# Patient Record
Sex: Female | Born: 1980 | Hispanic: No | Marital: Single | Smoking: Former smoker
Health system: Southern US, Community
[De-identification: ages and names within clinical notes are randomized; demographics above are authoritative.]

## PROBLEM LIST (undated history)

## (undated) ENCOUNTER — Emergency Department (HOSPITAL_COMMUNITY): Admission: EM | Source: Home / Self Care

## (undated) DIAGNOSIS — F419 Anxiety disorder, unspecified: Secondary | ICD-10-CM

## (undated) DIAGNOSIS — N289 Disorder of kidney and ureter, unspecified: Secondary | ICD-10-CM

## (undated) DIAGNOSIS — Z9119 Patient's noncompliance with other medical treatment and regimen: Secondary | ICD-10-CM

## (undated) DIAGNOSIS — I639 Cerebral infarction, unspecified: Secondary | ICD-10-CM

## (undated) DIAGNOSIS — Z992 Dependence on renal dialysis: Secondary | ICD-10-CM

## (undated) DIAGNOSIS — E109 Type 1 diabetes mellitus without complications: Secondary | ICD-10-CM

## (undated) DIAGNOSIS — N186 End stage renal disease: Secondary | ICD-10-CM

## (undated) DIAGNOSIS — Z91199 Patient's noncompliance with other medical treatment and regimen due to unspecified reason: Secondary | ICD-10-CM

## (undated) DIAGNOSIS — D649 Anemia, unspecified: Secondary | ICD-10-CM

## (undated) DIAGNOSIS — H547 Unspecified visual loss: Secondary | ICD-10-CM

## (undated) DIAGNOSIS — F1110 Opioid abuse, uncomplicated: Secondary | ICD-10-CM

## (undated) DIAGNOSIS — K3184 Gastroparesis: Secondary | ICD-10-CM

## (undated) DIAGNOSIS — I10 Essential (primary) hypertension: Secondary | ICD-10-CM

## (undated) HISTORY — PX: REFRACTIVE SURGERY: SHX103

## (undated) HISTORY — PX: OTHER SURGICAL HISTORY: SHX169

## (undated) HISTORY — PX: KIDNEY TRANSPLANT: SHX239

## (undated) HISTORY — PX: NEPHRECTOMY TRANSPLANTED ORGAN: SUR880

## (undated) HISTORY — PX: EYE SURGERY: SHX253

## (undated) HISTORY — DX: Unspecified visual loss: H54.7

---

## 2007-09-05 LAB — HM DIABETES FOOT EXAM

## 2007-11-05 ENCOUNTER — Ambulatory Visit: Payer: Self-pay | Admitting: Internal Medicine

## 2007-11-05 DIAGNOSIS — E1129 Type 2 diabetes mellitus with other diabetic kidney complication: Secondary | ICD-10-CM | POA: Insufficient documentation

## 2007-11-05 DIAGNOSIS — E1065 Type 1 diabetes mellitus with hyperglycemia: Secondary | ICD-10-CM

## 2007-11-05 LAB — CONVERTED CEMR LAB: Blood Glucose, Fingerstick: 600

## 2007-12-03 ENCOUNTER — Ambulatory Visit: Payer: Self-pay | Admitting: Internal Medicine

## 2007-12-03 DIAGNOSIS — IMO0002 Reserved for concepts with insufficient information to code with codable children: Secondary | ICD-10-CM

## 2007-12-03 LAB — CONVERTED CEMR LAB
Bilirubin Urine: NEGATIVE
Blood Glucose, Fingerstick: 600
Glucose, Urine, Semiquant: >=1000
Ketones, urine, test strip: NEGATIVE
Nitrite: NEGATIVE
Protein, U semiquant: NEGATIVE
Specific Gravity, Urine: <1.005
Urobilinogen, UA: 0.2
WBC Urine, dipstick: NEGATIVE
pH: 6

## 2007-12-04 ENCOUNTER — Ambulatory Visit: Payer: Self-pay | Admitting: Family Medicine

## 2007-12-04 LAB — CONVERTED CEMR LAB: Blood Glucose, Fingerstick: 447

## 2007-12-05 ENCOUNTER — Ambulatory Visit: Payer: Self-pay | Admitting: Internal Medicine

## 2007-12-05 LAB — CONVERTED CEMR LAB: Blood Glucose, Fingerstick: 278

## 2007-12-17 ENCOUNTER — Ambulatory Visit: Payer: Self-pay | Admitting: Internal Medicine

## 2007-12-17 DIAGNOSIS — M7700 Medial epicondylitis, unspecified elbow: Secondary | ICD-10-CM

## 2008-01-10 ENCOUNTER — Encounter (INDEPENDENT_AMBULATORY_CARE_PROVIDER_SITE_OTHER): Payer: Self-pay | Admitting: *Deleted

## 2008-09-03 ENCOUNTER — Emergency Department (HOSPITAL_COMMUNITY): Admission: EM | Admit: 2008-09-03 | Discharge: 2008-09-04 | Payer: Self-pay | Admitting: Emergency Medicine

## 2008-09-04 ENCOUNTER — Emergency Department (HOSPITAL_COMMUNITY): Admission: EM | Admit: 2008-09-04 | Discharge: 2008-09-04 | Payer: Self-pay | Admitting: Emergency Medicine

## 2008-11-16 ENCOUNTER — Emergency Department (HOSPITAL_COMMUNITY): Admission: EM | Admit: 2008-11-16 | Discharge: 2008-11-16 | Payer: Self-pay | Admitting: Emergency Medicine

## 2008-12-09 ENCOUNTER — Emergency Department (HOSPITAL_COMMUNITY): Admission: EM | Admit: 2008-12-09 | Discharge: 2008-12-09 | Payer: Self-pay | Admitting: Emergency Medicine

## 2009-05-31 ENCOUNTER — Observation Stay (HOSPITAL_COMMUNITY): Admission: EM | Admit: 2009-05-31 | Discharge: 2009-05-31 | Payer: Self-pay | Admitting: Emergency Medicine

## 2009-10-18 ENCOUNTER — Emergency Department (HOSPITAL_COMMUNITY): Admission: EM | Admit: 2009-10-18 | Discharge: 2009-10-18 | Payer: Self-pay | Admitting: Emergency Medicine

## 2009-12-19 ENCOUNTER — Emergency Department (HOSPITAL_COMMUNITY): Admission: EM | Admit: 2009-12-19 | Discharge: 2009-12-19 | Payer: Self-pay | Admitting: Emergency Medicine

## 2009-12-24 ENCOUNTER — Emergency Department (HOSPITAL_COMMUNITY): Admission: EM | Admit: 2009-12-24 | Discharge: 2009-12-25 | Payer: Self-pay | Admitting: Emergency Medicine

## 2010-02-01 ENCOUNTER — Emergency Department (HOSPITAL_COMMUNITY): Admission: EM | Admit: 2010-02-01 | Discharge: 2010-02-01 | Payer: Self-pay | Admitting: Emergency Medicine

## 2010-02-28 ENCOUNTER — Emergency Department (HOSPITAL_BASED_OUTPATIENT_CLINIC_OR_DEPARTMENT_OTHER): Admission: EM | Admit: 2010-02-28 | Discharge: 2010-03-01 | Payer: Self-pay | Admitting: Emergency Medicine

## 2010-03-28 ENCOUNTER — Emergency Department (HOSPITAL_COMMUNITY): Admission: EM | Admit: 2010-03-28 | Discharge: 2010-03-29 | Payer: Self-pay | Admitting: Emergency Medicine

## 2010-04-22 ENCOUNTER — Observation Stay (HOSPITAL_COMMUNITY): Admission: EM | Admit: 2010-04-22 | Discharge: 2010-04-22 | Payer: Self-pay | Admitting: Emergency Medicine

## 2010-04-22 IMAGING — CT CT ABD-PELV W/ CM
2 of 4 series · 17 of 46 positions shown, 19 images · IV contrast (APPLIED)
Comparison: Abdomen films of [DATE]

CLINICAL DATA: Sudden onset of lower abdomen and pelvic pain, the
patient is diabetic

CT ABDOMEN AND PELVIS WITH CONTRAST
TECHNIQUE: Multidetector CT imaging of the abdomen and pelvis was
performed following the standard protocol during bolus
administration of intravenous contrast.
Contrast: 80 ml [0K]

[Series 2: abd/pelv with 5.0 b31f st · axial · 0.71mm/px · z∈[+682,+1078]mm · 14 of 87 slices shown, 16 images]
[im 4/87  soft-tissue]
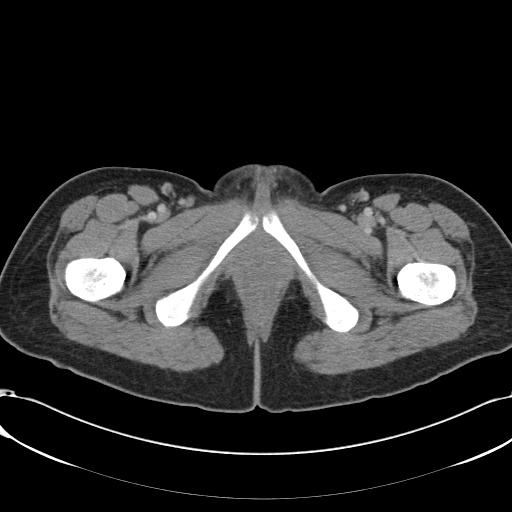
[im 4/87  bone]
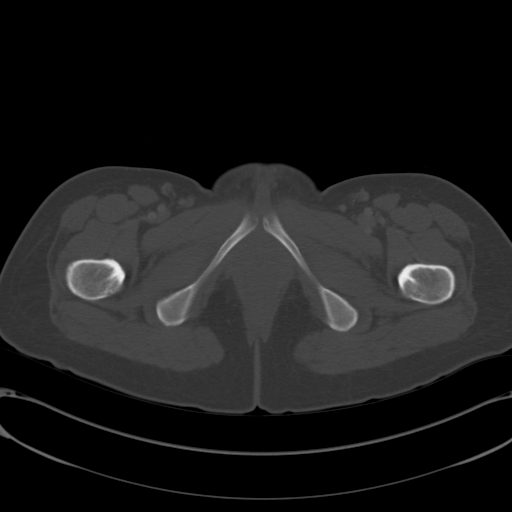
[im 11/87  soft-tissue]
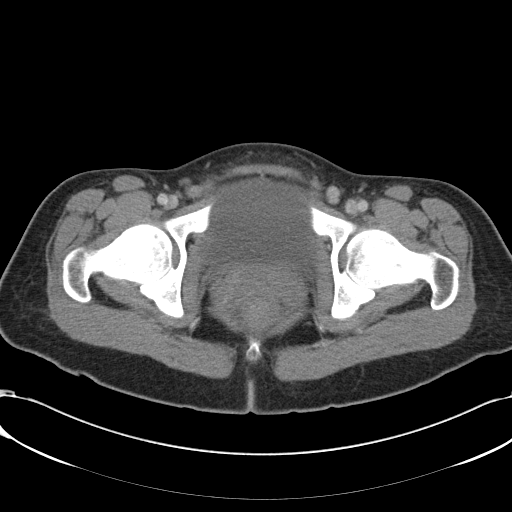
[im 18/87  soft-tissue]
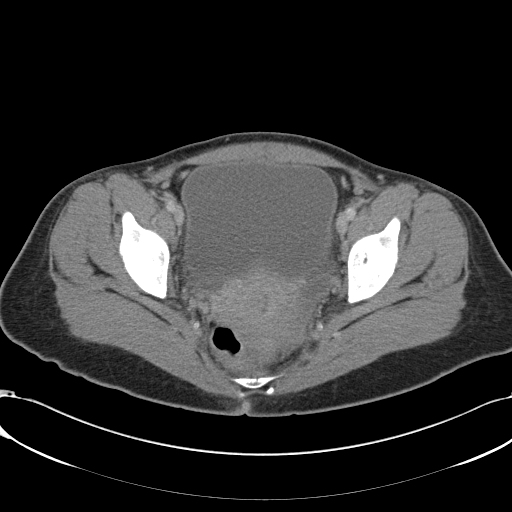
[im 25/87  soft-tissue]
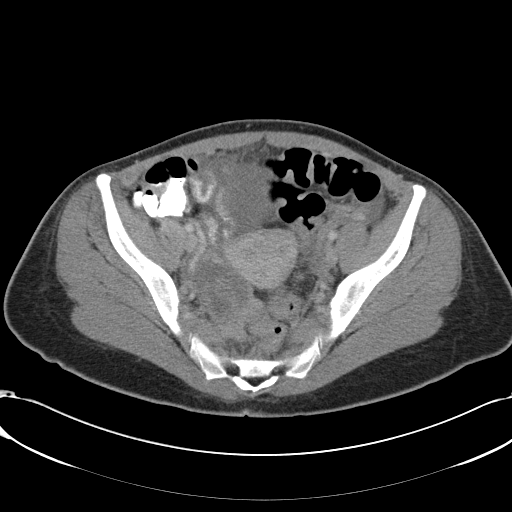
[im 28/87  soft-tissue]
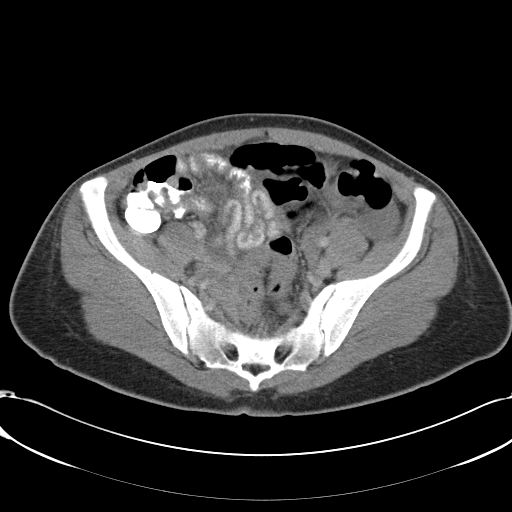
[im 35/87  soft-tissue]
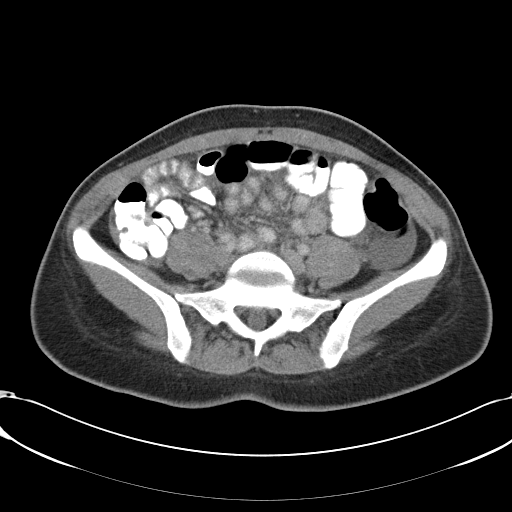
[im 42/87  soft-tissue]
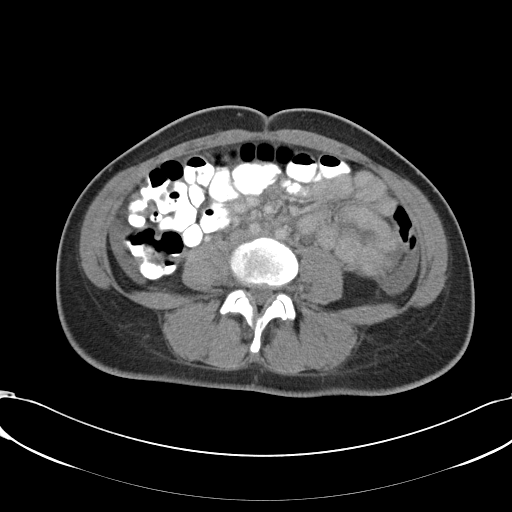
[im 45/87  soft-tissue]
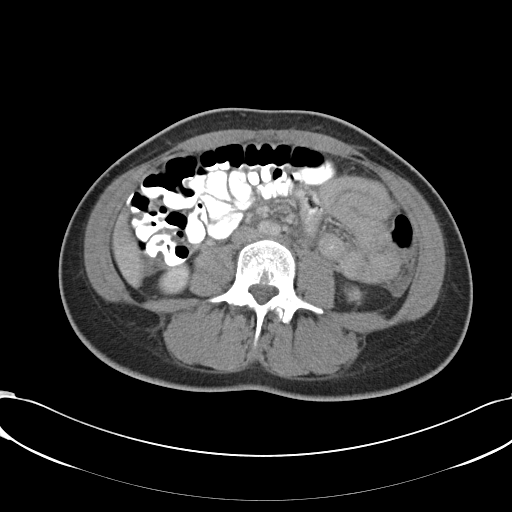
[im 52/87  soft-tissue]
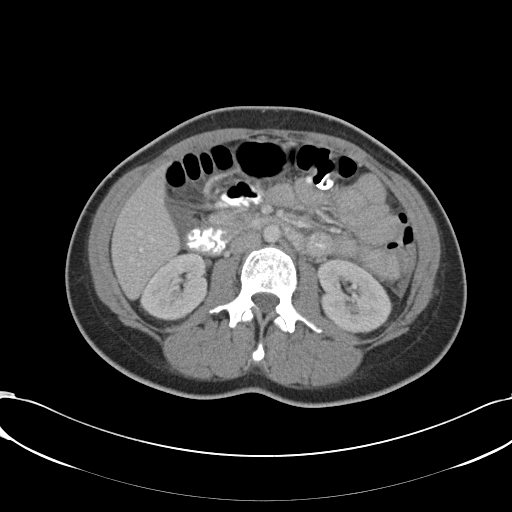
[im 52/87  bone]
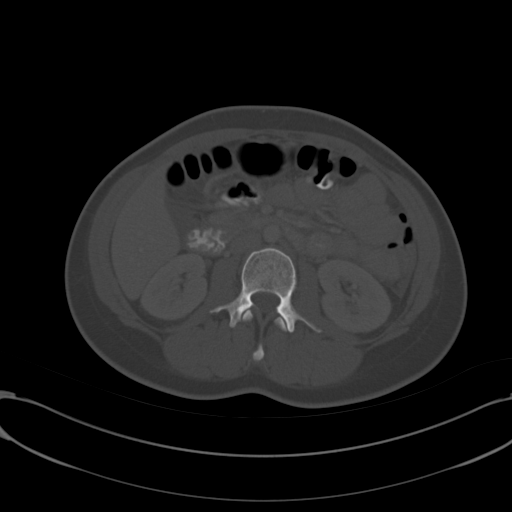
[im 59/87  soft-tissue]
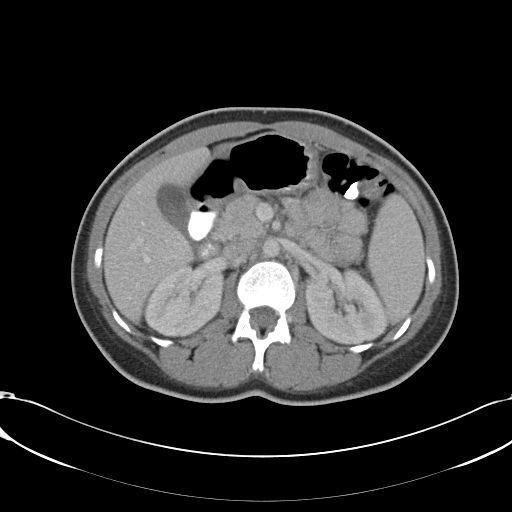
[im 66/87  soft-tissue]
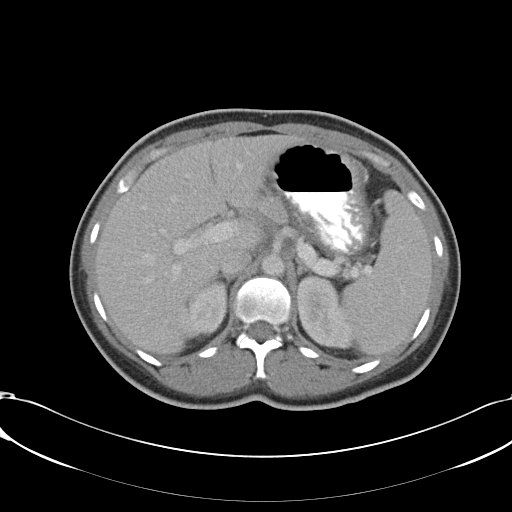
[im 69/87  soft-tissue]
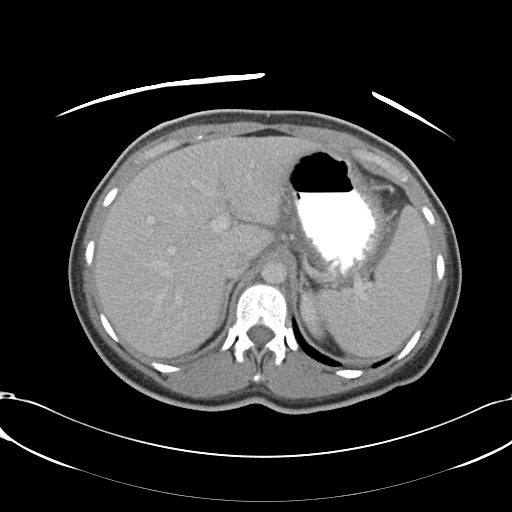
[im 76/87  soft-tissue]
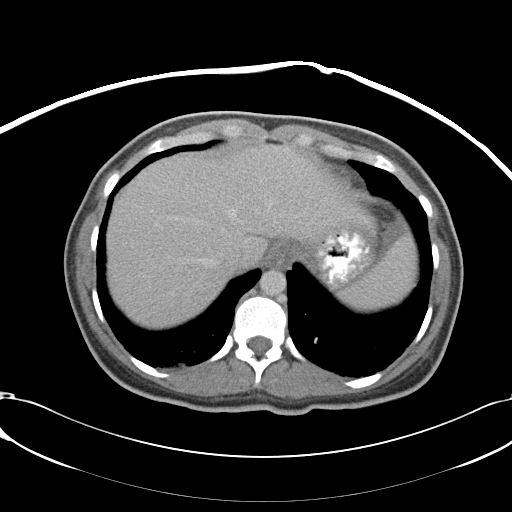
[im 83/87  soft-tissue]
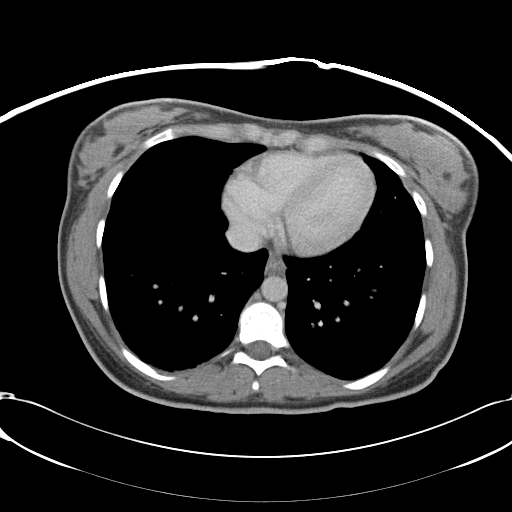

[Series 5: abd/pelv with 2.0 spo cor st · coronal · 0.90mm/px · 3 of 88 slices shown]
[im 30/88  soft-tissue]
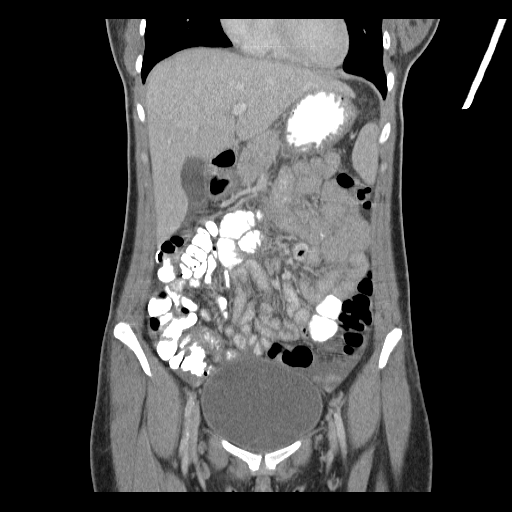
[im 39/88  soft-tissue]
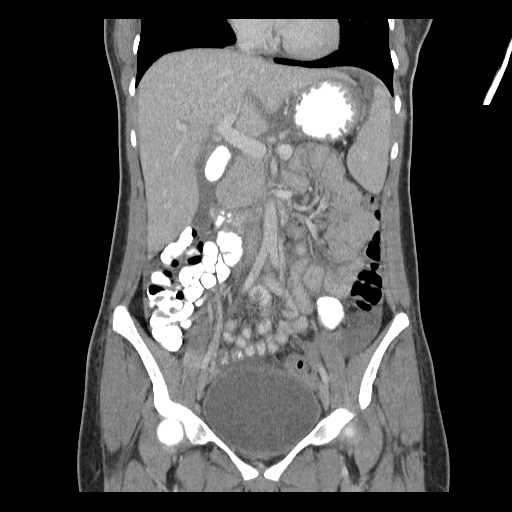
[im 49/88  soft-tissue]
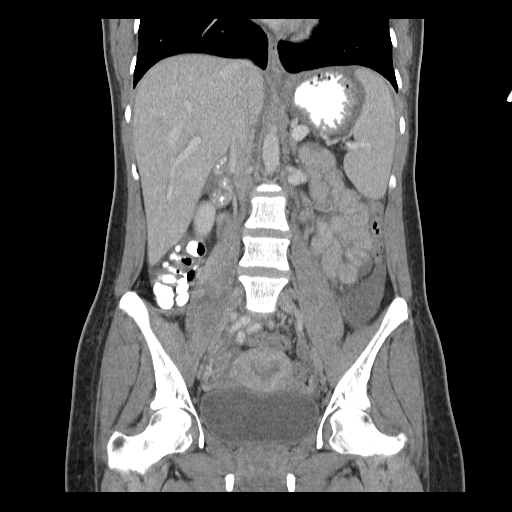

[17 of 46 positions shown; findings below may reference images not displayed]

FINDINGS: The lung bases are clear.  The liver enhances with no
focal abnormality and no ductal dilatation is seen.  The
gallbladder is visualized and no calcified gallstones are seen.
However, there is free fluid within the peritoneal cavity extending
around the liver and spleen and extending into the pelvis.  The
pancreas is somewhat truncated of normal with no evidence of ductal
dilatation.  The adrenal glands are unremarkable and the spleen is
within upper limits normal for age.  The kidneys enhance with no
calculus or mass and no hydronephrosis is seen.  The abdominal
aorta is normal in caliber.

There is free fluid within the pelvis.  There is a complex right
adnexal lesion present of approximately 36 x 610 mm.  This probably
represents collapsing right ovarian cyst.  However in view of
amount of fluid present, ectopic pregnancy should be excluded.  The
fluid may be due to a ruptured ovarian cyst, but more fluid is
present that is generally seen as result of the left ovarian cyst.
The uterus is normal in size.  The urinary bladder is unremarkable.
The appendix fills with air and there is no evidence of
appendicitis.  The terminal ileum appears normal.  No bony
abnormality is seen.
IMPRESSION: 1.  Probable ruptured complex right ovarian cyst with moderate
amount of free fluid in the pelvis and extending to the liver and
spleen.  Ectopic pregnancy should be excluded.
2.  The appendix is well seen and is normal as is the terminal
ileum.

## 2010-04-22 IMAGING — CR DG ABDOMEN ACUTE W/ 1V CHEST
3 series · 3 of 3 positions shown · non-contrast
Comparison: None.

CLINICAL DATA: Abdominal pain, nausea, vomiting, diarrhea, history
diabetes

ACUTE ABDOMEN SERIES (ABDOMEN 2 VIEW & CHEST 1 VIEW)

[w chest pa]
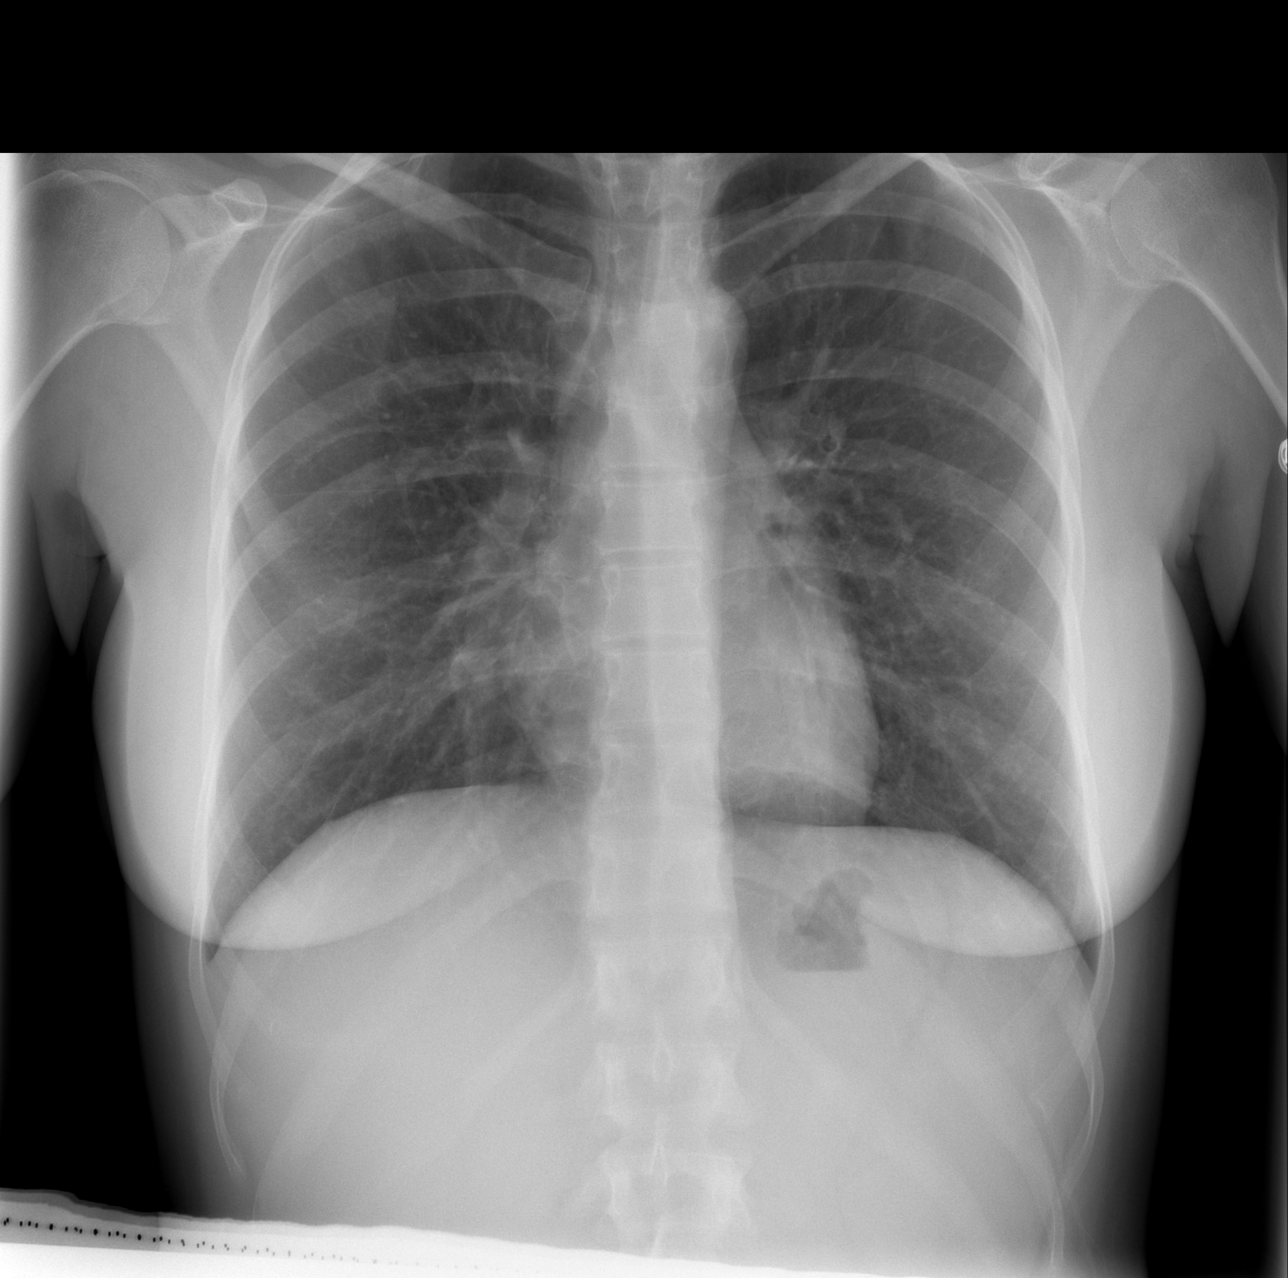

[w abdomen upright]
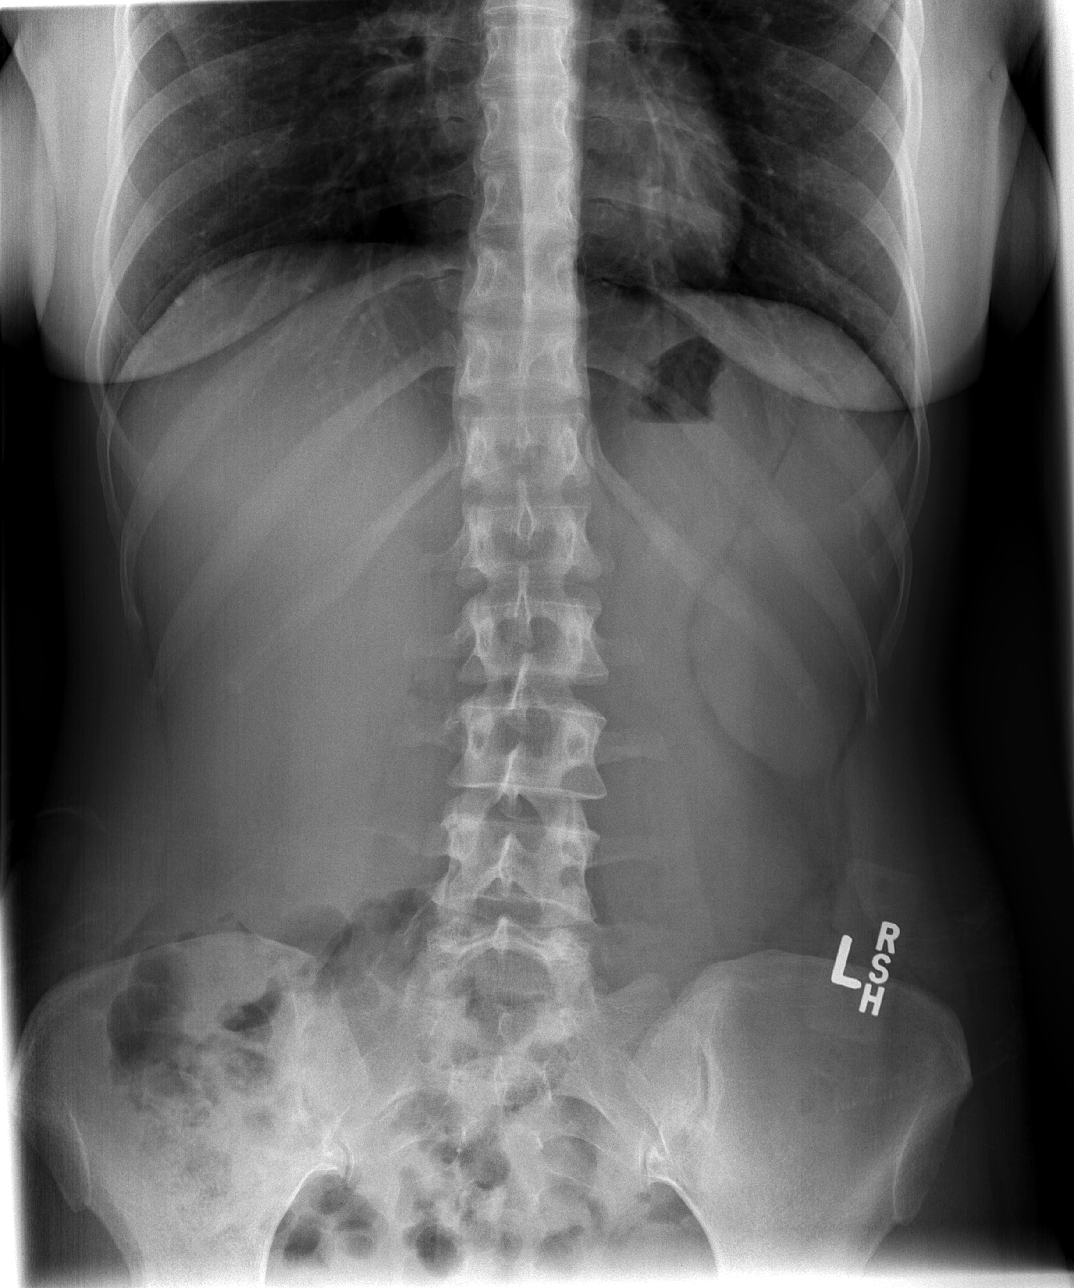

[t abdomen supine]
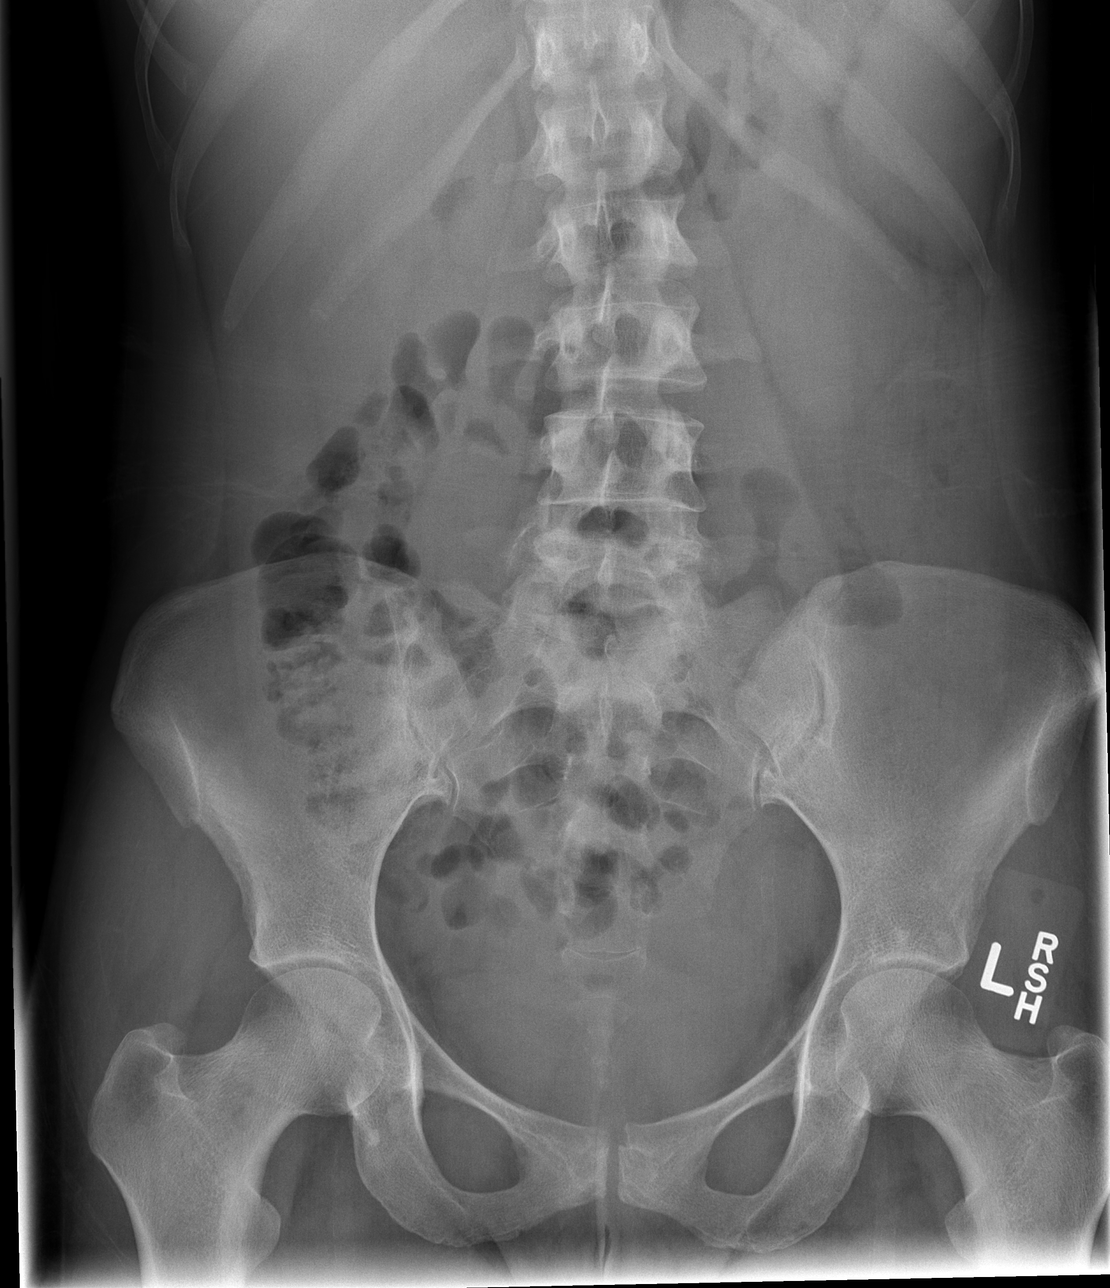

[3 of 3 positions shown; findings below may reference images not displayed]

FINDINGS: The lungs are clear.  Mediastinal contours are normal.
The heart is within normal limits in size.  No bony abnormality is
seen.

Supine and erect views of the abdomen show no bowel obstruction.
No free air is seen.  There is perhaps slight prominence of both
the liver and the spleen and clinical correlation is recommended.
No opaque calculi are seen.  No bony abnormality is noted.
IMPRESSION: 1.  No active lung disease.
2.  No bowel obstruction.  No free air.
3.  Question of prominence of the liver and spleen by plain film.
Correlate clinically.

## 2010-11-18 LAB — POCT I-STAT 3, ART BLOOD GAS (G3+)
Acid-base deficit: 1 mmol/L (ref 0.0–2.0)
Bicarbonate: 23.1 mEq/L (ref 20.0–24.0)
O2 Saturation: 97 %
TCO2: 24 mmol/L (ref 0–100)
pCO2 arterial: 36.7 mmHg (ref 35.0–45.0)
pH, Arterial: 7.407 — ABNORMAL HIGH (ref 7.350–7.400)
pO2, Arterial: 90 mmHg (ref 80.0–100.0)

## 2010-11-18 LAB — HEMOGLOBIN A1C: Hgb A1c MFr Bld: 10.8 % — ABNORMAL HIGH (ref <5.7)

## 2010-11-18 LAB — DIFFERENTIAL: Eosinophils Relative: 2 % (ref 0–5)

## 2010-11-18 LAB — BASIC METABOLIC PANEL
BUN: 34 mg/dL — ABNORMAL HIGH (ref 6–23)
CO2: 25 mEq/L (ref 19–32)
Chloride: 100 mEq/L (ref 96–112)
Creatinine, Ser: 1.15 mg/dL (ref 0.4–1.2)
GFR calc Af Amer: >60 mL/min (ref >60)
Potassium: 4 mEq/L (ref 3.5–5.1)
Sodium: 132 mEq/L — ABNORMAL LOW (ref 135–145)

## 2010-11-18 LAB — URINALYSIS, ROUTINE W REFLEX MICROSCOPIC
Bilirubin Urine: NEGATIVE
Nitrite: NEGATIVE
Protein, ur: 100 mg/dL — AB
pH: 7 (ref 5.0–8.0)

## 2010-11-18 LAB — GLUCOSE, CAPILLARY
Glucose-Capillary: 168 mg/dL — ABNORMAL HIGH (ref 70–99)
Glucose-Capillary: 233 mg/dL — ABNORMAL HIGH (ref 70–99)
Glucose-Capillary: 417 mg/dL — ABNORMAL HIGH (ref 70–99)
Glucose-Capillary: 590 mg/dL (ref 70–99)

## 2010-11-18 LAB — CBC
HCT: 28.6 % — ABNORMAL LOW (ref 36.0–46.0)
MCV: 81.7 fL (ref 78.0–100.0)
RBC: 3.5 MIL/uL — ABNORMAL LOW (ref 3.87–5.11)
RDW: 12.2 % (ref 11.5–15.5)
WBC: 9.6 10*3/uL (ref 4.0–10.5)

## 2010-11-18 LAB — URINE MICROSCOPIC-ADD ON

## 2010-11-18 LAB — PREGNANCY, URINE: Preg Test, Ur: NEGATIVE

## 2010-11-19 LAB — BASIC METABOLIC PANEL
GFR calc Af Amer: >60 mL/min (ref >60)
GFR calc non Af Amer: >60 mL/min (ref >60)
Sodium: 132 mEq/L — ABNORMAL LOW (ref 135–145)

## 2010-11-19 LAB — DIFFERENTIAL
Basophils Relative: 0 % (ref 0–1)
Eosinophils Absolute: 0.1 10*3/uL (ref 0.0–0.7)
Eosinophils Relative: 3 % (ref 0–5)
Monocytes Absolute: 0.3 10*3/uL (ref 0.1–1.0)

## 2010-11-19 LAB — URINALYSIS, ROUTINE W REFLEX MICROSCOPIC
Bilirubin Urine: NEGATIVE
Ketones, ur: NEGATIVE mg/dL
Leukocytes, UA: NEGATIVE
Protein, ur: 100 mg/dL — AB
Urobilinogen, UA: 0.2 mg/dL (ref 0.0–1.0)
pH: 6.5 (ref 5.0–8.0)

## 2010-11-19 LAB — CBC
Hemoglobin: 12.1 g/dL (ref 12.0–15.0)
MCH: 32 pg (ref 26.0–34.0)
MCHC: 35.4 g/dL (ref 30.0–36.0)
Platelets: 150 10*3/uL (ref 150–400)
RDW: 12.1 % (ref 11.5–15.5)

## 2010-11-19 LAB — GLUCOSE, CAPILLARY: Glucose-Capillary: 457 mg/dL — ABNORMAL HIGH (ref 70–99)

## 2010-11-19 LAB — URINE MICROSCOPIC-ADD ON

## 2010-11-20 LAB — DIFFERENTIAL
Basophils Absolute: 0 10*3/uL (ref 0.0–0.1)
Basophils Relative: 0 % (ref 0–1)
Eosinophils Absolute: 0.1 10*3/uL (ref 0.0–0.7)
Eosinophils Relative: 3 % (ref 0–5)
Monocytes Absolute: 0.3 10*3/uL (ref 0.1–1.0)

## 2010-11-20 LAB — BASIC METABOLIC PANEL
BUN: 17 mg/dL (ref 6–23)
Chloride: 104 mEq/L (ref 96–112)
Creatinine, Ser: 0.9 mg/dL (ref 0.4–1.2)
Glucose, Bld: 487 mg/dL — ABNORMAL HIGH (ref 70–99)
Potassium: 4.1 mEq/L (ref 3.5–5.1)

## 2010-11-20 LAB — GLUCOSE, CAPILLARY: Glucose-Capillary: 481 mg/dL — ABNORMAL HIGH (ref 70–99)

## 2010-11-20 LAB — URINALYSIS, ROUTINE W REFLEX MICROSCOPIC
Bilirubin Urine: NEGATIVE
Ketones, ur: 15 mg/dL — AB
Leukocytes, UA: NEGATIVE
Nitrite: NEGATIVE
Specific Gravity, Urine: 1.028 (ref 1.005–1.030)
Urobilinogen, UA: 0.2 mg/dL (ref 0.0–1.0)

## 2010-11-20 LAB — CBC
HCT: 31 % — ABNORMAL LOW (ref 36.0–46.0)
MCHC: 35.8 g/dL (ref 30.0–36.0)
MCV: 89.1 fL (ref 78.0–100.0)
RDW: 11.5 % (ref 11.5–15.5)

## 2010-11-20 LAB — URINE MICROSCOPIC-ADD ON

## 2010-11-20 LAB — PREGNANCY, URINE: Preg Test, Ur: NEGATIVE

## 2010-11-22 LAB — URINALYSIS, ROUTINE W REFLEX MICROSCOPIC
Bilirubin Urine: NEGATIVE
Bilirubin Urine: NEGATIVE
Glucose, UA: >1000 mg/dL — AB
Ketones, ur: NEGATIVE mg/dL
Nitrite: NEGATIVE
Nitrite: NEGATIVE
Specific Gravity, Urine: 1.035 — ABNORMAL HIGH (ref 1.005–1.030)
Urobilinogen, UA: 0.2 mg/dL (ref 0.0–1.0)
pH: 5.5 (ref 5.0–8.0)

## 2010-11-22 LAB — BLOOD GAS, VENOUS
FIO2: 0.21 %
pCO2, Ven: 47.5 mmHg (ref 45.0–50.0)
pH, Ven: 7.333 — ABNORMAL HIGH (ref 7.250–7.300)

## 2010-11-22 LAB — DIFFERENTIAL
Basophils Absolute: 0 10*3/uL (ref 0.0–0.1)
Basophils Relative: 0 % (ref 0–1)
Monocytes Relative: 5 % (ref 3–12)
Neutro Abs: 4.8 10*3/uL (ref 1.7–7.7)
Neutrophils Relative %: 65 % (ref 43–77)

## 2010-11-22 LAB — GLUCOSE, CAPILLARY
Glucose-Capillary: 535 mg/dL — ABNORMAL HIGH (ref 70–99)
Glucose-Capillary: >600 mg/dL (ref 70–99)

## 2010-11-22 LAB — URINE MICROSCOPIC-ADD ON

## 2010-11-22 LAB — POCT I-STAT, CHEM 8
Calcium, Ion: 1.18 mmol/L (ref 1.12–1.32)
Chloride: 103 mEq/L (ref 96–112)
HCT: 36 % (ref 36.0–46.0)
Hemoglobin: 12.2 g/dL (ref 12.0–15.0)

## 2010-11-22 LAB — POCT PREGNANCY, URINE: Preg Test, Ur: NEGATIVE

## 2010-11-22 LAB — BLOOD GAS, ARTERIAL
Bicarbonate: 21.4 mEq/L (ref 20.0–24.0)
FIO2: 0.21 %
TCO2: 19.3 mmol/L (ref 0–100)
pCO2 arterial: 31.9 mmHg — ABNORMAL LOW (ref 35.0–45.0)
pH, Arterial: 7.442 — ABNORMAL HIGH (ref 7.350–7.400)

## 2010-11-22 LAB — CBC
MCHC: 35.3 g/dL (ref 30.0–36.0)
RBC: 4.07 MIL/uL (ref 3.87–5.11)
RDW: 12.2 % (ref 11.5–15.5)

## 2010-11-22 LAB — BASIC METABOLIC PANEL
CO2: 25 mEq/L (ref 19–32)
Calcium: 8.8 mg/dL (ref 8.4–10.5)
Creatinine, Ser: 0.91 mg/dL (ref 0.4–1.2)
GFR calc Af Amer: >60 mL/min (ref >60)

## 2010-12-09 LAB — POCT I-STAT, CHEM 8
BUN: 21 mg/dL (ref 6–23)
Calcium, Ion: 1.2 mmol/L (ref 1.12–1.32)
HCT: 41 % (ref 36.0–46.0)
Hemoglobin: 13.9 g/dL (ref 12.0–15.0)
Sodium: 132 mEq/L — ABNORMAL LOW (ref 135–145)
TCO2: 26 mmol/L (ref 0–100)

## 2010-12-09 LAB — GLUCOSE, CAPILLARY
Glucose-Capillary: 240 mg/dL — ABNORMAL HIGH (ref 70–99)
Glucose-Capillary: 305 mg/dL — ABNORMAL HIGH (ref 70–99)

## 2010-12-09 LAB — URINE MICROSCOPIC-ADD ON

## 2010-12-09 LAB — URINALYSIS, ROUTINE W REFLEX MICROSCOPIC
Glucose, UA: >1000 mg/dL — AB
Leukocytes, UA: NEGATIVE
Protein, ur: 30 mg/dL — AB
pH: 5.5 (ref 5.0–8.0)

## 2010-12-09 LAB — POCT PREGNANCY, URINE: Preg Test, Ur: NEGATIVE

## 2010-12-14 LAB — URINALYSIS, ROUTINE W REFLEX MICROSCOPIC
Bilirubin Urine: NEGATIVE
Glucose, UA: 250 mg/dL — AB
Protein, ur: 100 mg/dL — AB
Urobilinogen, UA: 0.2 mg/dL (ref 0.0–1.0)

## 2010-12-14 LAB — CBC
Hemoglobin: 13.3 g/dL (ref 12.0–15.0)
MCHC: 35.7 g/dL (ref 30.0–36.0)
MCV: 90.2 fL (ref 78.0–100.0)
RBC: 4.12 MIL/uL (ref 3.87–5.11)
RDW: 12.2 % (ref 11.5–15.5)

## 2010-12-14 LAB — BASIC METABOLIC PANEL
CO2: 25 mEq/L (ref 19–32)
Chloride: 107 mEq/L (ref 96–112)
Creatinine, Ser: 0.57 mg/dL (ref 0.4–1.2)
GFR calc Af Amer: >60 mL/min (ref >60)
Glucose, Bld: 212 mg/dL — ABNORMAL HIGH (ref 70–99)
Sodium: 138 mEq/L (ref 135–145)

## 2010-12-14 LAB — DIFFERENTIAL
Basophils Absolute: 0 10*3/uL (ref 0.0–0.1)
Basophils Relative: 1 % (ref 0–1)
Eosinophils Absolute: 0.1 10*3/uL (ref 0.0–0.7)
Monocytes Absolute: 0.3 10*3/uL (ref 0.1–1.0)
Monocytes Relative: 4 % (ref 3–12)

## 2010-12-14 LAB — GC/CHLAMYDIA PROBE AMP, GENITAL: Chlamydia, DNA Probe: NEGATIVE

## 2010-12-14 LAB — URINE MICROSCOPIC-ADD ON

## 2010-12-14 LAB — PREGNANCY, URINE: Preg Test, Ur: NEGATIVE

## 2010-12-14 LAB — GLUCOSE, CAPILLARY: Glucose-Capillary: 200 mg/dL — ABNORMAL HIGH (ref 70–99)

## 2011-03-30 ENCOUNTER — Telehealth: Payer: Self-pay | Admitting: *Deleted

## 2011-03-30 NOTE — Telephone Encounter (Signed)
Pt scheduled for 04/11/11 at 08:00am w/Dr Loanne Drilling; DM, Type I  Per conversation w/social worker from [facility]: 1) Last seen by Dr Mack Hook 12/2007 2) Pt taking Humulin 75/25, maintains supply of Insulin via hospital 3) Pt sees Dr. Baird Cancer for Opthalmology services 4) Caller does not know when Pt has last had labs or checked CBGs [will inquire w/patient]  5) Medical services are paid for through [facility] [pay Medicaid rates]; caller states that they have negotiated agreement with [facility] and will supply with previous patients names so that we can further assist by researching this billing status. [informed that unless told otherwise by billing, that Pt will be "self pay" at time of check-in and will be responsible for payment at that time; which she will receive receipt to submit for reimbursement]

## 2011-04-04 LAB — HM DIABETES EYE EXAM

## 2011-04-10 ENCOUNTER — Encounter: Payer: Self-pay | Admitting: *Deleted

## 2011-04-11 ENCOUNTER — Ambulatory Visit (INDEPENDENT_AMBULATORY_CARE_PROVIDER_SITE_OTHER): Payer: Self-pay | Admitting: Endocrinology

## 2011-04-11 ENCOUNTER — Encounter: Payer: Self-pay | Admitting: Endocrinology

## 2011-04-11 VITALS — BP 142/94 | HR 93 | Temp 98.1°F | Ht 62.0 in | Wt 126.0 lb

## 2011-04-11 DIAGNOSIS — E1065 Type 1 diabetes mellitus with hyperglycemia: Secondary | ICD-10-CM

## 2011-04-11 MED ORDER — INSULIN PEN NEEDLE 31G X 8 MM MISC: 1.0000 | Freq: Two times a day (BID) | Status: DC

## 2011-04-11 NOTE — Progress Notes (Signed)
Subjective:    Patient ID: [name] [name], female    DOB: [birth year], 30 y.o.   MRN: [id number]  HPI pt states 15 years h/o dm.  it is complicated by proliferative retinopathy.  she has been on insulin since dx.  Insulin dosage is noted.  no cbg record, but states she checks cbg "almost every day."  She has hypoglycemia approx 1/week.  It happens in the early am.  She is unaware of any other trend of the cbg throughout the day.  pt says her diet and exercise are "ok."  She reports few mos of slight "stiffness," worst at the right thigh, only in the context of exertion, but no assoc numbness.   Past Medical History  Diagnosis Date  . DIABETES MELLITUS, TYPE I, UNCONTROLLED 11/05/2007  . MEDIAL EPICONDYLITIS, LEFT 12/17/2007  . Vision loss     Past Surgical History  Procedure Date  . Refractive surgery     History   Social History  . Marital Status: Single    Spouse Name: N/A    Number of Children: N/A  . Years of Education: N/A   Occupational History  . Not on file.   Social History Main Topics  . Smoking status: Never Smoker   . Smokeless tobacco: Not on file  . Alcohol Use: Yes  . Drug Use: No  . Sexually Active: Not on file   Other Topics Concern  . Not on file   Social History Narrative   Worked for [employer]--as did husband. Had to leave [place] for safety reasons. Has been in [place] since 9/09.    No current outpatient prescriptions on file prior to visit.    No Known Allergies  Family History  Problem Relation Age of Onset  . Hypertension Father     BP 142/94  Pulse 93  Temp(Src) 98.1 F (36.7 C) (Oral)  Ht 5\' 2"  (1.575 m)  Wt 126 lb (57.153 kg)  BMI 23.05 kg/m2  SpO2 97%     Review of Systems denies headache, chest pain, n/v, urinary frequency, cramps, excessive diaphoresis, memory loss, depression, hypoglycemia, and rhinorrhea.  She has lost 25 lbs, x a few mos.  She has chronically poor vision, easy bruising, and fatigue.      Objective:   Physical  Exam VS: see vs page GEN: no distress HEAD: head: no deformity eyes: no periorbital swelling, no proptosis external nose and ears are normal mouth: no lesion seen NECK: supple, thyroid is slightly enlarged on the left, but no palpable nodule.   CHEST WALL: no deformity CV: reg rate and rhythm, no murmur ABD: abdomen is soft, nontender.  no hepatosplenomegaly.  not distended.  no hernia MUSCULOSKELETAL: muscle bulk and strength are grossly normal.  no obvious joint swelling.  gait is normal and steady.  The right thigh is nontender. EXTEMITIES: no deformity.  no ulcer on the feet.  feet are of normal color and temp.  no edema. PULSES: dorsalis pedis intact bilat.  no carotid bruit. NEURO:  cn 2-12 grossly intact.   readily moves all 4's.  sensation is intact to touch on the feet. SKIN:  Normal texture and temperature.  No rash or suspicious lesion is visible. There are many healing insect bites on the legs. At the insulin injection sites at the triceps areas there is slight hypertrophy of sq tissue. NODES:  None palpable at the neck. PSYCH: alert, oriented x3.  Does not appear anxious nor depressed.   Lab Results  Component Value  Date   HGBA1C  Value: 10.8 (NOTE)                                                                       According to the ADA Clinical Practice Recommendations for 2011, when HbA1c is used as a screening test:   >=6.5%   Diagnostic of Diabetes Mellitus           (if abnormal result  is confirmed)  5.7-6.4%   Increased risk of developing Diabetes Mellitus  References:Diagnosis and Classification of Diabetes Mellitus,Diabetes [id number] 1):S62-S69 and Standards of Medical Care in         Diabetes - 2011,Diabetes [id number]  (Suppl 1):S11-S61.* 04/22/2010      Assessment & Plan:  Type 1 dm, prob poor control.  However, we first need to reduce the insulin to eliminate hypoglycemia. Thigh sxs, uncertain etiology.   Occasionally this is caused by mononeuropathy  due to dm Weight loss, prob due to dm Hypertrophy of sq tissue, due to insulin injections

## 2011-04-11 NOTE — Patient Instructions (Addendum)
good diet and exercise habits significanly improve the control of your diabetes.  please let me know if you wish to be referred to a dietician.  high blood sugar is very risky to your health.  you should see an eye doctor every year. controlling your blood pressure and cholesterol drastically reduces the damage diabetes does to your body.  this also applies to quitting smoking.  please discuss these with your doctor.   check your blood sugar 1 time a day.  vary the time of day when you check, between before the 3 meals, and at bedtime.  also check if you have symptoms of your blood sugar being too high or too low.  please keep a record of the readings and bring it to your next appointment here.  please call us sooner if you are having low blood sugar episodes. In view of your medical condition, you should avoid pregnancy until we have decided it is safe For the best price, you should get your meter and trips from [place] or [employer]. For now, reduce insulin to 10 units with breakfast, and 18 units with the evening meal. Please make a follow-up appointment in 2 weeks. blood tests are being ordered for you today.  please call [phone] to hear your test results.  You will be prompted to enter the 9-digit "MRN" number that appears at the top left of this page, followed by #.  Then you will hear the message. (addendum:  Pt was advised to avoid injecting into the areas of hypertrophic sq tissue on the arms).

## 2011-04-25 ENCOUNTER — Ambulatory Visit (INDEPENDENT_AMBULATORY_CARE_PROVIDER_SITE_OTHER): Payer: Self-pay | Admitting: Endocrinology

## 2011-04-25 ENCOUNTER — Encounter: Payer: Self-pay | Admitting: Endocrinology

## 2011-04-25 DIAGNOSIS — E1065 Type 1 diabetes mellitus with hyperglycemia: Secondary | ICD-10-CM

## 2011-04-25 DIAGNOSIS — IMO0002 Reserved for concepts with insufficient information to code with codable children: Secondary | ICD-10-CM

## 2011-04-25 NOTE — Progress Notes (Signed)
  Subjective:    Patient ID: [name] [name], female    DOB: June 06, 1981, 30 y.o.   MRN: [id number]  HPI Pt says despite the decrease of her insulin, she continues to have almost daily hypoglycemia, at any time of day.  Pt states few years of moderate spotty hyperpigmentation at the legs, in the context of insect bites. Past Medical History  Diagnosis Date  . DIABETES MELLITUS, TYPE I, UNCONTROLLED 11/05/2007  . MEDIAL EPICONDYLITIS, LEFT 12/17/2007  . Vision loss     Past Surgical History  Procedure Date  . Refractive surgery     History   Social History  . Marital Status: Single    Spouse Name: N/A    Number of Children: N/A  . Years of Education: N/A   Occupational History  . Not on file.   Social History Main Topics  . Smoking status: Never Smoker   . Smokeless tobacco: Not on file  . Alcohol Use: Yes  . Drug Use: No  . Sexually Active: Not on file   Other Topics Concern  . Not on file   Social History Narrative   Worked for [employer]--as did husband. Had to leave [place] for safety reasons. Has been in [place] since 9/09.    Current Outpatient Prescriptions on File Prior to Visit  Medication Sig Dispense Refill  . insulin lispro protamine-insulin lispro (HUMALOG 75/25) (75-25) 100 UNIT/ML SUSP Inject into the skin. 8 units with breakfast, and 16 units with evening meal.      . Insulin Pen Needle 31G X 8 MM MISC 1 each by Does not apply route 2 (two) times daily.  60 each  11   No Known Allergies  Family History  Problem Relation Age of Onset  . Hypertension Father    BP 138/92  Pulse 90  Temp(Src) 98.6 F (37 C) (Oral)  Ht 5\' 1"  (1.549 m)  Wt 127 lb 9.6 oz (57.879 kg)  BMI 24.11 kg/m2  SpO2 97%  Review of Systems Denies loc.      Objective:   Physical Exam GENERAL: no distress PSYCH: Alert and oriented x 3.  Does not appear anxious nor depressed.  Skin:  There are multiple hyperpigmented macules on the legs.    Assessment & Plan:  Type 1 dm, still  overcontrolled. Hyperpigmentation, at the site of insect bites.

## 2011-04-25 NOTE — Patient Instructions (Addendum)
check your blood sugar 1 time a day.  vary the time of day when you check, between before the 3 meals, and at bedtime.  also check if you have symptoms of your blood sugar being too high or too low.  please keep a record of the readings and bring it to your next appointment here.  please call us sooner if you are having low blood sugar episodes. For now, reduce insulin to 8 units with breakfast, and 16 units with the evening meal. Please make a follow-up appointment in 1 month.   Here are some ways you can prevent the spots on your legs:  Wear long pants, use insect repellent, and apply store-brand hydrocortisone cream when a bite happens.  You can also try to treat the spots with a skin-bleaching product from the drugstore. For your eye surgery tomorrow: take only 10 units of insulin this evening.  Tomorrow morning, take 6 units, when you eat breakfast, after your surgery.  Then tomorrow evening, resume your usual insulin amounts.

## 2011-05-26 ENCOUNTER — Ambulatory Visit (INDEPENDENT_AMBULATORY_CARE_PROVIDER_SITE_OTHER): Payer: Self-pay | Admitting: Endocrinology

## 2011-05-26 ENCOUNTER — Encounter: Payer: Self-pay | Admitting: Endocrinology

## 2011-05-26 VITALS — BP 142/82 | HR 109 | Temp 98.3°F | Ht 62.0 in | Wt 129.8 lb

## 2011-05-26 DIAGNOSIS — IMO0002 Reserved for concepts with insufficient information to code with codable children: Secondary | ICD-10-CM

## 2011-05-26 DIAGNOSIS — E1065 Type 1 diabetes mellitus with hyperglycemia: Secondary | ICD-10-CM

## 2011-05-26 NOTE — Patient Instructions (Addendum)
check your blood sugar 1 time a day.  vary the time of day when you check, between before the 3 meals, and at bedtime.  also check if you have symptoms of your blood sugar being too high or too low.  please keep a record of the readings and bring it to your next appointment here.  please call us sooner if you are having low blood sugar episodes. For now, continue insulin 8 units with breakfast, and 16 units with the evening meal. Please make a follow-up appointment in January.   Please see dr Amil Amen, so see about your blood pressure.   Please do the a1c blood test in November.  The lab will expect you.  Then please call [phone] to hear your test results.  You will be prompted to enter the 9-digit "MRN" number that appears at the top left of this page, followed by #.  Then you will hear the message.

## 2011-05-26 NOTE — Progress Notes (Signed)
  Subjective:    Patient ID: [name] [name], female    DOB: 1981/04/30, 30 y.o.   MRN: [id number]  HPI pt states she feels well in general.  no cbg record, but states cbg's are well-controlled.  She says cbg is in general higher as the day goes on.   Past Medical History  Diagnosis Date  . DIABETES MELLITUS, TYPE I, UNCONTROLLED 11/05/2007  . MEDIAL EPICONDYLITIS, LEFT 12/17/2007  . Vision loss     Past Surgical History  Procedure Date  . Refractive surgery     History   Social History  . Marital Status: Single    Spouse Name: N/A    Number of Children: N/A  . Years of Education: N/A   Occupational History  . Not on file.   Social History Main Topics  . Smoking status: Never Smoker   . Smokeless tobacco: Not on file  . Alcohol Use: Yes  . Drug Use: No  . Sexually Active: Not on file   Other Topics Concern  . Not on file   Social History Narrative   Worked for [employer]--as did husband. Had to leave [place] for safety reasons. Has been in [place] since 9/09.    Current Outpatient Prescriptions on File Prior to Visit  Medication Sig Dispense Refill  . insulin lispro protamine-insulin lispro (HUMALOG 75/25) (75-25) 100 UNIT/ML SUSP Inject into the skin. 8 units with breakfast, and 16 units with evening meal.      . Insulin Pen Needle 31G X 8 MM MISC 1 each by Does not apply route 2 (two) times daily.  60 each  11    No Known Allergies  Family History  Problem Relation Age of Onset  . Hypertension Father    BP 142/82  Pulse 109  Temp(Src) 98.3 F (36.8 C) (Oral)  Ht 5\' 2"  (1.575 m)  Wt 129 lb 12.8 oz (58.877 kg)  BMI 23.74 kg/m2  SpO2 98%  LMP 05/11/2011  Review of Systems denies hypoglycemia    Objective:   Physical Exam Pulses: dorsalis pedis intact bilat.   Feet: no deformity.  no ulcer on the feet.  feet are of normal color and temp.  Trace bilat leg edema.  There is bilateral onychomycosis Neuro: sensation is intact to touch on the feet.     Assessment & Plan:   Dm, with improved control Htn, with ? Of situational component.

## 2011-06-09 LAB — URINALYSIS, ROUTINE W REFLEX MICROSCOPIC
Leukocytes, UA: NEGATIVE
Nitrite: NEGATIVE
Protein, ur: 30 mg/dL — AB
Urobilinogen, UA: 0.2 mg/dL (ref 0.0–1.0)

## 2011-06-09 LAB — GC/CHLAMYDIA PROBE AMP, GENITAL
Chlamydia, DNA Probe: NEGATIVE
GC Probe Amp, Genital: NEGATIVE

## 2011-06-09 LAB — GLUCOSE, CAPILLARY: Glucose-Capillary: 330 mg/dL — ABNORMAL HIGH (ref 70–99)

## 2011-06-09 LAB — WET PREP, GENITAL

## 2011-06-09 LAB — URINE MICROSCOPIC-ADD ON

## 2011-08-02 ENCOUNTER — Ambulatory Visit: Payer: PRIVATE HEALTH INSURANCE | Attending: Internal Medicine | Admitting: Occupational Therapy

## 2011-09-15 ENCOUNTER — Other Ambulatory Visit (HOSPITAL_COMMUNITY): Payer: Self-pay | Admitting: Nephrology

## 2011-09-15 ENCOUNTER — Encounter (HOSPITAL_COMMUNITY)
Admission: RE | Admit: 2011-09-15 | Discharge: 2011-09-15 | Disposition: A | Discharge status: Home / Self Care | Payer: PRIVATE HEALTH INSURANCE | Source: Ambulatory Visit | Attending: Nephrology | Admitting: Nephrology

## 2011-09-15 DIAGNOSIS — N184 Chronic kidney disease, stage 4 (severe): Secondary | ICD-10-CM | POA: Insufficient documentation

## 2011-09-15 DIAGNOSIS — D638 Anemia in other chronic diseases classified elsewhere: Secondary | ICD-10-CM | POA: Insufficient documentation

## 2011-09-15 LAB — IRON AND TIBC
Iron: 30 ug/dL — ABNORMAL LOW (ref 42–135)
Saturation Ratios: 11 % — ABNORMAL LOW (ref 20–55)
TIBC: 270 ug/dL (ref 250–470)

## 2011-09-15 LAB — RENAL FUNCTION PANEL
Albumin: 2.6 g/dL — ABNORMAL LOW (ref 3.5–5.2)
BUN: 40 mg/dL — ABNORMAL HIGH (ref 6–23)
CO2: 25 mEq/L (ref 19–32)
Chloride: 107 mEq/L (ref 96–112)
Creatinine, Ser: 3.69 mg/dL — ABNORMAL HIGH (ref 0.50–1.10)
Potassium: 4.2 mEq/L (ref 3.5–5.1)

## 2011-09-15 LAB — MAGNESIUM: Magnesium: 2.2 mg/dL (ref 1.5–2.5)

## 2011-09-15 LAB — FERRITIN: Ferritin: 14 ng/mL (ref 10–291)

## 2011-09-15 MED ORDER — EPOETIN ALFA 10000 UNIT/ML IJ SOLN
10000.0000 [IU] | INTRAMUSCULAR | Status: DC
Start: 2011-09-15 — End: 2011-09-16

## 2011-09-15 MED ORDER — EPOETIN ALFA 10000 UNIT/ML IJ SOLN
INTRAMUSCULAR | Status: AC
  Administered 2011-09-15: 10000 [IU] via SUBCUTANEOUS
  Filled 2011-09-15: qty 1

## 2011-09-22 ENCOUNTER — Encounter (HOSPITAL_COMMUNITY)
Admission: RE | Admit: 2011-09-22 | Discharge: 2011-09-22 | Disposition: A | Discharge status: Home / Self Care | Payer: PRIVATE HEALTH INSURANCE | Source: Ambulatory Visit | Attending: Nephrology | Admitting: Nephrology

## 2011-09-22 MED ORDER — EPOETIN ALFA 10000 UNIT/ML IJ SOLN
10000.0000 [IU] | INTRAMUSCULAR | Status: DC
  Administered 2011-09-22: 10000 [IU] via SUBCUTANEOUS

## 2011-09-22 MED ORDER — EPOETIN ALFA 10000 UNIT/ML IJ SOLN
INTRAMUSCULAR | Status: AC
  Administered 2011-09-22: 10000 [IU] via SUBCUTANEOUS
  Filled 2011-09-22: qty 1

## 2011-09-22 NOTE — Progress Notes (Signed)
Pt's hbg 7.3 today, last week was 7.4.  Called and spoke with Alinda Sierras regarding results and told her the patient had already left and i had given her her shot and she comes weekly.  I faxed the labs to Dr. Posey Pronto. No further orders received.

## 2011-09-26 ENCOUNTER — Ambulatory Visit: Payer: Self-pay | Admitting: Endocrinology

## 2011-09-26 DIAGNOSIS — Z0289 Encounter for other administrative examinations: Secondary | ICD-10-CM

## 2011-09-29 ENCOUNTER — Encounter (HOSPITAL_COMMUNITY): Admission: RE | Admit: 2011-09-29 | Payer: PRIVATE HEALTH INSURANCE | Source: Ambulatory Visit

## 2011-10-06 ENCOUNTER — Encounter (HOSPITAL_COMMUNITY): Payer: PRIVATE HEALTH INSURANCE

## 2011-10-06 DIAGNOSIS — N184 Chronic kidney disease, stage 4 (severe): Secondary | ICD-10-CM | POA: Insufficient documentation

## 2011-10-06 DIAGNOSIS — D638 Anemia in other chronic diseases classified elsewhere: Secondary | ICD-10-CM | POA: Insufficient documentation

## 2011-10-09 ENCOUNTER — Other Ambulatory Visit (HOSPITAL_COMMUNITY): Payer: Self-pay | Admitting: *Deleted

## 2011-10-13 ENCOUNTER — Encounter (HOSPITAL_COMMUNITY)
Admission: RE | Admit: 2011-10-13 | Discharge: 2011-10-13 | Disposition: A | Discharge status: Home / Self Care | Payer: PRIVATE HEALTH INSURANCE | Source: Ambulatory Visit | Attending: Nephrology | Admitting: Nephrology

## 2011-10-13 ENCOUNTER — Encounter (HOSPITAL_COMMUNITY): Payer: PRIVATE HEALTH INSURANCE

## 2011-10-13 LAB — RENAL FUNCTION PANEL
Albumin: 2.7 g/dL — ABNORMAL LOW (ref 3.5–5.2)
BUN: 52 mg/dL — ABNORMAL HIGH (ref 6–23)
Chloride: 112 mEq/L (ref 96–112)
Creatinine, Ser: 4.63 mg/dL — ABNORMAL HIGH (ref 0.50–1.10)
Glucose, Bld: 78 mg/dL (ref 70–99)
Phosphorus: 5.8 mg/dL — ABNORMAL HIGH (ref 2.3–4.6)
Potassium: 4 mEq/L (ref 3.5–5.1)

## 2011-10-13 LAB — IRON AND TIBC
Iron: 34 ug/dL — ABNORMAL LOW (ref 42–135)
TIBC: 263 ug/dL (ref 250–470)
UIBC: 229 ug/dL (ref 125–400)

## 2011-10-13 LAB — MAGNESIUM: Magnesium: 2.2 mg/dL (ref 1.5–2.5)

## 2011-10-13 LAB — FERRITIN: Ferritin: 13 ng/mL (ref 10–291)

## 2011-10-13 MED ORDER — EPOETIN ALFA 10000 UNIT/ML IJ SOLN: 10000.0000 [IU] | INTRAMUSCULAR | Status: DC

## 2011-10-13 MED ORDER — EPOETIN ALFA 20000 UNIT/ML IJ SOLN
20000.0000 [IU] | Freq: Once | INTRAMUSCULAR | Status: AC
  Administered 2011-10-13: 20000 [IU] via SUBCUTANEOUS

## 2011-10-13 MED ORDER — EPOETIN ALFA 20000 UNIT/ML IJ SOLN
INTRAMUSCULAR | Status: AC
  Administered 2011-10-13: 20000 [IU] via SUBCUTANEOUS
  Filled 2011-10-13: qty 1

## 2011-10-13 MED ORDER — FERUMOXYTOL INJECTION 510 MG/17 ML
510.0000 mg | INTRAVENOUS | Status: DC
  Administered 2011-10-13: 510 mg via INTRAVENOUS
  Filled 2011-10-13: qty 17

## 2011-10-13 NOTE — Progress Notes (Signed)
Called Dr. Deterding's office called with Hemocue results. CMA to call back.

## 2011-10-20 ENCOUNTER — Encounter (HOSPITAL_COMMUNITY)
Admission: RE | Admit: 2011-10-20 | Discharge: 2011-10-20 | Disposition: A | Discharge status: Home / Self Care | Payer: PRIVATE HEALTH INSURANCE | Source: Ambulatory Visit | Attending: Nephrology | Admitting: Nephrology

## 2011-10-20 ENCOUNTER — Encounter (HOSPITAL_COMMUNITY): Payer: PRIVATE HEALTH INSURANCE

## 2011-10-20 LAB — POCT HEMOGLOBIN-HEMACUE: Hemoglobin: 9.6 g/dL — ABNORMAL LOW (ref 12.0–15.0)

## 2011-10-20 MED ORDER — EPOETIN ALFA 20000 UNIT/ML IJ SOLN
INTRAMUSCULAR | Status: AC
  Administered 2011-10-20: 20000 [IU] via SUBCUTANEOUS
  Filled 2011-10-20: qty 1

## 2011-10-20 MED ORDER — CLONIDINE HCL 0.1 MG PO TABS
ORAL_TABLET | ORAL | Status: AC
  Administered 2011-10-20: 0.1 mg via ORAL
  Filled 2011-10-20: qty 1

## 2011-10-20 MED ORDER — FERUMOXYTOL INJECTION 510 MG/17 ML
510.0000 mg | INTRAVENOUS | Status: AC
  Administered 2011-10-20: 510 mg via INTRAVENOUS

## 2011-10-20 MED ORDER — EPOETIN ALFA 10000 UNIT/ML IJ SOLN: 20000.0000 [IU] | INTRAMUSCULAR | Status: DC

## 2011-10-20 MED ORDER — FERUMOXYTOL INJECTION 510 MG/17 ML
INTRAVENOUS | Status: AC
  Administered 2011-10-20: 510 mg via INTRAVENOUS
  Filled 2011-10-20: qty 17

## 2011-10-20 MED ORDER — CLONIDINE HCL 0.1 MG PO TABS
0.1000 mg | ORAL_TABLET | Freq: Once | ORAL | Status: AC
  Administered 2011-10-20: 0.1 mg via ORAL

## 2011-10-27 ENCOUNTER — Emergency Department (HOSPITAL_COMMUNITY)
Admission: EM | Admit: 2011-10-27 | Discharge: 2011-10-27 | Disposition: A | Discharge status: Home / Self Care | Payer: Medicaid Other | Attending: Emergency Medicine | Admitting: Emergency Medicine

## 2011-10-27 ENCOUNTER — Encounter (HOSPITAL_COMMUNITY): Admission: RE | Admit: 2011-10-27 | Payer: PRIVATE HEALTH INSURANCE | Source: Ambulatory Visit

## 2011-10-27 ENCOUNTER — Encounter (HOSPITAL_COMMUNITY): Payer: Self-pay

## 2011-10-27 DIAGNOSIS — R109 Unspecified abdominal pain: Secondary | ICD-10-CM | POA: Insufficient documentation

## 2011-10-27 DIAGNOSIS — R112 Nausea with vomiting, unspecified: Secondary | ICD-10-CM | POA: Insufficient documentation

## 2011-10-27 DIAGNOSIS — Z794 Long term (current) use of insulin: Secondary | ICD-10-CM | POA: Insufficient documentation

## 2011-10-27 DIAGNOSIS — N189 Chronic kidney disease, unspecified: Secondary | ICD-10-CM | POA: Diagnosis present

## 2011-10-27 DIAGNOSIS — I10 Essential (primary) hypertension: Secondary | ICD-10-CM | POA: Diagnosis present

## 2011-10-27 DIAGNOSIS — E109 Type 1 diabetes mellitus without complications: Secondary | ICD-10-CM | POA: Insufficient documentation

## 2011-10-27 HISTORY — DX: Essential (primary) hypertension: I10

## 2011-10-27 HISTORY — DX: Disorder of kidney and ureter, unspecified: N28.9

## 2011-10-27 LAB — DIFFERENTIAL
Basophils Absolute: 0 10*3/uL (ref 0.0–0.1)
Basophils Relative: 0 % (ref 0–1)
Lymphocytes Relative: 12 % (ref 12–46)
Monocytes Absolute: 0.5 10*3/uL (ref 0.1–1.0)
Neutro Abs: 7.1 10*3/uL (ref 1.7–7.7)
Neutrophils Relative %: 81 % — ABNORMAL HIGH (ref 43–77)

## 2011-10-27 LAB — COMPREHENSIVE METABOLIC PANEL
Alkaline Phosphatase: 68 U/L (ref 39–117)
BUN: 39 mg/dL — ABNORMAL HIGH (ref 6–23)
Chloride: 109 mEq/L (ref 96–112)
Creatinine, Ser: 5.35 mg/dL — ABNORMAL HIGH (ref 0.50–1.10)
GFR calc Af Amer: 11 mL/min — ABNORMAL LOW (ref >90)
Glucose, Bld: 132 mg/dL — ABNORMAL HIGH (ref 70–99)
Potassium: 5.1 mEq/L (ref 3.5–5.1)
Total Bilirubin: 0.1 mg/dL — ABNORMAL LOW (ref 0.3–1.2)

## 2011-10-27 LAB — CBC
MCHC: 34.9 g/dL (ref 30.0–36.0)
Platelets: 99 10*3/uL — ABNORMAL LOW (ref 150–400)
RDW: 14.8 % (ref 11.5–15.5)
WBC: 8.8 10*3/uL (ref 4.0–10.5)

## 2011-10-27 LAB — GLUCOSE, CAPILLARY: Glucose-Capillary: 143 mg/dL — ABNORMAL HIGH (ref 70–99)

## 2011-10-27 LAB — LIPASE, BLOOD: Lipase: 20 U/L (ref 11–59)

## 2011-10-27 MED ORDER — SODIUM CHLORIDE 0.9 % IV BOLUS (SEPSIS)
1000.0000 mL | Freq: Once | INTRAVENOUS | Status: AC
  Administered 2011-10-27: 1000 mL via INTRAVENOUS

## 2011-10-27 MED ORDER — ONDANSETRON 8 MG PO TBDP: 8.0000 mg | ORAL_TABLET | Freq: Three times a day (TID) | ORAL | Status: AC | PRN

## 2011-10-27 MED ORDER — ONDANSETRON HCL 4 MG/2ML IJ SOLN
4.0000 mg | Freq: Once | INTRAMUSCULAR | Status: AC
  Administered 2011-10-27: 4 mg via INTRAVENOUS
  Filled 2011-10-27: qty 2

## 2011-10-27 MED ORDER — FENTANYL CITRATE 0.05 MG/ML IJ SOLN
100.0000 ug | Freq: Once | INTRAMUSCULAR | Status: AC
  Administered 2011-10-27: 100 ug via INTRAVENOUS
  Filled 2011-10-27: qty 2

## 2011-10-27 MED ORDER — CLONIDINE HCL 0.1 MG PO TABS
0.2000 mg | ORAL_TABLET | Freq: Once | ORAL | Status: AC
  Administered 2011-10-27: 0.2 mg via ORAL
  Filled 2011-10-27: qty 2

## 2011-10-27 MED ORDER — CLONIDINE HCL 0.1 MG PO TABS
0.1000 mg | ORAL_TABLET | Freq: Once | ORAL | Status: AC
  Administered 2011-10-27: 0.1 mg via ORAL
  Filled 2011-10-27: qty 1

## 2011-10-27 NOTE — ED Provider Notes (Signed)
History     CSN: [id number]  Arrival date & time 10/27/11  [id number]   First MD Initiated Contact with Patient 10/27/11 0815      Chief Complaint  Patient presents with  . Abdominal Pain  . Emesis     HPI Per EMS- Patient reported that she began having sharp epigastric pain and vomiting blood. EMS stated that they did not see any blood, but patient reported that she did.  Past Medical History  Diagnosis Date  . DIABETES MELLITUS, TYPE I, UNCONTROLLED 11/05/2007  . MEDIAL EPICONDYLITIS, LEFT 12/17/2007  . Vision loss   . Renal disorder   . Diabetes mellitus   . Hypertension     Past Surgical History  Procedure Date  . Refractive surgery     Family History  Problem Relation Age of Onset  . Hypertension Father     History  Substance Use Topics  . Smoking status: Former Research scientist (life sciences)  . Smokeless tobacco: Never Used  . Alcohol Use: No    OB History    Grav Para Term Preterm Abortions TAB SAB Ect Mult Living                  Review of Systems  All other systems reviewed and are negative.    Allergies  Review of patient's allergies indicates no known allergies.  Home Medications   Current Outpatient Rx  Name Route Sig Dispense Refill  . DILTIAZEM HCL ER COATED BEADS 240 MG PO CP24 Oral Take 240 mg by mouth daily.    . FUROSEMIDE 40 MG PO TABS Oral Take 40 mg by mouth daily.    . INSULIN LISPRO PROT & LISPRO (75-25) 100 UNIT/ML [place] SUSP Subcutaneous Inject 8-16 Units into the skin 2 (two) times daily with a meal. 8 units with breakfast, and 16 units with evening meal.    . LEVOTHYROXINE SODIUM 50 MCG PO TABS Oral Take 50 mcg by mouth daily.    . INSULIN PEN NEEDLE 31G X 8 MM MISC Does not apply 1 each by Does not apply route 2 (two) times daily. 60 each 11  . ONDANSETRON 8 MG PO TBDP Oral Take 1 tablet (8 mg total) by mouth every 8 (eight) hours as needed for nausea. 20 tablet 0    BP 152/91  Pulse 79  Temp(Src) 98.2 F (36.8 C) (Oral)  Resp 20  Ht 5\' 4"  (1.626 m)   Wt 130 lb (58.968 kg)  BMI 22.31 kg/m2  SpO2 97%  LMP 10/02/2011  Physical Exam  Nursing note and vitals reviewed. Constitutional: She is oriented to person, place, and time. She appears well-developed and well-nourished. No distress.  HENT:  Head: Normocephalic and atraumatic.  Eyes: Pupils are equal, round, and reactive to light.  Neck: Normal range of motion.  Cardiovascular: Normal rate and intact distal pulses.   Pulmonary/Chest: No respiratory distress.  Abdominal: Normal appearance. She exhibits no distension and no mass. Bowel sounds are decreased. There is no hepatosplenomegaly. There is tenderness. There is no rigidity, no guarding, no CVA tenderness, no tenderness at McBurney's point and negative Murphy's sign. No hernia.    Musculoskeletal: Normal range of motion.  Neurological: She is alert and oriented to person, place, and time. No cranial nerve deficit.  Skin: Skin is warm and dry. No rash noted.  Psychiatric: She has a normal mood and affect. Her behavior is normal.    ED Course  Procedures (including critical care time) Scheduled Meds:    . cloNIDine  0.1 mg Oral Once  . cloNIDine  0.1 mg Oral Once  . cloNIDine  0.2 mg Oral Once  . fentaNYL  100 mcg Intravenous Once  . ondansetron  4 mg Intravenous Once  . sodium chloride  1,000 mL Intravenous Once    Labs Reviewed  COMPREHENSIVE METABOLIC PANEL - Abnormal; Notable for the following:    CO2 17 (*)    Glucose, Bld 132 (*)    BUN 39 (*)    Creatinine, Ser 5.35 (*)    Calcium 8.0 (*)    Total Protein 5.8 (*)    Albumin 2.6 (*)    Total Bilirubin 0.1 (*)    GFR calc non Af Amer 10 (*)    GFR calc Af Amer 11 (*)    All other components within normal limits  CBC - Abnormal; Notable for the following:    RBC 2.81 (*)    Hemoglobin 8.8 (*)    HCT 25.2 (*)    Platelets 99 (*)    All other components within normal limits  DIFFERENTIAL - Abnormal; Notable for the following:    Neutrophils Relative 81  (*)    All other components within normal limits  GLUCOSE, CAPILLARY - Abnormal; Notable for the following:    Glucose-Capillary 143 (*)    All other components within normal limits  LIPASE, BLOOD   No results found.   1. Nausea and vomiting       MDM          Dot Lanes, MD 10/27/11 1325

## 2011-10-27 NOTE — Discharge Instructions (Signed)
Clear Liquid Diet The clear liquid dietconsists of foods that are liquid or will become liquid at room temperature.You should be able to see through the liquid and beverages. Examples of foods allowed on a clear liquid diet include fruit juice, broth or bouillon, gelatin, or frozen ice pops. The purpose of this diet is to provide necessary fluid, electrolytes such a sodium and potassium, and energy to keep the body functioning during times when you are not able to consume a regular diet.A clear liquid diet should not be continued for long periods of time as it is not nutritionally adequate.  REASONS FOR USING A CLEAR LIQUID DIET  In sudden onset (acute) conditions for a patient before or after surgery.   As the first step in oral feeding.   For fluid and electrolyte replacement in diarrheal diseases.   As a diet before certain medical tests are performed.  ADEQUACY The clear liquid diet is adequate only in ascorbic acid, according to the Recommended Dietary Allowances of the [employer]. CHOOSING FOODS Breads and Starches  Allowed:  None are allowed.   Avoid: All are avoided.  Vegetables  Allowed:  Strained tomato or vegetable juice.   Avoid: Any others.  Fruit  Allowed:  Strained fruit juices and fruit drinks. Include 1 serving of citrus or vitamin C-enriched fruit juice daily.   Avoid: Any others.  Meat and Meat Substitutes  Allowed:  None are allowed.   Avoid: All are avoided.  Milk  Allowed:  None are allowed.   Avoid: All are avoided.  Soups and Combination Foods  Allowed:  Clear bouillon, broth, or strained broth-based soups.   Avoid: Any others.  Desserts and Sweets  Allowed:  Sugar, honey. High protein gelatin. Flavored gelatin, ices, or frozen ice pops that do not contain milk.   Avoid: Any others.  Fats and Oils  Allowed:  None are allowed.   Avoid: All are avoided.  Beverages  Allowed:  Carbonated beverages, cereal beverages, coffee  (regular or decaffeinated), or tea.   Avoid: Any others.  Condiments  Allowed:  Iodized salt.   Avoid: Any others, including pepper.  Supplements  Allowed:  Liquid nutrition beverages.   Avoid: Any others that contain lactose or fiber.  SAMPLE MEAL PLAN Breakfast  4 oz strained orange juice.    to 1 cup gelatin (plain or fortified).   1 cup beverage (coffee or tea).   Sugar, if desired.  Midmorning Snack   cup gelatin (plain or fortified).  Lunch  1 cup broth or consomm.   4 oz strained grapefruit juice.    cup gelatin (plain or fortified).   1 cup beverage (coffee or tea).   Sugar, if desired.  Midafternoon Snack   cup fruit ice.    cup strained fruit juice.  Dinner  1 cup broth or consomm.    cup cranberry juice.    cup flavored gelatin (plain or fortified).   1 cup beverage (coffee or tea).   Sugar, if desired.  Evening Snack  4 oz strained apple juice (vitamin C-fortified).    cup flavored gelatin (plain or fortified).  Document Released: 08/21/2005 Document Revised: 05/03/2011 Document Reviewed: 11/18/2010 [facility] Patient Information 2012 [place].

## 2011-10-27 NOTE — ED Notes (Signed)
Per EMS- Patient reported that she began having sharp epigastric pain and vomiting blood. EMS stated that they did not see any blood, but patient reported that she did.

## 2011-10-27 NOTE — ED Notes (Addendum)
Pt's O2 sats dropped to 88% on room air, pt placed on 3L O2 via [place], sats improved to 94%.

## 2011-10-27 NOTE — ED Notes (Signed)
[id number] Expected date:10/27/11<BR> Expected time: 7:21 AM<BR> Means of arrival:Ambulance<BR> Comments:<BR> N/v-hypertensive

## 2011-10-30 ENCOUNTER — Emergency Department (HOSPITAL_COMMUNITY)
Admission: EM | Admit: 2011-10-30 | Discharge: 2011-10-30 | Disposition: A | Discharge status: Home Health | Payer: Medicaid Other | Attending: Emergency Medicine | Admitting: Emergency Medicine

## 2011-10-30 ENCOUNTER — Emergency Department (HOSPITAL_COMMUNITY): Payer: Medicaid Other

## 2011-10-30 ENCOUNTER — Encounter (HOSPITAL_COMMUNITY)
Admission: RE | Admit: 2011-10-30 | Discharge: 2011-10-30 | Disposition: A | Discharge status: Home / Self Care | Payer: PRIVATE HEALTH INSURANCE | Source: Ambulatory Visit | Attending: Nephrology | Admitting: Nephrology

## 2011-10-30 ENCOUNTER — Other Ambulatory Visit: Payer: Self-pay

## 2011-10-30 ENCOUNTER — Encounter (HOSPITAL_COMMUNITY): Payer: Self-pay | Admitting: *Deleted

## 2011-10-30 DIAGNOSIS — I10 Essential (primary) hypertension: Secondary | ICD-10-CM

## 2011-10-30 DIAGNOSIS — N189 Chronic kidney disease, unspecified: Secondary | ICD-10-CM | POA: Insufficient documentation

## 2011-10-30 DIAGNOSIS — R609 Edema, unspecified: Secondary | ICD-10-CM | POA: Insufficient documentation

## 2011-10-30 DIAGNOSIS — I129 Hypertensive chronic kidney disease with stage 1 through stage 4 chronic kidney disease, or unspecified chronic kidney disease: Secondary | ICD-10-CM | POA: Insufficient documentation

## 2011-10-30 DIAGNOSIS — E109 Type 1 diabetes mellitus without complications: Secondary | ICD-10-CM | POA: Insufficient documentation

## 2011-10-30 DIAGNOSIS — Z794 Long term (current) use of insulin: Secondary | ICD-10-CM | POA: Insufficient documentation

## 2011-10-30 DIAGNOSIS — N19 Unspecified kidney failure: Secondary | ICD-10-CM

## 2011-10-30 DIAGNOSIS — R600 Localized edema: Secondary | ICD-10-CM

## 2011-10-30 DIAGNOSIS — Z79899 Other long term (current) drug therapy: Secondary | ICD-10-CM | POA: Insufficient documentation

## 2011-10-30 DIAGNOSIS — M7989 Other specified soft tissue disorders: Secondary | ICD-10-CM | POA: Insufficient documentation

## 2011-10-30 LAB — COMPREHENSIVE METABOLIC PANEL
ALT: 11 U/L (ref 0–35)
Albumin: 2.8 g/dL — ABNORMAL LOW (ref 3.5–5.2)
Alkaline Phosphatase: 76 U/L (ref 39–117)
Chloride: 107 mEq/L (ref 96–112)
GFR calc Af Amer: 11 mL/min — ABNORMAL LOW (ref >90)
Glucose, Bld: 228 mg/dL — ABNORMAL HIGH (ref 70–99)
Potassium: 5.2 mEq/L — ABNORMAL HIGH (ref 3.5–5.1)
Sodium: 137 mEq/L (ref 135–145)
Total Bilirubin: 0.2 mg/dL — ABNORMAL LOW (ref 0.3–1.2)
Total Protein: 6.3 g/dL (ref 6.0–8.3)

## 2011-10-30 LAB — POCT I-STAT, CHEM 8
Glucose, Bld: 230 mg/dL — ABNORMAL HIGH (ref 70–99)
HCT: 27 % — ABNORMAL LOW (ref 36.0–46.0)
Hemoglobin: 9.2 g/dL — ABNORMAL LOW (ref 12.0–15.0)
Potassium: 5.4 mEq/L — ABNORMAL HIGH (ref 3.5–5.1)
Sodium: 139 mEq/L (ref 135–145)

## 2011-10-30 LAB — CBC
Hemoglobin: 9.7 g/dL — ABNORMAL LOW (ref 12.0–15.0)
MCHC: 34.4 g/dL (ref 30.0–36.0)
WBC: 6.8 10*3/uL (ref 4.0–10.5)

## 2011-10-30 IMAGING — CR DG CHEST 2V
2 series · 2 of 2 positions shown · non-contrast
Comparison: Chest x-ray [DATE].

CLINICAL DATA: Cough and shortness of breath.

CHEST - 2 VIEW

[w chest pa]
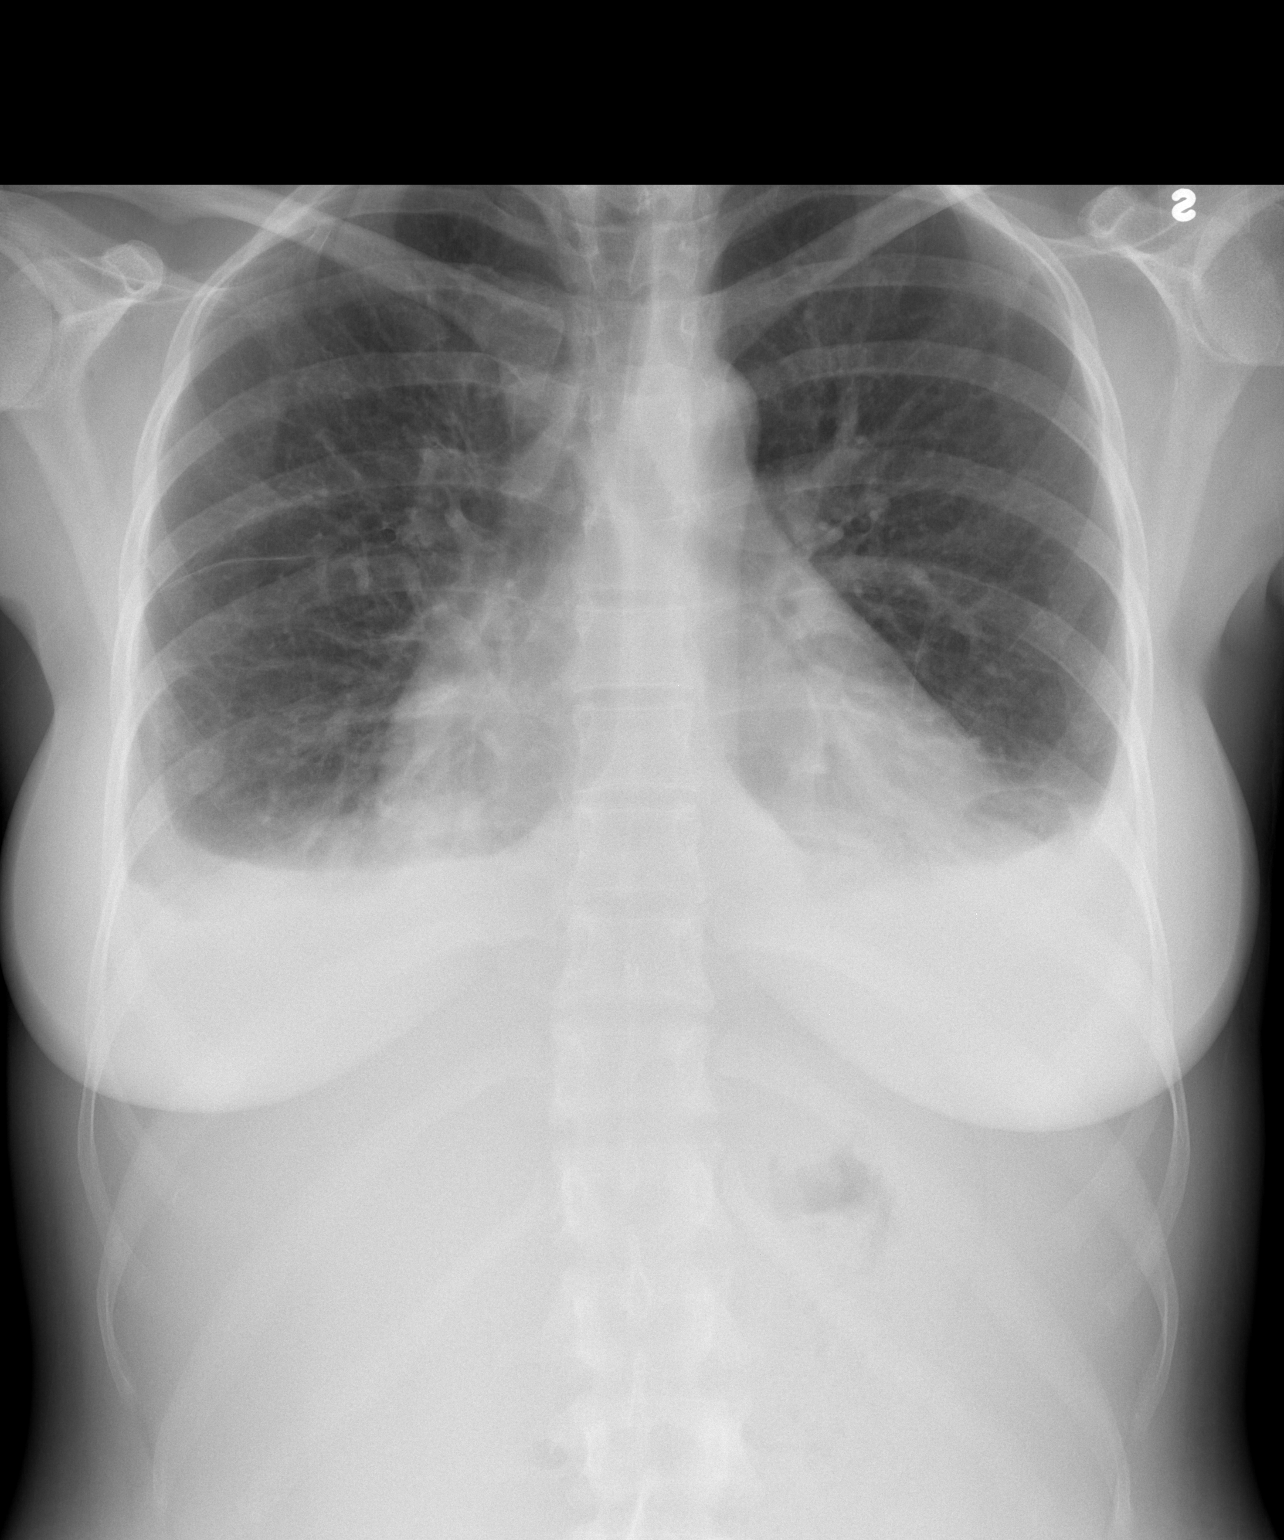

[w chest lat]
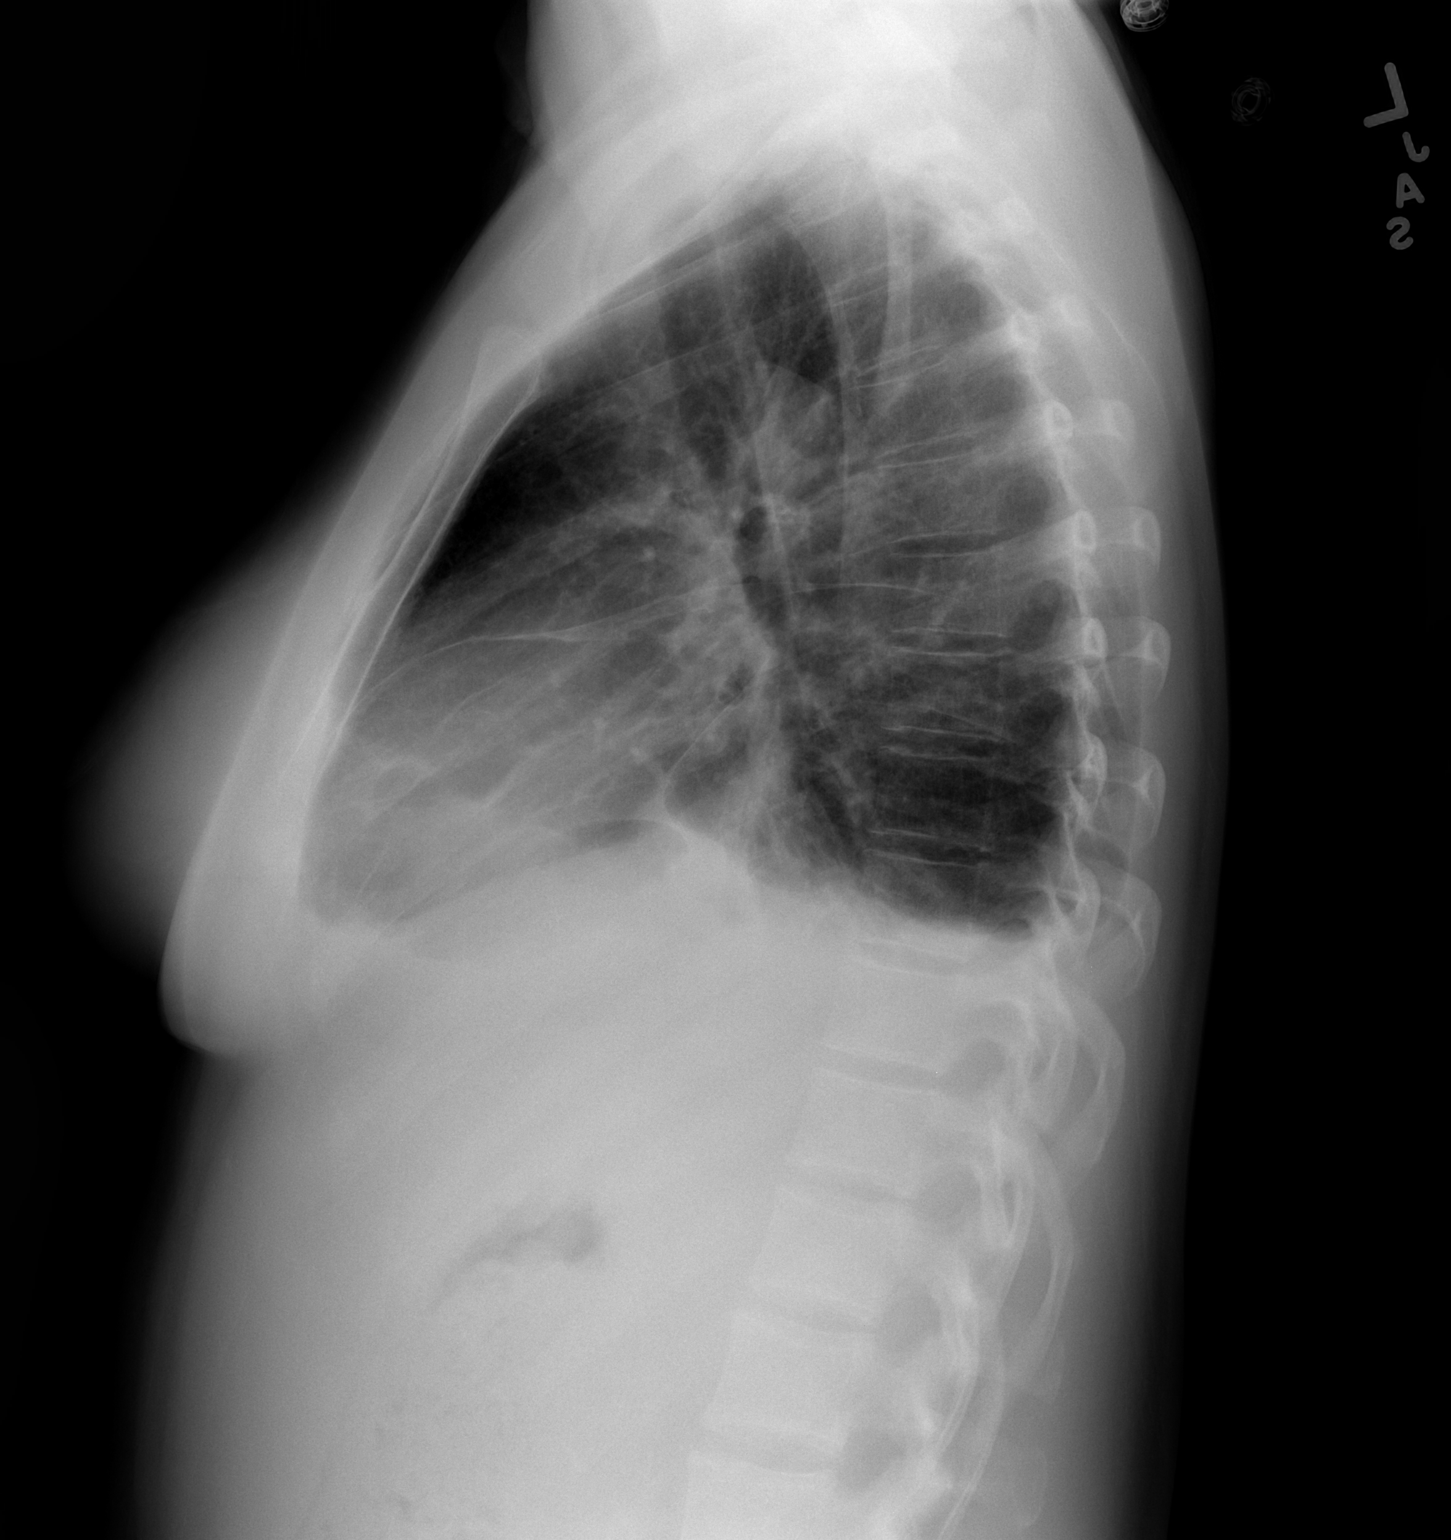

[2 of 2 positions shown; findings below may reference images not displayed]

FINDINGS: The heart is mildly enlarged.  The mediastinal and hilar
contours are within normal limits.  There are bilateral pleural
effusions and overlying atelectasis.  No edema or pneumothorax.
IMPRESSION: Bilateral pleural effusions with overlying atelectasis.

## 2011-10-30 MED ORDER — ONDANSETRON HCL 4 MG/2ML IJ SOLN: 4.0000 mg | Freq: Once | INTRAMUSCULAR | Status: DC

## 2011-10-30 MED ORDER — CLONIDINE HCL 0.1 MG PO TABS
0.1000 mg | ORAL_TABLET | Freq: Once | ORAL | Status: AC | PRN
  Administered 2011-10-30: 0.1 mg via ORAL

## 2011-10-30 MED ORDER — ONDANSETRON 4 MG PO TBDP
8.0000 mg | ORAL_TABLET | Freq: Once | ORAL | Status: AC
  Administered 2011-10-30: 8 mg via ORAL
  Filled 2011-10-30: qty 2

## 2011-10-30 MED ORDER — FUROSEMIDE 40 MG PO TABS: 80.0000 mg | ORAL_TABLET | Freq: Every day | ORAL | Status: DC

## 2011-10-30 MED ORDER — CLONIDINE HCL 0.1 MG PO TABS
ORAL_TABLET | ORAL | Status: AC
  Administered 2011-10-30: 0.1 mg via ORAL
  Filled 2011-10-30: qty 1

## 2011-10-30 NOTE — ED Notes (Signed)
To ed for eval of HTN and body swelling. Pt is under the care of nephrology for 'kidney problems'. C/o SOB. Appears in nad.

## 2011-10-30 NOTE — Discharge Instructions (Signed)
Edema Edema is an abnormal build-up of fluids in tissues. Because this is partly dependent on gravity (water flows to the lowest place), it is more common in the leg sand thighs (lower extremities). It is also common in the looser tissues, like around the eyes. Painless swelling of the feet and ankles is common and increases as a person ages. It may affect both legs and may include the calves or even thighs. When squeezed, the fluid may move out of the affected area and may leave a dent for a few moments. CAUSES   Prolonged standing or sitting in one place for extended periods of time. Movement helps pump tissue fluid into the veins, and absence of movement prevents this, resulting in edema.   Varicose veins. The valves in the veins do not work as well as they should. This causes fluid to leak into the tissues.   Fluid and salt overload.   Injury, burn, or surgery to the leg, ankle, or foot, may damage veins and allow fluid to leak out.   Sunburn damages vessels. Leaky vessels allow fluid to go out into the sunburned tissues.   Allergies (from insect bites or stings, medications or chemicals) cause swelling by allowing vessels to become leaky.   Protein in the blood helps keep fluid in your vessels. Low protein, as in malnutrition, allows fluid to leak out.   Hormonal changes, including pregnancy and menstruation, cause fluid retention. This fluid may leak out of vessels and cause edema.   Medications that cause fluid retention. Examples are sex hormones, blood pressure medications, steroid treatment, or anti-depressants.   Some illnesses cause edema, especially heart failure, kidney disease, or liver disease.   Surgery that cuts veins or lymph nodes, such as surgery done for the heart or for breast cancer, may result in edema.  DIAGNOSIS  Your caregiver is usually easily able to determine what is causing your swelling (edema) by simply asking what is wrong (getting a history) and examining  you (doing a physical). Sometimes x-rays, EKG (electrocardiogram or heart tracing), and blood work may be done to evaluate for underlying medical illness. TREATMENT  General treatment includes:  Leg elevation (or elevation of the affected body part).   Restriction of fluid intake.   Prevention of fluid overload.   Compression of the affected body part. Compression with elastic bandages or support stockings squeezes the tissues, preventing fluid from entering and forcing it back into the blood vessels.   Diuretics (also called water pills or fluid pills) pull fluid out of your body in the form of increased urination. These are effective in reducing the swelling, but can have side effects and must be used only under your caregiver's supervision. Diuretics are appropriate only for some types of edema.  The specific treatment can be directed at any underlying causes discovered. Heart, liver, or kidney disease should be treated appropriately. HOME CARE INSTRUCTIONS   Elevate the legs (or affected body part) above the level of the heart, while lying down.   Avoid sitting or standing still for prolonged periods of time.   Avoid putting anything directly under the knees when lying down, and do not wear constricting clothing or garters on the upper legs.   Exercising the legs causes the fluid to work back into the veins and lymphatic channels. This may help the swelling go down.   The pressure applied by elastic bandages or support stockings can help reduce ankle swelling.   A low-salt diet may help reduce fluid  retention and decrease the ankle swelling.   Take any medications exactly as prescribed.  SEEK MEDICAL CARE IF:  Your edema is not responding to recommended treatments. SEEK IMMEDIATE MEDICAL CARE IF:   You develop shortness of breath or chest pain.   You cannot breathe when you lay down; or if, while lying down, you have to get up and go to the window to get your breath.   You  are having increasing swelling without relief from treatment.   You develop a fever over 102 F (38.9 C).   You develop pain or redness in the areas that are swollen.   Tell your caregiver right away if you have gained 3 lb/1.4 kg in 1 day or 5 lb/2.3 kg in a week.  MAKE SURE YOU:   Understand these instructions.   Will watch your condition.   Will get help right away if you are not doing well or get worse.  Document Released: 08/21/2005 Document Revised: 05/03/2011 Document Reviewed: 04/08/2008 [facility] Patient Information 2012 [place].Hypertension Hypertension is another name for high blood pressure. High blood pressure may mean that your heart needs to work harder to pump blood. Blood pressure consists of two numbers, which includes a higher number over a lower number (example: 110/72). HOME CARE   Make lifestyle changes as told by your doctor. This may include weight loss and exercise.   Take your blood pressure medicine every day.   Limit how much salt you use.   Stop smoking if you smoke.   Do not use drugs.   Talk to your doctor if you are using decongestants or birth control pills. These medicines might make blood pressure higher.   Females should not drink more than 1 alcoholic drink per day. Males should not drink more than 2 alcoholic drinks per day.   See your doctor as told.  GET HELP RIGHT AWAY IF:   You have a blood pressure reading with a top number of 180 or higher.   You get a very bad headache.   You get blurred or changing vision.   You feel confused.   You feel weak, numb, or faint.   You get chest or belly (abdominal) pain.   You throw up (vomit).   You cannot breathe very well.  MAKE SURE YOU:   Understand these instructions.   Will watch your condition.   Will get help right away if you are not doing well or get worse.  Document Released: 02/07/2008 Document Revised: 05/03/2011 Document Reviewed: 02/07/2008 [facility] Patient  Information 2012 [place].

## 2011-10-30 NOTE — ED Notes (Signed)
PA is aware of patient's BP.

## 2011-10-30 NOTE — Progress Notes (Signed)
BP was 200's over 100's and pt c/o headache and legs were swollen and pt stated they hurt and felt like they were going to explode.  I gave the pt a clonidine 0.1mg  po per Dr. Serita Grit orders and waited 8minutes however the pt's BP remained 200's over 100's.  I called Alinda Sierras, CMA for Dr. Posey Pronto and she could not come to the phone but stated to send her to the ER and leave her a message about what is going on.  I called back left a message on Nora's voicemail reporting the pt's symptoms and blood pressure readings despite clonidine being given.  Spoke with the pt and told her the office stated to send her to the ER now.  She said she had to go find her boyfriend to let him know what was going on and that she would go to the ER after that.  I went down to the ER to report to the triage nurse on the patient, did not see the patient in the ER yet but explained to the nurse at the waiting area desk what was going on and that the patient stated she just needed to talk to her boyfriend and would go to the ER after that.

## 2011-10-30 NOTE — ED Provider Notes (Signed)
History     CSN: [id number]  Arrival date & time 10/30/11  1526   First MD Initiated Contact with Patient 10/30/11 1929      Chief Complaint  Patient presents with  . Leg Swelling    (Consider location/radiation/quality/duration/timing/severity/associated sxs/prior treatment) HPI  Pt presents to the ED from short stay for hypertension, lower extremity swelling and abdominal distention. The patient has known kidney disease with a GFR of 20. Her PCP is Dr. Tanna Furry. She says that she has diabetes and gets hemoglobin shots from short stay to help with her SOB. She denies symptoms of headache, chest pain, SOB, weakness. Pt is alert and oriented and in NAD.  Past Medical History  Diagnosis Date  . DIABETES MELLITUS, TYPE I, UNCONTROLLED 11/05/2007  . MEDIAL EPICONDYLITIS, LEFT 12/17/2007  . Vision loss   . Renal disorder   . Diabetes mellitus   . Hypertension     Past Surgical History  Procedure Date  . Refractive surgery     Family History  Problem Relation Age of Onset  . Hypertension Father     History  Substance Use Topics  . Smoking status: Former Research scientist (life sciences)  . Smokeless tobacco: Never Used  . Alcohol Use: No    OB History    Grav Para Term Preterm Abortions TAB SAB Ect Mult Living                  Review of Systems  All other systems reviewed and are negative.    Allergies  Review of patient's allergies indicates no known allergies.  Home Medications   Current Outpatient Rx  Name Route Sig Dispense Refill  . DILTIAZEM HCL ER COATED BEADS 240 MG PO CP24 Oral Take 240 mg by mouth daily.    . INSULIN LISPRO PROT & LISPRO (75-25) 100 UNIT/ML [place] SUSP Subcutaneous Inject 8-16 Units into the skin 2 (two) times daily with a meal. 8 units with breakfast, and 16 units with evening meal.    . LEVOTHYROXINE SODIUM 50 MCG PO TABS Oral Take 50 mcg by mouth daily.    Marland Kitchen ONDANSETRON 8 MG PO TBDP Oral Take 1 tablet (8 mg total) by mouth every 8 (eight) hours as needed for  nausea. 20 tablet 0  . FUROSEMIDE 40 MG PO TABS Oral Take 2 tablets (80 mg total) by mouth daily. 30 tablet 0  . INSULIN PEN NEEDLE 31G X 8 MM MISC Does not apply 1 each by Does not apply route 2 (two) times daily. 60 each 11    BP 198/100  Pulse 108  Temp(Src) 98.4 F (36.9 C) (Oral)  Resp 18  SpO2 95%  LMP 10/02/2011  Physical Exam  Constitutional: She appears well-developed and well-nourished.  HENT:  Head: Normocephalic and atraumatic.  Eyes: Conjunctivae are normal. Pupils are equal, round, and reactive to light.  Neck: Trachea normal, normal range of motion and full passive range of motion without pain. Neck supple.  Cardiovascular: Normal rate, regular rhythm and normal pulses.   Pulmonary/Chest: Effort normal and breath sounds normal. Chest wall is not dull to percussion. She exhibits no tenderness, no crepitus, no edema, no deformity and no retraction.  Abdominal: Soft. Normal appearance and bowel sounds are normal. She exhibits distension (mild distention noted). There is no tenderness.  Musculoskeletal: Normal range of motion.       Left knee: She exhibits swelling (edema of bilateral lower extremity).  Neurological: She is alert. She has normal strength.  Skin: Skin is warm,  dry and intact.  Psychiatric: She has a normal mood and affect. Her speech is normal and behavior is normal. Judgment and thought content normal. Cognition and memory are normal.    ED Course  Procedures (including critical care time)  Labs Reviewed  POCT I-STAT, CHEM 8 - Abnormal; Notable for the following:    Potassium 5.4 (*)    Chloride 113 (*)    BUN 47 (*)    Creatinine, Ser 5.30 (*)    Glucose, Bld 230 (*)    Hemoglobin 9.2 (*)    HCT 27.0 (*)    All other components within normal limits  CBC - Abnormal; Notable for the following:    RBC 3.19 (*)    Hemoglobin 9.7 (*)    HCT 28.2 (*)    Platelets 129 (*)    All other components within normal limits  COMPREHENSIVE METABOLIC PANEL  - Abnormal; Notable for the following:    Potassium 5.2 (*)    Glucose, Bld 228 (*)    BUN 47 (*)    Creatinine, Ser 5.38 (*)    Albumin 2.8 (*)    Total Bilirubin 0.2 (*)    GFR calc non Af Amer 10 (*)    GFR calc Af Amer 11 (*)    All other components within normal limits   Dg Chest 2 View  10/30/2011  *RADIOLOGY REPORT*  Clinical Data: Cough and shortness of breath.  CHEST - 2 VIEW  Comparison: Chest x-ray 04/22/2010.  Findings: The heart is mildly enlarged.  The mediastinal and hilar contours are within normal limits.  There are bilateral pleural effusions and overlying atelectasis.  No edema or pneumothorax.  IMPRESSION: Bilateral pleural effusions with overlying atelectasis.  Original Report Authenticated By: P. Kalman Jewels, M.D.     1. Lower extremity edema   2. Hypertension   3. Renal failure       MDM   Date: 10/30/2011  Rate: 100  Rhythm: sinus tachycardia  QRS Axis: normal  Intervals: normal  ST/T Wave abnormalities: normal  Conduction Disutrbances:none  Narrative Interpretation:   Old EKG Reviewed: none available  ,  Patient Complaints #1 lower extremity swelling #2 hypertension  Time of re-evaluation Pt evaluated by Dr. Christy Gentles and myself. Pt is to be ambulated and [facility] consulted. [name] agrees that patient can be discharged with close follow-up and Lasix bumped up to 80mg  per day.  Plan  Pt is to start taking 80mg  lasix per day and to call Dr. Posey Pronto tomorrow for close follow-up  Referrals Pt has been informed that she needs to call Dr. Posey Pronto tomorrow for close follow-up of her visit at the ER today  Prescriptions #1 take Lasix 80mg  per day   Pt has been given return to ER precautions. Pt is agreeable with plan and will return to the ED as needed for any changing or worsening of symptoms.           Linus Mako, PA 10/30/11 2317  Linus Mako, PA 12/07/11 [phone]

## 2011-10-30 NOTE — ED Notes (Signed)
Patient ambulated well in hallway

## 2011-10-30 NOTE — ED Provider Notes (Signed)
Pt seen with PA Here for LE edema and HTN CXR/labs reviewed She is well appearing, ambulatory D/w dr Lorrene Reid, recommend increase lasix to 80-mg POdaily and f/u with nephro this week  Sharyon Cable, MD 10/30/11 2305

## 2011-10-30 NOTE — ED Notes (Signed)
Patient is AOx4 and comfortable with her discharge instructions. 

## 2011-11-03 ENCOUNTER — Encounter: Payer: Self-pay | Admitting: Vascular Surgery

## 2011-11-03 ENCOUNTER — Encounter (HOSPITAL_COMMUNITY)
Admission: RE | Admit: 2011-11-03 | Discharge: 2011-11-03 | Disposition: A | Discharge status: Home / Self Care | Payer: Medicaid Other | Source: Ambulatory Visit | Attending: Nephrology | Admitting: Nephrology

## 2011-11-03 ENCOUNTER — Ambulatory Visit (INDEPENDENT_AMBULATORY_CARE_PROVIDER_SITE_OTHER): Payer: PRIVATE HEALTH INSURANCE | Admitting: Vascular Surgery

## 2011-11-03 ENCOUNTER — Encounter (INDEPENDENT_AMBULATORY_CARE_PROVIDER_SITE_OTHER): Payer: Medicaid Other | Admitting: Vascular Surgery

## 2011-11-03 VITALS — BP 207/101 | HR 128 | Resp 18 | Ht 62.0 in | Wt 138.0 lb

## 2011-11-03 DIAGNOSIS — N184 Chronic kidney disease, stage 4 (severe): Secondary | ICD-10-CM | POA: Insufficient documentation

## 2011-11-03 DIAGNOSIS — Z01810 Encounter for preprocedural cardiovascular examination: Secondary | ICD-10-CM

## 2011-11-03 DIAGNOSIS — N186 End stage renal disease: Secondary | ICD-10-CM

## 2011-11-03 DIAGNOSIS — N185 Chronic kidney disease, stage 5: Secondary | ICD-10-CM | POA: Insufficient documentation

## 2011-11-03 DIAGNOSIS — D638 Anemia in other chronic diseases classified elsewhere: Secondary | ICD-10-CM | POA: Insufficient documentation

## 2011-11-03 LAB — RENAL FUNCTION PANEL
BUN: 51 mg/dL — ABNORMAL HIGH (ref 6–23)
CO2: 17 mEq/L — ABNORMAL LOW (ref 19–32)
Chloride: 105 mEq/L (ref 96–112)
Creatinine, Ser: 6.04 mg/dL — ABNORMAL HIGH (ref 0.50–1.10)

## 2011-11-03 NOTE — Progress Notes (Signed)
BP elevated x 2. Called [facility], spoke with Alinda Sierras. Orders received. Instructions given to patient. Verbalizes understanding. Reviewed s/s of stroke with patient. Verbalizes understanding.

## 2011-11-03 NOTE — Progress Notes (Signed)
[facility] [place]  Referred by:  Mack Hook, MD [address] [place], [place] [address]  Reason for referral: New access  History of Present Illness  [name] [name] is a 31 y.o. (08/27/1981) female who presents for evaluation for permanent access.  The patient is right hand dominant.  The patient has not had previous access procedures.  Previous central venous cannulation procedures include: none.  The patient has never had a PPM placed. Source of her chronic kidney disease failure is insulin dependent diabetes mellitus.  Past Medical History  Diagnosis Date  . DIABETES MELLITUS, TYPE I, UNCONTROLLED 11/05/2007  . MEDIAL EPICONDYLITIS, LEFT 12/17/2007  . Vision loss   . Renal disorder   . Diabetes mellitus   . Hypertension     Past Surgical History  Procedure Date  . Refractive surgery   . Eye surgery     History   Social History  . Marital Status: Single    Spouse Name: N/A    Number of Children: N/A  . Years of Education: N/A   Occupational History  . Not on file.   Social History Main Topics  . Smoking status: Former Smoker -- 1 years    Types: Cigarettes  . Smokeless tobacco: Never Used  . Alcohol Use: No  . Drug Use: No  . Sexually Active: Yes    Birth Control/ Protection: Pill   Other Topics Concern  . Not on file   Social History Narrative   Worked for [employer]--as did husband. Had to leave [place] for safety reasons. Has been in [place] since 9/09.    Family History  Problem Relation Age of Onset  . Hypertension Father     Current Outpatient Prescriptions on File Prior to Visit  Medication Sig Dispense Refill  . furosemide (LASIX) 40 MG tablet Take 2 tablets (80 mg total) by mouth daily.  30 tablet  0  . insulin lispro protamine-insulin lispro (HUMALOG 75/25) (75-25) 100 UNIT/ML SUSP Inject 8-16 Units into the skin 2 (two) times daily with a meal. 8 units with breakfast, and 16 units with evening meal.        . Insulin Pen Needle 31G X 8 MM MISC 1 each by Does not apply route 2 (two) times daily.  60 each  11  . levothyroxine (SYNTHROID, LEVOTHROID) 50 MCG tablet Take 50 mcg by mouth daily.      Marland Kitchen diltiazem (CARDIZEM CD) 240 MG 24 hr capsule Take 240 mg by mouth daily.      . ondansetron (ZOFRAN ODT) 8 MG disintegrating tablet Take 1 tablet (8 mg total) by mouth every 8 (eight) hours as needed for nausea.  20 tablet  0    No Known Allergies  REVIEW OF SYSTEMS:  (Positives indicated with an "x", otherwise negative)  CARDIOVASCULAR: [ ]  chest pain    [x]  chest pressure    [x]  palpitations    [x]  orthopnea   [x]  dyspnea on exert. [x]  claudication    [ ]  rest pain     [ ]  DVT    [ ]  phlebitis  [x]  leg swelling  PULMONARY:    [x]  cough  [ ]  asthma  [ ]  wheezing  NEUROLOGIC:    [ ]  weakness    [ ]  paresthesias   [ ]  aphasia    [ ]  amaurosis    [x]  dizziness  HEMATOLOGIC:    [ ]  bleeding problems  [ ]  clotting disorders  MUSCULOSKEL: [ ]   joint pain     [ ]  joint swelling  GASTROINTEST:  [ ]   blood in stool   [ ]   hematemesis  GENITOURINARY:   [ ]   dysuria    [ ]   hematuria  PSYCHIATRIC:   [ ]  history of major depression  INTEGUMENTARY: [ ]  rashes    [ ]  ulcers  CONSTITUTIONAL:  [ ]  fever     [ ]  chills  Physical Examination  Filed Vitals:   11/03/11 1458  BP: 207/101  Pulse: 128  Resp: 18  Height: 5\' 2"  (1.575 m)  Weight: 138 lb (62.596 kg)   Body mass index is 25.24 kg/(m^2).  General: A&O x 3, WDWN, thin  Head: [place]/AT  Ear/Nose/Throat: Hearing grossly intact, nares w/o erythema or drainage, oropharynx w/o Erythema/Exudate  Eyes: PERRLA, EOMI  Neck: Supple, no nuchal rigidity, no palpable LAD  Pulmonary: Sym exp, good air movt, CTAB, no rales, rhonchi, & wheezing  Cardiac: RRR, Nl S1, S2, no Murmurs, rubs or gallops  Vascular: Vessel Right Left  Radial Palpable Palpable  Brachial Palpable Palpable  Carotid Palpable, without bruit Palpable, without bruit  Aorta  Non-palpable N/A  Femoral Palpable Palpable  Popliteal Non-palpable Non-palpable  PT Palpable Palpable  DP Palpable Palpable   Gastrointestinal: soft, NTND, -G/R, - HSM, - masses, - CVAT B  Musculoskeletal: M/S 5/5 throughout , Extremities without ischemic changes   Neurologic: CN 2-12 intact , Pain and light touch intact in extremities , Motor exam as listed above  Psychiatric: Judgment intact, Mood & affect appropriate for pt's clinical situation  Dermatologic: See M/S exam for extremity exam, no rashes otherwise noted  Lymph : No Cervical, Axillary, or Inguinal lymphadenopathy   Non-Invasive Vascular Imaging  Vein Mapping  (Date: 11/03/10):   R arm: acceptable vein conduits include none, R upper arm cephalic is marginal but might be worth a look at  L arm: acceptable vein conduits include none  Outside Studies/Documentation 5 pages of outside documents were reviewed including: nephrology clinic work-up.  Medical Decision Making  [name] [name] is a 31 y.o. female who presents with chronic kidney disease stage V  Based on vein mapping and examination, this patient's permanent access options include: low possibility of R BC AVF, but most likely a AVG will be needed.  I suspect she does not fully appreciate the magnitude her kidney currently.  Given her young age, consideration for kidney and pancreas transplant might be in order.  By report, her family is willing to consider a living related donation.  I had an extensive discussion with this patient in regards to the nature of access surgery, including risk, benefits, and alternatives.    The patient is aware that the risks of access surgery include but are not limited to: bleeding, infection, steal syndrome, nerve damage, ischemic monomelic neuropathy, failure of access to mature, and possible need for additional access procedures in the future.  She would to like discuss her options with her nephrologist prior to  proceeding.  Adele Barthel, MD [facility] [place] Office: [phone] Pager: [phone]  11/03/2011, 5:09 PM

## 2011-11-03 NOTE — Progress Notes (Unsigned)
Bilateral UE vein mapping performed @ [facility] 11/03/2011

## 2011-11-06 ENCOUNTER — Encounter (HOSPITAL_COMMUNITY)
Admission: RE | Admit: 2011-11-06 | Discharge: 2011-11-06 | Disposition: A | Discharge status: Home / Self Care | Payer: Medicaid Other | Source: Ambulatory Visit | Attending: Nephrology | Admitting: Nephrology

## 2011-11-06 MED ORDER — EPOETIN ALFA 20000 UNIT/ML IJ SOLN
INTRAMUSCULAR | Status: AC
  Administered 2011-11-06: 20000 [IU] via SUBCUTANEOUS
  Filled 2011-11-06: qty 1

## 2011-11-06 MED ORDER — EPOETIN ALFA 10000 UNIT/ML IJ SOLN: 20000.0000 [IU] | INTRAMUSCULAR | Status: DC

## 2011-11-06 MED ORDER — CLONIDINE HCL 0.1 MG PO TABS
ORAL_TABLET | ORAL | Status: AC
  Filled 2011-11-06: qty 2

## 2011-11-06 MED ORDER — CLONIDINE HCL 0.1 MG PO TABS
0.2000 mg | ORAL_TABLET | Freq: Once | ORAL | Status: AC
  Administered 2011-11-06: 0.2 mg via ORAL

## 2011-11-06 NOTE — Progress Notes (Signed)
Spoke with [name] [name] at  [name] re:  BP 204/114; states she will speak with Dr. Posey Pronto.

## 2011-11-06 NOTE — Progress Notes (Signed)
0.2 Clonidine given per oder; Verbal order taken for following: 1. If pt. BP less than 180/100, pt may have Procrit,  2. If BP greater than 180/100, have pt. Increase Hydalazine to 100 mg. BID

## 2011-11-10 ENCOUNTER — Emergency Department (HOSPITAL_COMMUNITY): Payer: Medicaid Other

## 2011-11-10 ENCOUNTER — Encounter (HOSPITAL_COMMUNITY): Payer: Medicaid Other

## 2011-11-10 ENCOUNTER — Encounter (HOSPITAL_COMMUNITY): Payer: Self-pay

## 2011-11-10 ENCOUNTER — Emergency Department (HOSPITAL_COMMUNITY)
Admission: EM | Admit: 2011-11-10 | Discharge: 2011-11-10 | Disposition: A | Discharge status: Home / Self Care | Payer: Medicaid Other | Attending: Emergency Medicine | Admitting: Emergency Medicine

## 2011-11-10 ENCOUNTER — Other Ambulatory Visit (HOSPITAL_COMMUNITY): Payer: Self-pay | Admitting: *Deleted

## 2011-11-10 DIAGNOSIS — Z794 Long term (current) use of insulin: Secondary | ICD-10-CM | POA: Insufficient documentation

## 2011-11-10 DIAGNOSIS — R112 Nausea with vomiting, unspecified: Secondary | ICD-10-CM | POA: Insufficient documentation

## 2011-11-10 DIAGNOSIS — R5381 Other malaise: Secondary | ICD-10-CM | POA: Insufficient documentation

## 2011-11-10 DIAGNOSIS — R0602 Shortness of breath: Secondary | ICD-10-CM | POA: Insufficient documentation

## 2011-11-10 DIAGNOSIS — N289 Disorder of kidney and ureter, unspecified: Secondary | ICD-10-CM | POA: Insufficient documentation

## 2011-11-10 DIAGNOSIS — E119 Type 2 diabetes mellitus without complications: Secondary | ICD-10-CM | POA: Insufficient documentation

## 2011-11-10 DIAGNOSIS — R51 Headache: Secondary | ICD-10-CM | POA: Insufficient documentation

## 2011-11-10 DIAGNOSIS — Z79899 Other long term (current) drug therapy: Secondary | ICD-10-CM | POA: Insufficient documentation

## 2011-11-10 DIAGNOSIS — I10 Essential (primary) hypertension: Secondary | ICD-10-CM | POA: Insufficient documentation

## 2011-11-10 DIAGNOSIS — R109 Unspecified abdominal pain: Secondary | ICD-10-CM | POA: Insufficient documentation

## 2011-11-10 DIAGNOSIS — R10816 Epigastric abdominal tenderness: Secondary | ICD-10-CM | POA: Insufficient documentation

## 2011-11-10 LAB — LIPASE, BLOOD: Lipase: 23 U/L (ref 11–59)

## 2011-11-10 LAB — URINALYSIS, ROUTINE W REFLEX MICROSCOPIC
Bilirubin Urine: NEGATIVE
Nitrite: NEGATIVE
Specific Gravity, Urine: 1.018 (ref 1.005–1.030)
Urobilinogen, UA: 0.2 mg/dL (ref 0.0–1.0)

## 2011-11-10 LAB — BASIC METABOLIC PANEL
GFR calc Af Amer: 8 mL/min — ABNORMAL LOW (ref >90)
GFR calc non Af Amer: 7 mL/min — ABNORMAL LOW (ref >90)
Potassium: 4.6 mEq/L (ref 3.5–5.1)
Sodium: 135 mEq/L (ref 135–145)

## 2011-11-10 LAB — URINE MICROSCOPIC-ADD ON

## 2011-11-10 LAB — DIFFERENTIAL
Basophils Absolute: 0 10*3/uL (ref 0.0–0.1)
Basophils Relative: 0 % (ref 0–1)
Eosinophils Absolute: 0.1 10*3/uL (ref 0.0–0.7)
Neutrophils Relative %: 77 % (ref 43–77)

## 2011-11-10 LAB — CBC
MCH: 29.8 pg (ref 26.0–34.0)
MCHC: 34.7 g/dL (ref 30.0–36.0)
Platelets: 160 10*3/uL (ref 150–400)

## 2011-11-10 IMAGING — CT CT HEAD W/O CM
1 of 2 series · 13 of 30 positions shown, 17 images · non-contrast
Comparison: None

CLINICAL DATA: Left side headache for 6-7 hours, nausea, vomiting,
blurred vision

CT HEAD WITHOUT CONTRAST
TECHNIQUE: Contiguous axial images were obtained from the base of
the skull through the vertex without contrast.

[Series 2: brain · axial · 0.47mm/px · z∈[+125,+249]mm · 13 of 28 slices shown, 17 images]
[im 2/28  brain]
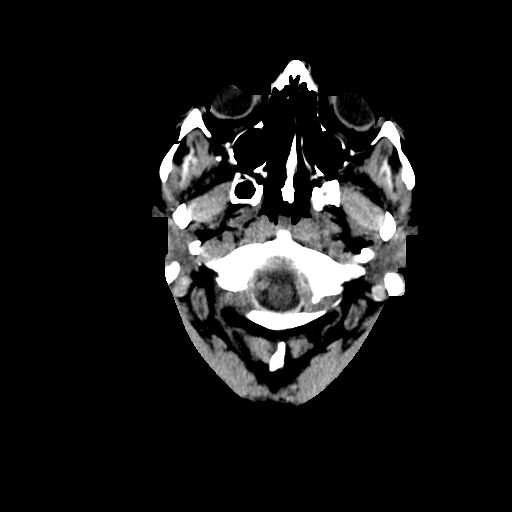
[im 2/28  bone]
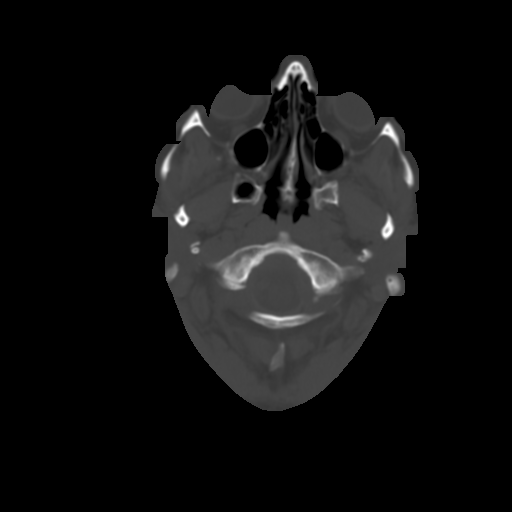
[im 4/28  brain]
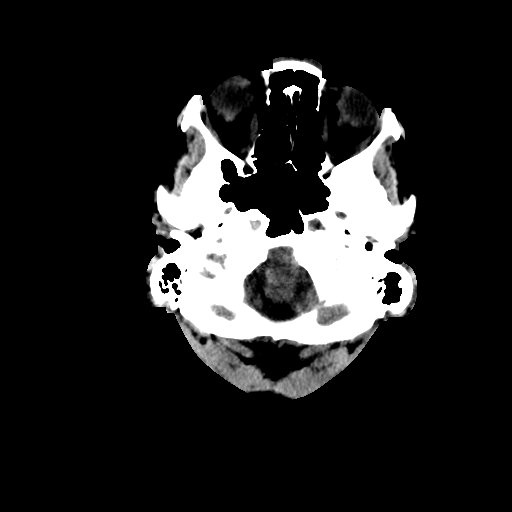
[im 6/28  brain]
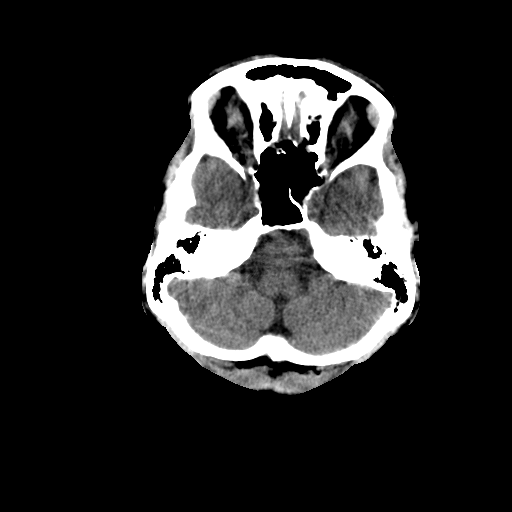
[im 8/28  brain]
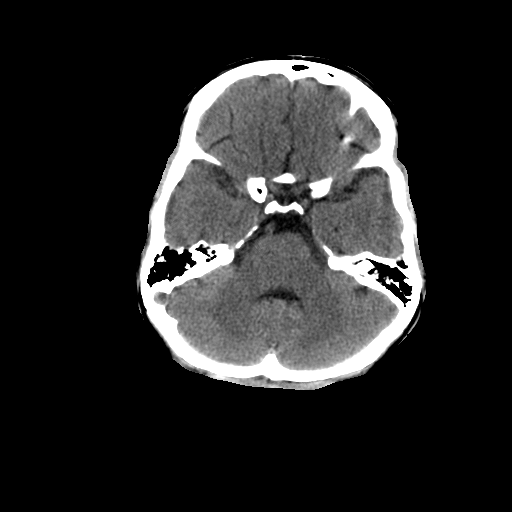
[im 10/28  brain]
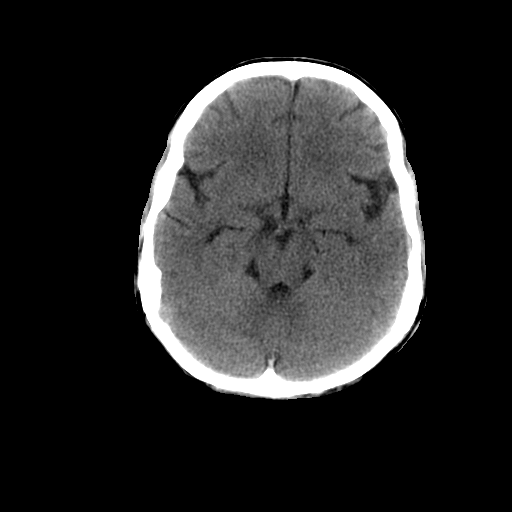
[im 10/28  bone]
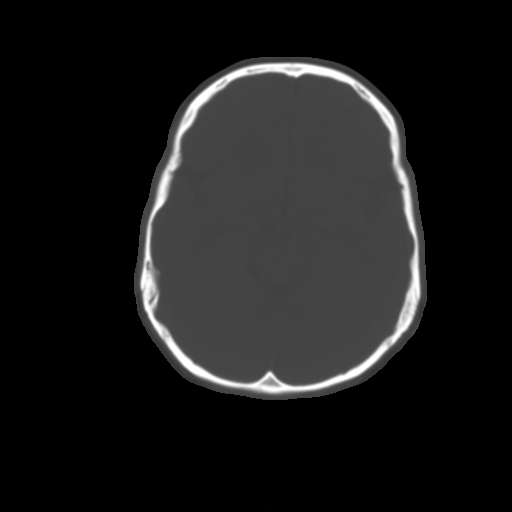
[im 12/28  brain]
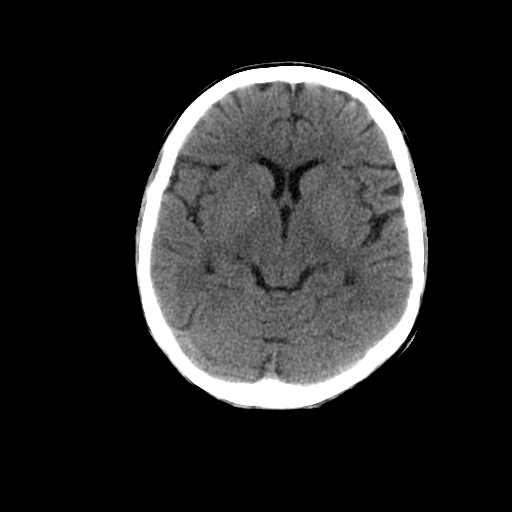
[im 14/28  brain]
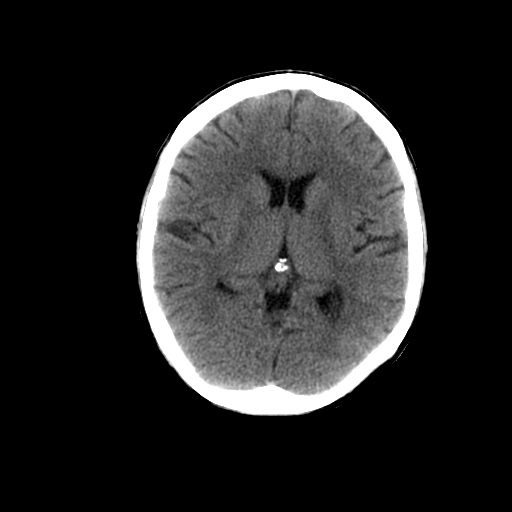
[im 16/28  brain]
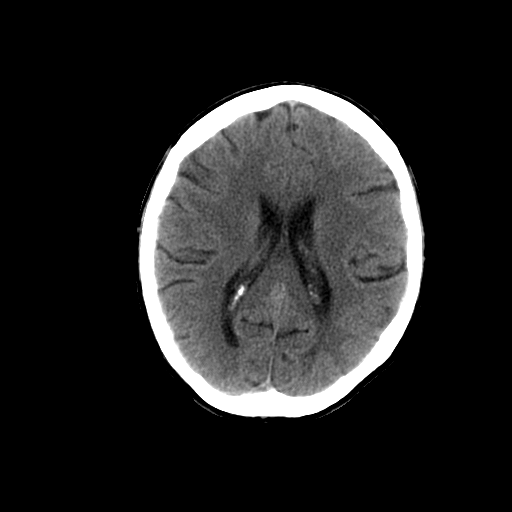
[im 18/28  brain]
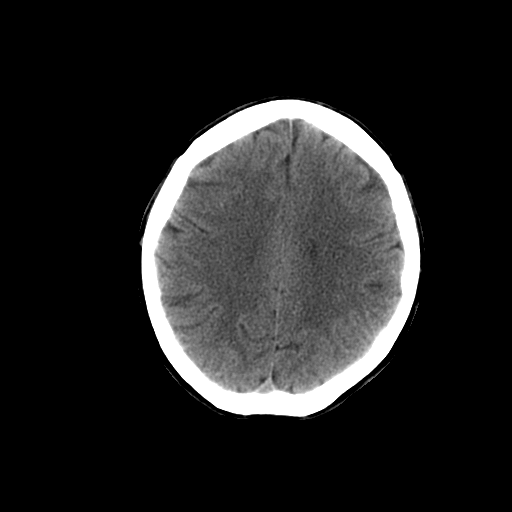
[im 18/28  bone]
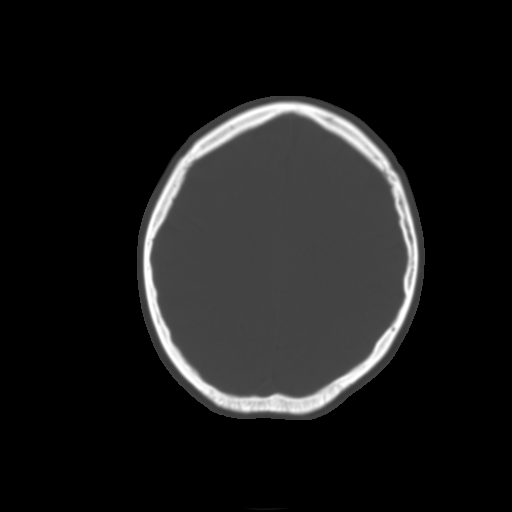
[im 20/28  brain]
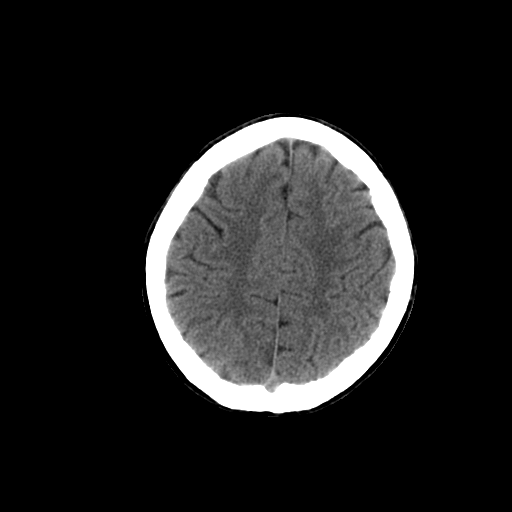
[im 22/28  brain]
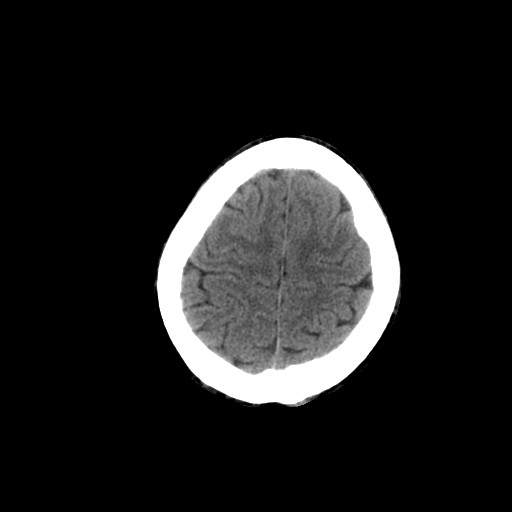
[im 24/28  brain]
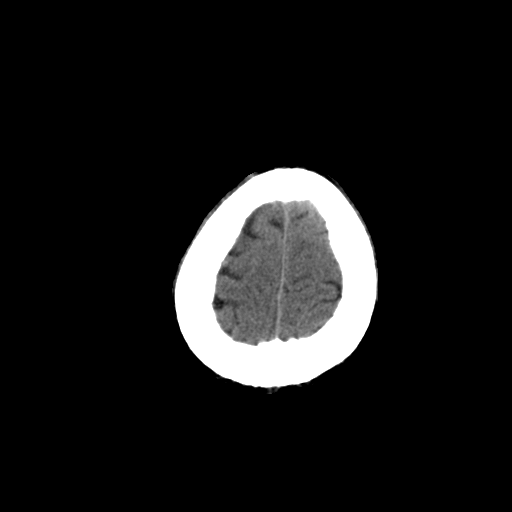
[im 26/28  brain]
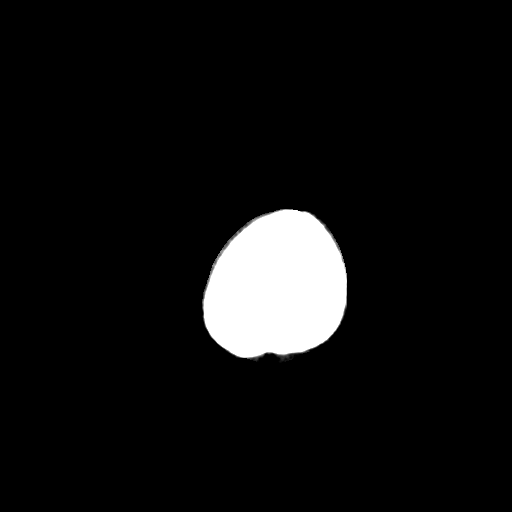
[im 26/28  bone]
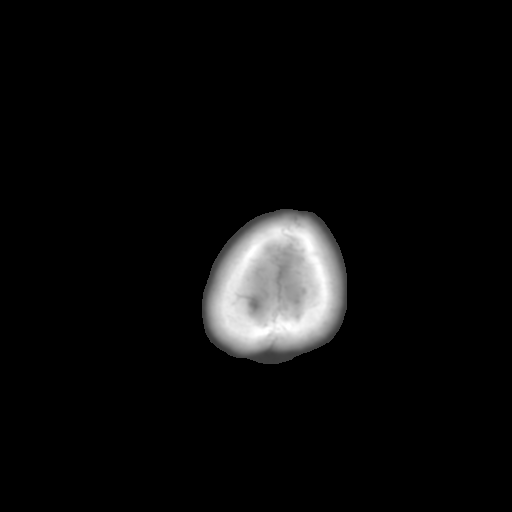

[13 of 30 positions shown; findings below may reference images not displayed]

FINDINGS: Normal ventricular morphology.
No midline shift or mass effect.
Normal appearance of brain parenchyma.
No intracranial hemorrhage, mass lesion, or acute infarction.
Visualized paranasal sinuses and mastoid air cells clear.
Bones unremarkable.
IMPRESSION: No acute intracranial abnormalities.

## 2011-11-10 IMAGING — CR DG CHEST 2V
2 series · 2 of 2 positions shown · non-contrast
Comparison: [DATE]

CLINICAL DATA: Weakness, shortness of breath, headache, nausea,
vomiting, hypertension, diabetes

CHEST - 2 VIEW

[w chest lat]
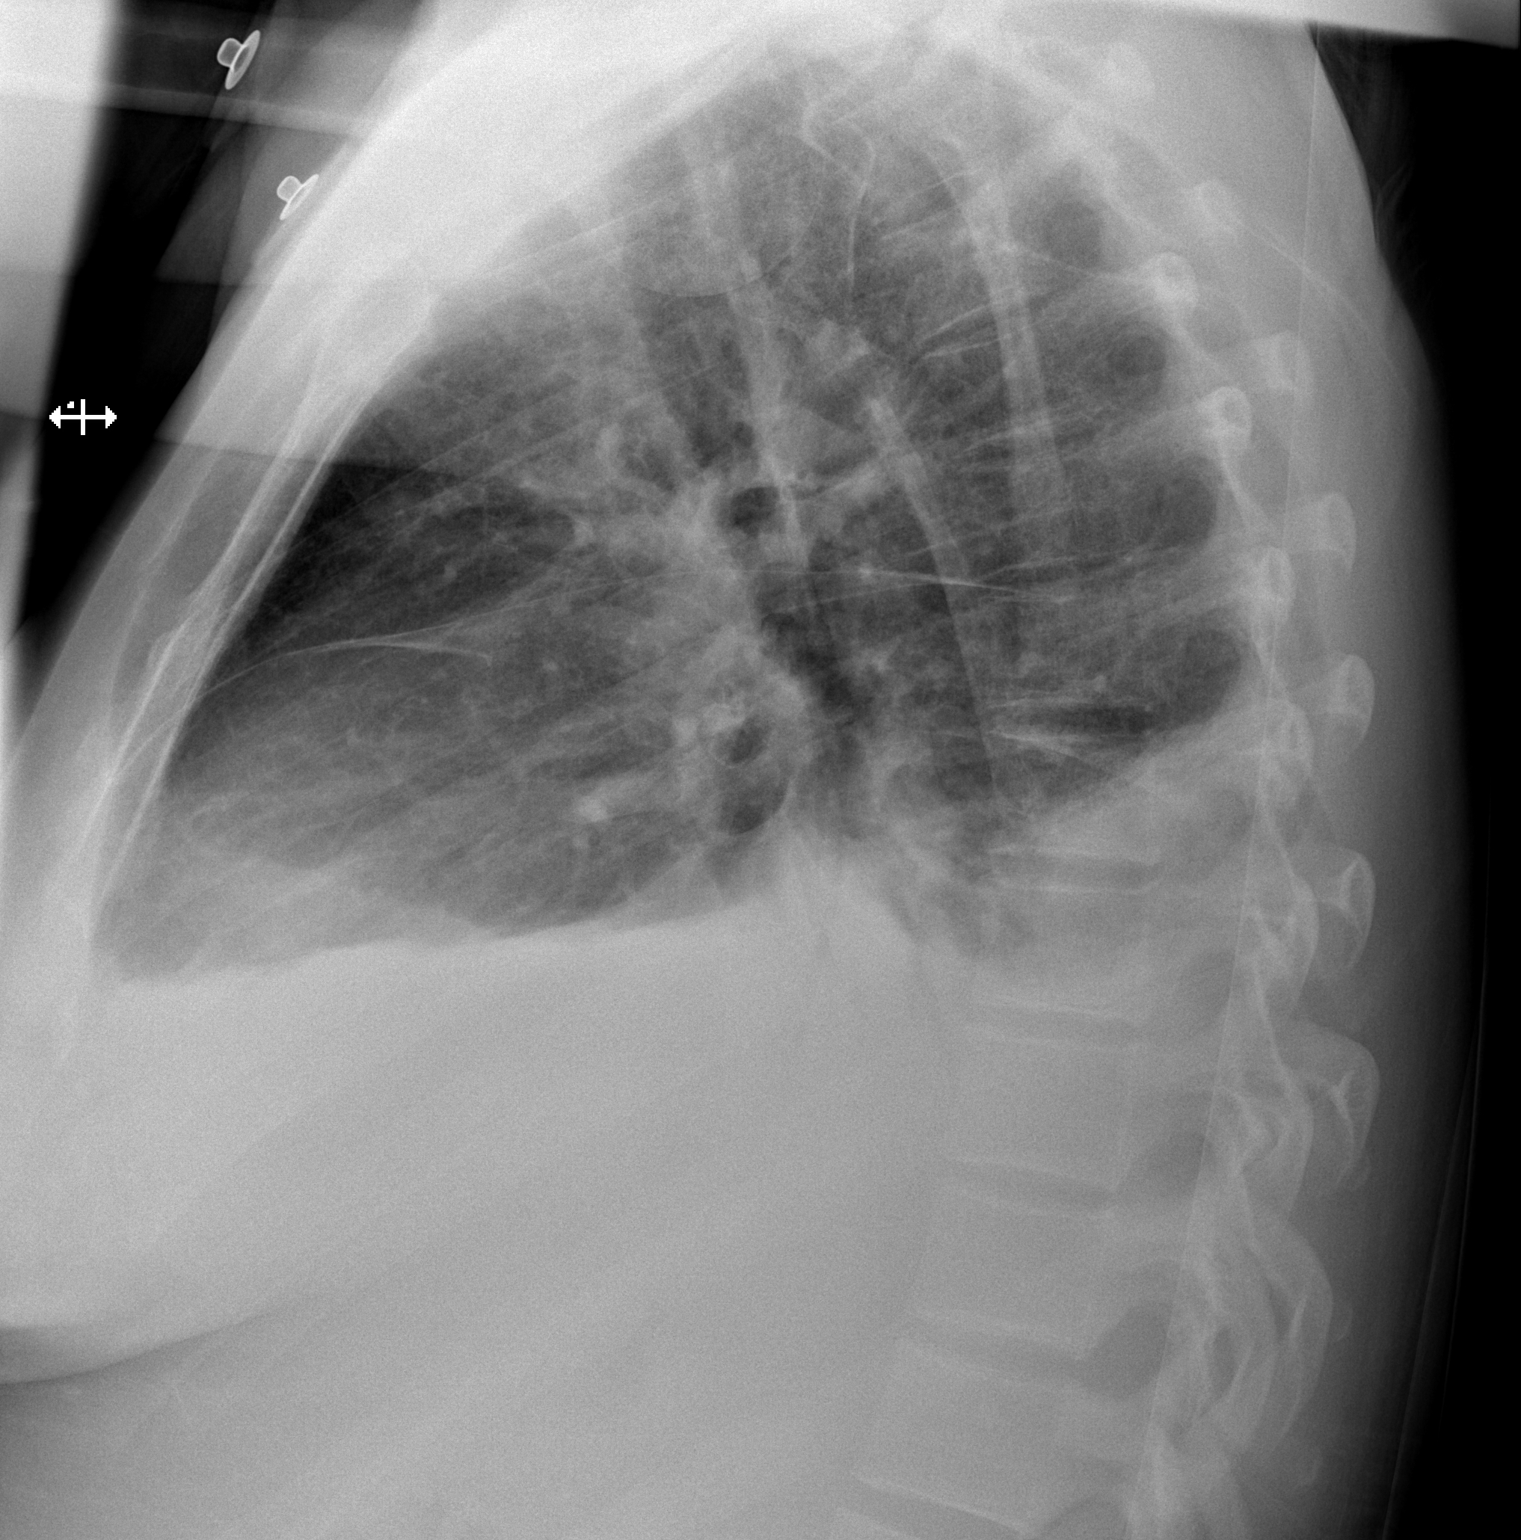

[x chest ap]
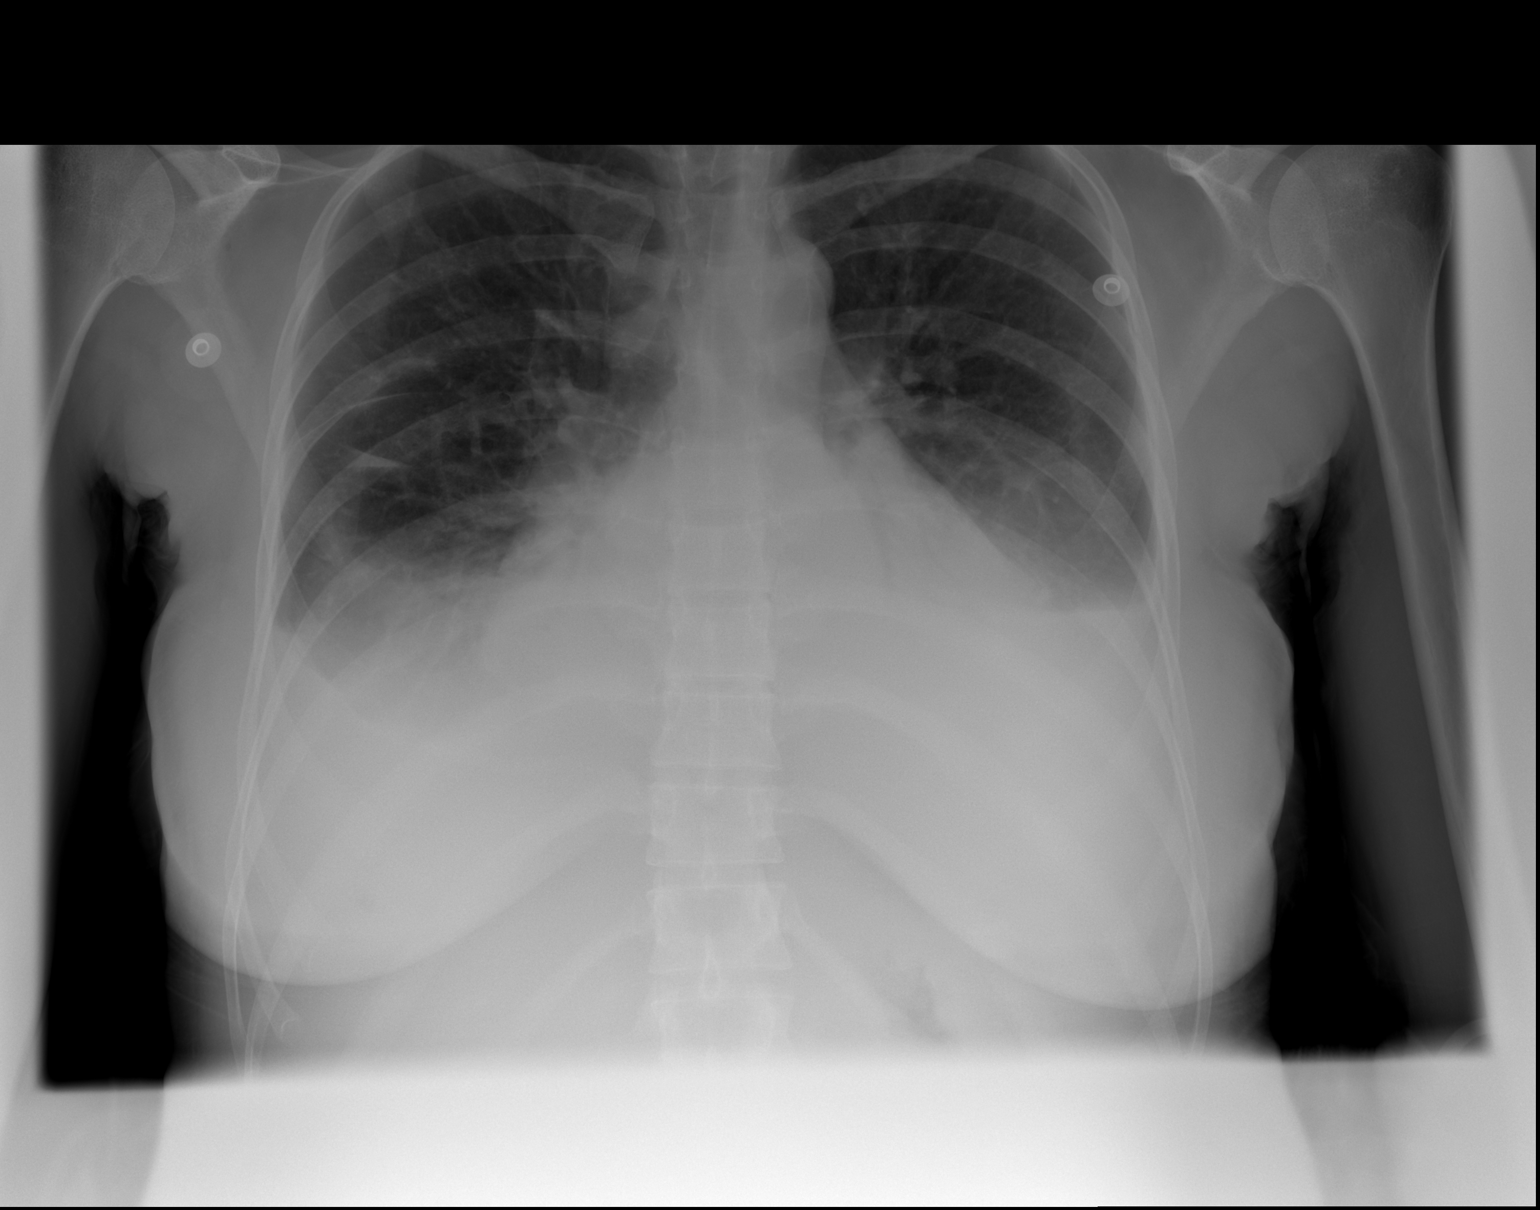

[2 of 2 positions shown; findings below may reference images not displayed]

FINDINGS: Enlargement of cardiac silhouette.
Mediastinal contours and pulmonary vascularity normal.
Bibasilar effusions and atelectasis increased since previous exam.
Unable to exclude underlying infiltrates in lower lobes.
Upper lungs clear.
No pneumothorax.
Bones unremarkable.
IMPRESSION: Enlargement of cardiac silhouette.
Increased bibasilar effusions and atelectasis.

## 2011-11-10 MED ORDER — ONDANSETRON HCL 4 MG/2ML IJ SOLN
INTRAMUSCULAR | Status: AC
  Administered 2011-11-10: 04:00:00
  Filled 2011-11-10: qty 2

## 2011-11-10 MED ORDER — HYDROMORPHONE HCL PF 1 MG/ML IJ SOLN
1.0000 mg | Freq: Once | INTRAMUSCULAR | Status: AC
  Administered 2011-11-10: 1 mg via INTRAVENOUS
  Filled 2011-11-10: qty 1

## 2011-11-10 MED ORDER — OXYCODONE-ACETAMINOPHEN 5-325 MG PO TABS: 1.0000 | ORAL_TABLET | ORAL | Status: DC | PRN

## 2011-11-10 MED ORDER — ONDANSETRON HCL 4 MG/2ML IJ SOLN
4.0000 mg | Freq: Once | INTRAMUSCULAR | Status: AC
  Administered 2011-11-10: 4 mg via INTRAVENOUS
  Filled 2011-11-10: qty 2

## 2011-11-10 MED ORDER — SODIUM CHLORIDE 0.9 % IV SOLN
INTRAVENOUS | Status: DC
  Administered 2011-11-10: 999 mL via INTRAVENOUS

## 2011-11-10 MED ORDER — SODIUM CHLORIDE 0.9 % IV BOLUS (SEPSIS)
500.0000 mL | Freq: Once | INTRAVENOUS | Status: AC
  Administered 2011-11-10: 500 mL via INTRAVENOUS

## 2011-11-10 MED ORDER — FENTANYL CITRATE 0.05 MG/ML IJ SOLN
100.0000 ug | Freq: Once | INTRAMUSCULAR | Status: AC
  Administered 2011-11-10: 100 ug via INTRAVENOUS
  Filled 2011-11-10: qty 2

## 2011-11-10 MED ORDER — ONDANSETRON HCL 4 MG/2ML IJ SOLN
INTRAMUSCULAR | Status: AC
  Administered 2011-11-10: 8 mg via INTRAMUSCULAR
  Filled 2011-11-10: qty 4

## 2011-11-10 NOTE — ED Notes (Signed)
MD at bedside. 

## 2011-11-10 NOTE — ED Notes (Signed)
Pt presents with nausea, vomiting and headache, htn noted on arrival. sts onset 6 hours ago. Given zofran by ems and feels better now

## 2011-11-10 NOTE — ED Notes (Signed)
Returned from ct scan and xray. nad ntoed, abc intact

## 2011-11-10 NOTE — Discharge Instructions (Signed)
Call Dr. Posey Pronto, for an appointment to be seen next week. Contact him sooner if needed for problems.  Abdominal Pain Abdominal pain can be caused by many things. Your caregiver decides the seriousness of your pain by an examination and possibly blood tests and X-rays. Many cases can be observed and treated at home. Most abdominal pain is not caused by a disease and will probably improve without treatment. However, in many cases, more time must pass before a clear cause of the pain can be found. Before that point, it may not be known if you need more testing, or if hospitalization or surgery is needed. HOME CARE INSTRUCTIONS   Do not take laxatives unless directed by your caregiver.   Take pain medicine only as directed by your caregiver.   Only take over-the-counter or prescription medicines for pain, discomfort, or fever as directed by your caregiver.   Try a clear liquid diet (broth, tea, or water) for as long as directed by your caregiver. Slowly move to a bland diet as tolerated.  SEEK IMMEDIATE MEDICAL CARE IF:   The pain does not go away.   You have a fever.   You keep throwing up (vomiting).   The pain is felt only in portions of the abdomen. Pain in the right side could possibly be appendicitis. In an adult, pain in the left lower portion of the abdomen could be colitis or diverticulitis.   You pass bloody or black tarry stools.  MAKE SURE YOU:   Understand these instructions.   Will watch your condition.   Will get help right away if you are not doing well or get worse.  Document Released: 05/31/2005 Document Revised: 08/10/2011 Document Reviewed: 04/08/2008 [facility] Patient Information 2012 [place].Headache, General, Unknown Cause The specific cause of your headache may not have been found today. There are many causes and types of headache. A few common ones are:  Tension headache.   Migraine.   Infections (examples: dental and sinus infections).   Bone  and/or joint problems in the neck or jaw.   Depression.   Eye problems.  These headaches are not life threatening.  Headaches can sometimes be diagnosed by a patient history and a physical exam. Sometimes, lab and imaging studies (such as x-ray and/or CT scan) are used to rule out more serious problems. In some cases, a spinal tap (lumbar puncture) may be requested. There are many times when your exam and tests may be normal on the first visit even when there is a serious problem causing your headaches. Because of that, it is very important to follow up with your doctor or local clinic for further evaluation. FINDING OUT THE RESULTS OF TESTS  If a radiology test was performed, a radiologist will review your results.   You will be contacted by the emergency department or your physician if any test results require a change in your treatment plan.   Not all test results may be available during your visit. If your test results are not back during the visit, make an appointment with your caregiver to find out the results. Do not assume everything is normal if you have not heard from your caregiver or the medical facility. It is important for you to follow up on all of your test results.  HOME CARE INSTRUCTIONS   Keep follow-up appointments with your caregiver, or any specialist referral.   Only take over-the-counter or prescription medicines for pain, discomfort, or fever as directed by your caregiver.  Biofeedback, massage, or other relaxation techniques may be helpful.   Ice packs or heat applied to the head and neck can be used. Do this three to four times per day, or as needed.   Call your doctor if you have any questions or concerns.   If you smoke, you should quit.  SEEK MEDICAL CARE IF:   You develop problems with medications prescribed.   You do not respond to or obtain relief from medications.   You have a change from the usual headache.   You develop nausea or vomiting.    SEEK IMMEDIATE MEDICAL CARE IF:   If your headache becomes severe.   You have an unexplained oral temperature above 102 F (38.9 C), or as your caregiver suggests.   You have a stiff neck.   You have loss of vision.   You have muscular weakness.   You have loss of muscular control.   You develop severe symptoms different from your first symptoms.   You start losing your balance or have trouble walking.   You feel faint or pass out.  MAKE SURE YOU:   Understand these instructions.   Will watch your condition.   Will get help right away if you are not doing well or get worse.  Document Released: 08/21/2005 Document Revised: 08/10/2011 Document Reviewed: 04/09/2008 [employer] Patient Information 2012 Exit[place], LL[place]ypertension Hypertension is another name for high blood pressure. High blood pressure may mean that your heart needs to work harder to pump blood. Blood pressure consists of two numbers, which includes a higher number over a lower number (example: 110/72). HOME CARE   Make lifestyle changes as told by your doctor. This may include weight loss and exercise.   Take your blood pressure medicine every day.   Limit how much salt you use.   Stop smoking if you smoke.   Do not use drugs.   Talk to your doctor if you are using decongestants or birth control pills. These medicines might make blood pressure higher.   Females should not drink more than 1 alcoholic drink per day. Males should not drink more than 2 alcoholic drinks per day.   See your doctor as told.  GET HELP RIGHT AWAY IF:   You have a blood pressure reading with a top number of 180 or higher.   You get a very bad headache.   You get blurred or changing vision.   You feel confused.   You feel weak, numb, or faint.   You get chest or belly (abdominal) pain.   You throw up (vomit).   You cannot breathe very well.  MAKE SURE YOU:   Understand these instructions.   Will watch your  condition.   Will get help right away if you are not doing well or get worse.  Document Released: 02/07/2008 Document Revised: 08/10/2011 Document Reviewed: 02/07/2008 [facility] Patient Information 2012 [place].Marland Kitchen

## 2011-11-10 NOTE — Procedures (Unsigned)
CEPHALIC VEIN MAPPING  INDICATION:  Chronic kidney disease stage 4.  HISTORY: Diabetes.  EXAM:  The right cephalic vein is compressible.  Diameter measurements range from 0.16 to 0.37 cm.  The right basilic vein is compressible.  Diameter measurements range from 0.15 to 0.26 cm.  The left cephalic vein is compressible.  Diameter measurements range from 0.13 to 0.26 cm.  The left basilic vein appears absent.  See attached worksheet for all measurements.  IMPRESSION: 1. Patent bilateral cephalic and right basilic veins with diameter     measurements as described above. 2. Left basilic vein is not visualized, note made of a dual arterial     system in the left upper arm.  ___________________________________________ Conrad Ashford, MD  SH/MEDQ  D:  11/03/2011  T:  11/03/2011  Job:  [id number]

## 2011-11-10 NOTE — ED Notes (Signed)
Pt is aware of need of urine specimen; pt at this time ambulatory to the bathroom to attempt to provide an urine specimen

## 2011-11-10 NOTE — ED Provider Notes (Signed)
History     CSN: [id number]  Arrival date & time 11/10/11  [id number]   First MD Initiated Contact with Patient 11/10/11 [id number]      Chief Complaint  Patient presents with  . Nausea  . Emesis  . Headache    (Consider location/radiation/quality/duration/timing/severity/associated sxs/prior treatment) HPI Comments: [name] [name] is a 31 y.o. Female who has had headache for 6 hours. She states it awoke her from sleep. She is also had nausea and vomiting. She has had upper abdominal pain, and no diarrhea. She reports feeling hot today, but did not take her temperature at home. She has no cough, chest pain, back pain, weakness, dizziness or trouble walking. She was treated by EMS with Zofran with improvement in nausea.  The history is provided by the patient.    Past Medical History  Diagnosis Date  . DIABETES MELLITUS, TYPE I, UNCONTROLLED 11/05/2007  . MEDIAL EPICONDYLITIS, LEFT 12/17/2007  . Vision loss   . Renal disorder   . Diabetes mellitus   . Hypertension     Past Surgical History  Procedure Date  . Refractive surgery   . Eye surgery     Family History  Problem Relation Age of Onset  . Hypertension Father     History  Substance Use Topics  . Smoking status: Former Smoker -- 1 years    Types: Cigarettes  . Smokeless tobacco: Never Used  . Alcohol Use: No    OB History    Grav Para Term Preterm Abortions TAB SAB Ect Mult Living                  Review of Systems  All other systems reviewed and are negative.    Allergies  Review of patient's allergies indicates no known allergies.  Home Medications   Current Outpatient Rx  Name Route Sig Dispense Refill  . CARVEDILOL 25 MG PO TABS Oral Take 25 mg by mouth 2 (two) times daily with a meal.    . DILTIAZEM HCL ER COATED BEADS 240 MG PO CP24 Oral Take 240 mg by mouth daily.    . FUROSEMIDE 40 MG PO TABS Oral Take 2 tablets (80 mg total) by mouth daily. 30 tablet 0  . HYDRALAZINE HCL 50 MG PO TABS Oral Take 50 mg  by mouth 2 (two) times daily.    . INSULIN LISPRO PROT & LISPRO (75-25) 100 UNIT/ML  SUSP Subcutaneous Inject 8-16 Units into the skin 2 (two) times daily with a meal. 8 units with breakfast, and 16 units with evening meal.    . LEVOTHYROXINE SODIUM 50 MCG PO TABS Oral Take 50 mcg by mouth daily.    . INSULIN PEN NEEDLE 31G X 8 MM MISC Does not apply 1 each by Does not apply route 2 (two) times daily. 60 each 11  . ZOFRAN PO Oral Take by mouth.    . OXYCODONE-ACETAMINOPHEN 5-325 MG PO TABS Oral Take 1 tablet by mouth every 4 (four) hours as needed for pain. 30 tablet 0    BP 185/101  Pulse 96  Temp(Src) 98.6 F (37 C) (Oral)  Resp 18  SpO2 96%  LMP 11/03/2011  Physical Exam  Nursing note and vitals reviewed. Constitutional: She is oriented to person, place, and time. She appears well-developed and well-nourished. She appears distressed (she appears uncomfortable).  HENT:  Head: Normocephalic and atraumatic.  Eyes: Conjunctivae and EOM are normal. Pupils are equal, round, and reactive to light.  Neck: Normal range of motion and  phonation normal. Neck supple.       No meningismus  Cardiovascular: Normal rate, regular rhythm and intact distal pulses.   Pulmonary/Chest: Effort normal and breath sounds normal. She exhibits no tenderness.  Abdominal: Soft. She exhibits no distension and no mass. There is tenderness (Mild diffuse epigastric tenderness). There is no rebound and no guarding.  Musculoskeletal: Normal range of motion.  Neurological: She is alert and oriented to person, place, and time. She has normal strength. She exhibits normal muscle tone.  Skin: Skin is warm and dry.  Psychiatric: She has a normal mood and affect. Her behavior is normal. Judgment and thought content normal.    ED Course  Procedures (including critical care time) Emergency department treatment: IV fluid bolus and drip. IV, fentanyl and Zofran.      Labs Reviewed  CBC - Abnormal; Notable for the  following:    RBC 3.12 (*)    Hemoglobin 9.3 (*)    HCT 26.8 (*)    All other components within normal limits  BASIC METABOLIC PANEL - Abnormal; Notable for the following:    CO2 18 (*)    Glucose, Bld 286 (*)    BUN 55 (*)    Creatinine, Ser 6.97 (*)    GFR calc non Af Amer 7 (*)    GFR calc Af Amer 8 (*)    All other components within normal limits  URINALYSIS, ROUTINE W REFLEX MICROSCOPIC - Abnormal; Notable for the following:    APPearance CLOUDY (*)    Glucose, UA >1000 (*)    Hgb urine dipstick SMALL (*)    Protein, ur >300 (*)    All other components within normal limits  PROTIME-INR - Abnormal; Notable for the following:    Prothrombin Time 15.8 (*)    All other components within normal limits  URINE MICROSCOPIC-ADD ON - Abnormal; Notable for the following:    Squamous Epithelial / LPF MANY (*)    All other components within normal limits  DIFFERENTIAL  LIPASE, BLOOD  URINE CULTURE   Dg Chest 2 View  11/10/2011  *RADIOLOGY REPORT*  Clinical Data: Weakness, shortness of breath, headache, nausea, vomiting, hypertension, diabetes  CHEST - 2 VIEW  Comparison: 10/30/2011  Findings: Enlargement of cardiac silhouette. Mediastinal contours and pulmonary vascularity normal. Bibasilar effusions and atelectasis increased since previous exam. Unable to exclude underlying infiltrates in lower lobes. Upper lungs clear. No pneumothorax. Bones unremarkable.  IMPRESSION: Enlargement of cardiac silhouette. Increased bibasilar effusions and atelectasis.  Original Report Authenticated By: Burnetta Sabin, M.D.   Ct Head Wo Contrast  11/10/2011  *RADIOLOGY REPORT*  Clinical Data: Left side headache for 6-7 hours, nausea, vomiting, blurred vision  CT HEAD WITHOUT CONTRAST  Technique:  Contiguous axial images were obtained from the base of the skull through the vertex without contrast.  Comparison: None  Findings: Normal ventricular morphology. No midline shift or mass effect. Normal appearance of  brain parenchyma. No intracranial hemorrhage, mass lesion, or acute infarction. Visualized paranasal sinuses and mastoid air cells clear. Bones unremarkable.  IMPRESSION: No acute intracranial abnormalities.  Original Report Authenticated By: Burnetta Sabin, M.D.   Contacted Dr. Posey Pronto, her Nephrologist,  he is familiar with her and her prior episodes of headache, abdominal pain. He is comfortable with her going home with pain control medications, and he'll see her in the office next week  1. Headache   2. Renal insufficiency   3. Abdominal pain   4. Hypertension  MDM  Nonspecific headache. Patient in ED 10/30/11, and had Lasix increased to 80 mg daily. Her creatinine, and BUN are high, indicating relative dehydration versus worsening chronic renal insufficiency. Doubt hyper tensive, urgency, intracranial, leaving, colitis, serious intra-abdominal infection or bleeding. Patient stable for discharge with outpatient management  Plan: Home Medications- Percocet; Home Treatments- continue usual medications; Recommended follow up- Nephrology next week.         Richarda Blade, MD 11/10/11 1719

## 2011-11-10 NOTE — ED Notes (Signed)
Complains of nausea and vomiting for 6 hours, sts severe left side headache, with ems was retching. sts headache present for 6 hours also. 222/100 initial bp hx of same, sts cbg 265. ST on monitor. zofran given 20 g in left ac. Edema present in bilateral legs.

## 2011-11-11 LAB — URINE CULTURE

## 2011-11-13 ENCOUNTER — Encounter (HOSPITAL_COMMUNITY): Payer: PRIVATE HEALTH INSURANCE

## 2011-11-14 ENCOUNTER — Other Ambulatory Visit: Payer: Self-pay

## 2011-11-15 ENCOUNTER — Emergency Department (HOSPITAL_COMMUNITY): Payer: Medicaid Other

## 2011-11-15 ENCOUNTER — Encounter (HOSPITAL_COMMUNITY): Payer: Self-pay | Admitting: Internal Medicine

## 2011-11-15 ENCOUNTER — Inpatient Hospital Stay (HOSPITAL_COMMUNITY)
Admission: EM | Admit: 2011-11-15 | Discharge: 2011-11-27 | DRG: 674 | Disposition: A | Discharge status: Home / Self Care | Payer: Medicaid Other | Source: Ambulatory Visit | Attending: Internal Medicine | Admitting: Internal Medicine

## 2011-11-15 DIAGNOSIS — H543 Unqualified visual loss, both eyes: Secondary | ICD-10-CM | POA: Diagnosis present

## 2011-11-15 DIAGNOSIS — Z87891 Personal history of nicotine dependence: Secondary | ICD-10-CM

## 2011-11-15 DIAGNOSIS — I3139 Other pericardial effusion (noninflammatory): Secondary | ICD-10-CM | POA: Diagnosis present

## 2011-11-15 DIAGNOSIS — IMO0002 Reserved for concepts with insufficient information to code with codable children: Secondary | ICD-10-CM | POA: Diagnosis not present

## 2011-11-15 DIAGNOSIS — N189 Chronic kidney disease, unspecified: Secondary | ICD-10-CM

## 2011-11-15 DIAGNOSIS — N185 Chronic kidney disease, stage 5: Secondary | ICD-10-CM | POA: Diagnosis present

## 2011-11-15 DIAGNOSIS — E11319 Type 2 diabetes mellitus with unspecified diabetic retinopathy without macular edema: Secondary | ICD-10-CM | POA: Diagnosis present

## 2011-11-15 DIAGNOSIS — M7700 Medial epicondylitis, unspecified elbow: Secondary | ICD-10-CM

## 2011-11-15 DIAGNOSIS — I120 Hypertensive chronic kidney disease with stage 5 chronic kidney disease or end stage renal disease: Secondary | ICD-10-CM | POA: Diagnosis present

## 2011-11-15 DIAGNOSIS — N19 Unspecified kidney failure: Secondary | ICD-10-CM

## 2011-11-15 DIAGNOSIS — E8770 Fluid overload, unspecified: Secondary | ICD-10-CM | POA: Diagnosis present

## 2011-11-15 DIAGNOSIS — I319 Disease of pericardium, unspecified: Secondary | ICD-10-CM | POA: Diagnosis present

## 2011-11-15 DIAGNOSIS — E1129 Type 2 diabetes mellitus with other diabetic kidney complication: Secondary | ICD-10-CM | POA: Diagnosis present

## 2011-11-15 DIAGNOSIS — N3090 Cystitis, unspecified without hematuria: Secondary | ICD-10-CM | POA: Diagnosis not present

## 2011-11-15 DIAGNOSIS — R1013 Epigastric pain: Secondary | ICD-10-CM | POA: Diagnosis present

## 2011-11-15 DIAGNOSIS — N186 End stage renal disease: Secondary | ICD-10-CM | POA: Diagnosis present

## 2011-11-15 DIAGNOSIS — R112 Nausea with vomiting, unspecified: Secondary | ICD-10-CM

## 2011-11-15 DIAGNOSIS — E1029 Type 1 diabetes mellitus with other diabetic kidney complication: Principal | ICD-10-CM | POA: Diagnosis present

## 2011-11-15 DIAGNOSIS — R000 Tachycardia, unspecified: Secondary | ICD-10-CM | POA: Diagnosis not present

## 2011-11-15 DIAGNOSIS — E8779 Other fluid overload: Secondary | ICD-10-CM | POA: Diagnosis present

## 2011-11-15 DIAGNOSIS — Z794 Long term (current) use of insulin: Secondary | ICD-10-CM

## 2011-11-15 DIAGNOSIS — I313 Pericardial effusion (noninflammatory): Secondary | ICD-10-CM | POA: Diagnosis present

## 2011-11-15 DIAGNOSIS — Z79899 Other long term (current) drug therapy: Secondary | ICD-10-CM

## 2011-11-15 DIAGNOSIS — I10 Essential (primary) hypertension: Secondary | ICD-10-CM | POA: Diagnosis present

## 2011-11-15 DIAGNOSIS — E1039 Type 1 diabetes mellitus with other diabetic ophthalmic complication: Secondary | ICD-10-CM | POA: Diagnosis present

## 2011-11-15 DIAGNOSIS — E1065 Type 1 diabetes mellitus with hyperglycemia: Principal | ICD-10-CM | POA: Diagnosis present

## 2011-11-15 DIAGNOSIS — J90 Pleural effusion, not elsewhere classified: Secondary | ICD-10-CM | POA: Diagnosis present

## 2011-11-15 DIAGNOSIS — E039 Hypothyroidism, unspecified: Secondary | ICD-10-CM | POA: Diagnosis present

## 2011-11-15 DIAGNOSIS — N039 Chronic nephritic syndrome with unspecified morphologic changes: Secondary | ICD-10-CM | POA: Diagnosis present

## 2011-11-15 DIAGNOSIS — D631 Anemia in chronic kidney disease: Secondary | ICD-10-CM | POA: Diagnosis present

## 2011-11-15 LAB — CBC
Platelets: 152 10*3/uL (ref 150–400)
RBC: 3.36 MIL/uL — ABNORMAL LOW (ref 3.87–5.11)
WBC: 7.5 10*3/uL (ref 4.0–10.5)

## 2011-11-15 LAB — DIFFERENTIAL
Lymphocytes Relative: 10 % — ABNORMAL LOW (ref 12–46)
Lymphs Abs: 0.8 10*3/uL (ref 0.7–4.0)
Neutrophils Relative %: 87 % — ABNORMAL HIGH (ref 43–77)

## 2011-11-15 LAB — URINALYSIS, ROUTINE W REFLEX MICROSCOPIC
Glucose, UA: 500 mg/dL — AB
Leukocytes, UA: NEGATIVE
Protein, ur: >300 mg/dL — AB
pH: 6 (ref 5.0–8.0)

## 2011-11-15 LAB — GLUCOSE, CAPILLARY: Glucose-Capillary: 263 mg/dL — ABNORMAL HIGH (ref 70–99)

## 2011-11-15 LAB — COMPREHENSIVE METABOLIC PANEL
BUN: 53 mg/dL — ABNORMAL HIGH (ref 6–23)
CO2: 17 mEq/L — ABNORMAL LOW (ref 19–32)
Chloride: 105 mEq/L (ref 96–112)
Creatinine, Ser: 6.63 mg/dL — ABNORMAL HIGH (ref 0.50–1.10)
GFR calc non Af Amer: 8 mL/min — ABNORMAL LOW (ref >90)
Total Bilirubin: 0.2 mg/dL — ABNORMAL LOW (ref 0.3–1.2)

## 2011-11-15 LAB — URINE MICROSCOPIC-ADD ON

## 2011-11-15 IMAGING — US US ABDOMEN COMPLETE
1 series · 13 of 25 positions shown · non-contrast
Comparison: CT abdomen pelvis [DATE]

CLINICAL DATA: Epigastric pain. Rule out biliary disease. Chronic
renal failure. Type 1 diabetes.

ABDOMINAL ULTRASOUND COMPLETE

[Series 1: us abdomen complete · 0.30mm/px · 13 of 46 slices shown]
[im 1/46]
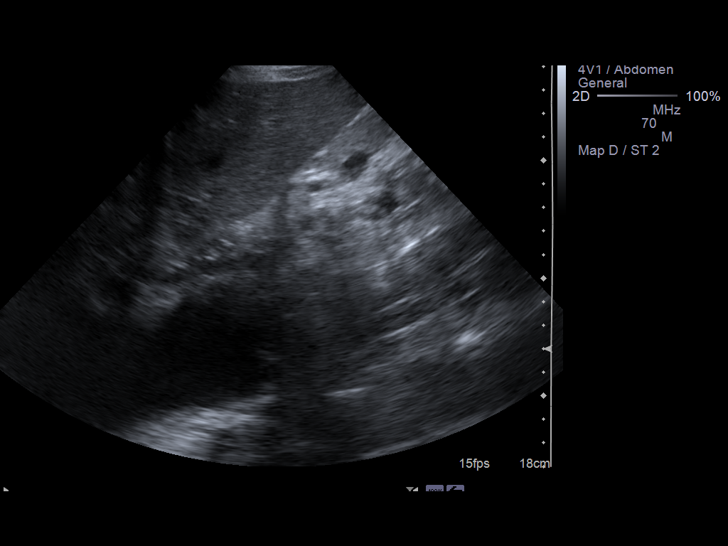
[im 4/46]
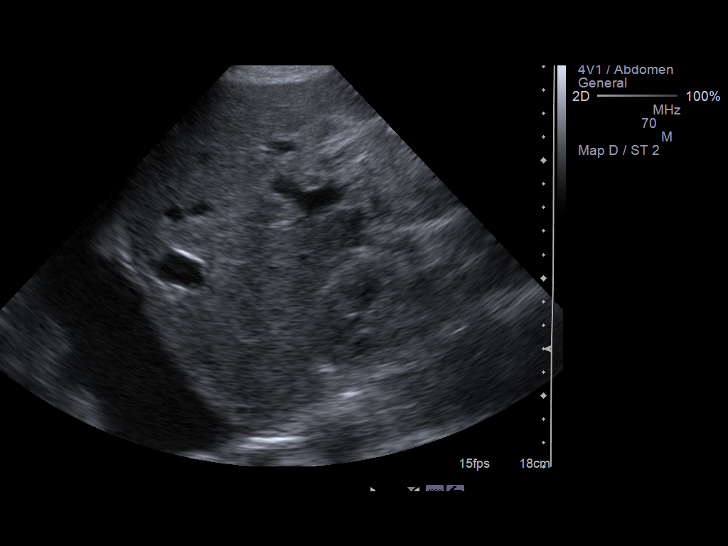
[im 8/46]
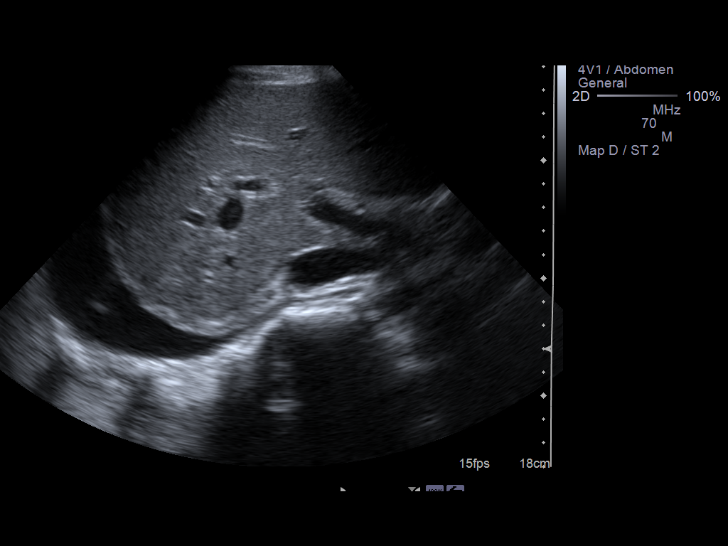
[im 12/46]
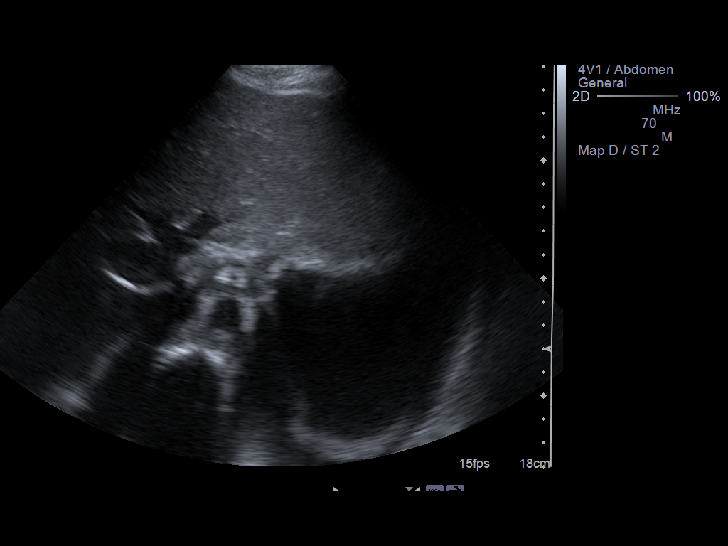
[im 16/46]
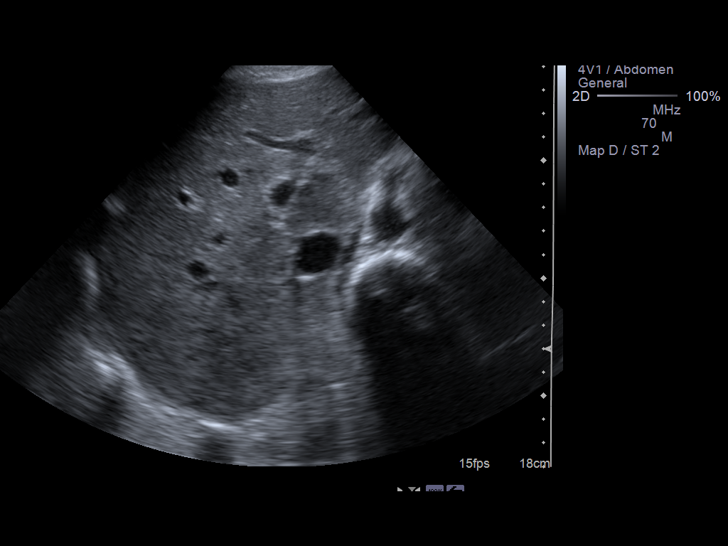
[im 19/46]
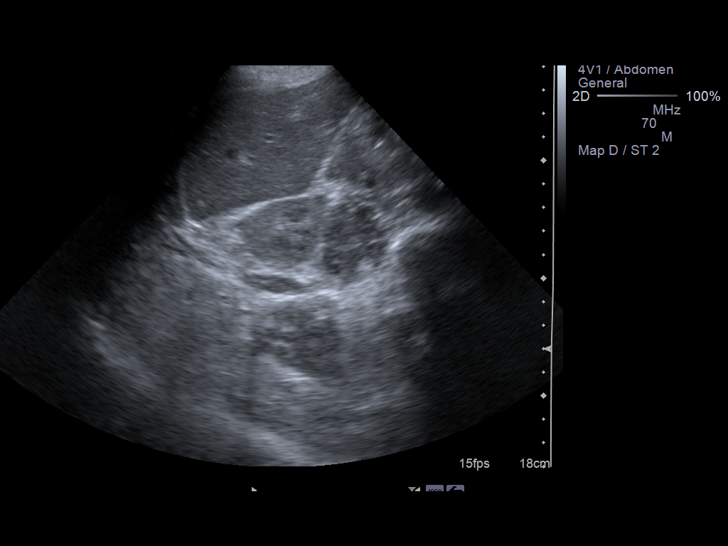
[im 23/46]
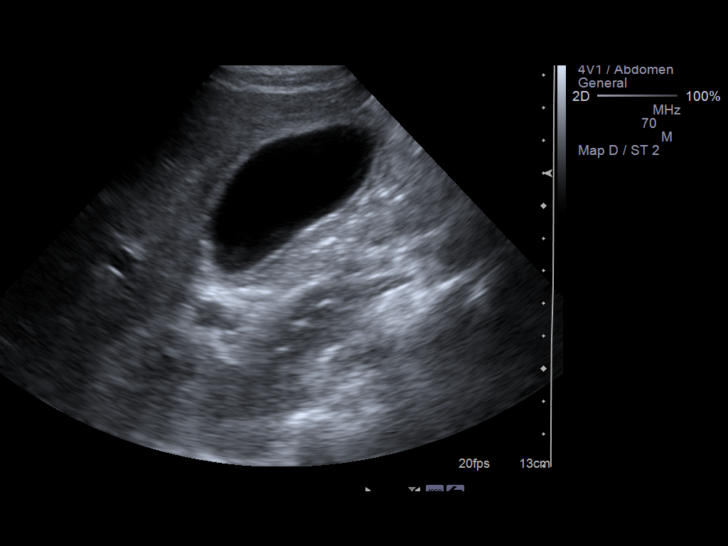
[im 27/46]
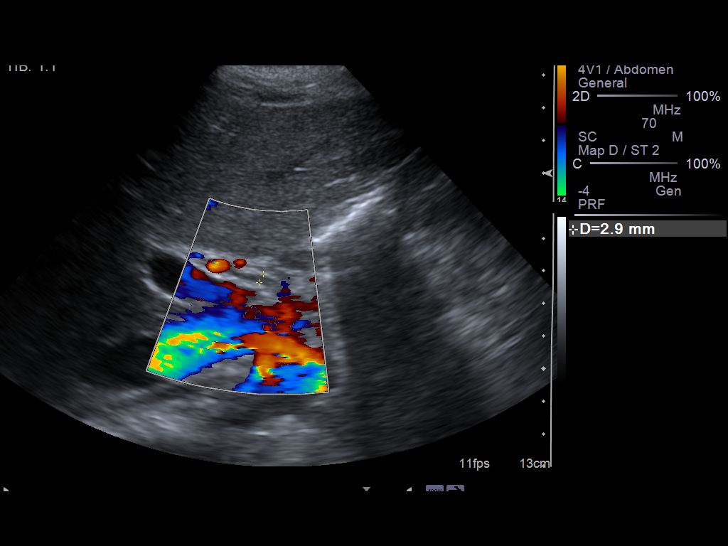
[im 31/46]
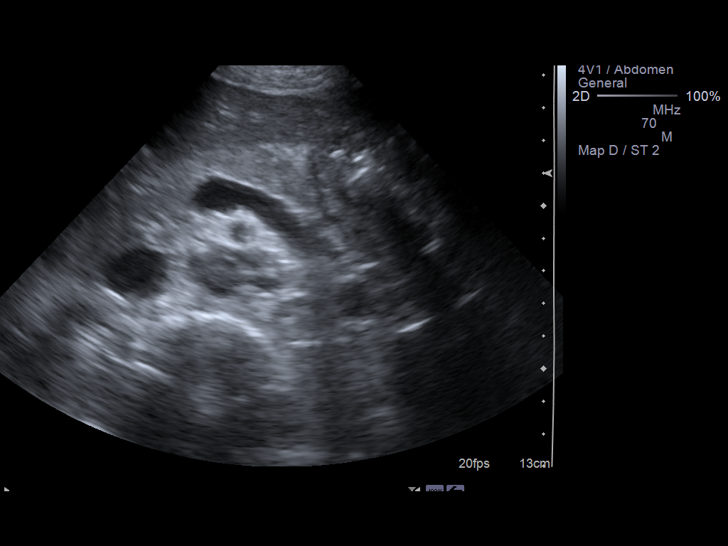
[im 34/46]
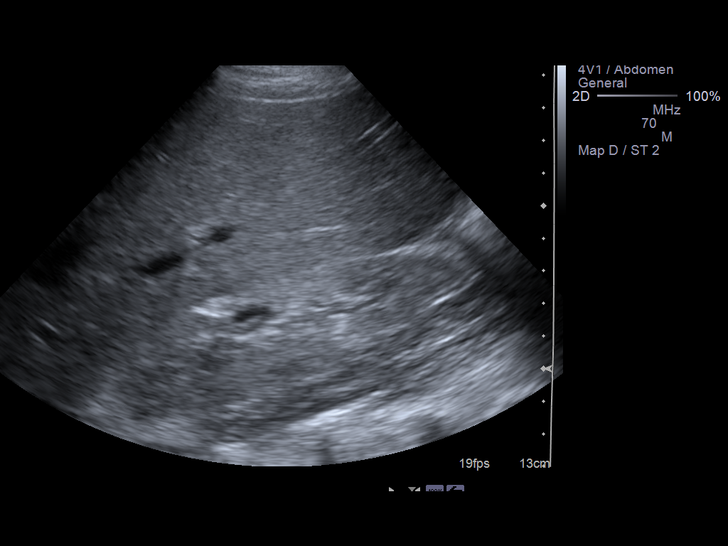
[im 38/46]
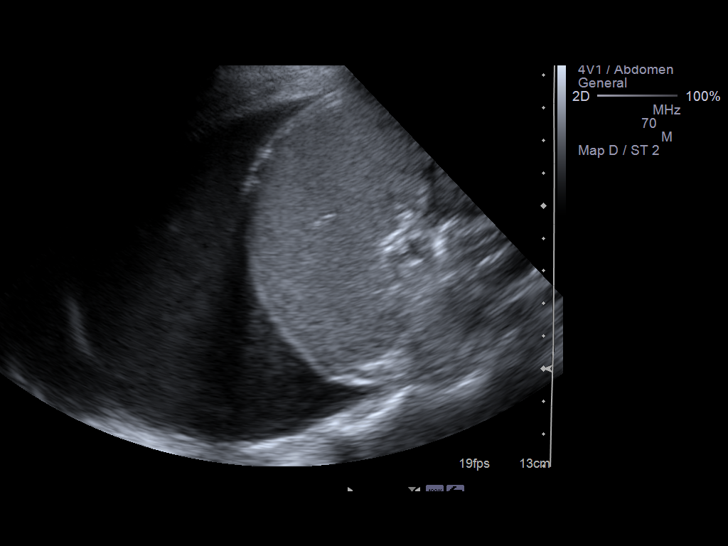
[im 42/46]
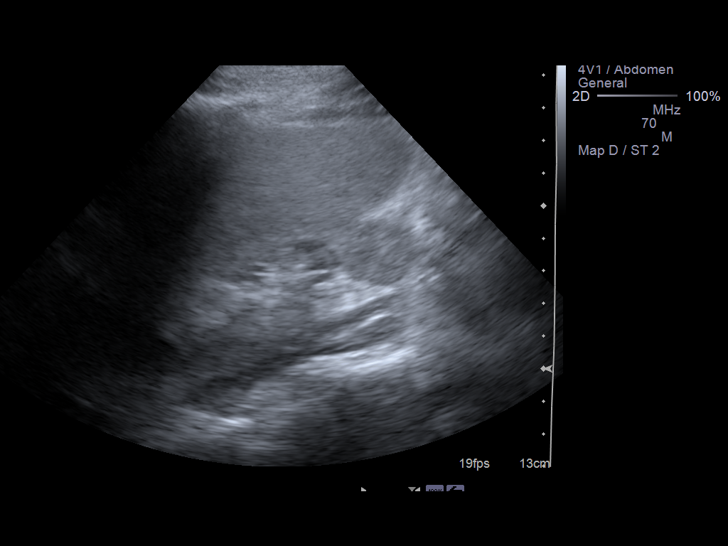
[im 46/46]
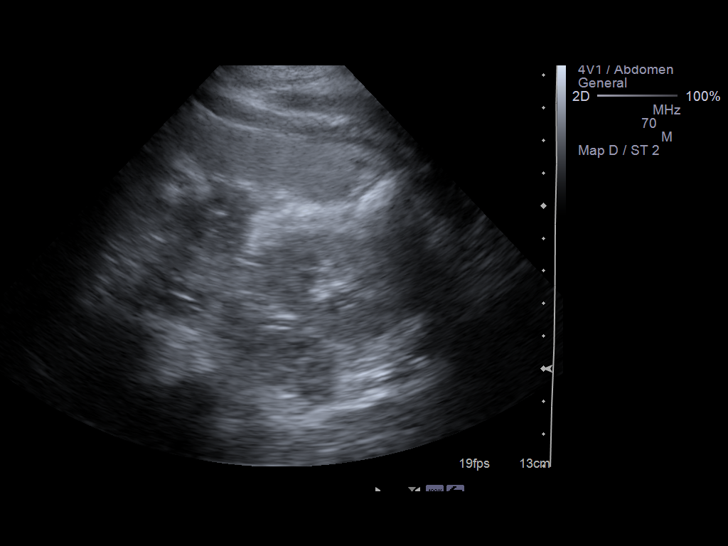

[13 of 25 positions shown; findings below may reference images not displayed]

FINDINGS: Inferior chest:  A significant pericardial effusion is partially
visualized.  Prominent bilateral pleural effusions are noted.

Gallbladder:  The gallbladder wall thickness measures slightly
prominent at 3.5 mm.  No gallstones or sludge is seen.  No definite
sonographic Murphy's sign is seen.  The sonographer reports the
patient has generalized abdominal pain with palpation.

Common Bile Duct:  Within normal limits in caliber. Measures
mm.

Liver: No focal mass lesion identified.  Within normal limits in
parenchymal echogenicity.

IVC:  Appears normal.

Pancreas:  Although the pancreas is difficult to visualize in its
entirety, no focal pancreatic abnormality is identified.

Spleen:  Measures greater than 13.9 cm in length (cannot be
completely imaged on one ultrasound screen).  Calculated splenic
volume is estimated to be 600 cm cubed.  No focal splenic lesion.

Right kidney:  Normal in size.  Measures 12.1 cm.  Increased
echogenicity of the right kidney. No evidence of mass or
hydronephrosis.

Left kidney:  Normal in size.  Measures 12.2 cm.  Increased
echogenicity of the left kidney.  No evidence of mass or
hydronephrosis.

Abdominal Aorta:  No aneurysm identified.

No ascites is identified on these images.
IMPRESSION: 1.  Significant pericardial effusion noted.
2.  Large bilateral pleural effusions.
3.  Mild diffuse gallbladder wall thickening.  Negative for stones
or sludge.  Findings are nonspecific, and can be related to other
entitities besides cholecystitis, such as hypoproteinemia, liver
disease, pancreatitis, congestive heart failure, ascites, etc.eight
calculus acute cholecystitis cannot be completely excluded; nuclear
medicine imaging could be performed, if clinically indicated.
4.  Splenomegaly.
5.  Increased echogenicity of both kidneys consistent with history
of chronic renal failure.

## 2011-11-15 MED ORDER — PROMETHAZINE HCL 25 MG/ML IJ SOLN
25.0000 mg | INTRAMUSCULAR | Status: AC
  Administered 2011-11-15: 25 mg via INTRAVENOUS
  Filled 2011-11-15: qty 1

## 2011-11-15 MED ORDER — SODIUM CHLORIDE 0.9 % IV BOLUS (SEPSIS): 1000.0000 mL | Freq: Once | INTRAVENOUS | Status: DC

## 2011-11-15 MED ORDER — INSULIN ASPART 100 UNIT/ML ~~LOC~~ SOLN
0.0000 [IU] | SUBCUTANEOUS | Status: DC
  Administered 2011-11-16: 1 [IU] via SUBCUTANEOUS
  Filled 2011-11-15: qty 1

## 2011-11-15 MED ORDER — SODIUM CHLORIDE 0.9 % IV BOLUS (SEPSIS)
500.0000 mL | Freq: Once | INTRAVENOUS | Status: AC
  Administered 2011-11-15: 500 mL via INTRAVENOUS

## 2011-11-15 MED ORDER — LABETALOL HCL 5 MG/ML IV SOLN
INTRAVENOUS | Status: AC
  Administered 2011-11-15: 20 mg via INTRAVENOUS
  Filled 2011-11-15: qty 4

## 2011-11-15 MED ORDER — INSULIN GLARGINE 100 UNIT/ML ~~LOC~~ SOLN
12.0000 [IU] | Freq: Every day | SUBCUTANEOUS | Status: DC
  Administered 2011-11-15 – 2011-11-16 (×2): 12 [IU] via SUBCUTANEOUS
  Filled 2011-11-15: qty 1
  Filled 2011-11-15: qty 0.12

## 2011-11-15 MED ORDER — CALCIUM CARBONATE ANTACID 500 MG PO CHEW
1.0000 | CHEWABLE_TABLET | Freq: Three times a day (TID) | ORAL | Status: DC
  Administered 2011-11-17 – 2011-11-27 (×24): 200 mg via ORAL
  Filled 2011-11-15 (×35): qty 1

## 2011-11-15 MED ORDER — LIDOCAINE HCL (PF) 1 % IJ SOLN: 5.0000 mL | INTRAMUSCULAR | Status: DC | PRN

## 2011-11-15 MED ORDER — ALTEPLASE 2 MG IJ SOLR: 2.0000 mg | Freq: Once | INTRAMUSCULAR | Status: AC | PRN

## 2011-11-15 MED ORDER — SODIUM CHLORIDE 0.9 % IV SOLN
INTRAVENOUS | Status: DC
  Filled 2011-11-15: qty 1

## 2011-11-15 MED ORDER — HEPARIN SODIUM (PORCINE) 1000 UNIT/ML DIALYSIS
1000.0000 [IU] | INTRAMUSCULAR | Status: DC | PRN
  Filled 2011-11-15: qty 1

## 2011-11-15 MED ORDER — MORPHINE SULFATE 4 MG/ML IJ SOLN
6.0000 mg | Freq: Once | INTRAMUSCULAR | Status: AC
  Administered 2011-11-15: 6 mg via INTRAVENOUS
  Filled 2011-11-15 (×2): qty 1

## 2011-11-15 MED ORDER — FUROSEMIDE 10 MG/ML IJ SOLN
80.0000 mg | Freq: Once | INTRAMUSCULAR | Status: AC
  Administered 2011-11-15: 80 mg via INTRAVENOUS
  Filled 2011-11-15: qty 8

## 2011-11-15 MED ORDER — DARBEPOETIN ALFA-POLYSORBATE 100 MCG/0.5ML IJ SOLN: 100.0000 ug | INTRAMUSCULAR | Status: DC

## 2011-11-15 MED ORDER — MORPHINE SULFATE 4 MG/ML IJ SOLN
6.0000 mg | Freq: Once | INTRAMUSCULAR | Status: AC
  Administered 2011-11-15: 6 mg via INTRAVENOUS
  Filled 2011-11-15: qty 2

## 2011-11-15 MED ORDER — HEPARIN SODIUM (PORCINE) 1000 UNIT/ML DIALYSIS
40.0000 [IU]/kg | INTRAMUSCULAR | Status: DC | PRN
  Filled 2011-11-15: qty 3

## 2011-11-15 MED ORDER — ONDANSETRON HCL 4 MG/2ML IJ SOLN
INTRAMUSCULAR | Status: AC
  Administered 2011-11-15: 15:00:00
  Filled 2011-11-15: qty 2

## 2011-11-15 MED ORDER — PENTAFLUOROPROP-TETRAFLUOROETH EX AERO: 1.0000 "application " | INHALATION_SPRAY | CUTANEOUS | Status: DC | PRN

## 2011-11-15 MED ORDER — HYDRALAZINE HCL 20 MG/ML IJ SOLN
10.0000 mg | INTRAMUSCULAR | Status: AC
  Administered 2011-11-15: 10 mg via INTRAVENOUS
  Filled 2011-11-15: qty 0.5

## 2011-11-15 MED ORDER — LIDOCAINE-PRILOCAINE 2.5-2.5 % EX CREA: 1.0000 "application " | TOPICAL_CREAM | CUTANEOUS | Status: DC | PRN

## 2011-11-15 MED ORDER — LABETALOL HCL 5 MG/ML IV SOLN
20.0000 mg | Freq: Once | INTRAVENOUS | Status: AC
  Administered 2011-11-15: 20 mg via INTRAVENOUS

## 2011-11-15 MED ORDER — SODIUM CHLORIDE 0.9 % IV SOLN: 100.0000 mL | INTRAVENOUS | Status: DC | PRN

## 2011-11-15 MED ORDER — NEPRO/CARBSTEADY PO LIQD: 237.0000 mL | ORAL | Status: DC | PRN

## 2011-11-15 NOTE — ED Notes (Signed)
Pt back from CT

## 2011-11-15 NOTE — ED Provider Notes (Signed)
Medical screening examination/treatment/procedure(s) were conducted as a shared visit with non-physician practitioner(s) and myself.  I personally evaluated the patient during the encounter  Patient with persistent symptoms of right upper quadrant abdominal pain and nausea vomiting.  Patient has known renal failure and is being set up with vascular surgery for plans for fistula or graft surgery.  Patient was seen by her nephrologist Dr. Posey Pronto yesterday and encouraged him into the emergency department if she had persistent symptoms.  Patient presents because of her persistent symptoms.  She's been unable to keep her blood pressure medications down which is likely the exhalation for her elevated blood pressure here today.  We'll give her IV blood pressure medication as well as antiemetics and pain medications here in the emergency department.  We are considering cholecystitis and have obtained an ultrasound to further evaluate for this.  Patient will likely require admission for control of her symptoms and her renal failure.  [name] Octave, MD 11/15/11 1800

## 2011-11-15 NOTE — ED Notes (Signed)
Patient states pain 8/10 sharp pain middle upper abdomen.  Resting comfortably on stretcher. States wants to sleep a little.

## 2011-11-15 NOTE — Consult Note (Signed)
Nephrology consultation note.  Consulted by: Dr. Thad Ranger- emergency room M.D. Consulted for: Evaluation and management of advanced chronic kidney disease-likely need for renal replacement.  History of presenting illness: [name] [name] is a 31 year old woman of [place] origin whom I have been following for the last 4-5 months at the nephrology clinic for advancing chronic kidney disease secondary to underlying insulin-dependent diabetes (with associated retinopathy) and hypertension. In the recent few weeks, she has had increasing difficulty with volume control for which we attempted medical management with limited success. Over the past one week, she has presented twice to the emergency room for evaluation and management of abdominal pain and now presents with abdominal pain (epigastric/right upper quadrant) with associated nausea and vomiting limiting oral intake including medications. Radiological studies in the emergency room indicate a thickened gallbladder wall however, more significantly shown the presence of a pericardial effusion as well as pleural effusions bilaterally.  She had been scheduled to undergo access placement in preparation for hemodialysis about 2 weeks ago however, postponed this for social reasons. She consequently agreed to have access placement on 11/20/2011.   Past Medical History  Diagnosis Date  . DIABETES MELLITUS, TYPE I, UNCONTROLLED 11/05/2007  . MEDIAL EPICONDYLITIS, LEFT 12/17/2007  . Vision loss   . Renal disorder   . Diabetes mellitus   . Hypertension    Medications:    . furosemide  80 mg Intravenous Once  . hydrALAZINE  10 mg Intravenous To Major  . labetalol  20 mg Intravenous Once  .  morphine injection  6 mg Intravenous Once  .  morphine injection  6 mg Intravenous Once  . ondansetron      . promethazine  25 mg Intravenous To ER  . sodium chloride  500 mL Intravenous Once  . sodium chloride  500 mL Intravenous Once  . DISCONTD: sodium chloride  1,000 mL  Intravenous Once   No Known Allergies  Past Surgical History  Procedure Date  . Refractive surgery   . Eye surgery    History   Social History  . Marital Status: Single    Spouse Name: N/A    Number of Children: N/A  . Years of Education: N/A   Occupational History  . Not on file.   Social History Main Topics  . Smoking status: Former Smoker -- 1 years    Types: Cigarettes  . Smokeless tobacco: Never Used  . Alcohol Use: No  . Drug Use: No  . Sexually Active: Yes    Birth Control/ Protection: Pill   Other Topics Concern  . Not on file   Social History Narrative   Worked for [employer]--as did husband. Had to leave [place] for safety reasons. Has been in [place] since 9/09.   Family History  Problem Relation Age of Onset  . Hypertension Father    Review of Systems  Constitutional: Positive for chills, weight loss and malaise/fatigue. Negative for fever and diaphoresis.  HENT: Negative for hearing loss, ear pain, nosebleeds, congestion, sore throat, neck pain, tinnitus and ear discharge.   Eyes: Positive for blurred vision. Negative for double vision, photophobia, pain, discharge and redness.  Respiratory: Positive for cough, sputum production and shortness of breath. Negative for hemoptysis, wheezing and stridor.   Cardiovascular: Positive for chest pain, palpitations, orthopnea, leg swelling and PND. Negative for claudication.  Gastrointestinal: Positive for heartburn, nausea, vomiting and abdominal pain. Negative for diarrhea, constipation, blood in stool and melena.  Genitourinary: Negative.  Negative for dysuria, urgency and frequency.  Musculoskeletal: Positive for back pain. Negative for myalgias, joint pain and falls.  Skin: Negative for itching and rash.  Neurological: Positive for weakness and headaches. Negative for dizziness, tingling, tremors, sensory change, speech change, focal weakness, seizures and loss of consciousness.  Endo/Heme/Allergies: Negative.  Does not  bruise/bleed easily.  Psychiatric/Behavioral: The patient is nervous/anxious.   All other systems reviewed and are negative.  Physical Exam  Nursing note and vitals reviewed. Constitutional: She is oriented to person, place, and time. She appears well-developed and well-nourished. She appears distressed.  HENT:  Head: Normocephalic and atraumatic.  Right Ear: External ear normal.  Left Ear: External ear normal.  Mouth/Throat: Oropharynx is clear and moist. No oropharyngeal exudate.  Eyes: EOM are normal. Pupils are equal, round, and reactive to light. Right eye exhibits no discharge. Left eye exhibits no discharge. No scleral icterus.  Neck: Normal range of motion. Neck supple. JVD present. No tracheal deviation present. No thyromegaly present.  Cardiovascular: Exam reveals no gallop and no friction rub.   No murmur heard.      Regular tachycardia, muffled heart sounds-no gallop or rub appreciated   Pulmonary/Chest: Effort normal. No respiratory distress. She has no wheezes. She has rales. She exhibits no tenderness.  Abdominal: Bowel sounds are normal. She exhibits no mass. There is tenderness. There is rebound and guarding.       Notable epigastric and right upper quadrant tenderness with some rebound. Guarding noted. No palpable organomegaly.  Musculoskeletal: Normal range of motion. She exhibits edema.       3-4+ lower extremity pitting edema extending to the thighs  Lymphadenopathy:    She has no cervical adenopathy.  Neurological: She is alert and oriented to person, place, and time.       No asterixis is appreciated  Skin: Skin is warm and dry. No rash noted. No erythema. No pallor.  Psychiatric: She has a normal mood and affect. Her behavior is normal. Judgment normal.   BMET    Component Value Date/Time   NA 135 11/15/2011 1552   K 5.1 11/15/2011 1552   CL 105 11/15/2011 1552   CO2 17* 11/15/2011 1552   GLUCOSE 272* 11/15/2011 1552   BUN 53* 11/15/2011 1552   CREATININE 6.63*  11/15/2011 1552   CALCIUM 8.5 11/15/2011 1552   GFRNONAA 8* 11/15/2011 1552   GFRAA 9* 11/15/2011 1552    CBC    Component Value Date/Time   WBC 7.5 11/15/2011 1552   RBC 3.36* 11/15/2011 1552   HGB 10.2* 11/15/2011 1552   HCT 29.9* 11/15/2011 1552   PLT 152 11/15/2011 1552   MCV 89.0 11/15/2011 1552   MCH 30.4 11/15/2011 1552   MCHC 34.1 11/15/2011 1552   RDW 14.2 11/15/2011 1552   LYMPHSABS 0.8 11/15/2011 1552   MONOABS 0.2 11/15/2011 1552   EOSABS 0.0 11/15/2011 1552   BASOSABS 0.0 11/15/2011 1552   Radiological studies- 1. Significant pericardial effusion noted.  2. Large bilateral pleural effusions.  3. Mild diffuse gallbladder wall thickening. Negative for stones  or sludge. Findings are nonspecific, and can be related to other  entitities besides cholecystitis, such as hypoproteinemia, liver  disease, pancreatitis, congestive heart failure, ascites, etc.eight  calculus acute cholecystitis cannot be completely excluded; nuclear  medicine imaging could be performed, if clinically indicated.  4. Splenomegaly.  5. Increased echogenicity of both kidneys consistent with history  of chronic renal failure.  Assessment and plan: 1: Advanced chronic kidney disease with volume overload difficult/refractory to treat with medical  management: I discussed the difficulty of the situation with Doshia and she is in agreement with starting dialysis. To this effect, I will go ahead and schedule her for placement of a tunneled hemodialysis catheter tomorrow by interventional radiology. She will thereafter undergo hemodialysis. Anticipate placement of permanent hemodialysis access prior to her discharge from the hospital as we pursue an outpatient the hemodialysis slot. Anticipate volume management with hemodialysis would likely give [place] faster results in relieving her pericardial and pleural effusions. We'll need to monitor her closely for features of tamponade that would require surgical management of  pericardial effusion. She would be best monitored in a step down or telemetry unit for now.  2. Abdominal pain: Probable cholecystitis versus gastritis-workup and management per hospitalist service. Suspect that she will likely be n.p.o. for now. 3. Anemia of chronic kidney disease: We'll restart her erythropoietin therapy and monitor closely-we'll repeat iron studies (recently repleted in February) 4. Metabolic bone disease: Check a calcium, phosphorus and magnesium level in a.m. labs. Reconcile our data with PTH that was previously checked to determine need for vitamin D receptor analogs.

## 2011-11-15 NOTE — ED Notes (Signed)
Catheter inserted per sterile procedure

## 2011-11-15 NOTE — ED Provider Notes (Signed)
History     CSN: [id number]  Arrival date & time 11/15/11  1500   First MD Initiated Contact with Patient 11/15/11 1506      Chief Complaint  Patient presents with  . Abdominal Pain    (Consider location/radiation/quality/duration/timing/severity/associated sxs/prior treatment) HPI Comments: Saw Dr. Posey Pronto yesterday and was told to come to the ED if symptoms worsen. This Friday was supposed to get access for dialysis at the short stay 7:30AM. He said to have him paged if she doesn't feel well so she can be admitted and the procedure can be done now.   Patient is a 31 y.o. female presenting with abdominal pain. The history is provided by the patient.  Abdominal Pain The primary symptoms of the illness include abdominal pain, shortness of breath, nausea and vomiting. The primary symptoms of the illness do not include fever, fatigue, diarrhea, hematemesis, hematochezia, dysuria, vaginal discharge or vaginal bleeding. The current episode started 6 to 12 hours ago. The onset of the illness was sudden. The problem has not changed since onset. The abdominal pain is located in the epigastric region. The abdominal pain radiates to the RUQ. The severity of the abdominal pain is 10/10. The abdominal pain is relieved by certain positions and being still. The abdominal pain is exacerbated by vomiting, movement and fatty foods.  Episode onset: feels like heart is racing after she thows up, causes SOB. The shortness of breath is mild. The patient's medical history does not include CHF, COPD, asthma or chronic lung disease.   Associated with: threw up 4-5 times, not associated with anything  The patient states that she believes she is currently not pregnant. The patient has not had a change in bowel habit. Additional symptoms associated with the illness include anorexia. Symptoms associated with the illness do not include chills, diaphoresis, heartburn, constipation, urgency, hematuria, frequency or back pain.  Significant associated medical issues include diabetes. Significant associated medical issues do not include PUD, GERD, inflammatory bowel disease, sickle cell disease, gallstones, liver disease, substance abuse, diverticulitis, HIV or cardiac disease.    Past Medical History  Diagnosis Date  . DIABETES MELLITUS, TYPE I, UNCONTROLLED 11/05/2007  . MEDIAL EPICONDYLITIS, LEFT 12/17/2007  . Vision loss   . Renal disorder   . Diabetes mellitus   . Hypertension     Past Surgical History  Procedure Date  . Refractive surgery   . Eye surgery     Family History  Problem Relation Age of Onset  . Hypertension Father     History  Substance Use Topics  . Smoking status: Former Smoker -- 1 years    Types: Cigarettes  . Smokeless tobacco: Never Used  . Alcohol Use: No    OB History    Grav Para Term Preterm Abortions TAB SAB Ect Mult Living                  Review of Systems  Constitutional: Positive for appetite change. Negative for fever, chills, diaphoresis and fatigue.  HENT: Negative for neck stiffness and dental problem.   Eyes: Negative for visual disturbance.  Respiratory: Positive for shortness of breath. Negative for cough, chest tightness and wheezing.   Cardiovascular: Negative for chest pain.  Gastrointestinal: Positive for nausea, vomiting, abdominal pain and anorexia. Negative for heartburn, diarrhea, constipation, blood in stool, hematochezia, abdominal distention, anal bleeding, rectal pain and hematemesis.  Genitourinary: Negative for dysuria, urgency, frequency, hematuria, flank pain, vaginal bleeding and vaginal discharge.  Musculoskeletal: Negative for myalgias, back pain  and arthralgias.  Skin: Negative for rash.  Neurological: Negative for dizziness, syncope, speech difficulty, numbness and headaches.  Hematological: Does not bruise/bleed easily.  All other systems reviewed and are negative.    Allergies  Review of patient's allergies indicates no known  allergies.  Home Medications   Current Outpatient Rx  Name Route Sig Dispense Refill  . DILTIAZEM HCL ER COATED BEADS 240 MG PO CP24 Oral Take 240 mg by mouth daily.    . FUROSEMIDE 40 MG PO TABS Oral Take 40 mg by mouth 2 (two) times daily.    Marland Kitchen HYDRALAZINE HCL 50 MG PO TABS Oral Take 50 mg by mouth 2 (two) times daily.    . INSULIN LISPRO PROT & LISPRO (75-25) 100 UNIT/ML  SUSP Subcutaneous Inject 8-16 Units into the skin 2 (two) times daily with a meal. 8 units with breakfast, and 16 units with evening meal.    . INSULIN PEN NEEDLE 31G X 8 MM MISC Does not apply 1 each by Does not apply route 2 (two) times daily. 60 each 11  . LEVOTHYROXINE SODIUM 50 MCG PO TABS Oral Take 50 mcg by mouth daily.      BP 186/103  Pulse 121  Temp(Src) 98.7 F (37.1 C) (Oral)  Resp 21  SpO2 94%  LMP 11/03/2011  Physical Exam  Nursing note and vitals reviewed. Constitutional: Vital signs are normal. She appears well-developed and well-nourished. No distress.  HENT:  Head: Normocephalic and atraumatic.  Mouth/Throat: Uvula is midline, oropharynx is clear and moist and mucous membranes are normal.  Eyes: Conjunctivae and EOM are normal. Pupils are equal, round, and reactive to light.  Neck: Normal range of motion and full passive range of motion without pain. Neck supple. No spinous process tenderness and no muscular tenderness present. No rigidity. No Brudzinski's sign noted.  Cardiovascular: Normal rate and regular rhythm.   Pulmonary/Chest: Effort normal and breath sounds normal. No accessory muscle usage. Not tachypneic. No respiratory distress.  Abdominal: Soft. Normal appearance. She exhibits no distension, no ascites, no pulsatile midline mass and no mass. There is tenderness (diffuse, but localizes in epigastric and RUQ) in the right upper quadrant and epigastric area. There is no CVA tenderness. No hernia.    Lymphadenopathy:    She has no cervical adenopathy.  Neurological: She is alert.    Skin: Skin is warm and dry. No rash noted. She is not diaphoretic.  Psychiatric: She has a normal mood and affect. Her speech is normal and behavior is normal.    ED Course  Procedures (including critical care time)  Pt is tachy and HTN complaining of diffuse abd pain that localizes in Epigastric region. [place] and lipase ordered to r/o GB etiology. Labs to check for organ failure. HTN treated w hydralazine, small fluids. Will discuss pitting edema and possible lasix dose w her nephrologist when labs result. Pt likely to be admitted for dialysis, HTN and pain mngt.   Labs Reviewed  COMPREHENSIVE METABOLIC PANEL - Abnormal; Notable for the following:    CO2 17 (*)    Glucose, Bld 272 (*)    BUN 53 (*)    Creatinine, Ser 6.63 (*)    Albumin 2.8 (*)    Total Bilirubin 0.2 (*)    GFR calc non Af Amer 8 (*)    GFR calc Af Amer 9 (*)    All other components within normal limits  CBC - Abnormal; Notable for the following:    RBC 3.36 (*)  Hemoglobin 10.2 (*)    HCT 29.9 (*)    All other components within normal limits  DIFFERENTIAL - Abnormal; Notable for the following:    Neutrophils Relative 87 (*)    Lymphocytes Relative 10 (*)    Monocytes Relative 2 (*)    All other components within normal limits  URINALYSIS, ROUTINE W REFLEX MICROSCOPIC - Abnormal; Notable for the following:    APPearance CLOUDY (*)    Glucose, UA 500 (*)    Hgb urine dipstick MODERATE (*)    Protein, ur >300 (*)    All other components within normal limits  GLUCOSE, CAPILLARY - Abnormal; Notable for the following:    Glucose-Capillary 263 (*)    All other components within normal limits  URINE MICROSCOPIC-ADD ON - Abnormal; Notable for the following:    Squamous Epithelial / LPF MANY (*)    Bacteria, UA FEW (*)    Casts GRANULAR CAST (*)    All other components within normal limits  LIPASE, BLOOD  POCT PREGNANCY, URINE   US Abdomen Complete  11/15/2011  *RADIOLOGY REPORT*  Clinical Data:  Epigastric  pain. Rule out biliary disease. Chronic renal failure. Type 1 diabetes.  ABDOMINAL ULTRASOUND COMPLETE  Comparison:  CT abdomen pelvis 02/20/2010  Findings:  Inferior chest:  A significant pericardial effusion is partially visualized.  Prominent bilateral pleural effusions are noted.  Gallbladder:  The gallbladder wall thickness measures slightly prominent at 3.5 mm.  No gallstones or sludge is seen.  No definite sonographic Murphy's sign is seen.  The sonographer reports the patient has generalized abdominal pain with palpation.  Common Bile Duct:  Within normal limits in caliber. Measures 2.9 mm.  Liver: No focal mass lesion identified.  Within normal limits in parenchymal echogenicity.  IVC:  Appears normal.  Pancreas:  Although the pancreas is difficult to visualize in its entirety, no focal pancreatic abnormality is identified.  Spleen:  Measures greater than 13.9 cm in length (cannot be completely imaged on one ultrasound screen).  Calculated splenic volume is estimated to be 600 cm cubed.  No focal splenic lesion.  Right kidney:  Normal in size.  Measures 12.1 cm.  Increased echogenicity of the right kidney. No evidence of mass or hydronephrosis.  Left kidney:  Normal in size.  Measures 12.2 cm.  Increased echogenicity of the left kidney.  No evidence of mass or hydronephrosis.  Abdominal Aorta:  No aneurysm identified.  No ascites is identified on these images.  IMPRESSION:  1.  Significant pericardial effusion noted. 2.  Large bilateral pleural effusions. 3.  Mild diffuse gallbladder wall thickening.  Negative for stones or sludge.  Findings are nonspecific, and can be related to other entitities besides cholecystitis, such as hypoproteinemia, liver disease, pancreatitis, congestive heart failure, ascites, etc.eight calculus acute cholecystitis cannot be completely excluded; nuclear medicine imaging could be performed, if clinically indicated. 4.  Splenomegaly. 5.  Increased echogenicity of both kidneys  consistent with history of chronic renal failure.  Original Report Authenticated By: Curlene Dolphin, M.D.    Date: 11/15/2011  Rate: 102  Rhythm: sinus tachy  QRS Axis: normal  Intervals: normal  ST/T Wave abnormalities: normal  Conduction Disutrbances: none  Narrative Interpretation: Borderline t wave abnormalities (flat t waves?)  Old EKG Reviewed: No significant changes noted     No diagnosis found.  Spoke with Dr. Posey Pronto, nephrology, who requests patient be admitted to the internal medicine service and he will consult patient.  Patient is due to be set  up for dialysis catheter placement for tomorrow so she can start receiving dialysis.  80 mg IV Lasix was given for effusions and edema.  Patient will be kept on a monitor do to paracardial effusion.    MDM  ESRD- chronic, pending dialysis Significant pericardial effusion- likely uremic etiology Pleural effusions, bilateral HTN Abdominal pain N/V  Consults Cardiology: Dr. Haroldine Laws, Moderate pericardial effusion, will see pt and schedule for echo Nephrology: Dr. Posey Pronto, will see in ED and set up for dialysis cath tmw.   Pt to be admitted to [place], PA-C 11/15/11 2047

## 2011-11-15 NOTE — ED Notes (Signed)
Report received from Samantha, RN. 

## 2011-11-15 NOTE — H&P (Addendum)
PCP:  None Nephrology [facility]     Chief Complaint:  Abdominal pain  HPI: [name] [name] is a 31 y.o. female   has a past medical history of DIABETES MELLITUS, TYPE I, UNCONTROLLED (11/05/2007); MEDIAL EPICONDYLITIS, LEFT (12/17/2007); Vision loss; Renal disorder; Diabetes mellitus; and Hypertension.   Presented with  12 hours of severe abdominal pain mainly epigastric sharp. She has chronic abdominal pain that is more like cramps. She have had nausea and vomiting daily. No hematoemesis. No blood in stool. She has chills but no fever. Some shortness of breath no worse than baseline. No chest pains. Have had swelling for months.   Review of Systems:    Pertinent positives include:chills, increased weight (5-6 lb)  and swelling, Orthopnea,, anasarca, dizziness nausea, vomiting, shortness of breath at rest. loss of appetite,   Constitutional:  No weight loss, night sweats, Fevers,  fatigue, weight loss  HEENT:  No headaches, Difficulty swallowing,Tooth/dental problems,Sore throat,  No sneezing, itching, ear ache, nasal congestion, post nasal drip,  Cardio-vascular:  No chest pain,  PND, palpitations.no Bilateral lower extremity swelling  GI:  No heartburn, indigestion, abdominal pain, diarrhea, change in bowel habits, melena, blood in stool, hematoemesis Resp:  no  No dyspnea on exertion, No excess mucus, no productive cough, No non-productive cough, No coughing up of blood.No change in color of mucus.No wheezing. Skin:  no rash or lesions. No jaundice GU:  no dysuria, change in color of urine, no urgency or frequency. No straining to urinate.  No flank pain.  Musculoskeletal:  No joint pain or no joint swelling. No decreased range of motion. No back pain.  Psych:  No change in mood or affect. No depression or anxiety. No memory loss.  Neuro: no localizing neurological complaints, no tingling, no weakness, no double vision, no gait abnormality, no slurred speech, no confusion  Otherwise  ROS are negative except for above, 10 systems were reviewed  Past Medical History: Past Medical History  Diagnosis Date  . DIABETES MELLITUS, TYPE I, UNCONTROLLED 11/05/2007  . MEDIAL EPICONDYLITIS, LEFT 12/17/2007  . Vision loss   . Renal disorder   . Diabetes mellitus   . Hypertension    Past Surgical History  Procedure Date  . Refractive surgery   . Eye surgery      Medications: Prior to Admission medications   Medication Sig Start Date End Date Taking? Authorizing Provider  diltiazem (CARDIZEM CD) 240 MG 24 hr capsule Take 240 mg by mouth daily.   Yes Historical Provider, MD  furosemide (LASIX) 40 MG tablet Take 40 mg by mouth 2 (two) times daily. 10/30/11 10/29/12 Yes Tiffany Marilu Favre, PA  hydrALAZINE (APRESOLINE) 50 MG tablet Take 50 mg by mouth 2 (two) times daily.   Yes Historical Provider, MD  insulin lispro protamine-insulin lispro (HUMALOG 75/25) (75-25) 100 UNIT/ML SUSP Inject 8-16 Units into the skin 2 (two) times daily with a meal. 8 units with breakfast, and 16 units with evening meal.   Yes Historical Provider, MD  Insulin Pen Needle 31G X 8 MM MISC 1 each by Does not apply route 2 (two) times daily. 04/11/11  Yes Renato Shin, MD  levothyroxine (SYNTHROID, LEVOTHROID) 50 MCG tablet Take 50 mcg by mouth daily.   Yes Historical Provider, MD    Allergies:  No Known Allergies  Social History:  Ambulatory independently  Lives at home with boyfriend no children   reports that she has quit smoking. Her smoking use included Pipe. She has never used smokeless tobacco.  She reports that she does not drink alcohol or use illicit drugs.   Family History: family history includes Hypertension in her father.    Physical Exam: Patient Vitals for the past 24 hrs:  BP Temp Temp src Pulse Resp SpO2  11/15/11 1917 175/100 mmHg - - - 16  96 %  11/15/11 1838 186/103 mmHg - - - - -  11/15/11 1750 199/110 mmHg 98.7 F (37.1 C) Oral 121  21  94 %  11/15/11 1643 215/117 mmHg 98.4 F  (36.9 C) - 121  16  94 %    1. General:  in No Acute distress 2. Psychological: Alert and Oriented 3. Head/ENT:    Dry Mucous Membranes                          Head Non traumatic, neck supple                          Normal Dentition 4. SKIN: normal  Skin turgor,  Skin clean Dry and intact no rash 5. Heart: Regular rate and rhythm rapid no Murmur, Rub or gallop 6. Lungs: Clear to auscultation bilaterally, no wheezes or crackles   7. Abdomen: Soft, some generalized tenderness worse in epigastric area, slightly  distended 8. Lower extremities: no clubbing, cyanosis, 3+edema up to thighs 9. Neurologically Grossly intact, moving all 4 extremities equally 10. MSK: Normal range of motion  body mass index is unknown because there is no height or weight on file.   Labs on Admission:   [facility] 11/15/11 1552  NA 135  K 5.1  CL 105  CO2 17*  GLUCOSE 272*  BUN 53*  CREATININE 6.63*  CALCIUM 8.5  MG --  PHOS --    Basename 11/15/11 1552  AST 9  ALT 10  ALKPHOS 74  BILITOT 0.2*  PROT 6.0  ALBUMIN 2.8*    Basename 11/15/11 1552  LIPASE 22  AMYLASE --    Basename 11/15/11 1552  WBC 7.5  NEUTROABS 6.5  HGB 10.2*  HCT 29.9*  MCV 89.0  PLT 152   No results found for this basename: CKTOTAL:3,CKMB:3,CKMBINDEX:3,TROPONINI:3 in the last 72 hours No results found for this basename: TSH,T4TOTAL,FREET3,T3FREE,THYROIDAB in the last 72 hours No results found for this basename: VITAMINB12:2,FOLATE:2,FERRITIN:2,TIBC:2,IRON:2,RETICCTPCT:2 in the last 72 hours Lab Results  Component Value Date   HGBA1C  Value: 10.8 (NOTE)                                                                       According to the ADA Clinical Practice Recommendations for 2011, when HbA1c is used as a screening test:   >=6.5%   Diagnostic of Diabetes Mellitus           (if abnormal result  is confirmed)  5.7-6.4%   Increased risk of developing Diabetes Mellitus  References:Diagnosis and Classification of  Diabetes Mellitus,Diabetes [id number] 1):S62-S69 and Standards of Medical Care in         Diabetes - 2011,Diabetes [id number]  (Suppl 1):S11-S61.* 04/22/2010    The CrCl is unknown because both a height and weight (above a minimum accepted value) are required for this calculation. ABG  Component Value Date/Time   PHART 7.407* 04/22/2010 0717   HCO3 23.1 04/22/2010 0717   TCO2 20 10/30/2011 1847   ACIDBASEDEF 1.0 04/22/2010 0717   O2SAT 97.0 04/22/2010 0717     No results found for this basename: DDIMER     Other results: I personal reviewed this ECG  HR 102 Sinus tachycardia, diminished QRS no evidence of ischemia  UA no infection   Cultures:    Component Value Date/Time   SDES URINE, RANDOM 11/10/2011 0734   SPECREQUEST NONE 11/10/2011 0734   CULT NO GROWTH 11/10/2011 0734   REPTSTATUS 11/11/2011 FINAL 11/10/2011 0734       Radiological Exams on Admission: US Abdomen Complete  11/15/2011  *RADIOLOGY REPORT*  Clinical Data:  Epigastric pain. Rule out biliary disease. Chronic renal failure. Type 1 diabetes.  ABDOMINAL ULTRASOUND COMPLETE  Comparison:  CT abdomen pelvis 02/20/2010  Findings:  Inferior chest:  A significant pericardial effusion is partially visualized.  Prominent bilateral pleural effusions are noted.  Gallbladder:  The gallbladder wall thickness measures slightly prominent at 3.5 mm.  No gallstones or sludge is seen.  No definite sonographic Murphy's sign is seen.  The sonographer reports the patient has generalized abdominal pain with palpation.  Common Bile Duct:  Within normal limits in caliber. Measures 2.9 mm.  Liver: No focal mass lesion identified.  Within normal limits in parenchymal echogenicity.  IVC:  Appears normal.  Pancreas:  Although the pancreas is difficult to visualize in its entirety, no focal pancreatic abnormality is identified.  Spleen:  Measures greater than 13.9 cm in length (cannot be completely imaged on one ultrasound screen).  Calculated  splenic volume is estimated to be 600 cm cubed.  No focal splenic lesion.  Right kidney:  Normal in size.  Measures 12.1 cm.  Increased echogenicity of the right kidney. No evidence of mass or hydronephrosis.  Left kidney:  Normal in size.  Measures 12.2 cm.  Increased echogenicity of the left kidney.  No evidence of mass or hydronephrosis.  Abdominal Aorta:  No aneurysm identified.  No ascites is identified on these images.  IMPRESSION:  1.  Significant pericardial effusion noted. 2.  Large bilateral pleural effusions. 3.  Mild diffuse gallbladder wall thickening.  Negative for stones or sludge.  Findings are nonspecific, and can be related to other entitities besides cholecystitis, such as hypoproteinemia, liver disease, pancreatitis, congestive heart failure, ascites, etc.eight calculus acute cholecystitis cannot be completely excluded; nuclear medicine imaging could be performed, if clinically indicated. 4.  Splenomegaly. 5.  Increased echogenicity of both kidneys consistent with history of chronic renal failure.  Original Report Authenticated By: Curlene Dolphin, M.D.    Assessment/Plan 31 yo F w history of DM type 1, HTN and CKD stage V here with abdominal pain and sign of fluid overload. Will likely require HD during this admittion.   Present on Admission:  .Fluid overload - As per Renal will have HD catheter placed tomorrow for HD initiation, ED given 80 of Lasix without much result so far will repeat and place foley, strict I/O .Pericardial effusion - likely related to uremia and fluid overload, will watch in step down for any sign of hemodynamic instability or signs of Tamponade. Cardiology st bedside seeing her. Order echo.  .Hypertension - currently improved with BP going down form 200 to 160's range, should further improve with fluid control and HD .End stage renal disease - as per Renal will likely need HD in am or sooner if decompensates .DIABETES MELLITUS,  TYPE I, UNCONTROLLED - very poorly  controlled patient would need to Follow up with Endocrine. For now will put on glucose stabilizer for better control  check blood sugar q 4h. She would benefit from diabetes clinic follow up. Of note her blood sugars have improved at this point we'll switch her to Lantus 12 units at night and continue to monitor closely but will not need insulin drip.  .Abdominal pain, epigastric - gallbladder wall edema, but no evidence of obstruction or cholecystitis. This could be part Fluid overload picture, also gastritis should be considered given epigastric pain, no evidence of pancreatitis. Will put on protonix IV   Prophylaxis: SCD  Protonix  CODE STATUS: FULL CODE  I have spent a total of 90 minutes in this admission  Ineta Sinning 11/15/2011, 8:21 PM

## 2011-11-15 NOTE — Consult Note (Signed)
Cardiology IP Consult  Reason for Consult: pericardial effusion Referring Physician: Hosmer,K  HPI: [name] [name] is a 31 y.o.female from [place] with ESRD secondary to type I DM who presents to the ED tonight with mid epigastric pain and n/v after trying to take her medicine.  She has had increasing difficult with this and with LE edema and abdominal fullness lately.  She is followed by Dr. Posey Pronto with nephrology who has been giving her intermittent injections of lasix 120mg . The abdominal fullness and pain has been happening more frequently lately.  She is also a little more SOB than her baseline.  She notes chills only after vomiting.  She underwent an abdominal u/s in ED today and was noted to have a pericardial effusion on this.  Otherwise her GB was mildly thickened and her spleen was enlarged.  She has had no chest pain or syncope.  Overall now she feels ok and is asking to eat.   Past Medical History  Diagnosis Date  . DIABETES MELLITUS, TYPE I, UNCONTROLLED 11/05/2007  . MEDIAL EPICONDYLITIS, LEFT 12/17/2007  . Vision loss   . Renal disorder   . Diabetes mellitus   . Hypertension     Past Surgical History  Procedure Date  . Refractive surgery   . Eye surgery     Family History  Problem Relation Age of Onset  . Hypertension Father     Social History:  reports that she has quit smoking. Her smoking use included Pipe. She has never used smokeless tobacco. She reports that she does not drink alcohol or use illicit drugs.  Allergies: No Known Allergies  Current Facility-Administered Medications  Medication Dose Route Frequency Provider Last Rate Last Dose  . 0.9 %  sodium chloride infusion  100 mL Intravenous PRN Jay K. Posey Pronto, MD      . 0.9 %  sodium chloride infusion  100 mL Intravenous PRN Ulice Dash K. Posey Pronto, MD      . alteplase (CATHFLO ACTIVASE) injection 2 mg  2 mg Intracatheter Once PRN Ulice Dash K. Posey Pronto, MD      . calcium carbonate (TUMS - dosed in mg elemental calcium) chewable tablet 200  mg of elemental calcium  1 tablet Oral TID WC Jay K. Posey Pronto, MD      . darbepoetin [facility]) injection 100 mcg  100 mcg Subcutaneous Q Fri-1800 Jay K. Posey Pronto, MD      . feeding supplement (NEPRO CARB STEADY) liquid 237 mL  237 mL Oral PRN Ulice Dash K. Posey Pronto, MD      . furosemide (LASIX) injection 80 mg  80 mg Intravenous Once [employer]	[place], PA-C   80 mg at 11/15/11 1926  . heparin injection 1,000 Units  1,000 Units Dialysis PRN Clayborne Dana. Posey Pronto, MD      . heparin injection 40 Units/kg  40 Units/kg Dialysis PRN Ulice Dash K. Posey Pronto, MD      . hydrALAZINE (APRESOLINE) injection 10 mg  10 mg Intravenous To Major Lisette Paz, PA-C   10 mg at 11/15/11 1838  . labetalol (NORMODYNE,TRANDATE) injection 20 mg  20 mg Intravenous Once Jay K. Posey Pronto, MD   20 mg at 11/15/11 1933  . lidocaine (XYLOCAINE) 1 % injection 5 mL  5 mL Intradermal PRN Ulice Dash K. Posey Pronto, MD      . lidocaine-prilocaine (EMLA) cream 1 application  1 application Topical PRN Ulice Dash K. Posey Pronto, MD      . morphine 4 MG/ML injection 6 mg  6 mg Intravenous Once [employer]	[place], PA-C   6  mg at 11/15/11 1556  . morphine 4 MG/ML injection 6 mg  6 mg Intravenous Once Lisette Paz, PA-C   6 mg at 11/15/11 1801  . ondansetron (ZOFRAN) 4 MG/2ML injection           . pentafluoroprop-tetrafluoroeth (GEBAUERS) aerosol 1 application  1 application Topical PRN Jay K. Posey Pronto, MD      . promethazine [facility]) injection 25 mg  25 mg Intravenous To ER Lisette Paz, PA-C   25 mg at 11/15/11 1627  . sodium chloride 0.9 % bolus 500 mL  500 mL Intravenous Once Lisette Paz, PA-C   500 mL at 11/15/11 1801  . sodium chloride 0.9 % bolus 500 mL  500 mL Intravenous Once Lisette Paz, PA-C   500 mL at 11/15/11 1926  . DISCONTD: sodium chloride 0.9 % bolus 1,000 mL  1,000 mL Intravenous Once Verl Dicker, PA-C       Current Outpatient Prescriptions  Medication Sig Dispense Refill  . diltiazem (CARDIZEM CD) 240 MG 24 hr capsule Take 240 mg by mouth daily.      . furosemide (LASIX) 40 MG tablet Take 40 mg by  mouth 2 (two) times daily.      . hydrALAZINE (APRESOLINE) 50 MG tablet Take 50 mg by mouth 2 (two) times daily.      . insulin lispro protamine-insulin lispro (HUMALOG 75/25) (75-25) 100 UNIT/ML SUSP Inject 8-16 Units into the skin 2 (two) times daily with a meal. 8 units with breakfast, and 16 units with evening meal.      . Insulin Pen Needle 31G X 8 MM MISC 1 each by Does not apply route 2 (two) times daily.  60 each  11  . levothyroxine (SYNTHROID, LEVOTHROID) 50 MCG tablet Take 50 mcg by mouth daily.        ROS: A full review of systems is obtained and is negative except as noted in the HPI.   Physical Exam: Blood pressure 164/99, pulse 102, temperature 98.7 F (37.1 C), temperature source Oral, resp. rate 17, last menstrual period 11/03/2011, SpO2 95.00%. Pulses paradox was 5-7 mmHg GENERAL: no acute distress.  EYES: Extra ocular movements are intact. There is no lid lag. Sclera is anicteric.  ENT: Oropharynx is clear. Dentition is within normal limits.  NECK: Supple. The thyroid is not enlarged.  LYMPH: There are no masses or lymphadenopathy present.  HEART: tachy, no murmur but S4 is present.  JVP is elevated LUNGS: bilateral decreased BS at bases with dullness to percussion consistent with pleural effusions ABDOMEN: mild midepigastric TTP  EXTREMITIES: 2+ pitting edema bilaterally PULSES: y. DP/PT pulses were +2 and equal bilaterally.  SKIN: Warm, dry, and intact.  NEUROLOGIC: The patient was oriented to person, place, and time. No overt neurologic deficits were detected.  PSYCH: Normal judgment and insight, mood is appropriate.    Results: Results for orders placed during the hospital encounter of 11/15/11 (from the past 24 hour(s))  LIPASE, BLOOD     Status: Normal   Collection Time   11/15/11  3:52 PM      Component Value Range   Lipase 22  11 - 59 (U/L)  COMPREHENSIVE METABOLIC PANEL     Status: Abnormal   Collection Time   11/15/11  3:52 PM      Component Value Range    Sodium 135  135 - 145 (mEq/L)   Potassium 5.1  3.5 - 5.1 (mEq/L)   Chloride 105  96 - 112 (mEq/L)   CO2 17 (*)  19 - 32 (mEq/L)   Glucose, Bld 272 (*) 70 - 99 (mg/dL)   BUN 53 (*) 6 - 23 (mg/dL)   Creatinine, Ser 6.63 (*) 0.50 - 1.10 (mg/dL)   Calcium 8.5  8.4 - 10.5 (mg/dL)   Total Protein 6.0  6.0 - 8.3 (g/dL)   Albumin 2.8 (*) 3.5 - 5.2 (g/dL)   AST 9  0 - 37 (U/L)   ALT 10  0 - 35 (U/L)   Alkaline Phosphatase 74  39 - 117 (U/L)   Total Bilirubin 0.2 (*) 0.3 - 1.2 (mg/dL)   GFR calc non Af Amer 8 (*) >90 (mL/min)   GFR calc Af Amer 9 (*) >90 (mL/min)  CBC     Status: Abnormal   Collection Time   11/15/11  3:52 PM      Component Value Range   WBC 7.5  4.0 - 10.5 (K/uL)   RBC 3.36 (*) 3.87 - 5.11 (MIL/uL)   Hemoglobin 10.2 (*) 12.0 - 15.0 (g/dL)   HCT 29.9 (*) 36.0 - 46.0 (%)   MCV 89.0  78.0 - 100.0 (fL)   MCH 30.4  26.0 - 34.0 (pg)   MCHC 34.1  30.0 - 36.0 (g/dL)   RDW 14.2  11.5 - 15.5 (%)   Platelets 152  150 - 400 (K/uL)  DIFFERENTIAL     Status: Abnormal   Collection Time   11/15/11  3:52 PM      Component Value Range   Neutrophils Relative 87 (*) 43 - 77 (%)   Neutro Abs 6.5  1.7 - 7.7 (K/uL)   Lymphocytes Relative 10 (*) 12 - 46 (%)   Lymphs Abs 0.8  0.7 - 4.0 (K/uL)   Monocytes Relative 2 (*) 3 - 12 (%)   Monocytes Absolute 0.2  0.1 - 1.0 (K/uL)   Eosinophils Relative 0  0 - 5 (%)   Eosinophils Absolute 0.0  0.0 - 0.7 (K/uL)   Basophils Relative 0  0 - 1 (%)   Basophils Absolute 0.0  0.0 - 0.1 (K/uL)  GLUCOSE, CAPILLARY     Status: Abnormal   Collection Time   11/15/11  4:45 PM      Component Value Range   Glucose-Capillary 263 (*) 70 - 99 (mg/dL)  POCT PREGNANCY, URINE     Status: Normal   Collection Time   11/15/11  5:52 PM      Component Value Range   Preg Test, Ur NEGATIVE  NEGATIVE   URINALYSIS, ROUTINE W REFLEX MICROSCOPIC     Status: Abnormal   Collection Time   11/15/11  5:53 PM      Component Value Range   Color, Urine YELLOW  YELLOW     APPearance CLOUDY (*) CLEAR    Specific Gravity, Urine 1.017  1.005 - 1.030    pH 6.0  5.0 - 8.0    Glucose, UA 500 (*) NEGATIVE (mg/dL)   Hgb urine dipstick MODERATE (*) NEGATIVE    Bilirubin Urine NEGATIVE  NEGATIVE    Ketones, ur NEGATIVE  NEGATIVE (mg/dL)   Protein, ur >300 (*) NEGATIVE (mg/dL)   Urobilinogen, UA 0.2  0.0 - 1.0 (mg/dL)   Nitrite NEGATIVE  NEGATIVE    Leukocytes, UA NEGATIVE  NEGATIVE   URINE MICROSCOPIC-ADD ON     Status: Abnormal   Collection Time   11/15/11  5:53 PM      Component Value Range   Squamous Epithelial / LPF MANY (*) RARE    WBC, UA  0-2  <3 (WBC/hpf)   RBC / HPF 7-10  <3 (RBC/hpf)   Bacteria, UA FEW (*) RARE    Casts GRANULAR CAST (*) NEGATIVE     CXR: pending EKG: Sinus tachy without STT changes  Assessment/Plan: 31 yo female with DM 1 and CKD here with volume overload secondary to progression of her CKD as well as abdominal pain.  She was noted incidentally on an abdominal u/s to have a pericardial effusion.  By my review of the images it is a least moderate in size.  Clinically, she has no evidence of tamponade.  She is hypertensive and has no significant pulsus paradox. 1. Pericardial effusion/Pleural effusion: secondary to volume overload/ESRD.  Expect to improve with HD.  No evidence of tamponade - order echo in AM to evaluate - could consider attempting further IV diuresis with lasix and metolazone tonight - HD in AM per Dr. Posey Pronto 2. Abdominal Pain: gastroparesis vs gastritis or hepatic/bowel congestion?  - workup per [name] 3. ESRD:  - Dr. Posey Pronto following and plans HD in AM  [facility] 11/15/2011, 9:38 PM

## 2011-11-15 NOTE — ED Notes (Signed)
Abdominal pain with N/V denies diarrhea since 10am.  10/10 pain scale.  Pt in renal failure and due to have shunt put in this week.  Iv 20 left ac   4 zofran, sinus tach on monitor.  CBG 254, 193/112

## 2011-11-15 NOTE — ED Notes (Signed)
Pt states she has had N/V since morning.  Pt unsure if she had fever but has had chills.  Renal pt.  Pain starts in upper center of abdomin and radiates to lower.  Pain 10/10

## 2011-11-15 NOTE — ED Notes (Signed)
Admitting MD at bedside.

## 2011-11-16 ENCOUNTER — Inpatient Hospital Stay (HOSPITAL_COMMUNITY): Payer: Medicaid Other

## 2011-11-16 ENCOUNTER — Encounter (HOSPITAL_COMMUNITY): Payer: Self-pay | Admitting: Anesthesiology

## 2011-11-16 ENCOUNTER — Encounter (HOSPITAL_COMMUNITY): Payer: Self-pay | Admitting: *Deleted

## 2011-11-16 DIAGNOSIS — J90 Pleural effusion, not elsewhere classified: Secondary | ICD-10-CM | POA: Diagnosis present

## 2011-11-16 DIAGNOSIS — I319 Disease of pericardium, unspecified: Secondary | ICD-10-CM

## 2011-11-16 DIAGNOSIS — D631 Anemia in chronic kidney disease: Secondary | ICD-10-CM | POA: Diagnosis present

## 2011-11-16 LAB — IRON AND TIBC
Iron: 34 ug/dL — ABNORMAL LOW (ref 42–135)
TIBC: 209 ug/dL — ABNORMAL LOW (ref 250–470)
UIBC: 175 ug/dL (ref 125–400)

## 2011-11-16 LAB — GLUCOSE, CAPILLARY
Glucose-Capillary: 146 mg/dL — ABNORMAL HIGH (ref 70–99)
Glucose-Capillary: 92 mg/dL (ref 70–99)
Glucose-Capillary: 70 mg/dL (ref 70–99)
Glucose-Capillary: 71 mg/dL (ref 70–99)
Glucose-Capillary: 84 mg/dL (ref 70–99)

## 2011-11-16 LAB — COMPREHENSIVE METABOLIC PANEL
AST: 7 U/L (ref 0–37)
Albumin: 2.3 g/dL — ABNORMAL LOW (ref 3.5–5.2)
Alkaline Phosphatase: 60 U/L (ref 39–117)
CO2: 20 mEq/L (ref 19–32)
Chloride: 109 mEq/L (ref 96–112)
GFR calc non Af Amer: 7 mL/min — ABNORMAL LOW (ref >90)
Potassium: 4.3 mEq/L (ref 3.5–5.1)
Total Bilirubin: 0.2 mg/dL — ABNORMAL LOW (ref 0.3–1.2)

## 2011-11-16 LAB — CBC
HCT: 26.5 % — ABNORMAL LOW (ref 36.0–46.0)
Hemoglobin: 8.9 g/dL — ABNORMAL LOW (ref 12.0–15.0)
MCV: 89.5 fL (ref 78.0–100.0)
RDW: 14.7 % (ref 11.5–15.5)
WBC: 6.4 10*3/uL (ref 4.0–10.5)

## 2011-11-16 LAB — MAGNESIUM: Magnesium: 2.3 mg/dL (ref 1.5–2.5)

## 2011-11-16 LAB — HEMOGLOBIN A1C: Mean Plasma Glucose: 137 mg/dL — ABNORMAL HIGH (ref <117)

## 2011-11-16 LAB — SURGICAL PCR SCREEN: Staphylococcus aureus: NEGATIVE

## 2011-11-16 LAB — FERRITIN: Ferritin: 226 ng/mL (ref 10–291)

## 2011-11-16 IMAGING — XA IR US GUIDE VASC ACCESS RIGHT
1 series · 3 of 3 positions shown · non-contrast
Comparison: none

CLINICAL DATA: Acute renal failure requiring hemodialysis.

[Series 1: run · 3 of 3 slices shown]
[im 1/3]
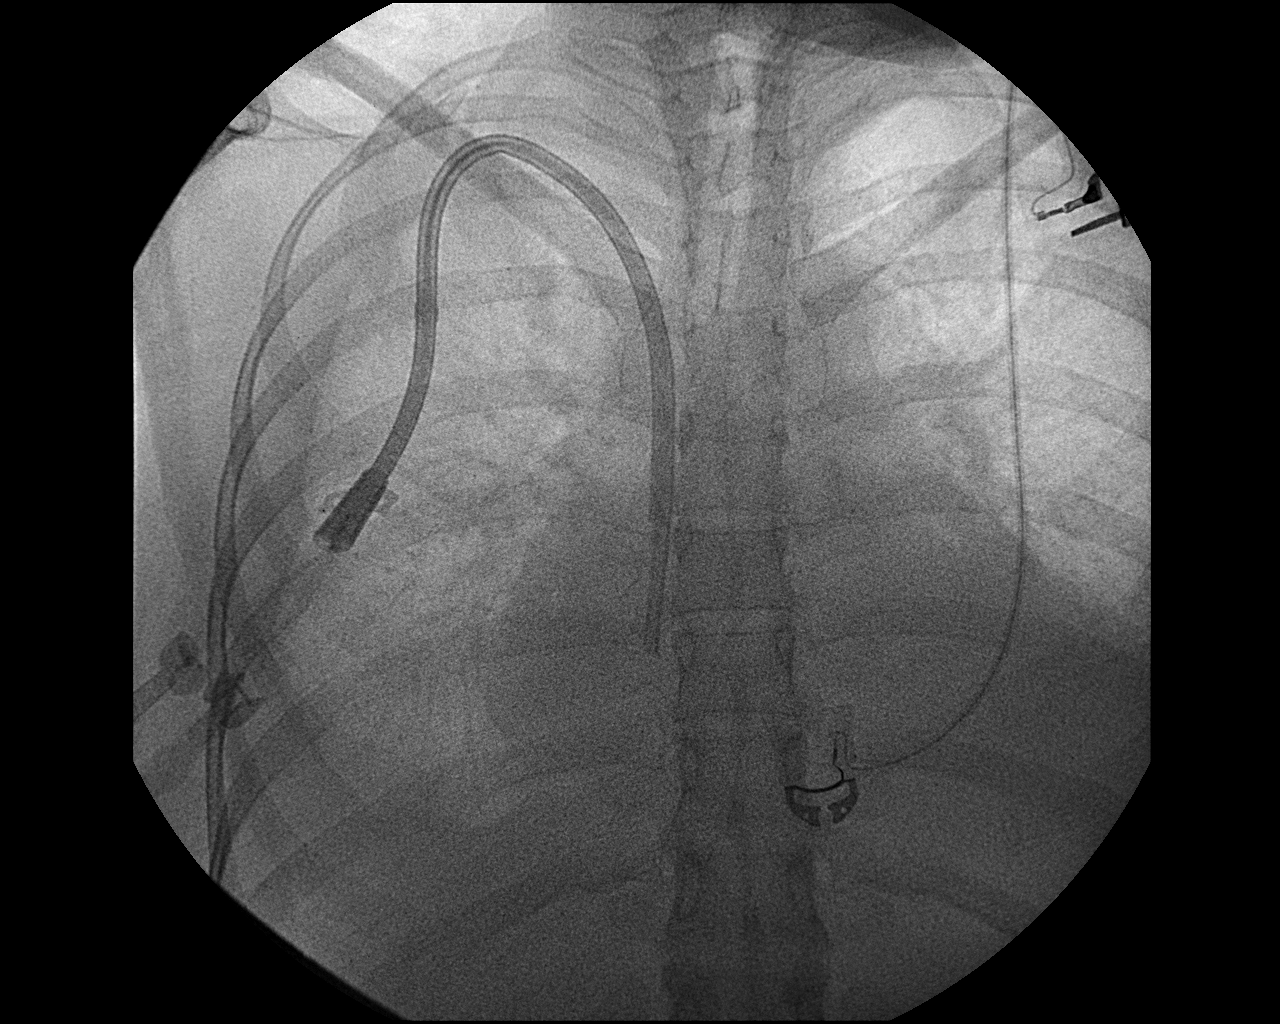
[im 2/3]
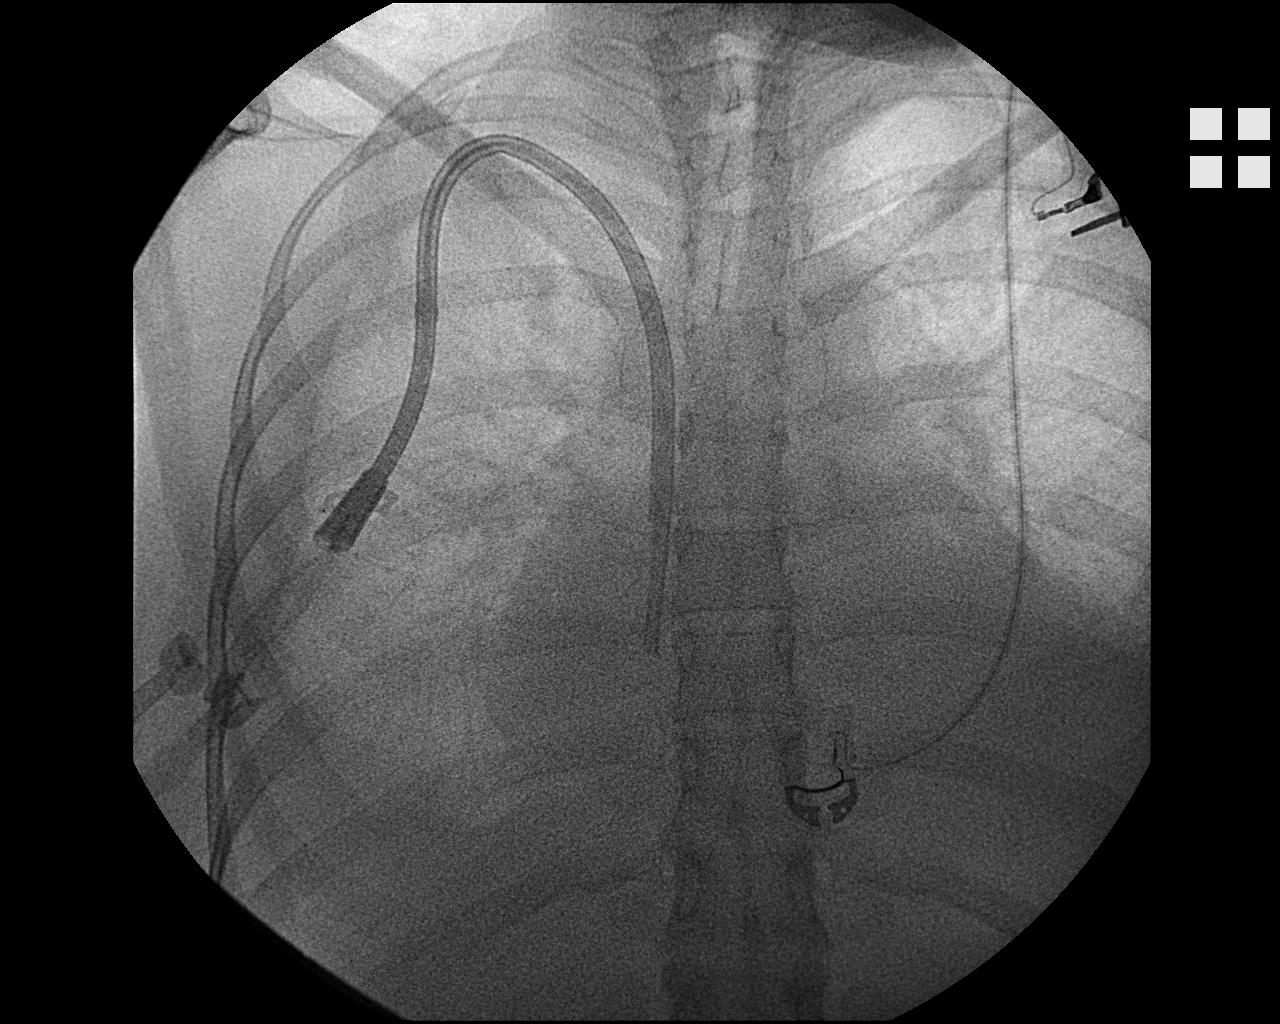
[im 3/3]
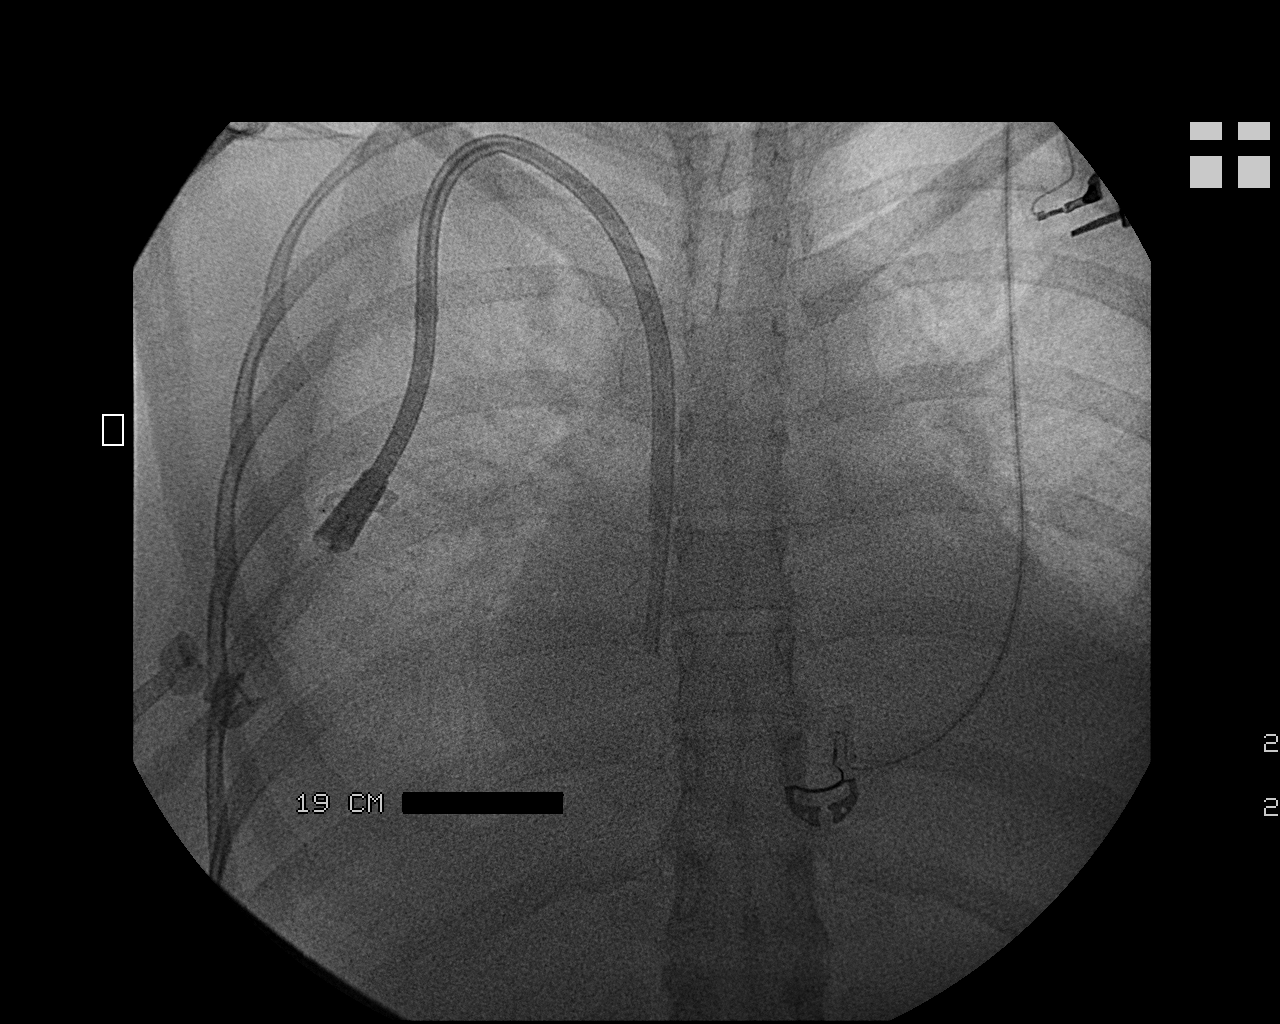

[3 of 3 positions shown; findings below may reference images not displayed]

TUNNELED CENTRAL VENOUS HEMODIALYSIS CATHETER PLACEMENT WITH
ULTRASOUND AND FLUOROSCOPIC GUIDANCE

Sedation:  1.5 mg IV Versed; 75 mcg IV Fentanyl.

Total Moderate Sedation Time:  30 minutes.

Additional Medications:  1 gram IV Ancef.  As antibiotic
prophylaxis, Ancef 1 gm was ordered pre-procedure and administered
intravenously within one hour of incision.

Fluoroscopy Time:  0.7 minutes.

Procedure:  The procedure, risks, benefits, and alternatives were
explained to the patient.  Questions regarding the procedure were
encouraged and answered.  The patient understands and consents to
the procedure.

The right neck and chest were prepped with chlorhexidine in a
sterile fashion, and a sterile drape was applied covering the
operative field.  Maximum barrier sterile technique with sterile
gowns and gloves were used for the procedure.  Local anesthesia was
provided with 1% lidocaine.

After creating a small venotomy incision, a 21 gauge needle was
advanced into the right internal jugular vein under direct, real-
time ultrasound guidance.  Ultrasound image documentation was
performed.  After securing guidewire access, an 8 Fr dilator was
placed.  A J-wire was kinked to measure appropriate catheter
length.

GUJUNGU HemoSplit tunneled hemodialysis catheter measuring 19 cm
from tip to cuff was chosen for placement.  This was tunneled in a
retrograde fashion from the chest wall to the venotomy incision.

At the venotomy, serial dilatation was performed and a 16 Fr peel-
away sheath was placed over a guidewire.  The catheter was then
placed through the sheath and the sheath removed.  Final catheter
positioning was confirmed and documented with a fluoroscopic spot
image.  The catheter was aspirated, flushed with saline, and
injected with appropriate volume heparin dwells.

The venotomy incision was closed with subcutaneous 3-0 Monocryl and
subcuticular 4-0 Vicryl.  Dermabond was applied to the incision.
The catheter exit site was secured with 0-Prolene retention
sutures.

Complications: None.  No pneumothorax.
FINDINGS: After catheter placement, the tips lie in the right
atrium.  The catheter aspirates normally and is ready for immediate
use.
IMPRESSION: Placement of tunneled hemodialysis catheter via the right internal
jugular vein.  The catheter tips lie in the right atrium.  The
catheter is ready for immediate use.

## 2011-11-16 IMAGING — US IR US GUIDE VASC ACCESS RIGHT
1 series · 1 of 1 positions shown · non-contrast
Comparison: none

CLINICAL DATA: Acute renal failure requiring hemodialysis.

[Series 1: ir us guide vasc access right · 1 of 1 slices shown]
[im 1/1]
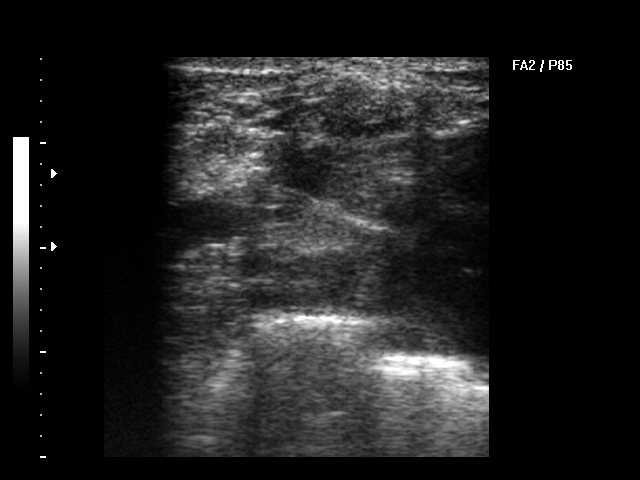

[1 of 1 positions shown; findings below may reference images not displayed]

TUNNELED CENTRAL VENOUS HEMODIALYSIS CATHETER PLACEMENT WITH
ULTRASOUND AND FLUOROSCOPIC GUIDANCE

Sedation:  1.5 mg IV Versed; 75 mcg IV Fentanyl.

Total Moderate Sedation Time:  30 minutes.

Additional Medications:  1 gram IV Ancef.  As antibiotic
prophylaxis, Ancef 1 gm was ordered pre-procedure and administered
intravenously within one hour of incision.

Fluoroscopy Time:  0.7 minutes.

Procedure:  The procedure, risks, benefits, and alternatives were
explained to the patient.  Questions regarding the procedure were
encouraged and answered.  The patient understands and consents to
the procedure.

The right neck and chest were prepped with chlorhexidine in a
sterile fashion, and a sterile drape was applied covering the
operative field.  Maximum barrier sterile technique with sterile
gowns and gloves were used for the procedure.  Local anesthesia was
provided with 1% lidocaine.

After creating a small venotomy incision, a 21 gauge needle was
advanced into the right internal jugular vein under direct, real-
time ultrasound guidance.  Ultrasound image documentation was
performed.  After securing guidewire access, an 8 Fr dilator was
placed.  A J-wire was kinked to measure appropriate catheter
length.

GUJUNGU HemoSplit tunneled hemodialysis catheter measuring 19 cm
from tip to cuff was chosen for placement.  This was tunneled in a
retrograde fashion from the chest wall to the venotomy incision.

At the venotomy, serial dilatation was performed and a 16 Fr peel-
away sheath was placed over a guidewire.  The catheter was then
placed through the sheath and the sheath removed.  Final catheter
positioning was confirmed and documented with a fluoroscopic spot
image.  The catheter was aspirated, flushed with saline, and
injected with appropriate volume heparin dwells.

The venotomy incision was closed with subcutaneous 3-0 Monocryl and
subcuticular 4-0 Vicryl.  Dermabond was applied to the incision.
The catheter exit site was secured with 0-Prolene retention
sutures.

Complications: None.  No pneumothorax.
FINDINGS: After catheter placement, the tips lie in the right
atrium.  The catheter aspirates normally and is ready for immediate
use.
IMPRESSION: Placement of tunneled hemodialysis catheter via the right internal
jugular vein.  The catheter tips lie in the right atrium.  The
catheter is ready for immediate use.

## 2011-11-16 MED ORDER — SODIUM CHLORIDE 0.9 % IJ SOLN
3.0000 mL | Freq: Two times a day (BID) | INTRAMUSCULAR | Status: DC
  Administered 2011-11-16 – 2011-11-27 (×17): 3 mL via INTRAVENOUS

## 2011-11-16 MED ORDER — SODIUM CHLORIDE 0.9 % IJ SOLN
3.0000 mL | Freq: Two times a day (BID) | INTRAMUSCULAR | Status: DC
  Administered 2011-11-17 – 2011-11-25 (×6): 3 mL via INTRAVENOUS

## 2011-11-16 MED ORDER — SODIUM CHLORIDE 0.9 % IJ SOLN
3.0000 mL | INTRAMUSCULAR | Status: DC | PRN
  Administered 2011-11-22 – 2011-11-25 (×2): 3 mL via INTRAVENOUS

## 2011-11-16 MED ORDER — DEXTROSE 5 % IV SOLN
INTRAVENOUS | Status: DC
  Administered 2011-11-16: 10 mL via INTRAVENOUS

## 2011-11-16 MED ORDER — ONDANSETRON HCL 4 MG PO TABS: 4.0000 mg | ORAL_TABLET | Freq: Four times a day (QID) | ORAL | Status: DC | PRN

## 2011-11-16 MED ORDER — DEXTROSE 50 % IV SOLN: 50.0000 mL | Freq: Once | INTRAVENOUS | Status: AC | PRN

## 2011-11-16 MED ORDER — LEVOTHYROXINE SODIUM 50 MCG PO TABS
50.0000 ug | ORAL_TABLET | Freq: Every day | ORAL | Status: DC
  Administered 2011-11-16 – 2011-11-27 (×11): 50 ug via ORAL
  Filled 2011-11-16 (×15): qty 1

## 2011-11-16 MED ORDER — MIDAZOLAM HCL 2 MG/2ML IJ SOLN
INTRAMUSCULAR | Status: AC
  Filled 2011-11-16: qty 4

## 2011-11-16 MED ORDER — FUROSEMIDE 10 MG/ML IJ SOLN
160.0000 mg | Freq: Two times a day (BID) | INTRAVENOUS | Status: DC
  Administered 2011-11-16 – 2011-11-17 (×3): 160 mg via INTRAVENOUS
  Filled 2011-11-16 (×4): qty 16

## 2011-11-16 MED ORDER — SODIUM CHLORIDE 0.9 % IV SOLN: 250.0000 mL | INTRAVENOUS | Status: DC | PRN

## 2011-11-16 MED ORDER — SODIUM CHLORIDE 0.9 % IV SOLN
125.0000 mg | INTRAVENOUS | Status: DC
  Administered 2011-11-18 – 2011-11-25 (×4): 125 mg via INTRAVENOUS
  Filled 2011-11-16 (×7): qty 10

## 2011-11-16 MED ORDER — FUROSEMIDE 10 MG/ML IJ SOLN
80.0000 mg | Freq: Two times a day (BID) | INTRAMUSCULAR | Status: DC
  Filled 2011-11-16 (×2): qty 8

## 2011-11-16 MED ORDER — SODIUM CHLORIDE 0.9 % IV SOLN
INTRAVENOUS | Status: AC | PRN
  Administered 2011-11-16: 50 mL/h via INTRAVENOUS

## 2011-11-16 MED ORDER — FUROSEMIDE 10 MG/ML IJ SOLN
80.0000 mg | Freq: Two times a day (BID) | INTRAMUSCULAR | Status: DC
  Filled 2011-11-16 (×3): qty 8

## 2011-11-16 MED ORDER — GLUCOSE 40 % PO GEL: 1.0000 | ORAL | Status: DC | PRN

## 2011-11-16 MED ORDER — MIDAZOLAM HCL 5 MG/5ML IJ SOLN
INTRAMUSCULAR | Status: AC | PRN
  Administered 2011-11-16: 1 mg via INTRAVENOUS
  Administered 2011-11-16: 0.5 mg via INTRAVENOUS

## 2011-11-16 MED ORDER — DARBEPOETIN ALFA-POLYSORBATE 100 MCG/0.5ML IJ SOLN
100.0000 ug | INTRAMUSCULAR | Status: DC
  Administered 2011-11-17 – 2011-11-23 (×2): 100 ug via INTRAVENOUS
  Filled 2011-11-16 (×2): qty 0.5

## 2011-11-16 MED ORDER — FENTANYL CITRATE 0.05 MG/ML IJ SOLN
INTRAMUSCULAR | Status: AC
  Filled 2011-11-16: qty 4

## 2011-11-16 MED ORDER — GUAIFENESIN-DM 100-10 MG/5ML PO SYRP
5.0000 mL | ORAL_SOLUTION | ORAL | Status: DC | PRN
  Filled 2011-11-16: qty 5

## 2011-11-16 MED ORDER — SODIUM CHLORIDE 0.9 % IV SOLN
INTRAVENOUS | Status: DC
  Administered 2011-11-16: 05:00:00 via INTRAVENOUS

## 2011-11-16 MED ORDER — ACETAMINOPHEN 325 MG PO TABS: 650.0000 mg | ORAL_TABLET | Freq: Four times a day (QID) | ORAL | Status: DC | PRN

## 2011-11-16 MED ORDER — DEXTROSE 50 % IV SOLN: 25.0000 mL | Freq: Once | INTRAVENOUS | Status: AC | PRN

## 2011-11-16 MED ORDER — FENTANYL CITRATE 0.05 MG/ML IJ SOLN
INTRAMUSCULAR | Status: AC | PRN
  Administered 2011-11-16: 25 ug via INTRAVENOUS
  Administered 2011-11-16: 50 ug via INTRAVENOUS

## 2011-11-16 MED ORDER — PANTOPRAZOLE SODIUM 40 MG IV SOLR
40.0000 mg | Freq: Two times a day (BID) | INTRAVENOUS | Status: DC
  Administered 2011-11-16 – 2011-11-17 (×4): 40 mg via INTRAVENOUS
  Filled 2011-11-16 (×6): qty 40

## 2011-11-16 MED ORDER — MORPHINE SULFATE 2 MG/ML IJ SOLN
2.0000 mg | INTRAMUSCULAR | Status: DC | PRN
  Administered 2011-11-16 – 2011-11-26 (×27): 2 mg via INTRAVENOUS
  Filled 2011-11-16 (×26): qty 1

## 2011-11-16 MED ORDER — METOPROLOL TARTRATE 1 MG/ML IV SOLN
5.0000 mg | INTRAVENOUS | Status: DC | PRN
  Administered 2011-11-16: 5 mg via INTRAVENOUS
  Filled 2011-11-16: qty 5

## 2011-11-16 MED ORDER — HYDRALAZINE HCL 50 MG PO TABS
50.0000 mg | ORAL_TABLET | Freq: Two times a day (BID) | ORAL | Status: DC
  Administered 2011-11-16 – 2011-11-27 (×21): 50 mg via ORAL
  Filled 2011-11-16 (×25): qty 1

## 2011-11-16 MED ORDER — ONDANSETRON HCL 4 MG/2ML IJ SOLN
4.0000 mg | Freq: Four times a day (QID) | INTRAMUSCULAR | Status: DC | PRN
  Administered 2011-11-16 – 2011-11-17 (×3): 4 mg via INTRAVENOUS
  Filled 2011-11-16 (×3): qty 2

## 2011-11-16 MED ORDER — RENA-VITE PO TABS
1.0000 | ORAL_TABLET | Freq: Every day | ORAL | Status: DC
  Administered 2011-11-16 – 2011-11-26 (×11): 1 via ORAL
  Filled 2011-11-16 (×12): qty 1

## 2011-11-16 MED ORDER — METOPROLOL TARTRATE 1 MG/ML IV SOLN
INTRAVENOUS | Status: AC
  Filled 2011-11-16: qty 5

## 2011-11-16 MED ORDER — DILTIAZEM HCL ER COATED BEADS 240 MG PO CP24
240.0000 mg | ORAL_CAPSULE | Freq: Every day | ORAL | Status: DC
  Administered 2011-11-16 – 2011-11-27 (×12): 240 mg via ORAL
  Filled 2011-11-16 (×12): qty 1

## 2011-11-16 MED ORDER — CEFAZOLIN SODIUM 1-5 GM-% IV SOLN
1.0000 g | Freq: Once | INTRAVENOUS | Status: AC
  Administered 2011-11-16: 1 g via INTRAVENOUS
  Filled 2011-11-16: qty 50

## 2011-11-16 MED ORDER — CEFAZOLIN SODIUM 1-5 GM-% IV SOLN
INTRAVENOUS | Status: AC
  Administered 2011-11-16: 15:00:00
  Filled 2011-11-16: qty 50

## 2011-11-16 MED ORDER — ALBUTEROL SULFATE (5 MG/ML) 0.5% IN NEBU: 2.5000 mg | INHALATION_SOLUTION | RESPIRATORY_TRACT | Status: DC | PRN

## 2011-11-16 MED ORDER — HYDROCODONE-ACETAMINOPHEN 5-325 MG PO TABS
1.0000 | ORAL_TABLET | ORAL | Status: DC | PRN
  Administered 2011-11-16: 1 via ORAL
  Administered 2011-11-16: 2 via ORAL
  Administered 2011-11-16: 1 via ORAL
  Administered 2011-11-17 – 2011-11-26 (×18): 2 via ORAL
  Filled 2011-11-16 (×2): qty 2
  Filled 2011-11-16: qty 1
  Filled 2011-11-16: qty 2
  Filled 2011-11-16: qty 1
  Filled 2011-11-16 (×13): qty 2

## 2011-11-16 MED ORDER — FUROSEMIDE 10 MG/ML IJ SOLN
80.0000 mg | Freq: Once | INTRAMUSCULAR | Status: AC
  Administered 2011-11-16: 80 mg via INTRAVENOUS
  Filled 2011-11-16: qty 8

## 2011-11-16 MED ORDER — CEFAZOLIN SODIUM 1-5 GM-% IV SOLN: 1.0000 g | INTRAVENOUS | Status: DC

## 2011-11-16 MED ORDER — ACETAMINOPHEN 650 MG RE SUPP: 650.0000 mg | Freq: Four times a day (QID) | RECTAL | Status: DC | PRN

## 2011-11-16 MED ORDER — CEFAZOLIN SODIUM 1-5 GM-% IV SOLN
1.0000 g | INTRAVENOUS | Status: AC
  Administered 2011-11-17: 1 g via INTRAVENOUS
  Filled 2011-11-16: qty 50

## 2011-11-16 NOTE — ED Provider Notes (Signed)
Medical screening examination/treatment/procedure(s) were conducted as a shared visit with non-physician practitioner(s) and myself.  I personally evaluated the patient during the encounter   [name] Octave, MD 11/16/11 [phone]

## 2011-11-16 NOTE — Progress Notes (Signed)
S:abd pain gone.  Poor appetite. + fatigue O:BP 159/91  Pulse 103  Temp(Src) 98.2 F (36.8 C) (Oral)  Resp 18  Ht 5\' 2"  (1.575 m)  Wt 64.2 kg (141 lb 8.6 oz)  BMI 25.89 kg/m2  SpO2 95%  LMP 11/03/2011  Intake/Output Summary (Last 24 hours) at 11/16/11 1051 Last data filed at 11/16/11 0800  Gross per 24 hour  Intake     20 ml  Output    550 ml  Net   -530 ml   Weight change:  [id number] and alert CVS:RRR no rub Resp:decreased BS in bases Abd:+ bs NTND Ext:1+ EDEMA NEURO:CNI Ox3  No asterixis      . calcium carbonate  1 tablet Oral TID WC  .  ceFAZolin (ANCEF) IV  1 g Intravenous Once  . darbepoetin (ARANESP) injection - NON-DIALYSIS  100 mcg Subcutaneous Q Fri-1800  . diltiazem  240 mg Oral Daily  . furosemide  160 mg Intravenous BID  . furosemide  80 mg Intravenous Once  . furosemide  80 mg Intravenous Once  . hydrALAZINE  10 mg Intravenous To Major  . hydrALAZINE  50 mg Oral BID  . insulin aspart  0-9 Units Subcutaneous Q4H  . insulin glargine  12 Units Subcutaneous QHS  . labetalol  20 mg Intravenous Once  . levothyroxine  50 mcg Oral Q breakfast  .  morphine injection  6 mg Intravenous Once  .  morphine injection  6 mg Intravenous Once  . ondansetron      . pantoprazole (PROTONIX) IV  40 mg Intravenous Q12H  . promethazine  25 mg Intravenous To ER  . sodium chloride  500 mL Intravenous Once  . sodium chloride  500 mL Intravenous Once  . sodium chloride  3 mL Intravenous Q12H  . sodium chloride  3 mL Intravenous Q12H  . DISCONTD: furosemide  80 mg Intravenous BID  . DISCONTD: furosemide  80 mg Intravenous BID  . DISCONTD: sodium chloride  1,000 mL Intravenous Once   US Abdomen Complete  11/15/2011  *RADIOLOGY REPORT*  Clinical Data:  Epigastric pain. Rule out biliary disease. Chronic renal failure. Type 1 diabetes.  ABDOMINAL ULTRASOUND COMPLETE  Comparison:  CT abdomen pelvis 02/20/2010  Findings:  Inferior chest:  A significant pericardial effusion is  partially visualized.  Prominent bilateral pleural effusions are noted.  Gallbladder:  The gallbladder wall thickness measures slightly prominent at 3.5 mm.  No gallstones or sludge is seen.  No definite sonographic Murphy's sign is seen.  The sonographer reports the patient has generalized abdominal pain with palpation.  Common Bile Duct:  Within normal limits in caliber. Measures 2.9 mm.  Liver: No focal mass lesion identified.  Within normal limits in parenchymal echogenicity.  IVC:  Appears normal.  Pancreas:  Although the pancreas is difficult to visualize in its entirety, no focal pancreatic abnormality is identified.  Spleen:  Measures greater than 13.9 cm in length (cannot be completely imaged on one ultrasound screen).  Calculated splenic volume is estimated to be 600 cm cubed.  No focal splenic lesion.  Right kidney:  Normal in size.  Measures 12.1 cm.  Increased echogenicity of the right kidney. No evidence of mass or hydronephrosis.  Left kidney:  Normal in size.  Measures 12.2 cm.  Increased echogenicity of the left kidney.  No evidence of mass or hydronephrosis.  Abdominal Aorta:  No aneurysm identified.  No ascites is identified on these images.  IMPRESSION:  1.  Significant pericardial effusion  noted. 2.  Large bilateral pleural effusions. 3.  Mild diffuse gallbladder wall thickening.  Negative for stones or sludge.  Findings are nonspecific, and can be related to other entitities besides cholecystitis, such as hypoproteinemia, liver disease, pancreatitis, congestive heart failure, ascites, etc.eight calculus acute cholecystitis cannot be completely excluded; nuclear medicine imaging could be performed, if clinically indicated. 4.  Splenomegaly. 5.  Increased echogenicity of both kidneys consistent with history of chronic renal failure.  Original Report Authenticated By: Curlene Dolphin, M.D.   BMET    Component Value Date/Time   NA 138 11/16/2011 0455   K 4.3 11/16/2011 0455   CL 109 11/16/2011  0455   CO2 20 11/16/2011 0455   GLUCOSE 109* 11/16/2011 0455   BUN 55* 11/16/2011 0455   CREATININE 6.84* 11/16/2011 0455   CALCIUM 8.4 11/16/2011 0455   GFRNONAA 7* 11/16/2011 0455   GFRAA 9* 11/16/2011 0455   CBC    Component Value Date/Time   WBC 6.4 11/16/2011 0455   RBC 2.96* 11/16/2011 0455   HGB 8.9* 11/16/2011 0455   HCT 26.5* 11/16/2011 0455   PLT 158 11/16/2011 0455   MCV 89.5 11/16/2011 0455   MCH 30.1 11/16/2011 0455   MCHC 33.6 11/16/2011 0455   RDW 14.7 11/16/2011 0455   LYMPHSABS 0.8 11/15/2011 1552   MONOABS 0.2 11/15/2011 1552   EOSABS 0.0 11/15/2011 1552   BASOSABS 0.0 11/15/2011 1552     Assessment:  1. ESRD sec DM with uremic sxs 2. Anemia 3. ABD pain, resolved  prob uremic in prigin 4. Pleural/pericardial effusions, uremic in origin   Plan: 1. For perm cath and HD today 2. HD again in am 3. IV iron 4 start aranesp 5. Check PTH 6. Will perm access this hospitalization 7. Start clip process for outpt HD   Gottlieb Zuercher T

## 2011-11-16 NOTE — Interval H&P Note (Cosign Needed)
History and Physical Interval Note:  11/16/2011 11:44 AM  [name] [name]  Is scheduled for hemodialysis catheter placement today.  The various methods of treatment have been discussed with the patient . After consideration of risks, benefits and other options for treatment, the patient has consented to  The above procedure. .  The patients' history has been reviewed, patient examined, no change in status, stable for the above procedure.  I have reviewed the patients' chart and labs.  Questions were answered to the patient's satisfaction.   Past Medical History  Diagnosis Date  . DIABETES MELLITUS, TYPE I, UNCONTROLLED 11/05/2007  . MEDIAL EPICONDYLITIS, LEFT 12/17/2007  . Vision loss   . Renal disorder   . Diabetes mellitus   . Hypertension    Past Surgical History  Procedure Date  . Refractive surgery   . Eye surgery    Dg Chest 2 View  11/10/2011  *RADIOLOGY REPORT*  Clinical Data: Weakness, shortness of breath, headache, nausea, vomiting, hypertension, diabetes  CHEST - 2 VIEW  Comparison: 10/30/2011  Findings: Enlargement of cardiac silhouette. Mediastinal contours and pulmonary vascularity normal. Bibasilar effusions and atelectasis increased since previous exam. Unable to exclude underlying infiltrates in lower lobes. Upper lungs clear. No pneumothorax. Bones unremarkable.  IMPRESSION: Enlargement of cardiac silhouette. Increased bibasilar effusions and atelectasis.  Original Report Authenticated By: Burnetta Sabin, M.D.   Dg Chest 2 View  10/30/2011  *RADIOLOGY REPORT*  Clinical Data: Cough and shortness of breath.  CHEST - 2 VIEW  Comparison: Chest x-ray 04/22/2010.  Findings: The heart is mildly enlarged.  The mediastinal and hilar contours are within normal limits.  There are bilateral pleural effusions and overlying atelectasis.  No edema or pneumothorax.  IMPRESSION: Bilateral pleural effusions with overlying atelectasis.  Original Report Authenticated By: P. Kalman Jewels, M.D.   Ct  Head Wo Contrast  11/10/2011  *RADIOLOGY REPORT*  Clinical Data: Left side headache for 6-7 hours, nausea, vomiting, blurred vision  CT HEAD WITHOUT CONTRAST  Technique:  Contiguous axial images were obtained from the base of the skull through the vertex without contrast.  Comparison: None  Findings: Normal ventricular morphology. No midline shift or mass effect. Normal appearance of brain parenchyma. No intracranial hemorrhage, mass lesion, or acute infarction. Visualized paranasal sinuses and mastoid air cells clear. Bones unremarkable.  IMPRESSION: No acute intracranial abnormalities.  Original Report Authenticated By: Burnetta Sabin, M.D.   US Abdomen Complete  11/15/2011  *RADIOLOGY REPORT*  Clinical Data:  Epigastric pain. Rule out biliary disease. Chronic renal failure. Type 1 diabetes.  ABDOMINAL ULTRASOUND COMPLETE  Comparison:  CT abdomen pelvis 02/20/2010  Findings:  Inferior chest:  A significant pericardial effusion is partially visualized.  Prominent bilateral pleural effusions are noted.  Gallbladder:  The gallbladder wall thickness measures slightly prominent at 3.5 mm.  No gallstones or sludge is seen.  No definite sonographic Murphy's sign is seen.  The sonographer reports the patient has generalized abdominal pain with palpation.  Common Bile Duct:  Within normal limits in caliber. Measures 2.9 mm.  Liver: No focal mass lesion identified.  Within normal limits in parenchymal echogenicity.  IVC:  Appears normal.  Pancreas:  Although the pancreas is difficult to visualize in its entirety, no focal pancreatic abnormality is identified.  Spleen:  Measures greater than 13.9 cm in length (cannot be completely imaged on one ultrasound screen).  Calculated splenic volume is estimated to be 600 cm cubed.  No focal splenic lesion.  Right kidney:  Normal in size.  Measures 12.1 cm.  Increased echogenicity of the right kidney. No evidence of mass or hydronephrosis.  Left kidney:  Normal in size.  Measures  12.2 cm.  Increased echogenicity of the left kidney.  No evidence of mass or hydronephrosis.  Abdominal Aorta:  No aneurysm identified.  No ascites is identified on these images.  IMPRESSION:  1.  Significant pericardial effusion noted. 2.  Large bilateral pleural effusions. 3.  Mild diffuse gallbladder wall thickening.  Negative for stones or sludge.  Findings are nonspecific, and can be related to other entitities besides cholecystitis, such as hypoproteinemia, liver disease, pancreatitis, congestive heart failure, ascites, etc.eight calculus acute cholecystitis cannot be completely excluded; nuclear medicine imaging could be performed, if clinically indicated. 4.  Splenomegaly. 5.  Increased echogenicity of both kidneys consistent with history of chronic renal failure.  Original Report Authenticated By: Curlene Dolphin, M.D.  Results for orders placed during the hospital encounter of 11/15/11  LIPASE, BLOOD      Component Value Range   Lipase 22  11 - 59 (U/L)  COMPREHENSIVE METABOLIC PANEL      Component Value Range   Sodium 135  135 - 145 (mEq/L)   Potassium 5.1  3.5 - 5.1 (mEq/L)   Chloride 105  96 - 112 (mEq/L)   CO2 17 (*) 19 - 32 (mEq/L)   Glucose, Bld 272 (*) 70 - 99 (mg/dL)   BUN 53 (*) 6 - 23 (mg/dL)   Creatinine, Ser 6.63 (*) 0.50 - 1.10 (mg/dL)   Calcium 8.5  8.4 - 10.5 (mg/dL)   Total Protein 6.0  6.0 - 8.3 (g/dL)   Albumin 2.8 (*) 3.5 - 5.2 (g/dL)   AST 9  0 - 37 (U/L)   ALT 10  0 - 35 (U/L)   Alkaline Phosphatase 74  39 - 117 (U/L)   Total Bilirubin 0.2 (*) 0.3 - 1.2 (mg/dL)   GFR calc non Af Amer 8 (*) >90 (mL/min)   GFR calc Af Amer 9 (*) >90 (mL/min)  CBC      Component Value Range   WBC 7.5  4.0 - 10.5 (K/uL)   RBC 3.36 (*) 3.87 - 5.11 (MIL/uL)   Hemoglobin 10.2 (*) 12.0 - 15.0 (g/dL)   HCT 29.9 (*) 36.0 - 46.0 (%)   MCV 89.0  78.0 - 100.0 (fL)   MCH 30.4  26.0 - 34.0 (pg)   MCHC 34.1  30.0 - 36.0 (g/dL)   RDW 14.2  11.5 - 15.5 (%)   Platelets 152  150 - 400 (K/uL)    DIFFERENTIAL      Component Value Range   Neutrophils Relative 87 (*) 43 - 77 (%)   Neutro Abs 6.5  1.7 - 7.7 (K/uL)   Lymphocytes Relative 10 (*) 12 - 46 (%)   Lymphs Abs 0.8  0.7 - 4.0 (K/uL)   Monocytes Relative 2 (*) 3 - 12 (%)   Monocytes Absolute 0.2  0.1 - 1.0 (K/uL)   Eosinophils Relative 0  0 - 5 (%)   Eosinophils Absolute 0.0  0.0 - 0.7 (K/uL)   Basophils Relative 0  0 - 1 (%)   Basophils Absolute 0.0  0.0 - 0.1 (K/uL)  URINALYSIS, ROUTINE W REFLEX MICROSCOPIC      Component Value Range   Color, Urine YELLOW  YELLOW    APPearance CLOUDY (*) CLEAR    Specific Gravity, Urine 1.017  1.005 - 1.030    pH 6.0  5.0 - 8.0    Glucose,  UA 500 (*) NEGATIVE (mg/dL)   Hgb urine dipstick MODERATE (*) NEGATIVE    Bilirubin Urine NEGATIVE  NEGATIVE    Ketones, ur NEGATIVE  NEGATIVE (mg/dL)   Protein, ur >300 (*) NEGATIVE (mg/dL)   Urobilinogen, UA 0.2  0.0 - 1.0 (mg/dL)   Nitrite NEGATIVE  NEGATIVE    Leukocytes, UA NEGATIVE  NEGATIVE   GLUCOSE, CAPILLARY      Component Value Range   Glucose-Capillary 263 (*) 70 - 99 (mg/dL)  POCT PREGNANCY, URINE      Component Value Range   Preg Test, Ur NEGATIVE  NEGATIVE   URINE MICROSCOPIC-ADD ON      Component Value Range   Squamous Epithelial / LPF MANY (*) RARE    WBC, UA 0-2  <3 (WBC/hpf)   RBC / HPF 7-10  <3 (RBC/hpf)   Bacteria, UA FEW (*) RARE    Casts GRANULAR CAST (*) NEGATIVE   CBC      Component Value Range   WBC 6.4  4.0 - 10.5 (K/uL)   RBC 2.96 (*) 3.87 - 5.11 (MIL/uL)   Hemoglobin 8.9 (*) 12.0 - 15.0 (g/dL)   HCT 26.5 (*) 36.0 - 46.0 (%)   MCV 89.5  78.0 - 100.0 (fL)   MCH 30.1  26.0 - 34.0 (pg)   MCHC 33.6  30.0 - 36.0 (g/dL)   RDW 14.7  11.5 - 15.5 (%)   Platelets 158  150 - 400 (K/uL)  PROTIME-INR      Component Value Range   Prothrombin Time 16.0 (*) 11.6 - 15.2 (seconds)   INR 1.25  0.00 - 1.49   IRON AND TIBC      Component Value Range   Iron 34 (*) 42 - 135 (ug/dL)   TIBC 209 (*) 250 - 470 (ug/dL)    Saturation Ratios 16 (*) 20 - 55 (%)   UIBC 175  125 - 400 (ug/dL)  FERRITIN      Component Value Range   Ferritin 226  10 - 291 (ng/mL)  GLUCOSE, CAPILLARY      Component Value Range   Glucose-Capillary 187 (*) 70 - 99 (mg/dL)  GLUCOSE, CAPILLARY      Component Value Range   Glucose-Capillary 146 (*) 70 - 99 (mg/dL)  COMPREHENSIVE METABOLIC PANEL      Component Value Range   Sodium 138  135 - 145 (mEq/L)   Potassium 4.3  3.5 - 5.1 (mEq/L)   Chloride 109  96 - 112 (mEq/L)   CO2 20  19 - 32 (mEq/L)   Glucose, Bld 109 (*) 70 - 99 (mg/dL)   BUN 55 (*) 6 - 23 (mg/dL)   Creatinine, Ser 6.84 (*) 0.50 - 1.10 (mg/dL)   Calcium 8.4  8.4 - 10.5 (mg/dL)   Total Protein 5.3 (*) 6.0 - 8.3 (g/dL)   Albumin 2.3 (*) 3.5 - 5.2 (g/dL)   AST 7  0 - 37 (U/L)   ALT 8  0 - 35 (U/L)   Alkaline Phosphatase 60  39 - 117 (U/L)   Total Bilirubin 0.2 (*) 0.3 - 1.2 (mg/dL)   GFR calc non Af Amer 7 (*) >90 (mL/min)   GFR calc Af Amer 9 (*) >90 (mL/min)  MAGNESIUM      Component Value Range   Magnesium 2.3  1.5 - 2.5 (mg/dL)  SURGICAL PCR SCREEN      Component Value Range   MRSA, PCR NEGATIVE  NEGATIVE    Staphylococcus aureus NEGATIVE  NEGATIVE  GLUCOSE, CAPILLARY      Component Value Range   Glucose-Capillary 111 (*) 70 - 99 (mg/dL)   Comment 1 Documented in Chart     Comment 2 Notify RN    GLUCOSE, CAPILLARY      Component Value Range   Glucose-Capillary 92  70 - 99 (mg/dL)   Comment 1 Notify RN    GLUCOSE, CAPILLARY      Component Value Range   Glucose-Capillary 70  70 - 99 (mg/dL)     Merlyn Bollen,D Lennette Bihari

## 2011-11-16 NOTE — Progress Notes (Signed)
  Echocardiogram 2D Echocardiogram has been performed.  [name] [name] 11/16/2011, 10:43 AM

## 2011-11-16 NOTE — H&P (View-Only) (Signed)
S:abd pain gone.  Poor appetite. + fatigue O:BP 159/91  Pulse 103  Temp(Src) 98.2 F (36.8 C) (Oral)  Resp 18  Ht 5\' 2"  (1.575 m)  Wt 64.2 kg (141 lb 8.6 oz)  BMI 25.89 kg/m2  SpO2 95%  LMP 11/03/2011  Intake/Output Summary (Last 24 hours) at 11/16/11 1051 Last data filed at 11/16/11 0800  Gross per 24 hour  Intake     20 ml  Output    550 ml  Net   -530 ml   Weight change:  [id number] and alert CVS:RRR no rub Resp:decreased BS in bases Abd:+ bs NTND Ext:1+ EDEMA NEURO:CNI Ox3  No asterixis      . calcium carbonate  1 tablet Oral TID WC  .  ceFAZolin (ANCEF) IV  1 g Intravenous Once  . darbepoetin (ARANESP) injection - NON-DIALYSIS  100 mcg Subcutaneous Q Fri-1800  . diltiazem  240 mg Oral Daily  . furosemide  160 mg Intravenous BID  . furosemide  80 mg Intravenous Once  . furosemide  80 mg Intravenous Once  . hydrALAZINE  10 mg Intravenous To Major  . hydrALAZINE  50 mg Oral BID  . insulin aspart  0-9 Units Subcutaneous Q4H  . insulin glargine  12 Units Subcutaneous QHS  . labetalol  20 mg Intravenous Once  . levothyroxine  50 mcg Oral Q breakfast  .  morphine injection  6 mg Intravenous Once  .  morphine injection  6 mg Intravenous Once  . ondansetron      . pantoprazole (PROTONIX) IV  40 mg Intravenous Q12H  . promethazine  25 mg Intravenous To ER  . sodium chloride  500 mL Intravenous Once  . sodium chloride  500 mL Intravenous Once  . sodium chloride  3 mL Intravenous Q12H  . sodium chloride  3 mL Intravenous Q12H  . DISCONTD: furosemide  80 mg Intravenous BID  . DISCONTD: furosemide  80 mg Intravenous BID  . DISCONTD: sodium chloride  1,000 mL Intravenous Once   US Abdomen Complete  11/15/2011  *RADIOLOGY REPORT*  Clinical Data:  Epigastric pain. Rule out biliary disease. Chronic renal failure. Type 1 diabetes.  ABDOMINAL ULTRASOUND COMPLETE  Comparison:  CT abdomen pelvis 02/20/2010  Findings:  Inferior chest:  A significant pericardial effusion is  partially visualized.  Prominent bilateral pleural effusions are noted.  Gallbladder:  The gallbladder wall thickness measures slightly prominent at 3.5 mm.  No gallstones or sludge is seen.  No definite sonographic Murphy's sign is seen.  The sonographer reports the patient has generalized abdominal pain with palpation.  Common Bile Duct:  Within normal limits in caliber. Measures 2.9 mm.  Liver: No focal mass lesion identified.  Within normal limits in parenchymal echogenicity.  IVC:  Appears normal.  Pancreas:  Although the pancreas is difficult to visualize in its entirety, no focal pancreatic abnormality is identified.  Spleen:  Measures greater than 13.9 cm in length (cannot be completely imaged on one ultrasound screen).  Calculated splenic volume is estimated to be 600 cm cubed.  No focal splenic lesion.  Right kidney:  Normal in size.  Measures 12.1 cm.  Increased echogenicity of the right kidney. No evidence of mass or hydronephrosis.  Left kidney:  Normal in size.  Measures 12.2 cm.  Increased echogenicity of the left kidney.  No evidence of mass or hydronephrosis.  Abdominal Aorta:  No aneurysm identified.  No ascites is identified on these images.  IMPRESSION:  1.  Significant pericardial effusion  noted. 2.  Large bilateral pleural effusions. 3.  Mild diffuse gallbladder wall thickening.  Negative for stones or sludge.  Findings are nonspecific, and can be related to other entitities besides cholecystitis, such as hypoproteinemia, liver disease, pancreatitis, congestive heart failure, ascites, etc.eight calculus acute cholecystitis cannot be completely excluded; nuclear medicine imaging could be performed, if clinically indicated. 4.  Splenomegaly. 5.  Increased echogenicity of both kidneys consistent with history of chronic renal failure.  Original Report Authenticated By: Curlene Dolphin, M.D.   BMET    Component Value Date/Time   NA 138 11/16/2011 0455   K 4.3 11/16/2011 0455   CL 109 11/16/2011  0455   CO2 20 11/16/2011 0455   GLUCOSE 109* 11/16/2011 0455   BUN 55* 11/16/2011 0455   CREATININE 6.84* 11/16/2011 0455   CALCIUM 8.4 11/16/2011 0455   GFRNONAA 7* 11/16/2011 0455   GFRAA 9* 11/16/2011 0455   CBC    Component Value Date/Time   WBC 6.4 11/16/2011 0455   RBC 2.96* 11/16/2011 0455   HGB 8.9* 11/16/2011 0455   HCT 26.5* 11/16/2011 0455   PLT 158 11/16/2011 0455   MCV 89.5 11/16/2011 0455   MCH 30.1 11/16/2011 0455   MCHC 33.6 11/16/2011 0455   RDW 14.7 11/16/2011 0455   LYMPHSABS 0.8 11/15/2011 1552   MONOABS 0.2 11/15/2011 1552   EOSABS 0.0 11/15/2011 1552   BASOSABS 0.0 11/15/2011 1552     Assessment:  1. ESRD sec DM with uremic sxs 2. Anemia 3. ABD pain, resolved  prob uremic in prigin 4. Pleural/pericardial effusions, uremic in origin   Plan: 1. For perm cath and HD today 2. HD again in am 3. IV iron 4 start aranesp 5. Check PTH 6. Will perm access this hospitalization 7. Start clip process for outpt HD   [name] [name]

## 2011-11-16 NOTE — Progress Notes (Signed)
SUBJECTIVE:  Nausea and vomitting is somewhat better.  She is not having pain but does have a slight cough   PHYSICAL EXAM Filed Vitals:   11/16/11 0500 11/16/11 0600 11/16/11 0700 11/16/11 0745  BP: 170/94 158/84 165/88 159/91  Pulse: 102 102 103 103  Temp:    98.2 F (36.8 C)  TempSrc:    Oral  Resp:    18  Height:      Weight:      SpO2: 94% 94% 94% 95%   General:  No distress Neck:  No JVD Lungs:  Decreased BS slightly on the left base Heart:  RRR, no rub Abdomen:  Positive bowel sounds, no rebound no guarding Extremities:  Trace edema.  LABS: No results found for this basename: CKTOTAL, CKMB, CKMBINDEX, TROPONINI   Results for orders placed during the hospital encounter of 11/15/11 (from the past 24 hour(s))  LIPASE, BLOOD     Status: Normal   Collection Time   11/15/11  3:52 PM      Component Value Range   Lipase 22  11 - 59 (U/L)  COMPREHENSIVE METABOLIC PANEL     Status: Abnormal   Collection Time   11/15/11  3:52 PM      Component Value Range   Sodium 135  135 - 145 (mEq/L)   Potassium 5.1  3.5 - 5.1 (mEq/L)   Chloride 105  96 - 112 (mEq/L)   CO2 17 (*) 19 - 32 (mEq/L)   Glucose, Bld 272 (*) 70 - 99 (mg/dL)   BUN 53 (*) 6 - 23 (mg/dL)   Creatinine, Ser 6.63 (*) 0.50 - 1.10 (mg/dL)   Calcium 8.5  8.4 - 10.5 (mg/dL)   Total Protein 6.0  6.0 - 8.3 (g/dL)   Albumin 2.8 (*) 3.5 - 5.2 (g/dL)   AST 9  0 - 37 (U/L)   ALT 10  0 - 35 (U/L)   Alkaline Phosphatase 74  39 - 117 (U/L)   Total Bilirubin 0.2 (*) 0.3 - 1.2 (mg/dL)   GFR calc non Af Amer 8 (*) >90 (mL/min)   GFR calc Af Amer 9 (*) >90 (mL/min)  CBC     Status: Abnormal   Collection Time   11/15/11  3:52 PM      Component Value Range   WBC 7.5  4.0 - 10.5 (K/uL)   RBC 3.36 (*) 3.87 - 5.11 (MIL/uL)   Hemoglobin 10.2 (*) 12.0 - 15.0 (g/dL)   HCT 29.9 (*) 36.0 - 46.0 (%)   MCV 89.0  78.0 - 100.0 (fL)   MCH 30.4  26.0 - 34.0 (pg)   MCHC 34.1  30.0 - 36.0 (g/dL)   RDW 14.2  11.5 - 15.5 (%)   Platelets 152  150 - 400 (K/uL)  DIFFERENTIAL     Status: Abnormal   Collection Time   11/15/11  3:52 PM      Component Value Range   Neutrophils Relative 87 (*) 43 - 77 (%)   Neutro Abs 6.5  1.7 - 7.7 (K/uL)   Lymphocytes Relative 10 (*) 12 - 46 (%)   Lymphs Abs 0.8  0.7 - 4.0 (K/uL)   Monocytes Relative 2 (*) 3 - 12 (%)   Monocytes Absolute 0.2  0.1 - 1.0 (K/uL)   Eosinophils Relative 0  0 - 5 (%)   Eosinophils Absolute 0.0  0.0 - 0.7 (K/uL)   Basophils Relative 0  0 - 1 (%)   Basophils Absolute 0.0  0.0 -  0.1 (K/uL)  GLUCOSE, CAPILLARY     Status: Abnormal   Collection Time   11/15/11  4:45 PM      Component Value Range   Glucose-Capillary 263 (*) 70 - 99 (mg/dL)  POCT PREGNANCY, URINE     Status: Normal   Collection Time   11/15/11  5:52 PM      Component Value Range   Preg Test, Ur NEGATIVE  NEGATIVE   URINALYSIS, ROUTINE W REFLEX MICROSCOPIC     Status: Abnormal   Collection Time   11/15/11  5:53 PM      Component Value Range   Color, Urine YELLOW  YELLOW    APPearance CLOUDY (*) CLEAR    Specific Gravity, Urine 1.017  1.005 - 1.030    pH 6.0  5.0 - 8.0    Glucose, UA 500 (*) NEGATIVE (mg/dL)   Hgb urine dipstick MODERATE (*) NEGATIVE    Bilirubin Urine NEGATIVE  NEGATIVE    Ketones, ur NEGATIVE  NEGATIVE (mg/dL)   Protein, ur >300 (*) NEGATIVE (mg/dL)   Urobilinogen, UA 0.2  0.0 - 1.0 (mg/dL)   Nitrite NEGATIVE  NEGATIVE    Leukocytes, UA NEGATIVE  NEGATIVE   URINE MICROSCOPIC-ADD ON     Status: Abnormal   Collection Time   11/15/11  5:53 PM      Component Value Range   Squamous Epithelial / LPF MANY (*) RARE    WBC, UA 0-2  <3 (WBC/hpf)   RBC / HPF 7-10  <3 (RBC/hpf)   Bacteria, UA FEW (*) RARE    Casts GRANULAR CAST (*) NEGATIVE   IRON AND TIBC     Status: Abnormal   Collection Time   11/15/11  8:04 PM      Component Value Range   Iron 34 (*) 42 - 135 (ug/dL)   TIBC 209 (*) 250 - 470 (ug/dL)   Saturation Ratios 16 (*) 20 - 55 (%)   UIBC 175  125 - 400  (ug/dL)  FERRITIN     Status: Normal   Collection Time   11/15/11  8:04 PM      Component Value Range   Ferritin 226  10 - 291 (ng/mL)  GLUCOSE, CAPILLARY     Status: Abnormal   Collection Time   11/15/11 10:29 PM      Component Value Range   Glucose-Capillary 187 (*) 70 - 99 (mg/dL)  GLUCOSE, CAPILLARY     Status: Abnormal   Collection Time   11/16/11  1:02 AM      Component Value Range   Glucose-Capillary 146 (*) 70 - 99 (mg/dL)  GLUCOSE, CAPILLARY     Status: Abnormal   Collection Time   11/16/11  4:47 AM      Component Value Range   Glucose-Capillary 111 (*) 70 - 99 (mg/dL)   Comment 1 Documented in Chart     Comment 2 Notify RN    SURGICAL PCR SCREEN     Status: Normal   Collection Time   11/16/11  4:54 AM      Component Value Range   MRSA, PCR NEGATIVE  NEGATIVE    Staphylococcus aureus NEGATIVE  NEGATIVE   CBC     Status: Abnormal   Collection Time   11/16/11  4:55 AM      Component Value Range   WBC 6.4  4.0 - 10.5 (K/uL)   RBC 2.96 (*) 3.87 - 5.11 (MIL/uL)   Hemoglobin 8.9 (*) 12.0 - 15.0 (g/dL)  HCT 26.5 (*) 36.0 - 46.0 (%)   MCV 89.5  78.0 - 100.0 (fL)   MCH 30.1  26.0 - 34.0 (pg)   MCHC 33.6  30.0 - 36.0 (g/dL)   RDW 14.7  11.5 - 15.5 (%)   Platelets 158  150 - 400 (K/uL)  PROTIME-INR     Status: Abnormal   Collection Time   11/16/11  4:55 AM      Component Value Range   Prothrombin Time 16.0 (*) 11.6 - 15.2 (seconds)   INR 1.25  0.00 - 1.49   COMPREHENSIVE METABOLIC PANEL     Status: Abnormal   Collection Time   11/16/11  4:55 AM      Component Value Range   Sodium 138  135 - 145 (mEq/L)   Potassium 4.3  3.5 - 5.1 (mEq/L)   Chloride 109  96 - 112 (mEq/L)   CO2 20  19 - 32 (mEq/L)   Glucose, Bld 109 (*) 70 - 99 (mg/dL)   BUN 55 (*) 6 - 23 (mg/dL)   Creatinine, Ser 6.84 (*) 0.50 - 1.10 (mg/dL)   Calcium 8.4  8.4 - 10.5 (mg/dL)   Total Protein 5.3 (*) 6.0 - 8.3 (g/dL)   Albumin 2.3 (*) 3.5 - 5.2 (g/dL)   AST 7  0 - 37 (U/L)   ALT 8  0 - 35 (U/L)    Alkaline Phosphatase 60  39 - 117 (U/L)   Total Bilirubin 0.2 (*) 0.3 - 1.2 (mg/dL)   GFR calc non Af Amer 7 (*) >90 (mL/min)   GFR calc Af Amer 9 (*) >90 (mL/min)  MAGNESIUM     Status: Normal   Collection Time   11/16/11  4:55 AM      Component Value Range   Magnesium 2.3  1.5 - 2.5 (mg/dL)    Intake/Output Summary (Last 24 hours) at 11/16/11 0756 Last data filed at 11/16/11 0323  Gross per 24 hour  Intake      0 ml  Output    550 ml  Net   -550 ml    EKG:  11/16/2011  Rate 104 sinus with nonspecific T wave flattening  ASSESSMENT AND PLAN:   1. Pericardial effusion/Pleural effusion: secondary to volume overload/ESRD. Expect to improve with HD. No evidence of tamponade (the patient is hypertensive).  Echo has been ordered and is pending.    2. Abdominal Pain: gastroparesis vs gastritis or hepatic/bowel congestion? Workup per [name]   3. ESRD:  Dr. Posey Pronto following and plans HD in AM.    Minus Breeding 11/16/2011 7:56 AM

## 2011-11-16 NOTE — Procedures (Signed)
Procedure:  Tunneled dialysis catheter placement Access:  Right IJ vein 19 cm tip to cuff Bard Hemosplit cath Tip:  RA.  No PTX.  OK to use

## 2011-11-16 NOTE — Progress Notes (Addendum)
[name]   Subjective: Awake, endorses feeling better and denies SOB or chest pain.  Objective: Vital signs in last 24 hours: Temp:  [98.1 F (36.7 C)-98.7 F (37.1 C)] 98.2 F (36.8 C) (03/14 0745) Pulse Rate:  [100-121] 103  (03/14 0745) Resp:  [16-24] 18  (03/14 0745) BP: (156-215)/(84-117) 159/91 mmHg (03/14 0745) SpO2:  [94 %-98 %] 95 % (03/14 0745) Weight:  [64.2 kg (141 lb 8.6 oz)] 64.2 kg (141 lb 8.6 oz) (03/14 0347) Weight change:  Last BM Date: 11/15/11 (Per Patient)  Intake/Output from previous day: 03/13 0701 - 03/14 0700 In: 10 [I.V.:10] Out: 550 [Urine:550] Intake/Output this shift: Total I/O In: 10 [I.V.:10] Out: -   General appearance: alert, appears older than stated age, cachectic, mild distress, pale and syndromic appearance - uremic Resp: Coarse butclear to auscultation bilaterally. Also marked decrease sounds bibasilar Cardio: regular rate and rhythm, S1, S2 normal, no murmur, click, rub or gallop GI: soft, non-tender; bowel sounds normal; no masses,  no organomegaly Extremities: extremities normal, atraumatic, no cyanosis or edema Neurologic: Grossly normal  Lab Results:  Basename 11/16/11 0455 11/15/11 1552  WBC 6.4 7.5  HGB 8.9* 10.2*  HCT 26.5* 29.9*  PLT 158 152   BMET  Basename 11/16/11 0455 11/15/11 1552  NA 138 135  K 4.3 5.1  CL 109 105  CO2 20 17*  GLUCOSE 109* 272*  BUN 55* 53*  CREATININE 6.84* 6.63*  CALCIUM 8.4 8.5    Studies/Results: US Abdomen Complete  11/15/2011  *RADIOLOGY REPORT*  Clinical Data:  Epigastric pain. Rule out biliary disease. Chronic renal failure. Type 1 diabetes.  ABDOMINAL ULTRASOUND COMPLETE  Comparison:  CT abdomen pelvis 02/20/2010  Findings:  Inferior chest:  A significant pericardial effusion is partially visualized.  Prominent bilateral pleural effusions are noted.  Gallbladder:  The gallbladder wall thickness measures slightly prominent at 3.5 mm.  No gallstones or sludge is seen.   No definite sonographic Murphy's sign is seen.  The sonographer reports the patient has generalized abdominal pain with palpation.  Common Bile Duct:  Within normal limits in caliber. Measures 2.9 mm.  Liver: No focal mass lesion identified.  Within normal limits in parenchymal echogenicity.  IVC:  Appears normal.  Pancreas:  Although the pancreas is difficult to visualize in its entirety, no focal pancreatic abnormality is identified.  Spleen:  Measures greater than 13.9 cm in length (cannot be completely imaged on one ultrasound screen).  Calculated splenic volume is estimated to be 600 cm cubed.  No focal splenic lesion.  Right kidney:  Normal in size.  Measures 12.1 cm.  Increased echogenicity of the right kidney. No evidence of mass or hydronephrosis.  Left kidney:  Normal in size.  Measures 12.2 cm.  Increased echogenicity of the left kidney.  No evidence of mass or hydronephrosis.  Abdominal Aorta:  No aneurysm identified.  No ascites is identified on these images.  IMPRESSION:  1.  Significant pericardial effusion noted. 2.  Large bilateral pleural effusions. 3.  Mild diffuse gallbladder wall thickening.  Negative for stones or sludge.  Findings are nonspecific, and can be related to other entitities besides cholecystitis, such as hypoproteinemia, liver disease, pancreatitis, congestive heart failure, ascites, etc.eight calculus acute cholecystitis cannot be completely excluded; nuclear medicine imaging could be performed, if clinically indicated. 4.  Splenomegaly. 5.  Increased echogenicity of both kidneys consistent with history of chronic renal failure.  Original Report Authenticated By: Curlene Dolphin, M.D.    Medications:  I have  reviewed the patient's current medications. Scheduled:   . calcium carbonate  1 tablet Oral TID WC  .  ceFAZolin (ANCEF) IV  1 g Intravenous Once  . darbepoetin (ARANESP) injection - NON-DIALYSIS  100 mcg Subcutaneous Q Fri-1800  . diltiazem  240 mg Oral Daily  .  furosemide  160 mg Intravenous BID  . furosemide  80 mg Intravenous Once  . furosemide  80 mg Intravenous Once  . hydrALAZINE  10 mg Intravenous To Major  . hydrALAZINE  50 mg Oral BID  . insulin aspart  0-9 Units Subcutaneous Q4H  . insulin glargine  12 Units Subcutaneous QHS  . labetalol  20 mg Intravenous Once  . levothyroxine  50 mcg Oral Q breakfast  .  morphine injection  6 mg Intravenous Once  .  morphine injection  6 mg Intravenous Once  . ondansetron      . pantoprazole (PROTONIX) IV  40 mg Intravenous Q12H  . promethazine  25 mg Intravenous To ER  . sodium chloride  500 mL Intravenous Once  . sodium chloride  500 mL Intravenous Once  . sodium chloride  3 mL Intravenous Q12H  . sodium chloride  3 mL Intravenous Q12H  . DISCONTD: furosemide  80 mg Intravenous BID  . DISCONTD: furosemide  80 mg Intravenous BID  . DISCONTD: sodium chloride  1,000 mL Intravenous Once    Assessment/Plan:  Principal Problem:  CKD (chronic kidney disease) stage 5, GFR less than 15 ml/min * Appreciate renal assistance- plan is to insert temporary HD cath and begin dialysis today * In the interim will increase Lasix to 160 mg IV BID *Follow renal panel post HD * plan is for placement of permanent HD access this admission  Active Problems:  Pericardial effusion/Fluid overload/Pleural effusion, bilateral * Due to progressive renal failure and anticipate will resolve after HD initiated * Cardiology following- patient is hemodynamically stable and without signs of tamponade physiology on exam -ECHO completed shows moderate to large pericardial effusion with moderate RA collapse and mild RV collapse- defer to cardiology whether pericardiocentesis indicated. * ECHO also shows preserved LV function and grade 1 diastolic dysfunction * Symptom management   DIABETES MELLITUS, TYPE I, UNCONTROLLED *has been diabetic for 5 years *current CBG well controlled * last HgbA1c in system was 10.8 in  2011 *continue Lantus and SSI  Hypoglycemia * Relative with CBG down to 70. She is NPO for HD cath palcement so will change KVO fluids to dextrose and add hypoglycemia protocol   Hypertension * moderate control * expect improvement once HD started   *Anemia in chronic kidney disease (CKD) * management per renal * started on Aranesp and IV iron   Abdominal pain, epigastric * resolved and likely r/t uremia   Disposition * remain in stepdown    LOS: 1 day   Erin Hearing, ANP pager [phone]  [name] [email or web address] Password: [email or web address]  11/16/2011, 10:49 AM  I have examined the patient, reviewed the chart and discussed the plan with Erin Hearing, NP. I agree with the above note.   Debbe Odea, MD [phone]  Clarification: She has been diabetic for 13 years and not 5 years as previously documented.  Erin Hearing, ANP

## 2011-11-16 NOTE — Progress Notes (Signed)
[facility] pre-op note  Pt. Seen by Dr. Bridgett Larsson 11/03/11 for placement HD ACCESS IN LEFT Arm see note below Based on vein mapping and examination, this patient's permanent access options include: low possibility of R BC AVF, but most likely a AVG will be needed.  Orders written for OR 11/17/11 Pt. Aware  IR to place IDC today so that Pt. May be dialyzed today OK to proceed with AVF/AVGG per Dr. Mercy Moore.  Rawlings J 11/16/2011 11:48 AM       [facility] Referred by:  Mack Hook, MD  [place]  [address]  [place], [place] [id number]  Reason for referral: New access  History of Present Illness  [name] [name] is a 31 y.o. ([birth year]) female who presents for evaluation for permanent access. The patient is right hand dominant. The patient has not had previous access procedures. Previous central venous cannulation procedures include: none. The patient has never had a PPM placed. Source of her chronic kidney disease failure is insulin dependent diabetes mellitus.  Past Medical History   Diagnosis  Date   .  DIABETES MELLITUS, TYPE I, UNCONTROLLED  11/05/2007   .  MEDIAL EPICONDYLITIS, LEFT  12/17/2007   .  Vision loss    .  Renal disorder    .  Diabetes mellitus    .  Hypertension     Past Surgical History   Procedure  Date   .  Refractive surgery    .  Eye surgery     History    Social History   .  Marital Status:  Single     Spouse Name:  N/A     Number of Children:  N/A   .  Years of Education:  N/A    Occupational History   .  Not on file.    Social History Main Topics   .  Smoking status:  Former Smoker -- 1 years     Types:  Cigarettes   .  Smokeless tobacco:  Never Used   .  Alcohol Use:  No   .  Drug Use:  No   .  Sexually Active:  Yes     Birth Control/ Protection:  Pill    Other Topics  Concern   .  Not on file    Social History Narrative    Worked for [employer]--as did husband. Had to leave [place] for safety  reasons. Has been in [place] since 9/09.    Family History   Problem  Relation  Age of Onset   .  Hypertension  Father     Current Outpatient Prescriptions on File Prior to Visit   Medication  Sig  Dispense  Refill   .  furosemide (LASIX) 40 MG tablet  Take 2 tablets (80 mg total) by mouth daily.  30 tablet  0   .  insulin lispro protamine-insulin lispro (HUMALOG 75/25) (75-25) 100 UNIT/ML SUSP  Inject 8-16 Units into the skin 2 (two) times daily with a meal. 8 units with breakfast, and 16 units with evening meal.     .  Insulin Pen Needle 31G X 8 MM MISC  1 each by Does not apply route 2 (two) times daily.  60 each  11   .  levothyroxine (SYNTHROID, LEVOTHROID) 50 MCG tablet  Take 50 mcg by mouth daily.     Marland Kitchen  diltiazem (CARDIZEM CD) 240 MG 24 hr capsule  Take 240 mg by mouth daily.     Marland Kitchen  ondansetron (ZOFRAN ODT) 8 MG disintegrating tablet  Take 1 tablet (8 mg total) by mouth every 8 (eight) hours as needed for nausea.  20 tablet  0    No Known Allergies  REVIEW OF SYSTEMS: (Positives indicated with an "x", otherwise negative)  CARDIOVASCULAR: [ ]  chest pain [x]  chest pressure [x]  palpitations [x]  orthopnea [x]  dyspnea on exert. [x]  claudication [ ]  rest pain [ ]  DVT [ ]  phlebitis [x]  leg swelling  PULMONARY: [x]  cough [ ]  asthma [ ]  wheezing  NEUROLOGIC: [ ]  weakness [ ]  paresthesias [ ]  aphasia [ ]  amaurosis [x]  dizziness  HEMATOLOGIC: [ ]  bleeding problems [ ]  clotting disorders  MUSCULOSKEL: [ ]  joint pain [ ]  joint swelling  GASTROINTEST: [ ]  blood in stool [ ]  hematemesis  GENITOURINARY: [ ]  dysuria [ ]  hematuria  PSYCHIATRIC: [ ]  history of major depression  INTEGUMENTARY: [ ]  rashes [ ]  ulcers  CONSTITUTIONAL: [ ]  fever [ ]  chills  Physical Examination  Filed Vitals:    11/03/11 1458   BP:  207/101   Pulse:  128   Resp:  18   Height:  5\' 2"  (1.575 m)   Weight:  138 lb (62.596 kg)    Body mass index is 25.24 kg/(m^2).  General: A&O x 3, WDWN, thin  Head: [place]/AT    Ear/Nose/Throat: Hearing grossly intact, nares w/o erythema or drainage, oropharynx w/o Erythema/Exudate  Eyes: PERRLA, EOMI  Neck: Supple, no nuchal rigidity, no palpable LAD  Pulmonary: Sym exp, good air movt, CTAB, no rales, rhonchi, & wheezing  Cardiac: RRR, Nl S1, S2, no Murmurs, rubs or gallops  Vascular:  Vessel  Right  Left   Radial  Palpable  Palpable   Brachial  Palpable  Palpable   Carotid  Palpable, without bruit  Palpable, without bruit   Aorta  Non-palpable  N/A   Femoral  Palpable  Palpable   Popliteal  Non-palpable  Non-palpable   PT  Palpable  Palpable   DP  Palpable  Palpable   Gastrointestinal: soft, NTND, -G/R, - HSM, - masses, - CVAT B  Musculoskeletal: M/S 5/5 throughout , Extremities without ischemic changes  Neurologic: CN 2-12 intact , Pain and light touch intact in extremities , Motor exam as listed above  Psychiatric: Judgment intact, Mood & affect appropriate for pt's clinical situation  Dermatologic: See M/S exam for extremity exam, no rashes otherwise noted  Lymph : No Cervical, Axillary, or Inguinal lymphadenopathy  Non-Invasive Vascular Imaging  Vein Mapping (Date: 11/03/10):  R arm: acceptable vein conduits include none, R upper arm cephalic is marginal but might be worth a look at  L arm: acceptable vein conduits include none Outside Studies/Documentation  5 pages of outside documents were reviewed including: nephrology clinic work-up.  Medical Decision Making  [name] [name] is a 31 y.o. female who presents with chronic kidney disease stage V  Based on vein mapping and examination, this patient's permanent access options include: low possibility of R BC AVF, but most likely a AVG will be needed.  I suspect she does not fully appreciate the magnitude her kidney currently.  Given her young age, consideration for kidney and pancreas transplant might be in order. By report, her family is willing to consider a living related donation.  I had an extensive  discussion with this patient in regards to the nature of access surgery, including risk, benefits, and alternatives.  The patient is aware that the risks of access surgery include but are not limited  to: bleeding, infection, steal syndrome, nerve damage, ischemic monomelic neuropathy, failure of access to mature, and possible need for additional access procedures in the future.  She would to like discuss her options with her nephrologist prior to proceeding. Adele Barthel, MD  [facility]  Office: [phone]  Pager: [phone]  11/03/2011, 5:09 PM

## 2011-11-17 ENCOUNTER — Inpatient Hospital Stay (HOSPITAL_COMMUNITY): Payer: Medicaid Other | Admitting: Anesthesiology

## 2011-11-17 ENCOUNTER — Inpatient Hospital Stay (HOSPITAL_COMMUNITY): Payer: Medicaid Other

## 2011-11-17 ENCOUNTER — Encounter (HOSPITAL_COMMUNITY): Payer: Self-pay | Admitting: Anesthesiology

## 2011-11-17 ENCOUNTER — Encounter (HOSPITAL_COMMUNITY): Payer: Medicaid Other

## 2011-11-17 ENCOUNTER — Telehealth (HOSPITAL_COMMUNITY): Payer: Self-pay

## 2011-11-17 ENCOUNTER — Encounter (HOSPITAL_COMMUNITY)
Admission: EM | Disposition: A | Discharge status: Home / Self Care | Payer: Self-pay | Source: Ambulatory Visit | Attending: Internal Medicine

## 2011-11-17 ENCOUNTER — Ambulatory Visit (HOSPITAL_COMMUNITY): Admission: RE | Admit: 2011-11-17 | Payer: Self-pay | Source: Ambulatory Visit | Admitting: Vascular Surgery

## 2011-11-17 DIAGNOSIS — N186 End stage renal disease: Secondary | ICD-10-CM

## 2011-11-17 HISTORY — PX: AV FISTULA PLACEMENT: SHX1204

## 2011-11-17 LAB — PARATHYROID HORMONE, INTACT (NO CA): PTH: 150.9 pg/mL — ABNORMAL HIGH (ref 14.0–72.0)

## 2011-11-17 LAB — HEPATITIS B SURFACE ANTIGEN: Hepatitis B Surface Ag: NEGATIVE

## 2011-11-17 LAB — GLUCOSE, CAPILLARY
Glucose-Capillary: 66 mg/dL — ABNORMAL LOW (ref 70–99)
Glucose-Capillary: 126 mg/dL — ABNORMAL HIGH (ref 70–99)
Glucose-Capillary: 82 mg/dL (ref 70–99)

## 2011-11-17 LAB — CBC
MCH: 30.5 pg (ref 26.0–34.0)
MCHC: 33.7 g/dL (ref 30.0–36.0)
RDW: 14.3 % (ref 11.5–15.5)

## 2011-11-17 LAB — BASIC METABOLIC PANEL
BUN: 56 mg/dL — ABNORMAL HIGH (ref 6–23)
Creatinine, Ser: 7.19 mg/dL — ABNORMAL HIGH (ref 0.50–1.10)
GFR calc Af Amer: 8 mL/min — ABNORMAL LOW (ref >90)
GFR calc non Af Amer: 7 mL/min — ABNORMAL LOW (ref >90)

## 2011-11-17 LAB — HEPATITIS B CORE ANTIBODY, TOTAL: Hep B Core Total Ab: NEGATIVE

## 2011-11-17 SURGERY — ARTERIOVENOUS (AV) FISTULA CREATION
Anesthesia: Monitor Anesthesia Care | Site: Arm Upper | Laterality: Right | Wound class: Clean

## 2011-11-17 MED ORDER — ONDANSETRON HCL 4 MG/2ML IJ SOLN
INTRAMUSCULAR | Status: AC
  Administered 2011-11-17: 4 mg via INTRAVENOUS
  Filled 2011-11-17: qty 2

## 2011-11-17 MED ORDER — FENTANYL CITRATE 0.05 MG/ML IJ SOLN
25.0000 ug | INTRAMUSCULAR | Status: DC | PRN
  Administered 2011-11-17: 50 ug via INTRAVENOUS

## 2011-11-17 MED ORDER — LIDOCAINE-EPINEPHRINE 0.5-1:200000 % IJ SOLN
INTRAMUSCULAR | Status: DC | PRN
  Administered 2011-11-17: 10 mL

## 2011-11-17 MED ORDER — DEXTROSE 50 % IV SOLN
25.0000 mL | Freq: Once | INTRAVENOUS | Status: AC | PRN
  Administered 2011-11-17: 25 mL via INTRAVENOUS
  Filled 2011-11-17: qty 50

## 2011-11-17 MED ORDER — HYDROCODONE-ACETAMINOPHEN 5-325 MG PO TABS
ORAL_TABLET | ORAL | Status: AC
  Administered 2011-11-17: 2 via ORAL
  Filled 2011-11-17: qty 2

## 2011-11-17 MED ORDER — INSULIN ASPART 100 UNIT/ML ~~LOC~~ SOLN: 0.0000 [IU] | Freq: Every day | SUBCUTANEOUS | Status: DC

## 2011-11-17 MED ORDER — OXYCODONE HCL 5 MG PO TABS
5.0000 mg | ORAL_TABLET | ORAL | Status: DC | PRN
  Administered 2011-11-17 – 2011-11-27 (×18): 5 mg via ORAL
  Filled 2011-11-17 (×16): qty 1

## 2011-11-17 MED ORDER — INSULIN ASPART 100 UNIT/ML ~~LOC~~ SOLN
0.0000 [IU] | Freq: Three times a day (TID) | SUBCUTANEOUS | Status: DC
  Administered 2011-11-19 – 2011-11-20 (×3): 2 [IU] via SUBCUTANEOUS
  Administered 2011-11-20: 1 [IU] via SUBCUTANEOUS
  Administered 2011-11-20 – 2011-11-23 (×6): 2 [IU] via SUBCUTANEOUS
  Administered 2011-11-23 – 2011-11-24 (×2): 1 [IU] via SUBCUTANEOUS
  Administered 2011-11-24: 2 [IU] via SUBCUTANEOUS
  Administered 2011-11-24 – 2011-11-27 (×5): 1 [IU] via SUBCUTANEOUS

## 2011-11-17 MED ORDER — ONDANSETRON HCL 4 MG/2ML IJ SOLN
4.0000 mg | INTRAMUSCULAR | Status: DC | PRN
  Administered 2011-11-17 – 2011-11-27 (×23): 4 mg via INTRAVENOUS
  Filled 2011-11-17 (×24): qty 2

## 2011-11-17 MED ORDER — FENTANYL CITRATE 0.05 MG/ML IJ SOLN
INTRAMUSCULAR | Status: DC | PRN
  Administered 2011-11-17 (×3): 50 ug via INTRAVENOUS

## 2011-11-17 MED ORDER — 0.9 % SODIUM CHLORIDE (POUR BTL) OPTIME
TOPICAL | Status: DC | PRN
  Administered 2011-11-17: 1000 mL

## 2011-11-17 MED ORDER — DARBEPOETIN ALFA-POLYSORBATE 100 MCG/0.5ML IJ SOLN
INTRAMUSCULAR | Status: AC
  Administered 2011-11-17: 100 ug via INTRAVENOUS
  Filled 2011-11-17: qty 0.5

## 2011-11-17 MED ORDER — PROPOFOL 10 MG/ML IV EMUL
INTRAVENOUS | Status: DC | PRN
  Administered 2011-11-17 (×2): 25 ug/kg/min via INTRAVENOUS

## 2011-11-17 MED ORDER — MIDAZOLAM HCL 5 MG/5ML IJ SOLN
INTRAMUSCULAR | Status: DC | PRN
  Administered 2011-11-17: 2 mg via INTRAVENOUS

## 2011-11-17 MED ORDER — INSULIN GLARGINE 100 UNIT/ML ~~LOC~~ SOLN
6.0000 [IU] | Freq: Every day | SUBCUTANEOUS | Status: DC
  Administered 2011-11-17: 6 [IU] via SUBCUTANEOUS

## 2011-11-17 MED ORDER — SODIUM CHLORIDE 0.9 % IV SOLN
INTRAVENOUS | Status: DC | PRN
  Administered 2011-11-17: 12:00:00 via INTRAVENOUS

## 2011-11-17 MED ORDER — SODIUM CHLORIDE 0.9 % IR SOLN
Status: DC | PRN
  Administered 2011-11-17: 13:00:00

## 2011-11-17 MED FILL — Heparin Sodium (Porcine) Inj 1000 Unit/ML: INTRAMUSCULAR | Qty: 10 | Status: AC

## 2011-11-17 SURGICAL SUPPLY — 35 items
BENZOIN TINCTURE PRP APPL 2/3 (GAUZE/BANDAGES/DRESSINGS) ×3 IMPLANT
CANISTER SUCTION 2500CC (MISCELLANEOUS) ×3 IMPLANT
CLIP LIGATING EXTRA MED SLVR (CLIP) ×3 IMPLANT
CLIP LIGATING EXTRA SM BLUE (MISCELLANEOUS) ×3 IMPLANT
CLOTH BEACON ORANGE TIMEOUT ST (SAFETY) ×3 IMPLANT
CLSR STERI-STRIP ANTIMIC 1/2X4 (GAUZE/BANDAGES/DRESSINGS) ×3 IMPLANT
COVER SURGICAL LIGHT HANDLE (MISCELLANEOUS) ×6 IMPLANT
DECANTER SPIKE VIAL GLASS SM (MISCELLANEOUS) IMPLANT
ELECT REM PT RETURN 9FT ADLT (ELECTROSURGICAL) ×3
ELECTRODE REM PT RTRN 9FT ADLT (ELECTROSURGICAL) ×2 IMPLANT
GEL ULTRASOUND 20GR AQUASONIC (MISCELLANEOUS) IMPLANT
GLOVE BIOGEL PI IND STRL 6.5 (GLOVE) ×2 IMPLANT
GLOVE BIOGEL PI IND STRL 7.5 (GLOVE) ×8 IMPLANT
GLOVE BIOGEL PI INDICATOR 6.5 (GLOVE) ×1
GLOVE BIOGEL PI INDICATOR 7.5 (GLOVE) ×4
GLOVE SS BIOGEL STRL SZ 7.5 (GLOVE) ×4 IMPLANT
GLOVE SUPERSENSE BIOGEL SZ 7.5 (GLOVE) ×2
GLOVE SURG SS PI 7.5 STRL IVOR (GLOVE) ×6 IMPLANT
GOWN STRL NON-REIN LRG LVL3 (GOWN DISPOSABLE) ×12 IMPLANT
KIT BASIN OR (CUSTOM PROCEDURE TRAY) ×3 IMPLANT
KIT ROOM TURNOVER OR (KITS) ×3 IMPLANT
NS IRRIG 1000ML POUR BTL (IV SOLUTION) ×3 IMPLANT
PACK CV ACCESS (CUSTOM PROCEDURE TRAY) ×3 IMPLANT
PAD ARMBOARD 7.5X6 YLW CONV (MISCELLANEOUS) ×6 IMPLANT
SPONGE GAUZE 4X4 12PLY (GAUZE/BANDAGES/DRESSINGS) ×3 IMPLANT
STRIP CLOSURE SKIN 1/2X4 (GAUZE/BANDAGES/DRESSINGS) ×3 IMPLANT
SUT PROLENE 6 0 CC (SUTURE) ×3 IMPLANT
SUT SILK 2 0 FS (SUTURE) IMPLANT
SUT VIC AB 3-0 SH 27 (SUTURE) ×2
SUT VIC AB 3-0 SH 27X BRD (SUTURE) ×4 IMPLANT
TAPE CLOTH SURG 4X10 WHT LF (GAUZE/BANDAGES/DRESSINGS) ×3 IMPLANT
TOWEL OR 17X24 6PK STRL BLUE (TOWEL DISPOSABLE) ×3 IMPLANT
TOWEL OR 17X26 10 PK STRL BLUE (TOWEL DISPOSABLE) ×3 IMPLANT
UNDERPAD 30X30 INCONTINENT (UNDERPADS AND DIAPERS) ×3 IMPLANT
WATER STERILE IRR 1000ML POUR (IV SOLUTION) ×3 IMPLANT

## 2011-11-17 NOTE — H&P (View-Only) (Signed)
VVS pre-op note  Pt. Seen by Dr. Bridgett Larsson 11/03/11 for placement HD ACCESS IN LEFT Arm see note below Based on vein mapping and examination, this patient's permanent access options include: low possibility of R BC AVF, but most likely a AVG will be needed.  Orders written for OR 11/17/11 Pt. Aware  IR to place IDC today so that Pt. May be dialyzed today OK to proceed with AVF/AVGG per Dr. Mercy Moore.  Norman J 11/16/2011 11:48 AM       [facility] H[facility]P Referred by:  Mack Hook, MD  [place]  [place]  [place], [place] [address]  Reason for referral: New access  History of Present Illness  [name] [name] is a 31 y.o. (1980-11-16) female who presents for evaluation for permanent access. The patient is right hand dominant. The patient has not had previous access procedures. Previous central venous cannulation procedures include: none. The patient has never had a PPM placed. Source of her chronic kidney disease failure is insulin dependent diabetes mellitus.  Past Medical History   Diagnosis  Date   .  DIABETES MELLITUS, TYPE I, UNCONTROLLED  11/05/2007   .  MEDIAL EPICONDYLITIS, LEFT  12/17/2007   .  Vision loss    .  Renal disorder    .  Diabetes mellitus    .  Hypertension     Past Surgical History   Procedure  Date   .  Refractive surgery    .  Eye surgery     History    Social History   .  Marital Status:  Single     Spouse Name:  N/A     Number of Children:  N/A   .  Years of Education:  N/A    Occupational History   .  Not on file.    Social History Main Topics   .  Smoking status:  Former Smoker -- 1 years     Types:  Cigarettes   .  Smokeless tobacco:  Never Used   .  Alcohol Use:  No   .  Drug Use:  No   .  Sexually Active:  Yes     Birth Control/ Protection:  Pill    Other Topics  Concern   .  Not on file    Social History Narrative    Worked for [employer]--as did husband. Had to leave [place] for safety  reasons. Has been in [place] since 9/09.    Family History   Problem  Relation  Age of Onset   .  Hypertension  Father     Current Outpatient Prescriptions on File Prior to Visit   Medication  Sig  Dispense  Refill   .  furosemide (LASIX) 40 MG tablet  Take 2 tablets (80 mg total) by mouth daily.  30 tablet  0   .  insulin lispro protamine-insulin lispro (HUMALOG 75/25) (75-25) 100 UNIT/ML SUSP  Inject 8-16 Units into the skin 2 (two) times daily with a meal. 8 units with breakfast, and 16 units with evening meal.     .  Insulin Pen Needle 31G X 8 MM MISC  1 each by Does not apply route 2 (two) times daily.  60 each  11   .  levothyroxine (SYNTHROID, LEVOTHROID) 50 MCG tablet  Take 50 mcg by mouth daily.     Marland Kitchen  diltiazem (CARDIZEM CD) 240 MG 24 hr capsule  Take 240 mg by mouth daily.     Marland Kitchen  ondansetron (ZOFRAN ODT) 8 MG disintegrating tablet  Take 1 tablet (8 mg total) by mouth every 8 (eight) hours as needed for nausea.  20 tablet  0    No Known Allergies  REVIEW OF SYSTEMS: (Positives indicated with an "x", otherwise negative)  CARDIOVASCULAR: [ ]  chest pain [x]  chest pressure [x]  palpitations [x]  orthopnea [x]  dyspnea on exert. [x]  claudication [ ]  rest pain [ ]  DVT [ ]  phlebitis [x]  leg swelling  PULMONARY: [x]  cough [ ]  asthma [ ]  wheezing  NEUROLOGIC: [ ]  weakness [ ]  paresthesias [ ]  aphasia [ ]  amaurosis [x]  dizziness  HEMATOLOGIC: [ ]  bleeding problems [ ]  clotting disorders  MUSCULOSKEL: [ ]  joint pain [ ]  joint swelling  GASTROINTEST: [ ]  blood in stool [ ]  hematemesis  GENITOURINARY: [ ]  dysuria [ ]  hematuria  PSYCHIATRIC: [ ]  history of major depression  INTEGUMENTARY: [ ]  rashes [ ]  ulcers  CONSTITUTIONAL: [ ]  fever [ ]  chills  Physical Examination  Filed Vitals:    11/03/11 1458   BP:  207/101   Pulse:  128   Resp:  18   Height:  5\' 2"  (1.575 m)   Weight:  138 lb (62.596 kg)    Body mass index is 25.24 kg/(m^2).  General: A&O x 3, WDWN, thin  Head: [place]/AT    Ear/Nose/Throat: Hearing grossly intact, nares w/o erythema or drainage, oropharynx w/o Erythema/Exudate  Eyes: PERRLA, EOMI  Neck: Supple, no nuchal rigidity, no palpable LAD  Pulmonary: Sym exp, good air movt, CTAB, no rales, rhonchi, & wheezing  Cardiac: RRR, Nl S1, S2, no Murmurs, rubs or gallops  Vascular:  Vessel  Right  Left   Radial  Palpable  Palpable   Brachial  Palpable  Palpable   Carotid  Palpable, without bruit  Palpable, without bruit   Aorta  Non-palpable  N/A   Femoral  Palpable  Palpable   Popliteal  Non-palpable  Non-palpable   PT  Palpable  Palpable   DP  Palpable  Palpable   Gastrointestinal: soft, NTND, -G/R, - HSM, - masses, - CVAT B  Musculoskeletal: M/S 5/5 throughout , Extremities without ischemic changes  Neurologic: CN 2-12 intact , Pain and light touch intact in extremities , Motor exam as listed above  Psychiatric: Judgment intact, Mood & affect appropriate for pt's clinical situation  Dermatologic: See M/S exam for extremity exam, no rashes otherwise noted  Lymph : No Cervical, Axillary, or Inguinal lymphadenopathy  Non-Invasive Vascular Imaging  Vein Mapping (Date: 11/03/10):  R arm: acceptable vein conduits include none, R upper arm cephalic is marginal but might be worth a look at  L arm: acceptable vein conduits include none Outside Studies/Documentation  5 pages of outside documents were reviewed including: nephrology clinic work-up.  Medical Decision Making  [name] [name] is a 31 y.o. female who presents with chronic kidney disease stage V  Based on vein mapping and examination, this patient's permanent access options include: low possibility of R BC AVF, but most likely a AVG will be needed.  I suspect she does not fully appreciate the magnitude her kidney currently.  Given her young age, consideration for kidney and pancreas transplant might be in order. By report, her family is willing to consider a living related donation.  I had an extensive  discussion with this patient in regards to the nature of access surgery, including risk, benefits, and alternatives.  The patient is aware that the risks of access surgery include but are not limited  to: bleeding, infection, steal syndrome, nerve damage, ischemic monomelic neuropathy, failure of access to mature, and possible need for additional access procedures in the future.  She would to like discuss her options with her nephrologist prior to proceeding. Adele Barthel, MD  [facility]  Office: [phone]  Pager: [phone]  11/03/2011, 5:09 PM

## 2011-11-17 NOTE — Anesthesia Postprocedure Evaluation (Signed)
  Anesthesia Post-op Note  Patient: [name] [name]  Procedure(s) Performed: Procedure(s) (LRB): ARTERIOVENOUS (AV) FISTULA CREATION (Right)  Patient Location: PACU  Anesthesia Type: MAC  Level of Consciousness: awake, alert  and oriented  Airway and Oxygen Therapy: Patient Spontanous Breathing  Post-op Pain: none  Post-op Assessment: Post-op Vital signs reviewed, Patient's Cardiovascular Status Stable, Respiratory Function Stable, Patent Airway, No signs of Nausea or vomiting and Pain level controlled  Post-op Vital Signs: Reviewed and stable  Complications: No apparent anesthesia complications\

## 2011-11-17 NOTE — Progress Notes (Signed)
SUBJECTIVE:  Denies SOB or pain.   PHYSICAL EXAM Filed Vitals:   11/16/11 1830 11/16/11 2100 11/17/11 0028 11/17/11 0500  BP: 174/92 173/95 176/83 178/95  Pulse: 90 93 95 97  Temp:  98 F (36.7 C) 97.8 F (36.6 C) 98.2 F (36.8 C)  TempSrc:  Oral Oral Oral  Resp:   15 18  Height:      Weight:      SpO2: 92% 91% 95% 95%   General:  No distress Neck:  No JVD Lungs:  Decreased BS slightly on the left base Heart:  RRR, no rub Abdomen:  Positive bowel sounds, no rebound no guarding Extremities:  No edema.  LABS: No results found for this basename: CKTOTAL,  CKMB,  CKMBINDEX,  TROPONINI   Results for orders placed during the hospital encounter of 11/15/11 (from the past 24 hour(s))  GLUCOSE, CAPILLARY     Status: Normal   Collection Time   11/16/11  8:17 AM      Component Value Range   Glucose-Capillary 92  70 - 99 (mg/dL)   Comment 1 Notify RN    GLUCOSE, CAPILLARY     Status: Normal   Collection Time   11/16/11 10:52 AM      Component Value Range   Glucose-Capillary 70  70 - 99 (mg/dL)  GLUCOSE, CAPILLARY     Status: Normal   Collection Time   11/16/11 12:31 PM      Component Value Range   Glucose-Capillary 71  70 - 99 (mg/dL)   Comment 1 Notify RN    GLUCOSE, CAPILLARY     Status: Normal   Collection Time   11/16/11  5:16 PM      Component Value Range   Glucose-Capillary 84  70 - 99 (mg/dL)   Comment 1 Notify RN    GLUCOSE, CAPILLARY     Status: Normal   Collection Time   11/16/11  8:27 PM      Component Value Range   Glucose-Capillary 99  70 - 99 (mg/dL)  GLUCOSE, CAPILLARY     Status: Normal   Collection Time   11/17/11 12:12 AM      Component Value Range   Glucose-Capillary 83  70 - 99 (mg/dL)  BASIC METABOLIC PANEL     Status: Abnormal   Collection Time   11/17/11  4:30 AM      Component Value Range   Sodium 135  135 - 145 (mEq/L)   Potassium 4.3  3.5 - 5.1 (mEq/L)   Chloride 104  96 - 112 (mEq/L)   CO2 18 (*) 19 - 32 (mEq/L)   Glucose, Bld 74  70 -  99 (mg/dL)   BUN 56 (*) 6 - 23 (mg/dL)   Creatinine, Ser 7.19 (*) 0.50 - 1.10 (mg/dL)   Calcium 8.7  8.4 - 10.5 (mg/dL)   GFR calc non Af Amer 7 (*) >90 (mL/min)   GFR calc Af Amer 8 (*) >90 (mL/min)  CBC     Status: Abnormal   Collection Time   11/17/11  4:30 AM      Component Value Range   WBC 7.1  4.0 - 10.5 (K/uL)   RBC 3.28 (*) 3.87 - 5.11 (MIL/uL)   Hemoglobin 10.0 (*) 12.0 - 15.0 (g/dL)   HCT 29.7 (*) 36.0 - 46.0 (%)   MCV 90.5  78.0 - 100.0 (fL)   MCH 30.5  26.0 - 34.0 (pg)   MCHC 33.7  30.0 - 36.0 (g/dL)  RDW 14.3  11.5 - 15.5 (%)   Platelets 170  150 - 400 (K/uL)  GLUCOSE, CAPILLARY     Status: Normal   Collection Time   11/17/11  5:08 AM      Component Value Range   Glucose-Capillary 80  70 - 99 (mg/dL)    Intake/Output Summary (Last 24 hours) at 11/17/11 0727 Last data filed at 11/17/11 0500  Gross per 24 hour  Intake    130 ml  Output   2200 ml  Net  -2070 ml    Echo:  Moderate to large pericardial effusion with moderate RA collapse and mild RV collapse; IVC collapses; no significant respiratory flow variation.  ASSESSMENT AND PLAN:   1. Pericardial effusion/Pleural effusion:  She has a moderate to large pericardial effusion.  However she does not have any clinical findings of tamponade physiology.  She has no respiratory distress and she is hypertensive.  Effusion likely secondary to volume overload and ESRD.  Continue volume management per renal.  We should reassess the size of the effusion with a repeat limited echo before discharge.  2. Abdominal Pain: Feeling better with dialysis  3. ESRD:  Currently in dialysis  Minus Breeding 11/17/2011 7:27 AM

## 2011-11-17 NOTE — Progress Notes (Signed)
CBG: 66  Treatment: D50 IV 25 mL  Symptoms: Shaky and Hungry  Follow-up CBG: Time:1108 CBG Result:126  Possible Reasons for Event: Vomiting and Inadequate meal intake  Comments/MD notified: Rizwan MD    [name] [name], [name] [name]

## 2011-11-17 NOTE — Preoperative (Signed)
Beta Blockers   Reason not to administer Beta Blockers:{MEDS BETA BLOCKER REASONS NOT PRESCRIBED PQRI 7:20105}

## 2011-11-17 NOTE — Procedures (Signed)
Pt seen on HD. Ap 180 Vp 50.  tolerating HD well.  To go for access after HD today.  Plan HD again in AM

## 2011-11-17 NOTE — Anesthesia Preprocedure Evaluation (Addendum)
Anesthesia Evaluation  Patient identified by MRN, date of birth, ID band Patient awake    Reviewed: Allergy & Precautions, H&P , NPO status , Patient's Chart, lab work & pertinent test results  Airway Mallampati: III TM Distance: >3 FB Neck ROM: Full    Dental No notable dental hx. (+) Teeth Intact   Pulmonary neg pulmonary ROS,    Pulmonary exam normal       Cardiovascular hypertension, Pt. on medications Rhythm:Regular Rate:Normal     Neuro/Psych negative neurological ROS  negative psych ROS   GI/Hepatic negative GI ROS, Neg liver ROS,   Endo/Other  Diabetes mellitus-, Poorly Controlled, Type 1, Insulin DependentHypothyroidism   Renal/GU ESRF and DialysisRenal disease  negative genitourinary   Musculoskeletal negative musculoskeletal ROS (+)   Abdominal Normal abdominal exam  (+)   Peds  Hematology  (+) Blood dyscrasia, anemia ,   Anesthesia Other Findings   Reproductive/Obstetrics negative OB ROS                          Anesthesia Physical Anesthesia Plan  ASA: IV  Anesthesia Plan: MAC   Post-op Pain Management:    Induction: Intravenous  Airway Management Planned: Natural Airway  Additional Equipment:   Intra-op Plan:   Post-operative Plan:   Informed Consent: I have reviewed the patients History and Physical, chart, labs and discussed the procedure including the risks, benefits and alternatives for the proposed anesthesia with the patient or authorized representative who has indicated his/her understanding and acceptance.   Dental advisory given  Plan Discussed with: CRNA, Anesthesiologist and Surgeon  Anesthesia Plan Comments:        Anesthesia Quick Evaluation

## 2011-11-17 NOTE — Transfer of Care (Signed)
Immediate Anesthesia Transfer of Care Note  Patient: [name] [name]  Procedure(s) Performed: Procedure(s) (LRB): ARTERIOVENOUS (AV) FISTULA CREATION (Right)  Patient Location: PACU  Anesthesia Type: MAC  Level of Consciousness: awake, alert , oriented and patient cooperative  Airway & Oxygen Therapy: Patient Spontanous Breathing and Patient connected to nasal cannula oxygen  Post-op Assessment: Report given to PACU RN, Post -op Vital signs reviewed and stable and Patient moving all extremities X 4  Post vital signs: Reviewed and stable  Complications: No apparent anesthesia complications

## 2011-11-17 NOTE — Progress Notes (Signed)
[name]  Subjective: Weak but breathing better.  States she tolerated first hemodialysis treatment okay today  Objective: Vital signs in last 24 hours: Temp:  [97.6 F (36.4 C)-98.6 F (37 C)] 97.8 F (36.6 C) (03/15 1030) Pulse Rate:  [90-105] 97  (03/15 1030) Resp:  [13-32] 20  (03/15 0937) BP: (164-192)/(83-104) 164/85 mmHg (03/15 1030) SpO2:  [90 %-95 %] 93 % (03/15 1030) Weight:  [64.6 kg (142 lb 6.7 oz)-66.1 kg (145 lb 11.6 oz)] 64.6 kg (142 lb 6.7 oz) (03/15 0937) Weight change:  Last BM Date: 11/15/11  Intake/Output from previous day: 03/14 0701 - 03/15 0700 In: 130 [I.V.:130] Out: 2200 [Urine:2200] Intake/Output this shift: Total I/O In: 120 [I.V.:10; IV Piggyback:110] Out: 1750 [Urine:500; Other:1250]  General appearance: alert, appears older than stated age, cachectic, no distress, pale and syndromic appearance - uremic Resp: Coarse but clear to auscultation bilaterally. Also marked decrease sounds bibasilar Cardio: regular rate and rhythm, S1, S2 normal, no murmur, click, rub or gallop GI: soft, non-tender; bowel sounds normal; no masses,  no organomegaly Extremities: extremities normal, atraumatic, no cyanosis or edema Neurologic: Grossly normal  Lab Results:  Basename 11/17/11 0430 11/16/11 0455  WBC 7.1 6.4  HGB 10.0* 8.9*  HCT 29.7* 26.5*  PLT 170 158   BMET  Basename 11/17/11 0430 11/16/11 0455  NA 135 138  K 4.3 4.3  CL 104 109  CO2 18* 20  GLUCOSE 74 109*  BUN 56* 55*  CREATININE 7.19* 6.84*  CALCIUM 8.7 8.4    Studies/Results: US Abdomen Complete  11/15/2011  *RADIOLOGY REPORT*  Clinical Data:  Epigastric pain. Rule out biliary disease. Chronic renal failure. Type 1 diabetes.  ABDOMINAL ULTRASOUND COMPLETE  Comparison:  CT abdomen pelvis 02/20/2010  Findings:  Inferior chest:  A significant pericardial effusion is partially visualized.  Prominent bilateral pleural effusions are noted.  Gallbladder:  The gallbladder wall  thickness measures slightly prominent at 3.5 mm.  No gallstones or sludge is seen.  No definite sonographic Murphy's sign is seen.  The sonographer reports the patient has generalized abdominal pain with palpation.  Common Bile Duct:  Within normal limits in caliber. Measures 2.9 mm.  Liver: No focal mass lesion identified.  Within normal limits in parenchymal echogenicity.  IVC:  Appears normal.  Pancreas:  Although the pancreas is difficult to visualize in its entirety, no focal pancreatic abnormality is identified.  Spleen:  Measures greater than 13.9 cm in length (cannot be completely imaged on one ultrasound screen).  Calculated splenic volume is estimated to be 600 cm cubed.  No focal splenic lesion.  Right kidney:  Normal in size.  Measures 12.1 cm.  Increased echogenicity of the right kidney. No evidence of mass or hydronephrosis.  Left kidney:  Normal in size.  Measures 12.2 cm.  Increased echogenicity of the left kidney.  No evidence of mass or hydronephrosis.  Abdominal Aorta:  No aneurysm identified.  No ascites is identified on these images.  IMPRESSION:  1.  Significant pericardial effusion noted. 2.  Large bilateral pleural effusions. 3.  Mild diffuse gallbladder wall thickening.  Negative for stones or sludge.  Findings are nonspecific, and can be related to other entitities besides cholecystitis, such as hypoproteinemia, liver disease, pancreatitis, congestive heart failure, ascites, etc.eight calculus acute cholecystitis cannot be completely excluded; nuclear medicine imaging could be performed, if clinically indicated. 4.  Splenomegaly. 5.  Increased echogenicity of both kidneys consistent with history of chronic renal failure.  Original Report Authenticated By: Manuela Schwartz  TURNER, M.D.   Ir Fluoro Guide Cv Line Right  11/16/2011  *RADIOLOGY REPORT*  Clinical Data: Acute renal failure requiring hemodialysis.  TUNNELED CENTRAL VENOUS HEMODIALYSIS CATHETER PLACEMENT WITH ULTRASOUND AND FLUOROSCOPIC  GUIDANCE  Sedation:  1.5 mg IV Versed; 75 mcg IV Fentanyl.  Total Moderate Sedation Time:  30 minutes.  Additional Medications:  1 gram IV Ancef.  As antibiotic prophylaxis, Ancef 1 gm was ordered pre-procedure and administered intravenously within one hour of incision.  Fluoroscopy Time:  0.7 minutes.  Procedure:  The procedure, risks, benefits, and alternatives were explained to the patient.  Questions regarding the procedure were encouraged and answered.  The patient understands and consents to the procedure.  The right neck and chest were prepped with chlorhexidine in a sterile fashion, and a sterile drape was applied covering the operative field.  Maximum barrier sterile technique with sterile gowns and gloves were used for the procedure.  Local anesthesia was provided with 1% lidocaine.  After creating a small venotomy incision, a 21 gauge needle was advanced into the right internal jugular vein under direct, real- time ultrasound guidance.  Ultrasound image documentation was performed.  After securing guidewire access, an 8 Fr dilator was placed.  A J-wire was kinked to measure appropriate catheter length.  A Bard HemoSplit tunneled hemodialysis catheter measuring 19 cm from tip to cuff was chosen for placement.  This was tunneled in a retrograde fashion from the chest wall to the venotomy incision.  At the venotomy, serial dilatation was performed and a 16 Fr peel- away sheath was placed over a guidewire.  The catheter was then placed through the sheath and the sheath removed.  Final catheter positioning was confirmed and documented with a fluoroscopic spot image.  The catheter was aspirated, flushed with saline, and injected with appropriate volume heparin dwells.  The venotomy incision was closed with subcutaneous 3-0 Monocryl and subcuticular 4-0 Vicryl.  Dermabond was applied to the incision. The catheter exit site was secured with 0-Prolene retention sutures.  Complications: None.  No pneumothorax.   Findings:  After catheter placement, the tips lie in the right atrium.  The catheter aspirates normally and is ready for immediate use.  IMPRESSION:  Placement of tunneled hemodialysis catheter via the right internal jugular vein.  The catheter tips lie in the right atrium.  The catheter is ready for immediate use.  Original Report Authenticated By: Azzie Roup, M.D.   Ir US Guide Vasc Access Right  11/16/2011  *RADIOLOGY REPORT*  Clinical Data: Acute renal failure requiring hemodialysis.  TUNNELED CENTRAL VENOUS HEMODIALYSIS CATHETER PLACEMENT WITH ULTRASOUND AND FLUOROSCOPIC GUIDANCE  Sedation:  1.5 mg IV Versed; 75 mcg IV Fentanyl.  Total Moderate Sedation Time:  30 minutes.  Additional Medications:  1 gram IV Ancef.  As antibiotic prophylaxis, Ancef 1 gm was ordered pre-procedure and administered intravenously within one hour of incision.  Fluoroscopy Time:  0.7 minutes.  Procedure:  The procedure, risks, benefits, and alternatives were explained to the patient.  Questions regarding the procedure were encouraged and answered.  The patient understands and consents to the procedure.  The right neck and chest were prepped with chlorhexidine in a sterile fashion, and a sterile drape was applied covering the operative field.  Maximum barrier sterile technique with sterile gowns and gloves were used for the procedure.  Local anesthesia was provided with 1% lidocaine.  After creating a small venotomy incision, a 21 gauge needle was advanced into the right internal jugular vein under direct, real-  time ultrasound guidance.  Ultrasound image documentation was performed.  After securing guidewire access, an 8 Fr dilator was placed.  A J-wire was kinked to measure appropriate catheter length.  A Bard HemoSplit tunneled hemodialysis catheter measuring 19 cm from tip to cuff was chosen for placement.  This was tunneled in a retrograde fashion from the chest wall to the venotomy incision.  At the venotomy, serial  dilatation was performed and a 16 Fr peel- away sheath was placed over a guidewire.  The catheter was then placed through the sheath and the sheath removed.  Final catheter positioning was confirmed and documented with a fluoroscopic spot image.  The catheter was aspirated, flushed with saline, and injected with appropriate volume heparin dwells.  The venotomy incision was closed with subcutaneous 3-0 Monocryl and subcuticular 4-0 Vicryl.  Dermabond was applied to the incision. The catheter exit site was secured with 0-Prolene retention sutures.  Complications: None.  No pneumothorax.  Findings:  After catheter placement, the tips lie in the right atrium.  The catheter aspirates normally and is ready for immediate use.  IMPRESSION:  Placement of tunneled hemodialysis catheter via the right internal jugular vein.  The catheter tips lie in the right atrium.  The catheter is ready for immediate use.  Original Report Authenticated By: Azzie Roup, M.D.    Medications:  I have reviewed the patient's current medications. Scheduled:    . calcium carbonate  1 tablet Oral TID WC  . ceFAZolin      .  ceFAZolin (ANCEF) IV  1 g Intravenous On Call  . darbepoetin (ARANESP) injection - DIALYSIS  100 mcg Intravenous Q Thu-HD  . diltiazem  240 mg Oral Daily  . fentaNYL      . ferric gluconate (FERRLECIT/NULECIT) IV  125 mg Intravenous Q T,Th,Sa-HD  . furosemide  160 mg Intravenous BID  . hydrALAZINE  50 mg Oral BID  . insulin aspart  0-9 Units Subcutaneous Q4H  . insulin glargine  6 Units Subcutaneous QHS  . levothyroxine  50 mcg Oral Q breakfast  . metoprolol      . midazolam      . multivitamin  1 tablet Oral QHS  . pantoprazole (PROTONIX) IV  40 mg Intravenous Q12H  . sodium chloride  3 mL Intravenous Q12H  . sodium chloride  3 mL Intravenous Q12H  . DISCONTD: insulin glargine  12 Units Subcutaneous QHS    Assessment/Plan:  Principal Problem:  CKD (chronic kidney disease) stage 5, GFR less  than 15 ml/min * Temporary HD cath placed 11/16/2011 and initial hemodialysis treatment begun 11/17/2011 * Lasix was to 160 mg IV BID on 11/16/2011 and currently is in a -2000 cc balance pre-dialysis; since dialysis has been initiated we'll discontinue Lasix. *Follow renal panel post HD *Status post placement of right upper arm AV fistula today by Dr. Sherren Mocha Early  Active Problems:  Pericardial effusion/Fluid overload/Pleural effusion, bilateral * Due to progressive renal failure and anticipate will resolve after HD initiated * Cardiology following- patient is hemodynamically stable and without signs of tamponade physiology on exam -ECHO completed shows moderate to large pericardial effusion with moderate RA collapse and mild RV collapse. Cardiology feels effusion will improve after dialysis the plan is to reassess the size of the pericardial effusion with a repeat limited echo before discharge. * ECHO also shows preserved LV function and grade 1 diastolic dysfunction * Symptom management   DIABETES MELLITUS, TYPE I, UNCONTROLLED *has been diabetic for 5 years *current CBG  well controlled * last HgbA1c in system was 10.8 in 2011 *continue Lantus but due to persistent hypoglycemia while n.p.o. we'll decrease Lantus from 12-6 units daily and will continue SSI  Hypoglycemia * Relative with CBG down to 70. She is NPO for surgery so will continue her KVO fluids with dextrose and hypoglycemia protocol   Hypertension * moderate control; added PRN IV Lopressor * expect improvement with HD    *Anemia in chronic kidney disease (CKD) * management per renal * started on Aranesp and IV iron   Abdominal pain, epigastric * resolved and likely r/t uremia   Disposition * remain in stepdown    LOS: 2 days   Erin Hearing, ANP pager [phone]  [name] 1 [email or web address] Password: [email or web address]  11/17/2011, 1:55 PM  I have examined the patient, reviewed the chart and discussed the plan  with Erin Hearing, NP. I agree with the above note which I have modified.   Debbe Odea, MD [phone]  Clarification: She has been diabetic for 13 years and not 5 years as previously documented.  Erin Hearing, ANP

## 2011-11-17 NOTE — Op Note (Signed)
OPERATIVE REPORT  DATE OF SURGERY: 11/17/2011  PATIENT: [name] [name], 31 y.o. female MRN: [id number]  DOB: July 21, 1981  PRE-OPERATIVE DIAGNOSIS: End-stage renal disease  POST-OPERATIVE DIAGNOSIS:  Same  PROCEDURE: Right upper arm AV fistula creation  SURGEON:  Curt Jews, M.D.  PHYSICIAN ASSISTANT: Collins  ANESTHESIA:  Local with sedation  EBL: Minimal ml  Total I/O In: 120 [I.V.:10; IV Piggyback:110] Out: 1750 [Urine:500; Other:1250]  BLOOD ADMINISTERED: None  DRAINS: None  SPECIMEN: None  COUNTS CORRECT:  YES  PLAN OF CARE: PACU   PATIENT DISPOSITION:  PACU - hemodynamically stable  PROCEDURE DETAILS: The patient was taken to the operative and placed supine position where the area of the right arm was imaged with ultrasound. This showed acceptable caliber of cephalic vein at the antecubital vein proximally. Using local anesthesia an incision was made as her serum antecubital space and couldn't isolate the cephalic vein and brachial artery. Tributary branches off the vein were ligated with 30 4 silk ties and divided. The vein was mobilized and ligated distally and divided. The vein was gently dilated with heparinized saline was found to be of good caliber. The vein was brought into approximation of the brachial artery at the antecubital space. The artery was occluded proximal and distally was opened 11 blade and sent longitudinally with Potts scissors. The vein was spatulated and sewn end to side to the artery with a running 6-0 Prolene suture. Clamps removed and excellent thrill was noted. The wounds were irrigated with saline. Hemostasis was obtained with cautery. Wound was closed with 3-0 Vicryl in the subcutaneous and subcuticular tissue. Sterile dressing was applied.   Curt Jews, M.D. 11/17/2011 1:48 PM

## 2011-11-17 NOTE — Interval H&P Note (Signed)
History and Physical Interval Note:  11/17/2011 12:03 PM  [name] [name]  has presented today for surgery, with the diagnosis of End Stage Renal Disease  The various methods of treatment have been discussed with the patient and family. After consideration of risks, benefits and other options for treatment, the patient has consented to  Procedure(s) (LRB): INSERTION OF ARTERIOVENOUS (AV) GORE-TEX GRAFT ARM (Left) as a surgical intervention .  The patients' history has been reviewed, patient examined, no change in status, stable for surgery.  I have reviewed the patients' chart and labs.  Questions were answered to the patient's satisfaction.     Estanislao Harmon

## 2011-11-17 NOTE — Progress Notes (Signed)
Patient ID: [name] [name], female   DOB: [birth year], 32 y.o.   MRN: [id number] The patient had been scheduled for left arm access.  In reviewing her vein map, she does not have acceptible vein on the left for a fistula. Discussed with pt.  For Right arm avf or avgg

## 2011-11-18 ENCOUNTER — Inpatient Hospital Stay (HOSPITAL_COMMUNITY): Payer: Medicaid Other

## 2011-11-18 DIAGNOSIS — N19 Unspecified kidney failure: Secondary | ICD-10-CM

## 2011-11-18 LAB — CBC
MCH: 29.6 pg (ref 26.0–34.0)
MCV: 89.4 fL (ref 78.0–100.0)
Platelets: 172 10*3/uL (ref 150–400)
RDW: 13.9 % (ref 11.5–15.5)
WBC: 8.7 10*3/uL (ref 4.0–10.5)

## 2011-11-18 LAB — RENAL FUNCTION PANEL
Albumin: 2.3 g/dL — ABNORMAL LOW (ref 3.5–5.2)
Calcium: 8.5 mg/dL (ref 8.4–10.5)
Creatinine, Ser: 6.36 mg/dL — ABNORMAL HIGH (ref 0.50–1.10)
GFR calc non Af Amer: 8 mL/min — ABNORMAL LOW (ref >90)

## 2011-11-18 LAB — GLUCOSE, CAPILLARY
Glucose-Capillary: 69 mg/dL — ABNORMAL LOW (ref 70–99)
Glucose-Capillary: 75 mg/dL (ref 70–99)

## 2011-11-18 MED ORDER — MORPHINE SULFATE 2 MG/ML IJ SOLN
INTRAMUSCULAR | Status: AC
  Filled 2011-11-18: qty 1

## 2011-11-18 MED ORDER — PANTOPRAZOLE SODIUM 40 MG PO TBEC
40.0000 mg | DELAYED_RELEASE_TABLET | Freq: Two times a day (BID) | ORAL | Status: DC
  Administered 2011-11-18 – 2011-11-27 (×17): 40 mg via ORAL
  Filled 2011-11-18 (×17): qty 1

## 2011-11-18 MED ORDER — HYDROCODONE-ACETAMINOPHEN 5-325 MG PO TABS
ORAL_TABLET | ORAL | Status: AC
  Filled 2011-11-18: qty 2

## 2011-11-18 NOTE — Progress Notes (Signed)
[name]  Subjective: Chart reviewed. Complains of pain in the entire right upper extremity, right side of neck and right anterior chest at the site of the dialysis catheter. Denies dyspnea. He indicates that leg swellings are better. An episode of nausea and vomiting yesterday. Nauseous today but no vomiting.  Objective: Vital signs in last 24 hours: Temp:  [97.2 F (36.2 C)-98.4 F (36.9 C)] 98.1 F (36.7 C) (03/16 1700) Pulse Rate:  [92-97] 93  (03/16 1700) Resp:  [18-20] 18  (03/16 1700) BP: (131-174)/(72-86) 174/80 mmHg (03/16 1700) SpO2:  [93 %-98 %] 93 % (03/16 1700) Weight:  [62.6 kg (138 lb 0.1 oz)] 62.6 kg (138 lb 0.1 oz) (03/15 2324) Weight change:  Last BM Date: 11/15/11  Intake/Output from previous day: 03/15 0701 - 03/16 0700 In: 490 [I.V.:380; IV Piggyback:110] Out: 2200 [Urine:950] Intake/Output this shift: Total I/O In: 240 [P.O.:240] Out: 450 [Urine:450]  General appearance: Sitting up in bed and eating dinner and in mild intermittent painful distress. Resp: clear to auscultation bilaterally. No increased work of breathing. Mild tenderness around the dialysis catheter site but no other acute findings. Cardio: regular rate and rhythm, S1, S2 normal, no murmur, click, rub or gallop. No JVD. GI: soft, non-tender; bowel sounds normal; no masses,  no organomegaly Extremities: extremities normal, atraumatic, no cyanosis and 1+ bilateral lower extremity pitting edema Neurologic: Grossly normal. Alert and oriented. No focal neurological deficits.  Lab Results:  Basename 11/17/11 0430 11/16/11 0455  WBC 7.1 6.4  HGB 10.0* 8.9*  HCT 29.7* 26.5*  PLT 170 158   BMET  Basename 11/17/11 0430 11/16/11 0455  NA 135 138  K 4.3 4.3  CL 104 109  CO2 18* 20  GLUCOSE 74 109*  BUN 56* 55*  CREATININE 7.19* 6.84*  CALCIUM 8.7 8.4    Studies/Results: No results found.  Medications:  I have reviewed the patient's current medications. Scheduled:     . calcium carbonate  1 tablet Oral TID WC  . darbepoetin (ARANESP) injection - DIALYSIS  100 mcg Intravenous Q Thu-HD  . diltiazem  240 mg Oral Daily  . ferric gluconate (FERRLECIT/NULECIT) IV  125 mg Intravenous Q T,Th,Sa-HD  . hydrALAZINE  50 mg Oral BID  . insulin aspart  0-5 Units Subcutaneous QHS  . insulin aspart  0-9 Units Subcutaneous TID WC  . insulin glargine  6 Units Subcutaneous QHS  . levothyroxine  50 mcg Oral Q breakfast  . multivitamin  1 tablet Oral QHS  . pantoprazole  40 mg Oral BID  . sodium chloride  3 mL Intravenous Q12H  . sodium chloride  3 mL Intravenous Q12H  . DISCONTD: insulin aspart  0-9 Units Subcutaneous Q4H  . DISCONTD: pantoprazole (PROTONIX) IV  40 mg Intravenous Q12H    Assessment/Plan: 1. 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 Patient is due for hemodialysis today. According to nephrology patient will need several more hemodialysis treatments prior to discharge. She is being dialyzed with a temporary hemodialysis catheter on the right. AV fistula placed by vascular surgery. Nephrology to decide regarding discontinuing Lasix. 2. Anemia: Stable/improved. 3. Right upper extremity/neck and anterior chest pain: Possibly is secondary to AV fistula creation and dialysis catheter but no obvious complicating features. Patient has good peripheral pulses in the right upper extremity. Normal color too. 4. Pleural and pericardial effusions, probably uremic in origin: Continue hemodialysis per nephrology. 5. Type 1 diabetes mellitus with intermittent hypoglycemic episodes. CBGs ranging  between 67-80 mg/dL. Discontinue bedtime sliding scale and Lantus and monitor closely. 6. Hypertension: Continue Cardizem CD, hydralazine 7. Abdominal pain: Resolved 8. Hypothyroidism: Continue Synthroid   LOS: 3 days   [name] [name] 11/18/2011, 6:02 PM

## 2011-11-18 NOTE — Progress Notes (Addendum)
VASCULAR AND VEIN SURGERY PROGRESS NOTE  Progress note  Date of Surgery: 11/15/2011 - 11/17/2011 Surgeon: Juliann Mule): Rosetta Posner, MD 30 Day Post-Op Right Procedure(s): ARTERIOVENOUS (AV) FISTULA CREATION   HPI: [name] [name] is a 31 y.o. female ESRD   Significant Diagnostic Studies: CBC    Component Value Date/Time   WBC 7.1 11/17/2011 0430   RBC 3.28* 11/17/2011 0430   HGB 10.0* 11/17/2011 0430   HCT 29.7* 11/17/2011 0430   PLT 170 11/17/2011 0430   MCV 90.5 11/17/2011 0430   MCH 30.5 11/17/2011 0430   MCHC 33.7 11/17/2011 0430   RDW 14.3 11/17/2011 0430   LYMPHSABS 0.8 11/15/2011 1552   MONOABS 0.2 11/15/2011 1552   EOSABS 0.0 11/15/2011 1552   BASOSABS 0.0 11/15/2011 1552    BMET    Component Value Date/Time   NA 135 11/17/2011 0430   K 4.3 11/17/2011 0430   CL 104 11/17/2011 0430   CO2 18* 11/17/2011 0430   GLUCOSE 74 11/17/2011 0430   BUN 56* 11/17/2011 0430   CREATININE 7.19* 11/17/2011 0430   CALCIUM 8.7 11/17/2011 0430   GFRNONAA 7* 11/17/2011 0430   GFRAA 8* 11/17/2011 0430    COAG Lab Results  Component Value Date   INR 1.25 11/16/2011   INR 1.23 11/10/2011   No results found for this basename: PTT     I/O last 3 completed shifts: In: 570 [I.V.:460; IV Piggyback:110] Out: 3800 [Urine:2550; Other:1250]  Physical Examination  Patient Vitals for the past 24 hrs:  BP Temp Temp src Pulse Resp SpO2 Weight  11/18/11 0552 132/76 mmHg 97.8 F (36.6 C) Oral 95  20  97 % -  11/18/11 0343 154/85 mmHg 98.4 F (36.9 C) Oral 96  19  95 % -  11/17/11 2324 171/86 mmHg 97.6 F (36.4 C) Oral 97  20  95 % 62.6 kg (138 lb 0.1 oz)  11/17/11 2100 150/83 mmHg 98 F (36.7 C) Oral 94  20  94 % -  11/17/11 1630 169/92 mmHg - - 92  - 97 % -  11/17/11 1600 164/90 mmHg - - 92  - 97 % -  11/17/11 1530 164/91 mmHg - - 92  - 94 % -  11/17/11 1500 154/84 mmHg 97.9 F (36.6 C) Oral 91  - 96 % -  11/17/11 1445 - 98.3 F (36.8 C) - - - - -  11/17/11 1345 143/85 mmHg 98.1 F (36.7 C) - -  - 92 % -  11/17/11 1030 164/85 mmHg 97.8 F (36.6 C) Oral 97  - 93 % -  11/17/11 0937 179/93 mmHg 97.8 F (36.6 C) Oral 100  20  93 % 64.6 kg (142 lb 6.7 oz)  11/17/11 0930 168/90 mmHg - - 96  17  - -  11/17/11 0900 168/91 mmHg - - 97  18  - -  11/17/11 0830 179/95 mmHg - - 97  21  - -    Pt Incision is clean, dry, intact or healing well, skin color is normal, no cyanosis, jaundice, pallor or bruising, normal Pain in the upper arm, distally palp radial pulse intact.  Assessment/Plan  [name] [name] is a 31 y.o. year old female who is S/P Procedure(s):  ARTERIOVENOUS (AV) FISTULA CREATION  F/U with Dr. Donnetta Hutching in 4 weeks.  Laurence Slate [facility] 11/18/2011 8:18 AM   I have examined the patient, reviewed and agree with above. Moderate pain in right antecubital incision. He does have a 2+ radial pulse. She has an  excellent thrill and her upper arm cephalic vein fistula. I will see her in one month in the office for followup. Please call if we can assist  Jeanetta Alonzo, MD 11/18/2011 9:47 AM

## 2011-11-18 NOTE — Progress Notes (Signed)
S: CO pain in Rt from fistula placement O:BP 132/76  Pulse 95  Temp(Src) 97.8 F (36.6 C) (Oral)  Resp 20  Ht 5\' 2"  (1.575 m)  Wt 62.6 kg (138 lb 0.1 oz)  BMI 25.24 kg/m2  SpO2 97%  LMP 11/03/2011  Intake/Output Summary (Last 24 hours) at 11/18/11 0746 Last data filed at 11/17/11 2200  Gross per 24 hour  Intake    490 ml  Output   2200 ml  Net  -1710 ml   Weight change:  [id number] and alert CVS:RRR no rub Resp:decreased BS in bases Abd:+ bs NTND Ext:1+ EDEMA  Rt AVF +bruit NEURO:CNI Ox3  No asterixis      . calcium carbonate  1 tablet Oral TID WC  .  ceFAZolin (ANCEF) IV  1 g Intravenous On Call  . darbepoetin (ARANESP) injection - DIALYSIS  100 mcg Intravenous Q Thu-HD  . diltiazem  240 mg Oral Daily  . ferric gluconate (FERRLECIT/NULECIT) IV  125 mg Intravenous Q T,Th,Sa-HD  . hydrALAZINE  50 mg Oral BID  . insulin aspart  0-5 Units Subcutaneous QHS  . insulin aspart  0-9 Units Subcutaneous TID WC  . insulin glargine  6 Units Subcutaneous QHS  . levothyroxine  50 mcg Oral Q breakfast  . multivitamin  1 tablet Oral QHS  . pantoprazole (PROTONIX) IV  40 mg Intravenous Q12H  . sodium chloride  3 mL Intravenous Q12H  . sodium chloride  3 mL Intravenous Q12H  . DISCONTD: furosemide  160 mg Intravenous BID  . DISCONTD: insulin aspart  0-9 Units Subcutaneous Q4H  . DISCONTD: insulin glargine  12 Units Subcutaneous QHS   Ir Fluoro Guide Cv Line Right  11/16/2011  *RADIOLOGY REPORT*  Clinical Data: Acute renal failure requiring hemodialysis.  TUNNELED CENTRAL VENOUS HEMODIALYSIS CATHETER PLACEMENT WITH ULTRASOUND AND FLUOROSCOPIC GUIDANCE  Sedation:  1.5 mg IV Versed; 75 mcg IV Fentanyl.  Total Moderate Sedation Time:  30 minutes.  Additional Medications:  1 gram IV Ancef.  As antibiotic prophylaxis, Ancef 1 gm was ordered pre-procedure and administered intravenously within one hour of incision.  Fluoroscopy Time:  0.7 minutes.  Procedure:  The procedure, risks, benefits,  and alternatives were explained to the patient.  Questions regarding the procedure were encouraged and answered.  The patient understands and consents to the procedure.  The right neck and chest were prepped with chlorhexidine in a sterile fashion, and a sterile drape was applied covering the operative field.  Maximum barrier sterile technique with sterile gowns and gloves were used for the procedure.  Local anesthesia was provided with 1% lidocaine.  After creating a small venotomy incision, a 21 gauge needle was advanced into the right internal jugular vein under direct, real- time ultrasound guidance.  Ultrasound image documentation was performed.  After securing guidewire access, an 8 Fr dilator was placed.  A J-wire was kinked to measure appropriate catheter length.  A Bard HemoSplit tunneled hemodialysis catheter measuring 19 cm from tip to cuff was chosen for placement.  This was tunneled in a retrograde fashion from the chest wall to the venotomy incision.  At the venotomy, serial dilatation was performed and a 16 Fr peel- away sheath was placed over a guidewire.  The catheter was then placed through the sheath and the sheath removed.  Final catheter positioning was confirmed and documented with a fluoroscopic spot image.  The catheter was aspirated, flushed with saline, and injected with appropriate volume heparin dwells.  The venotomy incision was closed  with subcutaneous 3-0 Monocryl and subcuticular 4-0 Vicryl.  Dermabond was applied to the incision. The catheter exit site was secured with 0-Prolene retention sutures.  Complications: None.  No pneumothorax.  Findings:  After catheter placement, the tips lie in the right atrium.  The catheter aspirates normally and is ready for immediate use.  IMPRESSION:  Placement of tunneled hemodialysis catheter via the right internal jugular vein.  The catheter tips lie in the right atrium.  The catheter is ready for immediate use.  Original Report Authenticated By:  Azzie Roup, M.D.   Ir US Guide Vasc Access Right  11/16/2011  *RADIOLOGY REPORT*  Clinical Data: Acute renal failure requiring hemodialysis.  TUNNELED CENTRAL VENOUS HEMODIALYSIS CATHETER PLACEMENT WITH ULTRASOUND AND FLUOROSCOPIC GUIDANCE  Sedation:  1.5 mg IV Versed; 75 mcg IV Fentanyl.  Total Moderate Sedation Time:  30 minutes.  Additional Medications:  1 gram IV Ancef.  As antibiotic prophylaxis, Ancef 1 gm was ordered pre-procedure and administered intravenously within one hour of incision.  Fluoroscopy Time:  0.7 minutes.  Procedure:  The procedure, risks, benefits, and alternatives were explained to the patient.  Questions regarding the procedure were encouraged and answered.  The patient understands and consents to the procedure.  The right neck and chest were prepped with chlorhexidine in a sterile fashion, and a sterile drape was applied covering the operative field.  Maximum barrier sterile technique with sterile gowns and gloves were used for the procedure.  Local anesthesia was provided with 1% lidocaine.  After creating a small venotomy incision, a 21 gauge needle was advanced into the right internal jugular vein under direct, real- time ultrasound guidance.  Ultrasound image documentation was performed.  After securing guidewire access, an 8 Fr dilator was placed.  A J-wire was kinked to measure appropriate catheter length.  A Bard HemoSplit tunneled hemodialysis catheter measuring 19 cm from tip to cuff was chosen for placement.  This was tunneled in a retrograde fashion from the chest wall to the venotomy incision.  At the venotomy, serial dilatation was performed and a 16 Fr peel- away sheath was placed over a guidewire.  The catheter was then placed through the sheath and the sheath removed.  Final catheter positioning was confirmed and documented with a fluoroscopic spot image.  The catheter was aspirated, flushed with saline, and injected with appropriate volume heparin dwells.  The  venotomy incision was closed with subcutaneous 3-0 Monocryl and subcuticular 4-0 Vicryl.  Dermabond was applied to the incision. The catheter exit site was secured with 0-Prolene retention sutures.  Complications: None.  No pneumothorax.  Findings:  After catheter placement, the tips lie in the right atrium.  The catheter aspirates normally and is ready for immediate use.  IMPRESSION:  Placement of tunneled hemodialysis catheter via the right internal jugular vein.  The catheter tips lie in the right atrium.  The catheter is ready for immediate use.  Original Report Authenticated By: Azzie Roup, M.D.   BMET    Component Value Date/Time   NA 135 11/17/2011 0430   K 4.3 11/17/2011 0430   CL 104 11/17/2011 0430   CO2 18* 11/17/2011 0430   GLUCOSE 74 11/17/2011 0430   BUN 56* 11/17/2011 0430   CREATININE 7.19* 11/17/2011 0430   CALCIUM 8.7 11/17/2011 0430   GFRNONAA 7* 11/17/2011 0430   GFRAA 8* 11/17/2011 0430   CBC    Component Value Date/Time   WBC 7.1 11/17/2011 0430   RBC 3.28* 11/17/2011 0430  HGB 10.0* 11/17/2011 0430   HCT 29.7* 11/17/2011 0430   PLT 170 11/17/2011 0430   MCV 90.5 11/17/2011 0430   MCH 30.5 11/17/2011 0430   MCHC 33.7 11/17/2011 0430   RDW 14.3 11/17/2011 0430   LYMPHSABS 0.8 11/15/2011 1552   MONOABS 0.2 11/15/2011 1552   EOSABS 0.0 11/15/2011 1552   BASOSABS 0.0 11/15/2011 1552     Assessment:  1. ESRD sec DM with uremic sxs 2. Anemia 3. ABD pain, resolved  prob uremic in prigin 4. Pleural/pericardial effusions, uremic in origin   Plan: 1.HD today 2/ Pain meds 3.  Will need several more HD txs before DC 4. Working on outpt spot 5. Will need FU echo before DC to FU effusion  Lujean Ebright T

## 2011-11-18 NOTE — Progress Notes (Signed)
Patient ID: [name] [name], female   DOB: 03-06-1981, 31 y.o.   MRN: [id number] Subjective:  Dyspnea improved   Objective:  Vital Signs in the last 24 hours: Temp:  [97.6 F (36.4 C)-98.4 F (36.9 C)] 97.8 F (36.6 C) (03/16 0552) Pulse Rate:  [91-100] 95  (03/16 0552) Resp:  [17-21] 20  (03/16 0552) BP: (132-179)/(76-95) 132/76 mmHg (03/16 0552) SpO2:  [92 %-97 %] 97 % (03/16 0552) Weight:  [62.6 kg (138 lb 0.1 oz)-64.6 kg (142 lb 6.7 oz)] 62.6 kg (138 lb 0.1 oz) (03/15 2324)  Intake/Output from previous day: 03/15 0701 - 03/16 0700 In: 490 [I.V.:380; IV Piggyback:110] Out: 2200 [Urine:950] Intake/Output from this shift:    Physical Exam: Well appearing young woman, NAD HEENT: Unremarkable Neck:  No JVD, no thyromegally Lymphatics:  No adenopathy Back:  No CVA tenderness Lungs:  Clear with no wheezes. HEART:  Regular rate rhythm, no murmurs, no rubs, no clicks Abd:  Flat, positive bowel sounds, no organomegally, no rebound, no guarding Ext:  2 plus pulses, trace edema, no cyanosis, no clubbing Skin:  No rashes no nodules Neuro:  CN II through XII intact, motor grossly intact  Lab Results:  Basename 11/17/11 0430 11/16/11 0455  WBC 7.1 6.4  HGB 10.0* 8.9*  PLT 170 158    Basename 11/17/11 0430 11/16/11 0455  NA 135 138  K 4.3 4.3  CL 104 109  CO2 18* 20  GLUCOSE 74 109*  BUN 56* 55*  CREATININE 7.19* 6.84*   No results found for this basename: TROPONINI:2,CK,MB:2 in the last 72 hours Hepatic Function Panel  Basename 11/16/11 0455  PROT 5.3*  ALBUMIN 2.3*  AST 7  ALT 8  ALKPHOS 60  BILITOT 0.2*  BILIDIR --  IBILI --   No results found for this basename: CHOL in the last 72 hours No results found for this basename: PROTIME in the last 72 hours  Imaging: Ir Fluoro Guide Cv Line Right  11/16/2011  *RADIOLOGY REPORT*  Clinical Data: Acute renal failure requiring hemodialysis.  TUNNELED CENTRAL VENOUS HEMODIALYSIS CATHETER PLACEMENT WITH ULTRASOUND AND  FLUOROSCOPIC GUIDANCE  Sedation:  1.5 mg IV Versed; 75 mcg IV Fentanyl.  Total Moderate Sedation Time:  30 minutes.  Additional Medications:  1 gram IV Ancef.  As antibiotic prophylaxis, Ancef 1 gm was ordered pre-procedure and administered intravenously within one hour of incision.  Fluoroscopy Time:  0.7 minutes.  Procedure:  The procedure, risks, benefits, and alternatives were explained to the patient.  Questions regarding the procedure were encouraged and answered.  The patient understands and consents to the procedure.  The right neck and chest were prepped with chlorhexidine in a sterile fashion, and a sterile drape was applied covering the operative field.  Maximum barrier sterile technique with sterile gowns and gloves were used for the procedure.  Local anesthesia was provided with 1% lidocaine.  After creating a small venotomy incision, a 21 gauge needle was advanced into the right internal jugular vein under direct, real- time ultrasound guidance.  Ultrasound image documentation was performed.  After securing guidewire access, an 8 Fr dilator was placed.  A J-wire was kinked to measure appropriate catheter length.  A Bard HemoSplit tunneled hemodialysis catheter measuring 19 cm from tip to cuff was chosen for placement.  This was tunneled in a retrograde fashion from the chest wall to the venotomy incision.  At the venotomy, serial dilatation was performed and a 16 Fr peel- away sheath was placed over a guidewire.  The catheter was then placed through the sheath and the sheath removed.  Final catheter positioning was confirmed and documented with a fluoroscopic spot image.  The catheter was aspirated, flushed with saline, and injected with appropriate volume heparin dwells.  The venotomy incision was closed with subcutaneous 3-0 Monocryl and subcuticular 4-0 Vicryl.  Dermabond was applied to the incision. The catheter exit site was secured with 0-Prolene retention sutures.  Complications: None.  No  pneumothorax.  Findings:  After catheter placement, the tips lie in the right atrium.  The catheter aspirates normally and is ready for immediate use.  IMPRESSION:  Placement of tunneled hemodialysis catheter via the right internal jugular vein.  The catheter tips lie in the right atrium.  The catheter is ready for immediate use.  Original Report Authenticated By: Azzie Roup, M.D.   Ir US Guide Vasc Access Right  11/16/2011  *RADIOLOGY REPORT*  Clinical Data: Acute renal failure requiring hemodialysis.  TUNNELED CENTRAL VENOUS HEMODIALYSIS CATHETER PLACEMENT WITH ULTRASOUND AND FLUOROSCOPIC GUIDANCE  Sedation:  1.5 mg IV Versed; 75 mcg IV Fentanyl.  Total Moderate Sedation Time:  30 minutes.  Additional Medications:  1 gram IV Ancef.  As antibiotic prophylaxis, Ancef 1 gm was ordered pre-procedure and administered intravenously within one hour of incision.  Fluoroscopy Time:  0.7 minutes.  Procedure:  The procedure, risks, benefits, and alternatives were explained to the patient.  Questions regarding the procedure were encouraged and answered.  The patient understands and consents to the procedure.  The right neck and chest were prepped with chlorhexidine in a sterile fashion, and a sterile drape was applied covering the operative field.  Maximum barrier sterile technique with sterile gowns and gloves were used for the procedure.  Local anesthesia was provided with 1% lidocaine.  After creating a small venotomy incision, a 21 gauge needle was advanced into the right internal jugular vein under direct, real- time ultrasound guidance.  Ultrasound image documentation was performed.  After securing guidewire access, an 8 Fr dilator was placed.  A J-wire was kinked to measure appropriate catheter length.  A Bard HemoSplit tunneled hemodialysis catheter measuring 19 cm from tip to cuff was chosen for placement.  This was tunneled in a retrograde fashion from the chest wall to the venotomy incision.  At the  venotomy, serial dilatation was performed and a 16 Fr peel- away sheath was placed over a guidewire.  The catheter was then placed through the sheath and the sheath removed.  Final catheter positioning was confirmed and documented with a fluoroscopic spot image.  The catheter was aspirated, flushed with saline, and injected with appropriate volume heparin dwells.  The venotomy incision was closed with subcutaneous 3-0 Monocryl and subcuticular 4-0 Vicryl.  Dermabond was applied to the incision. The catheter exit site was secured with 0-Prolene retention sutures.  Complications: None.  No pneumothorax.  Findings:  After catheter placement, the tips lie in the right atrium.  The catheter aspirates normally and is ready for immediate use.  IMPRESSION:  Placement of tunneled hemodialysis catheter via the right internal jugular vein.  The catheter tips lie in the right atrium.  The catheter is ready for immediate use.  Original Report Authenticated By: Azzie Roup, M.D.    Cardiac Studies: 2 D echo - reviewed Assessment/Plan:  1. Volume overload 2. ESRD 3. Pericardial effusion 4. New onset HD REC: Her effusion should resolve once she has a few additional dialysis sessions. I discussed the importance of a low sodium diet.  Please call for additional questions. Will plan to repeat limited echo prior to discharge as per Dr. Percival Spanish.  LOS: 3 days    Cristopher Peru 11/18/2011, 8:27 AM

## 2011-11-19 ENCOUNTER — Inpatient Hospital Stay (HOSPITAL_COMMUNITY): Payer: Medicaid Other

## 2011-11-19 LAB — RENAL FUNCTION PANEL
Albumin: 2.2 g/dL — ABNORMAL LOW (ref 3.5–5.2)
Calcium: 8.6 mg/dL (ref 8.4–10.5)
Creatinine, Ser: 5.17 mg/dL — ABNORMAL HIGH (ref 0.50–1.10)
GFR calc Af Amer: 12 mL/min — ABNORMAL LOW (ref >90)
GFR calc non Af Amer: 10 mL/min — ABNORMAL LOW (ref >90)
Phosphorus: 5.3 mg/dL — ABNORMAL HIGH (ref 2.3–4.6)
Sodium: 135 mEq/L (ref 135–145)

## 2011-11-19 LAB — GLUCOSE, CAPILLARY
Glucose-Capillary: 160 mg/dL — ABNORMAL HIGH (ref 70–99)
Glucose-Capillary: 179 mg/dL — ABNORMAL HIGH (ref 70–99)
Glucose-Capillary: 124 mg/dL — ABNORMAL HIGH (ref 70–99)

## 2011-11-19 MED ORDER — OXYCODONE HCL 5 MG PO TABS
ORAL_TABLET | ORAL | Status: AC
  Filled 2011-11-19: qty 1

## 2011-11-19 NOTE — Progress Notes (Signed)
[name]  Subjective: Patient indicates that her pain in the right upper extremity and anterior chest are slightly better. Vomited x1 after dialysis yesterday. Mild dyspnea.  Objective: Vital signs in last 24 hours: Temp:  [97.6 F (36.4 C)-98.5 F (36.9 C)] 97.8 F (36.6 C) (03/17 1100) Pulse Rate:  [93-111] 103  (03/17 1100) Resp:  [16-26] 19  (03/17 1100) BP: (150-186)/(73-102) 150/73 mmHg (03/17 1100) SpO2:  [92 %-100 %] 93 % (03/17 1100) Weight:  [59.6 kg (131 lb 6.3 oz)-62.3 kg (137 lb 5.6 oz)] 59.6 kg (131 lb 6.3 oz) (03/16 2111) Weight change: -3.8 kg (-8 lb 6 oz) Last BM Date: 11/18/11  Intake/Output from previous day: 03/16 0701 - 03/17 0700 In: 1200 [P.O.:1200] Out: 3050 [Urine:450] Intake/Output this shift:    General appearance: Sitting up in bed and comfortable. Resp: Clear to auscultation anteriorly but reduced breath sounds in the bases/posteriorly. No increased work of breathing. Mild tenderness around the dialysis catheter site but no other acute findings. Cardio: regular rate and rhythm, S1, S2 normal, no murmur, click, rub or gallop. No JVD. Non-telemetry. GI: soft, non-tender; bowel sounds normal; no masses,  no organomegaly Extremities: extremities normal, atraumatic, no cyanosis and 1+ bilateral lower extremity pitting edema Neurologic: Grossly normal. Alert and oriented. No focal neurological deficits.  Lab Results:  Basename 11/18/11 1941 11/17/11 0430  WBC 8.7 7.1  HGB 9.8* 10.0*  HCT 29.6* 29.7*  PLT 172 170   BMET  Basename 11/18/11 1941 11/17/11 0430  NA 137 135  K 4.4 4.3  CL 102 104  CO2 23 18*  GLUCOSE 102* 74  BUN 41* 56*  CREATININE 6.36* 7.19*  CALCIUM 8.5 8.7    Studies/Results: No results found.  Medications:  I have reviewed the patient's current medications. Scheduled:    . calcium carbonate  1 tablet Oral TID WC  . darbepoetin (ARANESP) injection - DIALYSIS  100 mcg Intravenous Q Thu-HD  . diltiazem   240 mg Oral Daily  . ferric gluconate (FERRLECIT/NULECIT) IV  125 mg Intravenous Q T,Th,Sa-HD  . hydrALAZINE  50 mg Oral BID  . insulin aspart  0-9 Units Subcutaneous TID WC  . levothyroxine  50 mcg Oral Q breakfast  . multivitamin  1 tablet Oral QHS  . pantoprazole  40 mg Oral BID  . sodium chloride  3 mL Intravenous Q12H  . sodium chloride  3 mL Intravenous Q12H  . DISCONTD: insulin aspart  0-5 Units Subcutaneous QHS  . DISCONTD: insulin glargine  6 Units Subcutaneous QHS    Assessment/Plan: 1. 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 Patient is due for hemodialysis today. According to nephrology patient will need several more hemodialysis treatments prior to discharge. She is being dialyzed with a temporary hemodialysis catheter on the right. AV fistula placed by vascular surgery. Nephrology to decide regarding discontinuing Lasix. 2. Anemia: Stable. 3. Right upper extremity/neck and anterior chest pain: Possibly is secondary to AV fistula creation and dialysis catheter but no obvious complicating features. Patient has good peripheral pulses in the right upper extremity. Normal color too. 4. Pleural and pericardial effusions, probably uremic in origin: Continue hemodialysis per nephrology. Cardiology is following. Patient will need a repeat echo prior to discharge. 5. Type 1 diabetes mellitus with intermittent hypoglycemic episodes. CBGs ranging between 160-179 mg/dL. Discontinued bedtime sliding scale and Lantus on 3/16 and no further hypoglycemic episodes. 6. Hypertension: Continue Cardizem CD, hydralazine 7. Abdominal pain: Resolved 8. Hypothyroidism:  Continue Synthroid  Disposition: Not medically ready for discharge.    LOS: 4 days   [name] [name] 11/19/2011, 3:03 PM

## 2011-11-19 NOTE — Progress Notes (Signed)
Patient ID: [name] [name], female   DOB: 1981/03/23, 31 y.o.   MRN: [id number]  Subjective:  Dyspnea improved. Notes nausea with eating.   Objective:  Vital Signs in the last 24 hours: Temp:  [97.2 F (36.2 C)-98.5 F (36.9 C)] 98.5 F (36.9 C) (03/17 0300) Pulse Rate:  [92-111] 104  (03/17 0300) Resp:  [16-26] 18  (03/17 0300) BP: (131-186)/(72-102) 157/74 mmHg (03/17 0300) SpO2:  [92 %-100 %] 95 % (03/17 0300) Weight:  [59.6 kg (131 lb 6.3 oz)-62.3 kg (137 lb 5.6 oz)] 59.6 kg (131 lb 6.3 oz) (03/16 2111)  Intake/Output from previous day: 03/16 0701 - 03/17 0700 In: 1200 [P.O.:1200] Out: 3050 [Urine:450] Intake/Output from this shift:    Physical Exam: Well appearing young woman, NAD HEENT: Unremarkable Neck:  No JVD, no thyromegally Lymphatics:  No adenopathy Back:  No CVA tenderness Lungs:  Clear with no wheezes. HEART:  Regular rate rhythm, no murmurs, no rubs, no clicks Abd:  Flat, positive bowel sounds, no organomegally, no rebound, no guarding Ext:  2 plus pulses, trace edema, no cyanosis, no clubbing Skin:  No rashes no nodules Neuro:  CN II through XII intact, motor grossly intact  Lab Results:  Basename 11/18/11 1941 11/17/11 0430  WBC 8.7 7.1  HGB 9.8* 10.0*  PLT 172 170    Basename 11/18/11 1941 11/17/11 0430  NA 137 135  K 4.4 4.3  CL 102 104  CO2 23 18*  GLUCOSE 102* 74  BUN 41* 56*  CREATININE 6.36* 7.19*   No results found for this basename: TROPONINI:2,CK,MB:2 in the last 72 hours Hepatic Function Panel  Basename 11/18/11 1941  PROT --  ALBUMIN 2.3*  AST --  ALT --  ALKPHOS --  BILITOT --  BILIDIR --  IBILI --   No results found for this basename: CHOL in the last 72 hours No results found for this basename: PROTIME in the last 72 hours  Imaging: No results found.  Cardiac Studies: 2 D echo - reviewed Assessment/Plan:  1. Volume overload, resolving 2. ESRD Stage 5 ckd 3. Pericardial effusion 4. New onset HD 5. DM REC: Her  effusion should resolve once she has a few additional dialysis sessions. Will plan to repeat limited echo prior to discharge, likely on Tuesday, as per Dr. Percival Spanish.  LOS: 4 days    Cristopher Peru 11/19/2011, 8:46 AM

## 2011-11-19 NOTE — Progress Notes (Signed)
S: CO pain in Rt from fistula placement.  Vomited after HD yest. + SOB O:BP 157/74  Pulse 104  Temp(Src) 98.5 F (36.9 C) (Oral)  Resp 18  Ht 5\' 2"  (1.575 m)  Wt 59.6 kg (131 lb 6.3 oz)  BMI 24.03 kg/m2  SpO2 95%  LMP 11/03/2011  Intake/Output Summary (Last 24 hours) at 11/19/11 1008 Last data filed at 11/19/11 0700  Gross per 24 hour  Intake    960 ml  Output   3050 ml  Net  -2090 ml   Weight change: -3.8 kg (-8 lb 6 oz) [id number] and alert CVS:RRR no rub Resp:decreased BS in bases Abd:+ bs NTND Ext:tr EDEMA  Rt AVF +bruit good radial pulse and RT is warm NEURO:CNI Ox3  No asterixis      . calcium carbonate  1 tablet Oral TID WC  . darbepoetin (ARANESP) injection - DIALYSIS  100 mcg Intravenous Q Thu-HD  . diltiazem  240 mg Oral Daily  . ferric gluconate (FERRLECIT/NULECIT) IV  125 mg Intravenous Q T,Th,Sa-HD  . hydrALAZINE  50 mg Oral BID  . insulin aspart  0-9 Units Subcutaneous TID WC  . levothyroxine  50 mcg Oral Q breakfast  . multivitamin  1 tablet Oral QHS  . pantoprazole  40 mg Oral BID  . sodium chloride  3 mL Intravenous Q12H  . sodium chloride  3 mL Intravenous Q12H  . DISCONTD: insulin aspart  0-5 Units Subcutaneous QHS  . DISCONTD: insulin glargine  6 Units Subcutaneous QHS   No results found. BMET    Component Value Date/Time   NA 137 11/18/2011 1941   K 4.4 11/18/2011 1941   CL 102 11/18/2011 1941   CO2 23 11/18/2011 1941   GLUCOSE 102* 11/18/2011 1941   BUN 41* 11/18/2011 1941   CREATININE 6.36* 11/18/2011 1941   CALCIUM 8.5 11/18/2011 1941   GFRNONAA 8* 11/18/2011 1941   GFRAA 9* 11/18/2011 1941   CBC    Component Value Date/Time   WBC 8.7 11/18/2011 1941   RBC 3.31* 11/18/2011 1941   HGB 9.8* 11/18/2011 1941   HCT 29.6* 11/18/2011 1941   PLT 172 11/18/2011 1941   MCV 89.4 11/18/2011 1941   MCH 29.6 11/18/2011 1941   MCHC 33.1 11/18/2011 1941   RDW 13.9 11/18/2011 1941   LYMPHSABS 0.8 11/15/2011 1552   MONOABS 0.2 11/15/2011 1552   EOSABS 0.0  11/15/2011 1552   BASOSABS 0.0 11/15/2011 1552     Assessment:  1. ESRD sec DM with uremic sxs 2. Anemia 4. Pleural/pericardial effusions, uremic in origin   Plan: 1.HD today and re check labs 2/ Pain meds 3.  Will need several more HD txs before DC 4. Working on outpt spot 5. Will need FU echo before DC to FU effusion  Gerilyn Stargell T

## 2011-11-20 NOTE — Progress Notes (Signed)
Utilization review completed.  

## 2011-11-20 NOTE — Progress Notes (Signed)
[facility] PROGRESS NOTE   Subjective:    Patient reports about continues pain and swelling in her right arm after Catheter placement .The pain is relieved by pain medication. Patient reports improved SOB and chest pain. Denies any abdominal pain or leg swelling but noted cramping.    Objective:    Vital Signs:   Temp:  [97.4 F (36.3 C)-98.6 F (37 C)] 98.4 F (36.9 C) (03/18 0523) Pulse Rate:  [96-103] 103  (03/18 0523) Resp:  [16-22] 18  (03/18 0523) BP: (121-188)/(69-93) 144/71 mmHg (03/18 0523) SpO2:  [91 %-99 %] 97 % (03/18 0523) Weight:  [128 lb 1.4 oz (58.1 kg)-134 lb 7.7 oz (61 kg)] 128 lb 4.9 oz (58.2 kg) (03/18 0100) Last BM Date: 11/19/11  24-hour weight change: Weight change: -2 lb 13.9 oz (-1.3 kg)  Intake/Output:   Intake/Output Summary (Last 24 hours) at 11/20/11 0750 Last data filed at 11/20/11 0100  Gross per 24 hour  Intake    380 ml  Output   3404 ml  Net  -3024 ml      Physical Exam: General: Vital signs reviewed and noted. Well-developed, well-nourished, in no acute distress; alert, appropriate and cooperative throughout examination.  Lungs:  Normal respiratory effort. Clear to auscultation BL without crackles or wheezes.  Chest Clean Cath in place . Tender to palpation.   Heart: RRR. S1 and S2 normal.   Abdomen:  BS normoactive. Soft, Nondistended, non-tender.  No masses or organomegaly.  Extremities: Right arm: mild edema with tenderness on palpation present. No pretibial edema.     Labs:  Basic Metabolic Panel:  Lab [id number] 1911 11/18/11 1941 11/17/11 0430 11/16/11 0455 11/15/11 1552  NA 135 137 135 138 135  K 4.3 4.4 4.3 4.3 5.1  CL 100 102 104 109 105  CO2 25 23 18* 20 17*  GLUCOSE 136* 102* 74 109* 272*  BUN 27* 41* 56* 55* 53*  CREATININE 5.17* 6.36* 7.19* 6.84* 6.63*  CALCIUM 8.6 8.5 8.7 -- --  MG -- -- -- 2.3 --  PHOS 5.3* 6.5* -- -- --    Liver Function Tests:  Lab 11/19/11 1911 11/18/11 1941 11/16/11 0455 11/15/11  1552  AST -- -- 7 9  ALT -- -- 8 10  ALKPHOS -- -- 60 74  BILITOT -- -- 0.2* 0.2*  PROT -- -- 5.3* 6.0  ALBUMIN 2.2* 2.3* 2.3* 2.8*   CBC:  Lab 11/18/11 1941 11/17/11 0430 11/16/11 0455 11/15/11 1552  WBC 8.7 7.1 6.4 7.5  NEUTROABS -- -- -- 6.5  HGB 9.8* 10.0* 8.9* 10.2*  HCT 29.6* 29.7* 26.5* 29.9*  MCV 89.4 90.5 89.5 89.0  PLT 172 170 158 152     CBG:  Lab 11/20/11 0211 11/19/11 2343 11/19/11 1712 11/19/11 1155 11/19/11 0744  GLUCAP 173* 115* 124* 179* 161*    Microbiology: Results for orders placed during the hospital encounter of 11/15/11  SURGICAL PCR SCREEN     Status: Normal   Collection Time   11/16/11  4:54 AM      Component Value Range Status Comment   MRSA, PCR NEGATIVE  NEGATIVE  Final    Staphylococcus aureus NEGATIVE  NEGATIVE  Final        Medications:    Infusions:    . dextrose 10 mL (11/16/11 1113)    Scheduled Medications:    . calcium carbonate  1 tablet Oral TID WC  . darbepoetin (ARANESP) injection - DIALYSIS  100 mcg Intravenous Q Thu-HD  .  diltiazem  240 mg Oral Daily  . ferric gluconate (FERRLECIT/NULECIT) IV  125 mg Intravenous Q T,Th,Sa-HD  . hydrALAZINE  50 mg Oral BID  . insulin aspart  0-9 Units Subcutaneous TID WC  . levothyroxine  50 mcg Oral Q breakfast  . multivitamin  1 tablet Oral QHS  . oxyCODONE      . pantoprazole  40 mg Oral BID  . sodium chloride  3 mL Intravenous Q12H  . sodium chloride  3 mL Intravenous Q12H    PRN Medications: sodium chloride, sodium chloride, sodium chloride, acetaminophen, acetaminophen, albuterol, dextrose, feeding supplement (NEPRO CARB STEADY), guaiFENesin-dextromethorphan, heparin, heparin, HYDROcodone-acetaminophen, lidocaine, lidocaine-prilocaine, metoprolol, morphine, ondansetron (ZOFRAN) IV, oxyCODONE, pentafluoroprop-tetrafluoroeth, sodium chloride   Assessment/ Plan:    Pt is a 31 y.o. yo female with a PMHx of DM, Hypertension, CKD  who was admitted on 11/15/2011. Renal service  is consulted for evaluation and management of ESRD .  Assessment   1. 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  Last HD on 11/19/11,. Will need several HD therapy prior to discharge. Working on outpatient set up for HD 2. Anemia - stable  3. Pleural/pericardial effusion likely secondary to Uremia.Need follow up 2D echo prior to discharge to follow up on HD  4. Hypertension- stable  5. Pain- controlled with pain medication.  5. Tachycardia   Length of Stay: 5 days  Patient history and plan of care reviewed with attending, Dr. Elmarie Shiley   Rosalia Hammers, MD PGYII, Internal Medicine Resident 11/20/2011, 7:50 AM   I have seen and examined this patient and agree with the assessment/plan as outlined above by Paulla Fore MD (PGY2). Plan for HD later today if schedule allows or will do HD tomorrow. Process in underway to get her placed to an out-patient HD unit. Probably will hear back regarding placement tomorrow or Wednesday. Juniel Groene K.,MD 11/20/2011 10:00 AM

## 2011-11-20 NOTE — Progress Notes (Signed)
Subjective: Feels tired, but overall feels much better after initiation of HD.  Objective: Vital signs in last 24 hours: Temp:  [97.9 F (36.6 C)-98.6 F (37 C)] 98 F (36.7 C) (03/18 1400) Pulse Rate:  [96-103] 102  (03/18 1400) Resp:  [16-22] 18  (03/18 1400) BP: (121-188)/(71-93) 153/75 mmHg (03/18 1000) SpO2:  [91 %-99 %] 98 % (03/18 1400) Weight:  [58.1 kg (128 lb 1.4 oz)-61 kg (134 lb 7.7 oz)] 58.2 kg (128 lb 4.9 oz) (03/18 0100) Weight change: -1.3 kg (-2 lb 13.9 oz) Last BM Date: 11/19/11  Intake/Output from previous day: 03/17 0701 - 03/18 0700 In: 380 [P.O.:360; I.V.:20] Out: 3404 [Urine:400] Total I/O In: 480 [P.O.:480] Out: 1 [Stool:1]   Physical Exam: General: Alert, awake, oriented x3, in no acute distress. HEENT: No bruits, no goiter. Heart: Regular rate and rhythm, without murmurs, rubs, gallops. Lungs: Clear to auscultation bilaterally. Abdomen: Soft, nontender, nondistended, positive bowel sounds. Extremities: No clubbing cyanosis or edema with positive pedal pulses. Neuro: Grossly intact, nonfocal.    Lab Results: Basic Metabolic Panel:  Basename 11/19/11 1911 11/18/11 1941  NA 135 137  K 4.3 4.4  CL 100 102  CO2 25 23  GLUCOSE 136* 102*  BUN 27* 41*  CREATININE 5.17* 6.36*  CALCIUM 8.6 8.5  MG -- --  PHOS 5.3* 6.5*   Liver Function Tests:  Basename 11/19/11 1911 11/18/11 1941  AST -- --  ALT -- --  ALKPHOS -- --  BILITOT -- --  PROT -- --  ALBUMIN 2.2* 2.3*   CBC:  Basename 11/18/11 1941  WBC 8.7  NEUTROABS --  HGB 9.8*  HCT 29.6*  MCV 89.4  PLT 172   CBG:  Basename 11/20/11 1151 11/20/11 0739 11/20/11 0211 11/19/11 2343 11/19/11 1712 11/19/11 1155  GLUCAP 143* 168* 173* 115* 124* 179*    Recent Results (from the past 240 hour(s))  SURGICAL PCR SCREEN     Status: Normal   Collection Time   11/16/11  4:54 AM      Component Value Range Status Comment   MRSA, PCR NEGATIVE  NEGATIVE  Final    Staphylococcus aureus  NEGATIVE  NEGATIVE  Final     Studies/Results: No results found.  Medications: Scheduled Meds:   . calcium carbonate  1 tablet Oral TID WC  . darbepoetin (ARANESP) injection - DIALYSIS  100 mcg Intravenous Q Thu-HD  . diltiazem  240 mg Oral Daily  . ferric gluconate (FERRLECIT/NULECIT) IV  125 mg Intravenous Q T,Th,Sa-HD  . hydrALAZINE  50 mg Oral BID  . insulin aspart  0-9 Units Subcutaneous TID WC  . levothyroxine  50 mcg Oral Q breakfast  . multivitamin  1 tablet Oral QHS  . oxyCODONE      . pantoprazole  40 mg Oral BID  . sodium chloride  3 mL Intravenous Q12H  . sodium chloride  3 mL Intravenous Q12H   Continuous Infusions:   . dextrose 10 mL (11/16/11 1113)   PRN Meds:.sodium chloride, sodium chloride, sodium chloride, acetaminophen, acetaminophen, albuterol, dextrose, feeding supplement (NEPRO CARB STEADY), guaiFENesin-dextromethorphan, heparin, heparin, HYDROcodone-acetaminophen, lidocaine, lidocaine-prilocaine, metoprolol, morphine, ondansetron (ZOFRAN) IV, oxyCODONE, pentafluoroprop-tetrafluoroeth, sodium chloride  Assessment/Plan:  Principal Problem:  *CKD (chronic kidney disease) stage 5, GFR less than 15 ml/min Active Problems:  DIABETES MELLITUS, TYPE I, UNCONTROLLED  Hypertension  Pericardial effusion  Fluid overload  Abdominal pain, epigastric  Pleural effusion, bilateral  Anemia in chronic kidney disease (CKD)   #1 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 Fistula has been placed. Is waiting for the clipping process to be completed. Currently dialyzing with a temp HD catheter. Renal following.  #2 Pericardial Effusion: No signs of tamponade. Should be improving with HD, however will need followup ECHO to ascertain. Will order.   LOS: 5 days   [facility] Pager: [phone] 11/20/2011, 3:40 PM

## 2011-11-21 ENCOUNTER — Inpatient Hospital Stay (HOSPITAL_COMMUNITY): Payer: Medicaid Other

## 2011-11-21 ENCOUNTER — Encounter (HOSPITAL_COMMUNITY): Payer: Self-pay | Admitting: Vascular Surgery

## 2011-11-21 DIAGNOSIS — E8779 Other fluid overload: Secondary | ICD-10-CM

## 2011-11-21 DIAGNOSIS — J90 Pleural effusion, not elsewhere classified: Secondary | ICD-10-CM

## 2011-11-21 DIAGNOSIS — I319 Disease of pericardium, unspecified: Secondary | ICD-10-CM

## 2011-11-21 LAB — RENAL FUNCTION PANEL
Albumin: 2.1 g/dL — ABNORMAL LOW (ref 3.5–5.2)
BUN: 22 mg/dL (ref 6–23)
CO2: 28 mEq/L (ref 19–32)
Chloride: 99 mEq/L (ref 96–112)
Glucose, Bld: 172 mg/dL — ABNORMAL HIGH (ref 70–99)
Potassium: 4.3 mEq/L (ref 3.5–5.1)

## 2011-11-21 LAB — CBC
Hemoglobin: 9.9 g/dL — ABNORMAL LOW (ref 12.0–15.0)
MCV: 90 fL (ref 78.0–100.0)
Platelets: 178 10*3/uL (ref 150–400)
RBC: 3.31 MIL/uL — ABNORMAL LOW (ref 3.87–5.11)
WBC: 7.7 10*3/uL (ref 4.0–10.5)

## 2011-11-21 LAB — GLUCOSE, CAPILLARY: Glucose-Capillary: 190 mg/dL — ABNORMAL HIGH (ref 70–99)

## 2011-11-21 MED ORDER — HYDROCODONE-ACETAMINOPHEN 5-325 MG PO TABS
ORAL_TABLET | ORAL | Status: AC
  Administered 2011-11-21: 2 via ORAL
  Filled 2011-11-21: qty 2

## 2011-11-21 NOTE — Progress Notes (Signed)
Subjective: Feels tired, but overall feels much better after initiation of HD. Today has experienced some vomiting after her HD session.  Objective: Vital signs in last 24 hours: Temp:  [97.4 F (36.3 C)-98.1 F (36.7 C)] 97.4 F (36.3 C) (03/19 1230) Pulse Rate:  [97-108] 97  (03/19 1230) Resp:  [14-18] 16  (03/19 1230) BP: (119-176)/(72-93) 147/83 mmHg (03/19 1230) SpO2:  [90 %-99 %] 97 % (03/19 1230) Weight:  [57 kg (125 lb 10.6 oz)-60.2 kg (132 lb 11.5 oz)] 57 kg (125 lb 10.6 oz) (03/19 1126) Weight change: -2.7 kg (-5 lb 15.2 oz) Last BM Date: 11/20/11  Intake/Output from previous day: 03/18 0701 - 03/19 0700 In: 480 [P.O.:480] Out: 401 [Urine:400; Stool:1] Total I/O In: -  Out: 3302 [Urine:300; Other:3002]   Physical Exam: General: Alert, awake, oriented x3, in no acute distress. HEENT: No bruits, no goiter. Heart: Regular rate and rhythm, without murmurs, rubs, gallops. Lungs: Clear to auscultation bilaterally. Abdomen: Soft, nontender, nondistended, positive bowel sounds. Extremities: No clubbing cyanosis or edema with positive pedal pulses. Neuro: Grossly intact, nonfocal.    Lab Results: Basic Metabolic Panel:  Basename 11/21/11 0500 11/19/11 1911  NA 137 135  K 4.3 4.3  CL 99 100  CO2 28 25  GLUCOSE 172* 136*  BUN 22 27*  CREATININE 5.16* 5.17*  CALCIUM 8.7 8.6  MG -- --  PHOS 4.4 5.3*   Liver Function Tests:  Basename 11/21/11 0500 11/19/11 1911  AST -- --  ALT -- --  ALKPHOS -- --  BILITOT -- --  PROT -- --  ALBUMIN 2.1* 2.2*   CBC:  Basename 11/21/11 0500 11/18/11 1941  WBC 7.7 8.7  NEUTROABS -- --  HGB 9.9* 9.8*  HCT 29.8* 29.6*  MCV 90.0 89.4  PLT 178 172   CBG:  Basename 11/21/11 1631 11/21/11 1213 11/20/11 2135 11/20/11 1723 11/20/11 1151 11/20/11 0739  GLUCAP 190* 139* 140* 164* 143* 168*    Recent Results (from the past 240 hour(s))  SURGICAL PCR SCREEN     Status: Normal   Collection Time   11/16/11  4:54 AM   Component Value Range Status Comment   MRSA, PCR NEGATIVE  NEGATIVE  Final    Staphylococcus aureus NEGATIVE  NEGATIVE  Final     Studies/Results: No results found.  Medications: Scheduled Meds:    . calcium carbonate  1 tablet Oral TID WC  . darbepoetin (ARANESP) injection - DIALYSIS  100 mcg Intravenous Q Thu-HD  . diltiazem  240 mg Oral Daily  . ferric gluconate (FERRLECIT/NULECIT) IV  125 mg Intravenous Q T,Th,Sa-HD  . hydrALAZINE  50 mg Oral BID  . insulin aspart  0-9 Units Subcutaneous TID WC  . levothyroxine  50 mcg Oral Q breakfast  . multivitamin  1 tablet Oral QHS  . pantoprazole  40 mg Oral BID  . sodium chloride  3 mL Intravenous Q12H  . sodium chloride  3 mL Intravenous Q12H   Continuous Infusions:    . dextrose 10 mL (11/16/11 1113)   PRN Meds:.sodium chloride, sodium chloride, sodium chloride, acetaminophen, acetaminophen, albuterol, dextrose, feeding supplement (NEPRO CARB STEADY), guaiFENesin-dextromethorphan, heparin, heparin, HYDROcodone-acetaminophen, lidocaine, lidocaine-prilocaine, metoprolol, morphine, ondansetron (ZOFRAN) IV, oxyCODONE, pentafluoroprop-tetrafluoroeth, sodium chloride  Assessment/Plan:  Principal Problem:  *CKD (chronic kidney disease) stage 5, GFR less than 15 ml/min Active Problems:  DIABETES MELLITUS, TYPE I, UNCONTROLLED  Hypertension  Pericardial effusion  Fluid overload  Abdominal pain, epigastric  Pleural effusion, bilateral  Anemia in chronic kidney disease (  CKD)   #1 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 Fistula has been placed. Is waiting for the clipping process to be completed. Currently dialyzing with a temp HD catheter. Renal following. Received HD today 3/19.  #2 Pericardial Effusion: No signs of tamponade. Should be improving with HD. Followup ECHO done, but not read as of yet. Cardiology has signed off.  #3 Dispo: DC home once she has been clipped for OP HD. Permanent access has been placed.   LOS: 6  days   [facility] Pager: [phone] 11/21/2011, 4:56 PM

## 2011-11-21 NOTE — Progress Notes (Signed)
KIDNEY PROGRESS NOTE Resident Note   Subjective:   Patient noted she continues to have pain in her right arm and chest area . The pain is controlled with pain medication but without it the pain is 8-9/10. She also noted some discomfort in her legs. Reports about sever nausea yesterday but otherwise denies chest pain, SOB, abdominal pain.    Objective:    Vital Signs:   Temp:  [97.8 F (36.6 C)-98.2 F (36.8 C)] 97.8 F (36.6 C) (03/19 0520) Pulse Rate:  [98-103] 103  (03/19 0520) Resp:  [18] 18  (03/19 0520) BP: (153-159)/(75-79) 159/77 mmHg (03/19 0520) SpO2:  [92 %-99 %] 92 % (03/19 0520) Weight:  [128 lb 8.5 oz (58.3 kg)] 128 lb 8.5 oz (58.3 kg) (03/18 2138) Last BM Date: 11/19/11  24-hour weight change: Weight change: -5 lb 15.2 oz (-2.7 kg)  Intake/Output:  03/18 0701 - 03/19 0700 In: 480 [P.O.:480] Out: 401 [Urine:400; Stool:1]  Physical Exam: General: Vital signs reviewed and noted. Well-developed, well-nourished, in no acute distress; alert, appropriate and cooperative throughout examination.  Lungs:  Normal respiratory effort. Clear to auscultation BL without crackles or wheezes anterior.   Chest Cath area clean  Heart: RRR. S1 and S2 normal.  Abdomen:  BS normoactive. Soft, Nondistended, non-tender.   Extremities: No pretibial edema. AVG area: clean     Labs: Basic Metabolic Panel:  Lab [id number] 1911 11/18/11 1941 11/17/11 0430 11/16/11 0455 11/15/11 1552  NA 135 137 135 138 135  K 4.3 4.4 4.3 4.3 5.1  CL 100 102 104 109 105  CO2 25 23 18* 20 17*  GLUCOSE 136* 102* 74 109* 272*  BUN 27* 41* 56* 55* 53*  CREATININE 5.17* 6.36* 7.19* 6.84* 6.63*  CALCIUM 8.6 8.5 8.7 -- --  MG -- -- -- 2.3 --  PHOS 5.3* 6.5* -- -- --    Liver Function Tests:  Lab 11/19/11 1911 11/18/11 1941 11/16/11 0455 11/15/11 1552  AST -- -- 7 9  ALT -- -- 8 10  ALKPHOS -- -- 60 74  BILITOT -- -- 0.2* 0.2*  PROT -- -- 5.3* 6.0  ALBUMIN 2.2* 2.3* 2.3* 2.8*    Lab  11/15/11 1552  LIPASE 22  AMYLASE --   No results found for this basename: AMMONIA:3 in the last 168 hours  CBC:  Lab 11/21/11 0500 11/18/11 1941 11/17/11 0430 11/16/11 0455 11/15/11 1552  WBC 7.7 8.7 7.1 6.4 7.5  NEUTROABS -- -- -- -- 6.5  HGB 9.9* 9.8* 10.0* 8.9* 10.2*  HCT 29.8* 29.6* 29.7* 26.5* 29.9*  MCV 90.0 89.4 90.5 89.5 89.0  PLT 178 172 170 158 152     CBG:  Lab 11/20/11 2135 11/20/11 1723 11/20/11 1151 11/20/11 0739 11/20/11 0211  GLUCAP 140* 164* 143* 168* 173*    Microbiology: Results for orders placed during the hospital encounter of 11/15/11  SURGICAL PCR SCREEN     Status: Normal   Collection Time   11/16/11  4:54 AM      Component Value Range Status Comment   MRSA, PCR NEGATIVE  NEGATIVE  Final    Staphylococcus aureus NEGATIVE  NEGATIVE  Final      Medications:    Infusions:    . dextrose 10 mL (11/16/11 1113)    Scheduled Medications:    . calcium carbonate  1 tablet Oral TID WC  . darbepoetin (ARANESP) injection - DIALYSIS  100 mcg Intravenous Q Thu-HD  . diltiazem  240 mg Oral Daily  .  ferric gluconate (FERRLECIT/NULECIT) IV  125 mg Intravenous Q T,Th,Sa-HD  . hydrALAZINE  50 mg Oral BID  . insulin aspart  0-9 Units Subcutaneous TID WC  . levothyroxine  50 mcg Oral Q breakfast  . multivitamin  1 tablet Oral QHS  . oxyCODONE      . pantoprazole  40 mg Oral BID  . sodium chloride  3 mL Intravenous Q12H  . sodium chloride  3 mL Intravenous Q12H    PRN Medications: sodium chloride, sodium chloride, sodium chloride, acetaminophen, acetaminophen, albuterol, dextrose, feeding supplement (NEPRO CARB STEADY), guaiFENesin-dextromethorphan, heparin, heparin, HYDROcodone-acetaminophen, lidocaine, lidocaine-prilocaine, metoprolol, morphine, ondansetron (ZOFRAN) IV, oxyCODONE, pentafluoroprop-tetrafluoroeth, sodium chloride   Assessment/ Plan:    Pt is a 31 y.o. yo female with a PMHx of DM, Hypertension, CKD who was admitted on 11/15/2011. Renal  service is consulted for evaluation and management of ESRD .   1. 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 Receiving HD today. Will need several HD therapy prior to discharge. Working on outpatient set up for HD.  2. Anemia - stable  3. Pleural/pericardial effusion likely secondary to Uremia.Need follow up 2D echo prior to discharge to follow up on effusion  4. Hypertension- Mildly elevated BP. Continue to monitor.  5. Pain- controlled with pain medication.  5. Tachycardia   Length of Stay: 6 days  Patient history and plan of care reviewed with attending, Dr. Elmarie Shiley.   Rosalia Hammers, MD PGYII, Internal Medicine Resident 11/21/2011, 6:52 AM  I have seen and examined this patient and agree with the assessment/plan as outlined above by Newt Lukes MD (PGY2). Plan to continue HD as we pursue out-patient placement. Clinically better other than complaints of Rt arm/shoulder pain and intermittent nausea.  Houston Surges K.,MD 11/21/2011 9:28 AM

## 2011-11-21 NOTE — Progress Notes (Signed)
  Echocardiogram 2D Echocardiogram has been performed.  Alvin Critchley A 11/21/2011, 2:15 PM

## 2011-11-21 NOTE — Progress Notes (Signed)
Patient Name: [name] [name] Date of Encounter: 11/21/2011  Principal Problem:  *CKD (chronic kidney disease) stage 5, GFR less than 15 ml/min Active Problems:  DIABETES MELLITUS, TYPE I, UNCONTROLLED  Hypertension  Pericardial effusion  Fluid overload  Abdominal pain, epigastric  Pleural effusion, bilateral  Anemia in chronic kidney disease (CKD)    SUBJECTIVE: Pt has had 4 HD treatments so far. She is breathing better and her abd is much smaller. She never had much LE edema. She is concerned [id number] with deep inspiration and wanted to know the results of the repeat echo.   OBJECTIVE  Filed Vitals:   11/21/11 1100 11/21/11 1126 11/21/11 1230 11/21/11 1830  BP: 137/72 148/84 147/83 136/78  Pulse: 106 106 97 97  Temp:  98.1 F (36.7 C) 97.4 F (36.3 C) 97.7 F (36.5 C)  TempSrc:  Oral Oral Oral  Resp:  15 16 18   Height:      Weight:  125 lb 10.6 oz (57 kg)    SpO2:  92% 97% 98%    Intake/Output Summary (Last 24 hours) at 11/21/11 1905 Last data filed at 11/21/11 1452  Gross per 24 hour  Intake      0 ml  Output   3702 ml  Net  -3702 ml   Weight change: -5 lb 15.2 oz (-2.7 kg) Wt Readings from Last 3 Encounters:  11/21/11 125 lb 10.6 oz (57 kg)  11/21/11 125 lb 10.6 oz (57 kg)  11/03/11 138 lb (62.596 kg)    PHYSICAL EXAM  General: Well developed, well nourished, slender female in no acute distress. Head: Normocephalic, atraumatic.  Neck: Supple without bruits, JVD elevated approx 10 cm. Lungs:  Resp regular and slightly labored. She has significantly decreased breath sounds both bases with a ?pleural rub anteriorly. Heart: rapid, RRR, S1, S2, no S3, S4, or murmurs. Abdomen: Soft, non-tender, non-distended, BS present.  Extremities: No clubbing, cyanosis, no edema.  Neuro: Alert and oriented X 3. Moves all extremities spontaneously. Psych: Normal affect.  LABS:  CBC: Basename 11/21/11 0500 11/18/11 1941  WBC 7.7 8.7  NEUTROABS -- --  HGB 9.9* 9.8*    HCT 29.8* 29.6*  MCV 90.0 89.4  PLT 178 [id number]   Basic Metabolic Panel: Basename [id number] 0500 11/19/11 1911  NA 137 135  K 4.3 4.3  CL 99 100  CO2 28 25  GLUCOSE 172* 136*  BUN 22 27*  CREATININE 5.16* 5.17*  CALCIUM 8.7 8.6  MG -- --  PHOS 4.4 5.3*   Liver Function Tests: Basename 11/21/11 0500 11/19/11 1911  AST -- --  ALT -- --  ALKPHOS -- --  BILITOT -- --  PROT -- --  ALBUMIN 2.1* 2.2*   TELE: ST    Radiology/Studies: None in > 1 week  Current Medications:    . calcium carbonate  1 tablet Oral TID WC  . darbepoetin (ARANESP) injection - DIALYSIS  100 mcg Intravenous Q Thu-HD  . diltiazem  240 mg Oral Daily  . ferric gluconate (FERRLECIT/NULECIT) IV  125 mg Intravenous Q T,Th,Sa-HD  . hydrALAZINE  50 mg Oral BID  . insulin aspart  0-9 Units Subcutaneous TID WC  . levothyroxine  50 mcg Oral Q breakfast  . multivitamin  1 tablet Oral QHS  . pantoprazole  40 mg Oral BID  . sodium chloride  3 mL Intravenous Q12H  . sodium chloride  3 mL Intravenous Q12H    ASSESSMENT AND PLAN: 1. ESRD on HD - Doing better with  HD, per IM/Renal team  2. Pericardial Effusion - no significant change since 3/14, follow symptoms for now, MD to review all data in am and advise if further treatment/procedures needed. Otherwise, will re-check echo on Monday.  3. Otherwise - per primary MD/Renal team    Signed, Rosaria Ferries , PA-C 7:05 PM 11/21/2011   I have seen, examined the patient, and reviewed the above assessment and plan.  She reports significant improvement in SOB and edema.  Echo reveals large pericardial effusion.  BP is presently stable.   She has received several HD treatments.  She will need close follow-up and serial exams with ongoing volume removal over the next few days. I suspect that her effusion is related to uremia and hopefully with resolve eventually with HD, though presently it has not improved.  Co Sign: Thompson Grayer, MD 11/21/2011 7:37 PM

## 2011-11-21 NOTE — Procedures (Signed)
I was present at this session.  I have reviewed the session itself and made appropriate changes.  Tasharra Nodine K. 3/19/20139:29 AM

## 2011-11-21 NOTE — Progress Notes (Signed)
Clinical Social Work Department BRIEF PSYCHOSOCIAL ASSESSMENT 11/21/2011  Patient:  [name],[name]     Account Number:  [id number]     Admit date:  11/15/2011  Clinical Social Worker:  Frederico Hamman  Date/Time:  11/21/2011 05:23 PM  Referred by:  Physician  Date Referred:   Referred for  Transportation assistance   Other Referral:   Interview type:  Patient Other interview type:    PSYCHOSOCIAL DATA Living Status:  ALONE Admitted from facility:   Level of care:   Primary support name:  UNK Primary support relationship to patient:   Degree of support available:    CURRENT CONCERNS Current Concerns  Other - See comment   Other Concerns:   Transportation to Hemodialysis    SOCIAL WORK ASSESSMENT / PLAN BSW Intern Candy Sledge talked with patient regarding her need for transportation. Patient has been in this country for 4 years and is documented (green card). She lives alone. When asked, she has been seen by the financial counselors who have and the Medicaid application process has be initiated. Patient did acknowledge the need for assistance with transportation and was amendable to completed a SCAT application. Patient was given application to read over and complete and CSW V. Sharlet Salina will complete professional verification form and fax the entire application to the [place] office prior to patients discharge from hospital.   Assessment/plan status:  Psychosocial Support/Ongoing Assessment of Needs Other assessment/ plan:   Information/referral to community resources:   Patient given SCAT application    PATIENT'S/FAMILY'S RESPONSE TO PLAN OF CARE: Patient was appreciative of BSW's assistance with transportation. She was advised that a clinical social worker will follow-up with her regarding the SCAT application.

## 2011-11-21 NOTE — Progress Notes (Signed)
Patient ID: [name] [name], female   DOB: [birth year], 31 y.o.   MRN: [id number] 2-D echo has been reviewed. The patient has a persistent pericardial effusion. This is a bit surprising after undergoing several sessions of hemodialysis. We'll plan to repeat her ECG tomorrow. Paracardial centesis will be considered once a direct comparison of the initial effusion echo with the most recent echo can be made. Cristopher Peru, M.D.

## 2011-11-22 LAB — GLUCOSE, CAPILLARY: Glucose-Capillary: 178 mg/dL — ABNORMAL HIGH (ref 70–99)

## 2011-11-22 NOTE — Progress Notes (Addendum)
During my 0000 rounds, came down the hall to hear the shower running and as I went into the room, the patient was in the shower.  I informed her that she could not take a shower because I did not see an order in the chart.  She stated that she took one earlier and that the previous nurse told her she could just soap herself up and rinse off. I will be contacting the MD about this.    Pt educated on care of temporary catheter and that an actual order is needed for her to take a shower and that in the future she can take a wash up at the sink or bedside.   Will pass this along to the morning nurse address with the MD on call during the morning.

## 2011-11-22 NOTE — Progress Notes (Signed)
[facility]ESS NOTE Resident Note   Please see below for attending addendum to resident note.  Subjective:    Patient continues to have pain in her arm and chest area but it is improving. The swelling has decreased. Patient noted that her SOB has improved. Denies any other chest pain.    Objective:    Vital Signs:   Temp:  [97.4 F (36.3 C)-98.8 F (37.1 C)] 98 F (36.7 C) (03/20 0525) Pulse Rate:  [97-108] 103  (03/20 0525) Resp:  [14-19] 19  (03/20 0525) BP: (119-176)/(72-93) 168/87 mmHg (03/20 0525) SpO2:  [90 %-98 %] 95 % (03/20 0525) Weight:  [125 lb 10.6 oz (57 kg)-132 lb 11.5 oz (60.2 kg)] 125 lb 10.6 oz (57 kg) (03/19 1126) Last BM Date: 11/21/11  24-hour weight change: Weight change: 4 lb 3 oz (1.9 kg)  Weight trends: Filed Weights   11/20/11 2138 11/21/11 0756 11/21/11 1126  Weight: 128 lb 8.5 oz (58.3 kg) 132 lb 11.5 oz (60.2 kg) 125 lb 10.6 oz (57 kg)    Intake/Output:  03/19 0701 - 03/20 0700 In: 120 [P.O.:120] Out: 3452 [Urine:450]  Physical Exam: General: Vital signs reviewed and noted. Well-developed, well-nourished, in no acute distress; alert, appropriate and cooperative throughout examination.  Lungs:  Normal respiratory effort. Clear to auscultation BL without crackles or wheezes. Mild decreased air movement at the bases.   Heart: RRR. S1 and S2 normal without gallop, murmur, or rubs.  Abdomen:  BS normoactive. Soft, Nondistended, non-tender.  No masses or organomegaly.  Extremities: No pretibial edema.     Labs: Basic Metabolic Panel:  Lab [id number] 0500 11/19/11 1911 11/18/11 1941 11/17/11 0430 11/16/11 0455  NA 137 135 137 135 138  K 4.3 4.3 4.4 4.3 4.3  CL 99 100 102 104 109  CO2 28 25 23  18* 20  GLUCOSE 172* 136* 102* 74 109*  BUN 22 27* 41* 56* 55*  CREATININE 5.16* 5.17* 6.36* 7.19* 6.84*  CALCIUM 8.7 8.6 8.5 -- --  MG -- -- -- -- 2.3  PHOS 4.4 5.3* 6.5* -- --    Liver Function Tests:  Lab 11/21/11 0500 11/19/11 1911  11/18/11 1941 11/16/11 0455 11/15/11 1552  AST -- -- -- 7 9  ALT -- -- -- 8 10  ALKPHOS -- -- -- 60 74  BILITOT -- -- -- 0.2* 0.2*  PROT -- -- -- 5.3* 6.0  ALBUMIN 2.1* 2.2* 2.3* 2.3* 2.8*    CBC:  Lab 11/21/11 0500 11/18/11 1941 11/17/11 0430 11/16/11 0455 11/15/11 1552  WBC 7.7 8.7 7.1 6.4 7.5  NEUTROABS -- -- -- -- 6.5  HGB 9.9* 9.8* 10.0* 8.9* 10.2*  HCT 29.8* 29.6* 29.7* 26.5* 29.9*  MCV 90.0 89.4 90.5 89.5 89.0  PLT 178 172 170 158 152      CBG:  Lab 11/21/11 2314 11/21/11 1631 11/21/11 1213 11/20/11 2135 11/20/11 1723  GLUCAP 167* 190* 139* 140* 164*    Microbiology: Results for orders placed during the hospital encounter of 11/15/11  SURGICAL PCR SCREEN     Status: Normal   Collection Time   11/16/11  4:54 AM      Component Value Range Status Comment   MRSA, PCR NEGATIVE  NEGATIVE  Final    Staphylococcus aureus NEGATIVE  NEGATIVE  Final    2D Echo 11/21/11:  Study Conclusions  - Left ventricle: The cavity size was normal. Wall thickness was increased in a pattern of mild LVH. Systolic function was normal. The estimated ejection  fraction was in the range of 55% to 60%. Wall motion was normal; there were no regional wall motion abnormalities. - Right atrium: The atrium was mildly dilated. - Atrial septum: No defect or patent foramen ovale was identified. - Inferior vena cava: The vessel was normal in size; the respirophasic diameter changes were in the normal range (= 50%); findings are consistent with normal central venous pressure. - Pericardium, extracardiac: A free-flowing pericardial effusion was identified circumferential to the heart. There was moderateright ventricular chamber collapse for less than 50% of the cardiac cycle. There was moderateright atrial chamber collapse for more than 50% of the cardiac cycle. There was evidence for moderately increased RV-LV interaction demonstrated by respirophasic changes in transmitral velocities. Features  were consistent with tamponade physiology. Constrictive physiology cannot be excluded, based on an atrial systolic notch in the M-mode tracing of the interventricular septum. There was a large left pleural effusion. - Impressions: Pre- tamponade physiology although IVC not dilated, other echo criteria for tamponade are met as outlined.       Medications:    Infusions:    . dextrose 10 mL (11/16/11 1113)    Scheduled Medications:    . calcium carbonate  1 tablet Oral TID WC  . darbepoetin (ARANESP) injection - DIALYSIS  100 mcg Intravenous Q Thu-HD  . diltiazem  240 mg Oral Daily  . ferric gluconate (FERRLECIT/NULECIT) IV  125 mg Intravenous Q T,Th,Sa-HD  . hydrALAZINE  50 mg Oral BID  . insulin aspart  0-9 Units Subcutaneous TID WC  . levothyroxine  50 mcg Oral Q breakfast  . multivitamin  1 tablet Oral QHS  . pantoprazole  40 mg Oral BID  . sodium chloride  3 mL Intravenous Q12H  . sodium chloride  3 mL Intravenous Q12H    PRN Medications: sodium chloride, sodium chloride, sodium chloride, acetaminophen, acetaminophen, albuterol, dextrose, feeding supplement (NEPRO CARB STEADY), guaiFENesin-dextromethorphan, heparin, heparin, HYDROcodone-acetaminophen, lidocaine, lidocaine-prilocaine, metoprolol, morphine, ondansetron (ZOFRAN) IV, oxyCODONE, pentafluoroprop-tetrafluoroeth, sodium chloride   Assessment/ Plan:    Pt is a 31 y.o. yo female with a PMHx of DM, Hypertension, CKD who was admitted on 11/15/2011. Renal service is consulted for evaluation and management of ESRD .   1. 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 Received  HD yesterday . Will need several HD therapy prior to discharge. Working on outpatient set up for HD.  2. Anemia - Currently on Aranesp and ferric gluconate . Stable  3. Pleural/pericardial effusion likely secondary to Uremia. 2D-Echo showed persistent pericardial effusion despite several session of HD. Possible Paracardial centesis.  Appreciate Cardiology input 4. Hypertension- Mildly elevated BP. Continue to monitor 5. Pain- controlled with pain medication.  5. Tachycardia   Length of Stay: 7 days  Patient history and plan of care reviewed with attending, Dr. Elmarie Shiley.   Durwin Nora, Internal Medicine Resident 11/22/2011, 7:03 AM  I have seen and examined this patient and agree with the assessment/plan as outlined above by Newt Lukes MD (PGY2). Will continue HD on TTS schedule with UF to try and mobilize pericardial effusion. Await decision by cardiology on need for pericardiocentesis (I am concerned that with reduction of intravascular volume/BP- we may see some tamponade physiology due to alter cardiac filling pressures) Plan for HD tomorrow. Vanice Rappa K.,MD 11/22/2011 10:07 AM

## 2011-11-22 NOTE — Progress Notes (Signed)
Patient ID: [name] [name], female   DOB: Jan 25, 1981, 31 y.o.   MRN: [id number] Subjective:  Dialysis access site pain is improved. Still some pleuritic chest pain. No fever or chills.  Objective:  Vital Signs in the last 24 hours: Temp:  [97.9 F (36.6 C)-98.8 F (37.1 C)] 98.6 F (37 C) (03/20 1800) Pulse Rate:  [96-108] 98  (03/20 1800) Resp:  [18-20] 20  (03/20 1800) BP: (148-168)/(75-87) 154/75 mmHg (03/20 1800) SpO2:  [95 %-98 %] 98 % (03/20 1800)  Intake/Output from previous day: 03/19 0701 - 03/20 0700 In: 120 [P.O.:120] Out: 3452 [Urine:450] Intake/Output from this shift:    Physical Exam: Well appearing NAD HEENT: Unremarkable Neck:  No JVD, no thyromegally Lymphatics:  No adenopathy Back:  No CVA tenderness Lungs:  Clear with rales in the left base along with reduced breath sounds. HEART:  Regular rate rhythm, no murmurs, no rubs, no clicks Abd:  Flat, positive bowel sounds, no organomegally, no rebound, no guarding Ext:  2 plus pulses, no edema, no cyanosis, no clubbing Skin:  No rashes no nodules Neuro:  CN II through XII intact, motor grossly intact  Lab Results:  Basename 11/21/11 0500  WBC 7.7  HGB 9.9*  PLT 178    Basename 11/21/11 0500  NA 137  K 4.3  CL 99  CO2 28  GLUCOSE 172*  BUN 22  CREATININE 5.16*   No results found for this basename: TROPONINI:2,CK,MB:2 in the last 72 hours Hepatic Function Panel  Basename 11/21/11 0500  PROT --  ALBUMIN 2.1*  AST --  ALT --  ALKPHOS --  BILITOT --  BILIDIR --  IBILI --   No results found for this basename: CHOL in the last 72 hours No results found for this basename: PROTIME in the last 72 hours  Imaging: No results found.  Cardiac Studies: Tele - NSR Assessment/Plan: 1. Pericardial effusion - her 2 D echo is unchanged in appearance. No worsening or lessening of her effusion. She does not appear to be symptomatic. 2. Pleural effusion - she could undergo thoracentesis which would be both  therapeutic and diagnostic.  LOS: 7 days    Cristopher Peru 11/22/2011, 7:29 PM

## 2011-11-22 NOTE — Progress Notes (Signed)
Subjective: Patient with c/o pain in L upper arm/neck SOB better  Objective: Vital signs in last 24 hours: Filed Vitals:   11/21/11 1830 11/21/11 2315 11/22/11 0525 11/22/11 1000  BP: 136/78 158/80 168/87 155/76  Pulse: 97 108 103 96  Temp: 97.7 F (36.5 C) 98.8 F (37.1 C) 98 F (36.7 C) 97.9 F (36.6 C)  TempSrc: Oral Oral Oral Oral  Resp: 18 18 19 20   Height:      Weight:      SpO2: 98% 95% 95% 96%   Weight change: 1.9 kg (4 lb 3 oz)  Intake/Output Summary (Last 24 hours) at 11/22/11 1321 Last data filed at 11/22/11 1100  Gross per 24 hour  Intake    360 ml  Output    650 ml  Net   -290 ml    Physical Exam: General: Awake, Oriented, No acute distress. HEENT: EOMI. Neck: Supple, catheter in place CV: S1 and S2, rrr Lungs: Clear to ascultation bilaterally, no wheezing Abdomen: Soft, Nontender, Nondistended, +bowel sounds. Ext: Good pulses. Trace edema.   Lab Results:  Basename 11/21/11 0500 11/19/11 1911  NA 137 135  K 4.3 4.3  CL 99 100  CO2 28 25  GLUCOSE 172* 136*  BUN 22 27*  CREATININE 5.16* 5.17*  CALCIUM 8.7 8.6  MG -- --  PHOS 4.4 5.3*    Basename 11/21/11 0500 11/19/11 1911  AST -- --  ALT -- --  ALKPHOS -- --  BILITOT -- --  PROT -- --  ALBUMIN 2.1* 2.2*   No results found for this basename: LIPASE:2,AMYLASE:2 in the last 72 hours  Basename 11/21/11 0500  WBC 7.7  NEUTROABS --  HGB 9.9*  HCT 29.8*  MCV 90.0  PLT 178   No results found for this basename: CKTOTAL:3,CKMB:3,CKMBINDEX:3,TROPONINI:3 in the last 72 hours No components found with this basename: POCBNP:3 No results found for this basename: DDIMER:2 in the last 72 hours No results found for this basename: HGBA1C:2 in the last 72 hours No results found for this basename: CHOL:2,HDL:2,LDLCALC:2,TRIG:2,CHOLHDL:2,LDLDIRECT:2 in the last 72 hours No results found for this basename: TSH,T4TOTAL,FREET3,T3FREE,THYROIDAB in the last 72 hours No results found for this basename:  VITAMINB12:2,FOLATE:2,FERRITIN:2,TIBC:2,IRON:2,RETICCTPCT:2 in the last 72 hours  Micro Results: Recent Results (from the past 240 hour(s))  SURGICAL PCR SCREEN     Status: Normal   Collection Time   11/16/11  4:54 AM      Component Value Range Status Comment   MRSA, PCR NEGATIVE  NEGATIVE  Final    Staphylococcus aureus NEGATIVE  NEGATIVE  Final     Studies/Results: No results found.  Medications: I have reviewed the patient's current medications. Scheduled Meds:   . calcium carbonate  1 tablet Oral TID WC  . darbepoetin (ARANESP) injection - DIALYSIS  100 mcg Intravenous Q Thu-HD  . diltiazem  240 mg Oral Daily  . ferric gluconate (FERRLECIT/NULECIT) IV  125 mg Intravenous Q T,Th,Sa-HD  . hydrALAZINE  50 mg Oral BID  . insulin aspart  0-9 Units Subcutaneous TID WC  . levothyroxine  50 mcg Oral Q breakfast  . multivitamin  1 tablet Oral QHS  . pantoprazole  40 mg Oral BID  . sodium chloride  3 mL Intravenous Q12H  . sodium chloride  3 mL Intravenous Q12H   Continuous Infusions:   . 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 Fistula has been placed. Is waiting for the clipping process to be completed. Currently dialyzing with a temp HD catheter. Renal following. Received HD 3/19.   #2 Pericardial Effusion: No signs of tamponade. Should be improving with HD. Followup ECHO done- cardiology following awaiting recs  #3 Dispo: DC home once work up complete   LOS: 7 days  Paz Winsett, DO 11/22/2011, 1:21 PM

## 2011-11-23 ENCOUNTER — Inpatient Hospital Stay (HOSPITAL_COMMUNITY): Payer: Medicaid Other

## 2011-11-23 LAB — CBC
MCV: 87.7 fL (ref 78.0–100.0)
Platelets: 173 10*3/uL (ref 150–400)
RBC: 3.25 MIL/uL — ABNORMAL LOW (ref 3.87–5.11)
WBC: 7.9 10*3/uL (ref 4.0–10.5)

## 2011-11-23 LAB — RENAL FUNCTION PANEL
CO2: 28 mEq/L (ref 19–32)
Chloride: 99 mEq/L (ref 96–112)
Creatinine, Ser: 5.39 mg/dL — ABNORMAL HIGH (ref 0.50–1.10)
GFR calc Af Amer: 11 mL/min — ABNORMAL LOW (ref >90)
GFR calc non Af Amer: 10 mL/min — ABNORMAL LOW (ref >90)
Potassium: 3.6 mEq/L (ref 3.5–5.1)
Sodium: 138 mEq/L (ref 135–145)

## 2011-11-23 LAB — GLUCOSE, CAPILLARY
Glucose-Capillary: 197 mg/dL — ABNORMAL HIGH (ref 70–99)
Glucose-Capillary: 92 mg/dL (ref 70–99)

## 2011-11-23 MED ORDER — OXYCODONE-ACETAMINOPHEN 5-325 MG PO TABS
ORAL_TABLET | ORAL | Status: AC
  Filled 2011-11-23: qty 2

## 2011-11-23 MED ORDER — LISINOPRIL 10 MG PO TABS
10.0000 mg | ORAL_TABLET | Freq: Every day | ORAL | Status: DC
  Administered 2011-11-23 – 2011-11-26 (×4): 10 mg via ORAL
  Filled 2011-11-23 (×5): qty 1

## 2011-11-23 MED ORDER — DARBEPOETIN ALFA-POLYSORBATE 100 MCG/0.5ML IJ SOLN
INTRAMUSCULAR | Status: AC
  Administered 2011-11-23: 100 ug via INTRAVENOUS
  Filled 2011-11-23: qty 0.5

## 2011-11-23 MED ORDER — INSULIN ASPART 100 UNIT/ML ~~LOC~~ SOLN
4.0000 [IU] | Freq: Three times a day (TID) | SUBCUTANEOUS | Status: DC
  Administered 2011-11-23 – 2011-11-27 (×10): 4 [IU] via SUBCUTANEOUS

## 2011-11-23 MED ORDER — HYDROCODONE-ACETAMINOPHEN 5-325 MG PO TABS
ORAL_TABLET | ORAL | Status: AC
  Administered 2011-11-23: 2 via ORAL
  Filled 2011-11-23: qty 2

## 2011-11-23 NOTE — Progress Notes (Signed)
Utilization review completed.  

## 2011-11-23 NOTE — Progress Notes (Signed)
No active cardiac issues taht i see  plz call for further questions

## 2011-11-23 NOTE — Progress Notes (Addendum)
[facility]GRESS NOTE Resident Note   Please see below for attending addendum to resident note.  Subjective:    Patient reports about one episode of emesis yesterday and since suprapubic pain . Continues to have nausea which is controlled with Zofran. Denies any burning sensation with urination. Denies any SOB, chest pain, flank pain. Continues to have chest wall and pleuritic pain.    Objective:    Vital Signs:   Temp:  [97.9 F (36.6 C)-98.6 F (37 C)] 98.1 F (36.7 C) (03/21 0523) Pulse Rate:  [95-104] 104  (03/21 0523) Resp:  [19-20] 19  (03/21 0523) BP: (148-164)/(75-82) 164/79 mmHg (03/21 0523) SpO2:  [92 %-98 %] 92 % (03/21 0523) Weight:  [120 lb 5.9 oz (54.6 kg)] 120 lb 5.9 oz (54.6 kg) (03/20 2109) Last BM Date: 11/22/11  24-hour weight change: Weight change: -12 lb 5.5 oz (-5.6 kg)  Weight trends: Filed Weights   11/21/11 0756 11/21/11 1126 11/22/11 2109  Weight: 132 lb 11.5 oz (60.2 kg) 125 lb 10.6 oz (57 kg) 120 lb 5.9 oz (54.6 kg)    Intake/Output:  03/20 0701 - 03/21 0700 In: 600 [P.O.:600] Out: 750 [Urine:750]  Physical Exam: General: Vital signs reviewed and noted. Well-developed, well-nourished, in no acute distress; alert, appropriate and cooperative throughout examination.  Lungs:  Normal respiratory effort. Good air movement anteriorly . Unable to evaluate posterior area since patient is receiving HD.   Heart: RRR. S1 and S2 normal without gallop, murmur, or rubs.  Abdomen:  BS normoactive. Soft, Nondistended. Mild suprapubic tenderness on palpation. No rebound tenderness.   Extremities: No pretibial edema.     Labs: Basic Metabolic Panel:  Lab [id number] 0500 11/19/11 1911 11/18/11 1941 11/17/11 0430  NA 137 135 137 135  K 4.3 4.3 4.4 4.3  CL 99 100 102 104  CO2 28 25 23  18*  GLUCOSE 172* 136* 102* 74  BUN 22 27* 41* 56*  CREATININE 5.16* 5.17* 6.36* 7.19*  CALCIUM 8.7 8.6 8.5 --  MG -- -- -- --  PHOS 4.4 5.3* 6.5* --    Liver  Function Tests:  Lab 11/21/11 0500 11/19/11 1911 11/18/11 1941  AST -- -- --  ALT -- -- --  ALKPHOS -- -- --  BILITOT -- -- --  PROT -- -- --  ALBUMIN 2.1* 2.2* 2.3*    CBC:  Lab 11/21/11 0500 11/18/11 1941 11/17/11 0430  WBC 7.7 8.7 7.1  NEUTROABS -- -- --  HGB 9.9* 9.8* 10.0*  HCT 29.8* 29.6* 29.7*  MCV 90.0 89.4 90.5  PLT 178 172 170     CBG:  Lab 11/22/11 2107 11/22/11 1629 11/22/11 1149 11/22/11 0752 11/22/11 0704  GLUCAP 184* 168* 178* 176* 175*    Microbiology: Results for orders placed during the hospital encounter of 11/15/11  SURGICAL PCR SCREEN     Status: Normal   Collection Time   11/16/11  4:54 AM      Component Value Range Status Comment   MRSA, PCR NEGATIVE  NEGATIVE  Final    Staphylococcus aureus NEGATIVE  NEGATIVE  Final      Medications:    Infusions:    . dextrose 10 mL (11/16/11 1113)    Scheduled Medications:    . calcium carbonate  1 tablet Oral TID WC  . darbepoetin (ARANESP) injection - DIALYSIS  100 mcg Intravenous Q Thu-HD  . diltiazem  240 mg Oral Daily  . ferric gluconate (FERRLECIT/NULECIT) IV  125 mg Intravenous Q T,Th,Sa-HD  .  hydrALAZINE  50 mg Oral BID  . insulin aspart  0-9 Units Subcutaneous TID WC  . levothyroxine  50 mcg Oral Q breakfast  . multivitamin  1 tablet Oral QHS  . pantoprazole  40 mg Oral BID  . sodium chloride  3 mL Intravenous Q12H  . sodium chloride  3 mL Intravenous Q12H    PRN Medications: sodium chloride, sodium chloride, sodium chloride, acetaminophen, acetaminophen, albuterol, dextrose, feeding supplement (NEPRO CARB STEADY), guaiFENesin-dextromethorphan, heparin, heparin, HYDROcodone-acetaminophen, lidocaine, lidocaine-prilocaine, metoprolol, morphine, ondansetron (ZOFRAN) IV, oxyCODONE, pentafluoroprop-tetrafluoroeth, sodium chloride   Assessment/ Plan:    Pt is a 31 y.o. yo female with a PMHx of DM, Hypertension, CKD who was admitted on 11/15/2011 with Uremia. Renal service is consulted for  evaluation and management of ESRD .   1. 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 Scheduled for HD today . Will need several HD therapy prior to discharge. Will have TTS schedule. Working on outpatient set up for HD.   2. Anemia - Currently on Aranesp and ferric gluconate .   3. Pleural/pericardial effusion likely secondary to Uremia. 2D-Echo showed persistent pericardial effusion despite several session of HD. No Paracardial centesis per Cardiology at this point.  May consider to repeat Cxray. Last Cxray 3/8 showed worsening pleural effusion. Pleural effusion may have improved with HD but patient is not to have decreased breath sounds at the basis although patient is asymptomatic   4. Hypertension- Mildly elevated BP with 148-164/76-79. Currently on Diltiazem and Hydralazine. May improve with HD. Caution with decreasing BP to fast in the setting of persistent pericardial effusion.   5. Pain- controlled with pain medication.   5. Tachycardia with HR 93-104.   6. Abdominal pain/Nausea /Vomiting: Unclear.  May consider to check UA   Length of Stay: 8 days  Patient history and plan of care reviewed with attending, Dr. Elmarie Shiley.   Durwin Nora, Internal Medicine Resident 11/23/2011, 7:04 AM  I have seen and examined this patient and agree with the assessment/plan as outlined above by Newt Lukes MD (PGY2). Plan for UA and DC foley catheter today given suprapubic discomfort. Add lisinopril 10mg  PO qHS to help BP control. CXR in AM. Still awaiting HD placement- now fortunately has a Medicaid Pending #- anticipate placement later today ?DC tomorrow or Saturday depending HD schedule. Vasili Fok K.,MD 11/23/2011 9:38 AM

## 2011-11-23 NOTE — Procedures (Signed)
I was present at this session.  I have reviewed the session itself and made appropriate changes.  [name] Lindner K. 3/21/20139:42 AM

## 2011-11-23 NOTE — Progress Notes (Signed)
   CARE MANAGEMENT NOTE 11/23/2011  Patient:  [name] [name],[name] [name]   Account Number:  [id number]  Date Initiated:  11/23/2011  Documentation initiated by:  Tomi Bamberger  Subjective/Objective Assessment:   dx ckd  admit- lives with friend.  pta independent.     Action/Plan:   waiting on clipping for HD,  per Bethena Roys patient will need to go to [facility] close to [employer].  We have mcd pending, but transportation issues need to be worked out,  [place] working on.   Anticipated DC Date:  11/24/2011   Anticipated DC Plan:  [place]  CM consult      Choice offered to / List presented to:             Status of service:  In process, will continue to follow Medicare Important Message given?   (If response is "NO", the following Medicare IM given date fields will be blank) Date Medicare IM given:   Date Additional Medicare IM given:    Discharge Disposition:    Per UR Regulation:    If discussed at Long Length of Stay Meetings, dates discussed:    Comments:  11/23/11 10:21 Tomi Bamberger RN, BSN [place] in HD is working on clipping patient,  paper work has been done, just waiting to here if [facility] HD center will accept patient, and CSW is working out transportation issues.  Bethena Roys will let me know when clipping has been completed.

## 2011-11-23 NOTE — Progress Notes (Signed)
Subjective:  C/o some nausea- finished dialysis   Objective: Vital signs in last 24 hours: Filed Vitals:   11/23/11 0653 11/23/11 0658 11/23/11 0730 11/23/11 0800  BP: 165/82 161/83 187/92 192/93  Pulse: 105 97 108 109  Temp:      TempSrc:      Resp:      Height:      Weight: 55.9 kg (123 lb 3.8 oz)     SpO2:  99%     Weight change: -5.6 kg (-12 lb 5.5 oz)  Intake/Output Summary (Last 24 hours) at 11/23/11 0836 Last data filed at 11/22/11 1700  Gross per 24 hour  Intake    600 ml  Output    750 ml  Net   -150 ml    Physical Exam: General: Awake, Oriented, No acute distress. HEENT: EOMI. Neck: Supple, catheter in place CV: S1 and S2, rrr Lungs: Clear to ascultation bilaterally, no wheezing Abdomen: Soft, Nontender, Nondistended, +bowel sounds. Ext: Good pulses. Trace edema.   Lab Results:  Basename 11/23/11 0728 11/21/11 0500  NA 138 137  K 3.6 4.3  CL 99 99  CO2 28 28  GLUCOSE 170* 172*  BUN 27* 22  CREATININE 5.39* 5.16*  CALCIUM 8.7 8.7  MG -- --  PHOS 4.2 4.4    Basename 11/23/11 0728 11/21/11 0500  AST -- --  ALT -- --  ALKPHOS -- --  BILITOT -- --  PROT -- --  ALBUMIN 2.2* 2.1*   No results found for this basename: LIPASE:2,AMYLASE:2 in the last 72 hours  Basename 11/23/11 0728 11/21/11 0500  WBC 7.9 7.7  NEUTROABS -- --  HGB 9.5* 9.9*  HCT 28.5* 29.8*  MCV 87.7 90.0  PLT 173 178   No results found for this basename: CKTOTAL:3,CKMB:3,CKMBINDEX:3,TROPONINI:3 in the last 72 hours No components found with this basename: POCBNP:3 No results found for this basename: DDIMER:2 in the last 72 hours No results found for this basename: HGBA1C:2 in the last 72 hours No results found for this basename: CHOL:2,HDL:2,LDLCALC:2,TRIG:2,CHOLHDL:2,LDLDIRECT:2 in the last 72 hours No results found for this basename: TSH,T4TOTAL,FREET3,T3FREE,THYROIDAB in the last 72 hours No results found for this basename:  VITAMINB12:2,FOLATE:2,FERRITIN:2,TIBC:2,IRON:2,RETICCTPCT:2 in the last 72 hours  Micro Results: Recent Results (from the past 240 hour(s))  SURGICAL PCR SCREEN     Status: Normal   Collection Time   11/16/11  4:54 AM      Component Value Range Status Comment   MRSA, PCR NEGATIVE  NEGATIVE  Final    Staphylococcus aureus NEGATIVE  NEGATIVE  Final     Studies/Results: No results found.  Medications: I have reviewed the patient's current medications. Scheduled Meds:    . calcium carbonate  1 tablet Oral TID WC  . darbepoetin (ARANESP) injection - DIALYSIS  100 mcg Intravenous Q Thu-HD  . diltiazem  240 mg Oral Daily  . ferric gluconate (FERRLECIT/NULECIT) IV  125 mg Intravenous Q T,Th,Sa-HD  . hydrALAZINE  50 mg Oral BID  . HYDROcodone-acetaminophen      . insulin aspart  0-9 Units Subcutaneous TID WC  . levothyroxine  50 mcg Oral Q breakfast  . multivitamin  1 tablet Oral QHS  . pantoprazole  40 mg Oral BID  . sodium chloride  3 mL Intravenous Q12H  . sodium chloride  3 mL Intravenous Q12H   Continuous Infusions:    . 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 Fistula has been placed. Is waiting for the clipping process to be completed. Currently dialyzing with a temp HD catheter. Renal following. Received HD 3/19. And 3/21- ? D/C this weekend?  #2 Pericardial Effusion/pleural effusion: No signs of tamponade. Should be improving with HD. Followup ECHO done- cardiology following awaiting recs, chest x-ray in AM- ? Need for thoracentesis  #3 diabetes- BS 178-184,  SSI add novolog   Dispo: DC home once work up complete- ? Saturday PM   LOS: 8 days  Mylani Gentry, DO 11/23/2011, 8:36 AM

## 2011-11-24 ENCOUNTER — Encounter (HOSPITAL_COMMUNITY): Payer: Medicaid Other

## 2011-11-24 ENCOUNTER — Inpatient Hospital Stay (HOSPITAL_COMMUNITY): Payer: Medicaid Other

## 2011-11-24 LAB — URINE MICROSCOPIC-ADD ON

## 2011-11-24 LAB — GLUCOSE, CAPILLARY
Glucose-Capillary: 179 mg/dL — ABNORMAL HIGH (ref 70–99)
Glucose-Capillary: 146 mg/dL — ABNORMAL HIGH (ref 70–99)
Glucose-Capillary: 122 mg/dL — ABNORMAL HIGH (ref 70–99)

## 2011-11-24 LAB — URINALYSIS, ROUTINE W REFLEX MICROSCOPIC
Bilirubin Urine: NEGATIVE
Specific Gravity, Urine: 1.03 (ref 1.005–1.030)
pH: 6.5 (ref 5.0–8.0)

## 2011-11-24 IMAGING — CR DG CHEST 2V
2 series · 2 of 2 positions shown · non-contrast
Comparison: [DATE]

CLINICAL DATA: Chest pain, shortness of breath, pleural effusion

CHEST - 2 VIEW

[w chest pa]
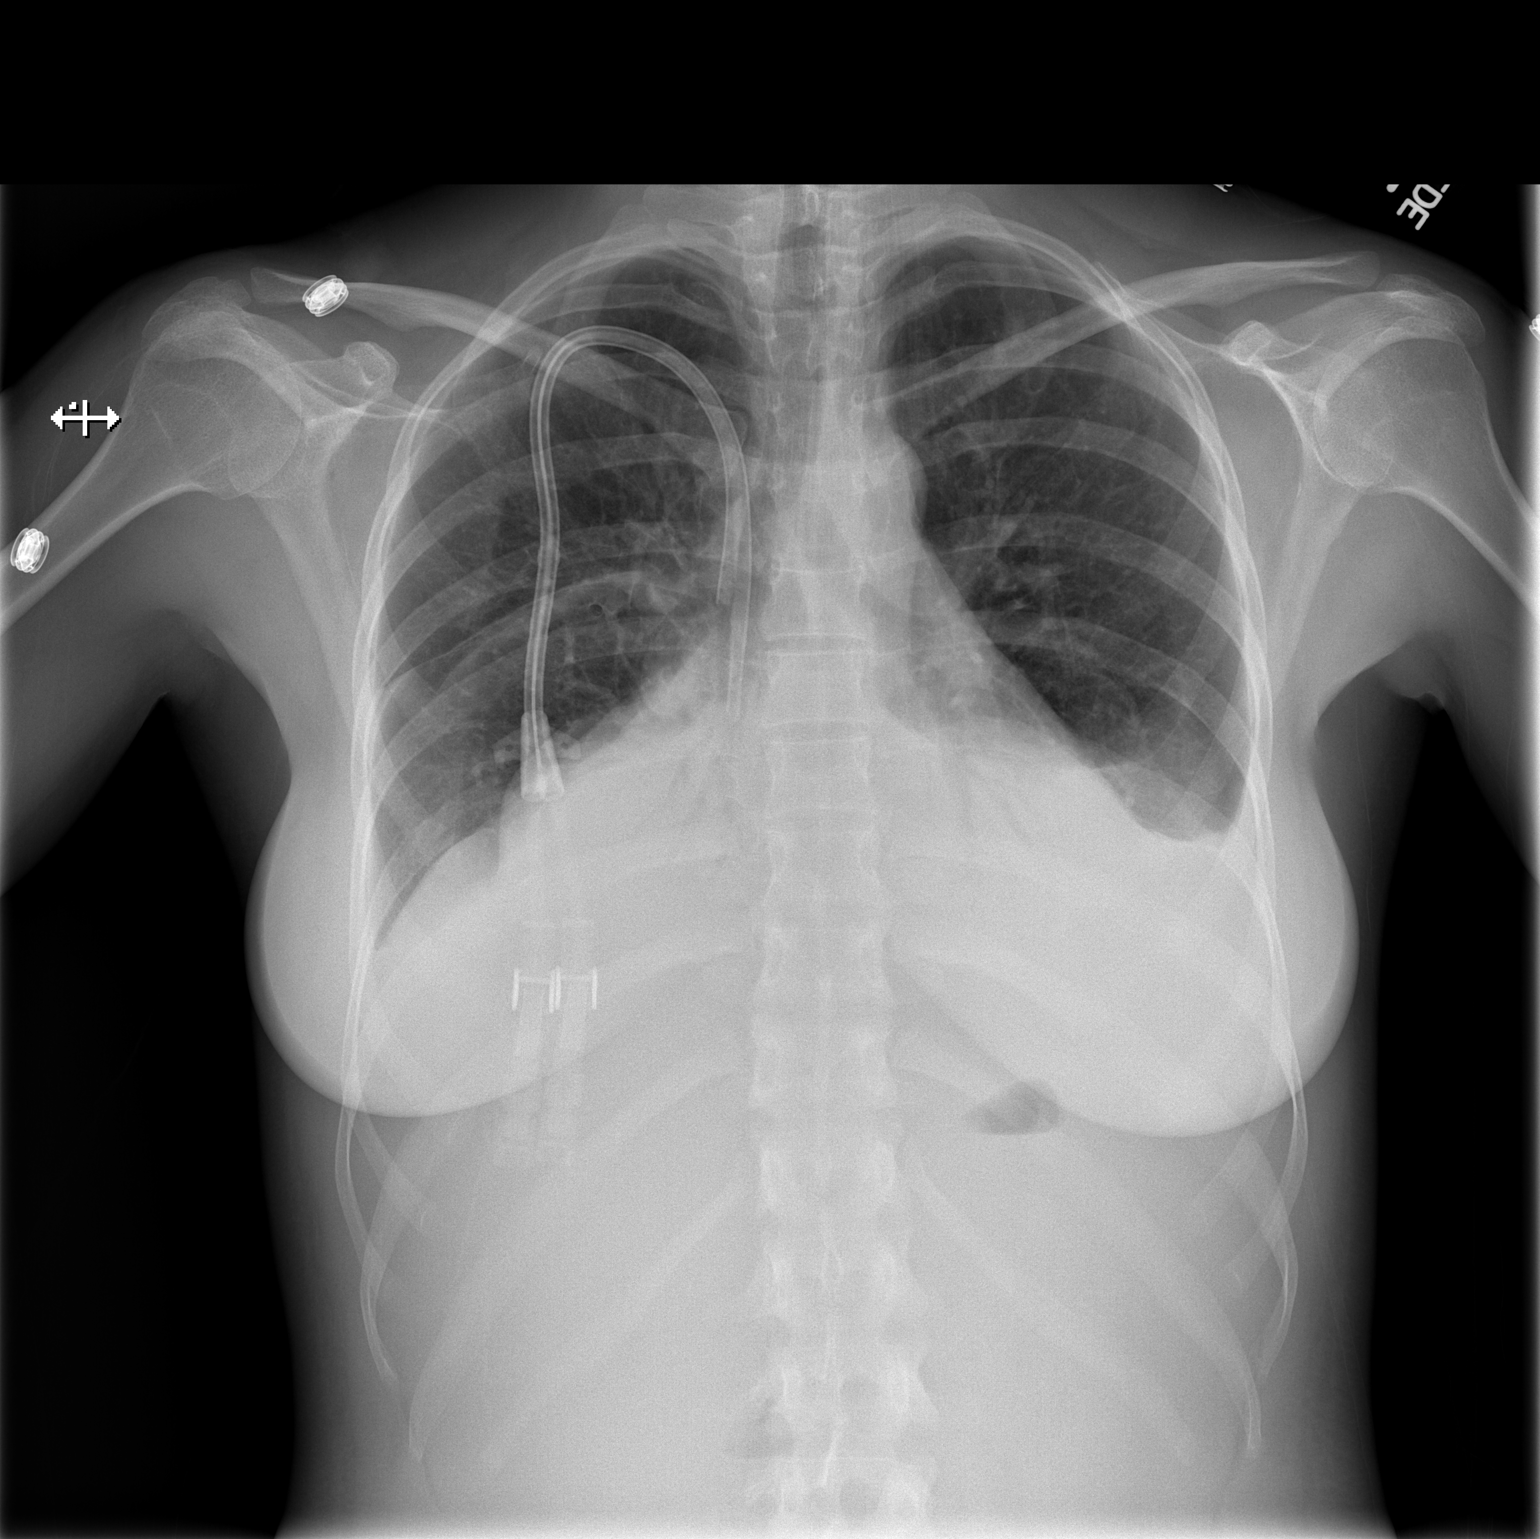

[w chest lat]
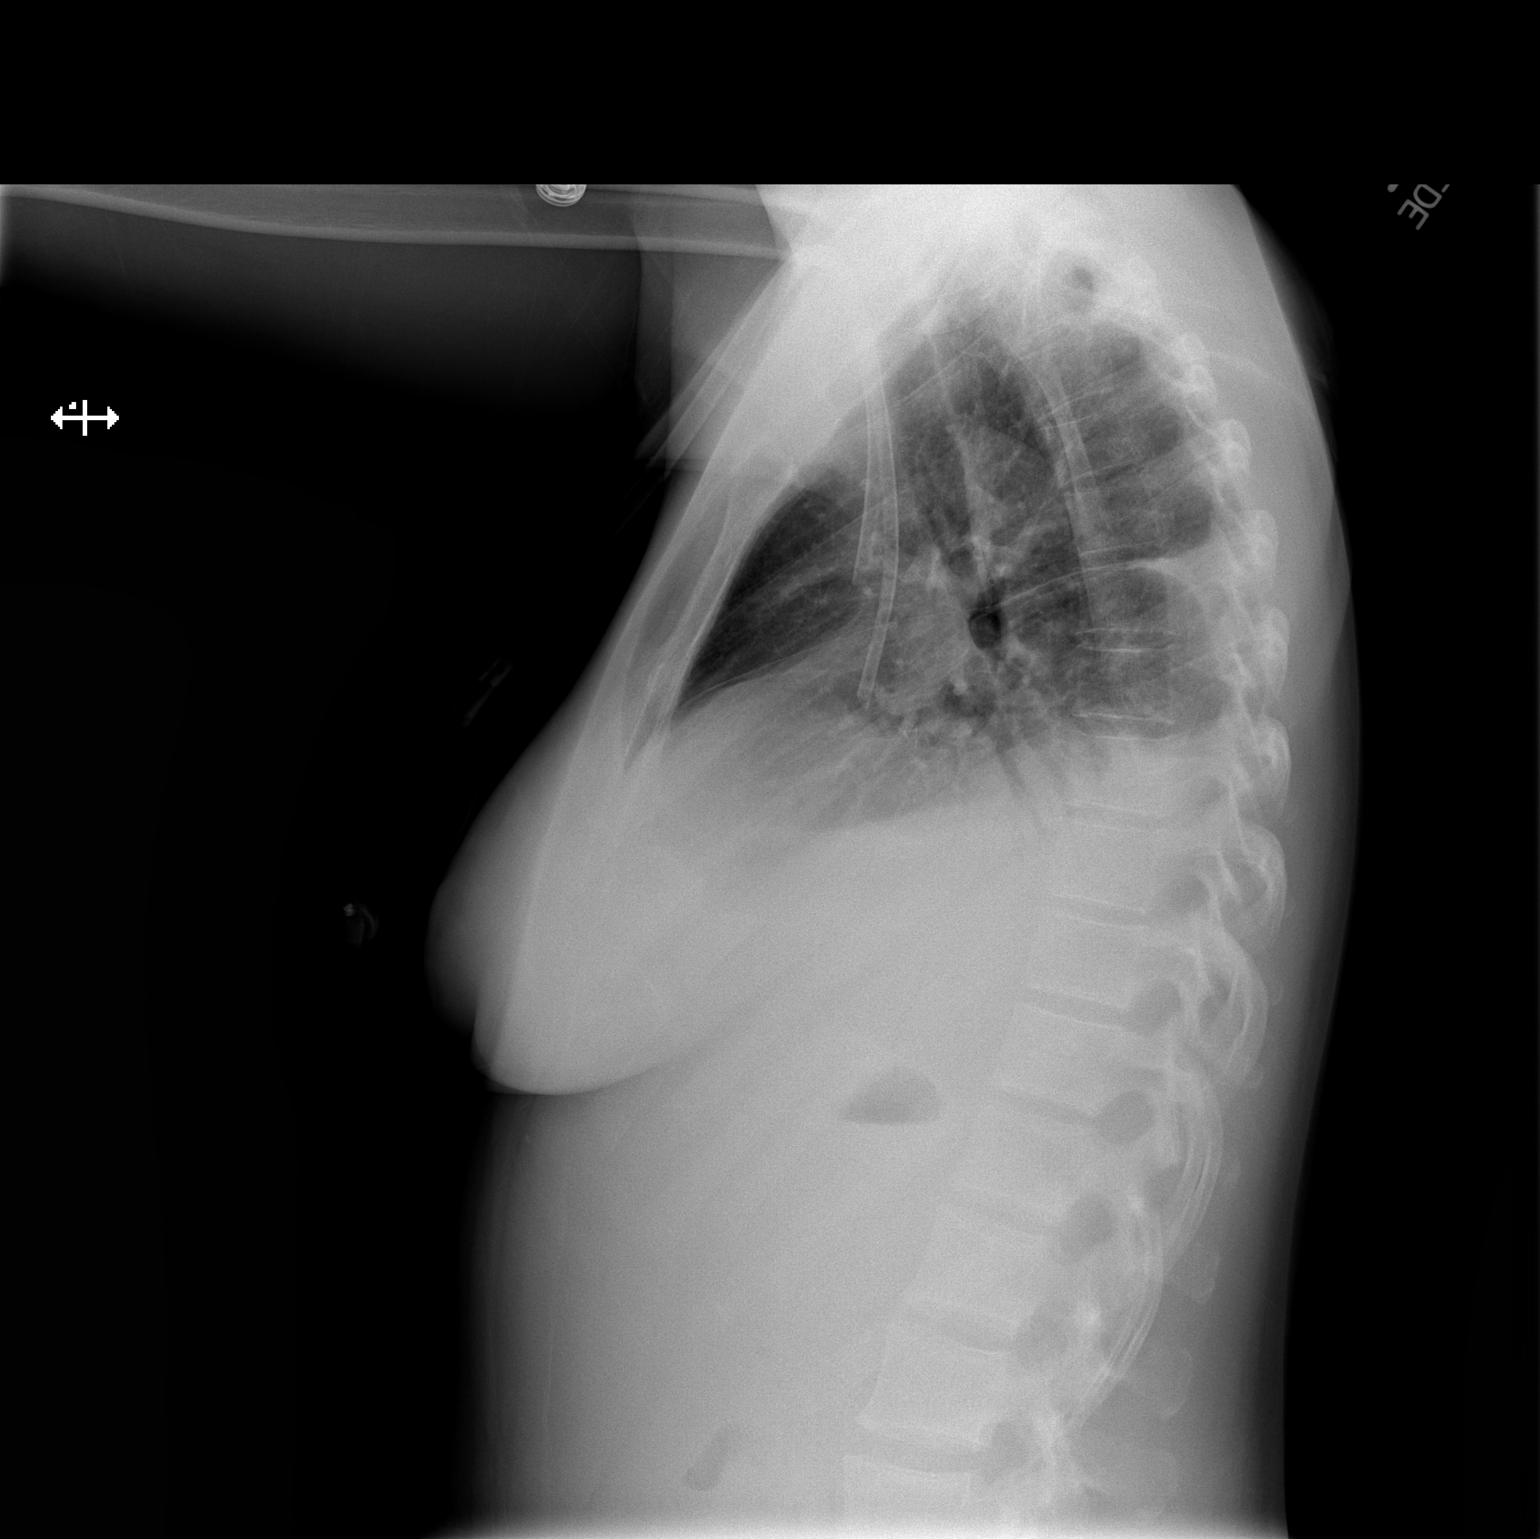

[2 of 2 positions shown; findings below may reference images not displayed]

FINDINGS: Cardiomegaly.  No frank interstitial edema.  Small
bilateral pleural effusions, left greater than right.  Associated
bilateral lower lobe opacities, likely atelectasis.

No pneumothorax.

Right sided tunneled dialysis catheter.
IMPRESSION: Cardiomegaly with bilateral pleural effusions, left greater than
right.  No frank interstitial edema.

Associated bilateral lower lobe opacities, likely atelectasis.

## 2011-11-24 MED ORDER — INSULIN GLARGINE 100 UNIT/ML ~~LOC~~ SOLN
3.0000 [IU] | Freq: Every day | SUBCUTANEOUS | Status: DC
  Administered 2011-11-24 – 2011-11-26 (×3): 3 [IU] via SUBCUTANEOUS

## 2011-11-24 MED ORDER — CIPROFLOXACIN HCL 500 MG PO TABS
500.0000 mg | ORAL_TABLET | Freq: Every day | ORAL | Status: DC
  Administered 2011-11-24 – 2011-11-25 (×2): 500 mg via ORAL
  Filled 2011-11-24 (×4): qty 1

## 2011-11-24 MED ORDER — CIPROFLOXACIN HCL 250 MG PO TABS
250.0000 mg | ORAL_TABLET | Freq: Two times a day (BID) | ORAL | Status: DC
Start: 2011-11-24 — End: 2011-11-24

## 2011-11-24 NOTE — Progress Notes (Deleted)
Clinical Social Work Department BRIEF PSYCHOSOCIAL ASSESSMENT 11/24/2011  Patient:  [name] [name]     Account Number:  [id number]     Admit date:  11/23/2011  Clinical Social Worker:  Frederico Hamman  Date/Time:  11/24/2011 01:37 PM  Referred by:  Care Management  Date Referred:  11/24/2011 Referred for  SNF Placement   Other Referral:   Interview type:  Patient Other interview type:   Also talked with wife Demetria, who was at the bedside.    PSYCHOSOCIAL DATA Living Status:  FAMILY Admitted from facility:   Level of care:   Primary support name:  Betsey Amen Primary support relationship to patient:  SPOUSE Degree of support available:   Strong support - wife is primary caregiver    CURRENT CONCERNS Current Concerns  Other - See comment   Other Concerns:   Placement issues; patient's ability to manage at home    [place] / PLAN CSW and nurse case manager BJ talked with patient and wife about discharge planning. Pt experiencing a lot of pain and has been bedbound for several days due to pain prior to coming to hospital. Wife confirmed patients pain issues and inability to get out of bed. Nurse case manager talked with patient about discharge options that included home-possibly with PT/OT, a skilled facility orLTAC.  Patient refused skilled placement as his income supports his family. He knows that Medicaid only pays for 3 therapy sessions and feels that is not enough to help him. He is open to or LTC if it does not affect his income.   Assessment/plan status:  No Further Intervention Required Other assessment/ plan:   Information/referral to community resources:   CSW and nurse case manager talked with Lelan Pons with [place] and they can provide patient with a nurse, but pt not eligible for therapies as his fractures are a few months old. Nurse case manager will f/u with LTAC and update CSW.    PATIENT'S/FAMILY'S RESPONSE TO PLAN OF  CARE: Mr. Nevada Crane was slightly frustrated as he has not been given IV pain meds this admission. He was adamant about not talking money away from his family. Patient's wife very devoted to her husband and concerned about his pain issues and being able to get to outpatient dialysis. CSW signing off - if the need arises for clinical social work services, please advise.

## 2011-11-24 NOTE — Progress Notes (Signed)
Subjective:  C/o flank pain- relieved with morphine, pain only with deep breathing   Objective: Vital signs in last 24 hours: Filed Vitals:   11/24/11 0133 11/24/11 0343 11/24/11 0644 11/24/11 0700  BP: 146/78 153/79 150/76   Pulse: 98 99 108   Temp: 97.7 F (36.5 C) 98.1 F (36.7 C) 98.9 F (37.2 C)   TempSrc: Oral Oral Oral   Resp: 20 19 18    Height:      Weight:      SpO2: 95% 96% 89% 98%   Weight change: -1.5 kg (-3 lb 4.9 oz)  Intake/Output Summary (Last 24 hours) at 11/24/11 0841 Last data filed at 11/24/11 0344  Gross per 24 hour  Intake    600 ml  Output   3500 ml  Net  -2900 ml    Physical Exam: General: Awake, Oriented, No acute distress. HEENT: EOMI. Neck: Supple, catheter in place CV: S1 and S2, rrr Lungs: coarse B/S B/L, no wheezing- cleared some with cough Abdomen: Soft, Nontender, Nondistended, +bowel sounds. Ext: Good pulses. No edema.   Lab Results:  [facility] 11/23/11 0728  NA 138  K 3.6  CL 99  CO2 28  GLUCOSE 170*  BUN 27*  CREATININE 5.39*  CALCIUM 8.7  MG --  PHOS 4.2    Basename 11/23/11 0728  AST --  ALT --  ALKPHOS --  BILITOT --  PROT --  ALBUMIN 2.2*   No results found for this basename: LIPASE:2,AMYLASE:2 in the last 72 hours  Basename 11/23/11 0728  WBC 7.9  NEUTROABS --  HGB 9.5*  HCT 28.5*  MCV 87.7  PLT 173   No results found for this basename: CKTOTAL:3,CKMB:3,CKMBINDEX:3,TROPONINI:3 in the last 72 hours No components found with this basename: POCBNP:3 No results found for this basename: DDIMER:2 in the last 72 hours No results found for this basename: HGBA1C:2 in the last 72 hours No results found for this basename: CHOL:2,HDL:2,LDLCALC:2,TRIG:2,CHOLHDL:2,LDLDIRECT:2 in the last 72 hours No results found for this basename: TSH,T4TOTAL,FREET3,T3FREE,THYROIDAB in the last 72 hours No results found for this basename: VITAMINB12:2,FOLATE:2,FERRITIN:2,TIBC:2,IRON:2,RETICCTPCT:2 in the last 72 hours  Micro  Results: Recent Results (from the past 240 hour(s))  SURGICAL PCR SCREEN     Status: Normal   Collection Time   11/16/11  4:54 AM      Component Value Range Status Comment   MRSA, PCR NEGATIVE  NEGATIVE  Final    Staphylococcus aureus NEGATIVE  NEGATIVE  Final     Studies/Results: No results found.  Medications: I have reviewed the patient's current medications. Scheduled Meds:    . calcium carbonate  1 tablet Oral TID WC  . darbepoetin (ARANESP) injection - DIALYSIS  100 mcg Intravenous Q Thu-HD  . diltiazem  240 mg Oral Daily  . ferric gluconate (FERRLECIT/NULECIT) IV  125 mg Intravenous Q T,Th,Sa-HD  . hydrALAZINE  50 mg Oral BID  . insulin aspart  0-9 Units Subcutaneous TID WC  . insulin aspart  4 Units Subcutaneous TID WC  . levothyroxine  50 mcg Oral Q breakfast  . lisinopril  10 mg Oral QHS  . multivitamin  1 tablet Oral QHS  . pantoprazole  40 mg Oral BID  . sodium chloride  3 mL Intravenous Q12H  . sodium chloride  3 mL Intravenous Q12H   Continuous Infusions:    . 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 Fistula has been placed. Is waiting for the clipping process to be completed. Currently dialyzing with a temp HD catheter. Renal following. Received HD 3/19. And 3/21- ? D/C this weekend?  #2 Pericardial Effusion/pleural effusion: No signs of tamponade. Should be improving with HD. Followup ECHO done- cardiology following awaiting recs, chest x-ray in AM- ? Need for thoracentesis  #3 diabetes- BS 92-179, SSI add novolog with meals, reorder small dose  lantus- watch for hypoglycemia    Dispo: DC home once work up complete- ? Saturday PM   LOS: 9 days  Neizan Debruhl, DO 11/24/2011, 8:41 AM

## 2011-11-24 NOTE — Progress Notes (Addendum)
KIDNEY PROGRESS NOTE Resident Note   Please see below for attending addendum to resident note.  Subjective:   Patient reports that since this morning she was experiencing right flank pain 8-9/10 in severity. She further noted back pain in the scapula area with deep inspiration. Suprapubic pain is still present. She noted that her urine output continues to be decreased.  No episode of emesis but continues to have mild nausea.    Objective:    Vital Signs:   Temp:  [97.7 F (36.5 C)-98.9 F (37.2 C)] 98.9 F (37.2 C) (03/22 0644) Pulse Rate:  [94-109] 108  (03/22 0644) Resp:  [18-20] 18  (03/22 0644) BP: (101-192)/(72-93) 150/76 mmHg (03/22 0644) SpO2:  [89 %-98 %] 89 % (03/22 0644) Weight:  [117 lb 1 oz (53.1 kg)-117 lb 11.6 oz (53.4 kg)] 117 lb 11.6 oz (53.4 kg) (03/21 2117) Last BM Date: 11/23/11  24-hour weight change: Weight change: -3 lb 4.9 oz (-1.5 kg)  Weight trends: Filed Weights   11/23/11 0653 11/23/11 1100 11/23/11 2117  Weight: 123 lb 3.8 oz (55.9 kg) 117 lb 1 oz (53.1 kg) 117 lb 11.6 oz (53.4 kg)    Intake/Output:  03/21 0701 - 03/22 0700 In: 600 [P.O.:600] Out: 3500 [Urine:400]  Physical Exam: General: Vital signs reviewed and noted. Well-developed, well-nourished, in no acute distress; alert, appropriate and cooperative throughout examination.  Lungs:  Normal respiratory effort. Decreased air movement at the basis.   Heart: RRR. S1 and S2 normal   Abdomen:  BS normoactive. Soft. Right flank tenderness on palpation. No erythema. No swelling. Mild suprapubic tenderness .   Extremities: No pretibial edema.     Labs: Basic Metabolic Panel:  Lab [id number] 0728 11/21/11 0500 11/19/11 1911 11/18/11 1941  NA 138 137 135 137  K 3.6 4.3 4.3 4.4  CL 99 99 100 102  CO2 28 28 25 23   GLUCOSE 170* 172* 136* 102*  BUN 27* 22 27* 41*  CREATININE 5.39* 5.16* 5.17* 6.36*  CALCIUM 8.7 8.7 8.6 --  MG -- -- -- --  PHOS 4.2 4.4 5.3* 6.5*    Liver  Function Tests:  Lab 11/23/11 0728 11/21/11 0500 11/19/11 1911 11/18/11 1941  AST -- -- -- --  ALT -- -- -- --  ALKPHOS -- -- -- --  BILITOT -- -- -- --  PROT -- -- -- --  ALBUMIN 2.2* 2.1* 2.2* 2.3*    CBC:  Lab 11/23/11 0728 11/21/11 0500 11/18/11 1941  WBC 7.9 7.7 8.7  NEUTROABS -- -- --  HGB 9.5* 9.9* 9.8*  HCT 28.5* 29.8* 29.6*  MCV 87.7 90.0 89.4  PLT 173 178 172     CBG:  Lab 11/24/11 0403 11/23/11 2130 11/23/11 1644 11/23/11 1132 11/22/11 2107  GLUCAP 148* 92 197* 124* 184*    Microbiology: Results for orders placed during the hospital encounter of 11/15/11  SURGICAL PCR SCREEN     Status: Normal   Collection Time   11/16/11  4:54 AM      Component Value Range Status Comment   MRSA, PCR NEGATIVE  NEGATIVE  Final    Staphylococcus aureus NEGATIVE  NEGATIVE  Final      Urinalysis:  Basename 11/24/11 0142  COLORURINE YELLOW  LABSPEC 1.030  PHURINE 6.5  GLUCOSEU 250*  HGBUR TRACE*  BILIRUBINUR NEGATIVE  KETONESUR 15*  PROTEINUR >300*  UROBILINOGEN 0.2  NITRITE NEGATIVE  LEUKOCYTESUR NEGATIVE         Medications:    Infusions:    .  dextrose 10 mL (11/16/11 1113)    Scheduled Medications:    . calcium carbonate  1 tablet Oral TID WC  . darbepoetin (ARANESP) injection - DIALYSIS  100 mcg Intravenous Q Thu-HD  . diltiazem  240 mg Oral Daily  . ferric gluconate (FERRLECIT/NULECIT) IV  125 mg Intravenous Q T,Th,Sa-HD  . hydrALAZINE  50 mg Oral BID  . insulin aspart  0-9 Units Subcutaneous TID WC  . insulin aspart  4 Units Subcutaneous TID WC  . levothyroxine  50 mcg Oral Q breakfast  . lisinopril  10 mg Oral QHS  . multivitamin  1 tablet Oral QHS  . pantoprazole  40 mg Oral BID  . sodium chloride  3 mL Intravenous Q12H  . sodium chloride  3 mL Intravenous Q12H    PRN Medications: sodium chloride, sodium chloride, sodium chloride, acetaminophen, acetaminophen, albuterol, dextrose, feeding supplement (NEPRO CARB STEADY),  guaiFENesin-dextromethorphan, heparin, heparin, HYDROcodone-acetaminophen, lidocaine, lidocaine-prilocaine, metoprolol, morphine, ondansetron (ZOFRAN) IV, oxyCODONE, pentafluoroprop-tetrafluoroeth, sodium chloride   Assessment/ Plan:    Pt is a 31 y.o. yo female with a PMHx of DM, Hypertension, CKD who was admitted on 11/15/2011 with Uremia. Renal service is consulted for evaluation and management of ESRD .   1. 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 Scheduled for HD today . Will need several HD therapy prior to discharge. Will have TTS schedule. Was accepted at [facility] for TTS schedule. Patient will receive HD on 3/23 and will receive HD on 3/26 as an outpatient.  2. Anemia - stable.  Currently on Aranesp and ferric gluconate .  3. Pleural/pericardial effusion likely secondary to Uremia. 2D-Echo showed persistent pericardial effusion despite several session of HD. No Paracardial centesis per Cardiology at this point. Will obtain CXray today and evaluate pleural effusion.    4. Hypertension- Mildly elevated BP.  Currently on Diltiazem and Hydralazine as well as Lisinopril which was started 3/21.    6. Tachycardia with HR 93-104.   7. Abdominal pain/Nausea /Vomiting: UA negative for acute infection. Right flank pain unclear etiology.   Length of Stay: 9 days   Patient history and plan of care reviewed with attending, Dr. Elmarie Shiley.   Durwin Nora, Internal Medicine Resident 11/24/2011, 7:28 AM  I have seen and examined this patient and agree with the assessment/plan as outlined above by Newt Lukes MD (PGY2). Plan for HD tomorrow prior to DC home if stable- she is to [facility] HD [facility] on Tuesday. Will start PO cipro 250 BID empirically for possible cystitis/UTI- Send for Urine for Culture/sensitivities Nissim Fleischer K.,MD 11/24/2011 9:49 AM

## 2011-11-24 NOTE — Progress Notes (Addendum)
CSW met with patient regarding [employer] application and advised patient that application will be faxed to [employer] office & then given to her. SCAT application faxed to [facility]. Call also made to Ms. High at [employer] office and message left.  CSW received a call from Ms. Katheren Puller with [employer] regarding receipt of patient's application for SCAT services. Ms. Dema Severin wanted to assure that patient is aware that it could take up to 3 weeks to process/approve her application for [employer], however they do try to rush processing of dialysis patients. When asked, Ms. White informed CSW that they will contact patient regarding the approval/denial of her application. CSW called patient and reiterated that she needs to have transportation arrangement in place for 2/3 weeks until she hears from [employer].   CSW received call from [facility] with [employer]AT advising that patient has been pre-certified for SCAT transportation effective Tuesday, 11/28/11. CSW given information to share with patient by Nile Riggs and a letter was prepared and given to patient with all the information she needs to receive and maintain [employer] transportation.   Wynn Kernes Givens, MSW, LCSW [phone]

## 2011-11-25 ENCOUNTER — Inpatient Hospital Stay (HOSPITAL_COMMUNITY): Payer: Medicaid Other

## 2011-11-25 LAB — RENAL FUNCTION PANEL
BUN: 27 mg/dL — ABNORMAL HIGH (ref 6–23)
CO2: 26 mEq/L (ref 19–32)
Calcium: 8.8 mg/dL (ref 8.4–10.5)
Creatinine, Ser: 5.54 mg/dL — ABNORMAL HIGH (ref 0.50–1.10)
GFR calc non Af Amer: 10 mL/min — ABNORMAL LOW (ref >90)
Glucose, Bld: 133 mg/dL — ABNORMAL HIGH (ref 70–99)

## 2011-11-25 LAB — CBC
HCT: 28.9 % — ABNORMAL LOW (ref 36.0–46.0)
MCH: 29.5 pg (ref 26.0–34.0)
MCV: 88.9 fL (ref 78.0–100.0)
Platelets: 162 10*3/uL (ref 150–400)
RBC: 3.25 MIL/uL — ABNORMAL LOW (ref 3.87–5.11)

## 2011-11-25 LAB — GLUCOSE, CAPILLARY
Glucose-Capillary: 104 mg/dL — ABNORMAL HIGH (ref 70–99)
Glucose-Capillary: 145 mg/dL — ABNORMAL HIGH (ref 70–99)

## 2011-11-25 IMAGING — US US ABDOMEN COMPLETE
1 series · 14 of 25 positions shown · non-contrast
Comparison: [DATE] and CT of [DATE].

CLINICAL DATA: Right upper quadrant abdominal pain. Right flank
pain.  Rule out abscess.

COMPLETE ABDOMINAL ULTRASOUND

[Series 1: us abdomen complete · 0.30mm/px · 14 of 67 slices shown]
[im 1/67]
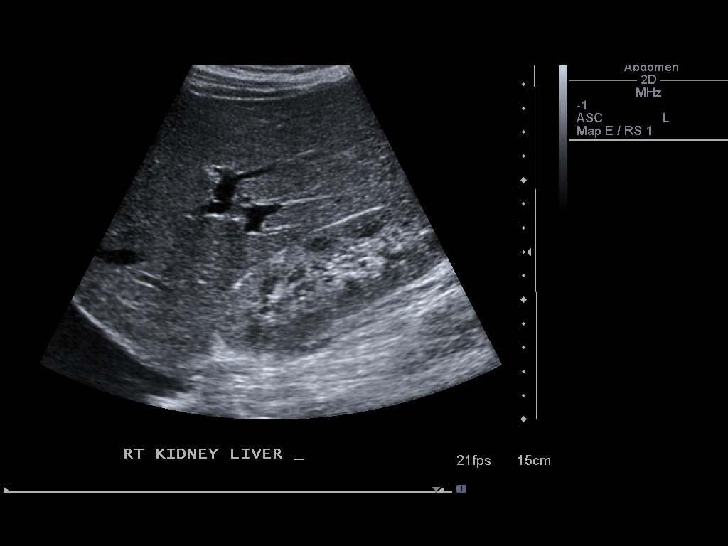
[im 6/67]
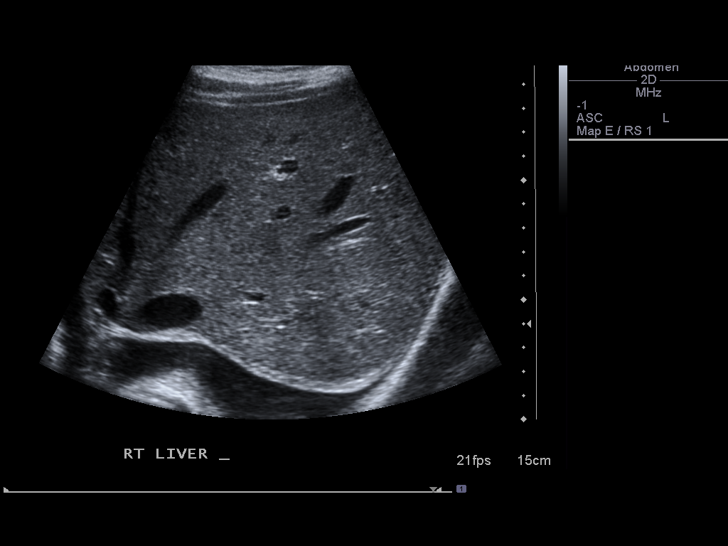
[im 12/67]
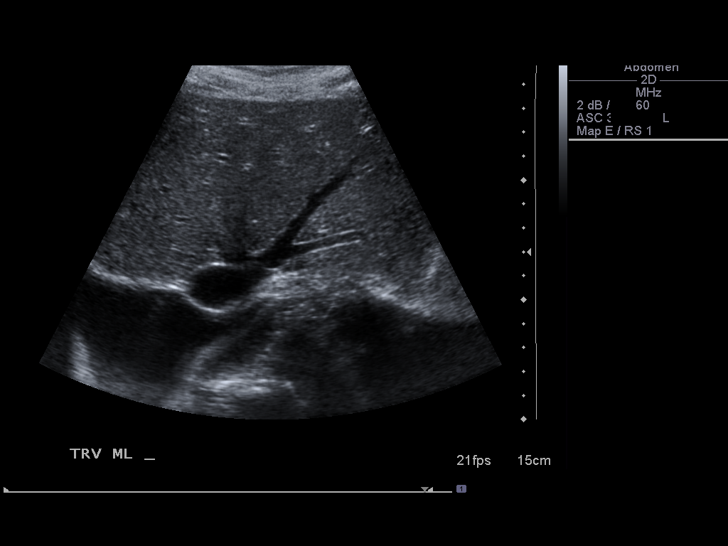
[im 17/67]
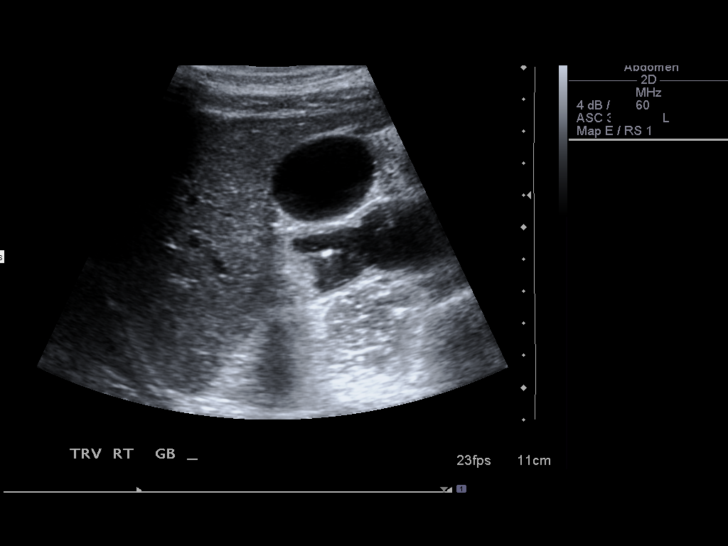
[im 23/67]
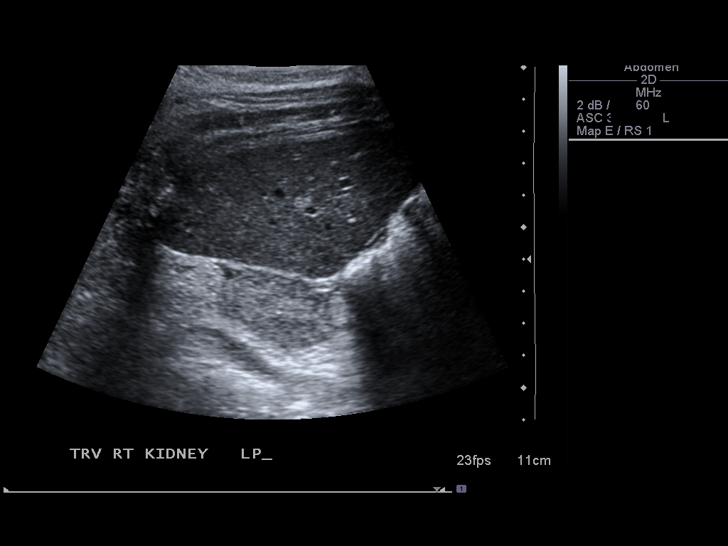
[im 25/67]
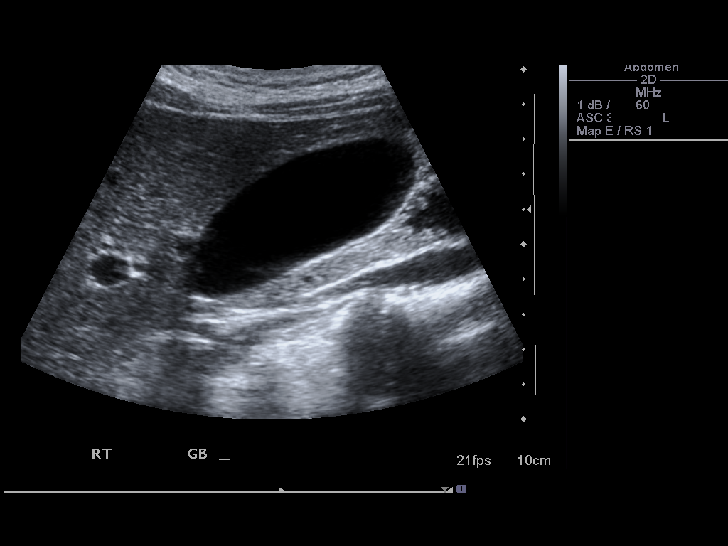
[im 31/67]
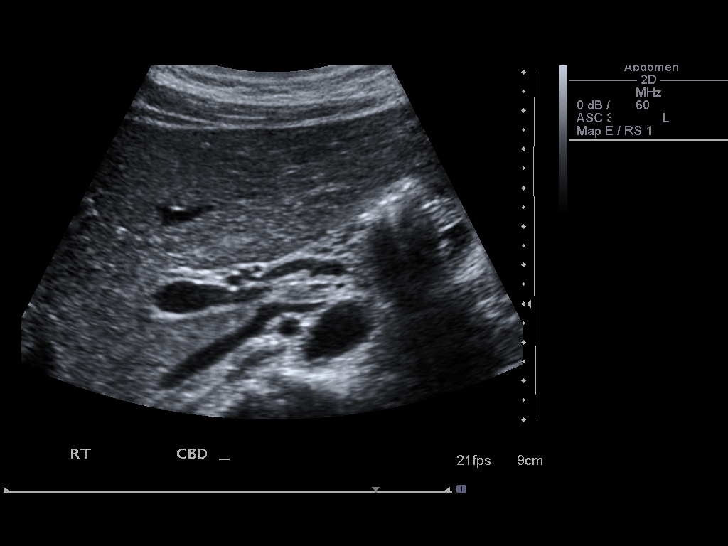
[im 36/67]
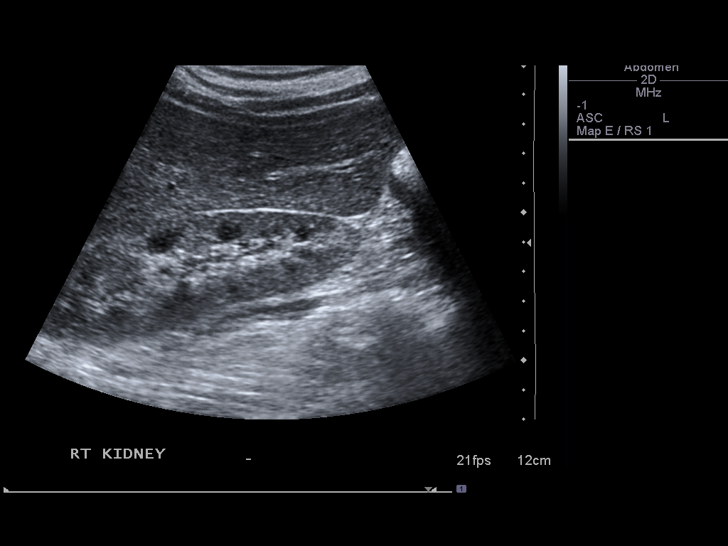
[im 42/67]
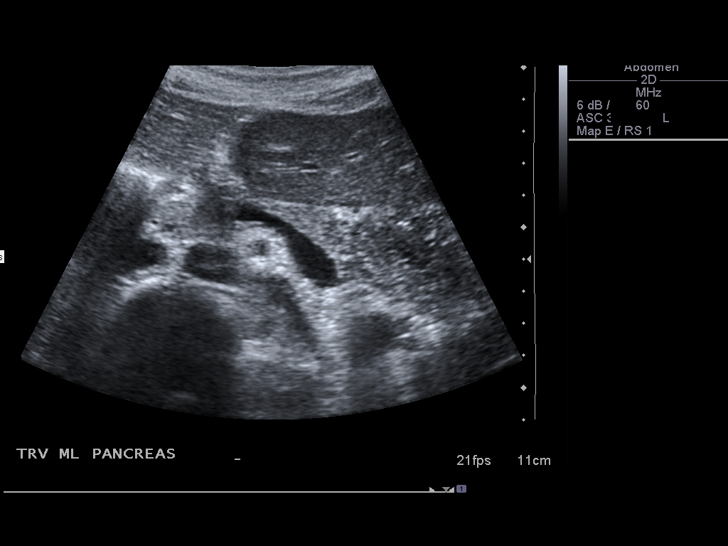
[im 45/67]
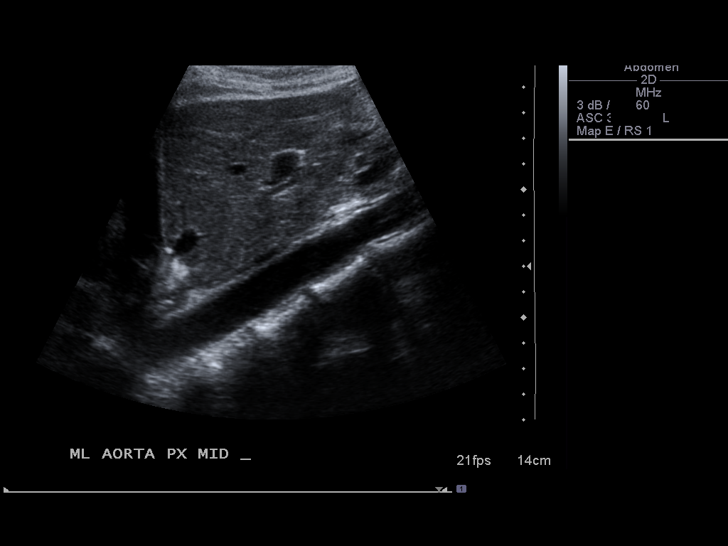
[im 50/67]
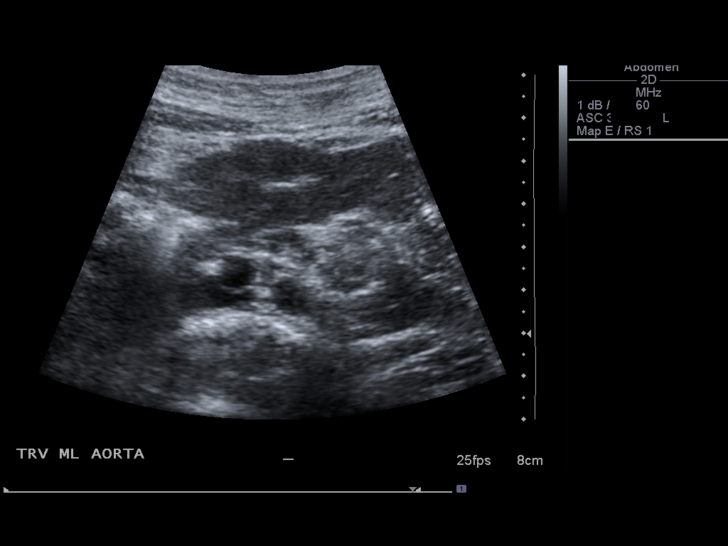
[im 56/67]
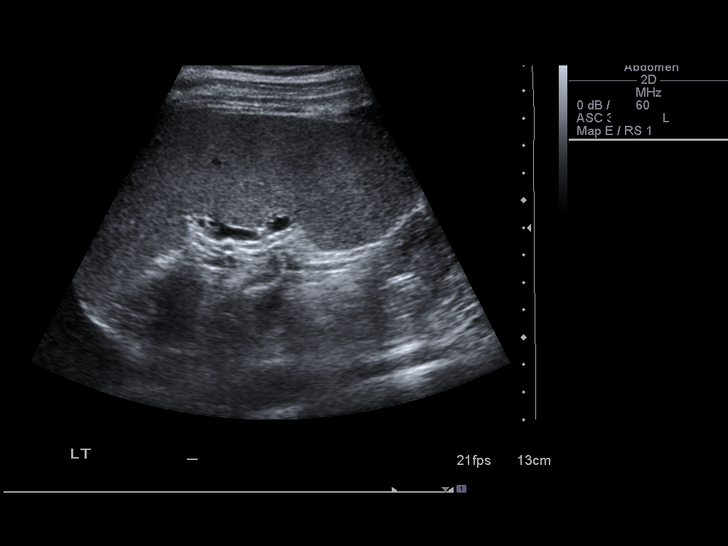
[im 61/67]
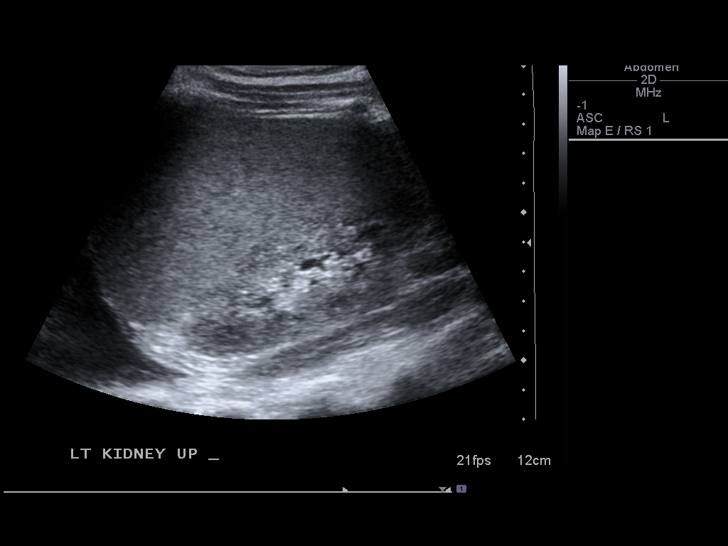
[im 67/67]
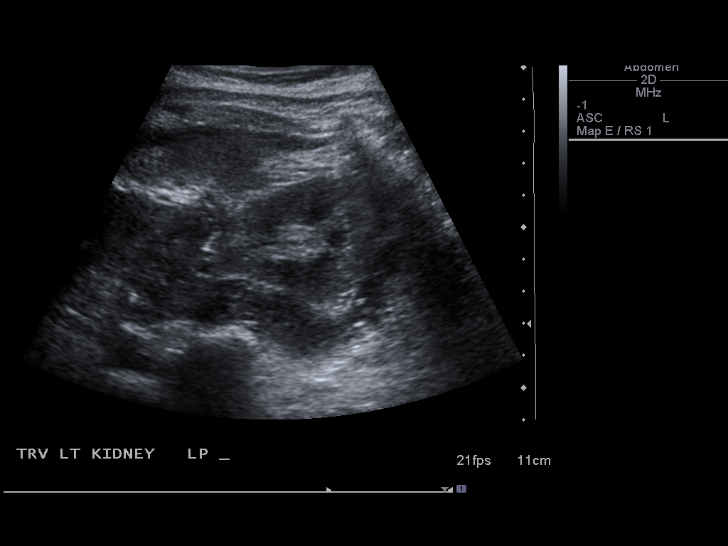

[14 of 25 positions shown; findings below may reference images not displayed]

FINDINGS: Gallbladder:  Normal, without wall thickening, stone, or
pericholecystic fluid.  Sonographic Murphy's sign was not elicited.

Common bile duct: Normal, 4 mm.

Liver: Normal in echogenicity, without focal lesion.

IVC: Negative

Pancreas:  Negative

Spleen:  Splenomegaly.  Splenic size of 13.8 cm and splenic volume
of 660 ml.

Right Kidney:  12.2 cm.  Increased echogenicity.  Trace edema or
fluid adjacent the right kidney on image 44.

Left Kidney:  10.4 cm.  Mildly increased echogenicity. No
hydronephrosis.

Abdominal aorta:  No aneurysm.

Note is made of bilateral pleural effusions.
IMPRESSION: 1.  Normal appearance of the gallbladder and biliary tract.
2.  Splenomegaly.
3.  Bilateral pleural effusions.
4.  Increased renal echogenicity, likely representing medical renal
disease.
5.  Suspect trace fluid or edema adjacent the right kidney.  No
fluid collection identified.  No explanation for right flank pain.

## 2011-11-25 MED ORDER — OXYCODONE HCL 5 MG PO TABS
ORAL_TABLET | ORAL | Status: AC
  Administered 2011-11-25: 5 mg via ORAL
  Filled 2011-11-25: qty 1

## 2011-11-25 MED ORDER — MORPHINE SULFATE 2 MG/ML IJ SOLN
INTRAMUSCULAR | Status: AC
  Administered 2011-11-25: 2 mg via INTRAVENOUS
  Filled 2011-11-25: qty 1

## 2011-11-25 NOTE — Progress Notes (Signed)
[facility] KIDNEY PROGRESS NOTE Resident Note   Please see below for attending addendum to resident note.  Subjective:   Patient reports about significant  Right flank pain now not controlled with pain medication. Due to pain patient is not taking any deep breath. Noted that her suprapubic pain has improved. Urine output continues to be low but denies any blood in the urine    Objective:    Vital Signs:   Temp:  [97.5 F (36.4 C)-98.8 F (37.1 C)] 98.8 F (37.1 C) (03/23 0540) Pulse Rate:  [87-105] 105  (03/23 0730) Resp:  [18] 18  (03/23 0540) BP: (137-160)/(72-88) 155/88 mmHg (03/23 0730) SpO2:  [92 %-99 %] 92 % (03/23 0540) Weight:  [117 lb 11.6 oz (53.4 kg)] 117 lb 11.6 oz (53.4 kg) (03/22 2135) Last BM Date: 11/24/11  24-hour weight change: Weight change: 10.6 oz (0.3 kg)  Weight trends: Filed Weights   11/23/11 1100 11/23/11 2117 11/24/11 2135  Weight: 117 lb 1 oz (53.1 kg) 117 lb 11.6 oz (53.4 kg) 117 lb 11.6 oz (53.4 kg)    Intake/Output:  03/22 0701 - 03/23 0700 In: 600 [P.O.:600] Out: 325 [Urine:325]  Physical Exam: General: Vital signs reviewed and noted. Well-developed, well-nourished, in no acute distress; alert, appropriate and cooperative throughout examination.  Lungs:  Normal respiratory effort. Decreased breath sounds at the basis.   Heart: RRR. S1 and S2 normal   Abdomen:  BS normoactive. Soft, Nondistended, Significant right flank tenderness on palpation. Worse compared to yesterday. No suprapubic tenderness.   Extremities: No pretibial edema.     Labs: Basic Metabolic Panel:  Lab [id number] 0731 11/23/11 0728 11/21/11 0500 11/19/11 1911 11/18/11 1941  NA 137 138 137 135 137  K 4.0 3.6 4.3 4.3 4.4  CL 101 99 99 100 102  CO2 26 28 28 25 23   GLUCOSE 133* 170* 172* 136* 102*  BUN 27* 27* 22 27* 41*  CREATININE 5.54* 5.39* 5.16* 5.17* 6.36*  CALCIUM 8.8 8.7 8.7 -- --  MG -- -- -- -- --  PHOS 3.8 4.2 4.4 5.3* 6.5*    Liver Function  Tests:  Lab 11/25/11 0731 11/23/11 0728 11/21/11 0500 11/19/11 1911 11/18/11 1941  AST -- -- -- -- --  ALT -- -- -- -- --  ALKPHOS -- -- -- -- --  BILITOT -- -- -- -- --  PROT -- -- -- -- --  ALBUMIN 2.2* 2.2* 2.1* 2.2* 2.3*    CBC:  Lab 11/25/11 0731 11/23/11 0728 11/21/11 0500 11/18/11 1941  WBC 8.0 7.9 7.7 8.7  NEUTROABS -- -- -- --  HGB 9.6* 9.5* 9.9* 9.8*  HCT 28.9* 28.5* 29.8* 29.6*  MCV 88.9 87.7 90.0 89.4  PLT 162 173 178 172    CBG:  Lab 11/24/11 2058 11/24/11 1639 11/24/11 1226 11/24/11 0804 11/24/11 0403  GLUCAP 83 122* 146* 179* 148*    Microbiology: Results for orders placed during the hospital encounter of 11/15/11  SURGICAL PCR SCREEN     Status: Normal   Collection Time   11/16/11  4:54 AM      Component Value Range Status Comment   MRSA, PCR NEGATIVE  NEGATIVE  Final    Staphylococcus aureus NEGATIVE  NEGATIVE  Final     Urinalysis:  Basename 11/24/11 0142  COLORURINE YELLOW  LABSPEC 1.030  PHURINE 6.5  GLUCOSEU 250*  HGBUR TRACE*  BILIRUBINUR NEGATIVE  KETONESUR 15*  PROTEINUR >300*  UROBILINOGEN 0.2  NITRITE NEGATIVE  LEUKOCYTESUR NEGATIVE  Imaging: Dg Chest 2 View  11/24/2011  *RADIOLOGY REPORT*  Clinical Data: Chest pain, shortness of breath, pleural effusion  CHEST - 2 VIEW  Comparison: 11/10/2011  Findings: Cardiomegaly.  No frank interstitial edema.  Small bilateral pleural effusions, left greater than right.  Associated bilateral lower lobe opacities, likely atelectasis.  No pneumothorax.  Right sided tunneled dialysis catheter.  IMPRESSION: Cardiomegaly with bilateral pleural effusions, left greater than right.  No frank interstitial edema.  Associated bilateral lower lobe opacities, likely atelectasis.  Original Report Authenticated By: Julian Hy, M.D.      Medications:    Infusions:    . dextrose 10 mL (11/16/11 1113)    Scheduled Medications:    . calcium carbonate  1 tablet Oral TID WC  . ciprofloxacin   500 mg Oral q1800  . darbepoetin (ARANESP) injection - DIALYSIS  100 mcg Intravenous Q Thu-HD  . diltiazem  240 mg Oral Daily  . ferric gluconate (FERRLECIT/NULECIT) IV  125 mg Intravenous Q T,Th,Sa-HD  . hydrALAZINE  50 mg Oral BID  . insulin aspart  0-9 Units Subcutaneous TID WC  . insulin aspart  4 Units Subcutaneous TID WC  . insulin glargine  3 Units Subcutaneous QHS  . levothyroxine  50 mcg Oral Q breakfast  . lisinopril  10 mg Oral QHS  . multivitamin  1 tablet Oral QHS  . pantoprazole  40 mg Oral BID  . sodium chloride  3 mL Intravenous Q12H  . sodium chloride  3 mL Intravenous Q12H  . DISCONTD: ciprofloxacin  250 mg Oral BID    PRN Medications: sodium chloride, sodium chloride, sodium chloride, acetaminophen, acetaminophen, albuterol, dextrose, feeding supplement (NEPRO CARB STEADY), guaiFENesin-dextromethorphan, heparin, heparin, HYDROcodone-acetaminophen, lidocaine, lidocaine-prilocaine, metoprolol, morphine, ondansetron (ZOFRAN) IV, oxyCODONE, pentafluoroprop-tetrafluoroeth, sodium chloride   Assessment/ Plan:    Pt is a 31 y.o. yo female with a PMHx of DM, Hypertension, CKD who was admitted on 11/15/2011 with Uremia. Renal service is consulted for evaluation and management of ESRD .   1. 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 Scheduled for HD today .  Was accepted at [place] for TTS schedule. Patient will receive HD today and then can  receive HD on 3/26 as an outpatient.   2. Anemia - stable. Currently on Aranesp and ferric gluconate .   3. Pleural/pericardial effusion likely secondary to Uremia. 2D-Echo showed persistent pericardial effusion despite several session of HD. No Paracardial centesis per Cardiology at this point. CXray showed improving bilateral pleural effusions, left greater than right.   4. Hypertension- Mildly elevated BP. Currently on Diltiazem and Hydralazine as well as Lisinopril which was started 3/21. Adjusted as an outpatient.     5. Abdominal pain/Nausea /Vomiting: UA negative for acute infection. Right flank pain unclear etiology concerning for Nephrolithiasis.  Patient has increased risk for infection due to Diabetes and new ESRD. Was started on Cipro bid for 5 days.  - will obtain abdominal ultrasound    Length of Stay: 10 days  Patient history and plan of care reviewed with attending, Dr. Elmarie Shiley.   Rosalia Hammers, MD PGYII, Internal Medicine Resident 11/25/2011, 8:43 AM  I have seen and examined this patient and agree with the assessment/plan as outlined above by Newt Lukes MD (PGY2). Anticipated DC today but she complains of severe RUQ/Flank pain without other urinary symptoms. Plan to do a RUQ [place] to r/o cholecystitis.  Earnest Mcgillis K.,MD 11/25/2011 11:37 AM

## 2011-11-25 NOTE — Procedures (Signed)
I was present at this session.  I have reviewed the session itself and made appropriate changes.  [name] K. 3/23/20139:26 AM

## 2011-11-25 NOTE — Progress Notes (Signed)
Subjective:  Still with R flank pain RUQ U/S P Hold D/C until work up complete   Objective: Vital signs in last 24 hours: Filed Vitals:   11/25/11 0930 11/25/11 1000 11/25/11 1030 11/25/11 1100  BP: 141/80 142/82 142/90 156/92  Pulse: 109 107 108 110  Temp:      TempSrc:      Resp:      Height:      Weight:      SpO2:       Weight change: 0.3 kg (10.6 oz)  Intake/Output Summary (Last 24 hours) at 11/25/11 1210 Last data filed at 11/25/11 0541  Gross per 24 hour  Intake    360 ml  Output    325 ml  Net     35 ml    Physical Exam: General: Awake, Oriented, No acute distress. HEENT: EOMI. Neck: Supple, catheter in place CV: S1 and S2, rrr Lungs: coarse B/S B/L, no wheezing- cleared some with cough Abdomen: Soft, + CVA tenderness, negative murphey's sign, Nondistended, +bowel sounds. Ext: Good pulses. No edema.   Lab Results:  [facility] 11/25/11 0731 11/23/11 0728  NA 137 138  K 4.0 3.6  CL 101 99  CO2 26 28  GLUCOSE 133* 170*  BUN 27* 27*  CREATININE 5.54* 5.39*  CALCIUM 8.8 8.7  MG -- --  PHOS 3.8 4.2    Basename 11/25/11 0731 11/23/11 0728  AST -- --  ALT -- --  ALKPHOS -- --  BILITOT -- --  PROT -- --  ALBUMIN 2.2* 2.2*   No results found for this basename: LIPASE:2,AMYLASE:2 in the last 72 hours  Basename 11/25/11 0731 11/23/11 0728  WBC 8.0 7.9  NEUTROABS -- --  HGB 9.6* 9.5*  HCT 28.9* 28.5*  MCV 88.9 87.7  PLT 162 173   No results found for this basename: CKTOTAL:3,CKMB:3,CKMBINDEX:3,TROPONINI:3 in the last 72 hours No components found with this basename: POCBNP:3 No results found for this basename: DDIMER:2 in the last 72 hours No results found for this basename: HGBA1C:2 in the last 72 hours No results found for this basename: CHOL:2,HDL:2,LDLCALC:2,TRIG:2,CHOLHDL:2,LDLDIRECT:2 in the last 72 hours No results found for this basename: TSH,T4TOTAL,FREET3,T3FREE,THYROIDAB in the last 72 hours No results found for this basename:  VITAMINB12:2,FOLATE:2,FERRITIN:2,TIBC:2,IRON:2,RETICCTPCT:2 in the last 72 hours  Micro Results: Recent Results (from the past 240 hour(s))  SURGICAL PCR SCREEN     Status: Normal   Collection Time   11/16/11  4:54 AM      Component Value Range Status Comment   MRSA, PCR NEGATIVE  NEGATIVE  Final    Staphylococcus aureus NEGATIVE  NEGATIVE  Final     Studies/Results: Dg Chest 2 View  11/24/2011  *RADIOLOGY REPORT*  Clinical Data: Chest pain, shortness of breath, pleural effusion  CHEST - 2 VIEW  Comparison: 11/10/2011  Findings: Cardiomegaly.  No frank interstitial edema.  Small bilateral pleural effusions, left greater than right.  Associated bilateral lower lobe opacities, likely atelectasis.  No pneumothorax.  Right sided tunneled dialysis catheter.  IMPRESSION: Cardiomegaly with bilateral pleural effusions, left greater than right.  No frank interstitial edema.  Associated bilateral lower lobe opacities, likely atelectasis.  Original Report Authenticated By: Julian Hy, M.D.    Medications: I have reviewed the patient's current medications. Scheduled Meds:    . calcium carbonate  1 tablet Oral TID WC  . ciprofloxacin  500 mg Oral q1800  . darbepoetin (ARANESP) injection - DIALYSIS  100 mcg Intravenous Q Thu-HD  . diltiazem  240  mg Oral Daily  . ferric gluconate (FERRLECIT/NULECIT) IV  125 mg Intravenous Q T,Th,Sa-HD  . hydrALAZINE  50 mg Oral BID  . insulin aspart  0-9 Units Subcutaneous TID WC  . insulin aspart  4 Units Subcutaneous TID WC  . insulin glargine  3 Units Subcutaneous QHS  . levothyroxine  50 mcg Oral Q breakfast  . lisinopril  10 mg Oral QHS  . multivitamin  1 tablet Oral QHS  . pantoprazole  40 mg Oral BID  . sodium chloride  3 mL Intravenous Q12H  . sodium chloride  3 mL Intravenous Q12H   Continuous Infusions:    . 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 Fistula has been placed. Is waiting for the clipping process to be completed. Currently dialyzing with a temp HD catheter. Renal following. Received HD 3/19. And 3/21-   #2 Pericardial Effusion/pleural effusion: No signs of tamponade. Should be improving with HD. Followup ECHO done- cardiology following awaiting recs, pleural effusions not worsened  #3 diabetes- BS 83-122, SSI add novolog with meals, reorder small dose lantus- watch for hypoglycemia  #4 R flank pain- get AM labs, R UQ U/S pending- ask for R kidney U/S as well as has severe CVA tenderness    Dispo: DC home once work up complete   LOS: 10 days  Acelynn Dejonge, DO 11/25/2011, 12:10 PM

## 2011-11-26 LAB — COMPREHENSIVE METABOLIC PANEL
Alkaline Phosphatase: 64 U/L (ref 39–117)
BUN: 15 mg/dL (ref 6–23)
Chloride: 100 mEq/L (ref 96–112)
Creatinine, Ser: 4.28 mg/dL — ABNORMAL HIGH (ref 0.50–1.10)
GFR calc Af Amer: 15 mL/min — ABNORMAL LOW (ref >90)
GFR calc non Af Amer: 13 mL/min — ABNORMAL LOW (ref >90)
Glucose, Bld: 151 mg/dL — ABNORMAL HIGH (ref 70–99)
Potassium: 3.7 mEq/L (ref 3.5–5.1)
Total Bilirubin: 0.1 mg/dL — ABNORMAL LOW (ref 0.3–1.2)

## 2011-11-26 LAB — GLUCOSE, CAPILLARY: Glucose-Capillary: 124 mg/dL — ABNORMAL HIGH (ref 70–99)

## 2011-11-26 LAB — LIPASE, BLOOD: Lipase: 14 U/L (ref 11–59)

## 2011-11-26 LAB — CBC
MCHC: 33.3 g/dL (ref 30.0–36.0)
Platelets: 183 10*3/uL (ref 150–400)
RDW: 14.2 % (ref 11.5–15.5)

## 2011-11-26 MED ORDER — INSULIN GLARGINE 100 UNIT/ML ~~LOC~~ SOLN: 3.0000 [IU] | Freq: Every day | SUBCUTANEOUS | Status: DC

## 2011-11-26 MED ORDER — NEPRO/CARBSTEADY PO LIQD: 237.0000 mL | ORAL | Status: DC | PRN

## 2011-11-26 MED ORDER — PANTOPRAZOLE SODIUM 40 MG PO TBEC: 40.0000 mg | DELAYED_RELEASE_TABLET | Freq: Two times a day (BID) | ORAL | Status: DC

## 2011-11-26 MED ORDER — INSULIN PEN NEEDLE 31G X 8 MM MISC: 1.0000 | Freq: Two times a day (BID) | Status: DC

## 2011-11-26 MED ORDER — LISINOPRIL 10 MG PO TABS: 10.0000 mg | ORAL_TABLET | Freq: Every day | ORAL | Status: DC

## 2011-11-26 MED ORDER — HYDROCODONE-ACETAMINOPHEN 5-325 MG PO TABS: 1.0000 | ORAL_TABLET | ORAL | Status: AC | PRN

## 2011-11-26 MED ORDER — ONDANSETRON HCL 4 MG PO TABS: 4.0000 mg | ORAL_TABLET | Freq: Three times a day (TID) | ORAL | Status: AC | PRN

## 2011-11-26 MED ORDER — INSULIN ASPART 100 UNIT/ML ~~LOC~~ SOLN: 4.0000 [IU] | Freq: Three times a day (TID) | SUBCUTANEOUS | Status: DC

## 2011-11-26 MED ORDER — UNABLE TO FIND: Status: DC

## 2011-11-26 MED ORDER — CALCIUM CARBONATE ANTACID 500 MG PO CHEW: 1.0000 | CHEWABLE_TABLET | Freq: Three times a day (TID) | ORAL | Status: DC

## 2011-11-26 MED ORDER — CIPROFLOXACIN HCL 250 MG PO TABS
250.0000 mg | ORAL_TABLET | Freq: Two times a day (BID) | ORAL | Status: DC
  Administered 2011-11-26 – 2011-11-27 (×2): 250 mg via ORAL
  Filled 2011-11-26 (×4): qty 1

## 2011-11-26 MED ORDER — INSULIN ASPART 100 UNIT/ML ~~LOC~~ SOLN: 0.0000 [IU] | Freq: Three times a day (TID) | SUBCUTANEOUS | Status: DC

## 2011-11-26 MED ORDER — CIPROFLOXACIN HCL 250 MG PO TABS: 250.0000 mg | ORAL_TABLET | Freq: Two times a day (BID) | ORAL | Status: AC

## 2011-11-26 NOTE — Progress Notes (Signed)
[id number] Encouraged patient to walk in hallways 2x 1st she said she's in pain so pain med given; other instance she said she walked  in room then she felt nauseated med given as well. Encouraged patient to walk later on.

## 2011-11-26 NOTE — Progress Notes (Signed)
[facility]ESS NOTE Resident Note   Please see below for attending addendum to resident note.  Subjective:   There were no events overnight. Currently, the patient confirms symptoms of right flank pain stable from yesterday, but has not improved significantly. Also had episode of nausea this Am without vomiting. She denies symptoms of fevers, chills, shortness of breath, chest pain.   Objective:    Vital Signs:   Temp:  [98.1 F (36.7 C)-99.3 F (37.4 C)] 98.8 F (37.1 C) (03/24 0512) Pulse Rate:  [90-115] 98  (03/24 0512) Resp:  [18] 18  (03/24 0512) BP: (132-156)/(73-92) 139/73 mmHg (03/24 0512) SpO2:  [92 %-96 %] 96 % (03/24 0512) Weight:  [113 lb 5.1 oz (51.4 kg)] 113 lb 5.1 oz (51.4 kg) (03/23 2155) Last BM Date: 11/24/11  24-hour weight change: Weight change: -4 lb 6.6 oz (-2 kg)  Weight trends: Filed Weights   11/25/11 0640 11/25/11 1120 11/25/11 2155  Weight: 120 lb 2.4 oz (54.5 kg) 113 lb 5.1 oz (51.4 kg) 113 lb 5.1 oz (51.4 kg)    Intake/Output:  03/23 0701 - 03/24 0700 In: 960 [P.O.:960] Out: 3100 [Urine:100]  Physical Exam: General: Vital signs reviewed and noted. Well-developed, well-nourished, in no acute distress; alert, appropriate and cooperative throughout examination.  Lungs:  Normal respiratory effort. Decreased breath sounds at the basis.   Heart: RRR. S1 and S2 normal. (+) murmur.   Abdomen:  BS normoactive. Soft, Nondistended, Significant right flank tenderness on palpation. No suprapubic tenderness.   Extremities: No pretibial edema.     Labs: Basic Metabolic Panel:  Lab [id number] 11/23/11 0728 11/21/11 0500 11/19/11 1911  NA 137 138 137 135  K 4.0 3.6 4.3 4.3  CL 101 99 99 100  CO2 26 28 28 25   GLUCOSE 133* 170* 172* 136*  BUN 27* 27* 22 27*  CREATININE 5.54* 5.39* 5.16* 5.17*  CALCIUM 8.8 8.7 8.7 --  MG -- -- -- --  PHOS 3.8 4.2 4.4 5.3*   Liver Function Tests:  Lab 11/25/11 0731 11/23/11 0728 11/21/11 0500  11/19/11 1911  AST -- -- -- --  ALT -- -- -- --  ALKPHOS -- -- -- --  BILITOT -- -- -- --  PROT -- -- -- --  ALBUMIN 2.2* 2.2* 2.1* 2.2*   CBC:  Lab 11/26/11 0805 11/25/11 0731 11/23/11 0728 11/21/11 0500  WBC 7.1 8.0 7.9 7.7  NEUTROABS -- -- -- --  HGB 10.6* 9.6* 9.5* 9.9*  HCT 31.8* 28.9* 28.5* 29.8*  MCV 90.3 88.9 87.7 90.0  PLT 183 162 173 178   CBG:  Lab 11/26/11 0743 11/25/11 2059 11/25/11 1649 11/25/11 1224 11/24/11 2058  GLUCAP 146* 97 145* 104* 80   Microbiology: Results for orders placed during the hospital encounter of 11/15/11  SURGICAL PCR SCREEN     Status: Normal   Collection Time   11/16/11  4:54 AM      Component Value Range Status Comment   MRSA, PCR NEGATIVE  NEGATIVE  Final    Staphylococcus aureus NEGATIVE  NEGATIVE  Final    Urinalysis:  Basename 11/24/11 0142  COLORURINE YELLOW  LABSPEC 1.030  PHURINE 6.5  GLUCOSEU 250*  HGBUR TRACE*  BILIRUBINUR NEGATIVE  KETONESUR 15*  PROTEINUR >300*  UROBILINOGEN 0.2  NITRITE NEGATIVE  LEUKOCYTESUR NEGATIVE     Imaging: US Abdomen Complete  11/25/2011  *RADIOLOGY REPORT*  Clinical Data:  Right upper quadrant abdominal pain. Right flank pain.  Rule out abscess.  COMPLETE  ABDOMINAL ULTRASOUND  Comparison:  11/15/2011 and CT of 04/22/2010.  Findings:  Gallbladder:  Normal, without wall thickening, stone, or pericholecystic fluid.  Sonographic Murphy's sign was not elicited.  Common bile duct: Normal, 4 mm.  Liver: Normal in echogenicity, without focal lesion.  IVC: Negative  Pancreas:  Negative  Spleen:  Splenomegaly.  Splenic size of 13.8 cm and splenic volume of 660 ml.  Right Kidney:  12.2 cm.  Increased echogenicity.  Trace edema or fluid adjacent the right kidney on image 44.  Left Kidney:  10.4 cm.  Mildly increased echogenicity. No hydronephrosis.  Abdominal aorta:  No aneurysm.  Note is made of bilateral pleural effusions.  IMPRESSION:  1.  Normal appearance of the gallbladder and biliary tract. 2.   Splenomegaly. 3.  Bilateral pleural effusions. 4.  Increased renal echogenicity, likely representing medical renal disease. 5.  Suspect trace fluid or edema adjacent the right kidney.  No fluid collection identified.  No explanation for right flank pain.  Original Report Authenticated By: Areta Haber, M.D.     Medications:    Infusions:    . dextrose 10 mL (11/16/11 1113)    Scheduled Medications:    . calcium carbonate  1 tablet Oral TID WC  . ciprofloxacin  500 mg Oral q1800  . darbepoetin (ARANESP) injection - DIALYSIS  100 mcg Intravenous Q Thu-HD  . diltiazem  240 mg Oral Daily  . ferric gluconate (FERRLECIT/NULECIT) IV  125 mg Intravenous Q T,Th,Sa-HD  . hydrALAZINE  50 mg Oral BID  . insulin aspart  0-9 Units Subcutaneous TID WC  . insulin aspart  4 Units Subcutaneous TID WC  . insulin glargine  3 Units Subcutaneous QHS  . levothyroxine  50 mcg Oral Q breakfast  . lisinopril  10 mg Oral QHS  . multivitamin  1 tablet Oral QHS  . pantoprazole  40 mg Oral BID  . sodium chloride  3 mL Intravenous Q12H  . sodium chloride  3 mL Intravenous Q12H    PRN Medications: sodium chloride, sodium chloride, sodium chloride, acetaminophen, acetaminophen, albuterol, dextrose, feeding supplement (NEPRO CARB STEADY), guaiFENesin-dextromethorphan, heparin, heparin, HYDROcodone-acetaminophen, lidocaine, lidocaine-prilocaine, metoprolol, morphine, ondansetron (ZOFRAN) IV, oxyCODONE, pentafluoroprop-tetrafluoroeth, sodium chloride   Assessment/ Plan:    Pt is a 31 y.o. yo female with a PMHx of DM, Hypertension, CKD who was admitted on 11/15/2011 with Uremia. Renal service is consulted for evaluation and management of ESRD .   1. 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 No indication of uremia at this time. Was accepted at [facility] for TTS schedule.  - No need for emergent HD. - Plant to have next HD as  outpatient on 03/26 - however, if still inpatient at that point, will arrange for inpatient dialysis.  2. Anemia - stable. Currently on Aranesp and ferric gluconate .   3. Pleural/pericardial effusion likely secondary to Uremia. 2D-Echo showed persistent pericardial effusion despite several session of HD. No Paracardial centesis per Cardiology at this point. CXray showed improving bilateral pleural effusions, left greater than right.   4. Hypertension- Mildly elevated BP. Currently on Diltiazem and Hydralazine as well as Lisinopril which was started 3/21. Adjusted as an outpatient.    5. Right flank pain - unclear etiology. UA negative for fulminant acute infection, however 2/2 recent foley, ESRD, and diabetes is being treated empirically with Cipro x 5 days total. Abd [place] negative for cholelithiasis or  nephrolithiasis,does show some trace fluid or edema surrounding right kidney. - Symptomatic treatment. - Continue ciprofloxacin for total 5 days. - Renal ultrasound pending.  6. Nausea/vomiting - Stable this AM. Patient describes nausea, vomiting ongoing for at least 1-1.5 months. Likely initially secondary to uremia as patient progressed from CKD to ESRD. I wonder if there is a component of gastroparesis in the setting of her diabetes that was previously very poorly controlled. States she has never had a gastric emptying study. Versus possible GERD. Acute etiology also being investigated. - per primary service.  Length of Stay: 11 days  Patient history and plan of care reviewed with attending, Dr. Elmarie Shiley.   Chilton Greathouse, D.ODesmond Lope, Internal Medicine Resident 11/26/2011, 9:25 AM  I have seen and examined this patient and agree with the assessment/plan as outlined above by Kalia-Reynolds DO (PGY2). RUQ/flank pain eval with [place] does not show cholecystitis and may be indicative of mild pyelonephritis. She is on appropriate ABX therapy that she can take in/tolerate PO without features  of sepsis. Possible to DC home today/tomorrow to start HD on TTS schedule at [facility]. Vaughan Garfinkle K.,MD 11/26/2011 11:26 AM

## 2011-11-26 NOTE — Progress Notes (Addendum)
Subjective:  Still with R flank pain but better   Objective: Vital signs in last 24 hours: Filed Vitals:   11/25/11 1700 11/25/11 2155 11/26/11 0512 11/26/11 0925  BP: 145/78 132/74 139/73 148/71  Pulse: 106 90 98 108  Temp: 98.1 F (36.7 C) 98.3 F (36.8 C) 98.8 F (37.1 C) 98.5 F (36.9 C)  TempSrc: Oral Oral Oral Oral  Resp: 18 18 18 18   Height:  5\' 2"  (1.575 m)    Weight:  51.4 kg (113 lb 5.1 oz)    SpO2: 93% 95% 96% 96%   Weight change: -2 kg (-4 lb 6.6 oz)  Intake/Output Summary (Last 24 hours) at 11/26/11 1101 Last data filed at 11/26/11 0900  Gross per 24 hour  Intake   1200 ml  Output   3100 ml  Net  -1900 ml    Physical Exam: General: Awake, Oriented, No acute distress. HEENT: EOMI. Neck: Supple, catheter in place CV: S1 and S2, rrr Lungs: coarse B/S B/L, no wheezing- cleared some with cough Abdomen: Soft, + CVA tenderness, negative murphey's sign, Nondistended, +bowel sounds. Ext: Good pulses. No edema.   Lab Results:  [facility] 11/26/11 0805 11/25/11 0731  NA 139 137  K 3.7 4.0  CL 100 101  CO2 27 26  GLUCOSE 151* 133*  BUN 15 27*  CREATININE 4.28* 5.54*  CALCIUM 8.9 8.8  MG -- --  PHOS -- 3.8    Basename 11/26/11 0805 11/25/11 0731  AST 12 --  ALT 7 --  ALKPHOS 64 --  BILITOT 0.1* --  PROT 6.1 --  ALBUMIN 2.4* 2.2*    Basename 11/26/11 0805  LIPASE 14  AMYLASE --    Basename 11/26/11 0805 11/25/11 0731  WBC 7.1 8.0  NEUTROABS -- --  HGB 10.6* 9.6*  HCT 31.8* 28.9*  MCV 90.3 88.9  PLT 183 162   No results found for this basename: CKTOTAL:3,CKMB:3,CKMBINDEX:3,TROPONINI:3 in the last 72 hours No components found with this basename: POCBNP:3 No results found for this basename: DDIMER:2 in the last 72 hours No results found for this basename: HGBA1C:2 in the last 72 hours No results found for this basename: CHOL:2,HDL:2,LDLCALC:2,TRIG:2,CHOLHDL:2,LDLDIRECT:2 in the last 72 hours No results found for this basename:  TSH,T4TOTAL,FREET3,T3FREE,THYROIDAB in the last 72 hours No results found for this basename: VITAMINB12:2,FOLATE:2,FERRITIN:2,TIBC:2,IRON:2,RETICCTPCT:2 in the last 72 hours  Micro Results: No results found for this or any previous visit (from the past 240 hour(s)).  Studies/Results: US Abdomen Complete  11/25/2011  *RADIOLOGY REPORT*  Clinical Data:  Right upper quadrant abdominal pain. Right flank pain.  Rule out abscess.  COMPLETE ABDOMINAL ULTRASOUND  Comparison:  11/15/2011 and CT of 04/22/2010.  Findings:  Gallbladder:  Normal, without wall thickening, stone, or pericholecystic fluid.  Sonographic Murphy's sign was not elicited.  Common bile duct: Normal, 4 mm.  Liver: Normal in echogenicity, without focal lesion.  IVC: Negative  Pancreas:  Negative  Spleen:  Splenomegaly.  Splenic size of 13.8 cm and splenic volume of 660 ml.  Right Kidney:  12.2 cm.  Increased echogenicity.  Trace edema or fluid adjacent the right kidney on image 44.  Left Kidney:  10.4 cm.  Mildly increased echogenicity. No hydronephrosis.  Abdominal aorta:  No aneurysm.  Note is made of bilateral pleural effusions.  IMPRESSION:  1.  Normal appearance of the gallbladder and biliary tract. 2.  Splenomegaly. 3.  Bilateral pleural effusions. 4.  Increased renal echogenicity, likely representing medical renal disease. 5.  Suspect trace fluid or edema  adjacent the right kidney.  No fluid collection identified.  No explanation for right flank pain.  Original Report Authenticated By: Areta Haber, M.D.    Medications: I have reviewed the patient's current medications. Scheduled Meds:    . calcium carbonate  1 tablet Oral TID WC  . ciprofloxacin  500 mg Oral q1800  . darbepoetin (ARANESP) injection - DIALYSIS  100 mcg Intravenous Q Thu-HD  . diltiazem  240 mg Oral Daily  . ferric gluconate (FERRLECIT/NULECIT) IV  125 mg Intravenous Q T,Th,Sa-HD  . hydrALAZINE  50 mg Oral BID  . insulin aspart  0-9 Units Subcutaneous TID WC    . insulin aspart  4 Units Subcutaneous TID WC  . insulin glargine  3 Units Subcutaneous QHS  . levothyroxine  50 mcg Oral Q breakfast  . lisinopril  10 mg Oral QHS  . multivitamin  1 tablet Oral QHS  . pantoprazole  40 mg Oral BID  . sodium chloride  3 mL Intravenous Q12H  . sodium chloride  3 mL Intravenous Q12H   Continuous Infusions:    . 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 Fistula has been placed. Set up for outpatient dialysis  #2 Pericardial Effusion/pleural effusion: No signs of tamponade. Should be improving with HD.   #3 diabetes- BS 83-122, SSI add novolog with meals, reorder small dose lantus- watch for hypoglycemia  #4 R flank pain- ?infection, continue cipro- outpatient gastric emptying study   D/C home today  ADDENDUM: Patient's discharge delayed as she does not have a ride.   Was offered a taxi voucher but now does not have a key to her apartment. Plan to D/C tomm when she states she will have all available to her.    LOS: 11 days  Oluwatosin Higginson, DO 11/26/2011, 11:01 AM

## 2011-11-26 NOTE — Discharge Summary (Addendum)
Discharge Summary  [name] [name] MR#: [id number]  DOB:1981/07/21  Date of Admission: 11/15/2011 Date of Discharge: 11/27/2011  Patient's PCP: Mack Hook, MD, MD  Attending Physician:Bhakti Labella  Consults: Treatment Team:  Clayborne Dana. 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 She has chronic abdominal pain that is more like cramps. She have had nausea and vomiting daily. No hematoemesis. No blood in stool. She has chills but no fever. Some shortness of breath no worse than baseline. No chest pains. Have had swelling for months.   Discharge Medications Medication List  As of 11/27/2011 12:33 PM   STOP taking these medications         carvedilol 25 MG tablet      furosemide 40 MG tablet      insulin lispro protamine-insulin lispro (75-25) 100 UNIT/ML Susp      oxyCODONE-acetaminophen 5-325 MG per tablet         TAKE these medications         calcium carbonate 500 MG chewable tablet   Commonly known as: TUMS - dosed in mg elemental calcium   Chew 1 tablet (200 mg of elemental calcium total) by mouth 3 (three) times daily with meals.      ciprofloxacin 250 MG tablet   Commonly known as: CIPRO   Take 1 tablet (250 mg total) by mouth 2 (two) times daily.      diltiazem 240 MG 24 hr capsule   Commonly known as: CARDIZEM CD   Take 240 mg by mouth daily.      feeding supplement (NEPRO CARB STEADY) Liqd   Take 237 mLs by mouth as needed (missed meal during dialysis.).      hydrALAZINE 50 MG tablet   Commonly known as: APRESOLINE   Take 50 mg by mouth 2 (two) times daily.     HYDROcodone-acetaminophen 5-325 MG per tablet   Commonly known as: NORCO   Take 1-2 tablets by mouth every 4 (four) hours as needed.      insulin aspart 100 UNIT/ML injection   Commonly known as: novoLOG   Inject 0-9 Units into the skin 3 (three) times daily with meals.      insulin aspart 100 UNIT/ML injection   Commonly known as: novoLOG   Inject 4 Units into the skin 3 (three) times daily with meals.      insulin glargine 100 UNIT/ML injection   Commonly known as: LANTUS   Inject 3 Units into the skin at bedtime.      Insulin Pen Needle 31G X 8 MM Misc   1 each by Does not apply route 2 (two) times daily.      levothyroxine 50 MCG tablet   Commonly known as: SYNTHROID, LEVOTHROID   Take 50 mcg by mouth daily.      lisinopril 10 MG tablet   Commonly known as: PRINIVIL,ZESTRIL   Take 1 tablet (10 mg total) by mouth at bedtime.      ondansetron 4 MG tablet   Commonly known as: ZOFRAN   Take 1 tablet (4 mg total) by mouth every 8 (eight) hours as needed.  pantoprazole 40 MG tablet   Commonly known as: PROTONIX   Take 1 tablet (40 mg total) by mouth 2 (two) times daily.      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 Fistula has been placed. Set up for outpatient dialysis  2 Pericardial Effusion/pleural effusion: No signs of tamponade. improving with HD.  3 diabetes- BS 83-122, SSI add novolog with meals, reorder small dose lantus- watch for hypoglycemia  4 R flank pain- ?infection, continue cipro for full course   5. Nausea- outpatient gastric emptying study vs uremia- watch    Day of Discharge BP 153/83  Pulse 99  Temp(Src) 98.2 F (36.8 C) (Oral)  Resp 18  Ht 5\' 2"  (1.575 m)  Wt 51.4 kg (113 lb 5.1 oz)  BMI 20.73 kg/m2  SpO2 96%  LMP 11/03/2011 A+o x3  NAD Still with some CVA tenderness Clear ant  Results  for orders placed during the hospital encounter of 11/15/11 (from the past 48 hour(s))  GLUCOSE, CAPILLARY     Status: Abnormal   Collection Time   11/25/11  4:49 PM      Component Value Range Comment   Glucose-Capillary 145 (*) 70 - 99 (mg/dL)   GLUCOSE, CAPILLARY     Status: Normal   Collection Time   11/25/11  8:59 PM      Component Value Range Comment   Glucose-Capillary 97  70 - 99 (mg/dL)   GLUCOSE, CAPILLARY     Status: Abnormal   Collection Time   11/26/11  7:43 AM      Component Value Range Comment   Glucose-Capillary 146 (*) 70 - 99 (mg/dL)   COMPREHENSIVE METABOLIC PANEL     Status: Abnormal   Collection Time   11/26/11  8:05 AM      Component Value Range Comment   Sodium 139  135 - 145 (mEq/L)    Potassium 3.7  3.5 - 5.1 (mEq/L)    Chloride 100  96 - 112 (mEq/L)    CO2 27  19 - 32 (mEq/L)    Glucose, Bld 151 (*) 70 - 99 (mg/dL)    BUN 15  6 - 23 (mg/dL)    Creatinine, Ser 4.28 (*) 0.50 - 1.10 (mg/dL)    Calcium 8.9  8.4 - 10.5 (mg/dL)    Total Protein 6.1  6.0 - 8.3 (g/dL)    Albumin 2.4 (*) 3.5 - 5.2 (g/dL)    AST 12  0 - 37 (U/L)    ALT 7  0 - 35 (U/L)    Alkaline Phosphatase 64  39 - 117 (U/L)    Total Bilirubin 0.1 (*) 0.3 - 1.2 (mg/dL)    GFR calc non Af Amer 13 (*) >90 (mL/min)    GFR calc Af Amer 15 (*) >90 (mL/min)   LIPASE, BLOOD     Status: Normal   Collection Time   11/26/11  8:05 AM      Component Value Range Comment   Lipase 14  11 - 59 (U/L)   CBC     Status: Abnormal   Collection Time   11/26/11  8:05 AM      Component Value Range Comment   WBC 7.1  4.0 - 10.5 (K/uL)    RBC 3.52 (*) 3.87 - 5.11 (MIL/uL)  Hemoglobin 10.6 (*) 12.0 - 15.0 (g/dL)    HCT 31.8 (*) 36.0 - 46.0 (%)    MCV 90.3  78.0 - 100.0 (fL)    MCH 30.1  26.0 - 34.0 (pg)    MCHC 33.3  30.0 - 36.0 (g/dL)    RDW 14.2  11.5 - 15.5 (%)    Platelets 183  150 - 400 (K/uL)   GLUCOSE, CAPILLARY     Status: Abnormal   Collection Time   11/26/11 11:59 AM      Component Value Range  Comment   Glucose-Capillary 100 (*) 70 - 99 (mg/dL)   GLUCOSE, CAPILLARY     Status: Abnormal   Collection Time   11/26/11  5:09 PM      Component Value Range Comment   Glucose-Capillary 124 (*) 70 - 99 (mg/dL)   GLUCOSE, CAPILLARY     Status: Abnormal   Collection Time   11/26/11  8:38 PM      Component Value Range Comment   Glucose-Capillary 121 (*) 70 - 99 (mg/dL)   GLUCOSE, CAPILLARY     Status: Abnormal   Collection Time   11/27/11  7:54 AM      Component Value Range Comment   Glucose-Capillary 142 (*) 70 - 99 (mg/dL)   GLUCOSE, CAPILLARY     Status: Abnormal   Collection Time   11/27/11 11:31 AM      Component Value Range Comment   Glucose-Capillary 116 (*) 70 - 99 (mg/dL)     Dg Chest 2 View  11/24/2011  *RADIOLOGY REPORT*  Clinical Data: Chest pain, shortness of breath, pleural effusion  CHEST - 2 VIEW  Comparison: 11/10/2011  Findings: Cardiomegaly.  No frank interstitial edema.  Small bilateral pleural effusions, left greater than right.  Associated bilateral lower lobe opacities, likely atelectasis.  No pneumothorax.  Right sided tunneled dialysis catheter.  IMPRESSION: Cardiomegaly with bilateral pleural effusions, left greater than right.  No frank interstitial edema.  Associated bilateral lower lobe opacities, likely atelectasis.  Original Report Authenticated By: Julian Hy, M.D.   Dg Chest 2 View  11/10/2011  *RADIOLOGY REPORT*  Clinical Data: Weakness, shortness of breath, headache, nausea, vomiting, hypertension, diabetes  CHEST - 2 VIEW  Comparison: 10/30/2011  Findings: Enlargement of cardiac silhouette. Mediastinal contours and pulmonary vascularity normal. Bibasilar effusions and atelectasis increased since previous exam. Unable to exclude underlying infiltrates in lower lobes. Upper lungs clear. No pneumothorax. Bones unremarkable.  IMPRESSION: Enlargement of cardiac silhouette. Increased bibasilar effusions and atelectasis.  Original Report Authenticated By: Burnetta Sabin, M.D.   Dg Chest 2 View  10/30/2011  *RADIOLOGY REPORT*  Clinical Data: Cough and shortness of breath.  CHEST - 2 VIEW  Comparison: Chest x-ray 04/22/2010.  Findings: The heart is mildly enlarged.  The mediastinal and hilar contours are within normal limits.  There are bilateral pleural effusions and overlying atelectasis.  No edema or pneumothorax.  IMPRESSION: Bilateral pleural effusions with overlying atelectasis.  Original Report Authenticated By: P. Kalman Jewels, M.D.   Ct Head Wo Contrast  11/10/2011  *RADIOLOGY REPORT*  Clinical Data: Left side headache for 6-7 hours, nausea, vomiting, blurred vision  CT HEAD WITHOUT CONTRAST  Technique:  Contiguous axial images were obtained from the base of the skull through the vertex without contrast.  Comparison: None  Findings: Normal ventricular morphology. No midline shift or mass effect. Normal appearance of brain parenchyma. No intracranial hemorrhage, mass lesion, or acute infarction. Visualized paranasal sinuses and mastoid air cells clear.  Bones unremarkable.  IMPRESSION: No acute intracranial abnormalities.  Original Report Authenticated By: Burnetta Sabin, M.D.   US Abdomen Complete  11/25/2011  *RADIOLOGY REPORT*  Clinical Data:  Right upper quadrant abdominal pain. Right flank pain.  Rule out abscess.  COMPLETE ABDOMINAL ULTRASOUND  Comparison:  11/15/2011 and CT of 04/22/2010.  Findings:  Gallbladder:  Normal, without wall thickening, stone, or pericholecystic fluid.  Sonographic Murphy's sign was not elicited.  Common bile duct: Normal, 4 mm.  Liver: Normal in echogenicity, without focal lesion.  IVC: Negative  Pancreas:  Negative  Spleen:  Splenomegaly.  Splenic size of 13.8 cm and splenic volume of 660 ml.  Right Kidney:  12.2 cm.  Increased echogenicity.  Trace edema or fluid adjacent the right kidney on image 44.  Left Kidney:  10.4 cm.  Mildly increased echogenicity. No hydronephrosis.  Abdominal aorta:  No aneurysm.  Note is made of  bilateral pleural effusions.  IMPRESSION:  1.  Normal appearance of the gallbladder and biliary tract. 2.  Splenomegaly. 3.  Bilateral pleural effusions. 4.  Increased renal echogenicity, likely representing medical renal disease. 5.  Suspect trace fluid or edema adjacent the right kidney.  No fluid collection identified.  No explanation for right flank pain.  Original Report Authenticated By: Areta Haber, M.D.   US Abdomen Complete  11/15/2011  *RADIOLOGY REPORT*  Clinical Data:  Epigastric pain. Rule out biliary disease. Chronic renal failure. Type 1 diabetes.  ABDOMINAL ULTRASOUND COMPLETE  Comparison:  CT abdomen pelvis 02/20/2010  Findings:  Inferior chest:  A significant pericardial effusion is partially visualized.  Prominent bilateral pleural effusions are noted.  Gallbladder:  The gallbladder wall thickness measures slightly prominent at 3.5 mm.  No gallstones or sludge is seen.  No definite sonographic Murphy's sign is seen.  The sonographer reports the patient has generalized abdominal pain with palpation.  Common Bile Duct:  Within normal limits in caliber. Measures 2.9 mm.  Liver: No focal mass lesion identified.  Within normal limits in parenchymal echogenicity.  IVC:  Appears normal.  Pancreas:  Although the pancreas is difficult to visualize in its entirety, no focal pancreatic abnormality is identified.  Spleen:  Measures greater than 13.9 cm in length (cannot be completely imaged on one ultrasound screen).  Calculated splenic volume is estimated to be 600 cm cubed.  No focal splenic lesion.  Right kidney:  Normal in size.  Measures 12.1 cm.  Increased echogenicity of the right kidney. No evidence of mass or hydronephrosis.  Left kidney:  Normal in size.  Measures 12.2 cm.  Increased echogenicity of the left kidney.  No evidence of mass or hydronephrosis.  Abdominal Aorta:  No aneurysm identified.  No ascites is identified on these images.  IMPRESSION:  1.  Significant pericardial effusion  noted. 2.  Large bilateral pleural effusions. 3.  Mild diffuse gallbladder wall thickening.  Negative for stones or sludge.  Findings are nonspecific, and can be related to other entitities besides cholecystitis, such as hypoproteinemia, liver disease, pancreatitis, congestive heart failure, ascites, etc.eight calculus acute cholecystitis cannot be completely excluded; nuclear medicine imaging could be performed, if clinically indicated. 4.  Splenomegaly. 5.  Increased echogenicity of both kidneys consistent with history of chronic renal failure.  Original Report Authenticated By: Curlene Dolphin, M.D.   Ir Fluoro Guide Cv Line Right  11/16/2011  *RADIOLOGY REPORT*  Clinical Data: Acute renal failure requiring hemodialysis.  TUNNELED CENTRAL VENOUS HEMODIALYSIS CATHETER PLACEMENT WITH ULTRASOUND AND FLUOROSCOPIC GUIDANCE  Sedation:  1.5 mg IV Versed; 75 mcg IV Fentanyl.  Total Moderate Sedation Time:  30 minutes.  Additional Medications:  1 gram IV Ancef.  As antibiotic prophylaxis, Ancef 1 gm was ordered pre-procedure and administered intravenously within one hour of incision.  Fluoroscopy Time:  0.7 minutes.  Procedure:  The procedure, risks, benefits, and alternatives were explained to the patient.  Questions regarding the procedure were encouraged and answered.  The patient understands and consents to the procedure.  The right neck and chest were prepped with chlorhexidine in a sterile fashion, and a sterile drape was applied covering the operative field.  Maximum barrier sterile technique with sterile gowns and gloves were used for the procedure.  Local anesthesia was provided with 1% lidocaine.  After creating a small venotomy incision, a 21 gauge needle was advanced into the right internal jugular vein under direct, real- time ultrasound guidance.  Ultrasound image documentation was performed.  After securing guidewire access, an 8 Fr dilator was placed.  A J-wire was kinked to measure appropriate catheter  length.  A Bard HemoSplit tunneled hemodialysis catheter measuring 19 cm from tip to cuff was chosen for placement.  This was tunneled in a retrograde fashion from the chest wall to the venotomy incision.  At the venotomy, serial dilatation was performed and a 16 Fr peel- away sheath was placed over a guidewire.  The catheter was then placed through the sheath and the sheath removed.  Final catheter positioning was confirmed and documented with a fluoroscopic spot image.  The catheter was aspirated, flushed with saline, and injected with appropriate volume heparin dwells.  The venotomy incision was closed with subcutaneous 3-0 Monocryl and subcuticular 4-0 Vicryl.  Dermabond was applied to the incision. The catheter exit site was secured with 0-Prolene retention sutures.  Complications: None.  No pneumothorax.  Findings:  After catheter placement, the tips lie in the right atrium.  The catheter aspirates normally and is ready for immediate use.  IMPRESSION:  Placement of tunneled hemodialysis catheter via the right internal jugular vein.  The catheter tips lie in the right atrium.  The catheter is ready for immediate use.  Original Report Authenticated By: Azzie Roup, M.D.   Ir US Guide Vasc Access Right  11/16/2011  *RADIOLOGY REPORT*  Clinical Data: Acute renal failure requiring hemodialysis.  TUNNELED CENTRAL VENOUS HEMODIALYSIS CATHETER PLACEMENT WITH ULTRASOUND AND FLUOROSCOPIC GUIDANCE  Sedation:  1.5 mg IV Versed; 75 mcg IV Fentanyl.  Total Moderate Sedation Time:  30 minutes.  Additional Medications:  1 gram IV Ancef.  As antibiotic prophylaxis, Ancef 1 gm was ordered pre-procedure and administered intravenously within one hour of incision.  Fluoroscopy Time:  0.7 minutes.  Procedure:  The procedure, risks, benefits, and alternatives were explained to the patient.  Questions regarding the procedure were encouraged and answered.  The patient understands and consents to the procedure.  The right neck  and chest were prepped with chlorhexidine in a sterile fashion, and a sterile drape was applied covering the operative field.  Maximum barrier sterile technique with sterile gowns and gloves were used for the procedure.  Local anesthesia was provided with 1% lidocaine.  After creating a small venotomy incision, a 21 gauge needle was advanced into the right internal jugular vein under direct, real- time ultrasound guidance.  Ultrasound image documentation was performed.  After securing guidewire access, an 8 Fr dilator was placed.  A J-wire was kinked to measure appropriate catheter length.  A Bard HemoSplit tunneled hemodialysis catheter measuring  19 cm from tip to cuff was chosen for placement.  This was tunneled in a retrograde fashion from the chest wall to the venotomy incision.  At the venotomy, serial dilatation was performed and a 16 Fr peel- away sheath was placed over a guidewire.  The catheter was then placed through the sheath and the sheath removed.  Final catheter positioning was confirmed and documented with a fluoroscopic spot image.  The catheter was aspirated, flushed with saline, and injected with appropriate volume heparin dwells.  The venotomy incision was closed with subcutaneous 3-0 Monocryl and subcuticular 4-0 Vicryl.  Dermabond was applied to the incision. The catheter exit site was secured with 0-Prolene retention sutures.  Complications: None.  No pneumothorax.  Findings:  After catheter placement, the tips lie in the right atrium.  The catheter aspirates normally and is ready for immediate use.  IMPRESSION:  Placement of tunneled hemodialysis catheter via the right internal jugular vein.  The catheter tips lie in the right atrium.  The catheter is ready for immediate use.  Original Report Authenticated By: Azzie Roup, M.D.     Disposition: home with HD T/TH/SAT  Diet: renal  Activity:  As tolerated   Follow-up Appts: Discharge Orders    Future Appointments: Provider:  Department: Dept Phone: Center:   12/01/2011 12:30 PM [facility]  None   12/08/2011 12:30 PM [facility]  None   12/26/2011 10:45 AM Rosetta Posner, MD [facility] [phone] [facility]     Future Orders Please Complete By Expires   Increase activity slowly      Discharge instructions      Comments:   Dialysis- T/Th/Sat       TESTS THAT NEED FOLLOW-UP ?referral for gastric emptying  Time spent on discharge, talking to the patient, and coordinating care: 45 mins.   SignedEulogio Bear, DO 11/27/2011, 12:33 PM

## 2011-11-27 ENCOUNTER — Ambulatory Visit: Payer: PRIVATE HEALTH INSURANCE | Admitting: Surgery

## 2011-11-27 DIAGNOSIS — I319 Disease of pericardium, unspecified: Secondary | ICD-10-CM

## 2011-11-27 LAB — GLUCOSE, CAPILLARY: Glucose-Capillary: 116 mg/dL — ABNORMAL HIGH (ref 70–99)

## 2011-11-27 NOTE — Progress Notes (Signed)
[facility] PROGRESS NOTE Resident Note   Please see below for attending addendum to resident note.  Subjective:   Patient noted she feels better but continues to have pain the right flank area. She feels weak but has been mobilizing herself . Plan is for her to be d/c today. Denies any chest pain, SOB, abdominal pain.    Objective:    Vital Signs:   Temp:  [98.1 F (36.7 C)-99.1 F (37.3 C)] 98.1 F (36.7 C) (03/25 0523) Pulse Rate:  [95-108] 98  (03/25 0523) Resp:  [18] 18  (03/25 0523) BP: (136-153)/(71-81) 153/81 mmHg (03/25 0523) SpO2:  [93 %-96 %] 94 % (03/25 0523) Weight:  [113 lb 5.1 oz (51.4 kg)] 113 lb 5.1 oz (51.4 kg) (03/24 2108) Last BM Date: 11/24/11  24-hour weight change: Weight change: 0 lb (0 kg)  Weight trends: Filed Weights   11/25/11 1120 11/25/11 2155 11/26/11 2108  Weight: 113 lb 5.1 oz (51.4 kg) 113 lb 5.1 oz (51.4 kg) 113 lb 5.1 oz (51.4 kg)    Intake/Output:  03/24 0701 - 03/25 0700 In: 730 [P.O.:720; I.V.:10] Out: 300 [Urine:300]  Physical Exam: General: Vital signs reviewed and noted. Well-developed, well-nourished, in no acute distress; alert, appropriate and cooperative throughout examination.  Lungs:  Normal respiratory effort. Decreased air movement at the basis.   Heart: RRR. S1 and S2 normal   Abdomen:  BS normoactive. Soft, Nondistended, non-tender.  Right flank tenderness on palpation. No erythema noted.   Extremities: No pretibial edema.     Labs: Basic Metabolic Panel:  Lab [id number] 0805 11/25/11 0731 11/23/11 0728 11/21/11 0500  NA 139 137 138 137  K 3.7 4.0 3.6 4.3  CL 100 101 99 99  CO2 27 26 28 28   GLUCOSE 151* 133* 170* 172*  BUN 15 27* 27* 22  CREATININE 4.28* 5.54* 5.39* 5.16*  CALCIUM 8.9 8.8 8.7 --  MG -- -- -- --  PHOS -- 3.8 4.2 4.4    Liver Function Tests:  Lab 11/26/11 0805 11/25/11 0731 11/23/11 0728 11/21/11 0500  AST 12 -- -- --  ALT 7 -- -- --  ALKPHOS 64 -- -- --  BILITOT 0.1* -- -- --    PROT 6.1 -- -- --  ALBUMIN 2.4* 2.2* 2.2* 2.1*    Lab 11/26/11 0805  LIPASE 14  AMYLASE --   No results found for this basename: AMMONIA:3 in the last 168 hours  CBC:  Lab 11/26/11 0805 11/25/11 0731 11/23/11 0728 11/21/11 0500  WBC 7.1 8.0 7.9 7.7  NEUTROABS -- -- -- --  HGB 10.6* 9.6* 9.5* 9.9*  HCT 31.8* 28.9* 28.5* 29.8*  MCV 90.3 88.9 87.7 90.0  PLT 183 162 173 178     CBG:  Lab 11/26/11 2038 11/26/11 1709 11/26/11 1159 11/26/11 0743 11/25/11 2059  GLUCAP 121* 124* 100* 146* 83    Microbiology: Results for orders placed during the hospital encounter of 11/15/11  SURGICAL PCR SCREEN     Status: Normal   Collection Time   11/16/11  4:54 AM      Component Value Range Status Comment   MRSA, PCR NEGATIVE  NEGATIVE  Final    Staphylococcus aureus NEGATIVE  NEGATIVE  Final      Imaging: US Abdomen Complete  11/25/2011  *RADIOLOGY REPORT*  Clinical Data:  Right upper quadrant abdominal pain. Right flank pain.  Rule out abscess.  COMPLETE ABDOMINAL ULTRASOUND  Comparison:  11/15/2011 and CT of 04/22/2010.  Findings:  Gallbladder:  Normal, without wall thickening, stone, or pericholecystic fluid.  Sonographic Murphy's sign was not elicited.  Common bile duct: Normal, 4 mm.  Liver: Normal in echogenicity, without focal lesion.  IVC: Negative  Pancreas:  Negative  Spleen:  Splenomegaly.  Splenic size of 13.8 cm and splenic volume of 660 ml.  Right Kidney:  12.2 cm.  Increased echogenicity.  Trace edema or fluid adjacent the right kidney on image 44.  Left Kidney:  10.4 cm.  Mildly increased echogenicity. No hydronephrosis.  Abdominal aorta:  No aneurysm.  Note is made of bilateral pleural effusions.  IMPRESSION:  1.  Normal appearance of the gallbladder and biliary tract. 2.  Splenomegaly. 3.  Bilateral pleural effusions. 4.  Increased renal echogenicity, likely representing medical renal disease. 5.  Suspect trace fluid or edema adjacent the right kidney.  No fluid collection  identified.  No explanation for right flank pain.  Original Report Authenticated By: Areta Haber, M.D.      Medications:    Infusions:    . dextrose 10 mL (11/16/11 1113)    Scheduled Medications:    . calcium carbonate  1 tablet Oral TID WC  . ciprofloxacin  250 mg Oral BID  . darbepoetin (ARANESP) injection - DIALYSIS  100 mcg Intravenous Q Thu-HD  . diltiazem  240 mg Oral Daily  . ferric gluconate (FERRLECIT/NULECIT) IV  125 mg Intravenous Q T,Th,Sa-HD  . hydrALAZINE  50 mg Oral BID  . insulin aspart  0-9 Units Subcutaneous TID WC  . insulin aspart  4 Units Subcutaneous TID WC  . insulin glargine  3 Units Subcutaneous QHS  . levothyroxine  50 mcg Oral Q breakfast  . lisinopril  10 mg Oral QHS  . multivitamin  1 tablet Oral QHS  . pantoprazole  40 mg Oral BID  . sodium chloride  3 mL Intravenous Q12H  . sodium chloride  3 mL Intravenous Q12H  . DISCONTD: ciprofloxacin  500 mg Oral q1800    PRN Medications: sodium chloride, sodium chloride, sodium chloride, acetaminophen, acetaminophen, albuterol, dextrose, feeding supplement (NEPRO CARB STEADY), guaiFENesin-dextromethorphan, heparin, heparin, HYDROcodone-acetaminophen, lidocaine, lidocaine-prilocaine, metoprolol, morphine, ondansetron (ZOFRAN) IV, oxyCODONE, pentafluoroprop-tetrafluoroeth, sodium chloride   Assessment/ Plan:    Pt is a 31 y.o. yo female with a PMHx of DM, Hypertension, CKD who was admitted on 11/15/2011 with Uremia. Renal service is consulted for evaluation and management of ESRD .   1. 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 No indication of uremia at this time. Was accepted at [facility] for TTS schedule.  - No need for emergent HD.  - Plant to have next HD as outpatient on 03/26 - however, if still inpatient at that point, will arrange for inpatient dialysis.   2. Anemia - stable. Currently on Aranesp and ferric  gluconate .   3. Pleural/pericardial effusion likely secondary to Uremia. 2D-Echo showed persistent pericardial effusion despite several session of HD. No Paracardial centesis per Cardiology at this point. CXray showed improving bilateral pleural effusions, left greater than right.   4. Hypertension- Mildly elevated BP. Currently on Diltiazem and Hydralazine as well as Lisinopril which was started 3/21. Adjusted as an outpatient.   5. Right flank pain - unclear etiology. UA negative for fulminant acute infection, however 2/2 recent foley, ESRD, and diabetes is being treated empirically with Cipro x 5 days total. Abd [place] negative for cholelithiasis or nephrolithiasis,does show some trace  fluid or edema surrounding right kidney.  - Symptomatic treatment.  - Continue ciprofloxacin for total 5 days.  - Renal ultrasound pending.   6. Nausea/vomiting - Stable this AM. Patient describes nausea, vomiting ongoing for at least 1-1.5 months. Likely initially secondary to uremia as patient progressed from CKD to ESRD. I wonder if there is a component of gastroparesis in the setting of her diabetes that was previously very poorly controlled. States she has never had a gastric emptying study. Versus possible GERD. Acute etiology also being investigated.  - per primary service.  Length of Stay: 12 days  Patient history and plan of care reviewed with attending, Dr. Dr. Corliss Parish.   Rosalia Hammers, MD PGYII, Internal Medicine Resident 11/27/2011, 7:20 AM  Patient seen and examined, agree with above note with above modifications. Patient being discharged to home today, [facility] regular HD treatments Corliss Parish, MD 11/27/2011

## 2011-11-27 NOTE — Progress Notes (Signed)
  Limited Echocardiogram Limited 2D Echo has been performed.  Alvin Critchley A 11/27/2011, 2:39 PM

## 2011-11-28 NOTE — Progress Notes (Signed)
   CARE MANAGEMENT NOTE 11/28/2011  Patient:  [name] [name],[name] [name]   Account Number:  [id number]  Date Initiated:  11/23/2011  Documentation initiated by:  Tomi Bamberger  Subjective/Objective Assessment:   dx ckd  admit- lives with friend.  pta independent.     Action/Plan:   waiting on clipping for HD,  per Bethena Roys patient will need to go to [facility] close to [employer].  We have mcd pending, but transportation issues need to be worked out,  [place] working on.   Anticipated DC Date:  11/24/2011   Anticipated DC Plan:  [place]  CM consult      Choice offered to / List presented to:             Status of service:  Completed, signed off Medicare Important Message given?   (If response is "NO", the following Medicare IM given date fields will be blank) Date Medicare IM given:   Date Additional Medicare IM given:    Discharge Disposition:  HOME/SELF CARE  Per UR Regulation:    If discussed at Long Length of Stay Meetings, dates discussed:    Comments:  11/27/11- 84- Marvetta Gibbons RN, BSN [phone] Pt for discharge today, pt has been set up with outpt HD for T/th/sat, pt is eligible for indigent fund if needed for any medication needs.  11/23/11 10:21 Tomi Bamberger RN, BSN [phone] Bethena Roys in HD is working on clipping patient,  paper work has been done, just waiting to here if [facility] HD center will accept patient, and CSW is working out transportation issues.  Bethena Roys will let me know when clipping has been completed.

## 2011-12-01 ENCOUNTER — Encounter (HOSPITAL_COMMUNITY): Payer: Medicaid Other

## 2011-12-07 NOTE — ED Provider Notes (Signed)
Medical screening examination/treatment/procedure(s) were conducted as a shared visit with non-physician practitioner(s) and myself.  I personally evaluated the patient during the encounter   Sharyon Cable, MD 12/07/11 1302

## 2011-12-08 ENCOUNTER — Encounter (HOSPITAL_COMMUNITY): Payer: Self-pay

## 2011-12-19 ENCOUNTER — Encounter (HOSPITAL_COMMUNITY): Payer: Self-pay | Admitting: *Deleted

## 2011-12-19 ENCOUNTER — Emergency Department (HOSPITAL_COMMUNITY)
Admission: EM | Admit: 2011-12-19 | Discharge: 2011-12-19 | Disposition: A | Discharge status: Home / Self Care | Payer: Medicaid Other | Attending: Emergency Medicine | Admitting: Emergency Medicine

## 2011-12-19 DIAGNOSIS — G5790 Unspecified mononeuropathy of unspecified lower limb: Secondary | ICD-10-CM | POA: Insufficient documentation

## 2011-12-19 DIAGNOSIS — Z992 Dependence on renal dialysis: Secondary | ICD-10-CM | POA: Insufficient documentation

## 2011-12-19 DIAGNOSIS — Z794 Long term (current) use of insulin: Secondary | ICD-10-CM | POA: Insufficient documentation

## 2011-12-19 DIAGNOSIS — F411 Generalized anxiety disorder: Secondary | ICD-10-CM | POA: Insufficient documentation

## 2011-12-19 DIAGNOSIS — G5793 Unspecified mononeuropathy of bilateral lower limbs: Secondary | ICD-10-CM

## 2011-12-19 DIAGNOSIS — I120 Hypertensive chronic kidney disease with stage 5 chronic kidney disease or end stage renal disease: Secondary | ICD-10-CM | POA: Insufficient documentation

## 2011-12-19 DIAGNOSIS — E1029 Type 1 diabetes mellitus with other diabetic kidney complication: Secondary | ICD-10-CM | POA: Insufficient documentation

## 2011-12-19 DIAGNOSIS — N186 End stage renal disease: Secondary | ICD-10-CM | POA: Insufficient documentation

## 2011-12-19 LAB — CBC
HCT: 36.1 % (ref 36.0–46.0)
Platelets: 149 10*3/uL — ABNORMAL LOW (ref 150–400)
RDW: 13.3 % (ref 11.5–15.5)
WBC: 8.9 10*3/uL (ref 4.0–10.5)

## 2011-12-19 LAB — COMPREHENSIVE METABOLIC PANEL
ALT: 7 U/L (ref 0–35)
Albumin: 3.1 g/dL — ABNORMAL LOW (ref 3.5–5.2)
Alkaline Phosphatase: 84 U/L (ref 39–117)
Potassium: 3.8 mEq/L (ref 3.5–5.1)
Sodium: 141 mEq/L (ref 135–145)
Total Protein: 6.7 g/dL (ref 6.0–8.3)

## 2011-12-19 LAB — DIFFERENTIAL
Basophils Absolute: 0 10*3/uL (ref 0.0–0.1)
LUC, Absolute: 0 10*3/uL (ref 0.0–0.5)
LUCs, %: 0 % (ref 0–4)
Lymphocytes Relative: 20 % (ref 12–46)
Neutro Abs: 6.3 10*3/uL (ref 1.7–7.7)

## 2011-12-19 LAB — POCT I-STAT, CHEM 8
Chloride: 102 mEq/L (ref 96–112)
Creatinine, Ser: 5.7 mg/dL — ABNORMAL HIGH (ref 0.50–1.10)
Glucose, Bld: 266 mg/dL — ABNORMAL HIGH (ref 70–99)
HCT: 36 % (ref 36.0–46.0)
Potassium: 3.5 mEq/L (ref 3.5–5.1)

## 2011-12-19 LAB — LIPASE, BLOOD: Lipase: 63 U/L — ABNORMAL HIGH (ref 11–59)

## 2011-12-19 MED ORDER — HYDROCODONE-ACETAMINOPHEN 7.5-325 MG PO TABS
1.0000 | ORAL_TABLET | Freq: Four times a day (QID) | ORAL | Status: AC | PRN
Start: 2011-12-19 — End: 2011-12-29

## 2011-12-19 MED ORDER — ONDANSETRON HCL 4 MG/2ML IJ SOLN
4.0000 mg | Freq: Once | INTRAMUSCULAR | Status: AC
  Administered 2011-12-19: 4 mg via INTRAVENOUS
  Filled 2011-12-19: qty 2

## 2011-12-19 MED ORDER — FENTANYL CITRATE 0.05 MG/ML IJ SOLN
50.0000 ug | Freq: Once | INTRAMUSCULAR | Status: AC
  Administered 2011-12-19: 50 ug via INTRAVENOUS
  Filled 2011-12-19: qty 2

## 2011-12-19 NOTE — ED Notes (Signed)
MD at bedside. 

## 2011-12-19 NOTE — ED Notes (Signed)
The pt has had body aches for the past 2 hours .  She has more pain in her legs and abd.  Dialyzed sat.  Dialysis cath i her chest.

## 2011-12-19 NOTE — ED Notes (Signed)
Patient c/o of mid abdomen pain,and body aches to the lower extremities, pt states pain level for both abdomen and body aches is 10. She stated" It feels like someone has beat the cramp out of her". Pt has a fistula in her left arm, no s/s of infection on IV site to the left lower arm.

## 2011-12-19 NOTE — Discharge Instructions (Signed)
Neuropathic Pain We often think that pain has a physical cause. If we get rid of the cause, the pain should go away. However, nerves themselves can also cause pain. This pain often does not go away easily. It is called neuropathic pain, which means nerve abnormality. It may be difficult to understand for the patients who have it and for the treating caregivers. Neuropathic pain may be caused by an injury or failure of the nervous system. The pain often results from an injury, but this injury may or may not be the cause of actual damage to the nervous system. Nerves can be penetrated or squashed by tumors, strangled by scar tissue, or become inflamed due to infection. Neuropathic pain is often caused by nerve injury due to diabetes.Alcohol abuse may also lead to neuropathic pain. Neuropathic pain often seems to have no cause. The diagnosis is made when no other cause is found.  Pain may persist for months or years following the healing of damaged tissues. When this happens, pain signals no longer sound an alarm about current injuries or injuries about to happen. Instead, the alarm system itself is not working correctly.  CHARACTERISTICS OF NEUROPATHIC PAIN  Severe, sharp, electric shock-like, shooting, lightening-like, knife-like.   Pins and needles sensation.   Deep burning, deep cold, or deep ache.   Persistent numbness, tingling, or weakness.   Pain resulting from a light touch or other stimulus that would not usually cause pain.   Increased sensitivity to something that would normally cause pain, such as a pinprick.  Neuropathic pain may get worse instead of better over time. For some people, it can lead to serious disability. It is important to be aware that severe injury in a limb can occur without a proper, protective pain response.Burns, cuts, and other injuries may go unnoticed. Without proper treatment, these injuries can become infected or lead to further disability. Take any injury  seriously, and consult your caregiver for treatment. TREATMENT  Neuropathic pain is often long-lasting and tends not to get better when treated with opioids (narcotic types of pain medicine). It may respond well to other drugs such as antiseizure and antidepressant medicines. Usually, neuropathic pain does not completely go away, but partial improvement is often possible with proper treatment. Your caregivers, along with you, will select the medicines which best allow you to live a normal life. Do not be discouraged if you do not get immediate relief. Sometimes different medicines or a combination of medicines will be tried before you receive the benefits you are hoping for. SEEK IMMEDIATE MEDICAL CARE IF:  There is a sudden change in the quality of your pain, especially if the change is on only one side of the body.   You notice changes of the skin, such as redness, black or purple discoloration, swelling, or an ulcer.   You cannot move the affected limbs.  Document Released: 04/13/2004 Document Revised: 08/10/2011 Document Reviewed: 09/29/2010 [facility] Patient Information 2012 [place], [place].

## 2011-12-19 NOTE — ED Provider Notes (Signed)
History     CSN: [id number]  Arrival date & time 12/19/11  [id number]   First MD Initiated Contact with Patient 12/19/11 [phone]      Chief Complaint  Patient presents with  . Body aches     (Consider location/radiation/quality/duration/timing/severity/associated sxs/prior treatment) HPI This is a 31 year old female with type 1 diabetes and end-stage renal disease on dialysis. She developed the sudden onset of severe pain in her calves this morning about 2 hours prior to arrival. She is also having less her pain in the other muscles of her body as well as epigastric pain. The pain in her legs is worse with movement or palpation. She's having chills but is not aware of having a fever. She is nauseated but not having vomiting or diarrhea. She is due for dialysis today.  Past Medical History  Diagnosis Date  . DIABETES MELLITUS, TYPE I, UNCONTROLLED 11/05/2007  . MEDIAL EPICONDYLITIS, LEFT 12/17/2007  . Vision loss   . Renal disorder   . Diabetes mellitus   . Hypertension     Past Surgical History  Procedure Date  . Refractive surgery   . Eye surgery   . Av fistula placement 11/17/2011    Procedure: ARTERIOVENOUS (AV) FISTULA CREATION;  Surgeon: Rosetta Posner, MD;  Location: [facility] OR;  Service: Vascular;  Laterality: Right;    Family History  Problem Relation Age of Onset  . Hypertension Father     History  Substance Use Topics  . Smoking status: Former Smoker -- 1 years    Types: Pipe  . Smokeless tobacco: Never Used  . Alcohol Use: No    OB History    Grav Para Term Preterm Abortions TAB SAB Ect Mult Living                  Review of Systems  All other systems reviewed and are negative.    Allergies  Shrimp  Home Medications   Current Outpatient Rx  Name Route Sig Dispense Refill  . CALCIUM CARBONATE ANTACID 500 MG PO CHEW Oral Chew 1 tablet (200 mg of elemental calcium total) by mouth 3 (three) times daily with meals. 90 tablet 0  . DILTIAZEM HCL ER COATED BEADS 240  MG PO CP24 Oral Take 240 mg by mouth daily.    Marland Kitchen HYDRALAZINE HCL 50 MG PO TABS Oral Take 50 mg by mouth 2 (two) times daily.    . INSULIN ASPART 100 UNIT/ML [place] SOLN Subcutaneous Inject 4 Units into the skin 3 (three) times daily with meals. 1 vial 0  . INSULIN GLARGINE 100 UNIT/ML [place] SOLN Subcutaneous Inject 3 Units into the skin at bedtime. 10 mL 0  . LEVOTHYROXINE SODIUM 50 MCG PO TABS Oral Take 50 mcg by mouth daily.    Marland Kitchen LISINOPRIL 10 MG PO TABS Oral Take 1 tablet (10 mg total) by mouth at bedtime. 30 tablet 0  . PANTOPRAZOLE SODIUM 40 MG PO TBEC Oral Take 1 tablet (40 mg total) by mouth 2 (two) times daily. 30 tablet 0  . INSULIN PEN NEEDLE 31G X 8 MM MISC Does not apply 1 each by Does not apply route 2 (two) times daily. 60 each 11  . UNABLE TO FIND  Glucometer #1 for diabetis Test strips and lancets for QID BS monitoring- 1 month supply 1 each 0    BP 177/85  Pulse 108  Temp(Src) 98.4 F (36.9 C) (Oral)  Resp 16  SpO2 94%  Physical Exam General: Well-developed, well-nourished female in  no acute distress; appearance consistent with age of record HENT: normocephalic, atraumatic Eyes: pupils equal round and reactive to light; extraocular muscles intact Neck: supple Heart: regular rate and rhythm Lungs: clear to auscultation bilaterally Chest: Vas-Cath right upper chest Abdomen: soft; nondistended; mild epigastric tenderness; bowel sounds present Extremities: No deformity; full range of motion; pulses normal; tenderness and hyperesthesia of the lower legs and feet, dialysis fistula right antecubital fossa with pulse and; trace edema of lower leg Neurologic: Awake, alert; motor function intact in all extremities and symmetric; no facial droop Skin: Warm and dry Psychiatric: Anxious    ED Course  Procedures (including critical care time)     MDM   Nursing notes and vitals signs, including pulse oximetry, reviewed.  Summary of this visit's results, reviewed by  myself:  Labs:  Results for orders placed during the hospital encounter of 12/19/11  CBC      Component Value Range   WBC 8.9  4.0 - 10.5 (K/uL)   RBC 4.21  3.87 - 5.11 (MIL/uL)   Hemoglobin 12.3  12.0 - 15.0 (g/dL)   HCT 36.1  36.0 - 46.0 (%)   MCV 85.7  78.0 - 100.0 (fL)   MCH 29.2  26.0 - 34.0 (pg)   MCHC 34.1  30.0 - 36.0 (g/dL)   RDW 13.3  11.5 - 15.5 (%)   Platelets 149 (*) 150 - 400 (K/uL)  DIFFERENTIAL      Component Value Range   Neutrophils Relative 71  43 - 77 (%)   Neutro Abs 6.3  1.7 - 7.7 (K/uL)   Lymphocytes Relative 20  12 - 46 (%)   Lymphs Abs 1.8  0.7 - 4.0 (K/uL)   Monocytes Relative 6  3 - 12 (%)   Monocytes Absolute 0.5  0.1 - 1.0 (K/uL)   Eosinophils Relative 3  0 - 5 (%)   Eosinophils Absolute 0.2  0.0 - 0.7 (K/uL)   Basophils Relative 0  0 - 1 (%)   Basophils Absolute 0.0  0.0 - 0.1 (K/uL)   LUCs, % 0  0 - 4 (%)   LUC, Absolute 0.0  0.0 - 0.5 (K/uL)  COMPREHENSIVE METABOLIC PANEL      Component Value Range   Sodium 141  135 - 145 (mEq/L)   Potassium 3.8  3.5 - 5.1 (mEq/L)   Chloride 99  96 - 112 (mEq/L)   CO2 28  19 - 32 (mEq/L)   Glucose, Bld 261 (*) 70 - 99 (mg/dL)   BUN 29 (*) 6 - 23 (mg/dL)   Creatinine, Ser 5.74 (*) 0.50 - 1.10 (mg/dL)   Calcium 9.5  8.4 - 10.5 (mg/dL)   Total Protein 6.7  6.0 - 8.3 (g/dL)   Albumin 3.1 (*) 3.5 - 5.2 (g/dL)   AST 13  0 - 37 (U/L)   ALT 7  0 - 35 (U/L)   Alkaline Phosphatase 84  39 - 117 (U/L)   Total Bilirubin 0.2 (*) 0.3 - 1.2 (mg/dL)   GFR calc non Af Amer 9 (*) >90 (mL/min)   GFR calc Af Amer 11 (*) >90 (mL/min)  LIPASE, BLOOD      Component Value Range   Lipase 63 (*) 11 - 59 (U/L)  CK      Component Value Range   Total CK 68  7 - 177 (U/L)  POCT I-STAT, CHEM 8      Component Value Range   Sodium 139  135 - 145 (mEq/L)   Potassium  3.5  3.5 - 5.1 (mEq/L)   Chloride 102  96 - 112 (mEq/L)   BUN 29 (*) 6 - 23 (mg/dL)   Creatinine, Ser 5.70 (*) 0.50 - 1.10 (mg/dL)   Glucose, Bld 266 (*) 70 - 99  (mg/dL)   Calcium, Ion 1.17  1.12 - 1.32 (mmol/L)   TCO2 29  0 - 100 (mmol/L)   Hemoglobin 12.2  12.0 - 15.0 (g/dL)   HCT 36.0  36.0 - 46.0 (%)   8:36 AM Patient's pain improved with IV fentanyl. Lower legs and notably feet are hyperesthetic and tender. This pattern is consistent with diabetic neuropathy. Patient has no history of diabetic neuropathy. We will treat her pain and she will followup with her nephrologist this morning at dialysis.         Wynetta Fines, MD 12/19/11 [phone]

## 2011-12-25 ENCOUNTER — Encounter: Payer: Self-pay | Admitting: Vascular Surgery

## 2011-12-26 ENCOUNTER — Ambulatory Visit: Payer: Self-pay | Admitting: Vascular Surgery

## 2012-01-26 ENCOUNTER — Encounter: Payer: Self-pay | Admitting: Vascular Surgery

## 2012-01-30 ENCOUNTER — Ambulatory Visit: Payer: Self-pay | Admitting: Vascular Surgery

## 2012-02-05 ENCOUNTER — Encounter: Payer: Self-pay | Admitting: Vascular Surgery

## 2012-02-06 ENCOUNTER — Encounter: Payer: Self-pay | Admitting: Vascular Surgery

## 2012-02-06 ENCOUNTER — Ambulatory Visit (INDEPENDENT_AMBULATORY_CARE_PROVIDER_SITE_OTHER): Payer: Medicaid Other | Admitting: Vascular Surgery

## 2012-02-06 VITALS — BP 118/66 | HR 92 | Temp 98.7°F | Ht 63.0 in | Wt 111.0 lb

## 2012-02-06 DIAGNOSIS — N186 End stage renal disease: Secondary | ICD-10-CM | POA: Insufficient documentation

## 2012-02-06 NOTE — Progress Notes (Signed)
The patient presents today for followup of right upper arm AV fistula creation on 3:15. She had a hemodialysis via her right IJ dietetic catheter reports no problem with this.  Physical exam reveals well-healed incision in her right antecubital space. She does have an excellent thrill in her upper arm AV fistula. There is good flow through this and she does have a good size maturation. She is quite small in stature. I feel that she is able to begin the use of this fistula with hemodialysis. I explained the critical importance of having only the most experience access staff user fistula due to the size. We will remove her hemodialysis catheter when she's had several successful uses of her fistula. I have sent a handwritten note with the patient to give to the dialysis access staff stating it is appropriate to begin using her fistula

## 2012-02-17 ENCOUNTER — Emergency Department (HOSPITAL_COMMUNITY)
Admission: EM | Admit: 2012-02-17 | Discharge: 2012-02-17 | Disposition: A | Discharge status: Home / Self Care | Payer: Medicaid Other | Attending: Emergency Medicine | Admitting: Emergency Medicine

## 2012-02-17 ENCOUNTER — Encounter (HOSPITAL_COMMUNITY): Payer: Self-pay | Admitting: Emergency Medicine

## 2012-02-17 DIAGNOSIS — R1032 Left lower quadrant pain: Secondary | ICD-10-CM | POA: Insufficient documentation

## 2012-02-17 DIAGNOSIS — R109 Unspecified abdominal pain: Secondary | ICD-10-CM

## 2012-02-17 DIAGNOSIS — Z87891 Personal history of nicotine dependence: Secondary | ICD-10-CM | POA: Insufficient documentation

## 2012-02-17 DIAGNOSIS — B9689 Other specified bacterial agents as the cause of diseases classified elsewhere: Secondary | ICD-10-CM

## 2012-02-17 DIAGNOSIS — I120 Hypertensive chronic kidney disease with stage 5 chronic kidney disease or end stage renal disease: Secondary | ICD-10-CM | POA: Insufficient documentation

## 2012-02-17 DIAGNOSIS — R112 Nausea with vomiting, unspecified: Secondary | ICD-10-CM | POA: Insufficient documentation

## 2012-02-17 DIAGNOSIS — E109 Type 1 diabetes mellitus without complications: Secondary | ICD-10-CM | POA: Insufficient documentation

## 2012-02-17 DIAGNOSIS — N186 End stage renal disease: Secondary | ICD-10-CM | POA: Insufficient documentation

## 2012-02-17 DIAGNOSIS — R197 Diarrhea, unspecified: Secondary | ICD-10-CM

## 2012-02-17 DIAGNOSIS — Z794 Long term (current) use of insulin: Secondary | ICD-10-CM | POA: Insufficient documentation

## 2012-02-17 LAB — BASIC METABOLIC PANEL
BUN: 26 mg/dL — ABNORMAL HIGH (ref 6–23)
CO2: 31 mEq/L (ref 19–32)
Chloride: 99 mEq/L (ref 96–112)
Creatinine, Ser: 4.04 mg/dL — ABNORMAL HIGH (ref 0.50–1.10)
GFR calc Af Amer: 16 mL/min — ABNORMAL LOW (ref >90)
Glucose, Bld: 190 mg/dL — ABNORMAL HIGH (ref 70–99)

## 2012-02-17 LAB — URINALYSIS, ROUTINE W REFLEX MICROSCOPIC
Bilirubin Urine: NEGATIVE
Glucose, UA: 250 mg/dL — AB
Specific Gravity, Urine: 1.015 (ref 1.005–1.030)
pH: 8 (ref 5.0–8.0)

## 2012-02-17 LAB — WET PREP, GENITAL

## 2012-02-17 LAB — CBC
HCT: 31.9 % — ABNORMAL LOW (ref 36.0–46.0)
Hemoglobin: 11.1 g/dL — ABNORMAL LOW (ref 12.0–15.0)
MCHC: 34.8 g/dL (ref 30.0–36.0)
RBC: 3.52 MIL/uL — ABNORMAL LOW (ref 3.87–5.11)

## 2012-02-17 LAB — URINE MICROSCOPIC-ADD ON

## 2012-02-17 LAB — PREGNANCY, URINE: Preg Test, Ur: NEGATIVE

## 2012-02-17 LAB — DIFFERENTIAL
Basophils Relative: 0 % (ref 0–1)
Lymphs Abs: 1 10*3/uL (ref 0.7–4.0)
Monocytes Absolute: 0.5 10*3/uL (ref 0.1–1.0)
Monocytes Relative: 6 % (ref 3–12)
Neutro Abs: 7.7 10*3/uL (ref 1.7–7.7)

## 2012-02-17 LAB — GLUCOSE, CAPILLARY: Glucose-Capillary: 185 mg/dL — ABNORMAL HIGH (ref 70–99)

## 2012-02-17 MED ORDER — ONDANSETRON HCL 4 MG/2ML IJ SOLN
4.0000 mg | Freq: Once | INTRAMUSCULAR | Status: AC
  Administered 2012-02-17: 4 mg via INTRAVENOUS
  Filled 2012-02-17: qty 2

## 2012-02-17 MED ORDER — ONDANSETRON 4 MG PO TBDP: 4.0000 mg | ORAL_TABLET | Freq: Three times a day (TID) | ORAL | Status: DC | PRN

## 2012-02-17 MED ORDER — HYDROCODONE-ACETAMINOPHEN 5-325 MG PO TABS: 2.0000 | ORAL_TABLET | ORAL | Status: AC | PRN

## 2012-02-17 MED ORDER — MORPHINE SULFATE 4 MG/ML IJ SOLN
4.0000 mg | Freq: Once | INTRAMUSCULAR | Status: AC
  Administered 2012-02-17: 4 mg via INTRAVENOUS
  Filled 2012-02-17: qty 1

## 2012-02-17 MED ORDER — METRONIDAZOLE 500 MG PO TABS: 500.0000 mg | ORAL_TABLET | Freq: Two times a day (BID) | ORAL | Status: DC

## 2012-02-17 MED ORDER — METRONIDAZOLE 500 MG PO TABS: 500.0000 mg | ORAL_TABLET | Freq: Two times a day (BID) | ORAL | Status: AC

## 2012-02-17 MED ORDER — ONDANSETRON 4 MG PO TBDP: 4.0000 mg | ORAL_TABLET | Freq: Three times a day (TID) | ORAL | Status: AC | PRN

## 2012-02-17 NOTE — ED Notes (Addendum)
Pt brought in via EMS from dialysis for LLQ pain that radiates into rectum. Pt had reports n/v/d at HD. Pt states it feels as if she has to have a BM from pressure in her rectum.

## 2012-02-17 NOTE — ED Notes (Signed)
PA-C in room at this time. Pt reports vomiting x4 at HD with diarrhea. Pt ate breakfast this AM prior to HD, and had hamburger last night for dinner. Pt hx diabetes.

## 2012-02-17 NOTE — ED Notes (Signed)
Unable to get IV access. IV team paged.

## 2012-02-17 NOTE — ED Provider Notes (Signed)
History     CSN: [id number]  Arrival date & time 02/17/12  1015   First MD Initiated Contact with Patient 02/17/12 1022      Chief Complaint  Patient presents with  . Abdominal Pain    (Consider location/radiation/quality/duration/timing/severity/associated sxs/prior treatment) HPI Comments: Patient with a history of DM type I and ESRD currently on dialysis comes in today with a chief complaint of abdominal pain, nausea, vomiting, and diarrhea.  Symptoms came on while she was at dialysis this morning.  She reports that the abdominal pain is mostly located in the LLQ.  Pain does not radiate.  She denies any vaginal discharge or vaginal bleeding.  LMP 02/07/12 and was normal.  Patient is sexually active.  Patient denies any fever or chills.  Patient is a 31 y.o. female presenting with abdominal pain. The history is provided by the patient.  Abdominal Pain The primary symptoms of the illness include abdominal pain, nausea and vomiting. The primary symptoms of the illness do not include fever, shortness of breath, diarrhea, hematemesis, hematochezia, dysuria, vaginal discharge or vaginal bleeding. The current episode started 1 to 2 hours ago. The onset of the illness was sudden.  The patient states that she believes she is currently not pregnant. The patient has had a change in bowel habit. Symptoms associated with the illness do not include chills, diaphoresis, constipation, urgency, hematuria, frequency or back pain.    Past Medical History  Diagnosis Date  . DIABETES MELLITUS, TYPE I, UNCONTROLLED 11/05/2007  . MEDIAL EPICONDYLITIS, LEFT 12/17/2007  . Vision loss   . Renal disorder   . Diabetes mellitus   . Hypertension     Past Surgical History  Procedure Date  . Refractive surgery   . Eye surgery   . Av fistula placement 11/17/2011    Procedure: ARTERIOVENOUS (AV) FISTULA CREATION;  Surgeon: Rosetta Posner, MD;  Location: [facility] OR;  Service: Vascular;  Laterality: Right;    Family  History  Problem Relation Age of Onset  . Hypertension Father     History  Substance Use Topics  . Smoking status: Former Smoker -- 1 years    Types: Pipe  . Smokeless tobacco: Never Used   Comment: Hooka  . Alcohol Use: No    OB History    Grav Para Term Preterm Abortions TAB SAB Ect Mult Living                  Review of Systems  Constitutional: Negative for fever, chills and diaphoresis.  Respiratory: Negative for shortness of breath.   Cardiovascular: Negative for chest pain.  Gastrointestinal: Positive for nausea, vomiting and abdominal pain. Negative for diarrhea, constipation, blood in stool, hematochezia, abdominal distention, anal bleeding and hematemesis.  Genitourinary: Negative for dysuria, urgency, frequency, hematuria, flank pain, vaginal bleeding and vaginal discharge.  Musculoskeletal: Negative for back pain.  Neurological: Negative for dizziness, syncope and light-headedness.    Allergies  Shrimp  Home Medications   Current Outpatient Rx  Name Route Sig Dispense Refill  . CALCIUM CARBONATE ANTACID 500 MG PO CHEW Oral Chew 1 tablet by mouth 3 (three) times daily.    Marland Kitchen DILTIAZEM HCL ER COATED BEADS 240 MG PO CP24 Oral Take 240 mg by mouth daily.    Marland Kitchen DIALYVITE 3000 3 MG PO TABS Oral Take 1 tablet by mouth daily.    Marland Kitchen HYDRALAZINE HCL 50 MG PO TABS Oral Take 50 mg by mouth 2 (two) times daily.    . INSULIN LISPRO  PROT & LISPRO (75-25) 100 UNIT/ML [place] SUSP Subcutaneous Inject 10-18 Units into the skin 2 (two) times daily with a meal. Inject 10 units in the morning and 18 units at night.    Marland Kitchen LEVOTHYROXINE SODIUM 50 MCG PO TABS Oral Take 50 mcg by mouth daily.    Marland Kitchen LISINOPRIL 10 MG PO TABS Oral Take 10 mg by mouth daily.    Marland Kitchen LOSARTAN POTASSIUM 100 MG PO TABS Oral Take 100 mg by mouth daily.    Marland Kitchen OMEPRAZOLE 20 MG PO CPDR Oral Take 20 mg by mouth daily.    Marland Kitchen ONDANSETRON HCL 4 MG PO TABS Oral Take 4 mg by mouth every 8 (eight) hours as needed. For nausea.       BP 168/86  Pulse 85  Temp 97.9 F (36.6 C) (Oral)  Resp 22  SpO2 99%  Physical Exam  Nursing note and vitals reviewed. Constitutional: She appears well-developed and well-nourished.       tearful  HENT:  Head: Normocephalic and atraumatic.  Mouth/Throat: Oropharynx is clear and moist.  Neck: Normal range of motion. Neck supple.  Cardiovascular: Normal rate, regular rhythm and normal heart sounds.   Pulmonary/Chest: Effort normal and breath sounds normal.  Abdominal: Soft. Bowel sounds are normal. She exhibits no mass. There is tenderness in the left lower quadrant. There is no rigidity, no rebound and no guarding.  Genitourinary: Uterus normal. There is no rash, tenderness or lesion on the right labia. There is no rash, tenderness or lesion on the left labia. Cervix exhibits discharge. Cervix exhibits no motion tenderness. Right adnexum displays no mass, no tenderness and no fullness. Left adnexum displays no mass, no tenderness and no fullness.  Neurological: She is alert.  Skin: Skin is warm and dry. She is not diaphoretic.  Psychiatric: She has a normal mood and affect.    ED Course  Procedures (including critical care time)   Labs Reviewed  CBC  DIFFERENTIAL  BASIC METABOLIC PANEL  LIPASE, BLOOD  URINALYSIS, ROUTINE W REFLEX MICROSCOPIC   No results found.   No diagnosis found.  12:38 PM Reassessed patient.  She reports that her symptoms have improved at this time. 3:30 PM Reassessed patient.  She reports that her symptoms have improved at this time.  No vomiting or diarrhea since arrival in the ED.  Patient able to tolerate po liquids.    MDM  Patient with ESRD and type I DM presents today with nausea, vomiting, diarrhea, and abdominal pain that began earlier today.  Patient afebrile.  Labs unremarkable.  No vomiting or diarrhea while in the ED.  Pain improved while in ED.  No rebound or guarding on abdominal exam.  Therefore, feel that patient can be  discharged home.  Return precautions discussed with patient.        Sherlyn Lees Jemison, PA-C 02/18/12 1949

## 2012-02-17 NOTE — ED Notes (Signed)
CBG 185 Rn notified [place]

## 2012-02-17 NOTE — Discharge Instructions (Signed)
Follow up with your Primary Care Physician listed above for further evaluation of your abdominal pain. Only use your pain medication for severe pain. Do not operate heavy machinery while on pain medication or muscle relaxer. Note that your pain medication contains acetaminophen (Tylenol) & its is not recommended that you use additional acetaminophen (Tylenol) while taking this medication.   Abdominal Pain  Your exam might not show the exact reason you have abdominal pain. Since there are many different causes of abdominal pain, another checkup and more tests may be needed. It is very important to follow up for lasting (persistent) or worsening symptoms. A possible cause of abdominal pain in any person who still has his or her appendix is acute appendicitis. Appendicitis is often hard to diagnose. Normal blood tests, urine tests, ultrasound, and CT scans do not completely rule out early appendicitis or other causes of abdominal pain. Sometimes, only the changes that happen over time will allow appendicitis and other causes of abdominal pain to be determined. Other potential problems that may require surgery may also take time to become more apparent. Because of this, it is important that you follow all of the instructions below.   HOME CARE INSTRUCTIONS  Do not take laxatives unless directed by your caregiver. Rest as much as possible.  Do not eat solid food until your pain is gone: A diet of water, weak decaffeinated tea, broth or bouillon, gelatin, oral rehydration solutions (ORS), frozen ice pops, or ice chips may be helpful.  When pain is gone: Start a light diet (dry toast, crackers, applesauce, or white rice). Increase the diet slowly as long as it does not bother you. Eat no dairy products (including cheese and eggs) and no spicy, fatty, fried, or high-fiber foods.  Use no alcohol, caffeine, or cigarettes.  Take your regular medicines unless your caregiver told you not to.  Take any prescribed  medicine as directed.   SEEK IMMEDIATE MEDICAL CARE IF:  The pain does not go away.  You have a fever >101 that persists You keep throwing up (vomiting) or cannot drink liquids.  The pain becomes localized (Pain in the right side could possibly be appendicitis. In an adult, pain in the left lower portion of the abdomen could be colitis or diverticulitis). You pass bloody or black tarry stools.  You have shaking chills.  There is blood in your vomit or you see blood in your bowel movements.  Your bowel movements stop (become blocked) or you cannot pass gas.  You have bloody, frequent, or painful urination.  You have yellow discoloration in the skin or whites of the eyes.  Your stomach becomes bloated or bigger.  You have dizziness or fainting.  You have chest or back pain.

## 2012-02-17 NOTE — ED Notes (Signed)
NAD noted at time of d/c home. Pt verbalized understanding of d/c inst

## 2012-02-19 LAB — GC/CHLAMYDIA PROBE AMP, GENITAL: GC Probe Amp, Genital: NEGATIVE

## 2012-02-19 NOTE — ED Provider Notes (Signed)
Medical screening examination/treatment/procedure(s) were performed by non-physician practitioner and as supervising physician I was immediately available for consultation/collaboration.   Saddie Benders. Kirah Stice, MD 02/19/12 1537

## 2012-03-22 ENCOUNTER — Other Ambulatory Visit (HOSPITAL_COMMUNITY): Payer: Self-pay | Admitting: Nephrology

## 2012-03-22 DIAGNOSIS — N186 End stage renal disease: Secondary | ICD-10-CM

## 2012-03-27 ENCOUNTER — Ambulatory Visit (HOSPITAL_COMMUNITY)
Admission: RE | Admit: 2012-03-27 | Discharge: 2012-03-27 | Disposition: A | Discharge status: Home / Self Care | Payer: Medicaid Other | Source: Ambulatory Visit | Attending: Nephrology | Admitting: Nephrology

## 2012-03-27 DIAGNOSIS — Z452 Encounter for adjustment and management of vascular access device: Secondary | ICD-10-CM | POA: Insufficient documentation

## 2012-03-27 DIAGNOSIS — N186 End stage renal disease: Secondary | ICD-10-CM | POA: Insufficient documentation

## 2012-03-27 IMAGING — XA IR REMOVAL TUNNELED CV CATH
1 series · 1 of 1 positions shown · non-contrast
Comparison: None

CLINICAL DATA: Mature fistula for end-stage renal disease, request
made for tunneled access removal

REMOVAL TUNNELED CENTRAL VENOUS CATHETER

[Series 300: sp removal tun cv cath w/o fl · non-contrast · 1 of 1 slices shown]
[im 1/1]
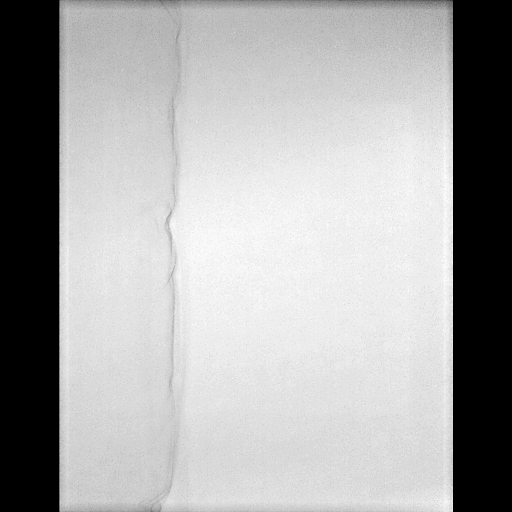

[1 of 1 positions shown; findings below may reference images not displayed]

FINDINGS: The patient's right chest and catheter were prepped and
draped in the normal sterile fashion.  Heparin was removed from
both ports of the catheter.  1% lidocaine was used for local
anesthesia.  Using gentle blunt dissection, the cuff was exposed
and the catheter was removed in its entirety.  Pressure was held
until adequate hemostasis was obtained.  Clean sterile occlusive
dressing was applied.  The patient tolerated the procedure well
with no immediate complications.  Tip of catheter was not sent for
cultures.
IMPRESSION: Successful right IJ tunneled catheter removal as
described above.

Read by UKKIS

## 2012-03-27 MED ORDER — CHLORHEXIDINE GLUCONATE 4 % EX LIQD
CUTANEOUS | Status: AC
  Filled 2012-03-27: qty 45

## 2012-03-27 NOTE — Procedures (Signed)
Successful removal of tunneled right IJ HD catheter. Sterile technique and protocol. Sterile occlusive dressing applied. No complications.  Ascencion Dike PA-C 03/27/2012 1:07 PM

## 2012-04-10 ENCOUNTER — Encounter (HOSPITAL_COMMUNITY): Payer: Self-pay

## 2012-04-10 ENCOUNTER — Emergency Department (HOSPITAL_COMMUNITY)
Admission: EM | Admit: 2012-04-10 | Discharge: 2012-04-11 | Disposition: A | Discharge status: Home / Self Care | Payer: Medicaid Other | Attending: Emergency Medicine | Admitting: Emergency Medicine

## 2012-04-10 DIAGNOSIS — Z87891 Personal history of nicotine dependence: Secondary | ICD-10-CM | POA: Insufficient documentation

## 2012-04-10 DIAGNOSIS — I10 Essential (primary) hypertension: Secondary | ICD-10-CM | POA: Insufficient documentation

## 2012-04-10 DIAGNOSIS — Z79899 Other long term (current) drug therapy: Secondary | ICD-10-CM | POA: Insufficient documentation

## 2012-04-10 DIAGNOSIS — R739 Hyperglycemia, unspecified: Secondary | ICD-10-CM

## 2012-04-10 DIAGNOSIS — N390 Urinary tract infection, site not specified: Secondary | ICD-10-CM | POA: Insufficient documentation

## 2012-04-10 DIAGNOSIS — E109 Type 1 diabetes mellitus without complications: Secondary | ICD-10-CM | POA: Insufficient documentation

## 2012-04-10 NOTE — ED Notes (Signed)
Cataract surgery 2 days ago with surgical dressing in place. C/O fevers, chills, nausea, vomiting and anxiety.

## 2012-04-11 ENCOUNTER — Emergency Department (HOSPITAL_COMMUNITY): Payer: Medicaid Other

## 2012-04-11 LAB — URINALYSIS, ROUTINE W REFLEX MICROSCOPIC
Glucose, UA: >1000 mg/dL — AB
Ketones, ur: NEGATIVE mg/dL
Leukocytes, UA: NEGATIVE
Nitrite: NEGATIVE
Protein, ur: >300 mg/dL — AB
Urobilinogen, UA: 0.2 mg/dL (ref 0.0–1.0)

## 2012-04-11 LAB — CBC WITH DIFFERENTIAL/PLATELET
Basophils Absolute: 0 10*3/uL (ref 0.0–0.1)
Basophils Relative: 0 % (ref 0–1)
Eosinophils Absolute: 0.1 10*3/uL (ref 0.0–0.7)
Eosinophils Relative: 2 % (ref 0–5)
Lymphs Abs: 0.5 10*3/uL — ABNORMAL LOW (ref 0.7–4.0)
MCH: 31.7 pg (ref 26.0–34.0)
MCV: 86.5 fL (ref 78.0–100.0)
Neutrophils Relative %: 82 % — ABNORMAL HIGH (ref 43–77)
Platelets: 115 10*3/uL — ABNORMAL LOW (ref 150–400)
RBC: 3.12 MIL/uL — ABNORMAL LOW (ref 3.87–5.11)
RDW: 11.2 % — ABNORMAL LOW (ref 11.5–15.5)

## 2012-04-11 LAB — URINE MICROSCOPIC-ADD ON

## 2012-04-11 LAB — GLUCOSE, CAPILLARY: Glucose-Capillary: 255 mg/dL — ABNORMAL HIGH (ref 70–99)

## 2012-04-11 LAB — POCT I-STAT, CHEM 8
Creatinine, Ser: 8.3 mg/dL — ABNORMAL HIGH (ref 0.50–1.10)
Glucose, Bld: 391 mg/dL — ABNORMAL HIGH (ref 70–99)
Hemoglobin: 9.5 g/dL — ABNORMAL LOW (ref 12.0–15.0)
Potassium: 4.7 mEq/L (ref 3.5–5.1)

## 2012-04-11 LAB — LACTIC ACID, PLASMA: Lactic Acid, Venous: 1.2 mmol/L (ref 0.5–2.2)

## 2012-04-11 IMAGING — CR DG CHEST 2V
2 series · 2 of 2 positions shown · non-contrast
Comparison: [DATE]

CLINICAL DATA: Nausea, emesis

CHEST - 2 VIEW

[w chest pa]
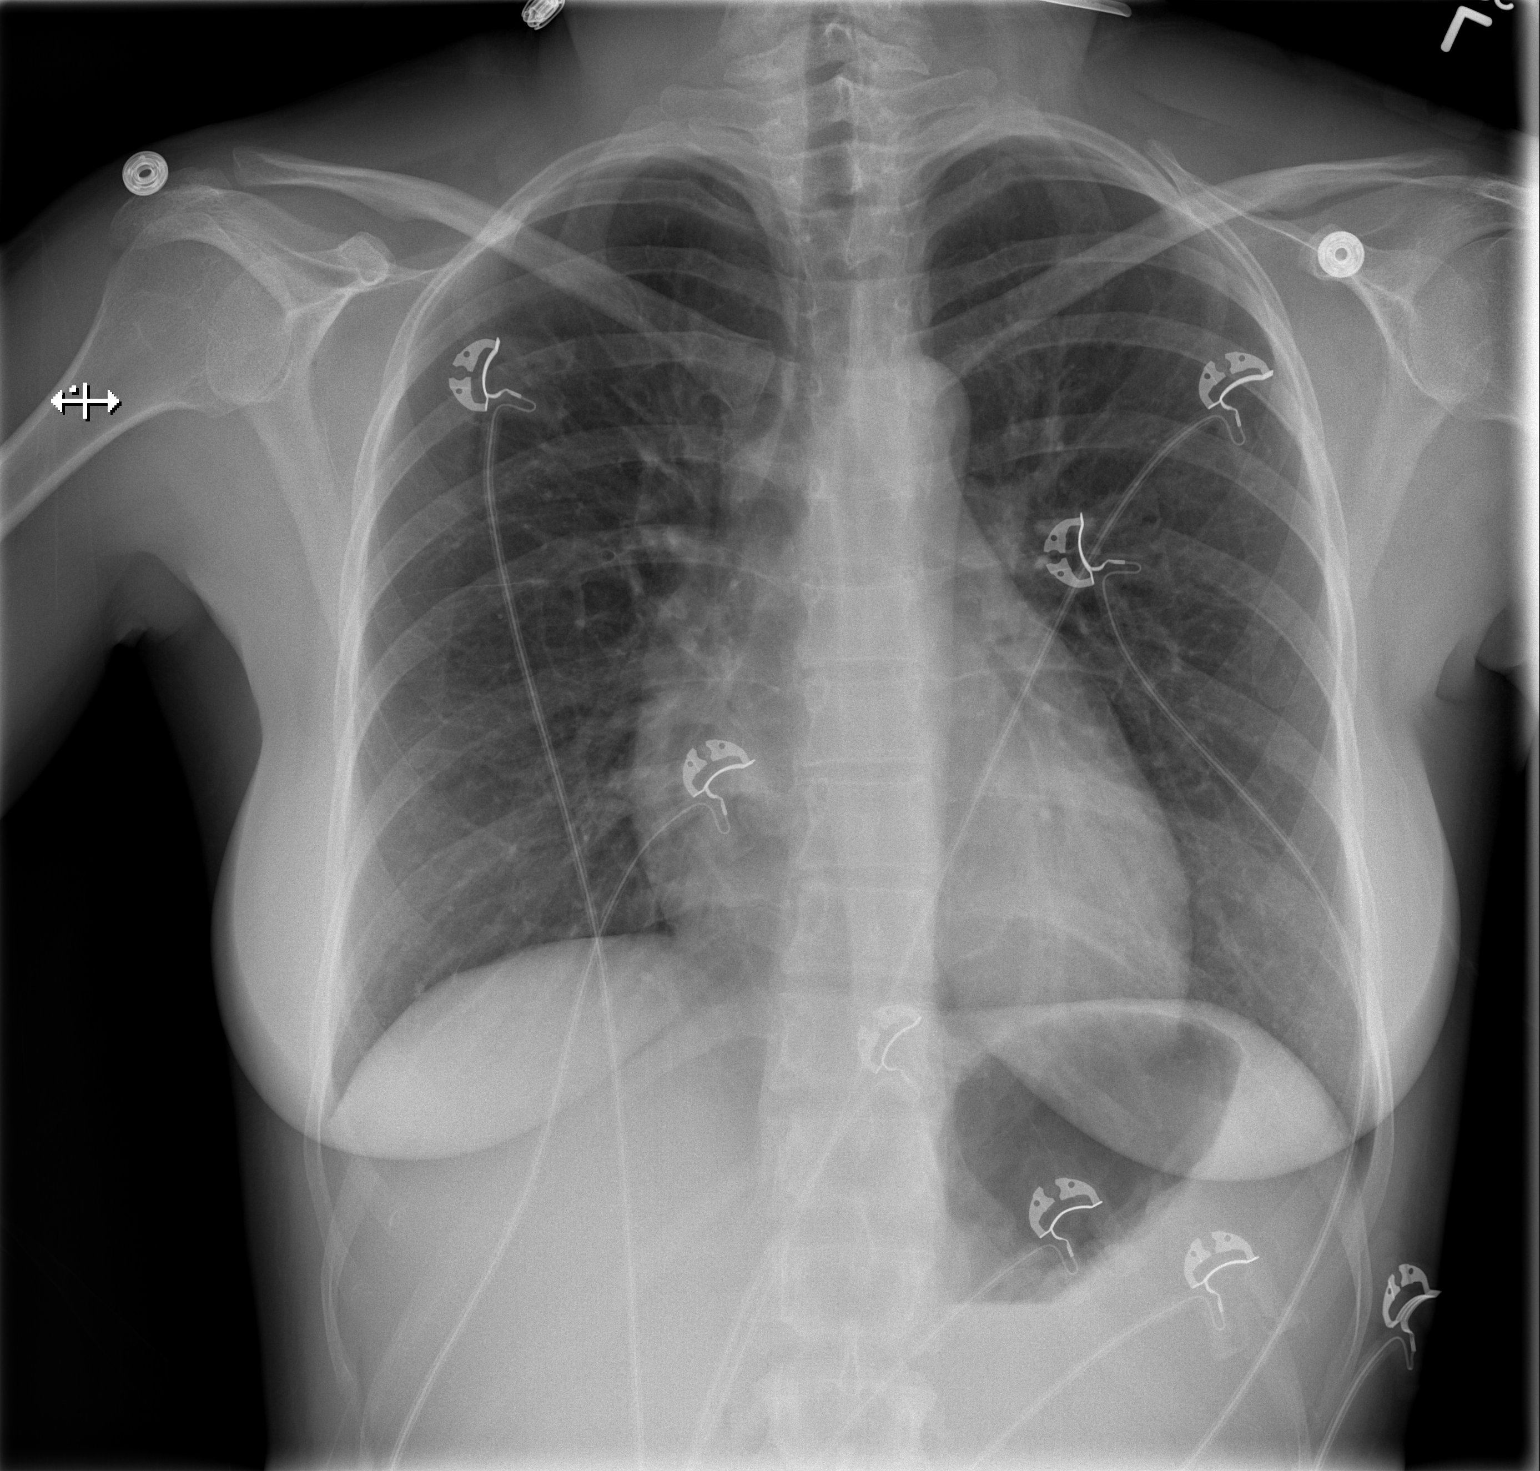

[w chest lat]
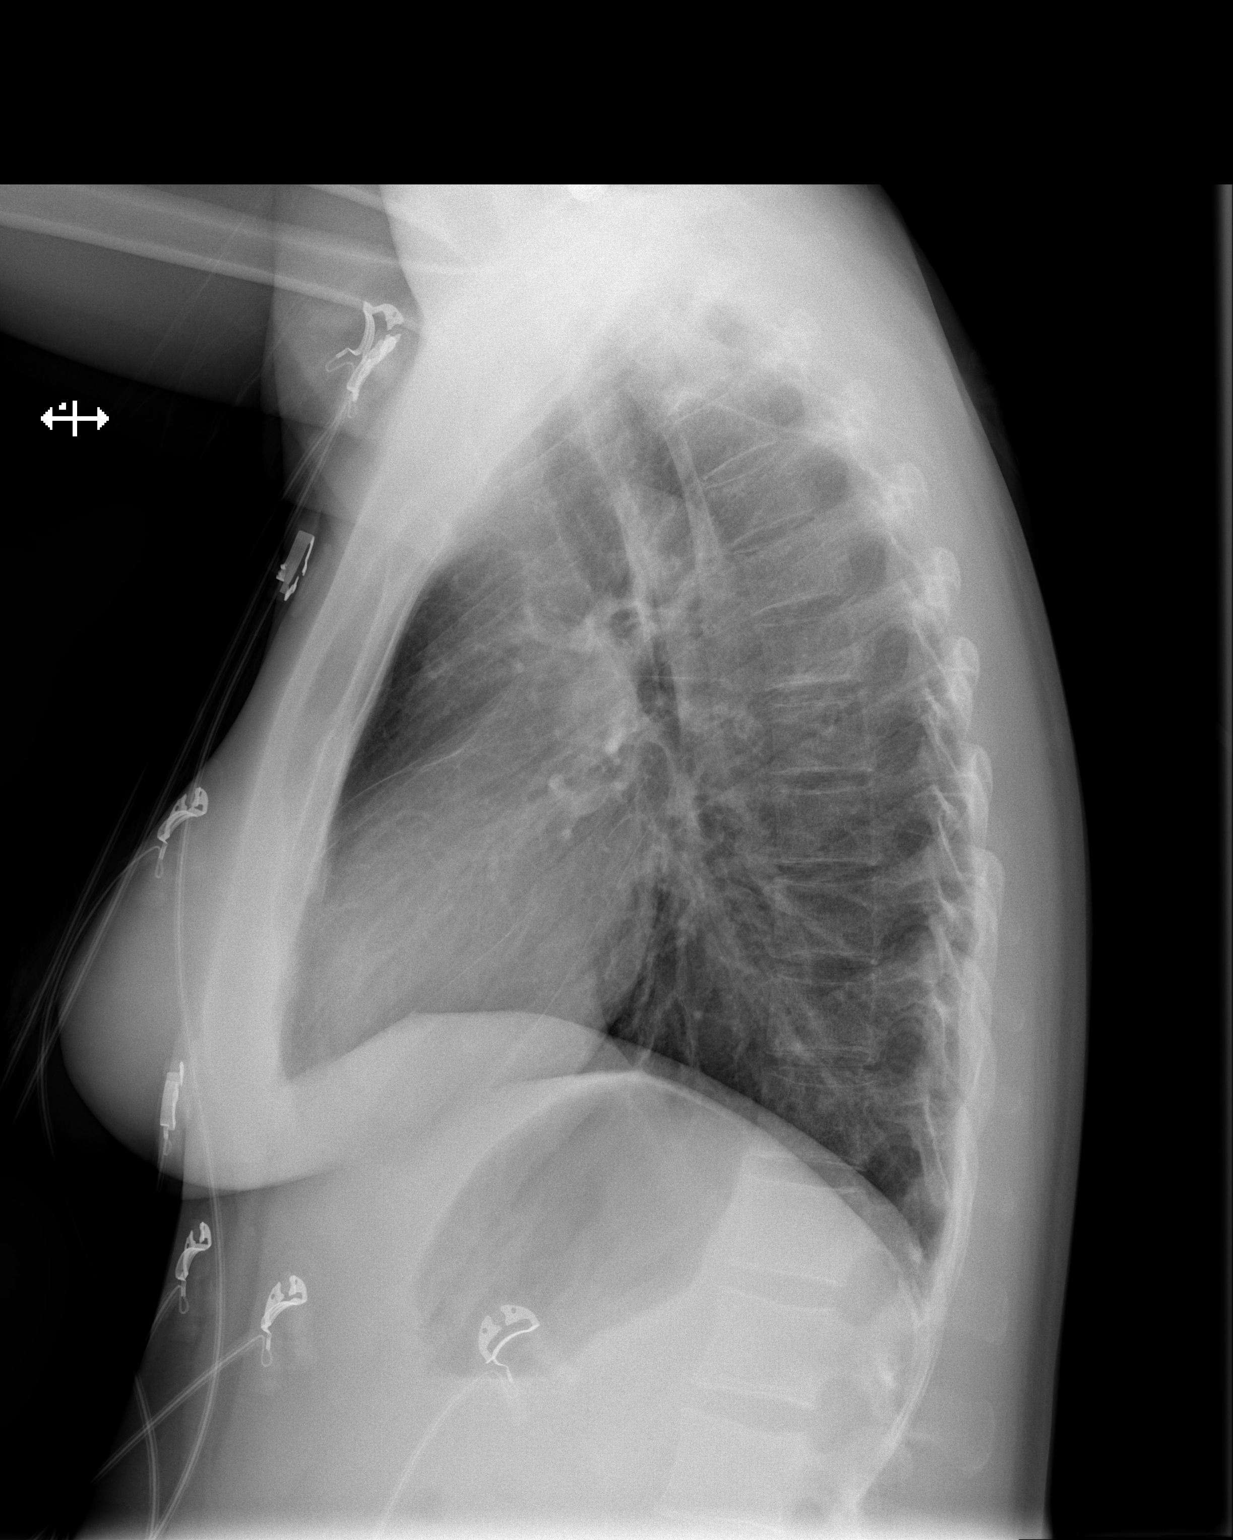

[2 of 2 positions shown; findings below may reference images not displayed]

FINDINGS: Mild cardiac enlargement without CHF or pneumonia.  No
effusion or pneumothorax.  Trachea midline.
IMPRESSION: Mild cardiomegaly without acute process

## 2012-04-11 MED ORDER — FENTANYL CITRATE 0.05 MG/ML IJ SOLN
100.0000 ug | Freq: Once | INTRAMUSCULAR | Status: AC
  Administered 2012-04-11: 100 ug via INTRAVENOUS
  Filled 2012-04-11: qty 2

## 2012-04-11 MED ORDER — SODIUM CHLORIDE 0.9 % IV BOLUS (SEPSIS)
1000.0000 mL | Freq: Once | INTRAVENOUS | Status: AC
  Administered 2012-04-11: 1000 mL via INTRAVENOUS

## 2012-04-11 MED ORDER — TRAMADOL HCL 50 MG PO TABS: 50.0000 mg | ORAL_TABLET | Freq: Four times a day (QID) | ORAL | Status: AC | PRN

## 2012-04-11 MED ORDER — CEPHALEXIN 500 MG PO CAPS: 500.0000 mg | ORAL_CAPSULE | Freq: Four times a day (QID) | ORAL | Status: AC

## 2012-04-11 MED ORDER — INSULIN ASPART 100 UNIT/ML ~~LOC~~ SOLN
4.0000 [IU] | Freq: Once | SUBCUTANEOUS | Status: AC
  Administered 2012-04-11: 4 [IU] via INTRAVENOUS
  Filled 2012-04-11: qty 1

## 2012-04-11 MED ORDER — ONDANSETRON HCL 4 MG/2ML IJ SOLN
4.0000 mg | Freq: Once | INTRAMUSCULAR | Status: AC
  Administered 2012-04-11: 4 mg via INTRAVENOUS
  Filled 2012-04-11: qty 2

## 2012-04-11 MED ORDER — KETOROLAC TROMETHAMINE 30 MG/ML IJ SOLN
30.0000 mg | Freq: Once | INTRAMUSCULAR | Status: AC
Start: 2012-04-11 — End: 2012-04-11
  Administered 2012-04-11: 30 mg via INTRAVENOUS
  Filled 2012-04-11: qty 1

## 2012-04-11 NOTE — ED Provider Notes (Addendum)
History     CSN: [id number]  Arrival date & time 04/10/12  2311   First MD Initiated Contact with Patient 04/11/12 0006      Chief Complaint  Patient presents with  . Nausea  . Emesis    (Consider location/radiation/quality/duration/timing/severity/associated sxs/prior treatment) Patient is a 31 y.o. female presenting with vomiting. The history is provided by the patient. No language interpreter was used.  Emesis  This is a recurrent problem. The current episode started 6 to 12 hours ago. The problem occurs 2 to 4 times per day. The problem has not changed since onset.The emesis has an appearance of stomach contents. The maximum temperature recorded prior to her arrival was 100 to 100.9 F. Associated symptoms include chills and myalgias. Pertinent negatives include no abdominal pain, no arthralgias, no cough, no diarrhea, no headaches, no sweats and no URI. Risk factors: none.    Past Medical History  Diagnosis Date  . DIABETES MELLITUS, TYPE I, UNCONTROLLED 11/05/2007  . MEDIAL EPICONDYLITIS, LEFT 12/17/2007  . Vision loss   . Renal disorder   . Diabetes mellitus   . Hypertension     Past Surgical History  Procedure Date  . Refractive surgery   . Eye surgery   . Av fistula placement 11/17/2011    Procedure: ARTERIOVENOUS (AV) FISTULA CREATION;  Surgeon: Rosetta Posner, MD;  Location: [facility] OR;  Service: Vascular;  Laterality: Right;    Family History  Problem Relation Age of Onset  . Hypertension Father     History  Substance Use Topics  . Smoking status: Former Smoker -- 1 years    Types: Pipe  . Smokeless tobacco: Never Used   Comment: Hooka  . Alcohol Use: No    OB History    Grav Para Term Preterm Abortions TAB SAB Ect Mult Living                  Review of Systems  Constitutional: Positive for chills.  HENT: Negative for sore throat and voice change.   Respiratory: Negative for cough and shortness of breath.   Cardiovascular: Negative for chest pain,  palpitations and leg swelling.  Gastrointestinal: Positive for vomiting. Negative for abdominal pain and diarrhea.  Musculoskeletal: Positive for myalgias. Negative for joint swelling and arthralgias.  Skin: Negative for rash.  Neurological: Negative for headaches.  Hematological: Negative.   Psychiatric/Behavioral: Negative.   All other systems reviewed and are negative.    Allergies  Shrimp  Home Medications   Current Outpatient Rx  Name Route Sig Dispense Refill  . ACETAMINOPHEN 500 MG PO TABS Oral Take 1,000 mg by mouth every 6 (six) hours as needed. For pain    . RENA-VITE PO Oral Take 1 tablet by mouth daily.    Marland Kitchen BRIMONIDINE TARTRATE 0.15 % OP SOLN Right Eye Place 1 drop into the right eye 2 (two) times daily.    Marland Kitchen CARVEDILOL 25 MG PO TABS Oral Take 25 mg by mouth 2 (two) times daily with a meal.    . DILTIAZEM HCL ER COATED BEADS 240 MG PO CP24 Oral Take 240 mg by mouth daily.    Marland Kitchen HYDRALAZINE HCL 50 MG PO TABS Oral Take 50 mg by mouth 2 (two) times daily.    . INSULIN LISPRO PROT & LISPRO (75-25) 100 UNIT/ML [place] SUSP Subcutaneous Inject 8-10 Units into the skin 2 (two) times daily with a meal. 8 in a.m. 10 in p.m.    . KETOROLAC TROMETHAMINE 0.4 % OP SOLN  Right Eye Place 1 drop into the right eye 4 (four) times daily.    Marland Kitchen LEVOTHYROXINE SODIUM 50 MCG PO TABS Oral Take 50 mcg by mouth daily.    Marland Kitchen LOSARTAN POTASSIUM 100 MG PO TABS Oral Take 100 mg by mouth daily.    . OFLOXACIN 0.3 % OP SOLN Both Eyes Place 1 drop into both eyes 4 (four) times daily.    Marland Kitchen OMEPRAZOLE 20 MG PO CPDR Oral Take 20 mg by mouth daily.    . OXYCODONE-ACETAMINOPHEN 7.5-325 MG PO TABS Oral Take 1 tablet by mouth every 6 (six) hours as needed. For pain    . PREDNISOLONE ACETATE 1 % OP SUSP Right Eye Place 1 drop into the right eye 4 (four) times daily.    . TRAMADOL HCL 50 MG PO TABS Oral Take 50 mg by mouth every 6 (six) hours as needed. For pain      BP 150/72  Pulse 100  Temp 99.3 F (37.4 C)  (Oral)  Resp 18  SpO2 100%  LMP 03/10/2012  Physical Exam  Constitutional: She is oriented to person, place, and time. She appears well-developed and well-nourished. No distress.  HENT:  Head: Normocephalic and atraumatic.  Mouth/Throat: Oropharynx is clear and moist.  Eyes: Pupils are equal, round, and reactive to light. Right eye exhibits no chemosis and no discharge. Left eye exhibits no chemosis and no discharge. Right conjunctiva has a hemorrhage. Left conjunctiva is not injected. Scleral icterus: right eye with scleral hemorrhage secondary to recent surgery no discharge EOMI   Neck: Normal range of motion. Neck supple.  Cardiovascular: Normal rate and regular rhythm.   Pulmonary/Chest: Effort normal and breath sounds normal. She has no wheezes. She has no rales.  Abdominal: Soft. Bowel sounds are normal. There is no tenderness. There is no rebound and no guarding.  Musculoskeletal: Normal range of motion.  Neurological: She is alert and oriented to person, place, and time. She has normal reflexes.  Skin: Skin is warm and dry.  Psychiatric: She has a normal mood and affect.    ED Course  Procedures (including critical care time)  Labs Reviewed  POCT I-STAT, CHEM 8 - Abnormal; Notable for the following:    Sodium 131 (*)     BUN 87 (*)     Creatinine, Ser 8.30 (*)     Glucose, Bld 391 (*)     Calcium, Ion 1.07 (*)     Hemoglobin 9.5 (*)     HCT 28.0 (*)     All other components within normal limits  CBC WITH DIFFERENTIAL  URINALYSIS, ROUTINE W REFLEX MICROSCOPIC  URINE CULTURE  LACTIC ACID, PLASMA  CULTURE, BLOOD (ROUTINE X 2)  CULTURE, BLOOD (ROUTINE X 2)   No results found.   No diagnosis found.    MDM  Anion gap 14 without ketones.  Always has myalgias.  Will treat for UTI.  Patient told to call eye doctor who performed surgery regarding temp 100.  Return for fever> 101, weakness, low BP, vomiting, hyperglycemia or any concerns.  Follow up immediately at  dialysis.  Patient verbalizes understanding and agrees to follow up     Date: 05/10/2012  Rate: 89  Rhythm: normal sinus rhythm  QRS Axis: normal  Intervals: normal  ST/T Wave abnormalities: normal  Conduction Disutrbances: none  Narrative Interpretation: unremarkable        Taha Dimond K Derryl Uher-Rasch, MD 04/11/12 0622  Janzen Sacks K Wylie Russon-Rasch, MD 05/10/12 [phone]

## 2012-04-11 NOTE — ED Notes (Signed)
Restricted extremity armband to right arm

## 2012-04-12 LAB — URINE CULTURE: Colony Count: NO GROWTH

## 2012-04-17 LAB — CULTURE, BLOOD (ROUTINE X 2): Culture: NO GROWTH

## 2012-09-04 DIAGNOSIS — Z940 Kidney transplant status: Secondary | ICD-10-CM

## 2012-09-04 HISTORY — DX: Kidney transplant status: Z94.0

## 2012-10-04 ENCOUNTER — Ambulatory Visit
Admission: RE | Admit: 2012-10-04 | Discharge: 2012-10-04 | Disposition: A | Discharge status: Home / Self Care | Payer: Medicaid Other | Source: Ambulatory Visit | Attending: Nephrology | Admitting: Nephrology

## 2012-10-04 ENCOUNTER — Other Ambulatory Visit: Payer: Self-pay | Admitting: Nephrology

## 2012-10-04 DIAGNOSIS — R0602 Shortness of breath: Secondary | ICD-10-CM

## 2012-10-04 DIAGNOSIS — R05 Cough: Secondary | ICD-10-CM

## 2012-10-04 IMAGING — CR DG CHEST 2V
2 series · 2 of 2 positions shown · non-contrast
Comparison: [DATE]; [DATE]

CLINICAL DATA: Cough and shortness of breath for 2 weeks,
occasional smoker

CHEST - 2 VIEW

[w chest pa]
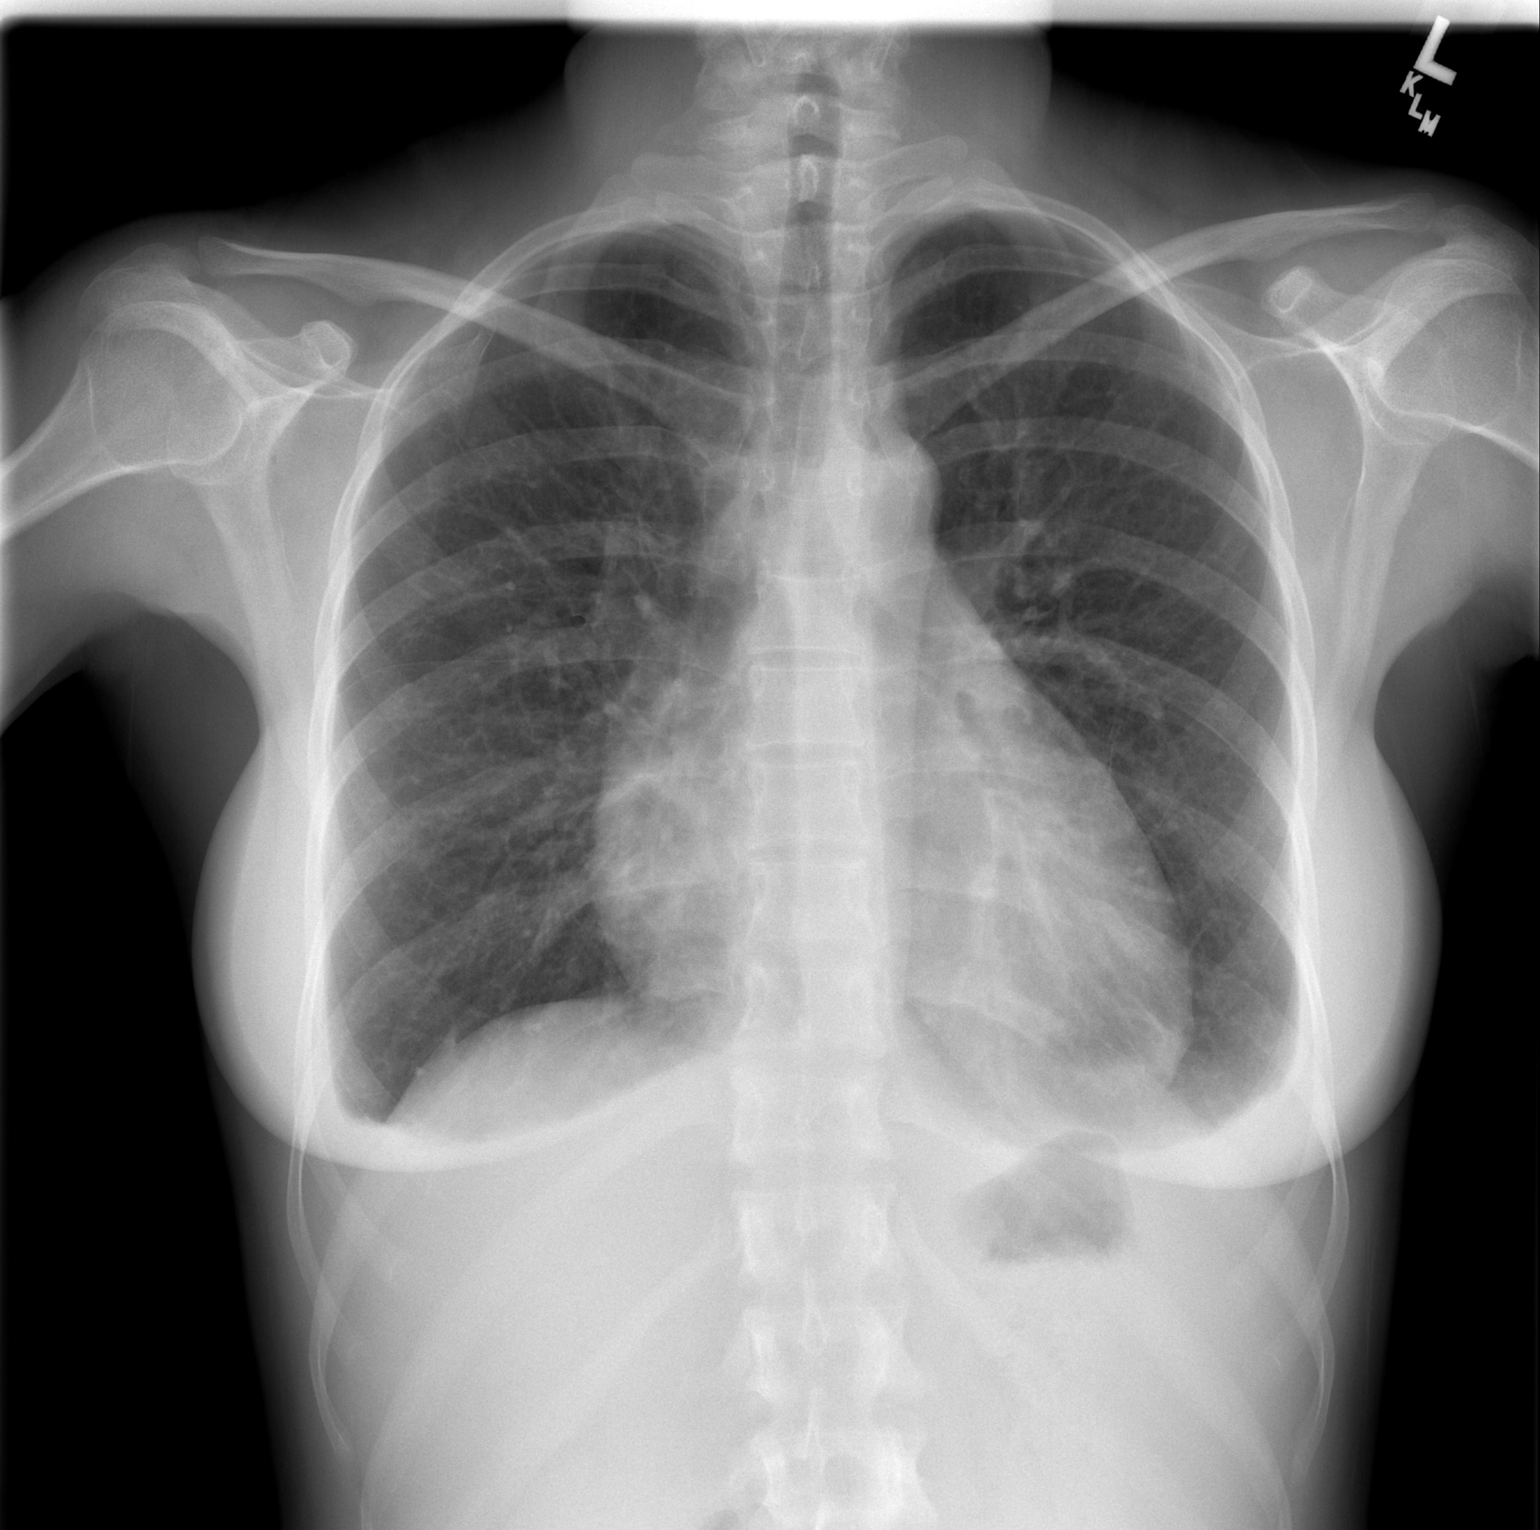

[w chest lat]
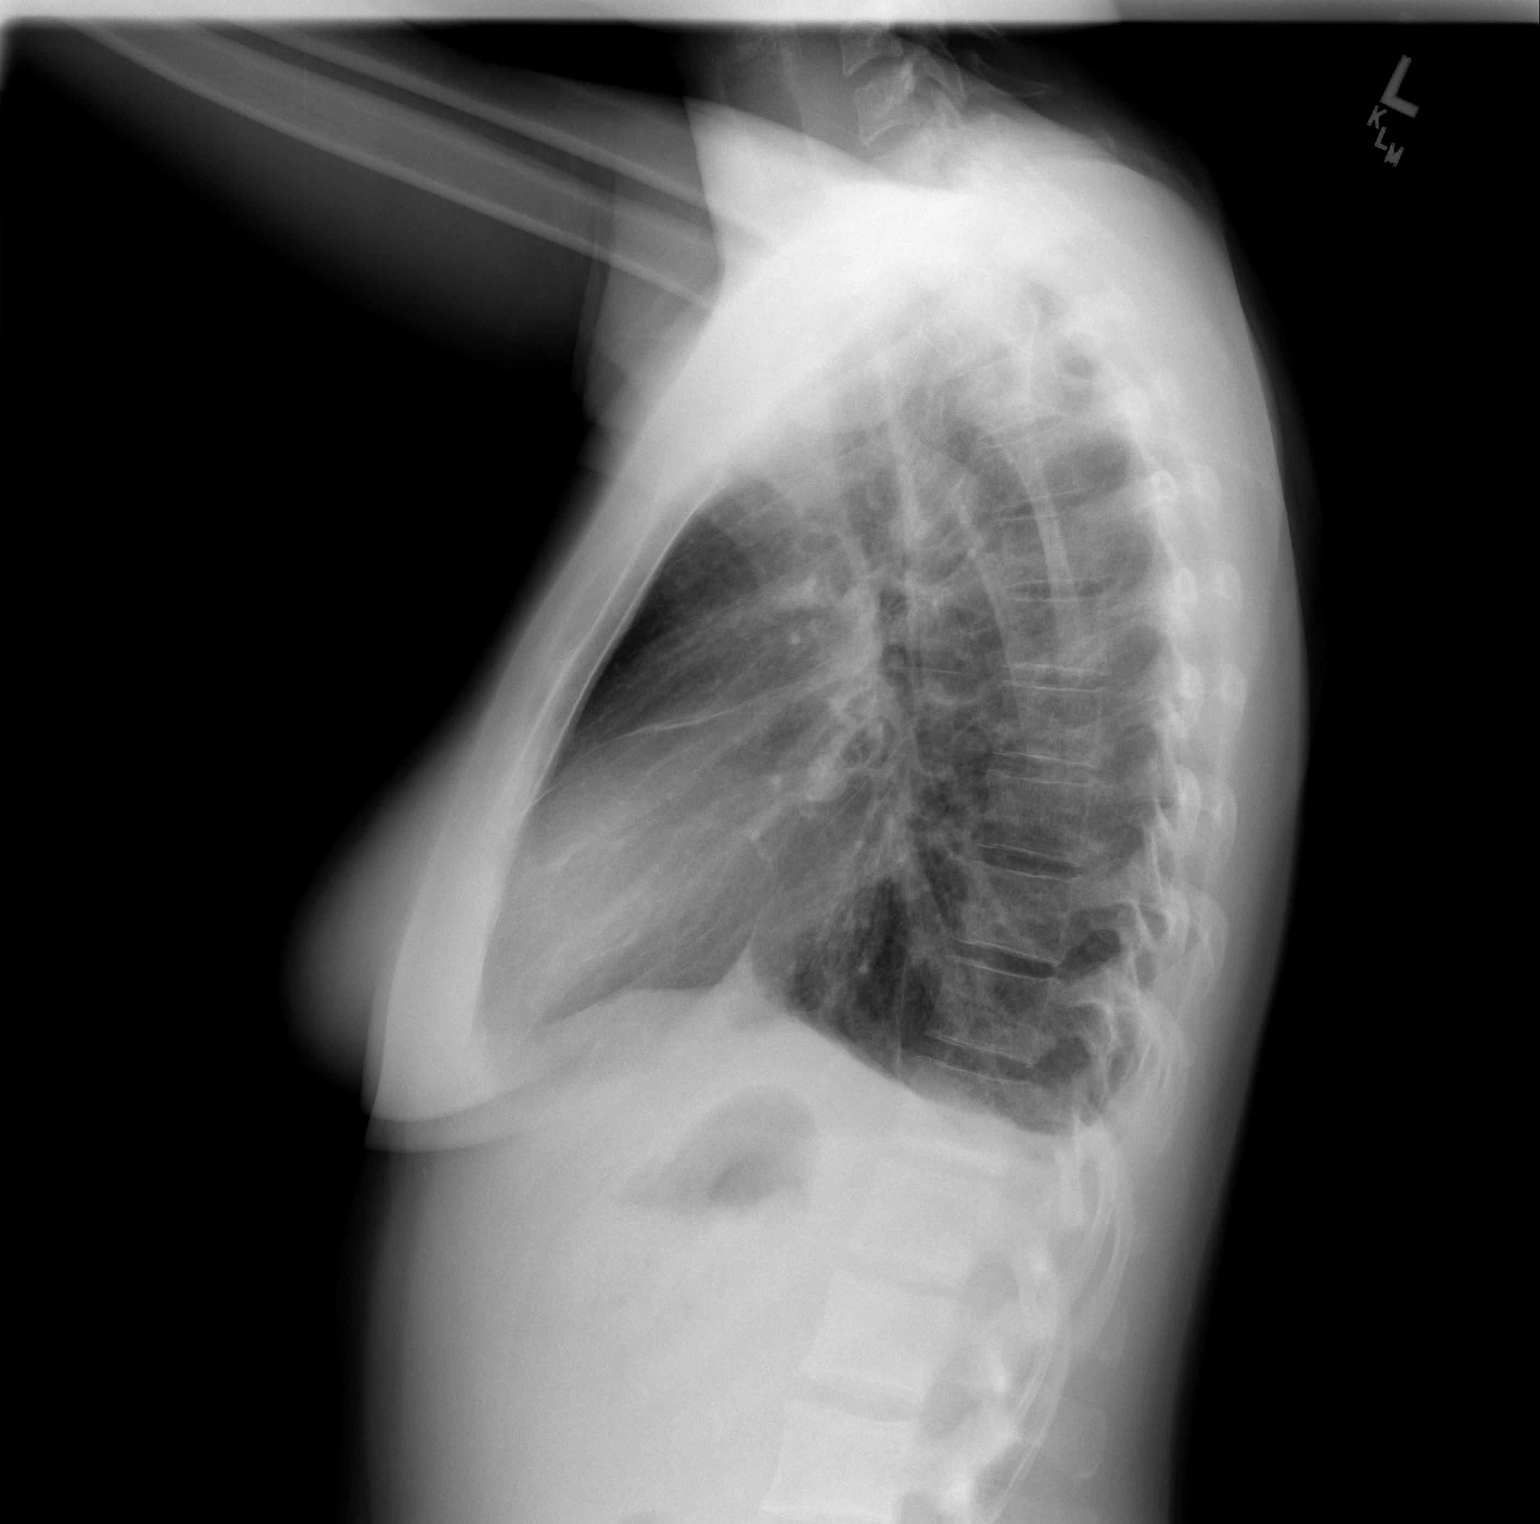

[2 of 2 positions shown; findings below may reference images not displayed]

FINDINGS: Grossly unchanged enlarged cardiac silhouette.  Normal mediastinal
contours.  Interval reaccumulation of small bilateral pleural
effusions and associated bibasilar opacities.  Mild cephalization
of flow without frank evidence of edema.  No pneumothorax.
Unchanged bones.
IMPRESSION: 1.  Interval reaccumulation of small bilateral effusions and
associated bibasilar opacities, possibly atelectasis.
2.  Pulmonary venous congestion without frank evidence of edema.
3.  Grossly unchanged enlarged cardiac silhouette.

## 2012-10-22 ENCOUNTER — Other Ambulatory Visit: Payer: Self-pay | Admitting: Nephrology

## 2012-10-22 ENCOUNTER — Ambulatory Visit
Admission: RE | Admit: 2012-10-22 | Discharge: 2012-10-22 | Disposition: A | Discharge status: Home / Self Care | Payer: Medicaid Other | Source: Ambulatory Visit | Attending: Nephrology | Admitting: Nephrology

## 2012-10-22 DIAGNOSIS — M25541 Pain in joints of right hand: Secondary | ICD-10-CM

## 2012-10-22 IMAGING — CR DG FINGER THUMB 2+V*R*
1 series · 1 of 1 positions shown · non-contrast
Comparison: None.

CLINICAL DATA: Right thumb pain post injury

RIGHT THUMB 2+V

[view not recorded]
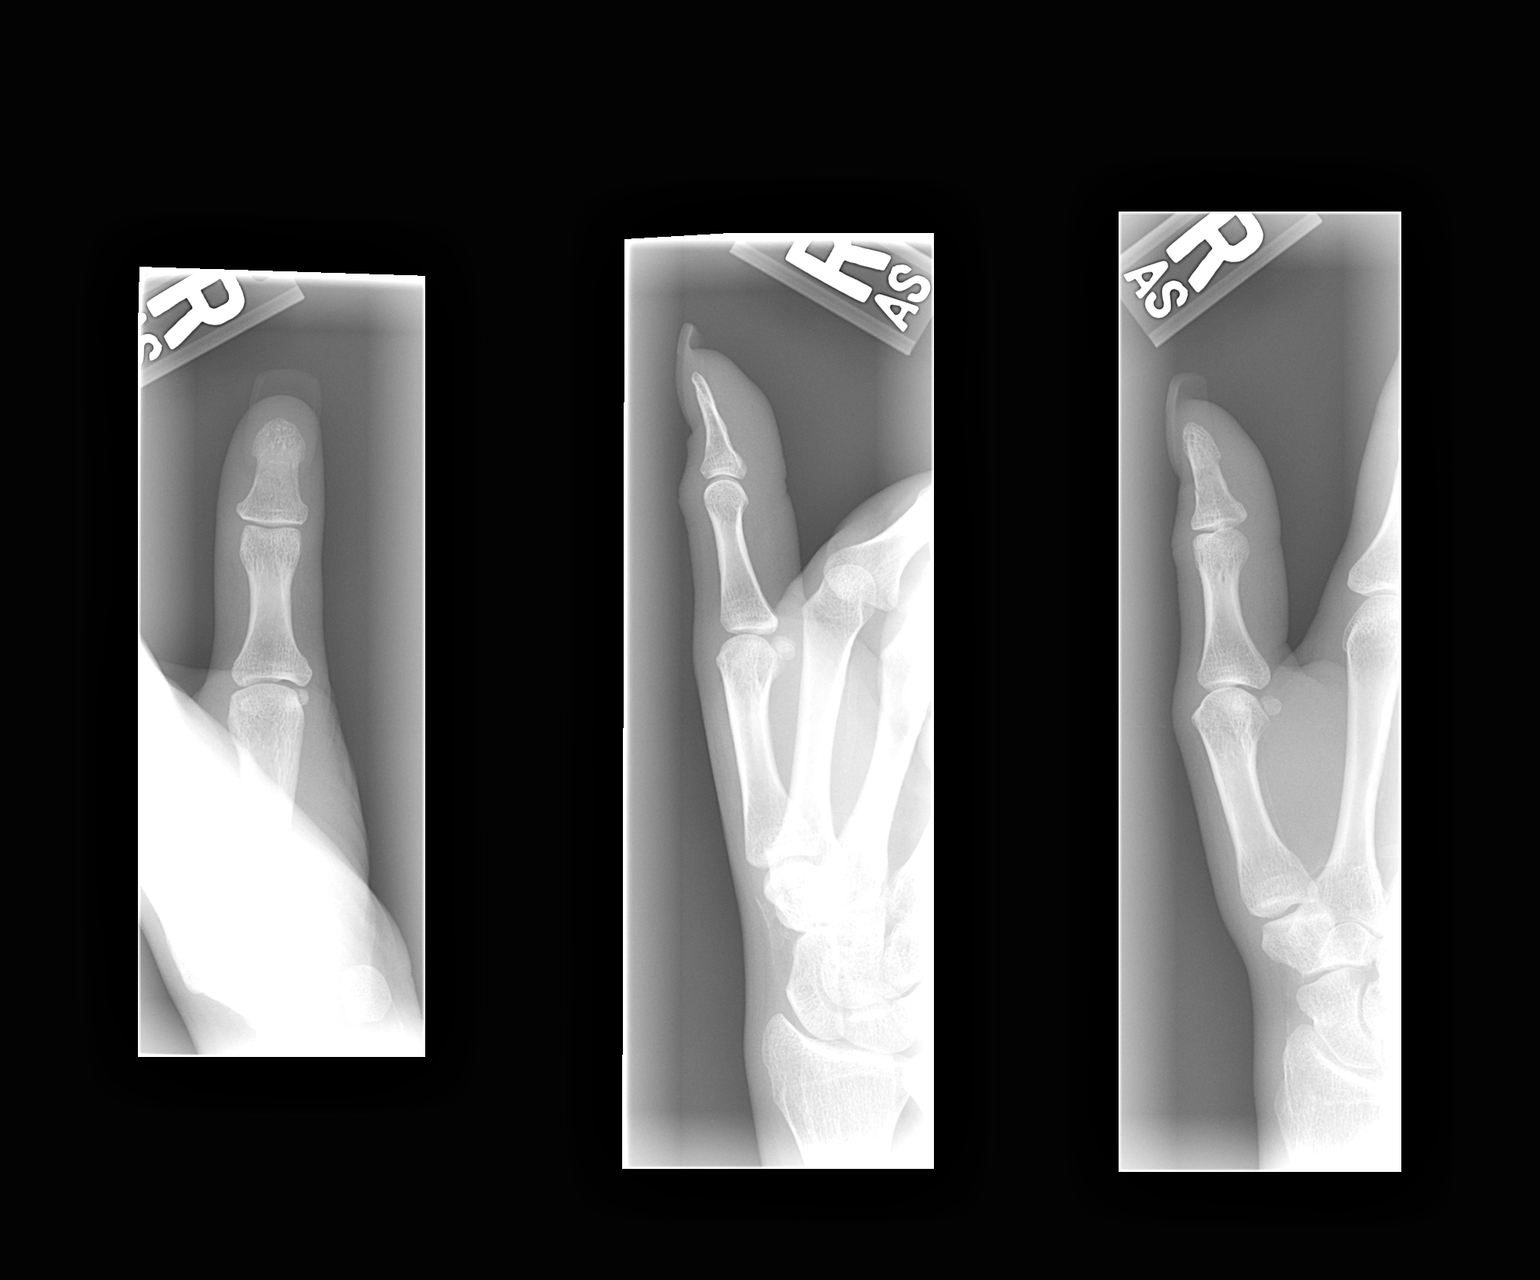

[1 of 1 positions shown; findings below may reference images not displayed]

FINDINGS: Three views of the right thumb submitted.  No displaced
fracture or subluxation.  There is a subtle lucency in one of the
sesamoid bones adjacent to distal first metacarpal.  Subtle
nondisplaced fracture cannot be excluded.  Clinical correlation is
necessary.
IMPRESSION: No displaced fracture or subluxation.  There is a subtle lucency in
one of the sesamoid bones adjacent to distal first metacarpal.
Subtle nondisplaced fracture cannot be excluded.  Clinical
correlation is necessary.

## 2012-11-13 ENCOUNTER — Ambulatory Visit (HOSPITAL_BASED_OUTPATIENT_CLINIC_OR_DEPARTMENT_OTHER): Payer: Medicare Other | Attending: Nephrology | Admitting: Radiology

## 2012-11-13 VITALS — Ht 62.0 in | Wt 117.0 lb

## 2012-11-13 DIAGNOSIS — Z79899 Other long term (current) drug therapy: Secondary | ICD-10-CM | POA: Insufficient documentation

## 2012-11-13 DIAGNOSIS — I498 Other specified cardiac arrhythmias: Secondary | ICD-10-CM | POA: Insufficient documentation

## 2012-11-13 DIAGNOSIS — G4733 Obstructive sleep apnea (adult) (pediatric): Secondary | ICD-10-CM | POA: Insufficient documentation

## 2012-11-13 DIAGNOSIS — G4730 Sleep apnea, unspecified: Secondary | ICD-10-CM

## 2012-11-16 DIAGNOSIS — R0989 Other specified symptoms and signs involving the circulatory and respiratory systems: Secondary | ICD-10-CM

## 2012-11-16 DIAGNOSIS — R0609 Other forms of dyspnea: Secondary | ICD-10-CM

## 2012-11-16 DIAGNOSIS — G4733 Obstructive sleep apnea (adult) (pediatric): Secondary | ICD-10-CM

## 2012-11-17 NOTE — Procedures (Signed)
[name] [name], [name] [name]                 ACCOUNT NO.:  [id number]  MEDICAL RECORD NO.:  [id number]          PATIENT TYPE:  OUT  LOCATION:  SLEEP CENTER                 FACILITY:  [facility]  PHYSICIAN:  Clinton D. Annamaria Boots, MD, FCCP, FACPDATE OF BIRTH:  17-Dec-1981  DATE OF STUDY:  11/13/2012                           NOCTURNAL POLYSOMNOGRAM  REFERRING PHYSICIAN:  Windy Kalata, M.D.  INDICATION FOR STUDY:  Hypersomnia with sleep apnea.  EPWORTH SLEEPINESS SCORE:  4/24.  BMI 21.4, weight 117 pounds, height 62 inches, neck 12.5 inches.  MEDICATIONS:  Home medications are charted and reviewed.  SLEEP ARCHITECTURE:  Total sleep time 283 minutes with sleep efficiency 79.6%.  Stage I was 2.7%, stage II 63.3%, stage III 26.9%, REM 7.2% of total sleep time.  Sleep latency 26 minutes.  REM latency 189.5 minutes, awake after sleep onset 45 minutes.  Arousal index 11.9.  BEDTIME MEDICATIONS:  Humalog, hydralazine, losartan, diltiazem, carvedilol.  RESPIRATORY DATA:  Apnea-hypopnea index (AHI) 8.9 per hour.  A total of 42 events were scored including 1 obstructive apnea and 41 hypopneas. Most events were associated with supine sleep position and REM.  REM AHI 35.1 per hour.  This was a diagnostic NPSG protocol as ordered, without CPAP.  OXYGEN DATA:  On arrival, room air oxygen saturation was 88% while awake.  Oxygen desaturation to a nadir of 80% on room air with a total of 61.8 minutes recorded with saturation less than 88% on room air.  At 00:07 a.m., the technician added supplemental oxygen at 1 L/minute, subsequently increased to 1.5 L/minute.  Subsequent oxygen saturation was maintained in the 90% range with mean oxygen saturation through the study of 91.8%.  CARDIAC DATA:  Sinus tachycardia with mean heart rate 103 per minute.  MOVEMENT-PARASOMNIA:  No significant movement disturbance.  No bathroom trips.  IMPRESSIONS-RECOMMENDATIONS: 1. Sustained sleep was adequately maintained  after about 1:00 a.m.     Bedtime medications as noted above. 2. Mild obstructive sleep apnea/hypopnea syndrome, AHI 8.9 per hour.     REM AHI 35.1 per hour.  Events were more common while supine.  Mild     snoring with oxygen desaturation to a nadir of 80% and subsequent     initiation of supplemental oxygen at 1.5 L/minute, as described     above, for mean oxygen saturation through the study of 91.8%.  A     total of 61.8 minutes have been recorded with room air saturation     less than 88%. 3. This study was ordered as a diagnostic NPSG protocol without CPAP.     If conservative management is     insufficient, she can return for dedicated CPAP titration study if     appropriate.  Otherwise at a minimum, consider evaluation for     supplemental oxygen during sleep.     Clinton D. Annamaria Boots, MD, [facility], FACP Diplomate, [place] Board of Sleep Medicine    CDY/MEDQ  D:  11/16/2012 10:31:54  T:  11/17/2012 01:03:59  Job:  [id number]

## 2013-02-25 ENCOUNTER — Institutional Professional Consult (permissible substitution): Payer: Medicare Other | Admitting: Internal Medicine

## 2013-03-21 ENCOUNTER — Institutional Professional Consult (permissible substitution): Payer: Medicare Other | Admitting: Pulmonary Disease

## 2013-03-26 ENCOUNTER — Encounter: Payer: Self-pay | Admitting: Pulmonary Disease

## 2013-04-10 ENCOUNTER — Encounter: Payer: Self-pay | Admitting: Internal Medicine

## 2013-05-13 ENCOUNTER — Ambulatory Visit: Payer: Medicare Other | Admitting: Internal Medicine

## 2013-06-17 ENCOUNTER — Other Ambulatory Visit: Payer: Self-pay | Admitting: Obstetrics and Gynecology

## 2013-06-17 DIAGNOSIS — N644 Mastodynia: Secondary | ICD-10-CM

## 2013-06-17 DIAGNOSIS — N6310 Unspecified lump in the right breast, unspecified quadrant: Secondary | ICD-10-CM

## 2013-06-26 ENCOUNTER — Ambulatory Visit
Admission: RE | Admit: 2013-06-26 | Discharge: 2013-06-26 | Disposition: A | Discharge status: Home / Self Care | Payer: Medicare Other | Source: Ambulatory Visit | Attending: Obstetrics and Gynecology | Admitting: Obstetrics and Gynecology

## 2013-06-26 DIAGNOSIS — N6310 Unspecified lump in the right breast, unspecified quadrant: Secondary | ICD-10-CM

## 2013-06-26 DIAGNOSIS — N644 Mastodynia: Secondary | ICD-10-CM

## 2013-08-21 DIAGNOSIS — T8612 Kidney transplant failure: Secondary | ICD-10-CM | POA: Insufficient documentation

## 2013-08-21 DIAGNOSIS — IMO0001 Reserved for inherently not codable concepts without codable children: Secondary | ICD-10-CM | POA: Insufficient documentation

## 2013-08-30 DIAGNOSIS — R52 Pain, unspecified: Secondary | ICD-10-CM | POA: Insufficient documentation

## 2013-08-31 DIAGNOSIS — F1120 Opioid dependence, uncomplicated: Secondary | ICD-10-CM | POA: Insufficient documentation

## 2013-09-04 DIAGNOSIS — N289 Disorder of kidney and ureter, unspecified: Secondary | ICD-10-CM

## 2013-09-04 HISTORY — DX: Disorder of kidney and ureter, unspecified: N28.9

## 2013-09-12 DIAGNOSIS — Z9119 Patient's noncompliance with other medical treatment and regimen: Secondary | ICD-10-CM | POA: Insufficient documentation

## 2013-09-12 DIAGNOSIS — Z91199 Patient's noncompliance with other medical treatment and regimen due to unspecified reason: Secondary | ICD-10-CM | POA: Insufficient documentation

## 2013-09-25 DIAGNOSIS — E872 Acidosis, unspecified: Secondary | ICD-10-CM | POA: Insufficient documentation

## 2013-10-07 DIAGNOSIS — D7389 Other diseases of spleen: Secondary | ICD-10-CM | POA: Insufficient documentation

## 2013-10-14 ENCOUNTER — Encounter (HOSPITAL_COMMUNITY): Payer: Self-pay | Admitting: Emergency Medicine

## 2013-10-14 ENCOUNTER — Emergency Department (HOSPITAL_COMMUNITY)
Admission: EM | Admit: 2013-10-14 | Discharge: 2013-10-15 | Disposition: A | Discharge status: Other Acute Inpatient Hospital | Payer: Medicare Other | Attending: Emergency Medicine | Admitting: Emergency Medicine

## 2013-10-14 DIAGNOSIS — IMO0002 Reserved for concepts with insufficient information to code with codable children: Secondary | ICD-10-CM | POA: Insufficient documentation

## 2013-10-14 DIAGNOSIS — E1065 Type 1 diabetes mellitus with hyperglycemia: Secondary | ICD-10-CM | POA: Insufficient documentation

## 2013-10-14 DIAGNOSIS — R109 Unspecified abdominal pain: Secondary | ICD-10-CM

## 2013-10-14 DIAGNOSIS — Z8739 Personal history of other diseases of the musculoskeletal system and connective tissue: Secondary | ICD-10-CM | POA: Insufficient documentation

## 2013-10-14 DIAGNOSIS — K56609 Unspecified intestinal obstruction, unspecified as to partial versus complete obstruction: Secondary | ICD-10-CM | POA: Insufficient documentation

## 2013-10-14 DIAGNOSIS — Z7982 Long term (current) use of aspirin: Secondary | ICD-10-CM | POA: Insufficient documentation

## 2013-10-14 DIAGNOSIS — Z940 Kidney transplant status: Secondary | ICD-10-CM | POA: Insufficient documentation

## 2013-10-14 DIAGNOSIS — Z87448 Personal history of other diseases of urinary system: Secondary | ICD-10-CM | POA: Insufficient documentation

## 2013-10-14 DIAGNOSIS — R112 Nausea with vomiting, unspecified: Secondary | ICD-10-CM | POA: Insufficient documentation

## 2013-10-14 DIAGNOSIS — Z79899 Other long term (current) drug therapy: Secondary | ICD-10-CM | POA: Insufficient documentation

## 2013-10-14 DIAGNOSIS — R1110 Vomiting, unspecified: Secondary | ICD-10-CM

## 2013-10-14 DIAGNOSIS — Z3202 Encounter for pregnancy test, result negative: Secondary | ICD-10-CM | POA: Insufficient documentation

## 2013-10-14 DIAGNOSIS — Z9483 Pancreas transplant status: Secondary | ICD-10-CM | POA: Insufficient documentation

## 2013-10-14 DIAGNOSIS — Z87891 Personal history of nicotine dependence: Secondary | ICD-10-CM | POA: Insufficient documentation

## 2013-10-14 DIAGNOSIS — I10 Essential (primary) hypertension: Secondary | ICD-10-CM | POA: Insufficient documentation

## 2013-10-14 DIAGNOSIS — Z8669 Personal history of other diseases of the nervous system and sense organs: Secondary | ICD-10-CM | POA: Insufficient documentation

## 2013-10-14 LAB — CBC WITH DIFFERENTIAL/PLATELET
BASOS ABS: 0 10*3/uL (ref 0.0–0.1)
BASOS PCT: 1 % (ref 0–1)
EOS ABS: 0.2 10*3/uL (ref 0.0–0.7)
EOS PCT: 11 % — AB (ref 0–5)
HEMATOCRIT: 35.7 % — AB (ref 36.0–46.0)
HEMOGLOBIN: 12.2 g/dL (ref 12.0–15.0)
Lymphocytes Relative: 10 % — ABNORMAL LOW (ref 12–46)
Lymphs Abs: 0.2 10*3/uL — ABNORMAL LOW (ref 0.7–4.0)
MCH: 30.6 pg (ref 26.0–34.0)
MCHC: 34.2 g/dL (ref 30.0–36.0)
MCV: 89.5 fL (ref 78.0–100.0)
MONO ABS: 0.1 10*3/uL (ref 0.1–1.0)
MONOS PCT: 8 % (ref 3–12)
Neutro Abs: 1.2 10*3/uL — ABNORMAL LOW (ref 1.7–7.7)
Neutrophils Relative %: 71 % (ref 43–77)
Platelets: 191 10*3/uL (ref 150–400)
RBC: 3.99 MIL/uL (ref 3.87–5.11)
RDW: 15.9 % — AB (ref 11.5–15.5)
WBC: 1.7 10*3/uL — ABNORMAL LOW (ref 4.0–10.5)

## 2013-10-14 LAB — URINE MICROSCOPIC-ADD ON

## 2013-10-14 LAB — COMPREHENSIVE METABOLIC PANEL
ALBUMIN: 3.6 g/dL (ref 3.5–5.2)
ALT: 13 U/L (ref 0–35)
AST: 15 U/L (ref 0–37)
Alkaline Phosphatase: 100 U/L (ref 39–117)
BILIRUBIN TOTAL: 0.2 mg/dL — AB (ref 0.3–1.2)
BUN: 28 mg/dL — AB (ref 6–23)
CALCIUM: 9.7 mg/dL (ref 8.4–10.5)
CO2: 19 mEq/L (ref 19–32)
CREATININE: 1.13 mg/dL — AB (ref 0.50–1.10)
Chloride: 108 mEq/L (ref 96–112)
GFR calc Af Amer: 74 mL/min — ABNORMAL LOW (ref >90)
GFR calc non Af Amer: 64 mL/min — ABNORMAL LOW (ref >90)
Glucose, Bld: 108 mg/dL — ABNORMAL HIGH (ref 70–99)
Potassium: 4.8 mEq/L (ref 3.7–5.3)
Sodium: 141 mEq/L (ref 137–147)
TOTAL PROTEIN: 8.3 g/dL (ref 6.0–8.3)

## 2013-10-14 LAB — URINALYSIS, ROUTINE W REFLEX MICROSCOPIC
Bilirubin Urine: NEGATIVE
Glucose, UA: NEGATIVE mg/dL
KETONES UR: NEGATIVE mg/dL
NITRITE: NEGATIVE
PH: 5 (ref 5.0–8.0)
Protein, ur: 30 mg/dL — AB
SPECIFIC GRAVITY, URINE: 1.019 (ref 1.005–1.030)
Urobilinogen, UA: 0.2 mg/dL (ref 0.0–1.0)

## 2013-10-14 LAB — LIPASE, BLOOD: LIPASE: 33 U/L (ref 11–59)

## 2013-10-14 LAB — POCT PREGNANCY, URINE: Preg Test, Ur: NEGATIVE

## 2013-10-14 IMAGING — CT CT ABD-PELV W/O CM
2 of 5 series · 16 of 46 positions shown, 18 images · non-contrast
Comparison: US ABDOMEN COMPLETE dated [DATE]; IR FLUORO GUIDE CV
LINE*R* dated [DATE]; CT ABD/PELVIS W CM dated [DATE]

CLINICAL DATA: Left upper quadrant pain with emesis.

EXAM:
CT ABDOMEN AND PELVIS WITHOUT CONTRAST
TECHNIQUE: Multidetector CT imaging of the abdomen and pelvis was performed
following the standard protocol without intravenous contrast.

[Series 4: (person_name) thins · axial · 0.68mm/px · z∈[+70,+484]mm · 13 of 451 slices shown, 15 images]
[im 19/451  soft-tissue]
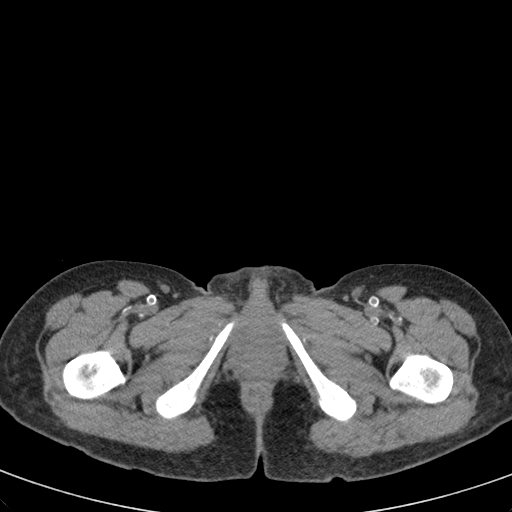
[im 19/451  bone]
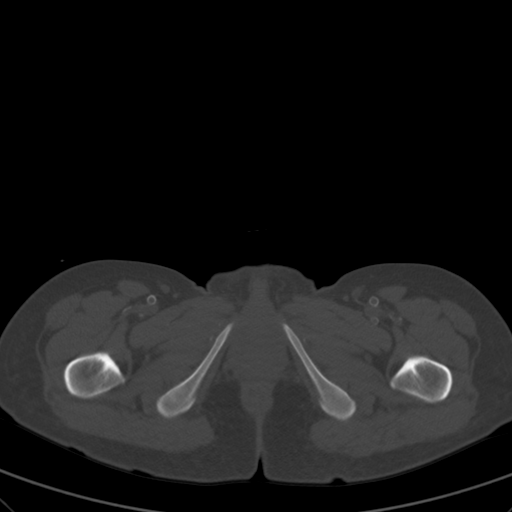
[im 55/451  soft-tissue]
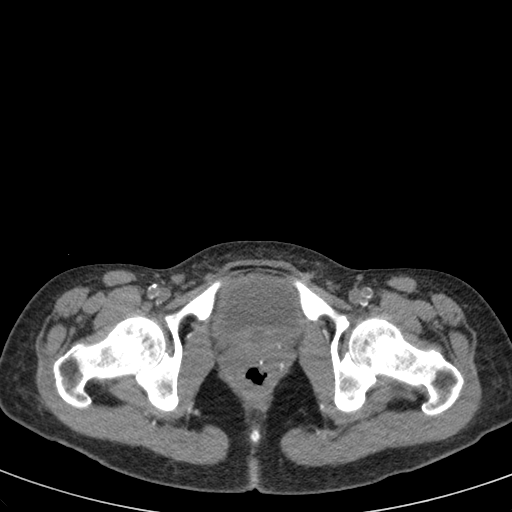
[im 91/451  soft-tissue]
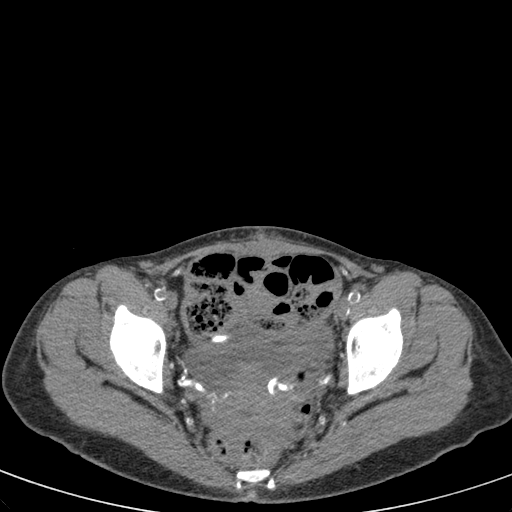
[im 127/451  soft-tissue]
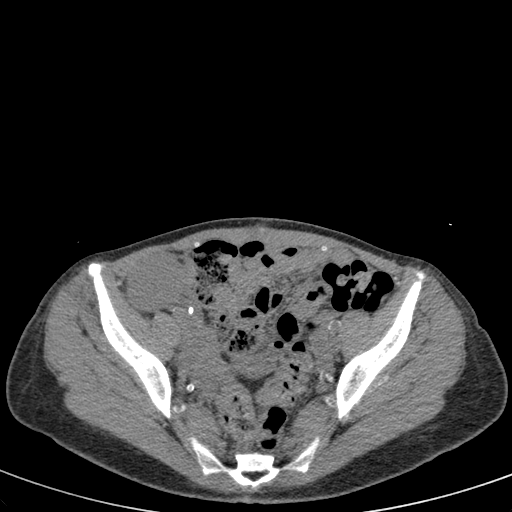
[im 163/451  soft-tissue]
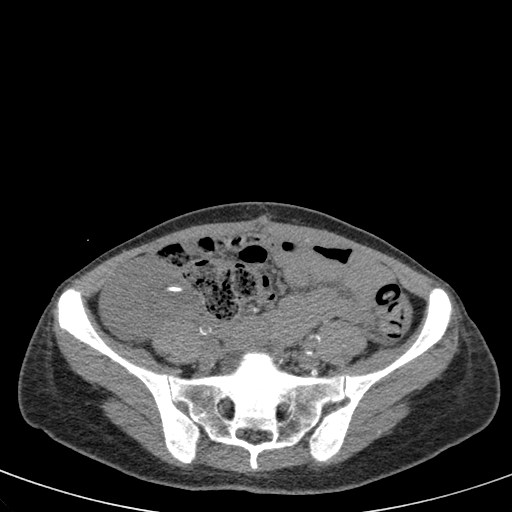
[im 199/451  soft-tissue]
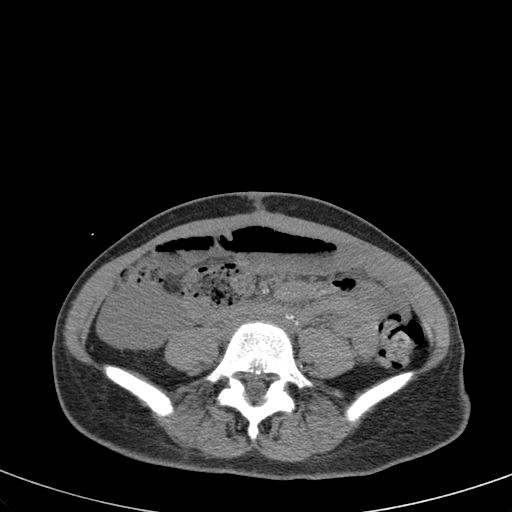
[im 235/451  soft-tissue]
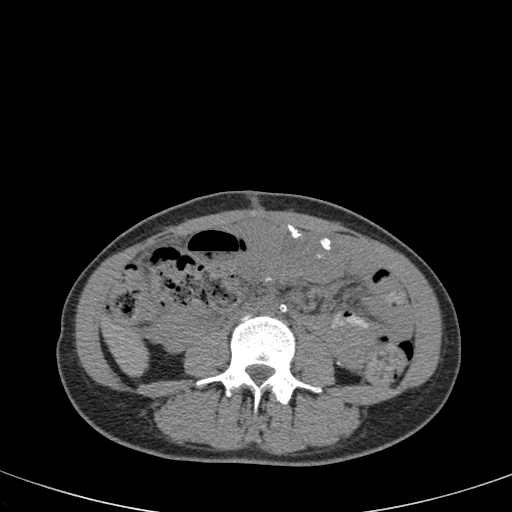
[im 253/451  soft-tissue]
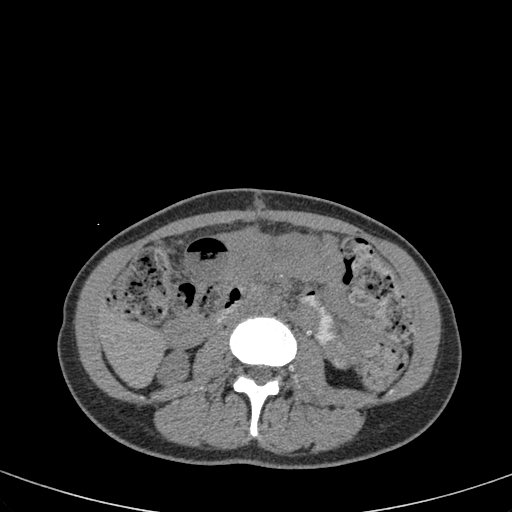
[im 289/451  soft-tissue]
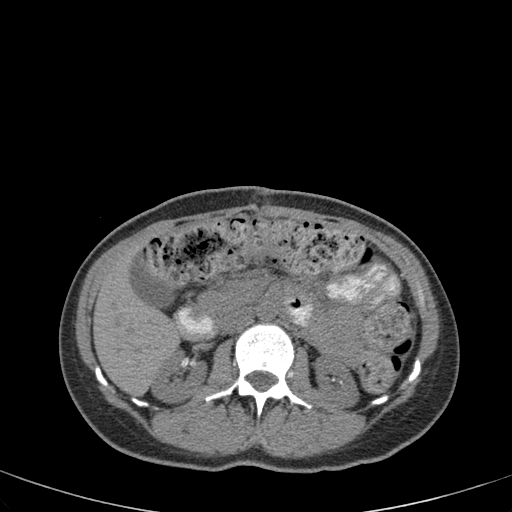
[im 289/451  bone]
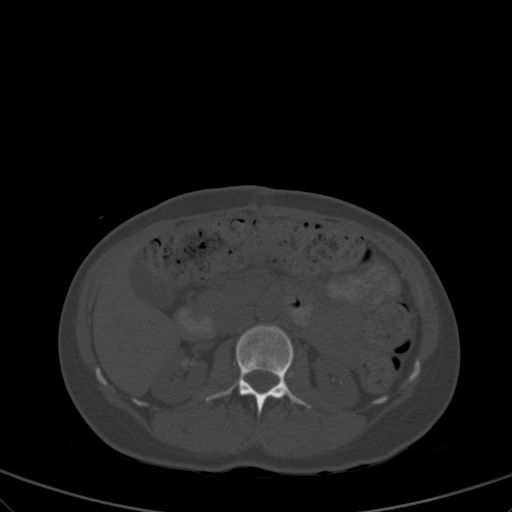
[im 325/451  soft-tissue]
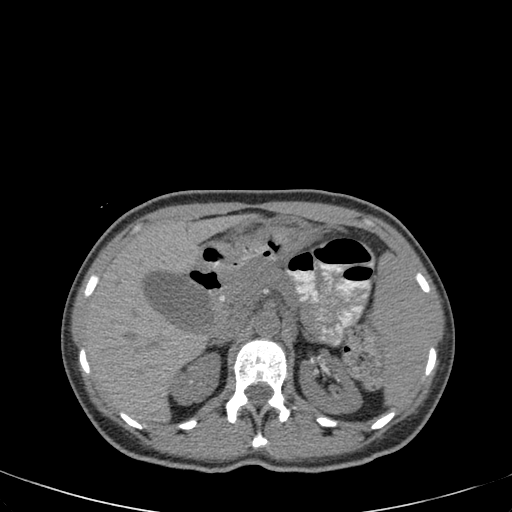
[im 361/451  soft-tissue]
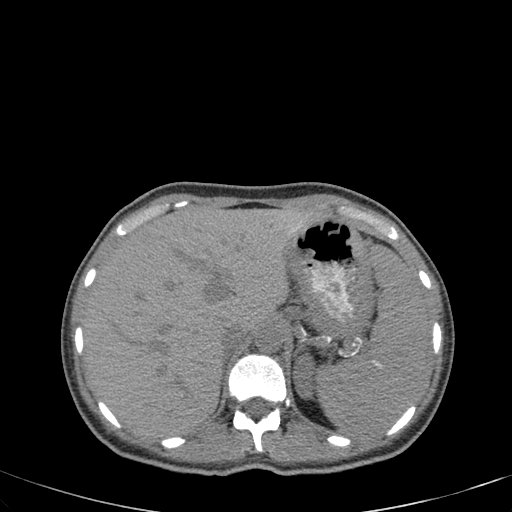
[im 397/451  soft-tissue]
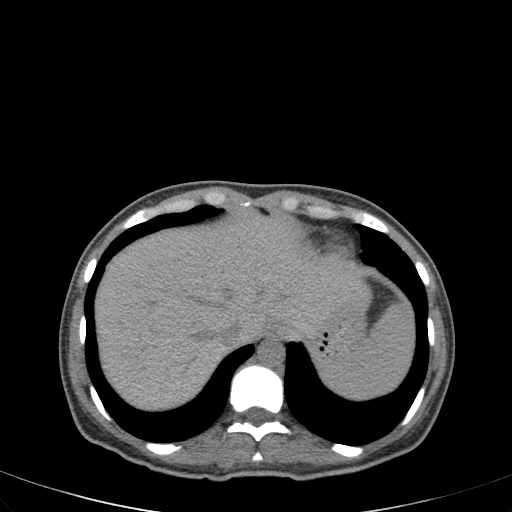
[im 433/451  soft-tissue]
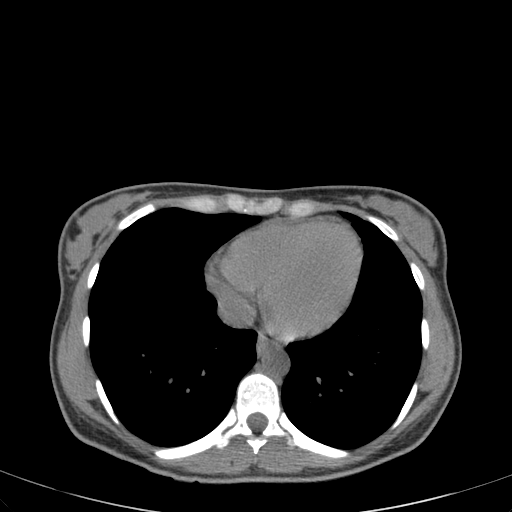

[mpr, coronals, coronal · coronal · 0.88mm/px · 3 of 76 slices shown]
[im 26/76  soft-tissue]
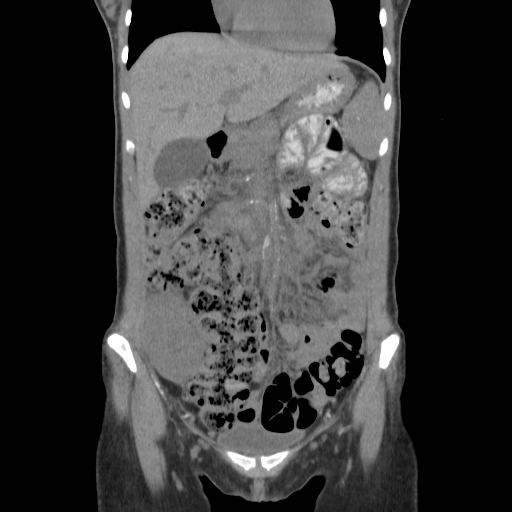
[im 34/76  soft-tissue]
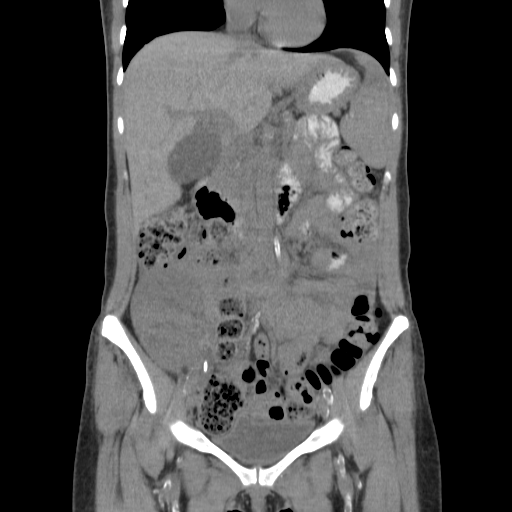
[im 42/76  soft-tissue]
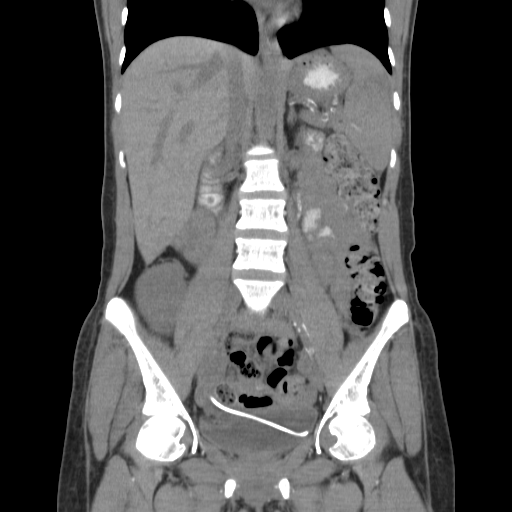

[16 of 46 positions shown; findings below may reference images not displayed]

FINDINGS: Lung Bases: Clear.

Liver: Unenhanced CT was performed per clinician order. Lack of IV
contrast limits sensitivity and specificity, especially for
evaluation of abdominal/pelvic solid viscera. Grossly normal.

Spleen:  Old granulomatous disease.

Gallbladder:  Distended.  No calcified stones.

Common bile duct:  Normal.

Pancreas:  Grossly normal.

Adrenal glands:  Normal bilaterally.

Kidneys: Extensive renal hilar vascular calcifications. Right renal
atrophy. No hydronephrosis. The ureters appear within normal limits.
Right iliac fossa a renal allograft is present with stent present.
The renal allograft demonstrates mild hydronephrosis which may be a
chronic finding. Double-J a ureteral stent extends into the urinary
bladder.

Stomach:  Grossly normal.

Small bowel: Duodenum appears normal. Air-fluid levels are present
within small bowel in the anterior abdomen and this appears adjacent
to postoperative changes. No convincing evidence of small bowel
obstruction allowing for lack of oral contrast. There is a metallic
or more likely calcified object in the anterior abdomen open (image
number 44 series 2, of unclear origin. This may represent
postoperative changes associated with prior bowel surgery.

Colon: No inflammatory changes of colon. Large stool burden.
Appendix not identified confidently.

Pelvic Genitourinary: Urinary bladder distended. Uterus appears
within normal limits.

Bones: Dense bones compatible with renal osteodystrophy. No
aggressive osseous lesions. Negative for AVN.

Vasculature: Extensive atherosclerosis, including dense visceral
atherosclerosis.

Body Wall: Scarring in the midline.  Negative for hernia.
IMPRESSION: 1. No acute abnormality.
2. Right iliac fossa renal allograft with double-J ureteral stent.
Mild hydronephrosis of the allograft is likely chronic based on the
presence of the stent.
3. Large stool burden suggesting constipation in the appropriate
clinical setting.
4. Mildly distended loops of small bowel with fluid levels in the
central abdomen. This may represent early obstruction or enteritis.
Lack of oral contrast precludes further assessment.

## 2013-10-14 MED ORDER — METOCLOPRAMIDE HCL 5 MG/ML IJ SOLN
10.0000 mg | Freq: Once | INTRAMUSCULAR | Status: AC
  Administered 2013-10-14: 10 mg via INTRAVENOUS
  Filled 2013-10-14: qty 2

## 2013-10-14 MED ORDER — IOHEXOL 300 MG/ML  SOLN
25.0000 mL | INTRAMUSCULAR | Status: AC
  Administered 2013-10-14 (×2): 25 mL via ORAL

## 2013-10-14 MED ORDER — ONDANSETRON HCL 4 MG/2ML IJ SOLN
4.0000 mg | Freq: Once | INTRAMUSCULAR | Status: AC
  Administered 2013-10-14: 4 mg via INTRAVENOUS
  Filled 2013-10-14: qty 2

## 2013-10-14 MED ORDER — HYDROMORPHONE HCL PF 1 MG/ML IJ SOLN
1.0000 mg | Freq: Once | INTRAMUSCULAR | Status: AC
  Administered 2013-10-14: 1 mg via INTRAVENOUS
  Filled 2013-10-14: qty 1

## 2013-10-14 MED ORDER — PROMETHAZINE HCL 25 MG/ML IJ SOLN
25.0000 mg | Freq: Once | INTRAMUSCULAR | Status: AC
  Administered 2013-10-14: 25 mg via INTRAVENOUS
  Filled 2013-10-14: qty 1

## 2013-10-14 NOTE — ED Notes (Signed)
Phenergan requested from pharmacy. CT notified pt vomited first cup of contrast and of delay.

## 2013-10-14 NOTE — ED Provider Notes (Signed)
CSN: [id number]     Arrival date & time 10/14/13  2027 History   First MD Initiated Contact with Patient 10/14/13 2056     Chief Complaint  Patient presents with  . 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 Patient states she had an episode of vomiting and immediately developed constant, sharp, severe left upper quadrant abdominal pain, radiating around towards her back. She has had 2 episodes of nonbloody, nonbilious vomiting. Denies fever, chills, chest pain or sob. She was seen at [facility] emergency department 2 days ago, had blood work and was told pain was due to gastroparesis, pain medication given in the emergency department that day completely resolved her symptoms until today. She was also seen at [facility] on January 31, had a CT scan showing possible splenic infarct, however abdominal pain at that time was noted more likely due to gastroparesis rather than splenic infarct. She was put on tramadol which patient states normally relieves her pain, however today provided no relief. She's currently taking cyclosporin, Myfortic, Bactrim, valganciclovir. She presents to this emergency Department today because she felt 2 days ago at [facility] she did not get any answers. All of her primary and specialty doctors are at [facility].  Patient is a 33 y.o. female presenting with abdominal pain. The history is provided by the patient and medical records.  Abdominal Pain Associated symptoms: nausea and vomiting     Past Medical History  Diagnosis Date  . DIABETES MELLITUS, TYPE I, UNCONTROLLED 11/05/2007  . MEDIAL EPICONDYLITIS, LEFT 12/17/2007  .  Vision loss   . Renal disorder   . Diabetes mellitus   . Hypertension    Past Surgical History  Procedure Laterality Date  . Refractive surgery    . Eye surgery    . Av fistula placement  11/17/2011    Procedure: ARTERIOVENOUS (AV) FISTULA CREATION;  Surgeon: Rosetta Posner, MD;  Location: [facility];  Service: Vascular;  Laterality: Right;  . Kidney transplant    . Pancrease transplant     Family History  Problem Relation Age of Onset  . Hypertension Father    History  Substance Use Topics  . Smoking status: Former Smoker -- 1 years    Types: Pipe  . Smokeless tobacco: Never Used     Comment: Hooka  . Alcohol Use: No   OB History   Grav Para Term Preterm Abortions TAB SAB Ect Mult Living                 Review of Systems  Gastrointestinal: Positive for nausea, vomiting and abdominal pain.  All other systems reviewed and are negative.      Allergies  Shrimp  Home Medications   Current Outpatient Rx  Name  Route  Sig  Dispense  Refill  . acetaminophen (TYLENOL) 500 MG tablet   Oral   Take 1,000 mg by mouth every 6 (six) hours as needed. For pain         . aspirin 81 MG tablet   Oral   Take 81 mg by mouth daily.         . cycloSPORINE (SANDIMMUNE) 100 MG capsule   Oral   Take 100 mg by mouth 2 (two) times daily.         Marland Kitchen  cycloSPORINE (SANDIMMUNE) 25 MG capsule   Oral   Take 25 mg by mouth 3 (three) times daily.         Marland Kitchen docusate sodium (COLACE) 100 MG capsule   Oral   Take 100 mg by mouth 2 (two) times daily.         . metoCLOPramide (REGLAN) 5 MG tablet   Oral   Take 5 mg by mouth See admin instructions. Take 2 tablet by mouth three times daily for 7 days. Started medication on 10-13-13         . mycophenolate (MYFORTIC) 180 MG EC tablet   Oral   Take 180 mg by mouth 3 (three) times daily.         Marland Kitchen omeprazole (PRILOSEC) 20 MG capsule   Oral   Take 20 mg by mouth daily as needed. For upset stomach         . polyethylene glycol  (MIRALAX / GLYCOLAX) packet   Oral   Take 17 g by mouth daily as needed for mild constipation.         . sulfamethoxazole-trimethoprim (BACTRIM,SEPTRA) 400-80 MG per tablet   Oral   Take 1 tablet by mouth 3 (three) times a week. Take on Mon wed and Friday. Patient finished medication on October 04, 2013         . valGANciclovir (VALCYTE) 450 MG tablet   Oral   Take 450 mg by mouth daily.         . traMADol (ULTRAM) 50 MG tablet   Oral   Take 50 mg by mouth every 6 (six) hours as needed for moderate pain. Take for 10 days. Started medication on finished medication today 10-14-13          BP 142/75  Pulse 83  Temp(Src) 97.9 F (36.6 C) (Oral)  Resp 18  SpO2 100%  LMP 10/14/2013 Physical Exam  Nursing note and vitals reviewed. Constitutional: She is oriented to person, place, and time. She appears well-developed and well-nourished. No distress.  Tearful.  HENT:  Head: Normocephalic and atraumatic.  Mouth/Throat: Oropharynx is clear and moist.  Eyes: Conjunctivae and EOM are normal. Pupils are equal, round, and reactive to light. No scleral icterus.  Neck: Normal range of motion. Neck supple.  Cardiovascular: Normal rate, regular rhythm, normal heart sounds and intact distal pulses.   Pulmonary/Chest: Effort normal and breath sounds normal. No respiratory distress.  Abdominal: Soft. Normal appearance and bowel sounds are normal. She exhibits no distension. There is tenderness in the periumbilical area, left upper quadrant and left lower quadrant. There is guarding. There is no rigidity, no rebound and no CVA tenderness. No hernia.  No peritoneal signs. Multiple well-healed surgical scars.  Musculoskeletal: Normal range of motion. She exhibits no edema.  Neurological: She is alert and oriented to person, place, and time.  Skin: Skin is warm and dry. She is not diaphoretic.  Psychiatric: She has a normal mood and affect. Her behavior is normal.    ED Course  Procedures  (including critical care time) Labs Review Labs Reviewed  CBC WITH DIFFERENTIAL - Abnormal; Notable for the following:    WBC 1.7 (*)    HCT 35.7 (*)    RDW 15.9 (*)    Neutro Abs 1.2 (*)    Lymphocytes Relative 10 (*)    Lymphs Abs 0.2 (*)    Eosinophils Relative 11 (*)    All other components within normal limits  COMPREHENSIVE METABOLIC PANEL - Abnormal; Notable for  the following:    Glucose, Bld 108 (*)    BUN 28 (*)    Creatinine, Ser 1.13 (*)    Total Bilirubin 0.2 (*)    GFR calc non Af Amer 64 (*)    GFR calc Af Amer 74 (*)    All other components within normal limits  URINALYSIS, ROUTINE W REFLEX MICROSCOPIC - Abnormal; Notable for the following:    APPearance CLOUDY (*)    Hgb urine dipstick LARGE (*)    Protein, ur 30 (*)    Leukocytes, UA SMALL (*)    All other components within normal limits  URINE MICROSCOPIC-ADD ON - Abnormal; Notable for the following:    Squamous Epithelial / LPF FEW (*)    Bacteria, UA FEW (*)    All other components within normal limits  LIPASE, BLOOD  POCT PREGNANCY, URINE   Imaging Review Ct Abdomen Pelvis Wo Contrast  10/15/2013   CLINICAL DATA:  Left upper quadrant pain with emesis.  EXAM: CT ABDOMEN AND PELVIS WITHOUT CONTRAST  TECHNIQUE: Multidetector CT imaging of the abdomen and pelvis was performed following the standard protocol without intravenous contrast.  COMPARISON:  US ABDOMEN COMPLETE dated 11/25/2011; IR FLUORO GUIDE CV LINE*R* dated 11/16/2011; CT ABD/PELVIS W CM dated 04/22/2010  FINDINGS: Lung Bases: Clear.  Liver: Unenhanced CT was performed per clinician order. Lack of IV contrast limits sensitivity and specificity, especially for evaluation of abdominal/pelvic solid viscera. Grossly normal.  Spleen:  Old granulomatous disease.  Gallbladder:  Distended.  No calcified stones.  Common bile duct:  Normal.  Pancreas:  Grossly normal.  Adrenal glands:  Normal bilaterally.  Kidneys: Extensive renal hilar vascular calcifications.  Right renal atrophy. No hydronephrosis. The ureters appear within normal limits. 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 The renal allograft demonstrates mild hydronephrosis which may be a chronic finding. Double-J a ureteral stent extends into the urinary bladder.  Stomach:  Grossly normal.  Small bowel: Duodenum appears normal. Air-fluid levels are present within small bowel in the anterior abdomen and this appears adjacent to postoperative changes. No convincing evidence of small bowel obstruction allowing for lack of oral contrast. There is a metallic or more likely calcified object in the anterior abdomen open (image number 44 series 2, of unclear origin. This may represent postoperative changes associated with prior bowel surgery.  Colon: No inflammatory changes of colon. Large stool burden. Appendix not identified confidently.  Pelvic Genitourinary: Urinary bladder distended. Uterus appears within normal limits.  Bones: Dense bones compatible with renal osteodystrophy. No aggressive osseous lesions. Negative for AVN.  Vasculature: Extensive atherosclerosis, including dense visceral atherosclerosis.  Body Wall: Scarring in the midline.  Negative for hernia.  IMPRESSION: 1. No acute abnormality. 2. Right iliac fossa renal allograft with double-J ureteral stent. Mild hydronephrosis of the allograft is likely chronic based on the presence of the stent. 3. Large stool burden suggesting constipation in the appropriate clinical setting. 4. Mildly distended loops of small bowel with fluid levels in the central abdomen. This may represent early obstruction or enteritis. Lack of oral contrast precludes further assessment.   Electronically Signed   By: Dereck Ligas M.D.   On: 10/15/2013 00:40    EKG Interpretation   None       MDM   Final diagnoses:  Abdominal pain  Vomiting  Bowel obstruction    Pt with significant medical history presenting with LUQ abdominal pain. Hx of  pancreas and renal transplant 07/2013. Primary and  specialty physicians at [facility]. Went to [facility] ED two days ago, told symptoms from gastroparesis. Pt appears uncomfortable. Afebrile, VSS. Labs showing leukopenia of 1.7 (pt on immunosuppressants), otherwise no acute findings. Records from 10/03/13 and ED visit 2 days ago at [facility] reviewed as stated in HPI. Plan to obtain CT abd/pelvis. Case discussed with attending Dr. Jeneen Rinks who agrees with plan of care.  CT results showing mildly distended loops of small bowel with fluid levels in the central abdomen, possibly representing an early bowel obstruction or enteritis. Given patient's history of multiple surgeries, she may have a possible early bowel obstruction. Patient is well known before that this [facility], I spoke with general surgeon on-call Dr. Rinaldo Cloud, who suggests sending patient through ED. I spoke with ED attending Dr. Christianne Dolin who is expecting patient. Pt comfortable and stable for discharge.    Illene Labrador, PA-C 10/15/13 2136

## 2013-10-14 NOTE — ED Notes (Signed)
Pt. arrived with EMS from home reports LUQ pain with emesis ( x2) this evening , pt. stated history of gastroparesis . No diarrhea / denies fever or chills.

## 2013-10-14 NOTE — ED Notes (Signed)
Pt instructed to not drink contrast until her Nausea/Vomitting is controlled.

## 2013-10-14 NOTE — ED Notes (Addendum)
Pt called this RN into room r/t c/o increased nausea. Pt had episode of emesis with 715mL emesis. Pt had finished first cup of PO CT contrast prior to emesis. [place] PA notified

## 2013-10-15 ENCOUNTER — Emergency Department (HOSPITAL_COMMUNITY): Payer: Medicare Other

## 2013-10-15 ENCOUNTER — Encounter (HOSPITAL_COMMUNITY): Payer: Self-pay | Admitting: Radiology

## 2013-10-15 MED ORDER — HYDROMORPHONE HCL PF 1 MG/ML IJ SOLN
INTRAMUSCULAR | Status: AC
  Filled 2013-10-15: qty 1

## 2013-10-17 ENCOUNTER — Encounter (HOSPITAL_COMMUNITY): Payer: Self-pay | Admitting: Emergency Medicine

## 2013-10-17 ENCOUNTER — Emergency Department (HOSPITAL_COMMUNITY): Payer: Medicare Other

## 2013-10-17 ENCOUNTER — Emergency Department (HOSPITAL_COMMUNITY)
Admission: EM | Admit: 2013-10-17 | Discharge: 2013-10-18 | Disposition: A | Discharge status: Home / Self Care | Payer: Medicare Other | Attending: Emergency Medicine | Admitting: Emergency Medicine

## 2013-10-17 DIAGNOSIS — Z8739 Personal history of other diseases of the musculoskeletal system and connective tissue: Secondary | ICD-10-CM | POA: Insufficient documentation

## 2013-10-17 DIAGNOSIS — Z87891 Personal history of nicotine dependence: Secondary | ICD-10-CM | POA: Insufficient documentation

## 2013-10-17 DIAGNOSIS — Z87448 Personal history of other diseases of urinary system: Secondary | ICD-10-CM | POA: Insufficient documentation

## 2013-10-17 DIAGNOSIS — I10 Essential (primary) hypertension: Secondary | ICD-10-CM | POA: Insufficient documentation

## 2013-10-17 DIAGNOSIS — Z3202 Encounter for pregnancy test, result negative: Secondary | ICD-10-CM | POA: Insufficient documentation

## 2013-10-17 DIAGNOSIS — Z7982 Long term (current) use of aspirin: Secondary | ICD-10-CM | POA: Insufficient documentation

## 2013-10-17 DIAGNOSIS — Z79899 Other long term (current) drug therapy: Secondary | ICD-10-CM | POA: Insufficient documentation

## 2013-10-17 DIAGNOSIS — K3184 Gastroparesis: Secondary | ICD-10-CM | POA: Insufficient documentation

## 2013-10-17 DIAGNOSIS — E1065 Type 1 diabetes mellitus with hyperglycemia: Secondary | ICD-10-CM | POA: Insufficient documentation

## 2013-10-17 DIAGNOSIS — IMO0002 Reserved for concepts with insufficient information to code with codable children: Secondary | ICD-10-CM | POA: Insufficient documentation

## 2013-10-17 DIAGNOSIS — H547 Unspecified visual loss: Secondary | ICD-10-CM | POA: Insufficient documentation

## 2013-10-17 DIAGNOSIS — Z940 Kidney transplant status: Secondary | ICD-10-CM | POA: Insufficient documentation

## 2013-10-17 LAB — URINE MICROSCOPIC-ADD ON

## 2013-10-17 LAB — CBC WITH DIFFERENTIAL/PLATELET
BASOS ABS: 0 10*3/uL (ref 0.0–0.1)
BASOS PCT: 1 % (ref 0–1)
Eosinophils Absolute: 0.1 10*3/uL (ref 0.0–0.7)
Eosinophils Relative: 10 % — ABNORMAL HIGH (ref 0–5)
HEMATOCRIT: 36.6 % (ref 36.0–46.0)
HEMOGLOBIN: 12.3 g/dL (ref 12.0–15.0)
LYMPHS ABS: 0.2 10*3/uL — AB (ref 0.7–4.0)
LYMPHS PCT: 18 % (ref 12–46)
MCH: 30.4 pg (ref 26.0–34.0)
MCHC: 33.6 g/dL (ref 30.0–36.0)
MCV: 90.4 fL (ref 78.0–100.0)
MONOS PCT: 15 % — AB (ref 3–12)
Monocytes Absolute: 0.2 10*3/uL (ref 0.1–1.0)
Neutro Abs: 0.8 10*3/uL — ABNORMAL LOW (ref 1.7–7.7)
Neutrophils Relative %: 56 % (ref 43–77)
Platelets: 193 10*3/uL (ref 150–400)
RBC: 4.05 MIL/uL (ref 3.87–5.11)
RDW: 15.8 % — ABNORMAL HIGH (ref 11.5–15.5)
WBC: 1.3 10*3/uL — CL (ref 4.0–10.5)

## 2013-10-17 LAB — URINALYSIS, ROUTINE W REFLEX MICROSCOPIC
Bilirubin Urine: NEGATIVE
Glucose, UA: NEGATIVE mg/dL
KETONES UR: NEGATIVE mg/dL
LEUKOCYTES UA: NEGATIVE
NITRITE: NEGATIVE
Protein, ur: NEGATIVE mg/dL
Specific Gravity, Urine: 1.017 (ref 1.005–1.030)
UROBILINOGEN UA: 0.2 mg/dL (ref 0.0–1.0)
pH: 6 (ref 5.0–8.0)

## 2013-10-17 LAB — LIPASE, BLOOD: LIPASE: 25 U/L (ref 11–59)

## 2013-10-17 LAB — COMPREHENSIVE METABOLIC PANEL
ALT: 15 U/L (ref 0–35)
AST: 16 U/L (ref 0–37)
Albumin: 4 g/dL (ref 3.5–5.2)
Alkaline Phosphatase: 111 U/L (ref 39–117)
BUN: 28 mg/dL — AB (ref 6–23)
CHLORIDE: 102 meq/L (ref 96–112)
CO2: 22 mEq/L (ref 19–32)
CREATININE: 1.16 mg/dL — AB (ref 0.50–1.10)
Calcium: 10.1 mg/dL (ref 8.4–10.5)
GFR calc Af Amer: 72 mL/min — ABNORMAL LOW (ref >90)
GFR calc non Af Amer: 62 mL/min — ABNORMAL LOW (ref >90)
GLUCOSE: 86 mg/dL (ref 70–99)
Potassium: 4.8 mEq/L (ref 3.7–5.3)
Sodium: 138 mEq/L (ref 137–147)
Total Bilirubin: 0.3 mg/dL (ref 0.3–1.2)
Total Protein: 8.7 g/dL — ABNORMAL HIGH (ref 6.0–8.3)

## 2013-10-17 LAB — POCT PREGNANCY, URINE: Preg Test, Ur: NEGATIVE

## 2013-10-17 IMAGING — CR DG CHEST 2V
2 series · 2 of 2 positions shown · non-contrast
Comparison: CT ABD/PELV WO CM dated [DATE]; DG CHEST 2 VIEW
dated [DATE]

CLINICAL DATA: Shortness of breath, history of multiple organ
transplantation.

EXAM:
CHEST  2 VIEW

[w chest pa]
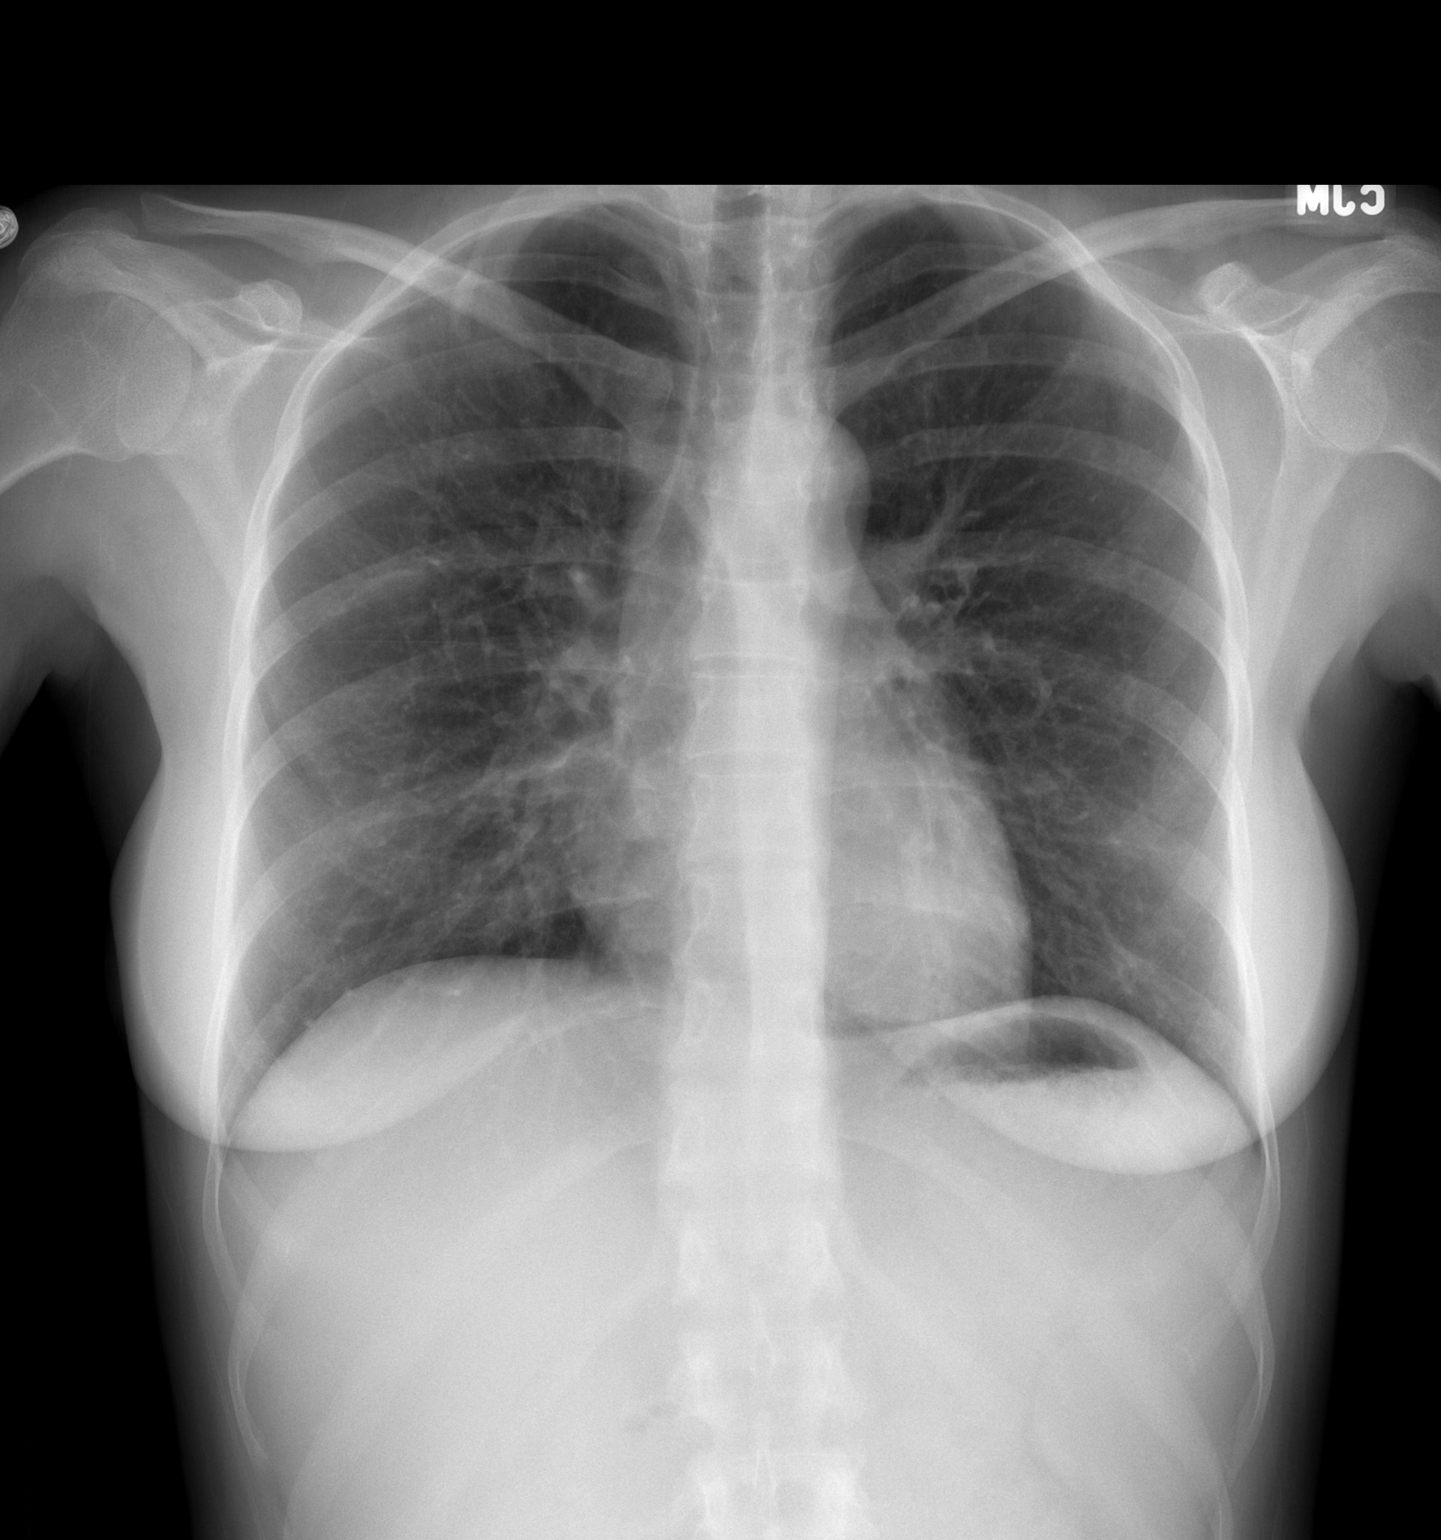

[w chest lat]
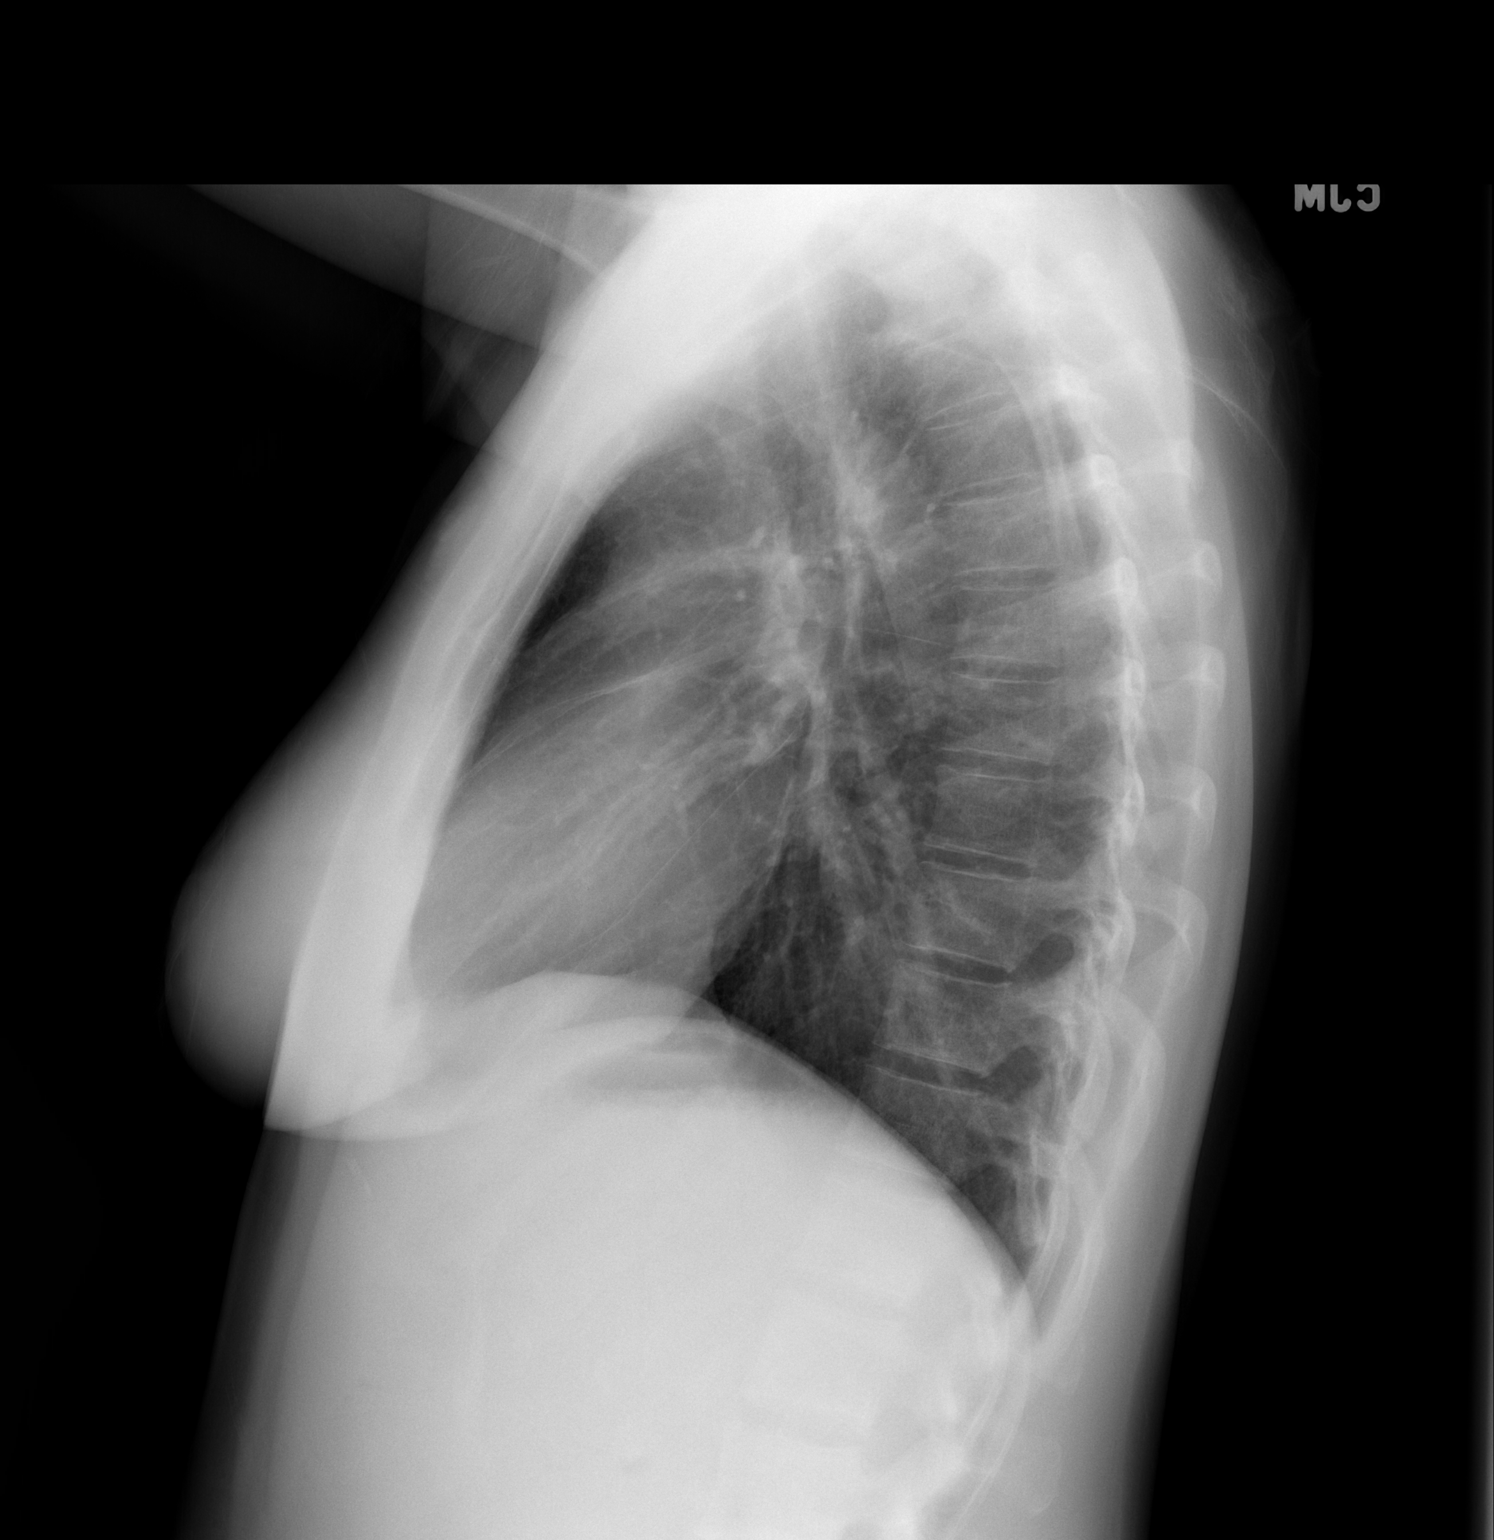

[2 of 2 positions shown; findings below may reference images not displayed]

FINDINGS: Grossly unchanged cardiac silhouette and mediastinal contours. The
lungs appear mildly hyperexpanded with mild diffuse slightly nodular
thickening of the pulmonary interstitium. No focal airspace
opacities. No pleural effusion or pneumothorax. No evidence of
edema. No acute osseus abnormalities.
IMPRESSION: Mild lung hyperexpansion and bronchitic change without acute
cardiopulmonary disease.

## 2013-10-17 MED ORDER — METOCLOPRAMIDE HCL 5 MG/ML IJ SOLN
10.0000 mg | Freq: Once | INTRAMUSCULAR | Status: AC
  Administered 2013-10-17: 10 mg via INTRAVENOUS
  Filled 2013-10-17: qty 2

## 2013-10-17 MED ORDER — HYDROMORPHONE HCL PF 1 MG/ML IJ SOLN
0.5000 mg | Freq: Once | INTRAMUSCULAR | Status: AC
  Administered 2013-10-17: 0.5 mg via INTRAVENOUS
  Filled 2013-10-17: qty 1

## 2013-10-17 MED ORDER — PROMETHAZINE HCL 25 MG/ML IJ SOLN
25.0000 mg | Freq: Once | INTRAMUSCULAR | Status: AC
  Administered 2013-10-17: 25 mg via INTRAVENOUS
  Filled 2013-10-17: qty 1

## 2013-10-17 MED ORDER — MORPHINE SULFATE 4 MG/ML IJ SOLN
4.0000 mg | Freq: Once | INTRAMUSCULAR | Status: AC
Start: 2013-10-17 — End: 2013-10-17
  Administered 2013-10-17: 4 mg via INTRAVENOUS
  Filled 2013-10-17: qty 1

## 2013-10-17 MED ORDER — HYDROMORPHONE HCL PF 1 MG/ML IJ SOLN
1.0000 mg | Freq: Once | INTRAMUSCULAR | Status: AC
  Administered 2013-10-17: 1 mg via INTRAVENOUS
  Filled 2013-10-17: qty 1

## 2013-10-17 NOTE — ED Notes (Signed)
Pt now states she has vomited x3 today

## 2013-10-17 NOTE — Discharge Instructions (Signed)
Gastroparesis  Gastroparesis is also called slowed stomach emptying (delayed gastric emptying). It is a condition in which the stomach takes too long to empty its contents. It often happens in people with diabetes.  CAUSES  Gastroparesis happens when nerves to the stomach are damaged or stop working. When the nerves are damaged, the muscles of the stomach and intestines do not work normally. The movement of food is slowed or stopped. High blood glucose (sugar) causes changes in nerves and can damage the blood vessels that carry oxygen and nutrients to the nerves. RISK FACTORS  Diabetes.  Post-viral syndromes.  Eating disorders (anorexia, bulimia).  Surgery on the stomach or vagus nerve.  Gastroesophageal reflux disease (rarely).  Smooth muscle disorders (amyloidosis, scleroderma).  Metabolic disorders, including hypothyroidism.  Parkinson disease. SYMPTOMS   Heartburn.  Feeling sick to your stomach (nausea).  Vomiting of undigested food.  An early feeling of fullness when eating.  Weight loss.  Abdominal bloating.  Erratic blood glucose levels.  Lack of appetite.  Gastroesophageal reflux.  Spasms of the stomach wall. Complications can include:  Bacterial overgrowth in stomach. Food stays in the stomach and can ferment and cause bacteria to grow.  Weight loss due to difficulty digesting and absorbing nutrients.  Vomiting.  Obstruction in the stomach. Undigested food can harden and cause nausea and vomiting.  Blood glucose fluctuations caused by inconsistent food absorption. DIAGNOSIS  The diagnosis of gastroparesis is confirmed through one or more of the following tests:  Barium X-rays and scans. These tests look at how long it takes for food to move through the stomach.  Gastric manometry. This test measures electrical and muscular activity in the stomach. A thin tube is passed down the throat into the stomach. The tube contains a wire that takes measurements  of the stomach's electrical and muscular activity as it digests liquids and solid food.  Endoscopy. This procedure is done with a long, thin tube called an endoscope. It is passed through the mouth and gently down the esophagus into the stomach. This tube helps the caregiver look at the lining of the stomach to check for any abnormalities.  Ultrasonography. This can rule out gallbladder disease or pancreatitis. This test will outline and define the shape of the gallbladder and pancreas. TREATMENT   Treatments may include:  Exercise.  Medicines to control nausea and vomiting.  Medicines to stimulate stomach muscles.  Changes in what and when you eat.  Having smaller meals more often.  Eating low-fiber forms of high-fiber foods, such as eating cooked vegetables instead of raw vegetables.  Eating low-fat foods.  Consuming liquids, which are easier to digest.  In severe cases, feeding tubes and intravenous (IV) feeding may be needed. It is important to note that in most cases, treatment does not cure gastroparesis. It is usually a lasting (chronic) condition. Treatment helps you manage the underlying condition so that you can be as healthy and comfortable as possible. Other treatments  A gastric neurostimulator has been developed to assist people with gastroparesis. The battery-operated device is surgically implanted. It emits mild electrical pulses to help improve stomach emptying and to control nausea and vomiting.  The use of botulinum toxin has been shown to improve stomach emptying by decreasing the prolonged contractions of the muscle between the stomach and the small intestine (pyloric sphincter). The benefits are temporary. SEEK MEDICAL CARE IF:   You have diabetes and you are having problems keeping your blood glucose in goal range.  You are having nausea,   vomiting, bloating, or early feelings of fullness with eating.  Your symptoms do not change with a change in  diet. Document Released: 08/21/2005 Document Revised: 12/16/2012 Document Reviewed: 01/28/2009 [employer] Patient Information 2014 [employer].  

## 2013-10-17 NOTE — ED Notes (Addendum)
Patient arrived via [facility] from home with right sided abdominal pain. Patient is a pancreatic/kidney transplant patient that is seen at [facility]. She was seen by the transplant team today and informed the patient it was gastroporesis. She has N/V. Patient is A/O. No meds given by EMS. EMS states that the home was covered with alcohol.

## 2013-10-17 NOTE — ED Provider Notes (Signed)
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 She's had similar symptoms for the past 5 weeks and was seen here in the emergency department. She had a CT scan which showed a possible early small bowel obstruction and was transferred to [place]. There she was diagnosed with gastroparesis and discharged home. She states tonight she had a fever of 101 at home but did not take any antipyretics. Here the emergency department she is afebrile. She denies any diarrhea. She is having good bowel movements and passing gas. She states earlier today she did have shortness of breath this is now gone. No chest pain. No history of PE or DVT. No cough.  ROS: See HPI Constitutional: no fever  Eyes: no drainage  ENT: no runny nose   Cardiovascular:  no chest pain  Resp: no SOB  GI: vomiting GU: no dysuria Integumentary: no rash  Allergy: no hives  Musculoskeletal: no leg swelling  Neurological: no slurred speech ROS otherwise negative  PAST MEDICAL HISTORY/PAST SURGICAL HISTORY:  Past Medical History  Diagnosis Date  . DIABETES MELLITUS, TYPE I, UNCONTROLLED 11/05/2007  . MEDIAL EPICONDYLITIS, LEFT 12/17/2007  . Vision loss   . Renal disorder   . Diabetes mellitus   . Hypertension     MEDICATIONS:  Prior to Admission medications   Medication Sig Start Date End Date Taking? Authorizing Provider  aspirin 81 MG tablet Take 81 mg by mouth daily.   Yes Historical Provider, MD  cycloSPORINE (SANDIMMUNE) 100 MG capsule Take 200 mg by mouth 2 (two) times daily.    Yes Historical Provider, MD  cycloSPORINE (SANDIMMUNE) 25 MG capsule Take 25 mg by mouth 3 (three) times daily.   Yes Historical Provider, MD  docusate sodium (COLACE)  100 MG capsule Take 100 mg by mouth 2 (two) times daily.   Yes Historical Provider, MD  gabapentin (NEURONTIN) 100 MG capsule Take 100 mg by mouth 3 (three) times daily.   Yes Historical Provider, MD  metoCLOPramide (REGLAN) 5 MG tablet Take 5 mg by mouth See admin instructions. Take 2 tablet by mouth three times daily for 7 days. Started medication on 10-13-13   Yes Historical Provider, MD  mycophenolate (MYFORTIC) 180 MG EC tablet Take 540 mg by mouth 3 (three) times daily.    Yes Historical Provider, MD  omeprazole (PRILOSEC) 20 MG capsule Take 20 mg by mouth daily as needed. For upset stomach   Yes Historical Provider, MD  polyethylene glycol (MIRALAX / GLYCOLAX) packet Take 17 g by mouth daily as needed for mild constipation.   Yes Historical Provider, MD  traMADol (ULTRAM) 50 MG tablet Take 50 mg by mouth every 6 (six) hours as needed for moderate pain. Take for 10 days. Started medication on finished medication today 10-14-13   Yes Historical Provider, MD  valGANciclovir (VALCYTE) 450 MG tablet Take 450 mg by mouth daily.   Yes Historical Provider, MD    ALLERGIES:  Allergies  Allergen Reactions  . Shrimp [Shellfish Allergy] Rash    SOCIAL HISTORY:  History  Substance Use Topics  . Smoking status: Former Smoker -- 1 years    Types: Pipe  . Smokeless tobacco: Never Used     Comment: Hooka  . Alcohol Use: No    FAMILY  HISTORY: Family History  Problem Relation Age of Onset  . Hypertension Father     EXAM: BP 154/90  Pulse 88  Temp(Src) 97.6 F (36.4 C) (Oral)  Resp 18  SpO2 99%  LMP 10/14/2013 CONSTITUTIONAL: Alert and oriented and responds appropriately to questions. Well-appearing; well-nourished; tearful, appears uncomfortable HEAD: Normocephalic EYES: Conjunctivae clear, PERRL ENT: normal nose; no rhinorrhea; moist mucous membranes; pharynx without lesions noted NECK: Supple, no meningismus, no LAD  CARD: RRR; S1 and S2 appreciated; no murmurs, no clicks, no rubs,  no gallops RESP: Normal chest excursion without splinting or tachypnea; breath sounds clear and equal bilaterally; no wheezes, no rhonchi, no rales,  ABD/GI: Normal bowel sounds; non-distended; soft, non-tender, no rebound, no guarding BACK:  The back appears normal and is non-tender to palpation, there is no CVA tenderness EXT: Normal ROM in all joints; non-tender to palpation; no edema; normal capillary refill; no cyanosis    SKIN: Normal color for age and race; warm NEURO: Moves all extremities equally PSYCH: The patient's mood and manner are appropriate. Grooming and personal hygiene are appropriate.  MEDICAL DECISION MAKING: Patient here with 5 weeks of left upper quadrant pain. She is seen by her gastrointestinal wake [facility] who thought this was due to gastroparesis. Her abdominal exam is completely benign when she is distracted. Will check labs, urine. We'll also check chest x-ray given patient is reporting shortness of breath that is now resolved and she is on immunosuppressants. We'll give pain in nausea medication and reassess.  ED PROGRESS: Patient reports feeling much better after Dilaudid and Phenergan. Her labs show leukopenia she is not neutropenic. She is afebrile. She is very mild elevation of her creatinine at 1.16. She is receiving IV fluids. Urine shows large hemoglobin, patient is menstruating. No other sign of infection. Likely symptoms due to her gastroparesis. She feels comfortable with plan for discharge home. Given strict return precautions.     [place], DO 10/17/13 2300

## 2013-10-17 NOTE — ED Notes (Signed)
Attempted to draw blood 2x and was unsuccessful

## 2013-10-17 NOTE — ED Notes (Signed)
Pt reports LUQ pain x4-5 weeks, seen here 2 days ago for the same, pt states we transported her to [facility] where she followed up with her Gastroenterologist; Dr. Wendall Papa. They discharged her home with a diagnosis of a flare up of her gastroparesis. Pt denies nausea and vomiting.

## 2013-10-18 ENCOUNTER — Emergency Department (HOSPITAL_COMMUNITY)
Admission: EM | Admit: 2013-10-18 | Discharge: 2013-10-19 | Disposition: A | Discharge status: Home / Self Care | Payer: Medicare Other | Attending: Emergency Medicine | Admitting: Emergency Medicine

## 2013-10-18 DIAGNOSIS — Z940 Kidney transplant status: Secondary | ICD-10-CM | POA: Insufficient documentation

## 2013-10-18 DIAGNOSIS — R1011 Right upper quadrant pain: Secondary | ICD-10-CM | POA: Insufficient documentation

## 2013-10-18 DIAGNOSIS — I10 Essential (primary) hypertension: Secondary | ICD-10-CM | POA: Insufficient documentation

## 2013-10-18 DIAGNOSIS — R109 Unspecified abdominal pain: Secondary | ICD-10-CM

## 2013-10-18 DIAGNOSIS — Z79899 Other long term (current) drug therapy: Secondary | ICD-10-CM | POA: Insufficient documentation

## 2013-10-18 DIAGNOSIS — Z87891 Personal history of nicotine dependence: Secondary | ICD-10-CM | POA: Insufficient documentation

## 2013-10-18 DIAGNOSIS — R1012 Left upper quadrant pain: Secondary | ICD-10-CM | POA: Insufficient documentation

## 2013-10-18 DIAGNOSIS — Z9483 Pancreas transplant status: Secondary | ICD-10-CM | POA: Insufficient documentation

## 2013-10-18 DIAGNOSIS — F19939 Other psychoactive substance use, unspecified with withdrawal, unspecified: Secondary | ICD-10-CM

## 2013-10-18 DIAGNOSIS — IMO0002 Reserved for concepts with insufficient information to code with codable children: Secondary | ICD-10-CM | POA: Insufficient documentation

## 2013-10-18 DIAGNOSIS — Z8739 Personal history of other diseases of the musculoskeletal system and connective tissue: Secondary | ICD-10-CM | POA: Insufficient documentation

## 2013-10-18 DIAGNOSIS — R1013 Epigastric pain: Secondary | ICD-10-CM | POA: Insufficient documentation

## 2013-10-18 DIAGNOSIS — E1065 Type 1 diabetes mellitus with hyperglycemia: Secondary | ICD-10-CM | POA: Insufficient documentation

## 2013-10-18 DIAGNOSIS — Z7982 Long term (current) use of aspirin: Secondary | ICD-10-CM | POA: Insufficient documentation

## 2013-10-18 DIAGNOSIS — R110 Nausea: Secondary | ICD-10-CM | POA: Insufficient documentation

## 2013-10-18 DIAGNOSIS — N289 Disorder of kidney and ureter, unspecified: Secondary | ICD-10-CM | POA: Insufficient documentation

## 2013-10-18 LAB — COMPREHENSIVE METABOLIC PANEL
ALK PHOS: 90 U/L (ref 39–117)
ALT: 13 U/L (ref 0–35)
AST: 16 U/L (ref 0–37)
Albumin: 3.4 g/dL — ABNORMAL LOW (ref 3.5–5.2)
BILIRUBIN TOTAL: 0.3 mg/dL (ref 0.3–1.2)
BUN: 30 mg/dL — ABNORMAL HIGH (ref 6–23)
CO2: 23 meq/L (ref 19–32)
Calcium: 9.5 mg/dL (ref 8.4–10.5)
Chloride: 105 mEq/L (ref 96–112)
Creatinine, Ser: 1.03 mg/dL (ref 0.50–1.10)
GFR calc non Af Amer: 72 mL/min — ABNORMAL LOW (ref >90)
GFR, EST AFRICAN AMERICAN: 83 mL/min — AB (ref >90)
GLUCOSE: 94 mg/dL (ref 70–99)
POTASSIUM: 4.9 meq/L (ref 3.7–5.3)
Sodium: 140 mEq/L (ref 137–147)
TOTAL PROTEIN: 7.5 g/dL (ref 6.0–8.3)

## 2013-10-18 LAB — URINALYSIS, ROUTINE W REFLEX MICROSCOPIC
BILIRUBIN URINE: NEGATIVE
Glucose, UA: NEGATIVE mg/dL
Ketones, ur: NEGATIVE mg/dL
Leukocytes, UA: NEGATIVE
NITRITE: NEGATIVE
PROTEIN: NEGATIVE mg/dL
Specific Gravity, Urine: 1.015 (ref 1.005–1.030)
UROBILINOGEN UA: 1 mg/dL (ref 0.0–1.0)
pH: 6.5 (ref 5.0–8.0)

## 2013-10-18 LAB — CBC WITH DIFFERENTIAL/PLATELET
BASOS ABS: 0 10*3/uL (ref 0.0–0.1)
Basophils Relative: 0 % (ref 0–1)
Eosinophils Absolute: 0.1 10*3/uL (ref 0.0–0.7)
Eosinophils Relative: 11 % — ABNORMAL HIGH (ref 0–5)
HEMATOCRIT: 34.1 % — AB (ref 36.0–46.0)
Hemoglobin: 11.7 g/dL — ABNORMAL LOW (ref 12.0–15.0)
LYMPHS ABS: 0.2 10*3/uL — AB (ref 0.7–4.0)
Lymphocytes Relative: 15 % (ref 12–46)
MCH: 30.8 pg (ref 26.0–34.0)
MCHC: 34.3 g/dL (ref 30.0–36.0)
MCV: 89.7 fL (ref 78.0–100.0)
Monocytes Absolute: 0.2 10*3/uL (ref 0.1–1.0)
Monocytes Relative: 20 % — ABNORMAL HIGH (ref 3–12)
NEUTROS ABS: 0.7 10*3/uL — AB (ref 1.7–7.7)
Neutrophils Relative %: 54 % (ref 43–77)
PLATELETS: 179 10*3/uL (ref 150–400)
RBC: 3.8 MIL/uL — ABNORMAL LOW (ref 3.87–5.11)
RDW: 15.4 % (ref 11.5–15.5)
WBC: 1.2 10*3/uL — CL (ref 4.0–10.5)

## 2013-10-18 LAB — URINE MICROSCOPIC-ADD ON

## 2013-10-18 LAB — LIPASE, BLOOD: Lipase: 32 U/L (ref 11–59)

## 2013-10-18 MED ORDER — HYDROMORPHONE HCL PF 1 MG/ML IJ SOLN
1.0000 mg | Freq: Once | INTRAMUSCULAR | Status: AC
  Administered 2013-10-18: 1 mg via INTRAVENOUS
  Filled 2013-10-18: qty 1

## 2013-10-18 NOTE — ED Notes (Signed)
Per EMS: Patient coming from home with epigastric pain. Seen here yesterday for the same. Rates pain 10/10. Pt has history of pancreas transplant and kidney transplant. Vitals WNL during transport. Pt vomited x1. NAD at this time. Patient having her menstrual cycle now.

## 2013-10-18 NOTE — ED Provider Notes (Signed)
CSN: [id number]     Arrival date & time 10/18/13  2035 History   First MD Initiated Contact with Patient 10/18/13 2241     Chief Complaint  Patient presents with  . Abdominal Pain     (Consider location/radiation/quality/duration/timing/severity/associated sxs/prior Treatment) HPI Comments: Patient presents to the emergency department for recurrent epigastric left upper quadrant and right upper quadrant pain.  She states she is a renal pancreas transplant transplanted pancreas, and transplant, kidney, or in the lower abdomen .  She denies vomiting, but endorses nausea , no diarrhea , or fever .  She was seen here yesterday for same after receiving IV Dilaudid .  Patient states she was feeling much better. Review of patient's clinic notes reveals that the informed her on Friday, that they would no longer be prescribing tramadol or narcotic for her pain.  That she most likely had gastroparesis and was prescribed, Reglan, which he, states she's been taking on a regular basis.  Cyclosporine levels were drawn at that time.  Patient is a 33 y.o. female presenting with abdominal pain. The history is provided by the patient.  Abdominal Pain Pain location:  Epigastric, LUQ and RUQ Pain quality: aching and cramping   Pain radiates to:  Does not radiate Pain severity:  Severe Onset quality:  Unable to specify Timing:  Constant Progression:  Worsening Chronicity:  New Context: medication withdrawal and previous surgery   Context: not alcohol use, not awakening from sleep, not diet changes, not eating, not laxative use, not retching, not sick contacts, not suspicious food intake and not trauma   Context comment:  Recently seen at her transplant clinic, who stated they will not prescribe narcotic or tramadol for her.  Any longer, so, perhaps this is withdrawal, pain Relieved by:  Nothing Worsened by:  Movement and palpation Ineffective treatments:  None tried Associated symptoms: nausea   Associated  symptoms: no chest pain, no constipation, no diarrhea, no dysuria, no fever, no flatus, no shortness of breath and no vomiting   Nausea:    Severity:  Severe   Onset quality:  Unable to specify   Duration:  1 day   Timing:  Constant   Progression:  Worsening Risk factors: no alcohol abuse   Risk factors comment:  Renal pancreas transplant   Past Medical History  Diagnosis Date  . DIABETES MELLITUS, TYPE I, UNCONTROLLED 11/05/2007  . MEDIAL EPICONDYLITIS, LEFT 12/17/2007  . Vision loss   . Renal disorder   . Diabetes mellitus   . Hypertension    Past Surgical History  Procedure Laterality Date  . Refractive surgery    . Eye surgery    . Av fistula placement  11/17/2011    Procedure: ARTERIOVENOUS (AV) FISTULA CREATION;  Surgeon: Rosetta Posner, MD;  Location: [facility];  Service: Vascular;  Laterality: Right;  . Kidney transplant    . Pancrease transplant     Family History  Problem Relation Age of Onset  . Hypertension Father    History  Substance Use Topics  . Smoking status: Former Smoker -- 1 years    Types: Pipe  . Smokeless tobacco: Never Used     Comment: Hooka  . Alcohol Use: No   OB History   Grav Para Term Preterm Abortions TAB SAB Ect Mult Living                 Review of Systems  Constitutional: Negative for fever.  Respiratory: Negative for shortness of breath.   Cardiovascular:  Negative for chest pain.  Gastrointestinal: Positive for nausea and abdominal pain. Negative for vomiting, diarrhea, constipation and flatus.  Genitourinary: Negative for dysuria and flank pain.  Skin: Negative for rash and wound.  All other systems reviewed and are negative.      Allergies  Shrimp  Home Medications   Current Outpatient Rx  Name  Route  Sig  Dispense  Refill  . aspirin 81 MG tablet   Oral   Take 81 mg by mouth daily.         . cycloSPORINE (SANDIMMUNE) 100 MG capsule   Oral   Take 200 mg by mouth 2 (two) times daily.          . cycloSPORINE  (SANDIMMUNE) 25 MG capsule   Oral   Take 25 mg by mouth 3 (three) times daily.         Marland Kitchen docusate sodium (COLACE) 100 MG capsule   Oral   Take 100 mg by mouth 2 (two) times daily.         Marland Kitchen gabapentin (NEURONTIN) 100 MG capsule   Oral   Take 100 mg by mouth 3 (three) times daily.         . metoCLOPramide (REGLAN) 5 MG tablet   Oral   Take 5 mg by mouth See admin instructions. Take 2 tablet by mouth three times daily for 7 days. Started medication on 10-13-13         . mycophenolate (MYFORTIC) 180 MG EC tablet   Oral   Take 540 mg by mouth 3 (three) times daily.          Marland Kitchen omeprazole (PRILOSEC) 20 MG capsule   Oral   Take 20 mg by mouth daily as needed. For upset stomach         . polyethylene glycol (MIRALAX / GLYCOLAX) packet   Oral   Take 17 g by mouth daily as needed for mild constipation.         . traMADol (ULTRAM) 50 MG tablet   Oral   Take 50 mg by mouth every 6 (six) hours as needed for moderate pain. Take for 10 days. Started medication on finished medication today 10-14-13         . valGANciclovir (VALCYTE) 450 MG tablet   Oral   Take 450 mg by mouth daily.          BP 162/93  Pulse 88  Temp(Src) 98.3 F (36.8 C) (Oral)  Resp 18  SpO2 100%  LMP 10/14/2013 Physical Exam  Nursing note and vitals reviewed. Constitutional: She is oriented to person, place, and time. She appears well-developed and well-nourished.  HENT:  Head: Normocephalic.  Eyes: Pupils are equal, round, and reactive to light.  Neck: Normal range of motion.  Cardiovascular: Normal rate and regular rhythm.   Pulmonary/Chest: Effort normal and breath sounds normal.  Abdominal: Soft. Normal appearance. She exhibits no distension, no pulsatile liver, no abdominal bruit and no mass. Bowel sounds are decreased. There is no hepatosplenomegaly. There is tenderness in the right upper quadrant, epigastric area and left upper quadrant. There is no rebound and no guarding.   Musculoskeletal: Normal range of motion. She exhibits no edema and no tenderness.  Neurological: She is alert and oriented to person, place, and time.    ED Course  Procedures (including critical care time) Labs Review Labs Reviewed  COMPREHENSIVE METABOLIC PANEL - Abnormal; Notable for the following:    BUN 30 (*)    Albumin 3.4 (*)  GFR calc non Af Amer 72 (*)    GFR calc Af Amer 83 (*)    All other components within normal limits  CBC WITH DIFFERENTIAL - Abnormal; Notable for the following:    WBC 1.2 (*)    RBC 3.80 (*)    Hemoglobin 11.7 (*)    HCT 34.1 (*)    Monocytes Relative 20 (*)    Eosinophils Relative 11 (*)    Neutro Abs 0.7 (*)    Lymphs Abs 0.2 (*)    All other components within normal limits  URINALYSIS, ROUTINE W REFLEX MICROSCOPIC - Abnormal; Notable for the following:    Hgb urine dipstick MODERATE (*)    All other components within normal limits  URINE MICROSCOPIC-ADD ON - Abnormal; Notable for the following:    Squamous Epithelial / LPF FEW (*)    Bacteria, UA FEW (*)    All other components within normal limits  LIPASE, BLOOD   Imaging Review Dg Chest 2 View  10/17/2013   CLINICAL DATA:  Shortness of breath, history of multiple organ transplantation.  EXAM: CHEST  2 VIEW  COMPARISON:  CT ABD/PELV WO CM dated 10/14/2013; DG CHEST 2 VIEW dated 10/04/2012  FINDINGS: Grossly unchanged cardiac silhouette and mediastinal contours. The lungs appear mildly hyperexpanded with mild diffuse slightly nodular thickening of the pulmonary interstitium. No focal airspace opacities. No pleural effusion or pneumothorax. No evidence of edema. No acute osseus abnormalities.  IMPRESSION: Mild lung hyperexpansion and bronchitic change without acute cardiopulmonary disease.   Electronically Signed   By: Sandi Mariscal M.D.   On: 10/17/2013 22:33    EKG Interpretation   None       MDM   Final diagnoses:  Abdominal cramping  Medication withdrawal     Had a long  discussion with the son for about her pain.  She reports, that she has not had any of her tramadol and give her 3, days.  She has a prescription, but has not filled it.  We discussed withdrawal symptoms, and she agrees that most of her symptoms, onset, etc. fit the pattern for narcotic withdrawal.  She understands this and has asked many questions on how to go about successfully getting off Tramadol. Patient will be given 1 Tramadol and 1 Zofran in the ED and fil her RX in the morning.  She is totally on board with this plan    Garald Balding, NP 10/19/13 [phone]

## 2013-10-18 NOTE — ED Notes (Signed)
Critical value; WBC 1.2, reported to Junius Creamer, NP.

## 2013-10-19 MED ORDER — TRAMADOL HCL 50 MG PO TABS
50.0000 mg | ORAL_TABLET | Freq: Once | ORAL | Status: AC
  Administered 2013-10-19: 50 mg via ORAL
  Filled 2013-10-19: qty 1

## 2013-10-19 MED ORDER — ONDANSETRON 4 MG PO TBDP
4.0000 mg | ORAL_TABLET | Freq: Once | ORAL | Status: AC
  Administered 2013-10-19: 4 mg via ORAL
  Filled 2013-10-19: qty 1

## 2013-10-19 NOTE — Discharge Instructions (Signed)
Abdominal Pain, Adult Many things can cause belly (abdominal) pain. Most times, the belly pain is not dangerous. Many cases of belly pain can be watched and treated at home. HOME CARE   Do not take medicines that help you go poop (laxatives) unless told to by your doctor.  Only take medicine as told by your doctor.  Eat or drink as told by your doctor. Your doctor will tell you if you should be on a special diet. GET HELP IF:  You do not know what is causing your belly pain.  You have belly pain while you are sick to your stomach (nauseous) or have runny poop (diarrhea).  You have pain while you pee or poop.  Your belly pain wakes you up at night.  You have belly pain that gets worse or better when you eat.  You have belly pain that gets worse when you eat fatty foods. GET HELP RIGHT AWAY IF:   The pain does not go away within 2 hours.  You have a fever.  You keep throwing up (vomiting).  The pain changes and is only in the right or left part of the belly.  You have bloody or tarry looking poop. MAKE SURE YOU:   Understand these instructions.  Will watch your condition.  Will get help right away if you are not doing well or get worse. Document Released: 02/07/2008 Document Revised: 06/11/2013 Document Reviewed: 04/30/2013 [facility] Patient Information 2014 [place]. As discussed the symptoms you are having are consistent with drug withdrawal  Please try to gradually cut down the number of Tramadol tablets you are using daily by increasing the time between tablets. If you can not do this on your own ask a friend or relative to monitor or administer your medication and gradually increase time between doses

## 2013-10-19 NOTE — ED Provider Notes (Signed)
Medical screening examination/treatment/procedure(s) were performed by non-physician practitioner and as supervising physician I was immediately available for consultation/collaboration.  EKG Interpretation   None         Blanchie Dessert, MD 10/19/13 1238

## 2013-10-20 LAB — PATHOLOGIST SMEAR REVIEW

## 2013-10-20 NOTE — ED Provider Notes (Signed)
Medical screening examination/treatment/procedure(s) were performed by non-physician practitioner and as supervising physician I was immediately available for consultation/collaboration.  EKG Interpretation   None         Tanna Furry, MD 10/20/13 2244

## 2013-10-21 ENCOUNTER — Emergency Department (HOSPITAL_COMMUNITY)
Admission: EM | Admit: 2013-10-21 | Discharge: 2013-10-22 | Disposition: A | Discharge status: Home / Self Care | Payer: Medicare Other | Attending: Emergency Medicine | Admitting: Emergency Medicine

## 2013-10-21 ENCOUNTER — Encounter (HOSPITAL_COMMUNITY): Payer: Self-pay | Admitting: Emergency Medicine

## 2013-10-21 DIAGNOSIS — Z8669 Personal history of other diseases of the nervous system and sense organs: Secondary | ICD-10-CM | POA: Insufficient documentation

## 2013-10-21 DIAGNOSIS — R112 Nausea with vomiting, unspecified: Secondary | ICD-10-CM | POA: Insufficient documentation

## 2013-10-21 DIAGNOSIS — R1012 Left upper quadrant pain: Secondary | ICD-10-CM | POA: Insufficient documentation

## 2013-10-21 DIAGNOSIS — Z940 Kidney transplant status: Secondary | ICD-10-CM | POA: Insufficient documentation

## 2013-10-21 DIAGNOSIS — K3184 Gastroparesis: Secondary | ICD-10-CM | POA: Insufficient documentation

## 2013-10-21 DIAGNOSIS — Z7982 Long term (current) use of aspirin: Secondary | ICD-10-CM | POA: Insufficient documentation

## 2013-10-21 DIAGNOSIS — E109 Type 1 diabetes mellitus without complications: Secondary | ICD-10-CM | POA: Insufficient documentation

## 2013-10-21 DIAGNOSIS — I10 Essential (primary) hypertension: Secondary | ICD-10-CM | POA: Insufficient documentation

## 2013-10-21 DIAGNOSIS — Z9483 Pancreas transplant status: Secondary | ICD-10-CM | POA: Insufficient documentation

## 2013-10-21 DIAGNOSIS — Z8739 Personal history of other diseases of the musculoskeletal system and connective tissue: Secondary | ICD-10-CM | POA: Insufficient documentation

## 2013-10-21 DIAGNOSIS — Z79899 Other long term (current) drug therapy: Secondary | ICD-10-CM | POA: Insufficient documentation

## 2013-10-21 DIAGNOSIS — R109 Unspecified abdominal pain: Secondary | ICD-10-CM

## 2013-10-21 DIAGNOSIS — R42 Dizziness and giddiness: Secondary | ICD-10-CM | POA: Insufficient documentation

## 2013-10-21 DIAGNOSIS — Z87891 Personal history of nicotine dependence: Secondary | ICD-10-CM | POA: Insufficient documentation

## 2013-10-21 DIAGNOSIS — G8929 Other chronic pain: Secondary | ICD-10-CM

## 2013-10-21 DIAGNOSIS — R197 Diarrhea, unspecified: Secondary | ICD-10-CM | POA: Insufficient documentation

## 2013-10-21 DIAGNOSIS — F411 Generalized anxiety disorder: Secondary | ICD-10-CM | POA: Insufficient documentation

## 2013-10-21 HISTORY — DX: Anxiety disorder, unspecified: F41.9

## 2013-10-21 NOTE — ED Notes (Signed)
EMS called to home.  Found patient sitting at home and ambulatory. Patient is alert and oriented x3.  She is complaining of abdominal pain.   She denied any medication for nausea or pain from EMS Currently she rates her pain 10 of 10.

## 2013-10-22 ENCOUNTER — Emergency Department (HOSPITAL_COMMUNITY): Payer: Medicare Other

## 2013-10-22 LAB — CBC WITH DIFFERENTIAL/PLATELET
Basophils Absolute: 0 10*3/uL (ref 0.0–0.1)
Basophils Relative: 1 % (ref 0–1)
Eosinophils Absolute: 0.1 10*3/uL (ref 0.0–0.7)
Eosinophils Relative: 5 % (ref 0–5)
HEMATOCRIT: 36 % (ref 36.0–46.0)
HEMOGLOBIN: 12.1 g/dL (ref 12.0–15.0)
LYMPHS PCT: 25 % (ref 12–46)
Lymphs Abs: 0.5 10*3/uL — ABNORMAL LOW (ref 0.7–4.0)
MCH: 30 pg (ref 26.0–34.0)
MCHC: 33.6 g/dL (ref 30.0–36.0)
MCV: 89.1 fL (ref 78.0–100.0)
MONOS PCT: 15 % — AB (ref 3–12)
Monocytes Absolute: 0.3 10*3/uL (ref 0.1–1.0)
NEUTROS ABS: 1 10*3/uL — AB (ref 1.7–7.7)
NEUTROS PCT: 54 % (ref 43–77)
Platelets: 188 10*3/uL (ref 150–400)
RBC: 4.04 MIL/uL (ref 3.87–5.11)
RDW: 15.1 % (ref 11.5–15.5)
WBC: 1.8 10*3/uL — ABNORMAL LOW (ref 4.0–10.5)

## 2013-10-22 LAB — COMPREHENSIVE METABOLIC PANEL
ALK PHOS: 103 U/L (ref 39–117)
ALT: 10 U/L (ref 0–35)
AST: 13 U/L (ref 0–37)
Albumin: 3.6 g/dL (ref 3.5–5.2)
BILIRUBIN TOTAL: 0.3 mg/dL (ref 0.3–1.2)
BUN: 37 mg/dL — AB (ref 6–23)
CHLORIDE: 103 meq/L (ref 96–112)
CO2: 22 meq/L (ref 19–32)
Calcium: 10.1 mg/dL (ref 8.4–10.5)
Creatinine, Ser: 1.17 mg/dL — ABNORMAL HIGH (ref 0.50–1.10)
GFR, EST AFRICAN AMERICAN: 71 mL/min — AB (ref >90)
GFR, EST NON AFRICAN AMERICAN: 61 mL/min — AB (ref >90)
GLUCOSE: 95 mg/dL (ref 70–99)
POTASSIUM: 5 meq/L (ref 3.7–5.3)
Sodium: 136 mEq/L — ABNORMAL LOW (ref 137–147)
Total Protein: 7.7 g/dL (ref 6.0–8.3)

## 2013-10-22 LAB — LIPASE, BLOOD: LIPASE: 25 U/L (ref 11–59)

## 2013-10-22 IMAGING — CR DG ABDOMEN ACUTE W/ 1V CHEST
3 series · 3 of 3 positions shown · non-contrast
Comparison: DG CHEST 2 VIEW dated [DATE]; CT ABD/PELV WO CM
dated [DATE]

CLINICAL DATA: Short of breath. History of renal transplant and
pancreatic transplant

EXAM:
ACUTE ABDOMEN SERIES (ABDOMEN 2 VIEW & CHEST 1 VIEW)

[w chest pa]
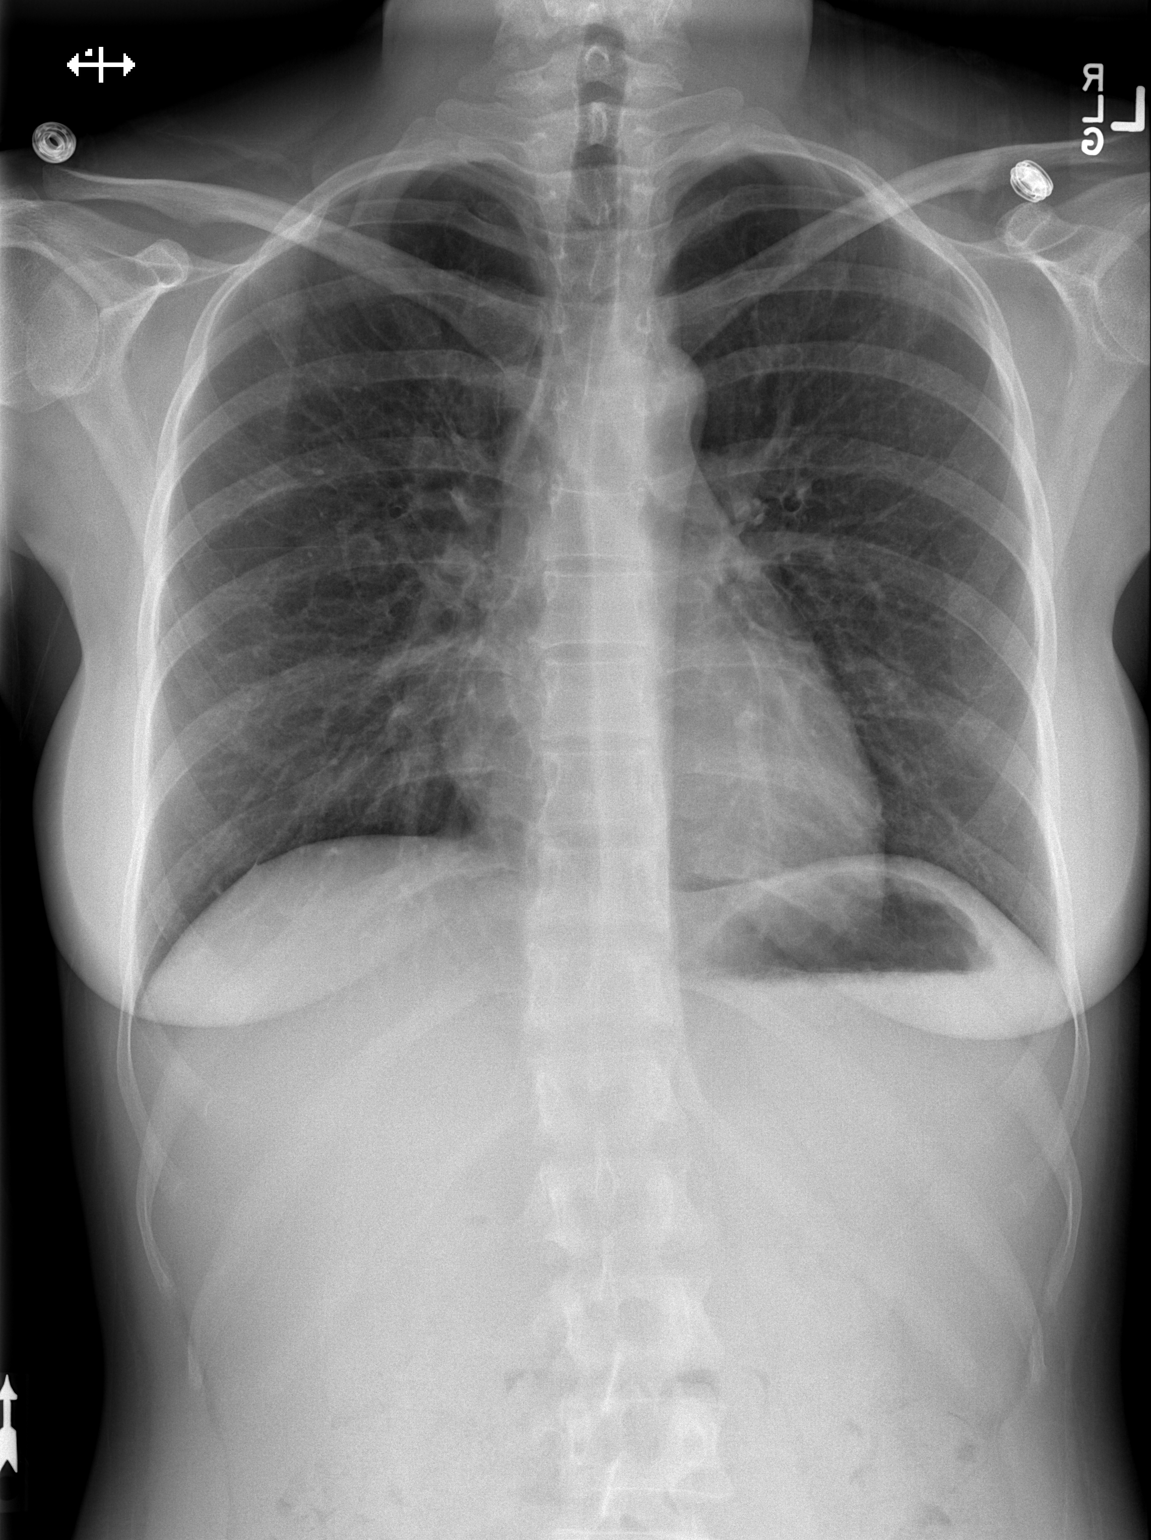

[w abdomen upright]
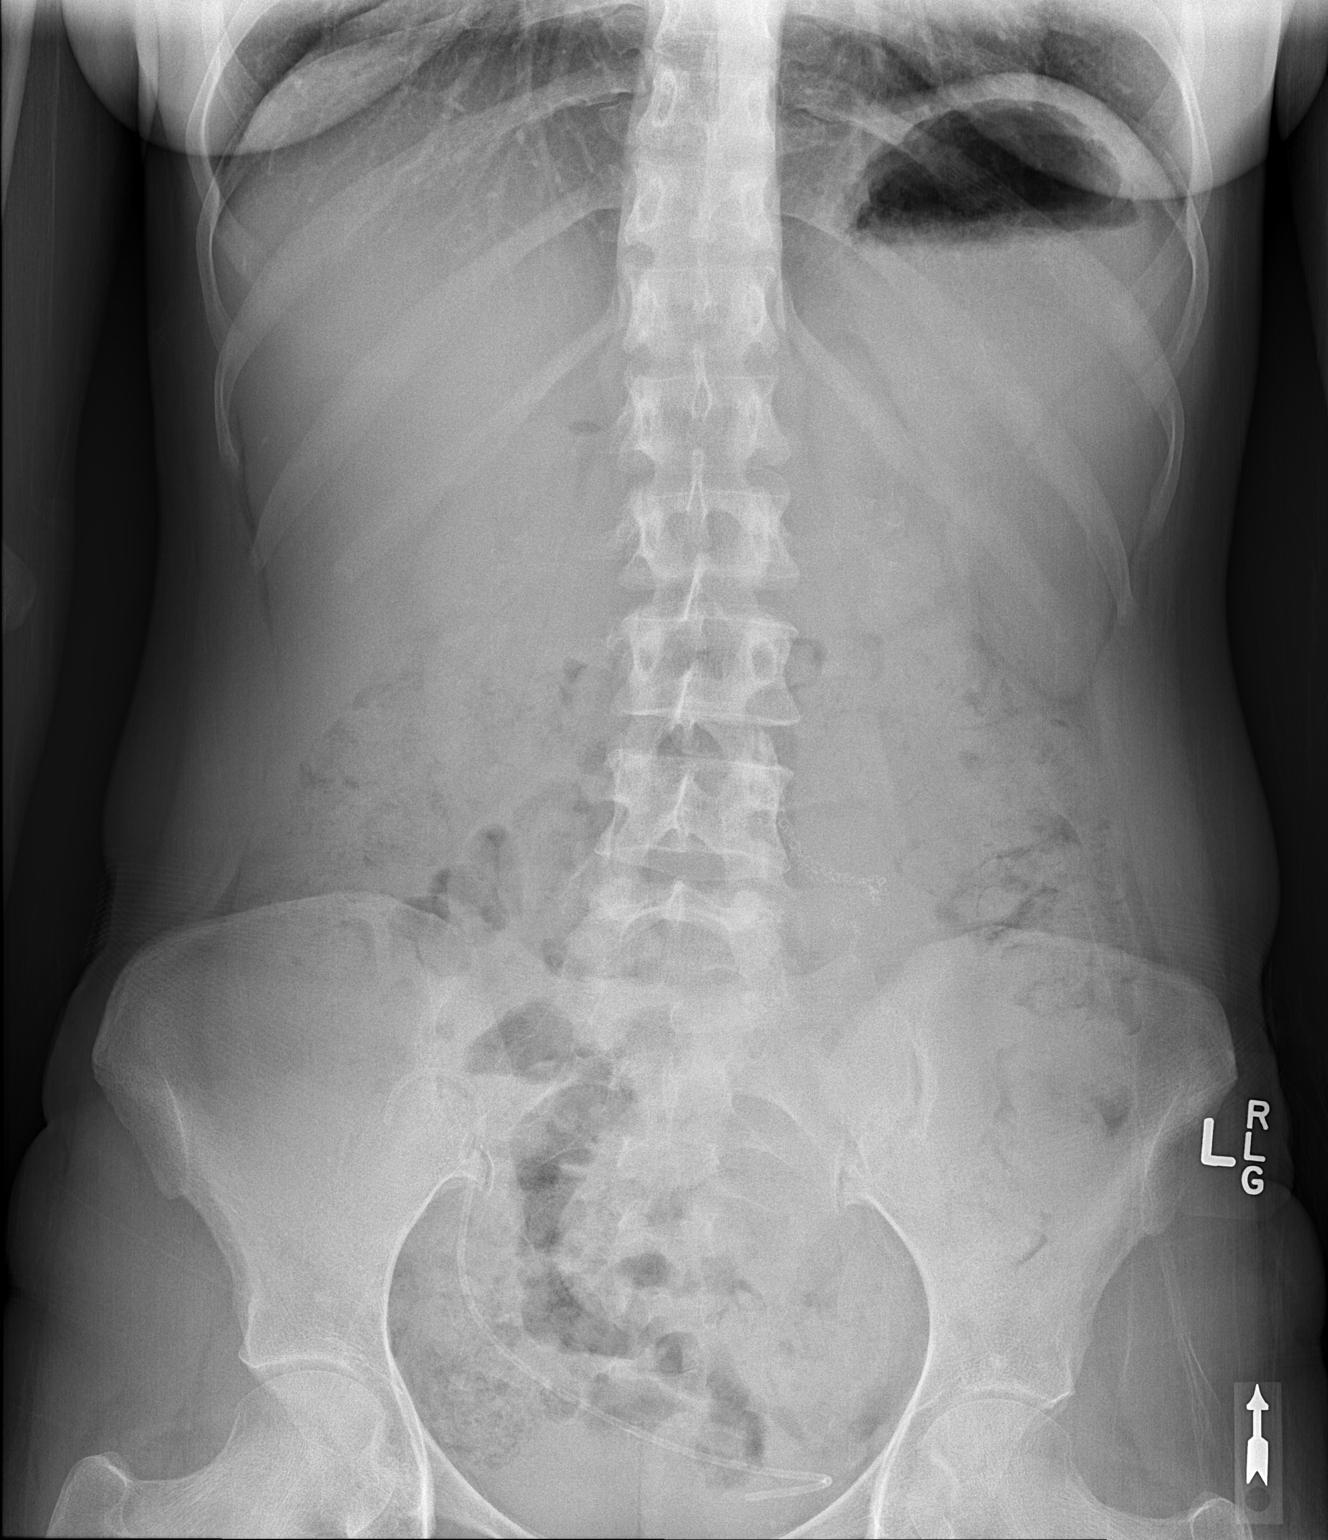

[t abdomen supine]
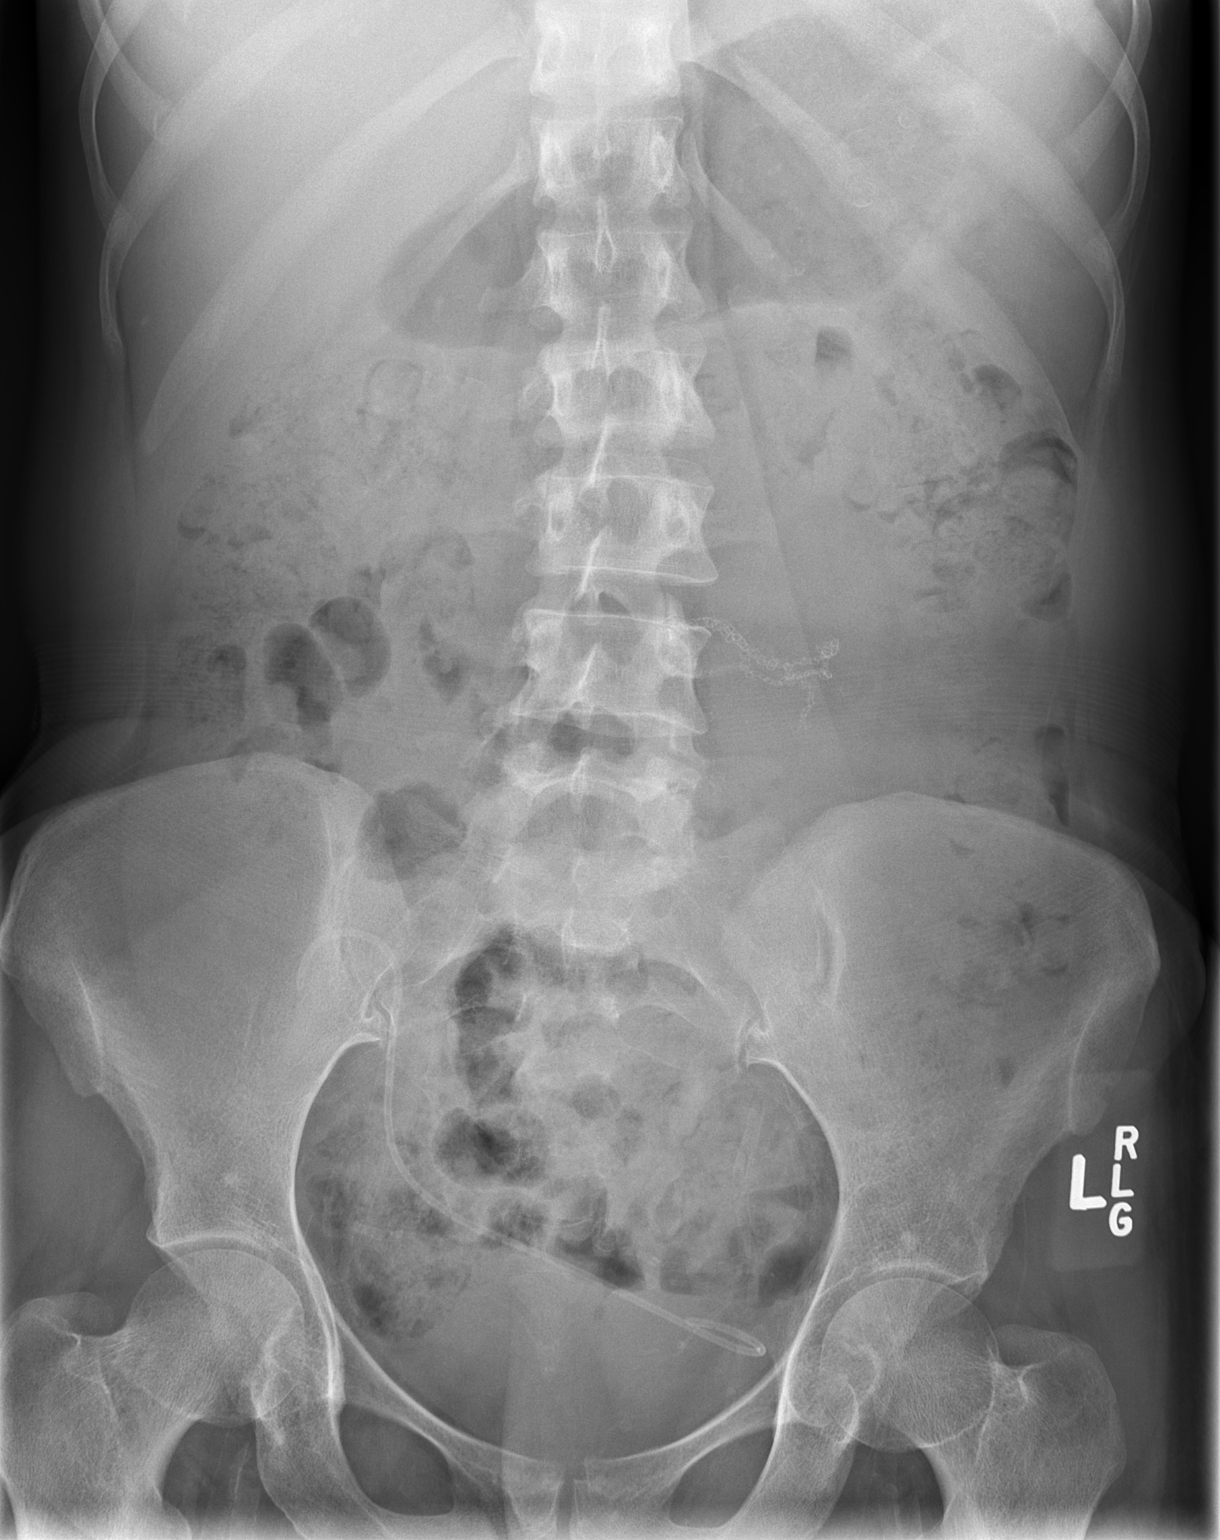

[3 of 3 positions shown; findings below may reference images not displayed]

FINDINGS: Lungs are clear. No pleural fluid or pulmonary edema. No free air
beneath hemidiaphragms.

No dilated loops of large or small bowel. There is a moderate volume
stool throughout the colon. Ureteral stent extends from the right
iliac fossa to the bladder unchanged from prior. Surgical staples in
the left lower quadrant.
IMPRESSION: 1.  No acute cardiopulmonary process.
2. No bowel obstruction or free air.
3. Moderate volume stool throughout colon

## 2013-10-22 MED ORDER — POLYETHYLENE GLYCOL 3350 17 G PO PACK: 17.0000 g | PACK | Freq: Every day | ORAL | Status: DC

## 2013-10-22 MED ORDER — HYDROMORPHONE HCL PF 1 MG/ML IJ SOLN
0.5000 mg | Freq: Once | INTRAMUSCULAR | Status: AC
  Administered 2013-10-22: 0.5 mg via INTRAVENOUS
  Filled 2013-10-22: qty 1

## 2013-10-22 MED ORDER — PROMETHAZINE HCL 25 MG/ML IJ SOLN
12.5000 mg | Freq: Once | INTRAMUSCULAR | Status: AC
  Administered 2013-10-22: 12.5 mg via INTRAVENOUS
  Filled 2013-10-22: qty 1

## 2013-10-22 MED ORDER — ONDANSETRON HCL 4 MG/2ML IJ SOLN
4.0000 mg | Freq: Once | INTRAMUSCULAR | Status: AC
  Administered 2013-10-22: 4 mg via INTRAVENOUS
  Filled 2013-10-22: qty 2

## 2013-10-22 MED ORDER — ONDANSETRON HCL 8 MG PO TABS: 8.0000 mg | ORAL_TABLET | Freq: Three times a day (TID) | ORAL | Status: DC | PRN

## 2013-10-22 MED ORDER — SODIUM CHLORIDE 0.9 % IV BOLUS (SEPSIS)
1000.0000 mL | Freq: Once | INTRAVENOUS | Status: AC
  Administered 2013-10-22: 1000 mL via INTRAVENOUS

## 2013-10-22 NOTE — ED Notes (Signed)
Patient is alert and oriented x3.  She was given DC instructions and follow up visit instructions.  Patient gave verbal understanding. She was DC ambulatory under her own power to home.  V/S stable.  He was not showing any signs of distress on DC 

## 2013-10-22 NOTE — ED Provider Notes (Signed)
Medical screening examination/treatment/procedure(s) were performed by non-physician practitioner and as supervising physician I was immediately available for consultation/collaboration.  EKG Interpretation   None       Rolland Porter, MD, Abram Sander   Janice Norrie, MD 10/22/13 [phone]

## 2013-10-22 NOTE — ED Provider Notes (Signed)
Medical screening examination/treatment/procedure(s) were performed by non-physician practitioner and as supervising physician I was immediately available for consultation/collaboration.  EKG Interpretation   None       Rolland Porter, MD, Abram Sander   Janice Norrie, MD 10/22/13 [id number]

## 2013-10-22 NOTE — Discharge Instructions (Signed)
Take miralax as prescribed for constipation.  Only take your ultram as absolutely necessary because this may be contributing to your constipation and therefore abdominal pain.  Continue your reglan for nausea and gastroparesis; you can supplement with zofran.  The B.R.A.T. Diet (bananas, rice, applesauce and toast) consists of foods that are less likely to upset your stomach.  Follow up with your doctor at [facility] tomorrow if possible.   You may return to the ER if symptoms worsen or you have any other concerns.

## 2013-10-22 NOTE — ED Provider Notes (Signed)
Pt received from [place], [place].  Pt has chronic gastroparesis and seen in ED frequently for abdominal pain and vomiting.  Typical sx today.  Labs unremarkable.  Acute abd series shows moderate amt of stool.  All results have been discussed w/ patient.  She reports persistent pain, though she has been eating here in ED.  I offered repeat dosing of dilaudid as well as zofran prior to discharge.  Denied her request for prescribed analgesic and explained that chronic narcotic use is likely contributing to her symptoms.  Prescribed miralax and zofran; she has reglan at home.  Advised f/u with her physician at [facility]  1:56 AM   Remer Macho, PA-C 10/22/13 [phone]

## 2013-10-22 NOTE — ED Provider Notes (Signed)
CSN: [id number]     Arrival date & time 10/21/13  2338 History   First MD Initiated Contact with Patient 10/21/13 2351     Chief Complaint  Patient presents with  . Abdominal Pain    Left side     (Consider location/radiation/quality/duration/timing/severity/associated sxs/prior Treatment) HPI [name] [name] is a 33 y.o. female who presents to emergency department complaining of upper abdominal pain, nausea, vomiting. 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 This was done at [facility]. She states she currently follows up with them, and is in pain treatment. She states that she has had issues with recurrent nausea, vomiting, abdominal pain since her surgery. She states that this episode has been there for a month. She states that she is prescribed her Reglan which he takes daily but is not helping her symptoms. States she was admitted to [facility] 8 days ago where she sustained and her symptoms improved 12 area. She states she was again seen here 2 days ago and was discharged home with Phenergan which she states helps. She said today her symptoms worsened. She reports constant nausea, 2 episodes of emesis. She reports one episode of watery diarrhea. Patient reports associated dizziness. She states her pain is 10 out 10. States has had exact same symptoms in the past, and was told that this is probably gastroparesis. She states she is unable to eat or drink due to nausea. She denies any fever. No urinary symptoms. No back pain.   Past Medical History  Diagnosis Date  . DIABETES MELLITUS, TYPE I, UNCONTROLLED 11/05/2007  . MEDIAL EPICONDYLITIS, LEFT 12/17/2007  . Vision loss   . Renal disorder   . Diabetes mellitus   . Hypertension   . Anxiety    Past Surgical History  Procedure Laterality Date  . Refractive surgery    . Eye surgery    . Av fistula placement  11/17/2011    Procedure:  ARTERIOVENOUS (AV) FISTULA CREATION;  Surgeon: Rosetta Posner, MD;  Location: [place];  Service: Vascular;  Laterality: Right;  . Kidney transplant    . Pancrease transplant     Family History  Problem Relation Age of Onset  . Hypertension Father    History  Substance Use Topics  . Smoking status: Former Smoker -- 1 years    Types: Pipe  . Smokeless tobacco: Never Used     Comment: Hooka  . Alcohol Use: No   OB History   Grav Para Term Preterm Abortions TAB SAB Ect Mult Living                 Review of Systems  Constitutional: Negative for fever and chills.  Respiratory: Negative for cough, chest tightness and shortness of breath.   Cardiovascular: Negative for chest pain, palpitations and leg swelling.  Gastrointestinal: Positive for nausea, vomiting, abdominal pain and diarrhea.  Genitourinary: Negative for dysuria, flank pain and pelvic pain.  Musculoskeletal: Negative for arthralgias, myalgias, neck pain and neck stiffness.  Skin: Negative for rash.  Neurological: Positive for dizziness and light-headedness. Negative for weakness and headaches.  All other systems reviewed and are negative.      Allergies  Shrimp  Home Medications   Current Outpatient Rx  Name  Route  Sig  Dispense  Refill  . aspirin 81 MG tablet   Oral   Take 81 mg by mouth daily.         Marland Kitchen  cycloSPORINE (SANDIMMUNE) 100 MG capsule   Oral   Take 200 mg by mouth 2 (two) times daily. 275 mg total         . cycloSPORINE (SANDIMMUNE) 25 MG capsule   Oral   Take 75 mg by mouth 2 (two) times daily.          Marland Kitchen docusate sodium (COLACE) 100 MG capsule   Oral   Take 100 mg by mouth 2 (two) times daily as needed for mild constipation.          . gabapentin (NEURONTIN) 100 MG capsule   Oral   Take 100 mg by mouth 3 (three) times daily.         . metoCLOPramide (REGLAN) 10 MG tablet   Oral   Take 10 mg by mouth 3 (three) times daily before meals.         . mycophenolate (MYFORTIC) 180  MG EC tablet   Oral   Take 540 mg by mouth 2 (two) times daily.          . traMADol (ULTRAM) 50 MG tablet   Oral   Take 50 mg by mouth every 6 (six) hours as needed for moderate pain. Take for 10 days. Started medication on finished medication today 10-14-13          BP 170/87  Pulse 81  Temp(Src) 98.7 F (37.1 C) (Oral)  Resp 18  SpO2 100%  LMP 10/14/2013 Physical Exam  Nursing note and vitals reviewed. Constitutional: She appears well-developed and well-nourished. No distress.  HENT:  Head: Normocephalic.  Eyes: Conjunctivae are normal.  Neck: Neck supple.  Cardiovascular: Normal rate, regular rhythm and normal heart sounds.   Pulmonary/Chest: Effort normal and breath sounds normal. No respiratory distress. She has no wheezes. She has no rales.  Abdominal: Soft. Bowel sounds are normal. She exhibits no distension. There is tenderness. There is no rebound and no guarding.  LUQ tenderness  Musculoskeletal: She exhibits no edema.  Neurological: She is alert.  Skin: Skin is warm and dry.  Psychiatric: She has a normal mood and affect. Her behavior is normal.    ED Course  Procedures (including critical care time) Labs Review Labs Reviewed  URINALYSIS, ROUTINE W REFLEX MICROSCOPIC  CBC WITH DIFFERENTIAL  COMPREHENSIVE METABOLIC PANEL  LIPASE, BLOOD   Imaging Review No results found.  EKG Interpretation   None       MDM   Final diagnoses:  None    Patient history of kidney and pancreas transplant which cured her diabetes however she continues to have trouble with gastroparesis and recurrent nausea vomiting and abdominal pain. From chart review it looks like patient has been here with frequent visits as well as frequent visits to [facility]. It appears she is in pain management. She's currently taking tramadol for her pain. She's also taking Reglan for her gastroparesis which she states currently is not helping. On exam she has no peritoneal signs, her  pain and tenderness is in the left upper quadrant only.Labs ordered, fluids, Phenergan for nausea, Dilaudid for pain. Will monitor.  12:52 AM Pt reassessed. Feeling better. Acute abdom series show moderate stool, otherwise normal. I walked into the room to update pt, and i found her eating food which she quickly hid from me. Labs still pending. Will sign out pt with PA schinlever at shift change.   Filed Vitals:   10/21/13 2342  BP: 170/87  Pulse: 81  Temp: 98.7 F (37.1 C)  TempSrc: Oral  Resp: 18  SpO2: 100%     Renold Genta, PA-C 10/22/13 [phone]

## 2013-10-23 ENCOUNTER — Encounter (HOSPITAL_COMMUNITY): Payer: Self-pay | Admitting: Emergency Medicine

## 2013-10-23 ENCOUNTER — Emergency Department (HOSPITAL_COMMUNITY)
Admission: EM | Admit: 2013-10-23 | Discharge: 2013-10-23 | Discharge status: Against Medical Advice | Payer: Medicare Other | Attending: Emergency Medicine | Admitting: Emergency Medicine

## 2013-10-23 DIAGNOSIS — I10 Essential (primary) hypertension: Secondary | ICD-10-CM | POA: Insufficient documentation

## 2013-10-23 DIAGNOSIS — E1065 Type 1 diabetes mellitus with hyperglycemia: Secondary | ICD-10-CM | POA: Insufficient documentation

## 2013-10-23 DIAGNOSIS — R1012 Left upper quadrant pain: Secondary | ICD-10-CM | POA: Insufficient documentation

## 2013-10-23 DIAGNOSIS — IMO0002 Reserved for concepts with insufficient information to code with codable children: Secondary | ICD-10-CM | POA: Insufficient documentation

## 2013-10-23 NOTE — ED Notes (Signed)
Patient presents with pain to the left upper quadrant that has not subsided for the past 2 days

## 2013-10-23 NOTE — ED Notes (Signed)
LUQ pain for 2 days seen at [facility]. Prescriptions received.  EMS BP 170/98 P 84 R16 99%

## 2013-10-23 NOTE — ED Notes (Signed)
Pt did not respond to call from waiting room x 2

## 2013-10-23 NOTE — ED Notes (Signed)
Pt did not respond to call from waiting room x 1

## 2013-10-24 ENCOUNTER — Emergency Department (HOSPITAL_COMMUNITY): Payer: Medicare Other

## 2013-10-24 ENCOUNTER — Encounter (HOSPITAL_COMMUNITY): Payer: Self-pay | Admitting: Emergency Medicine

## 2013-10-24 ENCOUNTER — Emergency Department (HOSPITAL_COMMUNITY)
Admission: EM | Admit: 2013-10-24 | Discharge: 2013-10-25 | Disposition: A | Discharge status: Home / Self Care | Payer: Medicare Other | Attending: Emergency Medicine | Admitting: Emergency Medicine

## 2013-10-24 DIAGNOSIS — IMO0002 Reserved for concepts with insufficient information to code with codable children: Secondary | ICD-10-CM | POA: Insufficient documentation

## 2013-10-24 DIAGNOSIS — G8929 Other chronic pain: Secondary | ICD-10-CM | POA: Insufficient documentation

## 2013-10-24 DIAGNOSIS — Z79899 Other long term (current) drug therapy: Secondary | ICD-10-CM | POA: Insufficient documentation

## 2013-10-24 DIAGNOSIS — F411 Generalized anxiety disorder: Secondary | ICD-10-CM | POA: Insufficient documentation

## 2013-10-24 DIAGNOSIS — R319 Hematuria, unspecified: Secondary | ICD-10-CM | POA: Insufficient documentation

## 2013-10-24 DIAGNOSIS — Z87891 Personal history of nicotine dependence: Secondary | ICD-10-CM | POA: Insufficient documentation

## 2013-10-24 DIAGNOSIS — R109 Unspecified abdominal pain: Secondary | ICD-10-CM

## 2013-10-24 DIAGNOSIS — Z3202 Encounter for pregnancy test, result negative: Secondary | ICD-10-CM | POA: Insufficient documentation

## 2013-10-24 DIAGNOSIS — E1065 Type 1 diabetes mellitus with hyperglycemia: Secondary | ICD-10-CM | POA: Insufficient documentation

## 2013-10-24 DIAGNOSIS — Z8739 Personal history of other diseases of the musculoskeletal system and connective tissue: Secondary | ICD-10-CM | POA: Insufficient documentation

## 2013-10-24 DIAGNOSIS — I10 Essential (primary) hypertension: Secondary | ICD-10-CM | POA: Insufficient documentation

## 2013-10-24 DIAGNOSIS — R1012 Left upper quadrant pain: Secondary | ICD-10-CM | POA: Insufficient documentation

## 2013-10-24 DIAGNOSIS — Z7982 Long term (current) use of aspirin: Secondary | ICD-10-CM | POA: Insufficient documentation

## 2013-10-24 DIAGNOSIS — R112 Nausea with vomiting, unspecified: Secondary | ICD-10-CM | POA: Insufficient documentation

## 2013-10-24 LAB — CBC WITH DIFFERENTIAL/PLATELET
BASOS ABS: 0 10*3/uL (ref 0.0–0.1)
BASOS PCT: 0 % (ref 0–1)
Eosinophils Absolute: 0.1 10*3/uL (ref 0.0–0.7)
Eosinophils Relative: 4 % (ref 0–5)
HCT: 33.9 % — ABNORMAL LOW (ref 36.0–46.0)
Hemoglobin: 11.4 g/dL — ABNORMAL LOW (ref 12.0–15.0)
Lymphocytes Relative: 19 % (ref 12–46)
Lymphs Abs: 0.4 10*3/uL — ABNORMAL LOW (ref 0.7–4.0)
MCH: 29.9 pg (ref 26.0–34.0)
MCHC: 33.6 g/dL (ref 30.0–36.0)
MCV: 89 fL (ref 78.0–100.0)
MONO ABS: 0.3 10*3/uL (ref 0.1–1.0)
Monocytes Relative: 15 % — ABNORMAL HIGH (ref 3–12)
NEUTROS ABS: 1.4 10*3/uL — AB (ref 1.7–7.7)
Neutrophils Relative %: 62 % (ref 43–77)
Platelets: 181 10*3/uL (ref 150–400)
RBC: 3.81 MIL/uL — ABNORMAL LOW (ref 3.87–5.11)
RDW: 15 % (ref 11.5–15.5)
WBC: 2.3 10*3/uL — ABNORMAL LOW (ref 4.0–10.5)

## 2013-10-24 LAB — URINALYSIS, ROUTINE W REFLEX MICROSCOPIC
Bilirubin Urine: NEGATIVE
Glucose, UA: NEGATIVE mg/dL
Ketones, ur: NEGATIVE mg/dL
NITRITE: NEGATIVE
Protein, ur: NEGATIVE mg/dL
Specific Gravity, Urine: 1.021 (ref 1.005–1.030)
Urobilinogen, UA: 0.2 mg/dL (ref 0.0–1.0)
pH: 6.5 (ref 5.0–8.0)

## 2013-10-24 LAB — COMPREHENSIVE METABOLIC PANEL
ALBUMIN: 3.4 g/dL — AB (ref 3.5–5.2)
ALT: 8 U/L (ref 0–35)
AST: 12 U/L (ref 0–37)
Alkaline Phosphatase: 95 U/L (ref 39–117)
BUN: 38 mg/dL — ABNORMAL HIGH (ref 6–23)
CO2: 22 mEq/L (ref 19–32)
CREATININE: 1.16 mg/dL — AB (ref 0.50–1.10)
Calcium: 9.6 mg/dL (ref 8.4–10.5)
Chloride: 104 mEq/L (ref 96–112)
GFR calc Af Amer: 72 mL/min — ABNORMAL LOW (ref >90)
GFR calc non Af Amer: 62 mL/min — ABNORMAL LOW (ref >90)
Glucose, Bld: 90 mg/dL (ref 70–99)
Potassium: 4.8 mEq/L (ref 3.7–5.3)
Sodium: 139 mEq/L (ref 137–147)
Total Bilirubin: 0.2 mg/dL — ABNORMAL LOW (ref 0.3–1.2)
Total Protein: 7.4 g/dL (ref 6.0–8.3)

## 2013-10-24 LAB — URINE MICROSCOPIC-ADD ON

## 2013-10-24 LAB — LIPASE, BLOOD: Lipase: 34 U/L (ref 11–59)

## 2013-10-24 LAB — POC URINE PREG, ED: PREG TEST UR: NEGATIVE

## 2013-10-24 IMAGING — CR DG ABDOMEN ACUTE W/ 1V CHEST
3 series · 3 of 3 positions shown · non-contrast
Comparison: Abdominal series [DATE], CT abdomen [DATE] and
[DATE].

CLINICAL DATA: Pain .

EXAM:
ACUTE ABDOMEN SERIES (ABDOMEN 2 VIEW & CHEST 1 VIEW)

[w chest pa]
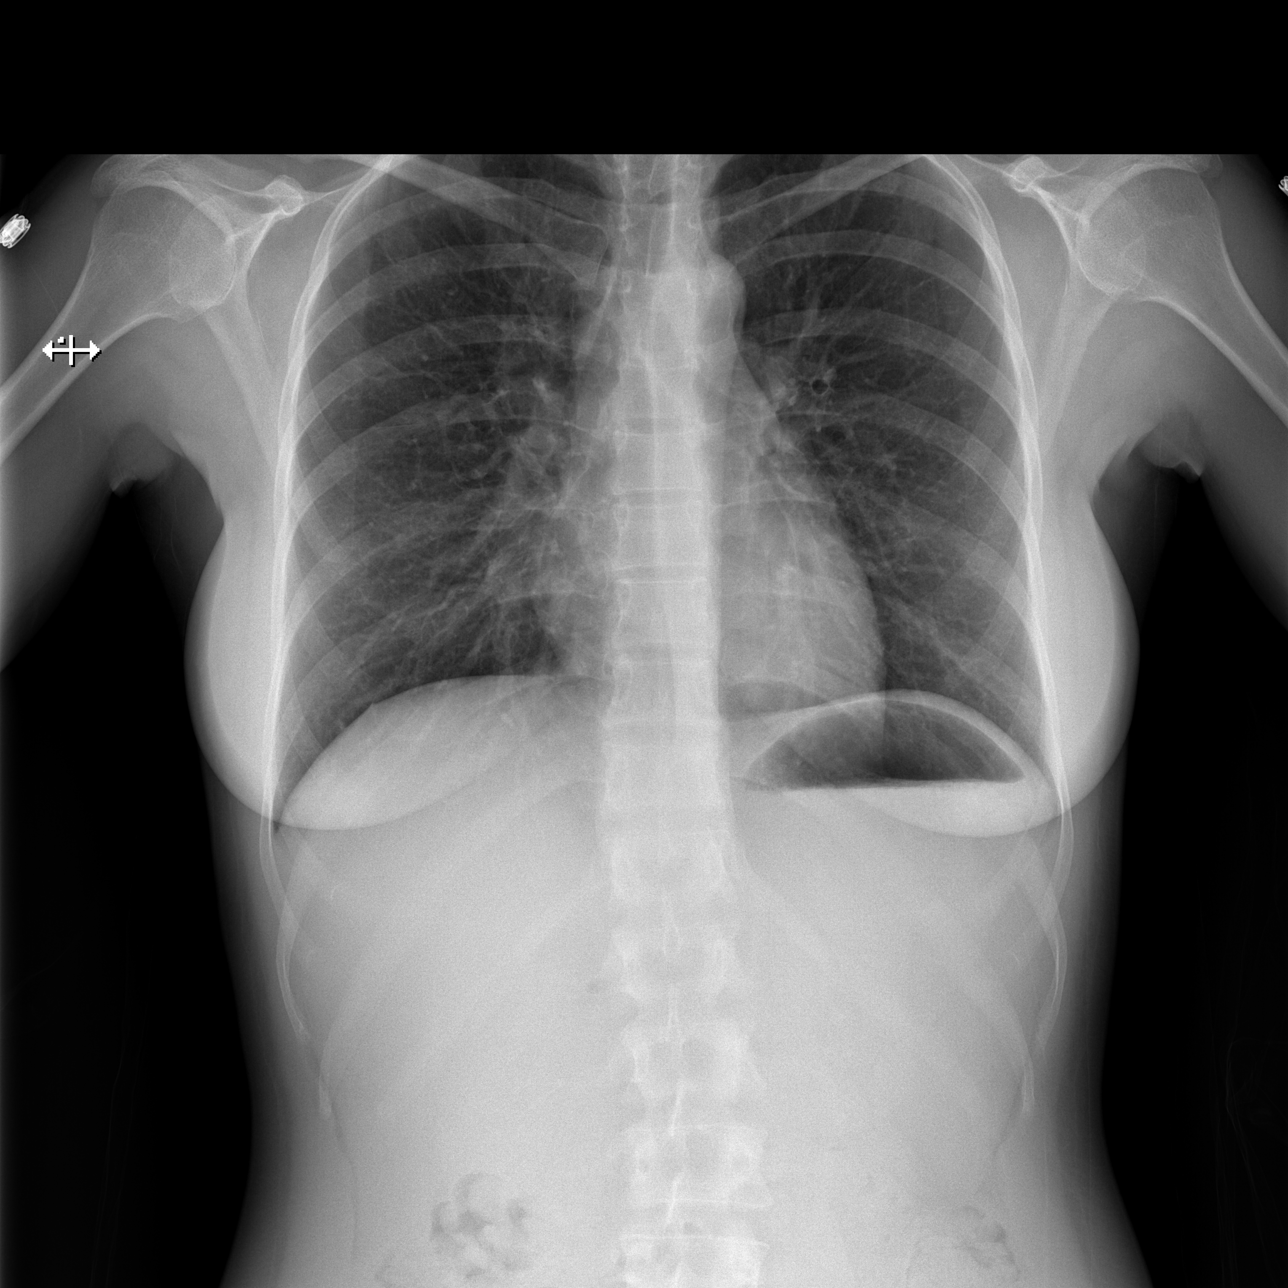

[w abdomen upright]
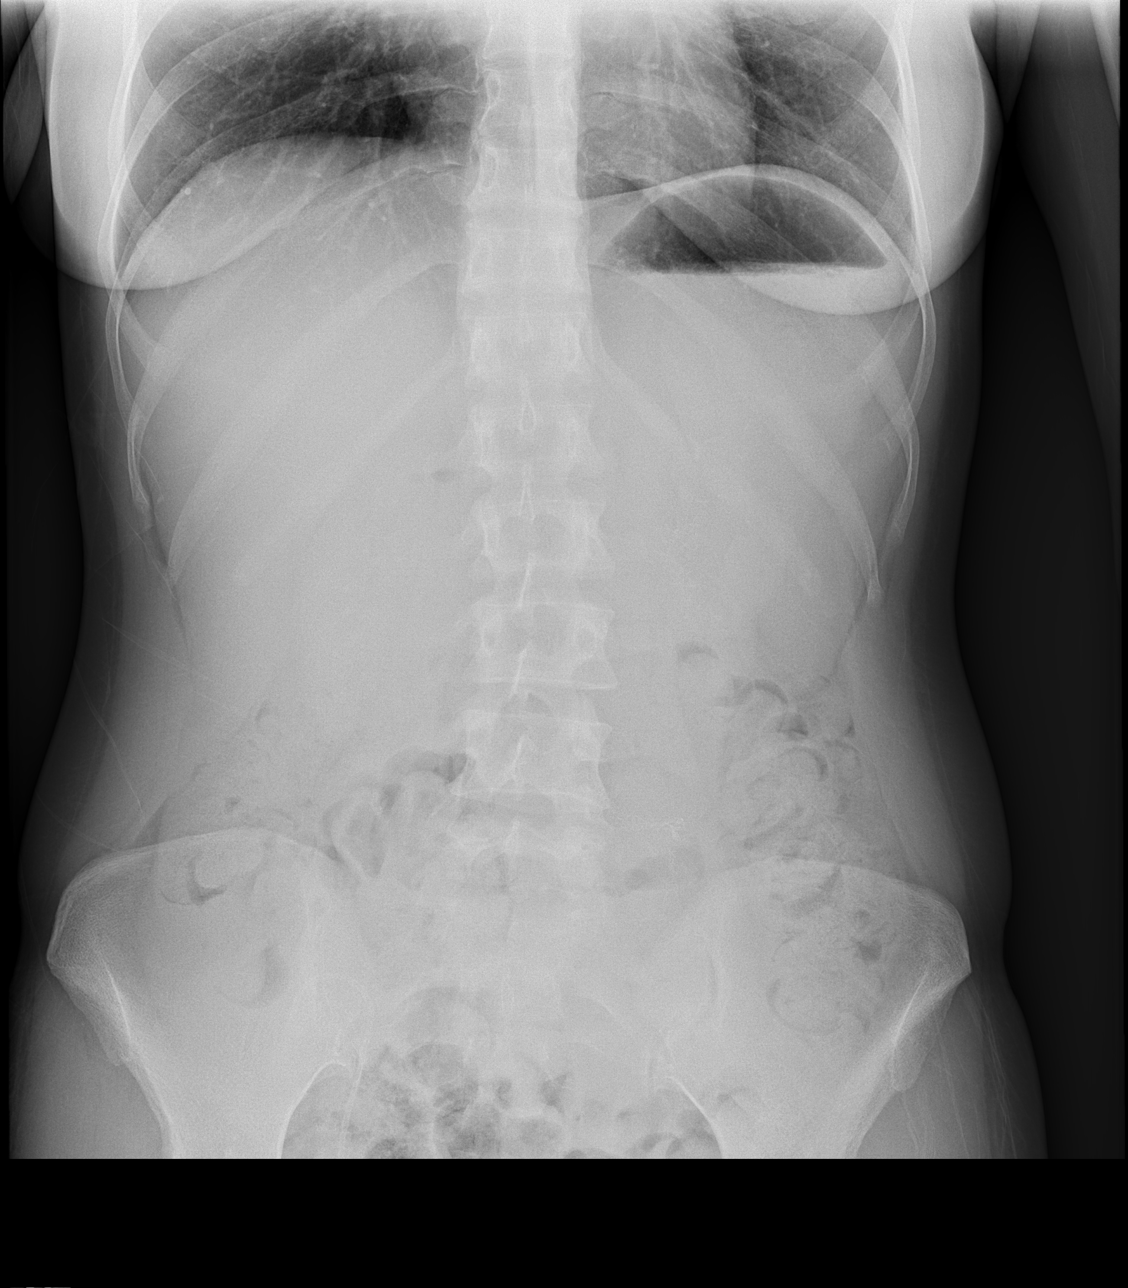

[t abdomen supine]
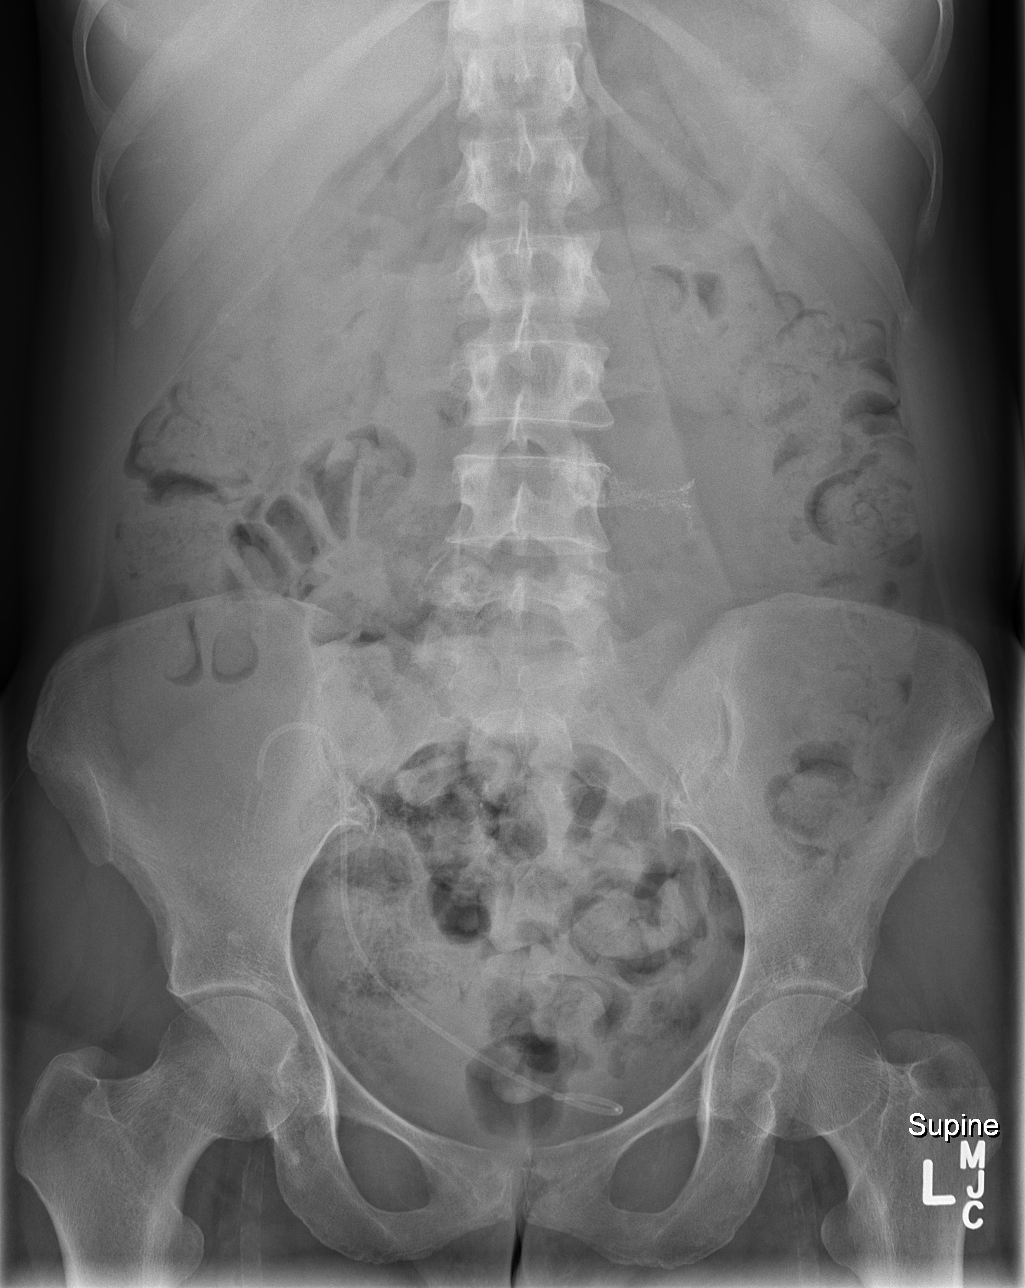

[3 of 3 positions shown; findings below may reference images not displayed]

FINDINGS: No acute cardiopulmonary disease.

Soft tissues the abdomen unremarkable. Large amount of stool is
noted throughout the colon suggesting constipation. Surgical sutures
in the mid abdomen. A ureteral stent is noted projected
approximately of the right iliac fossa and distally of the bladder.
This is unchanged in position. Tiny sclerotic densities in pelvis
most likely bone islands, these are unchanged.
IMPRESSION: 1. No acute cardiopulmonary disease.
2. Constipation.  No evidence of bowel obstruction.
3. Surgical sutures over mid abdomen. Ureteral stent projected over
the right pelvis, unchanged in position .

## 2013-10-24 MED ORDER — PROMETHAZINE HCL 25 MG PO TABS: 25.0000 mg | ORAL_TABLET | Freq: Four times a day (QID) | ORAL | Status: DC | PRN

## 2013-10-24 MED ORDER — SODIUM CHLORIDE 0.9 % IV BOLUS (SEPSIS)
1000.0000 mL | Freq: Once | INTRAVENOUS | Status: AC
  Administered 2013-10-24: 1000 mL via INTRAVENOUS

## 2013-10-24 MED ORDER — ONDANSETRON HCL 4 MG/2ML IJ SOLN
4.0000 mg | Freq: Once | INTRAMUSCULAR | Status: AC
  Administered 2013-10-24: 4 mg via INTRAVENOUS
  Filled 2013-10-24: qty 2

## 2013-10-24 MED ORDER — MORPHINE SULFATE 4 MG/ML IJ SOLN
4.0000 mg | Freq: Once | INTRAMUSCULAR | Status: AC
  Administered 2013-10-24: 4 mg via INTRAVENOUS
  Filled 2013-10-24: qty 1

## 2013-10-24 MED ORDER — PROMETHAZINE HCL 25 MG/ML IJ SOLN
12.5000 mg | Freq: Four times a day (QID) | INTRAMUSCULAR | Status: DC | PRN
  Administered 2013-10-24: 12.5 mg via INTRAVENOUS
  Filled 2013-10-24: qty 1

## 2013-10-24 MED ORDER — FENTANYL CITRATE 0.05 MG/ML IJ SOLN
50.0000 ug | Freq: Once | INTRAMUSCULAR | Status: AC
  Administered 2013-10-24: 50 ug via INTRAVENOUS
  Filled 2013-10-24: qty 2

## 2013-10-24 MED ORDER — MORPHINE SULFATE 4 MG/ML IJ SOLN
6.0000 mg | Freq: Once | INTRAMUSCULAR | Status: AC
  Administered 2013-10-24: 6 mg via INTRAVENOUS
  Filled 2013-10-24: qty 2

## 2013-10-24 NOTE — ED Provider Notes (Addendum)
CSN: [id number]     Arrival date & time 10/24/13  1942 History   First MD Initiated Contact with Patient 10/24/13 2006     Chief Complaint  Patient presents with  . Abdominal Pain     (Consider location/radiation/quality/duration/timing/severity/associated sxs/prior Treatment) HPI Complains of left-sided abdominal pain x1 month worse at left upper quadrant. She had similar pain since receiving a kidney pancreas transplant. Pain is worse with vomiting not improved by anything. Pain is constant she's been taking Tramadol without adequate pain relief. Patient vomited 3 times today. No fever. Last menstrual period was 10/20/2013. Last bowel movement today normal. She presents today she saw blood in her urine. Denies hematemesis denies fever. No other associated symptoms. Past Medical History  Diagnosis Date  . DIABETES MELLITUS, TYPE I, UNCONTROLLED 11/05/2007  . MEDIAL EPICONDYLITIS, LEFT 12/17/2007  . Vision loss   . Renal disorder   . Diabetes mellitus   . Hypertension   . Anxiety    Past Surgical History  Procedure Laterality Date  . Refractive surgery    . Eye surgery    . Av fistula placement  11/17/2011    Procedure: ARTERIOVENOUS (AV) FISTULA CREATION;  Surgeon: Rosetta Posner, MD;  Location: [place];  Service: Vascular;  Laterality: Right;  . Kidney transplant    . Pancrease transplant     Family History  Problem Relation Age of Onset  . Hypertension Father    History  Substance Use Topics  . Smoking status: Former Smoker -- 1 years    Types: Pipe  . Smokeless tobacco: Never Used     Comment: Hooka  . Alcohol Use: No   OB History   Grav Para Term Preterm Abortions TAB SAB Ect Mult Living                 Review of Systems  Gastrointestinal: Positive for nausea, vomiting and abdominal pain.  Genitourinary: Positive for hematuria.  All other systems reviewed and are negative.      Allergies  Shrimp  Home Medications   Current Outpatient Rx  Name  Route  Sig   Dispense  Refill  . aspirin 81 MG tablet   Oral   Take 81 mg by mouth daily.         . cycloSPORINE (SANDIMMUNE) 100 MG capsule   Oral   Take 200 mg by mouth 2 (two) times daily. 275 mg total dose twice daily.         . cycloSPORINE (SANDIMMUNE) 25 MG capsule   Oral   Take 75 mg by mouth 2 (two) times daily. 275 mg total dose twice daily.         Marland Kitchen gabapentin (NEURONTIN) 100 MG capsule   Oral   Take 100 mg by mouth 3 (three) times daily.         . metoCLOPramide (REGLAN) 10 MG tablet   Oral   Take 10 mg by mouth 3 (three) times daily before meals.         . mycophenolate (MYFORTIC) 180 MG EC tablet   Oral   Take 540 mg by mouth 2 (two) times daily.          . ondansetron (ZOFRAN) 8 MG tablet   Oral   Take 1 tablet (8 mg total) by mouth every 8 (eight) hours as needed for nausea or vomiting.   20 tablet   0   . polyethylene glycol (MIRALAX) packet   Oral   Take 17 g by  mouth daily.   5 each   0    BP 141/86  Pulse 90  Temp(Src) 98.3 F (36.8 C) (Oral)  Resp 20  SpO2 98%  LMP 10/20/2013 Physical Exam  Nursing note and vitals reviewed. Constitutional: She appears well-developed and well-nourished. She appears distressed.  As above uncomfortable  HENT:  Head: Normocephalic and atraumatic.  Eyes: Conjunctivae are normal. Pupils are equal, round, and reactive to light.  Neck: Neck supple. No tracheal deviation present. No thyromegaly present.  Cardiovascular: Normal rate and regular rhythm.   No murmur heard. Pulmonary/Chest: Effort normal and breath sounds normal.  Abdominal: Soft. Bowel sounds are normal. She exhibits no distension and no mass. There is tenderness. There is no rebound and no guarding.  Midline surgical scar Tender at left upper quadrant  Musculoskeletal: Normal range of motion. She exhibits no edema and no tenderness.  Neurological: She is alert. Coordination normal.  Skin: Skin is warm and dry. No rash noted.  Psychiatric: She has  a normal mood and affect.    ED Course  Procedures (including critical care time) Labs Review Labs Reviewed  CBC WITH DIFFERENTIAL  COMPREHENSIVE METABOLIC PANEL  LIPASE, BLOOD  URINALYSIS, ROUTINE W REFLEX MICROSCOPIC  POC URINE PREG, ED   Imaging Review No results found.  EKG Interpretation   None      10:55 PM patient is much improved of treatment with intravenous opioids and antiemetics.  11:30 PM she feels very home she is able to drink without difficulty. Abdominal x-rays viewed by me Results for orders placed during the hospital encounter of 10/24/13  CBC WITH DIFFERENTIAL      Result Value Ref Range   WBC 2.3 (*) 4.0 - 10.5 K/uL   RBC 3.81 (*) 3.87 - 5.11 MIL/uL   Hemoglobin 11.4 (*) 12.0 - 15.0 g/dL   HCT 33.9 (*) 36.0 - 46.0 %   MCV 89.0  78.0 - 100.0 fL   MCH 29.9  26.0 - 34.0 pg   MCHC 33.6  30.0 - 36.0 g/dL   RDW 15.0  11.5 - 15.5 %   Platelets 181  150 - 400 K/uL   Neutrophils Relative % 62  43 - 77 %   Neutro Abs 1.4 (*) 1.7 - 7.7 K/uL   Lymphocytes Relative 19  12 - 46 %   Lymphs Abs 0.4 (*) 0.7 - 4.0 K/uL   Monocytes Relative 15 (*) 3 - 12 %   Monocytes Absolute 0.3  0.1 - 1.0 K/uL   Eosinophils Relative 4  0 - 5 %   Eosinophils Absolute 0.1  0.0 - 0.7 K/uL   Basophils Relative 0  0 - 1 %   Basophils Absolute 0.0  0.0 - 0.1 K/uL  COMPREHENSIVE METABOLIC PANEL      Result Value Ref Range   Sodium 139  137 - 147 mEq/L   Potassium 4.8  3.7 - 5.3 mEq/L   Chloride 104  96 - 112 mEq/L   CO2 22  19 - 32 mEq/L   Glucose, Bld 90  70 - 99 mg/dL   BUN 38 (*) 6 - 23 mg/dL   Creatinine, Ser 1.16 (*) 0.50 - 1.10 mg/dL   Calcium 9.6  8.4 - 10.5 mg/dL   Total Protein 7.4  6.0 - 8.3 g/dL   Albumin 3.4 (*) 3.5 - 5.2 g/dL   AST 12  0 - 37 U/L   ALT 8  0 - 35 U/L   Alkaline Phosphatase 95  39 -  117 U/L   Total Bilirubin 0.2 (*) 0.3 - 1.2 mg/dL   GFR calc non Af Amer 62 (*) >90 mL/min   GFR calc Af Amer 72 (*) >90 mL/min  LIPASE, BLOOD      Result Value  Ref Range   Lipase 34  11 - 59 U/L  URINALYSIS, ROUTINE W REFLEX MICROSCOPIC      Result Value Ref Range   Color, Urine YELLOW  YELLOW   APPearance CLEAR  CLEAR   Specific Gravity, Urine 1.021  1.005 - 1.030   pH 6.5  5.0 - 8.0   Glucose, UA NEGATIVE  NEGATIVE mg/dL   Hgb urine dipstick LARGE (*) NEGATIVE   Bilirubin Urine NEGATIVE  NEGATIVE   Ketones, ur NEGATIVE  NEGATIVE mg/dL   Protein, ur NEGATIVE  NEGATIVE mg/dL   Urobilinogen, UA 0.2  0.0 - 1.0 mg/dL   Nitrite NEGATIVE  NEGATIVE   Leukocytes, UA SMALL (*) NEGATIVE  URINE MICROSCOPIC-ADD ON      Result Value Ref Range   Squamous Epithelial / LPF RARE  RARE   WBC, UA 0-2  <3 WBC/hpf   RBC / HPF TOO NUMEROUS TO COUNT  <3 RBC/hpf  POC URINE PREG, ED      Result Value Ref Range   Preg Test, Ur NEGATIVE  NEGATIVE   Ct Abdomen Pelvis Wo Contrast  10/15/2013   CLINICAL DATA:  Left upper quadrant pain with emesis.  EXAM: CT ABDOMEN AND PELVIS WITHOUT CONTRAST  TECHNIQUE: Multidetector CT imaging of the abdomen and pelvis was performed following the standard protocol without intravenous contrast.  COMPARISON:  US ABDOMEN COMPLETE dated 11/25/2011; IR FLUORO GUIDE CV LINE*R* dated 11/16/2011; CT ABD/PELVIS W CM dated 04/22/2010  FINDINGS: Lung Bases: Clear.  Liver: Unenhanced CT was performed per clinician order. Lack of IV contrast limits sensitivity and specificity, especially for evaluation of abdominal/pelvic solid viscera. Grossly normal.  Spleen:  Old granulomatous disease.  Gallbladder:  Distended.  No calcified stones.  Common bile duct:  Normal.  Pancreas:  Grossly normal.  Adrenal glands:  Normal bilaterally.  Kidneys: Extensive renal hilar vascular calcifications. Right renal atrophy. No hydronephrosis. The ureters appear within normal limits. 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 The renal allograft demonstrates mild hydronephrosis which may be a chronic finding. Double-J a ureteral stent extends into the  urinary bladder.  Stomach:  Grossly normal.  Small bowel: Duodenum appears normal. Air-fluid levels are present within small bowel in the anterior abdomen and this appears adjacent to postoperative changes. No convincing evidence of small bowel obstruction allowing for lack of oral contrast. There is a metallic or more likely calcified object in the anterior abdomen open (image number 44 series 2, of unclear origin. This may represent postoperative changes associated with prior bowel surgery.  Colon: No inflammatory changes of colon. Large stool burden. Appendix not identified confidently.  Pelvic Genitourinary: Urinary bladder distended. Uterus appears within normal limits.  Bones: Dense bones compatible with renal osteodystrophy. No aggressive osseous lesions. Negative for AVN.  Vasculature: Extensive atherosclerosis, including dense visceral atherosclerosis.  Body Wall: Scarring in the midline.  Negative for hernia.  IMPRESSION: 1. No acute abnormality. 2. Right iliac fossa renal allograft with double-J ureteral stent. Mild hydronephrosis of the allograft is likely chronic based on the presence of the stent. 3. Large stool burden suggesting constipation in the appropriate clinical setting. 4. Mildly distended loops of small bowel with fluid levels in the central abdomen. This may represent  early obstruction or enteritis. Lack of oral contrast precludes further assessment.   Electronically Signed   By: Dereck Ligas M.D.   On: 10/15/2013 00:40   Dg Chest 2 View  10/17/2013   CLINICAL DATA:  Shortness of breath, history of multiple organ transplantation.  EXAM: CHEST  2 VIEW  COMPARISON:  CT ABD/PELV WO CM dated 10/14/2013; DG CHEST 2 VIEW dated 10/04/2012  FINDINGS: Grossly unchanged cardiac silhouette and mediastinal contours. The lungs appear mildly hyperexpanded with mild diffuse slightly nodular thickening of the pulmonary interstitium. No focal airspace opacities. No pleural effusion or pneumothorax. No  evidence of edema. No acute osseus abnormalities.  IMPRESSION: Mild lung hyperexpansion and bronchitic change without acute cardiopulmonary disease.   Electronically Signed   By: Sandi Mariscal M.D.   On: 10/17/2013 22:33   Dg Abd Acute W/chest  10/24/2013   CLINICAL DATA:  Pain .  EXAM: ACUTE ABDOMEN SERIES (ABDOMEN 2 VIEW & CHEST 1 VIEW)  COMPARISON:  Abdominal series 2/18/ 2015, CT abdomen 10/14/2013 and 04/22/2010.  FINDINGS: No acute cardiopulmonary disease.  Soft tissues the abdomen unremarkable. Large amount of stool is noted throughout the colon suggesting constipation. Surgical sutures in the mid abdomen. A ureteral stent is noted projected approximately of the right iliac fossa and distally of the bladder. This is unchanged in position. Tiny sclerotic densities in pelvis most likely bone islands, these are unchanged.  IMPRESSION: 1. No acute cardiopulmonary disease. 2. Constipation.  No evidence of bowel obstruction. 3. Surgical sutures over mid abdomen. Ureteral stent projected over the right pelvis, unchanged in position .   Electronically Signed   By: Marcello Moores  Register   On: 10/24/2013 21:29   Dg Abd Acute W/chest  10/22/2013   CLINICAL DATA:  Short of breath. History of renal transplant and pancreatic transplant  EXAM: ACUTE ABDOMEN SERIES (ABDOMEN 2 VIEW & CHEST 1 VIEW)  COMPARISON:  DG CHEST 2 VIEW dated 10/17/2013; CT ABD/PELV WO CM dated 10/14/2013  FINDINGS: Lungs are clear. No pleural fluid or pulmonary edema. No free air beneath hemidiaphragms.  No dilated loops of large or small bowel. There is a moderate volume stool throughout the colon. Ureteral stent extends from the right iliac fossa to the bladder unchanged from prior. Surgical staples in the left lower quadrant.  IMPRESSION: 1.  No acute cardiopulmonary process. 2. No bowel obstruction or free air. 3. Moderate volume stool throughout colon   Electronically Signed   By: Suzy Bouchard M.D.   On: 10/22/2013 00:38    MDM  Patient is  not tender over her transplanted kidney which is at right lower quadrant. Nontender over her transplanted pancreas which is at left mid abdomen Final diagnoses:  None   I had a lengthy conversation with Dr. Stann Mainland, transplant surgeon who states that he may. Likely secondary to retained ureteral stent which he needs to get out. Patient has had multiple workups at the transplant clinic and in the ED all negative . She had been referred to a pain clinic and has an appointment December 09 2013. Until then. Dr. Stann Mainland suggested she follow up with the clinic. No opioids the prescribed. She will be prescribed Phenergan she states to treat her nausea better than Zofran which she currently uses. Suggest blood pressure recheck one or 2 weeks Diagnosis #1 chronic abdominal pain #2 microscopic hematuria #3 elevated blood pressure     Orlie Dakin, MD 10/24/13 [id number]  Orlie Dakin, MD 10/25/13 0000  Orlie Dakin, MD 10/25/13 0000

## 2013-10-24 NOTE — ED Notes (Signed)
Pt reports LUQ ab pain that started at 5pm today with nausea and 3 episodes of vomiting. Pt reports last BM today and normal and noticed a little blood in urine. Pt tender to touch at LUQ. No injury noted to area. Previous surgical scar noted to ab area. Pt had kidney and pancreas transplant in November.

## 2013-10-24 NOTE — ED Notes (Signed)
Bed: [id number] Expected date: 10/24/13 Expected time: 7:35 PM Means of arrival:  Comments: 83 female from home

## 2013-10-24 NOTE — ED Notes (Signed)
Pt brought in by EMS. Pt c/o of ab pain to the L side with nausea no vomiting while with EMS. IV in place. 4mg  Zofran given en route. Pt alert upon arrival.

## 2013-10-25 ENCOUNTER — Emergency Department (HOSPITAL_COMMUNITY)
Admission: EM | Admit: 2013-10-25 | Discharge: 2013-10-26 | Disposition: A | Discharge status: Home / Self Care | Payer: Medicare Other | Attending: Emergency Medicine | Admitting: Emergency Medicine

## 2013-10-25 ENCOUNTER — Encounter (HOSPITAL_COMMUNITY): Payer: Self-pay | Admitting: Emergency Medicine

## 2013-10-25 ENCOUNTER — Emergency Department (HOSPITAL_COMMUNITY): Payer: Medicare Other

## 2013-10-25 DIAGNOSIS — K3184 Gastroparesis: Secondary | ICD-10-CM | POA: Insufficient documentation

## 2013-10-25 DIAGNOSIS — R1012 Left upper quadrant pain: Secondary | ICD-10-CM | POA: Insufficient documentation

## 2013-10-25 DIAGNOSIS — R109 Unspecified abdominal pain: Secondary | ICD-10-CM

## 2013-10-25 DIAGNOSIS — E1065 Type 1 diabetes mellitus with hyperglycemia: Secondary | ICD-10-CM | POA: Insufficient documentation

## 2013-10-25 DIAGNOSIS — Z3202 Encounter for pregnancy test, result negative: Secondary | ICD-10-CM | POA: Insufficient documentation

## 2013-10-25 DIAGNOSIS — N186 End stage renal disease: Secondary | ICD-10-CM | POA: Insufficient documentation

## 2013-10-25 DIAGNOSIS — F411 Generalized anxiety disorder: Secondary | ICD-10-CM | POA: Insufficient documentation

## 2013-10-25 DIAGNOSIS — IMO0002 Reserved for concepts with insufficient information to code with codable children: Secondary | ICD-10-CM | POA: Insufficient documentation

## 2013-10-25 DIAGNOSIS — G8929 Other chronic pain: Secondary | ICD-10-CM | POA: Insufficient documentation

## 2013-10-25 DIAGNOSIS — I120 Hypertensive chronic kidney disease with stage 5 chronic kidney disease or end stage renal disease: Secondary | ICD-10-CM | POA: Insufficient documentation

## 2013-10-25 DIAGNOSIS — Z79899 Other long term (current) drug therapy: Secondary | ICD-10-CM | POA: Insufficient documentation

## 2013-10-25 DIAGNOSIS — I10 Essential (primary) hypertension: Secondary | ICD-10-CM | POA: Insufficient documentation

## 2013-10-25 DIAGNOSIS — Z7982 Long term (current) use of aspirin: Secondary | ICD-10-CM | POA: Insufficient documentation

## 2013-10-25 DIAGNOSIS — Z992 Dependence on renal dialysis: Secondary | ICD-10-CM | POA: Insufficient documentation

## 2013-10-25 DIAGNOSIS — Z9483 Pancreas transplant status: Secondary | ICD-10-CM | POA: Insufficient documentation

## 2013-10-25 DIAGNOSIS — Z87891 Personal history of nicotine dependence: Secondary | ICD-10-CM | POA: Insufficient documentation

## 2013-10-25 DIAGNOSIS — Z940 Kidney transplant status: Secondary | ICD-10-CM | POA: Insufficient documentation

## 2013-10-25 HISTORY — DX: Gastroparesis: K31.84

## 2013-10-25 LAB — COMPREHENSIVE METABOLIC PANEL
ALT: 13 U/L (ref 0–35)
AST: 18 U/L (ref 0–37)
Albumin: 3.5 g/dL (ref 3.5–5.2)
Alkaline Phosphatase: 122 U/L — ABNORMAL HIGH (ref 39–117)
BUN: 35 mg/dL — ABNORMAL HIGH (ref 6–23)
CALCIUM: 9.8 mg/dL (ref 8.4–10.5)
CO2: 22 meq/L (ref 19–32)
CREATININE: 1.19 mg/dL — AB (ref 0.50–1.10)
Chloride: 106 mEq/L (ref 96–112)
GFR calc Af Amer: 70 mL/min — ABNORMAL LOW (ref >90)
GFR, EST NON AFRICAN AMERICAN: 60 mL/min — AB (ref >90)
Glucose, Bld: 87 mg/dL (ref 70–99)
Potassium: 5.2 mEq/L (ref 3.7–5.3)
SODIUM: 140 meq/L (ref 137–147)
TOTAL PROTEIN: 7.5 g/dL (ref 6.0–8.3)
Total Bilirubin: <0.2 mg/dL — ABNORMAL LOW (ref 0.3–1.2)

## 2013-10-25 LAB — URINE MICROSCOPIC-ADD ON

## 2013-10-25 LAB — CBC WITH DIFFERENTIAL/PLATELET
BASOS ABS: 0 10*3/uL (ref 0.0–0.1)
Basophils Relative: 0 % (ref 0–1)
EOS ABS: 0.1 10*3/uL (ref 0.0–0.7)
EOS PCT: 3 % (ref 0–5)
HCT: 36.2 % (ref 36.0–46.0)
Hemoglobin: 12.1 g/dL (ref 12.0–15.0)
LYMPHS PCT: 9 % — AB (ref 12–46)
Lymphs Abs: 0.3 10*3/uL — ABNORMAL LOW (ref 0.7–4.0)
MCH: 30.2 pg (ref 26.0–34.0)
MCHC: 33.4 g/dL (ref 30.0–36.0)
MCV: 90.3 fL (ref 78.0–100.0)
MONO ABS: 0.5 10*3/uL (ref 0.1–1.0)
Monocytes Relative: 16 % — ABNORMAL HIGH (ref 3–12)
Neutro Abs: 2.4 10*3/uL (ref 1.7–7.7)
Neutrophils Relative %: 72 % (ref 43–77)
Platelets: 180 10*3/uL (ref 150–400)
RBC: 4.01 MIL/uL (ref 3.87–5.11)
RDW: 14.9 % (ref 11.5–15.5)
WBC: 3.4 10*3/uL — AB (ref 4.0–10.5)

## 2013-10-25 LAB — URINALYSIS, ROUTINE W REFLEX MICROSCOPIC
Bilirubin Urine: NEGATIVE
GLUCOSE, UA: NEGATIVE mg/dL
Ketones, ur: NEGATIVE mg/dL
Nitrite: NEGATIVE
PROTEIN: NEGATIVE mg/dL
SPECIFIC GRAVITY, URINE: 1.017 (ref 1.005–1.030)
Urobilinogen, UA: 0.2 mg/dL (ref 0.0–1.0)
pH: 5 (ref 5.0–8.0)

## 2013-10-25 LAB — LIPASE, BLOOD: Lipase: 43 U/L (ref 11–59)

## 2013-10-25 LAB — PREGNANCY, URINE: PREG TEST UR: NEGATIVE

## 2013-10-25 MED ORDER — PROMETHAZINE HCL 25 MG/ML IJ SOLN
25.0000 mg | Freq: Once | INTRAMUSCULAR | Status: AC
  Administered 2013-10-25: 25 mg via INTRAVENOUS
  Filled 2013-10-25: qty 1

## 2013-10-25 MED ORDER — LORAZEPAM 2 MG/ML IJ SOLN
1.0000 mg | Freq: Once | INTRAMUSCULAR | Status: AC
  Administered 2013-10-25: 1 mg via INTRAVENOUS
  Filled 2013-10-25: qty 1

## 2013-10-25 MED ORDER — METOCLOPRAMIDE HCL 5 MG/ML IJ SOLN
10.0000 mg | Freq: Once | INTRAMUSCULAR | Status: AC
  Administered 2013-10-25: 10 mg via INTRAVENOUS
  Filled 2013-10-25: qty 2

## 2013-10-25 MED ORDER — SODIUM CHLORIDE 0.9 % IV BOLUS (SEPSIS)
1000.0000 mL | Freq: Once | INTRAVENOUS | Status: AC
  Administered 2013-10-25: 1000 mL via INTRAVENOUS

## 2013-10-25 NOTE — ED Provider Notes (Signed)
CSN: [id number]     Arrival date & time 10/25/13  1919 History   First MD Initiated Contact with Patient 10/25/13 2036     Chief Complaint  Patient presents with  . 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 She has had left sided abdominal pain for the past month. She states that she was seen at [facility] and told this was secondary to gastroparesis. She has been seen multiple times here and has had multiple workups. She was seen yesterday (dx chronic abdominal pain), 2/17( dx chronic abdominal pain), 2/14( chronic abdominal pain/possible medication withdrawal), 2/13(gastroparesis), and 2/10 then sent to [facility] for further assessment.  She has been having ongoing left upper quadrant pain.  She denies fever, chills, urinary symptoms, abnormal vaginal discharge.  She has had normal blood sugars since transplant and is not requiring insulin.    From transplant surgery not on2/11 at [facility] after transfer from here to [facility] Ms. Safass presented to the [facility] ED on 2/10 as a transfer from [facility] for abdominal pain. She states that this abdominal pain has been persistent for several weeks and is accompanied by nausea and vomiting. She reports non-bilious, non-bloody emesis after she eats. She states that these episodes of emesis precipitate her abdominal pain. She is, however, tolerating a clear liquid diet and has been having regular bowel movements and passing gas. She denies any fevers. She has been attempting to follow a gastroparesis diet. She was advised one week ago during her last ED visit to increase her Reglan to 10 mg QID, however she has been taking only 5 mg QID. In the ED, she was afebrile. UA was unremarkable. A CT abd/pelvis without contrast at [facility] prior to transfer was unremarkable for any acute abnormality, specifically no  evidence of small bowel obstruction.     Past Medical History  Diagnosis Date  . DIABETES MELLITUS, TYPE I, UNCONTROLLED 11/05/2007  . MEDIAL EPICONDYLITIS, LEFT 12/17/2007  . Vision loss   . Renal disorder   . Diabetes mellitus   . Hypertension   . Anxiety   . Gastroparesis    Past Surgical History  Procedure Laterality Date  . Refractive surgery    . Eye surgery    . Av fistula placement  11/17/2011    Procedure: ARTERIOVENOUS (AV) FISTULA CREATION;  Surgeon: Rosetta Posner, MD;  Location: [place];  Service: Vascular;  Laterality: Right;  . Kidney transplant    . Pancrease transplant     Family History  Problem Relation Age of Onset  . Hypertension Father    History  Substance Use Topics  . Smoking status: Former Smoker -- 1 years    Types: Pipe  . Smokeless tobacco: Never Used     Comment: Hooka  . Alcohol Use: No   OB History   Grav Para Term Preterm Abortions TAB SAB Ect Mult Living                 Review of Systems  Constitutional: Negative for fever, chills and unexpected weight change.  HENT: Negative.   Eyes: Negative.   Respiratory: Negative for cough and shortness of breath.   Cardiovascular: Negative for chest pain and leg swelling.  Gastrointestinal: Positive for nausea, vomiting and abdominal pain. Negative for diarrhea, constipation, blood in stool and abdominal distention.  Endocrine: Negative.   Genitourinary: Positive for flank pain. Negative for dysuria, frequency, difficulty  urinating and genital sores.  Musculoskeletal: Negative.   Skin: Negative.   Allergic/Immunologic: Negative.   Neurological: Negative.   Hematological: Negative.   Psychiatric/Behavioral: Negative.   All other systems reviewed and are negative.      Allergies  Shrimp  Home Medications   Current Outpatient Rx  Name  Route  Sig  Dispense  Refill  . aspirin 81 MG tablet   Oral   Take 81 mg by mouth daily.         . cycloSPORINE (SANDIMMUNE) 100 MG capsule    Oral   Take 200 mg by mouth 2 (two) times daily. 275 mg total dose twice daily.         . cycloSPORINE (SANDIMMUNE) 25 MG capsule   Oral   Take 75 mg by mouth 2 (two) times daily. 275 mg total dose twice daily.         Marland Kitchen gabapentin (NEURONTIN) 100 MG capsule   Oral   Take 100 mg by mouth 3 (three) times daily.         . metoCLOPramide (REGLAN) 10 MG tablet   Oral   Take 10 mg by mouth 3 (three) times daily before meals.         . mycophenolate (MYFORTIC) 180 MG EC tablet   Oral   Take 540 mg by mouth 2 (two) times daily.          . ondansetron (ZOFRAN) 8 MG tablet   Oral   Take 1 tablet (8 mg total) by mouth every 8 (eight) hours as needed for nausea or vomiting.   20 tablet   0   . polyethylene glycol (MIRALAX) packet   Oral   Take 17 g by mouth daily.   5 each   0   . promethazine (PHENERGAN) 25 MG tablet   Oral   Take 1 tablet (25 mg total) by mouth every 6 (six) hours as needed for nausea or vomiting.   15 tablet   0    BP 161/85  Pulse 93  Temp(Src) 97.9 F (36.6 C) (Oral)  Resp 16  Wt 117 lb (53.071 kg)  SpO2 99%  LMP 10/20/2013 Physical Exam  Nursing note and vitals reviewed. Constitutional: She is oriented to person, place, and time. She appears well-developed and well-nourished.  HENT:  Head: Normocephalic and atraumatic.  Right Ear: External ear normal.  Left Ear: External ear normal.  Nose: Nose normal.  Mouth/Throat: Oropharynx is clear and moist.  Eyes: Conjunctivae and EOM are normal. Pupils are equal, round, and reactive to light.  Neck: Normal range of motion. Neck supple.  Cardiovascular: Normal rate, regular rhythm and normal heart sounds.   Pulmonary/Chest: Effort normal and breath sounds normal.  Abdominal: Soft. There is tenderness.    Musculoskeletal: Normal range of motion.  Neurological: She is alert and oriented to person, place, and time. She has normal reflexes.  Skin: Skin is warm and dry.  Psychiatric: Her speech  is normal and behavior is normal. Judgment and thought content normal. Her mood appears anxious. Cognition and memory are normal.    ED Course  Procedures (including critical care time) Labs Review Labs Reviewed  CBC WITH DIFFERENTIAL - Abnormal; Notable for the following:    WBC 3.4 (*)    Lymphocytes Relative 9 (*)    Lymphs Abs 0.3 (*)    Monocytes Relative 16 (*)    All other components within normal limits  COMPREHENSIVE METABOLIC PANEL - Abnormal; Notable for the following:  BUN 35 (*)    Creatinine, Ser 1.19 (*)    Alkaline Phosphatase 122 (*)    Total Bilirubin <0.2 (*)    GFR calc non Af Amer 60 (*)    GFR calc Af Amer 70 (*)    All other components within normal limits  URINALYSIS, ROUTINE W REFLEX MICROSCOPIC - Abnormal; Notable for the following:    APPearance CLOUDY (*)    Hgb urine dipstick LARGE (*)    Leukocytes, UA SMALL (*)    All other components within normal limits  URINE MICROSCOPIC-ADD ON - Abnormal; Notable for the following:    Squamous Epithelial / LPF FEW (*)    Bacteria, UA FEW (*)    All other components within normal limits  LIPASE, BLOOD  PREGNANCY, URINE   Imaging Review Ct Abdomen Pelvis Wo Contrast  10/26/2013   CLINICAL DATA:  Pain.  EXAM: CT ABDOMEN AND PELVIS WITHOUT CONTRAST  TECHNIQUE: Multidetector CT imaging of the abdomen and pelvis was performed following the standard protocol without intravenous contrast.  COMPARISON:  DG ABD ACUTE W/CHEST dated 10/24/2013; CT ABD/PELV WO CM dated 10/14/2013; CT ABD/PELVIS W CM dated 04/22/2010  FINDINGS: Liver normal. Spleen normal. Pancreas unremarkable. Gallbladder nondistended. No biliary distention.  Adrenals normal. Atrophic native kidneys. Right lower pelvic kidney containing a double-J ureteral stent. The double-J ureteral stent is in the right renal pelvis and bladder in good position. Bladder is nondistended. Uterus and adnexae are unremarkable.  No significant adenopathy.  Abdominal aorta normal  in caliber.  Large amount of stool is noted colon. Constipation could present in this fashion. Bowel is not opacified. No evidence of bowel obstruction. Debris and pill fragments noted the stomach. Stomach is slightly distended. No free air. No abnormal intra-abdominal fluid collections.  Lung bases are clear. Heart size normal. Abdominal wall intact. No acute bony abnormality. Stable sclerotic density and lucency and thoracolumbar spine. 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  IMPRESSION: 1.  No acute abnormality.  2. Bilateral renal atrophy with right pelvic renal transplant. Double-J ureteral stent in good position and transplanted kidney with tip in the renal pelvis and bladder. No significant hydronephrosis.  3. Large amount of stool in colon.  Constipation cannot be excluded.   Electronically Signed   By: Marcello Moores  Register   On: 10/26/2013 01:03   Dg Abd Acute W/chest  10/24/2013   CLINICAL DATA:  Pain .  EXAM: ACUTE ABDOMEN SERIES (ABDOMEN 2 VIEW & CHEST 1 VIEW)  COMPARISON:  Abdominal series 2/18/ 2015, CT abdomen 10/14/2013 and 04/22/2010.  FINDINGS: No acute cardiopulmonary disease.  Soft tissues the abdomen unremarkable. Large amount of stool is noted throughout the colon suggesting constipation. Surgical sutures in the mid abdomen. A ureteral stent is noted projected approximately of the right iliac fossa and distally of the bladder. This is unchanged in position. Tiny sclerotic densities in pelvis most likely bone islands, these are unchanged.  IMPRESSION: 1. No acute cardiopulmonary disease. 2. Constipation.  No evidence of bowel obstruction. 3. Surgical sutures over mid abdomen. Ureteral stent projected over the right pelvis, unchanged in position .   Electronically Signed   By: Marcello Moores  Register   On: 10/24/2013 21:29     MDM   Final diagnoses:  Chronic abdominal pain   I have discussed with patient that narcotics will not be helpful in the setting of gastroparesis.   She has normal lab values and will be discharged to follow up with her transplant doctors this week  if ct shows no acute abnormalities.   CT with no acute abnormalities.  Patient discharged.     Shaune Pollack, MD 10/26/13 [phone]

## 2013-10-25 NOTE — ED Notes (Signed)
Pt c/o LUQ pain with n/v. Pt has been seen multiple times for same recently. States she needs to go back to her clinic in [place]

## 2013-10-25 NOTE — Discharge Instructions (Signed)
Please recheck with your doctor's at [facility] on Monday.    Abdominal Pain, Adult Many things can cause abdominal pain. Usually, abdominal pain is not caused by a disease and will improve without treatment. It can often be observed and treated at home. Your health care provider will do a physical exam and possibly order blood tests and X-rays to help determine the seriousness of your pain. However, in many cases, more time must pass before a clear cause of the pain can be found. Before that point, your health care provider may not know if you need more testing or further treatment. HOME CARE INSTRUCTIONS  Monitor your abdominal pain for any changes. The following actions may help to alleviate any discomfort you are experiencing:  Only take over-the-counter or prescription medicines as directed by your health care provider.  Do not take laxatives unless directed to do so by your health care provider.  Try a clear liquid diet (broth, tea, or water) as directed by your health care provider. Slowly move to a bland diet as tolerated. SEEK MEDICAL CARE IF:  You have unexplained abdominal pain.  You have abdominal pain associated with nausea or diarrhea.  You have pain when you urinate or have a bowel movement.  You experience abdominal pain that wakes you in the night.  You have abdominal pain that is worsened or improved by eating food.  You have abdominal pain that is worsened with eating fatty foods. SEEK IMMEDIATE MEDICAL CARE IF:   Your pain does not go away within 2 hours.  You have a fever.  You keep throwing up (vomiting).  Your pain is felt only in portions of the abdomen, such as the right side or the left lower portion of the abdomen.  You pass bloody or black tarry stools. MAKE SURE YOU:  Understand these instructions.   Will watch your condition.   Will get help right away if you are not doing well or get worse.  Document Released: 05/31/2005 Document Revised:  06/11/2013 Document Reviewed: 04/30/2013 [facility] Patient Information 2014 [place].

## 2013-10-25 NOTE — Discharge Instructions (Signed)
Call the transplant clinic at [facility] on Monday, 10/27/2013 to schedule the next available appointment. Keep your scheduled appointment with the pain clinic. Get your blood pressure recheck within the next one or 2 weeks. Case was elevated at 159/84

## 2013-10-26 IMAGING — CT CT ABD-PELV W/O CM
1 of 2 series · 15 of 32 positions shown, 19 images · non-contrast
Comparison: DG ABD ACUTE W/CHEST dated [DATE]; CT ABD/PELV WO CM
dated [DATE]; CT ABD/PELVIS W CM dated [DATE]

CLINICAL DATA: Pain.

EXAM:
CT ABDOMEN AND PELVIS WITHOUT CONTRAST
TECHNIQUE: Multidetector CT imaging of the abdomen and pelvis was performed
following the standard protocol without intravenous contrast.

[Series 2: abd/pel w/o · axial · non-contrast · 0.74mm/px · z∈[-491,-71]mm · 15 of 92 slices shown, 19 images]
[im 4/92  soft-tissue]
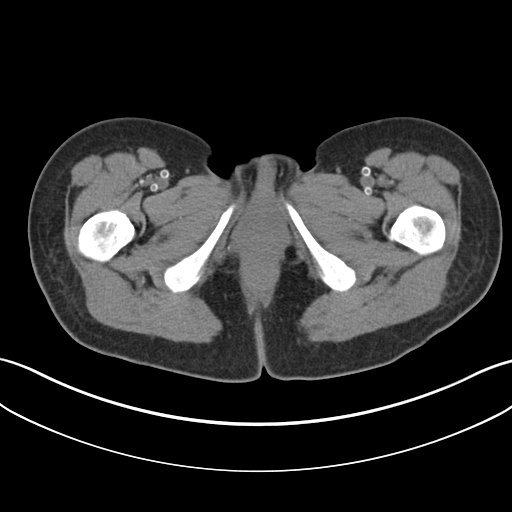
[im 4/92  bone]
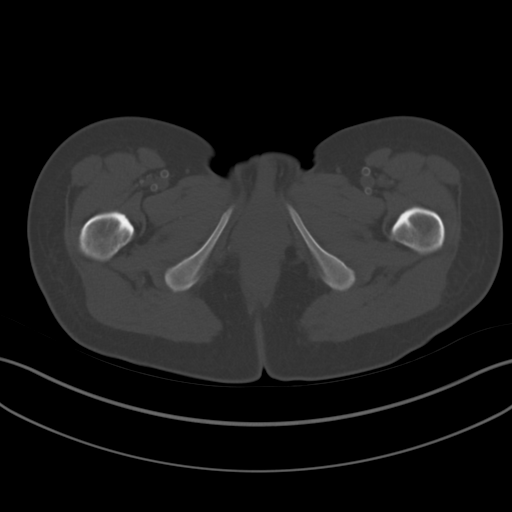
[im 12/92  soft-tissue]
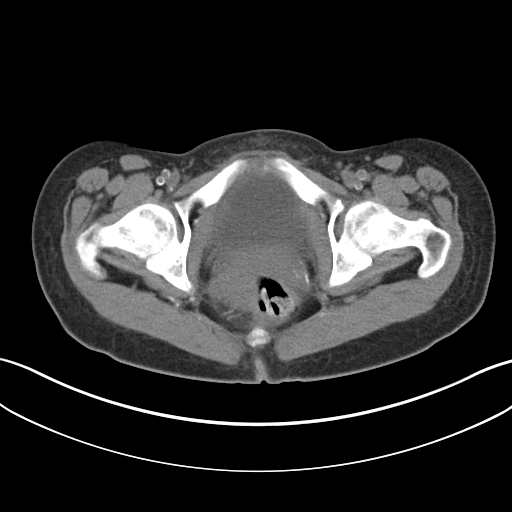
[im 19/92  soft-tissue]
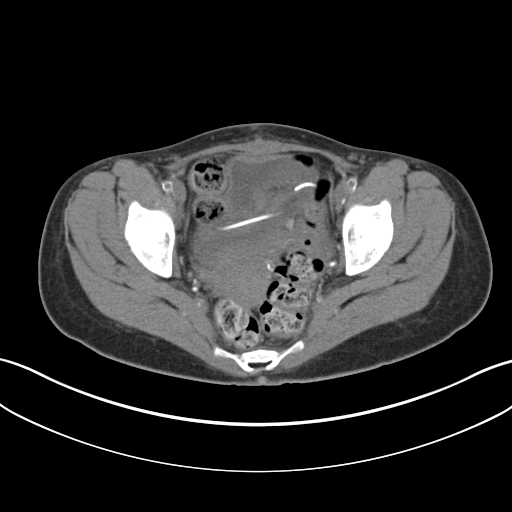
[im 27/92  soft-tissue]
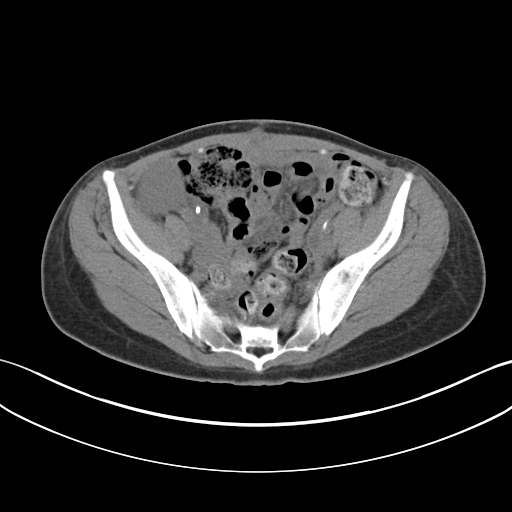
[im 31/92  soft-tissue]
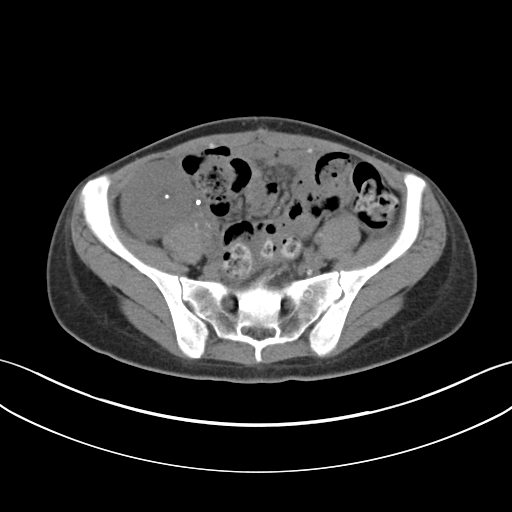
[im 38/92  soft-tissue]
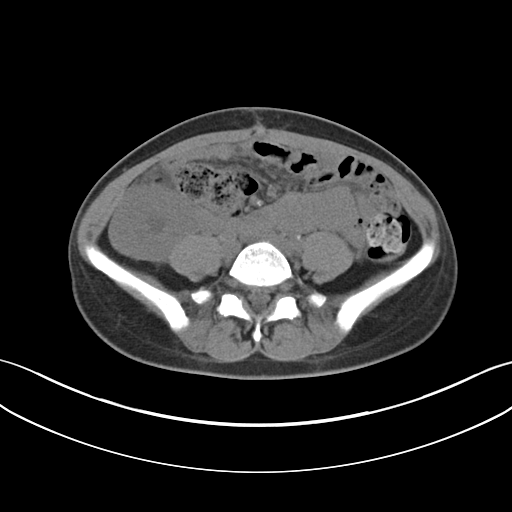
[im 46/92  soft-tissue]
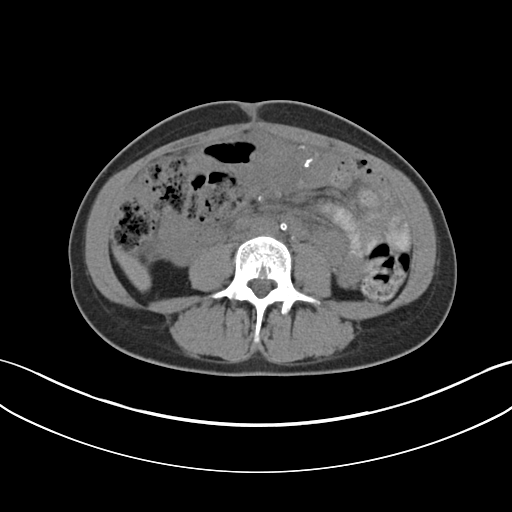
[im 54/92  soft-tissue]
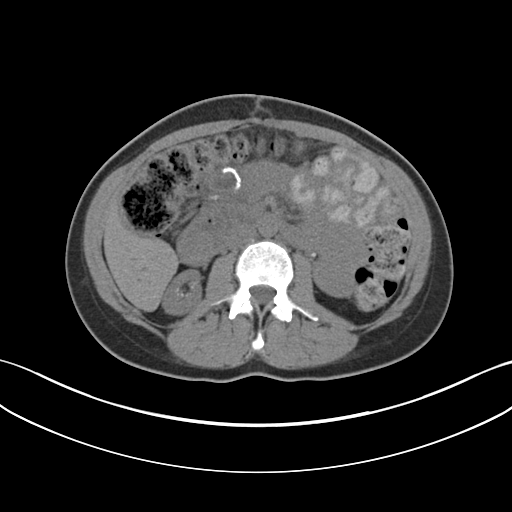
[im 61/92  soft-tissue]
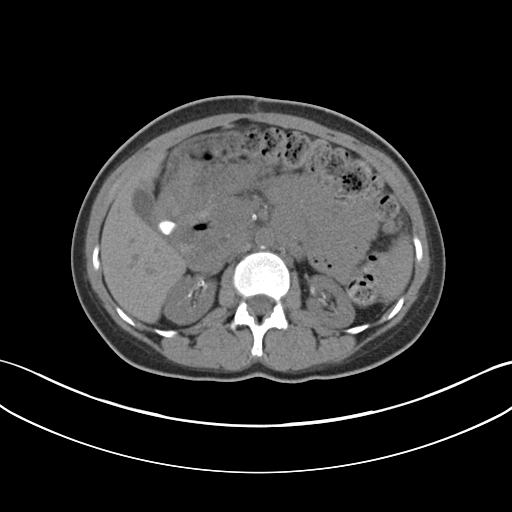
[im 61/92  bone]
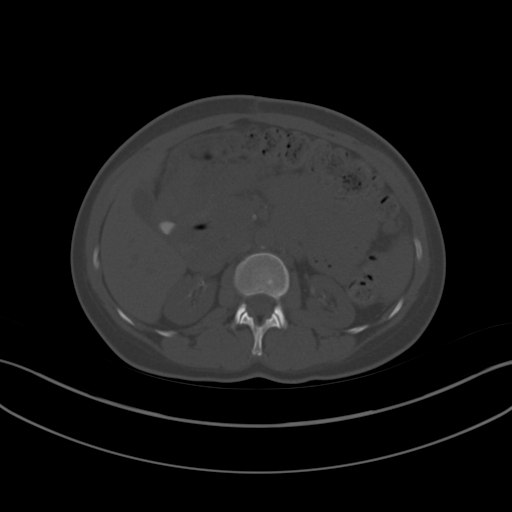
[im 65/92  soft-tissue]
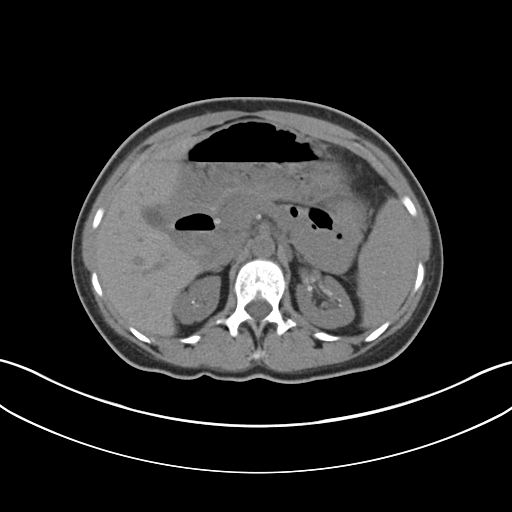
[im 73/92  soft-tissue]
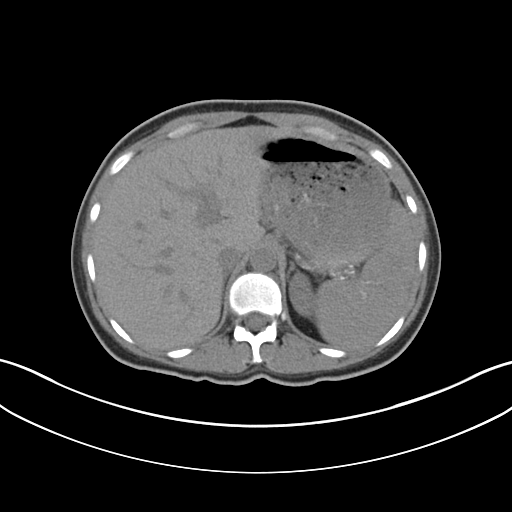
[im 76/92  lung]
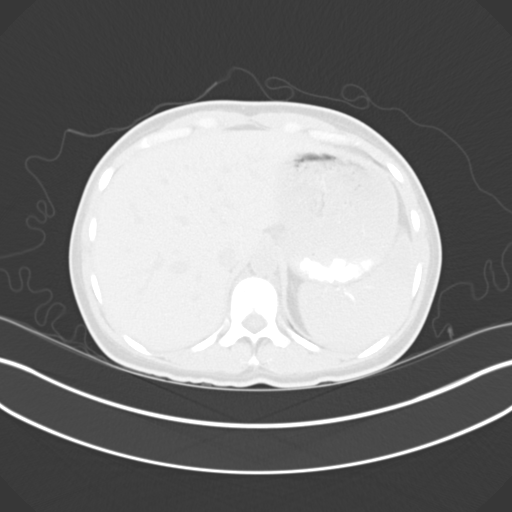
[im 80/92  soft-tissue]
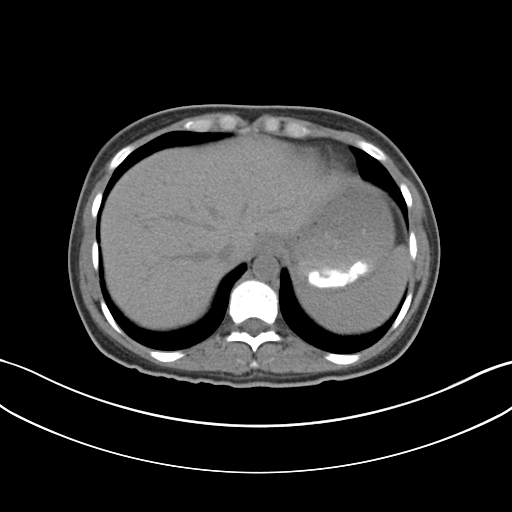
[im 80/92  lung]
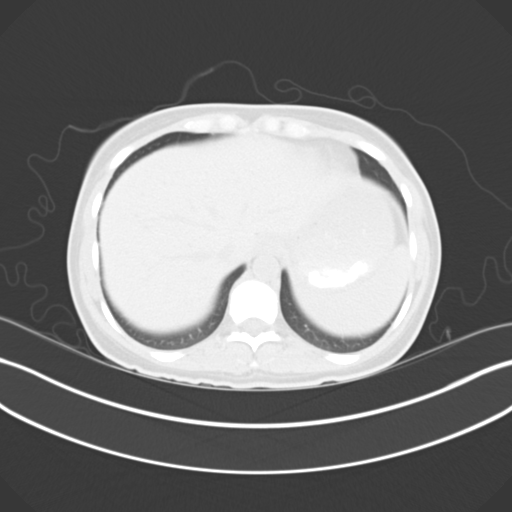
[im 84/92  lung]
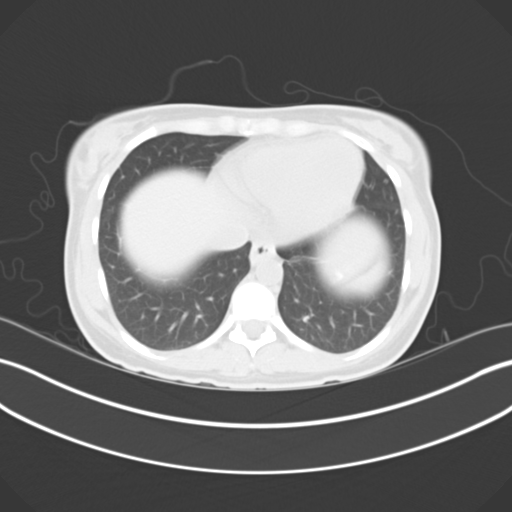
[im 88/92  soft-tissue]
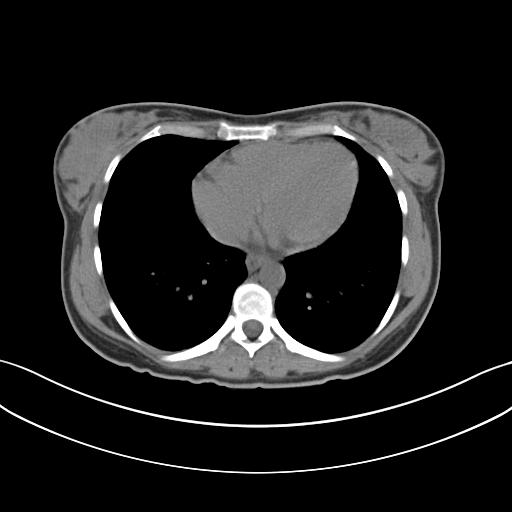
[im 88/92  lung]
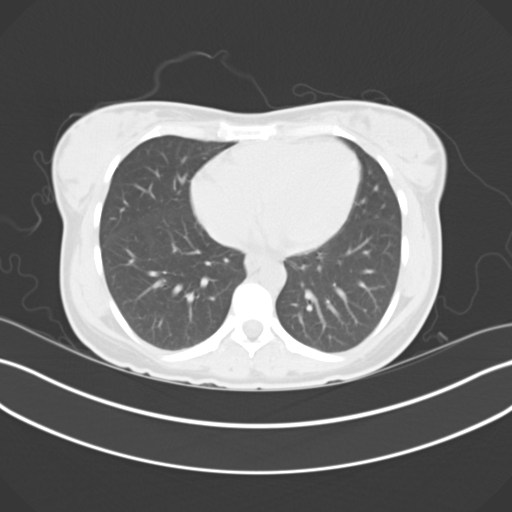

[15 of 32 positions shown; findings below may reference images not displayed]

FINDINGS: Liver normal. Spleen normal. Pancreas unremarkable. Gallbladder
nondistended. No biliary distention.

Adrenals normal. Atrophic native kidneys. Right lower pelvic kidney
containing a double-J ureteral stent. The double-J ureteral stent is
in the right renal pelvis and bladder in good position. Bladder is
nondistended. Uterus and adnexae are unremarkable.

No significant adenopathy.  Abdominal aorta normal in caliber.

Large amount of stool is noted colon. Constipation could present in
this fashion. Bowel is not opacified. No evidence of bowel
obstruction. Debris and pill fragments noted the stomach. Stomach is
slightly distended. No free air. No abnormal intra-abdominal fluid
collections.

Lung bases are clear. Heart size normal. Abdominal wall intact. No
acute bony abnormality. Stable sclerotic density and lucency and
thoracolumbar spine. The bones are slightly dense suggesting renal
osteodystrophy.
IMPRESSION: 1.  No acute abnormality.

2. Bilateral renal atrophy with right pelvic renal transplant.
Double-J ureteral stent in good position and transplanted kidney
with tip in the renal pelvis and bladder. No significant
hydronephrosis.

3. Large amount of stool in colon.  Constipation cannot be excluded.

## 2013-10-26 NOTE — ED Notes (Signed)
Patient transported to CT 

## 2013-10-28 ENCOUNTER — Encounter (HOSPITAL_COMMUNITY): Payer: Self-pay | Admitting: Emergency Medicine

## 2013-10-28 ENCOUNTER — Emergency Department (HOSPITAL_COMMUNITY)
Admission: EM | Admit: 2013-10-28 | Discharge: 2013-10-29 | Disposition: A | Discharge status: Home / Self Care | Payer: Medicare Other | Attending: Emergency Medicine | Admitting: Emergency Medicine

## 2013-10-28 DIAGNOSIS — Z8739 Personal history of other diseases of the musculoskeletal system and connective tissue: Secondary | ICD-10-CM | POA: Insufficient documentation

## 2013-10-28 DIAGNOSIS — R42 Dizziness and giddiness: Secondary | ICD-10-CM | POA: Insufficient documentation

## 2013-10-28 DIAGNOSIS — Z87891 Personal history of nicotine dependence: Secondary | ICD-10-CM | POA: Insufficient documentation

## 2013-10-28 DIAGNOSIS — G8929 Other chronic pain: Secondary | ICD-10-CM

## 2013-10-28 DIAGNOSIS — R509 Fever, unspecified: Secondary | ICD-10-CM | POA: Insufficient documentation

## 2013-10-28 DIAGNOSIS — R0602 Shortness of breath: Secondary | ICD-10-CM | POA: Insufficient documentation

## 2013-10-28 DIAGNOSIS — R109 Unspecified abdominal pain: Secondary | ICD-10-CM | POA: Insufficient documentation

## 2013-10-28 DIAGNOSIS — F411 Generalized anxiety disorder: Secondary | ICD-10-CM | POA: Insufficient documentation

## 2013-10-28 DIAGNOSIS — H9190 Unspecified hearing loss, unspecified ear: Secondary | ICD-10-CM | POA: Insufficient documentation

## 2013-10-28 DIAGNOSIS — I10 Essential (primary) hypertension: Secondary | ICD-10-CM | POA: Insufficient documentation

## 2013-10-28 DIAGNOSIS — Z7982 Long term (current) use of aspirin: Secondary | ICD-10-CM | POA: Insufficient documentation

## 2013-10-28 DIAGNOSIS — IMO0002 Reserved for concepts with insufficient information to code with codable children: Secondary | ICD-10-CM | POA: Insufficient documentation

## 2013-10-28 DIAGNOSIS — R112 Nausea with vomiting, unspecified: Secondary | ICD-10-CM | POA: Insufficient documentation

## 2013-10-28 DIAGNOSIS — Z3202 Encounter for pregnancy test, result negative: Secondary | ICD-10-CM | POA: Insufficient documentation

## 2013-10-28 DIAGNOSIS — E1065 Type 1 diabetes mellitus with hyperglycemia: Secondary | ICD-10-CM | POA: Insufficient documentation

## 2013-10-28 DIAGNOSIS — N289 Disorder of kidney and ureter, unspecified: Secondary | ICD-10-CM | POA: Insufficient documentation

## 2013-10-28 LAB — CBC WITH DIFFERENTIAL/PLATELET
BASOS PCT: 0 % (ref 0–1)
Basophils Absolute: 0 10*3/uL (ref 0.0–0.1)
EOS ABS: 0.1 10*3/uL (ref 0.0–0.7)
EOS PCT: 2 % (ref 0–5)
HEMATOCRIT: 36.1 % (ref 36.0–46.0)
Hemoglobin: 12.2 g/dL (ref 12.0–15.0)
Lymphocytes Relative: 8 % — ABNORMAL LOW (ref 12–46)
Lymphs Abs: 0.3 10*3/uL — ABNORMAL LOW (ref 0.7–4.0)
MCH: 30.3 pg (ref 26.0–34.0)
MCHC: 33.8 g/dL (ref 30.0–36.0)
MCV: 89.6 fL (ref 78.0–100.0)
MONO ABS: 0.3 10*3/uL (ref 0.1–1.0)
MONOS PCT: 6 % (ref 3–12)
Neutro Abs: 3.4 10*3/uL (ref 1.7–7.7)
Neutrophils Relative %: 84 % — ABNORMAL HIGH (ref 43–77)
Platelets: 176 10*3/uL (ref 150–400)
RBC: 4.03 MIL/uL (ref 3.87–5.11)
RDW: 14.7 % (ref 11.5–15.5)
WBC: 4.1 10*3/uL (ref 4.0–10.5)

## 2013-10-28 LAB — COMPREHENSIVE METABOLIC PANEL
ALT: 12 U/L (ref 0–35)
AST: 15 U/L (ref 0–37)
Albumin: 3.6 g/dL (ref 3.5–5.2)
Alkaline Phosphatase: 119 U/L — ABNORMAL HIGH (ref 39–117)
BILIRUBIN TOTAL: 0.2 mg/dL — AB (ref 0.3–1.2)
BUN: 32 mg/dL — AB (ref 6–23)
CALCIUM: 9.9 mg/dL (ref 8.4–10.5)
CO2: 20 mEq/L (ref 19–32)
Chloride: 105 mEq/L (ref 96–112)
Creatinine, Ser: 1.12 mg/dL — ABNORMAL HIGH (ref 0.50–1.10)
GFR calc Af Amer: 75 mL/min — ABNORMAL LOW (ref >90)
GFR calc non Af Amer: 65 mL/min — ABNORMAL LOW (ref >90)
Glucose, Bld: 98 mg/dL (ref 70–99)
Potassium: 5.3 mEq/L (ref 3.7–5.3)
Sodium: 139 mEq/L (ref 137–147)
TOTAL PROTEIN: 7.8 g/dL (ref 6.0–8.3)

## 2013-10-28 LAB — URINALYSIS, ROUTINE W REFLEX MICROSCOPIC
BILIRUBIN URINE: NEGATIVE
Glucose, UA: NEGATIVE mg/dL
Ketones, ur: NEGATIVE mg/dL
NITRITE: NEGATIVE
Protein, ur: NEGATIVE mg/dL
Specific Gravity, Urine: 1.018 (ref 1.005–1.030)
UROBILINOGEN UA: 0.2 mg/dL (ref 0.0–1.0)
pH: 5 (ref 5.0–8.0)

## 2013-10-28 LAB — LIPASE, BLOOD: LIPASE: 32 U/L (ref 11–59)

## 2013-10-28 LAB — URINE MICROSCOPIC-ADD ON

## 2013-10-28 LAB — POC URINE PREG, ED: Preg Test, Ur: NEGATIVE

## 2013-10-28 MED ORDER — PROMETHAZINE HCL 25 MG/ML IJ SOLN
25.0000 mg | INTRAMUSCULAR | Status: AC
  Administered 2013-10-28: 25 mg via INTRAVENOUS
  Filled 2013-10-28: qty 1

## 2013-10-28 MED ORDER — SODIUM CHLORIDE 0.9 % IV BOLUS (SEPSIS)
1000.0000 mL | Freq: Once | INTRAVENOUS | Status: AC
  Administered 2013-10-28: 1000 mL via INTRAVENOUS

## 2013-10-28 MED ORDER — METOCLOPRAMIDE HCL 5 MG/ML IJ SOLN: 10.0000 mg | Freq: Once | INTRAMUSCULAR | Status: DC

## 2013-10-28 MED ORDER — HYDROMORPHONE HCL PF 1 MG/ML IJ SOLN
1.0000 mg | Freq: Once | INTRAMUSCULAR | Status: AC
  Administered 2013-10-28: 1 mg via INTRAVENOUS
  Filled 2013-10-28: qty 1

## 2013-10-28 NOTE — ED Notes (Signed)
Pt BIB [facility], states that she was seen here for abdominal pain previously, now having acute onset pain x2 hours ago.

## 2013-10-28 NOTE — ED Notes (Addendum)
Pt reports LLQ abdominal pain, states she was told that her gastroparesis is causing urinary retention. Pt states she does not have any difficulty urinating at this time. Pt reports x2 episodes of emesis today. Pt a&O x4, NAD noted at this time.

## 2013-10-28 NOTE — ED Provider Notes (Signed)
CSN: [id number]     Arrival date & time 10/28/13  2001 History   First MD Initiated Contact with Patient 10/28/13 2219     Chief Complaint  Patient presents with  . Abdominal Pain     (Consider location/radiation/quality/duration/timing/severity/associated sxs/prior Treatment) Patient is a 33 y.o. female presenting with abdominal pain.  Abdominal Pain Associated symptoms: fever (101 at home) and shortness of breath (secondary to pain)    33 yo female presents with LUQ pain that started 5pm today with gradual onset. Patient describes pain as sharp intermittent pain that is rated at 10/10. Pain does not radiate. Patient admits to N/V x 3 episodes. Denies any diarrhea or constipation. Denies hematemesis. PMH significant for Kidney and pancreas transplant in 07/2013. Hx of T1DM, resolved with pancreas transplant Admits to HTN in past but no longer takes medication for it. Patient admits to Right sided "kidney stent", patient has appointment to have removed on Friday this week at [facility]. Patient has been seen in ED multiple times for abdominal pain. Patient has had multiple imaging studies performed without any obvious cause for pain.  Past Medical History  Diagnosis Date  . DIABETES MELLITUS, TYPE I, UNCONTROLLED 11/05/2007  . MEDIAL EPICONDYLITIS, LEFT 12/17/2007  . Vision loss   . Renal disorder   . Diabetes mellitus   . Hypertension   . Anxiety   . Gastroparesis    Past Surgical History  Procedure Laterality Date  . Refractive surgery    . Eye surgery    . Av fistula placement  11/17/2011    Procedure: ARTERIOVENOUS (AV) FISTULA CREATION;  Surgeon: Rosetta Posner, MD;  Location: [place];  Service: Vascular;  Laterality: Right;  . Kidney transplant    . Pancrease transplant     Family History  Problem Relation Age of Onset  . Hypertension Father    History  Substance Use Topics  . Smoking status: Former Smoker -- 1 years    Types: Pipe  . Smokeless tobacco: Never Used     Comment:  Hooka  . Alcohol Use: No   OB History   Grav Para Term Preterm Abortions TAB SAB Ect Mult Living                 Review of Systems  Constitutional: Positive for fever (101 at home).  Respiratory: Positive for shortness of breath (secondary to pain).   Gastrointestinal: Positive for abdominal pain.  Neurological: Positive for dizziness (when the pain gets bad).  Psychiatric/Behavioral: The patient is nervous/anxious.   All other systems reviewed and are negative.      Allergies  Shrimp  Home Medications   Current Outpatient Rx  Name  Route  Sig  Dispense  Refill  . aspirin 81 MG tablet   Oral   Take 81 mg by mouth daily.         . cycloSPORINE (SANDIMMUNE) 100 MG capsule   Oral   Take 200 mg by mouth 2 (two) times daily. 275 mg total dose twice daily.         . cycloSPORINE (SANDIMMUNE) 25 MG capsule   Oral   Take 75 mg by mouth 2 (two) times daily. 275 mg total dose twice daily.         Marland Kitchen gabapentin (NEURONTIN) 100 MG capsule   Oral   Take 100 mg by mouth 3 (three) times daily.         . metoCLOPramide (REGLAN) 10 MG tablet   Oral   Take  10 mg by mouth 3 (three) times daily before meals.         . mycophenolate (MYFORTIC) 180 MG EC tablet   Oral   Take 540 mg by mouth 2 (two) times daily.          . ondansetron (ZOFRAN) 8 MG tablet   Oral   Take 1 tablet (8 mg total) by mouth every 8 (eight) hours as needed for nausea or vomiting.   20 tablet   0   . polyethylene glycol (MIRALAX) packet   Oral   Take 17 g by mouth daily.   5 each   0   . promethazine (PHENERGAN) 25 MG tablet   Oral   Take 1 tablet (25 mg total) by mouth every 6 (six) hours as needed for nausea or vomiting.   15 tablet   0   . promethazine (PHENERGAN) 25 MG tablet   Oral   Take 1 tablet (25 mg total) by mouth every 6 (six) hours as needed for nausea or vomiting.   12 tablet   0    BP 169/88  Pulse 87  Temp(Src) 97.9 F (36.6 C) (Oral)  Resp 19  SpO2 100%   LMP 10/20/2013 Physical Exam  Nursing note and vitals reviewed. Constitutional: She is oriented to person, place, and time. She appears well-developed and well-nourished. No distress.  HENT:  Head: Normocephalic and atraumatic.  Eyes: Conjunctivae and EOM are normal. No scleral icterus.  Neck: No JVD present. No tracheal deviation present.  Cardiovascular: Normal rate and regular rhythm.  Exam reveals no gallop and no friction rub.   No murmur heard. Pulmonary/Chest: Effort normal and breath sounds normal. No respiratory distress. She has no wheezes. She has no rhonchi. She has no rales.  Abdominal: Soft. Bowel sounds are normal. She exhibits no distension. There is no hepatosplenomegaly. There is tenderness. There is no rigidity, no rebound, no guarding, no CVA tenderness, no tenderness at McBurney's point and negative Murphy's sign.    Musculoskeletal: Normal range of motion. She exhibits no edema.  Neurological: She is alert and oriented to person, place, and time.  Skin: Skin is warm and dry. No rash noted. She is not diaphoretic.  Psychiatric: She has a normal mood and affect. Her behavior is normal.    ED Course  Procedures (including critical care time) Labs Review Labs Reviewed  CBC WITH DIFFERENTIAL - Abnormal; Notable for the following:    Neutrophils Relative % 84 (*)    Lymphocytes Relative 8 (*)    Lymphs Abs 0.3 (*)    All other components within normal limits  COMPREHENSIVE METABOLIC PANEL - Abnormal; Notable for the following:    BUN 32 (*)    Creatinine, Ser 1.12 (*)    Alkaline Phosphatase 119 (*)    Total Bilirubin 0.2 (*)    GFR calc non Af Amer 65 (*)    GFR calc Af Amer 75 (*)    All other components within normal limits  URINALYSIS, ROUTINE W REFLEX MICROSCOPIC - Abnormal; Notable for the following:    Hgb urine dipstick LARGE (*)    Leukocytes, UA TRACE (*)    All other components within normal limits  URINE MICROSCOPIC-ADD ON - Abnormal; Notable for  the following:    Squamous Epithelial / LPF FEW (*)    All other components within normal limits  LIPASE, BLOOD  POC URINE PREG, ED  POC URINE PREG, ED   Imaging Review No results found.  EKG  Interpretation   None       MDM   Final diagnoses:  Chronic abdominal pain  Nausea & vomiting    Patient Pain and Nausea controlled in ED. Patient appears in NAD. Patient tolerating POs in ED.  Recent CT abd/pelvis on 10/15/13 shows no acute abnormalities. Large stool burden suggesting constipation.  Acute abd series on 10/22/13 and 10/24/13 shows Moderate to Large stool burden suggestive of constipation with no evidence of obstruction. No evidence of migration of ureteral stent.   Urine Preg negative UA shows Large hemoglobin unchanged from prior, no evidence nephrolithiasis on prior CT. No evidence of UTI Patient asymptomatic for UTI. Suspect related to ureteral stent.  Elevated BUN/Cr, improved from prior. Appears stable.  Normal Transaminase levels Lipase negative No leukocytosis.  Patient afebrile   Abdominal exam reveal soft non rigid abdomen. Vital signs are stable Doubt Pancreatitis, Bowel obstruction, ectopic, AAA, apendicitis, cholecystitis, Pneumonia, MI, Dissection, Splenic rupture, Kidney stones.   Plan to have patient continue her current dose of miralax at home as directed for constipation as this may be contributing to her pain. Will treat patient's nausea. Patient states she sees her Doctor every week, and has "Tramadol" at home for pain. Discussed possible etiology of pain secondary to side effects from immune suppression medications that she is currently taking. Recommend patient address this at her scheduled appointment Friday this week. Return precautions given for intractable N/V, worsening pain, fever/chills, chest pain, SOB, or dizziness. Patient agrees with plan. Discharged in good condition.   Meds given in ED:  Medications  HYDROmorphone (DILAUDID) injection 1  mg (1 mg Intravenous Given 10/28/13 2320)  sodium chloride 0.9 % bolus 1,000 mL (0 mLs Intravenous Stopped 10/29/13 0103)  promethazine (PHENERGAN) injection 25 mg (25 mg Intravenous Given 10/28/13 2341)  sodium chloride 0.9 % bolus 1,000 mL (0 mLs Intravenous Stopped 10/29/13 0221)  fentaNYL (SUBLIMAZE) injection 50 mcg (50 mcg Intravenous Given 10/29/13 0102)    Discharge Medication List as of 10/29/2013  1:25 AM            Sherrie George, PA-C 10/29/13 1526

## 2013-10-29 ENCOUNTER — Emergency Department (HOSPITAL_COMMUNITY)
Admission: EM | Admit: 2013-10-29 | Discharge: 2013-10-29 | Disposition: A | Discharge status: Home / Self Care | Payer: Medicare Other | Source: Home / Self Care | Attending: Emergency Medicine | Admitting: Emergency Medicine

## 2013-10-29 ENCOUNTER — Encounter (HOSPITAL_COMMUNITY): Payer: Self-pay | Admitting: Emergency Medicine

## 2013-10-29 DIAGNOSIS — K3184 Gastroparesis: Secondary | ICD-10-CM

## 2013-10-29 DIAGNOSIS — Z87891 Personal history of nicotine dependence: Secondary | ICD-10-CM

## 2013-10-29 DIAGNOSIS — N186 End stage renal disease: Secondary | ICD-10-CM | POA: Insufficient documentation

## 2013-10-29 DIAGNOSIS — R1012 Left upper quadrant pain: Secondary | ICD-10-CM | POA: Insufficient documentation

## 2013-10-29 DIAGNOSIS — E1065 Type 1 diabetes mellitus with hyperglycemia: Secondary | ICD-10-CM

## 2013-10-29 DIAGNOSIS — Z3202 Encounter for pregnancy test, result negative: Secondary | ICD-10-CM

## 2013-10-29 DIAGNOSIS — Z7982 Long term (current) use of aspirin: Secondary | ICD-10-CM

## 2013-10-29 DIAGNOSIS — Z8739 Personal history of other diseases of the musculoskeletal system and connective tissue: Secondary | ICD-10-CM | POA: Insufficient documentation

## 2013-10-29 DIAGNOSIS — Z9483 Pancreas transplant status: Secondary | ICD-10-CM

## 2013-10-29 DIAGNOSIS — G8929 Other chronic pain: Secondary | ICD-10-CM | POA: Insufficient documentation

## 2013-10-29 DIAGNOSIS — R109 Unspecified abdominal pain: Principal | ICD-10-CM

## 2013-10-29 DIAGNOSIS — Z79899 Other long term (current) drug therapy: Secondary | ICD-10-CM

## 2013-10-29 DIAGNOSIS — I120 Hypertensive chronic kidney disease with stage 5 chronic kidney disease or end stage renal disease: Secondary | ICD-10-CM | POA: Insufficient documentation

## 2013-10-29 DIAGNOSIS — Z940 Kidney transplant status: Secondary | ICD-10-CM | POA: Insufficient documentation

## 2013-10-29 DIAGNOSIS — IMO0002 Reserved for concepts with insufficient information to code with codable children: Secondary | ICD-10-CM | POA: Insufficient documentation

## 2013-10-29 DIAGNOSIS — F411 Generalized anxiety disorder: Secondary | ICD-10-CM

## 2013-10-29 DIAGNOSIS — Z992 Dependence on renal dialysis: Secondary | ICD-10-CM | POA: Insufficient documentation

## 2013-10-29 LAB — URINE MICROSCOPIC-ADD ON

## 2013-10-29 LAB — URINALYSIS, ROUTINE W REFLEX MICROSCOPIC
BILIRUBIN URINE: NEGATIVE
Glucose, UA: NEGATIVE mg/dL
KETONES UR: NEGATIVE mg/dL
Nitrite: NEGATIVE
PH: 5 (ref 5.0–8.0)
Protein, ur: NEGATIVE mg/dL
SPECIFIC GRAVITY, URINE: 1.011 (ref 1.005–1.030)
Urobilinogen, UA: 0.2 mg/dL (ref 0.0–1.0)

## 2013-10-29 LAB — PREGNANCY, URINE: Preg Test, Ur: NEGATIVE

## 2013-10-29 MED ORDER — FENTANYL CITRATE 0.05 MG/ML IJ SOLN
50.0000 ug | Freq: Once | INTRAMUSCULAR | Status: AC
  Administered 2013-10-29: 50 ug via INTRAVENOUS
  Filled 2013-10-29: qty 2

## 2013-10-29 MED ORDER — PROMETHAZINE HCL 25 MG PO TABS: 25.0000 mg | ORAL_TABLET | Freq: Four times a day (QID) | ORAL | Status: DC | PRN

## 2013-10-29 MED ORDER — SODIUM CHLORIDE 0.9 % IV BOLUS (SEPSIS)
1000.0000 mL | Freq: Once | INTRAVENOUS | Status: AC
  Administered 2013-10-29: 1000 mL via INTRAVENOUS

## 2013-10-29 MED ORDER — TRAMADOL HCL 50 MG PO TABS: 50.0000 mg | ORAL_TABLET | Freq: Four times a day (QID) | ORAL | Status: DC | PRN

## 2013-10-29 MED ORDER — TRAMADOL HCL 50 MG PO TABS: 50.0000 mg | ORAL_TABLET | Freq: Once | ORAL | Status: DC

## 2013-10-29 NOTE — ED Notes (Signed)
Pt was seen yesterday for abdominal pain and released. States its worse today and that she has vomited twice. Nausea. Alert and oriented.

## 2013-10-29 NOTE — ED Provider Notes (Signed)
CSN: [id number]     Arrival date & time 10/29/13  [id number] History   First MD Initiated Contact with Patient 10/29/13 2159     Chief Complaint  Patient presents with  . Abdominal Pain     (Consider location/radiation/quality/duration/timing/severity/associated sxs/prior Treatment) HPI Comments: Patient is a 33 year old female past medical history of kidney transplant, pancreatic transplant, hypertension, anxiety, gastroparesis and diabetes who presents to the emergency department for the 7th time in 15 days complaining of continued left upper quadrant abdominal pain since being discharged from the emergency department yesterday. States this is the same pain she always gets, she has had multiple labs, 2 CT scans and abdominal plain films without any acute findings. All of her physicians are at [facility], patient states that EMS personnel would not take her over there tonight. States she has an appointment at [facility] in 2 days to have a kidney stent removed. She has taken tramadol in the past with relief of her pain, however does not have any of this left. Admits to associated nausea with 2 episodes of nonbloody emesis. Denies fever or chills.  Patient is a 33 y.o. female presenting with abdominal pain. The history is provided by the patient.  Abdominal Pain Associated symptoms: nausea and vomiting     Past Medical History  Diagnosis Date  . DIABETES MELLITUS, TYPE I, UNCONTROLLED 11/05/2007  . MEDIAL EPICONDYLITIS, LEFT 12/17/2007  . Vision loss   . Renal disorder   . Diabetes mellitus   . Hypertension   . Anxiety   . Gastroparesis    Past Surgical History  Procedure Laterality Date  . Refractive surgery    . Eye surgery    . Av fistula placement  11/17/2011    Procedure: ARTERIOVENOUS (AV) FISTULA CREATION;  Surgeon: Rosetta Posner, MD;  Location: [place];  Service: Vascular;  Laterality: Right;  . Kidney transplant    . Pancrease transplant     Family History   Problem Relation Age of Onset  . Hypertension Father    History  Substance Use Topics  . Smoking status: Former Smoker -- 1 years    Types: Pipe  . Smokeless tobacco: Never Used     Comment: Hooka  . Alcohol Use: No   OB History   Grav Para Term Preterm Abortions TAB SAB Ect Mult Living                 Review of Systems  Gastrointestinal: Positive for nausea, vomiting and abdominal pain.  All other systems reviewed and are negative.      Allergies  Shrimp  Home Medications   Current Outpatient Rx  Name  Route  Sig  Dispense  Refill  . aspirin 81 MG tablet   Oral   Take 81 mg by mouth daily.         . cycloSPORINE (SANDIMMUNE) 100 MG capsule   Oral   Take 200 mg by mouth 2 (two) times daily. 275 mg total dose twice daily.         . cycloSPORINE (SANDIMMUNE) 25 MG capsule   Oral   Take 75 mg by mouth 2 (two) times daily. 275 mg total dose twice daily.         Marland Kitchen gabapentin (NEURONTIN) 100 MG capsule   Oral   Take 100 mg by mouth 3 (three) times daily.         . metoCLOPramide (REGLAN) 10 MG tablet   Oral   Take 10  mg by mouth 3 (three) times daily before meals.         . mycophenolate (MYFORTIC) 180 MG EC tablet   Oral   Take 540 mg by mouth 2 (two) times daily.          . ondansetron (ZOFRAN) 8 MG tablet   Oral   Take 1 tablet (8 mg total) by mouth every 8 (eight) hours as needed for nausea or vomiting.   20 tablet   0   . polyethylene glycol (MIRALAX) packet   Oral   Take 17 g by mouth daily.   5 each   0   . promethazine (PHENERGAN) 25 MG tablet   Oral   Take 1 tablet (25 mg total) by mouth every 6 (six) hours as needed for nausea or vomiting.   15 tablet   0   . traMADol (ULTRAM) 50 MG tablet   Oral   Take 1 tablet (50 mg total) by mouth every 6 (six) hours as needed.   15 tablet   0    BP 160/86  Pulse 102  Temp(Src) 98.3 F (36.8 C) (Oral)  Resp 16  SpO2 97%  LMP 10/20/2013 Physical Exam  Nursing note and vitals  reviewed. Constitutional: She is oriented to person, place, and time. She appears well-developed and well-nourished. No distress.  HENT:  Head: Normocephalic and atraumatic.  Mouth/Throat: Oropharynx is clear and moist.  Eyes: Conjunctivae are normal.  Neck: Normal range of motion. Neck supple.  Cardiovascular: Normal rate, regular rhythm and normal heart sounds.   Pulmonary/Chest: Effort normal and breath sounds normal.  Abdominal: Soft. Bowel sounds are normal. There is tenderness in the left upper quadrant. There is no rigidity, no rebound and no guarding.  No peritoneal signs.  Musculoskeletal: Normal range of motion. She exhibits no edema.  Neurological: She is alert and oriented to person, place, and time.  Skin: Skin is warm and dry. She is not diaphoretic.  Psychiatric: She has a normal mood and affect. Her behavior is normal.    ED Course  Procedures (including critical care time) Labs Review Labs Reviewed  URINALYSIS, ROUTINE W REFLEX MICROSCOPIC - Abnormal; Notable for the following:    APPearance CLOUDY (*)    Hgb urine dipstick LARGE (*)    Leukocytes, UA TRACE (*)    All other components within normal limits  PREGNANCY, URINE  URINE MICROSCOPIC-ADD ON   Imaging Review No results found.  EKG Interpretation   None       MDM   Final diagnoses:  Chronic abdominal pain  Gastroparesis    Patient presenting with left upper quadrant abdominal pain, 7 visit in the past 15 days for the same. She has an appointment with her doctors at [facility] in 2 days. She was seen yesterday, had labs without any acute findings. She has tenderness in left upper quadrant, no peritoneal signs. She is out of tramadol. I discussed importance of followup with her physicians at [facility] and pain management. I do not feel any further workup is needed at this time. Patient is stable for discharge. Will d/c with tramadol. Return precautions given. Patient states understanding of treatment care  plan and is agreeable.    Illene Labrador, PA-C 10/29/13 2245

## 2013-10-29 NOTE — Discharge Instructions (Signed)
Take tramadol as directed for pain. Follow up with your doctors at [facility].  Abdominal Pain, Adult Many things can cause abdominal pain. Usually, abdominal pain is not caused by a disease and will improve without treatment. It can often be observed and treated at home. Your health care provider will do a physical exam and possibly order blood tests and X-rays to help determine the seriousness of your pain. However, in many cases, more time must pass before a clear cause of the pain can be found. Before that point, your health care provider may not know if you need more testing or further treatment. HOME CARE INSTRUCTIONS  Monitor your abdominal pain for any changes. The following actions may help to alleviate any discomfort you are experiencing:  Only take over-the-counter or prescription medicines as directed by your health care provider.  Do not take laxatives unless directed to do so by your health care provider.  Try a clear liquid diet (broth, tea, or water) as directed by your health care provider. Slowly move to a bland diet as tolerated. SEEK MEDICAL CARE IF:  You have unexplained abdominal pain.  You have abdominal pain associated with nausea or diarrhea.  You have pain when you urinate or have a bowel movement.  You experience abdominal pain that wakes you in the night.  You have abdominal pain that is worsened or improved by eating food.  You have abdominal pain that is worsened with eating fatty foods. SEEK IMMEDIATE MEDICAL CARE IF:   Your pain does not go away within 2 hours.  You have a fever.  You keep throwing up (vomiting).  Your pain is felt only in portions of the abdomen, such as the right side or the left lower portion of the abdomen.  You pass bloody or black tarry stools. MAKE SURE YOU:  Understand these instructions.   Will watch your condition.   Will get help right away if you are not doing well or get worse.   Document Released: 05/31/2005 Document Revised: 06/11/2013 Document Reviewed: 04/30/2013 [facility] Patient Information 2014 [place].  Chronic Pain Chronic pain can be defined as pain that is off and on and lasts for 3 6 months or longer. Many things cause chronic pain, which can make it difficult to make a diagnosis. There are many treatment options available for chronic pain. However, finding a treatment that works well for you may require trying various approaches until the right one is found. Many people benefit from a combination of two or more types of treatment to control their pain. SYMPTOMS  Chronic pain can occur anywhere in the body and can range from mild to very severe. Some types of chronic pain include:  Headache.  Low back pain.  Cancer pain.  Arthritis pain.  Neurogenic pain. This is pain resulting from damage to nerves. People with chronic pain may also have other symptoms such as:  Depression.  Anger.  Insomnia.  Anxiety. DIAGNOSIS  Your health care provider will help diagnose your condition over time. In many cases, the initial focus will be on excluding possible conditions that could be causing the pain. Depending on your symptoms, your health care provider may order tests to diagnose your condition. Some of these tests may include:   Blood tests.   CT scan.   MRI.   X-rays.   Ultrasounds.   Nerve conduction studies.  You may need to see a specialist.  TREATMENT  Finding treatment that works well may  take time. You may be referred to a pain specialist. He or she may prescribe medicine or therapies, such as:   Mindful meditation or yoga.  Shots (injections) of numbing or pain-relieving medicines into the spine or area of pain.  Local electrical stimulation.  Acupuncture.   Massage therapy.   Aroma, color, light, or sound therapy.   Biofeedback.   Working with a physical therapist to keep from getting stiff.    Regular, gentle exercise.   Cognitive or behavioral therapy.   Group support.  Sometimes, surgery may be recommended.  HOME CARE INSTRUCTIONS   Take all medicines as directed by your health care provider.   Lessen stress in your life by relaxing and doing things such as listening to calming music.   Exercise or be active as directed by your health care provider.   Eat a healthy diet and include things such as vegetables, fruits, fish, and lean meats in your diet.   Keep all follow-up appointments with your health care provider.   Attend a support group with others suffering from chronic pain. SEEK MEDICAL CARE IF:   Your pain gets worse.   You develop a new pain that was not there before.   You cannot tolerate medicines given to you by your health care provider.   You have new symptoms since your last visit with your health care provider.  SEEK IMMEDIATE MEDICAL CARE IF:   You feel weak.   You have decreased sensation or numbness.   You lose control of bowel or bladder function.   Your pain suddenly gets much worse.   You develop shaking.  You develop chills.  You develop confusion.  You develop chest pain.  You develop shortness of breath.  MAKE SURE YOU:  Understand these instructions.  Will watch your condition.  Will get help right away if you are not doing well or get worse. Document Released: 05/13/2002 Document Revised: 04/23/2013 Document Reviewed: 02/14/2013 [facility] Patient Information 2014 [place].  Gastroparesis  Gastroparesis is also called slowed stomach emptying (delayed gastric emptying). It is a condition in which the stomach takes too long to empty its contents. It often happens in people with diabetes.  CAUSES  Gastroparesis happens when nerves to the stomach are damaged or stop working. When the nerves are damaged, the muscles of the stomach and intestines do not work normally. The movement of food is slowed  or stopped. High blood glucose (sugar) causes changes in nerves and can damage the blood vessels that carry oxygen and nutrients to the nerves. RISK FACTORS  Diabetes.  Post-viral syndromes.  Eating disorders (anorexia, bulimia).  Surgery on the stomach or vagus nerve.  Gastroesophageal reflux disease (rarely).  Smooth muscle disorders (amyloidosis, scleroderma).  Metabolic disorders, including hypothyroidism.  Parkinson disease. SYMPTOMS   Heartburn.  Feeling sick to your stomach (nausea).  Vomiting of undigested food.  An early feeling of fullness when eating.  Weight loss.  Abdominal bloating.  Erratic blood glucose levels.  Lack of appetite.  Gastroesophageal reflux.  Spasms of the stomach wall. Complications can include:  Bacterial overgrowth in stomach. Food stays in the stomach and can ferment and cause bacteria to grow.  Weight loss due to difficulty digesting and absorbing nutrients.  Vomiting.  Obstruction in the stomach. Undigested food can harden and cause nausea and vomiting.  Blood glucose fluctuations caused by inconsistent food absorption. DIAGNOSIS  The diagnosis of gastroparesis is confirmed through one or more of the following tests:  Barium X-rays  and scans. These tests look at how long it takes for food to move through the stomach.  Gastric manometry. This test measures electrical and muscular activity in the stomach. A thin tube is passed down the throat into the stomach. The tube contains a wire that takes measurements of the stomach's electrical and muscular activity as it digests liquids and solid food.  Endoscopy. This procedure is done with a long, thin tube called an endoscope. It is passed through the mouth and gently down the esophagus into the stomach. This tube helps the caregiver look at the lining of the stomach to check for any abnormalities.  Ultrasonography. This can rule out gallbladder disease or pancreatitis. This test  will outline and define the shape of the gallbladder and pancreas. TREATMENT   Treatments may include:  Exercise.  Medicines to control nausea and vomiting.  Medicines to stimulate stomach muscles.  Changes in what and when you eat.  Having smaller meals more often.  Eating low-fiber forms of high-fiber foods, such as eating cooked vegetables instead of raw vegetables.  Eating low-fat foods.  Consuming liquids, which are easier to digest.  In severe cases, feeding tubes and intravenous (IV) feeding may be needed. It is important to note that in most cases, treatment does not cure gastroparesis. It is usually a lasting (chronic) condition. Treatment helps you manage the underlying condition so that you can be as healthy and comfortable as possible. Other treatments  A gastric neurostimulator has been developed to assist people with gastroparesis. The battery-operated device is surgically implanted. It emits mild electrical pulses to help improve stomach emptying and to control nausea and vomiting.  The use of botulinum toxin has been shown to improve stomach emptying by decreasing the prolonged contractions of the muscle between the stomach and the small intestine (pyloric sphincter). The benefits are temporary. SEEK MEDICAL CARE IF:   You have diabetes and you are having problems keeping your blood glucose in goal range.  You are having nausea, vomiting, bloating, or early feelings of fullness with eating.  Your symptoms do not change with a change in diet. Document Released: 08/21/2005 Document Revised: 12/16/2012 Document Reviewed: 01/28/2009 [facility] Patient Information 2014 [place], [place].

## 2013-10-29 NOTE — Discharge Instructions (Signed)
Follow up with your doctor in 2 days for reevaluation of symptoms. Take medication as directed for nausea. Return to Emergency Department if you develop fever/chills, unable to tolerate fluids by mouth, or worsening abdominal pain.     Abdominal Pain, Adult Many things can cause belly (abdominal) pain. Most times, the belly pain is not dangerous. Many cases of belly pain can be watched and treated at home. HOME CARE   Do not take medicines that help you go poop (laxatives) unless told to by your doctor.  Only take medicine as told by your doctor.  Eat or drink as told by your doctor. Your doctor will tell you if you should be on a special diet. GET HELP IF:  You do not know what is causing your belly pain.  You have belly pain while you are sick to your stomach (nauseous) or have runny poop (diarrhea).  You have pain while you pee or poop.  Your belly pain wakes you up at night.  You have belly pain that gets worse or better when you eat.  You have belly pain that gets worse when you eat fatty foods. GET HELP RIGHT AWAY IF:   The pain does not go away within 2 hours.  You have a fever.  You keep throwing up (vomiting).  The pain changes and is only in the right or left part of the belly.  You have bloody or tarry looking poop. MAKE SURE YOU:   Understand these instructions.  Will watch your condition.  Will get help right away if you are not doing well or get worse. Document Released: 02/07/2008 Document Revised: 06/11/2013 Document Reviewed: 04/30/2013 [facility] Patient Information 2014 [place].

## 2013-10-29 NOTE — ED Notes (Signed)
Patient seen six times this month for same complaint Patient in NAD

## 2013-10-29 NOTE — ED Notes (Signed)
Patient states that she does not want to wait for discharge papers or to be medicated, even though she specifically asked for Ultram Patient states that she is leaving the ED Patient in NAD upon leaving--NO N/V/D

## 2013-10-30 NOTE — ED Provider Notes (Signed)
Medical screening examination/treatment/procedure(s) were performed by non-physician practitioner and as supervising physician I was immediately available for consultation/collaboration.  EKG Interpretation   None         Blanchard Kelch, MD 10/30/13 [phone]

## 2013-10-31 ENCOUNTER — Emergency Department (HOSPITAL_BASED_OUTPATIENT_CLINIC_OR_DEPARTMENT_OTHER)
Admission: EM | Admit: 2013-10-31 | Discharge: 2013-11-01 | Disposition: A | Discharge status: Home / Self Care | Payer: Medicare Other | Attending: Emergency Medicine | Admitting: Emergency Medicine

## 2013-10-31 ENCOUNTER — Encounter (HOSPITAL_BASED_OUTPATIENT_CLINIC_OR_DEPARTMENT_OTHER): Payer: Self-pay | Admitting: Emergency Medicine

## 2013-10-31 ENCOUNTER — Emergency Department (HOSPITAL_BASED_OUTPATIENT_CLINIC_OR_DEPARTMENT_OTHER): Payer: Medicare Other

## 2013-10-31 DIAGNOSIS — Z9889 Other specified postprocedural states: Secondary | ICD-10-CM | POA: Insufficient documentation

## 2013-10-31 DIAGNOSIS — Z940 Kidney transplant status: Secondary | ICD-10-CM | POA: Insufficient documentation

## 2013-10-31 DIAGNOSIS — I10 Essential (primary) hypertension: Secondary | ICD-10-CM | POA: Insufficient documentation

## 2013-10-31 DIAGNOSIS — K3184 Gastroparesis: Secondary | ICD-10-CM | POA: Insufficient documentation

## 2013-10-31 DIAGNOSIS — E1065 Type 1 diabetes mellitus with hyperglycemia: Secondary | ICD-10-CM | POA: Insufficient documentation

## 2013-10-31 DIAGNOSIS — F411 Generalized anxiety disorder: Secondary | ICD-10-CM | POA: Insufficient documentation

## 2013-10-31 DIAGNOSIS — Z9483 Pancreas transplant status: Secondary | ICD-10-CM | POA: Insufficient documentation

## 2013-10-31 DIAGNOSIS — Z79899 Other long term (current) drug therapy: Secondary | ICD-10-CM | POA: Insufficient documentation

## 2013-10-31 DIAGNOSIS — N390 Urinary tract infection, site not specified: Secondary | ICD-10-CM | POA: Insufficient documentation

## 2013-10-31 DIAGNOSIS — H547 Unspecified visual loss: Secondary | ICD-10-CM | POA: Insufficient documentation

## 2013-10-31 DIAGNOSIS — Z792 Long term (current) use of antibiotics: Secondary | ICD-10-CM | POA: Insufficient documentation

## 2013-10-31 DIAGNOSIS — Z87891 Personal history of nicotine dependence: Secondary | ICD-10-CM | POA: Insufficient documentation

## 2013-10-31 DIAGNOSIS — G8929 Other chronic pain: Secondary | ICD-10-CM | POA: Insufficient documentation

## 2013-10-31 DIAGNOSIS — IMO0002 Reserved for concepts with insufficient information to code with codable children: Secondary | ICD-10-CM | POA: Insufficient documentation

## 2013-10-31 DIAGNOSIS — Z7982 Long term (current) use of aspirin: Secondary | ICD-10-CM | POA: Insufficient documentation

## 2013-10-31 IMAGING — CR DG ABDOMEN ACUTE W/ 1V CHEST
3 series · 3 of 3 positions shown · non-contrast
Comparison: [HOSPITAL] CT abdomen pelvis dated [DATE]

CLINICAL DATA: Progressive left upper quadrant abdominal pain

EXAM:
ACUTE ABDOMEN SERIES (ABDOMEN 2 VIEW & CHEST 1 VIEW)

[w chest pa]
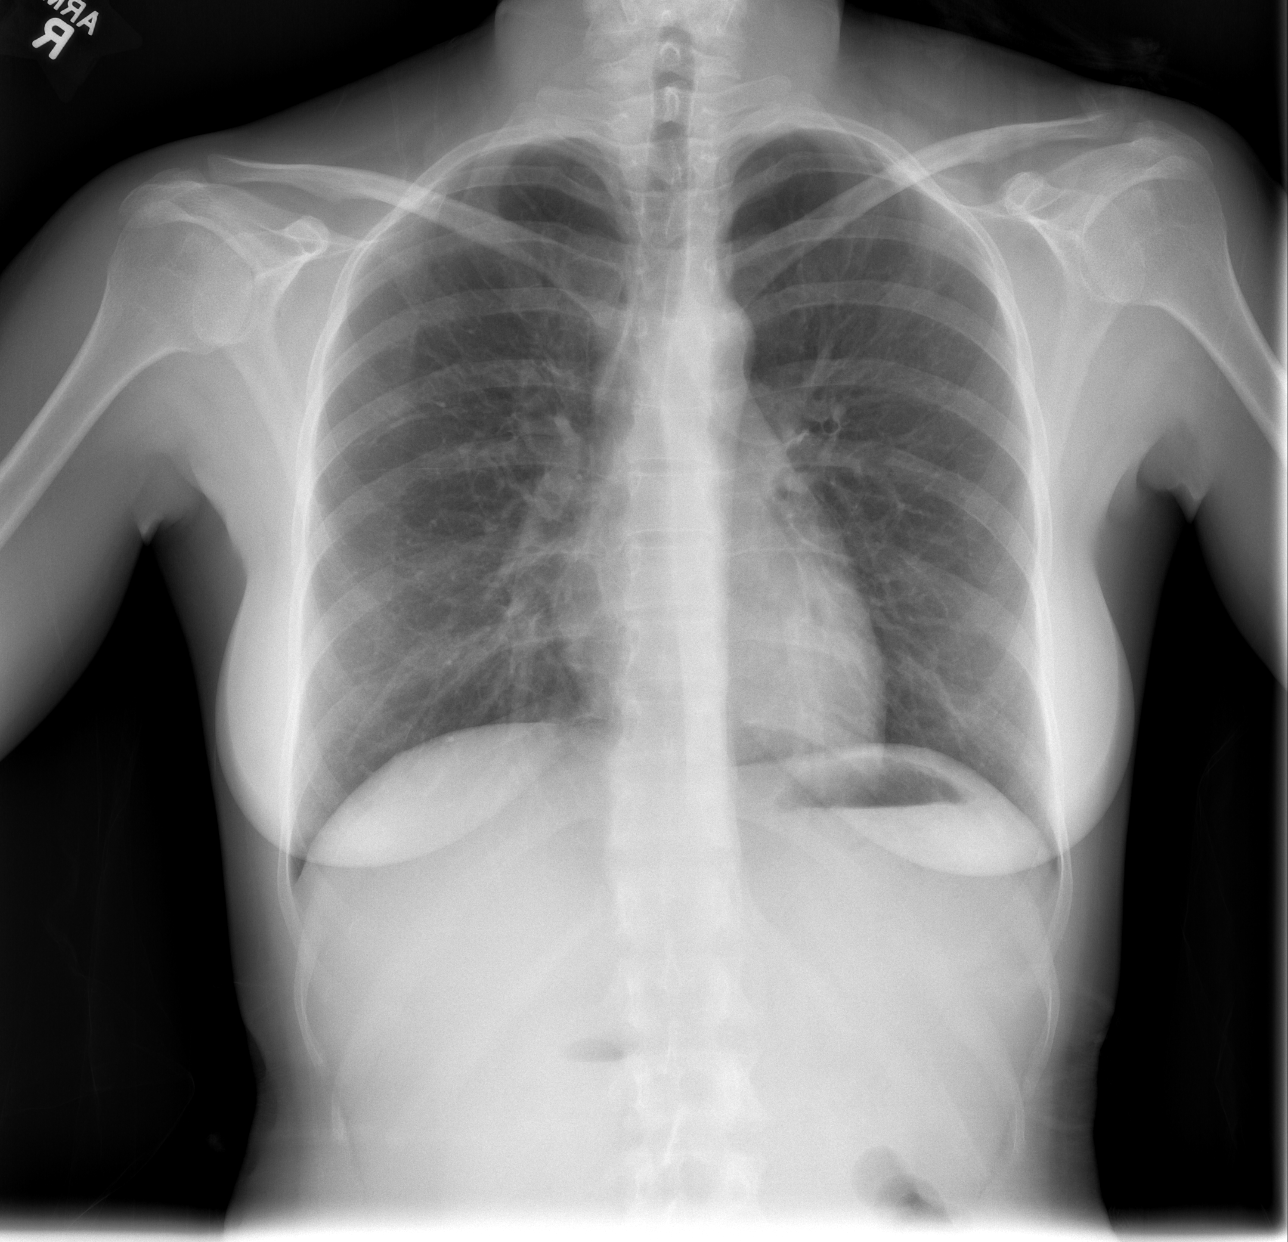

[w abdomen upright]
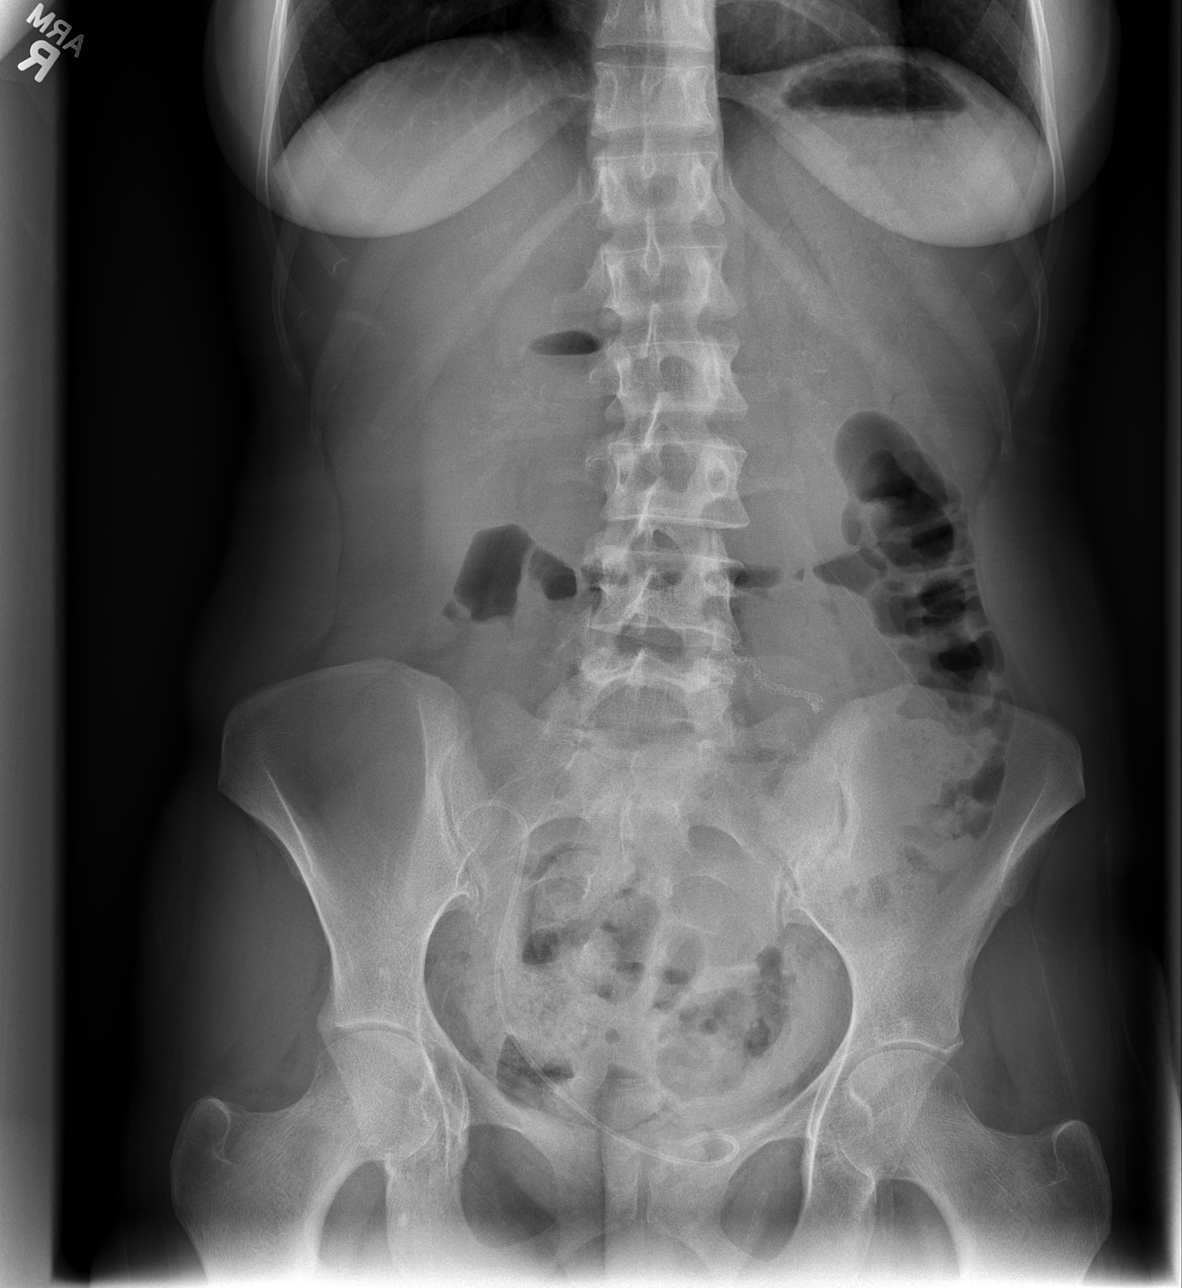

[t abdomen supine]
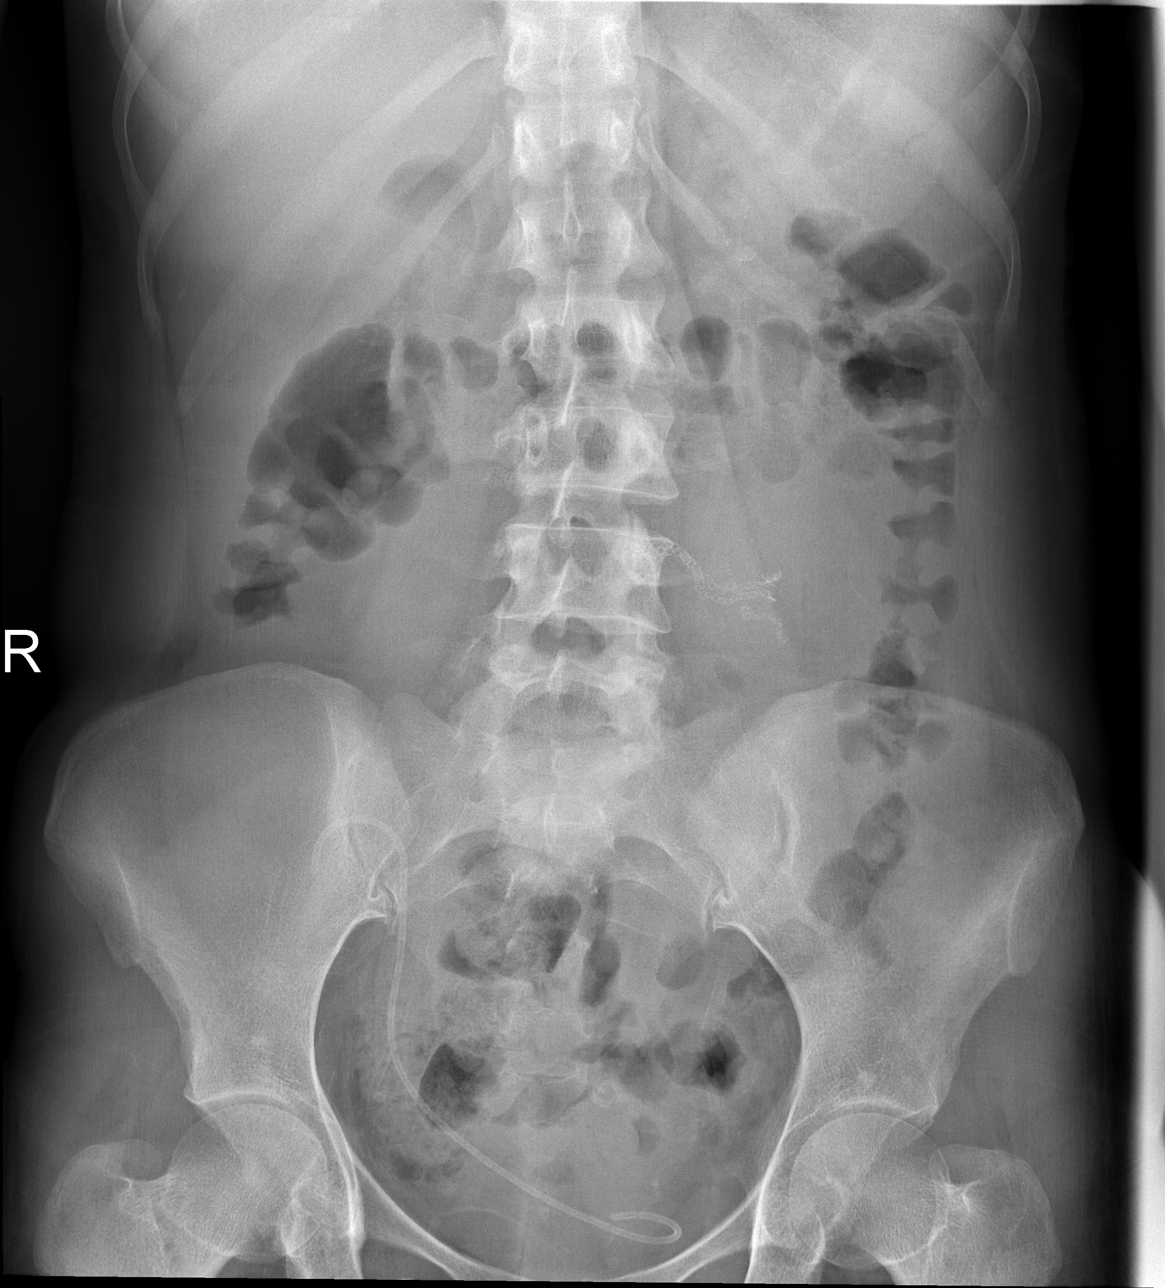

[3 of 3 positions shown; findings below may reference images not displayed]

FINDINGS: Lungs are clear.  No pleural effusion or pneumothorax.

The heart is normal size.

Nonobstructive bowel gas pattern. No evidence of free air under the
diaphragm on the upright view.

Postsurgical changes in the left mid abdomen.

Right lower quadrant transplant ureteral stent.
IMPRESSION: No evidence of acute cardiopulmonary disease.

No evidence of small bowel obstruction or free air.

Postsurgical changes in the abdomen/pelvis, better visualized on
recent prior CT.

## 2013-10-31 MED ORDER — METOCLOPRAMIDE HCL 5 MG/ML IJ SOLN
10.0000 mg | INTRAMUSCULAR | Status: AC
  Administered 2013-10-31: 10 mg via INTRAMUSCULAR
  Filled 2013-10-31: qty 2

## 2013-10-31 MED ORDER — PROMETHAZINE HCL 25 MG/ML IJ SOLN
12.5000 mg | Freq: Once | INTRAMUSCULAR | Status: AC
  Administered 2013-10-31: 12.5 mg via INTRAMUSCULAR
  Filled 2013-10-31: qty 1

## 2013-10-31 MED ORDER — DIPHENHYDRAMINE HCL 50 MG/ML IJ SOLN
12.5000 mg | Freq: Once | INTRAMUSCULAR | Status: AC
  Administered 2013-10-31: 12.5 mg via INTRAMUSCULAR
  Filled 2013-10-31: qty 1

## 2013-10-31 MED ORDER — GI COCKTAIL ~~LOC~~
30.0000 mL | Freq: Once | ORAL | Status: AC
  Administered 2013-10-31: 30 mL via ORAL
  Filled 2013-10-31: qty 30

## 2013-10-31 NOTE — ED Provider Notes (Signed)
CSN: [id number]     Arrival date & time 10/31/13  2221 History  This chart was scribed for [name] Benedict Alfonso Patten, MD by Eston Mould, ED Scribe. This patient was seen in room MH06/MH06 and the patient's care was started at 11:26 PM.   Chief Complaint  Patient presents with  . Abdominal Pain    L upper quadrant   Patient is a 33 y.o. female presenting with abdominal pain. The history is provided by the patient. No language interpreter was used.  Abdominal Pain Pain location:  LUQ Pain quality: aching, cramping and sharp   Pain radiates to:  Does not radiate Pain severity:  Moderate Onset quality:  Sudden Timing:  Constant Progression:  Unchanged Chronicity:  New Context: not alcohol use   Relieved by:  Nothing Worsened by:  Nothing tried Ineffective treatments:  None tried Associated symptoms: no diarrhea, no nausea, no vaginal bleeding and no vomiting   Risk factors: no recent hospitalization   HPI Comments: [name] [name] is a 33 y.o. female who presents to the Emergency Department complaining of ongoing worsening chronic LUQ abd pain that has been present for a month but worsened 9 hours ago. Pt state sher pain has been aggressive for the past week. Pt states she was informed her abd pain could be from the amount of medications she is currently taking due to pancreas and kidney transplant. She was advised to come to the ED. Pt denies diarrhea and emesis.  Past Medical History  Diagnosis Date  . DIABETES MELLITUS, TYPE I, UNCONTROLLED 11/05/2007  . MEDIAL EPICONDYLITIS, LEFT 12/17/2007  . Vision loss   . Renal disorder   . Diabetes mellitus   . Hypertension   . Anxiety   . Gastroparesis    Past Surgical History  Procedure Laterality Date  . Refractive surgery    . Eye surgery    . Av fistula placement  11/17/2011    Procedure: ARTERIOVENOUS (AV) FISTULA CREATION;  Surgeon: Rosetta Posner, MD;  Location: [place];  Service: Vascular;  Laterality: Right;  . Kidney  transplant    . Pancrease transplant     Family History  Problem Relation Age of Onset  . Hypertension Father    History  Substance Use Topics  . Smoking status: Former Smoker -- 1 years    Types: Pipe  . Smokeless tobacco: Never Used     Comment: Hooka  . Alcohol Use: No   OB History   Grav Para Term Preterm Abortions TAB SAB Ect Mult Living                 Review of Systems  Gastrointestinal: Positive for abdominal pain. Negative for nausea, vomiting and diarrhea.  Genitourinary: Negative for vaginal bleeding.  All other systems reviewed and are negative.   Allergies  Shrimp  Home Medications   Current Outpatient Rx  Name  Route  Sig  Dispense  Refill  . aspirin 81 MG tablet   Oral   Take 81 mg by mouth daily.         . cycloSPORINE (SANDIMMUNE) 100 MG capsule   Oral   Take 200 mg by mouth 2 (two) times daily. 275 mg total dose twice daily.         . cycloSPORINE (SANDIMMUNE) 25 MG capsule   Oral   Take 75 mg by mouth 2 (two) times daily. 275 mg total dose twice daily.         Marland Kitchen gabapentin (NEURONTIN) 100 MG capsule  Oral   Take 100 mg by mouth 3 (three) times daily.         . metoCLOPramide (REGLAN) 10 MG tablet   Oral   Take 10 mg by mouth 3 (three) times daily before meals.         . mycophenolate (MYFORTIC) 180 MG EC tablet   Oral   Take 540 mg by mouth 2 (two) times daily.          . ondansetron (ZOFRAN) 8 MG tablet   Oral   Take 1 tablet (8 mg total) by mouth every 8 (eight) hours as needed for nausea or vomiting.   20 tablet   0   . polyethylene glycol (MIRALAX) packet   Oral   Take 17 g by mouth daily.   5 each   0   . promethazine (PHENERGAN) 25 MG tablet   Oral   Take 1 tablet (25 mg total) by mouth every 6 (six) hours as needed for nausea or vomiting.   15 tablet   0   . traMADol (ULTRAM) 50 MG tablet   Oral   Take 1 tablet (50 mg total) by mouth every 6 (six) hours as needed.   15 tablet   0    BP 145/83   Pulse 110  Temp(Src) 97.8 F (36.6 C) (Oral)  Resp 22  Ht 5\' 2"  (1.575 m)  Wt 114 lb (51.71 kg)  BMI 20.85 kg/m2  SpO2 100%  LMP 10/20/2013  Physical Exam  Constitutional: She is oriented to person, place, and time. She appears well-developed and well-nourished.  HENT:  Head: Normocephalic and atraumatic.  Mouth/Throat: Oropharynx is clear and moist.  Eyes: Pupils are equal, round, and reactive to light.  Neck: Normal range of motion. Neck supple.  Cardiovascular: Normal rate, regular rhythm and normal heart sounds.   Pulmonary/Chest: Breath sounds normal. She has no wheezes. She has no rales.  Abdominal: Soft. Bowel sounds are normal. There is no tenderness. There is no rebound and no guarding.  Hyperactive bowel sounds.  Musculoskeletal: Normal range of motion.  Neurological: She is alert and oriented to person, place, and time.  Skin: Skin is warm and dry.  Psychiatric: She has a normal mood and affect. Her behavior is normal. Thought content normal.    ED Course  Procedures  DIAGNOSTIC STUDIES: Oxygen Saturation is 100% on RA, normal by my interpretation.    COORDINATION OF CARE: 11:29 PM-Discussed treatment plan which includes GI Cocktail, IM, and call transplant for further information. Pt agreed to plan.   Labs Review Labs Reviewed - No data to display Imaging Review No results found.   EKG Interpretation None     MDM   Final diagnoses:  None  Case d/w Dr. Judson Roch of transplant team who suspects the pain is chronic from surgery and gastroparesis and there is a drug seeking component.  He is concerned about constipation related to narcotic pain medication  Will treat for UTI.  Has a lot of gas and distal stool on XRAY recommend glycerine suppository and increased miralax dose.    Had lengthy discuss with patient in room > 10 minutes spent in consultation.  Patient is happy with care and will follow up with transplant and her pain manager for ongoing care  I  personally performed the services described in this documentation, which was scribed in my presence. The recorded information has been reviewed and is accurate.    [name], MD 11/01/13 [phone]

## 2013-10-31 NOTE — ED Provider Notes (Signed)
Medical screening examination/treatment/procedure(s) were performed by non-physician practitioner and as supervising physician I was immediately available for consultation/collaboration.  EKG Interpretation  None  Rolland Porter, MD, Abram Sander   Janice Norrie, MD 10/31/13 [phone]

## 2013-10-31 NOTE — ED Notes (Signed)
Pt. Has been c/o of abd. Pian x 10mth and the pain got worse approx. 9 hours ago today.  Pt. In no distress and no active vomiting noted.

## 2013-10-31 NOTE — ED Notes (Signed)
MD at bedside. 

## 2013-11-01 LAB — URINE MICROSCOPIC-ADD ON

## 2013-11-01 LAB — URINALYSIS, ROUTINE W REFLEX MICROSCOPIC
Bilirubin Urine: NEGATIVE
Glucose, UA: NEGATIVE mg/dL
KETONES UR: NEGATIVE mg/dL
Nitrite: NEGATIVE
Protein, ur: 30 mg/dL — AB
Specific Gravity, Urine: 1.023 (ref 1.005–1.030)
UROBILINOGEN UA: 0.2 mg/dL (ref 0.0–1.0)
pH: 5 (ref 5.0–8.0)

## 2013-11-01 MED ORDER — CEPHALEXIN 500 MG PO CAPS: 500.0000 mg | ORAL_CAPSULE | Freq: Four times a day (QID) | ORAL | Status: DC

## 2013-11-01 NOTE — ED Notes (Signed)
MD at bedside discussing pts pain with her.  MD discussing alternative methods to treat pain and relieve gas/stool pain.

## 2013-11-07 DIAGNOSIS — K3184 Gastroparesis: Secondary | ICD-10-CM | POA: Insufficient documentation

## 2013-11-07 DIAGNOSIS — E1043 Type 1 diabetes mellitus with diabetic autonomic (poly)neuropathy: Secondary | ICD-10-CM | POA: Insufficient documentation

## 2013-11-07 DIAGNOSIS — R112 Nausea with vomiting, unspecified: Secondary | ICD-10-CM | POA: Insufficient documentation

## 2013-11-21 ENCOUNTER — Emergency Department (HOSPITAL_COMMUNITY)
Admission: EM | Admit: 2013-11-21 | Discharge: 2013-11-21 | Discharge status: Against Medical Advice | Payer: Medicare Other | Attending: Emergency Medicine | Admitting: Emergency Medicine

## 2013-11-21 ENCOUNTER — Encounter (HOSPITAL_COMMUNITY): Payer: Self-pay | Admitting: Emergency Medicine

## 2013-11-21 DIAGNOSIS — Z940 Kidney transplant status: Secondary | ICD-10-CM | POA: Insufficient documentation

## 2013-11-21 DIAGNOSIS — Z792 Long term (current) use of antibiotics: Secondary | ICD-10-CM | POA: Insufficient documentation

## 2013-11-21 DIAGNOSIS — I10 Essential (primary) hypertension: Secondary | ICD-10-CM | POA: Insufficient documentation

## 2013-11-21 DIAGNOSIS — Z9483 Pancreas transplant status: Secondary | ICD-10-CM | POA: Insufficient documentation

## 2013-11-21 DIAGNOSIS — E1065 Type 1 diabetes mellitus with hyperglycemia: Secondary | ICD-10-CM | POA: Insufficient documentation

## 2013-11-21 DIAGNOSIS — K3184 Gastroparesis: Secondary | ICD-10-CM | POA: Insufficient documentation

## 2013-11-21 DIAGNOSIS — Z87891 Personal history of nicotine dependence: Secondary | ICD-10-CM | POA: Insufficient documentation

## 2013-11-21 DIAGNOSIS — F411 Generalized anxiety disorder: Secondary | ICD-10-CM | POA: Insufficient documentation

## 2013-11-21 DIAGNOSIS — Z7982 Long term (current) use of aspirin: Secondary | ICD-10-CM | POA: Insufficient documentation

## 2013-11-21 DIAGNOSIS — Z87448 Personal history of other diseases of urinary system: Secondary | ICD-10-CM | POA: Insufficient documentation

## 2013-11-21 DIAGNOSIS — Z79899 Other long term (current) drug therapy: Secondary | ICD-10-CM | POA: Insufficient documentation

## 2013-11-21 DIAGNOSIS — R109 Unspecified abdominal pain: Secondary | ICD-10-CM | POA: Insufficient documentation

## 2013-11-21 DIAGNOSIS — IMO0002 Reserved for concepts with insufficient information to code with codable children: Secondary | ICD-10-CM | POA: Insufficient documentation

## 2013-11-21 DIAGNOSIS — Z8669 Personal history of other diseases of the nervous system and sense organs: Secondary | ICD-10-CM | POA: Insufficient documentation

## 2013-11-21 HISTORY — DX: Patient's noncompliance with other medical treatment and regimen: Z91.19

## 2013-11-21 HISTORY — DX: Opioid abuse, uncomplicated: F11.10

## 2013-11-21 HISTORY — DX: Patient's noncompliance with other medical treatment and regimen due to unspecified reason: Z91.199

## 2013-11-21 MED ORDER — PROMETHAZINE HCL 25 MG/ML IJ SOLN
25.0000 mg | Freq: Once | INTRAMUSCULAR | Status: DC
  Filled 2013-11-21: qty 1

## 2013-11-21 MED ORDER — ONDANSETRON HCL 4 MG/2ML IJ SOLN
4.0000 mg | Freq: Once | INTRAMUSCULAR | Status: DC
  Filled 2013-11-21: qty 2

## 2013-11-21 MED ORDER — ALBUTEROL SULFATE (2.5 MG/3ML) 0.083% IN NEBU: 5.0000 mg | INHALATION_SOLUTION | Freq: Once | RESPIRATORY_TRACT | Status: DC

## 2013-11-21 MED ORDER — IOHEXOL 300 MG/ML  SOLN
25.0000 mL | Freq: Once | INTRAMUSCULAR | Status: AC | PRN
  Administered 2013-11-21: 25 mL via ORAL

## 2013-11-21 MED ORDER — HYDROMORPHONE HCL PF 1 MG/ML IJ SOLN: 1.0000 mg | Freq: Once | INTRAMUSCULAR | Status: DC

## 2013-11-21 MED ORDER — ACETAMINOPHEN 325 MG PO TABS: 650.0000 mg | ORAL_TABLET | Freq: Once | ORAL | Status: DC

## 2013-11-21 MED ORDER — SODIUM CHLORIDE 0.9 % IV BOLUS (SEPSIS): 1000.0000 mL | Freq: Once | INTRAVENOUS | Status: DC

## 2013-11-21 NOTE — ED Notes (Signed)
Pt. Refuses zofran. Pt. Wants phenergan. Dr. Marnette Burgess aware. Pt. Will not take oral contrast until she gets phenergan. Phenergan im ordered.

## 2013-11-21 NOTE — ED Notes (Signed)
Pt. Desires to leave [facility] because pts. Pain has not been addressed. Hx. Of narcotic abuse. Pt. Has ride. Vss. Dr. Marnette Burgess aware.

## 2013-11-21 NOTE — ED Notes (Signed)
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 Been having medical issues since transplants. Suffers from abd. Pain. Last Monday,. D/c from [facility] for uti. Now, generalized pain all over and vomiting for last hour.  vss  140/80, hr 100, rr 20,

## 2013-11-22 NOTE — ED Provider Notes (Signed)
CSN: [id number]     Arrival date & time 11/21/13  0419 History   First MD Initiated Contact with Patient 11/21/13 0423     No chief complaint on file.    (Consider location/radiation/quality/duration/timing/severity/associated sxs/prior Treatment) HPI HX per PT  - h/o kidney and pancrease transplant followed by [facility] presents with recurrent chronic upper ABD pain, points to her epigastric region, worse over the last 2 days, denies any taking any pain medications other than OTC medications. No associated F/C, tonight has N/V no diarrhea. She is requesting dilaudid and phenergan for her symptoms. She carries discharge paperwork with her that states h/o narcotic abuse. She also has h/o DM and gastroparesis. Last BM today. No blood in emesis or stools.    Past Medical History  Diagnosis Date  . DIABETES MELLITUS, TYPE I, UNCONTROLLED 11/05/2007  . MEDIAL EPICONDYLITIS, LEFT 12/17/2007  . Vision loss   . Renal disorder   . Diabetes mellitus   . Hypertension   . Anxiety   . Gastroparesis   . Narcotic abuse   . Non compliance with medical treatment    Past Surgical History  Procedure Laterality Date  . Refractive surgery    . Eye surgery    . Av fistula placement  11/17/2011    Procedure: ARTERIOVENOUS (AV) FISTULA CREATION;  Surgeon: Rosetta Posner, MD;  Location: [place];  Service: Vascular;  Laterality: Right;  . Kidney transplant    . Pancrease transplant     Family History  Problem Relation Age of Onset  . Hypertension Father    History  Substance Use Topics  . Smoking status: Former Smoker -- 1 years    Types: Pipe  . Smokeless tobacco: Never Used     Comment: Hooka  . Alcohol Use: No   OB History   Grav Para Term Preterm Abortions TAB SAB Ect Mult Living                 Review of Systems  Constitutional: Negative for fever and chills.  Respiratory: Negative for shortness of breath.   Cardiovascular: Negative for chest pain.  Gastrointestinal: Positive for  vomiting and abdominal pain.  Genitourinary: Negative for dysuria and flank pain.  Musculoskeletal: Negative for back pain.  Skin: Negative for rash.  Neurological: Negative for headaches.  All other systems reviewed and are negative.      Allergies  Shrimp  Home Medications   Current Outpatient Rx  Name  Route  Sig  Dispense  Refill  . aspirin 81 MG tablet   Oral   Take 81 mg by mouth daily.         . cephALEXin (KEFLEX) 500 MG capsule   Oral   Take 1 capsule (500 mg total) by mouth 4 (four) times daily.   28 capsule   0   . cycloSPORINE (SANDIMMUNE) 100 MG capsule   Oral   Take 200 mg by mouth 2 (two) times daily. 275 mg total dose twice daily.         . cycloSPORINE (SANDIMMUNE) 25 MG capsule   Oral   Take 75 mg by mouth 2 (two) times daily. 275 mg total dose twice daily.         Marland Kitchen gabapentin (NEURONTIN) 100 MG capsule   Oral   Take 100 mg by mouth 3 (three) times daily.         . metoCLOPramide (REGLAN) 10 MG tablet   Oral   Take 10 mg by mouth 3 (  three) times daily before meals.         . mycophenolate (MYFORTIC) 180 MG EC tablet   Oral   Take 540 mg by mouth 2 (two) times daily.          . ondansetron (ZOFRAN) 8 MG tablet   Oral   Take 1 tablet (8 mg total) by mouth every 8 (eight) hours as needed for nausea or vomiting.   20 tablet   0   . polyethylene glycol (MIRALAX) packet   Oral   Take 17 g by mouth daily.   5 each   0   . promethazine (PHENERGAN) 25 MG tablet   Oral   Take 1 tablet (25 mg total) by mouth every 6 (six) hours as needed for nausea or vomiting.   15 tablet   0   . traMADol (ULTRAM) 50 MG tablet   Oral   Take 1 tablet (50 mg total) by mouth every 6 (six) hours as needed.   15 tablet   0    BP 137/83  Pulse 85  SpO2 99%  LMP 10/20/2013 Physical Exam  Constitutional: She is oriented to person, place, and time. She appears well-developed and well-nourished.  HENT:  Head: Normocephalic and atraumatic.   Mouth/Throat: Oropharynx is clear and moist.  Eyes: EOM are normal. Pupils are equal, round, and reactive to light. No scleral icterus.  Neck: Neck supple.  Cardiovascular: Normal rate, regular rhythm and intact distal pulses.   Pulmonary/Chest: Effort normal and breath sounds normal. No respiratory distress. She exhibits no tenderness.  Abdominal: Soft. Bowel sounds are normal. She exhibits no distension. There is no rebound and no guarding.  TTP over epigastric region  Musculoskeletal: Normal range of motion. She exhibits no edema.  Neurological: She is alert and oriented to person, place, and time.  Skin: Skin is warm and dry.    ED Course  Procedures (including critical care time) Labs Review Labs Reviewed - No data to display Imaging Review No results found.  Labs and imaging studies order to further evaluate symptoms.  IVFs and medications ordered, unable to obtain IV access. PT declines IM phenergan and has escalating drug seeking behavior. Tylenol offered and PT declines. She decided to leave AMA, stating that her pain was not addressed adequately. She declined paperwork/ A/O x 4, no indication for IVC.   MDM   Dx: ABD pain  Left AMA prior to any labs or imaging Medications offered/ declined.    Teressa Lower, MD 11/22/13 [phone]

## 2013-12-16 ENCOUNTER — Encounter (HOSPITAL_COMMUNITY): Payer: Self-pay | Admitting: Emergency Medicine

## 2013-12-16 ENCOUNTER — Emergency Department (HOSPITAL_COMMUNITY): Payer: Medicare Other

## 2013-12-16 ENCOUNTER — Emergency Department (HOSPITAL_COMMUNITY)
Admission: EM | Admit: 2013-12-16 | Discharge: 2013-12-17 | Disposition: A | Discharge status: Home / Self Care | Payer: Medicare Other | Attending: Emergency Medicine | Admitting: Emergency Medicine

## 2013-12-16 DIAGNOSIS — R1084 Generalized abdominal pain: Secondary | ICD-10-CM | POA: Insufficient documentation

## 2013-12-16 DIAGNOSIS — Z8659 Personal history of other mental and behavioral disorders: Secondary | ICD-10-CM | POA: Insufficient documentation

## 2013-12-16 DIAGNOSIS — Z87448 Personal history of other diseases of urinary system: Secondary | ICD-10-CM | POA: Insufficient documentation

## 2013-12-16 DIAGNOSIS — Z79899 Other long term (current) drug therapy: Secondary | ICD-10-CM | POA: Insufficient documentation

## 2013-12-16 DIAGNOSIS — Z7982 Long term (current) use of aspirin: Secondary | ICD-10-CM | POA: Insufficient documentation

## 2013-12-16 DIAGNOSIS — R109 Unspecified abdominal pain: Secondary | ICD-10-CM

## 2013-12-16 DIAGNOSIS — Z3202 Encounter for pregnancy test, result negative: Secondary | ICD-10-CM | POA: Insufficient documentation

## 2013-12-16 DIAGNOSIS — Z91199 Patient's noncompliance with other medical treatment and regimen due to unspecified reason: Secondary | ICD-10-CM | POA: Insufficient documentation

## 2013-12-16 DIAGNOSIS — Z9119 Patient's noncompliance with other medical treatment and regimen: Secondary | ICD-10-CM | POA: Insufficient documentation

## 2013-12-16 DIAGNOSIS — G8929 Other chronic pain: Secondary | ICD-10-CM | POA: Insufficient documentation

## 2013-12-16 DIAGNOSIS — Z87891 Personal history of nicotine dependence: Secondary | ICD-10-CM | POA: Insufficient documentation

## 2013-12-16 DIAGNOSIS — K5900 Constipation, unspecified: Secondary | ICD-10-CM

## 2013-12-16 DIAGNOSIS — Z8669 Personal history of other diseases of the nervous system and sense organs: Secondary | ICD-10-CM | POA: Insufficient documentation

## 2013-12-16 LAB — CBC WITH DIFFERENTIAL/PLATELET
Basophils Absolute: 0 10*3/uL (ref 0.0–0.1)
Basophils Relative: 0 % (ref 0–1)
Eosinophils Absolute: 0.2 10*3/uL (ref 0.0–0.7)
Eosinophils Relative: 6 % — ABNORMAL HIGH (ref 0–5)
HCT: 37 % (ref 36.0–46.0)
Hemoglobin: 12.9 g/dL (ref 12.0–15.0)
Lymphocytes Relative: 18 % (ref 12–46)
Lymphs Abs: 0.6 10*3/uL — ABNORMAL LOW (ref 0.7–4.0)
MCH: 30.9 pg (ref 26.0–34.0)
MCHC: 34.9 g/dL (ref 30.0–36.0)
MCV: 88.5 fL (ref 78.0–100.0)
Monocytes Absolute: 0.4 10*3/uL (ref 0.1–1.0)
Monocytes Relative: 14 % — ABNORMAL HIGH (ref 3–12)
Neutro Abs: 2 10*3/uL (ref 1.7–7.7)
Neutrophils Relative %: 62 % (ref 43–77)
Platelets: 165 10*3/uL (ref 150–400)
RBC: 4.18 MIL/uL (ref 3.87–5.11)
RDW: 13 % (ref 11.5–15.5)
WBC: 3.1 10*3/uL — ABNORMAL LOW (ref 4.0–10.5)

## 2013-12-16 LAB — COMPREHENSIVE METABOLIC PANEL
ALT: 16 U/L (ref 0–35)
AST: 22 U/L (ref 0–37)
Albumin: 3.5 g/dL (ref 3.5–5.2)
Alkaline Phosphatase: 154 U/L — ABNORMAL HIGH (ref 39–117)
BUN: 31 mg/dL — ABNORMAL HIGH (ref 6–23)
CO2: 20 mEq/L (ref 19–32)
Calcium: 9.6 mg/dL (ref 8.4–10.5)
Chloride: 108 mEq/L (ref 96–112)
Creatinine, Ser: 0.98 mg/dL (ref 0.50–1.10)
GFR calc Af Amer: 88 mL/min — ABNORMAL LOW (ref >90)
GFR calc non Af Amer: 76 mL/min — ABNORMAL LOW (ref >90)
Glucose, Bld: 99 mg/dL (ref 70–99)
Potassium: 5.1 mEq/L (ref 3.7–5.3)
Sodium: 140 mEq/L (ref 137–147)
Total Bilirubin: 0.3 mg/dL (ref 0.3–1.2)
Total Protein: 7.6 g/dL (ref 6.0–8.3)

## 2013-12-16 LAB — URINALYSIS, ROUTINE W REFLEX MICROSCOPIC
Bilirubin Urine: NEGATIVE
Glucose, UA: NEGATIVE mg/dL
Hgb urine dipstick: NEGATIVE
Ketones, ur: NEGATIVE mg/dL
Leukocytes, UA: NEGATIVE
Nitrite: NEGATIVE
Protein, ur: NEGATIVE mg/dL
Specific Gravity, Urine: 1.021 (ref 1.005–1.030)
Urobilinogen, UA: 0.2 mg/dL (ref 0.0–1.0)
pH: 5 (ref 5.0–8.0)

## 2013-12-16 LAB — LIPASE, BLOOD: Lipase: 30 U/L (ref 11–59)

## 2013-12-16 LAB — PREGNANCY, URINE: Preg Test, Ur: NEGATIVE

## 2013-12-16 IMAGING — CR DG ABDOMEN ACUTE W/ 1V CHEST
3 series · 3 of 3 positions shown · non-contrast
Comparison: DG ABD ACUTE W/CHEST dated [DATE]; CT ABD/PELV WO CM
dated [DATE]

CLINICAL DATA: Abdominal pain.

EXAM:
ACUTE ABDOMEN SERIES (ABDOMEN 2 VIEW & CHEST 1 VIEW)

[w chest pa]
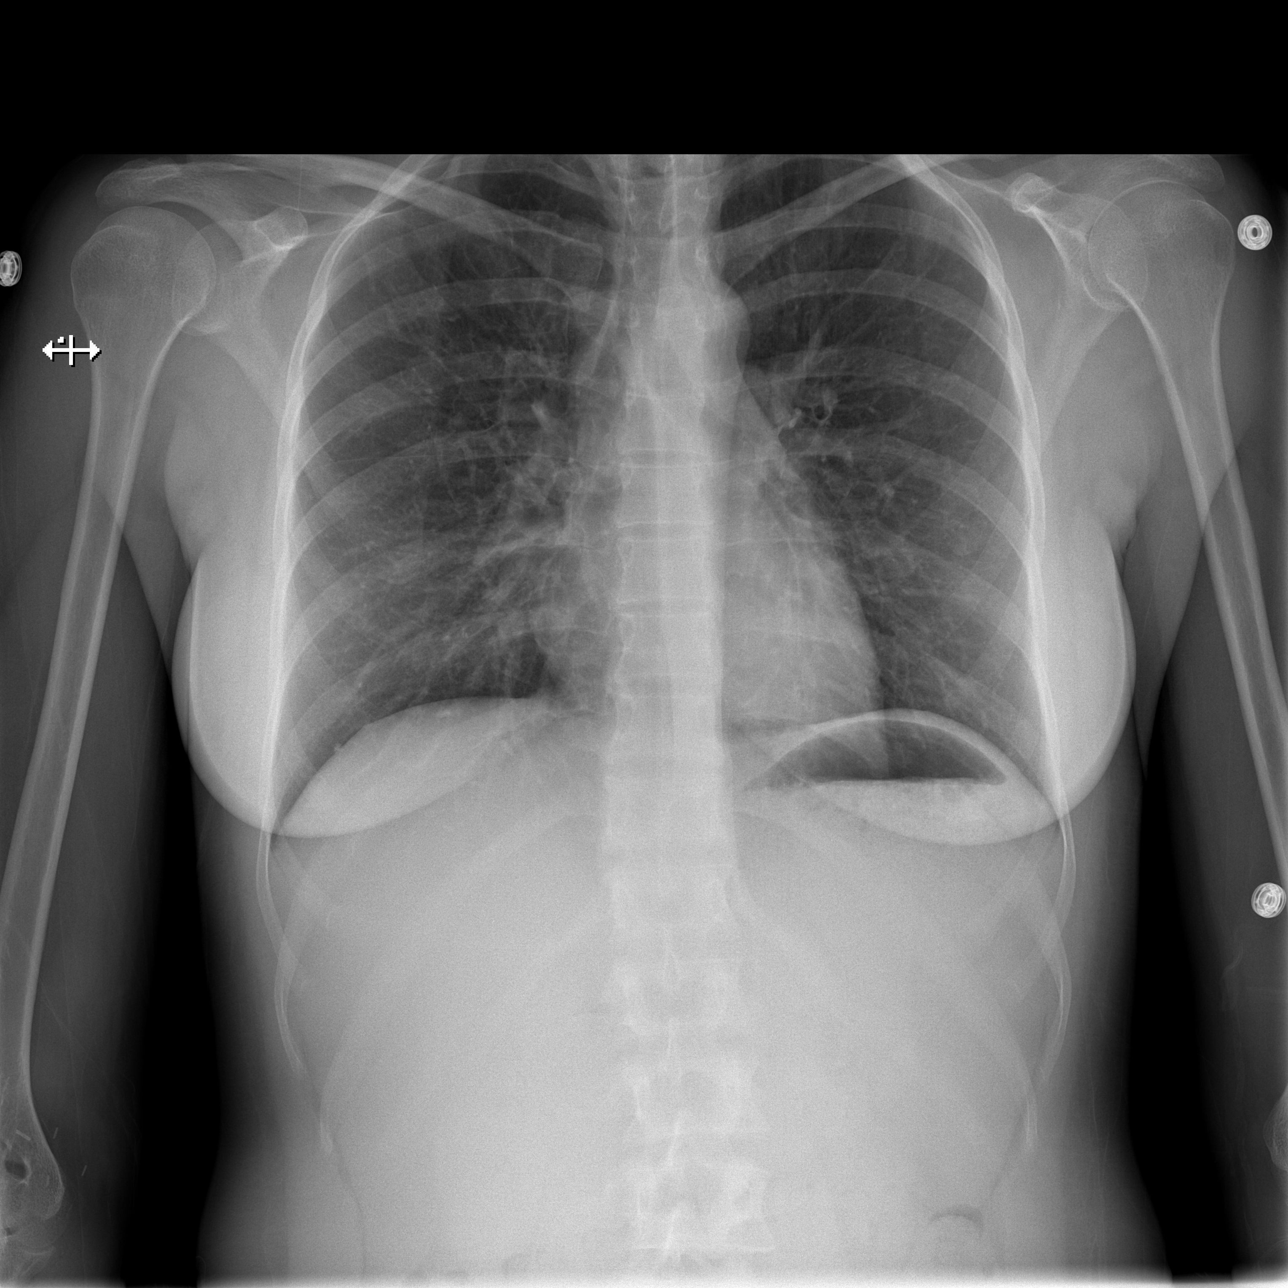

[w abdomen upright]
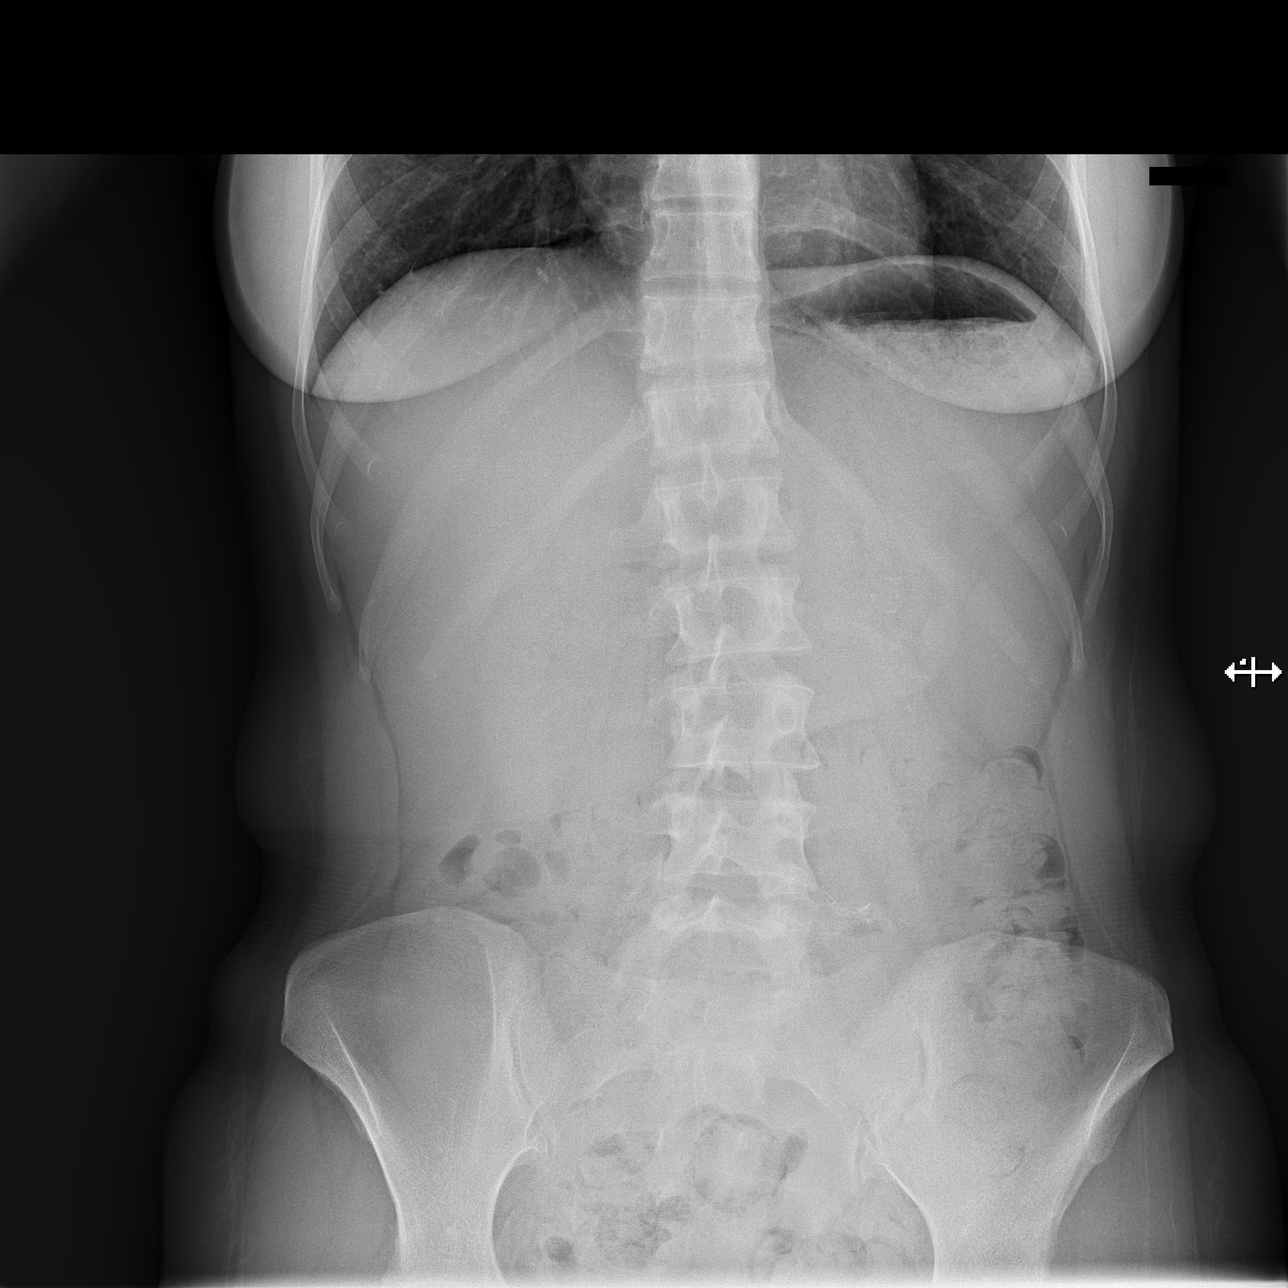

[t abdomen supine]
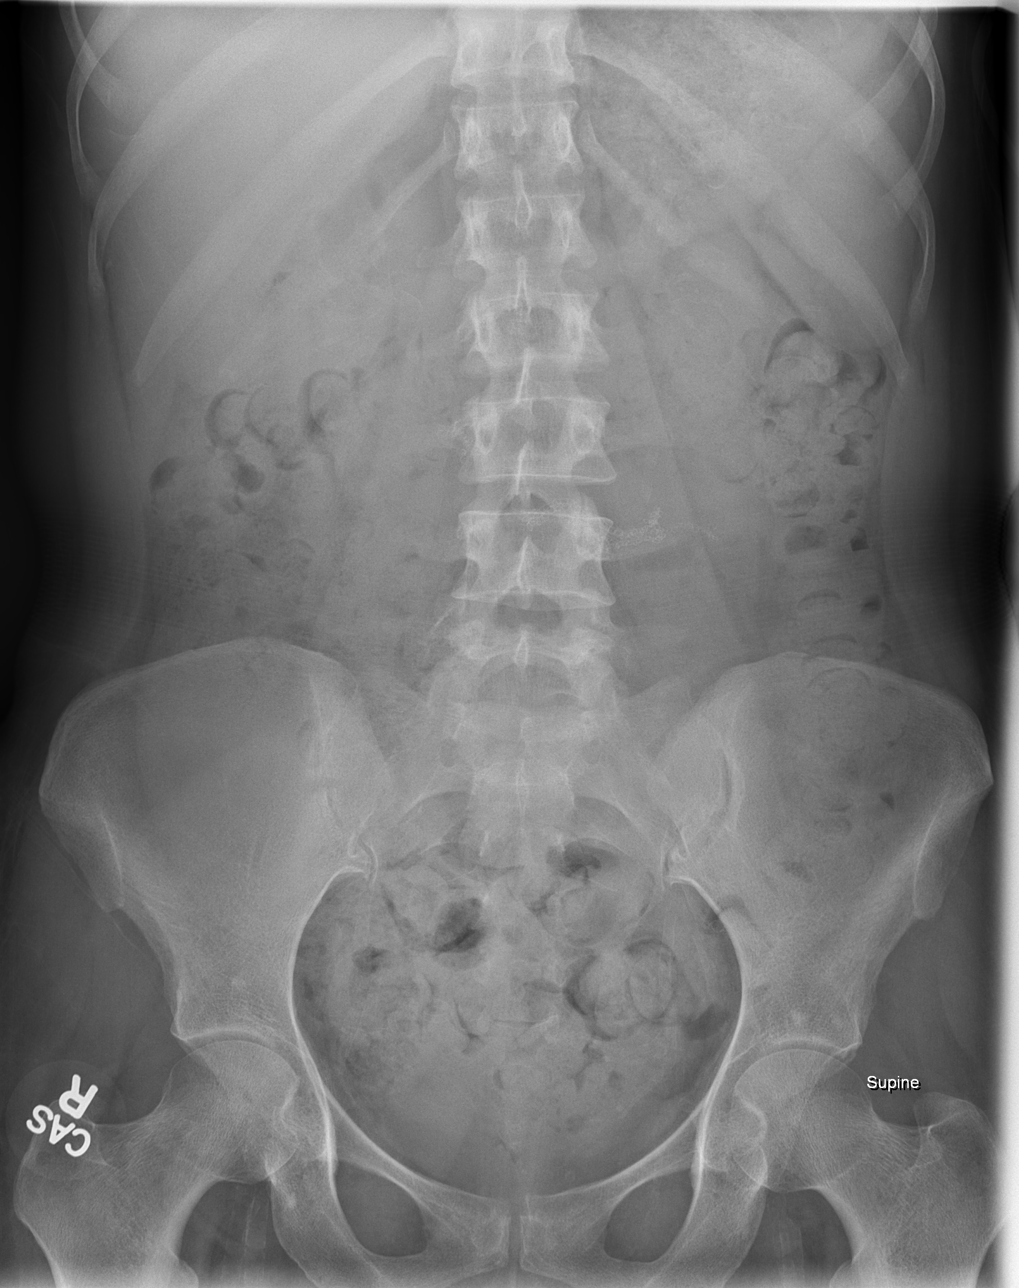

[3 of 3 positions shown; findings below may reference images not displayed]

FINDINGS: There is no evidence of pulmonary edema, consolidation,
pneumothorax, nodule or pleural fluid. Abdominal films show moderate
fecal material throughout the colon without evidence of focal
impaction. There is no evidence of small bowel obstruction. There
does appear to be some gastric distention with debris and fluid
present in the stomach. No free air. No abnormal calcifications.
Evidence of prior bowel anastomosis to the left of midline.
IMPRESSION: Moderate fecal material throughout the colon. Gastric distention. No
small bowel obstruction.

## 2013-12-16 MED ORDER — ONDANSETRON HCL 4 MG/2ML IJ SOLN
4.0000 mg | Freq: Once | INTRAMUSCULAR | Status: AC
  Administered 2013-12-16: 4 mg via INTRAVENOUS
  Filled 2013-12-16: qty 2

## 2013-12-16 MED ORDER — SODIUM CHLORIDE 0.9 % IV BOLUS (SEPSIS)
1000.0000 mL | Freq: Once | INTRAVENOUS | Status: AC
  Administered 2013-12-16: 1000 mL via INTRAVENOUS

## 2013-12-16 MED ORDER — MORPHINE SULFATE 4 MG/ML IJ SOLN
6.0000 mg | Freq: Once | INTRAMUSCULAR | Status: AC
  Administered 2013-12-16: 6 mg via INTRAVENOUS
  Filled 2013-12-16: qty 2

## 2013-12-16 MED ORDER — HYDROMORPHONE HCL PF 1 MG/ML IJ SOLN
1.0000 mg | Freq: Once | INTRAMUSCULAR | Status: AC
  Administered 2013-12-16: 1 mg via INTRAVENOUS
  Filled 2013-12-16: qty 1

## 2013-12-16 MED ORDER — ONDANSETRON HCL 4 MG/2ML IJ SOLN
4.0000 mg | Freq: Once | INTRAMUSCULAR | Status: AC
  Administered 2013-12-17: 4 mg via INTRAVENOUS
  Filled 2013-12-16: qty 2

## 2013-12-16 NOTE — ED Notes (Signed)
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  Pt describes it as sharp pains.

## 2013-12-16 NOTE — ED Notes (Signed)
Pt requesting dilaudid for pain and phenergan for nausea/vomiting bc that is what she states she always gets at [facility] and works very well for her. Made PA student aware.

## 2013-12-16 NOTE — ED Notes (Signed)
Pt is requesting something for pain and nausea, Gerald Stabs, [place], made aware.

## 2013-12-16 NOTE — Discharge Instructions (Signed)
Return here as needed. Follow up with your doctor. Increase your fluid intake. You have significant constipation on x-rays. Use miralax and stool softeners.

## 2013-12-16 NOTE — ED Provider Notes (Signed)
CSN: [id number]     Arrival date & time 12/16/13  1727 History   First MD Initiated Contact with Patient 12/16/13 1747     Chief Complaint  Patient presents with  . Abdominal Pain     (Consider location/radiation/quality/duration/timing/severity/associated sxs/prior Treatment) HPI Patient presents to the emergency department with abdominal pain, that started yesterday.  The patient, states his chronic abdominal pain, similar symptoms in the past.  The patient, states, that she is concerned that she may have a bowel instruction.  Patient, states, that it feels, not as bad as her previous bowel obstruction.  The patient, states, that she's not had any headache, blurred, weakness, dizziness, back pain, dysuria, neck pain, fever, Chest pain, shortness of breath, or diarrhea.  Patient did not take any medications prior to arrival.  Patient, states nothing seems make her condition, better or worse Past Medical History  Diagnosis Date  . Vision loss   . Renal disorder   . Anxiety   . Gastroparesis   . Narcotic abuse   . Non compliance with medical treatment    Past Surgical History  Procedure Laterality Date  . Refractive surgery    . Eye surgery    . Av fistula placement  11/17/2011    Procedure: ARTERIOVENOUS (AV) FISTULA CREATION;  Surgeon: Rosetta Posner, MD;  Location: [place];  Service: Vascular;  Laterality: Right;  . Kidney transplant    . Pancrease transplant     Family History  Problem Relation Age of Onset  . Hypertension Father    History  Substance Use Topics  . Smoking status: Former Smoker -- 1 years    Types: Pipe  . Smokeless tobacco: Never Used     Comment: Hooka  . Alcohol Use: No   OB History   Grav Para Term Preterm Abortions TAB SAB Ect Mult Living                 Review of Systems  All other systems negative except as documented in the HPI. All pertinent positives and negatives as reviewed in the HPI.  Allergies  Shrimp  Home Medications   Prior to  Admission medications   Medication Sig Start Date End Date Taking? Authorizing Provider  aspirin 81 MG tablet Take 81 mg by mouth daily.   Yes Historical Provider, MD  cycloSPORINE (SANDIMMUNE) 100 MG capsule Take 200 mg by mouth 2 (two) times daily. For a 275 mg total dose twice daily.   Yes Historical Provider, MD  cycloSPORINE (SANDIMMUNE) 25 MG capsule Take 75 mg by mouth 2 (two) times daily. For a total dose of 275 mg twice daily   Yes Historical Provider, MD  metoCLOPramide (REGLAN) 10 MG tablet Take 10 mg by mouth 3 (three) times daily before meals.   Yes Historical Provider, MD  mycophenolate (MYFORTIC) 180 MG EC tablet Take 540 mg by mouth 2 (two) times daily.    Yes Historical Provider, MD   BP 142/76  Pulse 83  Temp(Src) 97.9 F (36.6 C) (Oral)  Resp 16  SpO2 100%  LMP 11/11/2013 Physical Exam  Nursing note and vitals reviewed. Constitutional: She is oriented to person, place, and time. She appears well-developed and well-nourished. No distress.  HENT:  Head: Normocephalic and atraumatic.  Mouth/Throat: Oropharynx is clear and moist.  Eyes: Pupils are equal, round, and reactive to light.  Neck: Normal range of motion. Neck supple.  Cardiovascular: Normal rate, regular rhythm and normal heart sounds.  Exam reveals no gallop and  no friction rub.   No murmur heard. Pulmonary/Chest: Effort normal and breath sounds normal. No respiratory distress.  Abdominal: Soft. Normal appearance and bowel sounds are normal. There is generalized tenderness. There is no rigidity, no rebound, no guarding, no CVA tenderness, no tenderness at McBurney's point and negative Murphy's sign. No hernia.  Neurological: She is alert and oriented to person, place, and time. She exhibits normal muscle tone. Coordination normal.  Skin: Skin is warm and dry.    ED Course  Procedures (including critical care time) Labs Review Labs Reviewed  CBC WITH DIFFERENTIAL - Abnormal; Notable for the following:     WBC 3.1 (*)    Lymphs Abs 0.6 (*)    Monocytes Relative 14 (*)    Eosinophils Relative 6 (*)    All other components within normal limits  COMPREHENSIVE METABOLIC PANEL - Abnormal; Notable for the following:    BUN 31 (*)    Alkaline Phosphatase 154 (*)    GFR calc non Af Amer 76 (*)    GFR calc Af Amer 88 (*)    All other components within normal limits  URINE CULTURE  URINALYSIS, ROUTINE W REFLEX MICROSCOPIC  PREGNANCY, URINE  LIPASE, BLOOD    Imaging Review Dg Abd Acute W/chest  12/16/2013   CLINICAL DATA:  Abdominal pain.  EXAM: ACUTE ABDOMEN SERIES (ABDOMEN 2 VIEW & CHEST 1 VIEW)  COMPARISON:  DG ABD ACUTE W/CHEST dated 10/31/2013; CT ABD/PELV WO CM dated 10/26/2013  FINDINGS: There is no evidence of pulmonary edema, consolidation, pneumothorax, nodule or pleural fluid. Abdominal films show moderate fecal material throughout the colon without evidence of focal impaction. There is no evidence of small bowel obstruction. There does appear to be some gastric distention with debris and fluid present in the stomach. No free air. No abnormal calcifications. Evidence of prior bowel anastomosis to the left of midline.  IMPRESSION: Moderate fecal material throughout the colon. Gastric distention. No small bowel obstruction.   Electronically Signed   By: Aletta Edouard M.D.   On: 12/16/2013 19:12     Patient is feeling some better following pain medications.  Patient, states she is concerned.  This could be constipation.  She said that in the past and this caused her significant abdominal pain.  Patient is advised to return here as needed and follow up with her primary care Dr. for recheck.  This is most likely related to constipation in her chronic abdominal pain.  Patient voices an understanding.  All questions were answered  Brent General, PA-C 12/16/13 2303

## 2013-12-16 NOTE — ED Notes (Signed)
Pt states the pain is similar to the pain she had back in Feb when she was admitted to the hospital for UTI.  Pt also c/o burning with urination.

## 2013-12-17 NOTE — ED Notes (Signed)
Patient was provided discharge instructions and verbalized understanding however she left without providing an e-signature.

## 2013-12-18 LAB — URINE CULTURE
Colony Count: NO GROWTH
Culture: NO GROWTH

## 2013-12-18 NOTE — ED Provider Notes (Signed)
Medical screening examination/treatment/procedure(s) were performed by non-physician practitioner and as supervising physician I was immediately available for consultation/collaboration.   EKG Interpretation None        Tanna Furry, MD 12/18/13 [phone]

## 2013-12-20 ENCOUNTER — Encounter (HOSPITAL_COMMUNITY): Payer: Self-pay | Admitting: Emergency Medicine

## 2013-12-20 ENCOUNTER — Emergency Department (HOSPITAL_COMMUNITY)
Admission: EM | Admit: 2013-12-20 | Discharge: 2013-12-21 | Disposition: A | Discharge status: Home / Self Care | Payer: Medicare Other | Attending: Emergency Medicine | Admitting: Emergency Medicine

## 2013-12-20 ENCOUNTER — Emergency Department (HOSPITAL_COMMUNITY): Payer: Medicare Other

## 2013-12-20 DIAGNOSIS — Z9119 Patient's noncompliance with other medical treatment and regimen: Secondary | ICD-10-CM | POA: Insufficient documentation

## 2013-12-20 DIAGNOSIS — N289 Disorder of kidney and ureter, unspecified: Secondary | ICD-10-CM | POA: Insufficient documentation

## 2013-12-20 DIAGNOSIS — Z79899 Other long term (current) drug therapy: Secondary | ICD-10-CM | POA: Insufficient documentation

## 2013-12-20 DIAGNOSIS — Z940 Kidney transplant status: Secondary | ICD-10-CM | POA: Insufficient documentation

## 2013-12-20 DIAGNOSIS — F411 Generalized anxiety disorder: Secondary | ICD-10-CM | POA: Insufficient documentation

## 2013-12-20 DIAGNOSIS — Z91199 Patient's noncompliance with other medical treatment and regimen due to unspecified reason: Secondary | ICD-10-CM | POA: Insufficient documentation

## 2013-12-20 DIAGNOSIS — K3184 Gastroparesis: Secondary | ICD-10-CM | POA: Insufficient documentation

## 2013-12-20 DIAGNOSIS — Z9483 Pancreas transplant status: Secondary | ICD-10-CM | POA: Insufficient documentation

## 2013-12-20 DIAGNOSIS — R109 Unspecified abdominal pain: Secondary | ICD-10-CM

## 2013-12-20 DIAGNOSIS — Z3202 Encounter for pregnancy test, result negative: Secondary | ICD-10-CM | POA: Insufficient documentation

## 2013-12-20 DIAGNOSIS — Z87891 Personal history of nicotine dependence: Secondary | ICD-10-CM | POA: Insufficient documentation

## 2013-12-20 DIAGNOSIS — H547 Unspecified visual loss: Secondary | ICD-10-CM | POA: Insufficient documentation

## 2013-12-20 DIAGNOSIS — N390 Urinary tract infection, site not specified: Secondary | ICD-10-CM | POA: Insufficient documentation

## 2013-12-20 DIAGNOSIS — Z7982 Long term (current) use of aspirin: Secondary | ICD-10-CM | POA: Insufficient documentation

## 2013-12-20 LAB — POC URINE PREG, ED: Preg Test, Ur: NEGATIVE

## 2013-12-20 IMAGING — CR DG ABDOMEN ACUTE W/ 1V CHEST
3 series · 3 of 3 positions shown · non-contrast
Comparison: [DATE]

CLINICAL DATA: Abdominal pain starting yesterday. Previous history
of chronic abdominal pain. Previous bowel obstructions.

EXAM:
ACUTE ABDOMEN SERIES (ABDOMEN 2 VIEW & CHEST 1 VIEW)

[w chest pa]
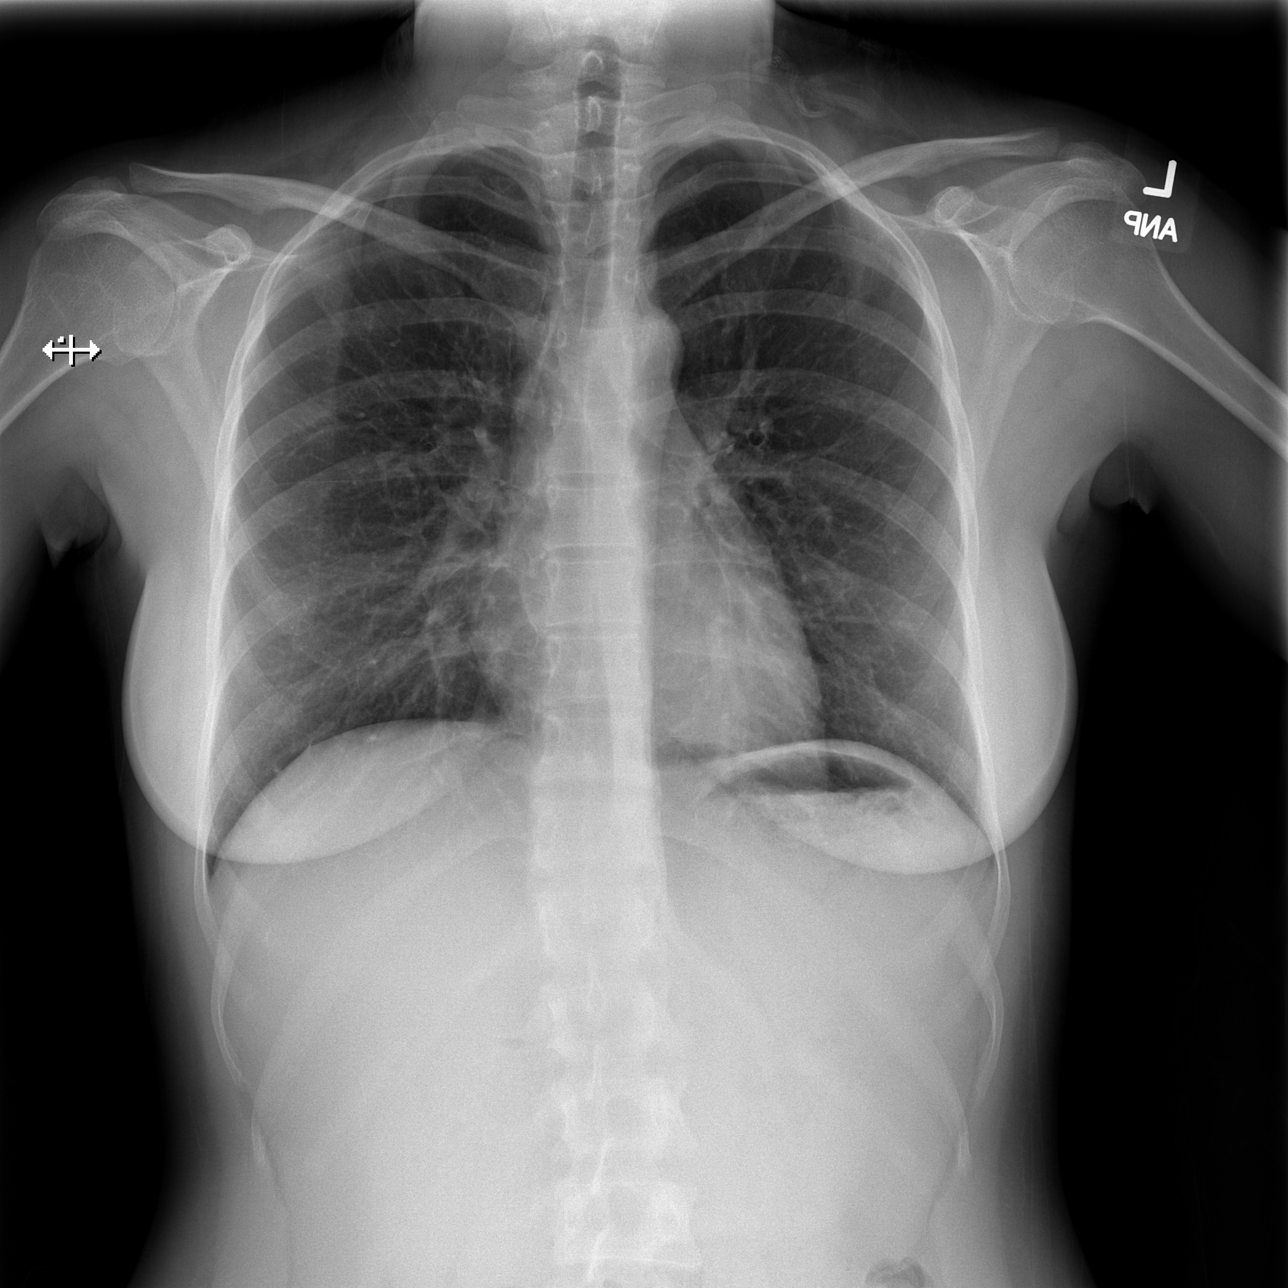

[w abdomen upright]
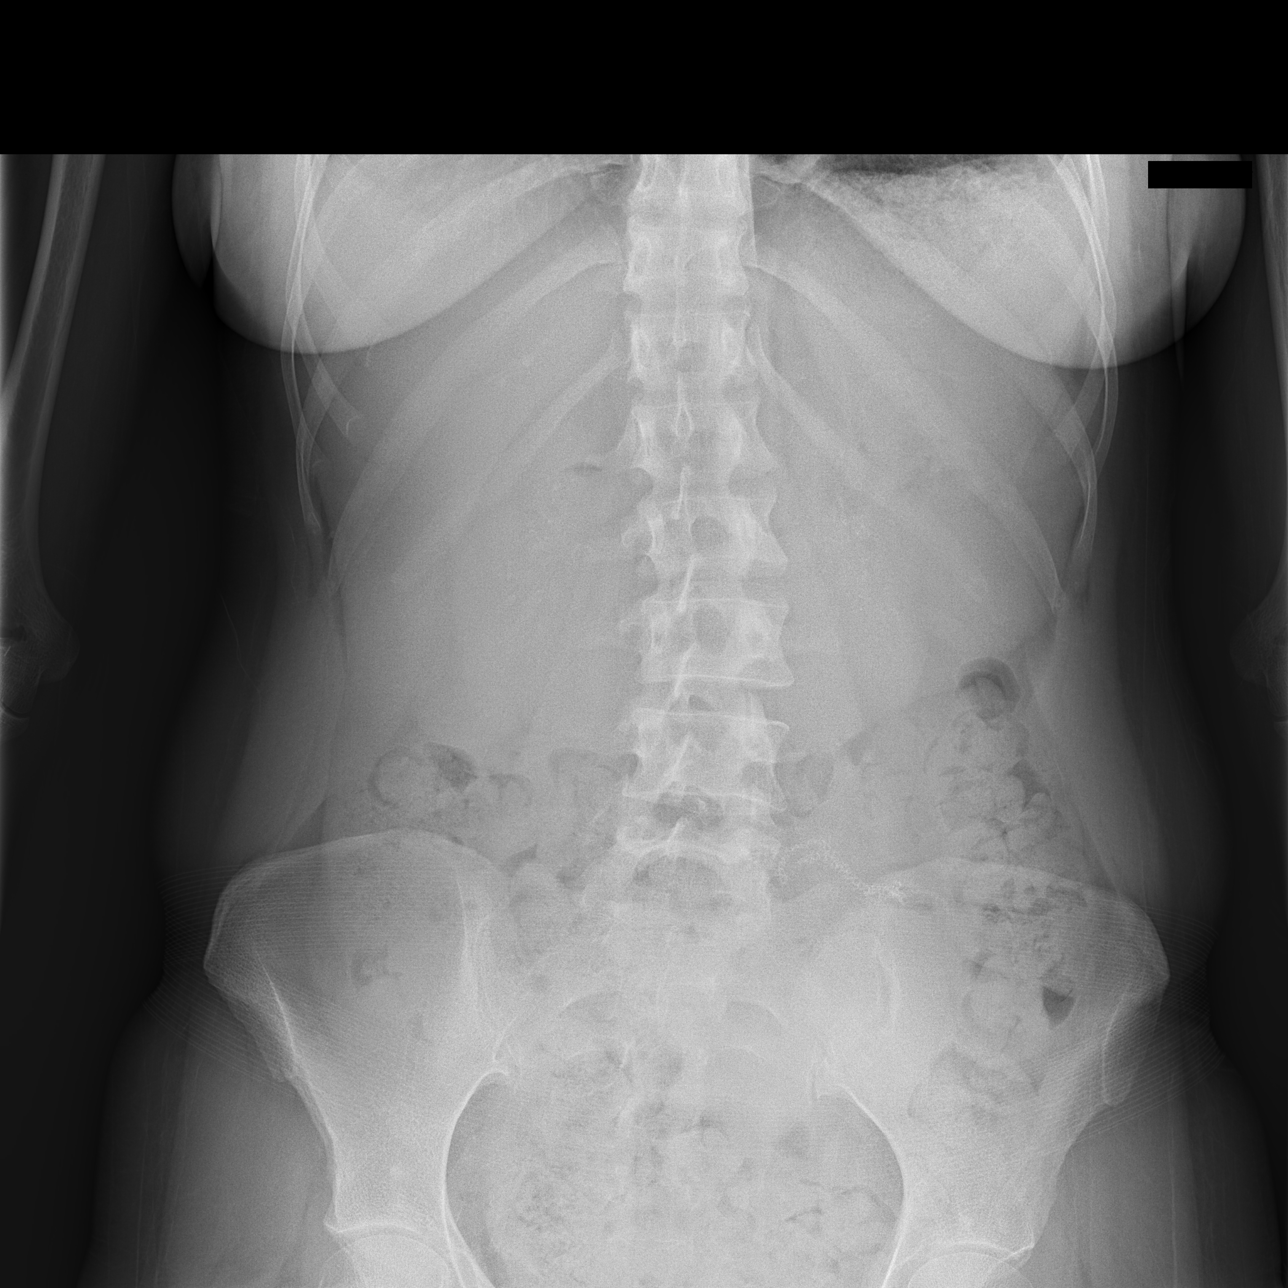

[t abdomen supine]
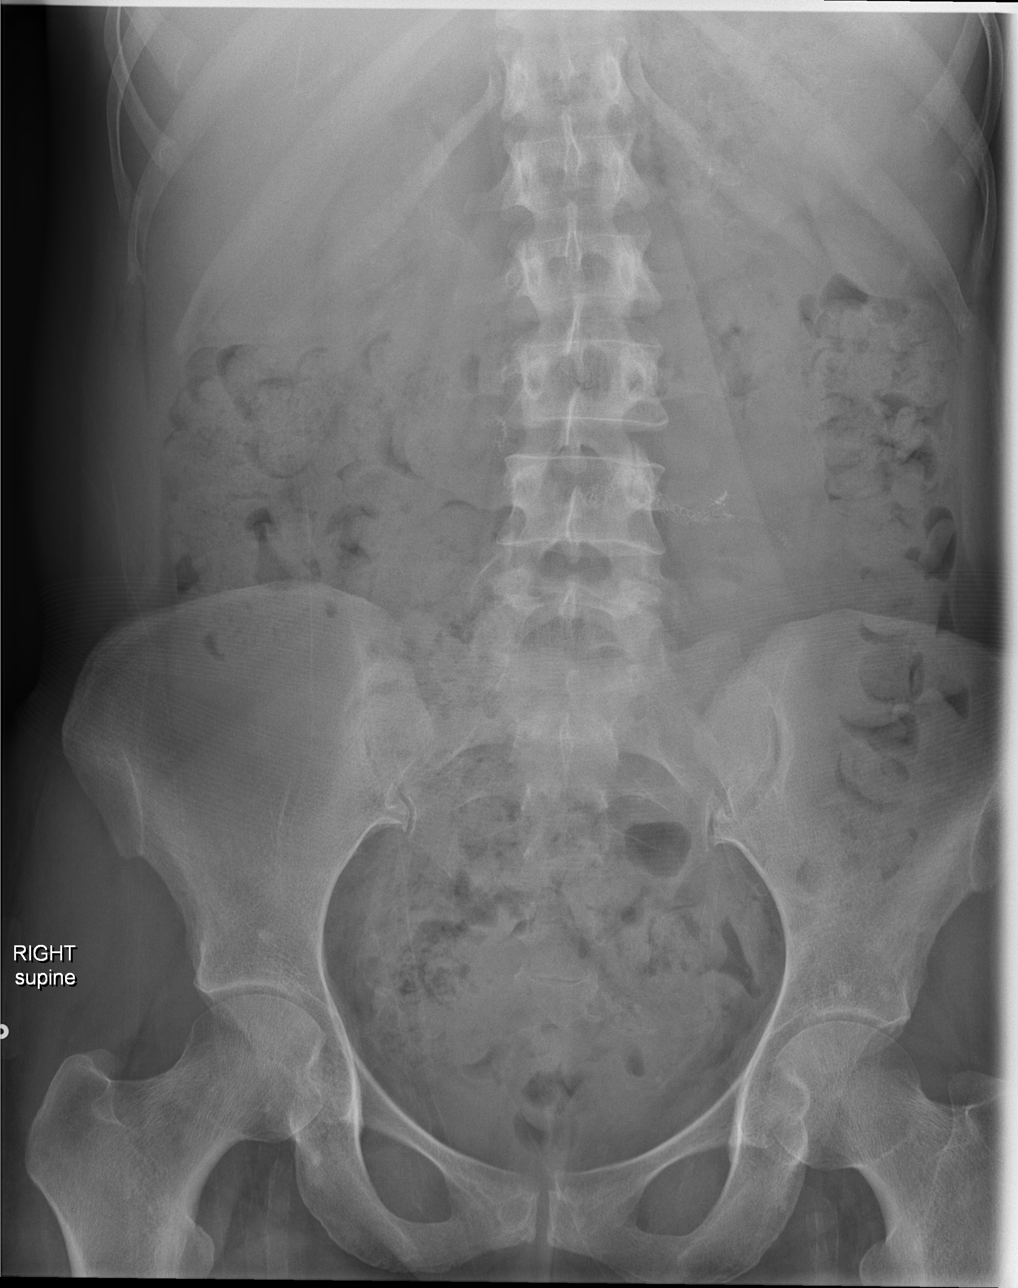

[3 of 3 positions shown; findings below may reference images not displayed]

FINDINGS: Stool-filled colon. Surgical clips in the mid abdomen. There is no
evidence of dilated bowel loops or free intraperitoneal air. No
radiopaque calculi or other significant radiographic abnormality is
seen. Heart size and mediastinal contours are within normal limits.
Both lungs are clear.
IMPRESSION: Negative abdominal radiographs.  No acute cardiopulmonary disease.

## 2013-12-20 MED ORDER — ONDANSETRON HCL 4 MG/2ML IJ SOLN
4.0000 mg | Freq: Once | INTRAMUSCULAR | Status: AC
  Administered 2013-12-21: 4 mg via INTRAVENOUS
  Filled 2013-12-20: qty 2

## 2013-12-20 MED ORDER — HYDROMORPHONE HCL PF 1 MG/ML IJ SOLN
1.0000 mg | Freq: Once | INTRAMUSCULAR | Status: AC
  Administered 2013-12-21: 1 mg via INTRAVENOUS
  Filled 2013-12-20: qty 1

## 2013-12-20 NOTE — ED Provider Notes (Addendum)
CSN: [id number]     Arrival date & time 12/20/13  2147 History   First MD Initiated Contact with Patient 12/20/13 2245     Chief Complaint  Patient presents with  . Abdominal Pain     (Consider location/radiation/quality/duration/timing/severity/associated sxs/prior Treatment) Patient is a 33 y.o. female presenting with abdominal pain. The history is provided by the patient and medical records. No language interpreter was used.  Abdominal Pain Associated symptoms: diarrhea, hematuria, nausea and vomiting   Associated symptoms: no chest pain, no constipation, no cough, no dysuria, no fatigue, no fever and no shortness of breath     Brynja Alverio is a 33 y.o. female  with a hx of kidney and pancrease transplant followed by [facility] with subsequent chronic abd pain presents to the Emergency Department complaining of gradual, persistent, progressively worsening lower abd pain onset 3PM.  Pt reports she had emesis x1 today as well as 1 loose stool.  Pt reports the pain is sharp in nature, worsened with defecation and urination.  She reports being concerned about infection.  Associated symptoms include hematuria x1 around 4PM.  Nothing makes it better and nothing makes it worse.  Pt did not take any OTC medications at home.  Pt denies fever, chills, headache, neck pain, chest pain, SOB, weakness, dizziness, syncope.      Past Medical History  Diagnosis Date  . Vision loss   . Renal disorder   . Anxiety   . Gastroparesis   . Narcotic abuse   . Non compliance with medical treatment    Past Surgical History  Procedure Laterality Date  . Refractive surgery    . Eye surgery    . Av fistula placement  11/17/2011    Procedure: ARTERIOVENOUS (AV) FISTULA CREATION;  Surgeon: Rosetta Posner, MD;  Location: [place];  Service: Vascular;  Laterality: Right;  . Kidney transplant    . Pancrease transplant     Family History  Problem Relation Age of Onset  . Hypertension Father    History   Substance Use Topics  . Smoking status: Former Smoker -- 1 years    Types: Pipe  . Smokeless tobacco: Never Used     Comment: Hooka  . Alcohol Use: No   OB History   Grav Para Term Preterm Abortions TAB SAB Ect Mult Living                 Review of Systems  Constitutional: Negative for fever, diaphoresis, appetite change, fatigue and unexpected weight change.  HENT: Negative for mouth sores and trouble swallowing.   Respiratory: Negative for cough, chest tightness, shortness of breath, wheezing and stridor.   Cardiovascular: Negative for chest pain and palpitations.  Gastrointestinal: Positive for nausea, vomiting, abdominal pain and diarrhea. Negative for constipation, blood in stool, abdominal distention and rectal pain.  Genitourinary: Positive for hematuria and flank pain (bilateral). Negative for dysuria, urgency, frequency and difficulty urinating.  Musculoskeletal: Negative for back pain, neck pain and neck stiffness.  Skin: Negative for rash.  Neurological: Negative for weakness.  Hematological: Negative for adenopathy.  Psychiatric/Behavioral: Negative for confusion.  All other systems reviewed and are negative.     Allergies  Fish-derived products and Shrimp  Home Medications   Prior to Admission medications   Medication Sig Start Date End Date Taking? Authorizing Provider  aspirin 81 MG tablet Take 81 mg by mouth daily.   Yes Historical Provider, MD  cycloSPORINE (SANDIMMUNE) 100 MG capsule Take 200 mg by mouth  2 (two) times daily. For a 275 mg total dose twice daily.   Yes Historical Provider, MD  cycloSPORINE (SANDIMMUNE) 25 MG capsule Take 75 mg by mouth 2 (two) times daily. For a total dose of 275 mg twice daily   Yes Historical Provider, MD  metoCLOPramide (REGLAN) 10 MG tablet Take 10 mg by mouth 3 (three) times daily.   Yes Historical Provider, MD  mycophenolate (MYFORTIC) 180 MG EC tablet Take 540 mg by mouth 2 (two) times daily.   Yes Historical Provider,  MD  polyethylene glycol (MIRALAX / GLYCOLAX) packet Take 17 g by mouth daily as needed for mild constipation.   Yes Historical Provider, MD   BP 151/73  Pulse 90  Temp(Src) 98.8 F (37.1 C) (Oral)  Resp 18  Ht 5\' 2"  (1.575 m)  Wt 121 lb 4.1 oz (55 kg)  BMI 22.17 kg/m2  SpO2 100%  LMP 11/11/2013 Physical Exam  Nursing note and vitals reviewed. Constitutional: She is oriented to person, place, and time. She appears well-developed and well-nourished.  HENT:  Head: Normocephalic and atraumatic.  Mouth/Throat: Oropharynx is clear and moist.  Eyes: Conjunctivae are normal. No scleral icterus.  Neck: Normal range of motion. Neck supple.  Cardiovascular: Normal rate, regular rhythm, normal heart sounds and intact distal pulses.   No murmur heard. No tachycardia  Pulmonary/Chest: Effort normal and breath sounds normal. No respiratory distress.  Clear and equal breath sounds  Abdominal: Soft. Bowel sounds are normal. She exhibits no distension and no mass. There is tenderness in the suprapubic area and left lower quadrant. There is guarding. There is no rebound. Hernia confirmed negative in the right inguinal area and confirmed negative in the left inguinal area.  Genitourinary: Pelvic exam was performed with patient supine. There is no rash, tenderness, lesion or injury on the right labia. There is no rash, tenderness, lesion or injury on the left labia. Uterus is not deviated, not enlarged, not fixed and not tender. Cervix exhibits no motion tenderness, no discharge and no friability. Right adnexum displays no mass, no tenderness and no fullness. Left adnexum displays no mass, no tenderness and no fullness. No erythema, tenderness or bleeding around the vagina. No foreign body around the vagina. No signs of injury around the vagina. Vaginal discharge ( Thick, white, moderate amount ) found.  Vaginal discharge but is otherwise unremarkable pelvic exam, no cervical motion tenderness, no adnexal  tenderness  Lymphadenopathy:    She has no cervical adenopathy.       Right: No inguinal adenopathy present.       Left: No inguinal adenopathy present.  Neurological: She is alert and oriented to person, place, and time. She exhibits normal muscle tone. Coordination normal.  Skin: Skin is warm and dry. No erythema.  Psychiatric: She has a normal mood and affect.    ED Course  Procedures (including critical care time) Labs Review Labs Reviewed  WET PREP, GENITAL - Abnormal; Notable for the following:    WBC, Wet Prep HPF POC MANY (*)    All other components within normal limits  CBC - Abnormal; Notable for the following:    Platelets 146 (*)    All other components within normal limits  COMPREHENSIVE METABOLIC PANEL - Abnormal; Notable for the following:    BUN 30 (*)    Alkaline Phosphatase 161 (*)    GFR calc non Af Amer 69 (*)    GFR calc Af Amer 80 (*)    All other components within  normal limits  URINALYSIS, ROUTINE W REFLEX MICROSCOPIC - Abnormal; Notable for the following:    APPearance CLOUDY (*)    Leukocytes, UA MODERATE (*)    All other components within normal limits  URINE MICROSCOPIC-ADD ON - Abnormal; Notable for the following:    Squamous Epithelial / LPF MANY (*)    Bacteria, UA MANY (*)    All other components within normal limits  GC/CHLAMYDIA PROBE AMP  LIPASE, BLOOD  POC URINE PREG, ED    Imaging Review Dg Abd Acute W/chest  12/20/2013   CLINICAL DATA:  Abdominal pain starting yesterday. Previous history of chronic abdominal pain. Previous bowel obstructions.  EXAM: ACUTE ABDOMEN SERIES (ABDOMEN 2 VIEW & CHEST 1 VIEW)  COMPARISON:  12/16/2013  FINDINGS: Stool-filled colon. Surgical clips in the mid abdomen. There is no evidence of dilated bowel loops or free intraperitoneal air. No radiopaque calculi or other significant radiographic abnormality is seen. Heart size and mediastinal contours are within normal limits. Both lungs are clear.  IMPRESSION:  Negative abdominal radiographs.  No acute cardiopulmonary disease.   Electronically Signed   By: Lucienne Capers M.D.   On: 12/20/2013 23:19     EKG Interpretation None      MDM   Final diagnoses:  UTI (lower urinary tract infection)  Abdominal pain   Naelani Issa sense with left lower quadrant abdominal pain radiating into her vagina with associated hematuria and one bout of emesis beginning at 3 PM today. Patient with history of pancreas and kidney transplant x1. Patient is well-known to the department and presents often for chronic abdominal pain but reports that today's pain is different.  Pelvic exam without evidence of cervical motion tenderness, doubt PID at this time. CBC without leukocytosis and CMP largely unremarkable. Slightly elevated BUN at baseline for patient. UA with possible urinary tract infection.  Patient noted to have moderate leukocytes 7-10 white blood cells and bacteria but it is a contaminated sample. We'll give Rocephin IV and culture. Discussed with Dr. Kathrynn Humble, who recommends CT abd/pelvis with noncontrast.    BP 151/73  Pulse 90  Temp(Src) 98.8 F (37.1 C) (Oral)  Resp 18  Ht 5\' 2"  (1.575 m)  Wt 121 lb 4.1 oz (55 kg)  BMI 22.17 kg/m2  SpO2 100%  LMP 11/11/2013  Dr. Kathrynn Humble will follow CT and dispo accordingly.     Abigail Butts, PA-C 12/21/13 0129   Shared service with midlevel provider. I have personally seen and examined the patient, providing direct face to face care, presenting with the chief complaint of abd pain. Hx of transplant of kidney and pancreas. Pain in the lower quadrants bilaterally. Labs show a UTI. Pt has 0 SIRs criteria. She had a normal pelvic exam.  Physical exam findings include lower quadrant abd tenderness, with no peritoneal signs. CT abd non contrast done, and is showing no acute pathology. PO challenge to be initiated, and pt will be discharged home with keflex if she passes the test, with pcp follow up. I have  reviewed the nursing documentation on past medical history, family history, and social history.   Varney Biles, MD 12/21/13 0411  4:38 AM Repeat exam is better than the initial one, still has tenderness. We went over return precautions. She is requesting some pain meds, which we will provide along with nausea meds and antibiotics. She is to see her doctor on Friday. Transplant was a while back, and so we have not contacted the transplant doctor, in light of normal Cr.  Also, pt has been in the ED for extended period of time and still has no SIRs criteria, and has passed po challenge, and she is reliable, so i think outpatient management is fine and appropriate for her granted strict return precautions have been discussed.  Varney Biles, MD 12/21/13 [place], MD 12/21/13 [phone]

## 2013-12-20 NOTE — ED Notes (Signed)
Pt c/o lower abd pain radiating to vagina, + hematuria and emesis.

## 2013-12-21 ENCOUNTER — Emergency Department (HOSPITAL_COMMUNITY): Payer: Medicare Other

## 2013-12-21 LAB — URINE MICROSCOPIC-ADD ON

## 2013-12-21 LAB — COMPREHENSIVE METABOLIC PANEL
ALT: 16 U/L (ref 0–35)
AST: 13 U/L (ref 0–37)
Albumin: 4 g/dL (ref 3.5–5.2)
Alkaline Phosphatase: 161 U/L — ABNORMAL HIGH (ref 39–117)
BUN: 30 mg/dL — ABNORMAL HIGH (ref 6–23)
CO2: 25 mEq/L (ref 19–32)
CREATININE: 1.06 mg/dL (ref 0.50–1.10)
Calcium: 10.1 mg/dL (ref 8.4–10.5)
Chloride: 103 mEq/L (ref 96–112)
GFR calc Af Amer: 80 mL/min — ABNORMAL LOW (ref >90)
GFR calc non Af Amer: 69 mL/min — ABNORMAL LOW (ref >90)
Glucose, Bld: 84 mg/dL (ref 70–99)
POTASSIUM: 4.1 meq/L (ref 3.7–5.3)
Sodium: 141 mEq/L (ref 137–147)
Total Bilirubin: 0.3 mg/dL (ref 0.3–1.2)
Total Protein: 8.2 g/dL (ref 6.0–8.3)

## 2013-12-21 LAB — URINALYSIS, ROUTINE W REFLEX MICROSCOPIC
Bilirubin Urine: NEGATIVE
Glucose, UA: NEGATIVE mg/dL
Hgb urine dipstick: NEGATIVE
Ketones, ur: NEGATIVE mg/dL
Nitrite: NEGATIVE
PROTEIN: NEGATIVE mg/dL
Specific Gravity, Urine: 1.024 (ref 1.005–1.030)
Urobilinogen, UA: 0.2 mg/dL (ref 0.0–1.0)
pH: 5.5 (ref 5.0–8.0)

## 2013-12-21 LAB — LIPASE, BLOOD: LIPASE: 23 U/L (ref 11–59)

## 2013-12-21 LAB — CBC
HCT: 38.9 % (ref 36.0–46.0)
Hemoglobin: 13.5 g/dL (ref 12.0–15.0)
MCH: 30.7 pg (ref 26.0–34.0)
MCHC: 34.7 g/dL (ref 30.0–36.0)
MCV: 88.4 fL (ref 78.0–100.0)
Platelets: 146 10*3/uL — ABNORMAL LOW (ref 150–400)
RBC: 4.4 MIL/uL (ref 3.87–5.11)
RDW: 12.7 % (ref 11.5–15.5)
WBC: 4 10*3/uL (ref 4.0–10.5)

## 2013-12-21 LAB — WET PREP, GENITAL
Clue Cells Wet Prep HPF POC: NONE SEEN
TRICH WET PREP: NONE SEEN
YEAST WET PREP: NONE SEEN

## 2013-12-21 IMAGING — CT CT ABD-PELV W/O CM
1 of 2 series · 15 of 32 positions shown, 19 images · non-contrast
Comparison: [DATE]

CLINICAL DATA: Lower abdominal pain radiating to the vaginal area.
Nausea and vomiting. History of kidney pancreas transplant in [D0].
Gastroparesis.

EXAM:
CT ABDOMEN AND PELVIS WITHOUT CONTRAST
TECHNIQUE: Multidetector CT imaging of the abdomen and pelvis was performed
following the standard protocol without intravenous contrast.

[Series 2: abd/pel w/o · axial · non-contrast · 0.70mm/px · z∈[-626,-210]mm · 15 of 91 slices shown, 19 images]
[im 4/91  soft-tissue]
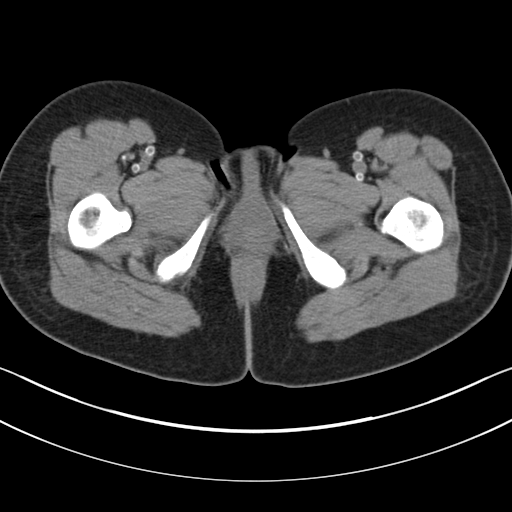
[im 4/91  bone]
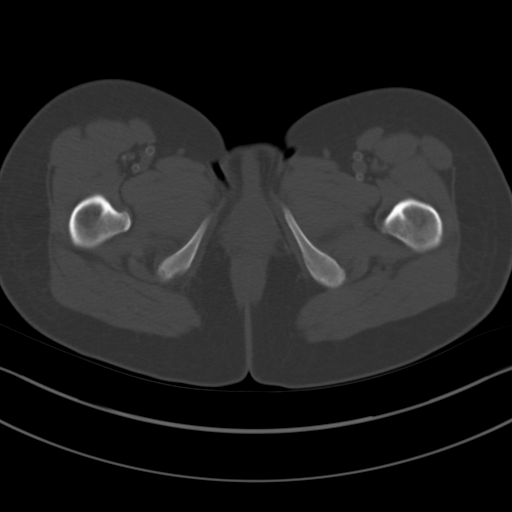
[im 11/91  soft-tissue]
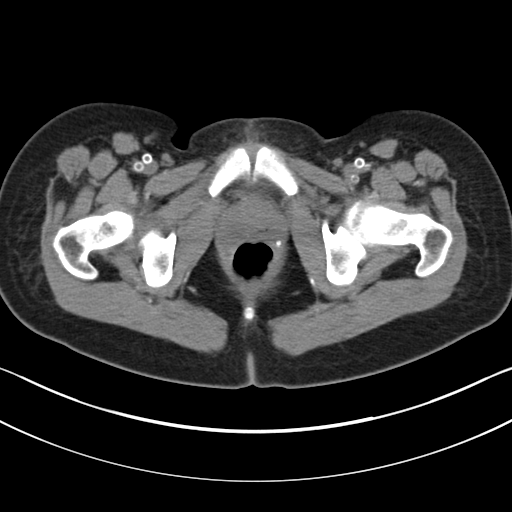
[im 19/91  soft-tissue]
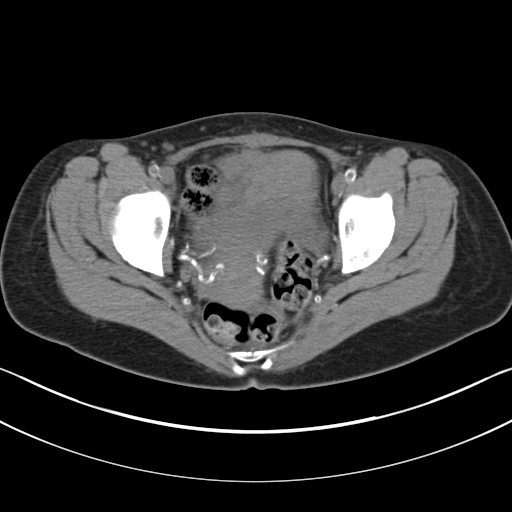
[im 26/91  soft-tissue]
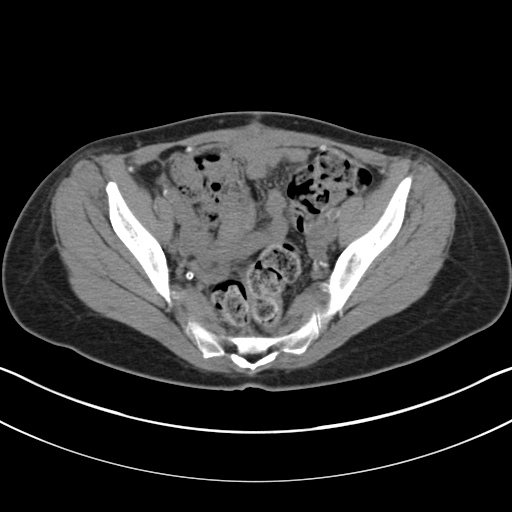
[im 33/91  soft-tissue]
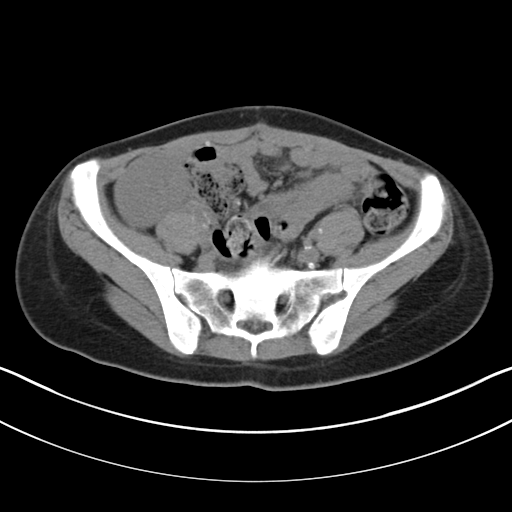
[im 40/91  soft-tissue]
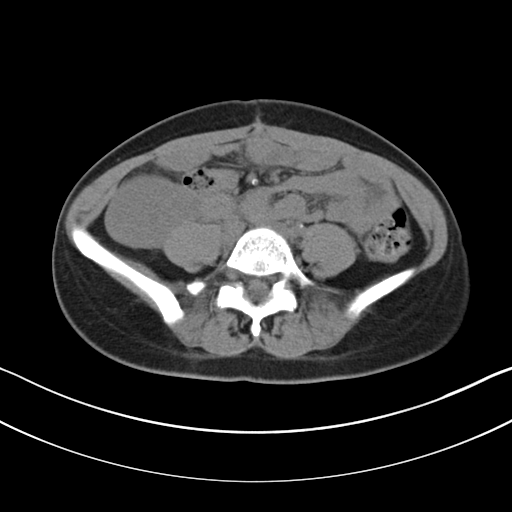
[im 47/91  soft-tissue]
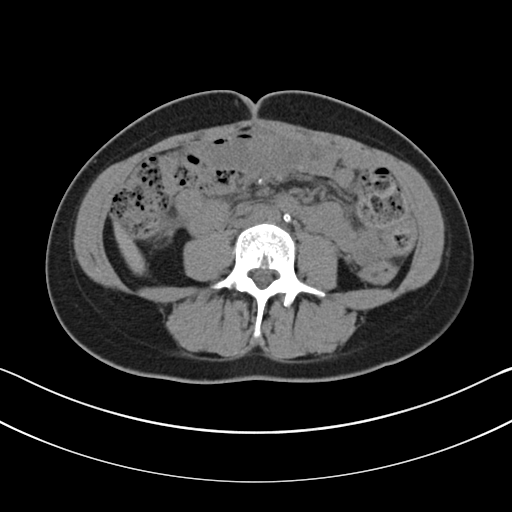
[im 51/91  soft-tissue]
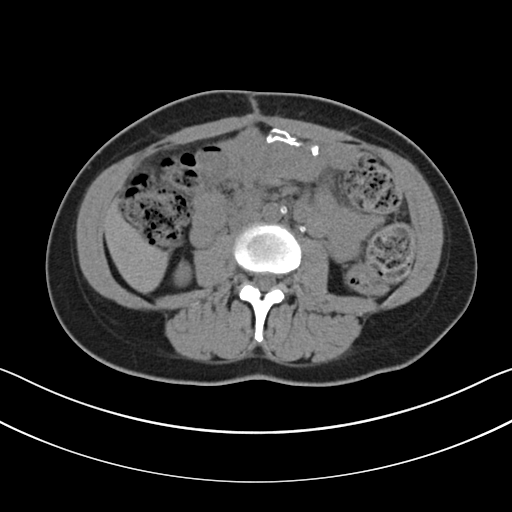
[im 58/91  soft-tissue]
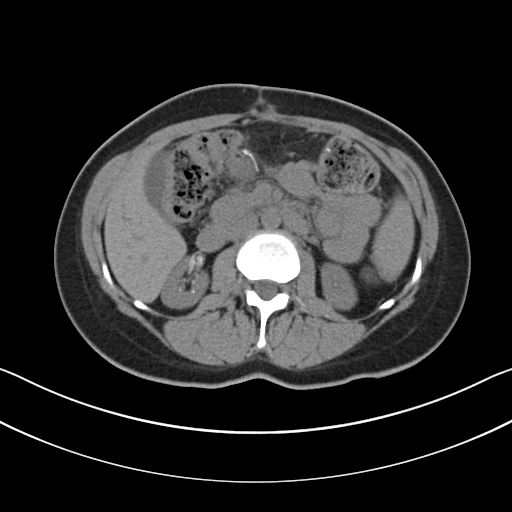
[im 58/91  bone]
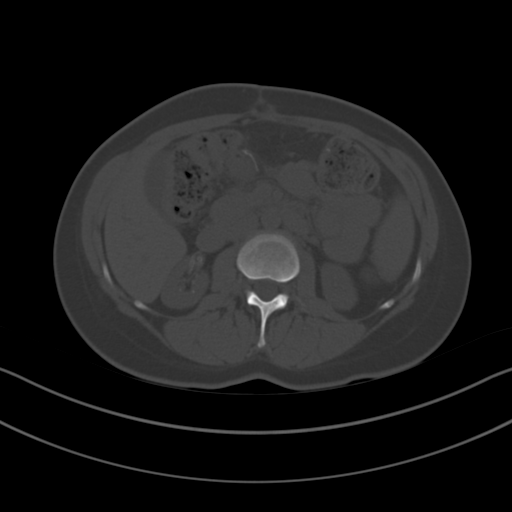
[im 65/91  soft-tissue]
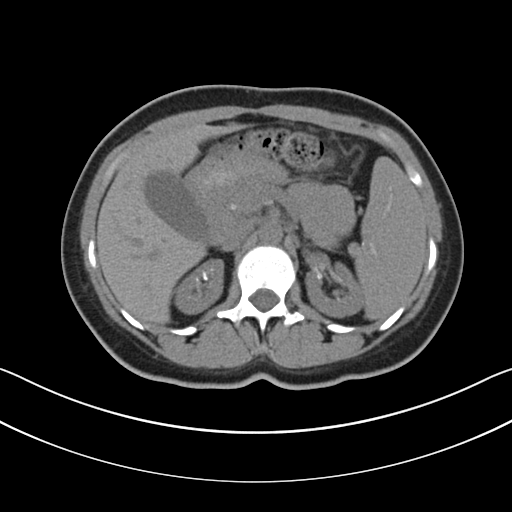
[im 73/91  soft-tissue]
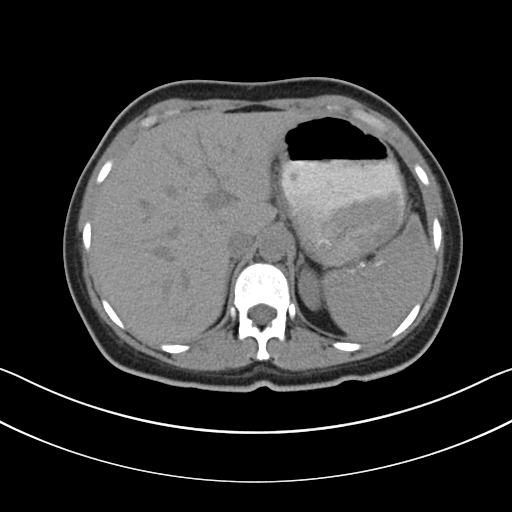
[im 76/91  lung]
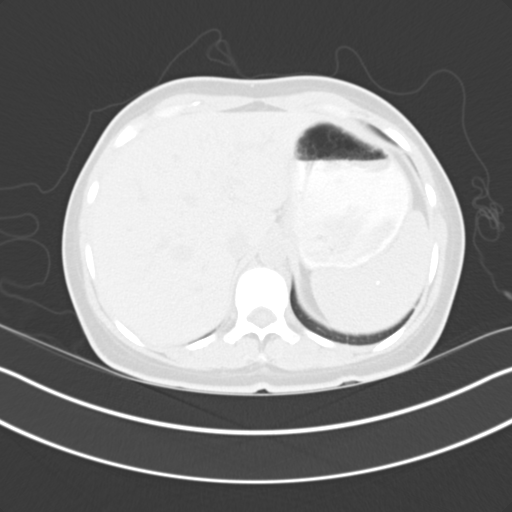
[im 80/91  soft-tissue]
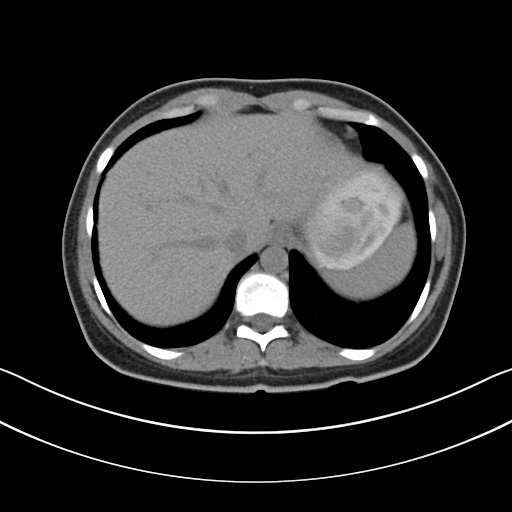
[im 80/91  lung]
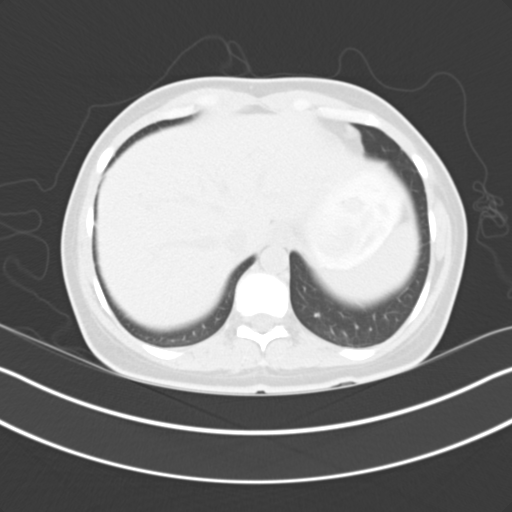
[im 83/91  lung]
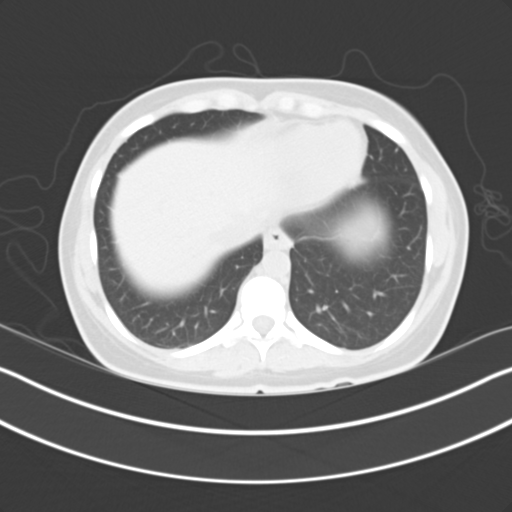
[im 87/91  soft-tissue]
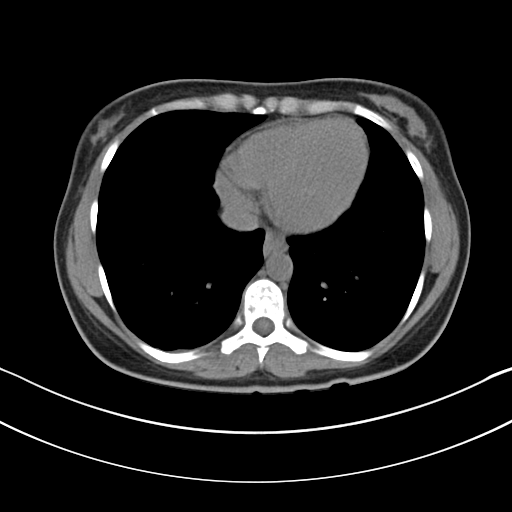
[im 87/91  lung]
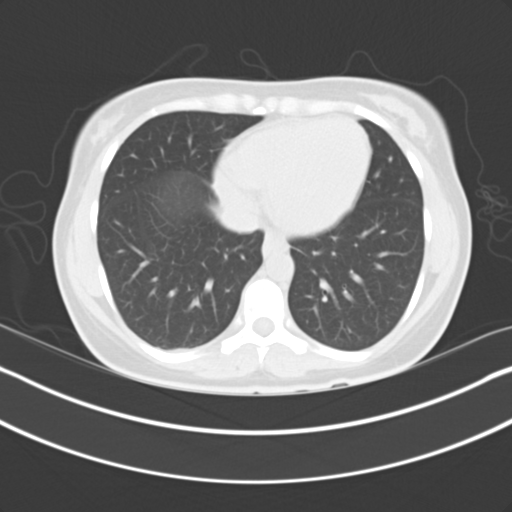

[15 of 32 positions shown; findings below may reference images not displayed]

FINDINGS: The lung bases are clear.

Lack of IV contrast material limits evaluation of vascular
structures and solid organs.

There is extensive vascular calcification involving the renal
arteries, hepatic splenic arteries, and mesenteric arteries.
Postoperative changes in the upper abdomen. Calcified granulomas in
the spleen. Unenhanced appearance of the liver, gallbladder,
pancreas, adrenal glands, abdominal aorta, inferior vena cava, and
retroperitoneal lymph nodes is unremarkable. The stomach and small
bowel are decompressed and difficult to evaluate. The colon is stool
filled without distention. No free air or free fluid in the abdomen

Pelvis: No pelvic mass lesions are demonstrated. Right pelvic
transplant kidney is present. Bladder wall is not thickened. No free
or loculated pelvic fluid collections. No destructive bone lesions.
IMPRESSION: No acute process demonstrated in the abdomen or pelvis. Ureteral
stent is been removed since prior study. Otherwise stable appearance
since previous study.

## 2013-12-21 MED ORDER — PROMETHAZINE HCL 25 MG PO TABS: 25.0000 mg | ORAL_TABLET | Freq: Four times a day (QID) | ORAL | Status: DC | PRN

## 2013-12-21 MED ORDER — CEPHALEXIN 500 MG PO CAPS: 500.0000 mg | ORAL_CAPSULE | Freq: Four times a day (QID) | ORAL | Status: DC

## 2013-12-21 MED ORDER — PROMETHAZINE HCL 25 MG RE SUPP: 25.0000 mg | Freq: Four times a day (QID) | RECTAL | Status: DC | PRN

## 2013-12-21 MED ORDER — ONDANSETRON HCL 4 MG/2ML IJ SOLN
4.0000 mg | Freq: Once | INTRAMUSCULAR | Status: AC
  Administered 2013-12-21: 4 mg via INTRAVENOUS
  Filled 2013-12-21: qty 2

## 2013-12-21 MED ORDER — PROMETHAZINE HCL 25 MG/ML IJ SOLN
25.0000 mg | Freq: Once | INTRAMUSCULAR | Status: AC
  Administered 2013-12-21: 25 mg via INTRAVENOUS
  Filled 2013-12-21: qty 1

## 2013-12-21 MED ORDER — CEFTRIAXONE SODIUM 1 G IJ SOLR
1.0000 g | Freq: Once | INTRAMUSCULAR | Status: AC
  Administered 2013-12-21: 1 g via INTRAVENOUS
  Filled 2013-12-21: qty 10

## 2013-12-21 MED ORDER — IOHEXOL 300 MG/ML  SOLN: 50.0000 mL | Freq: Once | INTRAMUSCULAR | Status: DC | PRN

## 2013-12-21 MED ORDER — IOHEXOL 300 MG/ML  SOLN
50.0000 mL | Freq: Once | INTRAMUSCULAR | Status: AC | PRN
  Administered 2013-12-21: 50 mL via ORAL

## 2013-12-21 MED ORDER — HYDROMORPHONE HCL PF 1 MG/ML IJ SOLN
1.0000 mg | Freq: Once | INTRAMUSCULAR | Status: AC
  Administered 2013-12-21: 1 mg via INTRAVENOUS
  Filled 2013-12-21: qty 1

## 2013-12-21 MED ORDER — OXYCODONE-ACETAMINOPHEN 5-325 MG PO TABS
1.0000 | ORAL_TABLET | Freq: Once | ORAL | Status: AC
  Administered 2013-12-21: 1 via ORAL
  Filled 2013-12-21: qty 1

## 2013-12-21 MED ORDER — OXYCODONE-ACETAMINOPHEN 5-325 MG PO TABS: 1.0000 | ORAL_TABLET | ORAL | Status: DC | PRN

## 2013-12-21 NOTE — ED Notes (Signed)
Pt vomited a large amount after drinking oral contrast, she is also in pain,  Will inform MD

## 2013-12-21 NOTE — ED Notes (Signed)
Pt walked to nurses station requesting more pain medicine.

## 2013-12-21 NOTE — ED Notes (Signed)
Pt has contacted boyfriend, he is coming to pick her up soon.

## 2013-12-21 NOTE — Discharge Instructions (Signed)
Please return to the ER if your symptoms worsen; you have increased pain, fevers, chills, inability to keep any medications down, confusion. Otherwise see the outpatient doctor as requested.   Pelvic Pain, Female Female pelvic pain can be caused by many different things and start from a variety of places. Pelvic pain refers to pain that is located in the lower half of the abdomen and between your hips. The pain may occur over a short period of time (acute) or may be reoccurring (chronic). The cause of pelvic pain may be related to disorders affecting the female reproductive organs (gynecologic), but it may also be related to the bladder, kidney stones, an intestinal complication, or muscle or skeletal problems. Getting help right away for pelvic pain is important, especially if there has been severe, sharp, or a sudden onset of unusual pain. It is also important to get help right away because some types of pelvic pain can be life threatening.  CAUSES  Below are only some of the causes of pelvic pain. The causes of pelvic pain can be in one of several categories.   Gynecologic.  Pelvic inflammatory disease.  Sexually transmitted infection.  Ovarian cyst or a twisted ovarian ligament (ovarian torsion).  Uterine lining that grows outside the uterus (endometriosis).  Fibroids, cysts, or tumors.  Ovulation.  Pregnancy.  Pregnancy that occurs outside the uterus (ectopic pregnancy).  Miscarriage.  Labor.  Abruption of the placenta or ruptured uterus.  Infection.  Uterine infection (endometritis).  Bladder infection.  Diverticulitis.  Miscarriage related to a uterine infection (septic abortion).  Bladder.  Inflammation of the bladder (cystitis).  Kidney stone(s).  Gastrointenstinal.  Constipation.  Diverticulitis.  Neurologic.  Trauma.  Feeling pelvic pain because of mental or emotional causes (psychosomatic).  Cancers of the bowel or pelvis. EVALUATION  Your  caregiver will want to take a careful history of your concerns. This includes recent changes in your health, a careful gynecologic history of your periods (menses), and a sexual history. Obtaining your family history and medical history is also important. Your caregiver may suggest a pelvic exam. A pelvic exam will help identify the location and severity of the pain. It also helps in the evaluation of which organ system may be involved. In order to identify the cause of the pelvic pain and be properly treated, your caregiver may order tests. These tests may include:   A pregnancy test.  Pelvic ultrasonography.  An X-ray exam of the abdomen.  A urinalysis or evaluation of vaginal discharge.  Blood tests. HOME CARE INSTRUCTIONS   Only take over-the-counter or prescription medicines for pain, discomfort, or fever as directed by your caregiver.   Rest as directed by your caregiver.   Eat a balanced diet.   Drink enough fluids to make your urine clear or pale yellow, or as directed.   Avoid sexual intercourse if it causes pain.   Apply warm or cold compresses to the lower abdomen depending on which one helps the pain.   Avoid stressful situations.   Keep a journal of your pelvic pain. Write down when it started, where the pain is located, and if there are things that seem to be associated with the pain, such as food or your menstrual cycle.  Follow up with your caregiver as directed.  SEEK MEDICAL CARE IF:  Your medicine does not help your pain.  You have abnormal vaginal discharge. SEEK IMMEDIATE MEDICAL CARE IF:   You have heavy bleeding from the vagina.   Your pelvic  pain increases.   You feel lightheaded or faint.   You have chills.   You have pain with urination or blood in your urine.   You have uncontrolled diarrhea or vomiting.   You have a fever or persistent symptoms for more than 3 days.  You have a fever and your symptoms suddenly get worse.    You are being physically or sexually abused.  MAKE SURE YOU:  Understand these instructions.  Will watch your condition.  Will get help if you are not doing well or get worse. Document Released: 07/18/2004 Document Revised: 02/20/2012 Document Reviewed: 12/11/2011 [facility] Patient Information 2014 [place], [place].  Urinary Tract Infection Urinary tract infections (UTIs) can develop anywhere along your urinary tract. Your urinary tract is your body's drainage system for removing wastes and extra water. Your urinary tract includes two kidneys, two ureters, a bladder, and a urethra. Your kidneys are a pair of bean-shaped organs. Each kidney is about the size of your fist. They are located below your ribs, one on each side of your spine. CAUSES Infections are caused by microbes, which are microscopic organisms, including fungi, viruses, and bacteria. These organisms are so small that they can only be seen through a microscope. Bacteria are the microbes that most commonly cause UTIs. SYMPTOMS  Symptoms of UTIs may vary by age and gender of the patient and by the location of the infection. Symptoms in young women typically include a frequent and intense urge to urinate and a painful, burning feeling in the bladder or urethra during urination. Older women and men are more likely to be tired, shaky, and weak and have muscle aches and abdominal pain. A fever may mean the infection is in your kidneys. Other symptoms of a kidney infection include pain in your back or sides below the ribs, nausea, and vomiting. DIAGNOSIS To diagnose a UTI, your caregiver will ask you about your symptoms. Your caregiver also will ask to provide a urine sample. The urine sample will be tested for bacteria and white blood cells. White blood cells are made by your body to help fight infection. TREATMENT  Typically, UTIs can be treated with medication. Because most UTIs are caused by a bacterial infection, they usually can  be treated with the use of antibiotics. The choice of antibiotic and length of treatment depend on your symptoms and the type of bacteria causing your infection. HOME CARE INSTRUCTIONS  If you were prescribed antibiotics, take them exactly as your caregiver instructs you. Finish the medication even if you feel better after you have only taken some of the medication.  Drink enough water and fluids to keep your urine clear or pale yellow.  Avoid caffeine, tea, and carbonated beverages. They tend to irritate your bladder.  Empty your bladder often. Avoid holding urine for long periods of time.  Empty your bladder before and after sexual intercourse.  After a bowel movement, women should cleanse from front to back. Use each tissue only once. SEEK MEDICAL CARE IF:   You have back pain.  You develop a fever.  Your symptoms do not begin to resolve within 3 days. SEEK IMMEDIATE MEDICAL CARE IF:   You have severe back pain or lower abdominal pain.  You develop chills.  You have nausea or vomiting.  You have continued burning or discomfort with urination. MAKE SURE YOU:   Understand these instructions.  Will watch your condition.  Will get help right away if you are not doing well or get worse.  Document Released: 05/31/2005 Document Revised: 02/20/2012 Document Reviewed: 09/29/2011 [facility] Patient Information 2014 [place]. Pyelonephritis, Adult Pyelonephritis is a kidney infection. In general, there are 2 main types of pyelonephritis:  Infections that come on quickly without any warning (acute pyelonephritis).  Infections that persist for a long period of time (chronic pyelonephritis). CAUSES  Two main causes of pyelonephritis are:  Bacteria traveling from the bladder to the kidney. This is a problem especially in pregnant women. The urine in the bladder can become filled with bacteria from multiple causes, including:  Inflammation of the prostate gland  (prostatitis).  Sexual intercourse in females.  Bladder infection (cystitis).  Bacteria traveling from the bloodstream to the tissue part of the kidney. Problems that may increase your risk of getting a kidney infection include:  Diabetes.  Kidney stones or bladder stones.  Cancer.  Catheters placed in the bladder.  Other abnormalities of the kidney or ureter. SYMPTOMS   Abdominal pain.  Pain in the side or flank area.  Fever.  Chills.  Upset stomach.  Blood in the urine (dark urine).  Frequent urination.  Strong or persistent urge to urinate.  Burning or stinging when urinating. DIAGNOSIS  Your caregiver may diagnose your kidney infection based on your symptoms. A urine sample may also be taken. TREATMENT  In general, treatment depends on how severe the infection is.   If the infection is mild and caught early, your caregiver may treat you with oral antibiotics and send you home.  If the infection is more severe, the bacteria may have gotten into the bloodstream. This will require intravenous (IV) antibiotics and a hospital stay. Symptoms may include:  High fever.  Severe flank pain.  Shaking chills.  Even after a hospital stay, your caregiver may require you to be on oral antibiotics for a period of time.  Other treatments may be required depending upon the cause of the infection. HOME CARE INSTRUCTIONS   Take your antibiotics as directed. Finish them even if you start to feel better.  Make an appointment to have your urine checked to make sure the infection is gone.  Drink enough fluids to keep your urine clear or pale yellow.  Take medicines for the bladder if you have urgency and frequency of urination as directed by your caregiver. SEEK IMMEDIATE MEDICAL CARE IF:   You have a fever or persistent symptoms for more than 2-3 days.  You have a fever and your symptoms suddenly get worse.  You are unable to take your antibiotics or fluids.  You  develop shaking chills.  You experience extreme weakness or fainting.  There is no improvement after 2 days of treatment. MAKE SURE YOU:  Understand these instructions.  Will watch your condition.  Will get help right away if you are not doing well or get worse. Document Released: 08/21/2005 Document Revised: 02/20/2012 Document Reviewed: 01/25/2011 [facility] Patient Information 2014 [place], [place].

## 2013-12-22 LAB — GC/CHLAMYDIA PROBE AMP
CT PROBE, AMP APTIMA: NEGATIVE
GC Probe RNA: NEGATIVE

## 2013-12-24 ENCOUNTER — Emergency Department (HOSPITAL_COMMUNITY)
Admission: EM | Admit: 2013-12-24 | Discharge: 2013-12-24 | Disposition: A | Discharge status: Home / Self Care | Payer: Medicare Other | Attending: Emergency Medicine | Admitting: Emergency Medicine

## 2013-12-24 ENCOUNTER — Encounter (HOSPITAL_COMMUNITY): Payer: Self-pay | Admitting: Emergency Medicine

## 2013-12-24 DIAGNOSIS — Z8659 Personal history of other mental and behavioral disorders: Secondary | ICD-10-CM | POA: Insufficient documentation

## 2013-12-24 DIAGNOSIS — Z7982 Long term (current) use of aspirin: Secondary | ICD-10-CM | POA: Insufficient documentation

## 2013-12-24 DIAGNOSIS — N83202 Unspecified ovarian cyst, left side: Secondary | ICD-10-CM

## 2013-12-24 DIAGNOSIS — Z87448 Personal history of other diseases of urinary system: Secondary | ICD-10-CM | POA: Insufficient documentation

## 2013-12-24 DIAGNOSIS — N83209 Unspecified ovarian cyst, unspecified side: Secondary | ICD-10-CM | POA: Insufficient documentation

## 2013-12-24 DIAGNOSIS — Z87891 Personal history of nicotine dependence: Secondary | ICD-10-CM | POA: Insufficient documentation

## 2013-12-24 DIAGNOSIS — Z8719 Personal history of other diseases of the digestive system: Secondary | ICD-10-CM | POA: Insufficient documentation

## 2013-12-24 DIAGNOSIS — Z79899 Other long term (current) drug therapy: Secondary | ICD-10-CM | POA: Insufficient documentation

## 2013-12-24 DIAGNOSIS — G8929 Other chronic pain: Secondary | ICD-10-CM | POA: Insufficient documentation

## 2013-12-24 DIAGNOSIS — Z792 Long term (current) use of antibiotics: Secondary | ICD-10-CM | POA: Insufficient documentation

## 2013-12-24 DIAGNOSIS — R109 Unspecified abdominal pain: Secondary | ICD-10-CM

## 2013-12-24 LAB — URINALYSIS, ROUTINE W REFLEX MICROSCOPIC
Bilirubin Urine: NEGATIVE
Glucose, UA: NEGATIVE mg/dL
Hgb urine dipstick: NEGATIVE
Ketones, ur: NEGATIVE mg/dL
Leukocytes, UA: NEGATIVE
NITRITE: NEGATIVE
PROTEIN: NEGATIVE mg/dL
SPECIFIC GRAVITY, URINE: 1.019 (ref 1.005–1.030)
UROBILINOGEN UA: 0.2 mg/dL (ref 0.0–1.0)
pH: 5 (ref 5.0–8.0)

## 2013-12-24 LAB — COMPREHENSIVE METABOLIC PANEL
ALT: 19 U/L (ref 0–35)
AST: 22 U/L (ref 0–37)
Albumin: 3.7 g/dL (ref 3.5–5.2)
Alkaline Phosphatase: 176 U/L — ABNORMAL HIGH (ref 39–117)
BUN: 30 mg/dL — AB (ref 6–23)
CALCIUM: 9.9 mg/dL (ref 8.4–10.5)
CO2: 21 mEq/L (ref 19–32)
Chloride: 105 mEq/L (ref 96–112)
Creatinine, Ser: 1.13 mg/dL — ABNORMAL HIGH (ref 0.50–1.10)
GFR calc non Af Amer: 64 mL/min — ABNORMAL LOW (ref >90)
GFR, EST AFRICAN AMERICAN: 74 mL/min — AB (ref >90)
GLUCOSE: 85 mg/dL (ref 70–99)
Potassium: 5 mEq/L (ref 3.7–5.3)
Sodium: 140 mEq/L (ref 137–147)
Total Bilirubin: 0.2 mg/dL — ABNORMAL LOW (ref 0.3–1.2)
Total Protein: 8.2 g/dL (ref 6.0–8.3)

## 2013-12-24 LAB — CBC WITH DIFFERENTIAL/PLATELET
Basophils Absolute: 0 10*3/uL (ref 0.0–0.1)
Basophils Relative: 0 % (ref 0–1)
EOS PCT: 6 % — AB (ref 0–5)
Eosinophils Absolute: 0.3 10*3/uL (ref 0.0–0.7)
HEMATOCRIT: 38.5 % (ref 36.0–46.0)
HEMOGLOBIN: 13.3 g/dL (ref 12.0–15.0)
LYMPHS ABS: 0.5 10*3/uL — AB (ref 0.7–4.0)
Lymphocytes Relative: 10 % — ABNORMAL LOW (ref 12–46)
MCH: 30.6 pg (ref 26.0–34.0)
MCHC: 34.5 g/dL (ref 30.0–36.0)
MCV: 88.5 fL (ref 78.0–100.0)
MONOS PCT: 8 % (ref 3–12)
Monocytes Absolute: 0.4 10*3/uL (ref 0.1–1.0)
Neutro Abs: 3.4 10*3/uL (ref 1.7–7.7)
Neutrophils Relative %: 76 % (ref 43–77)
PLATELETS: 136 10*3/uL — AB (ref 150–400)
RBC: 4.35 MIL/uL (ref 3.87–5.11)
RDW: 12.3 % (ref 11.5–15.5)
WBC: 4.5 10*3/uL (ref 4.0–10.5)

## 2013-12-24 LAB — WET PREP, GENITAL
TRICH WET PREP: NONE SEEN
YEAST WET PREP: NONE SEEN

## 2013-12-24 LAB — LIPASE, BLOOD: LIPASE: 23 U/L (ref 11–59)

## 2013-12-24 MED ORDER — SODIUM CHLORIDE 0.9 % IV BOLUS (SEPSIS)
1000.0000 mL | Freq: Once | INTRAVENOUS | Status: AC
  Administered 2013-12-24: 1000 mL via INTRAVENOUS

## 2013-12-24 MED ORDER — MORPHINE SULFATE 4 MG/ML IJ SOLN
4.0000 mg | Freq: Once | INTRAMUSCULAR | Status: AC
  Administered 2013-12-24: 4 mg via INTRAVENOUS
  Filled 2013-12-24: qty 1

## 2013-12-24 MED ORDER — HYDROMORPHONE HCL PF 1 MG/ML IJ SOLN
1.0000 mg | Freq: Once | INTRAMUSCULAR | Status: AC
  Administered 2013-12-24: 1 mg via INTRAVENOUS
  Filled 2013-12-24: qty 1

## 2013-12-24 MED ORDER — ONDANSETRON HCL 4 MG/2ML IJ SOLN
4.0000 mg | Freq: Once | INTRAMUSCULAR | Status: AC
  Administered 2013-12-24: 4 mg via INTRAVENOUS
  Filled 2013-12-24: qty 2

## 2013-12-24 MED ORDER — SODIUM CHLORIDE 0.9 % IV BOLUS (SEPSIS)
500.0000 mL | Freq: Once | INTRAVENOUS | Status: AC
  Administered 2013-12-24: 500 mL via INTRAVENOUS

## 2013-12-24 MED ORDER — MORPHINE SULFATE 4 MG/ML IJ SOLN
4.0000 mg | Freq: Once | INTRAMUSCULAR | Status: DC
  Filled 2013-12-24: qty 1

## 2013-12-24 NOTE — ED Notes (Signed)
Pt sts her last pain pill was at 1pm

## 2013-12-24 NOTE — ED Notes (Signed)
Bed: WA01 Expected date: 12/24/13 Expected time: 7:56 PM Means of arrival: Ambulance Comments: abd pain

## 2013-12-24 NOTE — ED Notes (Signed)
Pt does not want morphine at this time. Requesting dilaudid and phenergan.

## 2013-12-24 NOTE — Discharge Instructions (Signed)
1. Medications: usual home medications 2. Treatment: rest, drink plenty of fluids,  3. Follow Up: Please followup with your primary doctor for discussion of your diagnoses and further evaluation after today's visit;

## 2013-12-24 NOTE — ED Provider Notes (Signed)
CSN: [id number]     Arrival date & time 12/24/13  2008 History   First MD Initiated Contact with Patient 12/24/13 2031     Chief Complaint  Patient presents with  . Abdominal Pain     (Consider location/radiation/quality/duration/timing/severity/associated sxs/prior Treatment) The history is provided by the patient and medical records. No language interpreter was used.    [name] [name] is a 33 y.o. female  with a hx ofkidney and pancrease transplant followed by [facility] with subsequent chronic abd pain presents presents to the Emergency Department complaining of gradual, persistent, progressively worsening LLQ abd onset 12/22/13. Pt reports she was seen in the ED at [facility] and dx with L ovarian cyst.  She was given PO dilaudid for home pain use.  She reports continued LLQ pain with intermittent nausea and vomiting.  Pt reports no new symptoms.  No aggravating factors and the dilaudid is not longer an alleviating factor.  Pt reports associated hematuria today.  Pt denies fever, chills, headache, neck pain, chest pain, SOB, weakness, dizziness, syncope, dysuria.    Past Medical History  Diagnosis Date  . Vision loss   . Renal disorder   . Anxiety   . Gastroparesis   . Narcotic abuse   . Non compliance with medical treatment    Past Surgical History  Procedure Laterality Date  . Refractive surgery    . Eye surgery    . Av fistula placement  11/17/2011    Procedure: ARTERIOVENOUS (AV) FISTULA CREATION;  Surgeon: Rosetta Posner, MD;  Location: [place];  Service: Vascular;  Laterality: Right;  . Kidney transplant    . Pancrease transplant     Family History  Problem Relation Age of Onset  . Hypertension Father    History  Substance Use Topics  . Smoking status: Former Smoker -- 1 years    Types: Pipe  . Smokeless tobacco: Never Used     Comment: Hooka  . Alcohol Use: No   OB History   Grav Para Term Preterm Abortions TAB SAB Ect Mult Living                 Review of Systems   Constitutional: Negative for fever, diaphoresis, appetite change, fatigue and unexpected weight change.  HENT: Negative for mouth sores and trouble swallowing.   Respiratory: Negative for cough, chest tightness, shortness of breath, wheezing and stridor.   Cardiovascular: Negative for chest pain and palpitations.  Gastrointestinal: Positive for nausea, vomiting and abdominal pain ( LLQ). Negative for diarrhea, constipation, blood in stool, abdominal distention and rectal pain.  Genitourinary: Negative for dysuria, urgency, frequency, hematuria, flank pain and difficulty urinating.  Musculoskeletal: Negative for back pain, neck pain and neck stiffness.  Skin: Negative for rash.  Neurological: Negative for weakness.  Hematological: Negative for adenopathy.  Psychiatric/Behavioral: Negative for confusion.  All other systems reviewed and are negative.     Allergies  Fish-derived products and Shrimp  Home Medications   Prior to Admission medications   Medication Sig Start Date End Date Taking? Authorizing Provider  aspirin 81 MG tablet Take 81 mg by mouth daily.   Yes Historical Provider, MD  cephALEXin (KEFLEX) 500 MG capsule Take 1 capsule (500 mg total) by mouth 4 (four) times daily. 12/21/13  Yes Varney Biles, MD  cycloSPORINE (SANDIMMUNE) 100 MG capsule Take 200 mg by mouth 2 (two) times daily. For a 275 mg total dose twice daily.   Yes Historical Provider, MD  cycloSPORINE (SANDIMMUNE) 25 MG capsule  Take 75 mg by mouth 2 (two) times daily. For a total dose of 275 mg twice daily   Yes Historical Provider, MD  metoCLOPramide (REGLAN) 10 MG tablet Take 10 mg by mouth 3 (three) times daily.   Yes Historical Provider, MD  mycophenolate (MYFORTIC) 180 MG EC tablet Take 540 mg by mouth 2 (two) times daily.   Yes Historical Provider, MD  oxyCODONE-acetaminophen (PERCOCET/ROXICET) 5-325 MG per tablet Take 1 tablet by mouth every 4 (four) hours as needed for severe pain. 12/21/13  Yes Varney Biles, MD  polyethylene glycol (MIRALAX / GLYCOLAX) packet Take 17 g by mouth daily as needed for mild constipation.   Yes Historical Provider, MD  promethazine (PHENERGAN) 25 MG suppository Place 1 suppository (25 mg total) rectally every 6 (six) hours as needed for nausea. 12/21/13  Yes Varney Biles, MD  promethazine (PHENERGAN) 25 MG tablet Take 1 tablet (25 mg total) by mouth every 6 (six) hours as needed for nausea. 12/21/13  Yes Ankit Nanavati, MD   BP 109/71  Pulse 84  Temp(Src) 98.2 F (36.8 C) (Oral)  Resp 16  SpO2 100%  LMP 11/17/2013 Physical Exam  Nursing note and vitals reviewed. Constitutional: She appears well-developed and well-nourished. No distress.  Awake, alert, nontoxic appearance  HENT:  Head: Normocephalic and atraumatic.  Mouth/Throat: Oropharynx is clear and moist. No oropharyngeal exudate.  Eyes: Conjunctivae are normal. No scleral icterus.  Neck: Normal range of motion. Neck supple.  Cardiovascular: Normal rate, regular rhythm, normal heart sounds and intact distal pulses.   No murmur heard. RRR  Pulmonary/Chest: Effort normal and breath sounds normal. No respiratory distress. She has no wheezes.  Clear and equal breath sounds  Abdominal: Soft. Bowel sounds are normal. She exhibits no distension and no mass. There is tenderness in the suprapubic area and left lower quadrant. There is guarding. There is no rebound and no CVA tenderness. Hernia confirmed negative in the right inguinal area and confirmed negative in the left inguinal area.  LLQ and suprapubic abdominal tenderness No CVA tenderness No Rebound or peritoneal signs  Genitourinary: There is no rash, tenderness, lesion or injury on the right labia. There is no rash, tenderness, lesion or injury on the left labia. Uterus is not deviated, not enlarged, not fixed and not tender. Cervix exhibits no motion tenderness, no discharge and no friability. Right adnexum displays no mass, no tenderness and no  fullness. Left adnexum displays tenderness. Left adnexum displays no mass and no fullness. No erythema, tenderness or bleeding around the vagina. No foreign body around the vagina. No signs of injury around the vagina. Vaginal discharge ( small, thin, white) found.  Left adnexal tenderness without masses or fullness No cervical motion tenderness  Musculoskeletal: Normal range of motion. She exhibits no edema.  Lymphadenopathy:       Right: No inguinal adenopathy present.       Left: No inguinal adenopathy present.  Neurological: She is alert.  Speech is clear and goal oriented Moves extremities without ataxia  Skin: Skin is warm and dry. She is not diaphoretic.  Psychiatric: She has a normal mood and affect.    ED Course  Procedures (including critical care time) Labs Review Labs Reviewed  WET PREP, GENITAL - Abnormal; Notable for the following:    Clue Cells Wet Prep HPF POC RARE (*)    WBC, Wet Prep HPF POC MODERATE (*)    All other components within normal limits  CBC WITH DIFFERENTIAL - Abnormal; Notable for  the following:    Platelets 136 (*)    Lymphocytes Relative 10 (*)    Lymphs Abs 0.5 (*)    Eosinophils Relative 6 (*)    All other components within normal limits  COMPREHENSIVE METABOLIC PANEL - Abnormal; Notable for the following:    BUN 30 (*)    Creatinine, Ser 1.13 (*)    Alkaline Phosphatase 176 (*)    Total Bilirubin 0.2 (*)    GFR calc non Af Amer 64 (*)    GFR calc Af Amer 74 (*)    All other components within normal limits  URINALYSIS, ROUTINE W REFLEX MICROSCOPIC - Abnormal; Notable for the following:    APPearance CLOUDY (*)    All other components within normal limits  LIPASE, BLOOD    Imaging Review No results found.   EKG Interpretation None      MDM   Final diagnoses:  Chronic abdominal pain  Left ovarian cyst   [name] [name] presents with LLQ abd pain.  She reports she was seen at [facility] for the same and dx with L ovarian cyst, but has  since run out of her dilaudid and her pain returned.    Pelvic exam with scant discharge, but no CMT.  L adnexal pain without mass of fullness.  Will give pain control, nausea medicine and reassess.    09:23 PM Record reviewed. Patient was evaluated here on 12/21/2013 or the same pain. She was seen at [facility] on 4/21 and again on 4/22 for the same symptoms. Patient given pelvic/abdominal ultrasound and found to have a left ovarian cysts likely causing her pain.   [place] results from 12/23/13 1. Complex cystic lesion in the left ovary, which likely represents a hemorrhagic corpus luteum. Followup ultrasound in 6 to 12 weeks could assess for regression/resolution.  2. Arterial and venous flow are documented by spectral Doppler analysis in both ovaries.  3. Multiple uterine fibroids.  4. Bladder wall thickening, with debris in the bladder. Correlate with urinalysis.  5. Persistent fullness of the right lower quadrant renal transplant collecting system and urothelial thickening, incompletely characterized on this exam.  6. Small volume of free fluid in the pelvis.   10:25 PM Pt requesting repeat pain control.  Will repeat and PO trial.    10:48PM Patient refusing morphine and requesting Dilaudid.  Patient is drinking water without difficulty. No emesis here in the department. We'll repeat pain control and discharge home.  I discussed this with the patient. I will not write for a pain prescription at home. She is to followup with her transplant physician at [place] for further evaluation. She may return here for worsening emesis and dehydration. I discussed with her at length that her pain is chronic.  I have also discussed the pain commonly associated with ovarian cysts. I have explained at length the course of an ovarian cyst. I have recommended that she followup with OB/GYN for this.  It has been determined that no acute conditions requiring further emergency intervention are present at this time.  The patient/guardian have been advised of the diagnosis and plan. We have discussed signs and symptoms that warrant return to the ED, such as changes or worsening in symptoms.   Vital signs are stable at discharge.   BP 109/71  Pulse 84  Temp(Src) 98.2 F (36.8 C) (Oral)  Resp 16  SpO2 100%  LMP 11/17/2013  Patient/guardian has voiced understanding and agreed to follow-up with the PCP or specialist.  Jarrett Soho Ming Mcmannis, PA-C 12/25/13 0126

## 2013-12-24 NOTE — ED Notes (Signed)
Per EMS, Pt was seen by PCP on the 20th for a follow up from when she was here for abdominal pain and urinary symptoms. PCP diagnosed her with an ovarian cyst by ultrasound. Not sure if it is one cyst or multiple. PCP prescribed pain medication. Pt. Has been taking medications and pain was tolerable until earlier today. Pt c/o uncontrollable pain, blood in urine. 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 Pt is recent pancreatic transplant recipient. Since transplant, pt has no longer had issues with diabetes. A&Ox4. Pt has had 3 episodes of vomiting today. Pt denies diarrhea. Pt sts the reglan she is prescribed does not help the N/V.

## 2013-12-26 ENCOUNTER — Encounter (HOSPITAL_COMMUNITY): Payer: Self-pay | Admitting: Emergency Medicine

## 2013-12-26 DIAGNOSIS — G8929 Other chronic pain: Secondary | ICD-10-CM | POA: Insufficient documentation

## 2013-12-26 DIAGNOSIS — Z8719 Personal history of other diseases of the digestive system: Secondary | ICD-10-CM | POA: Insufficient documentation

## 2013-12-26 DIAGNOSIS — Z8669 Personal history of other diseases of the nervous system and sense organs: Secondary | ICD-10-CM | POA: Insufficient documentation

## 2013-12-26 DIAGNOSIS — Z7982 Long term (current) use of aspirin: Secondary | ICD-10-CM | POA: Insufficient documentation

## 2013-12-26 DIAGNOSIS — R52 Pain, unspecified: Secondary | ICD-10-CM | POA: Insufficient documentation

## 2013-12-26 DIAGNOSIS — Z8659 Personal history of other mental and behavioral disorders: Secondary | ICD-10-CM | POA: Insufficient documentation

## 2013-12-26 DIAGNOSIS — Z9119 Patient's noncompliance with other medical treatment and regimen: Secondary | ICD-10-CM | POA: Insufficient documentation

## 2013-12-26 DIAGNOSIS — Z3202 Encounter for pregnancy test, result negative: Secondary | ICD-10-CM | POA: Insufficient documentation

## 2013-12-26 DIAGNOSIS — Z87891 Personal history of nicotine dependence: Secondary | ICD-10-CM | POA: Insufficient documentation

## 2013-12-26 DIAGNOSIS — Z91199 Patient's noncompliance with other medical treatment and regimen due to unspecified reason: Secondary | ICD-10-CM | POA: Insufficient documentation

## 2013-12-26 DIAGNOSIS — N83209 Unspecified ovarian cyst, unspecified side: Secondary | ICD-10-CM | POA: Insufficient documentation

## 2013-12-26 DIAGNOSIS — Z87448 Personal history of other diseases of urinary system: Secondary | ICD-10-CM | POA: Insufficient documentation

## 2013-12-26 DIAGNOSIS — Z79899 Other long term (current) drug therapy: Secondary | ICD-10-CM | POA: Insufficient documentation

## 2013-12-26 LAB — URINE MICROSCOPIC-ADD ON

## 2013-12-26 LAB — COMPREHENSIVE METABOLIC PANEL
ALK PHOS: 161 U/L — AB (ref 39–117)
ALT: 19 U/L (ref 0–35)
AST: 19 U/L (ref 0–37)
Albumin: 3.9 g/dL (ref 3.5–5.2)
BILIRUBIN TOTAL: 0.4 mg/dL (ref 0.3–1.2)
BUN: 27 mg/dL — AB (ref 6–23)
CHLORIDE: 105 meq/L (ref 96–112)
CO2: 23 meq/L (ref 19–32)
CREATININE: 1.05 mg/dL (ref 0.50–1.10)
Calcium: 10.1 mg/dL (ref 8.4–10.5)
GFR calc non Af Amer: 70 mL/min — ABNORMAL LOW (ref >90)
GFR, EST AFRICAN AMERICAN: 81 mL/min — AB (ref >90)
GLUCOSE: 100 mg/dL — AB (ref 70–99)
POTASSIUM: 5 meq/L (ref 3.7–5.3)
Sodium: 141 mEq/L (ref 137–147)
Total Protein: 8.4 g/dL — ABNORMAL HIGH (ref 6.0–8.3)

## 2013-12-26 LAB — URINALYSIS, ROUTINE W REFLEX MICROSCOPIC
Bilirubin Urine: NEGATIVE
Glucose, UA: NEGATIVE mg/dL
HGB URINE DIPSTICK: NEGATIVE
KETONES UR: NEGATIVE mg/dL
Nitrite: NEGATIVE
PH: 5.5 (ref 5.0–8.0)
PROTEIN: NEGATIVE mg/dL
Specific Gravity, Urine: 1.022 (ref 1.005–1.030)
Urobilinogen, UA: 0.2 mg/dL (ref 0.0–1.0)

## 2013-12-26 LAB — CBC WITH DIFFERENTIAL/PLATELET
BASOS PCT: 0 % (ref 0–1)
Basophils Absolute: 0 10*3/uL (ref 0.0–0.1)
Eosinophils Absolute: 0.3 10*3/uL (ref 0.0–0.7)
Eosinophils Relative: 4 % (ref 0–5)
HEMATOCRIT: 40.4 % (ref 36.0–46.0)
Hemoglobin: 14.2 g/dL (ref 12.0–15.0)
LYMPHS ABS: 0.5 10*3/uL — AB (ref 0.7–4.0)
LYMPHS PCT: 8 % — AB (ref 12–46)
MCH: 31.2 pg (ref 26.0–34.0)
MCHC: 35.1 g/dL (ref 30.0–36.0)
MCV: 88.8 fL (ref 78.0–100.0)
MONO ABS: 0.5 10*3/uL (ref 0.1–1.0)
MONOS PCT: 9 % (ref 3–12)
Neutro Abs: 4.5 10*3/uL (ref 1.7–7.7)
Neutrophils Relative %: 79 % — ABNORMAL HIGH (ref 43–77)
Platelets: 168 10*3/uL (ref 150–400)
RBC: 4.55 MIL/uL (ref 3.87–5.11)
RDW: 12.4 % (ref 11.5–15.5)
WBC: 5.8 10*3/uL (ref 4.0–10.5)

## 2013-12-26 LAB — PREGNANCY, URINE: Preg Test, Ur: NEGATIVE

## 2013-12-26 NOTE — ED Provider Notes (Signed)
Medical screening examination/treatment/procedure(s) were performed by non-physician practitioner and as supervising physician I was immediately available for consultation/collaboration.   EKG Interpretation None       Virgel Manifold, MD 12/26/13 [phone]

## 2013-12-26 NOTE — ED Notes (Signed)
Pt. reports low abdominal pain more on the left side onset 1 week ago , seen at [facility] on 12/24/2013 diagnosed with ovarian cyst . Pt. also reported spotting and dysuria .

## 2013-12-27 ENCOUNTER — Emergency Department (HOSPITAL_COMMUNITY)
Admission: EM | Admit: 2013-12-27 | Discharge: 2013-12-27 | Disposition: A | Discharge status: Home / Self Care | Payer: Medicare Other | Attending: Emergency Medicine | Admitting: Emergency Medicine

## 2013-12-27 DIAGNOSIS — N83202 Unspecified ovarian cyst, left side: Secondary | ICD-10-CM

## 2013-12-27 MED ORDER — HYDROMORPHONE HCL PF 1 MG/ML IJ SOLN
0.5000 mg | Freq: Once | INTRAMUSCULAR | Status: AC
  Administered 2013-12-27: 0.5 mg via INTRAMUSCULAR
  Filled 2013-12-27: qty 1

## 2013-12-27 MED ORDER — OXYCODONE-ACETAMINOPHEN 5-325 MG PO TABS
2.0000 | ORAL_TABLET | Freq: Once | ORAL | Status: AC
  Administered 2013-12-27: 2 via ORAL
  Filled 2013-12-27: qty 2

## 2013-12-27 NOTE — ED Notes (Signed)
Upon giving patient her pills, she states "Why can't she just give me something stronger through an IV." "I don't know if I can keep the pills down I feel dehydrated." Pt swallowed pills and then proceeded to cough until she gagged her pills up and said "see I told you I need IV pain meds only."

## 2013-12-27 NOTE — ED Notes (Signed)
Pt called nurse to room. Pt states "can you at least put an IV in me and give me saline?" Pt informed nothing else can be done for her until she is seen by MD.  Pt does not understand why we cannot just give her an IV and some Saline

## 2013-12-27 NOTE — Discharge Instructions (Signed)
It is very important for you to keep your appointment with the gynecologist this upcoming week.  You must fill your prescriptions to help with your pain.   Ovarian Cyst An ovarian cyst is a fluid-filled sac that forms on an ovary. The ovaries are small organs that produce eggs in women. Various types of cysts can form on the ovaries. Most are not cancerous. Many do not cause problems, and they often go away on their own. Some may cause symptoms and require treatment. Common types of ovarian cysts include:  Functional cysts These cysts may occur every month during the menstrual cycle. This is normal. The cysts usually go away with the next menstrual cycle if the woman does not get pregnant. Usually, there are no symptoms with a functional cyst.  Endometrioma cysts These cysts form from the tissue that lines the uterus. They are also called "chocolate cysts" because they become filled with blood that turns brown. This type of cyst can cause pain in the lower abdomen during intercourse and with your menstrual period.  Cystadenoma cysts This type develops from the cells on the outside of the ovary. These cysts can get very big and cause lower abdomen pain and pain with intercourse. This type of cyst can twist on itself, cut off its blood supply, and cause severe pain. It can also easily rupture and cause a lot of pain.  Dermoid cysts This type of cyst is sometimes found in both ovaries. These cysts may contain different kinds of body tissue, such as skin, teeth, hair, or cartilage. They usually do not cause symptoms unless they get very big.  Theca lutein cysts These cysts occur when too much of a certain hormone (human chorionic gonadotropin) is produced and overstimulates the ovaries to produce an egg. This is most common after procedures used to assist with the conception of a baby (in vitro fertilization). CAUSES   Fertility drugs can cause a condition in which multiple large cysts are formed on the  ovaries. This is called ovarian hyperstimulation syndrome.  A condition called polycystic ovary syndrome can cause hormonal imbalances that can lead to nonfunctional ovarian cysts. SIGNS AND SYMPTOMS  Many ovarian cysts do not cause symptoms. If symptoms are present, they may include:  Pelvic pain or pressure.  Pain in the lower abdomen.  Pain during sexual intercourse.  Increasing girth (swelling) of the abdomen.  Abnormal menstrual periods.  Increasing pain with menstrual periods.  Stopping having menstrual periods without being pregnant. DIAGNOSIS  These cysts are commonly found during a routine or annual pelvic exam. Tests may be ordered to find out more about the cyst. These tests may include:  Ultrasound.  X-ray of the pelvis.  CT scan.  MRI.  Blood tests. TREATMENT  Many ovarian cysts go away on their own without treatment. Your health care provider may want to check your cyst regularly for 2 3 months to see if it changes. For women in menopause, it is particularly important to monitor a cyst closely because of the higher rate of ovarian cancer in menopausal women. When treatment is needed, it may include any of the following:  A procedure to drain the cyst (aspiration). This may be done using a long needle and ultrasound. It can also be done through a laparoscopic procedure. This involves using a thin, lighted tube with a tiny camera on the end (laparoscope) inserted through a small incision.  Surgery to remove the whole cyst. This may be done using laparoscopic surgery or an  open surgery involving a larger incision in the lower abdomen.  Hormone treatment or birth control pills. These methods are sometimes used to help dissolve a cyst. HOME CARE INSTRUCTIONS   Only take over-the-counter or prescription medicines as directed by your health care provider.  Follow up with your health care provider as directed.  Get regular pelvic exams and Pap tests. SEEK MEDICAL  CARE IF:   Your periods are late, irregular, or painful, or they stop.  Your pelvic pain or abdominal pain does not go away.  Your abdomen becomes larger or swollen.  You have pressure on your bladder or trouble emptying your bladder completely.  You have pain during sexual intercourse.  You have feelings of fullness, pressure, or discomfort in your stomach.  You lose weight for no apparent reason.  You feel generally ill.  You become constipated.  You lose your appetite.  You develop acne.  You have an increase in body and facial hair.  You are gaining weight, without changing your exercise and eating habits.  You think you are pregnant. SEEK IMMEDIATE MEDICAL CARE IF:   You have increasing abdominal pain.  You feel sick to your stomach (nauseous), and you throw up (vomit).  You develop a fever that comes on suddenly.  You have abdominal pain during a bowel movement.  Your menstrual periods become heavier than usual. Document Released: 08/21/2005 Document Revised: 06/11/2013 Document Reviewed: 04/28/2013 [facility] Patient Information 2014 [place].

## 2013-12-27 NOTE — ED Notes (Addendum)
Pt has been seen multiple times for ovarian cyst. States they told her when her pain got bad again (she just ran out of the 6 oxycodone that she was prescribed three days ago) that they told her to come back to the ED for more. L abdominal pain 10/10 to ovary.

## 2013-12-27 NOTE — ED Provider Notes (Signed)
CSN: [id number]     Arrival date & time 12/26/13  2111 History   First MD Initiated Contact with Patient 12/27/13 0138     Chief Complaint  Patient presents with  . Abdominal Pain     (Consider location/radiation/quality/duration/timing/severity/associated sxs/prior Treatment) HPI 33 year old female presents emergency department from home with complaint of left-sided abdominal pain.  Patient reports pain has been severe and persistent.  She has had nausea and vomiting, but none noted here.  Patient seen earlier today at her transplant clinic.  She reports she received a prescription for Percocet.  At that appointment.  She reports that she does not have the money to afford the prescription.  She reports that the medications that she receives by IV here in the emergency department.  Work much better and last for several days.  Patient was diagnosed with ovarian cyst on April 21 at [facility].  Patient seen back at the [facility]  ED on the 22nd for persistent pain.  Patient was seen again in our emergency department on the 23rd.  Patient reports she has GYN appointment scheduled for next week.  Patient has past medical history of diabetes, renal failure, status post kidney and pancreas transplant Past Medical History  Diagnosis Date  . Vision loss   . Renal disorder   . Anxiety   . Gastroparesis   . Narcotic abuse   . Non compliance with medical treatment    Past Surgical History  Procedure Laterality Date  . Refractive surgery    . Eye surgery    . Av fistula placement  11/17/2011    Procedure: ARTERIOVENOUS (AV) FISTULA CREATION;  Surgeon: Rosetta Posner, MD;  Location: [place];  Service: Vascular;  Laterality: Right;  . Kidney transplant    . Pancrease transplant     Family History  Problem Relation Age of Onset  . Hypertension Father    History  Substance Use Topics  . Smoking status: Former Smoker -- 1 years    Types: Pipe  . Smokeless tobacco: Never Used     Comment: Hooka  .  Alcohol Use: No   OB History   Grav Para Term Preterm Abortions TAB SAB Ect Mult Living                 Review of Systems   See History of Present Illness; otherwise all other systems are reviewed and negative  Allergies  Fish-derived products and Shrimp  Home Medications   Prior to Admission medications   Medication Sig Start Date End Date Taking? Authorizing Provider  aspirin 81 MG tablet Take 81 mg by mouth daily.   Yes Historical Provider, MD  cycloSPORINE (SANDIMMUNE) 100 MG capsule Take 200 mg by mouth 2 (two) times daily. For a 275 mg total dose twice daily.   Yes Historical Provider, MD  cycloSPORINE (SANDIMMUNE) 25 MG capsule Take 75 mg by mouth 2 (two) times daily. For a total dose of 275 mg twice daily   Yes Historical Provider, MD  metoCLOPramide (REGLAN) 10 MG tablet Take 10 mg by mouth 3 (three) times daily.   Yes Historical Provider, MD  mycophenolate (MYFORTIC) 180 MG EC tablet Take 540 mg by mouth 2 (two) times daily.   Yes Historical Provider, MD  oxyCODONE-acetaminophen (PERCOCET/ROXICET) 5-325 MG per tablet Take 1 tablet by mouth every 4 (four) hours as needed for severe pain. 12/21/13  Yes Varney Biles, MD  promethazine (PHENERGAN) 25 MG suppository Place 1 suppository (25 mg total) rectally every 6 (  six) hours as needed for nausea. 12/21/13  Yes Varney Biles, MD  promethazine (PHENERGAN) 25 MG tablet Take 1 tablet (25 mg total) by mouth every 6 (six) hours as needed for nausea. 12/21/13  Yes Ankit Nanavati, MD   BP 134/81  Pulse 90  Temp(Src) 97.2 F (36.2 C) (Oral)  Resp 18  Ht 5\' 2"  (1.575 m)  Wt 120 lb (54.432 kg)  BMI 21.94 kg/m2  SpO2 100%  LMP 11/17/2013 Physical Exam  Nursing note and vitals reviewed. Constitutional: She is oriented to person, place, and time. She appears well-developed and well-nourished. She appears distressed (patient crying out.  Prior to my entrance into the room.  When I had her room, patient asks multiple times if I am the  doctor, and requests strong pain medicines.).  HENT:  Head: Normocephalic and atraumatic.  Nose: Nose normal.  Mouth/Throat: Oropharynx is clear and moist.  Eyes: Conjunctivae and EOM are normal. Pupils are equal, round, and reactive to light.  Neck: Normal range of motion. Neck supple. No JVD present. No tracheal deviation present. No thyromegaly present.  Cardiovascular: Normal rate, regular rhythm, normal heart sounds and intact distal pulses.  Exam reveals no gallop and no friction rub.   No murmur heard. Pulmonary/Chest: Effort normal and breath sounds normal. No stridor. No respiratory distress. She has no wheezes. She has no rales. She exhibits no tenderness.  Abdominal: Soft. Bowel sounds are normal. She exhibits no distension and no mass. There is tenderness (abdominal pain noted with palpation diffusely, worse in left lower quadrant.  No rebound or guarding). There is no rebound and no guarding.  Musculoskeletal: Normal range of motion. She exhibits no edema and no tenderness.  Lymphadenopathy:    She has no cervical adenopathy.  Neurological: She is alert and oriented to person, place, and time. She has normal reflexes. No cranial nerve deficit. She exhibits normal muscle tone. Coordination normal.  Skin: Skin is warm and dry. No rash noted. No erythema. No pallor.  Psychiatric: She has a normal mood and affect. Her behavior is normal. Judgment and thought content normal.    ED Course  Procedures (including critical care time) Labs Review Labs Reviewed  URINALYSIS, ROUTINE W REFLEX MICROSCOPIC - Abnormal; Notable for the following:    Leukocytes, UA MODERATE (*)    All other components within normal limits  CBC WITH DIFFERENTIAL - Abnormal; Notable for the following:    Neutrophils Relative % 79 (*)    Lymphocytes Relative 8 (*)    Lymphs Abs 0.5 (*)    All other components within normal limits  COMPREHENSIVE METABOLIC PANEL - Abnormal; Notable for the following:     Glucose, Bld 100 (*)    BUN 27 (*)    Total Protein 8.4 (*)    Alkaline Phosphatase 161 (*)    GFR calc non Af Amer 70 (*)    GFR calc Af Amer 81 (*)    All other components within normal limits  URINE MICROSCOPIC-ADD ON - Abnormal; Notable for the following:    Squamous Epithelial / LPF FEW (*)    Bacteria, UA FEW (*)    All other components within normal limits  URINE CULTURE  PREGNANCY, URINE    Imaging Review No results found.   EKG Interpretation None      MDM   Final diagnoses:  Ovarian cyst, left    Patient with acute on chronic left lower quadrant pain, diagnosed with ovarian cyst this past week, has been seen  multiple times in the emergency room for pain related complaints.  She does have a complicated past medical history.  Patient was seen today at her transplant clinic, reports she received a new prescription for Percocet.  She has been unable to fill his prescription secondary to not having money.  She reports that she did not fill the prescription of hydrocodone given to her in the ED earlier this week either.  Patient was given Percocet by nursing staff here, reportedly choked and gagged on it, but then later was seen burying the emesis bag in the trash can.  There is concern that she did retrieve the regurgitated pills and took them.  I explained to the patient that her labs do not show any signs of dehydration.  She does have a few white blood cells in her urine, and will be sent for culture.  Records from [facility] reviewed, all recent cultures have been negative.  No ketones in the urine, no increased BUN or creatinine.  Patient does not clinically show any dehydration.  Patient to received an im injection of dilaudid.  Again, she was instructed that she needed to followup with GYN, receive her pain medications from one source, and discuss with her doctor referral to a pain clinic.  Patient reports she has followup this week with GYN.   Kalman Drape, MD 12/27/13  [phone]

## 2013-12-28 LAB — URINE CULTURE

## 2014-01-04 ENCOUNTER — Encounter (HOSPITAL_COMMUNITY): Payer: Self-pay | Admitting: Emergency Medicine

## 2014-01-04 ENCOUNTER — Emergency Department (HOSPITAL_COMMUNITY)
Admission: EM | Admit: 2014-01-04 | Discharge: 2014-01-04 | Disposition: A | Discharge status: Home / Self Care | Payer: Medicare Other | Attending: Emergency Medicine | Admitting: Emergency Medicine

## 2014-01-04 DIAGNOSIS — Z87448 Personal history of other diseases of urinary system: Secondary | ICD-10-CM | POA: Insufficient documentation

## 2014-01-04 DIAGNOSIS — Z79899 Other long term (current) drug therapy: Secondary | ICD-10-CM | POA: Insufficient documentation

## 2014-01-04 DIAGNOSIS — Z9483 Pancreas transplant status: Secondary | ICD-10-CM | POA: Insufficient documentation

## 2014-01-04 DIAGNOSIS — N949 Unspecified condition associated with female genital organs and menstrual cycle: Secondary | ICD-10-CM | POA: Insufficient documentation

## 2014-01-04 DIAGNOSIS — Z9119 Patient's noncompliance with other medical treatment and regimen: Secondary | ICD-10-CM | POA: Insufficient documentation

## 2014-01-04 DIAGNOSIS — F411 Generalized anxiety disorder: Secondary | ICD-10-CM | POA: Insufficient documentation

## 2014-01-04 DIAGNOSIS — Z7982 Long term (current) use of aspirin: Secondary | ICD-10-CM | POA: Insufficient documentation

## 2014-01-04 DIAGNOSIS — G8929 Other chronic pain: Secondary | ICD-10-CM

## 2014-01-04 DIAGNOSIS — Z91199 Patient's noncompliance with other medical treatment and regimen due to unspecified reason: Secondary | ICD-10-CM | POA: Insufficient documentation

## 2014-01-04 DIAGNOSIS — K3184 Gastroparesis: Secondary | ICD-10-CM | POA: Insufficient documentation

## 2014-01-04 DIAGNOSIS — Z8669 Personal history of other diseases of the nervous system and sense organs: Secondary | ICD-10-CM | POA: Insufficient documentation

## 2014-01-04 DIAGNOSIS — Z792 Long term (current) use of antibiotics: Secondary | ICD-10-CM | POA: Insufficient documentation

## 2014-01-04 DIAGNOSIS — R102 Pelvic and perineal pain: Secondary | ICD-10-CM

## 2014-01-04 DIAGNOSIS — Z87891 Personal history of nicotine dependence: Secondary | ICD-10-CM | POA: Insufficient documentation

## 2014-01-04 DIAGNOSIS — Z940 Kidney transplant status: Secondary | ICD-10-CM | POA: Insufficient documentation

## 2014-01-04 MED ORDER — ONDANSETRON 8 MG PO TBDP
8.0000 mg | ORAL_TABLET | Freq: Once | ORAL | Status: DC
  Filled 2014-01-04: qty 1

## 2014-01-04 MED ORDER — IBUPROFEN 800 MG PO TABS
800.0000 mg | ORAL_TABLET | Freq: Once | ORAL | Status: DC
  Filled 2014-01-04: qty 1

## 2014-01-04 NOTE — ED Provider Notes (Signed)
CSN: [id number]     Arrival date & time 01/04/14  0227 History   First MD Initiated Contact with Patient 01/04/14 [phone]     Chief Complaint  Patient presents with  . Abdominal Pain   HPI  History provided by the patient recent medical charts. Patient is a 33 year old female with history of kidney transplant, left ovarian cysts and chronic pelvic pain she presents with worsens left pelvic and ovarian cyst pain. Patient has had multiple frequent visits the emergency room for similar complaints. She states she has an appointment with OB/GYN this coming Friday. Yesterday she began having her menstrual cycle and states she has had worsened pain in her left lower pelvic area similar to her continued pains from an ovarian cyst she was diagnosed several weeks ago. Patient has been evaluated for this multiple times with CAT scans and ultrasounds. She states she was given prescriptions for Percocet at her last visit to [facility] emergency room. She was given 6 pills and states she has used all of these. She did try taking Tylenol without any improvement. Symptoms are associated with some nausea and occasional small amounts of vomiting. Denies any fever, chills or sweats. No vaginal bleeding or vaginal discharge. No urinary complaints.    Past Medical History  Diagnosis Date  . Vision loss   . Renal disorder   . Anxiety   . Gastroparesis   . Narcotic abuse   . Non compliance with medical treatment    Past Surgical History  Procedure Laterality Date  . Refractive surgery    . Eye surgery    . Av fistula placement  11/17/2011    Procedure: ARTERIOVENOUS (AV) FISTULA CREATION;  Surgeon: Rosetta Posner, MD;  Location: [place];  Service: Vascular;  Laterality: Right;  . Kidney transplant    . Pancrease transplant     Family History  Problem Relation Age of Onset  . Hypertension Father    History  Substance Use Topics  . Smoking status: Former Smoker -- 1 years    Types: Pipe  . Smokeless tobacco: Never  Used     Comment: Hooka  . Alcohol Use: No   OB History   Grav Para Term Preterm Abortions TAB SAB Ect Mult Living                 Review of Systems  Constitutional: Negative for fever, chills and diaphoresis.  All other systems reviewed and are negative.     Allergies  Fish-derived products and Shrimp  Home Medications   Prior to Admission medications   Medication Sig Start Date End Date Taking? Authorizing Provider  aspirin 81 MG tablet Take 81 mg by mouth daily.   Yes Historical Provider, MD  cephALEXin (KEFLEX) 500 MG capsule Take 500 mg by mouth 2 (two) times daily.   Yes Historical Provider, MD  cycloSPORINE (SANDIMMUNE) 100 MG capsule Take 200 mg by mouth 2 (two) times daily. For a 275 mg total dose twice daily.   Yes Historical Provider, MD  cycloSPORINE (SANDIMMUNE) 25 MG capsule Take 75 mg by mouth 2 (two) times daily. For a total dose of 275 mg twice daily   Yes Historical Provider, MD  metoCLOPramide (REGLAN) 10 MG tablet Take 10 mg by mouth 3 (three) times daily.   Yes Historical Provider, MD  mycophenolate (MYFORTIC) 180 MG EC tablet Take 540 mg by mouth 2 (two) times daily.   Yes Historical Provider, MD  oxyCODONE-acetaminophen (PERCOCET/ROXICET) 5-325 MG per tablet Take 1  tablet by mouth every 4 (four) hours as needed for severe pain. 12/21/13  Yes Varney Biles, MD  promethazine (PHENERGAN) 25 MG suppository Place 1 suppository (25 mg total) rectally every 6 (six) hours as needed for nausea. 12/21/13  Yes Varney Biles, MD  promethazine (PHENERGAN) 25 MG tablet Take 1 tablet (25 mg total) by mouth every 6 (six) hours as needed for nausea. 12/21/13  Yes Ankit Nanavati, MD   BP 138/86  Pulse 96  Temp(Src) 98.1 F (36.7 C) (Oral)  Resp 20  SpO2 100%  LMP 01/03/2014 Physical Exam  Nursing note and vitals reviewed. Constitutional: She is oriented to person, place, and time. She appears well-developed and well-nourished. No distress.  HENT:  Head: Normocephalic.   Cardiovascular: Normal rate and regular rhythm.   Pulmonary/Chest: Effort normal and breath sounds normal.  Abdominal: Soft. She exhibits no distension. There is tenderness in the left lower quadrant. There is no rebound, no guarding, no tenderness at McBurney's point and negative Murphy's sign.  Neurological: She is alert and oriented to person, place, and time.  Skin: Skin is warm and dry. No rash noted.  Psychiatric: She has a normal mood and affect. Her behavior is normal.    ED Course  Procedures   COORDINATION OF CARE:  Nursing notes reviewed. Vital signs reviewed. Initial pt interview and examination performed.   Filed Vitals:   01/04/14 0235 01/04/14 0519  BP: 138/86 146/87  Pulse: 96 91  Temp: 98.1 F (36.7 C) 98.6 F (37 C)  TempSrc: Oral Oral  Resp: 20 16  SpO2: 100% 99%    5:00 AM patient seen and evaluated. Patient appears well in no acute distress. She does not appear in any severe pain or discomfort. Patient talking on her cell phone went into the room. Pain is unchanged from her chronic pains. She has multiple prior visits to the emergency room for similar symptoms. Patient is requesting IV or IM Dilaudid specifically. She has unremarkable vital signs. No fever. Her examination is concerning with some left lower quadrant pains to palpation without peritoneal signs. At this time I do not suspect any emergent condition. With patient's multiple visits and specific requests for Dilaudid I have concern for possible drug seeking. I did discuss with patient the need to have a plan to treat her pains outpatient and offered to give her by mouth pain medicine. After this she refused any medicines or treatment and stated she would rather be discharged. She was encouraged to continue her planned followup with no usual and this Friday. Patient given strict return precautions and also advised that she may return at anytime she changes her mind.   MDM   Final diagnoses:  Chronic  pelvic pain in female      Martie Lee, [place] 01/04/14 [phone]

## 2014-01-04 NOTE — ED Provider Notes (Signed)
Medical screening examination/treatment/procedure(s) were performed by non-physician practitioner and as supervising physician I was immediately available for consultation/collaboration.    Teressa Lower, MD 01/04/14 [phone]

## 2014-01-04 NOTE — ED Notes (Signed)
Per EMS pt has been having lower abd pain for the past 2 weeks  Pt went to [facility] two weeks ago and was told she had an ovarian cyst and was given pain medication  Pt is out of her pain meds now

## 2014-01-04 NOTE — Discharge Instructions (Signed)
You were seen and evaluated for your continued chronic pelvic and ovarian cyst pains. You refused treatments offered to you in the emergency department. Please continue to followup with your primary care provider an OB/GYN specialist as planned this week. Return any time for changing or worsening symptoms.    Chronic Pain Chronic pain can be defined as pain that is off and on and lasts for 3 6 months or longer. Many things cause chronic pain, which can make it difficult to make a diagnosis. There are many treatment options available for chronic pain. However, finding a treatment that works well for you may require trying various approaches until the right one is found. Many people benefit from a combination of two or more types of treatment to control their pain. SYMPTOMS  Chronic pain can occur anywhere in the body and can range from mild to very severe. Some types of chronic pain include:  Headache.  Low back pain.  Cancer pain.  Arthritis pain.  Neurogenic pain. This is pain resulting from damage to nerves. People with chronic pain may also have other symptoms such as:  Depression.  Anger.  Insomnia.  Anxiety. DIAGNOSIS  Your health care provider will help diagnose your condition over time. In many cases, the initial focus will be on excluding possible conditions that could be causing the pain. Depending on your symptoms, your health care provider may order tests to diagnose your condition. Some of these tests may include:   Blood tests.   CT scan.   MRI.   X-rays.   Ultrasounds.   Nerve conduction studies.  You may need to see a specialist.  TREATMENT  Finding treatment that works well may take time. You may be referred to a pain specialist. He or she may prescribe medicine or therapies, such as:   Mindful meditation or yoga.  Shots (injections) of numbing or pain-relieving medicines into the spine or area of pain.  Local electrical  stimulation.  Acupuncture.   Massage therapy.   Aroma, color, light, or sound therapy.   Biofeedback.   Working with a physical therapist to keep from getting stiff.   Regular, gentle exercise.   Cognitive or behavioral therapy.   Group support.  Sometimes, surgery may be recommended.  HOME CARE INSTRUCTIONS   Take all medicines as directed by your health care provider.   Lessen stress in your life by relaxing and doing things such as listening to calming music.   Exercise or be active as directed by your health care provider.   Eat a healthy diet and include things such as vegetables, fruits, fish, and lean meats in your diet.   Keep all follow-up appointments with your health care provider.   Attend a support group with others suffering from chronic pain. SEEK MEDICAL CARE IF:   Your pain gets worse.   You develop a new pain that was not there before.   You cannot tolerate medicines given to you by your health care provider.   You have new symptoms since your last visit with your health care provider.  SEEK IMMEDIATE MEDICAL CARE IF:   You feel weak.   You have decreased sensation or numbness.   You lose control of bowel or bladder function.   Your pain suddenly gets much worse.   You develop shaking.  You develop chills.  You develop confusion.  You develop chest pain.  You develop shortness of breath.  MAKE SURE YOU:  Understand these instructions.  Will watch your  condition.  Will get help right away if you are not doing well or get worse. Document Released: 05/13/2002 Document Revised: 04/23/2013 Document Reviewed: 02/14/2013 [facility] Patient Information 2014 [employer].

## 2014-01-17 ENCOUNTER — Emergency Department (HOSPITAL_COMMUNITY)
Admission: EM | Admit: 2014-01-17 | Discharge: 2014-01-17 | Disposition: A | Discharge status: Home / Self Care | Payer: Medicare Other | Attending: Emergency Medicine | Admitting: Emergency Medicine

## 2014-01-17 ENCOUNTER — Encounter (HOSPITAL_COMMUNITY): Payer: Self-pay | Admitting: Emergency Medicine

## 2014-01-17 DIAGNOSIS — Z87891 Personal history of nicotine dependence: Secondary | ICD-10-CM | POA: Insufficient documentation

## 2014-01-17 DIAGNOSIS — R748 Abnormal levels of other serum enzymes: Secondary | ICD-10-CM | POA: Insufficient documentation

## 2014-01-17 DIAGNOSIS — Z9119 Patient's noncompliance with other medical treatment and regimen: Secondary | ICD-10-CM | POA: Insufficient documentation

## 2014-01-17 DIAGNOSIS — R112 Nausea with vomiting, unspecified: Secondary | ICD-10-CM | POA: Insufficient documentation

## 2014-01-17 DIAGNOSIS — R109 Unspecified abdominal pain: Secondary | ICD-10-CM

## 2014-01-17 DIAGNOSIS — Z87448 Personal history of other diseases of urinary system: Secondary | ICD-10-CM | POA: Insufficient documentation

## 2014-01-17 DIAGNOSIS — R7989 Other specified abnormal findings of blood chemistry: Secondary | ICD-10-CM

## 2014-01-17 DIAGNOSIS — Z7982 Long term (current) use of aspirin: Secondary | ICD-10-CM | POA: Insufficient documentation

## 2014-01-17 DIAGNOSIS — Z79899 Other long term (current) drug therapy: Secondary | ICD-10-CM | POA: Insufficient documentation

## 2014-01-17 DIAGNOSIS — Z9889 Other specified postprocedural states: Secondary | ICD-10-CM | POA: Insufficient documentation

## 2014-01-17 DIAGNOSIS — Z8719 Personal history of other diseases of the digestive system: Secondary | ICD-10-CM | POA: Insufficient documentation

## 2014-01-17 DIAGNOSIS — G8929 Other chronic pain: Secondary | ICD-10-CM | POA: Insufficient documentation

## 2014-01-17 DIAGNOSIS — Z91199 Patient's noncompliance with other medical treatment and regimen due to unspecified reason: Secondary | ICD-10-CM | POA: Insufficient documentation

## 2014-01-17 DIAGNOSIS — R1013 Epigastric pain: Secondary | ICD-10-CM | POA: Insufficient documentation

## 2014-01-17 DIAGNOSIS — Z8659 Personal history of other mental and behavioral disorders: Secondary | ICD-10-CM | POA: Insufficient documentation

## 2014-01-17 LAB — BASIC METABOLIC PANEL
BUN: 30 mg/dL — AB (ref 6–23)
CO2: 21 mEq/L (ref 19–32)
CREATININE: 1.18 mg/dL — AB (ref 0.50–1.10)
Calcium: 8.8 mg/dL (ref 8.4–10.5)
Chloride: 109 mEq/L (ref 96–112)
GFR calc Af Amer: 70 mL/min — ABNORMAL LOW (ref >90)
GFR calc non Af Amer: 60 mL/min — ABNORMAL LOW (ref >90)
GLUCOSE: 95 mg/dL (ref 70–99)
POTASSIUM: 4.3 meq/L (ref 3.7–5.3)
Sodium: 141 mEq/L (ref 137–147)

## 2014-01-17 LAB — COMPREHENSIVE METABOLIC PANEL
ALK PHOS: 151 U/L — AB (ref 39–117)
ALT: 17 U/L (ref 0–35)
AST: 17 U/L (ref 0–37)
Albumin: 3.9 g/dL (ref 3.5–5.2)
BUN: 31 mg/dL — ABNORMAL HIGH (ref 6–23)
CALCIUM: 10 mg/dL (ref 8.4–10.5)
CO2: 21 mEq/L (ref 19–32)
Chloride: 106 mEq/L (ref 96–112)
Creatinine, Ser: 1.27 mg/dL — ABNORMAL HIGH (ref 0.50–1.10)
GFR calc Af Amer: 64 mL/min — ABNORMAL LOW (ref >90)
GFR, EST NON AFRICAN AMERICAN: 55 mL/min — AB (ref >90)
GLUCOSE: 93 mg/dL (ref 70–99)
POTASSIUM: 4.5 meq/L (ref 3.7–5.3)
SODIUM: 142 meq/L (ref 137–147)
Total Bilirubin: 0.6 mg/dL (ref 0.3–1.2)
Total Protein: 8.1 g/dL (ref 6.0–8.3)

## 2014-01-17 LAB — CBC
HCT: 39.1 % (ref 36.0–46.0)
HEMOGLOBIN: 13.9 g/dL (ref 12.0–15.0)
MCH: 30.8 pg (ref 26.0–34.0)
MCHC: 35.5 g/dL (ref 30.0–36.0)
MCV: 86.7 fL (ref 78.0–100.0)
PLATELETS: 150 10*3/uL (ref 150–400)
RBC: 4.51 MIL/uL (ref 3.87–5.11)
RDW: 12 % (ref 11.5–15.5)
WBC: 4 10*3/uL (ref 4.0–10.5)

## 2014-01-17 LAB — LIPASE, BLOOD: Lipase: 16 U/L (ref 11–59)

## 2014-01-17 MED ORDER — OXYCODONE HCL 5 MG/5ML PO SOLN
5.0000 mg | Freq: Once | ORAL | Status: AC
  Administered 2014-01-17: 5 mg via ORAL
  Filled 2014-01-17: qty 5

## 2014-01-17 MED ORDER — ONDANSETRON HCL 4 MG/2ML IJ SOLN
4.0000 mg | Freq: Once | INTRAMUSCULAR | Status: AC
  Administered 2014-01-17: 4 mg via INTRAVENOUS
  Filled 2014-01-17: qty 2

## 2014-01-17 MED ORDER — SODIUM CHLORIDE 0.9 % IV BOLUS (SEPSIS)
1000.0000 mL | Freq: Once | INTRAVENOUS | Status: AC
  Administered 2014-01-17: 1000 mL via INTRAVENOUS

## 2014-01-17 MED ORDER — PROMETHAZINE HCL 25 MG RE SUPP: 25.0000 mg | Freq: Four times a day (QID) | RECTAL | Status: DC | PRN

## 2014-01-17 MED ORDER — ONDANSETRON HCL 4 MG/2ML IJ SOLN
4.0000 mg | Freq: Once | INTRAMUSCULAR | Status: AC
Start: 2014-01-17 — End: 2014-01-17
  Administered 2014-01-17: 4 mg via INTRAVENOUS
  Filled 2014-01-17: qty 2

## 2014-01-17 MED ORDER — OXYCODONE-ACETAMINOPHEN 5-325 MG PO TABS
2.0000 | ORAL_TABLET | Freq: Once | ORAL | Status: DC
  Filled 2014-01-17: qty 2

## 2014-01-17 NOTE — ED Provider Notes (Signed)
CSN: [id number]     Arrival date & time 01/17/14  1448 History   First MD Initiated Contact with Patient 01/17/14 1504     Chief Complaint  Patient presents with  . Abdominal Pain     (Consider location/radiation/quality/duration/timing/severity/associated sxs/prior Treatment) The history is provided by the patient and medical records. No language interpreter was used.    [name] [name] is a 33 y.o. female  with a hx of ofkidney and pancrease transplant followed by [facility] with subsequent chronic abd pain presents to the Emergency Department complaining of gradual, persistent, progressively worsening epigastric abdominal pain at the site of her incision onset this morning.  Patient endorses several episodes of nausea and vomiting. Patient reports that her usual abdominal pain is in the left lower quadrant, but today her pain is different. Pt reports that it woke her from sleep this morning.  No aggravating or alleviating factors.  Pt denies fever, chills, headache, neck pain, chest pain, shortness of breath, diarrhea, weakness, dizziness, syncope, dysuria, hematuria.      Past Medical History  Diagnosis Date  . Vision loss   . Renal disorder   . Anxiety   . Gastroparesis   . Narcotic abuse   . Non compliance with medical treatment    Past Surgical History  Procedure Laterality Date  . Refractive surgery    . Eye surgery    . Av fistula placement  11/17/2011    Procedure: ARTERIOVENOUS (AV) FISTULA CREATION;  Surgeon: Rosetta Posner, MD;  Location: [place];  Service: Vascular;  Laterality: Right;  . Kidney transplant    . Pancrease transplant     Family History  Problem Relation Age of Onset  . Hypertension Father    History  Substance Use Topics  . Smoking status: Former Smoker -- 1 years    Types: Pipe  . Smokeless tobacco: Never Used     Comment: Hooka  . Alcohol Use: No   OB History   Grav Para Term Preterm Abortions TAB SAB Ect Mult Living                  Review of Systems  Constitutional: Negative for fever, diaphoresis, appetite change, fatigue and unexpected weight change.  HENT: Negative for mouth sores and trouble swallowing.   Respiratory: Negative for cough, chest tightness, shortness of breath, wheezing and stridor.   Cardiovascular: Negative for chest pain and palpitations.  Gastrointestinal: Positive for nausea, vomiting and abdominal pain. Negative for diarrhea, constipation, blood in stool, abdominal distention and rectal pain.  Endocrine: Negative for polydipsia, polyphagia and polyuria.  Genitourinary: Negative for dysuria, urgency, frequency, hematuria, flank pain and difficulty urinating.  Musculoskeletal: Negative for back pain, neck pain and neck stiffness.  Skin: Negative for rash.  Neurological: Negative for weakness.  Hematological: Negative for adenopathy.  Psychiatric/Behavioral: Negative for confusion.  All other systems reviewed and are negative.     Allergies  Fish-derived products and Shrimp  Home Medications   Prior to Admission medications   Medication Sig Start Date End Date Taking? Authorizing Provider  aspirin 81 MG tablet Take 81 mg by mouth daily.   Yes Historical Provider, MD  cycloSPORINE (SANDIMMUNE) 100 MG capsule Take 200 mg by mouth 2 (two) times daily. For a 275 mg total dose twice daily.   Yes Historical Provider, MD  cycloSPORINE (SANDIMMUNE) 25 MG capsule Take 75 mg by mouth 2 (two) times daily. For a total dose of 275 mg twice daily   Yes Historical  Provider, MD  metoCLOPramide (REGLAN) 10 MG tablet Take 10 mg by mouth 3 (three) times daily.   Yes Historical Provider, MD  mycophenolate (MYFORTIC) 180 MG EC tablet Take 540 mg by mouth 2 (two) times daily.   Yes Historical Provider, MD   BP 156/76  Pulse 88  Temp(Src) 98.2 F (36.8 C) (Oral)  Resp 16  SpO2 100%  LMP 01/03/2014 Physical Exam  Nursing note and vitals reviewed. Constitutional: She is oriented to person, place, and  time. She appears well-developed and well-nourished. No distress.  Awake, alert, nontoxic appearance Patient crying in the room  HENT:  Head: Normocephalic and atraumatic.  Mouth/Throat: Oropharynx is clear and moist. No oropharyngeal exudate.  Eyes: Conjunctivae are normal. No scleral icterus.  Neck: Normal range of motion. Neck supple.  Cardiovascular: Normal rate, regular rhythm, normal heart sounds and intact distal pulses.   No murmur heard. Pulmonary/Chest: Effort normal and breath sounds normal. No respiratory distress. She has no wheezes.  Abdominal: Soft. Bowel sounds are normal. She exhibits no distension and no mass. There is tenderness. There is guarding. There is no rebound.  Well healed midline surgical incision with pain to palpation of the proximal portion. Incision without erythema, induration, dehiscence. No palpable hernias in the incision  Musculoskeletal: Normal range of motion. She exhibits no edema.  Lymphadenopathy:    She has no cervical adenopathy.  Neurological: She is alert and oriented to person, place, and time. She exhibits normal muscle tone. Coordination normal.  Speech is clear and goal oriented Moves extremities without ataxia  Skin: Skin is warm and dry. She is not diaphoretic. No erythema.  Psychiatric: She has a normal mood and affect. Her behavior is normal.    ED Course  Procedures (including critical care time) Labs Review Labs Reviewed  COMPREHENSIVE METABOLIC PANEL - Abnormal; Notable for the following:    BUN 31 (*)    Creatinine, Ser 1.27 (*)    Alkaline Phosphatase 151 (*)    GFR calc non Af Amer 55 (*)    GFR calc Af Amer 64 (*)    All other components within normal limits  BASIC METABOLIC PANEL - Abnormal; Notable for the following:    BUN 30 (*)    Creatinine, Ser 1.18 (*)    GFR calc non Af Amer 60 (*)    GFR calc Af Amer 70 (*)    All other components within normal limits  CBC  LIPASE, BLOOD    Imaging Review No results  found.   EKG Interpretation None      MDM   Final diagnoses:  Chronic abdominal pain  Elevated serum creatinine   [name] [name] presents with chronic abdominal pain. Patient has been seen 17 times in the last month for the same pain. She has also had multiple visits to [facility] for similar complaints. Concern for abarent opioid use behavior.  She specifically requests IV Dilaudid several times.  Her vital signs are unremarkable. She is afebrile and not tachycardic.  Her abdomen is soft with voluntary guarding.    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  She's had multiple prescriptions of Percocet and by mouth Dilaudid at home.  She reports emesis at home heart is no evidence of emesis here in the emergency department.  Review shows the patient was seen in [facility] on 12/27/2013 and again on 01/04/2014 for the same pain.  Review shows the patient was seen in the emergency Department [facility] on 4/28, 5/12 and again just yesterday (01/16/14)for the same pain. Patient had a repeat CT scan on 01/13/2014 about issuing questionable gastroparesis but no acute findings in her pregnancy test yesterday was negative. All her lab work yesterday was within normal limits and she apparently eloped from the emergency department when she was not given IV pain medication.  5:10PM Patient with elevated BUN and creatinine today. Creatinine 1.27, above baseline which runs approximately 1.06.  Will give second liter of fluid and repeat lab work.  She continues to demand IV Dilaudid and Phenergan. I have continued to offer by mouth pain control and Zofran and she has declined.  7:05 PM Pt BMP with improved kidney function.  She is currently tolerating PO pain medication and PO fluids. Patient's elevated creatinine likely secondary to dehydration.  She reported nausea and vomiting at home however she's had no episodes of emesis here in the department.  Creatinine is improved a repeat bloodwork to 1.18.  I believe that she is safe for discharge and have instructed her to orally hydrate well at home.  Patient reports she has pain medication and nausea medication at home but it "does not work."  Patient has been told to return to the emergency department for high fevers, decreased urine or other concerning symptoms.  It has been determined that no acute conditions requiring further emergency intervention are present at this time. The patient/guardian have been advised of the diagnosis and plan. We have discussed signs and symptoms that warrant return to the ED, such as changes or worsening in symptoms.   Vital signs are stable at discharge.   BP 156/76  Pulse 88  Temp(Src) 98.2 F (36.8 C) (Oral)  Resp 16  SpO2 100%  LMP 01/03/2014  Patient/guardian has voiced understanding and agreed to follow-up with the PCP or specialist.       Abigail Butts, PA-C 01/17/14 1912

## 2014-01-17 NOTE — ED Notes (Addendum)
Per EMS: Pt had a kidney and pancreatic transplant at [facility] in November. Pt had a post-op appointment two weeks ago and no complications were found. Pt states that she woke up today with pain at the top of the incision that has persisted since that time. Pt states the area is tender to touch. Pt reports vomiting three times today and was given 4 mg of Zofran in route via EMS. Pt has fistula in right arm due to history of dialysis, however is not currently receiving dialysis treatment. Pt is A/O x4.

## 2014-01-17 NOTE — ED Provider Notes (Signed)
Medical screening examination/treatment/procedure(s) were performed by non-physician practitioner and as supervising physician I was immediately available for consultation/collaboration.  Richarda Blade, MD 01/17/14 [phone]

## 2014-01-17 NOTE — Discharge Instructions (Signed)
1. Medications: usual home medications including home nausea and pain control 2. Treatment: rest, drink plenty of fluids,  3. Follow Up: Please followup with your primary doctor in 3 days for discussion of your diagnoses and further evaluation after today's visit;

## 2014-01-28 ENCOUNTER — Encounter (HOSPITAL_COMMUNITY): Payer: Self-pay | Admitting: Emergency Medicine

## 2014-01-28 ENCOUNTER — Emergency Department (HOSPITAL_COMMUNITY)
Admission: EM | Admit: 2014-01-28 | Discharge: 2014-01-29 | Disposition: A | Discharge status: Home / Self Care | Payer: Medicare Other | Attending: Emergency Medicine | Admitting: Emergency Medicine

## 2014-01-28 DIAGNOSIS — Z862 Personal history of diseases of the blood and blood-forming organs and certain disorders involving the immune mechanism: Secondary | ICD-10-CM | POA: Insufficient documentation

## 2014-01-28 DIAGNOSIS — Z79899 Other long term (current) drug therapy: Secondary | ICD-10-CM | POA: Insufficient documentation

## 2014-01-28 DIAGNOSIS — Z8719 Personal history of other diseases of the digestive system: Secondary | ICD-10-CM | POA: Insufficient documentation

## 2014-01-28 DIAGNOSIS — Z8659 Personal history of other mental and behavioral disorders: Secondary | ICD-10-CM | POA: Insufficient documentation

## 2014-01-28 DIAGNOSIS — R112 Nausea with vomiting, unspecified: Secondary | ICD-10-CM | POA: Insufficient documentation

## 2014-01-28 DIAGNOSIS — Z87448 Personal history of other diseases of urinary system: Secondary | ICD-10-CM | POA: Insufficient documentation

## 2014-01-28 DIAGNOSIS — Z3202 Encounter for pregnancy test, result negative: Secondary | ICD-10-CM | POA: Insufficient documentation

## 2014-01-28 DIAGNOSIS — Z91199 Patient's noncompliance with other medical treatment and regimen due to unspecified reason: Secondary | ICD-10-CM | POA: Insufficient documentation

## 2014-01-28 DIAGNOSIS — Z87891 Personal history of nicotine dependence: Secondary | ICD-10-CM | POA: Insufficient documentation

## 2014-01-28 DIAGNOSIS — Z7982 Long term (current) use of aspirin: Secondary | ICD-10-CM | POA: Insufficient documentation

## 2014-01-28 DIAGNOSIS — Z9119 Patient's noncompliance with other medical treatment and regimen: Secondary | ICD-10-CM | POA: Insufficient documentation

## 2014-01-28 DIAGNOSIS — R1084 Generalized abdominal pain: Secondary | ICD-10-CM | POA: Insufficient documentation

## 2014-01-28 DIAGNOSIS — Z8639 Personal history of other endocrine, nutritional and metabolic disease: Secondary | ICD-10-CM | POA: Insufficient documentation

## 2014-01-28 DIAGNOSIS — G8929 Other chronic pain: Secondary | ICD-10-CM | POA: Insufficient documentation

## 2014-01-28 DIAGNOSIS — R109 Unspecified abdominal pain: Secondary | ICD-10-CM

## 2014-01-28 LAB — COMPREHENSIVE METABOLIC PANEL
ALT: 24 U/L (ref 0–35)
AST: 17 U/L (ref 0–37)
Albumin: 3.7 g/dL (ref 3.5–5.2)
Alkaline Phosphatase: 155 U/L — ABNORMAL HIGH (ref 39–117)
BILIRUBIN TOTAL: 0.4 mg/dL (ref 0.3–1.2)
BUN: 23 mg/dL (ref 6–23)
CALCIUM: 9.6 mg/dL (ref 8.4–10.5)
CHLORIDE: 105 meq/L (ref 96–112)
CO2: 22 meq/L (ref 19–32)
Creatinine, Ser: 1.67 mg/dL — ABNORMAL HIGH (ref 0.50–1.10)
GFR calc Af Amer: 46 mL/min — ABNORMAL LOW (ref >90)
GFR calc non Af Amer: 40 mL/min — ABNORMAL LOW (ref >90)
Glucose, Bld: 93 mg/dL (ref 70–99)
POTASSIUM: 5 meq/L (ref 3.7–5.3)
Sodium: 140 mEq/L (ref 137–147)
Total Protein: 7.9 g/dL (ref 6.0–8.3)

## 2014-01-28 LAB — CBC WITH DIFFERENTIAL/PLATELET
BASOS ABS: 0 10*3/uL (ref 0.0–0.1)
Basophils Relative: 0 % (ref 0–1)
Eosinophils Absolute: 0.1 10*3/uL (ref 0.0–0.7)
Eosinophils Relative: 4 % (ref 0–5)
HCT: 36 % (ref 36.0–46.0)
Hemoglobin: 12.9 g/dL (ref 12.0–15.0)
LYMPHS PCT: 13 % (ref 12–46)
Lymphs Abs: 0.5 10*3/uL — ABNORMAL LOW (ref 0.7–4.0)
MCH: 30.9 pg (ref 26.0–34.0)
MCHC: 35.8 g/dL (ref 30.0–36.0)
MCV: 86.3 fL (ref 78.0–100.0)
Monocytes Absolute: 0.4 10*3/uL (ref 0.1–1.0)
Monocytes Relative: 11 % (ref 3–12)
NEUTROS ABS: 2.8 10*3/uL (ref 1.7–7.7)
NEUTROS PCT: 72 % (ref 43–77)
PLATELETS: 149 10*3/uL — AB (ref 150–400)
RBC: 4.17 MIL/uL (ref 3.87–5.11)
RDW: 12 % (ref 11.5–15.5)
WBC: 3.8 10*3/uL — AB (ref 4.0–10.5)

## 2014-01-28 LAB — URINALYSIS, ROUTINE W REFLEX MICROSCOPIC
Bilirubin Urine: NEGATIVE
Glucose, UA: NEGATIVE mg/dL
Hgb urine dipstick: NEGATIVE
Ketones, ur: NEGATIVE mg/dL
Leukocytes, UA: NEGATIVE
NITRITE: NEGATIVE
PH: 6 (ref 5.0–8.0)
Protein, ur: NEGATIVE mg/dL
SPECIFIC GRAVITY, URINE: 1.019 (ref 1.005–1.030)
UROBILINOGEN UA: 0.2 mg/dL (ref 0.0–1.0)

## 2014-01-28 LAB — LIPASE, BLOOD: Lipase: 18 U/L (ref 11–59)

## 2014-01-28 LAB — POC URINE PREG, ED: PREG TEST UR: NEGATIVE

## 2014-01-28 MED ORDER — SODIUM CHLORIDE 0.9 % IV SOLN: 1000.0000 mL | Freq: Once | INTRAVENOUS | Status: DC

## 2014-01-28 MED ORDER — DICYCLOMINE HCL 10 MG/ML IM SOLN
20.0000 mg | Freq: Once | INTRAMUSCULAR | Status: AC
  Administered 2014-01-29: 20 mg via INTRAMUSCULAR
  Filled 2014-01-28: qty 2

## 2014-01-28 MED ORDER — SODIUM CHLORIDE 0.9 % IV SOLN: 1000.0000 mL | INTRAVENOUS | Status: DC

## 2014-01-28 MED ORDER — PROMETHAZINE HCL 25 MG/ML IJ SOLN
25.0000 mg | Freq: Once | INTRAMUSCULAR | Status: AC
  Administered 2014-01-29: 25 mg via INTRAVENOUS
  Filled 2014-01-28: qty 1

## 2014-01-28 NOTE — ED Notes (Addendum)
Pt reports 10/10 generalized abdominal pain that began last night. Pt reports nausea, emesis, diarrhea, and back pain. Pt seen for the same earlier this month. Pt is A/O x4, in NAD, and vitals are WDL.

## 2014-01-28 NOTE — ED Notes (Signed)
Patient with c/o generalized abdominal pain Patient with hx of chronic abdominal pain, narcotic abuse and recent transplant sx in November Patient with 10 recent prior ED visits for the same Patient in NAD upon assessment

## 2014-01-29 ENCOUNTER — Emergency Department (HOSPITAL_COMMUNITY): Payer: Medicare Other

## 2014-01-29 ENCOUNTER — Telehealth (HOSPITAL_COMMUNITY): Payer: Self-pay

## 2014-01-29 LAB — CYCLOSPORINE LEVEL: Cyclosporine: 686 ng/mL (ref 140–330)

## 2014-01-29 LAB — I-STAT CG4 LACTIC ACID, ED: Lactic Acid, Venous: 1.21 mmol/L (ref 0.5–2.2)

## 2014-01-29 IMAGING — CR DG CHEST 2V
2 series · 2 of 2 positions shown · non-contrast
Comparison: [DATE]

CLINICAL DATA: Fever, chest pain, post kidney transplant [DATE]

EXAM:
CHEST  2 VIEW

[w chest pa]
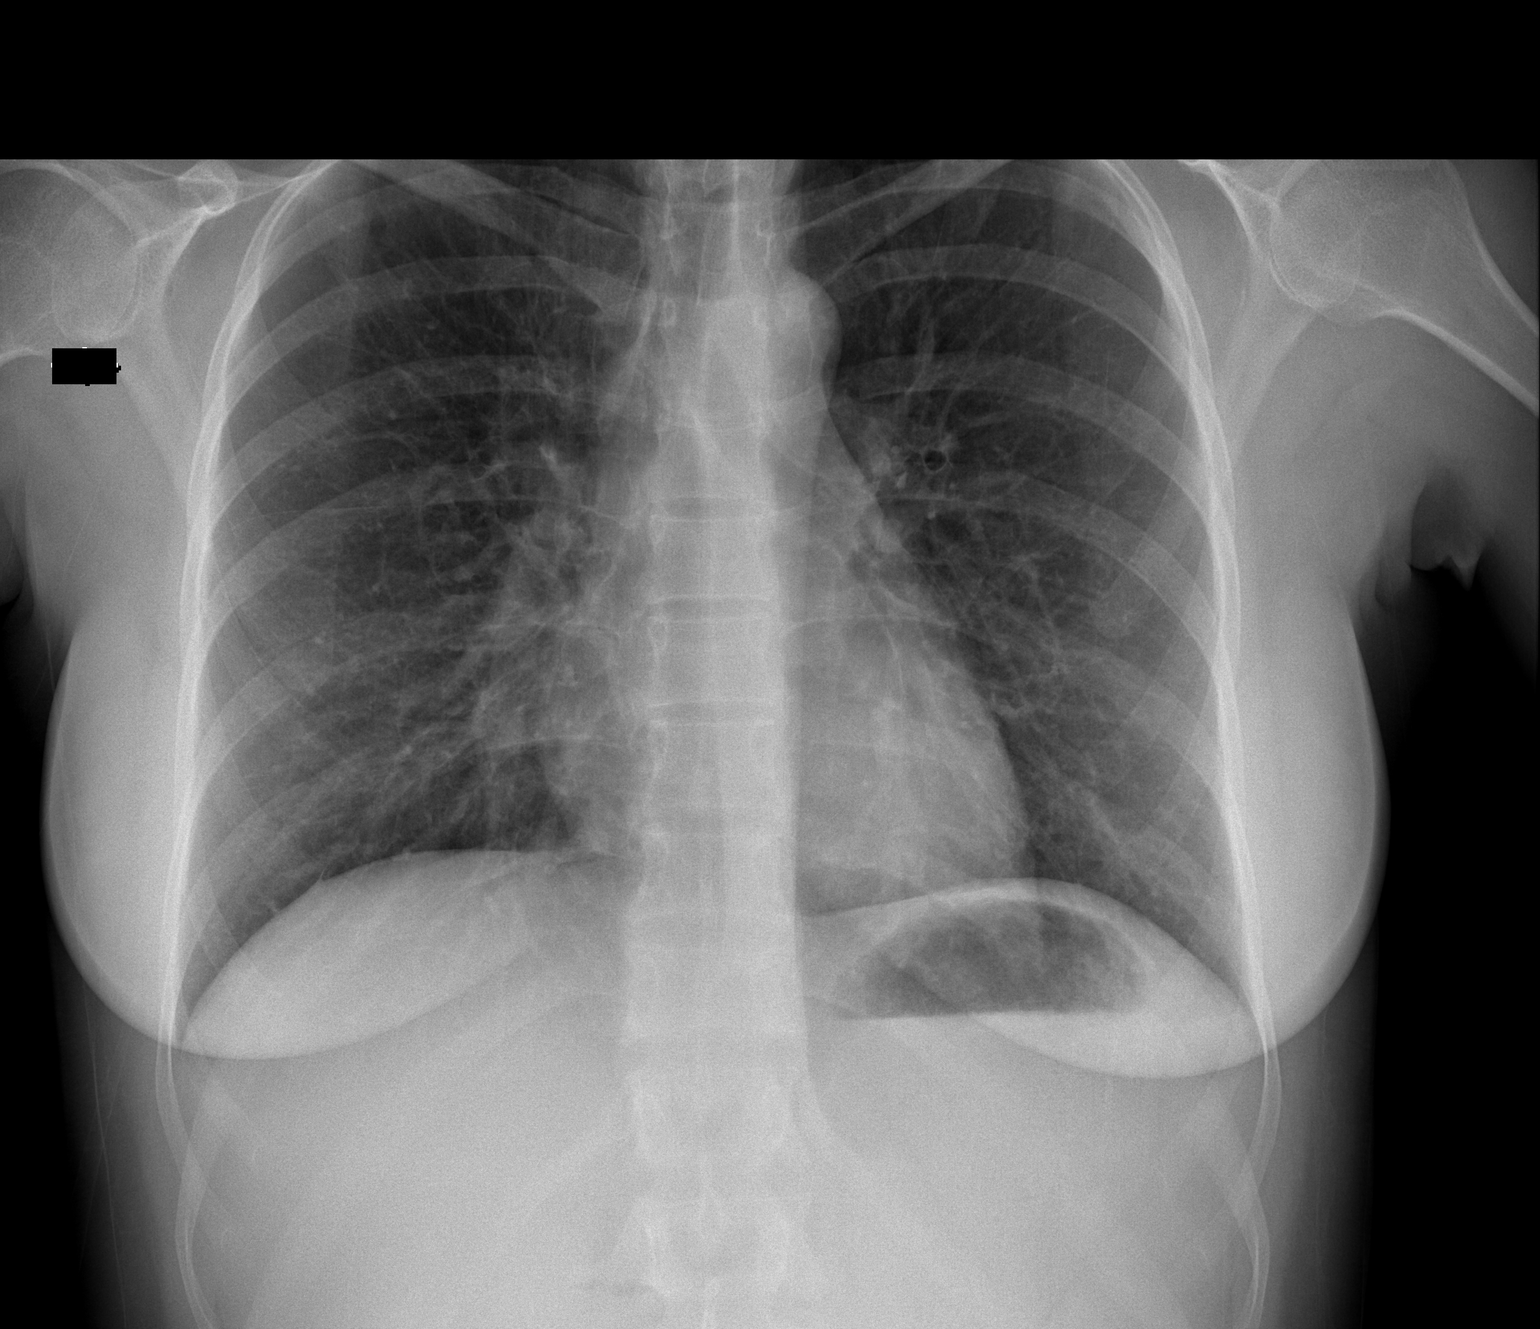

[w chest lat]
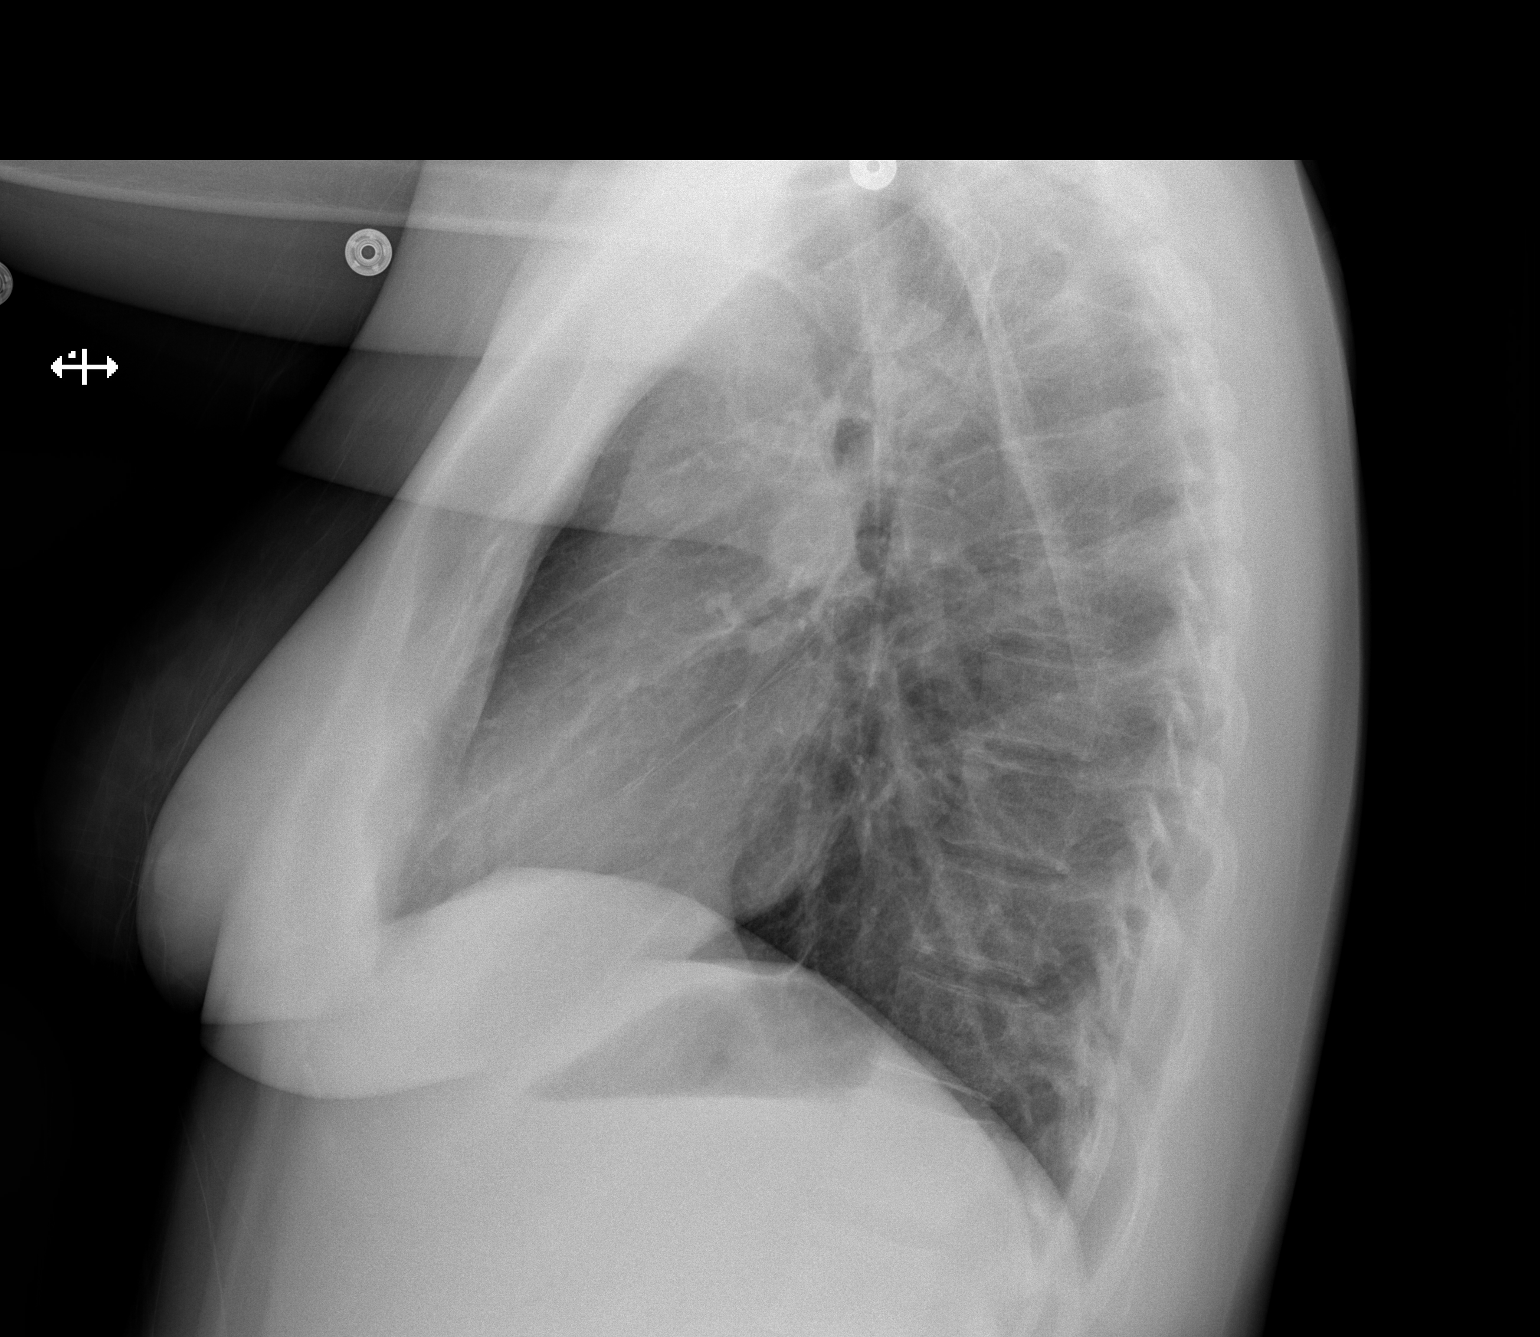

[2 of 2 positions shown; findings below may reference images not displayed]

FINDINGS: Normal heart size, mediastinal contours, and pulmonary vascularity.

Chronic peribronchial thickening.

No acute infiltrate, pleural effusion or pneumothorax.

Bones unremarkable.
IMPRESSION: Mild chronic bronchitic changes without acute infiltrate.

## 2014-01-29 MED ORDER — DICYCLOMINE HCL 20 MG PO TABS: 20.0000 mg | ORAL_TABLET | Freq: Four times a day (QID) | ORAL | Status: DC | PRN

## 2014-01-29 MED ORDER — PROMETHAZINE HCL 25 MG RE SUPP: 25.0000 mg | Freq: Four times a day (QID) | RECTAL | Status: DC | PRN

## 2014-01-29 MED ORDER — SODIUM CHLORIDE 0.9 % IV BOLUS (SEPSIS)
1000.0000 mL | Freq: Once | INTRAVENOUS | Status: AC
  Administered 2014-01-29: 1000 mL via INTRAVENOUS

## 2014-01-29 NOTE — Discharge Instructions (Signed)
Please return to the ER for persistent fevers, rashes, cough, urinary problems or other new concerns.  Follow up at [facility] as scheduled.  Take medications as prescribed.   Nausea and Vomiting Nausea means you feel sick to your stomach. Throwing up (vomiting) is a reflex where stomach contents come out of your mouth. HOME CARE   Take medicine as told by your doctor.  Do not force yourself to eat. However, you do need to drink fluids.  If you feel like eating, eat a normal diet as told by your doctor.  Eat rice, wheat, potatoes, bread, lean meats, yogurt, fruits, and vegetables.  Avoid high-fat foods.  Drink enough fluids to keep your pee (urine) clear or pale yellow.  Ask your doctor how to replace body fluid losses (rehydrate). Signs of body fluid loss (dehydration) include:  Feeling very thirsty.  Dry lips and mouth.  Feeling dizzy.  Dark pee.  Peeing less than normal.  Feeling confused.  Fast breathing or heart rate. GET HELP RIGHT AWAY IF:   You have blood in your throw up.  You have black or bloody poop (stool).  You have a bad headache or stiff neck.  You feel confused.  You have bad belly (abdominal) pain.  You have chest pain or trouble breathing.  You do not pee at least once every 8 hours.  You have cold, clammy skin.  You keep throwing up after 24 to 48 hours.  You have a fever. MAKE SURE YOU:   Understand these instructions.  Will watch your condition.  Will get help right away if you are not doing well or get worse. Document Released: 02/07/2008 Document Revised: 11/13/2011 Document Reviewed: 01/20/2011 [facility] Patient Information 2014 [place], [place].  Chronic Pain Chronic pain can be defined as pain that is off and on and lasts for 3 6 months or longer. Many things cause chronic pain, which can make it difficult to make a diagnosis. There are many treatment options available for chronic pain. However, finding a treatment that works  well for you may require trying various approaches until the right one is found. Many people benefit from a combination of two or more types of treatment to control their pain. SYMPTOMS  Chronic pain can occur anywhere in the body and can range from mild to very severe. Some types of chronic pain include:  Headache.  Low back pain.  Cancer pain.  Arthritis pain.  Neurogenic pain. This is pain resulting from damage to nerves. People with chronic pain may also have other symptoms such as:  Depression.  Anger.  Insomnia.  Anxiety. DIAGNOSIS  Your health care provider will help diagnose your condition over time. In many cases, the initial focus will be on excluding possible conditions that could be causing the pain. Depending on your symptoms, your health care provider may order tests to diagnose your condition. Some of these tests may include:   Blood tests.   CT scan.   MRI.   X-rays.   Ultrasounds.   Nerve conduction studies.  You may need to see a specialist.  TREATMENT  Finding treatment that works well may take time. You may be referred to a pain specialist. He or she may prescribe medicine or therapies, such as:   Mindful meditation or yoga.  Shots (injections) of numbing or pain-relieving medicines into the spine or area of pain.  Local electrical stimulation.  Acupuncture.   Massage therapy.   Aroma, color, light, or sound therapy.   Biofeedback.  Working with a physical therapist to keep from getting stiff.   Regular, gentle exercise.   Cognitive or behavioral therapy.   Group support.  Sometimes, surgery may be recommended.  HOME CARE INSTRUCTIONS   Take all medicines as directed by your health care provider.   Lessen stress in your life by relaxing and doing things such as listening to calming music.   Exercise or be active as directed by your health care provider.   Eat a healthy diet and include things such as  vegetables, fruits, fish, and lean meats in your diet.   Keep all follow-up appointments with your health care provider.   Attend a support group with others suffering from chronic pain. SEEK MEDICAL CARE IF:   Your pain gets worse.   You develop a new pain that was not there before.   You cannot tolerate medicines given to you by your health care provider.   You have new symptoms since your last visit with your health care provider.  SEEK IMMEDIATE MEDICAL CARE IF:   You feel weak.   You have decreased sensation or numbness.   You lose control of bowel or bladder function.   Your pain suddenly gets much worse.   You develop shaking.  You develop chills.  You develop confusion.  You develop chest pain.  You develop shortness of breath.  MAKE SURE YOU:  Understand these instructions.  Will watch your condition.  Will get help right away if you are not doing well or get worse. Document Released: 05/13/2002 Document Revised: 04/23/2013 Document Reviewed: 02/14/2013 [facility] Patient Information 2014 [place].

## 2014-01-29 NOTE — ED Notes (Signed)
Per Dr. Sharol Given, Phenergan to be given IM

## 2014-01-29 NOTE — ED Notes (Signed)
Chart left with March Rummage PA to review high cyclosporin level. Advised to call flow manager office when ready. She wanted to review with MD before giving orders.

## 2014-01-29 NOTE — ED Notes (Signed)
IV team RN at bedside--only able to obtain 24 gauge in left hand and obtain 2 ml of blood Unable to obtain BC x 2 Will not be able to administer IV Phenergan due to size and placement of PIV site Will make Dr. Sharol Given aware

## 2014-01-29 NOTE — ED Provider Notes (Addendum)
CSN: [id number]     Arrival date & time 01/28/14  2143 History   First MD Initiated Contact with Patient 01/28/14 2305     Chief Complaint  Patient presents with  . Abdominal Pain     (Consider location/radiation/quality/duration/timing/severity/associated sxs/prior Treatment) HPI 33 year old female presents to emergency department from home with complaint of diffuse abdominal pain.  Patient reports nausea vomiting diarrhea back pain.  She reports fever tonight to 103.  She did not take any medicine for fever.  Patient is afebrile here.  She denies any cough, no dysuria.  Patient has complicated past medical history of pancreas and renal transplant done at [facility] in November, and has had problem with chronic abdominal pain and drug-seeking behavior since that time.  She has been seen at [facility] and our ER multiple times.  Patient reports pain is diffuse at this time.  She is having chills.  She feels achy all over.  She has followup scheduled on Friday with her transplant team.  She reports that her home medications have not been working.  She is requesting IV the law that and Phenergan to help with her current symptoms as she feels it may work better than the oral medications. Past Medical History  Diagnosis Date  . Vision loss   . Renal disorder   . Anxiety   . Gastroparesis   . Narcotic abuse   . Non compliance with medical treatment    Past Surgical History  Procedure Laterality Date  . Refractive surgery    . Eye surgery    . Av fistula placement  11/17/2011    Procedure: ARTERIOVENOUS (AV) FISTULA CREATION;  Surgeon: Rosetta Posner, MD;  Location: [place];  Service: Vascular;  Laterality: Right;  . Kidney transplant    . Pancrease transplant     Family History  Problem Relation Age of Onset  . Hypertension Father    History  Substance Use Topics  . Smoking status: Former Smoker -- 1 years    Types: Pipe  . Smokeless tobacco: Never Used     Comment: Hooka  . Alcohol Use:  No   OB History   Grav Para Term Preterm Abortions TAB SAB Ect Mult Living                 Review of Systems   See History of Present Illness; otherwise all other systems are reviewed and negative  Allergies  Fish-derived products and Shrimp  Home Medications   Prior to Admission medications   Medication Sig Start Date End Date Taking? Authorizing Provider  aspirin 81 MG tablet Take 81 mg by mouth daily.   Yes Historical Provider, MD  cycloSPORINE (SANDIMMUNE) 100 MG capsule Take 200 mg by mouth 2 (two) times daily. For a 275 mg total dose twice daily.   Yes Historical Provider, MD  cycloSPORINE (SANDIMMUNE) 25 MG capsule Take 75 mg by mouth 2 (two) times daily. For a total dose of 275 mg twice daily   Yes Historical Provider, MD  metoCLOPramide (REGLAN) 10 MG tablet Take 10 mg by mouth 3 (three) times daily.   Yes Historical Provider, MD  mycophenolate (MYFORTIC) 180 MG EC tablet Take 540 mg by mouth 2 (two) times daily.   Yes Historical Provider, MD  promethazine (PHENERGAN) 25 MG suppository Place 1 suppository (25 mg total) rectally every 6 (six) hours as needed for nausea or vomiting. 01/17/14  Yes Hannah Muthersbaugh, PA-C   BP 143/85  Pulse 92  Temp(Src) 98.1  F (36.7 C) (Oral)  Resp 18  Ht 5\' 2"  (1.575 m)  Wt 120 lb (54.432 kg)  BMI 21.94 kg/m2  SpO2 100%  LMP 01/03/2014 Physical Exam  Nursing note and vitals reviewed. Constitutional: She is oriented to person, place, and time. She appears well-developed and well-nourished.  Patient is nontoxic-appearing, moves about easily on the bed.  HENT:  Head: Normocephalic and atraumatic.  Nose: Nose normal.  Mouth/Throat: Oropharynx is clear and moist.  Eyes: Conjunctivae and EOM are normal. Pupils are equal, round, and reactive to light.  Neck: Normal range of motion. Neck supple. No JVD present. No tracheal deviation present. No thyromegaly present.  Cardiovascular: Normal rate, regular rhythm, normal heart sounds and  intact distal pulses.  Exam reveals no gallop and no friction rub.   No murmur heard. Pulmonary/Chest: Effort normal and breath sounds normal. No stridor. No respiratory distress. She has no wheezes. She has no rales. She exhibits no tenderness.  Abdominal: Soft. Bowel sounds are normal. She exhibits no distension and no mass. There is tenderness (patient reports diffuse tenderness to all palpation, but when distracted does not have reproducible pain.). There is no rebound and no guarding.  Musculoskeletal: Normal range of motion. She exhibits no edema and no tenderness.  Lymphadenopathy:    She has no cervical adenopathy.  Neurological: She is alert and oriented to person, place, and time. She has normal reflexes. No cranial nerve deficit. She exhibits normal muscle tone. Coordination normal.  Skin: Skin is warm and dry. No rash noted. No erythema. No pallor.  Psychiatric: She has a normal mood and affect. Her behavior is normal. Judgment and thought content normal.    ED Course  Procedures (including critical care time) Labs Review Labs Reviewed  CBC WITH DIFFERENTIAL - Abnormal; Notable for the following:    WBC 3.8 (*)    Platelets 149 (*)    Lymphs Abs 0.5 (*)    All other components within normal limits  COMPREHENSIVE METABOLIC PANEL - Abnormal; Notable for the following:    Creatinine, Ser 1.67 (*)    Alkaline Phosphatase 155 (*)    GFR calc non Af Amer 40 (*)    GFR calc Af Amer 46 (*)    All other components within normal limits  URINE CULTURE  CULTURE, BLOOD (ROUTINE X 2)  CULTURE, BLOOD (ROUTINE X 2)  URINALYSIS, ROUTINE W REFLEX MICROSCOPIC  LIPASE, BLOOD  CYCLOSPORINE LEVEL  POC URINE PREG, ED  I-STAT CG4 LACTIC ACID, ED    Imaging Review Dg Chest 2 View  01/29/2014   CLINICAL DATA:  Fever, chest pain, post kidney transplant November 2014  EXAM: CHEST  2 VIEW  COMPARISON:  12/20/2013  FINDINGS: Normal heart size, mediastinal contours, and pulmonary vascularity.   Chronic peribronchial thickening.  No acute infiltrate, pleural effusion or pneumothorax.  Bones unremarkable.  IMPRESSION: Mild chronic bronchitic changes without acute infiltrate.   Electronically Signed   By: Lavonia Dana M.D.   On: 01/29/2014 01:14     EKG Interpretation None      MDM   Final diagnoses:  Chronic abdominal pain  Nausea and vomiting  Personal history of immunocompromised state   33 year old female with chronic abdominal pain.  At this time, however she is reported fever.  This is concerning in light of her recent transplant.  Patient is vague about taking the cyclosporine, however.  I discussed the case with the on-call nephrologist at [facility].  He recommends doing a infectious workup with blood  and urine cultures and chest x-ray.  Patient does not have any specific findings on exam to indicate an infectious source.  Her creatinine has increased from her baseline to 1.67.  She will receive IV fluids.  She does have followup scheduled on Friday.  At this time I feel that she is safe for discharge home as the rest of her labs have been unremarkable.  Patient has had no nausea vomiting or diarrhea while here in the emergency department.  She has had multiple imaging studies in the last several months without specific finding.  I discussed with her the need for a pain clinic to manage her ongoing pain.    Kalman Drape, MD 01/29/14 [id number]  Kalman Drape, MD 01/29/14 [phone]

## 2014-01-29 NOTE — ED Provider Notes (Signed)
Flow manager presented critically high Cyclosporine levels in 600's to me during my shift. Patient seen on 01/28/2014 for vomiting. She is a kidney/pancreas transplant patient.   I called [facility] and spoke with Rosana Fret, MD who is her transplant doctor and made him aware of the critical lab value. He says that she is to follow-up with him soon but that he is not concerned by these results and thanked me for making me aware of the findings.  Will let [facility] handle the results from here.  Linus Mako, PA-C 01/29/14 1905

## 2014-01-30 LAB — URINE CULTURE

## 2014-01-30 NOTE — ED Provider Notes (Signed)
Medical screening examination/treatment/procedure(s) were performed by non-physician practitioner and as supervising physician I was immediately available for consultation/collaboration.   EKG Interpretation None        Blanchard Kelch, MD 01/30/14 [phone]

## 2014-02-04 LAB — CULTURE, BLOOD (ROUTINE X 2): Culture: NO GROWTH

## 2014-02-22 ENCOUNTER — Emergency Department (HOSPITAL_COMMUNITY)
Admission: EM | Admit: 2014-02-22 | Discharge: 2014-02-22 | Disposition: A | Discharge status: Home / Self Care | Payer: Medicare Other | Attending: Emergency Medicine | Admitting: Emergency Medicine

## 2014-02-22 ENCOUNTER — Encounter (HOSPITAL_COMMUNITY): Payer: Self-pay | Admitting: Emergency Medicine

## 2014-02-22 DIAGNOSIS — Z87448 Personal history of other diseases of urinary system: Secondary | ICD-10-CM | POA: Insufficient documentation

## 2014-02-22 DIAGNOSIS — R109 Unspecified abdominal pain: Secondary | ICD-10-CM

## 2014-02-22 DIAGNOSIS — Z8659 Personal history of other mental and behavioral disorders: Secondary | ICD-10-CM | POA: Diagnosis not present

## 2014-02-22 DIAGNOSIS — Z7982 Long term (current) use of aspirin: Secondary | ICD-10-CM | POA: Diagnosis not present

## 2014-02-22 DIAGNOSIS — Z79899 Other long term (current) drug therapy: Secondary | ICD-10-CM | POA: Insufficient documentation

## 2014-02-22 DIAGNOSIS — R112 Nausea with vomiting, unspecified: Secondary | ICD-10-CM | POA: Insufficient documentation

## 2014-02-22 DIAGNOSIS — R1033 Periumbilical pain: Secondary | ICD-10-CM | POA: Diagnosis not present

## 2014-02-22 DIAGNOSIS — G8929 Other chronic pain: Secondary | ICD-10-CM | POA: Insufficient documentation

## 2014-02-22 DIAGNOSIS — Z8719 Personal history of other diseases of the digestive system: Secondary | ICD-10-CM | POA: Diagnosis not present

## 2014-02-22 DIAGNOSIS — R1013 Epigastric pain: Secondary | ICD-10-CM | POA: Diagnosis present

## 2014-02-22 LAB — COMPREHENSIVE METABOLIC PANEL
ALK PHOS: 158 U/L — AB (ref 39–117)
ALT: 20 U/L (ref 0–35)
AST: 17 U/L (ref 0–37)
Albumin: 3.8 g/dL (ref 3.5–5.2)
BILIRUBIN TOTAL: 0.6 mg/dL (ref 0.3–1.2)
BUN: 25 mg/dL — AB (ref 6–23)
CHLORIDE: 107 meq/L (ref 96–112)
CO2: 20 mEq/L (ref 19–32)
Calcium: 9.8 mg/dL (ref 8.4–10.5)
Creatinine, Ser: 1.23 mg/dL — ABNORMAL HIGH (ref 0.50–1.10)
GFR calc non Af Amer: 57 mL/min — ABNORMAL LOW (ref >90)
GFR, EST AFRICAN AMERICAN: 67 mL/min — AB (ref >90)
GLUCOSE: 100 mg/dL — AB (ref 70–99)
POTASSIUM: 5.3 meq/L (ref 3.7–5.3)
Sodium: 139 mEq/L (ref 137–147)
Total Protein: 8.1 g/dL (ref 6.0–8.3)

## 2014-02-22 LAB — URINALYSIS, ROUTINE W REFLEX MICROSCOPIC
BILIRUBIN URINE: NEGATIVE
Glucose, UA: NEGATIVE mg/dL
Hgb urine dipstick: NEGATIVE
KETONES UR: NEGATIVE mg/dL
Leukocytes, UA: NEGATIVE
NITRITE: NEGATIVE
Protein, ur: NEGATIVE mg/dL
Specific Gravity, Urine: 1.021 (ref 1.005–1.030)
UROBILINOGEN UA: 0.2 mg/dL (ref 0.0–1.0)
pH: 5 (ref 5.0–8.0)

## 2014-02-22 LAB — CBC
HCT: 38.1 % (ref 36.0–46.0)
HEMOGLOBIN: 13.6 g/dL (ref 12.0–15.0)
MCH: 30.4 pg (ref 26.0–34.0)
MCHC: 35.7 g/dL (ref 30.0–36.0)
MCV: 85 fL (ref 78.0–100.0)
Platelets: 157 10*3/uL (ref 150–400)
RBC: 4.48 MIL/uL (ref 3.87–5.11)
RDW: 12.4 % (ref 11.5–15.5)
WBC: 3.2 10*3/uL — AB (ref 4.0–10.5)

## 2014-02-22 LAB — I-STAT CG4 LACTIC ACID, ED: Lactic Acid, Venous: 0.91 mmol/L (ref 0.5–2.2)

## 2014-02-22 LAB — LIPASE, BLOOD: LIPASE: 18 U/L (ref 11–59)

## 2014-02-22 MED ORDER — PROMETHAZINE HCL 25 MG RE SUPP
25.0000 mg | Freq: Once | RECTAL | Status: AC
  Administered 2014-02-22: 25 mg via RECTAL
  Filled 2014-02-22: qty 1

## 2014-02-22 MED ORDER — OXYCODONE-ACETAMINOPHEN 5-325 MG PO TABS
2.0000 | ORAL_TABLET | Freq: Once | ORAL | Status: AC
  Administered 2014-02-22: 2 via ORAL
  Filled 2014-02-22: qty 2

## 2014-02-22 MED ORDER — PROMETHAZINE HCL 25 MG RE SUPP: 25.0000 mg | Freq: Four times a day (QID) | RECTAL | Status: DC | PRN

## 2014-02-22 NOTE — ED Notes (Signed)
Bed: [id number] Expected date:  Expected time:  Means of arrival:  Comments: EMS abd pain

## 2014-02-22 NOTE — ED Notes (Signed)
Per EMS pt c/o abdominal pain, onset last night and progressing today. N/V and per pt she has a fever. She is a kidney and pancreas transplant patient, 1 year ago. Pain more centralized in epigastric area.  BP: 120/70 P: 96  R: 20

## 2014-02-22 NOTE — ED Notes (Signed)
Pt refused to put on gown. 

## 2014-02-22 NOTE — Discharge Instructions (Signed)
Abdominal Pain, Women °Abdominal (stomach, pelvic, or belly) pain can be caused by many things. It is important to tell your doctor: °· The location of the pain. °· Does it come and go or is it present all the time? °· Are there things that start the pain (eating certain foods, exercise)? °· Are there other symptoms associated with the pain (fever, nausea, vomiting, diarrhea)? °All of this is helpful to know when trying to find the cause of the pain. °CAUSES  °· Stomach: virus or bacteria infection, or ulcer. °· Intestine: appendicitis (inflamed appendix), regional ileitis (Crohn's disease), ulcerative colitis (inflamed colon), irritable bowel syndrome, diverticulitis (inflamed diverticulum of the colon), or cancer of the stomach or intestine. °· Gallbladder disease or stones in the gallbladder. °· Kidney disease, kidney stones, or infection. °· Pancreas infection or cancer. °· Fibromyalgia (pain disorder). °· Diseases of the female organs: °¨ Uterus: fibroid (non-cancerous) tumors or infection. °¨ Fallopian tubes: infection or tubal pregnancy. °¨ Ovary: cysts or tumors. °¨ Pelvic adhesions (scar tissue). °¨ Endometriosis (uterus lining tissue growing in the pelvis and on the pelvic organs). °¨ Pelvic congestion syndrome (female organs filling up with blood just before the menstrual period). °¨ Pain with the menstrual period. °¨ Pain with ovulation (producing an egg). °¨ Pain with an IUD (intrauterine device, birth control) in the uterus. °¨ Cancer of the female organs. °· Functional pain (pain not caused by a disease, may improve without treatment). °· Psychological pain. °· Depression. °DIAGNOSIS  °Your doctor will decide the seriousness of your pain by doing an examination. °· Blood tests. °· X-rays. °· Ultrasound. °· CT scan (computed tomography, special type of X-ray). °· MRI (magnetic resonance imaging). °· Cultures, for infection. °· Barium enema (dye inserted in the large intestine, to better view it with  X-rays). °· Colonoscopy (looking in intestine with a lighted tube). °· Laparoscopy (minor surgery, looking in abdomen with a lighted tube). °· Major abdominal exploratory surgery (looking in abdomen with a large incision). °TREATMENT  °The treatment will depend on the cause of the pain.  °· Many cases can be observed and treated at home. °· Over-the-counter medicines recommended by your caregiver. °· Prescription medicine. °· Antibiotics, for infection. °· Birth control pills, for painful periods or for ovulation pain. °· Hormone treatment, for endometriosis. °· Nerve blocking injections. °· Physical therapy. °· Antidepressants. °· Counseling with a psychologist or psychiatrist. °· Minor or major surgery. °HOME CARE INSTRUCTIONS  °· Do not take laxatives, unless directed by your caregiver. °· Take over-the-counter pain medicine only if ordered by your caregiver. Do not take aspirin because it can cause an upset stomach or bleeding. °· Try a clear liquid diet (broth or water) as ordered by your caregiver. Slowly move to a bland diet, as tolerated, if the pain is related to the stomach or intestine. °· Have a thermometer and take your temperature several times a day, and record it. °· Bed rest and sleep, if it helps the pain. °· Avoid sexual intercourse, if it causes pain. °· Avoid stressful situations. °· Keep your follow-up appointments and tests, as your caregiver orders. °· If the pain does not go away with medicine or surgery, you may try: °¨ Acupuncture. °¨ Relaxation exercises (yoga, meditation). °¨ Group therapy. °¨ Counseling. °SEEK MEDICAL CARE IF:  °· You notice certain foods cause stomach pain. °· Your home care treatment is not helping your pain. °· You need stronger pain medicine. °· You want your IUD removed. °· You feel faint or   lightheaded. °· You develop nausea and vomiting. °· You develop a rash. °· You are having side effects or an allergy to your medicine. °SEEK IMMEDIATE MEDICAL CARE IF:  °· Your  pain does not go away or gets worse. °· You have a fever. °· Your pain is felt only in portions of the abdomen. The right side could possibly be appendicitis. The left lower portion of the abdomen could be colitis or diverticulitis. °· You are passing blood in your stools (bright red or black tarry stools, with or without vomiting). °· You have blood in your urine. °· You develop chills, with or without a fever. °· You pass out. °MAKE SURE YOU:  °· Understand these instructions. °· Will watch your condition. °· Will get help right away if you are not doing well or get worse. °Document Released: 06/18/2007 Document Revised: 11/13/2011 Document Reviewed: 07/08/2009 °ExitCare® Patient Information ©2015 [employer]. This information is not intended to replace advice given to you by your health care provider. Make sure you discuss any questions you have with your health care provider. ° °

## 2014-02-22 NOTE — ED Provider Notes (Signed)
CSN: [id number]     Arrival date & time 02/22/14  1927 History   First MD Initiated Contact with Patient 02/22/14 1932     Chief Complaint  Patient presents with  . Abdominal Pain     (Consider location/radiation/quality/duration/timing/severity/associated sxs/prior Treatment) Patient is a 33 y.o. female presenting with abdominal pain. The history is provided by the patient.  Abdominal Pain Pain location:  Epigastric and periumbilical Pain quality: sharp   Pain radiates to:  Does not radiate Pain severity:  Severe Onset quality:  Gradual Duration:  2 days Timing:  Constant Progression:  Worsening Chronicity:  Chronic Context: not sick contacts and not trauma   Relieved by:  Nothing Worsened by:  Nothing tried Ineffective treatments: PO Dilaudid. Associated symptoms: nausea and vomiting   Associated symptoms: no cough, no fever and no shortness of breath     Past Medical History  Diagnosis Date  . Vision loss   . Renal disorder   . Anxiety   . Gastroparesis   . Narcotic abuse   . Non compliance with medical treatment    Past Surgical History  Procedure Laterality Date  . Refractive surgery    . Eye surgery    . Av fistula placement  11/17/2011    Procedure: ARTERIOVENOUS (AV) FISTULA CREATION;  Surgeon: Rosetta Posner, MD;  Location: [facility];  Service: Vascular;  Laterality: Right;  . Kidney transplant    . Pancrease transplant     Family History  Problem Relation Age of Onset  . Hypertension Father    History  Substance Use Topics  . Smoking status: Never Smoker   . Smokeless tobacco: Never Used     Comment: Hooka  . Alcohol Use: No   OB History   Grav Para Term Preterm Abortions TAB SAB Ect Mult Living                 Review of Systems  Constitutional: Negative for fever.  Respiratory: Negative for cough and shortness of breath.   Gastrointestinal: Positive for nausea, vomiting and abdominal pain.  All other systems reviewed and are  negative.     Allergies  Fish-derived products and Shrimp  Home Medications   Prior to Admission medications   Medication Sig Start Date End Date Taking? Authorizing Provider  aspirin EC 81 MG tablet Take 81 mg by mouth every morning.   Yes Historical Provider, MD  cycloSPORINE (SANDIMMUNE) 100 MG capsule Take 300 mg by mouth 2 (two) times daily.    Yes Historical Provider, MD  metoCLOPramide (REGLAN) 10 MG tablet Take 10 mg by mouth 3 (three) times daily.   Yes Historical Provider, MD  mycophenolate (MYFORTIC) 180 MG EC tablet Take 540 mg by mouth 2 (two) times daily.   Yes Historical Provider, MD   BP 134/77  Pulse 87  Temp(Src) 98.3 F (36.8 C) (Oral)  Resp 20  SpO2 100%  LMP 02/17/2014 Physical Exam  Nursing note and vitals reviewed. Constitutional: She is oriented to person, place, and time. She appears well-developed and well-nourished. No distress.  HENT:  Head: Normocephalic and atraumatic.  Mouth/Throat: Oropharynx is clear and moist.  Eyes: EOM are normal. Pupils are equal, round, and reactive to light.  Neck: Normal range of motion. Neck supple.  Cardiovascular: Normal rate and regular rhythm.  Exam reveals no friction rub.   No murmur heard. Pulmonary/Chest: Effort normal and breath sounds normal. No respiratory distress. She has no wheezes. She has no rales.  Abdominal: Soft. She  exhibits no distension. There is tenderness (epigastric, periumbilical). There is no rebound.  Musculoskeletal: Normal range of motion. She exhibits no edema.  Neurological: She is alert and oriented to person, place, and time.  Skin: She is not diaphoretic.    ED Course  Procedures (including critical care time) Labs Review Labs Reviewed  CBC - Abnormal; Notable for the following:    WBC 3.2 (*)    All other components within normal limits  COMPREHENSIVE METABOLIC PANEL  LIPASE, BLOOD  URINALYSIS, ROUTINE W REFLEX MICROSCOPIC  I-STAT CG4 LACTIC ACID, ED    Imaging  Review No results found.   EKG Interpretation None      MDM   Final diagnoses:  Chronic abdominal pain    6F with hx of pancreas and kidney transplant presents with abdominal pain. Hx of chronic pain and narcotic seeking behavior. Seen one week ago at [facility] in [place], [place]. Was given Percocet, left when she did not receive IV narcotics. Today states fever of 103 at home - took tylenol per her. No fever here. Abdominal pain worsened last night with nausea and vomiting. No active vomiting here. No diarrhea.  On exam, belly soft with epigastric, periumbilical tenderness. States much worse than prior. No peritoneal signs. Will start with labs, phenergan suppository, PO percocet.  Likely here for narcotic pain medicines, however if labs abnormal, will consider CT scan.  Labs reassuring. Stable for discharge.   Osvaldo Shipper, MD 02/22/14 [phone]

## 2014-04-28 DIAGNOSIS — L709 Acne, unspecified: Secondary | ICD-10-CM | POA: Insufficient documentation

## 2014-04-28 DIAGNOSIS — L299 Pruritus, unspecified: Secondary | ICD-10-CM | POA: Insufficient documentation

## 2014-05-30 IMAGING — US US PELVIS COMPLETE
1 series · 13 of 25 positions shown · non-contrast
Comparison: Pelvic ultrasound [DATE]

CLINICAL DATA: 35 y/o F; gradual onset of stabbing type pelvic pain
for 1 week. History of fibroids. LMP [DATE]



[Series 1: us pelvis complete · 0.24mm/px · 69 acquisitions, 13 frames shown]
[im 1/69]
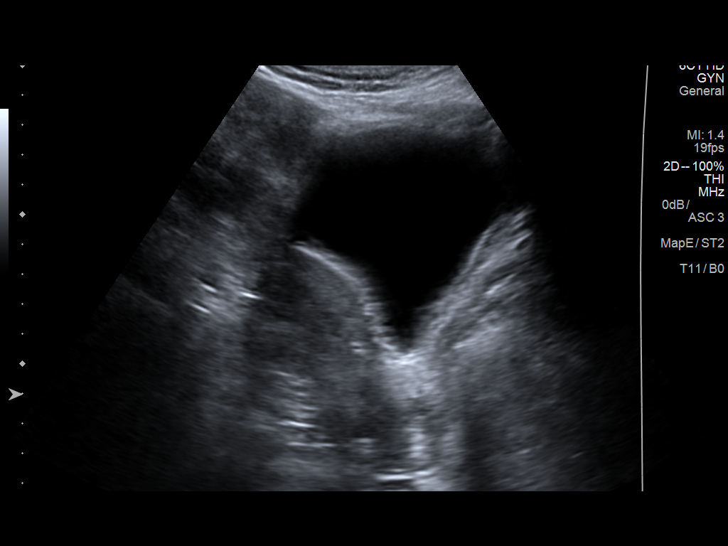
[im 6/69]
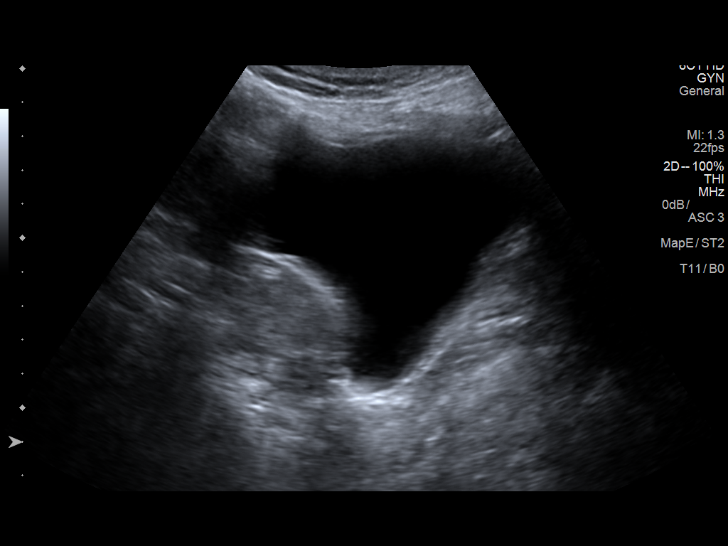
[im 12/69]
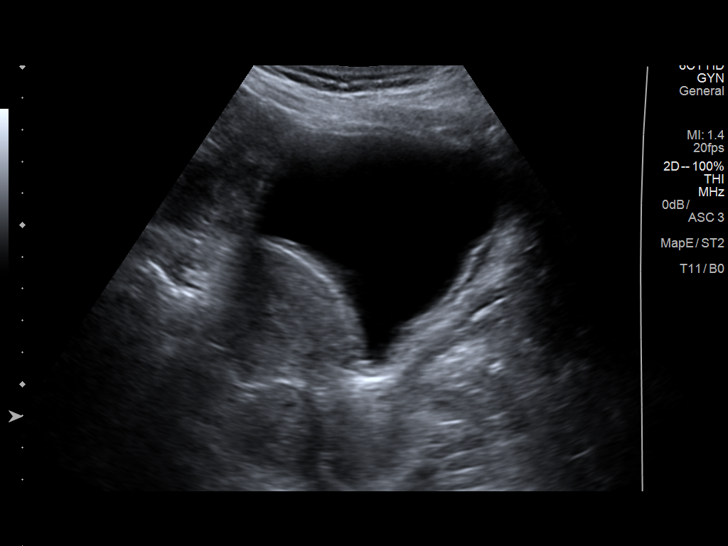
[im 18/69]
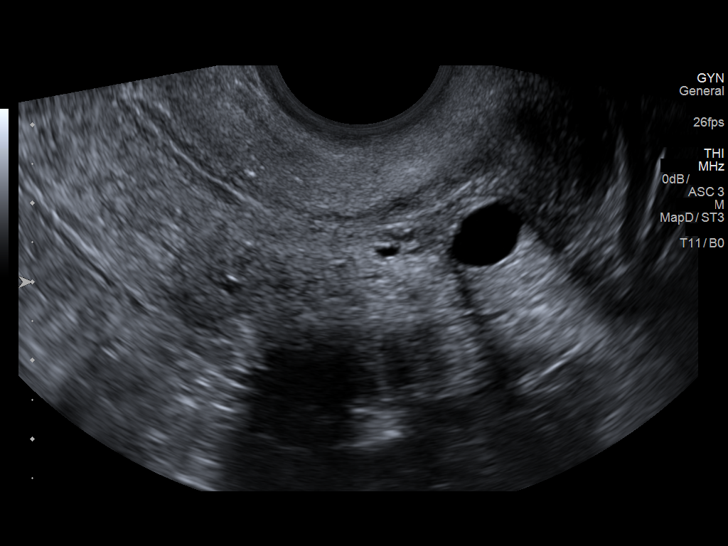
[im 23/69]
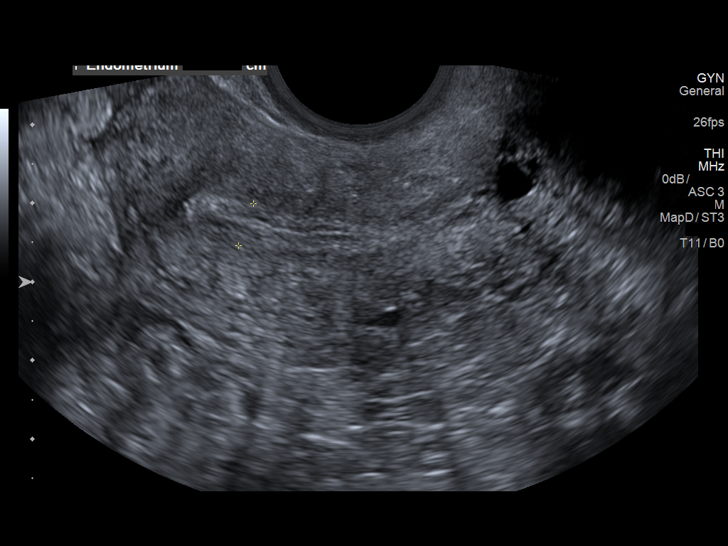
[im 29/69]
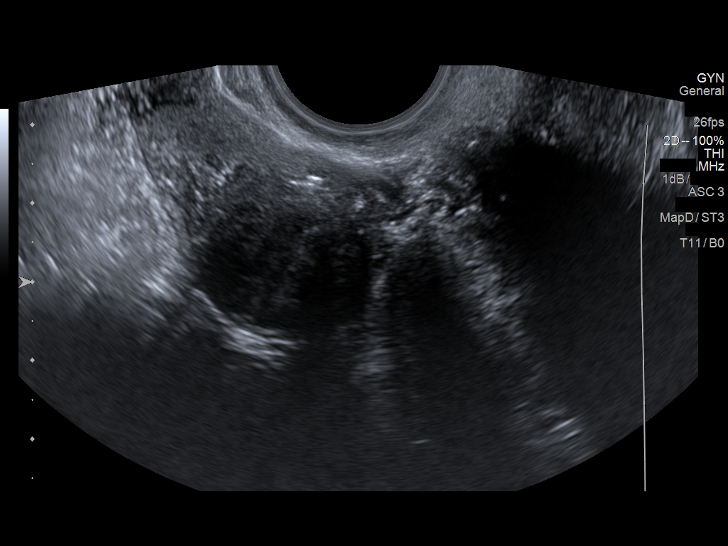
[im 35/69]
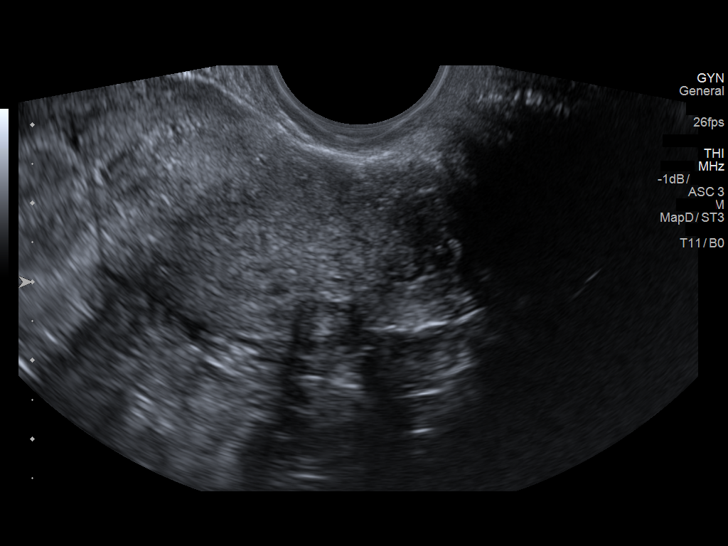
[im 40/69]
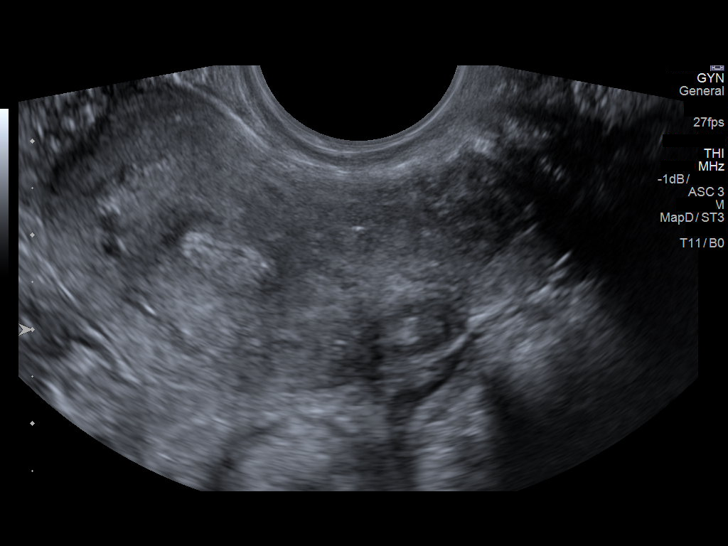
[im 46/69]
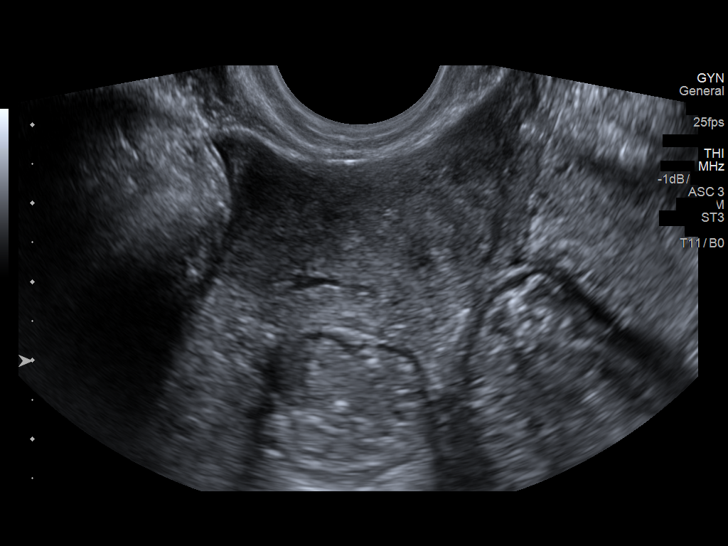
[im 52/69]
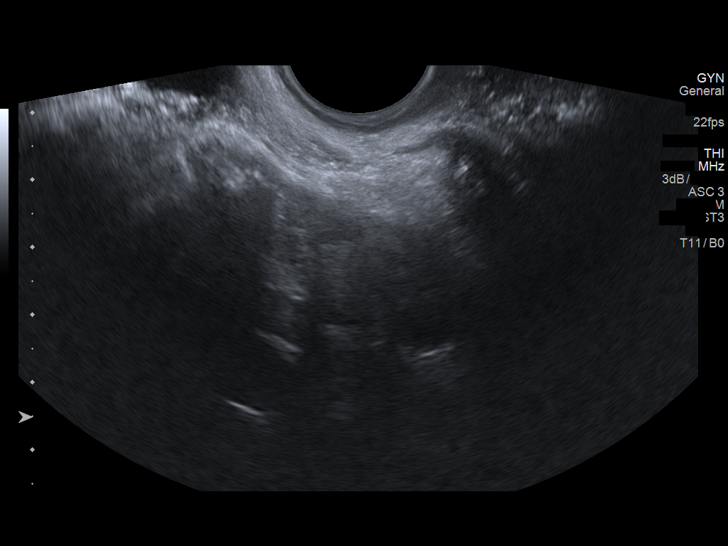
[im 57/69]
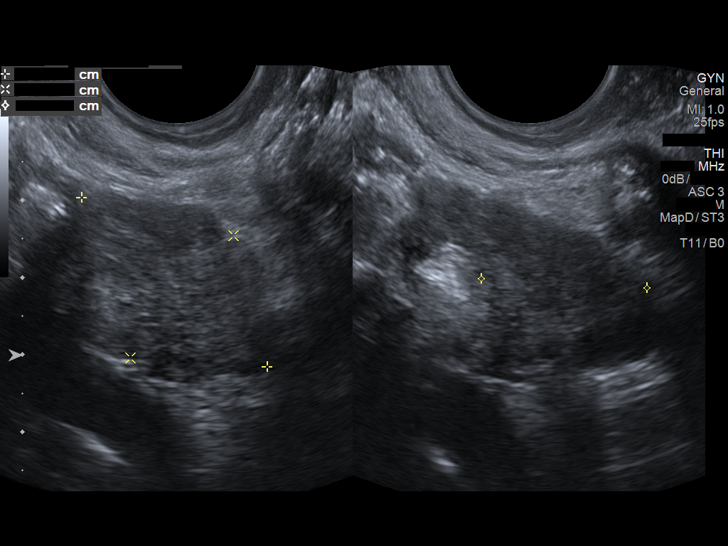
[im 63/69]
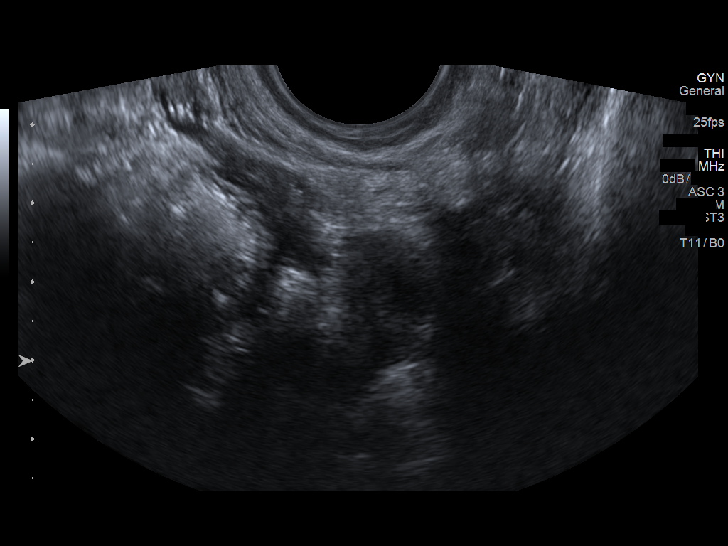
[im 69/69]
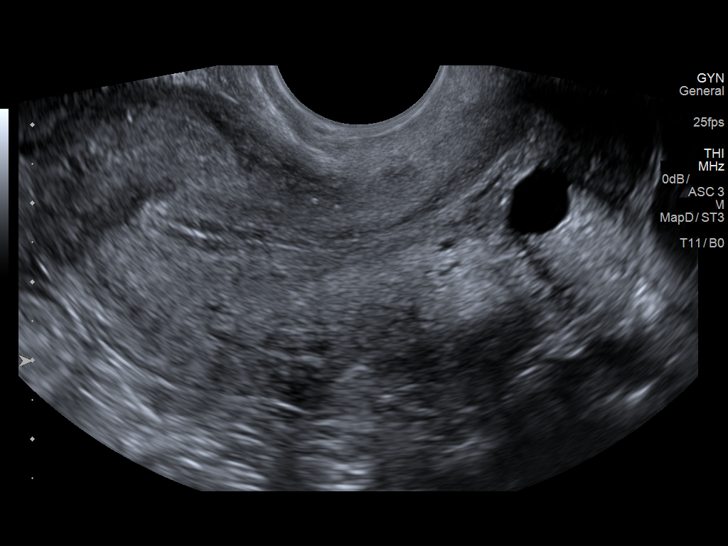

[13 of 25 positions shown; findings below may reference images not displayed]

FINDINGS: Uterus

Measurements: 9.3 x 4.3 x 4.3 cm transabdominal and 6.7 x 2.9 x
cm transvaginal. Multiple uterine fibroids are without gross
interval change in comparison with prior pelvic sonogram measuring
up to 2.9 cm in the posterior lower sub serosal region. Exophytic
sub serosal uterine fundus fibroid measuring 1.3 cm also noted.
Additional posterior uterine myometrial fibroids. Nabothian cysts
measuring up to 1.1 cm.

Endometrium

Thickness: 5.7 mm.  No focal abnormality visualized.

Right ovary

Measurements: Not visualized. No mass lesion in the right adnexa
identified.

Left ovary

Measurements: 3.3 x 2.1 x 2.2 cm. Normal appearance/no adnexal mass.

Other findings

No abnormal free fluid.
IMPRESSION: Myomatous uterus without interval change from [DATE]. Normal
left ovary. Right ovary was not visualized. No acute process
identified.

By: RADA M.D.

## 2014-08-26 DIAGNOSIS — Z79899 Other long term (current) drug therapy: Secondary | ICD-10-CM | POA: Insufficient documentation

## 2014-09-01 DIAGNOSIS — N83202 Unspecified ovarian cyst, left side: Secondary | ICD-10-CM | POA: Insufficient documentation

## 2014-09-03 DIAGNOSIS — T8691 Unspecified transplanted organ and tissue rejection: Secondary | ICD-10-CM | POA: Insufficient documentation

## 2014-09-07 ENCOUNTER — Encounter (HOSPITAL_COMMUNITY): Payer: Self-pay | Admitting: Emergency Medicine

## 2014-09-07 ENCOUNTER — Emergency Department (HOSPITAL_COMMUNITY)
Admission: EM | Admit: 2014-09-07 | Discharge: 2014-09-08 | Disposition: A | Discharge status: Home / Self Care | Payer: Medicare Other | Attending: Emergency Medicine | Admitting: Emergency Medicine

## 2014-09-07 DIAGNOSIS — Z7982 Long term (current) use of aspirin: Secondary | ICD-10-CM | POA: Diagnosis not present

## 2014-09-07 DIAGNOSIS — Z3202 Encounter for pregnancy test, result negative: Secondary | ICD-10-CM | POA: Diagnosis not present

## 2014-09-07 DIAGNOSIS — Z940 Kidney transplant status: Secondary | ICD-10-CM | POA: Diagnosis not present

## 2014-09-07 DIAGNOSIS — Z8719 Personal history of other diseases of the digestive system: Secondary | ICD-10-CM | POA: Diagnosis not present

## 2014-09-07 DIAGNOSIS — B009 Herpesviral infection, unspecified: Secondary | ICD-10-CM | POA: Insufficient documentation

## 2014-09-07 DIAGNOSIS — N186 End stage renal disease: Secondary | ICD-10-CM | POA: Diagnosis not present

## 2014-09-07 DIAGNOSIS — Z7952 Long term (current) use of systemic steroids: Secondary | ICD-10-CM | POA: Diagnosis not present

## 2014-09-07 DIAGNOSIS — N8320 Unspecified ovarian cysts: Secondary | ICD-10-CM | POA: Diagnosis not present

## 2014-09-07 DIAGNOSIS — G8929 Other chronic pain: Secondary | ICD-10-CM | POA: Diagnosis not present

## 2014-09-07 DIAGNOSIS — Z79899 Other long term (current) drug therapy: Secondary | ICD-10-CM | POA: Diagnosis not present

## 2014-09-07 DIAGNOSIS — R109 Unspecified abdominal pain: Secondary | ICD-10-CM

## 2014-09-07 DIAGNOSIS — Z9119 Patient's noncompliance with other medical treatment and regimen: Secondary | ICD-10-CM | POA: Insufficient documentation

## 2014-09-07 DIAGNOSIS — D631 Anemia in chronic kidney disease: Secondary | ICD-10-CM

## 2014-09-07 DIAGNOSIS — N83202 Unspecified ovarian cyst, left side: Secondary | ICD-10-CM

## 2014-09-07 DIAGNOSIS — N189 Chronic kidney disease, unspecified: Secondary | ICD-10-CM

## 2014-09-07 DIAGNOSIS — H547 Unspecified visual loss: Secondary | ICD-10-CM | POA: Diagnosis not present

## 2014-09-07 DIAGNOSIS — R1032 Left lower quadrant pain: Secondary | ICD-10-CM | POA: Diagnosis present

## 2014-09-07 DIAGNOSIS — Z8659 Personal history of other mental and behavioral disorders: Secondary | ICD-10-CM | POA: Insufficient documentation

## 2014-09-07 DIAGNOSIS — Z992 Dependence on renal dialysis: Secondary | ICD-10-CM | POA: Insufficient documentation

## 2014-09-07 LAB — CBC WITH DIFFERENTIAL/PLATELET
Basophils Absolute: 0 10*3/uL (ref 0.0–0.1)
Basophils Relative: 0 % (ref 0–1)
EOS PCT: 4 % (ref 0–5)
Eosinophils Absolute: 0.2 10*3/uL (ref 0.0–0.7)
HEMATOCRIT: 35.8 % — AB (ref 36.0–46.0)
HEMOGLOBIN: 12.4 g/dL (ref 12.0–15.0)
LYMPHS ABS: 0.6 10*3/uL — AB (ref 0.7–4.0)
LYMPHS PCT: 11 % — AB (ref 12–46)
MCH: 30.8 pg (ref 26.0–34.0)
MCHC: 34.6 g/dL (ref 30.0–36.0)
MCV: 88.8 fL (ref 78.0–100.0)
MONOS PCT: 10 % (ref 3–12)
Monocytes Absolute: 0.6 10*3/uL (ref 0.1–1.0)
Neutro Abs: 4.5 10*3/uL (ref 1.7–7.7)
Neutrophils Relative %: 75 % (ref 43–77)
Platelets: 183 10*3/uL (ref 150–400)
RBC: 4.03 MIL/uL (ref 3.87–5.11)
RDW: 13.7 % (ref 11.5–15.5)
WBC: 5.9 10*3/uL (ref 4.0–10.5)

## 2014-09-07 LAB — COMPREHENSIVE METABOLIC PANEL WITH GFR
ALT: 37 U/L — ABNORMAL HIGH (ref 0–35)
AST: 16 U/L (ref 0–37)
Albumin: 3.6 g/dL (ref 3.5–5.2)
Alkaline Phosphatase: 146 U/L — ABNORMAL HIGH (ref 39–117)
Anion gap: 6 (ref 5–15)
BUN: 40 mg/dL — ABNORMAL HIGH (ref 6–23)
CO2: 20 mmol/L (ref 19–32)
Calcium: 8.8 mg/dL (ref 8.4–10.5)
Chloride: 112 meq/L (ref 96–112)
Creatinine, Ser: 1.62 mg/dL — ABNORMAL HIGH (ref 0.50–1.10)
GFR calc Af Amer: 48 mL/min — ABNORMAL LOW (ref >90)
GFR calc non Af Amer: 41 mL/min — ABNORMAL LOW (ref >90)
Glucose, Bld: 99 mg/dL (ref 70–99)
Potassium: 5.2 mmol/L — ABNORMAL HIGH (ref 3.5–5.1)
Sodium: 138 mmol/L (ref 135–145)
Total Bilirubin: 0.9 mg/dL (ref 0.3–1.2)
Total Protein: 7.3 g/dL (ref 6.0–8.3)

## 2014-09-07 LAB — LIPASE, BLOOD: Lipase: 25 U/L (ref 11–59)

## 2014-09-07 MED ORDER — SODIUM CHLORIDE 0.9 % IV SOLN: 1000.0000 mL | INTRAVENOUS | Status: DC

## 2014-09-07 MED ORDER — ONDANSETRON HCL 4 MG/2ML IJ SOLN
4.0000 mg | Freq: Once | INTRAMUSCULAR | Status: AC
  Administered 2014-09-07: 4 mg via INTRAVENOUS
  Filled 2014-09-07: qty 2

## 2014-09-07 MED ORDER — SODIUM CHLORIDE 0.9 % IV SOLN
1000.0000 mL | Freq: Once | INTRAVENOUS | Status: AC
  Administered 2014-09-07: 1000 mL via INTRAVENOUS

## 2014-09-07 MED ORDER — HYDROMORPHONE HCL 1 MG/ML IJ SOLN
1.0000 mg | Freq: Once | INTRAMUSCULAR | Status: AC
  Administered 2014-09-07: 1 mg via INTRAVENOUS
  Filled 2014-09-07: qty 1

## 2014-09-07 NOTE — ED Provider Notes (Signed)
CSN: [id number]     Arrival date & time 09/07/14  2141 History   First MD Initiated Contact with Patient 09/07/14 2154     Chief Complaint  Patient presents with  . Abdominal Pain     (Consider location/radiation/quality/duration/timing/severity/associated sxs/prior Treatment) The history is provided by the patient and medical records. No language interpreter was used.      [name] [name] is a 34 y.o. female  with a hx of anxiety, narcotic abuse, noncompliance with medical treatment presents to the Emergency Department complaining of gradual, persistent, progressively worsening LLQ abd pain onset 8:30PM while smoking at the [facility].   Pt reports she had a renal biopsy on 08/31/14 with persistent pain.  At that time she was dx with large hemorrhagic ovarian cyst.  Pt was d/c home on 09/03/14.  She reports feeling well until today when she went out with her friends.  Pt also c/o burning and itching vaginal rash.  Pt reports using Desatin without relief.  No rigidity or alleviating factors. Associated symptoms.  Patient denies fever, chills, headache, neck pain, chest pain, shortness of breath, nausea, vomiting, diarrhea, weakness, dizziness, syncope, dysuria, hematuria.   Past Medical History  Diagnosis Date  . Vision loss   . Renal disorder   . Anxiety   . Gastroparesis   . Narcotic abuse   . Non compliance with medical treatment    Past Surgical History  Procedure Laterality Date  . Refractive surgery    . Eye surgery    . Av fistula placement  11/17/2011    Procedure: ARTERIOVENOUS (AV) FISTULA CREATION;  Surgeon: Rosetta Posner, MD;  Location: [facility];  Service: Vascular;  Laterality: Right;  . Kidney transplant    . Pancrease transplant     Family History  Problem Relation Age of Onset  . Hypertension Father    History  Substance Use Topics  . Smoking status: Never Smoker   . Smokeless tobacco: Never Used     Comment: Hooka  . Alcohol Use: No   OB History    No data  available     Review of Systems  Constitutional: Negative for fever, diaphoresis, appetite change, fatigue and unexpected weight change.  HENT: Negative for mouth sores and trouble swallowing.   Respiratory: Negative for cough, chest tightness, shortness of breath, wheezing and stridor.   Cardiovascular: Negative for chest pain and palpitations.  Gastrointestinal: Positive for abdominal pain. Negative for nausea, vomiting, diarrhea, constipation, blood in stool, abdominal distention and rectal pain.  Genitourinary: Negative for dysuria, urgency, frequency, hematuria, flank pain and difficulty urinating.  Musculoskeletal: Negative for back pain, neck pain and neck stiffness.  Skin: Positive for rash.  Neurological: Negative for weakness.  Hematological: Negative for adenopathy.  Psychiatric/Behavioral: Negative for confusion.  All other systems reviewed and are negative.     Allergies  Fish-derived products and Shrimp  Home Medications   Prior to Admission medications   Medication Sig Start Date End Date Taking? Authorizing Provider  aspirin EC 81 MG tablet Take 81 mg by mouth every morning.   Yes Historical Provider, MD  cycloSPORINE (SANDIMMUNE) 100 MG capsule Take 300 mg by mouth 2 (two) times daily.    Yes Historical Provider, MD  metoCLOPramide (REGLAN) 10 MG tablet Take 10 mg by mouth 3 (three) times daily.   Yes Historical Provider, MD  mycophenolate (MYFORTIC) 180 MG EC tablet Take 540 mg by mouth 2 (two) times daily.   Yes Historical Provider, MD  predniSONE (DELTASONE)  10 MG tablet Take 20 mg by mouth daily with breakfast.   Yes Historical Provider, MD  acyclovir (ZOVIRAX) 400 MG tablet Take 2 tablets (800 mg total) by mouth 5 (five) times daily. 09/08/14   Keylie Beavers, PA-C  promethazine (PHENERGAN) 25 MG suppository Place 1 suppository (25 mg total) rectally every 6 (six) hours as needed for nausea or vomiting. Patient not taking: Reported on 09/07/2014 02/22/14    Evelina Bucy, MD   BP 136/70 mmHg  Pulse 82  Temp(Src) 98 F (36.7 C) (Oral)  Resp 13  SpO2 100%  LMP 08/04/2014 Physical Exam  Constitutional: She appears well-developed and well-nourished. No distress.  Awake, alert, nontoxic appearance  HENT:  Head: Normocephalic and atraumatic.  Mouth/Throat: Oropharynx is clear and moist. No oropharyngeal exudate.  Eyes: Conjunctivae are normal. No scleral icterus.  Neck: Normal range of motion. Neck supple.  Cardiovascular: Normal rate, regular rhythm and intact distal pulses.   Pulmonary/Chest: Effort normal and breath sounds normal. No respiratory distress. She has no wheezes.  Equal chest expansion  Abdominal: Soft. Bowel sounds are normal. She exhibits no distension and no mass. There is no tenderness. There is no rebound, no guarding and no CVA tenderness.  Abdomen is soft and nontender No rebound, guarding or peritoneal signs No CVA tenderness Multiple well-healed surgical scars  Genitourinary:  Small, ulcerated lesions located around the anus and on the labia majora consistent with genital herpes  Musculoskeletal: Normal range of motion. She exhibits no edema.  Neurological: She is alert.  Speech is clear and goal oriented Moves extremities without ataxia  Skin: Skin is warm and dry. She is not diaphoretic.  Psychiatric: She has a normal mood and affect.  Nursing note and vitals reviewed.   ED Course  Procedures (including critical care time) Labs Review Labs Reviewed  CBC WITH DIFFERENTIAL - Abnormal; Notable for the following:    HCT 35.8 (*)    Lymphocytes Relative 11 (*)    Lymphs Abs 0.6 (*)    All other components within normal limits  COMPREHENSIVE METABOLIC PANEL - Abnormal; Notable for the following:    Potassium 5.2 (*)    BUN 40 (*)    Creatinine, Ser 1.62 (*)    ALT 37 (*)    Alkaline Phosphatase 146 (*)    GFR calc non Af Amer 41 (*)    GFR calc Af Amer 48 (*)    All other components within normal limits   LIPASE, BLOOD  URINALYSIS, ROUTINE W REFLEX MICROSCOPIC  POC URINE PREG, ED    Imaging Review No results found.   EKG Interpretation None      MDM   Final diagnoses:  Chronic abdominal pain  Anemia in chronic kidney disease (CKD)  End stage renal disease  Left ovarian cyst  Herpes simplex type II infection   Olyvia Hovland patient is well known to the emergency department for issues of chronic abdominal pain, narcotic abuse and noncompliant with her therapies. Patient was seen on 1228 at [facility] and discharged on 09/03/2014. At that time her BUN was 33, creatinine 1.66, hemoglobin 9.3, ALT 53, AST 20, alkaline phosphatase 133.  She had a CT scan while admitted which showed a large, hemorrhagic left ovarian cyst. OB/GYN was consult and recommended outpatient follow-up.  2:27 AM Patient's labs unchanged from those seen at [facility] on discharge date of 09/03/2014.  Patient's worsening renal function is now baseline for her. Patient given pain medication here in the  emergency department. On evaluation genital rashes consistent with genital herpes, will begin acyclovir. Patient will now be discharged home with pain medications. She should see her primary care physician for further narcotic usage.  Patient is nontoxic, nonseptic appearing, in no apparent distress.  Patient's pain and other symptoms adequately managed in emergency department.  Fluid bolus given.  Labs, outside records and vitals reviewed.  Patient does not meet the SIRS or Sepsis criteria.  On repeat exam patient does not have a surgical abdomin and there are no peritoneal signs.  No indication of appendicitis, bowel obstruction, bowel perforation, cholecystitis, diverticulitis, PID or ectopic pregnancy.    I have personally reviewed patient's vitals, nursing note and any pertinent labs or imaging.  I performed an undressed physical exam.    It has been determined that no acute conditions  requiring further emergency intervention are present at this time. The patient/guardian have been advised of the diagnosis and plan. I reviewed all labs and imaging including any potential incidental findings. We have discussed signs and symptoms that warrant return to the ED and they are listed in the discharge instructions.    Vital signs are stable at discharge.   BP 136/70 mmHg  Pulse 82  Temp(Src) 98 F (36.7 C) (Oral)  Resp 13  SpO2 100%  LMP 08/04/2014        Jarrett Soho Tyger Wichman, PA-C 09/08/14 [id number]  Quintella Reichert, MD 09/08/14 [phone]

## 2014-09-07 NOTE — ED Notes (Addendum)
Pt had  biopsy 3 days ago of kidney (hx kidney transplant) and pancreas. Pt reports site of biopsy is painful and pt reports vomiting an hour ago. Pt states when she vomited abdominal pain became worse. Pt is alert and oriented. Pt states she was told she had a hemorrhage at site of biopsy. Pt also reports being told she has ovarian cysts and also reports having a rash on her buttocks.

## 2014-09-07 NOTE — ED Notes (Signed)
Bed: [id number] Expected date:  Expected time:  Means of arrival:  Comments: EMS 33yo abd pain

## 2014-09-08 DIAGNOSIS — N8320 Unspecified ovarian cysts: Secondary | ICD-10-CM | POA: Diagnosis not present

## 2014-09-08 LAB — URINALYSIS, ROUTINE W REFLEX MICROSCOPIC
Bilirubin Urine: NEGATIVE
GLUCOSE, UA: NEGATIVE mg/dL
Hgb urine dipstick: NEGATIVE
Ketones, ur: NEGATIVE mg/dL
Leukocytes, UA: NEGATIVE
Nitrite: NEGATIVE
PH: 6.5 (ref 5.0–8.0)
Protein, ur: NEGATIVE mg/dL
Specific Gravity, Urine: 1.011 (ref 1.005–1.030)
Urobilinogen, UA: 1 mg/dL (ref 0.0–1.0)

## 2014-09-08 LAB — POC URINE PREG, ED: Preg Test, Ur: NEGATIVE

## 2014-09-08 MED ORDER — ACYCLOVIR 400 MG PO TABS: 800.0000 mg | ORAL_TABLET | Freq: Every day | ORAL | Status: DC

## 2014-09-08 MED ORDER — HYDROMORPHONE HCL 1 MG/ML IJ SOLN
1.0000 mg | Freq: Once | INTRAMUSCULAR | Status: AC
  Administered 2014-09-08: 1 mg via INTRAVENOUS
  Filled 2014-09-08: qty 1

## 2014-09-08 NOTE — Discharge Instructions (Signed)
1. Medications: acyclovir, usual home medications 2. Treatment: rest, drink plenty of fluids,  3. Follow Up: Please followup with your primary doctor in 3 days for discussion of your diagnoses and further evaluation after today's visit; if you do not have a primary care doctor use the resource guide provided to find one; Please return to the ER for worsening symptoms   Genital Herpes Genital herpes is a sexually transmitted disease. This means that it is a disease passed by having sex with an infected person. There is no cure for genital herpes. The time between attacks can be months to years. The virus may live in a person but produce no problems (symptoms). This infection can be passed to a baby as it travels down the birth canal (vagina). In a newborn, this can cause central nervous system damage, eye damage, or even death. The virus that causes genital herpes is usually HSV-2 virus. The virus that causes oral herpes is usually HSV-1. The diagnosis (learning what is wrong) is made through culture results. SYMPTOMS  Usually symptoms of pain and itching begin a few days to a week after contact. It first appears as small blisters that progress to small painful ulcers which then scab over and heal after several days. It affects the outer genitalia, birth canal, cervix, penis, anal area, buttocks, and thighs. HOME CARE INSTRUCTIONS   Keep ulcerated areas dry and clean.  Take medications as directed. Antiviral medications can speed up healing. They will not prevent recurrences or cure this infection. These medications can also be taken for suppression if there are frequent recurrences.  While the infection is active, it is contagious. Avoid all sexual contact during active infections.  Condoms may help prevent spread of the herpes virus.  Practice safe sex.  Wash your hands thoroughly after touching the genital area.  Avoid touching your eyes after touching your genital area.  Inform your  caregiver if you have had genital herpes and become pregnant. It is your responsibility to insure a safe outcome for your baby in this pregnancy.  Only take over-the-counter or prescription medicines for pain, discomfort, or fever as directed by your caregiver. SEEK MEDICAL CARE IF:   You have a recurrence of this infection.  You do not respond to medications and are not improving.  You have new sources of pain or discharge which have changed from the original infection.  You have an oral temperature above 102 F (38.9 C).  You develop abdominal pain.  You develop eye pain or signs of eye infection. Document Released: 08/18/2000 Document Revised: 11/13/2011 Document Reviewed: 09/08/2009 [facility] Patient Information 2015 [place], [place]. This information is not intended to replace advice given to you by your health care provider. Make sure you discuss any questions you have with your health care provider.

## 2014-10-29 ENCOUNTER — Emergency Department (HOSPITAL_COMMUNITY)
Admission: EM | Admit: 2014-10-29 | Discharge: 2014-10-29 | Disposition: A | Discharge status: Home / Self Care | Payer: Medicare Other | Attending: Emergency Medicine | Admitting: Emergency Medicine

## 2014-10-29 ENCOUNTER — Encounter (HOSPITAL_COMMUNITY): Payer: Self-pay | Admitting: Emergency Medicine

## 2014-10-29 DIAGNOSIS — H66014 Acute suppurative otitis media with spontaneous rupture of ear drum, recurrent, right ear: Secondary | ICD-10-CM | POA: Diagnosis not present

## 2014-10-29 DIAGNOSIS — H66001 Acute suppurative otitis media without spontaneous rupture of ear drum, right ear: Secondary | ICD-10-CM

## 2014-10-29 DIAGNOSIS — Z8719 Personal history of other diseases of the digestive system: Secondary | ICD-10-CM | POA: Diagnosis not present

## 2014-10-29 DIAGNOSIS — Z9889 Other specified postprocedural states: Secondary | ICD-10-CM | POA: Insufficient documentation

## 2014-10-29 DIAGNOSIS — Z9119 Patient's noncompliance with other medical treatment and regimen: Secondary | ICD-10-CM | POA: Diagnosis not present

## 2014-10-29 DIAGNOSIS — Z792 Long term (current) use of antibiotics: Secondary | ICD-10-CM | POA: Insufficient documentation

## 2014-10-29 DIAGNOSIS — N390 Urinary tract infection, site not specified: Secondary | ICD-10-CM | POA: Diagnosis not present

## 2014-10-29 DIAGNOSIS — Z7982 Long term (current) use of aspirin: Secondary | ICD-10-CM | POA: Diagnosis not present

## 2014-10-29 DIAGNOSIS — Z940 Kidney transplant status: Secondary | ICD-10-CM | POA: Diagnosis not present

## 2014-10-29 DIAGNOSIS — R1084 Generalized abdominal pain: Secondary | ICD-10-CM | POA: Diagnosis present

## 2014-10-29 DIAGNOSIS — Z8659 Personal history of other mental and behavioral disorders: Secondary | ICD-10-CM | POA: Diagnosis not present

## 2014-10-29 DIAGNOSIS — Z79899 Other long term (current) drug therapy: Secondary | ICD-10-CM | POA: Diagnosis not present

## 2014-10-29 LAB — CBC WITH DIFFERENTIAL/PLATELET
BASOS PCT: 0 % (ref 0–1)
Basophils Absolute: 0 10*3/uL (ref 0.0–0.1)
EOS ABS: 0.1 10*3/uL (ref 0.0–0.7)
Eosinophils Relative: 2 % (ref 0–5)
HEMATOCRIT: 44.4 % (ref 36.0–46.0)
Hemoglobin: 14.7 g/dL (ref 12.0–15.0)
Lymphocytes Relative: 10 % — ABNORMAL LOW (ref 12–46)
Lymphs Abs: 0.8 10*3/uL (ref 0.7–4.0)
MCH: 30.2 pg (ref 26.0–34.0)
MCHC: 33.1 g/dL (ref 30.0–36.0)
MCV: 91.4 fL (ref 78.0–100.0)
MONO ABS: 0.7 10*3/uL (ref 0.1–1.0)
Monocytes Relative: 9 % (ref 3–12)
Neutro Abs: 6 10*3/uL (ref 1.7–7.7)
Neutrophils Relative %: 79 % — ABNORMAL HIGH (ref 43–77)
Platelets: 202 10*3/uL (ref 150–400)
RBC: 4.86 MIL/uL (ref 3.87–5.11)
RDW: 13.2 % (ref 11.5–15.5)
WBC: 7.5 10*3/uL (ref 4.0–10.5)

## 2014-10-29 LAB — COMPREHENSIVE METABOLIC PANEL
ALT: 15 U/L (ref 0–35)
AST: 13 U/L (ref 0–37)
Albumin: 3.8 g/dL (ref 3.5–5.2)
Alkaline Phosphatase: 145 U/L — ABNORMAL HIGH (ref 39–117)
Anion gap: 10 (ref 5–15)
BILIRUBIN TOTAL: 0.5 mg/dL (ref 0.3–1.2)
BUN: 38 mg/dL — ABNORMAL HIGH (ref 6–23)
CHLORIDE: 106 mmol/L (ref 96–112)
CO2: 22 mmol/L (ref 19–32)
CREATININE: 2.32 mg/dL — AB (ref 0.50–1.10)
Calcium: 9.6 mg/dL (ref 8.4–10.5)
GFR calc Af Amer: 31 mL/min — ABNORMAL LOW (ref >90)
GFR, EST NON AFRICAN AMERICAN: 27 mL/min — AB (ref >90)
Glucose, Bld: 82 mg/dL (ref 70–99)
Potassium: 5 mmol/L (ref 3.5–5.1)
SODIUM: 138 mmol/L (ref 135–145)
Total Protein: 7.9 g/dL (ref 6.0–8.3)

## 2014-10-29 LAB — URINALYSIS, ROUTINE W REFLEX MICROSCOPIC
BILIRUBIN URINE: NEGATIVE
Glucose, UA: NEGATIVE mg/dL
Ketones, ur: NEGATIVE mg/dL
NITRITE: NEGATIVE
PROTEIN: 30 mg/dL — AB
Specific Gravity, Urine: 1.024 (ref 1.005–1.030)
Urobilinogen, UA: 1 mg/dL (ref 0.0–1.0)
pH: 5 (ref 5.0–8.0)

## 2014-10-29 LAB — URINE MICROSCOPIC-ADD ON

## 2014-10-29 LAB — PREGNANCY, URINE: Preg Test, Ur: NEGATIVE

## 2014-10-29 LAB — LIPASE, BLOOD: Lipase: 16 U/L (ref 11–59)

## 2014-10-29 MED ORDER — AMOXICILLIN-POT CLAVULANATE 875-125 MG PO TABS: 1.0000 | ORAL_TABLET | Freq: Two times a day (BID) | ORAL | Status: DC

## 2014-10-29 MED ORDER — OXYCODONE-ACETAMINOPHEN 5-325 MG PO TABS
2.0000 | ORAL_TABLET | ORAL | Status: AC
  Administered 2014-10-29: 2 via ORAL
  Filled 2014-10-29: qty 2

## 2014-10-29 NOTE — ED Notes (Signed)
Pt unable to void 

## 2014-10-29 NOTE — Discharge Instructions (Signed)
Otitis Media Otitis media is redness, soreness, and inflammation of the middle ear. Otitis media may be caused by allergies or, most commonly, by infection. Often it occurs as a complication of the common cold. SIGNS AND SYMPTOMS Symptoms of otitis media may include:  Earache.  Fever.  Ringing in your ear.  Headache.  Leakage of fluid from the ear. DIAGNOSIS To diagnose otitis media, your health care provider will examine your ear with an otoscope. This is an instrument that allows your health care provider to see into your ear in order to examine your eardrum. Your health care provider also will ask you questions about your symptoms. TREATMENT  Typically, otitis media resolves on its own within 3-5 days. Your health care provider may prescribe medicine to ease your symptoms of pain. If otitis media does not resolve within 5 days or is recurrent, your health care provider may prescribe antibiotic medicines if he or she suspects that a bacterial infection is the cause. HOME CARE INSTRUCTIONS   If you were prescribed an antibiotic medicine, finish it all even if you start to feel better.  Take medicines only as directed by your health care provider.  Keep all follow-up visits as directed by your health care provider. SEEK MEDICAL CARE IF:  You have otitis media only in one ear, or bleeding from your nose, or both.  You notice a lump on your neck.  You are not getting better in 3-5 days.  You feel worse instead of better. SEEK IMMEDIATE MEDICAL CARE IF:   You have pain that is not controlled with medicine.  You have swelling, redness, or pain around your ear or stiffness in your neck.  You notice that part of your face is paralyzed.  You notice that the bone behind your ear (mastoid) is tender when you touch it. MAKE SURE YOU:   Understand these instructions.  Will watch your condition.  Will get help right away if you are not doing well or get worse. Document Released:  05/26/2004 Document Revised: 01/05/2014 Document Reviewed: 03/18/2013 [facility] Patient Information 2015 [place], [place]. This information is not intended to replace advice given to you by your health care provider. Make sure you discuss any questions you have with your health care provider. Urinary Tract Infection Urinary tract infections (UTIs) can develop anywhere along your urinary tract. Your urinary tract is your body's drainage system for removing wastes and extra water. Your urinary tract includes two kidneys, two ureters, a bladder, and a urethra. Your kidneys are a pair of bean-shaped organs. Each kidney is about the size of your fist. They are located below your ribs, one on each side of your spine. CAUSES Infections are caused by microbes, which are microscopic organisms, including fungi, viruses, and bacteria. These organisms are so small that they can only be seen through a microscope. Bacteria are the microbes that most commonly cause UTIs. SYMPTOMS  Symptoms of UTIs may vary by age and gender of the patient and by the location of the infection. Symptoms in young women typically include a frequent and intense urge to urinate and a painful, burning feeling in the bladder or urethra during urination. Older women and men are more likely to be tired, shaky, and weak and have muscle aches and abdominal pain. A fever may mean the infection is in your kidneys. Other symptoms of a kidney infection include pain in your back or sides below the ribs, nausea, and vomiting. DIAGNOSIS To diagnose a UTI, your caregiver will ask  you about your symptoms. Your caregiver also will ask to provide a urine sample. The urine sample will be tested for bacteria and white blood cells. White blood cells are made by your body to help fight infection. TREATMENT  Typically, UTIs can be treated with medication. Because most UTIs are caused by a bacterial infection, they usually can be treated with the use of antibiotics.  The choice of antibiotic and length of treatment depend on your symptoms and the type of bacteria causing your infection. HOME CARE INSTRUCTIONS  If you were prescribed antibiotics, take them exactly as your caregiver instructs you. Finish the medication even if you feel better after you have only taken some of the medication.  Drink enough water and fluids to keep your urine clear or pale yellow.  Avoid caffeine, tea, and carbonated beverages. They tend to irritate your bladder.  Empty your bladder often. Avoid holding urine for long periods of time.  Empty your bladder before and after sexual intercourse.  After a bowel movement, women should cleanse from front to back. Use each tissue only once. SEEK MEDICAL CARE IF:   You have back pain.  You develop a fever.  Your symptoms do not begin to resolve within 3 days. SEEK IMMEDIATE MEDICAL CARE IF:   You have severe back pain or lower abdominal pain.  You develop chills.  You have nausea or vomiting.  You have continued burning or discomfort with urination. MAKE SURE YOU:   Understand these instructions.  Will watch your condition.  Will get help right away if you are not doing well or get worse. Document Released: 05/31/2005 Document Revised: 02/20/2012 Document Reviewed: 09/29/2011 [facility] Patient Information 2015 [place], [place]. This information is not intended to replace advice given to you by your health care provider. Make sure you discuss any questions you have with your health care provider.

## 2014-10-29 NOTE — ED Provider Notes (Signed)
CSN: [id number]     Arrival date & time 10/29/14  1023 History   First MD Initiated Contact with Patient 10/29/14 1035     Chief Complaint  Patient presents with  . 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 It is cramping and aching in nature. She reports one episode of vomiting last night. She denies any diarrhea. She also reports that she's had nasal congestion and drainage, tearing and drainage from her eyes and right ear pain. She denies cough or shortness of breath. She has not had known fever. There is been no lower extremity swelling or pain. Past Medical History  Diagnosis Date  . Vision loss   . Renal disorder   . Anxiety   . Gastroparesis   . Narcotic abuse   . Non compliance with medical treatment    Past Surgical History  Procedure Laterality Date  . Refractive surgery    . Eye surgery    . Av fistula placement  11/17/2011    Procedure: ARTERIOVENOUS (AV) FISTULA CREATION;  Surgeon: Rosetta Posner, MD;  Location: [facility];  Service: Vascular;  Laterality: Right;  . Kidney transplant    . Pancrease transplant     Family History  Problem Relation Age of Onset  . Hypertension Father    History  Substance Use Topics  . Smoking status: Never Smoker   . Smokeless tobacco: Never Used     Comment: Hooka  . Alcohol Use: No   OB History    No data available     Review of Systems 10 Systems reviewed and are negative for acute change except as noted in the HPI.    Allergies  Fish-derived products and Shrimp  Home Medications   Prior to Admission medications   Medication Sig Start Date End Date Taking? Authorizing Provider  aspirin EC 81 MG tablet Take 81 mg by mouth every morning.   Yes Historical Provider, MD  cycloSPORINE modified (NEORAL) 100 MG capsule Take 200  mg by mouth See admin instructions. Takes with 25 mg capsule to equal 275 mg twice daily   Yes Historical Provider, MD  cycloSPORINE modified (NEORAL) 25 MG capsule Take 75 mg by mouth See admin instructions. Takes with 100 mg capsule to equal 275 mg twice daily   Yes Historical Provider, MD  mycophenolate (MYFORTIC) 180 MG EC tablet Take 540 mg by mouth 2 (two) times daily.   Yes Historical Provider, MD  acyclovir (ZOVIRAX) 400 MG tablet Take 2 tablets (800 mg total) by mouth 5 (five) times daily. Patient not taking: Reported on 10/29/2014 09/08/14   Jarrett Soho Muthersbaugh, PA-C  amoxicillin-clavulanate (AUGMENTIN) 875-125 MG per tablet Take 1 tablet by mouth 2 (two) times daily. One po bid x 7 days 10/29/14   Charlesetta Shanks, MD  promethazine (PHENERGAN) 25 MG suppository Place 1 suppository (25 mg total) rectally every 6 (six) hours as needed for nausea or vomiting. Patient not taking: Reported on 09/07/2014 02/22/14   Evelina Bucy, MD   BP 146/63 mmHg  Pulse 99  Temp(Src) 98.4 F (36.9 C) (Oral)  Resp 18  Ht 5\' 2"  (1.575 m)  Wt 149 lb (67.586 kg)  BMI 27.25 kg/m2  SpO2 99%  LMP 10/09/2014 Physical Exam  Constitutional: She is oriented to person, place, and time.  The patient is constantly tearful and complaining of pain. She is however well in appearance  with alert mental status, good color and no respiratory distress. She is sitting up in the bed.  HENT:  Head: Normocephalic and atraumatic.  Nose: Nose normal.  Mouth/Throat: Oropharynx is clear and moist. No oropharyngeal exudate.  The right TM has a purulent effusion present without gross erythema of the drum. The left TM is normal in appearance.  Eyes: EOM are normal. Pupils are equal, round, and reactive to light.  Neck: Neck supple.  Cardiovascular: Normal rate, regular rhythm, normal heart sounds and intact distal pulses.   Pulmonary/Chest: Effort normal and breath sounds normal. No respiratory distress. She has no wheezes. She has no  rales.  Abdominal: Soft. Bowel sounds are normal. She exhibits no distension and no mass. There is no rebound and no guarding.  The patient endorses tenderness to palpation throughout the entirety of her abdomen. The abdomen however is soft and there is no appreciable hepatosplenomegaly. There is no guarding present.  Musculoskeletal: Normal range of motion. She exhibits no edema or tenderness.  Lower extremities are in excellent condition with no peripheral edema. The feet are good condition with normal distal pulses. The patient's hands and forearms have small dark bruises that are suggestive of tract marks. There are no areas that appear secondarily infected. There are no palpable areas of nodular type abscess or erythema. The patient has an AV fistula in her right upper extremity that is pliable with a good thrill present. There is no erythema overlying this.  Neurological: She is alert and oriented to person, place, and time. No cranial nerve deficit. She exhibits normal muscle tone. Coordination normal.  Skin: Skin is warm and dry.  Psychiatric:  The patient is very alert however she is tearful.    ED Course  Procedures (including critical care time) Labs Review Labs Reviewed  COMPREHENSIVE METABOLIC PANEL - Abnormal; Notable for the following:    BUN 38 (*)    Creatinine, Ser 2.32 (*)    Alkaline Phosphatase 145 (*)    GFR calc non Af Amer 27 (*)    GFR calc Af Amer 31 (*)    All other components within normal limits  CBC WITH DIFFERENTIAL/PLATELET - Abnormal; Notable for the following:    Neutrophils Relative % 79 (*)    Lymphocytes Relative 10 (*)    All other components within normal limits  URINALYSIS, ROUTINE W REFLEX MICROSCOPIC - Abnormal; Notable for the following:    APPearance CLOUDY (*)    Hgb urine dipstick TRACE (*)    Protein, ur 30 (*)    Leukocytes, UA SMALL (*)    All other components within normal limits  URINE MICROSCOPIC-ADD ON - Abnormal; Notable for the  following:    Squamous Epithelial / LPF MANY (*)    Bacteria, UA MANY (*)    All other components within normal limits  URINE CULTURE  LIPASE, BLOOD  PREGNANCY, URINE    Imaging Review No results found.   EKG Interpretation None     Consult: The patient's case was reviewed with Dr. Billey Chang. This is the patient's nephrologist at [facility]. We reviewed her history present illness, physical examination and diagnostic results. Dr. Olena Heckle is very familiar with the patient and at this point time advised that she can be treated outpatient with a course of antibiotics, we agreed on Augmentin. The patient is already taking prophylactic Bactrim 3 days a week.  Recheck 15:11 the patient is resting and in no distress. She is counseled on the planned follow-up  with her nephrologist and the treatment plan. MDM   Final diagnoses:  Acute suppurative otitis media of right ear without spontaneous rupture of tympanic membrane, recurrence not specified  UTI (lower urinary tract infection)  Renal transplant, status post   Patient presents as outlined above with complex medical history. She will be managed as outlined.    Charlesetta Shanks, MD 10/29/14 [id number]

## 2014-10-29 NOTE — ED Notes (Signed)
MD at bedside. 

## 2014-10-29 NOTE — ED Notes (Signed)
RN notified MD of pt. Condition.

## 2014-10-29 NOTE — ED Notes (Signed)
Bed: WA09 Expected date:  Expected time:  Means of arrival:  Comments: EMS-abdominal pain for 1 week

## 2014-10-29 NOTE — ED Notes (Signed)
IV attempt x2.

## 2014-10-29 NOTE — ED Notes (Signed)
34 yo female with Abdomenal pain since yesterday also with Head/Ear/Nose congestion. Vomited x1. 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 Denies diarrhea. A/o x4   Vitals 140/80 88 HR 20 Resp

## 2014-12-18 DIAGNOSIS — N390 Urinary tract infection, site not specified: Secondary | ICD-10-CM | POA: Insufficient documentation

## 2014-12-18 DIAGNOSIS — B9620 Unspecified Escherichia coli [E. coli] as the cause of diseases classified elsewhere: Secondary | ICD-10-CM | POA: Insufficient documentation

## 2014-12-22 ENCOUNTER — Encounter (HOSPITAL_COMMUNITY): Payer: Self-pay

## 2014-12-22 ENCOUNTER — Emergency Department (HOSPITAL_COMMUNITY)
Admission: EM | Admit: 2014-12-22 | Discharge: 2014-12-22 | Disposition: A | Discharge status: Home / Self Care | Payer: Medicare Other | Attending: Emergency Medicine | Admitting: Emergency Medicine

## 2014-12-22 ENCOUNTER — Emergency Department (HOSPITAL_COMMUNITY): Payer: Medicare Other

## 2014-12-22 DIAGNOSIS — Z3202 Encounter for pregnancy test, result negative: Secondary | ICD-10-CM | POA: Insufficient documentation

## 2014-12-22 DIAGNOSIS — Z87448 Personal history of other diseases of urinary system: Secondary | ICD-10-CM | POA: Diagnosis not present

## 2014-12-22 DIAGNOSIS — Z7982 Long term (current) use of aspirin: Secondary | ICD-10-CM | POA: Diagnosis not present

## 2014-12-22 DIAGNOSIS — Z8719 Personal history of other diseases of the digestive system: Secondary | ICD-10-CM | POA: Diagnosis not present

## 2014-12-22 DIAGNOSIS — R1030 Lower abdominal pain, unspecified: Secondary | ICD-10-CM | POA: Diagnosis present

## 2014-12-22 DIAGNOSIS — D259 Leiomyoma of uterus, unspecified: Secondary | ICD-10-CM | POA: Diagnosis not present

## 2014-12-22 DIAGNOSIS — R102 Pelvic and perineal pain: Secondary | ICD-10-CM

## 2014-12-22 DIAGNOSIS — Z8659 Personal history of other mental and behavioral disorders: Secondary | ICD-10-CM | POA: Diagnosis not present

## 2014-12-22 DIAGNOSIS — Z79899 Other long term (current) drug therapy: Secondary | ICD-10-CM | POA: Insufficient documentation

## 2014-12-22 DIAGNOSIS — H547 Unspecified visual loss: Secondary | ICD-10-CM | POA: Insufficient documentation

## 2014-12-22 LAB — COMPREHENSIVE METABOLIC PANEL
ALK PHOS: 143 U/L — AB (ref 39–117)
ALT: 19 U/L (ref 0–35)
ANION GAP: 6 (ref 5–15)
AST: 19 U/L (ref 0–37)
Albumin: 4 g/dL (ref 3.5–5.2)
BILIRUBIN TOTAL: 0.5 mg/dL (ref 0.3–1.2)
BUN: 47 mg/dL — AB (ref 6–23)
CO2: 17 mmol/L — AB (ref 19–32)
Calcium: 9.1 mg/dL (ref 8.4–10.5)
Chloride: 114 mmol/L — ABNORMAL HIGH (ref 96–112)
Creatinine, Ser: 2.5 mg/dL — ABNORMAL HIGH (ref 0.50–1.10)
GFR, EST AFRICAN AMERICAN: 28 mL/min — AB (ref >90)
GFR, EST NON AFRICAN AMERICAN: 24 mL/min — AB (ref >90)
GLUCOSE: 119 mg/dL — AB (ref 70–99)
Potassium: 4.3 mmol/L (ref 3.5–5.1)
Sodium: 137 mmol/L (ref 135–145)
Total Protein: 7.6 g/dL (ref 6.0–8.3)

## 2014-12-22 LAB — CBC WITH DIFFERENTIAL/PLATELET
BASOS ABS: 0 10*3/uL (ref 0.0–0.1)
BASOS PCT: 0 % (ref 0–1)
EOS ABS: 0.1 10*3/uL (ref 0.0–0.7)
EOS PCT: 3 % (ref 0–5)
HEMATOCRIT: 37.9 % (ref 36.0–46.0)
Hemoglobin: 12.9 g/dL (ref 12.0–15.0)
Lymphocytes Relative: 21 % (ref 12–46)
Lymphs Abs: 1 10*3/uL (ref 0.7–4.0)
MCH: 29.6 pg (ref 26.0–34.0)
MCHC: 34 g/dL (ref 30.0–36.0)
MCV: 86.9 fL (ref 78.0–100.0)
MONO ABS: 0.4 10*3/uL (ref 0.1–1.0)
MONOS PCT: 9 % (ref 3–12)
Neutro Abs: 3.2 10*3/uL (ref 1.7–7.7)
Neutrophils Relative %: 67 % (ref 43–77)
Platelets: 133 10*3/uL — ABNORMAL LOW (ref 150–400)
RBC: 4.36 MIL/uL (ref 3.87–5.11)
RDW: 13.3 % (ref 11.5–15.5)
WBC: 4.7 10*3/uL (ref 4.0–10.5)

## 2014-12-22 LAB — URINALYSIS, ROUTINE W REFLEX MICROSCOPIC
Bilirubin Urine: NEGATIVE
GLUCOSE, UA: NEGATIVE mg/dL
Hgb urine dipstick: NEGATIVE
KETONES UR: NEGATIVE mg/dL
Nitrite: NEGATIVE
Protein, ur: NEGATIVE mg/dL
Specific Gravity, Urine: 1.02 (ref 1.005–1.030)
Urobilinogen, UA: 0.2 mg/dL (ref 0.0–1.0)
pH: 5 (ref 5.0–8.0)

## 2014-12-22 LAB — URINE MICROSCOPIC-ADD ON

## 2014-12-22 LAB — POC URINE PREG, ED: Preg Test, Ur: NEGATIVE

## 2014-12-22 IMAGING — US US PELVIS COMPLETE
1 series · 13 of 25 positions shown · non-contrast
Comparison: CT of the abdomen and pelvis [DATE]

CLINICAL DATA: LEFT-sided pelvic pain for several weeks. History of
hypertension, diabetes, chronic kidney disease. History of
kidney/pancreas transplant.

EXAM:
TRANSABDOMINAL AND TRANSVAGINAL ULTRASOUND OF PELVIS
TECHNIQUE: Both transabdominal and transvaginal ultrasound examinations of the
pelvis were performed. Transabdominal technique was performed for
global imaging of the pelvis including uterus, ovaries, adnexal
regions, and pelvic cul-de-sac. It was necessary to proceed with
endovaginal exam following the transabdominal exam to visualize the
ovaries and endometrium.

[Series 1: us pelvis complete · 0.21mm/px · 13 of 61 slices shown]
[im 1/61]
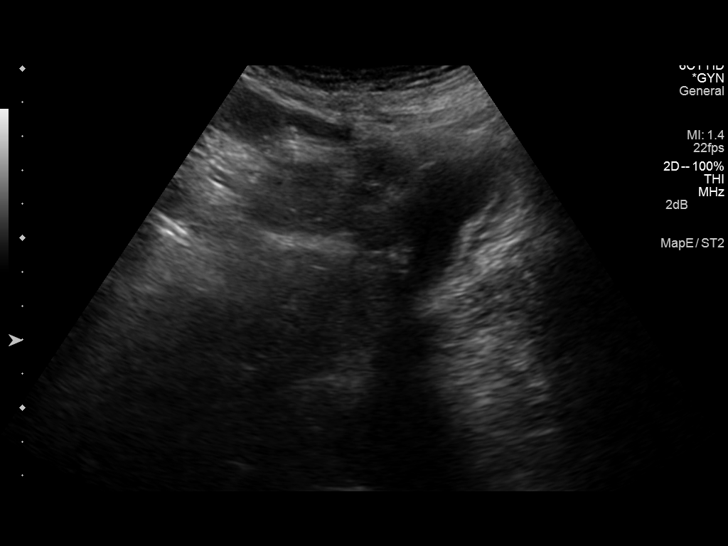
[im 6/61]
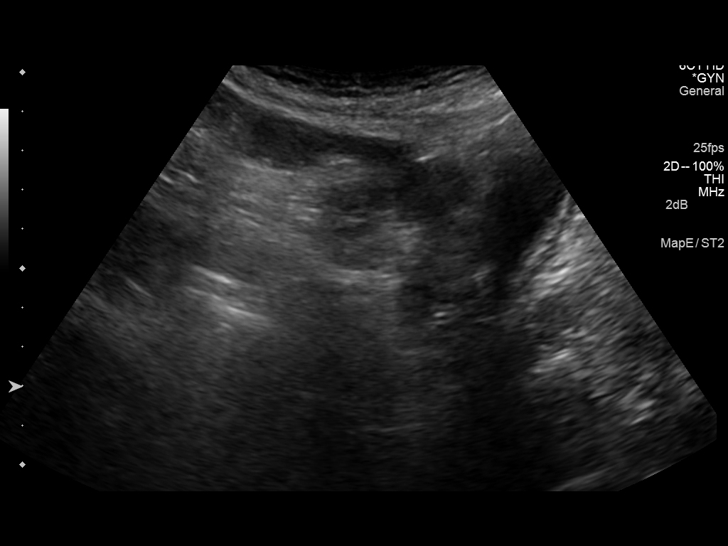
[im 11/61]
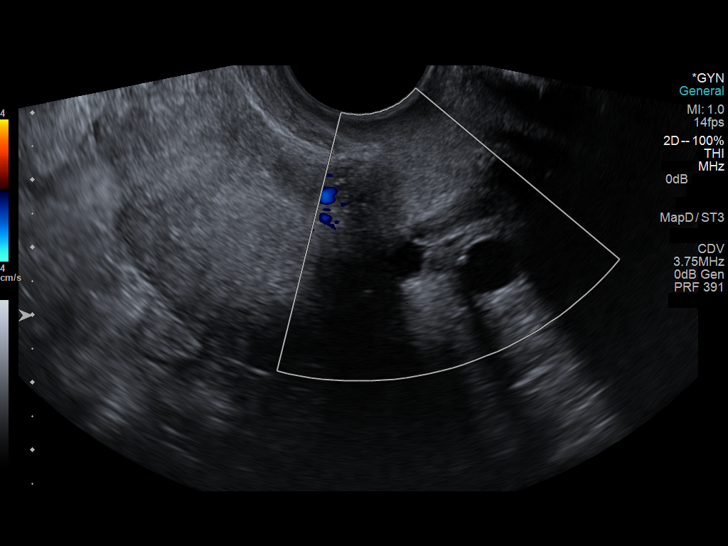
[im 16/61]
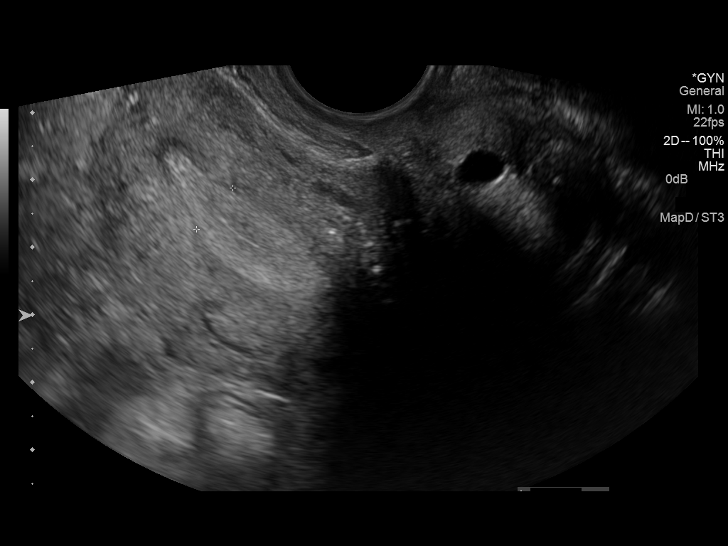
[im 21/61]
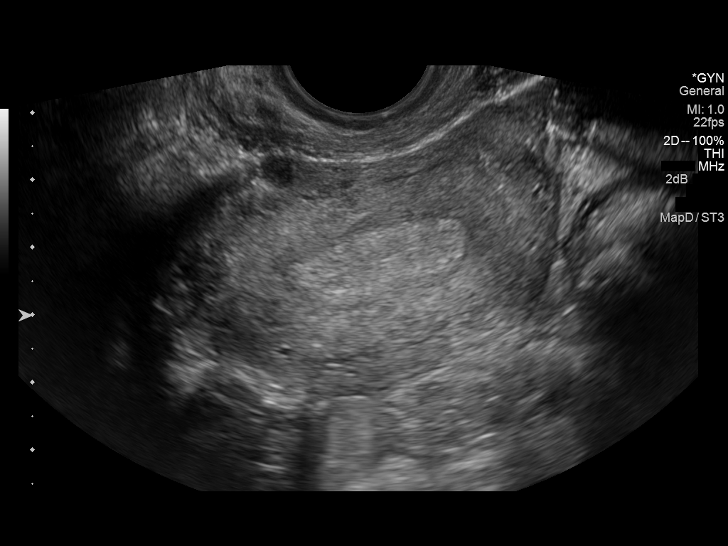
[im 26/61]
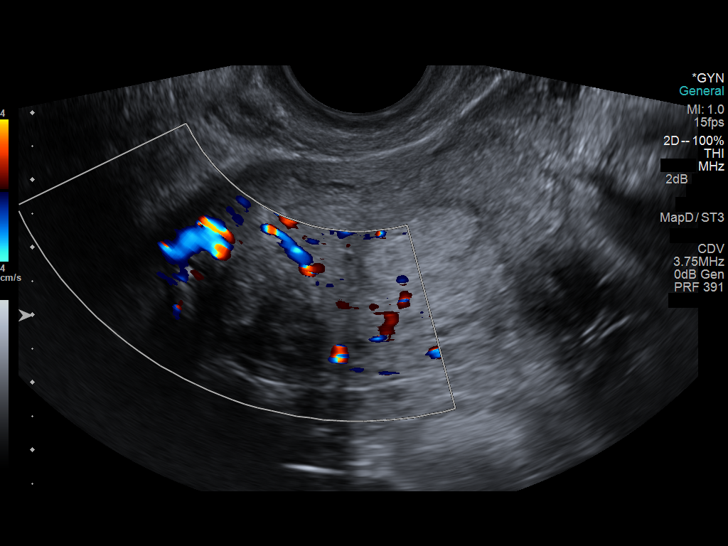
[im 31/61]
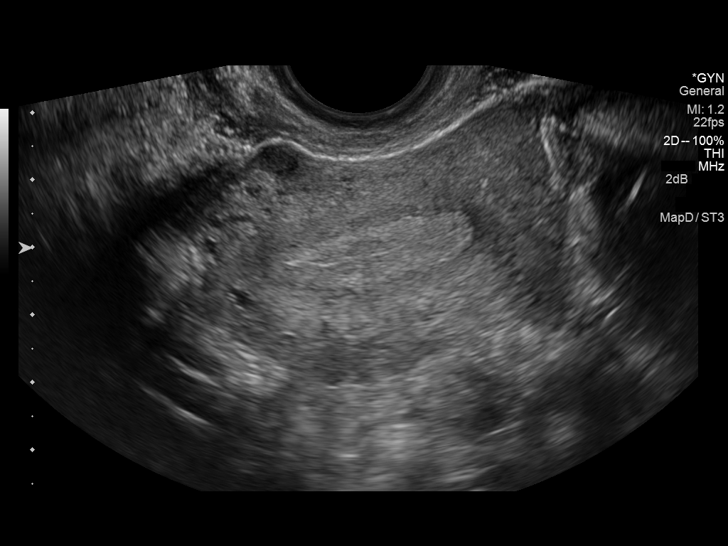
[im 36/61]
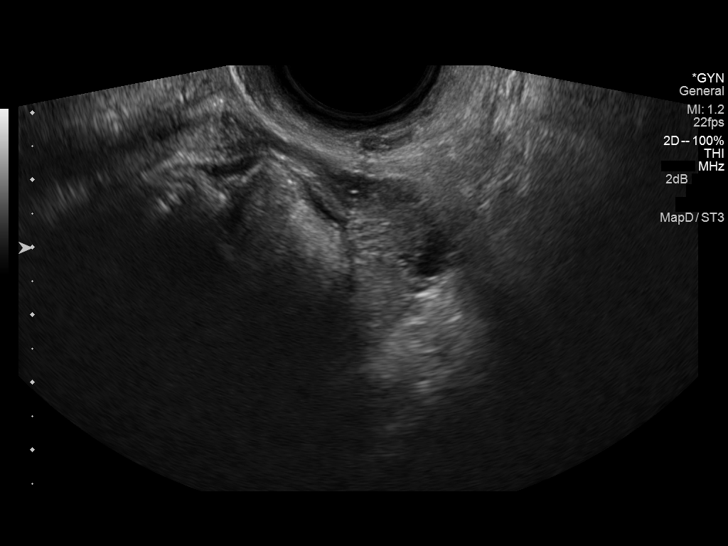
[im 41/61]
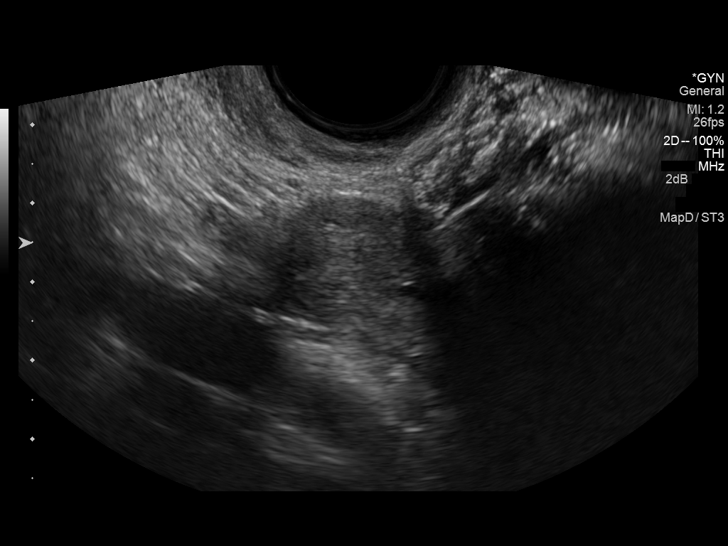
[im 46/61]
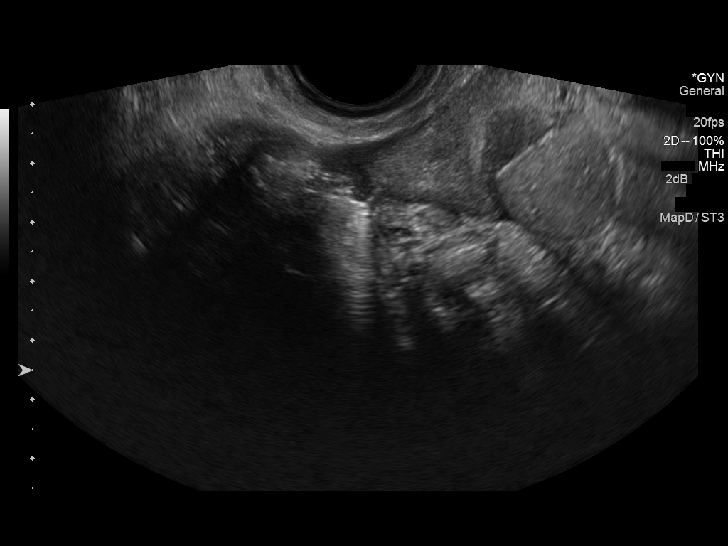
[im 51/61]
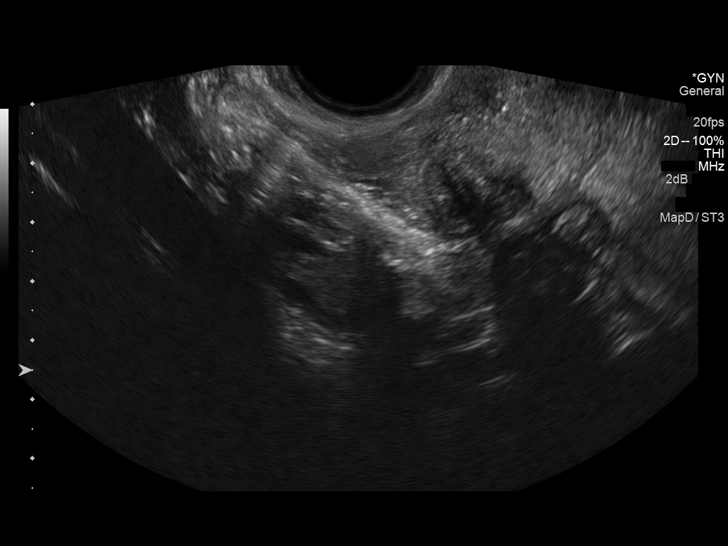
[im 56/61]
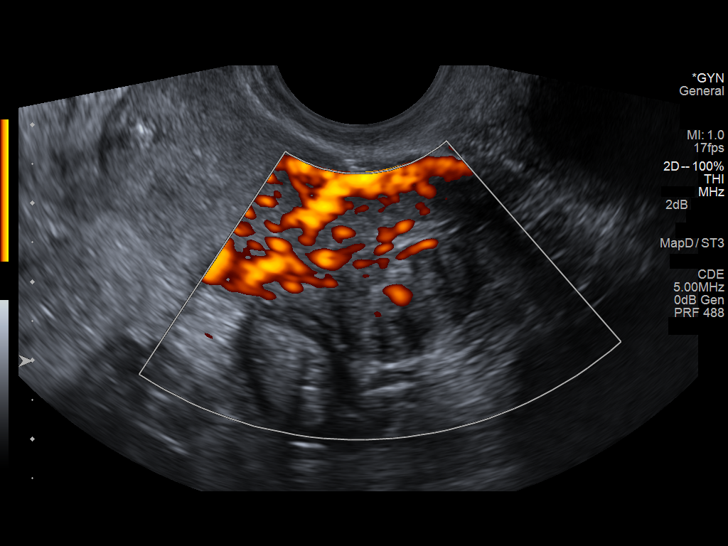
[im 61/61]
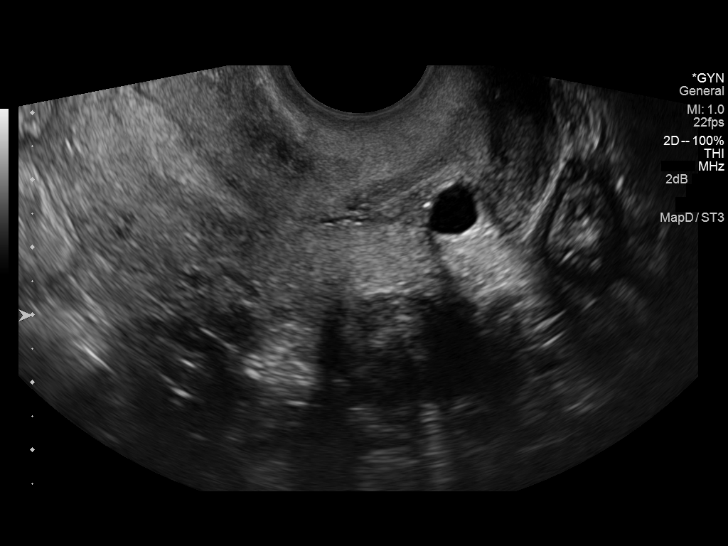

[13 of 25 positions shown; findings below may reference images not displayed]

FINDINGS: Uterus

Measurements: 7.4 x 2.8 x 5.8 cm. Multiple fibroids evident,
including 2.6 x 1.6 x 2.2 cm RIGHT uterine body intramural leiomyoma
and 2.2 x 1.5 x 1.5 cm intramural/sub serosal RIGHT lower uterine
segment leiomyoma. Tiny fundal leiomyoma. Nabothian cysts at the
level of the cervix.

Endometrium

Thickness: 8.1 mm.  No focal abnormality visualized.

Right ovary

Measurements: 2.7 x 2 x 2.7 cm. Normal appearance/no adnexal mass.

Left ovary

Measurements: 2.9 x 1.8 x 1.7 cm. Normal appearance/no adnexal mass.

Other findings

No free fluid.
IMPRESSION: Multiple uterine fibroids, including 2.2 cm RIGHT lower uterine
segment intramural/subserosal leiomyoma without acute pelvic
process.

By: [email or web address]

## 2014-12-22 MED ORDER — OXYCODONE-ACETAMINOPHEN 5-325 MG PO TABS: 1.0000 | ORAL_TABLET | ORAL | Status: DC | PRN

## 2014-12-22 MED ORDER — SODIUM CHLORIDE 0.9 % IV BOLUS (SEPSIS)
1000.0000 mL | Freq: Once | INTRAVENOUS | Status: AC
  Administered 2014-12-22: 1000 mL via INTRAVENOUS

## 2014-12-22 MED ORDER — IBUPROFEN 800 MG PO TABS: 800.0000 mg | ORAL_TABLET | Freq: Three times a day (TID) | ORAL | Status: DC

## 2014-12-22 MED ORDER — ONDANSETRON HCL 4 MG/2ML IJ SOLN
4.0000 mg | Freq: Once | INTRAMUSCULAR | Status: AC
  Administered 2014-12-22: 4 mg via INTRAVENOUS
  Filled 2014-12-22: qty 2

## 2014-12-22 MED ORDER — HYDROMORPHONE HCL 1 MG/ML IJ SOLN
1.0000 mg | Freq: Once | INTRAMUSCULAR | Status: AC
  Administered 2014-12-22: 1 mg via INTRAVENOUS
  Filled 2014-12-22: qty 1

## 2014-12-22 MED ORDER — ONDANSETRON 8 MG PO TBDP
8.0000 mg | ORAL_TABLET | Freq: Once | ORAL | Status: AC
  Administered 2014-12-22: 8 mg via ORAL
  Filled 2014-12-22: qty 1

## 2014-12-22 NOTE — ED Provider Notes (Signed)
CSN: [id number]     Arrival date & time 12/22/14  2007 History   First MD Initiated Contact with Patient 12/22/14 2107     Chief Complaint  Patient presents with  . 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 She states that she was at [facility] in the last 3 weeks where she was admitted for urinary tract infection, acute renal failure and an ovarian cyst. She reports that she required high doses of opiate medications including Dilaudid every 2 hours, oxycodone for breakthrough pain and antibiotics. She reports being discharged home on Keflex and oxycodone, she had some improvement however in the last several days she has had recurrent urinary symptoms including burning with urination and lower abdominal discomfort which is constant. She has been vomiting the majority of the day, states she is unable to hold down food or fluids, denies any lower back pain, states she has been febrile though this is a subjective feeling.   Patient is a 34 y.o. female presenting with abdominal pain. The history is provided by the patient.  Abdominal Pain   Past Medical History  Diagnosis Date  . Vision loss   . Renal disorder   . Anxiety   . Gastroparesis   . Narcotic abuse   . Non compliance with medical treatment    Past Surgical History  Procedure Laterality Date  . Refractive surgery    . Eye surgery    . Av fistula placement  11/17/2011    Procedure: ARTERIOVENOUS (AV) FISTULA CREATION;  Surgeon: Rosetta Posner, MD;  Location: [place];  Service: Vascular;  Laterality: Right;  . Kidney transplant    . Pancrease transplant     Family History  Problem Relation Age of Onset  . Hypertension Father    History  Substance Use Topics  . Smoking  status: Never Smoker   . Smokeless tobacco: Never Used     Comment: Hooka  . Alcohol Use: No   OB History    No data available     Review of Systems  Gastrointestinal: Positive for abdominal pain.  All other systems reviewed and are negative.     Allergies  Fish-derived products and Shrimp  Home Medications   Prior to Admission medications   Medication Sig Start Date End Date Taking? Authorizing Provider  acetaminophen (TYLENOL) 325 MG tablet Take 650 mg by mouth every 6 (six) hours as needed for moderate pain.   Yes Historical Provider, MD  aspirin EC 81 MG tablet Take 81 mg by mouth every morning.   Yes Historical Provider, MD  cycloSPORINE modified (NEORAL) 100 MG capsule Take 200 mg by mouth See admin instructions. Takes with 25 mg capsule to equal 275 mg twice daily   Yes Historical Provider, MD  cycloSPORINE modified (NEORAL) 25 MG capsule Take 75 mg by mouth See admin instructions. Takes with 100 mg capsule to equal 275 mg twice daily   Yes Historical Provider, MD  mycophenolate (MYFORTIC) 180 MG EC tablet Take 540 mg by mouth 2 (two) times daily.   Yes Historical Provider, MD  sodium bicarbonate 650 MG tablet Take 1,300 mg by mouth 2 (two) times daily.   Yes Historical Provider, MD  acyclovir (ZOVIRAX) 400 MG tablet Take 2 tablets (800 mg total) by mouth 5 (five) times daily. Patient not  taking: Reported on 10/29/2014 09/08/14   Jarrett Soho Muthersbaugh, PA-C  amoxicillin-clavulanate (AUGMENTIN) 875-125 MG per tablet Take 1 tablet by mouth 2 (two) times daily. One po bid x 7 days Patient not taking: Reported on 12/22/2014 10/29/14   Charlesetta Shanks, MD  ibuprofen (ADVIL,MOTRIN) 800 MG tablet Take 1 tablet (800 mg total) by mouth 3 (three) times daily. 12/22/14   Noemi Chapel, MD  oxyCODONE-acetaminophen (PERCOCET) 5-325 MG per tablet Take 1 tablet by mouth every 4 (four) hours as needed. 12/22/14   Noemi Chapel, MD  promethazine (PHENERGAN) 25 MG suppository Place 1 suppository (25 mg  total) rectally every 6 (six) hours as needed for nausea or vomiting. Patient not taking: Reported on 09/07/2014 02/22/14   Evelina Bucy, MD   BP 141/84 mmHg  Pulse 84  Temp(Src) 98.2 F (36.8 C) (Oral)  Resp 18  SpO2 100%  LMP 11/23/2014 (Approximate) Physical Exam  Constitutional: She appears well-developed and well-nourished. No distress.  HENT:  Head: Normocephalic and atraumatic.  Mouth/Throat: Oropharynx is clear and moist. No oropharyngeal exudate.  Eyes: Conjunctivae and EOM are normal. Pupils are equal, round, and reactive to light. Right eye exhibits no discharge. Left eye exhibits no discharge. No scleral icterus.  Neck: Normal range of motion. Neck supple. No JVD present. No thyromegaly present.  Cardiovascular: Normal rate, regular rhythm, normal heart sounds and intact distal pulses.  Exam reveals no gallop and no friction rub.   No murmur heard. Pulmonary/Chest: Effort normal and breath sounds normal. No respiratory distress. She has no wheezes. She has no rales.  Abdominal: Soft. Bowel sounds are normal. She exhibits no distension and no mass. There is tenderness ( Tender to palpation in the left lower quadrant, suprapubic and less so on the right lower quadrant, no upper abdominal tenderness).  Musculoskeletal: Normal range of motion. She exhibits no edema or tenderness.  Lymphadenopathy:    She has no cervical adenopathy.  Neurological: She is alert. Coordination normal.  Skin: Skin is warm and dry. No rash noted. No erythema.  Psychiatric: She has a normal mood and affect. Her behavior is normal.  Nursing note and vitals reviewed.   ED Course  Procedures (including critical care time) Labs Review Labs Reviewed  CBC WITH DIFFERENTIAL/PLATELET - Abnormal; Notable for the following:    Platelets 133 (*)    All other components within normal limits  COMPREHENSIVE METABOLIC PANEL - Abnormal; Notable for the following:    Chloride 114 (*)    CO2 17 (*)    Glucose,  Bld 119 (*)    BUN 47 (*)    Creatinine, Ser 2.50 (*)    Alkaline Phosphatase 143 (*)    GFR calc non Af Amer 24 (*)    GFR calc Af Amer 28 (*)    All other components within normal limits  URINALYSIS, ROUTINE W REFLEX MICROSCOPIC - Abnormal; Notable for the following:    APPearance CLOUDY (*)    Leukocytes, UA SMALL (*)    All other components within normal limits  URINE MICROSCOPIC-ADD ON - Abnormal; Notable for the following:    Squamous Epithelial / LPF MANY (*)    Bacteria, UA FEW (*)    All other components within normal limits  POC URINE PREG, ED    Imaging Review US Transvaginal Non-ob  12/22/2014   CLINICAL DATA:  LEFT-sided pelvic pain for several weeks. History of hypertension, diabetes, chronic kidney disease. History of kidney/pancreas transplant.  EXAM: TRANSABDOMINAL AND TRANSVAGINAL ULTRASOUND OF PELVIS  TECHNIQUE:  Both transabdominal and transvaginal ultrasound examinations of the pelvis were performed. Transabdominal technique was performed for global imaging of the pelvis including uterus, ovaries, adnexal regions, and pelvic cul-de-sac. It was necessary to proceed with endovaginal exam following the transabdominal exam to visualize the ovaries and endometrium.  COMPARISON:  CT of the abdomen and pelvis Jan 26, 2014  FINDINGS: Uterus  Measurements: 7.4 x 2.8 x 5.8 cm. Multiple fibroids evident, including 2.6 x 1.6 x 2.2 cm RIGHT uterine body intramural leiomyoma and 2.2 x 1.5 x 1.5 cm intramural/sub serosal RIGHT lower uterine segment leiomyoma. Tiny fundal leiomyoma. Nabothian cysts at the level of the cervix.  Endometrium  Thickness: 8.1 mm.  No focal abnormality visualized.  Right ovary  Measurements: 2.7 x 2 x 2.7 cm. Normal appearance/no adnexal mass.  Left ovary  Measurements: 2.9 x 1.8 x 1.7 cm. Normal appearance/no adnexal mass.  Other findings  No free fluid.  IMPRESSION: Multiple uterine fibroids, including 2.2 cm RIGHT lower uterine segment intramural/subserosal  leiomyoma without acute pelvic process.   Electronically Signed   By: Elon Alas   On: 12/22/2014 23:01   US Pelvis Complete  12/22/2014   CLINICAL DATA:  LEFT-sided pelvic pain for several weeks. History of hypertension, diabetes, chronic kidney disease. History of kidney/pancreas transplant.  EXAM: TRANSABDOMINAL AND TRANSVAGINAL ULTRASOUND OF PELVIS  TECHNIQUE: Both transabdominal and transvaginal ultrasound examinations of the pelvis were performed. Transabdominal technique was performed for global imaging of the pelvis including uterus, ovaries, adnexal regions, and pelvic cul-de-sac. It was necessary to proceed with endovaginal exam following the transabdominal exam to visualize the ovaries and endometrium.  COMPARISON:  CT of the abdomen and pelvis Jan 26, 2014  FINDINGS: Uterus  Measurements: 7.4 x 2.8 x 5.8 cm. Multiple fibroids evident, including 2.6 x 1.6 x 2.2 cm RIGHT uterine body intramural leiomyoma and 2.2 x 1.5 x 1.5 cm intramural/sub serosal RIGHT lower uterine segment leiomyoma. Tiny fundal leiomyoma. Nabothian cysts at the level of the cervix.  Endometrium  Thickness: 8.1 mm.  No focal abnormality visualized.  Right ovary  Measurements: 2.7 x 2 x 2.7 cm. Normal appearance/no adnexal mass.  Left ovary  Measurements: 2.9 x 1.8 x 1.7 cm. Normal appearance/no adnexal mass.  Other findings  No free fluid.  IMPRESSION: Multiple uterine fibroids, including 2.2 cm RIGHT lower uterine segment intramural/subserosal leiomyoma without acute pelvic process.   Electronically Signed   By: Elon Alas   On: 12/22/2014 23:01    MDM   Final diagnoses:  Pelvic pain in female  Uterine leiomyoma, unspecified location    Laboratory workup shows normal blood counts, creatinine of 2.5, BUN of 47 suggesting some dehydration, urinalysis with a cloudy appearance but no signs of infection with only few bacteria and 7 white blood cells. No ketones, no glucose, no leukocytosis. IV rehydration,  fluids, culture the urine, the patient does not appear toxic  Filed Vitals:   12/22/14 2008  BP: 127/71  Pulse: 91  Temp: 98.2 F (36.8 C)  Resp: 18   Ultrasound shows uterine fibroids, urinalysis without findings of acute infection, renal function with creatinine of 2.5 which is what she reports from her prior admission to the hospital 3 weeks ago. She was seen earlier today at her doctor's office she now reports, had blood work done there, I will give her a copy of her blood work to take with her, she has a follow-up with the gynecologist in one week, I have explained all of her results to  her. She does not have a leukocytosis of fever or tachycardia or any other concerning findings. She has had improvement with pain medications, stable for discharge.  Meds given in ED:  Medications  ondansetron (ZOFRAN) injection 4 mg (not administered)  ondansetron (ZOFRAN-ODT) disintegrating tablet 8 mg (8 mg Oral Given 12/22/14 2015)  sodium chloride 0.9 % bolus 1,000 mL (0 mLs Intravenous Stopped 12/22/14 2237)  HYDROmorphone (DILAUDID) injection 1 mg (1 mg Intravenous Given 12/22/14 2149)  ondansetron (ZOFRAN) injection 4 mg (4 mg Intravenous Given 12/22/14 2149)    New Prescriptions   IBUPROFEN (ADVIL,MOTRIN) 800 MG TABLET    Take 1 tablet (800 mg total) by mouth 3 (three) times daily.   OXYCODONE-ACETAMINOPHEN (PERCOCET) 5-325 MG PER TABLET    Take 1 tablet by mouth every 4 (four) hours as needed.        Noemi Chapel, MD 12/22/14 [phone]

## 2014-12-22 NOTE — Discharge Instructions (Signed)
Please call your doctor for a followup appointment within 24-48 hours. When you talk to your doctor please let them know that you were seen in the emergency department and have them acquire all of your records so that they can discuss the findings with you and formulate a treatment plan to fully care for your new and ongoing problems.  You have been diagnosed with undifferentiated abdominal pain.  Abdominal pain can be caused by many things. Your caregiver evaluates the seriousness of your pain by an examination and possibly blood or urine tests and imaging (CT scan, x-rays, ultrasound). Many cases can be observed and treated at home after initial evaluation in the emergency department. Even though you are being discharged home, abdominal pain can be unpredictable. Therefore, you need a repeat exam if your pain does not resolve, returns, or worsens. Most patient's with abdominal pain do not need to be admitted to the hospital or have surgery, but serious problems like appendicitis and gallbladder attacks can start out as nonspecific pain. Many abdominal conditions cannot be diagnosed in 1 visit, so followup evaluations are very important.  Seek immediate medical attention if:  *The pain does not go away or becomes severe. *Temperature above 101 develops *Repeated vomiting occurs(multiple episodes) *The pain becomes localized to portions of the abdomen. The right side could possibly be appendicitis. In an adult, the left lower portion of the abdomen could be colitis or diverticulitis. *Blood is being passed in stools or vomit *Return also if you develop chest pain, difficulty breathing, dizziness or fainting, or become confused poorly responsive or inconsolable (young children).

## 2014-12-22 NOTE — ED Notes (Signed)
Bed: [facility]2 Expected date:  Expected time:  Means of arrival:  Comments: EMS 34 yo female  Lower abdominal pain-hx UTI diagnosed 3 weeks ago-UTI symptoms

## 2014-12-22 NOTE — ED Notes (Signed)
Pt states having burning w/ urination and LLQ abdominal pain that has worsened since diagnosed w/ UTI and finished antibiotic.

## 2014-12-22 NOTE — ED Notes (Addendum)
Pt presents from home via EMS with c/o abdominal pain. Pt reports her abdominal pain is in her lower left quadrant. Pt was seen at [facility] approx 3 weeks ago and diagnosed with UTI and ovarian cyst. Pt reports she finished her medications approx 2 days ago, symptoms are not any better. Pt reports nausea. Pt is also a pancreatic and kidney transplant patient.

## 2015-01-09 ENCOUNTER — Encounter (HOSPITAL_COMMUNITY): Payer: Self-pay | Admitting: Emergency Medicine

## 2015-01-09 DIAGNOSIS — R1012 Left upper quadrant pain: Secondary | ICD-10-CM | POA: Insufficient documentation

## 2015-01-09 NOTE — ED Notes (Signed)
Pt from home via [facility] c/o upper left quadrant  Pain. HX of pancreas and kidney transplant.

## 2015-01-10 ENCOUNTER — Emergency Department (HOSPITAL_COMMUNITY)
Admission: EM | Admit: 2015-01-10 | Discharge: 2015-01-10 | Discharge status: Against Medical Advice | Payer: Medicare Other | Attending: Emergency Medicine | Admitting: Emergency Medicine

## 2015-01-10 NOTE — ED Notes (Signed)
Pt not in lobby.  

## 2015-01-10 NOTE — ED Notes (Signed)
Called pt X3 for blood draw no response from lobby, RN made aware.

## 2015-01-11 ENCOUNTER — Encounter (HOSPITAL_COMMUNITY): Payer: Self-pay | Admitting: *Deleted

## 2015-01-11 ENCOUNTER — Emergency Department (HOSPITAL_COMMUNITY)
Admission: EM | Admit: 2015-01-11 | Discharge: 2015-01-12 | Discharge status: Against Medical Advice | Payer: Medicare Other | Attending: Emergency Medicine | Admitting: Emergency Medicine

## 2015-01-11 DIAGNOSIS — R109 Unspecified abdominal pain: Secondary | ICD-10-CM | POA: Insufficient documentation

## 2015-01-11 DIAGNOSIS — R112 Nausea with vomiting, unspecified: Secondary | ICD-10-CM | POA: Insufficient documentation

## 2015-01-11 NOTE — ED Notes (Signed)
Pt states that she has had abdominal pain x 2 days;  Pt c/o N/V and loose stools; pt denies diarrhea but states that her stool is soft and mushy; pt states that she was treated for an infection to her abd approx a month ago; pt states that she has had a kidney and pancreas transplant

## 2015-01-12 ENCOUNTER — Encounter (HOSPITAL_COMMUNITY): Payer: Self-pay

## 2015-01-12 ENCOUNTER — Emergency Department (HOSPITAL_COMMUNITY)
Admission: EM | Admit: 2015-01-12 | Discharge: 2015-01-13 | Discharge status: Against Medical Advice | Payer: Medicare Other | Attending: Emergency Medicine | Admitting: Emergency Medicine

## 2015-01-12 DIAGNOSIS — Z9889 Other specified postprocedural states: Secondary | ICD-10-CM | POA: Diagnosis not present

## 2015-01-12 DIAGNOSIS — Z87448 Personal history of other diseases of urinary system: Secondary | ICD-10-CM | POA: Diagnosis not present

## 2015-01-12 DIAGNOSIS — Z940 Kidney transplant status: Secondary | ICD-10-CM | POA: Insufficient documentation

## 2015-01-12 DIAGNOSIS — R112 Nausea with vomiting, unspecified: Secondary | ICD-10-CM | POA: Insufficient documentation

## 2015-01-12 DIAGNOSIS — R197 Diarrhea, unspecified: Secondary | ICD-10-CM | POA: Insufficient documentation

## 2015-01-12 DIAGNOSIS — Z3202 Encounter for pregnancy test, result negative: Secondary | ICD-10-CM | POA: Diagnosis not present

## 2015-01-12 DIAGNOSIS — Z7982 Long term (current) use of aspirin: Secondary | ICD-10-CM | POA: Diagnosis not present

## 2015-01-12 DIAGNOSIS — Z8719 Personal history of other diseases of the digestive system: Secondary | ICD-10-CM | POA: Insufficient documentation

## 2015-01-12 DIAGNOSIS — Z79899 Other long term (current) drug therapy: Secondary | ICD-10-CM | POA: Diagnosis not present

## 2015-01-12 DIAGNOSIS — R109 Unspecified abdominal pain: Secondary | ICD-10-CM | POA: Diagnosis not present

## 2015-01-12 DIAGNOSIS — R1012 Left upper quadrant pain: Secondary | ICD-10-CM | POA: Diagnosis not present

## 2015-01-12 DIAGNOSIS — R630 Anorexia: Secondary | ICD-10-CM | POA: Insufficient documentation

## 2015-01-12 DIAGNOSIS — H547 Unspecified visual loss: Secondary | ICD-10-CM | POA: Diagnosis not present

## 2015-01-12 DIAGNOSIS — Z8659 Personal history of other mental and behavioral disorders: Secondary | ICD-10-CM | POA: Diagnosis not present

## 2015-01-12 DIAGNOSIS — Z9119 Patient's noncompliance with other medical treatment and regimen: Secondary | ICD-10-CM | POA: Diagnosis not present

## 2015-01-12 LAB — CBC WITH DIFFERENTIAL/PLATELET
BASOS ABS: 0 10*3/uL (ref 0.0–0.1)
Basophils Relative: 0 % (ref 0–1)
Eosinophils Absolute: 0.1 10*3/uL (ref 0.0–0.7)
Eosinophils Relative: 2 % (ref 0–5)
HEMATOCRIT: 40.3 % (ref 36.0–46.0)
Hemoglobin: 13.2 g/dL (ref 12.0–15.0)
Lymphocytes Relative: 19 % (ref 12–46)
Lymphs Abs: 0.9 10*3/uL (ref 0.7–4.0)
MCH: 28.9 pg (ref 26.0–34.0)
MCHC: 32.8 g/dL (ref 30.0–36.0)
MCV: 88.2 fL (ref 78.0–100.0)
MONO ABS: 0.4 10*3/uL (ref 0.1–1.0)
Monocytes Relative: 9 % (ref 3–12)
NEUTROS ABS: 3.4 10*3/uL (ref 1.7–7.7)
NEUTROS PCT: 70 % (ref 43–77)
PLATELETS: 151 10*3/uL (ref 150–400)
RBC: 4.57 MIL/uL (ref 3.87–5.11)
RDW: 13.5 % (ref 11.5–15.5)
WBC: 4.8 10*3/uL (ref 4.0–10.5)

## 2015-01-12 LAB — COMPREHENSIVE METABOLIC PANEL
ALT: 24 U/L (ref 14–54)
AST: 17 U/L (ref 15–41)
Albumin: 3.9 g/dL (ref 3.5–5.0)
Alkaline Phosphatase: 153 U/L — ABNORMAL HIGH (ref 38–126)
Anion gap: 6 (ref 5–15)
BUN: 43 mg/dL — ABNORMAL HIGH (ref 6–20)
CALCIUM: 9.3 mg/dL (ref 8.9–10.3)
CO2: 20 mmol/L — ABNORMAL LOW (ref 22–32)
CREATININE: 2.55 mg/dL — AB (ref 0.44–1.00)
Chloride: 114 mmol/L — ABNORMAL HIGH (ref 101–111)
GFR, EST AFRICAN AMERICAN: 27 mL/min — AB (ref >60)
GFR, EST NON AFRICAN AMERICAN: 24 mL/min — AB (ref >60)
GLUCOSE: 75 mg/dL (ref 70–99)
Potassium: 5 mmol/L (ref 3.5–5.1)
SODIUM: 140 mmol/L (ref 135–145)
Total Bilirubin: 0.8 mg/dL (ref 0.3–1.2)
Total Protein: 7.7 g/dL (ref 6.5–8.1)

## 2015-01-12 LAB — LIPASE, BLOOD: Lipase: 21 U/L — ABNORMAL LOW (ref 22–51)

## 2015-01-12 NOTE — ED Notes (Signed)
Pt complains of upper left quadrant pain after eating dinner tonight, pt is a kidney transplant patient from November 2014

## 2015-01-12 NOTE — ED Notes (Signed)
Pt sts she had a family emergency so she was not able to wait to be seen by an EDP.

## 2015-01-13 ENCOUNTER — Emergency Department (HOSPITAL_COMMUNITY): Payer: Medicare Other

## 2015-01-13 DIAGNOSIS — R1012 Left upper quadrant pain: Secondary | ICD-10-CM | POA: Diagnosis not present

## 2015-01-13 LAB — CBC WITH DIFFERENTIAL/PLATELET
BASOS PCT: 0 % (ref 0–1)
Basophils Absolute: 0 10*3/uL (ref 0.0–0.1)
EOS ABS: 0.1 10*3/uL (ref 0.0–0.7)
Eosinophils Relative: 2 % (ref 0–5)
HEMATOCRIT: 39.1 % (ref 36.0–46.0)
HEMOGLOBIN: 13 g/dL (ref 12.0–15.0)
Lymphocytes Relative: 20 % (ref 12–46)
Lymphs Abs: 0.9 10*3/uL (ref 0.7–4.0)
MCH: 29.1 pg (ref 26.0–34.0)
MCHC: 33.2 g/dL (ref 30.0–36.0)
MCV: 87.7 fL (ref 78.0–100.0)
Monocytes Absolute: 0.4 10*3/uL (ref 0.1–1.0)
Monocytes Relative: 8 % (ref 3–12)
NEUTROS ABS: 3 10*3/uL (ref 1.7–7.7)
NEUTROS PCT: 70 % (ref 43–77)
PLATELETS: 140 10*3/uL — AB (ref 150–400)
RBC: 4.46 MIL/uL (ref 3.87–5.11)
RDW: 13.5 % (ref 11.5–15.5)
WBC: 4.3 10*3/uL (ref 4.0–10.5)

## 2015-01-13 LAB — BLOOD GAS, VENOUS
ACID-BASE DEFICIT: 5.2 mmol/L — AB (ref 0.0–2.0)
BICARBONATE: 20.5 meq/L (ref 20.0–24.0)
FIO2: 0.21 %
O2 Saturation: 52.2 %
PCO2 VEN: 41.9 mmHg — AB (ref 45.0–50.0)
PH VEN: 7.308 — AB (ref 7.250–7.300)
PO2 VEN: 30.5 mmHg (ref 30.0–45.0)
Patient temperature: 98
TCO2: 19 mmol/L (ref 0–100)

## 2015-01-13 LAB — COMPREHENSIVE METABOLIC PANEL
ALT: 15 U/L (ref 14–54)
ANION GAP: 10 (ref 5–15)
AST: 19 U/L (ref 15–41)
Albumin: 3.8 g/dL (ref 3.5–5.0)
Alkaline Phosphatase: 144 U/L — ABNORMAL HIGH (ref 38–126)
BUN: 47 mg/dL — AB (ref 6–20)
CALCIUM: 9 mg/dL (ref 8.9–10.3)
CO2: 20 mmol/L — ABNORMAL LOW (ref 22–32)
CREATININE: 2.24 mg/dL — AB (ref 0.44–1.00)
Chloride: 110 mmol/L (ref 101–111)
GFR calc non Af Amer: 28 mL/min — ABNORMAL LOW (ref >60)
GFR, EST AFRICAN AMERICAN: 32 mL/min — AB (ref >60)
Glucose, Bld: 86 mg/dL (ref 70–99)
Potassium: 4.8 mmol/L (ref 3.5–5.1)
Sodium: 140 mmol/L (ref 135–145)
Total Bilirubin: 0.4 mg/dL (ref 0.3–1.2)
Total Protein: 7.3 g/dL (ref 6.5–8.1)

## 2015-01-13 LAB — URINALYSIS, ROUTINE W REFLEX MICROSCOPIC
BILIRUBIN URINE: NEGATIVE
Glucose, UA: NEGATIVE mg/dL
Hgb urine dipstick: NEGATIVE
Ketones, ur: NEGATIVE mg/dL
NITRITE: NEGATIVE
PROTEIN: NEGATIVE mg/dL
Specific Gravity, Urine: 1.014 (ref 1.005–1.030)
UROBILINOGEN UA: 0.2 mg/dL (ref 0.0–1.0)
pH: 7 (ref 5.0–8.0)

## 2015-01-13 LAB — URINE MICROSCOPIC-ADD ON

## 2015-01-13 LAB — LACTIC ACID, PLASMA: Lactic Acid, Venous: 0.8 mmol/L (ref 0.5–2.0)

## 2015-01-13 LAB — PREGNANCY, URINE: Preg Test, Ur: NEGATIVE

## 2015-01-13 MED ORDER — KETOROLAC TROMETHAMINE 30 MG/ML IJ SOLN: 30.0000 mg | Freq: Once | INTRAMUSCULAR | Status: DC

## 2015-01-13 MED ORDER — SODIUM CHLORIDE 0.9 % IV BOLUS (SEPSIS)
1000.0000 mL | Freq: Once | INTRAVENOUS | Status: AC
  Administered 2015-01-13: 1000 mL via INTRAVENOUS

## 2015-01-13 MED ORDER — ONDANSETRON HCL 4 MG/2ML IJ SOLN
4.0000 mg | Freq: Once | INTRAMUSCULAR | Status: AC
  Administered 2015-01-13: 4 mg via INTRAVENOUS
  Filled 2015-01-13: qty 2

## 2015-01-13 NOTE — ED Notes (Signed)
Unsuccessful lab draw, main lab notified. Labels at bedside.

## 2015-01-13 NOTE — ED Notes (Signed)
Offered pt Toradol for pain,  She refused , said she was told to only take Dilaudid for  Pain,

## 2015-01-13 NOTE — ED Notes (Signed)
Pt requested IV be removed because she wanted to leave now,  Dr Colin Rhein aware

## 2015-01-13 NOTE — ED Notes (Signed)
Spoke with Dr Colin Rhein in reference to pt's pain,  No new order to follow

## 2015-01-13 NOTE — ED Notes (Signed)
Unsuccessful at drawing labs, let the nurse know

## 2015-01-13 NOTE — ED Notes (Signed)
Hold on Toradol ,  Talk with Dr Colin Rhein,  Pt is kidney transplant patient

## 2015-01-13 NOTE — ED Provider Notes (Signed)
CSN: [id number]     Arrival date & time 01/12/15  2324 History   First MD Initiated Contact with Patient 01/13/15 0021     Chief Complaint  Patient presents with  . Flank Pain     (Consider location/radiation/quality/duration/timing/severity/associated sxs/prior Treatment) Patient is a 34 y.o. female presenting with abdominal pain.  Abdominal Pain Pain location:  LUQ Pain quality: cramping and sharp   Pain radiates to:  Does not radiate Pain severity:  Severe Onset quality:  Sudden Duration:  3 hours Timing:  Constant Progression:  Unchanged Chronicity:  Chronic Context comment:  Ho repeated visits for abd pain without clear etiology, prior gastroparesis, prior renal transplant and pancreatic transplant Relieved by:  Nothing Worsened by:  Nothing tried Associated symptoms: anorexia, diarrhea, nausea and vomiting   Associated symptoms: no fever     Past Medical History  Diagnosis Date  . Vision loss   . Renal disorder   . Anxiety   . Gastroparesis   . Narcotic abuse   . Non compliance with medical treatment    Past Surgical History  Procedure Laterality Date  . Refractive surgery    . Eye surgery    . Av fistula placement  11/17/2011    Procedure: ARTERIOVENOUS (AV) FISTULA CREATION;  Surgeon: Rosetta Posner, MD;  Location: [place];  Service: Vascular;  Laterality: Right;  . Kidney transplant    . Pancrease transplant     Family History  Problem Relation Age of Onset  . Hypertension Father    History  Substance Use Topics  . Smoking status: Never Smoker   . Smokeless tobacco: Never Used     Comment: Hooka  . Alcohol Use: No   OB History    No data available     Review of Systems  Constitutional: Negative for fever.  Gastrointestinal: Positive for nausea, vomiting, abdominal pain, diarrhea and anorexia.  All other systems reviewed and are negative.     Allergies  Fish-derived products and Shrimp  Home Medications   Prior to Admission medications    Medication Sig Start Date End Date Taking? Authorizing Provider  aspirin EC 81 MG tablet Take 81 mg by mouth every morning.   Yes Historical Provider, MD  cycloSPORINE modified (NEORAL) 100 MG capsule Take 200 mg by mouth See admin instructions. Takes with 25 mg capsule to equal 250 mg twice daily   Yes Historical Provider, MD  cycloSPORINE modified (NEORAL) 25 MG capsule Take 50 mg by mouth 2 (two) times daily. Take along with 2 capsules of the 100mg  to equal 250mg    Yes Historical Provider, MD  ibuprofen (ADVIL,MOTRIN) 800 MG tablet Take 1 tablet (800 mg total) by mouth 3 (three) times daily. 12/22/14  Yes Noemi Chapel, MD  mycophenolate (MYFORTIC) 180 MG EC tablet Take 540 mg by mouth 2 (two) times daily.   Yes Historical Provider, MD  sodium bicarbonate 650 MG tablet Take 1,300 mg by mouth 2 (two) times daily.   Yes Historical Provider, MD  acyclovir (ZOVIRAX) 400 MG tablet Take 2 tablets (800 mg total) by mouth 5 (five) times daily. Patient not taking: Reported on 10/29/2014 09/08/14   Jarrett Soho Muthersbaugh, PA-C  amoxicillin-clavulanate (AUGMENTIN) 875-125 MG per tablet Take 1 tablet by mouth 2 (two) times daily. One po bid x 7 days Patient not taking: Reported on 12/22/2014 10/29/14   Charlesetta Shanks, MD  oxyCODONE-acetaminophen (PERCOCET) 5-325 MG per tablet Take 1 tablet by mouth every 4 (four) hours as needed. Patient not taking: Reported  on 01/12/2015 12/22/14   Noemi Chapel, MD  promethazine (PHENERGAN) 25 MG suppository Place 1 suppository (25 mg total) rectally every 6 (six) hours as needed for nausea or vomiting. Patient not taking: Reported on 09/07/2014 02/22/14   Evelina Bucy, MD   BP 155/81 mmHg  Pulse 94  Temp(Src) 98 F (36.7 C) (Oral)  Resp 18  Ht 5\' 2"  (1.575 m)  Wt 143 lb (64.864 kg)  BMI 26.15 kg/m2  SpO2 100%  LMP 12/25/2014 Physical Exam  Constitutional: She is oriented to person, place, and time. She appears well-developed and well-nourished.  HENT:  Head: Normocephalic  and atraumatic.  Right Ear: External ear normal.  Left Ear: External ear normal.  Eyes: Conjunctivae and EOM are normal. Pupils are equal, round, and reactive to light.  Neck: Normal range of motion. Neck supple.  Cardiovascular: Normal rate, regular rhythm, normal heart sounds and intact distal pulses.   Pulmonary/Chest: Effort normal and breath sounds normal.  Abdominal: Soft. Bowel sounds are normal. There is tenderness in the left upper quadrant.  Musculoskeletal: Normal range of motion.  Neurological: She is alert and oriented to person, place, and time.  Skin: Skin is warm and dry.  Vitals reviewed.   ED Course  Procedures (including critical care time) Labs Review Labs Reviewed  URINALYSIS, ROUTINE W REFLEX MICROSCOPIC - Abnormal; Notable for the following:    Leukocytes, UA TRACE (*)    All other components within normal limits  CBC WITH DIFFERENTIAL/PLATELET - Abnormal; Notable for the following:    Platelets 140 (*)    All other components within normal limits  COMPREHENSIVE METABOLIC PANEL - Abnormal; Notable for the following:    CO2 20 (*)    BUN 47 (*)    Creatinine, Ser 2.24 (*)    Alkaline Phosphatase 144 (*)    GFR calc non Af Amer 28 (*)    GFR calc Af Amer 32 (*)    All other components within normal limits  BLOOD GAS, VENOUS - Abnormal; Notable for the following:    pH, Ven 7.308 (*)    pCO2, Ven 41.9 (*)    Acid-base deficit 5.2 (*)    All other components within normal limits  URINE CULTURE  URINE MICROSCOPIC-ADD ON  LACTIC ACID, PLASMA  PREGNANCY, URINE  LACTIC ACID, PLASMA  POC URINE PREG, ED    Imaging Review No results found.   EKG Interpretation None      MDM   Final diagnoses:  Abdominal pain, acute    34 y.o. female with pertinent PMH of drug seeking behavior, chronic abd pain, DM, prior pancreatic and renal transplant presents with recurrent abdominal pain. Patient has a history of requesting Dilaudid and leaving AMA when  refused narcotics. On arrival today vitals signs and physical exam as above.  Initial differential included gastroparesis, nephrolithiasis. Plan to obtain lab work and CT scan of the abdomen.  Unfortunately prior to receiving CT scan the patient eloped from the emergency department without being explained risks of leaving against medical advice. Do not feel the patient was intoxicated, feel that she was competent to make this decision.    I have reviewed all laboratory and imaging studies if ordered as above  1. Abdominal pain, acute         Debby Freiberg, MD 01/13/15 0430

## 2015-01-13 NOTE — ED Notes (Signed)
Pt left without signing AMA,

## 2015-01-14 LAB — URINE CULTURE
COLONY COUNT: NO GROWTH
CULTURE: NO GROWTH

## 2015-07-07 ENCOUNTER — Encounter (HOSPITAL_COMMUNITY): Payer: Self-pay | Admitting: *Deleted

## 2015-07-07 ENCOUNTER — Emergency Department (HOSPITAL_COMMUNITY)
Admission: EM | Admit: 2015-07-07 | Discharge: 2015-07-08 | Disposition: A | Discharge status: Home / Self Care | Payer: Medicare Other | Attending: Emergency Medicine | Admitting: Emergency Medicine

## 2015-07-07 DIAGNOSIS — Z79899 Other long term (current) drug therapy: Secondary | ICD-10-CM | POA: Diagnosis not present

## 2015-07-07 DIAGNOSIS — J029 Acute pharyngitis, unspecified: Secondary | ICD-10-CM | POA: Diagnosis not present

## 2015-07-07 DIAGNOSIS — Z87448 Personal history of other diseases of urinary system: Secondary | ICD-10-CM | POA: Insufficient documentation

## 2015-07-07 DIAGNOSIS — Z8719 Personal history of other diseases of the digestive system: Secondary | ICD-10-CM | POA: Insufficient documentation

## 2015-07-07 DIAGNOSIS — R109 Unspecified abdominal pain: Secondary | ICD-10-CM | POA: Insufficient documentation

## 2015-07-07 DIAGNOSIS — G8929 Other chronic pain: Secondary | ICD-10-CM | POA: Insufficient documentation

## 2015-07-07 DIAGNOSIS — H9209 Otalgia, unspecified ear: Secondary | ICD-10-CM | POA: Diagnosis not present

## 2015-07-07 DIAGNOSIS — Z7982 Long term (current) use of aspirin: Secondary | ICD-10-CM | POA: Insufficient documentation

## 2015-07-07 DIAGNOSIS — R1912 Hyperactive bowel sounds: Secondary | ICD-10-CM | POA: Insufficient documentation

## 2015-07-07 DIAGNOSIS — H547 Unspecified visual loss: Secondary | ICD-10-CM | POA: Diagnosis not present

## 2015-07-07 NOTE — ED Provider Notes (Signed)
CSN: [id number]     Arrival date & time 07/07/15  2211 History  By signing my name below, I, [name] [name], attest that this documentation has been prepared under the direction and in the presence of Mcclain Shall, MD. Electronically Signed: [name], ED Scribe. 07/08/2015. 12:27 AM.    Chief Complaint  Patient presents with  . Otalgia  . Abdominal Pain   Patient is a 34 y.o. female presenting with ear pain and abdominal pain. The history is provided by the patient. No language interpreter was used.  Otalgia Location:  Bilateral Behind ear:  No abnormality Quality:  Unable to specify Severity:  Moderate Onset quality:  Gradual Duration:  3 hours Timing:  Constant Progression:  Worsening Chronicity:  Recurrent Context: not direct blow and not foreign body in ear   Relieved by:  Nothing Worsened by:  Nothing tried Ineffective treatments:  OTC medications Associated symptoms: abdominal pain and sore throat   Associated symptoms: no hearing loss   Abdominal pain:    Location:  Generalized   Quality:  Aching   Severity:  Severe   Onset quality:  Gradual   Timing:  Constant   Progression:  Unchanged   Chronicity:  Chronic Risk factors: no recent travel   Abdominal Pain Pain location:  Generalized Pain radiates to:  Does not radiate Pain severity:  Moderate Onset quality:  Gradual Timing:  Constant Progression:  Worsening Chronicity:  Chronic Relieved by:  Nothing Worsened by:  Nothing tried Ineffective treatments:  Acetaminophen Associated symptoms: sore throat   Associated symptoms: no constipation    HPI Comments: [name] [name] is a 34 y.o. female with a hx of renal disorder, gastroparesis, narcotic abuse, and non compliance with medical treatment who presents to the Emergency Department complaining of gradually worsening constant generalized abdominal pain onset today. She reports associated constant bilateral ear pain, sore throat, and a loose BM approx 8  hours ago. Pt states that she took Tylenol with no relief. Pt reports that she has an appointment tomorrow with her PCP at [facility] at 10 am.   Past Medical History  Diagnosis Date  . Vision loss   . Renal disorder   . Anxiety   . Gastroparesis   . Narcotic abuse   . Non compliance with medical treatment    Past Surgical History  Procedure Laterality Date  . Refractive surgery    . Eye surgery    . Av fistula placement  11/17/2011    Procedure: ARTERIOVENOUS (AV) FISTULA CREATION;  Surgeon: Rosetta Posner, MD;  Location: [facility];  Service: Vascular;  Laterality: Right;  . Kidney transplant    . Pancrease transplant     Family History  Problem Relation Age of Onset  . Hypertension Father    Social History  Substance Use Topics  . Smoking status: Never Smoker   . Smokeless tobacco: Never Used     Comment: Hooka  . Alcohol Use: No   OB History    No data available     Review of Systems  HENT: Positive for ear pain and sore throat. Negative for hearing loss.   Gastrointestinal: Positive for abdominal pain. Negative for constipation.  All other systems reviewed and are negative.     Allergies  Fish-derived products and Shrimp  Home Medications   Prior to Admission medications   Medication Sig Start Date End Date Taking? Authorizing Provider  acyclovir (ZOVIRAX) 400 MG tablet Take 2 tablets (800 mg total) by mouth 5 (five) times  daily. Patient not taking: Reported on 10/29/2014 09/08/14   Jarrett Soho Muthersbaugh, PA-C  amoxicillin-clavulanate (AUGMENTIN) 875-125 MG per tablet Take 1 tablet by mouth 2 (two) times daily. One po bid x 7 days Patient not taking: Reported on 12/22/2014 10/29/14   Charlesetta Shanks, MD  aspirin EC 81 MG tablet Take 81 mg by mouth every morning.    Historical Provider, MD  cycloSPORINE modified (NEORAL) 100 MG capsule Take 200 mg by mouth See admin instructions. Takes with 25 mg capsule to equal 250 mg twice daily    Historical Provider, MD  cycloSPORINE  modified (NEORAL) 25 MG capsule Take 50 mg by mouth 2 (two) times daily. Take along with 2 capsules of the 100mg  to equal 250mg     Historical Provider, MD  ibuprofen (ADVIL,MOTRIN) 800 MG tablet Take 1 tablet (800 mg total) by mouth 3 (three) times daily. 12/22/14   Noemi Chapel, MD  mycophenolate (MYFORTIC) 180 MG EC tablet Take 540 mg by mouth 2 (two) times daily.    Historical Provider, MD  oxyCODONE-acetaminophen (PERCOCET) 5-325 MG per tablet Take 1 tablet by mouth every 4 (four) hours as needed. Patient not taking: Reported on 01/12/2015 12/22/14   Noemi Chapel, MD  promethazine (PHENERGAN) 25 MG suppository Place 1 suppository (25 mg total) rectally every 6 (six) hours as needed for nausea or vomiting. Patient not taking: Reported on 09/07/2014 02/22/14   Evelina Bucy, MD  sodium bicarbonate 650 MG tablet Take 1,300 mg by mouth 2 (two) times daily.    Historical Provider, MD   BP 175/88 mmHg  Pulse 97  Temp(Src) 98.9 F (37.2 C) (Oral)  Resp 18  SpO2 100%  LMP 06/20/2015 Physical Exam  Constitutional: She is oriented to person, place, and time. She appears well-developed and well-nourished. No distress.  HENT:  Head: Normocephalic and atraumatic.  Right Ear: Tympanic membrane, external ear and ear canal normal.  Left Ear: Tympanic membrane, external ear and ear canal normal.  Mouth/Throat: Oropharynx is clear and moist. No uvula swelling.  No mastoid tenderness  Eyes: Conjunctivae and EOM are normal. Pupils are equal, round, and reactive to light.  Neck: Normal range of motion. Neck supple. No tracheal deviation present.  Cardiovascular: Normal rate.   Pulmonary/Chest: Effort normal. No respiratory distress. She has no wheezes. She has no rales.  Lungs clear  Abdominal: Soft. Bowel sounds are normal. There is no rebound and no guarding.  Hyperactive bowel sounds  Musculoskeletal: Normal range of motion.  Lymphadenopathy:    She has no cervical adenopathy.  Neurological: She is  alert and oriented to person, place, and time.  Skin: Skin is warm and dry.  Psychiatric: She has a normal mood and affect. Her behavior is normal.  Nursing note and vitals reviewed.   ED Course  Procedures (including critical care time) DIAGNOSTIC STUDIES: Oxygen Saturation is 100% on RA, normal by my interpretation.    COORDINATION OF CARE: 12:26 AM- Pt advised of plan for treatment and pt agrees. Will receive a GI cocktail.    Labs Review Labs Reviewed  CBC WITH DIFFERENTIAL/PLATELET  COMPREHENSIVE METABOLIC PANEL  LIPASE, BLOOD   Tynia Wiers, MD has personally reviewed and evaluated these lab results as part of her medical decision-making.   EKG Interpretation None      MDM   Final diagnoses:  None   Results for orders placed or performed during the hospital encounter of 07/07/15  Rapid strep screen  Result Value Ref Range   Streptococcus, Group A Screen (  Direct) NEGATIVE NEGATIVE  CBC with Differential/Platelet  Result Value Ref Range   WBC 8.4 4.0 - 10.5 K/uL   RBC 4.99 3.87 - 5.11 MIL/uL   Hemoglobin 15.1 (H) 12.0 - 15.0 g/dL   HCT 43.3 36.0 - 46.0 %   MCV 86.8 78.0 - 100.0 fL   MCH 30.3 26.0 - 34.0 pg   MCHC 34.9 30.0 - 36.0 g/dL   RDW 13.7 11.5 - 15.5 %   Platelets 147 (L) 150 - 400 K/uL   Neutrophils Relative % 90 %   Neutro Abs 7.5 1.7 - 7.7 K/uL   Lymphocytes Relative 7 %   Lymphs Abs 0.6 (L) 0.7 - 4.0 K/uL   Monocytes Relative 3 %   Monocytes Absolute 0.3 0.1 - 1.0 K/uL   Eosinophils Relative 0 %   Eosinophils Absolute 0.0 0.0 - 0.7 K/uL   Basophils Relative 0 %   Basophils Absolute 0.0 0.0 - 0.1 K/uL  Comprehensive metabolic panel  Result Value Ref Range   Sodium 136 135 - 145 mmol/L   Potassium 4.6 3.5 - 5.1 mmol/L   Chloride 112 (H) 101 - 111 mmol/L   CO2 17 (L) 22 - 32 mmol/L   Glucose, Bld 119 (H) 65 - 99 mg/dL   BUN 44 (H) 6 - 20 mg/dL   Creatinine, Ser 3.16 (H) 0.44 - 1.00 mg/dL   Calcium 9.5 8.9 - 10.3 mg/dL   Total Protein 8.3  (H) 6.5 - 8.1 g/dL   Albumin 4.1 3.5 - 5.0 g/dL   AST 29 15 - 41 U/L   ALT 46 14 - 54 U/L   Alkaline Phosphatase 76 38 - 126 U/L   Total Bilirubin 1.2 0.3 - 1.2 mg/dL   GFR calc non Af Amer 18 (L) >60 mL/min   GFR calc Af Amer 21 (L) >60 mL/min   Anion gap 7 5 - 15  Lipase, blood  Result Value Ref Range   Lipase 22 11 - 51 U/L   No results found.  Medications  dicyclomine (BENTYL) injection 20 mg (20 mg Intramuscular Given 07/08/15 0137)  promethazine (PHENERGAN) injection 12.5 mg (12.5 mg Intramuscular Given 07/08/15 0145)  gi cocktail (Maalox,Lidocaine,Donnatal) (30 mLs Oral Given 07/08/15 0143)  metoCLOPramide (REGLAN) injection 10 mg (10 mg Intramuscular Given 07/08/15 0143)    Chronic abdominal pain, has appointment in the am with her physicians at [facility].  Close follow up   I personally performed the services described in this documentation, which was scribed in my presence. The recorded information has been reviewed and is accurate.     Veatrice Kells, MD 07/08/15 [phone]

## 2015-07-07 NOTE — ED Notes (Signed)
Pt complains of abdominal pain for the past 5 hours. Pt has hx of kidney and pancreas transplant, states she was threw up her antirejection medications this morning. Pt states her PCP told her to come tomorrow at 10am to check kidney and pancreas labwork. Pt states also 7PM today.

## 2015-07-07 NOTE — ED Notes (Signed)
Pt transported from home by EMS with c/o bilat ear pain and sore throat. Pt called [facility] for same, appointment noted 07/08/15

## 2015-07-08 ENCOUNTER — Encounter (HOSPITAL_COMMUNITY): Payer: Self-pay | Admitting: Emergency Medicine

## 2015-07-08 DIAGNOSIS — R109 Unspecified abdominal pain: Secondary | ICD-10-CM | POA: Diagnosis not present

## 2015-07-08 LAB — CBC WITH DIFFERENTIAL/PLATELET
BASOS PCT: 0 %
Basophils Absolute: 0 10*3/uL (ref 0.0–0.1)
Eosinophils Absolute: 0 10*3/uL (ref 0.0–0.7)
Eosinophils Relative: 0 %
HEMATOCRIT: 43.3 % (ref 36.0–46.0)
HEMOGLOBIN: 15.1 g/dL — AB (ref 12.0–15.0)
LYMPHS PCT: 7 %
Lymphs Abs: 0.6 10*3/uL — ABNORMAL LOW (ref 0.7–4.0)
MCH: 30.3 pg (ref 26.0–34.0)
MCHC: 34.9 g/dL (ref 30.0–36.0)
MCV: 86.8 fL (ref 78.0–100.0)
Monocytes Absolute: 0.3 10*3/uL (ref 0.1–1.0)
Monocytes Relative: 3 %
NEUTROS PCT: 90 %
Neutro Abs: 7.5 10*3/uL (ref 1.7–7.7)
Platelets: 147 10*3/uL — ABNORMAL LOW (ref 150–400)
RBC: 4.99 MIL/uL (ref 3.87–5.11)
RDW: 13.7 % (ref 11.5–15.5)
WBC: 8.4 10*3/uL (ref 4.0–10.5)

## 2015-07-08 LAB — COMPREHENSIVE METABOLIC PANEL
ALT: 46 U/L (ref 14–54)
AST: 29 U/L (ref 15–41)
Albumin: 4.1 g/dL (ref 3.5–5.0)
Alkaline Phosphatase: 76 U/L (ref 38–126)
Anion gap: 7 (ref 5–15)
BUN: 44 mg/dL — AB (ref 6–20)
CALCIUM: 9.5 mg/dL (ref 8.9–10.3)
CO2: 17 mmol/L — AB (ref 22–32)
Chloride: 112 mmol/L — ABNORMAL HIGH (ref 101–111)
Creatinine, Ser: 3.16 mg/dL — ABNORMAL HIGH (ref 0.44–1.00)
GFR calc Af Amer: 21 mL/min — ABNORMAL LOW (ref >60)
GFR calc non Af Amer: 18 mL/min — ABNORMAL LOW (ref >60)
GLUCOSE: 119 mg/dL — AB (ref 65–99)
Potassium: 4.6 mmol/L (ref 3.5–5.1)
SODIUM: 136 mmol/L (ref 135–145)
Total Bilirubin: 1.2 mg/dL (ref 0.3–1.2)
Total Protein: 8.3 g/dL — ABNORMAL HIGH (ref 6.5–8.1)

## 2015-07-08 LAB — LIPASE, BLOOD: Lipase: 22 U/L (ref 11–51)

## 2015-07-08 LAB — RAPID STREP SCREEN (MED CTR MEBANE ONLY): Streptococcus, Group A Screen (Direct): NEGATIVE

## 2015-07-08 MED ORDER — METOCLOPRAMIDE HCL 5 MG/ML IJ SOLN
10.0000 mg | Freq: Once | INTRAMUSCULAR | Status: AC
  Administered 2015-07-08: 10 mg via INTRAMUSCULAR
  Filled 2015-07-08: qty 2

## 2015-07-08 MED ORDER — DICYCLOMINE HCL 10 MG/ML IM SOLN
20.0000 mg | Freq: Once | INTRAMUSCULAR | Status: AC
  Administered 2015-07-08: 20 mg via INTRAMUSCULAR
  Filled 2015-07-08: qty 2

## 2015-07-08 MED ORDER — GI COCKTAIL ~~LOC~~
30.0000 mL | Freq: Once | ORAL | Status: AC
  Administered 2015-07-08: 30 mL via ORAL
  Filled 2015-07-08: qty 30

## 2015-07-08 MED ORDER — DICYCLOMINE HCL 20 MG PO TABS: 20.0000 mg | ORAL_TABLET | Freq: Two times a day (BID) | ORAL | Status: DC

## 2015-07-08 MED ORDER — PROMETHAZINE HCL 25 MG/ML IJ SOLN
12.5000 mg | Freq: Once | INTRAMUSCULAR | Status: AC
  Administered 2015-07-08: 12.5 mg via INTRAMUSCULAR
  Filled 2015-07-08: qty 1

## 2015-07-08 NOTE — ED Notes (Signed)
First blood draw attempt unsuccessful. RN notified, RN states she will put in for IV team consult.

## 2015-07-10 LAB — CULTURE, GROUP A STREP: STREP A CULTURE: NEGATIVE

## 2015-09-07 DIAGNOSIS — I120 Hypertensive chronic kidney disease with stage 5 chronic kidney disease or end stage renal disease: Secondary | ICD-10-CM | POA: Diagnosis not present

## 2015-09-07 DIAGNOSIS — Z940 Kidney transplant status: Secondary | ICD-10-CM | POA: Diagnosis not present

## 2015-09-07 DIAGNOSIS — R300 Dysuria: Secondary | ICD-10-CM | POA: Diagnosis not present

## 2015-09-07 DIAGNOSIS — Z48288 Encounter for aftercare following multiple organ transplant: Secondary | ICD-10-CM | POA: Diagnosis not present

## 2015-09-07 DIAGNOSIS — Z7952 Long term (current) use of systemic steroids: Secondary | ICD-10-CM | POA: Diagnosis not present

## 2015-09-07 DIAGNOSIS — N186 End stage renal disease: Secondary | ICD-10-CM | POA: Diagnosis not present

## 2015-09-07 DIAGNOSIS — Z7982 Long term (current) use of aspirin: Secondary | ICD-10-CM | POA: Diagnosis not present

## 2015-09-07 DIAGNOSIS — Z792 Long term (current) use of antibiotics: Secondary | ICD-10-CM | POA: Diagnosis not present

## 2015-09-07 DIAGNOSIS — Z9483 Pancreas transplant status: Secondary | ICD-10-CM | POA: Diagnosis not present

## 2015-09-07 DIAGNOSIS — Z79899 Other long term (current) drug therapy: Secondary | ICD-10-CM | POA: Diagnosis not present

## 2015-09-07 DIAGNOSIS — E1021 Type 1 diabetes mellitus with diabetic nephropathy: Secondary | ICD-10-CM | POA: Diagnosis not present

## 2015-09-07 DIAGNOSIS — D899 Disorder involving the immune mechanism, unspecified: Secondary | ICD-10-CM | POA: Diagnosis not present

## 2015-09-07 DIAGNOSIS — Z794 Long term (current) use of insulin: Secondary | ICD-10-CM | POA: Diagnosis not present

## 2015-11-23 DIAGNOSIS — N186 End stage renal disease: Secondary | ICD-10-CM | POA: Diagnosis not present

## 2015-11-23 DIAGNOSIS — E1122 Type 2 diabetes mellitus with diabetic chronic kidney disease: Secondary | ICD-10-CM | POA: Diagnosis not present

## 2015-11-23 DIAGNOSIS — Z9483 Pancreas transplant status: Secondary | ICD-10-CM | POA: Diagnosis not present

## 2015-11-23 DIAGNOSIS — Z794 Long term (current) use of insulin: Secondary | ICD-10-CM | POA: Diagnosis not present

## 2015-11-23 DIAGNOSIS — I120 Hypertensive chronic kidney disease with stage 5 chronic kidney disease or end stage renal disease: Secondary | ICD-10-CM | POA: Diagnosis not present

## 2015-11-23 DIAGNOSIS — D899 Disorder involving the immune mechanism, unspecified: Secondary | ICD-10-CM | POA: Diagnosis not present

## 2015-11-23 DIAGNOSIS — Z4822 Encounter for aftercare following kidney transplant: Secondary | ICD-10-CM | POA: Diagnosis not present

## 2015-11-23 DIAGNOSIS — E872 Acidosis: Secondary | ICD-10-CM | POA: Diagnosis not present

## 2015-11-23 DIAGNOSIS — Z7982 Long term (current) use of aspirin: Secondary | ICD-10-CM | POA: Diagnosis not present

## 2015-12-14 DIAGNOSIS — R1032 Left lower quadrant pain: Secondary | ICD-10-CM | POA: Diagnosis not present

## 2015-12-31 DIAGNOSIS — R1032 Left lower quadrant pain: Secondary | ICD-10-CM | POA: Diagnosis not present

## 2015-12-31 DIAGNOSIS — I120 Hypertensive chronic kidney disease with stage 5 chronic kidney disease or end stage renal disease: Secondary | ICD-10-CM | POA: Diagnosis not present

## 2015-12-31 DIAGNOSIS — D259 Leiomyoma of uterus, unspecified: Secondary | ICD-10-CM | POA: Diagnosis not present

## 2015-12-31 DIAGNOSIS — N186 End stage renal disease: Secondary | ICD-10-CM | POA: Diagnosis not present

## 2015-12-31 DIAGNOSIS — N898 Other specified noninflammatory disorders of vagina: Secondary | ICD-10-CM | POA: Diagnosis not present

## 2015-12-31 DIAGNOSIS — E119 Type 2 diabetes mellitus without complications: Secondary | ICD-10-CM | POA: Diagnosis not present

## 2015-12-31 DIAGNOSIS — Z9483 Pancreas transplant status: Secondary | ICD-10-CM | POA: Diagnosis not present

## 2016-01-01 DIAGNOSIS — R1032 Left lower quadrant pain: Secondary | ICD-10-CM | POA: Diagnosis not present

## 2016-01-01 DIAGNOSIS — D259 Leiomyoma of uterus, unspecified: Secondary | ICD-10-CM | POA: Diagnosis not present

## 2016-01-09 DIAGNOSIS — N390 Urinary tract infection, site not specified: Secondary | ICD-10-CM | POA: Diagnosis not present

## 2016-01-09 DIAGNOSIS — Z8659 Personal history of other mental and behavioral disorders: Secondary | ICD-10-CM | POA: Diagnosis not present

## 2016-01-09 DIAGNOSIS — N83209 Unspecified ovarian cyst, unspecified side: Secondary | ICD-10-CM | POA: Diagnosis not present

## 2016-01-09 DIAGNOSIS — D259 Leiomyoma of uterus, unspecified: Secondary | ICD-10-CM | POA: Diagnosis not present

## 2016-01-09 DIAGNOSIS — R110 Nausea: Secondary | ICD-10-CM | POA: Diagnosis not present

## 2016-01-09 DIAGNOSIS — Z9483 Pancreas transplant status: Secondary | ICD-10-CM | POA: Diagnosis not present

## 2016-01-09 DIAGNOSIS — Z8679 Personal history of other diseases of the circulatory system: Secondary | ICD-10-CM | POA: Diagnosis not present

## 2016-01-09 DIAGNOSIS — Z940 Kidney transplant status: Secondary | ICD-10-CM | POA: Diagnosis not present

## 2016-01-09 DIAGNOSIS — Z8639 Personal history of other endocrine, nutritional and metabolic disease: Secondary | ICD-10-CM | POA: Diagnosis not present

## 2016-01-09 DIAGNOSIS — K439 Ventral hernia without obstruction or gangrene: Secondary | ICD-10-CM | POA: Diagnosis not present

## 2016-01-09 DIAGNOSIS — Z87448 Personal history of other diseases of urinary system: Secondary | ICD-10-CM | POA: Diagnosis not present

## 2016-01-09 DIAGNOSIS — R112 Nausea with vomiting, unspecified: Secondary | ICD-10-CM | POA: Diagnosis not present

## 2016-01-09 DIAGNOSIS — N83201 Unspecified ovarian cyst, right side: Secondary | ICD-10-CM | POA: Diagnosis not present

## 2016-01-09 DIAGNOSIS — R1030 Lower abdominal pain, unspecified: Secondary | ICD-10-CM | POA: Diagnosis not present

## 2016-01-16 DIAGNOSIS — R300 Dysuria: Secondary | ICD-10-CM | POA: Diagnosis not present

## 2016-01-16 DIAGNOSIS — K3184 Gastroparesis: Secondary | ICD-10-CM | POA: Diagnosis not present

## 2016-01-16 DIAGNOSIS — N186 End stage renal disease: Secondary | ICD-10-CM | POA: Diagnosis not present

## 2016-01-16 DIAGNOSIS — E1143 Type 2 diabetes mellitus with diabetic autonomic (poly)neuropathy: Secondary | ICD-10-CM | POA: Diagnosis not present

## 2016-01-16 DIAGNOSIS — R1032 Left lower quadrant pain: Secondary | ICD-10-CM | POA: Diagnosis not present

## 2016-01-16 DIAGNOSIS — D259 Leiomyoma of uterus, unspecified: Secondary | ICD-10-CM | POA: Diagnosis not present

## 2016-01-16 DIAGNOSIS — Z940 Kidney transplant status: Secondary | ICD-10-CM | POA: Diagnosis not present

## 2016-01-16 DIAGNOSIS — Z3202 Encounter for pregnancy test, result negative: Secondary | ICD-10-CM | POA: Diagnosis not present

## 2016-01-16 DIAGNOSIS — I120 Hypertensive chronic kidney disease with stage 5 chronic kidney disease or end stage renal disease: Secondary | ICD-10-CM | POA: Diagnosis not present

## 2016-01-16 DIAGNOSIS — E1122 Type 2 diabetes mellitus with diabetic chronic kidney disease: Secondary | ICD-10-CM | POA: Diagnosis not present

## 2016-04-28 ENCOUNTER — Emergency Department (HOSPITAL_COMMUNITY): Payer: Medicare Other

## 2016-04-28 ENCOUNTER — Encounter (HOSPITAL_COMMUNITY): Payer: Self-pay | Admitting: *Deleted

## 2016-04-28 ENCOUNTER — Emergency Department (HOSPITAL_COMMUNITY)
Admission: EM | Admit: 2016-04-28 | Discharge: 2016-04-28 | Disposition: A | Discharge status: Home / Self Care | Payer: Medicare Other | Attending: Emergency Medicine | Admitting: Emergency Medicine

## 2016-04-28 DIAGNOSIS — Z7982 Long term (current) use of aspirin: Secondary | ICD-10-CM | POA: Insufficient documentation

## 2016-04-28 DIAGNOSIS — E1022 Type 1 diabetes mellitus with diabetic chronic kidney disease: Secondary | ICD-10-CM | POA: Insufficient documentation

## 2016-04-28 DIAGNOSIS — B9689 Other specified bacterial agents as the cause of diseases classified elsewhere: Secondary | ICD-10-CM | POA: Diagnosis not present

## 2016-04-28 DIAGNOSIS — N186 End stage renal disease: Secondary | ICD-10-CM | POA: Insufficient documentation

## 2016-04-28 DIAGNOSIS — Z79899 Other long term (current) drug therapy: Secondary | ICD-10-CM | POA: Insufficient documentation

## 2016-04-28 DIAGNOSIS — D259 Leiomyoma of uterus, unspecified: Secondary | ICD-10-CM | POA: Diagnosis not present

## 2016-04-28 DIAGNOSIS — G8929 Other chronic pain: Secondary | ICD-10-CM | POA: Diagnosis not present

## 2016-04-28 DIAGNOSIS — I120 Hypertensive chronic kidney disease with stage 5 chronic kidney disease or end stage renal disease: Secondary | ICD-10-CM | POA: Insufficient documentation

## 2016-04-28 DIAGNOSIS — N760 Acute vaginitis: Secondary | ICD-10-CM | POA: Insufficient documentation

## 2016-04-28 DIAGNOSIS — R1032 Left lower quadrant pain: Secondary | ICD-10-CM | POA: Diagnosis present

## 2016-04-28 DIAGNOSIS — R102 Pelvic and perineal pain: Secondary | ICD-10-CM | POA: Insufficient documentation

## 2016-04-28 LAB — GC/CHLAMYDIA PROBE AMP (~~LOC~~) NOT AT ARMC
CHLAMYDIA, DNA PROBE: NEGATIVE
Neisseria Gonorrhea: NEGATIVE

## 2016-04-28 LAB — COMPREHENSIVE METABOLIC PANEL
ALK PHOS: 102 U/L (ref 38–126)
ALT: 79 U/L — AB (ref 14–54)
AST: 35 U/L (ref 15–41)
Albumin: 3.7 g/dL (ref 3.5–5.0)
Anion gap: 6 (ref 5–15)
BILIRUBIN TOTAL: 0.6 mg/dL (ref 0.3–1.2)
BUN: 40 mg/dL — AB (ref 6–20)
CALCIUM: 9.2 mg/dL (ref 8.9–10.3)
CO2: 20 mmol/L — ABNORMAL LOW (ref 22–32)
Chloride: 114 mmol/L — ABNORMAL HIGH (ref 101–111)
Creatinine, Ser: 2.66 mg/dL — ABNORMAL HIGH (ref 0.44–1.00)
GFR calc Af Amer: 26 mL/min — ABNORMAL LOW (ref >60)
GFR, EST NON AFRICAN AMERICAN: 22 mL/min — AB (ref >60)
Glucose, Bld: 76 mg/dL (ref 65–99)
Potassium: 4.6 mmol/L (ref 3.5–5.1)
Sodium: 140 mmol/L (ref 135–145)
TOTAL PROTEIN: 7 g/dL (ref 6.5–8.1)

## 2016-04-28 LAB — WET PREP, GENITAL
SPERM: NONE SEEN
Trich, Wet Prep: NONE SEEN
YEAST WET PREP: NONE SEEN

## 2016-04-28 LAB — URINALYSIS, ROUTINE W REFLEX MICROSCOPIC
BILIRUBIN URINE: NEGATIVE
Glucose, UA: NEGATIVE mg/dL
KETONES UR: NEGATIVE mg/dL
LEUKOCYTES UA: NEGATIVE
NITRITE: NEGATIVE
PH: 5.5 (ref 5.0–8.0)
Protein, ur: 30 mg/dL — AB
SPECIFIC GRAVITY, URINE: 1.016 (ref 1.005–1.030)

## 2016-04-28 LAB — HIV ANTIBODY (ROUTINE TESTING W REFLEX): HIV Screen 4th Generation wRfx: NONREACTIVE

## 2016-04-28 LAB — CBC
HCT: 43.9 % (ref 36.0–46.0)
Hemoglobin: 14.8 g/dL (ref 12.0–15.0)
MCH: 29.7 pg (ref 26.0–34.0)
MCHC: 33.7 g/dL (ref 30.0–36.0)
MCV: 88 fL (ref 78.0–100.0)
Platelets: 147 10*3/uL — ABNORMAL LOW (ref 150–400)
RBC: 4.99 MIL/uL (ref 3.87–5.11)
RDW: 13.6 % (ref 11.5–15.5)
WBC: 4.9 10*3/uL (ref 4.0–10.5)

## 2016-04-28 LAB — URINE MICROSCOPIC-ADD ON

## 2016-04-28 LAB — POC URINE PREG, ED: PREG TEST UR: NEGATIVE

## 2016-04-28 LAB — LIPASE, BLOOD: Lipase: 32 U/L (ref 11–51)

## 2016-04-28 MED ORDER — ONDANSETRON 4 MG PO TBDP
4.0000 mg | ORAL_TABLET | Freq: Once | ORAL | Status: DC
  Filled 2016-04-28: qty 1

## 2016-04-28 MED ORDER — ONDANSETRON HCL 4 MG/2ML IJ SOLN
4.0000 mg | Freq: Once | INTRAMUSCULAR | Status: AC
  Administered 2016-04-28: 4 mg via INTRAVENOUS
  Filled 2016-04-28: qty 2

## 2016-04-28 MED ORDER — METRONIDAZOLE 500 MG PO TABS: 500.0000 mg | ORAL_TABLET | Freq: Two times a day (BID) | ORAL | 0 refills | Status: DC

## 2016-04-28 MED ORDER — KETOROLAC TROMETHAMINE 30 MG/ML IJ SOLN: 30.0000 mg | Freq: Once | INTRAMUSCULAR | Status: DC

## 2016-04-28 MED ORDER — MORPHINE SULFATE (PF) 4 MG/ML IV SOLN
6.0000 mg | Freq: Once | INTRAVENOUS | Status: AC
  Administered 2016-04-28: 6 mg via INTRAVENOUS
  Filled 2016-04-28: qty 2

## 2016-04-28 MED ORDER — SODIUM CHLORIDE 0.9 % IV BOLUS (SEPSIS)
1000.0000 mL | Freq: Once | INTRAVENOUS | Status: AC
  Administered 2016-04-28: 1000 mL via INTRAVENOUS

## 2016-04-28 MED ORDER — HYDROCODONE-ACETAMINOPHEN 5-325 MG PO TABS
2.0000 | ORAL_TABLET | Freq: Once | ORAL | Status: AC
  Administered 2016-04-28: 2 via ORAL
  Filled 2016-04-28 (×2): qty 2

## 2016-04-28 MED ORDER — MORPHINE SULFATE (PF) 4 MG/ML IV SOLN: 4.0000 mg | Freq: Once | INTRAVENOUS | Status: DC

## 2016-04-28 NOTE — ED Provider Notes (Signed)
[facility] DEPT Provider Note   CSN: [id number] Arrival date & time: 04/28/16  0024  By signing my name below, I, [name] [name], attest that this documentation has been prepared under the direction and in the presence of [name], MD. Electronically Signed: [name], ED Scribe. 04/28/16. 1:13 AM.   History   Chief Complaint Chief Complaint  Patient presents with  . Abdominal Pain  . Nausea  . Emesis   HPI Comments: [name] [name] is a 35 y.o. female with a hx of gastroparesis, renal disorder, DM type I, kidney transplant, and pancrease transplant who presents to the Emergency Department complaining of gradually worsening moderate sharp stabbing lower abdominal pain onset one week ago. She reports associated mild vaginal discharge, nausea, multiple episodes of non-bloody vomiting onset one week ago, and multiple episodes of non-bloody diarrhea onset 2 days ago. She adds that she had an episode of syncope that occurred when she stood up from a sitting position. She notes that the abdominal pain is worse with deep breathing and laying flat. No alleviating factors noted. Pt has not tried any medications PTA. She reports a hx of fibroids. She denies any fever, chills, or vaginal bleeding.   The history is provided by the patient. No language interpreter was used.  Abdominal Pain   This is a new problem. The current episode started more than 2 days ago. The problem has been gradually worsening. The pain is located in the LLQ and RLQ. Associated symptoms include diarrhea, nausea and vomiting. Pertinent negatives include fever. Nothing aggravates the symptoms. Nothing relieves the symptoms.  Emesis   Associated symptoms include abdominal pain and diarrhea. Pertinent negatives include no chills and no fever.    Past Medical History:  Diagnosis Date  . Anxiety   . Gastroparesis   . Narcotic abuse   . Non compliance with medical treatment   . Renal disorder   . Vision loss      Patient Active Problem List   Diagnosis Date Noted  . End stage renal disease ([place]) 02/06/2012  . Pleural effusion, bilateral 11/16/2011  . Anemia in chronic kidney disease (CKD) 11/16/2011  . Pericardial effusion 11/15/2011  . Fluid overload 11/15/2011  . Abdominal pain, epigastric 11/15/2011  . CKD (chronic kidney disease) stage 5, GFR less than 15 ml/min ([facility]) 11/03/2011  . Hypertension 10/27/2011  . Renal failure, chronic 10/27/2011  . MEDIAL EPICONDYLITIS, LEFT 12/17/2007  . ABSCESS, AXILLA, LEFT 12/03/2007  . DIABETES MELLITUS, TYPE I, UNCONTROLLED 11/05/2007    Past Surgical History:  Procedure Laterality Date  . AV FISTULA PLACEMENT  11/17/2011   Procedure: ARTERIOVENOUS (AV) FISTULA CREATION;  Surgeon: Rosetta Posner, MD;  Location: [facility];  Service: Vascular;  Laterality: Right;  . EYE SURGERY    . KIDNEY TRANSPLANT    . pancrease transplant    . REFRACTIVE SURGERY      OB History    No data available       Home Medications    Prior to Admission medications   Medication Sig Start Date End Date Taking? Authorizing Provider  acyclovir (ZOVIRAX) 400 MG tablet Take 2 tablets (800 mg total) by mouth 5 (five) times daily. Patient not taking: Reported on 10/29/2014 09/08/14   Jarrett Soho Muthersbaugh, PA-C  amoxicillin-clavulanate (AUGMENTIN) 875-125 MG per tablet Take 1 tablet by mouth 2 (two) times daily. One po bid x 7 days Patient not taking: Reported on 12/22/2014 10/29/14   Charlesetta Shanks, MD  aspirin EC 81 MG tablet Take 81  mg by mouth every morning.    Historical Provider, MD  cycloSPORINE modified (NEORAL) 100 MG capsule Take 200 mg by mouth See admin instructions. Takes with 25 mg capsule to equal 250 mg twice daily    Historical Provider, MD  cycloSPORINE modified (NEORAL) 25 MG capsule Take 50 mg by mouth 2 (two) times daily. Take along with 2 capsules of the 100mg  to equal 250mg     Historical Provider, MD  dicyclomine (BENTYL) 20 MG tablet Take 1 tablet (20 mg  total) by mouth 2 (two) times daily. 07/08/15   April Palumbo, MD  ibuprofen (ADVIL,MOTRIN) 800 MG tablet Take 1 tablet (800 mg total) by mouth 3 (three) times daily. 12/22/14   Noemi Chapel, MD  mycophenolate (MYFORTIC) 180 MG EC tablet Take 540 mg by mouth 2 (two) times daily.    Historical Provider, MD  oxyCODONE-acetaminophen (PERCOCET) 5-325 MG per tablet Take 1 tablet by mouth every 4 (four) hours as needed. Patient not taking: Reported on 01/12/2015 12/22/14   Noemi Chapel, MD  predniSONE (DELTASONE) 5 MG tablet  06/24/15   Historical Provider, MD  promethazine (PHENERGAN) 25 MG suppository Place 1 suppository (25 mg total) rectally every 6 (six) hours as needed for nausea or vomiting. Patient not taking: Reported on 09/07/2014 02/22/14   Evelina Bucy, MD  sodium bicarbonate 650 MG tablet Take 1,300 mg by mouth 2 (two) times daily.    Historical Provider, MD    Family History Family History  Problem Relation Age of Onset  . Hypertension Father     Social History Social History  Substance Use Topics  . Smoking status: Never Smoker  . Smokeless tobacco: Never Used     Comment: Hooka  . Alcohol use No     Allergies   Fish-derived products and Shrimp [shellfish allergy]   Review of Systems Review of Systems  Constitutional: Negative for chills and fever.  Gastrointestinal: Positive for abdominal pain, diarrhea, nausea and vomiting.  Genitourinary: Positive for vaginal discharge. Negative for vaginal bleeding.  All other systems reviewed and are negative.    Physical Exam Updated Vital Signs BP 198/85 (BP Location: Left Arm)   Pulse 84   Temp 98.1 F (36.7 C) (Oral)   Resp 22   Ht 5\' 2"  (1.575 m)   Wt 140 lb (63.5 kg)   LMP 04/13/2016   SpO2 100%   BMI 25.61 kg/m   Physical Exam  Constitutional: She is oriented to person, place, and time. She appears well-developed and well-nourished. No distress.  HENT:  Head: Normocephalic and atraumatic.  Nose: Nose normal.   Mouth/Throat: Oropharynx is clear and moist. No oropharyngeal exudate.  Eyes: Conjunctivae and EOM are normal. Pupils are equal, round, and reactive to light. No scleral icterus.  Neck: Normal range of motion. Neck supple. No JVD present. No tracheal deviation present. No thyromegaly present.  Cardiovascular: Normal rate, regular rhythm and normal heart sounds.  Exam reveals no gallop and no friction rub.   No murmur heard. Pulmonary/Chest: Effort normal and breath sounds normal. No respiratory distress. She has no wheezes. She exhibits no tenderness.  Abdominal: Soft. Bowel sounds are normal. She exhibits no distension and no mass. There is tenderness. There is no rebound and no guarding.  BLQ and suprapubic TTP  Musculoskeletal: Normal range of motion. She exhibits no edema or tenderness.  Lymphadenopathy:    She has no cervical adenopathy.  Neurological: She is alert and oriented to person, place, and time. No cranial nerve deficit. She  exhibits normal muscle tone.  Skin: Skin is warm and dry. No rash noted. No erythema. No pallor.  Nursing note and vitals reviewed.    ED Treatments / Results  DIAGNOSTIC STUDIES: Oxygen Saturation is 100% on RA, normal by my interpretation.   COORDINATION OF CARE: 12:57 AM- Pt advised of plan for treatment and pt agrees. Pt will receive lab work for further evaluation.    Labs (all labs ordered are listed, but only abnormal results are displayed) Labs Reviewed  LIPASE, BLOOD  COMPREHENSIVE METABOLIC PANEL  CBC  URINALYSIS, ROUTINE W REFLEX MICROSCOPIC (NOT AT [facility])  POC URINE PREG, ED    EKG  EKG Interpretation None       Radiology No results found.  Procedures Procedures (including critical care time)  Medications Ordered in ED Medications  sodium chloride 0.9 % bolus 1,000 mL (not administered)     Initial Impression / Assessment and Plan / ED Course  [name], MD has reviewed the triage vital signs and the nursing  notes.  Pertinent labs & imaging results that were available during my care of the patient were reviewed by me and considered in my medical decision making (see chart for details).  Clinical Course    Patient presents to the ED for chronic pelvic pain.  Physical exam was unremarkable, including pelvic exam.  No DC seen, no CMT or adnexal tenderness.  WP did reveal clue cells.  Will treat with flagyl.  TVUS negative for any acute changes to explain her pain. She was advised to fu with OB/GYN.  She was given one dose of IV morphine but conitnued to insist on dilaudid.  I have concern for drug seeking behavior in this patient.  She also asked for narcotics to go home with and I told her to ask her PCP as this is a chronic pain issue.  She appears well and in NAD. Vs remain within her normal limits and she is safe for DC.  Final Clinical Impressions(s) / ED Diagnoses   Final diagnoses:  None    New Prescriptions New Prescriptions   No medications on file      I personally performed the services described in this documentation, which was scribed in my presence. The recorded information has been reviewed and is accurate.      [name], MD 04/30/16 [phone]

## 2016-04-28 NOTE — ED Notes (Signed)
Attempted IV X1 in L AC. Vein blew, pt refused to let this RN stick anywhere else. Demands IV to be in L hand. Pt has tiny vein over knuckle and this RN did not feel comfortable sticking there. After vein blew in [facility], pt began speaking to SO in another language and stating "I told you so" this RN left room and informed primary nurse that she can look for IV.

## 2016-04-28 NOTE — ED Triage Notes (Addendum)
Pt c/o intermittent lower abdominal pain for two days. Pt states pain increases after vomiting. Pt states she got dizzy and lightheaded after going from a sitting to standing position. Pain started shortly after dizziness started. Pt has hx of kidney and pancrease transplant

## 2016-04-28 NOTE — ED Notes (Signed)
Patient states that she is hurting worse after the ultrasound, requesting more pain medication. MD made aware. No new orders at this time. MD at bedside.

## 2016-04-28 NOTE — ED Notes (Signed)
MD at bedside. 

## 2016-05-11 DIAGNOSIS — I129 Hypertensive chronic kidney disease with stage 1 through stage 4 chronic kidney disease, or unspecified chronic kidney disease: Secondary | ICD-10-CM | POA: Diagnosis not present

## 2016-05-11 DIAGNOSIS — D631 Anemia in chronic kidney disease: Secondary | ICD-10-CM | POA: Diagnosis not present

## 2016-05-11 DIAGNOSIS — N184 Chronic kidney disease, stage 4 (severe): Secondary | ICD-10-CM | POA: Diagnosis not present

## 2016-05-11 DIAGNOSIS — N2581 Secondary hyperparathyroidism of renal origin: Secondary | ICD-10-CM | POA: Diagnosis not present

## 2016-05-11 DIAGNOSIS — Z940 Kidney transplant status: Secondary | ICD-10-CM | POA: Diagnosis not present

## 2016-06-26 DIAGNOSIS — F17290 Nicotine dependence, other tobacco product, uncomplicated: Secondary | ICD-10-CM | POA: Diagnosis not present

## 2016-06-26 DIAGNOSIS — Z792 Long term (current) use of antibiotics: Secondary | ICD-10-CM | POA: Diagnosis not present

## 2016-06-26 DIAGNOSIS — Z9483 Pancreas transplant status: Secondary | ICD-10-CM | POA: Diagnosis not present

## 2016-06-26 DIAGNOSIS — N739 Female pelvic inflammatory disease, unspecified: Secondary | ICD-10-CM | POA: Diagnosis not present

## 2016-06-26 DIAGNOSIS — N730 Acute parametritis and pelvic cellulitis: Secondary | ICD-10-CM | POA: Diagnosis not present

## 2016-06-26 DIAGNOSIS — Z87448 Personal history of other diseases of urinary system: Secondary | ICD-10-CM | POA: Diagnosis not present

## 2016-06-26 DIAGNOSIS — Z940 Kidney transplant status: Secondary | ICD-10-CM | POA: Diagnosis not present

## 2016-06-26 DIAGNOSIS — D259 Leiomyoma of uterus, unspecified: Secondary | ICD-10-CM | POA: Diagnosis not present

## 2016-06-26 DIAGNOSIS — R1032 Left lower quadrant pain: Secondary | ICD-10-CM | POA: Diagnosis not present

## 2016-06-26 DIAGNOSIS — Z8639 Personal history of other endocrine, nutritional and metabolic disease: Secondary | ICD-10-CM | POA: Diagnosis not present

## 2016-06-26 DIAGNOSIS — Z8659 Personal history of other mental and behavioral disorders: Secondary | ICD-10-CM | POA: Diagnosis not present

## 2016-06-26 DIAGNOSIS — N898 Other specified noninflammatory disorders of vagina: Secondary | ICD-10-CM | POA: Diagnosis not present

## 2016-06-26 DIAGNOSIS — R112 Nausea with vomiting, unspecified: Secondary | ICD-10-CM | POA: Diagnosis not present

## 2016-08-01 DIAGNOSIS — F17290 Nicotine dependence, other tobacco product, uncomplicated: Secondary | ICD-10-CM | POA: Diagnosis not present

## 2016-08-01 DIAGNOSIS — R9431 Abnormal electrocardiogram [ECG] [EKG]: Secondary | ICD-10-CM | POA: Diagnosis not present

## 2016-08-01 DIAGNOSIS — R1032 Left lower quadrant pain: Secondary | ICD-10-CM | POA: Diagnosis not present

## 2016-08-01 DIAGNOSIS — D259 Leiomyoma of uterus, unspecified: Secondary | ICD-10-CM | POA: Diagnosis not present

## 2016-08-01 DIAGNOSIS — H538 Other visual disturbances: Secondary | ICD-10-CM | POA: Diagnosis not present

## 2016-08-01 DIAGNOSIS — R55 Syncope and collapse: Secondary | ICD-10-CM | POA: Diagnosis not present

## 2016-08-01 DIAGNOSIS — R61 Generalized hyperhidrosis: Secondary | ICD-10-CM | POA: Diagnosis not present

## 2016-08-01 DIAGNOSIS — R112 Nausea with vomiting, unspecified: Secondary | ICD-10-CM | POA: Diagnosis not present

## 2016-08-01 DIAGNOSIS — Z940 Kidney transplant status: Secondary | ICD-10-CM | POA: Diagnosis not present

## 2016-08-01 DIAGNOSIS — R42 Dizziness and giddiness: Secondary | ICD-10-CM | POA: Diagnosis not present

## 2016-08-01 DIAGNOSIS — R002 Palpitations: Secondary | ICD-10-CM | POA: Diagnosis not present

## 2016-08-01 DIAGNOSIS — Z9483 Pancreas transplant status: Secondary | ICD-10-CM | POA: Diagnosis not present

## 2016-08-01 DIAGNOSIS — N186 End stage renal disease: Secondary | ICD-10-CM | POA: Diagnosis not present

## 2016-08-01 DIAGNOSIS — E1122 Type 2 diabetes mellitus with diabetic chronic kidney disease: Secondary | ICD-10-CM | POA: Diagnosis not present

## 2016-08-01 DIAGNOSIS — I120 Hypertensive chronic kidney disease with stage 5 chronic kidney disease or end stage renal disease: Secondary | ICD-10-CM | POA: Diagnosis not present

## 2016-08-02 DIAGNOSIS — N888 Other specified noninflammatory disorders of cervix uteri: Secondary | ICD-10-CM | POA: Diagnosis not present

## 2016-08-02 DIAGNOSIS — R1032 Left lower quadrant pain: Secondary | ICD-10-CM | POA: Diagnosis not present

## 2016-08-02 DIAGNOSIS — N831 Corpus luteum cyst of ovary, unspecified side: Secondary | ICD-10-CM | POA: Diagnosis not present

## 2016-08-02 DIAGNOSIS — R55 Syncope and collapse: Secondary | ICD-10-CM | POA: Diagnosis not present

## 2016-08-02 DIAGNOSIS — D259 Leiomyoma of uterus, unspecified: Secondary | ICD-10-CM | POA: Diagnosis not present

## 2016-08-21 ENCOUNTER — Emergency Department (HOSPITAL_COMMUNITY)
Admission: EM | Admit: 2016-08-21 | Discharge: 2016-08-21 | Disposition: A | Discharge status: Home / Self Care | Payer: Medicaid Other | Attending: Emergency Medicine | Admitting: Emergency Medicine

## 2016-08-21 ENCOUNTER — Encounter (HOSPITAL_COMMUNITY): Payer: Self-pay | Admitting: Emergency Medicine

## 2016-08-21 DIAGNOSIS — E1022 Type 1 diabetes mellitus with diabetic chronic kidney disease: Secondary | ICD-10-CM | POA: Insufficient documentation

## 2016-08-21 DIAGNOSIS — S61309A Unspecified open wound of unspecified finger with damage to nail, initial encounter: Secondary | ICD-10-CM

## 2016-08-21 DIAGNOSIS — Y929 Unspecified place or not applicable: Secondary | ICD-10-CM | POA: Insufficient documentation

## 2016-08-21 DIAGNOSIS — Z79899 Other long term (current) drug therapy: Secondary | ICD-10-CM | POA: Diagnosis not present

## 2016-08-21 DIAGNOSIS — Z7982 Long term (current) use of aspirin: Secondary | ICD-10-CM | POA: Insufficient documentation

## 2016-08-21 DIAGNOSIS — IMO0001 Reserved for inherently not codable concepts without codable children: Secondary | ICD-10-CM

## 2016-08-21 DIAGNOSIS — Y9389 Activity, other specified: Secondary | ICD-10-CM | POA: Insufficient documentation

## 2016-08-21 DIAGNOSIS — Y990 Civilian activity done for income or pay: Secondary | ICD-10-CM | POA: Insufficient documentation

## 2016-08-21 DIAGNOSIS — N186 End stage renal disease: Secondary | ICD-10-CM | POA: Diagnosis not present

## 2016-08-21 DIAGNOSIS — W228XXA Striking against or struck by other objects, initial encounter: Secondary | ICD-10-CM | POA: Diagnosis not present

## 2016-08-21 DIAGNOSIS — S61101A Unspecified open wound of right thumb with damage to nail, initial encounter: Secondary | ICD-10-CM | POA: Diagnosis not present

## 2016-08-21 DIAGNOSIS — S61001A Unspecified open wound of right thumb without damage to nail, initial encounter: Secondary | ICD-10-CM | POA: Diagnosis not present

## 2016-08-21 DIAGNOSIS — Z940 Kidney transplant status: Secondary | ICD-10-CM | POA: Diagnosis not present

## 2016-08-21 DIAGNOSIS — I120 Hypertensive chronic kidney disease with stage 5 chronic kidney disease or end stage renal disease: Secondary | ICD-10-CM | POA: Insufficient documentation

## 2016-08-21 DIAGNOSIS — S6991XA Unspecified injury of right wrist, hand and finger(s), initial encounter: Secondary | ICD-10-CM | POA: Diagnosis present

## 2016-08-21 MED ORDER — HYDROMORPHONE HCL 2 MG/ML IJ SOLN
1.0000 mg | Freq: Once | INTRAMUSCULAR | Status: AC
  Administered 2016-08-21: 1 mg via INTRAMUSCULAR
  Filled 2016-08-21: qty 1

## 2016-08-21 MED ORDER — CEPHALEXIN 250 MG PO CAPS: 500.0000 mg | ORAL_CAPSULE | Freq: Once | ORAL | Status: DC

## 2016-08-21 MED ORDER — HYDROCODONE-ACETAMINOPHEN 5-325 MG PO TABS: 2.0000 | ORAL_TABLET | Freq: Once | ORAL | Status: DC

## 2016-08-21 MED ORDER — CEPHALEXIN 500 MG PO CAPS: 500.0000 mg | ORAL_CAPSULE | Freq: Two times a day (BID) | ORAL | 0 refills | Status: DC

## 2016-08-21 MED ORDER — LIDOCAINE HCL (PF) 1 % IJ SOLN
5.0000 mL | Freq: Once | INTRAMUSCULAR | Status: DC
  Filled 2016-08-21: qty 5

## 2016-08-21 NOTE — ED Triage Notes (Signed)
Pt presents to ED with a finger injury. She explained when she was at work she was pulling out chairs when her nail was stuck and partially pulled off. The nail is still attached. Vitals are stable.

## 2016-08-21 NOTE — ED Provider Notes (Signed)
[facility] DEPT Provider Note   CSN: [id number] Arrival date & time: 08/21/16  0418     History   Chief Complaint Chief Complaint  Patient presents with  . Finger Injury    HPI [name] [name] is a 35 y.o. female.  The history is provided by the patient.  Hand Pain  This is a new problem. The current episode started 1 to 2 hours ago. The problem occurs constantly. The problem has been gradually worsening. Exacerbated by: movement. The symptoms are relieved by rest.   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 Anxiety   . Gastroparesis   . Narcotic abuse   . Non compliance with medical treatment   . Renal disorder   . Vision loss     Patient Active Problem List   Diagnosis Date Noted  . End stage renal disease ([place]) 02/06/2012  . Pleural effusion, bilateral 11/16/2011  . Anemia in chronic kidney disease (CKD) 11/16/2011  . Pericardial effusion 11/15/2011  . Fluid overload 11/15/2011  . Abdominal pain, epigastric 11/15/2011  . CKD (chronic kidney disease) stage 5, GFR less than 15 ml/min ([facility]) 11/03/2011  . Hypertension 10/27/2011  . Renal failure, chronic 10/27/2011  . MEDIAL EPICONDYLITIS, LEFT 12/17/2007  . ABSCESS, AXILLA, LEFT 12/03/2007  . DIABETES MELLITUS, TYPE I, UNCONTROLLED 11/05/2007    Past Surgical History:  Procedure Laterality Date  . AV FISTULA PLACEMENT  11/17/2011   Procedure: ARTERIOVENOUS (AV) FISTULA CREATION;  Surgeon: Rosetta Posner, MD;  Location: [facility];  Service: Vascular;  Laterality: Right;  . EYE SURGERY    . KIDNEY TRANSPLANT    . pancrease transplant    . REFRACTIVE SURGERY      OB History    No data available       Home Medications    Prior to Admission medications   Medication  Sig Start Date End Date Taking? Authorizing Provider  aspirin EC 81 MG tablet Take 81 mg by mouth every morning.    Historical Provider, MD  cycloSPORINE modified (NEORAL) 100 MG capsule Take 200 mg by mouth 2 (two) times daily.     Historical Provider, MD  metroNIDAZOLE (FLAGYL) 500 MG tablet Take 1 tablet (500 mg total) by mouth 2 (two) times daily. One po bid x 7 days 04/28/16   [name], MD  mycophenolate (MYFORTIC) 180 MG EC tablet Take 540 mg by mouth 2 (two) times daily.    Historical Provider, MD  oxyCODONE-acetaminophen (PERCOCET) 5-325 MG per tablet Take 1 tablet by mouth every 4 (four) hours as needed. Patient not taking: Reported on 01/12/2015 12/22/14   Noemi Chapel, MD  predniSONE (DELTASONE) 5 MG tablet Take 5 mg by mouth daily.  06/24/15   Historical Provider, MD  promethazine (PHENERGAN) 25 MG suppository Place 1 suppository (25 mg total) rectally every 6 (six) hours as needed for nausea or vomiting. Patient not taking: Reported on 09/07/2014 02/22/14   Evelina Bucy, MD  sodium bicarbonate 650 MG tablet Take 1,950 mg by mouth 3 (three) times daily.     Historical Provider, MD    Family History Family History  Problem Relation Age of Onset  . Hypertension Father     Social History Social History  Substance Use Topics  .  Smoking status: Never Smoker  . Smokeless tobacco: Never Used     Comment: Hooka  . Alcohol use No     Allergies   Fish-derived products and Shrimp [shellfish allergy]   Review of Systems Review of Systems  Constitutional: Negative for fever.  Skin: Positive for wound.     Physical Exam Updated Vital Signs BP 173/77 (BP Location: Left Arm)   Pulse 87   Temp 98.1 F (36.7 C)   Resp 15   Ht 5\' 2"  (1.575 m)   Wt 65.8 kg   SpO2 99%   BMI 26.52 kg/m   Physical Exam CONSTITUTIONAL: Well developed/well nourished, anxious HEAD: Normocephalic/atraumatic ENMT: Mucous membranes moist NECK: supple no meningeal signs CV: S1/S2 noted, no  murmurs/rubs/gallops noted LUNGS: Lungs are clear to auscultation bilaterally, no apparent distress ABDOMEN: soft NEURO: Pt is awake/alert/appropriate, moves all extremitiesx4.  EXTREMITIES: pulses normal/equal, full ROM, partially avulsed thumbnail noted, no active bleeding, no other signs of trauma SKIN: warm, color normal PSYCH: anxious ED Treatments / Results  Labs (all labs ordered are listed, but only abnormal results are displayed) Labs Reviewed - No data to display  EKG  EKG Interpretation None       Radiology No results found.  Procedures .Nerve Block Date/Time: 08/21/2016 7:00 AM Performed by: Ripley Fraise Authorized by: Ripley Fraise   Consent:    Consent obtained:  Verbal   Consent given by:  Patient Indications:    Indications:  Pain relief and procedural anesthesia Location:    Laterality:  Right (left thumb/digital) Procedure details (see MAR for exact dosages):    Block needle gauge:  25 G   Anesthetic injected:  Lidocaine 1% w/o epi Post-procedure details:    Outcome:  Anesthesia achieved   Patient tolerance of procedure:  Tolerated well, no immediate complications .Nail Removal Date/Time: 08/21/2016 7:10 AM Performed by: Ripley Fraise Authorized by: Ripley Fraise   Consent:    Consent obtained:  Verbal   Consent given by:  Patient Location:    Hand:  R thumb Post-procedure details:    Patient tolerance of procedure:  Tolerated well, no immediate complications Comments:     Area was cleansed extensively with betadine Partial avulsion was repaired Nail was placed back in nailbed Bleeding controlled    (including critical care time)  Medications Ordered in ED Medications  lidocaine (PF) (XYLOCAINE) 1 % injection 5 mL (not administered)  HYDROmorphone (DILAUDID) injection 1 mg (1 mg Intramuscular Given 08/21/16 0610)  HYDROmorphone (DILAUDID) injection 1 mg (1 mg Intramuscular Given 08/21/16 0753)     Initial Impression  / Assessment and Plan / ED Course  I have reviewed the triage vital signs and the nursing notes.    Clinical Course     I was able to reposition nail in nailbed Pt tolerated well It did not require suturing Splint applied, referred to hand specialist Will start oral antibiotics Pain is now well controlled   Final Clinical Impressions(s) / ED Diagnoses   Final diagnoses:  Avulsion of nail plate, initial encounter    New Prescriptions New Prescriptions   CEPHALEXIN (KEFLEX) 500 MG CAPSULE    Take 1 capsule (500 mg total) by mouth 2 (two) times daily.     Ripley Fraise, MD 08/21/16 [phone]

## 2016-08-21 NOTE — Progress Notes (Signed)
Orthopedic Tech Progress Note Patient Details:  [name] [name] 1981/04/17 [id number]  Ortho Devices Type of Ortho Device: Finger splint Ortho Device/Splint Location: rue Ortho Device/Splint Interventions: Application   [name] [name] 08/21/2016, 8:16 AM

## 2017-06-27 DIAGNOSIS — R7989 Other specified abnormal findings of blood chemistry: Secondary | ICD-10-CM | POA: Insufficient documentation

## 2017-07-11 DIAGNOSIS — T8611 Kidney transplant rejection: Secondary | ICD-10-CM | POA: Insufficient documentation

## 2017-11-21 ENCOUNTER — Encounter (HOSPITAL_COMMUNITY): Payer: Self-pay | Admitting: Emergency Medicine

## 2017-11-21 ENCOUNTER — Emergency Department (HOSPITAL_COMMUNITY)
Admission: EM | Admit: 2017-11-21 | Discharge: 2017-11-21 | Discharge status: Against Medical Advice | Payer: Medicare Other | Attending: Emergency Medicine | Admitting: Emergency Medicine

## 2017-11-21 DIAGNOSIS — Z5321 Procedure and treatment not carried out due to patient leaving prior to being seen by health care provider: Secondary | ICD-10-CM | POA: Insufficient documentation

## 2017-11-21 DIAGNOSIS — R109 Unspecified abdominal pain: Secondary | ICD-10-CM | POA: Diagnosis present

## 2017-11-21 NOTE — ED Triage Notes (Signed)
Per [facility] pt from home for abd pain, n/v, ear pain, and headache for 4 days. Saw her PCP on Monday and told to take OTC medications but still not feeling any better. Pt has Hx of kidney and pancrease transplant. CBG 110.  Vitals: 145/83

## 2017-11-21 NOTE — ED Notes (Signed)
Pt wasn't in triage room or in lobby when went to take patient back to treatment room

## 2018-02-12 ENCOUNTER — Other Ambulatory Visit (HOSPITAL_COMMUNITY): Payer: Self-pay

## 2018-02-13 ENCOUNTER — Encounter (HOSPITAL_COMMUNITY)
Admission: RE | Admit: 2018-02-13 | Discharge: 2018-02-13 | Disposition: A | Discharge status: Home / Self Care | Payer: Medicare Other | Source: Ambulatory Visit | Attending: Nephrology | Admitting: Nephrology

## 2018-02-13 VITALS — BP 145/66 | HR 86 | Temp 98.7°F | Resp 18

## 2018-02-13 DIAGNOSIS — N184 Chronic kidney disease, stage 4 (severe): Secondary | ICD-10-CM | POA: Diagnosis present

## 2018-02-13 DIAGNOSIS — D631 Anemia in chronic kidney disease: Secondary | ICD-10-CM | POA: Diagnosis present

## 2018-02-13 LAB — POCT HEMOGLOBIN-HEMACUE: Hemoglobin: 9 g/dL — ABNORMAL LOW (ref 12.0–15.0)

## 2018-02-13 MED ORDER — EPOETIN ALFA-EPBX 10000 UNIT/ML IJ SOLN
10000.0000 [IU] | INTRAMUSCULAR | Status: DC
  Administered 2018-02-13: 10000 [IU] via SUBCUTANEOUS
  Filled 2018-02-13: qty 1

## 2018-02-13 NOTE — Discharge Instructions (Signed)
Epoetin Alfa injection °What is this medicine? °EPOETIN ALFA (e POE e tin AL fa) helps your body make more red blood cells. This medicine is used to treat anemia caused by chronic kidney failure, cancer chemotherapy, or HIV-therapy. It may also be used before surgery if you have anemia. °This medicine may be used for other purposes; ask your health care provider or pharmacist if you have questions. °COMMON BRAND NAME(S): Epogen, Procrit °What should I tell my health care provider before I take this medicine? °They need to know if you have any of these conditions: °-blood clotting disorders °-cancer patient not on chemotherapy °-cystic fibrosis °-heart disease, such as angina or heart failure °-hemoglobin level of 12 g/dL or greater °-high blood pressure °-low levels of folate, iron, or vitamin B12 °-seizures °-an unusual or allergic reaction to erythropoietin, albumin, benzyl alcohol, hamster proteins, other medicines, foods, dyes, or preservatives °-pregnant or trying to get pregnant °-breast-feeding °How should I use this medicine? °This medicine is for injection into a vein or under the skin. It is usually given by a health care professional in a hospital or clinic setting. °If you get this medicine at home, you will be taught how to prepare and give this medicine. Use exactly as directed. Take your medicine at regular intervals. Do not take your medicine more often than directed. °It is important that you put your used needles and syringes in a special sharps container. Do not put them in a trash can. If you do not have a sharps container, call your pharmacist or healthcare provider to get one. °A special MedGuide will be given to you by the pharmacist with each prescription and refill. Be sure to read this information carefully each time. °Talk to your pediatrician regarding the use of this medicine in children. While this drug may be prescribed for selected conditions, precautions do apply. °Overdosage: If you  think you have taken too much of this medicine contact a poison control center or emergency room at once. °NOTE: This medicine is only for you. Do not share this medicine with others. °What if I miss a dose? °If you miss a dose, take it as soon as you can. If it is almost time for your next dose, take only that dose. Do not take double or extra doses. °What may interact with this medicine? °Do not take this medicine with any of the following medications: °-darbepoetin alfa °This list may not describe all possible interactions. Give your health care provider a list of all the medicines, herbs, non-prescription drugs, or dietary supplements you use. Also tell them if you smoke, drink alcohol, or use illegal drugs. Some items may interact with your medicine. °What should I watch for while using this medicine? °Your condition will be monitored carefully while you are receiving this medicine. °You may need blood work done while you are taking this medicine. °What side effects may I notice from receiving this medicine? °Side effects that you should report to your doctor or health care professional as soon as possible: °-allergic reactions like skin rash, itching or hives, swelling of the face, lips, or tongue °-breathing problems °-changes in vision °-chest pain °-confusion, trouble speaking or understanding °-feeling faint or lightheaded, falls °-high blood pressure °-muscle aches or pains °-pain, swelling, warmth in the leg °-rapid weight gain °-severe headaches °-sudden numbness or weakness of the face, arm or leg °-trouble walking, dizziness, loss of balance or coordination °-seizures (convulsions) °-swelling of the ankles, feet, hands °-unusually weak or tired °  Side effects that usually do not require medical attention (report to your doctor or health care professional if they continue or are bothersome): °-diarrhea °-fever, chills (flu-like symptoms) °-headaches °-nausea, vomiting °-redness, stinging, or swelling at  site where injected °This list may not describe all possible side effects. Call your doctor for medical advice about side effects. You may report side effects to FDA at 1-800-FDA-1088. °Where should I keep my medicine? °Keep out of the reach of children. °Store in a refrigerator between 2 and 8 degrees C (36 and 46 degrees F). Do not freeze or shake. Throw away any unused portion if using a single-dose vial. Multi-dose vials can be kept in the refrigerator for up to 21 days after the initial dose. Throw away unused medicine. °NOTE: This sheet is a summary. It may not cover all possible information. If you have questions about this medicine, talk to your doctor, pharmacist, or health care provider. °© 2018 [employer] (2016-04-10 19:42:31) ° °

## 2018-02-27 ENCOUNTER — Inpatient Hospital Stay (HOSPITAL_COMMUNITY): Admission: RE | Admit: 2018-02-27 | Payer: Medicare Other | Source: Ambulatory Visit

## 2018-03-13 ENCOUNTER — Encounter (HOSPITAL_COMMUNITY): Payer: Medicare Other

## 2018-04-03 ENCOUNTER — Inpatient Hospital Stay (HOSPITAL_COMMUNITY)
Admission: RE | Admit: 2018-04-03 | Discharge: 2018-04-03 | Disposition: A | Discharge status: Home / Self Care | Payer: Medicare Other | Source: Ambulatory Visit | Attending: Nephrology | Admitting: Nephrology

## 2018-04-06 DIAGNOSIS — D509 Iron deficiency anemia, unspecified: Secondary | ICD-10-CM | POA: Insufficient documentation

## 2018-04-06 DIAGNOSIS — D259 Leiomyoma of uterus, unspecified: Secondary | ICD-10-CM | POA: Insufficient documentation

## 2018-04-06 DIAGNOSIS — Z111 Encounter for screening for respiratory tuberculosis: Secondary | ICD-10-CM | POA: Insufficient documentation

## 2018-04-06 DIAGNOSIS — N2581 Secondary hyperparathyroidism of renal origin: Secondary | ICD-10-CM | POA: Insufficient documentation

## 2018-04-06 DIAGNOSIS — M109 Gout, unspecified: Secondary | ICD-10-CM | POA: Insufficient documentation

## 2018-04-17 ENCOUNTER — Encounter (HOSPITAL_COMMUNITY): Payer: Medicare Other

## 2018-04-18 DIAGNOSIS — E441 Mild protein-calorie malnutrition: Secondary | ICD-10-CM | POA: Insufficient documentation

## 2018-04-18 DIAGNOSIS — E440 Moderate protein-calorie malnutrition: Secondary | ICD-10-CM | POA: Insufficient documentation

## 2018-08-16 DIAGNOSIS — B957 Other staphylococcus as the cause of diseases classified elsewhere: Secondary | ICD-10-CM | POA: Insufficient documentation

## 2018-09-05 DIAGNOSIS — N186 End stage renal disease: Secondary | ICD-10-CM | POA: Diagnosis not present

## 2018-09-05 DIAGNOSIS — E1129 Type 2 diabetes mellitus with other diabetic kidney complication: Secondary | ICD-10-CM | POA: Diagnosis not present

## 2018-09-05 DIAGNOSIS — D631 Anemia in chronic kidney disease: Secondary | ICD-10-CM | POA: Diagnosis not present

## 2018-09-05 DIAGNOSIS — D509 Iron deficiency anemia, unspecified: Secondary | ICD-10-CM | POA: Diagnosis not present

## 2018-09-05 DIAGNOSIS — Z23 Encounter for immunization: Secondary | ICD-10-CM | POA: Diagnosis not present

## 2018-09-05 DIAGNOSIS — N2581 Secondary hyperparathyroidism of renal origin: Secondary | ICD-10-CM | POA: Diagnosis not present

## 2018-09-18 DIAGNOSIS — E1129 Type 2 diabetes mellitus with other diabetic kidney complication: Secondary | ICD-10-CM | POA: Diagnosis not present

## 2018-09-20 DIAGNOSIS — E103593 Type 1 diabetes mellitus with proliferative diabetic retinopathy without macular edema, bilateral: Secondary | ICD-10-CM | POA: Diagnosis not present

## 2018-09-20 DIAGNOSIS — H26492 Other secondary cataract, left eye: Secondary | ICD-10-CM | POA: Diagnosis not present

## 2018-09-20 DIAGNOSIS — H4312 Vitreous hemorrhage, left eye: Secondary | ICD-10-CM | POA: Diagnosis not present

## 2018-09-30 DIAGNOSIS — H4312 Vitreous hemorrhage, left eye: Secondary | ICD-10-CM | POA: Diagnosis not present

## 2018-09-30 DIAGNOSIS — H26492 Other secondary cataract, left eye: Secondary | ICD-10-CM | POA: Diagnosis not present

## 2018-10-01 DIAGNOSIS — H4312 Vitreous hemorrhage, left eye: Secondary | ICD-10-CM | POA: Diagnosis not present

## 2018-10-01 DIAGNOSIS — Z48810 Encounter for surgical aftercare following surgery on the sense organs: Secondary | ICD-10-CM | POA: Diagnosis not present

## 2018-10-01 DIAGNOSIS — H26492 Other secondary cataract, left eye: Secondary | ICD-10-CM | POA: Diagnosis not present

## 2018-10-01 DIAGNOSIS — E103593 Type 1 diabetes mellitus with proliferative diabetic retinopathy without macular edema, bilateral: Secondary | ICD-10-CM | POA: Diagnosis not present

## 2018-10-04 DIAGNOSIS — Z992 Dependence on renal dialysis: Secondary | ICD-10-CM | POA: Diagnosis not present

## 2018-10-04 DIAGNOSIS — N189 Chronic kidney disease, unspecified: Secondary | ICD-10-CM | POA: Diagnosis not present

## 2018-10-04 DIAGNOSIS — T8610 Unspecified complication of kidney transplant: Secondary | ICD-10-CM | POA: Diagnosis not present

## 2018-10-07 DIAGNOSIS — N2581 Secondary hyperparathyroidism of renal origin: Secondary | ICD-10-CM | POA: Diagnosis not present

## 2018-10-07 DIAGNOSIS — E1129 Type 2 diabetes mellitus with other diabetic kidney complication: Secondary | ICD-10-CM | POA: Diagnosis not present

## 2018-10-07 DIAGNOSIS — N186 End stage renal disease: Secondary | ICD-10-CM | POA: Diagnosis not present

## 2018-10-07 DIAGNOSIS — D509 Iron deficiency anemia, unspecified: Secondary | ICD-10-CM | POA: Diagnosis not present

## 2018-10-07 DIAGNOSIS — D631 Anemia in chronic kidney disease: Secondary | ICD-10-CM | POA: Diagnosis not present

## 2018-11-02 DIAGNOSIS — Z992 Dependence on renal dialysis: Secondary | ICD-10-CM | POA: Diagnosis not present

## 2018-11-02 DIAGNOSIS — N189 Chronic kidney disease, unspecified: Secondary | ICD-10-CM | POA: Diagnosis not present

## 2018-11-02 DIAGNOSIS — T8610 Unspecified complication of kidney transplant: Secondary | ICD-10-CM | POA: Diagnosis not present

## 2018-11-04 ENCOUNTER — Emergency Department (HOSPITAL_COMMUNITY)
Admission: EM | Admit: 2018-11-04 | Discharge: 2018-11-04 | Disposition: A | Discharge status: Home / Self Care | Payer: Medicare Other | Attending: Emergency Medicine | Admitting: Emergency Medicine

## 2018-11-04 ENCOUNTER — Emergency Department (HOSPITAL_COMMUNITY): Payer: Medicare Other

## 2018-11-04 ENCOUNTER — Other Ambulatory Visit: Payer: Self-pay

## 2018-11-04 ENCOUNTER — Encounter (HOSPITAL_COMMUNITY): Payer: Self-pay | Admitting: Emergency Medicine

## 2018-11-04 DIAGNOSIS — Z7982 Long term (current) use of aspirin: Secondary | ICD-10-CM | POA: Insufficient documentation

## 2018-11-04 DIAGNOSIS — R1032 Left lower quadrant pain: Secondary | ICD-10-CM | POA: Diagnosis not present

## 2018-11-04 DIAGNOSIS — Z79899 Other long term (current) drug therapy: Secondary | ICD-10-CM | POA: Insufficient documentation

## 2018-11-04 DIAGNOSIS — R519 Headache, unspecified: Secondary | ICD-10-CM

## 2018-11-04 DIAGNOSIS — R51 Headache: Secondary | ICD-10-CM | POA: Diagnosis not present

## 2018-11-04 DIAGNOSIS — R1030 Lower abdominal pain, unspecified: Secondary | ICD-10-CM

## 2018-11-04 DIAGNOSIS — E119 Type 2 diabetes mellitus without complications: Secondary | ICD-10-CM | POA: Diagnosis not present

## 2018-11-04 DIAGNOSIS — B9689 Other specified bacterial agents as the cause of diseases classified elsewhere: Secondary | ICD-10-CM

## 2018-11-04 DIAGNOSIS — N760 Acute vaginitis: Secondary | ICD-10-CM | POA: Insufficient documentation

## 2018-11-04 DIAGNOSIS — I120 Hypertensive chronic kidney disease with stage 5 chronic kidney disease or end stage renal disease: Secondary | ICD-10-CM | POA: Insufficient documentation

## 2018-11-04 DIAGNOSIS — N186 End stage renal disease: Secondary | ICD-10-CM | POA: Diagnosis not present

## 2018-11-04 LAB — RPR: RPR: NONREACTIVE

## 2018-11-04 LAB — CBC WITH DIFFERENTIAL/PLATELET
ABS IMMATURE GRANULOCYTES: 0.03 10*3/uL (ref 0.00–0.07)
BASOS PCT: 0 %
Basophils Absolute: 0 10*3/uL (ref 0.0–0.1)
EOS ABS: 0 10*3/uL (ref 0.0–0.5)
EOS PCT: 1 %
HEMATOCRIT: 31.2 % — AB (ref 36.0–46.0)
Hemoglobin: 9.4 g/dL — ABNORMAL LOW (ref 12.0–15.0)
Immature Granulocytes: 1 %
Lymphocytes Relative: 12 %
Lymphs Abs: 0.5 10*3/uL — ABNORMAL LOW (ref 0.7–4.0)
MCH: 27.6 pg (ref 26.0–34.0)
MCHC: 30.1 g/dL (ref 30.0–36.0)
MCV: 91.8 fL (ref 80.0–100.0)
MONO ABS: 0.2 10*3/uL (ref 0.1–1.0)
Monocytes Relative: 5 %
NEUTROS ABS: 3.8 10*3/uL (ref 1.7–7.7)
Neutrophils Relative %: 81 %
Platelets: 166 10*3/uL (ref 150–400)
RBC: 3.4 MIL/uL — AB (ref 3.87–5.11)
RDW: 13.1 % (ref 11.5–15.5)
WBC: 4.7 10*3/uL (ref 4.0–10.5)
nRBC: 0 % (ref 0.0–0.2)

## 2018-11-04 LAB — LIPASE, BLOOD: LIPASE: 31 U/L (ref 11–51)

## 2018-11-04 LAB — GC/CHLAMYDIA PROBE AMP (~~LOC~~) NOT AT ARMC
CHLAMYDIA, DNA PROBE: NEGATIVE
Neisseria Gonorrhea: NEGATIVE

## 2018-11-04 LAB — COMPREHENSIVE METABOLIC PANEL
ALT: 14 U/L (ref 0–44)
AST: 12 U/L — ABNORMAL LOW (ref 15–41)
Albumin: 3.4 g/dL — ABNORMAL LOW (ref 3.5–5.0)
Alkaline Phosphatase: 58 U/L (ref 38–126)
Anion gap: 12 (ref 5–15)
BILIRUBIN TOTAL: 0.5 mg/dL (ref 0.3–1.2)
BUN: 44 mg/dL — ABNORMAL HIGH (ref 6–20)
CHLORIDE: 106 mmol/L (ref 98–111)
CO2: 22 mmol/L (ref 22–32)
CREATININE: 8.77 mg/dL — AB (ref 0.44–1.00)
Calcium: 8.1 mg/dL — ABNORMAL LOW (ref 8.9–10.3)
GFR, EST AFRICAN AMERICAN: 6 mL/min — AB (ref >60)
GFR, EST NON AFRICAN AMERICAN: 5 mL/min — AB (ref >60)
Glucose, Bld: 143 mg/dL — ABNORMAL HIGH (ref 70–99)
Potassium: 4.4 mmol/L (ref 3.5–5.1)
Sodium: 140 mmol/L (ref 135–145)
TOTAL PROTEIN: 6.2 g/dL — AB (ref 6.5–8.1)

## 2018-11-04 LAB — HIV ANTIBODY (ROUTINE TESTING W REFLEX): HIV SCREEN 4TH GENERATION: NONREACTIVE

## 2018-11-04 LAB — URINALYSIS, ROUTINE W REFLEX MICROSCOPIC
BILIRUBIN URINE: NEGATIVE
Bacteria, UA: NONE SEEN
Glucose, UA: 50 mg/dL — AB
Hgb urine dipstick: NEGATIVE
Ketones, ur: NEGATIVE mg/dL
LEUKOCYTE UA: NEGATIVE
NITRITE: NEGATIVE
PH: 9 — AB (ref 5.0–8.0)
PROTEIN: 100 mg/dL — AB
Specific Gravity, Urine: 1.011 (ref 1.005–1.030)

## 2018-11-04 LAB — HCG, QUANTITATIVE, PREGNANCY: HCG, BETA CHAIN, QUANT, S: 1 m[IU]/mL (ref <5)

## 2018-11-04 LAB — I-STAT BETA HCG BLOOD, ED (MC, WL, AP ONLY): HCG, QUANTITATIVE: 7 m[IU]/mL — AB (ref <5)

## 2018-11-04 LAB — WET PREP, GENITAL
SPERM: NONE SEEN
TRICH WET PREP: NONE SEEN
Yeast Wet Prep HPF POC: NONE SEEN

## 2018-11-04 IMAGING — CT CT HEAD W/O CM
3 series · 16 of 47 positions shown, 19 images · non-contrast
Comparison: Head CT [DATE]

CLINICAL DATA: Headache, acute, severe, worst HA of life

EXAM:
CT HEAD WITHOUT CONTRAST
TECHNIQUE: Contiguous axial images were obtained from the base of the skull
through the vertex without intravenous contrast.

[Series 3: head 5.0 h30s · axial · 0.41mm/px · z∈[-145,-10]mm · 10 of 33 slices shown, 13 images]
[im 3/33  brain]
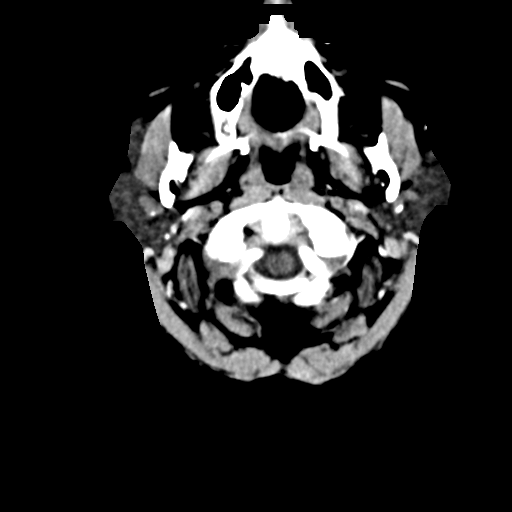
[im 3/33  bone]
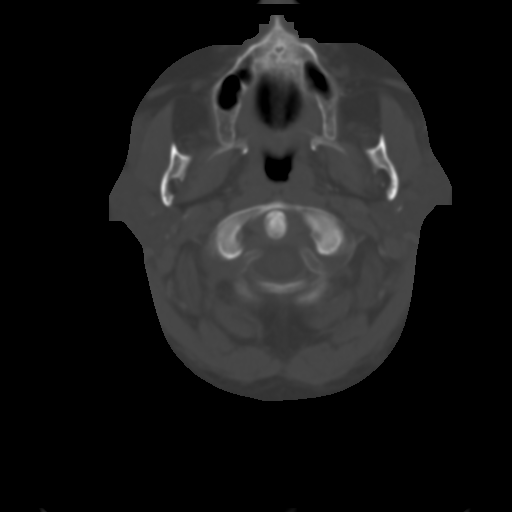
[im 6/33  brain]
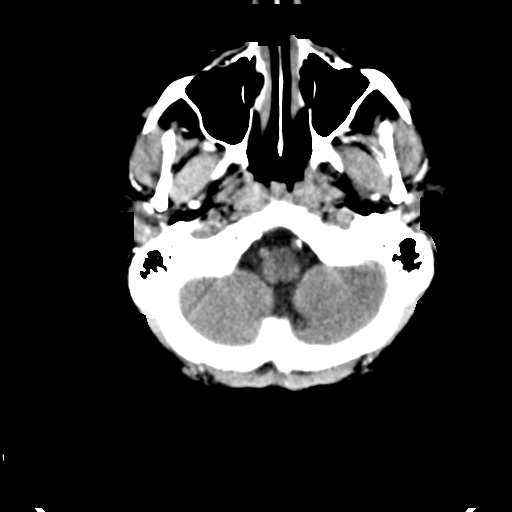
[im 9/33  brain]
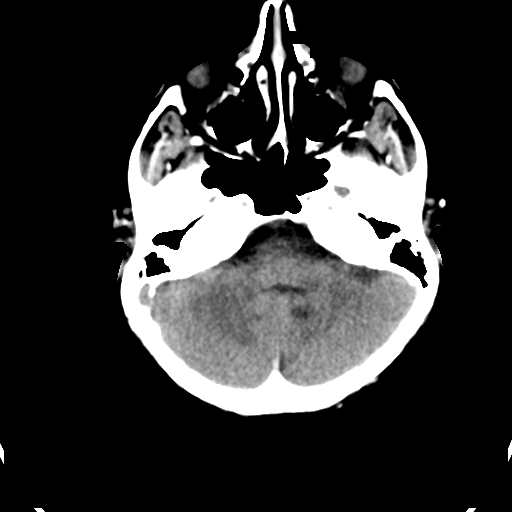
[im 12/33  brain]
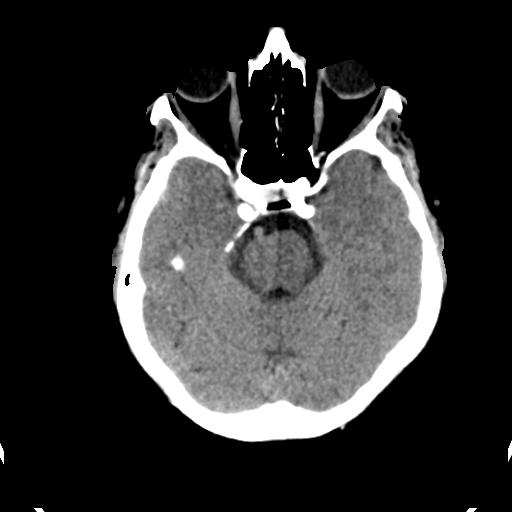
[im 15/33  brain]
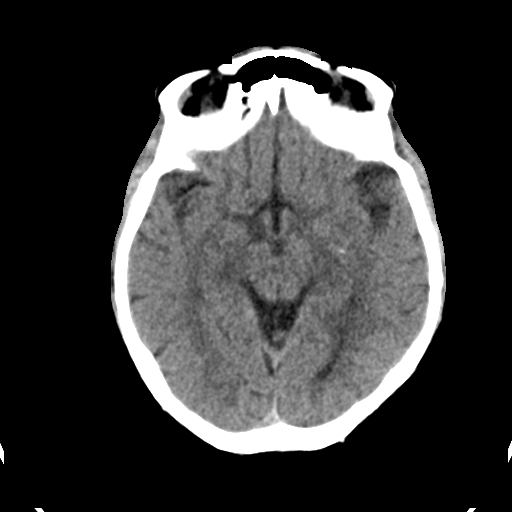
[im 15/33  bone]
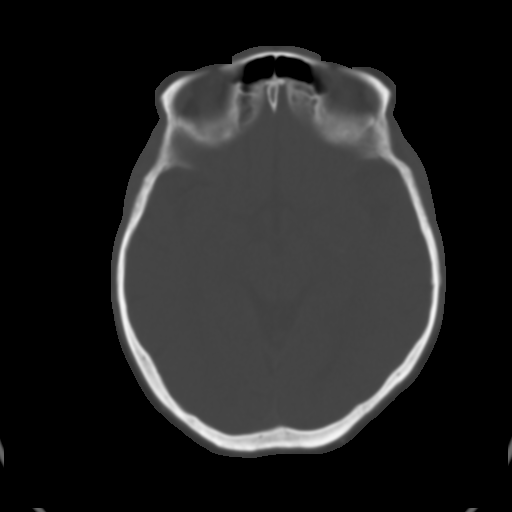
[im 18/33  brain]
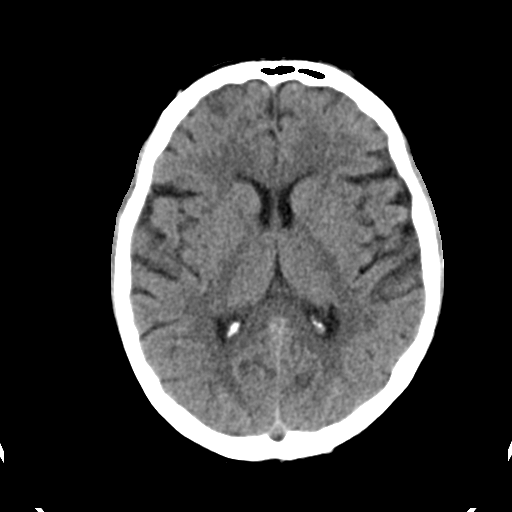
[im 21/33  brain]
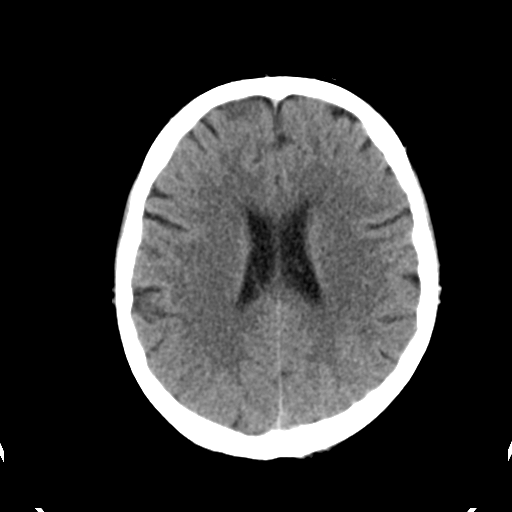
[im 25/33  brain]
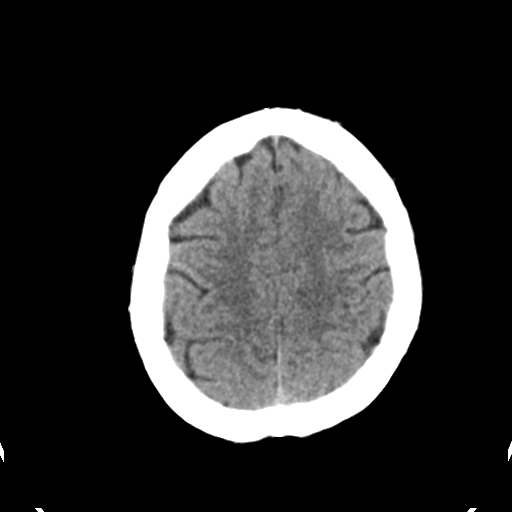
[im 27/33  brain]
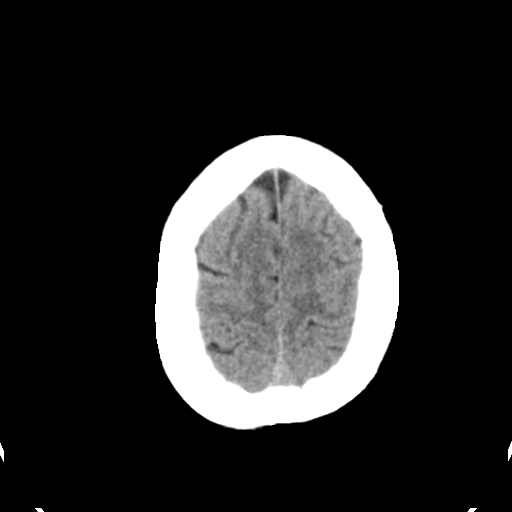
[im 27/33  bone]
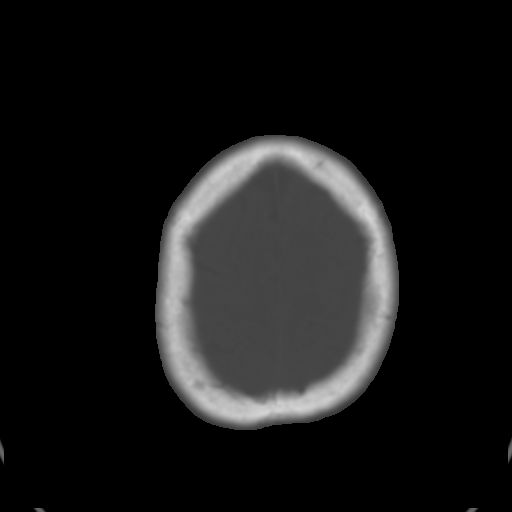
[im 30/33  brain]
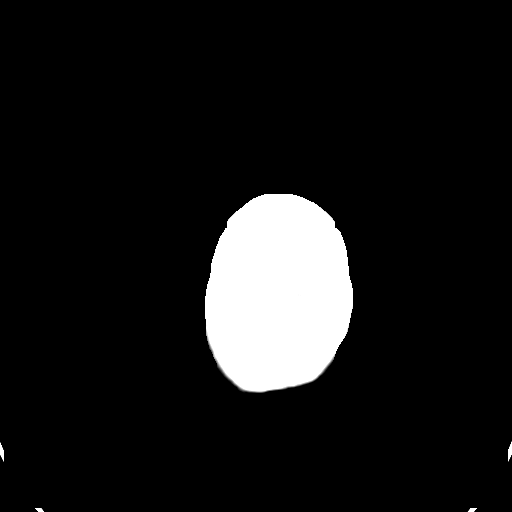

[Series 5: head 3.0 mpr cor · coronal · 0.33mm/px · 3 of 67 slices shown]
[im 23/67  brain]
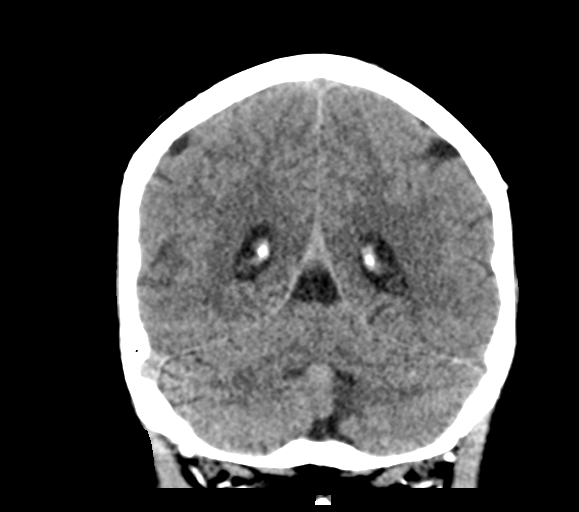
[im 30/67  brain]
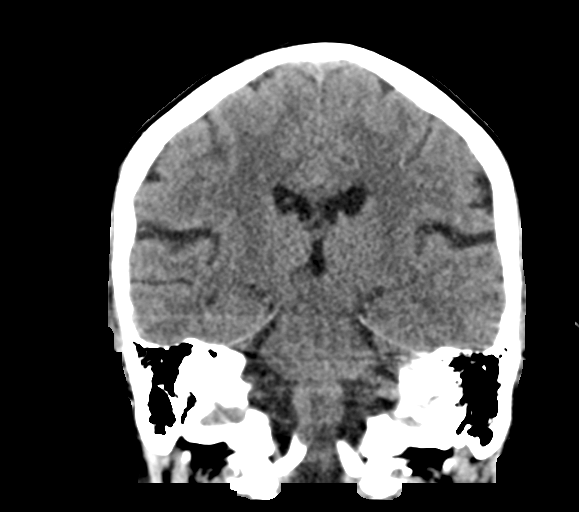
[im 37/67  brain]
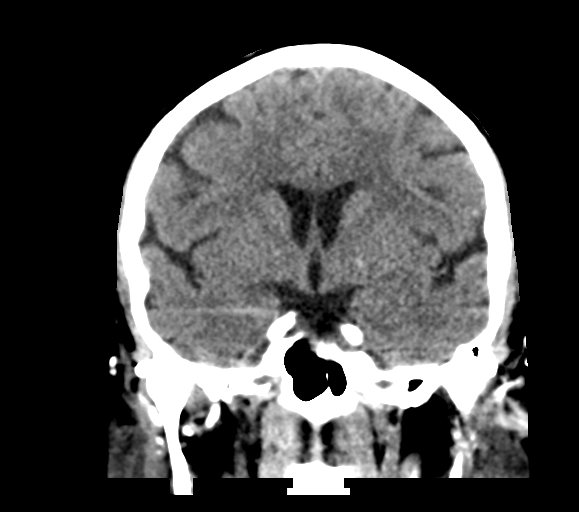

[Series 6: head 3.0 mpr sag · sagittal · 0.33mm/px · 3 of 59 slices shown]
[im 20/59  brain]
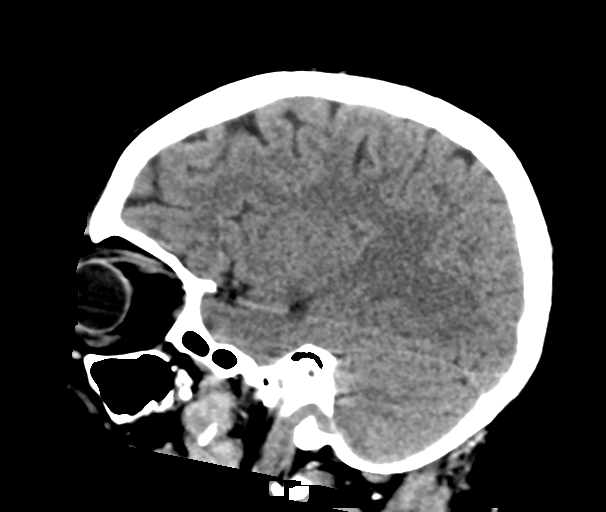
[im 30/59  brain]
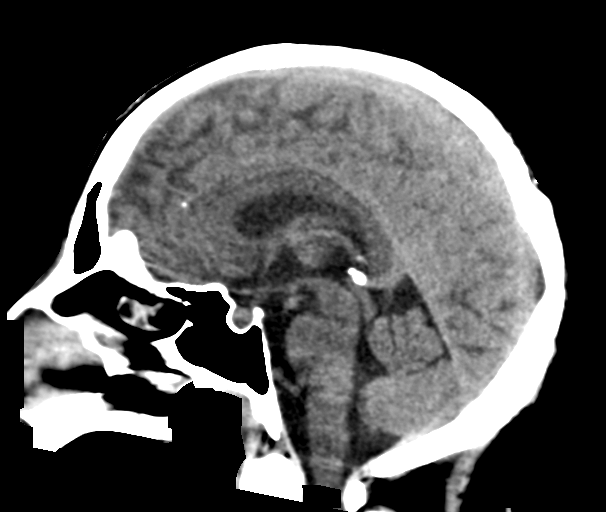
[im 39/59  brain]
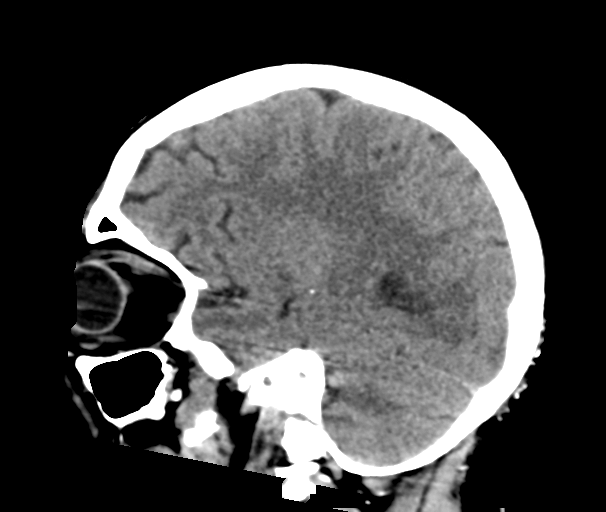

[16 of 47 positions shown; findings below may reference images not displayed]

FINDINGS: Brain: No intracranial hemorrhage, mass effect, or midline shift. No
hydrocephalus. The basilar cisterns are patent. No evidence of
territorial infarct or acute ischemia. Suspect remote inferior left
cerebellar infarct. No extra-axial or intracranial fluid collection.

Vascular: Dense vascular calcification of skull base, age advanced.
No hyperdense vessel.

Skull: No fracture or focal lesion.

Sinuses/Orbits: Bilateral lens extraction. Mild opacification of
lower right mastoid air cells.

Other: None.
IMPRESSION: 1. No acute intracranial abnormality.
2. Age advanced vascular calcifications at the skull base. Probable
remote inferior left cerebellar infarct.

## 2018-11-04 MED ORDER — SODIUM CHLORIDE 0.9 % IV BOLUS
1000.0000 mL | Freq: Once | INTRAVENOUS | Status: AC
  Administered 2018-11-04: 1000 mL via INTRAVENOUS

## 2018-11-04 MED ORDER — KETAMINE HCL 10 MG/ML IJ SOLN: 0.1500 mg/kg | Freq: Once | INTRAMUSCULAR | Status: DC

## 2018-11-04 MED ORDER — DIPHENHYDRAMINE HCL 50 MG/ML IJ SOLN
25.0000 mg | Freq: Once | INTRAMUSCULAR | Status: AC
  Administered 2018-11-04: 25 mg via INTRAVENOUS
  Filled 2018-11-04: qty 1

## 2018-11-04 MED ORDER — PROCHLORPERAZINE EDISYLATE 10 MG/2ML IJ SOLN
10.0000 mg | Freq: Once | INTRAMUSCULAR | Status: AC
  Administered 2018-11-04: 10 mg via INTRAVENOUS
  Filled 2018-11-04: qty 2

## 2018-11-04 MED ORDER — METRONIDAZOLE 500 MG PO TABS: 500.0000 mg | ORAL_TABLET | Freq: Two times a day (BID) | ORAL | 0 refills | Status: DC

## 2018-11-04 MED ORDER — KETAMINE HCL 50 MG/5ML IJ SOSY
10.0000 mg | PREFILLED_SYRINGE | Freq: Once | INTRAMUSCULAR | Status: AC
  Administered 2018-11-04: 10 mg via INTRAVENOUS
  Filled 2018-11-04: qty 5

## 2018-11-04 NOTE — ED Notes (Signed)
Nurse drawing labs. 

## 2018-11-04 NOTE — ED Triage Notes (Addendum)
Pt brought to ED by EMS from home. Patient c/o n/v since 10/28/2018. Patient vomited x3 today. Abdominal pain in LLQ that radiates towards her back. Abdominal pain is a 10/10 that is sharp. States pain began during dialysis on Monday. Pt c/o headache all over and light headedness also started Monday. Pt states headache gets worse while on dialysis machine. headache is a 10/10. Pt received all dialysis treatments this week. Hx of kidney and pancrease transplant, UTI, ovarian cysts.

## 2018-11-04 NOTE — ED Notes (Signed)
Patient discharged from facility. Patient verbalized understanding of discharge instructions. Patient ambulating well out of facility.

## 2018-11-04 NOTE — ED Provider Notes (Signed)
[facility] EMERGENCY DEPARTMENT Provider Note   CSN: [id number] Arrival date & time: 11/04/18  0023    History   Chief Complaint Chief Complaint  Patient presents with  . Nausea  . Headache    HPI [name] [name] is a 38 y.o. female.     [name] [name] is a 38 y.o. female with a history of prior renal and pancreatic transplant, gastroparesis and chronic abdominal pain, ESRD on hemodialysis, chronic anemia, diabetes, hypertension, who presents to the emergency department for evaluation of headache and lower abdominal pain.  Reports pain started Monday and has been constant since then, she reports abdominal pain and headache started similarly.  She reports severe pain across the top of her head with no associated vision changes, facial asymmetry, numbness, weakness or tingling. She reports pain across her lower abdomen that is a constant ache, but that she gets waves of sharp and worsening pain. She reports associated nausea and vomiting. No diarrhea, constipation, melena or hematochezia. Pt does still make urine while on dialysis, she denies and dysuria or urinary frequency. No vaginal discharge or bleeding. No thing for pin prior to arrival. Pt reports she has not missed any dialysis this week, and has been working through the pain. She had not missed any doses or her cyclosporine or myfortic, and has been taking her HTN and diabetes meds regularly. Chart review reveal history of many similar presentations for lower abdominal pain and pt has been diagnosed with chronic abdominal pain and a history of narcotic abuse. Headaches are not typical for the patient , though, pt reports she does get headaches occasionally durign dialysis.     Past Medical History:  Diagnosis Date  . Anxiety   . Gastroparesis   . Narcotic abuse ([place])   . Non compliance with medical treatment   . Renal disorder   . Vision loss     Patient Active Problem List   Diagnosis Date Noted  . End stage  renal disease ([place]) 02/06/2012  . Pleural effusion, bilateral 11/16/2011  . Anemia in chronic kidney disease (CKD) 11/16/2011  . Pericardial effusion 11/15/2011  . Fluid overload 11/15/2011  . Abdominal pain, epigastric 11/15/2011  . CKD (chronic kidney disease) stage 5, GFR less than 15 ml/min (HCC) 11/03/2011  . Hypertension 10/27/2011  . Renal failure, chronic 10/27/2011  . MEDIAL EPICONDYLITIS, LEFT 12/17/2007  . ABSCESS, AXILLA, LEFT 12/03/2007  . DIABETES MELLITUS, TYPE I, UNCONTROLLED 11/05/2007    Past Surgical History:  Procedure Laterality Date  . AV FISTULA PLACEMENT  11/17/2011   Procedure: ARTERIOVENOUS (AV) FISTULA CREATION;  Surgeon: Rosetta Posner, MD;  Location: [facility];  Service: Vascular;  Laterality: Right;  . EYE SURGERY    . KIDNEY TRANSPLANT    . NEPHRECTOMY TRANSPLANTED ORGAN    . pancrease transplant    . REFRACTIVE SURGERY       OB History   No obstetric history on file.      Home Medications    Prior to Admission medications   Medication Sig Start Date End Date Taking? Authorizing Provider  amLODipine (NORVASC) 10 MG tablet Take 10 mg by mouth daily. 09/19/18  Yes [provider]  aspirin EC 81 MG tablet Take 81 mg by mouth every morning.   Yes [provider]  cycloSPORINE modified (NEORAL) 100 MG capsule Take 200 mg by mouth 2 (two) times daily.    Yes [provider]  mycophenolate (MYFORTIC) 180 MG EC tablet Take 540 mg by  mouth 2 (two) times daily.   Yes [provider]  predniSONE (DELTASONE) 5 MG tablet Take 5 mg by mouth daily.  06/24/15  Yes [provider]  cephALEXin (KEFLEX) 500 MG capsule Take 1 capsule (500 mg total) by mouth 2 (two) times daily. Patient not taking: Reported on 11/04/2018 08/21/16   Ripley Fraise, MD  metroNIDAZOLE (FLAGYL) 500 MG tablet Take 1 tablet (500 mg total) by mouth 2 (two) times daily. One po bid x 7 days 11/04/18   Jacqlyn Larsen, PA-C  oxyCODONE-acetaminophen  (PERCOCET) 5-325 MG per tablet Take 1 tablet by mouth every 4 (four) hours as needed. Patient not taking: Reported on 01/12/2015 12/22/14   Noemi Chapel, MD  promethazine (PHENERGAN) 25 MG suppository Place 1 suppository (25 mg total) rectally every 6 (six) hours as needed for nausea or vomiting. Patient not taking: Reported on 09/07/2014 02/22/14   Evelina Bucy, MD    Family History Family History  Problem Relation Age of Onset  . Hypertension Father     Social History Social History   Tobacco Use  . Smoking status: Never Smoker  . Smokeless tobacco: Never Used  . Tobacco comment: [place]  Substance Use Topics  . Alcohol use: No  . Drug use: No     Allergies   Fish-derived products and Shrimp [shellfish allergy]   Review of Systems Review of Systems  Constitutional: Negative for chills and fever.  HENT: Negative.   Eyes: Negative for visual disturbance.  Respiratory: Negative for cough and shortness of breath.   Cardiovascular: Negative for chest pain, palpitations and leg swelling.  Gastrointestinal: Positive for abdominal pain, nausea and vomiting. Negative for constipation and diarrhea.  Genitourinary: Negative for dysuria, frequency and hematuria.  Musculoskeletal: Negative for arthralgias and myalgias.  Skin: Negative for color change and rash.  Neurological: Positive for headaches. Negative for dizziness, syncope, facial asymmetry, speech difficulty, weakness, light-headedness and numbness.     Physical Exam Updated Vital Signs BP (!) 159/81 (BP Location: Left Arm)   Pulse (!) 109   Temp 98.3 F (36.8 C) (Oral)   Resp (!) 33   Ht 5\' 2"  (1.575 m)   SpO2 98%   BMI 26.52 kg/m   Physical Exam Vitals signs and nursing note reviewed. Exam conducted with a chaperone present.  Constitutional:      General: She is not in acute distress.    Appearance: She is well-developed and normal weight. She is not ill-appearing or diaphoretic.  HENT:     Head: Normocephalic  and atraumatic.  Eyes:     General:        Right eye: No discharge.        Left eye: No discharge.     Pupils: Pupils are equal, round, and reactive to light.  Neck:     Musculoskeletal: Normal range of motion and neck supple.  Cardiovascular:     Rate and Rhythm: Normal rate and regular rhythm.     Heart sounds: Normal heart sounds.  Pulmonary:     Effort: Pulmonary effort is normal. No respiratory distress.     Breath sounds: Normal breath sounds. No wheezing or rales.     Comments: Respirations equal and unlabored, patient able to speak in full sentences, lungs clear to auscultation bilaterally Abdominal:     General: Bowel sounds are normal. There is no distension.     Palpations: Abdomen is soft. There is no mass.     Tenderness: There is abdominal tenderness. There is  no guarding.     Comments: Abdomen soft and nondistended, bowel sounds present throughout, patient has tenderness across the lower abdomen most notable in the left lower quadrant but there is no guarding, patient does not have CVA tenderness bilaterally, no peritoneal signs.  Genitourinary:    Comments: Chaperone present during pelvic exam. No external genital lesions noted. Speculum exam reveals a small amount of white discharge present in the vaginal vault, no cervical lesions noted. Bimanual exam reveals no cervical motion tenderness or focal adnexal tenderness or masses. Musculoskeletal:        General: No deformity.  Skin:    General: Skin is warm and dry.     Capillary Refill: Capillary refill takes less than 2 seconds.  Neurological:     Mental Status: She is alert and oriented to person, place, and time.     GCS: GCS eye subscore is 4. GCS verbal subscore is 5. GCS motor subscore is 6.     Coordination: Coordination normal.     Comments: Speech is clear, able to follow commands CN III-XII intact Normal strength in upper and lower extremities bilaterally including dorsiflexion and plantar flexion, strong  and equal grip strength Sensation normal to light and sharp touch Moves extremities without ataxia, coordination intact Normal finger to nose and rapid alternating movements No pronator drift  Psychiatric:        Mood and Affect: Mood normal.        Behavior: Behavior normal.      ED Treatments / Results  Labs (all labs ordered are listed, but only abnormal results are displayed) Labs Reviewed  WET PREP, GENITAL - Abnormal; Notable for the following components:      Result Value   Clue Cells Wet Prep HPF POC PRESENT (*)    WBC, Wet Prep HPF POC FEW (*)    All other components within normal limits  COMPREHENSIVE METABOLIC PANEL - Abnormal; Notable for the following components:   Glucose, Bld 143 (*)    BUN 44 (*)    Creatinine, Ser 8.77 (*)    Calcium 8.1 (*)    Total Protein 6.2 (*)    Albumin 3.4 (*)    AST 12 (*)    GFR calc non Af Amer 5 (*)    GFR calc Af Amer 6 (*)    All other components within normal limits  CBC WITH DIFFERENTIAL/PLATELET - Abnormal; Notable for the following components:   RBC 3.40 (*)    Hemoglobin 9.4 (*)    HCT 31.2 (*)    Lymphs Abs 0.5 (*)    All other components within normal limits  URINALYSIS, ROUTINE W REFLEX MICROSCOPIC - Abnormal; Notable for the following components:   pH 9.0 (*)    Glucose, UA 50 (*)    Protein, ur 100 (*)    All other components within normal limits  I-STAT BETA HCG BLOOD, ED ([facility], [facility], [facility] ONLY) - Abnormal; Notable for the following components:   I-stat hCG, quantitative 7.0 (*)    All other components within normal limits  LIPASE, BLOOD  HCG, QUANTITATIVE, PREGNANCY  HIV ANTIBODY (ROUTINE TESTING W REFLEX)  RPR  GC/CHLAMYDIA PROBE AMP ([place]) NOT AT [facility]    EKG None  Radiology Ct Head Wo Contrast  Result Date: 11/04/2018 CLINICAL DATA:  Headache, acute, severe, worst HA of life EXAM: CT HEAD WITHOUT CONTRAST TECHNIQUE: Contiguous axial images were obtained from the base of the skull through the  vertex without intravenous contrast. COMPARISON:  Head CT 11/10/2011 FINDINGS: Brain: No intracranial hemorrhage, mass effect, or midline shift. No hydrocephalus. The basilar cisterns are patent. No evidence of territorial infarct or acute ischemia. 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 No extra-axial or intracranial fluid collection. Vascular: Dense vascular calcification of skull base, age advanced. No hyperdense vessel. Skull: No fracture or focal lesion. Sinuses/Orbits: Bilateral lens extraction. Mild opacification of lower right mastoid air cells. Other: None. IMPRESSION: 1. No acute intracranial abnormality. 2. Age advanced vascular calcifications at the skull base. 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 Electronically Signed   By: Keith Rake M.D.   On: 11/04/2018 03:07    Procedures Procedures (including critical care time)  Medications Ordered in ED Medications  sodium chloride 0.9 % bolus 1,000 mL (0 mLs Intravenous Stopped 11/04/18 0146)  prochlorperazine (COMPAZINE) injection 10 mg (10 mg Intravenous Given 11/04/18 0105)  diphenhydrAMINE (BENADRYL) injection 25 mg (25 mg Intravenous Given 11/04/18 0105)  ketamine 50 mg in normal saline 5 mL (10 mg/mL) syringe (10 mg Intravenous Given 11/04/18 0210)     Initial Impression / Assessment and Plan / ED Course  I have reviewed the triage vital signs and the nursing notes.  Pertinent labs & imaging results that were available during my care of the patient were reviewed by me and considered in my medical decision making (see chart for details).  Patient presents for evaluation of 1 week of lower abdominal pain and generalized headache.  Symptoms.  Persistent: Patient has been going to dialysis despite this pain, has been taking her medications regularly and has not missed any doses.  Has not had any associated fevers or chills.  On arrival patient is tachycardic and tachypneic, mildly hypertensive appears uncomfortable with  waves of severe pain.  She has no focal neurologic deficits on exam. There is tenderness across the lower abdomen without guarding or rigidity, I have low suspicion of acute surgical abdomen, very similar presentation to prior abdominal pain visits. Doubt dissection as pain has been ongoing for a week, and I would expect this to have significantly worsened of dissection present. Will get abdominal labs and head CT, and perform pelvic exam as above pt has hx of multiple pelvic infections. Will give fluids and headache cocktail.  Went into reassess pt and she is repeatedly requesting dilaudid, reporting this is what they always give her for her pain and she doesn't want to take anything else that could hurt her kidneys. Explained purpose of headache cocktail and pt is in agreement to try this.   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  Discussed with Dr. Tyrone Nine who recommends trying ketamine for pain and pt is in agreement with this plan.  Labs overall very reassuring, no leukocytosis, hemoglobin stable with chronic anemia, no acute electrolyte derangements, creatinine is at baseline when compared to recent labs at [facility].  Normal LFTs.Urinalysis without signs of infection.  I-STAT beta-hCG is slightly positive at 7 I suspect this is a false positive, a formal quantitative hCG is 1 which would be considered negative.  Movement pelvic exam reveals a small amount of discharge but no focal adnexal tenderness or pain.  After migraine cocktail and ketamine patient is feeling much better, she reports headache is resolved and abdominal pain is significantly improved, she has no significant tenderness on exam at this point.  Given reassuring labs and that pain is significantly improved with treatment here in the emergency department feel the patient  can be discharged home, her wet prep shows signs of BV, other STD testing is pending  and patient is aware.  She continues to have some mild tachycardia but on chart review this is fairly typical for the patient and she is feeling much better.  At this time she will be discharged home encouraged follow-up with her primary care doctor and nephrologist.  Return precautions discussed.  Patient expresses understanding and agreement with plan.  Patient discussed with Dr. Tyrone Nine, who saw patient as well and agrees with plan.   Final Clinical Impressions(s) / ED Diagnoses   Final diagnoses:  Bad headache  Lower abdominal pain  BV (bacterial vaginosis)    ED Discharge Orders         Ordered    metroNIDAZOLE (FLAGYL) 500 MG tablet  2 times daily     11/04/18 0545           Jacqlyn Larsen, PA-C 11/05/18 [place], [place], DO 11/05/18 2308

## 2018-11-04 NOTE — Discharge Instructions (Signed)
Your work-up today is reassuring, labs overall look good and your kidney function is at its baseline.  Your wet prep does show signs of bacterial vaginosis which is an overgrowth of the natural bacteria in the vagina, please take Flagyl twice daily to treat this, avoid any alcohol when taking this medication.  You have STD testing pending you will be contacted by phone if any of your results are positive.  CT of your head did not show any acute changes today, does show suggestion of a remote infarct in your cerebellum.  Please call to schedule follow-up appointment with your primary care doctor and continue with your dialysis regularly.  Return to the emergency department if you have new or worsening abdominal pain, fevers, vomiting, blood in the stools, or if you develop worsening sudden headache, vision changes, numbness weakness or tingling or any other new or concerning symptoms.

## 2018-11-06 DIAGNOSIS — N186 End stage renal disease: Secondary | ICD-10-CM | POA: Diagnosis not present

## 2018-11-06 DIAGNOSIS — N2581 Secondary hyperparathyroidism of renal origin: Secondary | ICD-10-CM | POA: Diagnosis not present

## 2018-11-06 DIAGNOSIS — D509 Iron deficiency anemia, unspecified: Secondary | ICD-10-CM | POA: Diagnosis not present

## 2018-11-06 DIAGNOSIS — D631 Anemia in chronic kidney disease: Secondary | ICD-10-CM | POA: Diagnosis not present

## 2018-11-06 DIAGNOSIS — E1129 Type 2 diabetes mellitus with other diabetic kidney complication: Secondary | ICD-10-CM | POA: Diagnosis not present

## 2018-12-04 DIAGNOSIS — Z992 Dependence on renal dialysis: Secondary | ICD-10-CM | POA: Diagnosis not present

## 2018-12-04 DIAGNOSIS — D509 Iron deficiency anemia, unspecified: Secondary | ICD-10-CM | POA: Diagnosis not present

## 2018-12-04 DIAGNOSIS — T8610 Unspecified complication of kidney transplant: Secondary | ICD-10-CM | POA: Diagnosis not present

## 2018-12-04 DIAGNOSIS — N189 Chronic kidney disease, unspecified: Secondary | ICD-10-CM | POA: Diagnosis not present

## 2018-12-04 DIAGNOSIS — E1129 Type 2 diabetes mellitus with other diabetic kidney complication: Secondary | ICD-10-CM | POA: Diagnosis not present

## 2018-12-04 DIAGNOSIS — D631 Anemia in chronic kidney disease: Secondary | ICD-10-CM | POA: Diagnosis not present

## 2018-12-04 DIAGNOSIS — N186 End stage renal disease: Secondary | ICD-10-CM | POA: Diagnosis not present

## 2018-12-04 DIAGNOSIS — N2581 Secondary hyperparathyroidism of renal origin: Secondary | ICD-10-CM | POA: Diagnosis not present

## 2018-12-20 ENCOUNTER — Emergency Department (HOSPITAL_COMMUNITY): Payer: Medicare Other

## 2018-12-20 ENCOUNTER — Inpatient Hospital Stay (HOSPITAL_COMMUNITY)
Admission: EM | Admit: 2018-12-20 | Discharge: 2018-12-27 | DRG: 193 | Disposition: A | Discharge status: Home / Self Care | Payer: Medicare Other | Source: Ambulatory Visit | Attending: Internal Medicine | Admitting: Internal Medicine

## 2018-12-20 ENCOUNTER — Inpatient Hospital Stay (HOSPITAL_COMMUNITY): Payer: Medicare Other

## 2018-12-20 ENCOUNTER — Other Ambulatory Visit: Payer: Self-pay

## 2018-12-20 ENCOUNTER — Encounter (HOSPITAL_COMMUNITY): Payer: Self-pay | Admitting: Emergency Medicine

## 2018-12-20 DIAGNOSIS — N186 End stage renal disease: Secondary | ICD-10-CM | POA: Diagnosis not present

## 2018-12-20 DIAGNOSIS — D899 Disorder involving the immune mechanism, unspecified: Secondary | ICD-10-CM

## 2018-12-20 DIAGNOSIS — J189 Pneumonia, unspecified organism: Secondary | ICD-10-CM | POA: Diagnosis not present

## 2018-12-20 DIAGNOSIS — F17290 Nicotine dependence, other tobacco product, uncomplicated: Secondary | ICD-10-CM | POA: Diagnosis not present

## 2018-12-20 DIAGNOSIS — R197 Diarrhea, unspecified: Secondary | ICD-10-CM | POA: Diagnosis not present

## 2018-12-20 DIAGNOSIS — I517 Cardiomegaly: Secondary | ICD-10-CM | POA: Diagnosis not present

## 2018-12-20 DIAGNOSIS — K3184 Gastroparesis: Secondary | ICD-10-CM | POA: Diagnosis not present

## 2018-12-20 DIAGNOSIS — R109 Unspecified abdominal pain: Secondary | ICD-10-CM | POA: Diagnosis not present

## 2018-12-20 DIAGNOSIS — Z7982 Long term (current) use of aspirin: Secondary | ICD-10-CM | POA: Diagnosis not present

## 2018-12-20 DIAGNOSIS — R0789 Other chest pain: Secondary | ICD-10-CM | POA: Diagnosis not present

## 2018-12-20 DIAGNOSIS — R079 Chest pain, unspecified: Secondary | ICD-10-CM | POA: Diagnosis not present

## 2018-12-20 DIAGNOSIS — Z992 Dependence on renal dialysis: Secondary | ICD-10-CM

## 2018-12-20 DIAGNOSIS — Z8249 Family history of ischemic heart disease and other diseases of the circulatory system: Secondary | ICD-10-CM

## 2018-12-20 DIAGNOSIS — R509 Fever, unspecified: Secondary | ICD-10-CM | POA: Diagnosis not present

## 2018-12-20 DIAGNOSIS — Z9115 Patient's noncompliance with renal dialysis: Secondary | ICD-10-CM | POA: Diagnosis not present

## 2018-12-20 DIAGNOSIS — Z9889 Other specified postprocedural states: Secondary | ICD-10-CM | POA: Diagnosis not present

## 2018-12-20 DIAGNOSIS — R7982 Elevated C-reactive protein (CRP): Secondary | ICD-10-CM | POA: Diagnosis present

## 2018-12-20 DIAGNOSIS — Z20828 Contact with and (suspected) exposure to other viral communicable diseases: Secondary | ICD-10-CM | POA: Diagnosis not present

## 2018-12-20 DIAGNOSIS — R0603 Acute respiratory distress: Secondary | ICD-10-CM | POA: Diagnosis not present

## 2018-12-20 DIAGNOSIS — E1022 Type 1 diabetes mellitus with diabetic chronic kidney disease: Secondary | ICD-10-CM | POA: Diagnosis present

## 2018-12-20 DIAGNOSIS — E8779 Other fluid overload: Secondary | ICD-10-CM | POA: Diagnosis present

## 2018-12-20 DIAGNOSIS — J181 Lobar pneumonia, unspecified organism: Secondary | ICD-10-CM | POA: Diagnosis not present

## 2018-12-20 DIAGNOSIS — D631 Anemia in chronic kidney disease: Secondary | ICD-10-CM | POA: Diagnosis present

## 2018-12-20 DIAGNOSIS — J9601 Acute respiratory failure with hypoxia: Secondary | ICD-10-CM | POA: Diagnosis not present

## 2018-12-20 DIAGNOSIS — I700 Atherosclerosis of aorta: Secondary | ICD-10-CM | POA: Diagnosis present

## 2018-12-20 DIAGNOSIS — Z8639 Personal history of other endocrine, nutritional and metabolic disease: Secondary | ICD-10-CM | POA: Diagnosis not present

## 2018-12-20 DIAGNOSIS — Z9483 Pancreas transplant status: Secondary | ICD-10-CM | POA: Diagnosis not present

## 2018-12-20 DIAGNOSIS — N2581 Secondary hyperparathyroidism of renal origin: Secondary | ICD-10-CM | POA: Diagnosis not present

## 2018-12-20 DIAGNOSIS — M940 Chondrocostal junction syndrome [Tietze]: Secondary | ICD-10-CM | POA: Diagnosis present

## 2018-12-20 DIAGNOSIS — Z79899 Other long term (current) drug therapy: Secondary | ICD-10-CM | POA: Diagnosis not present

## 2018-12-20 DIAGNOSIS — E8889 Other specified metabolic disorders: Secondary | ICD-10-CM | POA: Diagnosis not present

## 2018-12-20 DIAGNOSIS — Y830 Surgical operation with transplant of whole organ as the cause of abnormal reaction of the patient, or of later complication, without mention of misadventure at the time of the procedure: Secondary | ICD-10-CM | POA: Diagnosis present

## 2018-12-20 DIAGNOSIS — R0902 Hypoxemia: Secondary | ICD-10-CM | POA: Diagnosis not present

## 2018-12-20 DIAGNOSIS — R0602 Shortness of breath: Secondary | ICD-10-CM | POA: Diagnosis not present

## 2018-12-20 DIAGNOSIS — L299 Pruritus, unspecified: Secondary | ICD-10-CM | POA: Diagnosis present

## 2018-12-20 DIAGNOSIS — N189 Chronic kidney disease, unspecified: Secondary | ICD-10-CM | POA: Diagnosis not present

## 2018-12-20 DIAGNOSIS — E875 Hyperkalemia: Secondary | ICD-10-CM | POA: Diagnosis not present

## 2018-12-20 DIAGNOSIS — T8612 Kidney transplant failure: Secondary | ICD-10-CM | POA: Diagnosis present

## 2018-12-20 DIAGNOSIS — I120 Hypertensive chronic kidney disease with stage 5 chronic kidney disease or end stage renal disease: Secondary | ICD-10-CM | POA: Diagnosis present

## 2018-12-20 DIAGNOSIS — D72829 Elevated white blood cell count, unspecified: Secondary | ICD-10-CM | POA: Diagnosis not present

## 2018-12-20 DIAGNOSIS — Z7952 Long term (current) use of systemic steroids: Secondary | ICD-10-CM | POA: Diagnosis not present

## 2018-12-20 DIAGNOSIS — E1043 Type 1 diabetes mellitus with diabetic autonomic (poly)neuropathy: Secondary | ICD-10-CM | POA: Diagnosis present

## 2018-12-20 DIAGNOSIS — R05 Cough: Secondary | ICD-10-CM | POA: Diagnosis not present

## 2018-12-20 DIAGNOSIS — Z91013 Allergy to seafood: Secondary | ICD-10-CM | POA: Diagnosis not present

## 2018-12-20 DIAGNOSIS — D849 Immunodeficiency, unspecified: Secondary | ICD-10-CM | POA: Diagnosis present

## 2018-12-20 DIAGNOSIS — Z09 Encounter for follow-up examination after completed treatment for conditions other than malignant neoplasm: Secondary | ICD-10-CM

## 2018-12-20 DIAGNOSIS — R918 Other nonspecific abnormal finding of lung field: Secondary | ICD-10-CM | POA: Diagnosis not present

## 2018-12-20 DIAGNOSIS — Z794 Long term (current) use of insulin: Secondary | ICD-10-CM

## 2018-12-20 LAB — LACTIC ACID, PLASMA
Lactic Acid, Venous: 1.4 mmol/L (ref 0.5–1.9)
Lactic Acid, Venous: 1.7 mmol/L (ref 0.5–1.9)

## 2018-12-20 LAB — CBC WITH DIFFERENTIAL/PLATELET
Abs Immature Granulocytes: 0.1 10*3/uL — ABNORMAL HIGH (ref 0.00–0.07)
Abs Immature Granulocytes: 0.09 10*3/uL — ABNORMAL HIGH (ref 0.00–0.07)
Basophils Absolute: 0 10*3/uL (ref 0.0–0.1)
Basophils Absolute: 0 10*3/uL (ref 0.0–0.1)
Basophils Relative: 0 %
Basophils Relative: 0 %
Eosinophils Absolute: 0.2 10*3/uL (ref 0.0–0.5)
Eosinophils Absolute: 0.1 10*3/uL (ref 0.0–0.5)
Eosinophils Relative: 2 %
Eosinophils Relative: 1 %
HCT: 26.5 % — ABNORMAL LOW (ref 36.0–46.0)
HCT: 23.3 % — ABNORMAL LOW (ref 36.0–46.0)
Hemoglobin: 8.2 g/dL — ABNORMAL LOW (ref 12.0–15.0)
Hemoglobin: 7.5 g/dL — ABNORMAL LOW (ref 12.0–15.0)
Immature Granulocytes: 1 %
Immature Granulocytes: 1 %
Lymphocytes Relative: 16 %
Lymphocytes Relative: 11 %
Lymphs Abs: 1.6 10*3/uL (ref 0.7–4.0)
Lymphs Abs: 1.2 10*3/uL (ref 0.7–4.0)
MCH: 30.7 pg (ref 26.0–34.0)
MCH: 30.4 pg (ref 26.0–34.0)
MCHC: 30.9 g/dL (ref 30.0–36.0)
MCHC: 32.2 g/dL (ref 30.0–36.0)
MCV: 99.3 fL (ref 80.0–100.0)
MCV: 94.3 fL (ref 80.0–100.0)
Monocytes Absolute: 0.5 10*3/uL (ref 0.1–1.0)
Monocytes Absolute: 0.5 10*3/uL (ref 0.1–1.0)
Monocytes Relative: 5 %
Monocytes Relative: 5 %
Neutro Abs: 7.4 10*3/uL (ref 1.7–7.7)
Neutro Abs: 9.4 10*3/uL — ABNORMAL HIGH (ref 1.7–7.7)
Neutrophils Relative %: 76 %
Neutrophils Relative %: 82 %
Platelets: 171 10*3/uL (ref 150–400)
Platelets: 167 10*3/uL (ref 150–400)
RBC: 2.67 MIL/uL — ABNORMAL LOW (ref 3.87–5.11)
RBC: 2.47 MIL/uL — ABNORMAL LOW (ref 3.87–5.11)
RDW: 15 % (ref 11.5–15.5)
RDW: 15.2 % (ref 11.5–15.5)
WBC: 9.8 10*3/uL (ref 4.0–10.5)
WBC: 11.4 10*3/uL — ABNORMAL HIGH (ref 4.0–10.5)
nRBC: 0 % (ref 0.0–0.2)
nRBC: 0 % (ref 0.0–0.2)

## 2018-12-20 LAB — COMPREHENSIVE METABOLIC PANEL
ALT: 15 U/L (ref 0–44)
AST: 11 U/L — ABNORMAL LOW (ref 15–41)
Albumin: 3.6 g/dL (ref 3.5–5.0)
Alkaline Phosphatase: 57 U/L (ref 38–126)
Anion gap: 20 — ABNORMAL HIGH (ref 5–15)
BUN: 81 mg/dL — ABNORMAL HIGH (ref 6–20)
CO2: 18 mmol/L — ABNORMAL LOW (ref 22–32)
Calcium: 8.9 mg/dL (ref 8.9–10.3)
Chloride: 102 mmol/L (ref 98–111)
Creatinine, Ser: 11.52 mg/dL — ABNORMAL HIGH (ref 0.44–1.00)
GFR calc Af Amer: 4 mL/min — ABNORMAL LOW (ref >60)
GFR calc non Af Amer: 4 mL/min — ABNORMAL LOW (ref >60)
Glucose, Bld: 86 mg/dL (ref 70–99)
Potassium: 6.6 mmol/L (ref 3.5–5.1)
Sodium: 140 mmol/L (ref 135–145)
Total Bilirubin: 0.6 mg/dL (ref 0.3–1.2)
Total Protein: 6.9 g/dL (ref 6.5–8.1)

## 2018-12-20 LAB — SARS CORONAVIRUS 2 BY RT PCR (HOSPITAL ORDER, PERFORMED IN ~~LOC~~ HOSPITAL LAB): SARS Coronavirus 2: NEGATIVE

## 2018-12-20 LAB — RESPIRATORY PANEL BY PCR

## 2018-12-20 LAB — BLOOD GAS, ARTERIAL
Acid-Base Excess: 6 mmol/L — ABNORMAL HIGH (ref 0.0–2.0)
Bicarbonate: 29.3 mmol/L — ABNORMAL HIGH (ref 20.0–28.0)
Drawn by: 550621
O2 Content: 8 L/min
O2 Saturation: 89.3 %
Patient temperature: 103
pCO2 arterial: 42 mmHg (ref 32.0–48.0)
pH, Arterial: 7.469 — ABNORMAL HIGH (ref 7.350–7.450)
pO2, Arterial: 66.5 mmHg — ABNORMAL LOW (ref 83.0–108.0)

## 2018-12-20 LAB — I-STAT BETA HCG BLOOD, ED (MC, WL, AP ONLY): I-stat hCG, quantitative: 10 m[IU]/mL — ABNORMAL HIGH (ref <5)

## 2018-12-20 LAB — ABO/RH: ABO/RH(D): O POS

## 2018-12-20 LAB — TROPONIN I: Troponin I: <0.03 ng/mL (ref <0.03)

## 2018-12-20 LAB — PROTIME-INR
INR: 1.2 (ref 0.8–1.2)
Prothrombin Time: 14.9 seconds (ref 11.4–15.2)

## 2018-12-20 IMAGING — CT CT ABDOMEN AND PELVIS WITH CONTRAST
2 of 5 series · 14 of 46 positions shown, 16 images · IV contrast (omnipaque)
Comparison: CT the abdomen and pelvis [DATE].

CLINICAL DATA: 37-year-old female with history of renal transplant
on dialysis since [DATE]. Abdominal pain. Nausea, vomiting and
diarrhea today. Fever to 102.3 degrees. Body aches and chills.
Possible [IX].

EXAM:
CT ABDOMEN AND PELVIS WITH CONTRAST
TECHNIQUE: Multidetector CT imaging of the abdomen and pelvis was performed
using the standard protocol following bolus administration of
intravenous contrast.
CONTRAST:  100mL OMNIPAQUE IOHEXOL 300 MG/ML  SOLN

[Series 3: abdomen 5.0 · axial · 0.87mm/px · z∈[+801,+1206]mm · 11 of 95 slices shown, 13 images]
[im 7/95  soft-tissue]
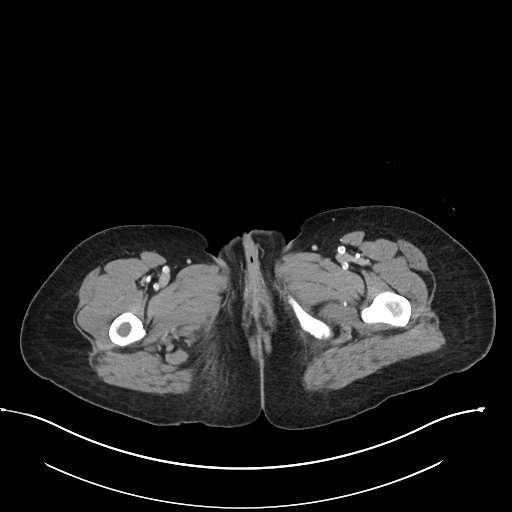
[im 7/95  bone]
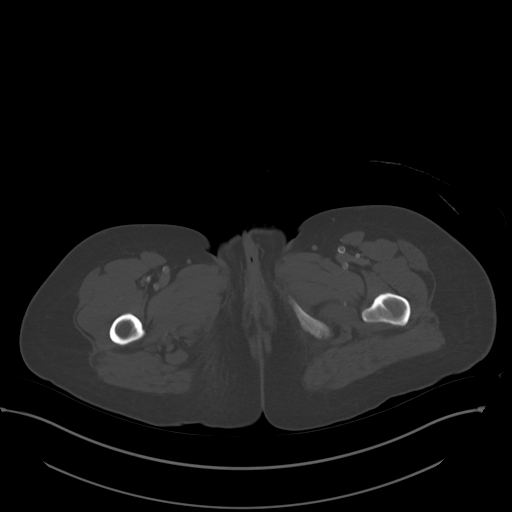
[im 14/95  soft-tissue]
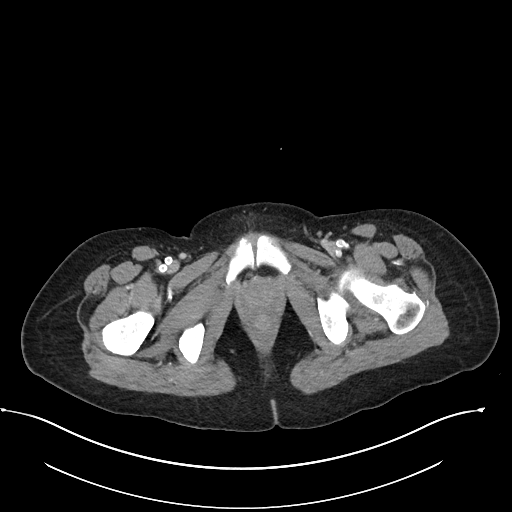
[im 21/95  soft-tissue]
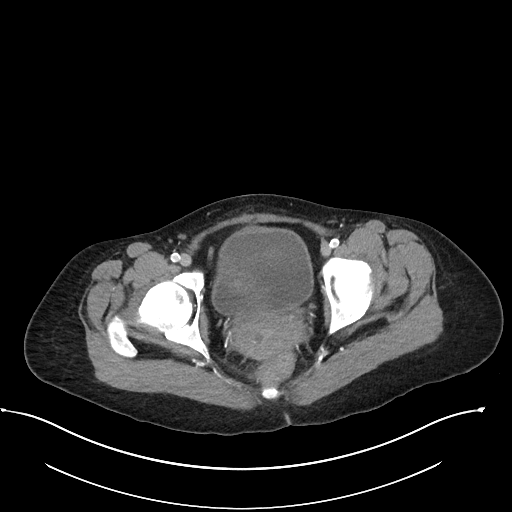
[im 34/95  soft-tissue]
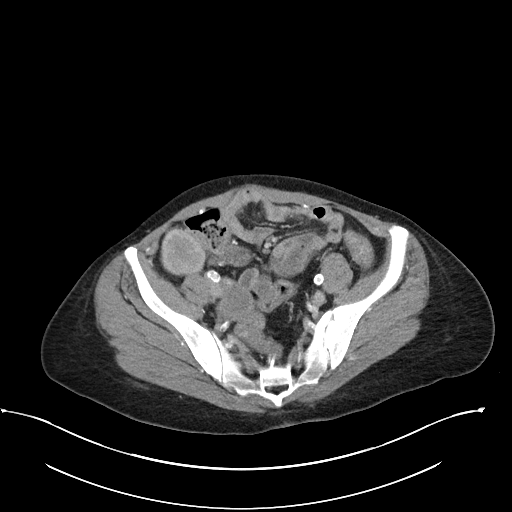
[im 41/95  soft-tissue]
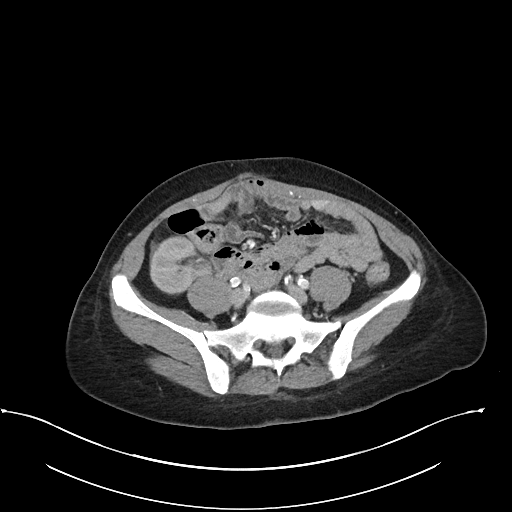
[im 48/95  soft-tissue]
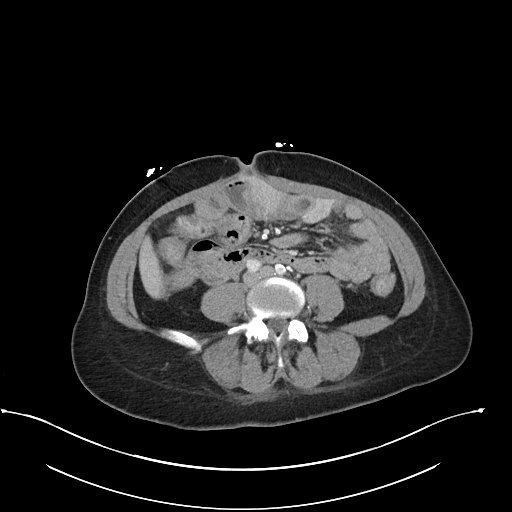
[im 54/95  soft-tissue]
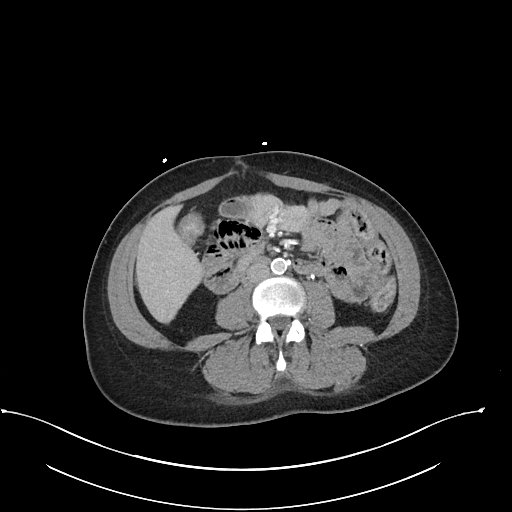
[im 61/95  soft-tissue]
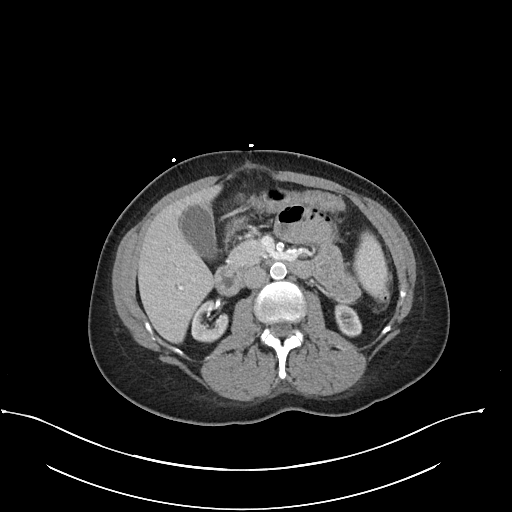
[im 74/95  soft-tissue]
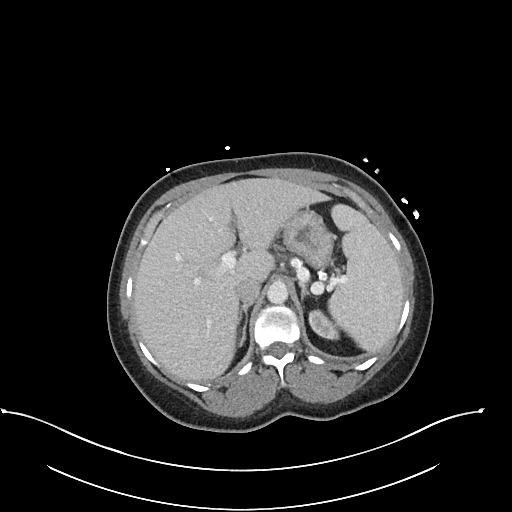
[im 74/95  bone]
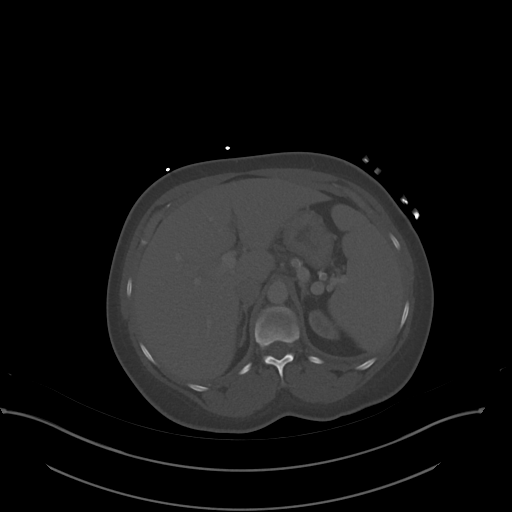
[im 81/95  soft-tissue]
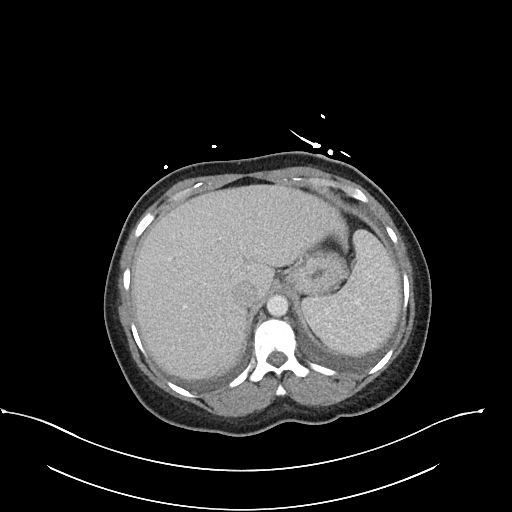
[im 88/95  soft-tissue]
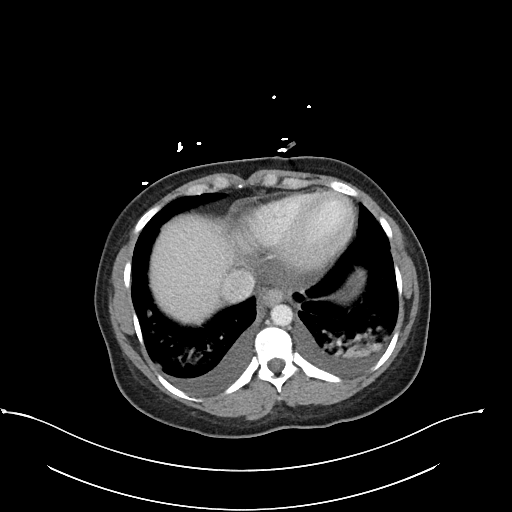

[Series 6: abdomen 3.0 mpr cor · coronal · 0.68mm/px · 3 of 97 slices shown]
[im 33/97  soft-tissue]
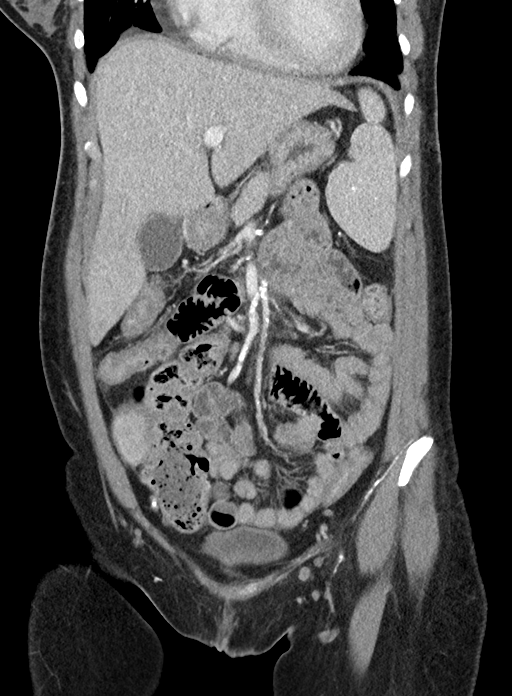
[im 43/97  soft-tissue]
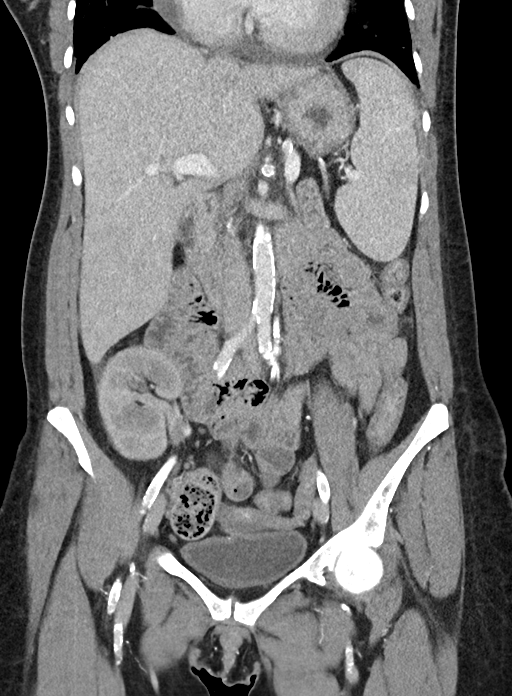
[im 54/97  soft-tissue]
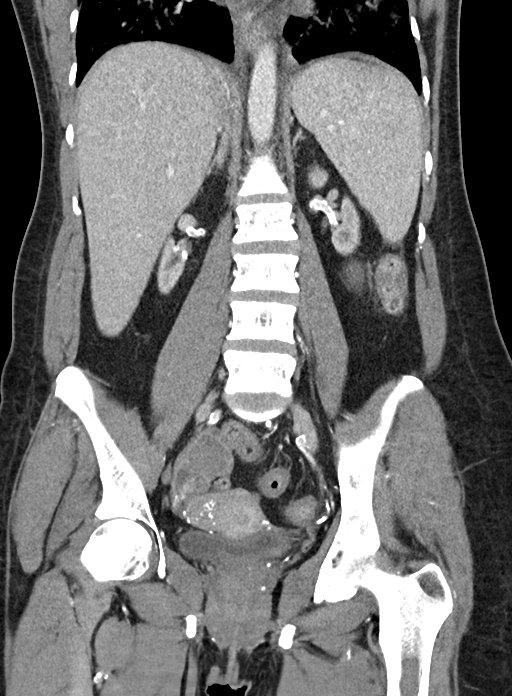

[14 of 46 positions shown; findings below may reference images not displayed]

FINDINGS: Lower chest: Small bilateral pleural effusions lying dependently.
Trace amount of pericardial fluid and/or thickening, unlikely to be
of any hemodynamic significance at this time. Widespread areas of
ground-glass attenuation throughout the lung bases, new compared to
the prior examination. This is accompanied by interlobular septal
thickening. Areas of more dense consolidation are also noted in the
posterior aspect of the left lower lobe. Several more nodular areas
of ground-glass attenuation are also noted throughout the visualize
lung bases.

Hepatobiliary: No suspicious cystic or solid hepatic lesions. No
intra or extrahepatic biliary ductal dilatation. Gallbladder is
normal in appearance.

Pancreas: No pancreatic mass. Atrophy in the distal body and tail of
the pancreas. No pancreatic ductal dilatation. No pancreatic or
peripancreatic fluid or inflammatory changes.

Spleen: Unremarkable.

Adrenals/Urinary Tract: Severe atrophy of the native kidneys
bilaterally. Bilateral adrenal glands are normal in appearance. No
hydroureteronephrosis. Urinary bladder is normal in appearance.
Transplant kidney in the right lower quadrant with trace amount of
perinephric fluid.

Stomach/Bowel: Normal appearance of the stomach. No pathologic
dilatation of small bowel or colon. Normal appendix.

Vascular/Lymphatic: Aortic atherosclerosis, without evidence of
aneurysm or dissection in the abdominal or pelvic vasculature. No
lymphadenopathy noted in the abdomen or pelvis.

Reproductive: Exophytic 1.6 cm enhancing lesion in the anterior
aspect of the uterine fundus, presumably a small fibroid. Ovaries
are unremarkable in appearance.

Other: Trace volume of free fluid in the low anatomic pelvis. Trace
amount of fluid adjacent to the transplant kidney in the right lower
quadrant. No larger volume of ascites. No pneumoperitoneum.

Musculoskeletal: There are no aggressive appearing lytic or blastic
lesions noted in the visualized portions of the skeleton.
IMPRESSION: 1. Findings in the lung bases are favored to reflect pulmonary edema
from fluid overload, however, given the more dense consolidation in
the left lower lobe, superimposed acute infection is not excluded,
including viral pneumonia.
2. Small bilateral pleural effusions lying dependently.
3. Trace pericardial fluid and/or thickening, unlikely to be of any
hemodynamic significance at this time.
4. No acute findings are noted in the abdomen or pelvis.
5. Trace amount of perinephric fluid adjacent to the patient's right
transplant kidney. The transplant kidney is otherwise normal in
appearance.
6. Aortic atherosclerosis.
7. Additional incidental findings, as above.

Critical Value/emergent results were called by telephone at the time
of interpretation on [DATE] at [DATE] to Dr. AUNTYJATTY, who
verbally acknowledged these results.

## 2018-12-20 IMAGING — DX PORTABLE CHEST - 1 VIEW
1 series · 1 of 1 positions shown · non-contrast
Comparison: [DATE]

CLINICAL DATA: Shortness of breath.  Chronic renal failure.  Fever.

EXAM:
PORTABLE CHEST 1 VIEW

[chest ap]
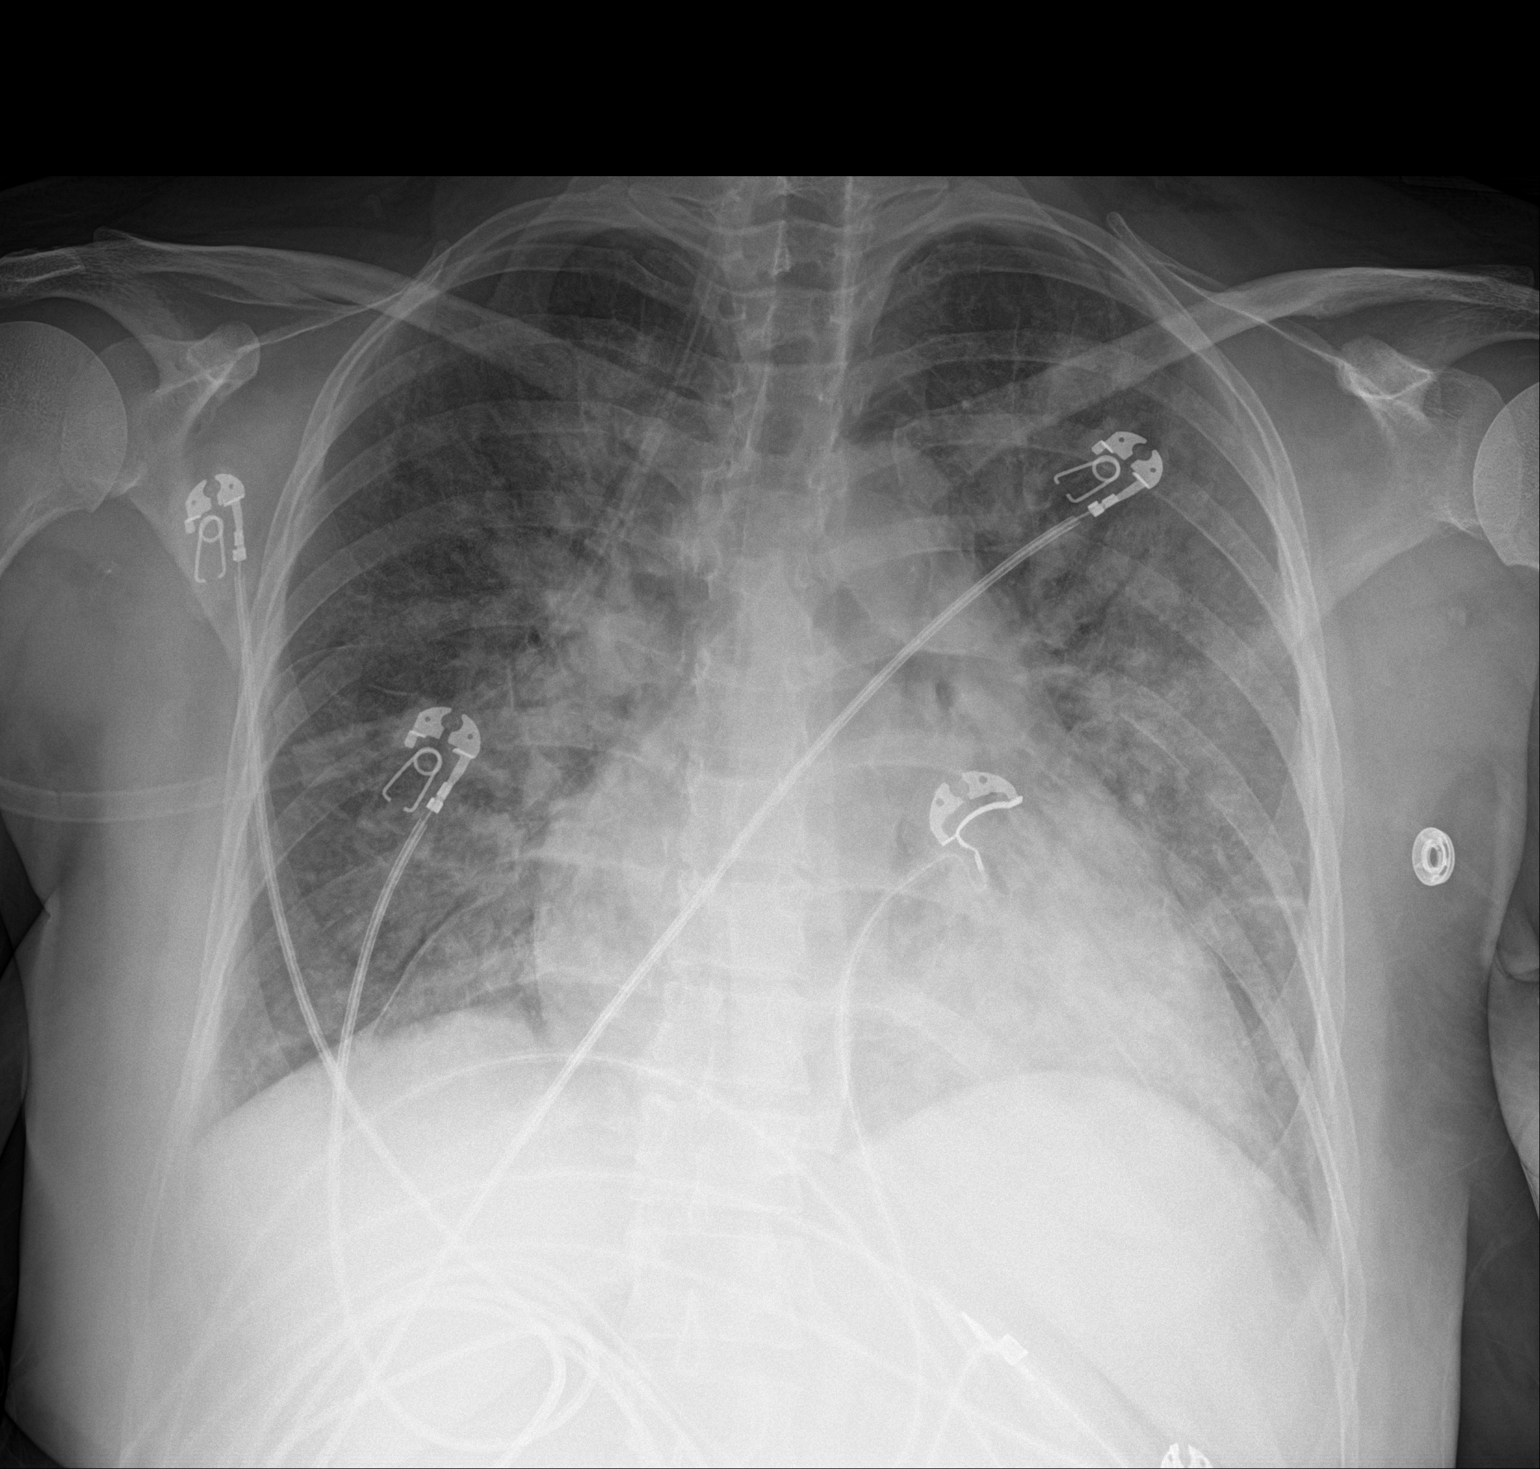

[1 of 1 positions shown; findings below may reference images not displayed]

FINDINGS: There is cardiomegaly with pulmonary venous hypertension. There is
airspace opacity throughout the lungs, suspicious for alveolar
edema. There is also a degree of underlying interstitial edema. No
adenopathy. No bone lesions.
IMPRESSION: Pulmonary vascular congestion with diffuse opacity, likely due to
pulmonary edema with congestive heart failure. Note that there may
be a degree of superimposed pneumonia and/or ARDS. More than one of
these entities may exist concurrently.

## 2018-12-20 IMAGING — DX CHEST  1 VIEW
1 series · 1 of 1 positions shown · non-contrast
Comparison: [DATE] chest radiograph.

CLINICAL DATA: 37 y/o F; hypoxia, patient being tested for cord
with.

EXAM:
CHEST  1 VIEW

[chest]
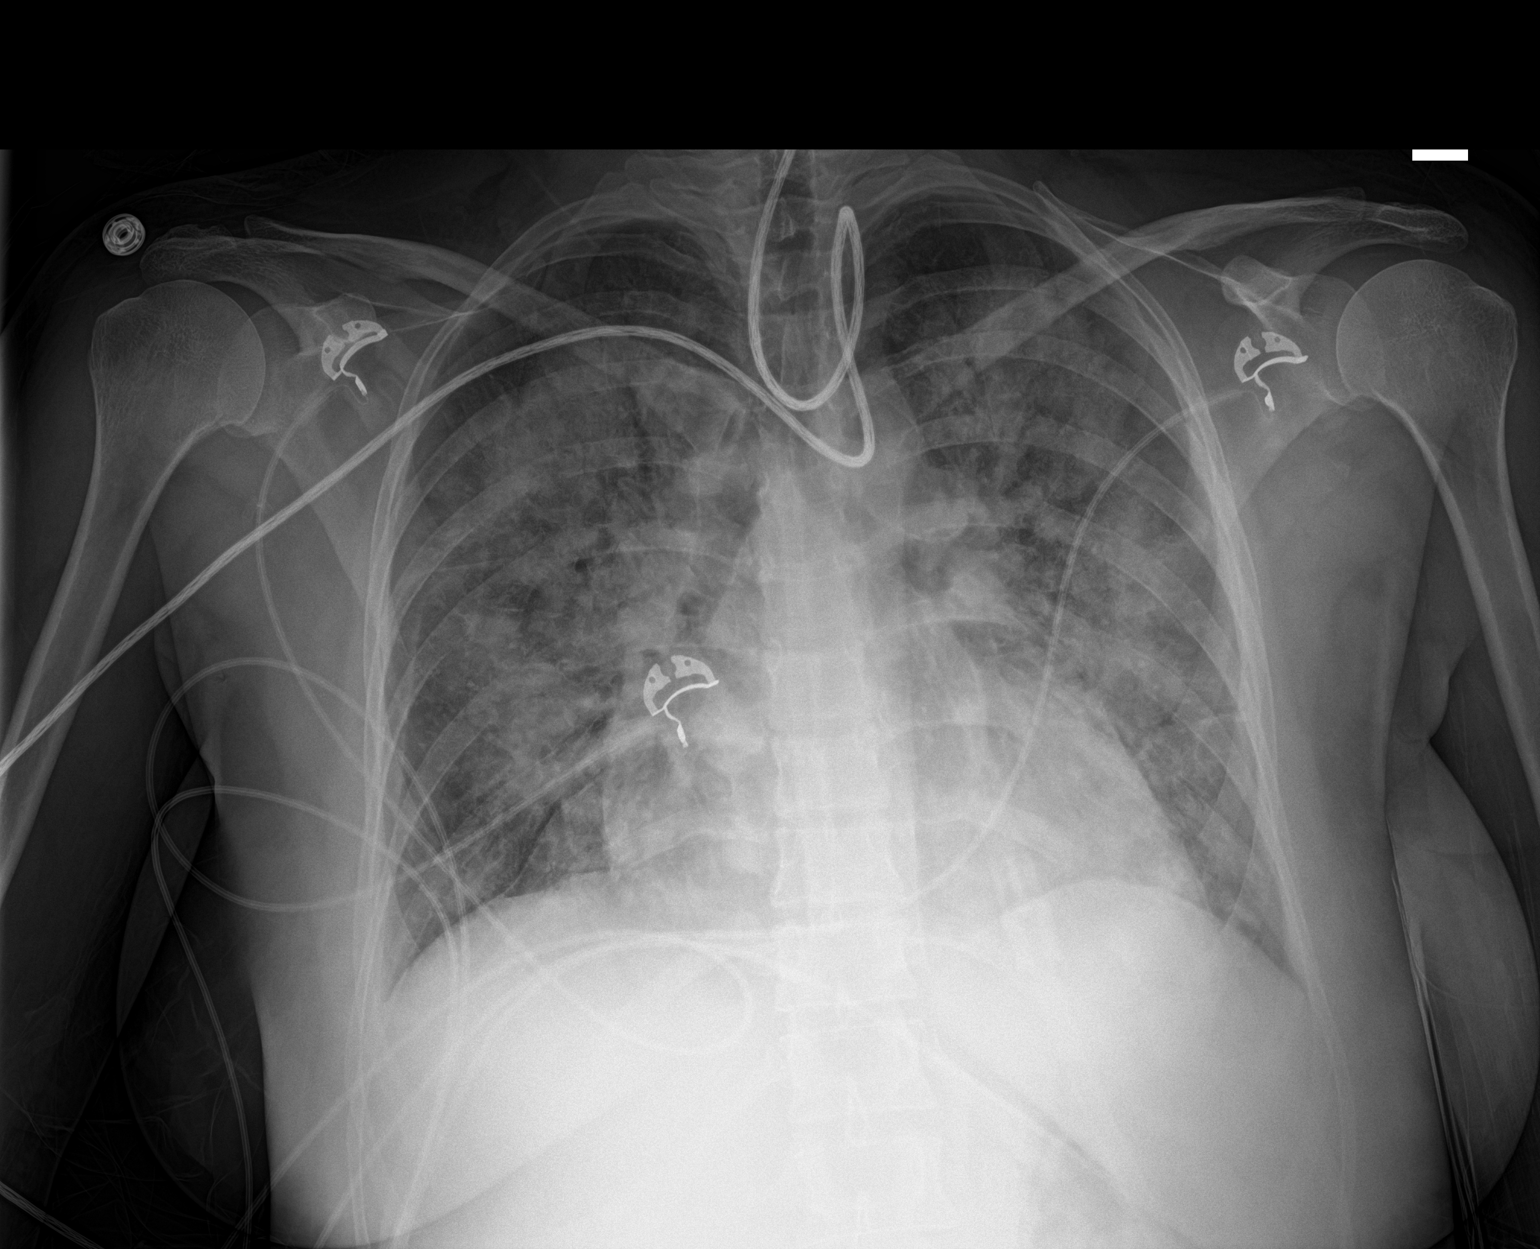

[1 of 1 positions shown; findings below may reference images not displayed]

FINDINGS: Stable enlarged cardiac silhouette given projection and technique.
Diffuse pulmonary opacities are mildly worsened. No pleural effusion
or pneumothorax. Bones are unremarkable.
IMPRESSION: Stable enlarged cardiac silhouette. Mild worsening of diffuse
pulmonary opacities.

## 2018-12-20 MED ORDER — LABETALOL HCL 5 MG/ML IV SOLN
10.0000 mg | Freq: Once | INTRAVENOUS | Status: AC
  Administered 2018-12-20: 10 mg via INTRAVENOUS
  Filled 2018-12-20: qty 4

## 2018-12-20 MED ORDER — METOCLOPRAMIDE HCL 5 MG/ML IJ SOLN
10.0000 mg | Freq: Once | INTRAMUSCULAR | Status: AC
  Administered 2018-12-20: 10 mg via INTRAVENOUS
  Filled 2018-12-20: qty 2

## 2018-12-20 MED ORDER — HYDROMORPHONE HCL 1 MG/ML IJ SOLN
1.0000 mg | Freq: Once | INTRAMUSCULAR | Status: AC
  Administered 2018-12-20: 1 mg via INTRAVENOUS
  Filled 2018-12-20: qty 1

## 2018-12-20 MED ORDER — ONDANSETRON HCL 4 MG/2ML IJ SOLN
4.0000 mg | Freq: Four times a day (QID) | INTRAMUSCULAR | Status: DC | PRN
  Administered 2018-12-20 – 2018-12-26 (×7): 4 mg via INTRAVENOUS
  Filled 2018-12-20 (×7): qty 2

## 2018-12-20 MED ORDER — ONDANSETRON HCL 4 MG PO TABS
4.0000 mg | ORAL_TABLET | Freq: Four times a day (QID) | ORAL | Status: DC | PRN
  Administered 2018-12-24 – 2018-12-25 (×3): 4 mg via ORAL
  Filled 2018-12-20 (×4): qty 1

## 2018-12-20 MED ORDER — LABETALOL HCL 5 MG/ML IV SOLN: 10.0000 mg | Freq: Once | INTRAVENOUS | Status: DC | PRN

## 2018-12-20 MED ORDER — AMLODIPINE BESYLATE 10 MG PO TABS
10.0000 mg | ORAL_TABLET | Freq: Every day | ORAL | Status: DC
  Administered 2018-12-21 – 2018-12-26 (×7): 10 mg via ORAL
  Filled 2018-12-20 (×6): qty 2
  Filled 2018-12-20: qty 1

## 2018-12-20 MED ORDER — HYDROMORPHONE HCL 1 MG/ML IJ SOLN
0.5000 mg | Freq: Once | INTRAMUSCULAR | Status: AC
  Administered 2018-12-20: 0.5 mg via INTRAVENOUS
  Filled 2018-12-20: qty 1

## 2018-12-20 MED ORDER — ACETAMINOPHEN 325 MG PO TABS: 650.0000 mg | ORAL_TABLET | Freq: Four times a day (QID) | ORAL | Status: DC | PRN

## 2018-12-20 MED ORDER — DM-GUAIFENESIN ER 30-600 MG PO TB12
1.0000 | ORAL_TABLET | Freq: Two times a day (BID) | ORAL | Status: DC
  Administered 2018-12-20 – 2018-12-27 (×14): 1 via ORAL
  Filled 2018-12-20 (×14): qty 1

## 2018-12-20 MED ORDER — HEPARIN SODIUM (PORCINE) 5000 UNIT/ML IJ SOLN: 5000.0000 [IU] | Freq: Three times a day (TID) | INTRAMUSCULAR | Status: DC

## 2018-12-20 MED ORDER — ACETAMINOPHEN 650 MG RE SUPP
650.0000 mg | Freq: Once | RECTAL | Status: AC
  Administered 2018-12-20: 13:00:00 650 mg via RECTAL
  Filled 2018-12-20: qty 1

## 2018-12-20 MED ORDER — HEPARIN SODIUM (PORCINE) 5000 UNIT/ML IJ SOLN
5000.0000 [IU] | Freq: Three times a day (TID) | INTRAMUSCULAR | Status: DC
  Filled 2018-12-20 (×6): qty 1

## 2018-12-20 MED ORDER — DEXTROSE 50 % IV SOLN
1.0000 | Freq: Once | INTRAVENOUS | Status: AC
  Administered 2018-12-20: 16:00:00 50 mL via INTRAVENOUS
  Filled 2018-12-20: qty 50

## 2018-12-20 MED ORDER — CYCLOSPORINE MODIFIED (NEORAL) 100 MG PO CAPS
200.0000 mg | ORAL_CAPSULE | Freq: Two times a day (BID) | ORAL | Status: DC
  Administered 2018-12-20 – 2018-12-27 (×14): 200 mg via ORAL
  Filled 2018-12-20 (×16): qty 2

## 2018-12-20 MED ORDER — SODIUM CHLORIDE 0.9 % IV SOLN
500.0000 mg | INTRAVENOUS | Status: DC
  Administered 2018-12-21 – 2018-12-24 (×5): 500 mg via INTRAVENOUS
  Filled 2018-12-20 (×6): qty 500

## 2018-12-20 MED ORDER — SODIUM BICARBONATE 8.4 % IV SOLN
Freq: Once | INTRAVENOUS | Status: AC
  Administered 2018-12-20: 17:00:00 via INTRAVENOUS
  Filled 2018-12-20 (×2): qty 100

## 2018-12-20 MED ORDER — INSULIN ASPART 100 UNIT/ML IV SOLN
5.0000 [IU] | Freq: Once | INTRAVENOUS | Status: AC
  Administered 2018-12-20: 5 [IU] via INTRAVENOUS

## 2018-12-20 MED ORDER — CALCIUM GLUCONATE 10 % IV SOLN
1.0000 g | Freq: Once | INTRAVENOUS | Status: AC
  Administered 2018-12-20: 16:00:00 1 g via INTRAVENOUS
  Filled 2018-12-20: qty 10

## 2018-12-20 MED ORDER — SODIUM CHLORIDE 0.9 % IV SOLN
1.0000 g | INTRAVENOUS | Status: DC
  Administered 2018-12-20: 1 g via INTRAVENOUS
  Filled 2018-12-20: qty 10

## 2018-12-20 MED ORDER — CHLORHEXIDINE GLUCONATE CLOTH 2 % EX PADS
6.0000 | MEDICATED_PAD | Freq: Every day | CUTANEOUS | Status: DC
  Administered 2018-12-21: 6 via TOPICAL

## 2018-12-20 MED ORDER — PREDNISONE 5 MG PO TABS
5.0000 mg | ORAL_TABLET | Freq: Every day | ORAL | Status: DC
  Administered 2018-12-21 – 2018-12-27 (×7): 5 mg via ORAL
  Filled 2018-12-20 (×7): qty 1

## 2018-12-20 MED ORDER — ACETAMINOPHEN 650 MG RE SUPP: 650.0000 mg | Freq: Four times a day (QID) | RECTAL | Status: DC | PRN

## 2018-12-20 MED ORDER — ACETAMINOPHEN 325 MG PO TABS
650.0000 mg | ORAL_TABLET | Freq: Four times a day (QID) | ORAL | Status: DC
  Administered 2018-12-20 – 2018-12-23 (×12): 650 mg via ORAL
  Filled 2018-12-20 (×14): qty 2

## 2018-12-20 MED ORDER — SODIUM CHLORIDE 0.9% FLUSH
3.0000 mL | Freq: Once | INTRAVENOUS | Status: AC
  Administered 2018-12-20: 14:00:00 3 mL via INTRAVENOUS

## 2018-12-20 MED ORDER — POLYETHYLENE GLYCOL 3350 17 G PO PACK: 17.0000 g | PACK | Freq: Every day | ORAL | Status: DC | PRN

## 2018-12-20 MED ORDER — IOHEXOL 300 MG/ML  SOLN
100.0000 mL | Freq: Once | INTRAMUSCULAR | Status: AC | PRN
  Administered 2018-12-20: 15:00:00 100 mL via INTRAVENOUS

## 2018-12-20 MED ORDER — SODIUM CHLORIDE 0.9% FLUSH
3.0000 mL | Freq: Two times a day (BID) | INTRAVENOUS | Status: DC
  Administered 2018-12-20 – 2018-12-27 (×15): 3 mL via INTRAVENOUS

## 2018-12-20 MED ORDER — MYCOPHENOLATE SODIUM 180 MG PO TBEC
540.0000 mg | DELAYED_RELEASE_TABLET | Freq: Two times a day (BID) | ORAL | Status: DC
  Administered 2018-12-21 – 2018-12-27 (×14): 540 mg via ORAL
  Filled 2018-12-20 (×14): qty 3

## 2018-12-20 MED ORDER — ASPIRIN EC 81 MG PO TBEC
81.0000 mg | DELAYED_RELEASE_TABLET | Freq: Every morning | ORAL | Status: DC
  Administered 2018-12-21 – 2018-12-27 (×7): 81 mg via ORAL
  Filled 2018-12-20 (×7): qty 1

## 2018-12-20 MED ORDER — ACETAMINOPHEN 325 MG PO TABS
ORAL_TABLET | ORAL | Status: AC
  Filled 2018-12-20: qty 2

## 2018-12-20 MED ORDER — OXYCODONE HCL 5 MG PO TABS
5.0000 mg | ORAL_TABLET | Freq: Once | ORAL | Status: DC
  Filled 2018-12-20: qty 1

## 2018-12-20 NOTE — Consult Note (Signed)
[facility] Renal Consultation Note    Indication for Consultation:  Management of ESRD/hemodialysis, anemia, hypertension/volume, and secondary hyperparathyroidism. PCP:  HPI: [name] [name] is a 38 y.o. female with ESRD (s/p dual kidney-pancreas Tx in 2014 - resumed HD 04/2018 after rejection of kidney (still with pancreas function), gastroparesis who is being admitted with hyperkalemia and respiratory distress. Per ED notes had temp 102.3 at home today, w/ myalgias and chills. Missed HD Wed, did not get today either. Asked to see for renal failure.   In ED SpO2 on RA was in the 70's > improved on 3L [place]. RR 30, HR 118, BP 170/85.  K+ 6.6  CO2 18  BUN 81 and creat 11.52.  Alb 3.6. LFT's ok.  Trop <0.03.  WBC 9k  Hb 8.2 plt 171.    Patient tested negative for COVID today.    Patient describes dry cough, SOB and fevers, 2-3 days.  No prod cough or CP.  No abd pain.  Has RUA AVF  Had KP transplant in 2015. Went back on HD in 04/2018, pancreas is still working.   Dialyzes on MWF schedule at [facility]. Last HD 4/13. She missed her sessions on 4/15 and 4/17.   CXR today > IMPRESSION: Pulmonary vascular congestion with diffuse opacity, likely due to pulmonary edema with congestive heart failure. Note that there may be a degree of superimposed pneumonia and/or ARDS. More than one of these entities may exist concurrently.  CT abd / pelvis > IMPRESSION: 1. Findings in the lung bases are favored to reflect pulmonary edema from fluid overload, however, given the more dense consolidation in the left lower lobe, superimposed acute infection is not excluded, including viral pneumonia. 2. Small bilateral pleural effusions lying dependently. 3. Trace pericardial fluid and/or thickening, unlikely to be of any hemodynamic significance at this time. 4. No acute findings are noted in the abdomen or pelvis. 5. Trace amount of perinephric fluid adjacent to the patient's right transplant  kidney. The transplant kidney is otherwise normal in appearance. 6. Aortic atherosclerosis.      Past Medical History:  Diagnosis Date  . Anxiety   . Gastroparesis   . Narcotic abuse ([place])   . Non compliance with medical treatment   . Renal disorder   . Vision loss    Past Surgical History:  Procedure Laterality Date  . AV FISTULA PLACEMENT  11/17/2011   Procedure: ARTERIOVENOUS (AV) FISTULA CREATION;  Surgeon: Rosetta Posner, MD;  Location: [place];  Service: Vascular;  Laterality: Right;  . EYE SURGERY    . KIDNEY TRANSPLANT    . NEPHRECTOMY TRANSPLANTED ORGAN    . pancrease transplant    . REFRACTIVE SURGERY     Family History  Problem Relation Age of Onset  . Hypertension Father    Social History:  reports that she has never smoked. She has never used smokeless tobacco. She reports that she does not drink alcohol or use drugs.  ROS: As per HPI otherwise negative.  Review of Systems: Gen: +fever and chills, myalgias + HEENT: Denies blurred vision, sore throat or epistaxis. No sinusitis.   CV: Denies chest pain, angina, palpitations, orthopnea, PND, peripheral edema, and claudication. Resp: as above GI: Denies nausea, vomiting, abdominal pain, constipation or diarrhea. GU: Denies dysuria or hematuria. MS: Denies joint pain, muscle pain or cramps.  Derm: Denies rashes, itching, or ulcerations. Psych: Denies depression and anxiety. Heme: Denies bruising, bleeding, and enlarged lymph nodes. Neuro: No headache, dizziness, or  weakness. Endocrine: No hypoglycemia or thyroid disease.  Physical Exam: Vitals:   12/20/18 1315 12/20/18 1330 12/20/18 1400 12/20/18 1430  BP: (!) 182/91 (!) 192/90 (!) 185/88 (!) 178/87  Pulse: (!) 121 (!) 124 (!) 119 (!) 118  Resp: (!) 26 (!) 29 (!) 31 (!) 21  Temp:      TempSrc:      SpO2: 92% 93% 96% 94%     General: alert, not in distress, 96% on 2L , mild ^wob Head: Normocephalic, atraumatic, sclera non-icteric, mucus membranes are  moist. Neck: Supple without lymphadenopathy/masses. JVD not elevated. Lungs: bilateral insp crackles L > R 1/3 to 1/2 up post.  No wheezing.  Heart: RRR with normal S1, S2. Pounding PMI.  No gallop.  Abdomen: Soft, non-tender, non-distended with normoactive bowel sounds. No rebound/guarding. No obvious abdominal masses. Musculoskeletal:  Strength and tone appear normal for age. Lower extremities: No edema or ischemic changes, no open wounds. Neuro: Alert and oriented X 3. Moves all extremities spontaneously. Psych:  Responds to questions appropriately with a normal affect. Dialysis Access: RUA AVF +bruit no signs of infection  Allergies  Allergen Reactions  . Fish-Derived Products Swelling  . Shrimp [Shellfish Allergy] Rash   Prior to Admission medications   Medication Sig Start Date End Date Taking? Authorizing Provider  amLODipine (NORVASC) 10 MG tablet Take 10 mg by mouth daily. 09/19/18   [provider]  aspirin EC 81 MG tablet Take 81 mg by mouth every morning.    [provider]  cephALEXin (KEFLEX) 500 MG capsule Take 1 capsule (500 mg total) by mouth 2 (two) times daily. Patient not taking: Reported on 11/04/2018 08/21/16   Ripley Fraise, MD  cycloSPORINE modified (NEORAL) 100 MG capsule Take 200 mg by mouth 2 (two) times daily.     [provider]  metroNIDAZOLE (FLAGYL) 500 MG tablet Take 1 tablet (500 mg total) by mouth 2 (two) times daily. One po bid x 7 days 11/04/18   Jacqlyn Larsen, PA-C  mycophenolate (MYFORTIC) 180 MG EC tablet Take 540 mg by mouth 2 (two) times daily.    [provider]  oxyCODONE-acetaminophen (PERCOCET) 5-325 MG per tablet Take 1 tablet by mouth every 4 (four) hours as needed. Patient not taking: Reported on 01/12/2015 12/22/14   Noemi Chapel, MD  predniSONE (DELTASONE) 5 MG tablet Take 5 mg by mouth daily.  06/24/15   [provider]  promethazine (PHENERGAN) 25 MG suppository Place 1 suppository (25 mg  total) rectally every 6 (six) hours as needed for nausea or vomiting. Patient not taking: Reported on 09/07/2014 02/22/14   Evelina Bucy, MD   Current Facility-Administered Medications  Medication Dose Route Frequency Provider Last Rate Last Dose  . calcium gluconate inj 10% (1 g) URGENT USE ONLY!  1 g Intravenous Once Fawze, Mina A, PA-C      . [START ON 12/21/2018] Chlorhexidine Gluconate Cloth 2 % PADS 6 each  6 each Topical Q0600 Stovall, Kathryn R, PA-C      . insulin aspart (novoLOG) injection 5 Units  5 Units Intravenous Once Fawze, Mina A, PA-C       And  . dextrose 50 % solution 50 mL  1 ampule Intravenous Once Fawze, Mina A, PA-C      . sodium bicarbonate 100 mEq in dextrose 5 % 1,000 mL infusion   Intravenous Once Rodell Perna A, PA-C       Current Outpatient Medications  Medication Sig Dispense Refill  . amLODipine (  NORVASC) 10 MG tablet Take 10 mg by mouth daily.    Marland Kitchen aspirin EC 81 MG tablet Take 81 mg by mouth every morning.    . cephALEXin (KEFLEX) 500 MG capsule Take 1 capsule (500 mg total) by mouth 2 (two) times daily. (Patient not taking: Reported on 11/04/2018) 10 capsule 0  . cycloSPORINE modified (NEORAL) 100 MG capsule Take 200 mg by mouth 2 (two) times daily.     . metroNIDAZOLE (FLAGYL) 500 MG tablet Take 1 tablet (500 mg total) by mouth 2 (two) times daily. One po bid x 7 days 14 tablet 0  . mycophenolate (MYFORTIC) 180 MG EC tablet Take 540 mg by mouth 2 (two) times daily.    Marland Kitchen oxyCODONE-acetaminophen (PERCOCET) 5-325 MG per tablet Take 1 tablet by mouth every 4 (four) hours as needed. (Patient not taking: Reported on 01/12/2015) 10 tablet 0  . predniSONE (DELTASONE) 5 MG tablet Take 5 mg by mouth daily.   5  . promethazine (PHENERGAN) 25 MG suppository Place 1 suppository (25 mg total) rectally every 6 (six) hours as needed for nausea or vomiting. (Patient not taking: Reported on 09/07/2014) 12 each 0   Labs: Basic Metabolic Panel: Recent Labs  Lab 12/20/18 1318  NA  140  K 6.6*  CL 102  CO2 18*  GLUCOSE 86  BUN 81*  CREATININE 11.52*  CALCIUM 8.9   Liver Function Tests: Recent Labs  Lab 12/20/18 1318  AST 11*  ALT 15  ALKPHOS 57  BILITOT 0.6  PROT 6.9  ALBUMIN 3.6   No results for input(s): LIPASE, AMYLASE in the last 168 hours. No results for input(s): AMMONIA in the last 168 hours. CBC: Recent Labs  Lab 12/20/18 1318  WBC 9.8  NEUTROABS 7.4  HGB 8.2*  HCT 26.5*  MCV 99.3  PLT 171   Studies/Results: Dg Chest Portable 1 View  Result Date: 12/20/2018 CLINICAL DATA:  Shortness of breath.  Chronic renal failure.  Fever. EXAM: PORTABLE CHEST 1 VIEW COMPARISON:  Jan 29, 2014 FINDINGS: There is cardiomegaly with pulmonary venous hypertension. There is airspace opacity throughout the lungs, suspicious for alveolar edema. There is also a degree of underlying interstitial edema. No adenopathy. No bone lesions. IMPRESSION: Pulmonary vascular congestion with diffuse opacity, likely due to pulmonary edema with congestive heart failure. Note that there may be a degree of superimposed pneumonia and/or ARDS. More than one of these entities may exist concurrently. Electronically Signed   By: Lowella Grip III M.D.   On: 12/20/2018 14:26    Dialysis Orders:  MWF at [facility], 400/A1.5, EDW 63kg, 2K/2.25Ca, AVF, heparin 3000 bolus - Aranesp 153mcg IV weekly (last given 4/13)  Home meds:  - amlodipine 10 qd  - aspirin 81 qd  - cyclosporine 200 bid / mycophenolate 540 bid/ prednisone 5 qd  - prn's/ flagyl/ keflex  Assessment/Plan: 1.  Fever/ hypoxemia/ pulm infiltrates/ acute resp failure: COVID negative from ED labs. Possible PNA, significant disease by CXR/ CT. Started on IV abx. May have vol component but not grossly overloaded on exam and only up 4kg.   2. Hyperkalemia: EKG ok, no QRS widening.   3.  ESRD: on HD MWF.  Missed HD Wed. HD today.  4. H/o kidney-panc transplant - kidney failed, panc working on meds 5.  Hypertension/volume:  BP's up, 4kg over dry 6.  Anemia ckd: cont meds  7.  Metabolic bone disease: cont meds  8.  Hx kidney/pancreas transplant: Kidney has failed, pancreas still functional -  on Cyclosporine 200mg  in AM, 225mg  in PM, CellCept 540mg  BID, prednisone 5mg  QD.  Veneta Penton, PA-C 12/20/2018, 2:50 PM  [facility] Pager: [phone]  Pt seen, examined and agree w A/P as above.  [facility] 12/20/2018, 4:44 PM

## 2018-12-20 NOTE — ED Notes (Signed)
This RN only able to get one set of blood cultures, will have phlebotomy attempt.

## 2018-12-20 NOTE — Progress Notes (Signed)
Pt. Yelling and moaning with c/o hurting all over with shivering and chiills. HR 140's. SBP 195 Temp 103. Pt. Requesting to stop HD treatment. Dr. Posey Pronto telephoned and notified. Orders to d/c HD tx. And send pt. To assigned room orders for IV labetalol.

## 2018-12-20 NOTE — ED Notes (Signed)
Patient transported to CT by this RN 

## 2018-12-20 NOTE — ED Notes (Signed)
PA Mina at bed side, aware that sats were in the upper 70's until placed on 3L [place].

## 2018-12-20 NOTE — ED Notes (Signed)
Report given to [facility] in hemodialysis.

## 2018-12-20 NOTE — Progress Notes (Deleted)
Date: 12/20/2018               Patient Name:  [name] [name] MRN: [id number]  DOB: [birth year] Age / Sex: 38 y.o., female   PCP: Patient, No Pcp Per         Medical Service: Internal Medicine Teaching Service         Attending Physician: Dr. Rebeca Alert Raynaldo Opitz, MD    First Contact: Dr. Myrtie Hawk Pager: [phone]  Second Contact: Dr. Reesa Chew Pager: [phone]       After Hours (After 5p/  First Contact Pager: [phone]  weekends / holidays): Second Contact Pager: [phone]   Chief Complaint: Dry cough and flu like symptoms  History of Present Illness:  38 year old female with past medical history of ESRD on HD MWF (Failed kidney transplant), DM1(S/P pancreas transplant 2014 on immunosuppression) and, gastroparesis, presented with 3 days history of fever up to 102.3 F, chills, dry cough, SOB and worsening generalized body ache.  Her symptoms were also associated with nausea, nonbloody-nonbilious vomiting, and watery nonbloody diarrhea (2-3 times per day). Her last HD session was on Monday. She missed HD on Wednesday. Her symptoms got worse today. She reports worsening generalized weakness and was not able to walk. Her HD session today canceled due to her fever and she was send to the ED for further evaluation. She lives with her husband who has been asymptomatic.Denies any recent traveling and sick contact.  ED course: She is febrile, tachycardiac and tachypneic in respiratory distress and hypoxia. Her O2 sat got stable on 3-4 Li Nasal O2. Diffuse course breath sound, CXR with diffuse opacity and pulmonary edema. Her K was elevated at 6.6. Nephrology consulted and planned to do HD. Patient admitted at IM teaching service for further management.  Meds:  Current Meds  Medication Sig  . acetaminophen (TYLENOL) 500 MG tablet Take 1,000 mg by mouth every 6 (six) hours as needed for mild pain.  Marland Kitchen amLODipine (NORVASC) 10 MG tablet Take 10 mg by mouth daily.  Marland Kitchen aspirin EC 81 MG tablet Take 81 mg by mouth  every morning.  . cycloSPORINE modified (NEORAL) 100 MG capsule Take 200 mg by mouth 2 (two) times daily.   . mycophenolate (MYFORTIC) 180 MG EC tablet Take 540 mg by mouth 2 (two) times daily.  . predniSONE (DELTASONE) 5 MG tablet Take 5 mg by mouth daily.      Allergies: Allergies as of 12/20/2018 - Review Complete 12/20/2018  Allergen Reaction Noted  . Fish-derived products Swelling 12/20/2013  . Shrimp [shellfish allergy] Rash 11/17/2011   Past Medical History:  Diagnosis Date  . Anxiety   . Gastroparesis   . Narcotic abuse ([place])   . Non compliance with medical treatment   . Renal disorder   . Vision loss     Family History: Family History  Problem Relation Age of Onset  . Hypertension Father    Social History:  She does not smoke cigarettes She smokes hookah twice a week She denies illicit drug use She denies EtOH use  Review of Systems: A complete ROS was negative except as per HPI.  Physical Exam: Blood pressure (!) 167/75, pulse (!) 115, temperature 98.9 F (37.2 C), temperature source Oral, resp. rate (!) 22, weight 67.7 kg, last menstrual period 12/20/2018, SpO2 98 %.   Physical exam:  Physical Exam Constitutional:      General: She is not in acute distress.    Appearance: She is not ill-appearing.  HENT:  Head: Normocephalic and atraumatic.  Eyes:     Extraocular Movements: Extraocular movements intact.  Cardiovascular:     Rate and Rhythm: Regular rhythm. Tachycardia present.     Heart sounds: No murmur.  Pulmonary:     Effort: Pulmonary effort is normal. No respiratory distress.     Breath sounds: Rales present.  Musculoskeletal:     Right lower leg: No edema.     Left lower leg: No edema.  Skin:    General: Skin is dry.     Findings: No rash.  Neurological:     General: No focal deficit present.     Mental Status: She is alert and oriented to person, place, and time.  Psychiatric:        Mood and Affect: Mood normal.         Behavior: Behavior normal.        Judgment: Judgment normal.      Initial COVID-19 test came back negative  WBC: 9.8  CBC Latest Ref Rng & Units 12/20/2018 11/04/2018 02/13/2018  WBC 4.0 - 10.5 K/uL 9.8 4.7 -  Hemoglobin 12.0 - 15.0 g/dL 8.2(L) 9.4(L) 9.0(L)  Hematocrit 36.0 - 46.0 % 26.5(L) 31.2(L) -  Platelets 150 - 400 K/uL 171 166 -   EKG: personally reviewed my interpretation is sinus tachycardia, poor R progression, tall T wave at V3, V4, V5, II and aVF  CXR: personally reviewed my interpretation is diffuse opacity, pulmonary edema and vascular congestion.  CT Abdomen pelvoic:  1. No acute finding in abdomen or pelvis. 2.Pulmonary edema from fluid overload, however, given the more dense consolidation in the left lower lobe, superimposed acute infection is not excluded, including viral pneumonia. 3. Small bilateral pleural effusions lying dependently. 4. Trace pericardial fluid and/or thickening, unlikely to be of any hemodynamic significance at this time. Findings in the lung bases are favored to reflect pulmonary edema from fluid overload, however, given the more dense consolidation in the left lower lobe, superimposed acute infection is not excluded, including viral pneumonia.   Assessment & Plan by Problem: Active Problems:   CAP (community acquired pneumonia) 38 year old female with past medical history of ESRD on HD M WF,  ( kidney transplant in 2014--> Rejected), poorly controlled DM1 (S/P pancreas transplant 2015, on immunosuppression) presented with 2-3 days Hx of worsening generalized boday ache, weakness, dry cough, fever and chills, diarrhea and vomiting.     ESRD on HD MWD: She missed 2 HD sessions(Wednesday and today before arrival). In respiratory distress and hyperkalemia :K 6.6 on arrival. Nephrology consulted at ED and arranged emergent HD. Got Ca Gluconate and Insulin + D50  -Nephrology is on board, appreciate recommendation -BMP daily -No NSAID   Suspicious for COVID-19. She was febrile, hypoxic and on respiratory distress on arrival. CXR and CT with bilateral groundglass infiltrates. diffuse opacity and pulmonary edema. She is immunocompromised. Although initial COVID-19 test came back negative still concerning for COVID-19  -Contact and droplet precausion -Ceftriaxon IV 1 gr QD start date 12/20/2018 -Repeat COVID-19 test tomorrow -Check RVP -IV Ceftriaxone  -Azithromycin 500 mg QD q 24 h  -Repeat SARS-COVID-19 test tomorrow -Follow-up UA and UC -Initial COVID-19 negative. -Keep O2 sat greater 92% -If needs more than 4 li on nasal canula may consider airborn percussions until repeat OVOID comes back negative -Avoid NSAID -Strep Pneumonia urinary Ag -Tylenol 650 mg q6h PRN -Tylenol Suppository 650 rectal once -u/a -Lactic acid q 2 h -CBC with diff QD -Continous cardiac monitoring  DM I:  S/p pancreatic transplant at 89 and he is not on any medication.  -Continue home mycophenolate 540 mg p.o. twice daily -Continue home Prednisone 5 mg daily -Continue home Cyclosporin 200 mg BID -HbA1c -CBG monitoring  HTN: Hypertensive on arrival. Will get HD. Continuing home amlodipine. -Amlodipine 10 mg QD -Monitor BP  Diet: Renal/carb modified. IV fluid: None VTE ppx: Heparin IVF Code status: full  Dispo: Admit patient to Inpatient with expected length of stay greater than 2 midnights.  SignedDewayne Hatch, MD 12/20/2018, 6:53 PM  Pager: [phone]

## 2018-12-20 NOTE — ED Triage Notes (Signed)
Pt has been having abdominal pain, n/v/d today, fever up to 102.3 today, body aches and chills, pt missed dialysis Wednesday and when she went today they sent her here.

## 2018-12-20 NOTE — ED Provider Notes (Signed)
[facility] EMERGENCY DEPARTMENT Provider Note   CSN: [id number] Arrival date & time: 12/20/18  1252    History   Chief Complaint No chief complaint on file.   HPI [name] [name] is a 38 y.o. female with history of poorly controlled type 1 diabetes mellitus, status post pancreas and kidney transplant, gastroparesis, ESRD on dialysis Monday Wednesday Friday presents for evaluation of acute onset, progressively worsening fevers, generalized myalgias, nausea vomiting diarrhea, and cough for 3 days.  She reports fevers at home up to 102.81F.  She notes dry cough, shortness of breath, generalized chest pains and generalized abdominal pain.  Has had a few episodes of nonbloody nonbilious emesis daily and a few episodes of watery nonbloody diarrhea.  She does report she has been able to tolerate ice chips and her home medications.  She missed dialysis on Wednesday due to her symptoms and was sent from dialysis today to the ED for evaluation of her symptoms.  She smokes hookah, denies recreational drug use or alcohol use. She is full code.     The history is provided by the patient.    Past Medical History:  Diagnosis Date   Anxiety    Gastroparesis    Narcotic abuse ([place])    Non compliance with medical treatment    Renal disorder    Vision loss     Patient Active Problem List   Diagnosis Date Noted   End stage renal disease ([place]) 02/06/2012   Pleural effusion, bilateral 11/16/2011   Anemia in chronic kidney disease (CKD) 11/16/2011   Pericardial effusion 11/15/2011   Fluid overload 11/15/2011   Abdominal pain, epigastric 11/15/2011   CKD (chronic kidney disease) stage 5, GFR less than 15 ml/min ([place]) 11/03/2011   Hypertension 10/27/2011   Renal failure, chronic 10/27/2011   MEDIAL EPICONDYLITIS, LEFT 12/17/2007   ABSCESS, AXILLA, LEFT 12/03/2007   DIABETES MELLITUS, TYPE I, UNCONTROLLED 11/05/2007    Past Surgical History:  Procedure Laterality  Date   AV FISTULA PLACEMENT  11/17/2011   Procedure: ARTERIOVENOUS (AV) FISTULA CREATION;  Surgeon: Rosetta Posner, MD;  Location: [facility] OR;  Service: Vascular;  Laterality: Right;   EYE SURGERY     KIDNEY TRANSPLANT     NEPHRECTOMY TRANSPLANTED ORGAN     pancrease transplant     REFRACTIVE SURGERY       OB History   No obstetric history on file.      Home Medications    Prior to Admission medications   Medication Sig Start Date End Date Taking? Authorizing Provider  acetaminophen (TYLENOL) 500 MG tablet Take 1,000 mg by mouth every 6 (six) hours as needed for mild pain.   Yes [provider]  amLODipine (NORVASC) 10 MG tablet Take 10 mg by mouth daily. 09/19/18  Yes [provider]  aspirin EC 81 MG tablet Take 81 mg by mouth every morning.   Yes [provider]  cycloSPORINE modified (NEORAL) 100 MG capsule Take 200 mg by mouth 2 (two) times daily.    Yes [provider]  mycophenolate (MYFORTIC) 180 MG EC tablet Take 540 mg by mouth 2 (two) times daily.   Yes [provider]  predniSONE (DELTASONE) 5 MG tablet Take 5 mg by mouth daily.  06/24/15  Yes [provider]  cephALEXin (KEFLEX) 500 MG capsule Take 1 capsule (500 mg total) by mouth 2 (two) times daily. Patient not taking: Reported on 11/04/2018 08/21/16   Ripley Fraise, MD  metroNIDAZOLE (FLAGYL) 500  MG tablet Take 1 tablet (500 mg total) by mouth 2 (two) times daily. One po bid x 7 days Patient not taking: Reported on 12/20/2018 11/04/18   Jacqlyn Larsen, PA-C  oxyCODONE-acetaminophen (PERCOCET) 5-325 MG per tablet Take 1 tablet by mouth every 4 (four) hours as needed. Patient not taking: Reported on 01/12/2015 12/22/14   Noemi Chapel, MD  promethazine (PHENERGAN) 25 MG suppository Place 1 suppository (25 mg total) rectally every 6 (six) hours as needed for nausea or vomiting. Patient not taking: Reported on 09/07/2014 02/22/14   Evelina Bucy, MD    Family History Family  History  Problem Relation Age of Onset   Hypertension Father     Social History Social History   Tobacco Use   Smoking status: Never Smoker   Smokeless tobacco: Never Used   Tobacco comment: Hooka  Substance Use Topics   Alcohol use: No   Drug use: No     Allergies   Fish-derived products and Shrimp [shellfish allergy]   Review of Systems Review of Systems  Constitutional: Positive for chills and fever.  Respiratory: Positive for cough and shortness of breath.   Cardiovascular: Positive for chest pain.  Gastrointestinal: Positive for abdominal pain, diarrhea, nausea and vomiting.  All other systems reviewed and are negative.    Physical Exam Updated Vital Signs BP (!) 153/82    Pulse (!) 109    Temp 99 F (37.2 C) (Oral)    Resp (!) 21    Wt 67.7 kg    LMP 12/20/2018    SpO2 97%    BMI 27.30 kg/m   Physical Exam Vitals signs and nursing note reviewed.  Constitutional:      General: She is not in acute distress.    Appearance: She is well-developed. She is ill-appearing.     Comments: Writhing around bed, moaning  HENT:     Head: Normocephalic and atraumatic.  Eyes:     General:        Right eye: No discharge.        Left eye: No discharge.     Conjunctiva/sclera: Conjunctivae normal.  Neck:     Vascular: No JVD.     Trachea: No tracheal deviation.  Cardiovascular:     Rate and Rhythm: Tachycardia present.     Pulses: Normal pulses.  Pulmonary:     Effort: Respiratory distress present.     Comments: Diffuse coarse breath sounds, hypoxic on room air, stable SPO2 saturations on 3 to 4 L via nasal cannula Abdominal:     General: Abdomen is flat. A surgical scar is present. There is no distension.     Palpations: Abdomen is soft.     Tenderness: There is generalized abdominal tenderness. There is no guarding. Negative signs include Murphy's sign.     Comments: Large midline surgical scar.  Diffuse tenderness to palpation, no focal tenderness.  No  guarding.  Skin:    General: Skin is warm and dry.     Findings: No erythema.  Neurological:     Mental Status: She is alert.  Psychiatric:        Behavior: Behavior normal.      ED Treatments / Results  Labs (all labs ordered are listed, but only abnormal results are displayed) Labs Reviewed  COMPREHENSIVE METABOLIC PANEL - Abnormal; Notable for the following components:      Result Value   Potassium 6.6 (*)    CO2 18 (*)    BUN 81 (*)  Creatinine, Ser 11.52 (*)    AST 11 (*)    GFR calc non Af Amer 4 (*)    GFR calc Af Amer 4 (*)    Anion gap 20 (*)    All other components within normal limits  CBC WITH DIFFERENTIAL/PLATELET - Abnormal; Notable for the following components:   RBC 2.67 (*)    Hemoglobin 8.2 (*)    HCT 26.5 (*)    Abs Immature Granulocytes 0.10 (*)    All other components within normal limits  I-STAT BETA HCG BLOOD, ED ([facility], [facility], [facility] ONLY) - Abnormal; Notable for the following components:   I-stat hCG, quantitative 10.0 (*)    All other components within normal limits  CULTURE, BLOOD (ROUTINE X 2)  SARS CORONAVIRUS 2 (HOSPITAL ORDER, [facility])  CULTURE, BLOOD (ROUTINE X 2)  RESPIRATORY PANEL BY PCR  LACTIC ACID, PLASMA  PROTIME-INR  TROPONIN I  LACTIC ACID, PLASMA  URINALYSIS, ROUTINE W REFLEX MICROSCOPIC  ABO/RH    EKG EKG Interpretation  Date/Time:  Friday December 20 2018 13:59:09 EDT Ventricular Rate:  118 PR Interval:    QRS Duration: 87 QT Interval:  321 QTC Calculation: 450 R Axis:   55 Text Interpretation:  Sinus tachycardia Low voltage, extremity leads Borderline repolarization abnormality increased t waves, possible hyperkalemia compared with prior 8/13 Confirmed by Aletta Edouard [id number]) on 12/20/2018 2:08:26 PM Also confirmed by Aletta Edouard [phone]), editor Philomena Doheny [id number])  on 12/20/2018 3:22:18 PM   Radiology Ct Abdomen Pelvis W Contrast  Result Date: 12/20/2018 CLINICAL DATA:  38 year old  female with history of renal transplant on dialysis since August 2019. Abdominal pain. Nausea, vomiting and diarrhea today. Fever to 102.3 degrees. Body aches and chills. Possible COVID-19. EXAM: CT ABDOMEN AND PELVIS WITH CONTRAST TECHNIQUE: Multidetector CT imaging of the abdomen and pelvis was performed using the standard protocol following bolus administration of intravenous contrast. CONTRAST:  149mL OMNIPAQUE IOHEXOL 300 MG/ML  SOLN COMPARISON:  CT the abdomen and pelvis 01/26/2014. FINDINGS: Lower chest: Small bilateral pleural effusions lying dependently. Trace amount of pericardial fluid and/or thickening, unlikely to be of any hemodynamic significance at this time. Widespread areas of ground-glass attenuation throughout the lung bases, new compared to the prior examination. This is accompanied by interlobular septal thickening. Areas of more dense consolidation are also noted in the posterior aspect of the left lower lobe. Several more nodular areas of ground-glass attenuation are also noted throughout the visualize lung bases. Hepatobiliary: No suspicious cystic or solid hepatic lesions. No intra or extrahepatic biliary ductal dilatation. Gallbladder is normal in appearance. Pancreas: No pancreatic mass. Atrophy in the distal body and tail of the pancreas. No pancreatic ductal dilatation. No pancreatic or peripancreatic fluid or inflammatory changes. Spleen: Unremarkable. Adrenals/Urinary Tract: Severe atrophy of the native kidneys bilaterally. Bilateral adrenal glands are normal in appearance. No hydroureteronephrosis. Urinary bladder is normal in appearance. Transplant kidney in the right lower quadrant with trace amount of perinephric fluid. Stomach/Bowel: Normal appearance of the stomach. No pathologic dilatation of small bowel or colon. Normal appendix. Vascular/Lymphatic: Aortic atherosclerosis, without evidence of aneurysm or dissection in the abdominal or pelvic vasculature. No lymphadenopathy  noted in the abdomen or pelvis. Reproductive: Exophytic 1.6 cm enhancing lesion in the anterior aspect of the uterine fundus, presumably a small fibroid. Ovaries are unremarkable in appearance. Other: Trace volume of free fluid in the low anatomic pelvis. Trace amount of fluid adjacent to the transplant kidney in the right lower quadrant.  No larger volume of ascites. No pneumoperitoneum. Musculoskeletal: There are no aggressive appearing lytic or blastic lesions noted in the visualized portions of the skeleton. IMPRESSION: 1. Findings in the lung bases are favored to reflect pulmonary edema from fluid overload, however, given the more dense consolidation in the left lower lobe, superimposed acute infection is not excluded, including viral pneumonia. 2. Small bilateral pleural effusions lying dependently. 3. Trace pericardial fluid and/or thickening, unlikely to be of any hemodynamic significance at this time. 4. No acute findings are noted in the abdomen or pelvis. 5. Trace amount of perinephric fluid adjacent to the patient's right transplant kidney. The transplant kidney is otherwise normal in appearance. 6. Aortic atherosclerosis. 7. Additional incidental findings, as above. Critical Value/emergent results were called by telephone at the time of interpretation on 12/20/2018 at 3:19 pm to Dr. Rodell Perna, who verbally acknowledged these results. Electronically Signed   By: Vinnie Langton M.D.   On: 12/20/2018 15:23   Dg Chest Portable 1 View  Result Date: 12/20/2018 CLINICAL DATA:  Shortness of breath.  Chronic renal failure.  Fever. EXAM: PORTABLE CHEST 1 VIEW COMPARISON:  Jan 29, 2014 FINDINGS: There is cardiomegaly with pulmonary venous hypertension. There is airspace opacity throughout the lungs, suspicious for alveolar edema. There is also a degree of underlying interstitial edema. No adenopathy. No bone lesions. IMPRESSION: Pulmonary vascular congestion with diffuse opacity, likely due to pulmonary edema  with congestive heart failure. Note that there may be a degree of superimposed pneumonia and/or ARDS. More than one of these entities may exist concurrently. Electronically Signed   By: Lowella Grip III M.D.   On: 12/20/2018 14:26    Procedures .Critical Care Performed by: Renita Papa, PA-C Authorized by: Renita Papa, PA-C   Critical care provider statement:    Critical care time (minutes):  45   Critical care was necessary to treat or prevent imminent or life-threatening deterioration of the following conditions:  Respiratory failure and metabolic crisis   Critical care was time spent personally by me on the following activities:  Discussions with consultants, evaluation of patient's response to treatment, examination of patient, ordering and performing treatments and interventions, ordering and review of laboratory studies, ordering and review of radiographic studies, pulse oximetry, re-evaluation of patient's condition, obtaining history from patient or surrogate and review of old charts   I assumed direction of critical care for this patient from another provider in my specialty: no     (including critical care time)  Medications Ordered in ED Medications  sodium bicarbonate 100 mEq in dextrose 5 % 1,000 mL infusion ( Intravenous New Bag/Given 12/20/18 1650)  Chlorhexidine Gluconate Cloth 2 % PADS 6 each (has no administration in time range)  cefTRIAXone (ROCEPHIN) 1 g in sodium chloride 0.9 % 100 mL IVPB (has no administration in time range)  azithromycin (ZITHROMAX) 500 mg in sodium chloride 0.9 % 250 mL IVPB (has no administration in time range)  sodium chloride flush (NS) 0.9 % injection 3 mL (3 mLs Intravenous Given 12/20/18 1330)  acetaminophen (TYLENOL) suppository 650 mg (650 mg Rectal Given 12/20/18 1320)  insulin aspart (novoLOG) injection 5 Units (5 Units Intravenous Given 12/20/18 1541)    And  dextrose 50 % solution 50 mL (50 mLs Intravenous Given 12/20/18 1541)    calcium gluconate inj 10% (1 g) URGENT USE ONLY! (1 g Intravenous Given 12/20/18 1541)  iohexol (OMNIPAQUE) 300 MG/ML solution 100 mL (100 mLs Intravenous Contrast Given 12/20/18 1455)  HYDROmorphone (DILAUDID)  injection 1 mg (1 mg Intravenous Given 12/20/18 1539)  metoCLOPramide (REGLAN) injection 10 mg (10 mg Intravenous Given 12/20/18 1541)     Initial Impression / Assessment and Plan / ED Course  I have reviewed the triage vital signs and the nursing notes.  Pertinent labs & imaging results that were available during my care of the patient were reviewed by me and considered in my medical decision making (see chart for details).  Patient presenting with fevers, cough, shortness of breath, nausea, vomiting, diarrhea for 3 days.  Has missed 2 dialysis treatments.  Febrile, tachycardic, hypertensive, hypoxic on initial presentation, improved on 2 L via nasal cannula.  Appears uncomfortable but no peritoneal signs on examination of the abdomen.  Nonfocal pain to the chest and abdomen.  Lab work reviewed by me significant for hyperkalemia with peaked T waves on EKG. portable chest x-ray shows edema with possible superimposed pneumonia/ARDS.  Will obtain COVID testing in the ED.   2:40 PM Spoke with Dr. Jonnie Finner with nephrology who recommends giving insulin and dextrose, sodium bicarb, 1 amp calcium gluconate for management of the patient's hyperkalemia.  Awaiting COVID testing but she will require dialysis more emergently while in the hospital.  Imaging of the abdomen and pelvis shows no evidence of acute intra-abdominal pathology but does show findings consistent with volume overload in the lung bases.  Possible infectious process as well.  Her COVID test is negative.  Vitals improved with Tylenol and pain medicine.  Internal medicine teaching service to admit, they request addition of respiratory virus panel for further evaluation of her symptoms.   Jaeden Fenlon was evaluated in Emergency Department  on 12/20/2018 for the symptoms described in the history of present illness. She was evaluated in the context of the global COVID-19 pandemic, which necessitated consideration that the patient might be at risk for infection with the SARS-CoV-2 virus that causes COVID-19. Institutional protocols and algorithms that pertain to the evaluation of patients at risk for COVID-19 are in a state of rapid change based on information released by regulatory bodies including the CDC and federal and state organizations. These policies and algorithms were followed during the patient's care in the ED.   Final Clinical Impressions(s) / ED Diagnoses   Final diagnoses:  Hypoxia  Hyperkalemia    ED Discharge Orders    None       Renita Papa, PA-C 12/20/18 1700    Hayden Rasmussen, MD 12/23/18 [phone]

## 2018-12-20 NOTE — Progress Notes (Addendum)
Subjective: Paged by RN that patient was febrile to 103, endorsing diffuse body pain, and required an increase in her supplemental oxygen from 4L to 8L high flow. Evaluated the patient at bedside.   Objective: On exam, patient is tachycardic to 120 and tachypneic. Her work of breathing is increased, but she doesn't appear distressed. She is satting in the mid to high 90s on 8L high flow. Her blood pressure is elevated in the 015I systolic. She has bilateral lung crackles on auscultation.   - Repeat CXR shows persistent airspace opacities throughout the lungs. She had 2.1L of fluid removed at HD earlier in the evening.   - ABG shows pH 7.4 and paO2 66 - Repeat CBC shows increase in WBC count from 9.8 to 11.4  - Lactic acid remains wnl  Assessment: High suspicion of Covid-19 despite initial negative test. Patient is at increased risk for severe disease due to her immunocompromised status. P/F ratio is currently 132, consistent with moderate severity ARDS. Case discussed with Dr. Jimmy Footman of critical care, who advised proning the patient to improve saturations and re-consulting PCCM if patient decompensates.   Plan - Repeat Covid-19 testing. Discussed medical necessity of repeat testing with microbiology. - Maintain airborne and contact precautions  - Prone patient - Continue supplemental oxygen  - Dilaudid 0.5mg  for pain - If Covid negative, broaden from ceftriaxone to cefepime   Addendum: Repeat Covid test negative. Broadening HCAP abx to azithromycin and cefepime.

## 2018-12-21 DIAGNOSIS — J189 Pneumonia, unspecified organism: Secondary | ICD-10-CM | POA: Diagnosis present

## 2018-12-21 DIAGNOSIS — D849 Immunodeficiency, unspecified: Secondary | ICD-10-CM | POA: Diagnosis present

## 2018-12-21 DIAGNOSIS — D72829 Elevated white blood cell count, unspecified: Secondary | ICD-10-CM

## 2018-12-21 DIAGNOSIS — E875 Hyperkalemia: Secondary | ICD-10-CM

## 2018-12-21 DIAGNOSIS — Z9115 Patient's noncompliance with renal dialysis: Secondary | ICD-10-CM

## 2018-12-21 DIAGNOSIS — R0603 Acute respiratory distress: Secondary | ICD-10-CM

## 2018-12-21 DIAGNOSIS — Z992 Dependence on renal dialysis: Secondary | ICD-10-CM

## 2018-12-21 DIAGNOSIS — T8612 Kidney transplant failure: Secondary | ICD-10-CM

## 2018-12-21 DIAGNOSIS — R079 Chest pain, unspecified: Secondary | ICD-10-CM | POA: Diagnosis present

## 2018-12-21 DIAGNOSIS — K3184 Gastroparesis: Secondary | ICD-10-CM

## 2018-12-21 DIAGNOSIS — Z9483 Pancreas transplant status: Secondary | ICD-10-CM

## 2018-12-21 DIAGNOSIS — R918 Other nonspecific abnormal finding of lung field: Secondary | ICD-10-CM

## 2018-12-21 DIAGNOSIS — R509 Fever, unspecified: Secondary | ICD-10-CM

## 2018-12-21 DIAGNOSIS — D899 Disorder involving the immune mechanism, unspecified: Secondary | ICD-10-CM

## 2018-12-21 DIAGNOSIS — N186 End stage renal disease: Secondary | ICD-10-CM

## 2018-12-21 DIAGNOSIS — Z79899 Other long term (current) drug therapy: Secondary | ICD-10-CM

## 2018-12-21 DIAGNOSIS — Z7952 Long term (current) use of systemic steroids: Secondary | ICD-10-CM

## 2018-12-21 DIAGNOSIS — Z8639 Personal history of other endocrine, nutritional and metabolic disease: Secondary | ICD-10-CM

## 2018-12-21 DIAGNOSIS — Z91013 Allergy to seafood: Secondary | ICD-10-CM

## 2018-12-21 DIAGNOSIS — R0902 Hypoxemia: Secondary | ICD-10-CM

## 2018-12-21 LAB — SEDIMENTATION RATE: Sed Rate: 51 mm/hr — ABNORMAL HIGH (ref 0–22)

## 2018-12-21 LAB — BASIC METABOLIC PANEL
Anion gap: 16 — ABNORMAL HIGH (ref 5–15)
BUN: 39 mg/dL — ABNORMAL HIGH (ref 6–20)
CO2: 25 mmol/L (ref 22–32)
Calcium: 7.8 mg/dL — ABNORMAL LOW (ref 8.9–10.3)
Chloride: 96 mmol/L — ABNORMAL LOW (ref 98–111)
Creatinine, Ser: 6.82 mg/dL — ABNORMAL HIGH (ref 0.44–1.00)
GFR calc Af Amer: 8 mL/min — ABNORMAL LOW (ref >60)
GFR calc non Af Amer: 7 mL/min — ABNORMAL LOW (ref >60)
Glucose, Bld: 90 mg/dL (ref 70–99)
Potassium: 5.8 mmol/L — ABNORMAL HIGH (ref 3.5–5.1)
Sodium: 137 mmol/L (ref 135–145)

## 2018-12-21 LAB — CBC WITH DIFFERENTIAL/PLATELET
Abs Immature Granulocytes: 0.09 10*3/uL — ABNORMAL HIGH (ref 0.00–0.07)
Basophils Absolute: 0 10*3/uL (ref 0.0–0.1)
Basophils Relative: 0 %
Eosinophils Absolute: 0.1 10*3/uL (ref 0.0–0.5)
Eosinophils Relative: 1 %
HCT: 24.1 % — ABNORMAL LOW (ref 36.0–46.0)
Hemoglobin: 7.3 g/dL — ABNORMAL LOW (ref 12.0–15.0)
Immature Granulocytes: 1 %
Lymphocytes Relative: 16 %
Lymphs Abs: 2 10*3/uL (ref 0.7–4.0)
MCH: 29.6 pg (ref 26.0–34.0)
MCHC: 30.3 g/dL (ref 30.0–36.0)
MCV: 97.6 fL (ref 80.0–100.0)
Monocytes Absolute: 0.8 10*3/uL (ref 0.1–1.0)
Monocytes Relative: 6 %
Neutro Abs: 9.6 10*3/uL — ABNORMAL HIGH (ref 1.7–7.7)
Neutrophils Relative %: 76 %
Platelets: 158 10*3/uL (ref 150–400)
RBC: 2.47 MIL/uL — ABNORMAL LOW (ref 3.87–5.11)
RDW: 15.9 % — ABNORMAL HIGH (ref 11.5–15.5)
WBC: 12.6 10*3/uL — ABNORMAL HIGH (ref 4.0–10.5)
nRBC: 0 % (ref 0.0–0.2)

## 2018-12-21 LAB — URINALYSIS, ROUTINE W REFLEX MICROSCOPIC
Bilirubin Urine: NEGATIVE
Glucose, UA: 50 mg/dL — AB
Ketones, ur: NEGATIVE mg/dL
Nitrite: NEGATIVE
Protein, ur: 100 mg/dL — AB
Specific Gravity, Urine: 1.014 (ref 1.005–1.030)
pH: 8 (ref 5.0–8.0)

## 2018-12-21 LAB — STREP PNEUMONIAE URINARY ANTIGEN: Strep Pneumo Urinary Antigen: NEGATIVE

## 2018-12-21 LAB — RENAL FUNCTION PANEL
Albumin: 3.1 g/dL — ABNORMAL LOW (ref 3.5–5.0)
Anion gap: 17 — ABNORMAL HIGH (ref 5–15)
BUN: 35 mg/dL — ABNORMAL HIGH (ref 6–20)
CO2: 25 mmol/L (ref 22–32)
Calcium: 7.9 mg/dL — ABNORMAL LOW (ref 8.9–10.3)
Chloride: 93 mmol/L — ABNORMAL LOW (ref 98–111)
Creatinine, Ser: 6.39 mg/dL — ABNORMAL HIGH (ref 0.44–1.00)
GFR calc Af Amer: 9 mL/min — ABNORMAL LOW (ref >60)
GFR calc non Af Amer: 8 mL/min — ABNORMAL LOW (ref >60)
Glucose, Bld: 95 mg/dL (ref 70–99)
Phosphorus: 6.2 mg/dL — ABNORMAL HIGH (ref 2.5–4.6)
Potassium: 6.5 mmol/L (ref 3.5–5.1)
Sodium: 135 mmol/L (ref 135–145)

## 2018-12-21 LAB — CK: Total CK: 120 U/L (ref 38–234)

## 2018-12-21 LAB — D-DIMER, QUANTITATIVE: D-Dimer, Quant: 2.13 ug/mL-FEU — ABNORMAL HIGH (ref 0.00–0.50)

## 2018-12-21 LAB — C-REACTIVE PROTEIN: CRP: 12.1 mg/dL — ABNORMAL HIGH (ref <1.0)

## 2018-12-21 LAB — HEMOGLOBIN A1C
Hgb A1c MFr Bld: 4.6 % — ABNORMAL LOW (ref 4.8–5.6)
Mean Plasma Glucose: 85.32 mg/dL

## 2018-12-21 LAB — FERRITIN: Ferritin: 302 ng/mL (ref 11–307)

## 2018-12-21 LAB — PROCALCITONIN: Procalcitonin: <0.1 ng/mL

## 2018-12-21 LAB — SARS CORONAVIRUS 2 BY RT PCR (HOSPITAL ORDER, PERFORMED IN ~~LOC~~ HOSPITAL LAB): SARS Coronavirus 2: NEGATIVE

## 2018-12-21 LAB — GLUCOSE, CAPILLARY: Glucose-Capillary: 88 mg/dL (ref 70–99)

## 2018-12-21 LAB — TROPONIN I: Troponin I: 0.07 ng/mL (ref <0.03)

## 2018-12-21 MED ORDER — CHLORHEXIDINE GLUCONATE CLOTH 2 % EX PADS: 6.0000 | MEDICATED_PAD | Freq: Every day | CUTANEOUS | Status: DC

## 2018-12-21 MED ORDER — SODIUM CHLORIDE 0.9 % IV SOLN: 100.0000 mL | INTRAVENOUS | Status: DC | PRN

## 2018-12-21 MED ORDER — HYDROMORPHONE HCL 1 MG/ML IJ SOLN
0.5000 mg | INTRAMUSCULAR | Status: DC | PRN
  Administered 2018-12-21: 1 mg via INTRAVENOUS
  Administered 2018-12-21: 0.5 mg via INTRAVENOUS
  Filled 2018-12-21 (×3): qty 1

## 2018-12-21 MED ORDER — LIDOCAINE-PRILOCAINE 2.5-2.5 % EX CREA
1.0000 "application " | TOPICAL_CREAM | CUTANEOUS | Status: DC | PRN
  Filled 2018-12-21: qty 5

## 2018-12-21 MED ORDER — SODIUM CHLORIDE 0.9 % IV SOLN
2.0000 g | INTRAVENOUS | Status: DC
  Administered 2018-12-21: 2 g via INTRAVENOUS
  Filled 2018-12-21 (×3): qty 2

## 2018-12-21 MED ORDER — VANCOMYCIN HCL 10 G IV SOLR
1500.0000 mg | INTRAVENOUS | Status: AC
  Administered 2018-12-21: 1500 mg via INTRAVENOUS
  Filled 2018-12-21: qty 1500

## 2018-12-21 MED ORDER — HEPARIN SODIUM (PORCINE) 1000 UNIT/ML IJ SOLN
INTRAMUSCULAR | Status: AC
  Administered 2018-12-21: 3000 [IU] via INTRAVENOUS_CENTRAL
  Filled 2018-12-21: qty 3

## 2018-12-21 MED ORDER — VANCOMYCIN HCL IN DEXTROSE 750-5 MG/150ML-% IV SOLN
750.0000 mg | INTRAVENOUS | Status: DC
  Administered 2018-12-23 – 2018-12-25 (×2): 750 mg via INTRAVENOUS
  Filled 2018-12-21 (×2): qty 150

## 2018-12-21 MED ORDER — HEPARIN SODIUM (PORCINE) 1000 UNIT/ML DIALYSIS
1000.0000 [IU] | INTRAMUSCULAR | Status: DC | PRN
  Filled 2018-12-21: qty 1

## 2018-12-21 MED ORDER — HYDROMORPHONE HCL 1 MG/ML IJ SOLN
0.5000 mg | INTRAMUSCULAR | Status: DC | PRN
  Administered 2018-12-21 – 2018-12-22 (×5): 1 mg via INTRAVENOUS
  Administered 2018-12-22 (×5): 0.5 mg via INTRAVENOUS
  Administered 2018-12-23 – 2018-12-24 (×8): 1 mg via INTRAVENOUS
  Filled 2018-12-21 (×17): qty 1

## 2018-12-21 MED ORDER — HEPARIN SODIUM (PORCINE) 1000 UNIT/ML DIALYSIS
3000.0000 [IU] | Freq: Once | INTRAMUSCULAR | Status: AC
  Administered 2018-12-21: 3000 [IU] via INTRAVENOUS_CENTRAL
  Filled 2018-12-21: qty 3

## 2018-12-21 MED ORDER — PENTAFLUOROPROP-TETRAFLUOROETH EX AERO: 1.0000 "application " | INHALATION_SPRAY | CUTANEOUS | Status: DC | PRN

## 2018-12-21 MED ORDER — LIDOCAINE HCL (PF) 1 % IJ SOLN: 5.0000 mL | INTRAMUSCULAR | Status: DC | PRN

## 2018-12-21 NOTE — H&P (Addendum)
Date: 12/20/2018               Patient Name:  [name] [name] MRN: [id number]  DOB: [birth year] Age / Sex: 38 y.o., female   PCP: Patient, No Pcp Per         Medical Service: Internal Medicine Teaching Service         Attending Physician: Dr. Rebeca Alert Raynaldo Opitz, MD    First Contact: Dr. Myrtie Hawk Pager: [phone]  Second Contact: Dr. 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  Her symptoms were also associated with nausea, nonbloody-nonbilious vomiting, and watery nonbloody diarrhea (2-3 times per day). Her last HD session was on Monday. She missed HD on Wednesday. Her symptoms got worse today. She reports worsening generalized weakness and was not able to walk. Her HD session today canceled due to her fever and she was send to the ED for further evaluation. She lives with her husband who has been asymptomatic.Denies any recent traveling and sick contact.  ED course: She is febrile, tachycardiac and tachypneic in respiratory distress and hypoxia. Her O2 sat got stable on 3-4 Li Nasal O2. Diffuse course breath sound, CXR with diffuse opacity and pulmonary edema. Her K was elevated at 6.6. Nephrology consulted and planned to do HD. Patient admitted at IM teaching service for further management.  Meds:  Current Meds  Medication Sig  . acetaminophen (TYLENOL) 500 MG tablet Take 1,000 mg by mouth every 6 (six) hours as needed for mild pain.  Marland Kitchen amLODipine (NORVASC) 10 MG tablet Take 10 mg by mouth daily.  Marland Kitchen aspirin EC 81 MG tablet Take 81 mg by mouth  every morning.  . cycloSPORINE modified (NEORAL) 100 MG capsule Take 200 mg by mouth 2 (two) times daily.   . mycophenolate (MYFORTIC) 180 MG EC tablet Take 540 mg by mouth 2 (two) times daily.  . predniSONE (DELTASONE) 5 MG tablet Take 5 mg by mouth daily.      Allergies: Allergies as of 12/20/2018 - Review Complete 12/20/2018  Allergen Reaction Noted  . Fish-derived products Swelling 12/20/2013  . Shrimp [shellfish allergy] Rash 11/17/2011   Past Medical History:  Diagnosis Date  . Anxiety   . Gastroparesis   . Narcotic abuse ([place])   . Non compliance with medical treatment   . Renal disorder   . Vision loss     Family History: Family History  Problem Relation Age of Onset  . Hypertension Father    Social History:  She does not smoke cigarettes She smokes hookah twice a week She denies illicit drug use She denies EtOH use  Review of Systems: A complete ROS was negative except as per HPI.  Physical Exam: Blood pressure (!) 167/75, pulse (!) 115, temperature 98.9 F (37.2 C), temperature source Oral, resp. rate (!) 22, weight 67.7 kg, last menstrual period 12/20/2018, SpO2 98 %.   Physical exam:  Physical Exam Constitutional:      General: She is not in acute distress.    Appearance: She is not ill-appearing.  HENT:  Head: Normocephalic and atraumatic.  Eyes:     Extraocular Movements: Extraocular movements intact.  Cardiovascular:     Rate and Rhythm: Regular rhythm. Tachycardia present.     Heart sounds: No murmur.  Pulmonary:     Effort: Pulmonary effort is normal. No respiratory distress.     Breath sounds: Rales present.  Musculoskeletal:     Right lower leg: No edema.     Left lower leg: No edema.  Skin:    General: Skin is dry.     Findings: No rash.  Neurological:     General: No focal deficit present.     Mental Status: She is alert and oriented to person, place, and time.  Psychiatric:        Mood and Affect: Mood normal.         Behavior: Behavior normal.        Judgment: Judgment normal.      Initial COVID-19 test came back negative  WBC: 9.8  CBC Latest Ref Rng & Units 12/20/2018 11/04/2018 02/13/2018  WBC 4.0 - 10.5 K/uL 9.8 4.7 -  Hemoglobin 12.0 - 15.0 g/dL 8.2(L) 9.4(L) 9.0(L)  Hematocrit 36.0 - 46.0 % 26.5(L) 31.2(L) -  Platelets 150 - 400 K/uL 171 166 -   EKG: personally reviewed my interpretation is sinus tachycardia, poor R progression, tall T wave at V3, V4, V5, II and aVF  CXR: personally reviewed my interpretation is diffuse opacity, pulmonary edema and vascular congestion.  CT Abdomen pelvoic:  1. No acute finding in abdomen or pelvis. 2.Pulmonary edema from fluid overload, however, given the more dense consolidation in the left lower lobe, superimposed acute infection is not excluded, including viral pneumonia. 3. Small bilateral pleural effusions lying dependently. 4. Trace pericardial fluid and/or thickening, unlikely to be of any hemodynamic significance at this time. Findings in the lung bases are favored to reflect pulmonary edema from fluid overload, however, given the more dense consolidation in the left lower lobe, superimposed acute infection is not excluded, including viral pneumonia.   Assessment & Plan by Problem: Active Problems:   CAP (community acquired pneumonia) 38 year old female with past medical history of ESRD on HD M WF,  ( kidney transplant in 2014--> Rejected), poorly controlled DM1 (S/P pancreas transplant 2015, on immunosuppression) presented with 2-3 days Hx of worsening generalized boday ache, weakness, dry cough, fever and chills, diarrhea and vomiting.     ESRD on HD MWD: She missed 2 HD sessions(Wednesday and today before arrival). In respiratory distress and hyperkalemia :K 6.6 on arrival. Nephrology consulted at ED and arranged emergent HD. Got Ca Gluconate and Insulin + D50  -Nephrology is on board, appreciate recommendation -BMP daily -No NSAID   Suspicious for COVID-19. She was febrile, hypoxic and on respiratory distress on arrival. CXR and CT with bilateral groundglass infiltrates. diffuse opacity and pulmonary edema. She is immunocompromised. Although initial COVID-19 test came back negative still concerning for COVID-19  -Contact and droplet precausion -Ceftriaxon IV 1 gr QD start date 12/20/2018 -Repeat COVID-19 test tomorrow -Check RVP -IV Ceftriaxone  -Azithromycin 500 mg QD q 24 h  -Repeat SARS-COVID-19 test tomorrow -Follow-up UA and UC -Initial COVID-19 negative. -Keep O2 sat greater 92% -If needs more than 4 li on nasal canula may consider airborn percussions until repeat OVOID comes back negative -Avoid NSAID -Strep Pneumonia urinary Ag -Tylenol 650 mg q6h PRN -Tylenol Suppository 650 rectal once -u/a -Lactic acid q 2 h -CBC with diff QD -Continous cardiac monitoring  DM I:  S/p pancreatic transplant at 6 and he is not on any medication.  -Continue home mycophenolate 540 mg p.o. twice daily -Continue home Prednisone 5 mg daily -Continue home Cyclosporin 200 mg BID -HbA1c -CBG monitoring  HTN: Hypertensive on arrival. Will get HD. Continuing home amlodipine. -Amlodipine 10 mg QD -Monitor BP  Diet: Renal/carb modified. IV fluid: None VTE ppx: Heparin IVF Code status: full  Dispo: Admit patient to Inpatient with expected length of stay greater than 2 midnights.  SignedDewayne Hatch, MD 12/20/2018, 6:53 PM  Pager: [phone]

## 2018-12-21 NOTE — Progress Notes (Signed)
Pharmacy Antibiotic Note  [name] [name] is a 38 y.o. female admitted on 12/20/2018 with pneumonia.  Pharmacy has been consulted to broaden ABX to cefepime.  Plan: Cefepime 2g IV now and after each HD.  Weight: 149 lb 4 oz (67.7 kg)  Temp (24hrs), Avg:101.2 F (38.4 C), Min:98.9 F (37.2 C), Max:103 F (39.4 C)  Recent Labs  Lab 12/20/18 1318 12/20/18 2323  WBC 9.8 11.4*  CREATININE 11.52* 5.65*  LATICACIDVEN 1.4 1.7    Estimated Creatinine Clearance: 12.4 mL/min (A) (by C-G formula based on SCr of 5.65 mg/dL (H)).    Allergies  Allergen Reactions  . Fish-Derived Products Swelling  . Shrimp [Shellfish Allergy] Rash    Thank you for allowing pharmacy to be a part of this patient's care.  Wynona Neat, PharmD, BCPS  12/21/2018 1:20 AM

## 2018-12-21 NOTE — Progress Notes (Signed)
Pharmacy Antibiotic Note  [name] [name] is a 38 y.o. female admitted on 12/20/2018 with pneumonia.  Pharmacy has been consulted to broaden ABX to cefepime and vancomycin.  Patient's last dialysis was 4/17 inpatient.  Pt continues to be febrile, WBC trending up, increased O2 requirement.  Plan: Cefepime 2g IV now and after each HD. Vancomycin 1500mg  IV x 1 now. Vancomycin 750mg  IV after each HD.  Weight: 149 lb 4 oz (67.7 kg)  Temp (24hrs), Avg:101.2 F (38.4 C), Min:98.9 F (37.2 C), Max:103.1 F (39.5 C)  Recent Labs  Lab 12/20/18 1318 12/20/18 2323 12/21/18 0539  WBC 9.8 11.4* 12.6*  CREATININE 11.52* 5.65*  --   LATICACIDVEN 1.4 1.7  --     Estimated Creatinine Clearance: 12.4 mL/min (A) (by C-G formula based on SCr of 5.65 mg/dL (H)).    Allergies  Allergen Reactions  . Fish-Derived Products Swelling  . Shrimp [Shellfish Allergy] Rash    Thank you for allowing pharmacy to be a part of this patient's care.  [employer], Pharm.D., BCPS Clinical Pharmacist  **Pharmacist phone directory can now be found on [email or web address] ([email or web address]).  Listed under [place].  12/21/2018 8:25 AM

## 2018-12-21 NOTE — Progress Notes (Signed)
[facility] Progress Note  Subjective: CXR worse, on 5-8 L highflow [place] O2, moved to COVID floor despite COVID negative testing x 2 due to the nature of her resp illness/ hx immunosuppression , etc.    Vitals:   12/21/18 0700 12/21/18 0800 12/21/18 1130 12/21/18 1200  BP: (!) 123/53 (!) 142/62 137/67 135/71  Pulse:  (!) 109 99   Resp: (!) 22 (!) 24 (!) 23   Temp:  99.6 F (37.6 C) 99.2 F (37.3 C)   TempSrc:   Oral   SpO2: 98% 93% 100% 97%  Weight:        Inpatient medications: . acetaminophen  650 mg Oral Q6H  . amLODipine  10 mg Oral QHS  . aspirin EC  81 mg Oral q morning - 10a  . Chlorhexidine Gluconate Cloth  6 each Topical Q0600  . cycloSPORINE modified  200 mg Oral BID  . dextromethorphan-guaiFENesin  1 tablet Oral BID  . heparin  5,000 Units Subcutaneous Q8H  . mycophenolate  540 mg Oral BID  . predniSONE  5 mg Oral Q breakfast  . sodium chloride flush  3 mL Intravenous Q12H   . azithromycin 500 mg (12/21/18 0035)  . ceFEPime (MAXIPIME) IV 2 g (12/21/18 0313)  . [START ON 12/23/2018] vancomycin     HYDROmorphone (DILAUDID) injection, labetalol, ondansetron **OR** ondansetron (ZOFRAN) IV, polyethylene glycol    Exam:  Patient not examined directly given COVID-19 + status, utilizing exam of the primary team and staff.   Home meds:  - amlodipine 10 qd  - aspirin 81 qd  - cyclosporine 200 bid / mycophenolate 540 bid/ prednisone 5 qd  - prn's/ flagyl/ keflex  MWF at AF 4h   400/A1.5    63kg   2K/2.25Ca    AVF    Heparin 3000 bolus - Aranesp 116mcg IV weekly (last given 4/13)   Assessment/Plan: 1.  Fever/ hypoxemia/ pulm infiltrates/ acute hypoxemic resp failure: COVID tested negative x2 but concern of false negative due to severity of acute resp illness/ CXR changes , etc.  On empiric IV abx and HFNC 5-8 l/min.  2. Hyperkalemia: improved but not corrected w/ HD. Plan short HD today.  3. ESRD: on HD MWF. As above.  4. H/o kidney-panc transplant  - kidney failed, panc working on meds (CyA, cellcept and pred).  5.  Hypertension/volume: BP's up, 4kg over dry 6.  Anemia ckd: last darbe 160 ug 4/13 at OP center, due 4/20 Monday, will order   [facility] 12/21/2018, 12:21 PM  Iron/TIBC/Ferritin/ %Sat    Component Value Date/Time   IRON 34 (L) 11/15/2011 2004   TIBC 209 (L) 11/15/2011 2004   FERRITIN 302 12/21/2018 0737   IRONPCTSAT 16 (L) 11/15/2011 2004   Recent Labs  Lab 12/20/18 1318  12/21/18 0737 12/21/18 1029  NA 140   < > 135 137  K 6.6*   < > 6.5* 5.8*  CL 102   < > 93* 96*  CO2 18*   < > 25 25  GLUCOSE 86   < > 95 90  BUN 81*   < > 35* 39*  CREATININE 11.52*   < > 6.39* 6.82*  CALCIUM 8.9   < > 7.9* 7.8*  PHOS  --   --  6.2*  --   ALBUMIN 3.6   < > 3.1*  --   INR 1.2  --   --   --    < > = values  in this interval not displayed.   Recent Labs  Lab 12/20/18 2323  AST 14*  ALT 15  ALKPHOS 44  BILITOT 0.7  PROT 6.3*   Recent Labs  Lab 12/21/18 0539  WBC 12.6*  HGB 7.3*  HCT 24.1*  PLT 158

## 2018-12-21 NOTE — Progress Notes (Addendum)
Date: 12/21/2018  Patient name: National record number: [id number]  Date of birth: [birth year]    Subjective: She was taken for urgent hemodialysis for hyperkalemia, but was only able to complete a partial session due to pain in her chest, difficulty breathing, and worsening oxygenation.  She had 2.1 L of fluid removed.  She had worsening respiratory distress and hypoxia overnight, requiring escalation in her oxygen support up to HF [place] 8 L.  Repeat CXR showed worsening bilateral patchy airspace disease.  She continued to spike fevers.  Given escalation in her oxygen support and worsening clinical picture, the night team discussed her case with PCCM who declined to see the patient, but recommended proning, continuing her course of care, and reconsult if she worsens.  SARS-CoV-2 testing was sent again and again returned negative.  This morning, she reports feeling better with improvement in her breathing.  She still has some "aching" pain in her mid chest.  She is asking for another dose of pain medicine.  Not much appetite, but drinking okay.  Objective:  Vital signs in last 24 hours: Vitals:   12/21/18 0530 12/21/18 0600 12/21/18 0700 12/21/18 0800  BP:  136/64 (!) 123/53 (!) 142/62  Pulse:    (!) 109  Resp: (!) 22 (!) 24 (!) 22 (!) 24  Temp: 99.1 F (37.3 C)   99.6 F (37.6 C)  TempSrc: Oral     SpO2: 98% 97% 98% 93%  Weight:        Physical Exam: General: Lying in bed, appears comfortable, no distress HEENT: moist mucous membranes, no rhinorrhea, HF [place] in place Heart: Tachycardic, regular, no murmurs Lungs: Tachypneic in the 20s, normal respiratory effort, lung sounds diminished and difficult to auscultate in her loud room, unable to appreciate any adventitious lung sounds Abdomen: Soft, nontender, normal bowel sounds Extremities: Warm, no edema, normal cap refill  Significant new test results: ABG 7.47/42/67/29 Lactate 1.4, 1.7 Troponin negative Potassium 6.6,  4.5, 6.5 WBC 9.8, 11.4, 12.6 (16% lymphocytes) Hgb 8.2, 7.5, 7.3 Ferritin 300 ESR 51 CRP 12.1 Procalcitonin 0.1 D-dimer 2.1 UA large Hgb, 6-10 RBCs, 21-50 WBCs, nitrite negative Strep pneumo urine antigen negative RPP negative SARS-CoV-2 negative x2  CXR with bilateral diffuse opacities consistent with pulmonary vascular congestion and possible superimposed pneumonia or ARDS. CT abdomen shows bilateral diffuse groundglass opacities in the lung bases, small bilateral pleural effusions, trace pericardial effusion, and a trace amount of perinephric fluid around the transplanted kidney, but no other significant findings  Assessment/Plan:  Principal Problem:   Bilateral pneumonia Active Problems:   End stage renal disease (HCC)   CAP (community acquired pneumonia)   Pancreas transplant status ([place])   Immunosuppression ([place])  38 year old woman with a history of type 1 diabetes, kidney/pancreas transplant 2014, ESRD on HD after renal transplant failed, gastroparesis, on immunosuppression, admitted for 3 days of fever, cough, and shortness of breath.  She has had vomiting, diarrhea, myalgias and generalized weakness as well.  She missed HD on Wednesday due to her symptoms.  1.  Bilateral pneumonia: With influenza-like illness and negative flu test, high concern for COVID-19.  Imaging is also consistent with COVID-19.  Obviously, other etiologies of pneumonia are possible, especially in this immunocompromised young woman.  Need to consider bacterial, viral, and fungal etiologies.  In addition, she likely had some volume overload from missing HD, but her respiratory status worsened rather than improving after getting 2 L off and UF.  Her blood tests show elevated CRP  but negative procalcitonin as well as elevated d-dimer, all of which would be consistent with COVID-19.  Although COVID-19 presents more commonly with leukopenia, and the largest study to date from NEJM, 11% of patients with severe  COVID-19 presented with leukocytosis, and her relative lymphopenia on presentation (absolute lymphocyte count 1200) is also consistent.  Even with negative SARS-CoV-2 testing x2, my suspicion for COVID-19 remains high. - Empiric antibiotics broadened to cefepime and vancomycin - We will try to obtain sputum culture, but she reports her cough is dry - Legionella urinary antigen - ID consult for assistance with additional work-up and management - Continue oxygen support with goal O2 sat greater than 92% - Continue airborne and contact precautions while on aerosolyzing respiratory support  2.  ESRD with volume overload and hyperkalemia: Hyperkalemia initially improved after dialysis yesterday, but worsened again today, still elevated on repeat BMP this afternoon.  Does not look terribly volume overloaded to me on exam today, but weight is up 4 kg from dry weight, pulmonary function would likely benefit from being on the dry side. - Nephrology planning additional episode of HD today - Anemia slightly below baseline, will trend  3.  Chest pain: Likely related to coughing, but concern for ACS or PE.  We will start by checking troponin and repeat EKG.  May need CTA to evaluate for PE, especially if she does have COVID-19, as the risk for thrombosis is high.  4.  Kidney/pancreas transplant on immunosuppression:  Renal transplant has failed, but pancreas transplant is still functional. - Continue home prednisone 5 mg daily, cyclosporine 200 mg twice daily, mycophenolate 540 mg twice daily for now, but will consider holding if clinical status worsens  FEN: Renal diet Prophylaxis: Heparin Code Status: Full  Dispo: Requires inpatient care for bilateral pneumonia with hypoxia, unclear clinical course at this time  Lenice Pressman, M.D., Ph.D. 12/21/2018, 11:24 AM

## 2018-12-21 NOTE — Consult Note (Signed)
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 Atypical PNA -due to [name] [name]'s immunosuppressive regimen, multiple infectious etiologies are possible.  Given her history [name] highest concern would be for dimorphic fungus, specifically histoplasmosis or Aspergillus.  I agree with continuing empiric antibiotics with vancomycin, cefepime, and Flagyl for now.  We will also send serologies for Fungitell, Aspergillus galactomannan, cryptococcal serum antigen, and QuantiFERON gold.  In [name] event that [name] [name]'s respiratory distress does not improve following hemodialysis today, then she will likely need evaluation by [name] pulmonary service for consideration of a bronchoscopy in [name] next 24 to 48 hours.  I am pessimistic that [name] [name] will be able to provide adequate sputum for testing for a full wide range of infectious possibilities to include routine, fungal, AFB cultures, and silver staining for PJP.  She has already undergone 2 COVID-19 tests which were negative.  However, I agree with continuing droplet isolation and would consider obtaining a COVID-19 PCR in [name] event that a BAL is pursued.  2. Fever -uncertain if this is infectious in etiology given [name] [name]'s  hyperkalemia and noncompliance with hemodialysis prior to admission.  However, her infectious risks are quite extensive as noted above.  She has tested twice now via nasal PCR is negative for COVID-19.  Follow-up [name] [name]'s blood cultures to rule out bacteremia or sepsis.  Recommend repeating blood cultures x2 for any fever greater than 100.5.  Also recommend testing for additional causes of pneumonia as noted above.  3. Immunosuppression -[name] [name] states that her immunosuppressive regimen was decreased several months ago following failure of her renal transplant.  As her pancreas transplant reportedly remains intact she remains on cyclosporine, CellCept, and prednisone.  If fevers persist would consider holding [name] [name]'s CellCept/mycophenolate until fever subsides and infectious diagnosis is recognized.  She follows with a nephrologist in [facility] that manages her immunosuppressives.  It may be beneficial to reach out to their office on Monday should infectious concerns still remain.  4. ESRD -[name] [name] recently reinitiated hemodialysis in [name] fall 2019 following failure of her renal transplant.  She currently dialyzes on Monday Wednesday Fridays via a right arm AV fistula.  For [name] past 2 days consecutively she has had hemodialysis with a goal to remove approximately 2 L of fluid.  Will defer to nephrologist regarding further fluid removal and/or need for daily hemodialysis versus reestablishment of her outpatient schedule.  5. Leukocytosis -this is likely reactive from [name] [name]'s fluid overload.  This also does argue slightly against COVID-19 is several of these [name] specifically immunosuppressive patients will present often with leukopenia rather than leukocytosis.  Would check [name] CBC with differential daily until her white blood cell count elevation has resolved.  Assessment: [name] [name] is a 38 y/o [place] female with ESRD, s/p dual renal/pancreas transplant on  chronic immunosuppressives back on HD after her renal transplant failed and ongoing non-compliance with HD as an OP now admitted with fever, hyperkalemia, SOB, and possible atypical PNA.  Antibiotics: Vancomycin, day  Cefepime, day  Flagyl, day   HPI: [name] [name] is a 38 y.o. female with ESRD status post renal pancreas transplant on chronic immunosuppressives who is now back on hemodialysis following failure of her renal transplant admitted on December 20, 2018 for fever, hyperkalemia, and shortness of breath.  Per [name] [name], her transplant nephrologist Dr. Posey Pronto is at [facility] in [place].  She states that she often struggles with transportation for hemodialysis sessions since reinitiating dialysis in September of this past year.  I am unclear as to why this would be as [name] [name] does have Medicaid which would be able to transport her back and forth to dialysis.  Additionally, [name] [name] does have her own private vehicle however she does share this with her husband who works as a Media planner projects present.  This Wednesday, 2 days prior to admission, [name] [name] missed her hemodialysis session, claiming she had no transportation to dialysis that day.  1 day prior to admission she began developing shortness of breath muscular cramps and fever at home.  Rather than attending her normal dialysis session, her nephrologist advised her to present to [name] emergency department for evaluation.  [name] [name] states she has been self quarantining at home with [name] exception of her hemodialysis sessions due to [name] COVID-19 outbreak.  She and her husband did relocate approximately 1 month to 6 weeks ago to a new home.  Once in this new home she and her husband have been removing old paint and repainting many rooms.  [name] states she has not been using a mask nor has her husband during any of these tasks.  She denies any recent travel out of  [name] state or out of [name] country, stating she last was out of [name] country approximately 2 years ago.  She is natively from [place] but she has lived in [place] for [name] last 13 years.  Her husband is American and she has had no direct exposure to known foreign nationals in [name] past several months.  She does have a small dog at home who is not been ill and is up-to-date with all his vaccinations.  Otherwise she denies any other animal exposures specifically she denies any exposure to farm animals or birds.  On admission here, [name] [name] is undergone COVID-19 testing via nasal swab and was found to be negative.  Unfortunately, her chest imaging showed groundglass opacity consistent with possible fluid overload versus atypical pneumonia.  She was febrile to 103.1 overnight so empiric antibiotics with vancomycin, cefepime, and Flagyl were initiated.  Efforts to obtain sputum culture have been unsuccessful thus far as [name] [name] complains only of a dry nonproductive cough.  She denies any urinary symptoms, pelvic pain, headache, or recent falls or skin tears.  She did have emesis x1 [name] day of admission however.  She denies consuming any poorly cooked meat or any food that she felt was spoiled. Labs, medications, cx results, CoVID-19 results, and imagine independently reviewed  Review of Systems: [name] admits to emesis x1 [name] day of admission as well as  fever only on [name] day of admission as well.  She began developing shortness of breath 2 days ago following a missed dialysis session.  She also began developing muscular and abdominal cramps [name] day of admission as well.  She denies any headaches, blurry vision, diarrhea, or constipation. All other systems reviewed and are negative    Past Medical History:  Diagnosis Date   Anxiety    Gastroparesis    Narcotic abuse ([place])    Non compliance with medical treatment    Renal disorder    Vision loss     Social History   Tobacco Use   Smoking  status: Never Smoker   Smokeless tobacco: Never Used   Tobacco comment: Hooka  Substance Use Topics   Alcohol use: No   Drug use: No    Family History  Problem Relation Age of Onset   Hypertension Father     Allergies  Allergen Reactions   Fish-Derived Products Swelling   Shrimp [Shellfish Allergy] Rash    Physical Exam: Constitutional: 38 year old female in moderate respiratory distress with frequent retractions and tachypnea, alert and oriented x3 Vitals:   12/21/18 1445 12/21/18 1500  BP: 132/78 (!) 146/66  Pulse: 95 91  Resp:    Temp:    SpO2:    EYES: anicteric, EOMI, PE RRL ENMT: Moist mucous membranes, no evident oropharyngeal lesions, adequate dentition Cardiovascular: Normal rate regular rhythm, no cardiac murmurs evident Respiratory: Bibasilar crackles, slight expiratory wheeze, positive retractions on binasal cannula GI: Soft, nontender nondistended, hypoactive bowel sounds, renal graft palpable in [name] right iliac fossa Musculoskeletal: Normal muscle bulk and tone, small stature Skin: Darkened lesion along [name] [name]'s left shin with out tenderness or drainage, otherwise [name] [name] has no other skin lesions evident, no Janeway lesions or splinter hemorrhages.  Adequate skin turgor. Neurologic: Alert and oriented x3, no focal neurological deficits were appreciated, gait was not assessed Extremities: Right arm AV fistula with good audible bruit and palpable thrill, trace lower extremity edema, 2+ pulses throughout.  Lab Results  Component Value Date   WBC 12.6 (H) 12/21/2018   HGB 7.3 (L) 12/21/2018   HCT 24.1 (L) 12/21/2018   MCV 97.6 12/21/2018   PLT 158 12/21/2018    Lab Results  Component Value Date   CREATININE 6.82 (H) 12/21/2018   BUN 39 (H) 12/21/2018   NA 137 12/21/2018   K 5.8 (H) 12/21/2018   CL 96 (L) 12/21/2018   CO2 25 12/21/2018    Lab Results  Component Value Date   ALT 15 12/20/2018   AST 14 (L) 12/20/2018   ALKPHOS 44  12/20/2018     Microbiology: Recent Results (from [name] past 240 hour(s))  Culture, blood (Routine x 2)     Status: None (Preliminary result)   Collection Time: 12/20/18  1:18 PM  Result Value Ref Range Status   Specimen Description BLOOD LEFT ANTECUBITAL  Final   Special Requests   Final    BOTTLES DRAWN AEROBIC AND ANAEROBIC Blood Culture adequate volume   Culture   Final    NO GROWTH < 24 HOURS Performed at [facility], [address], [place], [place] [address]    Report Status PENDING  Incomplete  SARS Coronavirus 2 [facility] order, Performed in [facility] hospital lab)     Status: None   Collection Time: 12/20/18  2:01 PM  Result Value Ref Range Status   SARS Coronavirus 2 NEGATIVE NEGATIVE Final    Comment: (NOTE) If result  is NEGATIVE SARS-CoV-2 target nucleic acids are NOT DETECTED. [name] SARS-CoV-2 RNA is generally detectable in upper and lower  respiratory specimens during [name] acute phase of infection. [name] lowest  concentration of SARS-CoV-2 viral copies this assay can detect is 250  copies / mL. A negative result does not preclude SARS-CoV-2 infection  and should not be used as [name] sole basis for treatment or other  [name] management decisions.  A negative result may occur with  improper specimen collection / handling, submission of specimen other  than nasopharyngeal swab, presence of viral mutation(s) within [name]  areas targeted by this assay, and inadequate number of viral copies  (<250 copies / mL). A negative result must be combined with clinical  observations, [name] history, and epidemiological information. If result is POSITIVE SARS-CoV-2 target nucleic acids are DETECTED. [name] SARS-CoV-2 RNA is generally detectable in upper and lower  respiratory specimens dur ing [name] acute phase of infection.  Positive  results are indicative of active infection with SARS-CoV-2.  Clinical  correlation with [name] history and other diagnostic information is    necessary to determine [name] infection status.  Positive results do  not rule out bacterial infection or co-infection with other viruses. If result is PRESUMPTIVE POSTIVE SARS-CoV-2 nucleic acids MAY BE PRESENT.   A presumptive positive result was obtained on [name] submitted specimen  and confirmed on repeat testing.  While 2019 novel coronavirus  (SARS-CoV-2) nucleic acids may be present in [name] submitted sample  additional confirmatory testing may be necessary for epidemiological  and / or clinical management purposes  to differentiate between  SARS-CoV-2 and other Sarbecovirus currently known to infect humans.  If clinically indicated additional testing with an alternate test  methodology [phone]) is advised. [name] SARS-CoV-2 RNA is generally  detectable in upper and lower respiratory sp ecimens during [name] acute  phase of infection. [name] expected result is Negative. Fact Sheet for Patients:  StrictlyIdeas.no Fact Sheet for Healthcare Providers: BankingDealers.co.za This test is not yet approved or cleared by [name] FDA and has been authorized for detection and/or diagnosis of SARS-CoV-2 by FDA under an Emergency Use Authorization (EUA).  This EUA will remain in effect (meaning this test can be used) for [name] duration of [name] COVID-19 declaration under Section 564(b)(1) of [name] Act, 21 U.S.C. section 360bbb-3(b)(1), unless [name] authorization is terminated or revoked sooner. Performed at [facility], [place] [address], [place], [place] [address]   Respiratory Panel by PCR     Status: None   Collection Time: 12/20/18  2:01 PM  Result Value Ref Range Status   Adenovirus NOT DETECTED NOT DETECTED Final   Coronavirus 229E NOT DETECTED NOT DETECTED Final    Comment: (NOTE) [name] Coronavirus on [name] Respiratory Panel, DOES NOT test for [name] novel  Coronavirus (2019 nCoV)    Coronavirus HKU1 NOT DETECTED NOT DETECTED Final    Coronavirus NL63 NOT DETECTED NOT DETECTED Final   Coronavirus OC43 NOT DETECTED NOT DETECTED Final   Metapneumovirus NOT DETECTED NOT DETECTED Final   Rhinovirus / Enterovirus NOT DETECTED NOT DETECTED Final   Influenza A NOT DETECTED NOT DETECTED Final   Influenza B NOT DETECTED NOT DETECTED Final   Parainfluenza Virus 1 NOT DETECTED NOT DETECTED Final   Parainfluenza Virus 2 NOT DETECTED NOT DETECTED Final   Parainfluenza Virus 3 NOT DETECTED NOT DETECTED Final   Parainfluenza Virus 4 NOT DETECTED NOT DETECTED Final   Respiratory Syncytial Virus NOT DETECTED NOT DETECTED Final   Bordetella  pertussis NOT DETECTED NOT DETECTED Final   Chlamydophila pneumoniae NOT DETECTED NOT DETECTED Final   Mycoplasma pneumoniae NOT DETECTED NOT DETECTED Final    Comment: Performed at [facility], [place] [address], [place], [place] [address]  Culture, blood (Routine x 2)     Status: None (Preliminary result)   Collection Time: 12/20/18  3:30 PM  Result Value Ref Range Status   Specimen Description BLOOD LEFT HAND  Final   Special Requests   Final    BOTTLES DRAWN AEROBIC ONLY Blood Culture adequate volume   Culture   Final    NO GROWTH < 24 HOURS Performed at [facility], [place] [address] [place], [place] [address]    Report Status PENDING  Incomplete  SARS Coronavirus 2 [facility] order, Performed in [facility] hospital lab)     Status: None   Collection Time: 12/20/18 10:54 PM  Result Value Ref Range Status   SARS Coronavirus 2 NEGATIVE NEGATIVE Final    Comment: (NOTE) If result is NEGATIVE SARS-CoV-2 target nucleic acids are NOT DETECTED. [name] SARS-CoV-2 RNA is generally detectable in upper and lower  respiratory specimens during [name] acute phase of infection. [name] lowest  concentration of SARS-CoV-2 viral copies this assay can detect is 250  copies / mL. A negative result does not preclude SARS-CoV-2 infection  and should not be used as [name] sole basis for treatment or other    [name] management decisions.  A negative result may occur with  improper specimen collection / handling, submission of specimen other  than nasopharyngeal swab, presence of viral mutation(s) within [name]  areas targeted by this assay, and inadequate number of viral copies  (<250 copies / mL). A negative result must be combined with clinical  observations, [name] history, and epidemiological information. If result is POSITIVE SARS-CoV-2 target nucleic acids are DETECTED. [name] SARS-CoV-2 RNA is generally detectable in upper and lower  respiratory specimens dur ing [name] acute phase of infection.  Positive  results are indicative of active infection with SARS-CoV-2.  Clinical  correlation with [name] history and other diagnostic information is  necessary to determine [name] infection status.  Positive results do  not rule out bacterial infection or co-infection with other viruses. If result is PRESUMPTIVE POSTIVE SARS-CoV-2 nucleic acids MAY BE PRESENT.   A presumptive positive result was obtained on [name] submitted specimen  and confirmed on repeat testing.  While 2019 novel coronavirus  (SARS-CoV-2) nucleic acids may be present in [name] submitted sample  additional confirmatory testing may be necessary for epidemiological  and / or clinical management purposes  to differentiate between  SARS-CoV-2 and other Sarbecovirus currently known to infect humans.  If clinically indicated additional testing with an alternate test  methodology [phone]) is advised. [name] SARS-CoV-2 RNA is generally  detectable in upper and lower respiratory sp ecimens during [name] acute  phase of infection. [name] expected result is Negative. Fact Sheet for Patients:  StrictlyIdeas.no Fact Sheet for Healthcare Providers: BankingDealers.co.za This test is not yet approved or cleared by [name] FDA and has been authorized for detection and/or diagnosis of SARS-CoV-2  by FDA under an Emergency Use Authorization (EUA).  This EUA will remain in effect (meaning this test can be used) for [name] duration of [name] COVID-19 declaration under Section 564(b)(1) of [name] Act, 21 U.S.C. section 360bbb-3(b)(1), unless [name] authorization is terminated or revoked sooner. Performed at [facility], [place] [address] [place], [place] [address]  Janine Ores, MD [facility] for Infectious Disease [facility] [email or web address] 12/21/2018, 3:11 PM

## 2018-12-21 NOTE — Progress Notes (Signed)
CRITICAL VALUE ALERT  Critical Value:  Potassium 6.5  Date & Time Notied:  12/21/18 0830  Provider Notified: Karenann Cai MD at [phone]  Orders Received/Actions taken: Dr Rebeca Alert notified nephrology, no new orders

## 2018-12-22 DIAGNOSIS — J9601 Acute respiratory failure with hypoxia: Secondary | ICD-10-CM | POA: Diagnosis present

## 2018-12-22 DIAGNOSIS — E875 Hyperkalemia: Secondary | ICD-10-CM | POA: Diagnosis present

## 2018-12-22 DIAGNOSIS — D631 Anemia in chronic kidney disease: Secondary | ICD-10-CM

## 2018-12-22 DIAGNOSIS — J189 Pneumonia, unspecified organism: Principal | ICD-10-CM

## 2018-12-22 LAB — COMPREHENSIVE METABOLIC PANEL
ALT: 15 U/L (ref 0–44)
AST: 14 U/L — ABNORMAL LOW (ref 15–41)
Albumin: 3.1 g/dL — ABNORMAL LOW (ref 3.5–5.0)
Alkaline Phosphatase: 44 U/L (ref 38–126)
Anion gap: 14 (ref 5–15)
BUN: 26 mg/dL — ABNORMAL HIGH (ref 6–20)
CO2: 26 mmol/L (ref 22–32)
Calcium: 8.2 mg/dL — ABNORMAL LOW (ref 8.9–10.3)
Chloride: 96 mmol/L — ABNORMAL LOW (ref 98–111)
Creatinine, Ser: 5.65 mg/dL — ABNORMAL HIGH (ref 0.44–1.00)
GFR calc Af Amer: 10 mL/min — ABNORMAL LOW (ref >60)
GFR calc non Af Amer: 9 mL/min — ABNORMAL LOW (ref >60)
Glucose, Bld: 99 mg/dL (ref 70–99)
Potassium: 4.5 mmol/L (ref 3.5–5.1)
Sodium: 136 mmol/L (ref 135–145)
Total Bilirubin: 0.7 mg/dL (ref 0.3–1.2)
Total Protein: 6.3 g/dL — ABNORMAL LOW (ref 6.5–8.1)

## 2018-12-22 LAB — CRYPTOCOCCAL ANTIGEN: Crypto Ag: NEGATIVE

## 2018-12-22 LAB — BASIC METABOLIC PANEL
Anion gap: 13 (ref 5–15)
BUN: 27 mg/dL — ABNORMAL HIGH (ref 6–20)
CO2: 28 mmol/L (ref 22–32)
Calcium: 8 mg/dL — ABNORMAL LOW (ref 8.9–10.3)
Chloride: 94 mmol/L — ABNORMAL LOW (ref 98–111)
Creatinine, Ser: 5 mg/dL — ABNORMAL HIGH (ref 0.44–1.00)
GFR calc Af Amer: 12 mL/min — ABNORMAL LOW (ref >60)
GFR calc non Af Amer: 10 mL/min — ABNORMAL LOW (ref >60)
Glucose, Bld: 99 mg/dL (ref 70–99)
Potassium: 4.2 mmol/L (ref 3.5–5.1)
Sodium: 135 mmol/L (ref 135–145)

## 2018-12-22 LAB — GLUCOSE, CAPILLARY: Glucose-Capillary: 89 mg/dL (ref 70–99)

## 2018-12-22 MED ORDER — METOCLOPRAMIDE HCL 5 MG/ML IJ SOLN
10.0000 mg | Freq: Once | INTRAMUSCULAR | Status: AC | PRN
  Administered 2018-12-22: 10 mg via INTRAVENOUS
  Filled 2018-12-22: qty 2

## 2018-12-22 MED ORDER — SODIUM CHLORIDE 0.9 % IV SOLN
2.0000 g | INTRAVENOUS | Status: DC
  Administered 2018-12-22 – 2018-12-23 (×2): 2 g via INTRAVENOUS
  Filled 2018-12-22 (×3): qty 2

## 2018-12-22 MED ORDER — CHLORHEXIDINE GLUCONATE CLOTH 2 % EX PADS
6.0000 | MEDICATED_PAD | Freq: Every day | CUTANEOUS | Status: DC
  Administered 2018-12-26 – 2018-12-27 (×2): 6 via TOPICAL

## 2018-12-22 NOTE — Progress Notes (Signed)
[facility]   Reason for visit: Follow up on fever, atypical PNA  Antibiotic days: 3 Vancomycin, day 2 Cefepime, day 1 Azithromycin, day 3  Interval History: breathing less labored o/n following fluid removal with 2L off with HD yesterday; spiked fever to 101.4 last PM; nephrology plans to hold off HD today after 2 consecutive days dialyzing with goal tomorrow to dialyze down to dry weight fever curve, labs, and meds all independently reviewed; pt reports nausea (nursing reports this was after she received dilaudid for her costochondritis)  Physical Exam: Constitutional: 38 year old female in moderate respiratory distress with frequent retractions and tachypnea, alert and oriented x3 Vitals:   12/22/18 0900 12/22/18 1237  BP:  133/61  Pulse:  94  Resp:  (!) 22  Temp:  99.1 F (37.3 C)  SpO2: 96% 97%  EYES: anicteric, EOMI, PE RRL ENMT: Moist mucous membranes, no evident oropharyngeal lesions, adequate dentition Cardiovascular: Normal rate regular rhythm, no cardiac murmurs evident Respiratory: Bibasilar crackles, slight expiratory wheeze, positive retractions on binasal cannula GI: Soft, nontender nondistended, hypoactive bowel sounds, renal graft palpable in the right iliac fossa Musculoskeletal: Normal muscle bulk and tone, small stature Skin: Darkened lesion along the patient's left shin with out tenderness or drainage, otherwise the patient has no other skin lesions evident, no Janeway lesions or splinter hemorrhages.  Adequate skin turgor. Neurologic: Alert and oriented x3, no focal neurological deficits were appreciated, gait was not assessed Extremities: Right arm AV fistula with good audible bruit and palpable thrill, trace lower extremity edema, 2+ pulses throughout.  Review of Systems: Patient admits to emesis x1 the day of admission as well as fever only on the day of admission as well.  She began developing shortness of breath 2 days ago  following a missed dialysis session.  She also began developing muscular and abdominal cramps the day of admission as well.  She denies any headaches, blurry vision, diarrhea, or constipation. All other systems reviewed and are negative   Lab Results  Component Value Date   WBC 12.6 (H) 12/21/2018   HGB 7.3 (L) 12/21/2018   HCT 24.1 (L) 12/21/2018   MCV 97.6 12/21/2018   PLT 158 12/21/2018    Lab Results  Component Value Date   CREATININE 5.00 (H) 12/22/2018   BUN 27 (H) 12/22/2018   NA 135 12/22/2018   K 4.2 12/22/2018   CL 94 (L) 12/22/2018   CO2 28 12/22/2018    Lab Results  Component Value Date   ALT 15 12/20/2018   AST 14 (L) 12/20/2018   ALKPHOS 44 12/20/2018     Microbiology: Recent Results (from the past 240 hour(s))  Culture, blood (Routine x 2)     Status: None (Preliminary result)   Collection Time: 12/20/18  1:18 PM  Result Value Ref Range Status   Specimen Description BLOOD LEFT ANTECUBITAL  Final   Special Requests   Final    BOTTLES DRAWN AEROBIC AND ANAEROBIC Blood Culture adequate volume   Culture   Final    NO GROWTH 2 DAYS Performed at [facility], [address], [place], [place] [address]    Report Status PENDING  Incomplete  SARS Coronavirus 2 [facility] order, Performed in [place] hospital lab)     Status: None   Collection Time: 12/20/18  2:01 PM  Result Value Ref Range Status   SARS Coronavirus 2 NEGATIVE NEGATIVE Final    Comment: (NOTE) If result is NEGATIVE SARS-CoV-2 target  nucleic acids are NOT DETECTED. The SARS-CoV-2 RNA is generally detectable in upper and lower  respiratory specimens during the acute phase of infection. The lowest  concentration of SARS-CoV-2 viral copies this assay can detect is 250  copies / mL. A negative result does not preclude SARS-CoV-2 infection  and should not be used as the sole basis for treatment or other  patient management decisions.  A negative result may occur with  improper specimen  collection / handling, submission of specimen other  than nasopharyngeal swab, presence of viral mutation(s) within the  areas targeted by this assay, and inadequate number of viral copies  (<250 copies / mL). A negative result must be combined with clinical  observations, patient history, and epidemiological information. If result is POSITIVE SARS-CoV-2 target nucleic acids are DETECTED. The SARS-CoV-2 RNA is generally detectable in upper and lower  respiratory specimens dur ing the acute phase of infection.  Positive  results are indicative of active infection with SARS-CoV-2.  Clinical  correlation with patient history and other diagnostic information is  necessary to determine patient infection status.  Positive results do  not rule out bacterial infection or co-infection with other viruses. If result is PRESUMPTIVE POSTIVE SARS-CoV-2 nucleic acids MAY BE PRESENT.   A presumptive positive result was obtained on the submitted specimen  and confirmed on repeat testing.  While 2019 novel coronavirus  (SARS-CoV-2) nucleic acids may be present in the submitted sample  additional confirmatory testing may be necessary for epidemiological  and / or clinical management purposes  to differentiate between  SARS-CoV-2 and other Sarbecovirus currently known to infect humans.  If clinically indicated additional testing with an alternate test  methodology [phone]) is advised. The SARS-CoV-2 RNA is generally  detectable in upper and lower respiratory sp ecimens during the acute  phase of infection. The expected result is Negative. Fact Sheet for Patients:  StrictlyIdeas.no Fact Sheet for Healthcare Providers: BankingDealers.co.za This test is not yet approved or cleared by the [place] FDA and has been authorized for detection and/or diagnosis of SARS-CoV-2 by FDA under an Emergency Use Authorization (EUA).  This EUA will remain in effect  (meaning this test can be used) for the duration of the COVID-19 declaration under Section 564(b)(1) of the Act, 21 U.S.C. section 360bbb-3(b)(1), unless the authorization is terminated or revoked sooner. Performed at [facility], [place] [address], [place], [place] [address]   Respiratory Panel by PCR     Status: None   Collection Time: 12/20/18  2:01 PM  Result Value Ref Range Status   Adenovirus NOT DETECTED NOT DETECTED Final   Coronavirus 229E NOT DETECTED NOT DETECTED Final    Comment: (NOTE) The Coronavirus on the Respiratory Panel, DOES NOT test for the novel  Coronavirus (2019 nCoV)    Coronavirus HKU1 NOT DETECTED NOT DETECTED Final   Coronavirus NL63 NOT DETECTED NOT DETECTED Final   Coronavirus OC43 NOT DETECTED NOT DETECTED Final   Metapneumovirus NOT DETECTED NOT DETECTED Final   Rhinovirus / Enterovirus NOT DETECTED NOT DETECTED Final   Influenza A NOT DETECTED NOT DETECTED Final   Influenza B NOT DETECTED NOT DETECTED Final   Parainfluenza Virus 1 NOT DETECTED NOT DETECTED Final   Parainfluenza Virus 2 NOT DETECTED NOT DETECTED Final   Parainfluenza Virus 3 NOT DETECTED NOT DETECTED Final   Parainfluenza Virus 4 NOT DETECTED NOT DETECTED Final   Respiratory Syncytial Virus NOT DETECTED NOT DETECTED Final   Bordetella pertussis NOT DETECTED NOT DETECTED  Final   Chlamydophila pneumoniae NOT DETECTED NOT DETECTED Final   Mycoplasma pneumoniae NOT DETECTED NOT DETECTED Final    Comment: Performed at [facility], [place] [address], [place], [place] [address]  Culture, blood (Routine x 2)     Status: None (Preliminary result)   Collection Time: 12/20/18  3:30 PM  Result Value Ref Range Status   Specimen Description BLOOD LEFT HAND  Final   Special Requests   Final    BOTTLES DRAWN AEROBIC ONLY Blood Culture adequate volume   Culture   Final    NO GROWTH 2 DAYS Performed at [facility], [place] [address], [place], [place] [address]    Report Status  PENDING  Incomplete  SARS Coronavirus 2 [facility] order, Performed in [place] hospital lab)     Status: None   Collection Time: 12/20/18 10:54 PM  Result Value Ref Range Status   SARS Coronavirus 2 NEGATIVE NEGATIVE Final    Comment: (NOTE) If result is NEGATIVE SARS-CoV-2 target nucleic acids are NOT DETECTED. The SARS-CoV-2 RNA is generally detectable in upper and lower  respiratory specimens during the acute phase of infection. The lowest  concentration of SARS-CoV-2 viral copies this assay can detect is 250  copies / mL. A negative result does not preclude SARS-CoV-2 infection  and should not be used as the sole basis for treatment or other  patient management decisions.  A negative result may occur with  improper specimen collection / handling, submission of specimen other  than nasopharyngeal swab, presence of viral mutation(s) within the  areas targeted by this assay, and inadequate number of viral copies  (<250 copies / mL). A negative result must be combined with clinical  observations, patient history, and epidemiological information. If result is POSITIVE SARS-CoV-2 target nucleic acids are DETECTED. The SARS-CoV-2 RNA is generally detectable in upper and lower  respiratory specimens dur ing the acute phase of infection.  Positive  results are indicative of active infection with SARS-CoV-2.  Clinical  correlation with patient history and other diagnostic information is  necessary to determine patient infection status.  Positive results do  not rule out bacterial infection or co-infection with other viruses. If result is PRESUMPTIVE POSTIVE SARS-CoV-2 nucleic acids MAY BE PRESENT.   A presumptive positive result was obtained on the submitted specimen  and confirmed on repeat testing.  While 2019 novel coronavirus  (SARS-CoV-2) nucleic acids may be present in the submitted sample  additional confirmatory testing may be necessary for epidemiological  and / or clinical  management purposes  to differentiate between  SARS-CoV-2 and other Sarbecovirus currently known to infect humans.  If clinically indicated additional testing with an alternate test  methodology [phone]) is advised. The SARS-CoV-2 RNA is generally  detectable in upper and lower respiratory sp ecimens during the acute  phase of infection. The expected result is Negative. Fact Sheet for Patients:  StrictlyIdeas.no Fact Sheet for Healthcare Providers: BankingDealers.co.za This test is not yet approved or cleared by the [place] FDA and has been authorized for detection and/or diagnosis of SARS-CoV-2 by FDA under an Emergency Use Authorization (EUA).  This EUA will remain in effect (meaning this test can be used) for the duration of the COVID-19 declaration under Section 564(b)(1) of the Act, 21 U.S.C. section 360bbb-3(b)(1), unless the authorization is terminated or revoked sooner. 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 Atypical PNA -due to the patient's immunosuppressive regimen, multiple infectious etiologies are possible.  Given her history the highest concern would be for dimorphic fungus, specifically histoplasmosis or Aspergillus.  I agree with continuing empiric antibiotics with vancomycin, cefepime, and azithromycin for now.  F/u serologies for Fungitell, Aspergillus galactomannan, and QuantiFERON gold. Cryptococcal serum Ag was negative. In the event that the patient's respiratory distress does not improve following hemodialysis, then she will likely need evaluation by the pulmonary service for  consideration of a bronchoscopy in the next 24 to 48 hours.  I am pessimistic that the patient will be able to provide adequate sputum for testing for a full wide range of infectious possibilities to include routine, fungal, AFB cultures, and silver staining for PJP.  She has already undergone 2 COVID-19 tests which were negative.  However, I agree with continuing airborne isolation for now and would consider obtaining a COVID-19 PCR in the event that a BAL is pursued. Pt reports that her husband is only household contact, and he shows no signs of illness at present.  2. Fever -uncertain if this is infectious in etiology given the patient's hyperkalemia and noncompliance with hemodialysis prior to admission.  However, her infectious risks are quite extensive as noted above.  She has tested twice now via nasal PCR as negative for COVID-19.  Follow-up the patient's blood cultures to rule out bacteremia or sepsis.  Recommend repeating blood cultures x2 for any fever greater than 100.5.  Also recommend testing for additional causes of pneumonia as noted above.  3. Immunosuppression -the patient states that her immunosuppressive regimen was decreased several months ago following failure of her renal transplant.  As her pancreas transplant reportedly remains intact she remains on cyclosporine, CellCept, and prednisone.  If fevers persist would consider holding the patient's CellCept/mycophenolate until fever subsides and infectious diagnosis is recognized.  She follows with a nephrologist in [facility] (Dr. Posey Pronto) that manages her immunosuppressives.  It may be beneficial to reach out to his office on Monday should infectious concerns still remain.  4. ESRD -the patient recently reinitiated hemodialysis in the fall of 2019 following failure of her renal transplant.  She currently dialyzes on Monday Wednesday Fridays via a right arm AV fistula.  For the past 2 days consecutively she has had  hemodialysis with a goal to remove approximately 2 L of fluid.  Will defer to nephrologist regarding further fluid removal and/or need for daily hemodialysis versus reestablishment of her outpatient schedule. As cefepime is being used primarily to cover for GNR pathogen, would favor daily dosing rather than with HD alone. Have changed dosing to 2 gm IV daily for now.  5. Leukocytosis -this is likely reactive from the patient's fluid overload and/or hemoconcentration.  This also does argue slightly against COVID-19 as several of these patients, specifically immunosuppressive patients, will present often with leukopenia rather than leukocytosis.  Would check a CBC with differential daily until her white blood cell count elevation has resolved.

## 2018-12-22 NOTE — Progress Notes (Addendum)
Date: 12/22/2018  Patient name: [place] record number: [id number]  Date of birth: 11-20-1980    Subjective: Feeling a little bit better overall this morning.  However she reports that she feels that she is hypoglycemic.  I was in there while the nurse checked a CBG which was in the 80s.  Still complaining of cough and chest pain after coughing.  Objective:  Vital signs in last 24 hours: Vitals:   12/22/18 0525 12/22/18 0530 12/22/18 0715 12/22/18 0900  BP:  (!) 154/64 (!) 144/69   Pulse:   (!) 105   Resp: (!) 21 (!) 21 20   Temp:      TempSrc:      SpO2:   96% 96%  Weight:      Height:        Physical Exam: General: Lying in bed, appears comfortable, no distress HEENT: moist mucous membranes, no rhinorrhea,  Leigh in place on 4L Heart: RRR, no murmurs Lungs: Tachypneic in the 20s, normal respiratory effort, lung sounds diminished and difficult to auscultate in her loud room Abdomen: Soft, nontender, normal bowel sounds Extremities: Warm, no edema, normal cap refill  Significant test results: ABG 7.47/42/67/29 Lactate 1.4, 1.7 Troponin negative Potassium 6.6, 4.5, 6.5 WBC 9.8, 11.4, 12.6 (16% lymphocytes) Hgb 8.2, 7.5, 7.3 Ferritin 300 ESR 51 CRP 12.1 Procalcitonin 0.1 D-dimer 2.1 UA large Hgb, 6-10 RBCs, 21-50 WBCs, nitrite negative Strep pneumo urine antigen negative RPP negative SARS-CoV-2 negative x2 Cryptococcal ag negative  CXR with bilateral diffuse opacities consistent with pulmonary vascular congestion and possible superimposed pneumonia or ARDS. 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  She has had vomiting, diarrhea, myalgias and generalized weakness as well.  She missed HD on Wednesday due to her symptoms.  1.  Acute hypoxic respiratory failure 2/2 Bilateral pneumonia: slightly improved will less supplemental O2 support. With influenza-like illness and negative RVP. Even with negative SARS-CoV-2 testing x2, suspicion for COVID-19 remains high. - Empiric antibiotics cefepime, vancomycin, azithromycin  - Legionella urinary antigen pending - Quantiferon tb testing pending -Serum fungal testing - ID consulted - Continue oxygen support with goal O2 sat greater than 92% - Continue airborne and contact precautions while on aerosolyzing respiratory support  2.  ESRD with volume overload and hyperkalemia: - HD per Nephrology  - Anemia slightly below baseline, will trend  3.  Chest pain: Suspect 2/2 to coughing -EKG reassuring - Troponin mildly elevated at 0.07> likely secondary to stress response and ESRD, do not suspect active ACS.  4.  Kidney/pancreas transplant on immunosuppression:  Renal transplant has failed, but pancreas transplant is still functional. - Continue home prednisone 5 mg daily, cyclosporine 200 mg twice daily, mycophenolate 540 mg twice daily for now.  5. Anemia of Chronic kidney disease - Overall stable at 7.3. continue to trend daily.  6. Hyperkalemia -resolved with HD  FEN: Renal diet Prophylaxis: Heparin Code Status: Full  Dispo: Requires inpatient care for bilateral pneumonia with hypoxia, unclear clinical course at this time  Lucious Groves, DO  12/22/2018, 9:28 AM

## 2018-12-22 NOTE — Progress Notes (Signed)
[facility] Progress Note  Subjective: patient feeling a little better, nasal O2 down to 5L.  2L off w/ HD yesterday, down to 65kg.  No CXR today.   Vitals:   12/22/18 0525 12/22/18 0530 12/22/18 0715 12/22/18 0900  BP:  (!) 154/64 (!) 144/69   Pulse:   (!) 105   Resp: (!) 21 (!) 21 20   Temp:      TempSrc:      SpO2:   96% 96%  Weight:      Height:        Inpatient medications: . acetaminophen  650 mg Oral Q6H  . amLODipine  10 mg Oral QHS  . aspirin EC  81 mg Oral q morning - 10a  . cycloSPORINE modified  200 mg Oral BID  . dextromethorphan-guaiFENesin  1 tablet Oral BID  . heparin  5,000 Units Subcutaneous Q8H  . mycophenolate  540 mg Oral BID  . predniSONE  5 mg Oral Q breakfast  . sodium chloride flush  3 mL Intravenous Q12H   . azithromycin 500 mg (12/21/18 1803)  . ceFEPime (MAXIPIME) IV 2 g (12/21/18 0313)  . [START ON 12/23/2018] vancomycin     HYDROmorphone (DILAUDID) injection, labetalol, ondansetron **OR** ondansetron (ZOFRAN) IV, polyethylene glycol    Exam:  pt seen in room, no distress, nasal O2, not coughing, no ^wob  no jvd  Chest rales 1/3 up L > R base  Cor reg no mrg, tachy  Abd soft, ntnd  Ext no LE or UE edema  R arm aVF+bruit  NF, Ox3, pleasant  Home meds:  - amlodipine 10 qd  - aspirin 81 qd  - cyclosporine 200 bid / mycophenolate 540 bid/ prednisone 5 qd  - prn's/ flagyl/ keflex  MWF at AF 4h   400/A1.5    63kg   2K/2.25Ca    AVF    Heparin 3000 bolus - Aranesp 120mcg IV weekly (last given 4/13)   Assessment/Plan: 1.  Fever/ hypoxemia/ pulm infiltrates/ acute hypoxemic resp failure: COVID tested negative x2 but concern of false negative due to severity of acute resp illness/ CXR changes , etc.  On empiric IV abx and HFNC down to 5L today.  2L off w/ HD yest and no BP drops. Possible volume component as looking better today. Plan UF to dry wt tomorrow w/ HD.  2. Hyperkalemia: resolved 3. ESRD: on HD MWF. Had HD here  Friday and 3 h HD Sat. HD tomorrow, get down 2-3 kg to edw.  4. H/o kidney-panc transplant - kidney failed, panc working on meds (CyA, cellcept and pred).  5.  Hypertension/volume: BP's up 6.  Anemia ckd: last darbe 160 ug 4/13 at OP center, due 4/20 Monday, will order   [facility] 12/22/2018, 9:46 AM  Iron/TIBC/Ferritin/ %Sat    Component Value Date/Time   IRON 34 (L) 11/15/2011 2004   TIBC 209 (L) 11/15/2011 2004   FERRITIN 302 12/21/2018 0737   IRONPCTSAT 16 (L) 11/15/2011 2004   Recent Labs  Lab 12/20/18 1318  12/21/18 0737  12/22/18 0801  NA 140   < > 135   < > 135  K 6.6*   < > 6.5*   < > 4.2  CL 102   < > 93*   < > 94*  CO2 18*   < > 25   < > 28  GLUCOSE 86   < > 95   < > 99  BUN 81*   < >  35*   < > 27*  CREATININE 11.52*   < > 6.39*   < > 5.00*  CALCIUM 8.9   < > 7.9*   < > 8.0*  PHOS  --   --  6.2*  --   --   ALBUMIN 3.6   < > 3.1*  --   --   INR 1.2  --   --   --   --    < > = values in this interval not displayed.   Recent Labs  Lab 12/20/18 2323  AST 14*  ALT 15  ALKPHOS 44  BILITOT 0.7  PROT 6.3*   Recent Labs  Lab 12/21/18 0539  WBC 12.6*  HGB 7.3*  HCT 24.1*  PLT 158

## 2018-12-23 ENCOUNTER — Inpatient Hospital Stay (HOSPITAL_COMMUNITY): Payer: Medicare Other

## 2018-12-23 LAB — CBC WITH DIFFERENTIAL/PLATELET
Abs Immature Granulocytes: 0.02 10*3/uL (ref 0.00–0.07)
Basophils Absolute: 0 10*3/uL (ref 0.0–0.1)
Basophils Relative: 0 %
Eosinophils Absolute: 0.3 10*3/uL (ref 0.0–0.5)
Eosinophils Relative: 9 %
HCT: 20.6 % — ABNORMAL LOW (ref 36.0–46.0)
Hemoglobin: 6.3 g/dL — CL (ref 12.0–15.0)
Immature Granulocytes: 1 %
Lymphocytes Relative: 24 %
Lymphs Abs: 0.9 10*3/uL (ref 0.7–4.0)
MCH: 29.2 pg (ref 26.0–34.0)
MCHC: 30.6 g/dL (ref 30.0–36.0)
MCV: 95.4 fL (ref 80.0–100.0)
Monocytes Absolute: 0.3 10*3/uL (ref 0.1–1.0)
Monocytes Relative: 7 %
Neutro Abs: 2.2 10*3/uL (ref 1.7–7.7)
Neutrophils Relative %: 59 %
Platelets: 132 10*3/uL — ABNORMAL LOW (ref 150–400)
RBC: 2.16 MIL/uL — ABNORMAL LOW (ref 3.87–5.11)
RDW: 14.6 % (ref 11.5–15.5)
WBC: 3.7 10*3/uL — ABNORMAL LOW (ref 4.0–10.5)
nRBC: 0 % (ref 0.0–0.2)

## 2018-12-23 LAB — CBC
HCT: 27.4 % — ABNORMAL LOW (ref 36.0–46.0)
Hemoglobin: 9 g/dL — ABNORMAL LOW (ref 12.0–15.0)
MCH: 29.7 pg (ref 26.0–34.0)
MCHC: 32.8 g/dL (ref 30.0–36.0)
MCV: 90.4 fL (ref 80.0–100.0)
Platelets: 171 10*3/uL (ref 150–400)
RBC: 3.03 MIL/uL — ABNORMAL LOW (ref 3.87–5.11)
RDW: 14.6 % (ref 11.5–15.5)
WBC: 4.2 10*3/uL (ref 4.0–10.5)
nRBC: 0 % (ref 0.0–0.2)

## 2018-12-23 LAB — GLUCOSE, CAPILLARY
Glucose-Capillary: 119 mg/dL — ABNORMAL HIGH (ref 70–99)
Glucose-Capillary: 79 mg/dL (ref 70–99)
Glucose-Capillary: 72 mg/dL (ref 70–99)

## 2018-12-23 LAB — BASIC METABOLIC PANEL
Anion gap: 17 — ABNORMAL HIGH (ref 5–15)
BUN: 47 mg/dL — ABNORMAL HIGH (ref 6–20)
CO2: 23 mmol/L (ref 22–32)
Calcium: 8.1 mg/dL — ABNORMAL LOW (ref 8.9–10.3)
Chloride: 96 mmol/L — ABNORMAL LOW (ref 98–111)
Creatinine, Ser: 8.12 mg/dL — ABNORMAL HIGH (ref 0.44–1.00)
GFR calc Af Amer: 7 mL/min — ABNORMAL LOW (ref >60)
GFR calc non Af Amer: 6 mL/min — ABNORMAL LOW (ref >60)
Glucose, Bld: 79 mg/dL (ref 70–99)
Potassium: 4.2 mmol/L (ref 3.5–5.1)
Sodium: 136 mmol/L (ref 135–145)

## 2018-12-23 LAB — PREPARE RBC (CROSSMATCH)

## 2018-12-23 LAB — LEGIONELLA PNEUMOPHILA SEROGP 1 UR AG: L. pneumophila Serogp 1 Ur Ag: NEGATIVE

## 2018-12-23 IMAGING — DX PORTABLE CHEST - 1 VIEW
1 series · 1 of 1 positions shown · non-contrast
Comparison: Radiograph [DATE]

CLINICAL DATA: Cough/sob,pna,,covid

EXAM:
PORTABLE CHEST 1 VIEW

[chest ap]
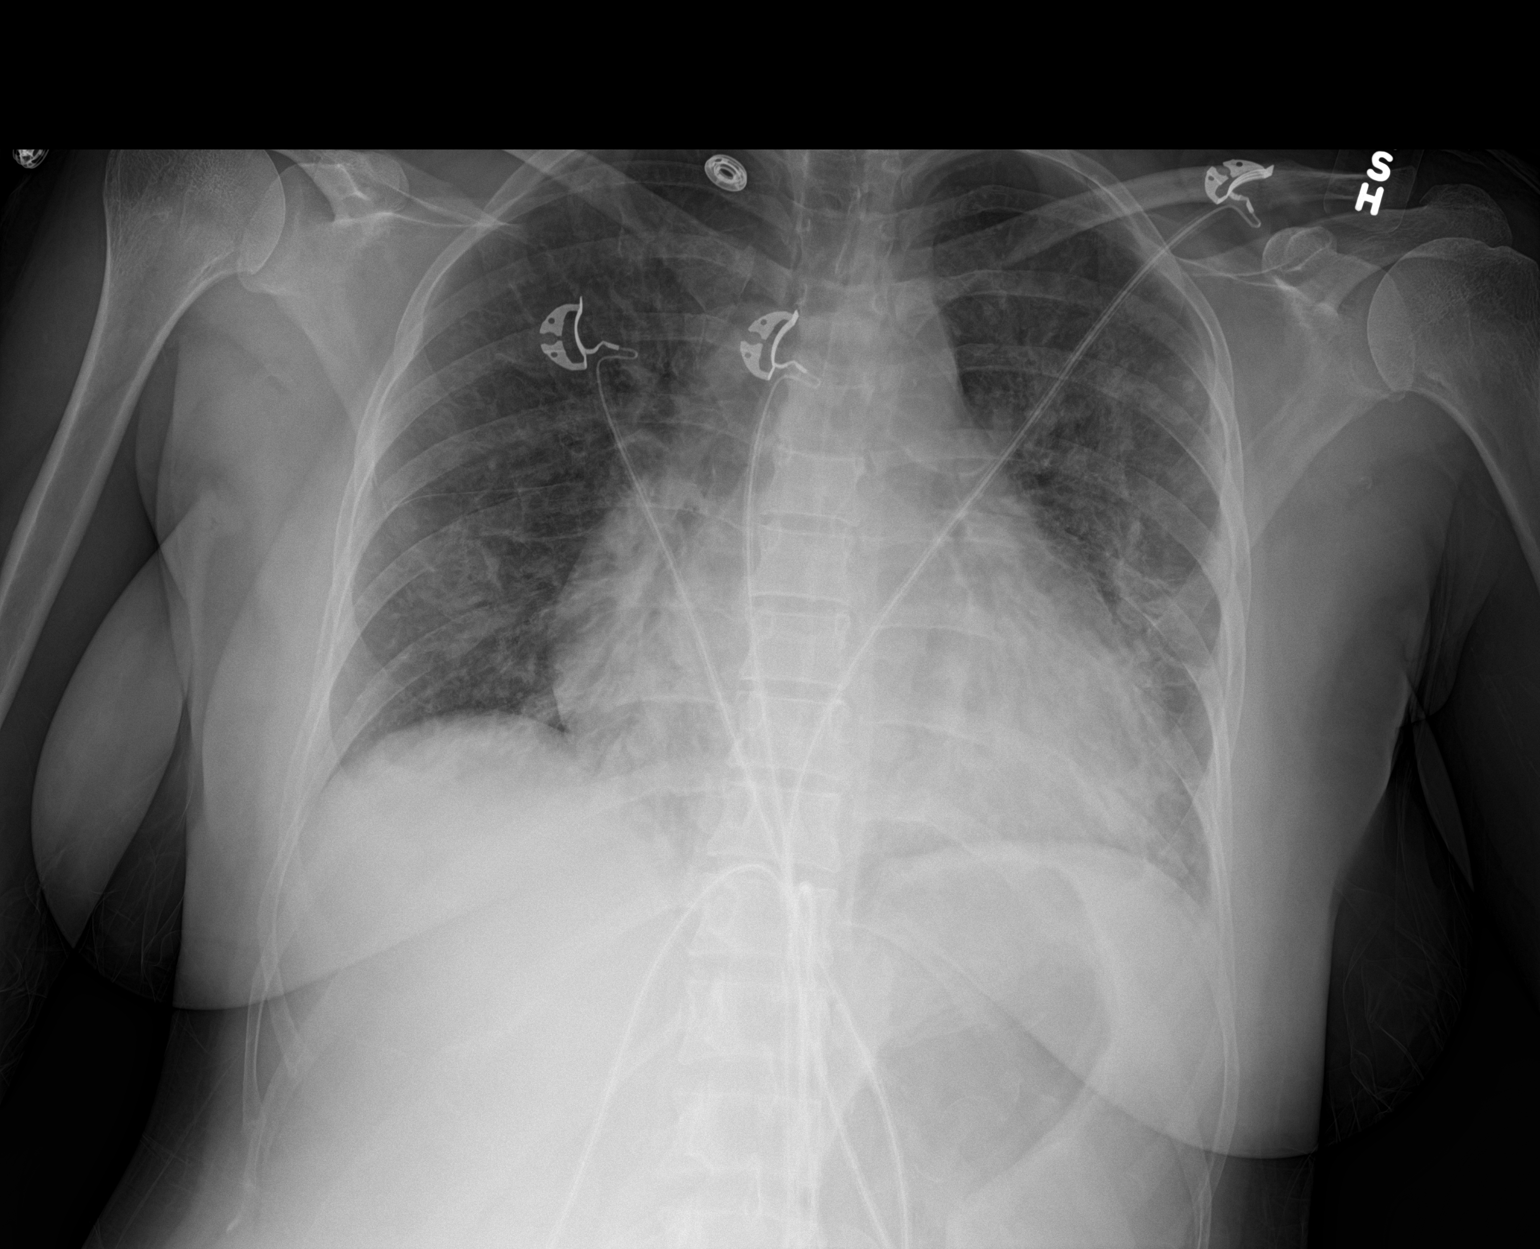

[1 of 1 positions shown; findings below may reference images not displayed]

FINDINGS: Cardiac silhouette slightly enlarged compared to prior. There is
improvement in bilateral central airspace disease. Mild LEFT lower
lobe airspace disease remains. No focal consolidation. No pleural
fluid. No pneumothorax.
IMPRESSION: 1. Considerable improvement in bilateral airspace disease most
suggestive of resolved pulmonary edema.
2. Mild LEFT lower lobe airspace disease remains which could
represent superimposed pneumonia.
3. Mild interval increase in cardiac silhouette consistent with
cardiomegaly versus pericardial effusion.

## 2018-12-23 MED ORDER — HEPARIN SODIUM (PORCINE) 1000 UNIT/ML IJ SOLN
INTRAMUSCULAR | Status: AC
  Administered 2018-12-23: 3000 [IU]
  Filled 2018-12-23: qty 3

## 2018-12-23 MED ORDER — SODIUM CHLORIDE 0.9% IV SOLUTION: Freq: Once | INTRAVENOUS | Status: DC

## 2018-12-23 MED ORDER — DARBEPOETIN ALFA 150 MCG/0.3ML IJ SOSY
PREFILLED_SYRINGE | INTRAMUSCULAR | Status: AC
  Administered 2018-12-23: 150 ug via INTRAVENOUS
  Filled 2018-12-23: qty 0.3

## 2018-12-23 MED ORDER — VANCOMYCIN HCL IN DEXTROSE 750-5 MG/150ML-% IV SOLN
INTRAVENOUS | Status: AC
  Administered 2018-12-23: 750 mg via INTRAVENOUS
  Filled 2018-12-23: qty 150

## 2018-12-23 MED ORDER — DIPHENHYDRAMINE HCL 25 MG PO CAPS
25.0000 mg | ORAL_CAPSULE | Freq: Four times a day (QID) | ORAL | Status: DC | PRN
  Administered 2018-12-23 – 2018-12-27 (×6): 25 mg via ORAL
  Filled 2018-12-23 (×6): qty 1

## 2018-12-23 MED ORDER — DARBEPOETIN ALFA 150 MCG/0.3ML IJ SOSY
150.0000 ug | PREFILLED_SYRINGE | INTRAMUSCULAR | Status: DC
  Administered 2018-12-23: 13:00:00 150 ug via INTRAVENOUS
  Filled 2018-12-23: qty 0.3

## 2018-12-23 NOTE — Clinical Social Work Note (Signed)
Readmission risk screening completed. No addition resources needed at this time. Pt's spouse will pick her up at d/c.   [place], [place]

## 2018-12-23 NOTE — Care Management Important Message (Signed)
Important Message  Patient Details  Name: [name] [name] MRN: [id number] Date of Birth: 24-Nov-1980   Medicare Important Message Given:  Yes    Gerrianne Scale Gawain Crombie, LCSW 12/23/2018, 2:57 PM

## 2018-12-23 NOTE — Progress Notes (Signed)
Date: 12/23/2018  Patient name: [place] record number: [id number]  Date of birth: 1980/09/23    Subjective: Continues to feel better, some right arm pain from previous IV site, improved after moving IV. C/o itching this morning.  Objective:  Vital signs in last 24 hours: Vitals:   12/23/18 0300 12/23/18 0327 12/23/18 0412 12/23/18 0751  BP:   (!) 158/73 (!) 153/66  Pulse:   94 93  Resp: 14   16  Temp:  98.4 F (36.9 C)    TempSrc:  Tympanic    SpO2: 100%  96% 99%  Weight:      Height:        Physical Exam: General: Lying in bed, appears comfortable, no distress HEENT: moist mucous membranes, no rhinorrhea,  [place] in place on 3L Heart: RRR, no murmurs Lungs: normal resp rate, normal respiratory effort, lungs with miminal basilar crackles bilaterally Abdomen: Soft, nontender, normal bowel sounds Extremities: Warm, no edema, normal cap refill  Significant test results: ABG 7.47/42/67/29 Lactate 1.4, 1.7 Troponin negative Potassium 6.6, 4.5, 6.5 WBC 9.8, 11.4, 12.6 (16% lymphocytes) Hgb 8.2, 7.5, 7.3 Ferritin 300 ESR 51 CRP 12.1 Procalcitonin 0.1 D-dimer 2.1 UA large Hgb, 6-10 RBCs, 21-50 WBCs, nitrite negative Strep pneumo urine antigen negative RPP negative SARS-CoV-2 negative x2 Cryptococcal ag negative  CXR with bilateral diffuse opacities consistent with pulmonary vascular congestion and possible superimposed pneumonia or ARDS. CT abdomen shows bilateral diffuse groundglass opacities in the lung bases, small bilateral pleural effusions, trace pericardial effusion, and a trace amount of perinephric fluid around the transplanted kidney, but no other significant findings  CXR 4/20, reivewed, greatly improved bilateral aeration, mild increased cardiac silhouette however is portable AP. Assessment/Plan:  Principal Problem:   Bilateral pneumonia Active Problems:   End stage renal disease ([place])   CAP (community acquired pneumonia)   Pancreas transplant  status ([place])   Immunosuppression ([place])   Chest pain   Acute respiratory failure with hypoxia ([place])   Hyperkalemia  38 year old woman with a history of type 1 diabetes, kidney/pancreas transplant 2014, ESRD on HD after renal transplant failed, gastroparesis, on immunosuppression, admitted for 3 days of fever, cough, and shortness of breath.  She has had vomiting, diarrhea, myalgias and generalized weakness as well.  She missed HD on Wednesday due to her symptoms.  1.  Acute hypoxic respiratory failure 2/2 Bilateral pneumonia: slightly improved will less supplemental O2 support. With influenza-like illness and negative RVP. Even with negative SARS-CoV-2 testing x2, suspicion for COVID-19 remains elevated - Empiric antibiotics cefepime, vancomycin, azithromycin  - Legionella urinary antigen pending - Quantiferon tb testing pending - Serum fungal testing - ID consulted - Continue oxygen support with goal O2 sat greater than 92%>> discussed with nurse to wean O2 as tolerated. - Continue airborne and contact precautions while on aerosolyzing respiratory support -Ideally bronchoscopy and BAL may be obtained, however respiratory status is improving and fevers have resolved but receiving acetaminophen regularly. Will repeat CBC w/ diff today.  2.  ESRD with volume overload and hyperkalemia: - HD per Nephrology- CXR has improved greatly with vol removal. Plan for 2-3L off today. - Anemia slightly below baseline, will trend  3.  Chest pain: Suspect 2/2 to coughing -EKG reassuring - Troponin mildly elevated at 0.07> likely secondary to stress response and ESRD, do not suspect active ACS.  4.  Kidney/pancreas transplant on immunosuppression:  Renal transplant has failed, but pancreas transplant is still functional. - Continue home prednisone 5 mg daily, cyclosporine 200  mg twice daily, mycophenolate 540 mg twice daily for now.  5. Anemia of Chronic kidney disease - Overall stable at 7.3. continue  to trend daily.  6. Hyperkalemia -resolved with HD  FEN: Renal diet Prophylaxis: Heparin Code Status: Full  Dispo: Requires inpatient care for bilateral pneumonia with hypoxia, possible discharge in 2-3 days.  Lucious Groves, DO  12/23/2018, 11:02 AM

## 2018-12-23 NOTE — Progress Notes (Signed)
CRITICAL VALUE ALERT  Critical Value:  Hemoglobin (6.3)  Date & Time Notied:  12/23/2018 @ 1329  Provider Notified: Dr Maricela Bo @ 1443  Orders Received/Actions taken: Orders were given to receive blood during and post-transfusion lab

## 2018-12-23 NOTE — Progress Notes (Signed)
Patient ID: Mckinzi Eriksen, female   DOB: 1981-07-03, 38 y.o.   MRN: [id number]  [facility] Progress Note    Subjective:   Feels better but complaining of diffuse "itching" and would like some benadryl   Objective:   BP (!) 153/66 (BP Location: Left Arm)   Pulse 93   Temp 98.4 F (36.9 C) (Tympanic)   Resp 16   Ht 5\' 5"  (1.651 m)   Wt 65 kg   LMP 12/20/2018   SpO2 99%   BMI 23.85 kg/m   Intake/Output: I/O last 3 completed shifts: In: 1560 [P.O.:1560] Out: -    Intake/Output this shift:  No intake/output data recorded. Weight change:   Physical Exam: Gen: NAD Ext: no edema, RUE AVF +T/B with some ecchymosis.  Labs: BMET Recent Labs  Lab 12/20/18 1318 12/20/18 2323 12/21/18 0737 12/21/18 1029 12/22/18 0801  NA 140 136 135 137 135  K 6.6* 4.5 6.5* 5.8* 4.2  CL 102 96* 93* 96* 94*  CO2 18* 26 25 25 28   GLUCOSE 86 99 95 90 99  BUN 81* 26* 35* 39* 27*  CREATININE 11.52* 5.65* 6.39* 6.82* 5.00*  ALBUMIN 3.6 3.1* 3.1*  --   --   CALCIUM 8.9 8.2* 7.9* 7.8* 8.0*  PHOS  --   --  6.2*  --   --    CBC Recent Labs  Lab 12/20/18 1318 12/20/18 2323 12/21/18 0539  WBC 9.8 11.4* 12.6*  NEUTROABS 7.4 9.4* 9.6*  HGB 8.2* 7.5* 7.3*  HCT 26.5* 23.3* 24.1*  MCV 99.3 94.3 97.6  PLT 171 167 158    @IMGRELPRIORS @ Medications:    . acetaminophen  650 mg Oral Q6H  . amLODipine  10 mg Oral QHS  . aspirin EC  81 mg Oral q morning - 10a  . Chlorhexidine Gluconate Cloth  6 each Topical Q0600  . cycloSPORINE modified  200 mg Oral BID  . dextromethorphan-guaiFENesin  1 tablet Oral BID  . heparin  5,000 Units Subcutaneous Q8H  . mycophenolate  540 mg Oral BID  . predniSONE  5 mg Oral Q breakfast  . sodium chloride flush  3 mL Intravenous Q12H   MWF at AF 4h   400/A1.5    63kg   2K/2.25Ca    AVF    Heparin 3000 bolus - Aranesp 141mcg IV weekly (last given 4/13)  Assessment/ Plan:   1. Fever with hypoxemia and pulmonary infiltrates with respiratory  distress.  Negative covid x 2.  ID following and felt this to likely be atypical pneumonia, concerning for fungal ie. Histoplasmosis or Aspergillus.  May require pulmonary evaluation but improved after serial HD and UF. 2. ESRD will get back on schedule today and cont to challenge edw. 3. 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 Nutrition:renal diet 6. Hypertension: stable 7. 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 8. Hyperkalemia- resolved.   Donetta Potts, MD [place] Pager [phone] 12/23/2018, 11:00 AM

## 2018-12-24 DIAGNOSIS — Z9889 Other specified postprocedural states: Secondary | ICD-10-CM

## 2018-12-24 LAB — TYPE AND SCREEN
ABO/RH(D): O POS
Antibody Screen: NEGATIVE
Unit division: 0

## 2018-12-24 LAB — BPAM RBC
Blood Product Expiration Date: 202005012359
ISSUE DATE / TIME: 202004201550
Unit Type and Rh: 5100

## 2018-12-24 LAB — BASIC METABOLIC PANEL
Anion gap: 17 — ABNORMAL HIGH (ref 5–15)
BUN: 23 mg/dL — ABNORMAL HIGH (ref 6–20)
CO2: 25 mmol/L (ref 22–32)
Calcium: 8.6 mg/dL — ABNORMAL LOW (ref 8.9–10.3)
Chloride: 95 mmol/L — ABNORMAL LOW (ref 98–111)
Creatinine, Ser: 5.15 mg/dL — ABNORMAL HIGH (ref 0.44–1.00)
GFR calc Af Amer: 12 mL/min — ABNORMAL LOW (ref >60)
GFR calc non Af Amer: 10 mL/min — ABNORMAL LOW (ref >60)
Glucose, Bld: 92 mg/dL (ref 70–99)
Potassium: 3.6 mmol/L (ref 3.5–5.1)
Sodium: 137 mmol/L (ref 135–145)

## 2018-12-24 LAB — QUANTIFERON-TB GOLD PLUS: QuantiFERON-TB Gold Plus: NEGATIVE

## 2018-12-24 LAB — QUANTIFERON-TB GOLD PLUS (RQFGPL)
QuantiFERON Mitogen Value: >10 IU/mL
QuantiFERON Nil Value: 0.04 IU/mL
QuantiFERON TB1 Ag Value: 0.04 IU/mL
QuantiFERON TB2 Ag Value: 0.04 IU/mL

## 2018-12-24 LAB — FUNGITELL, SERUM: Fungitell Result: 41 pg/mL (ref <80)

## 2018-12-24 MED ORDER — HYDROMORPHONE HCL 1 MG/ML IJ SOLN
0.5000 mg | Freq: Four times a day (QID) | INTRAMUSCULAR | Status: DC | PRN
  Administered 2018-12-24: 1 mg via INTRAVENOUS
  Administered 2018-12-24: 0.5 mg via INTRAVENOUS
  Administered 2018-12-25 – 2018-12-26 (×6): 1 mg via INTRAVENOUS
  Administered 2018-12-26: 0.5 mg via INTRAVENOUS
  Administered 2018-12-27 (×3): 1 mg via INTRAVENOUS
  Filled 2018-12-24 (×12): qty 1

## 2018-12-24 MED ORDER — SODIUM CHLORIDE 0.9 % IV SOLN
1.0000 g | INTRAVENOUS | Status: DC
  Administered 2018-12-24 – 2018-12-27 (×4): 1 g via INTRAVENOUS
  Filled 2018-12-24 (×4): qty 1

## 2018-12-24 NOTE — Plan of Care (Signed)
  Problem: Education: Goal: Knowledge of General Education information will improve Description Including pain rating scale, medication(s)/side effects and non-pharmacologic comfort measures Outcome: Progressing   Problem: Health Behavior/Discharge Planning: Goal: Ability to manage health-related needs will improve Outcome: Progressing   Problem: Coping: Goal: Level of anxiety will decrease Outcome: Progressing   Problem: Elimination: Goal: Will not experience complications related to bowel motility Outcome: Progressing   Problem: Elimination: Goal: Will not experience complications related to urinary retention Outcome: Progressing   Problem: Pain Managment: Goal: General experience of comfort will improve Outcome: Progressing

## 2018-12-24 NOTE — Progress Notes (Signed)
Pharmacy Antibiotic Note  [name] [name] is a 38 y.o. female admitted on 12/20/2018 with pneumonia.  Pharmacy has been consulted to dose cefepime and vancomycin. ESRD on HD MWF  Patient's last dialysis was 4/20 inpatient.  Pt continues to be febrile, WBC 4.2,   Plan: Cefepime 1gm IV q24h per ID Vancomycin 750mg  IV after each HD.  Height: 5\' 5"  (165.1 cm) Weight: 140 lb 10.5 oz (63.8 kg) IBW/kg (Calculated) : 57  Temp (24hrs), Avg:98.1 F (36.7 C), Min:97.8 F (36.6 C), Max:98.4 F (36.9 C)  Recent Labs  Lab 12/20/18 1318 12/20/18 2323 12/21/18 0539 12/21/18 0737 12/21/18 1029 12/22/18 0801 12/23/18 1307 12/23/18 1327 12/23/18 2036 12/24/18 0745  WBC 9.8 11.4* 12.6*  --   --   --  3.7*  --  4.2  --   CREATININE 11.52* 5.65*  --  6.39* 6.82* 5.00*  --  8.12*  --  5.15*  LATICACIDVEN 1.4 1.7  --   --   --   --   --   --   --   --     Estimated Creatinine Clearance: 13.6 mL/min (A) (by C-G formula based on SCr of 5.15 mg/dL (H)).    Allergies  Allergen Reactions  . Fish-Derived Products Swelling  . Shrimp [Shellfish Allergy] Rash    Eular Panek A. Levada Dy, PharmD, [place] Pager: [phone] Please utilize Amion for appropriate phone number to reach the unit pharmacist ([place])    12/24/2018 10:31 AM

## 2018-12-24 NOTE — Progress Notes (Signed)
Patient ID: [name] [name], female   DOB: 03/23/1981, 38 y.o.   MRN: [id number] S: No events overnight.  Afebrile O:BP (!) 179/73 (BP Location: Left Arm)   Pulse 95   Temp 98.4 F (36.9 C) (Oral)   Resp 18   Ht 5\' 5"  (1.651 m)   Wt 63.8 kg   LMP 12/20/2018   SpO2 (!) 65%   BMI 23.41 kg/m   Intake/Output Summary (Last 24 hours) at 12/24/2018 1659 Last data filed at 12/24/2018 0508 Gross per 24 hour  Intake 1490.72 ml  Output -  Net 1490.72 ml   Intake/Output: I/O last 3 completed shifts: In: 2165.7 [P.O.:978; Blood:315; IV Piggyback:872.7] Out: 2035 [Other:2035]  Intake/Output this shift:  No intake/output data recorded. Weight change:  Physical exam was not performed due to concern for covid-19 and exam used by primary svc as reference.  Recent Labs  Lab 12/20/18 1318 12/20/18 2323 12/21/18 0737 12/21/18 1029 12/22/18 0801 12/23/18 1327 12/24/18 0745  NA 140 136 135 137 135 136 137  K 6.6* 4.5 6.5* 5.8* 4.2 4.2 3.6  CL 102 96* 93* 96* 94* 96* 95*  CO2 18* 26 25 25 28 23 25   GLUCOSE 86 99 95 90 99 79 92  BUN 81* 26* 35* 39* 27* 47* 23*  CREATININE 11.52* 5.65* 6.39* 6.82* 5.00* 8.12* 5.15*  ALBUMIN 3.6 3.1* 3.1*  --   --   --   --   CALCIUM 8.9 8.2* 7.9* 7.8* 8.0* 8.1* 8.6*  PHOS  --   --  6.2*  --   --   --   --   AST 11* 14*  --   --   --   --   --   ALT 15 15  --   --   --   --   --    Liver Function Tests: Recent Labs  Lab 12/20/18 1318 12/20/18 2323 12/21/18 0737  AST 11* 14*  --   ALT 15 15  --   ALKPHOS 57 44  --   BILITOT 0.6 0.7  --   PROT 6.9 6.3*  --   ALBUMIN 3.6 3.1* 3.1*   No results for input(s): LIPASE, AMYLASE in the last 168 hours. No results for input(s): AMMONIA in the last 168 hours. CBC: Recent Labs  Lab 12/20/18 1318 12/20/18 2323 12/21/18 0539 12/23/18 1307 12/23/18 2036  WBC 9.8 11.4* 12.6* 3.7* 4.2  NEUTROABS 7.4 9.4* 9.6* 2.2  --   HGB 8.2* 7.5* 7.3* 6.3* 9.0*  HCT 26.5* 23.3* 24.1* 20.6* 27.4*  MCV 99.3 94.3 97.6 95.4  90.4  PLT 171 167 158 132* 171   Cardiac Enzymes: Recent Labs  Lab 12/20/18 1401 12/21/18 1029  CKTOTAL  --  120  TROPONINI <0.03 0.07*   CBG: Recent Labs  Lab 12/20/18 1837 12/20/18 1847 12/21/18 2100 12/22/18 0916 12/23/18 1735  GLUCAP 79 119* 88 89 72    Iron Studies: No results for input(s): IRON, TIBC, TRANSFERRIN, FERRITIN in the last 72 hours. Studies/Results: Dg Chest Port 1 View  Result Date: 12/23/2018 CLINICAL DATA:  Cough/sob,pna,,covid EXAM: PORTABLE CHEST 1 VIEW COMPARISON:  Radiograph 12/20/2018 FINDINGS: Cardiac silhouette slightly enlarged compared to prior. There is improvement in bilateral central airspace disease. Mild LEFT lower lobe airspace disease remains. No focal consolidation. No pleural fluid. No pneumothorax. IMPRESSION: 1. Considerable improvement in bilateral airspace disease most suggestive of resolved pulmonary edema. 2. Mild LEFT lower lobe airspace disease remains which could represent superimposed pneumonia. 3. Mild  interval increase in cardiac silhouette consistent with cardiomegaly versus pericardial effusion. Electronically Signed   By: Suzy Bouchard M.D.   On: 12/23/2018 08:12   . sodium chloride   Intravenous Once  . amLODipine  10 mg Oral QHS  . aspirin EC  81 mg Oral q morning - 10a  . Chlorhexidine Gluconate Cloth  6 each Topical Q0600  . cycloSPORINE modified  200 mg Oral BID  . darbepoetin (ARANESP) injection - DIALYSIS  150 mcg Intravenous Q Mon-HD  . dextromethorphan-guaiFENesin  1 tablet Oral BID  . heparin  5,000 Units Subcutaneous Q8H  . mycophenolate  540 mg Oral BID  . predniSONE  5 mg Oral Q breakfast  . sodium chloride flush  3 mL Intravenous Q12H    BMET    Component Value Date/Time   NA 137 12/24/2018 0745   K 3.6 12/24/2018 0745   CL 95 (L) 12/24/2018 0745   CO2 25 12/24/2018 0745   GLUCOSE 92 12/24/2018 0745   BUN 23 (H) 12/24/2018 0745   CREATININE 5.15 (H) 12/24/2018 0745   CALCIUM 8.6 (L) 12/24/2018  0745   GFRNONAA 10 (L) 12/24/2018 0745   GFRAA 12 (L) 12/24/2018 0745   CBC    Component Value Date/Time   WBC 4.2 12/23/2018 2036   RBC 3.03 (L) 12/23/2018 2036   HGB 9.0 (L) 12/23/2018 2036   HCT 27.4 (L) 12/23/2018 2036   PLT 171 12/23/2018 2036   MCV 90.4 12/23/2018 2036   MCH 29.7 12/23/2018 2036   MCHC 32.8 12/23/2018 2036   RDW 14.6 12/23/2018 2036   LYMPHSABS 0.9 12/23/2018 1307   MONOABS 0.3 12/23/2018 1307   EOSABS 0.3 12/23/2018 1307   BASOSABS 0.0 12/23/2018 1307    MWF at AF 4h 400/A1.5 63kg 2K/2.25Ca AVF Heparin 3000 bolus - Aranesp 146mcg IV weekly (last given 4/13)  Assessment/ Plan:   1. Fever with hypoxemia and pulmonary infiltrates with respiratory distress.  Negative covid x 2.  ID following and felt this to likely be atypical pneumonia, concerning for fungal ie. Histoplasmosis or Aspergillus.  May require pulmonary evaluation but improved after serial HD and UF. 2. ESRD will continue with HD on MWF schedule and cont to challenge edw. 3. 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 Nutrition:renal diet 6. Hypertension: stable 7. 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 8. Hyperkalemia- resolved with HD.    Donetta Potts, MD [employer] [phone]

## 2018-12-24 NOTE — Progress Notes (Signed)
Date: 12/24/2018  Patient name: [name] [name] record number: [id number]  Date of birth: 04-Feb-1981    Subjective: Only complaint was receiving her food a little late this morning.  Otherwise appreciative of her care notes that her shortness of breath is continuing to improve.  Objective:  Vital signs in last 24 hours: Vitals:   12/23/18 1700 12/23/18 1953 12/24/18 0037 12/24/18 1000  BP: (!) 168/72 (!) 166/66 (!) 167/75 (!) 179/73  Pulse: 96 85  95  Resp:   18   Temp:  98.3 F (36.8 C) 98 F (36.7 C) 98.4 F (36.9 C)  TempSrc:  Oral Oral Oral  SpO2: 94% 97% 95% (!) 65%  Weight:      Height:        Physical Exam: General: Lying in bed, appears comfortable, no distress HEENT: moist mucous membranes, no rhinorrhea,  [place] in place on 1L Heart: RRR, no murmurs Lungs: normal resp rate, normal respiratory effort, lungs grossly clear on my exam Abdomen: Soft, nontender, normal bowel sounds Extremities: Warm, no edema, normal cap refill  Significant test results: ABG 7.47/42/67/29 Lactate 1.4, 1.7 Troponin negative Potassium 6.6, 4.5, 6.5 WBC 9.8, 11.4, 12.6 (16% lymphocytes) Hgb 8.2, 7.5, 7.3 Ferritin 300 ESR 51 CRP 12.1 Procalcitonin 0.1 D-dimer 2.1 UA large Hgb, 6-10 RBCs, 21-50 WBCs, nitrite negative Strep pneumo urine antigen negative Urine Legonella ag neg RPP negative SARS-CoV-2 negative x2 Cryptococcal ag negative  CXR with bilateral diffuse opacities consistent with pulmonary vascular congestion and possible superimposed pneumonia or ARDS. CT abdomen shows bilateral diffuse groundglass opacities in the lung bases, small bilateral pleural effusions, trace pericardial effusion, and a trace amount of perinephric fluid around the transplanted kidney, but no other significant findings  CXR 4/20, reivewed, greatly improved bilateral aeration, mild increased cardiac silhouette however is portable AP. Assessment/Plan:  Principal Problem:   Bilateral pneumonia  Active Problems:   End stage renal disease ([place])   CAP (community acquired pneumonia)   Pancreas transplant status ([place])   Immunosuppression ([place])   Chest pain   Acute respiratory failure with hypoxia ([place])   Hyperkalemia  38 year old woman with a history of type 1 diabetes, kidney/pancreas transplant 2014, ESRD on HD after renal transplant failed, gastroparesis, on immunosuppression, admitted for 3 days of fever, cough, and shortness of breath.  She has had vomiting, diarrhea, myalgias and generalized weakness as well.  She missed HD on Wednesday due to her symptoms.  1.  Acute hypoxic respiratory failure 2/2 Bilateral pneumonia: slightly improved will less supplemental O2 support. With influenza-like illness and negative RVP. Even with negative SARS-CoV-2 testing x2, suspicion for COVID-19 remains elevated - Empiric antibiotics cefepime, vancomycin, azithromycin  - Quantiferon tb testing pending - Serum fungal testing - ID consulted- discussed with Dr Prince Rome today, pulmonology consulted Dr Lynetta Mare to evaluate and consider bronchoscopy/BAL - Continue oxygen support with goal O2 sat greater than 92%>> discussed with nurse to wean O2 as tolerated- has down well weaning this down. - Continue airborne and contact precautions while on aerosolyzing respiratory support - D/C schedule acetaminophen to trend fever curve  2.  ESRD with volume overload and hyperkalemia: - HD per Nephrology- resp status has improved with volume removal - Anemia slightly below baseline, will trend  3.  Chest pain: Suspect 2/2 to coughing -EKG reassuring - Troponin mildly elevated at 0.07> likely secondary to stress response and ESRD, do not suspect active ACS.  4.  Kidney/pancreas transplant on immunosuppression:  Renal transplant has failed, but pancreas transplant  is still functional. - Continue home prednisone 5 mg daily, cyclosporine 200 mg twice daily, mycophenolate 540 mg twice daily for now.  5. Anemia  of Chronic kidney disease - Overall decreased below 7 yesterday on CBC, given symptoms I transfused 1uPRBC, this has improved post to Hgb of 9. Repeat CBC tomorrow.   FEN: Renal diet Prophylaxis: Heparin Code Status: Full  Dispo: Requires inpatient care for bilateral pneumonia with hypoxia, respiratory status improving, likely will need inpatient care for 2 additional days.  Lucious Groves, DO  12/24/2018, 2:15 PM

## 2018-12-25 DIAGNOSIS — D899 Disorder involving the immune mechanism, unspecified: Secondary | ICD-10-CM

## 2018-12-25 DIAGNOSIS — J181 Lobar pneumonia, unspecified organism: Secondary | ICD-10-CM

## 2018-12-25 LAB — CBC WITH DIFFERENTIAL/PLATELET
Abs Immature Granulocytes: 0.25 10*3/uL — ABNORMAL HIGH (ref 0.00–0.07)
Basophils Absolute: 0 10*3/uL (ref 0.0–0.1)
Basophils Relative: 0 %
Eosinophils Absolute: 0.4 10*3/uL (ref 0.0–0.5)
Eosinophils Relative: 8 %
HCT: 26.8 % — ABNORMAL LOW (ref 36.0–46.0)
Hemoglobin: 8.9 g/dL — ABNORMAL LOW (ref 12.0–15.0)
Immature Granulocytes: 5 %
Lymphocytes Relative: 27 %
Lymphs Abs: 1.5 10*3/uL (ref 0.7–4.0)
MCH: 30.1 pg (ref 26.0–34.0)
MCHC: 33.2 g/dL (ref 30.0–36.0)
MCV: 90.5 fL (ref 80.0–100.0)
Monocytes Absolute: 0.5 10*3/uL (ref 0.1–1.0)
Monocytes Relative: 9 %
Neutro Abs: 2.9 10*3/uL (ref 1.7–7.7)
Neutrophils Relative %: 51 %
Platelets: 171 10*3/uL (ref 150–400)
RBC: 2.96 MIL/uL — ABNORMAL LOW (ref 3.87–5.11)
RDW: 14.4 % (ref 11.5–15.5)
WBC: 5.5 10*3/uL (ref 4.0–10.5)
nRBC: 0 % (ref 0.0–0.2)

## 2018-12-25 LAB — BASIC METABOLIC PANEL
Anion gap: 18 — ABNORMAL HIGH (ref 5–15)
BUN: 34 mg/dL — ABNORMAL HIGH (ref 6–20)
CO2: 23 mmol/L (ref 22–32)
Calcium: 8.9 mg/dL (ref 8.9–10.3)
Chloride: 97 mmol/L — ABNORMAL LOW (ref 98–111)
Creatinine, Ser: 7.38 mg/dL — ABNORMAL HIGH (ref 0.44–1.00)
GFR calc Af Amer: 7 mL/min — ABNORMAL LOW (ref >60)
GFR calc non Af Amer: 6 mL/min — ABNORMAL LOW (ref >60)
Glucose, Bld: 84 mg/dL (ref 70–99)
Potassium: 3.6 mmol/L (ref 3.5–5.1)
Sodium: 138 mmol/L (ref 135–145)

## 2018-12-25 LAB — CULTURE, BLOOD (ROUTINE X 2)
Culture: NO GROWTH
Culture: NO GROWTH
Special Requests: ADEQUATE
Special Requests: ADEQUATE

## 2018-12-25 NOTE — Progress Notes (Signed)
Informed earlier by IM resident I needed to discuss Covid test on a different medium with Critical Care MD. Discussed with doctor at this time and since it has to be addressed by Head of Microbiology on day shift this test needs to be addressed then not now.

## 2018-12-25 NOTE — Procedures (Signed)
I was present at this dialysis session. I have reviewed the session itself and made appropriate changes.   Vital signs in last 24 hours:  Temp:  [97.9 F (36.6 C)-98.4 F (36.9 C)] 97.9 F (36.6 C) (04/22 0759) Pulse Rate:  [87-96] 91 (04/22 1000) Resp:  [13-18] 16 (04/22 1000) BP: (129-193)/(64-77) 141/69 (04/22 1000) SpO2:  [97 %-100 %] 99 % (04/22 0759) Weight:  [63.2 kg] 63.2 kg (04/22 0759) Weight change:  Filed Weights   12/22/18 0040 12/23/18 1230 12/25/18 0759  Weight: 65 kg 63.8 kg 63.2 kg    Recent Labs  Lab 12/21/18 0737  12/25/18 0825  NA 135   < > 138  K 6.5*   < > 3.6  CL 93*   < > 97*  CO2 25   < > 23  GLUCOSE 95   < > 84  BUN 35*   < > 34*  CREATININE 6.39*   < > 7.38*  CALCIUM 7.9*   < > 8.9  PHOS 6.2*  --   --    < > = values in this interval not displayed.    Recent Labs  Lab 12/21/18 0539 12/23/18 1307 12/23/18 2036 12/25/18 0825  WBC 12.6* 3.7* 4.2 5.5  NEUTROABS 9.6* 2.2  --  2.9  HGB 7.3* 6.3* 9.0* 8.9*  HCT 24.1* 20.6* 27.4* 26.8*  MCV 97.6 95.4 90.4 90.5  PLT 158 132* 171 171    Scheduled Meds: . sodium chloride   Intravenous Once  . amLODipine  10 mg Oral QHS  . aspirin EC  81 mg Oral q morning - 10a  . Chlorhexidine Gluconate Cloth  6 each Topical Q0600  . cycloSPORINE modified  200 mg Oral BID  . darbepoetin (ARANESP) injection - DIALYSIS  150 mcg Intravenous Q Mon-HD  . dextromethorphan-guaiFENesin  1 tablet Oral BID  . heparin  5,000 Units Subcutaneous Q8H  . mycophenolate  540 mg Oral BID  . predniSONE  5 mg Oral Q breakfast  . sodium chloride flush  3 mL Intravenous Q12H   Continuous Infusions: . ceFEPime (MAXIPIME) IV 1 g (12/24/18 2254)  . vancomycin 750 mg (12/23/18 1439)   PRN Meds:.diphenhydrAMINE, HYDROmorphone (DILAUDID) injection, labetalol, ondansetron **OR** ondansetron (ZOFRAN) IV, polyethylene glycol    MWF at AF 4h 400/A1.5 63kg 2K/2.25Ca AVF Heparin 3000 bolus - Aranesp 15mcg IV weekly  (last given 4/13)  Assessment/ Plan:  1. Fever with hypoxemia and pulmonary infiltrates with respiratory distress. Negative covid x 2. ID following and felt this to likely be atypical pneumonia, concerning for fungal ie. Histoplasmosis or Aspergillus. May require pulmonary evaluation but improved after serial HD and UF. 2. ESRDwill continue with HD on MWF schedule and cont to challenge edw. 3. 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 Nutrition:renal diet 6. Hypertension:stable 7. 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 8. Hyperkalemia- resolved with HD.  [name] Potts,  MD 12/25/2018, 10:47 AM

## 2018-12-25 NOTE — Consult Note (Addendum)
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  Her symptoms were also associated with nausea, nonbloody-nonbilious vomiting, and watery nonbloody diarrhea (2-3 times per day). Her last HD session was on Monday 13th. She missed HD on Wednesday 15th. Her symptoms got worse led to presentation. She reports worsening generalized weakness and was not able to walk. Her HD session today canceled due to her fever and she was send to the ED for further evaluation. She lives with her husband who has been asymptomatic.Denies any recent traveling and sick contact. Tested on the 17th for COVID-2 test negative History of present illness   Clinically improved Concerns remain atypical pneumonia in an immunocompromised patient  Past Medical History   Past Medical History:  Diagnosis Date  . Anxiety   . Gastroparesis   . Narcotic abuse ([place])   . Non compliance with medical treatment   . Renal disorder   . Vision loss    Significant Hospital Events   Weaned off oxygen  Consults:  ID: Concern for immunocompromised pneumonia, fungal pneumonia-on immunosuppressants Renal: Continues on dialysis  Procedures:    Significant Diagnostic Tests:  CT abdomen and pelvis: IMPRESSION: 1. Findings in the lung bases are favored to reflect pulmonary edema from fluid overload, however, given the more dense consolidation in the left lower lobe, superimposed acute infection is not excluded, including viral pneumonia. 2. Small bilateral pleural effusions lying dependently. 3. Trace pericardial  fluid and/or thickening, unlikely to be of any hemodynamic significance at this time. 4. No acute findings are noted in the abdomen or pelvis. 5. Trace amount of perinephric fluid adjacent to the patient's right transplant kidney. The transplant kidney is otherwise normal in appearance. 6. Aortic atherosclerosis. 7. Additional incidental findings, as above.  Chest x-ray on 12/23/2018: Improvement in infiltrative process  Strep pneumo antigen negative Urine Legionella antigen negative Cryptococcal antigen negative ESR elevated at 51, ferritin of 300, CRP of 12.1 Micro Data:  Blood cultures 12/20/2018: Negative Fungitell within normal limits QuantiFERON gold negative Respiratory viral panel -12/20/2018 SARS Coronavirus 2- negative x2 on 12/20/2018  Antimicrobials:   Azithromycin 4/17 >> Cefepime 4/17>> Ceftriaxone 4/17 Vancomycin 4/18>> Interim history/subjective:  Clinically better Weaned off oxygen Continues to tolerate dialysis well Has been afebrile for 4 days  Objective   Blood pressure (!) 149/61, pulse 92, temperature 98.2 F (36.8 C), temperature source Oral, resp. rate 20, height '5\' 5"'  (1.651 m), weight 61.4 kg, last menstrual period 12/20/2018, SpO2 95 %.        Intake/Output Summary (Last 24 hours) at 12/25/2018 1443 Last data filed at 12/25/2018 1148 Gross per 24 hour  Intake 950 ml  Output 2251 ml  Net -1301 ml   Filed Weights   12/23/18 1230 12/25/18 0759 12/25/18 1148  Weight: 63.8 kg 63.2 kg 61.4 kg    Examination: General: Young lady, does not appear to be in distress HENT: Moist oral mucosa Lungs: Decreased air entry at the bases bilaterally Cardiovascular: S1-S2 appreciated Abdomen: Bowel sounds appreciated Extremities: No clubbing Neuro: alert and oriented x3 GU:   Resolved Hospital Problem list   Hypoxemia  Assessment & Plan:  Immunocompromised pneumonia -On multiple immunosuppressants   Bilateral multilobar pneumonia -Concern for  an atypical pneumonia -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  Community-acquired pneumonia -Has been on multiple antibiotics -Noted clinical improvement -Noted radiological improvement  End-stage renal disease on hemodialysis -Continues on dialysis -She did miss dialysis secondary to feeling poorly -Clinical improvement with being on dialysis and has been tolerating dialysis well  Immunosuppression History of transplant-renal/pancreas -On prednisone, cyclosporine, mycophenolate  Chest pain  -Unclear etiology -May be secondary to protracted coughing which is improving  Acute hypoxemic respiratory failure Currently on room air -Resolving  Patient has been on COVID isolation Had 2 tests negative-performed on the same platform within a few hours of each other Will send a swab, will request for test to be performed on a different platform and if negative patient may be the isolated -This will make planning and facilitating a bronchoscopy safer for all involved  Will benefit from bronchoscopy Safest course would be to await the results of testing Patient currently showing clinical improvement  Discussed with Dr. Paulino Door Best practice:   DVT prophylaxis: Heparin Mobility: Bedrest Code Status: Full code Family Communication:  Disposition:   Labs   CBC: Recent Labs  Lab 12/20/18 1318 12/20/18 2323 12/21/18 0539 12/23/18 1307 12/23/18 2036 12/25/18 0825  WBC 9.8 11.4* 12.6* 3.7* 4.2 5.5  NEUTROABS 7.4 9.4* 9.6* 2.2  --  2.9  HGB 8.2* 7.5* 7.3* 6.3* 9.0* 8.9*  HCT 26.5* 23.3* 24.1* 20.6* 27.4* 26.8*  MCV 99.3 94.3 97.6 95.4 90.4 90.5  PLT 171 167 158 132* 171 938    Basic Metabolic Panel: Recent Labs  Lab 12/21/18 0737 12/21/18 1029 12/22/18 0801 12/23/18 1327 12/24/18 0745 12/25/18 0825  NA 135 137 135 136 137 138  K 6.5* 5.8* 4.2 4.2 3.6 3.6  CL 93* 96* 94* 96* 95* 97*  CO2 '25 25 28 23 25 23  ' GLUCOSE 95 90 99 79 92 84  BUN  35* 39* 27* 47* 23* 34*  CREATININE 6.39* 6.82* 5.00* 8.12* 5.15* 7.38*  CALCIUM 7.9* 7.8* 8.0* 8.1* 8.6* 8.9  PHOS 6.2*  --   --   --   --   --    GFR: Estimated Creatinine Clearance: 9.5 mL/min (A) (by C-G formula based on SCr of 7.38 mg/dL (H)). Recent Labs  Lab 12/20/18 1318 12/20/18 2323 12/21/18 0539 12/21/18 0737 12/23/18 1307 12/23/18 2036 12/25/18 0825  PROCALCITON  --   --   --  <0.10  --   --   --   WBC 9.8 11.4* 12.6*  --  3.7* 4.2 5.5  LATICACIDVEN 1.4 1.7  --   --   --   --   --     Liver Function Tests: Recent Labs  Lab 12/20/18 1318 12/20/18 2323 12/21/18 0737  AST 11* 14*  --   ALT 15 15  --   ALKPHOS 57 44  --   BILITOT 0.6 0.7  --   PROT 6.9 6.3*  --   ALBUMIN 3.6 3.1* 3.1*   No results for input(s): LIPASE, AMYLASE in the last 168 hours. No results for input(s): AMMONIA in the last 168 hours.  ABG    Component Value Date/Time   PHART 7.469 (H) 12/20/2018 2325   PCO2ART 42.0 12/20/2018 2325   PO2ART 66.5 (L) 12/20/2018 2325   HCO3 29.3 (H) 12/20/2018 2325   TCO2 19.0 01/13/2015 0300   ACIDBASEDEF 5.2 (H) 01/13/2015 0300   O2SAT  89.3 12/20/2018 2325     Coagulation Profile: Recent Labs  Lab 12/20/18 1318  INR 1.2    Cardiac Enzymes: Recent Labs  Lab 12/20/18 1401 12/21/18 1029  CKTOTAL  --  120  TROPONINI <0.03 0.07*    HbA1C: Hgb A1c MFr Bld  Date/Time Value Ref Range Status  12/20/2018 11:23 PM 4.6 (L) 4.8 - 5.6 % Final    Comment:    (NOTE) Pre diabetes:          5.7%-6.4% Diabetes:              >6.4% Glycemic control for   <7.0% adults with diabetes   11/15/2011 10:50 PM 6.4 (H) <5.7 % Final    Comment:    (NOTE)                                                                       According to the ADA Clinical Practice Recommendations for 2011, when HbA1c is used as a screening test:  >=6.5%   Diagnostic of Diabetes Mellitus           (if abnormal result is confirmed) 5.7-6.4%   Increased risk of developing  Diabetes Mellitus References:Diagnosis and Classification of Diabetes Mellitus,Diabetes TMBP,1121,62(OECXF 1):S62-S69 and Standards of Medical Care in         Diabetes - 2011,Diabetes Care,2011,34 (Suppl 1):S11-S61.    CBG: Recent Labs  Lab 12/20/18 1837 12/20/18 1847 12/21/18 2100 12/22/18 0916 12/23/18 1735  GLUCAP 79 119* 88 89 72    Review of Systems:   Review of Systems  Respiratory: Positive for cough.   Cardiovascular: Positive for chest pain.  All others negative   Past Medical History  She,  has a past medical history of Anxiety, Gastroparesis, Narcotic abuse ([place]), Non compliance with medical treatment, Renal disorder, and Vision loss.   Surgical History    Past Surgical History:  Procedure Laterality Date  . AV FISTULA PLACEMENT  11/17/2011   Procedure: ARTERIOVENOUS (AV) FISTULA CREATION;  Surgeon: Rosetta Posner, MD;  Location: [facility];  Service: Vascular;  Laterality: Right;  . EYE SURGERY    . KIDNEY TRANSPLANT    . NEPHRECTOMY TRANSPLANTED ORGAN    . pancrease transplant    . REFRACTIVE SURGERY       Social History   reports that she has never smoked. She has never used smokeless tobacco. She reports that she does not drink alcohol or use drugs.   Family History   Her family history includes Hypertension in her father.   Allergies Allergies  Allergen Reactions  . Fish-Derived Products Swelling  . Shrimp [Shellfish Allergy] Rash

## 2018-12-25 NOTE — Progress Notes (Signed)
Per review of Infectious Disease progress note 12/24/18 - pt should remain on Airborne precautions.  RN notified.   Luther Hearing RN BSN  Infection Prevention

## 2018-12-25 NOTE — Progress Notes (Addendum)
Date: 12/25/2018  Patient name: [place] record number: [id number]  Date of birth: 1980/12/20    Subjective: Overall doing well, notes that she felt dizzy at end of HD session today and reports coming off a little early. Off supplemental O2 this morning, no complaints of SOB  Objective:  Vital signs in last 24 hours: Vitals:   12/25/18 1100 12/25/18 1130 12/25/18 1148 12/25/18 1200  BP: (!) 142/61 124/68 (!) 130/59 (!) 149/61  Pulse: 88 93 86 92  Resp: _0 Temp:   98 F (36.7 C) 98.2 F (36.8 C)  TempSrc:   Oral Oral  SpO2:   98% 95%  Weight:      Height:        Physical Exam: General: Lying in bed, appears comfortable, no distress HEENT: moist mucous membranes, no rhinorrhea Heart: RRR, no murmurs Lungs: normal resp rate, normal respiratory effort, lungs grossly clear on my exam Abdomen: Soft, nontender, normal bowel sounds Extremities: Warm, no edema, normal cap refill  Significant test results: ABG 7.47/42/67/29 Lactate 1.4, 1.7 Troponin negative Potassium 6.6, 4.5, 6.5 WBC 9.8, 11.4, 12.6 (16% lymphocytes) Hgb 8.2, 7.5, 7.3 Ferritin 300 ESR 51 CRP 12.1 Procalcitonin 0.1 D-dimer 2.1 UA large Hgb, 6-10 RBCs, 21-50 WBCs, nitrite negative Strep pneumo urine antigen negative Urine Legonella ag neg RPP negative SARS-CoV-2 negative x2 Cryptococcal ag negative -Quantiferon tb testing negative  CXR with bilateral diffuse opacities consistent with pulmonary vascular congestion and possible superimposed pneumonia or ARDS. CT abdomen shows bilateral diffuse groundglass opacities in the lung bases, small bilateral pleural effusions, trace pericardial effusion, and a trace amount of perinephric fluid around the transplanted kidney, but no other significant findings  CXR 4/20, reivewed, greatly improved bilateral aeration, mild increased cardiac silhouette however is portable AP. Assessment/Plan:  Principal Problem:   Bilateral pneumonia Active  Problems:   End stage renal disease ([place])   CAP (community acquired pneumonia)   Pancreas transplant status ([place])   Immunosuppression ([place])   Chest pain   Acute respiratory failure with hypoxia ([place])   Hyperkalemia  38 year old woman with a history of type 1 diabetes, kidney/pancreas transplant 2014, ESRD on HD after renal transplant failed, gastroparesis, on immunosuppression, admitted for 3 days of fever, cough, and shortness of breath.  She has had vomiting, diarrhea, myalgias and generalized weakness as well.  She missed HD on Wednesday due to her symptoms.  1.  Acute hypoxic respiratory failure 2/2 Bilateral pneumonia: slightly improved will less supplemental O2 support. With influenza-like illness and negative RVP. Even with negative SARS-CoV-2 testing x2, suspicion for COVID-19 remains elevated - Empiric antibiotics cefepime, vancomycin, azithromycin  - Serum fungal testing - ID consulted and following - Pulmonology consulted today 4/22> for evaluation and consideration of bronchoscopy and BAL for evaluation of LLL infiltrate. - Has been weaned off supplemental O2 - Continue airborne and contact precautions while on aerosolyzing respiratory support -  trend fever curve>> no fevers in last 24 hours off antipyretics  2.  ESRD with volume overload and hyperkalemia: - HD per Nephrology- resp status has improved with volume removal - Anemia slightly below baseline, will trend  3.  Chest pain: Suspect 2/2 to coughing -EKG reassuring - Troponin mildly elevated at 0.07> likely secondary to stress response and ESRD, do not suspect active ACS.  4.  Kidney/pancreas transplant on immunosuppression:  Renal transplant has failed, but pancreas transplant is still functional. - Continue home prednisone 5 mg daily, cyclosporine 200 mg twice daily, mycophenolate  540 mg twice daily for now.  5. Anemia of Chronic kidney disease - Received 1 unit pRBC this admission, stable.   FEN: Renal diet  Prophylaxis: Heparin Code Status: Full  Dispo: Now off suplemental O2, pending pulmonology evaluation possiblle discharge in next 1-2 days.  Lucious Groves, DO  12/25/2018, 1:26 PM

## 2018-12-26 LAB — NOVEL CORONAVIRUS, NAA (HOSP ORDER, SEND-OUT TO REF LAB; TAT 18-24 HRS): SARS-CoV-2, NAA: NOT DETECTED

## 2018-12-26 LAB — MISC LABCORP TEST (SEND OUT)

## 2018-12-26 MED ORDER — ONDANSETRON HCL 4 MG/2ML IJ SOLN
4.0000 mg | Freq: Once | INTRAMUSCULAR | Status: AC
  Administered 2018-12-26: 4 mg via INTRAVENOUS
  Filled 2018-12-26: qty 2

## 2018-12-26 MED ORDER — PROCHLORPERAZINE EDISYLATE 10 MG/2ML IJ SOLN
5.0000 mg | Freq: Once | INTRAMUSCULAR | Status: AC
  Administered 2018-12-26: 5 mg via INTRAVENOUS
  Filled 2018-12-26: qty 2

## 2018-12-26 NOTE — Progress Notes (Addendum)
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 Her symptoms were also associated with nausea, nonbloody-nonbilious vomiting, and watery nonbloody diarrhea (2-3 times per day). Her last HD session was on Monday 13th. She missed HD on Wednesday 15th. Her symptoms got worse led to presentation. She reports worsening generalized weakness and was not able to walk. Her HD session today canceled due to her fever and she was send to the ED for further evaluation. She lives with her husband who has been asymptomatic.Denies any recent traveling and sick contact. Tested on the 17th for COVID-2 test negative  History of present illness   Clinically improved Concerns remain atypical pneumonia in an immunocompromised patient  Past Medical History   Past Medical History:  Diagnosis Date  . Anxiety   . Gastroparesis   . Narcotic abuse ([place])   . Non compliance with medical treatment   . Renal disorder   . Vision loss     Significant Hospital Events   Off oxygen supplementation  Consults:  ID: Concern for immunocompromised pneumonia, fungal pneumonia-on immunosuppressants Renal: Continues on dialysis  Procedures:    Significant Diagnostic Tests:  CT abdomen and pelvis: IMPRESSION: 1. Findings in the lung bases are favored to reflect pulmonary edema from fluid overload, however, given the more dense consolidation in the left lower lobe, superimposed acute infection is not excluded, including viral pneumonia. 2. Small bilateral pleural effusions lying dependently. 3. Trace  pericardial fluid and/or thickening, unlikely to be of any hemodynamic significance at this time. 4. No acute findings are noted in the abdomen or pelvis. 5. Trace amount of perinephric fluid adjacent to the patient's right transplant kidney. The transplant kidney is otherwise normal in appearance. 6. Aortic atherosclerosis. 7. Additional incidental findings, as above.  Chest x-ray on 12/23/2018: Improvement in infiltrative process  Strep pneumo antigen negative Urine Legionella antigen negative Cryptococcal antigen negative ESR elevated at 51, ferritin of 300, CRP of 12.1  Micro Data:  Blood cultures 12/20/2018: Negative Fungitell within normal limits QuantiFERON gold negative Respiratory viral panel -12/20/2018 SARS Coronavirus 2- negative x2 on 12/20/2018  Antimicrobials:  Azithromycin 4/17 >> Cefepime 4/17>> Ceftriaxone 4/17 Vancomycin 4/18>> Interim history/subjective:  Clinically better Wean off oxygen Afebrile for 5 days  Objective   Blood pressure 127/75, pulse 82, temperature 98.2 F (36.8 C), temperature source Oral, resp. rate 16, height _0  (1.651 m), weight 61.4 kg, last menstrual period 12/20/2018, SpO2 98 %.       No intake or output data in the 24 hours ending 12/26/18 1412 Filed Weights   12/23/18 1230 12/25/18 0759 12/25/18 1148  Weight: 63.8 kg 63.2 kg 61.4 kg    Examination: General: Young lady, does not appear to be in distress HENT: Moist oral mucosa Lungs: Decreased air entry to bases bilaterally Cardiovascular: S1-S2 appreciated Abdomen: Bowel sounds appreciated Extremities: No clubbing, no edema Neuro: Alert and oriented x3 GU:   Resolved Hospital Problem list   Hypoxemia improved  Assessment & Plan:  Immunocompromised pneumonia Bilateral multilobar pneumonia Community-acquired pneumonia Has been on multiple immunosuppressants Concern for typical pneumonia 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 Heber [place]  Remains on CO VID isolation Had 2 tests negative performed within a few hours of each other, a repeat testing was requested Unclear at present whether it was performed on the same platform It was requested to be sent out to be tested on a different platform so that patient can be the isolated prior to a bronchoscopy I did place a call to microbiology manager-awaiting callback  Bronchoscopy will be scheduled following the isolation The risk to staff is very significant with an aerosolizing procedure Once with clear that she is negative, bronchoscopy can be scheduled   Bronchoscopy pending De- isolation  Best practice:  Diet: N.p.o. from midnight DVT prophylaxis: Heparin Mobility: Bedrest Code Status: Full code Family Communication:  Disposition:   Labs   CBC: Recent Labs  Lab 12/20/18 1318 12/20/18 2323 12/21/18 0539 12/23/18 1307 12/23/18 2036 12/25/18 0825  WBC 9.8 11.4* 12.6* 3.7* 4.2 5.5  NEUTROABS 7.4 9.4* 9.6* 2.2  --  2.9  HGB 8.2* 7.5* 7.3* 6.3* 9.0* 8.9*  HCT 26.5* 23.3* 24.1* 20.6* 27.4* 26.8*  MCV 99.3 94.3 97.6 95.4 90.4 90.5  PLT 171 167 158 132* 171 142    Basic Metabolic Panel: Recent Labs  Lab 12/21/18 0737 12/21/18 1029 12/22/18 0801 12/23/18 1327 12/24/18 0745 12/25/18 0825  NA 135 137 135 136 137 138  K 6.5* 5.8* 4.2 4.2 3.6 3.6  CL 93* 96* 94* 96* 95* 97*  CO2 _0 GLUCOSE 95 90 99 79 92 84  BUN 35* 39* 27* 47* 23* 34*  CREATININE 6.39* 6.82* 5.00* 8.12* 5.15* 7.38*  CALCIUM 7.9* 7.8* 8.0* 8.1* 8.6* 8.9  PHOS 6.2*  --   --   --    --   --    GFR: Estimated Creatinine Clearance: 9.5 mL/min (A) (by C-G formula based on SCr of 7.38 mg/dL (H)). Recent Labs  Lab 12/20/18 1318 12/20/18 2323 12/21/18 0539 12/21/18 0737 12/23/18 1307 12/23/18 2036 12/25/18 0825  PROCALCITON  --   --   --  <0.10  --   --   --   WBC 9.8 11.4* 12.6*  --  3.7* 4.2 5.5  LATICACIDVEN 1.4 1.7  --   --   --   --   --     Liver Function Tests: Recent Labs  Lab 12/20/18 1318 12/20/18 2323 12/21/18 0737  AST 11* 14*  --   ALT 15 15  --   ALKPHOS 57 44  --   BILITOT 0.6 0.7  --   PROT 6.9 6.3*  --   ALBUMIN 3.6 3.1* 3.1*   No results for input(s): LIPASE, AMYLASE in the last 168 hours. No results for input(s): AMMONIA in the last 168 hours.  ABG    Component Value Date/Time   PHART 7.469 (H) 12/20/2018 2325   PCO2ART 42.0 12/20/2018 2325   PO2ART 66.5 (L) 12/20/2018 2325   HCO3 29.3 (H) 12/20/2018 2325   TCO2 19.0 01/13/2015 0300   ACIDBASEDEF 5.2 (H) 01/13/2015 0300   O2SAT 89.3 12/20/2018 2325     Coagulation Profile: Recent Labs  Lab 12/20/18 1318  INR 1.2    Cardiac Enzymes: Recent Labs  Lab  12/20/18 1401 12/21/18 1029  CKTOTAL  --  120  TROPONINI <0.03 0.07*    HbA1C: Hgb A1c MFr Bld  Date/Time Value Ref Range Status  12/20/2018 11:23 PM 4.6 (L) 4.8 - 5.6 % Final    Comment:    (NOTE) Pre diabetes:          5.7%-6.4% Diabetes:              >6.4% Glycemic control for   <7.0% adults with diabetes   11/15/2011 10:50 PM 6.4 (H) <5.7 % Final    Comment:    (NOTE)                                                                       According to the ADA Clinical Practice Recommendations for 2011, when HbA1c is used as a screening test:  >=6.5%   Diagnostic of Diabetes Mellitus           (if abnormal result is confirmed) 5.7-6.4%   Increased risk of developing Diabetes Mellitus References:Diagnosis and Classification of Diabetes Mellitus,Diabetes RAQT,6226,33(HLKTG 1):S62-S69 and Standards of  Medical Care in         Diabetes - 2011,Diabetes Care,2011,34 (Suppl 1):S11-S61.    CBG: Recent Labs  Lab 12/20/18 1837 12/20/18 1847 12/21/18 2100 12/22/18 0916 12/23/18 1735  GLUCAP 79 119* 88 89 72    Review of Systems:   Shortness of breath is better Still has occasional chest discomfort from coughing Other review of systems negative  Past Medical History  She,  has a past medical history of Anxiety, Gastroparesis, Narcotic abuse ([place]), Non compliance with medical treatment, Renal disorder, and Vision loss.   Surgical History    Past Surgical History:  Procedure Laterality Date  . AV FISTULA PLACEMENT  11/17/2011   Procedure: ARTERIOVENOUS (AV) FISTULA CREATION;  Surgeon: Rosetta Posner, MD;  Location: [facility];  Service: Vascular;  Laterality: Right;  . EYE SURGERY    . KIDNEY TRANSPLANT    . NEPHRECTOMY TRANSPLANTED ORGAN    . pancrease transplant    . REFRACTIVE SURGERY       Social History   reports that she has never smoked. She has never used smokeless tobacco. She reports that she does not drink alcohol or use drugs.   Family History   Her family history includes Hypertension in her father.   Allergies Allergies  Allergen Reactions  . Fish-Derived Products Swelling  . Shrimp [Shellfish Allergy] Rash

## 2018-12-26 NOTE — Care Management Important Message (Signed)
Important Message  Patient Details  Name: Danetta Prom MRN: [id number] Date of Birth: 05-01-1981   Medicare Important Message Given:  Yes    Gerrianne Scale Jayleon Mcfarlane, LCSW 12/26/2018, 10:04 AM

## 2018-12-26 NOTE — Progress Notes (Signed)
  Date: 12/26/2018  Patient name: Hawi record number: [id number]  Date of birth: [birth year]    Subjective: c/o some continued chest pain, worse when coughing. Otherwise reports she is doing well.  Objective:  Vital signs in last 24 hours: Vitals:   12/25/18 1200 12/25/18 2100 12/25/18 2103 12/26/18 0735  BP: (!) 149/61  (!) 170/76 127/75  Pulse: 92  83 82  Resp: '20 16 16   '$ Temp: 98.2 F (36.8 C)  98.3 F (36.8 C) 98.2 F (36.8 C)  TempSrc: Oral  Oral Oral  SpO2: 95%  99% 98%  Weight:      Height:        Physical Exam: General: Lying in bed, appears comfortable, no distress Heart: RRR, no murmurs Lungs:lungs grossly clear bilaterally Abdomen: Soft, nontender, normal bowel sounds Extremities: Warm, no edema, normal cap refill  Significant test results: ABG 7.47/42/67/29 Lactate 1.4, 1.7 Troponin negative Potassium 6.6, 4.5, 6.5 WBC 9.8, 11.4, 12.6 (16% lymphocytes) Hgb 8.2, 7.5, 7.3 Ferritin 300 ESR 51 CRP 12.1 Procalcitonin 0.1 D-dimer 2.1 UA large Hgb, 6-10 RBCs, 21-50 WBCs, nitrite negative Strep pneumo urine antigen negative Urine Legonella ag neg RPP negative SARS-CoV-2 negative x2 Cryptococcal ag negative -Quantiferon tb testing negative -fungitell> neg -4/22 Novel coronavirus send out>> negative   CXR with bilateral diffuse opacities consistent with pulmonary vascular congestion and possible superimposed pneumonia or ARDS. CT abdomen shows bilateral diffuse groundglass opacities in the lung bases, small bilateral pleural effusions, trace pericardial effusion, and a trace amount of perinephric fluid around the transplanted kidney, but no other significant findings  CXR 4/20, reivewed, greatly improved bilateral aeration, mild increased cardiac silhouette however is portable AP. 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  She has had vomiting, diarrhea, myalgias and generalized weakness as well.  She missed HD on Wednesday due to her symptoms.  1.  Acute hypoxic respiratory failure (resolved) 2/2 Bilateral pneumonia - Empiric antibiotics cefepime, vancomycin, azithromycin  - ID consulted recommend bronch and bal - Pulmonology consulted 4/22> repeat Sars COV2 neg on different platform, plan for bronch   2.  ESRD with volume overload and hyperkalemia:  - HD per Nephrology -now euvolemenic and hyperkalemia resolved with HD   3.  Chest pain: Suspect 2/2 to coughing  4.  Kidney/pancreas transplant on immunosuppression:  Renal transplant has failed, but pancreas transplant is still functional. - Continue home prednisone 5 mg daily, cyclosporine 200 mg twice daily, mycophenolate 540 mg twice daily for now.  5. Anemia of Chronic kidney disease - Received 1 unit pRBC this admission, stable.   FEN: Renal diet Prophylaxis: Heparin Code Status: Full  Dispo: Now off suplemental O2, pending pulmonology evaluation/bronchoscopy.  Lucious Groves, DO  12/26/2018, 10:42 AM

## 2018-12-26 NOTE — Progress Notes (Signed)
Patient ID: [name] [name], female   DOB: [birth year], 38 y.o.   MRN: [id number] S:Pt seen by PCCM and planning on bronch O:BP 127/75   Pulse 82   Temp 98.2 F (36.8 C) (Oral)   Resp 16   Ht 5\' 5"  (1.651 m)   Wt 61.4 kg   LMP 12/20/2018   SpO2 98%   BMI 22.53 kg/m  No intake or output data in the 24 hours ending 12/26/18 1422 Intake/Output: I/O last 3 completed shifts: In: 950 [P.O.:600; IV Piggyback:350] Out: 2251 [Other:2251]  Intake/Output this shift:  No intake/output data recorded. Weight change:  Direct patient contact not performed due to concern for covid-19 infection.  Recent Labs  Lab 12/20/18 1318 12/20/18 2323 12/21/18 0737 12/21/18 1029 12/22/18 0801 12/23/18 1327 12/24/18 0745 12/25/18 0825  NA 140 136 135 137 135 136 137 138  K 6.6* 4.5 6.5* 5.8* 4.2 4.2 3.6 3.6  CL 102 96* 93* 96* 94* 96* 95* 97*  CO2 18* 26 25 25 28 23 25 23   GLUCOSE 86 99 95 90 99 79 92 84  BUN 81* 26* 35* 39* 27* 47* 23* 34*  CREATININE 11.52* 5.65* 6.39* 6.82* 5.00* 8.12* 5.15* 7.38*  ALBUMIN 3.6 3.1* 3.1*  --   --   --   --   --   CALCIUM 8.9 8.2* 7.9* 7.8* 8.0* 8.1* 8.6* 8.9  PHOS  --   --  6.2*  --   --   --   --   --   AST 11* 14*  --   --   --   --   --   --   ALT 15 15  --   --   --   --   --   --    Liver Function Tests: Recent Labs  Lab 12/20/18 1318 12/20/18 2323 12/21/18 0737  AST 11* 14*  --   ALT 15 15  --   ALKPHOS 57 44  --   BILITOT 0.6 0.7  --   PROT 6.9 6.3*  --   ALBUMIN 3.6 3.1* 3.1*   No results for input(s): LIPASE, AMYLASE in the last 168 hours. No results for input(s): AMMONIA in the last 168 hours. CBC: Recent Labs  Lab 12/20/18 2323 12/21/18 0539 12/23/18 1307 12/23/18 2036 12/25/18 0825  WBC 11.4* 12.6* 3.7* 4.2 5.5  NEUTROABS 9.4* 9.6* 2.2  --  2.9  HGB 7.5* 7.3* 6.3* 9.0* 8.9*  HCT 23.3* 24.1* 20.6* 27.4* 26.8*  MCV 94.3 97.6 95.4 90.4 90.5  PLT 167 158 132* 171 171   Cardiac Enzymes: Recent Labs  Lab 12/20/18 1401 12/21/18 1029   CKTOTAL  --  120  TROPONINI <0.03 0.07*   CBG: Recent Labs  Lab 12/20/18 1837 12/20/18 1847 12/21/18 2100 12/22/18 0916 12/23/18 1735  GLUCAP 79 119* 88 89 72    Iron Studies: No results for input(s): IRON, TIBC, TRANSFERRIN, FERRITIN in the last 72 hours. Studies/Results: No results found. . sodium chloride   Intravenous Once  . amLODipine  10 mg Oral QHS  . aspirin EC  81 mg Oral q morning - 10a  . Chlorhexidine Gluconate Cloth  6 each Topical Q0600  . cycloSPORINE modified  200 mg Oral BID  . darbepoetin (ARANESP) injection - DIALYSIS  150 mcg Intravenous Q Mon-HD  . dextromethorphan-guaiFENesin  1 tablet Oral BID  . heparin  5,000 Units Subcutaneous Q8H  . mycophenolate  540 mg Oral BID  . predniSONE  5 mg  Oral Q breakfast  . sodium chloride flush  3 mL Intravenous Q12H    BMET    Component Value Date/Time   NA 138 12/25/2018 0825   K 3.6 12/25/2018 0825   CL 97 (L) 12/25/2018 0825   CO2 23 12/25/2018 0825   GLUCOSE 84 12/25/2018 0825   BUN 34 (H) 12/25/2018 0825   CREATININE 7.38 (H) 12/25/2018 0825   CALCIUM 8.9 12/25/2018 0825   GFRNONAA 6 (L) 12/25/2018 0825   GFRAA 7 (L) 12/25/2018 0825   CBC    Component Value Date/Time   WBC 5.5 12/25/2018 0825   RBC 2.96 (L) 12/25/2018 0825   HGB 8.9 (L) 12/25/2018 0825   HCT 26.8 (L) 12/25/2018 0825   PLT 171 12/25/2018 0825   MCV 90.5 12/25/2018 0825   MCH 30.1 12/25/2018 0825   MCHC 33.2 12/25/2018 0825   RDW 14.4 12/25/2018 0825   LYMPHSABS 1.5 12/25/2018 0825   MONOABS 0.5 12/25/2018 0825   EOSABS 0.4 12/25/2018 0825   BASOSABS 0.0 12/25/2018 0825    MWF at AF 4h 400/A1.5 63kg 2K/2.25Ca AVF Heparin 3000 bolus - Aranesp 172mcg IV weekly (last given 4/13)  Assessment/ Plan:  1. Fever with hypoxemia and pulmonary infiltrates with respiratory distress. Negative covid x 3 on 2 different mechanisms. ID following and felt this to likely be atypical pneumonia, concerning for fungal ie.  Histoplasmosis or Aspergillus.  1. PCCM consulted and plan for bronch 2. Improved after serial HD and UF. 2. ESRDwillcontinue with HD on MWF scheduleand cont to challenge edw. 3. 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 Nutrition:renal diet 6. Hypertension:stable 7. 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 8. Hyperkalemia- resolvedwith HD.   Donetta Potts, MD [employer] [phone]

## 2018-12-27 DIAGNOSIS — R05 Cough: Secondary | ICD-10-CM

## 2018-12-27 DIAGNOSIS — J9601 Acute respiratory failure with hypoxia: Secondary | ICD-10-CM

## 2018-12-27 DIAGNOSIS — I700 Atherosclerosis of aorta: Secondary | ICD-10-CM

## 2018-12-27 DIAGNOSIS — R0902 Hypoxemia: Secondary | ICD-10-CM

## 2018-12-27 LAB — CBC
HCT: 30.8 % — ABNORMAL LOW (ref 36.0–46.0)
Hemoglobin: 10 g/dL — ABNORMAL LOW (ref 12.0–15.0)
MCH: 29.9 pg (ref 26.0–34.0)
MCHC: 32.5 g/dL (ref 30.0–36.0)
MCV: 92.2 fL (ref 80.0–100.0)
Platelets: 210 10*3/uL (ref 150–400)
RBC: 3.34 MIL/uL — ABNORMAL LOW (ref 3.87–5.11)
RDW: 15.3 % (ref 11.5–15.5)
WBC: 6.9 10*3/uL (ref 4.0–10.5)
nRBC: 0 % (ref 0.0–0.2)

## 2018-12-27 LAB — RENAL FUNCTION PANEL
Albumin: 3.2 g/dL — ABNORMAL LOW (ref 3.5–5.0)
Anion gap: 17 — ABNORMAL HIGH (ref 5–15)
BUN: 32 mg/dL — ABNORMAL HIGH (ref 6–20)
CO2: 25 mmol/L (ref 22–32)
Calcium: 9.6 mg/dL (ref 8.9–10.3)
Chloride: 96 mmol/L — ABNORMAL LOW (ref 98–111)
Creatinine, Ser: 8.48 mg/dL — ABNORMAL HIGH (ref 0.44–1.00)
GFR calc Af Amer: 6 mL/min — ABNORMAL LOW (ref >60)
GFR calc non Af Amer: 5 mL/min — ABNORMAL LOW (ref >60)
Glucose, Bld: 117 mg/dL — ABNORMAL HIGH (ref 70–99)
Phosphorus: 7.4 mg/dL — ABNORMAL HIGH (ref 2.5–4.6)
Potassium: 3.7 mmol/L (ref 3.5–5.1)
Sodium: 138 mmol/L (ref 135–145)

## 2018-12-27 LAB — ASPERGILLUS ANTIGEN, BAL/SERUM: Aspergillus Ag, BAL/Serum: 0.04 Index (ref 0.00–0.49)

## 2018-12-27 MED ORDER — HEPARIN SODIUM (PORCINE) 1000 UNIT/ML DIALYSIS: 20.0000 [IU]/kg | INTRAMUSCULAR | Status: DC | PRN

## 2018-12-27 MED ORDER — VANCOMYCIN HCL IN DEXTROSE 750-5 MG/150ML-% IV SOLN
INTRAVENOUS | Status: AC
  Filled 2018-12-27: qty 150

## 2018-12-27 MED ORDER — DIPHENHYDRAMINE HCL 50 MG/ML IJ SOLN: 12.5000 mg | Freq: Once | INTRAMUSCULAR | Status: DC

## 2018-12-27 NOTE — Discharge Summary (Signed)
Name: [name] [name] MRN: [id number] DOB: 1981-06-10 38 y.o. PCP: Patient, No Pcp Per  Date of Admission: 12/20/2018 12:55 PM Date of Discharge: 12/27/2018 Attending Physician: Lucious Groves, DO  Discharge Diagnosis: Principal Problem:   Acute respiratory failure with hypoxia [facility]) Active Problems:   End stage renal disease ([place])   CAP (community acquired pneumonia)   Bilateral pneumonia   Pancreas transplant status ([place])   Immunosuppression ([place])   Chest pain   Hyperkalemia   Aortic atherosclerosis ([place])    Discharge Medications: Allergies as of 12/27/2018      Reactions   Fish-derived Products Swelling   Shrimp [shellfish Allergy] Rash      Medication List    STOP taking these medications   cephALEXin 500 MG capsule Commonly known as:  KEFLEX   metroNIDAZOLE 500 MG tablet Commonly known as:  Flagyl   oxyCODONE-acetaminophen 5-325 MG tablet Commonly known as:  Percocet     TAKE these medications   acetaminophen 500 MG tablet Commonly known as:  TYLENOL Take 1,000 mg by mouth every 6 (six) hours as needed for mild pain.   amLODipine 10 MG tablet Commonly known as:  NORVASC Take 10 mg by mouth daily.   aspirin EC 81 MG tablet Take 81 mg by mouth every morning.   cycloSPORINE modified 100 MG capsule Commonly known as:  NEORAL Take 200 mg by mouth 2 (two) times daily.   mycophenolate 180 MG EC tablet Commonly known as:  MYFORTIC Take 540 mg by mouth 2 (two) times daily.   predniSONE 5 MG tablet Commonly known as:  DELTASONE Take 5 mg by mouth daily.   promethazine 25 MG suppository Commonly known as:  PHENERGAN Place 1 suppository (25 mg total) rectally every 6 (six) hours as needed for nausea or vomiting.       Disposition and follow-up:   [name] was discharged from [facility] in Good condition.  At the hospital follow up visit please address:  1.  Any recurrent Fevers or increased SOB.  2.  Labs / imaging needed at  time of follow-up: none  3.  Pending labs/ test needing follow-up: none  Follow-up Appointments: Follow-up [place] Follow up on 01/02/2019.   Why:  10:10 for telehealth follow up Contact information: [place] [phone]          Hospital Course by problem list:   Acute respiratory failure with hypoxia ([place]) 2/2 Pneumonia and pulmonary edema from hypervolemia in the setting of ESRD Patient was admitted for fevers and shortness of breath.  Initial chest x-ray showed bilateral pulmonary infiltrates.  Given that she is immunosuppressed there was a broad differential she was started on broad-spectrum IV antibiotics.  She was also tested for the novel coronavirus which was negative.  Infectious disease as well as pulmonology were consulted with her care.  We obtained several serum blood markers for signs of fungal disease however they were negative a QuantiFERON was also negative.  Her fevers abated on broad-spectrum antibiotics.  Her pulmonary infiltrates were markedly reduced with volume removal with hemodialysis.  Gradually her supplemental oxygen requirement was weaned and by the time of discharge she was doing very well on room air.  We did consider bronchoscopy for evaluation of her suspected pneumonia however this was initially deferred due to suspicion of coronavirus and by the time of discharge given her clinical improvement it was felt that  the risk did not outweigh the benefits.  She has completed 6 days of IV antibiotics in the hospital given that she has ESRD upon dosing of her cefepime today this will cover through 7 days.  No further antibiotics were recommended at this time.     Pancreas transplant status (HCC)/ Immunosuppression ([place]) -She was continued on her immunosuppression regimen while inpatient this was not adjusted.    Chest pain Associated with cough, EKG and troponins  reassuring improving at time of discharge     Hyperkalemia Resolved with hemodialysis  Normocytic anemia - secondary to ESRD however appeared to have a drop to 6.8 was transfused 1uPRBC and post remained stable at ~9.  Aortic atherosclerosis - noted on CT.   Discharge Vitals:   BP 120/80 (BP Location: Left Arm)   Pulse 86   Temp 98.2 F (36.8 C) (Oral)   Resp 18   Ht '5\' 5"'  (1.651 m)   Wt 59.5 kg   LMP 12/20/2018   SpO2 94%   BMI 21.83 kg/m   Pertinent Labs, Studies, and Procedures:  ABG 7.47/42/67/29 Lactate 1.4, 1.7 Troponin negative Potassium 6.6, 4.5, 6.5 WBC 9.8, 11.4, 12.6 (16% lymphocytes) Hgb 8.2, 7.5, 7.3 Ferritin 300 ESR 51 CRP 12.1 Procalcitonin 0.1 D-dimer 2.1 UA large Hgb, 6-10 RBCs, 21-50 WBCs, nitrite negative Strep pneumo urine antigen negative Urine Legonella ag neg RPP negative SARS-CoV-2 negative x2 Cryptococcal ag negative -Quantiferon tb testing negative -fungitell> neg -4/22 Novel coronavirus send out>> negative  CT abd/pelvis w contrast 12/20/18 IMPRESSION: 1. Findings in the lung bases are favored to reflect pulmonary edema from fluid overload, however, given the more dense consolidation in the left lower lobe, superimposed acute infection is not excluded, including viral pneumonia. 2. Small bilateral pleural effusions lying dependently. 3. Trace pericardial fluid and/or thickening, unlikely to be of any hemodynamic significance at this time. 4. No acute findings are noted in the abdomen or pelvis. 5. Trace amount of perinephric fluid adjacent to the patient's right transplant kidney. The transplant kidney is otherwise normal in appearance. 6. Aortic atherosclerosis. 7. Additional incidental findings, as above.  CXR 12/21/18 IMPRESSION: Stable enlarged cardiac silhouette. Mild worsening of diffuse pulmonary opacities.  CXR 12/23/18 IMPRESSION: 1. Considerable improvement in bilateral airspace disease most suggestive of  resolved pulmonary edema. 2. Mild LEFT lower lobe airspace disease remains which could represent superimposed pneumonia. 3. Mild interval increase in cardiac silhouette consistent with cardiomegaly versus pericardial effusion.   Discharge Instructions: Discharge Instructions    Call MD for:  persistant nausea and vomiting   Complete by:  As directed    Call MD for:  temperature >100.4   Complete by:  As directed    Diet - low sodium heart healthy   Complete by:  As directed    Discharge instructions   Complete by:  As directed    It was a pleasure to take care of you [name]. You were hospitalized for acute respiratory distress secondary to a pneumonia for which you were treated.   Increase activity slowly   Complete by:  As directed       Signed: Lucious Groves, DO 12/27/2018, 4:50 PM

## 2018-12-27 NOTE — Progress Notes (Signed)
NAMEAshliegh [name], MRN:  [id number], DOB:  1981/03/05, LOS: 7 ADMISSION DATE:  12/20/2018, CONSULTATION DATE: 12/25/2018 REFERRING MD: Joni Reining, CHIEF COMPLAINT: Pneumonia  Brief History   38 year old female with past medical history of ESRD on HD MWF (Failed kidney transplant), DM1(S/P pancreas transplant 2014 on immunosuppression) and, gastroparesis, presented with 3 days history of fever up to 102.3 F, chills, dry cough, SOB and worsening generalized body ache. Her symptoms were also associated with nausea, nonbloody-nonbilious vomiting, and watery nonbloody diarrhea (2-3 times per day). Her last HD session was on Monday 13th. She missed HD on Wednesday 15th. Her symptoms got worse led to presentation. She reports worsening generalized weakness and was not able to walk. Her HD session today canceled due to her fever and she was send to the ED for further evaluation. She lives with her husband who has been asymptomatic.Denies any recent traveling and sick contact. Tested on the 17th for COVID-2 test negative  History of present illness   Clinically improved Concerns remain atypical pneumonia in an immunocompromised patient  Past Medical History   Past Medical History:  Diagnosis Date  . Anxiety   . Gastroparesis   . Narcotic abuse ([place])   . Non compliance with medical treatment   . Renal disorder   . Vision loss     Significant Hospital Events   Off oxygen supplementation  Consults:  ID: Concern for immunocompromised pneumonia, fungal pneumonia-on immunosuppressants Renal: Continues on dialysis  Procedures:    Significant Diagnostic Tests:  CT abdomen and pelvis: IMPRESSION: 1. Findings in the lung bases are favored to reflect pulmonary edema from fluid overload, however, given the more dense consolidation in the left lower lobe, superimposed acute infection is not excluded, including viral pneumonia. 2. Small bilateral pleural effusions lying dependently. 3. Trace  pericardial fluid and/or thickening, unlikely to be of any hemodynamic significance at this time. 4. No acute findings are noted in the abdomen or pelvis. 5. Trace amount of perinephric fluid adjacent to the patient's right transplant kidney. The transplant kidney is otherwise normal in appearance. 6. Aortic atherosclerosis. 7. Additional incidental findings, as above.  Chest x-ray on 12/23/2018: Improvement in infiltrative process  Strep pneumo antigen negative Urine Legionella antigen negative Cryptococcal antigen negative ESR elevated at 51, ferritin of 300, CRP of 12.1  Micro Data:  Blood cultures 12/20/2018: Negative Fungitell within normal limits QuantiFERON gold negative Respiratory viral panel -12/20/2018 SARS Coronavirus 2- negative x2 on 12/20/2018  Antimicrobials:  Azithromycin 4/17 >> Cefepime 4/17>> Ceftriaxone 4/17 Vancomycin 4/18>> Interim history/subjective:  No events overnight, afebrile, off O2  Objective   Blood pressure 132/72, pulse 85, temperature 98.2 F (36.8 C), temperature source Oral, resp. rate 16, height _0  (1.651 m), weight 60.3 kg, last menstrual period 12/20/2018, SpO2 94 %.        Intake/Output Summary (Last 24 hours) at 12/27/2018 1155 Last data filed at 12/26/2018 1800 Gross per 24 hour  Intake 150 ml  Output -  Net 150 ml   Filed Weights   12/25/18 0759 12/25/18 1148 12/27/18 1100  Weight: 63.2 kg 61.4 kg 60.3 kg    Examination: General: Well appearing, NAD HENT: [place]/AT, PERRL, EOM-I and MMM Lungs: CTA bilaterally Cardiovascular: RRR, Nl S1/S2 and -M/R/G Abdomen: Soft, NT, ND and +BS Extremities: No clubbing, no edema Neuro: Alert and oriented x3 Skin: intact  I reviewed CXR myself, significant clearance noted  Resolved Hospital Problem list   Hypoxemia improved  Assessment & Plan:  Immunocompromised pneumonia  Bilateral multilobar pneumonia Community-acquired pneumonia I do not believe this is a true pneumonia,  patient is asymptomatic, afebrile, on RA and CXR is clearing No indication for bronchoscopy at this point since it is quickly resolving  Hypoxemia: Resolved, off O2 May consider an ambulatory desaturation study prior to discharge but highly doubt will need home O2  End-stage renal disease on hemodialysis HD per renal Tolerating dialysis well  Immunosuppression On CellCept, cyclosporine, mycophenolate  Chest pain Likely related to protracted cough  Acute hypoxemic respiratory failure -Resolving  Cough: PRN cough suppressants  Remains on CO VID isolation If there is a concern for additional pulmonary infection may need to consider a consultation with the infectious disease service  Discussed with PCCM-NP.  PCCM will sign off, please call back if needed.  Best practice:  Diet: N.p.o. from midnight DVT prophylaxis: Heparin Mobility: Bedrest Code Status: Full code Family Communication:  Disposition:   Labs   CBC: Recent Labs  Lab 12/20/18 1318 12/20/18 2323 12/21/18 0539 12/23/18 1307 12/23/18 2036 12/25/18 0825 12/27/18 1107  WBC 9.8 11.4* 12.6* 3.7* 4.2 5.5 6.9  NEUTROABS 7.4 9.4* 9.6* 2.2  --  2.9  --   HGB 8.2* 7.5* 7.3* 6.3* 9.0* 8.9* 10.0*  HCT 26.5* 23.3* 24.1* 20.6* 27.4* 26.8* 30.8*  MCV 99.3 94.3 97.6 95.4 90.4 90.5 92.2  PLT 171 167 158 132* 171 171 974    Basic Metabolic Panel: Recent Labs  Lab 12/21/18 0737  12/22/18 0801 12/23/18 1327 12/24/18 0745 12/25/18 0825 12/27/18 1107  NA 135   < > 135 136 137 138 138  K 6.5*   < > 4.2 4.2 3.6 3.6 3.7  CL 93*   < > 94* 96* 95* 97* 96*  CO2 25   < > _0 GLUCOSE 95   < > 99 79 92 84 117*  BUN 35*   < > 27* 47* 23* 34* 32*  CREATININE 6.39*   < > 5.00* 8.12* 5.15* 7.38* 8.48*  CALCIUM 7.9*   < > 8.0* 8.1* 8.6* 8.9 9.6  PHOS 6.2*  --   --   --   --   --  7.4*   < > = values in this interval not displayed.   GFR: Estimated Creatinine Clearance: 8.3 mL/min (A) (by C-G formula based  on SCr of 8.48 mg/dL (H)). Recent Labs  Lab 12/20/18 1318 12/20/18 2323  12/21/18 0737 12/23/18 1307 12/23/18 2036 12/25/18 0825 12/27/18 1107  PROCALCITON  --   --   --  <0.10  --   --   --   --   WBC 9.8 11.4*   < >  --  3.7* 4.2 5.5 6.9  LATICACIDVEN 1.4 1.7  --   --   --   --   --   --    < > = values in this interval not displayed.    Liver Function Tests: Recent Labs  Lab 12/20/18 1318 12/20/18 2323 12/21/18 0737 12/27/18 1107  AST 11* 14*  --   --   ALT 15 15  --   --   ALKPHOS 57 44  --   --   BILITOT 0.6 0.7  --   --   PROT 6.9 6.3*  --   --   ALBUMIN 3.6 3.1* 3.1* 3.2*   No results for input(s): LIPASE, AMYLASE in the last 168 hours. No results for input(s): AMMONIA in the last 168 hours.  ABG  Component Value Date/Time   PHART 7.469 (H) 12/20/2018 2325   PCO2ART 42.0 12/20/2018 2325   PO2ART 66.5 (L) 12/20/2018 2325   HCO3 29.3 (H) 12/20/2018 2325   TCO2 19.0 01/13/2015 0300   ACIDBASEDEF 5.2 (H) 01/13/2015 0300   O2SAT 89.3 12/20/2018 2325     Coagulation Profile: Recent Labs  Lab 12/20/18 1318  INR 1.2    Cardiac Enzymes: Recent Labs  Lab 12/20/18 1401 12/21/18 1029  CKTOTAL  --  120  TROPONINI <0.03 0.07*    HbA1C: Hgb A1c MFr Bld  Date/Time Value Ref Range Status  12/20/2018 11:23 PM 4.6 (L) 4.8 - 5.6 % Final    Comment:    (NOTE) Pre diabetes:          5.7%-6.4% Diabetes:              >6.4% Glycemic control for   <7.0% adults with diabetes   11/15/2011 10:50 PM 6.4 (H) <5.7 % Final    Comment:    (NOTE)                                                                       According to the ADA Clinical Practice Recommendations for 2011, when HbA1c is used as a screening test:  >=6.5%   Diagnostic of Diabetes Mellitus           (if abnormal result is confirmed) 5.7-6.4%   Increased risk of developing Diabetes Mellitus References:Diagnosis and Classification of Diabetes Mellitus,Diabetes DZHG,9924,26(STMHD 1):S62-S69 and  Standards of Medical Care in         Diabetes - 2011,Diabetes Care,2011,34 (Suppl 1):S11-S61.    CBG: Recent Labs  Lab 12/20/18 1837 12/20/18 1847 12/21/18 2100 12/22/18 0916 12/23/18 1735  GLUCAP 79 119* 88 89 72   Rush Farmer, M.D. [facility] Pulmonary/Critical Care Medicine. Pager: [phone]. After hours pager: [phone].

## 2018-12-27 NOTE — Procedures (Addendum)
I was present at this dialysis session. I have reviewed the session itself and made appropriate changes.   Vital signs in last 24 hours:  Temp:  [97.5 F (36.4 C)-98.2 F (36.8 C)] 98.2 F (36.8 C) (04/24 1100) Pulse Rate:  [81-92] 92 (04/24 1300) Resp:  [15-23] 15 (04/24 1215) BP: (116-149)/(64-86) 121/70 (04/24 1300) SpO2:  [94 %-98 %] 94 % (04/24 1100) Weight:  [60.3 kg] 60.3 kg (04/24 1100) Weight change:  Filed Weights   12/25/18 0759 12/25/18 1148 12/27/18 1100  Weight: 63.2 kg 61.4 kg 60.3 kg    Recent Labs  Lab 12/27/18 1107  NA 138  K 3.7  CL 96*  CO2 25  GLUCOSE 117*  BUN 32*  CREATININE 8.48*  CALCIUM 9.6  PHOS 7.4*    Recent Labs  Lab 12/21/18 0539 12/23/18 1307 12/23/18 2036 12/25/18 0825 12/27/18 1107  WBC 12.6* 3.7* 4.2 5.5 6.9  NEUTROABS 9.6* 2.2  --  2.9  --   HGB 7.3* 6.3* 9.0* 8.9* 10.0*  HCT 24.1* 20.6* 27.4* 26.8* 30.8*  MCV 97.6 95.4 90.4 90.5 92.2  PLT 158 132* 171 171 210    Scheduled Meds: . sodium chloride   Intravenous Once  . amLODipine  10 mg Oral QHS  . aspirin EC  81 mg Oral q morning - 10a  . Chlorhexidine Gluconate Cloth  6 each Topical Q0600  . cycloSPORINE modified  200 mg Oral BID  . darbepoetin (ARANESP) injection - DIALYSIS  150 mcg Intravenous Q Mon-HD  . dextromethorphan-guaiFENesin  1 tablet Oral BID  . diphenhydrAMINE  12.5 mg Intravenous Once  . heparin  5,000 Units Subcutaneous Q8H  . mycophenolate  540 mg Oral BID  . predniSONE  5 mg Oral Q breakfast  . sodium chloride flush  3 mL Intravenous Q12H   Continuous Infusions: . ceFEPime (MAXIPIME) IV Stopped (12/26/18 1930)  . Vancomycin     PRN Meds:.diphenhydrAMINE, heparin, HYDROmorphone (DILAUDID) injection, labetalol, ondansetron **OR** ondansetron (ZOFRAN) IV, polyethylene glycol    MWF at AF 4h 400/A1.5 63kg 2K/2.25Ca AVF Heparin 3000 bolus - Aranesp 142mcg IV weekly (last given 4/13)  Assessment/ Plan:  1. Fever with hypoxemia and  pulmonary infiltrates with respiratory distress. Negative covid x 3 on 2 different mechanisms. ID following and felt this to likely be atypical pneumonia, concerning for fungal ie. Histoplasmosis or Aspergillus.  1. PCCM consulted and plan for bronch 2. Improved after serial HD and UF. 2. ESRDwillcontinue with HD on MWF scheduleand cont to challenge edw. 3. 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 Nutrition:renal diet 6. Hypertension:stable 7. 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 8. Hyperkalemia- resolvedwith HD. 9. Disposition- for discharge after HD today.   [name] Potts,  MD 12/27/2018, 1:39 PM

## 2018-12-27 NOTE — Progress Notes (Signed)
Spoke with Baxter Flattery with respiratory therapy about possible bronchoscopy today, unfortunately there is no availably on the schedule. Dr. Vaughan Browner with PCCM informed, patients diet switched from NPO to renal.   Rush Landmark, RN

## 2018-12-27 NOTE — Progress Notes (Signed)
..  Patient given all discharge paper work. All pages reviewed and subsequent questions answered. Patients telemetry and peripheral IV's discontinued without complications. Patients belongings returned. Patient placed in wheel chair and escorted by staff member to pick up location.  Bill RN     

## 2018-12-27 NOTE — Progress Notes (Signed)
  Date: 12/27/2018  Patient name: [name] [name] record number: [id number]  Date of birth: Feb 18, 1981    Subjective: chest pain and cough gradually improving, remains febrile and without increased SOB.  Objective:  Vital signs in last 24 hours: Vitals:   12/27/18 1200 12/27/18 1215 12/27/18 1230 12/27/18 1300  BP: 138/66 133/73 116/64 121/70  Pulse: 89 86 88 92  Resp:  15    Temp:      TempSrc:      SpO2:      Weight:      Height:        Physical Exam: General: on HD, Lying in bed, appears comfortable, no distress Heart: RRR, no murmurs Lungs:lungs grossly clear bilaterally from anterior fields Abdomen: Soft, nontender, normal bowel sounds Extremities: Warm, no edema, normal cap refill  Significant test results: ABG 7.47/42/67/29 Lactate 1.4, 1.7 Troponin negative Potassium 6.6, 4.5, 6.5 WBC 9.8, 11.4, 12.6 (16% lymphocytes) Hgb 8.2, 7.5, 7.3 Ferritin 300 ESR 51 CRP 12.1 Procalcitonin 0.1 D-dimer 2.1 UA large Hgb, 6-10 RBCs, 21-50 WBCs, nitrite negative Strep pneumo urine antigen negative Urine Legonella ag neg RPP negative SARS-CoV-2 negative x2 Cryptococcal ag negative -Quantiferon tb testing negative -fungitell> neg -4/22 Novel coronavirus send out>> negative   CXR with bilateral diffuse opacities consistent with pulmonary vascular congestion and possible superimposed pneumonia or ARDS. CT abdomen shows bilateral diffuse groundglass opacities in the lung bases, small bilateral pleural effusions, trace pericardial effusion, and a trace amount of perinephric fluid around the transplanted kidney, but no other significant findings  CXR 4/20, reivewed, greatly improved bilateral aeration, mild increased cardiac silhouette however is portable AP. 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  She has had vomiting, diarrhea, myalgias and generalized weakness as well.  She missed HD on Wednesday due to her symptoms.  1.  Acute hypoxic respiratory failure (resolved) 2/2 Bilateral pneumonia - Empiric antibiotics cefepime, vancomycin, azithromycin  - ID consulted recommend bronch and bal - Confirmed repeat SARSCOV2 was prefromed on different platform. Discussed with Dr Nelda Marseille, given patient clinically doing well and pulm infiltrates greatly improved after serial HD recommends no bronchoscopy.  I discussed this with ID dr powers, if no bronch would be OK to discharge home after cefepime is dose post dialysis.    2.  ESRD with volume overload and hyperkalemia:  - HD per Nephrology -now euvolemenic and hyperkalemia resolved with HD   3.  Chest pain: Suspect 2/2 to coughing, improving  4.  Kidney/pancreas transplant on immunosuppression:  Renal transplant has failed, but pancreas transplant is still functional. - Continue home prednisone 5 mg daily, cyclosporine 200 mg twice daily, mycophenolate 540 mg twice daily for now.  5. Anemia of Chronic kidney disease - Received 1 unit pRBC this admission, stable.   FEN: Renal diet Prophylaxis: Heparin Code Status: Full  Dispo: discharge home today.  Lucious Groves, DO  12/27/2018, 1:21 PM

## 2019-01-01 DIAGNOSIS — Z5181 Encounter for therapeutic drug level monitoring: Secondary | ICD-10-CM | POA: Diagnosis not present

## 2019-01-01 DIAGNOSIS — N186 End stage renal disease: Secondary | ICD-10-CM | POA: Diagnosis not present

## 2019-01-01 DIAGNOSIS — E1129 Type 2 diabetes mellitus with other diabetic kidney complication: Secondary | ICD-10-CM | POA: Diagnosis not present

## 2019-01-01 NOTE — Progress Notes (Deleted)
Patient ID: [name] [name], female   DOB: 1981-05-25, 38 y.o.   MRN: [id number]   After hospitalization 4/17-4/24/20.  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  Initial chest x-ray showed bilateral pulmonary infiltrates.  Given that she is immunosuppressed there was a broad differential she was started on broad-spectrum IV antibiotics.  She was also tested for the novel coronavirus which was negative.  Infectious disease as well as pulmonology were consulted with her care.  We obtained several serum blood markers for signs of fungal disease however they were negative a QuantiFERON was also negative.  Her fevers abated on broad-spectrum antibiotics.  Her pulmonary infiltrates were markedly reduced with volume removal with hemodialysis.  Gradually her supplemental oxygen requirement was weaned and by the time of discharge she was doing very well on room air.  We did consider bronchoscopy for evaluation of her suspected pneumonia however this was initially deferred due to suspicion of coronavirus and by the time of discharge given her clinical improvement it was felt that the risk did not outweigh the benefits.  She has completed 6 days of IV antibiotics in the hospital given that she has ESRD upon dosing of her cefepime today this will cover through 7 days.  No further antibiotics were recommended at this time.     Pancreas transplant status (HCC)/ Immunosuppression ([place]) -She was  continued on her immunosuppression regimen while inpatient this was not adjusted.    Chest pain Associated with cough, EKG and troponins reassuring improving at time of discharge     Hyperkalemia Resolved with hemodialysis  Normocytic anemia - secondary to ESRD however appeared to have a drop to 6.8 was transfused 1uPRBC and post remained stable at ~9.  Aortic atherosclerosis - noted on CT.

## 2019-01-02 ENCOUNTER — Other Ambulatory Visit: Payer: Self-pay

## 2019-01-02 ENCOUNTER — Ambulatory Visit: Payer: Medicare Other | Attending: Family Medicine

## 2019-01-03 DIAGNOSIS — D509 Iron deficiency anemia, unspecified: Secondary | ICD-10-CM | POA: Diagnosis not present

## 2019-01-03 DIAGNOSIS — E1129 Type 2 diabetes mellitus with other diabetic kidney complication: Secondary | ICD-10-CM | POA: Diagnosis not present

## 2019-01-03 DIAGNOSIS — T8610 Unspecified complication of kidney transplant: Secondary | ICD-10-CM | POA: Diagnosis not present

## 2019-01-03 DIAGNOSIS — N189 Chronic kidney disease, unspecified: Secondary | ICD-10-CM | POA: Diagnosis not present

## 2019-01-03 DIAGNOSIS — Z992 Dependence on renal dialysis: Secondary | ICD-10-CM | POA: Diagnosis not present

## 2019-01-03 DIAGNOSIS — N186 End stage renal disease: Secondary | ICD-10-CM | POA: Diagnosis not present

## 2019-01-03 DIAGNOSIS — N2581 Secondary hyperparathyroidism of renal origin: Secondary | ICD-10-CM | POA: Diagnosis not present

## 2019-01-03 DIAGNOSIS — D631 Anemia in chronic kidney disease: Secondary | ICD-10-CM | POA: Diagnosis not present

## 2019-01-24 DIAGNOSIS — N186 End stage renal disease: Secondary | ICD-10-CM | POA: Diagnosis not present

## 2019-01-24 DIAGNOSIS — Z5181 Encounter for therapeutic drug level monitoring: Secondary | ICD-10-CM | POA: Diagnosis not present

## 2019-02-03 DIAGNOSIS — Z992 Dependence on renal dialysis: Secondary | ICD-10-CM | POA: Diagnosis not present

## 2019-02-03 DIAGNOSIS — N2581 Secondary hyperparathyroidism of renal origin: Secondary | ICD-10-CM | POA: Diagnosis not present

## 2019-02-03 DIAGNOSIS — N186 End stage renal disease: Secondary | ICD-10-CM | POA: Diagnosis not present

## 2019-02-03 DIAGNOSIS — E1129 Type 2 diabetes mellitus with other diabetic kidney complication: Secondary | ICD-10-CM | POA: Diagnosis not present

## 2019-02-03 DIAGNOSIS — T8610 Unspecified complication of kidney transplant: Secondary | ICD-10-CM | POA: Diagnosis not present

## 2019-02-03 DIAGNOSIS — D631 Anemia in chronic kidney disease: Secondary | ICD-10-CM | POA: Diagnosis not present

## 2019-02-03 DIAGNOSIS — D509 Iron deficiency anemia, unspecified: Secondary | ICD-10-CM | POA: Diagnosis not present

## 2019-02-19 DIAGNOSIS — Z5181 Encounter for therapeutic drug level monitoring: Secondary | ICD-10-CM | POA: Diagnosis not present

## 2019-02-19 DIAGNOSIS — N186 End stage renal disease: Secondary | ICD-10-CM | POA: Diagnosis not present

## 2019-03-05 DIAGNOSIS — D509 Iron deficiency anemia, unspecified: Secondary | ICD-10-CM | POA: Diagnosis not present

## 2019-03-05 DIAGNOSIS — T8610 Unspecified complication of kidney transplant: Secondary | ICD-10-CM | POA: Diagnosis not present

## 2019-03-05 DIAGNOSIS — Z992 Dependence on renal dialysis: Secondary | ICD-10-CM | POA: Diagnosis not present

## 2019-03-05 DIAGNOSIS — E1129 Type 2 diabetes mellitus with other diabetic kidney complication: Secondary | ICD-10-CM | POA: Diagnosis not present

## 2019-03-05 DIAGNOSIS — D631 Anemia in chronic kidney disease: Secondary | ICD-10-CM | POA: Diagnosis not present

## 2019-03-05 DIAGNOSIS — N186 End stage renal disease: Secondary | ICD-10-CM | POA: Diagnosis not present

## 2019-03-05 DIAGNOSIS — N2581 Secondary hyperparathyroidism of renal origin: Secondary | ICD-10-CM | POA: Diagnosis not present

## 2019-03-19 DIAGNOSIS — E1129 Type 2 diabetes mellitus with other diabetic kidney complication: Secondary | ICD-10-CM | POA: Diagnosis not present

## 2019-03-19 DIAGNOSIS — Z5181 Encounter for therapeutic drug level monitoring: Secondary | ICD-10-CM | POA: Diagnosis not present

## 2019-04-05 DIAGNOSIS — N2581 Secondary hyperparathyroidism of renal origin: Secondary | ICD-10-CM | POA: Diagnosis not present

## 2019-04-05 DIAGNOSIS — Z992 Dependence on renal dialysis: Secondary | ICD-10-CM | POA: Diagnosis not present

## 2019-04-05 DIAGNOSIS — T8610 Unspecified complication of kidney transplant: Secondary | ICD-10-CM | POA: Diagnosis not present

## 2019-04-05 DIAGNOSIS — N186 End stage renal disease: Secondary | ICD-10-CM | POA: Diagnosis not present

## 2019-04-05 DIAGNOSIS — E1129 Type 2 diabetes mellitus with other diabetic kidney complication: Secondary | ICD-10-CM | POA: Diagnosis not present

## 2019-04-05 DIAGNOSIS — D631 Anemia in chronic kidney disease: Secondary | ICD-10-CM | POA: Diagnosis not present

## 2019-04-05 DIAGNOSIS — D509 Iron deficiency anemia, unspecified: Secondary | ICD-10-CM | POA: Diagnosis not present

## 2019-04-23 DIAGNOSIS — R0602 Shortness of breath: Secondary | ICD-10-CM | POA: Insufficient documentation

## 2019-04-25 DIAGNOSIS — Z5181 Encounter for therapeutic drug level monitoring: Secondary | ICD-10-CM | POA: Diagnosis not present

## 2019-05-06 DIAGNOSIS — N186 End stage renal disease: Secondary | ICD-10-CM | POA: Diagnosis not present

## 2019-05-06 DIAGNOSIS — T8610 Unspecified complication of kidney transplant: Secondary | ICD-10-CM | POA: Diagnosis not present

## 2019-05-06 DIAGNOSIS — Z992 Dependence on renal dialysis: Secondary | ICD-10-CM | POA: Diagnosis not present

## 2019-05-07 DIAGNOSIS — Z992 Dependence on renal dialysis: Secondary | ICD-10-CM | POA: Diagnosis not present

## 2019-05-07 DIAGNOSIS — D509 Iron deficiency anemia, unspecified: Secondary | ICD-10-CM | POA: Diagnosis not present

## 2019-05-07 DIAGNOSIS — E1129 Type 2 diabetes mellitus with other diabetic kidney complication: Secondary | ICD-10-CM | POA: Diagnosis not present

## 2019-05-07 DIAGNOSIS — N186 End stage renal disease: Secondary | ICD-10-CM | POA: Diagnosis not present

## 2019-05-07 DIAGNOSIS — N2581 Secondary hyperparathyroidism of renal origin: Secondary | ICD-10-CM | POA: Diagnosis not present

## 2019-05-07 DIAGNOSIS — D631 Anemia in chronic kidney disease: Secondary | ICD-10-CM | POA: Diagnosis not present

## 2019-05-21 DIAGNOSIS — Z5181 Encounter for therapeutic drug level monitoring: Secondary | ICD-10-CM | POA: Diagnosis not present

## 2019-06-05 DIAGNOSIS — T8610 Unspecified complication of kidney transplant: Secondary | ICD-10-CM | POA: Diagnosis not present

## 2019-06-05 DIAGNOSIS — Z992 Dependence on renal dialysis: Secondary | ICD-10-CM | POA: Diagnosis not present

## 2019-06-05 DIAGNOSIS — N186 End stage renal disease: Secondary | ICD-10-CM | POA: Diagnosis not present

## 2019-06-06 DIAGNOSIS — Z992 Dependence on renal dialysis: Secondary | ICD-10-CM | POA: Diagnosis not present

## 2019-06-06 DIAGNOSIS — Z23 Encounter for immunization: Secondary | ICD-10-CM | POA: Diagnosis not present

## 2019-06-06 DIAGNOSIS — N186 End stage renal disease: Secondary | ICD-10-CM | POA: Diagnosis not present

## 2019-06-06 DIAGNOSIS — E1129 Type 2 diabetes mellitus with other diabetic kidney complication: Secondary | ICD-10-CM | POA: Diagnosis not present

## 2019-06-06 DIAGNOSIS — D509 Iron deficiency anemia, unspecified: Secondary | ICD-10-CM | POA: Diagnosis not present

## 2019-06-06 DIAGNOSIS — D631 Anemia in chronic kidney disease: Secondary | ICD-10-CM | POA: Diagnosis not present

## 2019-06-06 DIAGNOSIS — N2581 Secondary hyperparathyroidism of renal origin: Secondary | ICD-10-CM | POA: Diagnosis not present

## 2019-06-27 DIAGNOSIS — E1129 Type 2 diabetes mellitus with other diabetic kidney complication: Secondary | ICD-10-CM | POA: Diagnosis not present

## 2019-06-27 DIAGNOSIS — Z5181 Encounter for therapeutic drug level monitoring: Secondary | ICD-10-CM | POA: Diagnosis not present

## 2019-07-06 DIAGNOSIS — N186 End stage renal disease: Secondary | ICD-10-CM | POA: Diagnosis not present

## 2019-07-06 DIAGNOSIS — Z992 Dependence on renal dialysis: Secondary | ICD-10-CM | POA: Diagnosis not present

## 2019-07-06 DIAGNOSIS — T8610 Unspecified complication of kidney transplant: Secondary | ICD-10-CM | POA: Diagnosis not present

## 2019-07-07 DIAGNOSIS — D631 Anemia in chronic kidney disease: Secondary | ICD-10-CM | POA: Diagnosis not present

## 2019-07-07 DIAGNOSIS — N186 End stage renal disease: Secondary | ICD-10-CM | POA: Diagnosis not present

## 2019-07-07 DIAGNOSIS — E1129 Type 2 diabetes mellitus with other diabetic kidney complication: Secondary | ICD-10-CM | POA: Diagnosis not present

## 2019-07-07 DIAGNOSIS — Z992 Dependence on renal dialysis: Secondary | ICD-10-CM | POA: Diagnosis not present

## 2019-07-07 DIAGNOSIS — N2581 Secondary hyperparathyroidism of renal origin: Secondary | ICD-10-CM | POA: Diagnosis not present

## 2019-07-07 DIAGNOSIS — D509 Iron deficiency anemia, unspecified: Secondary | ICD-10-CM | POA: Diagnosis not present

## 2019-07-23 DIAGNOSIS — Z5181 Encounter for therapeutic drug level monitoring: Secondary | ICD-10-CM | POA: Diagnosis not present

## 2019-08-05 DIAGNOSIS — T8610 Unspecified complication of kidney transplant: Secondary | ICD-10-CM | POA: Diagnosis not present

## 2019-08-05 DIAGNOSIS — Z992 Dependence on renal dialysis: Secondary | ICD-10-CM | POA: Diagnosis not present

## 2019-08-05 DIAGNOSIS — N186 End stage renal disease: Secondary | ICD-10-CM | POA: Diagnosis not present

## 2019-08-08 DIAGNOSIS — D509 Iron deficiency anemia, unspecified: Secondary | ICD-10-CM | POA: Diagnosis not present

## 2019-08-08 DIAGNOSIS — D631 Anemia in chronic kidney disease: Secondary | ICD-10-CM | POA: Diagnosis not present

## 2019-08-08 DIAGNOSIS — E1129 Type 2 diabetes mellitus with other diabetic kidney complication: Secondary | ICD-10-CM | POA: Diagnosis not present

## 2019-08-08 DIAGNOSIS — N2581 Secondary hyperparathyroidism of renal origin: Secondary | ICD-10-CM | POA: Diagnosis not present

## 2019-08-08 DIAGNOSIS — N186 End stage renal disease: Secondary | ICD-10-CM | POA: Diagnosis not present

## 2019-08-08 DIAGNOSIS — Z992 Dependence on renal dialysis: Secondary | ICD-10-CM | POA: Diagnosis not present

## 2019-08-22 DIAGNOSIS — Z5181 Encounter for therapeutic drug level monitoring: Secondary | ICD-10-CM | POA: Diagnosis not present

## 2019-09-05 DIAGNOSIS — T8610 Unspecified complication of kidney transplant: Secondary | ICD-10-CM | POA: Diagnosis not present

## 2019-09-05 DIAGNOSIS — N186 End stage renal disease: Secondary | ICD-10-CM | POA: Diagnosis not present

## 2019-09-05 DIAGNOSIS — Z992 Dependence on renal dialysis: Secondary | ICD-10-CM | POA: Diagnosis not present

## 2019-09-06 DIAGNOSIS — Z992 Dependence on renal dialysis: Secondary | ICD-10-CM | POA: Diagnosis not present

## 2019-09-06 DIAGNOSIS — E1129 Type 2 diabetes mellitus with other diabetic kidney complication: Secondary | ICD-10-CM | POA: Diagnosis not present

## 2019-09-06 DIAGNOSIS — D631 Anemia in chronic kidney disease: Secondary | ICD-10-CM | POA: Diagnosis not present

## 2019-09-06 DIAGNOSIS — N2581 Secondary hyperparathyroidism of renal origin: Secondary | ICD-10-CM | POA: Diagnosis not present

## 2019-09-06 DIAGNOSIS — N186 End stage renal disease: Secondary | ICD-10-CM | POA: Diagnosis not present

## 2019-09-06 DIAGNOSIS — D509 Iron deficiency anemia, unspecified: Secondary | ICD-10-CM | POA: Diagnosis not present

## 2019-09-14 ENCOUNTER — Emergency Department (HOSPITAL_COMMUNITY): Payer: Medicare Other

## 2019-09-14 ENCOUNTER — Emergency Department (HOSPITAL_COMMUNITY)
Admission: EM | Admit: 2019-09-14 | Discharge: 2019-09-14 | Discharge status: Against Medical Advice | Payer: Medicare Other | Attending: Emergency Medicine | Admitting: Emergency Medicine

## 2019-09-14 ENCOUNTER — Encounter (HOSPITAL_COMMUNITY): Payer: Self-pay

## 2019-09-14 ENCOUNTER — Other Ambulatory Visit: Payer: Self-pay

## 2019-09-14 DIAGNOSIS — R1013 Epigastric pain: Secondary | ICD-10-CM | POA: Diagnosis not present

## 2019-09-14 DIAGNOSIS — Z992 Dependence on renal dialysis: Secondary | ICD-10-CM | POA: Insufficient documentation

## 2019-09-14 DIAGNOSIS — Z20822 Contact with and (suspected) exposure to covid-19: Secondary | ICD-10-CM | POA: Diagnosis not present

## 2019-09-14 DIAGNOSIS — Z940 Kidney transplant status: Secondary | ICD-10-CM | POA: Diagnosis not present

## 2019-09-14 DIAGNOSIS — Z7982 Long term (current) use of aspirin: Secondary | ICD-10-CM | POA: Diagnosis not present

## 2019-09-14 DIAGNOSIS — N186 End stage renal disease: Secondary | ICD-10-CM | POA: Diagnosis not present

## 2019-09-14 DIAGNOSIS — I120 Hypertensive chronic kidney disease with stage 5 chronic kidney disease or end stage renal disease: Secondary | ICD-10-CM | POA: Insufficient documentation

## 2019-09-14 DIAGNOSIS — R05 Cough: Secondary | ICD-10-CM | POA: Diagnosis not present

## 2019-09-14 DIAGNOSIS — E1022 Type 1 diabetes mellitus with diabetic chronic kidney disease: Secondary | ICD-10-CM | POA: Diagnosis not present

## 2019-09-14 DIAGNOSIS — R1084 Generalized abdominal pain: Secondary | ICD-10-CM | POA: Diagnosis not present

## 2019-09-14 DIAGNOSIS — Z5320 Procedure and treatment not carried out because of patient's decision for unspecified reasons: Secondary | ICD-10-CM | POA: Insufficient documentation

## 2019-09-14 DIAGNOSIS — I10 Essential (primary) hypertension: Secondary | ICD-10-CM | POA: Diagnosis not present

## 2019-09-14 DIAGNOSIS — Z79899 Other long term (current) drug therapy: Secondary | ICD-10-CM | POA: Diagnosis not present

## 2019-09-14 DIAGNOSIS — R0902 Hypoxemia: Secondary | ICD-10-CM | POA: Diagnosis not present

## 2019-09-14 DIAGNOSIS — R000 Tachycardia, unspecified: Secondary | ICD-10-CM | POA: Diagnosis not present

## 2019-09-14 DIAGNOSIS — R0602 Shortness of breath: Secondary | ICD-10-CM | POA: Diagnosis not present

## 2019-09-14 DIAGNOSIS — R52 Pain, unspecified: Secondary | ICD-10-CM | POA: Diagnosis not present

## 2019-09-14 LAB — COMPREHENSIVE METABOLIC PANEL
ALT: 17 U/L (ref 0–44)
AST: 17 U/L (ref 15–41)
Albumin: 3.5 g/dL (ref 3.5–5.0)
Alkaline Phosphatase: 67 U/L (ref 38–126)
Anion gap: 13 (ref 5–15)
BUN: 59 mg/dL — ABNORMAL HIGH (ref 6–20)
CO2: 21 mmol/L — ABNORMAL LOW (ref 22–32)
Calcium: 9.5 mg/dL (ref 8.9–10.3)
Chloride: 102 mmol/L (ref 98–111)
Creatinine, Ser: 10.6 mg/dL — ABNORMAL HIGH (ref 0.44–1.00)
GFR calc Af Amer: 5 mL/min — ABNORMAL LOW (ref >60)
GFR calc non Af Amer: 4 mL/min — ABNORMAL LOW (ref >60)
Glucose, Bld: 89 mg/dL (ref 70–99)
Potassium: 5.4 mmol/L — ABNORMAL HIGH (ref 3.5–5.1)
Sodium: 136 mmol/L (ref 135–145)
Total Bilirubin: 0.9 mg/dL (ref 0.3–1.2)
Total Protein: 7.3 g/dL (ref 6.5–8.1)

## 2019-09-14 LAB — CBC WITH DIFFERENTIAL/PLATELET
Abs Immature Granulocytes: 0.06 10*3/uL (ref 0.00–0.07)
Basophils Absolute: 0 10*3/uL (ref 0.0–0.1)
Basophils Relative: 0 %
Eosinophils Absolute: 0.3 10*3/uL (ref 0.0–0.5)
Eosinophils Relative: 3 %
HCT: 32.4 % — ABNORMAL LOW (ref 36.0–46.0)
Hemoglobin: 10.4 g/dL — ABNORMAL LOW (ref 12.0–15.0)
Immature Granulocytes: 1 %
Lymphocytes Relative: 9 %
Lymphs Abs: 0.9 10*3/uL (ref 0.7–4.0)
MCH: 31.4 pg (ref 26.0–34.0)
MCHC: 32.1 g/dL (ref 30.0–36.0)
MCV: 97.9 fL (ref 80.0–100.0)
Monocytes Absolute: 0.2 10*3/uL (ref 0.1–1.0)
Monocytes Relative: 3 %
Neutro Abs: 7.8 10*3/uL — ABNORMAL HIGH (ref 1.7–7.7)
Neutrophils Relative %: 84 %
Platelets: 135 10*3/uL — ABNORMAL LOW (ref 150–400)
RBC: 3.31 MIL/uL — ABNORMAL LOW (ref 3.87–5.11)
RDW: 13.2 % (ref 11.5–15.5)
WBC: 9.3 10*3/uL (ref 4.0–10.5)
nRBC: 0 % (ref 0.0–0.2)

## 2019-09-14 LAB — RESPIRATORY PANEL BY RT PCR (FLU A&B, COVID)
Influenza A by PCR: NEGATIVE
Influenza B by PCR: NEGATIVE
SARS Coronavirus 2 by RT PCR: NEGATIVE

## 2019-09-14 LAB — LIPASE, BLOOD: Lipase: 37 U/L (ref 11–51)

## 2019-09-14 LAB — LACTIC ACID, PLASMA: Lactic Acid, Venous: 1.1 mmol/L (ref 0.5–1.9)

## 2019-09-14 LAB — I-STAT BETA HCG BLOOD, ED (MC, WL, AP ONLY): I-stat hCG, quantitative: 15.6 m[IU]/mL — ABNORMAL HIGH (ref <5)

## 2019-09-14 IMAGING — DX DG CHEST 1V PORT
1 series · 1 of 1 positions shown · non-contrast
Comparison: [DATE]

CLINICAL DATA: Shortness of breath and cough

EXAM:
PORTABLE CHEST 1 VIEW

[chest]
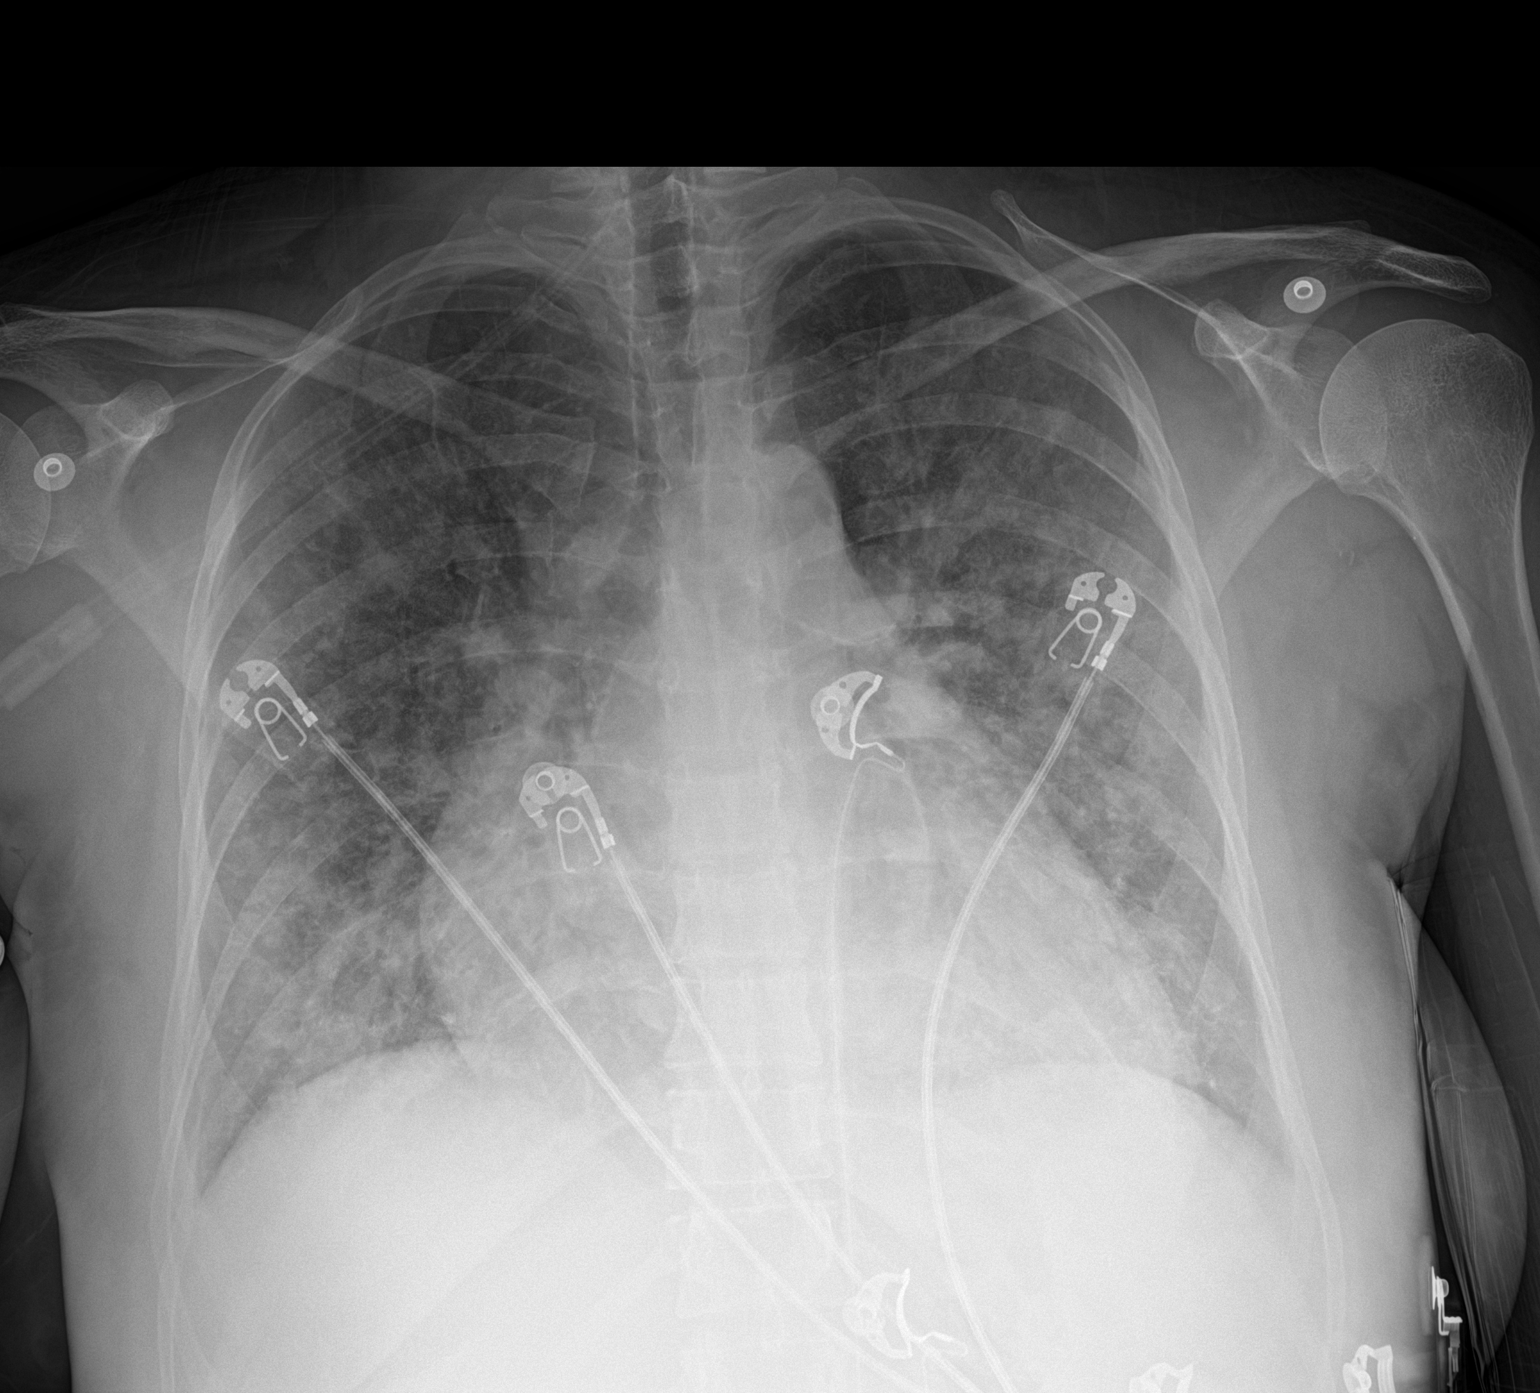

[1 of 1 positions shown; findings below may reference images not displayed]

FINDINGS: There is widespread airspace opacity bilaterally. There is no frank
consolidation or volume loss. There is cardiomegaly with pulmonary
vascularity within normal limits. No adenopathy. No bone lesions
IMPRESSION: Widespread airspace opacity bilaterally. Suspect multifocal
pneumonia, although a degree of pulmonary edema could present in
this manner. Both pneumonia and edema may present concurrently.

Cardiomegaly with pulmonary vascularity normal.  No adenopathy.

## 2019-09-14 MED ORDER — ONDANSETRON HCL 4 MG/2ML IJ SOLN
4.0000 mg | Freq: Once | INTRAMUSCULAR | Status: DC
  Filled 2019-09-14: qty 2

## 2019-09-14 MED ORDER — HYDROMORPHONE HCL 1 MG/ML IJ SOLN
1.0000 mg | Freq: Once | INTRAMUSCULAR | Status: AC
  Administered 2019-09-14: 1 mg via INTRAVENOUS
  Filled 2019-09-14: qty 1

## 2019-09-14 MED ORDER — KETAMINE HCL 50 MG/5ML IJ SOSY
0.3000 mg/kg | PREFILLED_SYRINGE | Freq: Once | INTRAMUSCULAR | Status: DC
  Filled 2019-09-14: qty 5

## 2019-09-14 MED ORDER — ONDANSETRON HCL 4 MG/2ML IJ SOLN
4.0000 mg | Freq: Once | INTRAMUSCULAR | Status: AC
  Administered 2019-09-14: 4 mg via INTRAVENOUS

## 2019-09-14 MED ORDER — METOCLOPRAMIDE HCL 5 MG/ML IJ SOLN
10.0000 mg | Freq: Once | INTRAMUSCULAR | Status: AC
  Administered 2019-09-14: 10 mg via INTRAVENOUS
  Filled 2019-09-14: qty 2

## 2019-09-14 MED ORDER — SODIUM CHLORIDE 0.9 % IV SOLN
1.0000 g | Freq: Once | INTRAVENOUS | Status: DC
  Filled 2019-09-14: qty 1

## 2019-09-14 MED ORDER — VANCOMYCIN HCL 1250 MG/250ML IV SOLN
1250.0000 mg | Freq: Once | INTRAVENOUS | Status: DC
  Filled 2019-09-14: qty 250

## 2019-09-14 NOTE — ED Notes (Signed)
Pt requesting this RN to push Dilaudid faster and to quickly flush behind with 2 saline flushes so she can get more of an affect from it.

## 2019-09-14 NOTE — ED Triage Notes (Signed)
Per [facility] pt from home, pt c/o generalized abd pain, increase pain to the left flank area. Pt refused Zofran en route stated she wanted to wait until she got to the hospital to get something different. Pt reports past medical hx of pancreas and kidney transplant in 2015. Started dialysis 2019, dialsis M/W/F but did not go yesterday, last treatment was Wednesday. 3 episodes of vomiting and 1 BM this am. Pt also reports foul smelling urine denies dysuria.   192/98 104 92% RA 97% on 2L [place]  CBG 116

## 2019-09-14 NOTE — Discharge Instructions (Addendum)
Please return if worsening.

## 2019-09-14 NOTE — ED Provider Notes (Addendum)
[facility] EMERGENCY DEPARTMENT Provider Note   CSN: [id number] Arrival date & time: 09/14/19  0946  History Chief Complaint  Patient presents with  . Abdominal Pain    [name] [name] is a 39 y.o. female with history of Type 1 DM, ESRD on dialysis M/W/F, gastroparesis, s/p kidney-pancreas transplant (with rejection of kidney) who presents with abdominal pain. She states it started acutely this morning at 6AM. She reports severe, 10/10 epigastric abdominal pain. She reports associated multiple episodes of N/V. She also had multiple BMs this morning but states that it was not diarrhea. She had a temp of 100.5 for EMS. She also reports chills, dry cough, left sided chest pain, and SOB all which started this morning. She states this is different from her chronic abdominal pain which is usually in the LLQ. She has had malodorous urine but no dysuria or frequency. She goes to dialysis M, W, F but missed Friday due to waking up late.    HPI     Past Medical History:  Diagnosis Date  . Anxiety   . Gastroparesis   . Narcotic abuse ([place])   . Non compliance with medical treatment   . Renal disorder   . Vision loss     Patient Active Problem List   Diagnosis Date Noted  . Aortic atherosclerosis ([place]) 12/27/2018  . Acute respiratory failure with hypoxia ([place]) 12/22/2018  . Hyperkalemia   . Bilateral pneumonia 12/21/2018  . Pancreas transplant status ([place]) 12/21/2018  . Immunosuppression ([place]) 12/21/2018  . Chest pain 12/21/2018  . CAP (community acquired pneumonia) 12/20/2018  . End stage renal disease ([place]) 02/06/2012  . Pleural effusion, bilateral 11/16/2011  . Anemia in chronic kidney disease (CKD) 11/16/2011  . Pericardial effusion 11/15/2011  . Fluid overload 11/15/2011  . Abdominal pain, epigastric 11/15/2011  . CKD (chronic kidney disease) stage 5, GFR less than 15 ml/min (HCC) 11/03/2011  . Hypertension 10/27/2011  . Renal failure, chronic 10/27/2011    . MEDIAL EPICONDYLITIS, LEFT 12/17/2007  . ABSCESS, AXILLA, LEFT 12/03/2007  . DIABETES MELLITUS, TYPE I, UNCONTROLLED 11/05/2007    Past Surgical History:  Procedure Laterality Date  . AV FISTULA PLACEMENT  11/17/2011   Procedure: ARTERIOVENOUS (AV) FISTULA CREATION;  Surgeon: Rosetta Posner, MD;  Location: [facility];  Service: Vascular;  Laterality: Right;  . EYE SURGERY    . KIDNEY TRANSPLANT    . NEPHRECTOMY TRANSPLANTED ORGAN    . pancrease transplant    . REFRACTIVE SURGERY       OB History   No obstetric history on file.     Family History  Problem Relation Age of Onset  . Hypertension Father     Social History   Tobacco Use  . Smoking status: Never Smoker  . Smokeless tobacco: Never Used  . Tobacco comment: Hooka  Substance Use Topics  . Alcohol use: No  . Drug use: No    Home Medications Prior to Admission medications   Medication Sig Start Date End Date Taking? Authorizing Provider  acetaminophen (TYLENOL) 500 MG tablet Take 1,000 mg by mouth every 6 (six) hours as needed for mild pain.    [provider]  amLODipine (NORVASC) 10 MG tablet Take 10 mg by mouth daily. 09/19/18   [provider]  aspirin EC 81 MG tablet Take 81 mg by mouth every morning.    [provider]  cycloSPORINE modified (NEORAL) 100 MG capsule Take 200 mg by mouth 2 (two) times daily.  [provider]  mycophenolate (MYFORTIC) 180 MG EC tablet Take 540 mg by mouth 2 (two) times daily.    [provider]  predniSONE (DELTASONE) 5 MG tablet Take 5 mg by mouth daily.  06/24/15   [provider]  promethazine (PHENERGAN) 25 MG suppository Place 1 suppository (25 mg total) rectally every 6 (six) hours as needed for nausea or vomiting. Patient not taking: Reported on 09/07/2014 02/22/14   Evelina Bucy, MD    Allergies    Fish-derived products and Shrimp [shellfish allergy]  Review of Systems   Review of Systems  Constitutional: Positive  for fever. Negative for chills.  Respiratory: Positive for cough and shortness of breath.   Cardiovascular: Positive for chest pain.  Gastrointestinal: Positive for abdominal pain, nausea and vomiting. Negative for diarrhea.  Genitourinary: Negative for dysuria and flank pain.  Allergic/Immunologic: Positive for immunocompromised state.  Psychiatric/Behavioral: The patient is nervous/anxious.   All other systems reviewed and are negative.   Physical Exam Updated Vital Signs BP (!) 204/90 (BP Location: Left Arm)   Pulse (!) 110   Temp 99.9 F (37.7 C) (Oral)   Resp (!) 26   Ht 5\' 9"  (1.753 m)   SpO2 93%   BMI 19.37 kg/m   Physical Exam Vitals and nursing note reviewed.  Constitutional:      General: She is in acute distress (crying, anxious).     Appearance: She is well-developed. She is ill-appearing (chronically ill).  HENT:     Head: Normocephalic and atraumatic.  Eyes:     General: No scleral icterus.       Right eye: No discharge.        Left eye: No discharge.     Conjunctiva/sclera: Conjunctivae normal.     Pupils: Pupils are equal, round, and reactive to light.  Cardiovascular:     Rate and Rhythm: Regular rhythm. Tachycardia present.     Comments: Fistula in RUE with palpable thrill Pulmonary:     Effort: Pulmonary effort is normal. No respiratory distress.     Breath sounds: Normal breath sounds.  Abdominal:     General: Abdomen is protuberant. There is no distension.     Tenderness: There is abdominal tenderness (epigastric).     Comments: Large midline abdominal scar  Musculoskeletal:     Cervical back: Normal range of motion.  Skin:    General: Skin is warm and dry.  Neurological:     Mental Status: She is alert and oriented to person, place, and time.  Psychiatric:        Mood and Affect: Mood is anxious.        Behavior: Behavior normal.     ED Results / Procedures / Treatments   Labs (all labs ordered are listed, but only abnormal results are  displayed) Labs Reviewed  CBC WITH DIFFERENTIAL/PLATELET - Abnormal; Notable for the following components:      Result Value   RBC 3.31 (*)    Hemoglobin 10.4 (*)    HCT 32.4 (*)    Platelets 135 (*)    Neutro Abs 7.8 (*)    All other components within normal limits  RESPIRATORY PANEL BY RT PCR (FLU A&B, COVID)  CULTURE, BLOOD (ROUTINE X 2)  CULTURE, BLOOD (ROUTINE X 2)  LACTIC ACID, PLASMA  COMPREHENSIVE METABOLIC PANEL  LIPASE, BLOOD  URINALYSIS, ROUTINE W REFLEX MICROSCOPIC  LACTIC ACID, PLASMA  I-STAT BETA HCG BLOOD, ED ([facility], [facility], [facility] ONLY)    EKG EKG Interpretation  Date/Time:  Sunday September 14 2019 10:00:18 EST Ventricular Rate:  110 PR Interval:    QRS Duration: 79 QT Interval:  332 QTC Calculation: 450 R Axis:   64 Text Interpretation: Sinus tachycardia When compared to prior, faster rate but improved t waves. No STEMI Confirmed by Antony Blackbird [phone]) on 09/14/2019 12:03:59 PM   Radiology DG Chest Port 1 View  Result Date: 09/14/2019 CLINICAL DATA:  Shortness of breath and cough EXAM: PORTABLE CHEST 1 VIEW COMPARISON:  December 23, 2018 FINDINGS: There is widespread airspace opacity bilaterally. There is no frank consolidation or volume loss. There is cardiomegaly with pulmonary vascularity within normal limits. No adenopathy. No bone lesions IMPRESSION: Widespread airspace opacity bilaterally. Suspect multifocal pneumonia, although a degree of pulmonary edema could present in this manner. Both pneumonia and edema may present concurrently. Cardiomegaly with pulmonary vascularity normal.  No adenopathy. Electronically Signed   By: Lowella Grip III M.D.   On: 09/14/2019 11:22    Procedures Procedures (including critical care time)  Medications Ordered in ED Medications  ketamine 50 mg in normal saline 5 mL (10 mg/mL) syringe (has no administration in time range)  ceFEPIme (MAXIPIME) 1 g in sodium chloride 0.9 % 100 mL IVPB (has no administration in time range)    vancomycin (VANCOREADY) IVPB 1250 mg/250 mL (has no administration in time range)  HYDROmorphone (DILAUDID) injection 1 mg (1 mg Intravenous Given 09/14/19 1111)  metoCLOPramide (REGLAN) injection 10 mg (10 mg Intravenous Given 09/14/19 1244)  ondansetron (ZOFRAN) injection 4 mg (4 mg Intravenous Given 09/14/19 1125)    ED Course  I have reviewed the triage vital signs and the nursing notes.  Pertinent labs & imaging results that were available during my care of the patient were reviewed by me and considered in my medical decision making (see chart for details).  39 year old female presents with epigastric abdominal pain and N/V. She has had similar presentations in the past. Ddx: gastroparesis, acute infection, gastroenteritis, bowel obstruction. On exam she is distressed, moaning and crying due to pain. Heart rate is fast and regular. Lungs are CTA. Abdomen is soft but tender in the epigastric region. Fistula has palpable thrill. Since differential is broad at this time will obtain labs, CT A/P, EKG, CXR, COVID test. She missed dialysis and EKG shows sinus tachycardia without any significant changes.   RN came to me and states patient wants to leave. I discussed with the patient that she has multiple vital signs abnormalities and appears volume overloaded on her CXR vs pneumonia. She states that she understands this but still wants to leave. She feels restless and can't stay. She did receive Reglan so may be akathisia reaction (I will add this to her allergy list). She cannot give me a clear reason why she won't stay. She was offered some anxiety medicine but declines. I asked to see if I could speak with family and she states she has no one (although I believe she is married). It was discussed that she could have a life threatening problem today which could result in disability or death and she states she understands and doesn't care and still wants to go home. She has a clear mental status and has  capacity to make her own decisions. She was advised she can return at any time for a re-evaluation. Labs that have returned show a normal WBC, normal lactate, and negative COVID.  MDM Rules/Calculators/A&P  Final Clinical Impression(s) / ED Diagnoses Final diagnoses:  Epigastric pain  ESRD (end stage renal disease) [facility])    Rx / DC Orders ED Discharge Orders    None       Recardo Evangelist, PA-C 09/14/19 1306    T[place], Gwenyth Allegra, MD 09/14/19 1538    Recardo Evangelist, PA-C 09/16/19 [place], Gwenyth Allegra, MD 09/16/19 2352

## 2019-09-21 LAB — CULTURE, BLOOD (ROUTINE X 2)
Culture: NO GROWTH
Special Requests: ADEQUATE

## 2019-09-24 DIAGNOSIS — Z5181 Encounter for therapeutic drug level monitoring: Secondary | ICD-10-CM | POA: Diagnosis not present

## 2019-09-24 DIAGNOSIS — E1129 Type 2 diabetes mellitus with other diabetic kidney complication: Secondary | ICD-10-CM | POA: Diagnosis not present

## 2019-10-06 DIAGNOSIS — D631 Anemia in chronic kidney disease: Secondary | ICD-10-CM | POA: Diagnosis not present

## 2019-10-06 DIAGNOSIS — T8610 Unspecified complication of kidney transplant: Secondary | ICD-10-CM | POA: Diagnosis not present

## 2019-10-06 DIAGNOSIS — N2581 Secondary hyperparathyroidism of renal origin: Secondary | ICD-10-CM | POA: Diagnosis not present

## 2019-10-06 DIAGNOSIS — E1129 Type 2 diabetes mellitus with other diabetic kidney complication: Secondary | ICD-10-CM | POA: Diagnosis not present

## 2019-10-06 DIAGNOSIS — Z992 Dependence on renal dialysis: Secondary | ICD-10-CM | POA: Diagnosis not present

## 2019-10-06 DIAGNOSIS — N186 End stage renal disease: Secondary | ICD-10-CM | POA: Diagnosis not present

## 2019-10-06 DIAGNOSIS — D509 Iron deficiency anemia, unspecified: Secondary | ICD-10-CM | POA: Diagnosis not present

## 2019-10-22 DIAGNOSIS — Z5181 Encounter for therapeutic drug level monitoring: Secondary | ICD-10-CM | POA: Diagnosis not present

## 2019-11-03 DIAGNOSIS — Z23 Encounter for immunization: Secondary | ICD-10-CM | POA: Diagnosis not present

## 2019-11-03 DIAGNOSIS — D509 Iron deficiency anemia, unspecified: Secondary | ICD-10-CM | POA: Diagnosis not present

## 2019-11-03 DIAGNOSIS — N186 End stage renal disease: Secondary | ICD-10-CM | POA: Diagnosis not present

## 2019-11-03 DIAGNOSIS — D631 Anemia in chronic kidney disease: Secondary | ICD-10-CM | POA: Diagnosis not present

## 2019-11-03 DIAGNOSIS — T8610 Unspecified complication of kidney transplant: Secondary | ICD-10-CM | POA: Diagnosis not present

## 2019-11-03 DIAGNOSIS — Z992 Dependence on renal dialysis: Secondary | ICD-10-CM | POA: Diagnosis not present

## 2019-11-03 DIAGNOSIS — N2581 Secondary hyperparathyroidism of renal origin: Secondary | ICD-10-CM | POA: Diagnosis not present

## 2019-11-11 DIAGNOSIS — E103591 Type 1 diabetes mellitus with proliferative diabetic retinopathy without macular edema, right eye: Secondary | ICD-10-CM | POA: Diagnosis not present

## 2019-11-11 DIAGNOSIS — E103512 Type 1 diabetes mellitus with proliferative diabetic retinopathy with macular edema, left eye: Secondary | ICD-10-CM | POA: Diagnosis not present

## 2019-11-21 DIAGNOSIS — I10 Essential (primary) hypertension: Secondary | ICD-10-CM | POA: Diagnosis not present

## 2019-11-21 DIAGNOSIS — M79672 Pain in left foot: Secondary | ICD-10-CM | POA: Diagnosis not present

## 2019-11-25 DIAGNOSIS — E103512 Type 1 diabetes mellitus with proliferative diabetic retinopathy with macular edema, left eye: Secondary | ICD-10-CM | POA: Diagnosis not present

## 2019-11-25 DIAGNOSIS — E103591 Type 1 diabetes mellitus with proliferative diabetic retinopathy without macular edema, right eye: Secondary | ICD-10-CM | POA: Diagnosis not present

## 2019-12-04 DIAGNOSIS — Z992 Dependence on renal dialysis: Secondary | ICD-10-CM | POA: Diagnosis not present

## 2019-12-04 DIAGNOSIS — T8610 Unspecified complication of kidney transplant: Secondary | ICD-10-CM | POA: Diagnosis not present

## 2019-12-04 DIAGNOSIS — N186 End stage renal disease: Secondary | ICD-10-CM | POA: Diagnosis not present

## 2019-12-05 DIAGNOSIS — D509 Iron deficiency anemia, unspecified: Secondary | ICD-10-CM | POA: Diagnosis not present

## 2019-12-05 DIAGNOSIS — E1129 Type 2 diabetes mellitus with other diabetic kidney complication: Secondary | ICD-10-CM | POA: Diagnosis not present

## 2019-12-05 DIAGNOSIS — N2581 Secondary hyperparathyroidism of renal origin: Secondary | ICD-10-CM | POA: Diagnosis not present

## 2019-12-05 DIAGNOSIS — N186 End stage renal disease: Secondary | ICD-10-CM | POA: Diagnosis not present

## 2019-12-05 DIAGNOSIS — D631 Anemia in chronic kidney disease: Secondary | ICD-10-CM | POA: Diagnosis not present

## 2019-12-05 DIAGNOSIS — Z992 Dependence on renal dialysis: Secondary | ICD-10-CM | POA: Diagnosis not present

## 2019-12-12 DIAGNOSIS — M109 Gout, unspecified: Secondary | ICD-10-CM | POA: Diagnosis not present

## 2019-12-26 DIAGNOSIS — E1129 Type 2 diabetes mellitus with other diabetic kidney complication: Secondary | ICD-10-CM | POA: Diagnosis not present

## 2019-12-26 DIAGNOSIS — Z5181 Encounter for therapeutic drug level monitoring: Secondary | ICD-10-CM | POA: Diagnosis not present

## 2020-01-03 DIAGNOSIS — Z992 Dependence on renal dialysis: Secondary | ICD-10-CM | POA: Diagnosis not present

## 2020-01-03 DIAGNOSIS — N186 End stage renal disease: Secondary | ICD-10-CM | POA: Diagnosis not present

## 2020-01-03 DIAGNOSIS — T8610 Unspecified complication of kidney transplant: Secondary | ICD-10-CM | POA: Diagnosis not present

## 2020-01-06 DIAGNOSIS — J90 Pleural effusion, not elsewhere classified: Secondary | ICD-10-CM | POA: Diagnosis not present

## 2020-01-06 DIAGNOSIS — E1122 Type 2 diabetes mellitus with diabetic chronic kidney disease: Secondary | ICD-10-CM | POA: Diagnosis not present

## 2020-01-06 DIAGNOSIS — R9431 Abnormal electrocardiogram [ECG] [EKG]: Secondary | ICD-10-CM | POA: Diagnosis not present

## 2020-01-06 DIAGNOSIS — R1030 Lower abdominal pain, unspecified: Secondary | ICD-10-CM | POA: Diagnosis not present

## 2020-01-06 DIAGNOSIS — R0902 Hypoxemia: Secondary | ICD-10-CM | POA: Diagnosis not present

## 2020-01-06 DIAGNOSIS — Z940 Kidney transplant status: Secondary | ICD-10-CM | POA: Diagnosis not present

## 2020-01-06 DIAGNOSIS — I313 Pericardial effusion (noninflammatory): Secondary | ICD-10-CM | POA: Diagnosis not present

## 2020-01-06 DIAGNOSIS — E875 Hyperkalemia: Secondary | ICD-10-CM | POA: Diagnosis not present

## 2020-01-06 DIAGNOSIS — J810 Acute pulmonary edema: Secondary | ICD-10-CM | POA: Diagnosis not present

## 2020-01-06 DIAGNOSIS — J9600 Acute respiratory failure, unspecified whether with hypoxia or hypercapnia: Secondary | ICD-10-CM | POA: Diagnosis not present

## 2020-01-06 DIAGNOSIS — Z7952 Long term (current) use of systemic steroids: Secondary | ICD-10-CM | POA: Diagnosis not present

## 2020-01-06 DIAGNOSIS — T8612 Kidney transplant failure: Secondary | ICD-10-CM | POA: Diagnosis not present

## 2020-01-06 DIAGNOSIS — E8770 Fluid overload, unspecified: Secondary | ICD-10-CM | POA: Diagnosis not present

## 2020-01-06 DIAGNOSIS — R1032 Left lower quadrant pain: Secondary | ICD-10-CM | POA: Diagnosis not present

## 2020-01-06 DIAGNOSIS — Z992 Dependence on renal dialysis: Secondary | ICD-10-CM | POA: Diagnosis not present

## 2020-01-06 DIAGNOSIS — I340 Nonrheumatic mitral (valve) insufficiency: Secondary | ICD-10-CM | POA: Diagnosis not present

## 2020-01-06 DIAGNOSIS — Z794 Long term (current) use of insulin: Secondary | ICD-10-CM | POA: Diagnosis not present

## 2020-01-06 DIAGNOSIS — I272 Pulmonary hypertension, unspecified: Secondary | ICD-10-CM | POA: Diagnosis not present

## 2020-01-06 DIAGNOSIS — D631 Anemia in chronic kidney disease: Secondary | ICD-10-CM | POA: Diagnosis not present

## 2020-01-06 DIAGNOSIS — F17200 Nicotine dependence, unspecified, uncomplicated: Secondary | ICD-10-CM | POA: Diagnosis not present

## 2020-01-06 DIAGNOSIS — N186 End stage renal disease: Secondary | ICD-10-CM | POA: Diagnosis not present

## 2020-01-06 DIAGNOSIS — N12 Tubulo-interstitial nephritis, not specified as acute or chronic: Secondary | ICD-10-CM | POA: Diagnosis not present

## 2020-01-06 DIAGNOSIS — Z9483 Pancreas transplant status: Secondary | ICD-10-CM | POA: Diagnosis not present

## 2020-01-06 DIAGNOSIS — R1084 Generalized abdominal pain: Secondary | ICD-10-CM | POA: Diagnosis not present

## 2020-01-06 DIAGNOSIS — Z7982 Long term (current) use of aspirin: Secondary | ICD-10-CM | POA: Diagnosis not present

## 2020-01-06 DIAGNOSIS — Z79899 Other long term (current) drug therapy: Secondary | ICD-10-CM | POA: Diagnosis not present

## 2020-01-06 DIAGNOSIS — I517 Cardiomegaly: Secondary | ICD-10-CM | POA: Diagnosis not present

## 2020-01-06 DIAGNOSIS — R109 Unspecified abdominal pain: Secondary | ICD-10-CM | POA: Diagnosis not present

## 2020-01-06 DIAGNOSIS — Z20822 Contact with and (suspected) exposure to covid-19: Secondary | ICD-10-CM | POA: Diagnosis not present

## 2020-01-06 DIAGNOSIS — I120 Hypertensive chronic kidney disease with stage 5 chronic kidney disease or end stage renal disease: Secondary | ICD-10-CM | POA: Diagnosis not present

## 2020-01-06 DIAGNOSIS — R52 Pain, unspecified: Secondary | ICD-10-CM | POA: Diagnosis not present

## 2020-01-06 DIAGNOSIS — R000 Tachycardia, unspecified: Secondary | ICD-10-CM | POA: Diagnosis not present

## 2020-01-06 DIAGNOSIS — J9601 Acute respiratory failure with hypoxia: Secondary | ICD-10-CM | POA: Diagnosis not present

## 2020-01-06 DIAGNOSIS — R0602 Shortness of breath: Secondary | ICD-10-CM | POA: Diagnosis not present

## 2020-01-06 DIAGNOSIS — R531 Weakness: Secondary | ICD-10-CM | POA: Diagnosis not present

## 2020-01-06 DIAGNOSIS — E1143 Type 2 diabetes mellitus with diabetic autonomic (poly)neuropathy: Secondary | ICD-10-CM | POA: Diagnosis not present

## 2020-01-09 DIAGNOSIS — N186 End stage renal disease: Secondary | ICD-10-CM | POA: Diagnosis not present

## 2020-01-09 DIAGNOSIS — N2581 Secondary hyperparathyroidism of renal origin: Secondary | ICD-10-CM | POA: Diagnosis not present

## 2020-01-09 DIAGNOSIS — Z992 Dependence on renal dialysis: Secondary | ICD-10-CM | POA: Diagnosis not present

## 2020-01-09 DIAGNOSIS — D509 Iron deficiency anemia, unspecified: Secondary | ICD-10-CM | POA: Diagnosis not present

## 2020-01-09 DIAGNOSIS — D631 Anemia in chronic kidney disease: Secondary | ICD-10-CM | POA: Diagnosis not present

## 2020-02-03 DIAGNOSIS — N186 End stage renal disease: Secondary | ICD-10-CM | POA: Diagnosis not present

## 2020-02-03 DIAGNOSIS — T8610 Unspecified complication of kidney transplant: Secondary | ICD-10-CM | POA: Diagnosis not present

## 2020-02-03 DIAGNOSIS — Z992 Dependence on renal dialysis: Secondary | ICD-10-CM | POA: Diagnosis not present

## 2020-02-04 DIAGNOSIS — D509 Iron deficiency anemia, unspecified: Secondary | ICD-10-CM | POA: Diagnosis not present

## 2020-02-04 DIAGNOSIS — N2581 Secondary hyperparathyroidism of renal origin: Secondary | ICD-10-CM | POA: Diagnosis not present

## 2020-02-04 DIAGNOSIS — N186 End stage renal disease: Secondary | ICD-10-CM | POA: Diagnosis not present

## 2020-02-04 DIAGNOSIS — Z992 Dependence on renal dialysis: Secondary | ICD-10-CM | POA: Diagnosis not present

## 2020-02-04 DIAGNOSIS — D631 Anemia in chronic kidney disease: Secondary | ICD-10-CM | POA: Diagnosis not present

## 2020-02-25 DIAGNOSIS — Z5181 Encounter for therapeutic drug level monitoring: Secondary | ICD-10-CM | POA: Diagnosis not present

## 2020-02-27 ENCOUNTER — Other Ambulatory Visit: Payer: Self-pay

## 2020-02-27 ENCOUNTER — Encounter (HOSPITAL_COMMUNITY): Payer: Self-pay | Admitting: *Deleted

## 2020-02-27 ENCOUNTER — Emergency Department (HOSPITAL_COMMUNITY): Payer: Medicare Other

## 2020-02-27 ENCOUNTER — Observation Stay (HOSPITAL_COMMUNITY)
Admission: EM | Admit: 2020-02-27 | Discharge: 2020-02-28 | Disposition: A | Discharge status: Home / Self Care | Payer: Medicare Other | Attending: Internal Medicine | Admitting: Internal Medicine

## 2020-02-27 DIAGNOSIS — Z992 Dependence on renal dialysis: Secondary | ICD-10-CM | POA: Diagnosis not present

## 2020-02-27 DIAGNOSIS — N2581 Secondary hyperparathyroidism of renal origin: Secondary | ICD-10-CM | POA: Diagnosis not present

## 2020-02-27 DIAGNOSIS — R05 Cough: Secondary | ICD-10-CM | POA: Insufficient documentation

## 2020-02-27 DIAGNOSIS — R0602 Shortness of breath: Secondary | ICD-10-CM | POA: Diagnosis not present

## 2020-02-27 DIAGNOSIS — Z7982 Long term (current) use of aspirin: Secondary | ICD-10-CM | POA: Diagnosis not present

## 2020-02-27 DIAGNOSIS — R1013 Epigastric pain: Secondary | ICD-10-CM | POA: Diagnosis not present

## 2020-02-27 DIAGNOSIS — D649 Anemia, unspecified: Secondary | ICD-10-CM | POA: Diagnosis not present

## 2020-02-27 DIAGNOSIS — F419 Anxiety disorder, unspecified: Secondary | ICD-10-CM | POA: Insufficient documentation

## 2020-02-27 DIAGNOSIS — Z905 Acquired absence of kidney: Secondary | ICD-10-CM | POA: Diagnosis not present

## 2020-02-27 DIAGNOSIS — F17290 Nicotine dependence, other tobacco product, uncomplicated: Secondary | ICD-10-CM | POA: Insufficient documentation

## 2020-02-27 DIAGNOSIS — I959 Hypotension, unspecified: Secondary | ICD-10-CM | POA: Diagnosis not present

## 2020-02-27 DIAGNOSIS — R1084 Generalized abdominal pain: Secondary | ICD-10-CM | POA: Diagnosis not present

## 2020-02-27 DIAGNOSIS — Z9885 Transplanted organ removal status: Secondary | ICD-10-CM | POA: Insufficient documentation

## 2020-02-27 DIAGNOSIS — E1043 Type 1 diabetes mellitus with diabetic autonomic (poly)neuropathy: Secondary | ICD-10-CM | POA: Diagnosis not present

## 2020-02-27 DIAGNOSIS — R110 Nausea: Secondary | ICD-10-CM | POA: Diagnosis not present

## 2020-02-27 DIAGNOSIS — Z79899 Other long term (current) drug therapy: Secondary | ICD-10-CM | POA: Insufficient documentation

## 2020-02-27 DIAGNOSIS — I700 Atherosclerosis of aorta: Secondary | ICD-10-CM | POA: Diagnosis not present

## 2020-02-27 DIAGNOSIS — Z7952 Long term (current) use of systemic steroids: Secondary | ICD-10-CM | POA: Insufficient documentation

## 2020-02-27 DIAGNOSIS — R52 Pain, unspecified: Secondary | ICD-10-CM | POA: Diagnosis not present

## 2020-02-27 DIAGNOSIS — K3184 Gastroparesis: Secondary | ICD-10-CM | POA: Insufficient documentation

## 2020-02-27 DIAGNOSIS — M6281 Muscle weakness (generalized): Secondary | ICD-10-CM | POA: Diagnosis not present

## 2020-02-27 DIAGNOSIS — Z8249 Family history of ischemic heart disease and other diseases of the circulatory system: Secondary | ICD-10-CM | POA: Insufficient documentation

## 2020-02-27 DIAGNOSIS — R55 Syncope and collapse: Secondary | ICD-10-CM | POA: Insufficient documentation

## 2020-02-27 DIAGNOSIS — Z20822 Contact with and (suspected) exposure to covid-19: Secondary | ICD-10-CM | POA: Diagnosis not present

## 2020-02-27 DIAGNOSIS — E1022 Type 1 diabetes mellitus with diabetic chronic kidney disease: Secondary | ICD-10-CM | POA: Insufficient documentation

## 2020-02-27 DIAGNOSIS — I120 Hypertensive chronic kidney disease with stage 5 chronic kidney disease or end stage renal disease: Secondary | ICD-10-CM | POA: Insufficient documentation

## 2020-02-27 DIAGNOSIS — Z91013 Allergy to seafood: Secondary | ICD-10-CM | POA: Insufficient documentation

## 2020-02-27 DIAGNOSIS — Z9483 Pancreas transplant status: Secondary | ICD-10-CM | POA: Insufficient documentation

## 2020-02-27 DIAGNOSIS — N186 End stage renal disease: Secondary | ICD-10-CM | POA: Insufficient documentation

## 2020-02-27 DIAGNOSIS — Z940 Kidney transplant status: Secondary | ICD-10-CM | POA: Insufficient documentation

## 2020-02-27 DIAGNOSIS — D631 Anemia in chronic kidney disease: Principal | ICD-10-CM | POA: Insufficient documentation

## 2020-02-27 DIAGNOSIS — Z888 Allergy status to other drugs, medicaments and biological substances status: Secondary | ICD-10-CM | POA: Insufficient documentation

## 2020-02-27 DIAGNOSIS — I10 Essential (primary) hypertension: Secondary | ICD-10-CM | POA: Diagnosis not present

## 2020-02-27 DIAGNOSIS — D61818 Other pancytopenia: Secondary | ICD-10-CM | POA: Diagnosis present

## 2020-02-27 DIAGNOSIS — D259 Leiomyoma of uterus, unspecified: Secondary | ICD-10-CM | POA: Insufficient documentation

## 2020-02-27 LAB — URINALYSIS, ROUTINE W REFLEX MICROSCOPIC
Bilirubin Urine: NEGATIVE
Glucose, UA: 150 mg/dL — AB
Hgb urine dipstick: NEGATIVE
Ketones, ur: NEGATIVE mg/dL
Nitrite: NEGATIVE
Protein, ur: >=300 mg/dL — AB
Specific Gravity, Urine: 1.01 (ref 1.005–1.030)
Squamous Epithelial / HPF: >50 — ABNORMAL HIGH (ref 0–5)
WBC, UA: >50 WBC/hpf — ABNORMAL HIGH (ref 0–5)
pH: 9 — ABNORMAL HIGH (ref 5.0–8.0)

## 2020-02-27 LAB — COMPREHENSIVE METABOLIC PANEL
ALT: 10 U/L (ref 0–44)
AST: 12 U/L — ABNORMAL LOW (ref 15–41)
Albumin: 3.3 g/dL — ABNORMAL LOW (ref 3.5–5.0)
Alkaline Phosphatase: 54 U/L (ref 38–126)
Anion gap: 14 (ref 5–15)
BUN: 34 mg/dL — ABNORMAL HIGH (ref 6–20)
CO2: 30 mmol/L (ref 22–32)
Calcium: 9.4 mg/dL (ref 8.9–10.3)
Chloride: 94 mmol/L — ABNORMAL LOW (ref 98–111)
Creatinine, Ser: 8.26 mg/dL — ABNORMAL HIGH (ref 0.44–1.00)
GFR calc Af Amer: 6 mL/min — ABNORMAL LOW (ref >60)
GFR calc non Af Amer: 6 mL/min — ABNORMAL LOW (ref >60)
Glucose, Bld: 139 mg/dL — ABNORMAL HIGH (ref 70–99)
Potassium: 4.2 mmol/L (ref 3.5–5.1)
Sodium: 138 mmol/L (ref 135–145)
Total Bilirubin: 0.4 mg/dL (ref 0.3–1.2)
Total Protein: 6.7 g/dL (ref 6.5–8.1)

## 2020-02-27 LAB — CBC WITH DIFFERENTIAL/PLATELET
Abs Immature Granulocytes: 0.04 10*3/uL (ref 0.00–0.07)
Basophils Absolute: 0 10*3/uL (ref 0.0–0.1)
Basophils Relative: 0 %
Eosinophils Absolute: 0.2 10*3/uL (ref 0.0–0.5)
Eosinophils Relative: 7 %
HCT: 18.9 % — ABNORMAL LOW (ref 36.0–46.0)
Hemoglobin: 6.3 g/dL — CL (ref 12.0–15.0)
Immature Granulocytes: 1 %
Lymphocytes Relative: 28 %
Lymphs Abs: 1 10*3/uL (ref 0.7–4.0)
MCH: 32.3 pg (ref 26.0–34.0)
MCHC: 33.3 g/dL (ref 30.0–36.0)
MCV: 96.9 fL (ref 80.0–100.0)
Monocytes Absolute: 0.3 10*3/uL (ref 0.1–1.0)
Monocytes Relative: 9 %
Neutro Abs: 1.9 10*3/uL (ref 1.7–7.7)
Neutrophils Relative %: 55 %
Platelets: 135 10*3/uL — ABNORMAL LOW (ref 150–400)
RBC: 1.95 MIL/uL — ABNORMAL LOW (ref 3.87–5.11)
RDW: 13 % (ref 11.5–15.5)
WBC: 3.4 10*3/uL — ABNORMAL LOW (ref 4.0–10.5)
nRBC: 0 % (ref 0.0–0.2)

## 2020-02-27 LAB — LIPASE, BLOOD: Lipase: 25 U/L (ref 11–51)

## 2020-02-27 LAB — HIV ANTIBODY (ROUTINE TESTING W REFLEX): HIV Screen 4th Generation wRfx: NONREACTIVE

## 2020-02-27 LAB — PREPARE RBC (CROSSMATCH)

## 2020-02-27 LAB — I-STAT BETA HCG BLOOD, ED (MC, WL, AP ONLY): I-stat hCG, quantitative: 9.6 m[IU]/mL — ABNORMAL HIGH (ref <5)

## 2020-02-27 LAB — PREGNANCY, URINE: Preg Test, Ur: NEGATIVE

## 2020-02-27 LAB — SARS CORONAVIRUS 2 BY RT PCR (HOSPITAL ORDER, PERFORMED IN ~~LOC~~ HOSPITAL LAB): SARS Coronavirus 2: NEGATIVE

## 2020-02-27 IMAGING — DX DG CHEST 1V PORT
1 series · 1 of 1 positions shown · non-contrast
Comparison: [DATE]

CLINICAL DATA: Cough

EXAM:
PORTABLE CHEST 1 VIEW

[chest ap]
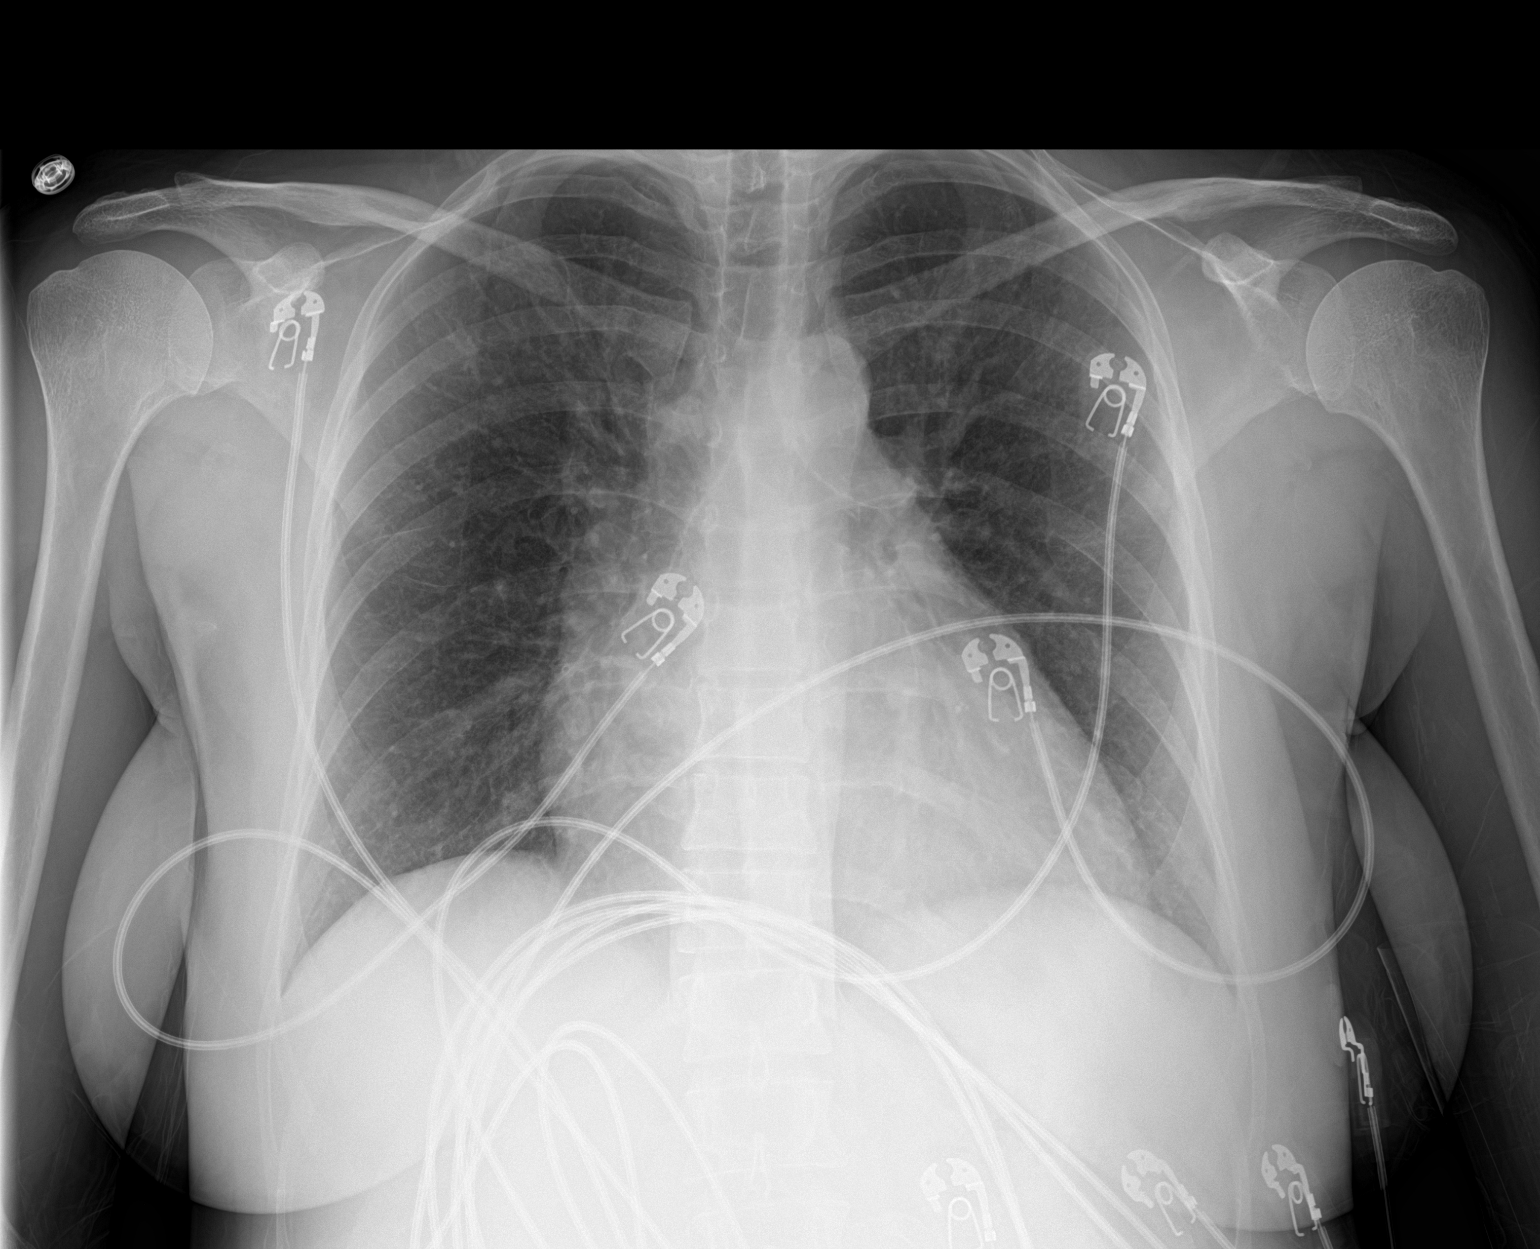

[1 of 1 positions shown; findings below may reference images not displayed]

FINDINGS: The heart size and mediastinal contours are within normal limits. No
consolidation or edema. The visualized skeletal structures are
unremarkable.
IMPRESSION: No acute process in the chest.

## 2020-02-27 MED ORDER — SODIUM CHLORIDE 0.9% IV SOLUTION: Freq: Once | INTRAVENOUS | Status: AC

## 2020-02-27 MED ORDER — PREDNISONE 5 MG PO TABS
5.0000 mg | ORAL_TABLET | Freq: Every day | ORAL | Status: DC
  Administered 2020-02-27 – 2020-02-28 (×2): 5 mg via ORAL
  Filled 2020-02-27 (×2): qty 1

## 2020-02-27 MED ORDER — METOCLOPRAMIDE HCL 5 MG/ML IJ SOLN
10.0000 mg | Freq: Once | INTRAMUSCULAR | Status: AC
  Administered 2020-02-27: 10 mg via INTRAVENOUS
  Filled 2020-02-27: qty 2

## 2020-02-27 MED ORDER — CYCLOSPORINE MODIFIED (NEORAL) 100 MG PO CAPS
200.0000 mg | ORAL_CAPSULE | Freq: Two times a day (BID) | ORAL | Status: DC
  Administered 2020-02-27 – 2020-02-28 (×2): 200 mg via ORAL
  Filled 2020-02-27 (×4): qty 2

## 2020-02-27 MED ORDER — HYDROMORPHONE HCL 1 MG/ML IJ SOLN
0.5000 mg | INTRAMUSCULAR | Status: DC | PRN
  Administered 2020-02-27 – 2020-02-28 (×2): 1 mg via INTRAVENOUS
  Administered 2020-02-28: 0.5 mg via INTRAVENOUS
  Administered 2020-02-28: 1 mg via INTRAVENOUS
  Filled 2020-02-27 (×4): qty 1

## 2020-02-27 MED ORDER — ZOLPIDEM TARTRATE 5 MG PO TABS
10.0000 mg | ORAL_TABLET | Freq: Every day | ORAL | Status: DC
  Administered 2020-02-27: 10 mg via ORAL
  Filled 2020-02-27: qty 2

## 2020-02-27 MED ORDER — LABETALOL HCL 200 MG PO TABS
200.0000 mg | ORAL_TABLET | Freq: Two times a day (BID) | ORAL | Status: DC
  Administered 2020-02-27 – 2020-02-28 (×2): 200 mg via ORAL
  Filled 2020-02-27 (×2): qty 1

## 2020-02-27 MED ORDER — HYDROMORPHONE HCL 1 MG/ML IJ SOLN
1.0000 mg | Freq: Once | INTRAMUSCULAR | Status: AC
  Administered 2020-02-27: 1 mg via INTRAVENOUS
  Filled 2020-02-27: qty 1

## 2020-02-27 MED ORDER — MYCOPHENOLATE SODIUM 180 MG PO TBEC
540.0000 mg | DELAYED_RELEASE_TABLET | Freq: Two times a day (BID) | ORAL | Status: DC
  Administered 2020-02-27 – 2020-02-28 (×2): 540 mg via ORAL
  Filled 2020-02-27 (×3): qty 3

## 2020-02-27 MED ORDER — HYDROXYZINE HCL 10 MG PO TABS
10.0000 mg | ORAL_TABLET | Freq: Once | ORAL | Status: AC
  Administered 2020-02-27: 10 mg via ORAL
  Filled 2020-02-27: qty 1

## 2020-02-27 NOTE — H&P (Signed)
Date: 02/27/2020               Patient Name:  [name] [name] MRN: [id number]  DOB: 30-Mar-1981 Age / Sex: 39 y.o., female   PCP: Patient, No Pcp Per         Medical Service: Internal Medicine Teaching Service         Attending Physician: Dr. Rayne Du att. providers found    First Contact: Dr. Marianna Payment Pager: [phone]  Second Contact: Dr. Sharon Seller  Pager: [phone]       After Hours (After 5p/  First Contact Pager: [phone]  weekends / holidays): Second Contact Pager: [phone]   Chief Complaint: weakness   History of Present Illness: [name] [name] is a 39 y/o female with HTN, T1DM s/p pancreas transplant 2014 on immunosuppression, ESRD s/p failed kidney transplant,  anemia related to chronic renal disease,  and gastroparesis who presents with progressive weakness for the last 2 weeks and acute shortness of breath w/ light headedness beginning this morning. She states her Hgb has been progressively dropping the last 2 months when she has had her labs checked with nephrology. She called her nephrologist today saying she was too weak to go to HD. She was informed that her most recent blood counts from 2 days ago had dropped to the point that she needed to go to the ED for a blood transfusion. Prior to arrival, she had a near syncopal event while taking a shower. She reports she was recently started on Epo injections with HD. She has also been having issues with recurrent abdominal pain that has been attributed to uterine fibroids. Patient denies fevers, chills, chest pain, headaches, urinary symptoms, changes in bowel habits.   Meds:  Current Meds  Medication Sig  . acetaminophen (TYLENOL) 500 MG tablet Take 1,000 mg by mouth every 6 (six) hours as needed for mild pain.  Marland Kitchen amLODipine (NORVASC) 10 MG tablet Take 10 mg by mouth daily.  Marland Kitchen aspirin EC 81 MG tablet Take 81 mg by mouth every morning.  . cycloSPORINE modified (NEORAL) 100 MG capsule Take 200 mg by mouth 2 (two) times daily.   .  furosemide (LASIX) 20 MG tablet Take 2 tablets by mouth every other day.  . labetalol (NORMODYNE) 200 MG tablet Take 200 mg by mouth 2 (two) times daily.  . mycophenolate (MYFORTIC) 180 MG EC tablet Take 540 mg by mouth 2 (two) times daily.  . predniSONE (DELTASONE) 5 MG tablet Take 5 mg by mouth daily.   Marland Kitchen zolpidem (AMBIEN) 10 MG tablet Take 10 mg by mouth at bedtime.     Allergies: Allergies as of 02/27/2020 - Review Complete 02/27/2020  Allergen Reaction Noted  . Fish-derived products Swelling 12/20/2013  . Reglan [metoclopramide] Other (See Comments) 09/14/2019  . Shrimp [shellfish allergy] Rash 11/17/2011   Past Medical History:  Diagnosis Date  . Anxiety   . Gastroparesis   . Narcotic abuse ([place])   . Non compliance with medical treatment   . Renal disorder   . Vision loss     Family History: Father with heart disease, s/p CABG   Family History  Problem Relation Age of Onset  . Hypertension Father     Social History: originally from [place]. no EtOH or tobacco use; smokes hookah approximately 1-2 times per week. No illicit drug use.   Review of Systems:A complete ROS was negative except as per HPI.   Physical Exam: Blood pressure (!) 156/79, pulse 86, temperature 97.9  F (36.6 C), temperature source Oral, resp. rate 20, height 5\' 2"  (1.575 m), weight 59.5 kg, last menstrual period 02/03/2020, SpO2 96 %.  General: NAD, nl appearance HE: Normocephalic, atraumatic , EOMI, Conjunctivae normal ENT: No congestion, no rhinorrhea, no exudate or erythema  Cardiovascular: Normal rate, regular rhythm.  No murmurs, rubs, or gallops Pulmonary : Effort normal, breath sounds normal. No wheezes, rales, or rhonchi Abdominal: soft, epigastric pain not reproduced on palpation,  bowel sounds present Ext: no swelling , deformity, injury ,or tenderness in extremities Skin: Warm, dry , no bruising Neuro: AOx4 , no focal deficits   Psychiatric/Behavioral:  normal mood, normal behavior     EKG: personally reviewed my interpretation is sinus rhythm.   CXR: personally reviewed my interpretation is no focal opacity, pleural effusions, or pneumothorax,  Assessment & Plan by Problem: Active Problems:   Symptomatic anemia   [name] [name]  is a 39 y/o female with HTN, T1DM s/p pancreas transplant 2014 on immunosuppresion, ESRD s/p failed kidney transplant,  anemia related to chronic renal disease,  and gastroparesis who presents with symptomatic anemia.   Acute on chronic anemia  Patient presents with a hemoglobin of 6.3 with symptoms of shortness of breath and dizziness. Anemia likely secondary to ESRD. Her Hgb was 5.5 2 days ago.  5 months ago her hemoglobin was 10.4, but reports her hemoglobin has been around 7 for 2 months. She was sent to the ER by her nephrologist and her pRBC transfusions were started in the ED. No other signs of blood loss including no melena, no hematochezia, and not on blood thinners.  Plan: -Follow-up CBC after transfusion of 1 unit packed red blood cells -Monitor for signs of blood loss  ESRD Patient on HD Monday Wednesday Friday, last HD session 6/23.  Consulted nephrology who will see patient on 6/26.  BUN 34, potassium 4.2, patient does not appear volume overloaded.  No urgent indication for dialysis at this time Plan: - Per nephrology  Epigastric pain Patient has history describes multiple reasons for abdominal pain. Pain is radiating to her back and colicky.  As mentioned above patient says she has had abdominal pain since her pancreas and kidney transplant. Pancreas was ultrasounded in May at [facility], nl findings. Lipase wnl. She also has a history of uterine fibroids and says she is following with [facility]. Denies any vaginal bleeding. She has a history of gastroparesis listed on her chart, but not on treatment. Denies current n/v, but had one episode earlier today. Her last bowel movement was yesterday.  Will treat pain and  continue to monitor symptoms.   Dispo: Admit patient to Observation with expected length of stay less than 2 midnights.   Diet: Renal VTE prophylaxis : SCD's Code: Full  Signed: Madalyn Rob, MD 02/27/2020, 11:30 PM  After 5pm on weekdays and 1pm on weekends: On Call pager: [phone]

## 2020-02-27 NOTE — ED Triage Notes (Addendum)
Pt is a dialysis pt (M W F) that was called and told she had a hgb of 5, which is a drop from the hgb of 7 she had 2 weeks ago.  Pt c/o dizziness, syncope, sob, stomach and chest pain.  Pt was too weak to go to dialysis today.  110/60 76 hr 99.4 temp 16 rr 174 cbg

## 2020-02-27 NOTE — ED Provider Notes (Signed)
[facility] EMERGENCY DEPARTMENT Provider Note   CSN: [id number] Arrival date & time: 02/27/20  1436     History Chief Complaint  Patient presents with  . Anemia    [name] [name] is a 39 y.o. female with PMH significant for type I DM,  chronic anemia, and ESRD on dialysis M/W/F who presents to the ED via EMS after she was informed that her hemoglobin had dropped considerably from her baseline of 7 to 5.  Patient informs me that she felt perfectly fine when she went to bed last evening, however shortly after waking up this morning and going to the bathroom, she felt profoundly short of breath.  She states that with any exertion she feels particularly weak.  She subsequently called her dialysis center to inform them that she was too weak to go and that is when they told her that she was more anemic than usual.  She is also endorsing epigastric abdominal discomfort which she states is different from her usual lower abdominal discomfort.  Patient also notes radiation of her pain symptoms to her back.  She states that she also developed a cough this morning and that she has chest discomfort with deep inspiration.  She also was nauseated and had one episode of nonbloody emesis prior to EMS arrival.  Patient still makes small amounts of urine.  She denies any recent illness or infection, fevers or chills, rhinorrhea or congestion, sore throat, headache, current chest pain, urinary symptoms, or changes in bowel habits.  She denies any melena or hematochezia.  No recent large menses.  She is not on blood thinners.  She has not received her COVID-19 vaccine.  HPI     Past Medical History:  Diagnosis Date  . Anxiety   . Gastroparesis   . Narcotic abuse ([place])   . Non compliance with medical treatment   . Renal disorder   . Vision loss     Patient Active Problem List   Diagnosis Date Noted  . Aortic atherosclerosis ([place]) 12/27/2018  . Acute respiratory failure with hypoxia  ([place]) 12/22/2018  . Hyperkalemia   . Bilateral pneumonia 12/21/2018  . Pancreas transplant status ([place]) 12/21/2018  . Immunosuppression ([place]) 12/21/2018  . Chest pain 12/21/2018  . CAP (community acquired pneumonia) 12/20/2018  . End stage renal disease ([place]) 02/06/2012  . Pleural effusion, bilateral 11/16/2011  . Anemia in chronic kidney disease (CKD) 11/16/2011  . Pericardial effusion 11/15/2011  . Fluid overload 11/15/2011  . Abdominal pain, epigastric 11/15/2011  . CKD (chronic kidney disease) stage 5, GFR less than 15 ml/min ([facility]) 11/03/2011  . Hypertension 10/27/2011  . Renal failure, chronic 10/27/2011  . MEDIAL EPICONDYLITIS, LEFT 12/17/2007  . ABSCESS, AXILLA, LEFT 12/03/2007  . DIABETES MELLITUS, TYPE I, UNCONTROLLED 11/05/2007    Past Surgical History:  Procedure Laterality Date  . AV FISTULA PLACEMENT  11/17/2011   Procedure: ARTERIOVENOUS (AV) FISTULA CREATION;  Surgeon: Rosetta Posner, MD;  Location: [facility];  Service: Vascular;  Laterality: Right;  . EYE SURGERY    . KIDNEY TRANSPLANT    . NEPHRECTOMY TRANSPLANTED ORGAN    . pancrease transplant    . REFRACTIVE SURGERY       OB History   No obstetric history on file.     Family History  Problem Relation Age of Onset  . Hypertension Father     Social History   Tobacco Use  . Smoking status: Current Some Day Smoker    Years: 1.00  Types: Pipe  . Smokeless tobacco: Never Used  . Tobacco comment: Hooka  Vaping Use  . Vaping Use: Never used  Substance Use Topics  . Alcohol use: No  . Drug use: No    Home Medications Prior to Admission medications   Medication Sig Start Date End Date Taking? Authorizing Provider  acetaminophen (TYLENOL) 500 MG tablet Take 1,000 mg by mouth every 6 (six) hours as needed for mild pain.   Yes [provider]  amLODipine (NORVASC) 10 MG tablet Take 10 mg by mouth daily. 09/19/18  Yes [provider]  aspirin EC 81 MG tablet Take 81 mg by mouth every  morning.   Yes [provider]  cycloSPORINE modified (NEORAL) 100 MG capsule Take 200 mg by mouth 2 (two) times daily.    Yes [provider]  furosemide (LASIX) 20 MG tablet Take 2 tablets by mouth every other day. 09/12/19  Yes [provider]  labetalol (NORMODYNE) 200 MG tablet Take 200 mg by mouth 2 (two) times daily. 09/12/19  Yes [provider]  mycophenolate (MYFORTIC) 180 MG EC tablet Take 540 mg by mouth 2 (two) times daily.   Yes [provider]  predniSONE (DELTASONE) 5 MG tablet Take 5 mg by mouth daily.  06/24/15  Yes [provider]  zolpidem (AMBIEN) 10 MG tablet Take 10 mg by mouth at bedtime.   Yes [provider]  promethazine (PHENERGAN) 25 MG suppository Place 1 suppository (25 mg total) rectally every 6 (six) hours as needed for nausea or vomiting. Patient not taking: Reported on 02/27/2020 02/22/14   Evelina Bucy, MD    Allergies    Fish-derived products, Reglan [metoclopramide], and Shrimp [shellfish allergy]  Review of Systems   Review of Systems  All other systems reviewed and are negative.   Physical Exam Updated Vital Signs BP (!) 149/75   Pulse 87   Temp 98 F (36.7 C) (Oral)   Resp 18   Ht 5\' 2"  (1.575 m)   Wt 59.5 kg   LMP 02/03/2020   SpO2 100%   BMI 23.99 kg/m   Physical Exam Vitals and nursing note reviewed. Exam conducted with a chaperone present.  HENT:     Head: Normocephalic and atraumatic.  Eyes:     General: No scleral icterus.    Conjunctiva/sclera: Conjunctivae normal.  Cardiovascular:     Rate and Rhythm: Normal rate and regular rhythm.     Pulses: Normal pulses.     Heart sounds: Normal heart sounds.  Pulmonary:     Effort: Pulmonary effort is normal. No respiratory distress.     Breath sounds: Normal breath sounds. No wheezing or rales.  Abdominal:     Comments: Soft, nondistended.  TTP in RUQ, epigastrium, and LUQ.  No tenderness elsewhere.  Normoactive bowel  sounds.  Musculoskeletal:     Right lower leg: No edema.     Left lower leg: No edema.  Skin:    General: Skin is dry.     Capillary Refill: Capillary refill takes less than 2 seconds.  Neurological:     General: No focal deficit present.     Mental Status: She is alert and oriented to person, place, and time.     GCS: GCS eye subscore is 4. GCS verbal subscore is 5. GCS motor subscore is 6.  Psychiatric:        Mood and Affect: Mood normal.        Behavior: Behavior normal.  Thought Content: Thought content normal.     ED Results / Procedures / Treatments   Labs (all labs ordered are listed, but only abnormal results are displayed) Labs Reviewed  CBC WITH DIFFERENTIAL/PLATELET - Abnormal; Notable for the following components:      Result Value   WBC 3.4 (*)    RBC 1.95 (*)    Hemoglobin 6.3 (*)    HCT 18.9 (*)    Platelets 135 (*)    All other components within normal limits  COMPREHENSIVE METABOLIC PANEL - Abnormal; Notable for the following components:   Chloride 94 (*)    Glucose, Bld 139 (*)    BUN 34 (*)    Creatinine, Ser 8.26 (*)    Albumin 3.3 (*)    AST 12 (*)    GFR calc non Af Amer 6 (*)    GFR calc Af Amer 6 (*)    All other components within normal limits  URINALYSIS, ROUTINE W REFLEX MICROSCOPIC - Abnormal; Notable for the following components:   APPearance TURBID (*)    pH 9.0 (*)    Glucose, UA 150 (*)    Protein, ur >=300 (*)    Leukocytes,Ua MODERATE (*)    WBC, UA >50 (*)    Bacteria, UA MANY (*)    Squamous Epithelial / LPF >50 (*)    Non Squamous Epithelial 0-5 (*)    All other components within normal limits  I-STAT BETA HCG BLOOD, ED ([facility], [facility], [facility] ONLY) - Abnormal; Notable for the following components:   I-stat hCG, quantitative 9.6 (*)    All other components within normal limits  SARS CORONAVIRUS 2 BY RT PCR (HOSPITAL ORDER, [facility])  LIPASE, BLOOD  PREGNANCY, URINE  TYPE AND SCREEN  PREPARE RBC  (CROSSMATCH)    EKG None  Radiology DG Chest Portable 1 View  Result Date: 02/27/2020 CLINICAL DATA:  Cough EXAM: PORTABLE CHEST 1 VIEW COMPARISON:  09/14/2019 FINDINGS: The heart size and mediastinal contours are within normal limits. No consolidation or edema. The visualized skeletal structures are unremarkable. IMPRESSION: No acute process in the chest. Electronically Signed   By: Macy Mis M.D.   On: 02/27/2020 15:57    Procedures .Critical Care Performed by: Corena Herter, PA-C Authorized by: Corena Herter, PA-C   Critical care provider statement:    Critical care time (minutes):  45   Critical care was necessary to treat or prevent imminent or life-threatening deterioration of the following conditions:  Circulatory failure   Critical care was time spent personally by me on the following activities:  Discussions with consultants, evaluation of patient's response to treatment, examination of patient, ordering and performing treatments and interventions, ordering and review of laboratory studies, ordering and review of radiographic studies, pulse oximetry, re-evaluation of patient's condition, obtaining history from patient or surrogate and review of old charts Comments:     Symptomatic anemia requiring transfusion of PRBC.   (including critical care time)  Medications Ordered in ED Medications  0.9 %  sodium chloride infusion (Manually program via Guardrails IV Fluids) (has no administration in time range)  metoCLOPramide (REGLAN) injection 10 mg (10 mg Intravenous Given 02/27/20 1540)  HYDROmorphone (DILAUDID) injection 1 mg (1 mg Intravenous Given 02/27/20 1716)    ED Course  I have reviewed the triage vital signs and the nursing notes.  Pertinent labs & imaging results that were available during my care of the patient were reviewed by me and considered  in my medical decision making (see chart for details).  Clinical Course as of Feb 27 1836  Fri Feb 27, 2020    1755 Spoke with hospitalist who will admit patient for ongoing management of her symptomatic anemia.   [GG]    Clinical Course User Index [GG] Reita Chard   MDM Rules/Calculators/A&P                          Patient informed me that her nephrologist, Dr. Moshe Cipro with [facility], encourage her to come to the ED and that she may require a blood transfusion given her symptomatic anemia.  Her laboratory work-up is reflective of symptomatic anemia to 6.3 hemoglobin.  She reports that her hemoglobin has been slowly dropping over the course of the past 6 months as 5 months ago it was in the 10 range.  She notes that she has been getting medications (EPO injections?) for her low hemoglobin, but is uncertain as to which medication specifically.  Given that she has been endorsing profound weakness and shortness of breath in the context of low hemoglobin, suspect that is attributed to her continually worsened hemoglobin.  She denies chest pain and her EKG is not particularly concerning for ischemia.  Do not feel troponin is needed at this time.  She also denies hx of clots and is PERC negative in context of her discomfort with deep inspirations.   While she notes that her epigastric abdominal discomfort radiating to her back is different from her usual chronic abdominal discomfort/gastroparesis, she was worked up for very similar complaints on 09/14/2019.  Patient reports that she responded well to Dilaudid and that she has been given strict instructions to avoid morphine by her nephrologist.  After I attempted to treat her symptoms with IV Reglan, she continued to endorse discomfort so I added 1 mg Dilaudid.  She is hemodynamically stable despite her symptomatic anemia and is without hypotension or tachycardia.  Provided counseled the patient regarding risks and benefits of blood transfusion.  Discussed case with Dr. Sedonia Small who personally evaluated patient and we will admit for symptomatic anemia.  Will  start transfusion with 1 unit PRBC here in the ED.  Spoke with hospitalist who will admit patient for ongoing management of her symptomatic anemia.  Shortly thereafter, her UA resulted which appears to demonstrate evidence of infection.  No flank pain, fevers, or leukocytosis.   Final Clinical Impression(s) / ED Diagnoses Final diagnoses:  Symptomatic anemia    Rx / DC Orders ED Discharge Orders    None       Corena Herter, PA-C 02/27/20 1837    Maudie Flakes, MD 02/28/20 [id number]

## 2020-02-28 DIAGNOSIS — R1013 Epigastric pain: Secondary | ICD-10-CM | POA: Diagnosis not present

## 2020-02-28 DIAGNOSIS — N250 Renal osteodystrophy: Secondary | ICD-10-CM | POA: Diagnosis not present

## 2020-02-28 DIAGNOSIS — N186 End stage renal disease: Secondary | ICD-10-CM | POA: Diagnosis not present

## 2020-02-28 DIAGNOSIS — E1022 Type 1 diabetes mellitus with diabetic chronic kidney disease: Secondary | ICD-10-CM | POA: Diagnosis not present

## 2020-02-28 DIAGNOSIS — I120 Hypertensive chronic kidney disease with stage 5 chronic kidney disease or end stage renal disease: Secondary | ICD-10-CM

## 2020-02-28 DIAGNOSIS — Z9483 Pancreas transplant status: Secondary | ICD-10-CM

## 2020-02-28 DIAGNOSIS — E8770 Fluid overload, unspecified: Secondary | ICD-10-CM | POA: Diagnosis not present

## 2020-02-28 DIAGNOSIS — D631 Anemia in chronic kidney disease: Secondary | ICD-10-CM | POA: Diagnosis not present

## 2020-02-28 DIAGNOSIS — Z992 Dependence on renal dialysis: Secondary | ICD-10-CM | POA: Diagnosis not present

## 2020-02-28 LAB — CBC
HCT: 22.5 % — ABNORMAL LOW (ref 36.0–46.0)
HCT: 25.5 % — ABNORMAL LOW (ref 36.0–46.0)
Hemoglobin: 7.6 g/dL — ABNORMAL LOW (ref 12.0–15.0)
Hemoglobin: 8.5 g/dL — ABNORMAL LOW (ref 12.0–15.0)
MCH: 31.9 pg (ref 26.0–34.0)
MCH: 30.8 pg (ref 26.0–34.0)
MCHC: 33.8 g/dL (ref 30.0–36.0)
MCHC: 33.3 g/dL (ref 30.0–36.0)
MCV: 94.5 fL (ref 80.0–100.0)
MCV: 92.4 fL (ref 80.0–100.0)
Platelets: 129 10*3/uL — ABNORMAL LOW (ref 150–400)
Platelets: 144 10*3/uL — ABNORMAL LOW (ref 150–400)
RBC: 2.38 MIL/uL — ABNORMAL LOW (ref 3.87–5.11)
RBC: 2.76 MIL/uL — ABNORMAL LOW (ref 3.87–5.11)
RDW: 13.7 % (ref 11.5–15.5)
RDW: 14.1 % (ref 11.5–15.5)
WBC: 4 10*3/uL (ref 4.0–10.5)
WBC: 4.6 10*3/uL (ref 4.0–10.5)
nRBC: 0 % (ref 0.0–0.2)
nRBC: 0 % (ref 0.0–0.2)

## 2020-02-28 LAB — PREPARE RBC (CROSSMATCH)

## 2020-02-28 LAB — MRSA PCR SCREENING: MRSA by PCR: NEGATIVE

## 2020-02-28 LAB — HCG, QUANTITATIVE, PREGNANCY: hCG, Beta Chain, Quant, S: 2 m[IU]/mL (ref <5)

## 2020-02-28 MED ORDER — DIMENHYDRINATE 50 MG PO TABS
50.0000 mg | ORAL_TABLET | Freq: Four times a day (QID) | ORAL | Status: DC | PRN
  Filled 2020-02-28 (×2): qty 1

## 2020-02-28 MED ORDER — CHLORHEXIDINE GLUCONATE CLOTH 2 % EX PADS
6.0000 | MEDICATED_PAD | Freq: Every day | CUTANEOUS | Status: DC
  Administered 2020-02-28: 6 via TOPICAL

## 2020-02-28 MED ORDER — HYDROMORPHONE HCL 1 MG/ML IJ SOLN
1.0000 mg | Freq: Once | INTRAMUSCULAR | Status: AC
  Administered 2020-02-28: 1 mg via INTRAVENOUS
  Filled 2020-02-28: qty 1

## 2020-02-28 MED ORDER — SODIUM CHLORIDE 0.9% IV SOLUTION: Freq: Once | INTRAVENOUS | Status: DC

## 2020-02-28 MED ORDER — PROMETHAZINE HCL 25 MG/ML IJ SOLN
12.5000 mg | Freq: Once | INTRAMUSCULAR | Status: AC
  Administered 2020-02-28: 12.5 mg via INTRAVENOUS
  Filled 2020-02-28: qty 1

## 2020-02-28 MED ORDER — METOCLOPRAMIDE HCL 5 MG/ML IJ SOLN: 5.0000 mg | Freq: Once | INTRAMUSCULAR | Status: DC

## 2020-02-28 MED ORDER — SODIUM CHLORIDE 0.9 % IV SOLN
510.0000 mg | Freq: Once | INTRAVENOUS | Status: AC
  Administered 2020-02-28: 510 mg via INTRAVENOUS
  Filled 2020-02-28: qty 17

## 2020-02-28 NOTE — Discharge Instructions (Signed)
Please make sure to make a follow up appointment with a Primary Care Physician.

## 2020-02-28 NOTE — Progress Notes (Signed)
Patient arrived to unit [facility] from emergency department bed 18. Patient alert and oriented x 4 .No acute distress noted.Educated patient to nursing unit and call bell.Educated patient to call for assistance prior to getting out of bed.Bed alarm set for safety.

## 2020-02-28 NOTE — Consult Note (Addendum)
[facility] Renal Consultation Note    Indication for Consultation:  Management of ESRD/hemodialysis, anemia, hypertension/volume, and secondary hyperparathyroidism. PCP:  HPI: [name] [name] is a 39 y.o. female with ESRD (Hx prior kid Tx 2014, failed/resumed HD), T1DM (s/p pancreas Tx - still functional/on immunosupps), anxiety, HTN who was admitted with severe symptomatic anemia.  Reports that has been feeling incredibly fatigued/drained for the past few weeks. Noted to have declining Hgb at dialysis over the past month without clear cause. Hgb 11.4 (5/12) -> 8.2 (5/28) -> 8.2 (6/2) -> 7 (6/9) -> 5.5 (6/23). Having a lot of dyspnea with minimal exertion and anxiety. Has regular menstrual cycles, not heavier than usual. Has not noticed any black or red stools, but has had ongoing epigastric pains. She has not gett getting her ESA regularly - looks like was given on 02/25/20, but last dose prior to that was 4/19.  S/p [facility]F admit in early May (5/4-5/6) with abdominal pain - initial concern for pyelo of transplanted kidney - got few days of abx. No change to immunosuppressants.  Called her dialysis unit on 6/25 to let them know she wouldn't;t be coming as she felt too weak to even get out of bed. They noted her last Hgb had resulted at 5.5 and directed her to come to the ED for blood transfusion.  In the ED, her Hgb was noted to be 6.3. Rest of labs showed K 4.2, BUN 34, WBC 3.4, Plts 135. CXR clear. She was transfused 1U PRBCs overnight.  Today - she still feels awful - so wiped out that she can barely move. She is upset and crying, feels anxious. + dyspnea with minimal movements and occ CP. No N/V, diarrhea, fevers. hgb up to 7.6 today   Past Medical History:  Diagnosis Date  . Anxiety   . Gastroparesis   . Narcotic abuse ([place])   . Non compliance with medical treatment   . Renal disorder   . Vision loss    Past Surgical History:  Procedure Laterality Date  . AV FISTULA  PLACEMENT  11/17/2011   Procedure: ARTERIOVENOUS (AV) FISTULA CREATION;  Surgeon: Rosetta Posner, MD;  Location: [facility];  Service: Vascular;  Laterality: Right;  . EYE SURGERY    . KIDNEY TRANSPLANT    . NEPHRECTOMY TRANSPLANTED ORGAN    . pancrease transplant    . REFRACTIVE SURGERY     Family History  Problem Relation Age of Onset  . Hypertension Father    Social History:  reports that she has been smoking pipe. She has smoked for the past 1.00 year. She has never used smokeless tobacco. She reports that she does not drink alcohol and does not use drugs.  ROS: As per HPI otherwise negative.  Physical Exam: Vitals:   02/27/20 2236 02/28/20 0149 02/28/20 0452 02/28/20 0939  BP: (!) 156/79 140/69 (!) 149/73 (!) 161/73  Pulse: 86 89 85 85  Resp: 20 20 20 18   Temp: 97.9 F (36.6 C) 97.8 F (36.6 C) (!) 97.4 F (36.3 C) (!) 97.3 F (36.3 C)  TempSrc: Oral Oral Oral Oral  SpO2: 96% 98% 96% 98%  Weight:      Height:         General: Well developed, well nourished, upset/crying today. Head: Normocephalic, atraumatic, sclera non-icteric, mucus membranes are moist. Neck: Supple without lymphadenopathy/masses. JVD not elevated. Lungs: Clear bilaterally to auscultation without wheezes, rales, or rhonchi. Breathing is unlabored. Heart: RRR; 2/6 murmur Abdomen: Soft, tender to  palpation across epigastric area. No rebound tenderness/guarding. Musculoskeletal:  Strength and tone appear normal for age. Lower extremities: No edema or ischemic changes, no open wounds. Neuro: Alert and oriented X 3. Moves all extremities spontaneously. Psych:  Responds to questions appropriately , upset today. Dialysis Access: AVF + bruit  Allergies  Allergen Reactions  . Fish-Derived Products Swelling  . Reglan [Metoclopramide] Other (See Comments)    Pt gets akathisia  . Shrimp [Shellfish Allergy] Rash   Prior to Admission medications   Medication Sig Start Date End Date Taking? Authorizing Provider   acetaminophen (TYLENOL) 500 MG tablet Take 1,000 mg by mouth every 6 (six) hours as needed for mild pain.   Yes [provider]  amLODipine (NORVASC) 10 MG tablet Take 10 mg by mouth daily. 09/19/18  Yes [provider]  aspirin EC 81 MG tablet Take 81 mg by mouth every morning.   Yes [provider]  cycloSPORINE modified (NEORAL) 100 MG capsule Take 200 mg by mouth 2 (two) times daily.    Yes [provider]  furosemide (LASIX) 20 MG tablet Take 2 tablets by mouth every other day. 09/12/19  Yes [provider]  labetalol (NORMODYNE) 200 MG tablet Take 200 mg by mouth 2 (two) times daily. 09/12/19  Yes [provider]  mycophenolate (MYFORTIC) 180 MG EC tablet Take 540 mg by mouth 2 (two) times daily.   Yes [provider]  predniSONE (DELTASONE) 5 MG tablet Take 5 mg by mouth daily.  06/24/15  Yes [provider]  zolpidem (AMBIEN) 10 MG tablet Take 10 mg by mouth at bedtime.   Yes [provider]  promethazine (PHENERGAN) 25 MG suppository Place 1 suppository (25 mg total) rectally every 6 (six) hours as needed for nausea or vomiting. Patient not taking: Reported on 02/27/2020 02/22/14   Evelina Bucy, MD   Current Facility-Administered Medications  Medication Dose Route Frequency Provider Last Rate Last Admin  . Chlorhexidine Gluconate Cloth 2 % PADS 6 each  6 each Topical Q0600 Loren Racer, PA-C   6 each at 02/28/20 4580  . cycloSPORINE modified (NEORAL) capsule 200 mg  200 mg Oral BID Bloomfield, Carley D, DO   200 mg at 02/28/20 1006  . HYDROmorphone (DILAUDID) injection 0.5-1 mg  0.5-1 mg Intravenous Q4H PRN Bloomfield, Carley D, DO   0.5 mg at 02/28/20 1006  . labetalol (NORMODYNE) tablet 200 mg  200 mg Oral BID [place]field, Carley D, DO   200 mg at 02/27/20 2250  . mycophenolate (MYFORTIC) EC tablet 540 mg  540 mg Oral BID Bloomfield, Carley D, DO   540 mg at 02/28/20 1006  . predniSONE (DELTASONE) tablet 5  mg  5 mg Oral Daily Bloomfield, Carley D, DO   5 mg at 02/27/20 2116  . zolpidem (AMBIEN) tablet 10 mg  10 mg Oral QHS Bloomfield, Carley D, DO   10 mg at 02/27/20 2250   Labs: Basic Metabolic Panel: Recent Labs  Lab 02/27/20 1521  NA 138  K 4.2  CL 94*  CO2 30  GLUCOSE 139*  BUN 34*  CREATININE 8.26*  CALCIUM 9.4   Liver Function Tests: Recent Labs  Lab 02/27/20 1521  AST 12*  ALT 10  ALKPHOS 54  BILITOT 0.4  PROT 6.7  ALBUMIN 3.3*   Recent Labs  Lab 02/27/20 1521  LIPASE 25   CBC: Recent Labs  Lab 02/27/20 1521 02/28/20 0323  WBC 3.4* 4.0  NEUTROABS 1.9  --  HGB 6.3* 7.6*  HCT 18.9* 22.5*  MCV 96.9 94.5  PLT 135* 129*   Studies/Results: DG Chest Portable 1 View  Result Date: 02/27/2020 CLINICAL DATA:  Cough EXAM: PORTABLE CHEST 1 VIEW COMPARISON:  09/14/2019 FINDINGS: The heart size and mediastinal contours are within normal limits. No consolidation or edema. The visualized skeletal structures are unremarkable. IMPRESSION: No acute process in the chest. Electronically Signed   By: Macy Mis M.D.   On: 02/27/2020 15:57   Dialysis Orders:  MWF at [facility] 3:45hr, 400/A1.5, EDW 60kg, 2K/2.25Ca, UFP#4, AVF, heparin 3000 bolus - Venofer 50mg  IV weekly - Mircera 200 a 2 weeks (last given 6/21). OP Hgb 5.5, tsat 37% on 6/23. - Hectoral 73mcg IV q HD  Assessment/Plan: 1. Symptomatic anemia: Steady decline in Hgb over past month - unclear cause, but likely multifactorial. No clear GI bleed, but having diffuse epigastric pain, could be slow bleed. Looks like she has not been getting her ESA regularly as well - unclear reasons behind this, whether she missed the day was supposed to be given? Will call her center to inquire. She did get a dose earlier this week. She does have regular menstrual cycles. She is not iron deficient. She is on immunosuppressants (Myfortic in particular) which can cause pancytopenia. -- Given ongoing severe symptoms, will order another 1U  PRBCs. Consider GI consult given epigastric pain. Will be due for ESA dosing again in 10d. 2.  ESRD: Usual MWF schedule. Missed HD yesterday d/t being here. Ordered for HD today - she says she feels awful and plans to refuse it. Follow closely. 3.  Hypertension/volume: BP variable - CXR clear, no edema on exam. 4.  Metabolic bone disease: Ca ok, Phos pending. Continue home meds. 5.  Nutrition:  Alb low - add pro-stat. 6.  T1DM, s/p pancreas Tx: On cyclosporine + Myfortic + Pred. 7.  Anxiety: Chronic issue - worse in setting of anemia.   Veneta Penton, PA-C 02/28/2020, 10:25 AM  [facility]  Patient seen and examined, agree with above note with above modifications. Well known to me-  39 year old female with ESRD and non compliance. Has been doing better of late however.  hgb been dropping in the OP setting-  Unclear why wasn't restarted on ESA after 5/28 value-  Definitely could be a contributer- no obvious bleed but I agree with abdominal pain a GI source cannot be ruled out.  Would like to do HD today to get her 3 treatments in this week-  Continue other renal related medications  Corliss Parish, MD 02/28/2020

## 2020-02-28 NOTE — Progress Notes (Signed)
HD#0 Subjective:  Overnight Events: Admitted overnight.   She is feeling very weak today. She's very concerned about her hemoglobin being low. She has not had any bleeding from anywhere that she has noticed. She also has been very cold lately.   She does not have a PCP and will likely need one at discharge.  Objective:  Vital signs in last 24 hours: Vitals:   02/27/20 2145 02/27/20 2236 02/28/20 0149 02/28/20 0452  BP: (!) 158/87 (!) 156/79 140/69 (!) 149/73  Pulse:  86 89 85  Resp: 20 20 20 20   Temp:  97.9 F (36.6 C) 97.8 F (36.6 C) (!) 97.4 F (36.3 C)  TempSrc:  Oral Oral Oral  SpO2:  96% 98% 96%  Weight:      Height:       Supplemental O2: Room Air SpO2: 96 % O2 Flow Rate (L/min): n/a   Physical Exam:  Physical Exam Constitutional:      General: She is not in acute distress. HENT:     Head: Normocephalic and atraumatic.  Eyes:     Extraocular Movements: Extraocular movements intact.  Cardiovascular:     Rate and Rhythm: Normal rate.     Pulses: Normal pulses.  Pulmonary:     Effort: Pulmonary effort is normal. No respiratory distress.     Breath sounds: Normal breath sounds.  Abdominal:     General: Abdomen is flat. There is no distension.     Tenderness: There is abdominal tenderness.  Musculoskeletal:        General: Normal range of motion.  Skin:    General: Skin is warm and dry.  Neurological:     General: No focal deficit present.     Mental Status: She is alert and oriented to person, place, and time. Mental status is at baseline.  Psychiatric:        Mood and Affect: Mood is anxious. Affect is tearful.     Filed Weights   02/27/20 1444  Weight: 59.5 kg     Intake/Output Summary (Last 24 hours) at 02/28/2020 0622 Last data filed at 02/27/2020 2130 Gross per 24 hour  Intake 0 ml  Output --  Net 0 ml   Net IO Since Admission: 0 mL [02/28/20 0622]  Pertinent Labs: CBC Latest Ref Rng & Units 02/28/2020 02/27/2020 09/14/2019  WBC 4.0  - 10.5 K/uL 4.0 3.4(L) 9.3  Hemoglobin 12.0 - 15.0 g/dL 7.6(L) 6.3(LL) 10.4(L)  Hematocrit 36 - 46 % 22.5(L) 18.9(L) 32.4(L)  Platelets 150 - 400 K/uL 129(L) 135(L) 135(L)    CMP Latest Ref Rng & Units 02/27/2020 09/14/2019 12/27/2018  Glucose 70 - 99 mg/dL 139(H) 89 117(H)  BUN 6 - 20 mg/dL 34(H) 59(H) 32(H)  Creatinine 0.44 - 1.00 mg/dL 8.26(H) 10.60(H) 8.48(H)  Sodium 135 - 145 mmol/L 138 136 138  Potassium 3.5 - 5.1 mmol/L 4.2 5.4(H) 3.7  Chloride 98 - 111 mmol/L 94(L) 102 96(L)  CO2 22 - 32 mmol/L 30 21(L) 25  Calcium 8.9 - 10.3 mg/dL 9.4 9.5 9.6  Total Protein 6.5 - 8.1 g/dL 6.7 7.3 -  Total Bilirubin 0.3 - 1.2 mg/dL 0.4 0.9 -  Alkaline Phos 38 - 126 U/L 54 67 -  AST 15 - 41 U/L 12(L) 17 -  ALT 0 - 44 U/L 10 17 -    Pending Labs: BHCG  Imaging: DG Chest Portable 1 View  Result Date: 02/27/2020 CLINICAL DATA:  Cough EXAM: PORTABLE CHEST 1 VIEW COMPARISON:  09/14/2019  FINDINGS: The heart size and mediastinal contours are within normal limits. No consolidation or edema. The visualized skeletal structures are unremarkable. IMPRESSION: No acute process in the chest. Electronically Signed   By: Macy Mis M.D.   On: 02/27/2020 15:57    Assessment/Plan:   Principal Problem:   Symptomatic anemia Active Problems:   Abdominal pain, epigastric   End stage renal disease (HCC)    has a past medical history of Anxiety, Gastroparesis, Narcotic abuse ([place]), Non compliance with medical treatment, Renal disorder, and Vision loss.  Patient Summary: [name] [name] is a 39 y.o. with a pertinent PMH of  HTN, T1DM s/p pancreas transplant 2014 on immunosuppresion, ESRD s/p failed kidney transplant, anemia, gastroparesis who presented with fatigue, shortness of breath and admitted for symptomatic anemia. She is on hospital day 1 and continues to complain of significant fatigue.    Acute on chronic anemia  Patient continues to complain of significant fatigue.  Her symptoms are  particularly distressing to her and she was frequently tearful in the room.  I reassured her that her fatigue will improve with a blood transfusion.  She continues to deny any overt symptoms/signs of bleeding.  She does have a history of elevated beta hCG.  She denies heavy menstrual bleeding. She is now s/p 1 units of PRBCs with improvement of her Hgb to 7.6.  - Continue to monitor her CBC for changes in Hgb - Give iron transfusion today. - May require another unit of blood with dialysis due to on going symptoms, but will hold off for now to avoid volume overload.   ESRD Patient will likely get HD today  - HD per nephrology - Appreciate their assistance.   Epigastric pain Thought to be secondary to uterine fibroids, but does have an elevated BhCG concerning for pregnancy vs ovarian mass. This could also just be a result of hormone furcations with dialysis. She continues to have nausea today but denies emesis or diarrhea.   - B hCG pending, if positive then will need TV U/S - Avoid opioid pain medicine until lab results confirmed.  Diet: Renal IVF: None,None VTE: SCDs Code: Full PT/OT recs: None, none. TOC recs: needs a PCP   Dispo: Anticipated discharge to Home in 0 days.    Marianna Payment, D.O. [facility], PGY-1 Date 02/28/2020 Time 6:22 AM Pager: [phone] Please contact the on call pager after 5 pm and on weekends at [phone].

## 2020-02-28 NOTE — Discharge Summary (Addendum)
Name: [name] [name] MRN: [id number] DOB: Jun 22, 1981 39 y.o. PCP: Patient, No Pcp Per  Date of Admission: 02/27/2020  2:37 PM Date of Discharge: 02/28/20 Attending Physician: Sid Falcon, MD  Discharge Diagnosis: 1. Symptomatic Anemia of Chronic Disease 2. End Stage Renal Disease  Discharge Medications: Allergies as of 02/28/2020      Reactions   Fish-derived Products Swelling   Reglan [metoclopramide] Other (See Comments)   Pt gets akathisia   Shrimp [shellfish Allergy] Rash      Medication List    TAKE these medications   acetaminophen 500 MG tablet Commonly known as: TYLENOL Take 1,000 mg by mouth every 6 (six) hours as needed for mild pain.   amLODipine 10 MG tablet Commonly known as: NORVASC Take 10 mg by mouth daily.   aspirin EC 81 MG tablet Take 81 mg by mouth every morning.   cycloSPORINE modified 100 MG capsule Commonly known as: NEORAL Take 200 mg by mouth 2 (two) times daily.   furosemide 20 MG tablet Commonly known as: LASIX Take 2 tablets by mouth every other day.   labetalol 200 MG tablet Commonly known as: NORMODYNE Take 200 mg by mouth 2 (two) times daily.   mycophenolate 180 MG EC tablet Commonly known as: MYFORTIC Take 540 mg by mouth 2 (two) times daily.   predniSONE 5 MG tablet Commonly known as: DELTASONE Take 5 mg by mouth daily.   promethazine 25 MG suppository Commonly known as: PHENERGAN Place 1 suppository (25 mg total) rectally every 6 (six) hours as needed for nausea or vomiting.   zolpidem 10 MG tablet Commonly known as: AMBIEN Take 10 mg by mouth at bedtime.            Durable Medical Equipment  (From admission, onward)         Start     Ordered   02/28/20 1707  DME Walker  Once       Question Answer Comment  Walker: With 5 Inch Wheels   Patient needs a walker to treat with the following condition Weakness      02/28/20 1709          Disposition and follow-up:   [name] was  discharged from [facility] in [place] condition.  At the hospital follow up visit please address:  1.  Acute on chronic anemia - please check cbc  2. ESRD - Please ensure she attends her scheduled dialysis  2.  Labs / imaging needed at time of follow-up: cbc  3.  Pending labs/ test needing follow-up: N/A  Follow-up Appointments:   Hospital Course by problem list:  1. Acute on chronic anemia: [name] [name] is a 39 y.o. with a pertinent PMH of  HTN,T1DMs/p pancreas transplant 2014 on immunosuppresion, ESRDs/pfailed kidney transplant, anemia, gastroparesis who presented with fatigue and shortness of breath. She was found to have hemoglobin of  6.3 without obvious signs of active bleed although she did endorse heavy bleeding during menstrual periods. She was provided 2 units during hospitalization as well as iron infusion with significant improvement in her symptoms and discharged w/ recommendation to f/u with PCP.  2. ESRD: Noted to have missed dialysis session in outpatient setting. Received 1 session of hemodialysis during admission. Discharged w/ recommendation to c/w dialysis at regularly scheduled sessions.  Discharge Vitals:   BP (!) 157/70 (BP Location: Left Arm)    Pulse 86    Temp 98.1 F (36.7 C) (Oral)  Resp 16    Ht 5\' 2"  (1.575 m)    Wt 64.8 kg    LMP 02/03/2020    SpO2 97%    BMI 26.13 kg/m   Pertinent Labs, Studies, and Procedures:  BMP Latest Ref Rng & Units 02/27/2020 09/14/2019 12/27/2018  Glucose 70 - 99 mg/dL 139(H) 89 117(H)  BUN 6 - 20 mg/dL 34(H) 59(H) 32(H)  Creatinine 0.44 - 1.00 mg/dL 8.26(H) 10.60(H) 8.48(H)  Sodium 135 - 145 mmol/L 138 136 138  Potassium 3.5 - 5.1 mmol/L 4.2 5.4(H) 3.7  Chloride 98 - 111 mmol/L 94(L) 102 96(L)  CO2 22 - 32 mmol/L 30 21(L) 25  Calcium 8.9 - 10.3 mg/dL 9.4 9.5 9.6   CBC Latest Ref Rng & Units 02/28/2020 02/28/2020 02/27/2020  WBC 4.0 - 10.5 K/uL 4.6 4.0 3.4(L)  Hemoglobin 12.0 - 15.0 g/dL 8.5(L) 7.6(L)  6.3(LL)  Hematocrit 36 - 46 % 25.5(L) 22.5(L) 18.9(L)  Platelets 150 - 400 K/uL 144(L) 129(L) 135(L)   Discharge Instructions: Discharge Instructions    Diet - low sodium heart healthy   Complete by: As directed    Discharge instructions   Complete by: As directed    Dear [name]  You came to [place] with weakness and shortness of breath. We have determined this was caused by missing dialysis. Here are our recommendations for you at discharge:  Please make sure to go to your regularly scheduled dialysis sessions.  Thank you for choosing [facility]   Increase activity slowly   Complete by: As directed      Signed: Mosetta Anis, MD 02/28/2020, 5:11 PM Pager: [phone] After 5pm on weekdays and 1pm on weekends: On Call Pager: [phone]

## 2020-02-28 NOTE — Evaluation (Signed)
Physical Therapy Evaluation Patient Details Name: [name] [name] MRN: [id number] DOB: 10-Oct-1980 Today's Date: 02/28/2020   History of Present Illness  [name] [name] is a 39 y/o female with HTN, T1DM s/p pancreas transplant 2014 on immunosuppression, ESRD s/p failed kidney transplant,  anemia related to chronic renal disease,  and gastroparesis who presents with progressive weakness for the last 2 weeks and acute shortness of breath w/ light headedness beginning this morning. She states her Hgb has been progressively dropping the last 2 months when she has had her labs checked with nephrology. She called her nephrologist today saying she was too weak to go to HD. She was informed that her most recent blood counts from 2 days ago had dropped to the point that she needed to go to the ED for a blood transfusion. Prior to arrival, she had a near syncopal event while taking a shower  Clinical Impression  Patient received in bed. Pleasant, reports she is still feeling very weak. Hgb up to 7.6. She reports she should be getting another transfusion today. She performed bed mobility independently. Transfers with supervision. Cues to take her time. She ambulated 200 feet with RW on room air. Sats dropped to 80%s with ambulation. O2 returned to patient and sats quickly rose to mid 90%s. She reports mild dizziness after walking. She will continue to benefit from skilled PT while here to improve activity tolerance and strength for safe return home.      Follow Up Recommendations No PT follow up    Equipment Recommendations  Rolling walker with 5" wheels    Recommendations for Other Services       Precautions / Restrictions Precautions Precautions: Fall Restrictions Weight Bearing Restrictions: No      Mobility  Bed Mobility Overal bed mobility: Independent                Transfers Overall transfer level: Independent Equipment used: Rolling walker (2 wheeled)              General transfer comment: requires increased time with transitioning  Ambulation/Gait Ambulation/Gait assistance: Min guard Gait Distance (Feet): 200 Feet Assistive device: Rolling walker (2 wheeled) Gait Pattern/deviations: Step-through pattern Gait velocity: decr   General Gait Details: steady with use of RW. No lob or significant dizziness reported during ambulation. O2 sats on room air dropped into 80%s. Returned to > 90 when 2 liters via [place] returned to patient  Financial trader [place] (Stroke Patients Only)       Balance Overall balance assessment: Needs assistance Sitting-balance support: Feet supported Sitting balance-Leahy Scale: Good     Standing balance support: Bilateral upper extremity supported;During functional activity Standing balance-Leahy Scale: Fair Standing balance comment: reliant on UE assist for safety at this time                             Pertinent Vitals/Pain Pain Assessment: Faces Faces Pain Scale: Hurts a little bit Pain Location: left side of abdomen Pain Descriptors / Indicators: Sore Pain Intervention(s): Monitored during session    Home Living Family/patient expects to be discharged to:: Private residence Living Arrangements: Alone   Type of Home: Apartment Home Access: Level entry     Home Layout: One level Home Equipment: Shower seat      Prior Function Level of Independence: Independent  Comments: Uses wheelchair sometimes when leaving dialysis     Hand Dominance        Extremity/Trunk Assessment   Upper Extremity Assessment Upper Extremity Assessment: Generalized weakness    Lower Extremity Assessment Lower Extremity Assessment: Generalized weakness    Cervical / Trunk Assessment Cervical / Trunk Assessment: Normal  Communication   Communication: No difficulties  Cognition Arousal/Alertness: Awake/alert Behavior During Therapy: WFL for tasks  assessed/performed Overall Cognitive Status: Within Functional Limits for tasks assessed                                        General Comments      Exercises     Assessment/Plan    PT Assessment Patient needs continued PT services  PT Problem List Decreased strength;Decreased mobility;Decreased activity tolerance;Decreased knowledge of use of DME       PT Treatment Interventions DME instruction;Therapeutic activities;Gait training;Therapeutic exercise;Functional mobility training;Patient/family education    PT Goals (Current goals can be found in the Care Plan section)  Acute Rehab PT Goals Patient Stated Goal: to return home by tomorrow hopefully, feel stronger PT Goal Formulation: With patient Time For Goal Achievement: 03/05/20 Potential to Achieve Goals: Good    Frequency Min 3X/week   Barriers to discharge Decreased caregiver support      Co-evaluation               AM-PAC PT "6 Clicks" Mobility  Outcome Measure Help needed turning from your back to your side while in a flat bed without using bedrails?: None Help needed moving from lying on your back to sitting on the side of a flat bed without using bedrails?: None Help needed moving to and from a bed to a chair (including a wheelchair)?: A Little Help needed standing up from a chair using your arms (e.g., wheelchair or bedside chair)?: A Little Help needed to walk in hospital room?: A Little Help needed climbing 3-5 steps with a railing? : A Lot 6 Click Score: 19    End of Session Equipment Utilized During Treatment: Gait belt Activity Tolerance: Patient tolerated treatment well;Patient limited by lethargy Patient left: in bed;with call bell/phone within reach;with bed alarm set Nurse Communication: Mobility status PT Visit Diagnosis: Muscle weakness (generalized) (M62.81);Difficulty in walking, not elsewhere classified (R26.2)    Time: 8413-2440 PT Time Calculation (min) (ACUTE  ONLY): 20 min   Charges:   PT Evaluation $PT Eval Moderate Complexity: 1 Mod PT Treatments $Gait Training: 8-22 mins        Quintan Saldivar, PT, GCS 02/28/20,11:33 AM

## 2020-02-28 NOTE — Care Management Important Message (Signed)
Important Message  Patient Details  Name: [name] [name] MRN: [id number] Date of Birth: 1981-01-25   Medicare Important Message Given:   Yes     Norina Buzzard, RN 02/28/2020, 11:16 AM

## 2020-02-28 NOTE — Progress Notes (Addendum)
Paged by RN regarding patient being in extreme distress with abdominal and back pain similar to presentation. Patient evaluated at bedside. She is tearful on examination and endorsing ongoing generalized fatigue and epigastric pain. She also endorses nausea. She is requesting pain and nausea medication. QTc borderline prolonged on admission EKG.  Physical exam: VS: Afebrile, BP 157/70, HR 86, RR 16, SpO2 97% on 2L [place]  Constitutional: well-developed, well-nourished female, tearful HENT: normocephalic and atraumatic, EOMI nl, conjunctivae nl Cardiovascular: RRR, S1 and S2 present, no m/r/g, distal pulses intact Respiratory: No respiratory distress, 2L [place], normal breath sounds GI/Abdomen: Abdomen nondistended, soft, normoactive bowel sounds, diffuse tenderness to palpation Neurologic: alert and oriented x4, no apparent focal deficits present  Plan: - IV dilaudid 1mg  x1 - IV phenergan 12.5mg  x1  - Discussed with patient that Hb levels improved following transfusion and she is stable for discharge; she is tearful but expresses understanding and is agreeable with this. She will continue dialysis per schedule and f/u with Dr. Moshe Cipro; may need to resume ESA.

## 2020-02-28 NOTE — Procedures (Signed)
Patient seen and examined on Hemodialysis. BP (!) 181/75 (BP Location: Left Arm)   Pulse 81   Temp 97.7 F (36.5 C) (Oral)   Resp 18   Ht 5\' 2"  (1.575 m)   Wt 64.8 kg   LMP 02/03/2020   SpO2 94%   BMI 26.13 kg/m   QB 400 mL/ min via RUE AVF UF goal 2L  Tolerating treatment without complaints at this time.  Feeling better after another unit of pRBCS on dialysis.   Madelon Lips MD [facility] pgr [phone] 4:12 PM

## 2020-02-28 NOTE — Plan of Care (Signed)
  Problem: Activity: Goal: Risk for activity intolerance will decrease Outcome: Progressing   

## 2020-02-28 NOTE — Progress Notes (Signed)
Patient taken to ED via stretcher for discharge pickup at 2010. Instructions given to patient by prior RN per report. No questions per patient at discharge. All belongings given to patient. Follow up appointments per discharge instructions.

## 2020-02-29 ENCOUNTER — Telehealth: Payer: Self-pay | Admitting: Nephrology

## 2020-02-29 LAB — TYPE AND SCREEN
ABO/RH(D): O POS
Antibody Screen: NEGATIVE
Unit division: 0
Unit division: 0

## 2020-02-29 LAB — BPAM RBC
Blood Product Expiration Date: 202107272359
Blood Product Expiration Date: 202107292359
ISSUE DATE / TIME: 202106251817
ISSUE DATE / TIME: 202106261305
Unit Type and Rh: 5100
Unit Type and Rh: 5100

## 2020-02-29 NOTE — Telephone Encounter (Signed)
Transition of Care Contact from [place]  Date of Discharge: 6/26 Date of Contact: 02/29/20 Method of contact: phone  Attempted to contact patient to discuss transition of care from inpatient admission. Patient did not answer the phone.  Message was left on patient's voicemail informing them we would attempt to call them again and if unable to reach will follow up at dialysis.  Jen Mow, PA-C [name] Pager: [phone]

## 2020-03-01 ENCOUNTER — Encounter (HOSPITAL_COMMUNITY): Payer: Medicare Other

## 2020-03-01 ENCOUNTER — Telehealth: Payer: Self-pay | Admitting: Nephrology

## 2020-03-01 NOTE — Telephone Encounter (Signed)
Transition of care contact from inpatient facility  Date of Discharge: 02/28/20 Date of Contact: 03/01/20 Method of contact: Phone  Attempted to contact patient to discuss transition of care from inpatient admission. Patient did not answer the phone. Message was left on the patient's voicemail with call back number [phone].

## 2020-03-02 ENCOUNTER — Telehealth: Payer: Self-pay | Admitting: Nephrology

## 2020-03-02 NOTE — Telephone Encounter (Signed)
Transition of care contact from inpatient facility  Date of Discharge: 6/26 Date of Contact: 6/29-attempt  Method of contact: Phone  Attempted to contact patient to discuss transition of care from inpatient admission. Patient did not answer the phone. Message was left on the patient's voicemail with call back number [phone].

## 2020-03-04 DIAGNOSIS — N186 End stage renal disease: Secondary | ICD-10-CM | POA: Diagnosis not present

## 2020-03-04 DIAGNOSIS — Z992 Dependence on renal dialysis: Secondary | ICD-10-CM | POA: Diagnosis not present

## 2020-03-04 DIAGNOSIS — T8610 Unspecified complication of kidney transplant: Secondary | ICD-10-CM | POA: Diagnosis not present

## 2020-03-05 DIAGNOSIS — N2581 Secondary hyperparathyroidism of renal origin: Secondary | ICD-10-CM | POA: Diagnosis not present

## 2020-03-05 DIAGNOSIS — D631 Anemia in chronic kidney disease: Secondary | ICD-10-CM | POA: Diagnosis not present

## 2020-03-05 DIAGNOSIS — Z992 Dependence on renal dialysis: Secondary | ICD-10-CM | POA: Diagnosis not present

## 2020-03-05 DIAGNOSIS — E8770 Fluid overload, unspecified: Secondary | ICD-10-CM | POA: Diagnosis not present

## 2020-03-05 DIAGNOSIS — I120 Hypertensive chronic kidney disease with stage 5 chronic kidney disease or end stage renal disease: Secondary | ICD-10-CM | POA: Diagnosis not present

## 2020-03-05 DIAGNOSIS — D509 Iron deficiency anemia, unspecified: Secondary | ICD-10-CM | POA: Diagnosis not present

## 2020-03-05 DIAGNOSIS — N186 End stage renal disease: Secondary | ICD-10-CM | POA: Diagnosis not present

## 2020-03-05 DIAGNOSIS — E1129 Type 2 diabetes mellitus with other diabetic kidney complication: Secondary | ICD-10-CM | POA: Diagnosis not present

## 2020-03-24 DIAGNOSIS — E1129 Type 2 diabetes mellitus with other diabetic kidney complication: Secondary | ICD-10-CM | POA: Diagnosis not present

## 2020-03-24 DIAGNOSIS — Z5181 Encounter for therapeutic drug level monitoring: Secondary | ICD-10-CM | POA: Diagnosis not present

## 2020-04-04 DIAGNOSIS — N186 End stage renal disease: Secondary | ICD-10-CM | POA: Diagnosis not present

## 2020-04-04 DIAGNOSIS — Z992 Dependence on renal dialysis: Secondary | ICD-10-CM | POA: Diagnosis not present

## 2020-04-04 DIAGNOSIS — T8610 Unspecified complication of kidney transplant: Secondary | ICD-10-CM | POA: Diagnosis not present

## 2020-04-07 DIAGNOSIS — D631 Anemia in chronic kidney disease: Secondary | ICD-10-CM | POA: Diagnosis not present

## 2020-04-07 DIAGNOSIS — N186 End stage renal disease: Secondary | ICD-10-CM | POA: Diagnosis not present

## 2020-04-07 DIAGNOSIS — E1129 Type 2 diabetes mellitus with other diabetic kidney complication: Secondary | ICD-10-CM | POA: Diagnosis not present

## 2020-04-07 DIAGNOSIS — Z992 Dependence on renal dialysis: Secondary | ICD-10-CM | POA: Diagnosis not present

## 2020-04-07 DIAGNOSIS — N2581 Secondary hyperparathyroidism of renal origin: Secondary | ICD-10-CM | POA: Diagnosis not present

## 2020-04-07 DIAGNOSIS — D509 Iron deficiency anemia, unspecified: Secondary | ICD-10-CM | POA: Diagnosis not present

## 2020-04-23 DIAGNOSIS — Z5181 Encounter for therapeutic drug level monitoring: Secondary | ICD-10-CM | POA: Diagnosis not present

## 2020-05-05 DIAGNOSIS — D509 Iron deficiency anemia, unspecified: Secondary | ICD-10-CM | POA: Diagnosis not present

## 2020-05-05 DIAGNOSIS — D631 Anemia in chronic kidney disease: Secondary | ICD-10-CM | POA: Diagnosis not present

## 2020-05-05 DIAGNOSIS — N186 End stage renal disease: Secondary | ICD-10-CM | POA: Diagnosis not present

## 2020-05-05 DIAGNOSIS — T8610 Unspecified complication of kidney transplant: Secondary | ICD-10-CM | POA: Diagnosis not present

## 2020-05-05 DIAGNOSIS — E1129 Type 2 diabetes mellitus with other diabetic kidney complication: Secondary | ICD-10-CM | POA: Diagnosis not present

## 2020-05-05 DIAGNOSIS — Z992 Dependence on renal dialysis: Secondary | ICD-10-CM | POA: Diagnosis not present

## 2020-05-05 DIAGNOSIS — R110 Nausea: Secondary | ICD-10-CM | POA: Diagnosis not present

## 2020-05-05 DIAGNOSIS — N2581 Secondary hyperparathyroidism of renal origin: Secondary | ICD-10-CM | POA: Diagnosis not present

## 2020-05-14 ENCOUNTER — Other Ambulatory Visit: Payer: Self-pay

## 2020-05-14 ENCOUNTER — Emergency Department (HOSPITAL_COMMUNITY)
Admission: EM | Admit: 2020-05-14 | Discharge: 2020-05-14 | Disposition: A | Discharge status: Home / Self Care | Payer: Medicare Other | Attending: Emergency Medicine | Admitting: Emergency Medicine

## 2020-05-14 ENCOUNTER — Encounter (HOSPITAL_COMMUNITY): Payer: Self-pay | Admitting: Emergency Medicine

## 2020-05-14 DIAGNOSIS — F17210 Nicotine dependence, cigarettes, uncomplicated: Secondary | ICD-10-CM | POA: Insufficient documentation

## 2020-05-14 DIAGNOSIS — I120 Hypertensive chronic kidney disease with stage 5 chronic kidney disease or end stage renal disease: Secondary | ICD-10-CM | POA: Insufficient documentation

## 2020-05-14 DIAGNOSIS — E875 Hyperkalemia: Secondary | ICD-10-CM | POA: Diagnosis not present

## 2020-05-14 DIAGNOSIS — R519 Headache, unspecified: Secondary | ICD-10-CM | POA: Insufficient documentation

## 2020-05-14 DIAGNOSIS — R42 Dizziness and giddiness: Secondary | ICD-10-CM | POA: Diagnosis not present

## 2020-05-14 DIAGNOSIS — Z79899 Other long term (current) drug therapy: Secondary | ICD-10-CM | POA: Insufficient documentation

## 2020-05-14 DIAGNOSIS — N189 Chronic kidney disease, unspecified: Secondary | ICD-10-CM | POA: Diagnosis not present

## 2020-05-14 DIAGNOSIS — Z7982 Long term (current) use of aspirin: Secondary | ICD-10-CM | POA: Diagnosis not present

## 2020-05-14 DIAGNOSIS — Z20822 Contact with and (suspected) exposure to covid-19: Secondary | ICD-10-CM | POA: Insufficient documentation

## 2020-05-14 DIAGNOSIS — R531 Weakness: Secondary | ICD-10-CM | POA: Diagnosis present

## 2020-05-14 DIAGNOSIS — R52 Pain, unspecified: Secondary | ICD-10-CM | POA: Diagnosis not present

## 2020-05-14 DIAGNOSIS — E109 Type 1 diabetes mellitus without complications: Secondary | ICD-10-CM | POA: Diagnosis not present

## 2020-05-14 DIAGNOSIS — R410 Disorientation, unspecified: Secondary | ICD-10-CM | POA: Diagnosis not present

## 2020-05-14 DIAGNOSIS — I10 Essential (primary) hypertension: Secondary | ICD-10-CM | POA: Diagnosis not present

## 2020-05-14 DIAGNOSIS — D631 Anemia in chronic kidney disease: Secondary | ICD-10-CM

## 2020-05-14 DIAGNOSIS — R0902 Hypoxemia: Secondary | ICD-10-CM | POA: Diagnosis not present

## 2020-05-14 DIAGNOSIS — N185 Chronic kidney disease, stage 5: Secondary | ICD-10-CM | POA: Diagnosis not present

## 2020-05-14 DIAGNOSIS — R1084 Generalized abdominal pain: Secondary | ICD-10-CM | POA: Diagnosis not present

## 2020-05-14 LAB — CBC
HCT: 33.6 % — ABNORMAL LOW (ref 36.0–46.0)
Hemoglobin: 11 g/dL — ABNORMAL LOW (ref 12.0–15.0)
MCH: 30.5 pg (ref 26.0–34.0)
MCHC: 32.7 g/dL (ref 30.0–36.0)
MCV: 93.1 fL (ref 80.0–100.0)
Platelets: 152 10*3/uL (ref 150–400)
RBC: 3.61 MIL/uL — ABNORMAL LOW (ref 3.87–5.11)
RDW: 11.3 % — ABNORMAL LOW (ref 11.5–15.5)
WBC: 4.3 10*3/uL (ref 4.0–10.5)
nRBC: 0 % (ref 0.0–0.2)

## 2020-05-14 LAB — BASIC METABOLIC PANEL
Anion gap: 21 — ABNORMAL HIGH (ref 5–15)
BUN: 112 mg/dL — ABNORMAL HIGH (ref 6–20)
CO2: 23 mmol/L (ref 22–32)
Calcium: 9 mg/dL (ref 8.9–10.3)
Chloride: 96 mmol/L — ABNORMAL LOW (ref 98–111)
Creatinine, Ser: 16.21 mg/dL — ABNORMAL HIGH (ref 0.44–1.00)
GFR calc Af Amer: 3 mL/min — ABNORMAL LOW (ref >60)
GFR calc non Af Amer: 2 mL/min — ABNORMAL LOW (ref >60)
Glucose, Bld: 103 mg/dL — ABNORMAL HIGH (ref 70–99)
Potassium: 7.2 mmol/L (ref 3.5–5.1)
Sodium: 140 mmol/L (ref 135–145)

## 2020-05-14 LAB — I-STAT BETA HCG BLOOD, ED (MC, WL, AP ONLY): I-stat hCG, quantitative: 15.6 m[IU]/mL — ABNORMAL HIGH (ref <5)

## 2020-05-14 LAB — CBG MONITORING, ED
Glucose-Capillary: 119 mg/dL — ABNORMAL HIGH (ref 70–99)
Glucose-Capillary: 121 mg/dL — ABNORMAL HIGH (ref 70–99)

## 2020-05-14 LAB — SARS CORONAVIRUS 2 BY RT PCR (HOSPITAL ORDER, PERFORMED IN ~~LOC~~ HOSPITAL LAB): SARS Coronavirus 2: NEGATIVE

## 2020-05-14 LAB — MAGNESIUM: Magnesium: 3.3 mg/dL — ABNORMAL HIGH (ref 1.7–2.4)

## 2020-05-14 MED ORDER — LIDOCAINE-PRILOCAINE 2.5-2.5 % EX CREA: 1.0000 "application " | TOPICAL_CREAM | CUTANEOUS | Status: DC | PRN

## 2020-05-14 MED ORDER — HEPARIN SODIUM (PORCINE) 1000 UNIT/ML DIALYSIS
20.0000 [IU]/kg | INTRAMUSCULAR | Status: DC | PRN
  Filled 2020-05-14: qty 2

## 2020-05-14 MED ORDER — PENTAFLUOROPROP-TETRAFLUOROETH EX AERO: 1.0000 "application " | INHALATION_SPRAY | CUTANEOUS | Status: DC | PRN

## 2020-05-14 MED ORDER — SODIUM BICARBONATE 8.4 % IV SOLN
50.0000 meq | Freq: Once | INTRAVENOUS | Status: AC
  Administered 2020-05-14: 50 meq via INTRAVENOUS
  Filled 2020-05-14: qty 50

## 2020-05-14 MED ORDER — DEXTROSE 50 % IV SOLN
1.0000 | Freq: Once | INTRAVENOUS | Status: AC
  Administered 2020-05-14: 50 mL via INTRAVENOUS
  Filled 2020-05-14: qty 50

## 2020-05-14 MED ORDER — HEPARIN SODIUM (PORCINE) 1000 UNIT/ML DIALYSIS: 1000.0000 [IU] | INTRAMUSCULAR | Status: DC | PRN

## 2020-05-14 MED ORDER — LIDOCAINE HCL (PF) 1 % IJ SOLN: 5.0000 mL | INTRAMUSCULAR | Status: DC | PRN

## 2020-05-14 MED ORDER — ALBUTEROL SULFATE (2.5 MG/3ML) 0.083% IN NEBU
10.0000 mg | INHALATION_SOLUTION | Freq: Once | RESPIRATORY_TRACT | Status: AC
  Administered 2020-05-14: 10 mg via RESPIRATORY_TRACT
  Filled 2020-05-14: qty 12

## 2020-05-14 MED ORDER — INSULIN ASPART 100 UNIT/ML IV SOLN
10.0000 [IU] | Freq: Once | INTRAVENOUS | Status: AC
  Administered 2020-05-14: 10 [IU] via INTRAVENOUS

## 2020-05-14 MED ORDER — CHLORHEXIDINE GLUCONATE CLOTH 2 % EX PADS: 6.0000 | MEDICATED_PAD | Freq: Every day | CUTANEOUS | Status: DC

## 2020-05-14 MED ORDER — ACETAMINOPHEN 325 MG PO TABS
650.0000 mg | ORAL_TABLET | Freq: Once | ORAL | Status: AC
  Administered 2020-05-14: 650 mg via ORAL
  Filled 2020-05-14: qty 2

## 2020-05-14 MED ORDER — ALTEPLASE 2 MG IJ SOLR: 2.0000 mg | Freq: Once | INTRAMUSCULAR | Status: DC | PRN

## 2020-05-14 MED ORDER — SODIUM CHLORIDE 0.9 % IV SOLN: 100.0000 mL | INTRAVENOUS | Status: DC | PRN

## 2020-05-14 MED ORDER — CALCIUM GLUCONATE 10 % IV SOLN
1.0000 g | Freq: Once | INTRAVENOUS | Status: AC
  Administered 2020-05-14: 1 g via INTRAVENOUS
  Filled 2020-05-14: qty 10

## 2020-05-14 MED ORDER — PROMETHAZINE HCL 25 MG/ML IJ SOLN
25.0000 mg | Freq: Once | INTRAMUSCULAR | Status: AC
  Administered 2020-05-14: 25 mg via INTRAVENOUS
  Filled 2020-05-14: qty 1

## 2020-05-14 NOTE — ED Notes (Signed)
Lab states they will add on magnesium to blood work

## 2020-05-14 NOTE — ED Provider Notes (Signed)
[facility] EMERGENCY DEPARTMENT Provider Note   CSN: [id number] Arrival date & time: 05/14/20  0215   History Chief Complaint  Patient presents with  . Weakness    [name] [name] is a 39 y.o. female.  The history is provided by the patient.  Weakness She has history of hypertension, renal transplant, end-stage renal disease on hemodialysis and comes in because of feeling generally weak and also complaining of a headache.  She states that she woke up at about midnight and felt generally weak.  She denies any chest pain.  There is been no nausea or vomiting.  She gets dialysis Monday-Wednesday-Friday and missed her dialysis session 2 days ago.  Of note, she has not been vaccinated against COVID-19.  Past Medical History:  Diagnosis Date  . Anxiety   . Gastroparesis   . Narcotic abuse ([place])   . Non compliance with medical treatment   . Renal disorder   . Vision loss     Patient Active Problem List   Diagnosis Date Noted  . Symptomatic anemia 02/27/2020  . Aortic atherosclerosis ([place]) 12/27/2018  . Acute respiratory failure with hypoxia ([place]) 12/22/2018  . Hyperkalemia   . Bilateral pneumonia 12/21/2018  . Pancreas transplant status ([place]) 12/21/2018  . Immunosuppression ([place]) 12/21/2018  . Chest pain 12/21/2018  . CAP (community acquired pneumonia) 12/20/2018  . End stage renal disease ([place]) 02/06/2012  . Pleural effusion, bilateral 11/16/2011  . Anemia in chronic kidney disease (CKD) 11/16/2011  . Pericardial effusion 11/15/2011  . Fluid overload 11/15/2011  . Abdominal pain, epigastric 11/15/2011  . CKD (chronic kidney disease) stage 5, GFR less than 15 ml/min (HCC) 11/03/2011  . Hypertension 10/27/2011  . Renal failure, chronic 10/27/2011  . MEDIAL EPICONDYLITIS, LEFT 12/17/2007  . ABSCESS, AXILLA, LEFT 12/03/2007  . DIABETES MELLITUS, TYPE I, UNCONTROLLED 11/05/2007    Past Surgical History:  Procedure Laterality Date  . AV FISTULA  PLACEMENT  11/17/2011   Procedure: ARTERIOVENOUS (AV) FISTULA CREATION;  Surgeon: Rosetta Posner, MD;  Location: [facility];  Service: Vascular;  Laterality: Right;  . EYE SURGERY    . KIDNEY TRANSPLANT    . NEPHRECTOMY TRANSPLANTED ORGAN    . pancrease transplant    . REFRACTIVE SURGERY       OB History   No obstetric history on file.     Family History  Problem Relation Age of Onset  . Hypertension Father     Social History   Tobacco Use  . Smoking status: Current Some Day Smoker    Years: 1.00    Types: Pipe  . Smokeless tobacco: Never Used  . Tobacco comment: Hooka  Vaping Use  . Vaping Use: Never used  Substance Use Topics  . Alcohol use: No  . Drug use: No    Home Medications Prior to Admission medications   Medication Sig Start Date End Date Taking? Authorizing Provider  acetaminophen (TYLENOL) 500 MG tablet Take 1,000 mg by mouth every 6 (six) hours as needed for mild pain.    [provider]  amLODipine (NORVASC) 10 MG tablet Take 10 mg by mouth daily. 09/19/18   [provider]  aspirin EC 81 MG tablet Take 81 mg by mouth every morning.    [provider]  cycloSPORINE modified (NEORAL) 100 MG capsule Take 200 mg by mouth 2 (two) times daily.     [provider]  furosemide (LASIX) 20 MG tablet Take 2 tablets by mouth every other day.  09/12/19   [provider]  labetalol (NORMODYNE) 200 MG tablet Take 200 mg by mouth 2 (two) times daily. 09/12/19   [provider]  mycophenolate (MYFORTIC) 180 MG EC tablet Take 540 mg by mouth 2 (two) times daily.    [provider]  predniSONE (DELTASONE) 5 MG tablet Take 5 mg by mouth daily.  06/24/15   [provider]  promethazine (PHENERGAN) 25 MG suppository Place 1 suppository (25 mg total) rectally every 6 (six) hours as needed for nausea or vomiting. Patient not taking: Reported on 02/27/2020 02/22/14   Evelina Bucy, MD  zolpidem (AMBIEN) 10 MG tablet Take 10  mg by mouth at bedtime.    [provider]    Allergies    Fish-derived products, Reglan [metoclopramide], and Shrimp [shellfish allergy]  Review of Systems   Review of Systems  Neurological: Positive for weakness.  All other systems reviewed and are negative.   Physical Exam Updated Vital Signs BP (!) 180/76 (BP Location: Right Arm)   Pulse 67   Temp 98.1 F (36.7 C)   Resp 19   SpO2 100%   Physical Exam Vitals and nursing note reviewed.   39 year old female, resting comfortably and in no acute distress. Vital signs are significant for elevated blood pressure. Oxygen saturation is 100%, which is normal. Head is normocephalic and atraumatic. PERRLA, EOMI. Oropharynx is clear. Neck is nontender and supple without adenopathy or JVD. Back is nontender and there is no CVA tenderness. Lungs are clear without rales, wheezes, or rhonchi. Chest is nontender. Heart has regular rate and rhythm without murmur. Abdomen is soft, flat, nontender without masses or hepatosplenomegaly and peristalsis is normoactive. Extremities have no cyanosis or edema, full range of motion is present.  AV fistula is present in the right upper arm with thrill present. Skin is warm and dry without rash. Neurologic: Mental status is normal, cranial nerves are intact, there are no motor or sensory deficits.  ED Results / Procedures / Treatments   Labs (all labs ordered are listed, but only abnormal results are displayed) Labs Reviewed  BASIC METABOLIC PANEL - Abnormal; Notable for the following components:      Result Value   Potassium 7.2 (*)    Chloride 96 (*)    Glucose, Bld 103 (*)    BUN 112 (*)    Creatinine, Ser 16.21 (*)    GFR calc non Af Amer 2 (*)    GFR calc Af Amer 3 (*)    Anion gap 21 (*)    All other components within normal limits  CBC - Abnormal; Notable for the following components:   RBC 3.61 (*)    Hemoglobin 11.0 (*)    HCT 33.6 (*)    RDW 11.3 (*)    All other  components within normal limits  CBG MONITORING, ED - Abnormal; Notable for the following components:   Glucose-Capillary 119 (*)    All other components within normal limits  I-STAT BETA HCG BLOOD, ED ([facility], [facility], [facility] ONLY) - Abnormal; Notable for the following components:   I-stat hCG, quantitative 15.6 (*)    All other components within normal limits  SARS CORONAVIRUS 2 BY RT PCR ([facility])  URINALYSIS, ROUTINE W REFLEX MICROSCOPIC  MAGNESIUM    EKG Normal sinus rhythm 73 bpm.  Normal axis.  Normal intervals.  Nonspecific ST changes.  Tall T waves V2-V4 concerning for hyperkalemia.  When compared with ECG  of February 27, 2020, T wave amplitude in anterior leads has increased.  Procedures Procedures  CRITICAL CARE Performed by: Delora Fuel Total critical care time: 60 minutes Critical care time was exclusive of separately billable procedures and treating other patients. Critical care was necessary to treat or prevent imminent or life-threatening deterioration. Critical care was time spent personally by me on the following activities: development of treatment plan with patient and/or surrogate as well as nursing, discussions with consultants, evaluation of patient's response to treatment, examination of patient, obtaining history from patient or surrogate, ordering and performing treatments and interventions, ordering and review of laboratory studies, ordering and review of radiographic studies, pulse oximetry and re-evaluation of patient's condition.  Medications Ordered in ED Medications  Chlorhexidine Gluconate Cloth 2 % PADS 6 each (has no administration in time range)  pentafluoroprop-tetrafluoroeth (GEBAUERS) aerosol 1 application (has no administration in time range)  lidocaine (PF) (XYLOCAINE) 1 % injection 5 mL (has no administration in time range)  lidocaine-prilocaine (EMLA) cream 1 application (has no administration in time range)  0.9 %   sodium chloride infusion (has no administration in time range)  0.9 %  sodium chloride infusion (has no administration in time range)  heparin injection 1,000 Units (has no administration in time range)  alteplase (CATHFLO ACTIVASE) injection 2 mg (has no administration in time range)  heparin injection 20 Units/kg (has no administration in time range)  calcium gluconate inj 10% (1 g) URGENT USE ONLY! (1 g Intravenous Given 05/14/20 0619)  albuterol (PROVENTIL) (2.5 MG/3ML) 0.083% nebulizer solution 10 mg (10 mg Nebulization Given 05/14/20 0635)  insulin aspart (novoLOG) injection 10 Units (10 Units Intravenous Given 05/14/20 0620)    And  dextrose 50 % solution 50 mL (50 mLs Intravenous Given 05/14/20 0619)  sodium bicarbonate injection 50 mEq (50 mEq Intravenous Given 05/14/20 0619)  acetaminophen (TYLENOL) tablet 650 mg (650 mg Oral Given 05/14/20 0620)  promethazine (PHENERGAN) injection 25 mg (25 mg Intravenous Given 05/14/20 4081)    ED Course  I have reviewed the triage vital signs and the nursing notes.  Pertinent lab results that were available during my care of the patient were reviewed by me and considered in my medical decision making (see chart for details).  MDM Rules/Calculators/A&P Headache and weakness and patient on dialysis having missed a dialysis session.  Labs obtained show severe hyperkalemia with potassium 7.2 and no visible hemolysis.  ECG does show some tall, peaked T waves anteriorly consistent with hyperkalemia.  She also has mild anemia which is actually improved over baseline.  She is given emergent treatment for hyperkalemia including calcium, serum bicarbonate, glucose, insulin, albuterol.  Old records are reviewed, and she has had emergent admissions because of her underlying kidney disease.  Currently, she does not appear volume overloaded.  Case has been discussed with Dr. Moshe Cipro, on-call for nephrology, who agrees to have the patient dialyzed  emergently.  Final Clinical Impression(s) / ED Diagnoses Final diagnoses:  Hyperkalemia  Headache, unspecified headache type  Anemia associated with chronic renal failure  Elevated blood pressure reading with diagnosis of hypertension    Rx / DC Orders ED Discharge Orders    None       Delora Fuel, MD 44/81/85 [phone]

## 2020-05-14 NOTE — Procedures (Addendum)
The patient is a 39 y.o. year-old w/ history of hypertension, renal transplant, end-stage renal disease on hemodialysis and comes in to ED because of feeling generally weak and also complaining of a headache. Hx pancreas and kidney transplant. Still on meds for pancreas, kidney has failed and back on HD. In ED K 7.2, no other findings. Asked to see for dialysis.   Pt seen on HD, doing well, no c/o , feeling better.  Will be sent back to ED after HD completed.  Exam is normal w/o resp distress or edema.    I was present at this dialysis session, have reviewed the session itself and made  appropriate changes Kelly Splinter MD [facility] pager [phone]   05/14/2020, 9:45 AM

## 2020-05-14 NOTE — ED Triage Notes (Signed)
Pt BIB [facility] from home, pt does dialysis MWF, missed her dialysis appointment on Wednesday. Pt woke up tonight feeling disoriented, generalized weakness, and c/o nausea and abdominal pain. Hx pancreas and kidney transplant. Hypertensive with EMS.

## 2020-05-14 NOTE — Discharge Instructions (Signed)
Please follow up for your next scheduled dialysis session.

## 2020-05-19 DIAGNOSIS — Z5181 Encounter for therapeutic drug level monitoring: Secondary | ICD-10-CM | POA: Diagnosis not present

## 2020-05-21 DIAGNOSIS — T7840XA Allergy, unspecified, initial encounter: Secondary | ICD-10-CM | POA: Insufficient documentation

## 2020-05-21 DIAGNOSIS — T782XXA Anaphylactic shock, unspecified, initial encounter: Secondary | ICD-10-CM | POA: Insufficient documentation

## 2020-06-04 DIAGNOSIS — D631 Anemia in chronic kidney disease: Secondary | ICD-10-CM | POA: Diagnosis not present

## 2020-06-04 DIAGNOSIS — N2581 Secondary hyperparathyroidism of renal origin: Secondary | ICD-10-CM | POA: Diagnosis not present

## 2020-06-04 DIAGNOSIS — E1129 Type 2 diabetes mellitus with other diabetic kidney complication: Secondary | ICD-10-CM | POA: Diagnosis not present

## 2020-06-04 DIAGNOSIS — T8610 Unspecified complication of kidney transplant: Secondary | ICD-10-CM | POA: Diagnosis not present

## 2020-06-04 DIAGNOSIS — N186 End stage renal disease: Secondary | ICD-10-CM | POA: Diagnosis not present

## 2020-06-04 DIAGNOSIS — Z992 Dependence on renal dialysis: Secondary | ICD-10-CM | POA: Diagnosis not present

## 2020-06-04 DIAGNOSIS — D509 Iron deficiency anemia, unspecified: Secondary | ICD-10-CM | POA: Diagnosis not present

## 2020-06-04 DIAGNOSIS — Z23 Encounter for immunization: Secondary | ICD-10-CM | POA: Diagnosis not present

## 2020-06-08 DIAGNOSIS — Z992 Dependence on renal dialysis: Secondary | ICD-10-CM | POA: Diagnosis not present

## 2020-06-08 DIAGNOSIS — N186 End stage renal disease: Secondary | ICD-10-CM | POA: Diagnosis not present

## 2020-06-08 DIAGNOSIS — T82898A Other specified complication of vascular prosthetic devices, implants and grafts, initial encounter: Secondary | ICD-10-CM | POA: Diagnosis not present

## 2020-06-23 DIAGNOSIS — E1129 Type 2 diabetes mellitus with other diabetic kidney complication: Secondary | ICD-10-CM | POA: Diagnosis not present

## 2020-06-23 DIAGNOSIS — Z5181 Encounter for therapeutic drug level monitoring: Secondary | ICD-10-CM | POA: Diagnosis not present

## 2020-07-05 DIAGNOSIS — T8610 Unspecified complication of kidney transplant: Secondary | ICD-10-CM | POA: Diagnosis not present

## 2020-07-05 DIAGNOSIS — Z992 Dependence on renal dialysis: Secondary | ICD-10-CM | POA: Diagnosis not present

## 2020-07-05 DIAGNOSIS — N186 End stage renal disease: Secondary | ICD-10-CM | POA: Diagnosis not present

## 2020-07-06 DIAGNOSIS — D631 Anemia in chronic kidney disease: Secondary | ICD-10-CM | POA: Diagnosis not present

## 2020-07-06 DIAGNOSIS — D509 Iron deficiency anemia, unspecified: Secondary | ICD-10-CM | POA: Diagnosis not present

## 2020-07-06 DIAGNOSIS — N186 End stage renal disease: Secondary | ICD-10-CM | POA: Diagnosis not present

## 2020-07-06 DIAGNOSIS — E1129 Type 2 diabetes mellitus with other diabetic kidney complication: Secondary | ICD-10-CM | POA: Diagnosis not present

## 2020-07-06 DIAGNOSIS — Z992 Dependence on renal dialysis: Secondary | ICD-10-CM | POA: Diagnosis not present

## 2020-07-06 DIAGNOSIS — N2581 Secondary hyperparathyroidism of renal origin: Secondary | ICD-10-CM | POA: Diagnosis not present

## 2020-07-21 DIAGNOSIS — Z5181 Encounter for therapeutic drug level monitoring: Secondary | ICD-10-CM | POA: Diagnosis not present

## 2020-08-02 ENCOUNTER — Emergency Department (HOSPITAL_COMMUNITY)
Admission: EM | Admit: 2020-08-02 | Discharge: 2020-08-02 | Disposition: A | Discharge status: Home / Self Care | Payer: Medicare Other | Attending: Emergency Medicine | Admitting: Emergency Medicine

## 2020-08-02 ENCOUNTER — Other Ambulatory Visit: Payer: Self-pay

## 2020-08-02 ENCOUNTER — Emergency Department (HOSPITAL_COMMUNITY): Payer: Medicare Other

## 2020-08-02 ENCOUNTER — Encounter (HOSPITAL_COMMUNITY): Payer: Self-pay

## 2020-08-02 DIAGNOSIS — Z20822 Contact with and (suspected) exposure to covid-19: Secondary | ICD-10-CM | POA: Diagnosis not present

## 2020-08-02 DIAGNOSIS — J9811 Atelectasis: Secondary | ICD-10-CM | POA: Diagnosis not present

## 2020-08-02 DIAGNOSIS — Z79899 Other long term (current) drug therapy: Secondary | ICD-10-CM | POA: Insufficient documentation

## 2020-08-02 DIAGNOSIS — R059 Cough, unspecified: Secondary | ICD-10-CM | POA: Diagnosis not present

## 2020-08-02 DIAGNOSIS — R52 Pain, unspecified: Secondary | ICD-10-CM | POA: Diagnosis not present

## 2020-08-02 DIAGNOSIS — I160 Hypertensive urgency: Secondary | ICD-10-CM | POA: Insufficient documentation

## 2020-08-02 DIAGNOSIS — Z9115 Patient's noncompliance with renal dialysis: Secondary | ICD-10-CM | POA: Diagnosis not present

## 2020-08-02 DIAGNOSIS — Z992 Dependence on renal dialysis: Secondary | ICD-10-CM | POA: Diagnosis not present

## 2020-08-02 DIAGNOSIS — J90 Pleural effusion, not elsewhere classified: Secondary | ICD-10-CM | POA: Diagnosis not present

## 2020-08-02 DIAGNOSIS — E1022 Type 1 diabetes mellitus with diabetic chronic kidney disease: Secondary | ICD-10-CM | POA: Insufficient documentation

## 2020-08-02 DIAGNOSIS — R0902 Hypoxemia: Secondary | ICD-10-CM | POA: Diagnosis not present

## 2020-08-02 DIAGNOSIS — J811 Chronic pulmonary edema: Secondary | ICD-10-CM | POA: Diagnosis not present

## 2020-08-02 DIAGNOSIS — I517 Cardiomegaly: Secondary | ICD-10-CM | POA: Diagnosis not present

## 2020-08-02 DIAGNOSIS — R1084 Generalized abdominal pain: Secondary | ICD-10-CM | POA: Diagnosis not present

## 2020-08-02 DIAGNOSIS — N186 End stage renal disease: Secondary | ICD-10-CM | POA: Insufficient documentation

## 2020-08-02 DIAGNOSIS — Z7982 Long term (current) use of aspirin: Secondary | ICD-10-CM | POA: Diagnosis not present

## 2020-08-02 DIAGNOSIS — E875 Hyperkalemia: Secondary | ICD-10-CM | POA: Diagnosis not present

## 2020-08-02 DIAGNOSIS — R6883 Chills (without fever): Secondary | ICD-10-CM | POA: Insufficient documentation

## 2020-08-02 DIAGNOSIS — I120 Hypertensive chronic kidney disease with stage 5 chronic kidney disease or end stage renal disease: Secondary | ICD-10-CM | POA: Insufficient documentation

## 2020-08-02 DIAGNOSIS — R112 Nausea with vomiting, unspecified: Secondary | ICD-10-CM

## 2020-08-02 DIAGNOSIS — R000 Tachycardia, unspecified: Secondary | ICD-10-CM | POA: Diagnosis not present

## 2020-08-02 DIAGNOSIS — F17210 Nicotine dependence, cigarettes, uncomplicated: Secondary | ICD-10-CM | POA: Insufficient documentation

## 2020-08-02 DIAGNOSIS — R42 Dizziness and giddiness: Secondary | ICD-10-CM | POA: Diagnosis not present

## 2020-08-02 LAB — BASIC METABOLIC PANEL
Anion gap: 19 — ABNORMAL HIGH (ref 5–15)
BUN: 46 mg/dL — ABNORMAL HIGH (ref 6–20)
CO2: 20 mmol/L — ABNORMAL LOW (ref 22–32)
Calcium: 8.7 mg/dL — ABNORMAL LOW (ref 8.9–10.3)
Chloride: 98 mmol/L (ref 98–111)
Creatinine, Ser: 9.27 mg/dL — ABNORMAL HIGH (ref 0.44–1.00)
GFR, Estimated: 5 mL/min — ABNORMAL LOW (ref >60)
Glucose, Bld: 74 mg/dL (ref 70–99)
Potassium: 3.8 mmol/L (ref 3.5–5.1)
Sodium: 137 mmol/L (ref 135–145)

## 2020-08-02 LAB — COMPREHENSIVE METABOLIC PANEL
ALT: 17 U/L (ref 0–44)
AST: 14 U/L — ABNORMAL LOW (ref 15–41)
Albumin: 3.9 g/dL (ref 3.5–5.0)
Alkaline Phosphatase: 68 U/L (ref 38–126)
Anion gap: 24 — ABNORMAL HIGH (ref 5–15)
BUN: 109 mg/dL — ABNORMAL HIGH (ref 6–20)
CO2: 12 mmol/L — ABNORMAL LOW (ref 22–32)
Calcium: 9.3 mg/dL (ref 8.9–10.3)
Chloride: 103 mmol/L (ref 98–111)
Creatinine, Ser: 17.1 mg/dL — ABNORMAL HIGH (ref 0.44–1.00)
GFR, Estimated: 2 mL/min — ABNORMAL LOW (ref >60)
Glucose, Bld: 81 mg/dL (ref 70–99)
Potassium: 7 mmol/L (ref 3.5–5.1)
Sodium: 139 mmol/L (ref 135–145)
Total Bilirubin: 0.9 mg/dL (ref 0.3–1.2)
Total Protein: 7.2 g/dL (ref 6.5–8.1)

## 2020-08-02 LAB — RESP PANEL BY RT-PCR (FLU A&B, COVID) ARPGX2
Influenza A by PCR: NEGATIVE
Influenza B by PCR: NEGATIVE
SARS Coronavirus 2 by RT PCR: NEGATIVE

## 2020-08-02 LAB — CBC
HCT: 40.1 % (ref 36.0–46.0)
Hemoglobin: 12.5 g/dL (ref 12.0–15.0)
MCH: 31.3 pg (ref 26.0–34.0)
MCHC: 31.2 g/dL (ref 30.0–36.0)
MCV: 100.3 fL — ABNORMAL HIGH (ref 80.0–100.0)
Platelets: 110 10*3/uL — ABNORMAL LOW (ref 150–400)
RBC: 4 MIL/uL (ref 3.87–5.11)
RDW: 12.6 % (ref 11.5–15.5)
WBC: 5.4 10*3/uL (ref 4.0–10.5)
nRBC: 0 % (ref 0.0–0.2)

## 2020-08-02 LAB — CBG MONITORING, ED
Glucose-Capillary: 46 mg/dL — ABNORMAL LOW (ref 70–99)
Glucose-Capillary: 101 mg/dL — ABNORMAL HIGH (ref 70–99)
Glucose-Capillary: 73 mg/dL (ref 70–99)

## 2020-08-02 LAB — I-STAT BETA HCG BLOOD, ED (MC, WL, AP ONLY): I-stat hCG, quantitative: <5 m[IU]/mL (ref <5)

## 2020-08-02 LAB — HCG, QUANTITATIVE, PREGNANCY: hCG, Beta Chain, Quant, S: 2 m[IU]/mL (ref <5)

## 2020-08-02 LAB — LIPASE, BLOOD: Lipase: 45 U/L (ref 11–51)

## 2020-08-02 IMAGING — DX DG CHEST 1V PORT
1 series · 1 of 1 positions shown · non-contrast
Comparison: [DATE] chest radiograph and prior.

CLINICAL DATA: Cough

EXAM:
PORTABLE CHEST 1 VIEW

[chest ap]
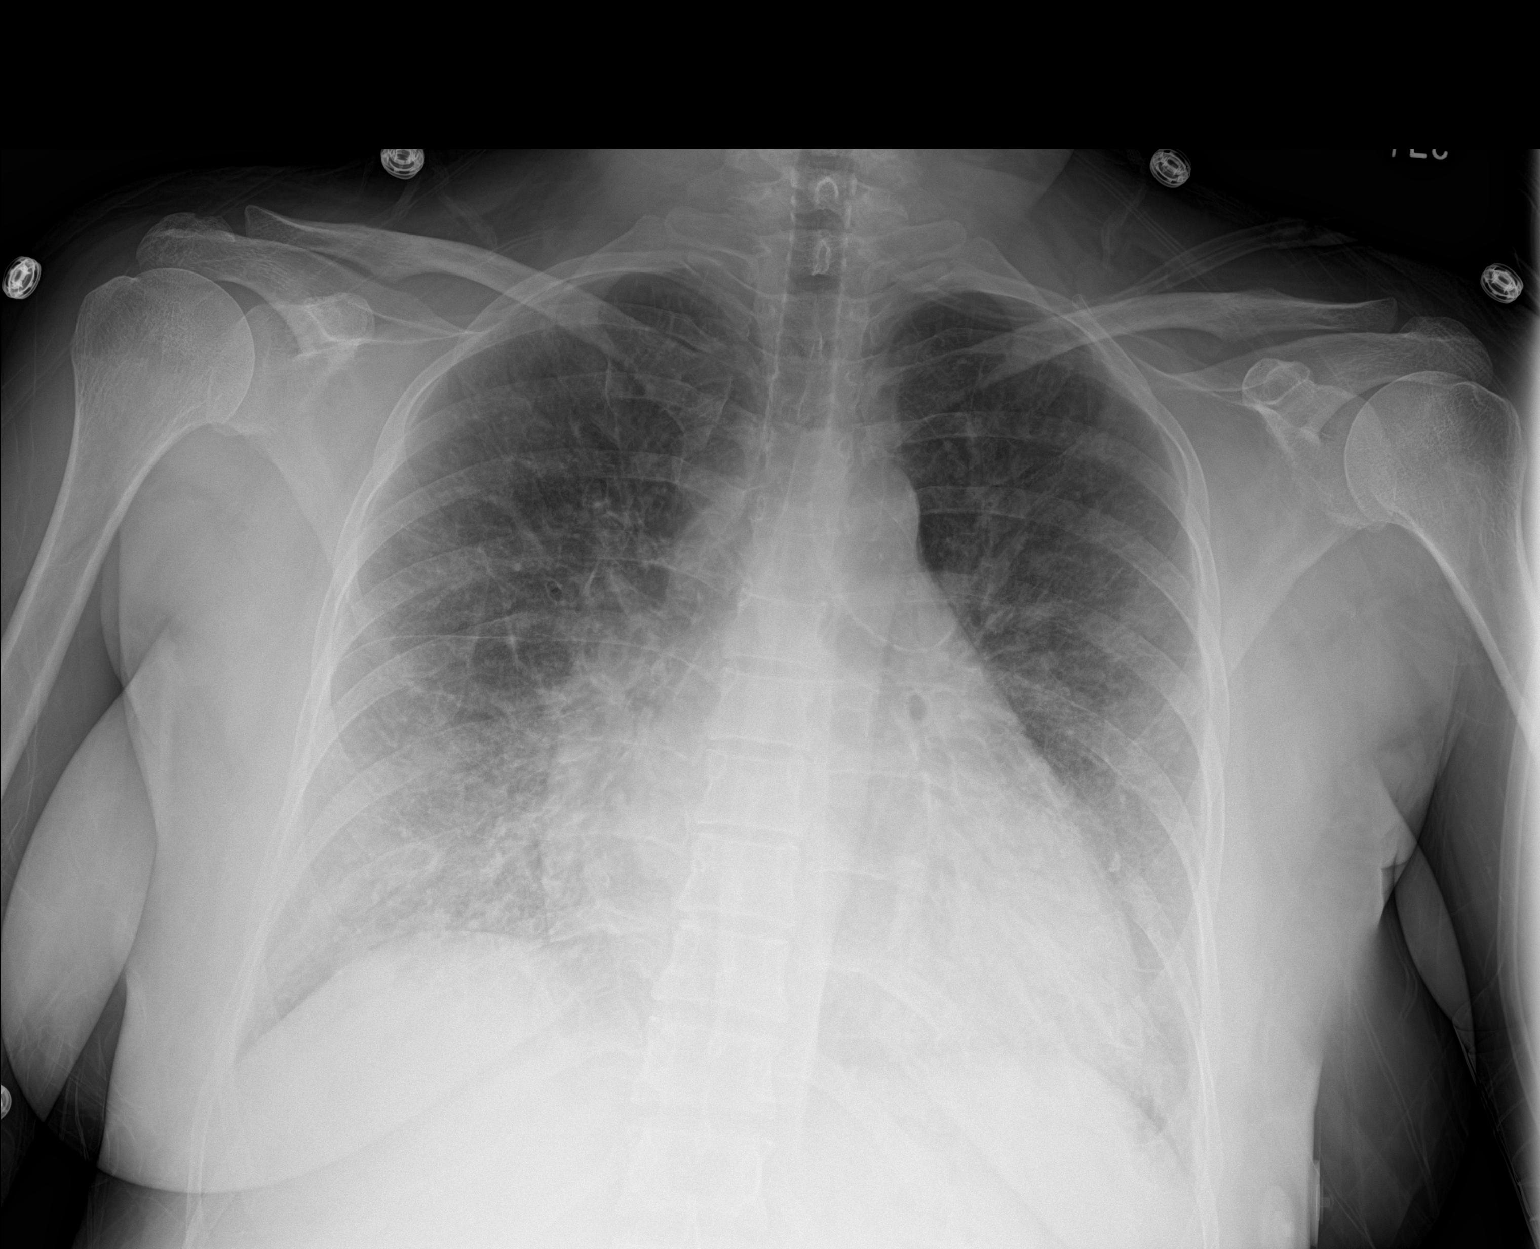

[1 of 1 positions shown; findings below may reference images not displayed]

FINDINGS: Cardiomegaly and central pulmonary vascular congestion. Patchy and
confluent perihilar/bibasilar opacities. No pneumothorax or pleural
effusion. No acute osseous abnormality.
IMPRESSION: Cardiomegaly with central pulmonary vascular congestion.

Perihilar/bibasilar opacities, atelectasis, edema or infection.

## 2020-08-02 MED ORDER — ALBUTEROL SULFATE (2.5 MG/3ML) 0.083% IN NEBU
10.0000 mg | INHALATION_SOLUTION | Freq: Once | RESPIRATORY_TRACT | Status: AC
  Administered 2020-08-02: 10 mg via RESPIRATORY_TRACT
  Filled 2020-08-02: qty 12

## 2020-08-02 MED ORDER — DEXTROSE 50 % IV SOLN
1.0000 | Freq: Once | INTRAVENOUS | Status: AC
  Administered 2020-08-02: 50 mL via INTRAVENOUS
  Filled 2020-08-02: qty 50

## 2020-08-02 MED ORDER — HYDROXYZINE HCL 25 MG PO TABS
50.0000 mg | ORAL_TABLET | Freq: Once | ORAL | Status: AC
  Administered 2020-08-02: 50 mg via ORAL
  Filled 2020-08-02: qty 2

## 2020-08-02 MED ORDER — DEXTROSE 50 % IV SOLN
50.0000 mL | Freq: Once | INTRAVENOUS | Status: AC
  Administered 2020-08-02: 50 mL via INTRAVENOUS
  Filled 2020-08-02: qty 50

## 2020-08-02 MED ORDER — ONDANSETRON HCL 4 MG/2ML IJ SOLN
4.0000 mg | Freq: Once | INTRAMUSCULAR | Status: AC
  Administered 2020-08-02: 4 mg via INTRAVENOUS
  Filled 2020-08-02: qty 2

## 2020-08-02 MED ORDER — SODIUM BICARBONATE 8.4 % IV SOLN
50.0000 meq | Freq: Once | INTRAVENOUS | Status: AC
  Administered 2020-08-02: 50 meq via INTRAVENOUS
  Filled 2020-08-02: qty 50

## 2020-08-02 MED ORDER — FENTANYL CITRATE (PF) 100 MCG/2ML IJ SOLN
25.0000 ug | Freq: Once | INTRAMUSCULAR | Status: AC
  Administered 2020-08-02: 25 ug via INTRAVENOUS
  Filled 2020-08-02: qty 2

## 2020-08-02 MED ORDER — HEPARIN SODIUM (PORCINE) 1000 UNIT/ML DIALYSIS
3000.0000 [IU] | INTRAMUSCULAR | Status: DC | PRN
  Filled 2020-08-02: qty 3

## 2020-08-02 MED ORDER — LABETALOL HCL 5 MG/ML IV SOLN
10.0000 mg | Freq: Once | INTRAVENOUS | Status: AC
  Administered 2020-08-02: 10 mg via INTRAVENOUS
  Filled 2020-08-02: qty 4

## 2020-08-02 MED ORDER — CALCIUM GLUCONATE 10 % IV SOLN
1.0000 g | Freq: Once | INTRAVENOUS | Status: AC
  Administered 2020-08-02: 1 g via INTRAVENOUS
  Filled 2020-08-02: qty 10

## 2020-08-02 MED ORDER — HYDROXYZINE HCL 25 MG PO TABS
25.0000 mg | ORAL_TABLET | Freq: Once | ORAL | Status: AC
  Administered 2020-08-02: 25 mg via ORAL
  Filled 2020-08-02: qty 1

## 2020-08-02 MED ORDER — CHLORHEXIDINE GLUCONATE CLOTH 2 % EX PADS: 6.0000 | MEDICATED_PAD | Freq: Every day | CUTANEOUS | Status: DC

## 2020-08-02 MED ORDER — HYDROCODONE-ACETAMINOPHEN 5-325 MG PO TABS
ORAL_TABLET | ORAL | Status: AC
  Filled 2020-08-02: qty 2

## 2020-08-02 MED ORDER — INSULIN ASPART 100 UNIT/ML IV SOLN
10.0000 [IU] | Freq: Once | INTRAVENOUS | Status: AC
  Administered 2020-08-02: 10 [IU] via INTRAVENOUS

## 2020-08-02 MED ORDER — HYDROCODONE-ACETAMINOPHEN 5-325 MG PO TABS
1.0000 | ORAL_TABLET | Freq: Once | ORAL | Status: AC
  Administered 2020-08-02: 2 via ORAL

## 2020-08-02 NOTE — ED Notes (Signed)
Pt given orange juice and graham crackers for CBG of 73

## 2020-08-02 NOTE — ED Notes (Signed)
Pt will not keep her leads or BP cuff on. I explained the importance of having them on. This RN re-applied everything. Pt has already removed leads

## 2020-08-02 NOTE — ED Notes (Addendum)
Pt appears to be having anxiety attack. Slight generalized tremors from head to toe, pt states feeling of "something isn't right, help me". Tech coached pt through 3 minutes of deep breathing exercises. V/S improved slightly.

## 2020-08-02 NOTE — ED Notes (Signed)
RN informed by charge RN that pt will be seen by Green PA, and that bp is normally elevated.

## 2020-08-02 NOTE — ED Provider Notes (Signed)
[facility] EMERGENCY DEPARTMENT Provider Note   CSN: [id number] Arrival date & time: 08/02/20  0745     History Chief Complaint  Patient presents with  . Emesis  . Diarrhea    [name] [name] is a 39 y.o. female.  The history is provided by the patient and medical records. No language interpreter was used.  Emesis Associated symptoms: diarrhea   Diarrhea Associated symptoms: vomiting      39 year old female significant history of end-stage renal disease currently on dialysis, who presented complaining of nausea vomiting diarrhea.  Patient states since yesterday she has had diffuse crampy abdominal pain follows with persistent nausea, copious amounts vomiting as well as multiple bouts of nonbloody nonmucousy diarrhea.  Symptoms moderate to severe, persistent, she also endorsed having chills and shivers.  She endorsed having some shortness of breath as well but without any chest pain.  She endorsed a dry cough.  She admits missing her last dialysis session because of car trouble and because she was out of town.  Last dialysis was 5 days ago.  Her nephrologist is Dr. Moshe Cipro and Dr. Posey Pronto.  Patient has not been vaccinated for COVID-19.  Denies loss of taste or smell.  No specific treatment tried at home.  Past Medical History:  Diagnosis Date  . Anxiety   . Gastroparesis   . Narcotic abuse ([place])   . Non compliance with medical treatment   . Renal disorder   . Vision loss     Patient Active Problem List   Diagnosis Date Noted  . Symptomatic anemia 02/27/2020  . Aortic atherosclerosis ([place]) 12/27/2018  . Acute respiratory failure with hypoxia ([place]) 12/22/2018  . Hyperkalemia   . Bilateral pneumonia 12/21/2018  . Pancreas transplant status ([place]) 12/21/2018  . Immunosuppression ([place]) 12/21/2018  . Chest pain 12/21/2018  . CAP (community acquired pneumonia) 12/20/2018  . End stage renal disease ([place]) 02/06/2012  . Pleural effusion, bilateral  11/16/2011  . Anemia in chronic kidney disease (CKD) 11/16/2011  . Pericardial effusion 11/15/2011  . Fluid overload 11/15/2011  . Abdominal pain, epigastric 11/15/2011  . CKD (chronic kidney disease) stage 5, GFR less than 15 ml/min (HCC) 11/03/2011  . Hypertension 10/27/2011  . Renal failure, chronic 10/27/2011  . MEDIAL EPICONDYLITIS, LEFT 12/17/2007  . ABSCESS, AXILLA, LEFT 12/03/2007  . DIABETES MELLITUS, TYPE I, UNCONTROLLED 11/05/2007    Past Surgical History:  Procedure Laterality Date  . AV FISTULA PLACEMENT  11/17/2011   Procedure: ARTERIOVENOUS (AV) FISTULA CREATION;  Surgeon: Rosetta Posner, MD;  Location: [facility];  Service: Vascular;  Laterality: Right;  . EYE SURGERY    . KIDNEY TRANSPLANT    . NEPHRECTOMY TRANSPLANTED ORGAN    . pancrease transplant    . REFRACTIVE SURGERY       OB History   No obstetric history on file.     Family History  Problem Relation Age of Onset  . Hypertension Father     Social History   Tobacco Use  . Smoking status: Current Some Day Smoker    Years: 1.00    Types: Pipe  . Smokeless tobacco: Never Used  . Tobacco comment: Hooka  Vaping Use  . Vaping Use: Never used  Substance Use Topics  . Alcohol use: No  . Drug use: No    Home Medications Prior to Admission medications   Medication Sig Start Date End Date Taking? Authorizing Provider  acetaminophen (TYLENOL) 500 MG tablet Take 1,000 mg by mouth every 6 (  six) hours as needed for mild pain.    [provider]  amLODipine (NORVASC) 10 MG tablet Take 10 mg by mouth daily. 09/19/18   [provider]  aspirin EC 81 MG tablet Take 81 mg by mouth every morning.    [provider]  calcium acetate (PHOSLO) 667 MG capsule Take 2,001 mg by mouth 3 (three) times daily with meals. And 1 capsule (667mg ) with a snack 05/04/20   [provider]  cycloSPORINE modified (NEORAL) 100 MG capsule Take 200 mg by mouth 2 (two) times daily.     [provider]  furosemide (LASIX) 20 MG tablet Take 2 tablets by mouth every other day. 09/12/19   [provider]  hydrOXYzine (ATARAX/VISTARIL) 50 MG tablet Take 50 mg by mouth every 8 (eight) hours as needed for anxiety. 03/04/20   [provider]  labetalol (NORMODYNE) 200 MG tablet Take 200 mg by mouth 2 (two) times daily. 09/12/19   [provider]  mycophenolate (MYFORTIC) 180 MG EC tablet Take 540 mg by mouth 2 (two) times daily.    [provider]  predniSONE (DELTASONE) 5 MG tablet Take 5 mg by mouth daily.  06/24/15   [provider]  promethazine (PHENERGAN) 25 MG suppository Place 1 suppository (25 mg total) rectally every 6 (six) hours as needed for nausea or vomiting. Patient not taking: Reported on 02/27/2020 02/22/14   Evelina Bucy, MD  zolpidem (AMBIEN) 10 MG tablet Take 10 mg by mouth at bedtime.    [provider]    Allergies    Fish-derived products, Reglan [metoclopramide], and Shrimp [shellfish allergy]  Review of Systems   Review of Systems  Gastrointestinal: Positive for diarrhea and vomiting.  All other systems reviewed and are negative.   Physical Exam Updated Vital Signs BP (!) 228/94 (BP Location: Right Arm)   Pulse 93   Temp 98.8 F (37.1 C) (Oral)   Resp (!) 22   Ht 5\' 2"  (1.575 m)   Wt 62 kg   SpO2 100%   BMI 25.00 kg/m   Physical Exam Vitals and nursing note reviewed.  Constitutional:      Appearance: She is well-developed.     Comments: patient is moaning and crying appears uncomfortable but nontoxic.  HENT:     Head: Atraumatic.  Eyes:     Conjunctiva/sclera: Conjunctivae normal.  Cardiovascular:     Rate and Rhythm: Tachycardia present.     Pulses: Normal pulses.     Heart sounds: Normal heart sounds.  Pulmonary:     Breath sounds: Rales (Faint crackles heard at lower lung bases.) present.  Abdominal:     Palpations: Abdomen is soft.     Tenderness: There is abdominal tenderness  (Diffuse abdominal tenderness without guarding or rebound tenderness.).  Musculoskeletal:     Cervical back: Neck supple. No rigidity.  Skin:    Findings: No rash.  Neurological:     Mental Status: She is alert and oriented to person, place, and time.  Psychiatric:        Mood and Affect: Mood normal.     ED Results / Procedures / Treatments   Labs (all labs ordered are listed, but only abnormal results are displayed) Labs Reviewed  COMPREHENSIVE METABOLIC PANEL - Abnormal; Notable for the following components:      Result Value   Potassium 7.0 (*)    CO2 12 (*)    BUN 109 (*)    Creatinine, Ser 17.10 (*)  AST 14 (*)    GFR, Estimated 2 (*)    Anion gap 24 (*)    All other components within normal limits  CBC - Abnormal; Notable for the following components:   MCV 100.3 (*)    Platelets 110 (*)    All other components within normal limits  CBG MONITORING, ED - Abnormal; Notable for the following components:   Glucose-Capillary 46 (*)    All other components within normal limits  CBG MONITORING, ED - Abnormal; Notable for the following components:   Glucose-Capillary 101 (*)    All other components within normal limits  RESP PANEL BY RT-PCR (FLU A&B, COVID) ARPGX2  LIPASE, BLOOD  HCG, QUANTITATIVE, PREGNANCY  I-STAT BETA HCG BLOOD, ED ([facility], [facility], [facility] ONLY)    EKG EKG Interpretation  Date/Time:  Monday August 02 2020 07:52:48 EST Ventricular Rate:  101 PR Interval:  156 QRS Duration: 82 QT Interval:  360 QTC Calculation: 466 R Axis:   53 Text Interpretation: Sinus tachycardia Possible Left atrial enlargement Nonspecific ST abnormality Abnormal ECG No significant change since 05/14/2020 Confirmed by Veryl Speak [phone]) on 08/02/2020 8:19:17 AM   Radiology DG Chest Portable 1 View  Result Date: 08/02/2020 CLINICAL DATA:  Cough EXAM: PORTABLE CHEST 1 VIEW COMPARISON:  02/27/2020 chest radiograph and prior. FINDINGS: Cardiomegaly and central pulmonary vascular  congestion. Patchy and confluent perihilar/bibasilar opacities. No pneumothorax or pleural effusion. No acute osseous abnormality. IMPRESSION: Cardiomegaly with central pulmonary vascular congestion. Perihilar/bibasilar opacities, atelectasis, edema or infection. Electronically Signed   By: Primitivo Gauze M.D.   On: 08/02/2020 09:34    Procedures .Critical Care Performed by: Domenic Moras, PA-C Authorized by: Domenic Moras, PA-C   Critical care provider statement:    Critical care time (minutes):  45   Critical care was time spent personally by me on the following activities:  Discussions with consultants, evaluation of patient's response to treatment, examination of patient, ordering and performing treatments and interventions, ordering and review of laboratory studies, ordering and review of radiographic studies, pulse oximetry, re-evaluation of patient's condition, obtaining history from patient or surrogate and review of old charts   (including critical care time)  Medications Ordered in ED Medications  Chlorhexidine Gluconate Cloth 2 % PADS 6 each (has no administration in time range)  heparin injection 3,000 Units (has no administration in time range)  ondansetron (ZOFRAN) injection 4 mg (4 mg Intravenous Given 08/02/20 0853)  fentaNYL (SUBLIMAZE) injection 25 mcg (25 mcg Intravenous Given 08/02/20 0854)  calcium gluconate inj 10% (1 g) URGENT USE ONLY! (1 g Intravenous Given 08/02/20 1020)  albuterol (PROVENTIL) (2.5 MG/3ML) 0.083% nebulizer solution 10 mg (10 mg Nebulization Given 08/02/20 0955)  insulin aspart (novoLOG) injection 10 Units (10 Units Intravenous Given 08/02/20 1023)    And  dextrose 50 % solution 50 mL (50 mLs Intravenous Given 08/02/20 1014)  sodium bicarbonate injection 50 mEq (50 mEq Intravenous Given 08/02/20 1018)  labetalol (NORMODYNE) injection 10 mg (10 mg Intravenous Given 08/02/20 1024)  hydrOXYzine (ATARAX/VISTARIL) tablet 25 mg (25 mg Oral Given  08/02/20 1104)  dextrose 50 % solution 50 mL (50 mLs Intravenous Given 08/02/20 1125)    ED Course  I have reviewed the triage vital signs and the nursing notes.  Pertinent labs & imaging results that were available during my care of the patient were reviewed by me and considered in my medical decision making (see chart for details).    MDM Rules/Calculators/A&P  BP (!) 228/113 (BP Location: Right Arm)   Pulse 92   Temp 98.8 F (37.1 C) (Oral)   Resp (!) 22   Ht 5\' 2"  (1.575 m)   Wt 62 kg   SpO2 98%   BMI 25.00 kg/m   Final Clinical Impression(s) / ED Diagnoses Final diagnoses:  Hyperkalemia  Hypertensive urgency  Dialysis patient, noncompliant (HCC)  Nausea vomiting and diarrhea    Rx / DC Orders ED Discharge Orders    None     Patient here with abdominal pain nausea vomiting diarrhea since yesterday.  Concerning feature include missing dialysis recently and her last dialysis was 5 days ago.  Suspect symptom is likely secondary to missing dialysis.  She found to be hypertensive with blood pressure 228/94 likely contributed by pain.  EKG does show peaked T's.  Appreciate consultation from on-call nephrologist, Dr. Jonnie Finner, will be involved in her care.  Will obtain labs, provide appropriate treatment and will consult medicine for admission.  9:43 AM Labs remarkable for hyper kalemia with potassium of 7.0 without signs of hemolysis.  This is likely an accurate level as patient has missed her previous dialysis session.  Given EKG showing peaked T waves along with hyperkalemia, aggressive treatment for hyperkalemia was initiated which includes albuterol, sodium bicarb, insulin, glucose, and calcium.  Dialysis specialist is intimately involved in patient care, with plan for hemodialysis and patient subsequently will get discharged from the ED.  Care discussed with Dr. Stark Jock.   1:19 PM Nurse notified that CBG is 46.  This happened shortly after patient  received treatment for hyperkalemia which includes insulin and an amp of D50.  I suspect her hypoglycemic state is secondary to insulin administration, patient was given juice and crackers as well as another amp of D50.  Will recheck CBG as appropriate.  Screening Covid test negative.  2:37 PM CBG improved to 101.  Patient brought away for dialysis.  Will remove my name from the list.  Once patient return to the ED she can be discharged home with outpatient follow-up.   Domenic Moras, PA-C 08/02/20 1438    Veryl Speak, MD 08/03/20 0800

## 2020-08-02 NOTE — ED Notes (Signed)
Critical value Blood sugar, RN and PA notified

## 2020-08-02 NOTE — Discharge Instructions (Signed)
Please follow-up with your primary care provider and nephrologist regarding today's encounter for ongoing evaluation and management.  Please continue to go to your hemodialysis appointments, as directed.  Return to the ED or seek immediate medical attention should you experience any new or worsening symptoms.

## 2020-08-02 NOTE — ED Notes (Signed)
PT stating dizzy. HTN 202/86, RR28, O2 90%. Tech started 2L via [place] until  RN can be notified.   [place] applied: RR23, O2 97%

## 2020-08-02 NOTE — Procedures (Signed)
Asked to see patient for HD.  Missed Friday, here w/ gen'd weakness. BP's high in ED 230/120.  N/V in triage this am.  Gave IV labetalol 10mg  x 1. K+ 7.0, EKG okay.  On HD now. Plan on dc back to ED after HD completed.     MWF AF  3h 35min   61.5kg  2/2.25  P4  AVF  Hep 3000    - hect 4   - venofer 50 wkly  - mirc 100 last 11/15  Kelly Splinter, MD 08/02/2020, 2:24 PM

## 2020-08-02 NOTE — ED Provider Notes (Signed)
I was called to assess the patient after she was brought back to the ED from dialysis.  In short, patient is a 39 year old female with PMH of chronic undifferentiated abdominal pain, ESRD on HD MWF, type I DM, gastroparesis, and s/p kidney pancreas transplant with rejection who was in here for her severe abdominal pain with associated nausea and NB emesis.   Plan for prior provider was for hemodialysis and then discharge home for ongoing evaluation and management on outpatient basis.  On my examination, patient states that she feels fidgety and tremulous.  She denies any history of illicit drug use or alcohol use disorder.  No history of withdrawals.  She states that her whole body feels "uncomfortable".  She also reports that she has been lightheaded and dizzy for over 30 hours.  She was informed that this could be related to her missed dialysis.  When I asked if her abdominal pain feels comparable to her last episode of abdominal pain, she said that it does.  Her physical exam was largely benign.    I reevaluated patient after providing her with Atarax.  She states that she feels much improved.  Her repeat BMP shows significant improvement in her electrolytes and high anion gap metabolic acidosis.  She states that she had traveled to [place], [place] to visit a friend and her car had broken down which is why she missed her dialysis appointment.  She feels much improved after today's dialysis and would like to go home.  I strongly encouraged close outpatient follow-up.  Strict ED return precautions discussed.  Patient voices understanding and is agreeable to the plan.   Corena Herter, PA-C 08/02/20 2130    Blanchie Dessert, MD 08/02/20 2155

## 2020-08-02 NOTE — Progress Notes (Signed)
Remaining 81mins HD terminated as pt requested. Dr was informed. AMA was signed by the pt

## 2020-08-02 NOTE — ED Triage Notes (Signed)
Pt from home with ems c.o n/v/d since yesterday. Pt is a dialysis pt, last dialysis was last Wednesday, missed Friday due to being out of town. Pt vomiting in triage. Hypertensive with ems 230/120.

## 2020-08-04 DIAGNOSIS — Z992 Dependence on renal dialysis: Secondary | ICD-10-CM | POA: Diagnosis not present

## 2020-08-04 DIAGNOSIS — D509 Iron deficiency anemia, unspecified: Secondary | ICD-10-CM | POA: Diagnosis not present

## 2020-08-04 DIAGNOSIS — N186 End stage renal disease: Secondary | ICD-10-CM | POA: Diagnosis not present

## 2020-08-04 DIAGNOSIS — D631 Anemia in chronic kidney disease: Secondary | ICD-10-CM | POA: Diagnosis not present

## 2020-08-04 DIAGNOSIS — E1129 Type 2 diabetes mellitus with other diabetic kidney complication: Secondary | ICD-10-CM | POA: Diagnosis not present

## 2020-08-04 DIAGNOSIS — T8610 Unspecified complication of kidney transplant: Secondary | ICD-10-CM | POA: Diagnosis not present

## 2020-08-04 DIAGNOSIS — N2581 Secondary hyperparathyroidism of renal origin: Secondary | ICD-10-CM | POA: Diagnosis not present

## 2020-08-18 DIAGNOSIS — Z5181 Encounter for therapeutic drug level monitoring: Secondary | ICD-10-CM | POA: Diagnosis not present

## 2020-09-04 DIAGNOSIS — Z992 Dependence on renal dialysis: Secondary | ICD-10-CM | POA: Diagnosis not present

## 2020-09-04 DIAGNOSIS — N186 End stage renal disease: Secondary | ICD-10-CM | POA: Diagnosis not present

## 2020-09-04 DIAGNOSIS — T8610 Unspecified complication of kidney transplant: Secondary | ICD-10-CM | POA: Diagnosis not present

## 2020-09-08 DIAGNOSIS — E1129 Type 2 diabetes mellitus with other diabetic kidney complication: Secondary | ICD-10-CM | POA: Diagnosis not present

## 2020-09-08 DIAGNOSIS — D631 Anemia in chronic kidney disease: Secondary | ICD-10-CM | POA: Diagnosis not present

## 2020-09-08 DIAGNOSIS — Z992 Dependence on renal dialysis: Secondary | ICD-10-CM | POA: Diagnosis not present

## 2020-09-08 DIAGNOSIS — N2581 Secondary hyperparathyroidism of renal origin: Secondary | ICD-10-CM | POA: Diagnosis not present

## 2020-09-08 DIAGNOSIS — N186 End stage renal disease: Secondary | ICD-10-CM | POA: Diagnosis not present

## 2020-09-08 DIAGNOSIS — D509 Iron deficiency anemia, unspecified: Secondary | ICD-10-CM | POA: Diagnosis not present

## 2020-09-22 ENCOUNTER — Other Ambulatory Visit: Payer: Self-pay

## 2020-09-22 DIAGNOSIS — N186 End stage renal disease: Secondary | ICD-10-CM

## 2020-09-22 DIAGNOSIS — E1129 Type 2 diabetes mellitus with other diabetic kidney complication: Secondary | ICD-10-CM | POA: Diagnosis not present

## 2020-09-22 DIAGNOSIS — Z5181 Encounter for therapeutic drug level monitoring: Secondary | ICD-10-CM | POA: Diagnosis not present

## 2020-09-29 ENCOUNTER — Other Ambulatory Visit (HOSPITAL_COMMUNITY): Payer: Self-pay

## 2020-09-30 ENCOUNTER — Other Ambulatory Visit: Payer: Self-pay

## 2020-09-30 ENCOUNTER — Ambulatory Visit (HOSPITAL_COMMUNITY)
Admission: RE | Admit: 2020-09-30 | Discharge: 2020-09-30 | Disposition: A | Discharge status: Home / Self Care | Payer: Medicare Other | Source: Ambulatory Visit | Attending: Nephrology | Admitting: Nephrology

## 2020-09-30 DIAGNOSIS — N186 End stage renal disease: Secondary | ICD-10-CM | POA: Insufficient documentation

## 2020-09-30 DIAGNOSIS — D631 Anemia in chronic kidney disease: Secondary | ICD-10-CM | POA: Diagnosis not present

## 2020-09-30 LAB — PREPARE RBC (CROSSMATCH)

## 2020-09-30 MED ORDER — SODIUM CHLORIDE 0.9% IV SOLUTION: Freq: Once | INTRAVENOUS | Status: DC

## 2020-10-01 LAB — TYPE AND SCREEN
ABO/RH(D): O POS
Antibody Screen: NEGATIVE
Unit division: 0

## 2020-10-01 LAB — BPAM RBC
Blood Product Expiration Date: 202201292359
ISSUE DATE / TIME: 202201271031
Unit Type and Rh: 9500

## 2020-10-04 DIAGNOSIS — R051 Acute cough: Secondary | ICD-10-CM | POA: Diagnosis not present

## 2020-10-05 DIAGNOSIS — Z992 Dependence on renal dialysis: Secondary | ICD-10-CM | POA: Diagnosis not present

## 2020-10-05 DIAGNOSIS — T8610 Unspecified complication of kidney transplant: Secondary | ICD-10-CM | POA: Diagnosis not present

## 2020-10-05 DIAGNOSIS — N186 End stage renal disease: Secondary | ICD-10-CM | POA: Diagnosis not present

## 2020-10-06 DIAGNOSIS — N186 End stage renal disease: Secondary | ICD-10-CM | POA: Diagnosis not present

## 2020-10-06 DIAGNOSIS — N2581 Secondary hyperparathyroidism of renal origin: Secondary | ICD-10-CM | POA: Diagnosis not present

## 2020-10-06 DIAGNOSIS — E1129 Type 2 diabetes mellitus with other diabetic kidney complication: Secondary | ICD-10-CM | POA: Diagnosis not present

## 2020-10-06 DIAGNOSIS — D509 Iron deficiency anemia, unspecified: Secondary | ICD-10-CM | POA: Diagnosis not present

## 2020-10-06 DIAGNOSIS — Z992 Dependence on renal dialysis: Secondary | ICD-10-CM | POA: Diagnosis not present

## 2020-10-06 DIAGNOSIS — D631 Anemia in chronic kidney disease: Secondary | ICD-10-CM | POA: Diagnosis not present

## 2020-10-11 ENCOUNTER — Encounter (HOSPITAL_COMMUNITY): Payer: Medicare Other

## 2020-10-12 ENCOUNTER — Ambulatory Visit: Payer: Medicare Other | Admitting: Vascular Surgery

## 2020-10-12 ENCOUNTER — Encounter (HOSPITAL_COMMUNITY): Payer: Medicare Other

## 2020-10-20 DIAGNOSIS — Z5181 Encounter for therapeutic drug level monitoring: Secondary | ICD-10-CM | POA: Diagnosis not present

## 2020-11-02 DIAGNOSIS — N186 End stage renal disease: Secondary | ICD-10-CM | POA: Diagnosis not present

## 2020-11-02 DIAGNOSIS — Z992 Dependence on renal dialysis: Secondary | ICD-10-CM | POA: Diagnosis not present

## 2020-11-02 DIAGNOSIS — T8610 Unspecified complication of kidney transplant: Secondary | ICD-10-CM | POA: Diagnosis not present

## 2020-11-03 DIAGNOSIS — D509 Iron deficiency anemia, unspecified: Secondary | ICD-10-CM | POA: Diagnosis not present

## 2020-11-03 DIAGNOSIS — D631 Anemia in chronic kidney disease: Secondary | ICD-10-CM | POA: Diagnosis not present

## 2020-11-03 DIAGNOSIS — N186 End stage renal disease: Secondary | ICD-10-CM | POA: Diagnosis not present

## 2020-11-03 DIAGNOSIS — Z992 Dependence on renal dialysis: Secondary | ICD-10-CM | POA: Diagnosis not present

## 2020-11-03 DIAGNOSIS — M109 Gout, unspecified: Secondary | ICD-10-CM | POA: Diagnosis not present

## 2020-11-03 DIAGNOSIS — E1129 Type 2 diabetes mellitus with other diabetic kidney complication: Secondary | ICD-10-CM | POA: Diagnosis not present

## 2020-11-03 DIAGNOSIS — N2581 Secondary hyperparathyroidism of renal origin: Secondary | ICD-10-CM | POA: Diagnosis not present

## 2020-11-10 DIAGNOSIS — R110 Nausea: Secondary | ICD-10-CM | POA: Diagnosis not present

## 2020-11-10 DIAGNOSIS — I10 Essential (primary) hypertension: Secondary | ICD-10-CM | POA: Diagnosis not present

## 2020-11-10 DIAGNOSIS — R0989 Other specified symptoms and signs involving the circulatory and respiratory systems: Secondary | ICD-10-CM | POA: Diagnosis not present

## 2020-11-10 DIAGNOSIS — F17210 Nicotine dependence, cigarettes, uncomplicated: Secondary | ICD-10-CM | POA: Diagnosis not present

## 2020-11-10 DIAGNOSIS — J90 Pleural effusion, not elsewhere classified: Secondary | ICD-10-CM | POA: Diagnosis not present

## 2020-11-10 DIAGNOSIS — D259 Leiomyoma of uterus, unspecified: Secondary | ICD-10-CM | POA: Diagnosis not present

## 2020-11-10 DIAGNOSIS — R1084 Generalized abdominal pain: Secondary | ICD-10-CM | POA: Diagnosis not present

## 2020-11-10 DIAGNOSIS — R1013 Epigastric pain: Secondary | ICD-10-CM | POA: Diagnosis not present

## 2020-11-10 DIAGNOSIS — J810 Acute pulmonary edema: Secondary | ICD-10-CM | POA: Diagnosis not present

## 2020-11-10 DIAGNOSIS — I454 Nonspecific intraventricular block: Secondary | ICD-10-CM | POA: Diagnosis not present

## 2020-11-10 DIAGNOSIS — M542 Cervicalgia: Secondary | ICD-10-CM | POA: Diagnosis not present

## 2020-11-10 DIAGNOSIS — R0902 Hypoxemia: Secondary | ICD-10-CM | POA: Diagnosis not present

## 2020-11-10 DIAGNOSIS — J811 Chronic pulmonary edema: Secondary | ICD-10-CM | POA: Diagnosis not present

## 2020-11-10 DIAGNOSIS — R0602 Shortness of breath: Secondary | ICD-10-CM | POA: Diagnosis not present

## 2020-11-10 DIAGNOSIS — R162 Hepatomegaly with splenomegaly, not elsewhere classified: Secondary | ICD-10-CM | POA: Diagnosis not present

## 2020-11-11 DIAGNOSIS — T8612 Kidney transplant failure: Secondary | ICD-10-CM | POA: Diagnosis not present

## 2020-11-11 DIAGNOSIS — E119 Type 2 diabetes mellitus without complications: Secondary | ICD-10-CM | POA: Diagnosis not present

## 2020-11-11 DIAGNOSIS — I120 Hypertensive chronic kidney disease with stage 5 chronic kidney disease or end stage renal disease: Secondary | ICD-10-CM | POA: Diagnosis not present

## 2020-11-11 DIAGNOSIS — R9431 Abnormal electrocardiogram [ECG] [EKG]: Secondary | ICD-10-CM | POA: Diagnosis not present

## 2020-11-11 DIAGNOSIS — Z992 Dependence on renal dialysis: Secondary | ICD-10-CM | POA: Diagnosis not present

## 2020-11-11 DIAGNOSIS — E8770 Fluid overload, unspecified: Secondary | ICD-10-CM | POA: Diagnosis not present

## 2020-11-11 DIAGNOSIS — N186 End stage renal disease: Secondary | ICD-10-CM | POA: Diagnosis not present

## 2020-11-17 DIAGNOSIS — Z5181 Encounter for therapeutic drug level monitoring: Secondary | ICD-10-CM | POA: Diagnosis not present

## 2020-11-17 DIAGNOSIS — I10 Essential (primary) hypertension: Secondary | ICD-10-CM | POA: Diagnosis not present

## 2020-11-17 DIAGNOSIS — G4489 Other headache syndrome: Secondary | ICD-10-CM | POA: Diagnosis not present

## 2020-11-17 DIAGNOSIS — I517 Cardiomegaly: Secondary | ICD-10-CM | POA: Diagnosis not present

## 2020-11-17 DIAGNOSIS — F17210 Nicotine dependence, cigarettes, uncomplicated: Secondary | ICD-10-CM | POA: Diagnosis not present

## 2020-11-17 DIAGNOSIS — Z5320 Procedure and treatment not carried out because of patient's decision for unspecified reasons: Secondary | ICD-10-CM | POA: Diagnosis not present

## 2020-11-17 DIAGNOSIS — R1084 Generalized abdominal pain: Secondary | ICD-10-CM | POA: Diagnosis not present

## 2020-11-17 DIAGNOSIS — R0902 Hypoxemia: Secondary | ICD-10-CM | POA: Diagnosis not present

## 2020-11-17 DIAGNOSIS — J9811 Atelectasis: Secondary | ICD-10-CM | POA: Diagnosis not present

## 2020-11-17 DIAGNOSIS — R0602 Shortness of breath: Secondary | ICD-10-CM | POA: Diagnosis not present

## 2020-11-30 ENCOUNTER — Ambulatory Visit (HOSPITAL_COMMUNITY)
Admission: RE | Admit: 2020-11-30 | Discharge: 2020-11-30 | Disposition: A | Discharge status: Home / Self Care | Payer: Medicare Other | Source: Ambulatory Visit | Attending: Vascular Surgery | Admitting: Vascular Surgery

## 2020-11-30 ENCOUNTER — Other Ambulatory Visit: Payer: Self-pay

## 2020-11-30 ENCOUNTER — Ambulatory Visit (INDEPENDENT_AMBULATORY_CARE_PROVIDER_SITE_OTHER): Payer: Medicare Other | Admitting: Vascular Surgery

## 2020-11-30 VITALS — BP 198/90 | HR 77 | Temp 98.0°F | Resp 16 | Ht 62.0 in | Wt 129.0 lb

## 2020-11-30 DIAGNOSIS — N186 End stage renal disease: Secondary | ICD-10-CM | POA: Diagnosis not present

## 2020-11-30 NOTE — Progress Notes (Signed)
Patient name: [name] [name] MRN: [id number] DOB: 07-13-1981 Sex: female  REASON FOR CONSULT: Evaluate right arm AV fistula, difficulty with access  HPI: Rufina Naseer Deines is a 40 y.o. female, with history of end-stage renal disease on dialysis Monday Wednesday Friday that presents for evaluation of difficult to cannulate right arm AV fistula.  She has a right brachiocephalic fistula placed in 2013 by Dr. Donnetta Hutching.  She states over the last several months this has become much more difficult for them to access.  She states due to the tortuosity in the fistula they have a lot of trouble getting the needles in correctly.  It has become very painful for her.  She has had no previous revisions.  Right hand feels good and no steal symptoms.  She has had several fistulogram's with [facility].  One of these images pictured below and she states they had nothing to offer and sent her to a surgeon.  Past Medical History:  Diagnosis Date  . Anxiety   . Gastroparesis   . Narcotic abuse ([place])   . Non compliance with medical treatment   . Renal disorder   . Vision loss     Past Surgical History:  Procedure Laterality Date  . AV FISTULA PLACEMENT  11/17/2011   Procedure: ARTERIOVENOUS (AV) FISTULA CREATION;  Surgeon: Rosetta Posner, MD;  Location: [facility];  Service: Vascular;  Laterality: Right;  . EYE SURGERY    . KIDNEY TRANSPLANT    . NEPHRECTOMY TRANSPLANTED ORGAN    . pancrease transplant    . REFRACTIVE SURGERY      Family History  Problem Relation Age of Onset  . Hypertension Father     SOCIAL HISTORY: Social History   Socioeconomic History  . Marital status: Married    Spouse name: Not on file  . Number of children: Not on file  . Years of education: Not on file  . Highest education level: Not on file  Occupational History  . Not on file  Tobacco Use  . Smoking status: Current Some Day Smoker    Years: 1.00    Types: Pipe  . Smokeless tobacco: Never Used  . Tobacco comment:  Hooka  Vaping Use  . Vaping Use: Never used  Substance and Sexual Activity  . Alcohol use: No  . Drug use: No  . Sexual activity: Not Currently  Other Topics Concern  . Not on file  Social History Narrative   Worked for [employer]--as did husband. Had to leave [place] for safety reasons. Has been in [place] since 9/09.   Social Determinants of Health   Financial Resource Strain: Not on file  Food Insecurity: Not on file  Transportation Needs: Not on file  Physical Activity: Not on file  Stress: Not on file  Social Connections: Not on file  Intimate Partner Violence: Not on file    Allergies  Allergen Reactions  . Fish-Derived Products Swelling    Pt reports she's not allergic (08/02/2020)  . Reglan [Metoclopramide] Other (See Comments)    Pt gets akathisia Pt reports she is not allergic (08/02/2020)  . Shrimp [Shellfish Allergy] Rash    Current Outpatient Medications  Medication Sig Dispense Refill  . acetaminophen (TYLENOL) 500 MG tablet Take 1,000 mg by mouth every 6 (six) hours as needed for mild pain.    [place] Kitchen amLODipine (NORVASC) 10 MG tablet Take 10 mg by mouth daily.    [place] Kitchen aspirin EC 81 MG tablet Take 81 mg by  mouth every morning.    . calcium acetate (PHOSLO) 667 MG capsule Take 2,001 mg by mouth 3 (three) times daily with meals. And 1 capsule (667mg ) with a snack    . cycloSPORINE modified (NEORAL) 100 MG capsule Take 200 mg by mouth 2 (two) times daily.     . furosemide (LASIX) 20 MG tablet Take 2 tablets by mouth every other day.    . hydrOXYzine (ATARAX/VISTARIL) 50 MG tablet Take 50 mg by mouth every 8 (eight) hours as needed for anxiety.    [place] Kitchen labetalol (NORMODYNE) 200 MG tablet Take 200 mg by mouth 2 (two) times daily.    [place] Kitchen lidocaine-prilocaine (EMLA) cream Apply 1 application topically once.     . mycophenolate (MYFORTIC) 180 MG EC tablet Take 540 mg by mouth 2 (two) times daily.    . predniSONE (DELTASONE) 5 MG tablet Take 5 mg by mouth daily.   5  . zolpidem (AMBIEN) 10  MG tablet Take 10 mg by mouth at bedtime.    . promethazine (PHENERGAN) 25 MG suppository Place 1 suppository (25 mg total) rectally every 6 (six) hours as needed for nausea or vomiting. (Patient not taking: No sig reported) 12 each 0   No current facility-administered medications for this visit.    REVIEW OF SYSTEMS:  [X]  denotes positive finding, [ ]  denotes negative finding Cardiac  Comments:  Chest pain or chest pressure:    Shortness of breath upon exertion:    Short of breath when lying flat:    Irregular heart rhythm:        Vascular    Pain in calf, thigh, or hip brought on by ambulation:    Pain in feet at night that wakes you up from your sleep:     Blood clot in your veins:    Leg swelling:         Pulmonary    Oxygen at home:    Productive cough:     Wheezing:         Neurologic    Sudden weakness in arms or legs:     Sudden numbness in arms or legs:     Sudden onset of difficulty speaking or slurred speech:    Temporary loss of vision in one eye:     Problems with dizziness:         Gastrointestinal    Blood in stool:     Vomited blood:         Genitourinary    Burning when urinating:     Blood in urine:        Psychiatric    Major depression:         Hematologic    Bleeding problems:    Problems with blood clotting too easily:        Skin    Rashes or ulcers:        Constitutional    Fever or chills:      PHYSICAL EXAM: Vitals:   11/30/20 1437  BP: (!) 198/90  Pulse: 77  Resp: 16  Temp: 98 F (36.7 C)  TempSrc: Temporal  SpO2: 96%  Weight: 129 lb (58.5 kg)  Height: 5\' 2"  (1.575 m)    GENERAL: The patient is a well-nourished female, in no acute distress. The vital signs are documented above. CARDIAC: There is a regular rate and rhythm.  VASCULAR:  Right brachiocephalic AV fistula with good thrill, very tortuous, aneurysmal segment as pictured below PULMONARY: No respiratory distress. ABDOMEN:  Soft and non-tender.  MUSCULOSKELETAL:  There are no major deformities or cyanosis. NEUROLOGIC: No focal weakness or paresthesias are detected. SKIN: There are no ulcers or rashes noted. PSYCHIATRIC: The patient has a normal affect.        DATA:   Fistula duplex today shows widely patent right arm AV fistula with no stenosis.  Assessment/Plan:  40 year old female with ESRD presents for evaluation of difficult to access right arm brachiocephalic AV fistula.  On exam she has aneurysmal segment with overall very tortuous fistula in the upper arm as pictured above.  In discussing with her, sounds like she has had several months of difficult access where they have trouble getting the needles in place and I suspect this is related to the tortuosity.  We talked about accessing this higher up and they have tried this with no success and access has become very painful.  As a result, I have recommended right arm AV fistula revision and likely will resect her aneurysm and straighten out the fistula resect the redundant segment of vein to make cannulation easier.  We talked about [facility] catheter placement.  Risks benefits discussed including fistula not working as well after revision.  She wants to proceed next Wednesday.  She will dialyze on Tuesday.   [name], MD [facility] Office: [phone]

## 2020-12-03 DIAGNOSIS — Z23 Encounter for immunization: Secondary | ICD-10-CM | POA: Diagnosis not present

## 2020-12-03 DIAGNOSIS — N186 End stage renal disease: Secondary | ICD-10-CM | POA: Diagnosis not present

## 2020-12-03 DIAGNOSIS — N2581 Secondary hyperparathyroidism of renal origin: Secondary | ICD-10-CM | POA: Diagnosis not present

## 2020-12-03 DIAGNOSIS — E1129 Type 2 diabetes mellitus with other diabetic kidney complication: Secondary | ICD-10-CM | POA: Diagnosis not present

## 2020-12-03 DIAGNOSIS — D509 Iron deficiency anemia, unspecified: Secondary | ICD-10-CM | POA: Diagnosis not present

## 2020-12-03 DIAGNOSIS — T8610 Unspecified complication of kidney transplant: Secondary | ICD-10-CM | POA: Diagnosis not present

## 2020-12-03 DIAGNOSIS — D631 Anemia in chronic kidney disease: Secondary | ICD-10-CM | POA: Diagnosis not present

## 2020-12-03 DIAGNOSIS — Z992 Dependence on renal dialysis: Secondary | ICD-10-CM | POA: Diagnosis not present

## 2020-12-04 ENCOUNTER — Other Ambulatory Visit (HOSPITAL_COMMUNITY): Payer: Medicare Other

## 2020-12-07 ENCOUNTER — Other Ambulatory Visit (HOSPITAL_COMMUNITY)
Admission: RE | Admit: 2020-12-07 | Discharge: 2020-12-07 | Disposition: A | Discharge status: Home / Self Care | Payer: Medicare Other | Source: Ambulatory Visit | Attending: Vascular Surgery | Admitting: Vascular Surgery

## 2020-12-07 ENCOUNTER — Encounter (HOSPITAL_COMMUNITY): Payer: Self-pay | Admitting: Vascular Surgery

## 2020-12-07 DIAGNOSIS — Z01812 Encounter for preprocedural laboratory examination: Secondary | ICD-10-CM | POA: Diagnosis not present

## 2020-12-07 DIAGNOSIS — Z20822 Contact with and (suspected) exposure to covid-19: Secondary | ICD-10-CM | POA: Diagnosis not present

## 2020-12-07 LAB — SARS CORONAVIRUS 2 (TAT 6-24 HRS): SARS Coronavirus 2: NEGATIVE

## 2020-12-07 NOTE — Progress Notes (Signed)
EKG: 08/03/20 CXR: 08/02/20 ECHO: 11/27/11 Stress Test: denies Cardiac Cath: denies  Fasting Blood Sugar- na Checks Blood Sugar__na_ times a day  OSA/CPAP: No  ASA/Blood Thinners: No  covid test 12/07/20 pending  Anesthesia Review: No  Patient denies shortness of breath, fever, cough, and chest pain at PAT appointment.  Patient verbalized understanding of instructions provided today at the PAT appointment.  Patient asked to review instructions at home and day of surgery.

## 2020-12-08 ENCOUNTER — Encounter (HOSPITAL_COMMUNITY)
Admission: RE | Disposition: A | Discharge status: Home / Self Care | Payer: Self-pay | Source: Home / Self Care | Attending: Vascular Surgery

## 2020-12-08 ENCOUNTER — Other Ambulatory Visit: Payer: Self-pay

## 2020-12-08 ENCOUNTER — Ambulatory Visit (HOSPITAL_COMMUNITY): Payer: Medicare Other

## 2020-12-08 ENCOUNTER — Ambulatory Visit (HOSPITAL_COMMUNITY): Payer: Medicare Other | Admitting: Anesthesiology

## 2020-12-08 ENCOUNTER — Ambulatory Visit (HOSPITAL_COMMUNITY)
Admission: RE | Admit: 2020-12-08 | Discharge: 2020-12-08 | Disposition: A | Discharge status: Home / Self Care | Payer: Medicare Other | Attending: Vascular Surgery | Admitting: Vascular Surgery

## 2020-12-08 ENCOUNTER — Encounter (HOSPITAL_COMMUNITY): Payer: Self-pay | Admitting: Vascular Surgery

## 2020-12-08 DIAGNOSIS — T82510A Breakdown (mechanical) of surgically created arteriovenous fistula, initial encounter: Secondary | ICD-10-CM | POA: Diagnosis not present

## 2020-12-08 DIAGNOSIS — T82898A Other specified complication of vascular prosthetic devices, implants and grafts, initial encounter: Secondary | ICD-10-CM | POA: Diagnosis not present

## 2020-12-08 DIAGNOSIS — Z992 Dependence on renal dialysis: Secondary | ICD-10-CM | POA: Diagnosis not present

## 2020-12-08 DIAGNOSIS — Z91013 Allergy to seafood: Secondary | ICD-10-CM | POA: Diagnosis not present

## 2020-12-08 DIAGNOSIS — Z79899 Other long term (current) drug therapy: Secondary | ICD-10-CM | POA: Diagnosis not present

## 2020-12-08 DIAGNOSIS — Z9889 Other specified postprocedural states: Secondary | ICD-10-CM

## 2020-12-08 DIAGNOSIS — Z7982 Long term (current) use of aspirin: Secondary | ICD-10-CM | POA: Insufficient documentation

## 2020-12-08 DIAGNOSIS — E1122 Type 2 diabetes mellitus with diabetic chronic kidney disease: Secondary | ICD-10-CM | POA: Diagnosis not present

## 2020-12-08 DIAGNOSIS — F17210 Nicotine dependence, cigarettes, uncomplicated: Secondary | ICD-10-CM | POA: Insufficient documentation

## 2020-12-08 DIAGNOSIS — I517 Cardiomegaly: Secondary | ICD-10-CM | POA: Diagnosis not present

## 2020-12-08 DIAGNOSIS — Z888 Allergy status to other drugs, medicaments and biological substances status: Secondary | ICD-10-CM | POA: Insufficient documentation

## 2020-12-08 DIAGNOSIS — Y841 Kidney dialysis as the cause of abnormal reaction of the patient, or of later complication, without mention of misadventure at the time of the procedure: Secondary | ICD-10-CM | POA: Insufficient documentation

## 2020-12-08 DIAGNOSIS — J811 Chronic pulmonary edema: Secondary | ICD-10-CM | POA: Diagnosis not present

## 2020-12-08 DIAGNOSIS — Z419 Encounter for procedure for purposes other than remedying health state, unspecified: Secondary | ICD-10-CM

## 2020-12-08 DIAGNOSIS — N186 End stage renal disease: Secondary | ICD-10-CM | POA: Diagnosis not present

## 2020-12-08 DIAGNOSIS — I120 Hypertensive chronic kidney disease with stage 5 chronic kidney disease or end stage renal disease: Secondary | ICD-10-CM | POA: Diagnosis not present

## 2020-12-08 HISTORY — PX: REVISON OF ARTERIOVENOUS FISTULA: SHX6074

## 2020-12-08 HISTORY — PX: INSERTION OF DIALYSIS CATHETER: SHX1324

## 2020-12-08 HISTORY — DX: Anemia, unspecified: D64.9

## 2020-12-08 LAB — POCT I-STAT, CHEM 8
BUN: 29 mg/dL — ABNORMAL HIGH (ref 6–20)
Calcium, Ion: 1.14 mmol/L — ABNORMAL LOW (ref 1.15–1.40)
Chloride: 100 mmol/L (ref 98–111)
Creatinine, Ser: 6.9 mg/dL — ABNORMAL HIGH (ref 0.44–1.00)
Glucose, Bld: 91 mg/dL (ref 70–99)
HCT: 30 % — ABNORMAL LOW (ref 36.0–46.0)
Hemoglobin: 10.2 g/dL — ABNORMAL LOW (ref 12.0–15.0)
Potassium: 4.6 mmol/L (ref 3.5–5.1)
Sodium: 138 mmol/L (ref 135–145)
TCO2: 27 mmol/L (ref 22–32)

## 2020-12-08 LAB — POCT PREGNANCY, URINE: Preg Test, Ur: NEGATIVE

## 2020-12-08 IMAGING — DX DG CHEST 1V PORT SAME DAY
1 series · 1 of 1 positions shown · non-contrast
Comparison: Portable exam [JK] hours compared to [DATE]

CLINICAL DATA: Post RIGHT arm graft

EXAM:
PORTABLE CHEST 1 VIEW

[chest ap]
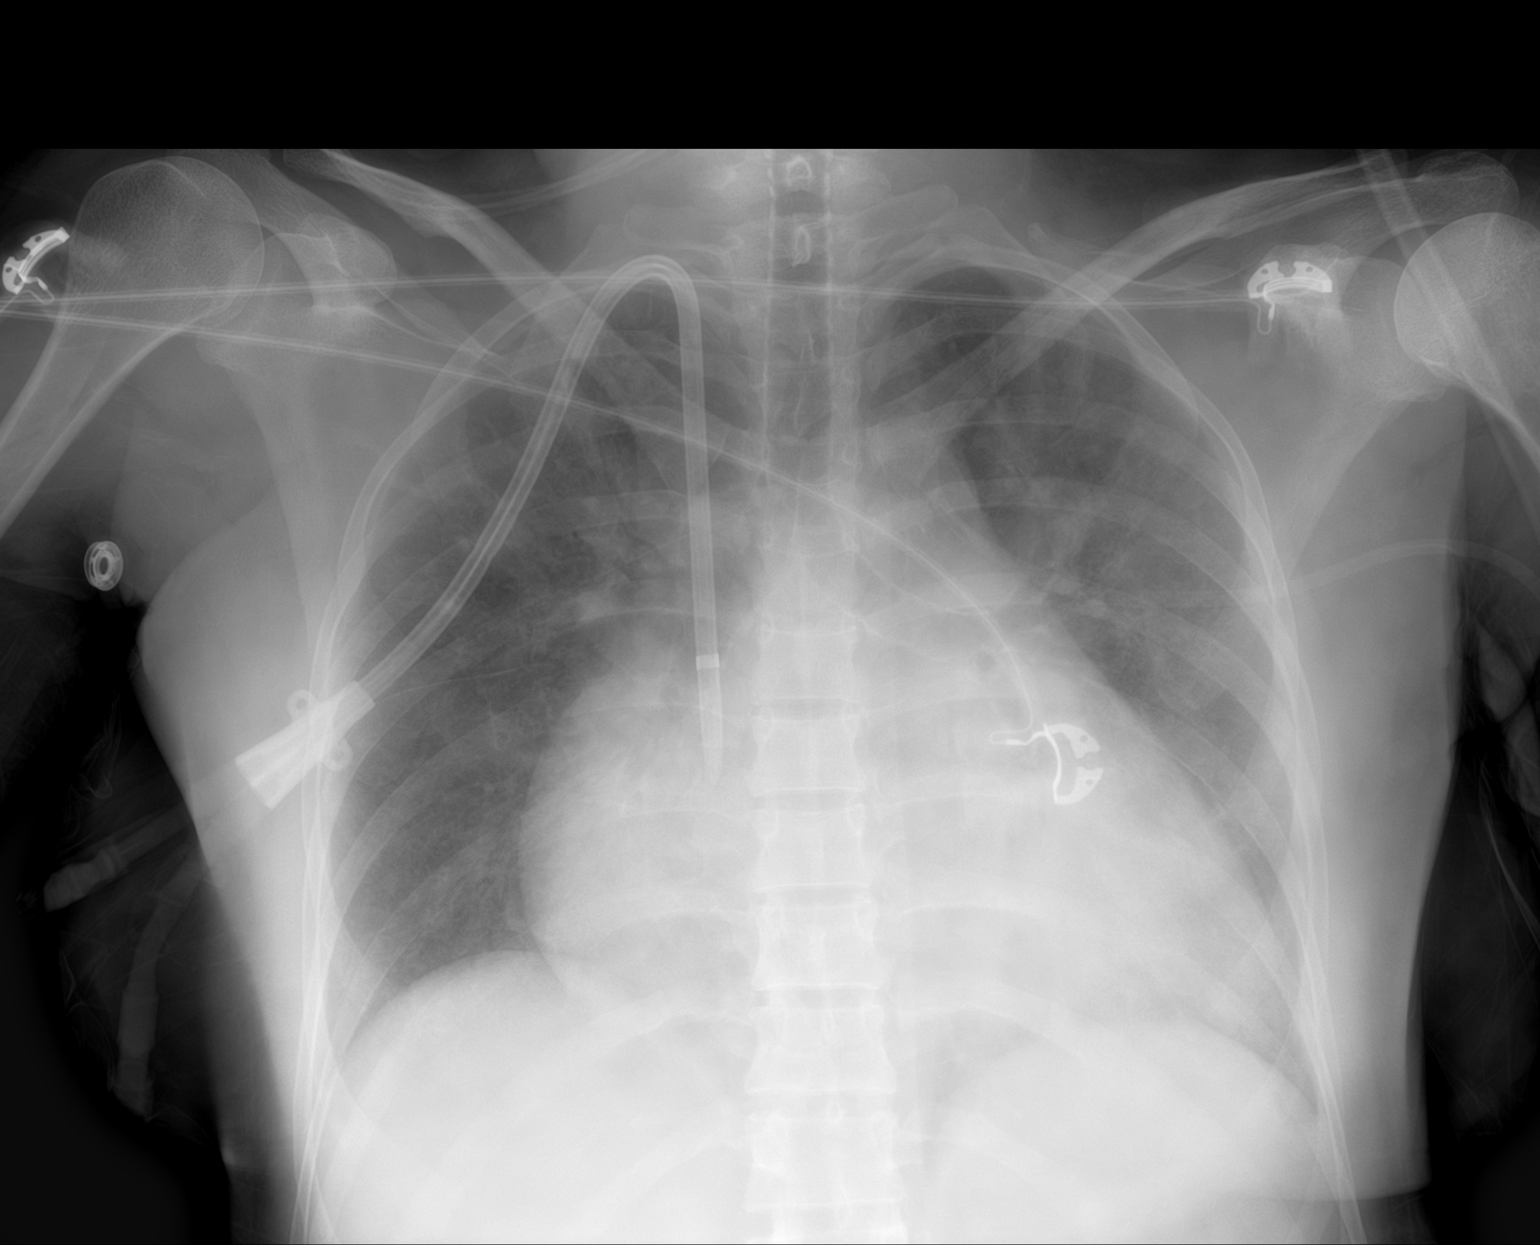

[1 of 1 positions shown; findings below may reference images not displayed]

FINDINGS: New dual lumen large-bore RIGHT jugular central venous catheter with
tip projecting over SVC.

Enlargement of cardiac silhouette with pulmonary vascular
congestion.

Minimal infiltrates consistent with pulmonary edema, greater on
LEFT.

No pleural effusion or pneumothorax.

Osseous structures stable.
IMPRESSION: No pneumothorax following RIGHT jugular line placement.

Enlargement of cardiac silhouette with pulmonary vascular congestion
and minimal pulmonary edema, greater on LEFT.

## 2020-12-08 SURGERY — REVISON OF ARTERIOVENOUS FISTULA
Anesthesia: General | Site: Chest | Laterality: Right

## 2020-12-08 MED ORDER — CHLORHEXIDINE GLUCONATE 4 % EX LIQD: 60.0000 mL | Freq: Once | CUTANEOUS | Status: DC

## 2020-12-08 MED ORDER — SODIUM CHLORIDE 0.9 % IV SOLN
INTRAVENOUS | Status: DC | PRN
  Administered 2020-12-08: 500 mL

## 2020-12-08 MED ORDER — HEPARIN SODIUM (PORCINE) 1000 UNIT/ML IJ SOLN
INTRAMUSCULAR | Status: AC
  Filled 2020-12-08: qty 1

## 2020-12-08 MED ORDER — CHLORHEXIDINE GLUCONATE 0.12 % MT SOLN
OROMUCOSAL | Status: AC
  Administered 2020-12-08: 15 mL via OROMUCOSAL
  Filled 2020-12-08: qty 15

## 2020-12-08 MED ORDER — OXYCODONE-ACETAMINOPHEN 5-325 MG PO TABS: 1.0000 | ORAL_TABLET | Freq: Four times a day (QID) | ORAL | 0 refills | Status: DC | PRN

## 2020-12-08 MED ORDER — STERILE WATER FOR IRRIGATION IR SOLN
Status: DC | PRN
  Administered 2020-12-08: 1000 mL

## 2020-12-08 MED ORDER — FENTANYL CITRATE (PF) 250 MCG/5ML IJ SOLN
INTRAMUSCULAR | Status: DC | PRN
  Administered 2020-12-08 (×4): 25 ug via INTRAVENOUS

## 2020-12-08 MED ORDER — ORAL CARE MOUTH RINSE: 15.0000 mL | Freq: Once | OROMUCOSAL | Status: AC

## 2020-12-08 MED ORDER — HYDROMORPHONE HCL 1 MG/ML IJ SOLN
INTRAMUSCULAR | Status: AC
  Administered 2020-12-08: 0.5 mg via INTRAVENOUS
  Filled 2020-12-08: qty 1

## 2020-12-08 MED ORDER — SODIUM CHLORIDE 0.9 % IV SOLN: INTRAVENOUS | Status: DC

## 2020-12-08 MED ORDER — MIDAZOLAM HCL 2 MG/2ML IJ SOLN
INTRAMUSCULAR | Status: DC | PRN
  Administered 2020-12-08: 2 mg via INTRAVENOUS

## 2020-12-08 MED ORDER — FENTANYL CITRATE (PF) 100 MCG/2ML IJ SOLN
INTRAMUSCULAR | Status: AC
  Filled 2020-12-08: qty 2

## 2020-12-08 MED ORDER — ONDANSETRON HCL 4 MG/2ML IJ SOLN: 4.0000 mg | Freq: Once | INTRAMUSCULAR | Status: DC | PRN

## 2020-12-08 MED ORDER — PROPOFOL 10 MG/ML IV BOLUS
INTRAVENOUS | Status: DC | PRN
  Administered 2020-12-08: 140 mg via INTRAVENOUS

## 2020-12-08 MED ORDER — PHENYLEPHRINE 40 MCG/ML (10ML) SYRINGE FOR IV PUSH (FOR BLOOD PRESSURE SUPPORT)
PREFILLED_SYRINGE | INTRAVENOUS | Status: DC | PRN
  Administered 2020-12-08: 120 ug via INTRAVENOUS

## 2020-12-08 MED ORDER — CEFAZOLIN SODIUM-DEXTROSE 2-4 GM/100ML-% IV SOLN
2.0000 g | INTRAVENOUS | Status: AC
  Administered 2020-12-08: 2 g via INTRAVENOUS
  Filled 2020-12-08: qty 100

## 2020-12-08 MED ORDER — SODIUM CHLORIDE 0.9 % IV SOLN
Freq: Once | INTRAVENOUS | Status: DC
  Filled 2020-12-08: qty 1.2

## 2020-12-08 MED ORDER — ONDANSETRON HCL 4 MG/2ML IJ SOLN
INTRAMUSCULAR | Status: DC | PRN
  Administered 2020-12-08: 4 mg via INTRAVENOUS

## 2020-12-08 MED ORDER — PROPOFOL 10 MG/ML IV BOLUS
INTRAVENOUS | Status: AC
  Filled 2020-12-08: qty 20

## 2020-12-08 MED ORDER — FENTANYL CITRATE (PF) 250 MCG/5ML IJ SOLN
INTRAMUSCULAR | Status: AC
  Filled 2020-12-08: qty 5

## 2020-12-08 MED ORDER — MIDAZOLAM HCL 2 MG/2ML IJ SOLN
INTRAMUSCULAR | Status: AC
  Filled 2020-12-08: qty 2

## 2020-12-08 MED ORDER — DEXMEDETOMIDINE (PRECEDEX) IN NS 20 MCG/5ML (4 MCG/ML) IV SYRINGE
PREFILLED_SYRINGE | INTRAVENOUS | Status: AC
  Filled 2020-12-08: qty 5

## 2020-12-08 MED ORDER — LIDOCAINE 2% (20 MG/ML) 5 ML SYRINGE
INTRAMUSCULAR | Status: AC
  Filled 2020-12-08: qty 5

## 2020-12-08 MED ORDER — CHLORHEXIDINE GLUCONATE 0.12 % MT SOLN: 15.0000 mL | Freq: Once | OROMUCOSAL | Status: AC

## 2020-12-08 MED ORDER — HYDROMORPHONE HCL 1 MG/ML IJ SOLN
0.2500 mg | INTRAMUSCULAR | Status: DC | PRN
  Administered 2020-12-08 (×2): 0.5 mg via INTRAVENOUS

## 2020-12-08 MED ORDER — 0.9 % SODIUM CHLORIDE (POUR BTL) OPTIME
TOPICAL | Status: DC | PRN
  Administered 2020-12-08: 1000 mL

## 2020-12-08 MED ORDER — HEPARIN SODIUM (PORCINE) 1000 UNIT/ML IJ SOLN
INTRAMUSCULAR | Status: DC | PRN
  Administered 2020-12-08: 3200 [IU]

## 2020-12-08 MED ORDER — HEPARIN SODIUM (PORCINE) 1000 UNIT/ML IJ SOLN
INTRAMUSCULAR | Status: DC | PRN
  Administered 2020-12-08: 3000 [IU] via INTRAVENOUS

## 2020-12-08 MED ORDER — PROTAMINE SULFATE 10 MG/ML IV SOLN
INTRAVENOUS | Status: DC | PRN
  Administered 2020-12-08 (×2): 10 mg via INTRAVENOUS

## 2020-12-08 MED ORDER — PHENYLEPHRINE HCL-NACL 10-0.9 MG/250ML-% IV SOLN
INTRAVENOUS | Status: DC | PRN
  Administered 2020-12-08: 20 ug/min via INTRAVENOUS

## 2020-12-08 MED ORDER — HYDROMORPHONE HCL 1 MG/ML IJ SOLN
INTRAMUSCULAR | Status: AC
  Filled 2020-12-08: qty 1

## 2020-12-08 MED ORDER — DEXAMETHASONE SODIUM PHOSPHATE 10 MG/ML IJ SOLN
INTRAMUSCULAR | Status: DC | PRN
  Administered 2020-12-08: 10 mg via INTRAVENOUS

## 2020-12-08 MED ORDER — LIDOCAINE 2% (20 MG/ML) 5 ML SYRINGE
INTRAMUSCULAR | Status: DC | PRN
  Administered 2020-12-08: 40 mg via INTRAVENOUS

## 2020-12-08 SURGICAL SUPPLY — 50 items
ARMBAND PINK RESTRICT EXTREMIT (MISCELLANEOUS) ×3 IMPLANT
BAG DECANTER FOR FLEXI CONT (MISCELLANEOUS) ×3 IMPLANT
BIOPATCH RED 1 DISK 7.0 (GAUZE/BANDAGES/DRESSINGS) ×3 IMPLANT
BNDG ELASTIC 4X5.8 VLCR STR LF (GAUZE/BANDAGES/DRESSINGS) ×3 IMPLANT
BNDG GAUZE ELAST 4 BULKY (GAUZE/BANDAGES/DRESSINGS) ×3 IMPLANT
CANISTER SUCT 3000ML PPV (MISCELLANEOUS) ×3 IMPLANT
CATH PALINDROME-P 19CM W/VT (CATHETERS) ×3 IMPLANT
CLIP VESOCCLUDE MED 6/CT (CLIP) ×3 IMPLANT
CLIP VESOCCLUDE SM WIDE 6/CT (CLIP) ×3 IMPLANT
COVER PROBE W GEL 5X96 (DRAPES) ×3 IMPLANT
COVER SURGICAL LIGHT HANDLE (MISCELLANEOUS) ×3 IMPLANT
DECANTER SPIKE VIAL GLASS SM (MISCELLANEOUS) ×3 IMPLANT
DERMABOND ADVANCED (GAUZE/BANDAGES/DRESSINGS) ×1
DERMABOND ADVANCED .7 DNX12 (GAUZE/BANDAGES/DRESSINGS) ×2 IMPLANT
DRAPE C-ARM 42X72 X-RAY (DRAPES) ×3 IMPLANT
ELECT REM PT RETURN 9FT ADLT (ELECTROSURGICAL) ×3
ELECTRODE REM PT RTRN 9FT ADLT (ELECTROSURGICAL) ×2 IMPLANT
GAUZE 4X4 16PLY RFD (DISPOSABLE) ×6 IMPLANT
GAUZE SPONGE 4X4 12PLY STRL (GAUZE/BANDAGES/DRESSINGS) ×3 IMPLANT
GLOVE BIO SURGEON STRL SZ7.5 (GLOVE) ×3 IMPLANT
GLOVE SRG 8 PF TXTR STRL LF DI (GLOVE) ×2 IMPLANT
GLOVE SURG UNDER POLY LF SZ8 (GLOVE) ×1
GOWN STRL REUS W/ TWL LRG LVL3 (GOWN DISPOSABLE) ×4 IMPLANT
GOWN STRL REUS W/ TWL XL LVL3 (GOWN DISPOSABLE) ×4 IMPLANT
GOWN STRL REUS W/TWL LRG LVL3 (GOWN DISPOSABLE) ×2
GOWN STRL REUS W/TWL XL LVL3 (GOWN DISPOSABLE) ×2
KIT BASIN OR (CUSTOM PROCEDURE TRAY) ×3 IMPLANT
KIT TURNOVER KIT B (KITS) ×3 IMPLANT
NEEDLE 18GX1X1/2 (RX/OR ONLY) (NEEDLE) ×3 IMPLANT
NEEDLE HYPO 25GX1X1/2 BEV (NEEDLE) ×3 IMPLANT
NS IRRIG 1000ML POUR BTL (IV SOLUTION) ×3 IMPLANT
PACK CV ACCESS (CUSTOM PROCEDURE TRAY) ×3 IMPLANT
PAD ARMBOARD 7.5X6 YLW CONV (MISCELLANEOUS) ×6 IMPLANT
STAPLER VISISTAT 35W (STAPLE) IMPLANT
SUT ETHILON 3 0 PS 1 (SUTURE) ×3 IMPLANT
SUT MNCRL AB 4-0 PS2 18 (SUTURE) ×6 IMPLANT
SUT PROLENE 5 0 C 1 24 (SUTURE) ×15 IMPLANT
SUT PROLENE 7 0 BV 1 (SUTURE) ×3 IMPLANT
SUT SILK 3 0 (SUTURE) ×1
SUT SILK 3-0 18XBRD TIE 12 (SUTURE) ×2 IMPLANT
SUT VIC AB 3-0 SH 27 (SUTURE) ×1
SUT VIC AB 3-0 SH 27X BRD (SUTURE) ×2 IMPLANT
SYR 10ML LL (SYRINGE) ×3 IMPLANT
SYR 20ML LL LF (SYRINGE) ×6 IMPLANT
SYR 5ML LL (SYRINGE) ×3 IMPLANT
SYR CONTROL 10ML LL (SYRINGE) ×3 IMPLANT
TOWEL GREEN STERILE (TOWEL DISPOSABLE) ×3 IMPLANT
TOWEL GREEN STERILE FF (TOWEL DISPOSABLE) ×3 IMPLANT
UNDERPAD 30X36 HEAVY ABSORB (UNDERPADS AND DIAPERS) ×3 IMPLANT
WATER STERILE IRR 1000ML POUR (IV SOLUTION) ×3 IMPLANT

## 2020-12-08 NOTE — Anesthesia Preprocedure Evaluation (Addendum)
Anesthesia Evaluation  Patient identified by MRN, date of birth, ID band Patient awake    Reviewed: Allergy & Precautions, H&P , NPO status , Patient's Chart, lab work & pertinent test results, reviewed documented beta blocker date and time   Airway Mallampati: III  TM Distance: >3 FB Neck ROM: Full    Dental  (+) Edentulous Upper, Caps, Dental Advisory Given, Missing   Pulmonary shortness of breath and with exertion, pneumonia, resolved, former smoker,    Pulmonary exam normal breath sounds clear to auscultation       Cardiovascular hypertension, Pt. 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 Last dialysis yesterday  negative genitourinary   Musculoskeletal negative musculoskeletal ROS (+) narcotic dependent  Abdominal Normal abdominal exam  (+)   Peds  Hematology  (+) Blood dyscrasia, anemia ,   Anesthesia Other Findings   Reproductive/Obstetrics negative OB ROS                            Anesthesia Physical  Anesthesia Plan  ASA: IV  Anesthesia Plan: General   Post-op Pain Management:    Induction: Intravenous  PONV Risk Score and Plan: 4 or greater and Treatment may vary due to age or medical condition, Ondansetron, Dexamethasone and Midazolam  Airway Management Planned: LMA  Additional Equipment:   Intra-op Plan:   Post-operative Plan: Extubation in OR  Informed Consent: I have reviewed the patients History and Physical, chart, labs and discussed the procedure including the risks, benefits and alternatives for the proposed anesthesia with the  patient or authorized representative who has indicated his/her understanding and acceptance.     Dental advisory given  Plan Discussed with: CRNA and Anesthesiologist  Anesthesia Plan Comments:         Anesthesia Quick Evaluation

## 2020-12-08 NOTE — Anesthesia Postprocedure Evaluation (Signed)
Anesthesia Post Note  Patient: [name] [name]  Procedure(s) Performed: RIGHT ARM ARTERIOVENOUS FISTULA REVISION AND RESECTION (Right Arm Upper) INSERTION OF TUNNELED DIALYSIS CATHETER (Right Chest)     Patient location during evaluation: PACU Anesthesia Type: General Level of consciousness: awake and alert Pain management: pain level controlled Vital Signs Assessment: post-procedure vital signs reviewed and stable Respiratory status: spontaneous breathing, nonlabored ventilation and respiratory function stable Cardiovascular status: blood pressure returned to baseline and stable Postop Assessment: no apparent nausea or vomiting Anesthetic complications: no   No complications documented.  Last Vitals:  Vitals:   12/08/20 1537 12/08/20 1552  BP: (!) 159/79 (!) 152/86  Pulse: 83 82  Resp: 14 19  Temp:    SpO2: 94% 94%    Last Pain:  Vitals:   12/08/20 1552  TempSrc:   PainSc: 6                  Maurica Omura A.

## 2020-12-08 NOTE — Progress Notes (Signed)
Patient ready for discharge. Waiting for ride. Stable does not require monitoring.

## 2020-12-08 NOTE — H&P (Signed)
History and Physical Interval Note:  12/08/2020 10:45 AM  [name] [name]  has presented today for surgery, with the diagnosis of COMPLICATION OF ARTERIOVENOUS FISTULA.  The various methods of treatment have been discussed with the patient and family. After consideration of risks, benefits and other options for treatment, the patient has consented to  Procedure(s): RIGHT ARM ARTERIOVENOUS FISTULA REVISION (Right) INSERTION OF DIALYSIS CATHETER (N/A) as a surgical intervention.  The patient's history has been reviewed, patient examined, no change in status, stable for surgery.  I have reviewed the patient's chart and labs.  Questions were answered to the patient's satisfaction.    Right arm AVF revision with TDC.  [name]  Patient name: [name] [name]      MRN: [id number]        DOB: 01/23/1981            Sex: female  REASON FOR CONSULT: Evaluate right arm AV fistula, difficulty with access  HPI: [name] [name] is a 40 y.o. female, with history of end-stage renal disease on dialysis Monday Wednesday Friday that presents for evaluation of difficult to cannulate right arm AV fistula.  She has a right brachiocephalic fistula placed in 2013 by Dr. Donnetta Hutching.  She states over the last several months this has become much more difficult for them to access.  She states due to the tortuosity in the fistula they have a lot of trouble getting the needles in correctly.  It has become very painful for her.  She has had no previous revisions.  Right hand feels good and no steal symptoms.  She has had several fistulogram's with [facility].  One of these images pictured below and she states they had nothing to offer and sent her to a surgeon.      Past Medical History:  Diagnosis Date  . Anxiety   . Gastroparesis   . Narcotic abuse ([place])   . Non compliance with medical treatment   . Renal disorder   . Vision loss          Past Surgical History:  Procedure Laterality Date  .  AV FISTULA PLACEMENT  11/17/2011   Procedure: ARTERIOVENOUS (AV) FISTULA CREATION;  Surgeon: Rosetta Posner, MD;  Location: [facility];  Service: Vascular;  Laterality: Right;  . EYE SURGERY    . KIDNEY TRANSPLANT    . NEPHRECTOMY TRANSPLANTED ORGAN    . pancrease transplant    . REFRACTIVE SURGERY           Family History  Problem Relation Age of Onset  . Hypertension Father     SOCIAL HISTORY: Social History        Socioeconomic History  . Marital status: Married    Spouse name: Not on file  . Number of children: Not on file  . Years of education: Not on file  . Highest education level: Not on file  Occupational History  . Not on file  Tobacco Use  . Smoking status: Current Some Day Smoker    Years: 1.00    Types: Pipe  . Smokeless tobacco: Never Used  . Tobacco comment: Hooka  Vaping Use  . Vaping Use: Never used  Substance and Sexual Activity  . Alcohol use: No  . Drug use: No  . Sexual activity: Not Currently  Other Topics Concern  . Not on file  Social History Narrative   Worked for [employer]--as did husband. Had to leave [place] for safety reasons. Has been in  [place] since 9/09.   Social Determinants of Health   Financial Resource Strain: Not on file  Food Insecurity: Not on file  Transportation Needs: Not on file  Physical Activity: Not on file  Stress: Not on file  Social Connections: Not on file  Intimate Partner Violence: Not on file         Allergies  Allergen Reactions  . Fish-Derived Products Swelling    Pt reports she's not allergic (08/02/2020)  . Reglan [Metoclopramide] Other (See Comments)    Pt gets akathisia Pt reports she is not allergic (08/02/2020)  . Shrimp [Shellfish Allergy] Rash          Current Outpatient Medications  Medication Sig Dispense Refill  . acetaminophen (TYLENOL) 500 MG tablet Take 1,000 mg by mouth every 6 (six) hours as needed for mild pain.    Marland Kitchen amLODipine (NORVASC) 10 MG tablet Take  10 mg by mouth daily.    Marland Kitchen aspirin EC 81 MG tablet Take 81 mg by mouth every morning.    . calcium acetate (PHOSLO) 667 MG capsule Take 2,001 mg by mouth 3 (three) times daily with meals. And 1 capsule (667mg ) with a snack    . cycloSPORINE modified (NEORAL) 100 MG capsule Take 200 mg by mouth 2 (two) times daily.     . furosemide (LASIX) 20 MG tablet Take 2 tablets by mouth every other day.    . hydrOXYzine (ATARAX/VISTARIL) 50 MG tablet Take 50 mg by mouth every 8 (eight) hours as needed for anxiety.    Marland Kitchen labetalol (NORMODYNE) 200 MG tablet Take 200 mg by mouth 2 (two) times daily.    Marland Kitchen lidocaine-prilocaine (EMLA) cream Apply 1 application topically once.     . mycophenolate (MYFORTIC) 180 MG EC tablet Take 540 mg by mouth 2 (two) times daily.    . predniSONE (DELTASONE) 5 MG tablet Take 5 mg by mouth daily.   5  . zolpidem (AMBIEN) 10 MG tablet Take 10 mg by mouth at bedtime.    . promethazine (PHENERGAN) 25 MG suppository Place 1 suppository (25 mg total) rectally every 6 (six) hours as needed for nausea or vomiting. (Patient not taking: No sig reported) 12 each 0   No current facility-administered medications for this visit.    REVIEW OF SYSTEMS:  [X]  denotes positive finding, [ ]  denotes negative finding Cardiac  Comments:  Chest pain or chest pressure:    Shortness of breath upon exertion:    Short of breath when lying flat:    Irregular heart rhythm:        Vascular    Pain in calf, thigh, or hip brought on by ambulation:    Pain in feet at night that wakes you up from your sleep:     Blood clot in your veins:    Leg swelling:         Pulmonary    Oxygen at home:    Productive cough:     Wheezing:         Neurologic    Sudden weakness in arms or legs:     Sudden numbness in arms or legs:     Sudden onset of difficulty speaking or slurred speech:    Temporary loss of vision in one eye:     Problems  with dizziness:         Gastrointestinal    Blood in stool:     Vomited blood:         Genitourinary  Burning when urinating:     Blood in urine:        Psychiatric    Major depression:         Hematologic    Bleeding problems:    Problems with blood clotting too easily:        Skin    Rashes or ulcers:        Constitutional    Fever or chills:      PHYSICAL EXAM:    Vitals:   11/30/20 1437  BP: (!) 198/90  Pulse: 77  Resp: 16  Temp: 98 F (36.7 C)  TempSrc: Temporal  SpO2: 96%  Weight: 129 lb (58.5 kg)  Height: 5\' 2"  (1.575 m)    GENERAL: The patient is a well-nourished female, in no acute distress. The vital signs are documented above. CARDIAC: There is a regular rate and rhythm.  VASCULAR:  Right brachiocephalic AV fistula with good thrill, very tortuous, aneurysmal segment as pictured below PULMONARY: No respiratory distress. ABDOMEN: Soft and non-tender.  MUSCULOSKELETAL: There are no major deformities or cyanosis. NEUROLOGIC: No focal weakness or paresthesias are detected. SKIN: There are no ulcers or rashes noted. PSYCHIATRIC: The patient has a normal affect.        DATA:   Fistula duplex today shows widely patent right arm AV fistula with no stenosis.  Assessment/Plan:  40 year old female with ESRD presents for evaluation of difficult to access right arm brachiocephalic AV fistula.  On exam she has aneurysmal segment with overall very tortuous fistula in the upper arm as pictured above.  In discussing with her, sounds like she has had several months of difficult access where they have trouble getting the needles in place and I suspect this is related to the tortuosity.  We talked about accessing this higher up and they have tried this with no success and access has become very painful.  As a result, I have recommended right arm AV fistula revision and likely will resect her aneurysm  and straighten out the fistula resect the redundant segment of vein to make cannulation easier.  We talked about [facility] catheter placement.  Risks benefits discussed including fistula not working as well after revision.  She wants to proceed next Wednesday.  She will dialyze on Tuesday.   [name], MD [facility] Office: [phone]

## 2020-12-08 NOTE — Anesthesia Procedure Notes (Signed)
Procedure Name: LMA Insertion Date/Time: 12/08/2020 1:12 PM Performed by: Thelma Comp, CRNA Pre-anesthesia Checklist: Patient identified, Emergency Drugs available, Suction available and Patient being monitored Patient Re-evaluated:Patient Re-evaluated prior to induction Oxygen Delivery Method: Circle System Utilized Preoxygenation: Pre-oxygenation with 100% oxygen Induction Type: IV induction Ventilation: Mask ventilation without difficulty LMA: LMA inserted LMA Size: 4.0 Number of attempts: 1 Placement Confirmation: positive ETCO2 Tube secured with: Tape Dental Injury: Teeth and Oropharynx as per pre-operative assessment

## 2020-12-08 NOTE — Op Note (Addendum)
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  Ultrasound-guided access of right internal jugular vein 2.  Placement of right internal jugular vein tunneled dialysis catheter (19 cm palindrome) 3.  Revision of right arm AV fistula with aneurysm resection and new primary end-to-end anastomosis  Surgeon: Dr. [name], MD  Assistant: Roxy Horseman, PA  Indications: Patient is a 40 year old female with end-stage renal disease on hemodialysis.  She was seen in the clinic with a right arm brachiocephalic AV fistula placed in 2013.  This has an aneurysmal segment and is very tortuous and she has had a lot of difficulty with cannulation with ongoing pain.  She presents today for tunneled dialysis catheter placement and revision of her fistula with resection of the aneurysm after risk benefits discussed.  An assistant was needed for exposure and to expedite the case.  Findings: Right IJ was successfully cannulated under ultrasound guidance and a 19 cm palindrome catheter was placed in the right atrium.  I then fully mobilized the right arm brachiocephalic AV fistula and the aneurysm was resected and a new primary end-to-end anastomosis was performed with good thrill.    Anesthesia: LMA  Details: Patient was taken to the operating room after informed consent was obtained.  Placed on operative table in the supine position.  Anesthesia was induced.  Bilateral necks and her right arm were then prepped and draped in standard sterile fashion.  Timeout was performed and antibiotics were given.  Initially evaluated the right internal jugular vein with ultrasound, it was patent, and image was saved.  I then placed a 18-gauge needle into the right IJ under ultrasound guidance and advanced a J-wire into the right atrium.  That point in time a 19 cm palindrome  catheter was measured on the chest wall.  I then made a counterincision on the chest wall for the tunnel and also made a stab incision at the IJ insertion site and then tunneled from the chest wall exit site to the IJ stick site with tunneler and catheter attached.  The cuff was buried under the skin.  That point in time I then dilated sequentially over the wire in the right IJ into the right atrium and then placed a large dilator peel-away sheath.  The wire and inner dilator was removed.  Under fluoroscopy advance the catheter tip into the right atrium and this was positioned and the sheath was peeled away.  We had good flushing and aspiration of the catheter.  I then closed the IJ stick site with a 4-0 Monocryl subcuticular stitch.  The catheter was secured to the chest wall with multiple 3-0 nylon's.  Dermabond was applied. Sterile dressings were applied.  Then turned our attention to the right arm, where I marked out an incision over her very tortuous and aneurysmal right upper extremity brachiocephalic fistula.  I made a longitudinal incision over the fistula with 15 blade scalpel.  Dissected down with Bovie cautery and Metzenbaum scissors and the fistula was circumferentially mobilized.  We encountered multiple branches that were ligated between 3-0 silk ties and divided.  Ultimately mobilized a fairly extensive length of the cephalic vein in the upper arm in order to resect the aneurysm and create a new end-to-end anastomosis.  Once we had good length mobilized patient was given 3000 units of IV heparin.  I used fistula clamps proximally distally and the large tortuous  aneurysm was resected.  I then spatulated each end and a new primary end-to-end anastomosis was sewn with 5-0 Prolene parachute technique.  Everything was de-aired prior to completion.  We gave 20 mg protamine for reversal.  The wound was irrigated and we got hemostasis with cautery.  I closed subcutaneous tissue with 3-0 Vicryl and skin  closed with 4-0 Monocryl and Dermabond.  Complication: None  Condition: Stable  [name], MD [facility] Office: [facility]

## 2020-12-08 NOTE — Transfer of Care (Signed)
Immediate Anesthesia Transfer of Care Note  Patient: [name] [name]  Procedure(s) Performed: RIGHT ARM ARTERIOVENOUS FISTULA REVISION AND RESECTION (Right Arm Upper) INSERTION OF TUNNELED DIALYSIS CATHETER (Right Chest)  Patient Location: PACU  Anesthesia Type:General  Level of Consciousness: drowsy and patient cooperative  Airway & Oxygen Therapy: Patient Spontanous Breathing and Patient connected to face mask oxygen  Post-op Assessment: Report given to RN and Post -op Vital signs reviewed and stable  Post vital signs: Reviewed and stable  Last Vitals:  Vitals Value Taken Time  BP 138/79 12/08/20 1522  Temp    Pulse 79 12/08/20 1523  Resp 18 12/08/20 1523  SpO2 98 % 12/08/20 1523  Vitals shown include unvalidated device data.  Last Pain:  Vitals:   12/08/20 1122  TempSrc:   PainSc: 0-No pain         Complications: No complications documented.

## 2020-12-09 ENCOUNTER — Telehealth: Payer: Self-pay

## 2020-12-09 ENCOUNTER — Other Ambulatory Visit: Payer: Self-pay

## 2020-12-09 ENCOUNTER — Encounter (HOSPITAL_COMMUNITY): Payer: Self-pay | Admitting: Vascular Surgery

## 2020-12-09 NOTE — Telephone Encounter (Signed)
Patient called to c/o severe pain s/p AVF revision yesterday. Says she was not in pain til 8 am this morning and she is now taking oxycodone q4 instead of q6 and says she is experiencing no relief. Denies any other problems or symptoms, just pain. Says "the medicine at the hospital was better." Advised her we could not give her anything stronger and if she was unable to tolerate the pain she would need to report to the emergency room. Patient verbalizes understanding.

## 2020-12-14 ENCOUNTER — Telehealth: Payer: Self-pay

## 2020-12-14 NOTE — Telephone Encounter (Signed)
Patient is s/p R AVF & R TDC placement on 12/08/2020. She is c/o throbbing pain in her right side. Says it really hurts when they clean it and it hurts up to her neck. Says it is red, but denies drainage, fever and chills. Arm is swollen, but is going down - advises her hand does not hurt and she can move her fingers. She was taking 2 oxycodone q4h and it is "barely touching the pain". Offered patient appt with [facility] today - unable to come in today or tomorrow. Placed patient on schedule for PA visit.

## 2020-12-16 ENCOUNTER — Other Ambulatory Visit: Payer: Self-pay

## 2020-12-16 ENCOUNTER — Ambulatory Visit (INDEPENDENT_AMBULATORY_CARE_PROVIDER_SITE_OTHER): Payer: Medicare Other | Admitting: Physician Assistant

## 2020-12-16 VITALS — BP 166/84 | HR 82 | Temp 98.5°F | Resp 20 | Ht 62.0 in | Wt 134.6 lb

## 2020-12-16 DIAGNOSIS — N186 End stage renal disease: Secondary | ICD-10-CM

## 2020-12-16 DIAGNOSIS — G8918 Other acute postprocedural pain: Secondary | ICD-10-CM

## 2020-12-16 MED ORDER — HYDROCODONE-ACETAMINOPHEN 5-325 MG PO TABS: 1.0000 | ORAL_TABLET | ORAL | 0 refills | Status: AC | PRN

## 2020-12-16 NOTE — Progress Notes (Signed)
POST OPERATIVE DIALYSIS ACCESS OFFICE NOTE    CC:  F/u for dialysis access surgery one week ago  HPI:  This is a 40 y.o. female who is s/p Revision of right arm AV fistula with aneurysm resection and new primary end-to-end anastomosis by Dr. Carlis Abbott on 12/08/2020.  This was performed due to a history of several months difficulty accessing her fistula likely due to tortuosity.  She also underwent placement of right internal jugular TDC.  Her postprocedure chest x-ray revealed no effusion or pneumothorax.  She telephoned our office with complaints of significant pian in incision and at catheter site.  She denies had pain, fever, or chills.  States no difficulty with treatment via TDC.   She is status post right brachiocephalic fistula placed in 2013 by Dr. Donnetta Hutching.    Dialysis days: Monday, Wednesday, Friday Dialysis center:    No Known Allergies  Current Outpatient Medications  Medication Sig Dispense Refill  . acetaminophen (TYLENOL) 500 MG tablet Take 1,000 mg by mouth every 6 (six) hours as needed for mild pain.    Marland Kitchen amLODipine (NORVASC) 10 MG tablet Take 10 mg by mouth daily.    Marland Kitchen aspirin EC 81 MG tablet Take 81 mg by mouth every morning.    . calcium acetate (PHOSLO) 667 MG capsule Take 2,001 mg by mouth 3 (three) times daily with meals. Take 1334 mg with a snack    . cycloSPORINE modified (NEORAL) 100 MG capsule Take 200 mg by mouth 2 (two) times daily.    . cycloSPORINE modified (NEORAL) 25 MG capsule Take 25 mg by mouth daily. Take with 100 mg during the day    . furosemide (LASIX) 20 MG tablet Take 40 mg by mouth See admin instructions. Take on tues, thurs, Saturday and sunday    . labetalol (NORMODYNE) 200 MG tablet Take 400 mg by mouth 2 (two) times daily.    . mycophenolate (MYFORTIC) 180 MG EC tablet Take 540 mg by mouth 2 (two) times daily.    . predniSONE (DELTASONE) 5 MG tablet Take 15 mg by mouth daily.  5  . zolpidem (AMBIEN) 10 MG tablet Take 10 mg by mouth at bedtime.     . lidocaine-prilocaine (EMLA) cream Apply 1 application topically daily as needed (pain). (Patient not taking: Reported on 12/16/2020)    . oxyCODONE-acetaminophen (PERCOCET/ROXICET) 5-325 MG tablet Take 1 tablet by mouth every 6 (six) hours as needed. (Patient not taking: Reported on 12/16/2020) 30 tablet 0  . promethazine (PHENERGAN) 25 MG suppository Place 1 suppository (25 mg total) rectally every 6 (six) hours as needed for nausea or vomiting. (Patient not taking: Reported on 12/16/2020) 12 each 0   No current facility-administered medications for this visit.     ROS:  See HPI  BP (!) 166/84 (BP Location: Left Arm, Patient Position: Sitting, Cuff Size: Normal)   Pulse 82   Temp 98.5 F (36.9 C) (Temporal)   Resp 20   Ht 5\' 2"  (1.575 m)   Wt 134 lb 9.6 oz (61.1 kg)   SpO2 95%   BMI 24.62 kg/m    Physical Exam:  General appearance: in mild distress due to pain Cardiac: RRR Respiratory: non-labored HEENT: Catheter site without signs of infection or drainage. Incision: well approximated without hematoma, erythema or drainage. + TTP Extremities:  Right hand is warm with 5/5 hand grip strength.  Sensation intact. She has a 2+ radial pulse.  Bruit detected in fistula.  It is somewhat pulsatile.   Assessment/Plan:   -  pt does not have evidence of steal syndrome -She continues to have significant postop pain.  I will prescribe limited number of hydrocodone to use prior to dialysis as she says this is when her pain tends to be worse. -Follow-up in approximately 4 weeks  Barbie Banner, PA-C 12/16/2020 11:02 AM [facility] [phone]  Clinic MD: Oneida Alar

## 2020-12-21 ENCOUNTER — Ambulatory Visit: Payer: Medicare Other

## 2020-12-22 DIAGNOSIS — D689 Coagulation defect, unspecified: Secondary | ICD-10-CM | POA: Insufficient documentation

## 2020-12-22 DIAGNOSIS — E1129 Type 2 diabetes mellitus with other diabetic kidney complication: Secondary | ICD-10-CM | POA: Diagnosis not present

## 2020-12-22 DIAGNOSIS — Z5181 Encounter for therapeutic drug level monitoring: Secondary | ICD-10-CM | POA: Diagnosis not present

## 2020-12-28 ENCOUNTER — Ambulatory Visit: Payer: Medicare Other

## 2021-01-02 DIAGNOSIS — N186 End stage renal disease: Secondary | ICD-10-CM | POA: Diagnosis not present

## 2021-01-02 DIAGNOSIS — Z992 Dependence on renal dialysis: Secondary | ICD-10-CM | POA: Diagnosis not present

## 2021-01-02 DIAGNOSIS — T8610 Unspecified complication of kidney transplant: Secondary | ICD-10-CM | POA: Diagnosis not present

## 2021-01-03 DIAGNOSIS — Z992 Dependence on renal dialysis: Secondary | ICD-10-CM | POA: Diagnosis not present

## 2021-01-03 DIAGNOSIS — D509 Iron deficiency anemia, unspecified: Secondary | ICD-10-CM | POA: Diagnosis not present

## 2021-01-03 DIAGNOSIS — R110 Nausea: Secondary | ICD-10-CM | POA: Diagnosis not present

## 2021-01-03 DIAGNOSIS — N186 End stage renal disease: Secondary | ICD-10-CM | POA: Diagnosis not present

## 2021-01-03 DIAGNOSIS — E1129 Type 2 diabetes mellitus with other diabetic kidney complication: Secondary | ICD-10-CM | POA: Diagnosis not present

## 2021-01-03 DIAGNOSIS — N2581 Secondary hyperparathyroidism of renal origin: Secondary | ICD-10-CM | POA: Diagnosis not present

## 2021-01-03 DIAGNOSIS — D631 Anemia in chronic kidney disease: Secondary | ICD-10-CM | POA: Diagnosis not present

## 2021-01-03 DIAGNOSIS — Z23 Encounter for immunization: Secondary | ICD-10-CM | POA: Diagnosis not present

## 2021-01-04 ENCOUNTER — Telehealth: Payer: Self-pay | Admitting: *Deleted

## 2021-01-04 NOTE — Telephone Encounter (Signed)
Spoke with pt about the dialysis center wanting to use her fistula s/p revision almost a month ago. Per Dr. Carlis Abbott, fistula can be used for dialysis. However, if the pt isn't comfortable yet, she can continue to use her [facility] until her upcoming post op appt. Pt verbalized understanding and will communicate this with her dialysis center.

## 2021-01-19 DIAGNOSIS — Z5181 Encounter for therapeutic drug level monitoring: Secondary | ICD-10-CM | POA: Diagnosis not present

## 2021-01-20 ENCOUNTER — Ambulatory Visit: Payer: Medicare Other

## 2021-01-25 ENCOUNTER — Ambulatory Visit: Payer: Medicare Other

## 2021-02-02 DIAGNOSIS — Z23 Encounter for immunization: Secondary | ICD-10-CM | POA: Diagnosis not present

## 2021-02-02 DIAGNOSIS — N2581 Secondary hyperparathyroidism of renal origin: Secondary | ICD-10-CM | POA: Diagnosis not present

## 2021-02-02 DIAGNOSIS — E1129 Type 2 diabetes mellitus with other diabetic kidney complication: Secondary | ICD-10-CM | POA: Diagnosis not present

## 2021-02-02 DIAGNOSIS — Z992 Dependence on renal dialysis: Secondary | ICD-10-CM | POA: Diagnosis not present

## 2021-02-02 DIAGNOSIS — R110 Nausea: Secondary | ICD-10-CM | POA: Diagnosis not present

## 2021-02-02 DIAGNOSIS — D509 Iron deficiency anemia, unspecified: Secondary | ICD-10-CM | POA: Diagnosis not present

## 2021-02-02 DIAGNOSIS — N186 End stage renal disease: Secondary | ICD-10-CM | POA: Diagnosis not present

## 2021-02-02 DIAGNOSIS — D631 Anemia in chronic kidney disease: Secondary | ICD-10-CM | POA: Diagnosis not present

## 2021-02-02 DIAGNOSIS — T8610 Unspecified complication of kidney transplant: Secondary | ICD-10-CM | POA: Diagnosis not present

## 2021-02-08 ENCOUNTER — Other Ambulatory Visit: Payer: Self-pay

## 2021-02-08 ENCOUNTER — Ambulatory Visit (INDEPENDENT_AMBULATORY_CARE_PROVIDER_SITE_OTHER): Payer: Medicare Other | Admitting: Physician Assistant

## 2021-02-08 VITALS — BP 187/91 | HR 84 | Temp 98.6°F | Resp 20 | Ht 62.0 in | Wt 126.7 lb

## 2021-02-08 DIAGNOSIS — N186 End stage renal disease: Secondary | ICD-10-CM

## 2021-02-08 NOTE — Progress Notes (Signed)
POST OPERATIVE DIALYSIS ACCESS OFFICE NOTE    CC:  F/u for dialysis access surgery  HPI:  This is a 40 y.o. 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 Carlis Abbott on 12/08/2020.  This was performed due to a history of several months difficulty accessing her fistula likely due to tortuosity.  She also underwent placement of right internal jugular TDC.  She was evaluated one month ago due to significant post-op pain. Her pain has improved, but she has one area that is exquisitely tender to touch> no fever or chills. She also states her Hd treatment team is "not comfortable" yet with accessing her fistual  She is status post right brachiocephalic fistula placed in 2013 by Dr. Donnetta Hutching.    Dialysis days:  MWF Dialysis center:  [facility]  No Known Allergies  Current Outpatient Medications  Medication Sig Dispense Refill  . acetaminophen (TYLENOL) 500 MG tablet Take 1,000 mg by mouth every 6 (six) hours as needed for mild pain.    Marland Kitchen amLODipine (NORVASC) 10 MG tablet Take 10 mg by mouth daily.    Marland Kitchen aspirin EC 81 MG tablet Take 81 mg by mouth every morning.    . calcium acetate (PHOSLO) 667 MG capsule Take 2,001 mg by mouth 3 (three) times daily with meals. Take 1334 mg with a snack    . cycloSPORINE modified (NEORAL) 100 MG capsule Take 200 mg by mouth 2 (two) times daily.    . cycloSPORINE modified (NEORAL) 25 MG capsule Take 25 mg by mouth daily. Take with 100 mg during the day    . furosemide (LASIX) 20 MG tablet Take 40 mg by mouth See admin instructions. Take on tues, thurs, Saturday and sunday    . heparin 1000 unit/mL SOLN injection Heparin Sodium (Porcine) 1,000 Units/mL Catheter Lock Arterial    . labetalol (NORMODYNE) 200 MG tablet Take 400 mg by mouth 2 (two) times daily.    Marland Kitchen lidocaine-prilocaine (EMLA) cream Apply 1 application topically daily as needed (pain).    Marland Kitchen losartan (COZAAR) 100 MG tablet Take 1 tablet by  mouth at bedtime as directed    . Methoxy PEG-Epoetin Beta (MIRCERA IJ) Mircera    . mycophenolate (MYFORTIC) 180 MG EC tablet Take 540 mg by mouth 2 (two) times daily.    . predniSONE (DELTASONE) 5 MG tablet Take 15 mg by mouth daily.  5  . promethazine (PHENERGAN) 25 MG suppository Place 1 suppository (25 mg total) rectally every 6 (six) hours as needed for nausea or vomiting. 12 each 0  . zolpidem (AMBIEN) 10 MG tablet Take 10 mg by mouth at bedtime.     No current facility-administered medications for this visit.     ROS:  See HPI  BP (!) 187/91 (BP Location: Left Arm, Patient Position: Sitting, Cuff Size: Normal)   Pulse 84   Temp 98.6 F (37 C) (Temporal)   Resp 20   Ht 5\' 2"  (1.575 m)   Wt 126 lb 11.2 oz (57.5 kg)   SpO2 96%   BMI 23.17 kg/m    Physical Exam:  General appearance: awake, alert in NAD Chest: catheter site dressing dry and intact. Respirations: unlabored; no dyspnea at rest Left upper extremity: Hand is warm with 5/5 grip strength. Motor function and sensation intact. Fistula is pulsatile to palpation and auscultation. 2+ radial pulse. Incision(s): Healed with keloid formation.  Assessment/Plan:  s/p  revision of right arm  AV fistula with aneurysm resection and new primary end-to-end anastomosis approximately 2 months ago.  Her fistula is pulsatile and she has point tenderness along the fistula at the midpoint of her right upper arm.  No signs or symptoms of steal syndrome.  Will make arrangements for fistulogram on a nondialysis day.   Barbie Banner, PA-C 02/08/2021 12:14 PM [facility] [phone]  Clinic MD: Dr. Carlis Abbott

## 2021-02-16 DIAGNOSIS — Z5181 Encounter for therapeutic drug level monitoring: Secondary | ICD-10-CM | POA: Diagnosis not present

## 2021-02-17 ENCOUNTER — Other Ambulatory Visit: Payer: Self-pay

## 2021-02-17 ENCOUNTER — Encounter (HOSPITAL_COMMUNITY)
Admission: RE | Disposition: A | Discharge status: Home / Self Care | Payer: Self-pay | Source: Home / Self Care | Attending: Vascular Surgery

## 2021-02-17 ENCOUNTER — Encounter (HOSPITAL_COMMUNITY): Payer: Self-pay | Admitting: Vascular Surgery

## 2021-02-17 ENCOUNTER — Ambulatory Visit (HOSPITAL_COMMUNITY)
Admission: RE | Admit: 2021-02-17 | Discharge: 2021-02-17 | Disposition: A | Discharge status: Home / Self Care | Payer: Medicare Other | Attending: Vascular Surgery | Admitting: Vascular Surgery

## 2021-02-17 DIAGNOSIS — Z79899 Other long term (current) drug therapy: Secondary | ICD-10-CM | POA: Insufficient documentation

## 2021-02-17 DIAGNOSIS — Y841 Kidney dialysis as the cause of abnormal reaction of the patient, or of later complication, without mention of misadventure at the time of the procedure: Secondary | ICD-10-CM | POA: Diagnosis not present

## 2021-02-17 DIAGNOSIS — Z992 Dependence on renal dialysis: Secondary | ICD-10-CM | POA: Insufficient documentation

## 2021-02-17 DIAGNOSIS — N186 End stage renal disease: Secondary | ICD-10-CM | POA: Diagnosis not present

## 2021-02-17 DIAGNOSIS — Z7982 Long term (current) use of aspirin: Secondary | ICD-10-CM | POA: Diagnosis not present

## 2021-02-17 DIAGNOSIS — T82510A Breakdown (mechanical) of surgically created arteriovenous fistula, initial encounter: Secondary | ICD-10-CM | POA: Insufficient documentation

## 2021-02-17 DIAGNOSIS — Z7952 Long term (current) use of systemic steroids: Secondary | ICD-10-CM | POA: Diagnosis not present

## 2021-02-17 DIAGNOSIS — Y832 Surgical operation with anastomosis, bypass or graft as the cause of abnormal reaction of the patient, or of later complication, without mention of misadventure at the time of the procedure: Secondary | ICD-10-CM | POA: Diagnosis not present

## 2021-02-17 DIAGNOSIS — T82858A Stenosis of vascular prosthetic devices, implants and grafts, initial encounter: Secondary | ICD-10-CM | POA: Diagnosis not present

## 2021-02-17 HISTORY — PX: A/V FISTULAGRAM: CATH118298

## 2021-02-17 HISTORY — PX: PERIPHERAL VASCULAR BALLOON ANGIOPLASTY: CATH118281

## 2021-02-17 LAB — POCT I-STAT, CHEM 8
BUN: 41 mg/dL — ABNORMAL HIGH (ref 6–20)
Calcium, Ion: 1.05 mmol/L — ABNORMAL LOW (ref 1.15–1.40)
Chloride: 98 mmol/L (ref 98–111)
Creatinine, Ser: 5 mg/dL — ABNORMAL HIGH (ref 0.44–1.00)
Glucose, Bld: 112 mg/dL — ABNORMAL HIGH (ref 70–99)
HCT: 23 % — ABNORMAL LOW (ref 36.0–46.0)
Hemoglobin: 7.8 g/dL — ABNORMAL LOW (ref 12.0–15.0)
Potassium: 5.2 mmol/L — ABNORMAL HIGH (ref 3.5–5.1)
Sodium: 138 mmol/L (ref 135–145)
TCO2: 32 mmol/L (ref 22–32)

## 2021-02-17 LAB — HCG, SERUM, QUALITATIVE: Preg, Serum: NEGATIVE

## 2021-02-17 SURGERY — A/V FISTULAGRAM
Anesthesia: LOCAL | Laterality: Right

## 2021-02-17 MED ORDER — SODIUM CHLORIDE 0.9% FLUSH: 3.0000 mL | INTRAVENOUS | Status: DC | PRN

## 2021-02-17 MED ORDER — MIDAZOLAM HCL 2 MG/2ML IJ SOLN
INTRAMUSCULAR | Status: AC
  Filled 2021-02-17: qty 2

## 2021-02-17 MED ORDER — IODIXANOL 320 MG/ML IV SOLN
INTRAVENOUS | Status: DC | PRN
  Administered 2021-02-17: 40 mL

## 2021-02-17 MED ORDER — HEPARIN SODIUM (PORCINE) 1000 UNIT/ML IJ SOLN
INTRAMUSCULAR | Status: DC | PRN
  Administered 2021-02-17: 3000 [IU] via INTRAVENOUS

## 2021-02-17 MED ORDER — SODIUM CHLORIDE 0.9 % IV SOLN: 250.0000 mL | INTRAVENOUS | Status: DC | PRN

## 2021-02-17 MED ORDER — ONDANSETRON HCL 4 MG/2ML IJ SOLN: 4.0000 mg | Freq: Once | INTRAMUSCULAR | Status: AC

## 2021-02-17 MED ORDER — FENTANYL CITRATE (PF) 100 MCG/2ML IJ SOLN
INTRAMUSCULAR | Status: DC | PRN
  Administered 2021-02-17 (×2): 25 ug via INTRAVENOUS

## 2021-02-17 MED ORDER — HEPARIN SODIUM (PORCINE) 1000 UNIT/ML IJ SOLN
INTRAMUSCULAR | Status: AC
  Filled 2021-02-17: qty 1

## 2021-02-17 MED ORDER — FENTANYL CITRATE (PF) 100 MCG/2ML IJ SOLN
INTRAMUSCULAR | Status: AC
  Filled 2021-02-17: qty 2

## 2021-02-17 MED ORDER — MIDAZOLAM HCL 2 MG/2ML IJ SOLN
INTRAMUSCULAR | Status: DC | PRN
  Administered 2021-02-17 (×2): 1 mg via INTRAVENOUS

## 2021-02-17 MED ORDER — HEPARIN (PORCINE) IN NACL 1000-0.9 UT/500ML-% IV SOLN
INTRAVENOUS | Status: DC | PRN
  Administered 2021-02-17: 500 mL

## 2021-02-17 MED ORDER — ONDANSETRON HCL 4 MG/2ML IJ SOLN
INTRAMUSCULAR | Status: AC
  Administered 2021-02-17: 4 mg via INTRAVENOUS
  Filled 2021-02-17: qty 2

## 2021-02-17 MED ORDER — SODIUM CHLORIDE 0.9% FLUSH: 3.0000 mL | Freq: Two times a day (BID) | INTRAVENOUS | Status: DC

## 2021-02-17 MED ORDER — LIDOCAINE HCL (PF) 1 % IJ SOLN
INTRAMUSCULAR | Status: DC | PRN
  Administered 2021-02-17: 3 mL

## 2021-02-17 SURGICAL SUPPLY — 15 items
BAG SNAP BAND KOVER 36X36 (MISCELLANEOUS) ×2 IMPLANT
CATH ANGIO 5F BER2 65CM (CATHETERS) ×2 IMPLANT
COVER DOME SNAP 22 D (MISCELLANEOUS) ×2 IMPLANT
DEVICE TORQUE .025-.038 (MISCELLANEOUS) ×2 IMPLANT
GUIDEWIRE ANGLED .035X150CM (WIRE) ×2 IMPLANT
KIT MICROPUNCTURE NIT STIFF (SHEATH) ×2 IMPLANT
PROTECTION STATION PRESSURIZED (MISCELLANEOUS) ×2
SHEATH PINNACLE R/O II 6F 4CM (SHEATH) ×2 IMPLANT
SHEATH PROBE COVER 6X72 (BAG) ×2 IMPLANT
STATION PROTECTION PRESSURIZED (MISCELLANEOUS) ×1 IMPLANT
STOPCOCK MORSE 400PSI 3WAY (MISCELLANEOUS) ×2 IMPLANT
TRAY PV CATH (CUSTOM PROCEDURE TRAY) ×2 IMPLANT
TUBING CIL FLEX 10 FLL-RA (TUBING) ×2 IMPLANT
WIRE BENTSON .035X145CM (WIRE) ×2 IMPLANT
WIRE G V18X300CM (WIRE) ×2 IMPLANT

## 2021-02-17 NOTE — H&P (Signed)
History and Physical Interval Note:  02/17/2021 8:25 AM  [name] [name]  has presented today for surgery, with the diagnosis of complication of avf.  The various methods of treatment have been discussed with the patient and family. After consideration of risks, benefits and other options for treatment, the patient has consented to  Procedure(s): A/V FISTULAGRAM (Right) as a surgical intervention.  The patient's history has been reviewed, patient examined, no change in status, stable for surgery.  I have reviewed the patient's chart and labs.  Questions were answered to the patient's satisfaction.    Right arm fistulogram  [name]  POST OPERATIVE DIALYSIS ACCESS OFFICE NOTE       CC:  F/u for dialysis access surgery   HPI:  This is a 40 y.o. 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 Carlis Abbott on 12/08/2020.  This was performed due to a history of several months difficulty accessing her fistula likely due to tortuosity.  She also underwent placement of right internal jugular TDC.  She was evaluated one month ago due to significant post-op pain. Her pain has improved, but she has one area that is exquisitely tender to touch> no fever or chills. She also states her Hd treatment team is "not comfortable" yet with accessing her fistual   She is status post right brachiocephalic fistula placed in 2013 by Dr. Donnetta Hutching.     Dialysis days:  MWF Dialysis center:  [facility]   No Known Allergies         Current Outpatient Medications  Medication Sig Dispense Refill   acetaminophen (TYLENOL) 500 MG tablet Take 1,000 mg by mouth every 6 (six) hours as needed for mild pain.       amLODipine (NORVASC) 10 MG tablet Take 10 mg by mouth daily.       aspirin EC 81 MG tablet Take 81 mg by mouth every morning.       calcium acetate (PHOSLO) 667 MG capsule Take 2,001 mg by mouth 3 (three) times daily with meals. Take 1334 mg  with a snack       cycloSPORINE modified (NEORAL) 100 MG capsule Take 200 mg by mouth 2 (two) times daily.       cycloSPORINE modified (NEORAL) 25 MG capsule Take 25 mg by mouth daily. Take with 100 mg during the day       furosemide (LASIX) 20 MG tablet Take 40 mg by mouth See admin instructions. Take on tues, thurs, Saturday and sunday       heparin 1000 unit/mL SOLN injection Heparin Sodium (Porcine) 1,000 Units/mL Catheter Lock Arterial       labetalol (NORMODYNE) 200 MG tablet Take 400 mg by mouth 2 (two) times daily.       lidocaine-prilocaine (EMLA) cream Apply 1 application topically daily as needed (pain).       losartan (COZAAR) 100 MG tablet Take 1 tablet by mouth at bedtime as directed       Methoxy PEG-Epoetin Beta (MIRCERA IJ) Mircera       mycophenolate (MYFORTIC) 180 MG EC tablet Take 540 mg by mouth 2 (two) times daily.       predniSONE (DELTASONE) 5 MG tablet Take 15 mg by mouth daily.   5   promethazine (PHENERGAN) 25 MG suppository Place 1 suppository (25 mg total) rectally every 6 (six) hours as needed for nausea or vomiting. 12 each 0   zolpidem (  AMBIEN) 10 MG tablet Take 10 mg by mouth at bedtime.        No current facility-administered medications for this visit.       ROS:  See HPI   BP (!) 187/91 (BP Location: Left Arm, Patient Position: Sitting, Cuff Size: Normal)   Pulse 84   Temp 98.6 F (37 C) (Temporal)   Resp 20   Ht 5\' 2"  (1.575 m)   Wt 126 lb 11.2 oz (57.5 kg)   SpO2 96%   BMI 23.17 kg/m      Physical Exam:   General appearance: awake, alert in NAD Chest: catheter site dressing dry and intact. Respirations: unlabored; no dyspnea at rest Left upper extremity: Hand is warm with 5/5 grip strength. Motor function and sensation intact. Fistula is pulsatile to palpation and auscultation. 2+ radial pulse. Incision(s): Healed with keloid formation.   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  Her fistula is pulsatile and she has point tenderness along the fistula at the midpoint of her right upper arm.  No signs or symptoms of steal syndrome.  Will make arrangements for fistulogram on a nondialysis day.   Barbie Banner, PA-C 02/08/2021 12:14 PM [facility] [phone]   Clinic MD: Dr. Carlis Abbott

## 2021-02-17 NOTE — Op Note (Signed)
OPERATIVE NOTE     PROCEDURE: right brachiocephalic arteriovenous fistula cannulation under ultrasound guidance right arm fistulogram including central venogram   PRE-OPERATIVE DIAGNOSIS: Malfunctioning right arteriovenous fistula   POST-OPERATIVE DIAGNOSIS: same as above   SURGEON: [name], MD   ANESTHESIA: local   ESTIMATED BLOOD LOSS: 5 cc   FINDING(S): The brachiocephalic fistula in the right arm is widely patent from the antecubitum throughout the upper arm.  The cephalic arch is occluded and the fistula is patent through collaterals that fill the axillary vein near the arch.  I attempted to cross cephalic arch occlusion unsuccessful.  Will need revision in the OR.   SPECIMEN(S):  None   CONTRAST: 40 cc   INDICATIONS: [name] [name] is a 40 y.o. female who  presents with malfunctioning right brachiocephalic arteriovenous fistula.  The patient is scheduled for right arm fistulogram.  The patient is aware the risks include but are not limited to: bleeding, infection, thrombosis of the cannulated access, and possible anaphylactic reaction to the contrast.  The patient is aware of the risks of the procedure and elects to proceed forward.   DESCRIPTION: After full informed written consent was obtained, the patient was brought back to the angiography suite and placed supine upon the angiography table.  The patient was connected to monitoring equipment.  The right arm was prepped and draped in the standard fashion for a right arm fistulogram.  Under ultrasound guidance, the right arteriovenous fistula was evaluated, it was patent, an image was saved.  It was cannulated with a micropuncture needle.  The microwire was advanced into the fistula and the needle was exchanged for the a microsheath, which was lodged 2 cm into the access.  The wire was removed and the sheath was connected to the IV extension tubing.  Hand injections were completed to image the access from the  antecubitum up to the level of axilla.  The central venous structures were also imaged by hand injections.  It appeared that the cephalic vein was occluded at the arch.  I then placed a Bentson wire and exchanged for a short 6 French sheath in the fistula.  Patient was given 3000 units IV heparin.  I then used a KMP catheter with a Glidewire as well as a V 18 and tried for a prolonged period of time to cross the cephalic arch occlusion.  I kept getting in the collaterals that were keeping this open near the arch but could not cross the occlusion into the central venous structures.  I did not think there was any stump to try and attempt any retrograde recanalization.  Will require revision in the OR with likely turn down on the axillary vein.  A 4-0 Monocryl purse-string suture was sewn around the sheath.  The sheath was removed while tying down the suture.  A sterile bandage was applied to the puncture site.   COMPLICATIONS: None   CONDITION: Stable   [name], MD [facility] [place] Office: [place]

## 2021-02-21 ENCOUNTER — Encounter (HOSPITAL_COMMUNITY): Payer: Self-pay | Admitting: Vascular Surgery

## 2021-02-24 ENCOUNTER — Other Ambulatory Visit: Payer: Self-pay

## 2021-02-24 ENCOUNTER — Encounter (HOSPITAL_COMMUNITY): Payer: Self-pay | Admitting: Vascular Surgery

## 2021-02-24 NOTE — Progress Notes (Signed)
[name] [name]  denies chest pain or shortness of breath. Patient denies any s/s of Covid in her home and is unaware of any exposures.   [name] [name] does not like to take medications on an empty stomach- she said she will take Amlodine and labetalol.

## 2021-02-24 NOTE — Anesthesia Preprocedure Evaluation (Addendum)
Anesthesia Evaluation  Patient identified by MRN, date of birth, ID band Patient awake    Reviewed: Allergy & Precautions, H&P , NPO status , Patient's Chart, lab work & pertinent test results, reviewed documented beta blocker date and time   Airway Mallampati: III  TM Distance: >3 FB Neck ROM: Full    Dental  (+) Edentulous Upper, Caps, Dental Advisory Given, Missing   Pulmonary shortness of breath and with exertion, former smoker,    Pulmonary exam normal breath sounds clear to auscultation       Cardiovascular hypertension (poorly controlled), Pt. 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   negative genitourinary   Musculoskeletal negative musculoskeletal ROS (+) narcotic dependent  Abdominal Normal abdominal exam  (+)   Peds  Hematology  (+) Blood dyscrasia, anemia , H/H 7.8/23   Anesthesia Other Findings   Reproductive/Obstetrics negative OB ROS                             Anesthesia Physical  Anesthesia Plan  ASA: 4  Anesthesia Plan: General   Post-op Pain Management:    Induction: Intravenous  PONV Risk Score and Plan: 4 or greater and Treatment may vary due to age or medical condition, Ondansetron, Dexamethasone and Midazolam  Airway Management Planned: LMA  Additional Equipment:   Intra-op Plan:   Post-operative Plan: Extubation in OR  Informed Consent: I have reviewed the patients History and Physical, chart, labs and discussed the procedure including the risks, benefits and alternatives for the proposed anesthesia with  the patient or authorized representative who has indicated his/her understanding and acceptance.     Dental advisory given  Plan Discussed with: Anesthesiologist and CRNA  Anesthesia Plan Comments: (Last dialyzed 02/24/21. K+ 5.4, baseline 5.2. BP elevated (SBP 190s), plan to give hydralazine 10mg  IV. Norton Blizzard, MD  )      Anesthesia Quick Evaluation

## 2021-02-25 ENCOUNTER — Other Ambulatory Visit: Payer: Self-pay

## 2021-02-25 ENCOUNTER — Ambulatory Visit (HOSPITAL_COMMUNITY): Payer: Medicare Other | Admitting: Physician Assistant

## 2021-02-25 ENCOUNTER — Encounter (HOSPITAL_COMMUNITY): Payer: Self-pay | Admitting: *Deleted

## 2021-02-25 ENCOUNTER — Encounter (HOSPITAL_COMMUNITY)
Admission: RE | Disposition: A | Discharge status: Home / Self Care | Payer: Self-pay | Source: Home / Self Care | Attending: Vascular Surgery

## 2021-02-25 ENCOUNTER — Telehealth: Payer: Self-pay

## 2021-02-25 ENCOUNTER — Observation Stay (HOSPITAL_COMMUNITY)
Admission: EM | Admit: 2021-02-25 | Discharge: 2021-02-26 | Disposition: A | Discharge status: Home / Self Care | Payer: Medicare Other | Attending: Vascular Surgery | Admitting: Vascular Surgery

## 2021-02-25 ENCOUNTER — Emergency Department (HOSPITAL_COMMUNITY): Payer: Medicare Other | Admitting: Certified Registered Nurse Anesthetist

## 2021-02-25 ENCOUNTER — Encounter (HOSPITAL_COMMUNITY): Payer: Self-pay | Admitting: Vascular Surgery

## 2021-02-25 ENCOUNTER — Ambulatory Visit (HOSPITAL_COMMUNITY)
Admission: RE | Admit: 2021-02-25 | Discharge: 2021-02-25 | Disposition: A | Discharge status: Home / Self Care | Payer: Medicare Other | Source: Home / Self Care | Attending: Vascular Surgery | Admitting: Vascular Surgery

## 2021-02-25 ENCOUNTER — Encounter (HOSPITAL_COMMUNITY)
Admission: EM | Disposition: A | Discharge status: Home / Self Care | Payer: Self-pay | Source: Home / Self Care | Attending: Emergency Medicine

## 2021-02-25 DIAGNOSIS — Z7952 Long term (current) use of systemic steroids: Secondary | ICD-10-CM | POA: Insufficient documentation

## 2021-02-25 DIAGNOSIS — Z992 Dependence on renal dialysis: Secondary | ICD-10-CM | POA: Diagnosis not present

## 2021-02-25 DIAGNOSIS — I97638 Postprocedural hematoma of a circulatory system organ or structure following other circulatory system procedure: Secondary | ICD-10-CM | POA: Diagnosis not present

## 2021-02-25 DIAGNOSIS — Z9483 Pancreas transplant status: Secondary | ICD-10-CM | POA: Diagnosis not present

## 2021-02-25 DIAGNOSIS — T82898A Other specified complication of vascular prosthetic devices, implants and grafts, initial encounter: Secondary | ICD-10-CM | POA: Diagnosis not present

## 2021-02-25 DIAGNOSIS — T148XXA Other injury of unspecified body region, initial encounter: Secondary | ICD-10-CM

## 2021-02-25 DIAGNOSIS — I10 Essential (primary) hypertension: Secondary | ICD-10-CM | POA: Diagnosis not present

## 2021-02-25 DIAGNOSIS — Z79899 Other long term (current) drug therapy: Secondary | ICD-10-CM | POA: Diagnosis not present

## 2021-02-25 DIAGNOSIS — Z87891 Personal history of nicotine dependence: Secondary | ICD-10-CM | POA: Insufficient documentation

## 2021-02-25 DIAGNOSIS — N186 End stage renal disease: Secondary | ICD-10-CM | POA: Insufficient documentation

## 2021-02-25 DIAGNOSIS — M7981 Nontraumatic hematoma of soft tissue: Secondary | ICD-10-CM | POA: Diagnosis not present

## 2021-02-25 DIAGNOSIS — T82590A Other mechanical complication of surgically created arteriovenous fistula, initial encounter: Secondary | ICD-10-CM | POA: Diagnosis not present

## 2021-02-25 DIAGNOSIS — Z8249 Family history of ischemic heart disease and other diseases of the circulatory system: Secondary | ICD-10-CM | POA: Diagnosis not present

## 2021-02-25 DIAGNOSIS — I82611 Acute embolism and thrombosis of superficial veins of right upper extremity: Secondary | ICD-10-CM | POA: Insufficient documentation

## 2021-02-25 DIAGNOSIS — Y832 Surgical operation with anastomosis, bypass or graft as the cause of abnormal reaction of the patient, or of later complication, without mention of misadventure at the time of the procedure: Secondary | ICD-10-CM | POA: Insufficient documentation

## 2021-02-25 DIAGNOSIS — Z7982 Long term (current) use of aspirin: Secondary | ICD-10-CM | POA: Diagnosis not present

## 2021-02-25 DIAGNOSIS — M79601 Pain in right arm: Secondary | ICD-10-CM | POA: Diagnosis present

## 2021-02-25 DIAGNOSIS — T82510A Breakdown (mechanical) of surgically created arteriovenous fistula, initial encounter: Secondary | ICD-10-CM | POA: Diagnosis not present

## 2021-02-25 DIAGNOSIS — I120 Hypertensive chronic kidney disease with stage 5 chronic kidney disease or end stage renal disease: Secondary | ICD-10-CM | POA: Diagnosis not present

## 2021-02-25 DIAGNOSIS — Z95828 Presence of other vascular implants and grafts: Secondary | ICD-10-CM | POA: Diagnosis not present

## 2021-02-25 DIAGNOSIS — S40021A Contusion of right upper arm, initial encounter: Secondary | ICD-10-CM | POA: Diagnosis not present

## 2021-02-25 DIAGNOSIS — Z940 Kidney transplant status: Secondary | ICD-10-CM | POA: Insufficient documentation

## 2021-02-25 DIAGNOSIS — E1122 Type 2 diabetes mellitus with diabetic chronic kidney disease: Secondary | ICD-10-CM | POA: Insufficient documentation

## 2021-02-25 DIAGNOSIS — D631 Anemia in chronic kidney disease: Secondary | ICD-10-CM | POA: Diagnosis not present

## 2021-02-25 HISTORY — PX: REVISON OF ARTERIOVENOUS FISTULA: SHX6074

## 2021-02-25 HISTORY — DX: End stage renal disease: N18.6

## 2021-02-25 HISTORY — PX: HEMATOMA EVACUATION: SHX5118

## 2021-02-25 LAB — POCT I-STAT, CHEM 8
BUN: 23 mg/dL — ABNORMAL HIGH (ref 6–20)
Calcium, Ion: 1.15 mmol/L (ref 1.15–1.40)
Chloride: 99 mmol/L (ref 98–111)
Creatinine, Ser: 3.9 mg/dL — ABNORMAL HIGH (ref 0.44–1.00)
Glucose, Bld: 139 mg/dL — ABNORMAL HIGH (ref 70–99)
HCT: 26 % — ABNORMAL LOW (ref 36.0–46.0)
Hemoglobin: 8.8 g/dL — ABNORMAL LOW (ref 12.0–15.0)
Potassium: 5.4 mmol/L — ABNORMAL HIGH (ref 3.5–5.1)
Sodium: 137 mmol/L (ref 135–145)
TCO2: 30 mmol/L (ref 22–32)

## 2021-02-25 LAB — CBC
HCT: 26.9 % — ABNORMAL LOW (ref 36.0–46.0)
Hemoglobin: 8.7 g/dL — ABNORMAL LOW (ref 12.0–15.0)
MCH: 31.8 pg (ref 26.0–34.0)
MCHC: 32.3 g/dL (ref 30.0–36.0)
MCV: 98.2 fL (ref 80.0–100.0)
Platelets: 145 10*3/uL — ABNORMAL LOW (ref 150–400)
RBC: 2.74 MIL/uL — ABNORMAL LOW (ref 3.87–5.11)
RDW: 14.5 % (ref 11.5–15.5)
WBC: 4.9 10*3/uL (ref 4.0–10.5)
nRBC: 0 % (ref 0.0–0.2)

## 2021-02-25 LAB — HCG, SERUM, QUALITATIVE: Preg, Serum: NEGATIVE

## 2021-02-25 LAB — GLUCOSE, CAPILLARY: Glucose-Capillary: 119 mg/dL — ABNORMAL HIGH (ref 70–99)

## 2021-02-25 SURGERY — REVISON OF ARTERIOVENOUS FISTULA
Anesthesia: General | Laterality: Right

## 2021-02-25 SURGERY — EVACUATION HEMATOMA
Anesthesia: General | Site: Chest | Laterality: Right

## 2021-02-25 MED ORDER — PROPOFOL 10 MG/ML IV BOLUS
INTRAVENOUS | Status: AC
  Filled 2021-02-25: qty 20

## 2021-02-25 MED ORDER — LABETALOL HCL 300 MG PO TABS
600.0000 mg | ORAL_TABLET | Freq: Two times a day (BID) | ORAL | Status: DC
  Administered 2021-02-25 – 2021-02-26 (×2): 600 mg via ORAL
  Filled 2021-02-25 (×2): qty 2

## 2021-02-25 MED ORDER — ORAL CARE MOUTH RINSE
15.0000 mL | Freq: Once | OROMUCOSAL | Status: AC
Start: 2021-02-25 — End: 2021-02-25

## 2021-02-25 MED ORDER — SCOPOLAMINE 1 MG/3DAYS TD PT72
MEDICATED_PATCH | TRANSDERMAL | Status: AC
  Filled 2021-02-25: qty 1

## 2021-02-25 MED ORDER — HYDROMORPHONE HCL 1 MG/ML IJ SOLN
INTRAMUSCULAR | Status: DC | PRN
  Administered 2021-02-25: .5 mg via INTRAVENOUS

## 2021-02-25 MED ORDER — CHLORHEXIDINE GLUCONATE 4 % EX LIQD: 60.0000 mL | Freq: Once | CUTANEOUS | Status: DC

## 2021-02-25 MED ORDER — METOPROLOL TARTRATE 5 MG/5ML IV SOLN: 2.0000 mg | INTRAVENOUS | Status: DC | PRN

## 2021-02-25 MED ORDER — PANTOPRAZOLE SODIUM 40 MG PO TBEC
40.0000 mg | DELAYED_RELEASE_TABLET | Freq: Every day | ORAL | Status: DC
  Administered 2021-02-26: 40 mg via ORAL
  Filled 2021-02-25: qty 1

## 2021-02-25 MED ORDER — CALCIUM ACETATE (PHOS BINDER) 667 MG PO CAPS
2001.0000 mg | ORAL_CAPSULE | Freq: Three times a day (TID) | ORAL | Status: DC
  Filled 2021-02-25: qty 3

## 2021-02-25 MED ORDER — FUROSEMIDE 40 MG PO TABS
40.0000 mg | ORAL_TABLET | ORAL | Status: DC
  Administered 2021-02-26: 40 mg via ORAL
  Filled 2021-02-25: qty 1

## 2021-02-25 MED ORDER — FENTANYL CITRATE (PF) 100 MCG/2ML IJ SOLN
INTRAMUSCULAR | Status: AC
  Filled 2021-02-25: qty 2

## 2021-02-25 MED ORDER — OXYCODONE HCL 5 MG PO TABS
ORAL_TABLET | ORAL | Status: AC
  Filled 2021-02-25: qty 1

## 2021-02-25 MED ORDER — HYDROMORPHONE HCL 1 MG/ML IJ SOLN
INTRAMUSCULAR | Status: AC
  Filled 2021-02-25: qty 0.5

## 2021-02-25 MED ORDER — LIDOCAINE-EPINEPHRINE 1 %-1:100000 IJ SOLN
INTRAMUSCULAR | Status: AC
  Filled 2021-02-25: qty 1

## 2021-02-25 MED ORDER — FENTANYL CITRATE (PF) 250 MCG/5ML IJ SOLN
INTRAMUSCULAR | Status: AC
  Filled 2021-02-25: qty 5

## 2021-02-25 MED ORDER — CHLORHEXIDINE GLUCONATE 0.12 % MT SOLN: 15.0000 mL | Freq: Once | OROMUCOSAL | Status: AC

## 2021-02-25 MED ORDER — HEPARIN SODIUM (PORCINE) 1000 UNIT/ML IJ SOLN
1.6000 mL | Freq: Once | INTRAMUSCULAR | Status: AC
  Administered 2021-02-25: 1600 [IU] via INTRAVENOUS
  Filled 2021-02-25: qty 1.6

## 2021-02-25 MED ORDER — PHENYLEPHRINE HCL (PRESSORS) 10 MG/ML IV SOLN
INTRAVENOUS | Status: DC | PRN
  Administered 2021-02-25 (×2): 80 ug via INTRAVENOUS

## 2021-02-25 MED ORDER — POTASSIUM CHLORIDE CRYS ER 20 MEQ PO TBCR: 20.0000 meq | EXTENDED_RELEASE_TABLET | Freq: Once | ORAL | Status: DC

## 2021-02-25 MED ORDER — ZOLPIDEM TARTRATE 5 MG PO TABS
5.0000 mg | ORAL_TABLET | Freq: Every day | ORAL | Status: DC
  Administered 2021-02-25: 5 mg via ORAL
  Filled 2021-02-25: qty 1

## 2021-02-25 MED ORDER — HYDRALAZINE HCL 20 MG/ML IJ SOLN
10.0000 mg | Freq: Once | INTRAMUSCULAR | Status: AC
  Administered 2021-02-25: 10 mg via INTRAVENOUS
  Filled 2021-02-25: qty 1

## 2021-02-25 MED ORDER — MIDAZOLAM HCL 2 MG/2ML IJ SOLN
INTRAMUSCULAR | Status: AC
  Filled 2021-02-25: qty 2

## 2021-02-25 MED ORDER — HEMOSTATIC AGENTS (NO CHARGE) OPTIME
TOPICAL | Status: DC | PRN
  Administered 2021-02-25: 1 via TOPICAL

## 2021-02-25 MED ORDER — HEPARIN SODIUM (PORCINE) 1000 UNIT/ML IJ SOLN
INTRAMUSCULAR | Status: DC | PRN
  Administered 2021-02-25: 5000 [IU] via INTRAVENOUS

## 2021-02-25 MED ORDER — FENTANYL CITRATE (PF) 100 MCG/2ML IJ SOLN
25.0000 ug | INTRAMUSCULAR | Status: DC | PRN
  Administered 2021-02-25 (×3): 50 ug via INTRAVENOUS

## 2021-02-25 MED ORDER — LIDOCAINE 2% (20 MG/ML) 5 ML SYRINGE
INTRAMUSCULAR | Status: DC | PRN
  Administered 2021-02-25: 50 mg via INTRAVENOUS

## 2021-02-25 MED ORDER — SODIUM CHLORIDE 0.9 % IV SOLN
INTRAVENOUS | Status: AC
  Filled 2021-02-25: qty 1.2

## 2021-02-25 MED ORDER — 0.9 % SODIUM CHLORIDE (POUR BTL) OPTIME
TOPICAL | Status: DC | PRN
  Administered 2021-02-25: 1000 mL

## 2021-02-25 MED ORDER — SODIUM CHLORIDE 0.9 % IV SOLN: 250.0000 mL | INTRAVENOUS | Status: DC | PRN

## 2021-02-25 MED ORDER — HYDROMORPHONE HCL 1 MG/ML IJ SOLN
INTRAMUSCULAR | Status: AC
  Filled 2021-02-25: qty 1

## 2021-02-25 MED ORDER — LABETALOL HCL 5 MG/ML IV SOLN: 10.0000 mg | INTRAVENOUS | Status: DC | PRN

## 2021-02-25 MED ORDER — HYDROCODONE-ACETAMINOPHEN 5-325 MG PO TABS: 1.0000 | ORAL_TABLET | Freq: Four times a day (QID) | ORAL | 0 refills | Status: DC | PRN

## 2021-02-25 MED ORDER — DEXAMETHASONE SODIUM PHOSPHATE 10 MG/ML IJ SOLN
INTRAMUSCULAR | Status: DC | PRN
  Administered 2021-02-25: 10 mg via INTRAVENOUS

## 2021-02-25 MED ORDER — OXYCODONE HCL 5 MG/5ML PO SOLN: 5.0000 mg | Freq: Once | ORAL | Status: AC | PRN

## 2021-02-25 MED ORDER — CYCLOSPORINE MODIFIED (NEORAL) 100 MG PO CAPS
200.0000 mg | ORAL_CAPSULE | Freq: Two times a day (BID) | ORAL | Status: DC
  Administered 2021-02-25 – 2021-02-26 (×2): 200 mg via ORAL
  Filled 2021-02-25 (×3): qty 2

## 2021-02-25 MED ORDER — ONDANSETRON HCL 4 MG/2ML IJ SOLN
INTRAMUSCULAR | Status: DC | PRN
  Administered 2021-02-25: 4 mg via INTRAVENOUS

## 2021-02-25 MED ORDER — SODIUM CHLORIDE 0.9 % IV SOLN: INTRAVENOUS | Status: DC

## 2021-02-25 MED ORDER — PROPOFOL 10 MG/ML IV BOLUS
INTRAVENOUS | Status: DC | PRN
  Administered 2021-02-25: 100 mg via INTRAVENOUS
  Administered 2021-02-25: 50 mg via INTRAVENOUS

## 2021-02-25 MED ORDER — SODIUM CHLORIDE 0.9 % IV SOLN: INTRAVENOUS | Status: DC | PRN

## 2021-02-25 MED ORDER — GUAIFENESIN-DM 100-10 MG/5ML PO SYRP: 15.0000 mL | ORAL_SOLUTION | ORAL | Status: DC | PRN

## 2021-02-25 MED ORDER — ONDANSETRON HCL 4 MG/2ML IJ SOLN
4.0000 mg | Freq: Four times a day (QID) | INTRAMUSCULAR | Status: DC | PRN
  Administered 2021-02-25 – 2021-02-26 (×2): 4 mg via INTRAVENOUS
  Filled 2021-02-25 (×2): qty 2

## 2021-02-25 MED ORDER — FENTANYL CITRATE (PF) 100 MCG/2ML IJ SOLN
INTRAMUSCULAR | Status: DC | PRN
  Administered 2021-02-25: 100 ug via INTRAVENOUS
  Administered 2021-02-25 (×3): 50 ug via INTRAVENOUS

## 2021-02-25 MED ORDER — CALCIUM ACETATE (PHOS BINDER) 667 MG PO CAPS: 1334.0000 mg | ORAL_CAPSULE | Freq: Two times a day (BID) | ORAL | Status: DC | PRN

## 2021-02-25 MED ORDER — CYCLOSPORINE MODIFIED (NEORAL) 25 MG PO CAPS
25.0000 mg | ORAL_CAPSULE | Freq: Every day | ORAL | Status: DC
  Filled 2021-02-25: qty 1

## 2021-02-25 MED ORDER — AMLODIPINE BESYLATE 10 MG PO TABS
10.0000 mg | ORAL_TABLET | Freq: Every day | ORAL | Status: DC
  Administered 2021-02-25: 10 mg via ORAL
  Filled 2021-02-25: qty 1

## 2021-02-25 MED ORDER — LOSARTAN POTASSIUM 50 MG PO TABS
100.0000 mg | ORAL_TABLET | Freq: Every day | ORAL | Status: DC
  Administered 2021-02-25: 100 mg via ORAL
  Filled 2021-02-25: qty 2

## 2021-02-25 MED ORDER — SCOPOLAMINE 1 MG/3DAYS TD PT72
MEDICATED_PATCH | TRANSDERMAL | Status: DC | PRN
  Administered 2021-02-25: 1 via TRANSDERMAL

## 2021-02-25 MED ORDER — ALUM & MAG HYDROXIDE-SIMETH 200-200-20 MG/5ML PO SUSP: 15.0000 mL | ORAL | Status: DC | PRN

## 2021-02-25 MED ORDER — SODIUM ZIRCONIUM CYCLOSILICATE 10 G PO PACK
10.0000 g | PACK | Freq: Every day | ORAL | Status: DC
  Administered 2021-02-26 (×2): 10 g via ORAL
  Filled 2021-02-25 (×3): qty 1

## 2021-02-25 MED ORDER — HYDROMORPHONE HCL 1 MG/ML IJ SOLN
1.0000 mg | Freq: Once | INTRAMUSCULAR | Status: AC
  Administered 2021-02-25: 1 mg via INTRAVENOUS

## 2021-02-25 MED ORDER — SODIUM CHLORIDE 0.9 % IV SOLN
INTRAVENOUS | Status: DC | PRN
  Administered 2021-02-25: 500 mL

## 2021-02-25 MED ORDER — CHLORHEXIDINE GLUCONATE 0.12 % MT SOLN
15.0000 mL | Freq: Once | OROMUCOSAL | Status: AC
  Administered 2021-02-25: 15 mL via OROMUCOSAL
  Filled 2021-02-25: qty 15

## 2021-02-25 MED ORDER — PROPOFOL 10 MG/ML IV BOLUS
INTRAVENOUS | Status: DC | PRN
  Administered 2021-02-25: 150 mg via INTRAVENOUS

## 2021-02-25 MED ORDER — OXYCODONE HCL 5 MG PO TABS
5.0000 mg | ORAL_TABLET | Freq: Once | ORAL | Status: AC | PRN
  Administered 2021-02-25: 5 mg via ORAL

## 2021-02-25 MED ORDER — LIDOCAINE HCL 1 % IJ SOLN
INTRAMUSCULAR | Status: DC | PRN
  Administered 2021-02-25: 30 mL

## 2021-02-25 MED ORDER — DEXAMETHASONE SODIUM PHOSPHATE 10 MG/ML IJ SOLN
INTRAMUSCULAR | Status: DC | PRN
  Administered 2021-02-25: 4 mg via INTRAVENOUS

## 2021-02-25 MED ORDER — SUCCINYLCHOLINE CHLORIDE 20 MG/ML IJ SOLN
INTRAMUSCULAR | Status: DC | PRN
  Administered 2021-02-25: 80 mg via INTRAVENOUS

## 2021-02-25 MED ORDER — MIDAZOLAM HCL 2 MG/2ML IJ SOLN
INTRAMUSCULAR | Status: DC | PRN
  Administered 2021-02-25: 2 mg via INTRAVENOUS

## 2021-02-25 MED ORDER — ASPIRIN EC 81 MG PO TBEC
81.0000 mg | DELAYED_RELEASE_TABLET | Freq: Every morning | ORAL | Status: DC
  Administered 2021-02-26: 81 mg via ORAL
  Filled 2021-02-25: qty 1

## 2021-02-25 MED ORDER — HYDROMORPHONE HCL 1 MG/ML IJ SOLN
1.0000 mg | Freq: Once | INTRAMUSCULAR | Status: AC
  Administered 2021-02-25: 1 mg via INTRAVENOUS
  Filled 2021-02-25: qty 1

## 2021-02-25 MED ORDER — HYDROCODONE-ACETAMINOPHEN 5-325 MG PO TABS
1.0000 | ORAL_TABLET | ORAL | Status: DC | PRN
  Administered 2021-02-25: 2 via ORAL
  Filled 2021-02-25 (×2): qty 2

## 2021-02-25 MED ORDER — HYDROMORPHONE HCL 1 MG/ML IJ SOLN
0.5000 mg | INTRAMUSCULAR | Status: DC | PRN
  Administered 2021-02-25 – 2021-02-26 (×5): 1 mg via INTRAVENOUS
  Filled 2021-02-25 (×6): qty 1

## 2021-02-25 MED ORDER — LIDOCAINE HCL (CARDIAC) PF 100 MG/5ML IV SOSY
PREFILLED_SYRINGE | INTRAVENOUS | Status: DC | PRN
Start: 2021-02-25 — End: 2021-02-26
  Administered 2021-02-25: 40 mg via INTRAVENOUS

## 2021-02-25 MED ORDER — PHENOL 1.4 % MT LIQD: 1.0000 | OROMUCOSAL | Status: DC | PRN

## 2021-02-25 MED ORDER — ONDANSETRON HCL 4 MG/2ML IJ SOLN: 4.0000 mg | Freq: Once | INTRAMUSCULAR | Status: DC | PRN

## 2021-02-25 MED ORDER — CHLORHEXIDINE GLUCONATE 0.12 % MT SOLN
OROMUCOSAL | Status: AC
  Administered 2021-02-25: 15 mL via OROMUCOSAL
  Filled 2021-02-25: qty 15

## 2021-02-25 MED ORDER — CHLORHEXIDINE GLUCONATE CLOTH 2 % EX PADS: 6.0000 | MEDICATED_PAD | Freq: Every day | CUTANEOUS | Status: DC

## 2021-02-25 MED ORDER — HYDRALAZINE HCL 20 MG/ML IJ SOLN: 5.0000 mg | INTRAMUSCULAR | Status: DC | PRN

## 2021-02-25 MED ORDER — ONDANSETRON HCL 4 MG PO TABS: 4.0000 mg | ORAL_TABLET | Freq: Two times a day (BID) | ORAL | Status: DC | PRN

## 2021-02-25 MED ORDER — SODIUM CHLORIDE 0.9% FLUSH
3.0000 mL | Freq: Two times a day (BID) | INTRAVENOUS | Status: DC
  Administered 2021-02-25: 3 mL via INTRAVENOUS

## 2021-02-25 MED ORDER — SODIUM CHLORIDE 0.9% FLUSH: 3.0000 mL | INTRAVENOUS | Status: DC | PRN

## 2021-02-25 MED ORDER — HYDROMORPHONE HCL 1 MG/ML IJ SOLN
0.2500 mg | INTRAMUSCULAR | Status: DC | PRN
  Administered 2021-02-25 (×2): 0.5 mg via INTRAVENOUS

## 2021-02-25 MED ORDER — ORAL CARE MOUTH RINSE: 15.0000 mL | Freq: Once | OROMUCOSAL | Status: AC

## 2021-02-25 MED ORDER — FENTANYL CITRATE (PF) 100 MCG/2ML IJ SOLN
25.0000 ug | INTRAMUSCULAR | Status: DC | PRN
  Administered 2021-02-25 (×2): 50 ug via INTRAVENOUS

## 2021-02-25 MED ORDER — DIPHENHYDRAMINE HCL 25 MG PO CAPS
50.0000 mg | ORAL_CAPSULE | Freq: Every evening | ORAL | Status: DC | PRN
  Filled 2021-02-25: qty 2

## 2021-02-25 MED ORDER — MIDAZOLAM HCL 5 MG/5ML IJ SOLN
INTRAMUSCULAR | Status: DC | PRN
  Administered 2021-02-25: 2 mg via INTRAVENOUS

## 2021-02-25 MED ORDER — ACETAMINOPHEN 500 MG PO TABS: 1000.0000 mg | ORAL_TABLET | Freq: Four times a day (QID) | ORAL | Status: DC | PRN

## 2021-02-25 MED ORDER — CEFAZOLIN SODIUM-DEXTROSE 2-4 GM/100ML-% IV SOLN
2.0000 g | INTRAVENOUS | Status: AC
  Administered 2021-02-25: 2 g via INTRAVENOUS
  Filled 2021-02-25: qty 100

## 2021-02-25 MED ORDER — PREDNISONE 5 MG PO TABS
15.0000 mg | ORAL_TABLET | Freq: Every day | ORAL | Status: DC
  Administered 2021-02-26: 15 mg via ORAL
  Filled 2021-02-25: qty 1

## 2021-02-25 MED ORDER — MYCOPHENOLATE SODIUM 180 MG PO TBEC
540.0000 mg | DELAYED_RELEASE_TABLET | Freq: Two times a day (BID) | ORAL | Status: DC
  Administered 2021-02-25 – 2021-02-26 (×2): 540 mg via ORAL
  Filled 2021-02-25 (×3): qty 3

## 2021-02-25 MED ORDER — FENTANYL CITRATE (PF) 250 MCG/5ML IJ SOLN
INTRAMUSCULAR | Status: DC | PRN
  Administered 2021-02-25 (×4): 50 ug via INTRAVENOUS

## 2021-02-25 SURGICAL SUPPLY — 44 items
ADH SKN CLS APL DERMABOND .7 (GAUZE/BANDAGES/DRESSINGS) ×1
AGENT HMST SPONGE THK3/8 (HEMOSTASIS)
ARMBAND PINK RESTRICT EXTREMIT (MISCELLANEOUS) ×3 IMPLANT
BIOPATCH RED 1 DISK 7.0 (GAUZE/BANDAGES/DRESSINGS) ×2 IMPLANT
BIOPATCH RED 1IN DISK 7.0MM (GAUZE/BANDAGES/DRESSINGS) ×1
CANISTER SUCT 3000ML PPV (MISCELLANEOUS) ×3 IMPLANT
CATH EMB 4FR 40CM (CATHETERS) ×3 IMPLANT
CLIP VESOCCLUDE MED 6/CT (CLIP) ×3 IMPLANT
CLIP VESOCCLUDE SM WIDE 6/CT (CLIP) ×3 IMPLANT
COVER PROBE W GEL 5X96 (DRAPES) IMPLANT
DECANTER SPIKE VIAL GLASS SM (MISCELLANEOUS) ×3 IMPLANT
DERMABOND ADVANCED (GAUZE/BANDAGES/DRESSINGS) ×2
DERMABOND ADVANCED .7 DNX12 (GAUZE/BANDAGES/DRESSINGS) ×1 IMPLANT
DRAPE HALF SHEET 40X57 (DRAPES) ×3 IMPLANT
DRSG COVADERM 4X6 (GAUZE/BANDAGES/DRESSINGS) ×3 IMPLANT
ELECT REM PT RETURN 9FT ADLT (ELECTROSURGICAL) ×3
ELECTRODE REM PT RTRN 9FT ADLT (ELECTROSURGICAL) ×1 IMPLANT
GLOVE SRG 8 PF TXTR STRL LF DI (GLOVE) ×1 IMPLANT
GLOVE SURG ENC MOIS LTX SZ7.5 (GLOVE) ×3 IMPLANT
GLOVE SURG MICRO LTX SZ6.5 (GLOVE) ×3 IMPLANT
GLOVE SURG UNDER POLY LF SZ7.5 (GLOVE) ×3 IMPLANT
GLOVE SURG UNDER POLY LF SZ8 (GLOVE) ×3
GOWN STRL REUS W/ TWL LRG LVL3 (GOWN DISPOSABLE) ×3 IMPLANT
GOWN STRL REUS W/ TWL XL LVL3 (GOWN DISPOSABLE) ×1 IMPLANT
GOWN STRL REUS W/TWL LRG LVL3 (GOWN DISPOSABLE) ×9
GOWN STRL REUS W/TWL XL LVL3 (GOWN DISPOSABLE) ×3
HEMOSTAT SPONGE AVITENE ULTRA (HEMOSTASIS) IMPLANT
KIT BASIN OR (CUSTOM PROCEDURE TRAY) ×3 IMPLANT
KIT TURNOVER KIT B (KITS) ×3 IMPLANT
LOOP VESSEL MAXI BLUE (MISCELLANEOUS) ×3 IMPLANT
NS IRRIG 1000ML POUR BTL (IV SOLUTION) ×3 IMPLANT
PACK CV ACCESS (CUSTOM PROCEDURE TRAY) ×3 IMPLANT
PAD ARMBOARD 7.5X6 YLW CONV (MISCELLANEOUS) ×6 IMPLANT
STAPLER VISISTAT 35W (STAPLE) IMPLANT
SUT MNCRL AB 4-0 PS2 18 (SUTURE) ×3 IMPLANT
SUT PROLENE 5 0 C 1 24 (SUTURE) ×3 IMPLANT
SUT PROLENE 6 0 BV (SUTURE) ×3 IMPLANT
SUT PROLENE 7 0 BV 1 (SUTURE) IMPLANT
SUT SILK 2 0 SH (SUTURE) ×3 IMPLANT
SUT VIC AB 3-0 SH 27 (SUTURE) ×6
SUT VIC AB 3-0 SH 27X BRD (SUTURE) ×2 IMPLANT
TOWEL GREEN STERILE (TOWEL DISPOSABLE) ×3 IMPLANT
UNDERPAD 30X36 HEAVY ABSORB (UNDERPADS AND DIAPERS) ×3 IMPLANT
WATER STERILE IRR 1000ML POUR (IV SOLUTION) ×3 IMPLANT

## 2021-02-25 SURGICAL SUPPLY — 46 items
BNDG ELASTIC 4X5.8 VLCR STR LF (GAUZE/BANDAGES/DRESSINGS) IMPLANT
BNDG ELASTIC 6X5.8 VLCR STR LF (GAUZE/BANDAGES/DRESSINGS) IMPLANT
BNDG GAUZE ELAST 4 BULKY (GAUZE/BANDAGES/DRESSINGS) IMPLANT
CANISTER SUCT 3000ML PPV (MISCELLANEOUS) ×4 IMPLANT
CLIP LIGATING EXTRA MED SLVR (CLIP) ×4 IMPLANT
CLIP LIGATING EXTRA SM BLUE (MISCELLANEOUS) ×4 IMPLANT
CLIP VESOCCLUDE MED 6/CT (CLIP) ×4 IMPLANT
CLIP VESOCCLUDE SM WIDE 6/CT (CLIP) ×4 IMPLANT
COVER SURGICAL LIGHT HANDLE (MISCELLANEOUS) ×4 IMPLANT
COVER WAND RF STERILE (DRAPES) ×4 IMPLANT
DRAPE HALF SHEET 40X57 (DRAPES) ×4 IMPLANT
DRAPE IMP U-DRAPE 54X76 (DRAPES) ×8 IMPLANT
DRAPE U-SHAPE 76X120 STRL (DRAPES) IMPLANT
ELECT REM PT RETURN 9FT ADLT (ELECTROSURGICAL) ×4
ELECTRODE REM PT RTRN 9FT ADLT (ELECTROSURGICAL) ×2 IMPLANT
GAUZE SPONGE 4X4 12PLY STRL (GAUZE/BANDAGES/DRESSINGS) ×4 IMPLANT
GAUZE XEROFORM 5X9 LF (GAUZE/BANDAGES/DRESSINGS) IMPLANT
GLOVE SURG ENC MOIS LTX SZ7.5 (GLOVE) ×4 IMPLANT
GOWN STRL REUS W/ TWL LRG LVL3 (GOWN DISPOSABLE) ×4 IMPLANT
GOWN STRL REUS W/ TWL XL LVL3 (GOWN DISPOSABLE) ×2 IMPLANT
GOWN STRL REUS W/TWL LRG LVL3 (GOWN DISPOSABLE) ×4
GOWN STRL REUS W/TWL XL LVL3 (GOWN DISPOSABLE) ×2
HANDPIECE INTERPULSE COAX TIP (DISPOSABLE)
HEMOSTAT HEMOBLAST BELLOWS (HEMOSTASIS) ×4 IMPLANT
IV NS IRRIG 3000ML ARTHROMATIC (IV SOLUTION) ×4 IMPLANT
KIT BASIN OR (CUSTOM PROCEDURE TRAY) ×4 IMPLANT
KIT TURNOVER KIT B (KITS) ×4 IMPLANT
NS IRRIG 1000ML POUR BTL (IV SOLUTION) ×4 IMPLANT
PACK CV ACCESS (CUSTOM PROCEDURE TRAY) IMPLANT
PACK GENERAL/GYN (CUSTOM PROCEDURE TRAY) ×4 IMPLANT
PACK UNIVERSAL I (CUSTOM PROCEDURE TRAY) IMPLANT
PAD ARMBOARD 7.5X6 YLW CONV (MISCELLANEOUS) ×8 IMPLANT
PULSAVAC PLUS IRRIG FAN TIP (DISPOSABLE)
SET HNDPC FAN SPRY TIP SCT (DISPOSABLE) IMPLANT
STAPLER VISISTAT 35W (STAPLE) ×4 IMPLANT
SUT ETHILON 3 0 PS 1 (SUTURE) IMPLANT
SUT PROLENE 6 0 BV (SUTURE) ×8 IMPLANT
SUT VIC AB 2-0 CT1 27 (SUTURE) ×2
SUT VIC AB 2-0 CT1 TAPERPNT 27 (SUTURE) ×2 IMPLANT
SUT VIC AB 2-0 CTX 36 (SUTURE) IMPLANT
SUT VIC AB 3-0 SH 27 (SUTURE)
SUT VIC AB 3-0 SH 27X BRD (SUTURE) IMPLANT
SUT VICRYL 4-0 PS2 18IN ABS (SUTURE) IMPLANT
TIP FAN IRRIG PULSAVAC PLUS (DISPOSABLE) IMPLANT
TOWEL GREEN STERILE (TOWEL DISPOSABLE) ×4 IMPLANT
WATER STERILE IRR 1000ML POUR (IV SOLUTION) ×4 IMPLANT

## 2021-02-25 NOTE — H&P (Signed)
H+P  Reason for Consult:  pain s/p right upper avf turndown Referring Physician:  Dr. Kathrynn Humble MRN #:  [id number]  History of Present Illness:  40 y.o. female with esrd underwent turndown of right cephalic vein avf. Upon arriving home she developed severe pain radiating to her back and posteriorly down her right arm. She has tolerated other procedures well in the past. Currently crying in pain.   Past Medical History:  Diagnosis Date   Anemia    Anxiety    ESRD (end stage renal disease) ([place])    Henmodialysis   Gastroparesis    Hypertension    Narcotic abuse ([place])    Non compliance with medical treatment    Renal disorder 2015   transplant   Vision loss     Past Surgical History:  Procedure Laterality Date   A/V FISTULAGRAM Right 02/17/2021   Procedure: A/V FISTULAGRAM;  Surgeon: [name], MD;  Location: [facility];  Service: Cardiovascular;  Laterality: Right;   AV FISTULA PLACEMENT  11/17/2011   Procedure: ARTERIOVENOUS (AV) FISTULA CREATION;  Surgeon: Rosetta Posner, MD;  Location: [facility];  Service: Vascular;  Laterality: Right;   EYE SURGERY     INSERTION OF DIALYSIS CATHETER Right 12/08/2020   Procedure: INSERTION OF TUNNELED DIALYSIS CATHETER;  Surgeon: [name], MD;  Location: [facility];  Service: Vascular;  Laterality: Right;   KIDNEY TRANSPLANT     NEPHRECTOMY TRANSPLANTED ORGAN     pancrease transplant     PERIPHERAL VASCULAR BALLOON ANGIOPLASTY Right 02/17/2021   Procedure: PERIPHERAL VASCULAR BALLOON ANGIOPLASTY;  Surgeon: [name], MD;  Location: [facility];  Service: Cardiovascular;  Laterality: Right;   REFRACTIVE SURGERY     REVISON OF ARTERIOVENOUS FISTULA Right 12/08/2020   Procedure: RIGHT ARM ARTERIOVENOUS FISTULA REVISION AND RESECTION;  Surgeon: [name], MD;  Location: [facility];  Service: Vascular;  Laterality: Right;    No Known Allergies  Prior to Admission medications   Medication Sig Start Date End  Date Taking? Authorizing Provider  acetaminophen (TYLENOL) 500 MG tablet Take 1,000 mg by mouth every 6 (six) hours as needed for mild pain.    [provider]  amLODipine (NORVASC) 10 MG tablet Take 10 mg by mouth at bedtime. 09/19/18   [provider]  aspirin EC 81 MG tablet Take 81 mg by mouth every morning.    [provider]  calcium acetate (PHOSLO) 667 MG capsule Take 1,334-2,001 mg by mouth See admin instructions. Take 2001 mg with each meal and 1334 mg with each snack 05/04/20   [provider]  cycloSPORINE modified (NEORAL) 100 MG capsule Take 200 mg by mouth 2 (two) times daily.    [provider]  cycloSPORINE modified (NEORAL) 25 MG capsule Take 25 mg by mouth daily.    [provider]  diphenhydrAMINE HCl, Sleep, (ZZZQUIL) 25 MG CAPS Take 50 mg by mouth at bedtime as needed (sleep).    [provider]  furosemide (LASIX) 20 MG tablet Take 40 mg by mouth 4 (four) times a week. Take 40 mg on Tues, Thurs, Sat, and Sun (non-dialysis days) 09/12/19   [provider]  heparin 1000 unit/mL SOLN injection Heparin Sodium (Porcine) 1,000 Units/mL Catheter Lock Arterial 12/22/20 12/21/21  [provider]  HYDROcodone-acetaminophen (NORCO) 5-325 MG tablet Take 1 tablet by mouth every 6 (six) hours as needed for moderate pain. 02/25/21   Rhyne, Hulen Shouts, PA-C  labetalol (NORMODYNE) 200  MG tablet Take 600 mg by mouth 2 (two) times daily. 09/12/19   [provider]  lidocaine-prilocaine (EMLA) cream Apply 1 application topically daily as needed (port access). 07/09/20   [provider]  losartan (COZAAR) 100 MG tablet Take 100 mg by mouth at bedtime. 02/02/21   [provider]  Methoxy PEG-Epoetin Beta (MIRCERA IJ) Mircera 01/03/21 01/02/22  [provider]  mycophenolate (MYFORTIC) 180 MG EC tablet Take 540 mg by mouth 2 (two) times daily.    [provider]  ondansetron (ZOFRAN) 4 MG  tablet Take 4 mg by mouth 2 (two) times daily as needed for nausea or vomiting.    [provider]  predniSONE (DELTASONE) 5 MG tablet Take 15 mg by mouth daily. 06/24/15   [provider]  zolpidem (AMBIEN) 10 MG tablet Take 10 mg by mouth at bedtime.    [provider]    Social History   Socioeconomic History   Marital status: Married    Spouse name: Not on file   Number of children: Not on file   Years of education: Not on file   Highest education level: Not on file  Occupational History   Not on file  Tobacco Use   Smoking status: Former    Pack years: 0.00    Types: Pipe   Smokeless tobacco: Never   Tobacco comments:    Hooka  Vaping Use   Vaping Use: Never used  Substance and Sexual Activity   Alcohol use: No   Drug use: No   Sexual activity: Not Currently  Other Topics Concern   Not on file  Social History Narrative   Worked for [employer]--as did husband. Had to leave [place] for safety reasons. Has been in [place] since 9/09.   Social Determinants of Health   Financial Resource Strain: Not on file  Food Insecurity: Not on file  Transportation Needs: Not on file  Physical Activity: Not on file  Stress: Not on file  Social Connections: Not on file  Intimate Partner Violence: Not on file     Family History  Problem Relation Age of Onset   Hypertension Father     ROS:  Pain as above  Physical Examination  Vitals:   02/25/21 1625 02/25/21 1646  BP: (!) 230/98   Pulse: 81 85  Resp: (!) 27   Temp: 98.5 F (36.9 C)   SpO2: 100% 96%   There is no height or weight on file to calculate BMI.  General:  nad Pulmonary: normal non-labored breathing, Cardiac: palpable right radial pulse Abdomen: soft Extremities: right shoulder hematoma, large, is compressible but exquisite ttp  Neurologic: motor in tact right hand  CBC    Component Value Date/Time   WBC 4.9 02/25/2021 0605   RBC 2.74 (L) 02/25/2021 0605   HGB 8.8 (L) 02/25/2021  0629   HCT 26.0 (L) 02/25/2021 0629   PLT 145 (L) 02/25/2021 0605   MCV 98.2 02/25/2021 0605   MCH 31.8 02/25/2021 0605   MCHC 32.3 02/25/2021 0605   RDW 14.5 02/25/2021 0605   LYMPHSABS 1.0 02/27/2020 1521   MONOABS 0.3 02/27/2020 1521   EOSABS 0.2 02/27/2020 1521   BASOSABS 0.0 02/27/2020 1521    BMET    Component Value Date/Time   NA 137 02/25/2021 0629   K 5.4 (H) 02/25/2021 0629   CL 99 02/25/2021 0629   CO2 20 (L) 08/02/2020 1935   GLUCOSE 139 (H) 02/25/2021 0629   BUN 23 (H)  02/25/2021 0629   CREATININE 3.90 (H) 02/25/2021 0629   CALCIUM 8.7 (L) 08/02/2020 1935   GFRNONAA 5 (L) 08/02/2020 1935   GFRAA 3 (L) 05/14/2020 0223    COAGS: Lab Results  Component Value Date   INR 1.2 12/20/2018   INR 1.25 11/16/2011   INR 1.23 11/10/2011     Non-Invasive Vascular Imaging:   none  ASSESSMENT/PLAN: 40 y.o. female s/p above procedure with hematoma appears to have compression of plexus. Will plan for hematoma evacuation tonight in OR.   Jacki Couse C. Donzetta Matters, MD [facility] [place] Office: [phone] Pager: [phone]

## 2021-02-25 NOTE — Transfer of Care (Signed)
Immediate Anesthesia Transfer of Care Note  Patient: [name] [name]  Procedure(s) Performed: RIGHT ARM ARTERIOVENOUS FISTULA REVISION WITH TRANSPOSITION OF CEPHALIC VEIN ON AXILLARY VEIN (Right)  Patient Location: PACU  Anesthesia Type:General  Level of Consciousness: drowsy, patient cooperative and responds to stimulation  Airway & Oxygen Therapy: Patient Spontanous Breathing and Patient connected to nasal cannula oxygen  Post-op Assessment: Report given to RN, Post -op Vital signs reviewed and stable and Patient moving all extremities X 4  Post vital signs: Reviewed and stable  Last Vitals:  Vitals Value Taken Time  BP 134/69 02/25/21 0945  Temp 36.5 C 02/25/21 0945  Pulse 81 02/25/21 0948  Resp 15 02/25/21 0948  SpO2 96 % 02/25/21 0948  Vitals shown include unvalidated device data.  Last Pain:  Vitals:   02/25/21 0645  TempSrc:   PainSc: 0-No pain         Complications: No notable events documented.

## 2021-02-25 NOTE — Telephone Encounter (Addendum)
Patient calls back tearful, c/o pain. She says she called 911 and EMS came and two girls came out and "sat on her couch just, like - chilling." States the EMS told her not to go to the hospital "because if you go they're not gonna do nothing." Also, that they told her to take another dose of her pain medicine. Advised that if she was in that much pain she would need to return to the hospital. We could not prescribe anything stronger; some discomfort post surgery is normal. She states, "  the nerve on my arm is pressing on me - when they gave me dilaudid at the hospital, I didn't feel it, but now I feel it and it is unbearable." Instructed patient to return to the hospital for further evaluation and management. Patient verbalizes understanding.  Patient calls today to report pain in her arm s/p fistula placement this morning. Denies any issues besides painful. She sounds tearful. She would like oxycodone to be called in because hydrocodone hurts her stomach. [facility] has already [facility] her [facility]codone. Advised I could not call in additional pain medicine. She asks what else she can do. Advised her to take hydrocodone with food and if the pain became unbearable, she would need to return to ED. Patient verbalizes understanding.

## 2021-02-25 NOTE — Anesthesia Preprocedure Evaluation (Signed)
Anesthesia Evaluation  Patient identified by MRN, date of birth, ID band Patient awake    Reviewed: Allergy & Precautions, NPO status , Patient's Chart, lab work & pertinent test results, reviewed documented beta blocker date and time   Airway Mallampati: II  TM Distance: >3 FB Neck ROM: Full    Dental  (+) Dental Advisory Given, Edentulous Upper, Missing, Caps   Pulmonary shortness of breath and with exertion, former smoker,    Pulmonary exam normal breath sounds clear to auscultation       Cardiovascular hypertension, Pt. on medications and Pt. on home beta blockers Normal cardiovascular exam Rhythm:Regular Rate:Normal     Neuro/Psych PSYCHIATRIC DISORDERS Anxiety negative neurological ROS     GI/Hepatic (+)     substance abuse  , Gastroparesis    Endo/Other  diabetes, Poorly Controlled, Type 2Hypothyroidism   Renal/GU ESRF and DialysisRenal disease     Musculoskeletal negative musculoskeletal ROS (+)   Abdominal   Peds  Hematology  (+) Blood dyscrasia (Thrombocytopenia), anemia ,   Anesthesia Other Findings Day of surgery medications reviewed with the patient.  Reproductive/Obstetrics                            Anesthesia Physical Anesthesia Plan  ASA: 3 and emergent  Anesthesia Plan: General   Post-op Pain Management:    Induction: Intravenous, Rapid sequence and Cricoid pressure planned  PONV Risk Score and Plan: 3 and Dexamethasone and Ondansetron  Airway Management Planned: Oral ETT  Additional Equipment:   Intra-op Plan:   Post-operative Plan: Extubation in OR  Informed Consent: I have reviewed the patients History and Physical, chart, labs and discussed the procedure including the risks, benefits and alternatives for the proposed anesthesia with the patient or authorized representative who has indicated his/her understanding and acceptance.     Dental advisory  given  Plan Discussed with: CRNA  Anesthesia Plan Comments:         Anesthesia Quick Evaluation

## 2021-02-25 NOTE — ED Provider Notes (Addendum)
[facility] EMERGENCY DEPARTMENT Provider Note   CSN: [id number] Arrival date & time: 02/25/21  1624     History Chief Complaint  Patient presents with   Pain to R arm sx site    Sx was this AM to dialysis fistula site    [name] [name] is a 40 y.o. female.  HPI     40 year old comes in a chief complaint of severe arm pain. She has history of ESRD on HD and had revision of her right brachiocephalic AV fistula earlier today.  Patient reports that within minutes of her getting home she started having excruciating pain and swelling to the right arm.  She called the surgeon, and was advised to come to the ER.  She denies any numbness or tingling.  She is having severe pain in her shoulder area both anteriorly and posteriorly and pain radiating to her elbow.  Past Medical History:  Diagnosis Date   Anemia    Anxiety    ESRD (end stage renal disease) ([place])    Henmodialysis   Gastroparesis    Hypertension    Narcotic abuse ([place])    Non compliance with medical treatment    Renal disorder 2015   transplant   Vision loss     Patient Active Problem List   Diagnosis Date Noted   Coagulation defect, unspecified ([place]) 12/22/2020   Acute cough 10/04/2020   Allergy, unspecified, initial encounter 05/21/2020   Anaphylactic shock, unspecified, initial encounter 05/21/2020   Symptomatic anemia 02/27/2020   Shortness of breath 04/23/2019   Hypocalcemia 12/30/2018   Aortic atherosclerosis ([place]) 12/27/2018   Acute respiratory failure with hypoxia ([place]) 12/22/2018   Hyperkalemia    Bilateral pneumonia 12/21/2018   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   Immunosuppression ([place]) 12/21/2018   Chest pain 12/21/2018   CAP (community acquired pneumonia) 12/20/2018   Other staphylococcus as the cause of diseases classified elsewhere 08/16/2018   Mild protein-calorie malnutrition ([place]) 04/18/2018   Encounter for screening for respiratory tuberculosis  04/06/2018   Gout, unspecified 04/06/2018   Iron deficiency anemia, unspecified 04/06/2018   Leiomyoma of uterus, unspecified 04/06/2018   Secondary hyperparathyroidism of renal origin ([place]) 04/06/2018   Acute rejection of kidney transplant 07/11/2017   Elevated serum creatinine 06/27/2017   E. coli UTI 12/18/2014   Transplant rejection 09/03/2014   Cyst of ovary, left 09/01/2014   Immunosuppressive management encounter following kidney transplant 08/26/2014   Acne 04/28/2014   Itch of skin 04/28/2014   Diabetic gastroparesis associated with type 1 diabetes mellitus ([place]) 11/07/2013   Nausea & vomiting 11/07/2013   Lesion of spleen 20/35/5974   Metabolic acidosis 16/38/4536   Patient's noncompliance with other medical treatment and regimen 09/12/2013   Narcotic dependence ([place]) 08/31/2013   Generalized pain 08/30/2013   Hypomagnesemia 08/30/2013   Renal hematoma of right transplanted kidney 08/21/2013   Hypophosphatemia 07/28/2013   End stage renal disease ([place]) 02/06/2012   Pleural effusion, bilateral 11/16/2011   Anemia in chronic kidney disease (CKD) 11/16/2011   Pericardial effusion 11/15/2011   Fluid overload 11/15/2011   Abdominal pain, epigastric 11/15/2011   CKD (chronic kidney disease) stage 5, GFR less than 15 ml/min ([place]) 11/03/2011   Hypertension 10/27/2011   Renal failure, chronic 10/27/2011   MEDIAL EPICONDYLITIS, LEFT 12/17/2007   ABSCESS, AXILLA, LEFT 12/03/2007   Type 2 diabetes mellitus with other diabetic kidney complication ([place]) 46/80/3212    Past Surgical History:  Procedure Laterality Date   A/V FISTULAGRAM Right 02/17/2021  Procedure: A/V FISTULAGRAM;  Surgeon: [name], MD;  Location: [facility];  Service: Cardiovascular;  Laterality: Right;   AV FISTULA PLACEMENT  11/17/2011   Procedure: ARTERIOVENOUS (AV) FISTULA CREATION;  Surgeon: Rosetta Posner, MD;  Location: [facility];  Service: Vascular;  Laterality: Right;   EYE SURGERY      INSERTION OF DIALYSIS CATHETER Right 12/08/2020   Procedure: INSERTION OF TUNNELED DIALYSIS CATHETER;  Surgeon: [name], MD;  Location: [facility];  Service: Vascular;  Laterality: Right;   KIDNEY TRANSPLANT     NEPHRECTOMY TRANSPLANTED ORGAN     pancrease transplant     PERIPHERAL VASCULAR BALLOON ANGIOPLASTY Right 02/17/2021   Procedure: PERIPHERAL VASCULAR BALLOON ANGIOPLASTY;  Surgeon: [name], MD;  Location: [facility];  Service: Cardiovascular;  Laterality: Right;   REFRACTIVE SURGERY     REVISON OF ARTERIOVENOUS FISTULA Right 12/08/2020   Procedure: RIGHT ARM ARTERIOVENOUS FISTULA REVISION AND RESECTION;  Surgeon: [name], MD;  Location: [facility];  Service: Vascular;  Laterality: Right;     OB History   No obstetric history on file.     Family History  Problem Relation Age of Onset   Hypertension Father     Social History   Tobacco Use   Smoking status: Former    Pack years: 0.00    Types: Pipe   Smokeless tobacco: Never   Tobacco comments:    Hooka  Vaping Use   Vaping Use: Never used  Substance Use Topics   Alcohol use: No   Drug use: No    Home Medications Prior to Admission medications   Medication Sig Start Date End Date Taking? Authorizing Provider  acetaminophen (TYLENOL) 500 MG tablet Take 1,000 mg by mouth every 6 (six) hours as needed for mild pain.    [provider]  amLODipine (NORVASC) 10 MG tablet Take 10 mg by mouth at bedtime. 09/19/18   [provider]  aspirin EC 81 MG tablet Take 81 mg by mouth every morning.    [provider]  calcium acetate (PHOSLO) 667 MG capsule Take 1,334-2,001 mg by mouth See admin instructions. Take 2001 mg with each meal and 1334 mg with each snack 05/04/20   [provider]  cycloSPORINE modified (NEORAL) 100 MG capsule Take 200 mg by mouth 2 (two) times daily.    [provider]  cycloSPORINE modified (NEORAL) 25 MG capsule Take 25 mg by  mouth daily.    [provider]  diphenhydrAMINE HCl, Sleep, (ZZZQUIL) 25 MG CAPS Take 50 mg by mouth at bedtime as needed (sleep).    [provider]  furosemide (LASIX) 20 MG tablet Take 40 mg by mouth 4 (four) times a week. Take 40 mg on Tues, Thurs, Sat, and Sun (non-dialysis days) 09/12/19   [provider]  heparin 1000 unit/mL SOLN injection Heparin Sodium (Porcine) 1,000 Units/mL Catheter Lock Arterial 12/22/20 12/21/21  [provider]  HYDROcodone-acetaminophen (NORCO) 5-325 MG tablet Take 1 tablet by mouth every 6 (six) hours as needed for moderate pain. 02/25/21   Rhyne, Hulen Shouts, PA-C  labetalol (NORMODYNE) 200 MG tablet Take 600 mg by mouth 2 (two) times daily. 09/12/19   [provider]  lidocaine-prilocaine (EMLA) cream Apply 1 application topically daily as needed (port access). 07/09/20   [provider]  losartan (COZAAR) 100 MG tablet Take 100 mg by mouth at bedtime. 02/02/21   [provider]  Methoxy PEG-Epoetin Beta (MIRCERA  IJ) Mircera 01/03/21 01/02/22  [provider]  mycophenolate (MYFORTIC) 180 MG EC tablet Take 540 mg by mouth 2 (two) times daily.    [provider]  ondansetron (ZOFRAN) 4 MG tablet Take 4 mg by mouth 2 (two) times daily as needed for nausea or vomiting.    [provider]  predniSONE (DELTASONE) 5 MG tablet Take 15 mg by mouth daily. 06/24/15   [provider]  zolpidem (AMBIEN) 10 MG tablet Take 10 mg by mouth at bedtime.    [provider]    Allergies    Patient has no known allergies.  Review of Systems   Review of Systems  Constitutional:  Positive for activity change.  Respiratory:  Negative for shortness of breath.   Cardiovascular:  Negative for chest pain.  Neurological:  Negative for numbness and headaches.  Hematological:  Does not bruise/bleed easily.   Physical Exam Updated Vital Signs BP (!) 230/98 (BP Location: Left Arm)   Pulse 85    Temp 98.5 F (36.9 C) (Oral)   Resp (!) 27   LMP  (LMP Unknown)   SpO2 96%   Physical Exam Vitals and nursing note reviewed.  Constitutional:      Appearance: She is well-developed.  HENT:     Head: Atraumatic.  Cardiovascular:     Rate and Rhythm: Normal rate.  Pulmonary:     Effort: Pulmonary effort is normal.  Musculoskeletal:        General: Swelling and tenderness present.     Cervical back: Normal range of motion and neck supple.  Skin:    General: Skin is warm and dry.  Neurological:     Mental Status: She is alert and oriented to person, place, and time.    ED Results / Procedures / Treatments   Labs (all labs ordered are listed, but only abnormal results are displayed) Labs Reviewed - No data to display  EKG None  Radiology No results found.  Procedures Procedures   Medications Ordered in ED Medications  HYDROmorphone (DILAUDID) injection 1 mg (1 mg Intravenous Given 02/25/21 1829)    ED Course  I have reviewed the triage vital signs and the nursing notes.  Pertinent labs & imaging results that were available during my care of the patient were reviewed by me and considered in my medical decision making (see chart for details).    MDM Rules/Calculators/A&P                          40 year old female comes in a chief complaint of severe right-sided arm pain.  She is essentially having shoulder pain, scapular pain and pain radiating to the elbow after she had revision of her brachiocephalic fistula.  The surgical wound is around the right clavicle.  There is large swelling, which I think is likely hematoma.  She is neurovascularly intact at this time, I ordered IV Dilaudid and i topical ice. Spoke with Dr. Gwenlyn Saran, vascular surgery who will assess the patient.  She is significantly hypertensive, but the hypertension is likely related to her severe pain.  No chest pain, shortness of breath, headaches.   6:33 PM I pushed the iv dilaudid 1 mg through her  port. Dr. Donzetta Matters to take pt to the OR for hematoma evacuation. Final Clinical Impression(s) / ED Diagnoses Final diagnoses:  Hematoma    Rx / DC Orders ED Discharge Orders     None  Varney Biles, MD 02/25/21 [place], [place], MD 02/25/21 [phone]

## 2021-02-25 NOTE — Anesthesia Postprocedure Evaluation (Signed)
Anesthesia Post Note  Patient: Interior and spatial designer  Procedure(s) Performed: RIGHT ARM ARTERIOVENOUS FISTULA REVISION WITH TRANSPOSITION OF CEPHALIC VEIN ON AXILLARY VEIN (Right)     Patient location during evaluation: PACU Anesthesia Type: General Pain management: pain level controlled Vital Signs Assessment: post-procedure vital signs reviewed and stable Respiratory status: spontaneous breathing and respiratory function stable Cardiovascular status: stable Postop Assessment: no apparent nausea or vomiting Anesthetic complications: no   No notable events documented.  Last Vitals:  Vitals:   02/25/21 1000 02/25/21 1030  BP: (!) 145/69 (!) 150/75  Pulse: 83 79  Resp: 20 20  Temp:    SpO2: 97% 98%    Last Pain:  Vitals:   02/25/21 1030  TempSrc:   PainSc: 7                  Candra R Milon Dethloff

## 2021-02-25 NOTE — Anesthesia Procedure Notes (Signed)
Procedure Name: Intubation Date/Time: 02/25/2021 7:25 PM Performed by: Oletta Lamas, CRNA Pre-anesthesia Checklist: Patient identified, Emergency Drugs available, Suction available and Patient being monitored Patient Re-evaluated:Patient Re-evaluated prior to induction Oxygen Delivery Method: Circle System Utilized Preoxygenation: Pre-oxygenation with 100% oxygen Induction Type: IV induction Ventilation: Mask ventilation without difficulty Laryngoscope Size: Mac and 3 Grade View: Grade I Tube type: Oral Tube size: 7.0 mm Number of attempts: 1 Airway Equipment and Method: Stylet Placement Confirmation: ETT inserted through vocal cords under direct vision, positive ETCO2 and breath sounds checked- equal and bilateral Secured at: 22 cm Tube secured with: Tape Dental Injury: Teeth and Oropharynx as per pre-operative assessment

## 2021-02-25 NOTE — Anesthesia Procedure Notes (Signed)
Procedure Name: LMA Insertion Date/Time: 02/25/2021 7:49 AM Performed by: Claris Che, CRNA Pre-anesthesia Checklist: Patient identified, Emergency Drugs available, Suction available, Patient being monitored and Timeout performed Patient Re-evaluated:Patient Re-evaluated prior to induction Oxygen Delivery Method: Circle system utilized Preoxygenation: Pre-oxygenation with 100% oxygen Induction Type: IV induction Ventilation: Mask ventilation without difficulty LMA: LMA inserted LMA Size: 4.0 Number of attempts: 1 Placement Confirmation: positive ETCO2 and breath sounds checked- equal and bilateral Tube secured with: Tape Dental Injury: Teeth and Oropharynx as per pre-operative assessment

## 2021-02-25 NOTE — H&P (Signed)
History and Physical Interval Note:  02/25/2021 7:39 AM  [name] [name]  has presented today for surgery, with the diagnosis of MALFUNCTIONING ARTERIOVENOUS FISTULA.  The various methods of treatment have been discussed with the patient and family. After consideration of risks, benefits and other options for treatment, the patient has consented to  Procedure(s): RIGHT ARM ARTERIOVENOUS FISTULA REVISON VERSUS NEW ACCESS RIGHT ARM (Right) as a surgical intervention.  The patient's history has been reviewed, patient examined, no change in status, stable for surgery.  I have reviewed the patient's chart and labs.  Questions were answered to the patient's satisfaction.    Right arm AVF with cephalic arch occluded.  Fistula patent in arm.  Plan turn down with revision onto axillary or brachial vein.  [name]  POST OPERATIVE DIALYSIS ACCESS OFFICE NOTE       CC:  F/u for dialysis access surgery   HPI:  This is a 40 y.o. 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 Carlis Abbott on 12/08/2020.  This was performed due to a history of several months difficulty accessing her fistula likely due to tortuosity.  She also underwent placement of right internal jugular TDC.  She was evaluated one month ago due to significant post-op pain. Her pain has improved, but she has one area that is exquisitely tender to touch> no fever or chills. She also states her Hd treatment team is "not comfortable" yet with accessing her fistual   She is status post right brachiocephalic fistula placed in 2013 by Dr. Donnetta Hutching.     Dialysis days:  MWF Dialysis center:  [facility]   No Known Allergies              Current Outpatient Medications  Medication Sig Dispense Refill   acetaminophen (TYLENOL) 500 MG tablet Take 1,000 mg by mouth every 6 (six) hours as needed for mild pain.       amLODipine (NORVASC) 10 MG tablet Take 10 mg by mouth daily.        aspirin EC 81 MG tablet Take 81 mg by mouth every morning.       calcium acetate (PHOSLO) 667 MG capsule Take 2,001 mg by mouth 3 (three) times daily with meals. Take 1334 mg with a snack       cycloSPORINE modified (NEORAL) 100 MG capsule Take 200 mg by mouth 2 (two) times daily.       cycloSPORINE modified (NEORAL) 25 MG capsule Take 25 mg by mouth daily. Take with 100 mg during the day       furosemide (LASIX) 20 MG tablet Take 40 mg by mouth See admin instructions. Take on tues, thurs, Saturday and sunday       heparin 1000 unit/mL SOLN injection Heparin Sodium (Porcine) 1,000 Units/mL Catheter Lock Arterial       labetalol (NORMODYNE) 200 MG tablet Take 400 mg by mouth 2 (two) times daily.       lidocaine-prilocaine (EMLA) cream Apply 1 application topically daily as needed (pain).       losartan (COZAAR) 100 MG tablet Take 1 tablet by mouth at bedtime as directed       Methoxy PEG-Epoetin Beta (MIRCERA IJ) Mircera       mycophenolate (MYFORTIC) 180 MG EC tablet Take 540 mg by mouth 2 (two) times daily.       predniSONE (DELTASONE) 5 MG tablet Take 15 mg by mouth daily.  5   promethazine (PHENERGAN) 25 MG suppository Place 1 suppository (25 mg total) rectally every 6 (six) hours as needed for nausea or vomiting. 12 each 0   zolpidem (AMBIEN) 10 MG tablet Take 10 mg by mouth at bedtime.        No current facility-administered medications for this visit.       ROS:  See HPI   BP (!) 187/91 (BP Location: Left Arm, Patient Position: Sitting, Cuff Size: Normal)   Pulse 84   Temp 98.6 F (37 C) (Temporal)   Resp 20   Ht 5\' 2"  (1.575 m)   Wt 126 lb 11.2 oz (57.5 kg)   SpO2 96%   BMI 23.17 kg/m     Physical Exam:   General appearance: awake, alert in NAD Chest: catheter site dressing dry and intact. Respirations: unlabored; no dyspnea at rest Left upper extremity: Hand is warm with 5/5 grip strength. Motor function and sensation intact. Fistula is pulsatile to palpation and  auscultation. 2+ radial pulse. Incision(s): Healed with keloid formation.   Assessment/Plan: s/p  revision of right arm AV fistula with aneurysm resection and new primary end-to-end anastomosis approximately 2 months ago.  Her fistula is pulsatile and she has point tenderness along the fistula at the midpoint of her right upper arm.  No signs or symptoms of steal syndrome.  Will make arrangements for fistulogram on a nondialysis day.   Barbie Banner, PA-C 02/08/2021 12:14 PM [facility] [phone]   Clinic MD: Dr. Carlis Abbott

## 2021-02-25 NOTE — Op Note (Signed)
Date: February 25, 2021  Preoperative diagnosis: Malfunctioning right arm brachiocephalic AV fistula with occluded cephalic vein at the cephalic arch  Postoperative diagnosis: Same  Procedure: Revision of right brachiocephalic AV fistula with transposition of the cephalic arch onto the axillary vein (cephalic vein turndown)  Surgeon: Dr. 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  Fistulogram showed an occluded cephalic vein in the arch and we were unable to cross this with a wire during endovascular intervention.  She presents today for cephalic vein turndown on the axillary vein after risks and benefits discussed.  An assistant needed for exposure and to expedite the case.  Findings: Incision was made under the clavicle and the cephalic vein was then mobilized and subsequently transposed onto the axillary vein between the deltopectoral groove and under the pectoralis major lateral to the pectoralis minor.  Good thrill at completion which was much improved from pulsatile prior to surgery.  Anesthesia: General  Details: Patient was taken to the operating room after informed consent was obtained.  Placed on the operative table in supine position.  Anesthesia was induced.  Subsequently her right chest wall and right arm were then prepped and draped in usual sterile fashion.  I then used a sterile [place] and marked out the course of the cephalic vein in her upper arm and shoulder.  After further evaluating this, I elected to transpose her cephalic vein onto the axillary vein.  I made a longitudinal incision under the clavicle between the axillary vein and the cephalic vein.  Dissected through the subcutaneous tissue with Bovie cautery and used retractors for added visualization.  I then mobilized portion of cephalic vein in the superior portion of the wound all the way  to near the juncture with the subclavian vein.  I then went inferior to this and did a muscle-sparing technique between the pectoralis major where we had marked out the axillary vein.  Dissected down and got circumferential control of the pectoralis minor and a vessel loop was placed and retracted the muscle medial.  I then dissected out the axillary vein lateral to the pectoralis minor.  Patient was given 5000 units of IV heparin.  I then clamped the fistula for proximal control in the incision and the cephalic vein was ligated near the cephalic arch over right angle clamp and this was oversewn with a 2-0 silk tie.  I then used baby profunda clamps to control the axillary vein this was then opened with 11 blade scalpel extended Potts scissors.  I then tunneled this between deltopectoral groove and under pectoralis major.  I subsequently sewed an end to side anastomosis of the cephalic vein onto the axillary vein 5-0 Prolene parachute technique.  We did de-air everything prior to completion.  Once we initially came off our clamp on the cephalic vein we had no inflow and I suspected there was some acute thrombus in the fistula and I then passed a #4 Fogarty and was able to retrieve a acute thrombotic plug with excellent pulsatile inflow.  I then flushed the axillary vein again before tying the anastomosis.  We had an excellent thrill in the fistula.  The wound was then irrigated out.  I did have to place one 6-0 Prolene repair stitch in the cephalic vein where a branch tore.  We then irrigated out the wound and closed this in multiple layers with 3-0 Vicryl  4-0 Monocryl and Dermabond.  Complication: None  Condition: Stable  [name], MD [facility] Office: [place]

## 2021-02-25 NOTE — Discharge Instructions (Signed)
   [facility]  Discharge Instructions  AV Fistula or Graft Surgery for Dialysis Access  Please refer to the following instructions for your post-procedure care. Your surgeon or physician assistant will discuss any changes with you.  Activity  You may drive the day following your surgery, if you are comfortable and no longer taking prescription pain medication. Resume full activity as the soreness in your incision resolves.  Bathing/Showering  You may shower after you go home. Keep your incision dry for 48 hours. Do not soak in a bathtub, hot tub, or swim until the incision heals completely. You may not shower if you have a hemodialysis catheter.  Incision Care  Clean your incision with mild soap and water after 48 hours. Pat the area dry with a clean towel. You do not need a bandage unless otherwise instructed. Do not apply any ointments or creams to your incision. You may have skin glue on your incision. Do not peel it off. It will come off on its own in about one week. Your arm may swell a bit after surgery. To reduce swelling use pillows to elevate your arm so it is above your heart. Your doctor will tell you if you need to lightly wrap your arm with an ACE bandage.  Diet  Resume your normal diet. There are not special food restrictions following this procedure. In order to heal from your surgery, it is CRITICAL to get adequate nutrition. Your body requires vitamins, minerals, and protein. Vegetables are the best source of vitamins and minerals. Vegetables also provide the perfect balance of protein. Processed food has little nutritional value, so try to avoid this.  Medications  Resume taking all of your medications. If your incision is causing pain, you may take over-the counter pain relievers such as acetaminophen (Tylenol). If you were prescribed a stronger pain medication, please be aware these medications can cause nausea and constipation. Prevent  nausea by taking the medication with a snack or meal. Avoid constipation by drinking plenty of fluids and eating foods with high amount of fiber, such as fruits, vegetables, and grains.  Do not take Tylenol if you are taking prescription pain medications.  Follow up Your surgeon may want to see you in the office following your access surgery. If so, this will be arranged at the time of your surgery.  Please call us immediately for any of the following conditions:  Increased pain, redness, drainage (pus) from your incision site Fever of 101 degrees or higher Severe or worsening pain at your incision site Hand pain or numbness.  Reduce your risk of vascular disease:  Stop smoking. If you would like help, call [facility] at 1-800-QUIT-NOW 613-305-1978) or [place] at [place] your cholesterol Maintain a desired weight Control your diabetes Keep your blood pressure down  Dialysis  It will take several weeks to several months for your new dialysis access to be ready for use. Your surgeon will determine when it is okay to use it. Your nephrologist will continue to direct your dialysis. You can continue to use your Permcath until your new access is ready for use.   02/25/2021 [name] [name] [id number] 1980-10-15  Surgeon(s): [name], MD  Procedure(s): RIGHT ARM ARTERIOVENOUS FISTULA REVISION WITH TRANSPOSITION OF CEPHALIC VEIN ON AXILLARY VEIN  x May stick fistula immediately   If you have any questions, please call the office at [phone].

## 2021-02-25 NOTE — Op Note (Signed)
    Patient name: [name] [name] MRN: [id number] DOB: Feb 23, 1981 Sex: female  02/25/2021 Pre-operative Diagnosis: End-stage renal disease, symptomatic right axillary vein exposure site hematoma Post-operative diagnosis:  Same Surgeon:  Eda Paschal. Donzetta Matters, MD Assistant: Laurence Slate, PA Procedure Performed: Washout right axillary vein exposure site hematoma and primary repair of anastomosis  Indications:  40 year old female currently dialyzing via right IJ tunneled dialysis catheter.  Earlier today she underwent cephalic vein turndown to her axillary vein.  She returned to the emergency department with symptomatic hematoma with excruciating pain radiating down her right arm into her back.  She was indicated for hematoma washout.  Assistant was necessary to expedite the case.  Findings: There was diffuse oozing throughout the wound.  1 area of the medial anastomosis was repaired with figure-of-eight 6-0 Prolene suture.  At completion the area was dry was closed with interrupted Vicryl deep and staples at the skin.  There was a palpable thrill in the fistula at completion   Procedure:  The patient was identified in the holding area and taken to the operating where she is placed supine operative when general anesthesia induced.  She was to the prepped and draped in the right upper extremity and right shoulder antibiotics were ministered, was called.  The previous incision was opened.  We identified the fistula.  There were 2 dissection cavities 1 from the cephalic vein harvest site this did not appear to be bleeding but there was hematoma which was evacuated.  We then identified the muscle sparing exposure to the deep anastomosis.  This was retracted.  There was significant hematoma there and it was expressed from the deep cavity and also from her breast and distally on her arm.  In total approximately 100 cc of hematoma was expressed.  We inspected the anastomosis.  There was diffuse oozing that was  cauterized.  The anastomosis did appear to have pulsatile bleeding of the medial aspect and this was repaired with interrupted Prolene suture.  I placed Surgicel powder and it appeared to be hemostatic.  We thoroughly irrigated the wound.  We closed the deep platysmal layer with interrupted Vicryl suture and skin was closed with staples.  She was then awakened from anesthesia having tolerated seizure without any complication.  EBL: 10 cc     Monique Gift C. Donzetta Matters, MD [facility] [place] Office: [phone] Pager: [phone]

## 2021-02-25 NOTE — Transfer of Care (Signed)
Immediate Anesthesia Transfer of Care Note  Patient: [name] [name]  Procedure(s) Performed: EVACUATION HEMATOMA RIGHT CHEST (Right: Chest)  Patient Location: PACU  Anesthesia Type:General  Level of Consciousness: drowsy and patient cooperative  Airway & Oxygen Therapy: Patient Spontanous Breathing and Patient connected to nasal cannula oxygen  Post-op Assessment: Report given to RN and Post -op Vital signs reviewed and stable  Post vital signs: Reviewed and stable  Last Vitals:  Vitals Value Taken Time  BP    Temp    Pulse 85 02/25/21 2023  Resp 18 02/25/21 2023  SpO2 99 % 02/25/21 2023  Vitals shown include unvalidated device data.  Last Pain:  Vitals:   02/25/21 1646  TempSrc:   PainSc: 10-Worst pain ever      Patients Stated Pain Goal: 0 (79/48/01 6553)  Complications: No notable events documented.

## 2021-02-25 NOTE — Progress Notes (Signed)
Received pt from PACU to 4E18. VSS. CHG bath not done 2/2 pt's pain. PRNs given. R arm warm, +1 radial pulse, fistula site CDI, R subclavian site guaze CDI. No neuro deficits.  Pt oriented to room and call light. She updated family on her personal cell phone. Will continue to monitor.

## 2021-02-25 NOTE — ED Triage Notes (Signed)
Pt has pain to sx repaired fistula site on R arm that was repaired this morning.

## 2021-02-26 ENCOUNTER — Encounter (HOSPITAL_COMMUNITY): Payer: Self-pay | Admitting: Vascular Surgery

## 2021-02-26 DIAGNOSIS — T82510A Breakdown (mechanical) of surgically created arteriovenous fistula, initial encounter: Secondary | ICD-10-CM | POA: Diagnosis not present

## 2021-02-26 LAB — RENAL FUNCTION PANEL
Albumin: 3.2 g/dL — ABNORMAL LOW (ref 3.5–5.0)
Anion gap: 7 (ref 5–15)
BUN: 33 mg/dL — ABNORMAL HIGH (ref 6–20)
CO2: 28 mmol/L (ref 22–32)
Calcium: 9.6 mg/dL (ref 8.9–10.3)
Chloride: 100 mmol/L (ref 98–111)
Creatinine, Ser: 5.44 mg/dL — ABNORMAL HIGH (ref 0.44–1.00)
GFR, Estimated: 10 mL/min — ABNORMAL LOW (ref >60)
Glucose, Bld: 103 mg/dL — ABNORMAL HIGH (ref 70–99)
Phosphorus: 5.7 mg/dL — ABNORMAL HIGH (ref 2.5–4.6)
Potassium: 6.8 mmol/L (ref 3.5–5.1)
Sodium: 135 mmol/L (ref 135–145)

## 2021-02-26 LAB — HEPATITIS B SURFACE ANTIGEN: Hepatitis B Surface Ag: NONREACTIVE

## 2021-02-26 LAB — HEPATITIS B SURFACE ANTIBODY,QUALITATIVE: Hep B S Ab: REACTIVE — AB

## 2021-02-26 LAB — POTASSIUM: Potassium: 5.4 mmol/L — ABNORMAL HIGH (ref 3.5–5.1)

## 2021-02-26 LAB — HEPATITIS B CORE ANTIBODY, TOTAL: Hep B Core Total Ab: NONREACTIVE

## 2021-02-26 MED ORDER — HYDROMORPHONE HCL 1 MG/ML IJ SOLN
INTRAMUSCULAR | Status: AC
  Administered 2021-02-26: 0.5 mg via INTRAVENOUS
  Filled 2021-02-26: qty 1

## 2021-02-26 MED ORDER — PENTAFLUOROPROP-TETRAFLUOROETH EX AERO: 1.0000 "application " | INHALATION_SPRAY | CUTANEOUS | Status: DC | PRN

## 2021-02-26 MED ORDER — CHLORHEXIDINE GLUCONATE CLOTH 2 % EX PADS: 6.0000 | MEDICATED_PAD | Freq: Every day | CUTANEOUS | Status: DC

## 2021-02-26 MED ORDER — LIDOCAINE HCL (PF) 1 % IJ SOLN: 5.0000 mL | INTRAMUSCULAR | Status: DC | PRN

## 2021-02-26 MED ORDER — SODIUM CHLORIDE 0.9 % IV SOLN
12.5000 mg | Freq: Four times a day (QID) | INTRAVENOUS | Status: DC | PRN
  Administered 2021-02-26: 12.5 mg via INTRAVENOUS
  Filled 2021-02-26 (×2): qty 0.5

## 2021-02-26 MED ORDER — SODIUM CHLORIDE 0.9 % IV SOLN: 100.0000 mL | INTRAVENOUS | Status: DC | PRN

## 2021-02-26 MED ORDER — HEPARIN SODIUM (PORCINE) 1000 UNIT/ML DIALYSIS: 1000.0000 [IU] | INTRAMUSCULAR | Status: DC | PRN

## 2021-02-26 MED ORDER — HYDROMORPHONE HCL 1 MG/ML IJ SOLN
1.0000 mg | Freq: Once | INTRAMUSCULAR | Status: AC
  Administered 2021-02-26: 1 mg via INTRAMUSCULAR
  Filled 2021-02-26: qty 1

## 2021-02-26 MED ORDER — LIDOCAINE-PRILOCAINE 2.5-2.5 % EX CREA: 1.0000 "application " | TOPICAL_CREAM | CUTANEOUS | Status: DC | PRN

## 2021-02-26 MED ORDER — ALTEPLASE 2 MG IJ SOLR: 2.0000 mg | Freq: Once | INTRAMUSCULAR | Status: DC | PRN

## 2021-02-26 NOTE — Progress Notes (Signed)
Pt has had mild nausea since transfer to floor, but able to keep fluids down. However she attempted to eat a [place] sandwich and soon vomited approx 150 mLs undigested food. Zofran was already given about an hour prior. Donzetta Matters MD was notified and a verbal order for 12.5mg  IVPB phenergan was obtained.  Will monitor pt's response.

## 2021-02-26 NOTE — Progress Notes (Signed)
I am aware that patient got admitted overnight-  did get OP HD on M, W and Th in prep for OR yesterday but see that K is high today.  Will do HD for 2 hours to get K down this AM but OK to give Lokelma as well  Louis Meckel

## 2021-02-26 NOTE — Discharge Instructions (Signed)
Keep dressing on until Sunday 02/27/21.  You may wash your right arm and apply dry dressing as needed to right shoulder incision.

## 2021-02-26 NOTE — Anesthesia Postprocedure Evaluation (Signed)
Anesthesia Post Note  Patient: [name] [name]  Procedure(s) Performed: EVACUATION HEMATOMA RIGHT CHEST (Right: Chest)     Patient location during evaluation: PACU Anesthesia Type: General Level of consciousness: awake and alert Pain management: pain level controlled Vital Signs Assessment: post-procedure vital signs reviewed and stable Respiratory status: spontaneous breathing, nonlabored ventilation, respiratory function stable and patient connected to nasal cannula oxygen Cardiovascular status: blood pressure returned to baseline and stable Postop Assessment: no apparent nausea or vomiting Anesthetic complications: no   No notable events documented.  Last Vitals:  Vitals:   02/25/21 2310 02/26/21 0340  BP: (!) 164/77 (!) 141/63  Pulse: 88   Resp: 11 20  Temp: 36.7 C 36.5 C  SpO2: 93% 94%    Last Pain:  Vitals:   02/26/21 0530  TempSrc:   PainSc: 7                  Catalina Gravel

## 2021-02-26 NOTE — Progress Notes (Signed)
Pt complaining of 10/10 pain requesting IV dilaudid, her only access is her HD cath which is heparin locked and does not have a pigtail.  Escalated to charge nurse because patient is insistent that she receives IV pain medicine.  She is refusing her oral norco that she has prescribed due to it making her nauseous.   Discussed with PA who does not want to give her IV pain medicine due her to being discharged.  Patient is now refusing to leave unless she receives her IV pain medicine.  Have paged PA waiting for response.

## 2021-02-26 NOTE — Progress Notes (Addendum)
[facility] [place]  Subjective  - Doing better, still soreness in shoulder area.  Sensation intact and much improved.   Objective (!) 141/63 88 97.7 F (36.5 C) (Oral) 20 94%  Intake/Output Summary (Last 24 hours) at 02/26/2021 0754 Last data filed at 02/26/2021 0617 Gross per 24 hour  Intake 1030 ml  Output 150 ml  Net 880 ml    Moving right UE, sensation intact Dressing pulled back incision clean and and dry Palpable thrill in fistula Palpable radial pulse Lungs non labored breathing  Assessment/Planning: POD # 1 Washout right axillary vein exposure site hematoma and primary repair of anastomosis by Dr. Donzetta Matters POD# 2 Revision of right brachiocephalic AV fistula with transposition of the cephalic arch onto the axillary vein (cephalic vein turndown) by Dr. Carlis Abbott  Her sensation and pain are better.  Right subclavian incision without recurrent hematoma, sensation and motor intact.   Stable for discharge.  She has pain medication at home. F/U in 2-3 weeks for incision check and staple removal.  Roxy Horseman 02/26/2021 7:54 AM --  Laboratory Lab Results: Recent Labs    02/25/21 0605 02/25/21 0629  WBC 4.9  --   HGB 8.7* 8.8*  HCT 26.9* 26.0*  PLT 145*  --    BMET Recent Labs    02/25/21 0629 02/26/21 0337  NA 137 135  K 5.4* 6.8*  CL 99 100  CO2  --  28  GLUCOSE 139* 103*  BUN 23* 33*  CREATININE 3.90* 5.44*  CALCIUM  --  9.6    COAG Lab Results  Component Value Date   INR 1.2 12/20/2018   INR 1.25 11/16/2011   INR 1.23 11/10/2011   No results found for: PTT   I have independently interviewed and examined patient and agree with PA assessment and plan above.   Sravya Grissom C. Donzetta Matters, MD [facility] [place] Office: [phone] Pager: [phone]

## 2021-02-26 NOTE — Progress Notes (Signed)
Received critical potassium of 6.8. Donzetta Matters MD notified and given verbal order to give scheduled 1000 lokelma early. Will continue to monitor.

## 2021-02-27 ENCOUNTER — Encounter (HOSPITAL_COMMUNITY): Payer: Self-pay | Admitting: Vascular Surgery

## 2021-02-27 NOTE — Discharge Summary (Signed)
[facility] Specialists Discharge Summary   Patient ID:  [name] [name] MRN: [id number] DOB/AGE: 12-10-1980 40 y.o.  Admit date: 02/25/2021 Discharge date: 02/26/21 Date of Surgery: 02/25/2021 Surgeon: Surgeon(s): Waynetta Sandy, MD  Admission Diagnosis: Hematoma [T14.8XXA] ESRD (end stage renal disease) ([place]) [N18.6]  Discharge Diagnoses:  Hematoma [T14.8XXA] ESRD (end stage renal disease) ([place]) [N18.6]  Secondary Diagnoses: Past Medical History:  Diagnosis Date   Anemia    Anxiety    ESRD (end stage renal disease) ([place])    Henmodialysis   Gastroparesis    Hypertension    Narcotic abuse ([place])    Non compliance with medical treatment    Renal disorder 2015   transplant   Vision loss     Procedure(s): EVACUATION HEMATOMA RIGHT CHEST  Discharged Condition: stable  HPI: 40 y.o. female with esrd underwent turndown of right cephalic vein avf. Upon arriving home she developed severe pain radiating to her back and posteriorly down her right arm.  Dr. Donzetta Matters proceeded to the OR for irrigation/exploration.   Hospital Course:  [name] [name] is a 40 y.o. female is S/P Right Procedure(s): EVACUATION HEMATOMA RIGHT CHEST and primary repair of anastomosis  She was admitted over night for pain control.  Post she had improved function and pain control.  The incision was clean and dry.  She was discharged home in stable condition with dressing supplies. Consults:  Treatment Team:  Waynetta Sandy, MD Corliss Parish, MD  Significant Diagnostic Studies: CBC Lab Results  Component Value Date   WBC 4.9 02/25/2021   HGB 8.8 (L) 02/25/2021   HCT 26.0 (L) 02/25/2021   MCV 98.2 02/25/2021   PLT 145 (L) 02/25/2021    BMET    Component Value Date/Time   NA 135 02/26/2021 0337   K 5.4 (H) 02/26/2021 1023   CL 100 02/26/2021 0337   CO2 28 02/26/2021 0337   GLUCOSE 103 (H) 02/26/2021 0337   BUN 33 (H) 02/26/2021 0337   CREATININE 5.44  (H) 02/26/2021 0337   CALCIUM 9.6 02/26/2021 0337   GFRNONAA 10 (L) 02/26/2021 0337   GFRAA 3 (L) 05/14/2020 0223   COAG Lab Results  Component Value Date   INR 1.2 12/20/2018   INR 1.25 11/16/2011   INR 1.23 11/10/2011     Disposition:  Discharge to :Home Discharge Instructions     Call MD for:  redness, tenderness, or signs of infection (pain, swelling, bleeding, redness, odor or green/yellow discharge around incision site)   Complete by: As directed    Call MD for:  severe or increased pain, loss or decreased feeling  in affected limb(s)   Complete by: As directed    Call MD for:  temperature >100.5   Complete by: As directed    Resume previous diet   Complete by: As directed       Allergies as of 02/26/2021   No Known Allergies      Medication List     TAKE these medications    acetaminophen 500 MG tablet Commonly known as: TYLENOL Take 1,000 mg by mouth every 6 (six) hours as needed for mild pain.   amLODipine 10 MG tablet Commonly known as: NORVASC Take 10 mg by mouth at bedtime.   aspirin EC 81 MG tablet Take 81 mg by mouth every morning.   calcium acetate 667 MG capsule Commonly known as: PHOSLO Take 1,334-2,001 mg by mouth See admin instructions. Take 2001 mg with each meal and 1334 mg with  each snack   cycloSPORINE modified 100 MG capsule Commonly known as: NEORAL Take 200 mg by mouth 2 (two) times daily.   cycloSPORINE modified 25 MG capsule Commonly known as: NEORAL Take 25 mg by mouth daily.   furosemide 20 MG tablet Commonly known as: LASIX Take 40 mg by mouth 4 (four) times a week. Take 40 mg on Tues, Thurs, Sat, and Sun (non-dialysis days)   heparin 1000 unit/mL Soln injection Heparin Sodium (Porcine) 1,000 Units/mL Catheter Lock Arterial   HYDROcodone-acetaminophen 5-325 MG tablet Commonly known as: Norco Take 1 tablet by mouth every 6 (six) hours as needed for moderate pain.   labetalol 200 MG tablet Commonly known as:  NORMODYNE Take 600 mg by mouth 2 (two) times daily.   lidocaine-prilocaine cream Commonly known as: EMLA Apply 1 application topically daily as needed (port access).   losartan 100 MG tablet Commonly known as: COZAAR Take 100 mg by mouth at bedtime.   MIRCERA IJ Mircera   mycophenolate 180 MG EC tablet Commonly known as: MYFORTIC Take 540 mg by mouth 2 (two) times daily.   ondansetron 4 MG tablet Commonly known as: ZOFRAN Take 4 mg by mouth 2 (two) times daily as needed for nausea or vomiting.   predniSONE 5 MG tablet Commonly known as: DELTASONE Take 15 mg by mouth daily.   zolpidem 10 MG tablet Commonly known as: AMBIEN Take 10 mg by mouth at bedtime.   ZzzQuil 25 MG Caps Generic drug: diphenhydrAMINE HCl (Sleep) Take 50 mg by mouth at bedtime as needed (sleep).       Verbal and written Discharge instructions given to the patient. Wound care per Discharge AVS  Follow-up Information     [name], MD Follow up in 2 week(s).   Specialty: Vascular Surgery Why: Office will call you to arrange your appt (sent) Contact information: [place] [address] [phone]                 Signed: Roxy Horseman 02/27/2021, 9:11 AM

## 2021-02-28 ENCOUNTER — Telehealth: Payer: Self-pay | Admitting: Nephrology

## 2021-02-28 NOTE — Telephone Encounter (Signed)
Transition of Care Contact from [place]  Date of Discharge: 02/26/21 Date of Contact: 02/28/21 Method of contact: phone - attempted  Attempted to contact patient to discuss transition of care from inpatient admission.  Patient did not answer the phone.  Will attempt to call again and if unable to reach will follow up at dialysis.  Jen Mow, PA-C [name] Pager: [phone]

## 2021-03-01 ENCOUNTER — Telehealth: Payer: Self-pay | Admitting: Vascular Surgery

## 2021-03-01 DIAGNOSIS — Z9889 Other specified postprocedural states: Secondary | ICD-10-CM | POA: Diagnosis not present

## 2021-03-01 DIAGNOSIS — Z95828 Presence of other vascular implants and grafts: Secondary | ICD-10-CM | POA: Diagnosis not present

## 2021-03-01 DIAGNOSIS — F17290 Nicotine dependence, other tobacco product, uncomplicated: Secondary | ICD-10-CM | POA: Diagnosis not present

## 2021-03-01 DIAGNOSIS — R0789 Other chest pain: Secondary | ICD-10-CM | POA: Diagnosis not present

## 2021-03-01 DIAGNOSIS — R918 Other nonspecific abnormal finding of lung field: Secondary | ICD-10-CM | POA: Diagnosis not present

## 2021-03-01 DIAGNOSIS — R160 Hepatomegaly, not elsewhere classified: Secondary | ICD-10-CM | POA: Diagnosis not present

## 2021-03-01 DIAGNOSIS — J810 Acute pulmonary edema: Secondary | ICD-10-CM | POA: Diagnosis not present

## 2021-03-01 NOTE — Telephone Encounter (Signed)
This patient had evacuation of a hematoma about 4 days ago.  She was calling requesting pain medicine.  I explained that she really should not be having too much pain this far out from surgery.  He had evacuation of a hematoma in the chest.  I think she needs to be seen in the office tomorrow morning if possible.  She dialyzes in [place] from 12 30-4 30.  Thank you.  CD

## 2021-03-02 ENCOUNTER — Other Ambulatory Visit: Payer: Self-pay | Admitting: Physician Assistant

## 2021-03-02 ENCOUNTER — Other Ambulatory Visit: Payer: Self-pay

## 2021-03-02 ENCOUNTER — Encounter (HOSPITAL_COMMUNITY): Payer: Self-pay

## 2021-03-02 ENCOUNTER — Emergency Department (HOSPITAL_COMMUNITY)
Admission: EM | Admit: 2021-03-02 | Discharge: 2021-03-02 | Disposition: A | Discharge status: Home / Self Care | Payer: Medicare Other | Attending: Emergency Medicine | Admitting: Emergency Medicine

## 2021-03-02 DIAGNOSIS — N186 End stage renal disease: Secondary | ICD-10-CM | POA: Insufficient documentation

## 2021-03-02 DIAGNOSIS — Z79899 Other long term (current) drug therapy: Secondary | ICD-10-CM | POA: Diagnosis not present

## 2021-03-02 DIAGNOSIS — M79601 Pain in right arm: Secondary | ICD-10-CM | POA: Insufficient documentation

## 2021-03-02 DIAGNOSIS — Z87891 Personal history of nicotine dependence: Secondary | ICD-10-CM | POA: Diagnosis not present

## 2021-03-02 DIAGNOSIS — R079 Chest pain, unspecified: Secondary | ICD-10-CM | POA: Diagnosis not present

## 2021-03-02 DIAGNOSIS — M79603 Pain in arm, unspecified: Secondary | ICD-10-CM | POA: Diagnosis not present

## 2021-03-02 DIAGNOSIS — Z7982 Long term (current) use of aspirin: Secondary | ICD-10-CM | POA: Diagnosis not present

## 2021-03-02 DIAGNOSIS — G8918 Other acute postprocedural pain: Secondary | ICD-10-CM

## 2021-03-02 DIAGNOSIS — D631 Anemia in chronic kidney disease: Secondary | ICD-10-CM | POA: Insufficient documentation

## 2021-03-02 DIAGNOSIS — M25519 Pain in unspecified shoulder: Secondary | ICD-10-CM | POA: Diagnosis not present

## 2021-03-02 DIAGNOSIS — J849 Interstitial pulmonary disease, unspecified: Secondary | ICD-10-CM | POA: Diagnosis not present

## 2021-03-02 DIAGNOSIS — I120 Hypertensive chronic kidney disease with stage 5 chronic kidney disease or end stage renal disease: Secondary | ICD-10-CM | POA: Diagnosis not present

## 2021-03-02 DIAGNOSIS — R590 Localized enlarged lymph nodes: Secondary | ICD-10-CM | POA: Diagnosis not present

## 2021-03-02 DIAGNOSIS — E1122 Type 2 diabetes mellitus with diabetic chronic kidney disease: Secondary | ICD-10-CM | POA: Insufficient documentation

## 2021-03-02 LAB — CBC
HCT: 24.1 % — ABNORMAL LOW (ref 36.0–46.0)
Hemoglobin: 7.7 g/dL — ABNORMAL LOW (ref 12.0–15.0)
MCH: 31.6 pg (ref 26.0–34.0)
MCHC: 32 g/dL (ref 30.0–36.0)
MCV: 98.8 fL (ref 80.0–100.0)
Platelets: 126 10*3/uL — ABNORMAL LOW (ref 150–400)
RBC: 2.44 MIL/uL — ABNORMAL LOW (ref 3.87–5.11)
RDW: 13.9 % (ref 11.5–15.5)
WBC: 3 10*3/uL — ABNORMAL LOW (ref 4.0–10.5)
nRBC: 0 % (ref 0.0–0.2)

## 2021-03-02 LAB — BASIC METABOLIC PANEL
Anion gap: 13 (ref 5–15)
BUN: 56 mg/dL — ABNORMAL HIGH (ref 6–20)
CO2: 25 mmol/L (ref 22–32)
Calcium: 9.5 mg/dL (ref 8.9–10.3)
Chloride: 95 mmol/L — ABNORMAL LOW (ref 98–111)
Creatinine, Ser: 7.25 mg/dL — ABNORMAL HIGH (ref 0.44–1.00)
GFR, Estimated: 7 mL/min — ABNORMAL LOW (ref >60)
Glucose, Bld: 82 mg/dL (ref 70–99)
Potassium: 5.2 mmol/L — ABNORMAL HIGH (ref 3.5–5.1)
Sodium: 133 mmol/L — ABNORMAL LOW (ref 135–145)

## 2021-03-02 MED ORDER — HYDROCODONE-ACETAMINOPHEN 5-325 MG PO TABS: 1.0000 | ORAL_TABLET | Freq: Four times a day (QID) | ORAL | 0 refills | Status: DC | PRN

## 2021-03-02 MED ORDER — HYDROMORPHONE HCL 2 MG PO TABS
1.0000 mg | ORAL_TABLET | Freq: Once | ORAL | Status: DC
  Filled 2021-03-02: qty 1

## 2021-03-02 MED ORDER — HYDROMORPHONE HCL 1 MG/ML IJ SOLN
1.0000 mg | Freq: Once | INTRAMUSCULAR | Status: AC
  Administered 2021-03-02: 1 mg via INTRAMUSCULAR
  Filled 2021-03-02: qty 1

## 2021-03-02 MED ORDER — HYDROMORPHONE HCL 1 MG/ML IJ SOLN: 1.0000 mg | Freq: Once | INTRAMUSCULAR | Status: DC

## 2021-03-02 NOTE — Discharge Instructions (Addendum)
Please follow-up with your vein and vascular team for ongoing evaluation and management.  I have given you a few extra pain pills.  Please take, as directed.  If you take them differently, you will again run out quickly.  Your laboratory work-up obtained today is along with your chest imaging obtained earlier this morning at [facility] is reassuring.  You were seen by your vascular surgeon, Dr. Donzetta Matters, who also was reassured by your physical exam and imaging.  Return to the ED or seek immediate medical attention should experience any new or worsening symptoms.

## 2021-03-02 NOTE — ED Provider Notes (Signed)
[facility] EMERGENCY DEPARTMENT Provider Note   CSN: [id number] Arrival date & time: 03/02/21  0930     History Chief Complaint  Patient presents with   Arm Pain    [name] [name] is a 40 y.o. female with past medical history significant for ESRD on HD and narcotic use disorder who returns to the ED with complaints of right shoulder region pain.  I reviewed her medical record and she was recently admitted by vein and vascular on 02/25/2021 for evacuation of hematoma in the right chest.  At that time her blood pressure was elevated 230/98.  It was suspected that the hematoma had compression on the plexus which was contributing to her significant pain symptoms.  After discharge, she called the office of Vein [facility] Vascular requesting further at home analgesics.  She then traveled to [facility] ED in [facility] for her symptoms.  CT obtained this morning at 9 AM demonstrated 1.9 x 5.9 cm air and fluid collection in chest wall, likely post intervention from recent evacuation of right chest hematoma.  She was treated with Dilaudid while in the ED.  Unclear as to whether or not she was discharged home with continued analgesics.    She states that the [facility] ER provider told her that she needed to follow-up with her surgeon.  She called the vascular office and Dr. Scot Dock advised her to come straight here to be seen by Dr. Donzetta Matters.    She reports that she has been taking her narcotic medications every 2 hours rather than every 6 hours.  She states that the swelling predominantly returned yesterday which is what prompted her to seek medical attention.  HPI     Past Medical History:  Diagnosis Date   Anemia    Anxiety    ESRD (end stage renal disease) ([place])    Henmodialysis   Gastroparesis    Hypertension    Narcotic abuse ([place])    Non compliance with medical treatment    Renal disorder 2015   transplant   Vision loss     Patient Active Problem List   Diagnosis  Date Noted   ESRD (end stage renal disease) ([place]) 02/25/2021   Coagulation defect, unspecified ([place]) 12/22/2020   Acute cough 10/04/2020   Allergy, unspecified, initial encounter 05/21/2020   Anaphylactic shock, unspecified, initial encounter 05/21/2020   Symptomatic anemia 02/27/2020   Shortness of breath 04/23/2019   Hypocalcemia 12/30/2018   Aortic atherosclerosis ([place]) 12/27/2018   Acute respiratory failure with hypoxia ([place]) 12/22/2018   Hyperkalemia    Bilateral pneumonia 12/21/2018   Pancreas transplant status ([place]) 12/21/2018   Immunosuppression ([place]) 12/21/2018   Chest pain 12/21/2018   CAP (community acquired pneumonia) 12/20/2018   Other staphylococcus as the cause of diseases classified elsewhere 08/16/2018   Mild protein-calorie malnutrition ([place]) 04/18/2018   Encounter for screening for respiratory tuberculosis 04/06/2018   Gout, unspecified 04/06/2018   Iron deficiency anemia, unspecified 04/06/2018   Leiomyoma of uterus, unspecified 04/06/2018   Secondary hyperparathyroidism of renal origin ([place]) 04/06/2018   Acute rejection of kidney transplant 07/11/2017   Elevated serum creatinine 06/27/2017   E. coli UTI 12/18/2014   Transplant rejection 09/03/2014   Cyst of ovary, left 09/01/2014   Immunosuppressive management encounter following kidney transplant 08/26/2014   Acne 04/28/2014   Itch of skin 04/28/2014   Diabetic gastroparesis associated with type 1 diabetes mellitus ([place]) 11/07/2013   Nausea & vomiting 11/07/2013   Lesion of spleen 10/07/2013  Metabolic acidosis 69/62/9528   Patient's noncompliance with other medical treatment and regimen 09/12/2013   Narcotic dependence ([place]) 08/31/2013   Generalized pain 08/30/2013   Hypomagnesemia 08/30/2013   Renal hematoma of right transplanted kidney 08/21/2013   Hypophosphatemia 07/28/2013   End stage renal disease ([place]) 02/06/2012   Pleural effusion, bilateral 11/16/2011   Anemia in chronic kidney disease  (CKD) 11/16/2011   Pericardial effusion 11/15/2011   Fluid overload 11/15/2011   Abdominal pain, epigastric 11/15/2011   CKD (chronic kidney disease) stage 5, GFR less than 15 ml/min ([place]) 11/03/2011   Hypertension 10/27/2011   Renal failure, chronic 10/27/2011   MEDIAL EPICONDYLITIS, LEFT 12/17/2007   ABSCESS, AXILLA, LEFT 12/03/2007   Type 2 diabetes mellitus with other diabetic kidney complication ([place]) 41/32/4401    Past Surgical History:  Procedure Laterality Date   A/V FISTULAGRAM Right 02/17/2021   Procedure: A/V FISTULAGRAM;  Surgeon: [name], MD;  Location: [facility];  Service: Cardiovascular;  Laterality: Right;   AV FISTULA PLACEMENT  11/17/2011   Procedure: ARTERIOVENOUS (AV) FISTULA CREATION;  Surgeon: Rosetta Posner, MD;  Location: [facility];  Service: Vascular;  Laterality: Right;   EYE SURGERY     HEMATOMA EVACUATION Right 02/25/2021   Procedure: EVACUATION HEMATOMA RIGHT CHEST;  Surgeon: Waynetta Sandy, MD;  Location: [facility];  Service: Vascular;  Laterality: Right;   INSERTION OF DIALYSIS CATHETER Right 12/08/2020   Procedure: INSERTION OF TUNNELED DIALYSIS CATHETER;  Surgeon: [name], MD;  Location: [facility];  Service: Vascular;  Laterality: Right;   KIDNEY TRANSPLANT     NEPHRECTOMY TRANSPLANTED ORGAN     pancrease transplant     PERIPHERAL VASCULAR BALLOON ANGIOPLASTY Right 02/17/2021   Procedure: PERIPHERAL VASCULAR BALLOON ANGIOPLASTY;  Surgeon: [name], MD;  Location: [facility];  Service: Cardiovascular;  Laterality: Right;   REFRACTIVE SURGERY     REVISON OF ARTERIOVENOUS FISTULA Right 12/08/2020   Procedure: RIGHT ARM ARTERIOVENOUS FISTULA REVISION AND RESECTION;  Surgeon: [name], MD;  Location: [facility];  Service: Vascular;  Laterality: Right;   REVISON OF ARTERIOVENOUS FISTULA Right 02/25/2021   Procedure: RIGHT ARM ARTERIOVENOUS FISTULA REVISION WITH TRANSPOSITION OF CEPHALIC VEIN ON AXILLARY VEIN;   Surgeon: [name], MD;  Location: [facility];  Service: Vascular;  Laterality: Right;     OB History   No obstetric history on file.     Family History  Problem Relation Age of Onset   Hypertension Father     Social History   Tobacco Use   Smoking status: Former    Pack years: 0.00    Types: Pipe   Smokeless tobacco: Never   Tobacco comments:    Hooka  Vaping Use   Vaping Use: Never used  Substance Use Topics   Alcohol use: No   Drug use: No    Home Medications Prior to Admission medications   Medication Sig Start Date End Date Taking? Authorizing Provider  acetaminophen (TYLENOL) 500 MG tablet Take 1,000 mg by mouth every 6 (six) hours as needed for mild pain.    [provider]  amLODipine (NORVASC) 10 MG tablet Take 10 mg by mouth at bedtime. 09/19/18   [provider]  aspirin EC 81 MG tablet Take 81 mg by mouth every morning.    [provider]  calcium acetate (PHOSLO) 667 MG capsule Take 1,334-2,001 mg by mouth See admin instructions. Take 2001 mg with each meal and 1334 mg  with each snack 05/04/20   [provider]  cycloSPORINE modified (NEORAL) 100 MG capsule Take 200 mg by mouth 2 (two) times daily.    [provider]  cycloSPORINE modified (NEORAL) 25 MG capsule Take 25 mg by mouth daily.    [provider]  diphenhydrAMINE HCl, Sleep, (ZZZQUIL) 25 MG CAPS Take 50 mg by mouth at bedtime as needed (sleep).    [provider]  furosemide (LASIX) 20 MG tablet Take 40 mg by mouth 4 (four) times a week. Take 40 mg on Tues, Thurs, Sat, and Sun (non-dialysis days) 09/12/19   [provider]  heparin 1000 unit/mL SOLN injection Heparin Sodium (Porcine) 1,000 Units/mL Catheter Lock Arterial 12/22/20 12/21/21  [provider]  HYDROcodone-acetaminophen (NORCO) 5-325 MG tablet Take 1 tablet by mouth every 6 (six) hours as needed for up to 12 doses for severe pain. 03/02/21   Corena Herter,  PA-C  labetalol (NORMODYNE) 200 MG tablet Take 600 mg by mouth 2 (two) times daily. 09/12/19   [provider]  lidocaine-prilocaine (EMLA) cream Apply 1 application topically daily as needed (port access). Patient not taking: Reported on 02/26/2021 07/09/20   [provider]  losartan (COZAAR) 100 MG tablet Take 100 mg by mouth at bedtime. 02/02/21   [provider]  Methoxy PEG-Epoetin Beta (MIRCERA IJ) Mircera 01/03/21 01/02/22  [provider]  mycophenolate (MYFORTIC) 180 MG EC tablet Take 540 mg by mouth 2 (two) times daily.    [provider]  ondansetron (ZOFRAN) 4 MG tablet Take 4 mg by mouth 2 (two) times daily as needed for nausea or vomiting.    [provider]  predniSONE (DELTASONE) 5 MG tablet Take 15 mg by mouth daily. 06/24/15   [provider]  zolpidem (AMBIEN) 10 MG tablet Take 10 mg by mouth at bedtime.    [provider]    Allergies    Patient has no known allergies.  Review of Systems   Review of Systems  All other systems reviewed and are negative.  Physical Exam Updated Vital Signs BP (!) 198/88   Pulse 94   Temp 97.8 F (36.6 C) (Oral)   Resp (!) 23   Ht 5\' 2"  (1.575 m)   Wt 60 kg   LMP  (LMP Unknown)   SpO2 100%   BMI 24.19 kg/m   Physical Exam Vitals and nursing note reviewed. Exam conducted with a chaperone present.  Constitutional:      Appearance: Normal appearance.  HENT:     Head: Normocephalic and atraumatic.  Eyes:     General: No scleral icterus.    Conjunctiva/sclera: Conjunctivae normal.  Cardiovascular:     Rate and Rhythm: Normal rate.  Pulmonary:     Effort: Pulmonary effort is normal.  Musculoskeletal:     Comments: Mild swelling in area of anterior right shoulder/right upper chest.  No overlying erythema or induration.  Peripheral pulses intact.  Sensation intact throughout.  Good strength of right upper extremity.  No fluctuance.  Skin:    General: Skin is dry.   Neurological:     Mental Status: She is alert.     GCS: GCS eye subscore is 4. GCS verbal subscore is 5. GCS motor subscore is 6.  Psychiatric:        Mood and Affect: Mood normal.        Behavior: Behavior normal.        Thought Content: Thought content normal.  ED Results / Procedures / Treatments   Labs (all labs ordered are listed, but only abnormal results are displayed) Labs Reviewed  CBC - Abnormal; Notable for the following components:      Result Value   WBC 3.0 (*)    RBC 2.44 (*)    Hemoglobin 7.7 (*)    HCT 24.1 (*)    Platelets 126 (*)    All other components within normal limits  BASIC METABOLIC PANEL - Abnormal; Notable for the following components:   Sodium 133 (*)    Potassium 5.2 (*)    Chloride 95 (*)    BUN 56 (*)    Creatinine, Ser 7.25 (*)    GFR, Estimated 7 (*)    All other components within normal limits    EKG None  Radiology No results found.  Procedures Procedures   Medications Ordered in ED Medications - No data to display  ED Course  I have reviewed the triage vital signs and the nursing notes.  Pertinent labs & imaging results that were available during my care of the patient were reviewed by me and considered in my medical decision making (see chart for details).    MDM Rules/Calculators/A&P                          [name] [name] was evaluated in Emergency Department on 03/02/2021 for the symptoms described in the history of present illness. She was evaluated in the context of the global COVID-19 pandemic, which necessitated consideration that the patient might be at risk for infection with the SARS-CoV-2 virus that causes COVID-19. Institutional protocols and algorithms that pertain to the evaluation of patients at risk for COVID-19 are in a state of rapid change based on information released by regulatory bodies including the CDC and federal and state organizations. These policies and algorithms were followed during the  patient's care in the ED.  I personally reviewed patient's medical chart and all notes from triage and staff during today's encounter. I have also ordered and reviewed all labs and imaging that I felt to be medically necessary in the evaluation of this patient's complaints and with consideration of their physical exam. If needed, translation services were available and utilized.   Patient was seen by Dr. Donzetta Matters, vein and vascular.  He states that there is no need for surgical intervention and she is reasonable for outpatient and pain control here in ED.    Laboratory work-up is unremarkable.  We will treat with 1 mg Dilaudid oral since her IV fell out.  We will discharge her home with continued analgesics for the next 3-4 days if she takes as directed and outpatient follow-up with vein and vascular.  ER return precautions discussed.  Patient voices understanding is agreeable to plan  Final Clinical Impression(s) / ED Diagnoses Final diagnoses:  Post-op pain    Rx / DC Orders ED Discharge Orders          Ordered    HYDROcodone-acetaminophen (NORCO) 5-325 MG tablet  Every 6 hours PRN        03/02/21 1405             Corena Herter, PA-C 03/02/21 1405    Sherwood Gambler, MD 03/05/21 1523

## 2021-03-02 NOTE — ED Triage Notes (Signed)
Pt BIB [facility] from home. Pt c/o ongoing Right arm pain from recent fistula repair. Pt ran out of her pain meds yesterday at 5pm, pain started at 7pm yesterday   BP 148/100 HR 88 RR 18 96% RA

## 2021-03-02 NOTE — ED Notes (Signed)
Attempted IV start x2, unsuccessful attempts.  

## 2021-03-02 NOTE — Progress Notes (Signed)
    Patient presented for persistent right shoulder pain.  Her swelling appears much improved from her previous evaluation last Friday which required operative intervention.  She does have a CT scan from [facility] today which per report has a an air-fluid collection 1.9 x 5.9 cm which is consistent with the dissection cavity.  There is no gross evidence of infection.  The tissue overlying and surrounding the incision is soft I do not feel that she needs any hematoma evacuation.  Her right hand is neuro intact with a palpable right radial pulse.  The fistula has a strong thrill.  No further vascular surgical intervention needed at this time.  I have recommended a sling and pain control and she can keep her regular scheduled follow-up.  Anas Reister C. Donzetta Matters, MD

## 2021-03-02 NOTE — ED Provider Notes (Signed)
Emergency Medicine Provider Triage Evaluation Note  [name] [name] , a 40 y.o. female  was evaluated in triage.  Pt complains of right arm pain.  Patient with history of ESRD on HD recently admitted for evacuation of hematoma of right cephalic vein AV fistula hematoma.  On my examination, she states that she has recurrence of her pain symptoms in the right anterior deltoid as well as posterior aspect of right shoulder.  There is swelling appreciated.  She states that that focal area feels mildly numb, but there is no distal numbness or weakness.  There are no new injuries.  She is in significant discomfort.    Review of Systems  Positive: Swelling, pain Negative: Numbness  Physical Exam  BP (!) 179/87 (BP Location: Left Arm)   Pulse 86   Temp 97.8 F (36.6 C) (Oral)   Resp 19   LMP  (LMP Unknown)   SpO2 100%  Gen:   Awake, no distress   Resp:  Normal effort  MSK:   Moves extremities without difficulty. Swelling in area of shoulder Other:    Medical Decision Making  Medically screening exam initiated at 9:59 AM.  Appropriate orders placed.  Gary Naseer [place] was informed that the remainder of the evaluation will be completed by another provider, this initial triage assessment does not replace that evaluation, and the importance of remaining in the ED until their evaluation is complete.     Corena Herter, PA-C 03/02/21 [place], MD 03/02/21 1011

## 2021-03-03 ENCOUNTER — Telehealth: Payer: Self-pay

## 2021-03-03 ENCOUNTER — Emergency Department (HOSPITAL_COMMUNITY)
Admission: EM | Admit: 2021-03-03 | Discharge: 2021-03-04 | Disposition: A | Discharge status: Home / Self Care | Payer: Medicare Other | Attending: Emergency Medicine | Admitting: Emergency Medicine

## 2021-03-03 DIAGNOSIS — Z79899 Other long term (current) drug therapy: Secondary | ICD-10-CM | POA: Diagnosis not present

## 2021-03-03 DIAGNOSIS — M25511 Pain in right shoulder: Secondary | ICD-10-CM | POA: Insufficient documentation

## 2021-03-03 DIAGNOSIS — Z7982 Long term (current) use of aspirin: Secondary | ICD-10-CM | POA: Insufficient documentation

## 2021-03-03 DIAGNOSIS — Z87891 Personal history of nicotine dependence: Secondary | ICD-10-CM | POA: Diagnosis not present

## 2021-03-03 DIAGNOSIS — I120 Hypertensive chronic kidney disease with stage 5 chronic kidney disease or end stage renal disease: Secondary | ICD-10-CM | POA: Diagnosis not present

## 2021-03-03 DIAGNOSIS — R110 Nausea: Secondary | ICD-10-CM | POA: Diagnosis not present

## 2021-03-03 DIAGNOSIS — E875 Hyperkalemia: Secondary | ICD-10-CM

## 2021-03-03 DIAGNOSIS — N186 End stage renal disease: Secondary | ICD-10-CM | POA: Insufficient documentation

## 2021-03-03 DIAGNOSIS — E1122 Type 2 diabetes mellitus with diabetic chronic kidney disease: Secondary | ICD-10-CM | POA: Insufficient documentation

## 2021-03-03 DIAGNOSIS — I10 Essential (primary) hypertension: Secondary | ICD-10-CM | POA: Diagnosis not present

## 2021-03-03 LAB — CBC WITH DIFFERENTIAL/PLATELET
Abs Immature Granulocytes: 0.04 10*3/uL (ref 0.00–0.07)
Basophils Absolute: 0 10*3/uL (ref 0.0–0.1)
Basophils Relative: 1 %
Eosinophils Absolute: 0.1 10*3/uL (ref 0.0–0.5)
Eosinophils Relative: 4 %
HCT: 22.9 % — ABNORMAL LOW (ref 36.0–46.0)
Hemoglobin: 7.5 g/dL — ABNORMAL LOW (ref 12.0–15.0)
Immature Granulocytes: 1 %
Lymphocytes Relative: 21 %
Lymphs Abs: 0.7 10*3/uL (ref 0.7–4.0)
MCH: 32.3 pg (ref 26.0–34.0)
MCHC: 32.8 g/dL (ref 30.0–36.0)
MCV: 98.7 fL (ref 80.0–100.0)
Monocytes Absolute: 0.2 10*3/uL (ref 0.1–1.0)
Monocytes Relative: 6 %
Neutro Abs: 2 10*3/uL (ref 1.7–7.7)
Neutrophils Relative %: 67 %
Platelets: 127 10*3/uL — ABNORMAL LOW (ref 150–400)
RBC: 2.32 MIL/uL — ABNORMAL LOW (ref 3.87–5.11)
RDW: 14 % (ref 11.5–15.5)
WBC: 3.1 10*3/uL — ABNORMAL LOW (ref 4.0–10.5)
nRBC: 0 % (ref 0.0–0.2)

## 2021-03-03 LAB — BASIC METABOLIC PANEL
Anion gap: 16 — ABNORMAL HIGH (ref 5–15)
BUN: 85 mg/dL — ABNORMAL HIGH (ref 6–20)
CO2: 23 mmol/L (ref 22–32)
Calcium: 8.8 mg/dL — ABNORMAL LOW (ref 8.9–10.3)
Chloride: 90 mmol/L — ABNORMAL LOW (ref 98–111)
Creatinine, Ser: 9.68 mg/dL — ABNORMAL HIGH (ref 0.44–1.00)
GFR, Estimated: 5 mL/min — ABNORMAL LOW (ref >60)
Glucose, Bld: 96 mg/dL (ref 70–99)
Potassium: 6.2 mmol/L — ABNORMAL HIGH (ref 3.5–5.1)
Sodium: 129 mmol/L — ABNORMAL LOW (ref 135–145)

## 2021-03-03 NOTE — ED Triage Notes (Signed)
Pt c/o continued shoulder pain from fistula repair. Pt states surgeon "keeps telling her to come to ED for pain mgmt, surgeon wants to be called when she's here" 8 staples placed in R shoulder to port in R chest Fistula on R bicep  VSS en route 140/80 77 HR 97% RA

## 2021-03-03 NOTE — ED Provider Notes (Signed)
Emergency Medicine Provider Triage Evaluation Note  [name] [name] , a 40 y.o. female  was evaluated in triage.  Pt complains of pain to her right shoulder.  Patient has had pain to this area since she underwent surgery for evacuation of hematoma on 6/24.  Was seen in emergency department yesterday for similar complaint of postop pain.  Patient reports that pain is slightly improved from yesterday however still rates it 10/10 on pain scale.  Patient denies any numbness or weakness to right arm.  No fevers or chills.  No recent falls or sepsis.  Patient states that she spoke to her surgeon Dr. Donzetta Matters who advised her to come to the emergency department for pain management.   Review of Systems  Positive: Right shoulder pain Negative: Numbness, weakness, fever, chills  Physical Exam  BP (!) 186/86 (BP Location: Left Arm)   Pulse 80   Temp 98.1 F (36.7 C) (Oral)   Resp 20   LMP  (LMP Unknown)   SpO2 95%  Gen:   Awake, no distress   Resp:  Normal effort  MSK:   Moves extremities without difficulty; right upper arm with palpable thrill no surrounding erythema or signs of infection.  Tenderness to palpation right trapezius muscle.  No swelling, warmth, or erythema to the right shoulder.  Pulse, motor, and sensation intact distally from right shoulder Other:    Medical Decision Making  Medically screening exam initiated at 8:06 PM.  Appropriate orders placed.  [name] [name] was informed that the remainder of the evaluation will be completed by another provider, this initial triage assessment.  Surgery replace that evaluation, and the importance of remaining in the ED until their evaluation is complete.  The patient appears stable so that the remainder of the work up may be completed by another provider.      Loni Beckwith, PA-C 03/03/21 2012    Deno Etienne, DO 03/03/21 2215

## 2021-03-03 NOTE — Telephone Encounter (Signed)
Pt was seen in the ED yesterday.  She says Dr. Donzetta Matters told her that she would be given something to calm her down and help the pain before she was sent home.  She said as soon as he left the room the ED doctor gave her ashot and sent her home right away.  She expected to be observed for a while, but was not.  Her medication was refilled and the edge is off the pain, but she is still in pain.  She wants help with that, but doesn;t want to go back to the hosptial.

## 2021-03-04 DIAGNOSIS — N2581 Secondary hyperparathyroidism of renal origin: Secondary | ICD-10-CM | POA: Diagnosis not present

## 2021-03-04 DIAGNOSIS — D631 Anemia in chronic kidney disease: Secondary | ICD-10-CM | POA: Diagnosis not present

## 2021-03-04 DIAGNOSIS — R110 Nausea: Secondary | ICD-10-CM | POA: Diagnosis not present

## 2021-03-04 DIAGNOSIS — I10 Essential (primary) hypertension: Secondary | ICD-10-CM | POA: Diagnosis not present

## 2021-03-04 DIAGNOSIS — E875 Hyperkalemia: Secondary | ICD-10-CM | POA: Diagnosis not present

## 2021-03-04 DIAGNOSIS — Z992 Dependence on renal dialysis: Secondary | ICD-10-CM | POA: Diagnosis not present

## 2021-03-04 DIAGNOSIS — M25511 Pain in right shoulder: Secondary | ICD-10-CM | POA: Diagnosis not present

## 2021-03-04 DIAGNOSIS — N186 End stage renal disease: Secondary | ICD-10-CM | POA: Diagnosis not present

## 2021-03-04 DIAGNOSIS — E1129 Type 2 diabetes mellitus with other diabetic kidney complication: Secondary | ICD-10-CM | POA: Diagnosis not present

## 2021-03-04 DIAGNOSIS — D509 Iron deficiency anemia, unspecified: Secondary | ICD-10-CM | POA: Diagnosis not present

## 2021-03-04 DIAGNOSIS — T8610 Unspecified complication of kidney transplant: Secondary | ICD-10-CM | POA: Diagnosis not present

## 2021-03-04 LAB — BASIC METABOLIC PANEL
Anion gap: 15 (ref 5–15)
BUN: 90 mg/dL — ABNORMAL HIGH (ref 6–20)
CO2: 22 mmol/L (ref 22–32)
Calcium: 8.9 mg/dL (ref 8.9–10.3)
Chloride: 96 mmol/L — ABNORMAL LOW (ref 98–111)
Creatinine, Ser: 10.12 mg/dL — ABNORMAL HIGH (ref 0.44–1.00)
GFR, Estimated: 5 mL/min — ABNORMAL LOW (ref >60)
Glucose, Bld: 84 mg/dL (ref 70–99)
Potassium: 6.3 mmol/L (ref 3.5–5.1)
Sodium: 133 mmol/L — ABNORMAL LOW (ref 135–145)

## 2021-03-04 MED ORDER — HYDROMORPHONE HCL 1 MG/ML IJ SOLN
1.0000 mg | Freq: Once | INTRAMUSCULAR | Status: AC
  Administered 2021-03-04: 1 mg via INTRAMUSCULAR
  Filled 2021-03-04: qty 1

## 2021-03-04 MED ORDER — HYDROMORPHONE HCL 1 MG/ML IJ SOLN
0.5000 mg | INTRAMUSCULAR | Status: AC | PRN
  Administered 2021-03-04: 0.5 mg via INTRAVENOUS
  Filled 2021-03-04: qty 1

## 2021-03-04 MED ORDER — HYDROMORPHONE HCL 1 MG/ML IJ SOLN
0.5000 mg | INTRAMUSCULAR | Status: AC | PRN
  Administered 2021-03-04 (×2): 0.5 mg via INTRAVENOUS
  Filled 2021-03-04 (×2): qty 1

## 2021-03-04 MED ORDER — SODIUM ZIRCONIUM CYCLOSILICATE 10 G PO PACK
10.0000 g | PACK | Freq: Once | ORAL | Status: AC
  Administered 2021-03-04: 10 g via ORAL
  Filled 2021-03-04: qty 1

## 2021-03-04 MED ORDER — ONDANSETRON 4 MG PO TBDP
4.0000 mg | ORAL_TABLET | Freq: Once | ORAL | Status: AC
  Administered 2021-03-04: 4 mg via ORAL
  Filled 2021-03-04: qty 1

## 2021-03-04 NOTE — ED Notes (Signed)
PA Humes notified of pts potassium of 6.3

## 2021-03-04 NOTE — ED Provider Notes (Signed)
[facility] EMERGENCY DEPARTMENT Provider Note   CSN: [id number] Arrival date & time: 03/03/21  2003     History Chief Complaint  Patient presents with   Shoulder Pain    [name] [name] is a 40 y.o. female.  40 year old female with a history of ESRD, hypertension, gastroparesis, narcotic abuse presents to the emergency department for complaints of persistent right shoulder pain.  She is 8 days status post evacuation of R axillary vein hematoma. Was prescribed hydrocodone after her visit to the ED yesterday. Has been taking this medication for pain with minimal relief. Pain aggravated with palpation. While she subjectively feels the area remains swollen, she does report little outward change over the past 24 hours. States she consulted with Dr. Donzetta Matters of [facility] who advised return to the ED for pain control. She was evaluated by Dr. Donzetta Matters in this department yesterday with reassuring bedside evaluation. No new numbness or weakness in the extremity, fevers, drainage from surgical site.  The history is provided by the patient. No language interpreter was used.  Shoulder Pain     Past Medical History:  Diagnosis Date   Anemia    Anxiety    ESRD (end stage renal disease) ([place])    Henmodialysis   Gastroparesis    Hypertension    Narcotic abuse ([place])    Non compliance with medical treatment    Renal disorder 2015   transplant   Vision loss     Patient Active Problem List   Diagnosis Date Noted   ESRD (end stage renal disease) ([place]) 02/25/2021   Coagulation defect, unspecified ([place]) 12/22/2020   Acute cough 10/04/2020   Allergy, unspecified, initial encounter 05/21/2020   Anaphylactic shock, unspecified, initial encounter 05/21/2020   Symptomatic anemia 02/27/2020   Shortness of breath 04/23/2019   Hypocalcemia 12/30/2018   Aortic atherosclerosis ([place]) 12/27/2018   Acute respiratory failure with hypoxia ([place]) 12/22/2018   Hyperkalemia    Bilateral pneumonia  12/21/2018   Pancreas transplant status ([place]) 12/21/2018   Immunosuppression ([place]) 12/21/2018   Chest pain 12/21/2018   CAP (community acquired pneumonia) 12/20/2018   Other staphylococcus as the cause of diseases classified elsewhere 08/16/2018   Mild protein-calorie malnutrition ([place]) 04/18/2018   Encounter for screening for respiratory tuberculosis 04/06/2018   Gout, unspecified 04/06/2018   Iron deficiency anemia, unspecified 04/06/2018   Leiomyoma of uterus, unspecified 04/06/2018   Secondary hyperparathyroidism of renal origin ([place]) 04/06/2018   Acute rejection of kidney transplant 07/11/2017   Elevated serum creatinine 06/27/2017   E. coli UTI 12/18/2014   Transplant rejection 09/03/2014   Cyst of ovary, left 09/01/2014   Immunosuppressive management encounter following kidney transplant 08/26/2014   Acne 04/28/2014   Itch of skin 04/28/2014   Diabetic gastroparesis associated with type 1 diabetes mellitus ([place]) 11/07/2013   Nausea & vomiting 11/07/2013   Lesion of spleen 51/76/1607   Metabolic acidosis 37/06/6268   Patient's noncompliance with other medical treatment and regimen 09/12/2013   Narcotic dependence ([place]) 08/31/2013   Generalized pain 08/30/2013   Hypomagnesemia 08/30/2013   Renal hematoma of right transplanted kidney 08/21/2013   Hypophosphatemia 07/28/2013   End stage renal disease ([place]) 02/06/2012   Pleural effusion, bilateral 11/16/2011   Anemia in chronic kidney disease (CKD) 11/16/2011   Pericardial effusion 11/15/2011   Fluid overload 11/15/2011   Abdominal pain, epigastric 11/15/2011   CKD (chronic kidney disease) stage 5, GFR less than 15 ml/min (HCC) 11/03/2011   Hypertension 10/27/2011   Renal failure, chronic  10/27/2011   MEDIAL EPICONDYLITIS, LEFT 12/17/2007   ABSCESS, AXILLA, LEFT 12/03/2007   Type 2 diabetes mellitus with other diabetic kidney complication ([place]) 81/85/6314    Past Surgical History:  Procedure Laterality Date   A/V  FISTULAGRAM Right 02/17/2021   Procedure: A/V FISTULAGRAM;  Surgeon: [name], MD;  Location: [place] [facility];  Service: Cardiovascular;  Laterality: Right;   AV FISTULA PLACEMENT  11/17/2011   Procedure: ARTERIOVENOUS (AV) FISTULA CREATION;  Surgeon: Rosetta Posner, MD;  Location: [place];  Service: Vascular;  Laterality: Right;   EYE SURGERY     HEMATOMA EVACUATION Right 02/25/2021   Procedure: EVACUATION HEMATOMA RIGHT CHEST;  Surgeon: Waynetta Sandy, MD;  Location: [place];  Service: Vascular;  Laterality: Right;   INSERTION OF DIALYSIS CATHETER Right 12/08/2020   Procedure: INSERTION OF TUNNELED DIALYSIS CATHETER;  Surgeon: [name], MD;  Location: [place];  Service: Vascular;  Laterality: Right;   KIDNEY TRANSPLANT     NEPHRECTOMY TRANSPLANTED ORGAN     pancrease transplant     PERIPHERAL VASCULAR BALLOON ANGIOPLASTY Right 02/17/2021   Procedure: PERIPHERAL VASCULAR BALLOON ANGIOPLASTY;  Surgeon: [name], MD;  Location: [place] [facility];  Service: Cardiovascular;  Laterality: Right;   REFRACTIVE SURGERY     REVISON OF ARTERIOVENOUS FISTULA Right 12/08/2020   Procedure: RIGHT ARM ARTERIOVENOUS FISTULA REVISION AND RESECTION;  Surgeon: [name], MD;  Location: [place];  Service: Vascular;  Laterality: Right;   REVISON OF ARTERIOVENOUS FISTULA Right 02/25/2021   Procedure: RIGHT ARM ARTERIOVENOUS FISTULA REVISION WITH TRANSPOSITION OF CEPHALIC VEIN ON AXILLARY VEIN;  Surgeon: [name], MD;  Location: [place];  Service: Vascular;  Laterality: Right;     OB History   No obstetric history on file.     Family History  Problem Relation Age of Onset   Hypertension Father     Social History   Tobacco Use   Smoking status: Former    Pack years: 0.00    Types: Pipe   Smokeless tobacco: Never   Tobacco comments:    Hooka  Vaping Use   Vaping Use: Never used  Substance Use Topics   Alcohol use: No   Drug use: No    Home  Medications Prior to Admission medications   Medication Sig Start Date End Date Taking? Authorizing Provider  acetaminophen (TYLENOL) 500 MG tablet Take 1,000 mg by mouth every 6 (six) hours as needed for mild pain.   Yes [provider]  amLODipine (NORVASC) 10 MG tablet Take 10 mg by mouth at bedtime. 09/19/18  Yes [provider]  aspirin EC 81 MG tablet Take 81 mg by mouth every morning.   Yes [provider]  calcium acetate (PHOSLO) 667 MG capsule Take 1,334-2,001 mg by mouth See admin instructions. Take 2001 mg with each meal and 1334 mg with each snack 05/04/20  Yes [provider]  colchicine 0.6 MG tablet Take 0.6 mg by mouth daily as needed (gout flares). 02/28/21  Yes [provider]  cycloSPORINE modified (NEORAL) 100 MG capsule Take 200 mg by mouth 2 (two) times daily.   Yes [provider]  cycloSPORINE modified (NEORAL) 25 MG capsule Take 25 mg by mouth daily.   Yes [provider]  diphenhydrAMINE HCl, Sleep, (ZZZQUIL) 25 MG CAPS Take 50 mg by mouth at bedtime as needed (sleep).   Yes [provider]  furosemide (LASIX) 20 MG tablet Take 40 mg by  mouth 4 (four) times a week. Take 40 mg on Tues, Thurs, Sat, and Sun (non-dialysis days) 09/12/19  Yes [provider]  heparin 1000 unit/mL SOLN injection Heparin Sodium (Porcine) 1,000 Units/mL Catheter Lock Arterial 12/22/20 12/21/21 Yes [provider]  HYDROcodone-acetaminophen (NORCO) 5-325 MG tablet Take 1 tablet by mouth every 6 (six) hours as needed for up to 12 doses for severe pain. 03/02/21  Yes Corena Herter, PA-C  labetalol (NORMODYNE) 200 MG tablet Take 600 mg by mouth 2 (two) times daily. 09/12/19  Yes [provider]  lidocaine-prilocaine (EMLA) cream Apply 1 application topically daily as needed (port access). 07/09/20  Yes [provider]  losartan (COZAAR) 100 MG tablet Take 100 mg by mouth at bedtime. 02/02/21  Yes [provider]  Methoxy PEG-Epoetin Beta (MIRCERA IJ) Mircera 01/03/21 01/02/22 Yes [provider]  mycophenolate (MYFORTIC) 180 MG EC tablet Take 540 mg by mouth 2 (two) times daily.   Yes [provider]  ondansetron (ZOFRAN) 4 MG tablet Take 4 mg by mouth 2 (two) times daily as needed for nausea or vomiting.   Yes [provider]  predniSONE (DELTASONE) 5 MG tablet Take 15 mg by mouth daily. 06/24/15  Yes [provider]  zolpidem (AMBIEN) 10 MG tablet Take 10 mg by mouth at bedtime.   Yes [provider]    Allergies    Patient has no known allergies.  Review of Systems   Review of Systems Ten systems reviewed and are negative for acute change, except as noted in the HPI.    Physical Exam Updated Vital Signs BP (!) 170/82 (BP Location: Left Arm)   Pulse 91   Temp 98 F (36.7 C) (Oral)   Resp 17   LMP  (LMP Unknown)   SpO2 99%   Physical Exam Vitals and nursing note reviewed.  Constitutional:      General: She is not in acute distress.    Appearance: She is well-developed. She is not diaphoretic.     Comments: Patient moaning, appears uncomfortable.  HENT:     Head: Normocephalic and atraumatic.  Eyes:     General: No scleral icterus.    Conjunctiva/sclera: Conjunctivae normal.  Cardiovascular:     Rate and Rhythm: Normal rate and regular rhythm.     Pulses: Normal pulses.     Comments: Good palpable thrill to RUE fistula. Pulmonary:     Effort: Pulmonary effort is normal. No respiratory distress.     Breath sounds: No stridor.     Comments: Respirations even and unlabored Musculoskeletal:        General: Normal range of motion.     Cervical back: Normal range of motion.     Comments: Some TTP to the shoulder with mild edema. No crepitus or deformity. Surgical site of the RUE is C/D/I. No purulence or erythema. RUE is warm and well perfused.   Skin:    General: Skin is warm and dry.     Coloration: Skin is not pale.      Findings: No erythema or rash.  Neurological:     Mental Status: She is alert and oriented to person, place, and time.     Coordination: Coordination normal.     Comments: Sensation intact in BUE. Moving extremities symmetrically.   Psychiatric:        Mood and Affect: Mood is anxious.        Speech: Speech is rapid and pressured.  Behavior: Behavior is hyperactive.    ED Results / Procedures / Treatments   Labs (all labs ordered are listed, but only abnormal results are displayed) Labs Reviewed  BASIC METABOLIC PANEL - Abnormal; Notable for the following components:      Result Value   Sodium 129 (*)    Potassium 6.2 (*)    Chloride 90 (*)    BUN 85 (*)    Creatinine, Ser 9.68 (*)    Calcium 8.8 (*)    GFR, Estimated 5 (*)    Anion gap 16 (*)    All other components within normal limits  CBC WITH DIFFERENTIAL/PLATELET - Abnormal; Notable for the following components:   WBC 3.1 (*)    RBC 2.32 (*)    Hemoglobin 7.5 (*)    HCT 22.9 (*)    Platelets 127 (*)    All other components within normal limits  BASIC METABOLIC PANEL - Abnormal; Notable for the following components:   Sodium 133 (*)    Potassium 6.3 (*)    Chloride 96 (*)    BUN 90 (*)    Creatinine, Ser 10.12 (*)    GFR, Estimated 5 (*)    All other components within normal limits    EKG None  Radiology No results found.  Procedures Procedures   Medications Ordered in ED Medications  HYDROmorphone (DILAUDID) injection 1 mg (1 mg Intramuscular Given 03/04/21 0224)  HYDROmorphone (DILAUDID) injection 0.5 mg (0.5 mg Intravenous Given 03/04/21 0519)  ondansetron (ZOFRAN-ODT) disintegrating tablet 4 mg (4 mg Oral Given 03/04/21 0355)  sodium zirconium cyclosilicate (LOKELMA) packet 10 g (10 g Oral Given 03/04/21 0409)  HYDROmorphone (DILAUDID) injection 0.5 mg (0.5 mg Intravenous Given 03/04/21 0550)    ED Course  I have reviewed the triage vital signs and the nursing notes.  Pertinent labs & imaging  results that were available during my care of the patient were reviewed by me and considered in my medical decision making (see chart for details).  Clinical Course as of 03/04/21 3419  Ludwig Clarks Mar 04, 2021  0227 Spoke with Dr. Donzetta Matters of [facility]. Recommends attempt to temporize pain and to call back with updates if pain poorly controlled. If patient necessitates admission for reasons other than her postoperative pain, can follow while inpatient for this ongoing issue. [KH]  [id number] Patient reports that her pain is improved.  She states that she is feeling better.  I have discussed her laboratory abnormalities and her need for dialysis.  She verbalizes understanding.  Feels that she can continue outpatient pain control with her previously prescribed Norco.  Patient states that she will go to dialysis today as scheduled. [KH]    Clinical Course User Index [KH] Antonietta Breach, PA-C   MDM Rules/Calculators/A&P                          40 year old female presents to the emergency department for persistent pain in her right shoulder associated with recent hematoma evacuation.  Procedure performed by vascular surgery.  She is neurovascularly intact.  No signs of recurrent hematoma.  Was assessed at bedside by Dr. Donzetta Matters while in the ED.  Do not feel additional operative intervention was clinically indicated.  Patient denies any acute change over the past 24 hours, simply difficulty controlling her pain with her home prescribed pain medications.  She has been using Norco without symptomatic improvement.  Pain now controlled after a few doses of IV Dilaudid.  She  feels comfortable with outpatient management given degree of pain relief.  Does have some changes to her laboratory testing today consistent with need for dialysis.  She is scheduled for her dialysis session at noon.  Given Lokelma to temporize potassium.  EKG reassuring.  Patient stable for continued outpatient follow-up.  Return precautions discussed and provided.  Patient discharged in stable condition with no unaddressed concerns.   Final Clinical Impression(s) / ED Diagnoses Final diagnoses:  Right shoulder pain, unspecified chronicity  Hyperkalemia    Rx / DC Orders ED Discharge Orders     None        Antonietta Breach, PA-C 67/73/73 [id number]    Delora Fuel, MD 59/47/07 6151    Delora Fuel, MD 83/43/73 [phone]

## 2021-03-04 NOTE — ED Notes (Signed)
Patient verbalized understanding of discharge instructions. Opportunity for questions and answers.  

## 2021-03-04 NOTE — Discharge Instructions (Addendum)
Go to dialysis today as scheduled.  You may continue use of hydrocodone as previously prescribed.  Do not drive or drink alcohol after taking this medication as it may make you drowsy and impair your judgment.  Continue follow-up with Dr. Donzetta Matters in the office.  You may return for new or concerning symptoms.

## 2021-03-07 ENCOUNTER — Telehealth: Payer: Self-pay | Admitting: Surgery

## 2021-03-07 NOTE — Telephone Encounter (Signed)
Patient called with continued pain from her surgeries that was worse after dialysis.  She describes it as a shooting pain around her incision and her catheter.  It does not sound like ischemic pain.  I told her to take another pain pill.  She had only taken 1 pill.  She is going to call the office to come in to see [place] in the am.  She did not really want to come to the ER.

## 2021-03-08 ENCOUNTER — Telehealth: Payer: Self-pay

## 2021-03-08 ENCOUNTER — Encounter: Payer: Self-pay | Admitting: Vascular Surgery

## 2021-03-08 ENCOUNTER — Other Ambulatory Visit: Payer: Self-pay

## 2021-03-08 ENCOUNTER — Ambulatory Visit (INDEPENDENT_AMBULATORY_CARE_PROVIDER_SITE_OTHER): Payer: Medicare Other | Admitting: Vascular Surgery

## 2021-03-08 VITALS — BP 187/87 | HR 89 | Temp 98.2°F | Resp 16 | Ht 62.0 in | Wt 133.0 lb

## 2021-03-08 DIAGNOSIS — N186 End stage renal disease: Secondary | ICD-10-CM

## 2021-03-08 MED ORDER — OXYCODONE-ACETAMINOPHEN 5-325 MG PO TABS: 1.0000 | ORAL_TABLET | Freq: Four times a day (QID) | ORAL | 0 refills | Status: DC | PRN

## 2021-03-08 NOTE — Telephone Encounter (Signed)
Patient calls this morning to make an appointment. She sounds tearful and reports she is still having post operative pain and has not slept. Says she spoke to on call MD last night and he advised her to make appt today. Informed [facility] and placed on schedule today.

## 2021-03-08 NOTE — Progress Notes (Signed)
Patient name: [name] [name] MRN: [id number] DOB: [birth year] Sex: female  REASON FOR VISIT: Triage visit for postop pain  HPI: [name] [name] is a 40 y.o. female with end-stage renal disease on hemodialysis that presents for triage visit for postop pain.  She had a right arm AV fistula that was a brachiocephalic.  Ultimately she had occlusion at the cephalic arch that we were unable to cross with endovascular intervention.  On 02/25/2021 I performed a cephalic vein turndown onto the axillary vein.  Postop course was complicated by hematoma that required washout.  Today she states her pain is much improved but she still having some soreness up under her armpit and at the incison.  She has a lot of anxiety at baseline.  She is using a tunneled catheter that was placed prior to her revision.  No numbness tingling or weakness in the right arm.  Past Medical History:  Diagnosis Date   Anemia    Anxiety    ESRD (end stage renal disease) ([place])    Henmodialysis   Gastroparesis    Hypertension    Narcotic abuse ([place])    Non compliance with medical treatment    Renal disorder 2015   transplant   Vision loss     Past Surgical History:  Procedure Laterality Date   A/V FISTULAGRAM Right 02/17/2021   Procedure: A/V FISTULAGRAM;  Surgeon: [name], MD;  Location: [place] [facility];  Service: Cardiovascular;  Laterality: Right;   AV FISTULA PLACEMENT  11/17/2011   Procedure: ARTERIOVENOUS (AV) FISTULA CREATION;  Surgeon: Rosetta Posner, MD;  Location: [place];  Service: Vascular;  Laterality: Right;   EYE SURGERY     HEMATOMA EVACUATION Right 02/25/2021   Procedure: EVACUATION HEMATOMA RIGHT CHEST;  Surgeon: Waynetta Sandy, MD;  Location: [place];  Service: Vascular;  Laterality: Right;   INSERTION OF DIALYSIS CATHETER Right 12/08/2020   Procedure: INSERTION OF TUNNELED DIALYSIS CATHETER;  Surgeon: [name], MD;  Location: [place];  Service: Vascular;  Laterality:  Right;   KIDNEY TRANSPLANT     NEPHRECTOMY TRANSPLANTED ORGAN     pancrease transplant     PERIPHERAL VASCULAR BALLOON ANGIOPLASTY Right 02/17/2021   Procedure: PERIPHERAL VASCULAR BALLOON ANGIOPLASTY;  Surgeon: [name], MD;  Location: [place] [facility];  Service: Cardiovascular;  Laterality: Right;   REFRACTIVE SURGERY     REVISON OF ARTERIOVENOUS FISTULA Right 12/08/2020   Procedure: RIGHT ARM ARTERIOVENOUS FISTULA REVISION AND RESECTION;  Surgeon: [name], MD;  Location: [place];  Service: Vascular;  Laterality: Right;   REVISON OF ARTERIOVENOUS FISTULA Right 02/25/2021   Procedure: RIGHT ARM ARTERIOVENOUS FISTULA REVISION WITH TRANSPOSITION OF CEPHALIC VEIN ON AXILLARY VEIN;  Surgeon: [name], MD;  Location: [place];  Service: Vascular;  Laterality: Right;    Family History  Problem Relation Age of Onset   Hypertension Father     SOCIAL HISTORY: Social History   Tobacco Use   Smoking status: Former    Pack years: 0.00    Types: Pipe   Smokeless tobacco: Never   Tobacco comments:    Hooka  Substance Use Topics   Alcohol use: No    No Known Allergies  Current Outpatient Medications  Medication Sig Dispense Refill   acetaminophen (TYLENOL) 500 MG tablet Take 1,000 mg by mouth every 6 (six) hours as needed for mild pain.     amLODipine (NORVASC) 10 MG tablet Take 10 mg by mouth  at bedtime.     aspirin EC 81 MG tablet Take 81 mg by mouth every morning.     calcium acetate (PHOSLO) 667 MG capsule Take 1,334-2,001 mg by mouth See admin instructions. Take 2001 mg with each meal and 1334 mg with each snack     colchicine 0.6 MG tablet Take 0.6 mg by mouth daily as needed (gout flares).     cycloSPORINE modified (NEORAL) 100 MG capsule Take 200 mg by mouth 2 (two) times daily.     cycloSPORINE modified (NEORAL) 25 MG capsule Take 25 mg by mouth daily.     diphenhydrAMINE HCl, Sleep, (ZZZQUIL) 25 MG CAPS Take 50 mg by mouth at bedtime as needed  (sleep).     furosemide (LASIX) 20 MG tablet Take 40 mg by mouth 4 (four) times a week. Take 40 mg on Tues, Thurs, Sat, and Sun (non-dialysis days)     heparin 1000 unit/mL SOLN injection Heparin Sodium (Porcine) 1,000 Units/mL Catheter Lock Arterial     labetalol (NORMODYNE) 200 MG tablet Take 600 mg by mouth 2 (two) times daily.     lidocaine-prilocaine (EMLA) cream Apply 1 application topically daily as needed (port access).     losartan (COZAAR) 100 MG tablet Take 100 mg by mouth at bedtime.     Methoxy PEG-Epoetin Beta (MIRCERA IJ) Mircera     mycophenolate (MYFORTIC) 180 MG EC tablet Take 540 mg by mouth 2 (two) times daily.     ondansetron (ZOFRAN) 4 MG tablet Take 4 mg by mouth 2 (two) times daily as needed for nausea or vomiting.     oxyCODONE-acetaminophen (PERCOCET/ROXICET) 5-325 MG tablet Take 1-2 tablets by mouth every 6 (six) hours as needed for severe pain. 30 tablet 0   predniSONE (DELTASONE) 5 MG tablet Take 15 mg by mouth daily.  5   zolpidem (AMBIEN) 10 MG tablet Take 10 mg by mouth at bedtime.     HYDROcodone-acetaminophen (NORCO) 5-325 MG tablet Take 1 tablet by mouth every 6 (six) hours as needed for up to 12 doses for severe pain. (Patient not taking: Reported on 03/08/2021) 10 tablet 0   No current facility-administered medications for this visit.    REVIEW OF SYSTEMS:  [X]  denotes positive finding, [ ]  denotes negative finding Cardiac  Comments:  Chest pain or chest pressure:    Shortness of breath upon exertion:    Short of breath when lying flat:    Irregular heart rhythm:        Vascular    Pain in calf, thigh, or hip brought on by ambulation:    Pain in feet at night that wakes you up from your sleep:     Blood clot in your veins:    Leg swelling:         Pulmonary    Oxygen at home:    Productive cough:     Wheezing:         Neurologic    Sudden weakness in arms or legs:     Sudden numbness in arms or legs:     Sudden onset of difficulty speaking or  slurred speech:    Temporary loss of vision in one eye:     Problems with dizziness:         Gastrointestinal    Blood in stool:     Vomited blood:         Genitourinary    Burning when urinating:     Blood in urine:  Psychiatric    Major depression:         Hematologic    Bleeding problems:    Problems with blood clotting too easily:        Skin    Rashes or ulcers:        Constitutional    Fever or chills:      PHYSICAL EXAM: Vitals:   03/08/21 1123  BP: (!) 187/87  Pulse: 89  Resp: 16  Temp: 98.2 F (36.8 C)  TempSrc: Temporal  SpO2: 95%  Weight: 133 lb (60.3 kg)  Height: 5\' 2"  (1.575 m)    GENERAL: The patient is a well-nourished female, in no acute distress. The vital signs are documented above. CARDIAC: There is a regular rate and rhythm.  VASCULAR:  Right chest incision with staples - small hematoma Right arm avf with good thrill Right radial pulse palpable PULMONARY: No respiratory distress. MUSCULOSKELETAL: There are no major deformities or cyanosis. NEUROLOGIC: No focal weakness or paresthesias are detected.      DATA:   None  Assessment/Plan:  40 year old female that underwent cephalic vein turndown onto the right axillary vein on 02/25/2021 for an occluded cephalic vein at the arch.  Her postop course was complicated by hematoma requiring washout.  Discussed with her today that her fistula has an excellent thrill.  She does have some residual hematoma under the incision but this is much improved and overall her symptoms are improving.  Her right arm is neurovascularly intact.  I think overall she is on an improving clinical course.  I offered reassurance today.  I have refilled her Percocets for 30 tablets given she is only taking these prior to dialysis.  She has a follow-up with me already on 7/19 for staple removal.   [name], MD [facility]: [phone]

## 2021-03-08 NOTE — Telephone Encounter (Signed)
Patient calls back after office visit today. She sounds tearful. Patient states "I'm feeling really bad, nauseated, dizzy and in a lot of pain. The whole house is just spinning around." Says she has not eaten "because I know I'll get sick, I've already thrown up 3 times". Patient wants to know if she should go to the hospital. Patient says she has nausea medicine but has not taken it, also denies taking percocet. Advised patient to eat something light like crackers with peanut butter and take a percocet. If her stomach is still upset after eating - take her anti-nausea med. Patient verbalizes understanding.

## 2021-03-09 DIAGNOSIS — N186 End stage renal disease: Secondary | ICD-10-CM | POA: Diagnosis not present

## 2021-03-09 DIAGNOSIS — T82590A Other mechanical complication of surgically created arteriovenous fistula, initial encounter: Secondary | ICD-10-CM | POA: Diagnosis not present

## 2021-03-09 DIAGNOSIS — L7632 Postprocedural hematoma of skin and subcutaneous tissue following other procedure: Secondary | ICD-10-CM | POA: Diagnosis not present

## 2021-03-09 DIAGNOSIS — Z992 Dependence on renal dialysis: Secondary | ICD-10-CM | POA: Diagnosis not present

## 2021-03-14 ENCOUNTER — Other Ambulatory Visit: Payer: Self-pay

## 2021-03-14 ENCOUNTER — Telehealth: Payer: Self-pay

## 2021-03-14 NOTE — Telephone Encounter (Signed)
Patient calls today from HD to report pain in her arm. Says the pain hasn't been that bad "but oh my gosh today, I don't know what is going on - it hurts so bad." She took a percocet prior to going to HD - advised she could take another when she got home if the pain was still unmanageable. Patient would like to know if she should go to the hospital. Advised to try oxycodone instead. Patient verbalizes understanding.

## 2021-03-17 DIAGNOSIS — Z992 Dependence on renal dialysis: Secondary | ICD-10-CM | POA: Diagnosis not present

## 2021-03-17 DIAGNOSIS — N186 End stage renal disease: Secondary | ICD-10-CM | POA: Diagnosis not present

## 2021-03-17 DIAGNOSIS — N2581 Secondary hyperparathyroidism of renal origin: Secondary | ICD-10-CM | POA: Diagnosis not present

## 2021-03-17 DIAGNOSIS — E8770 Fluid overload, unspecified: Secondary | ICD-10-CM | POA: Diagnosis not present

## 2021-03-22 ENCOUNTER — Other Ambulatory Visit: Payer: Self-pay | Admitting: Vascular Surgery

## 2021-03-22 ENCOUNTER — Encounter: Payer: Medicare Other | Admitting: Vascular Surgery

## 2021-03-22 MED ORDER — OXYCODONE-ACETAMINOPHEN 5-325 MG PO TABS: 1.0000 | ORAL_TABLET | Freq: Four times a day (QID) | ORAL | 0 refills | Status: DC | PRN

## 2021-03-23 DIAGNOSIS — E1129 Type 2 diabetes mellitus with other diabetic kidney complication: Secondary | ICD-10-CM | POA: Diagnosis not present

## 2021-03-24 ENCOUNTER — Ambulatory Visit (INDEPENDENT_AMBULATORY_CARE_PROVIDER_SITE_OTHER): Payer: Medicare Other | Admitting: Physician Assistant

## 2021-03-24 ENCOUNTER — Other Ambulatory Visit: Payer: Self-pay

## 2021-03-24 VITALS — BP 188/85 | HR 70 | Temp 98.4°F | Resp 20 | Ht 62.0 in | Wt 131.9 lb

## 2021-03-24 DIAGNOSIS — N186 End stage renal disease: Secondary | ICD-10-CM

## 2021-03-24 NOTE — Progress Notes (Signed)
Established Dialysis Access   History of Present Illness   [name] [name] is a 40 y.o. (10/15/1980) female who presents for post operative follow up after washout of right axillary vein exposure site hematoma and primary repair of anastomosis by Dr. Donzetta Matters on 02/25/21. Earlier that day she had undergone cephalic vein turndown to her axillary vein by Dr. Carlis Abbott and she presented to ED with symptomatic hematoma with pain. She was seen in follow up on 03/08/21 by Dr. Carlis Abbott at which time she was having pain in her axilla. No steal symptoms. Otherwise her surgical site and fistula were well appearing. She returns today for staple removal. She explains that her pain is much better. She has some intermittent stabbing pains that come and go. Otherwise no complaints. She is just very anxious and worried about her staples and dialysis going forward.  She is currently dialyzing via a Right IJ TDC at the [facility] location  Current Outpatient Medications  Medication Sig Dispense Refill   acetaminophen (TYLENOL) 500 MG tablet Take 1,000 mg by mouth every 6 (six) hours as needed for mild pain.     amLODipine (NORVASC) 10 MG tablet Take 10 mg by mouth at bedtime.     aspirin EC 81 MG tablet Take 81 mg by mouth every morning.     calcium acetate (PHOSLO) 667 MG capsule Take 1,334-2,001 mg by mouth See admin instructions. Take 2001 mg with each meal and 1334 mg with each snack     colchicine 0.6 MG tablet Take 0.6 mg by mouth daily as needed (gout flares).     cycloSPORINE modified (NEORAL) 100 MG capsule Take 200 mg by mouth 2 (two) times daily.     cycloSPORINE modified (NEORAL) 25 MG capsule Take 25 mg by mouth daily.     diphenhydrAMINE HCl, Sleep, (ZZZQUIL) 25 MG CAPS Take 50 mg by mouth at bedtime as needed (sleep).     furosemide (LASIX) 20 MG tablet Take 40 mg by mouth 4 (four) times a week. Take 40 mg on Tues, Thurs, Sat, and Sun (non-dialysis days)     heparin 1000 unit/mL SOLN injection  Heparin Sodium (Porcine) 1,000 Units/mL Catheter Lock Arterial     HYDROcodone-acetaminophen (NORCO) 5-325 MG tablet Take 1 tablet by mouth every 6 (six) hours as needed for up to 12 doses for severe pain. 10 tablet 0   labetalol (NORMODYNE) 200 MG tablet Take 600 mg by mouth 2 (two) times daily.     lidocaine-prilocaine (EMLA) cream Apply 1 application topically daily as needed (port access).     losartan (COZAAR) 100 MG tablet Take 100 mg by mouth at bedtime.     Methoxy PEG-Epoetin Beta (MIRCERA IJ) Mircera     mycophenolate (MYFORTIC) 180 MG EC tablet Take 540 mg by mouth 2 (two) times daily.     ondansetron (ZOFRAN) 4 MG tablet Take 4 mg by mouth 2 (two) times daily as needed for nausea or vomiting.     oxyCODONE-acetaminophen (PERCOCET/ROXICET) 5-325 MG tablet Take 1-2 tablets by mouth every 6 (six) hours as needed for severe pain. 15 tablet 0   predniSONE (DELTASONE) 5 MG tablet Take 15 mg by mouth daily.  5   zolpidem (AMBIEN) 10 MG tablet Take 10 mg by mouth at bedtime.     No current facility-administered medications for this visit.    On ROS today: negative unless stated in HPI   Physical Examination   Vitals:   03/24/21 0923  BP: Marland Kitchen)  188/85  Pulse: 70  Resp: 20  Temp: 98.4 F (36.9 C)  TempSrc: Temporal  SpO2: 96%  Weight: 131 lb 14.4 oz (59.8 kg)  Height: 5\' 2"  (1.575 m)   Body mass index is 24.12 kg/m.  General Alert, O x 3, WD, NAD  Pulmonary Sym exp, good B air movt, CTA B  Cardiac Regular rate and rhythm , no Murmurs,  Vascular Vessel Right Left  Radial Faintly palpable Palpable  Brachial Palpable Palpable  Ulnar Not palpable Not palpable    Musculo- skeletal Right upper arm fistula with good thrill. Right chest wall incision is healing well. Staples removed   M/S 5/5 throughout  , Extremities without ischemic changes    Neurologic A&O; CN grossly intact     Medical Decision Making   [name] [name] is a 40 y.o. female who presents for follow  up staple removal after washout of right axillary vein exposure site hematoma and primary repair of anastomosis by Dr. Donzetta Matters on 02/25/21. Earlier that day she had undergone cephalic vein turndown to her axillary vein by Dr. Carlis Abbott. Her fistula has excellent thrill. Her incisions are healing well. Staples removed today. Her fistula can be used starting next week 03/28/21 or at discretion of the patient. She will follow up with [place] as needed. Her TDC can be arranged to be removed when the dialysis center feels comfortable with the function of her AV fistula. Please contact us to have this removed when she is ready.    Karoline Caldwell, PA-C [facility] Office: [phone]  Clinic MD: [place]

## 2021-04-04 DIAGNOSIS — E875 Hyperkalemia: Secondary | ICD-10-CM | POA: Diagnosis not present

## 2021-04-04 DIAGNOSIS — T8610 Unspecified complication of kidney transplant: Secondary | ICD-10-CM | POA: Diagnosis not present

## 2021-04-04 DIAGNOSIS — R110 Nausea: Secondary | ICD-10-CM | POA: Diagnosis not present

## 2021-04-04 DIAGNOSIS — Z992 Dependence on renal dialysis: Secondary | ICD-10-CM | POA: Diagnosis not present

## 2021-04-04 DIAGNOSIS — Z23 Encounter for immunization: Secondary | ICD-10-CM | POA: Diagnosis not present

## 2021-04-04 DIAGNOSIS — D509 Iron deficiency anemia, unspecified: Secondary | ICD-10-CM | POA: Diagnosis not present

## 2021-04-04 DIAGNOSIS — N186 End stage renal disease: Secondary | ICD-10-CM | POA: Diagnosis not present

## 2021-04-04 DIAGNOSIS — N2581 Secondary hyperparathyroidism of renal origin: Secondary | ICD-10-CM | POA: Diagnosis not present

## 2021-04-04 DIAGNOSIS — D631 Anemia in chronic kidney disease: Secondary | ICD-10-CM | POA: Diagnosis not present

## 2021-04-04 DIAGNOSIS — T7840XA Allergy, unspecified, initial encounter: Secondary | ICD-10-CM | POA: Diagnosis not present

## 2021-04-12 DIAGNOSIS — M129 Arthropathy, unspecified: Secondary | ICD-10-CM | POA: Diagnosis not present

## 2021-04-12 DIAGNOSIS — T829XXS Unspecified complication of cardiac and vascular prosthetic device, implant and graft, sequela: Secondary | ICD-10-CM | POA: Diagnosis not present

## 2021-04-12 DIAGNOSIS — Z79899 Other long term (current) drug therapy: Secondary | ICD-10-CM | POA: Diagnosis not present

## 2021-04-12 DIAGNOSIS — R109 Unspecified abdominal pain: Secondary | ICD-10-CM | POA: Diagnosis not present

## 2021-04-12 DIAGNOSIS — M5412 Radiculopathy, cervical region: Secondary | ICD-10-CM | POA: Diagnosis not present

## 2021-04-12 DIAGNOSIS — G8929 Other chronic pain: Secondary | ICD-10-CM | POA: Diagnosis not present

## 2021-04-12 DIAGNOSIS — E559 Vitamin D deficiency, unspecified: Secondary | ICD-10-CM | POA: Diagnosis not present

## 2021-04-12 DIAGNOSIS — R030 Elevated blood-pressure reading, without diagnosis of hypertension: Secondary | ICD-10-CM | POA: Diagnosis not present

## 2021-04-26 ENCOUNTER — Other Ambulatory Visit (HOSPITAL_COMMUNITY): Payer: Self-pay | Admitting: Nephrology

## 2021-04-26 DIAGNOSIS — N186 End stage renal disease: Secondary | ICD-10-CM

## 2021-04-27 ENCOUNTER — Telehealth (HOSPITAL_COMMUNITY): Payer: Self-pay

## 2021-04-27 NOTE — Telephone Encounter (Signed)
-----   Message from Annye Asa, MD sent at 04/26/2021  4:23 PM EDT ----- Regarding: RE: Cath removal Thank you for checking with me.    I am good with sedation for catheter removal.  Mir  ----- Message ----- From: Danielle Dess Sent: 04/26/2021  10:53 AM EDT To: Paula Libra Mir, MD Subject: Cath removal                                   Dr. Dwaine Gale,   I received an order from the dialysis center for Ms. Mudrick to have a left chest tunneled cath removed. They are requesting this to be done with sedation. Pt does not want to do it without do to anxiety. I am putting her on next Tues. 8/30 and you are our doc. Just wanted to make sure you are fine with this since we typically do them without sedation.   Thanks,  Lia Foyer

## 2021-05-03 ENCOUNTER — Other Ambulatory Visit: Payer: Self-pay

## 2021-05-03 ENCOUNTER — Encounter (HOSPITAL_COMMUNITY): Payer: Self-pay

## 2021-05-03 ENCOUNTER — Ambulatory Visit (HOSPITAL_COMMUNITY)
Admission: RE | Admit: 2021-05-03 | Discharge: 2021-05-03 | Disposition: A | Discharge status: Home / Self Care | Payer: Medicare Other | Source: Ambulatory Visit | Attending: Nephrology | Admitting: Nephrology

## 2021-05-03 DIAGNOSIS — N186 End stage renal disease: Secondary | ICD-10-CM | POA: Diagnosis not present

## 2021-05-03 DIAGNOSIS — Z4901 Encounter for fitting and adjustment of extracorporeal dialysis catheter: Secondary | ICD-10-CM | POA: Insufficient documentation

## 2021-05-03 HISTORY — PX: IR REMOVAL TUN CV CATH W/O FL: IMG2289

## 2021-05-03 MED ORDER — MIDAZOLAM HCL 2 MG/2ML IJ SOLN
INTRAMUSCULAR | Status: AC | PRN
  Administered 2021-05-03 (×2): 1 mg via INTRAVENOUS

## 2021-05-03 MED ORDER — FENTANYL CITRATE (PF) 100 MCG/2ML IJ SOLN
INTRAMUSCULAR | Status: AC
  Filled 2021-05-03: qty 2

## 2021-05-03 MED ORDER — MIDAZOLAM HCL 2 MG/2ML IJ SOLN
INTRAMUSCULAR | Status: AC
  Filled 2021-05-03: qty 2

## 2021-05-03 MED ORDER — LIDOCAINE HCL (PF) 1 % IJ SOLN
INTRAMUSCULAR | Status: AC | PRN
Start: 2021-05-03 — End: 2021-05-03
  Administered 2021-05-03: 5 mL

## 2021-05-03 MED ORDER — FENTANYL CITRATE (PF) 100 MCG/2ML IJ SOLN
INTRAMUSCULAR | Status: AC | PRN
  Administered 2021-05-03 (×2): 50 ug via INTRAVENOUS

## 2021-05-03 MED ORDER — LIDOCAINE HCL 1 % IJ SOLN
INTRAMUSCULAR | Status: AC
  Filled 2021-05-03: qty 20

## 2021-05-03 NOTE — Sedation Documentation (Signed)
Dr Mir in to speak with pt

## 2021-05-03 NOTE — Sedation Documentation (Signed)
Vital signs stable. 

## 2021-05-03 NOTE — Progress Notes (Signed)
   Patient Status: [facility] - Out-pt  Assessment and Plan: Patient in need of venous access.   Functioning right arm fistula Remove Rt IJ tunneled cath per Dr Moshe Cipro (Placed by Dr Wilmer Floor)   ______________________________________________________________________   History of Present Illness: [name] [name] is a 40 y.o. female   ESRD Dialysis since 2013 Right arm fistula placed 11/17/11 Recent surgery/"declot" to right arm fistula- 02/25/21 Catheter was placed in Rt IJ with Dr Wilmer Floor for interim - healing after surgery  Now using fistula without issue At least x 5 weeks  Request per Dr Hillery Hunter for removal with sedation  Allergies and medications reviewed.   Review of Systems: A 12 point ROS discussed and pertinent positives are indicated in the HPI above.  All other systems are negative.    Vital Signs: BP (!) 206/101 (BP Location: Left Arm)   Pulse 84   Temp 98.4 F (36.9 C) (Oral)   Ht 5\' 2"  (1.575 m)   Wt 127 lb (57.6 kg)   LMP 04/13/2021 (Exact Date)   SpO2 97%   BMI 23.23 kg/m   Physical Exam Vitals reviewed.  HENT:     Mouth/Throat:     Mouth: Mucous membranes are moist.  Cardiovascular:     Rate and Rhythm: Normal rate and regular rhythm.  Pulmonary:     Breath sounds: Normal breath sounds.  Abdominal:     Palpations: Abdomen is soft.  Musculoskeletal:        General: Normal range of motion.  Skin:    General: Skin is warm.  Neurological:     Mental Status: She is alert and oriented to person, place, and time.  Psychiatric:        Behavior: Behavior normal.   BP 189/90 at 1130 am   Imaging reviewed.   Labs:  COAGS: No results for input(s): INR, APTT in the last 8760 hours.  BMP: Recent Labs    05/14/20 0223 08/02/20 0759 02/26/21 0337 02/26/21 1023 03/02/21 1007 03/03/21 2028 03/04/21 0107  NA 140   < > 135  --  133* 129* 133*  K 7.2*   < > 6.8* 5.4* 5.2* 6.2* 6.3*  CL 96*   < > 100  --  95* 90* 96*  CO2 23   < >  28  --  25 23 22   GLUCOSE 103*   < > 103*  --  82 96 84  BUN 112*   < > 33*  --  56* 85* 90*  CALCIUM 9.0   < > 9.6  --  9.5 8.8* 8.9  CREATININE 16.21*   < > 5.44*  --  7.25* 9.68* 10.12*  GFRNONAA 2*   < > 10*  --  7* 5* 5*  GFRAA 3*  --   --   --   --   --   --    < > = values in this interval not displayed.    Pt is scheduled for right IJ tunneled dialysis catheter removal with sedation She is aware of procedure benefits and risks including but not limited to Infection; vessel damage ; bleeding She is agreeable to proceed Consent signed  Note BP--- follows with Dr Moshe Cipro-- to see her at dialysis tomorrow   Electronically Signed: Lavonia Drafts, PA-C 05/03/2021, 11:26 AM   I spent a total of 15 minutes in face to face in clinical consultation, greater than 50% of which was counseling/coordinating care for venous access.

## 2021-05-03 NOTE — Progress Notes (Signed)
Patient was given discharge instructions. She verbalized understanding. 

## 2021-05-03 NOTE — Sedation Documentation (Signed)
Pt denies pain at this time. Pt states that she is extremely nervous for this procedure. Dr called to come to room to see pt

## 2021-05-03 NOTE — Procedures (Signed)
Interventional Radiology Procedure Note  Procedure: HD catheter removal  Indication: Functioning fistula  Findings: Please refer to procedural dictation for full description.  Complications: None  EBL: < 10 mL  Miachel Roux, MD [phone]

## 2021-05-04 DIAGNOSIS — D509 Iron deficiency anemia, unspecified: Secondary | ICD-10-CM | POA: Diagnosis not present

## 2021-05-04 DIAGNOSIS — Z23 Encounter for immunization: Secondary | ICD-10-CM | POA: Diagnosis not present

## 2021-05-04 DIAGNOSIS — T7840XA Allergy, unspecified, initial encounter: Secondary | ICD-10-CM | POA: Diagnosis not present

## 2021-05-04 DIAGNOSIS — E875 Hyperkalemia: Secondary | ICD-10-CM | POA: Diagnosis not present

## 2021-05-04 DIAGNOSIS — R110 Nausea: Secondary | ICD-10-CM | POA: Diagnosis not present

## 2021-05-04 DIAGNOSIS — N186 End stage renal disease: Secondary | ICD-10-CM | POA: Diagnosis not present

## 2021-05-04 DIAGNOSIS — N2581 Secondary hyperparathyroidism of renal origin: Secondary | ICD-10-CM | POA: Diagnosis not present

## 2021-05-04 DIAGNOSIS — Z992 Dependence on renal dialysis: Secondary | ICD-10-CM | POA: Diagnosis not present

## 2021-05-04 DIAGNOSIS — D631 Anemia in chronic kidney disease: Secondary | ICD-10-CM | POA: Diagnosis not present

## 2021-05-05 DIAGNOSIS — Z992 Dependence on renal dialysis: Secondary | ICD-10-CM | POA: Diagnosis not present

## 2021-05-05 DIAGNOSIS — N186 End stage renal disease: Secondary | ICD-10-CM | POA: Diagnosis not present

## 2021-05-05 DIAGNOSIS — T8610 Unspecified complication of kidney transplant: Secondary | ICD-10-CM | POA: Diagnosis not present

## 2021-05-06 DIAGNOSIS — E1129 Type 2 diabetes mellitus with other diabetic kidney complication: Secondary | ICD-10-CM | POA: Diagnosis not present

## 2021-05-06 DIAGNOSIS — N2581 Secondary hyperparathyroidism of renal origin: Secondary | ICD-10-CM | POA: Diagnosis not present

## 2021-05-06 DIAGNOSIS — Z23 Encounter for immunization: Secondary | ICD-10-CM | POA: Diagnosis not present

## 2021-05-06 DIAGNOSIS — D631 Anemia in chronic kidney disease: Secondary | ICD-10-CM | POA: Diagnosis not present

## 2021-05-06 DIAGNOSIS — Z992 Dependence on renal dialysis: Secondary | ICD-10-CM | POA: Diagnosis not present

## 2021-05-06 DIAGNOSIS — E875 Hyperkalemia: Secondary | ICD-10-CM | POA: Diagnosis not present

## 2021-05-06 DIAGNOSIS — R110 Nausea: Secondary | ICD-10-CM | POA: Diagnosis not present

## 2021-05-06 DIAGNOSIS — N186 End stage renal disease: Secondary | ICD-10-CM | POA: Diagnosis not present

## 2021-05-06 DIAGNOSIS — D509 Iron deficiency anemia, unspecified: Secondary | ICD-10-CM | POA: Diagnosis not present

## 2021-05-12 DIAGNOSIS — R109 Unspecified abdominal pain: Secondary | ICD-10-CM | POA: Diagnosis not present

## 2021-05-12 DIAGNOSIS — N926 Irregular menstruation, unspecified: Secondary | ICD-10-CM | POA: Diagnosis not present

## 2021-05-12 DIAGNOSIS — Z79899 Other long term (current) drug therapy: Secondary | ICD-10-CM | POA: Diagnosis not present

## 2021-05-12 DIAGNOSIS — M5412 Radiculopathy, cervical region: Secondary | ICD-10-CM | POA: Diagnosis not present

## 2021-05-12 DIAGNOSIS — K3184 Gastroparesis: Secondary | ICD-10-CM | POA: Diagnosis not present

## 2021-05-12 DIAGNOSIS — E559 Vitamin D deficiency, unspecified: Secondary | ICD-10-CM | POA: Diagnosis not present

## 2021-05-25 DIAGNOSIS — H60331 Swimmer's ear, right ear: Secondary | ICD-10-CM | POA: Diagnosis not present

## 2021-06-04 DIAGNOSIS — T8610 Unspecified complication of kidney transplant: Secondary | ICD-10-CM | POA: Diagnosis not present

## 2021-06-04 DIAGNOSIS — N186 End stage renal disease: Secondary | ICD-10-CM | POA: Diagnosis not present

## 2021-06-04 DIAGNOSIS — Z992 Dependence on renal dialysis: Secondary | ICD-10-CM | POA: Diagnosis not present

## 2021-06-06 DIAGNOSIS — E875 Hyperkalemia: Secondary | ICD-10-CM | POA: Diagnosis not present

## 2021-06-06 DIAGNOSIS — D631 Anemia in chronic kidney disease: Secondary | ICD-10-CM | POA: Diagnosis not present

## 2021-06-06 DIAGNOSIS — N186 End stage renal disease: Secondary | ICD-10-CM | POA: Diagnosis not present

## 2021-06-06 DIAGNOSIS — E1129 Type 2 diabetes mellitus with other diabetic kidney complication: Secondary | ICD-10-CM | POA: Diagnosis not present

## 2021-06-06 DIAGNOSIS — Z992 Dependence on renal dialysis: Secondary | ICD-10-CM | POA: Diagnosis not present

## 2021-06-06 DIAGNOSIS — D509 Iron deficiency anemia, unspecified: Secondary | ICD-10-CM | POA: Diagnosis not present

## 2021-06-06 DIAGNOSIS — N2581 Secondary hyperparathyroidism of renal origin: Secondary | ICD-10-CM | POA: Diagnosis not present

## 2021-06-09 DIAGNOSIS — E559 Vitamin D deficiency, unspecified: Secondary | ICD-10-CM | POA: Diagnosis not present

## 2021-06-09 DIAGNOSIS — M5412 Radiculopathy, cervical region: Secondary | ICD-10-CM | POA: Diagnosis not present

## 2021-06-09 DIAGNOSIS — R109 Unspecified abdominal pain: Secondary | ICD-10-CM | POA: Diagnosis not present

## 2021-06-09 DIAGNOSIS — Z79899 Other long term (current) drug therapy: Secondary | ICD-10-CM | POA: Diagnosis not present

## 2021-06-09 DIAGNOSIS — K3184 Gastroparesis: Secondary | ICD-10-CM | POA: Diagnosis not present

## 2021-06-24 DIAGNOSIS — Z992 Dependence on renal dialysis: Secondary | ICD-10-CM | POA: Diagnosis not present

## 2021-06-24 DIAGNOSIS — E875 Hyperkalemia: Secondary | ICD-10-CM | POA: Diagnosis not present

## 2021-06-24 DIAGNOSIS — N186 End stage renal disease: Secondary | ICD-10-CM | POA: Diagnosis not present

## 2021-06-24 DIAGNOSIS — N2581 Secondary hyperparathyroidism of renal origin: Secondary | ICD-10-CM | POA: Diagnosis not present

## 2021-06-24 DIAGNOSIS — D509 Iron deficiency anemia, unspecified: Secondary | ICD-10-CM | POA: Diagnosis not present

## 2021-06-24 DIAGNOSIS — D631 Anemia in chronic kidney disease: Secondary | ICD-10-CM | POA: Diagnosis not present

## 2021-06-24 DIAGNOSIS — E1129 Type 2 diabetes mellitus with other diabetic kidney complication: Secondary | ICD-10-CM | POA: Diagnosis not present

## 2021-06-27 DIAGNOSIS — N186 End stage renal disease: Secondary | ICD-10-CM | POA: Diagnosis not present

## 2021-06-27 DIAGNOSIS — E875 Hyperkalemia: Secondary | ICD-10-CM | POA: Diagnosis not present

## 2021-06-27 DIAGNOSIS — Z992 Dependence on renal dialysis: Secondary | ICD-10-CM | POA: Diagnosis not present

## 2021-06-27 DIAGNOSIS — N2581 Secondary hyperparathyroidism of renal origin: Secondary | ICD-10-CM | POA: Diagnosis not present

## 2021-06-27 DIAGNOSIS — D509 Iron deficiency anemia, unspecified: Secondary | ICD-10-CM | POA: Diagnosis not present

## 2021-06-27 DIAGNOSIS — E1129 Type 2 diabetes mellitus with other diabetic kidney complication: Secondary | ICD-10-CM | POA: Diagnosis not present

## 2021-06-27 DIAGNOSIS — D631 Anemia in chronic kidney disease: Secondary | ICD-10-CM | POA: Diagnosis not present

## 2021-06-29 DIAGNOSIS — E875 Hyperkalemia: Secondary | ICD-10-CM | POA: Diagnosis not present

## 2021-06-29 DIAGNOSIS — N2581 Secondary hyperparathyroidism of renal origin: Secondary | ICD-10-CM | POA: Diagnosis not present

## 2021-06-29 DIAGNOSIS — D509 Iron deficiency anemia, unspecified: Secondary | ICD-10-CM | POA: Diagnosis not present

## 2021-06-29 DIAGNOSIS — N186 End stage renal disease: Secondary | ICD-10-CM | POA: Diagnosis not present

## 2021-06-29 DIAGNOSIS — E1129 Type 2 diabetes mellitus with other diabetic kidney complication: Secondary | ICD-10-CM | POA: Diagnosis not present

## 2021-06-29 DIAGNOSIS — D631 Anemia in chronic kidney disease: Secondary | ICD-10-CM | POA: Diagnosis not present

## 2021-06-29 DIAGNOSIS — Z992 Dependence on renal dialysis: Secondary | ICD-10-CM | POA: Diagnosis not present

## 2021-07-01 DIAGNOSIS — Z992 Dependence on renal dialysis: Secondary | ICD-10-CM | POA: Diagnosis not present

## 2021-07-01 DIAGNOSIS — E875 Hyperkalemia: Secondary | ICD-10-CM | POA: Diagnosis not present

## 2021-07-01 DIAGNOSIS — N186 End stage renal disease: Secondary | ICD-10-CM | POA: Diagnosis not present

## 2021-07-01 DIAGNOSIS — D509 Iron deficiency anemia, unspecified: Secondary | ICD-10-CM | POA: Diagnosis not present

## 2021-07-01 DIAGNOSIS — D631 Anemia in chronic kidney disease: Secondary | ICD-10-CM | POA: Diagnosis not present

## 2021-07-01 DIAGNOSIS — E1129 Type 2 diabetes mellitus with other diabetic kidney complication: Secondary | ICD-10-CM | POA: Diagnosis not present

## 2021-07-01 DIAGNOSIS — N2581 Secondary hyperparathyroidism of renal origin: Secondary | ICD-10-CM | POA: Diagnosis not present

## 2021-07-05 DIAGNOSIS — N2581 Secondary hyperparathyroidism of renal origin: Secondary | ICD-10-CM | POA: Diagnosis not present

## 2021-07-05 DIAGNOSIS — E1129 Type 2 diabetes mellitus with other diabetic kidney complication: Secondary | ICD-10-CM | POA: Diagnosis not present

## 2021-07-05 DIAGNOSIS — D631 Anemia in chronic kidney disease: Secondary | ICD-10-CM | POA: Diagnosis not present

## 2021-07-05 DIAGNOSIS — N186 End stage renal disease: Secondary | ICD-10-CM | POA: Diagnosis not present

## 2021-07-05 DIAGNOSIS — D509 Iron deficiency anemia, unspecified: Secondary | ICD-10-CM | POA: Diagnosis not present

## 2021-07-05 DIAGNOSIS — Z992 Dependence on renal dialysis: Secondary | ICD-10-CM | POA: Diagnosis not present

## 2021-07-05 DIAGNOSIS — E875 Hyperkalemia: Secondary | ICD-10-CM | POA: Diagnosis not present

## 2021-07-05 DIAGNOSIS — T8610 Unspecified complication of kidney transplant: Secondary | ICD-10-CM | POA: Diagnosis not present

## 2021-07-13 DIAGNOSIS — M5412 Radiculopathy, cervical region: Secondary | ICD-10-CM | POA: Diagnosis not present

## 2021-07-13 DIAGNOSIS — T829XXS Unspecified complication of cardiac and vascular prosthetic device, implant and graft, sequela: Secondary | ICD-10-CM | POA: Diagnosis not present

## 2021-07-13 DIAGNOSIS — Z79899 Other long term (current) drug therapy: Secondary | ICD-10-CM | POA: Diagnosis not present

## 2021-07-13 DIAGNOSIS — K3184 Gastroparesis: Secondary | ICD-10-CM | POA: Diagnosis not present

## 2021-07-13 DIAGNOSIS — E559 Vitamin D deficiency, unspecified: Secondary | ICD-10-CM | POA: Diagnosis not present

## 2021-07-13 DIAGNOSIS — R030 Elevated blood-pressure reading, without diagnosis of hypertension: Secondary | ICD-10-CM | POA: Diagnosis not present

## 2021-08-02 DIAGNOSIS — E875 Hyperkalemia: Secondary | ICD-10-CM | POA: Diagnosis not present

## 2021-08-02 DIAGNOSIS — D631 Anemia in chronic kidney disease: Secondary | ICD-10-CM | POA: Diagnosis not present

## 2021-08-02 DIAGNOSIS — D509 Iron deficiency anemia, unspecified: Secondary | ICD-10-CM | POA: Diagnosis not present

## 2021-08-02 DIAGNOSIS — N186 End stage renal disease: Secondary | ICD-10-CM | POA: Diagnosis not present

## 2021-08-02 DIAGNOSIS — I10 Essential (primary) hypertension: Secondary | ICD-10-CM | POA: Insufficient documentation

## 2021-08-02 DIAGNOSIS — E1129 Type 2 diabetes mellitus with other diabetic kidney complication: Secondary | ICD-10-CM | POA: Diagnosis not present

## 2021-08-02 DIAGNOSIS — N2581 Secondary hyperparathyroidism of renal origin: Secondary | ICD-10-CM | POA: Diagnosis not present

## 2021-08-02 DIAGNOSIS — Z992 Dependence on renal dialysis: Secondary | ICD-10-CM | POA: Diagnosis not present

## 2021-08-03 DIAGNOSIS — Z992 Dependence on renal dialysis: Secondary | ICD-10-CM | POA: Diagnosis not present

## 2021-08-03 DIAGNOSIS — E1129 Type 2 diabetes mellitus with other diabetic kidney complication: Secondary | ICD-10-CM | POA: Diagnosis not present

## 2021-08-03 DIAGNOSIS — E875 Hyperkalemia: Secondary | ICD-10-CM | POA: Diagnosis not present

## 2021-08-03 DIAGNOSIS — D631 Anemia in chronic kidney disease: Secondary | ICD-10-CM | POA: Diagnosis not present

## 2021-08-03 DIAGNOSIS — N186 End stage renal disease: Secondary | ICD-10-CM | POA: Diagnosis not present

## 2021-08-03 DIAGNOSIS — D509 Iron deficiency anemia, unspecified: Secondary | ICD-10-CM | POA: Diagnosis not present

## 2021-08-03 DIAGNOSIS — N2581 Secondary hyperparathyroidism of renal origin: Secondary | ICD-10-CM | POA: Diagnosis not present

## 2021-08-04 DIAGNOSIS — T8610 Unspecified complication of kidney transplant: Secondary | ICD-10-CM | POA: Diagnosis not present

## 2021-08-04 DIAGNOSIS — Z992 Dependence on renal dialysis: Secondary | ICD-10-CM | POA: Diagnosis not present

## 2021-08-04 DIAGNOSIS — N186 End stage renal disease: Secondary | ICD-10-CM | POA: Diagnosis not present

## 2021-08-05 DIAGNOSIS — N2581 Secondary hyperparathyroidism of renal origin: Secondary | ICD-10-CM | POA: Diagnosis not present

## 2021-08-05 DIAGNOSIS — Z992 Dependence on renal dialysis: Secondary | ICD-10-CM | POA: Diagnosis not present

## 2021-08-05 DIAGNOSIS — E875 Hyperkalemia: Secondary | ICD-10-CM | POA: Diagnosis not present

## 2021-08-05 DIAGNOSIS — E1129 Type 2 diabetes mellitus with other diabetic kidney complication: Secondary | ICD-10-CM | POA: Diagnosis not present

## 2021-08-05 DIAGNOSIS — D631 Anemia in chronic kidney disease: Secondary | ICD-10-CM | POA: Diagnosis not present

## 2021-08-05 DIAGNOSIS — N186 End stage renal disease: Secondary | ICD-10-CM | POA: Diagnosis not present

## 2021-08-05 DIAGNOSIS — D509 Iron deficiency anemia, unspecified: Secondary | ICD-10-CM | POA: Diagnosis not present

## 2021-08-11 DIAGNOSIS — E559 Vitamin D deficiency, unspecified: Secondary | ICD-10-CM | POA: Diagnosis not present

## 2021-08-11 DIAGNOSIS — T829XXS Unspecified complication of cardiac and vascular prosthetic device, implant and graft, sequela: Secondary | ICD-10-CM | POA: Diagnosis not present

## 2021-08-11 DIAGNOSIS — G8929 Other chronic pain: Secondary | ICD-10-CM | POA: Diagnosis not present

## 2021-08-11 DIAGNOSIS — Z0000 Encounter for general adult medical examination without abnormal findings: Secondary | ICD-10-CM | POA: Diagnosis not present

## 2021-08-11 DIAGNOSIS — R109 Unspecified abdominal pain: Secondary | ICD-10-CM | POA: Diagnosis not present

## 2021-08-11 DIAGNOSIS — Z79899 Other long term (current) drug therapy: Secondary | ICD-10-CM | POA: Diagnosis not present

## 2021-08-11 DIAGNOSIS — K3184 Gastroparesis: Secondary | ICD-10-CM | POA: Diagnosis not present

## 2021-09-02 DIAGNOSIS — E1129 Type 2 diabetes mellitus with other diabetic kidney complication: Secondary | ICD-10-CM | POA: Diagnosis not present

## 2021-09-02 DIAGNOSIS — D631 Anemia in chronic kidney disease: Secondary | ICD-10-CM | POA: Diagnosis not present

## 2021-09-02 DIAGNOSIS — D509 Iron deficiency anemia, unspecified: Secondary | ICD-10-CM | POA: Diagnosis not present

## 2021-09-02 DIAGNOSIS — E875 Hyperkalemia: Secondary | ICD-10-CM | POA: Diagnosis not present

## 2021-09-02 DIAGNOSIS — N186 End stage renal disease: Secondary | ICD-10-CM | POA: Diagnosis not present

## 2021-09-02 DIAGNOSIS — N2581 Secondary hyperparathyroidism of renal origin: Secondary | ICD-10-CM | POA: Diagnosis not present

## 2021-09-02 DIAGNOSIS — Z992 Dependence on renal dialysis: Secondary | ICD-10-CM | POA: Diagnosis not present

## 2021-09-05 DIAGNOSIS — E1129 Type 2 diabetes mellitus with other diabetic kidney complication: Secondary | ICD-10-CM | POA: Diagnosis not present

## 2021-09-05 DIAGNOSIS — N2581 Secondary hyperparathyroidism of renal origin: Secondary | ICD-10-CM | POA: Diagnosis not present

## 2021-09-05 DIAGNOSIS — N186 End stage renal disease: Secondary | ICD-10-CM | POA: Diagnosis not present

## 2021-09-05 DIAGNOSIS — Z992 Dependence on renal dialysis: Secondary | ICD-10-CM | POA: Diagnosis not present

## 2021-09-05 DIAGNOSIS — E875 Hyperkalemia: Secondary | ICD-10-CM | POA: Diagnosis not present

## 2021-09-05 DIAGNOSIS — D631 Anemia in chronic kidney disease: Secondary | ICD-10-CM | POA: Diagnosis not present

## 2021-09-05 DIAGNOSIS — D509 Iron deficiency anemia, unspecified: Secondary | ICD-10-CM | POA: Diagnosis not present

## 2021-09-07 DIAGNOSIS — D631 Anemia in chronic kidney disease: Secondary | ICD-10-CM | POA: Diagnosis not present

## 2021-09-07 DIAGNOSIS — N2581 Secondary hyperparathyroidism of renal origin: Secondary | ICD-10-CM | POA: Diagnosis not present

## 2021-09-07 DIAGNOSIS — Z992 Dependence on renal dialysis: Secondary | ICD-10-CM | POA: Diagnosis not present

## 2021-09-07 DIAGNOSIS — E1129 Type 2 diabetes mellitus with other diabetic kidney complication: Secondary | ICD-10-CM | POA: Diagnosis not present

## 2021-09-07 DIAGNOSIS — N186 End stage renal disease: Secondary | ICD-10-CM | POA: Diagnosis not present

## 2021-09-07 DIAGNOSIS — E875 Hyperkalemia: Secondary | ICD-10-CM | POA: Diagnosis not present

## 2021-09-09 DIAGNOSIS — Z992 Dependence on renal dialysis: Secondary | ICD-10-CM | POA: Diagnosis not present

## 2021-09-09 DIAGNOSIS — E875 Hyperkalemia: Secondary | ICD-10-CM | POA: Diagnosis not present

## 2021-09-09 DIAGNOSIS — D631 Anemia in chronic kidney disease: Secondary | ICD-10-CM | POA: Diagnosis not present

## 2021-09-09 DIAGNOSIS — E1129 Type 2 diabetes mellitus with other diabetic kidney complication: Secondary | ICD-10-CM | POA: Diagnosis not present

## 2021-09-09 DIAGNOSIS — N186 End stage renal disease: Secondary | ICD-10-CM | POA: Diagnosis not present

## 2021-09-09 DIAGNOSIS — N2581 Secondary hyperparathyroidism of renal origin: Secondary | ICD-10-CM | POA: Diagnosis not present

## 2021-09-12 DIAGNOSIS — N186 End stage renal disease: Secondary | ICD-10-CM | POA: Diagnosis not present

## 2021-09-12 DIAGNOSIS — Z992 Dependence on renal dialysis: Secondary | ICD-10-CM | POA: Diagnosis not present

## 2021-09-12 DIAGNOSIS — N2581 Secondary hyperparathyroidism of renal origin: Secondary | ICD-10-CM | POA: Diagnosis not present

## 2021-09-12 DIAGNOSIS — E1129 Type 2 diabetes mellitus with other diabetic kidney complication: Secondary | ICD-10-CM | POA: Diagnosis not present

## 2021-09-12 DIAGNOSIS — E875 Hyperkalemia: Secondary | ICD-10-CM | POA: Diagnosis not present

## 2021-09-12 DIAGNOSIS — D631 Anemia in chronic kidney disease: Secondary | ICD-10-CM | POA: Diagnosis not present

## 2021-09-14 DIAGNOSIS — E1129 Type 2 diabetes mellitus with other diabetic kidney complication: Secondary | ICD-10-CM | POA: Diagnosis not present

## 2021-09-14 DIAGNOSIS — D631 Anemia in chronic kidney disease: Secondary | ICD-10-CM | POA: Diagnosis not present

## 2021-09-14 DIAGNOSIS — E875 Hyperkalemia: Secondary | ICD-10-CM | POA: Diagnosis not present

## 2021-09-14 DIAGNOSIS — N2581 Secondary hyperparathyroidism of renal origin: Secondary | ICD-10-CM | POA: Diagnosis not present

## 2021-09-14 DIAGNOSIS — Z992 Dependence on renal dialysis: Secondary | ICD-10-CM | POA: Diagnosis not present

## 2021-09-14 DIAGNOSIS — N186 End stage renal disease: Secondary | ICD-10-CM | POA: Diagnosis not present

## 2021-09-15 ENCOUNTER — Other Ambulatory Visit: Payer: Self-pay

## 2021-09-15 DIAGNOSIS — M5412 Radiculopathy, cervical region: Secondary | ICD-10-CM | POA: Diagnosis not present

## 2021-09-15 DIAGNOSIS — Z9483 Pancreas transplant status: Secondary | ICD-10-CM | POA: Diagnosis not present

## 2021-09-15 DIAGNOSIS — R109 Unspecified abdominal pain: Secondary | ICD-10-CM | POA: Diagnosis not present

## 2021-09-15 DIAGNOSIS — Z79899 Other long term (current) drug therapy: Secondary | ICD-10-CM | POA: Diagnosis not present

## 2021-09-15 DIAGNOSIS — Z940 Kidney transplant status: Secondary | ICD-10-CM | POA: Diagnosis not present

## 2021-09-15 DIAGNOSIS — N186 End stage renal disease: Secondary | ICD-10-CM

## 2021-09-16 DIAGNOSIS — E875 Hyperkalemia: Secondary | ICD-10-CM | POA: Diagnosis not present

## 2021-09-16 DIAGNOSIS — E1129 Type 2 diabetes mellitus with other diabetic kidney complication: Secondary | ICD-10-CM | POA: Diagnosis not present

## 2021-09-16 DIAGNOSIS — N2581 Secondary hyperparathyroidism of renal origin: Secondary | ICD-10-CM | POA: Diagnosis not present

## 2021-09-16 DIAGNOSIS — D631 Anemia in chronic kidney disease: Secondary | ICD-10-CM | POA: Diagnosis not present

## 2021-09-16 DIAGNOSIS — Z992 Dependence on renal dialysis: Secondary | ICD-10-CM | POA: Diagnosis not present

## 2021-09-16 DIAGNOSIS — N186 End stage renal disease: Secondary | ICD-10-CM | POA: Diagnosis not present

## 2021-09-19 DIAGNOSIS — N186 End stage renal disease: Secondary | ICD-10-CM | POA: Diagnosis not present

## 2021-09-19 DIAGNOSIS — Z992 Dependence on renal dialysis: Secondary | ICD-10-CM | POA: Diagnosis not present

## 2021-09-19 DIAGNOSIS — N2581 Secondary hyperparathyroidism of renal origin: Secondary | ICD-10-CM | POA: Diagnosis not present

## 2021-09-19 DIAGNOSIS — E875 Hyperkalemia: Secondary | ICD-10-CM | POA: Diagnosis not present

## 2021-09-19 DIAGNOSIS — D631 Anemia in chronic kidney disease: Secondary | ICD-10-CM | POA: Diagnosis not present

## 2021-09-19 DIAGNOSIS — E1129 Type 2 diabetes mellitus with other diabetic kidney complication: Secondary | ICD-10-CM | POA: Diagnosis not present

## 2021-09-19 NOTE — Progress Notes (Deleted)
[facility] [place]  ASSESSMENT / PLAN: 41 y.o. female with *** - ***  CHIEF COMPLAINT: ***  HISTORY OF PRESENT ILLNESS: [name] [name] is a 41 y.o. 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 {FINDINGS; POSITIVE NEGATIVE:([phone]} history of coronary artery disease. *** history of PCI. *** history of CABG.  {FINDINGS; POSITIVE NEGATIVE:([phone]} history of diabetes mellitus. Last A1c ***. {FINDINGS; POSITIVE NEGATIVE:([phone] [phone]} history of smoking. *** actively smoking. {FINDINGS; POSITIVE NEGATIVE:([phone]} history of hypertension. *** drug regimen with *** control. {FINDINGS; POSITIVE NEGATIVE:([phone] [phone]} history of chronic kidney disease.  Last GFR ***. CKD {stage:30421363}. {FINDINGS; POSITIVE NEGATIVE:([phone] 578-0027} history of chronic obstructive pulmonary disease, treated with ***.  FUNCTIONAL STATUS: ECOG performance status: {findings; ecog performance status:31780} Ambulatory status: {TNHAmbulation:25868}  Past Medical History:  Diagnosis Date   Anemia    Anxiety    ESRD (end stage renal disease) ([place])    Henmodialysis   Gastroparesis    Hypertension    Narcotic abuse ([place])    Non compliance with medical treatment    Renal disorder 2015   transplant   Vision loss     Past Surgical History:  Procedure Laterality Date   A/V FISTULAGRAM Right 02/17/2021   Procedure: A/V FISTULAGRAM;  Surgeon: [name], MD;  Location: [place] [facility];  Service: Cardiovascular;  Laterality: Right;    AV FISTULA PLACEMENT  11/17/2011   Procedure: ARTERIOVENOUS (AV) FISTULA CREATION;  Surgeon: Rosetta Posner, MD;  Location: [place];  Service: Vascular;  Laterality: Right;   EYE SURGERY     HEMATOMA EVACUATION Right 02/25/2021   Procedure: EVACUATION HEMATOMA RIGHT CHEST;  Surgeon: Waynetta Sandy, MD;  Location: [place];  Service: Vascular;  Laterality: Right;   INSERTION OF DIALYSIS CATHETER Right 12/08/2020   Procedure: INSERTION OF TUNNELED DIALYSIS CATHETER;  Surgeon: [name], MD;  Location: [facility];  Service: Vascular;  Laterality: Right;   IR REMOVAL TUN CV CATH W/O FL  05/03/2021   KIDNEY TRANSPLANT     NEPHRECTOMY TRANSPLANTED ORGAN     pancrease transplant     PERIPHERAL VASCULAR BALLOON ANGIOPLASTY Right 02/17/2021   Procedure: PERIPHERAL VASCULAR BALLOON ANGIOPLASTY;  Surgeon: [name], MD;  Location: [place] [facility];  Service: Cardiovascular;  Laterality: Right;   REFRACTIVE SURGERY     REVISON OF ARTERIOVENOUS FISTULA Right 12/08/2020   Procedure: RIGHT ARM ARTERIOVENOUS FISTULA REVISION AND RESECTION;  Surgeon: [name], MD;  Location: [facility] OR;  Service: Vascular;  Laterality: Right;   REVISON OF ARTERIOVENOUS FISTULA Right 02/25/2021   Procedure: RIGHT ARM ARTERIOVENOUS FISTULA REVISION WITH TRANSPOSITION OF CEPHALIC VEIN ON AXILLARY VEIN;  Surgeon: [name], MD;  Location: [facility] OR;  Service: Vascular;  Laterality: Right;    Family History  Problem Relation Age of Onset   Hypertension Father     Social History   Socioeconomic History   Marital status: Married    Spouse name: Not on file   Number of children: Not on file   Years of education: Not on file   Highest education level: Not on file  Occupational History   Not on  file  Tobacco Use   Smoking status: Former    Types: Pipe   Smokeless tobacco: Never   Tobacco comments:    Hooka  Vaping Use   Vaping Use: Never used  Substance and Sexual Activity   Alcohol  use: No   Drug use: No   Sexual activity: Not Currently  Other Topics Concern   Not on file  Social History Narrative   Worked for [employer]--as did husband. Had to leave [place] for safety reasons. Has been in [place] since 9/09.   Social Determinants of Health   Financial Resource Strain: Not on file  Food Insecurity: Not on file  Transportation Needs: Not on file  Physical Activity: Not on file  Stress: Not on file  Social Connections: Not on file  Intimate Partner Violence: Not on file    No Known Allergies  Current Outpatient Medications  Medication Sig Dispense Refill   amLODipine (NORVASC) 10 MG tablet Take 10 mg by mouth at bedtime.     aspirin EC 81 MG tablet Take 81 mg by mouth daily.     colchicine 0.6 MG tablet Take 0.6 mg by mouth 3 (three) times daily as needed (gout flares).     cycloSPORINE modified (NEORAL) 100 MG capsule Take 200 mg by mouth 2 (two) times daily.     cycloSPORINE modified (NEORAL) 25 MG capsule Take 25 mg by mouth every evening.     diphenhydrAMINE (BENADRYL) 25 MG tablet Take 25 mg by mouth every 6 (six) hours as needed for itching.     ferric citrate (AURYXIA) 1 GM 210 MG(Fe) tablet Take 420-630 mg by mouth See admin instructions. Take 630 mg with each meal and 420 mg with each snack     furosemide (LASIX) 20 MG tablet Take 40 mg by mouth 4 (four) times a week. Take 40 mg on Tues, Thurs, Sat, and Sun (non-dialysis days)     heparin 1000 unit/mL SOLN injection Heparin Sodium (Porcine) 1,000 Units/mL Catheter Lock Arterial     HYDROcodone-acetaminophen (NORCO) 5-325 MG tablet Take 1 tablet by mouth every 6 (six) hours as needed for up to 12 doses for severe pain. (Patient not taking: No sig reported) 10 tablet 0   labetalol (NORMODYNE) 200 MG tablet Take 400 mg by mouth 2 (two) times daily.     lidocaine-prilocaine (EMLA) cream Apply 1 application topically daily as needed (port access).     losartan (COZAAR) 100 MG tablet Take 100 mg by mouth at bedtime.      Methoxy PEG-Epoetin Beta (MIRCERA IJ) Mircera     mycophenolate (MYFORTIC) 180 MG EC tablet Take 540 mg by mouth 2 (two) times daily.     naloxone (NARCAN) nasal spray 4 mg/0.1 mL Place 1 spray into the nose as needed (opioid overdose).     ondansetron (ZOFRAN) 4 MG tablet Take 4 mg by mouth 2 (two) times daily as needed for nausea or vomiting.     oxyCODONE-acetaminophen (PERCOCET/ROXICET) 5-325 MG tablet Take 1-2 tablets by mouth every 6 (six) hours as needed for severe pain. (Patient taking differently: Take 1 tablet by mouth 2 (two) times daily.) 15 tablet 0   predniSONE (DELTASONE) 5 MG tablet Take 15 mg by mouth daily.  5   zolpidem (AMBIEN) 10 MG tablet Take 10 mg by mouth at bedtime.     No current facility-administered medications for this visit.    REVIEW OF SYSTEMS:  [X]  denotes positive finding, [ ]  denotes negative finding Cardiac  Comments:  Chest pain or chest pressure: ***   Shortness of breath upon exertion:    Short of breath when lying flat:    Irregular heart rhythm:        Vascular    Pain in calf, thigh, or hip brought on by ambulation:    Pain in feet at night that wakes you up from your sleep:     Blood clot in your veins:    Leg swelling:         Pulmonary    Oxygen at home:    Productive cough:     Wheezing:         Neurologic    Sudden weakness in arms or legs:     Sudden numbness in arms or legs:     Sudden onset of difficulty speaking or slurred speech:    Temporary loss of vision in one eye:     Problems with dizziness:         Gastrointestinal    Blood in stool:     Vomited blood:         Genitourinary    Burning when urinating:     Blood in urine:        Psychiatric    Major depression:         Hematologic    Bleeding problems:    Problems with blood clotting too easily:        Skin    Rashes or ulcers:        Constitutional    Fever or chills:      PHYSICAL EXAM There were no vitals filed for this  visit.  Constitutional: *** appearing. *** distress. Appears *** nourished.  Neurologic: CN ***. *** focal findings. *** sensory loss. Psychiatric: *** Mood and affect symmetric and appropriate. Eyes: *** No icterus. No conjunctival pallor. Ears, nose, throat: *** mucous membranes moist. Midline trachea.  Cardiac: *** rate and rhythm.  Respiratory: *** unlabored. Abdominal: *** soft, non-tender, non-distended.  Peripheral vascular: *** Extremity: *** edema. *** cyanosis. *** pallor.  Skin: *** gangrene. *** ulceration.  Lymphatic: *** Stemmer's sign. *** palpable lymphadenopathy.  PERTINENT LABORATORY AND RADIOLOGIC DATA  Most recent CBC CBC Latest Ref Rng & Units 03/03/2021 03/02/2021 02/25/2021  WBC 4.0 - 10.5 K/uL 3.1(L) 3.0(L) -  Hemoglobin 12.0 - 15.0 g/dL 7.5(L) 7.7(L) 8.8(L)  Hematocrit 36.0 - 46.0 % 22.9(L) 24.1(L) 26.0(L)  Platelets 150 - 400 K/uL 127(L) 126(L) -     Most recent CMP CMP Latest Ref Rng & Units 03/04/2021 03/03/2021 03/02/2021  Glucose 70 - 99 mg/dL 84 96 82  BUN 6 - 20 mg/dL 90(H) 85(H) 56(H)  Creatinine 0.44 - 1.00 mg/dL 10.12(H) 9.68(H) 7.25(H)  Sodium 135 - 145 mmol/L 133(L) 129(L) 133(L)  Potassium 3.5 - 5.1 mmol/L 6.3(HH) 6.2(H) 5.2(H)  Chloride 98 - 111 mmol/L 96(L) 90(L) 95(L)  CO2 22 - 32 mmol/L 22 23 25   Calcium 8.9 - 10.3 mg/dL 8.9 8.8(L) 9.5  Total Protein 6.5 - 8.1 g/dL - - -  Total Bilirubin 0.3 - 1.2 mg/dL - - -  Alkaline Phos 38 - 126 U/L - - -  AST 15 - 41 U/L - - -  ALT 0 - 44 U/L - - -    Renal function CrCl cannot be calculated (Patient's most recent lab result is older than the maximum 21 days allowed.).  Hgb A1c MFr Bld (%)  Date Value  12/20/2018 4.6 (L)    No results found  for: LDLCALC, LDLC, HIRISKLDL, POCLDL, LDLDIRECT, REALLDLC, TOTLDLC   Vascular Imaging: ***  Lillianna Sabel N. Stanford Breed, MD [facility] Phone Number: [phone] 09/19/2021 8:00 PM  Total time spent on preparing this  encounter including chart review, data review, collecting history, examining the patient, coordinating care for this {tnhtimebilling:26202}  Portions of this report may have been transcribed using voice recognition software.  Every effort has been made to ensure accuracy; however, inadvertent computerized transcription errors may still be present.

## 2021-09-20 ENCOUNTER — Other Ambulatory Visit (HOSPITAL_COMMUNITY): Payer: Medicare Other

## 2021-09-20 ENCOUNTER — Ambulatory Visit: Payer: Medicare Other | Admitting: Vascular Surgery

## 2021-09-21 DIAGNOSIS — N2581 Secondary hyperparathyroidism of renal origin: Secondary | ICD-10-CM | POA: Diagnosis not present

## 2021-09-21 DIAGNOSIS — N186 End stage renal disease: Secondary | ICD-10-CM | POA: Diagnosis not present

## 2021-09-21 DIAGNOSIS — E1129 Type 2 diabetes mellitus with other diabetic kidney complication: Secondary | ICD-10-CM | POA: Diagnosis not present

## 2021-09-21 DIAGNOSIS — Z992 Dependence on renal dialysis: Secondary | ICD-10-CM | POA: Diagnosis not present

## 2021-09-21 DIAGNOSIS — D631 Anemia in chronic kidney disease: Secondary | ICD-10-CM | POA: Diagnosis not present

## 2021-09-21 DIAGNOSIS — E875 Hyperkalemia: Secondary | ICD-10-CM | POA: Diagnosis not present

## 2021-09-23 DIAGNOSIS — D631 Anemia in chronic kidney disease: Secondary | ICD-10-CM | POA: Diagnosis not present

## 2021-09-23 DIAGNOSIS — Z992 Dependence on renal dialysis: Secondary | ICD-10-CM | POA: Diagnosis not present

## 2021-09-23 DIAGNOSIS — E1129 Type 2 diabetes mellitus with other diabetic kidney complication: Secondary | ICD-10-CM | POA: Diagnosis not present

## 2021-09-23 DIAGNOSIS — N186 End stage renal disease: Secondary | ICD-10-CM | POA: Diagnosis not present

## 2021-09-23 DIAGNOSIS — N2581 Secondary hyperparathyroidism of renal origin: Secondary | ICD-10-CM | POA: Diagnosis not present

## 2021-09-23 DIAGNOSIS — E875 Hyperkalemia: Secondary | ICD-10-CM | POA: Diagnosis not present

## 2021-09-26 DIAGNOSIS — N186 End stage renal disease: Secondary | ICD-10-CM | POA: Diagnosis not present

## 2021-09-26 DIAGNOSIS — N2581 Secondary hyperparathyroidism of renal origin: Secondary | ICD-10-CM | POA: Diagnosis not present

## 2021-09-26 DIAGNOSIS — Z992 Dependence on renal dialysis: Secondary | ICD-10-CM | POA: Diagnosis not present

## 2021-09-26 DIAGNOSIS — E875 Hyperkalemia: Secondary | ICD-10-CM | POA: Diagnosis not present

## 2021-09-26 DIAGNOSIS — E1129 Type 2 diabetes mellitus with other diabetic kidney complication: Secondary | ICD-10-CM | POA: Diagnosis not present

## 2021-09-26 DIAGNOSIS — D631 Anemia in chronic kidney disease: Secondary | ICD-10-CM | POA: Diagnosis not present

## 2021-09-28 DIAGNOSIS — Z992 Dependence on renal dialysis: Secondary | ICD-10-CM | POA: Diagnosis not present

## 2021-09-28 DIAGNOSIS — D631 Anemia in chronic kidney disease: Secondary | ICD-10-CM | POA: Diagnosis not present

## 2021-09-28 DIAGNOSIS — E875 Hyperkalemia: Secondary | ICD-10-CM | POA: Diagnosis not present

## 2021-09-28 DIAGNOSIS — N186 End stage renal disease: Secondary | ICD-10-CM | POA: Diagnosis not present

## 2021-09-28 DIAGNOSIS — N2581 Secondary hyperparathyroidism of renal origin: Secondary | ICD-10-CM | POA: Diagnosis not present

## 2021-09-28 DIAGNOSIS — E1129 Type 2 diabetes mellitus with other diabetic kidney complication: Secondary | ICD-10-CM | POA: Diagnosis not present

## 2021-09-29 ENCOUNTER — Ambulatory Visit: Payer: Medicare Other | Admitting: Obstetrics & Gynecology

## 2021-09-30 DIAGNOSIS — E875 Hyperkalemia: Secondary | ICD-10-CM | POA: Diagnosis not present

## 2021-09-30 DIAGNOSIS — N186 End stage renal disease: Secondary | ICD-10-CM | POA: Diagnosis not present

## 2021-09-30 DIAGNOSIS — Z992 Dependence on renal dialysis: Secondary | ICD-10-CM | POA: Diagnosis not present

## 2021-09-30 DIAGNOSIS — N2581 Secondary hyperparathyroidism of renal origin: Secondary | ICD-10-CM | POA: Diagnosis not present

## 2021-09-30 DIAGNOSIS — D631 Anemia in chronic kidney disease: Secondary | ICD-10-CM | POA: Diagnosis not present

## 2021-09-30 DIAGNOSIS — E1129 Type 2 diabetes mellitus with other diabetic kidney complication: Secondary | ICD-10-CM | POA: Diagnosis not present

## 2021-10-03 DIAGNOSIS — N186 End stage renal disease: Secondary | ICD-10-CM | POA: Diagnosis not present

## 2021-10-03 DIAGNOSIS — D509 Iron deficiency anemia, unspecified: Secondary | ICD-10-CM | POA: Diagnosis not present

## 2021-10-03 DIAGNOSIS — N2581 Secondary hyperparathyroidism of renal origin: Secondary | ICD-10-CM | POA: Diagnosis not present

## 2021-10-03 DIAGNOSIS — E875 Hyperkalemia: Secondary | ICD-10-CM | POA: Diagnosis not present

## 2021-10-03 DIAGNOSIS — Z992 Dependence on renal dialysis: Secondary | ICD-10-CM | POA: Diagnosis not present

## 2021-10-03 DIAGNOSIS — D631 Anemia in chronic kidney disease: Secondary | ICD-10-CM | POA: Diagnosis not present

## 2021-10-03 DIAGNOSIS — E1129 Type 2 diabetes mellitus with other diabetic kidney complication: Secondary | ICD-10-CM | POA: Diagnosis not present

## 2021-10-11 DIAGNOSIS — R030 Elevated blood-pressure reading, without diagnosis of hypertension: Secondary | ICD-10-CM | POA: Diagnosis not present

## 2021-10-11 DIAGNOSIS — G8929 Other chronic pain: Secondary | ICD-10-CM | POA: Diagnosis not present

## 2021-10-11 DIAGNOSIS — Z79899 Other long term (current) drug therapy: Secondary | ICD-10-CM | POA: Diagnosis not present

## 2021-10-11 DIAGNOSIS — M5412 Radiculopathy, cervical region: Secondary | ICD-10-CM | POA: Diagnosis not present

## 2021-10-11 DIAGNOSIS — R109 Unspecified abdominal pain: Secondary | ICD-10-CM | POA: Diagnosis not present

## 2021-10-11 DIAGNOSIS — K3184 Gastroparesis: Secondary | ICD-10-CM | POA: Diagnosis not present

## 2021-10-18 ENCOUNTER — Encounter (HOSPITAL_COMMUNITY): Payer: Self-pay

## 2021-10-25 ENCOUNTER — Other Ambulatory Visit (HOSPITAL_COMMUNITY): Payer: Medicare Other

## 2021-10-25 ENCOUNTER — Ambulatory Visit: Payer: Medicare Other | Admitting: Vascular Surgery

## 2021-11-02 DIAGNOSIS — I639 Cerebral infarction, unspecified: Secondary | ICD-10-CM

## 2021-11-02 HISTORY — DX: Cerebral infarction, unspecified: I63.9

## 2021-11-05 ENCOUNTER — Inpatient Hospital Stay (HOSPITAL_COMMUNITY)
Admission: EM | Admit: 2021-11-05 | Discharge: 2021-11-17 | DRG: 871 | Disposition: A | Discharge status: Home Health | Payer: Medicare Other | Attending: Internal Medicine | Admitting: Internal Medicine

## 2021-11-05 DIAGNOSIS — Z940 Kidney transplant status: Secondary | ICD-10-CM

## 2021-11-05 DIAGNOSIS — Z992 Dependence on renal dialysis: Secondary | ICD-10-CM

## 2021-11-05 DIAGNOSIS — U071 COVID-19: Secondary | ICD-10-CM | POA: Diagnosis present

## 2021-11-05 DIAGNOSIS — Z0189 Encounter for other specified special examinations: Secondary | ICD-10-CM

## 2021-11-05 DIAGNOSIS — R7989 Other specified abnormal findings of blood chemistry: Secondary | ICD-10-CM

## 2021-11-05 DIAGNOSIS — G928 Other toxic encephalopathy: Secondary | ICD-10-CM | POA: Diagnosis present

## 2021-11-05 DIAGNOSIS — D689 Coagulation defect, unspecified: Secondary | ICD-10-CM | POA: Diagnosis present

## 2021-11-05 DIAGNOSIS — E7220 Disorder of urea cycle metabolism, unspecified: Secondary | ICD-10-CM | POA: Diagnosis present

## 2021-11-05 DIAGNOSIS — Z91041 Radiographic dye allergy status: Secondary | ICD-10-CM

## 2021-11-05 DIAGNOSIS — R509 Fever, unspecified: Secondary | ICD-10-CM

## 2021-11-05 DIAGNOSIS — R52 Pain, unspecified: Secondary | ICD-10-CM

## 2021-11-05 DIAGNOSIS — R6520 Severe sepsis without septic shock: Secondary | ICD-10-CM | POA: Diagnosis present

## 2021-11-05 DIAGNOSIS — I6200 Nontraumatic subdural hemorrhage, unspecified: Secondary | ICD-10-CM | POA: Diagnosis present

## 2021-11-05 DIAGNOSIS — Z8249 Family history of ischemic heart disease and other diseases of the circulatory system: Secondary | ICD-10-CM

## 2021-11-05 DIAGNOSIS — Z9115 Patient's noncompliance with renal dialysis: Secondary | ICD-10-CM

## 2021-11-05 DIAGNOSIS — E872 Acidosis, unspecified: Secondary | ICD-10-CM | POA: Diagnosis present

## 2021-11-05 DIAGNOSIS — Z79624 Long term (current) use of inhibitors of nucleotide synthesis: Secondary | ICD-10-CM

## 2021-11-05 DIAGNOSIS — D6959 Other secondary thrombocytopenia: Secondary | ICD-10-CM | POA: Diagnosis present

## 2021-11-05 DIAGNOSIS — I160 Hypertensive urgency: Secondary | ICD-10-CM | POA: Diagnosis present

## 2021-11-05 DIAGNOSIS — E8770 Fluid overload, unspecified: Secondary | ICD-10-CM | POA: Diagnosis present

## 2021-11-05 DIAGNOSIS — N186 End stage renal disease: Secondary | ICD-10-CM | POA: Diagnosis present

## 2021-11-05 DIAGNOSIS — J810 Acute pulmonary edema: Secondary | ICD-10-CM | POA: Diagnosis present

## 2021-11-05 DIAGNOSIS — Z7982 Long term (current) use of aspirin: Secondary | ICD-10-CM

## 2021-11-05 DIAGNOSIS — E875 Hyperkalemia: Secondary | ICD-10-CM | POA: Diagnosis present

## 2021-11-05 DIAGNOSIS — Z9114 Patient's other noncompliance with medication regimen: Secondary | ICD-10-CM

## 2021-11-05 DIAGNOSIS — K3184 Gastroparesis: Secondary | ICD-10-CM | POA: Diagnosis present

## 2021-11-05 DIAGNOSIS — R778 Other specified abnormalities of plasma proteins: Secondary | ICD-10-CM | POA: Diagnosis present

## 2021-11-05 DIAGNOSIS — I3139 Other pericardial effusion (noninflammatory): Secondary | ICD-10-CM | POA: Diagnosis present

## 2021-11-05 DIAGNOSIS — R404 Transient alteration of awareness: Secondary | ICD-10-CM | POA: Diagnosis not present

## 2021-11-05 DIAGNOSIS — I120 Hypertensive chronic kidney disease with stage 5 chronic kidney disease or end stage renal disease: Secondary | ICD-10-CM | POA: Diagnosis present

## 2021-11-05 DIAGNOSIS — A4189 Other specified sepsis: Secondary | ICD-10-CM | POA: Diagnosis not present

## 2021-11-05 DIAGNOSIS — N19 Unspecified kidney failure: Principal | ICD-10-CM

## 2021-11-05 DIAGNOSIS — E1022 Type 1 diabetes mellitus with diabetic chronic kidney disease: Secondary | ICD-10-CM | POA: Diagnosis present

## 2021-11-05 DIAGNOSIS — Z7952 Long term (current) use of systemic steroids: Secondary | ICD-10-CM

## 2021-11-05 DIAGNOSIS — J811 Chronic pulmonary edema: Secondary | ICD-10-CM

## 2021-11-05 DIAGNOSIS — K56609 Unspecified intestinal obstruction, unspecified as to partial versus complete obstruction: Secondary | ICD-10-CM

## 2021-11-05 DIAGNOSIS — G9340 Encephalopathy, unspecified: Secondary | ICD-10-CM | POA: Diagnosis present

## 2021-11-05 DIAGNOSIS — E1043 Type 1 diabetes mellitus with diabetic autonomic (poly)neuropathy: Secondary | ICD-10-CM | POA: Diagnosis present

## 2021-11-05 DIAGNOSIS — Y830 Surgical operation with transplant of whole organ as the cause of abnormal reaction of the patient, or of later complication, without mention of misadventure at the time of the procedure: Secondary | ICD-10-CM | POA: Diagnosis present

## 2021-11-05 DIAGNOSIS — Z87891 Personal history of nicotine dependence: Secondary | ICD-10-CM

## 2021-11-05 DIAGNOSIS — T402X1A Poisoning by other opioids, accidental (unintentional), initial encounter: Secondary | ICD-10-CM | POA: Diagnosis present

## 2021-11-05 DIAGNOSIS — Z79899 Other long term (current) drug therapy: Secondary | ICD-10-CM

## 2021-11-05 DIAGNOSIS — E889 Metabolic disorder, unspecified: Secondary | ICD-10-CM | POA: Diagnosis present

## 2021-11-05 DIAGNOSIS — E1142 Type 2 diabetes mellitus with diabetic polyneuropathy: Secondary | ICD-10-CM

## 2021-11-05 DIAGNOSIS — R0602 Shortness of breath: Secondary | ICD-10-CM

## 2021-11-05 DIAGNOSIS — E1042 Type 1 diabetes mellitus with diabetic polyneuropathy: Secondary | ICD-10-CM | POA: Diagnosis present

## 2021-11-05 DIAGNOSIS — F1110 Opioid abuse, uncomplicated: Secondary | ICD-10-CM | POA: Diagnosis present

## 2021-11-05 DIAGNOSIS — F419 Anxiety disorder, unspecified: Secondary | ICD-10-CM | POA: Diagnosis present

## 2021-11-05 DIAGNOSIS — Z79621 Long term (current) use of calcineurin inhibitor: Secondary | ICD-10-CM

## 2021-11-05 DIAGNOSIS — R4182 Altered mental status, unspecified: Secondary | ICD-10-CM

## 2021-11-05 DIAGNOSIS — D631 Anemia in chronic kidney disease: Secondary | ICD-10-CM | POA: Diagnosis present

## 2021-11-05 DIAGNOSIS — R0902 Hypoxemia: Secondary | ICD-10-CM | POA: Diagnosis present

## 2021-11-05 DIAGNOSIS — D84821 Immunodeficiency due to drugs: Secondary | ICD-10-CM | POA: Diagnosis present

## 2021-11-05 DIAGNOSIS — M109 Gout, unspecified: Secondary | ICD-10-CM | POA: Diagnosis present

## 2021-11-05 DIAGNOSIS — R17 Unspecified jaundice: Secondary | ICD-10-CM | POA: Diagnosis present

## 2021-11-05 DIAGNOSIS — T8612 Kidney transplant failure: Secondary | ICD-10-CM | POA: Diagnosis present

## 2021-11-05 DIAGNOSIS — T361X5A Adverse effect of cephalosporins and other beta-lactam antibiotics, initial encounter: Secondary | ICD-10-CM | POA: Diagnosis present

## 2021-11-05 DIAGNOSIS — J1282 Pneumonia due to coronavirus disease 2019: Secondary | ICD-10-CM | POA: Diagnosis present

## 2021-11-05 DIAGNOSIS — I10 Essential (primary) hypertension: Secondary | ICD-10-CM | POA: Diagnosis not present

## 2021-11-05 DIAGNOSIS — R1310 Dysphagia, unspecified: Secondary | ICD-10-CM | POA: Diagnosis present

## 2021-11-05 DIAGNOSIS — T50911A Poisoning by multiple unspecified drugs, medicaments and biological substances, accidental (unintentional), initial encounter: Secondary | ICD-10-CM | POA: Diagnosis present

## 2021-11-05 DIAGNOSIS — Z28310 Unvaccinated for covid-19: Secondary | ICD-10-CM

## 2021-11-05 LAB — CBG MONITORING, ED: Glucose-Capillary: 74 mg/dL (ref 70–99)

## 2021-11-05 LAB — I-STAT CHEM 8, ED
BUN: 62 mg/dL — ABNORMAL HIGH (ref 6–20)
Calcium, Ion: 0.95 mmol/L — ABNORMAL LOW (ref 1.15–1.40)
Chloride: 96 mmol/L — ABNORMAL LOW (ref 98–111)
Creatinine, Ser: 10.5 mg/dL — ABNORMAL HIGH (ref 0.44–1.00)
Glucose, Bld: 72 mg/dL (ref 70–99)
HCT: 39 % (ref 36.0–46.0)
Hemoglobin: 13.3 g/dL (ref 12.0–15.0)
Potassium: 5.8 mmol/L — ABNORMAL HIGH (ref 3.5–5.1)
Sodium: 137 mmol/L (ref 135–145)
TCO2: 31 mmol/L (ref 22–32)

## 2021-11-05 LAB — CBC WITH DIFFERENTIAL/PLATELET

## 2021-11-05 MED ORDER — NALOXONE HCL 0.4 MG/ML IJ SOLN
0.4000 mg | INTRAMUSCULAR | Status: DC | PRN
  Administered 2021-11-05 – 2021-11-06 (×2): 0.4 mg via INTRAVENOUS
  Filled 2021-11-05 (×2): qty 1

## 2021-11-05 MED ORDER — ONDANSETRON HCL 4 MG/2ML IJ SOLN
4.0000 mg | Freq: Once | INTRAMUSCULAR | Status: AC
  Administered 2021-11-05: 4 mg via INTRAVENOUS
  Filled 2021-11-05: qty 2

## 2021-11-05 NOTE — ED Provider Notes (Signed)
[facility] EMERGENCY DEPARTMENT Provider Note  CSN: [id number] Arrival date & time: 11/05/21 2248  Chief Complaint(s) Altered Mental Status  HPI [name] [name] is a 41 y.o. female with PMH ESRD on hemodialysis Monday Wednesday Friday, history of narcotic abuse, gastroparesis who presents emergency department for evaluation of altered mental status.  According to EMS, the patient had not been heard from for 2 days and they are unsure if patient has been compliant with dialysis.  Patient arrives with altered mental status and pinpoint pupils.  Additional history or review of systems unable to be obtained due to patient's altered mental status.  Patient hemodynamically stable upon arrival outside of high blood pressure.   Altered Mental Status  Past Medical History Past Medical History:  Diagnosis Date   Anemia    Anxiety    ESRD (end stage renal disease) ([place])    Henmodialysis   Gastroparesis    Hypertension    Narcotic abuse ([place])    Non compliance with medical treatment    Renal disorder 2015   transplant   Vision loss    Patient Active Problem List   Diagnosis Date Noted   Acute encephalopathy 11/06/2021   HTN (hypertension), malignant 08/02/2021   ESRD (end stage renal disease) ([place]) 02/25/2021   Coagulation defect, unspecified ([place]) 12/22/2020   Acute cough 10/04/2020   Allergy, unspecified, initial encounter 05/21/2020   Anaphylactic shock, unspecified, initial encounter 05/21/2020   Symptomatic anemia 02/27/2020   Shortness of breath 04/23/2019   Hypocalcemia 12/30/2018   Aortic atherosclerosis ([place]) 12/27/2018   Acute respiratory failure with hypoxia ([place]) 12/22/2018   Hyperkalemia    Bilateral pneumonia 12/21/2018   Pancreas transplant status ([place]) 12/21/2018   Immunosuppression ([place]) 12/21/2018   Chest pain 12/21/2018   CAP (community acquired pneumonia) 12/20/2018   Other staphylococcus as the cause of diseases classified elsewhere  08/16/2018   Mild protein-calorie malnutrition ([place]) 04/18/2018   Encounter for screening for respiratory tuberculosis 04/06/2018   Gout, unspecified 04/06/2018   Iron deficiency anemia, unspecified 04/06/2018   Leiomyoma of uterus, unspecified 04/06/2018   Secondary hyperparathyroidism of renal origin ([place]) 04/06/2018   Acute rejection of kidney transplant 07/11/2017   Elevated serum creatinine 06/27/2017   E. coli UTI 12/18/2014   Transplant rejection 09/03/2014   Cyst of ovary, left 09/01/2014   Immunosuppressive management encounter following kidney transplant 08/26/2014   Acne 04/28/2014   Itch of skin 04/28/2014   Diabetic gastroparesis associated with type 1 diabetes mellitus ([place]) 11/07/2013   Nausea & vomiting 11/07/2013   Lesion of spleen 22/97/9892   Metabolic acidosis 11/94/1740   Patient's noncompliance with other medical treatment and regimen 09/12/2013   Narcotic dependence ([place]) 08/31/2013   Generalized pain 08/30/2013   Hypomagnesemia 08/30/2013   Renal hematoma of right transplanted kidney 08/21/2013   Hypophosphatemia 07/28/2013   End stage renal disease ([place]) 02/06/2012   Pleural effusion, bilateral 11/16/2011   Anemia in chronic kidney disease (CKD) 11/16/2011   Pericardial effusion 11/15/2011   Fluid overload 11/15/2011   Abdominal pain, epigastric 11/15/2011   CKD (chronic kidney disease) stage 5, GFR less than 15 ml/min ([place]) 11/03/2011   Hypertension 10/27/2011   Renal failure, chronic 10/27/2011   MEDIAL EPICONDYLITIS, LEFT 12/17/2007   ABSCESS, AXILLA, LEFT 12/03/2007   Type 2 diabetes mellitus with other diabetic kidney complication ([place]) 81/44/8185   Home Medication(s) Prior to Admission medications   Medication Sig Start Date End Date Taking? Authorizing Provider  amLODipine (NORVASC) 10 MG tablet  Take 10 mg by mouth at bedtime. 09/19/18   [provider]  aspirin EC 81 MG tablet Take 81 mg by mouth daily.    [provider]   colchicine 0.6 MG tablet Take 0.6 mg by mouth 3 (three) times daily as needed (gout flares). 02/28/21   [provider]  cycloSPORINE modified (NEORAL) 100 MG capsule Take 200 mg by mouth 2 (two) times daily.    [provider]  cycloSPORINE modified (NEORAL) 25 MG capsule Take 25 mg by mouth every evening.    [provider]  diphenhydrAMINE (BENADRYL) 25 MG tablet Take 25 mg by mouth every 6 (six) hours as needed for itching.    [provider]  ferric citrate (AURYXIA) 1 GM 210 MG(Fe) tablet Take 420-630 mg by mouth See admin instructions. Take 630 mg with each meal and 420 mg with each snack    [provider]  furosemide (LASIX) 20 MG tablet Take 40 mg by mouth 4 (four) times a week. Take 40 mg on Tues, Thurs, Sat, and Sun (non-dialysis days) 09/12/19   [provider]  heparin 1000 unit/mL SOLN injection Heparin Sodium (Porcine) 1,000 Units/mL Catheter Lock Arterial 12/22/20 12/21/21  [provider]  HYDROcodone-acetaminophen (NORCO) 5-325 MG tablet Take 1 tablet by mouth every 6 (six) hours as needed for up to 12 doses for severe pain. Patient not taking: No sig reported 03/02/21   Corena Herter, PA-C  labetalol (NORMODYNE) 200 MG tablet Take 400 mg by mouth 2 (two) times daily. 09/12/19   [provider]  lidocaine-prilocaine (EMLA) cream Apply 1 application topically daily as needed (port access). 07/09/20   [provider]  losartan (COZAAR) 100 MG tablet Take 100 mg by mouth at bedtime. 02/02/21   [provider]  Methoxy PEG-Epoetin Beta (MIRCERA IJ) Mircera 01/03/21 01/02/22  [provider]  mycophenolate (MYFORTIC) 180 MG EC tablet Take 540 mg by mouth 2 (two) times daily.    [provider]  naloxone [facility]) nasal spray 4 mg/0.1 mL Place 1 spray into the nose as needed (opioid overdose).    [provider]  ondansetron (ZOFRAN) 4 MG tablet Take 4 mg by mouth 2 (two) times daily  as needed for nausea or vomiting.    [provider]  oxyCODONE-acetaminophen (PERCOCET/ROXICET) 5-325 MG tablet Take 1-2 tablets by mouth every 6 (six) hours as needed for severe pain. Patient taking differently: Take 1 tablet by mouth 2 (two) times daily. 03/22/21   [name], MD  predniSONE (DELTASONE) 5 MG tablet Take 15 mg by mouth daily. 06/24/15   [provider]  zolpidem (AMBIEN) 10 MG tablet Take 10 mg by mouth at bedtime.    [provider]  Past Surgical History Past Surgical History:  Procedure Laterality Date   A/V FISTULAGRAM Right 02/17/2021   Procedure: A/V FISTULAGRAM;  Surgeon: [name], MD;  Location: [place] [facility];  Service: Cardiovascular;  Laterality: Right;   AV FISTULA PLACEMENT  11/17/2011   Procedure: ARTERIOVENOUS (AV) FISTULA CREATION;  Surgeon: Rosetta Posner, MD;  Location: [place];  Service: Vascular;  Laterality: Right;   EYE SURGERY     HEMATOMA EVACUATION Right 02/25/2021   Procedure: EVACUATION HEMATOMA RIGHT CHEST;  Surgeon: Waynetta Sandy, MD;  Location: [place];  Service: Vascular;  Laterality: Right;   INSERTION OF DIALYSIS CATHETER Right 12/08/2020   Procedure: INSERTION OF TUNNELED DIALYSIS CATHETER;  Surgeon: [name], MD;  Location: [facility];  Service: Vascular;  Laterality: Right;   IR REMOVAL TUN CV CATH W/O FL  05/03/2021   KIDNEY TRANSPLANT     NEPHRECTOMY TRANSPLANTED ORGAN     pancrease transplant     PERIPHERAL VASCULAR BALLOON ANGIOPLASTY Right 02/17/2021   Procedure: PERIPHERAL VASCULAR BALLOON ANGIOPLASTY;  Surgeon: [name], MD;  Location: [place] [facility];  Service: Cardiovascular;  Laterality: Right;   REFRACTIVE SURGERY     REVISON OF ARTERIOVENOUS FISTULA Right 12/08/2020   Procedure: RIGHT ARM ARTERIOVENOUS FISTULA REVISION AND  RESECTION;  Surgeon: [name], MD;  Location: [place];  Service: Vascular;  Laterality: Right;   REVISON OF ARTERIOVENOUS FISTULA Right 02/25/2021   Procedure: RIGHT ARM ARTERIOVENOUS FISTULA REVISION WITH TRANSPOSITION OF CEPHALIC VEIN ON AXILLARY VEIN;  Surgeon: [name], MD;  Location: [place];  Service: Vascular;  Laterality: Right;   Family History Family History  Problem Relation Age of Onset   Hypertension Father     Social History Social History   Tobacco Use   Smoking status: Former    Types: Pipe   Smokeless tobacco: Never   Tobacco comments:    Hooka  Vaping Use   Vaping Use: Never used  Substance Use Topics   Alcohol use: No   Drug use: No   Allergies Iodinated contrast media  Review of Systems Review of Systems  Unable to perform ROS: Mental status change   Physical Exam Vital Signs  I have reviewed the triage vital signs BP (!) 203/92    Pulse 90    Temp 99.6 F (37.6 C) (Oral)    Resp 19    SpO2 100%   Physical Exam Vitals and nursing note reviewed.  Constitutional:      General: She is not in acute distress.    Appearance: She is well-developed. She is ill-appearing.  HENT:     Head: Normocephalic and atraumatic.  Eyes:     Conjunctiva/sclera: Conjunctivae normal.     Comments: Pinpoint pupils  Cardiovascular:     Rate and Rhythm: Normal rate and regular rhythm.     Heart sounds: No murmur heard. Pulmonary:     Effort: Pulmonary effort is normal. No respiratory distress.     Breath sounds: Normal breath sounds.  Abdominal:     Palpations: Abdomen is soft.     Tenderness: There is no abdominal tenderness.  Musculoskeletal:        General: No swelling.     Cervical back: Neck supple.  Skin:    General: Skin is warm and dry.     Capillary Refill: Capillary refill takes less than 2 seconds.  Neurological:     Mental Status: She is alert.  Psychiatric:  Mood and Affect: Mood normal.    ED Results and  Treatments Labs (all labs ordered are listed, but only abnormal results are displayed) Labs Reviewed  COMPREHENSIVE METABOLIC PANEL - Abnormal; Notable for the following components:      Result Value   Potassium 5.9 (*)    Chloride 91 (*)    BUN 65 (*)    Creatinine, Ser 9.65 (*)    Calcium 8.6 (*)    Albumin 3.4 (*)    GFR, Estimated 5 (*)    Anion gap 20 (*)    All other components within normal limits  CBC WITH DIFFERENTIAL/PLATELET - Abnormal; Notable for the following components:   WBC 3.1 (*)    Platelets 79 (*)    Lymphs Abs 0.6 (*)    nRBC 2 (*)    All other components within normal limits  I-STAT CHEM 8, ED - Abnormal; Notable for the following components:   Potassium 5.8 (*)    Chloride 96 (*)    BUN 62 (*)    Creatinine, Ser 10.50 (*)    Calcium, Ion 0.95 (*)    All other components within normal limits  CBG MONITORING, ED - Abnormal; Notable for the following components:   Glucose-Capillary 117 (*)    All other components within normal limits  TROPONIN I (HIGH SENSITIVITY) - Abnormal; Notable for the following components:   Troponin I (High Sensitivity) 76 (*)    All other components within normal limits  RESP PANEL BY RT-PCR (FLU A&B, COVID) ARPGX2  CBG MONITORING, ED  CBG MONITORING, ED  TROPONIN I (HIGH SENSITIVITY)                                                                                                                          Radiology CT Head Wo Contrast  Result Date: 11/06/2021 CLINICAL DATA:  Altered mental status EXAM: CT HEAD WITHOUT CONTRAST TECHNIQUE: Contiguous axial images were obtained from the base of the skull through the vertex without intravenous contrast. RADIATION DOSE REDUCTION: This exam was performed according to the departmental dose-optimization program which includes automated exposure control, adjustment of the mA and/or kV according to patient size and/or use of iterative reconstruction technique. COMPARISON:  11/04/2018  FINDINGS: Brain: No evidence of acute infarction, hemorrhage, hydrocephalus, extra-axial collection or mass lesion/mass effect. 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 Skull: Normal. Negative for fracture or focal lesion. Sinuses/Orbits: No acute finding. Other: None. IMPRESSION: No acute intracranial abnormality noted. Electronically Signed   By: Inez Catalina M.D.   On: 11/06/2021 01:57   DG Chest Portable 1 View  Result Date: 11/06/2021 CLINICAL DATA:  Difficulty breathing EXAM: PORTABLE CHEST 1 VIEW COMPARISON:  12/08/2020 FINDINGS: Cardiac shadow is enlarged but stable. Previously seen dialysis catheter has been removed in the interval. Generalize pulmonary edema is noted likely related to volume overload. No focal infiltrate or effusion is seen. No bony abnormality is  noted. IMPRESSION: Mild pulmonary edema likely related to volume overload. Electronically Signed   By: Inez Catalina M.D.   On: 11/06/2021 00:35    Pertinent labs & imaging results that were available during my care of the patient were reviewed by me and considered in my medical decision making (see MDM for details).  Medications Ordered in ED Medications  naloxone [facility]) injection 0.4 mg (0.4 mg Intravenous Given 11/05/21 2352)  ondansetron (ZOFRAN) injection 4 mg (4 mg Intravenous Given 11/05/21 2351)  lactated ringers bolus 1,000 mL (1,000 mLs Intravenous New Bag/Given 11/06/21 0109)  insulin aspart (novoLOG) injection 5 Units (5 Units Intravenous Given 11/06/21 0107)    And  dextrose 50 % solution 50 mL (50 mLs Intravenous Given 11/06/21 0108)  hydrALAZINE (APRESOLINE) injection 10 mg (10 mg Intravenous Given 11/06/21 0108)                                                                                                                                     Procedures .Critical Care Performed by: Teressa Lower, MD Authorized by: Teressa Lower, MD   Critical  care provider statement:    Critical care time (minutes):  30   Critical care was necessary to treat or prevent imminent or life-threatening deterioration of the following conditions:  Renal failure and respiratory failure   Critical care was time spent personally by me on the following activities:  Development of treatment plan with patient or surrogate, discussions with consultants, evaluation of patient's response to treatment, examination of patient, ordering and review of laboratory studies, ordering and review of radiographic studies, ordering and performing treatments and interventions, pulse oximetry, re-evaluation of patient's condition and review of old charts  (including critical care time)  Medical Decision Making / ED Course   This patient presents to the ED for concern of altered mental status, this involves an extensive number of treatment options, and is a complaint that carries with it a high risk of complications and morbidity.  The differential diagnosis includes opioid intoxication, polypharmacy, uremia, sepsis, intracranial bleed  MDM: Patient seen emergency department for evaluation of altered mental status.  Physical exam reveals an ill-appearing patient with pinpoint pupils but physical exam otherwise unremarkable.  Laboratory evaluation with leukopenia to 3.1, thrombocytopenia to 79, potassium 5.9, BUN 65, creatinine 9.65, high-sensitivity troponin elevated to 76 likely elevated in the setting of demand ischemia versus decreased renal clearance.  Low-dose IV Narcan 0.4 mg administered to the patient to which she had the expected dramatic response with diaphoresis nausea and vomiting but unfortunately did not have a significant permit in her mental status.  Pupillary exam did improve.  Hyperkalemia treated with dextrose and 5 units of insulin.  Hydralazine ordered for patient's blood pressure greater than 200.  CT head with no bleed.  Chest x-ray with pleural effusion versus  fluid overload.  Patient placed on 2 L nasal cannula due to intermittent hypoxia likely  secondary to this fluid overload.  Nephrology consulted who will dialyze the patient.  Patient then admitted to medicine   Additional history obtained: -Additional history obtained from boyfriend  -External records from outside source obtained and reviewed including: Chart review including previous notes, labs, imaging, consultation notes   Lab Tests: -I ordered, reviewed, and interpreted labs.   The pertinent results include:   Labs Reviewed  COMPREHENSIVE METABOLIC PANEL - Abnormal; Notable for the following components:      Result Value   Potassium 5.9 (*)    Chloride 91 (*)    BUN 65 (*)    Creatinine, Ser 9.65 (*)    Calcium 8.6 (*)    Albumin 3.4 (*)    GFR, Estimated 5 (*)    Anion gap 20 (*)    All other components within normal limits  CBC WITH DIFFERENTIAL/PLATELET - Abnormal; Notable for the following components:   WBC 3.1 (*)    Platelets 79 (*)    Lymphs Abs 0.6 (*)    nRBC 2 (*)    All other components within normal limits  I-STAT CHEM 8, ED - Abnormal; Notable for the following components:   Potassium 5.8 (*)    Chloride 96 (*)    BUN 62 (*)    Creatinine, Ser 10.50 (*)    Calcium, Ion 0.95 (*)    All other components within normal limits  CBG MONITORING, ED - Abnormal; Notable for the following components:   Glucose-Capillary 117 (*)    All other components within normal limits  TROPONIN I (HIGH SENSITIVITY) - Abnormal; Notable for the following components:   Troponin I (High Sensitivity) 76 (*)    All other components within normal limits  RESP PANEL BY RT-PCR (FLU A&B, COVID) ARPGX2  CBG MONITORING, ED  CBG MONITORING, ED  TROPONIN I (HIGH SENSITIVITY)      EKG \  EKG Interpretation  Date/Time:  Saturday November 05 2021 22:53:42 EST Ventricular Rate:  80 PR Interval:  134 QRS Duration: 75 QT Interval:  436 QTC Calculation: 503 R Axis:   41 Text  Interpretation: Sinus rhythm  hyperacute T waves Confirmed by [place], [place] ([id number]) on 11/05/2021 11:19:50 PM         Imaging Studies ordered: I ordered imaging studies including CTH, CXR I independently visualized and interpreted imaging. I agree with the radiologist interpretation   Medicines ordered and prescription drug management: Meds ordered this encounter  Medications   naloxone (NARCAN) injection 0.4 mg   ondansetron (ZOFRAN) injection 4 mg   lactated ringers bolus 1,000 mL   AND Linked Order Group    insulin aspart (novoLOG) injection 5 Units    dextrose 50 % solution 50 mL   hydrALAZINE (APRESOLINE) injection 10 mg    -I have reviewed the patients home medicines and have made adjustments as needed  Critical interventions Hyperkalemia management, Narcan administration  Consultations Obtained: I requested consultation with the nephrology,  and discussed lab and imaging findings as well as pertinent plan - they recommend: Dialysis   Cardiac Monitoring: The patient was maintained on a cardiac monitor.  I personally viewed and interpreted the cardiac monitored which showed an underlying rhythm of: Normal sinus rhythm  Social Determinants of Health:  Factors impacting patients care include: English second language   Reevaluation: After the interventions noted above, I reevaluated the patient and found that they have :improved  Co morbidities that complicate the patient evaluation  Past Medical History:  Diagnosis Date  Anemia    Anxiety    ESRD (end stage renal disease) ([facility])    Henmodialysis   Gastroparesis    Hypertension    Narcotic abuse ([place])    Non compliance with medical treatment    Renal disorder 2015   transplant   Vision loss       Dispostion: I considered admission for this patient, and due to uremia and altered mental status she will be admitted as well as fluid overload     Final Clinical Impression(s) / ED Diagnoses Final  diagnoses:  None     '@PCDICTATION'$ @    Teressa Lower, MD 11/06/21 [phone]

## 2021-11-05 NOTE — ED Triage Notes (Signed)
BIB [facility] from home. Friend was concerned because pt hasn't been seen for 2 days. EMS arrival pt is conscious but not oriented. Per EMS extremely altered mental status. Unable to answer questions and when she does also she will say is yes. Attempted to ask pt questions and she is unable to answer them. Pt is a dialysis pt with rt arm restriction, tx is Mon, Wed, Fri, unsure if she made it to her last appointment. Per EMS EKG shows ST and peaked T-waves, CBG 97 ?

## 2021-11-05 NOTE — ED Notes (Signed)
Provider at bedside

## 2021-11-06 ENCOUNTER — Inpatient Hospital Stay (HOSPITAL_COMMUNITY): Payer: Medicare Other

## 2021-11-06 ENCOUNTER — Encounter (HOSPITAL_COMMUNITY): Payer: Self-pay | Admitting: Family Medicine

## 2021-11-06 ENCOUNTER — Emergency Department (HOSPITAL_COMMUNITY): Payer: Medicare Other

## 2021-11-06 DIAGNOSIS — E872 Acidosis, unspecified: Secondary | ICD-10-CM | POA: Diagnosis present

## 2021-11-06 DIAGNOSIS — I160 Hypertensive urgency: Secondary | ICD-10-CM

## 2021-11-06 DIAGNOSIS — D689 Coagulation defect, unspecified: Secondary | ICD-10-CM | POA: Diagnosis present

## 2021-11-06 DIAGNOSIS — F1110 Opioid abuse, uncomplicated: Secondary | ICD-10-CM | POA: Diagnosis present

## 2021-11-06 DIAGNOSIS — Z992 Dependence on renal dialysis: Secondary | ICD-10-CM | POA: Diagnosis not present

## 2021-11-06 DIAGNOSIS — G9340 Encephalopathy, unspecified: Secondary | ICD-10-CM | POA: Diagnosis present

## 2021-11-06 DIAGNOSIS — T8612 Kidney transplant failure: Secondary | ICD-10-CM | POA: Diagnosis present

## 2021-11-06 DIAGNOSIS — T402X1A Poisoning by other opioids, accidental (unintentional), initial encounter: Secondary | ICD-10-CM | POA: Diagnosis present

## 2021-11-06 DIAGNOSIS — I3139 Other pericardial effusion (noninflammatory): Secondary | ICD-10-CM | POA: Diagnosis present

## 2021-11-06 DIAGNOSIS — Z940 Kidney transplant status: Secondary | ICD-10-CM | POA: Diagnosis not present

## 2021-11-06 DIAGNOSIS — I6200 Nontraumatic subdural hemorrhage, unspecified: Secondary | ICD-10-CM | POA: Diagnosis present

## 2021-11-06 DIAGNOSIS — E875 Hyperkalemia: Secondary | ICD-10-CM

## 2021-11-06 DIAGNOSIS — N186 End stage renal disease: Secondary | ICD-10-CM | POA: Diagnosis present

## 2021-11-06 DIAGNOSIS — E7220 Disorder of urea cycle metabolism, unspecified: Secondary | ICD-10-CM | POA: Diagnosis present

## 2021-11-06 DIAGNOSIS — I6389 Other cerebral infarction: Secondary | ICD-10-CM | POA: Diagnosis not present

## 2021-11-06 DIAGNOSIS — M1A9XX0 Chronic gout, unspecified, without tophus (tophi): Secondary | ICD-10-CM | POA: Diagnosis not present

## 2021-11-06 DIAGNOSIS — R4182 Altered mental status, unspecified: Secondary | ICD-10-CM | POA: Diagnosis present

## 2021-11-06 DIAGNOSIS — R17 Unspecified jaundice: Secondary | ICD-10-CM | POA: Diagnosis present

## 2021-11-06 DIAGNOSIS — R6520 Severe sepsis without septic shock: Secondary | ICD-10-CM | POA: Diagnosis present

## 2021-11-06 DIAGNOSIS — J1282 Pneumonia due to coronavirus disease 2019: Secondary | ICD-10-CM | POA: Diagnosis present

## 2021-11-06 DIAGNOSIS — G928 Other toxic encephalopathy: Secondary | ICD-10-CM | POA: Diagnosis present

## 2021-11-06 DIAGNOSIS — J810 Acute pulmonary edema: Secondary | ICD-10-CM

## 2021-11-06 DIAGNOSIS — D849 Immunodeficiency, unspecified: Secondary | ICD-10-CM | POA: Diagnosis not present

## 2021-11-06 DIAGNOSIS — E1142 Type 2 diabetes mellitus with diabetic polyneuropathy: Secondary | ICD-10-CM

## 2021-11-06 DIAGNOSIS — D6959 Other secondary thrombocytopenia: Secondary | ICD-10-CM | POA: Diagnosis present

## 2021-11-06 DIAGNOSIS — U071 COVID-19: Secondary | ICD-10-CM | POA: Diagnosis present

## 2021-11-06 DIAGNOSIS — R7989 Other specified abnormal findings of blood chemistry: Secondary | ICD-10-CM | POA: Diagnosis not present

## 2021-11-06 DIAGNOSIS — I120 Hypertensive chronic kidney disease with stage 5 chronic kidney disease or end stage renal disease: Secondary | ICD-10-CM | POA: Diagnosis present

## 2021-11-06 DIAGNOSIS — E1042 Type 1 diabetes mellitus with diabetic polyneuropathy: Secondary | ICD-10-CM | POA: Diagnosis present

## 2021-11-06 DIAGNOSIS — T50911A Poisoning by multiple unspecified drugs, medicaments and biological substances, accidental (unintentional), initial encounter: Secondary | ICD-10-CM | POA: Diagnosis present

## 2021-11-06 DIAGNOSIS — J811 Chronic pulmonary edema: Secondary | ICD-10-CM

## 2021-11-06 DIAGNOSIS — A4189 Other specified sepsis: Secondary | ICD-10-CM | POA: Diagnosis present

## 2021-11-06 DIAGNOSIS — D631 Anemia in chronic kidney disease: Secondary | ICD-10-CM | POA: Diagnosis present

## 2021-11-06 DIAGNOSIS — D84821 Immunodeficiency due to drugs: Secondary | ICD-10-CM | POA: Diagnosis present

## 2021-11-06 DIAGNOSIS — Y830 Surgical operation with transplant of whole organ as the cause of abnormal reaction of the patient, or of later complication, without mention of misadventure at the time of the procedure: Secondary | ICD-10-CM | POA: Diagnosis present

## 2021-11-06 DIAGNOSIS — E1022 Type 1 diabetes mellitus with diabetic chronic kidney disease: Secondary | ICD-10-CM | POA: Diagnosis present

## 2021-11-06 LAB — FERRITIN: Ferritin: 4185 ng/mL — ABNORMAL HIGH (ref 11–307)

## 2021-11-06 LAB — CBC WITH DIFFERENTIAL/PLATELET
Abs Immature Granulocytes: 0 10*3/uL (ref 0.00–0.07)
Basophils Absolute: 0 10*3/uL (ref 0.0–0.1)
Basophils Relative: 0 %
Eosinophils Absolute: 0 10*3/uL (ref 0.0–0.5)
Eosinophils Relative: 0 %
HCT: 38.4 % (ref 36.0–46.0)
Hemoglobin: 12.5 g/dL (ref 12.0–15.0)
Lymphocytes Relative: 20 %
Lymphs Abs: 0.6 10*3/uL — ABNORMAL LOW (ref 0.7–4.0)
MCH: 30.6 pg (ref 26.0–34.0)
MCHC: 32.6 g/dL (ref 30.0–36.0)
MCV: 94.1 fL (ref 80.0–100.0)
Monocytes Absolute: 0.2 10*3/uL (ref 0.1–1.0)
Monocytes Relative: 7 %
Neutro Abs: 2.3 10*3/uL (ref 1.7–7.7)
Neutrophils Relative %: 73 %
Platelets: 79 10*3/uL — ABNORMAL LOW (ref 150–400)
RBC: 4.08 MIL/uL (ref 3.87–5.11)
RDW: 13 % (ref 11.5–15.5)
WBC: 3.1 10*3/uL — ABNORMAL LOW (ref 4.0–10.5)
nRBC: 0 % (ref 0.0–0.2)
nRBC: 2 /100 WBC — ABNORMAL HIGH

## 2021-11-06 LAB — CBC
HCT: 36.5 % (ref 36.0–46.0)
Hemoglobin: 11.9 g/dL — ABNORMAL LOW (ref 12.0–15.0)
MCH: 30.5 pg (ref 26.0–34.0)
MCHC: 32.6 g/dL (ref 30.0–36.0)
MCV: 93.6 fL (ref 80.0–100.0)
Platelets: 75 10*3/uL — ABNORMAL LOW (ref 150–400)
RBC: 3.9 MIL/uL (ref 3.87–5.11)
RDW: 12.9 % (ref 11.5–15.5)
WBC: 3.4 10*3/uL — ABNORMAL LOW (ref 4.0–10.5)
nRBC: 0 % (ref 0.0–0.2)

## 2021-11-06 LAB — C-REACTIVE PROTEIN: CRP: <0.5 mg/dL (ref <1.0)

## 2021-11-06 LAB — HEMOGLOBIN A1C
Hgb A1c MFr Bld: 3.9 % — ABNORMAL LOW (ref 4.8–5.6)
Mean Plasma Glucose: 65.23 mg/dL

## 2021-11-06 LAB — COMPREHENSIVE METABOLIC PANEL
ALT: 15 U/L (ref 0–44)
AST: 27 U/L (ref 15–41)
Albumin: 3.4 g/dL — ABNORMAL LOW (ref 3.5–5.0)
Alkaline Phosphatase: 71 U/L (ref 38–126)
Anion gap: 20 — ABNORMAL HIGH (ref 5–15)
BUN: 65 mg/dL — ABNORMAL HIGH (ref 6–20)
CO2: 28 mmol/L (ref 22–32)
Calcium: 8.6 mg/dL — ABNORMAL LOW (ref 8.9–10.3)
Chloride: 91 mmol/L — ABNORMAL LOW (ref 98–111)
Creatinine, Ser: 9.65 mg/dL — ABNORMAL HIGH (ref 0.44–1.00)
GFR, Estimated: 5 mL/min — ABNORMAL LOW (ref >60)
Glucose, Bld: 79 mg/dL (ref 70–99)
Potassium: 5.9 mmol/L — ABNORMAL HIGH (ref 3.5–5.1)
Sodium: 139 mmol/L (ref 135–145)
Total Bilirubin: 0.8 mg/dL (ref 0.3–1.2)
Total Protein: 6.6 g/dL (ref 6.5–8.1)

## 2021-11-06 LAB — HIV ANTIBODY (ROUTINE TESTING W REFLEX): HIV Screen 4th Generation wRfx: NONREACTIVE

## 2021-11-06 LAB — GLUCOSE, CAPILLARY
Glucose-Capillary: 73 mg/dL (ref 70–99)
Glucose-Capillary: 82 mg/dL (ref 70–99)
Glucose-Capillary: 82 mg/dL (ref 70–99)
Glucose-Capillary: 99 mg/dL (ref 70–99)

## 2021-11-06 LAB — RESP PANEL BY RT-PCR (FLU A&B, COVID) ARPGX2
Influenza A by PCR: NEGATIVE
Influenza B by PCR: NEGATIVE
SARS Coronavirus 2 by RT PCR: POSITIVE — AB

## 2021-11-06 LAB — BASIC METABOLIC PANEL
Anion gap: 18 — ABNORMAL HIGH (ref 5–15)
BUN: 72 mg/dL — ABNORMAL HIGH (ref 6–20)
CO2: 27 mmol/L (ref 22–32)
Calcium: 8.3 mg/dL — ABNORMAL LOW (ref 8.9–10.3)
Chloride: 95 mmol/L — ABNORMAL LOW (ref 98–111)
Creatinine, Ser: 10.3 mg/dL — ABNORMAL HIGH (ref 0.44–1.00)
GFR, Estimated: 4 mL/min — ABNORMAL LOW (ref >60)
Glucose, Bld: 74 mg/dL (ref 70–99)
Potassium: 5.5 mmol/L — ABNORMAL HIGH (ref 3.5–5.1)
Sodium: 140 mmol/L (ref 135–145)

## 2021-11-06 LAB — TROPONIN I (HIGH SENSITIVITY)
Troponin I (High Sensitivity): 76 ng/L — ABNORMAL HIGH (ref <18)
Troponin I (High Sensitivity): 69 ng/L — ABNORMAL HIGH (ref <18)
Troponin I (High Sensitivity): 76 ng/L — ABNORMAL HIGH (ref <18)
Troponin I (High Sensitivity): 68 ng/L — ABNORMAL HIGH (ref <18)

## 2021-11-06 LAB — AMMONIA: Ammonia: 98 umol/L — ABNORMAL HIGH (ref 9–35)

## 2021-11-06 LAB — CBG MONITORING, ED
Glucose-Capillary: 75 mg/dL (ref 70–99)
Glucose-Capillary: 117 mg/dL — ABNORMAL HIGH (ref 70–99)

## 2021-11-06 LAB — D-DIMER, QUANTITATIVE: D-Dimer, Quant: 0.82 ug/mL-FEU — ABNORMAL HIGH (ref 0.00–0.50)

## 2021-11-06 LAB — PROCALCITONIN: Procalcitonin: 1.28 ng/mL

## 2021-11-06 LAB — LACTATE DEHYDROGENASE: LDH: 273 U/L — ABNORMAL HIGH (ref 98–192)

## 2021-11-06 LAB — TSH: TSH: 0.884 u[IU]/mL (ref 0.350–4.500)

## 2021-11-06 IMAGING — DX DG CHEST 1V PORT
1 series · 1 of 1 positions shown · non-contrast
Comparison: [DATE]

CLINICAL DATA: Difficulty breathing

EXAM:
PORTABLE CHEST 1 VIEW

[chest]
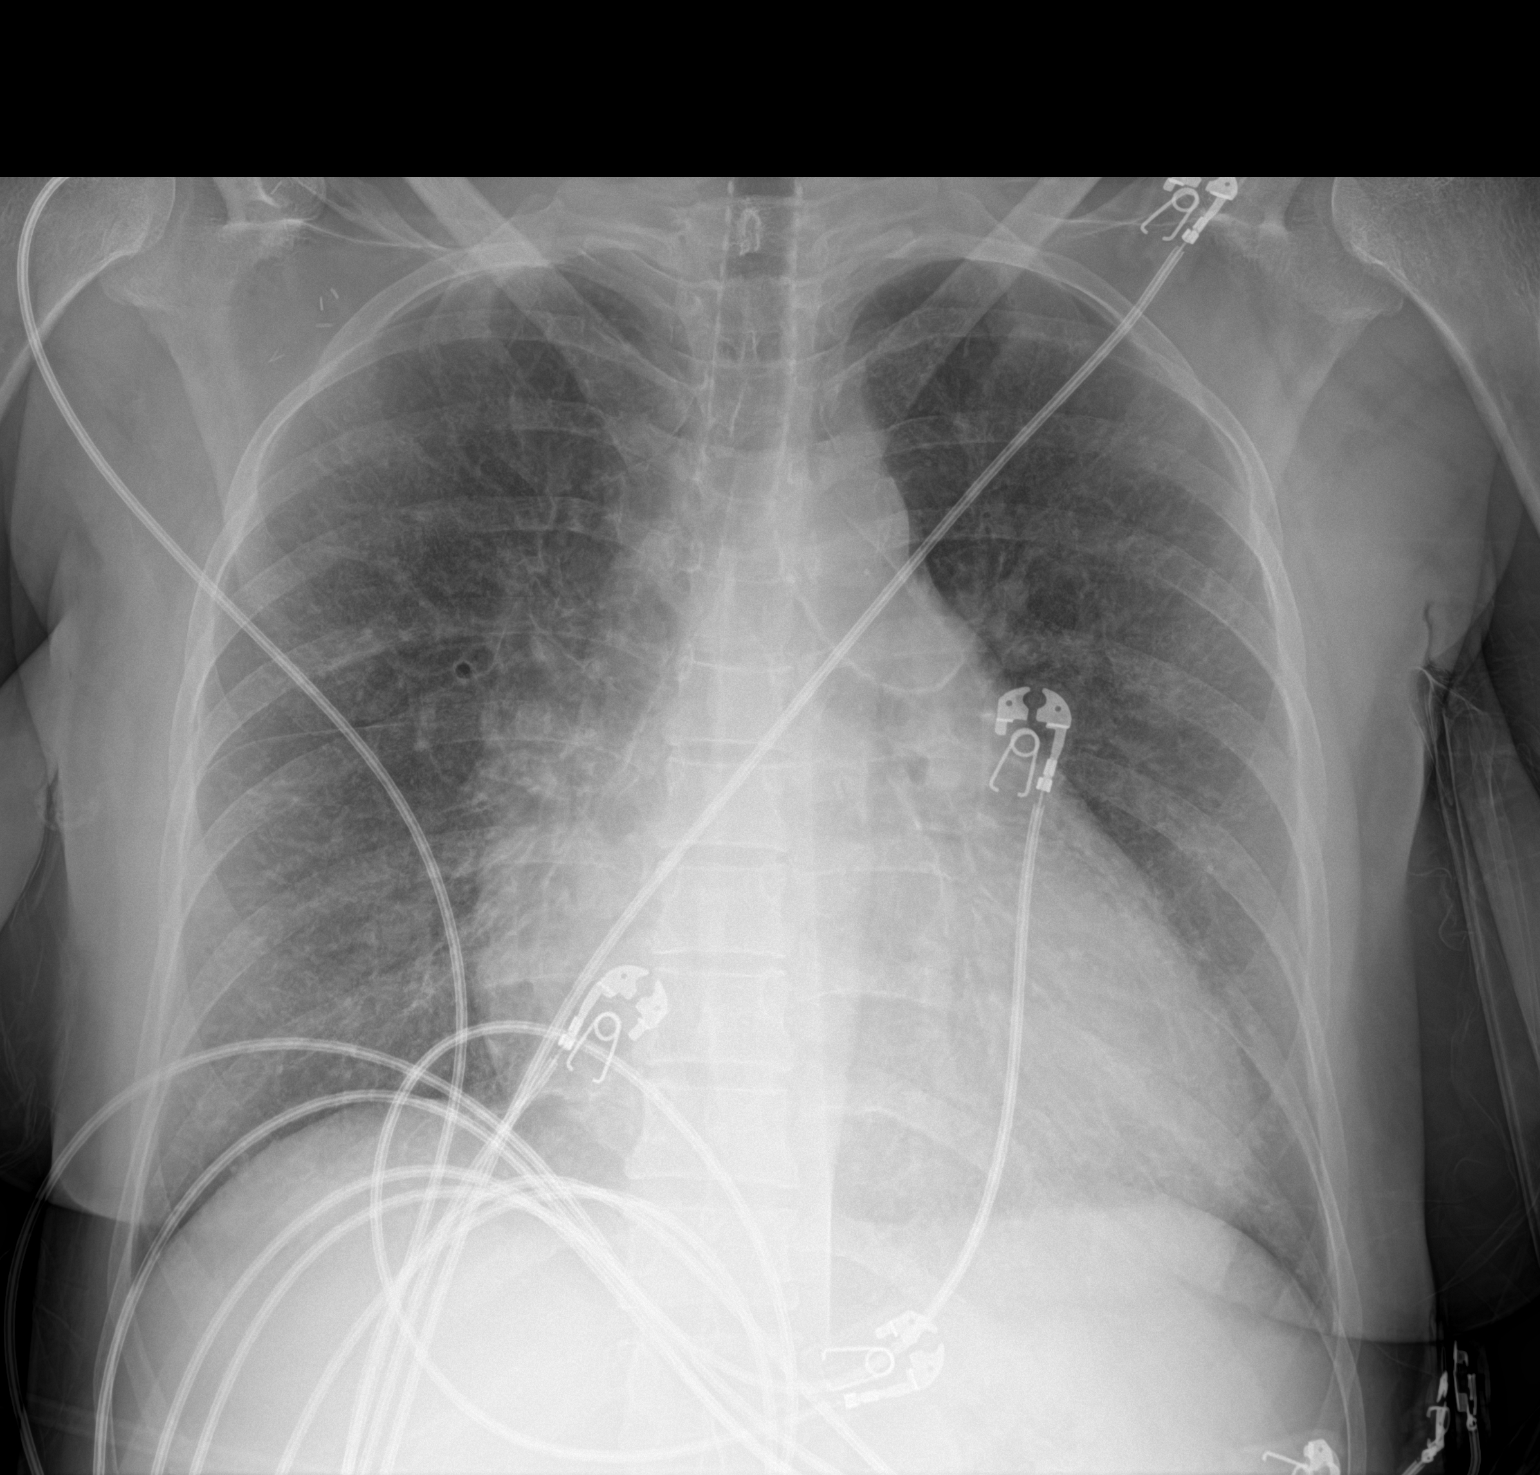

[1 of 1 positions shown; findings below may reference images not displayed]

FINDINGS: Cardiac shadow is enlarged but stable. Previously seen dialysis
catheter has been removed in the interval. Generalize pulmonary
edema is noted likely related to volume overload. No focal
infiltrate or effusion is seen. No bony abnormality is noted.
IMPRESSION: Mild pulmonary edema likely related to volume overload.

## 2021-11-06 MED ORDER — FUROSEMIDE 10 MG/ML IJ SOLN
60.0000 mg | Freq: Two times a day (BID) | INTRAMUSCULAR | Status: DC
  Administered 2021-11-06 – 2021-11-07 (×3): 60 mg via INTRAVENOUS
  Filled 2021-11-06 (×3): qty 6

## 2021-11-06 MED ORDER — ONDANSETRON HCL 4 MG PO TABS: 4.0000 mg | ORAL_TABLET | Freq: Two times a day (BID) | ORAL | Status: DC | PRN

## 2021-11-06 MED ORDER — AMLODIPINE BESYLATE 10 MG PO TABS
10.0000 mg | ORAL_TABLET | Freq: Every day | ORAL | Status: DC
  Filled 2021-11-06: qty 1

## 2021-11-06 MED ORDER — VITAMIN D 25 MCG (1000 UNIT) PO TABS
1000.0000 [IU] | ORAL_TABLET | Freq: Every day | ORAL | Status: DC
  Administered 2021-11-06 – 2021-11-17 (×12): 1000 [IU] via ORAL
  Filled 2021-11-06 (×14): qty 1

## 2021-11-06 MED ORDER — HYDRALAZINE HCL 20 MG/ML IJ SOLN
10.0000 mg | Freq: Four times a day (QID) | INTRAMUSCULAR | Status: DC | PRN
  Administered 2021-11-09 – 2021-11-10 (×2): 10 mg via INTRAVENOUS
  Filled 2021-11-06 (×2): qty 1

## 2021-11-06 MED ORDER — FUROSEMIDE 40 MG PO TABS
40.0000 mg | ORAL_TABLET | ORAL | Status: DC
Start: 2021-11-06 — End: 2021-11-06

## 2021-11-06 MED ORDER — SODIUM ZIRCONIUM CYCLOSILICATE 10 G PO PACK
10.0000 g | PACK | ORAL | Status: DC
  Administered 2021-11-06: 10 g via ORAL
  Filled 2021-11-06: qty 1

## 2021-11-06 MED ORDER — MAGNESIUM HYDROXIDE 400 MG/5ML PO SUSP: 30.0000 mL | Freq: Every day | ORAL | Status: DC | PRN

## 2021-11-06 MED ORDER — FERRIC CITRATE 1 GM 210 MG(FE) PO TABS
630.0000 mg | ORAL_TABLET | Freq: Three times a day (TID) | ORAL | Status: DC
Start: 2021-11-06 — End: 2021-11-07
  Administered 2021-11-07: 630 mg via ORAL
  Filled 2021-11-06 (×4): qty 3

## 2021-11-06 MED ORDER — ACETAMINOPHEN 325 MG PO TABS: 650.0000 mg | ORAL_TABLET | Freq: Four times a day (QID) | ORAL | Status: DC | PRN

## 2021-11-06 MED ORDER — ZINC SULFATE 220 (50 ZN) MG PO CAPS
220.0000 mg | ORAL_CAPSULE | Freq: Every day | ORAL | Status: DC
  Administered 2021-11-06: 220 mg via ORAL
  Filled 2021-11-06 (×2): qty 1

## 2021-11-06 MED ORDER — LABETALOL HCL 200 MG PO TABS
600.0000 mg | ORAL_TABLET | Freq: Two times a day (BID) | ORAL | Status: DC
  Administered 2021-11-06 (×2): 600 mg via ORAL
  Filled 2021-11-06 (×4): qty 3

## 2021-11-06 MED ORDER — HEPARIN SODIUM (PORCINE) 5000 UNIT/ML IJ SOLN: 5000.0000 [IU] | Freq: Three times a day (TID) | INTRAMUSCULAR | Status: DC

## 2021-11-06 MED ORDER — LABETALOL HCL 5 MG/ML IV SOLN: 20.0000 mg | INTRAVENOUS | Status: DC | PRN

## 2021-11-06 MED ORDER — GABAPENTIN 100 MG PO CAPS
100.0000 mg | ORAL_CAPSULE | Freq: Two times a day (BID) | ORAL | Status: DC
Start: 2021-11-06 — End: 2021-11-07
  Administered 2021-11-06: 100 mg via ORAL
  Filled 2021-11-06 (×3): qty 1

## 2021-11-06 MED ORDER — ASCORBIC ACID 500 MG PO TABS
500.0000 mg | ORAL_TABLET | Freq: Every day | ORAL | Status: DC
  Administered 2021-11-06: 500 mg via ORAL
  Filled 2021-11-06 (×2): qty 1

## 2021-11-06 MED ORDER — CHLORHEXIDINE GLUCONATE CLOTH 2 % EX PADS
6.0000 | MEDICATED_PAD | Freq: Every day | CUTANEOUS | Status: DC
  Administered 2021-11-07 – 2021-11-16 (×9): 6 via TOPICAL

## 2021-11-06 MED ORDER — TRAZODONE HCL 50 MG PO TABS: 25.0000 mg | ORAL_TABLET | Freq: Every evening | ORAL | Status: DC | PRN

## 2021-11-06 MED ORDER — DIPHENHYDRAMINE HCL 25 MG PO CAPS: 25.0000 mg | ORAL_CAPSULE | Freq: Four times a day (QID) | ORAL | Status: DC | PRN

## 2021-11-06 MED ORDER — ONDANSETRON HCL 4 MG PO TABS: 4.0000 mg | ORAL_TABLET | Freq: Four times a day (QID) | ORAL | Status: DC | PRN

## 2021-11-06 MED ORDER — PREDNISONE 10 MG PO TABS
15.0000 mg | ORAL_TABLET | Freq: Every day | ORAL | Status: DC
  Administered 2021-11-06: 15 mg via ORAL
  Filled 2021-11-06 (×2): qty 1

## 2021-11-06 MED ORDER — METOCLOPRAMIDE HCL 5 MG PO TABS: 5.0000 mg | ORAL_TABLET | Freq: Four times a day (QID) | ORAL | Status: DC | PRN

## 2021-11-06 MED ORDER — ACETAMINOPHEN 650 MG RE SUPP
650.0000 mg | Freq: Once | RECTAL | Status: AC
  Administered 2021-11-06: 650 mg via RECTAL
  Filled 2021-11-06: qty 1

## 2021-11-06 MED ORDER — ASPIRIN EC 81 MG PO TBEC
81.0000 mg | DELAYED_RELEASE_TABLET | Freq: Every day | ORAL | Status: DC
Start: 2021-11-06 — End: 2021-11-07
  Administered 2021-11-06: 81 mg via ORAL
  Filled 2021-11-06 (×2): qty 1

## 2021-11-06 MED ORDER — DEXTROSE 50 % IV SOLN
1.0000 | Freq: Once | INTRAVENOUS | Status: AC
  Administered 2021-11-06: 50 mL via INTRAVENOUS
  Filled 2021-11-06: qty 50

## 2021-11-06 MED ORDER — NALOXONE HCL 4 MG/0.1ML NA LIQD: 1.0000 | NASAL | Status: DC | PRN

## 2021-11-06 MED ORDER — CYCLOSPORINE MODIFIED (NEORAL) 100 MG PO CAPS
200.0000 mg | ORAL_CAPSULE | Freq: Two times a day (BID) | ORAL | Status: DC
  Filled 2021-11-06 (×3): qty 2

## 2021-11-06 MED ORDER — LACTATED RINGERS IV BOLUS
1000.0000 mL | Freq: Once | INTRAVENOUS | Status: AC
Start: 2021-11-06 — End: 2021-11-06
  Administered 2021-11-06: 1000 mL via INTRAVENOUS

## 2021-11-06 MED ORDER — INSULIN ASPART 100 UNIT/ML IJ SOLN: 0.0000 [IU] | Freq: Three times a day (TID) | INTRAMUSCULAR | Status: DC

## 2021-11-06 MED ORDER — INSULIN ASPART 100 UNIT/ML IV SOLN
5.0000 [IU] | Freq: Once | INTRAVENOUS | Status: AC
  Administered 2021-11-06: 5 [IU] via INTRAVENOUS

## 2021-11-06 MED ORDER — MOLNUPIRAVIR EUA 200MG CAPSULE
4.0000 | ORAL_CAPSULE | Freq: Two times a day (BID) | ORAL | Status: AC
  Administered 2021-11-06 – 2021-11-10 (×8): 800 mg via ORAL
  Filled 2021-11-06: qty 4

## 2021-11-06 MED ORDER — HYDRALAZINE HCL 20 MG/ML IJ SOLN
10.0000 mg | Freq: Once | INTRAMUSCULAR | Status: AC
  Administered 2021-11-06: 10 mg via INTRAVENOUS
  Filled 2021-11-06: qty 1

## 2021-11-06 MED ORDER — ACETAMINOPHEN 650 MG RE SUPP
650.0000 mg | Freq: Four times a day (QID) | RECTAL | Status: DC | PRN
  Administered 2021-11-06 – 2021-11-07 (×2): 650 mg via RECTAL
  Filled 2021-11-06 (×3): qty 1

## 2021-11-06 MED ORDER — CYCLOSPORINE MODIFIED (NEORAL) 25 MG PO CAPS
25.0000 mg | ORAL_CAPSULE | Freq: Every evening | ORAL | Status: DC
Start: 2021-11-06 — End: 2021-11-07
  Filled 2021-11-06: qty 1

## 2021-11-06 MED ORDER — MYCOPHENOLATE SODIUM 180 MG PO TBEC
540.0000 mg | DELAYED_RELEASE_TABLET | Freq: Two times a day (BID) | ORAL | Status: DC
  Administered 2021-11-06: 540 mg via ORAL
  Filled 2021-11-06 (×3): qty 3

## 2021-11-06 MED ORDER — CLONIDINE HCL 0.1 MG/24HR TD PTWK
0.1000 mg | MEDICATED_PATCH | TRANSDERMAL | Status: DC
  Administered 2021-11-06: 0.1 mg via TRANSDERMAL
  Filled 2021-11-06: qty 1

## 2021-11-06 MED ORDER — COLCHICINE 0.6 MG PO TABS
0.6000 mg | ORAL_TABLET | Freq: Three times a day (TID) | ORAL | Status: DC | PRN
  Filled 2021-11-06: qty 1

## 2021-11-06 MED ORDER — ONDANSETRON HCL 4 MG/2ML IJ SOLN: 4.0000 mg | Freq: Four times a day (QID) | INTRAMUSCULAR | Status: DC | PRN

## 2021-11-06 NOTE — ED Notes (Signed)
Provider at bedside. Pt diaphoretic and multiple episodes of vomiting ?

## 2021-11-06 NOTE — Hospital Course (Signed)
Patient is a 41 year old unfortunate female history of end-stage renal disease on HD on TTS, status post renal and pancreatic transplant, complicated by medical nonadherence and rejection, hypertension, gastroparesis, anxiety, anemia and narcotic abuse presented to the ED with acute onset of altered mental status and confusion.  It was noted that 1 of patient's friends had not heard from her for the last couple of days and EMS called.  When EMS got there patient noted to be conscious but disoriented.  Unknown as to whether patient had a last hemodialysis session.  Patient seen in the ED noted to have pinpoint pupils and altered mental status noted to be somnolent and arousable.  Patient given a dose of IV Narcan which significantly improved her mental status briefly.  Patient seen in the ED COVID-19 PCR obtained was positive.  Influenza PCR negative.  EKG showed normal sinus rhythm and some peaked T waves anterior laterally.  Initial high-sensitivity troponin was 76.  Chest x-ray showed mild pulmonary edema likely secondary to volume overload.  Noncontrast head CT negative ?

## 2021-11-06 NOTE — Progress Notes (Addendum)
PROGRESS NOTE    [name] [name]  PJK:[id number] DOB: 1980-09-12 DOA: 11/05/2021 PCP: Patient, No Pcp Per (Inactive)    Chief Complaint  Patient presents with   Altered Mental Status    Brief Narrative:  Patient is a 41 year old unfortunate female history of end-stage renal disease on HD on TTS, status post renal and pancreatic transplant, complicated by medical nonadherence and rejection, hypertension, gastroparesis, anxiety, anemia and narcotic abuse presented to the ED with acute onset of altered mental status and confusion.  It was noted that 1 of patient's friends had not heard from her for the last couple of days and EMS called.  When EMS got there patient noted to be conscious but disoriented.  Unknown as to whether patient had a last hemodialysis session.  Patient seen in the ED noted to have pinpoint pupils and altered mental status noted to be somnolent and arousable.  Patient given a dose of IV Narcan which significantly improved her mental status briefly.  Patient seen in the ED COVID-19 PCR obtained was positive.  Influenza PCR negative.  EKG showed normal sinus rhythm and some peaked T waves anterior laterally.  Initial high-sensitivity troponin was 76.  Chest x-ray showed mild pulmonary edema likely secondary to volume overload.  Noncontrast head CT negative    Assessment & Plan:  Principal Problem:   Acute encephalopathy Active Problems:   Pulmonary edema   Hypertensive urgency   Type 2 diabetes mellitus with peripheral neuropathy (HCC)   Gout   History of renal transplant   Hyperkalemia   COVID-19 virus infection    Assessment and Plan: * Acute encephalopathy - This is likely multifactorial.  It could be related to COVID encephalopathy as well as uremia with end-stage renal disease on hemodialysis with question about compliance and certainly polypharmacy with opiate overdose, given response to IV Narcan.. -  Patient opens eyes to verbal stimuli but drowsy and drifts  back off to sleep with some somnolence. -CT head negative for any acute abnormalities. -Continue neurochecks for another 24 hours. -Continue IV Narcan as needed we will give a dose now. -Avoid sedatives -Continue treatment for COVID of molnupiravir, vitamin C, zinc, vitamin D. -Monitor inflammatory markers. -TSH within normal limits. -Check an ammonia level -Nephrology consulted to assess for need for urgent hemodialysis. -Supportive care.  Pulmonary edema - Pulmonary edema noted on chest x-ray likely secondary to fluid overload secondary to end-stage renal disease with probable noncompliance with hemodialysis.   -On IV Lasix. -Unsure as to whether patient makes urine. -Nephrology consulted for hemodialysis.  Hypertensive urgency - Continue Norvasc, clonidine patch, labetalol. -Cozaar on hold secondary to hyperkalemia. -Place on IV hydralazine as needed.  Type 2 diabetes mellitus with peripheral neuropathy (HCC) - Hemoglobin A1c 4.6 (12/20/2018 ) -Repeat hemoglobin A1c.   -Continue Neurontin. -Place on SSI.  Gout - Colchicine as needed.  History of renal transplant -She is status post renal and pancreatic transplant with antirejection medication noncompliance. - She is currently being evaluated for renal transplant. - Continue prednisone and cyclosporine.   -Nephrology consult.   COVID-19 virus infection - Patient noted to be positive for COVID-19 infection -Follow inflammatory markers. -Patient chronically on prednisone. -Continue molnupiravir. -Supportive care.    Hyperkalemia - This was managed with D50 insulin and will be followed. - On Lokelma.   -May need HD.   -Nephrology consulted.           DVT prophylaxis: SCDs Code Status: Full Family Communication: No family at bedside. Disposition: TBD  Status is: Inpatient Remains inpatient appropriate because: Severity of illness           Consultants:  Nephrology: Dr. Joelyn Oms  Procedures:   CT head 11/06/2021 Chest x-ray 11/06/2021  Antimicrobials:  None   Subjective: Patient sleeping arousable but rates back up to sleep.  Alert to self only.  Still pretty confused/obtunded.  Answers yes to any shortness of breath, any chest pain, any abdominal pain.  Objective: Vitals:   11/07/21 0620 11/07/21 0630 11/07/21 0640 11/07/21 0700  BP:  (!) 86/62  (!) 160/79  Pulse:  (!) 104 100 (!) 107  Resp:  (!) 30 (!) 37 (!) 25  Temp:    (!) 101.2 F (38.4 C)  TempSrc:    Axillary  SpO2: 97% 96% 99% 91%  Weight:    53.4 kg  Height:        Intake/Output Summary (Last 24 hours) at 11/07/2021 0737 Last data filed at 11/07/2021 0700 Gross per 24 hour  Intake 0 ml  Output 3161 ml  Net -3161 ml   Filed Weights   11/06/21 0221 11/07/21 0300 11/07/21 0700  Weight: 61.2 kg 56.5 kg 53.4 kg    Examination:  General exam: Sleeping.  Drowsy/lethargic. Respiratory system: Diffuse crackles noted.  No wheezing.  No rhonchi.  Fair air movement.  Cardiovascular system: S1 & S2 heard, RRR. No JVD, murmurs, rubs, gallops or clicks. No pedal edema. Gastrointestinal system: Abdomen is nondistended, soft and nontender. No organomegaly or masses felt. Normal bowel sounds heard. Central nervous system: Alert and oriented. No focal neurological deficits. Extremities: Symmetric 5 x 5 power. Skin: No rashes, lesions or ulcers Psychiatry: Judgement and insight appear poor. Mood & affect appropriate.     Data Reviewed:   CBC: Recent Labs  Lab 11/05/21 2317 11/05/21 2322 11/06/21 0441 11/07/21 0137  WBC 3.1*  --  3.4* 4.2  NEUTROABS 2.3  --   --  3.4  HGB 12.5 13.3 11.9* 11.8*  HCT 38.4 39.0 36.5 36.3  MCV 94.1  --  93.6 93.8  PLT 79*  --  75* 64*    Basic Metabolic Panel: Recent Labs  Lab 11/05/21 2317 11/05/21 2322 11/06/21 0441 11/07/21 0137  NA 139 137 140 138  K 5.9* 5.8* 5.5* >7.5*  CL 91* 96* 95* 96*  CO2 28  --  27 22  GLUCOSE 79 72 74 75  BUN 65* 62* 72* 100*   CREATININE 9.65* 10.50* 10.30* 11.97*  CALCIUM 8.6*  --  8.3* 7.5*  PHOS  --   --   --  8.7*    GFR: Estimated Creatinine Clearance: 5.3 mL/min (A) (by C-G formula based on SCr of 11.97 mg/dL (H)).  Liver Function Tests: Recent Labs  Lab 11/05/21 2317 11/07/21 0137  AST 27  --   ALT 15  --   ALKPHOS 71  --   BILITOT 0.8  --   PROT 6.6  --   ALBUMIN 3.4* 3.1*    CBG: Recent Labs  Lab 11/06/21 0151 11/06/21 1321 11/06/21 1741 11/06/21 2029 11/06/21 2340  GLUCAP 117* 99 82 73 82     Recent Results (from the past 240 hour(s))  Resp Panel by RT-PCR (Flu A&B, Covid) Nasopharyngeal Swab     Status: Abnormal   Collection Time: 11/05/21 11:23 PM   Specimen: Nasopharyngeal Swab; Nasopharyngeal(NP) swabs in vial transport medium  Result Value Ref Range Status   SARS Coronavirus 2 by RT PCR POSITIVE (A) NEGATIVE Final    Comment: (  NOTE) SARS-CoV-2 target nucleic acids are DETECTED.  The SARS-CoV-2 RNA is generally detectable in upper respiratory specimens during the acute phase of infection. Positive results are indicative of the presence of the identified virus, but do not rule out bacterial infection or co-infection with other pathogens not detected by the test. Clinical correlation with patient history and other diagnostic information is necessary to determine patient infection status. The expected result is Negative.  Fact Sheet for Patients: EntrepreneurPulse.com.au  Fact Sheet for Healthcare Providers: IncredibleEmployment.be  This test is not yet approved or cleared by the [place] FDA and  has been authorized for detection and/or diagnosis of SARS-CoV-2 by FDA under an Emergency Use Authorization (EUA).  This EUA will remain in effect (meaning this test can be used) for the duration of  the COVID-19 declaration under Section 564(b)(1) of the A ct, 21 U.S.C. section 360bbb-3(b)(1), unless the authorization  is terminated or revoked sooner.     Influenza A by PCR NEGATIVE NEGATIVE Final   Influenza B by PCR NEGATIVE NEGATIVE Final    Comment: (NOTE) The Xpert Xpress SARS-CoV-2/FLU/RSV plus assay is intended as an aid in the diagnosis of influenza from Nasopharyngeal swab specimens and should not be used as a sole basis for treatment. Nasal washings and aspirates are unacceptable for Xpert Xpress SARS-CoV-2/FLU/RSV testing.  Fact Sheet for Patients: EntrepreneurPulse.com.au  Fact Sheet for Healthcare Providers: IncredibleEmployment.be  This test is not yet approved or cleared by the [place] FDA and has been authorized for detection and/or diagnosis of SARS-CoV-2 by FDA under an Emergency Use Authorization (EUA). This EUA will remain in effect (meaning this test can be used) for the duration of the COVID-19 declaration under Section 564(b)(1) of the Act, 21 U.S.C. section 360bbb-3(b)(1), unless the authorization is terminated or revoked.  Performed at [facility], [place] [address] [place], [place] [address]          Radiology Studies: CT Head Wo Contrast  Result Date: 11/06/2021 CLINICAL DATA:  Altered mental status EXAM: CT HEAD WITHOUT CONTRAST TECHNIQUE: Contiguous axial images were obtained from the base of the skull through the vertex without intravenous contrast. RADIATION DOSE REDUCTION: This exam was performed according to the departmental dose-optimization program which includes automated exposure control, adjustment of the mA and/or kV according to patient size and/or use of iterative reconstruction technique. COMPARISON:  11/04/2018 FINDINGS: Brain: No evidence of acute infarction, hemorrhage, hydrocephalus, extra-axial collection or mass lesion/mass effect. 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 Skull: Normal. Negative for fracture or focal lesion.  Sinuses/Orbits: No acute finding. Other: None. IMPRESSION: No acute intracranial abnormality noted. Electronically Signed   By: Inez Catalina M.D.   On: 11/06/2021 01:57   DG Chest Portable 1 View  Result Date: 11/06/2021 CLINICAL DATA:  Difficulty breathing EXAM: PORTABLE CHEST 1 VIEW COMPARISON:  12/08/2020 FINDINGS: Cardiac shadow is enlarged but stable. Previously seen dialysis catheter has been removed in the interval. Generalize pulmonary edema is noted likely related to volume overload. No focal infiltrate or effusion is seen. No bony abnormality is noted. IMPRESSION: Mild pulmonary edema likely related to volume overload. Electronically Signed   By: Inez Catalina M.D.   On: 11/06/2021 00:35        Scheduled Meds:  amLODipine  10 mg Oral QHS   vitamin C  500 mg Oral Daily   aspirin EC  81 mg Oral Daily   Chlorhexidine Gluconate Cloth  6  each Topical Daily   cholecalciferol  1,000 Units Oral Daily   cloNIDine  0.1 mg Transdermal Q Sun   cycloSPORINE modified  200 mg Oral BID   cycloSPORINE modified  25 mg Oral QPM   ferric citrate  630 mg Oral TID WC   furosemide  60 mg Intravenous BID   gabapentin  100 mg Oral BID   insulin aspart  0-6 Units Subcutaneous TID WC   labetalol  600 mg Oral BID   molnupiravir EUA  4 capsule Oral BID   mycophenolate  540 mg Oral BID   predniSONE  15 mg Oral Daily   sodium zirconium cyclosilicate  10 g Oral Once per day on Sun Tue Thu Sat   zinc sulfate  220 mg Oral Daily   Continuous Infusions:  sodium chloride     sodium chloride       LOS: 1 day    Time spent: 40 minutes    Irine Seal, MD [name]   To contact the attending provider between 7A-7P or the covering provider during after hours 7P-7A, please log into the web site [email or web address] and access using universal [place] password for that web site. If you do not have the password, please call the hospital operator.  11/07/2021, 7:37 AM

## 2021-11-06 NOTE — ED Notes (Signed)
Admit provider at bedside 

## 2021-11-06 NOTE — Assessment & Plan Note (Addendum)
-   Pulmonary edema noted on chest x-ray likely secondary to fluid overload secondary to end-stage renal disease with probable noncompliance with hemodialysis.   ?-Was on IV Lasix which we will discontinue as patient underwent hemodialysis earlier this morning with clear lungs on examination.   ?-Nephrology following. ?

## 2021-11-06 NOTE — Progress Notes (Signed)
11/06/2021  ?0320 ?Received pt to room 4E-14 from ED with DX Acute Encephalopathy.  Pt is Alert but can not tell me anything, only says "yes" and "I got ya."   Tele monitor applied and CCMD notified.  CHG bath given.  Oriented to room, call light and bed.  Call bell in reach.  Bed alarm on. ?Brion Aliment C ? ? ? ?

## 2021-11-06 NOTE — ED Notes (Signed)
Pt transported to radiology at this time °

## 2021-11-06 NOTE — Consult Note (Addendum)
[name] [name] [id number] Admit Date: 11/05/2021 11/06/2021 Rexene Agent Requesting Physician:  Grandville Silos MD  Reason for Consult:  ESRD Co Management HPI:  29F ESRD MWF [place] AVF admitted overnight after presenting with encephalopathy.  Past history includes failed kidney/pancreas transplant, hypertension, gastroparesis.  In the ED there was concern for narcotic overdose and did have some response to Narcan.  She also was positive for COVID-19.  CT of the head was negative.  Chest x-ray with mild pulmonary edema.  Patient admitted, placed on molnupiravir; chronic immunosuppression with cyclosporine and prednisone continued.  Patient is currently groggy, answers some basic questions but quickly falls back asleep.  She is requiring 2 L nasal cannula to maintain oxygenation.  Labs are notable for potassium of 5.5, BUN 72.  Last HD 3/3, leaving 1 kg above her EDW.  Outpt HD Orders Unit: [facility] Days: MWF Time: 3h 24mn Dialyzer: F180 EDW: 59kg K/Ca: 2/2; of note use of Lokelma Access: RUE AVF Needle Size: 15g BFR/DFR: 400/500 UF Proflie: none VDRA: hectorol 622m qTx EPO: none Heparin: IVB heparin 2k qTx  ROS Unable to complete review of systems secondary to patient's encephalopathy  PMH  Past Medical History:  Diagnosis Date   Anemia    Anxiety    ESRD (end stage renal disease) (HCC)    Henmodialysis   Gastroparesis    Hypertension    [place]otic [place]   Non compliance with medical treatment    Renal disorder 2015   transplant   Vision loss    PSH  Past Surgical History:  Procedure Laterality Date   A/V FISTULAGRAM Right 02/17/2021   Procedure: A/V FISTULAGRAM;  Surgeon: ClMarty HeckMD;  [facility];  Service: Cardiovascular;  Laterality: Right;   AV FISTULA PLACEMENT  11/17/2011   Procedure: ARTERIOVENOUS (AV) FISTULA CREATION;  Surgeon: ToRosetta PosnerMD;  [facility] Service: Vascular;  Laterality: Right;   EYE SURGERY      HEMATOMA EVACUATION Right 02/25/2021   Procedure: EVACUATION HEMATOMA RIGHT CHEST;  Surgeon: CaWaynetta SandyMD;  [facility];  Laterality: Right;   INSERTION OF DIALYSIS CATHETER Right 12/08/2020   Procedure: INSERTION OF TUNNELED DIALYSIS CATHETER;  Surgeon: ClMarty HeckMD;  Location: [facility];  Service: Vascular;  Laterality: Right;   IR REMOVAL TUN CV CATH W/O FL  05/03/2021   KIDNEY TRANSPLANT     NEPHRECTOMY TRANSPLANTED ORGAN     pancrease transplant     PERIPHERAL VASCULAR BALLOON ANGIOPLASTY Right 02/17/2021   Procedure: PERIPHERAL VASCULAR BALLOON ANGIOPLASTY;  Surgeon: ClMarty HeckMD;  [facility];  Service: Cardiovascular;  Laterality: Right;   REFRACTIVE SURGERY     REVISON OF ARTERIOVENOUS FISTULA Right 12/08/2020   Procedure: RIGHT ARM ARTERIOVENOUS FISTULA REVISION AND RESECTION;  Surgeon: ClMarty HeckMD;  [facility]: V[facility];  Laterality: Right;   REVISON OF ARTERIOVENOUS FISTULA Right 02/25/2021   Procedure: RIGHT ARM ARTERIOVENOUS FISTULA REVISION WITH TRANSPOSITION OF CEPHALIC VEIN ON AXILLARY VEIN;  Surgeon: ClMarty HeckMD;  Location[facility];  Service: Vascular;  Laterality: Right;   FH  Family History  Problem Relation Age of Onset   Hypertension Father    SH  reports that she has quit smoking. Her smoking use included pipe. She has never used smokeless tobacco. She reports that she does not drink alcohol and does not use drugs. Allergies  Allergies  Allergen Reactions   Iodinated Contrast Media     Other reaction(s): Unknown   Home medications Prior to Admission medications   Medication Sig Start Date End Date Taking? Authorizing Provider  amLODipine (NORVASC) 10 MG tablet Take 10 mg by mouth at bedtime. 09/19/18  Yes [provider]  CATAPRES-TTS-1 0.1 MG/24HR patch Place 0.1 mg onto the skin once a week. 09/26/21  Yes [provider]  ferric citrate  (AURYXIA) 1 GM 210 MG(Fe) tablet Take 630 mg by mouth 3 (three) times daily with meals.   Yes [provider]  furosemide (LASIX) 20 MG tablet Take 40 mg by mouth 4 (four) times a week. Take 40 mg on Tues, Thurs, Sat, and Sun (non-dialysis days) 09/12/19  Yes [provider]  gabapentin (NEURONTIN) 100 MG capsule Take 100 mg by mouth 2 (two) times daily. 10/12/21  Yes [provider]  labetalol (NORMODYNE) 200 MG tablet Take 600 mg by mouth 2 (two) times daily. 09/12/19  Yes [provider]  LOKELMA 10 g PACK packet Take 10 g by mouth See admin instructions. Non dialysis days [place]) 10/13/21  Yes [provider]  losartan (COZAAR) 100 MG tablet Take 100 mg by mouth at bedtime. 02/02/21  Yes [provider]  metoCLOPramide (REGLAN) 5 MG tablet Take 5 mg by mouth every 6 (six) hours as needed for nausea or vomiting. 09/03/21  Yes [provider]  molnupiravir EUA (LAGEVRIO) 200 mg CAPS capsule Take 4 capsules by mouth 2 (two) times daily.   Yes [provider]  mycophenolate (MYFORTIC) 180 MG EC tablet Take 540 mg by mouth 2 (two) times daily.   Yes [provider]  oxyCODONE-acetaminophen (PERCOCET/ROXICET) 5-325 MG tablet Take 1-2 tablets by mouth every 6 (six) hours as needed for severe pain. Patient taking differently: Take 1 tablet by mouth 3 (three) times daily as needed for moderate pain. 03/22/21  Yes [name], MD  predniSONE (DELTASONE) 5 MG tablet Take 15 mg by mouth daily. 06/24/15  Yes [provider]  zolpidem (AMBIEN) 10 MG tablet Take 10 mg by mouth at bedtime as needed for sleep.   Yes [provider]  aspirin EC 81 MG tablet Take 81 mg by mouth daily.    [provider]  colchicine 0.6 MG tablet Take 0.6 mg by mouth 3 (three) times daily as needed (gout flares). 02/28/21   [provider]  cycloSPORINE modified (NEORAL) 100 MG capsule Take 200  mg by mouth 2 (two) times daily.    [provider]  cycloSPORINE modified (NEORAL) 25 MG capsule Take 25 mg by mouth every evening.    [provider]  diphenhydrAMINE (BENADRYL) 25 MG tablet Take 25 mg by mouth every 6 (six) hours as needed for itching.    [provider]  heparin 1000 unit/mL SOLN injection Heparin Sodium (Porcine) 1,000 Units/mL Catheter Lock Arterial 12/22/20 12/21/21  [provider]  HYDROcodone-acetaminophen (NORCO) 5-325 MG tablet Take 1 tablet by mouth every 6 (six) hours as needed for up to 12 doses for severe pain. Patient not taking: No sig reported 03/02/21   Corena Herter, PA-C  lidocaine-prilocaine (EMLA) cream Apply 1 application topically daily as needed (port access). 07/09/20   [provider]  Methoxy PEG-Epoetin Beta (MIRCERA IJ) Mircera 01/03/21 01/02/22  [provider]  naloxone [facility]) nasal spray 4 mg/0.1 mL Place 1 spray into the nose as needed (opioid overdose).    [provider]  ondansetron (  ZOFRAN) 4 MG tablet Take 4 mg by mouth 2 (two) times daily as needed for nausea or vomiting.    [provider]    Current Medications Scheduled Meds:  amLODipine  10 mg Oral QHS   vitamin C  500 mg Oral Daily   aspirin EC  81 mg Oral Daily   cholecalciferol  1,000 Units Oral Daily   cloNIDine  0.1 mg Transdermal Q Sun   cycloSPORINE modified  200 mg Oral BID   cycloSPORINE modified  25 mg Oral QPM   ferric citrate  630 mg Oral TID WC   furosemide  60 mg Intravenous BID   gabapentin  100 mg Oral BID   insulin aspart  0-6 Units Subcutaneous TID WC   labetalol  600 mg Oral BID   molnupiravir EUA  4 capsule Oral BID   mycophenolate  540 mg Oral BID   predniSONE  15 mg Oral Daily   sodium zirconium cyclosilicate  10 g Oral Once per day on Sun Tue Thu Sat   zinc sulfate  220 mg Oral Daily   Continuous Infusions: PRN Meds:.acetaminophen **OR** acetaminophen, colchicine, diphenhydrAMINE,  hydrALAZINE, magnesium hydroxide, metoCLOPramide, naLOXone (NARCAN)  injection, ondansetron **OR** ondansetron (ZOFRAN) IV, traZODone  CBC Recent Labs  Lab 11/05/21 2317 11/05/21 2322 11/06/21 0441  WBC 3.1*  --  3.4*  NEUTROABS 2.3  --   --   HGB 12.5 13.3 11.9*  HCT 38.4 39.0 36.5  MCV 94.1  --  93.6  PLT 79*  --  75*   Basic Metabolic Panel Recent Labs  Lab 11/05/21 2317 11/05/21 2322 11/06/21 0441  NA 139 137 140  K 5.9* 5.8* 5.5*  CL 91* 96* 95*  CO2 28  --  27  GLUCOSE 79 72 74  BUN 65* 62* 72*  CREATININE 9.65* 10.50* 10.30*  CALCIUM 8.6*  --  8.3*    Physical Exam   Blood pressure (!) 160/82, pulse 82, temperature 98.5 F (36.9 C), temperature source Oral, resp. rate 16, height '5\' 4"'$  (1.626 m), weight 61.2 kg, SpO2 93 %. GEN: Groggy, awakens to voice, NAD ENT: NCAT EYES: EOMI CV: Regular, murmur of fistula noted PULM: Clear bilaterally, normal work of breathing ABD: Soft, nontender SKIN: No rashes or lesions EXT: No peripheral edema  Assessment 39F ESRD MWF iHD with AMS, COVID19+, ? Excessive narcotic dosing.  ESRD on HD [place] MWF RUE AVF AMS, likley mulifactorial COVID19 infection per primary ? Narcotic overdose, has rec narcan, per primary Mild hyperkalemia, chronic Pulm edema, on 2L [place] stable HTN, elevated, not usually to EDW Anemia: Hb stable BMM: hectorol, sensipar and Auryxia as outpatient  Plan Plan for HD later today, 3.5h, 2K, 3LUF, AVF, tight heparin, 400/600 Cont supportive care, COVID directed therapies   Rexene Agent  11/06/2021, 12:35 PM

## 2021-11-06 NOTE — Progress Notes (Incomplete Revision)
PROGRESS NOTE    [name] [name]  OFB:[id number] DOB: [birth year] DOA: 11/05/2021 PCP: Patient, No Pcp Per (Inactive)    Chief Complaint  Patient presents with   Altered Mental Status    Brief Narrative:  Patient is a 41 year old unfortunate female history of end-stage renal disease on HD on TTS, status post renal and pancreatic transplant, complicated by medical nonadherence and rejection, hypertension, gastroparesis, anxiety, anemia and narcotic abuse presented to the ED with acute onset of altered mental status and confusion.  It was noted that 1 of patient's friends had not heard from her for the last couple of days and EMS called.  When EMS got there patient noted to be conscious but disoriented.  Unknown as to whether patient had a last hemodialysis session.  Patient seen in the ED noted to have pinpoint pupils and altered mental status noted to be somnolent and arousable.  Patient given a dose of IV Narcan which significantly improved her mental status briefly.  Patient seen in the ED COVID-19 PCR obtained was positive.  Influenza PCR negative.  EKG showed normal sinus rhythm and some peaked T waves anterior laterally.  Initial high-sensitivity troponin was 76.  Chest x-ray showed mild pulmonary edema likely secondary to volume overload.  Noncontrast head CT negative    Assessment & Plan:  Principal Problem:   Acute encephalopathy Active Problems:   Pulmonary edema   Hypertensive urgency   Type 2 diabetes mellitus with peripheral neuropathy (HCC)   Gout   History of renal transplant   Hyperkalemia   COVID-19 virus infection    Assessment and Plan: * Acute encephalopathy - This is likely multifactorial.  It could be related to COVID encephalopathy as well as uremia with end-stage renal disease on hemodialysis with question about compliance and certainly polypharmacy with opiate overdose, given response to IV Narcan.. -  Patient opens eyes to verbal stimuli but drowsy and drifts  back off to sleep with some somnolence. -CT head negative for any acute abnormalities. -Continue neurochecks for another 24 hours. -Continue IV Narcan as needed we will give a dose now. -Avoid sedatives -Continue treatment for COVID of molnupiravir, vitamin C, zinc, vitamin D. -Monitor inflammatory markers. -TSH within normal limits. -Check an ammonia level -Nephrology consulted to assess for need for urgent hemodialysis. -Supportive care.  Pulmonary edema - Pulmonary edema noted on chest x-ray likely secondary to fluid overload secondary to end-stage renal disease with probable noncompliance with hemodialysis.   -On IV Lasix. -Unsure as to whether patient makes urine. -Nephrology consulted for hemodialysis.  Hypertensive urgency - Continue Norvasc, clonidine patch, labetalol. -Cozaar on hold secondary to hyperkalemia. -Place on IV hydralazine as needed.  Type 2 diabetes mellitus with peripheral neuropathy (HCC) - Hemoglobin A1c 4.6 (12/20/2018 ) -Repeat hemoglobin A1c.   -Continue Neurontin. -Place on SSI.  Gout - Colchicine as needed.  History of renal transplant -She is status post renal and pancreatic transplant with antirejection medication noncompliance. - She is currently being evaluated for renal transplant. - Continue prednisone and cyclosporine.   -Nephrology consult.   COVID-19 virus infection - Patient noted to be positive for COVID-19 infection -Follow inflammatory markers. -Patient chronically on prednisone. -Continue molnupiravir. -Supportive care.    Hyperkalemia - This was managed with D50 insulin and will be followed. - On Lokelma.   -May need HD.   -Nephrology consulted.           DVT prophylaxis: SCDs Code Status: Full Family Communication: No family at bedside. Disposition: TBD  Status is: Inpatient Remains inpatient appropriate because: Severity of illness           Consultants:  Nephrology: Dr. Joelyn Oms  Procedures:   CT head 11/06/2021 Chest x-ray 11/06/2021  Antimicrobials:  None   Subjective: Patient sleeping arousable but rates back up to sleep.  Alert to self only.  Still pretty confused/obtunded.  Answers yes to any shortness of breath, any chest pain, any abdominal pain.  Objective: Vitals:   11/06/21 0325 11/06/21 0344 11/06/21 0700 11/06/21 0900  BP: (!) 185/82 (!) 186/83 (!) 165/84 (!) 160/82  Pulse: 92 88 88 82  Resp: '12  17 16  '$ Temp: 99.7 F (37.6 C)   98.5 F (36.9 C)  TempSrc: Oral   Oral  SpO2: 93%  95% 93%  Weight:      Height:        Intake/Output Summary (Last 24 hours) at 11/06/2021 1027 Last data filed at 11/06/2021 0500 Gross per 24 hour  Intake 20 ml  Output --  Net 20 ml   Filed Weights   11/06/21 0221  Weight: 61.2 kg    Examination:  General exam: Sleeping.  Drowsy/lethargic. Respiratory system: Diffuse crackles noted.  No wheezing.  No rhonchi.  Fair air movement.  Cardiovascular system: S1 & S2 heard, RRR. No JVD, murmurs, rubs, gallops or clicks. No pedal edema. Gastrointestinal system: Abdomen is nondistended, soft and nontender. No organomegaly or masses felt. Normal bowel sounds heard. Central nervous system: Alert and oriented. No focal neurological deficits. Extremities: Symmetric 5 x 5 power. Skin: No rashes, lesions or ulcers Psychiatry: Judgement and insight appear poor. Mood & affect appropriate.     Data Reviewed:   CBC: Recent Labs  Lab 11/05/21 2317 11/05/21 2322 11/06/21 0441  WBC 3.1*  --  3.4*  NEUTROABS 2.3  --   --   HGB 12.5 13.3 11.9*  HCT 38.4 39.0 36.5  MCV 94.1  --  93.6  PLT 79*  --  75*    Basic Metabolic Panel: Recent Labs  Lab 11/05/21 2317 11/05/21 2322 11/06/21 0441  NA 139 137 140  K 5.9* 5.8* 5.5*  CL 91* 96* 95*  CO2 28  --  27  GLUCOSE 79 72 74  BUN 65* 62* 72*  CREATININE 9.65* 10.50* 10.30*  CALCIUM 8.6*  --  8.3*    GFR: Estimated Creatinine Clearance: 6.3 mL/min (A) (by C-G formula based on  SCr of 10.3 mg/dL (H)).  Liver Function Tests: Recent Labs  Lab 11/05/21 2317  AST 27  ALT 15  ALKPHOS 71  BILITOT 0.8  PROT 6.6  ALBUMIN 3.4*    CBG: Recent Labs  Lab 11/05/21 2316 11/06/21 0111 11/06/21 0151  GLUCAP 74 75 117*     Recent Results (from the past 240 hour(s))  Resp Panel by RT-PCR (Flu A&B, Covid) Nasopharyngeal Swab     Status: Abnormal   Collection Time: 11/05/21 11:23 PM   Specimen: Nasopharyngeal Swab; Nasopharyngeal(NP) swabs in vial transport medium  Result Value Ref Range Status   SARS Coronavirus 2 by RT PCR POSITIVE (A) NEGATIVE Final    Comment: (NOTE) SARS-CoV-2 target nucleic acids are DETECTED.  The SARS-CoV-2 RNA is generally detectable in upper respiratory specimens during the acute phase of infection. Positive results are indicative of the presence of the identified virus, but do not rule out bacterial infection or co-infection with other pathogens not detected by the test. Clinical correlation with patient history and other diagnostic information is necessary to determine  patient infection status. The expected result is Negative.  Fact Sheet for Patients: EntrepreneurPulse.com.au  Fact Sheet for Healthcare Providers: IncredibleEmployment.be  This test is not yet approved or cleared by the [place] FDA and  has been authorized for detection and/or diagnosis of SARS-CoV-2 by FDA under an Emergency Use Authorization (EUA).  This EUA will remain in effect (meaning this test can be used) for the duration of  the COVID-19 declaration under Section 564(b)(1) of the A ct, 21 U.S.C. section 360bbb-3(b)(1), unless the authorization is terminated or revoked sooner.     Influenza A by PCR NEGATIVE NEGATIVE Final   Influenza B by PCR NEGATIVE NEGATIVE Final    Comment: (NOTE) The Xpert Xpress SARS-CoV-2/FLU/RSV plus assay is intended as an aid in the diagnosis of influenza from Nasopharyngeal swab  specimens and should not be used as a sole basis for treatment. Nasal washings and aspirates are unacceptable for Xpert Xpress SARS-CoV-2/FLU/RSV testing.  Fact Sheet for Patients: EntrepreneurPulse.com.au  Fact Sheet for Healthcare Providers: IncredibleEmployment.be  This test is not yet approved or cleared by the [place] FDA and has been authorized for detection and/or diagnosis of SARS-CoV-2 by FDA under an Emergency Use Authorization (EUA). This EUA will remain in effect (meaning this test can be used) for the duration of the COVID-19 declaration under Section 564(b)(1) of the Act, 21 U.S.C. section 360bbb-3(b)(1), unless the authorization is terminated or revoked.  Performed at [facility], [place] [address], [place],  [address]          Radiology Studies: CT Head Wo Contrast  Result Date: 11/06/2021 CLINICAL DATA:  Altered mental status EXAM: CT HEAD WITHOUT CONTRAST TECHNIQUE: Contiguous axial images were obtained from the base of the skull through the vertex without intravenous contrast. RADIATION DOSE REDUCTION: This exam was performed according to the departmental dose-optimization program which includes automated exposure control, adjustment of the mA and/or kV according to patient size and/or use of iterative reconstruction technique. COMPARISON:  11/04/2018 FINDINGS: Brain: No evidence of acute infarction, hemorrhage, hydrocephalus, extra-axial collection or mass lesion/mass effect. 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 Skull: Normal. Negative for fracture or focal lesion. Sinuses/Orbits: No acute finding. Other: None. IMPRESSION: No acute intracranial abnormality noted. Electronically Signed   By: Inez Catalina M.D.   On: 11/06/2021 01:57   DG Chest Portable 1 View  Result Date: 11/06/2021 CLINICAL DATA:  Difficulty breathing EXAM: PORTABLE  CHEST 1 VIEW COMPARISON:  12/08/2020 FINDINGS: Cardiac shadow is enlarged but stable. Previously seen dialysis catheter has been removed in the interval. Generalize pulmonary edema is noted likely related to volume overload. No focal infiltrate or effusion is seen. No bony abnormality is noted. IMPRESSION: Mild pulmonary edema likely related to volume overload. Electronically Signed   By: Inez Catalina M.D.   On: 11/06/2021 00:35        Scheduled Meds:  amLODipine  10 mg Oral QHS   vitamin C  500 mg Oral Daily   aspirin EC  81 mg Oral Daily   cholecalciferol  1,000 Units Oral Daily   cloNIDine  0.1 mg Transdermal Q Sun   cycloSPORINE modified  200 mg Oral BID   cycloSPORINE modified  25 mg Oral QPM   ferric citrate  630 mg Oral TID WC   furosemide  60 mg Intravenous BID   gabapentin  100 mg Oral BID   insulin aspart  0-6 Units Subcutaneous TID WC  labetalol  600 mg Oral BID   molnupiravir EUA  4 capsule Oral BID   mycophenolate  540 mg Oral BID   predniSONE  15 mg Oral Daily   sodium zirconium cyclosilicate  10 g Oral Once per day on Sun Tue Thu Sat   zinc sulfate  220 mg Oral Daily   Continuous Infusions:   LOS: 0 days    Time spent: 40 minutes    Irine Seal, MD [name]   To contact the attending provider between 7A-7P or the covering provider during after hours 7P-7A, please log into the web site [email or web address] and access using universal [place] password for that web site. If you do not have the password, please call the hospital operator.  11/06/2021, 10:27 AM

## 2021-11-06 NOTE — Assessment & Plan Note (Addendum)
-   Hemoglobin A1c 4.6 (12/20/2018 ) ?-Repeat hemoglobin A1c 3.9. ?-Discontinue Neurontin secondary to altered mental status ?-Discontinue sliding scale insulin. ?

## 2021-11-06 NOTE — Assessment & Plan Note (Addendum)
-   Improving on current regimen of Norvasc, clonidine patch, labetalol.  ?-Cozaar on hold secondary to hyperkalemia.  ?-IV hydralazine as needed.  ?

## 2021-11-06 NOTE — Assessment & Plan Note (Addendum)
-   Colchicine as needed. ?

## 2021-11-06 NOTE — Assessment & Plan Note (Deleted)
-   The patient will be placed on supplement coverage with NovoLog 

## 2021-11-06 NOTE — Assessment & Plan Note (Signed)
-   Patient noted to be positive for COVID-19 infection ?-Follow inflammatory markers. ?-Patient chronically on prednisone. ?-Continue molnupiravir. ?-Supportive care. ? ? ?

## 2021-11-06 NOTE — Assessment & Plan Note (Addendum)
-  She is status post renal and pancreatic transplant with antirejection medication noncompliance. ?- She is currently being evaluated for renal transplant. ?- Continue prednisone and cyclosporine.   ?-Nephrology following. ?

## 2021-11-06 NOTE — H&P (Addendum)
[place]   PATIENT NAME: [name] [name]    MR#:  [id number]  DATE OF BIRTH:  1980/12/13  DATE OF ADMISSION:  11/05/2021  PRIMARY CARE PHYSICIAN: Patient, No Pcp Per (Inactive)   Patient is coming from: Home  REQUESTING/REFERRING PHYSICIAN: Kommor, [place], MD  CHIEF COMPLAINT:   Chief Complaint  Patient presents with   Altered Mental Status    HISTORY OF PRESENT ILLNESS:  [name] [name] is a 41 y.o. female with medical history significant for end-stage renal disease on hemodialysis on TTS, s/p renal and pancreatic transplant, complicated by medication nonadherence and rejection, hypertension, gastroparesis, anxiety, anemia and narcotic abuse, presented to the ER with acute onset of altered mental status with confusion.  One of her friends has not heard about from her for the last couple of days and therefore EMS was called.  She was conscious but disoriented.  It was unclear if she had her last hemodialysis session.  She arrived to the ER with pinpoint pupils and altered mental status.  She was still somnolent but arousable during my interview.  She denied any paresthesias or focal muscle weakness.  No blurred vision or diplopia.  She denied any headache or dizziness.  No fever or chills.  No chest pain or palpitations.  No cough or wheezing or dyspnea.  No nausea or vomiting until she was given IV Narcan which significantly improved her mental status briefly.  She could not answer how many oxycodone tablets she took.  She is also on Ambien and it is unclear if she took it as well.  ED Course: When she came to the ER BP was 187/79 with respiratory rate of 23 and otherwise normal vital signs. Labs revealed a potassium of 5.8 and chloride of 96 with a BUN of 62 and creatinine 10.5.  Albumin was 3.4 and LFTs were otherwise normal.  High-sensitivity troponin I was 76.  CBC showed a WBC of 3.1 that is chronic and thrombocytopenia of 79.  COVID-19 PCR came back positive and influenza  antigens were negative. EKG as reviewed by me : Showed normal sinus rhythm with a rate of 80 with poor R wave progression and peaked T waves anterolaterally. Imaging: Portable chest ray showed mild pulmonary edema likely related to volume overload.Noncontrasted head CT scan showed no acute intracranial abnormalities. PAST MEDICAL HISTORY:   Past Medical History:  Diagnosis Date   Anemia    Anxiety    ESRD (end stage renal disease) ([place])    Henmodialysis   Gastroparesis    Hypertension    Narcotic abuse ([place])    Non compliance with medical treatment    Renal disorder 2015   transplant   Vision loss     PAST SURGICAL HISTORY:   Past Surgical History:  Procedure Laterality Date   A/V FISTULAGRAM Right 02/17/2021   Procedure: A/V FISTULAGRAM;  Surgeon: [name], MD;  Location: [facility];  Service: Cardiovascular;  Laterality: Right;   AV FISTULA PLACEMENT  11/17/2011   Procedure: ARTERIOVENOUS (AV) FISTULA CREATION;  Surgeon: Rosetta Posner, MD;  Location: [facility];  Service: Vascular;  Laterality: Right;   EYE SURGERY     HEMATOMA EVACUATION Right 02/25/2021   Procedure: EVACUATION HEMATOMA RIGHT CHEST;  Surgeon: Waynetta Sandy, MD;  Location: [facility];  Service: Vascular;  Laterality: Right;   INSERTION OF DIALYSIS CATHETER Right 12/08/2020   Procedure: INSERTION OF TUNNELED DIALYSIS CATHETER;  Surgeon: [name], MD;  [facility];  Service: Vascular;  Laterality: Right;   IR REMOVAL TUN CV CATH W/O FL  05/03/2021   KIDNEY TRANSPLANT     NEPHRECTOMY TRANSPLANTED ORGAN     pancrease transplant     PERIPHERAL VASCULAR BALLOON ANGIOPLASTY Right 02/17/2021   Procedure: PERIPHERAL VASCULAR BALLOON ANGIOPLASTY;  Surgeon: [name], MD;  Location: [facility];  Service: Cardiovascular;  Laterality: Right;   REFRACTIVE SURGERY     REVISON OF ARTERIOVENOUS FISTULA Right 12/08/2020   Procedure: RIGHT ARM ARTERIOVENOUS FISTULA REVISION AND  RESECTION;  Surgeon: [name], MD;  Location: [facility];  Service: Vascular;  Laterality: Right;   REVISON OF ARTERIOVENOUS FISTULA Right 02/25/2021   Procedure: RIGHT ARM ARTERIOVENOUS FISTULA REVISION WITH TRANSPOSITION OF CEPHALIC VEIN ON AXILLARY VEIN;  Surgeon: [name], MD;  Location: [facility];  Service: Vascular;  Laterality: Right;    SOCIAL HISTORY:   Social History   Tobacco Use   Smoking status: Former    Types: Pipe   Smokeless tobacco: Never   Tobacco comments:    Hooka  Substance Use Topics   Alcohol use: No    FAMILY HISTORY:   Family History  Problem Relation Age of Onset   Hypertension Father     DRUG ALLERGIES:   Allergies  Allergen Reactions   Iodinated Contrast Media     Other reaction(s): Unknown    REVIEW OF SYSTEMS:   ROS As per history of present illness. All pertinent systems were reviewed above. Constitutional, HEENT, cardiovascular, respiratory, GI, GU, musculoskeletal, neuro, psychiatric, endocrine, integumentary and hematologic systems were reviewed and are otherwise negative/unremarkable except for positive findings mentioned above in the HPI.   MEDICATIONS AT HOME:   Prior to Admission medications   Medication Sig Start Date End Date Taking? Authorizing Provider  amLODipine (NORVASC) 10 MG tablet Take 10 mg by mouth at bedtime. 09/19/18  Yes [provider]  CATAPRES-TTS-1 0.1 MG/24HR patch Place 0.1 mg onto the skin once a week. 09/26/21  Yes [provider]  ferric citrate (AURYXIA) 1 GM 210 MG(Fe) tablet Take 630 mg by mouth 3 (three) times daily with meals.   Yes [provider]  furosemide (LASIX) 20 MG tablet Take 40 mg by mouth 4 (four) times a week. Take 40 mg on Tues, Thurs, Sat, and Sun (non-dialysis days) 09/12/19  Yes [provider]  gabapentin (NEURONTIN) 100 MG capsule Take 100 mg by mouth 2 (two) times daily. 10/12/21  Yes [provider]  labetalol (NORMODYNE) 200 MG  tablet Take 600 mg by mouth 2 (two) times daily. 09/12/19  Yes [provider]  LOKELMA 10 g PACK packet Take 10 g by mouth See admin instructions. Non dialysis days [place]) 10/13/21  Yes [provider]  losartan (COZAAR) 100 MG tablet Take 100 mg by mouth at bedtime. 02/02/21  Yes [provider]  metoCLOPramide (REGLAN) 5 MG tablet Take 5 mg by mouth every 6 (six) hours as needed for nausea or vomiting. 09/03/21  Yes [provider]  mycophenolate (MYFORTIC) 180 MG EC tablet Take 540 mg by mouth 2 (two) times daily.   Yes [provider]  oxyCODONE-acetaminophen (PERCOCET/ROXICET) 5-325 MG tablet Take 1-2 tablets by mouth every 6 (six) hours as needed for severe pain. Patient taking differently: Take 1 tablet by mouth 3 (three) times daily as needed for moderate pain. 03/22/21  Yes [name], MD  predniSONE (DELTASONE) 5 MG tablet Take 15 mg by mouth  daily. 06/24/15  Yes [provider]  zolpidem (AMBIEN) 10 MG tablet Take 10 mg by mouth at bedtime as needed for sleep.   Yes [provider]  aspirin EC 81 MG tablet Take 81 mg by mouth daily.    [provider]  colchicine 0.6 MG tablet Take 0.6 mg by mouth 3 (three) times daily as needed (gout flares). 02/28/21   [provider]  cycloSPORINE modified (NEORAL) 100 MG capsule Take 200 mg by mouth 2 (two) times daily.    [provider]  cycloSPORINE modified (NEORAL) 25 MG capsule Take 25 mg by mouth every evening.    [provider]  diphenhydrAMINE (BENADRYL) 25 MG tablet Take 25 mg by mouth every 6 (six) hours as needed for itching.    [provider]  heparin 1000 unit/mL SOLN injection Heparin Sodium (Porcine) 1,000 Units/mL Catheter Lock Arterial 12/22/20 12/21/21  [provider]  HYDROcodone-acetaminophen (NORCO) 5-325 MG tablet Take 1 tablet by mouth every 6 (six) hours as needed for up to 12 doses  for severe pain. Patient not taking: No sig reported 03/02/21   Corena Herter, PA-C  lidocaine-prilocaine (EMLA) cream Apply 1 application topically daily as needed (port access). 07/09/20   [provider]  Methoxy PEG-Epoetin Beta (MIRCERA IJ) Mircera 01/03/21 01/02/22  [provider]  naloxone [facility]) nasal spray 4 mg/0.1 mL Place 1 spray into the nose as needed (opioid overdose).    [provider]  ondansetron (ZOFRAN) 4 MG tablet Take 4 mg by mouth 2 (two) times daily as needed for nausea or vomiting.    [provider]      VITAL SIGNS:  Blood pressure (!) 186/83, pulse 88, temperature 99.7 F (37.6 C), temperature source Oral, resp. rate 12, height '5\' 4"'$  (1.626 m), weight 61.2 kg, SpO2 93 %.  PHYSICAL EXAMINATION:  Physical Exam  GENERAL:  41 y.o.-year-old female patient lying in the bed with no acute distress.  She was somnolent but arousable.  She was alert and oriented x1 to place.  She could not repeat her name after I told it to her I was not oriented to time. EYES: Pupils equal, round, reactive to light and accommodation. No scleral icterus. Extraocular muscles intact.  HEENT: Head atraumatic, normocephalic. Oropharynx and nasopharynx clear.  NECK:  Supple, no jugular venous distention. No thyroid enlargement, no tenderness.  LUNGS: Normal breath sounds bilaterally, no wheezing, rales,rhonchi or crepitation. No use of accessory muscles of respiration.  CARDIOVASCULAR: Regular rate and rhythm, S1, S2 normal. No murmurs, rubs, or gallops.  ABDOMEN: Soft, nondistended, nontender. Bowel sounds present. No organomegaly or mass.  EXTREMITIES: No pedal edema, cyanosis, or clubbing.  NEUROLOGIC: Cranial nerves II through XII are intact. Muscle strength 5/5 in all extremities. Sensation intact. Gait not checked.  PSYCHIATRIC: The patient is alert and oriented X1 as mentioned above.  No no good eye contact. SKIN: No obvious rash, lesion, or ulcer.    LABORATORY PANEL:   CBC Recent Labs  Lab 11/06/21 0441  WBC 3.4*  HGB 11.9*  HCT 36.5  PLT 75*   ------------------------------------------------------------------------------------------------------------------  Chemistries  Recent Labs  Lab 11/05/21 2317 11/05/21 2322 11/06/21 0441  NA 139   < > 140  K 5.9*   < > 5.5*  CL 91*   < > 95*  CO2 28  --  27  GLUCOSE 79   < > 74  BUN 65*   < > 72*  CREATININE 9.65*   < >  10.30*  CALCIUM 8.6*  --  8.3*  AST 27  --   --   ALT 15  --   --   ALKPHOS 71  --   --   BILITOT 0.8  --   --    < > = values in this interval not displayed.   ------------------------------------------------------------------------------------------------------------------  Cardiac Enzymes No results for input(s): TROPONINI in the last 168 hours. ------------------------------------------------------------------------------------------------------------------  RADIOLOGY:  CT Head Wo Contrast  Result Date: 11/06/2021 CLINICAL DATA:  Altered mental status EXAM: CT HEAD WITHOUT CONTRAST TECHNIQUE: Contiguous axial images were obtained from the base of the skull through the vertex without intravenous contrast. RADIATION DOSE REDUCTION: This exam was performed according to the departmental dose-optimization program which includes automated exposure control, adjustment of the mA and/or kV according to patient size and/or use of iterative reconstruction technique. COMPARISON:  11/04/2018 FINDINGS: Brain: No evidence of acute infarction, hemorrhage, hydrocephalus, extra-axial collection or mass lesion/mass effect. 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 Skull: Normal. Negative for fracture or focal lesion. Sinuses/Orbits: No acute finding. Other: None. IMPRESSION: No acute intracranial abnormality noted. Electronically Signed   By: Inez Catalina M.D.   On: 11/06/2021 01:57   DG Chest  Portable 1 View  Result Date: 11/06/2021 CLINICAL DATA:  Difficulty breathing EXAM: PORTABLE CHEST 1 VIEW COMPARISON:  12/08/2020 FINDINGS: Cardiac shadow is enlarged but stable. Previously seen dialysis catheter has been removed in the interval. Generalize pulmonary edema is noted likely related to volume overload. No focal infiltrate or effusion is seen. No bony abnormality is noted. IMPRESSION: Mild pulmonary edema likely related to volume overload. Electronically Signed   By: Inez Catalina M.D.   On: 11/06/2021 00:35      IMPRESSION AND PLAN:  Assessment and Plan: * Acute encephalopathy - This is likely multifactorial.  It could be related to COVID encephalopathy as well as uremia with end-stage renal disease on hemodialysis with question about compliance and certainly polypharmacy with opiate overdose, given response to IV Narcan.. - She will be admitted to a PCU bed. - We will follow neurochecks every 4 hours for 24 hours. - We will utilize Narcan as needed. - We will avoid sedatives. - We will check TSH and ammonia level. - We will place her on p.o. Molnupiravir. - We will add vitamin D3, vitamin C and zinc sulfate. - We will follow inflammatory markers. -Nephrology consult will be obtained to assess the need for urgent hemodialysis.  Contact was made by the ER physician.  Pulmonary edema - This is clearly secondary to fluid overload with ESRD with possible noncompliance with hemodialysis. - She will be placed on IV Lasix mainly for pulmonary congestion. - Nephrology consult will be obtained for hemodialysis as mentioned above  Hypertensive urgency - The patient will be placed on as needed IV labetalol. - We will continue her antihypertensives while holding off Cozaar given hyperkalemia  Type 2 diabetes mellitus with peripheral neuropathy (HCC) - We will continue Neurontin. - We will place the patient on supplement coverage with NovoLog as mentioned above.  Gout - We will  continue colchicine.  History of renal transplant -She is status post renal and pancreatic transplant with antirejection medication noncompliance. - She is currently being evaluated for renal transplant. - We will continue cyclosporine and prednisone.  Hyperkalemia - This was managed with D50 insulin and will be followed. - This should correct with hemodialysis.  DVT prophylaxis: SCDs.  Medical prophylaxis currently contraindicated due to thrombocytopenia. Advanced Care Planning:  Code Status: full code. Family Communication:  The plan of care was discussed in details with the patient (and family). I answered all questions. The patient agreed to proceed with the above mentioned plan. Further management will depend upon hospital course. Disposition Plan: Back to previous home environment Consults called: Nephrology.  If there is no improvement of her mental status, neurology consult may be needed. All the records are reviewed and case discussed with ED provider.  Status is: Inpatient   At the time of the admission, it appears that the appropriate admission status for this patient is inpatient.  This is judged to be reasonable and necessary in order to provide the required intensity of service to ensure the patient's safety given the presenting symptoms, physical exam findings and initial radiographic and laboratory data in the context of comorbid conditions.  The patient requires inpatient status due to high intensity of service, high risk of further deterioration and high frequency of surveillance required.  I certify that at the time of admission, it is my clinical judgment that the patient will require inpatient hospital care extending more than 2 midnights.                            Dispo: The patient is from: Home              Anticipated d/c is to: Home              Patient currently is not medically stable to d/c.              Difficult to place patient: No  Christel Mormon  M.D on 11/06/2021 at 6:29 AM  [name]   From 7 PM-7 AM, contact night-coverage [email or web address]  CC: Primary care physician; Patient, No Pcp Per (Inactive)

## 2021-11-06 NOTE — Assessment & Plan Note (Deleted)
-   This was managed with D50 insulin and will be followed. ?- This should correct with hemodialysis. ?

## 2021-11-06 NOTE — Assessment & Plan Note (Addendum)
-   This was managed with D50 insulin on presentation to the ED.  ?-Patient was on [place].  ?-Status post hemodialysis with resolution of hyperkalemia.  ?-Discontinue Lokelma.  ?-Nephrology following. ?

## 2021-11-06 NOTE — ED Notes (Signed)
Provider at bedside speaking with pt significant other at this time ?

## 2021-11-06 NOTE — Assessment & Plan Note (Addendum)
-   This is likely multifactorial.  It could be related to COVID encephalopathy as well as uremia with end-stage renal disease on hemodialysis with question about compliance and certainly polypharmacy with opiate overdose, given response to IV Narcan.,  Concern for infectious etiology as patient to have a Tmax of 102.9 this morning and late last night. ?-  Patient alert but not following commands.  Moving extremities spontaneously.   ?-CT head negative for any acute abnormalities. ?-Continue neurochecks for another 24 hours. ?-Continue IV Narcan as needed. ?-Discontinue Neurontin, trazodone. ?-Avoid sedatives ?-Continue treatment for COVID of molnupiravir, vitamin C, zinc, vitamin D. ?-Monitor inflammatory markers. ?-TSH within normal limits. ?-Ammonia level elevated at 98 ?-Nephrology consulted and patient noted to have underwent hemodialysis earlier this morning around 2 AM ?-Lactulose enema ordered however patient not holding onto enema per RN and as such we will place an NG tube and place on lactulose per NG tube. ?-Repeat chest x-ray, CT head, check UA with cultures and sensitivities, check blood cultures x2. ?-Place empirically on IV vancomycin, IV cefepime, IV ampicillin, IV acyclovir. ?-Supportive care. ?-If no improvement on current treatment plan may need to consider LP. ?

## 2021-11-07 ENCOUNTER — Inpatient Hospital Stay (HOSPITAL_COMMUNITY): Payer: Medicare Other

## 2021-11-07 DIAGNOSIS — R509 Fever, unspecified: Secondary | ICD-10-CM | POA: Clinically undetermined

## 2021-11-07 DIAGNOSIS — E875 Hyperkalemia: Secondary | ICD-10-CM | POA: Diagnosis not present

## 2021-11-07 DIAGNOSIS — G9340 Encephalopathy, unspecified: Secondary | ICD-10-CM | POA: Diagnosis not present

## 2021-11-07 DIAGNOSIS — J810 Acute pulmonary edema: Secondary | ICD-10-CM | POA: Diagnosis not present

## 2021-11-07 DIAGNOSIS — I160 Hypertensive urgency: Secondary | ICD-10-CM | POA: Diagnosis not present

## 2021-11-07 LAB — COMPREHENSIVE METABOLIC PANEL
ALT: 16 U/L (ref 0–44)
AST: 28 U/L (ref 15–41)
Albumin: 4 g/dL (ref 3.5–5.0)
Alkaline Phosphatase: 73 U/L (ref 38–126)
Anion gap: 18 — ABNORMAL HIGH (ref 5–15)
BUN: 42 mg/dL — ABNORMAL HIGH (ref 6–20)
CO2: 25 mmol/L (ref 22–32)
Calcium: 8.9 mg/dL (ref 8.9–10.3)
Chloride: 95 mmol/L — ABNORMAL LOW (ref 98–111)
Creatinine, Ser: 6.37 mg/dL — ABNORMAL HIGH (ref 0.44–1.00)
GFR, Estimated: 8 mL/min — ABNORMAL LOW (ref >60)
Glucose, Bld: 85 mg/dL (ref 70–99)
Potassium: 3.9 mmol/L (ref 3.5–5.1)
Sodium: 138 mmol/L (ref 135–145)
Total Bilirubin: 0.4 mg/dL (ref 0.3–1.2)
Total Protein: 8.2 g/dL — ABNORMAL HIGH (ref 6.5–8.1)

## 2021-11-07 LAB — URINALYSIS, ROUTINE W REFLEX MICROSCOPIC

## 2021-11-07 LAB — RENAL FUNCTION PANEL
Albumin: 3.1 g/dL — ABNORMAL LOW (ref 3.5–5.0)
Anion gap: 20 — ABNORMAL HIGH (ref 5–15)
BUN: 100 mg/dL — ABNORMAL HIGH (ref 6–20)
CO2: 22 mmol/L (ref 22–32)
Calcium: 7.5 mg/dL — ABNORMAL LOW (ref 8.9–10.3)
Chloride: 96 mmol/L — ABNORMAL LOW (ref 98–111)
Creatinine, Ser: 11.97 mg/dL — ABNORMAL HIGH (ref 0.44–1.00)
GFR, Estimated: 4 mL/min — ABNORMAL LOW
Glucose, Bld: 75 mg/dL (ref 70–99)
Phosphorus: 8.7 mg/dL — ABNORMAL HIGH (ref 2.5–4.6)
Potassium: >7.5 mmol/L (ref 3.5–5.1)
Sodium: 138 mmol/L (ref 135–145)

## 2021-11-07 LAB — GLUCOSE, CAPILLARY
Glucose-Capillary: 92 mg/dL (ref 70–99)
Glucose-Capillary: 88 mg/dL (ref 70–99)
Glucose-Capillary: 100 mg/dL — ABNORMAL HIGH (ref 70–99)
Glucose-Capillary: 113 mg/dL — ABNORMAL HIGH (ref 70–99)
Glucose-Capillary: 116 mg/dL — ABNORMAL HIGH (ref 70–99)

## 2021-11-07 LAB — CBC WITH DIFFERENTIAL/PLATELET
Abs Immature Granulocytes: 0.01 10*3/uL (ref 0.00–0.07)
Basophils Absolute: 0 10*3/uL (ref 0.0–0.1)
Basophils Relative: 0 %
Eosinophils Absolute: 0 10*3/uL (ref 0.0–0.5)
Eosinophils Relative: 0 %
HCT: 36.3 % (ref 36.0–46.0)
Hemoglobin: 11.8 g/dL — ABNORMAL LOW (ref 12.0–15.0)
Immature Granulocytes: 0 %
Lymphocytes Relative: 12 %
Lymphs Abs: 0.5 10*3/uL — ABNORMAL LOW (ref 0.7–4.0)
MCH: 30.5 pg (ref 26.0–34.0)
MCHC: 32.5 g/dL (ref 30.0–36.0)
MCV: 93.8 fL (ref 80.0–100.0)
Monocytes Absolute: 0.2 10*3/uL (ref 0.1–1.0)
Monocytes Relative: 6 %
Neutro Abs: 3.4 10*3/uL (ref 1.7–7.7)
Neutrophils Relative %: 82 %
Platelets: 64 10*3/uL — ABNORMAL LOW (ref 150–400)
RBC: 3.87 MIL/uL (ref 3.87–5.11)
RDW: 12.7 % (ref 11.5–15.5)
WBC: 4.2 10*3/uL (ref 4.0–10.5)
nRBC: 0 % (ref 0.0–0.2)

## 2021-11-07 LAB — HEPATITIS B SURFACE ANTIBODY,QUALITATIVE: Hep B S Ab: REACTIVE — AB

## 2021-11-07 LAB — BLOOD GAS, ARTERIAL
Acid-Base Excess: 2.8 mmol/L — ABNORMAL HIGH (ref 0.0–2.0)
Bicarbonate: 27.2 mmol/L (ref 20.0–28.0)
Drawn by: 24610
O2 Saturation: 95.7 %
Patient temperature: 38.5
pCO2 arterial: 43 mmHg (ref 32–48)
pH, Arterial: 7.42 (ref 7.35–7.45)
pO2, Arterial: 90 mmHg (ref 83–108)

## 2021-11-07 LAB — AMMONIA: Ammonia: 42 umol/L — ABNORMAL HIGH (ref 9–35)

## 2021-11-07 LAB — C-REACTIVE PROTEIN: CRP: 0.9 mg/dL (ref <1.0)

## 2021-11-07 LAB — URINALYSIS, MICROSCOPIC (REFLEX)
RBC / HPF: >50 RBC/hpf (ref 0–5)
Squamous Epithelial / HPF: >50 (ref 0–5)

## 2021-11-07 LAB — FERRITIN: Ferritin: 5057 ng/mL — ABNORMAL HIGH (ref 11–307)

## 2021-11-07 LAB — HEPATITIS B SURFACE ANTIGEN: Hepatitis B Surface Ag: NONREACTIVE

## 2021-11-07 IMAGING — CT CT HEAD W/O CM
4 of 5 series · 16 of 47 positions shown, 18 images · non-contrast
Comparison: [DATE]

CLINICAL DATA: Altered mental status of uncertain etiology



[Series 3: head without · axial · non-contrast · 0.42mm/px · z∈[+306,+406]mm · 5 of 32 slices shown, 7 images]
[im 6/32  brain]
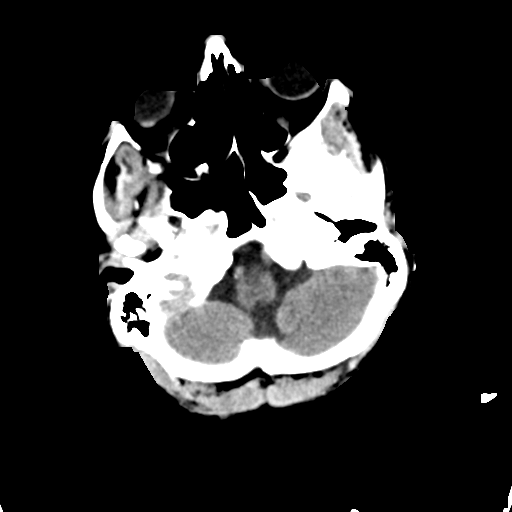
[im 6/32  bone]
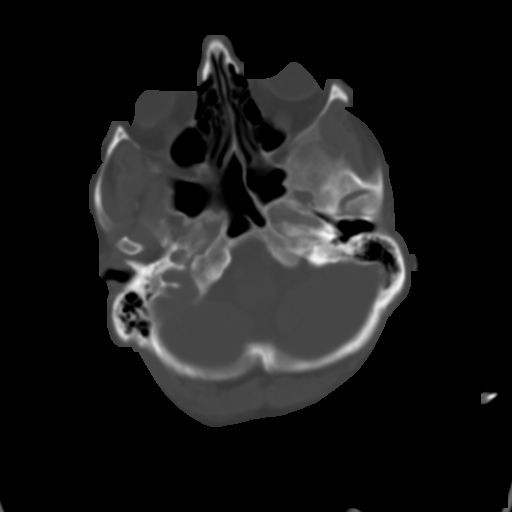
[im 11/32  brain]
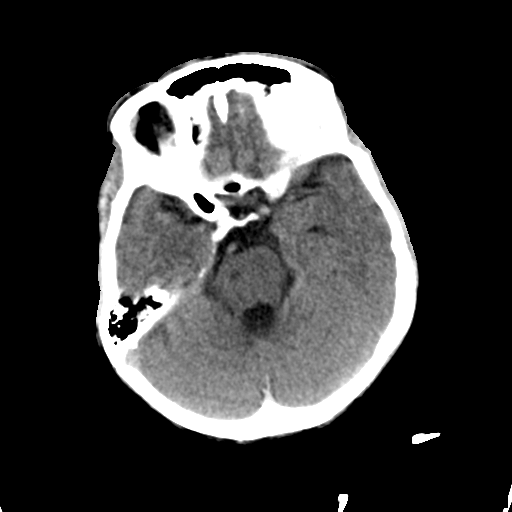
[im 16/32  brain]
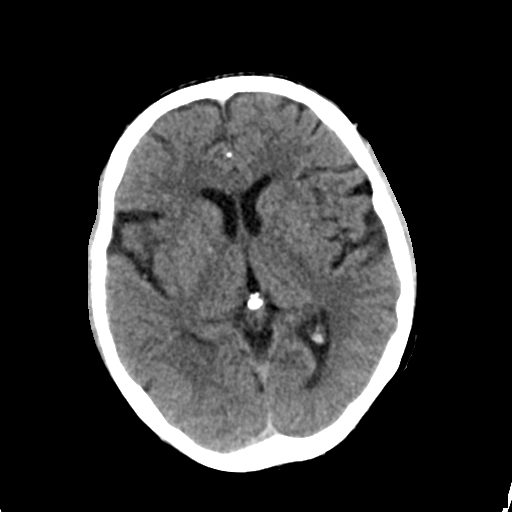
[im 21/32  brain]
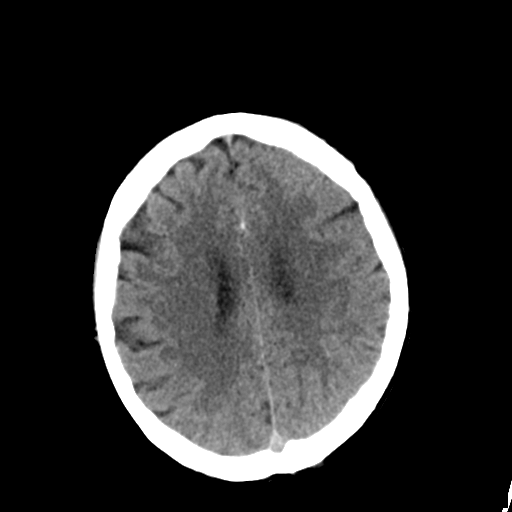
[im 26/32  brain]
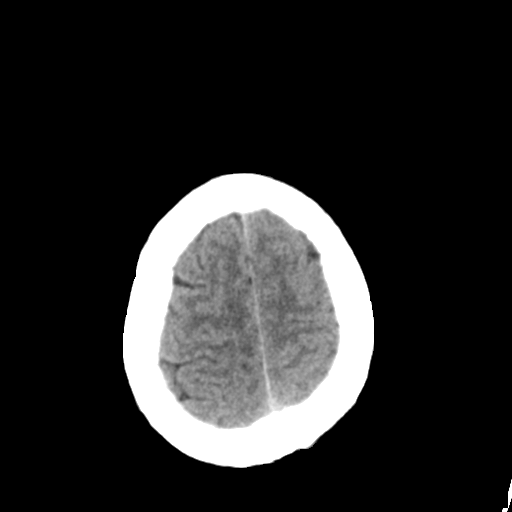
[im 26/32  bone]
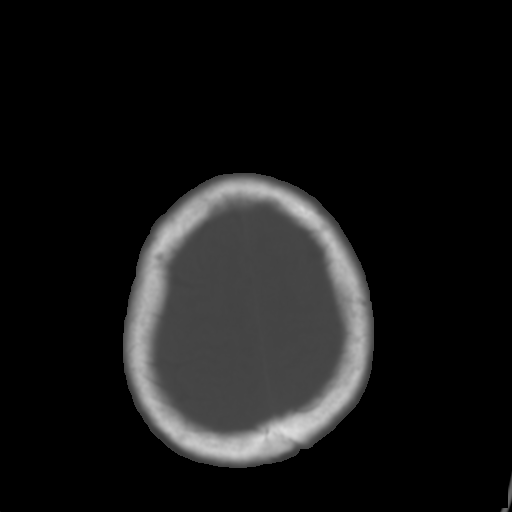

[Series 4: head without ax · axial · non-contrast · 0.38mm/px · z∈[+313,+405]mm · 5 of 32 slices shown]
[im 6/32  brain]
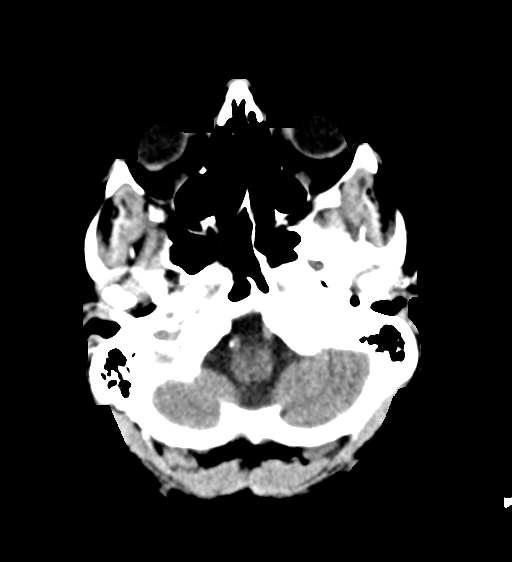
[im 11/32  brain]
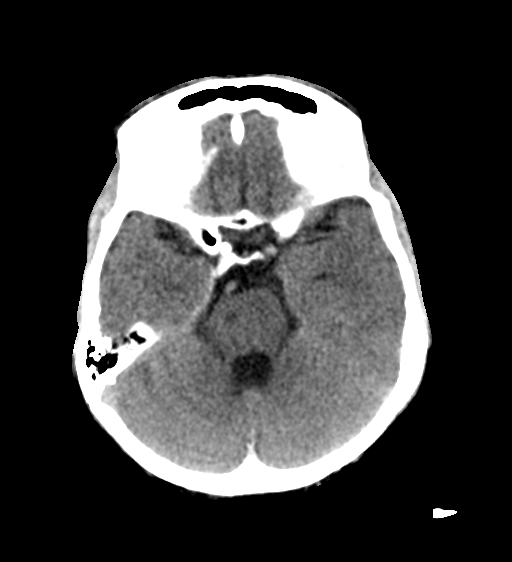
[im 16/32  brain]
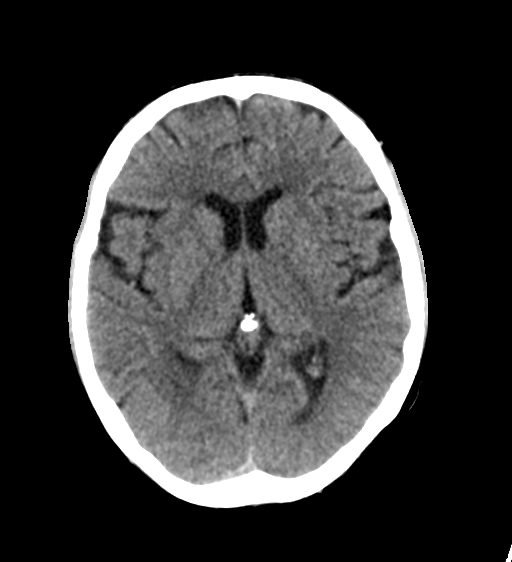
[im 21/32  brain]
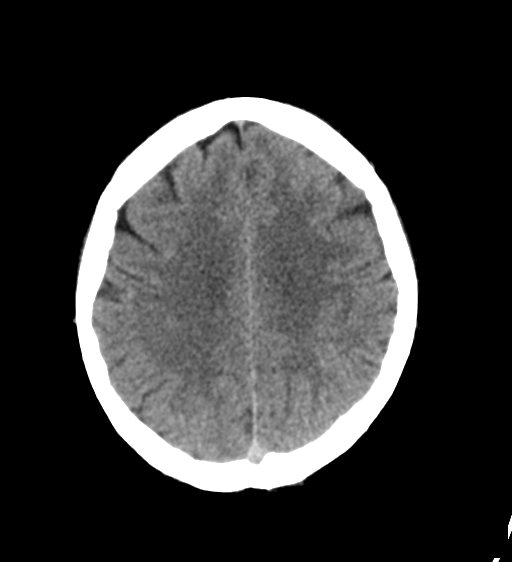
[im 26/32  brain]
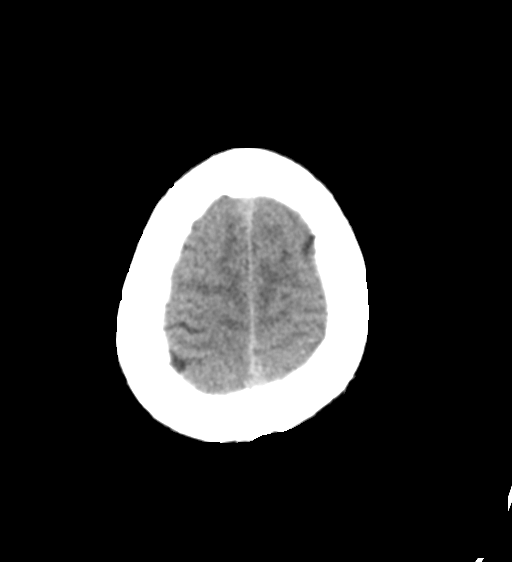

[Series 6: head without cor · coronal · non-contrast · 0.29mm/px · 3 of 62 slices shown]
[im 21/62  brain]
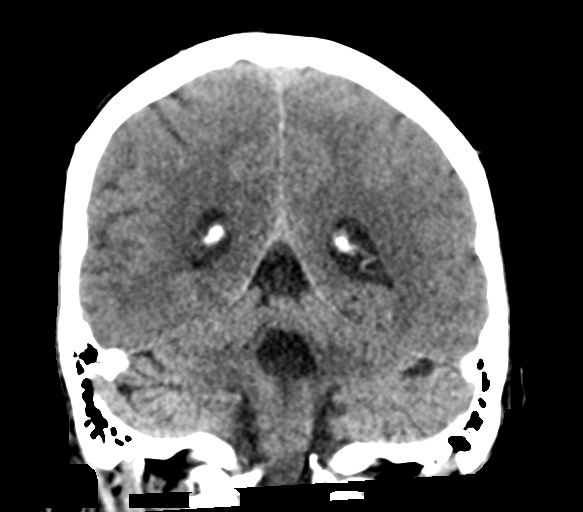
[im 28/62  brain]
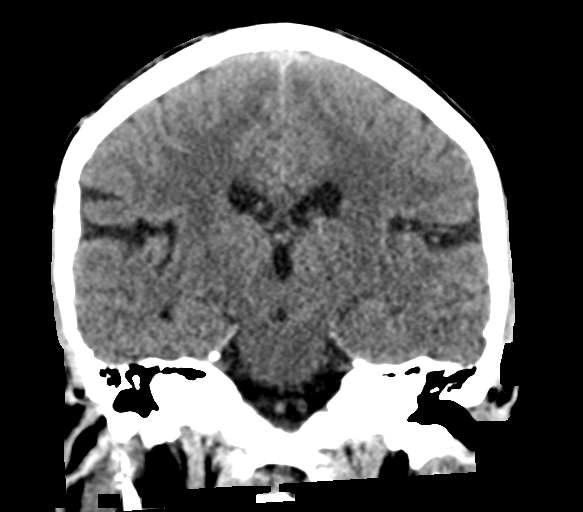
[im 34/62  brain]
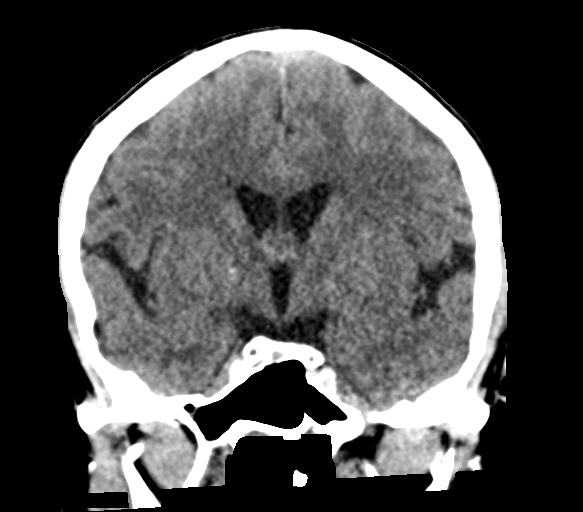

[Series 7: head without sag · sagittal · non-contrast · 0.33mm/px · 3 of 47 slices shown]
[im 16/47  brain]
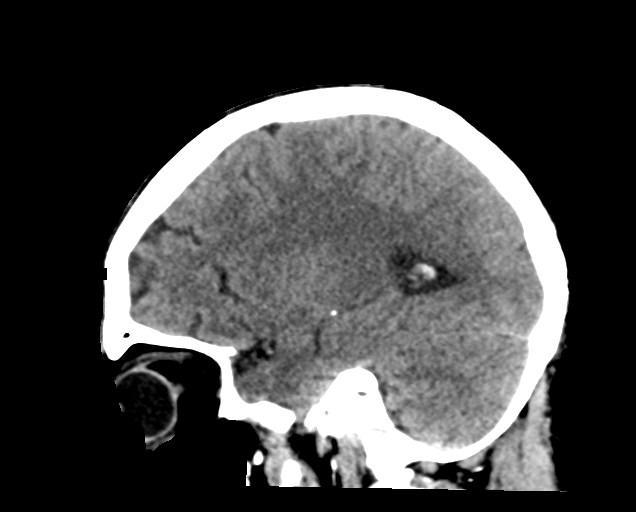
[im 24/47  brain]
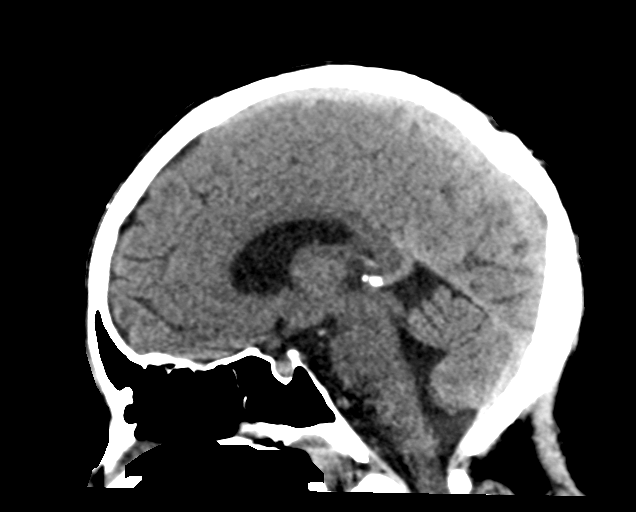
[im 31/47  brain]
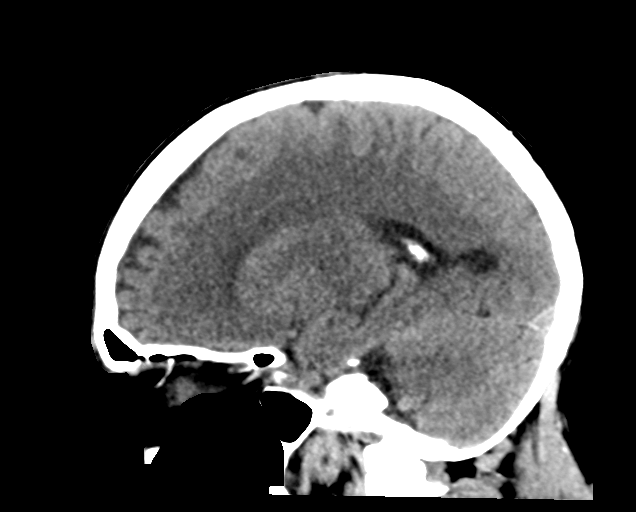

[16 of 47 positions shown; findings below may reference images not displayed]

FINDINGS: Brain: Generalized atrophy. Normal ventricular morphology. No
midline shift or mass effect. Old lacunar infarct RIGHT pons. Old
infarct medial LEFT cerebellar hemisphere. No intracranial
hemorrhage, mass lesion, or evidence of acute infarction. No
extra-axial fluid collections.

Vascular: Atherosclerotic calcification of internal carotid and
vertebral arteries at skull base

Skull: Intact

Sinuses/Orbits: Clear. Nasogastric tube traverses LEFT nasal
passages.

Other: N/A
IMPRESSION: Generalized atrophy with old infarcts RIGHT pons and LEFT
cerebellum.

No acute intracranial abnormalities.

## 2021-11-07 IMAGING — DX DG CHEST 1V PORT
1 series · 1 of 1 positions shown · non-contrast
Comparison: [DATE] at [7C] hours

CLINICAL DATA: NG tube placement

EXAM:
PORTABLE CHEST 1 VIEW

[chest]
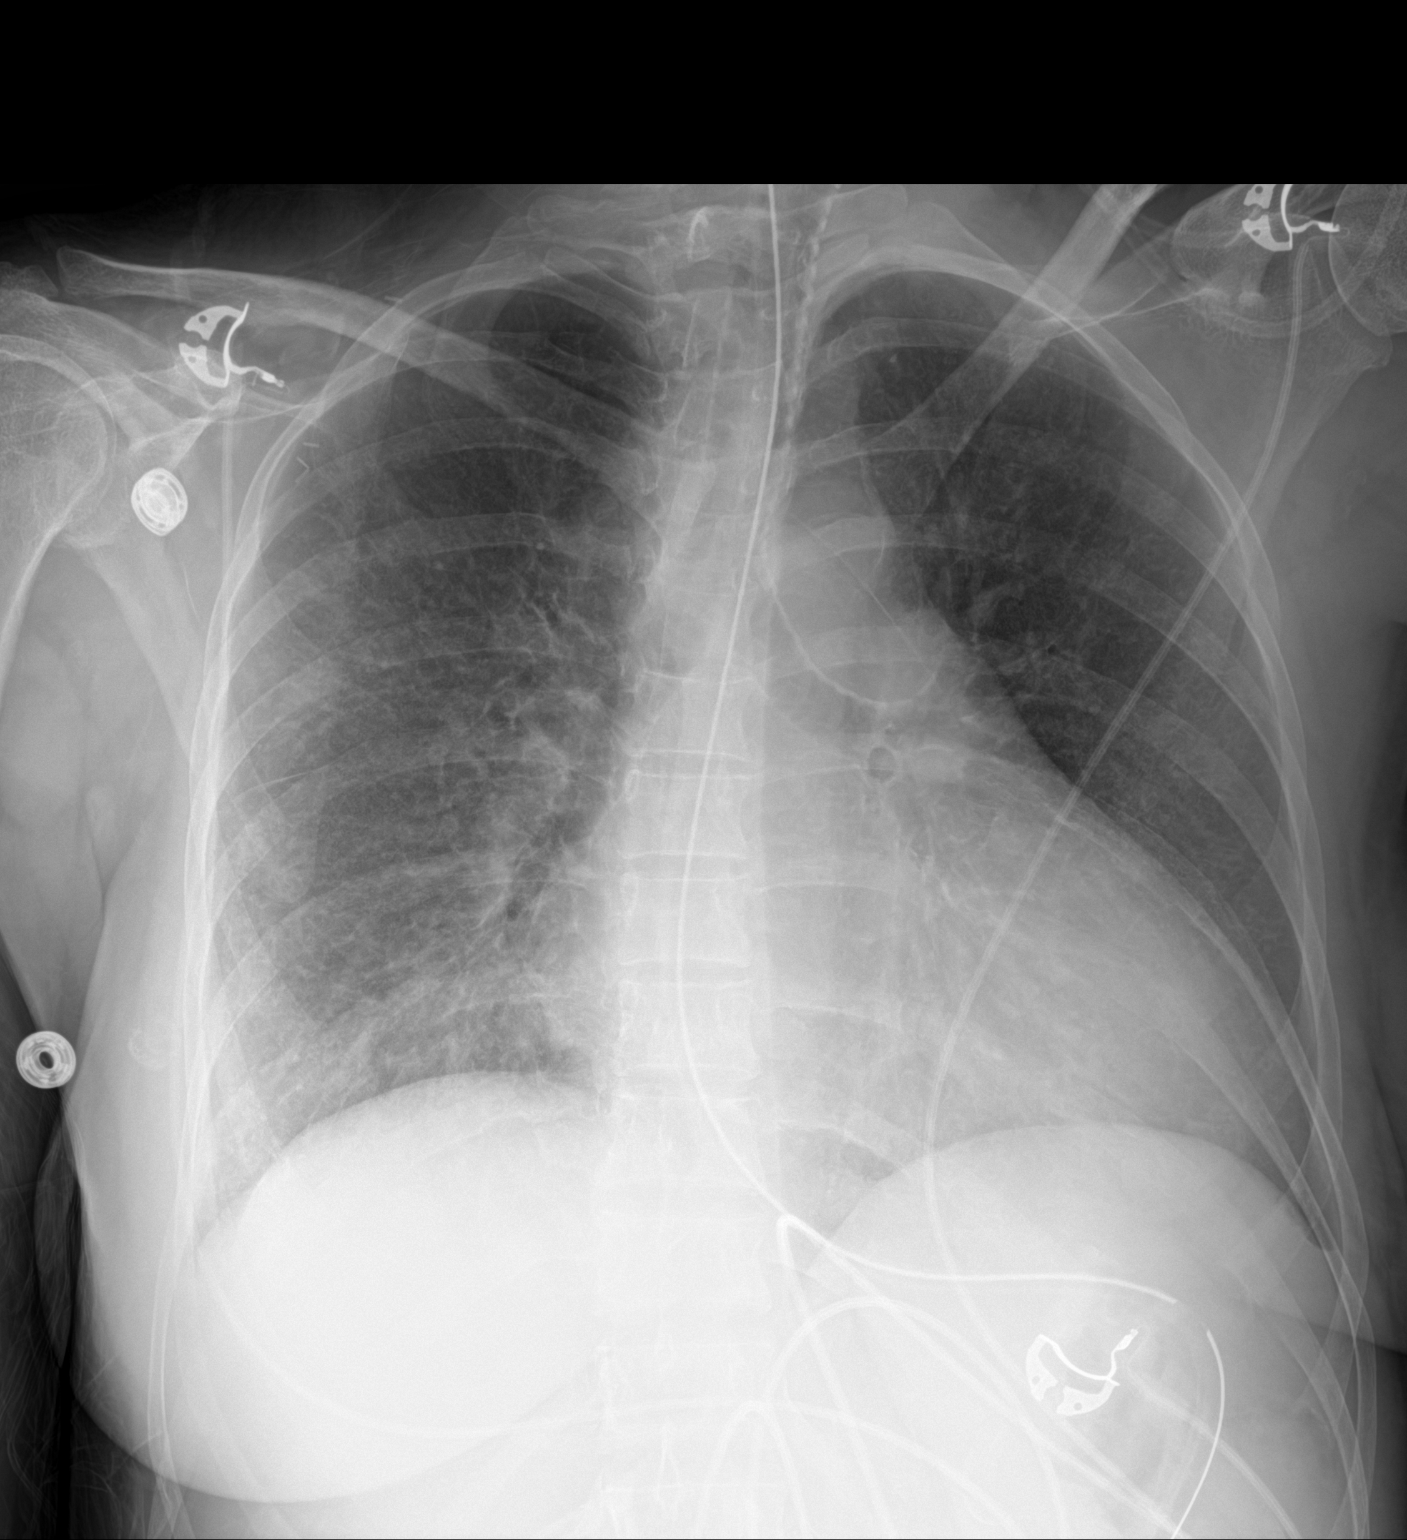

[1 of 1 positions shown; findings below may reference images not displayed]

FINDINGS: Interval placement of NG tube extending into the stomach. Stable
cardiomegaly. Diffuse bilateral interstitial opacities, unchanged.
No pleural effusion or pneumothorax.
IMPRESSION: 1. Interval placement of NG tube extending into the stomach.
2. Unchanged diffuse bilateral interstitial opacities, edema versus
multifocal infection.

## 2021-11-07 MED ORDER — METHYLPREDNISOLONE SODIUM SUCC 125 MG IJ SOLR
60.0000 mg | INTRAMUSCULAR | Status: AC
  Administered 2021-11-07 – 2021-11-09 (×3): 60 mg via INTRAVENOUS
  Filled 2021-11-07 (×3): qty 2

## 2021-11-07 MED ORDER — LIDOCAINE-PRILOCAINE 2.5-2.5 % EX CREA: 1.0000 "application " | TOPICAL_CREAM | CUTANEOUS | Status: DC | PRN

## 2021-11-07 MED ORDER — ACETAMINOPHEN 650 MG RE SUPP: 650.0000 mg | Freq: Four times a day (QID) | RECTAL | Status: DC | PRN

## 2021-11-07 MED ORDER — PREDNISONE 10 MG PO TABS
15.0000 mg | ORAL_TABLET | Freq: Every day | ORAL | Status: DC
  Administered 2021-11-07: 15 mg
  Filled 2021-11-07: qty 1

## 2021-11-07 MED ORDER — CYCLOSPORINE MODIFIED (NEORAL) 100 MG/ML PO SOLN
225.0000 mg | Freq: Every day | ORAL | Status: DC
  Administered 2021-11-07: 225 mg
  Filled 2021-11-07 (×2): qty 2.3

## 2021-11-07 MED ORDER — ZINC SULFATE 220 (50 ZN) MG PO CAPS
220.0000 mg | ORAL_CAPSULE | Freq: Every day | ORAL | Status: DC
  Administered 2021-11-07 – 2021-11-09 (×3): 220 mg
  Filled 2021-11-07 (×3): qty 1

## 2021-11-07 MED ORDER — LABETALOL HCL 200 MG PO TABS
600.0000 mg | ORAL_TABLET | Freq: Two times a day (BID) | ORAL | Status: DC
  Administered 2021-11-07 (×2): 600 mg
  Filled 2021-11-07 (×2): qty 3

## 2021-11-07 MED ORDER — DIPHENHYDRAMINE HCL 12.5 MG/5ML PO ELIX
25.0000 mg | ORAL_SOLUTION | Freq: Four times a day (QID) | ORAL | Status: DC | PRN
  Filled 2021-11-07: qty 10

## 2021-11-07 MED ORDER — COLCHICINE 0.6 MG PO TABS
0.6000 mg | ORAL_TABLET | Freq: Three times a day (TID) | ORAL | Status: DC | PRN
  Administered 2021-11-07: 0.6 mg
  Filled 2021-11-07 (×2): qty 1

## 2021-11-07 MED ORDER — ONDANSETRON HCL 4 MG PO TABS
4.0000 mg | ORAL_TABLET | Freq: Four times a day (QID) | ORAL | Status: DC | PRN
Start: 2021-11-07 — End: 2021-11-10

## 2021-11-07 MED ORDER — VANCOMYCIN HCL 500 MG/100ML IV SOLN
500.0000 mg | INTRAVENOUS | Status: DC
  Filled 2021-11-07: qty 100

## 2021-11-07 MED ORDER — SODIUM CHLORIDE 0.9 % IV SOLN
2.0000 g | INTRAVENOUS | Status: DC
  Administered 2021-11-07: 2 g via INTRAVENOUS
  Filled 2021-11-07 (×2): qty 2000

## 2021-11-07 MED ORDER — METOCLOPRAMIDE HCL 5 MG PO TABS: 5.0000 mg | ORAL_TABLET | Freq: Four times a day (QID) | ORAL | Status: DC | PRN

## 2021-11-07 MED ORDER — DEXTROSE 5 % IV SOLN
260.0000 mg | INTRAVENOUS | Status: DC
  Administered 2021-11-07: 260 mg via INTRAVENOUS
  Filled 2021-11-07 (×2): qty 5.2

## 2021-11-07 MED ORDER — MYCOPHENOLATE 200 MG/ML ORAL SUSPENSION
500.0000 mg | Freq: Two times a day (BID) | ORAL | Status: DC
  Administered 2021-11-07 (×2): 500 mg
  Filled 2021-11-07 (×4): qty 10

## 2021-11-07 MED ORDER — ASCORBIC ACID 500 MG PO TABS
500.0000 mg | ORAL_TABLET | Freq: Every day | ORAL | Status: DC
  Administered 2021-11-08 – 2021-11-09 (×2): 500 mg
  Filled 2021-11-07 (×2): qty 1

## 2021-11-07 MED ORDER — CYCLOSPORINE MODIFIED (NEORAL) 100 MG/ML PO SOLN
200.0000 mg | Freq: Every day | ORAL | Status: DC
  Administered 2021-11-07: 200 mg
  Filled 2021-11-07 (×2): qty 2

## 2021-11-07 MED ORDER — SODIUM ZIRCONIUM CYCLOSILICATE 10 G PO PACK
10.0000 g | PACK | Freq: Two times a day (BID) | ORAL | Status: DC
  Administered 2021-11-07: 10 g via ORAL
  Filled 2021-11-07: qty 1

## 2021-11-07 MED ORDER — LORAZEPAM 2 MG/ML IJ SOLN
2.0000 mg | INTRAMUSCULAR | Status: AC
  Administered 2021-11-07: 2 mg via INTRAVENOUS
  Filled 2021-11-07: qty 1

## 2021-11-07 MED ORDER — DEXTROSE-NACL 5-0.9 % IV SOLN: INTRAVENOUS | Status: DC

## 2021-11-07 MED ORDER — LACTULOSE 10 GM/15ML PO SOLN
30.0000 g | Freq: Three times a day (TID) | ORAL | Status: DC
  Administered 2021-11-07 – 2021-11-09 (×6): 30 g
  Filled 2021-11-07 (×7): qty 45

## 2021-11-07 MED ORDER — ACETAMINOPHEN 325 MG PO TABS: 650.0000 mg | ORAL_TABLET | Freq: Four times a day (QID) | ORAL | Status: DC | PRN

## 2021-11-07 MED ORDER — VANCOMYCIN HCL IN DEXTROSE 1-5 GM/200ML-% IV SOLN
1000.0000 mg | Freq: Once | INTRAVENOUS | Status: AC
  Administered 2021-11-07: 1000 mg via INTRAVENOUS
  Filled 2021-11-07: qty 200

## 2021-11-07 MED ORDER — PENTAFLUOROPROP-TETRAFLUOROETH EX AERO: 1.0000 "application " | INHALATION_SPRAY | CUTANEOUS | Status: DC | PRN

## 2021-11-07 MED ORDER — SODIUM CHLORIDE 0.9 % IV SOLN: 100.0000 mL | INTRAVENOUS | Status: DC | PRN

## 2021-11-07 MED ORDER — LACTULOSE ENEMA
300.0000 mL | Freq: Two times a day (BID) | ORAL | Status: DC
  Filled 2021-11-07: qty 300

## 2021-11-07 MED ORDER — SODIUM CHLORIDE 0.9 % IV SOLN
1.0000 g | Freq: Every day | INTRAVENOUS | Status: DC
  Administered 2021-11-07: 1 g via INTRAVENOUS
  Filled 2021-11-07 (×2): qty 1

## 2021-11-07 MED ORDER — DOXERCALCIFEROL 4 MCG/2ML IV SOLN
6.0000 ug | INTRAVENOUS | Status: DC
  Administered 2021-11-14: 6 ug via INTRAVENOUS
  Filled 2021-11-07 (×6): qty 4

## 2021-11-07 MED ORDER — LIDOCAINE HCL (PF) 1 % IJ SOLN: 5.0000 mL | INTRAMUSCULAR | Status: DC | PRN

## 2021-11-07 MED ORDER — ASPIRIN 81 MG PO CHEW
81.0000 mg | CHEWABLE_TABLET | Freq: Every day | ORAL | Status: DC
  Administered 2021-11-07 – 2021-11-09 (×3): 81 mg
  Filled 2021-11-07 (×3): qty 1

## 2021-11-07 MED ORDER — ONDANSETRON HCL 4 MG/2ML IJ SOLN
4.0000 mg | Freq: Four times a day (QID) | INTRAMUSCULAR | Status: DC | PRN
Start: 2021-11-07 — End: 2021-11-10
  Administered 2021-11-09: 4 mg via INTRAVENOUS
  Filled 2021-11-07: qty 2

## 2021-11-07 MED ORDER — METOCLOPRAMIDE HCL 5 MG PO TABS
5.0000 mg | ORAL_TABLET | Freq: Four times a day (QID) | ORAL | Status: DC | PRN
  Filled 2021-11-07: qty 1

## 2021-11-07 MED ORDER — AMLODIPINE BESYLATE 10 MG PO TABS
10.0000 mg | ORAL_TABLET | Freq: Every day | ORAL | Status: DC
  Administered 2021-11-07: 10 mg
  Filled 2021-11-07: qty 1

## 2021-11-07 MED ORDER — HEPARIN SODIUM (PORCINE) 1000 UNIT/ML DIALYSIS: 1000.0000 [IU] | INTRAMUSCULAR | Status: DC | PRN

## 2021-11-07 MED ORDER — VANCOMYCIN HCL 500 MG/100ML IV SOLN: 500.0000 mg | INTRAVENOUS | Status: DC

## 2021-11-07 MED ORDER — HEPARIN SODIUM (PORCINE) 1000 UNIT/ML DIALYSIS: 20.0000 [IU]/kg | INTRAMUSCULAR | Status: DC | PRN

## 2021-11-07 NOTE — Assessment & Plan Note (Signed)
-??    Etiology ?-Tmax 102.9. ?-Check UA with cultures and sensitivities, check blood cultures x2, check a chest x-ray. ?-Check a CT head as patient also with acute encephalopathy. ?-Continue treatment for COVID. ?-Place empirically on IV vancomycin, IV cefepime, IV ampicillin, IV acyclovir. ?

## 2021-11-07 NOTE — Consult Note (Signed)
Neurology Consultation  Reason for Consult: Altered mental status, facial twitching, fever without clear source Referring Physician: Dr. Grandville Silos  CC: Altered mental status  History is obtained from: Chart, friend-Cameron Hanger over the phone  HPI: [name] [name] is a 41 y.o. 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  She has family in [place] and [place] but no family in the [place].  The closest friend/family is [employer] who I was able to speak with over the phone.  He reports that Thursday night she was feeling somewhat unwell.  She works at [employer].  According to the friend, the patient is usually compliant to dialysis.  She had been feeling sick on Thursday and eventually ended up coming to the hospital a day or so later.  She was noted to be very altered, confused, not making sense.  Somebody had called EMS and [place] to the hospital.  She was noted to have pinpoint pupils, concern for narcotic overdose for which IV Narcan was given with some improvement in mental status briefly but then she again became obtunded.  She tested positive for COVID-19.  Influenza PCR was negative.  Chest x-ray showed mild pulmonary edema secondary to volume overload.  She underwent dialysis and creatinine numbers improved but her mentation did not improve. She also had hyperammonemia for which she received lactulose and the ammonia level is improving although not at normal but at 46 today. Ever since last night and into this morning, nursing staff noted right facial twitching. She has also spiked a fever without a clear source although she is COVID-positive. Neurological consultation obtained for facial twitching, fever with  concern for CNS infection as well as altered mental status.   LKW: Thursday, 10/06/2021 at some point tpa given?: no, outside the window Premorbid modified Rankin scale (mRS): 0  ROS: Unable to obtain due to altered mental status.   Past Medical History:  Diagnosis Date   Anemia    Anxiety    ESRD (end stage renal disease) ([place])    Henmodialysis   Gastroparesis    Hypertension    Narcotic abuse ([place])    Non compliance with medical treatment    Renal disorder 2015   transplant   Vision loss     Family History  Problem Relation Age of Onset   Hypertension Father     Social History:   reports that she has quit smoking. Her smoking use included pipe. She has never used smokeless tobacco. She reports that she does not drink alcohol and does not use drugs.  Medications  Current Facility-Administered Medications:    0.9 %  sodium chloride infusion, 100 mL, Intravenous, PRN, Joelyn Oms, Ryan B, MD   0.9 %  sodium chloride infusion, 100 mL, Intravenous, PRN, Pearson Grippe B, MD   acetaminophen (TYLENOL) tablet 650 mg, 650 mg, Per Tube, Q6H PRN **OR** acetaminophen (TYLENOL) suppository 650 mg, 650 mg, Rectal, Q6H PRN, Donnamae Jude, RPH   acyclovir (ZOVIRAX) 260 mg in dextrose 5 % 100 mL IVPB, 260 mg, Intravenous, Q24H, Eugenie Filler, MD, Last Rate: 105.2 mL/hr at 11/07/21 1247, 260 mg at 11/07/21 1247   amLODipine (NORVASC) tablet 10 mg, 10 mg, Per Tube, QHS, Benetta Spar D, RPH   ampicillin (OMNIPEN) 2 g in sodium chloride 0.9 %  100 mL IVPB, 2 g, Intravenous, Q24H, Eugenie Filler, MD, Last Rate: 300 mL/hr at 11/07/21 1243, 2 g at 11/07/21 1243   [START ON 11/08/2021] ascorbic acid (VITAMIN C) tablet 500 mg, 500 mg, Per Tube, Daily, Donnamae Jude, RPH   aspirin chewable tablet 81 mg, 81 mg, Per Tube, Daily, Donnamae Jude, RPH   ceFEPIme (MAXIPIME) 1 g in sodium chloride 0.9 % 100 mL IVPB, 1 g, Intravenous, QHS, Donnamae Jude, RPH   Chlorhexidine Gluconate Cloth 2 % PADS 6 each, 6  each, Topical, Daily, Mansy, Jan A, MD, 6 each at 11/07/21 0174   cholecalciferol (VITAMIN D3) tablet 1,000 Units, 1,000 Units, Oral, Daily, Mansy, Jan A, MD, 1,000 Units at 11/06/21 0800   cloNIDine (CATAPRES - Dosed in mg/24 hr) patch 0.1 mg, 0.1 mg, Transdermal, Q Sun, Mansy, Jan A, MD, 0.1 mg at 11/06/21 9449   colchicine tablet 0.6 mg, 0.6 mg, Per Tube, TID PRN, Donnamae Jude, RPH   cycloSPORINE (NEORAL) 100 MG/ML microemulsion solution 200 mg, 200 mg, Per Tube, Daily, Donnamae Jude, RPH   cycloSPORINE (NEORAL) 100 MG/ML microemulsion solution 225 mg, 225 mg, Per Tube, QHS, Chen, Lydia D, RPH   dextrose 5 %-0.9 % sodium chloride infusion, , Intravenous, Continuous, Eugenie Filler, MD   diphenhydrAMINE (BENADRYL) 12.5 MG/5ML elixir 25 mg, 25 mg, Per Tube, Q6H PRN, Donnamae Jude, RPH   ferric citrate (AURYXIA) tablet 630 mg, 630 mg, Oral, TID WC, Mansy, Jan A, MD   heparin injection 1,000 Units, 1,000 Units, Dialysis, PRN, Rexene Agent, MD   heparin injection 1,200 Units, 20 Units/kg, Dialysis, PRN, Rexene Agent, MD   hydrALAZINE (APRESOLINE) injection 10 mg, 10 mg, Intravenous, Q6H PRN, Eugenie Filler, MD   labetalol (NORMODYNE) tablet 600 mg, 600 mg, Per Tube, BID, Donnamae Jude, RPH   lactulose (CHRONULAC) 10 GM/15ML solution 30 g, 30 g, Per Tube, TID, Eugenie Filler, MD   lidocaine (PF) (XYLOCAINE) 1 % injection 5 mL, 5 mL, Intradermal, PRN, Pearson Grippe B, MD   lidocaine-prilocaine (EMLA) cream 1 application, 1 application., Topical, PRN, Rexene Agent, MD   magnesium hydroxide (MILK OF MAGNESIA) suspension 30 mL, 30 mL, Oral, Daily PRN, Mansy, Jan A, MD   metoCLOPramide (REGLAN) tablet 5 mg, 5 mg, Per Tube, Q6H PRN, Donnamae Jude, RPH   molnupiravir EUA (LAGEVRIO) capsule 800 mg, 4 capsule, Oral, BID, Mansy, Jan A, MD, 800 mg at 11/06/21 0802   mycophenolate (CELLCEPT) oral suspension 50 mg/mL, 500 mg, Per Tube, BID, Benetta Spar D, RPH   naloxone [facility]) injection  0.4 mg, 0.4 mg, Intravenous, PRN, Kommor, [place], MD, 0.4 mg at 11/06/21 1011   ondansetron (ZOFRAN) tablet 4 mg, 4 mg, Per Tube, Q6H PRN **OR** ondansetron (ZOFRAN) injection 4 mg, 4 mg, Intravenous, Q6H PRN, Benetta Spar D, RPH   pentafluoroprop-tetrafluoroeth (GEBAUERS) aerosol 1 application, 1 application., Topical, PRN, Rexene Agent, MD   predniSONE (DELTASONE) tablet 15 mg, 15 mg, Per Tube, Daily, Donnamae Jude, [facility]   [START ON 11/09/2021] vancomycin (VANCOREADY) IVPB 500 mg/100 mL, 500 mg, Intravenous, Q M,W,F-HD, Donnamae Jude, RPH   zinc sulfate capsule 220 mg, 220 mg, Per Tube, Daily, Donnamae Jude, [facility]   Exam: Current vital signs: BP 125/67 (BP Location: Left Arm)    Pulse (!) 111    Temp (!) 101.3 F (38.5 C)    Resp (!) 29    Ht '5\' 4"'$  (1.626 m)  Wt 53.4 kg    LMP  (LMP Unknown) Comment: pt ams   SpO2 96%    BMI 20.21 kg/m  Vital signs in last 24 hours: Temp:  [99.5 F (37.5 C)-102.9 F (39.4 C)] 101.3 F (38.5 C) (03/06 1100) Pulse Rate:  [77-113] 111 (03/06 1100) Resp:  [17-37] 29 (03/06 1100) BP: (83-212)/(62-156) 125/67 (03/06 1100) SpO2:  [88 %-100 %] 96 % (03/06 1100) Weight:  [53.4 kg-56.5 kg] 53.4 kg (03/06 0700) General: Drowsy, opens eyes to voice.  Does not follow commands. HEENT: Normocephalic/atraumatic's, supple neck CVs: Regular rhythm Respiratory: On supplemental oxygen, scattered rales Abdomen nondistended nontender Neurological exam She is very drowsy, opens eyes to voice and minimal noxious stimulation. Mumbles some words but does not follow commands. Cranial nerves: Pupils are equal round react light, extraocular movement examination is difficult but she does not have a gaze preference or deviation, face appears symmetric although I did appreciate some off-and-on facial twitching during my examination. Motor examination: No spontaneous movement to noxious stimulation minimal withdrawal in all 4 extremities that appears almost symmetric. Sensation:  As above Coordination and gait cannot be assessed given her mentation. 1a Level of Conscious.:1  1b LOC Questions: 2 1c LOC Commands: 2 2 Best Gaze: 0 3 Visual: 0 4 Facial Palsy: 0 5a Motor Arm - left: 2 5b Motor Arm - Right: 2 6a Motor Leg - Left: 2 6b Motor Leg - Right: 2 7 Limb Ataxia: 0 8 Sensory: 0 9 Best Language: 2 10 Dysarthria: 2 11 Extinct. and Inatten.: 0 TOTAL: 17   Labs I have reviewed labs in epic and the results pertinent to this consultation are:  CBC    Component Value Date/Time   WBC 4.2 11/07/2021 0137   RBC 3.87 11/07/2021 0137   HGB 11.8 (L) 11/07/2021 0137   HCT 36.3 11/07/2021 0137   PLT 64 (L) 11/07/2021 0137   MCV 93.8 11/07/2021 0137   MCH 30.5 11/07/2021 0137   MCHC 32.5 11/07/2021 0137   RDW 12.7 11/07/2021 0137   LYMPHSABS 0.5 (L) 11/07/2021 0137   MONOABS 0.2 11/07/2021 0137   EOSABS 0.0 11/07/2021 0137   BASOSABS 0.0 11/07/2021 0137    CMP     Component Value Date/Time   NA 138 11/07/2021 0519   K 3.9 11/07/2021 0519   CL 95 (L) 11/07/2021 0519   CO2 25 11/07/2021 0519   GLUCOSE 85 11/07/2021 0519   BUN 42 (H) 11/07/2021 0519   CREATININE 6.37 (H) 11/07/2021 0519   CALCIUM 8.9 11/07/2021 0519   PROT 8.2 (H) 11/07/2021 0519   ALBUMIN 4.0 11/07/2021 0519   AST 28 11/07/2021 0519   ALT 16 11/07/2021 0519   ALKPHOS 73 11/07/2021 0519   BILITOT 0.4 11/07/2021 0519   GFRNONAA 8 (L) 11/07/2021 0519   GFRAA 3 (L) 05/14/2020 0223   Ammonia level in the 90s down to the 40s.  Imaging I have reviewed the images obtained:  CT-head-11/06/2020-no acute changes.  Assessment:  41 year old who has had a few days worth of feeling unwell and was brought into the hospital for altered mental status.  Unclear if she was compliant to her dialysis as she has a history of noncompliance to medications. She has received dialysis and renal function seems to be improving.  Also was noted to have hyperammonemia which is seemingly improving.   Mentation remains off of baseline. Initial concern for possible narcotic overdose, some improvement in mentation initially with Narcan but now off of any opiates remains  encephalopathic. COVID-positive with pulmonary edema-question if she has COVID encephalopathy or infection related toxic metabolic encephalopathy. Facial twitching has been observed-uremia versus hyperammonemia versus seizure. Needs further work-up  Recommendations: EEG-if concerning for seizures, will hookup to continuous EEG. MRI of the brain w+without contrast If MRI is delayed, at least attempt to get a CT head-to rule out acute stroke or bleed. I am not going to recommend starting antiepileptics unless EEG shows any abnormality. I also would hold off on LP for now-she has multiple reasons to be altered and has no meningismus or meningitic signs.  Continue antibiotics/antiviral coverage for meningitis/encephalitis given that she has been on it for a couple of days.  Yield of LP will be low at this time anywhere except for HSV PCR. Plan discussed with Dr. Tamala Julian from [facility].  -- Amie Portland, MD Neurologist [name] Pager: [phone]

## 2021-11-07 NOTE — Progress Notes (Signed)
[name] [name] [id number] ?Admission Data: 11/07/2021 5:23 PM ?Attending Provider: Candee Furbish, MD  ?[id number], No Pcp Per (Inactive) ?Consults/ Treatment Team: Treatment Team:  ?Roney Jaffe, MD ? ?[name] [name] is a 41 y.o. female patient transferred from [address] to [facility] ICU room 13,  awake, alert  & orientated  X 1,  Full Code, VSS - Blood pressure (!) 113/42, pulse 87, temperature 98.3 ?F (36.8 ?C), temperature source Oral, resp. rate (!) 21, height '5\' 4"'$  (1.626 m), weight 53.4 kg, SpO2 97 %., O2  2L nasal cannular, no c/o shortness of breath, no c/o chest pain, no distress noted. Tele # [id number] and pt is currently running:normal sinus rhythm. ? ? IV site WDL:  forearm left, condition patent and no redness with a transparent dsg that's clean dry and intact. ? ? ?Will cont to monitor and assist as needed. ? ?Hosie Spangle, RN ?11/07/2021 5:23 PM  ?

## 2021-11-07 NOTE — Progress Notes (Signed)
vLTM started  all impedances below 10kohms   [facility] to monitor   Patient event button tested 

## 2021-11-07 NOTE — Progress Notes (Signed)
EEG concerning for generalized periodic epileptiform discharges at 2.5 Hz-could just be toxic metabolic.  Would benefit from an Ativan challenge which have ordered. ?Would leave her on LTM EEG ?We will continue to follow ?Discussed with Dr. Tamala Julian ? ?-- ?Amie Portland, MD ?Neurologist ?[name] ?Pager: [phone] ? ?

## 2021-11-07 NOTE — Progress Notes (Addendum)
Pharmacy Antibiotic Note ? ?[name] [name] is a 41 y.o. female admitted on 11/05/2021 with fever and altered mental status in the setting of COVID + and immunosuppression for hx of renal and pancreatic transplant.  Pharmacy has been consulted for cefepime, vancomycin, ampicillin and acyclovir dosing for suspected meningitis.  Patient is on dialysis MWF.  ? ?Tmax 102.9. CXR without infection. COVID positive.  ? ?Plan: ?Vancomycin 1g x1 then '500mg'$  post HD MWF ?Cefepime 1g Q24 hr  ?Ampicillin 2g q24 hr  ?Acyclovir '5mg'$ /kg (= '260mg'$ ) Q24hr ?MIVF per team given dialysis patient  ?Monitor cultures, clinical status, renal function, vancomycin level ?Narrow abx as able and f/u duration  ? ? ? ?Height: '5\' 4"'$  (162.6 cm) ?Weight: 53.4 kg (117 lb 11.6 oz) ?IBW/kg (Calculated) : 54.7 ? ?Temp (24hrs), Avg:100.7 ?F (38.2 ?C), Min:98.5 ?F (36.9 ?C), Max:102.9 ?F (39.4 ?C) ? ?Recent Labs  ?Lab 11/05/21 ?2317 11/05/21 ?2322 11/06/21 ?0441 11/07/21 ?0137  ?WBC 3.1*  --  3.4* 4.2  ?CREATININE 9.65* 10.50* 10.30* 11.97*  ?  ?Estimated Creatinine Clearance: 5.3 mL/min (A) (by C-G formula based on SCr of 11.97 mg/dL (H)).   ? ?Allergies  ?Allergen Reactions  ? Iodinated Contrast Media   ?  Other reaction(s): Unknown  ? ? ?Antimicrobials this admission: ?Cefe 3/6 >>  ?Vanc 3/6 >>  ?Amp 3/6 >> ?Acyclovir 3/6 >>  ?  ? ?Microbiology results: ?3/6 BCx: pending ?3/6 Ucx: pending ? ?Thank you for allowing pharmacy to be a part of this patient?s care. ? ?Benetta Spar, PharmD, BCPS, BCCP ?Clinical Pharmacist ? ?Please check AMION for all [facility] phone numbers ?After 10:00 PM, call [facility] [phone] ? ?

## 2021-11-07 NOTE — Consult Note (Signed)
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  Hospital course complicated by progressive obtundation prompting ICU evaluation.  Patient currently unable to provide any history.  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  Pancreas graft still apparently working. -History of narcotic abuse -Questionable compliance with rejection meds   Significant Hospital Events: Including procedures, antibiotic start and stop dates in addition to other pertinent events   3/5 admitted 3/6 Neuro consult, ICU transfer  Interim History / Subjective:  Nearly comatose.  Objective   Blood pressure 125/67, pulse (!) 111, temperature (!) 101.3 F (38.5 C), resp. rate (!) 29, height '5\' 4"'$  (1.626 m), weight 53.4 kg, SpO2 96 %.        Intake/Output Summary (Last 24 hours) at 11/07/2021 1407 Last data filed at 11/07/2021 7741 Gross per 24 hour  Intake 0 ml  Output 3161 ml  Net -3161 ml   Filed Weights   11/06/21 0221 11/07/21 0300 11/07/21 0700  Weight: 61.2 kg 56.5 kg 53.4 kg    Examination: General: ill appearing woman laying in bed HENT: MM dry, trachea midline Lungs: Mildly tachypneic, ext warm Cardiovascular: tachycardic, warm ext Abdomen: soft, +BS Extremities: trace edema Neuro: withdraws x 4, moans to stimulus, says name Skin: Looks jaundiced but bili is normal  Chemistry benign Ammonia 98>>42  Resolved Hospital Problem list   N/A  Assessment & Plan:  Acute encephalopathy- ?sepsis, ?hepatic, ?COVID induced.  On meningitis coverage. Immunocompromised COVID  pneumonitis ESRD on HD S/P pancreas transplant Gastroparesis Anemia Anxiety  -Bring to ICU for worsening respiratory and mental status. Low threshold for intubation but protecting airway at present, ABG benign. -cEEG, AEDs and benzodiazepines per neuro -Eventual MRI - Lactulose by NGT -Probably needs LP -Continue PTA immunosuppressants, check AM cyclosporin level - Change prednisone to solumedrol '1mg'$ /kg x 10 days - Continue molnupiravir  Best Practice (right click and "Reselect all SmartList Selections" daily)   Diet/type: NPO DVT prophylaxis: SCD GI prophylaxis: N/A Lines: N/A Foley:  N/A Code Status:  full code Last date of multidisciplinary goals of care discussion [Friend is reaching out to brother to get [place] his number so we can update]  Labs   CBC: Recent Labs  Lab 11/05/21 2317 11/05/21 2322 11/06/21 0441 11/07/21 0137  WBC 3.1*  --  3.4* 4.2  NEUTROABS 2.3  --   --  3.4  HGB 12.5 13.3 11.9* 11.8*  HCT 38.4 39.0 36.5 36.3  MCV 94.1  --  93.6 93.8  PLT 79*  --  75* 64*    Basic Metabolic Panel: Recent Labs  Lab 11/05/21 2317 11/05/21 2322 11/06/21 0441 11/07/21 0137 11/07/21 0519  NA 139 137 140 138 138  K 5.9* 5.8* 5.5* >7.5* 3.9  CL 91* 96* 95* 96* 95*  CO2 28  --  '27 22 25  '$ GLUCOSE 79 72 74 75 85  BUN 65* 62* 72* 100* 42*  CREATININE 9.65* 10.50* 10.30* 11.97* 6.37*  CALCIUM 8.6*  --  8.3* 7.5* 8.9  PHOS  --   --   --  8.7*  --    GFR: Estimated Creatinine Clearance:  10 mL/min (A) (by C-G formula based on SCr of 6.37 mg/dL (H)). Recent Labs  Lab 11/05/21 2317 11/06/21 0441 11/07/21 0137  PROCALCITON  --  1.28  --   WBC 3.1* 3.4* 4.2    Liver Function Tests: Recent Labs  Lab 11/05/21 2317 11/07/21 0137 11/07/21 0519  AST 27  --  28  ALT 15  --  16  ALKPHOS 71  --  73  BILITOT 0.8  --  0.4  PROT 6.6  --  8.2*  ALBUMIN 3.4* 3.1* 4.0   No results for input(s): LIPASE, AMYLASE in the last 168 hours. Recent Labs  Lab  11/06/21 2131 11/07/21 0953  AMMONIA 98* 42*    ABG    Component Value Date/Time   PHART 7.42 11/07/2021 1235   PCO2ART 43 11/07/2021 1235   PO2ART 90 11/07/2021 1235   HCO3 27.2 11/07/2021 1235   TCO2 31 11/05/2021 2322   ACIDBASEDEF 5.2 (H) 01/13/2015 0300   O2SAT 95.7 11/07/2021 1235     Coagulation Profile: No results for input(s): INR, PROTIME in the last 168 hours.  Cardiac Enzymes: No results for input(s): CKTOTAL, CKMB, CKMBINDEX, TROPONINI in the last 168 hours.  HbA1C: Hgb A1c MFr Bld  Date/Time Value Ref Range Status  11/06/2021 12:15 PM 3.9 (L) 4.8 - 5.6 % Final    Comment:    (NOTE) Pre diabetes:          5.7%-6.4%  Diabetes:              >6.4%  Glycemic control for   <7.0% adults with diabetes   12/20/2018 11:23 PM 4.6 (L) 4.8 - 5.6 % Final    Comment:    (NOTE) Pre diabetes:          5.7%-6.4% Diabetes:              >6.4% Glycemic control for   <7.0% adults with diabetes     CBG: Recent Labs  Lab 11/06/21 2029 11/06/21 2340 11/07/21 0320 11/07/21 0753 11/07/21 1145  GLUCAP 73 82 92 88 100*    Review of Systems:   Encephalopathic  Past Medical History:  She,  has a past medical history of Anemia, Anxiety, ESRD (end stage renal disease) ([place]), Gastroparesis, Hypertension, Narcotic abuse ([place]), Non compliance with medical treatment, Renal disorder (2015), and Vision loss.   Surgical History:   Past Surgical History:  Procedure Laterality Date   A/V FISTULAGRAM Right 02/17/2021   Procedure: A/V FISTULAGRAM;  Surgeon: [name], MD;  Location: [facility];  Service: Cardiovascular;  Laterality: Right;   AV FISTULA PLACEMENT  11/17/2011   Procedure: ARTERIOVENOUS (AV) FISTULA CREATION;  Surgeon: Rosetta Posner, MD;  Location: [facility];  Service: Vascular;  Laterality: Right;   EYE SURGERY     HEMATOMA EVACUATION Right 02/25/2021   Procedure: EVACUATION HEMATOMA RIGHT CHEST;  Surgeon: Waynetta Sandy, MD;  Location:  [facility];  Service: Vascular;  Laterality: Right;   INSERTION OF DIALYSIS CATHETER Right 12/08/2020   Procedure: INSERTION OF TUNNELED DIALYSIS CATHETER;  Surgeon: [name], MD;  Location: [facility];  Service: Vascular;  Laterality: Right;   IR REMOVAL TUN CV CATH W/O FL  05/03/2021   KIDNEY TRANSPLANT     NEPHRECTOMY TRANSPLANTED ORGAN     pancrease transplant     PERIPHERAL VASCULAR BALLOON ANGIOPLASTY Right 02/17/2021   Procedure: PERIPHERAL VASCULAR BALLOON ANGIOPLASTY;  Surgeon: [name], MD;  Location: [facility];  Service: Cardiovascular;  Laterality: Right;   REFRACTIVE SURGERY     REVISON OF ARTERIOVENOUS FISTULA Right 12/08/2020   Procedure: RIGHT ARM ARTERIOVENOUS FISTULA REVISION AND RESECTION;  Surgeon: [name], MD;  Location: [facility];  Service: Vascular;  Laterality: Right;   REVISON OF ARTERIOVENOUS FISTULA Right 02/25/2021   Procedure: RIGHT ARM ARTERIOVENOUS FISTULA REVISION WITH TRANSPOSITION OF CEPHALIC VEIN ON AXILLARY VEIN;  Surgeon: [name], MD;  Location: [facility];  Service: Vascular;  Laterality: Right;     Social History:   reports that she has quit smoking. Her smoking use included pipe. She has never used smokeless tobacco. She reports that she does not drink alcohol and does not use drugs.   Family History:  Her family history includes Hypertension in her father.   Allergies Allergies  Allergen Reactions   Iodinated Contrast Media     Other reaction(s): Unknown     Home Medications  Prior to Admission medications   Medication Sig Start Date End Date Taking? Authorizing Provider  amLODipine (NORVASC) 10 MG tablet Take 10 mg by mouth at bedtime. 09/19/18  Yes [provider]  CATAPRES-TTS-1 0.1 MG/24HR patch Place 0.1 mg onto the skin once a week. 09/26/21  Yes [provider]  ferric citrate (AURYXIA) 1 GM 210 MG(Fe) tablet Take 630 mg by mouth 3 (three) times daily with meals.   Yes [provider]  furosemide (LASIX) 20 MG tablet Take 40 mg by mouth 4 (four) times a week. Take 40 mg on Tues, Thurs, Sat, and Sun (non-dialysis days) 09/12/19  Yes [provider]  gabapentin (NEURONTIN) 100 MG capsule Take 100 mg by mouth 2 (two) times daily. 10/12/21  Yes [provider]  labetalol (NORMODYNE) 200 MG tablet Take 600 mg by mouth 2 (two) times daily. 09/12/19  Yes [provider]  LOKELMA 10 g PACK packet Take 10 g by mouth See admin instructions. Non dialysis days [place]) 10/13/21  Yes [provider]  losartan (COZAAR) 100 MG tablet Take 100 mg by mouth at bedtime. 02/02/21  Yes [provider]  metoCLOPramide (REGLAN) 5 MG tablet Take 5 mg by mouth every 6 (six) hours as needed for nausea or vomiting. 09/03/21  Yes [provider]  molnupiravir EUA (LAGEVRIO) 200 mg CAPS capsule Take 4 capsules by mouth 2 (two) times daily.   Yes [provider]  mycophenolate (MYFORTIC) 180 MG EC tablet Take 540 mg by mouth 2 (two) times daily.   Yes [provider]  oxyCODONE-acetaminophen (PERCOCET/ROXICET) 5-325 MG tablet Take 1-2 tablets by mouth every 6 (six) hours as needed for severe pain. Patient taking differently: Take 1 tablet by mouth 3 (three) times daily as needed for moderate pain. 03/22/21  Yes [name], MD  predniSONE (DELTASONE) 5 MG tablet Take 15 mg by mouth daily. 06/24/15  Yes [provider]  zolpidem (AMBIEN) 10 MG tablet Take 10 mg by mouth at bedtime as needed for sleep.   Yes [provider]  aspirin EC 81 MG tablet Take 81 mg by mouth daily.    [provider]  colchicine 0.6 MG tablet Take 0.6 mg by mouth 3 (three) times daily as needed (gout flares). 02/28/21   [provider]  cycloSPORINE modified (NEORAL) 100 MG capsule Take 200 mg by mouth 2 (two) times daily.    [provider]  cycloSPORINE modified (NEORAL) 25 MG  capsule Take 25 mg by mouth every evening.  [provider]  diphenhydrAMINE (BENADRYL) 25 MG tablet Take 25 mg by mouth every 6 (six) hours as needed for itching.    [provider]  heparin 1000 unit/mL SOLN injection Heparin Sodium (Porcine) 1,000 Units/mL Catheter Lock Arterial 12/22/20 12/21/21  [provider]  HYDROcodone-acetaminophen (NORCO) 5-325 MG tablet Take 1 tablet by mouth every 6 (six) hours as needed for up to 12 doses for severe pain. Patient not taking: No sig reported 03/02/21   Corena Herter, PA-C  lidocaine-prilocaine (EMLA) cream Apply 1 application topically daily as needed (port access). 07/09/20   [provider]  Methoxy PEG-Epoetin Beta (MIRCERA IJ) Mircera 01/03/21 01/02/22  [provider]  naloxone [facility]) nasal spray 4 mg/0.1 mL Place 1 spray into the nose as needed (opioid overdose).    [provider]  ondansetron (ZOFRAN) 4 MG tablet Take 4 mg by mouth 2 (two) times daily as needed for nausea or vomiting.    [provider]     Critical care time: 34 minutes

## 2021-11-07 NOTE — Progress Notes (Signed)
EEG complete - results pending 

## 2021-11-07 NOTE — Progress Notes (Signed)
PROGRESS NOTE    [name] [name]  FUX:[id number] DOB: 1981/01/02 DOA: 11/05/2021 PCP: Patient, No Pcp Per (Inactive)    Chief Complaint  Patient presents with   Altered Mental Status    Brief Narrative:  Patient is a 41 year old unfortunate female history of end-stage renal disease on HD on TTS, status post renal and pancreatic transplant, complicated by medical nonadherence and rejection, hypertension, gastroparesis, anxiety, anemia and narcotic abuse presented to the ED with acute onset of altered mental status and confusion.  It was noted that 1 of patient's friends had not heard from her for the last couple of days and EMS called.  When EMS got there patient noted to be conscious but disoriented.  Unknown as to whether patient had a last hemodialysis session.  Patient seen in the ED noted to have pinpoint pupils and altered mental status noted to be somnolent and arousable.  Patient given a dose of IV Narcan which significantly improved her mental status briefly.  Patient seen in the ED COVID-19 PCR obtained was positive.  Influenza PCR negative.  EKG showed normal sinus rhythm and some peaked T waves anterior laterally.  Initial high-sensitivity troponin was 76.  Chest x-ray showed mild pulmonary edema likely secondary to volume overload.  Noncontrast head CT negative    Assessment & Plan:  Principal Problem:   Acute encephalopathy Active Problems:   Pulmonary edema   Hypertensive urgency   Type 2 diabetes mellitus with peripheral neuropathy (HCC)   Gout   History of renal transplant   Hyperkalemia   COVID-19 virus infection   Fever    Assessment and Plan: * Acute encephalopathy - This is likely multifactorial.  It could be related to COVID encephalopathy as well as uremia with end-stage renal disease on hemodialysis with question about compliance and certainly polypharmacy with opiate overdose, given response to IV Narcan.,  Concern for infectious etiology as patient to have a  Tmax of 102.9 this morning and late last night. -  Patient alert but not following commands.  Moving extremities spontaneously.   -CT head negative for any acute abnormalities. -Continue neurochecks for another 24 hours. -Continue IV Narcan as needed. -Discontinue Neurontin, trazodone. -Avoid sedatives -Continue treatment for COVID of molnupiravir, vitamin C, zinc, vitamin D. -Monitor inflammatory markers. -TSH within normal limits. -Ammonia level elevated at 98 -Nephrology consulted and patient noted to have underwent hemodialysis earlier this morning around 2 AM -Lactulose enema ordered however patient not holding onto enema per RN and as such we will place an NG tube and place on lactulose per NG tube. -Repeat chest x-ray, CT head, check UA with cultures and sensitivities, check blood cultures x2. -Place empirically on IV vancomycin, IV cefepime, IV ampicillin, IV acyclovir. -Supportive care. -If no improvement on current treatment plan may need to consider LP.  Pulmonary edema - Pulmonary edema noted on chest x-ray likely secondary to fluid overload secondary to end-stage renal disease with probable noncompliance with hemodialysis.   -Was on IV Lasix which we will discontinue as patient underwent hemodialysis earlier this morning with clear lungs on examination.   -Nephrology following.  Hypertensive urgency - Improving on current regimen of Norvasc, clonidine patch, labetalol.  -Cozaar on hold secondary to hyperkalemia.  -IV hydralazine as needed.   Type 2 diabetes mellitus with peripheral neuropathy (HCC) - Hemoglobin A1c 4.6 (12/20/2018 ) -Repeat hemoglobin A1c 3.9. -Discontinue Neurontin secondary to altered mental status -Discontinue sliding scale insulin.  Gout - Colchicine as needed.  History of renal transplant -  She is status post renal and pancreatic transplant with antirejection medication noncompliance. - She is currently being evaluated for renal transplant. -  Continue prednisone and cyclosporine.   -Nephrology following.  Fever -??  Etiology -Tmax 102.9. -Check UA with cultures and sensitivities, check blood cultures x2, check a chest x-ray. -Check a CT head as patient also with acute encephalopathy. -Continue treatment for COVID. -Place empirically on IV vancomycin, IV cefepime, IV ampicillin, IV acyclovir.  COVID-19 virus infection - Patient noted to be positive for COVID-19 infection -Follow inflammatory markers. -Patient chronically on prednisone. -Continue molnupiravir. -Supportive care.    Hyperkalemia - This was managed with D50 insulin on presentation to the ED.  -Patient was on [place].  -Status post hemodialysis with resolution of hyperkalemia.  -Discontinue Lokelma.  -Nephrology following.         DVT prophylaxis: SCDs Code Status: Full Family Communication: No family at bedside.  Updated contact in chart, Elsie Amis (who states she is the closest thing she has an family here.) Disposition: TBD  Status is: Inpatient Remains inpatient appropriate because: Severity of illness           Consultants:  Nephrology: Dr. Joelyn Oms  Procedures:  CT head 11/06/2021 Chest x-ray 11/06/2021 CT head pending 11/07/2021  Antimicrobials:  IV vancomycin 11/07/2021>>>> IV cefepime 11/07/2021>>>> IV ampicillin 11/07/2021>>>> IV acyclovir 11/07/2021>>>>   Subjective: Patient laying in bed, alert, not following commands, moaning.  RN at bedside attempted to give a lactulose enema.  Patient did have to receive hemodialysis around 2 AM.  Tmax 102.9 around 5 AM this morning.   Objective: Vitals:   11/07/21 0620 11/07/21 0630 11/07/21 0640 11/07/21 0700  BP:  (!) 86/62  (!) 160/79  Pulse:  (!) 104 100 (!) 107  Resp:  (!) 30 (!) 37 (!) 25  Temp:    (!) 101.2 F (38.4 C)  TempSrc:    Axillary  SpO2: 97% 96% 99% 91%  Weight:    53.4 kg  Height:        Intake/Output Summary (Last 24 hours) at 11/07/2021 1034 Last data  filed at 11/07/2021 0817 Gross per 24 hour  Intake 0 ml  Output 3161 ml  Net -3161 ml   Filed Weights   11/06/21 0221 11/07/21 0300 11/07/21 0700  Weight: 61.2 kg 56.5 kg 53.4 kg    Examination:  General exam: Drowsy/lethargic. Respiratory system: Lungs clear to auscultation bilaterally.  No wheezes, no rhonchi.  Fair air movement.   Cardiovascular system: Regular rate and rhythm no murmurs rubs or gallops.  No JVD.  No lower extremity edema. Gastrointestinal system: Abdomen is nondistended, soft and nontender. No organomegaly or masses felt. Normal bowel sounds heard. Central nervous system: Alert.  Moaning.  Moving extremities spontaneously. Extremities: Symmetric 5 x 5 power. Skin: No rashes, lesions or ulcers Psychiatry: Judgement and insight appear poor. Mood & affect appropriate.     Data Reviewed:   CBC: Recent Labs  Lab 11/05/21 2317 11/05/21 2322 11/06/21 0441 11/07/21 0137  WBC 3.1*  --  3.4* 4.2  NEUTROABS 2.3  --   --  3.4  HGB 12.5 13.3 11.9* 11.8*  HCT 38.4 39.0 36.5 36.3  MCV 94.1  --  93.6 93.8  PLT 79*  --  75* 64*    Basic Metabolic Panel: Recent Labs  Lab 11/05/21 2317 11/05/21 2322 11/06/21 0441 11/07/21 0137 11/07/21 0519  NA 139 137 140 138 138  K 5.9* 5.8* 5.5* >7.5* 3.9  CL 91* 96* 95* 96*  95*  CO2 28  --  '27 22 25  '$ GLUCOSE 79 72 74 75 85  BUN 65* 62* 72* 100* 42*  CREATININE 9.65* 10.50* 10.30* 11.97* 6.37*  CALCIUM 8.6*  --  8.3* 7.5* 8.9  PHOS  --   --   --  8.7*  --     GFR: Estimated Creatinine Clearance: 10 mL/min (A) (by C-G formula based on SCr of 6.37 mg/dL (H)).  Liver Function Tests: Recent Labs  Lab 11/05/21 2317 11/07/21 0137 11/07/21 0519  AST 27  --  28  ALT 15  --  16  ALKPHOS 71  --  73  BILITOT 0.8  --  0.4  PROT 6.6  --  8.2*  ALBUMIN 3.4* 3.1* 4.0    CBG: Recent Labs  Lab 11/06/21 0151 11/06/21 1321 11/06/21 1741 11/06/21 2029 11/06/21 2340  GLUCAP 117* 99 82 73 82     Recent Results  (from the past 240 hour(s))  Resp Panel by RT-PCR (Flu A&B, Covid) Nasopharyngeal Swab     Status: Abnormal   Collection Time: 11/05/21 11:23 PM   Specimen: Nasopharyngeal Swab; Nasopharyngeal(NP) swabs in vial transport medium  Result Value Ref Range Status   SARS Coronavirus 2 by RT PCR POSITIVE (A) NEGATIVE Final    Comment: (NOTE) SARS-CoV-2 target nucleic acids are DETECTED.  The SARS-CoV-2 RNA is generally detectable in upper respiratory specimens during the acute phase of infection. Positive results are indicative of the presence of the identified virus, but do not rule out bacterial infection or co-infection with other pathogens not detected by the test. Clinical correlation with patient history and other diagnostic information is necessary to determine patient infection status. The expected result is Negative.  Fact Sheet for Patients: EntrepreneurPulse.com.au  Fact Sheet for Healthcare Providers: IncredibleEmployment.be  This test is not yet approved or cleared by the [place] FDA and  has been authorized for detection and/or diagnosis of SARS-CoV-2 by FDA under an Emergency Use Authorization (EUA).  This EUA will remain in effect (meaning this test can be used) for the duration of  the COVID-19 declaration under Section 564(b)(1) of the A ct, 21 U.S.C. section 360bbb-3(b)(1), unless the authorization is terminated or revoked sooner.     Influenza A by PCR NEGATIVE NEGATIVE Final   Influenza B by PCR NEGATIVE NEGATIVE Final    Comment: (NOTE) The Xpert Xpress SARS-CoV-2/FLU/RSV plus assay is intended as an aid in the diagnosis of influenza from Nasopharyngeal swab specimens and should not be used as a sole basis for treatment. Nasal washings and aspirates are unacceptable for Xpert Xpress SARS-CoV-2/FLU/RSV testing.  Fact Sheet for Patients: EntrepreneurPulse.com.au  Fact Sheet for Healthcare  Providers: IncredibleEmployment.be  This test is not yet approved or cleared by the [place] FDA and has been authorized for detection and/or diagnosis of SARS-CoV-2 by FDA under an Emergency Use Authorization (EUA). This EUA will remain in effect (meaning this test can be used) for the duration of the COVID-19 declaration under Section 564(b)(1) of the Act, 21 U.S.C. section 360bbb-3(b)(1), unless the authorization is terminated or revoked.  Performed at [facility], [address], [place], [place] [address]          Radiology Studies: CT Head Wo Contrast  Result Date: 11/06/2021 CLINICAL DATA:  Altered mental status EXAM: CT HEAD WITHOUT CONTRAST TECHNIQUE: Contiguous axial images were obtained from the base of the skull through the vertex without intravenous contrast. RADIATION DOSE REDUCTION: This exam was performed according  to the departmental dose-optimization program which includes automated exposure control, adjustment of the mA and/or kV according to patient size and/or use of iterative reconstruction technique. COMPARISON:  11/04/2018 FINDINGS: Brain: No evidence of acute infarction, hemorrhage, hydrocephalus, extra-axial collection or mass lesion/mass effect. 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 Skull: Normal. Negative for fracture or focal lesion. Sinuses/Orbits: No acute finding. Other: None. IMPRESSION: No acute intracranial abnormality noted. Electronically Signed   By: Inez Catalina M.D.   On: 11/06/2021 01:57   DG CHEST PORT 1 VIEW  Result Date: 11/07/2021 CLINICAL DATA:  COVID-19, fever, altered mental status EXAM: PORTABLE CHEST 1 VIEW COMPARISON:  Portable exam 0838 hours compared to 11/06/2021 FINDINGS: Enlargement of cardiac silhouette. Mediastinal contours and pulmonary vascularity normal. Hazy BILATERAL airspace infiltrates RIGHT greater than LEFT consistent  with pneumonia and history of COVID-19. No pleural effusion or pneumothorax. Osseous structures unremarkable. IMPRESSION: Hazy BILATERAL pulmonary infiltrates consistent with pneumonia and COVID-19. Electronically Signed   By: Lavonia Dana M.D.   On: 11/07/2021 08:50   DG Chest Portable 1 View  Result Date: 11/06/2021 CLINICAL DATA:  Difficulty breathing EXAM: PORTABLE CHEST 1 VIEW COMPARISON:  12/08/2020 FINDINGS: Cardiac shadow is enlarged but stable. Previously seen dialysis catheter has been removed in the interval. Generalize pulmonary edema is noted likely related to volume overload. No focal infiltrate or effusion is seen. No bony abnormality is noted. IMPRESSION: Mild pulmonary edema likely related to volume overload. Electronically Signed   By: Inez Catalina M.D.   On: 11/06/2021 00:35        Scheduled Meds:  amLODipine  10 mg Per Tube QHS   [START ON 11/08/2021] vitamin C  500 mg Per Tube Daily   aspirin  81 mg Per Tube Daily   Chlorhexidine Gluconate Cloth  6 each Topical Daily   cholecalciferol  1,000 Units Oral Daily   cloNIDine  0.1 mg Transdermal Q Sun   cycloSPORINE  200 mg Per Tube Daily   cycloSPORINE  225 mg Per Tube QHS   ferric citrate  630 mg Oral TID WC   labetalol  600 mg Per Tube BID   lactulose  30 g Per Tube TID   molnupiravir EUA  4 capsule Oral BID   mycophenolate  500 mg Per Tube BID   predniSONE  15 mg Per Tube Daily   zinc sulfate  220 mg Per Tube Daily   Continuous Infusions:  sodium chloride     sodium chloride     acyclovir (ZOVIRAX) </= 700 mg IVPB     ampicillin (OMNIPEN) IV     ceFEPime (MAXIPIME) IV     dextrose 5 % and 0.9% NaCl     [START ON 11/09/2021] vancomycin       LOS: 1 day    Time spent: 40 minutes    Irine Seal, MD [name]   To contact the attending provider between 7A-7P or the covering provider during after hours 7P-7A, please log into the web site [email or web address] and access using universal [place] password  for that web site. If you do not have the password, please call the hospital operator.  11/07/2021, 10:34 AM

## 2021-11-07 NOTE — Progress Notes (Addendum)
[facility] ?Progress Note ? ?Subjective: seen in room.  Had HD early this am from 3 - 6 am approximately. Is not responding.  ? ?Vitals:  ? 11/07/21 0630 11/07/21 0640 11/07/21 0700 11/07/21 1100  ?BP: (!) 86/62  (!) 160/79 125/67  ?Pulse: (!) 104 100 (!) 107 (!) 111  ?Resp: (!) 30 (!) 37 (!) 25 (!) 29  ?Temp:   (!) 101.2 ?F (38.4 ?C) (!) 101.3 ?F (38.5 ?C)  ?TempSrc:   Axillary   ?SpO2: 96% 99% 91% 96%  ?Weight:   53.4 kg   ?Height:      ? ? ?Exam: ? Obtunded, unresponsive ? no jvd ? Chest cta bilat ? Cor reg no RG ? Abd soft ntnd no ascites ?  Ext no UE/ LE edema ?  Neuro - as above, eyes rolling back off and on ? ? ? ? OP HD: AF MWF ? 3h 42mn  400/500  59kg  2/2 bath  RUE AVF  15g  Hep 2000 ? - hectorol 6 ug tiw IV ? - no esa ? ?    BUN 42, Creat 10 > 6.3  this am.  NH3 98 >> 42 today ?   3/06 CXR - IMPRESSION: ?1. Interval placement of NG tube extending into the stomach. ?2. Unchanged diffuse bilateral interstitial opacities, edema versus ?multifocal infection. ? ? ? ?Assessment/ Plan: ?AMS - no better after HD and NH3 lowering. My need neuro input and/ or CCM. Have d/w pmd. Will get cyclosporine level in am.  ?Fevers - high fevers, + COVID infection. Per pmd.  ?Hyperkalemia - better this am after overnight HD ?ESRD - on HD MWF. Had HD overnight. Next HD Wed.  ?HTN/ volume - CXR does not look wet and not vol overloaded on exam. She is 5-6 kg below dry wt. CXR findings more likely infectious. BP's were high, now normal. Keep even w/ next HD. Cont HTN meds per NG. Hold lasix.  ?DM2 ?H/o failed renal transplant - getting cyclosporine, cellcept and prednisone per NG  ?MBD ckd - hold binders while not eating and cont IV vdra ?Anemia ckd - Hb > 10, no esa needs ? ? ? ? ?Rob Santhosh Gulino ?11/07/2021, 3:49 PM ? ? ?Recent Labs  ?Lab 11/06/21 ?0441 11/07/21 ?0137 11/07/21 ?06606 ?K 5.5* >7.5* 3.9  ?BUN 72* 100* 42*  ?CREATININE 10.30* 11.97* 6.37*  ?ALBUMIN  --  3.1* 4.0  ?CALCIUM 8.3* 7.5* 8.9  ?PHOS  --  8.7*   --   ?HGB 11.9* 11.8*  --   ? ?Inpatient medications: ? amLODipine  10 mg Per Tube QHS  ? [START ON 11/08/2021] vitamin C  500 mg Per Tube Daily  ? aspirin  81 mg Per Tube Daily  ? Chlorhexidine Gluconate Cloth  6 each Topical Daily  ? cholecalciferol  1,000 Units Oral Daily  ? cloNIDine  0.1 mg Transdermal Q Sun  ? cycloSPORINE  200 mg Per Tube Daily  ? cycloSPORINE  225 mg Per Tube QHS  ? ferric citrate  630 mg Oral TID WC  ? labetalol  600 mg Per Tube BID  ? lactulose  30 g Per Tube TID  ? molnupiravir EUA  4 capsule Oral BID  ? mycophenolate  500 mg Per Tube BID  ? predniSONE  15 mg Per Tube Daily  ? zinc sulfate  220 mg Per Tube Daily  ? ? sodium chloride    ? sodium chloride    ? acyclovir (ZOVIRAX) </= 700  mg IVPB 260 mg (11/07/21 1247)  ? ampicillin (OMNIPEN) IV 2 g (11/07/21 1243)  ? ceFEPime (MAXIPIME) IV 1 g (11/07/21 1451)  ? dextrose 5 % and 0.9% NaCl    ? [START ON 11/09/2021] vancomycin    ? ?sodium chloride, sodium chloride, acetaminophen **OR** acetaminophen, colchicine, diphenhydrAMINE, heparin, heparin, hydrALAZINE, lidocaine (PF), lidocaine-prilocaine, magnesium hydroxide, metoCLOPramide, naLOXone (NARCAN)  injection, ondansetron **OR** ondansetron (ZOFRAN) IV, pentafluoroprop-tetrafluoroeth ? ? ? ? ? ? ?

## 2021-11-07 NOTE — Progress Notes (Signed)
Pt transferred to 3M13 ? ?Chrisandra Carota, RN ?11/07/2021 ?3:39 PM  ? ?

## 2021-11-07 NOTE — Procedures (Addendum)
Patient Name: [name] [name]  ?MRN: [id number]  ?Epilepsy Attending: Lora Havens  ?Referring Physician/Provider: Amie Portland, MD ?Date: 11/07/2021 ?Duration: 21.57 mins ? ?Patient history: 41 year old who has had a few days worth of feeling unwell and was brought into the hospital for altered mental status. EEG to evaluate for seizure ? ?Level of alertness:  lethargic  ? ?AEDs during EEG study: None ? ?Technical aspects: This EEG study was done with scalp electrodes positioned according to the 10-20 International system of electrode placement. Electrical activity was acquired at a sampling rate of '500Hz'$  and reviewed with a high frequency filter of '70Hz'$  and a low frequency filter of '1Hz'$ . EEG data were recorded continuously and digitally stored.  ? ?Description: EEG showed continuous generalized 3 to 5 Hz theta-delta slowing. Generalized periodic discharges with triphasic morphology were also noted at 2.5 Hz. Hyperventilation and photic stimulation were not performed.    ? ?ABNORMALITY ?- Periodic discharges with triphasic morphology, generalized ( GPDs) ?- Continuous slow, generalized ? ?IMPRESSION: ?This study  showed generalized periodic discharges with triphasic morphology at 2.5 Hz. This morphology of discharges is commonly due to toxic-metabolic causes like hyperammonemia, uremia. However, the frequency of discharges at 2.'5hz'$  is on the ictal-interitctal continuum. Dicussed with neurologist Dr Rory Percy to consider ativan challenge and long term monitoring.  ? ?Additionally, this study is suggestive of severe diffuse encephalopathy, nonspecific etiology. No definite seizures were seen throughout the recording. ? ?Lora Havens  ? ?

## 2021-11-08 ENCOUNTER — Inpatient Hospital Stay (HOSPITAL_COMMUNITY): Payer: Medicare Other

## 2021-11-08 DIAGNOSIS — R7989 Other specified abnormal findings of blood chemistry: Secondary | ICD-10-CM

## 2021-11-08 DIAGNOSIS — G9340 Encephalopathy, unspecified: Secondary | ICD-10-CM | POA: Diagnosis not present

## 2021-11-08 DIAGNOSIS — U071 COVID-19: Secondary | ICD-10-CM | POA: Diagnosis not present

## 2021-11-08 DIAGNOSIS — D849 Immunodeficiency, unspecified: Secondary | ICD-10-CM

## 2021-11-08 DIAGNOSIS — N186 End stage renal disease: Secondary | ICD-10-CM

## 2021-11-08 LAB — CBC WITH DIFFERENTIAL/PLATELET
Abs Immature Granulocytes: 0.03 10*3/uL (ref 0.00–0.07)
Basophils Absolute: 0 10*3/uL (ref 0.0–0.1)
Basophils Relative: 0 %
Eosinophils Absolute: 0 10*3/uL (ref 0.0–0.5)
Eosinophils Relative: 0 %
HCT: 42.2 % (ref 36.0–46.0)
Hemoglobin: 13.5 g/dL (ref 12.0–15.0)
Immature Granulocytes: 1 %
Lymphocytes Relative: 10 %
Lymphs Abs: 0.5 10*3/uL — ABNORMAL LOW (ref 0.7–4.0)
MCH: 29.9 pg (ref 26.0–34.0)
MCHC: 32 g/dL (ref 30.0–36.0)
MCV: 93.6 fL (ref 80.0–100.0)
Monocytes Absolute: 0.2 10*3/uL (ref 0.1–1.0)
Monocytes Relative: 4 %
Neutro Abs: 4.2 10*3/uL (ref 1.7–7.7)
Neutrophils Relative %: 85 %
Platelets: 65 10*3/uL — ABNORMAL LOW (ref 150–400)
RBC: 4.51 MIL/uL (ref 3.87–5.11)
RDW: 13.1 % (ref 11.5–15.5)
WBC: 4.9 10*3/uL (ref 4.0–10.5)
nRBC: 0 % (ref 0.0–0.2)

## 2021-11-08 LAB — PROTEIN AND GLUCOSE, CSF
Glucose, CSF: 84 mg/dL — ABNORMAL HIGH (ref 40–70)
Total  Protein, CSF: 31 mg/dL (ref 15–45)

## 2021-11-08 LAB — CSF CELL COUNT WITH DIFFERENTIAL
RBC Count, CSF: 2 /mm3 — ABNORMAL HIGH
RBC Count, CSF: 91 /mm3 — ABNORMAL HIGH
Tube #: 1
Tube #: 4
WBC, CSF: 1 /mm3 (ref 0–5)
WBC, CSF: 1 /mm3 (ref 0–5)

## 2021-11-08 LAB — RENAL FUNCTION PANEL
Albumin: 3.4 g/dL — ABNORMAL LOW (ref 3.5–5.0)
Anion gap: 25 — ABNORMAL HIGH (ref 5–15)
BUN: 74 mg/dL — ABNORMAL HIGH (ref 6–20)
CO2: 18 mmol/L — ABNORMAL LOW (ref 22–32)
Calcium: 7.5 mg/dL — ABNORMAL LOW (ref 8.9–10.3)
Chloride: 96 mmol/L — ABNORMAL LOW (ref 98–111)
Creatinine, Ser: 8.58 mg/dL — ABNORMAL HIGH (ref 0.44–1.00)
GFR, Estimated: 6 mL/min — ABNORMAL LOW (ref >60)
Glucose, Bld: 131 mg/dL — ABNORMAL HIGH (ref 70–99)
Phosphorus: 11 mg/dL — ABNORMAL HIGH (ref 2.5–4.6)
Potassium: 4.7 mmol/L (ref 3.5–5.1)
Sodium: 139 mmol/L (ref 135–145)

## 2021-11-08 LAB — GLUCOSE, CAPILLARY
Glucose-Capillary: 134 mg/dL — ABNORMAL HIGH (ref 70–99)
Glucose-Capillary: 113 mg/dL — ABNORMAL HIGH (ref 70–99)
Glucose-Capillary: 116 mg/dL — ABNORMAL HIGH (ref 70–99)
Glucose-Capillary: 120 mg/dL — ABNORMAL HIGH (ref 70–99)
Glucose-Capillary: 104 mg/dL — ABNORMAL HIGH (ref 70–99)
Glucose-Capillary: 130 mg/dL — ABNORMAL HIGH (ref 70–99)

## 2021-11-08 LAB — HCG, SERUM, QUALITATIVE: Preg, Serum: NEGATIVE

## 2021-11-08 LAB — MRSA NEXT GEN BY PCR, NASAL: MRSA by PCR Next Gen: NOT DETECTED

## 2021-11-08 LAB — HEPATITIS B SURFACE ANTIBODY, QUANTITATIVE: Hep B S AB Quant (Post): 825.7 m[IU]/mL (ref >9.9)

## 2021-11-08 LAB — C-REACTIVE PROTEIN: CRP: 6.7 mg/dL — ABNORMAL HIGH (ref <1.0)

## 2021-11-08 LAB — ACETAMINOPHEN LEVEL: Acetaminophen (Tylenol), Serum: <10 ug/mL — ABNORMAL LOW (ref 10–30)

## 2021-11-08 LAB — FERRITIN: Ferritin: 7146 ng/mL — ABNORMAL HIGH (ref 11–307)

## 2021-11-08 IMAGING — DX DG PORTABLE PELVIS
1 series · 1 of 1 positions shown · non-contrast
Comparison: Abdominal x-ray [DATE].

CLINICAL DATA: Foreign body screening.

EXAM:
PORTABLE PELVIS 1-2 VIEWS

[pelvis]
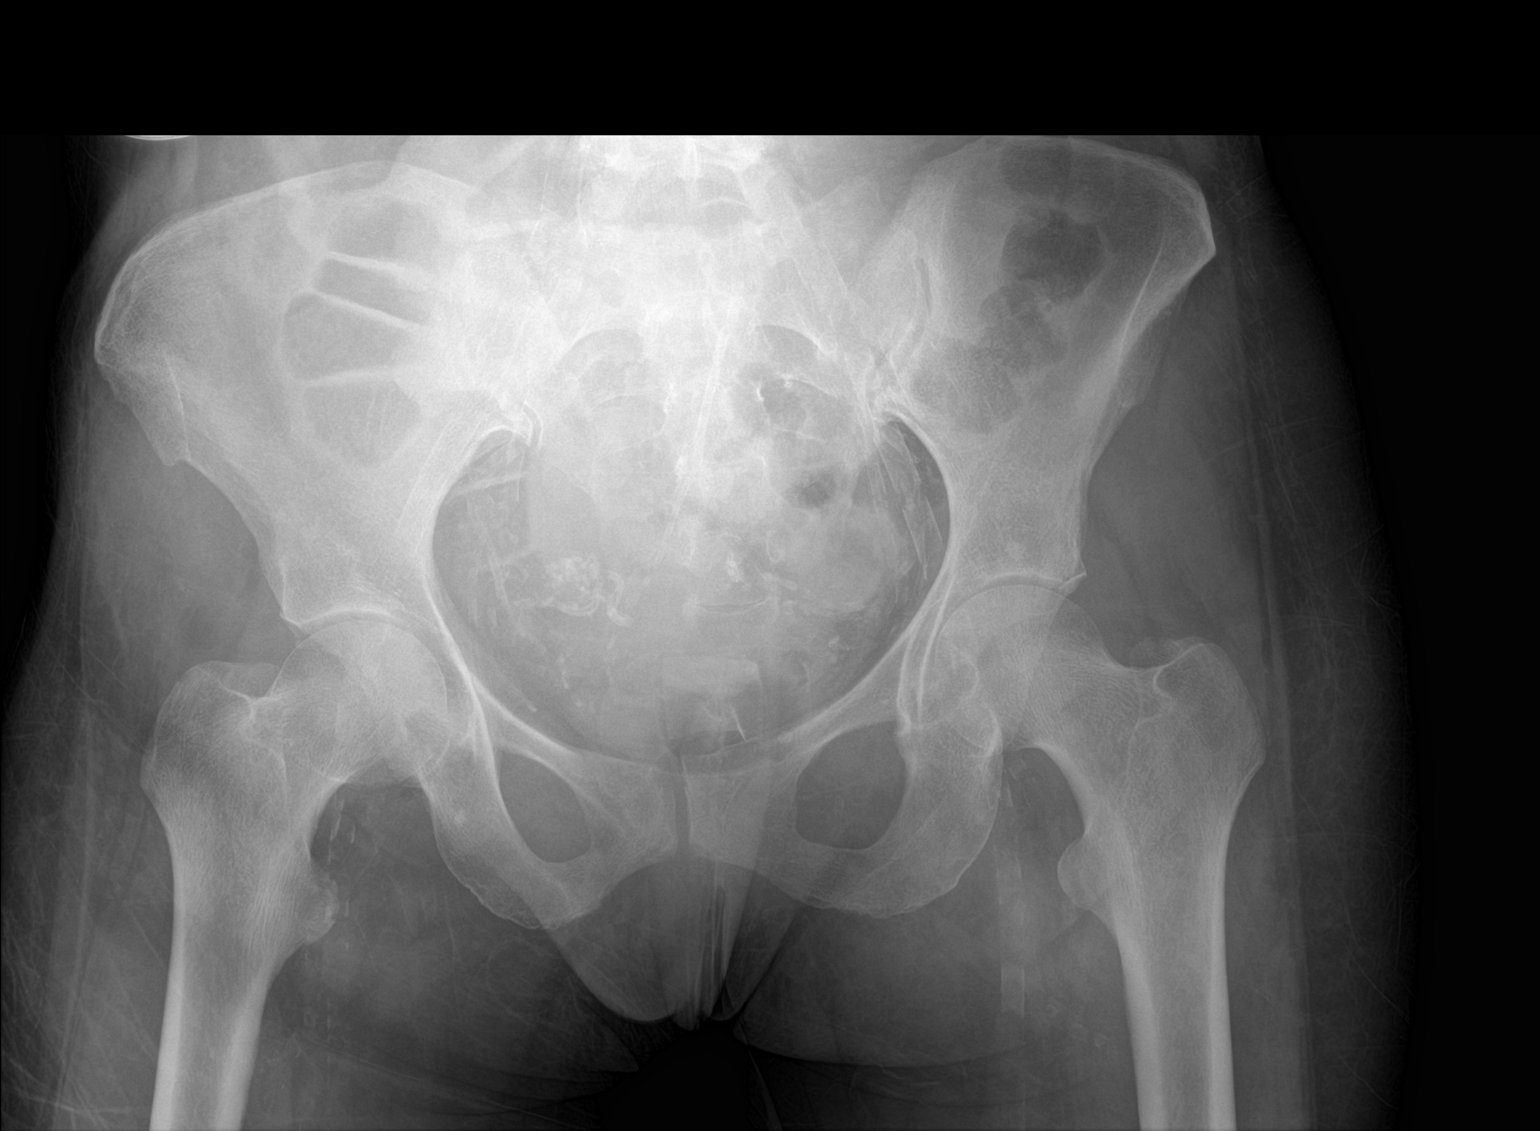

[1 of 1 positions shown; findings below may reference images not displayed]

FINDINGS: There is no evidence for radiopaque foreign body. There are
extensive vascular calcifications in the pelvis and lower
extremities. No acute fractures. Catheter overlies the right mid
abdomen.
IMPRESSION: 1. No radiopaque foreign body identified.
2. Extensive vascular calcifications.

## 2021-11-08 MED ORDER — PIPERACILLIN-TAZOBACTAM 3.375 G IVPB 30 MIN
3.3750 g | Freq: Once | INTRAVENOUS | Status: AC
  Administered 2021-11-08: 3.375 g via INTRAVENOUS
  Filled 2021-11-08 (×2): qty 50

## 2021-11-08 MED ORDER — PIPERACILLIN-TAZOBACTAM IN DEX 2-0.25 GM/50ML IV SOLN
2.2500 g | Freq: Three times a day (TID) | INTRAVENOUS | Status: DC
  Administered 2021-11-08 – 2021-11-09 (×2): 2.25 g via INTRAVENOUS
  Filled 2021-11-08 (×2): qty 50

## 2021-11-08 MED ORDER — MIDAZOLAM HCL 2 MG/2ML IJ SOLN
INTRAMUSCULAR | Status: AC
  Administered 2021-11-08: 2 mg
  Filled 2021-11-08: qty 2

## 2021-11-08 MED ORDER — SODIUM CHLORIDE 0.9 % IV BOLUS
1000.0000 mL | Freq: Once | INTRAVENOUS | Status: AC
  Administered 2021-11-08: 1000 mL via INTRAVENOUS

## 2021-11-08 MED ORDER — SODIUM CHLORIDE 0.9 % IV SOLN
2.0000 g | INTRAVENOUS | Status: DC
  Filled 2021-11-08: qty 2000

## 2021-11-08 MED ORDER — SODIUM CHLORIDE 0.9 % IV BOLUS
500.0000 mL | Freq: Once | INTRAVENOUS | Status: AC
  Administered 2021-11-08: 500 mL via INTRAVENOUS

## 2021-11-08 MED ORDER — DEXTROSE 5 % IV SOLN
260.0000 mg | INTRAVENOUS | Status: DC
  Filled 2021-11-08 (×2): qty 5.2

## 2021-11-08 MED ORDER — FERRIC CITRATE 1 GM 210 MG(FE) PO TABS
630.0000 mg | ORAL_TABLET | Freq: Three times a day (TID) | ORAL | Status: DC
  Administered 2021-11-08 – 2021-11-17 (×20): 630 mg via ORAL
  Filled 2021-11-08 (×25): qty 3

## 2021-11-08 NOTE — Progress Notes (Signed)
eLink Physician-Brief Progress Note ?Patient Name: [name] [name] ?DOB: 1980-09-25 ?MRN: [id number] ? ? ?Date of Service ? 11/08/2021  ?HPI/Events of Note ? Hypotension - BP = 82/48 with MAP = 59 and HR = 79. Patient received several scheduled antihypertensive medications tonight.   ?eICU Interventions ? Plan: ?Bolus with 0.9 NaCl 500 mL IV over 1 hour now.   ? ? ? ?Intervention Category ?Major Interventions: Hypotension - evaluation and management ? ?[name] [name] ?11/08/2021, 4:26 AM ?

## 2021-11-08 NOTE — Procedures (Addendum)
Patient Name: [name] [name]  ?MRN: [id number]  ?Epilepsy Attending: Lora Havens  ?Referring Physician/Provider: Amie Portland, MD ?Duration: 11/07/2021 2211 to 11/08/2021 2211 ?  ?Patient history: 41 year old who has had a few days worth of feeling unwell and was brought into the hospital for altered mental status. EEG to evaluate for seizure ?  ?Level of alertness:  lethargic  ?  ?AEDs during EEG study: None ?  ?Technical aspects: This EEG study was done with scalp electrodes positioned according to the 10-20 International system of electrode placement. Electrical activity was acquired at a sampling rate of '500Hz'$  and reviewed with a high frequency filter of '70Hz'$  and a low frequency filter of '1Hz'$ . EEG data were recorded continuously and digitally stored.  ?  ?Description: At the beginning of the study, EEG showed generalized periodic discharges with triphasic morphology at 2.5 Hz.  IV Ativan 2 mg was administered at 1840 on 11/07/2021 after which EEG showed continuous generalized 3 to 5 Hz theta-delta slowing. Intermittent generalized periodic discharges with triphasic morphology were also noted at 1.5-2 Hz, more prominent when awake/stimulated. Hyperventilation and photic stimulation were not performed.    ?  ?ABNORMALITY ?- Periodic discharges with triphasic morphology, generalized ( GPDs) ?- Continuous slow, generalized ?  ?IMPRESSION: ?At the beginning of the study, EEG showed generalized periodic discharges with triphasic morphology at 2.5 Hz which was on the ictal-interictal continuum.  IV Ativan 2 mg was administered at [place] on 11/07/2021 after which EEG improved. This study then showed generalized periodic discharges with triphasic morphology at 1.5-2 Hz which were more prominent when patient was awake/stimulated.  This EEG pattern is most likely due to toxic-metabolic causes like hyperammonemia, uremia, cefepime toxicity.   ? ?Additionally, this study is suggestive of severe diffuse encephalopathy,  nonspecific etiology. No definite seizures were seen throughout the recording. ? ?Lora Havens  ?

## 2021-11-08 NOTE — Progress Notes (Signed)
Pharmacy Antibiotic Note ? ?[name] [name] is a 41 y.o. female admitted on 11/05/2021 with fever and altered mental status in the setting of COVID + and immunosuppression for hx of renal and pancreatic transplant.  Pharmacy has been consulted for cefepime, vancomycin, ampicillin and acyclovir dosing for suspected meningitis.  Patient is on dialysis MWF.  ? ?Afebrile now. CXR without infection. COVID positive.  ? ?D2 abx to r/o meningitis/encephalitis. LP was done today. Cefepime being change to zosyn.  ? ?Plan: ?Vancomycin '500mg'$  post HD MWF ?Zosyn 3.375g IV x1 then 2.28g IV q8 ?Acyclovir '5mg'$ /kg (= '260mg'$ ) Q24hr ?Monitor cultures, clinical status, renal function, vancomycin level ?Narrow abx as able and f/u duration  ? ? ? ?Height: '5\' 4"'$  (162.6 cm) ?Weight: 53.4 kg (117 lb 11.6 oz) ?IBW/kg (Calculated) : 54.7 ? ?Temp (24hrs), Avg:98.2 ?F (36.8 ?C), Min:97.7 ?F (36.5 ?C), Max:98.4 ?F (36.9 ?C) ? ?Recent Labs  ?Lab 11/05/21 ?2317 11/05/21 ?2322 11/06/21 ?0441 11/07/21 ?0137 11/07/21 ?3354 11/08/21 ?0225  ?WBC 3.1*  --  3.4* 4.2  --  4.9  ?CREATININE 9.65* 10.50* 10.30* 11.97* 6.37* 8.58*  ? ?  ?Estimated Creatinine Clearance: 7.4 mL/min (A) (by C-G formula based on SCr of 8.58 mg/dL (H)).   ? ?Allergies  ?Allergen Reactions  ? Iodinated Contrast Media   ?  Other reaction(s): Unknown  ? ? ?Antimicrobials this admission: ?Cefe 3/6 >> 3/7 ?Vanc 3/6 >>  ?Amp 3/6 >>3/6 ?Acyclovir 3/6 >>  ?Zosyn 3/7>> ?  ? ?Microbiology results: ?3/6 UCx >> ?3/6 BCx >>ngtd ?3/7 CSF >> ?3/7 HSV >> ? ?Onnie Boer, PharmD, BCIDP, AAHIVP, CPP ?Infectious Disease Pharmacist ?11/08/2021 3:16 PM ? ? ? ?

## 2021-11-08 NOTE — Progress Notes (Signed)
LTM EEG maint complete - no skin breakdown under:  FP1 A1 Cz rehooked multiple leads and wrapped head ?

## 2021-11-08 NOTE — Progress Notes (Addendum)
Neurology Progress Note   S:// Seen and examined in 46M-ICU Much more awake and cooperative today.  O:// Current vital signs: BP (!) 122/57    Pulse 74    Temp 98.4 F (36.9 C) (Axillary)    Resp 17    Ht '5\' 4"'$  (1.626 m)    Wt 53.4 kg    LMP  (LMP Unknown) Comment: pt ams   SpO2 100%    BMI 20.21 kg/m  Vital signs in last 24 hours: Temp:  [97.7 F (36.5 C)-98.4 F (36.9 C)] 98.4 F (36.9 C) (03/07 1153) Pulse Rate:  [73-89] 74 (03/07 1200) Resp:  [9-35] 17 (03/07 1200) BP: (82-142)/(24-70) 122/57 (03/07 1100) SpO2:  [94 %-100 %] 100 % (03/07 1200) GENERAL: Awake, alert in NAD HEENT: - Normocephalic and atraumatic, dry mm, no LN++, no Thyromegally LUNGS - Clear to auscultation bilaterally with no wheezes CV - S1S2 RRR, no m/r/g, equal pulses bilaterally. ABDOMEN - Soft, nontender, nondistended with normoactive BS Ext: warm, well perfused, intact peripheral pulses, no edema NEURO:  Drowsy but opens eyes to voice, follows commands Is able to have a coherent conversation Still is a little confused-that she is 40 years old when she is actually 41 years old. Told me that she was getting her dialysis regularly Knows that she is in the hospital Her speech is clear No aphasia Cranial nerves II to XII intact Motor examination with antigravity strength in the upper extremities and somewhat weaker but symmetric antigravity in lower extremities Sensation intact to touch   Medications  Current Facility-Administered Medications:    0.9 %  sodium chloride infusion, 100 mL, Intravenous, PRN, Joelyn Oms, Ryan B, MD   0.9 %  sodium chloride infusion, 100 mL, Intravenous, PRN, Rexene Agent, MD   acyclovir (ZOVIRAX) 260 mg in dextrose 5 % 100 mL IVPB, 260 mg, Intravenous, Q24H, Millen, Jessica B, RPH   ampicillin (OMNIPEN) 2 g in sodium chloride 0.9 % 100 mL IVPB, 2 g, Intravenous, Q24H, Millen, Jessica B, RPH   ascorbic acid (VITAMIN C) tablet 500 mg, 500 mg, Per Tube, Daily, Donnamae Jude,  RPH, 500 mg at 11/08/21 3295   aspirin chewable tablet 81 mg, 81 mg, Per Tube, Daily, Donnamae Jude, RPH, 81 mg at 11/08/21 1884   ceFEPIme (MAXIPIME) 1 g in sodium chloride 0.9 % 100 mL IVPB, 1 g, Intravenous, QHS, Donnamae Jude, RPH, Stopped at 11/07/21 1521   Chlorhexidine Gluconate Cloth 2 % PADS 6 each, 6 each, Topical, Daily, Mansy, Jan A, MD, 6 each at 11/08/21 1030   cholecalciferol (VITAMIN D3) tablet 1,000 Units, 1,000 Units, Oral, Daily, Mansy, Jan A, MD, 1,000 Units at 11/08/21 1660   colchicine tablet 0.6 mg, 0.6 mg, Per Tube, TID PRN, Benetta Spar D, RPH, 0.6 mg at 11/07/21 1710   dextrose 5 %-0.9 % sodium chloride infusion, , Intravenous, Continuous, Eugenie Filler, MD, Last Rate: 20 mL/hr at 11/08/21 1100, Infusion Verify at 11/08/21 1100   diphenhydrAMINE (BENADRYL) 12.5 MG/5ML elixir 25 mg, 25 mg, Per Tube, Q6H PRN, Donnamae Jude, RPH   [START ON 11/09/2021] doxercalciferol (HECTOROL) injection 6 mcg, 6 mcg, Intravenous, Q M,W,F-HD, Roney Jaffe, MD   ferric citrate (AURYXIA) tablet 630 mg, 630 mg, Oral, TID WC, Noemi Chapel P, DO   heparin injection 1,000 Units, 1,000 Units, Dialysis, PRN, Pearson Grippe B, MD   heparin injection 1,200 Units, 20 Units/kg, Dialysis, PRN, Pearson Grippe B, MD   hydrALAZINE (APRESOLINE) injection 10 mg, 10 mg,  Intravenous, Q6H PRN, Eugenie Filler, MD   lactulose (CHRONULAC) 10 GM/15ML solution 30 g, 30 g, Per Tube, TID, Eugenie Filler, MD, 30 g at 11/08/21 0920   lidocaine (PF) (XYLOCAINE) 1 % injection 5 mL, 5 mL, Intradermal, PRN, Pearson Grippe B, MD   lidocaine-prilocaine (EMLA) cream 1 application, 1 application., Topical, PRN, Rexene Agent, MD   methylPREDNISolone sodium succinate (SOLU-MEDROL) 125 mg/2 mL injection 60 mg, 60 mg, Intravenous, Q24H, Candee Furbish, MD, 60 mg at 11/07/21 2150   metoCLOPramide (REGLAN) tablet 5 mg, 5 mg, Per Tube, Q6H PRN, Donnamae Jude, RPH   molnupiravir EUA (LAGEVRIO) capsule 800 mg, 4 capsule,  Oral, BID, Mansy, Jan A, MD, 800 mg at 11/08/21 0924   naloxone [facility]) injection 0.4 mg, 0.4 mg, Intravenous, PRN, Kommor, Madison, MD, 0.4 mg at 11/06/21 1011   ondansetron (ZOFRAN) tablet 4 mg, 4 mg, Per Tube, Q6H PRN **OR** ondansetron (ZOFRAN) injection 4 mg, 4 mg, Intravenous, Q6H PRN, Benetta Spar D, RPH   pentafluoroprop-tetrafluoroeth (GEBAUERS) aerosol 1 application, 1 application., Topical, PRN, Rexene Agent, MD   [START ON 11/09/2021] vancomycin (VANCOREADY) IVPB 500 mg/100 mL, 500 mg, Intravenous, Q M,W,F-HD, Donnamae Jude, RPH   zinc sulfate capsule 220 mg, 220 mg, Per Tube, Daily, Donnamae Jude, RPH, 220 mg at 11/08/21 0921 Labs CBC    Component Value Date/Time   WBC 4.9 11/08/2021 0225   RBC 4.51 11/08/2021 0225   HGB 13.5 11/08/2021 0225   HCT 42.2 11/08/2021 0225   PLT 65 (L) 11/08/2021 0225   MCV 93.6 11/08/2021 0225   MCH 29.9 11/08/2021 0225   MCHC 32.0 11/08/2021 0225   RDW 13.1 11/08/2021 0225   LYMPHSABS 0.5 (L) 11/08/2021 0225   MONOABS 0.2 11/08/2021 0225   EOSABS 0.0 11/08/2021 0225   BASOSABS 0.0 11/08/2021 0225    CMP     Component Value Date/Time   NA 139 11/08/2021 0225   K 4.7 11/08/2021 0225   CL 96 (L) 11/08/2021 0225   CO2 18 (L) 11/08/2021 0225   GLUCOSE 131 (H) 11/08/2021 0225   BUN 74 (H) 11/08/2021 0225   CREATININE 8.58 (H) 11/08/2021 0225   CALCIUM 7.5 (L) 11/08/2021 0225   PROT 8.2 (H) 11/07/2021 0519   ALBUMIN 3.4 (L) 11/08/2021 0225   AST 28 11/07/2021 0519   ALT 16 11/07/2021 0519   ALKPHOS 73 11/07/2021 0519   BILITOT 0.4 11/07/2021 0519   GFRNONAA 6 (L) 11/08/2021 0225   GFRAA 3 (L) 05/14/2020 0223   CSF Glu 81 Prt 31 RBC 91 & 2 WBC  1 & 1   Imaging I have reviewed images in epic and the results pertinent to this consultation are: NO NEW IMAGING  Assessment:  41 year old who presented to the hospital after feeling unwell for few days-has a history of ESRD, pancreatic and renal transplant that is fairly likely  secondary to noncompliance, extremely encephalopathic.  Her renal function was somewhat improving, initially came in with hyperammonemia which was improving-thought to have taken more than prescribed amount of opiates and Narcan was given with some improved response but then again became encephalopathic. Also COVID-positive with pulmonary edema Neurology consultation for altered mental status Initial thought process is that this is all related to her toxic metabolic encephalopathy from COVID-19 infection as well as ESRD but since she showed no improvement, she underwent a spinal tap results of which are bland and not convincing for infection. EEG overnight was done for concern  for seizures-some concern that the generalized periodic discharges could be something real rather than metabolic-Ativan challenge given which improved those findings.  Other etiology could be cefepime toxicity. No antiepileptics used and mentation is improving-likely EEG findings are secondary to toxic metabolic encephalopathy.  Impression: Toxic metabolic encephalopathy  Recommendations: Can discontinue meningitic antibiotic coverage-abx for sepsis/respiratory etiology per PCCM Can continue acyclovir till HSV PCR comes back negative Supportive care of COVID and toxic metabolic derangements per primary team as you are Change cefepime to another antibiotic-Dr. Carlis Abbott contacted and would switch to Zosyn. Continue LTM for 1 more day and if no seizures seen in the exam seems to be stable or improving, can discontinue tomorrow. D/W Dr Carlis Abbott PCCM  -- Amie Portland, MD Neurologist [name] Pager: [phone]  CRITICAL CARE ATTESTATION Performed by: Amie Portland, MD Total critical care time: 30 minutes Critical care time was exclusive of separately billable procedures and treating other patients and/or supervising APPs/Residents/Students Critical care was necessary to treat or prevent imminent or  life-threatening deterioration due to toxic metabolic encephalopathy  This patient is critically ill and at significant risk for neurological worsening and/or death and care requires constant monitoring. Critical care was time spent personally by me on the following activities: development of treatment plan with patient and/or surrogate as well as nursing, discussions with consultants, evaluation of patient's response to treatment, examination of patient, obtaining history from patient or surrogate, ordering and performing treatments and interventions, ordering and review of laboratory studies, ordering and review of radiographic studies, pulse oximetry, re-evaluation of patient's condition, participation in multidisciplinary rounds and medical decision making of high complexity in the care of this patient.

## 2021-11-08 NOTE — Progress Notes (Signed)
[facility] ?Progress Note ? ?Subjective: seen in room, more alert and is responding today. LP done, low WBC count.  ? ?Vitals:  ? 11/08/21 1000 11/08/21 1100 11/08/21 1153 11/08/21 1200  ?BP: (!) 112/44 (!) 122/57    ?Pulse: 73 74  74  ?Resp:    17  ?Temp:   98.4 ?F (36.9 ?C)   ?TempSrc:   Axillary   ?SpO2:    100%  ?Weight:      ?Height:      ? ? ?Exam: ? Lethargic, awakens to voice and responsive ? no jvd ? Chest cta bilat ? Cor reg no RG ? Abd soft ntnd no ascites ?  Ext no UE/ LE edema ?  Neuro as above, nonfocal ?  RUE AVF+bruit ? ? ? OP HD: AF MWF ? 3h 39mn  400/500  59kg  2/2 bath  RUE AVF  15g  Hep 2000 ? - hectorol 6 ug tiw IV ? - no esa ? ?    BUN 42, Creat 10 > 6.3  this am.  NH3 98 >> 42 today ?   3/06 CXR - IMPRESSION: ?1. Interval placement of NG tube extending into the stomach. ?2. Unchanged diffuse bilateral interstitial opacities, edema versus ?multifocal infection. ? ? ? ?Assessment/ Plan: ?AMS - EEG showed diffuse severe encephalopathy. Per neuro.  ?Fevers - high fevers, + COVID infection, on empiric IV abx ?Hyperkalemia - resolved w/ HD ?ESRD - usual HD MWF. Next HD tomorrow upstairs.  ?HTN - BP's low to low normal, norvasc/ catapres patch/ labetalol have been dc'd for now.  ?Volume - CXR does not look wet and not vol overloaded on exam. 5kg under dry wt. No UF w/ next HD, keep even.  ?DM2 ?H/o failed renal transplant - holding cyclosporine and myfortic due to presumed serious infection w/ complications. Getting IV steroids.  ?MBD ckd - resume binders when eating. Cont IV vdra ?Anemia ckd - Hb > 10, no esa needs ? ? ? ? ?Rob Jalayna Josten ?11/08/2021, 2:54 PM ? ? ?Recent Labs  ?Lab 11/07/21 ?0137 11/07/21 ?0970203/07/23 ?0225  ?K >7.5* 3.9 4.7  ?BUN 100* 42* 74*  ?CREATININE 11.97* 6.37* 8.58*  ?ALBUMIN 3.1* 4.0 3.4*  ?CALCIUM 7.5* 8.9 7.5*  ?PHOS 8.7*  --  11.0*  ?HGB 11.8*  --  13.5  ? ? ?Inpatient medications: ? vitamin C  500 mg Per Tube Daily  ? aspirin  81 mg Per Tube Daily  ?  Chlorhexidine Gluconate Cloth  6 each Topical Daily  ? cholecalciferol  1,000 Units Oral Daily  ? [START ON 11/09/2021] doxercalciferol  6 mcg Intravenous Q M,W,F-HD  ? ferric citrate  630 mg Oral TID WC  ? lactulose  30 g Per Tube TID  ? methylPREDNISolone (SOLU-MEDROL) injection  60 mg Intravenous Q24H  ? molnupiravir EUA  4 capsule Oral BID  ? zinc sulfate  220 mg Per Tube Daily  ? ? sodium chloride    ? sodium chloride    ? acyclovir (ZOVIRAX) </= 700 mg IVPB    ? ampicillin (OMNIPEN) IV    ? ceFEPime (MAXIPIME) IV Stopped (11/07/21 1521)  ? dextrose 5 % and 0.9% NaCl 20 mL/hr at 11/08/21 1100  ? [START ON 11/09/2021] vancomycin    ? ?sodium chloride, sodium chloride, colchicine, diphenhydrAMINE, heparin, heparin, hydrALAZINE, lidocaine (PF), lidocaine-prilocaine, metoCLOPramide, naLOXone (NARCAN)  injection, ondansetron **OR** ondansetron (ZOFRAN) IV, pentafluoroprop-tetrafluoroeth ? ? ? ? ? ? ?

## 2021-11-08 NOTE — Progress Notes (Signed)
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  Hospital course complicated by progressive obtundation prompting ICU evaluation.  Patient currently unable to provide any history.  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  Pancreas graft still apparently working. -History of narcotic abuse -Questionable compliance with rejection meds   Significant Hospital Events: Including procedures, antibiotic start and stop dates in addition to other pertinent events   3/5 admitted 3/6 Neuro consult, ICU transfer  Interim History / Subjective:  Patient appears more alert and awake today.  She was able to answer questions appropriately.  Objective   Blood pressure 105/60, pulse 80, temperature 98.2 F (36.8 C), temperature source Oral, resp. rate (!) 22, height '5\' 4"'$  (1.626 m), weight 53.4 kg, SpO2 100 %.        Intake/Output Summary (Last 24 hours) at 11/08/2021 0752 Last data filed at 11/08/2021 0600 Gross per 24 hour  Intake 989.3 ml  Output 1200 ml  Net -210.7 ml    Filed Weights   11/06/21 0221 11/07/21 0300 11/07/21 0700  Weight: 61.2 kg 56.5 kg 53.4 kg    Examination: General: ill appearing woman, appears older than stated age, more responsive today HENT: MM dry, sclera anicteric Lungs: In no acute respiratory distress.  On 2 L oxygen Cardiovascular: RRR, no murmur Abdomen: soft, nontender to palpation Extremities: trace edema of bilateral lower extremities. Neuro: More responsive, able to answer simple questions appropriately  Resolved Hospital Problem  list   N/A  Assessment & Plan:   Acute metabolic/septic encephalopathy Likely multifactorial from COVID infection, uremia and hyperammonemia.  She is also coverage for meningitis however the LP opening pressure and appearance does not strongly suggest meningitis. CT head was negative for acute abnormality but showed old infarcts.  Pending MRI to rule out recent stroke. -Continue vancomycin, cefepime, ampicillin and acyclovir for now.  Pending LP results -EEG initially was concern for epileptiform discharge but no seizure overnight. -Correct uremia with dialysis per nephrology -Correct hyperammonemia with lactulose.  No recorded past medical history of liver disease.  She is at high risk for autoimmune hepatitis with her type 1 diabetes.  Will obtain right upper quadrant ultrasound.  Sepsis  Tmax of 102.9 on 3/5.  Unclear definitive source of infection, could be secondary to COVID.  Pending LP result. Blood pressure improves after holding hypertensive medications. -Will continue hold HTN meds -Continue broad-spectrum antibiotics -Holding immunosuppressive medications. -Continue stress dose steroids  ESRD on HD Metabolic acidosis 2/2 uremia Last HD yesterday.  She may need an earlier HD schedule given rising BUN.   -Patient nephrology recommendations  COVID-19 infection Continue Lagevrio and steroids No baricitinib in the setting of possible bacterial infection  Chronic thrombocytopenia Worsening thrombocytopenia in the setting of acute illness. -Holding immunosuppressive therapies -CBC daily  Gastroparesis Continue Reglan  Type 1 diabetes Recent A1c of 3.9.  Be falsely low in the setting of ESRD.  CBG remains within good range.  No insulin indicated.  Avoid hypoglycemia  Best Practice (right click and "Reselect all SmartList Selections" daily)   Diet/type: renal DVT prophylaxis: heparin GI prophylaxis: N/A Lines: N/A Foley:  N/A Code Status:  full code Last date of  multidisciplinary goals of care discussion   Labs   CBC: Recent Labs  Lab 11/05/21 2317 11/05/21 2322 11/06/21 0441 11/07/21 0137 11/08/21 0225  WBC 3.1*  --  3.4* 4.2 4.9  NEUTROABS 2.3  --   --  3.4 4.2  HGB 12.5 13.3 11.9* 11.8* 13.5  HCT 38.4 39.0 36.5 36.3 42.2  MCV 94.1  --  93.6 93.8 93.6  PLT 79*  --  75* 64* 65*     Basic Metabolic Panel: Recent Labs  Lab 11/05/21 2317 11/05/21 2322 11/06/21 0441 11/07/21 0137 11/07/21 0519 11/08/21 0225  NA 139 137 140 138 138 139  K 5.9* 5.8* 5.5* >7.5* 3.9 4.7  CL 91* 96* 95* 96* 95* 96*  CO2 28  --  '27 22 25 '$ 18*  GLUCOSE 79 72 74 75 85 131*  BUN 65* 62* 72* 100* 42* 74*  CREATININE 9.65* 10.50* 10.30* 11.97* 6.37* 8.58*  CALCIUM 8.6*  --  8.3* 7.5* 8.9 7.5*  PHOS  --   --   --  8.7*  --  11.0*    GFR: Estimated Creatinine Clearance: 7.4 mL/min (A) (by C-G formula based on SCr of 8.58 mg/dL (H)). Recent Labs  Lab 11/05/21 2317 11/06/21 0441 11/07/21 0137 11/08/21 0225  PROCALCITON  --  1.28  --   --   WBC 3.1* 3.4* 4.2 4.9     Liver Function Tests: Recent Labs  Lab 11/05/21 2317 11/07/21 0137 11/07/21 0519 11/08/21 0225  AST 27  --  28  --   ALT 15  --  16  --   ALKPHOS 71  --  73  --   BILITOT 0.8  --  0.4  --   PROT 6.6  --  8.2*  --   ALBUMIN 3.4* 3.1* 4.0 3.4*    No results for input(s): LIPASE, AMYLASE in the last 168 hours. Recent Labs  Lab 11/06/21 2131 11/07/21 0953  AMMONIA 98* 42*     ABG    Component Value Date/Time   PHART 7.42 11/07/2021 1235   PCO2ART 43 11/07/2021 1235   PO2ART 90 11/07/2021 1235   HCO3 27.2 11/07/2021 1235   TCO2 31 11/05/2021 2322   ACIDBASEDEF 5.2 (H) 01/13/2015 0300   O2SAT 95.7 11/07/2021 1235      Coagulation Profile: No results for input(s): INR, PROTIME in the last 168 hours.  Cardiac Enzymes: No results for input(s): CKTOTAL, CKMB, CKMBINDEX, TROPONINI in the last 168 hours.  HbA1C: Hgb A1c MFr Bld  Date/Time Value Ref Range Status   11/06/2021 12:15 PM 3.9 (L) 4.8 - 5.6 % Final    Comment:    (NOTE) Pre diabetes:          5.7%-6.4%  Diabetes:              >6.4%  Glycemic control for   <7.0% adults with diabetes   12/20/2018 11:23 PM 4.6 (L) 4.8 - 5.6 % Final    Comment:    (NOTE) Pre diabetes:          5.7%-6.4% Diabetes:              >6.4% Glycemic control for   <7.0% adults with diabetes     CBG: Recent Labs  Lab 11/07/21 0753 11/07/21 1145 11/07/21 1732 11/07/21 1959 11/08/21 0416  GLUCAP 88 100* 113* 116* 134*     Review of Systems:   Encephalopathic  Past Medical History:  She,  has a past medical history of Anemia, Anxiety, ESRD (end stage renal disease) ([place]), Gastroparesis, Hypertension, Narcotic abuse ([place]), Non compliance with medical treatment, Renal disorder (2015), and Vision loss.   Surgical History:   Past Surgical History:  Procedure Laterality Date   A/V FISTULAGRAM Right 02/17/2021   Procedure: A/V FISTULAGRAM;  Surgeon: [name], MD;  Location: [facility];  Service: Cardiovascular;  Laterality: Right;   AV FISTULA PLACEMENT  11/17/2011   Procedure: ARTERIOVENOUS (AV) FISTULA CREATION;  Surgeon: Rosetta Posner, MD;  Location: [facility];  Service: Vascular;  Laterality: Right;   EYE SURGERY     HEMATOMA EVACUATION Right 02/25/2021   Procedure: EVACUATION HEMATOMA RIGHT CHEST;  Surgeon: Waynetta Sandy, MD;  Location: [facility];  Service: Vascular;  Laterality: Right;   INSERTION OF DIALYSIS CATHETER Right 12/08/2020   Procedure: INSERTION OF TUNNELED DIALYSIS CATHETER;  Surgeon: [name], MD;  Location: [facility];  Service: Vascular;  Laterality: Right;   IR REMOVAL TUN CV CATH W/O FL  05/03/2021   KIDNEY TRANSPLANT     NEPHRECTOMY TRANSPLANTED ORGAN     pancrease transplant     PERIPHERAL VASCULAR BALLOON ANGIOPLASTY Right 02/17/2021   Procedure: PERIPHERAL VASCULAR BALLOON ANGIOPLASTY;  Surgeon: [name], MD;  Location: [facility];  Service: Cardiovascular;  Laterality: Right;   REFRACTIVE SURGERY     REVISON OF ARTERIOVENOUS FISTULA Right 12/08/2020   Procedure: RIGHT ARM ARTERIOVENOUS FISTULA REVISION AND RESECTION;  Surgeon: [name], MD;  Location: [facility];  Service: Vascular;  Laterality: Right;   REVISON OF ARTERIOVENOUS FISTULA Right 02/25/2021   Procedure: RIGHT ARM ARTERIOVENOUS FISTULA REVISION WITH TRANSPOSITION OF CEPHALIC VEIN ON AXILLARY VEIN;  Surgeon: [name], MD;  Location: [facility];  Service: Vascular;  Laterality: Right;     Social History:   reports that she has quit smoking. Her smoking use included pipe. She has never used smokeless tobacco. She reports that she does not drink alcohol and does not use drugs.   Family History:  Her family history includes Hypertension in her father.   Allergies Allergies  Allergen Reactions   Iodinated Contrast Media     Other reaction(s): Unknown     Home Medications  Prior to Admission medications   Medication Sig Start Date End Date Taking? Authorizing Provider  amLODipine (NORVASC) 10 MG tablet Take 10 mg by mouth at bedtime. 09/19/18  Yes [provider]  CATAPRES-TTS-1 0.1 MG/24HR patch Place 0.1 mg onto the skin once a week. 09/26/21  Yes [provider]  ferric citrate (AURYXIA) 1 GM 210 MG(Fe) tablet Take 630 mg by mouth 3 (three) times daily with meals.   Yes [provider]  furosemide (LASIX) 20 MG tablet Take 40 mg by mouth 4 (four) times a week. Take 40 mg on Tues, Thurs, Sat, and Sun (non-dialysis days) 09/12/19  Yes [provider]  gabapentin (NEURONTIN) 100 MG capsule Take 100 mg by mouth 2 (two) times daily. 10/12/21  Yes [provider]  labetalol (NORMODYNE) 200 MG tablet Take 600 mg by mouth 2 (two) times daily. 09/12/19  Yes [provider]  LOKELMA 10 g PACK packet Take 10 g by mouth See admin instructions. Non dialysis days [facility]) 10/13/21   Yes [provider]  losartan (COZAAR) 100 MG tablet Take 100 mg by mouth at bedtime. 02/02/21  Yes [provider]  metoCLOPramide (REGLAN) 5 MG tablet Take 5 mg  by mouth every 6 (six) hours as needed for nausea or vomiting. 09/03/21  Yes [provider]  molnupiravir EUA (LAGEVRIO) 200 mg CAPS capsule Take 4 capsules by mouth 2 (two) times daily.   Yes [provider]  mycophenolate (MYFORTIC) 180 MG EC tablet Take 540 mg by mouth 2 (two) times daily.   Yes [provider]  oxyCODONE-acetaminophen (PERCOCET/ROXICET) 5-325 MG tablet Take 1-2 tablets by mouth every 6 (six) hours as needed for severe pain. Patient taking differently: Take 1 tablet by mouth 3 (three) times daily as needed for moderate pain. 03/22/21  Yes [name], MD  predniSONE (DELTASONE) 5 MG tablet Take 15 mg by mouth daily. 06/24/15  Yes [provider]  zolpidem (AMBIEN) 10 MG tablet Take 10 mg by mouth at bedtime as needed for sleep.   Yes [provider]  aspirin EC 81 MG tablet Take 81 mg by mouth daily.    [provider]  colchicine 0.6 MG tablet Take 0.6 mg by mouth 3 (three) times daily as needed (gout flares). 02/28/21   [provider]  cycloSPORINE modified (NEORAL) 100 MG capsule Take 200 mg by mouth 2 (two) times daily.    [provider]  cycloSPORINE modified (NEORAL) 25 MG capsule Take 25 mg by mouth every evening.    [provider]  diphenhydrAMINE (BENADRYL) 25 MG tablet Take 25 mg by mouth every 6 (six) hours as needed for itching.    [provider]  heparin 1000 unit/mL SOLN injection Heparin Sodium (Porcine) 1,000 Units/mL Catheter Lock Arterial 12/22/20 12/21/21  [provider]  HYDROcodone-acetaminophen (NORCO) 5-325 MG tablet Take 1 tablet by mouth every 6 (six) hours as needed for up to 12 doses for severe pain. Patient not taking: No sig reported 03/02/21   Corena Herter, PA-C   lidocaine-prilocaine (EMLA) cream Apply 1 application topically daily as needed (port access). 07/09/20   [provider]  Methoxy PEG-Epoetin Beta (MIRCERA IJ) Mircera 01/03/21 01/02/22  [provider]  naloxone [facility]) nasal spray 4 mg/0.1 mL Place 1 spray into the nose as needed (opioid overdose).    [provider]  ondansetron (ZOFRAN) 4 MG tablet Take 4 mg by mouth 2 (two) times daily as needed for nausea or vomiting.    [provider]     Critical care time:     Gaylan Gerold, DO

## 2021-11-08 NOTE — Procedures (Signed)
Lumbar Puncture Procedure Note ? ?[name] [name]  ?[id number]  ?12-18-1980 ? ?Date:11/08/21  ?Time:8:29 AM  ? ?Provider Performing:Maryrose Colvin  ? ?Procedure: Lumbar Puncture (16606) ? ?Indication(s) ?Rule out meningitis ? ?Consent ?Risks of the procedure as well as the alternatives and risks of each were explained to the patient and/or caregiver.  Consent for the procedure was obtained and is signed in the bedside chart ? ?Anesthesia ?Topical only with 1% lidocaine  ? ? ?Time Out ?Verified patient identification, verified procedure, site/side was marked, verified correct patient position, special equipment/implants available, medications/allergies/relevant history reviewed, required imaging and test results available. ? ? ?Sterile Technique ?Maximal sterile technique including sterile barrier drape, hand hygiene, sterile gown, sterile gloves, mask, hair covering. ? ? ? ?Procedure Description ?Using palpation, approximate location of L3-L4 space identified.   Lidocaine used to anesthetize skin and subcutaneous tissue overlying this area.  A 20g spinal needle was then used to access the subarachnoid space. Opening pressure: 13 cm H2O. Closing pressure:Not obtained. Clear CSF obtained. ? ?Complications/Tolerance ?None; patient tolerated the procedure well. ? ? ?EBL ?Minimal ? ? ?Specimen(s) ?CSF ? ? ?

## 2021-11-09 ENCOUNTER — Inpatient Hospital Stay (HOSPITAL_COMMUNITY): Payer: Medicare Other

## 2021-11-09 DIAGNOSIS — G9340 Encephalopathy, unspecified: Secondary | ICD-10-CM | POA: Diagnosis not present

## 2021-11-09 LAB — GLUCOSE, CAPILLARY
Glucose-Capillary: 106 mg/dL — ABNORMAL HIGH (ref 70–99)
Glucose-Capillary: 109 mg/dL — ABNORMAL HIGH (ref 70–99)
Glucose-Capillary: 117 mg/dL — ABNORMAL HIGH (ref 70–99)
Glucose-Capillary: 126 mg/dL — ABNORMAL HIGH (ref 70–99)
Glucose-Capillary: 148 mg/dL — ABNORMAL HIGH (ref 70–99)
Glucose-Capillary: 84 mg/dL (ref 70–99)

## 2021-11-09 LAB — CBC WITH DIFFERENTIAL/PLATELET
Abs Immature Granulocytes: 0.03 10*3/uL (ref 0.00–0.07)
Basophils Absolute: 0 10*3/uL (ref 0.0–0.1)
Basophils Relative: 0 %
Eosinophils Absolute: 0 10*3/uL (ref 0.0–0.5)
Eosinophils Relative: 0 %
HCT: 41.2 % (ref 36.0–46.0)
Hemoglobin: 13.5 g/dL (ref 12.0–15.0)
Immature Granulocytes: 1 %
Lymphocytes Relative: 7 %
Lymphs Abs: 0.3 10*3/uL — ABNORMAL LOW (ref 0.7–4.0)
MCH: 30 pg (ref 26.0–34.0)
MCHC: 32.8 g/dL (ref 30.0–36.0)
MCV: 91.6 fL (ref 80.0–100.0)
Monocytes Absolute: 0.1 10*3/uL (ref 0.1–1.0)
Monocytes Relative: 2 %
Neutro Abs: 4.5 10*3/uL (ref 1.7–7.7)
Neutrophils Relative %: 90 %
Platelets: 54 10*3/uL — ABNORMAL LOW (ref 150–400)
RBC: 4.5 MIL/uL (ref 3.87–5.11)
RDW: 13.1 % (ref 11.5–15.5)
WBC: 5 10*3/uL (ref 4.0–10.5)
nRBC: 0 % (ref 0.0–0.2)

## 2021-11-09 LAB — URINE CULTURE

## 2021-11-09 LAB — RENAL FUNCTION PANEL
Albumin: 2.9 g/dL — ABNORMAL LOW (ref 3.5–5.0)
Anion gap: 24 — ABNORMAL HIGH (ref 5–15)
BUN: 96 mg/dL — ABNORMAL HIGH (ref 6–20)
CO2: 13 mmol/L — ABNORMAL LOW (ref 22–32)
Calcium: 7.3 mg/dL — ABNORMAL LOW (ref 8.9–10.3)
Chloride: 105 mmol/L (ref 98–111)
Creatinine, Ser: 10 mg/dL — ABNORMAL HIGH (ref 0.44–1.00)
GFR, Estimated: 5 mL/min — ABNORMAL LOW (ref >60)
Glucose, Bld: 166 mg/dL — ABNORMAL HIGH (ref 70–99)
Phosphorus: 10.2 mg/dL — ABNORMAL HIGH (ref 2.5–4.6)
Potassium: 5 mmol/L (ref 3.5–5.1)
Sodium: 142 mmol/L (ref 135–145)

## 2021-11-09 LAB — HEPATIC FUNCTION PANEL
ALT: 25 U/L (ref 0–44)
AST: 44 U/L — ABNORMAL HIGH (ref 15–41)
Albumin: 2.9 g/dL — ABNORMAL LOW (ref 3.5–5.0)
Alkaline Phosphatase: 58 U/L (ref 38–126)
Bilirubin, Direct: 0.1 mg/dL (ref 0.0–0.2)
Indirect Bilirubin: 0.4 mg/dL (ref 0.3–0.9)
Total Bilirubin: 0.5 mg/dL (ref 0.3–1.2)
Total Protein: 7 g/dL (ref 6.5–8.1)

## 2021-11-09 LAB — C-REACTIVE PROTEIN: CRP: 3.3 mg/dL — ABNORMAL HIGH (ref <1.0)

## 2021-11-09 LAB — HSV 1/2 PCR, CSF
HSV-1 DNA: NEGATIVE
HSV-2 DNA: NEGATIVE

## 2021-11-09 LAB — FERRITIN: Ferritin: 5273 ng/mL — ABNORMAL HIGH (ref 11–307)

## 2021-11-09 IMAGING — CT CT HEAD W/O CM
5 of 10 series · 17 of 47 positions shown, 18 images · non-contrast
Comparison: Brain MRI from earlier today

CLINICAL DATA: Follow-up intracranial hemorrhage



[Series 3: head wo · axial · 0.44mm/px · z∈[-100,+45]mm · 3 of 30 slices shown, 4 images]
[im 1/30  brain]
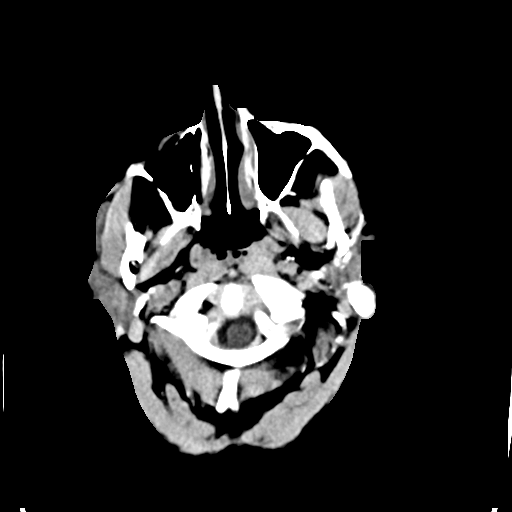
[im 1/30  bone]
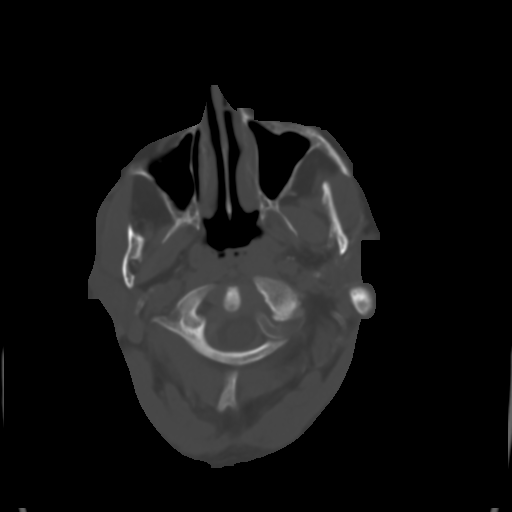
[im 15/30  brain]
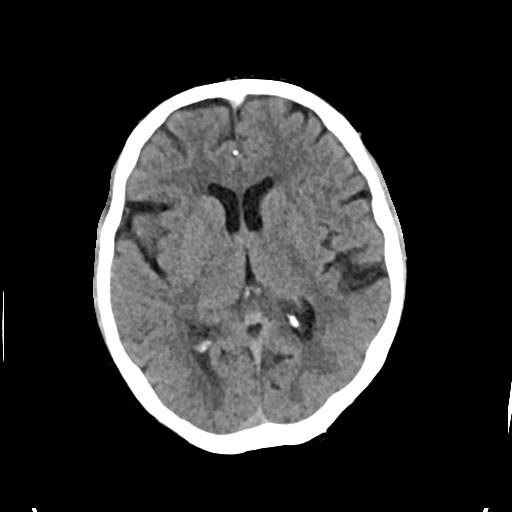
[im 30/30  brain]
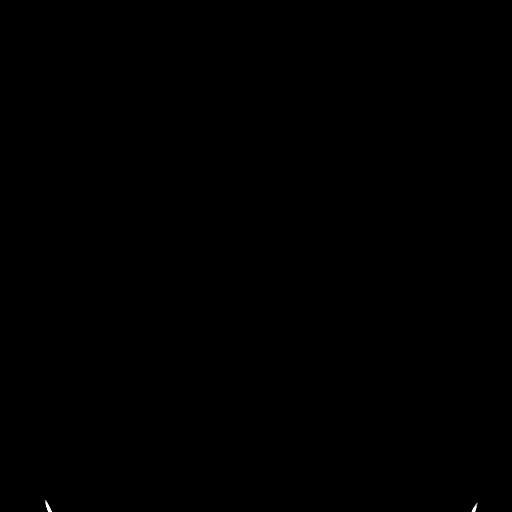

[Series 4: head bone · axial · 0.44mm/px · z∈[-76,+22]mm · 5 of 75 slices shown]
[im 13/75  bone]
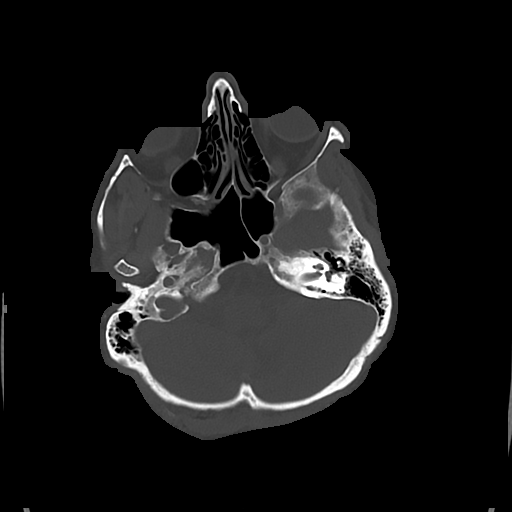
[im 25/75  bone]
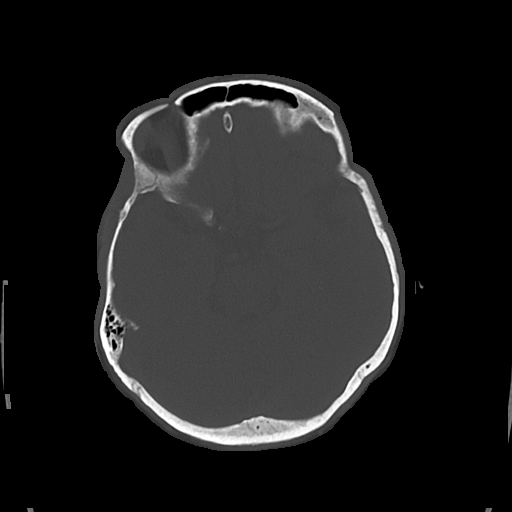
[im 38/75  bone]
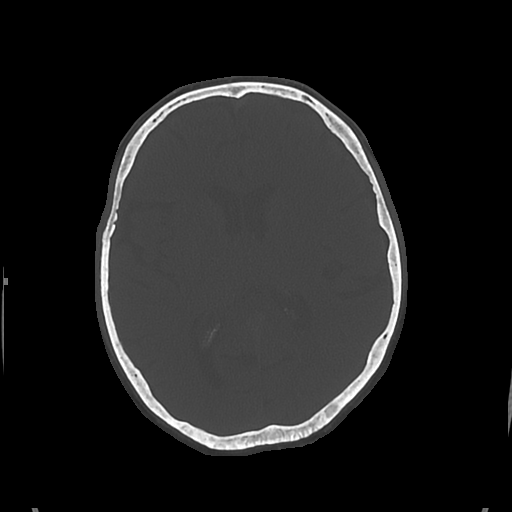
[im 50/75  bone]
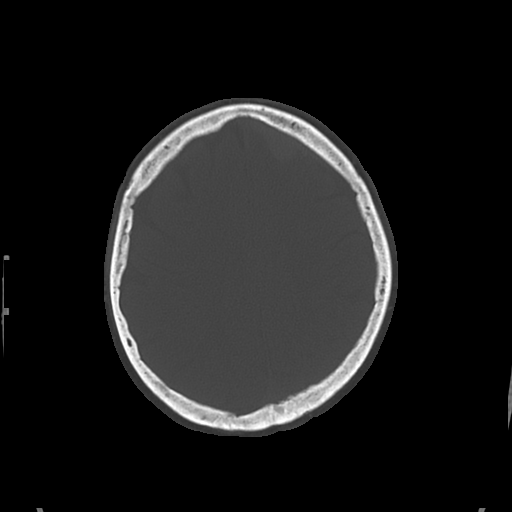
[im 62/75  bone]
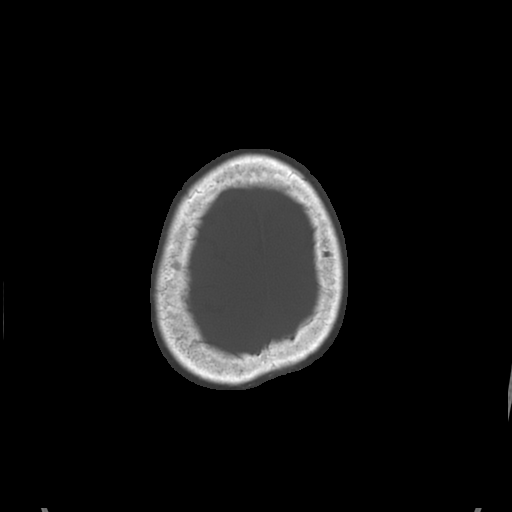

[Series 5: cor soft · coronal · 0.28mm/px · 3 of 60 slices shown]
[im 15/60  brain]
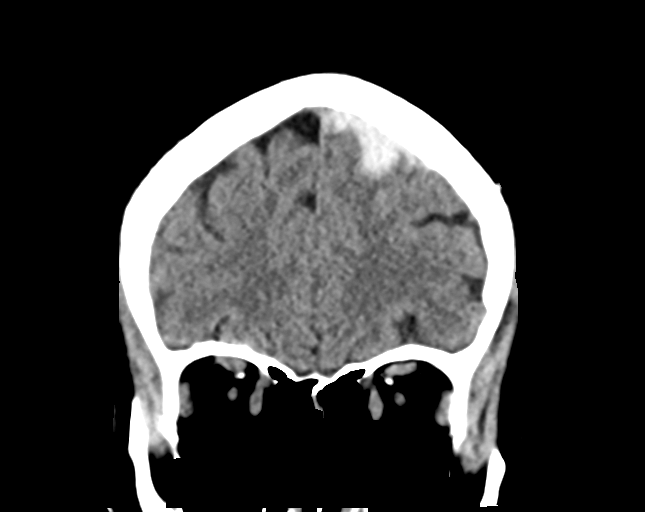
[im 30/60  brain]
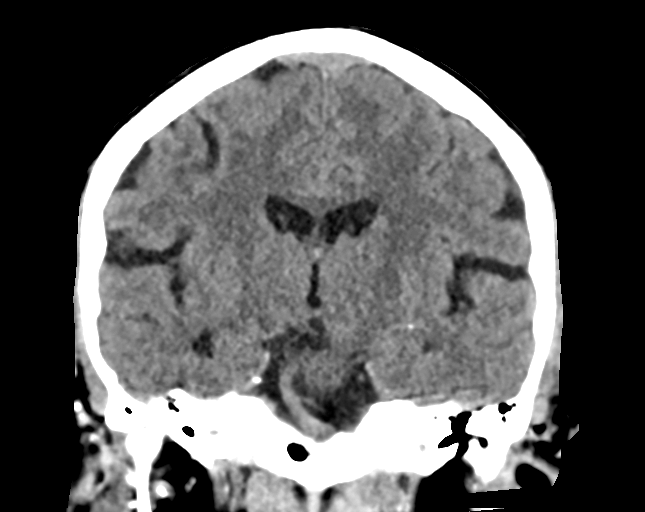
[im 45/60  brain]
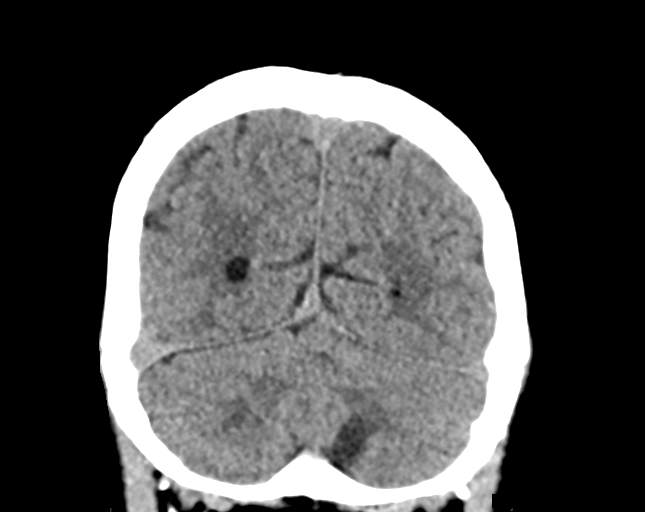

[Series 8: head bone true ax · axial · 0.35mm/px · z∈[-68,+3]mm · 4 of 72 slices shown]
[im 12/72  bone]
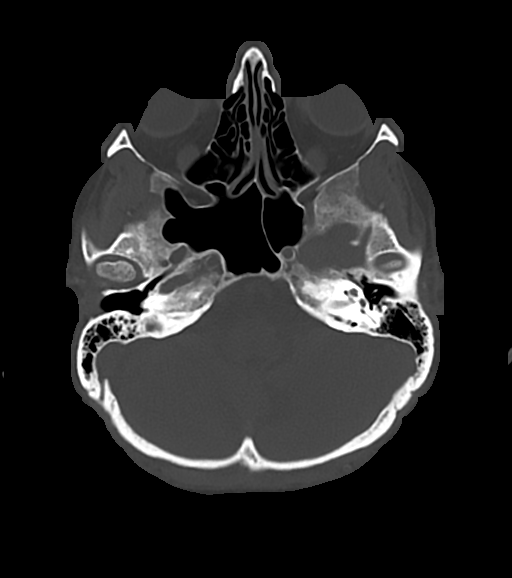
[im 24/72  bone]
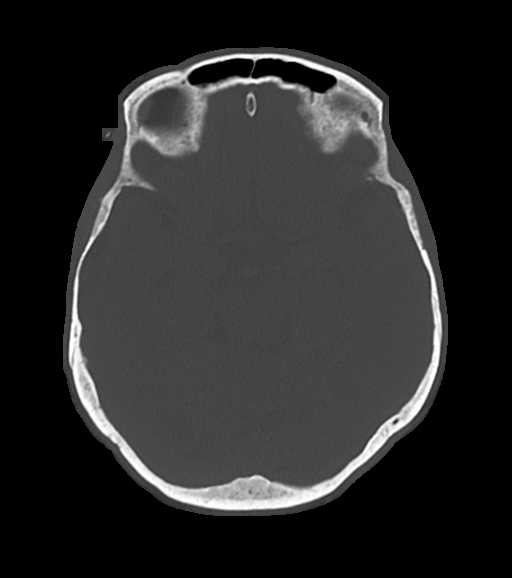
[im 36/72  bone]
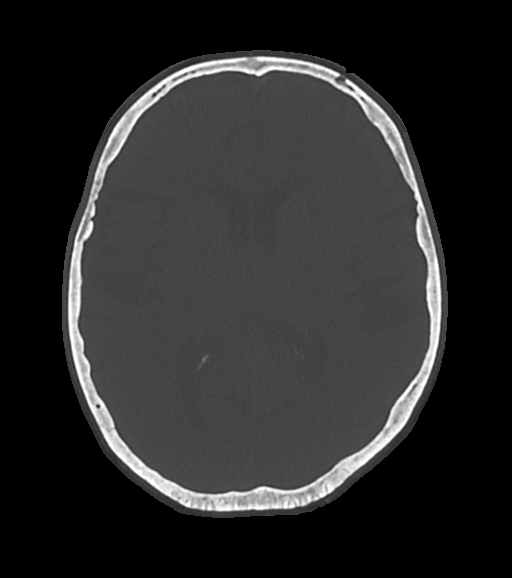
[im 48/72  bone]
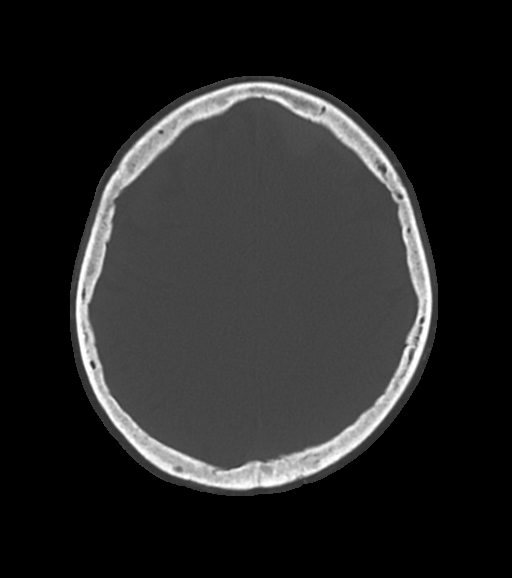

[Series 14: sag soft · sagittal · 0.29mm/px · 2 of 58 slices shown]
[im 20/58  brain]
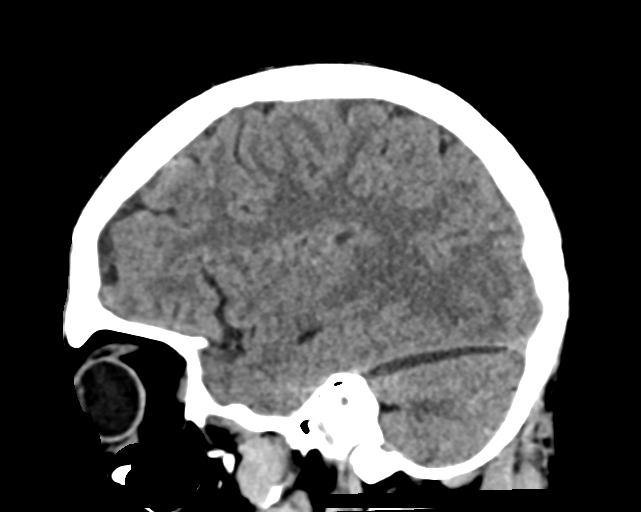
[im 39/58  brain]
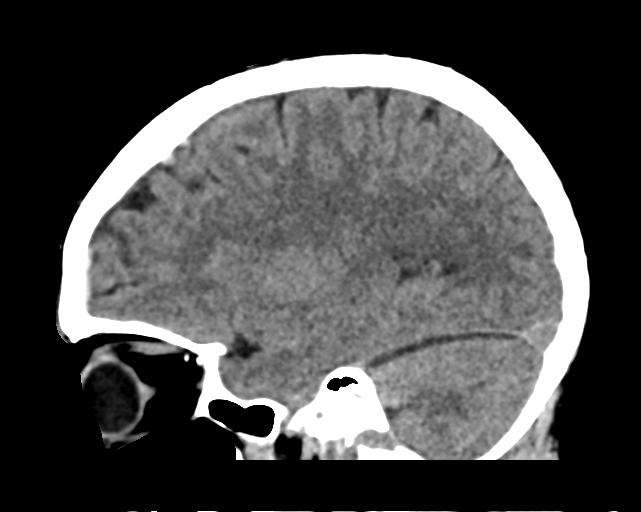

[17 of 47 positions shown; findings below may reference images not displayed]

FINDINGS: Brain: The extra-axial anterior left frontal abnormality is
confirmed as intracranial hemorrhage in measures up to 8 mm in
maximal thickness with mild local mass effect. Agree with the
subdural location.

Known bilateral para median cerebellar infarcts. No hydrocephalus or
shift.

Vascular: Premature arterial calcification.

Skull: Normal. Negative for fracture or focal lesion.

Sinuses/Orbits: No acute finding.
IMPRESSION: 1. Confirmed anterior left frontal subdural hemorrhage measuring up
to 8 mm in thickness.
2. Known small cerebellar infarcts.

## 2021-11-09 IMAGING — MR MR HEAD W/O CM
7 of 8 series · 39 of 48 positions shown · non-contrast
Comparison: None.

EXAM:
MRI HEAD WITHOUT CONTRAST
TECHNIQUE: Multiplanar, multiecho pulse sequences of the brain and surrounding
structures were obtained without intravenous contrast.

[Series 3: DWI · axial · 3.0mm · 1.09mm/px · z∈[-74,+76]mm · 9 of 102 slices shown (1 of 4)]
[im 1/102]
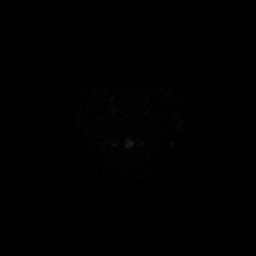
[im 15/102]
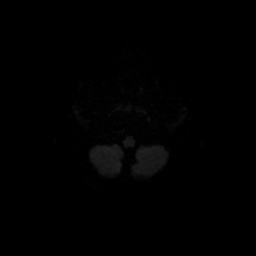
[im 29/102]
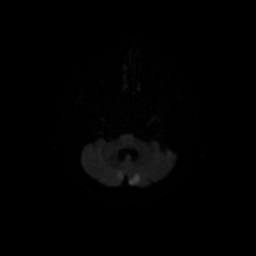
[im 44/102]
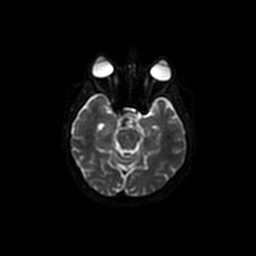
[im 51/102]
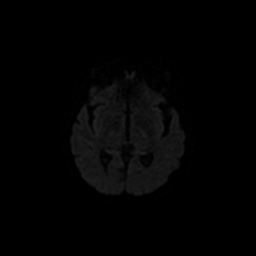
[im 58/102]
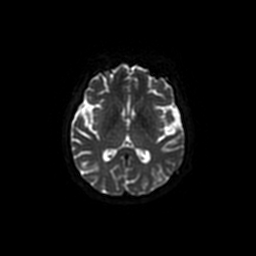
[im 73/102]
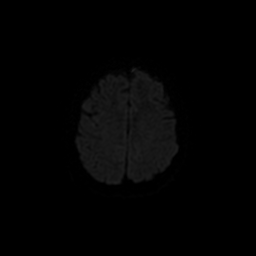
[im 87/102]
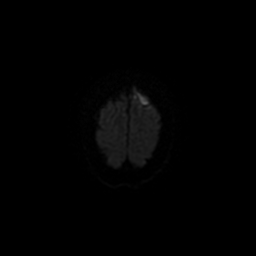
[im 102/102]
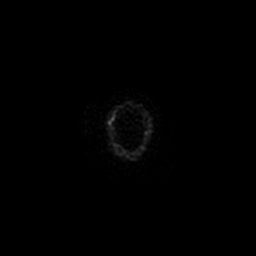

[Series 4: DWI · coronal · 5.0mm · 1.09mm/px · 9 of 68 slices shown (2 of 4)]
[im 1/68]
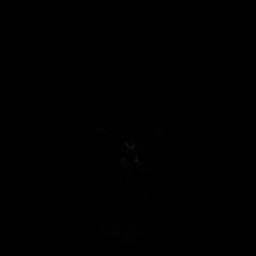
[im 9/68]
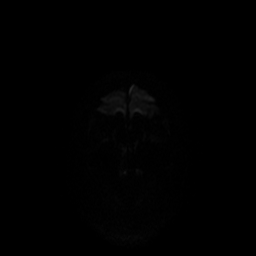
[im 17/68]
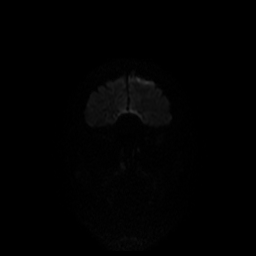
[im 26/68]
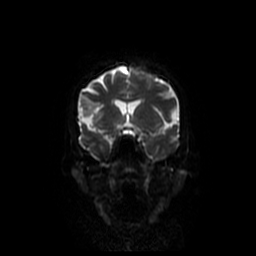
[im 34/68]
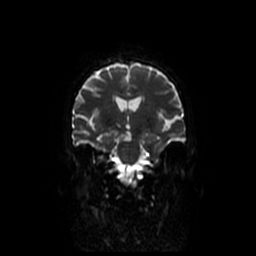
[im 42/68]
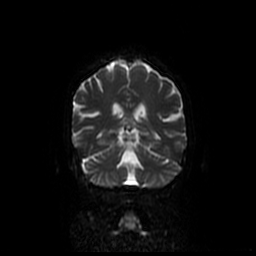
[im 51/68]
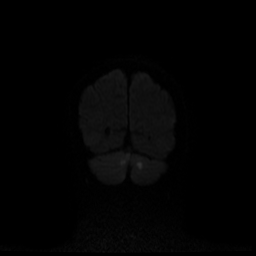
[im 59/68]
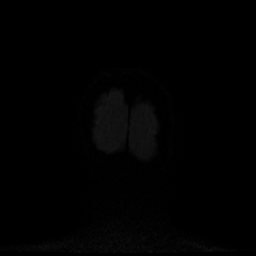
[im 68/68]
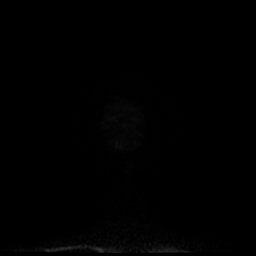

[Series 5: T2 · axial · 5.0mm · 0.43mm/px · z∈[-72,+66]mm · 3 of 24 slices shown]
[im 1/24]
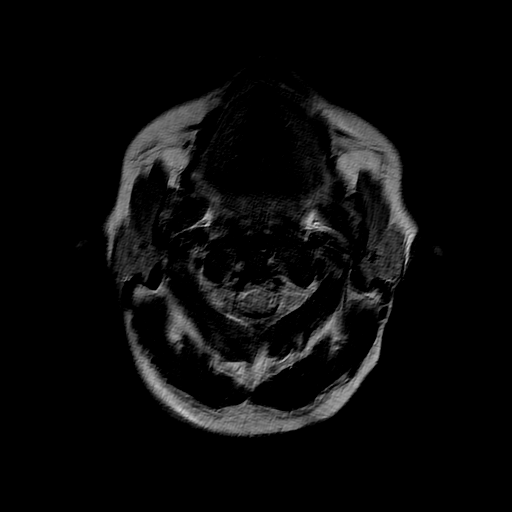
[im 12/24]
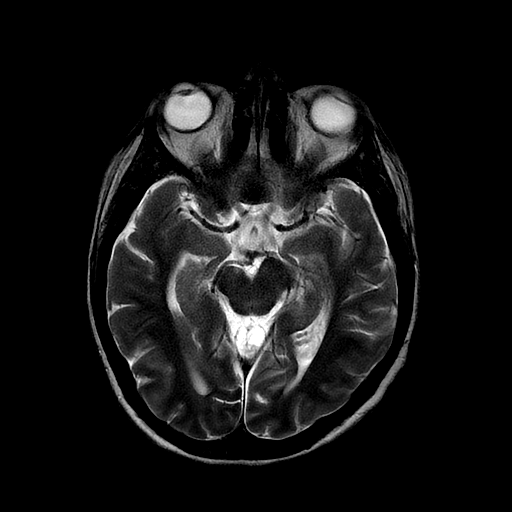
[im 24/24]
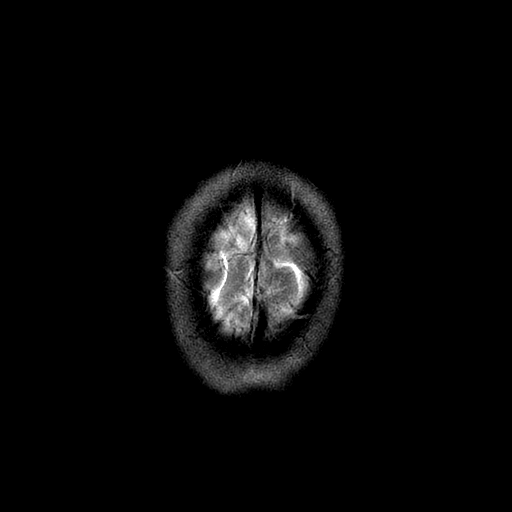

[Series 6: FLAIR · axial · 5.0mm · 0.43mm/px · z∈[-72,+66]mm · 3 of 24 slices shown]
[im 1/24]
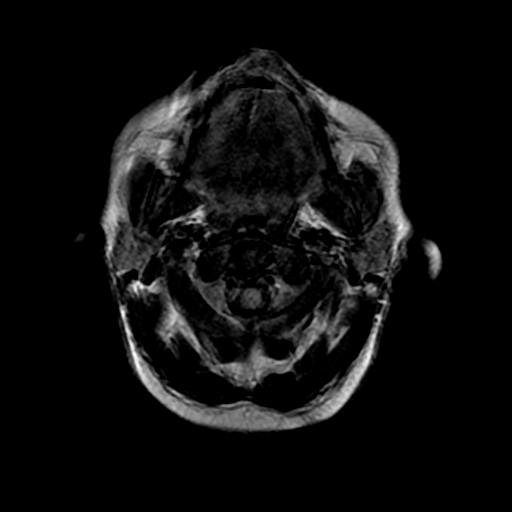
[im 12/24]
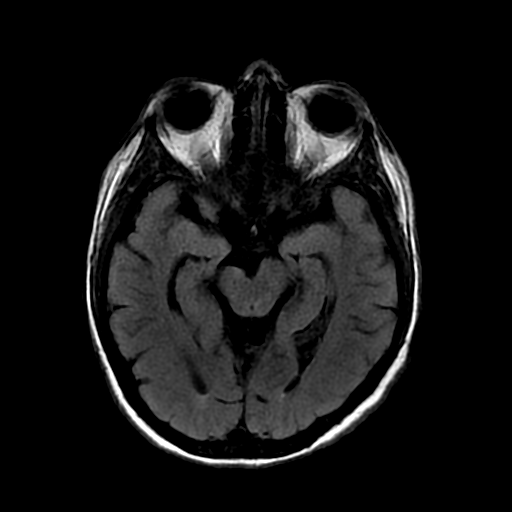
[im 24/24]
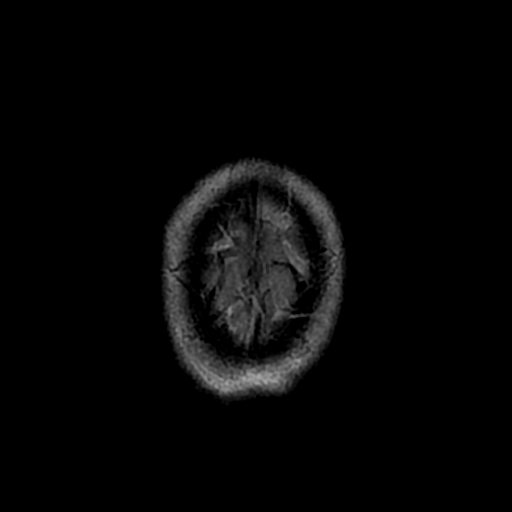

[Series 7: T1 · sagittal · 5.0mm · 0.47mm/px · 3 of 23 slices shown]
[im 1/23]
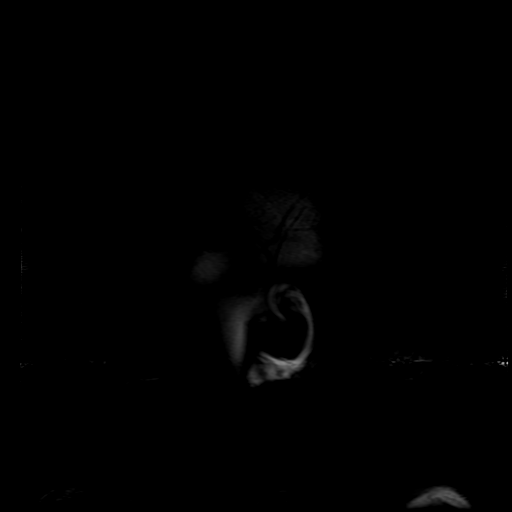
[im 12/23]
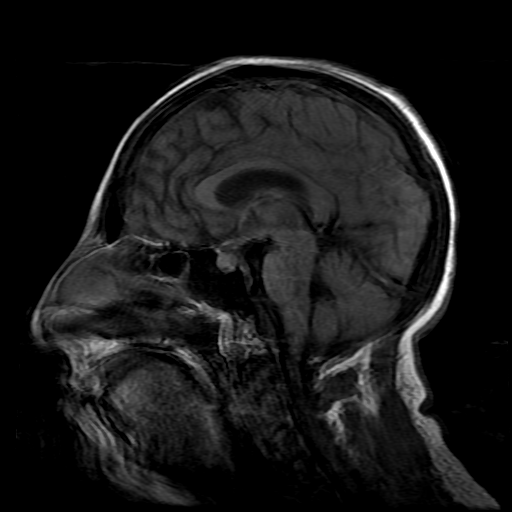
[im 23/23]
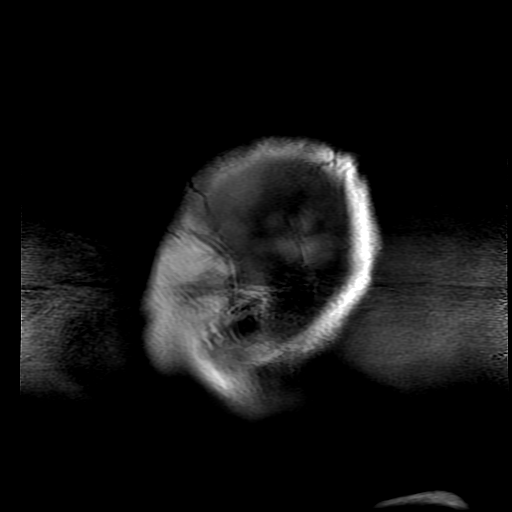

[Series 300: DWI · axial · 3.0mm · 1.09mm/px · z∈[-74,+76]mm · 7 of 51 slices shown (3 of 4)]
[im 1/51]
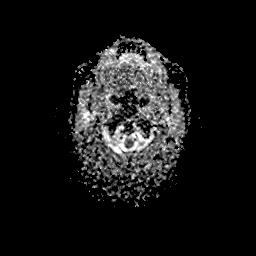
[im 9/51]
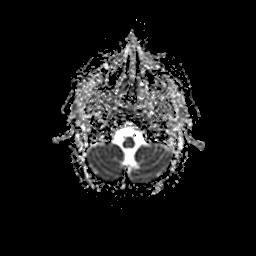
[im 17/51]
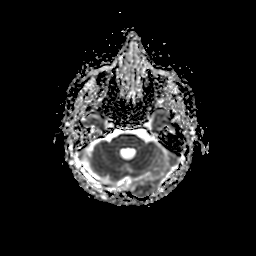
[im 26/51]
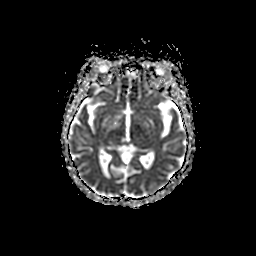
[im 34/51]
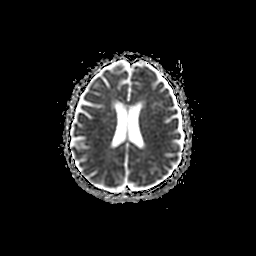
[im 42/51]
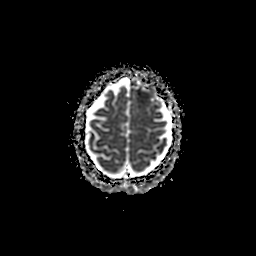
[im 51/51]
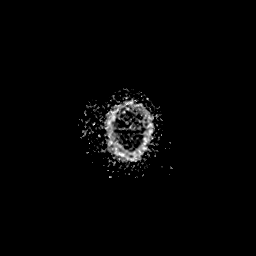

[Series 400: DWI · coronal · 5.0mm · 1.09mm/px · 5 of 34 slices shown (4 of 4)]
[im 1/34]
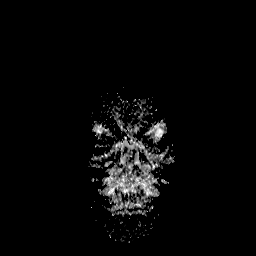
[im 9/34]
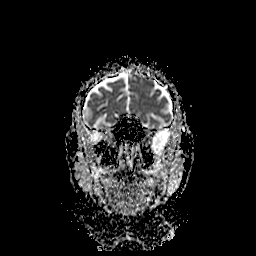
[im 17/34]
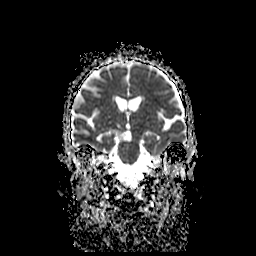
[im 25/34]
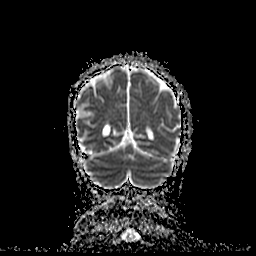
[im 34/34]
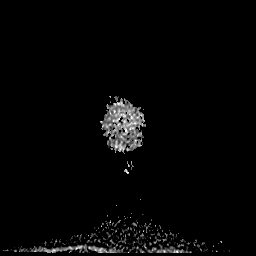

[39 of 48 positions shown; findings below may reference images not displayed]

FINDINGS: Motion limited study. Also, incomplete study due to patient
intolerance. Axial T1 and coronal T2 sequences were not performed.

Brain: Small foci of restricted diffusion in the posterior
paramidline cerebellum, concerning for acute infarcts (series 3,
image 15). Additional punctate focus of restricted diffusion in the
splenium of the corpus callosum (series 3, image 29). Along the left
frontal convexity there is an approximately 1.0 x 2.5 cm lobulated
area of T2/FLAIR hyperintensity with associated susceptibility
artifact, which may be extra-axial. This area is T1 isointense. No
significant mass effect. No midline shift. No hydrocephalus. Small
remote infarcts in the right pons, inferior cerebellum, and right
basal ganglia.

Vascular: Major arterial flow voids are maintained at the skull
base.

Skull and upper cervical spine: Normal marrow signal.

Sinuses/Orbits: Clear sinuses.  Unremarkable orbits.

Other: Small bilateral mastoid effusions.
IMPRESSION: Incomplete and motion limited study, as detailed above.

1. Along the left frontal convexity there is a 1.0 x 2.5 cm area of
T2/FLAIR hyperintensity and susceptibility artifact, which appears
to be extra-axial. This finding is most suspicious for recent
hemorrhage, probably subdural in location. No significant mass
effect. A repeat CT head could assess for hyperdense blood products
in this region. If the CT head does not confirm hemorrhage, then
follow-up postcontrast MRI imaging is recommended to exclude a mass
(such as a meningioma).
2. Small acute infarcts in the posterior paramidline cerebellum
bilaterally.
3. Additional punctate focus of restricted diffusion in the splenium
of the corpus callosum, which likely represents a cytotoxic lesion
of the corpus callosum (CLOCC). This has a broad differential that
includes the sequela of seizures, metabolic disturbance, infection,
or sequela of drugs/toxins.
4. Small remote infarcts in the right pons, inferior cerebellum, and
right basal ganglia.

These results will be called to the ordering clinician or
representative by the Radiologist Assistant, and communication
documented in the PACS or [REDACTED].

## 2021-11-09 IMAGING — DX DG CHEST 1V PORT
1 series · 1 of 1 positions shown · non-contrast
Comparison: [DATE].

CLINICAL DATA: Shortness of breath.

EXAM:
PORTABLE CHEST 1 VIEW

[chest]
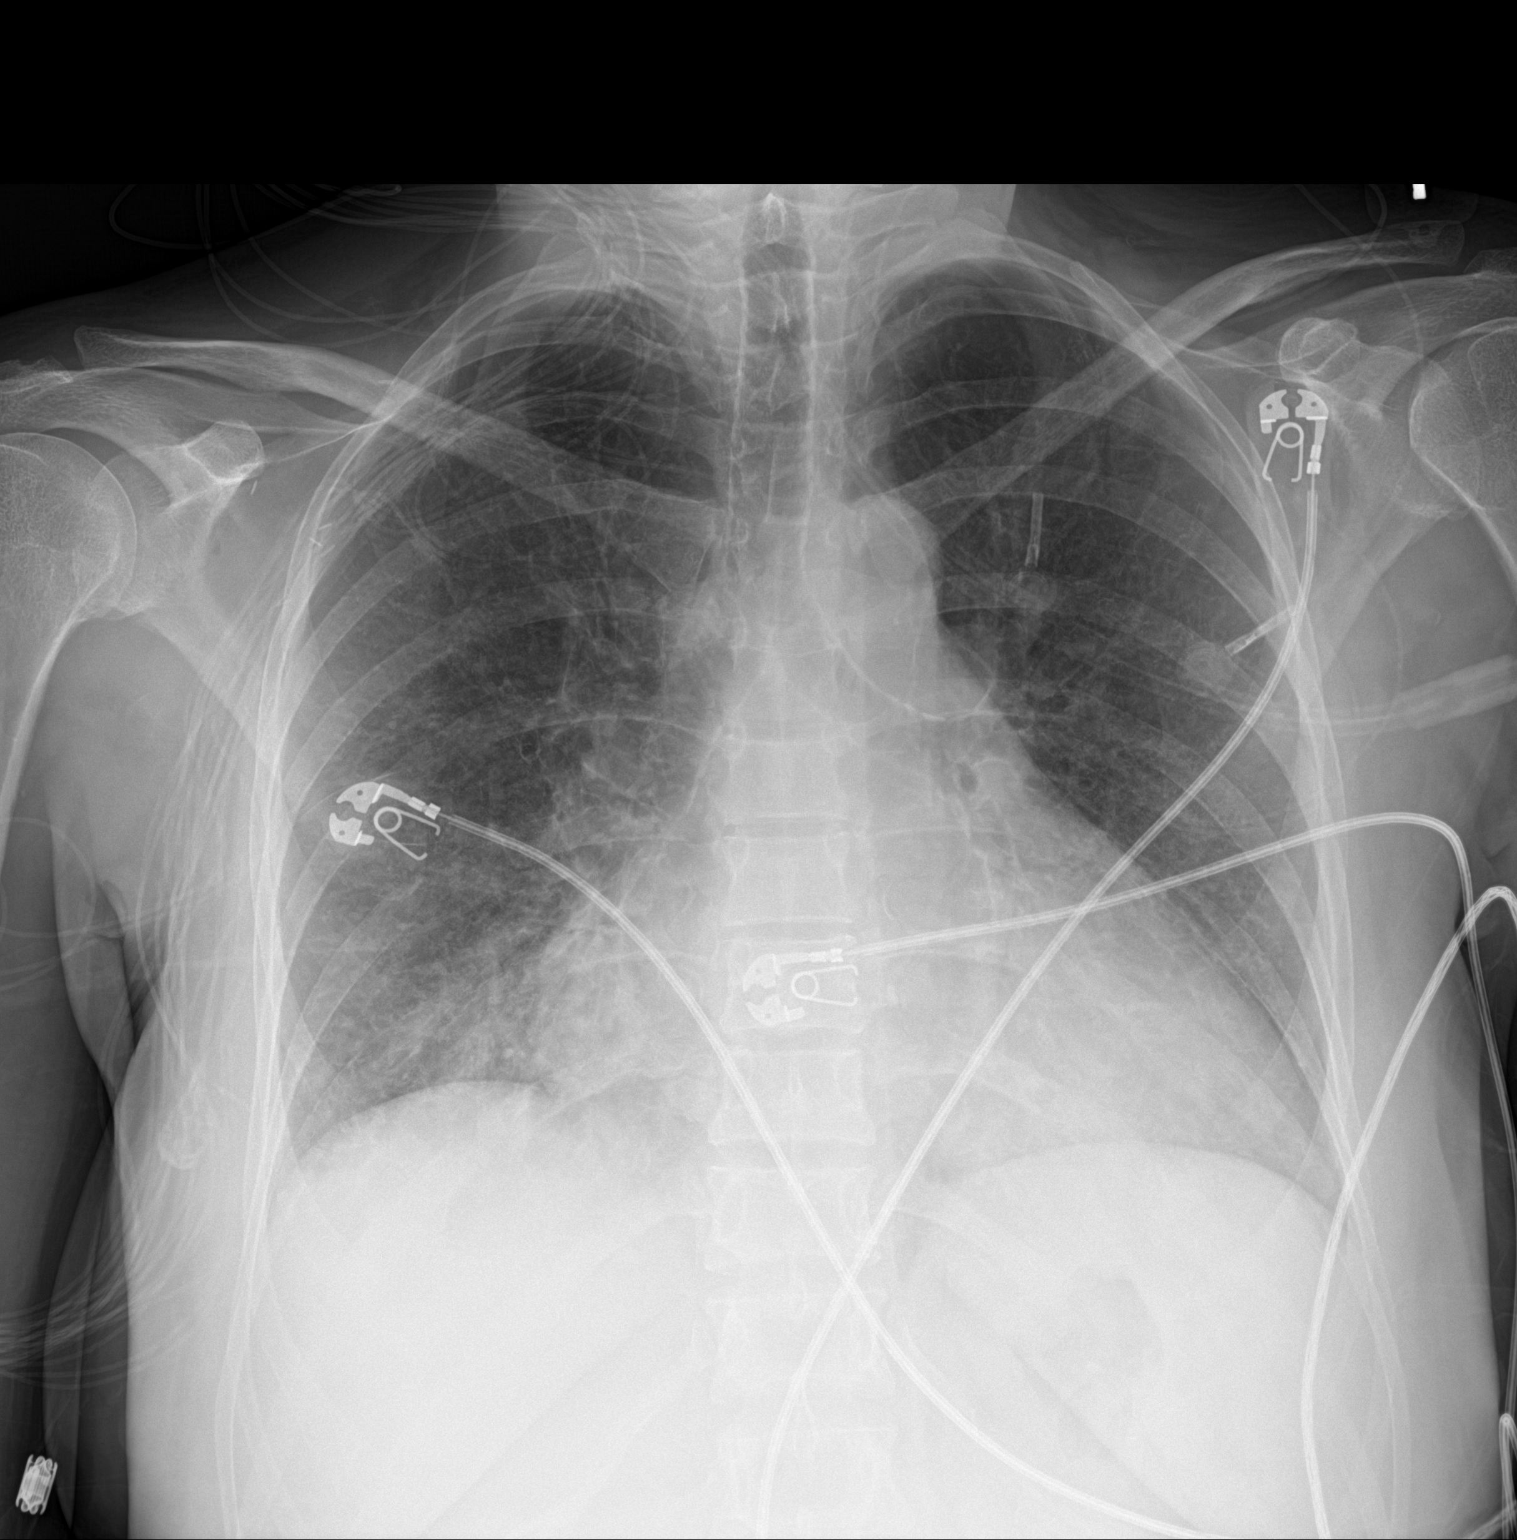

[1 of 1 positions shown; findings below may reference images not displayed]

FINDINGS: Stable cardiomegaly. Stable bibasilar opacities are noted. Bony
thorax is unremarkable.
IMPRESSION: Stable bibasilar opacities are noted concerning for possible edema,
atelectasis or possibly infiltrates.

## 2021-11-09 MED ORDER — FAMOTIDINE 20 MG PO TABS
20.0000 mg | ORAL_TABLET | Freq: Every day | ORAL | Status: DC
  Administered 2021-11-09 – 2021-11-16 (×8): 20 mg via ORAL
  Filled 2021-11-09 (×8): qty 1

## 2021-11-09 MED ORDER — LACTULOSE 10 GM/15ML PO SOLN
30.0000 g | Freq: Two times a day (BID) | ORAL | Status: DC
  Administered 2021-11-09: 30 g
  Filled 2021-11-09: qty 45

## 2021-11-09 MED ORDER — PREDNISONE 5 MG PO TABS
15.0000 mg | ORAL_TABLET | Freq: Every day | ORAL | Status: DC
  Administered 2021-11-10 – 2021-11-17 (×8): 15 mg via ORAL
  Filled 2021-11-09: qty 2
  Filled 2021-11-09: qty 1
  Filled 2021-11-09: qty 2
  Filled 2021-11-09: qty 1
  Filled 2021-11-09 (×2): qty 2
  Filled 2021-11-09 (×2): qty 1

## 2021-11-09 MED ORDER — ACETAMINOPHEN 325 MG PO TABS
650.0000 mg | ORAL_TABLET | Freq: Four times a day (QID) | ORAL | Status: DC | PRN
  Administered 2021-11-09 – 2021-11-10 (×2): 650 mg via ORAL
  Filled 2021-11-09 (×2): qty 2

## 2021-11-09 MED ORDER — SODIUM CHLORIDE 0.9 % IV SOLN
2.0000 g | INTRAVENOUS | Status: AC
  Administered 2021-11-09 – 2021-11-11 (×3): 2 g via INTRAVENOUS
  Filled 2021-11-09 (×2): qty 20

## 2021-11-09 MED ORDER — DEXTROSE 5 % IV SOLN
260.0000 mg | Freq: Once | INTRAVENOUS | Status: AC
  Administered 2021-11-09: 260 mg via INTRAVENOUS
  Filled 2021-11-09: qty 5.2

## 2021-11-09 MED ORDER — DEXTROSE 5 % IV SOLN
260.0000 mg | INTRAVENOUS | Status: DC
  Filled 2021-11-09: qty 5.2

## 2021-11-09 MED ORDER — LORAZEPAM 2 MG/ML IJ SOLN
1.0000 mg | INTRAMUSCULAR | Status: AC | PRN
  Administered 2021-11-09: 1 mg via INTRAVENOUS
  Filled 2021-11-09: qty 1

## 2021-11-09 NOTE — Progress Notes (Signed)
Signed out to [name]. They will take over patient tomorrow. Transferred patient to Tele [facility].  ?

## 2021-11-09 NOTE — Progress Notes (Signed)
MRI brain completed ? ?Incomplete study ?Within the limitations of the incomplete study there is a left frontal convexity area that appears to be consistent with a possibility of blood products versus a meningioma.  A contrast-enhanced study is recommended but due to her deranged function, that might be difficult. ?Also seen a cytotoxic lesion of the corpus callosum which includes a broad differential including seizures and metabolic disturbances-given her deranged renal function, that is likely the cause. ? ?As a next up, I would recommend getting a CT head and neurosurgical consultation on this. ? ?-- ?Amie Portland, MD ?Neurologist ?[name] ?Pager: [phone] ? ? ? ?

## 2021-11-09 NOTE — Progress Notes (Addendum)
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  Hospital course complicated by progressive obtundation prompting ICU evaluation.  Patient currently unable to provide any history.  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  Pancreas graft still apparently working. -History of narcotic abuse -Questionable compliance with rejection meds   Significant Hospital Events: Including procedures, antibiotic start and stop dates in addition to other pertinent events   3/5 admitted 3/6 Neuro consult, ICU transfer  Interim History / Subjective:  Patient appears more alert and awake today.  She is able to answer questions appropriately. Endorses abdominal pain but not unsure what triggered the pain. Denies nausea or vomiting. She states that she is not back to normal, still feels bad. Endorses UE tremors with movement.   Objective   Blood pressure (!) 151/55, pulse 77, temperature 98.3 F (36.8 C), temperature source Oral, resp. rate 18, height '5\' 4"'$  (1.626 m), weight 53.4 kg, SpO2 98 %.        Intake/Output Summary (Last 24 hours) at 11/09/2021 0744 Last data filed at 11/09/2021 0500 Gross per 24 hour  Intake 1546.81 ml  Output 800 ml  Net 746.81 ml    Filed Weights   11/06/21 0221 11/07/21 0300 11/07/21 0700  Weight: 61.2 kg 56.5 kg 53.4 kg    Examination: General: ill appearing woman, appears older than stated age, alert, awake and oriented to person and place HENT: MM dry, sclera anicteric Lungs: In no acute respiratory distress.  No wheezing.  Cardiovascular:  RRR, no murmur Abd: diffuse mild tenderness to palpation. A midline scar noted.  Extremities: No LE edema.  Neuro: AO x 2. Asterixis persists   Resolved Hospital Problem list   N/A  Assessment & Plan:   Acute metabolic/septic encephalopathy Likely multifactorial from COVID infection, uremia and hyperammonemia.  SSF results not consistent with meningitis.  CT head was negative for acute abnormality but showed old infarcts.  Pending MRI to rule out PRESS or recent stroke. -CXR showed slight worsening of Right hilar opacity. Blood culture and CSF culture negative to date. MRSA swab negative. Will stop Vanc and Zosyn. Switch to Ceftriaxone for superimposed PNA in the setting of covid -Continue Acyclovir until HSV results come back -EEG initially was concern for epileptiform discharge but no seizure overnight. Will discontinue EEG. -Correct uremia with dialysis per nephrology -Correct hyperammonemia with lactulose. Cut back Lactulose to BID. Unclear cause of hyperammonemia. LFT unremarkable. RUQ show normal liver parenchymal. Suspect 2/2 Cyclosporine. Will continue holding her immunosuppressive therapy.   Sepsis - resolved Afebrile overnight. WBC trending up in the setting of steroid. No obvious source of infection beside Covid. Currently treating for possible superimposed PNA. Blood pressure remains within good range.  -Continue hold HTN meds. Can resume when BP consistently elevated -Continue broad-spectrum antibiotics -Holding immunosuppressive medications. -Continue stress dose steroids for 3 days and transition to home Prednisone   ESRD on HD Metabolic acidosis 2/2 uremia HD today as schedule  COVID-19 infection Continue Lagevrio and steroids No baricitinib in the setting of possible bacterial infection Satting well on RA  Chronic thrombocytopenia Worsening thrombocytopenia in the setting of  acute illness. -Holding immunosuppressive therapies -CBC  daily  Gastroparesis Continue Reglan  Type 1 diabetes Recent A1c of 3.9.  Can be falsely low in the setting of ESRD.  CBG remains within good range.  No insulin indicated.  Avoid hypoglycemia  Best Practice (right click and "Reselect all SmartList Selections" daily)   Diet/type: renal DVT prophylaxis: heparin GI prophylaxis: N/A Lines: N/A Foley:  N/A Code Status:  full code Last date of multidisciplinary goals of care discussion   Labs   CBC: Recent Labs  Lab 11/05/21 2317 11/05/21 2322 11/06/21 0441 11/07/21 0137 11/08/21 0225 11/09/21 0109  WBC 3.1*  --  3.4* 4.2 4.9 5.0  NEUTROABS 2.3  --   --  3.4 4.2 4.5  HGB 12.5 13.3 11.9* 11.8* 13.5 13.5  HCT 38.4 39.0 36.5 36.3 42.2 41.2  MCV 94.1  --  93.6 93.8 93.6 91.6  PLT 79*  --  75* 64* 65* 54*     Basic Metabolic Panel: Recent Labs  Lab 11/06/21 0441 11/07/21 0137 11/07/21 0519 11/08/21 0225 11/09/21 0109  NA 140 138 138 139 142  K 5.5* >7.5* 3.9 4.7 5.0  CL 95* 96* 95* 96* 105  CO2 '27 22 25 '$ 18* 13*  GLUCOSE 74 75 85 131* 166*  BUN 72* 100* 42* 74* 96*  CREATININE 10.30* 11.97* 6.37* 8.58* 10.00*  CALCIUM 8.3* 7.5* 8.9 7.5* 7.3*  PHOS  --  8.7*  --  11.0* 10.2*    GFR: Estimated Creatinine Clearance: 6.4 mL/min (A) (by C-G formula based on SCr of 10 mg/dL (H)). Recent Labs  Lab 11/06/21 0441 11/07/21 0137 11/08/21 0225 11/09/21 0109  PROCALCITON 1.28  --   --   --   WBC 3.4* 4.2 4.9 5.0     Liver Function Tests: Recent Labs  Lab 11/05/21 2317 11/07/21 0137 11/07/21 0519 11/08/21 0225 11/09/21 0109  AST 27  --  28  --  44*  ALT 15  --  16  --  25  ALKPHOS 71  --  73  --  58  BILITOT 0.8  --  0.4  --  0.5  PROT 6.6  --  8.2*  --  7.0  ALBUMIN 3.4* 3.1* 4.0 3.4* 2.9*   2.9*    No results for input(s): LIPASE, AMYLASE in the last 168 hours. Recent Labs  Lab 11/06/21 2131 11/07/21 0953  AMMONIA 98* 42*     ABG    Component Value Date/Time   PHART 7.42 11/07/2021 1235    PCO2ART 43 11/07/2021 1235   PO2ART 90 11/07/2021 1235   HCO3 27.2 11/07/2021 1235   TCO2 31 11/05/2021 2322   ACIDBASEDEF 5.2 (H) 01/13/2015 0300   O2SAT 95.7 11/07/2021 1235      Coagulation Profile: No results for input(s): INR, PROTIME in the last 168 hours.  Cardiac Enzymes: No results for input(s): CKTOTAL, CKMB, CKMBINDEX, TROPONINI in the last 168 hours.  HbA1C: Hgb A1c MFr Bld  Date/Time Value Ref Range Status  11/06/2021 12:15 PM 3.9 (L) 4.8 - 5.6 % Final    Comment:    (NOTE) Pre diabetes:          5.7%-6.4%  Diabetes:              >6.4%  Glycemic control for   <7.0% adults with diabetes   12/20/2018 11:23 PM 4.6 (L) 4.8 - 5.6 % Final    Comment:    (NOTE) Pre diabetes:  5.7%-6.4% Diabetes:              >6.4% Glycemic control for   <7.0% adults with diabetes     CBG: Recent Labs  Lab 11/08/21 1149 11/08/21 1605 11/08/21 1934 11/08/21 2305 11/09/21 0357  GLUCAP 116* 120* 104* 130* 148*     Review of Systems:   Encephalopathic  Past Medical History:  She,  has a past medical history of Anemia, Anxiety, ESRD (end stage renal disease) ([place]), Gastroparesis, Hypertension, Narcotic abuse ([place]), Non compliance with medical treatment, Renal disorder (2015), and Vision loss.   Surgical History:   Past Surgical History:  Procedure Laterality Date   A/V FISTULAGRAM Right 02/17/2021   Procedure: A/V FISTULAGRAM;  Surgeon: [name], MD;  Location: [facility];  Service: Cardiovascular;  Laterality: Right;   AV FISTULA PLACEMENT  11/17/2011   Procedure: ARTERIOVENOUS (AV) FISTULA CREATION;  Surgeon: Rosetta Posner, MD;  Location: [facility];  Service: Vascular;  Laterality: Right;   EYE SURGERY     HEMATOMA EVACUATION Right 02/25/2021   Procedure: EVACUATION HEMATOMA RIGHT CHEST;  Surgeon: Waynetta Sandy, MD;  Location: [facility];  Service: Vascular;  Laterality: Right;   INSERTION OF DIALYSIS CATHETER Right 12/08/2020   Procedure:  INSERTION OF TUNNELED DIALYSIS CATHETER;  Surgeon: [name], MD;  Location: [facility];  Service: Vascular;  Laterality: Right;   IR REMOVAL TUN CV CATH W/O FL  05/03/2021   KIDNEY TRANSPLANT     NEPHRECTOMY TRANSPLANTED ORGAN     pancrease transplant     PERIPHERAL VASCULAR BALLOON ANGIOPLASTY Right 02/17/2021   Procedure: PERIPHERAL VASCULAR BALLOON ANGIOPLASTY;  Surgeon: [name], MD;  Location: [facility];  Service: Cardiovascular;  Laterality: Right;   REFRACTIVE SURGERY     REVISON OF ARTERIOVENOUS FISTULA Right 12/08/2020   Procedure: RIGHT ARM ARTERIOVENOUS FISTULA REVISION AND RESECTION;  Surgeon: [name], MD;  Location: [facility] [place];  Service: Vascular;  Laterality: Right;   REVISON OF ARTERIOVENOUS FISTULA Right 02/25/2021   Procedure: RIGHT ARM ARTERIOVENOUS FISTULA REVISION WITH TRANSPOSITION OF CEPHALIC VEIN ON AXILLARY VEIN;  Surgeon: [name], MD;  Location: [place] [facility];  Service: Vascular;  Laterality: Right;     Social History:   reports that she has quit smoking. Her smoking use included pipe. She has never used smokeless tobacco. She reports that she does not drink alcohol and does not use drugs.   Family History:  Her family history includes Hypertension in her father.   Allergies Allergies  Allergen Reactions   Iodinated Contrast Media     Other reaction(s): Unknown     Home Medications  Prior to Admission medications   Medication Sig Start Date End Date Taking? Authorizing Provider  amLODipine (NORVASC) 10 MG tablet Take 10 mg by mouth at bedtime. 09/19/18  Yes [provider]  CATAPRES-TTS-1 0.1 MG/24HR patch Place 0.1 mg onto the skin once a week. 09/26/21  Yes [provider]  ferric citrate (AURYXIA) 1 GM 210 MG(Fe) tablet Take 630 mg by mouth 3 (three) times daily with meals.   Yes [provider]  furosemide (LASIX) 20 MG tablet Take 40 mg by mouth 4 (four) times a week. Take 40 mg on Tues, Thurs,  Sat, and Sun (non-dialysis days) 09/12/19  Yes [provider]  gabapentin (NEURONTIN) 100 MG capsule Take 100 mg by mouth 2 (two) times daily. 10/12/21  Yes [provider]  labetalol (NORMODYNE) 200 MG tablet Take  600 mg by mouth 2 (two) times daily. 09/12/19  Yes [provider]  LOKELMA 10 g PACK packet Take 10 g by mouth See admin instructions. Non dialysis days [place]) 10/13/21  Yes [provider]  losartan (COZAAR) 100 MG tablet Take 100 mg by mouth at bedtime. 02/02/21  Yes [provider]  metoCLOPramide (REGLAN) 5 MG tablet Take 5 mg by mouth every 6 (six) hours as needed for nausea or vomiting. 09/03/21  Yes [provider]  molnupiravir EUA (LAGEVRIO) 200 mg CAPS capsule Take 4 capsules by mouth 2 (two) times daily.   Yes [provider]  mycophenolate (MYFORTIC) 180 MG EC tablet Take 540 mg by mouth 2 (two) times daily.   Yes [provider]  oxyCODONE-acetaminophen (PERCOCET/ROXICET) 5-325 MG tablet Take 1-2 tablets by mouth every 6 (six) hours as needed for severe pain. Patient taking differently: Take 1 tablet by mouth 3 (three) times daily as needed for moderate pain. 03/22/21  Yes [name], MD  predniSONE (DELTASONE) 5 MG tablet Take 15 mg by mouth daily. 06/24/15  Yes [provider]  zolpidem (AMBIEN) 10 MG tablet Take 10 mg by mouth at bedtime as needed for sleep.   Yes [provider]  aspirin EC 81 MG tablet Take 81 mg by mouth daily.    [provider]  colchicine 0.6 MG tablet Take 0.6 mg by mouth 3 (three) times daily as needed (gout flares). 02/28/21   [provider]  cycloSPORINE modified (NEORAL) 100 MG capsule Take 200 mg by mouth 2 (two) times daily.    [provider]  cycloSPORINE modified (NEORAL) 25 MG capsule Take 25 mg by mouth every evening.    [provider]  diphenhydrAMINE (BENADRYL) 25 MG tablet Take 25 mg  by mouth every 6 (six) hours as needed for itching.    [provider]  heparin 1000 unit/mL SOLN injection Heparin Sodium (Porcine) 1,000 Units/mL Catheter Lock Arterial 12/22/20 12/21/21  [provider]  HYDROcodone-acetaminophen (NORCO) 5-325 MG tablet Take 1 tablet by mouth every 6 (six) hours as needed for up to 12 doses for severe pain. Patient not taking: No sig reported 03/02/21   Corena Herter, PA-C  lidocaine-prilocaine (EMLA) cream Apply 1 application topically daily as needed (port access). 07/09/20   [provider]  Methoxy PEG-Epoetin Beta (MIRCERA IJ) Mircera 01/03/21 01/02/22  [provider]  naloxone [facility]) nasal spray 4 mg/0.1 mL Place 1 spray into the nose as needed (opioid overdose).    [provider]  ondansetron (ZOFRAN) 4 MG tablet Take 4 mg by mouth 2 (two) times daily as needed for nausea or vomiting.    [provider]     Critical care time:     Gaylan Gerold, DO

## 2021-11-09 NOTE — Procedures (Addendum)
Patient Name: [name] [name]  ?MRN: [id number]  ?Epilepsy Attending: Lora Havens  ?Referring Physician/Provider: Amie Portland, MD ?Duration: 11/08/2021 2211 to 11/09/2021 1138 ?  ?Patient history: 41 year old who has had a few days worth of feeling unwell and was brought into the hospital for altered mental status. EEG to evaluate for seizure ?  ?Level of alertness:  lethargic  ?  ?AEDs during EEG study: None ?  ?Technical aspects: This EEG study was done with scalp electrodes positioned according to the 10-20 International system of electrode placement. Electrical activity was acquired at a sampling rate of '500Hz'$  and reviewed with a high frequency filter of '70Hz'$  and a low frequency filter of '1Hz'$ . EEG data were recorded continuously and digitally stored.  ?  ?Description: EEG showed continuous generalized 3 to 5 Hz theta-delta slowing. Intermittent generalized periodic discharges with triphasic morphology were also noted at 1-1.5 Hz, more prominent when awake/stimulated. Hyperventilation and photic stimulation were not performed.    ?  ?ABNORMALITY ?- Periodic discharges with triphasic morphology, generalized ( GPDs) ?- Continuous slow, generalized ?  ?IMPRESSION: ?This study is suggestive of moderate diffuse encephalopathy, nonspecific etiology but most likely due to toxic-metabolic causes. No seizures or definite epileptiform discharges were seen throughout the recording. ?  ?Lora Havens  ?

## 2021-11-09 NOTE — Progress Notes (Signed)
LTM was D/C'd patient had no skin break down. [facility] notified.  ?

## 2021-11-09 NOTE — Progress Notes (Signed)
No clinical or electrographic seizures seen. ?Exam per primary team improving. ?Can discontinue LTM ?Continue management of toxic metabolic derangements per primary team as you are ?Neurology will be available as needed ?-- ?Amie Portland, MD ?Neurologist ?[name] ?Pager: [phone] ? ?

## 2021-11-09 NOTE — Progress Notes (Addendum)
Pt receives out-pt HD at [facility] on MWF. Pt has a 6:00 chair time. Clinic is aware that pt tested positive for covid. Will assist as needed.  ? ?Melven Sartorius ?Renal Navigator ?[phone] ?

## 2021-11-09 NOTE — Progress Notes (Signed)
[facility] ?Progress Note ? ?Subjective: seen in room, more alert again today. EEG neg per neuro.  ? ?Vitals:  ? 11/09/21 1000 11/09/21 1100 11/09/21 1154 11/09/21 1200  ?BP: (!) 151/70   (!) 143/47  ?Pulse: 82 82  79  ?Resp: 20 (!) 22  20  ?Temp:   97.7 ?F (36.5 ?C)   ?TempSrc:   Oral   ?SpO2:    95%  ?Weight:      ?Height:      ? ? ?Exam: ? Awake, interacting today, Ox 3 ? no jvd ? Chest cta bilat ? Cor reg no RG ? Abd soft ntnd no ascites ?  Ext no UE/ LE edema ?  Neuro as above, nonfocal, +asterixis mild ?  RUE AVF+bruit ? ? ? OP HD: AF MWF ? 3h 91mn  400/500  59kg  2/2 bath  RUE AVF  15g  Hep 2000 ? - hectorol 6 ug tiw IV ? - no esa ? ?    BUN 42, Creat 10 > 6.3  this am.  NH3 98 >> 42 today ?   3/06 CXR - IMPRESSION: ?1. Interval placement of NG tube extending into the stomach. ?2. Unchanged diffuse bilateral interstitial opacities, edema versus ?multifocal infection. ? ? ? ?Assessment/ Plan: ?AMS - no seizures by EEG. Metabolic due to [facility] uremia/ esrd, infection. MS better w/ lactulose and HD. Still +asterixis which is mild.  ?Fevers - fevers better+ COVID infection, on empiric IV abx and IV acyclovir ?COVID infection/ pna - +CXR findings have not changed despite UF w/ HD.  Doubt edema ?ESRD - usual HD MWF. Will need 3.5- 4 hr HD sessions given continued asterixis. HD planned for tonight.  ?HTN - BP's low to low normal, home BP meds on hold.  ?Volume - CXR not wet and not vol overloaded on exam. 5kg under dry wt. Keep even w/ HD.   ?DM2 ?H/o failed renal transplant - holding cyclosporine and myfortic due to presumed serious infection w/ complications. Getting IV steroids.  ?MBD ckd - resume binders when eating. Cont IV vdra ?Anemia ckd - Hb > 10, no esa needs ? ? ? ? ?Rob SDoctor, hospital?11/09/2021, 2:32 PM ? ? ?Recent Labs  ?Lab 11/08/21 ?0225 11/09/21 ?0109  ?K 4.7 5.0  ?BUN 74* 96*  ?CREATININE 8.58* 10.00*  ?ALBUMIN 3.4* 2.9*  2.9*  ?CALCIUM 7.5* 7.3*  ?PHOS 11.0* 10.2*  ?HGB 13.5 13.5   ? ? ?Inpatient medications: ? vitamin C  500 mg Per Tube Daily  ? aspirin  81 mg Per Tube Daily  ? Chlorhexidine Gluconate Cloth  6 each Topical Daily  ? cholecalciferol  1,000 Units Oral Daily  ? doxercalciferol  6 mcg Intravenous Q M,W,F-HD  ? famotidine  20 mg Oral Daily  ? ferric citrate  630 mg Oral TID WC  ? lactulose  30 g Per Tube BID  ? methylPREDNISolone (SOLU-MEDROL) injection  60 mg Intravenous Q24H  ? molnupiravir EUA  4 capsule Oral BID  ? [START ON 11/10/2021] predniSONE  15 mg Oral Q breakfast  ? zinc sulfate  220 mg Per Tube Daily  ? ? sodium chloride    ? sodium chloride    ? [START ON 11/10/2021] acyclovir (ZOVIRAX) </= 700 mg IVPB    ? cefTRIAXone (ROCEPHIN)  IV    ? dextrose 5 % and 0.9% NaCl 10 mL/hr at 11/09/21 1200  ? ?sodium chloride, sodium chloride, acetaminophen, colchicine, diphenhydrAMINE, heparin, heparin, hydrALAZINE, lidocaine (PF), lidocaine-prilocaine, LORazepam, metoCLOPramide,  naLOXone (NARCAN)  injection, ondansetron **OR** ondansetron (ZOFRAN) IV, pentafluoroprop-tetrafluoroeth ? ? ? ? ? ? ?

## 2021-11-10 ENCOUNTER — Encounter (HOSPITAL_COMMUNITY): Payer: Self-pay | Admitting: Family Medicine

## 2021-11-10 LAB — CBC WITH DIFFERENTIAL/PLATELET
Abs Immature Granulocytes: 0.08 10*3/uL — ABNORMAL HIGH (ref 0.00–0.07)
Basophils Absolute: 0 10*3/uL (ref 0.0–0.1)
Basophils Relative: 1 %
Eosinophils Absolute: 0 10*3/uL (ref 0.0–0.5)
Eosinophils Relative: 0 %
HCT: 39.8 % (ref 36.0–46.0)
Hemoglobin: 12.8 g/dL (ref 12.0–15.0)
Immature Granulocytes: 3 %
Lymphocytes Relative: 10 %
Lymphs Abs: 0.3 10*3/uL — ABNORMAL LOW (ref 0.7–4.0)
MCH: 29.8 pg (ref 26.0–34.0)
MCHC: 32.2 g/dL (ref 30.0–36.0)
MCV: 92.8 fL (ref 80.0–100.0)
Monocytes Absolute: 0.1 10*3/uL (ref 0.1–1.0)
Monocytes Relative: 3 %
Neutro Abs: 2.7 10*3/uL (ref 1.7–7.7)
Neutrophils Relative %: 83 %
Platelets: 50 10*3/uL — ABNORMAL LOW (ref 150–400)
RBC: 4.29 MIL/uL (ref 3.87–5.11)
RDW: 13.3 % (ref 11.5–15.5)
WBC Morphology: INCREASED
WBC: 3.2 10*3/uL — ABNORMAL LOW (ref 4.0–10.5)
nRBC: 0 % (ref 0.0–0.2)

## 2021-11-10 LAB — RENAL FUNCTION PANEL
Albumin: 2.9 g/dL — ABNORMAL LOW (ref 3.5–5.0)
Anion gap: 26 — ABNORMAL HIGH (ref 5–15)
BUN: 137 mg/dL — ABNORMAL HIGH (ref 6–20)
CO2: 10 mmol/L — ABNORMAL LOW (ref 22–32)
Calcium: 7.7 mg/dL — ABNORMAL LOW (ref 8.9–10.3)
Chloride: 105 mmol/L (ref 98–111)
Creatinine, Ser: 12.26 mg/dL — ABNORMAL HIGH (ref 0.44–1.00)
GFR, Estimated: 4 mL/min — ABNORMAL LOW (ref >60)
Glucose, Bld: 117 mg/dL — ABNORMAL HIGH (ref 70–99)
Phosphorus: 8.9 mg/dL — ABNORMAL HIGH (ref 2.5–4.6)
Potassium: 5.1 mmol/L (ref 3.5–5.1)
Sodium: 141 mmol/L (ref 135–145)

## 2021-11-10 LAB — GLUCOSE, CAPILLARY
Glucose-Capillary: 134 mg/dL — ABNORMAL HIGH (ref 70–99)
Glucose-Capillary: 121 mg/dL — ABNORMAL HIGH (ref 70–99)
Glucose-Capillary: 116 mg/dL — ABNORMAL HIGH (ref 70–99)
Glucose-Capillary: 120 mg/dL — ABNORMAL HIGH (ref 70–99)
Glucose-Capillary: 113 mg/dL — ABNORMAL HIGH (ref 70–99)

## 2021-11-10 LAB — FERRITIN: Ferritin: 5091 ng/mL — ABNORMAL HIGH (ref 11–307)

## 2021-11-10 LAB — CYCLOSPORINE: Cyclosporine, LabCorp: 309 ng/mL (ref 100–400)

## 2021-11-10 LAB — C-REACTIVE PROTEIN: CRP: 1.6 mg/dL — ABNORMAL HIGH (ref <1.0)

## 2021-11-10 MED ORDER — ASCORBIC ACID 500 MG PO TABS
500.0000 mg | ORAL_TABLET | Freq: Every day | ORAL | Status: DC
  Administered 2021-11-11 – 2021-11-17 (×6): 500 mg via ORAL
  Filled 2021-11-10 (×8): qty 1

## 2021-11-10 MED ORDER — ZINC SULFATE 220 (50 ZN) MG PO CAPS
220.0000 mg | ORAL_CAPSULE | Freq: Every day | ORAL | Status: DC
  Administered 2021-11-11 – 2021-11-17 (×7): 220 mg via ORAL
  Filled 2021-11-10 (×7): qty 1

## 2021-11-10 MED ORDER — ONDANSETRON HCL 4 MG PO TABS
4.0000 mg | ORAL_TABLET | Freq: Four times a day (QID) | ORAL | Status: DC | PRN
  Administered 2021-11-17: 4 mg via ORAL
  Filled 2021-11-10: qty 1

## 2021-11-10 MED ORDER — CYCLOSPORINE MODIFIED (NEORAL) 25 MG PO CAPS
200.0000 mg | ORAL_CAPSULE | Freq: Every day | ORAL | Status: DC
  Administered 2021-11-10: 12:00:00 200 mg via ORAL
  Filled 2021-11-10: qty 8

## 2021-11-10 MED ORDER — LABETALOL HCL 200 MG PO TABS: 300.0000 mg | ORAL_TABLET | Freq: Three times a day (TID) | ORAL | Status: DC

## 2021-11-10 MED ORDER — TRAMADOL HCL 50 MG PO TABS
50.0000 mg | ORAL_TABLET | Freq: Two times a day (BID) | ORAL | Status: DC | PRN
Start: 2021-11-10 — End: 2021-11-10

## 2021-11-10 MED ORDER — ONDANSETRON HCL 4 MG/2ML IJ SOLN
4.0000 mg | Freq: Four times a day (QID) | INTRAMUSCULAR | Status: DC | PRN
  Administered 2021-11-10 – 2021-11-16 (×7): 4 mg via INTRAVENOUS
  Filled 2021-11-10 (×7): qty 2

## 2021-11-10 MED ORDER — LACTULOSE 10 GM/15ML PO SOLN
30.0000 g | Freq: Two times a day (BID) | ORAL | Status: DC
  Administered 2021-11-10 – 2021-11-16 (×2): 30 g via ORAL
  Filled 2021-11-10 (×9): qty 45

## 2021-11-10 MED ORDER — COLCHICINE 0.6 MG PO TABS
0.6000 mg | ORAL_TABLET | Freq: Three times a day (TID) | ORAL | Status: DC | PRN
Start: 2021-11-10 — End: 2021-11-17
  Administered 2021-11-10: 11:00:00 0.6 mg via ORAL
  Filled 2021-11-10 (×2): qty 1

## 2021-11-10 MED ORDER — CYCLOSPORINE MODIFIED (NEORAL) 25 MG PO CAPS
200.0000 mg | ORAL_CAPSULE | Freq: Every day | ORAL | Status: DC
Start: 2021-11-11 — End: 2021-11-17
  Administered 2021-11-11 – 2021-11-16 (×6): 200 mg via ORAL
  Filled 2021-11-10 (×7): qty 8

## 2021-11-10 MED ORDER — CLONIDINE HCL 0.1 MG PO TABS
0.1000 mg | ORAL_TABLET | Freq: Two times a day (BID) | ORAL | Status: DC
  Administered 2021-11-10 – 2021-11-11 (×3): 0.1 mg via ORAL
  Filled 2021-11-10 (×3): qty 1

## 2021-11-10 MED ORDER — AMLODIPINE BESYLATE 10 MG PO TABS
10.0000 mg | ORAL_TABLET | Freq: Every day | ORAL | Status: DC
  Administered 2021-11-10 – 2021-11-17 (×8): 10 mg via ORAL
  Filled 2021-11-10 (×8): qty 1

## 2021-11-10 MED ORDER — STROKE: EARLY STAGES OF RECOVERY BOOK
Freq: Once | Status: AC
  Filled 2021-11-10: qty 1

## 2021-11-10 MED ORDER — CYCLOSPORINE MODIFIED (NEORAL) 25 MG PO CAPS
100.0000 mg | ORAL_CAPSULE | Freq: Every day | ORAL | Status: DC
  Administered 2021-11-10 – 2021-11-16 (×5): 100 mg via ORAL
  Filled 2021-11-10 (×8): qty 4

## 2021-11-10 MED ORDER — ASPIRIN EC 81 MG PO TBEC
81.0000 mg | DELAYED_RELEASE_TABLET | Freq: Every day | ORAL | Status: DC
  Administered 2021-11-10 – 2021-11-17 (×7): 81 mg via ORAL
  Filled 2021-11-10 (×8): qty 1

## 2021-11-10 MED ORDER — METOCLOPRAMIDE HCL 5 MG PO TABS
5.0000 mg | ORAL_TABLET | Freq: Four times a day (QID) | ORAL | Status: DC | PRN
  Administered 2021-11-10 (×3): 5 mg via ORAL
  Filled 2021-11-10 (×4): qty 1

## 2021-11-10 MED ORDER — ACETAMINOPHEN 325 MG PO TABS
650.0000 mg | ORAL_TABLET | Freq: Four times a day (QID) | ORAL | Status: DC | PRN
  Administered 2021-11-10 – 2021-11-16 (×7): 650 mg via ORAL
  Filled 2021-11-10 (×8): qty 2

## 2021-11-10 MED ORDER — DIPHENHYDRAMINE HCL 12.5 MG/5ML PO ELIX: 25.0000 mg | ORAL_SOLUTION | Freq: Four times a day (QID) | ORAL | Status: DC | PRN

## 2021-11-10 MED ORDER — LABETALOL HCL 300 MG PO TABS
300.0000 mg | ORAL_TABLET | Freq: Three times a day (TID) | ORAL | Status: DC
  Administered 2021-11-10 – 2021-11-17 (×17): 300 mg via ORAL
  Filled 2021-11-10 (×18): qty 1

## 2021-11-10 MED ORDER — OXYCODONE HCL 5 MG PO TABS
2.5000 mg | ORAL_TABLET | Freq: Once | ORAL | Status: AC
  Administered 2021-11-10: 14:00:00 2.5 mg via ORAL
  Filled 2021-11-10: qty 1

## 2021-11-10 NOTE — Plan of Care (Signed)
?  Problem: Education: Goal: Knowledge of General Education information will improve Description: Including pain rating scale, medication(s)/side effects and non-pharmacologic comfort measures Outcome: Progressing   Problem: Health Behavior/Discharge Planning: Goal: Ability to manage health-related needs will improve Outcome: Progressing   Problem: Clinical Measurements: Goal: Ability to maintain clinical measurements within normal limits will improve Outcome: Progressing Goal: Will remain free from infection Outcome: Progressing Goal: Diagnostic test results will improve Outcome: Progressing Goal: Respiratory complications will improve Outcome: Progressing Goal: Cardiovascular complication will be avoided Outcome: Progressing   Problem: Activity: Goal: Risk for activity intolerance will decrease Outcome: Progressing   Problem: Nutrition: Goal: Adequate nutrition will be maintained Outcome: Progressing   Problem: Coping: Goal: Level of anxiety will decrease Outcome: Progressing   Problem: Elimination: Goal: Will not experience complications related to bowel motility Outcome: Progressing Goal: Will not experience complications related to urinary retention Outcome: Progressing   Problem: Pain Managment: Goal: General experience of comfort will improve Outcome: Progressing   Problem: Safety: Goal: Ability to remain free from injury will improve Outcome: Progressing   Problem: Skin Integrity: Goal: Risk for impaired skin integrity will decrease Outcome: Progressing   Problem: Education: Goal: Knowledge of disease or condition will improve Outcome: Progressing Goal: Knowledge of secondary prevention will improve (SELECT ALL) Outcome: Progressing Goal: Knowledge of patient specific risk factors will improve (INDIVIDUALIZE FOR PATIENT) Outcome: Progressing Goal: Individualized Educational Video(s) Outcome: Progressing   Problem: Coping: Goal: Will verbalize  positive feelings about self Outcome: Progressing Goal: Will identify appropriate support needs Outcome: Progressing   Problem: Health Behavior/Discharge Planning: Goal: Ability to manage health-related needs will improve Outcome: Progressing   Problem: Self-Care: Goal: Ability to participate in self-care as condition permits will improve Outcome: Progressing Goal: Verbalization of feelings and concerns over difficulty with self-care will improve Outcome: Progressing Goal: Ability to communicate needs accurately will improve Outcome: Progressing   Problem: Nutrition: Goal: Risk of aspiration will decrease Outcome: Progressing   Problem: Ischemic Stroke/TIA Tissue Perfusion: Goal: Complications of ischemic stroke/TIA will be minimized Outcome: Progressing   

## 2021-11-10 NOTE — Progress Notes (Addendum)
Neurology Progress Note   S:// Remains awake and alert.  O:// Current vital signs: BP (!) 174/71    Pulse 88    Temp 98.1 F (36.7 C)    Resp 20    Ht '5\' 4"'$  (1.626 m)    Wt 53.4 kg    LMP  (LMP Unknown) Comment: pt ams   SpO2 94%    BMI 20.21 kg/m  Vital signs in last 24 hours: Temp:  [97.8 F (36.6 C)-98.1 F (36.7 C)] 98.1 F (36.7 C) (03/09 0909) Pulse Rate:  [79-224] 88 (03/09 1300) Resp:  [14-24] 20 (03/09 1300) BP: (148-206)/(50-94) 174/71 (03/09 1200) SpO2:  [92 %-100 %] 94 % (03/09 1300) GENERAL: Awake, alert in NAD HEENT: - Normocephalic and atraumatic, dry mm, no LN++, no Thyromegally LUNGS - Clear to auscultation bilaterally with no wheezes CV - S1S2 RRR, no m/r/g, equal pulses bilaterally. ABDOMEN - Soft, nontender, nondistended with normoactive BS Ext: warm, well perfused, intact peripheral pulses, no edema NEURO:  Fully awake.  Knows she is in the hospital. Could not repeat a month. Able to have a coherent conversation No aphasia No dysarthria Cranial nerves II to XII intact Motor examination with antigravity strength in the upper extremities and somewhat weaker but symmetric antigravity in lower extremities Sensation intact to touch 1a Level of Conscious.: 0 1b LOC Questions: 2 1c LOC Commands: 0 2 Best Gaze: 0 3 Visual: 0 4 Facial Palsy: 0 5a Motor Arm - left: 0 5b Motor Arm - Right: 0 6a Motor Leg - Left: 0 6b Motor Leg - Right: 0 7 Limb Ataxia: 0 8 Sensory: 0 9 Best Language: 0 10 Dysarthria: 0 11 Extinct. and Inatten.: 0 TOTAL: 2   Medications  Current Facility-Administered Medications:    0.9 %  sodium chloride infusion, 100 mL, Intravenous, PRN, Joelyn Oms, Ryan B, MD   0.9 %  sodium chloride infusion, 100 mL, Intravenous, PRN, Pearson Grippe B, MD   acetaminophen (TYLENOL) tablet 650 mg, 650 mg, Oral, Q6H PRN, Hosie Poisson, MD   amLODipine (NORVASC) tablet 10 mg, 10 mg, Oral, Daily, Karleen Hampshire, Vijaya, MD, 10 mg at 11/10/21 1047   [START ON  11/11/2021] ascorbic acid (VITAMIN C) tablet 500 mg, 500 mg, Oral, Daily, Hosie Poisson, MD   aspirin EC tablet 81 mg, 81 mg, Oral, Daily, Hosie Poisson, MD, 81 mg at 11/10/21 1046   cefTRIAXone (ROCEPHIN) 2 g in sodium chloride 0.9 % 100 mL IVPB, 2 g, Intravenous, Q24H, Akula, Vijaya, MD, Last Rate: 200 mL/hr at 11/09/21 1500, Infusion Verify at 11/09/21 1500   Chlorhexidine Gluconate Cloth 2 % PADS 6 each, 6 each, Topical, Daily, Mansy, Jan A, MD, 6 each at 11/10/21 1400   cholecalciferol (VITAMIN D3) tablet 1,000 Units, 1,000 Units, Oral, Daily, Mansy, Jan A, MD, 1,000 Units at 11/10/21 1047   cloNIDine (CATAPRES) tablet 0.1 mg, 0.1 mg, Oral, BID, Karleen Hampshire, Vijaya, MD, 0.1 mg at 11/10/21 1247   colchicine tablet 0.6 mg, 0.6 mg, Oral, TID PRN, Hosie Poisson, MD, 0.6 mg at 11/10/21 1047   cycloSPORINE modified (NEORAL) capsule 200 mg, 200 mg, Oral, Daily, Roney Jaffe, MD, 200 mg at 11/10/21 1159   dextrose 5 %-0.9 % sodium chloride infusion, , Intravenous, Continuous, Eugenie Filler, MD, Stopped at 11/09/21 1638   diphenhydrAMINE (BENADRYL) 12.5 MG/5ML elixir 25 mg, 25 mg, Oral, Q6H PRN, Hosie Poisson, MD   doxercalciferol (HECTOROL) injection 6 mcg, 6 mcg, Intravenous, Q M,W,F-HD, Roney Jaffe, MD   famotidine (PEPCID) tablet 20 mg,  20 mg, Oral, Daily, Gaylan Gerold, DO, 20 mg at 11/10/21 1046   ferric citrate (AURYXIA) tablet 630 mg, 630 mg, Oral, TID WC, Clark, Laura P, DO, 630 mg at 11/10/21 1048   heparin injection 1,000 Units, 1,000 Units, Dialysis, PRN, Pearson Grippe B, MD   heparin injection 1,200 Units, 20 Units/kg, Dialysis, PRN, Pearson Grippe B, MD   hydrALAZINE (APRESOLINE) injection 10 mg, 10 mg, Intravenous, Q6H PRN, Eugenie Filler, MD, 10 mg at 11/10/21 1148   labetalol (NORMODYNE) tablet 300 mg, 300 mg, Oral, TID, Hosie Poisson, MD, 300 mg at 11/10/21 1247   lactulose (CHRONULAC) 10 GM/15ML solution 30 g, 30 g, Oral, BID, Akula, Vijaya, MD   lidocaine (PF) (XYLOCAINE) 1 %  injection 5 mL, 5 mL, Intradermal, PRN, Pearson Grippe B, MD   lidocaine-prilocaine (EMLA) cream 1 application, 1 application., Topical, PRN, Rexene Agent, MD   metoCLOPramide (REGLAN) tablet 5 mg, 5 mg, Oral, Q6H PRN, Hosie Poisson, MD, 5 mg at 11/10/21 1047   molnupiravir EUA (LAGEVRIO) capsule 800 mg, 4 capsule, Oral, BID, Mansy, Jan A, MD, 800 mg at 11/10/21 1157   naloxone (NARCAN) injection 0.4 mg, 0.4 mg, Intravenous, PRN, Kommor, Madison, MD, 0.4 mg at 11/06/21 1011   ondansetron (ZOFRAN) tablet 4 mg, 4 mg, Oral, Q6H PRN **OR** ondansetron (ZOFRAN) injection 4 mg, 4 mg, Intravenous, Q6H PRN, Hosie Poisson, MD   pentafluoroprop-tetrafluoroeth (GEBAUERS) aerosol 1 application, 1 application., Topical, PRN, Rexene Agent, MD   predniSONE (DELTASONE) tablet 15 mg, 15 mg, Oral, Q breakfast, Gaylan Gerold, DO, 15 mg at 11/10/21 0828   [START ON 11/11/2021] zinc sulfate capsule 220 mg, 220 mg, Oral, Daily, Hosie Poisson, MD Labs CBC    Component Value Date/Time   WBC 3.2 (L) 11/10/2021 0509   RBC 4.29 11/10/2021 0509   HGB 12.8 11/10/2021 0509   HCT 39.8 11/10/2021 0509   PLT 50 (L) 11/10/2021 0509   MCV 92.8 11/10/2021 0509   MCH 29.8 11/10/2021 0509   MCHC 32.2 11/10/2021 0509   RDW 13.3 11/10/2021 0509   LYMPHSABS 0.3 (L) 11/10/2021 0509   MONOABS 0.1 11/10/2021 0509   EOSABS 0.0 11/10/2021 0509   BASOSABS 0.0 11/10/2021 0509    CMP     Component Value Date/Time   NA 141 11/10/2021 0509   K 5.1 11/10/2021 0509   CL 105 11/10/2021 0509   CO2 10 (L) 11/10/2021 0509   GLUCOSE 117 (H) 11/10/2021 0509   BUN 137 (H) 11/10/2021 0509   CREATININE 12.26 (H) 11/10/2021 0509   CALCIUM 7.7 (L) 11/10/2021 0509   PROT 7.0 11/09/2021 0109   ALBUMIN 2.9 (L) 11/10/2021 0509   AST 44 (H) 11/09/2021 0109   ALT 25 11/09/2021 0109   ALKPHOS 58 11/09/2021 0109   BILITOT 0.5 11/09/2021 0109   GFRNONAA 4 (L) 11/10/2021 0509   GFRAA 3 (L) 05/14/2020 0223   CSF Glu 81 Prt 31 RBC 91 &  2 WBC  1 & 1   Imaging I have reviewed images in epic and the results pertinent to this consultation are: MRI brain IMPRESSION: Incomplete and motion limited study, as detailed above.   1. Along the left frontal convexity there is a 1.0 x 2.5 cm area of T2/FLAIR hyperintensity and susceptibility artifact, which appears to be extra-axial. This finding is most suspicious for recent hemorrhage, probably subdural in location. No significant mass effect. A repeat CT head could assess for hyperdense blood products in this region. If the CT head  does not confirm hemorrhage, then follow-up postcontrast MRI imaging is recommended to exclude a mass (such as a meningioma). 2. Small acute infarcts in the posterior paramidline cerebellum bilaterally. 3. Additional punctate focus of restricted diffusion in the splenium of the corpus callosum, which likely represents a cytotoxic lesion of the corpus callosum (Hallam). This has a broad differential that includes the sequela of seizures, metabolic disturbance, infection, or sequela of drugs/toxins. 4. Small remote infarcts in the right pons, inferior cerebellum, and right basal ganglia.  Repeat CTH: confirms the left SDH.  Assessment:  41 year old who presented to the hospital after feeling unwell for few days-has a history of ESRD, pancreatic and renal transplant that is fairly likely secondary to noncompliance, extremely encephalopathic.  Her renal function was somewhat improving, initially came in with hyperammonemia which was improving-thought to have taken more than prescribed amount of opiates and Narcan was given with some improved response but then again became encephalopathic. Also COVID-positive with pulmonary edema Neurology consultation for altered mental status Initial thought process is that this is all related to her toxic metabolic encephalopathy from COVID-19 infection as well as ESRD but since she showed no improvement, she  underwent a spinal tap results of which are bland and not convincing for infection. EEG overnight was done for concern for seizures-some concern that the generalized periodic discharges could be something real rather than metabolic-Ativan challenge given which improved those findings.  Other etiology could be cefepime toxicity. No antiepileptics used and mentation is improving-likely EEG findings are secondary to toxic metabolic encephalopathy. 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 -Frequent neurochecks, PT OT speech therapy -Continue medical management of toxic metabolic derangements per primary team as you are Discussed with Dr. Karleen Hampshire -- Amie Portland, MD Neurologist [name] Pager: [phone]  CRITICAL CARE ATTESTATION Performed by: Amie Portland, MD Total critical care time: 45 minutes Critical care time was exclusive of separately billable procedures and treating other patients and/or supervising APPs/Residents/Students Critical care was necessary to treat or prevent imminent or life-threatening deterioration due to toxic metabolic encephalopathy  This patient is critically ill and at significant risk  for neurological worsening and/or death and care requires constant monitoring. Critical care was time spent personally by me on the following activities: development of treatment plan with patient and/or surrogate as well as nursing, discussions with consultants, evaluation of patient's response to treatment, examination of patient, obtaining history from patient or surrogate, ordering and performing treatments and interventions, ordering and review of laboratory studies, ordering and review of radiographic studies, pulse oximetry, re-evaluation of patient's condition, participation in multidisciplinary rounds and medical decision making of high complexity in the care of this patient.

## 2021-11-10 NOTE — Progress Notes (Signed)
[facility] ?Progress Note ? ?Subjective: seen in room, more alert again today. No further asterixis ? ?Vitals:  ? 11/10/21 1300 11/10/21 1400 11/10/21 1500 11/10/21 1534  ?BP:      ?Pulse: 88 84 86   ?Resp: '20 16 17   '$ ?Temp:    98.2 ?F (36.8 ?C)  ?TempSrc:    Oral  ?SpO2: 94% 96% 98%   ?Weight:      ?Height:      ? ? ?Exam: ? Awake, interacting today, Ox 3 ? no jvd ? Chest cta bilat ? Cor reg no RG ? Abd soft ntnd no ascites ?  Ext no UE/ LE edema ?  Neuro as above, nonfocal, no asterixis ?  RUE AVF+bruit ? ? ? OP HD: AF MWF ? 3h 42mn  400/500  59kg  2/2 bath  RUE AVF  15g  Hep 2000 ? - hectorol 6 ug tiw IV ? - no esa ? ?    BUN 42, Creat 10 > 6.3  this am.  NH3 98 >> 42 today ?   3/06 CXR - IMPRESSION: ?1. Interval placement of NG tube extending into the stomach. ?2. Unchanged diffuse bilateral interstitial opacities, edema versus ?multifocal infection. ? ? ? ?Assessment/ Plan: ?AMS - no seizures by EEG. Metabolic due to [facility] uremia/ esrd, possible acute SDH. NSurg evaluating. MS better w/ lactulose and HD. Asterixis resolved.  ?Fevers - resolved, gettign IV Rocephin and molnupiravir ?COVID infection/ pna - per pmd ?ESRD - usual HD MWF. HD tomorrow 3.5h.  ?HTN - BP's were low, now high. BP meds resumed. 6kg under dry wt , no vol ^ on exam. ?DM2 ?H/o failed pancreas/ renal transplant - pancreas still viable on CyA, pred and myfortic. CyA and myfortic held initially, have d/w pharmacy. CyA high at 309 (trough 100- 150 goal), will resume now at '200mg'$  am and '100mg'$  pm which is about 25% lower dose. Will resume myfortic when done w/ IV abx. Getter high dose po prednisone at 15 qd. Pt not sure who is following her pancreas transplant. We will f/u on CyA levels post dc at the OP HD unit.  ?MBD ckd - resume binders when eating. Cont IV vdra ?Anemia ckd - Hb > 10, no esa needs ? ? ? ? ?Rob SDoctor, hospital?11/10/2021, 4:30 PM ? ? ?Recent Labs  ?Lab 11/09/21 ?0109 11/10/21 ?06606 ?K 5.0 5.1  ?BUN 96* 137*  ?CREATININE  10.00* 12.26*  ?ALBUMIN 2.9*  2.9* 2.9*  ?CALCIUM 7.3* 7.7*  ?PHOS 10.2* 8.9*  ?HGB 13.5 12.8  ? ? ?Inpatient medications: ?  stroke: mapping our early stages of recovery book   Does not apply Once  ? amLODipine  10 mg Oral Daily  ? [START ON 11/11/2021] vitamin C  500 mg Oral Daily  ? aspirin EC  81 mg Oral Daily  ? Chlorhexidine Gluconate Cloth  6 each Topical Daily  ? cholecalciferol  1,000 Units Oral Daily  ? cloNIDine  0.1 mg Oral BID  ? cycloSPORINE modified  100 mg Oral Q1400  ? [START ON 11/11/2021] cycloSPORINE modified  200 mg Oral Q breakfast  ? doxercalciferol  6 mcg Intravenous Q M,W,F-HD  ? famotidine  20 mg Oral Daily  ? ferric citrate  630 mg Oral TID WC  ? labetalol  300 mg Oral TID  ? lactulose  30 g Oral BID  ? molnupiravir EUA  4 capsule Oral BID  ? predniSONE  15 mg Oral Q breakfast  ? [START  ON 11/11/2021] zinc sulfate  220 mg Oral Daily  ? ? sodium chloride    ? sodium chloride    ? cefTRIAXone (ROCEPHIN)  IV 2 g (11/10/21 1532)  ? dextrose 5 % and 0.9% NaCl Stopped (11/09/21 1638)  ? ?sodium chloride, sodium chloride, acetaminophen, colchicine, diphenhydrAMINE, heparin, heparin, hydrALAZINE, lidocaine (PF), lidocaine-prilocaine, metoCLOPramide, naLOXone (NARCAN)  injection, ondansetron **OR** ondansetron (ZOFRAN) IV, pentafluoroprop-tetrafluoroeth ? ? ? ? ? ? ?

## 2021-11-10 NOTE — Evaluation (Signed)
Physical Therapy Evaluation ?Patient Details ?Name: [name] [name] ?MRN: [id number] ?DOB: 1981-09-03 ?Today's Date: 11/10/2021 ? ?History of Present Illness ? 41 y.o. female presented via EMS 11/05/20 with acute onset of altered mental status with confusion after friends had not heard from her in days. Arrived with pinpoint pupils and altered mental status and BP 187/79, COVID positive, possible narcotics overdose, given Narcan, found to be hyperammonemia EEG showing diffuse severe encephalopathy. MRI left frontal convexity area that appears to be consistent with a possibility of blood products versus a meningioma and cytotoxic lesion of the corpus callosum  PMH: end-stage renal disease on hemodialysis on TTS, s/p renal and pancreatic transplant, complicated by medication nonadherence and rejection, hypertension, gastroparesis, anxiety, anemia and narcotic abuse,  ?Clinical Impression ? PTA pt reports living in two story home with steps to enter and bed and bath upstairs. Pt reports driving herself to dialysis, and walking her dog. Reports she has a job but can't remember what she does. Pt also reports that she is independent in self care and medication management. As session progressed pt with increasing difficulty with command follow, likely due to reports 10/10 headache pain and nausea although BP noted to be 204/88. Pt moves around bed with mod I including coming to longsitting with headache pain. Deferred out of bed due to safety concerns. PT recommending supervision and HHPT at discharge but likely will progress to independence. PT will continue to follow acutely.  ?   ? ?Recommendations for follow up therapy are one component of a multi-disciplinary discharge planning process, led by the attending physician.  Recommendations may be updated based on patient status, additional functional criteria and insurance authorization. ? ?Follow Up Recommendations Home health PT ? ?  ?Assistance Recommended at Discharge  Intermittent Supervision/Assistance  ?Patient can return home with the following ? A lot of help with walking and/or transfers;A lot of help with bathing/dressing/bathroom;Assistance with cooking/housework;Assistance with feeding;Direct supervision/assist for medications management;Direct supervision/assist for financial management;Assist for transportation ? ?  ?Equipment Recommendations Rolling walker (2 wheels)  ?   ?Functional Status Assessment Patient has had a recent decline in their functional status and demonstrates the ability to make significant improvements in function in a reasonable and predictable amount of time.  ? ?  ?Precautions / Restrictions Precautions ?Precautions: Fall ?Restrictions ?Weight Bearing Restrictions: No  ? ?  ? ?Mobility ? Bed Mobility ?Overal bed mobility: Modified Independent ?  ?  ?  ?  ?  ?  ?General bed mobility comments: able to roll L and R and come to long sitting with no outside assist ?  ? ?Transfers ?  ?  ?  ?  ?  ?  ?  ?  ?  ?General transfer comment: deferred due to high BP and c/o of headache in presence of MRI results of possible brain hemmorrhage and acute infarcts ?  ? ?  ? ?  ? ? ? ? ?Pertinent Vitals/Pain Pain Assessment ?Pain Assessment: 0-10 ?Pain Score: 10-Worst pain ever ?Pain Location: head ?Pain Descriptors / Indicators: Headache ?Pain Intervention(s): Premedicated before session, Monitored during session, Limited activity within patient's tolerance, Repositioned  ? ? ?Home Living Family/patient expects to be discharged to:: Private residence ?Living Arrangements: Alone ?  ?Type of Home: House ?Home Access: Stairs to enter ?Entrance Stairs-Rails: Can reach both ?Entrance Stairs-Number of Steps: 2 ?  ?Home Layout: Two level;Bed/bath upstairs ?Home Equipment: None ?   ?  ?Prior Function Prior Level of Function : Driving;Patient poor historian/Family not available ?  ?  ?  ?  ?  ?  ?  Mobility Comments: reports able to walk dog and drive herself to dialysis ?ADLs  Comments: reports independence with medication management, and self care ?  ? ? ?Hand Dominance  ? Dominant Hand: Right ? ?  ?Extremity/Trunk Assessment  ? Upper Extremity Assessment ?Upper Extremity Assessment: Defer to OT evaluation ?  ? ?Lower Extremity Assessment ?Lower Extremity Assessment: RLE deficits/detail;LLE deficits/detail ?RLE Deficits / Details: ROM WFL, strength grossly 3+/5 ?RLE Sensation: decreased light touch ?RLE Coordination: decreased fine motor ?LLE Deficits / Details: ROM WFL, strength grossly 3+/5 ?LLE Sensation: decreased light touch ?LLE Coordination: decreased fine motor ?  ? ?   ?Communication  ? Communication: No difficulties  ?Cognition Arousal/Alertness: Lethargic ?Behavior During Therapy: Flat affect ?Overall Cognitive Status: Impaired/Different from baseline ?Area of Impairment: Orientation, Memory, Following commands, Attention, Awareness, Problem solving ?  ?  ?  ?  ?  ?  ?  ?  ?Orientation Level: Disoriented to, Time ?Current Attention Level: Selective ?Memory: Decreased short-term memory ?Following Commands: Follows one step commands with increased time, Follows multi-step commands with increased time ?  ?  ?Problem Solving: Slow processing, Decreased initiation, Difficulty sequencing, Requires verbal cues, Requires tactile cues ?  ?  ?  ? ?  ?General Comments General comments (skin integrity, edema, etc.): BP 202/90, 204/88, 196/93, c/o of dizziness, RN notified of elevated BP HR 83, 100% SpO2 on RA, ? ?  ?   ? ?Assessment/Plan  ?  ?PT Assessment Patient needs continued PT services  ?PT Problem List Pain;Decreased activity tolerance;Decreased strength;Decreased mobility;Decreased cognition;Decreased safety awareness;Impaired sensation ? ?   ?  ?PT Treatment Interventions Gait training;Stair training;Functional mobility training;Therapeutic activities;Therapeutic exercise;Balance training;Cognitive remediation;Patient/family education   ? ?PT Goals (Current goals can be found in  the Care Plan section)  ?Acute Rehab PT Goals ?Patient Stated Goal: headache to go away ?PT Goal Formulation: Patient unable to participate in goal setting ?Time For Goal Achievement: 11/24/21 ?Potential to Achieve Goals: Fair ? ?  ?Frequency Min 3X/week ?  ? ? ?Co-evaluation PT/OT/SLP Co-Evaluation/Treatment: Yes ?Reason for Co-Treatment: Necessary to address cognition/behavior during functional activity;For patient/therapist safety ?PT goals addressed during session: Mobility/safety with mobility ?  ?  ? ? ?  ?AM-PAC PT "6 Clicks" Mobility  ?Outcome Measure Help needed turning from your back to your side while in a flat bed without using bedrails?: None ?Help needed moving from lying on your back to sitting on the side of a flat bed without using bedrails?: None ?Help needed moving to and from a bed to a chair (including a wheelchair)?: A Little ?Help needed standing up from a chair using your arms (e.g., wheelchair or bedside chair)?: A Little ?Help needed to walk in hospital room?: A Lot ?Help needed climbing 3-5 steps with a railing? : Total ?6 Click Score: 17 ? ?  ?End of Session   ?Activity Tolerance: Patient limited by lethargy;Patient limited by pain ?Patient left: in bed;with call bell/phone within reach;with bed alarm set ?Nurse Communication: Mobility status;Other (comment) (high BP) ?PT Visit Diagnosis: Muscle weakness (generalized) (M62.81);Dizziness and giddiness (R42);Other symptoms and signs involving the nervous system (R29.898);Pain ?Pain - part of body:  (headache) ?  ? ?Time: 4098-1191 ?PT Time Calculation (min) (ACUTE ONLY): 30 min ? ? ?Charges:   PT Evaluation ?$PT Eval Moderate Complexity: 1 Mod ?  ?  ?   ? ? ?Katheen Aslin B. Migdalia Dk PT, DPT ?Acute Rehabilitation Services ?Pager [phone] ?Office [phone] ? ? ?[place] ?11/10/2021, 12:00 PM ? ?

## 2021-11-10 NOTE — Consult Note (Signed)
Neurosurgery Consultation ? ?Reason for Consult: Subdural hematoma ?Referring Physician: Rory Percy ? ?CC: Subdural hematoma ? ?HPI: This is a 41 y.o. woman with complex PMHx that presents with progressive altered mental status. PMHx includes a prior kidney-panc xplant, ESRD on HD, ischemic infarcts, +Covid, likely toxic encephalopathy. When discussing with her today, she is complaining of pain everywhere, including a headache but her headache is no worse than her pain anywhere else. No changes in vision, she just really feels diffusely miserable with +ROS to almost everything she's asked, said "I'm just a mess". No new focal weakness or numbness. No recent use of anti-platelet or anti-coagulant medications except some heparin with IHD and line flushes. She does not recall any trauma but does have thrombocytopenia with PLT of 50. ? ?ROS: A 14 point ROS was performed and is negative except as noted in the HPI.  ? ?PMHx:  ?Past Medical History:  ?Diagnosis Date  ? Anemia   ? Anxiety   ? ESRD (end stage renal disease) ([place])   ? Henmodialysis  ? Gastroparesis   ? Hypertension   ? Narcotic abuse ([place])   ? Non compliance with medical treatment   ? Renal disorder 2015  ? transplant  ? Vision loss   ? ?FamHx:  ?Family History  ?Problem Relation Age of Onset  ? Hypertension Father   ? ?SocHx:  reports that she has quit smoking. Her smoking use included pipe. She has never used smokeless tobacco. She reports that she does not drink alcohol and does not use drugs. ? ?Exam: ?Vital signs in last 24 hours: ?Temp:  [97.7 ?F (36.5 ?C)-98.1 ?F (36.7 ?C)] 98.1 ?F (36.7 ?C) (03/09 0932) ?Pulse Rate:  [79-224] 88 (03/09 1045) ?Resp:  [14-24] 15 (03/09 1045) ?BP: (136-206)/(47-103) 186/82 (03/09 1045) ?SpO2:  [92 %-100 %] 100 % (03/09 1045) ?General: Awake, alert, cooperative, lying in bed, appears acutely ill ?Head: Normocephalic and atruamatic, some scattered skin lesions but no obvious signs of trauma, no focal areas of tenderness ?HEENT:  Neck supple ?Pulmonary: breathing room air comfortably, no evidence of increased work of breathing ?Cardiac: RRR ?Abdomen: mildly tender, rectal tube in place w/ loose stool ?Extremities: Warm and well perfused x4, scattered skin lesions ?Neuro: Aox3 (states she's tired of telling people what year it is), PERRL, EOMI, FS ?Strength 5/5 x4, SILTx4, no drift ? ? ?Assessment and Plan: 41 y.o. woman w/ complex PMHx including prior transplant, ESRD, thrombocytopenia. MRI and [facility] personally reviewed, which shows left frontal subdural hematoma, appears acute based off MRI (22h ago) but partial study with some artifact. Also appears acute on CTH, difficult to compare size between different modalities due to technical issues with the MRI. ? ?-no acute neurosurgical intervention indicated at this time ?-recommend PLT>50k ?-given difficulty with definitively stating that the hemorrhage is stable in size, will repeat the CT head now (12h after prior) to confirm it's stable in size ?-avoid anticoagulants as able, okay to have small dose heparin with IHD, avoid anti-platelets ?-please call with any concerns or questions ? ?Judith Part, MD ?11/10/21 ?11:44 AM ?[facility] ? ?

## 2021-11-10 NOTE — Progress Notes (Signed)
[name]                                                                               [name] [name], is a 41 y.o. female, DOB - [birth year], HTD:[id number] Admit date - 11/05/2021    Outpatient Primary MD for the patient is Patient, No Pcp Per (Inactive)  LOS - 4  days    Brief summary   Patient is a 41 year old unfortunate female history of end-stage renal disease on HD on TTS, status post renal and pancreatic transplant, complicated by medical nonadherence and rejection, hypertension, gastroparesis, anxiety, anemia and narcotic abuse presented to the ED with acute onset of altered mental status and confusion.  It was noted that 1 of patient's friends had not heard from her for the last couple of days and EMS called.  When EMS got there patient noted to be conscious but disoriented.  Unknown as to whether patient had a last hemodialysis session.  Patient seen in the ED noted to have pinpoint pupils and altered mental status noted to be somnolent and arousable.  Patient given a dose of IV Narcan which significantly improved her mental status briefly.  Patient seen in the ED COVID-19 PCR obtained was positive.  Influenza PCR negative.  EKG showed normal sinus rhythm and some peaked T waves anterior laterally.  Initial high-sensitivity troponin was 76.  Chest x-ray showed mild pulmonary edema likely secondary to volume overload.  Noncontrast head CT negative. She was transferred to ICU for acute encephalopathy and worsening respiratory status.    Assessment & Plan    Assessment and Plan: * Acute toxic and metabolic  encephalopathy - This is likely multifactorial.  It could be related to COVID encephalopathy as well as uremia with end-stage renal disease on hemodialysis with question about compliance and certainly polypharmacy with opiate overdose, given response to IV Narcan., vs from stroke and sub dural hematoma. -CT head negative for any acute abnormalities on admission.  MRI  brain without contrast  on 3/8 showed Small acute infarcts in the posterior paramidline cerebellum bilaterally. She was also found to have anterior left frontal subdural hemorrhage, confirmed by a CT HEAD without contrast.  - Neurology on board and Neurosurgery consulted on 3/9. - she wound also need a contrast enhanced study once she is more stable.  LP done and does not appear to be consistent with meningitis.  - HSV negative - d/c acyclovir.  - further stroke work up in progress with echocardiogram , carotid duplex and MRI brain and head and neck with contrast, hemoglobin A1c and lipid panel.    Pulmonary edema - Pulmonary edema noted on chest x-ray likely secondary to fluid overload secondary to end-stage renal disease with probable noncompliance with hemodialysis.   -Nephrology following.    Hypertensive urgency-  BP parameters not well controlled.  Added norvasc 10 mg daily, labetalol 300 mg TID, ( was taking 600 mg BID) and clonidine 0.1 mg BID.  On prn hydralazine.    Type 2 diabetes mellitus with peripheral neuropathy (HCC) -Repeat hemoglobin A1c 3.9. CBG (last 3)  Recent Labs    11/09/21 2333 11/10/21 0408 11/10/21 0746  GLUCAP 117* 134* 121*  No changes in meds.    Gout - Colchicine as needed.  History of renal transplant -She is status post renal and pancreatic transplant with antirejection medication noncompliance. - She is currently being evaluated for renal transplant. - she was restarted on cyclosporine 200 mg during the day and 100 mg at night, in addition to prednisone 15 mg daily.   Fever on admission.  From CAP vs COVID infection.  Afebrile in the last 24 hours.   COVID-19 virus infection - Patient noted to be positive for COVID-19 infection -Follow inflammatory markers. -Patient chronically on prednisone. -Continue molnupiravir. -Supportive care.   Hyperkalemia Resolved with HD.      Estimated body mass index is 20.21 kg/m as calculated  from the following:   Height as of this encounter: '5\' 4"'$  (1.626 m).   Weight as of this encounter: 53.4 kg.  Code Status: full code.  DVT Prophylaxis:  Place and maintain sequential compression device Start: 11/06/21 0347   Level of Care: Level of care: Telemetry Medical Family Communication: None at bedside.   Disposition Plan:     Remains inpatient appropriate:  further work up for stroke.   Procedures:  MRI brain.   Consultants:   Neurology Nephrology.  Neuro surgery.   Antimicrobials:   Anti-infectives (From admission, onward)    Start     Dose/Rate Route Frequency Ordered Stop   11/10/21 1800  acyclovir (ZOVIRAX) 260 mg in dextrose 5 % 100 mL IVPB        260 mg 105.2 mL/hr over 60 Minutes Intravenous Every 24 hours 11/09/21 1231     11/09/21 1500  cefTRIAXone (ROCEPHIN) 2 g in sodium chloride 0.9 % 100 mL IVPB        2 g 200 mL/hr over 30 Minutes Intravenous Every 24 hours 11/09/21 0840     11/09/21 1400  acyclovir (ZOVIRAX) 260 mg in dextrose 5 % 100 mL IVPB        260 mg 105.2 mL/hr over 60 Minutes Intravenous Once 11/09/21 1231 11/09/21 1455   11/09/21 1200  vancomycin (VANCOREADY) IVPB 500 mg/100 mL  Status:  Discontinued        500 mg 100 mL/hr over 60 Minutes Intravenous Every M-W-F (Hemodialysis) 11/07/21 0917 11/09/21 0840   11/09/21 0000  piperacillin-tazobactam (ZOSYN) IVPB 2.25 g  Status:  Discontinued       See Hyperspace for full Linked Orders Report.   2.25 g 100 mL/hr over 30 Minutes Intravenous Every 8 hours 11/08/21 1520 11/09/21 0840   11/08/21 1800  acyclovir (ZOVIRAX) 260 mg in dextrose 5 % 100 mL IVPB  Status:  Discontinued        260 mg 105.2 mL/hr over 60 Minutes Intravenous Every 24 hours 11/08/21 0735 11/09/21 1231   11/08/21 1800  ampicillin (OMNIPEN) 2 g in sodium chloride 0.9 % 100 mL IVPB  Status:  Discontinued        2 g 300 mL/hr over 20 Minutes Intravenous Every 24 hours 11/08/21 0735 11/08/21 1505   11/08/21 1615   piperacillin-tazobactam (ZOSYN) IVPB 3.375 g       See Hyperspace for full Linked Orders Report.   3.375 g 100 mL/hr over 30 Minutes Intravenous  Once 11/08/21 1520 11/08/21 1624   11/07/21 1200  vancomycin (VANCOREADY) IVPB 500 mg/100 mL  Status:  Discontinued        500 mg 100 mL/hr over 60 Minutes Intravenous Every M-W-F (Hemodialysis) 11/07/21 0812 11/07/21 0917   11/07/21 1015  ampicillin (OMNIPEN) 2  g in sodium chloride 0.9 % 100 mL IVPB  Status:  Discontinued        2 g 300 mL/hr over 20 Minutes Intravenous Every 24 hours 11/07/21 0927 11/08/21 0735   11/07/21 1015  acyclovir (ZOVIRAX) 260 mg in dextrose 5 % 100 mL IVPB  Status:  Discontinued        260 mg 105.2 mL/hr over 60 Minutes Intravenous Every 24 hours 11/07/21 0927 11/08/21 0735   11/07/21 0900  vancomycin (VANCOCIN) IVPB 1000 mg/200 mL premix        1,000 mg 200 mL/hr over 60 Minutes Intravenous  Once 11/07/21 0812 11/07/21 0956   11/07/21 0900  ceFEPIme (MAXIPIME) 1 g in sodium chloride 0.9 % 100 mL IVPB  Status:  Discontinued        1 g 200 mL/hr over 30 Minutes Intravenous Daily at bedtime 11/07/21 0812 11/08/21 1520   11/06/21 1000  molnupiravir EUA (LAGEVRIO) capsule 800 mg        4 capsule Oral 2 times daily 11/06/21 0332 11/11/21 0959        Medications  Scheduled Meds:  vitamin C  500 mg Per Tube Daily   aspirin  81 mg Per Tube Daily   Chlorhexidine Gluconate Cloth  6 each Topical Daily   cholecalciferol  1,000 Units Oral Daily   doxercalciferol  6 mcg Intravenous Q M,W,F-HD   famotidine  20 mg Oral Daily   ferric citrate  630 mg Oral TID WC   lactulose  30 g Per Tube BID   molnupiravir EUA  4 capsule Oral BID   predniSONE  15 mg Oral Q breakfast   zinc sulfate  220 mg Per Tube Daily   Continuous Infusions:  sodium chloride     sodium chloride     acyclovir (ZOVIRAX) </= 700 mg IVPB     cefTRIAXone (ROCEPHIN)  IV 200 mL/hr at 11/09/21 1500   dextrose 5 % and 0.9% NaCl Stopped (11/09/21 1638)    PRN Meds:.sodium chloride, sodium chloride, acetaminophen, colchicine, diphenhydrAMINE, heparin, heparin, hydrALAZINE, lidocaine (PF), lidocaine-prilocaine, metoCLOPramide, naLOXone (NARCAN)  injection, ondansetron **OR** ondansetron (ZOFRAN) IV, pentafluoroprop-tetrafluoroeth    Subjective:   [name] [name] was seen and examined today. Alert and oriented to place and person and answering questions appropriately.   She is requesting oxycodone.  Objective:   Vitals:   11/10/21 0745 11/10/21 0748 11/10/21 0800 11/10/21 0815  BP: (!) 173/83  (!) 184/82 (!) 184/82  Pulse: 92  97 96  Resp: (!) 23  (!) 23 (!) 24  Temp:  98.1 F (36.7 C)    TempSrc:  Oral    SpO2: 96%  95% 96%  Weight:      Height:        Intake/Output Summary (Last 24 hours) at 11/10/2021 0820 Last data filed at 11/10/2021 0300 Gross per 24 hour  Intake 191.24 ml  Output --  Net 191.24 ml   Filed Weights   11/06/21 0221 11/07/21 0300 11/07/21 0700  Weight: 61.2 kg 56.5 kg 53.4 kg     Exam General exam: ill appearing, not in distress.  Respiratory system: Clear to auscultation. Respiratory effort normal. Cardiovascular system: S1 & S2 heard, RRR. No JVD,. No pedal edema. Gastrointestinal system: Abdomen is nondistended, soft and nontender.. Normal bowel sounds heard. Central nervous system: Alert and oriented to person and place, grossly non focal neuro exam. No aphasia.  Extremities: no pedal edema.  Skin: No rashes seen.  Psychiatry:  anxious and requesting oxycodone.  Data Reviewed:  I have personally reviewed following labs and imaging studies   CBC Lab Results  Component Value Date   WBC 3.2 (L) 11/10/2021   RBC 4.29 11/10/2021   HGB 12.8 11/10/2021   HCT 39.8 11/10/2021   MCV 92.8 11/10/2021   MCH 29.8 11/10/2021   PLT 50 (L) 11/10/2021   MCHC 32.2 11/10/2021   RDW 13.3 11/10/2021   LYMPHSABS PENDING 11/10/2021   MONOABS PENDING 11/10/2021   EOSABS PENDING 11/10/2021   BASOSABS PENDING  32/67/1245     Last metabolic panel Lab Results  Component Value Date   NA 141 11/10/2021   K 5.1 11/10/2021   CL 105 11/10/2021   CO2 10 (L) 11/10/2021   BUN 137 (H) 11/10/2021   CREATININE 12.26 (H) 11/10/2021   GLUCOSE 117 (H) 11/10/2021   GFRNONAA 4 (L) 11/10/2021   GFRAA 3 (L) 05/14/2020   CALCIUM 7.7 (L) 11/10/2021   PHOS 8.9 (H) 11/10/2021   PROT 7.0 11/09/2021   ALBUMIN 2.9 (L) 11/10/2021   BILITOT 0.5 11/09/2021   ALKPHOS 58 11/09/2021   AST 44 (H) 11/09/2021   ALT 25 11/09/2021   ANIONGAP 26 (H) 11/10/2021    CBG (last 3)  Recent Labs    11/09/21 2333 11/10/21 0408 11/10/21 0746  GLUCAP 117* 134* 121*      Coagulation Profile: No results for input(s): INR, PROTIME in the last 168 hours.   Radiology Studies: CT HEAD WO CONTRAST (5MM)  Result Date: 11/10/2021 CLINICAL DATA:  Follow-up intracranial hemorrhage EXAM: CT HEAD WITHOUT CONTRAST TECHNIQUE: Contiguous axial images were obtained from the base of the skull through the vertex without intravenous contrast. RADIATION DOSE REDUCTION: This exam was performed according to the departmental dose-optimization program which includes automated exposure control, adjustment of the mA and/or kV according to patient size and/or use of iterative reconstruction technique. COMPARISON:  Brain MRI from earlier today FINDINGS: Brain: The extra-axial anterior left frontal abnormality is confirmed as intracranial hemorrhage in measures up to 8 mm in maximal thickness with mild local mass effect. Agree with the subdural location. Known bilateral para median cerebellar infarcts. No hydrocephalus or shift. Vascular: Premature arterial calcification. Skull: Normal. Negative for fracture or focal lesion. Sinuses/Orbits: No acute finding. IMPRESSION: 1. Confirmed anterior left frontal subdural hemorrhage measuring up to 8 mm in thickness. 2. Known small cerebellar infarcts. Electronically Signed   By: Jorje Guild M.D.   On:  11/10/2021 04:03   MR BRAIN WO CONTRAST  Result Date: 11/09/2021 EXAM: MRI HEAD WITHOUT CONTRAST TECHNIQUE: Multiplanar, multiecho pulse sequences of the brain and surrounding structures were obtained without intravenous contrast. COMPARISON:  None. FINDINGS: Motion limited study. Also, incomplete study due to patient intolerance. Axial T1 and coronal T2 sequences were not performed. Brain: Small foci of restricted diffusion in the posterior paramidline cerebellum, concerning for acute infarcts (series 3, image 15). Additional punctate focus of restricted diffusion in the splenium of the corpus callosum (series 3, image 29). Along the left frontal convexity there is an approximately 1.0 x 2.5 cm lobulated area of T2/FLAIR hyperintensity with associated susceptibility artifact, which may be extra-axial. This area is T1 isointense. No significant mass effect. No midline shift. No hydrocephalus. Small remote infarcts in the right pons, inferior cerebellum, and right basal ganglia. Vascular: Major arterial flow voids are maintained at the skull base. Skull and upper cervical spine: Normal marrow signal. Sinuses/Orbits: Clear sinuses.  Unremarkable orbits. Other: Small bilateral mastoid effusions. IMPRESSION: Incomplete and motion limited study, as  detailed above. 1. Along the left frontal convexity there is a 1.0 x 2.5 cm area of T2/FLAIR hyperintensity and susceptibility artifact, which appears to be extra-axial. This finding is most suspicious for recent hemorrhage, probably subdural in location. No significant mass effect. A repeat CT head could assess for hyperdense blood products in this region. If the CT head does not confirm hemorrhage, then follow-up postcontrast MRI imaging is recommended to exclude a mass (such as a meningioma). 2. Small acute infarcts in the posterior paramidline cerebellum bilaterally. 3. Additional punctate focus of restricted diffusion in the splenium of the corpus callosum, which  likely represents a cytotoxic lesion of the corpus callosum ([place]). This has a broad differential that includes the sequela of seizures, metabolic disturbance, infection, or sequela of drugs/toxins. 4. Small remote infarcts in the right pons, inferior cerebellum, and right basal ganglia. These results will be called to the ordering clinician or representative by the Radiologist Assistant, and communication documented in the PACS or [employer]. Electronically Signed   By: Margaretha Sheffield M.D.   On: 11/09/2021 18:18   DG Pelvis Portable  Result Date: 11/08/2021 CLINICAL DATA:  Foreign body screening. EXAM: PORTABLE PELVIS 1-2 VIEWS COMPARISON:  Abdominal x-ray 11/07/2021. FINDINGS: There is no evidence for radiopaque foreign body. There are extensive vascular calcifications in the pelvis and lower extremities. No acute fractures. Catheter overlies the right mid abdomen. IMPRESSION: 1. No radiopaque foreign body identified. 2. Extensive vascular calcifications. Electronically Signed   By: Ronney Asters M.D.   On: 11/08/2021 17:52   DG CHEST PORT 1 VIEW  Result Date: 11/09/2021 CLINICAL DATA:  Shortness of breath. EXAM: PORTABLE CHEST 1 VIEW COMPARISON:  November 07, 2021. FINDINGS: Stable cardiomegaly. Stable bibasilar opacities are noted. Bony thorax is unremarkable. IMPRESSION: Stable bibasilar opacities are noted concerning for possible edema, atelectasis or possibly infiltrates. Electronically Signed   By: Marijo Conception M.D.   On: 11/09/2021 08:09   Overnight EEG with video  Result Date: 11/08/2021 Lora Havens, MD     11/09/2021  8:50 AM Patient Name: Sarabi Sockwell MRN: [id number] Epilepsy Attending: Lora Havens Referring Physician/Provider: Amie Portland, MD Duration: 11/07/2021 2211 to 11/08/2021 2211  Patient history: 41 year old who has had a few days worth of feeling unwell and was brought into the hospital for altered mental status. EEG to evaluate for seizure  Level of alertness:   lethargic  AEDs during EEG study: None  Technical aspects: This EEG study was done with scalp electrodes positioned according to the 10-20 International system of electrode placement. Electrical activity was acquired at a sampling rate of '500Hz'$  and reviewed with a high frequency filter of '70Hz'$  and a low frequency filter of '1Hz'$ . EEG data were recorded continuously and digitally stored.  Description: At the beginning of the study, EEG showed generalized periodic discharges with triphasic morphology at 2.5 Hz.  IV Ativan 2 mg was administered at 1840 on 11/07/2021 after which EEG showed continuous generalized 3 to 5 Hz theta-delta slowing. Intermittent generalized periodic discharges with triphasic morphology were also noted at 1.5-2 Hz, more prominent when awake/stimulated. Hyperventilation and photic stimulation were not performed.    ABNORMALITY - Periodic discharges with triphasic morphology, generalized ( GPDs) - Continuous slow, generalized  IMPRESSION: At the beginning of the study, EEG showed generalized periodic discharges with triphasic morphology at 2.5 Hz which was on the ictal-interictal continuum.  IV Ativan 2 mg was administered at [place] on 11/07/2021 after which EEG improved. This study then  showed generalized periodic discharges with triphasic morphology at 1.5-2 Hz which were more prominent when patient was awake/stimulated.  This EEG pattern is most likely due to toxic-metabolic causes like hyperammonemia, uremia, cefepime toxicity.  Additionally, this study is suggestive of severe diffuse encephalopathy, nonspecific etiology. No definite seizures were seen throughout the recording. Priyanka Barbra Sarks   US Abdomen Limited RUQ (LIVER/GB)  Result Date: 11/08/2021 CLINICAL DATA:  Increased ammonia EXAM: ULTRASOUND ABDOMEN LIMITED RIGHT UPPER QUADRANT COMPARISON:  CT 12/20/2018 FINDINGS: Gallbladder: Small layering stones in the gallbladder. Normal wall thickness. Negative sonographic Murphy. Common bile  duct: Diameter: 2.3 mm Liver: No focal lesion identified. Within normal limits in parenchymal echogenicity. Portal vein is patent on color Doppler imaging with normal direction of blood flow towards the liver. Other: Atrophic echogenic native right kidney partially visualized. IMPRESSION: 1. Small gallstones without sonographic evidence for acute gallbladder disease 2. Otherwise negative examination Electronically Signed   By: Donavan Foil M.D.   On: 11/08/2021 22:35       Hosie Poisson M.D. [name] 11/10/2021, 8:20 AM  Available via Epic secure chat 7am-7pm After 7 pm, please refer to night coverage provider listed on amion.

## 2021-11-10 NOTE — Evaluation (Addendum)
Occupational Therapy Evaluation ?Patient Details ?Name: [name] [name] ?MRN: [id number] ?DOB: 1981-05-06 ?Today's Date: 11/10/2021 ? ? ?History of Present Illness 41 y.o. female presented via EMS 11/05/20 with acute onset of altered mental status with confusion after friends had not heard from her in days. Arrived with pinpoint pupils and altered mental status and BP 187/79, COVID positive, possible narcotics overdose, given Narcan, found to be hyperammonemia EEG showing diffuse severe encephalopathy. MRI left frontal convexity area that appears to be consistent with a possibility of blood products versus a meningioma and cytotoxic lesion of the corpus callosum  PMH: end-stage renal disease on hemodialysis on TTS, s/p renal and pancreatic transplant, complicated by medication nonadherence and rejection, hypertension, gastroparesis, anxiety, anemia and narcotic abuse,  ? ?Clinical Impression ?  ?Pt admitted with the above diagnoses and presents with below problem list. Pt will benefit from continued acute OT to address the below listed deficits and maximize independence with basic ADLs prior to d/c home. Difficult to obtain reliable home setup and PLOF data. Pt reports she lives alone, owns a dog, drives. Evaluation limited by elevated blood pressure (204/88), nausea, and 10/10 headache, nursing notified. Pt presents with deficits in cognition, activity tolerance, and strength. Mod I with bed mobility this session and observed to come to long sitting position and tolerate for close to minute.  ?  ?   ? ?Recommendations for follow up therapy are one component of a multi-disciplinary discharge planning process, led by the attending physician.  Recommendations may be updated based on patient status, additional functional criteria and insurance authorization.  ? ?Follow Up Recommendations ? No OT follow up , pending progress ?  ?Assistance Recommended at Discharge PRN  ?Patient can return home with the following   ? ?   ?Functional Status Assessment ? Patient has had a recent decline in their functional status and demonstrates the ability to make significant improvements in function in a reasonable and predictable amount of time.  ?Equipment Recommendations ? None recommended by OT  ?  ?Recommendations for Other Services   ? ? ?  ?Precautions / Restrictions Precautions ?Precautions: Fall, watch BP ?Restrictions ?Weight Bearing Restrictions: No  ? ?  ? ?Mobility Bed Mobility ?Overal bed mobility: Modified Independent ?  ?  ?  ?  ?  ?  ?General bed mobility comments: able to roll L and R and come to long sitting with no outside assist ?  ? ?Transfers ?  ?  ?  ?  ?  ?  ?  ?  ?  ?General transfer comment: deferred due to high BP and c/o of headache in presence of MRI results of possible brain hemmorrhage and acute infarcts ?  ? ?  ?Balance   ?  ?  ?  ?  ?  ?  ?  ?  ?  ?  ?  ?  ?  ?  ?  ?  ?  ?  ?   ? ?ADL either performed or assessed with clinical judgement  ? ?ADL Overall ADL's : Needs assistance/impaired ?Eating/Feeding: Set up ?  ?Grooming: Minimal assistance;Set up ?  ?Upper Body Bathing: Set up;Minimal assistance ?  ?Lower Body Bathing: Set up ?  ?Upper Body Dressing : Set up;Sitting ?  ?Lower Body Dressing: Set up ?  ?  ?  ?  ?  ?  ?  ?  ?General ADL Comments: mod I with bed mobility, observed to come to long sitting and maintain for a minute or  so. Assist d/t cognition>strength.  ? ? ? ?Vision   ?Additional Comments: difficulty to assess d/t cognition. squinting to try to focus on people standing near her  ?   ?Perception   ?  ?Praxis   ?  ? ?Pertinent Vitals/Pain Pain Assessment ?Pain Assessment: 0-10 ?Pain Score: 10-Worst pain ever ?Pain Location: head ?Pain Descriptors / Indicators: Headache ?Pain Intervention(s): Premedicated before session, Monitored during session, Limited activity within patient's tolerance, Repositioned  ? ? ? ?Hand Dominance Right ?  ?Extremity/Trunk Assessment Upper Extremity Assessment ?Upper  Extremity Assessment: Difficult to assess due to impaired cognition;LUE deficits/detail;RUE deficits/detail ?RUE Deficits / Details: full AROM observed during functional task ?RUE Coordination: decreased fine motor ?LUE Deficits / Details: full AROM observed during functional task ?LUE Coordination: decreased fine motor ?  ?Lower Extremity Assessment ?Lower Extremity Assessment: Defer to PT evaluation ?RLE Deficits / Details: ROM WFL, strength grossly 3+/5 ?RLE Sensation: decreased light touch ?RLE Coordination: decreased fine motor ?LLE Deficits / Details: ROM WFL, strength grossly 3+/5 ?LLE Sensation: decreased light touch ?LLE Coordination: decreased fine motor ?  ?  ?  ?Communication Communication ?Communication: No difficulties ?  ?Cognition Arousal/Alertness: Lethargic ?Behavior During Therapy: Flat affect ?Overall Cognitive Status: Impaired/Different from baseline ?Area of Impairment: Orientation, Memory, Following commands, Attention, Awareness, Problem solving ?  ?  ?  ?  ?  ?  ?  ?  ?Orientation Level: Disoriented to, Time, Situation ?Current Attention Level: Selective ?Memory: Decreased short-term memory ?Following Commands: Follows one step commands with increased time, Follows multi-step commands with increased time ?  ?  ?Problem Solving: Slow processing, Decreased initiation, Difficulty sequencing, Requires verbal cues, Requires tactile cues ?  ?  ?  ?General Comments  BP 202/90, 204/88, 196/93, c/o of dizziness, RN notified of elevated BP HR 83, 100% SpO2 on RA, ? ?  ?Exercises   ?  ?Shoulder Instructions    ? ? ?Home Living Family/patient expects to be discharged to:: Private residence ?Living Arrangements: Alone ?  ?Type of Home: House ?Home Access: Stairs to enter ?Entrance Stairs-Number of Steps: 2 ?Entrance Stairs-Rails: Can reach both ?Home Layout: Two level;Bed/bath upstairs ?  ?  ?Bathroom Shower/Tub: Walk-in shower ?  ?  ?  ?  ?Home Equipment: None ?  ?  ?  ? ?  ?Prior  Functioning/Environment Prior Level of Function : Driving;Patient poor historian/Family not available ?  ?  ?  ?  ?  ?  ?Mobility Comments: reports able to walk dog and drive herself to dialysis ?ADLs Comments: reports independence with medication management, and self care ?  ? ?  ?  ?OT Problem List:   ?  ?   ?OT Treatment/Interventions:    ?  ?OT Goals(Current goals can be found in the care plan section) Acute Rehab OT Goals ?Patient Stated Goal: not stated ?OT Goal Formulation: With patient ?Time For Goal Achievement: 11/24/21 ?Potential to Achieve Goals: Good  ?OT Frequency:   ?  ? ?Co-evaluation PT/OT/SLP Co-Evaluation/Treatment: Yes ?Reason for Co-Treatment: Necessary to address cognition/behavior during functional activity;For patient/therapist safety ?PT goals addressed during session: Mobility/safety with mobility ?OT goals addressed during session: ADL's and self-care ?  ? ?  ?AM-PAC OT "6 Clicks" Daily Activity     ?Outcome Measure   ?  ?  ?  ?  ?  ?  ?  ?End of Session   ? ?Activity Tolerance:   ?Patient left:   ? ?   ?              ?  Time: 1107-1140 ?OT Time Calculation (min): 33 min ?Charges:  OT General Charges ?$OT Visit: 1 Visit ?OT Evaluation ?$OT Eval Moderate Complexity: 1 Mod ? ?Tyrone Schimke, OT ?[facility] Services ?Office: [phone] ? ? ?Tyrone Schimke H ?11/10/2021, 1:54 PM ?

## 2021-11-11 ENCOUNTER — Inpatient Hospital Stay (HOSPITAL_COMMUNITY): Payer: Medicare Other

## 2021-11-11 DIAGNOSIS — I6389 Other cerebral infarction: Secondary | ICD-10-CM | POA: Diagnosis not present

## 2021-11-11 LAB — CBC WITH DIFFERENTIAL/PLATELET
Abs Immature Granulocytes: 0.03 10*3/uL (ref 0.00–0.07)
Basophils Absolute: 0 10*3/uL (ref 0.0–0.1)
Basophils Relative: 0 %
Eosinophils Absolute: 0 10*3/uL (ref 0.0–0.5)
Eosinophils Relative: 0 %
HCT: 37.6 % (ref 36.0–46.0)
Hemoglobin: 12.8 g/dL (ref 12.0–15.0)
Immature Granulocytes: 1 %
Lymphocytes Relative: 22 %
Lymphs Abs: 0.8 10*3/uL (ref 0.7–4.0)
MCH: 30.4 pg (ref 26.0–34.0)
MCHC: 34 g/dL (ref 30.0–36.0)
MCV: 89.3 fL (ref 80.0–100.0)
Monocytes Absolute: 0.3 10*3/uL (ref 0.1–1.0)
Monocytes Relative: 7 %
Neutro Abs: 2.5 10*3/uL (ref 1.7–7.7)
Neutrophils Relative %: 70 %
Platelets: 61 10*3/uL — ABNORMAL LOW (ref 150–400)
RBC: 4.21 MIL/uL (ref 3.87–5.11)
RDW: 12.7 % (ref 11.5–15.5)
WBC: 3.5 10*3/uL — ABNORMAL LOW (ref 4.0–10.5)
nRBC: 0 % (ref 0.0–0.2)

## 2021-11-11 LAB — GLUCOSE, CAPILLARY
Glucose-Capillary: 104 mg/dL — ABNORMAL HIGH (ref 70–99)
Glucose-Capillary: 103 mg/dL — ABNORMAL HIGH (ref 70–99)
Glucose-Capillary: 125 mg/dL — ABNORMAL HIGH (ref 70–99)
Glucose-Capillary: 162 mg/dL — ABNORMAL HIGH (ref 70–99)
Glucose-Capillary: 128 mg/dL — ABNORMAL HIGH (ref 70–99)

## 2021-11-11 LAB — RENAL FUNCTION PANEL
Albumin: 2.9 g/dL — ABNORMAL LOW (ref 3.5–5.0)
Anion gap: 17 — ABNORMAL HIGH (ref 5–15)
BUN: 68 mg/dL — ABNORMAL HIGH (ref 6–20)
CO2: 21 mmol/L — ABNORMAL LOW (ref 22–32)
Calcium: 8.4 mg/dL — ABNORMAL LOW (ref 8.9–10.3)
Chloride: 101 mmol/L (ref 98–111)
Creatinine, Ser: 6.74 mg/dL — ABNORMAL HIGH (ref 0.44–1.00)
GFR, Estimated: 7 mL/min — ABNORMAL LOW (ref >60)
Glucose, Bld: 108 mg/dL — ABNORMAL HIGH (ref 70–99)
Phosphorus: 7.7 mg/dL — ABNORMAL HIGH (ref 2.5–4.6)
Potassium: 3.4 mmol/L — ABNORMAL LOW (ref 3.5–5.1)
Sodium: 139 mmol/L (ref 135–145)

## 2021-11-11 LAB — LIPID PANEL
Cholesterol: 169 mg/dL (ref 0–200)
HDL: 26 mg/dL — ABNORMAL LOW (ref >40)
LDL Cholesterol: UNDETERMINED mg/dL (ref 0–99)
Total CHOL/HDL Ratio: 6.5 RATIO
Triglycerides: 439 mg/dL — ABNORMAL HIGH (ref <150)
VLDL: UNDETERMINED mg/dL (ref 0–40)

## 2021-11-11 LAB — ECHOCARDIOGRAM COMPLETE
AR max vel: 1.89 cm2
AV Peak grad: 11.3 mmHg
Ao pk vel: 1.68 m/s
Area-P 1/2: 3.61 cm2
Calc EF: 56.1 %
Height: 64 in
S' Lateral: 2.5 cm
Single Plane A2C EF: 56.4 %
Single Plane A4C EF: 56.7 %
Weight: 1883.61 oz

## 2021-11-11 LAB — HEMOGLOBIN A1C
Hgb A1c MFr Bld: 4.1 % — ABNORMAL LOW (ref 4.8–5.6)
Mean Plasma Glucose: 70.97 mg/dL

## 2021-11-11 LAB — CSF CULTURE W GRAM STAIN: Culture: NO GROWTH

## 2021-11-11 LAB — FERRITIN: Ferritin: 6000 ng/mL — ABNORMAL HIGH (ref 11–307)

## 2021-11-11 LAB — C-REACTIVE PROTEIN: CRP: 0.8 mg/dL (ref <1.0)

## 2021-11-11 LAB — LDL CHOLESTEROL, DIRECT: Direct LDL: 60.2 mg/dL (ref 0–99)

## 2021-11-11 IMAGING — MR MR MRA HEAD W/O CM
1 series · 19 of 48 positions shown · non-contrast
Comparison: Prior brain MRI from [DATE].

CLINICAL DATA: Follow-up examination for acute stroke.

EXAM:
MRA HEAD WITHOUT CONTRAST
TECHNIQUE: Angiographic images of the Circle of Willis were acquired using MRA
technique without intravenous contrast.

[Series 5: 3d cow · axial · 0.5mm · 0.41mm/px · z∈[-41,+48]mm · 19 of 188 slices shown]
[im 1/188]
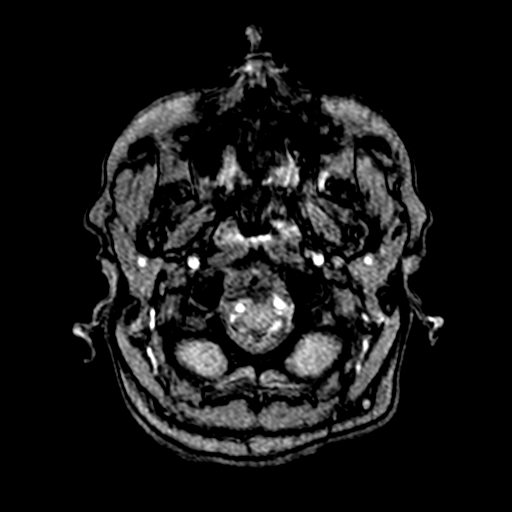
[im 4/188]
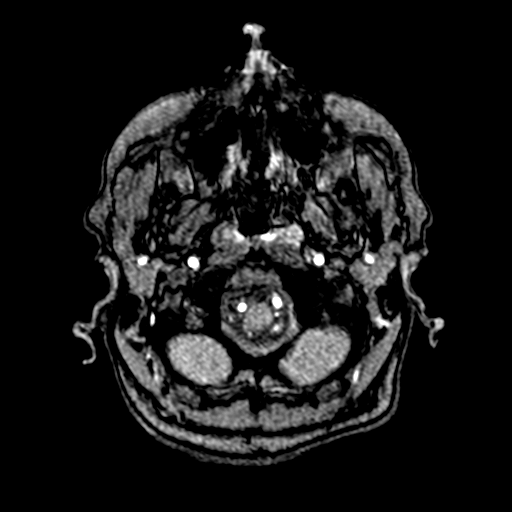
[im 8/188]
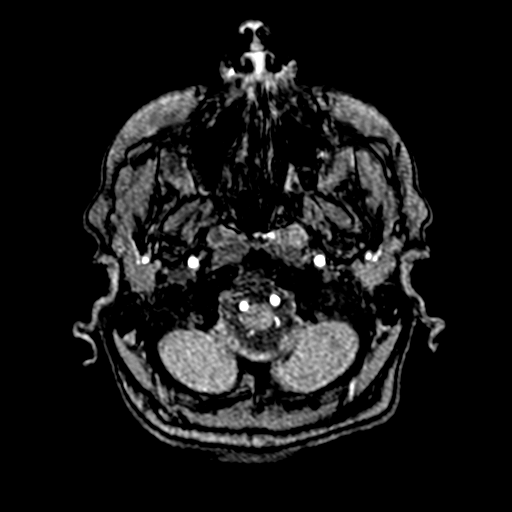
[im 12/188]
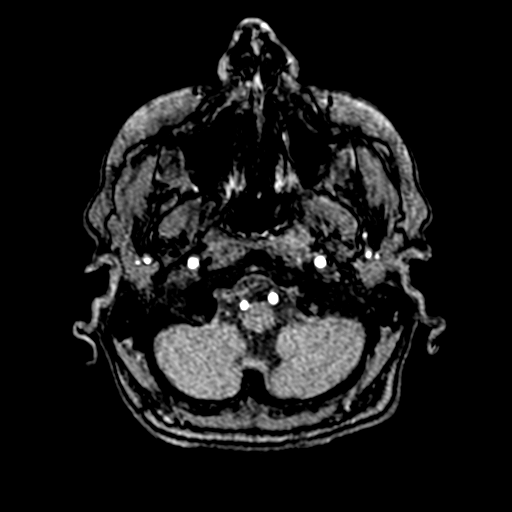
[im 16/188]
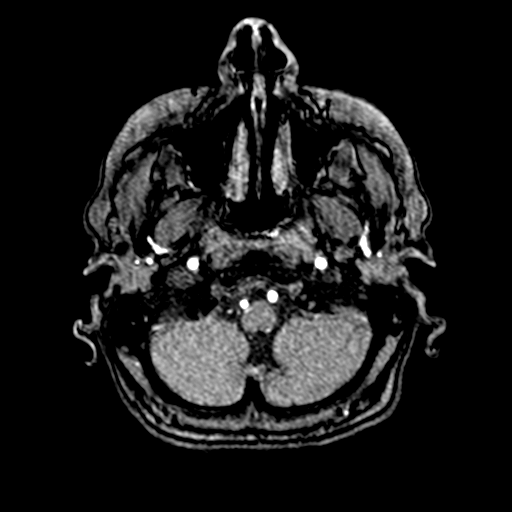
[im 20/188]
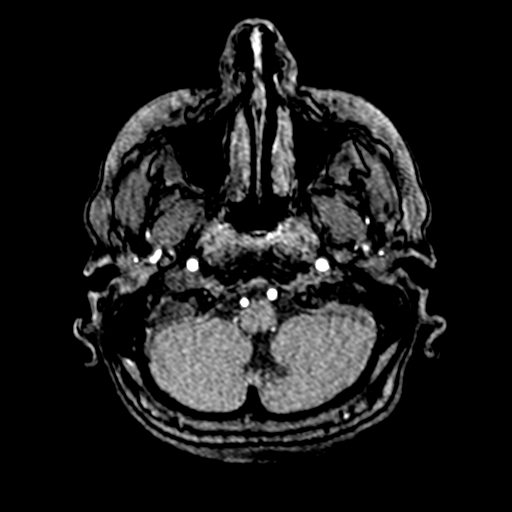
[im 24/188]
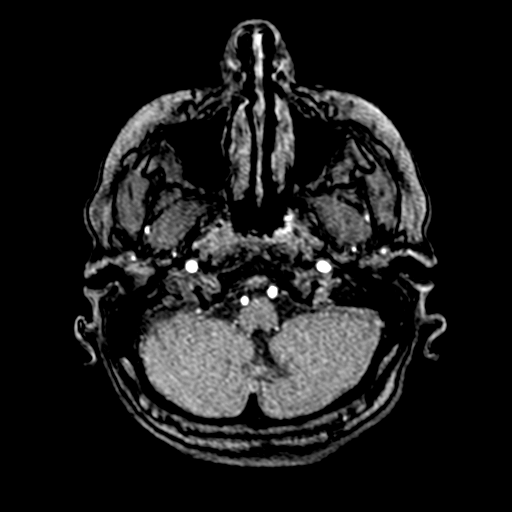
[im 28/188]
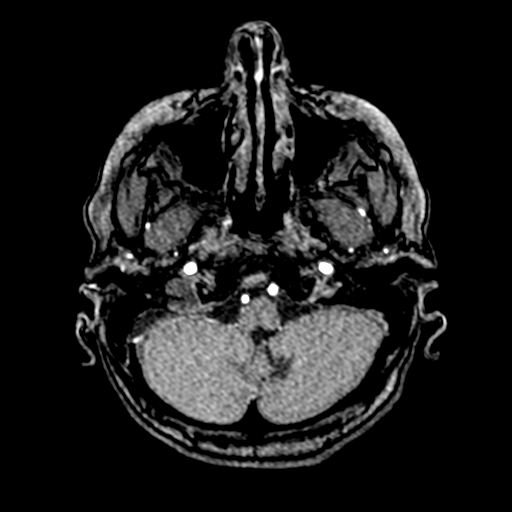
[im 32/188]
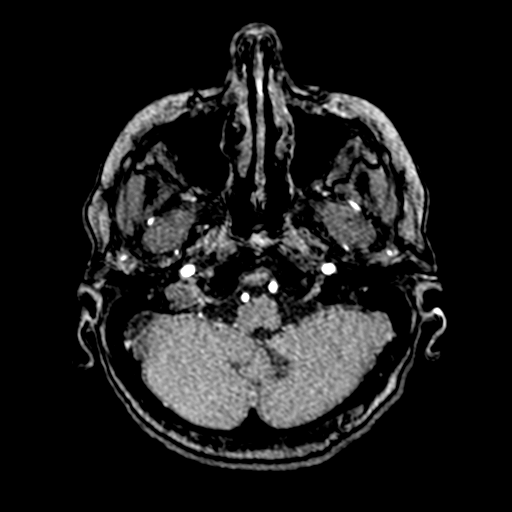
[im 36/188]
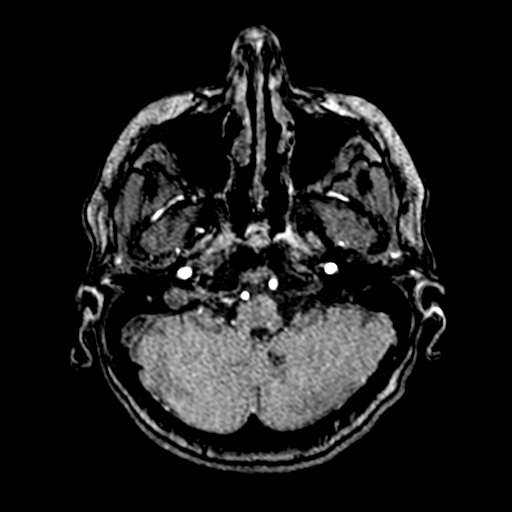
[im 40/188]
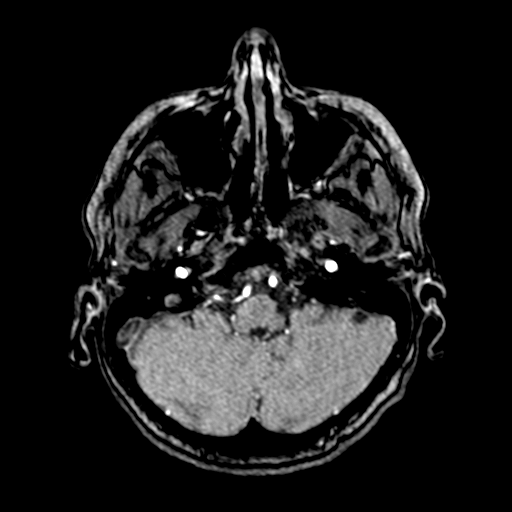
[im 60/188]
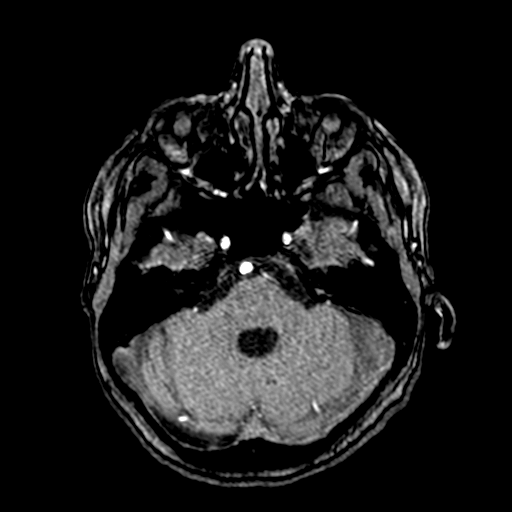
[im 84/188]
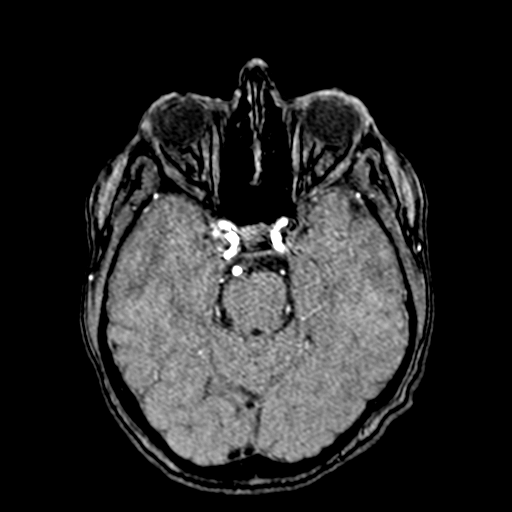
[im 96/188]
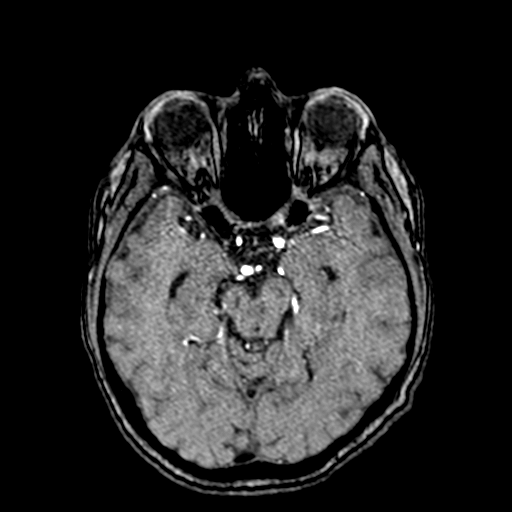
[im 108/188]
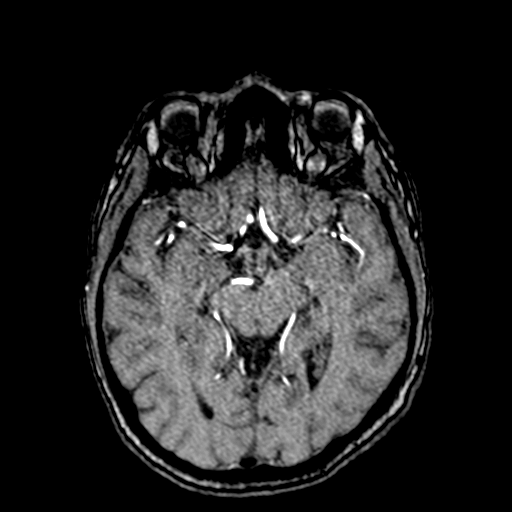
[im 132/188]
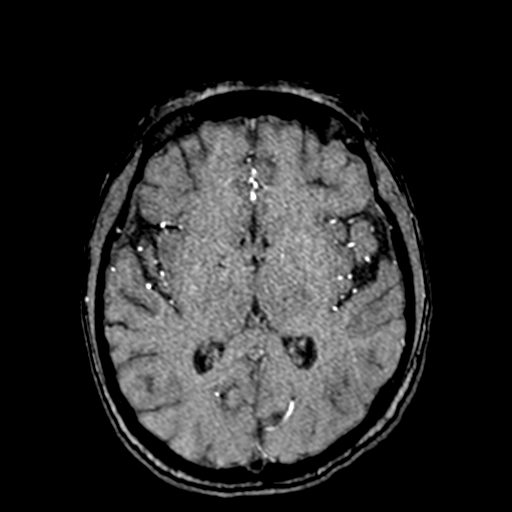
[im 156/188]
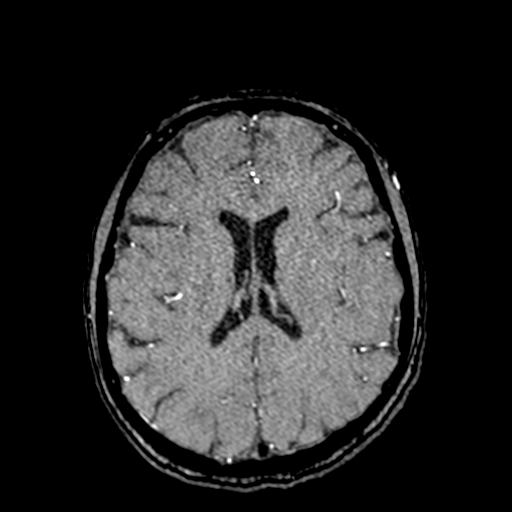
[im 160/188]
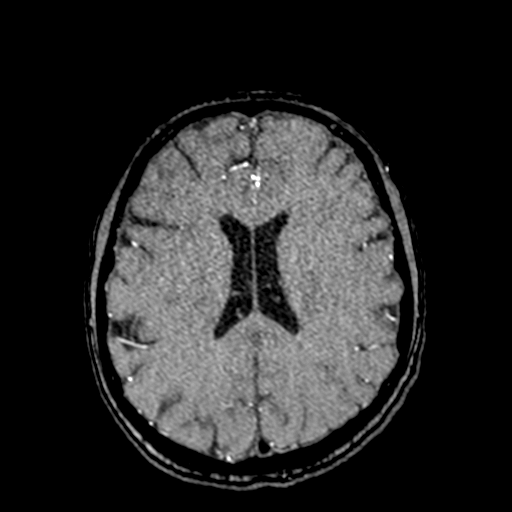
[im 180/188]
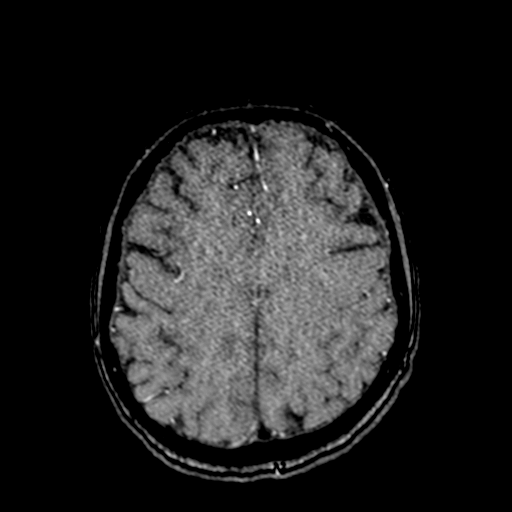

[19 of 48 positions shown; findings below may reference images not displayed]

FINDINGS: Anterior circulation: Examination mildly degraded by motion.

Visualized distal cervical segments of both internal carotid
arteries widely patent with antegrade flow. Petrous segments widely
patent bilaterally. Atheromatous irregularity within the carotid
siphons without hemodynamically significant stenosis. A1 segments
widely patent. Normal anterior communicating artery complex. Mild
atheromatous irregularity within the ACAs without significant
stenosis. No M1 stenosis or occlusion. Normal right MCA bifurcation.
Left MCA trifurcates. No proximal MCA branch occlusion. Distal MCA
branches well perfused and symmetric, although demonstrates small
vessel atheromatous irregularity.

Posterior circulation: Visualized V4 segments widely patent without
stenosis. Left vertebral artery slightly dominant. Right PICA patent
at its origin. Left PICA origin not visualized. Basilar patent to
its distal aspect without stenosis. Superior cerebellar arteries
patent bilaterally. Both PCAs primarily supplied via the basilar
well perfused or distal aspects.

Anatomic variants: None significant.

Other: No intracranial aneurysm.
IMPRESSION: 1. Negative MRA for large vessel occlusion or other acute finding.
2. Age advanced intracranial small vessel atheromatous disease, but
no hemodynamically significant or correctable stenosis.

## 2021-11-11 IMAGING — CT CT HEAD W/O CM
4 series · 16 of 47 positions shown, 18 images · non-contrast
Comparison: Prior CT and MRI from [DATE].

CLINICAL DATA: Follow-up examination for subdural hematoma.



[Series 3: head without · axial · non-contrast · 0.39mm/px · z∈[-220,-100]mm · 7 of 32 slices shown, 9 images]
[im 4/32  brain]
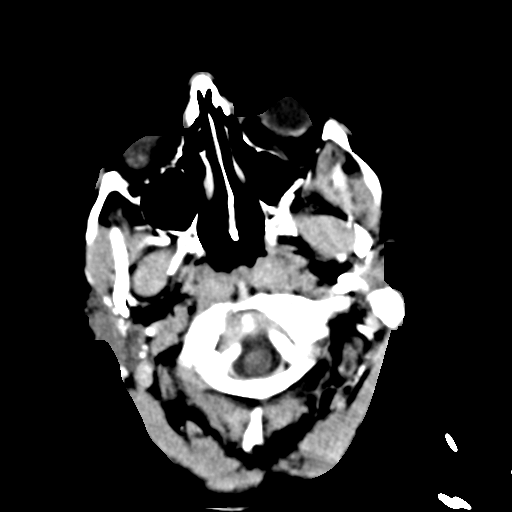
[im 4/32  bone]
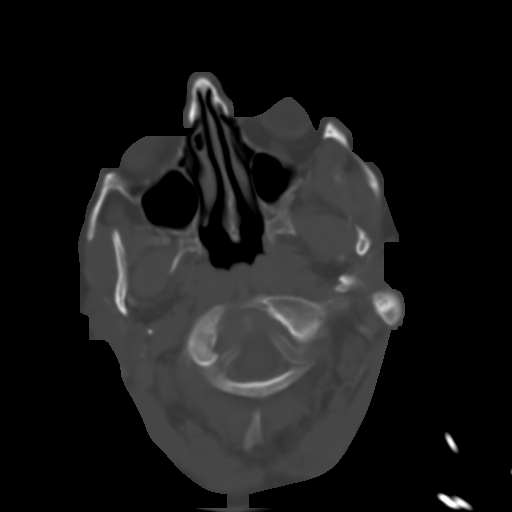
[im 8/32  brain]
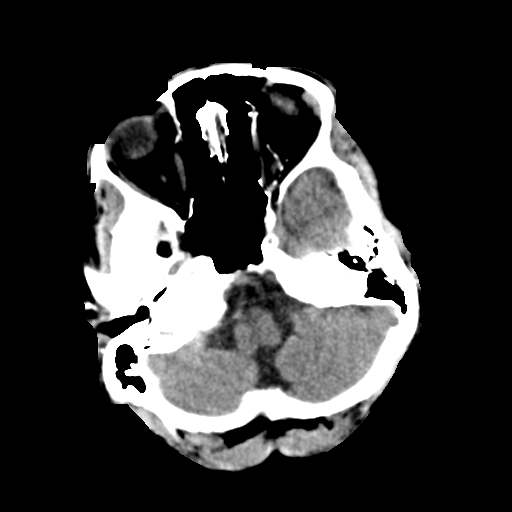
[im 12/32  brain]
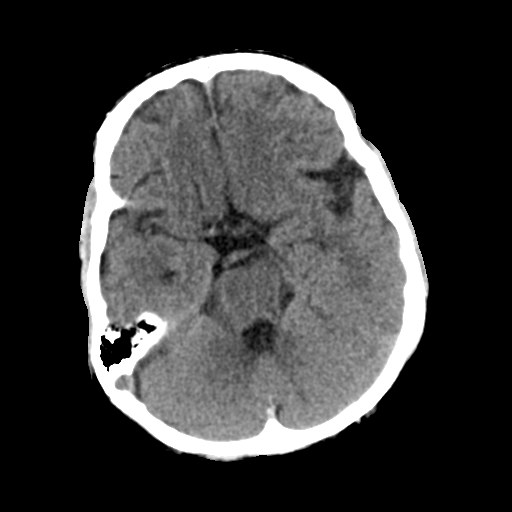
[im 16/32  brain]
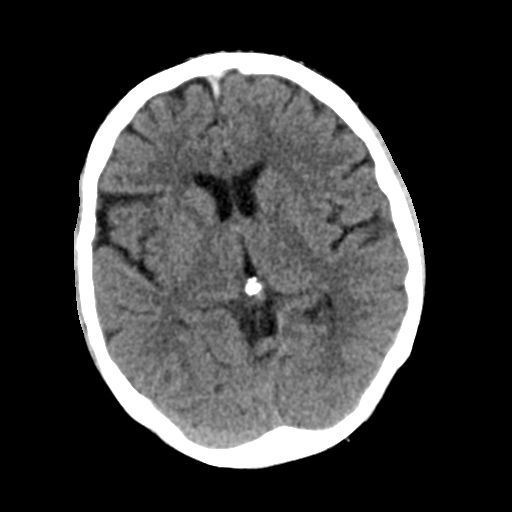
[im 20/32  brain]
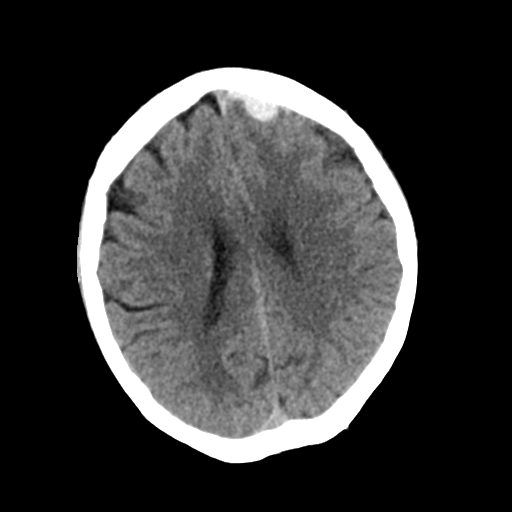
[im 20/32  bone]
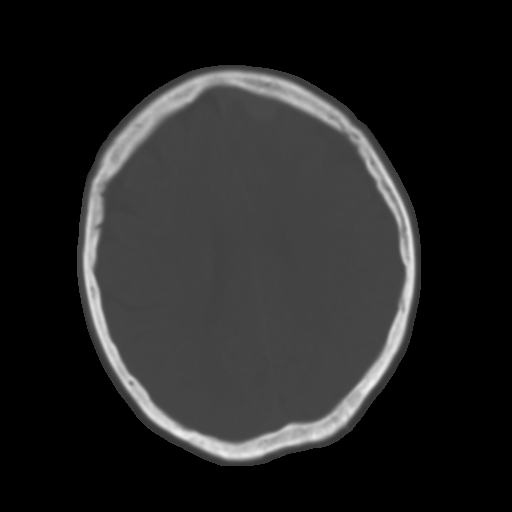
[im 24/32  brain]
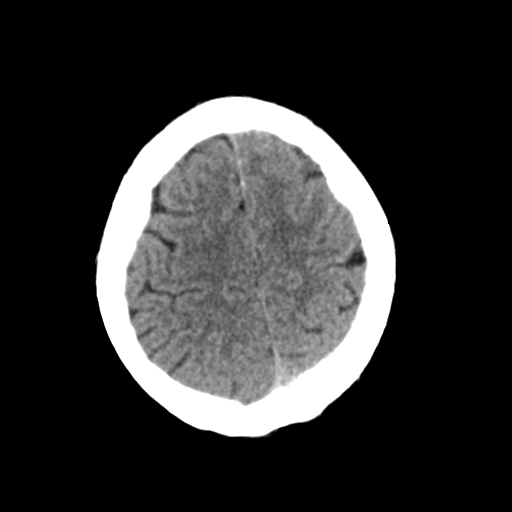
[im 28/32  brain]
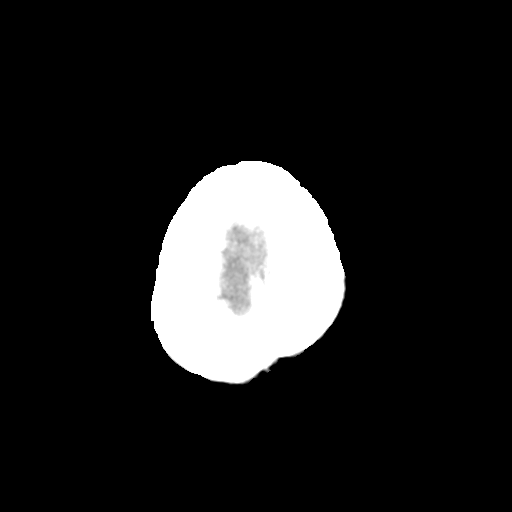

[Series 4: head bone · axial · 0.39mm/px · z∈[-221,-189]mm · 3 of 80 slices shown]
[im 8/80  bone]
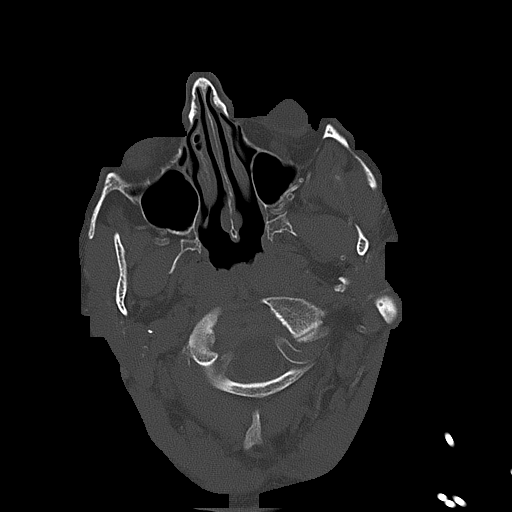
[im 16/80  bone]
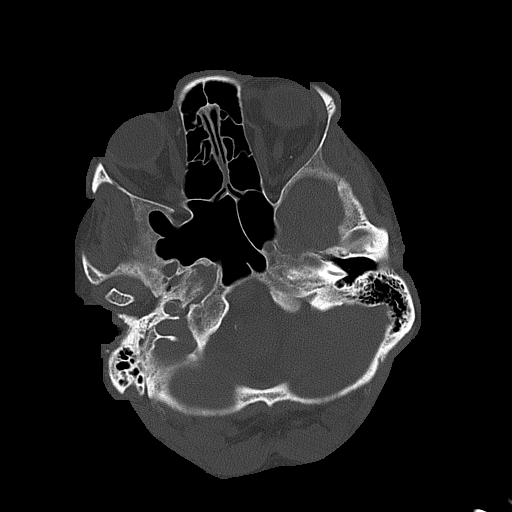
[im 24/80  bone]
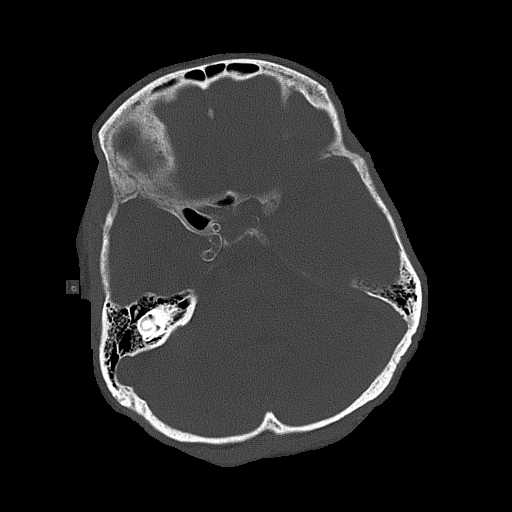

[Series 5: head without cor · coronal · non-contrast · 0.33mm/px · 3 of 67 slices shown]
[im 23/67  brain]
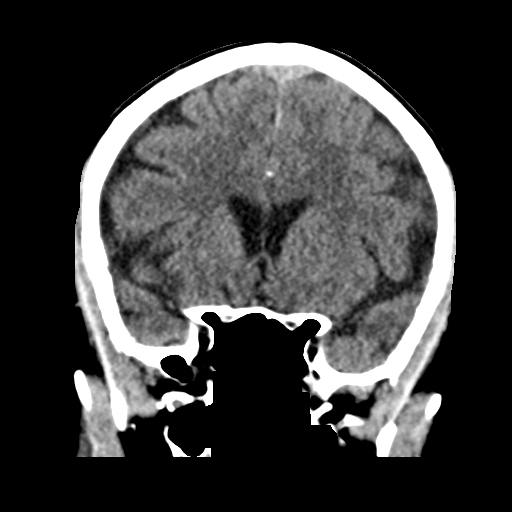
[im 30/67  brain]
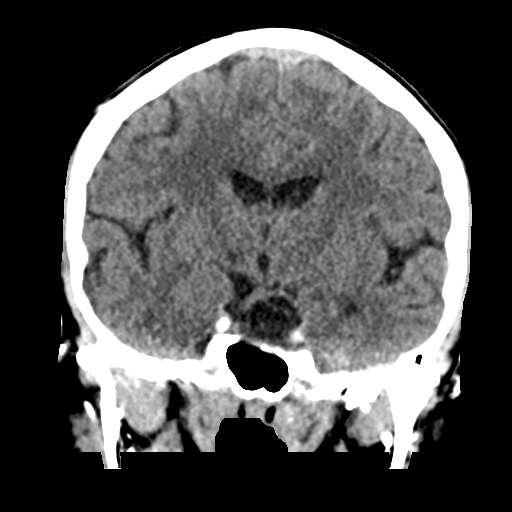
[im 37/67  brain]
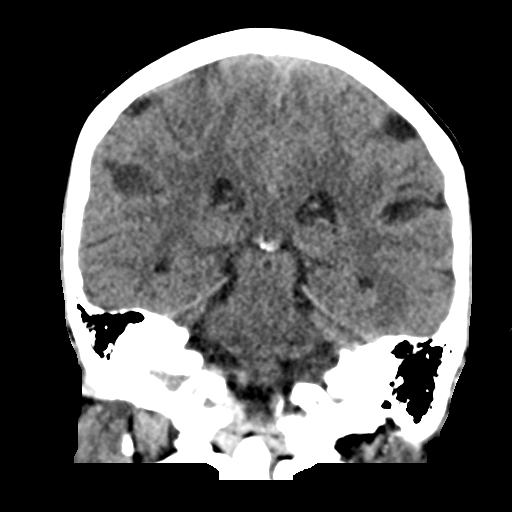

[Series 6: head without sag · sagittal · non-contrast · 0.32mm/px · 3 of 53 slices shown]
[im 18/53  brain]
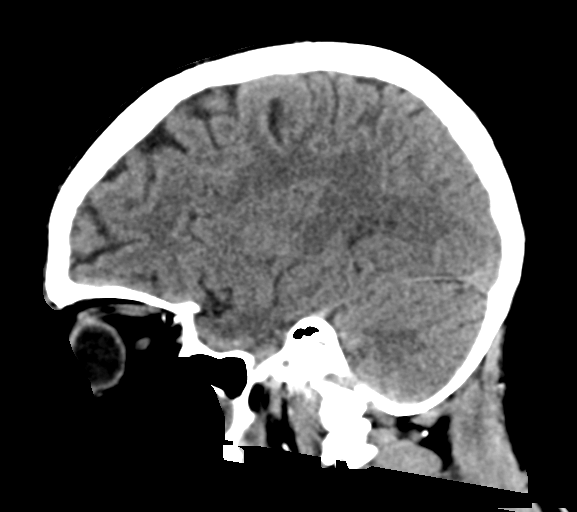
[im 27/53  brain]
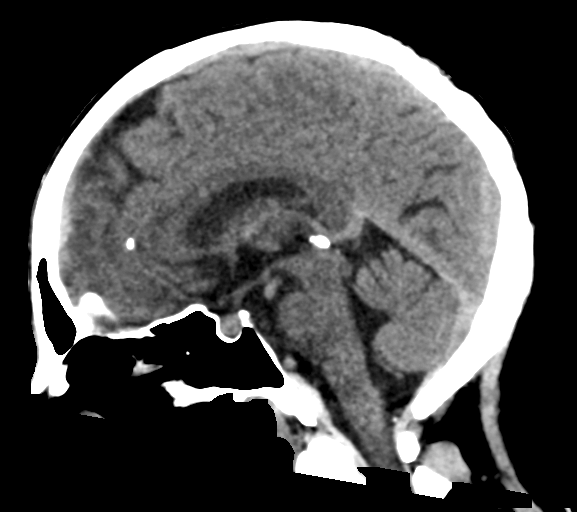
[im 35/53  brain]
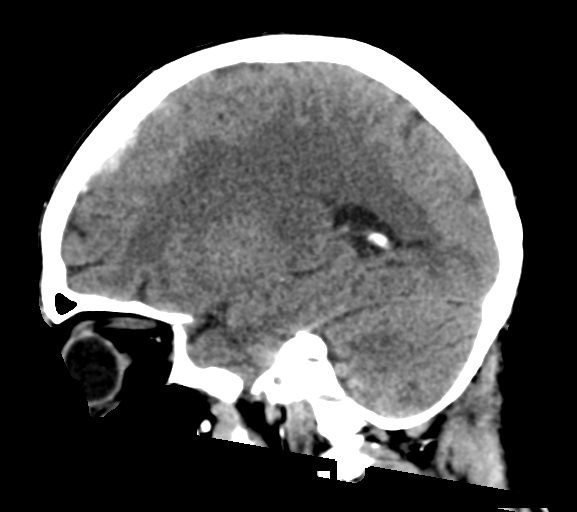

[16 of 47 positions shown; findings below may reference images not displayed]

FINDINGS: Brain: Previously identified extra-axial hemorrhage overlying the
anterior left frontal convexity again seen, not significantly
changed in size or morphology measuring up to 9 mm in maximal
thickness. Slight extension along the adjacent anterior falx,
suggesting that this is subdural in nature. No significant regional
mass effect or midline shift.

No other new intracranial hemorrhage. Normal expected interval
evolution of previously identified cerebellar infarcts. No other new
large vessel territory infarct. No hydrocephalus.

Vascular: Age advanced atherosclerotic calcifications about the
skull base and visualized head.

Skull: No scalp soft tissue abnormality. Calvarium intact without
abnormality.

Sinuses/Orbits: Globes and orbital soft tissues demonstrate no acute
finding. Paranasal sinuses and mastoid air cells remain clear.

Other: None.
IMPRESSION: 1. No significant interval change in size and morphology of
extra-axial hemorrhage overlying the anterior left frontal
convexity, measuring up to 9 mm in maximal thickness. No significant
regional mass effect or midline shift. No new hemorrhage.
2. Normal expected interval evolution of previously identified
cerebellar infarcts.
3. No other new acute intracranial abnormality.

## 2021-11-11 MED ORDER — LORAZEPAM 2 MG/ML IJ SOLN
1.0000 mg | INTRAMUSCULAR | Status: AC
  Administered 2021-11-11: 1 mg via INTRAVENOUS
  Filled 2021-11-11: qty 1

## 2021-11-11 MED ORDER — MORPHINE SULFATE (PF) 2 MG/ML IV SOLN
0.5000 mg | INTRAVENOUS | Status: DC | PRN
  Administered 2021-11-11: 0.5 mg via INTRAVENOUS
  Filled 2021-11-11: qty 1

## 2021-11-11 MED ORDER — CLONIDINE HCL 0.1 MG/24HR TD PTWK
0.1000 mg | MEDICATED_PATCH | TRANSDERMAL | Status: DC
  Administered 2021-11-11: 0.1 mg via TRANSDERMAL
  Filled 2021-11-11 (×2): qty 1

## 2021-11-11 MED ORDER — MORPHINE SULFATE (PF) 2 MG/ML IV SOLN
1.0000 mg | INTRAVENOUS | Status: DC | PRN
  Administered 2021-11-11 – 2021-11-13 (×4): 1 mg via INTRAVENOUS
  Filled 2021-11-11 (×4): qty 1

## 2021-11-11 MED ORDER — OXYCODONE HCL 5 MG PO TABS
5.0000 mg | ORAL_TABLET | Freq: Three times a day (TID) | ORAL | Status: DC | PRN
  Administered 2021-11-11 – 2021-11-15 (×9): 5 mg via ORAL
  Filled 2021-11-11 (×11): qty 1

## 2021-11-11 MED ORDER — FENTANYL CITRATE (PF) 100 MCG/2ML IJ SOLN
12.5000 ug | INTRAMUSCULAR | Status: DC | PRN
  Administered 2021-11-11: 12.5 ug via INTRAVENOUS
  Filled 2021-11-11: qty 2

## 2021-11-11 NOTE — Progress Notes (Signed)
Pt transported to and from CT and MRI without incident. ?

## 2021-11-11 NOTE — Progress Notes (Deleted)
SLP Cancellation Note ? ?Patient Details ?Name: [name] [name] ?MRN: [id number] ?DOB: [birth year] ? ? ?Cancelled treatment:       Reason Eval/Treat Not Completed:  (patient discharging to CIR) ? ? ?[name] ?11/11/2021, 11:17 AM ?

## 2021-11-11 NOTE — Progress Notes (Signed)
[name]                                                                               [name] [name], is a 41 y.o. female, DOB - 1981-08-04, YIR:[id number] Admit date - 11/05/2021    Outpatient Primary MD for the patient is Patient, No Pcp Per (Inactive)  LOS - 5  days    Brief summary   Patient is a 41 year old unfortunate female history of end-stage renal disease on HD on TTS, status post renal and pancreatic transplant, complicated by medical nonadherence and rejection, hypertension, gastroparesis, anxiety, anemia and narcotic abuse presented to the ED with acute onset of altered mental status and confusion.  It was noted that 1 of patient's friends had not heard from her for the last couple of days and EMS called.  When EMS got there patient noted to be conscious but disoriented.  Unknown as to whether patient had a last hemodialysis session.  Patient seen in the ED noted to have pinpoint pupils and altered mental status noted to be somnolent and arousable.  Patient given a dose of IV Narcan which significantly improved her mental status briefly.  Patient seen in the ED COVID-19 PCR obtained was positive.  Influenza PCR negative.  EKG showed normal sinus rhythm and some peaked T waves anterior laterally.  Initial high-sensitivity troponin was 76.  Chest x-ray showed mild pulmonary edema likely secondary to volume overload.  Noncontrast head CT negative. She was transferred to ICU for acute encephalopathy and worsening respiratory status.    Assessment & Plan    Assessment and Plan: * Acute toxic and metabolic  encephalopathy Acute ischemic stroke  Small left frontal sub dural hematoma of unclear etiology possibly coagulopathy.  - This is likely multifactorial.  It could be related to COVID encephalopathy as well as uremia with end-stage renal disease on hemodialysis with question about compliance and certainly polypharmacy with opiate overdose, given response to IV Narcan.,  vs from stroke and sub dural hematoma. -CT head negative for any acute abnormalities on admission.  MRI brain without contrast  on 3/8 showed Small acute infarcts in the posterior paramidline cerebellum bilaterally. She was also found to have anterior left frontal subdural hemorrhage, confirmed by a CT HEAD without contrast.  - Neurology on board and Neurosurgery consulted on 3/9. LP done and does not appear to be consistent with meningitis.  - HSV negative - d/c acyclovir.  - further stroke work up in progress with echocardiogram showed Left ventricular ejection fraction, by estimation, is 55 to 60%. The left ventricle has normal function. The left ventricle has no regional  wall motion abnormalities. There is severe concentric left ventricular  hypertrophy. Left ventricular diastolic  parameters are consistent with Grade I diastolic dysfunction .  carotid duplex pending.   and MRI angio head without contrast showed Negative MRA for large vessel occlusion or other acute finding. Age advanced intracranial small vessel atheromatous disease, but no hemodynamically significant. hemoglobin A1c  is 3.9% Lipid panel shows direct LDL of  60.  Unfortunately not a candidate for aspirin or plavix due to hematoma with low platelets.  Hypercoagulable work up sent by neurology.  Outpatient follow up with Dr Zada Finders  in 2 weeks and keep platelet count greater than 50,000.    ESRD on HD with Monday , Wednesday and Friday scheduled.  Nephrology on board and plan for HD later today.     Pulmonary edema - Pulmonary edema noted on chest x-ray likely secondary to fluid overload secondary to end-stage renal disease with probable noncompliance with hemodialysis.   -Nephrology following.  Hypertensive urgency-  BP parameters well controlled.  Added norvasc 10 mg daily, labetalol 300 mg TID, ( was taking 600 mg BID) and clonidine 0.1 mg BID.  On prn hydralazine.    Type 2 diabetes mellitus with peripheral  neuropathy (HCC) -Repeat hemoglobin A1c 3.9. CBG (last 3)  Recent Labs    11/11/21 0340 11/11/21 0846 11/11/21 1140  GLUCAP 104* 103* 125*    No changes in meds. Continue with SSI.    Gout - Colchicine as needed.  History of failed pancreas/ renal transplant -She is status post renal and pancreatic transplant with antirejection medication noncompliance. - She is currently being evaluated for renal transplant. - she was restarted on cyclosporine 200 mg during the day and 100 mg at night, in addition to prednisone 15 mg daily. - will restart myfortic tomorrow.   Fever on admission.  From CAP vs COVID infection.  Afebrile in the last 24 hours.  - completed 5 days of IV rocephin.   COVID-19 virus infection - Patient noted to be positive for COVID-19 infection -Follow inflammatory markers. -Patient chronically on prednisone. -completed the course of  molnupiravir. -Supportive care.   Hyperkalemia Resolved with HD.    Chronic thrombocytopenia:  - platelets have been around 120000 in 02/2021, dropped to 61,000 this admission.  - continue to monitor.  -she denies following up with hematology outpatient.   Anemia of chronic disease from ESRD Transfuse to keep hemoglobin greater than 7.   Pancytopenia: ? Secondary to immunosuppressants.  Continue to monitor.    Estimated body mass index is 20.21 kg/m as calculated from the following:   Height as of this encounter: '5\' 4"'$  (1.626 m).   Weight as of this encounter: 53.4 kg.  Code Status: full code.  DVT Prophylaxis:  Place and maintain sequential compression device Start: 11/06/21 0347   Level of Care: Level of care: Telemetry Medical Family Communication: None at bedside.   Disposition Plan:     Remains inpatient appropriate:  further work up for stroke.   Procedures:  MRI brain.   Consultants:   Neurology Nephrology.  Neuro surgery.   Antimicrobials:   Anti-infectives (From admission, onward)    Start      Dose/Rate Route Frequency Ordered Stop   11/10/21 1800  acyclovir (ZOVIRAX) 260 mg in dextrose 5 % 100 mL IVPB  Status:  Discontinued        260 mg 105.2 mL/hr over 60 Minutes Intravenous Every 24 hours 11/09/21 1231 11/10/21 0857   11/09/21 1500  cefTRIAXone (ROCEPHIN) 2 g in sodium chloride 0.9 % 100 mL IVPB        2 g 200 mL/hr over 30 Minutes Intravenous Every 24 hours 11/09/21 0840 11/11/21 1532   11/09/21 1400  acyclovir (ZOVIRAX) 260 mg in dextrose 5 % 100 mL IVPB        260 mg 105.2 mL/hr over 60 Minutes Intravenous Once 11/09/21 1231 11/09/21 1455   11/09/21 1200  vancomycin (VANCOREADY) IVPB 500 mg/100 mL  Status:  Discontinued        500 mg 100  mL/hr over 60 Minutes Intravenous Every M-W-F (Hemodialysis) 11/07/21 0917 11/09/21 0840   11/09/21 0000  piperacillin-tazobactam (ZOSYN) IVPB 2.25 g  Status:  Discontinued       See Hyperspace for full Linked Orders Report.   2.25 g 100 mL/hr over 30 Minutes Intravenous Every 8 hours 11/08/21 1520 11/09/21 0840   11/08/21 1800  acyclovir (ZOVIRAX) 260 mg in dextrose 5 % 100 mL IVPB  Status:  Discontinued        260 mg 105.2 mL/hr over 60 Minutes Intravenous Every 24 hours 11/08/21 0735 11/09/21 1231   11/08/21 1800  ampicillin (OMNIPEN) 2 g in sodium chloride 0.9 % 100 mL IVPB  Status:  Discontinued        2 g 300 mL/hr over 20 Minutes Intravenous Every 24 hours 11/08/21 0735 11/08/21 1505   11/08/21 1615  piperacillin-tazobactam (ZOSYN) IVPB 3.375 g       See Hyperspace for full Linked Orders Report.   3.375 g 100 mL/hr over 30 Minutes Intravenous  Once 11/08/21 1520 11/08/21 1624   11/07/21 1200  vancomycin (VANCOREADY) IVPB 500 mg/100 mL  Status:  Discontinued        500 mg 100 mL/hr over 60 Minutes Intravenous Every M-W-F (Hemodialysis) 11/07/21 0812 11/07/21 0917   11/07/21 1015  ampicillin (OMNIPEN) 2 g in sodium chloride 0.9 % 100 mL IVPB  Status:  Discontinued        2 g 300 mL/hr over 20 Minutes Intravenous Every 24 hours  11/07/21 0927 11/08/21 0735   11/07/21 1015  acyclovir (ZOVIRAX) 260 mg in dextrose 5 % 100 mL IVPB  Status:  Discontinued        260 mg 105.2 mL/hr over 60 Minutes Intravenous Every 24 hours 11/07/21 0927 11/08/21 0735   11/07/21 0900  vancomycin (VANCOCIN) IVPB 1000 mg/200 mL premix        1,000 mg 200 mL/hr over 60 Minutes Intravenous  Once 11/07/21 0812 11/07/21 0956   11/07/21 0900  ceFEPIme (MAXIPIME) 1 g in sodium chloride 0.9 % 100 mL IVPB  Status:  Discontinued        1 g 200 mL/hr over 30 Minutes Intravenous Daily at bedtime 11/07/21 0812 11/08/21 1520   11/06/21 1000  molnupiravir EUA (LAGEVRIO) capsule 800 mg        4 capsule Oral 2 times daily 11/06/21 0332 11/11/21 0959        Medications  Scheduled Meds:  amLODipine  10 mg Oral Daily   vitamin C  500 mg Oral Daily   aspirin EC  81 mg Oral Daily   Chlorhexidine Gluconate Cloth  6 each Topical Daily   cholecalciferol  1,000 Units Oral Daily   cloNIDine  0.1 mg Transdermal Weekly   cycloSPORINE modified  100 mg Oral Q1400   cycloSPORINE modified  200 mg Oral Q breakfast   doxercalciferol  6 mcg Intravenous Q M,W,F-HD   famotidine  20 mg Oral Daily   ferric citrate  630 mg Oral TID WC   labetalol  300 mg Oral TID   lactulose  30 g Oral BID   predniSONE  15 mg Oral Q breakfast   zinc sulfate  220 mg Oral Daily   Continuous Infusions:  sodium chloride     sodium chloride     dextrose 5 % and 0.9% NaCl Stopped (11/09/21 1638)   PRN Meds:.sodium chloride, sodium chloride, acetaminophen, colchicine, diphenhydrAMINE, heparin, heparin, hydrALAZINE, lidocaine (PF), lidocaine-prilocaine, metoCLOPramide, morphine injection, naLOXone (NARCAN)  injection, ondansetron **OR**  ondansetron (ZOFRAN) IV, oxyCODONE, pentafluoroprop-tetrafluoroeth    Subjective:   [name] [name] was seen and examined today.  Pt reports pain all over .  Objective:   Vitals:   11/11/21 1000 11/11/21 1150 11/11/21 1200 11/11/21 1500  BP: 117/71   119/68 132/73  Pulse:      Resp: (!) 25 (!) 22  17  Temp:  98.6 F (37 C)    TempSrc:  Oral    SpO2:      Weight:      Height:        Intake/Output Summary (Last 24 hours) at 11/11/2021 1545 Last data filed at 11/10/2021 1900 Gross per 24 hour  Intake 100 ml  Output 100 ml  Net 0 ml    Filed Weights   11/06/21 0221 11/07/21 0300 11/07/21 0700  Weight: 61.2 kg 56.5 kg 53.4 kg     Exam General exam: ill appearing, not in distress.  Respiratory system: Clear to auscultation. Respiratory effort normal. Cardiovascular system: S1 & S2 heard, RRR. No JVD, No pedal edema. Gastrointestinal system: Abdomen is nondistended, soft and nontender.  Normal bowel sounds heard. Central nervous system: Alert and oriented. Grossly non focal exam.  Extremities: no pedal edema.  Skin: No rashes seen.  Psychiatry:  Mood & affect appropriate.     Data Reviewed:  I have personally reviewed following labs and imaging studies   CBC Lab Results  Component Value Date   WBC 3.5 (L) 11/11/2021   RBC 4.21 11/11/2021   HGB 12.8 11/11/2021   HCT 37.6 11/11/2021   MCV 89.3 11/11/2021   MCH 30.4 11/11/2021   PLT 61 (L) 11/11/2021   MCHC 34.0 11/11/2021   RDW 12.7 11/11/2021   LYMPHSABS 0.8 11/11/2021   MONOABS 0.3 11/11/2021   EOSABS 0.0 11/11/2021   BASOSABS 0.0 60/63/0160     Last metabolic panel Lab Results  Component Value Date   NA 139 11/11/2021   K 3.4 (L) 11/11/2021   CL 101 11/11/2021   CO2 21 (L) 11/11/2021   BUN 68 (H) 11/11/2021   CREATININE 6.74 (H) 11/11/2021   GLUCOSE 108 (H) 11/11/2021   GFRNONAA 7 (L) 11/11/2021   GFRAA 3 (L) 05/14/2020   CALCIUM 8.4 (L) 11/11/2021   PHOS 7.7 (H) 11/11/2021   PROT 7.0 11/09/2021   ALBUMIN 2.9 (L) 11/11/2021   BILITOT 0.5 11/09/2021   ALKPHOS 58 11/09/2021   AST 44 (H) 11/09/2021   ALT 25 11/09/2021   ANIONGAP 17 (H) 11/11/2021    CBG (last 3)  Recent Labs    11/11/21 0340 11/11/21 0846 11/11/21 1140  GLUCAP 104* 103*  125*       Coagulation Profile: No results for input(s): INR, PROTIME in the last 168 hours.   Radiology Studies: CT HEAD WO CONTRAST (5MM)  Result Date: 11/11/2021 CLINICAL DATA:  Follow-up examination for subdural hematoma. EXAM: CT HEAD WITHOUT CONTRAST TECHNIQUE: Contiguous axial images were obtained from the base of the skull through the vertex without intravenous contrast. RADIATION DOSE REDUCTION: This exam was performed according to the departmental dose-optimization program which includes automated exposure control, adjustment of the mA and/or kV according to patient size and/or use of iterative reconstruction technique. COMPARISON:  Prior CT and MRI from 11/09/2021. FINDINGS: Brain: Previously identified extra-axial hemorrhage overlying the anterior left frontal convexity again seen, not significantly changed in size or morphology measuring up to 9 mm in maximal thickness. Slight extension along the adjacent anterior falx, suggesting that this is subdural in nature.  No significant regional mass effect or midline shift. No other new intracranial hemorrhage. Normal expected interval evolution of previously identified cerebellar infarcts. No other new large vessel territory infarct. No hydrocephalus. Vascular: Age advanced atherosclerotic calcifications about the skull base and visualized head. Skull: No scalp soft tissue abnormality. Calvarium intact without abnormality. Sinuses/Orbits: Globes and orbital soft tissues demonstrate no acute finding. Paranasal sinuses and mastoid air cells remain clear. Other: None. IMPRESSION: 1. No significant interval change in size and morphology of extra-axial hemorrhage overlying the anterior left frontal convexity, measuring up to 9 mm in maximal thickness. No significant regional mass effect or midline shift. No new hemorrhage. 2. Normal expected interval evolution of previously identified cerebellar infarcts. 3. No other new acute intracranial abnormality.  Electronically Signed   By: Jeannine Boga M.D.   On: 11/11/2021 02:50   CT HEAD WO CONTRAST (5MM)  Result Date: 11/10/2021 CLINICAL DATA:  Follow-up intracranial hemorrhage EXAM: CT HEAD WITHOUT CONTRAST TECHNIQUE: Contiguous axial images were obtained from the base of the skull through the vertex without intravenous contrast. RADIATION DOSE REDUCTION: This exam was performed according to the departmental dose-optimization program which includes automated exposure control, adjustment of the mA and/or kV according to patient size and/or use of iterative reconstruction technique. COMPARISON:  Brain MRI from earlier today FINDINGS: Brain: The extra-axial anterior left frontal abnormality is confirmed as intracranial hemorrhage in measures up to 8 mm in maximal thickness with mild local mass effect. Agree with the subdural location. Known bilateral para median cerebellar infarcts. No hydrocephalus or shift. Vascular: Premature arterial calcification. Skull: Normal. Negative for fracture or focal lesion. Sinuses/Orbits: No acute finding. IMPRESSION: 1. Confirmed anterior left frontal subdural hemorrhage measuring up to 8 mm in thickness. 2. Known small cerebellar infarcts. Electronically Signed   By: Jorje Guild M.D.   On: 11/10/2021 04:03   MR ANGIO HEAD WO CONTRAST  Result Date: 11/11/2021 CLINICAL DATA:  Follow-up examination for acute stroke. EXAM: MRA HEAD WITHOUT CONTRAST TECHNIQUE: Angiographic images of the Circle of Willis were acquired using MRA technique without intravenous contrast. COMPARISON:  Prior brain MRI from 11/09/2021. FINDINGS: Anterior circulation: Examination mildly degraded by motion. Visualized distal cervical segments of both internal carotid arteries widely patent with antegrade flow. Petrous segments widely patent bilaterally. Atheromatous irregularity within the carotid siphons without hemodynamically significant stenosis. A1 segments widely patent. Normal anterior  communicating artery complex. Mild atheromatous irregularity within the ACAs without significant stenosis. No M1 stenosis or occlusion. Normal right MCA bifurcation. Left MCA trifurcates. No proximal MCA branch occlusion. Distal MCA branches well perfused and symmetric, although demonstrates small vessel atheromatous irregularity. Posterior circulation: Visualized V4 segments widely patent without stenosis. Left vertebral artery slightly dominant. Right PICA patent at its origin. Left PICA origin not visualized. Basilar patent to its distal aspect without stenosis. Superior cerebellar arteries patent bilaterally. Both PCAs primarily supplied via the basilar well perfused or distal aspects. Anatomic variants: None significant. Other: No intracranial aneurysm. IMPRESSION: 1. Negative MRA for large vessel occlusion or other acute finding. 2. Age advanced intracranial small vessel atheromatous disease, but no hemodynamically significant or correctable stenosis. Electronically Signed   By: Jeannine Boga M.D.   On: 11/11/2021 02:58   MR BRAIN WO CONTRAST  Result Date: 11/09/2021 EXAM: MRI HEAD WITHOUT CONTRAST TECHNIQUE: Multiplanar, multiecho pulse sequences of the brain and surrounding structures were obtained without intravenous contrast. COMPARISON:  None. FINDINGS: Motion limited study. Also, incomplete study due to patient intolerance. Axial T1 and coronal T2 sequences were  not performed. Brain: Small foci of restricted diffusion in the posterior paramidline cerebellum, concerning for acute infarcts (series 3, image 15). Additional punctate focus of restricted diffusion in the splenium of the corpus callosum (series 3, image 29). Along the left frontal convexity there is an approximately 1.0 x 2.5 cm lobulated area of T2/FLAIR hyperintensity with associated susceptibility artifact, which may be extra-axial. This area is T1 isointense. No significant mass effect. No midline shift. No hydrocephalus. Small  remote infarcts in the right pons, inferior cerebellum, and right basal ganglia. Vascular: Major arterial flow voids are maintained at the skull base. Skull and upper cervical spine: Normal marrow signal. Sinuses/Orbits: Clear sinuses.  Unremarkable orbits. Other: Small bilateral mastoid effusions. IMPRESSION: Incomplete and motion limited study, as detailed above. 1. Along the left frontal convexity there is a 1.0 x 2.5 cm area of T2/FLAIR hyperintensity and susceptibility artifact, which appears to be extra-axial. This finding is most suspicious for recent hemorrhage, probably subdural in location. No significant mass effect. A repeat CT head could assess for hyperdense blood products in this region. If the CT head does not confirm hemorrhage, then follow-up postcontrast MRI imaging is recommended to exclude a mass (such as a meningioma). 2. Small acute infarcts in the posterior paramidline cerebellum bilaterally. 3. Additional punctate focus of restricted diffusion in the splenium of the corpus callosum, which likely represents a cytotoxic lesion of the corpus callosum ([place]ter). This has a broad differential that includes the sequela of seizures, metabolic disturbance, infection, or sequela of drugs/toxins. 4. Small remote infarcts in the right pons, inferior cerebellum, and right basal ganglia. These results will be called to the ordering clinician or representative by the Radiologist Assistant, and communication documented in the PACS or [employer]. Electronically Signed   By: Margaretha Sheffield M.D.   On: 11/09/2021 18:18   ECHOCARDIOGRAM COMPLETE  Result Date: 11/11/2021    ECHOCARDIOGRAM REPORT   Patient Name:   Naiah KANDICE SCHMELTER Date of Exam: 11/11/2021 Medical Rec #:  [id number]          Height:       64.0 in Accession #:    [id number]         Weight:       117.7 lb Date of Birth:  10/28/1980           BSA:          1.562 m Patient Age:    33 years           BP:           114/60 mmHg Patient Gender:  F                  HR:           90 bpm. Exam Location:  Inpatient Procedure: 2D Echo, Cardiac Doppler and Color Doppler Indications:    Stroke  History:        Patient has no prior history of Echocardiogram examinations.                 Risk Factors:Hypertension and Diabetes.  Sonographer:    Jyl Heinz Referring Phys: [id number] ASHISH ARORA IMPRESSIONS  1. Left ventricular ejection fraction, by estimation, is 55 to 60%. The left ventricle has normal function. The left ventricle has no regional wall motion abnormalities. There is severe concentric left ventricular hypertrophy. Left ventricular diastolic  parameters are consistent with Grade I diastolic dysfunction (impaired relaxation).  2. Right ventricular systolic function is normal. The  right ventricular size is normal. Tricuspid regurgitation signal is inadequate for assessing PA pressure.  3. Moderate pericardial effusion. The pericardial effusion is circumferential. There is no evidence of cardiac tamponade.  4. The mitral valve is abnormal. Trivial mitral valve regurgitation. No evidence of mitral stenosis.  5. The aortic valve is tricuspid. There is mild calcification of the aortic valve. Aortic valve regurgitation is mild. No aortic stenosis is present.  6. The inferior vena cava is normal in size with greater than 50% respiratory variability, suggesting right atrial pressure of 3 mmHg. Comparison(s): No prior Echocardiogram. Hypertrophy, effusion, and mitral valve are abnormal for age but may be consistent with ESRD. FINDINGS  Left Ventricle: Left ventricular ejection fraction, by estimation, is 55 to 60%. The left ventricle has normal function. The left ventricle has no regional wall motion abnormalities. The left ventricular internal cavity size was normal in size. There is  severe concentric left ventricular hypertrophy. Left ventricular diastolic parameters are consistent with Grade I diastolic dysfunction (impaired relaxation). Right Ventricle: The  right ventricular size is normal. No increase in right ventricular wall thickness. Right ventricular systolic function is normal. Tricuspid regurgitation signal is inadequate for assessing PA pressure. Left Atrium: Left atrial size was normal in size. Right Atrium: Right atrial size was normal in size. Pericardium: A moderately sized pericardial effusion is present. The pericardial effusion is circumferential. There is no evidence of cardiac tamponade. Mitral Valve: The mitral valve is abnormal. Mild mitral annular calcification. Trivial mitral valve regurgitation. No evidence of mitral valve stenosis. Tricuspid Valve: The tricuspid valve is normal in structure. Tricuspid valve regurgitation is not demonstrated. No evidence of tricuspid stenosis. Aortic Valve: The aortic valve is tricuspid. There is mild calcification of the aortic valve. Aortic valve regurgitation is mild. No aortic stenosis is present. Aortic valve peak gradient measures 11.3 mmHg. Pulmonic Valve: The pulmonic valve was normal in structure. Pulmonic valve regurgitation is not visualized. No evidence of pulmonic stenosis. Aorta: The aortic root, ascending aorta and aortic arch are all structurally normal, with no evidence of dilitation or obstruction. Venous: The inferior vena cava is normal in size with greater than 50% respiratory variability, suggesting right atrial pressure of 3 mmHg. IAS/Shunts: No atrial level shunt detected by color flow Doppler.  LEFT VENTRICLE PLAX 2D LVIDd:         3.80 cm     Diastology LVIDs:         2.50 cm     LV e' medial:    4.57 cm/s LV PW:         1.30 cm     LV E/e' medial:  13.5 LV IVS:        1.00 cm     LV e' lateral:   5.33 cm/s LVOT diam:     1.80 cm     LV E/e' lateral: 11.6 LV SV:         44 LV SV Index:   28 LVOT Area:     2.54 cm  LV Volumes (MOD) LV vol d, MOD A2C: 70.0 ml LV vol d, MOD A4C: 78.8 ml LV vol s, MOD A2C: 30.5 ml LV vol s, MOD A4C: 34.1 ml LV SV MOD A2C:     39.5 ml LV SV MOD A4C:     78.8  ml LV SV MOD BP:      42.7 ml RIGHT VENTRICLE             IVC RV Basal diam:  2.70  cm     IVC diam: 1.20 cm RV Mid diam:    1.70 cm RV S prime:     10.80 cm/s TAPSE (M-mode): 1.6 cm LEFT ATRIUM             Index        RIGHT ATRIUM           Index LA diam:        2.90 cm 1.86 cm/m   RA Area:     11.00 cm LA Vol (A2C):   27.8 ml 17.80 ml/m  RA Volume:   23.70 ml  15.17 ml/m LA Vol (A4C):   27.5 ml 17.61 ml/m LA Biplane Vol: 28.5 ml 18.25 ml/m  AORTIC VALVE AV Area (Vmax): 1.89 cm AV Vmax:        168.00 cm/s AV Peak Grad:   11.3 mmHg LVOT Vmax:      125.00 cm/s LVOT Vmean:     85.300 cm/s LVOT VTI:       0.172 m  AORTA Ao Root diam: 2.50 cm Ao Asc diam:  2.70 cm MITRAL VALVE MV Area (PHT): 3.61 cm    SHUNTS MV Decel Time: 210 msec    Systemic VTI:  0.17 m MV E velocity: 61.80 cm/s  Systemic Diam: 1.80 cm MV A velocity: 76.00 cm/s MV E/A ratio:  0.81 Rudean Haskell MD Electronically signed by Rudean Haskell MD Signature Date/Time: 11/11/2021/11:13:28 AM    Final        Hosie Poisson M.D. [name] 11/11/2021, 3:45 PM  Available via Epic secure chat 7am-7pm After 7 pm, please refer to night coverage provider listed on amion.

## 2021-11-11 NOTE — Progress Notes (Signed)
Chart review note  Per RN, no significant change.  Remains awake alert with some drowsiness after sedating medications given this morning.  Medications  Current Facility-Administered Medications:    0.9 %  sodium chloride infusion, 100 mL, Intravenous, PRN, Sanford, Ryan B, MD   0.9 %  sodium chloride infusion, 100 mL, Intravenous, PRN, Pearson Grippe B, MD   acetaminophen (TYLENOL) tablet 650 mg, 650 mg, Oral, Q6H PRN, Hosie Poisson, MD, 650 mg at 11/10/21 1848   amLODipine (NORVASC) tablet 10 mg, 10 mg, Oral, Daily, Karleen Hampshire, Vijaya, MD, 10 mg at 11/10/21 1047   ascorbic acid (VITAMIN C) tablet 500 mg, 500 mg, Oral, Daily, Hosie Poisson, MD   aspirin EC tablet 81 mg, 81 mg, Oral, Daily, Karleen Hampshire, Vijaya, MD, 81 mg at 11/10/21 1046   cefTRIAXone (ROCEPHIN) 2 g in sodium chloride 0.9 % 100 mL IVPB, 2 g, Intravenous, Q24H, Hosie Poisson, MD, Stopped at 11/10/21 1612   Chlorhexidine Gluconate Cloth 2 % PADS 6 each, 6 each, Topical, Daily, Mansy, Jan A, MD, 6 each at 11/10/21 1400   cholecalciferol (VITAMIN D3) tablet 1,000 Units, 1,000 Units, Oral, Daily, Mansy, Jan A, MD, 1,000 Units at 11/10/21 1047   cloNIDine (CATAPRES) tablet 0.1 mg, 0.1 mg, Oral, BID, Karleen Hampshire, Vijaya, MD, 0.1 mg at 11/10/21 2309   colchicine tablet 0.6 mg, 0.6 mg, Oral, TID PRN, Hosie Poisson, MD, 0.6 mg at 11/10/21 1047   cycloSPORINE modified (NEORAL) capsule 100 mg, 100 mg, Oral, Q1400, Roney Jaffe, MD, 100 mg at 11/10/21 2236   cycloSPORINE modified (NEORAL) capsule 200 mg, 200 mg, Oral, Q breakfast, Roney Jaffe, MD   dextrose 5 %-0.9 % sodium chloride infusion, , Intravenous, Continuous, Eugenie Filler, MD, Stopped at 11/09/21 1638   diphenhydrAMINE (BENADRYL) 12.5 MG/5ML elixir 25 mg, 25 mg, Oral, Q6H PRN, Hosie Poisson, MD   doxercalciferol (HECTOROL) injection 6 mcg, 6 mcg, Intravenous, Q M,W,F-HD, Roney Jaffe, MD   famotidine (PEPCID) tablet 20 mg, 20 mg, Oral, Daily, Gaylan Gerold, DO, 20 mg at 11/10/21  1046   ferric citrate (AURYXIA) tablet 630 mg, 630 mg, Oral, TID WC, Clark, Laura P, DO, 630 mg at 11/10/21 1626   heparin injection 1,000 Units, 1,000 Units, Dialysis, PRN, Pearson Grippe B, MD   heparin injection 1,200 Units, 20 Units/kg, Dialysis, PRN, Pearson Grippe B, MD   hydrALAZINE (APRESOLINE) injection 10 mg, 10 mg, Intravenous, Q6H PRN, Eugenie Filler, MD, 10 mg at 11/10/21 1148   labetalol (NORMODYNE) tablet 300 mg, 300 mg, Oral, TID, Hosie Poisson, MD, 300 mg at 11/10/21 2309   lactulose (CHRONULAC) 10 GM/15ML solution 30 g, 30 g, Oral, BID, Karleen Hampshire, Vijaya, MD, 30 g at 11/10/21 2235   lidocaine (PF) (XYLOCAINE) 1 % injection 5 mL, 5 mL, Intradermal, PRN, Pearson Grippe B, MD   lidocaine-prilocaine (EMLA) cream 1 application, 1 application., Topical, PRN, Rexene Agent, MD   metoCLOPramide (REGLAN) tablet 5 mg, 5 mg, Oral, Q6H PRN, Hosie Poisson, MD, 5 mg at 11/10/21 2256   molnupiravir EUA (LAGEVRIO) capsule 800 mg, 4 capsule, Oral, BID, Mansy, Jan A, MD, 800 mg at 11/10/21 2236   naloxone [facility]) injection 0.4 mg, 0.4 mg, Intravenous, PRN, Kommor, Madison, MD, 0.4 mg at 11/06/21 1011   ondansetron (ZOFRAN) tablet 4 mg, 4 mg, Oral, Q6H PRN **OR** ondansetron (ZOFRAN) injection 4 mg, 4 mg, Intravenous, Q6H PRN, Hosie Poisson, MD, 4 mg at 11/10/21 2233   pentafluoroprop-tetrafluoroeth (GEBAUERS) aerosol 1 application, 1 application., Topical, PRN, Rexene Agent, MD  predniSONE (DELTASONE) tablet 15 mg, 15 mg, Oral, Q breakfast, Gaylan Gerold, DO, 15 mg at 11/10/21 2353   zinc sulfate capsule 220 mg, 220 mg, Oral, Daily, Hosie Poisson, MD Labs CBC    Component Value Date/Time   WBC 3.5 (L) 11/11/2021 0225   RBC 4.21 11/11/2021 0225   HGB 12.8 11/11/2021 0225   HCT 37.6 11/11/2021 0225   PLT 61 (L) 11/11/2021 0225   MCV 89.3 11/11/2021 0225   MCH 30.4 11/11/2021 0225   MCHC 34.0 11/11/2021 0225   RDW 12.7 11/11/2021 0225   LYMPHSABS 0.8 11/11/2021 0225   MONOABS 0.3  11/11/2021 0225   EOSABS 0.0 11/11/2021 0225   BASOSABS 0.0 11/11/2021 0225    CMP     Component Value Date/Time   NA 139 11/11/2021 0225   K 3.4 (L) 11/11/2021 0225   CL 101 11/11/2021 0225   CO2 21 (L) 11/11/2021 0225   GLUCOSE 108 (H) 11/11/2021 0225   BUN 68 (H) 11/11/2021 0225   CREATININE 6.74 (H) 11/11/2021 0225   CALCIUM 8.4 (L) 11/11/2021 0225   PROT 7.0 11/09/2021 0109   ALBUMIN 2.9 (L) 11/11/2021 0225   AST 44 (H) 11/09/2021 0109   ALT 25 11/09/2021 0109   ALKPHOS 58 11/09/2021 0109   BILITOT 0.5 11/09/2021 0109   GFRNONAA 7 (L) 11/11/2021 0225   GFRAA 3 (L) 05/14/2020 0223   CSF Glu 81 Prt 31 RBC 91 & 2 WBC  1 & 1   Imaging I have reviewed images in epic and the results pertinent to this consultation are: MRI brain IMPRESSION: Incomplete and motion limited study, as detailed above.   1. Along the left frontal convexity there is a 1.0 x 2.5 cm area of T2/FLAIR hyperintensity and susceptibility artifact, which appears to be extra-axial. This finding is most suspicious for recent hemorrhage, probably subdural in location. No significant mass effect. A repeat CT head could assess for hyperdense blood products in this region. If the CT head does not confirm hemorrhage, then follow-up postcontrast MRI imaging is recommended to exclude a mass (such as a meningioma). 2. Small acute infarcts in the posterior paramidline cerebellum bilaterally. 3. Additional punctate focus of restricted diffusion in the splenium of the corpus callosum, which likely represents a cytotoxic lesion of the corpus callosum ([place]). This has a broad differential that includes the sequela of seizures, metabolic disturbance, infection, or sequela of drugs/toxins. 4. Small remote infarcts in the right pons, inferior cerebellum, and right basal ganglia.  Repeat CTH: confirms the left SDH.  MRA head IMPRESSION: 1. Negative MRA for large vessel occlusion or other acute finding. 2.  Age advanced intracranial small vessel atheromatous disease, but no hemodynamically significant or correctable stenosis.  Carotid dopplers pending-please recall if there is any concerning finding  2d Echo IMPRESSIONS     1. Left ventricular ejection fraction, by estimation, is 55 to 60%. The  left ventricle has normal function. The left ventricle has no regional  wall motion abnormalities. There is severe concentric left ventricular  hypertrophy. Left ventricular diastolic   parameters are consistent with Grade I diastolic dysfunction (impaired  relaxation).   2. Right ventricular systolic function is normal. The right ventricular  size is normal. Tricuspid regurgitation signal is inadequate for assessing  PA pressure.   3. Moderate pericardial effusion. The pericardial effusion is  circumferential. There is no evidence of cardiac tamponade.   4. The mitral valve is abnormal. Trivial mitral valve regurgitation. No  evidence of mitral stenosis.  5. The aortic valve is tricuspid. There is mild calcification of the  aortic valve. Aortic valve regurgitation is mild. No aortic stenosis is  present.   6. The inferior vena cava is normal in size with greater than 50%  respiratory variability, suggesting right atrial pressure of 3 mmHg.  Assessment:  40 year old who presented to the hospital after feeling unwell for few days-has a history of ESRD, pancreatic and renal transplant that is fairly likely secondary to noncompliance, extremely encephalopathic.  Her renal function was somewhat improving, initially came in with hyperammonemia which was improving-thought to have taken more than prescribed amount of opiates and Narcan was given with some improved response but then again became encephalopathic. Also COVID-positive with pulmonary edema. Neurology consultation for altered mental status  Initial thought process is that this is all related to her toxic metabolic encephalopathy from COVID-19  infection as well as ESRD but since she showed no improvement, she underwent a spinal tap results of which are bland and not convincing for infection.  EEG overnight was done for concern for seizures-some concern that the generalized periodic discharges could be something real rather than metabolic-Ativan challenge given which improved those findings.  Other etiology could be cefepime toxicity. No antiepileptics used and mentation is improving-likely EEG findings are secondary to toxic metabolic encephalopathy.  MRI completed for any structural cause of encephalopathy and reported multiple things: - Small left frontal subdural hematoma-no surgical intervention needed. Keep platelets above 50K. - Small acute infarcts in the posterior paramedian cerebellum in the midline bilaterally - work up essentially complete. No clear cause-question if related to hypercoagulability from COVID or concomitant small vessel disease or underlying multiple comorbidities. 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  Will be following with neurosurgery.  Can resume outpatient when okay from a neurosurgical perspective.  May need hematological evaluation for thrombocytopenia as well -Stroke work-up with no clear cause of infarctions-could be concomitant small vessel disease versus hypercoagulability from COVID.  We will send out hypercoagulable panel-again would be helpful to follow-up with hematology outpatient. -Frequent neurochecks, PT OT speech therapy -Continue medical management of toxic metabolic derangements per  primary team as you are -Artery Dopplers ordered and pending-primary team to follow-please call with any significant derangements in that test result.  Discussed with Dr. Karleen Hampshire -- Amie Portland, MD Neurologist [name] Pager: [phone]

## 2021-11-11 NOTE — Progress Notes (Signed)
[facility] ?Progress Note ? ?Subjective: seen in room, alert and Ox 3, knows transplant date today ? ?Vitals:  ? 11/11/21 0900 11/11/21 1000 11/11/21 1150 11/11/21 1200  ?BP:  117/71  119/68  ?Pulse: 86     ?Resp: 19 (!) 25 (!) 22   ?Temp:   98.6 ?F (37 ?C)   ?TempSrc:   Oral   ?SpO2: 94%     ?Weight:      ?Height:      ? ? ?Exam: ? Awake, Ox 3 ? no jvd ? Chest cta bilat ? Cor reg no RG ? Abd soft ntnd no ascites ?  Ext no UE/ LE edema ?  Neuro as above, nonfocal, no asterixis ?  RUE AVF+bruit ? ? ? OP HD: AF MWF ? 3h 37mn  400/500  59kg  2/2 bath  RUE AVF  15g  Hep 2000 ? - hectorol 6 ug tiw IV ? - no esa ? ?    BUN 42, Creat 10 > 6.3  this am.  NH3 98 >> 42 today ?   3/06 CXR - IMPRESSION: ?1. Interval placement of NG tube extending into the stomach. ?2. Unchanged diffuse bilateral interstitial opacities, edema versus ?multifocal infection. ? ? ? ?Assessment/ Plan: ?AMS - no seizures by EEG. Metabolic due to [facility] uremia/ esrd, possible acute SDH. NSurg evaluating. MS better w/ lactulose and HD. Asterixis resolved.  ?COVID pna - resolved, getting IV Rocephin and molnupiravir ?ESRD - usual HD MWF. HD today / tonight.  ?HTN - BP's were low, now high. BP meds resumed. 6kg under dry wt. Euvolemic.  ?DM2 ?H/o failed pancreas/ renal transplant - pancreas still viable on CyA, pred and myfortic. CyA and myfortic held initially, now restarted at about 3/4 strength w/ '200mg'$  am and '100mg'$  pm. Resume myfortic when done w/ IV abx. Getting prednisone at 15 qd. Pt not seeing anybody for her pancreas IS. We will f/u on CyA levels post dc at the OP HD unit.  ?MBD ckd - binders resumed. Cont IV vdra ?Anemia ckd - Hb > 10, no esa needs ? ? ? ? ?[name] [name] ?11/11/2021, 2:08 PM ? ? ?Recent Labs  ?Lab 11/10/21 ?[id number]/10/23 ?0225  ?K 5.1 3.4*  ?BUN 137* 68*  ?CREATININE 12.26* 6.74*  ?ALBUMIN 2.9* 2.9*  ?CALCIUM 7.7* 8.4*  ?PHOS 8.9* 7.7*  ?HGB 12.8 12.8  ? ? ?Inpatient medications: ? amLODipine  10 mg Oral Daily  ?  vitamin C  500 mg Oral Daily  ? aspirin EC  81 mg Oral Daily  ? Chlorhexidine Gluconate Cloth  6 each Topical Daily  ? cholecalciferol  1,000 Units Oral Daily  ? cloNIDine  0.1 mg Transdermal Weekly  ? cycloSPORINE modified  100 mg Oral Q1400  ? cycloSPORINE modified  200 mg Oral Q breakfast  ? doxercalciferol  6 mcg Intravenous Q M,W,F-HD  ? famotidine  20 mg Oral Daily  ? ferric citrate  630 mg Oral TID WC  ? labetalol  300 mg Oral TID  ? lactulose  30 g Oral BID  ? predniSONE  15 mg Oral Q breakfast  ? zinc sulfate  220 mg Oral Daily  ? ? sodium chloride    ? sodium chloride    ? cefTRIAXone (ROCEPHIN)  IV Stopped (11/10/21 1612)  ? dextrose 5 % and 0.9% NaCl Stopped (11/09/21 1638)  ? ?sodium chloride, sodium chloride, acetaminophen, colchicine, diphenhydrAMINE, heparin, heparin, hydrALAZINE, lidocaine (PF), lidocaine-prilocaine, metoCLOPramide, morphine injection, naLOXone (NARCAN)  injection, ondansetron **  OR** ondansetron (ZOFRAN) IV, oxyCODONE, pentafluoroprop-tetrafluoroeth ? ? ? ? ? ? ?

## 2021-11-11 NOTE — Plan of Care (Signed)
?  Problem: Education: ?Goal: Knowledge of General Education information will improve ?Description: Including pain rating scale, medication(s)/side effects and non-pharmacologic comfort measures ?Outcome: Progressing ?  ?Problem: Health Behavior/Discharge Planning: ?Goal: Ability to manage health-related needs will improve ?Outcome: Progressing ?  ?Problem: Clinical Measurements: ?Goal: Ability to maintain clinical measurements within normal limits will improve ?Outcome: Progressing ?Goal: Will remain free from infection ?Outcome: Progressing ?Goal: Diagnostic test results will improve ?Outcome: Progressing ?Goal: Respiratory complications will improve ?Outcome: Progressing ?Goal: Cardiovascular complication will be avoided ?Outcome: Progressing ?  ?Problem: Activity: ?Goal: Risk for activity intolerance will decrease ?Outcome: Progressing ?  ?Problem: Nutrition: ?Goal: Adequate nutrition will be maintained ?Outcome: Progressing ?  ?Problem: Coping: ?Goal: Level of anxiety will decrease ?Outcome: Progressing ?  ?Problem: Elimination: ?Goal: Will not experience complications related to bowel motility ?Outcome: Progressing ?Goal: Will not experience complications related to urinary retention ?Outcome: Progressing ?  ?Problem: Safety: ?Goal: Ability to remain free from injury will improve ?Outcome: Progressing ?  ?Problem: Skin Integrity: ?Goal: Risk for impaired skin integrity will decrease ?Outcome: Progressing ?  ?Problem: Education: ?Goal: Knowledge of disease or condition will improve ?Outcome: Progressing ?Goal: Knowledge of secondary prevention will improve (SELECT ALL) ?Outcome: Progressing ?Goal: Knowledge of patient specific risk factors will improve (INDIVIDUALIZE FOR PATIENT) ?Outcome: Progressing ?Goal: Individualized Educational Video(s) ?Outcome: Progressing ?  ?Problem: Coping: ?Goal: Will verbalize positive feelings about self ?Outcome: Progressing ?Goal: Will identify appropriate support  needs ?Outcome: Progressing ?  ?Problem: Health Behavior/Discharge Planning: ?Goal: Ability to manage health-related needs will improve ?Outcome: Progressing ?  ?Problem: Self-Care: ?Goal: Ability to participate in self-care as condition permits will improve ?Outcome: Progressing ?Goal: Verbalization of feelings and concerns over difficulty with self-care will improve ?Outcome: Progressing ?Goal: Ability to communicate needs accurately will improve ?Outcome: Progressing ?  ?Problem: Nutrition: ?Goal: Risk of aspiration will decrease ?Outcome: Progressing ?  ?Problem: Ischemic Stroke/TIA Tissue Perfusion: ?Goal: Complications of ischemic stroke/TIA will be minimized ?Outcome: Progressing ?  ?Problem: Pain Managment: ?Goal: General experience of comfort will improve ?Outcome: Not Progressing ?  ?

## 2021-11-11 NOTE — Progress Notes (Signed)
Repeat CTH confirms stability of subdural hematoma, no surgical intervention planned. Unfortunately not a lot of good literature regarding magnitude and duration of PLT goals in this scenario, but the best recommendation I have is one week of PLT > 50 if feasible or while the patient is inpatient. Then have her follow up with me in 2 weeks and we'll repeat the CT head at that time.  ?

## 2021-11-11 NOTE — TOC Initial Note (Signed)
Transition of Care (TOC) - Initial/Assessment Note  ? ? ?Patient Details  ?Name: [name] [name] ?MRN: [id number] ?Date of Birth: [birth year] ? ?Transition of Care (TOC) CM/SW Contact:    ?Tom-Johnson, Renea Ee, RN ?Phone Number: ?11/11/2021, 6:12 PM ? ?Clinical Narrative:                 ? ?Patient is admitted for Acute Encephalopathy. Found to be Covid positive. Home health PT recommendation noted. CM spoke with patient via phone and she chose [facility]. Referral sent to [facility] with acceptance voiced. Rolling walker ordered from [employer]. Freda Munro to deliver at bedside.CM will continue to follow with needs.  ? ? ?Expected Discharge Plan: [place] ?Barriers to Discharge: Continued Medical Work up ? ? ?Patient Goals and CMS Choice ?Patient states their goals for this hospitalization and ongoing recovery are:: To return home ?CMS Medicare.gov Compare Post Acute Care list provided to:: Patient ?  ? ?Expected Discharge Plan and Services ?Expected Discharge Plan: [place] ?  ?Discharge Planning Services: CM Consult ?Post Acute Care Choice: Home Health ?Living arrangements for the past 2 months: [place] ?                ?DME Arranged: Walker rolling ?DME Agency: [employer] ?Date DME Agency Contacted: 11/11/21 ?Time DME Agency Contacted: [phone] ?Representative spoke with at DME Agency: Freda Munro ?HH Arranged: PT, OT, RN ?[place] Agency: [facility] ?Date HH Agency Contacted: 11/11/21 ?Time [place]: 3614 ?Representative spoke with at [place]: Delsa Sale ? ?Prior Living Arrangements/Services ?Living arrangements for the past 2 months: [place] ?Lives with:: Self ?Patient language and need for interpreter reviewed:: Yes ?       ?Need for Family Participation in Patient Care: Yes (Comment) ?Care giver support system in place?: Yes (comment) ?  ?Criminal Activity/Legal Involvement Pertinent to Current Situation/Hospitalization: No - Comment as needed ? ?Activities of  Daily Living ?  ?  ? ?Permission Sought/Granted ?Permission sought to share information with : Case Manager, Customer service manager, Family Supports ?Permission granted to share information with : Yes, Verbal Permission Granted ?   ?   ?   ?   ? ?Emotional Assessment ?Appearance:: Appears stated age ?Attitude/Demeanor/Rapport: Other (comment) ?Affect (typically observed): Other (comment) ?Orientation: : Oriented to Self, Oriented to Place ?Alcohol / Substance Use: Other (comment) (Narcotic abuse) ?Psych Involvement: No (comment) ? ?Admission diagnosis:  Uremia [N19] ?Acute encephalopathy [G93.40] ?Patient Active Problem List  ? Diagnosis Date Noted  ? Fever 11/07/2021  ? Acute encephalopathy 11/06/2021  ? Pulmonary edema 11/06/2021  ? Hypertensive urgency 11/06/2021  ? Type 2 diabetes mellitus with peripheral neuropathy ([place]) 11/06/2021  ? History of renal transplant 11/06/2021  ? COVID-19 virus infection 11/06/2021  ? HTN (hypertension), malignant 08/02/2021  ? ESRD (end stage renal disease) ([place]) 02/25/2021  ? Coagulation defect, unspecified ([place]) 12/22/2020  ? Acute cough 10/04/2020  ? Allergy, unspecified, initial encounter 05/21/2020  ? Anaphylactic shock, unspecified, initial encounter 05/21/2020  ? Symptomatic anemia 02/27/2020  ? Shortness of breath 04/23/2019  ? Hypocalcemia 12/30/2018  ? Aortic atherosclerosis ([place]) 12/27/2018  ? Acute respiratory failure with hypoxia ([place]) 12/22/2018  ? Hyperkalemia   ? Bilateral pneumonia 12/21/2018  ? Pancreas transplant status ([place]) 12/21/2018  ? Immunosuppression ([place]) 12/21/2018  ? Chest pain 12/21/2018  ? CAP (community acquired pneumonia) 12/20/2018  ? Other staphylococcus as the cause of diseases classified elsewhere 08/16/2018  ? Mild protein-calorie malnutrition ([place]) 04/18/2018  ?  Encounter for screening for respiratory tuberculosis 04/06/2018  ? Gout 04/06/2018  ? Iron deficiency anemia, unspecified 04/06/2018  ? Leiomyoma of uterus, unspecified  04/06/2018  ? Secondary hyperparathyroidism of renal origin ([place]) 04/06/2018  ? Acute rejection of kidney transplant 07/11/2017  ? Elevated serum creatinine 06/27/2017  ? E. coli UTI 12/18/2014  ? Transplant rejection 09/03/2014  ? Cyst of ovary, left 09/01/2014  ? Immunosuppressive management encounter following kidney transplant 08/26/2014  ? Acne 04/28/2014  ? Itch of skin 04/28/2014  ? Diabetic gastroparesis associated with type 1 diabetes mellitus ([place]) 11/07/2013  ? Nausea & vomiting 11/07/2013  ? Lesion of spleen 10/07/2013  ? Metabolic acidosis 33/82/5053  ? Patient's noncompliance with other medical treatment and regimen 09/12/2013  ? Narcotic dependence ([place]) 08/31/2013  ? Generalized pain 08/30/2013  ? Hypomagnesemia 08/30/2013  ? Renal hematoma of right transplanted kidney 08/21/2013  ? Hypophosphatemia 07/28/2013  ? End stage renal disease ([place]) 02/06/2012  ? Pleural effusion, bilateral 11/16/2011  ? Anemia in chronic kidney disease (CKD) 11/16/2011  ? Pericardial effusion 11/15/2011  ? Fluid overload 11/15/2011  ? Abdominal pain, epigastric 11/15/2011  ? CKD (chronic kidney disease) stage 5, GFR less than 15 ml/min (HCC) 11/03/2011  ? Hypertension 10/27/2011  ? Renal failure, chronic 10/27/2011  ? MEDIAL EPICONDYLITIS, LEFT 12/17/2007  ? ABSCESS, AXILLA, LEFT 12/03/2007  ? Type 2 diabetes mellitus with other diabetic kidney complication ([place]) 97/67/3419  ? ?PCP:  Patient, No Pcp Per (Inactive) ?Pharmacy:   ?[facility] - [place] - [address] ?[address] ?[address], [address] ?[place] [address] ?Phone: [phone] Fax: [phone] ? ?[id number] [id number]?Phone: [phone]: [phone]? ? ? ? ?Social Determinants of Health (SDOH) Interventions ?  ? ?Readmission Risk Interventions ?No flowsheet data found. ? ? ?

## 2021-11-12 ENCOUNTER — Inpatient Hospital Stay (HOSPITAL_COMMUNITY): Payer: Medicare Other

## 2021-11-12 ENCOUNTER — Encounter (HOSPITAL_COMMUNITY): Payer: Medicare Other

## 2021-11-12 LAB — RENAL FUNCTION PANEL
Albumin: 2.8 g/dL — ABNORMAL LOW (ref 3.5–5.0)
Anion gap: 20 — ABNORMAL HIGH (ref 5–15)
BUN: 94 mg/dL — ABNORMAL HIGH (ref 6–20)
CO2: 20 mmol/L — ABNORMAL LOW (ref 22–32)
Calcium: 8.7 mg/dL — ABNORMAL LOW (ref 8.9–10.3)
Chloride: 97 mmol/L — ABNORMAL LOW (ref 98–111)
Creatinine, Ser: 8.68 mg/dL — ABNORMAL HIGH (ref 0.44–1.00)
GFR, Estimated: 5 mL/min — ABNORMAL LOW (ref >60)
Glucose, Bld: 100 mg/dL — ABNORMAL HIGH (ref 70–99)
Phosphorus: 5.3 mg/dL — ABNORMAL HIGH (ref 2.5–4.6)
Potassium: 3.7 mmol/L (ref 3.5–5.1)
Sodium: 137 mmol/L (ref 135–145)

## 2021-11-12 LAB — CULTURE, BLOOD (ROUTINE X 2)
Culture: NO GROWTH
Culture: NO GROWTH
Special Requests: ADEQUATE

## 2021-11-12 LAB — CBC WITH DIFFERENTIAL/PLATELET
Abs Immature Granulocytes: 0.04 10*3/uL (ref 0.00–0.07)
Basophils Absolute: 0 10*3/uL (ref 0.0–0.1)
Basophils Relative: 0 %
Eosinophils Absolute: 0 10*3/uL (ref 0.0–0.5)
Eosinophils Relative: 0 %
HCT: 33.3 % — ABNORMAL LOW (ref 36.0–46.0)
Hemoglobin: 11.3 g/dL — ABNORMAL LOW (ref 12.0–15.0)
Immature Granulocytes: 1 %
Lymphocytes Relative: 23 %
Lymphs Abs: 0.8 10*3/uL (ref 0.7–4.0)
MCH: 29.7 pg (ref 26.0–34.0)
MCHC: 33.9 g/dL (ref 30.0–36.0)
MCV: 87.6 fL (ref 80.0–100.0)
Monocytes Absolute: 0.2 10*3/uL (ref 0.1–1.0)
Monocytes Relative: 6 %
Neutro Abs: 2.4 10*3/uL (ref 1.7–7.7)
Neutrophils Relative %: 70 %
Platelets: 67 10*3/uL — ABNORMAL LOW (ref 150–400)
RBC: 3.8 MIL/uL — ABNORMAL LOW (ref 3.87–5.11)
RDW: 12.4 % (ref 11.5–15.5)
WBC: 3.4 10*3/uL — ABNORMAL LOW (ref 4.0–10.5)
nRBC: 0 % (ref 0.0–0.2)

## 2021-11-12 LAB — GLUCOSE, CAPILLARY: Glucose-Capillary: 94 mg/dL (ref 70–99)

## 2021-11-12 IMAGING — CT CT HEAD W/O CM
4 series · 16 of 47 positions shown, 18 images · non-contrast
Comparison: [DATE]

CLINICAL DATA: New onset of dizziness, altered mental status,
encephalopathy, single episode of vomiting, history stroke,
hypertension, end-stage renal disease



[Series 503: head wo · axial · 0.39mm/px · z∈[+956,+1076]mm · 7 of 32 slices shown, 9 images]
[im 4/32  brain]
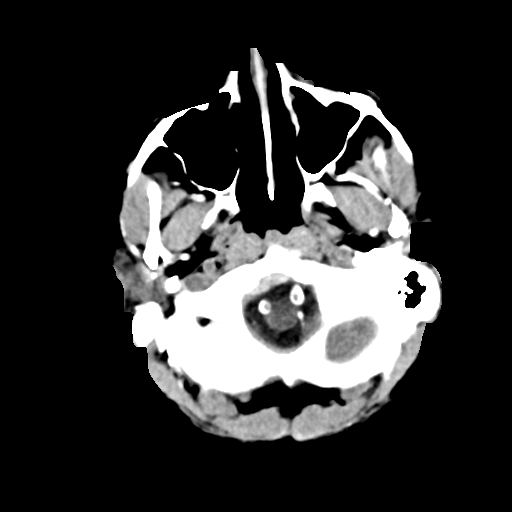
[im 4/32  bone]
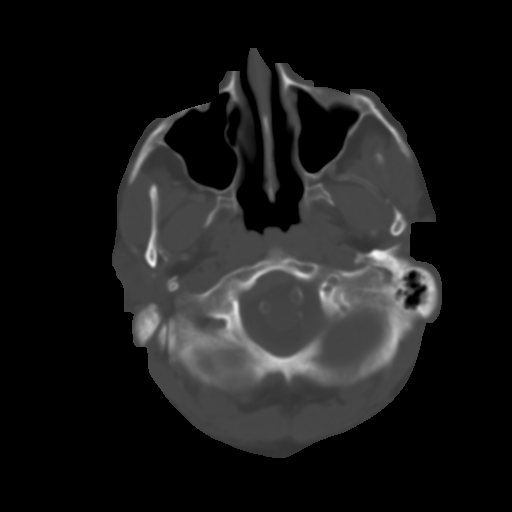
[im 8/32  brain]
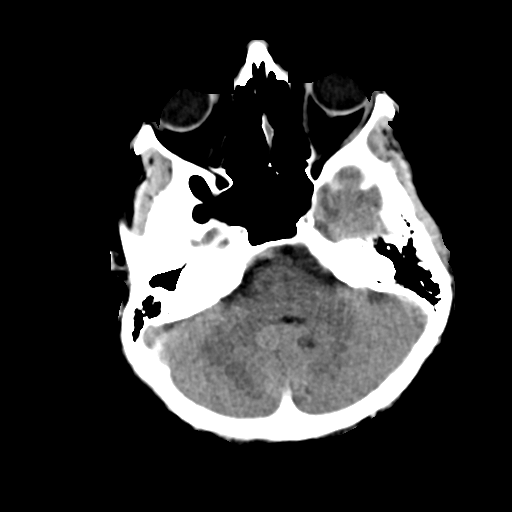
[im 12/32  brain]
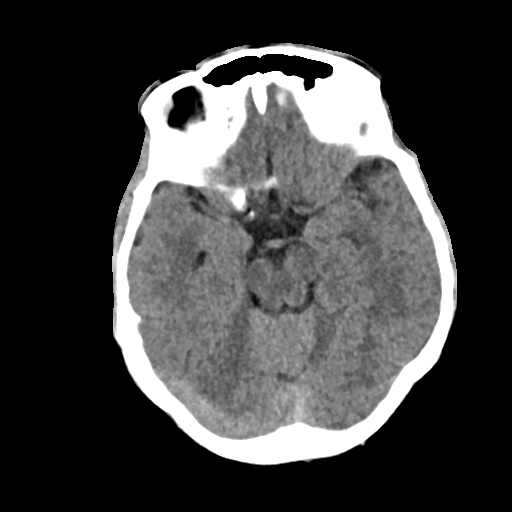
[im 16/32  brain]
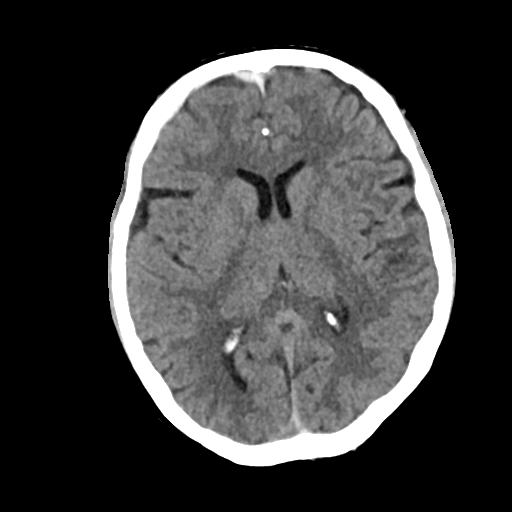
[im 20/32  brain]
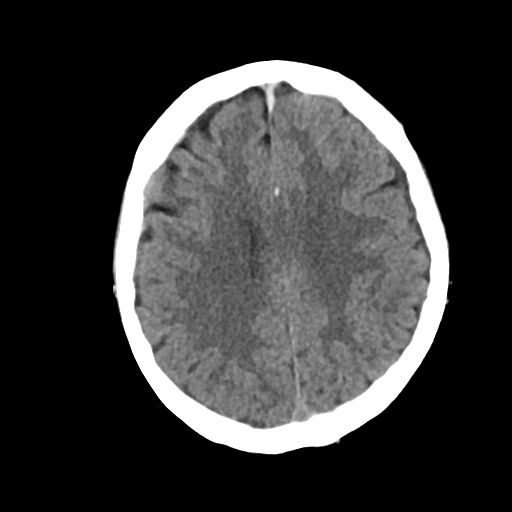
[im 20/32  bone]
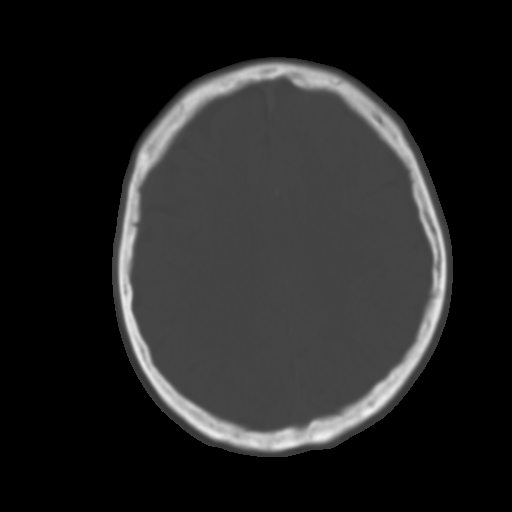
[im 24/32  brain]
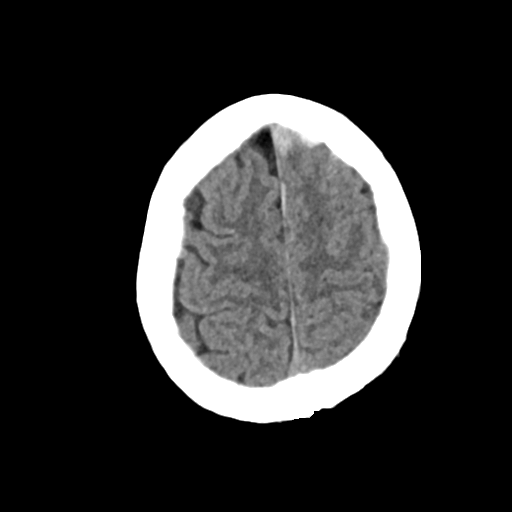
[im 28/32  brain]
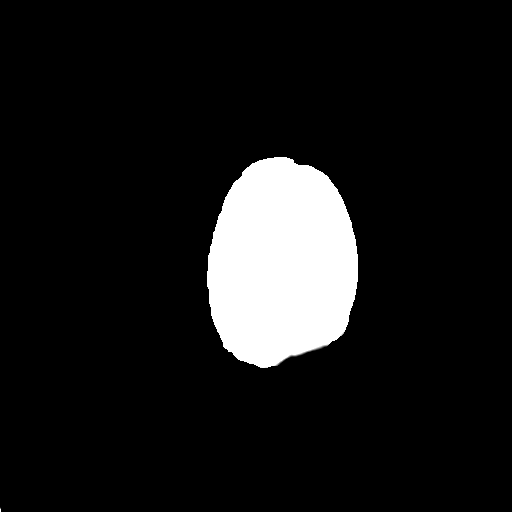

[Series 504: head bone · axial · 0.39mm/px · z∈[+955,+987]mm · 3 of 78 slices shown]
[im 8/78  bone]
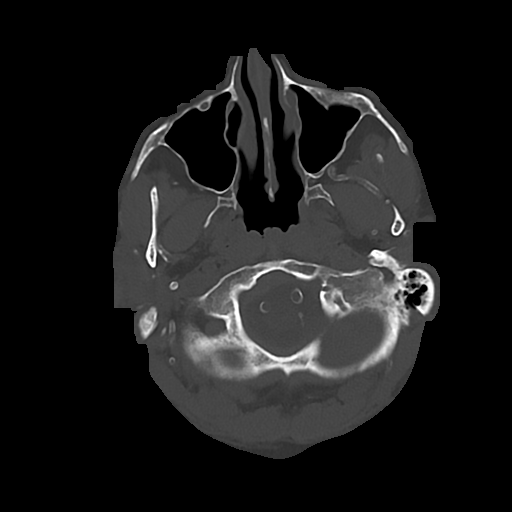
[im 16/78  bone]
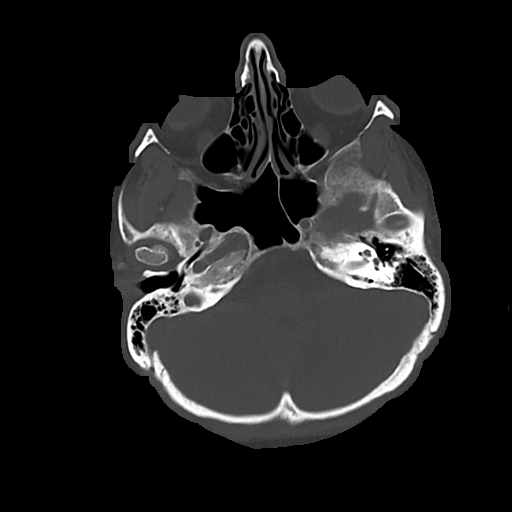
[im 24/78  bone]
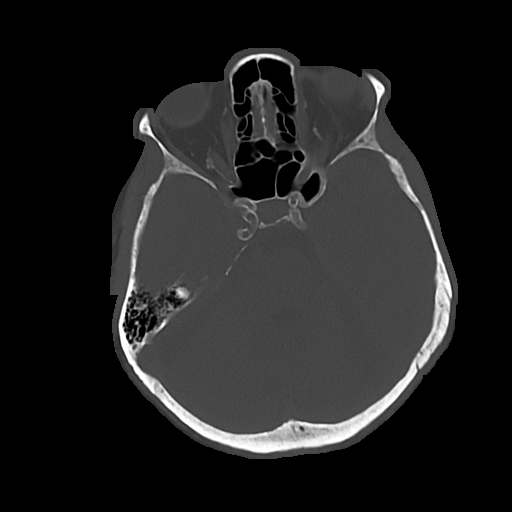

[Series 505: cor soft · coronal · 0.29mm/px · 3 of 64 slices shown]
[im 22/64  brain]
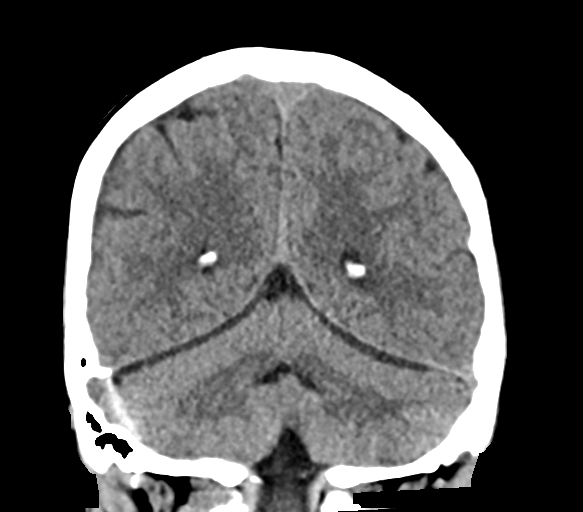
[im 29/64  brain]
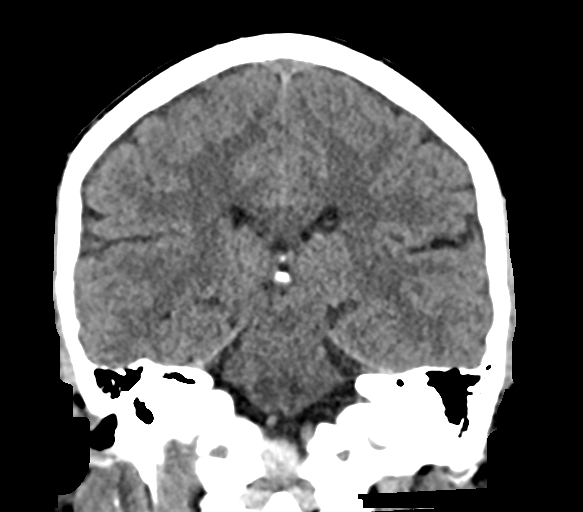
[im 36/64  brain]
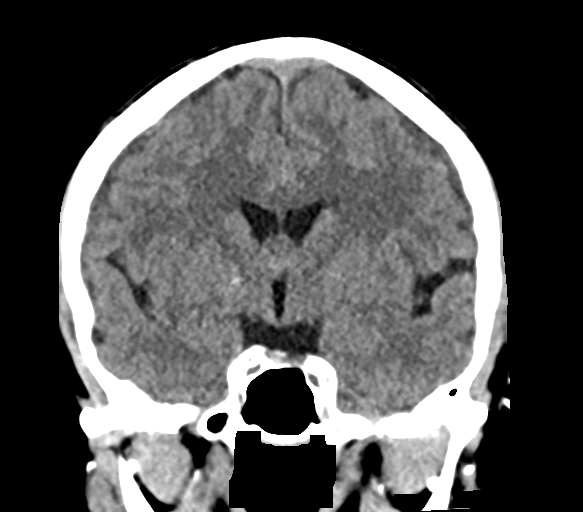

[Series 506: sag soft · sagittal · 0.29mm/px · 3 of 58 slices shown]
[im 20/58  brain]
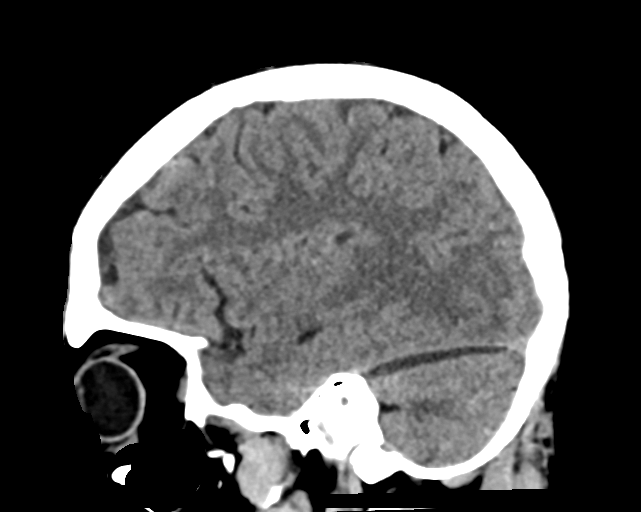
[im 29/58  brain]
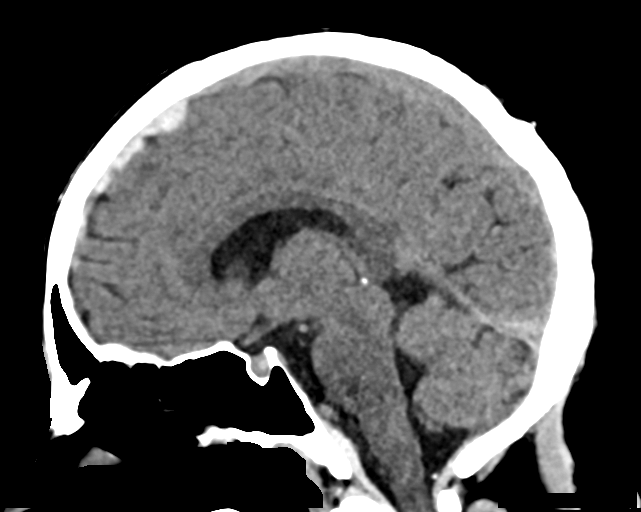
[im 39/58  brain]
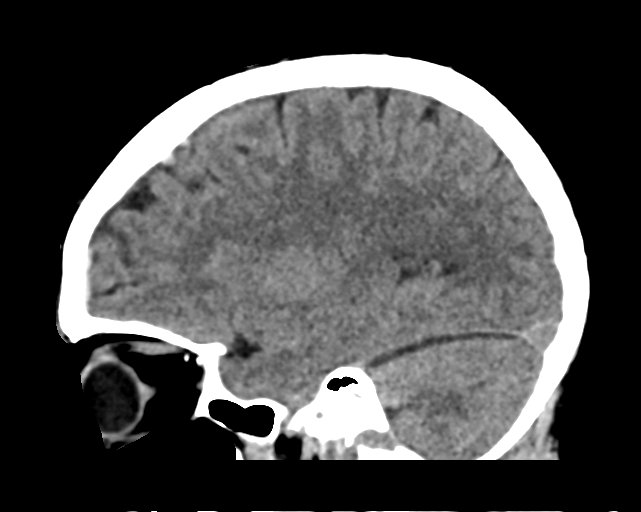

[16 of 47 positions shown; findings below may reference images not displayed]

FINDINGS: Brain: Generalized atrophy. Normal ventricular morphology. No
midline shift or mass effect. Small BILATERAL medial cerebellar
infarcts. Small frontal subdural hematomas are seen bilaterally,
greater on LEFT at 9 mm thick stable but now crossing the midline to
RIGHT 5 mm thick and slightly increased along anterior falx since
previous exam. No midline shift or mass effect. No new areas of
infarction or mass. No intraparenchymal hemorrhage.

Vascular: Atherosclerotic calcification of internal carotid and
vertebral arteries at skull base

Skull: Intact

Sinuses/Orbits: Clear

Other: N/A
IMPRESSION: Small BILATERAL frontal subdural hematomas, stable on LEFT at 9 mm
greatest thickness but slightly increased at the falx and now
crossing the midline to the RIGHT frontal region measuring 5 mm
thick.

No mass effect.

Small LEFT cerebellar infarct.

No new infarcts identified.

## 2021-11-12 MED ORDER — PROCHLORPERAZINE EDISYLATE 10 MG/2ML IJ SOLN
10.0000 mg | Freq: Four times a day (QID) | INTRAMUSCULAR | Status: DC | PRN
  Administered 2021-11-12: 10 mg via INTRAVENOUS
  Filled 2021-11-12 (×2): qty 2

## 2021-11-12 MED ORDER — MYCOPHENOLATE SODIUM 180 MG PO TBEC
540.0000 mg | DELAYED_RELEASE_TABLET | Freq: Two times a day (BID) | ORAL | Status: DC
  Administered 2021-11-12 – 2021-11-17 (×11): 540 mg via ORAL
  Filled 2021-11-12 (×12): qty 3

## 2021-11-12 MED ORDER — LORAZEPAM 2 MG/ML IJ SOLN
1.0000 mg | Freq: Once | INTRAMUSCULAR | Status: AC
  Administered 2021-11-12: 2 mg via INTRAVENOUS
  Filled 2021-11-12: qty 1

## 2021-11-12 NOTE — Progress Notes (Signed)
[facility] ?Progress Note ? ?Subjective: seen in room, no c/o ?Vitals:  ? 11/12/21 1000 11/12/21 1100 11/12/21 1132 11/12/21 1200  ?BP:      ?Pulse:      ?Resp: 18 (!) '22 19 19  '$ ?Temp:   97.7 ?F (36.5 ?C)   ?TempSrc:   Oral   ?SpO2:      ?Weight:      ?Height:      ? ? ?Exam: ? Awake, Ox 3 ? no jvd ? Chest cta bilat ? Cor reg no RG ? Abd soft ntnd no ascites ?  Ext no UE/ LE edema ?  Neuro as above, nonfocal, no asterixis ?  RUE AVF+bruit ? ? ? OP HD: AF MWF ? 3h 3mn  400/500  59kg  2/2 bath  RUE AVF  15g  Hep 2000 ? - hectorol 6 ug tiw IV ? - no esa ? ?   ?   3/08 - IMPRESSION: Stable bibasilar opacities are noted concerning for possible edema, atelectasis or possibly infiltrates.  ? ? ? ?Assessment/ Plan: ?AMS - no seizures by EEG. Metabolic due to [facility] uremia/ esrd, possible acute SDH. NSurg evaluating. MS better w/ lactulose and HD. Asterixis resolved.  ?COVID pna - basilar infiltrates on xrays. Viral and/ or bacterial infeciton. Getting IV Rocephin. SP molnupiravir. Pt not volume overloaded.  ?ESRD - usual HD MWF. Had HD last night. Next HD Monday.  ?HTN - BP's were low, now high. BP meds resumed. 6kg under dry wt. Euvolemic.  ?H/o failed pancreas/ renal transplant - pancreas still viable taking CyA, pred and myfortic. CyA and myfortic held initially. CyA resume at lower dose '200mg'$  am and '100mg'$  pm. Resume myfortic usual dose when done w/ IV abx. Getting prednisone at 15 qd. Pt not seeing anybody for her pancreas transplant. We will f/u on CyA levels post dc at the OP HD unit.  ?MBD ckd - binders resumed. Ca in range, phos in range. Cont IV vdra ?Anemia ckd - Hb > 10, no esa needs ? ? ? ? ?Rob SDoctor, hospital?11/12/2021, 1:29 PM ? ? ?Recent Labs  ?Lab 11/11/21 ?0225 11/12/21 ?0315  ?K 3.4* 3.7  ?BUN 68* 94*  ?CREATININE 6.74* 8.68*  ?ALBUMIN 2.9* 2.8*  ?CALCIUM 8.4* 8.7*  ?PHOS 7.7* 5.3*  ?HGB 12.8 11.3*  ? ? ?Inpatient medications: ? amLODipine  10 mg Oral Daily  ? vitamin C  500 mg Oral Daily  ?  aspirin EC  81 mg Oral Daily  ? Chlorhexidine Gluconate Cloth  6 each Topical Daily  ? cholecalciferol  1,000 Units Oral Daily  ? cloNIDine  0.1 mg Transdermal Weekly  ? cycloSPORINE modified  100 mg Oral Q1400  ? cycloSPORINE modified  200 mg Oral Q breakfast  ? doxercalciferol  6 mcg Intravenous Q M,W,F-HD  ? famotidine  20 mg Oral Daily  ? ferric citrate  630 mg Oral TID WC  ? labetalol  300 mg Oral TID  ? lactulose  30 g Oral BID  ? mycophenolate  540 mg Oral BID  ? predniSONE  15 mg Oral Q breakfast  ? zinc sulfate  220 mg Oral Daily  ? ? sodium chloride    ? sodium chloride    ? dextrose 5 % and 0.9% NaCl Stopped (11/09/21 1638)  ? ?sodium chloride, sodium chloride, acetaminophen, colchicine, diphenhydrAMINE, heparin, heparin, hydrALAZINE, lidocaine (PF), lidocaine-prilocaine, metoCLOPramide, morphine injection, naLOXone (NARCAN)  injection, ondansetron **OR** ondansetron (ZOFRAN) IV, oxyCODONE, pentafluoroprop-tetrafluoroeth, prochlorperazine ? ? ? ? ? ? ?

## 2021-11-12 NOTE — Evaluation (Signed)
Speech Language Pathology Evaluation Patient Details Name: [name] [name] MRN: [id number] DOB: 1981/03/01 Today's Date: 11/12/2021 Time: 1355-1415 SLP Time Calculation (min) (ACUTE ONLY): 20 min  Problem List:  Patient Active Problem List   Diagnosis Date Noted   Fever 11/07/2021   Acute encephalopathy 11/06/2021   Pulmonary edema 11/06/2021   Hypertensive urgency 11/06/2021   Type 2 diabetes mellitus with peripheral neuropathy ([place]) 11/06/2021   History of renal transplant 11/06/2021   COVID-19 virus infection 11/06/2021   HTN (hypertension), malignant 08/02/2021   ESRD (end stage renal disease) ([place]) 02/25/2021   Coagulation defect, unspecified ([place]) 12/22/2020   Acute cough 10/04/2020   Allergy, unspecified, initial encounter 05/21/2020   Anaphylactic shock, unspecified, initial encounter 05/21/2020   Symptomatic anemia 02/27/2020   Shortness of breath 04/23/2019   Hypocalcemia 12/30/2018   Aortic atherosclerosis ([place]) 12/27/2018   Acute respiratory failure with hypoxia (HCC) 12/22/2018   Hyperkalemia    Bilateral pneumonia 12/21/2018   Pancreas transplant status ([place]) 12/21/2018   Immunosuppression ([place]) 12/21/2018   Chest pain 12/21/2018   CAP (community acquired pneumonia) 12/20/2018   Other staphylococcus as the cause of diseases classified elsewhere 08/16/2018   Mild protein-calorie malnutrition ([place]) 04/18/2018   Encounter for screening for respiratory tuberculosis 04/06/2018   Gout 04/06/2018   Iron deficiency anemia, unspecified 04/06/2018   Leiomyoma of uterus, unspecified 04/06/2018   Secondary hyperparathyroidism of renal origin ([place]) 04/06/2018   Acute rejection of kidney transplant 07/11/2017   Elevated serum creatinine 06/27/2017   E. coli UTI 12/18/2014   Transplant rejection 09/03/2014   Cyst of ovary, left 09/01/2014   Immunosuppressive management encounter following kidney transplant 08/26/2014   Acne 04/28/2014   Itch of skin 04/28/2014    Diabetic gastroparesis associated with type 1 diabetes mellitus ([place]) 11/07/2013   Nausea & vomiting 11/07/2013   Lesion of spleen 37/62/8315   Metabolic acidosis 17/61/6073   Patient's noncompliance with other medical treatment and regimen 09/12/2013   Narcotic dependence ([place]) 08/31/2013   Generalized pain 08/30/2013   Hypomagnesemia 08/30/2013   Renal hematoma of right transplanted kidney 08/21/2013   Hypophosphatemia 07/28/2013   End stage renal disease ([place]) 02/06/2012   Pleural effusion, bilateral 11/16/2011   Anemia in chronic kidney disease (CKD) 11/16/2011   Pericardial effusion 11/15/2011   Fluid overload 11/15/2011   Abdominal pain, epigastric 11/15/2011   CKD (chronic kidney disease) stage 5, GFR less than 15 ml/min (HCC) 11/03/2011   Hypertension 10/27/2011   Renal failure, chronic 10/27/2011   MEDIAL EPICONDYLITIS, LEFT 12/17/2007   ABSCESS, AXILLA, LEFT 12/03/2007   Type 2 diabetes mellitus with other diabetic kidney complication ([place]) 71/02/2693   Past Medical History:  Past Medical History:  Diagnosis Date   Anemia    Anxiety    ESRD (end stage renal disease) ([place])    Henmodialysis   Gastroparesis    Hypertension    Narcotic abuse ([place])    Non compliance with medical treatment    Renal disorder 2015   transplant   Vision loss    Past Surgical History:  Past Surgical History:  Procedure Laterality Date   A/V FISTULAGRAM Right 02/17/2021   Procedure: A/V FISTULAGRAM;  Surgeon: [name], MD;  Location: [place] [facility];  Service: Cardiovascular;  Laterality: Right;   AV FISTULA PLACEMENT  11/17/2011   Procedure: ARTERIOVENOUS (AV) FISTULA CREATION;  Surgeon: Rosetta Posner, MD;  Location: [place];  Service: Vascular;  Laterality: Right;   EYE SURGERY     HEMATOMA  EVACUATION Right 02/25/2021   Procedure: EVACUATION HEMATOMA RIGHT CHEST;  Surgeon: Waynetta Sandy, MD;  Location: [facility];  Service: Vascular;  Laterality: Right;   INSERTION OF  DIALYSIS CATHETER Right 12/08/2020   Procedure: INSERTION OF TUNNELED DIALYSIS CATHETER;  Surgeon: [name], MD;  Location: [facility];  Service: Vascular;  Laterality: Right;   IR REMOVAL TUN CV CATH W/O FL  05/03/2021   KIDNEY TRANSPLANT     NEPHRECTOMY TRANSPLANTED ORGAN     pancrease transplant     PERIPHERAL VASCULAR BALLOON ANGIOPLASTY Right 02/17/2021   Procedure: PERIPHERAL VASCULAR BALLOON ANGIOPLASTY;  Surgeon: [name], MD;  Location: [place] [facility];  Service: Cardiovascular;  Laterality: Right;   REFRACTIVE SURGERY     REVISON OF ARTERIOVENOUS FISTULA Right 12/08/2020   Procedure: RIGHT ARM ARTERIOVENOUS FISTULA REVISION AND RESECTION;  Surgeon: [name], MD;  Location: [place];  Service: Vascular;  Laterality: Right;   REVISON OF ARTERIOVENOUS FISTULA Right 02/25/2021   Procedure: RIGHT ARM ARTERIOVENOUS FISTULA REVISION WITH TRANSPOSITION OF CEPHALIC VEIN ON AXILLARY VEIN;  Surgeon: [name], MD;  Location: [place];  Service: Vascular;  Laterality: Right;   HPI:  41 y.o. female presented via EMS 11/05/20 with AMS. Pt  COVID positive, possible narcotics overdose, given Narcan, found to be hyperammonemia EEG showing diffuse severe encephalopathy. 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 Her sustained attention fluctuated throughout eval, but cannot r/o  impact of reported levels of pain (10/10; notified RN). She was noted to move around/reposition herself repeatedly in bed and fidget with bedding during assessment tasks. Immediate recall of listed items was 100%, with 30% recall after a delay with inclusion of auditory distractions. She is able to follow simple 1-2 step commands and answer simple-mod y/n questions accurately, with greater impairment noted with complex commands and questions. Suspect component of reduced STM and attention impacting her performance during these tasks vs true language impairment. Motor speech was [facility]. Recommend ST services f/u for treatment of cognitive functions and perform further dx treatment of language abilities as indicated.    SLP Assessment  SLP Recommendation/Assessment: Patient needs continued Speech [place] Pathology Services SLP Visit Diagnosis: Cognitive communication deficit (R41.841)    Recommendations for follow up therapy are one component of a multi-disciplinary discharge planning process, led by the attending physician.  Recommendations may be updated based on patient status, additional functional criteria and insurance authorization.    Follow Up Recommendations  Acute inpatient rehab (3hours/day)    Assistance Recommended at Discharge  Intermittent Supervision/Assistance  Functional Status Assessment Patient has had a recent decline in their functional status and demonstrates the ability to make significant improvements in function in a reasonable and predictable amount of time.  Frequency and Duration min 2x/week  2 weeks      SLP Evaluation Cognition  Overall Cognitive Status: Impaired/Different from baseline Arousal/Alertness: Awake/alert Orientation Level: Oriented to person;Oriented to place;Disoriented to time;Disoriented to situation Year: 2023 Month: March Day of Week: Incorrect  Attention: Sustained Sustained Attention: Impaired Sustained Attention Impairment: Verbal  basic Memory: Impaired Memory Impairment: Storage deficit;Retrieval deficit;Decreased recall of new information Immediate Memory Recall: Sock;Blue;Bed Memory Recall Sock: Without Cue Memory Recall Blue: With Cue Memory Recall Bed: With Cue Awareness: Impaired Awareness Impairment: Intellectual impairment Problem Solving: Impaired Problem Solving Impairment: Functional basic Executive Function: Reasoning;Decision Making Reasoning: Impaired Reasoning Impairment: Functional basic Decision Making: Impaired Decision Making Impairment: Functional basic       Comprehension  Auditory Comprehension Overall Auditory Comprehension: Impaired Yes/No Questions: Impaired Complex Questions: 0-24% accurate Commands: Impaired One Step Basic Commands: 75-100% accurate Two Step Basic Commands: 75-100% accurate Complex Commands: 50-74% accurate Conversation: Complex Interfering Components: Attention;Pain;Working Field seismologist: Conservation officer, nature: Not tested Reading Comprehension Reading Status: Not tested    Expression Expression Primary Mode of Expression: Verbal Verbal Expression Overall Verbal Expression: Appears within functional limits for tasks assessed Initiation: No impairment Automatic Speech: Name Level of Generative/Spontaneous Verbalization: Conversation Naming: No impairment Pragmatics: No impairment Interfering Components: Attention Written Expression Dominant Hand: Right Written Expression: Not tested   Oral / Motor  Oral Motor/Sensory Function Overall Oral Motor/Sensory Function: Within functional limits Motor Speech Overall Motor Speech: Appears within functional limits for tasks assessed             Ellwood Dense, [place], [place] Office Number: [phone]  Acie Fredrickson 11/12/2021, 2:39 PM

## 2021-11-12 NOTE — Progress Notes (Signed)
Inpatient Rehab Admissions: ? ?Inpatient Rehab Consult received.  Due to pt being on airborne/COVID precautions, I spoke with patient on the telephone for rehabilitation assessment and to discuss goals and expectations of an inpatient rehab admission.  Pt appeared confused during conversation but did indicate interest in CIR. Pt gave permission to contact friend Lysbeth Galas. Spoke with Lysbeth Galas on the telephone. He confirmed that he would be able to provide intermittent support for pt after discharge. Will continue to follow. ? ?Signed: ?Gayland Curry, MS, CCC-SLP ?Admissions Coordinator ?[phone] ? ? ?

## 2021-11-12 NOTE — Progress Notes (Signed)
[name]                                                                               [name] [name], is a 41 y.o. female, DOB - August 07, 1981, IZT:[id number] Admit date - 11/05/2021    Outpatient Primary MD for the patient is Patient, No Pcp Per (Inactive)  LOS - 6  days    Brief summary   Patient is a 41 year old unfortunate female history of end-stage renal disease on HD on TTS, status post renal and pancreatic transplant, complicated by medical nonadherence and rejection, hypertension, gastroparesis, anxiety, anemia and narcotic abuse presented to the ED with acute onset of altered mental status and confusion.  It was noted that 1 of patient's friends had not heard from her for the last couple of days and EMS called.  When EMS got there patient noted to be conscious but disoriented.  Unknown as to whether patient had a last hemodialysis session.  Patient seen in the ED noted to have pinpoint pupils and altered mental status noted to be somnolent and arousable.  Patient given a dose of IV Narcan which significantly improved her mental status briefly.  Patient seen in the ED COVID-19 PCR obtained was positive.  Influenza PCR negative.  EKG showed normal sinus rhythm and some peaked T waves anterior laterally.  Initial high-sensitivity troponin was 76.  Chest x-ray showed mild pulmonary edema likely secondary to volume overload.  Noncontrast head CT negative. She was transferred to ICU for acute encephalopathy and worsening respiratory status.    Assessment & Plan    Assessment and Plan: * Acute toxic and metabolic  encephalopathy Acute ischemic stroke  Small left frontal sub dural hematoma of unclear etiology possibly coagulopathy.  - This is likely multifactorial.  It could be related to COVID encephalopathy as well as uremia with end-stage renal disease on hemodialysis with question about compliance and certainly polypharmacy with opiate overdose, given response to IV Narcan.,  vs from stroke and sub dural hematoma. -CT head negative for any acute abnormalities on admission.  MRI brain without contrast  on 3/8 showed Small acute infarcts in the posterior paramidline cerebellum bilaterally. She was also found to have anterior left frontal subdural hemorrhage, confirmed by a CT HEAD without contrast.  - Neurology on board and Neurosurgery consulted on 3/9. LP done and does not appear to be consistent with meningitis.  - HSV negative - d/c acyclovir.  - further stroke work up in progress with echocardiogram showed Left ventricular ejection fraction, by estimation, is 55 to 60%. The left ventricle has normal function. The left ventricle has no regional  wall motion abnormalities. There is severe concentric left ventricular  hypertrophy. Left ventricular diastolic  parameters are consistent with Grade I diastolic dysfunction .  carotid duplex pending.   and MRI angio head without contrast showed Negative MRA for large vessel occlusion or other acute finding. Age advanced intracranial small vessel atheromatous disease, but no hemodynamically significant. hemoglobin A1c  is 3.9% Lipid panel shows direct LDL of  60.  Unfortunately not a candidate for aspirin or plavix due to hematoma with low platelets.  Hypercoagulable work up sent by neurology.  Outpatient follow up with Dr Zada Finders  in 2 weeks and keep platelet count greater than 50,000.   Pt very dizzy and nauseated and had one episode of vomiting today.  Therapy eval recommending CIR , consult in place.    ESRD on HD with Monday , Wednesday and Friday scheduled.  Nephrology on board and plan for HD later today.     Pulmonary edema - Pulmonary edema noted on chest x-ray likely secondary to fluid overload secondary to end-stage renal disease with probable noncompliance with hemodialysis.   -Nephrology following.  Hypertensive urgency-  BP parameters well controlled.  Added norvasc 10 mg daily, labetalol 300 mg  TID, ( was taking 600 mg BID) and clonidine 0.1 mg BID.  On prn hydralazine.    Type 2 diabetes mellitus with peripheral neuropathy (HCC) -Repeat hemoglobin A1c 3.9. CBG (last 3)  Recent Labs    11/11/21 1632 11/11/21 2009 11/12/21 0634  GLUCAP 162* 128* 94    No changes in meds. Continue with SSI.    Gout - Colchicine as needed.  History of failed pancreas/ renal transplant -She is status post renal and pancreatic transplant with antirejection medication noncompliance. - She is currently being evaluated for renal transplant. - she was restarted on cyclosporine 200 mg during the day and 100 mg at night, in addition to prednisone 15 mg daily. - will restart myfortic today.  Fever on admission.  From CAP vs COVID infection.  Afebrile in the last 24 hours.  - completed 5 days of IV rocephin.   COVID-19 virus infection - Patient noted to be positive for COVID-19 infection -Follow inflammatory markers. -Patient chronically on prednisone. -completed the course of  molnupiravir. -Supportive care.   Hyperkalemia Resolved with HD.    Chronic thrombocytopenia:  - platelets have been around 120000 in 02/2021, dropped to 67,000 this admission.  - continue to monitor.  -she denies following up with hematology outpatient.   Anemia of chronic disease from ESRD Transfuse to keep hemoglobin greater than 7.   Pancytopenia: ? Secondary to immunosuppressants.  Continue to monitor.    Estimated body mass index is 20.21 kg/m as calculated from the following:   Height as of this encounter: '5\' 4"'$  (1.626 m).   Weight as of this encounter: 53.4 kg.  Code Status: full code.  DVT Prophylaxis:  Place and maintain sequential compression device Start: 11/06/21 0347   Level of Care: Level of care: Telemetry Medical Family Communication: None at bedside.   Disposition Plan:     Remains inpatient appropriate:  CIR  Procedures:  MRI brain.   Consultants:   Neurology Nephrology.   Neuro surgery.   Antimicrobials:   Anti-infectives (From admission, onward)    Start     Dose/Rate Route Frequency Ordered Stop   11/10/21 1800  acyclovir (ZOVIRAX) 260 mg in dextrose 5 % 100 mL IVPB  Status:  Discontinued        260 mg 105.2 mL/hr over 60 Minutes Intravenous Every 24 hours 11/09/21 1231 11/10/21 0857   11/09/21 1500  cefTRIAXone (ROCEPHIN) 2 g in sodium chloride 0.9 % 100 mL IVPB        2 g 200 mL/hr over 30 Minutes Intravenous Every 24 hours 11/09/21 0840 11/11/21 1533   11/09/21 1400  acyclovir (ZOVIRAX) 260 mg in dextrose 5 % 100 mL IVPB        260 mg 105.2 mL/hr over 60 Minutes Intravenous Once 11/09/21 1231 11/09/21 1455   11/09/21 1200  vancomycin (VANCOREADY) IVPB  500 mg/100 mL  Status:  Discontinued        500 mg 100 mL/hr over 60 Minutes Intravenous Every M-W-F (Hemodialysis) 11/07/21 0917 11/09/21 0840   11/09/21 0000  piperacillin-tazobactam (ZOSYN) IVPB 2.25 g  Status:  Discontinued       See Hyperspace for full Linked Orders Report.   2.25 g 100 mL/hr over 30 Minutes Intravenous Every 8 hours 11/08/21 1520 11/09/21 0840   11/08/21 1800  acyclovir (ZOVIRAX) 260 mg in dextrose 5 % 100 mL IVPB  Status:  Discontinued        260 mg 105.2 mL/hr over 60 Minutes Intravenous Every 24 hours 11/08/21 0735 11/09/21 1231   11/08/21 1800  ampicillin (OMNIPEN) 2 g in sodium chloride 0.9 % 100 mL IVPB  Status:  Discontinued        2 g 300 mL/hr over 20 Minutes Intravenous Every 24 hours 11/08/21 0735 11/08/21 1505   11/08/21 1615  piperacillin-tazobactam (ZOSYN) IVPB 3.375 g       See Hyperspace for full Linked Orders Report.   3.375 g 100 mL/hr over 30 Minutes Intravenous  Once 11/08/21 1520 11/08/21 1624   11/07/21 1200  vancomycin (VANCOREADY) IVPB 500 mg/100 mL  Status:  Discontinued        500 mg 100 mL/hr over 60 Minutes Intravenous Every M-W-F (Hemodialysis) 11/07/21 0812 11/07/21 0917   11/07/21 1015  ampicillin (OMNIPEN) 2 g in sodium chloride 0.9 % 100  mL IVPB  Status:  Discontinued        2 g 300 mL/hr over 20 Minutes Intravenous Every 24 hours 11/07/21 0927 11/08/21 0735   11/07/21 1015  acyclovir (ZOVIRAX) 260 mg in dextrose 5 % 100 mL IVPB  Status:  Discontinued        260 mg 105.2 mL/hr over 60 Minutes Intravenous Every 24 hours 11/07/21 0927 11/08/21 0735   11/07/21 0900  vancomycin (VANCOCIN) IVPB 1000 mg/200 mL premix        1,000 mg 200 mL/hr over 60 Minutes Intravenous  Once 11/07/21 0812 11/07/21 0956   11/07/21 0900  ceFEPIme (MAXIPIME) 1 g in sodium chloride 0.9 % 100 mL IVPB  Status:  Discontinued        1 g 200 mL/hr over 30 Minutes Intravenous Daily at bedtime 11/07/21 0812 11/08/21 1520   11/06/21 1000  molnupiravir EUA (LAGEVRIO) capsule 800 mg        4 capsule Oral 2 times daily 11/06/21 0332 11/11/21 0959        Medications  Scheduled Meds:  amLODipine  10 mg Oral Daily   vitamin C  500 mg Oral Daily   aspirin EC  81 mg Oral Daily   Chlorhexidine Gluconate Cloth  6 each Topical Daily   cholecalciferol  1,000 Units Oral Daily   cloNIDine  0.1 mg Transdermal Weekly   cycloSPORINE modified  100 mg Oral Q1400   cycloSPORINE modified  200 mg Oral Q breakfast   doxercalciferol  6 mcg Intravenous Q M,W,F-HD   famotidine  20 mg Oral Daily   ferric citrate  630 mg Oral TID WC   labetalol  300 mg Oral TID   lactulose  30 g Oral BID   mycophenolate  540 mg Oral BID   predniSONE  15 mg Oral Q breakfast   zinc sulfate  220 mg Oral Daily   Continuous Infusions:  sodium chloride     sodium chloride     dextrose 5 % and 0.9% NaCl Stopped (11/09/21 1638)  PRN Meds:.sodium chloride, sodium chloride, acetaminophen, colchicine, diphenhydrAMINE, heparin, heparin, hydrALAZINE, lidocaine (PF), lidocaine-prilocaine, metoCLOPramide, morphine injection, naLOXone (NARCAN)  injection, ondansetron **OR** ondansetron (ZOFRAN) IV, oxyCODONE, pentafluoroprop-tetrafluoroeth, prochlorperazine    Subjective:   [name] [name] was  seen and examined today.  PT reports severe dizziness. And nausea today. Had one episode of vomiting.  Objective:   Vitals:   11/12/21 1100 11/12/21 1132 11/12/21 1200 11/12/21 1515  BP:      Pulse:      Resp: (!) '22 19 19   '$ Temp:  97.7 F (36.5 C)  98 F (36.7 C)  TempSrc:  Oral  Oral  SpO2:      Weight:      Height:        Intake/Output Summary (Last 24 hours) at 11/12/2021 1712 Last data filed at 11/12/2021 0800 Gross per 24 hour  Intake 300 ml  Output 0 ml  Net 300 ml    Filed Weights   11/06/21 0221 11/07/21 0300 11/07/21 0700  Weight: 61.2 kg 56.5 kg 53.4 kg     Exam General exam: ill appearing woman not in distress.  Respiratory system: Clear to auscultation. Respiratory effort normal. Cardiovascular system: S1 & S2 heard, RRR. No JVD,  No pedal edema. Gastrointestinal system: Abdomen is nondistended, soft and nontender. Normal bowel sounds heard. Central nervous system: Alert and oriented to place and person only. Persistent dizziness today since morning. Extremities: Symmetric 5 x 5 power. Skin: No rashes, lesions or ulcers Psychiatry:. Mood & affect appropriate.      Data Reviewed:  I have personally reviewed following labs and imaging studies   CBC Lab Results  Component Value Date   WBC 3.4 (L) 11/12/2021   RBC 3.80 (L) 11/12/2021   HGB 11.3 (L) 11/12/2021   HCT 33.3 (L) 11/12/2021   MCV 87.6 11/12/2021   MCH 29.7 11/12/2021   PLT 67 (L) 11/12/2021   MCHC 33.9 11/12/2021   RDW 12.4 11/12/2021   LYMPHSABS 0.8 11/12/2021   MONOABS 0.2 11/12/2021   EOSABS 0.0 11/12/2021   BASOSABS 0.0 49/70/2637     Last metabolic panel Lab Results  Component Value Date   NA 137 11/12/2021   K 3.7 11/12/2021   CL 97 (L) 11/12/2021   CO2 20 (L) 11/12/2021   BUN 94 (H) 11/12/2021   CREATININE 8.68 (H) 11/12/2021   GLUCOSE 100 (H) 11/12/2021   GFRNONAA 5 (L) 11/12/2021   GFRAA 3 (L) 05/14/2020   CALCIUM 8.7 (L) 11/12/2021   PHOS 5.3 (H) 11/12/2021    PROT 7.0 11/09/2021   ALBUMIN 2.8 (L) 11/12/2021   BILITOT 0.5 11/09/2021   ALKPHOS 58 11/09/2021   AST 44 (H) 11/09/2021   ALT 25 11/09/2021   ANIONGAP 20 (H) 11/12/2021    CBG (last 3)  Recent Labs    11/11/21 1632 11/11/21 2009 11/12/21 0634  GLUCAP 162* 128* 94       Coagulation Profile: No results for input(s): INR, PROTIME in the last 168 hours.   Radiology Studies: CT HEAD WO CONTRAST (5MM)  Result Date: 11/11/2021 CLINICAL DATA:  Follow-up examination for subdural hematoma. EXAM: CT HEAD WITHOUT CONTRAST TECHNIQUE: Contiguous axial images were obtained from the base of the skull through the vertex without intravenous contrast. RADIATION DOSE REDUCTION: This exam was performed according to the departmental dose-optimization program which includes automated exposure control, adjustment of the mA and/or kV according to patient size and/or use of iterative reconstruction technique. COMPARISON:  Prior CT and MRI from 11/09/2021.  FINDINGS: Brain: Previously identified extra-axial hemorrhage overlying the anterior left frontal convexity again seen, not significantly changed in size or morphology measuring up to 9 mm in maximal thickness. Slight extension along the adjacent anterior falx, suggesting that this is subdural in nature. No significant regional mass effect or midline shift. No other new intracranial hemorrhage. Normal expected interval evolution of previously identified cerebellar infarcts. No other new large vessel territory infarct. No hydrocephalus. Vascular: Age advanced atherosclerotic calcifications about the skull base and visualized head. Skull: No scalp soft tissue abnormality. Calvarium intact without abnormality. Sinuses/Orbits: Globes and orbital soft tissues demonstrate no acute finding. Paranasal sinuses and mastoid air cells remain clear. Other: None. IMPRESSION: 1. No significant interval change in size and morphology of extra-axial hemorrhage overlying the  anterior left frontal convexity, measuring up to 9 mm in maximal thickness. No significant regional mass effect or midline shift. No new hemorrhage. 2. Normal expected interval evolution of previously identified cerebellar infarcts. 3. No other new acute intracranial abnormality. Electronically Signed   By: Jeannine Boga M.D.   On: 11/11/2021 02:50   MR ANGIO HEAD WO CONTRAST  Result Date: 11/11/2021 CLINICAL DATA:  Follow-up examination for acute stroke. EXAM: MRA HEAD WITHOUT CONTRAST TECHNIQUE: Angiographic images of the Circle of Willis were acquired using MRA technique without intravenous contrast. COMPARISON:  Prior brain MRI from 11/09/2021. FINDINGS: Anterior circulation: Examination mildly degraded by motion. Visualized distal cervical segments of both internal carotid arteries widely patent with antegrade flow. Petrous segments widely patent bilaterally. Atheromatous irregularity within the carotid siphons without hemodynamically significant stenosis. A1 segments widely patent. Normal anterior communicating artery complex. Mild atheromatous irregularity within the ACAs without significant stenosis. No M1 stenosis or occlusion. Normal right MCA bifurcation. Left MCA trifurcates. No proximal MCA branch occlusion. Distal MCA branches well perfused and symmetric, although demonstrates small vessel atheromatous irregularity. Posterior circulation: Visualized V4 segments widely patent without stenosis. Left vertebral artery slightly dominant. Right PICA patent at its origin. Left PICA origin not visualized. Basilar patent to its distal aspect without stenosis. Superior cerebellar arteries patent bilaterally. Both PCAs primarily supplied via the basilar well perfused or distal aspects. Anatomic variants: None significant. Other: No intracranial aneurysm. IMPRESSION: 1. Negative MRA for large vessel occlusion or other acute finding. 2. Age advanced intracranial small vessel atheromatous disease, but no  hemodynamically significant or correctable stenosis. Electronically Signed   By: Jeannine Boga M.D.   On: 11/11/2021 02:58   ECHOCARDIOGRAM COMPLETE  Result Date: 11/11/2021    ECHOCARDIOGRAM REPORT   Patient Name:   [name] [name] Date of Exam: 11/11/2021 Medical Rec #:  [id number]          Height:       64.0 in Accession #:    [id number]         Weight:       117.7 lb Date of Birth:  May 29, 1981           BSA:          1.562 m Patient Age:    33 years           BP:           114/60 mmHg Patient Gender: F                  HR:           90 bpm. Exam Location:  Inpatient Procedure: 2D Echo, Cardiac Doppler and Color Doppler Indications:    Stroke  History:  Patient has no prior history of Echocardiogram examinations.                 Risk Factors:Hypertension and Diabetes.  Sonographer:    Jyl Heinz Referring Phys: [id number] ASHISH ARORA IMPRESSIONS  1. Left ventricular ejection fraction, by estimation, is 55 to 60%. The left ventricle has normal function. The left ventricle has no regional wall motion abnormalities. There is severe concentric left ventricular hypertrophy. Left ventricular diastolic  parameters are consistent with Grade I diastolic dysfunction (impaired relaxation).  2. Right ventricular systolic function is normal. The right ventricular size is normal. Tricuspid regurgitation signal is inadequate for assessing PA pressure.  3. Moderate pericardial effusion. The pericardial effusion is circumferential. There is no evidence of cardiac tamponade.  4. The mitral valve is abnormal. Trivial mitral valve regurgitation. No evidence of mitral stenosis.  5. The aortic valve is tricuspid. There is mild calcification of the aortic valve. Aortic valve regurgitation is mild. No aortic stenosis is present.  6. The inferior vena cava is normal in size with greater than 50% respiratory variability, suggesting right atrial pressure of 3 mmHg. Comparison(s): No prior Echocardiogram. Hypertrophy,  effusion, and mitral valve are abnormal for age but may be consistent with ESRD. FINDINGS  Left Ventricle: Left ventricular ejection fraction, by estimation, is 55 to 60%. The left ventricle has normal function. The left ventricle has no regional wall motion abnormalities. The left ventricular internal cavity size was normal in size. There is  severe concentric left ventricular hypertrophy. Left ventricular diastolic parameters are consistent with Grade I diastolic dysfunction (impaired relaxation). Right Ventricle: The right ventricular size is normal. No increase in right ventricular wall thickness. Right ventricular systolic function is normal. Tricuspid regurgitation signal is inadequate for assessing PA pressure. Left Atrium: Left atrial size was normal in size. Right Atrium: Right atrial size was normal in size. Pericardium: A moderately sized pericardial effusion is present. The pericardial effusion is circumferential. There is no evidence of cardiac tamponade. Mitral Valve: The mitral valve is abnormal. Mild mitral annular calcification. Trivial mitral valve regurgitation. No evidence of mitral valve stenosis. Tricuspid Valve: The tricuspid valve is normal in structure. Tricuspid valve regurgitation is not demonstrated. No evidence of tricuspid stenosis. Aortic Valve: The aortic valve is tricuspid. There is mild calcification of the aortic valve. Aortic valve regurgitation is mild. No aortic stenosis is present. Aortic valve peak gradient measures 11.3 mmHg. Pulmonic Valve: The pulmonic valve was normal in structure. Pulmonic valve regurgitation is not visualized. No evidence of pulmonic stenosis. Aorta: The aortic root, ascending aorta and aortic arch are all structurally normal, with no evidence of dilitation or obstruction. Venous: The inferior vena cava is normal in size with greater than 50% respiratory variability, suggesting right atrial pressure of 3 mmHg. IAS/Shunts: No atrial level shunt detected  by color flow Doppler.  LEFT VENTRICLE PLAX 2D LVIDd:         3.80 cm     Diastology LVIDs:         2.50 cm     LV e' medial:    4.57 cm/s LV PW:         1.30 cm     LV E/e' medial:  13.5 LV IVS:        1.00 cm     LV e' lateral:   5.33 cm/s LVOT diam:     1.80 cm     LV E/e' lateral: 11.6 LV SV:  44 LV SV Index:   28 LVOT Area:     2.54 cm  LV Volumes (MOD) LV vol d, MOD A2C: 70.0 ml LV vol d, MOD A4C: 78.8 ml LV vol s, MOD A2C: 30.5 ml LV vol s, MOD A4C: 34.1 ml LV SV MOD A2C:     39.5 ml LV SV MOD A4C:     78.8 ml LV SV MOD BP:      42.7 ml RIGHT VENTRICLE             IVC RV Basal diam:  2.70 cm     IVC diam: 1.20 cm RV Mid diam:    1.70 cm RV S prime:     10.80 cm/s TAPSE (M-mode): 1.6 cm LEFT ATRIUM             Index        RIGHT ATRIUM           Index LA diam:        2.90 cm 1.86 cm/m   RA Area:     11.00 cm LA Vol (A2C):   27.8 ml 17.80 ml/m  RA Volume:   23.70 ml  15.17 ml/m LA Vol (A4C):   27.5 ml 17.61 ml/m LA Biplane Vol: 28.5 ml 18.25 ml/m  AORTIC VALVE AV Area (Vmax): 1.89 cm AV Vmax:        168.00 cm/s AV Peak Grad:   11.3 mmHg LVOT Vmax:      125.00 cm/s LVOT Vmean:     85.300 cm/s LVOT VTI:       0.172 m  AORTA Ao Root diam: 2.50 cm Ao Asc diam:  2.70 cm MITRAL VALVE MV Area (PHT): 3.61 cm    SHUNTS MV Decel Time: 210 msec    Systemic VTI:  0.17 m MV E velocity: 61.80 cm/s  Systemic Diam: 1.80 cm MV A velocity: 76.00 cm/s MV E/A ratio:  0.81 Rudean Haskell MD Electronically signed by Rudean Haskell MD Signature Date/Time: 11/11/2021/11:13:28 AM    Final        Hosie Poisson M.D. [name] 11/12/2021, 5:12 PM  Available via Epic secure chat 7am-7pm After 7 pm, please refer to night coverage provider listed on amion.

## 2021-11-12 NOTE — Progress Notes (Signed)
After patient took all daily medication, patient started to vomit. Multiple pills seen in emesis bag. MD made aware. ?

## 2021-11-12 NOTE — Progress Notes (Signed)
Physical Therapy Treatment ?Patient Details ?Name: [name] [name] ?MRN: [id number] ?DOB: Jan 05, 1981 ?Today's Date: 11/12/2021 ? ? ?History of Present Illness 41 y.o. female presented via EMS 11/05/20 with AMS. Pt  COVID positive, possible narcotics overdose, given Narcan, found to be hyperammonemia EEG showing diffuse severe encephalopathy. 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 ?  ?PT Comments  ? ? Pt with very limited mobility due to significant dizziness in setting of cerebellar infarcts. Only able to take a few steps before returning to bed. In light of this poor mobility and significant dizziness updated dc recommendations to acute inpatient rehab.    ?Recommendations for follow up therapy are one component of a multi-disciplinary discharge planning process, led by the attending physician.  Recommendations may be updated based on patient status, additional functional criteria and insurance authorization. ? ?Follow Up Recommendations ? Acute inpatient rehab (3hours/day) ?  ?  ?Assistance Recommended at Discharge Frequent or constant Supervision/Assistance  ?Patient can return home with the following A lot of help with walking and/or transfers;Help with stairs or ramp for entrance ?  ?Equipment Recommendations ? Other (comment) (to be determined)  ?  ?Recommendations for Other Services Rehab consult ? ? ?  ?Precautions / Restrictions Precautions ?Precautions: Fall  ?  ? ?Mobility ? Bed Mobility ?Overal bed mobility: Needs Assistance ?Bed Mobility: Supine to Sit, Sit to Supine ?  ?  ?Supine to sit: Min guard, HOB elevated ?Sit to  supine: Supervision ?  ?General bed mobility comments: Incr time to come to sitting with repeated cues to complete task. Instructed pt to focus on one item to stabilize gaze to decr dizziness with getting up ?  ? ?Transfers ?Overall transfer level: Needs assistance ?Equipment used: 2 person hand held assist ?Transfers: Sit to/from Stand ?Sit to Stand: +2 physical assistance, Min assist ?  ?  ?  ?  ?  ?General transfer comment: Assist to bring hips up and for balance. Cues to stabilize gaze on object ?  ? ?Ambulation/Gait ?Ambulation/Gait assistance: +2 physical assistance, Min assist ?Gait Distance (Feet): 2 Feet (forward and backward) ?Assistive device: 2 person hand held assist ?Gait Pattern/deviations: Step-to pattern, Decreased step length - right, Decreased step length - left, Shuffle, Trunk flexed, Narrow base of support ?Gait velocity: decr ?Gait velocity interpretation: <1.31 ft/sec, indicative of household ambulator ?  ?General Gait Details: Assist for balance and support. Pt requesting to immediately step back to bed and lie down ? ? ?Stairs ?  ?  ?  ?  ?  ? ? ?Wheelchair Mobility ?  ? ?Modified Rankin (Stroke Patients Only) ?  ? ? ?  ?Balance Overall balance assessment: Needs assistance ?Sitting-balance support: No upper extremity supported, Feet supported ?Sitting balance-Leahy Scale: Fair ?  ?  ?Standing balance support: Bilateral upper extremity supported ?Standing balance-Leahy Scale: Poor ?Standing balance comment: UE support and min assist ?  ?  ?  ?  ?  ?  ?  ?  ?  ?  ?  ?  ? ?  ?Cognition Arousal/Alertness: Awake/alert ?Behavior During Therapy: Flat affect ?Overall Cognitive Status: Impaired/Different from baseline ?Area of Impairment: Attention, Memory, Following commands, Awareness, Problem solving ?  ?  ?  ?  ?  ?  ?  ?  ?  ?  Current Attention Level: Selective ?Memory: Decreased short-term memory ?Following Commands: Follows one step commands with increased time ?  ?Awareness:  Intellectual ?Problem Solving: Slow processing, Requires verbal cues, Requires tactile cues ?  ?  ?  ? ?  ?Exercises   ? ?  ?General Comments General comments (skin integrity, edema, etc.): VSS on RA ?  ?  ? ?Pertinent Vitals/Pain Pain Assessment ?Pain Assessment: Faces ?Faces Pain Scale: No hurt  ? ? ?Home Living   ?  ?  ?  ?  ?  ?  ?  ?  ?  ?   ?  ?Prior Function    ?  ?  ?   ? ?PT Goals (current goals can now be found in the care plan section) Acute Rehab PT Goals ?Patient Stated Goal: to lie down ?Time For Goal Achievement: 11/24/21 ?Potential to Achieve Goals: Fair ?Progress towards PT goals: Goals downgraded-see care plan ? ?  ?Frequency ? ? ? Min 4X/week ? ? ? ?  ?PT Plan Discharge plan needs to be updated;Frequency needs to be updated  ? ? ?Co-evaluation   ?  ?  ?  ?  ? ?  ?AM-PAC PT "6 Clicks" Mobility   ?Outcome Measure ? Help needed turning from your back to your side while in a flat bed without using bedrails?: A Little ?Help needed moving from lying on your back to sitting on the side of a flat bed without using bedrails?: A Little ?Help needed moving to and from a bed to a chair (including a wheelchair)?: Total ?Help needed standing up from a chair using your arms (e.g., wheelchair or bedside chair)?: Total ?Help needed to walk in hospital room?: Total ?Help needed climbing 3-5 steps with a railing? : Total ?6 Click Score: 10 ? ?  ?End of Session   ?Activity Tolerance: Patient limited by fatigue;Other (comment) (dizziness) ?Patient left: in bed;with call bell/phone within reach;with nursing/sitter in room ?Nurse Communication: Mobility status ?PT Visit Diagnosis: Other abnormalities of gait and mobility (R26.89);Other symptoms and signs involving the nervous system (R29.898);Muscle weakness (generalized) (M62.81) ?  ? ? ?Time: 2706-2376 ?PT Time Calculation (min) (ACUTE ONLY): 15 min ? ?Charges:  $Therapeutic Activity: 8-22 mins          ?          ? ?[facility] PT ?Acute Rehabilitation  Services ?Pager [phone] ?Office [phone] ? ? ? ?Shary Decamp [facility] ?11/12/2021, 1:24 PM ? ?

## 2021-11-12 NOTE — Plan of Care (Signed)
?  Problem: Education: Goal: Knowledge of General Education information will improve Description: Including pain rating scale, medication(s)/side effects and non-pharmacologic comfort measures Outcome: Progressing   Problem: Health Behavior/Discharge Planning: Goal: Ability to manage health-related needs will improve Outcome: Progressing   Problem: Clinical Measurements: Goal: Ability to maintain clinical measurements within normal limits will improve Outcome: Progressing Goal: Will remain free from infection Outcome: Progressing Goal: Diagnostic test results will improve Outcome: Progressing Goal: Respiratory complications will improve Outcome: Progressing Goal: Cardiovascular complication will be avoided Outcome: Progressing   Problem: Activity: Goal: Risk for activity intolerance will decrease Outcome: Progressing   Problem: Nutrition: Goal: Adequate nutrition will be maintained Outcome: Progressing   Problem: Coping: Goal: Level of anxiety will decrease Outcome: Progressing   Problem: Elimination: Goal: Will not experience complications related to bowel motility Outcome: Progressing Goal: Will not experience complications related to urinary retention Outcome: Progressing   Problem: Pain Managment: Goal: General experience of comfort will improve Outcome: Progressing   Problem: Safety: Goal: Ability to remain free from injury will improve Outcome: Progressing   Problem: Skin Integrity: Goal: Risk for impaired skin integrity will decrease Outcome: Progressing   Problem: Education: Goal: Knowledge of disease or condition will improve Outcome: Progressing Goal: Knowledge of secondary prevention will improve (SELECT ALL) Outcome: Progressing Goal: Knowledge of patient specific risk factors will improve (INDIVIDUALIZE FOR PATIENT) Outcome: Progressing Goal: Individualized Educational Video(s) Outcome: Progressing   Problem: Coping: Goal: Will verbalize  positive feelings about self Outcome: Progressing Goal: Will identify appropriate support needs Outcome: Progressing   Problem: Health Behavior/Discharge Planning: Goal: Ability to manage health-related needs will improve Outcome: Progressing   Problem: Self-Care: Goal: Ability to participate in self-care as condition permits will improve Outcome: Progressing Goal: Verbalization of feelings and concerns over difficulty with self-care will improve Outcome: Progressing Goal: Ability to communicate needs accurately will improve Outcome: Progressing   Problem: Nutrition: Goal: Risk of aspiration will decrease Outcome: Progressing   Problem: Ischemic Stroke/TIA Tissue Perfusion: Goal: Complications of ischemic stroke/TIA will be minimized Outcome: Progressing   

## 2021-11-13 LAB — RENAL FUNCTION PANEL
Albumin: 2.9 g/dL — ABNORMAL LOW (ref 3.5–5.0)
Anion gap: 14 (ref 5–15)
BUN: 49 mg/dL — ABNORMAL HIGH (ref 6–20)
CO2: 24 mmol/L (ref 22–32)
Calcium: 8.6 mg/dL — ABNORMAL LOW (ref 8.9–10.3)
Chloride: 99 mmol/L (ref 98–111)
Creatinine, Ser: 6.09 mg/dL — ABNORMAL HIGH (ref 0.44–1.00)
GFR, Estimated: 8 mL/min — ABNORMAL LOW (ref >60)
Glucose, Bld: 89 mg/dL (ref 70–99)
Phosphorus: 6.2 mg/dL — ABNORMAL HIGH (ref 2.5–4.6)
Potassium: 3.3 mmol/L — ABNORMAL LOW (ref 3.5–5.1)
Sodium: 137 mmol/L (ref 135–145)

## 2021-11-13 LAB — IMMATURE PLATELET FRACTION: Immature Platelet Fraction: 16.7 % — ABNORMAL HIGH (ref 1.2–8.6)

## 2021-11-13 MED ORDER — MECLIZINE HCL 25 MG PO TABS
25.0000 mg | ORAL_TABLET | Freq: Three times a day (TID) | ORAL | Status: DC | PRN
  Administered 2021-11-13 – 2021-11-15 (×3): 25 mg via ORAL
  Filled 2021-11-13 (×3): qty 1

## 2021-11-13 MED ORDER — SODIUM CHLORIDE 0.9 % IV SOLN: INTRAVENOUS | Status: AC

## 2021-11-13 MED ORDER — SODIUM CHLORIDE 0.9 % IV SOLN
INTRAVENOUS | Status: DC
Start: 2021-11-13 — End: 2021-11-13

## 2021-11-13 NOTE — Progress Notes (Signed)
[facility] ?Progress Note ? ?Subjective: seen in room, no c/o. CIR evaluating.  ?Vitals:  ? 11/13/21 0600 11/13/21 0700 11/13/21 0750 11/13/21 1100  ?BP: (!) 144/73 129/67    ?Pulse:      ?Resp: 16 12    ?Temp:   98.4 ?F (36.9 ?C) 97.7 ?F (36.5 ?C)  ?TempSrc:   Oral Oral  ?SpO2:  100%    ?Weight:      ?Height:      ? ? ?Exam: ? Awake, Ox 3 ? no jvd ? Chest cta bilat ? Cor reg no RG ? Abd soft ntnd no ascites ?  Ext no UE/ LE edema ?  Neuro as above, nonfocal, no asterixis ?  RUE AVF+bruit ? ? ? OP HD: AF MWF ? 3h 57mn  400/500  59kg  2/2 bath  RUE AVF  15g  Hep 2000 ? - hectorol 6 ug tiw IV ? - no esa ? ?   ?   3/08 - IMPRESSION: Stable bibasilar opacities are noted concerning for possible edema, atelectasis or possibly infiltrates.  ? ? ? ?Assessment/ Plan: ?AMS - no seizures by EEG. Metabolic due to ^[facility] uremia/ esrd, possible acute SDH. NSurg evaluating. MS better w/ lactulose and HD. Asterixis resolved.  ?COVID pna - basilar infiltrates on xrays. Viral and/ or bacterial infeciton. Getting IV Rocephin. SP molnupiravir. Pt not volume overloaded. COVID precautions should lift on 11/15/21.  ?ESRD - usual HD MWF. Next HD Monday.  ?HTN - BP's were low, now high. BP meds resumed. 6kg under dry wt. Still looking dry, will give NS at 75 cc/hr through the next 18 hrs. Keep even w/ next HD.  ?H/o failed pancreas/ renal transplant - pancreas still viable taking CyA, pred and myfortic. CyA and myfortic held initially. CyA was resumed at slightly lower dose of '200mg'$  am and '100mg'$  pm (high trough off 309 here). Myfortic was restarted after IV abx were completed on 3/11 at the usual dose . Getting prednisone at 15 qd, this can probably be tapered back down to usual dose 5 mg/ d. Pt not seeing anybody for her pancreas transplant. We will f/u on CyA levels post dc at the OP HD unit.  ?MBD ckd - binders resumed. Ca in range, phos in range. Cont IV vdra ?Anemia ckd - Hb > 10, no esa needs ? ? ? ? ?Rob  SDoctor, hospital?11/13/2021, 3:12 PM ? ? ?Recent Labs  ?Lab 11/11/21 ?0225 11/12/21 ?0175103/12/23 ?0037  ?K 3.4* 3.7 3.3*  ?BUN 68* 94* 49*  ?CREATININE 6.74* 8.68* 6.09*  ?ALBUMIN 2.9* 2.8* 2.9*  ?CALCIUM 8.4* 8.7* 8.6*  ?PHOS 7.7* 5.3* 6.2*  ?HGB 12.8 11.3*  --   ? ? ?Inpatient medications: ? amLODipine  10 mg Oral Daily  ? vitamin C  500 mg Oral Daily  ? aspirin EC  81 mg Oral Daily  ? Chlorhexidine Gluconate Cloth  6 each Topical Daily  ? cholecalciferol  1,000 Units Oral Daily  ? cloNIDine  0.1 mg Transdermal Weekly  ? cycloSPORINE modified  100 mg Oral Q1400  ? cycloSPORINE modified  200 mg Oral Q breakfast  ? doxercalciferol  6 mcg Intravenous Q M,W,F-HD  ? famotidine  20 mg Oral Daily  ? ferric citrate  630 mg Oral TID WC  ? labetalol  300 mg Oral TID  ? lactulose  30 g Oral BID  ? mycophenolate  540 mg Oral BID  ? predniSONE  15 mg Oral Q breakfast  ? zinc  sulfate  220 mg Oral Daily  ? ? sodium chloride    ? sodium chloride    ? sodium chloride    ? ?sodium chloride, sodium chloride, acetaminophen, colchicine, diphenhydrAMINE, heparin, heparin, hydrALAZINE, lidocaine (PF), lidocaine-prilocaine, meclizine, metoCLOPramide, morphine injection, naLOXone (NARCAN)  injection, ondansetron **OR** ondansetron (ZOFRAN) IV, oxyCODONE, pentafluoroprop-tetrafluoroeth, prochlorperazine ? ? ? ? ? ? ?

## 2021-11-13 NOTE — Progress Notes (Signed)
[name]                                                                               [name] [name], is a 41 y.o. female, DOB - [birth year], QPY:[id number] Admit date - 11/05/2021    Outpatient Primary MD for the patient is Patient, No Pcp Per (Inactive)  LOS - 7  days    Brief summary   Patient is a 41 year old unfortunate female history of end-stage renal disease on HD on TTS, status post renal and pancreatic transplant, complicated by medical nonadherence and rejection, hypertension, gastroparesis, anxiety, anemia and narcotic abuse presented to the ED with acute onset of altered mental status and confusion.  It was noted that 1 of patient's friends had not heard from her for the last couple of days and EMS called.  When EMS got there patient noted to be conscious but disoriented.  Unknown as to whether patient had a last hemodialysis session.  Patient seen in the ED noted to have pinpoint pupils and altered mental status noted to be somnolent and arousable.  Patient given a dose of IV Narcan which significantly improved her mental status briefly.  Patient seen in the ED COVID-19 PCR obtained was positive.  Influenza PCR negative.  EKG showed normal sinus rhythm and some peaked T waves anterior laterally.  Initial high-sensitivity troponin was 76.  Chest x-ray showed mild pulmonary edema likely secondary to volume overload.  Noncontrast head CT negative. She was transferred to ICU for acute encephalopathy and worsening respiratory status.    Assessment & Plan    Assessment and Plan: * Acute toxic and metabolic  encephalopathy Acute ischemic stroke  Small left frontal sub dural hematoma of unclear etiology possibly coagulopathy.  - This is likely multifactorial.  It could be related to COVID encephalopathy as well as uremia with end-stage renal disease on hemodialysis with question about compliance and certainly polypharmacy with opiate overdose, given response to IV Narcan.,  vs from stroke and sub dural hematoma. -CT head negative for any acute abnormalities on admission.  MRI brain without contrast  on 3/8 showed Small acute infarcts in the posterior paramidline cerebellum bilaterally. She was also found to have anterior left frontal subdural hemorrhage, confirmed by a CT HEAD without contrast.  - Neurology on board and Neurosurgery consulted on 3/9. LP done and does not appear to be consistent with meningitis.  - HSV negative - d/c acyclovir.  - further stroke work up in progress with echocardiogram showed Left ventricular ejection fraction, by estimation, is 55 to 60%. The left ventricle has normal function. The left ventricle has no regional  wall motion abnormalities. There is severe concentric left ventricular  hypertrophy. Left ventricular diastolic  parameters are consistent with Grade I diastolic dysfunction .  carotid duplex pending.   and MRI angio head without contrast showed Negative MRA for large vessel occlusion or other acute finding. Age advanced intracranial small vessel atheromatous disease, but no hemodynamically significant. hemoglobin A1c  is 3.9% Lipid panel shows direct LDL of  60.  Unfortunately not a candidate for aspirin or plavix due to hematoma with low platelets.  Hypercoagulable work up sent by neurology.  Outpatient follow up with Dr Zada Finders  in 2 weeks and keep platelet count greater than 50,000.   Pt very dizzy and nauseated and had one episode of vomiting today.  Therapy eval recommending CIR , consult in place.    ESRD on HD with Monday , Wednesday and Friday scheduled.  Nephrology on board . Appreciate recommendations.     Pulmonary edema - Pulmonary edema noted on chest x-ray likely secondary to fluid overload secondary to end-stage renal disease with probable noncompliance with hemodialysis.   -Nephrology following.  Hypertensive urgency-  BP parameters better controlled.  Added norvasc 10 mg daily, labetalol 300 mg  TID, ( was taking 600 mg BID) and clonidine 0.1 mg BID.  On prn hydralazine.    Type 2 diabetes mellitus with peripheral neuropathy (HCC) -Repeat hemoglobin A1c 3.9. CBG (last 3)  Recent Labs    11/11/21 1632 11/11/21 2009 11/12/21 0634  GLUCAP 162* 128* 94    No changes in meds. Continue with SSI.    Gout - Colchicine as needed.  History of failed pancreas/ renal transplant -She is status post renal and pancreatic transplant with antirejection medication noncompliance. - She is currently being evaluated for renal transplant. - she was restarted on cyclosporine 200 mg during the day and 100 mg at night, in addition to prednisone 15 mg daily. - will restart myfortic .  - will plan for tapering of steroids starting tomorrow.   Fever on admission.  From CAP vs COVID infection.  Afebrile in the last 24 hours.  - completed 5 days of IV rocephin.   COVID-19 virus infection - Patient noted to be positive for COVID-19 infection -Follow inflammatory markers. -Patient chronically on prednisone. -completed the course of  molnupiravir. -Supportive care.   Hyperkalemia Resolved with HD.    Chronic thrombocytopenia:  - platelets have been around 120000 in 02/2021, dropped to 67,000 this admission.  - continue to monitor.  -hematology consulted.  - check CBC in am.   Anemia of chronic disease from ESRD Transfuse to keep hemoglobin greater than 7.   Pancytopenia: ? Secondary to immunosuppressants.  Continue to monitor.  Dr Burr Medico consulted.    Estimated body mass index is 20.21 kg/m as calculated from the following:   Height as of this encounter: '5\' 4"'$  (1.626 m).   Weight as of this encounter: 53.4 kg.  Code Status: full code.  DVT Prophylaxis:  Place and maintain sequential compression device Start: 11/06/21 0347   Level of Care: Level of care: med surg.  Family Communication: None at bedside.   Disposition Plan:     Remains inpatient appropriate:   CIR  Procedures:  MRI brain.   Consultants:   Neurology Nephrology.  Neuro surgery.   Antimicrobials:   Anti-infectives (From admission, onward)    Start     Dose/Rate Route Frequency Ordered Stop   11/10/21 1800  acyclovir (ZOVIRAX) 260 mg in dextrose 5 % 100 mL IVPB  Status:  Discontinued        260 mg 105.2 mL/hr over 60 Minutes Intravenous Every 24 hours 11/09/21 1231 11/10/21 0857   11/09/21 1500  cefTRIAXone (ROCEPHIN) 2 g in sodium chloride 0.9 % 100 mL IVPB        2 g 200 mL/hr over 30 Minutes Intravenous Every 24 hours 11/09/21 0840 11/11/21 1533   11/09/21 1400  acyclovir (ZOVIRAX) 260 mg in dextrose 5 % 100 mL IVPB        260 mg 105.2 mL/hr over 60 Minutes  Intravenous Once 11/09/21 1231 11/09/21 1455   11/09/21 1200  vancomycin (VANCOREADY) IVPB 500 mg/100 mL  Status:  Discontinued        500 mg 100 mL/hr over 60 Minutes Intravenous Every M-W-F (Hemodialysis) 11/07/21 0917 11/09/21 0840   11/09/21 0000  piperacillin-tazobactam (ZOSYN) IVPB 2.25 g  Status:  Discontinued       See Hyperspace for full Linked Orders Report.   2.25 g 100 mL/hr over 30 Minutes Intravenous Every 8 hours 11/08/21 1520 11/09/21 0840   11/08/21 1800  acyclovir (ZOVIRAX) 260 mg in dextrose 5 % 100 mL IVPB  Status:  Discontinued        260 mg 105.2 mL/hr over 60 Minutes Intravenous Every 24 hours 11/08/21 0735 11/09/21 1231   11/08/21 1800  ampicillin (OMNIPEN) 2 g in sodium chloride 0.9 % 100 mL IVPB  Status:  Discontinued        2 g 300 mL/hr over 20 Minutes Intravenous Every 24 hours 11/08/21 0735 11/08/21 1505   11/08/21 1615  piperacillin-tazobactam (ZOSYN) IVPB 3.375 g       See Hyperspace for full Linked Orders Report.   3.375 g 100 mL/hr over 30 Minutes Intravenous  Once 11/08/21 1520 11/08/21 1624   11/07/21 1200  vancomycin (VANCOREADY) IVPB 500 mg/100 mL  Status:  Discontinued        500 mg 100 mL/hr over 60 Minutes Intravenous Every M-W-F (Hemodialysis) 11/07/21 0812 11/07/21  0917   11/07/21 1015  ampicillin (OMNIPEN) 2 g in sodium chloride 0.9 % 100 mL IVPB  Status:  Discontinued        2 g 300 mL/hr over 20 Minutes Intravenous Every 24 hours 11/07/21 0927 11/08/21 0735   11/07/21 1015  acyclovir (ZOVIRAX) 260 mg in dextrose 5 % 100 mL IVPB  Status:  Discontinued        260 mg 105.2 mL/hr over 60 Minutes Intravenous Every 24 hours 11/07/21 0927 11/08/21 0735   11/07/21 0900  vancomycin (VANCOCIN) IVPB 1000 mg/200 mL premix        1,000 mg 200 mL/hr over 60 Minutes Intravenous  Once 11/07/21 0812 11/07/21 0956   11/07/21 0900  ceFEPIme (MAXIPIME) 1 g in sodium chloride 0.9 % 100 mL IVPB  Status:  Discontinued        1 g 200 mL/hr over 30 Minutes Intravenous Daily at bedtime 11/07/21 0812 11/08/21 1520   11/06/21 1000  molnupiravir EUA (LAGEVRIO) capsule 800 mg        4 capsule Oral 2 times daily 11/06/21 0332 11/11/21 0959        Medications  Scheduled Meds:  amLODipine  10 mg Oral Daily   vitamin C  500 mg Oral Daily   aspirin EC  81 mg Oral Daily   Chlorhexidine Gluconate Cloth  6 each Topical Daily   cholecalciferol  1,000 Units Oral Daily   cloNIDine  0.1 mg Transdermal Weekly   cycloSPORINE modified  100 mg Oral Q1400   cycloSPORINE modified  200 mg Oral Q breakfast   doxercalciferol  6 mcg Intravenous Q M,W,F-HD   famotidine  20 mg Oral Daily   ferric citrate  630 mg Oral TID WC   labetalol  300 mg Oral TID   lactulose  30 g Oral BID   mycophenolate  540 mg Oral BID   predniSONE  15 mg Oral Q breakfast   zinc sulfate  220 mg Oral Daily   Continuous Infusions:  sodium chloride     sodium  chloride     sodium chloride     PRN Meds:.sodium chloride, sodium chloride, acetaminophen, colchicine, diphenhydrAMINE, heparin, heparin, hydrALAZINE, lidocaine (PF), lidocaine-prilocaine, meclizine, metoCLOPramide, morphine injection, naLOXone (NARCAN)  injection, ondansetron **OR** ondansetron (ZOFRAN) IV, oxyCODONE, pentafluoroprop-tetrafluoroeth,  prochlorperazine    Subjective:   [name] [name] was seen and examined today.  She reports persistent dizziness. No chest pain or sob, nausea has improved.  Objective:   Vitals:   11/13/21 0600 11/13/21 0700 11/13/21 0750 11/13/21 1100  BP: (!) 144/73 129/67    Pulse:      Resp: 16 12    Temp:   98.4 F (36.9 C) 97.7 F (36.5 C)  TempSrc:   Oral Oral  SpO2:  100%    Weight:      Height:       No intake or output data in the 24 hours ending 11/13/21 1441  Filed Weights   11/06/21 0221 11/07/21 0300 11/07/21 0700  Weight: 61.2 kg 56.5 kg 53.4 kg     Exam General exam: Appears calm and comfortable  Respiratory system: Clear to auscultation. Respiratory effort normal. Cardiovascular system: S1 & S2 heard, RRR. No JVD,. No pedal edema. Gastrointestinal system: Abdomen is nondistended, soft and nontender. Normal bowel sounds heard. Central nervous system: Alert and oriented. No focal neurological deficits. Extremities: no pedal edema Skin: No rashes, lesions or ulcers Psychiatry:  Mood & affect appropriate.       Data Reviewed:  I have personally reviewed following labs and imaging studies   CBC Lab Results  Component Value Date   WBC 3.4 (L) 11/12/2021   RBC 3.80 (L) 11/12/2021   HGB 11.3 (L) 11/12/2021   HCT 33.3 (L) 11/12/2021   MCV 87.6 11/12/2021   MCH 29.7 11/12/2021   PLT 67 (L) 11/12/2021   MCHC 33.9 11/12/2021   RDW 12.4 11/12/2021   LYMPHSABS 0.8 11/12/2021   MONOABS 0.2 11/12/2021   EOSABS 0.0 11/12/2021   BASOSABS 0.0 03/03/1600     Last metabolic panel Lab Results  Component Value Date   NA 137 11/13/2021   K 3.3 (L) 11/13/2021   CL 99 11/13/2021   CO2 24 11/13/2021   BUN 49 (H) 11/13/2021   CREATININE 6.09 (H) 11/13/2021   GLUCOSE 89 11/13/2021   GFRNONAA 8 (L) 11/13/2021   GFRAA 3 (L) 05/14/2020   CALCIUM 8.6 (L) 11/13/2021   PHOS 6.2 (H) 11/13/2021   PROT 7.0 11/09/2021   ALBUMIN 2.9 (L) 11/13/2021   BILITOT 0.5 11/09/2021    ALKPHOS 58 11/09/2021   AST 44 (H) 11/09/2021   ALT 25 11/09/2021   ANIONGAP 14 11/13/2021    CBG (last 3)  Recent Labs    11/11/21 1632 11/11/21 2009 11/12/21 0634  GLUCAP 162* 128* 94       Coagulation Profile: No results for input(s): INR, PROTIME in the last 168 hours.   Radiology Studies: CT HEAD WO CONTRAST (5MM)  Result Date: 11/12/2021 CLINICAL DATA:  New onset of dizziness, altered mental status, encephalopathy, single episode of vomiting, history stroke, hypertension, end-stage renal disease EXAM: CT HEAD WITHOUT CONTRAST TECHNIQUE: Contiguous axial images were obtained from the base of the skull through the vertex without intravenous contrast. RADIATION DOSE REDUCTION: This exam was performed according to the departmental dose-optimization program which includes automated exposure control, adjustment of the mA and/or kV according to patient size and/or use of iterative reconstruction technique. COMPARISON:  11/11/2021 FINDINGS: Brain: Generalized atrophy. Normal ventricular morphology. No midline shift or mass effect.  Small BILATERAL medial cerebellar infarcts. Small frontal subdural hematomas are seen bilaterally, greater on LEFT at 9 mm thick stable but now crossing the midline to RIGHT 5 mm thick and slightly increased along anterior falx since previous exam. No midline shift or mass effect. No new areas of infarction or mass. No intraparenchymal hemorrhage. Vascular: Atherosclerotic calcification of internal carotid and vertebral arteries at skull base Skull: Intact Sinuses/Orbits: Clear Other: N/A IMPRESSION: Small BILATERAL frontal subdural hematomas, stable on LEFT at 9 mm greatest thickness but slightly increased at the falx and now crossing the midline to the RIGHT frontal region measuring 5 mm thick. No mass effect. Small LEFT cerebellar infarct. No new infarcts identified. Electronically Signed   By: Lavonia Dana M.D.   On: 11/12/2021 18:42       Hosie Poisson  M.D. [name] 11/13/2021, 2:41 PM  Available via Epic secure chat 7am-7pm After 7 pm, please refer to night coverage provider listed on amion.

## 2021-11-13 NOTE — Progress Notes (Signed)
Patient refusing to stay on cardiac monitoring. Dr. Jonnie Finner at bedside, stated that patient doesn't have a need for cardiac monitoring and will recommend discontinuing it. ?

## 2021-11-13 NOTE — Consult Note (Addendum)
[place]  [phone]   HEMATOLOGY ONCOLOGY INPATIENT CONSULTATION   [name] [name]  DOB: 10/27/1980  MR#: [id number]  CSN#: [id number]    Requesting Physician: [name] Dr. Karleen Hampshire   Patient Care Team: Patient, No Pcp Per (Inactive) as PCP - General (General Practice) Elmarie Shiley, MD (Nephrology) Fleet Contras, MD as Referring Physician (Nephrology) Center, [facility]  Reason for consult: CVA and subdural hemorrhage, moderate thrombocytopenia  History of present illness:   Patient is a 41 year old female with past medical history of end-stage renal disease on hemodialysis, status post renal and pancreatic transplant, hypertension, anemia and narcotic abuse, presented with acute altered mental status to ED on 11/06/2021.  Her mental status improved with Narcan, she was found to be COVID-positive and febrile, brain MRI showed small acute infarcts and small anterior left frontal subdural hemorrhage.  She was treated for pulmonary edema, hypertensive urgency, and completed course of molnupiravir for COVID. She is on mycophenolate 540 mg twice daily, prednisone 15 mg daily, and cyclosporine 200 mg twice daily, she is also on epo for her anemia.  She had very mild thrombopenia in the past year with platelet 120-145K, it was 79K on admission 11/05/21, and remains in 50-70k since admission. She denies bleeding.    MEDICAL HISTORY:  Past Medical History:  Diagnosis Date   Anemia    Anxiety    ESRD (end stage renal disease) ([place])    Henmodialysis   Gastroparesis    Hypertension    Narcotic abuse ([place])    Non compliance with medical treatment    Renal disorder 2015   transplant   Vision loss     SURGICAL HISTORY: Past Surgical History:  Procedure Laterality Date   A/V FISTULAGRAM Right 02/17/2021   Procedure: A/V FISTULAGRAM;  Surgeon: [name], MD;  Location: [place] [facility];  Service: Cardiovascular;  Laterality: Right;    AV FISTULA PLACEMENT  11/17/2011   Procedure: ARTERIOVENOUS (AV) FISTULA CREATION;  Surgeon: Rosetta Posner, MD;  Location: [place];  Service: Vascular;  Laterality: Right;   EYE SURGERY     HEMATOMA EVACUATION Right 02/25/2021   Procedure: EVACUATION HEMATOMA RIGHT CHEST;  Surgeon: Waynetta Sandy, MD;  Location: [place];  Service: Vascular;  Laterality: Right;   INSERTION OF DIALYSIS CATHETER Right 12/08/2020   Procedure: INSERTION OF TUNNELED DIALYSIS CATHETER;  Surgeon: [name], MD;  Location: [facility];  Service: Vascular;  Laterality: Right;   IR REMOVAL TUN CV CATH W/O FL  05/03/2021   KIDNEY TRANSPLANT     NEPHRECTOMY TRANSPLANTED ORGAN     pancrease transplant     PERIPHERAL VASCULAR BALLOON ANGIOPLASTY Right 02/17/2021   Procedure: PERIPHERAL VASCULAR BALLOON ANGIOPLASTY;  Surgeon: [name], MD;  Location: [place] [facility];  Service: Cardiovascular;  Laterality: Right;   REFRACTIVE SURGERY     REVISON OF ARTERIOVENOUS FISTULA Right 12/08/2020   Procedure: RIGHT ARM ARTERIOVENOUS FISTULA REVISION AND RESECTION;  Surgeon: [name], MD;  Location: [place];  Service: Vascular;  Laterality: Right;   REVISON OF ARTERIOVENOUS FISTULA Right 02/25/2021   Procedure: RIGHT ARM ARTERIOVENOUS FISTULA REVISION WITH TRANSPOSITION OF CEPHALIC VEIN ON AXILLARY VEIN;  Surgeon: [name], MD;  Location: [place];  Service: Vascular;  Laterality: Right;    SOCIAL HISTORY: Social History   Socioeconomic History   Marital status: Married    Spouse name: Not on file   Number of children: Not on file  Years of education: Not on file   Highest education level: Not on file  Occupational History   Not on file  Tobacco Use   Smoking status: Former    Types: Pipe   Smokeless tobacco: Never   Tobacco comments:    Hooka  Vaping Use   Vaping Use: Never used  Substance and Sexual Activity   Alcohol use: No   Drug use: No   Sexual activity: Not Currently   Other Topics Concern   Not on file  Social History Narrative   Worked for [employer]--as did husband. Had to leave [place] for safety reasons. Has been in [place] since 9/09.   Social Determinants of Health   Financial Resource Strain: Not on file  Food Insecurity: Not on file  Transportation Needs: Not on file  Physical Activity: Not on file  Stress: Not on file  Social Connections: Not on file  Intimate Partner Violence: Not on file    FAMILY HISTORY: Family History  Problem Relation Age of Onset   Hypertension Father     ALLERGIES:  is allergic to iodinated contrast media.  MEDICATIONS:  Current Facility-Administered Medications  Medication Dose Route Frequency Provider Last Rate Last Admin   0.9 %  sodium chloride infusion  100 mL Intravenous PRN Pearson Grippe B, MD       0.9 %  sodium chloride infusion  100 mL Intravenous PRN Pearson Grippe B, MD       0.9 %  sodium chloride infusion   Intravenous Continuous Roney Jaffe, MD 75 mL/hr at 11/13/21 1651 New Bag at 11/13/21 1651   acetaminophen (TYLENOL) tablet 650 mg  650 mg Oral Q6H PRN Hosie Poisson, MD   650 mg at 11/12/21 2252   amLODipine (NORVASC) tablet 10 mg  10 mg Oral Daily Hosie Poisson, MD   10 mg at 11/13/21 1052   ascorbic acid (VITAMIN C) tablet 500 mg  500 mg Oral Daily Hosie Poisson, MD   500 mg at 11/13/21 1053   aspirin EC tablet 81 mg  81 mg Oral Daily Hosie Poisson, MD   81 mg at 11/13/21 1054   Chlorhexidine Gluconate Cloth 2 % PADS 6 each  6 each Topical Daily Mansy, Jan A, MD   6 each at 11/12/21 1100   cholecalciferol (VITAMIN D3) tablet 1,000 Units  1,000 Units Oral Daily Mansy, Jan A, MD   1,000 Units at 11/13/21 1053   cloNIDine (CATAPRES - Dosed in mg/24 hr) patch 0.1 mg  0.1 mg Transdermal Weekly Hosie Poisson, MD   0.1 mg at 11/11/21 1637   colchicine tablet 0.6 mg  0.6 mg Oral TID PRN Hosie Poisson, MD   0.6 mg at 11/10/21 1047   cycloSPORINE modified (NEORAL) capsule 100 mg  100 mg Oral Q1400 Roney Jaffe, MD   100 mg at 11/11/21 1456   cycloSPORINE modified (NEORAL) capsule 200 mg  200 mg Oral Q breakfast Roney Jaffe, MD   200 mg at 11/13/21 0865   diphenhydrAMINE (BENADRYL) 12.5 MG/5ML elixir 25 mg  25 mg Oral Q6H PRN Hosie Poisson, MD       doxercalciferol (HECTOROL) injection 6 mcg  6 mcg Intravenous Q M,W,F-HD Roney Jaffe, MD       famotidine (PEPCID) tablet 20 mg  20 mg Oral Daily Gaylan Gerold, DO   20 mg at 11/13/21 1052   ferric citrate (AURYXIA) tablet 630 mg  630 mg Oral TID WC Noemi Chapel P, DO   630 mg at  11/13/21 1054   heparin injection 1,000 Units  1,000 Units Dialysis PRN Rexene Agent, MD       heparin injection 1,200 Units  20 Units/kg Dialysis PRN Pearson Grippe B, MD       hydrALAZINE (APRESOLINE) injection 10 mg  10 mg Intravenous Q6H PRN Eugenie Filler, MD   10 mg at 11/10/21 1148   labetalol (NORMODYNE) tablet 300 mg  300 mg Oral TID Hosie Poisson, MD   300 mg at 11/13/21 1640   lactulose (CHRONULAC) 10 GM/15ML solution 30 g  30 g Oral BID Hosie Poisson, MD   30 g at 11/10/21 2235   lidocaine (PF) (XYLOCAINE) 1 % injection 5 mL  5 mL Intradermal PRN Rexene Agent, MD       lidocaine-prilocaine (EMLA) cream 1 application  1 application. Topical PRN Rexene Agent, MD       meclizine (ANTIVERT) tablet 25 mg  25 mg Oral TID PRN Hosie Poisson, MD   25 mg at 11/13/21 1732   metoCLOPramide (REGLAN) tablet 5 mg  5 mg Oral Q6H PRN Hosie Poisson, MD   5 mg at 11/10/21 2256   mycophenolate (MYFORTIC) EC tablet 540 mg  540 mg Oral BID Hosie Poisson, MD   540 mg at 11/13/21 1052   naloxone (NARCAN) injection 0.4 mg  0.4 mg Intravenous PRN Kommor, [place], MD   0.4 mg at 11/06/21 1011   ondansetron (ZOFRAN) tablet 4 mg  4 mg Oral Q6H PRN Hosie Poisson, MD       Or   ondansetron (ZOFRAN) injection 4 mg  4 mg Intravenous Q6H PRN Hosie Poisson, MD   4 mg at 11/13/21 1051   oxyCODONE (Oxy IR/ROXICODONE) immediate release tablet 5 mg  5 mg Oral Q8H PRN Hosie Poisson,  MD   5 mg at 11/13/21 1641   pentafluoroprop-tetrafluoroeth (GEBAUERS) aerosol 1 application  1 application. Topical PRN Rexene Agent, MD       predniSONE (DELTASONE) tablet 15 mg  15 mg Oral Q breakfast Gaylan Gerold, DO   15 mg at 11/13/21 4259   prochlorperazine (COMPAZINE) injection 10 mg  10 mg Intravenous Q6H PRN Hosie Poisson, MD   10 mg at 11/12/21 1430   zinc sulfate capsule 220 mg  220 mg Oral Daily Hosie Poisson, MD   220 mg at 11/13/21 1054    PHYSICAL EXAMINATION:   Vitals:   11/13/21 1618 11/13/21 2024  BP: 129/68 132/64  Pulse: 81 74  Resp: 16 18  Temp: (!) 97.4 F (36.3 C) 97.8 F (36.6 C)  SpO2: 98% 96%   Filed Weights   11/06/21 0221 11/07/21 0300 11/07/21 0700  Weight: 135 lb (61.2 kg) 124 lb 9 oz (56.5 kg) 117 lb 11.6 oz (53.4 kg)    GENERAL:alert, no distress and comfortable SKIN: skin color, texture, turgor are normal, no rashes or significant lesions, no ecchymosis or petechia EYES: normal, conjunctiva are pink and non-injected, sclera clear Musculoskeletal:no cyanosis of digits and no clubbing  PSYCH: alert & oriented x 3 with fluent speech NEURO: no focal motor/sensory deficits  LABORATORY DATA:  I have reviewed the data as listed Lab Results  Component Value Date   WBC 3.4 (L) 11/12/2021   HGB 11.3 (L) 11/12/2021   HCT 33.3 (L) 11/12/2021   MCV 87.6 11/12/2021   PLT 67 (L) 11/12/2021   Recent Labs    11/05/21 2317 11/05/21 2322 11/07/21 5638 11/08/21 0225 11/09/21 0109 11/10/21 7564 11/11/21 0225 11/12/21 0315  11/13/21 0037  NA 139   < > 138   < > 142   < > 139 137 137  K 5.9*   < > 3.9   < > 5.0   < > 3.4* 3.7 3.3*  CL 91*   < > 95*   < > 105   < > 101 97* 99  CO2 28   < > 25   < > 13*   < > 21* 20* 24  GLUCOSE 79   < > 85   < > 166*   < > 108* 100* 89  BUN 65*   < > 42*   < > 96*   < > 68* 94* 49*  CREATININE 9.65*   < > 6.37*   < > 10.00*   < > 6.74* 8.68* 6.09*  CALCIUM 8.6*   < > 8.9   < > 7.3*   < > 8.4* 8.7* 8.6*   GFRNONAA 5*   < > 8*   < > 5*   < > 7* 5* 8*  PROT 6.6  --  8.2*  --  7.0  --   --   --   --   ALBUMIN 3.4*   < > 4.0   < > 2.9*   2.9*   < > 2.9* 2.8* 2.9*  AST 27  --  28  --  44*  --   --   --   --   ALT 15  --  16  --  25  --   --   --   --   ALKPHOS 71  --  73  --  58  --   --   --   --   BILITOT 0.8  --  0.4  --  0.5  --   --   --   --   BILIDIR  --   --   --   --  0.1  --   --   --   --   IBILI  --   --   --   --  0.4  --   --   --   --    < > = values in this interval not displayed.    RADIOGRAPHIC STUDIES: I have personally reviewed the radiological images as listed and agreed with the findings in the report. DG Abd 1 View  Result Date: 11/07/2021 CLINICAL DATA:  Encounter for NG tube placement EXAM: ABDOMEN - 1 VIEW COMPARISON:  None. FINDINGS: NG tube extends the stomach. Side port below the GE junction. Normal bowel-gas pattern. IMPRESSION: NG tube in stomach. Electronically Signed   By: Suzy Bouchard M.D.   On: 11/07/2021 13:32   CT HEAD WO CONTRAST (5MM)  Result Date: 11/12/2021 CLINICAL DATA:  New onset of dizziness, altered mental status, encephalopathy, single episode of vomiting, history stroke, hypertension, end-stage renal disease EXAM: CT HEAD WITHOUT CONTRAST TECHNIQUE: Contiguous axial images were obtained from the base of the skull through the vertex without intravenous contrast. RADIATION DOSE REDUCTION: This exam was performed according to the departmental dose-optimization program which includes automated exposure control, adjustment of the mA and/or kV according to patient size and/or use of iterative reconstruction technique. COMPARISON:  11/11/2021 FINDINGS: Brain: Generalized atrophy. Normal ventricular morphology. No midline shift or mass effect. Small BILATERAL medial cerebellar infarcts. Small frontal subdural hematomas are seen bilaterally, greater on LEFT at 9 mm thick stable but now crossing the midline to RIGHT 5 mm  thick and slightly increased along  anterior falx since previous exam. No midline shift or mass effect. No new areas of infarction or mass. No intraparenchymal hemorrhage. Vascular: Atherosclerotic calcification of internal carotid and vertebral arteries at skull base Skull: Intact Sinuses/Orbits: Clear Other: N/A IMPRESSION: Small BILATERAL frontal subdural hematomas, stable on LEFT at 9 mm greatest thickness but slightly increased at the falx and now crossing the midline to the RIGHT frontal region measuring 5 mm thick. No mass effect. Small LEFT cerebellar infarct. No new infarcts identified. Electronically Signed   By: Lavonia Dana M.D.   On: 11/12/2021 18:42   CT HEAD WO CONTRAST (5MM)  Result Date: 11/11/2021 CLINICAL DATA:  Follow-up examination for subdural hematoma. EXAM: CT HEAD WITHOUT CONTRAST TECHNIQUE: Contiguous axial images were obtained from the base of the skull through the vertex without intravenous contrast. RADIATION DOSE REDUCTION: This exam was performed according to the departmental dose-optimization program which includes automated exposure control, adjustment of the mA and/or kV according to patient size and/or use of iterative reconstruction technique. COMPARISON:  Prior CT and MRI from 11/09/2021. FINDINGS: Brain: Previously identified extra-axial hemorrhage overlying the anterior left frontal convexity again seen, not significantly changed in size or morphology measuring up to 9 mm in maximal thickness. Slight extension along the adjacent anterior falx, suggesting that this is subdural in nature. No significant regional mass effect or midline shift. No other new intracranial hemorrhage. Normal expected interval evolution of previously identified cerebellar infarcts. No other new large vessel territory infarct. No hydrocephalus. Vascular: Age advanced atherosclerotic calcifications about the skull base and visualized head. Skull: No scalp soft tissue abnormality. Calvarium intact without abnormality. Sinuses/Orbits:  Globes and orbital soft tissues demonstrate no acute finding. Paranasal sinuses and mastoid air cells remain clear. Other: None. IMPRESSION: 1. No significant interval change in size and morphology of extra-axial hemorrhage overlying the anterior left frontal convexity, measuring up to 9 mm in maximal thickness. No significant regional mass effect or midline shift. No new hemorrhage. 2. Normal expected interval evolution of previously identified cerebellar infarcts. 3. No other new acute intracranial abnormality. Electronically Signed   By: Jeannine Boga M.D.   On: 11/11/2021 02:50   CT HEAD WO CONTRAST (5MM)  Result Date: 11/10/2021 CLINICAL DATA:  Follow-up intracranial hemorrhage EXAM: CT HEAD WITHOUT CONTRAST TECHNIQUE: Contiguous axial images were obtained from the base of the skull through the vertex without intravenous contrast. RADIATION DOSE REDUCTION: This exam was performed according to the departmental dose-optimization program which includes automated exposure control, adjustment of the mA and/or kV according to patient size and/or use of iterative reconstruction technique. COMPARISON:  Brain MRI from earlier today FINDINGS: Brain: The extra-axial anterior left frontal abnormality is confirmed as intracranial hemorrhage in measures up to 8 mm in maximal thickness with mild local mass effect. Agree with the subdural location. Known bilateral para median cerebellar infarcts. No hydrocephalus or shift. Vascular: Premature arterial calcification. Skull: Normal. Negative for fracture or focal lesion. Sinuses/Orbits: No acute finding. IMPRESSION: 1. Confirmed anterior left frontal subdural hemorrhage measuring up to 8 mm in thickness. 2. Known small cerebellar infarcts. Electronically Signed   By: Jorje Guild M.D.   On: 11/10/2021 04:03   CT HEAD WO CONTRAST (5MM)  Result Date: 11/07/2021 CLINICAL DATA:  Altered mental status of uncertain etiology EXAM: CT HEAD WITHOUT CONTRAST TECHNIQUE:  Contiguous axial images were obtained from the base of the skull through the vertex without intravenous contrast. RADIATION DOSE REDUCTION: This exam was performed according to  the departmental dose-optimization program which includes automated exposure control, adjustment of the mA and/or kV according to patient size and/or use of iterative reconstruction technique. COMPARISON:  11/06/2021 FINDINGS: Brain: Generalized atrophy. Normal ventricular morphology. No midline shift or mass effect. 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 Old infarct medial LEFT cerebellar hemisphere. No intracranial hemorrhage, mass lesion, or evidence of acute infarction. No extra-axial fluid collections. Vascular: Atherosclerotic calcification of internal carotid and vertebral arteries at skull base Skull: Intact Sinuses/Orbits: Clear. Nasogastric tube traverses LEFT nasal passages. Other: N/A IMPRESSION: Generalized atrophy with old infarcts RIGHT pons and LEFT cerebellum. No acute intracranial abnormalities. Electronically Signed   By: Lavonia Dana M.D.   On: 11/07/2021 17:01   CT Head Wo Contrast  Result Date: 11/06/2021 CLINICAL DATA:  Altered mental status EXAM: CT HEAD WITHOUT CONTRAST TECHNIQUE: Contiguous axial images were obtained from the base of the skull through the vertex without intravenous contrast. RADIATION DOSE REDUCTION: This exam was performed according to the departmental dose-optimization program which includes automated exposure control, adjustment of the mA and/or kV according to patient size and/or use of iterative reconstruction technique. COMPARISON:  11/04/2018 FINDINGS: Brain: No evidence of acute infarction, hemorrhage, hydrocephalus, extra-axial collection or mass lesion/mass effect. 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 Skull: Normal. Negative for fracture or focal lesion. Sinuses/Orbits: No acute finding. Other: None. IMPRESSION: No  acute intracranial abnormality noted. Electronically Signed   By: Inez Catalina M.D.   On: 11/06/2021 01:57   MR ANGIO HEAD WO CONTRAST  Result Date: 11/11/2021 CLINICAL DATA:  Follow-up examination for acute stroke. EXAM: MRA HEAD WITHOUT CONTRAST TECHNIQUE: Angiographic images of the Circle of Willis were acquired using MRA technique without intravenous contrast. COMPARISON:  Prior brain MRI from 11/09/2021. FINDINGS: Anterior circulation: Examination mildly degraded by motion. Visualized distal cervical segments of both internal carotid arteries widely patent with antegrade flow. Petrous segments widely patent bilaterally. Atheromatous irregularity within the carotid siphons without hemodynamically significant stenosis. A1 segments widely patent. Normal anterior communicating artery complex. Mild atheromatous irregularity within the ACAs without significant stenosis. No M1 stenosis or occlusion. Normal right MCA bifurcation. Left MCA trifurcates. No proximal MCA branch occlusion. Distal MCA branches well perfused and symmetric, although demonstrates small vessel atheromatous irregularity. Posterior circulation: Visualized V4 segments widely patent without stenosis. Left vertebral artery slightly dominant. Right PICA patent at its origin. Left PICA origin not visualized. Basilar patent to its distal aspect without stenosis. Superior cerebellar arteries patent bilaterally. Both PCAs primarily supplied via the basilar well perfused or distal aspects. Anatomic variants: None significant. Other: No intracranial aneurysm. IMPRESSION: 1. Negative MRA for large vessel occlusion or other acute finding. 2. Age advanced intracranial small vessel atheromatous disease, but no hemodynamically significant or correctable stenosis. Electronically Signed   By: Jeannine Boga M.D.   On: 11/11/2021 02:58   MR BRAIN WO CONTRAST  Result Date: 11/09/2021 EXAM: MRI HEAD WITHOUT CONTRAST TECHNIQUE: Multiplanar, multiecho  pulse sequences of the brain and surrounding structures were obtained without intravenous contrast. COMPARISON:  None. FINDINGS: Motion limited study. Also, incomplete study due to patient intolerance. Axial T1 and coronal T2 sequences were not performed. Brain: Small foci of restricted diffusion in the posterior paramidline cerebellum, concerning for acute infarcts (series 3, image 15). Additional punctate focus of restricted diffusion in the splenium of the corpus callosum (series 3, image 29). Along the left frontal convexity there is an approximately 1.0 x 2.5 cm lobulated area of T2/FLAIR  hyperintensity with associated susceptibility artifact, which may be extra-axial. This area is T1 isointense. No significant mass effect. No midline shift. No hydrocephalus. Small remote infarcts in the right pons, inferior cerebellum, and right basal ganglia. Vascular: Major arterial flow voids are maintained at the skull base. Skull and upper cervical spine: Normal marrow signal. Sinuses/Orbits: Clear sinuses.  Unremarkable orbits. Other: Small bilateral mastoid effusions. IMPRESSION: Incomplete and motion limited study, as detailed above. 1. Along the left frontal convexity there is a 1.0 x 2.5 cm area of T2/FLAIR hyperintensity and susceptibility artifact, which appears to be extra-axial. This finding is most suspicious for recent hemorrhage, probably subdural in location. No significant mass effect. A repeat CT head could assess for hyperdense blood products in this region. If the CT head does not confirm hemorrhage, then follow-up postcontrast MRI imaging is recommended to exclude a mass (such as a meningioma). 2. Small acute infarcts in the posterior paramidline cerebellum bilaterally. 3. Additional punctate focus of restricted diffusion in the splenium of the corpus callosum, which likely represents a cytotoxic lesion of the corpus callosum ([place]). This has a broad differential that includes the sequela of seizures,  metabolic disturbance, infection, or sequela of drugs/toxins. 4. Small remote infarcts in the right pons, inferior cerebellum, and right basal ganglia. These results will be called to the ordering clinician or representative by the Radiologist Assistant, and communication documented in the PACS or [employer]. Electronically Signed   By: Margaretha Sheffield M.D.   On: 11/09/2021 18:18   DG Pelvis Portable  Result Date: 11/08/2021 CLINICAL DATA:  Foreign body screening. EXAM: PORTABLE PELVIS 1-2 VIEWS COMPARISON:  Abdominal x-ray 11/07/2021. FINDINGS: There is no evidence for radiopaque foreign body. There are extensive vascular calcifications in the pelvis and lower extremities. No acute fractures. Catheter overlies the right mid abdomen. IMPRESSION: 1. No radiopaque foreign body identified. 2. Extensive vascular calcifications. Electronically Signed   By: Ronney Asters M.D.   On: 11/08/2021 17:52   DG CHEST PORT 1 VIEW  Result Date: 11/09/2021 CLINICAL DATA:  Shortness of breath. EXAM: PORTABLE CHEST 1 VIEW COMPARISON:  November 07, 2021. FINDINGS: Stable cardiomegaly. Stable bibasilar opacities are noted. Bony thorax is unremarkable. IMPRESSION: Stable bibasilar opacities are noted concerning for possible edema, atelectasis or possibly infiltrates. Electronically Signed   By: Marijo Conception M.D.   On: 11/09/2021 08:09   DG Chest Port 1 View  Result Date: 11/07/2021 CLINICAL DATA:  NG tube placement EXAM: PORTABLE CHEST 1 VIEW COMPARISON:  11/07/2021 at 0834 hours FINDINGS: Interval placement of NG tube extending into the stomach. Stable cardiomegaly. Diffuse bilateral interstitial opacities, unchanged. No pleural effusion or pneumothorax. IMPRESSION: 1. Interval placement of NG tube extending into the stomach. 2. Unchanged diffuse bilateral interstitial opacities, edema versus multifocal infection. Electronically Signed   By: Davina Poke D.O.   On: 11/07/2021 13:24   DG CHEST PORT 1 VIEW  Result  Date: 11/07/2021 CLINICAL DATA:  COVID-19, fever, altered mental status EXAM: PORTABLE CHEST 1 VIEW COMPARISON:  Portable exam 0838 hours compared to 11/06/2021 FINDINGS: Enlargement of cardiac silhouette. Mediastinal contours and pulmonary vascularity normal. Hazy BILATERAL airspace infiltrates RIGHT greater than LEFT consistent with pneumonia and history of COVID-19. No pleural effusion or pneumothorax. Osseous structures unremarkable. IMPRESSION: Hazy BILATERAL pulmonary infiltrates consistent with pneumonia and COVID-19. Electronically Signed   By: Lavonia Dana M.D.   On: 11/07/2021 08:50   DG Chest Portable 1 View  Result Date: 11/06/2021 CLINICAL DATA:  Difficulty breathing EXAM: PORTABLE  CHEST 1 VIEW COMPARISON:  12/08/2020 FINDINGS: Cardiac shadow is enlarged but stable. Previously seen dialysis catheter has been removed in the interval. Generalize pulmonary edema is noted likely related to volume overload. No focal infiltrate or effusion is seen. No bony abnormality is noted. IMPRESSION: Mild pulmonary edema likely related to volume overload. Electronically Signed   By: Inez Catalina M.D.   On: 11/06/2021 00:35   EEG adult  Result Date: 11/07/2021 Lora Havens, MD     11/07/2021  6:04 PM Patient Name: [name] [name] MRN: [id number] Epilepsy Attending: Lora Havens Referring Physician/Provider: Amie Portland, MD Date: 11/07/2021 Duration: 21.57 mins Patient history: 41 year old who has had a few days worth of feeling unwell and was brought into the hospital for altered mental status. EEG to evaluate for seizure Level of alertness:  lethargic AEDs during EEG study: None Technical aspects: This EEG study was done with scalp electrodes positioned according to the 10-20 International system of electrode placement. Electrical activity was acquired at a sampling rate of '500Hz'$  and reviewed with a high frequency filter of '70Hz'$  and a low frequency filter of '1Hz'$ . EEG data were recorded continuously and  digitally stored. Description: EEG showed continuous generalized 3 to 5 Hz theta-delta slowing. Generalized periodic discharges with triphasic morphology were also noted at 2.5 Hz. Hyperventilation and photic stimulation were not performed.   ABNORMALITY - Periodic discharges with triphasic morphology, generalized ( GPDs) - Continuous slow, generalized IMPRESSION: This study  showed generalized periodic discharges with triphasic morphology at 2.5 Hz. This morphology of discharges is commonly due to toxic-metabolic causes like hyperammonemia, uremia. However, the frequency of discharges at 2.'5hz'$  is on the ictal-interitctal continuum. Dicussed with neurologist Dr Rory Percy to consider ativan challenge and long term monitoring. Additionally, this study is suggestive of severe diffuse encephalopathy, nonspecific etiology. No definite seizures were seen throughout the recording. Priyanka Barbra Sarks   Overnight EEG with video  Result Date: 11/08/2021 Lora Havens, MD     11/09/2021  8:50 AM Patient Name: Chen Holzman MRN: [id number] Epilepsy Attending: Lora Havens Referring Physician/Provider: Amie Portland, MD Duration: 11/07/2021 2211 to 11/08/2021 2211  Patient history: 41 year old who has had a few days worth of feeling unwell and was brought into the hospital for altered mental status. EEG to evaluate for seizure  Level of alertness:  lethargic  AEDs during EEG study: None  Technical aspects: This EEG study was done with scalp electrodes positioned according to the 10-20 International system of electrode placement. Electrical activity was acquired at a sampling rate of '500Hz'$  and reviewed with a high frequency filter of '70Hz'$  and a low frequency filter of '1Hz'$ . EEG data were recorded continuously and digitally stored.  Description: At the beginning of the study, EEG showed generalized periodic discharges with triphasic morphology at 2.5 Hz.  IV Ativan 2 mg was administered at 1840 on 11/07/2021 after which EEG showed  continuous generalized 3 to 5 Hz theta-delta slowing. Intermittent generalized periodic discharges with triphasic morphology were also noted at 1.5-2 Hz, more prominent when awake/stimulated. Hyperventilation and photic stimulation were not performed.    ABNORMALITY - Periodic discharges with triphasic morphology, generalized ( GPDs) - Continuous slow, generalized  IMPRESSION: At the beginning of the study, EEG showed generalized periodic discharges with triphasic morphology at 2.5 Hz which was on the ictal-interictal continuum.  IV Ativan 2 mg was administered at [place] on 11/07/2021 after which EEG improved. This study then showed generalized periodic discharges with triphasic morphology at 1.5-2  Hz which were more prominent when patient was awake/stimulated.  This EEG pattern is most likely due to toxic-metabolic causes like hyperammonemia, uremia, cefepime toxicity.  Additionally, this study is suggestive of severe diffuse encephalopathy, nonspecific etiology. No definite seizures were seen throughout the recording. Lora Havens   ECHOCARDIOGRAM COMPLETE  Result Date: 11/11/2021    ECHOCARDIOGRAM REPORT   Patient Name:   Kalana SONNI BARSE Date of Exam: 11/11/2021 Medical Rec #:  [id number]          Height:       64.0 in Accession #:    [id number]         Weight:       117.7 lb Date of Birth:  Aug 13, 1981           BSA:          1.562 m Patient Age:    60 years           BP:           114/60 mmHg Patient Gender: F                  HR:           90 bpm. Exam Location:  Inpatient Procedure: 2D Echo, Cardiac Doppler and Color Doppler Indications:    Stroke  History:        Patient has no prior history of Echocardiogram examinations.                 Risk Factors:Hypertension and Diabetes.  Sonographer:    Jyl Heinz Referring Phys: [id number] ASHISH ARORA IMPRESSIONS  1. Left ventricular ejection fraction, by estimation, is 55 to 60%. The left ventricle has normal function. The left ventricle has no regional wall  motion abnormalities. There is severe concentric left ventricular hypertrophy. Left ventricular diastolic  parameters are consistent with Grade I diastolic dysfunction (impaired relaxation).  2. Right ventricular systolic function is normal. The right ventricular size is normal. Tricuspid regurgitation signal is inadequate for assessing PA pressure.  3. Moderate pericardial effusion. The pericardial effusion is circumferential. There is no evidence of cardiac tamponade.  4. The mitral valve is abnormal. Trivial mitral valve regurgitation. No evidence of mitral stenosis.  5. The aortic valve is tricuspid. There is mild calcification of the aortic valve. Aortic valve regurgitation is mild. No aortic stenosis is present.  6. The inferior vena cava is normal in size with greater than 50% respiratory variability, suggesting right atrial pressure of 3 mmHg. Comparison(s): No prior Echocardiogram. Hypertrophy, effusion, and mitral valve are abnormal for age but may be consistent with ESRD. FINDINGS  Left Ventricle: Left ventricular ejection fraction, by estimation, is 55 to 60%. The left ventricle has normal function. The left ventricle has no regional wall motion abnormalities. The left ventricular internal cavity size was normal in size. There is  severe concentric left ventricular hypertrophy. Left ventricular diastolic parameters are consistent with Grade I diastolic dysfunction (impaired relaxation). Right Ventricle: The right ventricular size is normal. No increase in right ventricular wall thickness. Right ventricular systolic function is normal. Tricuspid regurgitation signal is inadequate for assessing PA pressure. Left Atrium: Left atrial size was normal in size. Right Atrium: Right atrial size was normal in size. Pericardium: A moderately sized pericardial effusion is present. The pericardial effusion is circumferential. There is no evidence of cardiac tamponade. Mitral Valve: The mitral valve is abnormal. Mild  mitral annular calcification. Trivial mitral valve regurgitation. No evidence of mitral valve stenosis. Tricuspid  Valve: The tricuspid valve is normal in structure. Tricuspid valve regurgitation is not demonstrated. No evidence of tricuspid stenosis. Aortic Valve: The aortic valve is tricuspid. There is mild calcification of the aortic valve. Aortic valve regurgitation is mild. No aortic stenosis is present. Aortic valve peak gradient measures 11.3 mmHg. Pulmonic Valve: The pulmonic valve was normal in structure. Pulmonic valve regurgitation is not visualized. No evidence of pulmonic stenosis. Aorta: The aortic root, ascending aorta and aortic arch are all structurally normal, with no evidence of dilitation or obstruction. Venous: The inferior vena cava is normal in size with greater than 50% respiratory variability, suggesting right atrial pressure of 3 mmHg. IAS/Shunts: No atrial level shunt detected by color flow Doppler.  LEFT VENTRICLE PLAX 2D LVIDd:         3.80 cm     Diastology LVIDs:         2.50 cm     LV e' medial:    4.57 cm/s LV PW:         1.30 cm     LV E/e' medial:  13.5 LV IVS:        1.00 cm     LV e' lateral:   5.33 cm/s LVOT diam:     1.80 cm     LV E/e' lateral: 11.6 LV SV:         44 LV SV Index:   28 LVOT Area:     2.54 cm  LV Volumes (MOD) LV vol d, MOD A2C: 70.0 ml LV vol d, MOD A4C: 78.8 ml LV vol s, MOD A2C: 30.5 ml LV vol s, MOD A4C: 34.1 ml LV SV MOD A2C:     39.5 ml LV SV MOD A4C:     78.8 ml LV SV MOD BP:      42.7 ml RIGHT VENTRICLE             IVC RV Basal diam:  2.70 cm     IVC diam: 1.20 cm RV Mid diam:    1.70 cm RV S prime:     10.80 cm/s TAPSE (M-mode): 1.6 cm LEFT ATRIUM             Index        RIGHT ATRIUM           Index LA diam:        2.90 cm 1.86 cm/m   RA Area:     11.00 cm LA Vol (A2C):   27.8 ml 17.80 ml/m  RA Volume:   23.70 ml  15.17 ml/m LA Vol (A4C):   27.5 ml 17.61 ml/m LA Biplane Vol: 28.5 ml 18.25 ml/m  AORTIC VALVE AV Area (Vmax): 1.89 cm AV Vmax:         168.00 cm/s AV Peak Grad:   11.3 mmHg LVOT Vmax:      125.00 cm/s LVOT Vmean:     85.300 cm/s LVOT VTI:       0.172 m  AORTA Ao Root diam: 2.50 cm Ao Asc diam:  2.70 cm MITRAL VALVE MV Area (PHT): 3.61 cm    SHUNTS MV Decel Time: 210 msec    Systemic VTI:  0.17 m MV E velocity: 61.80 cm/s  Systemic Diam: 1.80 cm MV A velocity: 76.00 cm/s MV E/A ratio:  0.81 Rudean Haskell MD Electronically signed by Rudean Haskell MD Signature Date/Time: 11/11/2021/11:13:28 AM    Final    US Abdomen Limited RUQ (LIVER/GB)  Result Date: 11/08/2021 CLINICAL DATA:  Increased ammonia EXAM: ULTRASOUND ABDOMEN LIMITED RIGHT UPPER QUADRANT COMPARISON:  CT 12/20/2018 FINDINGS: Gallbladder: Small layering stones in the gallbladder. Normal wall thickness. Negative sonographic Murphy. Common bile duct: Diameter: 2.3 mm Liver: No focal lesion identified. Within normal limits in parenchymal echogenicity. Portal vein is patent on color Doppler imaging with normal direction of blood flow towards the liver. Other: Atrophic echogenic native right kidney partially visualized. IMPRESSION: 1. Small gallstones without sonographic evidence for acute gallbladder disease 2. Otherwise negative examination Electronically Signed   By: Donavan Foil M.D.   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 Given her multiple high risk for ischemic stroke, I do not feel strongly she needs hypercoagulopathy work-up, or we can pursue it in 3 to 6 months after his acute stroke event.  Due to her subdural hemorrhage, she is not a candidate for antiplatelet or anticoagulation. -I am not sure if her subdural hemorrhage related to some head injury, patient does not remember, she was altered when she was found in the home due to narcotics overdose and other metabolic encephalopathy. -She has baseline thrombocytopenia, probably secondary to ESRD, and chronic immunosuppressant.  It is worse since admission, likely secondary to COVID and infection. I will check immature platelet. No need medical treatment for this.  -Her anemia is likely secondary to end-stage renal disease, she is unable.  Ferritin level is very high.  I will check B12, weight, reticulocyte count tomorrow morning.  -I have reviewed her peripheral blood smear, which showed polychromasia, moderate thrombocytopenia with giant platelet, otherwise unremarkable. -I will f/u as needed, please call me if any additional questions arise.   All questions were answered. The patient knows to call the clinic with any problems, questions or concerns.      Truitt Merle, MD 11/13/2021

## 2021-11-14 ENCOUNTER — Inpatient Hospital Stay (HOSPITAL_COMMUNITY): Payer: Medicare Other

## 2021-11-14 LAB — RENAL FUNCTION PANEL
Albumin: 2.7 g/dL — ABNORMAL LOW (ref 3.5–5.0)
Anion gap: 19 — ABNORMAL HIGH (ref 5–15)
BUN: 70 mg/dL — ABNORMAL HIGH (ref 6–20)
CO2: 19 mmol/L — ABNORMAL LOW (ref 22–32)
Calcium: 8.3 mg/dL — ABNORMAL LOW (ref 8.9–10.3)
Chloride: 100 mmol/L (ref 98–111)
Creatinine, Ser: 8.26 mg/dL — ABNORMAL HIGH (ref 0.44–1.00)
GFR, Estimated: 6 mL/min — ABNORMAL LOW (ref >60)
Glucose, Bld: 77 mg/dL (ref 70–99)
Phosphorus: 7.3 mg/dL — ABNORMAL HIGH (ref 2.5–4.6)
Potassium: 3.7 mmol/L (ref 3.5–5.1)
Sodium: 138 mmol/L (ref 135–145)

## 2021-11-14 LAB — CBC WITH DIFFERENTIAL/PLATELET
Abs Immature Granulocytes: 0.03 10*3/uL (ref 0.00–0.07)
Basophils Absolute: 0 10*3/uL (ref 0.0–0.1)
Basophils Relative: 0 %
Eosinophils Absolute: 0 10*3/uL (ref 0.0–0.5)
Eosinophils Relative: 1 %
HCT: 29.3 % — ABNORMAL LOW (ref 36.0–46.0)
Hemoglobin: 9.8 g/dL — ABNORMAL LOW (ref 12.0–15.0)
Immature Granulocytes: 1 %
Lymphocytes Relative: 18 %
Lymphs Abs: 0.7 10*3/uL (ref 0.7–4.0)
MCH: 30 pg (ref 26.0–34.0)
MCHC: 33.4 g/dL (ref 30.0–36.0)
MCV: 89.6 fL (ref 80.0–100.0)
Monocytes Absolute: 0.3 10*3/uL (ref 0.1–1.0)
Monocytes Relative: 7 %
Neutro Abs: 2.8 10*3/uL (ref 1.7–7.7)
Neutrophils Relative %: 73 %
Platelets: 91 10*3/uL — ABNORMAL LOW (ref 150–400)
RBC: 3.27 MIL/uL — ABNORMAL LOW (ref 3.87–5.11)
RDW: 12.4 % (ref 11.5–15.5)
WBC: 3.8 10*3/uL — ABNORMAL LOW (ref 4.0–10.5)
nRBC: 0 % (ref 0.0–0.2)

## 2021-11-14 LAB — RETICULOCYTES
Immature Retic Fract: 11.7 % (ref 2.3–15.9)
RBC.: 3.33 MIL/uL — ABNORMAL LOW (ref 3.87–5.11)
Retic Count, Absolute: 23.3 10*3/uL (ref 19.0–186.0)
Retic Ct Pct: 0.7 % (ref 0.4–3.1)

## 2021-11-14 LAB — VITAMIN B12: Vitamin B-12: 1072 pg/mL — ABNORMAL HIGH (ref 180–914)

## 2021-11-14 LAB — IMMATURE PLATELET FRACTION: Immature Platelet Fraction: 9.2 % — ABNORMAL HIGH (ref 1.2–8.6)

## 2021-11-14 LAB — FOLATE: Folate: 17.3 ng/mL (ref >5.9)

## 2021-11-14 NOTE — Progress Notes (Signed)
Occupational Therapy Treatment ?Patient Details ?Name: [name] [name] ?MRN: [id number] ?DOB: 16-Apr-1981 ?Today's Date: 11/14/2021 ? ? ?History of present illness 41 y.o. female presented via EMS 11/05/20 with AMS. Pt  COVID positive, possible narcotics overdose, given Narcan, found to be hyperammonemia EEG showing diffuse severe encephalopathy. 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  ?OT comments ? Pt currently self limiting with increased dizziness noted at supine and with transitional movements.  She rates dizziness at a 7/10 but no noted nystagmus seen.  She only tolerated sitting EOB for 1-2 mins at a time and standing intervals X 2 for less than 1 minute.  She would not attempt walking to the bathroom or completion of any functional mobility.  Feel overall she will not be safe to return home alone at current level and will not tolerate AIR level therapy as she has trouble participating currently for 30 mins with OT.  Will continue to follow in acute care.    ? ?Recommendations for follow up therapy are one component of a multi-disciplinary discharge planning process, led by the attending physician.  Recommendations may be updated based on patient status, additional functional criteria and insurance authorization. ?   ?Follow Up Recommendations ? Skilled nursing-short term rehab (<3 hours/day)  ?  ?Assistance Recommended at Discharge Frequent or constant Supervision/Assistance  ?Patient can return home with the following ? A little help with walking and/or transfers;A little help with bathing/dressing/bathroom;Assistance with  cooking/housework;Direct supervision/assist for financial management;Direct supervision/assist for medications management ?  ?Equipment Recommendations ? Tub/shower seat;BSC/3in1  ?  ?Recommendations for Other Services   ? ?  ?Precautions / Restrictions Precautions ?Precautions: Fall ?Restrictions ?Weight Bearing Restrictions: No  ? ? ?  ? ?Mobility Bed Mobility ?Overal bed mobility: Needs Assistance ?Bed Mobility: Supine to Sit, Sit to Supine ?  ?  ?Supine to sit: Supervision ?Sit to supine: Supervision ?  ?General bed mobility comments: Pt able to transfer to EOB and to supine but would not maintain sitting up for more than 1-2 mins. ?  ? ?Transfers ?Overall transfer level: Needs assistance ?Equipment used: Rolling walker (2 wheels) ?Transfers: Sit to/from Stand ?Sit to Stand: Min assist ?  ?  ?  ?  ?  ?General transfer comment: Min guard for standing with pt exhibiting flexed posture.  Still no nystagmus noted in standing but pt reporting having to sit after around one min secondary to dizziness. ?  ?  ?Balance Overall balance assessment: Needs assistance ?Sitting-balance support: No upper extremity supported, Feet supported ?Sitting balance-Leahy Scale: Good ?  ?  ?Standing balance support: Bilateral upper extremity supported ?Standing balance-Leahy Scale: Poor ?Standing balance comment: Pt needs UE support on the RW while standing in order to maintain balance. ?  ?  ?  ?  ?  ?  ?  ?  ?  ?  ?  ?   ? ?ADL either performed or assessed with clinical judgement  ? ?ADL Overall ADL's : Needs assistance/impaired ?  ?  ?Grooming: Wash/dry hands;Wash/dry face;Set up;Sitting ?  ?  ?  ?  ?  ?  ?  ?Lower Body Dressing: Supervision/safety;Sitting/lateral leans ?Lower Body Dressing Details (indicate cue type and  reason): donning gripper socks only ?Toilet Transfer: Minimal assistance;Rollator (4 wheels);Min guard ?Toilet Transfer Details (indicate cue type and reason): simulated stand pivot with the RW ?  ?  ?  ?  ?  ?General  ADL Comments: Pt was able to transfer to the EOB with supervision but needed max encouragement to maintain secondary to reported dizziness and headache.  She would not sit up longer than approximately two mins before laying back on the bed.  BP was taken in sitting at 142/74 and then in standing at 130/63.  She would stand for less than one minute before stating that she needed to sit back down.  No noted nystagmus at rest, however pt reporting 7/10 dizziness.  Would not actively participate in vestibular testing except for visual tracking which was jerky. ?  ? ?Extremity/Trunk Assessment   ?  ?  ?  ?  ?  ? ? ?Cognition Arousal/Alertness: Awake/alert ?Behavior During Therapy: Flat affect ?Overall Cognitive Status: Impaired/Different from baseline ?Area of Impairment: Safety/judgement, Awareness, Attention ?  ?  ?  ?  ?  ?  ?  ?  ?Orientation Level: Time, Disoriented to ?Current Attention Level: Sustained (decreased sustained attention to tasks overall secondary to report of pain and dizziness) ?Memory: Decreased short-term memory ?Following Commands: Follows one step commands with increased time ?Safety/Judgement: Decreased awareness of deficits ?Awareness: Intellectual ?Problem Solving: Slow processing, Requires verbal cues ?  ?  ?  ?   ?   ?   ?General Comments    ? ? ?Pertinent Vitals/ Pain       Pain Assessment ?Pain Assessment: Faces ?Faces Pain Scale: Hurts even more ?Pain Location: head rated as worst with generailized pain also reported ?Pain Descriptors / Indicators: Grimacing ? ?Home Living   ?Living Arrangements: Alone ?Available Help at Discharge: Friend(s);Available PRN/intermittently ?Type of Home: Apartment ?Home Access: Level entry ?  ?  ?Home Layout: One level ?  ?  ?Bathroom Shower/Tub: Walk-in shower ?  ?Bathroom Toilet: Standard ?Bathroom Accessibility: Yes ?How Accessible: Accessible via walker ?  ?  ?  ?  ? ?  ?   ? ?Frequency ? Min 2X/week  ? ? ? ? ?  ?Progress Toward Goals ? ?OT Goals(current  goals can now be found in the care plan section) ? Progress towards OT goals: Progressing toward goals ? ?Acute Rehab OT Goals ?Patient Stated Goal: None stated during session ?OT Goal Formulation: With patient ?Time For Goal Achievement: 11/28/21 ?Potential to Achieve Goals: Fair  ?Plan Discharge plan needs to be updated   ? ?   ?AM-PAC OT "6 Clicks" Daily Activity     ?Outcome Measure ? ? Help from another person eating meals?: None ?Help from another person taking care of personal grooming?: A Little ?Help from another person toileting, which includes using toliet, bedpan, or urinal?: A Little ?Help from another person bathing (including washing, rinsing, drying)?: A Little ?Help from another person to put on and taking off regular upper body clothing?: A Little ?Help from another person to put on and taking off regular lower body clothing?: A Little ?6 Click Score: 19 ? ?  ?End of Session Equipment Utilized During Treatment: Gait belt;Rolling walker (2 wheels) ? ?OT Visit Diagnosis: Unsteadiness on feet (R26.81);Muscle weakness (generalized) (M62.81);Dizziness and giddiness (R42) ?  ?Activity Tolerance Other (comment) (Pt limited by dizziness, pain, and decreased self motivation) ?  ?Patient Left in bed;with call bell/phone within reach;with bed alarm set ?  ?Nurse Communication Mobility status ?  ? ?   ? ?  Time: 2458-0998 ?OT Time Calculation (min): 30 min ? ?Charges: OT General Charges ?$OT Visit: 1 Visit ?OT Treatments ?$Self Care/Home Management : 23-37 mins ? ? ?Adabella Stanis OTR/L ?11/14/2021, 2:31 PM ?

## 2021-11-14 NOTE — Plan of Care (Signed)
?  Problem: Clinical Measurements: ?Goal: Diagnostic test results will improve ?Outcome: Progressing ?  ?Problem: Clinical Measurements: ?Goal: Respiratory complications will improve ?Outcome: Progressing ?  ?Problem: Health Behavior/Discharge Planning: ?Goal: Ability to manage health-related needs will improve ?Outcome: Progressing ?  ?

## 2021-11-14 NOTE — PMR Pre-admission (Shared)
PMR Admission Coordinator Pre-Admission Assessment  Patient: [name] [name] is an 41 y.o., female MRN: [id number] DOB: [birth year] Height: '5\' 4"'$  (162.6 cm) Weight:  (UBABLE TP OBTAIN)  Insurance Information HMO: ***    PPO: ***     PCP:      IPA:      80/20:      OTHER:  PRIMARY: U[employer] Medicare      Policy#: [id number]      Subscriber: patient CM Name: ***      Phone#: ***     Fax#: *** Pre-Cert#: ***      Employer: *** Benefits:  Phone #: ***     Name: *** Irene Shipper. Date: ***     Deduct: ***      Out of Pocket Max: ***      Life Max: *** CIR: ***      SNF: *** Outpatient: ***     Co-Pay: *** Home Health: ***      Co-Pay: *** DME: ***     Co-Pay: *** Providers: in-network SECONDARY:       Policy#:     Phone#:   Financial Counselor:      Phone#:   The Actuary for patients in Inpatient Rehabilitation Facilities with attached Privacy Act [place] Records was provided and verbally reviewed with: {CHL IP Patient Family EX:[id number]}  Emergency Contact Information Contact Information     Name Relation Home Work Mobile   [place] Friend [phone]  [phone]       Current Medical History  Patient Admitting Diagnosis: SDH History of Present Illness: *** Complete NIHSS TOTAL: 0  Patient's medical record from [facility] has been reviewed by the rehabilitation admission coordinator and physician.  Past Medical History  Past Medical History:  Diagnosis Date   Anemia    Anxiety    ESRD (end stage renal disease) ([place])    Henmodialysis   Gastroparesis    Hypertension    Narcotic abuse ([place])    Non compliance with medical treatment    Renal disorder 2015   transplant   Vision loss     Has the patient had major surgery during 100 days prior to admission? No  Family History   family history includes Hypertension in her father.  Current Medications  Current Facility-Administered Medications:    0.9 %  sodium  chloride infusion, 100 mL, Intravenous, PRN, Sanford, Ryan B, MD   0.9 %  sodium chloride infusion, 100 mL, Intravenous, PRN, Pearson Grippe B, MD   acetaminophen (TYLENOL) tablet 650 mg, 650 mg, Oral, Q6H PRN, Hosie Poisson, MD, 650 mg at 11/12/21 2252   amLODipine (NORVASC) tablet 10 mg, 10 mg, Oral, Daily, Hosie Poisson, MD, 10 mg at 11/14/21 1111   ascorbic acid (VITAMIN C) tablet 500 mg, 500 mg, Oral, Daily, Karleen Hampshire, Vijaya, MD, 500 mg at 11/14/21 1112   aspirin EC tablet 81 mg, 81 mg, Oral, Daily, Hosie Poisson, MD, 81 mg at 11/14/21 1112   Chlorhexidine Gluconate Cloth 2 % PADS 6 each, 6 each, Topical, Daily, Mansy, Jan A, MD, 6 each at 11/14/21 1113   cholecalciferol (VITAMIN D3) tablet 1,000 Units, 1,000 Units, Oral, Daily, Mansy, Jan A, MD, 1,000 Units at 11/14/21 1113   cloNIDine (CATAPRES - Dosed in mg/24 hr) patch 0.1 mg, 0.1 mg, Transdermal, Weekly, Karleen Hampshire, Vijaya, MD, 0.1 mg at 11/11/21 1637   colchicine tablet 0.6 mg, 0.6 mg, Oral, TID PRN, Hosie Poisson, MD, 0.6 mg at 11/10/21 1047  cycloSPORINE modified (NEORAL) capsule 100 mg, 100 mg, Oral, Q1400, Roney Jaffe, MD, 100 mg at 11/11/21 1456   cycloSPORINE modified (NEORAL) capsule 200 mg, 200 mg, Oral, Q breakfast, Roney Jaffe, MD, 200 mg at 11/14/21 0836   diphenhydrAMINE (BENADRYL) 12.5 MG/5ML elixir 25 mg, 25 mg, Oral, Q6H PRN, Hosie Poisson, MD   doxercalciferol (HECTOROL) injection 6 mcg, 6 mcg, Intravenous, Q M,W,F-HD, Roney Jaffe, MD   famotidine (PEPCID) tablet 20 mg, 20 mg, Oral, Daily, Gaylan Gerold, DO, 20 mg at 11/14/21 1112   ferric citrate (AURYXIA) tablet 630 mg, 630 mg, Oral, TID WC, Clark, Laura P, DO, 630 mg at 11/14/21 0835   heparin injection 1,000 Units, 1,000 Units, Dialysis, PRN, Pearson Grippe B, MD   heparin injection 1,200 Units, 20 Units/kg, Dialysis, PRN, Pearson Grippe B, MD   hydrALAZINE (APRESOLINE) injection 10 mg, 10 mg, Intravenous, Q6H PRN, Eugenie Filler, MD, 10 mg at 11/10/21 1148    labetalol (NORMODYNE) tablet 300 mg, 300 mg, Oral, TID, Hosie Poisson, MD, 300 mg at 11/14/21 1112   lactulose (CHRONULAC) 10 GM/15ML solution 30 g, 30 g, Oral, BID, Hosie Poisson, MD, 30 g at 11/10/21 2235   lidocaine (PF) (XYLOCAINE) 1 % injection 5 mL, 5 mL, Intradermal, PRN, Pearson Grippe B, MD   lidocaine-prilocaine (EMLA) cream 1 application, 1 application., Topical, PRN, Rexene Agent, MD   meclizine (ANTIVERT) tablet 25 mg, 25 mg, Oral, TID PRN, Hosie Poisson, MD, 25 mg at 11/13/21 1732   metoCLOPramide (REGLAN) tablet 5 mg, 5 mg, Oral, Q6H PRN, Hosie Poisson, MD, 5 mg at 11/10/21 2256   mycophenolate (MYFORTIC) EC tablet 540 mg, 540 mg, Oral, BID, Hosie Poisson, MD, 540 mg at 11/14/21 1111   naloxone (NARCAN) injection 0.4 mg, 0.4 mg, Intravenous, PRN, Kommor, Madison, MD, 0.4 mg at 11/06/21 1011   ondansetron (ZOFRAN) tablet 4 mg, 4 mg, Oral, Q6H PRN **OR** ondansetron (ZOFRAN) injection 4 mg, 4 mg, Intravenous, Q6H PRN, Hosie Poisson, MD, 4 mg at 11/13/21 1051   oxyCODONE (Oxy IR/ROXICODONE) immediate release tablet 5 mg, 5 mg, Oral, Q8H PRN, Hosie Poisson, MD, 5 mg at 11/14/21 1121   pentafluoroprop-tetrafluoroeth (GEBAUERS) aerosol 1 application, 1 application., Topical, PRN, Pearson Grippe B, MD   predniSONE (DELTASONE) tablet 15 mg, 15 mg, Oral, Q breakfast, Gaylan Gerold, DO, 15 mg at 11/14/21 3299   prochlorperazine (COMPAZINE) injection 10 mg, 10 mg, Intravenous, Q6H PRN, Hosie Poisson, MD, 10 mg at 11/12/21 1430   zinc sulfate capsule 220 mg, 220 mg, Oral, Daily, Hosie Poisson, MD, 220 mg at 11/14/21 1113  Patients Current Diet:  Diet Order             Diet renal with fluid restriction Fluid restriction: 1200 mL Fluid; Room service appropriate? Yes; Fluid consistency: Thin  Diet effective now                   Precautions / Restrictions Precautions Precautions: Fall Restrictions Weight Bearing Restrictions: No   Has the patient had 2 or more falls or a fall with  injury in the past year? No  Prior Activity Level Community (5-7x/wk): drives, works, dialysis  Prior Functional Level Self Care: Did the patient need help bathing, dressing, using the toilet or eating? Independent  Indoor Mobility: Did the patient need assistance with walking from room to room (with or without device)? Independent  Stairs: Did the patient need assistance with internal or external stairs (with or without device)? Independent  Functional Cognition: Did  the patient need help planning regular tasks such as shopping or remembering to take medications? {Prior Functional CBSWH:675916384}  Patient Information Are you of Hispanic, Latino/a,or Spanish origin?: Y. Patient declines to respond What is your race?: Y. Patient declines to respond Do you need or want an interpreter to communicate with a doctor or health care staff?: 0. No  Patient's Response To:  Health Literacy and Transportation Is the patient able to respond to health literacy and transportation needs?: Yes Health Literacy - How often do you need to have someone help you when you read instructions, pamphlets, or other written material from your doctor or pharmacy?: Never In the past 12 months, has lack of transportation kept you from medical appointments or from getting medications?: Yes In the past 12 months, has lack of transportation kept you from meetings, work, or from getting things needed for daily living?: Yes  Home Assistive Devices / Equipment Home Equipment: None  Prior Device Use: Indicate devices/aids used by the patient prior to current illness, exacerbation or injury? Walker  Current Functional Level Cognition  Arousal/Alertness: Awake/alert Overall Cognitive Status: Impaired/Different from baseline Current Attention Level: Selective Orientation Level: Oriented X4 Following Commands: Follows one step commands with increased time Attention: Sustained Sustained Attention: Impaired Sustained  Attention Impairment: Verbal basic Memory: Impaired Memory Impairment: Storage deficit, Retrieval deficit, Decreased recall of new information Awareness: Impaired Awareness Impairment: Intellectual impairment Problem Solving: Impaired Problem Solving Impairment: Functional basic Executive Function: Reasoning, Decision Making Reasoning: Impaired Reasoning Impairment: Functional basic Decision Making: Impaired Decision Making Impairment: Functional basic    Extremity Assessment (includes Sensation/Coordination)  Upper Extremity Assessment: Difficult to assess due to impaired cognition, LUE deficits/detail, RUE deficits/detail RUE Deficits / Details: full AROM observed during functional task RUE Coordination: decreased fine motor LUE Deficits / Details: full AROM observed during functional task LUE Coordination: decreased fine motor  Lower Extremity Assessment: Defer to PT evaluation RLE Deficits / Details: ROM WFL, strength grossly 3+/5 RLE Sensation: decreased light touch RLE Coordination: decreased fine motor LLE Deficits / Details: ROM WFL, strength grossly 3+/5 LLE Sensation: decreased light touch LLE Coordination: decreased fine motor    ADLs  Overall ADL's : Needs assistance/impaired Eating/Feeding: Set up Grooming: Minimal assistance, Set up Upper Body Bathing: Set up, Minimal assistance Lower Body Bathing: Set up Upper Body Dressing : Set up, Sitting Lower Body Dressing: Set up General ADL Comments: mod I with bed mobility, observed to come to long sitting and maintain for a minute or so. Assist d/t cognition>strength.    Mobility  Overal bed mobility: Needs Assistance Bed Mobility: Supine to Sit, Sit to Supine Supine to sit: Min guard, HOB elevated Sit to supine: Supervision General bed mobility comments: Incr time to come to sitting with repeated cues to complete task. Instructed pt to focus on one item to stabilize gaze to decr dizziness with getting up     Transfers  Overall transfer level: Needs assistance Equipment used: 2 person hand held assist Transfers: Sit to/from Stand Sit to Stand: +2 physical assistance, Min assist General transfer comment: Assist to bring hips up and for balance. Cues to stabilize gaze on object    Ambulation / Gait / Stairs / Wheelchair Mobility  Ambulation/Gait Ambulation/Gait assistance: +2 physical assistance, Min assist Gait Distance (Feet): 2 Feet (forward and backward) Assistive device: 2 person hand held assist Gait Pattern/deviations: Step-to pattern, Decreased step length - right, Decreased step length - left, Shuffle, Trunk flexed, Narrow base of support General Gait Details: Assist for  balance and support. Pt requesting to immediately step back to bed and lie down Gait velocity: decr Gait velocity interpretation: <1.31 ft/sec, indicative of household ambulator    Posture / Balance Balance Overall balance assessment: Needs assistance Sitting-balance support: No upper extremity supported, Feet supported Sitting balance-Leahy Scale: Fair Standing balance support: Bilateral upper extremity supported Standing balance-Leahy Scale: Poor Standing balance comment: UE support and min assist    Special needs/care consideration NA   Previous Home Environment (from acute therapy documentation) Living Arrangements: Alone Available Help at Discharge: Friend(s), Available PRN/intermittently Type of Home: Apartment Home Layout: One level Home Access: Level entry Entrance Stairs-Rails: Can reach both Entrance Stairs-Number of Steps: 2 Bathroom Shower/Tub: Multimedia programmer: Standard Bathroom Accessibility: Yes How Accessible: Accessible via walker [place]: No  Discharge Living Setting Plans for Discharge Living Setting: Patient's home Type of Home at Discharge: Apartment Discharge Home Layout: One level Discharge Home Access: Level entry Discharge Bathroom Shower/Tub: Walk-in  shower Discharge Bathroom Toilet: Standard Discharge Bathroom Accessibility: Yes How Accessible: Accessible via walker  Social/Family/Support Systems Anticipated Caregiver: Elsie Amis, friend Anticipated Caregiver's Contact Information: [phone] Caregiver Availability: Intermittent Discharge Plan Discussed with Primary Caregiver: Yes Is Caregiver In Agreement with Plan?: Yes Does Caregiver/Family have Issues with Lodging/Transportation while Pt is in Rehab?: No  Goals Patient/Family Goal for Rehab: *** Expected length of stay: *** Pt/Family Agrees to Admission and willing to participate: Yes Program Orientation Provided & Reviewed with Pt/Caregiver Including Roles  & Responsibilities: Yes  Decrease burden of Care through IP rehab admission: NA  Possible need for SNF placement upon discharge: Not anticipated  Patient Condition: I have reviewed medical records from [facility], spoken with CM, and patient. I {CHL IP at bedside or phone:304550005} for inpatient rehabilitation assessment.  Patient will benefit from ongoing {CHL IP PT OT SAY:301601093}, can actively participate in 3 hours of therapy a day 5 days of the week, and can make measurable gains during the admission.  Patient will also benefit from the coordinated team approach during an Inpatient Acute Rehabilitation admission.  The patient will receive intensive therapy as well as Rehabilitation physician, nursing, social worker, and care management interventions.  Due to safety, disease management, medication administration, pain management, and patient education the patient requires 24 hour a day rehabilitation nursing.  The patient is currently *** with mobility and basic ADLs.  Discharge setting and therapy post discharge at [facility] IP discharge location:304550006} is anticipated.  Patient has agreed to participate in the Acute Inpatient Rehabilitation Program and will admit {Time; today/tomorrow:10263}.  Preadmission  Screen Completed By:  Bethel Born, 11/14/2021 2:08 PM ______________________________________________________________________   Discussed status with Dr. Marland Kitchen on *** at *** and received approval for admission today.  Admission Coordinator:  Bethel Born, CCC-SLP, time ***/Date ***   Assessment/Plan: Diagnosis: Does the need for close, 24 hr/day Medical supervision in concert with the patient's rehab needs make it unreasonable for this patient to be served in a less intensive setting? {yes_no_potentially:3041433} Co-Morbidities requiring supervision/potential complications: *** Due to {due AT:5573220}, does the patient require 24 hr/day rehab nursing? {yes_no_potentially:3041433} Does the patient require coordinated care of a physician, rehab nurse, PT, OT, and SLP to address physical and functional deficits in the context of the above medical diagnosis(es)? {yes_no_potentially:3041433} Addressing deficits in the following areas: {deficits:3041436} Can the patient actively participate in an intensive therapy program of at least 3 hrs of therapy 5 days a week? {yes_no_potentially:3041433} The potential for patient to  make measurable gains while on inpatient rehab is {potential:3041437} Anticipated functional outcomes upon discharge from inpatient rehab: {functional outcomes:304600100} PT, {functional outcomes:304600100} OT, {functional outcomes:304600100} SLP Estimated rehab length of stay to reach the above functional goals is: *** Anticipated discharge destination: {anticipated dc setting:21604} 10. Overall Rehab/Functional Prognosis: {potential:3041437}   MD Signature: ***

## 2021-11-14 NOTE — Progress Notes (Signed)
Inpatient Rehab Admissions Coordinator:  ?Due to pt being on airborne/COVID precautions, spoke with pt on the telephone. Pt appeared more alert than during previous conversation. Pt able to clarify information provided over the weekend. Pt also provided the name of another friend who may be able to provide assistance after discharge. Pt could not recall person's number at this time. Will continue to follow.  ? ?Gayland Curry, MS, CCC-SLP ?Admissions Coordinator ?[phone] ? ?

## 2021-11-14 NOTE — Progress Notes (Signed)
PT Cancellation Note ? ?Patient Details ?Name: [name] [name] ?MRN: [id number] ?DOB: 1980-10-29 ? ? ?Cancelled Treatment:    Reason Eval/Treat Not Completed: Patient at procedure or test/unavailable. Pt just left for HD. ? ? ?Shary Decamp [facility] ?11/14/2021, 2:49 PM ?Suanne Marker PT ?Acute Rehabilitation Services ?Pager [phone] ?Office [phone] ? ?

## 2021-11-14 NOTE — Progress Notes (Signed)
Subjective: Seen in room no complaints no shortness of breath appears normal MS . ? ?Objective ?Vital signs in last 24 hours: ?Vitals:  ? 11/13/21 2024 11/14/21 0005 11/14/21 0418 11/14/21 0917  ?BP: 132/64 138/70 (!) 144/79 127/63  ?Pulse: 74 80 79 83  ?Resp: '18 18 18 16  '$ ?Temp: 97.8 ?F (36.6 ?C) 98.3 ?F (36.8 ?C) 97.9 ?F (36.6 ?C) 98.2 ?F (36.8 ?C)  ?TempSrc: Oral Oral Oral Oral  ?SpO2: 96% 95% 95% 95%  ?Weight:      ?Height:      ? ?Weight change:  ? ?Physical Exam: ?General: Alert female pleasant, NAD O x3 ?Heart: RRR no MRG ?Lungs: CTA nonlabored breathing ?Abdomen: NABS, soft, NTND ?Extremities: No pedal edema  ?dialysis Access: Positive bruit right upper arm AV fistula ? ?OP HD: AF MWF ? 3h 9mn  400/500  59kg  2/2 bath  RUE AVF  15g  Hep 2000 ? - hectorol 6 ug tiw IV ? - no esa ? ?Problem/Plan: ?AMS -now resolved, no seizures by EEG.  Thought to be metabolic due to ^[facility] uremia/ esrd, possible acute SDH.  Also COVID-positive on admission, NSurg evaluating. MS better w/ lactulose and HD. Asterixis resolved.  ?COVID pna - basilar infiltrates on xrays. Viral and/ or bacterial infeciton.  On IV Rocephin. SP molnupiravir. Pt not volume overloaded. COVID precautions should lift on 11/15/21.  ?ESRD - usual HD MWF. Next HD today on schedule, no heparin HD ?HTN - BP's were low, improved 127/63 this a.m. last week, some hypertension, BP meds resumed. 6kg under dry wt. looking dry, was given NS at 75 cc/hr this weekend  Keep even w/ this HD.  ?H/o failed pancreas/ renal transplant - pancreas still viable taking CyA, pred and myfortic. CyA and myfortic held initially. CyA was resumed at slightly lower dose of '200mg'$  am and '100mg'$  pm (high trough off 309 here). Myfortic was restarted after IV abx were completed on 3/11 at the usual dose . Getting prednisone at 15 qd, this can probably be tapered back down to usual dose 5 mg/ d. Pt not seeing anybody for her pancreas transplant. We will f/u on CyA levels post dc at the OP  HD unit.  ?Small left frontal subdural hematoma unclear etiology possibly coagulopathy work-up per admit /neurology /NS ?MBD ckd -phosphorus 7 3 binders resumed. Ca in range,  Cont IV vdra follow-up calcium phosphorus trend ?Anemia ckd -a.m. Hgb 9.8 was 11.3 follow-up trend if still below 10 5 start  esa  ?Thrombocytopenia= platelets 91 noted heme being consulted, history of chronic low platelets slightly worse since admission ? ?DErnest Haber PA-C ?[facility] ?Beeper [phone]?11/14/2021,10:56 AM ? LOS: 8 days  ? ?Labs: ?Basic Metabolic Panel: ?Recent Labs  ?Lab 11/12/21 ?0263303/12/23 ?0354503/13/23 ?0423  ?NA 137 137 138  ?K 3.7 3.3* 3.7  ?CL 97* 99 100  ?CO2 20* 24 19*  ?GLUCOSE 100* 89 77  ?BUN 94* 49* 70*  ?CREATININE 8.68* 6.09* 8.26*  ?CALCIUM 8.7* 8.6* 8.3*  ?PHOS 5.3* 6.2* 7.3*  ? ?Liver Function Tests: ?Recent Labs  ?Lab 11/09/21 ?0109 11/10/21 ?0625603/11/23 ?0389303/12/23 ?0734203/13/23 ?0423  ?AST 44*  --   --   --   --   ?ALT 25  --   --   --   --   ?ALKPHOS 58  --   --   --   --   ?BILITOT 0.5  --   --   --   --   ?  PROT 7.0  --   --   --   --   ?ALBUMIN 2.9*  2.9*   < > 2.8* 2.9* 2.7*  ? < > = values in this interval not displayed.  ? ?No results for input(s): LIPASE, AMYLASE in the last 168 hours. ?No results for input(s): AMMONIA in the last 168 hours. ?CBC: ?Recent Labs  ?Lab 11/09/21 ?0109 11/10/21 ?9622 11/11/21 ?0225 11/12/21 ?2979 11/14/21 ?0423  ?WBC 5.0 3.2* 3.5* 3.4* 3.8*  ?NEUTROABS 4.5 2.7 2.5 2.4 2.8  ?HGB 13.5 12.8 12.8 11.3* 9.8*  ?HCT 41.2 39.8 37.6 33.3* 29.3*  ?MCV 91.6 92.8 89.3 87.6 89.6  ?PLT 54* 50* 61* 67* 91*  ? ?Cardiac Enzymes: ?No results for input(s): CKTOTAL, CKMB, CKMBINDEX, TROPONINI in the last 168 hours. ?CBG: ?Recent Labs  ?Lab 11/11/21 ?8921 11/11/21 ?1140 11/11/21 ?1632 11/11/21 ?2009 11/12/21 ?1941  ?GLUCAP 103* 125* 162* 128* 94  ? ? ?Studies/Results: ?CT HEAD WO CONTRAST (5MM) ? ?Result Date: 11/12/2021 ?CLINICAL DATA:  New onset of dizziness,  altered mental status, encephalopathy, single episode of vomiting, history stroke, hypertension, end-stage renal disease EXAM: CT HEAD WITHOUT CONTRAST TECHNIQUE: Contiguous axial images were obtained from the base of the skull through the vertex without intravenous contrast. RADIATION DOSE REDUCTION: This exam was performed according to the departmental dose-optimization program which includes automated exposure control, adjustment of the mA and/or kV according to patient size and/or use of iterative reconstruction technique. COMPARISON:  11/11/2021 FINDINGS: Brain: Generalized atrophy. Normal ventricular morphology. No midline shift or mass effect. Small BILATERAL medial cerebellar infarcts. Small frontal subdural hematomas are seen bilaterally, greater on LEFT at 9 mm thick stable but now crossing the midline to RIGHT 5 mm thick and slightly increased along anterior falx since previous exam. No midline shift or mass effect. No new areas of infarction or mass. No intraparenchymal hemorrhage. Vascular: Atherosclerotic calcification of internal carotid and vertebral arteries at skull base Skull: Intact Sinuses/Orbits: Clear Other: N/A IMPRESSION: Small BILATERAL frontal subdural hematomas, stable on LEFT at 9 mm greatest thickness but slightly increased at the falx and now crossing the midline to the RIGHT frontal region measuring 5 mm thick. No mass effect. Small LEFT cerebellar infarct. No new infarcts identified. Electronically Signed   By: Lavonia Dana M.D.   On: 11/12/2021 18:42   ?Medications: ? sodium chloride    ? sodium chloride    ? ? amLODipine  10 mg Oral Daily  ? vitamin C  500 mg Oral Daily  ? aspirin EC  81 mg Oral Daily  ? Chlorhexidine Gluconate Cloth  6 each Topical Daily  ? cholecalciferol  1,000 Units Oral Daily  ? cloNIDine  0.1 mg Transdermal Weekly  ? cycloSPORINE modified  100 mg Oral Q1400  ? cycloSPORINE modified  200 mg Oral Q breakfast  ? doxercalciferol  6 mcg Intravenous Q M,W,F-HD  ?  famotidine  20 mg Oral Daily  ? ferric citrate  630 mg Oral TID WC  ? labetalol  300 mg Oral TID  ? lactulose  30 g Oral BID  ? mycophenolate  540 mg Oral BID  ? predniSONE  15 mg Oral Q breakfast  ? zinc sulfate  220 mg Oral Daily  ? ? ? ? ?

## 2021-11-14 NOTE — TOC Progression Note (Signed)
Transition of Care (TOC) - Progression Note  ? ? ?Patient Details  ?Name: [name] [name] ?MRN: [id number] ?Date of Birth: 1981-06-12 ? ?Transition of Care (TOC) CM/SW Contact  ?Tom-Johnson, Renea Ee, RN ?Phone Number: ?11/14/2021, 10:08 AM ? ?Clinical Narrative:    ? ?PT updated recommendations to Acute Inpatient Rehab. AIR following. CM will continue to follow with needs. ? ?Expected Discharge Plan: [place] ?Barriers to Discharge: Continued Medical Work up ? ?Expected Discharge Plan and Services ?Expected Discharge Plan: [place] ?  ?Discharge Planning Services: CM Consult ?Post Acute Care Choice: Home Health ?Living arrangements for the past 2 months: [place] ?                ?DME Arranged: Walker rolling ?DME Agency: [employer] ?Date DME Agency Contacted: 11/11/21 ?Time DME Agency Contacted: [phone] ?Representative spoke with at DME Agency: Freda Munro ?HH Arranged: PT, OT, RN ?[place] Agency: [facility] ?Date HH Agency Contacted: 11/11/21 ?Time [place]: 0768 ?Representative spoke with at [place]: Delsa Sale ? ? ?Social Determinants of Health (SDOH) Interventions ?  ? ?Readmission Risk Interventions ?No flowsheet data found. ? ?

## 2021-11-14 NOTE — Progress Notes (Signed)
[name]                                                                               [name] [name], is a 41 y.o. female, DOB - 1981-01-15, VEH:[id number] Admit date - 11/05/2021    Outpatient Primary MD for the [name] [name] is [name] [name], No Pcp Per (Inactive)  LOS - 8  days    Brief summary   [name] [name] is a 41 year old unfortunate female history of end-stage renal disease on HD on TTS, status post renal and pancreatic transplant, complicated by medical nonadherence and rejection, hypertension, gastroparesis, anxiety, anemia and narcotic abuse presented to the ED with acute onset of altered mental status and confusion.  It was noted that 1 of [name] [name]'s friends had not heard from her for the last couple of days and EMS called.  When EMS got there [name] [name] noted to be conscious but disoriented.  Unknown as to whether [name] [name] had a last hemodialysis session.  [name] [name] seen in the ED noted to have pinpoint pupils and altered mental status noted to be somnolent and arousable.  [name] [name] given a dose of IV Narcan which significantly improved her mental status briefly.  [name] [name] seen in the ED COVID-19 PCR obtained was positive.  Influenza PCR negative.  EKG showed normal sinus rhythm and some peaked T waves anterior laterally.  Initial high-sensitivity troponin was 76.  Chest x-ray showed mild pulmonary edema likely secondary to volume overload.  Noncontrast head CT negative. She was transferred to ICU for acute encephalopathy and worsening respiratory status.    Assessment & Plan    Assessment and Plan: * Acute toxic and metabolic  encephalopathy Acute ischemic stroke  Small left frontal sub dural hematoma of unclear etiology possibly coagulopathy.  - This is likely multifactorial.  It could be related to COVID encephalopathy as well as uremia with end-stage renal disease on hemodialysis with question about compliance and certainly polypharmacy with opiate overdose, given response to IV Narcan.,  vs from stroke and sub dural hematoma. -CT head negative for any acute abnormalities on admission.  MRI brain without contrast  on 3/8 showed Small acute infarcts in the posterior paramidline cerebellum bilaterally. She was also found to have anterior left frontal subdural hemorrhage, confirmed by a CT HEAD without contrast.  - Neurology on board and Neurosurgery consulted on 3/9. LP done and does not appear to be consistent with meningitis, discontinued antivirals and anti biotics.  - further stroke work up in progress with echocardiogram showed Left ventricular ejection fraction, by estimation, is 55 to 60%. The left ventricle has normal function. The left ventricle has no regional  wall motion abnormalities. There is severe concentric left ventricular  hypertrophy. Left ventricular diastolic  parameters are consistent with Grade I diastolic dysfunction . MRI angio head without contrast showed Negative MRA for large vessel occlusion or other acute finding. Age advanced intracranial small vessel atheromatous disease, but no hemodynamically significant. hemoglobin A1c  is 3.9% Lipid panel shows direct LDL of  60.  Unfortunately not a candidate for aspirin or plavix due to hematoma with low platelets.  Hypercoagulable work up sent by neurology.  Outpatient follow up with  neuro surgery Dr Zada Finders  in 2 weeks and keep platelet count greater than 50,000.   Therapy eval recommending CIR, consult in place.    ESRD on HD with Monday , Wednesday and Friday scheduled.  Nephrology on board . Appreciate recommendations.     Pulmonary edema - Pulmonary edema noted on chest x-ray likely secondary to fluid overload secondary to end-stage renal disease with probable noncompliance with hemodialysis.   - further management as per nephrology.  -Nephrology following.  Hypertensive urgency-  BP parameters are optimal.  Added norvasc 10 mg daily, labetalol 300 mg TID, ( was taking 600 mg BID) and  clonidine 0.1 mg BID.  On prn hydralazine.    Type 2 diabetes mellitus with peripheral neuropathy (HCC) -Repeat hemoglobin A1c 3.9. CBG (last 3)  Recent Labs    11/11/21 1632 11/11/21 2009 11/12/21 0634  GLUCAP 162* 128* 94    Continue with SSI. No changes in meds.   Gout - Colchicine as needed.  History of failed pancreas/ renal transplant -She is status post renal and pancreatic transplant with antirejection medication noncompliance. - She is currently being evaluated for renal transplant. - she was restarted on cyclosporine 200 mg during the day and 100 mg at night, in addition to prednisone 15 mg daily. - myfortic restarted.  - pt reports that she was asked to stay on 15 mg of prednisone by Dr Moshe Cipro.   Fever on admission.  From CAP vs COVID infection.  Afebrile in the last 24 hours.  - completed 5 days of IV rocephin.   COVID-19 virus infection - [name] [name] noted to be positive for COVID-19 infection -Follow inflammatory markers. -[name] [name] chronically on prednisone. -completed the course of  molnupiravir. -Supportive care.   Hyperkalemia Resolved with HD.    Chronic thrombocytopenia:  - platelets have been around 120000 in 02/2021, dropped to 67,000 this admission.  - continue to monitor.  -hematology consulted and recommendations given.   Anemia of chronic disease from ESRD Transfuse to keep hemoglobin greater than 7.   Pancytopenia: ? Secondary to immunosuppressants.  Continue to monitor.     Estimated body mass index is 20.21 kg/m as calculated from the following:   Height as of this encounter: '5\' 4"'$  (1.626 m).   Weight as of this encounter: 53.4 kg.  Code Status: full code.  DVT Prophylaxis:  Place and maintain sequential compression device Start: 11/06/21 0347   Level of Care: Level of care: med surg.  Family Communication: None at bedside.   Disposition Plan:     Remains inpatient appropriate:  CIR placement. Pt is medically stable for  discharge.   Procedures:  MRI brain.   Consultants:   Neurology Nephrology.  Neuro surgery.   Antimicrobials:   Anti-infectives (From admission, onward)    Start     Dose/Rate Route Frequency Ordered Stop   11/10/21 1800  acyclovir (ZOVIRAX) 260 mg in dextrose 5 % 100 mL IVPB  Status:  Discontinued        260 mg 105.2 mL/hr over 60 Minutes Intravenous Every 24 hours 11/09/21 1231 11/10/21 0857   11/09/21 1500  cefTRIAXone (ROCEPHIN) 2 g in sodium chloride 0.9 % 100 mL IVPB        2 g 200 mL/hr over 30 Minutes Intravenous Every 24 hours 11/09/21 0840 11/11/21 1533   11/09/21 1400  acyclovir (ZOVIRAX) 260 mg in dextrose 5 % 100 mL IVPB        260 mg 105.2 mL/hr over 60 Minutes Intravenous Once 11/09/21 1231 11/09/21 1455  11/09/21 1200  vancomycin (VANCOREADY) IVPB 500 mg/100 mL  Status:  Discontinued        500 mg 100 mL/hr over 60 Minutes Intravenous Every M-W-F (Hemodialysis) 11/07/21 0917 11/09/21 0840   11/09/21 0000  piperacillin-tazobactam (ZOSYN) IVPB 2.25 g  Status:  Discontinued       See Hyperspace for full Linked Orders Report.   2.25 g 100 mL/hr over 30 Minutes Intravenous Every 8 hours 11/08/21 1520 11/09/21 0840   11/08/21 1800  acyclovir (ZOVIRAX) 260 mg in dextrose 5 % 100 mL IVPB  Status:  Discontinued        260 mg 105.2 mL/hr over 60 Minutes Intravenous Every 24 hours 11/08/21 0735 11/09/21 1231   11/08/21 1800  ampicillin (OMNIPEN) 2 g in sodium chloride 0.9 % 100 mL IVPB  Status:  Discontinued        2 g 300 mL/hr over 20 Minutes Intravenous Every 24 hours 11/08/21 0735 11/08/21 1505   11/08/21 1615  piperacillin-tazobactam (ZOSYN) IVPB 3.375 g       See Hyperspace for full Linked Orders Report.   3.375 g 100 mL/hr over 30 Minutes Intravenous  Once 11/08/21 1520 11/08/21 1624   11/07/21 1200  vancomycin (VANCOREADY) IVPB 500 mg/100 mL  Status:  Discontinued        500 mg 100 mL/hr over 60 Minutes Intravenous Every M-W-F (Hemodialysis) 11/07/21 0812  11/07/21 0917   11/07/21 1015  ampicillin (OMNIPEN) 2 g in sodium chloride 0.9 % 100 mL IVPB  Status:  Discontinued        2 g 300 mL/hr over 20 Minutes Intravenous Every 24 hours 11/07/21 0927 11/08/21 0735   11/07/21 1015  acyclovir (ZOVIRAX) 260 mg in dextrose 5 % 100 mL IVPB  Status:  Discontinued        260 mg 105.2 mL/hr over 60 Minutes Intravenous Every 24 hours 11/07/21 0927 11/08/21 0735   11/07/21 0900  vancomycin (VANCOCIN) IVPB 1000 mg/200 mL premix        1,000 mg 200 mL/hr over 60 Minutes Intravenous  Once 11/07/21 0812 11/07/21 0956   11/07/21 0900  ceFEPIme (MAXIPIME) 1 g in sodium chloride 0.9 % 100 mL IVPB  Status:  Discontinued        1 g 200 mL/hr over 30 Minutes Intravenous Daily at bedtime 11/07/21 0812 11/08/21 1520   11/06/21 1000  molnupiravir EUA (LAGEVRIO) capsule 800 mg        4 capsule Oral 2 times daily 11/06/21 0332 11/11/21 0959        Medications  Scheduled Meds:  amLODipine  10 mg Oral Daily   vitamin C  500 mg Oral Daily   aspirin EC  81 mg Oral Daily   Chlorhexidine Gluconate Cloth  6 each Topical Daily   cholecalciferol  1,000 Units Oral Daily   cloNIDine  0.1 mg Transdermal Weekly   cycloSPORINE modified  100 mg Oral Q1400   cycloSPORINE modified  200 mg Oral Q breakfast   doxercalciferol  6 mcg Intravenous Q M,W,F-HD   famotidine  20 mg Oral Daily   ferric citrate  630 mg Oral TID WC   labetalol  300 mg Oral TID   lactulose  30 g Oral BID   mycophenolate  540 mg Oral BID   predniSONE  15 mg Oral Q breakfast   zinc sulfate  220 mg Oral Daily   Continuous Infusions:  sodium chloride     sodium chloride     PRN Meds:.sodium chloride,  sodium chloride, acetaminophen, colchicine, diphenhydrAMINE, heparin, heparin, hydrALAZINE, lidocaine (PF), lidocaine-prilocaine, meclizine, metoCLOPramide, naLOXone (NARCAN)  injection, ondansetron **OR** ondansetron (ZOFRAN) IV, oxyCODONE, pentafluoroprop-tetrafluoroeth, prochlorperazine    Subjective:    [name] [name] was seen and examined today.  No new complaints.   Vitals:   11/13/21 2024 11/14/21 0005 11/14/21 0418 11/14/21 0917  BP: 132/64 138/70 (!) 144/79 127/63  Pulse: 74 80 79 83  Resp: '18 18 18 16  '$ Temp: 97.8 F (36.6 C) 98.3 F (36.8 C) 97.9 F (36.6 C) 98.2 F (36.8 C)  TempSrc: Oral Oral Oral Oral  SpO2: 96% 95% 95% 95%  Weight:      Height:        Intake/Output Summary (Last 24 hours) at 11/14/2021 1312 Last data filed at 11/14/2021 0913 Gross per 24 hour  Intake 892.62 ml  Output --  Net 892.62 ml    Filed Weights   11/06/21 0221 11/07/21 0300 11/07/21 0700  Weight: 61.2 kg 56.5 kg 53.4 kg     Exam General exam: Appears calm and comfortable  Respiratory system: Clear to auscultation. Respiratory effort normal. Cardiovascular system: S1 & S2 heard, RRR. No JVD, No pedal edema. Gastrointestinal system: Abdomen is nondistended, soft and nontender. No organomegaly or masses felt. Normal bowel sounds heard. Central nervous system: Alert and oriented. No focal neurological deficits. Extremities: Symmetric 5 x 5 power. Skin: No rashes, lesions or ulcers Psychiatry:  Mood & affect appropriate.        Data Reviewed:  I have personally reviewed following labs and imaging studies   CBC Lab Results  Component Value Date   WBC 3.8 (L) 11/14/2021   RBC 3.27 (L) 11/14/2021   RBC 3.33 (L) 11/14/2021   HGB 9.8 (L) 11/14/2021   HCT 29.3 (L) 11/14/2021   MCV 89.6 11/14/2021   MCH 30.0 11/14/2021   PLT 91 (L) 11/14/2021   MCHC 33.4 11/14/2021   RDW 12.4 11/14/2021   LYMPHSABS 0.7 11/14/2021   MONOABS 0.3 11/14/2021   EOSABS 0.0 11/14/2021   BASOSABS 0.0 40/06/2724     Last metabolic panel Lab Results  Component Value Date   NA 138 11/14/2021   K 3.7 11/14/2021   CL 100 11/14/2021   CO2 19 (L) 11/14/2021   BUN 70 (H) 11/14/2021   CREATININE 8.26 (H) 11/14/2021   GLUCOSE 77 11/14/2021   GFRNONAA 6 (L) 11/14/2021   GFRAA 3 (L) 05/14/2020    CALCIUM 8.3 (L) 11/14/2021   PHOS 7.3 (H) 11/14/2021   PROT 7.0 11/09/2021   ALBUMIN 2.7 (L) 11/14/2021   BILITOT 0.5 11/09/2021   ALKPHOS 58 11/09/2021   AST 44 (H) 11/09/2021   ALT 25 11/09/2021   ANIONGAP 19 (H) 11/14/2021    CBG (last 3)  Recent Labs    11/11/21 1632 11/11/21 2009 11/12/21 0634  GLUCAP 162* 128* 94       Coagulation Profile: No results for input(s): INR, PROTIME in the last 168 hours.   Radiology Studies: CT HEAD WO CONTRAST (5MM)  Result Date: 11/12/2021 CLINICAL DATA:  New onset of dizziness, altered mental status, encephalopathy, single episode of vomiting, history stroke, hypertension, end-stage renal disease EXAM: CT HEAD WITHOUT CONTRAST TECHNIQUE: Contiguous axial images were obtained from the base of the skull through the vertex without intravenous contrast. RADIATION DOSE REDUCTION: This exam was performed according to the departmental dose-optimization program which includes automated exposure control, adjustment of the mA and/or kV according to [name] [name] size and/or use of iterative reconstruction technique. COMPARISON:  11/11/2021 FINDINGS: Brain: Generalized atrophy. Normal ventricular morphology. No midline shift or mass effect. Small BILATERAL medial cerebellar infarcts. Small frontal subdural hematomas are seen bilaterally, greater on LEFT at 9 mm thick stable but now crossing the midline to RIGHT 5 mm thick and slightly increased along anterior falx since previous exam. No midline shift or mass effect. No new areas of infarction or mass. No intraparenchymal hemorrhage. Vascular: Atherosclerotic calcification of internal carotid and vertebral arteries at skull base Skull: Intact Sinuses/Orbits: Clear Other: N/A IMPRESSION: Small BILATERAL frontal subdural hematomas, stable on LEFT at 9 mm greatest thickness but slightly increased at the falx and now crossing the midline to the RIGHT frontal region measuring 5 mm thick. No mass effect. Small LEFT  cerebellar infarct. No new infarcts identified. Electronically Signed   By: Lavonia Dana M.D.   On: 11/12/2021 18:42   VAS US CAROTID (at [facility] and [facility] only)  Result Date: 11/14/2021 Carotid Arterial Duplex Study [name] [name] Name:  ASHONTI LEANDRO  Date of Exam:   11/14/2021 Medical Rec #: [id number]           Accession #:    [id number] Date of Birth: 1981/02/21            [name] [name] Gender: F [name] [name] Age:   30 years Exam Location:  [facility] Procedure:      VAS US CAROTID Referring Phys: Amie Portland --------------------------------------------------------------------------------  Indications:       CVA. Risk Factors:      Hypertension, past history of smoking. Other Factors:     ESRD (HD). Comparison Study:  No previous exams Performing Technologist: Jody Hill RVT, RDMS  Examination Guidelines: A complete evaluation includes B-mode imaging, spectral Doppler, color Doppler, and power Doppler as needed of all accessible portions of each vessel. Bilateral testing is considered an integral part of a complete examination. Limited examinations for reoccurring indications may be performed as noted.  Right Carotid Findings: +----------+--------+--------+--------+------------------+------------------+             PSV cm/s EDV cm/s Stenosis Plaque Description Comments            +----------+--------+--------+--------+------------------+------------------+  CCA Prox   88       6                                    intimal thickening  +----------+--------+--------+--------+------------------+------------------+  CCA Distal 72       11                                   intimal thickening  +----------+--------+--------+--------+------------------+------------------+  ICA Prox   39       12                                   intimal thickening  +----------+--------+--------+--------+------------------+------------------+  ICA Distal 66       15                                                        +----------+--------+--------+--------+------------------+------------------+  ECA        83  0                                                        +----------+--------+--------+--------+------------------+------------------+ +----------+--------+-------+--------+-------------------+             PSV cm/s EDV cms Describe Arm Pressure (mmHG)  +----------+--------+-------+--------+-------------------+  Subclavian 161      29                                    +----------+--------+-------+--------+-------------------+ +---------+--------+--+--------+-+---------+  Vertebral PSV cm/s 32 EDV cm/s 5 Antegrade  +---------+--------+--+--------+-+---------+  Left Carotid Findings: +----------+--------+--------+--------+---------------------+------------------+             PSV cm/s EDV cm/s Stenosis Plaque Description    Comments            +----------+--------+--------+--------+---------------------+------------------+  CCA Prox   120      0                                                           +----------+--------+--------+--------+---------------------+------------------+  CCA Distal 64       8                                       intimal thickening  +----------+--------+--------+--------+---------------------+------------------+  ICA Prox   48       14                                      intimal thickening  +----------+--------+--------+--------+---------------------+------------------+  ICA Distal 68       15                                                          +----------+--------+--------+--------+---------------------+------------------+  ECA        106      0                 hyperechoic and focal intimal thickening  +----------+--------+--------+--------+---------------------+------------------+ +----------+--------+--------+--------+-------------------+             PSV cm/s EDV cm/s Describe Arm Pressure (mmHG)  +----------+--------+--------+--------+-------------------+  Subclavian 124                                              +----------+--------+--------+--------+-------------------+ +---------+--------+--+--------+--+---------+  Vertebral PSV cm/s 50 EDV cm/s 13 Antegrade  +---------+--------+--+--------+--+---------+      Preliminary        Hosie Poisson M.D. [name] 11/14/2021, 1:12 PM  Available via Epic secure chat 7am-7pm After 7 pm, please refer to night coverage provider listed on amion.

## 2021-11-15 LAB — RENAL FUNCTION PANEL
Albumin: 2.8 g/dL — ABNORMAL LOW (ref 3.5–5.0)
Anion gap: 15 (ref 5–15)
BUN: 29 mg/dL — ABNORMAL HIGH (ref 6–20)
CO2: 26 mmol/L (ref 22–32)
Calcium: 8.5 mg/dL — ABNORMAL LOW (ref 8.9–10.3)
Chloride: 96 mmol/L — ABNORMAL LOW (ref 98–111)
Creatinine, Ser: 5.38 mg/dL — ABNORMAL HIGH (ref 0.44–1.00)
GFR, Estimated: 10 mL/min — ABNORMAL LOW (ref >60)
Glucose, Bld: 74 mg/dL (ref 70–99)
Phosphorus: 5.5 mg/dL — ABNORMAL HIGH (ref 2.5–4.6)
Potassium: 3.1 mmol/L — ABNORMAL LOW (ref 3.5–5.1)
Sodium: 137 mmol/L (ref 135–145)

## 2021-11-15 LAB — GLUCOSE, CAPILLARY
Glucose-Capillary: 93 mg/dL (ref 70–99)
Glucose-Capillary: 92 mg/dL (ref 70–99)
Glucose-Capillary: 133 mg/dL — ABNORMAL HIGH (ref 70–99)

## 2021-11-15 MED ORDER — OXYCODONE HCL 5 MG PO TABS: 5.0000 mg | ORAL_TABLET | Freq: Three times a day (TID) | ORAL | Status: DC | PRN

## 2021-11-15 MED ORDER — INSULIN ASPART 100 UNIT/ML IJ SOLN: 0.0000 [IU] | Freq: Three times a day (TID) | INTRAMUSCULAR | Status: DC

## 2021-11-15 MED ORDER — OXYCODONE HCL 5 MG PO TABS
5.0000 mg | ORAL_TABLET | Freq: Four times a day (QID) | ORAL | Status: DC | PRN
  Administered 2021-11-15 – 2021-11-17 (×6): 5 mg via ORAL
  Filled 2021-11-15 (×6): qty 1

## 2021-11-15 NOTE — Progress Notes (Signed)
Physical Therapy Treatment ?Patient Details ?Name: [name] [name] ?MRN: [id number] ?DOB: 06/14/1981 ?Today's Date: 11/15/2021 ? ? ?History of Present Illness 41 y.o. female presented via EMS 11/05/20 with AMS. Pt  COVID positive, possible narcotics overdose, given Narcan, found to be hyperammonemia EEG showing diffuse severe encephalopathy. 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 ?  ?PT Comments  ? ? Pt only able to amb 4' before becoming dizzy and having to walk back to bed. Checked BP sitting despite pt feeling like this wasn't the problem. Sitting BP 121/67. Standing BP 92/57 without pt able to continue to stand 3 minutes. Pt requires max encouragement to mobilize and it is difficult to discern her dizziness symptoms. Agree that pt can't tolerate inpatient rehab at this point and doesn't have support at home so updated dc recommendation to SNF.   ?Recommendations for follow up therapy are one component of a multi-disciplinary discharge planning process, led by the attending physician.  Recommendations may be updated based on patient status, additional functional criteria and insurance authorization. ? ?Follow Up Recommendations ? Skilled nursing-short term rehab (<3 hours/day) ?  ?  ?Assistance Recommended at Discharge Frequent or constant Supervision/Assistance  ?Patient can return home with the following A lot of help with walking and/or transfers;Help with stairs or ramp for entrance;Assist for transportation ?  ?Equipment Recommendations ? Rolling walker (2 wheels)  ?  ?Recommendations for Other Services   ? ? ?  ?Precautions  / Restrictions Precautions ?Precautions: Fall;Other (comment) ?Precaution Comments: watch BP  ?  ? ?Mobility ? Bed Mobility ?Overal bed mobility: Modified Independent ?Bed Mobility: Supine to Sit, Sit to Supine ?  ?  ?Supine to sit: Modified independent (Device/Increase time) ?Sit to supine: Modified independent (Device/Increase time) ?  ?  ?  ? ?Transfers ?Overall transfer level: Needs assistance ?Equipment used: Rolling walker (2 wheels) ?Transfers: Sit to/from Stand ?Sit to Stand: Min assist ?  ?  ?  ?  ?  ?General transfer comment: Assist for balance and safety ?  ? ?Ambulation/Gait ?Ambulation/Gait assistance: Mod assist, Min assist ?Gait Distance (Feet): 8 Feet ?Assistive device: Rolling walker (2 wheels), 1 person hand held assist ?Gait Pattern/deviations: Step-to pattern, Decreased step length - right, Decreased step length - left, Shuffle, Knees buckling, Trunk flexed ?Gait velocity: decr ?Gait velocity interpretation: <1.31 ft/sec, indicative of household ambulator ?  ?General Gait Details: Pt amb ~4' around the end of bed with walker and min assist and verbal cues to stay closer to walker. At that point pt let go of walker and began sinking toward floor. Used hand held and mod assist to bring pt more upright and amb back around bed with hand held and pt holding on to side of bed. When questioned pt stated she was dizzy. Checked BP (see assessment). ? ? ?Stairs ?  ?  ?  ?  ?  ? ? ?Wheelchair Mobility ?  ? ?Modified Rankin (Stroke Patients Only) ?  ? ? ?  ?Balance Overall balance assessment: Needs assistance ?Sitting-balance support: No upper extremity supported, Feet supported ?Sitting balance-Leahy Scale: Good ?  ?  ?Standing balance support: Bilateral upper extremity supported, Single extremity supported ?Standing balance-Leahy Scale: Poor ?  Standing balance comment: UE support and min guard for static standing ?  ?  ?  ?  ?  ?  ?  ?  ?  ?  ?  ?  ? ?  ?Cognition Arousal/Alertness: Awake/alert ?Behavior  During Therapy: Flat affect ?  ?Area of Impairment: Safety/judgement, Awareness, Problem solving, Following commands ?  ?  ?  ?  ?  ?  ?  ?  ?  ?  ?  ?Following Commands: Follows one step commands with increased time ?Safety/Judgement: Decreased awareness of safety, Decreased awareness of deficits ?Awareness: Intellectual ?Problem Solving: Slow processing, Requires verbal cues ?  ?  ?  ? ?  ?Exercises   ? ?  ?General Comments General comments (skin integrity, edema, etc.): See assessment or vitals flow sheet for sitting/standing BP ?  ?  ? ?Pertinent Vitals/Pain Pain Assessment ?Pain Assessment: No/denies pain  ? ? ?Home Living   ?  ?  ?  ?  ?  ?  ?  ?  ?  ?   ?  ?Prior Function    ?  ?  ?   ? ?PT Goals (current goals can now be found in the care plan section) Progress towards PT goals: Progressing toward goals ? ?  ?Frequency ? ? ? Min 3X/week ? ? ? ?  ?PT Plan Discharge plan needs to be updated;Frequency needs to be updated  ? ? ?Co-evaluation   ?  ?  ?  ?  ? ?  ?AM-PAC PT "6 Clicks" Mobility   ?Outcome Measure ? Help needed turning from your back to your side while in a flat bed without using bedrails?: None ?Help needed moving from lying on your back to sitting on the side of a flat bed without using bedrails?: A Little ?Help needed moving to and from a bed to a chair (including a wheelchair)?: A Little ?Help needed standing up from a chair using your arms (e.g., wheelchair or bedside chair)?: A Little ?Help needed to walk in hospital room?: Total ?Help needed climbing 3-5 steps with a railing? : Total ?6 Click Score: 15 ? ?  ?End of Session   ?Activity Tolerance: Patient limited by fatigue;Other (comment) (dizziness/lightheadedness) ?Patient left: in bed;with call bell/phone within reach;with bed alarm set ?Nurse Communication: Mobility status ?PT Visit Diagnosis: Other abnormalities of gait and mobility (R26.89);Dizziness and giddiness (R42);Muscle weakness (generalized) (M62.81);Unsteadiness on feet  (R26.81) ?  ? ? ?Time: 1206-1220 ?PT Time Calculation (min) (ACUTE ONLY): 14 min ? ?Charges:  $Gait Training: 8-22 mins          ?          ? ?[facility] PT ?Acute Rehabilitation Services ?Pager [phone] ?Office [phone] ? ? ? ?Shary Decamp [facility] ?11/15/2021, 2:51 PM ? ?

## 2021-11-15 NOTE — Progress Notes (Addendum)
? ? ? [name] ?                                                                            ? ? ?[name] [name], is a 41 y.o. female, DOB - Apr 06, 1981, TML:[id number] ?Admit date - 11/05/2021    ?Outpatient Primary MD for the patient is Patient, No Pcp Per (Inactive) ? ?LOS - 9  days ? ? ? ?Brief summary  ? ?Patient is a 41 year old unfortunate female history of end-stage renal disease on HD on TTS, status post renal and pancreatic transplant, complicated by medical nonadherence and rejection, hypertension, gastroparesis, anxiety, anemia and narcotic abuse presented to the ED with acute onset of altered mental status and confusion.  It was noted that 1 of patient's friends had not heard from her for the last couple of days and EMS called.  When EMS got there patient noted to be conscious but disoriented.  Unknown as to whether patient had a last hemodialysis session.  Patient seen in the ED noted to have pinpoint pupils and altered mental status noted to be somnolent and arousable.  Patient given a dose of IV Narcan which significantly improved her mental status briefly.  Patient seen in the ED COVID-19 PCR obtained was positive.  Influenza PCR negative.  EKG showed normal sinus rhythm and some peaked T waves anterior laterally.  Initial high-sensitivity troponin was 76.  Chest x-ray showed mild pulmonary edema likely secondary to volume overload.  Noncontrast head CT negative. She was transferred to ICU for acute encephalopathy and worsening respiratory status.  ? ? ?Assessment & Plan  ? ? ?Assessment and Plan: ?* Acute toxic and metabolic  encephalopathy ?Acute ischemic stroke  ?Small left frontal sub dural hematoma of unclear etiology possibly coagulopathy.  ?- This is likely multifactorial.  It could be related to COVID encephalopathy as well as uremia with end-stage renal disease on hemodialysis with question about compliance and certainly polypharmacy with opiate overdose, given response to IV Narcan.,  vs from stroke and sub dural hematoma. ?-CT head negative for any acute abnormalities on admission.  ?MRI brain without contrast  on 3/8 showed Small acute infarcts in the posterior paramidline cerebellum bilaterally. She was also found to have anterior left frontal subdural hemorrhage, confirmed by a CT HEAD without contrast.  ?- Neurology on board and Neurosurgery consulted on 3/9. ?LP done and does not appear to be consistent with meningitis, discontinued antivirals and anti biotics.  ?- further stroke work up in progress with echocardiogram showed Left ventricular ejection fraction, by estimation, is 55 to 60%. The left ventricle has normal function. The left ventricle has no regional  wall motion abnormalities. There is severe concentric left ventricular  hypertrophy. Left ventricular diastolic  parameters are consistent with Grade I diastolic dysfunction . ?MRI angio head without contrast showed Negative MRA for large vessel occlusion or other acute finding. ?Age advanced intracranial small vessel atheromatous disease, but no hemodynamically significant. ?hemoglobin A1c  is 3.9% ?Lipid panel shows direct LDL of  60.  ?Unfortunately not a candidate for aspirin or plavix due to hematoma with low platelets.  ?Hypercoagulable work up sent by neurology.  ?Outpatient follow up with  neuro surgery Dr Zada Finders  in 2 weeks and keep platelet count greater than 50,000.  ?Therapy eval recommending CIR, consult in place.  ?No new complaints today.  ? ? ?ESRD on HD with Monday , Wednesday and Friday scheduled.  ?Nephrology on board . Appreciate recommendations.  ? ? ?Pulmonary edema ?- Pulmonary edema noted on chest x-ray likely secondary to fluid overload secondary to end-stage renal disease with probable noncompliance with hemodialysis.   ?- further management as per nephrology.  ?-Nephrology following. ? ?Hypertensive urgency-  ?BP parameters are optimal.  ?Added norvasc 10 mg daily, labetalol 300 mg TID, ( was taking  600 mg BID) and clonidine 0.1 mg BID.  ?On prn hydralazine.   ? 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 ?Start the patient on SSI. Not on insulin at home.  ?CBG (last 3)  ?No results for input(s): GLUCAP in the last 72 hours. ? ? ? ?Gout ?- Colchicine as needed. ? ?History of failed pancreas/ renal transplant ?-She is status post renal and pancreatic transplant with antirejection medication noncompliance. ?- She is currently being evaluated for renal transplant. ?- she was restarted on cyclosporine 200 mg during the day and 100 mg at night, in addition to prednisone 15 mg daily. ?- myfortic restarted.  ?- pt reports that she was asked to stay on 15 mg of prednisone by Dr Moshe Cipro.  ? ?Fever on admission.  ?From CAP vs COVID infection.  ?Afebrile in the last 24 hours.  ?- completed 5 days of IV rocephin.  ? ?COVID-19 virus infection ?- Patient noted to be positive for COVID-19 infection ?-Follow inflammatory markers. ?-Patient chronically on prednisone. ?-completed the course of  molnupiravir. ?-Supportive care. ?- has been 10 days since tested positive , can d/c precautions. She is asymptomatic at this time.  ? ? ?Hyperkalemia ?Resolved with HD.  ? ? Chronic thrombocytopenia:  ?- platelets have been around 120,000 in 02/2021, dropped to 67,000 this admission have to improved to 91,000.  ?-hematology consulted and recommendations given.  ?- no further work up at this time. Watch to see if platelet count improves.  ? ?Anemia of chronic disease from ESRD ?Transfuse to keep hemoglobin greater than 7.  ?Retic count wnl, elevated vitamin B12 and adequate folate levels.  ? ?Pancytopenia: ? Secondary to immunosuppressants.  ?Continue to monitor.  ?Recommend outpatient follow up with Hematology on discharge.  ? ? ?Sepsis present on admission,  ?Pt was hypotensive with BP of 86/62 mmhg, febrile of 101.2 and tachycardic of 104/min and tachypnea of 30/min, and acute  encephalopathy.  ?Sepsis physiology resolved.  ?Completed the course of antibiotics.  ? ? ? ?Estimated body mass index is 20.36 kg/m? as calculated from the following: ?  Height as of this encounter: '5\' 4"'$  (1.626 m). ?  Weight as of this encounter: 53.8 kg. ? ?Code Status: full code.  ?DVT Prophylaxis:  Place and maintain sequential compression device Start: 11/06/21 0347 ? ? ?Level of Care: Level of care: med surg.  ?Family Communication: None at bedside.  ? ?Disposition Plan:     Remains inpatient appropriate:  CIR placement. Pt is medically stable for discharge.  ? ?Procedures:  ?MRI brain.  ? ?Consultants:   ?Neurology ?Nephrology.  ?Neuro surgery.  ? ?Antimicrobials:  ? ?Anti-infectives (From admission, onward)  ? ? Start     Dose/Rate Route Frequency Ordered Stop  ? 11/10/21 1800  acyclovir (ZOVIRAX) 260 mg in dextrose 5 % 100 mL IVPB  Status:  Discontinued       ?  260 mg ?105.2 mL/hr over 60 Minutes Intravenous Every 24 hours 11/09/21 1231 11/10/21 0857  ? 11/09/21 1500  cefTRIAXone (ROCEPHIN) 2 g in sodium chloride 0.9 % 100 mL IVPB       ? 2 g ?200 mL/hr over 30 Minutes Intravenous Every 24 hours 11/09/21 0840 11/11/21 1533  ? 11/09/21 1400  acyclovir (ZOVIRAX) 260 mg in dextrose 5 % 100 mL IVPB       ? 260 mg ?105.2 mL/hr over 60 Minutes Intravenous Once 11/09/21 1231 11/09/21 1455  ? 11/09/21 1200  vancomycin (VANCOREADY) IVPB 500 mg/100 mL  Status:  Discontinued       ? 500 mg ?100 mL/hr over 60 Minutes Intravenous Every M-W-F (Hemodialysis) 11/07/21 0917 11/09/21 0840  ? 11/09/21 0000  piperacillin-tazobactam (ZOSYN) IVPB 2.25 g  Status:  Discontinued       ?See Hyperspace for full Linked Orders Report.  ? 2.25 g ?100 mL/hr over 30 Minutes Intravenous Every 8 hours 11/08/21 1520 11/09/21 0840  ? 11/08/21 1800  acyclovir (ZOVIRAX) 260 mg in dextrose 5 % 100 mL IVPB  Status:  Discontinued       ? 260 mg ?105.2 mL/hr over 60 Minutes Intravenous Every 24 hours 11/08/21 0735 11/09/21 1231  ? 11/08/21 1800   ampicillin (OMNIPEN) 2 g in sodium chloride 0.9 % 100 mL IVPB  Status:  Discontinued       ? 2 g ?300 mL/hr over 20 Minutes Intravenous Every 24 hours 11/08/21 0735 11/08/21 1505  ? 11/08/21 1615  piperacillin-tazo

## 2021-11-15 NOTE — Plan of Care (Signed)
?  Problem: Education: ?Goal: Knowledge of General Education information will improve ?Description: Including pain rating scale, medication(s)/side effects and non-pharmacologic comfort measures ?11/15/2021 0440 by Dorena Cookey, LPN ?Outcome: Progressing ?11/15/2021 0300 by Dorena Cookey, LPN ?Outcome: Progressing ?  ?Problem: Health Behavior/Discharge Planning: ?Goal: Ability to manage health-related needs will improve ?11/15/2021 0440 by Dorena Cookey, LPN ?Outcome: Progressing ?11/15/2021 0300 by Dorena Cookey, LPN ?Outcome: Progressing ?  ?Problem: Clinical Measurements: ?Goal: Ability to maintain clinical measurements within normal limits will improve ?11/15/2021 0440 by Dorena Cookey, LPN ?Outcome: Progressing ?11/15/2021 0300 by Dorena Cookey, LPN ?Outcome: Progressing ?Goal: Will remain free from infection ?11/15/2021 0440 by Dorena Cookey, LPN ?Outcome: Progressing ?11/15/2021 0300 by Dorena Cookey, LPN ?Outcome: Progressing ?Goal: Diagnostic test results will improve ?11/15/2021 0440 by Dorena Cookey, LPN ?Outcome: Progressing ?11/15/2021 0300 by Dorena Cookey, LPN ?Outcome: Progressing ?Goal: Respiratory complications will improve ?11/15/2021 0440 by Dorena Cookey, LPN ?Outcome: Progressing ?11/15/2021 0300 by Dorena Cookey, LPN ?Outcome: Progressing ?Goal: Cardiovascular complication will be avoided ?11/15/2021 0440 by Dorena Cookey, LPN ?Outcome: Progressing ?11/15/2021 0300 by Dorena Cookey, LPN ?Outcome: Progressing ?  ?Problem: Activity: ?Goal: Risk for activity intolerance will decrease ?11/15/2021 0440 by Dorena Cookey, LPN ?Outcome: Progressing ?11/15/2021 0300 by Dorena Cookey, LPN ?Outcome: Progressing ?  ?Problem: Nutrition: ?Goal: Adequate nutrition will be maintained ?11/15/2021 0440 by Dorena Cookey, LPN ?Outcome: Progressing ?11/15/2021 0300 by Dorena Cookey, LPN ?Outcome: Progressing ?  ?Problem: Coping: ?Goal: Level of anxiety will  decrease ?11/15/2021 0440 by Dorena Cookey, LPN ?Outcome: Progressing ?11/15/2021 0300 by Dorena Cookey, LPN ?Outcome: Progressing ?  ?Problem: Elimination: ?Goal: Will not experience complications related to bowel motility ?11/15/2021 0440 by Dorena Cookey, LPN ?Outcome: Progressing ?11/15/2021 0300 by Dorena Cookey, LPN ?Outcome: Progressing ?Goal: Will not experience complications related to urinary retention ?11/15/2021 0440 by Dorena Cookey, LPN ?Outcome: Progressing ?11/15/2021 0300 by Dorena Cookey, LPN ?Outcome: Progressing ?  ?Problem: Pain Managment: ?Goal: General experience of comfort will improve ?11/15/2021 0440 by Dorena Cookey, LPN ?Outcome: Progressing ?11/15/2021 0300 by Dorena Cookey, LPN ?Outcome: Progressing ?  ?Problem: Safety: ?Goal: Ability to remain free from injury will improve ?11/15/2021 0440 by Dorena Cookey, LPN ?Outcome: Progressing ?11/15/2021 0300 by Dorena Cookey, LPN ?Outcome: Progressing ?  ?Problem: Skin Integrity: ?Goal: Risk for impaired skin integrity will decrease ?11/15/2021 0440 by Dorena Cookey, LPN ?Outcome: Progressing ?11/15/2021 0300 by Dorena Cookey, LPN ?Outcome: Progressing ?  ?

## 2021-11-15 NOTE — Progress Notes (Signed)
Subjective: Reports no current complaints, said tolerated dialysis yesterday no UF, currently in bathroom ? ?Objective ?Vital signs in last 24 hours: ?Vitals:  ? 11/14/21 1846 11/14/21 1952 11/15/21 0417 11/15/21 0809  ?BP: (!) 155/74 (!) 140/59 128/67 (!) 143/63  ?Pulse: 78 82 86 83  ?Resp: '16 16 18 17  '$ ?Temp: 97.8 ?F (36.6 ?C) 98.4 ?F (36.9 ?C) 99 ?F (37.2 ?C) 99.4 ?F (37.4 ?C)  ?TempSrc: Temporal Oral Oral Oral  ?SpO2: 99% 99% 95% 93%  ?Weight: 53.9 kg 53.8 kg    ?Height:      ? ?Weight change:  ? ?Physical Exam: ?General: Alert female pleasant, NAD O x3 ?After dressing full COVID precautions noted patient was in bathroom, no exam but reviewed exam by PMD and stable ?  ?OP HD: [facility] MWF ? 3h 14mn  400/500  59kg  2/2 bath  RUE AVF  15g  Hep 2000 ? - hectorol 6 ug tiw IV ? - no esa ?  ?Problem/Plan: ?AMS -now resolved, no seizures by EEG.  Thought to be altered factorial= metabolic due to ^[facility] uremia/ esrd, possible acute SDH.  Also COVID-positive on admission, NSurg evaluating. MS better w/ lactulose and HD. Asterixis resolved.  ?COVID pna - basilar infiltrates on xrays. Viral and/ or bacterial infeciton.  On IV Rocephin. SP molnupiravir. Pt not volume overloaded. COVID precautions should lift on 11/15/21.  ?ESRD - usual HD MWF. Next HD tomorrow on schedule, no heparin HD ?HTN - BP's were low, improved 128/67 this a.m. last week, some hypertension, BP meds resumed. 6kg under dry wt. looking dry, was given NS at 75 cc/hr this weekend , no UF yesterday with dialysis ?H/o failed pancreas/ renal transplant - pancreas still viable taking CyA, pred and myfortic. CyA and myfortic held initially. CyA was resumed at slightly lower dose of '200mg'$  am and '100mg'$  pm (high trough off 309 here). Myfortic was restarted after IV abx were completed on 3/11 at the usual dose . Getting prednisone at 15 qd, this can probably be tapered back down to usual dose 5 mg/ d. Pt not seeing anybody for her pancreas transplant. We will f/u on CyA  levels post dc at the OP HD unit.  ?Small left frontal subdural hematoma unclear etiology possibly coagulopathy work-up per admit /neurology /NS ?MBD ckd -phosphorus 7 3 binders resumed. Ca in range,  Cont IV vdra follow-up calcium phosphorus trend ?Anemia ckd  3/13 Hgb 9.8 was 11.3 follow-up trend if still below 10 5 start  esa  ?Thrombocytopenia= platelets 91 noted heme being consulted, history of chronic low platelets slightly worse since admission ? ?DErnest Haber PA-C ?[facility] ?Beeper [phone]?11/15/2021,12:12 PM ? LOS: 9 days  ? ?Labs: ?Basic Metabolic Panel: ?Recent Labs  ?Lab 11/13/21 ?0037 11/14/21 ?0423 11/15/21 ?0409  ?NA 137 138 137  ?K 3.3* 3.7 3.1*  ?CL 99 100 96*  ?CO2 24 19* 26  ?GLUCOSE 89 77 74  ?BUN 49* 70* 29*  ?CREATININE 6.09* 8.26* 5.38*  ?CALCIUM 8.6* 8.3* 8.5*  ?PHOS 6.2* 7.3* 5.5*  ? ?Liver Function Tests: ?Recent Labs  ?Lab 11/09/21 ?0109 11/10/21 ?0384503/12/23 ?0037 11/14/21 ?0423 11/15/21 ?0409  ?AST 44*  --   --   --   --   ?ALT 25  --   --   --   --   ?ALKPHOS 58  --   --   --   --   ?BILITOT 0.5  --   --   --   --   ?  PROT 7.0  --   --   --   --   ?ALBUMIN 2.9*  2.9*   < > 2.9* 2.7* 2.8*  ? < > = values in this interval not displayed.  ? ?No results for input(s): LIPASE, AMYLASE in the last 168 hours. ?No results for input(s): AMMONIA in the last 168 hours. ?CBC: ?Recent Labs  ?Lab 11/09/21 ?0109 11/10/21 ?2725 11/11/21 ?0225 11/12/21 ?3664 11/14/21 ?0423  ?WBC 5.0 3.2* 3.5* 3.4* 3.8*  ?NEUTROABS 4.5 2.7 2.5 2.4 2.8  ?HGB 13.5 12.8 12.8 11.3* 9.8*  ?HCT 41.2 39.8 37.6 33.3* 29.3*  ?MCV 91.6 92.8 89.3 87.6 89.6  ?PLT 54* 50* 61* 67* 91*  ? ?Cardiac Enzymes: ?No results for input(s): CKTOTAL, CKMB, CKMBINDEX, TROPONINI in the last 168 hours. ?CBG: ?Recent Labs  ?Lab 11/11/21 ?4034 11/11/21 ?1140 11/11/21 ?1632 11/11/21 ?2009 11/12/21 ?7425  ?GLUCAP 103* 125* 162* 128* 94  ? ? ?Studies/Results: ?VAS US CAROTID (at [facility] and [facility] only) ? ?Result Date: 11/14/2021 ?Carotid  Arterial Duplex Study Patient Name:  [name] [name]  Date of Exam:   11/14/2021 Medical Rec #: [id number]           Accession #:    [id number] Date of Birth: 07/07/1981            Patient Gender: F Patient Age:   41 years Exam Location:  [facility] Procedure:      VAS US CAROTID Referring Phys: Amie Portland --------------------------------------------------------------------------------  Indications:       CVA. Risk Factors:      Hypertension, past history of smoking. Other Factors:     ESRD (HD). Comparison Study:  No previous exams Performing Technologist: Jody Hill RVT, RDMS  Examination Guidelines: A complete evaluation includes B-mode imaging, spectral Doppler, color Doppler, and power Doppler as needed of all accessible portions of each vessel. Bilateral testing is considered an integral part of a complete examination. Limited examinations for reoccurring indications may be performed as noted.  Right Carotid Findings: +----------+--------+--------+--------+------------------+------------------+           PSV cm/sEDV cm/sStenosisPlaque DescriptionComments           +----------+--------+--------+--------+------------------+------------------+ CCA Prox  88      6                                 intimal thickening +----------+--------+--------+--------+------------------+------------------+ CCA Distal72      11                                intimal thickening +----------+--------+--------+--------+------------------+------------------+ ICA Prox  39      12                                intimal thickening +----------+--------+--------+--------+------------------+------------------+ ICA Distal66      15                                                   +----------+--------+--------+--------+------------------+------------------+ ECA       83      0                                                     +----------+--------+--------+--------+------------------+------------------+ +----------+--------+-------+--------+-------------------+  PSV cm/sEDV cmsDescribeArm Pressure (mmHG) +----------+--------+-------+--------+-------------------+ Subclavian161     29                                 +----------+--------+-------+--------+-------------------+ +---------+--------+--+--------+-+---------+ VertebralPSV cm/s32EDV cm/s5Antegrade +---------+--------+--+--------+-+---------+  Left Carotid Findings: +----------+--------+--------+--------+---------------------+------------------+           PSV cm/sEDV cm/sStenosisPlaque Description   Comments           +----------+--------+--------+--------+---------------------+------------------+ CCA Prox  120     0                                                       +----------+--------+--------+--------+---------------------+------------------+ CCA Distal64      8                                    intimal thickening +----------+--------+--------+--------+---------------------+------------------+ ICA Prox  48      14                                   intimal thickening +----------+--------+--------+--------+---------------------+------------------+ ICA Distal68      15                                                      +----------+--------+--------+--------+---------------------+------------------+ ECA       106     0               hyperechoic and focalintimal thickening +----------+--------+--------+--------+---------------------+------------------+ +----------+--------+--------+--------+-------------------+           PSV cm/sEDV cm/sDescribeArm Pressure (mmHG) +----------+--------+--------+--------+-------------------+ [id number]                                         +----------+--------+--------+--------+-------------------+ +---------+--------+--+--------+--+---------+ VertebralPSV  cm/s50EDV cm/s13Antegrade +---------+--------+--+--------+--+---------+   Summary: Right Carotid: The extracranial vessels were near-normal with only minimal wall                thickening or plaque. Left Carotid: The extracranial vessels were near-normal with only minimal wall               thickening or plaque. Vertebrals:  Bilateral vertebral arteries demonstrate antegrade flow. Subcl

## 2021-11-15 NOTE — Progress Notes (Signed)
Hematology brief progress note  ? ?I have reviewed her chart and lab results.  Her platelet has increased to 91 today.  She has chronic mild thrombocytopenia, likely related to her chronic renal disease and immunosuppressants, worse on admission probably due to her COVID infection.  She now has developed mild anemia, she had intermittent anemia in the past, no lab evidence of nutritional anemia,  recent B12 and folate level were normal, ferritin was high, no lab evidence of hemolysis, no need additional anemia work-up.  I will defer to her nephrologist to manage her anemia with blood transfusion if needed and ESA if needed.   ? ?I will sign off, please call me if new concerns arise.  ? ?Truitt Merle  ?11/15/2021  ?

## 2021-11-15 NOTE — Progress Notes (Addendum)
Inpatient Rehab Admissions Coordinator:  ? ?Following for my colleague, Gayland Curry.  Unable to confirm 24/7 support at d/c and OT feels more appropriate for SNF.  Would recommend f/u at that level of care.  Will sign off for CIR at this time and let TOC know.   ? ?Addendum 1540: PT and OT now recommending SNF.  Would not be able to get auth for CIR any longer.  Updated TOC team.   ? ?Shann Medal, PT, DPT ?Admissions Coordinator ?[phone] ?11/15/21  ?2:04 PM ? ?

## 2021-11-15 NOTE — Plan of Care (Signed)
?  Problem: Education: Goal: Knowledge of General Education information will improve Description: Including pain rating scale, medication(s)/side effects and non-pharmacologic comfort measures Outcome: Progressing   Problem: Health Behavior/Discharge Planning: Goal: Ability to manage health-related needs will improve Outcome: Progressing   Problem: Clinical Measurements: Goal: Ability to maintain clinical measurements within normal limits will improve Outcome: Progressing Goal: Will remain free from infection Outcome: Progressing Goal: Diagnostic test results will improve Outcome: Progressing Goal: Respiratory complications will improve Outcome: Progressing Goal: Cardiovascular complication will be avoided Outcome: Progressing   Problem: Activity: Goal: Risk for activity intolerance will decrease Outcome: Progressing   Problem: Nutrition: Goal: Adequate nutrition will be maintained Outcome: Progressing   Problem: Coping: Goal: Level of anxiety will decrease Outcome: Progressing   Problem: Elimination: Goal: Will not experience complications related to bowel motility Outcome: Progressing Goal: Will not experience complications related to urinary retention Outcome: Progressing   Problem: Pain Managment: Goal: General experience of comfort will improve Outcome: Progressing   Problem: Safety: Goal: Ability to remain free from injury will improve Outcome: Progressing   Problem: Skin Integrity: Goal: Risk for impaired skin integrity will decrease Outcome: Progressing   Problem: Education: Goal: Knowledge of disease or condition will improve Outcome: Progressing Goal: Knowledge of secondary prevention will improve (SELECT ALL) Outcome: Progressing Goal: Knowledge of patient specific risk factors will improve (INDIVIDUALIZE FOR PATIENT) Outcome: Progressing Goal: Individualized Educational Video(s) Outcome: Progressing   Problem: Coping: Goal: Will verbalize  positive feelings about self Outcome: Progressing Goal: Will identify appropriate support needs Outcome: Progressing   Problem: Health Behavior/Discharge Planning: Goal: Ability to manage health-related needs will improve Outcome: Progressing   Problem: Self-Care: Goal: Ability to participate in self-care as condition permits will improve Outcome: Progressing Goal: Verbalization of feelings and concerns over difficulty with self-care will improve Outcome: Progressing Goal: Ability to communicate needs accurately will improve Outcome: Progressing   Problem: Nutrition: Goal: Risk of aspiration will decrease Outcome: Progressing   Problem: Ischemic Stroke/TIA Tissue Perfusion: Goal: Complications of ischemic stroke/TIA will be minimized Outcome: Progressing   

## 2021-11-15 NOTE — Plan of Care (Signed)
?  Problem: Education: ?Goal: Knowledge of General Education information will improve ?Description: Including pain rating scale, medication(s)/side effects and non-pharmacologic comfort measures ?Outcome: Progressing ?  ?Problem: Health Behavior/Discharge Planning: ?Goal: Ability to manage health-related needs will improve ?Outcome: Progressing ?  ?Problem: Clinical Measurements: ?Goal: Ability to maintain clinical measurements within normal limits will improve ?Outcome: Progressing ?  ?Problem: Clinical Measurements: ?Goal: Ability to maintain clinical measurements within normal limits will improve ?Outcome: Progressing ?Goal: Will remain free from infection ?Outcome: Progressing ?Goal: Diagnostic test results will improve ?Outcome: Progressing ?Goal: Respiratory complications will improve ?Outcome: Progressing ?Goal: Cardiovascular complication will be avoided ?Outcome: Progressing ?  ?Problem: Activity: ?Goal: Risk for activity intolerance will decrease ?Outcome: Progressing ?  ?Problem: Nutrition: ?Goal: Adequate nutrition will be maintained ?Outcome: Progressing ?  ?Problem: Coping: ?Goal: Level of anxiety will decrease ?Outcome: Progressing ?  ?Problem: Elimination: ?Goal: Will not experience complications related to bowel motility ?Outcome: Progressing ?Goal: Will not experience complications related to urinary retention ?Outcome: Progressing ?  ?Problem: Elimination: ?Goal: Will not experience complications related to bowel motility ?Outcome: Progressing ?Goal: Will not experience complications related to urinary retention ?Outcome: Progressing ?  ?Problem: Pain Managment: ?Goal: General experience of comfort will improve ?Outcome: Progressing ?  ?Problem: Safety: ?Goal: Ability to remain free from injury will improve ?Outcome: Progressing ?  ?Problem: Skin Integrity: ?Goal: Risk for impaired skin integrity will decrease ?Outcome: Progressing ?  ?

## 2021-11-16 LAB — CBC
HCT: 28.2 % — ABNORMAL LOW (ref 36.0–46.0)
Hemoglobin: 9.6 g/dL — ABNORMAL LOW (ref 12.0–15.0)
MCH: 29.8 pg (ref 26.0–34.0)
MCHC: 34 g/dL (ref 30.0–36.0)
MCV: 87.6 fL (ref 80.0–100.0)
Platelets: 134 10*3/uL — ABNORMAL LOW (ref 150–400)
RBC: 3.22 MIL/uL — ABNORMAL LOW (ref 3.87–5.11)
RDW: 12.1 % (ref 11.5–15.5)
WBC: 3.8 10*3/uL — ABNORMAL LOW (ref 4.0–10.5)
nRBC: 0 % (ref 0.0–0.2)

## 2021-11-16 LAB — RENAL FUNCTION PANEL
Albumin: 2.9 g/dL — ABNORMAL LOW (ref 3.5–5.0)
Anion gap: 17 — ABNORMAL HIGH (ref 5–15)
BUN: 41 mg/dL — ABNORMAL HIGH (ref 6–20)
CO2: 24 mmol/L (ref 22–32)
Calcium: 8.6 mg/dL — ABNORMAL LOW (ref 8.9–10.3)
Chloride: 95 mmol/L — ABNORMAL LOW (ref 98–111)
Creatinine, Ser: 7.81 mg/dL — ABNORMAL HIGH (ref 0.44–1.00)
GFR, Estimated: 6 mL/min — ABNORMAL LOW (ref >60)
Glucose, Bld: 83 mg/dL (ref 70–99)
Phosphorus: 6.8 mg/dL — ABNORMAL HIGH (ref 2.5–4.6)
Potassium: 3.3 mmol/L — ABNORMAL LOW (ref 3.5–5.1)
Sodium: 136 mmol/L (ref 135–145)

## 2021-11-16 LAB — GLUCOSE, CAPILLARY
Glucose-Capillary: 80 mg/dL (ref 70–99)
Glucose-Capillary: 97 mg/dL (ref 70–99)
Glucose-Capillary: 112 mg/dL — ABNORMAL HIGH (ref 70–99)
Glucose-Capillary: 127 mg/dL — ABNORMAL HIGH (ref 70–99)
Glucose-Capillary: 89 mg/dL (ref 70–99)

## 2021-11-16 MED ORDER — FAMOTIDINE 20 MG PO TABS: 20.0000 mg | ORAL_TABLET | Freq: Two times a day (BID) | ORAL | Status: DC

## 2021-11-16 MED ORDER — FAMOTIDINE 20 MG PO TABS
20.0000 mg | ORAL_TABLET | Freq: Every day | ORAL | Status: DC
  Administered 2021-11-17: 20 mg via ORAL
  Filled 2021-11-16 (×2): qty 1

## 2021-11-16 NOTE — NC FL2 (Signed)
?[place] MEDICAID FL2 LEVEL OF CARE SCREENING TOOL  ?  ? ?IDENTIFICATION  ?Patient Name: ?[name] [name] TIRWE Birthdate: Jan 02, 1981 Sex: female Admission Date (Current Location): ?11/05/2021  ?[place] and [place] Number: ? [place] ?  Facility and Address:  ?[facility] [place], [place] [address], [place], [place] [address] ?     Provider Number: ?[id number]  ?Attending Physician Name and Address:  ?Cherene Altes, MD ? Relative Name and Phone Number:  ?HANGER,CAMERON (Friend)   [phone] ?   ?Current Level of Care: ?Hospital Recommended Level of Care: ?[place] Prior Approval Number: ?  ? ?Date Approved/Denied: ?  PASRR Number: ?[id number] A ? ?Discharge Plan: ?SNF ?  ? ?Current Diagnoses: ?Patient Active Problem List  ? Diagnosis Date Noted  ? Fever 11/07/2021  ? Acute encephalopathy 11/06/2021  ? Pulmonary edema 11/06/2021  ? Hypertensive urgency 11/06/2021  ? Type 2 diabetes mellitus with peripheral neuropathy ([place]) 11/06/2021  ? History of renal transplant 11/06/2021  ? COVID-19 virus infection 11/06/2021  ? HTN (hypertension), malignant 08/02/2021  ? ESRD (end stage renal disease) ([place]) 02/25/2021  ? Coagulation defect, unspecified ([place]) 12/22/2020  ? Acute cough 10/04/2020  ? Allergy, unspecified, initial encounter 05/21/2020  ? Anaphylactic shock, unspecified, initial encounter 05/21/2020  ? Symptomatic anemia 02/27/2020  ? Shortness of breath 04/23/2019  ? Hypocalcemia 12/30/2018  ? Aortic atherosclerosis ([place]) 12/27/2018  ? Acute respiratory failure with hypoxia ([place]) 12/22/2018  ? Hyperkalemia   ? Bilateral pneumonia 12/21/2018  ? Pancreas transplant status ([place]) 12/21/2018  ? Immunosuppression ([place]) 12/21/2018  ? Chest pain 12/21/2018  ? CAP (community acquired pneumonia) 12/20/2018  ? Other staphylococcus as the cause of diseases classified elsewhere 08/16/2018  ? Mild protein-calorie malnutrition ([place]) 04/18/2018  ? Encounter for screening for respiratory tuberculosis  04/06/2018  ? Gout 04/06/2018  ? Iron deficiency anemia, unspecified 04/06/2018  ? Leiomyoma of uterus, unspecified 04/06/2018  ? Secondary hyperparathyroidism of renal origin ([place]) 04/06/2018  ? Acute rejection of kidney transplant 07/11/2017  ? Elevated serum creatinine 06/27/2017  ? E. coli UTI 12/18/2014  ? Transplant rejection 09/03/2014  ? Cyst of ovary, left 09/01/2014  ? Immunosuppressive management encounter following kidney transplant 08/26/2014  ? Acne 04/28/2014  ? Itch of skin 04/28/2014  ? Diabetic gastroparesis associated with type 1 diabetes mellitus ([place]) 11/07/2013  ? Nausea & vomiting 11/07/2013  ? Lesion of spleen 10/07/2013  ? Metabolic acidosis 50/53/9767  ? Patient's noncompliance with other medical treatment and regimen 09/12/2013  ? Narcotic dependence ([place]) 08/31/2013  ? Generalized pain 08/30/2013  ? Hypomagnesemia 08/30/2013  ? Renal hematoma of right transplanted kidney 08/21/2013  ? Hypophosphatemia 07/28/2013  ? End stage renal disease ([place]) 02/06/2012  ? Pleural effusion, bilateral 11/16/2011  ? Anemia in chronic kidney disease (CKD) 11/16/2011  ? Pericardial effusion 11/15/2011  ? Fluid overload 11/15/2011  ? Abdominal pain, epigastric 11/15/2011  ? CKD (chronic kidney disease) stage 5, GFR less than 15 ml/min (HCC) 11/03/2011  ? Hypertension 10/27/2011  ? Renal failure, chronic 10/27/2011  ? MEDIAL EPICONDYLITIS, LEFT 12/17/2007  ? ABSCESS, AXILLA, LEFT 12/03/2007  ? Type 2 diabetes mellitus with other diabetic kidney complication ([place]) 34/19/3790  ? ? ?Orientation RESPIRATION BLADDER Height & Weight   ?  ?Time, Situation, Place, Self ? Normal Continent Weight: 118 lb 9.7 oz (53.8 kg) ?Height:  '5\' 4"'$  (162.6 cm)  ?BEHAVIORAL SYMPTOMS/MOOD NEUROLOGICAL BOWEL NUTRITION STATUS  ?    Continent Diet (see d/c summary)  ?AMBULATORY STATUS COMMUNICATION OF NEEDS  Skin   ?Limited Assist Verbally Normal ?  ?  ?  ?    ?     ?     ? ? ?Personal Care Assistance Level of Assistance  ?Bathing,  Feeding, Dressing Bathing Assistance: Independent ?Feeding assistance: Independent ?Dressing Assistance: Limited assistance ?   ? ?Functional Limitations Info  ?Sight, Hearing, Speech Sight Info: Adequate ?  ?Speech Info: Adequate  ? ? ?SPECIAL CARE FACTORS FREQUENCY  ?OT (By licensed OT), PT (By licensed PT)   ?  ?PT Frequency: 5x/ week ?OT Frequency: 5x/ week ?  ?  ?  ?   ? ? ?Contractures Contractures Info: Not present  ? ? ?Additional Factors Info  ?Code Status, Allergies, Insulin Sliding Scale Code Status Info: Full ?Allergies Info: Iodinated Contrast Media ?  ?Insulin Sliding Scale Info: see d/c med list ?  ?   ? ?Current Medications (11/16/2021):  This is the current hospital active medication list ?Current Facility-Administered Medications  ?Medication Dose Route Frequency Provider Last Rate Last Admin  ? acetaminophen (TYLENOL) tablet 650 mg  650 mg Oral Q6H PRN Hosie Poisson, MD   650 mg at 11/15/21 1830  ? amLODipine (NORVASC) tablet 10 mg  10 mg Oral Daily Hosie Poisson, MD   10 mg at 11/16/21 1104  ? ascorbic acid (VITAMIN C) tablet 500 mg  500 mg Oral Daily Hosie Poisson, MD   500 mg at 11/16/21 1105  ? aspirin EC tablet 81 mg  81 mg Oral Daily Hosie Poisson, MD   81 mg at 11/16/21 1104  ? Chlorhexidine Gluconate Cloth 2 % PADS 6 each  6 each Topical Daily Mansy, Arvella Merles, MD   6 each at 11/16/21 1103  ? cholecalciferol (VITAMIN D3) tablet 1,000 Units  1,000 Units Oral Daily Mansy, Arvella Merles, MD   1,000 Units at 11/16/21 1104  ? cloNIDine (CATAPRES - Dosed in mg/24 hr) patch 0.1 mg  0.1 mg Transdermal Weekly Hosie Poisson, MD   0.1 mg at 11/11/21 1637  ? colchicine tablet 0.6 mg  0.6 mg Oral TID PRN Hosie Poisson, MD   0.6 mg at 11/10/21 1047  ? cycloSPORINE modified (NEORAL) capsule 100 mg  100 mg Oral Q1400 Roney Jaffe, MD   100 mg at 11/15/21 1500  ? cycloSPORINE modified (NEORAL) capsule 200 mg  200 mg Oral Q breakfast Roney Jaffe, MD   200 mg at 11/16/21 3893  ? diphenhydrAMINE (BENADRYL) 12.5  MG/5ML elixir 25 mg  25 mg Oral Q6H PRN Hosie Poisson, MD      ? doxercalciferol (HECTOROL) injection 6 mcg  6 mcg Intravenous Q M,W,F-HD Roney Jaffe, MD   6 mcg at 11/14/21 1657  ? famotidine (PEPCID) tablet 20 mg  20 mg Oral Daily Gaylan Gerold, DO   20 mg at 11/16/21 1106  ? ferric citrate (AURYXIA) tablet 630 mg  630 mg Oral TID WC Noemi Chapel P, DO   630 mg at 11/16/21 0831  ? hydrALAZINE (APRESOLINE) injection 10 mg  10 mg Intravenous Q6H PRN Eugenie Filler, MD   10 mg at 11/10/21 1148  ? insulin aspart (novoLOG) injection 0-6 Units  0-6 Units Subcutaneous TID WC Hosie Poisson, MD      ? labetalol (NORMODYNE) tablet 300 mg  300 mg Oral TID Hosie Poisson, MD   300 mg at 11/16/21 1105  ? lactulose (CHRONULAC) 10 GM/15ML solution 30 g  30 g Oral BID Hosie Poisson, MD   30 g at 11/10/21 2235  ? meclizine (  ANTIVERT) tablet 25 mg  25 mg Oral TID PRN Hosie Poisson, MD   25 mg at 11/15/21 1244  ? metoCLOPramide (REGLAN) tablet 5 mg  5 mg Oral Q6H PRN Hosie Poisson, MD   5 mg at 11/10/21 2256  ? mycophenolate (MYFORTIC) EC tablet 540 mg  540 mg Oral BID Hosie Poisson, MD   540 mg at 11/15/21 2150  ? naloxone [facility]) injection 0.4 mg  0.4 mg Intravenous PRN Kommor, [place], MD   0.4 mg at 11/06/21 1011  ? ondansetron (ZOFRAN) tablet 4 mg  4 mg Oral Q6H PRN Hosie Poisson, MD      ? Or  ? ondansetron (ZOFRAN) injection 4 mg  4 mg Intravenous Q6H PRN Hosie Poisson, MD   4 mg at 11/16/21 1111  ? oxyCODONE (Oxy IR/ROXICODONE) immediate release tablet 5 mg  5 mg Oral Q6H PRN Hosie Poisson, MD   5 mg at 11/16/21 0647  ? predniSONE (DELTASONE) tablet 15 mg  15 mg Oral Q breakfast Gaylan Gerold, DO   15 mg at 11/16/21 0830  ? prochlorperazine (COMPAZINE) injection 10 mg  10 mg Intravenous Q6H PRN Hosie Poisson, MD   10 mg at 11/12/21 1430  ? zinc sulfate capsule 220 mg  220 mg Oral Daily Hosie Poisson, MD   220 mg at 11/16/21 1105  ? ? ? ?Discharge Medications: ?Please see discharge summary for a list of discharge  medications. ? ?Relevant Imaging Results: ? ?Relevant Lab Results: ? ? ?Additional Information ?SSN:  [id number]; No COVID vaccines; 5'4" 118lbs;  HD- [facility] MWF ? ?Paulene Floor Hondo Nanda, LCSWA ? ? ? ? ?

## 2021-11-16 NOTE — Progress Notes (Addendum)
? [name] [name]  PVV:[id number] DOB: 03/15/1981 DOA: 11/05/2021 ?PCP: Patient, No Pcp Per (Inactive)   ? ?Brief Narrative:  ?41 year old with ESRD on HD TTS, renal and pancreatic transplant, medication nonadherence with resultant rejection, HTN, gastroparesis, anxiety, chronic anemia, and narcotic abuse who presented to the ED with the acute onset of confusion.  A friend summoned EMS who found the patient conscious but very disoriented.  In the ED she was found to have pinpoint pupils and she significantly improved with a dose of IV Narcan.  She was also found to be COVID-positive.  She was admitted to the ICU with acute encephalopathy and progressively worsening respiratory status. ? ?Consultants:  ?Nephrology ?Neurology ?Neurosurgery ?Hematology ?PCCM ? ?Code Status: FULL CODE ? ?DVT prophylaxis: ?SCDs ? ?Interim Hx: ?Afebrile.  Vital signs stable. Sedate at time of my visit following pain meds. Appears comfortable and in no distress, though she c/o chest pain broadly across the entire chest, pressure in nature, which she says has been constant and present for "2 to 3 weeks." Denies cp, n/v, or abdom pain.  ? ?Assessment & Plan: ? ?Acute toxic metabolic encephalopathy ?Possibly related to COVID as well as uremia and CVA/SDH with the most significant contributor being polypharmacy/opiate overdose and elevated ammonia - LP without evidence of meningitis -no seizure on EEG -continue lactulose ? ?Acute ischemic stroke ?MRI brain 3/8 noted small acute infarcts posterior paramidline cerebellum bilaterally -stroke work-up per neurology with no clear cause of infarctions -suspect concomitant small vessel disease versus hypercoagulability from COVID - no evidence of large vessel disease -not diabetic -LDL 60  ? ?Small left frontal subdural hematoma ?Anterior left frontal subdural hemorrhage noted initially on MRI 3/8 - unclear etiology -possibly related to coagulopathy -evaluated by neurology and neurosurgery -to  follow-up with [facility] in outpatient setting with plan for repeat CT head in 2 weeks (from 11/11/21) ? ?ESRD on HD ?Nephrology following ? ?Pulmonary edema ?Due to inconsistent hemodialysis -volume management per ongoing dialysis -clinically improved/stable at present ? ?Hypertensive urgency ?Blood pressure now better controlled ? ?DM 1 with peripheral neuropathy ?CBG presently well controlled ? ?Gout ?No clinical evidence of acute flare ? ?History of failed pancreas/renal transplant ?Due to noncompliance with antirejection medication -pancreas deemed still viable therefore immunosuppression medications continue - will need outpatient follow-up ? ?COVID pneumonia ?Bilateral basilar infiltrates noted on initial x-rays - has completed a course of molnupiravir -has completed 10 full days of isolation and therefore isolation has been discontinued ? ?Hyperkalemia ?Resolved with dialysis ? ?Chronic thrombocytopenia ?Has been evaluated by hematology ? ?Anemia of chronic kidney disease ? ? ?Family Communication: No family present at time of exam ?Disposition: SNF for rehab stay -ready for discharge when bed available ? ?Objective: ?Blood pressure (!) 148/72, pulse 84, temperature 99.2 ?F (37.3 ?C), temperature source Oral, resp. rate 17, height '5\' 4"'$  (1.626 m), weight 53.8 kg, SpO2 96 %. ? ?Intake/Output Summary (Last 24 hours) at 11/16/2021 1044 ?Last data filed at 11/16/2021 0900 ?Gross per 24 hour  ?Intake 800 ml  ?Output 0 ml  ?Net 800 ml  ? ?Filed Weights  ? 11/14/21 1506 11/14/21 1846 11/14/21 1952  ?Weight: 53.9 kg 53.9 kg 53.8 kg  ? ? ?Examination: ?General: No acute respiratory distress ?Lungs: Clear to auscultation bilaterally without wheezes or crackles ?Cardiovascular: Regular rate and rhythm without murmur gallop or rub normal S1 and S2 ?Abdomen: Nontender, nondistended, soft, bowel sounds positive, no rebound, no ascites, no appreciable mass ?Extremities: No significant cyanosis, clubbing, or edema bilateral  lower  extremities ? ?CBC: ?Recent Labs  ?Lab 11/11/21 ?0225 11/12/21 ?9163 11/14/21 ?0423  ?WBC 3.5* 3.4* 3.8*  ?NEUTROABS 2.5 2.4 2.8  ?HGB 12.8 11.3* 9.8*  ?HCT 37.6 33.3* 29.3*  ?MCV 89.3 87.6 89.6  ?PLT 61* 67* 91*  ? ?Basic Metabolic Panel: ?Recent Labs  ?Lab 11/14/21 ?0423 11/15/21 ?0409 11/16/21 ?0359  ?NA 138 137 136  ?K 3.7 3.1* 3.3*  ?CL 100 96* 95*  ?CO2 19* 26 24  ?GLUCOSE 77 74 83  ?BUN 70* 29* 41*  ?CREATININE 8.26* 5.38* 7.81*  ?CALCIUM 8.3* 8.5* 8.6*  ?PHOS 7.3* 5.5* 6.8*  ? ?GFR: ?Estimated Creatinine Clearance: 8.2 mL/min (A) (by C-G formula based on SCr of 7.81 mg/dL (H)). ? ?Liver Function Tests: ?Recent Labs  ?Lab 11/13/21 ?8466 11/14/21 ?0423 11/15/21 ?5993 11/16/21 ?0359  ?ALBUMIN 2.9* 2.7* 2.8* 2.9*  ? ? ?HbA1C: ?Hgb A1c MFr Bld  ?Date/Time Value Ref Range Status  ?11/11/2021 02:25 AM 4.1 (L) 4.8 - 5.6 % Final  ?  Comment:  ?  (NOTE) ?Pre diabetes:          5.7%-6.4% ? ?Diabetes:              >6.4% ? ?Glycemic control for   <7.0% ?adults with diabetes ?  ?11/06/2021 12:15 PM 3.9 (L) 4.8 - 5.6 % Final  ?  Comment:  ?  (NOTE) ?Pre diabetes:          5.7%-6.4% ? ?Diabetes:              >6.4% ? ?Glycemic control for   <7.0% ?adults with diabetes ?  ? ? ?CBG: ?Recent Labs  ?Lab 11/15/21 ?1306 11/15/21 ?1617 11/15/21 ?2046 11/16/21 ?0636 11/16/21 ?0737  ?GLUCAP 93 92 133* 80 97  ? ? ?Scheduled Meds: ? amLODipine  10 mg Oral Daily  ? vitamin C  500 mg Oral Daily  ? aspirin EC  81 mg Oral Daily  ? Chlorhexidine Gluconate Cloth  6 each Topical Daily  ? cholecalciferol  1,000 Units Oral Daily  ? cloNIDine  0.1 mg Transdermal Weekly  ? cycloSPORINE modified  100 mg Oral Q1400  ? cycloSPORINE modified  200 mg Oral Q breakfast  ? doxercalciferol  6 mcg Intravenous Q M,W,F-HD  ? famotidine  20 mg Oral Daily  ? ferric citrate  630 mg Oral TID WC  ? insulin aspart  0-6 Units Subcutaneous TID WC  ? labetalol  300 mg Oral TID  ? lactulose  30 g Oral BID  ? mycophenolate  540 mg Oral BID  ? predniSONE  15 mg Oral Q  breakfast  ? zinc sulfate  220 mg Oral Daily  ? ? ? LOS: 10 days  ? ?Cherene Altes, MD ?[name] ?Office  [phone] ?Pager - Text Page per Shea Evans ? ?If 7PM-7AM, please contact night-coverage per Amion ?11/16/2021, 10:44 AM ? ? ? ?

## 2021-11-16 NOTE — Progress Notes (Signed)
Occupational Therapy Treatment ?Patient Details ?Name: [name] [name] ?MRN: [id number] ?DOB: 02-24-1981 ?Today's Date: 11/16/2021 ? ? ?History of present illness 41 y.o. female presented via EMS 11/05/20 with AMS. Pt  COVID positive, possible narcotics overdose, given Narcan, found to be hyperammonemia EEG showing diffuse severe encephalopathy. 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  ?OT comments ? Pt limited by dizziness, fatigue, this session. Also reports chest hurting (RN aware and messaged MD). Pt mod I for bed mobility, initially attempting to lay back down immediately - but able to engage in grooming activities while seated EOB. Pt stood with RW and min A, small side steps up the bed, and then returned supine. POC for SNF post-acute remains appropriate and OT will continue to follow acutely and attempt to engage in meaningful occupations while increasing activity tolerance, balance, and independence in ADL.   ? ?Recommendations for follow up therapy are one component of a multi-disciplinary discharge planning process, led by the attending physician.  Recommendations may be updated based on patient status, additional functional criteria and insurance authorization. ?   ?Follow Up Recommendations ? Skilled nursing-short term rehab (<3 hours/day)  ?  ?Assistance Recommended at Discharge Frequent or constant Supervision/Assistance  ?Patient can return home with the following ? A little help with walking and/or transfers;A little help with bathing/dressing/bathroom;Assistance with cooking/housework;Direct supervision/assist for  financial management;Direct supervision/assist for medications management ?  ?Equipment Recommendations ? Tub/shower seat;BSC/3in1  ?  ?Recommendations for Other Services   ? ?  ?Precautions / Restrictions Precautions ?Precautions: Fall;Other (comment) ?Precaution Comments: watch BP; COVID precautions ?Restrictions ?Weight Bearing Restrictions: No  ? ? ?  ? ?Mobility Bed Mobility ?Overal bed mobility: Modified Independent ?Bed Mobility: Supine to Sit, Sit to Supine ?  ?  ?Supine to sit: Modified independent (Device/Increase time) ?Sit to supine: Modified independent (Device/Increase time) ?  ?General bed mobility comments: Pt able to transfer to EOB and to supine but would not maintain sitting up for more than 1-2 mins. ?  ? ?Transfers ?Overall transfer level: Needs assistance ?Equipment used: Rolling walker (2 wheels) ?Transfers: Sit to/from Stand ?Sit to Stand: Min assist ?  ?  ?  ?  ?  ?General transfer comment: Assist for balance and safety, unable to maintain due to decreased activity tolerance ?  ?  ?Balance Overall balance assessment: Needs assistance ?Sitting-balance support: No upper extremity supported, Feet supported ?Sitting balance-Leahy Scale: Fair ?Sitting balance - Comments: initially attempted to lay back down ?  ?Standing balance support: Bilateral upper extremity supported, Reliant on assistive device for balance ?Standing balance-Leahy Scale: Poor ?Standing balance comment: UE support and min guard for static standing ?  ?  ?  ?  ?  ?  ?  ?  ?  ?  ?  ?   ? ?ADL either performed or assessed with clinical judgement  ? ?ADL Overall ADL's : Needs assistance/impaired ?  ?  ?Grooming: Wash/dry hands;Wash/dry face;Oral care;Set up;Sitting ?Grooming Details (indicate cue type and reason): EOB ?  ?  ?  ?  ?Upper Body Dressing : Minimal assistance;Sitting ?Upper Body Dressing Details (indicate cue type and reason): to don extra gown like robe ?Lower Body Dressing:  Min guard;Sitting/lateral leans ?Lower  Body Dressing Details (indicate cue type and reason): for donning socks, more support needed for items that require sit<>stand ?Toilet Transfer: Minimal assistance;Rolling walker (2 wheels) ?  ?  ?  ?  ?  ?  ?General ADL Comments: decreased activity tolerance, balance, dizziness, unable to maintain standing for ADL - requires sitting for safety and tolerance level ?  ? ?Extremity/Trunk Assessment   ?  ?  ?  ?  ?  ? ?Vision   ?  ?  ?Perception   ?  ?Praxis   ?  ? ?Cognition Arousal/Alertness: Awake/alert ?Behavior During Therapy: Flat affect ?Overall Cognitive Status: Impaired/Different from baseline ?Area of Impairment: Safety/judgement, Awareness, Problem solving, Following commands ?  ?  ?  ?  ?  ?  ?  ?  ?  ?  ?  ?Following Commands: Follows one step commands with increased time ?Safety/Judgement: Decreased awareness of safety, Decreased awareness of deficits ?Awareness: Intellectual ?Problem Solving: Slow processing, Requires verbal cues ?General Comments: Pt requires significant encouragement to participate in therapy ?  ?  ?   ?Exercises   ? ?  ?Shoulder Instructions   ? ? ?  ?General Comments    ? ? ?Pertinent Vitals/ Pain       Pain Assessment ?Pain Assessment: Faces ?Faces Pain Scale: Hurts a little bit ?Pain Location: chest ?Pain Descriptors / Indicators: Discomfort ?Pain Intervention(s): Monitored during session, Repositioned ? ?Home Living   ?  ?  ?  ?  ?  ?  ?  ?  ?  ?  ?  ?  ?  ?  ?  ?  ?  ?  ? ?  ?Prior Functioning/Environment    ?  ?  ?  ?   ? ?Frequency ? Min 2X/week  ? ? ? ? ?  ?Progress Toward Goals ? ?OT Goals(current goals can now be found in the care plan section) ? Progress towards OT goals: Not progressing toward goals - comment (limited by dizziness) ? ?Acute Rehab OT Goals ?Patient Stated Goal: none stated during session ?Time For Goal Achievement: 11/28/21 ?Potential to Achieve Goals: Fair  ?Plan Discharge plan remains appropriate;Frequency remains appropriate   ? ?Co-evaluation ? ? ?   ?   ?  ?  ?  ? ?  ?AM-PAC OT "6 Clicks" Daily Activity     ?Outcome Measure ? ? Help from another person eating meals?: None ?Help from another person taking care of personal grooming?: A Little ?Help from another person toileting, which includes using toliet, bedpan, or urinal?: A Little ?Help from another person bathing (including washing, rinsing, drying)?: A Little ?Help from another person to put on and taking off regular upper body clothing?: A Little ?Help from another person to put on and taking off regular lower body clothing?: A Little ?6 Click Score: 19 ? ?  ?End of Session Equipment Utilized During Treatment: Gait belt;Rolling walker (2 wheels) ? ?OT Visit Diagnosis: Unsteadiness on feet (R26.81);Muscle weakness (generalized) (M62.81);Dizziness and giddiness (R42) ?  ?Activity Tolerance Patient limited by fatigue;Other (comment) (dizziness) ?  ?Patient Left in bed;with call bell/phone within reach;with bed alarm set ?  ?Nurse Communication Mobility status ?  ? ?   ? ?Time: 9741-6384 ?OT Time Calculation (min): 27 min ? ?Charges: OT General Charges ?$OT Visit: 1 Visit ?OT Treatments ?$Self Care/Home Management : 23-37 mins ? ?Jesse Sans OTR/L ?Acute Rehabilitation Services ?Pager: [phone] ?Office: [phone] ? ? ?[name] [name] ?11/16/2021, 1:42 PM ?

## 2021-11-16 NOTE — Progress Notes (Signed)
Speech Language Pathology Treatment: Cognitive-Linquistic  ?Patient Details ?Name: [name] [name] ?MRN: [id number] ?DOB: 10-30-1980 ?Today's Date: 11/16/2021 ?Time: 0037-0488 ?SLP Time Calculation (min) (ACUTE ONLY): 10 min ? ?Assessment / Plan / Recommendation ?Clinical Impression ? Patient seen by SLP for skilled session focused on cognitive function. Patient was awake, alert, lying in bed and RN in room setting up dialysis. Patient was oriented to place, situation, time and able to demonstrate basic level recall of daily events (medical, therapeutic). Voice was low in intensity but intelligible. Overall, patient can be slow to respond and appears overall weak and drowsy. She reported that she has not been eating much of the food as she does not like it. When asked what she normally likes to eat she reported "fruits....fresh fruits". Dinner tray in room but patient declined. Session was brief as patient then was getting hooked up for dialysis. As previous SLP noted that patient's cognition on initial evaluation may have been impacted by patient's complaint of 10/10 pain leading to decreased attention, SLP is recommending reassessment of patient's cognitive function during next visit. ? ?  ?HPI HPI: 41 y.o. female presented via EMS 11/05/20 with AMS. Pt  COVID positive, possible narcotics overdose, given Narcan, found to be hyperammonemia EEG showing diffuse severe encephalopathy. 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  ?   ?SLP Plan ? Continue with current plan of care ? ?  ?  ?Recommendations for follow up therapy are one  component of a multi-disciplinary discharge planning process, led by the attending physician.  Recommendations may be updated based on patient status, additional functional criteria and insurance authorization. ?  ? ?Recommendations  ?   ?   ?    ?   ? ? ? ? Follow Up Recommendations: Acute inpatient rehab (3hours/day) ?Assistance recommended at discharge: Intermittent Supervision/Assistance ?SLP Visit Diagnosis: Cognitive communication deficit (R41.841) ?Plan: Continue with current plan of care ? ? ? ? ?  ?[name] Baller, MA, CCC-SLP ?Speech Therapy ? ?

## 2021-11-16 NOTE — TOC Initial Note (Signed)
Transition of Care [facility]) - Initial/Assessment Note    Patient Details  Name: [name] [name] MRN: [id number] Date of Birth: 04-Jun-1981  Transition of Care [facility]) CM/SW Contact:    Ralene Bathe, LCSWA Phone Number: 11/16/2021, 11:23 AM  Clinical Narrative:                 CSW received consult for possible SNF placement at time of discharge. CSW spoke with patient at bedside. Patient expressed understanding of PT recommendation and is agreeable to SNF placement at time of discharge. Patient reports preference for a facility in [place]. CSW discussed insurance authorization process and will provide Medicare SNF ratings list. Patient has received 0 COVID vaccines. CSW will send out referrals for review. No further questions reported at this time.   Skilled Nursing Rehab Facilities-   [email or web address]   Ratings out of 5 possible   Name Address  Phone # Quality Care Staffing Health Inspection Overall  [facility] [address], [place] [phone] 5 5 2 4   [facility]  [address] [place], [place] [phone] 4 2 5 5   [facility] [address] [place], [place] [phone] 4 1 1 1   [facility] [address] [phone] 2 2 4 4   [facility] [address], [place] [phone] 1 1 2 1   [facility] [phone] [address], [place] [phone] 2 1 4 3   [facility] [address] [place] [phone] 5 2 2 3   [facility] [address], [place] [phone] 5 2 2 3   [address] ([facility]) [address], [place] [phone] 5 1 2 2   [facility] [address], [place] [phone] 4 1 1 1   [facility] [address], [facility] [phone] 4 1 2 1   [facility]) [address], [place] [phone] 4 1 1 1           [facility] [address], [place] [phone]      [facility] [address] [place] [phone] 4 2 3 3   [facility] [address], [place] Ditto [phone] [address], [place] [address] [name] [address], [place] [phone] 2 1 1 1   [facility] [address] [employer] [phone] 2 2 3 3           [address] (no [facility]) [address], [place] [phone] 4 5 5 5   [facility] (No [employer]) [address] [place], [place] [phone] 4 1 4 3   [facility] (No [employer]) [address], [place] [phone] 5 5 5 5   [facility] [address] [place] [phone] 3 2 4 4   [facility] [address] [place] [phone] 3 3 4 4   [facility] [address] [place] [phone] 1 1 2 1   [facility] [address], [place] [phone] 2 1 1 1   [facility] [address] [place] [phone] 5 2 4 5   [facility] [address] [place] [phone] 3 1 1 1   [facility] [address] [place], [place] [phone] 2 1 2 1           [facility] [address], [place] [phone] 1 1 1 1   [facility] [address], [place] Clines  [phone] 2 4 2 2   [facility] [address], [place] [phone] 5 2 3 4   [facility] [place] [address] [place] [phone] 2 1 1  1  [facility] (No Humana) [address], [place] [phone] 2 1 3 2   [facility] [address], [place] [phone] 3 1 1 1           [facility] [address] [place], [place] [phone] 5 4 5 5   [facility])  [address] [place] [phone] 2 2 3 3   [facility]) [address] [place] [phone] 3 2 4 4   [facility] [address] [place] [phone] 4 3 4 4   [address] [place], [place] [phone] 3 3 1 1   [facility]) [address] [place] [phone] 2 2 4 4      Expected Discharge Plan: Skilled Nursing Facility Barriers  to Discharge: SNF Pending bed offer, Continued Medical Work up, Insurance Authorization   Patient Goals and CMS Choice Patient states their goals for this hospitalization and ongoing recovery are:: To return home CMS Medicare.gov Compare Post Acute Care list provided to:: Patient    Expected Discharge Plan and Services Expected Discharge Plan: Skilled Nursing Facility   Discharge Planning Services: CM Consult Post Acute Care Choice: Home Health Living arrangements for the past 2 months: Single Family Home                 DME Arranged: Walker rolling DME Agency: [employer] Date DME Agency Contacted: 11/11/21 Time DME Agency Contacted: [phone] Representative spoke with at DME Agency: Velna Hatchet HH Arranged: PT, OT, RN HH Agency: [facility] Date [facility] Agency Contacted: 11/11/21 Time HH Agency Contacted: 1807 Representative spoke with at [facility] [place] Agency: Candise Bowens  Prior Living Arrangements/Services Living arrangements for the past 2 months: Single Family Home Lives with:: Self Patient language and need for interpreter reviewed:: Yes        Need for Family Participation in Patient Care: Yes (Comment) Care giver support system in place?: Yes (comment)   Criminal Activity/Legal Involvement Pertinent to Current Situation/Hospitalization: No - Comment as needed  Activities of Daily Living      Permission Sought/Granted Permission sought to share information with : Case Manager, Magazine features editor, Family Supports Permission granted to share information with : Yes, Verbal Permission Granted              Emotional Assessment Appearance:: Appears stated age Attitude/Demeanor/Rapport: Other (comment) Affect (typically observed): Other (comment) Orientation: : Oriented to Self, Oriented to Place Alcohol / Substance Use: Other (comment) (Narcotic abuse) Psych Involvement: No (comment)  Admission diagnosis:  Uremia [N19] Acute encephalopathy [G93.40] Patient Active Problem  List   Diagnosis Date Noted   Fever 11/07/2021   Acute encephalopathy 11/06/2021   Pulmonary edema 11/06/2021   Hypertensive urgency 11/06/2021   Type 2 diabetes mellitus with peripheral neuropathy (HCC) 11/06/2021   History of renal transplant 11/06/2021   COVID-19 virus infection 11/06/2021   HTN (hypertension), malignant 08/02/2021   ESRD (end stage renal disease) (HCC) 02/25/2021   Coagulation defect, unspecified (HCC) 12/22/2020   Acute cough 10/04/2020   Allergy, unspecified, initial encounter 05/21/2020   Anaphylactic shock, unspecified, initial encounter 05/21/2020   Symptomatic anemia 02/27/2020   Shortness of breath 04/23/2019   Hypocalcemia 12/30/2018   Aortic atherosclerosis (HCC) 12/27/2018   Acute respiratory failure with hypoxia (HCC) 12/22/2018   Hyperkalemia    Bilateral pneumonia 12/21/2018   Pancreas transplant status (HCC) 12/21/2018   Immunosuppression (HCC) 12/21/2018   Chest pain 12/21/2018   CAP (community acquired pneumonia) 12/20/2018   Other staphylococcus as the  cause of diseases classified elsewhere 08/16/2018   Mild protein-calorie malnutrition (HCC) 04/18/2018   Encounter for screening for respiratory tuberculosis 04/06/2018   Gout 04/06/2018   Iron deficiency anemia, unspecified 04/06/2018   Leiomyoma of uterus, unspecified 04/06/2018   Secondary hyperparathyroidism of renal origin (HCC) 04/06/2018   Acute rejection of kidney transplant 07/11/2017   Elevated serum creatinine 06/27/2017   E. coli UTI 12/18/2014   Transplant rejection 09/03/2014   Cyst of ovary, left 09/01/2014   Immunosuppressive management encounter following kidney transplant 08/26/2014   Acne 04/28/2014   Itch of skin 04/28/2014   Diabetic gastroparesis associated with type 1 diabetes mellitus (HCC) 11/07/2013   Nausea & vomiting 11/07/2013   Lesion of spleen 10/07/2013   Metabolic acidosis 09/25/2013   Patient's noncompliance with other medical treatment and regimen  09/12/2013   Narcotic dependence (HCC) 08/31/2013   Generalized pain 08/30/2013   Hypomagnesemia 08/30/2013   Renal hematoma of right transplanted kidney 08/21/2013   Hypophosphatemia 07/28/2013   End stage renal disease (HCC) 02/06/2012   Pleural effusion, bilateral 11/16/2011   Anemia in chronic kidney disease (CKD) 11/16/2011   Pericardial effusion 11/15/2011   Fluid overload 11/15/2011   Abdominal pain, epigastric 11/15/2011   CKD (chronic kidney disease) stage 5, GFR less than 15 ml/min (HCC) 11/03/2011   Hypertension 10/27/2011   Renal failure, chronic 10/27/2011   MEDIAL EPICONDYLITIS, LEFT 12/17/2007   ABSCESS, AXILLA, LEFT 12/03/2007   Type 2 diabetes mellitus with other diabetic kidney complication (HCC) 11/05/2007   PCP:  Patient, No Pcp Per (Inactive) Pharmacy:   [facility] [place] - [address] [employer], [address] [place] [address] Phone: [phone] Fax: [phone]  [id number] [place], [place] - [address] [name] [address] Phone: [phone] Fax: [phone]     Social Determinants of Health (SDOH) Interventions    Readmission Risk Interventions No flowsheet data found.

## 2021-11-16 NOTE — Progress Notes (Signed)
Pt. Complaining of non specific chest pain.  Patient says she has had it for weeks but not previously complained about it to me today nor do I see pain meds given for it.  Thereasa Solo, MD made aware.  Will continue to monitor for now without any specific testing ordered per his instruction. ?

## 2021-11-16 NOTE — Progress Notes (Addendum)
OP HD: AF MWF ? 3h 50mn  400/500  59kg (needs new lower EDW)  2/2 bath  RUE AVF  15g  Hep 2000 ? - hectorol 6 ug tiw IV ? - no esa ?  ?Problem/Plan: ?AMS -now resolved, no seizures by EEG.  Thought to be altered factorial= metabolic due to ^[facility] uremia/ esrd, possible acute SDH.  Also COVID-positive on admission, NSurg evaluating. MS better w/ lactulose and HD. Asterixis resolved.  ?COVID pna - basilar infiltrates on xrays. Viral and/ or bacterial infeciton.  On IV Rocephin. SP molnupiravir. Pt not volume overloaded. COVID precautions lifted on 11/15/21; relatively asymp at this point other than fatigue.  ?ESRD - usual HD MWF. Last HD on 3/13 Next HD today on schedule, no heparin HD.  Needs new lower EDW upon d/c. ?HTN - BP's were low but BP meds resumed. 6kg under dry wt. looking dry, was given NS at 75 cc/hr this weekend , no UF yesterday with dialysis ?H/o failed pancreas/ renal transplant - pancreas still viable taking CyA, pred and myfortic. CyA and myfortic held initially. CyA was resumed at slightly lower dose of '200mg'$  am and '100mg'$  pm (high trough off 309 here). Myfortic was restarted after IV abx were completed on 3/11 at the usual dose . Getting prednisone at 15 qd, this can probably be tapered back down to usual dose 5 mg/ d. Pt not seeing anybody for her pancreas transplant. We will f/u on CyA levels post dc at the OP HD unit.  ?Small left frontal subdural hematoma unclear etiology possibly coagulopathy work-up per admit /neurology /NS ?MBD ckd -phosphorus 7 3 binders resumed. Ca in range,  Cont IV vdra follow-up calcium phosphorus trend ?Anemia ckd  3/13 Hgb 9.8 was 11.3 follow-up trend if still below 10 5 start  esa  ?Thrombocytopenia= platelets 91 noted heme being consulted -> slowly incr trend thought likely to be from the infx. ? ?Subjective: Reports no current complaints other than fatigue. She sat in the chair yest and felt exhausted afterwards. ? ?Objective ?Vital signs in last 24 hours: ?Vitals:   ? 11/15/21 0809 11/15/21 2001 11/16/21 0417 11/16/21 0739  ?BP: (!) 143/63 134/79 (!) 153/72 (!) 148/72  ?Pulse: 83 81 79 84  ?Resp: '17 17 18 17  '$ ?Temp: 99.4 ?F (37.4 ?C) 99.4 ?F (37.4 ?C) 98.3 ?F (36.8 ?C) 99.2 ?F (37.3 ?C)  ?TempSrc: Oral Oral  Oral  ?SpO2: 93% 98% 98% 96%  ?Weight:      ?Height:      ? ?Weight change:  ? ?Physical Exam: ?GEN: NAD, A&Ox3, NCAT ?HEENT: No conjunctival pallor, EOMI ?NECK: Supple, no thyromegaly ?LUNGS: CTA B/L no rales, rhonchi or wheezing ?CV: RRR, No M/R/G ?ABD: SNDNT +BS  ?EXT: No lower extremity edema ?ACCESS: +thrill right upper arm fistula ?  ? ? ?Labs: ?Basic Metabolic Panel: ?Recent Labs  ?Lab 11/14/21 ?0423 11/15/21 ?0409 11/16/21 ?0359  ?NA 138 137 136  ?K 3.7 3.1* 3.3*  ?CL 100 96* 95*  ?CO2 19* 26 24  ?GLUCOSE 77 74 83  ?BUN 70* 29* 41*  ?CREATININE 8.26* 5.38* 7.81*  ?CALCIUM 8.3* 8.5* 8.6*  ?PHOS 7.3* 5.5* 6.8*  ? ?Liver Function Tests: ?Recent Labs  ?Lab 11/14/21 ?0423 11/15/21 ?0409 11/16/21 ?0359  ?ALBUMIN 2.7* 2.8* 2.9*  ? ?No results for input(s): LIPASE, AMYLASE in the last 168 hours. ?No results for input(s): AMMONIA in the last 168 hours. ?CBC: ?Recent Labs  ?Lab 11/10/21 ?0220203/10/23 ?0225 11/12/21 ?0542703/13/23 ?0423  ?WBC 3.2*  3.5* 3.4* 3.8*  ?NEUTROABS 2.7 2.5 2.4 2.8  ?HGB 12.8 12.8 11.3* 9.8*  ?HCT 39.8 37.6 33.3* 29.3*  ?MCV 92.8 89.3 87.6 89.6  ?PLT 50* 61* 67* 91*  ? ?Cardiac Enzymes: ?No results for input(s): CKTOTAL, CKMB, CKMBINDEX, TROPONINI in the last 168 hours. ?CBG: ?Recent Labs  ?Lab 11/15/21 ?1306 11/15/21 ?1617 11/15/21 ?2046 11/16/21 ?0636 11/16/21 ?0737  ?GLUCAP 93 92 133* 80 97  ? ? ?Studies/Results: ?VAS US CAROTID (at [facility] and [facility] only) ? ?Result Date: 11/14/2021 ?Carotid Arterial Duplex Study Patient Name:  [name] [name]  Date of Exam:   11/14/2021 Medical Rec #: [id number]           Accession #:    [id number] Date of Birth: Mar 24, 1981            Patient Gender: F Patient Age:   41 years [facility]am [facility] Procedure:       VAS US CAROTID Referring Phys: Amie [place] --------------------------------------------------------------------------------  Indications:       CVA. Risk Factors:      Hypertension, past history of smoking. Other Factors:     ESRD (HD). Comparison Study:  No previous exams Performing Technologist: Jody Hill RVT, RDMS  Examination Guidelines: A complete evaluation includes B-mode imaging, spectral Doppler, color Doppler, and power Doppler as needed of all accessible portions of each vessel. Bilateral testing is considered an integral part of a complete examination. Limited examinations for reoccurring indications may be performed as noted.  Right Carotid Findings: +----------+--------+--------+--------+------------------+------------------+           PSV cm/sEDV cm/sStenosisPlaque DescriptionComments           +----------+--------+--------+--------+------------------+------------------+ CCA Prox  88      6                                 intimal thickening +----------+--------+--------+--------+------------------+------------------+ CCA Distal72      11                                intimal thickening +----------+--------+--------+--------+------------------+------------------+ ICA Prox  39      12                                intimal thickening +----------+--------+--------+--------+------------------+------------------+ ICA Distal66      15                                                   +----------+--------+--------+--------+------------------+------------------+ ECA       83      0                                                    +----------+--------+--------+--------+------------------+------------------+ +----------+--------+-------+--------+-------------------+           PSV cm/sEDV cmsDescribeArm Pressure (mmHG) +----------+--------+-------+--------+-------------------+ Subclavian161     29                                  +----------+--------+-------+--------+-------------------+ +---------+--------+--+--------+-+---------+  VertebralPSV cm/s32EDV cm/s5Antegrade +---------+--------+--+--------+-+---------+  Left Carotid Findings: +----------+--------+--------+--------+---------------------+------------------+           PSV cm/sEDV cm/sStenosisPlaque Description   Comments           +----------+--------+--------+--------+---------------------+------------------+ CCA Prox  120     0                                                       +----------+--------+--------+--------+---------------------+------------------+ CCA Distal64      8                                    intimal thickening +----------+--------+--------+--------+---------------------+------------------+ ICA Prox  48      14                                   intimal thickening +----------+--------+--------+--------+---------------------+------------------+ ICA Distal68      15                                                      +----------+--------+--------+--------+---------------------+------------------+ ECA       106     0               hyperechoic and focalintimal thickening +----------+--------+--------+--------+---------------------+------------------+ +----------+--------+--------+--------+-------------------+           PSV cm/sEDV cm/sDescribeArm Pressure (mmHG) +----------+--------+--------+--------+-------------------+ [id number]                                         +----------+--------+--------+--------+-------------------+ +---------+--------+--+--------+--+---------+ VertebralPSV cm/s50EDV cm/s13Antegrade +---------+--------+--+--------+--+---------+   Summary: Right Carotid: The extracranial vessels were near-normal with only minimal wall                thickening or plaque. Left Carotid: The extracranial vessels were near-normal with only minimal wall               thickening or  plaque. Vertebrals:  Bilateral vertebral arteries demonstrate antegrade flow. Subclavians: Normal flow hemodynamics were seen in bilateral subclavian              arteries. *See table(s) above for measurements and observations.  Electronically signed by Rosalin Hawking MD on 11/14/2021 at 7:37:24 PM.    Final    ?Medications: ? ? amLODipine  10 mg Oral

## 2021-11-17 LAB — CBC
HCT: 27 % — ABNORMAL LOW (ref 36.0–46.0)
Hemoglobin: 9.2 g/dL — ABNORMAL LOW (ref 12.0–15.0)
MCH: 30 pg (ref 26.0–34.0)
MCHC: 34.1 g/dL (ref 30.0–36.0)
MCV: 87.9 fL (ref 80.0–100.0)
Platelets: 129 10*3/uL — ABNORMAL LOW (ref 150–400)
RBC: 3.07 MIL/uL — ABNORMAL LOW (ref 3.87–5.11)
RDW: 12.2 % (ref 11.5–15.5)
WBC: 3.7 10*3/uL — ABNORMAL LOW (ref 4.0–10.5)
nRBC: 0 % (ref 0.0–0.2)

## 2021-11-17 LAB — RENAL FUNCTION PANEL
Albumin: 2.7 g/dL — ABNORMAL LOW (ref 3.5–5.0)
Anion gap: 12 (ref 5–15)
BUN: 22 mg/dL — ABNORMAL HIGH (ref 6–20)
CO2: 27 mmol/L (ref 22–32)
Calcium: 8.5 mg/dL — ABNORMAL LOW (ref 8.9–10.3)
Chloride: 96 mmol/L — ABNORMAL LOW (ref 98–111)
Creatinine, Ser: 5.58 mg/dL — ABNORMAL HIGH (ref 0.44–1.00)
GFR, Estimated: 9 mL/min — ABNORMAL LOW (ref >60)
Glucose, Bld: 100 mg/dL — ABNORMAL HIGH (ref 70–99)
Phosphorus: 5.1 mg/dL — ABNORMAL HIGH (ref 2.5–4.6)
Potassium: 3.3 mmol/L — ABNORMAL LOW (ref 3.5–5.1)
Sodium: 135 mmol/L (ref 135–145)

## 2021-11-17 LAB — AMMONIA: Ammonia: 11 umol/L (ref 9–35)

## 2021-11-17 MED ORDER — DARBEPOETIN ALFA 60 MCG/0.3ML IJ SOSY: 60.0000 ug | PREFILLED_SYRINGE | INTRAMUSCULAR | Status: DC

## 2021-11-17 MED ORDER — DARBEPOETIN ALFA 60 MCG/0.3ML IJ SOSY: 60.0000 ug | PREFILLED_SYRINGE | INTRAMUSCULAR | Status: AC

## 2021-11-17 MED ORDER — LIDOCAINE VISCOUS HCL 2 % MT SOLN
15.0000 mL | Freq: Four times a day (QID) | OROMUCOSAL | Status: DC | PRN
  Filled 2021-11-17: qty 15

## 2021-11-17 MED ORDER — CYCLOSPORINE MODIFIED (NEORAL) 100 MG PO CAPS: 100.0000 mg | ORAL_CAPSULE | Freq: Every day | ORAL | 1 refills | Status: DC

## 2021-11-17 MED ORDER — CYCLOSPORINE MODIFIED (NEORAL) 100 MG PO CAPS: 200.0000 mg | ORAL_CAPSULE | Freq: Every day | ORAL | 1 refills | Status: DC

## 2021-11-17 MED ORDER — LACTULOSE 10 GM/15ML PO SOLN: 30.0000 g | Freq: Two times a day (BID) | ORAL | 1 refills | Status: DC

## 2021-11-17 MED ORDER — FAMOTIDINE 20 MG PO TABS: 20.0000 mg | ORAL_TABLET | Freq: Every day | ORAL | 1 refills | Status: DC

## 2021-11-17 MED ORDER — FLUCONAZOLE 100 MG PO TABS: 100.0000 mg | ORAL_TABLET | Freq: Every day | ORAL | 0 refills | Status: DC

## 2021-11-17 MED ORDER — FLUCONAZOLE 150 MG PO TABS
150.0000 mg | ORAL_TABLET | Freq: Every day | ORAL | Status: DC
  Filled 2021-11-17: qty 1

## 2021-11-17 MED ORDER — DOXERCALCIFEROL 4 MCG/2ML IV SOLN: 6.0000 ug | INTRAVENOUS | Status: AC

## 2021-11-17 MED ORDER — ALUM & MAG HYDROXIDE-SIMETH 200-200-20 MG/5ML PO SUSP: 30.0000 mL | Freq: Four times a day (QID) | ORAL | Status: DC | PRN

## 2021-11-17 MED ORDER — FLUCONAZOLE 100 MG PO TABS
100.0000 mg | ORAL_TABLET | Freq: Every day | ORAL | Status: DC
  Filled 2021-11-17: qty 1

## 2021-11-17 NOTE — Discharge Summary (Signed)
?DISCHARGE SUMMARY ? ?[name] [name] ? ?MR#: [id number] ? ?DOB:1980/11/21  ?Date of Admission: 11/05/2021 ?Date of Discharge: 11/17/2021 ? ?Attending Physician:Joniyah Mallinger Hennie Duos, MD ? ?Patient's HKV:[id number], No Pcp Per (Inactive) ? ?Consults: ?Nephrology ?Neurology ?Neurosurgery ?Hematology ?PCCM ? ?Disposition: D/c to SNF was recommended, but patient refused, therefore she was D/C home at her preference  ? ?Follow-up Appts: ? Follow-up Information   ? ? [facility].   ?Specialty: Home Health Services ?Why: Someone willcall to schedule first home visit. ?Contact information: ?[address] ?[place] [address] ?[phone] ? ? ?  ?  ? ? Rexene Agent, MD Follow up.   ?Specialty: Nephrology ?Why: See your Nephrologist (kidney doctor) while at the dialysis center for ongoing care. ?Contact information: ?[address] ?[place] [address] ?[phone] ? ? ?  ?  ? ?  ?  ? ?  ? ? ?Tests Needing Follow-up: ?-assess BP control  ?-assess possible ability to stop lactulose  ?-assure patient referred back to her transplant team for assessment of her functional pancreas transplant  ?-avoid increasing dose or frequency of narcotics - attempt to wean off narcotic  ?-assure pt has followed up w/ Neurosurgery for repeat imaging of head in 2 weeks  ? ?Discharge Diagnoses: ?Acute toxic metabolic encephalopathy ?Acute ischemic stroke of unclear etiology  ?Small left frontal subdural hematoma ?ESRD on HD ?Pulmonary edema ?Hypertensive urgency ?DM 1 with peripheral neuropathy ?Gout ?History of failed renal transplant ?COVID pneumonia ?Hyperkalemia ?Chronic thrombocytopenia ?Anemia of chronic kidney disease ? ?Initial presentation: ?41 year old with ESRD on HD, renal and pancreatic transplant w/ subsequent renal tx failure due to medication nonadherence, HTN, gastroparesis, anxiety, chronic anemia, and narcotic abuse who presented to the ED with the acute onset of confusion.  A friend  summoned EMS who found the patient conscious but very disoriented.  In the ED she was found to have pinpoint pupils and she significantly improved with a dose of IV Narcan.  She was also found to be COVID-positive.  She was admitted to the ICU with acute encephalopathy and progressively worsening respiratory status. ? ?Hospital Course: ? ?Acute toxic metabolic encephalopathy ?Possibly related to COVID as well as uremia and CVA/SDH with the most significant contributor being polypharmacy/opiate overdose and elevated ammonia - LP without evidence of meningitis -no seizure on EEG -continue lactulose - narcotic dose decreased during this admit and patient cautioned to not take extra doses, with no new Rx provided at time of d/c- avoid escalating narcotic dose as outpt  ?  ?Acute ischemic stroke ?MRI brain 3/8 noted small acute infarcts posterior paramidline cerebellum bilaterally - stroke work-up per neurology with no clear cause of infarctions -suspect concomitant small vessel disease versus hypercoagulability from COVID - no evidence of large vessel disease -not diabetic -LDL 60  ?  ?Small left frontal subdural hematoma ?Anterior left frontal subdural hemorrhage noted initially on MRI 3/8 - unclear etiology -possibly related to coagulopathy -evaluated by neurology and neurosurgery -to follow-up with [facility] in outpatient setting with plan for repeat CT head in 2 weeks (from 11/11/21) ?  ?ESRD on HD ?Nephrology attended to HD during admission - her outpt unit was altered to her pending d/c and her need for HD 11/18/21 ?  ?Pulmonary edema ?Due to inconsistent hemodialysis -volume management per dialysis during this admission - clinically improved/stable at present ?  ?Hypertensive urgency ?Blood pressure better controlled at time of d/c - f/u with ongoing HD  ?  ?DM  1 with peripheral neuropathy ?CBG presently well controlled - appears pancreatic transplant still working as pt not requiring medical tx for her DM despite use of  steroid  ?  ?Gout ?No clinical evidence of acute flare during this admission  ?  ?History of failed renal transplant ?Due to noncompliance with antirejection medication -pancreas deemed still viable therefore immunosuppression medications continued - will need outpatient follow-up of pancreas transplant care - to be established with PCP  ?  ?COVID pneumonia ?Bilateral basilar infiltrates noted on initial x-rays - completed a course of molnupiravir -has completed 10 full days of isolation and therefore isolation has been discontinued - asymptomatic at time of d/c  ?  ?Hyperkalemia ?Resolved with dialysis ?  ?Chronic thrombocytopenia ?Has been evaluated by Hematology with no further w/u felt to be indicated at present  ?  ?Anemia of chronic kidney disease ? ?Odynophagia - chest pain w/ eating  ?Suspect candida esophagitis in setting of ongoing prednisone use - utilize 7 day empiric tx w/ diflucan  ?  ? ? ?Allergies as of 11/17/2021   ? ?   Reactions  ? Iodinated Contrast Media   ? Other reaction(s): Unknown  ? ?  ? ?  ?Medication List  ?  ? ?STOP taking these medications   ? ?furosemide 20 MG tablet ?Commonly known as: LASIX ?  ?gabapentin 100 MG capsule ?Commonly known as: NEURONTIN ?  ?HYDROcodone-acetaminophen 5-325 MG tablet ?Commonly known as: Norco ?  ?Lokelma 10 g Pack packet ?Generic drug: sodium zirconium cyclosilicate ?  ?losartan 188 MG tablet ?Commonly known as: COZAAR ?  ? ?  ? ?TAKE these medications   ? ?amLODipine 10 MG tablet ?Commonly known as: NORVASC ?Take 10 mg by mouth at bedtime. ?  ?aspirin EC 81 MG tablet ?Take 81 mg by mouth daily. ?  ?Catapres-TTS-1 0.1 mg/24hr patch ?Generic drug: cloNIDine ?Place 0.1 mg onto the skin once a week. ?  ?colchicine 0.6 MG tablet ?Take 0.6 mg by mouth 3 (three) times daily as needed (gout flares). ?  ?cycloSPORINE modified 100 MG capsule ?Commonly known as: NEORAL ?Take 2 capsules (200 mg total) by mouth daily. ?What changed: when to take this ?  ?cycloSPORINE  modified 100 MG capsule ?Commonly known as: NEORAL ?Take 1 capsule (100 mg total) by mouth at bedtime. ?What changed:  ?medication strength ?how much to take ?when to take this ?  ?Darbepoetin Alfa 60 MCG/0.3ML Sosy injection ?Commonly known as: ARANESP ?Inject 0.3 mLs (60 mcg total) into the vein every Friday with hemodialysis. ?Start taking on: November 18, 2021 ?  ?diphenhydrAMINE 25 MG tablet ?Commonly known as: BENADRYL ?Take 25 mg by mouth every 6 (six) hours as needed for itching. ?  ?doxercalciferol 4 MCG/2ML injection ?Commonly known as: HECTOROL ?Inject 3 mLs (6 mcg total) into the vein every Monday, Wednesday, and Friday with hemodialysis. ?  ?famotidine 20 MG tablet ?Commonly known as: PEPCID ?Take 1 tablet (20 mg total) by mouth daily. ?Start taking on: November 18, 2021 ?  ?ferric citrate 1 GM 210 MG(Fe) tablet ?Commonly known as: AURYXIA ?Take 630 mg by mouth 3 (three) times daily with meals. ?  ?fluconazole 100 MG tablet ?Commonly known as: DIFLUCAN ?Take 1 tablet (100 mg total) by mouth daily. ?Start taking on: November 18, 2021 ?  ?heparin 1000 unit/mL Soln injection ?Heparin Sodium (Porcine) 1,000 Units/mL Catheter Lock Arterial ?  ?labetalol 200 MG tablet ?Commonly known as: NORMODYNE ?Take 600 mg by mouth 2 (two) times daily. ?  ?lactulose  10 GM/15ML solution ?Commonly known as: [place] ?Take 45 mLs (30 g total) by mouth 2 (two) times daily. ?  ?lidocaine-prilocaine cream ?Commonly known as: EMLA ?Apply 1 application topically daily as needed (port access). ?  ?metoCLOPramide 5 MG tablet ?Commonly known as: REGLAN ?Take 5 mg by mouth every 6 (six) hours as needed for nausea or vomiting. ?  ?MIRCERA IJ ?Mircera ?  ?mycophenolate 180 MG EC tablet ?Commonly known as: MYFORTIC ?Take 540 mg by mouth 2 (two) times daily. ?  ?Narcan 4 MG/0.1ML Liqd nasal spray kit ?Generic drug: naloxone ?Place 1 spray into the nose as needed (opioid overdose). ?  ?ondansetron 4 MG tablet ?Commonly known as: ZOFRAN ?Take 4 mg  by mouth 2 (two) times daily as needed for nausea or vomiting. ?  ?oxyCODONE-acetaminophen 5-325 MG tablet ?Commonly known as: PERCOCET/ROXICET ?Take 1-2 tablets by mouth every 6 (six) hours as neede

## 2021-11-17 NOTE — Progress Notes (Signed)
Physical Therapy Treatment ?Patient Details ?Name: [name] [name] ?MRN: [id number] ?DOB: [birth year] ?Today's Date: 11/17/2021 ? ? ?History of Present Illness 41 y.o. female presented via EMS 11/05/20 with AMS. Pt  COVID positive, possible narcotics overdose, given Narcan, found to be hyperammonemia EEG showing diffuse severe encephalopathy. 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 ?  ?PT Comments  ? ? Patient continues to be limited by dizziness and unable to tolerate standing >1-2 minutes. Patient with 18 point drop in systolic BP when transitioning from sitting to standing. Unable to tolerate standing for 3 minutes to obtain subsequent BP due to increasing dizziness. Patient is at a high fall risk due to dizziness and drop in BP with mobility. Patient lives alone and is not safe to discharge home alone without 24/7 assistance. Notified RN and MD of vitals with mobility. Continue to recommend SNF at discharge.  ? ?Orthostatic BPs ? ?Sitting 116/66  ?Standing 98/56 (unable to tolerate standing x 3 minutes due to dizziness)  ?Sitting 112/66  ?  ?  ?Recommendations for follow up therapy are one component of a multi-disciplinary discharge planning process, led by the attending physician.  Recommendations may be updated based on patient status, additional functional criteria and insurance authorization. ? ?Follow Up Recommendations ? Skilled nursing-short term rehab (<3 hours/day) ?  ?  ?Assistance Recommended at Discharge Frequent or constant Supervision/Assistance  ?Patient can return home with the following A lot of help with walking  and/or transfers;Help with stairs or ramp for entrance;Assist for transportation ?  ?Equipment Recommendations ? Rolling Hamza Empson (2 wheels)  ?  ?Recommendations for Other Services   ? ? ?  ?Precautions / Restrictions Precautions ?Precautions: Fall;Other (comment) ?Precaution Comments: orthostatic ?Restrictions ?Weight Bearing Restrictions: No  ?  ? ?Mobility ? Bed Mobility ?Overal bed mobility: Modified Independent ?  ?  ?  ?  ?  ?  ?  ?  ? ?Transfers ?Overall transfer level: Needs assistance ?Equipment used: Rolling Konrad Hoak (2 wheels) ?Transfers: Sit to/from Stand ?Sit to Stand: Min guard ?  ?  ?  ?  ?  ?General transfer comment: min guard for safety due to reporting dizziness upon standing ?  ? ?Ambulation/Gait ?Ambulation/Gait assistance: Min assist ?Gait Distance (Feet): 6 Feet ?Assistive device: Rolling Brinklee Cisse (2 wheels) ?Gait Pattern/deviations: Step-to pattern, Decreased stride length, Trunk flexed ?Gait velocity: decreased ?  ?  ?General Gait Details: ambulated 3' then reported dizziness and returned to bed. Cues for maintaining RW close to self as patient leaving it behind to turn around. MinA for balance. Patient unable to tolerate x 1-2 minutes of activity before dizziness becomes unbearable ? ? ?Stairs ?  ?  ?  ?  ?  ? ? ?Wheelchair Mobility ?  ? ?Modified Rankin (Stroke Patients Only) ?  ? ? ?  ?Balance Overall balance assessment: Needs assistance ?Sitting-balance support: No upper extremity supported, Feet supported ?Sitting balance-Leahy Scale: Fair ?  ?  ?Standing balance support: Bilateral upper extremity supported, Reliant on assistive device for balance ?Standing balance-Leahy Scale: Poor ?  ?  ?  ?  ?  ?  ?  ?  ?  ?  ?  ?  ?  ? ?  ?  Cognition Arousal/Alertness: Awake/alert ?Behavior During Therapy: Flat affect ?Overall Cognitive Status: Impaired/Different from baseline ?Area of Impairment: Safety/judgement, Awareness, Problem solving ?  ?  ?  ?  ?  ?  ?  ?  ?  ?  ?  ?  ?Safety/Judgement: Decreased  awareness of safety, Decreased awareness of deficits ?Awareness: Intellectual ?Problem Solving: Slow processing, Requires verbal cues ?General Comments: poor safety awareness as patient unable to sustain standing >1-2 minutes of standing and is discharging home this date. Educated patient on BP dropping with transitional movement and unsafe d/c plan to return home alone. ?  ?  ? ?  ?Exercises   ? ?  ?General Comments General comments (skin integrity, edema, etc.): BP sitting 116/66, standing 98/56, unable to tolerate standing >1-2 minutes to obtain 3 minute standing BP due to dizziness. ?  ?  ? ?Pertinent Vitals/Pain Pain Assessment ?Pain Assessment: No/denies pain  ? ? ?Home Living   ?  ?  ?  ?  ?  ?  ?  ?  ?  ?   ?  ?Prior Function    ?  ?  ?   ? ?PT Goals (current goals can now be found in the care plan section) Acute Rehab PT Goals ?PT Goal Formulation: Patient unable to participate in goal setting ?Time For Goal Achievement: 11/24/21 ?Potential to Achieve Goals: Fair ?Progress towards PT goals: Not progressing toward goals - comment ? ?  ?Frequency ? ? ? Min 3X/week ? ? ? ?  ?PT Plan Current plan remains appropriate  ? ? ?Co-evaluation   ?  ?  ?  ?  ? ?  ?AM-PAC PT "6 Clicks" Mobility   ?Outcome Measure ? Help needed turning from your back to your side while in a flat bed without using bedrails?: None ?Help needed moving from lying on your back to sitting on the side of a flat bed without using bedrails?: A Little ?Help needed moving to and from a bed to a chair (including a wheelchair)?: A Little ?Help needed standing up from a chair using your arms (e.g., wheelchair or bedside chair)?: A Little ?Help needed to walk in hospital room?: Total ?Help needed climbing 3-5 steps with a railing? : Total ?6 Click Score: 15 ? ?  ?End of Session Equipment Utilized During Treatment: Gait belt ?Activity Tolerance: Other (comment) (limited by orthostasis) ?Patient left: in bed;with call bell/phone within reach ?Nurse  Communication: Mobility status;Other (comment) (orthostatics) ?PT Visit Diagnosis: Other abnormalities of gait and mobility (R26.89);Dizziness and giddiness (R42);Muscle weakness (generalized) (M62.81);Unsteadiness on feet (R26.81) ?  ? ? ?Time: 0938-1829 ?PT Time Calculation (min) (ACUTE ONLY): 23 min ? ?Charges:  $Therapeutic Activity: 23-37 mins          ?          ? ?Elige Shouse A. Gilford Rile, PT, DPT ?Acute Rehabilitation Services ?Pager [phone] ?Office [phone] ? ? ? ?Linas Stepter A Jacobi Ryant ?11/17/2021, 2:09 PM ? ?

## 2021-11-17 NOTE — Progress Notes (Signed)
Subjective: Seen in room, said tolerated dialysis yesterday no UF, now off COVID precautions, reports some chest pain says it been present for several weeks admit team work-up in process /denies shortness of breath ? ?Objective ?Vital signs in last 24 hours: ?Vitals:  ? 11/16/21 2030 11/16/21 2044 11/16/21 2240 11/17/21 0904  ?BP: 117/63 (!) 141/67 (!) 145/69 (!) 158/66  ?Pulse: 81 78 75 72  ?Resp:  '18 17 17  '$ ?Temp:  97.7 ?F (36.5 ?C) 98.4 ?F (36.9 ?C) 98.3 ?F (36.8 ?C)  ?TempSrc:  Oral Oral Oral  ?SpO2:  100% 100% 94%  ?Weight:  53.2 kg    ?Height:      ? ?Weight change:  ? ?Physical Exam: ?General: Alert pleasant NAD ?Heart: RRR no MRG ?Lungs: CTA no rales wheezing or rhonchi nonlabored breathing ?Abdomen: NABS, soft NTND ?Extremities: No pedal edema ?Dialysis Access: RUA AVF + bruit ? ?OP HD: AF MWF ? 3h 33mn  400/500  59kg (needs new lower EDW)  2/2 bath  RUE AVF  15g  Hep 2000 ? - hectorol 6 ug tiw IV ? - no esa ? ?Problem/Plan: ? AMS -now resolved, no seizures by EEG.  Thought to be altered factorial= metabolic due to ^[facility] uremia/ esrd, possible acute SDH.  Also COVID-positive on admission, NSurg evaluating for SDH (no surgery). MS back to baseline now w/ lactulose and HD. Asterixis resolved.  ?COVID pna - basilar infiltrates on xrays. Viral and/ or bacterial infeciton.  On IV Rocephin. SP molnupiravir. Pt not volume overloaded. COVID precautions lifted on 11/15/21; relatively asymp at this point other than fatigue.  ?ESRD - usual HD MWF. Last HD on 3/15 Next HD 3/17 on schedule, no heparin HD.  Needs new lower EDW upon d/c. ?HTN - BP's were low but BP meds resumed. 6kg under dry wt. looking dry, , no UF yesterday with dialysis ?H/o failed pancreas/ renal transplant - pancreas still viable taking CyA, pred and myfortic. CyA and myfortic held initially. CyA was resumed at slightly lower dose of '200mg'$  am and '100mg'$  pm (high trough off 309 here). Myfortic was restarted after IV abx were completed on 3/11 at the  usual dose . Getting prednisone at 15 qd, this can probably be tapered back down to usual dose 5 mg/ d. Pt not seeing anybody for her pancreas transplant. We will f/u on CyA levels post dc at the OP HD unit.  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 Ca in range,  Cont IV vdra follow-up calcium phosphorus trend ?Anemia ckd this a.m. Hgb 9.2 was 11.3 follow-up trend will start  esa  ?Thrombocytopenia= platelets slight improvement 129 this a.m. heme being consulted -> slowly incr trend thought likely to be from the infx ? ?DErnest Haber PA-C ?[facility] ?Beeper [phone]?11/17/2021,10:57 AM ? LOS: 11 days  ? ?Labs: ?Basic Metabolic Panel: ?Recent Labs  ?Lab 11/15/21 ?0409 11/16/21 ?0359 11/17/21 ?0606  ?NA 137 136 135  ?K 3.1* 3.3* 3.3*  ?CL 96* 95* 96*  ?CO2 '26 24 27  '$ ?GLUCOSE 74 83 100*  ?BUN 29* 41* 22*  ?CREATININE 5.38* 7.81* 5.58*  ?CALCIUM 8.5* 8.6* 8.5*  ?PHOS 5.5* 6.8* 5.1*  ? ?Liver Function Tests: ?Recent Labs  ?Lab 11/15/21 ?0409 11/16/21 ?0359 11/17/21 ?0606  ?ALBUMIN 2.8* 2.9* 2.7*  ? ?No results for input(s): LIPASE, AMYLASE in the last 168 hours. ?Recent Labs  ?Lab 11/17/21 ?0606  ?AMMONIA 11  ? ?  CBC: ?Recent Labs  ?Lab 11/11/21 ?0225 11/12/21 ?8416 11/14/21 ?0423 11/16/21 ?1712 11/17/21 ?0606  ?WBC 3.5* 3.4* 3.8* 3.8* 3.7*  ?NEUTROABS 2.5 2.4 2.8  --   --   ?HGB 12.8 11.3* 9.8* 9.6* 9.2*  ?HCT 37.6 33.3* 29.3* 28.2* 27.0*  ?MCV 89.3 87.6 89.6 87.6 87.9  ?PLT 61* 67* 91* 134* 129*  ? ?Cardiac Enzymes: ?No results for input(s): CKTOTAL, CKMB, CKMBINDEX, TROPONINI in the last 168 hours. ?CBG: ?Recent Labs  ?Lab 11/16/21 ?0636 11/16/21 ?6063 11/16/21 ?1150 11/16/21 ?1653 11/16/21 ?2242  ?GLUCAP 80 97 112* 127* 89  ? ? ?Studies/Results: ?No results found. ?Medications: ? ? amLODipine  10 mg Oral Daily  ? vitamin C  500 mg Oral Daily  ? aspirin EC  81 mg Oral Daily  ?  Chlorhexidine Gluconate Cloth  6 each Topical Daily  ? cholecalciferol  1,000 Units Oral Daily  ? cloNIDine  0.1 mg Transdermal Weekly  ? cycloSPORINE modified  100 mg Oral Q1400  ? cycloSPORINE modified  200 mg Oral Q breakfast  ? doxercalciferol  6 mcg Intravenous Q M,W,F-HD  ? famotidine  20 mg Oral Daily  ? ferric citrate  630 mg Oral TID WC  ? fluconazole  100 mg Oral Daily  ? insulin aspart  0-6 Units Subcutaneous TID WC  ? labetalol  300 mg Oral TID  ? lactulose  30 g Oral BID  ? mycophenolate  540 mg Oral BID  ? predniSONE  15 mg Oral Q breakfast  ? zinc sulfate  220 mg Oral Daily  ? ? ? ? ?

## 2021-11-17 NOTE — Progress Notes (Signed)
Pt refused blood sugar this am. Educated pt on why blood sugars are important. She stated she isnt diabetic, told her when the doctor comes around she can discuss that further with him. She also refused EKG. I educated her on why Dr ordered it. Will try again later to get EKG. ?

## 2021-11-17 NOTE — TOC Progression Note (Signed)
Transition of Care (TOC) - Initial/Assessment Note  ? ? ?Patient Details  ?Name: [name] [name] ?MRN: [id number] ?Date of Birth: 26-Jun-1981 ? ?Transition of Care (TOC) CM/SW Contact:    ?Paulene Floor Jaeleen Inzunza, LCSWA ?Phone Number: ?11/17/2021, 11:26 AM ? ?Clinical Narrative:                 ?CSW met with patient at bedside to present bed offers.  The patient now reports that she wants to go home and is not willing to go to a SNF.  CSW inquired about assistance at the home and the patient reports that she can "take care of herself".  The patient is willing to receive home health.   ? ?Floor RN, MD, and RNCM notified. ? ?Expected Discharge Plan: [place] ?Barriers to Discharge: SNF Pending bed offer, Continued Medical Work up, Ship broker ? ? ?Patient Goals and CMS Choice ?Patient states their goals for this hospitalization and ongoing recovery are:: To return home ?CMS Medicare.gov Compare Post Acute Care list provided to:: Patient ?  ? ?Expected Discharge Plan and Services ?Expected Discharge Plan: [place] ?  ?Discharge Planning Services: CM Consult ?Post Acute Care Choice: Home Health ?Living arrangements for the past 2 months: [place] ?                ?DME Arranged: Walker rolling ?DME Agency: [facility] ?Date DME Agency Contacted: 11/11/21 ?Time DME Agency Contacted: [phone] ?Representative spoke with at DME Agency: Freda Munro ?HH Arranged: PT, OT, RN ?[place] Agency: [facility] ?Date HH Agency Contacted: 11/11/21 ?Time [place]: 6283 ?Representative spoke with at [place]: Delsa Sale ? ?Prior Living Arrangements/Services ?Living arrangements for the past 2 months: [place] ?Lives with:: Self ?Patient language and need for interpreter reviewed:: Yes ?       ?Need for Family Participation in Patient Care: Yes (Comment) ?Care giver support system in place?: Yes (comment) ?  ?Criminal Activity/Legal Involvement Pertinent to Current  Situation/Hospitalization: No - Comment as needed ? ?Activities of Daily Living ?  ?  ? ?Permission Sought/Granted ?Permission sought to share information with : Case Manager, Customer service manager, Family Supports ?Permission granted to share information with : Yes, Verbal Permission Granted ?   ?   ?   ?   ? ?Emotional Assessment ?Appearance:: Appears stated age ?Attitude/Demeanor/Rapport: Other (comment) ?Affect (typically observed): Other (comment) ?Orientation: : Oriented to Self, Oriented to Place ?Alcohol / Substance Use: Other (comment) (Narcotic abuse) ?Psych Involvement: No (comment) ? ?Admission diagnosis:  Uremia [N19] ?Acute encephalopathy [G93.40] ?Patient Active Problem List  ? Diagnosis Date Noted  ? Fever 11/07/2021  ? Acute encephalopathy 11/06/2021  ? Pulmonary edema 11/06/2021  ? Hypertensive urgency 11/06/2021  ? Type 2 diabetes mellitus with peripheral neuropathy ([place]) 11/06/2021  ? History of renal transplant 11/06/2021  ? COVID-19 virus infection 11/06/2021  ? HTN (hypertension), malignant 08/02/2021  ? ESRD (end stage renal disease) ([place]) 02/25/2021  ? Coagulation defect, unspecified ([place]) 12/22/2020  ? Acute cough 10/04/2020  ? Allergy, unspecified, initial encounter 05/21/2020  ? Anaphylactic shock, unspecified, initial encounter 05/21/2020  ? Symptomatic anemia 02/27/2020  ? Shortness of breath 04/23/2019  ? Hypocalcemia 12/30/2018  ? Aortic atherosclerosis ([place]) 12/27/2018  ? Acute respiratory failure with hypoxia ([place]) 12/22/2018  ? Hyperkalemia   ? Bilateral pneumonia 12/21/2018  ? Pancreas transplant status ([place]) 12/21/2018  ? Immunosuppression ([place]) 12/21/2018  ? Chest pain 12/21/2018  ? CAP (community acquired pneumonia) 12/20/2018  ? Other  staphylococcus as the cause of diseases classified elsewhere 08/16/2018  ? Mild protein-calorie malnutrition ([place]) 04/18/2018  ? Encounter for screening for respiratory tuberculosis 04/06/2018  ? Gout 04/06/2018  ? Iron deficiency  anemia, unspecified 04/06/2018  ? Leiomyoma of uterus, unspecified 04/06/2018  ? Secondary hyperparathyroidism of renal origin ([place]) 04/06/2018  ? Acute rejection of kidney transplant 07/11/2017  ? Elevated serum creatinine 06/27/2017  ? E. coli UTI 12/18/2014  ? Transplant rejection 09/03/2014  ? Cyst of ovary, left 09/01/2014  ? Immunosuppressive management encounter following kidney transplant 08/26/2014  ? Acne 04/28/2014  ? Itch of skin 04/28/2014  ? Diabetic gastroparesis associated with type 1 diabetes mellitus ([place]) 11/07/2013  ? Nausea & vomiting 11/07/2013  ? Lesion of spleen 10/07/2013  ? Metabolic acidosis 23/79/9094  ? Patient's noncompliance with other medical treatment and regimen 09/12/2013  ? Narcotic dependence ([place]) 08/31/2013  ? Generalized pain 08/30/2013  ? Hypomagnesemia 08/30/2013  ? Renal hematoma of right transplanted kidney 08/21/2013  ? Hypophosphatemia 07/28/2013  ? End stage renal disease ([place]) 02/06/2012  ? Pleural effusion, bilateral 11/16/2011  ? Anemia in chronic kidney disease (CKD) 11/16/2011  ? Pericardial effusion 11/15/2011  ? Fluid overload 11/15/2011  ? Abdominal pain, epigastric 11/15/2011  ? CKD (chronic kidney disease) stage 5, GFR less than 15 ml/min (HCC) 11/03/2011  ? Hypertension 10/27/2011  ? Renal failure, chronic 10/27/2011  ? MEDIAL EPICONDYLITIS, LEFT 12/17/2007  ? ABSCESS, AXILLA, LEFT 12/03/2007  ? Type 2 diabetes mellitus with other diabetic kidney complication ([place]) 00/01/566  ? ?PCP:  Patient, No Pcp Per (Inactive) ?Pharmacy:   ?[facility] - [place] - [address] ?[address] ?[address], [address] ?[place] [address] ?Phone: [phone] Fax: [phone] ? ?[id number] [place]?Phone: [phone]: [phone]? ? ? ? ?Social Determinants of Health (SDOH) Interventions ?  ? ?Readmission Risk Interventions ?No flowsheet data  found. ? ? ?

## 2021-11-17 NOTE — Progress Notes (Signed)
DISCHARGE NOTE HOME ?[name] [name] to be discharged Home per MD order. Discussed prescriptions and follow up appointments with the patient. Prescriptions given to patient; medication list explained in detail. Patient verbalized understanding. ? ?Skin clean, dry and intact without evidence of skin break down, no evidence of skin tears noted. IV catheter discontinued intact. Site without signs and symptoms of complications. Dressing and pressure applied. Pt denies pain at the site currently. No complaints noted. ? ?Patient free of lines, drains, and wounds.  ? ?An After Visit Summary (AVS) was printed and given to the patient. ?Patient escorted via wheelchair, and discharged home via private auto. ? ?Shawndell Varas S Maureena Dabbs, RN   ?

## 2021-11-17 NOTE — TOC Transition Note (Signed)
Transition of Care (TOC) - CM/SW Discharge Note ? ? ?Patient Details  ?Name: Rudene Naseer Adderley ?MRN: [id number] ?Date of Birth: 01/16/1981 ? ?Transition of Care (TOC) CM/SW Contact:  ?Tom-Johnson, Renea Ee, RN ?Phone Number: ?11/17/2021, 3:01 PM ? ? ?Clinical Narrative:    ? ?Patient is scheduled for discharge today. Home health information on AVS. Hospital f/u information on AVS as well. RW delivered at bedside by [employer]. Jeanelle Malling to transport at discharge. No further TOC needs noted. ? ?Final next level of care: [place] ?Barriers to Discharge: Barriers Resolved ? ? ?Patient Goals and CMS Choice ?Patient states their goals for this hospitalization and ongoing recovery are:: To return home ?CMS Medicare.gov Compare Post Acute Care list provided to:: Patient ?Choice offered to / list presented to : Patient ? ?Discharge Placement ?  ?           ?  ?Patient to be transferred to facility by: Jeanelle Malling ?  ?  ? ?Discharge Plan and Services ?  ?Discharge Planning Services: CM Consult ?Post Acute Care Choice: Home Health          ?DME Arranged: Walker rolling ?DME Agency: [facility] ?Date DME Agency Contacted: 11/11/21 ?Time DME Agency Contacted: [phone] ?Representative spoke with at DME Agency: Freda Munro ?HH Arranged: PT, OT, RN ?[place] Agency: [facility] ?Date HH Agency Contacted: 11/11/21 ?Time [place]: 7035 ?Representative spoke with at [place]: Delsa Sale ? ?Social Determinants of Health (SDOH) Interventions ?  ? ? ?Readmission Risk Interventions ?Readmission Risk Prevention Plan 11/17/2021  ?Transportation Screening Complete  ?Medication Review Press photographer) Complete  ?PCP or Specialist appointment within 3-5 days of discharge Complete  ?[place] or Home Care Consult Complete  ?SW Recovery Care/Counseling Consult Complete  ?Palliative Care Screening Not Applicable  ?[place] Patient Refused  ?Some recent data might be hidden  ? ? ? ? ? ?

## 2021-11-17 NOTE — Progress Notes (Signed)
Advised by [facility] staff that pt is for d/c today. Contacted [place] [facility] and spoke to [place]. Clinic aware pt will resume care tomorrow.  ? ?Melven Sartorius ?Renal Navigator ?[phone] ?

## 2021-11-19 NOTE — TOC Transition Note (Signed)
Transition of care contact from inpatient facility ? ?Date of discharge: 11/17/21 ?Date of contact: 11/19/21 ?Method: Phone ?Spoke to: Patient ? ?Patient contacted to discuss transition of care from recent inpatient hospitalization. Patient was admitted to [facility] from 11/05/21-11/17/21 with discharge diagnosis of acute toxic metabolic encephalopathy. ? ?Medication changes were reviewed. ? ?Patient will follow up with her outpatient HD unit on: Monday 11/21/21 at [facility]. ? ?Tobie Poet, NP ?  ?

## 2021-11-21 NOTE — Progress Notes (Deleted)
? ? ?Established Dialysis Access ? ? ?History of Present Illness  ? ?[name] [name] is a 41 y.o. (Jan 23, 1981) female who presents for post operative follow up after washout of right axillary vein exposure site hematoma and primary repair of anastomosis by Dr. Donzetta Matters on 02/25/21. Earlier that day she had undergone cephalic vein turndown to her axillary vein by Dr. Carlis Abbott and she presented to ED with symptomatic hematoma with pain. She was seen in follow up on 03/08/21 by Dr. Carlis Abbott at which time she was having pain in her axilla. No steal symptoms. Otherwise her surgical site and fistula were well appearing. She returns today for staple removal. She explains that her pain is much better. She has some intermittent stabbing pains that come and go. Otherwise no complaints. She is just very anxious and worried about her staples and dialysis going forward. ? ?She is currently dialyzing via a Right IJ TDC at the [facility] location ? ?Current Outpatient Medications  ?Medication Sig Dispense Refill  ? amLODipine (NORVASC) 10 MG tablet Take 10 mg by mouth at bedtime.    ? aspirin EC 81 MG tablet Take 81 mg by mouth daily.    ? CATAPRES-TTS-1 0.1 MG/24HR patch Place 0.1 mg onto the skin once a week.    ? colchicine 0.6 MG tablet Take 0.6 mg by mouth 3 (three) times daily as needed (gout flares).    ? cycloSPORINE modified (NEORAL) 100 MG capsule Take 2 capsules (200 mg total) by mouth daily. 30 capsule 1  ? cycloSPORINE modified (NEORAL) 100 MG capsule Take 1 capsule (100 mg total) by mouth at bedtime. 30 capsule 1  ? Darbepoetin Alfa (ARANESP) 60 MCG/0.3ML SOSY injection Inject 0.3 mLs (60 mcg total) into the vein every Friday with hemodialysis. 4.2 mL   ? diphenhydrAMINE (BENADRYL) 25 MG tablet Take 25 mg by mouth every 6 (six) hours as needed for itching.    ? doxercalciferol (HECTOROL) 4 MCG/2ML injection Inject 3 mLs (6 mcg total) into the vein every Monday, Wednesday, and Friday with hemodialysis. 1 mL   ?  famotidine (PEPCID) 20 MG tablet Take 1 tablet (20 mg total) by mouth daily. 30 tablet 1  ? ferric citrate (AURYXIA) 1 GM 210 MG(Fe) tablet Take 630 mg by mouth 3 (three) times daily with meals.    ? fluconazole (DIFLUCAN) 100 MG tablet Take 1 tablet (100 mg total) by mouth daily. 6 tablet 0  ? heparin 1000 unit/mL SOLN injection Heparin Sodium (Porcine) 1,000 Units/mL Catheter Lock Arterial    ? labetalol (NORMODYNE) 200 MG tablet Take 600 mg by mouth 2 (two) times daily.    ? lactulose (CHRONULAC) 10 GM/15ML solution Take 45 mLs (30 g total) by mouth 2 (two) times daily. 236 mL 1  ? lidocaine-prilocaine (EMLA) cream Apply 1 application topically daily as needed (port access).    ? Methoxy PEG-Epoetin Beta (MIRCERA IJ) Mircera    ? metoCLOPramide (REGLAN) 5 MG tablet Take 5 mg by mouth every 6 (six) hours as needed for nausea or vomiting.    ? mycophenolate (MYFORTIC) 180 MG EC tablet Take 540 mg by mouth 2 (two) times daily.    ? naloxone (NARCAN) nasal spray 4 mg/0.1 mL Place 1 spray into the nose as needed (opioid overdose).    ? ondansetron (ZOFRAN) 4 MG tablet Take 4 mg by mouth 2 (two) times daily as needed for nausea or vomiting.    ? oxyCODONE-acetaminophen (PERCOCET/ROXICET) 5-325 MG tablet Take 1-2 tablets by  mouth every 6 (six) hours as needed for severe pain. (Patient taking differently: Take 1 tablet by mouth 3 (three) times daily as needed for moderate pain.) 15 tablet 0  ? predniSONE (DELTASONE) 5 MG tablet Take 15 mg by mouth daily.  5  ? zolpidem (AMBIEN) 10 MG tablet Take 10 mg by mouth at bedtime as needed for sleep.    ? ?No current facility-administered medications for this visit.  ? ? ?On ROS today: negative unless stated in HPI ? ? ?Physical Examination  ? ?There were no vitals filed for this visit. ? ?There is no height or weight on file to calculate BMI. ? ?General Alert, O x 3, WD, NAD  ?Pulmonary Sym exp, good B air movt, CTA B  ?Cardiac Regular rate and rhythm , no Murmurs,  ?Vascular  Vessel Right Left  ?Radial Faintly palpable Palpable  ?Brachial Palpable Palpable  ?Ulnar Not palpable Not palpable  ?  ?Musculo- ?skeletal Right upper arm fistula with good thrill. Right chest wall incision is healing well. Staples removed ? ? ?M/S 5/5 throughout  , Extremities without ischemic changes    ?Neurologic A&O; CN grossly intact  ? ? ? ?Medical Decision Making  ? ?[name] [name] is a 41 y.o. female who presents for follow up staple removal after washout of right axillary vein exposure site hematoma and primary repair of anastomosis by Dr. Donzetta Matters on 02/25/21. Earlier that day she had undergone cephalic vein turndown to her axillary vein by Dr. Carlis Abbott. Her fistula has excellent thrill. Her incisions are healing well. Staples removed today. Her fistula can be used starting next week 03/28/21 or at discretion of the patient. She will follow up with [place] as needed. Her TDC can be arranged to be removed when the dialysis center feels comfortable with the function of her AV fistula. Please contact us to have this removed when she is ready.  ? ? ?Cherre Robins, PA-C ?[facility] ?Office: [phone] ? ?Clinic MD: [place]/ [place] ?

## 2021-11-22 ENCOUNTER — Ambulatory Visit: Payer: Medicare Other | Admitting: Vascular Surgery

## 2021-11-22 ENCOUNTER — Other Ambulatory Visit (HOSPITAL_COMMUNITY): Payer: Medicare Other

## 2021-11-24 ENCOUNTER — Inpatient Hospital Stay: Payer: Medicare Other | Admitting: Nurse Practitioner

## 2021-11-29 LAB — CULTURE, FUNGUS WITHOUT SMEAR

## 2021-12-01 ENCOUNTER — Emergency Department (HOSPITAL_COMMUNITY): Payer: Medicare Other

## 2021-12-01 ENCOUNTER — Ambulatory Visit: Payer: Self-pay | Admitting: *Deleted

## 2021-12-01 ENCOUNTER — Inpatient Hospital Stay (HOSPITAL_COMMUNITY)
Admission: EM | Admit: 2021-12-01 | Discharge: 2021-12-04 | DRG: 064 | Disposition: A | Discharge status: Home / Self Care | Payer: Medicare Other | Attending: Internal Medicine | Admitting: Internal Medicine

## 2021-12-01 ENCOUNTER — Other Ambulatory Visit: Payer: Self-pay

## 2021-12-01 ENCOUNTER — Encounter (HOSPITAL_COMMUNITY): Payer: Self-pay

## 2021-12-01 DIAGNOSIS — E109 Type 1 diabetes mellitus without complications: Secondary | ICD-10-CM

## 2021-12-01 DIAGNOSIS — Z8249 Family history of ischemic heart disease and other diseases of the circulatory system: Secondary | ICD-10-CM

## 2021-12-01 DIAGNOSIS — E875 Hyperkalemia: Secondary | ICD-10-CM | POA: Diagnosis present

## 2021-12-01 DIAGNOSIS — G8929 Other chronic pain: Secondary | ICD-10-CM

## 2021-12-01 DIAGNOSIS — Z7901 Long term (current) use of anticoagulants: Secondary | ICD-10-CM

## 2021-12-01 DIAGNOSIS — Z940 Kidney transplant status: Secondary | ICD-10-CM

## 2021-12-01 DIAGNOSIS — R9389 Abnormal findings on diagnostic imaging of other specified body structures: Secondary | ICD-10-CM

## 2021-12-01 DIAGNOSIS — D62 Acute posthemorrhagic anemia: Secondary | ICD-10-CM | POA: Diagnosis present

## 2021-12-01 DIAGNOSIS — R079 Chest pain, unspecified: Secondary | ICD-10-CM | POA: Diagnosis present

## 2021-12-01 DIAGNOSIS — N2581 Secondary hyperparathyroidism of renal origin: Secondary | ICD-10-CM | POA: Diagnosis present

## 2021-12-01 DIAGNOSIS — Z7952 Long term (current) use of systemic steroids: Secondary | ICD-10-CM

## 2021-12-01 DIAGNOSIS — Z91041 Radiographic dye allergy status: Secondary | ICD-10-CM

## 2021-12-01 DIAGNOSIS — R935 Abnormal findings on diagnostic imaging of other abdominal regions, including retroperitoneum: Secondary | ICD-10-CM

## 2021-12-01 DIAGNOSIS — I160 Hypertensive urgency: Secondary | ICD-10-CM | POA: Diagnosis present

## 2021-12-01 DIAGNOSIS — R109 Unspecified abdominal pain: Secondary | ICD-10-CM

## 2021-12-01 DIAGNOSIS — Z8673 Personal history of transient ischemic attack (TIA), and cerebral infarction without residual deficits: Secondary | ICD-10-CM

## 2021-12-01 DIAGNOSIS — T39395A Adverse effect of other nonsteroidal anti-inflammatory drugs [NSAID], initial encounter: Secondary | ICD-10-CM | POA: Diagnosis present

## 2021-12-01 DIAGNOSIS — I120 Hypertensive chronic kidney disease with stage 5 chronic kidney disease or end stage renal disease: Secondary | ICD-10-CM | POA: Diagnosis present

## 2021-12-01 DIAGNOSIS — D696 Thrombocytopenia, unspecified: Secondary | ICD-10-CM | POA: Diagnosis present

## 2021-12-01 DIAGNOSIS — R1084 Generalized abdominal pain: Principal | ICD-10-CM

## 2021-12-01 DIAGNOSIS — R519 Headache, unspecified: Secondary | ICD-10-CM | POA: Diagnosis not present

## 2021-12-01 DIAGNOSIS — I6203 Nontraumatic chronic subdural hemorrhage: Principal | ICD-10-CM | POA: Diagnosis present

## 2021-12-01 DIAGNOSIS — Z87891 Personal history of nicotine dependence: Secondary | ICD-10-CM

## 2021-12-01 DIAGNOSIS — T8612 Kidney transplant failure: Secondary | ICD-10-CM | POA: Diagnosis present

## 2021-12-01 DIAGNOSIS — Z7982 Long term (current) use of aspirin: Secondary | ICD-10-CM

## 2021-12-01 DIAGNOSIS — Z8616 Personal history of COVID-19: Secondary | ICD-10-CM

## 2021-12-01 DIAGNOSIS — N186 End stage renal disease: Secondary | ICD-10-CM | POA: Diagnosis present

## 2021-12-01 DIAGNOSIS — D72819 Decreased white blood cell count, unspecified: Secondary | ICD-10-CM | POA: Diagnosis present

## 2021-12-01 DIAGNOSIS — Z79899 Other long term (current) drug therapy: Secondary | ICD-10-CM

## 2021-12-01 DIAGNOSIS — E8770 Fluid overload, unspecified: Secondary | ICD-10-CM | POA: Diagnosis present

## 2021-12-01 DIAGNOSIS — K2971 Gastritis, unspecified, with bleeding: Secondary | ICD-10-CM | POA: Diagnosis present

## 2021-12-01 DIAGNOSIS — S065XAA Traumatic subdural hemorrhage with loss of consciousness status unknown, initial encounter: Secondary | ICD-10-CM

## 2021-12-01 DIAGNOSIS — Z9483 Pancreas transplant status: Secondary | ICD-10-CM

## 2021-12-01 DIAGNOSIS — D631 Anemia in chronic kidney disease: Secondary | ICD-10-CM | POA: Diagnosis present

## 2021-12-01 DIAGNOSIS — Z79624 Long term (current) use of inhibitors of nucleotide synthesis: Secondary | ICD-10-CM

## 2021-12-01 DIAGNOSIS — D649 Anemia, unspecified: Secondary | ICD-10-CM

## 2021-12-01 DIAGNOSIS — Z992 Dependence on renal dialysis: Secondary | ICD-10-CM

## 2021-12-01 LAB — PROTIME-INR
INR: 1.3 — ABNORMAL HIGH (ref 0.8–1.2)
Prothrombin Time: 16.4 seconds — ABNORMAL HIGH (ref 11.4–15.2)

## 2021-12-01 LAB — CBC WITH DIFFERENTIAL/PLATELET
Abs Immature Granulocytes: 0.01 10*3/uL (ref 0.00–0.07)
Abs Immature Granulocytes: 0.02 10*3/uL (ref 0.00–0.07)
Basophils Absolute: 0 10*3/uL (ref 0.0–0.1)
Basophils Absolute: 0 10*3/uL (ref 0.0–0.1)
Basophils Relative: 1 %
Basophils Relative: 0 %
Eosinophils Absolute: 0.1 10*3/uL (ref 0.0–0.5)
Eosinophils Absolute: 0.1 10*3/uL (ref 0.0–0.5)
Eosinophils Relative: 5 %
Eosinophils Relative: 4 %
HCT: 20.9 % — ABNORMAL LOW (ref 36.0–46.0)
HCT: 18.8 % — ABNORMAL LOW (ref 36.0–46.0)
Hemoglobin: 6.8 g/dL — CL (ref 12.0–15.0)
Hemoglobin: 6.1 g/dL — CL (ref 12.0–15.0)
Immature Granulocytes: 0 %
Immature Granulocytes: 1 %
Lymphocytes Relative: 30 %
Lymphocytes Relative: 34 %
Lymphs Abs: 0.8 10*3/uL (ref 0.7–4.0)
Lymphs Abs: 0.9 10*3/uL (ref 0.7–4.0)
MCH: 31.3 pg (ref 26.0–34.0)
MCH: 31.3 pg (ref 26.0–34.0)
MCHC: 32.5 g/dL (ref 30.0–36.0)
MCHC: 32.4 g/dL (ref 30.0–36.0)
MCV: 96.3 fL (ref 80.0–100.0)
MCV: 96.4 fL (ref 80.0–100.0)
Monocytes Absolute: 0.2 10*3/uL (ref 0.1–1.0)
Monocytes Absolute: 0.2 10*3/uL (ref 0.1–1.0)
Monocytes Relative: 8 %
Monocytes Relative: 9 %
Neutro Abs: 1.4 10*3/uL — ABNORMAL LOW (ref 1.7–7.7)
Neutro Abs: 1.3 10*3/uL — ABNORMAL LOW (ref 1.7–7.7)
Neutrophils Relative %: 56 %
Neutrophils Relative %: 52 %
Platelets: UNDETERMINED 10*3/uL (ref 150–400)
Platelets: 115 10*3/uL — ABNORMAL LOW (ref 150–400)
RBC: 2.17 MIL/uL — ABNORMAL LOW (ref 3.87–5.11)
RBC: 1.95 MIL/uL — ABNORMAL LOW (ref 3.87–5.11)
RDW: 14.2 % (ref 11.5–15.5)
RDW: 14.1 % (ref 11.5–15.5)
WBC: 2.6 10*3/uL — ABNORMAL LOW (ref 4.0–10.5)
WBC: 2.5 10*3/uL — ABNORMAL LOW (ref 4.0–10.5)
nRBC: 0 % (ref 0.0–0.2)
nRBC: 0 % (ref 0.0–0.2)

## 2021-12-01 LAB — COMPREHENSIVE METABOLIC PANEL
ALT: 10 U/L (ref 0–44)
AST: 12 U/L — ABNORMAL LOW (ref 15–41)
Albumin: 2.5 g/dL — ABNORMAL LOW (ref 3.5–5.0)
Alkaline Phosphatase: 87 U/L (ref 38–126)
Anion gap: 11 (ref 5–15)
BUN: 27 mg/dL — ABNORMAL HIGH (ref 6–20)
CO2: 32 mmol/L (ref 22–32)
Calcium: 8.8 mg/dL — ABNORMAL LOW (ref 8.9–10.3)
Chloride: 95 mmol/L — ABNORMAL LOW (ref 98–111)
Creatinine, Ser: 6.67 mg/dL — ABNORMAL HIGH (ref 0.44–1.00)
GFR, Estimated: 8 mL/min — ABNORMAL LOW (ref >60)
Glucose, Bld: 98 mg/dL (ref 70–99)
Potassium: 5.5 mmol/L — ABNORMAL HIGH (ref 3.5–5.1)
Sodium: 138 mmol/L (ref 135–145)
Total Bilirubin: 0.7 mg/dL (ref 0.3–1.2)
Total Protein: 5.7 g/dL — ABNORMAL LOW (ref 6.5–8.1)

## 2021-12-01 LAB — I-STAT BETA HCG BLOOD, ED (MC, WL, AP ONLY): I-stat hCG, quantitative: 13.7 m[IU]/mL — ABNORMAL HIGH (ref <5)

## 2021-12-01 LAB — BRAIN NATRIURETIC PEPTIDE: B Natriuretic Peptide: 958.2 pg/mL — ABNORMAL HIGH (ref 0.0–100.0)

## 2021-12-01 LAB — TROPONIN I (HIGH SENSITIVITY)
Troponin I (High Sensitivity): 16 ng/L (ref <18)
Troponin I (High Sensitivity): 16 ng/L (ref <18)

## 2021-12-01 LAB — PREPARE RBC (CROSSMATCH)

## 2021-12-01 LAB — HCG, QUANTITATIVE, PREGNANCY: hCG, Beta Chain, Quant, S: 1 m[IU]/mL (ref <5)

## 2021-12-01 LAB — POC OCCULT BLOOD, ED: Fecal Occult Bld: POSITIVE — AB

## 2021-12-01 LAB — LIPASE, BLOOD: Lipase: 23 U/L (ref 11–51)

## 2021-12-01 IMAGING — CT CT HEAD W/O CM
4 series · 15 of 47 positions shown, 17 images · non-contrast
Comparison: Head CT dated [DATE].
COMPARISON: Head CT dated [DATE].

Addendum:
CLINICAL DATA: Subdural hemorrhage.



[Series 3: head wo · axial · 0.37mm/px · z∈[-195,-85]mm · 7 of 30 slices shown, 9 images]
[im 4/30  brain]
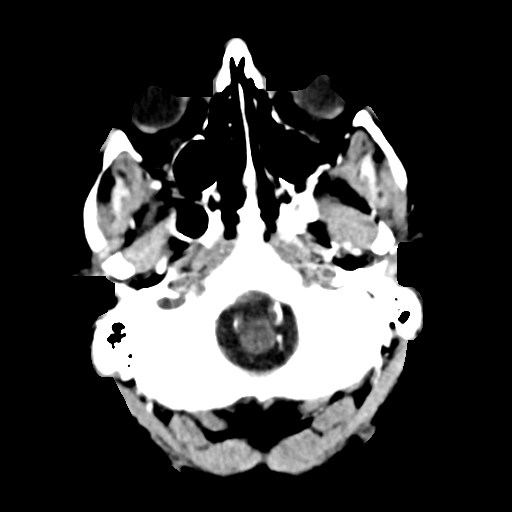
[im 4/30  bone]
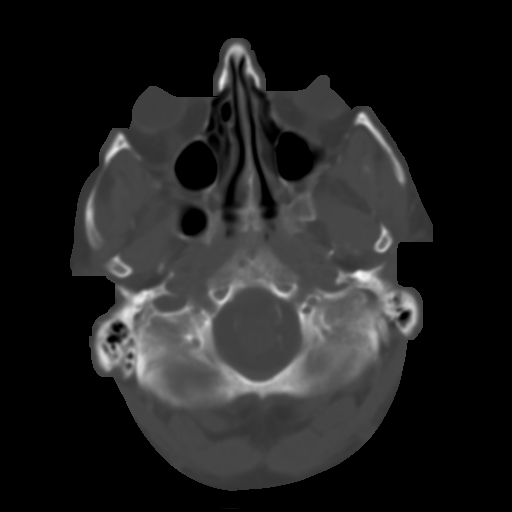
[im 8/30  brain]
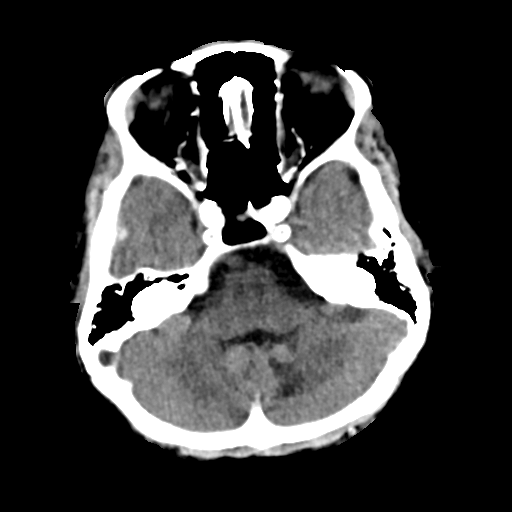
[im 11/30  brain]
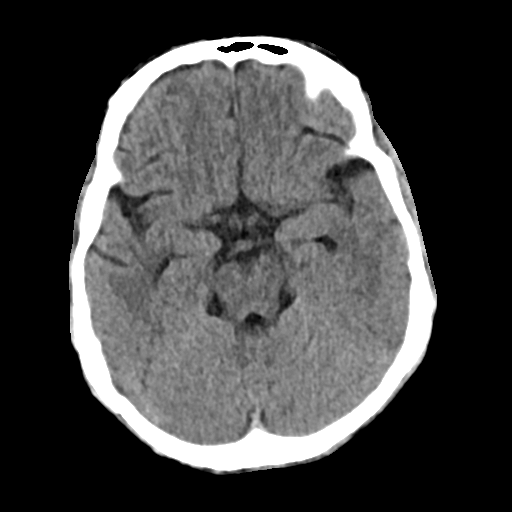
[im 15/30  brain]
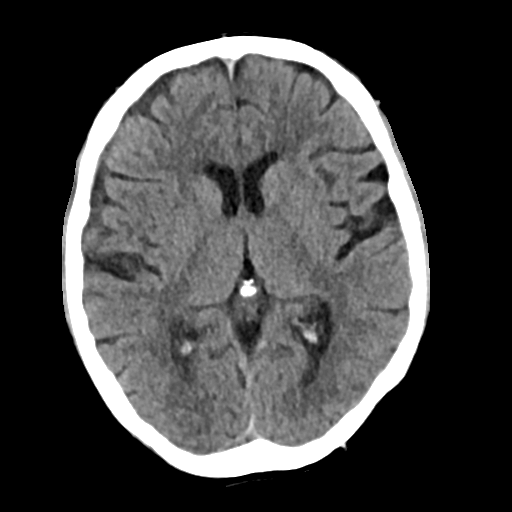
[im 19/30  brain]
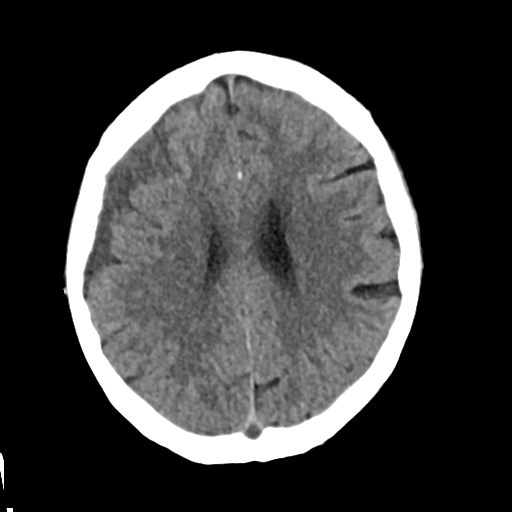
[im 19/30  bone]
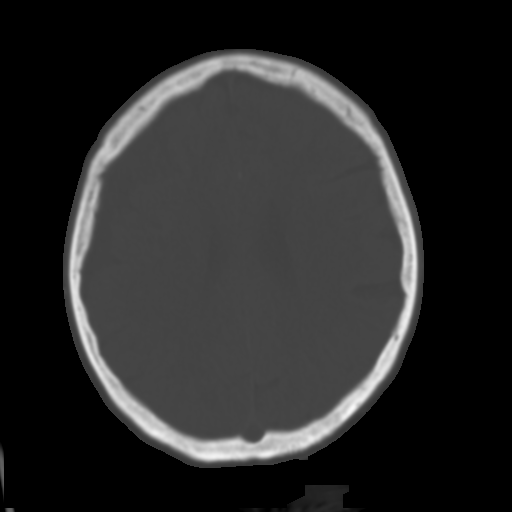
[im 22/30  brain]
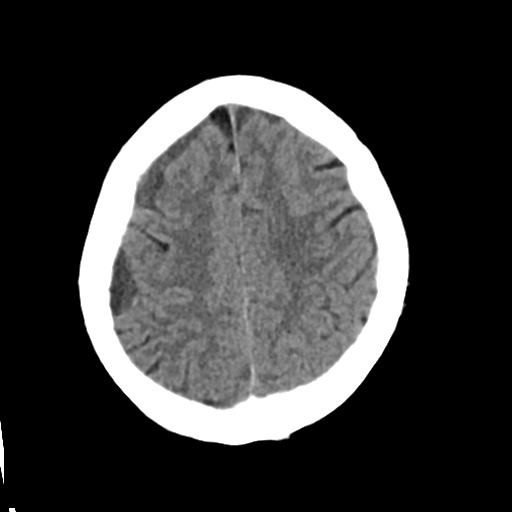
[im 26/30  brain]
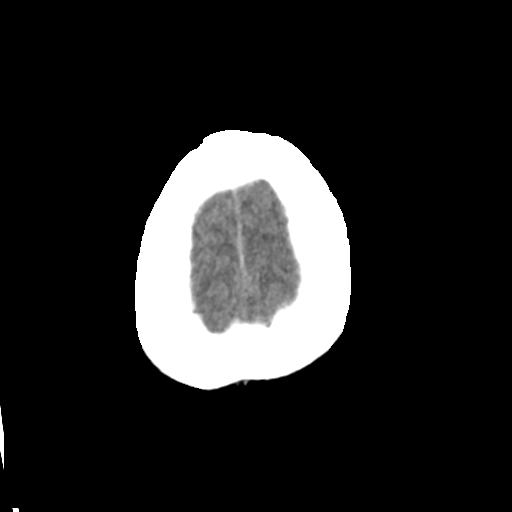

[Series 4: head bone · axial · 0.37mm/px · z∈[-196,-182]mm · 2 of 75 slices shown]
[im 8/75  bone]
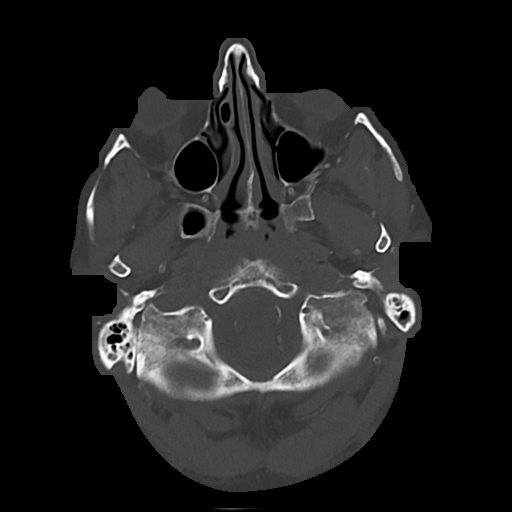
[im 15/75  bone]
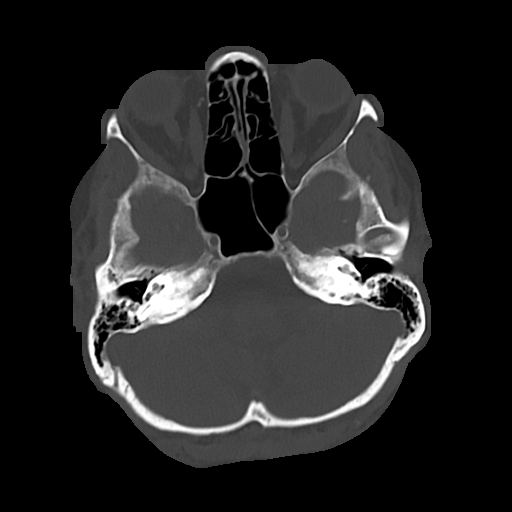

[Series 5: cor soft · coronal · 0.29mm/px · 3 of 61 slices shown]
[im 21/61  brain]
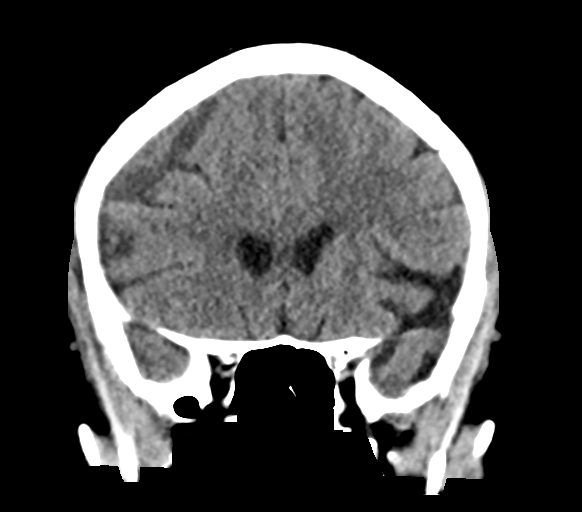
[im 27/61  brain]
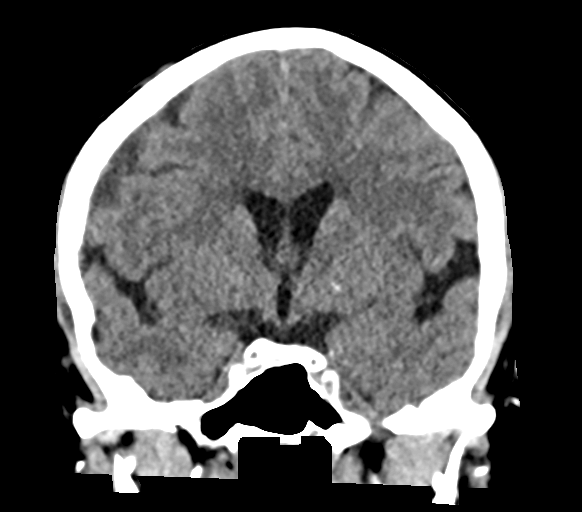
[im 34/61  brain]
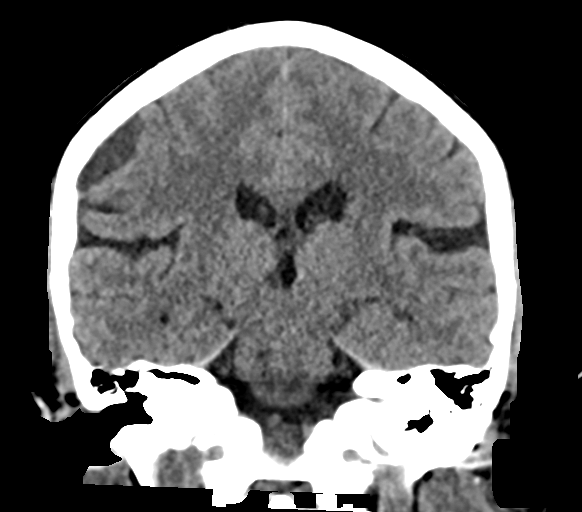

[Series 6: sag soft · sagittal · 0.29mm/px · 3 of 54 slices shown]
[im 18/54  brain]
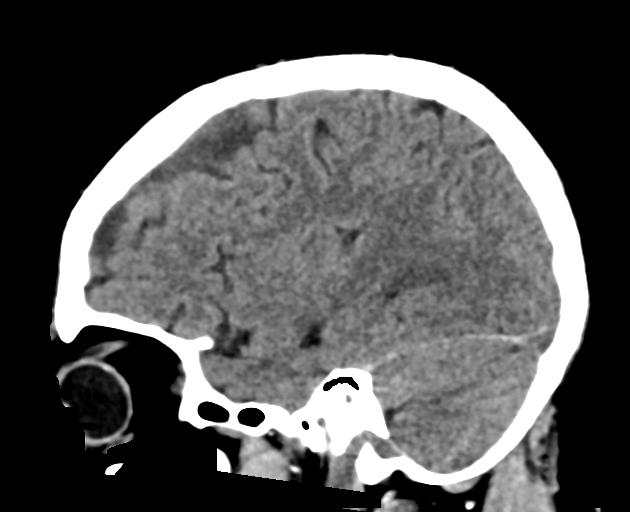
[im 27/54  brain]
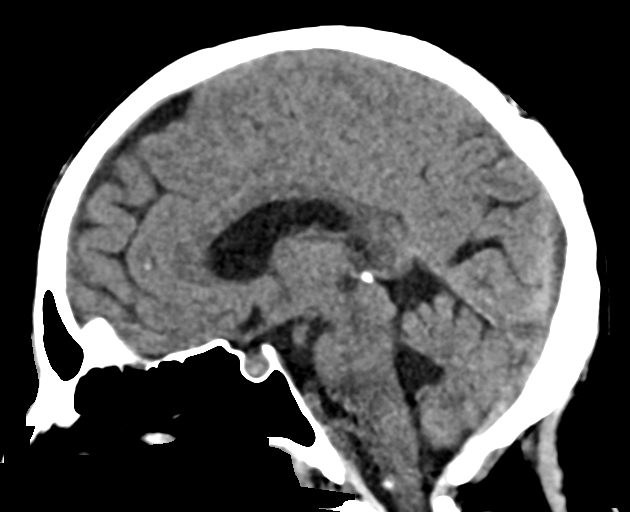
[im 36/54  brain]
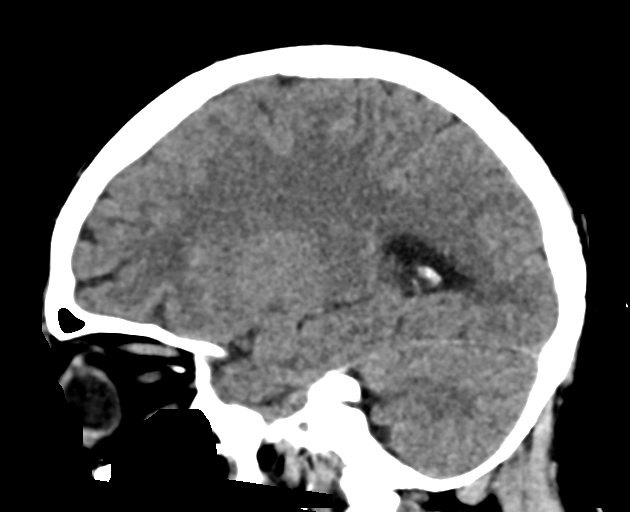

[15 of 47 positions shown; findings below may reference images not displayed]

FINDINGS: Brain: Predominantly low density right frontal subdural hemorrhage
measuring 12 mm in thickness, likely representing evolution of the
previous subdural hemorrhage. Faint small high attenuating component
within this collection noted. Overall increase in the size of the
collection over the right frontal lobe compared to prior CT. There
is associated mild mass effect and compression of the right frontal
lobe.5 no midline shift. The ventricles and sulci are appropriate
size for patient's age. The gray-white matter discrimination is
preserved.

Vascular: No hyperdense vessel or unexpected calcification.

Skull: Normal. Negative for fracture or focal lesion.

Sinuses/Orbits: No acute finding.

Other: None
IMPRESSION: Low attenuating right frontal subdural collection likely evolution
of the previously seen bleed. Mild associated mass effect and
compression the right frontal lobe. No midline shift.

These results were called by telephone at the time of interpretation
on [DATE] at [DATE] to Dr. I NYOMAN, who verbally acknowledged these
results.

ADDENDUM:
Please note there is an old left cerebellar infarct.

*** End of Addendum ***
FINDINGS: Brain: Predominantly low density right frontal subdural hemorrhage
measuring 12 mm in thickness, likely representing evolution of the
previous subdural hemorrhage. Faint small high attenuating component
within this collection noted. Overall increase in the size of the
collection over the right frontal lobe compared to prior CT. There
is associated mild mass effect and compression of the right frontal
lobe.5 no midline shift. The ventricles and sulci are appropriate
size for patient's age. The gray-white matter discrimination is
preserved.

Vascular: No hyperdense vessel or unexpected calcification.

Skull: Normal. Negative for fracture or focal lesion.

Sinuses/Orbits: No acute finding.

Other: None
IMPRESSION: Low attenuating right frontal subdural collection likely evolution
of the previously seen bleed. Mild associated mass effect and
compression the right frontal lobe. No midline shift.

These results were called by telephone at the time of interpretation
on [DATE] at [DATE] to Dr. I NYOMAN, who verbally acknowledged these
results.

## 2021-12-01 IMAGING — DX DG CHEST 1V PORT
1 series · 1 of 1 positions shown · non-contrast
Comparison: [DATE]

CLINICAL DATA: Chest pain with dizziness and headache.

EXAM:
PORTABLE CHEST 1 VIEW

[chest]
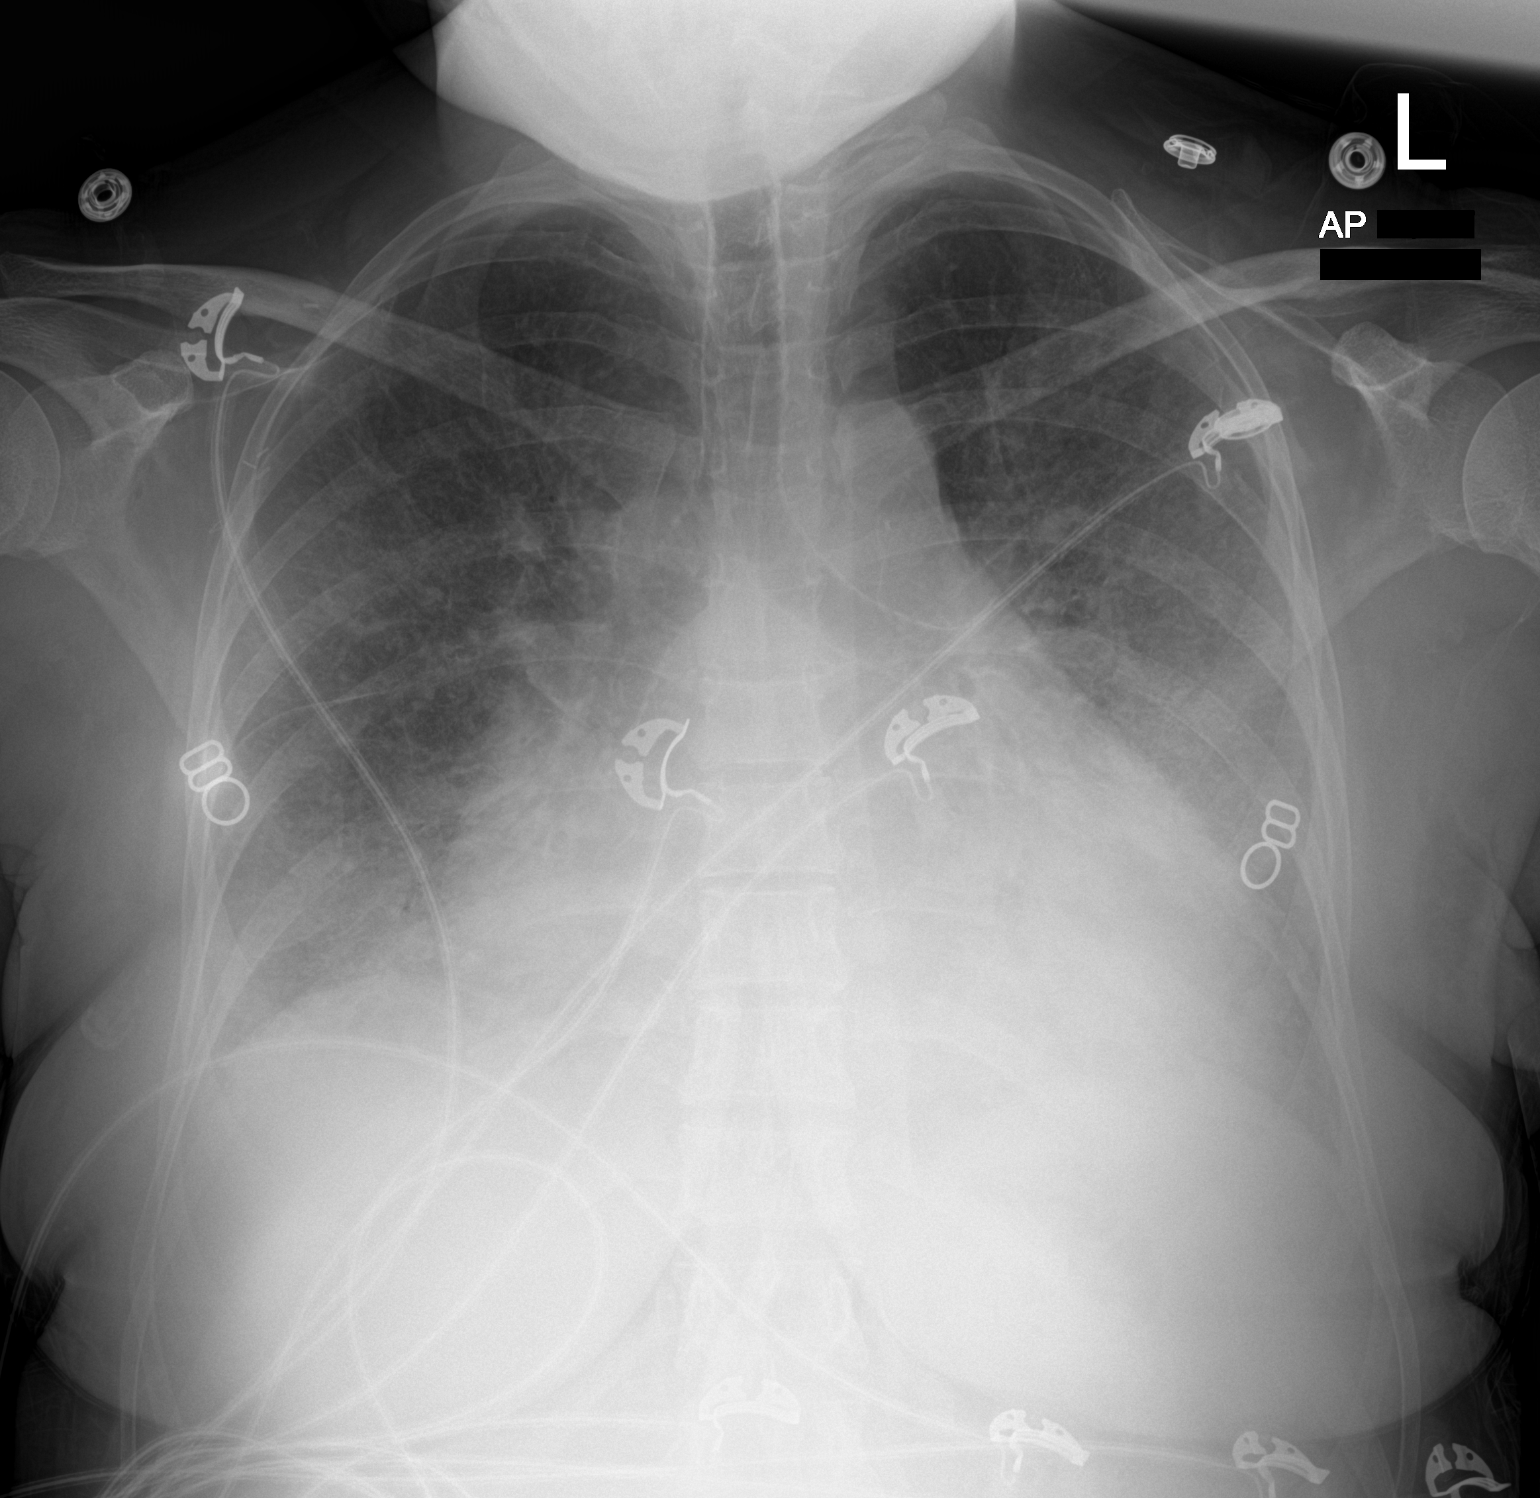

[1 of 1 positions shown; findings below may reference images not displayed]

FINDINGS: Mild, to moderate severity diffusely increased interstitial lung
markings are seen. This is increased in severity when compared to
the prior study. Mild to moderate severity airspace disease is also
seen within the bilateral lung bases and mid lung fields. There is
no evidence of a pleural effusion or pneumothorax. The cardiac
silhouette is enlarged and unchanged in size. The visualized
skeletal structures are unremarkable.
IMPRESSION: Mild to moderate severity interstitial edema with moderate severity
bilateral airspace disease.

## 2021-12-01 MED ORDER — LORAZEPAM 2 MG/ML IJ SOLN
0.5000 mg | Freq: Once | INTRAMUSCULAR | Status: AC
  Administered 2021-12-01: 0.5 mg via INTRAVENOUS
  Filled 2021-12-01: qty 1

## 2021-12-01 MED ORDER — MORPHINE SULFATE (PF) 4 MG/ML IV SOLN
4.0000 mg | Freq: Once | INTRAVENOUS | Status: AC
  Administered 2021-12-01: 4 mg via INTRAVENOUS
  Filled 2021-12-01: qty 1

## 2021-12-01 MED ORDER — PANTOPRAZOLE SODIUM 40 MG IV SOLR
40.0000 mg | Freq: Once | INTRAVENOUS | Status: AC
Start: 2021-12-01 — End: 2021-12-01
  Administered 2021-12-01: 40 mg via INTRAVENOUS
  Filled 2021-12-01: qty 10

## 2021-12-01 MED ORDER — LORAZEPAM 2 MG/ML IJ SOLN
0.2500 mg | Freq: Once | INTRAMUSCULAR | Status: AC
  Administered 2021-12-01: 0.25 mg via INTRAVENOUS
  Filled 2021-12-01: qty 1

## 2021-12-01 MED ORDER — SODIUM CHLORIDE 0.9 % IV SOLN
10.0000 mL/h | Freq: Once | INTRAVENOUS | Status: AC
  Administered 2021-12-02: 10 mL/h via INTRAVENOUS

## 2021-12-01 MED ORDER — ONDANSETRON HCL 4 MG/2ML IJ SOLN
4.0000 mg | Freq: Once | INTRAMUSCULAR | Status: AC
  Administered 2021-12-01: 4 mg via INTRAVENOUS
  Filled 2021-12-01: qty 2

## 2021-12-01 NOTE — ED Notes (Signed)
Pt signed blood consent at bedside  

## 2021-12-01 NOTE — ED Notes (Signed)
Pt refusing to go to CT unless she gets something else for nausea - PA made aware, orders to be placed  ?

## 2021-12-01 NOTE — ED Notes (Addendum)
Lab to add on HCG ?

## 2021-12-01 NOTE — ED Notes (Signed)
Patient transported to CT 

## 2021-12-01 NOTE — Progress Notes (Signed)
Patient ID: [name] [name], female   DOB: July 06, 1981, 41 y.o.   MRN: [id number] ?BP (!) 166/79   Pulse 88   Temp 98.9 ?F (37.2 ?C)   Resp 15   LMP  (LMP Unknown) Comment: pt ams  SpO2 97%  ?Head CT reviewed and comparison made to scan on 3/11. While there has been an increase in the hematoma, it is still too small to recommend operative intervention at this time. Do not believe the dizziness nor nausea is caused by the hematoma.  ?

## 2021-12-01 NOTE — ED Provider Notes (Signed)
?[place] ?Provider Note ? ? ?CSN: [id number] ?Arrival date & time: 12/01/21  1826 ? ?  ? ?History ? ?Chief Complaint  ?Patient presents with  ? Dizziness  ? Headache  ? Weakness  ? ? ?[name] [name] is a 41 y.o. female.  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 ? ?Patient states that symptoms began Monday.  She states that she began having throbbing parietal headache that she also describes as squeezing.  She denies any visual changes with this.  Denies neck pain.  She states that she has also had right-sided shoulder pain is associated with shortness of breath and palpitations that is intermittent since Monday.  She also endorses lower extremity swelling that started on Monday.  Additionally she notes right-sided abdominal pain that she also describes as throbbing which is associated with diarrhea that is nonbloody.  She has had associated fevers and chills up to 100.  She states that she has had nausea and vomiting after dialysis on Monday and Wednesday.  She states that this is new for her.  She normally does not have vomiting after dialysis. ? ?She was seen yesterday at [facility] in the ED for acute non-intractable headache.  She had CT head which showed 1.2 cm extra-axial collection overlying the right cerebral convexity consistent with acute on chronic subdural hematoma with local mass effect on surrounding parenchyma with no midline shift.  Notably she was admitted from 11/05/21 to 11/17/2021 for altered mental status.  During her admission, altered mental status was thought to be multifactorial including narcotic use, uremia secondary to intermittent compliance on dialysis.  They obtained a MRI head which found that she had  small acute infarcts of the posterior paramidline cerebellum along with small left frontal subdural hemorrhage.  There was unclear etiology of these but thought to be questionable coagulopathy from COVID-19 diagnosis. ? ? ?Dizziness ?Associated symptoms: chest pain, diarrhea, headaches, nausea, palpitations, shortness of breath, vomiting and weakness   ?Headache ?Associated symptoms: diarrhea, dizziness, fatigue, fever, nausea, vomiting and weakness   ?Weakness ?Associated symptoms: chest pain, diarrhea, dizziness, fever, headaches, nausea, shortness of breath and vomiting   ? ?  ? ?Home Medications ?Prior to Admission medications   ?Medication Sig Start Date End Date Taking? Authorizing Provider  ?amLODipine (NORVASC) 10 MG tablet Take 10 mg by mouth at bedtime. 09/19/18   [provider]  ?aspirin EC 81 MG tablet Take 81 mg by mouth daily.    [provider]  ?CATAPRES-TTS-1 0.1 MG/24HR patch Place 0.1 mg onto the skin once a week. 09/26/21   [provider]  ?colchicine 0.6 MG tablet Take 0.6 mg by mouth 3 (three) times daily as needed (gout flares). 02/28/21   [provider]  ?cycloSPORINE modified (NEORAL) 100 MG capsule Take 2 capsules (200 mg total) by mouth daily. 11/17/21   Cherene Altes, MD  ?cycloSPORINE modified (NEORAL) 100 MG capsule Take 1 capsule (100 mg total) by mouth at bedtime. 11/17/21   Cherene Altes, MD  ?Darbepoetin Alfa (ARANESP) 60 MCG/0.3ML SOSY injection Inject 0.3 mLs (60 mcg total) into the vein every Friday with hemodialysis. 11/18/21   Cherene Altes, MD  ?diphenhydrAMINE (BENADRYL) 25 MG tablet Take 25 mg by mouth every 6 (six) hours as needed for itching.  [provider]  ?doxercalciferol (HECTOROL) 4 MCG/2ML injection Inject 3 mLs (6 mcg total) into the vein every Monday, Wednesday, and Friday with hemodialysis. 11/17/21   Cherene Altes, MD  ?famotidine (PEPCID) 20 MG tablet Take 1 tablet (20 mg total) by mouth daily.  11/18/21   Cherene Altes, MD  ?ferric citrate (AURYXIA) 1 GM 210 MG(Fe) tablet Take 630 mg by mouth 3 (three) times daily with meals.    [provider]  ?fluconazole (DIFLUCAN) 100 MG tablet Take 1 tablet (100 mg total) by mouth daily. 11/18/21   Cherene Altes, MD  ?heparin 1000 unit/mL SOLN injection Heparin Sodium (Porcine) 1,000 Units/mL Catheter Lock Arterial 12/22/20 12/21/21  [provider]  ?labetalol (NORMODYNE) 200 MG tablet Take 600 mg by mouth 2 (two) times daily. 09/12/19   [provider]  ?lactulose (CHRONULAC) 10 GM/15ML solution Take 45 mLs (30 g total) by mouth 2 (two) times daily. 11/17/21   Cherene Altes, MD  ?lidocaine-prilocaine (EMLA) cream Apply 1 application topically daily as needed (port access). 07/09/20   [provider]  ?Methoxy PEG-Epoetin Beta (MIRCERA IJ) Mircera 01/03/21 01/02/22  [provider]  ?metoCLOPramide (REGLAN) 5 MG tablet Take 5 mg by mouth every 6 (six) hours as needed for nausea or vomiting. 09/03/21   [provider]  ?mycophenolate (MYFORTIC) 180 MG EC tablet Take 540 mg by mouth 2 (two) times daily.    [provider]  ?naloxone (NARCAN) nasal spray 4 mg/0.1 mL Place 1 spray into the nose as needed (opioid overdose).    [provider]  ?ondansetron (ZOFRAN) 4 MG tablet Take 4 mg by mouth 2 (two) times daily as needed for nausea or vomiting.    [provider]  ?oxyCODONE-acetaminophen (PERCOCET/ROXICET) 5-325 MG tablet Take 1-2 tablets by mouth every 6 (six) hours as needed for severe pain. ?Patient taking differently: Take 1 tablet by mouth 3 (three) times daily as needed for moderate pain. 03/22/21   [name], MD  ?predniSONE (DELTASONE) 5 MG tablet Take 15 mg by mouth daily. 06/24/15   [provider]  ?zolpidem (AMBIEN) 10 MG tablet Take 10 mg by mouth at bedtime as needed for sleep.    [provider]  ?   ? ?Allergies    ?Iodinated contrast media    ? ?Review of Systems   ?Review of Systems  ?Constitutional:  Positive for chills, fatigue and fever.  ?Respiratory:  Positive for shortness of breath.   ?Cardiovascular:  Positive for chest pain, palpitations and leg swelling.  ?Gastrointestinal:  Positive for diarrhea, nausea and vomiting.  ?Neurological:  Positive for dizziness, weakness and headaches. Negative for syncope.  ?All other systems reviewed and are negative. ? ?Physical Exam ?Updated Vital Signs ?BP (!) 156/81   Pulse 87   Temp 98.9 ?F (37.2 ?C)   Resp 12   LMP  (LMP Unknown) Comment: pt ams  SpO2 98%  ?Physical Exam ?Vitals and nursing note reviewed.  ?Constitutional:   ?   General: She is not in acute distress. ?   Appearance: Normal appearance. She is ill-appearing. She is not toxic-appearing.  ?HENT:  ?   Head: Normocephalic and atraumatic.  ?   Nose: Nose normal.  ?   Mouth/Throat:  ?   Mouth: Mucous membranes are moist.  ?   Pharynx: Oropharynx is clear.  ?Eyes:  ?   General: No visual field deficit or scleral icterus. ?   Extraocular Movements: Extraocular movements intact.  ?  Pupils: Pupils are equal, round, and reactive to light.  ?Cardiovascular:  ?   Rate and Rhythm: Normal rate and regular rhythm.  ?   Heart sounds: Murmur heard.  ?Systolic murmur is present.  ?   Comments: Pansystolic blowing murmur heard over the second intercostal space at the right sternal border ?Pulmonary:  ?   Effort: Pulmonary effort is normal. No respiratory distress.  ?   Breath sounds: Examination of the right-lower field reveals rales. Examination of the left-lower field reveals rales. Rales present.  ?Abdominal:  ?   General: Abdomen is protuberant. Bowel sounds are normal. There is no distension.  ?   Palpations: Abdomen is soft.  ?   Tenderness: There is abdominal tenderness in the right upper quadrant and right lower quadrant. There is no right CVA tenderness, left CVA tenderness, guarding or rebound.  ?Musculoskeletal:  ?   Cervical back: Neck  supple.  ?   Right lower leg: 1+ Pitting Edema present.  ?   Left lower leg: 1+ Pitting Edema present.  ?Skin: ?   General: Skin is warm and dry.  ?   Capillary Refill: Capillary refill takes less than 2 second

## 2021-12-01 NOTE — ED Notes (Signed)
IV team at bedside 

## 2021-12-01 NOTE — ED Notes (Signed)
Pt next troponin due at 2220 ?

## 2021-12-01 NOTE — ED Triage Notes (Signed)
Pt BIB [facility] from home d/t increased dizziness, weakness & HA since she was d/c from [facility] yesterday after diagnosed with a subdural hematoma. EMS reports no stroke s/s, that pt was c/o n/v, she is a dialysis pt, increased edema noted as well. 164/92, 98 bpm, NSR on the monitor while en route to ED. A/Ox4.  ?

## 2021-12-01 NOTE — ED Notes (Signed)
Pt states that she is too anxious to go to CT at this time. Additionally pt wishes to be stuck by IV team d/t being a hard stick - this RN attempted to start an IV but unsuccessful - IV team order already in - PA aware  ?

## 2021-12-01 NOTE — Subjective & Objective (Signed)
Came in with a headache on the right side has been feeling dizzy, abdominla pain , ansue and vomiitng with dairreh ? Fever up to 101, ?Nausea and vomiitng after her HD ? Stools non bloody ?In ER hg 6.8 down from 9 2 wks ago ?She was seen in [facility] for a headache repeated her scan now subdural hematoma now 12 mm ? ? ? ? ?

## 2021-12-01 NOTE — ED Notes (Signed)
PA notified of critical hgb  ?

## 2021-12-01 NOTE — Telephone Encounter (Signed)
?  Chief Complaint: Headache ?Symptoms: 10/10 headache, right sided. Seen in ED yesterday told to return if headache reoccurred, dizzy. ?Frequency: Monday ?Pertinent Negatives: Patient denies  ?Disposition: '[x]'$ ED /'[]'$ Urgent Care (no appt availability in office) / '[]'$ Appointment(In office/virtual)/ '[]'$  [facility] [facility]/ '[]'$ Home Care/ '[]'$ Refused Recommended Disposition /'[]'$ [place] [facility]/ '[]'$  Follow-up with PCP ?Additional Notes: States will follow disposition, calling EMS. ?Reason for Disposition ? [1] SEVERE headache (e.g., excruciating) AND [2] "worst headache" of life ? ?Answer Assessment - Initial Assessment Questions ?1. LOCATION: "Where does it hurt?"  ?    Right side ?2. ONSET: "When did the headache start?" (Minutes, hours or days)  ?    Monday ?3. PATTERN: "Does the pain come and go, or has it been constant since it started?" ?    Gradually ?4. SEVERITY: "How bad is the pain?" and "What does it keep you from doing?"  (e.g., Scale 1-10; mild, moderate, or severe) ?  - MILD (1-3): doesn't interfere with normal activities  ?  - MODERATE (4-7): interferes with normal activities or awakens from sleep  ?  - SEVERE (8-10): excruciating pain, unable to do any normal activities    ?    10/10 ?5. RECURRENT SYMPTOM: "Have you ever had headaches before?" If Yes, ask: "When was the last time?" and "What happened that time?"  ?    no ?6. CAUSE: "What do you think is causing the headache?" ?     ?7. MIGRAINE: "Have you been diagnosed with migraine headaches?" If Yes, ask: "Is this headache similar?"  ?     ?8. HEAD INJURY: "Has there been any recent injury to the head?"  ?    no ?9. OTHER SYMPTOMS: "Do you have any other symptoms?" (fever, stiff neck, eye pain, sore throat, cold symptoms) ?    Dizzy ? ?Protocols used: Headache-A-AH ? ?

## 2021-12-02 DIAGNOSIS — S065XAA Traumatic subdural hemorrhage with loss of consciousness status unknown, initial encounter: Secondary | ICD-10-CM

## 2021-12-02 DIAGNOSIS — E8770 Fluid overload, unspecified: Secondary | ICD-10-CM | POA: Diagnosis present

## 2021-12-02 DIAGNOSIS — Z7901 Long term (current) use of anticoagulants: Secondary | ICD-10-CM | POA: Diagnosis not present

## 2021-12-02 DIAGNOSIS — F419 Anxiety disorder, unspecified: Secondary | ICD-10-CM | POA: Diagnosis not present

## 2021-12-02 DIAGNOSIS — G8929 Other chronic pain: Secondary | ICD-10-CM

## 2021-12-02 DIAGNOSIS — Z91041 Radiographic dye allergy status: Secondary | ICD-10-CM | POA: Diagnosis not present

## 2021-12-02 DIAGNOSIS — R9389 Abnormal findings on diagnostic imaging of other specified body structures: Secondary | ICD-10-CM

## 2021-12-02 DIAGNOSIS — Z8616 Personal history of COVID-19: Secondary | ICD-10-CM | POA: Diagnosis not present

## 2021-12-02 DIAGNOSIS — Z7952 Long term (current) use of systemic steroids: Secondary | ICD-10-CM | POA: Diagnosis not present

## 2021-12-02 DIAGNOSIS — Z7982 Long term (current) use of aspirin: Secondary | ICD-10-CM | POA: Diagnosis not present

## 2021-12-02 DIAGNOSIS — D631 Anemia in chronic kidney disease: Secondary | ICD-10-CM | POA: Diagnosis present

## 2021-12-02 DIAGNOSIS — N186 End stage renal disease: Secondary | ICD-10-CM | POA: Diagnosis present

## 2021-12-02 DIAGNOSIS — D696 Thrombocytopenia, unspecified: Secondary | ICD-10-CM

## 2021-12-02 DIAGNOSIS — R935 Abnormal findings on diagnostic imaging of other abdominal regions, including retroperitoneum: Secondary | ICD-10-CM

## 2021-12-02 DIAGNOSIS — N2581 Secondary hyperparathyroidism of renal origin: Secondary | ICD-10-CM | POA: Diagnosis present

## 2021-12-02 DIAGNOSIS — R109 Unspecified abdominal pain: Secondary | ICD-10-CM

## 2021-12-02 DIAGNOSIS — R519 Headache, unspecified: Secondary | ICD-10-CM | POA: Diagnosis present

## 2021-12-02 DIAGNOSIS — D72819 Decreased white blood cell count, unspecified: Secondary | ICD-10-CM | POA: Diagnosis present

## 2021-12-02 DIAGNOSIS — Z992 Dependence on renal dialysis: Secondary | ICD-10-CM | POA: Diagnosis not present

## 2021-12-02 DIAGNOSIS — D500 Iron deficiency anemia secondary to blood loss (chronic): Secondary | ICD-10-CM | POA: Diagnosis not present

## 2021-12-02 DIAGNOSIS — D62 Acute posthemorrhagic anemia: Secondary | ICD-10-CM | POA: Diagnosis present

## 2021-12-02 DIAGNOSIS — Z79899 Other long term (current) drug therapy: Secondary | ICD-10-CM | POA: Diagnosis not present

## 2021-12-02 DIAGNOSIS — T39395A Adverse effect of other nonsteroidal anti-inflammatory drugs [NSAID], initial encounter: Secondary | ICD-10-CM | POA: Diagnosis present

## 2021-12-02 DIAGNOSIS — Z87891 Personal history of nicotine dependence: Secondary | ICD-10-CM | POA: Diagnosis not present

## 2021-12-02 DIAGNOSIS — K2970 Gastritis, unspecified, without bleeding: Secondary | ICD-10-CM | POA: Diagnosis not present

## 2021-12-02 DIAGNOSIS — I160 Hypertensive urgency: Secondary | ICD-10-CM | POA: Diagnosis present

## 2021-12-02 DIAGNOSIS — I120 Hypertensive chronic kidney disease with stage 5 chronic kidney disease or end stage renal disease: Secondary | ICD-10-CM | POA: Diagnosis present

## 2021-12-02 DIAGNOSIS — E875 Hyperkalemia: Secondary | ICD-10-CM | POA: Diagnosis present

## 2021-12-02 DIAGNOSIS — Z9483 Pancreas transplant status: Secondary | ICD-10-CM | POA: Diagnosis not present

## 2021-12-02 DIAGNOSIS — I6203 Nontraumatic chronic subdural hemorrhage: Secondary | ICD-10-CM | POA: Diagnosis present

## 2021-12-02 DIAGNOSIS — Z8673 Personal history of transient ischemic attack (TIA), and cerebral infarction without residual deficits: Secondary | ICD-10-CM | POA: Diagnosis not present

## 2021-12-02 DIAGNOSIS — T8612 Kidney transplant failure: Secondary | ICD-10-CM | POA: Diagnosis present

## 2021-12-02 DIAGNOSIS — E109 Type 1 diabetes mellitus without complications: Secondary | ICD-10-CM

## 2021-12-02 DIAGNOSIS — K2971 Gastritis, unspecified, with bleeding: Secondary | ICD-10-CM | POA: Diagnosis present

## 2021-12-02 DIAGNOSIS — K3189 Other diseases of stomach and duodenum: Secondary | ICD-10-CM | POA: Diagnosis not present

## 2021-12-02 LAB — HEMOGLOBIN AND HEMATOCRIT, BLOOD
HCT: 25.9 % — ABNORMAL LOW (ref 36.0–46.0)
Hemoglobin: 8.7 g/dL — ABNORMAL LOW (ref 12.0–15.0)

## 2021-12-02 LAB — FERRITIN: Ferritin: 814 ng/mL — ABNORMAL HIGH (ref 11–307)

## 2021-12-02 LAB — CBG MONITORING, ED
Glucose-Capillary: 73 mg/dL (ref 70–99)
Glucose-Capillary: 75 mg/dL (ref 70–99)
Glucose-Capillary: 99 mg/dL (ref 70–99)

## 2021-12-02 LAB — CBC
HCT: 23.3 % — ABNORMAL LOW (ref 36.0–46.0)
Hemoglobin: 7.3 g/dL — ABNORMAL LOW (ref 12.0–15.0)
MCH: 30.2 pg (ref 26.0–34.0)
MCHC: 31.3 g/dL (ref 30.0–36.0)
MCV: 96.3 fL (ref 80.0–100.0)
Platelets: 127 10*3/uL — ABNORMAL LOW (ref 150–400)
RBC: 2.42 MIL/uL — ABNORMAL LOW (ref 3.87–5.11)
RDW: 15.2 % (ref 11.5–15.5)
WBC: 3.3 10*3/uL — ABNORMAL LOW (ref 4.0–10.5)
nRBC: 0 % (ref 0.0–0.2)

## 2021-12-02 LAB — BASIC METABOLIC PANEL
Anion gap: 13 (ref 5–15)
BUN: 29 mg/dL — ABNORMAL HIGH (ref 6–20)
CO2: 28 mmol/L (ref 22–32)
Calcium: 8.6 mg/dL — ABNORMAL LOW (ref 8.9–10.3)
Chloride: 97 mmol/L — ABNORMAL LOW (ref 98–111)
Creatinine, Ser: 7.33 mg/dL — ABNORMAL HIGH (ref 0.44–1.00)
GFR, Estimated: 7 mL/min — ABNORMAL LOW (ref >60)
Glucose, Bld: 83 mg/dL (ref 70–99)
Potassium: 5.5 mmol/L — ABNORMAL HIGH (ref 3.5–5.1)
Sodium: 138 mmol/L (ref 135–145)

## 2021-12-02 LAB — RETICULOCYTES
Immature Retic Fract: 25.1 % — ABNORMAL HIGH (ref 2.3–15.9)
RBC.: 2.39 MIL/uL — ABNORMAL LOW (ref 3.87–5.11)
Retic Count, Absolute: 80.3 10*3/uL (ref 19.0–186.0)
Retic Ct Pct: 3.4 % — ABNORMAL HIGH (ref 0.4–3.1)

## 2021-12-02 LAB — PROCALCITONIN: Procalcitonin: 0.39 ng/mL

## 2021-12-02 LAB — PREPARE RBC (CROSSMATCH)

## 2021-12-02 LAB — VITAMIN B12: Vitamin B-12: 744 pg/mL (ref 180–914)

## 2021-12-02 LAB — GLUCOSE, CAPILLARY
Glucose-Capillary: 101 mg/dL — ABNORMAL HIGH (ref 70–99)
Glucose-Capillary: 90 mg/dL (ref 70–99)

## 2021-12-02 LAB — IRON AND TIBC
Iron: 39 ug/dL (ref 28–170)
Saturation Ratios: 23 % (ref 10.4–31.8)
TIBC: 172 ug/dL — ABNORMAL LOW (ref 250–450)
UIBC: 133 ug/dL

## 2021-12-02 LAB — FOLATE: Folate: 16.1 ng/mL (ref >5.9)

## 2021-12-02 MED ORDER — MYCOPHENOLATE SODIUM 180 MG PO TBEC
540.0000 mg | DELAYED_RELEASE_TABLET | Freq: Two times a day (BID) | ORAL | Status: DC
  Administered 2021-12-02 – 2021-12-04 (×5): 540 mg via ORAL
  Filled 2021-12-02 (×8): qty 3

## 2021-12-02 MED ORDER — ACETAMINOPHEN 650 MG RE SUPP
650.0000 mg | Freq: Four times a day (QID) | RECTAL | Status: DC | PRN
Start: 2021-12-02 — End: 2021-12-04

## 2021-12-02 MED ORDER — DEXTROSE 50 % IV SOLN: 1.0000 | Freq: Four times a day (QID) | INTRAVENOUS | Status: DC | PRN

## 2021-12-02 MED ORDER — CLONIDINE HCL 0.1 MG/24HR TD PTWK
0.1000 mg | MEDICATED_PATCH | TRANSDERMAL | Status: DC
  Administered 2021-12-02: 0.1 mg via TRANSDERMAL
  Filled 2021-12-02: qty 1

## 2021-12-02 MED ORDER — HYDROMORPHONE HCL 1 MG/ML IJ SOLN
1.0000 mg | Freq: Once | INTRAMUSCULAR | Status: AC
  Administered 2021-12-02: 1 mg via INTRAVENOUS
  Filled 2021-12-02: qty 1

## 2021-12-02 MED ORDER — CHLORHEXIDINE GLUCONATE CLOTH 2 % EX PADS
6.0000 | MEDICATED_PAD | Freq: Every day | CUTANEOUS | Status: DC
  Administered 2021-12-03 – 2021-12-04 (×2): 6 via TOPICAL

## 2021-12-02 MED ORDER — CYCLOSPORINE MODIFIED (NEORAL) 100 MG PO CAPS
200.0000 mg | ORAL_CAPSULE | Freq: Two times a day (BID) | ORAL | Status: DC
  Filled 2021-12-02: qty 2

## 2021-12-02 MED ORDER — PREDNISONE 5 MG PO TABS
15.0000 mg | ORAL_TABLET | Freq: Every day | ORAL | Status: DC
  Administered 2021-12-02 – 2021-12-04 (×3): 15 mg via ORAL
  Filled 2021-12-02: qty 1
  Filled 2021-12-02 (×2): qty 3

## 2021-12-02 MED ORDER — SODIUM CHLORIDE 0.9% IV SOLUTION
Freq: Once | INTRAVENOUS | Status: AC
  Administered 2021-12-03: 50 mL/h via INTRAVENOUS

## 2021-12-02 MED ORDER — NALOXONE HCL 0.4 MG/ML IJ SOLN: 0.4000 mg | INTRAMUSCULAR | Status: DC | PRN

## 2021-12-02 MED ORDER — CYCLOSPORINE MODIFIED (NEORAL) 100 MG PO CAPS
100.0000 mg | ORAL_CAPSULE | Freq: Two times a day (BID) | ORAL | Status: DC
  Administered 2021-12-02 – 2021-12-04 (×5): 100 mg via ORAL
  Filled 2021-12-02 (×7): qty 1

## 2021-12-02 MED ORDER — ONDANSETRON HCL 4 MG/2ML IJ SOLN
4.0000 mg | Freq: Four times a day (QID) | INTRAMUSCULAR | Status: DC | PRN
  Administered 2021-12-02 – 2021-12-03 (×4): 4 mg via INTRAVENOUS
  Filled 2021-12-02 (×4): qty 2

## 2021-12-02 MED ORDER — PANTOPRAZOLE SODIUM 40 MG IV SOLR
40.0000 mg | Freq: Two times a day (BID) | INTRAVENOUS | Status: DC
  Administered 2021-12-02 (×2): 40 mg via INTRAVENOUS
  Filled 2021-12-02 (×2): qty 10

## 2021-12-02 MED ORDER — SODIUM ZIRCONIUM CYCLOSILICATE 10 G PO PACK
10.0000 g | PACK | Freq: Once | ORAL | Status: AC
Start: 2021-12-02 — End: 2021-12-02
  Administered 2021-12-02: 10 g via ORAL
  Filled 2021-12-02: qty 1

## 2021-12-02 MED ORDER — ACETAMINOPHEN 325 MG PO TABS
650.0000 mg | ORAL_TABLET | Freq: Four times a day (QID) | ORAL | Status: DC | PRN
  Administered 2021-12-02: 650 mg via ORAL
  Filled 2021-12-02: qty 2

## 2021-12-02 MED ORDER — SODIUM CHLORIDE 0.9% IV SOLUTION
Freq: Once | INTRAVENOUS | Status: AC
Start: 2021-12-02 — End: 2021-12-03

## 2021-12-02 MED ORDER — PEG 3350-KCL-NA BICARB-NACL 420 G PO SOLR
4000.0000 mL | Freq: Once | ORAL | Status: AC
  Administered 2021-12-02: 4000 mL via ORAL
  Filled 2021-12-02: qty 4000

## 2021-12-02 MED ORDER — HYDROMORPHONE HCL 1 MG/ML IJ SOLN
0.5000 mg | INTRAMUSCULAR | Status: DC | PRN
  Administered 2021-12-02 – 2021-12-04 (×15): 0.5 mg via INTRAVENOUS
  Filled 2021-12-02 (×7): qty 0.5
  Filled 2021-12-02 (×2): qty 1
  Filled 2021-12-02 (×6): qty 0.5

## 2021-12-02 MED ORDER — OXYCODONE HCL 5 MG PO TABS
5.0000 mg | ORAL_TABLET | ORAL | Status: DC | PRN
  Administered 2021-12-02 – 2021-12-04 (×6): 5 mg via ORAL
  Filled 2021-12-02 (×6): qty 1

## 2021-12-02 MED ORDER — HYDRALAZINE HCL 20 MG/ML IJ SOLN
5.0000 mg | INTRAMUSCULAR | Status: DC | PRN
  Administered 2021-12-02 – 2021-12-04 (×5): 5 mg via INTRAVENOUS
  Filled 2021-12-02 (×5): qty 1

## 2021-12-02 MED ORDER — OXYCODONE HCL 5 MG PO TABS: 5.0000 mg | ORAL_TABLET | Freq: Four times a day (QID) | ORAL | Status: DC | PRN

## 2021-12-02 MED ORDER — PREDNISONE 5 MG PO TABS
5.0000 mg | ORAL_TABLET | Freq: Three times a day (TID) | ORAL | Status: DC
  Filled 2021-12-02: qty 1

## 2021-12-02 NOTE — Consult Note (Signed)
UNASSIGNED CONSULT ? ?Reason for Consult:IDA ?Referring Physician: [name] ? 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  She was recently hospitalized for encephalopathy that was multifactorial: uremia, subdural hematoma, polypharmacy, and an acute CVA.  At that time she was being treated for COVID and there was suspicion by neurology that she had small vessel disease versus COVID-induced hypercoagulability.  With her headache she started to take Excederin 2 tablets every 2-3 hours for the past week.  The patient's HGB on admission was at 6.8 g/dL and it dropped down to 6.1 g/dL with IV hydration.  Her discharge HGB on 11/17/2021 was at 9.2 g/dL and her baseline value over the past year varied between 8-12 g/dL.  She denies any prior EGD or colonoscopy.  With this admission she does not report any problems with melena, hematochezia, or hematemesis.  There is a complaint of right sided abdominal pain  ? ?Past Medical History:  ?Diagnosis Date  ? Anemia   ? Anxiety   ? ESRD (end stage renal disease) ([place])   ? Henmodialysis  ? Gastroparesis   ? Hypertension   ? Narcotic abuse ([place])   ? Non compliance with medical treatment   ? Renal disorder 2015  ? transplant  ? Vision loss   ? ? ?Past Surgical History:  ?Procedure Laterality Date  ? A/V FISTULAGRAM Right 02/17/2021  ? Procedure: A/V FISTULAGRAM;  Surgeon: [name], MD;  Location: [facility];  Service: Cardiovascular;  Laterality: Right;  ? AV FISTULA PLACEMENT  11/17/2011  ? Procedure: ARTERIOVENOUS (AV) FISTULA CREATION;  Surgeon: Rosetta Posner, MD;  Location: [place] [facility];  Service: Vascular;   Laterality: Right;  ? EYE SURGERY    ? HEMATOMA EVACUATION Right 02/25/2021  ? Procedure: EVACUATION HEMATOMA RIGHT CHEST;  Surgeon: Waynetta Sandy, MD;  Location: [place];  Service: Vascular;  Laterality: Right;  ? INSERTION OF DIALYSIS CATHETER Right 12/08/2020  ? Procedure: INSERTION OF TUNNELED DIALYSIS CATHETER;  Surgeon: [name], MD;  Location: [facility];  Service: Vascular;  Laterality: Right;  ? IR REMOVAL TUN CV CATH W/O FL  05/03/2021  ? KIDNEY TRANSPLANT    ? NEPHRECTOMY TRANSPLANTED ORGAN    ? pancrease transplant    ? PERIPHERAL VASCULAR BALLOON ANGIOPLASTY Right 02/17/2021  ? Procedure: PERIPHERAL VASCULAR BALLOON ANGIOPLASTY;  Surgeon: [name], MD;  Location: [place] [facility];  Service: Cardiovascular;  Laterality: Right;  ? REFRACTIVE SURGERY    ? REVISON OF ARTERIOVENOUS FISTULA Right 12/08/2020  ? Procedure: RIGHT ARM ARTERIOVENOUS FISTULA REVISION AND RESECTION;  Surgeon: [name], MD;  Location: [place];  Service: Vascular;  Laterality: Right;  ? REVISON OF ARTERIOVENOUS FISTULA Right 02/25/2021  ? Procedure: RIGHT ARM ARTERIOVENOUS FISTULA REVISION WITH TRANSPOSITION OF CEPHALIC VEIN ON AXILLARY VEIN;  Surgeon: [name], MD;  Location: [place];  Service: Vascular;  Laterality: Right;  ? ? ?Family History  ?Problem Relation Age of Onset  ? Hypertension Father   ? ? ?Social History:  reports that she has quit smoking. Her smoking use included pipe. She has never used smokeless tobacco. She reports that she does not drink alcohol and  does not use drugs. ? ?Allergies:  ?Allergies  ?Allergen Reactions  ? Iodinated Contrast Media   ?  Other reaction(s): Unknown  ? ? ?Medications: Scheduled: ? sodium chloride   Intravenous Once  ? sodium chloride   Intravenous Once  ? Chlorhexidine Gluconate Cloth  6 each Topical Q0600  ? cloNIDine  0.1 mg Transdermal Weekly  ? cycloSPORINE modified  100 mg Oral BID  ? mycophenolate  540 mg Oral BID  ? pantoprazole  (PROTONIX) IV  40 mg Intravenous Q12H  ? polyethylene glycol-electrolytes  4,000 mL Oral Once  ? predniSONE  15 mg Oral Q breakfast  ? ?Continuous: ? ?Results for orders placed or performed during the hospital encounter of 12/01/21 (from the past 24 hour(s))  ?Comprehensive metabolic panel     Status: Abnormal  ? Collection Time: 12/01/21  8:21 PM  ?Result Value Ref Range  ? Sodium 138 135 - 145 mmol/L  ? Potassium 5.5 (H) 3.5 - 5.1 mmol/L  ? Chloride 95 (L) 98 - 111 mmol/L  ? CO2 32 22 - 32 mmol/L  ? Glucose, Bld 98 70 - 99 mg/dL  ? BUN 27 (H) 6 - 20 mg/dL  ? Creatinine, Ser 6.67 (H) 0.44 - 1.00 mg/dL  ? Calcium 8.8 (L) 8.9 - 10.3 mg/dL  ? Total Protein 5.7 (L) 6.5 - 8.1 g/dL  ? Albumin 2.5 (L) 3.5 - 5.0 g/dL  ? AST 12 (L) 15 - 41 U/L  ? ALT 10 0 - 44 U/L  ? Alkaline Phosphatase 87 38 - 126 U/L  ? Total Bilirubin 0.7 0.3 - 1.2 mg/dL  ? GFR, Estimated 8 (L) >60 mL/min  ? Anion gap 11 5 - 15  ?Lipase, blood     Status: None  ? Collection Time: 12/01/21  8:21 PM  ?Result Value Ref Range  ? Lipase 23 11 - 51 U/L  ?Troponin I (High Sensitivity)     Status: None  ? Collection Time: 12/01/21  8:21 PM  ?Result Value Ref Range  ? Troponin I (High Sensitivity) 16 <18 ng/L  ?CBC with Differential     Status: Abnormal  ? Collection Time: 12/01/21  8:21 PM  ?Result Value Ref Range  ? WBC 2.6 (L) 4.0 - 10.5 K/uL  ? RBC 2.17 (L) 3.87 - 5.11 MIL/uL  ? Hemoglobin 6.8 (LL) 12.0 - 15.0 g/dL  ? HCT 20.9 (L) 36.0 - 46.0 %  ? MCV 96.3 80.0 - 100.0 fL  ? MCH 31.3 26.0 - 34.0 pg  ? MCHC 32.5 30.0 - 36.0 g/dL  ? RDW 14.2 11.5 - 15.5 %  ? Platelets PLATELET CLUMPS NOTED ON SMEAR, UNABLE TO ESTIMATE 150 - 400 K/uL  ? nRBC 0.0 0.0 - 0.2 %  ? Neutrophils Relative % 56 %  ? Neutro Abs 1.4 (L) 1.7 - 7.7 K/uL  ? Lymphocytes Relative 30 %  ? Lymphs Abs 0.8 0.7 - 4.0 K/uL  ? Monocytes Relative 8 %  ? Monocytes Absolute 0.2 0.1 - 1.0 K/uL  ? Eosinophils Relative 5 %  ? Eosinophils Absolute 0.1 0.0 - 0.5 K/uL  ? Basophils Relative 1 %  ? Basophils  Absolute 0.0 0.0 - 0.1 K/uL  ? Immature Granulocytes 0 %  ? Abs Immature Granulocytes 0.01 0.00 - 0.07 K/uL  ?Brain natriuretic peptide     Status: Abnormal  ? Collection Time: 12/01/21  8:21 PM  ?Result Value Ref Range  ? B Natriuretic Peptide 958.2 (H) 0.0 - 100.0 pg/mL  ?Type and  screen [place]     Status: None (Preliminary result)  ? Collection Time: 12/01/21  9:38 PM  ?Result Value Ref Range  ? ABO/RH(D) O POS   ? Antibody Screen NEG   ? Sample Expiration    ?  12/04/2021,2359 ?Performed at [facility], [place] [address], [place], [place] [address] ?  ? Unit Number [id number]   ? Blood Component Type RED CELLS,LR   ? Unit division 00   ? Status of Unit ISSUED   ? Transfusion Status OK TO TRANSFUSE   ? Crossmatch Result Compatible   ? Unit Number [id number]   ? Blood Component Type RBC LR PHER1   ? Unit division 00   ? Status of Unit ALLOCATED   ? Transfusion Status OK TO TRANSFUSE   ? Crossmatch Result Compatible   ?Protime-INR     Status: Abnormal  ? Collection Time: 12/01/21  9:38 PM  ?Result Value Ref Range  ? Prothrombin Time 16.4 (H) 11.4 - 15.2 seconds  ? INR 1.3 (H) 0.8 - 1.2  ?Prepare RBC (crossmatch)     Status: None  ? Collection Time: 12/01/21  9:38 PM  ?Result Value Ref Range  ? Order Confirmation    ?  ORDER PROCESSED BY BLOOD BANK ?Performed at [facility], [place] [address] [place], [place] [address] ?  ?Troponin I (High Sensitivity)     Status: None  ? Collection Time: 12/01/21 10:07 PM  ?Result Value Ref Range  ? Troponin I (High Sensitivity) 16 <18 ng/L  ?hCG, quantitative, pregnancy     Status: None  ? Collection Time: 12/01/21 10:07 PM  ?Result Value Ref Range  ? hCG, Beta Chain, Quant, S 1 <5 mIU/mL  ?CBC with Differential     Status: Abnormal  ? Collection Time: 12/01/21 10:11 PM  ?Result Value Ref Range  ? WBC 2.5 (L) 4.0 - 10.5 K/uL  ? RBC 1.95 (L) 3.87 - 5.11 MIL/uL  ? Hemoglobin 6.1 (LL) 12.0 - 15.0 g/dL  ? HCT 18.8 (L) 36.0 - 46.0 %  ? MCV 96.4 80.0  - 100.0 fL  ? MCH 31.3 26.0 - 34.0 pg  ? MCHC 32.4 30.0 - 36.0 g/dL  ? RDW 14.1 11.5 - 15.5 %  ? Platelets 115 (L) 150 - 400 K/uL  ? nRBC 0.0 0.0 - 0.2 %  ? Neutrophils Relative % 52 %  ? Neutro Abs 1.3 (L) 1

## 2021-12-02 NOTE — Hospital Course (Signed)
[name] [name] is a 41 year old female with past medical history significant for ESRD on HD MWF, type 1 diabetes mellitus, renal/pancreatic transplant with subsequent renal transplant failure due to antirejection medication nonadherence, HTN, gastroparesis, gout, anxiety, anemia of chronic kidney disease, thrombocytopenia, narcotic abuse who presented to [facility] ED on 3/30 complaining of persistent severe headache, dizziness, nausea/vomiting, chest and abdominal discomfort.  Patient reports 3-day history of severe right-sided headache, weakness of the entire right side of her body, nausea/vomiting, right-sided abdominal pain, chest pain and shortness of breath.  Symptoms started after dialysis on Monday and have persisted since.  She also went to dialysis on Wednesday and reported full session.  Denies any history of falls or head injury.  Denies hematemesis, no hematochezia, no melena.  Reports morphine does not help with her headache and requesting Dilaudid.  Has not been taking aspirin for several weeks and she has run out. ? ?Recently admitted 3/4-3/16 for acute onset confusion.  She was also treated for COVID-pneumonia.  She was admitted to the ICU with acute encephalopathy and progressively worsening respiratory status.  Encephalopathy was felt to be multifactorial due to uremia, acute CVA, subdural hematoma, polypharmacy/opiate overdose, and elevated ammonia.  LP was done and without evidence of meningitis.  No seizure on EEG.  She was continued on lactulose and narcotic dose decreased.  MRI of brain done 3/8 noted small acute infarcts in posterior paramidline cerebellum bilaterally.  Neurology suspected concomitant small vessel disease versus hypercoagulability from COVID, no large vessel disease.  Anterior left frontal subdural hemorrhage noted initially on MRI 3/8, unclear etiology and felt to be possibly related to coagulopathy. Neurosurgery recommended repeat head CT in 2 weeks.   ?  ?She was seen at  [facility] ED on 3/29 for headache and CT showed 1.2 cm extra-axial collection overlying the right cerebral convexity consistent with acute on chronic subdural hematoma with local mass effect on surrounding parenchyma with no substantial midline shift. ? ?In the ED, temperature 98.9 ?F, HR 87, RR 12, BP 156/81.  WBC 2.6, hemoglobin 6.8, platelets clumped.  Sodium 138, potassium 5.5, chloride 95, CO2 32, BUN 27, creatinine 6.67, glucose 98.  Lipase 23, AST 12, ALT 10.  BNP 958.2.  High sensitive troponin 16> 16.  INR 1.3.  FOBT positive.  CT head without contrast with low attenuating right frontal subdural collection likely evolution of the previously bleed, mild associated mass effect and compression of the right frontal lobe, no midline shift, old left cerebellar infarct.  CT abdomen/pelvis with diffuse moderate GGO lung parenchyma with moderate severity areas of bibasilar atelectasis, right pelvic renal transplant, renal osteodystrophy, stable lytic lesion in T9 vertebral body.  Neurosurgery, Dr. Cyndy Freeze was consulted and recommended no surgical intervention at this time and did not feel that the hematoma is causing the patient's symptoms.  Patient was given Dilaudid, Ativan, morphine, Zofran and IV Protonix.  1 unit PRBC ordered.  Hospital service consulted for further evaluation and management of acute blood loss anemia. ?

## 2021-12-02 NOTE — ED Notes (Signed)
Pt is requesting pain medicine - pt offered the PRN pain medicine ordered (oxycodone) - pt refuses at this time and states that the oxycodone hurts her stomach and wears off too fast and pt states that she wants dilaudid and wants to speak to the MD - MD Rathore notified - no further orders at this time  ?

## 2021-12-02 NOTE — ED Notes (Signed)
Pt cussing out this RN about not giving her dilaudid - stating "why the fuck are you not helping me" and "this is bullshit" - pt again offered the PRN pain medicine ordered at this time but pt refused.  ?

## 2021-12-02 NOTE — Assessment & Plan Note (Addendum)
Patient presenting to ED with dizziness, weakness, fatigue and was found to have a hemoglobin of 6.1.  Previously hemoglobin 9.2 on 11/17/2021.  Anemia panel with iron 39, TIBC 172, ferritin 814, B12 744, folate 16.1 consistent with anemia of chronic disease.  Denies melena or hematochezia.  No current use of anticoagulants/antiplatelets.  FOBT positive.  Patient was transfused 2 unit PRBCs on 12/02/2021.  EGD 12/03/2021 with gastritis and erythematous duodenopathy.  Attempted colonoscopy but poor prep.  Gastroenterology wanted to continue the prep overnight with attempt at colonoscopy once again, but patient declines and wishes to follow-up outpatient.  Protonix 40 mg p.o. daily.  Recommend repeat CBC 1 week.  Outpatient follow-up with gastroenterology, Dr. Benson Norway for consideration of repeat colonoscopy. ?

## 2021-12-02 NOTE — H&P (View-Only) (Signed)
UNASSIGNED CONSULT ? ?Reason for Consult:IDA ?Referring Physician: [name] ? 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  She was recently hospitalized for encephalopathy that was multifactorial: uremia, subdural hematoma, polypharmacy, and an acute CVA.  At that time she was being treated for COVID and there was suspicion by neurology that she had small vessel disease versus COVID-induced hypercoagulability.  With her headache she started to take Excederin 2 tablets every 2-3 hours for the past week.  The patient's HGB on admission was at 6.8 g/dL and it dropped down to 6.1 g/dL with IV hydration.  Her discharge HGB on 11/17/2021 was at 9.2 g/dL and her baseline value over the past year varied between 8-12 g/dL.  She denies any prior EGD or colonoscopy.  With this admission she does not report any problems with melena, hematochezia, or hematemesis.  There is a complaint of right sided abdominal pain  ? ?Past Medical History:  ?Diagnosis Date  ? Anemia   ? Anxiety   ? ESRD (end stage renal disease) ([place])   ? Henmodialysis  ? Gastroparesis   ? Hypertension   ? Narcotic abuse ([place])   ? Non compliance with medical treatment   ? Renal disorder 2015  ? transplant  ? Vision loss   ? ? ?Past Surgical History:  ?Procedure Laterality Date  ? A/V FISTULAGRAM Right 02/17/2021  ? Procedure: A/V FISTULAGRAM;  Surgeon: [name], MD;  Location: [place] [facility];  Service: Cardiovascular;  Laterality: Right;  ? AV FISTULA PLACEMENT  11/17/2011  ? Procedure: ARTERIOVENOUS (AV) FISTULA CREATION;  Surgeon: Rosetta Posner, MD;  Location: [place];  Service: Vascular;   Laterality: Right;  ? EYE SURGERY    ? HEMATOMA EVACUATION Right 02/25/2021  ? Procedure: EVACUATION HEMATOMA RIGHT CHEST;  Surgeon: Waynetta Sandy, MD;  Location: [place];  Service: Vascular;  Laterality: Right;  ? INSERTION OF DIALYSIS CATHETER Right 12/08/2020  ? Procedure: INSERTION OF TUNNELED DIALYSIS CATHETER;  Surgeon: [name], MD;  Location: [facility];  Service: Vascular;  Laterality: Right;  ? IR REMOVAL TUN CV CATH W/O FL  05/03/2021  ? KIDNEY TRANSPLANT    ? NEPHRECTOMY TRANSPLANTED ORGAN    ? pancrease transplant    ? PERIPHERAL VASCULAR BALLOON ANGIOPLASTY Right 02/17/2021  ? Procedure: PERIPHERAL VASCULAR BALLOON ANGIOPLASTY;  Surgeon: [name], MD;  Location: [place] [facility];  Service: Cardiovascular;  Laterality: Right;  ? REFRACTIVE SURGERY    ? REVISON OF ARTERIOVENOUS FISTULA Right 12/08/2020  ? Procedure: RIGHT ARM ARTERIOVENOUS FISTULA REVISION AND RESECTION;  Surgeon: [name], MD;  Location: [place];  Service: Vascular;  Laterality: Right;  ? REVISON OF ARTERIOVENOUS FISTULA Right 02/25/2021  ? Procedure: RIGHT ARM ARTERIOVENOUS FISTULA REVISION WITH TRANSPOSITION OF CEPHALIC VEIN ON AXILLARY VEIN;  Surgeon: [name], MD;  Location: [place];  Service: Vascular;  Laterality: Right;  ? ? ?Family History  ?Problem Relation Age of Onset  ? Hypertension Father   ? ? ?Social History:  reports that she has quit smoking. Her smoking use included pipe. She has never used smokeless tobacco. She reports that she does not drink alcohol and  does not use drugs. ? ?Allergies:  ?Allergies  ?Allergen Reactions  ? Iodinated Contrast Media   ?  Other reaction(s): Unknown  ? ? ?Medications: Scheduled: ? sodium chloride   Intravenous Once  ? sodium chloride   Intravenous Once  ? Chlorhexidine Gluconate Cloth  6 each Topical Q0600  ? cloNIDine  0.1 mg Transdermal Weekly  ? cycloSPORINE modified  100 mg Oral BID  ? mycophenolate  540 mg Oral BID  ? pantoprazole  (PROTONIX) IV  40 mg Intravenous Q12H  ? polyethylene glycol-electrolytes  4,000 mL Oral Once  ? predniSONE  15 mg Oral Q breakfast  ? ?Continuous: ? ?Results for orders placed or performed during the hospital encounter of 12/01/21 (from the past 24 hour(s))  ?Comprehensive metabolic panel     Status: Abnormal  ? Collection Time: 12/01/21  8:21 PM  ?Result Value Ref Range  ? Sodium 138 135 - 145 mmol/L  ? Potassium 5.5 (H) 3.5 - 5.1 mmol/L  ? Chloride 95 (L) 98 - 111 mmol/L  ? CO2 32 22 - 32 mmol/L  ? Glucose, Bld 98 70 - 99 mg/dL  ? BUN 27 (H) 6 - 20 mg/dL  ? Creatinine, Ser 6.67 (H) 0.44 - 1.00 mg/dL  ? Calcium 8.8 (L) 8.9 - 10.3 mg/dL  ? Total Protein 5.7 (L) 6.5 - 8.1 g/dL  ? Albumin 2.5 (L) 3.5 - 5.0 g/dL  ? AST 12 (L) 15 - 41 U/L  ? ALT 10 0 - 44 U/L  ? Alkaline Phosphatase 87 38 - 126 U/L  ? Total Bilirubin 0.7 0.3 - 1.2 mg/dL  ? GFR, Estimated 8 (L) >60 mL/min  ? Anion gap 11 5 - 15  ?Lipase, blood     Status: None  ? Collection Time: 12/01/21  8:21 PM  ?Result Value Ref Range  ? Lipase 23 11 - 51 U/L  ?Troponin I (High Sensitivity)     Status: None  ? Collection Time: 12/01/21  8:21 PM  ?Result Value Ref Range  ? Troponin I (High Sensitivity) 16 <18 ng/L  ?CBC with Differential     Status: Abnormal  ? Collection Time: 12/01/21  8:21 PM  ?Result Value Ref Range  ? WBC 2.6 (L) 4.0 - 10.5 K/uL  ? RBC 2.17 (L) 3.87 - 5.11 MIL/uL  ? Hemoglobin 6.8 (LL) 12.0 - 15.0 g/dL  ? HCT 20.9 (L) 36.0 - 46.0 %  ? MCV 96.3 80.0 - 100.0 fL  ? MCH 31.3 26.0 - 34.0 pg  ? MCHC 32.5 30.0 - 36.0 g/dL  ? RDW 14.2 11.5 - 15.5 %  ? Platelets PLATELET CLUMPS NOTED ON SMEAR, UNABLE TO ESTIMATE 150 - 400 K/uL  ? nRBC 0.0 0.0 - 0.2 %  ? Neutrophils Relative % 56 %  ? Neutro Abs 1.4 (L) 1.7 - 7.7 K/uL  ? Lymphocytes Relative 30 %  ? Lymphs Abs 0.8 0.7 - 4.0 K/uL  ? Monocytes Relative 8 %  ? Monocytes Absolute 0.2 0.1 - 1.0 K/uL  ? Eosinophils Relative 5 %  ? Eosinophils Absolute 0.1 0.0 - 0.5 K/uL  ? Basophils Relative 1 %  ? Basophils  Absolute 0.0 0.0 - 0.1 K/uL  ? Immature Granulocytes 0 %  ? Abs Immature Granulocytes 0.01 0.00 - 0.07 K/uL  ?Brain natriuretic peptide     Status: Abnormal  ? Collection Time: 12/01/21  8:21 PM  ?Result Value Ref Range  ? B Natriuretic Peptide 958.2 (H) 0.0 - 100.0 pg/mL  ?Type and  screen [place]     Status: None (Preliminary result)  ? Collection Time: 12/01/21  9:38 PM  ?Result Value Ref Range  ? ABO/RH(D) O POS   ? Antibody Screen NEG   ? Sample Expiration    ?  12/04/2021,2359 ?Performed at [facility], [place] [address] [place], [place] [address] ?  ? Unit Number [id number]   ? Blood Component Type RED CELLS,LR   ? Unit division 00   ? Status of Unit ISSUED   ? Transfusion Status OK TO TRANSFUSE   ? Crossmatch Result Compatible   ? Unit Number [id number]   ? Blood Component Type RBC LR PHER1   ? Unit division 00   ? Status of Unit ALLOCATED   ? Transfusion Status OK TO TRANSFUSE   ? Crossmatch Result Compatible   ?Protime-INR     Status: Abnormal  ? Collection Time: 12/01/21  9:38 PM  ?Result Value Ref Range  ? Prothrombin Time 16.4 (H) 11.4 - 15.2 seconds  ? INR 1.3 (H) 0.8 - 1.2  ?Prepare RBC (crossmatch)     Status: None  ? Collection Time: 12/01/21  9:38 PM  ?Result Value Ref Range  ? Order Confirmation    ?  ORDER PROCESSED BY BLOOD BANK ?Performed at [facility], [place] [address] [place], [place] [address] ?  ?Troponin I (High Sensitivity)     Status: None  ? Collection Time: 12/01/21 10:07 PM  ?Result Value Ref Range  ? Troponin I (High Sensitivity) 16 <18 ng/L  ?hCG, quantitative, pregnancy     Status: None  ? Collection Time: 12/01/21 10:07 PM  ?Result Value Ref Range  ? hCG, Beta Chain, Quant, S 1 <5 mIU/mL  ?CBC with Differential     Status: Abnormal  ? Collection Time: 12/01/21 10:11 PM  ?Result Value Ref Range  ? WBC 2.5 (L) 4.0 - 10.5 K/uL  ? RBC 1.95 (L) 3.87 - 5.11 MIL/uL  ? Hemoglobin 6.1 (LL) 12.0 - 15.0 g/dL  ? HCT 18.8 (L) 36.0 - 46.0 %  ? MCV 96.4 80.0  - 100.0 fL  ? MCH 31.3 26.0 - 34.0 pg  ? MCHC 32.4 30.0 - 36.0 g/dL  ? RDW 14.1 11.5 - 15.5 %  ? Platelets 115 (L) 150 - 400 K/uL  ? nRBC 0.0 0.0 - 0.2 %  ? Neutrophils Relative % 52 %  ? Neutro Abs 1.3 (L) 1

## 2021-12-02 NOTE — ED Notes (Signed)
MD aware of pt BP  

## 2021-12-02 NOTE — ED Notes (Signed)
Pharmacy called to verify meds.

## 2021-12-02 NOTE — ED Notes (Signed)
Blood transfusion ended at 0415 - morning labs can be collected at 0615 ?

## 2021-12-02 NOTE — Assessment & Plan Note (Addendum)
Continue immunosuppressants with mycophenolate, cyclosporine, prednisone .   ?

## 2021-12-02 NOTE — Assessment & Plan Note (Addendum)
Etiology likely secondary to volume overload.  Currently on Catapres patch 0.1 mg weekly.  Was previously also on amlodipine 10 mg p.o. daily and losartan 100 mg p.o. daily that was discontinued during previous hospitalization.  Nephrology was consulted for volume management with HD during hospitalization.  Continue clonidine 0.1 mg patch weekly.  Restarted amlodipine 10 mg p.o. daily, losartan 100 mg p.o. daily.  Outpatient follow-up with PCP. ?

## 2021-12-02 NOTE — Assessment & Plan Note (Addendum)
Unclear etiology, discovered during recent admission.  Patient denies history of any falls or head injury.  She is presenting with complaints of severe right-sided headaches, lightheadedness, nausea, and vomiting for the past 3 days.  CT head showing low attenuating right frontal subdural collection likely evolution of the previously seen bleed.  Mild associated mass effect and compression of the right frontal lobe.  No midline shift. Neurosurgery consulted; Dr. Cyndy Freeze and recommended no surgical intervention at this time and did not feel that the hematoma is causing the patient's symptoms.  No focal neurodeficit on exam. Outpatient follow-up with neurosurgery, Dr. Zada Finders. ?

## 2021-12-02 NOTE — Progress Notes (Signed)
?PROGRESS NOTE ? ? ? ?[name] [name]  WCB:[id number] DOB: 07/28/1981 DOA: 12/01/2021 ?PCP: Patient, No Pcp Per (Inactive)  ? ? ?Brief Narrative:  ?[name] [name] is a 41 year old female with past medical history significant for ESRD on HD MWF, type 1 diabetes mellitus, renal/pancreatic transplant with subsequent renal transplant failure due to antirejection medication nonadherence, HTN, gastroparesis, gout, anxiety, anemia of chronic kidney disease, thrombocytopenia, narcotic abuse who presented to [facility] ED on 3/30 complaining of persistent severe headache, dizziness, nausea/vomiting, chest and abdominal discomfort.  Patient reports 3-day history of severe right-sided headache, weakness of the entire right side of her body, nausea/vomiting, right-sided abdominal pain, chest pain and shortness of breath.  Symptoms started after dialysis on Monday and have persisted since.  She also went to dialysis on Wednesday and reported full session.  Denies any history of falls or head injury.  Denies hematemesis, no hematochezia, no melena.  Reports morphine does not help with her headache and requesting Dilaudid.  Has not been taking aspirin for several weeks and she has run out. ? ?Recently admitted 3/4-3/16 for acute onset confusion.  She was also treated for COVID-pneumonia.  She was admitted to the ICU with acute encephalopathy and progressively worsening respiratory status.  Encephalopathy was felt to be multifactorial due to uremia, acute CVA, subdural hematoma, polypharmacy/opiate overdose, and elevated ammonia.  LP was done and without evidence of meningitis.  No seizure on EEG.  She was continued on lactulose and narcotic dose decreased.  MRI of brain done 3/8 noted small acute infarcts in posterior paramidline cerebellum bilaterally.  Neurology suspected concomitant small vessel disease versus hypercoagulability from COVID, no large vessel disease.  Anterior left frontal subdural hemorrhage noted initially on MRI  3/8, unclear etiology and felt to be possibly related to coagulopathy. Neurosurgery recommended repeat head CT in 2 weeks.   ?  ?She was seen at [facility] ED on 3/29 for headache and CT showed 1.2 cm extra-axial collection overlying the right cerebral convexity consistent with acute on chronic subdural hematoma with local mass effect on surrounding parenchyma with no substantial midline shift. ? ?In the ED, temperature 98.9 ?F, HR 87, RR 12, BP 156/81.  WBC 2.6, hemoglobin 6.8, platelets clumped.  Sodium 138, potassium 5.5, chloride 95, CO2 32, BUN 27, creatinine 6.67, glucose 98.  Lipase 23, AST 12, ALT 10.  BNP 958.2.  High sensitive troponin 16> 16.  INR 1.3.  FOBT positive.  CT head without contrast with low attenuating right frontal subdural collection likely evolution of the previously bleed, mild associated mass effect and compression of the right frontal lobe, no midline shift, old left cerebellar infarct.  CT abdomen/pelvis with diffuse moderate GGO lung parenchyma with moderate severity areas of bibasilar atelectasis, right pelvic renal transplant, renal osteodystrophy, stable lytic lesion in T9 vertebral body.  Neurosurgery, Dr. Cyndy Freeze was consulted and recommended no surgical intervention at this time and did not feel that the hematoma is causing the patient's symptoms.  Patient was given Dilaudid, Ativan, morphine, Zofran and IV Protonix.  1 unit PRBC ordered.  Hospital service consulted for further evaluation and management of acute blood loss anemia.  ? ? ?Assessment & Plan: ?  ?Assessment and Plan: ?* Subdural hematoma ?Unclear etiology, discovered during recent admission.  Patient denies history of any falls or head injury.  She is presenting with complaints of severe right-sided headaches, lightheadedness, nausea, and vomiting for the past 3 days.  CT head showing low attenuating right frontal subdural collection likely evolution  of the previously seen bleed.  Mild associated mass effect and  compression of the right frontal lobe.  No midline shift. Neurosurgery consulted; Dr. Cyndy Freeze and recommended no surgical intervention at this time and did not feel that the hematoma is causing the patient's symptoms.  No focal neurodeficit on exam.  ?--Dilaudid 0.5 mg IV q4h PRN severe pain  ?--antiemetic as needed for nausea.   ?--neurochecks  ? ?Acute blood loss anemia ?Patient presenting to ED with dizziness, weakness, fatigue and was found to have a hemoglobin of 6.1.  Previously hemoglobin 9.2 on 11/17/2021.  Denies melena or hematochezia.  No current use of anticoagulants/antiplatelets.  FOBT positive.  Concern for GI bleed. ?--Gastroenterology following; appreciate assistance ?--Transfuse 1 unit PRBC 3/31 ?--Protonix 40 mg IV Q12h ?--H/H q12h ?--Transfuse for hemoglobin less than 7.0 or active bleeding ?--Check anemia panel ?--Clear liquid diet, n.p.o. after midnight ?--EGD planned for 4/1 ? ?Chest pain ?Symptoms likely related to volume overload. ACS less likely as high-sensitivity troponin negative x2.  She is on 2 L supplemental oxygen and oxygenating well with no respiratory distress.  Unclear whether she was hypoxic or supplemental oxygen was placed in the ED for comfort. Chest x-ray showing mild to moderate severity interstitial edema with moderate severely bilateral airspace disease.  Recently treated for COVID pneumonia and do not think this is active COVID infection.  Low suspicion for bacterial pneumonia given no fever. Leukopenia seen on previous labs as well.  PE less likely given no tachycardia or hypotension. ?--Continue volume management with HD ?--Monitor on telemetry ? ?Hypertensive urgency ?Etiology likely secondary to volume overload. ?--Nephrology consulted for volume management with HD ?--Clonidine patch ?--Hydralazine IV PRN ? ?ESRD on dialysis [facility]) ?Patient dialyzes on a Monday/Wednesday/Friday schedule, last HD Wednesday, reported as full session. ?--Nephrology consulted for continued  HD while inpatient ? ?Abdominal pain ?Patient is endorsing right-sided abdominal pain.  History of pancreatic transplant and CT showing very mild peripancreatic inflammatory fat stranding without evidence of ductal dilatation.  Acute pancreatitis less likely given normal lipase and no complaints of epigastric abdominal pain.    Abdominal exam benign. ?--Supportive care ? ?History of renal transplant ?--mycophenolate 540 mg twice daily ?--cyclosporine 100 mg twice daily ?--prednisone 15 mg daily.   ? ?Type 1 diabetes ([place]) ?History of pancreatic transplant and not requiring medical treatment for diabetes.  CBGs currently well controlled. ?--CBGs q4h ? ?Thrombocytopenia ([place]) ?Chronic and likely due to end-stage renal disease.  Platelet count 115k, was 129k on 3/16. ?--Continue to monitor ? ?Abnormal finding on CT scan ?CT showing a stable lytic lesion within the T9 vertebral body on the right.  A tiny posterior pathologic fracture deformity of indeterminate age cannot be excluded.  Patient is not endorsing any back pain. ?--Continue to monitor ? ? ? ?DVT prophylaxis: SCDs Start: 12/02/21 0322 ? ?  Code Status: Full Code ?Family Communication: No family present at bedside this morning ? ?Disposition Plan:  ?Level of care: Progressive ?Status is: Observation ?The patient remains OBS appropriate and will d/c before 2 midnights. ?  ? ?Consultants:  ?Nephrology ?Gastroenterology ? ?Procedures:  ?Pending EGD 4/1 ? ?Antimicrobials:  ?None ? ? ?Subjective: ?Patient seen examined bedside, resting comfortably.  RN present.  Continues to complain of diffuse pain, worse with headache.  No further nausea/vomiting.  Also complaining about swelling to abdomen and lower extremities.  Asking about when she will get dialysis today.  Discussed with patient that her subdural hematoma appears stable, was reviewed by neurosurgery earlier with  no recommendations of surgical intervention at this time.  Also asking about her dosing of pain  medication.  In regards to her low hemoglobin, patient denies any blood in her stool, but does report that she had questionable episode of blood in her vomitus yesterday.  No other specific questions or co

## 2021-12-02 NOTE — Assessment & Plan Note (Addendum)
Symptoms likely related to volume overload. ACS less likely as high-sensitivity troponin negative x2.  She is on 2 L supplemental oxygen and oxygenating well with no respiratory distress.  Unclear whether she was hypoxic or supplemental oxygen was placed in the ED for comfort. Chest x-ray showing mild to moderate severity interstitial edema with moderate severely bilateral airspace disease.  Recently treated for COVID pneumonia and do not think this is active COVID infection.  Low suspicion for bacterial pneumonia given no fever. Leukopenia seen on previous labs as well.  PE less likely given no tachycardia or hypotension.  Patient received hemodialysis and oxygen was titrated off during hospitalization. ?

## 2021-12-02 NOTE — Assessment & Plan Note (Addendum)
Patient is endorsing right-sided abdominal pain.  History of pancreatic transplant and CT showing very mild peripancreatic inflammatory fat stranding without evidence of ductal dilatation.  Acute pancreatitis less likely given normal lipase and no complaints of epigastric abdominal pain. Abdominal exam benign.  Resolved at time of discharge. ?

## 2021-12-02 NOTE — ED Notes (Signed)
Pt once again refusing pain medicine ordered at this time ?

## 2021-12-02 NOTE — H&P (Deleted)
[name] [name] is a 41 Y/O ESRD patient on HD MWF at [facility]. PMH: Pancreatic/renal transplant with failure of renal transplant D/T nonadherence to immunosuppressive medications, HTN, gout, anxiety, anemia of ESRD, SHPT, narcotic abuse, subdural hematoma of unknown origin. Last HD 11/30/2021 left 4 kgs above OP EDW. Recent COVID PNA. She has been admitted with severe headaches, dizziness, N & V as observation patient. Also noted CXR which shows Mild to moderate severity interstitial edema with moderate severity bilateral airspace disease. She has rec'd 1 unit of blood without HD. We will manage HD today and consult formally if patient status upgraded to in-patient.  ? ? ?Juanell Fairly NPC ?[place] ?[phone] ?

## 2021-12-02 NOTE — Assessment & Plan Note (Addendum)
CT showing a stable lytic lesion within the T9 vertebral body on the right.  A tiny posterior pathologic fracture deformity of indeterminate age cannot be excluded.  Patient is not endorsing any back pain. ?

## 2021-12-02 NOTE — Progress Notes (Signed)
Patient requested interpretation with Arabic interpreter.  Assisted with interpretation by Fanny Skates #[id number].  All patient's questions and concerns were answered.  Discussed with patient concern for GI bleed given low hemoglobin level with planned EGD tomorrow.  Additionally we discussed with patient that subdural hematoma was stable and no surgical intervention was recommended by neurosurgery, Dr. Cyndy Freeze at this time.  Patient asking for a regular diet, discussed with her that she is on a clear liquid diet in anticipation of endoscopy tomorrow.  No other questions or concerns at this time. ? ? ?

## 2021-12-02 NOTE — H&P (Signed)
?History and Physical  ? ? ?[name] [name] CWC:[id number] DOB: 1980/10/29 DOA: 12/01/2021 ? ?PCP: Patient, No Pcp Per (Inactive) ? ?Patient coming from: Home ? ?Chief Complaint: Headache ? ?HPI: [name] [name] is a 41 y.o. 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  Recently admitted 3/4-3/16 for acute onset confusion.  She was also treated for COVID-pneumonia.  She was admitted to the ICU with acute encephalopathy and progressively worsening respiratory status.  Encephalopathy was felt to be multifactorial due to uremia, acute CVA, subdural hematoma, polypharmacy/opiate overdose, and elevated ammonia.  LP was done and without evidence of meningitis.  No seizure on EEG.  She was continued on lactulose and narcotic dose decreased.  MRI of brain done 3/8 noted small acute infarcts in posterior paramidline cerebellum bilaterally.  Neurology suspected concomitant small vessel disease versus hypercoagulability from COVID, no large vessel disease.  Anterior left frontal subdural hemorrhage noted initially on MRI 3/8, unclear etiology and felt to be possibly related to coagulopathy.  Neurosurgery recommended repeat head CT in 2 weeks.   ? ?She was seen at [facility] ED on 3/29 for headache and CT showed 1.2 cm extra-axial collection overlying the right cerebral convexity consistent with acute on chronic subdural hematoma with local mass effect on surrounding parenchyma with no substantial midline shift. ? ?Patient presented to the ED yesterday complaining of ongoing severe headache, dizziness, nausea, and vomiting.  Also complained of chest and abdominal discomfort.  Hypertensive in the ED.  Placed on 2 L supplemental oxygen.  FOBT positive, no melena or hematochezia.   Labs showing WBC 2.5.  Hemoglobin 6.1, was 9.2 on 3/16.  Platelet count 115k, was 129k on 3/16.  Potassium 5.5.  Bicarb 32.  High-sensitivity troponin negative x2.  BNP 958.  INR 1.3.   ? ?Chest x-ray showing mild to moderate severity interstitial edema with moderate severity bilateral airspace disease.   ? ?CT abdomen pelvis showing diffuse moderate severity groundglass appearance of the lung parenchyma with moderate severity areas of bibasilar atelectasis consistent with an underlying infectious or inflammatory process.  Tiny bilateral pleural effusions.  Cholelithiasis.  Findings suspicious for mild acute pancreatitis.  Right pelvic renal transplant.  Findings consistent with renal osteodystrophy.  Stable lytic lesion within the T9 vertebral body on the right.  A tiny posterior pathologic fracture deformity of indeterminate age cannot be excluded.  Aortic atherosclerosis. ? ?Lipase normal. ? ?CT head showing low attenuating right frontal subdural collection likely evolution of the previously seen bleed.  Mild associated mass effect and compression of the right frontal lobe.  No midline shift.  Also showing old left cerebellar infarct. ? ?Neurosurgery consulted and recommended no surgical intervention at this time and did not feel that the hematoma is causing the patient's symptoms.  Patient was given Dilaudid, Ativan, morphine, Zofran, and IV Protonix 40 mg.  1 unit PRBCs ordered. ? ?History per patient: She reports 3-day history of severe right-sided headache, weakness of the entire right side of her body, nausea, vomiting, right-sided abdominal pain, chest pain, and shortness of breath.  Symptoms started after dialysis on Monday and have persisted since then.  She also went for dialysis on Wednesday.  Patient denies any history of falls or head injury.  Denies hematemesis, hematochezia, or melena.  States morphine does not help with her headache and she  is requesting Dilaudid.  She no longer takes  aspirin. ? ?Review of Systems:  ?Review of Systems  ?All other systems reviewed and are negative. ? ?Past Medical History:  ?Diagnosis Date  ? Anemia   ? Anxiety   ? ESRD (end stage renal disease) ([place])   ? Henmodialysis  ? Gastroparesis   ? Hypertension   ? Narcotic abuse ([place])   ? Non compliance with medical treatment   ? Renal disorder 2015  ? transplant  ? Vision loss   ? ? ?Past Surgical History:  ?Procedure Laterality Date  ? A/V FISTULAGRAM Right 02/17/2021  ? Procedure: A/V FISTULAGRAM;  Surgeon: [name], MD;  Location: [place] [facility];  Service: Cardiovascular;  Laterality: Right;  ? AV FISTULA PLACEMENT  11/17/2011  ? Procedure: ARTERIOVENOUS (AV) FISTULA CREATION;  Surgeon: Rosetta Posner, MD;  Location: [place];  Service: Vascular;  Laterality: Right;  ? EYE SURGERY    ? HEMATOMA EVACUATION Right 02/25/2021  ? Procedure: EVACUATION HEMATOMA RIGHT CHEST;  Surgeon: Waynetta Sandy, MD;  Location: [place];  Service: Vascular;  Laterality: Right;  ? INSERTION OF DIALYSIS CATHETER Right 12/08/2020  ? Procedure: INSERTION OF TUNNELED DIALYSIS CATHETER;  Surgeon: [name], MD;  Location: [facility] OR;  Service: Vascular;  Laterality: Right;  ? IR REMOVAL TUN CV CATH W/O FL  05/03/2021  ? KIDNEY TRANSPLANT    ? NEPHRECTOMY TRANSPLANTED ORGAN    ? pancrease transplant    ? PERIPHERAL VASCULAR BALLOON ANGIOPLASTY Right 02/17/2021  ? Procedure: PERIPHERAL VASCULAR BALLOON ANGIOPLASTY;  Surgeon: [name], MD;  Location: [place] [facility];  Service: Cardiovascular;  Laterality: Right;  ? REFRACTIVE SURGERY    ? REVISON OF ARTERIOVENOUS FISTULA Right 12/08/2020  ? Procedure: RIGHT ARM ARTERIOVENOUS FISTULA REVISION AND RESECTION;  Surgeon: [name], MD;  Location: [place];  Service: Vascular;  Laterality: Right;  ? REVISON OF ARTERIOVENOUS FISTULA Right 02/25/2021  ? Procedure: RIGHT ARM ARTERIOVENOUS FISTULA REVISION WITH TRANSPOSITION OF CEPHALIC VEIN ON AXILLARY VEIN;  Surgeon:  [name], MD;  Location: [place];  Service: Vascular;  Laterality: Right;  ? ? ? reports that she has quit smoking. Her smoking use included pipe. She has never used smokeless tobacco. She reports that she does not drink alcohol and does not use drugs. ? ?Allergies  ?Allergen Reactions  ? Iodinated Contrast Media   ?  Other reaction(s): Unknown  ? ? ?Family History  ?Problem Relation Age of Onset  ? Hypertension Father   ? ? ?Prior to Admission medications   ?Medication Sig Start Date End Date Taking? Authorizing Provider  ?aspirin EC 81 MG tablet Take 81 mg by mouth daily.   Yes [provider]  ?aspirin-acetaminophen-caffeine (EXCEDRIN MIGRAINE) [phone] MG tablet Take 1 tablet by mouth every 6 (six) hours as needed for headache.   Yes [provider]  ?CATAPRES-TTS-1 0.1 MG/24HR patch Place 0.1 mg onto the skin once a week. 09/26/21  Yes [provider]  ?cycloSPORINE modified (NEORAL) 100 MG capsule Take 2 capsules (200 mg total) by mouth daily. ?Patient taking differently: Take 200 mg by mouth 2 (two) times daily. 11/17/21  Yes Cherene Altes, MD  ?cycloSPORINE modified (NEORAL) 25 MG capsule Take 25 mg by mouth daily after lunch.   Yes [provider]  ?Darbepoetin Alfa (ARANESP) 60 MCG/0.3ML SOSY injection Inject 0.3 mLs (60 mcg total) into the vein every Friday with hemodialysis. 11/18/21  Yes Cherene Altes,  MD  ?diphenhydrAMINE (BENADRYL) 25 MG tablet Take 25 mg by mouth every 6 (six) hours as needed for itching.   Yes [provider]  ?doxercalciferol (HECTOROL) 4 MCG/2ML injection Inject 3 mLs (6 mcg total) into the vein every Monday, Wednesday, and Friday with hemodialysis. 11/17/21  Yes Cherene Altes, MD  ?fluticasone (FLONASE) 50 MCG/ACT nasal spray Place 1 spray into both nostrils daily as needed for allergies. 11/24/21  Yes [provider]  ?heparin 1000 unit/mL SOLN injection Heparin Sodium (Porcine) 1,000 Units/mL Catheter  Lock Arterial 12/22/20 12/21/21 Yes [provider]  ?lidocaine-prilocaine (EMLA) cream Apply 1 application topically daily as needed (port access). 07/09/20  Yes [provider]  ?metoCLOPramide (REGL

## 2021-12-02 NOTE — Assessment & Plan Note (Addendum)
Chronic and likely due to end-stage renal disease.  Platelet count 205 at time of discharge. ?

## 2021-12-02 NOTE — ED Provider Notes (Signed)
?  Provider Note ?MRN:  [id number]  ?Arrival date & time: 12/02/21    ?ED Course and Medical Decision Making  ?Assumed care from PA Autry at shift change. ? ?History of ESRD here with likely GI bleeding, hemoglobin down to 6.1.  Transfusing blood.  Also with enlarging subdural hematoma.  Nothing to do per neurosurgery.  Will need admission. ? ?.Critical Care ?Performed by: Maudie Flakes, MD ?Authorized by: Maudie Flakes, MD  ? ?Critical care provider statement:  ?  Critical care time (minutes):  35 ?  Critical care was necessary to treat or prevent imminent or life-threatening deterioration of the following conditions: Symptomatic anemia requiring blood transfusion. ?  Critical care was time spent personally by me on the following activities:  Development of treatment plan with patient or surrogate, discussions with consultants, evaluation of patient's response to treatment, examination of patient, ordering and review of laboratory studies, ordering and review of radiographic studies, ordering and performing treatments and interventions, pulse oximetry, re-evaluation of patient's condition and review of old charts ? ?Final Clinical Impressions(s) / ED Diagnoses  ? ?  ICD-10-CM   ?1. Generalized abdominal pain  R10.84   ?  ?2. Symptomatic anemia  D64.9   ?  ?  ?ED Discharge Orders   ? ? None  ? ?  ?  ?Discharge Instructions   ?None ?  ? ? ?[name] Kirks. Sedonia Small, MD ?[facility] Emergency Medicine ?[place] [email or web address] ? ?  ?Maudie Flakes, MD ?12/02/21 0145 ? ?

## 2021-12-02 NOTE — Assessment & Plan Note (Addendum)
Patient dialyzes on a Monday/Wednesday/Friday schedule.  Nephrology was consulted during hospitalization for continued hemodialysis.  Follows with nephrology outpatient, Dr. Moshe Cipro. ?

## 2021-12-02 NOTE — ED Notes (Signed)
This RN spoke to MD Rathore on the phone and notified her of pt behaviors - orders to be placed  ?

## 2021-12-02 NOTE — Progress Notes (Addendum)
Ivey Vanderzee is a 41 Y/O ESRD patient on HD MWF at [facility]. PMH: Pancreatic/renal transplant with failure of renal transplant D/T nonadherence to immunosuppressive medications, HTN, gout, anxiety, anemia of ESRD, SHPT, narcotic abuse, subdural hematoma of unknown origin. Last HD 11/30/2021 left 4 kgs above OP EDW. Recent COVID PNA. She has been admitted with severe headaches, dizziness, N & V as observation patient. Also noted CXR which shows Mild to moderate severity interstitial edema with moderate severity bilateral airspace disease. She has rec'd 1 unit of blood without HD for HGB 6.1. We will manage HD today and consult formally if patient status upgraded to in-patient.  ? ? ?Juanell Fairly NPC ?[place] ?[phone] ?

## 2021-12-02 NOTE — Assessment & Plan Note (Addendum)
History of pancreatic transplant and not requiring medical treatment for diabetes.  CBGs currently well controlled. ?

## 2021-12-03 ENCOUNTER — Encounter (HOSPITAL_COMMUNITY)
Admission: EM | Disposition: A | Discharge status: Home / Self Care | Payer: Self-pay | Source: Home / Self Care | Attending: Internal Medicine

## 2021-12-03 ENCOUNTER — Encounter (HOSPITAL_COMMUNITY): Payer: Self-pay | Admitting: Internal Medicine

## 2021-12-03 ENCOUNTER — Inpatient Hospital Stay (HOSPITAL_COMMUNITY): Payer: Medicare Other | Admitting: Certified Registered"

## 2021-12-03 DIAGNOSIS — K2971 Gastritis, unspecified, with bleeding: Secondary | ICD-10-CM

## 2021-12-03 DIAGNOSIS — K3189 Other diseases of stomach and duodenum: Secondary | ICD-10-CM

## 2021-12-03 DIAGNOSIS — F419 Anxiety disorder, unspecified: Secondary | ICD-10-CM

## 2021-12-03 DIAGNOSIS — K2970 Gastritis, unspecified, without bleeding: Secondary | ICD-10-CM

## 2021-12-03 DIAGNOSIS — D500 Iron deficiency anemia secondary to blood loss (chronic): Secondary | ICD-10-CM

## 2021-12-03 HISTORY — PX: ESOPHAGOGASTRODUODENOSCOPY (EGD) WITH PROPOFOL: SHX5813

## 2021-12-03 HISTORY — PX: BIOPSY: SHX5522

## 2021-12-03 LAB — CBC
HCT: 25.5 % — ABNORMAL LOW (ref 36.0–46.0)
Hemoglobin: 8.8 g/dL — ABNORMAL LOW (ref 12.0–15.0)
MCH: 31.2 pg (ref 26.0–34.0)
MCHC: 34.5 g/dL (ref 30.0–36.0)
MCV: 90.4 fL (ref 80.0–100.0)
Platelets: 140 10*3/uL — ABNORMAL LOW (ref 150–400)
RBC: 2.82 MIL/uL — ABNORMAL LOW (ref 3.87–5.11)
RDW: 15 % (ref 11.5–15.5)
WBC: 2.8 10*3/uL — ABNORMAL LOW (ref 4.0–10.5)
nRBC: 0 % (ref 0.0–0.2)

## 2021-12-03 LAB — TYPE AND SCREEN
ABO/RH(D): O POS
Antibody Screen: NEGATIVE
Unit division: 0
Unit division: 0

## 2021-12-03 LAB — GLUCOSE, CAPILLARY
Glucose-Capillary: 87 mg/dL (ref 70–99)
Glucose-Capillary: 94 mg/dL (ref 70–99)
Glucose-Capillary: 119 mg/dL — ABNORMAL HIGH (ref 70–99)
Glucose-Capillary: 118 mg/dL — ABNORMAL HIGH (ref 70–99)
Glucose-Capillary: 111 mg/dL — ABNORMAL HIGH (ref 70–99)

## 2021-12-03 LAB — RENAL FUNCTION PANEL
Albumin: 2.4 g/dL — ABNORMAL LOW (ref 3.5–5.0)
Anion gap: 11 (ref 5–15)
BUN: 11 mg/dL (ref 6–20)
CO2: 29 mmol/L (ref 22–32)
Calcium: 8.2 mg/dL — ABNORMAL LOW (ref 8.9–10.3)
Chloride: 96 mmol/L — ABNORMAL LOW (ref 98–111)
Creatinine, Ser: 3.95 mg/dL — ABNORMAL HIGH (ref 0.44–1.00)
GFR, Estimated: 14 mL/min — ABNORMAL LOW (ref >60)
Glucose, Bld: 80 mg/dL (ref 70–99)
Phosphorus: 4.6 mg/dL (ref 2.5–4.6)
Potassium: 4 mmol/L (ref 3.5–5.1)
Sodium: 136 mmol/L (ref 135–145)

## 2021-12-03 LAB — BPAM RBC
Blood Product Expiration Date: 202304272359
Blood Product Expiration Date: 202304272359
ISSUE DATE / TIME: 202303310100
ISSUE DATE / TIME: 202303311513
Unit Type and Rh: 5100
Unit Type and Rh: 5100

## 2021-12-03 SURGERY — ESOPHAGOGASTRODUODENOSCOPY (EGD) WITH PROPOFOL
Anesthesia: Monitor Anesthesia Care

## 2021-12-03 MED ORDER — SODIUM CHLORIDE 0.9 % IV SOLN: 100.0000 mL | INTRAVENOUS | Status: DC | PRN

## 2021-12-03 MED ORDER — PROCHLORPERAZINE EDISYLATE 10 MG/2ML IJ SOLN
5.0000 mg | Freq: Four times a day (QID) | INTRAMUSCULAR | Status: DC | PRN
  Administered 2021-12-03 – 2021-12-04 (×2): 5 mg via INTRAVENOUS
  Filled 2021-12-03 (×2): qty 2

## 2021-12-03 MED ORDER — PROPOFOL 500 MG/50ML IV EMUL
INTRAVENOUS | Status: DC | PRN
  Administered 2021-12-03: 50 ug/kg/min via INTRAVENOUS

## 2021-12-03 MED ORDER — LIDOCAINE-PRILOCAINE 2.5-2.5 % EX CREA: 1.0000 "application " | TOPICAL_CREAM | CUTANEOUS | Status: DC | PRN

## 2021-12-03 MED ORDER — PANTOPRAZOLE SODIUM 40 MG PO TBEC
40.0000 mg | DELAYED_RELEASE_TABLET | Freq: Every day | ORAL | Status: DC
  Administered 2021-12-03 – 2021-12-04 (×2): 40 mg via ORAL
  Filled 2021-12-03 (×2): qty 1

## 2021-12-03 MED ORDER — SODIUM CHLORIDE 0.9 % IV SOLN: INTRAVENOUS | Status: DC

## 2021-12-03 MED ORDER — AMLODIPINE BESYLATE 10 MG PO TABS
10.0000 mg | ORAL_TABLET | Freq: Every day | ORAL | Status: DC
  Administered 2021-12-03 – 2021-12-04 (×2): 10 mg via ORAL
  Filled 2021-12-03 (×2): qty 1

## 2021-12-03 MED ORDER — PENTAFLUOROPROP-TETRAFLUOROETH EX AERO: 1.0000 "application " | INHALATION_SPRAY | CUTANEOUS | Status: DC | PRN

## 2021-12-03 MED ORDER — BISACODYL 5 MG PO TBEC
20.0000 mg | DELAYED_RELEASE_TABLET | Freq: Once | ORAL | Status: AC
  Administered 2021-12-04: 20 mg via ORAL
  Filled 2021-12-03: qty 4

## 2021-12-03 MED ORDER — PEG-KCL-NACL-NASULF-NA ASC-C 100 G PO SOLR
0.5000 | Freq: Once | ORAL | Status: DC
  Filled 2021-12-03: qty 1

## 2021-12-03 MED ORDER — PEG-KCL-NACL-NASULF-NA ASC-C 100 G PO SOLR
1.0000 | Freq: Once | ORAL | Status: DC
  Filled 2021-12-03: qty 1

## 2021-12-03 MED ORDER — METOCLOPRAMIDE HCL 5 MG/ML IJ SOLN
5.0000 mg | Freq: Four times a day (QID) | INTRAMUSCULAR | Status: AC
  Administered 2021-12-03 (×2): 5 mg via INTRAVENOUS
  Filled 2021-12-03 (×3): qty 2

## 2021-12-03 MED ORDER — ONDANSETRON HCL 4 MG/2ML IJ SOLN
4.0000 mg | Freq: Four times a day (QID) | INTRAMUSCULAR | Status: DC
  Administered 2021-12-03 – 2021-12-04 (×4): 4 mg via INTRAVENOUS
  Filled 2021-12-03 (×4): qty 2

## 2021-12-03 MED ORDER — LIDOCAINE HCL (PF) 1 % IJ SOLN: 5.0000 mL | INTRAMUSCULAR | Status: DC | PRN

## 2021-12-03 MED ORDER — HYDROMORPHONE HCL 1 MG/ML IJ SOLN: 0.2500 mg | Freq: Once | INTRAMUSCULAR | Status: DC

## 2021-12-03 MED ORDER — PROPOFOL 10 MG/ML IV BOLUS
INTRAVENOUS | Status: DC | PRN
Start: 2021-12-03 — End: 2021-12-03
  Administered 2021-12-03 (×3): 20 mg via INTRAVENOUS

## 2021-12-03 SURGICAL SUPPLY — 25 items

## 2021-12-03 NOTE — Anesthesia Preprocedure Evaluation (Signed)
Anesthesia Evaluation  ? ? ?Reviewed: ?Allergy & Precautions, Patient's Chart, lab work & pertinent test results ? ?Airway ?Mallampati: III ? ? ? ? ? ? Dental ? ?(+) Edentulous Upper, Missing ?  ?Pulmonary ?former smoker,  ?  ?Pulmonary exam normal ? ? ? ? ? ? ? Cardiovascular ?hypertension, Pt. on medications ?Normal cardiovascular exam ? ? ?  ?Neuro/Psych ?Anxiety  Neuromuscular disease   ? GI/Hepatic ?negative GI ROS, (+)  ?  ? substance abuse ? ,   ?Endo/Other  ?diabetes ? Renal/GU ?ESRF and DialysisRenal disease  ? ?  ?Musculoskeletal ? ?(+) narcotic dependent ? Abdominal ?  ?Peds ? Hematology ? ?(+) Blood dyscrasia, anemia ,   ?Anesthesia Other Findings ?IDA and heme positive stool ? Reproductive/Obstetrics ? ?  ? ? ? ? ? ? ? ? ? ? ? ? ? ?  ?  ? ? ? ? ? ? ? ? ?Anesthesia Physical ?Anesthesia Plan ? ?ASA: 3 ? ?Anesthesia Plan: MAC  ? ?Post-op Pain Management:   ? ?Induction: Intravenous ? ?PONV Risk Score and Plan: 2 and Propofol infusion and Treatment may vary due to age or medical condition ? ?Airway Management Planned: Nasal Cannula ? ?Additional Equipment:  ? ?Intra-op Plan:  ? ?Post-operative Plan:  ? ?Informed Consent: I have reviewed the patients History and Physical, chart, labs and discussed the procedure including the risks, benefits and alternatives for the proposed anesthesia with the patient or authorized representative who has indicated his/her understanding and acceptance.  ? ? ? ?Dental advisory given ? ?Plan Discussed with: CRNA ? ?Anesthesia Plan Comments:   ? ? ? ? ? ? ?Anesthesia Quick Evaluation ? ?

## 2021-12-03 NOTE — Transfer of Care (Signed)
Immediate Anesthesia Transfer of Care Note ? ?Patient: [name] [name] ? ?Procedure(s) Performed: ESOPHAGOGASTRODUODENOSCOPY (EGD) WITH PROPOFOL ?BIOPSY ? ?Patient Location: PACU ? ?Anesthesia Type:MAC ? ?Level of Consciousness: awake, alert , oriented and patient cooperative ? ?Airway & Oxygen Therapy: Patient Spontanous Breathing and Patient connected to nasal cannula oxygen ? ?Post-op Assessment: Report given to RN and Post -op Vital signs reviewed and stable ? ?Post vital signs: Reviewed and stable ? ?Last Vitals:  ?Vitals Value Taken Time  ?BP 182/79 12/03/21 0919  ?Temp    ?Pulse 92 12/03/21 0919  ?Resp 18 12/03/21 0919  ?SpO2 94 % 12/03/21 0919  ?Vitals shown include unvalidated device data. ? ?Last Pain:  ?Vitals:  ? 12/03/21 0835  ?TempSrc: Tympanic  ?PainSc: 0-No pain  ?   ? ?  ? ?Complications: No notable events documented. ?

## 2021-12-03 NOTE — Progress Notes (Signed)
Patient had endoscopy today with Dr. Loletha Carrow did not find source of bleeding. ?Scheduled for colonoscopy tomorrow. ?Only able to complete half of the prep. ?Had bowel movement this morning and approximately 11:00 brown, formed slightly hard stool. ?Patient concerned about end-stage renal disease and dialysis status was so much liquid.  Also concerned that she is unable to drink that amount, has some nausea with it and feels very full. ?We will try to finish last part of GoLytely, will start sipping now with ice. ?We will give Reglan 5 mg, Zofran 4 mg scheduled every 6.  Compazine as needed for nausea. ?We will give Dulcolax 20 mg per Dr. Loletha Carrow. ?We will start half of movie prep dose around 2 to 3 AM, needs to be n.p.o. by 8 AM. ?Notify MD if stools are still semisolid or stools are not clear/yellow liquid. ?Notify MD if patient is unable to complete prep. ? ?

## 2021-12-03 NOTE — Plan of Care (Signed)

## 2021-12-03 NOTE — Consult Note (Signed)
[facility] ?Renal Consultation Note  ?  ?Indication for Consultation:  Management of ESRD/hemodialysis, anemia, hypertension/volume, and secondary hyperparathyroidism. ? ?HPI: [name] [name] is a 41 y.o. female on HD MWF at [facility]. PMH: Pancreatic/renal transplant with failure of renal transplant D/T nonadherence to immunosuppressive medications, HTN, gout, anxiety, anemia of ESRD, SHPT, narcotic abuse, subdural hematoma of unknown origin. Last HD 11/30/2021 left 4 kgs above OP EDW. Recent COVID PNA. She has been admitted with severe headaches, dizziness, N & V. Also noted CXR which shows Mild to moderate severity interstitial edema with moderate severity bilateral airspace disease. She has rec'd 1 unit of blood without HD for HGB 6.1. Underwent EGD this AM. ? ?Patient had dialysis yesterday with net UF 3L. Reports her headache was much worse during and after dialysis, requesting extra pain meds next HD. Denies dizziness, SOB, CP, palpitations, abdominal pain and nausea. Reports she did feel dizzy earlier when she was not using oxygen.  ? ?Past Medical History:  ?Diagnosis Date  ? Anemia   ? Anxiety   ? ESRD (end stage renal disease) ([place])   ? Henmodialysis  ? Gastroparesis   ? Hypertension   ? Narcotic abuse ([place])   ? Non compliance with medical treatment   ? Renal disorder 2015  ? transplant  ? Vision loss   ? ?Past Surgical History:  ?Procedure Laterality Date  ? A/V FISTULAGRAM Right 02/17/2021  ? Procedure: A/V FISTULAGRAM;  Surgeon: [name], MD;  Location: [place] [facility];  Service: Cardiovascular;  Laterality: Right;  ? AV FISTULA PLACEMENT  11/17/2011  ? Procedure: ARTERIOVENOUS (AV) FISTULA CREATION;  Surgeon: Rosetta Posner, MD;  Location: [place];  Service: Vascular;  Laterality: Right;  ? EYE SURGERY    ? HEMATOMA EVACUATION Right 02/25/2021  ? Procedure: EVACUATION HEMATOMA RIGHT CHEST;  Surgeon: Waynetta Sandy, MD;  Location: [place];   Service: Vascular;  Laterality: Right;  ? INSERTION OF DIALYSIS CATHETER Right 12/08/2020  ? Procedure: INSERTION OF TUNNELED DIALYSIS CATHETER;  Surgeon: [name], MD;  Location: [facility];  Service: Vascular;  Laterality: Right;  ? IR REMOVAL TUN CV CATH W/O FL  05/03/2021  ? KIDNEY TRANSPLANT    ? NEPHRECTOMY TRANSPLANTED ORGAN    ? pancrease transplant    ? PERIPHERAL VASCULAR BALLOON ANGIOPLASTY Right 02/17/2021  ? Procedure: PERIPHERAL VASCULAR BALLOON ANGIOPLASTY;  Surgeon: [name], MD;  Location: [place] [facility];  Service: Cardiovascular;  Laterality: Right;  ? REFRACTIVE SURGERY    ? REVISON OF ARTERIOVENOUS FISTULA Right 12/08/2020  ? Procedure: RIGHT ARM ARTERIOVENOUS FISTULA REVISION AND RESECTION;  Surgeon: [name], MD;  Location: [place];  Service: Vascular;  Laterality: Right;  ? REVISON OF ARTERIOVENOUS FISTULA Right 02/25/2021  ? Procedure: RIGHT ARM ARTERIOVENOUS FISTULA REVISION WITH TRANSPOSITION OF CEPHALIC VEIN ON AXILLARY VEIN;  Surgeon: [name], MD;  Location: [place];  Service: Vascular;  Laterality: Right;  ? ?Family History  ?Problem Relation Age of Onset  ? Hypertension Father   ? ?Social History: ? reports that she has quit smoking. Her smoking use included pipe. She has never used smokeless tobacco. She reports that she does not drink alcohol and does not use drugs. ? ?ROS: As per HPI otherwise negative. ? ?Physical Exam: ?Vitals:  ? 12/03/21 0835 12/03/21 0920 12/03/21 0935 12/03/21 1001  ?BP: (!) 212/77 (!) 182/79 (!) 184/83 (!) 198/85  ?Pulse: 93 90 89 91  ?Resp: 12  $'18 16 17  'U$ ?Temp: 98.2 ?F (36.8 ?C) 98.8 ?F (37.1 ?C) 98.8 ?F (37.1 ?C) 98.1 ?F (36.7 ?C)  ?TempSrc: Tympanic   Oral  ?SpO2: 100% 96% 99% 100%  ?Weight: 57.5 kg     ?Height: '5\' 4"'$  (1.626 m)     ?   ?General: Chronically ill appearing female, alert and in NAD ?Head: Normocephalic, atraumatic, sclera non-icteric, mucus membranes are moist. ?Neck: VD not elevated. ?Lungs: Clear  bilaterally to auscultation without wheezes, rales, or rhonchi. Breathing is unlabored on O2 2L ?Heart: RRR with normal S1, S2. No murmurs, rubs, or gallops appreciated. ?Abdomen: Soft, non-distended with normoactive bowel sounds.  ?Musculoskeletal:  Strength and tone appear normal for age. ?Lower extremities: No edema or ischemic changes, no open wounds. ?Neuro: Alert and oriented X 3. Moves all extremities spontaneously. ?Psych:  Responds to questions appropriately with a normal affect. ?Dialysis Access: AVF + t/b ? ?Allergies  ?Allergen Reactions  ? Iodinated Contrast Media   ?  Other reaction(s): Unknown  ? ?Prior to Admission medications   ?Medication Sig Start Date End Date Taking? Authorizing Provider  ?aspirin EC 81 MG tablet Take 81 mg by mouth daily.   Yes [provider]  ?aspirin-acetaminophen-caffeine (EXCEDRIN MIGRAINE) [phone] MG tablet Take 1 tablet by mouth every 6 (six) hours as needed for headache.   Yes [provider]  ?CATAPRES-TTS-1 0.1 MG/24HR patch Place 0.1 mg onto the skin once a week. 09/26/21  Yes [provider]  ?cycloSPORINE modified (NEORAL) 100 MG capsule Take 2 capsules (200 mg total) by mouth daily. ?Patient taking differently: Take 200 mg by mouth 2 (two) times daily. 11/17/21  Yes Cherene Altes, MD  ?cycloSPORINE modified (NEORAL) 25 MG capsule Take 25 mg by mouth daily after lunch.   Yes [provider]  ?Darbepoetin Alfa (ARANESP) 60 MCG/0.3ML SOSY injection Inject 0.3 mLs (60 mcg total) into the vein every Friday with hemodialysis. 11/18/21  Yes Cherene Altes, MD  ?diphenhydrAMINE (BENADRYL) 25 MG tablet Take 25 mg by mouth every 6 (six) hours as needed for itching.   Yes [provider]  ?doxercalciferol (HECTOROL) 4 MCG/2ML injection Inject 3 mLs (6 mcg total) into the vein every Monday, Wednesday, and Friday with hemodialysis. 11/17/21  Yes Cherene Altes, MD  ?fluticasone (FLONASE) 50 MCG/ACT nasal spray Place 1  spray into both nostrils daily as needed for allergies. 11/24/21  Yes [provider]  ?heparin 1000 unit/mL SOLN injection Heparin Sodium (Porcine) 1,000 Units/mL Catheter Lock Arterial 12/22/20 12/21/21 Yes [provider]  ?lidocaine-prilocaine (EMLA) cream Apply 1 application topically daily as needed (port access). 07/09/20  Yes [provider]  ?metoCLOPramide (REGLAN) 5 MG tablet Take 5 mg by mouth every 6 (six) hours as needed for nausea or vomiting. 09/03/21  Yes [provider]  ?mycophenolate (MYFORTIC) 180 MG EC tablet Take 540 mg by mouth 2 (two) times daily.   Yes [provider]  ?naloxone (NARCAN) nasal spray 4 mg/0.1 mL Place 1 spray into the nose as needed (opioid overdose).   Yes [provider]  ?oxyCODONE-acetaminophen (PERCOCET/ROXICET) 5-325 MG tablet Take 1-2 tablets by mouth every 6 (six) hours as needed for severe pain. ?Patient taking differently: Take 1 tablet by mouth 3 (three) times daily as needed for moderate pain. 03/22/21  Yes [name], MD  ?predniSONE (DELTASONE) 5 MG tablet Take 5 mg by mouth in the morning, at noon, and at bedtime. 06/24/15  Yes [provider]  ?sodium  zirconium cyclosilicate (LOKELMA) 10 g PACK packet Take 10 g by mouth as directed. Take on Non-Dialysis (Sun,Tues, Thurs, Sat)   Yes [provider]  ?zolpidem (AMBIEN) 10 MG tablet Take 10 mg by mouth at bedtime as needed for sleep.   Yes [provider]  ?cycloSPORINE modified (NEORAL) 100 MG capsule Take 1 capsule (100 mg total) by mouth at bedtime. ?Patient not taking: Reported on 12/01/2021 11/17/21   Cherene Altes, MD  ?famotidine (PEPCID) 20 MG tablet Take 1 tablet (20 mg total) by mouth daily. ?Patient not taking: Reported on 12/01/2021 11/18/21   Cherene Altes, MD  ?fluconazole (DIFLUCAN) 100 MG tablet Take 1 tablet (100 mg total) by mouth daily. ?Patient not taking: Reported on 12/01/2021 11/18/21   Cherene Altes, MD  ?lactulose (CHRONULAC) 10 GM/15ML solution Take 45 mLs (30 g total) by mouth 2 (two) times daily. ?Patient not taking: Reported on 12/01/2021 11/17/21   Cherene Altes, MD  ?Methoxy PEG-Epoetin Beta (

## 2021-12-03 NOTE — Progress Notes (Signed)
Pt noted with minimal nose bleed; humidifier added to oxygen.  ?

## 2021-12-03 NOTE — Op Note (Signed)
[facility] ?Patient Name: [name] [name] ?Procedure Date : 12/03/2021 ?MRN: [id number] ?Attending MD: Estill Cotta. Loletha Carrow , MD ?Date of Birth: April 15, 1981 ?CSN: [id number] ?Age: 40 ?Admit Type: Inpatient ?Procedure:                Upper GI endoscopy ?Indications:              Iron deficiency anemia secondary to chronic blood  ?                          loss ?                          Acute on chronic, ESRD on HD, NSAID use ?Providers:                Estill Cotta. Loletha Carrow, MD, Elmer Ramp. Tilden Dome, RN, Alphonzo Grieve  ?                          Leighton Roach, Technician ?Referring MD:             Dr. Carol Ada ?Medicines:                Monitored Anesthesia Care ?Complications:            No immediate complications. ?Estimated Blood Loss:     Estimated blood loss was minimal. ?Procedure:                Pre-Anesthesia Assessment: ?                          - Prior to the procedure, a History and Physical  ?                          was performed, and patient medications and  ?                          allergies were reviewed. The patient's tolerance of  ?                          previous anesthesia was also reviewed. The risks  ?                          and benefits of the procedure and the sedation  ?                          options and risks were discussed with the patient.  ?                          All questions were answered, and informed consent  ?                          was obtained. Prior Anticoagulants: The patient has  ?                          taken no previous anticoagulant or antiplatelet  ?  agents. ASA Grade Assessment: IV - A patient with  ?                          severe systemic disease that is a constant threat  ?                          to life. After reviewing the risks and benefits,  ?                          the patient was deemed in satisfactory condition to  ?                          undergo the procedure. ?                          After obtaining informed consent, the  endoscope was  ?                          passed under direct vision. Throughout the  ?                          procedure, the patient's blood pressure, pulse, and  ?                          oxygen saturations were monitored continuously. The  ?                          [id number]-[id number] ([id number]) Olympus endoscope was introduced  ?                          through the mouth, and advanced to the second part  ?                          of duodenum. The upper GI endoscopy was  ?                          accomplished without difficulty. The patient  ?                          tolerated the procedure well. ?Scope In: ?Scope Out: ?Findings: ?     The larynx was normal. ?     The esophagus was normal. ?     Diffuse moderate inflammation characterized by adherent blood,bile  ?     staining, congestion (edema) and erythema was found in the entire  ?     examined stomach. Several biopsies were obtained on the greater  ?     curvature of the gastric body, on the lesser curvature of the gastric  ?     body, on the greater curvature of the gastric antrum and on the lesser  ?     curvature of the gastric antrum with cold forceps for histology. ?     The exam of the stomach was otherwise normal. ?     Patchy mildly erythematous mucosa was found in the duodenal bulb. ?Impression:               -  Normal larynx. ?                          - Normal esophagus. ?                          - Gastritis. ?                          - Erythematous duodenopathy. ?                          - Several biopsies were obtained on the greater  ?                          curvature of the gastric body, on the lesser  ?                          curvature of the gastric body, on the greater  ?                          curvature of the gastric antrum and on the lesser  ?                          curvature of the gastric antrum. ?Recommendation:           - Return patient to hospital ward for ongoing care. ?                          - Clear liquid diet. ?                           - Perform a colonoscopy tomorrow. (patient took  ?                          insufficient bowel prep overnight). No overt  ?                          bleeding at present. If prep and procedure tomorrow  ?                          not feasible with HD schedule, then the following  ?                          day. ?                          - CBC tonight and tomorrow AM - CMP tomorrow ?                          - Hemodialysis as scheduled. ?                          - Await pathology results. ?Procedure Code(s):        --- Professional --- ?  37048, Esophagogastroduodenoscopy, flexible,  ?                          transoral; with biopsy, single or multiple ?Diagnosis Code(s):        --- Professional --- ?                          K29.70, Gastritis, unspecified, without bleeding ?                          K31.89, Other diseases of stomach and duodenum ?                          D50.0, Iron deficiency anemia secondary to blood  ?                          loss (chronic) ?CPT copyright 2019 American Medical Association. All rights reserved. ?The codes documented in this report are preliminary and upon coder review may  ?be revised to meet current compliance requirements. ?Cerise Lieber L. Loletha Carrow, MD ?12/03/2021 9:27:09 AM ?This report has been signed electronically. ?Number of Addenda: 0 ?

## 2021-12-03 NOTE — Anesthesia Postprocedure Evaluation (Signed)
Anesthesia Post Note ? ?Patient: [name] ? ?Procedure(s) Performed: ESOPHAGOGASTRODUODENOSCOPY (EGD) WITH PROPOFOL ?BIOPSY ? ?  ? ?Patient location during evaluation: Endoscopy ?Anesthesia Type: MAC ?Level of consciousness: awake ?Pain management: pain level controlled ?Vital Signs Assessment: post-procedure vital signs reviewed and stable ?Respiratory status: spontaneous breathing, nonlabored ventilation, respiratory function stable and patient connected to nasal cannula oxygen ?Cardiovascular status: stable and blood pressure returned to baseline ?Postop Assessment: no apparent nausea or vomiting ?Anesthetic complications: no ? ? ?No notable events documented. ? ?Last Vitals:  ?Vitals:  ? 12/03/21 1244 12/03/21 1719  ?BP: (!) 184/89 (!) 179/75  ?Pulse: 93 92  ?Resp: 16 15  ?Temp: 36.8 ?C 36.8 ?C  ?SpO2: 97% 95%  ?  ?Last Pain:  ?Vitals:  ? 12/03/21 1845  ?TempSrc:   ?PainSc: 4   ? ? ?  ?  ?  ?  ?  ?  ? ?Geanette Buonocore P [name] ? ? ? ? ?

## 2021-12-03 NOTE — Progress Notes (Signed)
?PROGRESS NOTE ? ? ? ?[name] [name]  GBT:[id number] DOB: 06-23-1981 DOA: 12/01/2021 ?PCP: [facility]  ? ? 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  Patient reports 3-day history of severe right-sided headache, weakness of the entire right side of her body, nausea/vomiting, right-sided abdominal pain, chest pain and shortness of breath.  Symptoms started after dialysis on Monday and have persisted since.  She also went to dialysis on Wednesday and reported full session.  Denies any history of falls or head injury.  Denies hematemesis, no hematochezia, no melena.  Reports morphine does not help with her headache and requesting Dilaudid.  Has not been taking aspirin for several weeks and she has run out. ? ?Recently admitted 3/4-3/16 for acute onset confusion.  She was also treated for COVID-pneumonia.  She was admitted to the ICU with acute encephalopathy and progressively worsening respiratory status.  Encephalopathy was felt to be multifactorial due to uremia, acute CVA, subdural hematoma, polypharmacy/opiate overdose, and elevated ammonia.  LP was done and without evidence of meningitis.  No seizure on EEG.  She was continued on lactulose and narcotic dose decreased.  MRI of brain done 3/8 noted small acute infarcts in posterior paramidline cerebellum bilaterally.  Neurology suspected concomitant small vessel disease versus hypercoagulability from COVID, no large vessel disease.  Anterior left frontal subdural hemorrhage noted initially on MRI 3/8,  unclear etiology and felt to be possibly related to coagulopathy. Neurosurgery recommended repeat head CT in 2 weeks.   ?  ?She was seen at [facility] ED on 3/29 for headache and CT showed 1.2 cm extra-axial collection overlying the right cerebral convexity consistent with acute on chronic subdural hematoma with local mass effect on surrounding parenchyma with no substantial midline shift. ? ?In the ED, temperature 98.9 ?F, HR 87, RR 12, BP 156/81.  WBC 2.6, hemoglobin 6.8, platelets clumped.  Sodium 138, potassium 5.5, chloride 95, CO2 32, BUN 27, creatinine 6.67, glucose 98.  Lipase 23, AST 12, ALT 10.  BNP 958.2.  High sensitive troponin 16> 16.  INR 1.3.  FOBT positive.  CT head without contrast with low attenuating right frontal subdural collection likely evolution of the previously bleed, mild associated mass effect and compression of the right frontal lobe, no midline shift, old left cerebellar infarct.  CT abdomen/pelvis with diffuse moderate GGO lung parenchyma with moderate severity areas of bibasilar atelectasis, right pelvic renal transplant, renal osteodystrophy, stable lytic lesion in T9 vertebral body.  Neurosurgery, Dr. Cyndy Freeze was consulted and recommended no surgical intervention at this time and did not feel that the hematoma is causing the patient's symptoms.  Patient was given Dilaudid, Ativan, morphine, Zofran and IV Protonix.  1 unit PRBC ordered.  Hospital service consulted for further evaluation and management of acute blood loss anemia.  ? ? ?Assessment & Plan: ?  ?Assessment and Plan: ?* Subdural hematoma ([place]) ?Unclear etiology, discovered during recent admission.  Patient denies history of any falls or head injury.  She is presenting with complaints of severe right-sided headaches, lightheadedness, nausea, and vomiting for the past 3 days.  CT head showing low attenuating right frontal subdural collection likely evolution of  the previously seen bleed.  Mild associated mass effect and  compression of the right frontal lobe.  No midline shift. Neurosurgery consulted; Dr. Cyndy Freeze and recommended no surgical intervention at this time and did not feel that the hematoma is causing the patient's symptoms.  No focal neurodeficit on exam.  ?--Dilaudid 0.5 mg IV q4h PRN severe pain  ?--antiemetic as needed for nausea.   ?--neurochecks  ?-- Outpatient follow-up with neurosurgery, Dr. Zada Finders ? ?Acute blood loss anemia ?Patient presenting to ED with dizziness, weakness, fatigue and was found to have a hemoglobin of 6.1.  Previously hemoglobin 9.2 on 11/17/2021.  Anemia panel with iron 39, TIBC 172, ferritin 814, B12 744, folate 16.1 consistent with anemia of chronic disease.  Denies melena or hematochezia.  No current use of anticoagulants/antiplatelets.  FOBT positive.  EGD 12/03/2021 with gastritis and erythematous duodenopathy. ?--Gastroenterology following; appreciate assistance ?--Transfused 2 unit PRBC 3/31 ?--Protonix 40 mg IV Q12h ?--H/H q12h ?--Transfuse for hemoglobin less than 7.0 or active bleeding ?--Colonoscopy planned for 4/1 ? ?Chest pain ?Symptoms likely related to volume overload. ACS less likely as high-sensitivity troponin negative x2.  She is on 2 L supplemental oxygen and oxygenating well with no respiratory distress.  Unclear whether she was hypoxic or supplemental oxygen was placed in the ED for comfort. Chest x-ray showing mild to moderate severity interstitial edema with moderate severely bilateral airspace disease.  Recently treated for COVID pneumonia and do not think this is active COVID infection.  Low suspicion for bacterial pneumonia given no fever. Leukopenia seen on previous labs as well.  PE less likely given no tachycardia or hypotension. ?--Continue volume management with HD ?--Monitor on telemetry ? ?Hypertensive urgency ?Etiology likely secondary to volume overload.  Currently on Catapres patch 0.1 mg weekly.  Was previously also on amlodipine 10 mg p.o. daily and  losartan 100 mg p.o. daily that was discontinued during previous hospitalization. ?--Nephrology consulted for volume management with HD ?--Clonidine patch ?--Amlodipine 10 mg p.o. daily ?--Hydralazine IV PRN ?--Continue monitor BP closely and adjust accordingly ? ?ESRD on dialysis [facility]) ?Patient dialyzes on a Monday/Wednesday/Friday schedule, last HD Wednesday, reported as full session. ?--Nephrology consulted for continued HD while inpatient ? ?Abdominal pain ?Patient is endorsing right-sided abdominal pain.  History of pancreatic transplant and CT showing very mild peripancreatic inflammatory fat stranding without evidence of ductal dilatation.  Acute pancreatitis less likely given normal lipase and no complaints of epigastric abdominal pain. Abdominal exam benign. ?--Supportive care ? ?History of renal transplant ?--mycophenolate 540 mg twice daily ?--cyclosporine 100 mg twice daily ?--prednisone 15 mg daily.   ? ?Type 1 diabetes ([place]) ?History of pancreatic transplant and not requiring medical treatment for diabetes.  CBGs currently well controlled. ?--CBGs qAC/HS ? ?Thrombocytopenia ([place]) ?Chronic and likely due to end-stage renal disease.  Platelet count 115k, was 129k on 3/16. ?--Plt count 140 this am ?--Continue to monitor ? ?Abnormal finding on CT scan ?CT showing a stable lytic lesion within the T9 vertebral body on the right.  A tiny posterior pathologic fracture deformity of indeterminate age cannot be excluded.  Patient is not endorsing any back pain. ?--Continue to monitor ? ? ? ?DVT prophylaxis: SCDs Start: 12/02/21 0322 ? ?  Code Status: Full Code ?Family Communication: No family present at bedside this morning ? ?Disposition Plan:  ?Level of care: Progressive ?Status is: Inpatient ?Remains inpatient appropriate because: Pending colonoscopy tomorrow, if hemoglobin remains stable, likely discharge home following procedure. ? ? ?  ? ?Consultants:  ?Nephrology ?Gastroenterology ? ?  Procedures:  ?EGD  4/1 ?Colonoscopy planned for 4/2 ? ?Antimicrobials:  ?None ? ? ?Subjective: ?Patient seen examined bedside, resting comfortably.  Just returned from endoscopy with EGD with findings of gastritis, erythematous duodeno

## 2021-12-03 NOTE — Interval H&P Note (Signed)
History and Physical Interval Note: ? ?12/03/2021 ?8:53 AM ? ?[name] [name]  has presented today for surgery, with the diagnosis of IDA and heme positive stool.  The various methods of treatment have been discussed with the patient and family. After consideration of risks, benefits and other options for treatment, the patient has consented to  Procedure(s): ?ESOPHAGOGASTRODUODENOSCOPY (EGD) WITH PROPOFOL (N/A) as a surgical intervention.  The patient's history has been reviewed, patient examined, no change in status, stable for surgery.  I have reviewed the patient's chart and labs.  Questions were answered to the patient's satisfaction.   ? ? ?Signout on this patient received through Dr. Rush Landmark, as we are covering for Dr. Benson Norway this weekend.  I performed a chart review as well. ?This patient was also seen and examined in the endoscopy preprocedure area. ? ?She took little of the bowel preparation and only had 1 bowel movement this morning, thus the colonoscopy is canceled.  Upper endoscopy is planned, new consent obtained after full discussion with the patient regarding risks and benefits. ? The benefits and risks of the planned procedure were described in detail with the patient or (when appropriate) their health care proxy.  Risks were outlined as including, but not limited to, bleeding, infection, perforation, adverse medication reaction leading to cardiac or pulmonary decompensation, pancreatitis (if ERCP).  The limitation of incomplete mucosal visualization was also discussed.  No guarantees or warranties were given. ? ?After the procedure, we will most likely need to reconsider and plan the timing of colonoscopy after she has more bowel preparation. ? ?Case discussed with the anesthesia team, potassium and bicarb normal today, hemoglobin 8.8. ? ?Nelida Meuse III ? ? ?

## 2021-12-03 NOTE — Progress Notes (Signed)
Patient educated re: bowel prep multiple times. Stated "I will take it, but I haven't eaten for several days so I have nothing to poo".  ? ?Checked as often as possible but patient will take a sip when followed-up and will stop drinking. AT 6AM, patient just took about half; MD notified.  ?

## 2021-12-04 ENCOUNTER — Encounter (HOSPITAL_COMMUNITY): Payer: Self-pay | Admitting: Internal Medicine

## 2021-12-04 ENCOUNTER — Encounter (HOSPITAL_COMMUNITY): Payer: Self-pay | Admitting: Certified Registered Nurse Anesthetist

## 2021-12-04 ENCOUNTER — Encounter (HOSPITAL_COMMUNITY)
Admission: EM | Disposition: A | Discharge status: Home / Self Care | Payer: Self-pay | Source: Home / Self Care | Attending: Internal Medicine

## 2021-12-04 LAB — HEPATITIS B SURFACE ANTIGEN: Hepatitis B Surface Ag: NONREACTIVE

## 2021-12-04 LAB — CBC
HCT: 29.3 % — ABNORMAL LOW (ref 36.0–46.0)
Hemoglobin: 9.5 g/dL — ABNORMAL LOW (ref 12.0–15.0)
MCH: 29.8 pg (ref 26.0–34.0)
MCHC: 32.4 g/dL (ref 30.0–36.0)
MCV: 91.8 fL (ref 80.0–100.0)
Platelets: 205 10*3/uL (ref 150–400)
RBC: 3.19 MIL/uL — ABNORMAL LOW (ref 3.87–5.11)
RDW: 15 % (ref 11.5–15.5)
WBC: 5 10*3/uL (ref 4.0–10.5)
nRBC: 0 % (ref 0.0–0.2)

## 2021-12-04 LAB — GLUCOSE, CAPILLARY
Glucose-Capillary: 114 mg/dL — ABNORMAL HIGH (ref 70–99)
Glucose-Capillary: 82 mg/dL (ref 70–99)
Glucose-Capillary: 88 mg/dL (ref 70–99)
Glucose-Capillary: 94 mg/dL (ref 70–99)

## 2021-12-04 LAB — RENAL FUNCTION PANEL
Albumin: 2.8 g/dL — ABNORMAL LOW (ref 3.5–5.0)
Anion gap: 14 (ref 5–15)
BUN: 35 mg/dL — ABNORMAL HIGH (ref 6–20)
CO2: 26 mmol/L (ref 22–32)
Calcium: 9.1 mg/dL (ref 8.9–10.3)
Chloride: 96 mmol/L — ABNORMAL LOW (ref 98–111)
Creatinine, Ser: 6.64 mg/dL — ABNORMAL HIGH (ref 0.44–1.00)
GFR, Estimated: 8 mL/min — ABNORMAL LOW (ref >60)
Glucose, Bld: 94 mg/dL (ref 70–99)
Phosphorus: 5 mg/dL — ABNORMAL HIGH (ref 2.5–4.6)
Potassium: 4.4 mmol/L (ref 3.5–5.1)
Sodium: 136 mmol/L (ref 135–145)

## 2021-12-04 LAB — HEPATITIS B SURFACE ANTIBODY,QUALITATIVE: Hep B S Ab: REACTIVE — AB

## 2021-12-04 SURGERY — CANCELLED PROCEDURE

## 2021-12-04 MED ORDER — LIP MEDEX EX OINT
TOPICAL_OINTMENT | CUTANEOUS | Status: DC | PRN
  Administered 2021-12-04: 75 via TOPICAL
  Filled 2021-12-04: qty 7

## 2021-12-04 MED ORDER — CATAPRES-TTS-1 0.1 MG/24HR TD PTWK: 0.1000 mg | MEDICATED_PATCH | TRANSDERMAL | 2 refills | Status: DC

## 2021-12-04 MED ORDER — AMLODIPINE BESYLATE 10 MG PO TABS: 10.0000 mg | ORAL_TABLET | Freq: Every day | ORAL | 2 refills | Status: DC

## 2021-12-04 MED ORDER — LOSARTAN POTASSIUM 50 MG PO TABS
50.0000 mg | ORAL_TABLET | Freq: Every day | ORAL | Status: DC
  Administered 2021-12-04: 50 mg via ORAL
  Filled 2021-12-04: qty 1

## 2021-12-04 MED ORDER — OXYCODONE HCL 5 MG PO TABS: 5.0000 mg | ORAL_TABLET | ORAL | 0 refills | Status: AC | PRN

## 2021-12-04 MED ORDER — PANTOPRAZOLE SODIUM 40 MG PO TBEC
40.0000 mg | DELAYED_RELEASE_TABLET | Freq: Every day | ORAL | 2 refills | Status: DC
Start: 2021-12-04 — End: 2021-12-25

## 2021-12-04 MED ORDER — HYDROMORPHONE HCL 1 MG/ML IJ SOLN
0.5000 mg | Freq: Once | INTRAMUSCULAR | Status: AC
  Administered 2021-12-04: 0.5 mg via INTRAVENOUS
  Filled 2021-12-04: qty 0.5

## 2021-12-04 MED ORDER — LOSARTAN POTASSIUM 100 MG PO TABS: 100.0000 mg | ORAL_TABLET | Freq: Every day | ORAL | 2 refills | Status: DC

## 2021-12-04 SURGICAL SUPPLY — 21 items
ELECT REM PT RETURN 9FT ADLT (ELECTROSURGICAL)
ELECTRODE REM PT RTRN 9FT ADLT (ELECTROSURGICAL) IMPLANT
FCP BXJMBJMB 240X2.8X (CUTTING FORCEPS)
FLOOR PAD 36X40 (MISCELLANEOUS) ×3
FORCEPS BIOP RAD 4 LRG CAP 4 (CUTTING FORCEPS) IMPLANT
FORCEPS BIOP RJ4 240 W/NDL (CUTTING FORCEPS)
FORCEPS BXJMBJMB 240X2.8X (CUTTING FORCEPS) IMPLANT
INJECTOR/SNARE I SNARE (MISCELLANEOUS) IMPLANT
LUBRICANT JELLY 4.5OZ STERILE (MISCELLANEOUS) IMPLANT
MANIFOLD NEPTUNE II (INSTRUMENTS) IMPLANT
NEEDLE SCLEROTHERAPY 25GX240 (NEEDLE) IMPLANT
PAD FLOOR 36X40 (MISCELLANEOUS) ×2 IMPLANT
PROBE APC STR FIRE (PROBE) IMPLANT
PROBE INJECTION GOLD (MISCELLANEOUS)
PROBE INJECTION GOLD 7FR (MISCELLANEOUS) IMPLANT
SNARE ROTATE MED OVAL 20MM (MISCELLANEOUS) IMPLANT
SYR 50ML LL SCALE MARK (SYRINGE) IMPLANT
TRAP SPECIMEN MUCOUS 40CC (MISCELLANEOUS) IMPLANT
TUBING ENDO SMARTCAP PENTAX (MISCELLANEOUS) IMPLANT
TUBING IRRIGATION ENDOGATOR (MISCELLANEOUS) ×3 IMPLANT
WATER STERILE IRR 1000ML POUR (IV SOLUTION) IMPLANT

## 2021-12-04 NOTE — Progress Notes (Signed)
Patient declined to take moviprep. Education provided multiple times; continued to refuse. Dr. Hal Hope notified.  ? ?Patient consumed the golytely (that was started at 12/02/21 @ 8PM) at about 11PM. Stated her bowels has been clear; bowels has been flushed before RN can assess,  ?

## 2021-12-04 NOTE — Progress Notes (Addendum)
Patient agreed to start taking moviprep at around 4AM, able to take about 738m (out of 20061m.  ? ?Patient also noted to have HR of 110-120's, MD notified. EKG done, hydralazine given.  ?

## 2021-12-04 NOTE — Anesthesia Preprocedure Evaluation (Deleted)
Anesthesia Evaluation  ?Patient identified by MRN, date of birth, ID band ?Patient awake ? ? ? ?Reviewed: ?Allergy & Precautions, H&P , NPO status , Patient's Chart, lab work & pertinent test results ? ?Airway ?Mallampati: II ? ? ?Neck ROM: full ? ? ? Dental ?  ?Pulmonary ?shortness of breath, former smoker,  ?  ?breath sounds clear to auscultation ? ? ? ? ? ? Cardiovascular ?hypertension,  ?Rhythm:regular Rate:Normal ? ? ?  ?Neuro/Psych ?PSYCHIATRIC DISORDERS Anxiety  Neuromuscular disease   ? GI/Hepatic ?GI bleeding ?  ?Endo/Other  ?diabetes ? Renal/GU ?ESRF and DialysisRenal disease  ? ?  ?Musculoskeletal ? ? Abdominal ?  ?Peds ? Hematology ? ?(+) Blood dyscrasia, anemia ,   ?Anesthesia Other Findings ? ? Reproductive/Obstetrics ? ?  ? ? ? ? ? ? ? ? ? ? ? ? ? ?  ?  ? ? ? ? ? ? ? ? ?Anesthesia Physical ?Anesthesia Plan ? ?ASA: 3 ? ?Anesthesia Plan: MAC  ? ?Post-op Pain Management:   ? ?Induction: Intravenous ? ?PONV Risk Score and Plan: 2 and Propofol infusion and Treatment may vary due to age or medical condition ? ?Airway Management Planned: Nasal Cannula ? ?Additional Equipment:  ? ?Intra-op Plan:  ? ?Post-operative Plan:  ? ?Informed Consent: I have reviewed the patients History and Physical, chart, labs and discussed the procedure including the risks, benefits and alternatives for the proposed anesthesia with the patient or authorized representative who has indicated his/her understanding and acceptance.  ? ? ? ?Dental advisory given ? ?Plan Discussed with: CRNA, Anesthesiologist and Surgeon ? ?Anesthesia Plan Comments:   ? ? ? ? ? ? ?Anesthesia Quick Evaluation ? ?

## 2021-12-04 NOTE — Progress Notes (Incomplete)
Patient calling for dilaudid the exact minute medication is due; education provided. Patient offered oxycodone which she declined, stated that only dilaudid works for her. Dilaudid 0.'5mg'$  given.  ?

## 2021-12-04 NOTE — Progress Notes (Addendum)
Patient had scheduled colonoscopy for today but patient was unable to complete bowel prep.  Per shift report, patient's last BM was soft not liquid and clear.  Endoscopy informed of same and spoke with MD, colonoscopy to be cancelled and gastroenterology physician will come and discuss with patient.  ? ?Patient continuing to have BMs this morning.  RN assessed two BMs, first BM was liquid with small amount of mushy dark brown stool.   Second BM assessed was clear liquid.   Paged Gastroenterology to inform of status. ? ?Spoke with PA Silverio Lay, colonoscopy will remain cancelled, patient okay for clear liquids at this time and PA Silverio Lay will round bedside today on patient.   ? ?

## 2021-12-04 NOTE — Progress Notes (Signed)
Patient's labs from 1800 on 4/1 and AM labs 4/2 not showing in results.  Called and spoke with phlebotomy, multiple phlebotomists attempted to obtain labs 4/1 and were unsuccessful, per phlebotomy protocol they can not attempt to draw labs again until 1100 today 12/04/2021.  ?

## 2021-12-04 NOTE — Discharge Summary (Signed)
?Physician Discharge Summary  ?[name] [name] DTO:[id number] DOB: 08-02-1981 DOA: 12/01/2021 ? ?PCP: [facility] [place] ? ?Admit date: 12/01/2021 ?Discharge date: 12/04/2021 ? ?Admitted From: Home ?Disposition: Home ? ?Recommendations for Outpatient Follow-up:  ?Follow up with PCP in 1-2 weeks ?Follow-up with neurosurgery, Dr. Zada Finders in 2 weeks for SDH ?Follow-up with gastroenterology, Dr. Benson Norway in 2 weeks for consideration of colonoscopy ?Please obtain CBC in one week to reassess hemoglobin level ?Continue monitor BP and adjust antihypertensives as needed ? ?Home Health: No ?Equipment/Devices: None ? ?Discharge Condition: Stable ?CODE STATUS: Full code ?Diet recommendation: Renal/consistent carbohydrate diet, fluid restriction 1200 mL/day ? ?History of present illness: ? 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  Patient reports 3-day history of severe right-sided headache, weakness of the entire right side of her body, nausea/vomiting, right-sided abdominal pain, chest pain and shortness of breath.  Symptoms started after dialysis on Monday and have persisted since.  She also went to dialysis on Wednesday and reported full session.  Denies any history of falls or head injury.  Denies hematemesis, no hematochezia, no melena.  Reports morphine does not help with her headache and requesting Dilaudid.  Has not been taking aspirin for several weeks and she has run out. ? ?Recently admitted 3/4-3/16 for acute onset confusion.  She was also treated for COVID-pneumonia.  She was admitted  to the ICU with acute encephalopathy and progressively worsening respiratory status.  Encephalopathy was felt to be multifactorial due to uremia, acute CVA, subdural hematoma, polypharmacy/opiate overdose, and elevated ammonia.  LP was done and without evidence of meningitis.  No seizure on EEG.  She was continued on lactulose and narcotic dose decreased.  MRI of brain done 3/8 noted small acute infarcts in posterior paramidline cerebellum bilaterally.  Neurology suspected concomitant small vessel disease versus hypercoagulability from COVID, no large vessel disease.  Anterior left frontal subdural hemorrhage noted initially on MRI 3/8, unclear etiology and felt to be possibly related to coagulopathy. Neurosurgery recommended repeat head CT in 2 weeks.   ?  ?She was seen at [facility] ED on 3/29 for headache and CT showed 1.2 cm extra-axial collection overlying the right cerebral convexity consistent with acute on chronic subdural hematoma with local mass effect on surrounding parenchyma with no substantial midline shift. ? ?In the ED, temperature 98.9 ?F, HR 87, RR 12, BP 156/81.  WBC 2.6, hemoglobin 6.8, platelets clumped.  Sodium 138, potassium 5.5, chloride 95, CO2 32, BUN 27, creatinine 6.67, glucose 98.  Lipase 23, AST 12, ALT 10.  BNP 958.2.  High sensitive troponin 16> 16.  INR 1.3.  FOBT positive.  CT head without contrast with low attenuating right frontal subdural collection likely evolution of the previously bleed, mild associated mass effect and compression of the right frontal lobe, no midline shift, old left cerebellar infarct.  CT abdomen/pelvis with diffuse moderate GGO lung parenchyma with moderate severity areas of bibasilar atelectasis, right pelvic renal transplant, renal osteodystrophy, stable lytic lesion in T9 vertebral body.  Neurosurgery, Dr. Cyndy Freeze was consulted and recommended no surgical intervention at this time and did not feel that the hematoma is causing the patient's symptoms.   Patient was given  Dilaudid, Ativan, morphine, Zofran and IV Protonix.  1 unit PRBC ordered.  Hospital service consulted for further evaluation and management of acute blood loss anemia. ? ?Hospital course: ? ?Assessment and Plan: ?* Subdural hematoma ([place]) ?Unclear etiology, discovered during recent admission.  Patient denies history of any falls or head injury.  She is presenting with complaints of severe right-sided headaches, lightheadedness, nausea, and vomiting for the past 3 days.  CT head showing low attenuating right frontal subdural collection likely evolution of the previously seen bleed.  Mild associated mass effect and compression of the right frontal lobe.  No midline shift. Neurosurgery consulted; Dr. Cyndy Freeze and recommended no surgical intervention at this time and did not feel that the hematoma is causing the patient's symptoms.  No focal neurodeficit on exam. Outpatient follow-up with neurosurgery, Dr. Zada Finders. ? ?Acute blood loss anemia ?Patient presenting to ED with dizziness, weakness, fatigue and was found to have a hemoglobin of 6.1.  Previously hemoglobin 9.2 on 11/17/2021.  Anemia panel with iron 39, TIBC 172, ferritin 814, B12 744, folate 16.1 consistent with anemia of chronic disease.  Denies melena or hematochezia.  No current use of anticoagulants/antiplatelets.  FOBT positive.  Patient was transfused 2 unit PRBCs on 12/02/2021.  EGD 12/03/2021 with gastritis and erythematous duodenopathy.  Attempted colonoscopy but poor prep.  Gastroenterology wanted to continue the prep overnight with attempt at colonoscopy once again, but patient declines and wishes to follow-up outpatient.  Protonix 40 mg p.o. daily.  Recommend repeat CBC 1 week.  Outpatient follow-up with gastroenterology, Dr. Benson Norway for consideration of repeat colonoscopy. ? ?Chest pain ?Symptoms likely related to volume overload. ACS less likely as high-sensitivity troponin negative x2.  She is on 2 L supplemental oxygen and oxygenating  well with no respiratory distress.  Unclear whether she was hypoxic or supplemental oxygen was placed in the ED for comfort. Chest x-ray showing mild to moderate severity interstitial edema with moderate severely bilateral airspace disease.  Recently treated for COVID pneumonia and do not think this is active COVID infection.  Low suspicion for bacterial pneumonia given no fever. Leukopenia seen on previous labs as well.  PE less likely given no tachycardia or hypotension.  Patient received hemodialysis and oxygen was titrated off during hospitalization. ? ?Hypertensive urgency ?Etiology likely secondary to volume overload.  Currently on Catapres patch 0.1 mg weekly.  Was previously also on amlodipine 10 mg p.o. daily and losartan 100 mg p.o. daily that was discontinued during previous hospitalization.  Nephrology was consulted for volume management with HD during hospitalization.  Continue clonidine 0.1 mg patch weekly.  Restarted amlodipine 10 mg p.o. daily, losartan 100 mg p.o. daily.  Outpatient follow-up with PCP. ? ?ESRD on dialysis [facility]) ?Patient dialyzes on a Monday/Wednesday/Friday schedule.  Nephrology was consulted during hospitalization for continued hemodialysis.  Follows with nephrology outpatient, Dr. Moshe Cipro. ? ?Abdominal pain ?Patient is endorsing right-sided abdominal pain.  History of pancreatic transplant and CT showing very mild peripancreatic inflammatory fat stranding without evidence of ductal dilatation.  Acute pancreatitis less likely given normal lipase and no complaints of epigastric abdominal pain. Abdominal exam benign.  Resolved at time of discharge. ? ?History of renal transplant ?Continue immunosuppressants with mycophenolate, cyclosporine, prednisone .   ? ?Type 1 diabetes ([place]) ?History of pancreatic transplant and not requiring medical treatment for diabetes.  CBGs currently well controlled. ? ?Thrombocytopenia ([place]) ?Chronic and likely due to end-stage renal disease.   Platelet count 205 at time of discharge. ? ?Abnormal finding on CT  scan ?CT showing a stable lytic lesion within the T9 vertebral body on the right.  A tiny posterior pathologic fracture deformity of indeter

## 2021-12-04 NOTE — Progress Notes (Signed)
Admit: 12/01/2021 ?LOS: 2 ? ?38F ESRD MWF with recent SDH, ongoing HAs, and ABLA for GI eval ? ?Subjective:  ?C/o the bowel prep for CSY ?EGD negative yesterday for bleeding ?No other issues  ? ?04/01 0701 - 04/02 0700 ?In: 1440 [P.O.:1440] ?Out: -  ? ?Filed Weights  ? 12/02/21 1440 12/02/21 1835 12/03/21 0835  ?Weight: 60.6 kg 57.5 kg 57.5 kg  ? ? ?Scheduled Meds: ? amLODipine  10 mg Oral Daily  ? Chlorhexidine Gluconate Cloth  6 each Topical Q0600  ? cloNIDine  0.1 mg Transdermal Weekly  ? cycloSPORINE modified  100 mg Oral BID  ?  HYDROmorphone (DILAUDID) injection  0.25 mg Intravenous Once  ? mycophenolate  540 mg Oral BID  ? ondansetron (ZOFRAN) IV  4 mg Intravenous Q6H  ? pantoprazole  40 mg Oral QAC breakfast  ? peg 3350 powder  0.5 kit Oral Once  ? predniSONE  15 mg Oral Q breakfast  ? ?Continuous Infusions: ? sodium chloride    ? sodium chloride    ? sodium chloride    ? ?PRN Meds:.sodium chloride, sodium chloride, acetaminophen **OR** acetaminophen, dextrose, hydrALAZINE, HYDROmorphone (DILAUDID) injection, lidocaine (PF), lidocaine-prilocaine, lip balm, naLOXone (NARCAN)  injection, oxyCODONE, pentafluoroprop-tetrafluoroeth, prochlorperazine ? ?Current Labs: reviewed  ? ?Physical Exam:  Blood pressure (!) 168/73, pulse (!) 103, temperature 99 ?F (37.2 ?C), temperature source Oral, resp. rate 13, height '5\' 4"'  (1.626 m), weight 57.5 kg, SpO2 94 %. ?General: Chronically ill appearing female, alert and in NAD ?Head: NCAT ?Lungs: CTAB nl WOB ?Heart: RRR nl s1s2 no mgr ?Abdomen: S/NT ?Lower extremities: No edema or ischemic changes, no open wounds. ?Neuro: Alert and oriented X 3. Moves all extremities spontaneously. ?Psych:  Responds to questions appropriately with a normal affect. ?Dialysis Access: AVF + t/b ? ?A ?Headaches/recent subdural hematoma: Unclear origin. No surgical intervention recommended. Headache may be partially due to high BP. Management per primary team.  ? ESRD:  Attends dialysis on MWF but  has issues with volume overload/nonadherence. Will continue HD on MWF schedule. No heparin. ? Hypertension/volume: Volume overloaded and BP elevated on admission. S/p 3L UF 3/31. Respiratory status is stable will continue UF as tolerated with next HD. Continue hydralazine and clonidine.  ? Anemia: Hgb 8.8 s/p 2 unit PRBC. Neg EGD 4/1, CSY planned.  Management per GI.  ? Metabolic bone disease: Calcium and phosphorus at goal, resume binders once tolerating PO ?Hx renal transplant: On mycophenolate, cyclosporine and prednisone ? ?P ?As above, HD tomorrow on schedule: 2K, AVF, 3.5h, no heparin ?Medication Issues; ?Preferred narcotic agents for pain control are hydromorphone, fentanyl, and methadone. Morphine should not be used.  ?Baclofen should be avoided ?Avoid oral sodium phosphate and magnesium citrate based laxatives / bowel preps  ? ? ?Pearson Grippe MD ?12/04/2021, 10:16 AM ? ?Recent Labs  ?Lab 12/01/21 ?2021 12/02/21 ?0825 12/03/21 ?0153  ?NA 138 138 136  ?K 5.5* 5.5* 4.0  ?CL 95* 97* 96*  ?CO2 32 28 29  ?GLUCOSE 98 83 80  ?BUN 27* 29* 11  ?CREATININE 6.67* 7.33* 3.95*  ?CALCIUM 8.8* 8.6* 8.2*  ?PHOS  --   --  4.6  ? ?Recent Labs  ?Lab 12/01/21 ?2021 12/01/21 ?2211 12/02/21 ?0825 12/02/21 ?2200 12/03/21 ?0153  ?WBC 2.6* 2.5* 3.3*  --  2.8*  ?NEUTROABS 1.4* 1.3*  --   --   --   ?HGB 6.8* 6.1* 7.3* 8.7* 8.8*  ?HCT 20.9* 18.8* 23.3* 25.9* 25.5*  ?MCV 96.3 96.4 96.3  --  90.4  ?PLT PLATELET CLUMPS NOTED ON SMEAR, UNABLE TO ESTIMATE 115* 127*  --  140*  ? ? ? ? ? ? ? ? ? ?  ?

## 2021-12-05 NOTE — TOC Transition Note (Signed)
Transition of care contact from inpatient facility ? ?Date of discharge: 12/04/21 ?Date of contact: 12/05/21 ?Method: Phone ?Spoke to: Patient ? ?Patient contacted to discuss transition of care from recent inpatient hospitalization. Patient was admitted to [facility] from 12/01/21-12/04/21 with discharge diagnosis of subdural hematoma and acute blood loss anemia. ? ?Medication changes were reviewed. ? ?Patient will follow up with his/her outpatient HD unit on 12/07/21 at [facility]. ? ?Tobie Poet, NP ?  ?

## 2021-12-06 LAB — SURGICAL PATHOLOGY

## 2021-12-06 LAB — HEPATITIS B SURFACE ANTIBODY, QUANTITATIVE: Hep B S AB Quant (Post): 506 m[IU]/mL (ref >9.9)

## 2021-12-09 ENCOUNTER — Telehealth: Payer: Self-pay

## 2021-12-09 NOTE — Telephone Encounter (Signed)
Attempted to contact patient to schedule a Palliative Care consult appointment. No answer left a message to return call.  

## 2021-12-15 ENCOUNTER — Encounter (HOSPITAL_COMMUNITY): Payer: Self-pay

## 2021-12-15 ENCOUNTER — Telehealth: Payer: Self-pay

## 2021-12-15 NOTE — Telephone Encounter (Signed)
Spoke with patient and scheduled a Mychart Palliative Consult for 12/22/21 @ 9AM. ? ?Consent obtained; updated Netsmart, Team List and Epic.  ? ?

## 2021-12-22 ENCOUNTER — Telehealth: Payer: Medicare Other | Admitting: Internal Medicine

## 2021-12-23 ENCOUNTER — Encounter (HOSPITAL_COMMUNITY): Payer: Self-pay | Admitting: Nephrology

## 2021-12-23 ENCOUNTER — Emergency Department (HOSPITAL_COMMUNITY): Payer: Medicare Other

## 2021-12-23 ENCOUNTER — Inpatient Hospital Stay (HOSPITAL_COMMUNITY)
Admission: EM | Admit: 2021-12-23 | Discharge: 2021-12-25 | DRG: 917 | Disposition: A | Discharge status: Home / Self Care | Payer: Medicare Other | Attending: Internal Medicine | Admitting: Internal Medicine

## 2021-12-23 DIAGNOSIS — Z992 Dependence on renal dialysis: Secondary | ICD-10-CM

## 2021-12-23 DIAGNOSIS — Z87891 Personal history of nicotine dependence: Secondary | ICD-10-CM

## 2021-12-23 DIAGNOSIS — J9601 Acute respiratory failure with hypoxia: Secondary | ICD-10-CM | POA: Diagnosis present

## 2021-12-23 DIAGNOSIS — Z8249 Family history of ischemic heart disease and other diseases of the circulatory system: Secondary | ICD-10-CM

## 2021-12-23 DIAGNOSIS — Y830 Surgical operation with transplant of whole organ as the cause of abnormal reaction of the patient, or of later complication, without mention of misadventure at the time of the procedure: Secondary | ICD-10-CM | POA: Diagnosis present

## 2021-12-23 DIAGNOSIS — N289 Disorder of kidney and ureter, unspecified: Secondary | ICD-10-CM

## 2021-12-23 DIAGNOSIS — G4700 Insomnia, unspecified: Secondary | ICD-10-CM | POA: Diagnosis present

## 2021-12-23 DIAGNOSIS — T402X1A Poisoning by other opioids, accidental (unintentional), initial encounter: Principal | ICD-10-CM | POA: Diagnosis present

## 2021-12-23 DIAGNOSIS — T8612 Kidney transplant failure: Secondary | ICD-10-CM | POA: Diagnosis present

## 2021-12-23 DIAGNOSIS — S065XAA Traumatic subdural hemorrhage with loss of consciousness status unknown, initial encounter: Secondary | ICD-10-CM | POA: Diagnosis present

## 2021-12-23 DIAGNOSIS — G8929 Other chronic pain: Secondary | ICD-10-CM | POA: Diagnosis present

## 2021-12-23 DIAGNOSIS — R400 Somnolence: Principal | ICD-10-CM

## 2021-12-23 DIAGNOSIS — Z940 Kidney transplant status: Secondary | ICD-10-CM

## 2021-12-23 DIAGNOSIS — R4182 Altered mental status, unspecified: Secondary | ICD-10-CM | POA: Diagnosis not present

## 2021-12-23 DIAGNOSIS — Z91199 Patient's noncompliance with other medical treatment and regimen due to unspecified reason: Secondary | ICD-10-CM

## 2021-12-23 DIAGNOSIS — N186 End stage renal disease: Secondary | ICD-10-CM | POA: Diagnosis present

## 2021-12-23 DIAGNOSIS — I10 Essential (primary) hypertension: Secondary | ICD-10-CM | POA: Diagnosis present

## 2021-12-23 DIAGNOSIS — Z9483 Pancreas transplant status: Secondary | ICD-10-CM

## 2021-12-23 DIAGNOSIS — F1120 Opioid dependence, uncomplicated: Secondary | ICD-10-CM | POA: Diagnosis present

## 2021-12-23 DIAGNOSIS — Z79899 Other long term (current) drug therapy: Secondary | ICD-10-CM

## 2021-12-23 DIAGNOSIS — E1129 Type 2 diabetes mellitus with other diabetic kidney complication: Secondary | ICD-10-CM | POA: Diagnosis present

## 2021-12-23 DIAGNOSIS — Z8616 Personal history of COVID-19: Secondary | ICD-10-CM

## 2021-12-23 DIAGNOSIS — Z91041 Radiographic dye allergy status: Secondary | ICD-10-CM

## 2021-12-23 DIAGNOSIS — G9341 Metabolic encephalopathy: Secondary | ICD-10-CM | POA: Diagnosis present

## 2021-12-23 DIAGNOSIS — E8770 Fluid overload, unspecified: Secondary | ICD-10-CM

## 2021-12-23 DIAGNOSIS — G9340 Encephalopathy, unspecified: Secondary | ICD-10-CM | POA: Diagnosis not present

## 2021-12-23 DIAGNOSIS — M898X9 Other specified disorders of bone, unspecified site: Secondary | ICD-10-CM | POA: Diagnosis present

## 2021-12-23 DIAGNOSIS — D631 Anemia in chronic kidney disease: Secondary | ICD-10-CM | POA: Diagnosis present

## 2021-12-23 DIAGNOSIS — I160 Hypertensive urgency: Secondary | ICD-10-CM | POA: Diagnosis present

## 2021-12-23 DIAGNOSIS — Z7951 Long term (current) use of inhaled steroids: Secondary | ICD-10-CM

## 2021-12-23 DIAGNOSIS — T8691 Unspecified transplanted organ and tissue rejection: Secondary | ICD-10-CM | POA: Diagnosis present

## 2021-12-23 LAB — CBC WITH DIFFERENTIAL/PLATELET
Abs Immature Granulocytes: 0.07 10*3/uL (ref 0.00–0.07)
Basophils Absolute: 0 10*3/uL (ref 0.0–0.1)
Basophils Relative: 1 %
Eosinophils Absolute: 0.1 10*3/uL (ref 0.0–0.5)
Eosinophils Relative: 1 %
HCT: 27.3 % — ABNORMAL LOW (ref 36.0–46.0)
Hemoglobin: 8.8 g/dL — ABNORMAL LOW (ref 12.0–15.0)
Immature Granulocytes: 1 %
Lymphocytes Relative: 14 %
Lymphs Abs: 1 10*3/uL (ref 0.7–4.0)
MCH: 30.6 pg (ref 26.0–34.0)
MCHC: 32.2 g/dL (ref 30.0–36.0)
MCV: 94.8 fL (ref 80.0–100.0)
Monocytes Absolute: 0.4 10*3/uL (ref 0.1–1.0)
Monocytes Relative: 6 %
Neutro Abs: 5.8 10*3/uL (ref 1.7–7.7)
Neutrophils Relative %: 77 %
Platelets: 169 10*3/uL (ref 150–400)
RBC: 2.88 MIL/uL — ABNORMAL LOW (ref 3.87–5.11)
RDW: 13.7 % (ref 11.5–15.5)
WBC: 7.4 10*3/uL (ref 4.0–10.5)
nRBC: 0 % (ref 0.0–0.2)

## 2021-12-23 LAB — COMPREHENSIVE METABOLIC PANEL
ALT: 13 U/L (ref 0–44)
AST: 17 U/L (ref 15–41)
Albumin: 3.8 g/dL (ref 3.5–5.0)
Alkaline Phosphatase: 93 U/L (ref 38–126)
Anion gap: 17 — ABNORMAL HIGH (ref 5–15)
BUN: 41 mg/dL — ABNORMAL HIGH (ref 6–20)
CO2: 25 mmol/L (ref 22–32)
Calcium: 10.3 mg/dL (ref 8.9–10.3)
Chloride: 98 mmol/L (ref 98–111)
Creatinine, Ser: 7.23 mg/dL — ABNORMAL HIGH (ref 0.44–1.00)
GFR, Estimated: 7 mL/min — ABNORMAL LOW (ref >60)
Glucose, Bld: 97 mg/dL (ref 70–99)
Potassium: 5.9 mmol/L — ABNORMAL HIGH (ref 3.5–5.1)
Sodium: 140 mmol/L (ref 135–145)
Total Bilirubin: 0.8 mg/dL (ref 0.3–1.2)
Total Protein: 7.9 g/dL (ref 6.5–8.1)

## 2021-12-23 LAB — I-STAT CHEM 8, ED
BUN: 43 mg/dL — ABNORMAL HIGH (ref 6–20)
Calcium, Ion: 1.02 mmol/L — ABNORMAL LOW (ref 1.15–1.40)
Chloride: 103 mmol/L (ref 98–111)
Creatinine, Ser: 8.3 mg/dL — ABNORMAL HIGH (ref 0.44–1.00)
Glucose, Bld: 99 mg/dL (ref 70–99)
HCT: 30 % — ABNORMAL LOW (ref 36.0–46.0)
Hemoglobin: 10.2 g/dL — ABNORMAL LOW (ref 12.0–15.0)
Potassium: 5.5 mmol/L — ABNORMAL HIGH (ref 3.5–5.1)
Sodium: 137 mmol/L (ref 135–145)
TCO2: 29 mmol/L (ref 22–32)

## 2021-12-23 LAB — I-STAT ARTERIAL BLOOD GAS, ED
Acid-Base Excess: 6 mmol/L — ABNORMAL HIGH (ref 0.0–2.0)
Bicarbonate: 29.4 mmol/L — ABNORMAL HIGH (ref 20.0–28.0)
Calcium, Ion: 1.18 mmol/L (ref 1.15–1.40)
HCT: 25 % — ABNORMAL LOW (ref 36.0–46.0)
Hemoglobin: 8.5 g/dL — ABNORMAL LOW (ref 12.0–15.0)
O2 Saturation: 91 %
Potassium: 5.4 mmol/L — ABNORMAL HIGH (ref 3.5–5.1)
Sodium: 138 mmol/L (ref 135–145)
TCO2: 30 mmol/L (ref 22–32)
pCO2 arterial: 36.4 mmHg (ref 32–48)
pH, Arterial: 7.515 — ABNORMAL HIGH (ref 7.35–7.45)
pO2, Arterial: 56 mmHg — ABNORMAL LOW (ref 83–108)

## 2021-12-23 LAB — ETHANOL: Alcohol, Ethyl (B): <10 mg/dL (ref <10)

## 2021-12-23 LAB — TYPE AND SCREEN
ABO/RH(D): O POS
Antibody Screen: NEGATIVE

## 2021-12-23 LAB — GLUCOSE, CAPILLARY: Glucose-Capillary: 136 mg/dL — ABNORMAL HIGH (ref 70–99)

## 2021-12-23 LAB — TROPONIN I (HIGH SENSITIVITY)
Troponin I (High Sensitivity): 39 ng/L — ABNORMAL HIGH (ref <18)
Troponin I (High Sensitivity): 44 ng/L — ABNORMAL HIGH (ref <18)

## 2021-12-23 LAB — LACTIC ACID, PLASMA
Lactic Acid, Venous: 1 mmol/L (ref 0.5–1.9)
Lactic Acid, Venous: 0.9 mmol/L (ref 0.5–1.9)

## 2021-12-23 LAB — LIPASE, BLOOD: Lipase: 45 U/L (ref 11–51)

## 2021-12-23 LAB — HCG, SERUM, QUALITATIVE: Preg, Serum: NEGATIVE

## 2021-12-23 LAB — CBG MONITORING, ED: Glucose-Capillary: 91 mg/dL (ref 70–99)

## 2021-12-23 LAB — CK: Total CK: 33 U/L — ABNORMAL LOW (ref 38–234)

## 2021-12-23 LAB — AMMONIA: Ammonia: 34 umol/L (ref 9–35)

## 2021-12-23 LAB — I-STAT BETA HCG BLOOD, ED (MC, WL, AP ONLY): I-stat hCG, quantitative: 21.1 m[IU]/mL — ABNORMAL HIGH (ref <5)

## 2021-12-23 LAB — TSH: TSH: 4.413 u[IU]/mL (ref 0.350–4.500)

## 2021-12-23 IMAGING — CT CT HEAD W/O CM
4 series · 17 of 47 positions shown, 19 images · non-contrast
Comparison: [DATE].

CLINICAL DATA: Altered mental status.



[Series 3: head wo · axial · 0.39mm/px · z∈[+1505,+1625]mm · 7 of 32 slices shown, 9 images]
[im 4/32  brain]
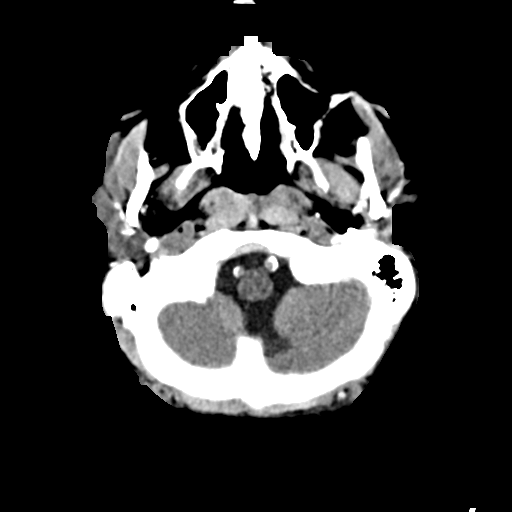
[im 4/32  bone]
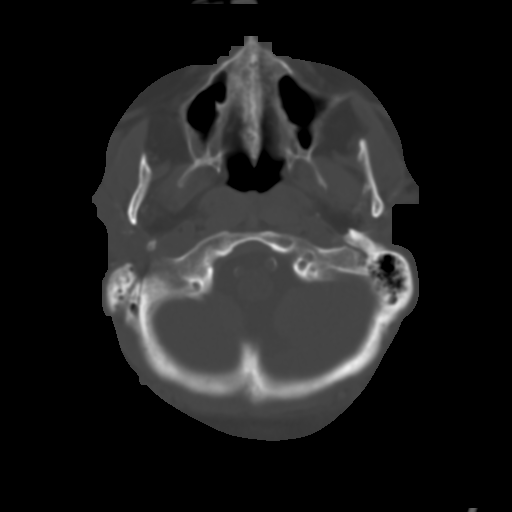
[im 8/32  brain]
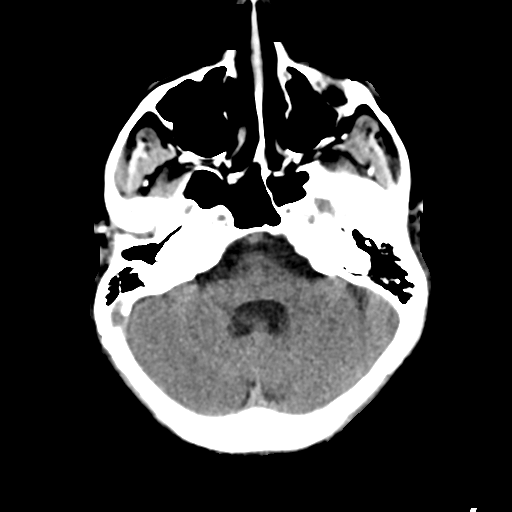
[im 12/32  brain]
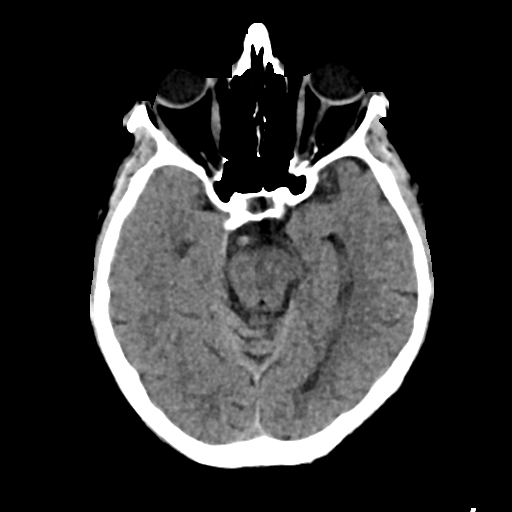
[im 16/32  brain]
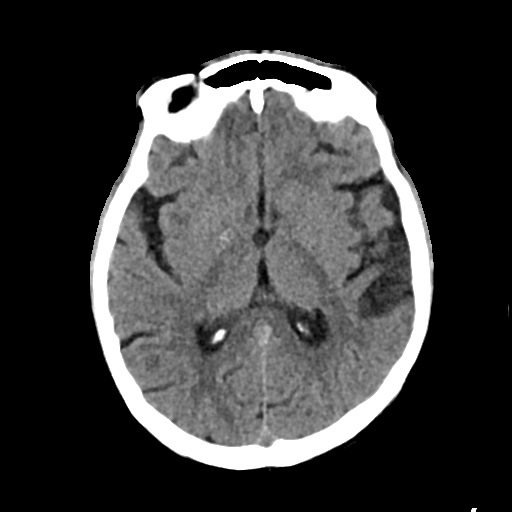
[im 20/32  brain]
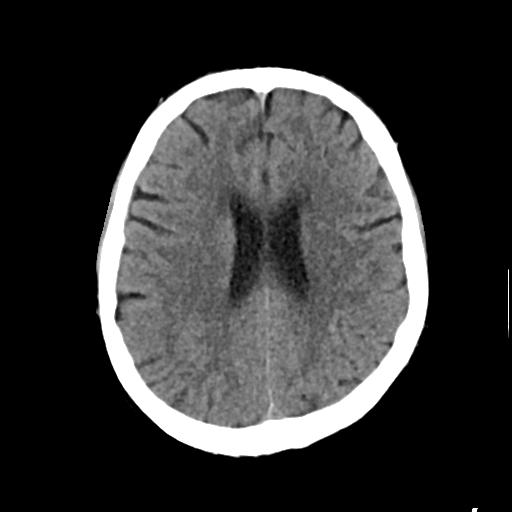
[im 20/32  bone]
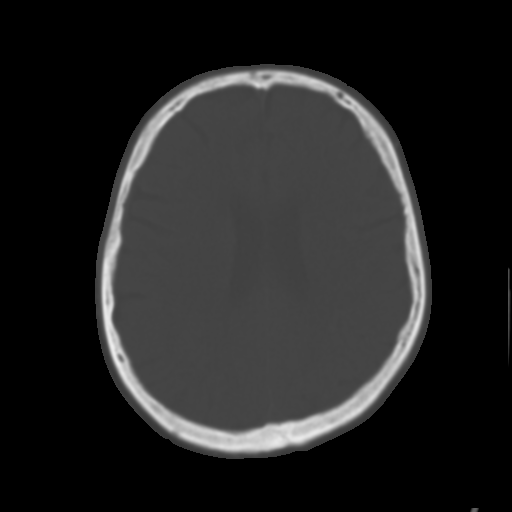
[im 24/32  brain]
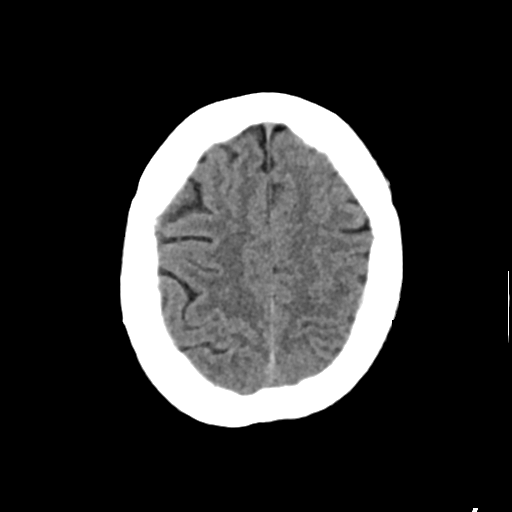
[im 28/32  brain]
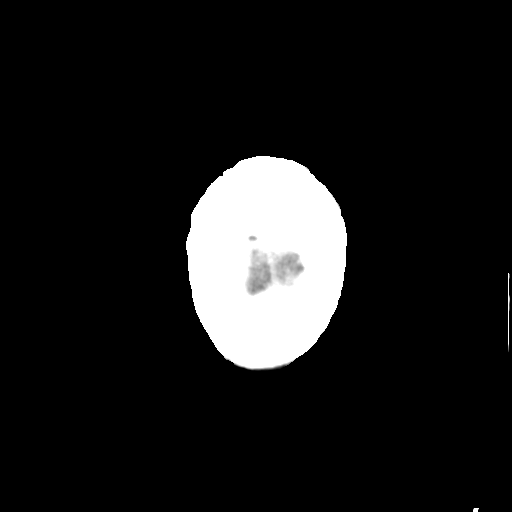

[Series 4: head bone · axial · 0.39mm/px · z∈[+1504,+1560]mm · 4 of 79 slices shown]
[im 8/79  bone]
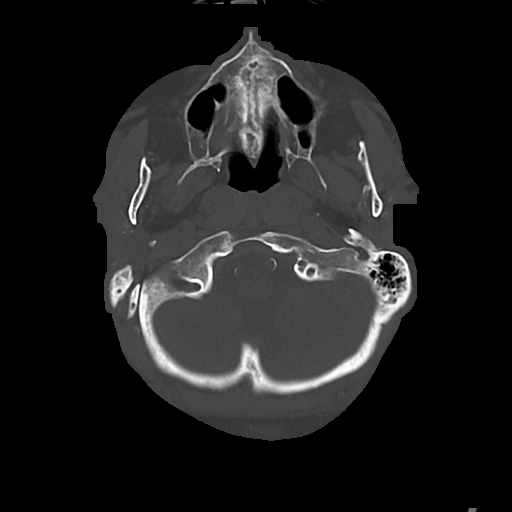
[im 16/79  bone]
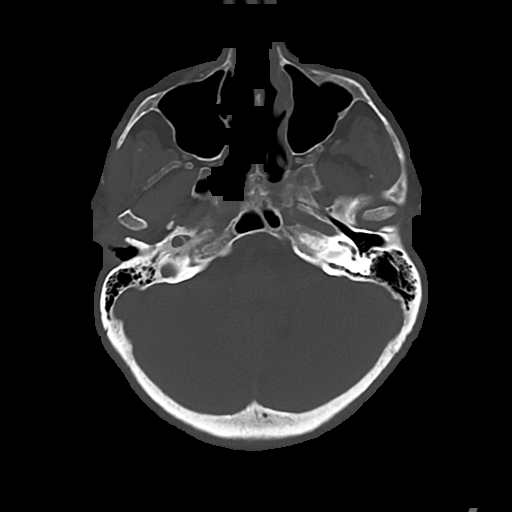
[im 24/79  bone]
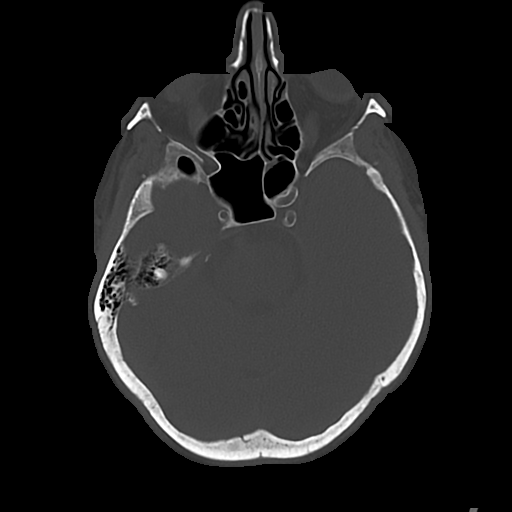
[im 36/79  bone]
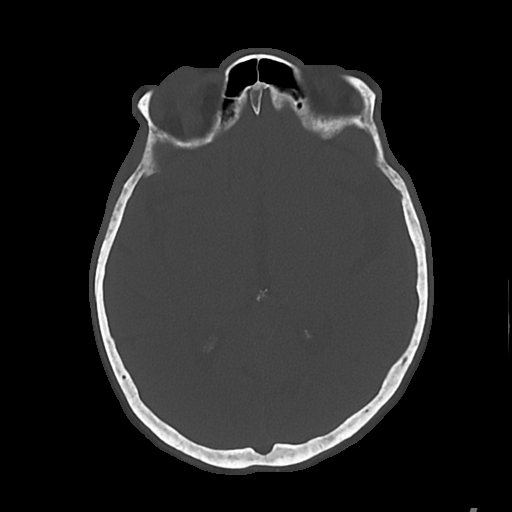

[Series 5: cor soft · coronal · 0.31mm/px · 3 of 64 slices shown]
[im 22/64  brain]
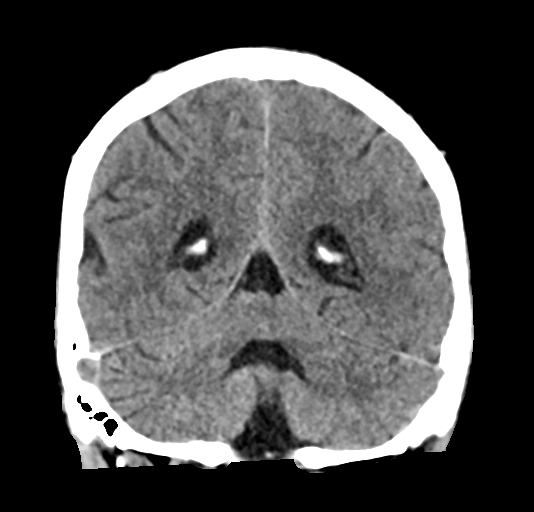
[im 29/64  brain]
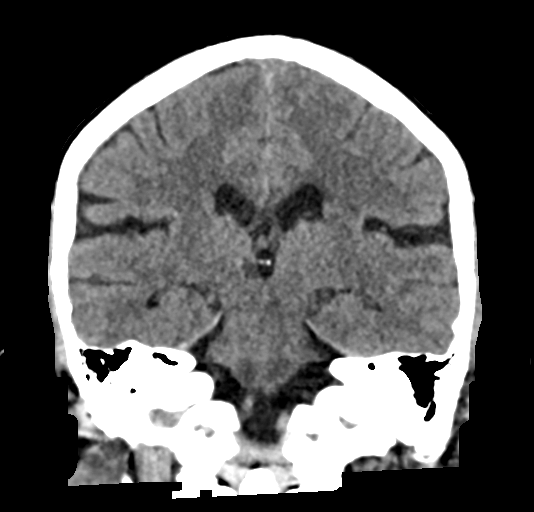
[im 36/64  brain]
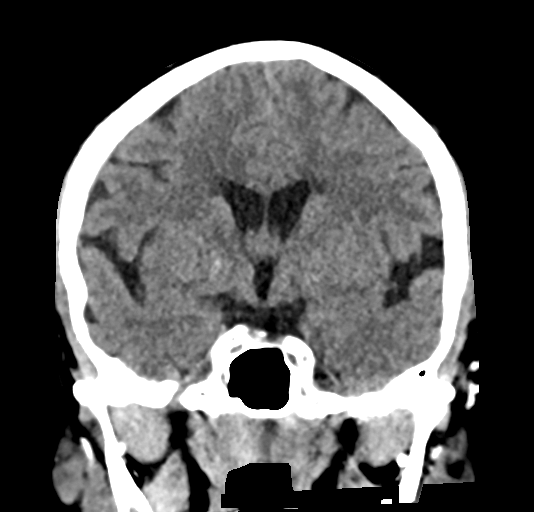

[Series 6: sag soft · sagittal · 0.31mm/px · 3 of 56 slices shown]
[im 19/56  brain]
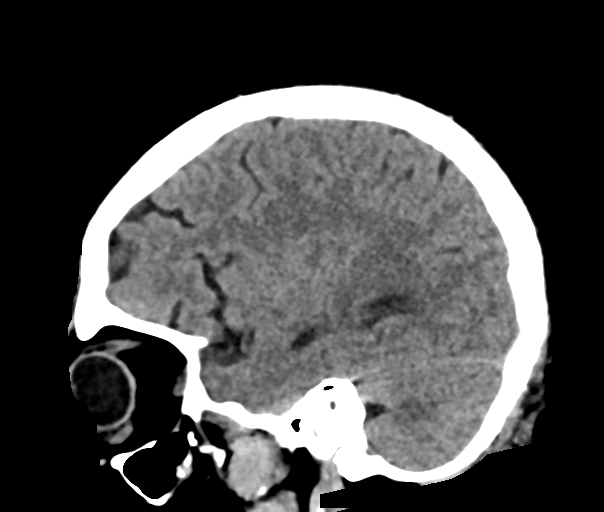
[im 28/56  brain]
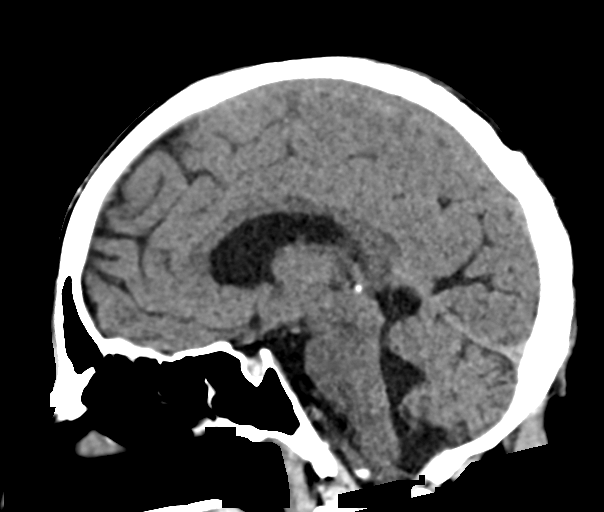
[im 37/56  brain]
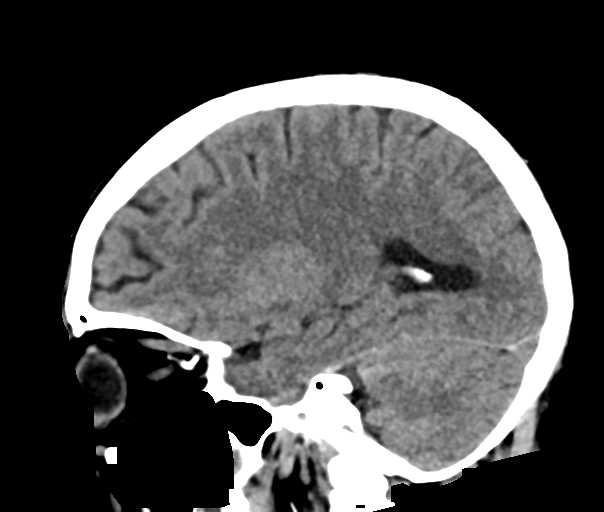

[17 of 47 positions shown; findings below may reference images not displayed]

FINDINGS: Brain: No evidence of acute infarction, hemorrhage, hydrocephalus,
extra-axial collection or mass lesion/mass effect. Subdural hematoma
noted on prior exam has resolved. Old left cerebellar infarction is
again noted.

Vascular: No hyperdense vessel or unexpected calcification.

Skull: Normal. Negative for fracture or focal lesion.

Sinuses/Orbits: No acute finding.

Other: None.
IMPRESSION: No acute intracranial abnormality seen.

## 2021-12-23 IMAGING — DX DG CHEST 1V PORT
1 series · 1 of 1 positions shown · non-contrast
Comparison: [DATE]

CLINICAL DATA: Altered mental status and hypoxia.

EXAM:
PORTABLE CHEST 1 VIEW

[chest]
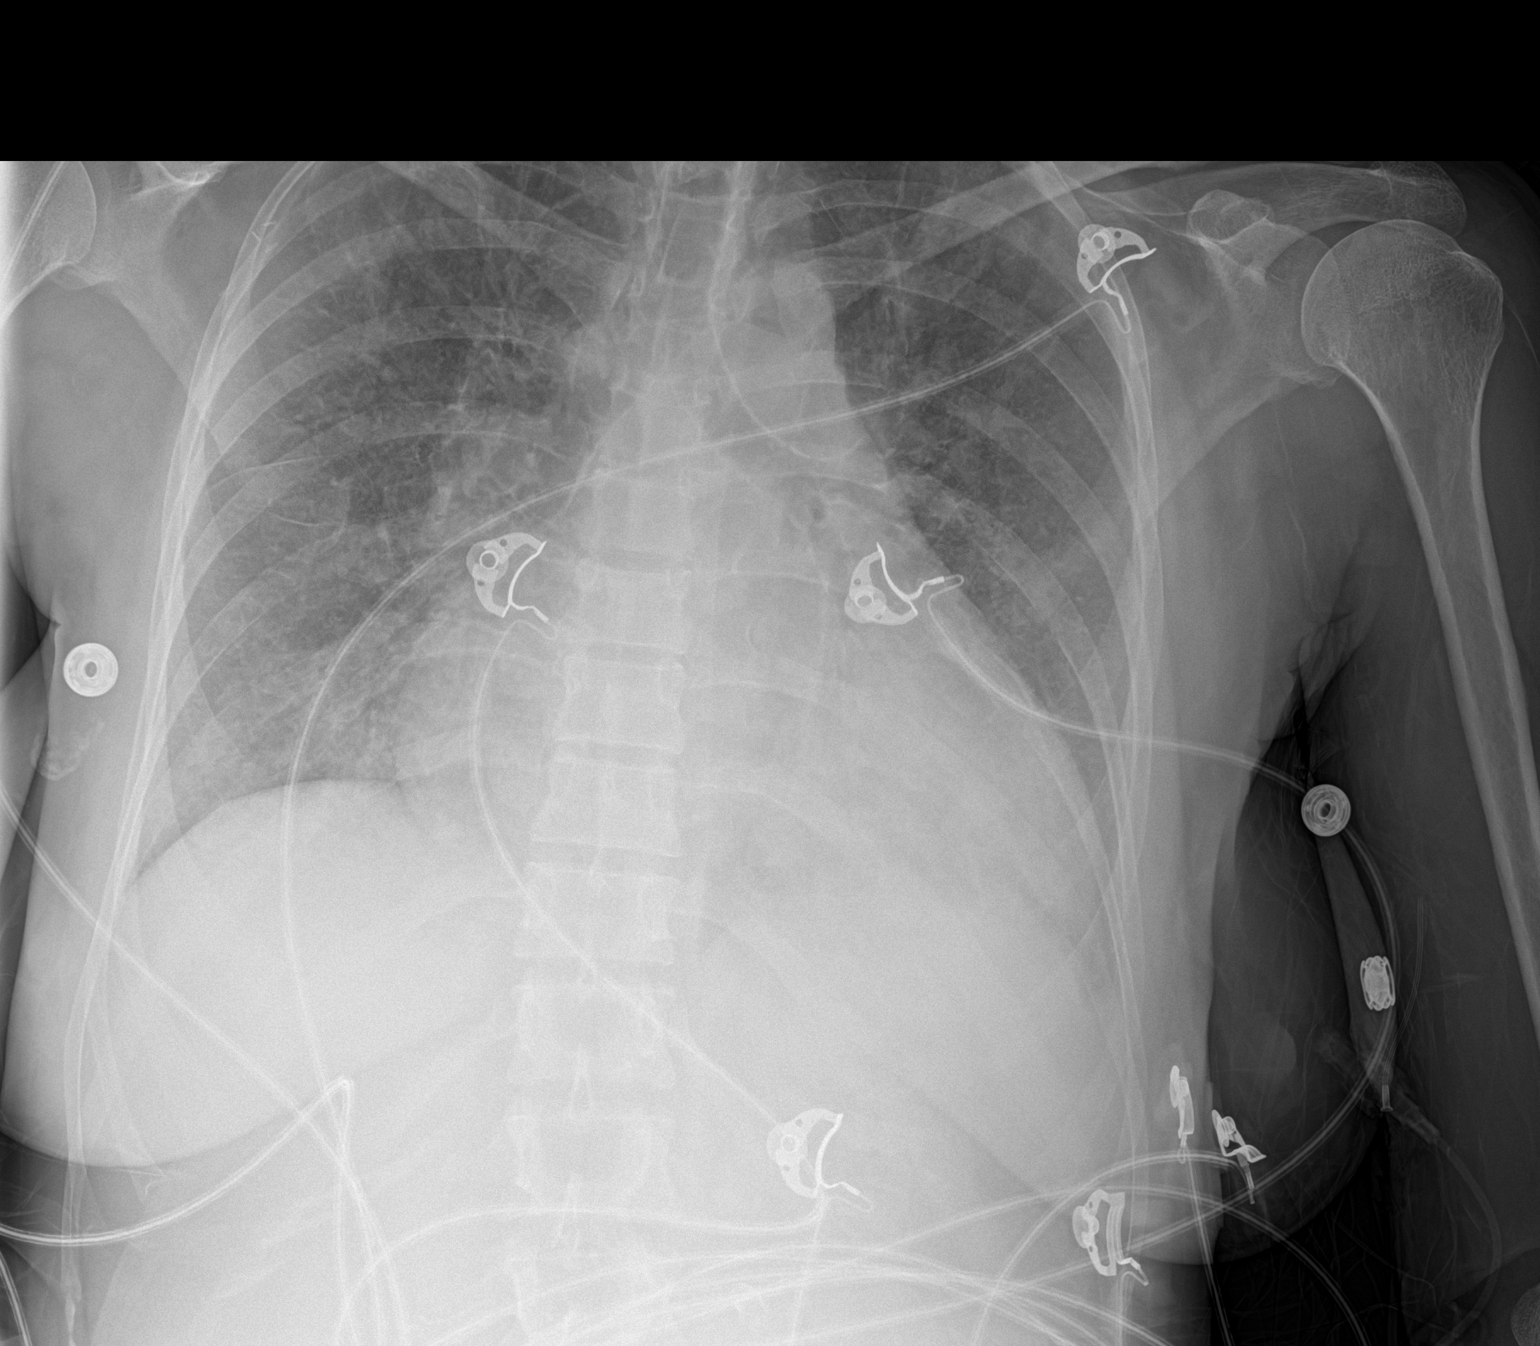

[1 of 1 positions shown; findings below may reference images not displayed]

FINDINGS: Cardiomegaly. Congested appearance of vessels with interstitial and
hazy symmetric pulmonary opacity. There are Kerley lines and trace
left pleural effusion.
IMPRESSION: CHF.

## 2021-12-23 MED ORDER — CLONIDINE HCL 0.1 MG/24HR TD PTWK: 0.1000 mg | MEDICATED_PATCH | TRANSDERMAL | Status: DC

## 2021-12-23 MED ORDER — HEPARIN SODIUM (PORCINE) 5000 UNIT/ML IJ SOLN
5000.0000 [IU] | Freq: Three times a day (TID) | INTRAMUSCULAR | Status: DC
  Administered 2021-12-23 – 2021-12-24 (×4): 5000 [IU] via SUBCUTANEOUS
  Filled 2021-12-23 (×4): qty 1

## 2021-12-23 MED ORDER — METHYLPREDNISOLONE SODIUM SUCC 125 MG IJ SOLR
60.0000 mg | INTRAMUSCULAR | Status: DC
  Administered 2021-12-23 – 2021-12-24 (×2): 60 mg via INTRAVENOUS
  Filled 2021-12-23 (×2): qty 2

## 2021-12-23 MED ORDER — HYDRALAZINE HCL 20 MG/ML IJ SOLN
5.0000 mg | INTRAMUSCULAR | Status: DC | PRN
Start: 2021-12-23 — End: 2021-12-25
  Administered 2021-12-24 (×3): 5 mg via INTRAVENOUS
  Filled 2021-12-23 (×3): qty 1

## 2021-12-23 MED ORDER — HEPARIN SODIUM (PORCINE) 1000 UNIT/ML DIALYSIS: 2000.0000 [IU] | Freq: Once | INTRAMUSCULAR | Status: DC

## 2021-12-23 MED ORDER — ONDANSETRON HCL 4 MG PO TABS
4.0000 mg | ORAL_TABLET | Freq: Four times a day (QID) | ORAL | Status: DC | PRN
  Administered 2021-12-25 (×2): 4 mg via ORAL
  Filled 2021-12-23 (×2): qty 1

## 2021-12-23 MED ORDER — CLONIDINE HCL 0.2 MG/24HR TD PTWK
0.2000 mg | MEDICATED_PATCH | TRANSDERMAL | Status: DC
  Administered 2021-12-23: 0.2 mg via TRANSDERMAL
  Filled 2021-12-23: qty 1

## 2021-12-23 MED ORDER — SODIUM ZIRCONIUM CYCLOSILICATE 10 G PO PACK
10.0000 g | PACK | ORAL | Status: DC
  Administered 2021-12-25: 10 g via ORAL
  Filled 2021-12-23 (×2): qty 1

## 2021-12-23 MED ORDER — METOPROLOL TARTRATE 5 MG/5ML IV SOLN
5.0000 mg | Freq: Four times a day (QID) | INTRAVENOUS | Status: DC
  Administered 2021-12-23 – 2021-12-25 (×7): 5 mg via INTRAVENOUS
  Filled 2021-12-23 (×8): qty 5

## 2021-12-23 MED ORDER — DARBEPOETIN ALFA 60 MCG/0.3ML IJ SOSY
60.0000 ug | PREFILLED_SYRINGE | INTRAMUSCULAR | Status: DC
  Administered 2021-12-23: 60 ug via INTRAVENOUS
  Filled 2021-12-23: qty 0.3

## 2021-12-23 MED ORDER — DOXERCALCIFEROL 4 MCG/2ML IV SOLN
6.0000 ug | INTRAVENOUS | Status: DC
  Administered 2021-12-23: 6 ug via INTRAVENOUS
  Filled 2021-12-23: qty 4

## 2021-12-23 MED ORDER — CYCLOSPORINE MODIFIED (NEORAL) 25 MG PO CAPS
25.0000 mg | ORAL_CAPSULE | Freq: Every day | ORAL | Status: DC
  Filled 2021-12-23 (×2): qty 1

## 2021-12-23 MED ORDER — PANTOPRAZOLE SODIUM 40 MG PO TBEC
40.0000 mg | DELAYED_RELEASE_TABLET | Freq: Every day | ORAL | Status: DC
  Administered 2021-12-24: 40 mg via ORAL
  Filled 2021-12-23 (×2): qty 1

## 2021-12-23 MED ORDER — DOCUSATE SODIUM 283 MG RE ENEM
1.0000 | ENEMA | RECTAL | Status: DC | PRN
  Filled 2021-12-23: qty 1

## 2021-12-23 MED ORDER — HEPARIN SODIUM (PORCINE) 1000 UNIT/ML IJ SOLN: 2000.0000 [IU] | Freq: Once | INTRAMUSCULAR | Status: AC

## 2021-12-23 MED ORDER — CALCIUM CARBONATE ANTACID 1250 MG/5ML PO SUSP
500.0000 mg | Freq: Four times a day (QID) | ORAL | Status: DC | PRN
  Filled 2021-12-23: qty 5

## 2021-12-23 MED ORDER — SODIUM CHLORIDE 0.9% FLUSH
3.0000 mL | Freq: Two times a day (BID) | INTRAVENOUS | Status: DC
  Administered 2021-12-23 – 2021-12-25 (×5): 3 mL via INTRAVENOUS

## 2021-12-23 MED ORDER — ACETAMINOPHEN 650 MG RE SUPP: 650.0000 mg | Freq: Four times a day (QID) | RECTAL | Status: DC | PRN

## 2021-12-23 MED ORDER — SORBITOL 70 % SOLN
30.0000 mL | Status: DC | PRN
  Filled 2021-12-23: qty 30

## 2021-12-23 MED ORDER — ONDANSETRON HCL 4 MG/2ML IJ SOLN
4.0000 mg | Freq: Four times a day (QID) | INTRAMUSCULAR | Status: DC | PRN
Start: 2021-12-23 — End: 2021-12-25
  Administered 2021-12-24: 4 mg via INTRAVENOUS
  Filled 2021-12-23: qty 2

## 2021-12-23 MED ORDER — CHLORHEXIDINE GLUCONATE CLOTH 2 % EX PADS
6.0000 | MEDICATED_PAD | Freq: Every day | CUTANEOUS | Status: DC
  Administered 2021-12-24 – 2021-12-25 (×2): 6 via TOPICAL

## 2021-12-23 MED ORDER — CAMPHOR-MENTHOL 0.5-0.5 % EX LOTN
1.0000 "application " | TOPICAL_LOTION | Freq: Three times a day (TID) | CUTANEOUS | Status: DC | PRN
  Filled 2021-12-23: qty 222

## 2021-12-23 MED ORDER — ACETAMINOPHEN 325 MG PO TABS
650.0000 mg | ORAL_TABLET | Freq: Four times a day (QID) | ORAL | Status: DC | PRN
  Administered 2021-12-24 – 2021-12-25 (×2): 650 mg via ORAL
  Filled 2021-12-23 (×2): qty 2

## 2021-12-23 MED ORDER — FERRIC CITRATE 1 GM 210 MG(FE) PO TABS
630.0000 mg | ORAL_TABLET | Freq: Three times a day (TID) | ORAL | Status: DC
  Administered 2021-12-24 – 2021-12-25 (×2): 630 mg via ORAL
  Filled 2021-12-23 (×7): qty 3

## 2021-12-23 MED ORDER — HYDROXYZINE HCL 25 MG PO TABS: 25.0000 mg | ORAL_TABLET | Freq: Three times a day (TID) | ORAL | Status: DC | PRN

## 2021-12-23 MED ORDER — HEPARIN SODIUM (PORCINE) 1000 UNIT/ML IJ SOLN
INTRAMUSCULAR | Status: AC
  Administered 2021-12-23: 2000 [IU] via INTRAVENOUS
  Filled 2021-12-23: qty 2

## 2021-12-23 MED ORDER — MYCOPHENOLATE SODIUM 180 MG PO TBEC
540.0000 mg | DELAYED_RELEASE_TABLET | Freq: Two times a day (BID) | ORAL | Status: DC
  Administered 2021-12-24 – 2021-12-25 (×2): 540 mg via ORAL
  Filled 2021-12-23 (×3): qty 3

## 2021-12-23 MED ORDER — NEPRO/CARBSTEADY PO LIQD: 237.0000 mL | Freq: Three times a day (TID) | ORAL | Status: DC | PRN

## 2021-12-23 MED ORDER — CYCLOSPORINE MODIFIED (NEORAL) 100 MG PO CAPS
200.0000 mg | ORAL_CAPSULE | Freq: Two times a day (BID) | ORAL | Status: DC
  Administered 2021-12-24 – 2021-12-25 (×2): 200 mg via ORAL
  Filled 2021-12-23 (×3): qty 2

## 2021-12-23 NOTE — Progress Notes (Signed)
Called to HD to get consent for HD tx on this patient. PT is altered and not able to answer questions. This RN called friend listed on chart. [name] [name], states she is the patients caregiver and also POA. Per Gomez Cleverly the husband is not involved. We got consent for HD and told [place] to bring POA papers to hospital. Information was passed down to HD RN.  ? ? ?

## 2021-12-23 NOTE — ED Notes (Signed)
Pt attempted to be in and out cath'd for urine sample. Catheter noted to be in urethra, but no urine at this time. MD Tegeler aware.  ?

## 2021-12-23 NOTE — ED Notes (Signed)
Pt placed on 2 lpm O2 via  per Dr. Sherry Ruffing after reviewing ABG results.  ?

## 2021-12-23 NOTE — ED Triage Notes (Signed)
Pt arrives via [facility] for altered mental status. Pt was found by caregiver this morning not at baseline. LKW two days prior. Pt is MWF dialysis pt. Pt initially hypoxic on RA at 88%. Improved to 96% on [place] ? ?EMS last VS - 190/104, CBG 106, HR 112, 98% on 3 lpm via [place] ?

## 2021-12-23 NOTE — ED Notes (Signed)
Dialysis RN called for report. Report given. Dr. Sherry Ruffing advised to send pt to dialysis now before admission orders in.  ?

## 2021-12-23 NOTE — H&P (Signed)
?History and Physical  ? ? ?Patient: [name] [name] ZES:[id number] DOB: 10-Jan-1981 ?DOA: 12/23/2021 ?DOS: the patient was seen and examined on 12/23/2021 ?PCP: [facility] [place]  ?Patient coming [place]: [place]; [place], [phone] ? ? ?Chief Complaint: AMS ? ?HPI: [name] [name] is a 41 y.o. 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  She was last admitted from 3/30-4/2 for SDH.  Her friend reports the brother called and told her to go check on her - no answer yesterday or today.  Maintenance let her in and she was lying in the bed, unresponsive.  It looked like there was a substance, maybe vomited in her mouth.  She had been describing some pain.  She takes only prescribed meds but might overtake some of the pain meds, "she's always complaining about being in pain."  She has "some scratches on the inside of her stomach."  No ETOH, no illicit substances, no tobacco.   ? ? ? ?ER Course:  HD Wednesday and not seen since.  Found down this AM, very altered.  Marked HTN.  Sats 80s.  CXR with volume overload.  Head CT negative, neurology recommends MRI.   ? ? ? ? ?Review of Systems: unable to review all systems due to the inability of the patient to answer questions. ?Past Medical History:  ?Diagnosis Date  ? Anemia   ? Anxiety   ? ESRD (end stage renal disease) ([place])   ? Henmodialysis  ? Gastroparesis   ? Hypertension   ? Narcotic abuse ([place])   ? Non compliance with medical treatment   ? Renal disorder 2015  ? transplant  ? Vision loss   ? ?Past Surgical History:  ?Procedure Laterality Date  ? A/V FISTULAGRAM Right 02/17/2021  ? Procedure: A/V FISTULAGRAM;  Surgeon: [name], MD;  Location: [facility];  Service: Cardiovascular;  Laterality: Right;  ? AV FISTULA PLACEMENT   11/17/2011  ? Procedure: ARTERIOVENOUS (AV) FISTULA CREATION;  Surgeon: Rosetta Posner, MD;  Location: [address] [address];  Service: Vascular;  Laterality: Right;  ? BIOPSY  12/03/2021  ? Procedure: BIOPSY;  Surgeon: Doran Stabler, MD;  Location: [facility];  Service: Gastroenterology;;  ? ESOPHAGOGASTRODUODENOSCOPY (EGD) WITH PROPOFOL N/A 12/03/2021  ? Procedure: ESOPHAGOGASTRODUODENOSCOPY (EGD) WITH PROPOFOL;  Surgeon: Doran Stabler, MD;  Location: [facility];  Service: Gastroenterology;  Laterality: N/A;  ? EYE SURGERY    ? HEMATOMA EVACUATION Right 02/25/2021  ? Procedure: EVACUATION HEMATOMA RIGHT CHEST;  Surgeon: Waynetta Sandy, MD;  Location: [facility];  Service: Vascular;  Laterality: Right;  ? INSERTION OF DIALYSIS CATHETER Right 12/08/2020  ? Procedure: INSERTION OF TUNNELED DIALYSIS CATHETER;  Surgeon: [name], MD;  Location: [facility];  Service: Vascular;  Laterality: Right;  ? IR REMOVAL TUN CV CATH W/O FL  05/03/2021  ? KIDNEY TRANSPLANT    ? NEPHRECTOMY TRANSPLANTED ORGAN    ? pancrease transplant    ? PERIPHERAL VASCULAR BALLOON ANGIOPLASTY Right 02/17/2021  ? Procedure: PERIPHERAL VASCULAR BALLOON ANGIOPLASTY;  Surgeon: [name], MD;  Location: [facility];  Service: Cardiovascular;  Laterality: Right;  ? REFRACTIVE SURGERY    ? REVISON OF ARTERIOVENOUS FISTULA Right 12/08/2020  ? Procedure: RIGHT ARM ARTERIOVENOUS FISTULA REVISION AND RESECTION;  Surgeon: [name], MD;  Location: [facility]  OR;  Service: Vascular;  Laterality: Right;  ? REVISON OF ARTERIOVENOUS FISTULA Right 02/25/2021  ? Procedure: RIGHT ARM ARTERIOVENOUS FISTULA REVISION WITH TRANSPOSITION OF CEPHALIC VEIN ON AXILLARY VEIN;  Surgeon: [name], MD;  Location: [place];  Service: Vascular;  Laterality: Right;  ? ?Social History:  reports that she has quit smoking. Her smoking use included pipe. She has never used smokeless tobacco. She reports that she does not drink alcohol and does not use  drugs. ? ?Allergies  ?Allergen Reactions  ? Iodinated Contrast Media   ?  Other reaction(s): Unknown  ? ? ?Family History  ?Problem Relation Age of Onset  ? Hypertension Father   ? ? ?Prior to Admission medications   ?Medication Sig Start Date End Date Taking? Authorizing Provider  ?amLODipine (NORVASC) 10 MG tablet Take 1 tablet (10 mg total) by mouth daily. 12/05/21 03/05/22  [place] Eric J, DO  ?AURYXIA 1 GM 210 MG(Fe) tablet Take 630 mg by mouth 3 (three) times daily. 12/09/21   [provider]  ?CATAPRES-TTS-1 0.1 MG/24HR patch Place 1 patch (0.1 mg total) onto the skin once a week. 12/04/21 03/04/22  [place] Eric J, DO  ?cycloSPORINE modified (NEORAL) 100 MG capsule Take 2 capsules (200 mg total) by mouth daily. ?Patient taking differently: Take 200 mg by mouth 2 (two) times daily. 11/17/21   Cherene Altes, MD  ?cycloSPORINE modified (NEORAL) 100 MG capsule Take 1 capsule (100 mg total) by mouth at bedtime. ?Patient not taking: Reported on 12/01/2021 11/17/21   Cherene Altes, MD  ?cycloSPORINE modified (NEORAL) 25 MG capsule Take 25 mg by mouth daily after lunch.    [provider]  ?Darbepoetin Alfa (ARANESP) 60 MCG/0.3ML SOSY injection Inject 0.3 mLs (60 mcg total) into the vein every Friday with hemodialysis. 11/18/21   Cherene Altes, MD  ?diphenhydrAMINE (BENADRYL) 25 MG tablet Take 25 mg by mouth every 6 (six) hours as needed for itching.    [provider]  ?doxercalciferol (HECTOROL) 4 MCG/2ML injection Inject 3 mLs (6 mcg total) into the vein every Monday, Wednesday, and Friday with hemodialysis. 11/17/21   Cherene Altes, MD  ?fluticasone (FLONASE) 50 MCG/ACT nasal spray Place 1 spray into both nostrils daily as needed for allergies. 11/24/21   [provider]  ?lidocaine-prilocaine (EMLA) cream Apply 1 application topically daily as needed (port access). 07/09/20   [provider]  ?losartan (COZAAR) 100 MG tablet Take 1 tablet (100 mg total) by mouth  daily. 12/04/21 03/04/22  [place] Eric J, DO  ?Methoxy PEG-Epoetin Beta (MIRCERA IJ) Mircera 01/03/21 01/02/22  [provider]  ?metoCLOPramide (REGLAN) 5 MG tablet Take 5 mg by mouth every 6 (six) hours as needed for nausea or vomiting. 09/03/21   [provider]  ?mycophenolate (MYFORTIC) 180 MG EC tablet Take 540 mg by mouth 2 (two) times daily.    [provider]  ?naloxone (NARCAN) nasal spray 4 mg/0.1 mL Place 1 spray into the nose as needed (opioid overdose).    [provider]  ?ondansetron (ZOFRAN) 4 MG tablet Take 4 mg by mouth daily as needed for nausea. 12/07/21   [provider]  ?oxyCODONE-acetaminophen (PERCOCET/ROXICET) 5-325 MG tablet Take 1-2 tablets by mouth every 6 (six) hours as needed for severe pain. ?Patient taking differently: Take 1 tablet by mouth 3 (three) times daily as needed for moderate pain. 03/22/21   [name], MD  ?pantoprazole (PROTONIX) 40 MG tablet Take 1 tablet (40 mg total)  by mouth daily. 12/04/21 03/04/22  [place] Eric J, DO  ?predniSONE (DELTASONE) 5 MG tablet Take 5 mg by mouth in the morning, at noon, and at bedtime. 06/24/15   [provider]  ?sodium zirconium cyclosilicate (LOKELMA) 10 g PACK packet Take 10 g by mouth as directed. Take on Non-Dialysis (Sun,Tues, Thurs, Sat)    [provider]  ?zolpidem (AMBIEN) 10 MG tablet Take 10 mg by mouth at bedtime as needed for sleep.    [provider]  ? ? ?Physical Exam: ?Vitals:  ? 12/23/21 1341 12/23/21 1400 12/23/21 1430 12/23/21 1500  ?BP: (!) 187/64 (!) 202/66 (!) 198/71 (!) 194/70  ?Pulse: (!) 114 (!) 112 (!) 112 (!) 112  ?Resp: '15 20 20 '$ (!) 22  ?Temp:      ?TempSrc:      ?SpO2: 99% 99% 100% 100%  ? ?General:  Appears obtunded, only flinches with pain but otherwise does not follow commands or attempt to communicate ?Eyes:  normal lids, tightly closes eyelids when manually attempting to open ?ENT:  grossly normal lips & tongue, mildly dry mm ?Neck:   no LAD, masses or thyromegaly ?Cardiovascular:  RR with tachycardia, no m/r/g. No LE edema.  ?Respiratory:   CTA bilaterally with no wheezes/rales/rhonchi.  Normal respiratory effort. ?Abdomen:  soft, NT, ND ?Sk

## 2021-12-23 NOTE — Consult Note (Addendum)
Renal Service ?Consult Note ?[name] ? ?[name] [name] ?12/23/2021 ?[name], MD ?Requesting Physician: Dr. Sherry Ruffing ? ?Reason for Consult: ESRD pt w/ recurrent AMS ?HPI: The patient is a 41 y.o. year-old w/ h xof anxiety, HTN, ESRD on HD, vision loss who presents to ED today for AMS. She was found by caregiver this am confused. Last seen 2 days prior. MWF HD pt, last HD Wed. Borderline hypoxic. CXR w/ +edema. Asked to see for HD.  ? ?Pt seen in HD unit. She is very lethargic. No hx obtained.  ? ?ROS - denies CP, no joint pain, no HA, no blurry vision, no rash, no diarrhea, no nausea/ vomiting ? ?Past Medical History  ?Past Medical History:  ?Diagnosis Date  ? Anemia   ? Anxiety   ? ESRD (end stage renal disease) ([place])   ? Henmodialysis  ? Gastroparesis   ? Hypertension   ? Narcotic abuse ([place])   ? Non compliance with medical treatment   ? Renal disorder 2015  ? transplant  ? Vision loss   ? ?Past Surgical History  ?Past Surgical History:  ?Procedure Laterality Date  ? A/V FISTULAGRAM Right 02/17/2021  ? Procedure: A/V FISTULAGRAM;  Surgeon: [name], MD;  Location: [facility];  Service: Cardiovascular;  Laterality: Right;  ? AV FISTULA PLACEMENT  11/17/2011  ? Procedure: ARTERIOVENOUS (AV) FISTULA CREATION;  Surgeon: Rosetta Posner, MD;  Location: [place];  Service: Vascular;  Laterality: Right;  ? BIOPSY  12/03/2021  ? Procedure: BIOPSY;  Surgeon: Doran Stabler, MD;  Location: [place];  Service: Gastroenterology;;  ? ESOPHAGOGASTRODUODENOSCOPY (EGD) WITH PROPOFOL N/A 12/03/2021  ? Procedure: ESOPHAGOGASTRODUODENOSCOPY (EGD) WITH PROPOFOL;  Surgeon: Doran Stabler, MD;  Location: [place];  Service: Gastroenterology;  Laterality: N/A;  ? EYE SURGERY    ? HEMATOMA EVACUATION Right 02/25/2021  ? Procedure: EVACUATION HEMATOMA RIGHT CHEST;  Surgeon: Waynetta Sandy, MD;  Location: [place];  Service: Vascular;  Laterality: Right;  ? INSERTION OF DIALYSIS  CATHETER Right 12/08/2020  ? Procedure: INSERTION OF TUNNELED DIALYSIS CATHETER;  Surgeon: [name], MD;  [facility];  Service: Vascular;  Laterality: Right;  ? IR REMOVAL TUN CV CATH W/O FL  05/03/2021  ? KIDNEY TRANSPLANT    ? NEPHRECTOMY TRANSPLANTED ORGAN    ? pancrease transplant    ? PERIPHERAL VASCULAR BALLOON ANGIOPLASTY Right 02/17/2021  ? Procedure: PERIPHERAL VASCULAR BALLOON ANGIOPLASTY;  Surgeon: [name], MD;  Location: [place] [facility];  Service: Cardiovascular;  Laterality: Right;  ? REFRACTIVE SURGERY    ? REVISON OF ARTERIOVENOUS FISTULA Right 12/08/2020  ? Procedure: RIGHT ARM ARTERIOVENOUS FISTULA REVISION AND RESECTION;  Surgeon: [name], MD;  Location: [place];  Service: Vascular;  Laterality: Right;  ? REVISON OF ARTERIOVENOUS FISTULA Right 02/25/2021  ? Procedure: RIGHT ARM ARTERIOVENOUS FISTULA REVISION WITH TRANSPOSITION OF CEPHALIC VEIN ON AXILLARY VEIN;  Surgeon: [name], MD;  Location: [place];  Service: Vascular;  Laterality: Right;  ? ?Family History  ?Family History  ?Problem Relation Age of Onset  ? Hypertension Father   ? ?Social History  reports that she has quit smoking. Her smoking use included pipe. She has never used smokeless tobacco. She reports that she does not drink alcohol and does not use drugs. ?Allergies  ?Allergies  ?Allergen Reactions  ? Iodinated Contrast Media   ?  Other reaction(s): Unknown  ? ?Home medications ?Prior to Admission  medications   ?Medication Sig Start Date End Date Taking? Authorizing Provider  ?amLODipine (NORVASC) 10 MG tablet Take 1 tablet (10 mg total) by mouth daily. 12/05/21 03/05/22  [place] Eric J, DO  ?AURYXIA 1 GM 210 MG(Fe) tablet Take 630 mg by mouth 3 (three) times daily. 12/09/21   [provider]  ?CATAPRES-TTS-1 0.1 MG/24HR patch Place 1 patch (0.1 mg total) onto the skin once a week. 12/04/21 03/04/22  [place] Eric J, DO  ?cycloSPORINE modified (NEORAL) 100 MG capsule Take 2 capsules  (200 mg total) by mouth daily. ?Patient taking differently: Take 200 mg by mouth 2 (two) times daily. 11/17/21   Cherene Altes, MD  ?cycloSPORINE modified (NEORAL) 100 MG capsule Take 1 capsule (100 mg total) by mouth at bedtime. ?Patient not taking: Reported on 12/01/2021 11/17/21   Cherene Altes, MD  ?cycloSPORINE modified (NEORAL) 25 MG capsule Take 25 mg by mouth daily after lunch.    [provider]  ?Darbepoetin Alfa (ARANESP) 60 MCG/0.3ML SOSY injection Inject 0.3 mLs (60 mcg total) into the vein every Friday with hemodialysis. 11/18/21   Cherene Altes, MD  ?diphenhydrAMINE (BENADRYL) 25 MG tablet Take 25 mg by mouth every 6 (six) hours as needed for itching.    [provider]  ?doxercalciferol (HECTOROL) 4 MCG/2ML injection Inject 3 mLs (6 mcg total) into the vein every Monday, Wednesday, and Friday with hemodialysis. 11/17/21   Cherene Altes, MD  ?fluticasone (FLONASE) 50 MCG/ACT nasal spray Place 1 spray into both nostrils daily as needed for allergies. 11/24/21   [provider]  ?lidocaine-prilocaine (EMLA) cream Apply 1 application topically daily as needed (port access). 07/09/20   [provider]  ?losartan (COZAAR) 100 MG tablet Take 1 tablet (100 mg total) by mouth daily. 12/04/21 03/04/22  [place] Eric J, DO  ?Methoxy PEG-Epoetin Beta (MIRCERA IJ) Mircera 01/03/21 01/02/22  [provider]  ?metoCLOPramide (REGLAN) 5 MG tablet Take 5 mg by mouth every 6 (six) hours as needed for nausea or vomiting. 09/03/21   [provider]  ?mycophenolate (MYFORTIC) 180 MG EC tablet Take 540 mg by mouth 2 (two) times daily.    [provider]  ?naloxone (NARCAN) nasal spray 4 mg/0.1 mL Place 1 spray into the nose as needed (opioid overdose).    [provider]  ?ondansetron (ZOFRAN) 4 MG tablet Take 4 mg by mouth daily as needed for nausea. 12/07/21   [provider]  ?oxyCODONE-acetaminophen (PERCOCET/ROXICET) 5-325 MG tablet  Take 1-2 tablets by mouth every 6 (six) hours as needed for severe pain. ?Patient taking differently: Take 1 tablet by mouth 3 (three) times daily as needed for moderate pain. 03/22/21   [name], MD  ?pantoprazole (PROTONIX) 40 MG tablet Take 1 tablet (40 mg total) by mouth daily. 12/04/21 03/04/22  [place] Eric J, DO  ?predniSONE (DELTASONE) 5 MG tablet Take 5 mg by mouth in the morning, at noon, and at bedtime. 06/24/15   [provider]  ?sodium zirconium cyclosilicate (LOKELMA) 10 g PACK packet Take 10 g by mouth as directed. Take on Non-Dialysis (Sun,Tues, Thurs, Sat)    [provider]  ?zolpidem (AMBIEN) 10 MG tablet Take 10 mg by mouth at bedtime as needed for sleep.    [provider]  ? ? ? ?Vitals:  ? 12/23/21 1115 12/23/21 1130 12/23/21 1145 12/23/21 1200  ?BP: (!) 190/83 (!) 184/73 (!) 172/86 (!) 183/82  ?Pulse: (!) 115 (!) 113 (!) 110 (!) 110  ?  Resp: '12 20 17 19  '$ ?Temp:      ?TempSrc:      ?SpO2: 100% 99% 99% 99%  ? ?Exam ?Gen is somnolent, will withdraw but not responding verbally ?No rash, cyanosis or gangrene ?Sclera anicteric ?No jvd or bruits ?Chest mild bibasilar crackles, no wheezing ?RRR no MRG, pounding PMI ?Abd soft ntnd no mass or ascites +bs ?MS no joint effusions or deformity ?Ext no sig LE or UE edema, no wounds or ulcers ?Neuro somnolent, not responsive ?   ? ? Home meds include - norvasc 10, auryxia 3 ac, catapres patch 0.1, cyclosporine, losartan 100, reglan 5 qid, myfortic 540 bid, percocet, protonix, pred 5 mg tid, lokelma non HD days, ambien, prns/ vits / supps ? ? CXR 4/21 - FINDINGS: Cardiomegaly. Congested appearance of vessels with interstitial and hazy symmetric pulmonary opacity. There are [employer] and trace ?left pleural effusion.  ?   ? OP HD: [facility] MWF ? From march 2023 > 3h 19mn  400/500   2/2 bath  RUE AVF  15g  Hep 2000 ? - 4/02 > hep B Ag was neg and hep B Abs were > 10 (= protective)  ? ? ?Assessment/ Plan: ?AMS - here last month  w/ similar issues.  ?ESRD - on HD MWF. HD today in progress.  ?AHRF - due to vol overload w/ pulm edema on CXR. Max UF w/ HD today.  ?HTN - on norvasc, catapres patch, losartan at home. Will start catapres

## 2021-12-23 NOTE — ED Provider Notes (Signed)
?[facility] ?Provider Note ? ? ?CSN: [id number] ?Arrival date & time: 12/23/21  3295 ? ?  ? ?History ? ?Chief Complaint  ?Patient presents with  ? Altered Mental Status  ? ? ?[name] [name] is a 41 y.o. female. ? ?The history is provided by the EMS personnel and medical records. The history is limited by the condition of the patient.  ?Altered Mental Status ?Presenting symptoms: confusion and partial responsiveness   ?Severity:  Severe ?Episode history:  Continuous ?Timing:  Constant ?Progression:  Unchanged ?Chronicity:  Recurrent ?Associated symptoms: light-headedness   ?Associated symptoms: no abdominal pain, no agitation, no fever, no headaches, no nausea, no palpitations, no rash and no vomiting   ? ?LVL 5 caveat for AMS ? ?  ? ?Home Medications ?Prior to Admission medications   ?Medication Sig Start Date End Date Taking? Authorizing Provider  ?amLODipine (NORVASC) 10 MG tablet Take 1 tablet (10 mg total) by mouth daily. 12/05/21 03/05/22  [place] Donnamarie Poag, DO  ?CATAPRES-TTS-1 0.1 MG/24HR patch Place 1 patch (0.1 mg total) onto the skin once a week. 12/04/21 03/04/22  [place] Eric J, DO  ?cycloSPORINE modified (NEORAL) 100 MG capsule Take 2 capsules (200 mg total) by mouth daily. ?Patient taking differently: Take 200 mg by mouth 2 (two) times daily. 11/17/21   Cherene Altes, MD  ?cycloSPORINE modified (NEORAL) 100 MG capsule Take 1 capsule (100 mg total) by mouth at bedtime. ?Patient not taking: Reported on 12/01/2021 11/17/21   Cherene Altes, MD  ?cycloSPORINE modified (NEORAL) 25 MG capsule Take 25 mg by mouth daily after lunch.    [provider]  ?Darbepoetin Alfa (ARANESP) 60 MCG/0.3ML SOSY injection Inject 0.3 mLs (60 mcg total) into the vein every Friday with hemodialysis. 11/18/21   Cherene Altes, MD  ?diphenhydrAMINE (BENADRYL) 25 MG tablet Take 25 mg by mouth every 6 (six) hours as needed for itching.    [provider]  ?doxercalciferol  (HECTOROL) 4 MCG/2ML injection Inject 3 mLs (6 mcg total) into the vein every Monday, Wednesday, and Friday with hemodialysis. 11/17/21   Cherene Altes, MD  ?fluticasone (FLONASE) 50 MCG/ACT nasal spray Place 1 spray into both nostrils daily as needed for allergies. 11/24/21   [provider]  ?lidocaine-prilocaine (EMLA) cream Apply 1 application topically daily as needed (port access). 07/09/20   [provider]  ?losartan (COZAAR) 100 MG tablet Take 1 tablet (100 mg total) by mouth daily. 12/04/21 03/04/22  [place] Eric J, DO  ?Methoxy PEG-Epoetin Beta (MIRCERA IJ) Mircera 01/03/21 01/02/22  [provider]  ?metoCLOPramide (REGLAN) 5 MG tablet Take 5 mg by mouth every 6 (six) hours as needed for nausea or vomiting. 09/03/21   [provider]  ?mycophenolate (MYFORTIC) 180 MG EC tablet Take 540 mg by mouth 2 (two) times daily.    [provider]  ?naloxone (NARCAN) nasal spray 4 mg/0.1 mL Place 1 spray into the nose as needed (opioid overdose).    [provider]  ?oxyCODONE-acetaminophen (PERCOCET/ROXICET) 5-325 MG tablet Take 1-2 tablets by mouth every 6 (six) hours as needed for severe pain. ?Patient taking differently: Take 1 tablet by mouth 3 (three) times daily as needed for moderate pain. 03/22/21   [name], MD  ?pantoprazole (PROTONIX) 40 MG tablet Take 1 tablet (40 mg total) by mouth daily. 12/04/21 03/04/22  [place] Eric J, DO  ?predniSONE (DELTASONE) 5 MG tablet Take 5 mg by mouth in the morning, at noon, and  at bedtime. 06/24/15   [provider]  ?sodium zirconium cyclosilicate (LOKELMA) 10 g PACK packet Take 10 g by mouth as directed. Take on Non-Dialysis (Sun,Tues, Thurs, Sat)    [provider]  ?zolpidem (AMBIEN) 10 MG tablet Take 10 mg by mouth at bedtime as needed for sleep.    [provider]  ?   ? ?Allergies    ?Iodinated contrast media   ? ?Review of Systems   ?Review of Systems  ?Constitutional:  Positive  for fatigue. Negative for chills and fever.  ?HENT:  Negative for congestion.   ?Respiratory:  Negative for cough, chest tightness, shortness of breath and wheezing.   ?Cardiovascular:  Negative for chest pain, palpitations and leg swelling.  ?Gastrointestinal:  Negative for abdominal pain, constipation, diarrhea, nausea and vomiting.  ?Musculoskeletal:  Negative for back pain.  ?Skin:  Negative for rash and wound.  ?Neurological:  Positive for light-headedness. Negative for headaches.  ?Psychiatric/Behavioral:  Positive for confusion. Negative for agitation.   ?All other systems reviewed and are negative. ? ?Physical Exam ?Updated Vital Signs ?There were no vitals taken for this visit. ?Physical Exam ?Vitals and nursing note reviewed.  ?Constitutional:   ?   General: She is not in acute distress. ?   Appearance: She is well-developed. She is not ill-appearing, toxic-appearing or diaphoretic.  ?HENT:  ?   Head: Normocephalic and atraumatic.  ?   Nose: No congestion or rhinorrhea.  ?   Mouth/Throat:  ?   Mouth: Mucous membranes are moist.  ?Eyes:  ?   Conjunctiva/sclera: Conjunctivae normal.  ?Cardiovascular:  ?   Rate and Rhythm: Regular rhythm. Tachycardia present.  ?   Pulses: Normal pulses.  ?   Heart sounds: No murmur heard. ?Pulmonary:  ?   Effort: Pulmonary effort is normal. No respiratory distress.  ?   Breath sounds: Rhonchi and rales present. No wheezing.  ?Chest:  ?   Chest wall: No tenderness.  ?Abdominal:  ?   General: Abdomen is flat.  ?   Palpations: Abdomen is soft.  ?   Tenderness: There is no abdominal tenderness. There is no guarding or rebound.  ?Musculoskeletal:     ?   General: No swelling or tenderness.  ?   Cervical back: Neck supple. No tenderness.  ?Skin: ?   General: Skin is warm and dry.  ?   Capillary Refill: Capillary refill takes less than 2 seconds.  ?   Findings: No erythema.  ?Neurological:  ?   Mental Status: She is alert.  ?   GCS: GCS eye subscore is 2. GCS verbal subscore is 2.  GCS motor subscore is 5.  ?   Cranial Nerves: No dysarthria.  ?   Motor: No tremor or seizure activity.  ? ? ?ED Results / Procedures / Treatments   ?Labs ?(all labs ordered are listed, but only abnormal results are displayed) ?Labs Reviewed  ?COMPREHENSIVE METABOLIC PANEL - Abnormal; Notable for the following components:  ?    Result Value  ? Potassium 5.9 (*)   ? BUN 41 (*)   ? Creatinine, Ser 7.23 (*)   ? GFR, Estimated 7 (*)   ? Anion gap 17 (*)   ? All other components within normal limits  ?CK - Abnormal; Notable for the following components:  ? Total CK 33 (*)   ? All other components within normal limits  ?CBC WITH DIFFERENTIAL/PLATELET - Abnormal; Notable for the following components:  ? RBC 2.88 (*)   ?  Hemoglobin 8.8 (*)   ? HCT 27.3 (*)   ? All other components within normal limits  ?I-STAT BETA HCG BLOOD, ED ([facility], [facility], [facility] ONLY) - Abnormal; Notable for the following components:  ? I-stat hCG, quantitative 21.1 (*)   ? All other components within normal limits  ?I-STAT CHEM 8, ED - Abnormal; Notable for the following components:  ? Potassium 5.5 (*)   ? BUN 43 (*)   ? Creatinine, Ser 8.30 (*)   ? Calcium, Ion 1.02 (*)   ? Hemoglobin 10.2 (*)   ? HCT 30.0 (*)   ? All other components within normal limits  ?I-STAT ARTERIAL BLOOD GAS, ED - Abnormal; Notable for the following components:  ? pH, Arterial 7.515 (*)   ? pO2, Arterial 56 (*)   ? Bicarbonate 29.4 (*)   ? Acid-Base Excess 6.0 (*)   ? Potassium 5.4 (*)   ? HCT 25.0 (*)   ? Hemoglobin 8.5 (*)   ? All other components within normal limits  ?TROPONIN I (HIGH SENSITIVITY) - Abnormal; Notable for the following components:  ? Troponin I (High Sensitivity) 39 (*)   ? All other components within normal limits  ?TROPONIN I (HIGH SENSITIVITY) - Abnormal; Notable for the following components:  ? Troponin I (High Sensitivity) 44 (*)   ? All other components within normal limits  ?CULTURE, BLOOD (ROUTINE X 2)  ?CULTURE, BLOOD (ROUTINE X 2)  ?URINE CULTURE  ?LACTIC  ACID, PLASMA  ?LACTIC ACID, PLASMA  ?LIPASE, BLOOD  ?AMMONIA  ?TSH  ?ETHANOL  ?HCG, SERUM, QUALITATIVE  ?CBC WITH DIFFERENTIAL/PLATELET  ?URINALYSIS, ROUTINE W REFLEX MICROSCOPIC  ?RAPID URINE DRUG SCR

## 2021-12-24 ENCOUNTER — Observation Stay (HOSPITAL_COMMUNITY): Payer: Medicare Other

## 2021-12-24 DIAGNOSIS — Z940 Kidney transplant status: Secondary | ICD-10-CM

## 2021-12-24 DIAGNOSIS — J9601 Acute respiratory failure with hypoxia: Secondary | ICD-10-CM | POA: Diagnosis present

## 2021-12-24 DIAGNOSIS — Z79899 Other long term (current) drug therapy: Secondary | ICD-10-CM | POA: Diagnosis not present

## 2021-12-24 DIAGNOSIS — F1120 Opioid dependence, uncomplicated: Secondary | ICD-10-CM

## 2021-12-24 DIAGNOSIS — N186 End stage renal disease: Secondary | ICD-10-CM | POA: Diagnosis present

## 2021-12-24 DIAGNOSIS — Z91199 Patient's noncompliance with other medical treatment and regimen due to unspecified reason: Secondary | ICD-10-CM | POA: Diagnosis not present

## 2021-12-24 DIAGNOSIS — M898X9 Other specified disorders of bone, unspecified site: Secondary | ICD-10-CM | POA: Diagnosis present

## 2021-12-24 DIAGNOSIS — Y830 Surgical operation with transplant of whole organ as the cause of abnormal reaction of the patient, or of later complication, without mention of misadventure at the time of the procedure: Secondary | ICD-10-CM | POA: Diagnosis present

## 2021-12-24 DIAGNOSIS — G9341 Metabolic encephalopathy: Secondary | ICD-10-CM | POA: Diagnosis present

## 2021-12-24 DIAGNOSIS — N289 Disorder of kidney and ureter, unspecified: Secondary | ICD-10-CM

## 2021-12-24 DIAGNOSIS — Z992 Dependence on renal dialysis: Secondary | ICD-10-CM

## 2021-12-24 DIAGNOSIS — G8929 Other chronic pain: Secondary | ICD-10-CM | POA: Diagnosis present

## 2021-12-24 DIAGNOSIS — E8770 Fluid overload, unspecified: Secondary | ICD-10-CM

## 2021-12-24 DIAGNOSIS — I10 Essential (primary) hypertension: Secondary | ICD-10-CM | POA: Diagnosis not present

## 2021-12-24 DIAGNOSIS — T402X1A Poisoning by other opioids, accidental (unintentional), initial encounter: Secondary | ICD-10-CM | POA: Diagnosis present

## 2021-12-24 DIAGNOSIS — T8612 Kidney transplant failure: Secondary | ICD-10-CM | POA: Diagnosis present

## 2021-12-24 DIAGNOSIS — Z87891 Personal history of nicotine dependence: Secondary | ICD-10-CM | POA: Diagnosis not present

## 2021-12-24 DIAGNOSIS — Z7951 Long term (current) use of inhaled steroids: Secondary | ICD-10-CM | POA: Diagnosis not present

## 2021-12-24 DIAGNOSIS — G4700 Insomnia, unspecified: Secondary | ICD-10-CM | POA: Diagnosis present

## 2021-12-24 DIAGNOSIS — Z8249 Family history of ischemic heart disease and other diseases of the circulatory system: Secondary | ICD-10-CM | POA: Diagnosis not present

## 2021-12-24 DIAGNOSIS — G9340 Encephalopathy, unspecified: Secondary | ICD-10-CM | POA: Diagnosis not present

## 2021-12-24 DIAGNOSIS — R4182 Altered mental status, unspecified: Secondary | ICD-10-CM | POA: Diagnosis present

## 2021-12-24 DIAGNOSIS — Z8616 Personal history of COVID-19: Secondary | ICD-10-CM | POA: Diagnosis not present

## 2021-12-24 DIAGNOSIS — D631 Anemia in chronic kidney disease: Secondary | ICD-10-CM | POA: Diagnosis present

## 2021-12-24 DIAGNOSIS — Z91041 Radiographic dye allergy status: Secondary | ICD-10-CM | POA: Diagnosis not present

## 2021-12-24 DIAGNOSIS — Z9483 Pancreas transplant status: Secondary | ICD-10-CM | POA: Diagnosis not present

## 2021-12-24 DIAGNOSIS — I160 Hypertensive urgency: Secondary | ICD-10-CM | POA: Diagnosis present

## 2021-12-24 LAB — CBC WITH DIFFERENTIAL/PLATELET
Abs Immature Granulocytes: 0.04 10*3/uL (ref 0.00–0.07)
Basophils Absolute: 0 10*3/uL (ref 0.0–0.1)
Basophils Relative: 0 %
Eosinophils Absolute: 0 10*3/uL (ref 0.0–0.5)
Eosinophils Relative: 0 %
HCT: 26.5 % — ABNORMAL LOW (ref 36.0–46.0)
Hemoglobin: 8.8 g/dL — ABNORMAL LOW (ref 12.0–15.0)
Immature Granulocytes: 1 %
Lymphocytes Relative: 19 %
Lymphs Abs: 1 10*3/uL (ref 0.7–4.0)
MCH: 30.8 pg (ref 26.0–34.0)
MCHC: 33.2 g/dL (ref 30.0–36.0)
MCV: 92.7 fL (ref 80.0–100.0)
Monocytes Absolute: 0.2 10*3/uL (ref 0.1–1.0)
Monocytes Relative: 4 %
Neutro Abs: 4.1 10*3/uL (ref 1.7–7.7)
Neutrophils Relative %: 76 %
Platelets: 183 10*3/uL (ref 150–400)
RBC: 2.86 MIL/uL — ABNORMAL LOW (ref 3.87–5.11)
RDW: 13.9 % (ref 11.5–15.5)
WBC: 5.4 10*3/uL (ref 4.0–10.5)
nRBC: 0 % (ref 0.0–0.2)

## 2021-12-24 LAB — COMPREHENSIVE METABOLIC PANEL
ALT: 11 U/L (ref 0–44)
AST: 11 U/L — ABNORMAL LOW (ref 15–41)
Albumin: 3.4 g/dL — ABNORMAL LOW (ref 3.5–5.0)
Alkaline Phosphatase: 81 U/L (ref 38–126)
Anion gap: 14 (ref 5–15)
BUN: 23 mg/dL — ABNORMAL HIGH (ref 6–20)
CO2: 26 mmol/L (ref 22–32)
Calcium: 10 mg/dL (ref 8.9–10.3)
Chloride: 99 mmol/L (ref 98–111)
Creatinine, Ser: 4.05 mg/dL — ABNORMAL HIGH (ref 0.44–1.00)
GFR, Estimated: 14 mL/min — ABNORMAL LOW (ref >60)
Glucose, Bld: 107 mg/dL — ABNORMAL HIGH (ref 70–99)
Potassium: 4.8 mmol/L (ref 3.5–5.1)
Sodium: 139 mmol/L (ref 135–145)
Total Bilirubin: 1.3 mg/dL — ABNORMAL HIGH (ref 0.3–1.2)
Total Protein: 7.6 g/dL (ref 6.5–8.1)

## 2021-12-24 LAB — MAGNESIUM: Magnesium: 2.3 mg/dL (ref 1.7–2.4)

## 2021-12-24 LAB — GLUCOSE, CAPILLARY: Glucose-Capillary: 99 mg/dL (ref 70–99)

## 2021-12-24 LAB — PHOSPHORUS: Phosphorus: 6.5 mg/dL — ABNORMAL HIGH (ref 2.5–4.6)

## 2021-12-24 IMAGING — MR MR HEAD W/O CM
12 of 13 series · 44 of 48 positions shown · non-contrast
Comparison: Brain MRI [DATE]

CLINICAL DATA: Altered mental status

EXAM:
MRI HEAD WITHOUT CONTRAST
TECHNIQUE: Multiplanar, multiecho pulse sequences of the brain and surrounding
structures were obtained without intravenous contrast.

[Series 5: DWI · axial · 3.0mm · 0.88mm/px · z∈[-115,+32]mm · 8 of 100 slices shown (1 of 4)]
[im 1/100]
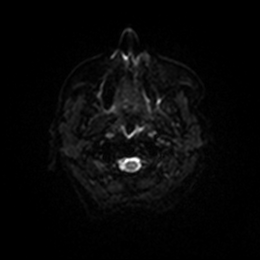
[im 15/100]
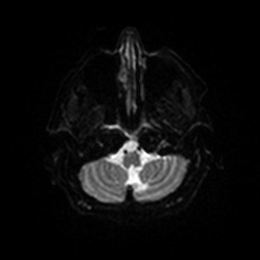
[im 29/100]
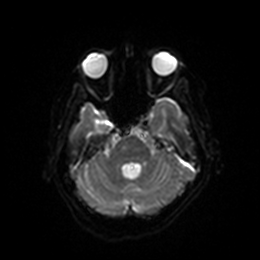
[im 43/100]
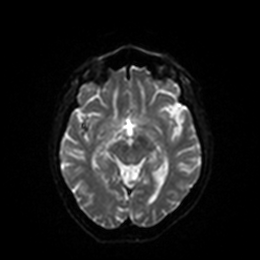
[im 57/100]
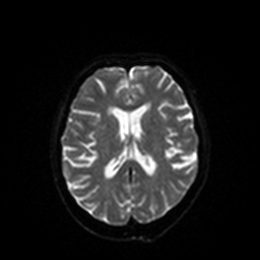
[im 71/100]
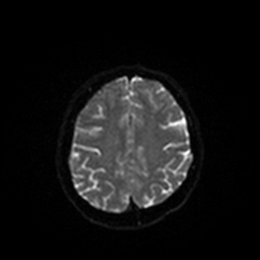
[im 85/100]
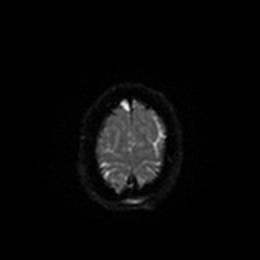
[im 100/100]
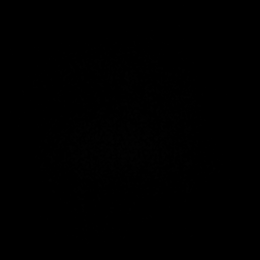

[Series 6: DWI · axial · 3.0mm · 0.88mm/px · z∈[-115,+29]mm · 4 of 49 slices shown (2 of 4)]
[im 1/49]
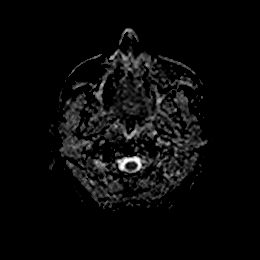
[im 17/49]
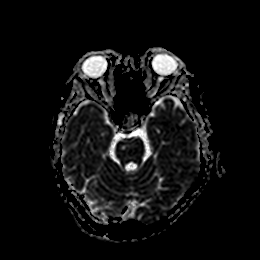
[im 33/49]
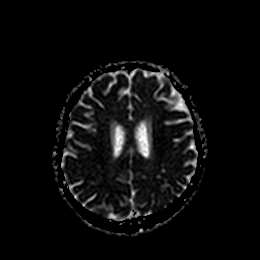
[im 49/49]
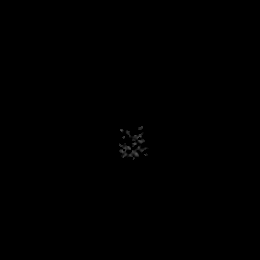

[Series 7: DWI · coronal · 4.0mm · 0.88mm/px · 5 of 64 slices shown (3 of 4)]
[im 1/64]
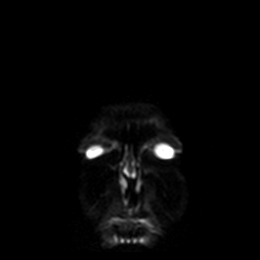
[im 16/64]
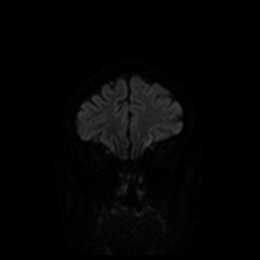
[im 32/64]
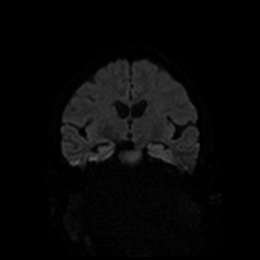
[im 48/64]
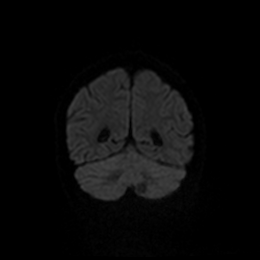
[im 64/64]
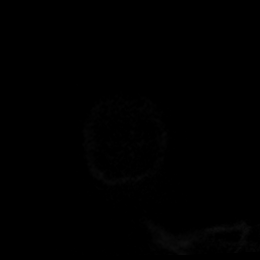

[Series 8: DWI · coronal · 4.0mm · 0.88mm/px · 3 of 32 slices shown (4 of 4)]
[im 1/32]
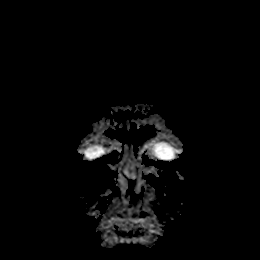
[im 16/32]
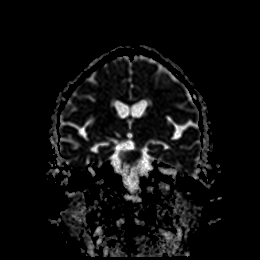
[im 32/32]
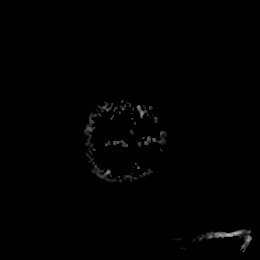

[Series 9: FLAIR · axial · 5.0mm · 0.45mm/px · z∈[-115,+28]mm · 2 of 25 slices shown]
[im 1/25]
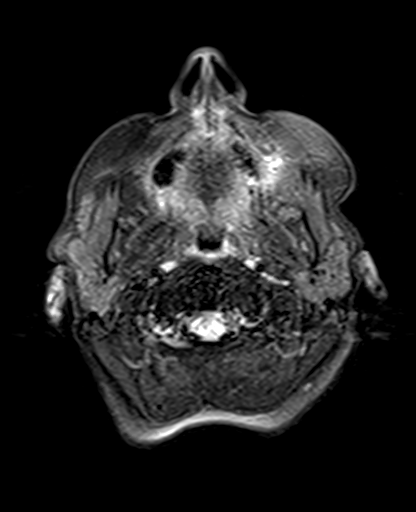
[im 25/25]
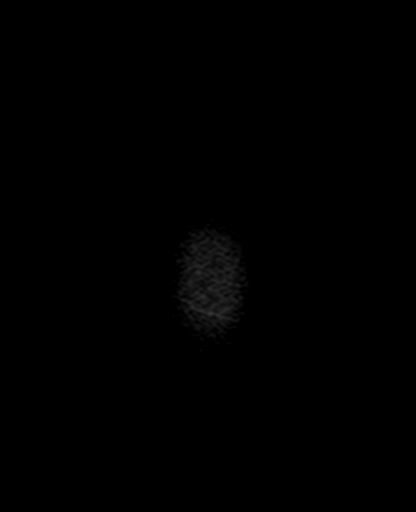

[Series 10: mag_images · axial · 3.0mm · 0.90mm/px · z∈[-125,+39]mm · 4 of 56 slices shown]
[im 1/56]
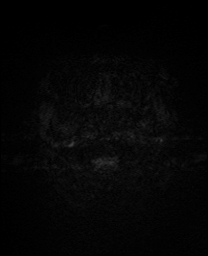
[im 19/56]
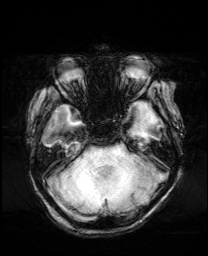
[im 37/56]
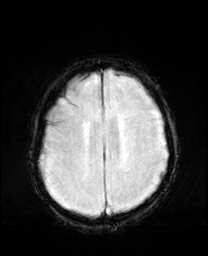
[im 56/56]
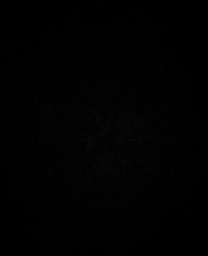

[Series 11: pha_images · axial · 3.0mm · 0.90mm/px · z∈[-122,+30]mm · 4 of 50 slices shown]
[im 1/50]
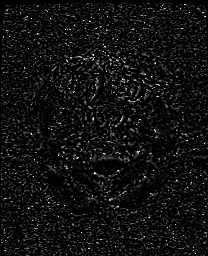
[im 17/50]
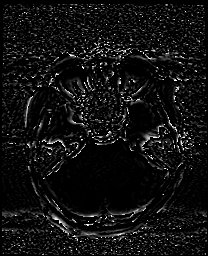
[im 33/50]
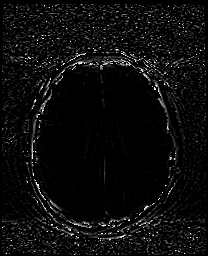
[im 50/50]
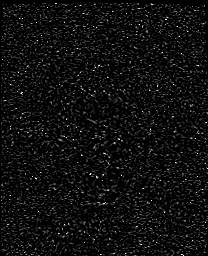

[Series 12: swi_images · axial · 3.0mm · 0.90mm/px · z∈[-125,+39]mm · 4 of 56 slices shown]
[im 1/56]
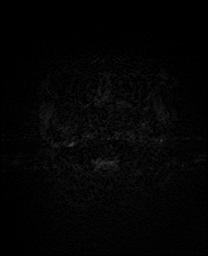
[im 19/56]
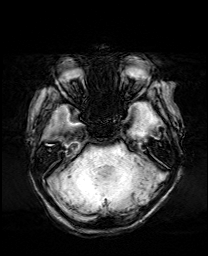
[im 37/56]
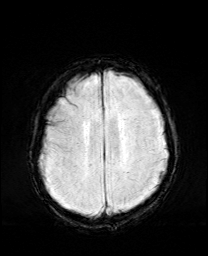
[im 56/56]
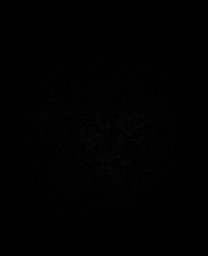

[Series 13: mip_images(sw) · axial · 24.0mm · 0.90mm/px · z∈[-115,+28]mm · 4 of 49 slices shown]
[im 1/49]
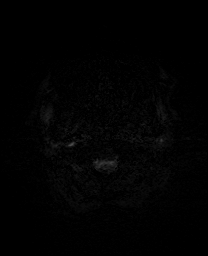
[im 17/49]
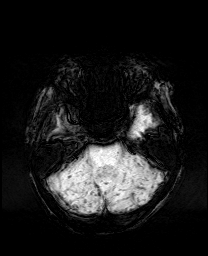
[im 33/49]
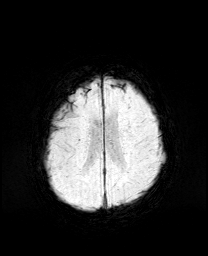
[im 49/49]
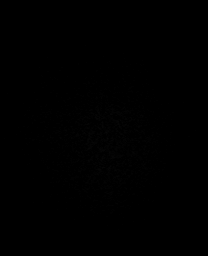

[Series 14: T1 · sagittal · 5.0mm · 0.75mm/px · 2 of 23 slices shown]
[im 1/23]
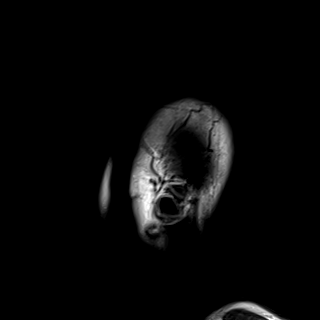
[im 23/23]
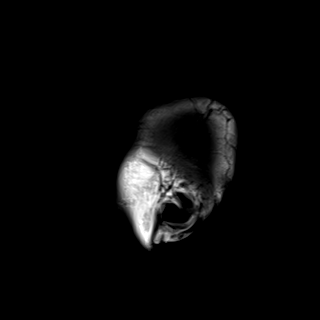

[Series 17: T2 · coronal · 5.0mm · 0.34mm/px · 2 of 29 slices shown (1 of 2)]
[im 1/29]
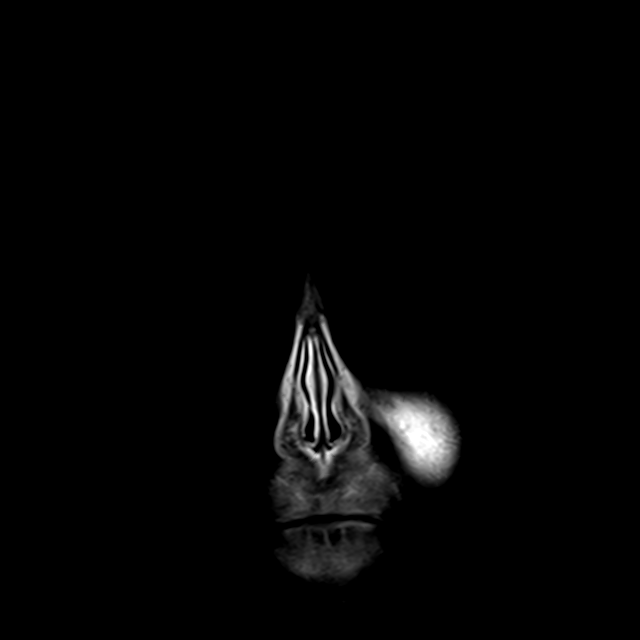
[im 29/29]
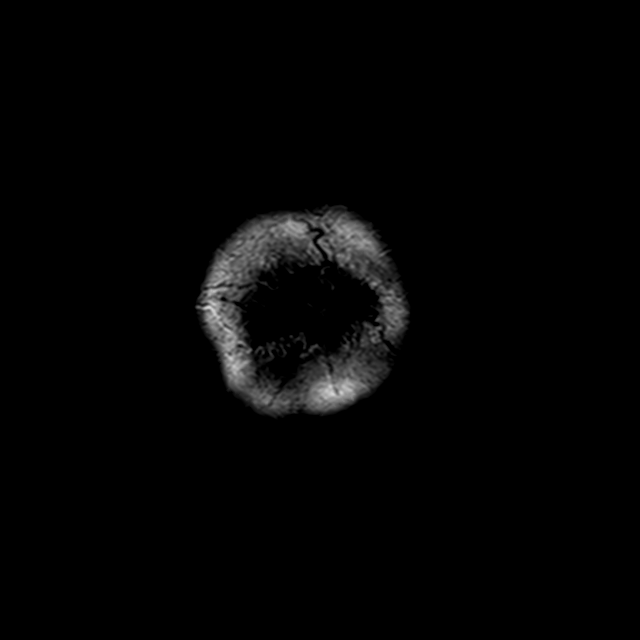

[Series 18: T2 · axial · 5.0mm · 0.72mm/px · z∈[-113,+30]mm · 2 of 25 slices shown (2 of 2)]
[im 1/25]
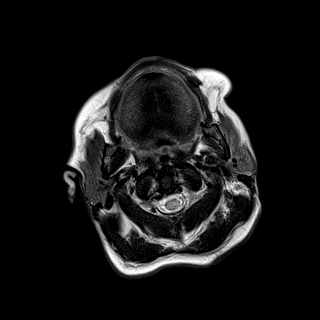
[im 25/25]
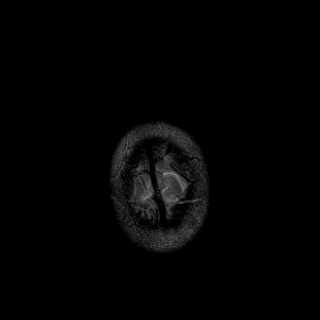

[44 of 48 positions shown; findings below may reference images not displayed]

FINDINGS: Brain: No acute infarct, mass effect or extra-axial collection.
Areas of resolving chronic subdural hemorrhage over the frontal
convexity. There is multifocal hyperintense T2-weighted signal
within the white matter. Parenchymal volume and CSF spaces are
normal. Old left cerebellar and right thalamic small vessel
infarcts. The midline structures are normal.

Vascular: Major flow voids are preserved.

Skull and upper cervical spine: Normal calvarium and skull base.
Visualized upper cervical spine and soft tissues are normal.

Sinuses/Orbits:No paranasal sinus fluid levels or advanced mucosal
thickening. No mastoid or middle ear effusion. Normal orbits.
IMPRESSION: 1. No acute intracranial abnormality.
2. Findings of chronic ischemic microangiopathy and old small vessel
infarcts.

## 2021-12-24 NOTE — Evaluation (Signed)
Physical Therapy Evaluation ?Patient Details ?Name: [name] [name] ?MRN: [id number] ?DOB: 01-May-1981 ?Today's Date: 12/24/2021 ? ?History of Present Illness ? 41 y.o. female presents to [facility] hospital on 12/23/2021 after being found unresponsive at home by a friend. MRI and CT head negative. Pt found to have volume overload. PMH includes ESRD, DM, renal transplant failure, HTN, gastroparesis, narcotic abuse.  ?Clinical Impression ? Pt admitted with above. Pt oriented to self only, responding to majority of questions with "yeah," or "okay," without accuracy and able to follow one step commands with cueing. Pt requiring min assist for functional mobility. Ambulating 200 ft with no assistive device and handheld assist for balance. Presents with dynamic balance deficits and decreased cognition. Suspect once cognition clears, pt will improve.  ?   ? ?Recommendations for follow up therapy are one component of a multi-disciplinary discharge planning process, led by the attending physician.  Recommendations may be updated based on patient status, additional functional criteria and insurance authorization. ? ?Follow Up Recommendations Home health PT (pending improved cognition) ? ?  ?Assistance Recommended at Discharge Frequent or constant Supervision/Assistance  ?Patient can return home with the following ? A little help with walking and/or transfers;Assistance with cooking/housework;Direct supervision/assist for medications management;Direct supervision/assist for financial management;Assist for transportation ? ?  ?Equipment Recommendations None recommended by PT  ?Recommendations for Other Services ?    ?  ?Functional Status Assessment Patient has had a recent decline in their functional status and demonstrates the ability to make significant improvements in function in a reasonable and predictable amount of time.  ? ?  ?Precautions / Restrictions Precautions ?Precautions: Fall ?Restrictions ?Weight Bearing Restrictions: No   ? ?  ? ?Mobility ? Bed Mobility ?Overal bed mobility: Needs Assistance ?Bed Mobility: Supine to Sit, Sit to Supine ?  ?  ?Supine to sit: Min assist ?Sit to supine: Supervision ?  ?General bed mobility comments: assist for initiation ?  ? ?Transfers ?Overall transfer level: Needs assistance ?Equipment used: 1 person hand held assist ?Transfers: Sit to/from Stand ?Sit to Stand: Min assist ?  ?  ?  ?  ?  ?General transfer comment: Assist for initiation ?  ? ?Ambulation/Gait ?Ambulation/Gait assistance: Min assist, +2 safety/equipment ?Gait Distance (Feet): 200 Feet ?Assistive device: 1 person hand held assist ?Gait Pattern/deviations: Step-through pattern, Decreased stride length ?Gait velocity: decreased ?  ?  ?General Gait Details: Pt requiring HHA for balance/stability ? ?Stairs ?  ?  ?  ?  ?  ? ?Wheelchair Mobility ?  ? ?Modified Rankin (Stroke Patients Only) ?  ? ?  ? ?Balance Overall balance assessment: Needs assistance ?Sitting-balance support: Feet supported ?Sitting balance-Leahy Scale: Good ?  ?  ?Standing balance support: Single extremity supported, During functional activity ?Standing balance-Leahy Scale: Poor ?Standing balance comment: posterior bias ?  ?  ?  ?  ?  ?  ?  ?  ?  ?  ?  ?   ? ? ? ?Pertinent Vitals/Pain Pain Assessment ?Pain Assessment: Faces ?Faces Pain Scale: Hurts little more ?Pain Location: generalized? Pt stating, "I have pain," but would not indicate where ?Pain Descriptors / Indicators: Grimacing ?Pain Intervention(s): Monitored during session  ? ? ?Home Living Family/patient expects to be discharged to:: Private residence ?Living Arrangements: Alone ?  ?  ?  ?  ?  ?  ?  ?  ?Additional Comments: home set up to be confirmed. admission notes from 3/09 and 9/62 have conflicting information. pt unable to provide information this date.  ?  ?  Prior Function Prior Level of Function : Driving;Patient poor historian/Family not available ?  ?  ?  ?  ?  ?  ?Mobility Comments: info from prior  admission: reports able to walk dog and drive herself to dialysis ?ADLs Comments: info from prior admission: reports independence with medication management, and self care ?  ? ? ?Hand Dominance  ? Dominant Hand: Right ? ?  ?Extremity/Trunk Assessment  ? Upper Extremity Assessment ?Upper Extremity Assessment: Defer to OT evaluation ?  ? ?Lower Extremity Assessment ?Lower Extremity Assessment: Overall WFL for tasks assessed ?  ? ?Cervical / Trunk Assessment ?Cervical / Trunk Assessment: Normal  ?Communication  ? Communication: No difficulties  ?Cognition Arousal/Alertness: Awake/alert ?Behavior During Therapy: Flat affect ?Overall Cognitive Status: Impaired/Different from baseline ?Area of Impairment: Orientation, Attention, Memory, Following commands, Safety/judgement, Awareness, Problem solving ?  ?  ?  ?  ?  ?  ?  ?  ?Orientation Level: Disoriented to, Place, Person, Time, Situation ?Current Attention Level: Sustained ?Memory: Decreased short-term memory ?Following Commands: Follows one step commands inconsistently ?Safety/Judgement: Decreased awareness of safety, Decreased awareness of deficits ?Awareness: Intellectual ?Problem Solving: Decreased initiation, Requires verbal cues, Requires tactile cues ?General Comments: Pt responding "yeah," or "okay," to majority of questions without accuracy. Verbalizing, "I have pain," but unable to indicate where. Easily redirected to follow one step commands. ?  ?  ? ?  ?General Comments   ? ?  ?Exercises    ? ?Assessment/Plan  ?  ?PT Assessment Patient needs continued PT services  ?PT Problem List Decreased strength;Decreased activity tolerance;Decreased balance;Decreased mobility;Decreased cognition;Decreased safety awareness ? ?   ?  ?PT Treatment Interventions Stair training;Gait training;Functional mobility training;Therapeutic activities;Therapeutic exercise;Balance training;Patient/family education   ? ?PT Goals (Current goals can be found in the Care Plan section)   ?Acute Rehab PT Goals ?Patient Stated Goal: did not state ?PT Goal Formulation: With patient ?Time For Goal Achievement: 01/07/22 ?Potential to Achieve Goals: Good ? ?  ?Frequency Min 3X/week ?  ? ? ?Co-evaluation   ?  ?  ?  ?  ? ? ?  ?AM-PAC PT "6 Clicks" Mobility  ?Outcome Measure Help needed turning from your back to your side while in a flat bed without using bedrails?: A Little ?Help needed moving from lying on your back to sitting on the side of a flat bed without using bedrails?: A Little ?Help needed moving to and from a bed to a chair (including a wheelchair)?: A Little ?Help needed standing up from a chair using your arms (e.g., wheelchair or bedside chair)?: A Little ?Help needed to walk in hospital room?: A Little ?Help needed climbing 3-5 steps with a railing? : A Little ?6 Click Score: 18 ? ?  ?End of Session   ?Activity Tolerance: Patient tolerated treatment well ?Patient left: in bed;with call bell/phone within reach;with bed alarm set ?Nurse Communication: Mobility status ?PT Visit Diagnosis: Unsteadiness on feet (R26.81) ?  ? ?Time: 3235-5732 ?PT Time Calculation (min) (ACUTE ONLY): 16 min ? ? ?Charges:   PT Evaluation ?$PT Eval Low Complexity: 1 Low ?  ?  ?   ? ? ?Wyona Almas, PT, DPT ?Acute Rehabilitation Services ?Pager [phone] ?Office [phone] ? ? ?Carloine Margo Aye ?12/24/2021, 3:41 PM ? ?

## 2021-12-24 NOTE — Progress Notes (Signed)
?[facility] ?Progress Note  ? ?Subjective:   Patient seen and examined at bedside.  Remains confused.  Answers "yeah" to all questions.  Nurse says improved from admits.  Not at baseline.  ? ?Objective ?Vitals:  ? 12/24/21 0900 12/24/21 0904 12/24/21 1020 12/24/21 1221  ?BP:  (!) 177/70 (!) 172/84 (!) 162/85  ?Pulse: (!) 109 (!) 109 (!) 114 (!) 114  ?Resp: '14 20 16 17  '$ ?Temp:    98.8 ?F (37.1 ?C)  ?TempSrc:    Oral  ?SpO2: 97% 99% 93% 96%  ?Weight:      ? ?Physical Exam ?General:chronically ill appearing female in NAD ?Heart:+tachycardia, no murmur ?Lungs:CTAB, nml WOB ?Abdomen:soft, NTND ?Extremities:no LE edema ?Dialysis Access: RUE AVF +b/t  ? ?Filed Weights  ? 12/24/21 0500  ?Weight: 50.2 kg  ? ? ?Intake/Output Summary (Last 24 hours) at 12/24/2021 1244 ?Last data filed at 12/23/2021 1735 ?Gross per 24 hour  ?Intake --  ?Output 2019 ml  ?Net -2019 ml  ? ? ?Additional Objective ?Labs: ?Basic Metabolic Panel: ?Recent Labs  ?Lab 12/23/21 ?0954 12/23/21 ?1003 12/23/21 ?1006 12/24/21 ?0230  ?NA 140 137 138 139  ?K 5.9* 5.5* 5.4* 4.8  ?CL 98 103  --  99  ?CO2 25  --   --  26  ?GLUCOSE 97 99  --  107*  ?BUN 41* 43*  --  23*  ?CREATININE 7.23* 8.30*  --  4.05*  ?CALCIUM 10.3  --   --  10.0  ? ?Liver Function Tests: ?Recent Labs  ?Lab 12/23/21 ?7408 12/24/21 ?0230  ?AST 17 11*  ?ALT 13 11  ?ALKPHOS 93 81  ?BILITOT 0.8 1.3*  ?PROT 7.9 7.6  ?ALBUMIN 3.8 3.4*  ? ?Recent Labs  ?Lab 12/23/21 ?1448  ?LIPASE 45  ? ?CBC: ?Recent Labs  ?Lab 12/23/21 ?1006 12/23/21 ?1013 12/24/21 ?0230  ?WBC  --  7.4 5.4  ?NEUTROABS  --  5.8 4.1  ?HGB 8.5* 8.8* 8.8*  ?HCT 25.0* 27.3* 26.5*  ?MCV  --  94.8 92.7  ?PLT  --  169 183  ? ?Blood Culture ?   ?Component Value Date/Time  ? SDES BLOOD BLOOD LEFT ARM 12/23/2021 0950  ? SPECREQUEST  12/23/2021 0950  ?  BOTTLES DRAWN AEROBIC AND ANAEROBIC Blood Culture adequate volume  ? CULT  12/23/2021 0950  ?  NO GROWTH < 24 HOURS ?Performed at [facility], [place] [address]  [place], [place] [address] ?  ? REPTSTATUS PENDING 12/23/2021 0950  ? ? ?Cardiac Enzymes: ?Recent Labs  ?Lab 12/23/21 ?1497  ?CKTOTAL 33*  ? ?CBG: ?Recent Labs  ?Lab 12/23/21 ?0263 12/23/21 ?1608  ?GLUCAP 91 136*  ? ?I ? ?Studies/Results: ?CT HEAD WO CONTRAST (5MM) ? ?Result Date: 12/23/2021 ?CLINICAL DATA:  Altered mental status. EXAM: CT HEAD WITHOUT CONTRAST TECHNIQUE: Contiguous axial images were obtained from the base of the skull through the vertex without intravenous contrast. RADIATION DOSE REDUCTION: This exam was performed according to the departmental dose-optimization program which includes automated exposure control, adjustment of the mA and/or kV according to patient size and/or use of iterative reconstruction technique. COMPARISON:  December 01, 2021. FINDINGS: Brain: No evidence of acute infarction, hemorrhage, hydrocephalus, extra-axial collection or mass lesion/mass effect. Subdural hematoma noted on prior exam has resolved. 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 Vascular: No hyperdense vessel or unexpected calcification. Skull: Normal. Negative for fracture or focal lesion. Sinuses/Orbits: No acute finding. Other: None. IMPRESSION: No acute intracranial abnormality seen. Electronically Signed   By:  Marijo Conception M.D.   On: 12/23/2021 10:32  ? ?MR BRAIN WO CONTRAST ? ?Result Date: 12/24/2021 ?CLINICAL DATA:  Altered mental status EXAM: MRI HEAD WITHOUT CONTRAST TECHNIQUE: Multiplanar, multiecho pulse sequences of the brain and surrounding structures were obtained without intravenous contrast. COMPARISON:  Brain MRI 11/09/2021 FINDINGS: Brain: No acute infarct, mass effect or extra-axial collection. Areas of resolving chronic subdural hemorrhage over the frontal convexity. There is multifocal hyperintense T2-weighted signal within the white matter. Parenchymal volume and CSF spaces are normal. 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 The midline structures are normal. Vascular:  Major flow voids are preserved. Skull and upper cervical spine: Normal calvarium and skull base. Visualized upper cervical spine and soft tissues are normal. Sinuses/Orbits:No paranasal sinus fluid levels or advanced mucosal thickening. No mastoid or middle ear effusion. Normal orbits. IMPRESSION: 1. No acute intracranial abnormality. 2. 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 Electronically Signed   By: Ulyses Jarred M.D.   On: 12/24/2021 01:44  ? ?DG Chest Portable 1 View ? ?Result Date: 12/23/2021 ?CLINICAL DATA:  Altered mental status and hypoxia. EXAM: PORTABLE CHEST 1 VIEW COMPARISON:  12/01/2021 FINDINGS: Cardiomegaly. Congested appearance of vessels with interstitial and hazy symmetric pulmonary opacity. There are [employer] and trace left pleural effusion. IMPRESSION: CHF. Electronically Signed   By: Jorje Guild M.D.   On: 12/23/2021 10:42   ? ?Medications: ? ? Chlorhexidine Gluconate Cloth  6 each Topical Q0600  ? cloNIDine  0.2 mg Transdermal Weekly  ? cycloSPORINE modified  200 mg Oral BID  ? cycloSPORINE modified  25 mg Oral QPC lunch  ? Darbepoetin Alfa  60 mcg Intravenous Q Fri-HD  ? doxercalciferol  6 mcg Intravenous Q M,W,F-HD  ? ferric citrate  630 mg Oral TID  ? heparin  2,000 Units Dialysis Once in dialysis  ? heparin  5,000 Units Subcutaneous Q8H  ? methylPREDNISolone (SOLU-MEDROL) injection  60 mg Intravenous Q24H  ? metoprolol tartrate  5 mg Intravenous Q6H  ? mycophenolate  540 mg Oral BID  ? pantoprazole  40 mg Oral Daily  ? sodium chloride flush  3 mL Intravenous Q12H  ? sodium zirconium cyclosilicate  10 g Oral Q T,Th,S,Su  ? ? ?Dialysis Orders: ?AF MWF ?3h 60mn  400/500   2/2 bath  RUE AVF  EDW 54kg 15g   ?No Heparin ?Sensipar '90mg'$  qHD ?Hectorol 941m qHD ?Mircera [id number] - last 4/19 ? - 4/02 > hep B Ag was neg and hep B Abs were > 10 (= protective)  ?  ?  ?Assessment/ Plan: ?AMS - here last month w/ similar issues. Some improvement per  nursing but not close to baseline.  MRI negative. Work up per PMD.  ?ESRD - on HD MWF. Next HD on 4/24.  ?AHRF - due to vol overload w/ pulm edema on CXR. Max UF w/HD yesterday, net 2L removed.  ?HTN - on norvasc, catapres patch, losartan at home. Spitting out PO meds. On higher dose catapres patch scheduled IV metoprolol '5mg'$  QID and hydralazine '5mg'$  QID prn.  May need NG tube. Get vol down ?Hx kidney/ pancreas transplant - kidney tx failed, still on IS meds for pancreas ?Recent COViD infection - here for 12 days in march 2023.  ?Anemia ckd - Hb 8.8.  ESA recently dosed.  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 Hold VDRA, continue sensipar. Add on phos.  ? ?LinJen MowA-C ?[facility] ?  12/24/2021,12:44 PM ? LOS: 0 days  ? ? ?

## 2021-12-24 NOTE — Progress Notes (Signed)
?PROGRESS NOTE ? ? ? ?[name] [name]  WER:[id number] DOB: [birth year] DOA: 12/23/2021 ?PCP: [facility] [place]  ? ? ?Brief Narrative:  ?[name] [name] is a 41 y.o. 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  When her friends checked her at home she was found to be unresponsive.  It looked like there was a substance, maybe vomited in her mouth.  Mention of chronic pain at baseline.  Patient had last gone for hemodialysis on Wednesday.  In the ED, patient was noted to have elevated blood pressure with hypoxia with saturation in the 80% range.  Chest x-ray showed volume overload.  CT head was negative.  Neurology was consulted who recommended MRI.  Patient was then admitted hospital for further evaluation and treatment.   ? ?Assessment and Plan: ? Principal Problem: ?  Acute encephalopathy ?Active Problems: ?  Subdural hematoma (HCC) ?  ESRD on dialysis [facility]) ?  History of renal transplant ?  Type 2 diabetes mellitus with other diabetic kidney complication (HCC) ?  Pancreas transplant status ([place]) ?  Narcotic dependence ([place]) ?  Transplant rejection ?  HTN (hypertension), malignant ?  Hypervolemia associated with renal insufficiency ?  ?Acute metabolic encephalopathy ?Likely multifactorial from polypharmacy, hypertensive urgency and uremia.    Possibility of opioid overdose.  Patient underwent hemodialysis.  Ammonia within normal limits.  No leukocytosis to suggest infection.  Neurology was curbside the admitting provider who recommended MRI of the brain which showed no acute intracranial abnormality.  PO limited at this time and the patient is spitting out tablets.  Continue with IV fluids. ?  ?Recent Subdural hematoma ([place]) ?From  recent hospitalization.  CT head this time shows resolution of SDH.  MRI of the brain showing no acute intracranial abnormality ?   ?Uncontrolled HTN ?On presentation.  Elevated blood pressure likely secondary to volume overload.  Continue Catapres.  Patient was recently started on amlodipine losartan.  History of noncompliance as well.  Will resume as able but currently on IV hydralazine due to encephalopathy.. ?  ?ESRD on dialysis [facility]) ?Patient is Monday Wednesday Friday hemodialysis schedule at his baseline.  Chest x-ray showed pulmonary edema.  Patient underwent hemodialysis on presentation. ? ?History of renal and pancreatic transplant ?-She has reported h/o noncompliance with immunosuppressants and has failed renal transplant.  Continue mycophenolate, cyclosporine.  On Solu-Medrol instead of prednisone stress dose ?  ?Type 1 diabetes ([place]) ?-History of pancreatic transplant and not on medication.  Last hemoglobin A1c was 4.1.   ?   ?Narcotic dependence ?Patient with history of chronic pain.  On opiates as outpatient. ?  ? DVT prophylaxis: heparin injection 5,000 Units Start: 12/23/21 2200 ? ? ?Code Status:   ?  Code Status: Full Code ? ?Disposition: Home ? ?Status is: Observation ? ?The patient will require care spanning > 2 midnights and should be moved to inpatient because: Metabolic encephalopathy, n.p.o. status, ? ? Family Communication:  ?I spoke with the patient's friend on the phone and updated her about the clinical condition of the patient.  Patient does not have  family members here in [place]. ? ?Consultants:  ?Nephrology ? ?Procedures:  ?Hemodialysis. ? ?Antimicrobials:  ?None ? ?Anti-infectives (From admission, onward)  ? ? None  ? ?  ? ?Subjective: ?Today, patient  was seen and examined at bedside.  Patient complains of pain.  Confused and disoriented.  Spoke with the patient's friend who stated that the patient takes narcotics for pain.  She might have taken excessive narcotics.  Nursing  staff reported that she was spitting up tablets. ? ?Objective: ?Vitals:  ? 12/24/21 0843 12/24/21 0900 12/24/21 0904 12/24/21 1020  ?BP: (!) 186/71  (!) 177/70 (!) 172/84  ?Pulse: (!) 106 (!) 109 (!) 109 (!) 114  ?Resp: (!) '22 14 20 16  '$ ?Temp: 98.7 ?F (37.1 ?C)     ?TempSrc: Oral     ?SpO2: 97% 97% 99% 93%  ?Weight:      ? ? ?Intake/Output Summary (Last 24 hours) at 12/24/2021 1113 ?Last data filed at 12/23/2021 1735 ?Gross per 24 hour  ?Intake --  ?Output 2019 ml  ?Net -2019 ml  ? ?Filed Weights  ? 12/24/21 0500  ?Weight: 50.2 kg  ? ? ?Physical Examination: ?Body mass index is 19 kg/m?.  ? ?General:  Thinly built, not in obvious distress, alert awake but disoriented, repeating the same answer. ?HENT:   No scleral pallor or icterus noted. Oral mucosa is moist.  ?Chest:  Clear breath sounds.  Diminished breath sounds bilaterally.  ?CVS: S1 &S2 heard. No murmur.  Regular rate and rhythm. ?Abdomen: Soft, nontender, nondistended.  Nonspecific tenderness on palpation.  Bowel sounds are heard.   ?Extremities: No cyanosis, clubbing or edema.  Peripheral pulses are palpable. ?Psych: Alert, awake but confused and disoriented, repeating "no" ?CNS:  No cranial nerve deficits.  Moving all extremities. ?Skin: Warm and dry.  No rashes noted. ? ?Data Reviewed:  ? ?CBC: ?Recent Labs  ?Lab 12/23/21 ?1003 12/23/21 ?1006 12/23/21 ?1013 12/24/21 ?0230  ?WBC  --   --  7.4 5.4  ?NEUTROABS  --   --  5.8 4.1  ?HGB 10.2* 8.5* 8.8* 8.8*  ?HCT 30.0* 25.0* 27.3* 26.5*  ?MCV  --   --  94.8 92.7  ?PLT  --   --  169 183  ? ? ?Basic Metabolic Panel: ?Recent Labs  ?Lab 12/23/21 ?0954 12/23/21 ?1003 12/23/21 ?1006 12/24/21 ?0230  ?NA 140 137 138 139  ?K 5.9* 5.5* 5.4* 4.8  ?CL 98 103  --  99  ?CO2 25  --   --  26  ?GLUCOSE 97 99  --  107*  ?BUN 41* 43*  --  23*  ?CREATININE 7.23* 8.30*  --  4.05*  ?CALCIUM 10.3  --   --  10.0  ?MG  --   --   --  2.3  ? ? ?Liver Function Tests: ?Recent Labs  ?Lab 12/23/21 ?0277 12/24/21 ?0230  ?AST 17 11*  ?ALT 13 11   ?ALKPHOS 93 81  ?BILITOT 0.8 1.3*  ?PROT 7.9 7.6  ?ALBUMIN 3.8 3.4*  ? ? ? ?Radiology Studies: ?CT HEAD WO CONTRAST (5MM) ? ?Result Date: 12/23/2021 ?CLINICAL DATA:  Altered mental status. EXAM: CT HEAD WITHOUT CONTRAST TECHNIQUE: Contiguous axial images were obtained from the base of the skull through the vertex without intravenous contrast. RADIATION DOSE REDUCTION: This exam was performed according to the departmental dose-optimization program which includes automated exposure control, adjustment of the mA and/or kV according to patient size and/or use of iterative reconstruction technique. COMPARISON:  December 01, 2021. FINDINGS: Brain: No evidence of acute infarction, hemorrhage, hydrocephalus, extra-axial collection or mass lesion/mass effect. Subdural hematoma noted on prior exam has resolved. 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 Vascular: No hyperdense vessel or unexpected calcification.  Skull: Normal. Negative for fracture or focal lesion. Sinuses/Orbits: No acute finding. Other: None. IMPRESSION: No acute intracranial abnormality seen. Electronically Signed   By: Marijo Conception M.D.   On: 12/23/2021 10:32  ? ?MR BRAIN WO CONTRAST ? ?Result Date: 12/24/2021 ?CLINICAL DATA:  Altered mental status EXAM: MRI HEAD WITHOUT CONTRAST TECHNIQUE: Multiplanar, multiecho pulse sequences of the brain and surrounding structures were obtained without intravenous contrast. COMPARISON:  Brain MRI 11/09/2021 FINDINGS: Brain: No acute infarct, mass effect or extra-axial collection. Areas of resolving chronic subdural hemorrhage over the frontal convexity. There is multifocal hyperintense T2-weighted signal within the white matter. Parenchymal volume and CSF spaces are normal. 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 The midline structures are normal. Vascular: Major flow voids are preserved. Skull and upper cervical spine: Normal calvarium and skull base. Visualized upper cervical spine and soft  tissues are normal. Sinuses/Orbits:No paranasal sinus fluid levels or advanced mucosal thickening. No mastoid or middle ear effusion. Normal orbits. IMPRESSION: 1. No acute intracranial abnormality. 2.

## 2021-12-24 NOTE — Evaluation (Signed)
Occupational Therapy Evaluation ?Patient Details ?Name: [name] [name] ?MRN: [id number] ?DOB: 1981/06/09 ?Today's Date: 12/24/2021 ? ? ?History of Present Illness 41 y.o. female presents to [facility] hospital on 12/23/2021 after being found unresponsive at home by a friend. MRI and CT head negative. Pt found to have volume overload. PMH includes ESRD, DM, renal transplant failure, HTN, gastroparesis, narcotic abuse.  ? ?Clinical Impression ?  ?[name] [name] was evaluated s/p the above admission list, per chart history she is generally indep at baseline including driving herself to HD. However, there is conflicting home set up and support information in the chart and pt was unable to confirm this date. Upon arrival pt's HR was resting at 112, but was in the 90s during evaluation. She made limited verbalizations this date stating "yeah" to almost all questions including, "Are you at home?" Overall she required max A to get to the EOB, but then was able to get herself back into the bed with minG. Question effort vs. Confusion. Due to lethargy and difficulty following commands she requires max-total A for all LB ADLs, and max A for all UB ADLs. Pt will benefit from continued OT to address the limitations listed below. Due to high assist levels, recommend SNF at d/c. However if pt is able to demonstrate progress towards goals and have 24/7 support at home, she may be able to progress to d/c to home. ?   ? ?Recommendations for follow up therapy are one component of a multi-disciplinary discharge planning process, led by the attending physician.  Recommendations may be updated based on patient status, additional functional criteria and insurance authorization.  ? ?Follow Up Recommendations ? Skilled nursing-short term rehab (<3 hours/day)  ?  ?Assistance Recommended at Discharge Frequent or constant Supervision/Assistance  ?Patient can return home with the following A lot of help with walking and/or transfers;A lot of help with  bathing/dressing/bathroom;Assistance with feeding;Assistance with cooking/housework;Direct supervision/assist for medications management;Direct supervision/assist for financial management;Assist for transportation;Help with stairs or ramp for entrance ? ?  ?Functional Status Assessment ? Patient has had a recent decline in their functional status and demonstrates the ability to make significant improvements in function in a reasonable and predictable amount of time.  ?Equipment Recommendations ? Other (comment) (continue to assess needs)  ?  ?   ?Precautions / Restrictions Precautions ?Precautions: Fall ?Restrictions ?Weight Bearing Restrictions: No  ? ?  ? ?Mobility Bed Mobility ?Overal bed mobility: Needs Assistance ?Bed Mobility: Supine to Sit, Sit to Supine ?  ?  ?Supine to sit: Max assist ?Sit to supine: Min assist ?  ?General bed mobility comments: required assist with all aspects of getting OOB, once sitting EOB for ~1 minute, pt then get herself back into bed with min G only. ?  ? ?Transfers ?  ?  ?  ?  ?  ?  ?  ?  ?  ?General transfer comment: unable to safely attempt this date ?  ? ?  ?Balance Overall balance assessment: Needs assistance ?Sitting-balance support: Feet supported ?Sitting balance-Leahy Scale: Poor ?Sitting balance - Comments: initally with fair sitting balance, after 1 minute she required assist for balance prior to getting back into supine ?  ?  ?  ?  ?  ?  ?   ? ?ADL either performed or assessed with clinical judgement  ? ?ADL Overall ADL's : Needs assistance/impaired ?Eating/Feeding: NPO ?  ?Grooming: Maximal assistance;Sitting ?  ?Upper Body Bathing: Maximal assistance;Sitting ?  ?Lower Body Bathing: Total assistance;Bed level ?  ?Upper  Body Dressing : Maximal assistance;Sitting ?  ?Lower Body Dressing: Total assistance;Sit to/from stand ?  ?Toilet Transfer: Total assistance ?  ?Toileting- Clothing Manipulation and Hygiene: Total assistance ?  ?  ?  ?Functional mobility during ADLs:  Maximal assistance (bed level only) ?General ADL Comments: pt gave very limited effort this date. anticipate she would be able to assist more with funcitonal tasks. Pt required max A to get OOB, not followsing commands but then got herself back into bed with supervision A.  ? ? ? ?Vision Baseline Vision/History: 0 No visual deficits ?Vision Assessment?: Vision impaired- to be further tested in functional context ?Additional Comments: diffiuclt to assess - pt had eyes closed for 75% of the sessoin  ?   ?   ?   ? ?Pertinent Vitals/Pain Pain Assessment ?Pain Assessment: Faces ?Faces Pain Scale: Hurts little more ?Pain Location: generalized? ?Pain Descriptors / Indicators: Grimacing ?Pain Intervention(s): Monitored during session  ? ? ? ?Hand Dominance Right ?  ?Extremity/Trunk Assessment Upper Extremity Assessment ?Upper Extremity Assessment: Difficult to assess due to impaired cognition (pt bnot following commands for assessment - seemingly using L&R euqally for bed mobility) ?  ?Lower Extremity Assessment ?Lower Extremity Assessment: Defer to PT evaluation ?  ?Cervical / Trunk Assessment ?Cervical / Trunk Assessment: Normal ?  ?Communication Communication ?Communication: Expressive difficulties (pt only verbalizing "yeah" this date.) ?  ?Cognition Arousal/Alertness: Lethargic ?Behavior During Therapy: Flat affect ?Overall Cognitive Status: Impaired/Different from baseline ?Area of Impairment: Orientation, Attention, Memory, Following commands, Safety/judgement, Awareness, Problem solving ?  ?  ?  ?  ?  ?  ?  ?  ?Orientation Level: Disoriented to, Place, Person, Time, Situation ?Current Attention Level: Focused ?Memory: Decreased recall of precautions, Decreased short-term memory ?Following Commands: Follows one step commands inconsistently ?Safety/Judgement: Decreased awareness of safety, Decreased awareness of deficits ?Awareness: Intellectual ?Problem Solving: Decreased initiation, Requires verbal cues, Requires  tactile cues ?General Comments: Pt grimacing upon arrival, unable to verbalize orientation questions. when askd if she was "at home" pt said "yeah." Initally folllowing some simple commands with multimodal cues, once pt came to EOB she stopped following commands and brought herself back into supine seemingly ignoring therapist ?  ?  ?General Comments  HR 112 upon arrival while resting in supine. HR in the 90s during assessment ? ?  ? ?Home Living Family/patient expects to be discharged to:: Private residence ?Living Arrangements: Alone ?  ?  ?  ?  ?  ?  ?  ?  ?  ?  ?  ?  ?  ?  ?  ?Additional Comments: home set up to be confirmed. admission notes from 3/09 and 5/63 have conflicting information. pt unable to provide information this date. ?  ? ?  ?Prior Functioning/Environment Prior Level of Function : Driving;Patient poor historian/Family not available ?  ?  ?  ?  ?  ?  ?Mobility Comments: info fom prior admission: reports able to walk dog and drive herself to dialysis ?ADLs Comments: info fom prior admission: reports independence with medication management, and self care ?  ? ?  ?  ?OT Problem List: Decreased strength;Decreased range of motion;Decreased activity tolerance;Impaired balance (sitting and/or standing);Decreased safety awareness;Decreased knowledge of use of DME or AE;Decreased knowledge of precautions;Pain ?  ?   ?OT Treatment/Interventions: Therapeutic exercise;Self-care/ADL training;Therapeutic activities;Patient/family education;Balance training;DME and/or AE instruction  ?  ?OT Goals(Current goals can be found in the care plan section) Acute Rehab OT Goals ?Patient Stated Goal: unable to state ?OT Goal  Formulation: Patient unable to participate in goal setting ?Time For Goal Achievement: 01/07/22 ?Potential to Achieve Goals: Fair ?ADL Goals ?Pt Will Perform Grooming: with min assist;standing ?Pt Will Perform Upper Body Dressing: with min assist;sitting ?Pt Will Perform Lower Body Dressing: with min  assist;sit to/from stand ?Pt Will Transfer to Toilet: with min assist;ambulating ?Additional ADL Goal #1: Pt will complete IADL medication management task with min cues in a nondistracting environment  ?OT Frequency:

## 2021-12-24 NOTE — Progress Notes (Signed)
TRH night cross cover note: ? ?I was notified by RN that this patient, who was admitted earlier in the evening for acute encephalopathy, remains encephalopathic and unable to take her home oral medications at this time, which include immunosuppressive medications in the setting of history of renal transplant. Will continue to monitor ensuing trend in mental status with provision of home medications when able to take PO in this context.  ? ? ? ?Babs Bertin, DO ?Hospitalist  ?

## 2021-12-24 NOTE — Hospital Course (Addendum)
[name] [name] is a 41 y.o. 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 She was last admitted from 3/30-4/2.  When her friends checked her at home she was found to be unresponsive.  It looked like there was a substance, maybe vomited in her mouth.  Mention of chronic pain at baseline.  Patient had last gone for hemodialysis on Wednesday.  In the ED, patient was noted to have elevated blood pressure with hypoxia with saturation in the 80% range.  Chest x-ray showed volume overload.  CT head was negative.  Neurology was consulted who recommended MRI.  Patient was then admitted hospital for further evaluation and treatment.   ? ?Assessment and Plan: ? Principal Problem: ?  Acute encephalopathy ?Active Problems: ?  Subdural hematoma (HCC) ?  ESRD on dialysis [facility]) ?  History of renal transplant ?  Type 2 diabetes mellitus with other diabetic kidney complication (HCC) ?  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  Narcotic dependence ([place]) ?  Transplant rejection ?  HTN (hypertension), malignant ?  Hypervolemia associated with renal insufficiency ?  ?Acute metabolic encephalopathy ?Likely multifactorial from polypharmacy, hypertensive urgency and uremia.    Possibility of opioid overdose.  Patient underwent hemodialysis.  Ammonia within normal limits.  No leukocytosis to suggest infection.  Neurology was curbsided by the admitting provider who recommended MRI of the brain which showed no acute intracranial abnormality.  Was counseled against ambien usage. ?  ?Recent Subdural hematoma ([place]) ?From recent hospitalization.  CT head this time shows resolution of SDH.  MRI of the brain showing no acute intracranial abnormality ?   ?Uncontrolled HTN ?On presentation.  Elevated blood pressure to be  secondary to volume overload.  Continue Catapres.   History of noncompliance as well.   We will start p.o. antihypertensives including labetalol losartan and amlodipine on discharge.. ?  ?ESRD on dialysis [facility]) ?Patient is Monday Wednesday Friday hemodialysis schedule at his baseline.  Chest x-ray showed pulmonary edema.  Fluid management as per hemodialysis.  Patient is a scheduled for hemodialysis tomorrow. ? ?History of renal and pancreatic transplant ?-She has reported h/o noncompliance with immunosuppressants and has failed renal transplant.  Continue mycophenolate, cyclosporine.  Initially received Solu-Medrol instead of prednisone stress dosing due to limited p.o.  Will start on p.o prednisone on discharge ?  ?Type 1 diabetes ([place]) ?-History of pancreatic transplant and not on medication.  Last hemoglobin A1c was 4.1.   ?   ?Narcotic dependence ?Patient with history of chronic pain.  On opiates as outpatient.  Will resume narcotics on discharge prevent withdrawals. ?

## 2021-12-25 DIAGNOSIS — T8691 Unspecified transplanted organ and tissue rejection: Secondary | ICD-10-CM

## 2021-12-25 DIAGNOSIS — E1129 Type 2 diabetes mellitus with other diabetic kidney complication: Secondary | ICD-10-CM

## 2021-12-25 DIAGNOSIS — Z9483 Pancreas transplant status: Secondary | ICD-10-CM

## 2021-12-25 DIAGNOSIS — S065XAA Traumatic subdural hemorrhage with loss of consciousness status unknown, initial encounter: Secondary | ICD-10-CM

## 2021-12-25 LAB — BASIC METABOLIC PANEL
Anion gap: 17 — ABNORMAL HIGH (ref 5–15)
BUN: 58 mg/dL — ABNORMAL HIGH (ref 6–20)
CO2: 26 mmol/L (ref 22–32)
Calcium: 10.1 mg/dL (ref 8.9–10.3)
Chloride: 97 mmol/L — ABNORMAL LOW (ref 98–111)
Creatinine, Ser: 6.46 mg/dL — ABNORMAL HIGH (ref 0.44–1.00)
GFR, Estimated: 8 mL/min — ABNORMAL LOW (ref >60)
Glucose, Bld: 85 mg/dL (ref 70–99)
Potassium: 5.2 mmol/L — ABNORMAL HIGH (ref 3.5–5.1)
Sodium: 140 mmol/L (ref 135–145)

## 2021-12-25 LAB — CBC
HCT: 25.2 % — ABNORMAL LOW (ref 36.0–46.0)
Hemoglobin: 8.2 g/dL — ABNORMAL LOW (ref 12.0–15.0)
MCH: 30.8 pg (ref 26.0–34.0)
MCHC: 32.5 g/dL (ref 30.0–36.0)
MCV: 94.7 fL (ref 80.0–100.0)
Platelets: 184 10*3/uL (ref 150–400)
RBC: 2.66 MIL/uL — ABNORMAL LOW (ref 3.87–5.11)
RDW: 14.3 % (ref 11.5–15.5)
WBC: 5.4 10*3/uL (ref 4.0–10.5)
nRBC: 0 % (ref 0.0–0.2)

## 2021-12-25 LAB — MAGNESIUM: Magnesium: 2.6 mg/dL — ABNORMAL HIGH (ref 1.7–2.4)

## 2021-12-25 MED ORDER — NALOXONE HCL 4 MG/0.1ML NA LIQD: 1.0000 | NASAL | Status: DC | PRN

## 2021-12-25 MED ORDER — CINACALCET HCL 30 MG PO TABS
60.0000 mg | ORAL_TABLET | ORAL | Status: DC
  Administered 2021-12-25: 60 mg via ORAL
  Filled 2021-12-25: qty 2

## 2021-12-25 MED ORDER — ASPIRIN EC 81 MG PO TBEC
81.0000 mg | DELAYED_RELEASE_TABLET | Freq: Every day | ORAL | Status: DC
  Administered 2021-12-25: 81 mg via ORAL
  Filled 2021-12-25: qty 1

## 2021-12-25 MED ORDER — OXYCODONE-ACETAMINOPHEN 5-325 MG PO TABS
1.0000 | ORAL_TABLET | Freq: Four times a day (QID) | ORAL | Status: DC | PRN
  Administered 2021-12-25: 2 via ORAL
  Filled 2021-12-25: qty 2

## 2021-12-25 MED ORDER — AMLODIPINE BESYLATE 10 MG PO TABS
10.0000 mg | ORAL_TABLET | Freq: Every day | ORAL | Status: DC
  Administered 2021-12-25: 10 mg via ORAL
  Filled 2021-12-25: qty 1

## 2021-12-25 MED ORDER — LOSARTAN POTASSIUM 50 MG PO TABS
100.0000 mg | ORAL_TABLET | Freq: Every day | ORAL | Status: DC
  Administered 2021-12-25: 100 mg via ORAL
  Filled 2021-12-25: qty 2

## 2021-12-25 MED ORDER — NALOXONE HCL 0.4 MG/ML IJ SOLN: 0.4000 mg | INTRAMUSCULAR | Status: DC | PRN

## 2021-12-25 MED ORDER — VITAMIN D (ERGOCALCIFEROL) 1.25 MG (50000 UNIT) PO CAPS
50000.0000 [IU] | ORAL_CAPSULE | ORAL | Status: DC
  Administered 2021-12-25: 50000 [IU] via ORAL
  Filled 2021-12-25: qty 1

## 2021-12-25 MED ORDER — CYCLOSPORINE MODIFIED (NEORAL) 100 MG PO CAPS: 200.0000 mg | ORAL_CAPSULE | Freq: Two times a day (BID) | ORAL | Status: DC

## 2021-12-25 MED ORDER — PREDNISONE 5 MG PO TABS
15.0000 mg | ORAL_TABLET | Freq: Every day | ORAL | Status: DC
  Administered 2021-12-25: 15 mg via ORAL
  Filled 2021-12-25: qty 3

## 2021-12-25 MED ORDER — LABETALOL HCL 200 MG PO TABS
600.0000 mg | ORAL_TABLET | Freq: Two times a day (BID) | ORAL | Status: DC
  Filled 2021-12-25: qty 3

## 2021-12-25 MED ORDER — FLUTICASONE PROPIONATE 50 MCG/ACT NA SUSP
1.0000 | Freq: Every day | NASAL | Status: DC | PRN
  Filled 2021-12-25: qty 16

## 2021-12-25 NOTE — Progress Notes (Signed)
After med frequency changed, went back over instructions with pt.  PIV removed and Pt. Has no further questions ?

## 2021-12-25 NOTE — Progress Notes (Signed)
?[facility] ?Progress Note  ? ?Subjective:   Patient seen and examined at bedside.  Back to baseline today.  Reports she stopped taking oxycodone about 2 months ago.  Admits she took an 1 Ambien around 12AM and another 1/2 Ambien around 2:30AM because she could not sleep.  Denies any other drugs or medications.  States she has been taking Ambien for a long time because of insomnia.  Feeling better today except for a headache.  Denies CP, SOB, abdominal pain and n/v/d.   ? ?Objective ?Vitals:  ? 12/25/21 0333 12/25/21 0500 12/25/21 0700 12/25/21 0815  ?BP: (!) 167/72   (!) 169/76  ?Pulse: 84  82 85  ?Resp: '15  11 18  '$ ?Temp:    97.8 ?F (36.6 ?C)  ?TempSrc:    Oral  ?SpO2: 100%  99% 100%  ?Weight:  51.1 kg    ? ?Physical Exam ?General:well appearing female in NAD ?Heart:RRR, no mrg ?Lungs:CTAB, nml WOB on RA ?Abdomen:soft, NTND ?Extremities:No LE edema ?Dialysis Access: RU AVF +b/t  ? ?Filed Weights  ? 12/24/21 0500 12/25/21 0500  ?Weight: 50.2 kg 51.1 kg  ? ? ?Intake/Output Summary (Last 24 hours) at 12/25/2021 1102 ?Last data filed at 12/25/2021 6578 ?Gross per 24 hour  ?Intake 843 ml  ?Output --  ?Net 843 ml  ? ? ?Additional Objective ?Labs: ?Basic Metabolic Panel: ?Recent Labs  ?Lab 12/23/21 ?4696 12/23/21 ?1003 12/23/21 ?1006 12/24/21 ?0230 12/25/21 ?2952  ?NA 140 137 138 139 140  ?K 5.9* 5.5* 5.4* 4.8 5.2*  ?CL 98 103  --  99 97*  ?CO2 25  --   --  26 26  ?GLUCOSE 97 99  --  107* 85  ?BUN 41* 43*  --  23* 58*  ?CREATININE 7.23* 8.30*  --  4.05* 6.46*  ?CALCIUM 10.3  --   --  10.0 10.1  ?PHOS  --   --   --  6.5*  --   ? ?Liver Function Tests: ?Recent Labs  ?Lab 12/23/21 ?8413 12/24/21 ?0230  ?AST 17 11*  ?ALT 13 11  ?ALKPHOS 93 81  ?BILITOT 0.8 1.3*  ?PROT 7.9 7.6  ?ALBUMIN 3.8 3.4*  ? ?Recent Labs  ?Lab 12/23/21 ?2440  ?LIPASE 45  ? ?CBC: ?Recent Labs  ?Lab 12/23/21 ?1013 12/24/21 ?0230 12/25/21 ?0352  ?WBC 7.4 5.4 5.4  ?NEUTROABS 5.8 4.1  --   ?HGB 8.8* 8.8* 8.2*  ?HCT 27.3* 26.5* 25.2*  ?MCV 94.8  92.7 94.7  ?PLT 169 183 184  ? ?Cardiac Enzymes: ?Recent Labs  ?Lab 12/23/21 ?1027  ?CKTOTAL 33*  ? ?CBG: ?Recent Labs  ?Lab 12/23/21 ?2536 12/23/21 ?1608 12/24/21 ?1558  ?GLUCAP 91 136* 99  ? ?Studies/Results: ?MR BRAIN WO CONTRAST ? ?Result Date: 12/24/2021 ?CLINICAL DATA:  Altered mental status EXAM: MRI HEAD WITHOUT CONTRAST TECHNIQUE: Multiplanar, multiecho pulse sequences of the brain and surrounding structures were obtained without intravenous contrast. COMPARISON:  Brain MRI 11/09/2021 FINDINGS: Brain: No acute infarct, mass effect or extra-axial collection. Areas of resolving chronic subdural hemorrhage over the frontal convexity. There is multifocal hyperintense T2-weighted signal within the white matter. Parenchymal volume and CSF spaces are normal. 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 The midline structures are normal. Vascular: Major flow voids are preserved. Skull and upper cervical spine: Normal calvarium and skull base. Visualized upper cervical spine and soft tissues are normal. Sinuses/Orbits:No paranasal sinus fluid levels or advanced mucosal thickening. No mastoid or middle ear effusion. Normal orbits. IMPRESSION: 1. No  acute intracranial abnormality. 2. 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 Electronically Signed   By: Ulyses Jarred M.D.   On: 12/24/2021 01:44   ? ?Medications: ? ? amLODipine  10 mg Oral Daily  ? aspirin EC  81 mg Oral Daily  ? Chlorhexidine Gluconate Cloth  6 each Topical Q0600  ? cinacalcet  60 mg Oral Once per day on Sun Tue Thu Sat  ? cloNIDine  0.2 mg Transdermal Weekly  ? cycloSPORINE modified  200 mg Oral BID  ? cycloSPORINE modified  25 mg Oral QPC lunch  ? Darbepoetin Alfa  60 mcg Intravenous Q Fri-HD  ? doxercalciferol  6 mcg Intravenous Q M,W,F-HD  ? ferric citrate  630 mg Oral TID  ? heparin  2,000 Units Dialysis Once in dialysis  ? heparin  5,000 Units Subcutaneous Q8H  ? labetalol  600 mg Oral BID  ?  losartan  100 mg Oral Daily  ? mycophenolate  540 mg Oral BID  ? pantoprazole  40 mg Oral Daily  ? predniSONE  15 mg Oral Daily  ? sodium chloride flush  3 mL Intravenous Q12H  ? sodium zirconium cyclosilicate  10 g Oral Q T,Th,S,Su  ? Vitamin D (Ergocalciferol)  50,000 Units Oral Weekly  ? ? ?Dialysis Orders: ?AF MWF ?3h 65mn  400/500   2/2 bath  RUE AVF  EDW 54kg 15g   ?No Heparin ?Sensipar '90mg'$  qHD ?Hectorol 931m qHD ?Mircera [id number] - last 4/19 ? - 4/02 > hep B Ag was neg and hep B Abs were > 10 (= protective)  ?  ?  ?Assessment/ Plan: ?AMS - here last month w/ similar issues. Back to baseline today.  MRI negative - SDH from last month resolved. Possibly from amb[place] stopping this medication.  ?ESRD - on HD MWF. Next HD on 4/24.  ?AHRF - due to vol overload w/ pulm edema on CXR. Improved.  Under edw, likely weight loss, needs lower dry.  ?HTN - on norvasc, catapres patch, losartan at home. Finally taking meds, BP coming down.  ?Hx kidney/ pancreas transplant - kidney tx failed, still on IS meds for pancreas ?Recent COViD infection - here for 12 days in march 2023.  ?Anemia ckd - Hb 8.2.  ESA recently dosed.  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 Hold VDRA, continue sensipar. Phos elevated, continue binders.  ?Dispo - to d/c home today ? ?LinJen MowA-C ?[facility] ?12/25/2021,11:02 AM ? LOS: 1 day  ? ? ?

## 2021-12-25 NOTE — Discharge Summary (Signed)
?Physician Discharge Summary ?  ?Patient: [name] [name] MRN: [id number] DOB: 03/10/1981  ?Admit date:     12/23/2021  ?Discharge date: 12/25/21  ?Discharge Physician: Corrie Mckusick Quintella Mura  ? ?PCP: [facility] [place]  ? ?Recommendations at discharge:  ? ?Follow-up with your primary care physician in 1 week.  ?Continue hemodialysis as outpatient. ?Patient should be monitored for polypharmacy ? ?Discharge Diagnoses: ?Principal Problem: ?  Acute encephalopathy ?Active Problems: ?  Subdural hematoma (HCC) ?  ESRD on dialysis [facility]) ?  History of renal transplant ?  Type 2 diabetes mellitus with other diabetic kidney complication (HCC) ?  Pancreas transplant status ([place]) ?  Narcotic dependence ([place]) ?  Transplant rejection ?  HTN (hypertension), malignant ?  Hypervolemia associated with renal insufficiency ? ?Resolved Problems: ?  * No resolved hospital problems. * ? ?Hospital Course: ?[name] [name] is a 42 y.o. 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 She was last admitted from 3/30-4/2.  When her friends checked her at home she was found to be unresponsive.  It looked like there was a substance, maybe vomited in her mouth.  Mention of chronic pain at baseline.  Patient had last gone for hemodialysis on Wednesday.  In the ED, patient was noted to have elevated blood pressure with hypoxia with saturation in the 80% range.  Chest x-ray showed volume overload.  CT head was negative.  Neurology was consulted who recommended MRI.  Patient was then admitted hospital for further evaluation and treatment.   ? ?Assessment and Plan: ? Principal Problem: ?  Acute encephalopathy ?Active Problems: ?  Subdural hematoma (HCC) ?  ESRD on dialysis [facility]) ?  History of renal transplant ?  Type 2  diabetes mellitus with other diabetic kidney complication (HCC) ?  Pancreas transplant status ([place]) ?  Narcotic dependence ([place]) ?  Transplant rejection ?  HTN (hypertension), malignant ?  Hypervolemia associated with renal insufficiency ?  ?Acute metabolic encephalopathy ?Likely multifactorial from polypharmacy, hypertensive urgency and uremia.    Possibility of opioid overdose.  Patient underwent hemodialysis.  Ammonia within normal limits.  No leukocytosis to suggest infection.  Neurology was curbsided by the admitting provider who recommended MRI of the brain which showed no acute intracranial abnormality.  Was counseled against ambien usage. ?  ?Recent Subdural hematoma ([place]) ?From recent hospitalization.  CT head this time shows resolution of SDH.  MRI of the brain showing no acute intracranial abnormality ?   ?Uncontrolled HTN ?On presentation.  Elevated blood pressure to be secondary to volume overload.  Continue Catapres.   History of noncompliance as well.   We will start p.o. antihypertensives including labetalol losartan and amlodipine on discharge.. ?  ?ESRD on dialysis [facility]) ?Patient is Monday Wednesday Friday hemodialysis schedule at his baseline.  Chest x-ray showed pulmonary edema.  Fluid management as per hemodialysis.  Patient is a scheduled for hemodialysis tomorrow. ? ?History of renal and pancreatic transplant ?-She has reported h/o noncompliance with immunosuppressants and has failed renal transplant.  Continue mycophenolate, cyclosporine.  Initially received Solu-Medrol instead of prednisone stress dosing due to limited p.o.  Will start on p.o prednisone on discharge ?  ?Type 1 diabetes ([place]) ?-History of pancreatic transplant and not on medication.  Last hemoglobin A1c was 4.1.   ?   ?Narcotic dependence ?Patient with history  of chronic pain.  On opiates as outpatient.  Will resume narcotics on discharge prevent withdrawals. ? ?Consultants: Nephrology ? ?Procedures performed:  Hemodialysis ? ?Disposition: Home ? ?Diet recommendation:  ?Discharge Diet Orders (From admission, onward)  ? ?  Start     Ordered  ? 12/25/21 0000  Diet - low sodium heart healthy       ?Comments: Fluid restriction 1200 MLS per day.  ? 12/25/21 0928  ? ?  ?  ? ?  ? ?Cardiac diet ?DISCHARGE MEDICATION: ?Allergies as of 12/25/2021   ? ?   Reactions  ? Iodinated Contrast Media   ? Other reaction(s): Unknown  ? ?  ? ?  ?Medication List  ?  ? ?STOP taking these medications   ? ?zolpidem 10 MG tablet ?Commonly known as: AMBIEN ?  ? ?  ? ?TAKE these medications   ? ?amLODipine 10 MG tablet ?Commonly known as: NORVASC ?Take 1 tablet (10 mg total) by mouth daily. ?  ?aspirin 81 MG chewable tablet ?Chew 81 mg by mouth daily. ?  ?Auryxia 1 GM 210 MG(Fe) tablet ?Generic drug: ferric citrate ?Take 630 mg by mouth 3 (three) times daily. ?  ?calcium acetate (Phos Binder) 667 MG/5ML Soln ?Commonly known as: PHOSLYRA ?Take 2,668 mg by mouth 3 (three) times daily with meals. Take 3 tablets with meals and 1 tablet with snacks ?  ?Catapres-TTS-1 0.1 mg/24hr patch ?Generic drug: cloNIDine ?Place 1 patch (0.1 mg total) onto the skin once a week. ?  ?colchicine 0.6 MG tablet ?Take 0.6 mg by mouth as needed. ?  ?cycloSPORINE modified 100 MG capsule ?Commonly known as: NEORAL ?Take 2 capsules (200 mg total) by mouth 2 (two) times daily. ?  ?Darbepoetin Alfa 60 MCG/0.3ML Sosy injection ?Commonly known as: ARANESP ?Inject 0.3 mLs (60 mcg total) into the vein every Friday with hemodialysis. ?  ?diphenhydrAMINE 25 MG tablet ?Commonly known as: BENADRYL ?Take 25 mg by mouth every 6 (six) hours as needed for itching. ?  ?doxercalciferol 4 MCG/2ML injection ?Commonly known as: HECTOROL ?Inject 3 mLs (6 mcg total) into the vein every Monday, Wednesday, and Friday with hemodialysis. ?  ?ergocalciferol 1.25 MG (50000 UT) capsule ?Commonly known as: VITAMIN D2 ?Take 50,000 Units by mouth once a week. ?  ?fluticasone 50 MCG/ACT nasal spray ?Commonly  known as: FLONASE ?Place 1 spray into both nostrils daily as needed for allergies. ?  ?furosemide 20 MG tablet ?Commonly known as: LASIX ?Take 20 mg by mouth 4 (four) times a week. Take on non-HD days ?  ?lidocaine-prilocaine cream ?Commonly known as: EMLA ?Apply 1 application topically daily as needed (port access). ?  ?Lokelma 10 g Pack packet ?Generic drug: sodium zirconium cyclosilicate ?Take 10 g by mouth as directed. Take on Non-Dialysis (Sun,Tues, Thurs, Sat) ?  ?losartan 100 MG tablet ?Commonly known as: Cozaar ?Take 1 tablet (100 mg total) by mouth daily. ?  ?MIRCERA IJ ?Mircera ?  ?mycophenolate 180 MG EC tablet ?Commonly known as: MYFORTIC ?Take 540 mg by mouth 2 (two) times daily. ?  ?naloxone 4 MG/0.1ML Liqd nasal spray kit ?Commonly known as: NARCAN ?Place 1 spray into the nose as needed (opioid overdose). ?  ?ondansetron 4 MG tablet ?Commonly known as: ZOFRAN ?Take 4 mg by mouth daily as needed for nausea. ?  ?oxyCODONE-acetaminophen 5-325 MG tablet ?Commonly known as: PERCOCET/ROXICET ?Take 1-2 tablets by mouth every 6 (six) hours as needed for severe pain. ?  ?predniSONE 5 MG tablet ?Commonly known as: DELTASONE ?Take 15  mg by mouth daily. ?  ? ?  ? ?Discharge Exam: ?Filed Weights  ? 12/24/21 0500 12/25/21 0500  ?Weight: 50.2 kg 51.1 kg  ?Body mass index is 19.34 kg/m?.  ? ?General: Thinly built, not in obvious distress ?HENT:   No scleral pallor or icterus noted. Oral mucosa is moist.  ?Chest:  Clear breath sounds.  Diminished breath sounds bilaterally. No crackles or wheezes.  ?CVS: S1 &S2 heard. No murmur.  Regular rate and rhythm. ?Abdomen: Soft, nontender, nondistended.  Bowel sounds are heard.   ?Extremities: No cyanosis, clubbing or edema.  Peripheral pulses are palpable.  Right upper extremity hemodialysis access ?Psych: Alert, awake and oriented, normal mood ?CNS:  No cranial nerve deficits.  Power equal in all extremities.   ?Skin: Warm and dry.  No rashes noted. ? ? ?Condition at  discharge: good ? ?The results of significant diagnostics from this hospitalization (including imaging, microbiology, ancillary and laboratory) are listed below for reference.  ? ?Imaging Studies: ?CT Abdomen Pelvis Wo Contras

## 2021-12-26 ENCOUNTER — Telehealth: Payer: Self-pay | Admitting: Nephrology

## 2021-12-26 ENCOUNTER — Telehealth: Payer: Medicare Other | Admitting: Internal Medicine

## 2021-12-26 NOTE — Telephone Encounter (Signed)
Transition of Care Contact from Inpatient Facility ? ?Date of Discharge: 12/25/21 ?Date of Contact: 12/26/21 ?Method of contact: phone - attempted ? ?Attempted to contact patient to discuss transition of care from inpatient admission.  Patient did not answer the phone.  Will follow up with patient at dialysis.  ? ?Jen Mow, PA-C ?[facility] ?Pager: [phone] ? ?

## 2021-12-27 ENCOUNTER — Telehealth: Payer: Self-pay

## 2021-12-27 NOTE — Telephone Encounter (Signed)
Attempted to contact patient to reschedule a Palliative Care consult appointment. No answer left a message to return call. Patient has 2 no show consults.  ?

## 2021-12-28 LAB — CULTURE, BLOOD (ROUTINE X 2)
Culture: NO GROWTH
Culture: NO GROWTH
Special Requests: ADEQUATE
Special Requests: ADEQUATE

## 2022-01-02 ENCOUNTER — Telehealth: Payer: Self-pay

## 2022-01-02 NOTE — Telephone Encounter (Signed)
Attempted to contact patient to reschedule a Palliative Care consult appointment. No answer left a message to return call.  

## 2022-01-06 ENCOUNTER — Inpatient Hospital Stay (HOSPITAL_COMMUNITY)
Admission: EM | Admit: 2022-01-06 | Discharge: 2022-01-10 | DRG: 077 | Discharge status: Against Medical Advice | Payer: Medicare Other | Attending: Critical Care Medicine | Admitting: Critical Care Medicine

## 2022-01-06 ENCOUNTER — Other Ambulatory Visit: Payer: Self-pay

## 2022-01-06 ENCOUNTER — Encounter (HOSPITAL_COMMUNITY): Payer: Self-pay

## 2022-01-06 DIAGNOSIS — I10 Essential (primary) hypertension: Secondary | ICD-10-CM | POA: Diagnosis present

## 2022-01-06 DIAGNOSIS — T8612 Kidney transplant failure: Secondary | ICD-10-CM | POA: Diagnosis present

## 2022-01-06 DIAGNOSIS — Z91199 Patient's noncompliance with other medical treatment and regimen due to unspecified reason: Secondary | ICD-10-CM

## 2022-01-06 DIAGNOSIS — F1120 Opioid dependence, uncomplicated: Secondary | ICD-10-CM | POA: Diagnosis present

## 2022-01-06 DIAGNOSIS — E8770 Fluid overload, unspecified: Secondary | ICD-10-CM | POA: Diagnosis present

## 2022-01-06 DIAGNOSIS — I674 Hypertensive encephalopathy: Principal | ICD-10-CM | POA: Diagnosis present

## 2022-01-06 DIAGNOSIS — E1043 Type 1 diabetes mellitus with diabetic autonomic (poly)neuropathy: Secondary | ICD-10-CM | POA: Diagnosis present

## 2022-01-06 DIAGNOSIS — Z79899 Other long term (current) drug therapy: Secondary | ICD-10-CM

## 2022-01-06 DIAGNOSIS — G928 Other toxic encephalopathy: Secondary | ICD-10-CM | POA: Diagnosis present

## 2022-01-06 DIAGNOSIS — I120 Hypertensive chronic kidney disease with stage 5 chronic kidney disease or end stage renal disease: Secondary | ICD-10-CM | POA: Diagnosis present

## 2022-01-06 DIAGNOSIS — D631 Anemia in chronic kidney disease: Secondary | ICD-10-CM | POA: Diagnosis present

## 2022-01-06 DIAGNOSIS — Z5329 Procedure and treatment not carried out because of patient's decision for other reasons: Secondary | ICD-10-CM | POA: Diagnosis not present

## 2022-01-06 DIAGNOSIS — Z91158 Patient's noncompliance with renal dialysis for other reason: Secondary | ICD-10-CM

## 2022-01-06 DIAGNOSIS — N186 End stage renal disease: Secondary | ICD-10-CM | POA: Diagnosis present

## 2022-01-06 DIAGNOSIS — Z91041 Radiographic dye allergy status: Secondary | ICD-10-CM

## 2022-01-06 DIAGNOSIS — Z7952 Long term (current) use of systemic steroids: Secondary | ICD-10-CM

## 2022-01-06 DIAGNOSIS — R4182 Altered mental status, unspecified: Principal | ICD-10-CM

## 2022-01-06 DIAGNOSIS — T451X5A Adverse effect of antineoplastic and immunosuppressive drugs, initial encounter: Secondary | ICD-10-CM | POA: Diagnosis present

## 2022-01-06 DIAGNOSIS — E1129 Type 2 diabetes mellitus with other diabetic kidney complication: Secondary | ICD-10-CM | POA: Diagnosis present

## 2022-01-06 DIAGNOSIS — Z8249 Family history of ischemic heart disease and other diseases of the circulatory system: Secondary | ICD-10-CM

## 2022-01-06 DIAGNOSIS — Z7982 Long term (current) use of aspirin: Secondary | ICD-10-CM

## 2022-01-06 DIAGNOSIS — D696 Thrombocytopenia, unspecified: Secondary | ICD-10-CM | POA: Diagnosis present

## 2022-01-06 DIAGNOSIS — I6783 Posterior reversible encephalopathy syndrome: Secondary | ICD-10-CM | POA: Diagnosis present

## 2022-01-06 DIAGNOSIS — R278 Other lack of coordination: Secondary | ICD-10-CM | POA: Diagnosis not present

## 2022-01-06 DIAGNOSIS — Z8616 Personal history of COVID-19: Secondary | ICD-10-CM

## 2022-01-06 DIAGNOSIS — G9340 Encephalopathy, unspecified: Secondary | ICD-10-CM | POA: Diagnosis present

## 2022-01-06 DIAGNOSIS — E1022 Type 1 diabetes mellitus with diabetic chronic kidney disease: Secondary | ICD-10-CM | POA: Diagnosis present

## 2022-01-06 DIAGNOSIS — Z9483 Pancreas transplant status: Secondary | ICD-10-CM

## 2022-01-06 DIAGNOSIS — Y830 Surgical operation with transplant of whole organ as the cause of abnormal reaction of the patient, or of later complication, without mention of misadventure at the time of the procedure: Secondary | ICD-10-CM | POA: Diagnosis present

## 2022-01-06 DIAGNOSIS — I161 Hypertensive emergency: Secondary | ICD-10-CM | POA: Diagnosis present

## 2022-01-06 DIAGNOSIS — F419 Anxiety disorder, unspecified: Secondary | ICD-10-CM | POA: Diagnosis present

## 2022-01-06 DIAGNOSIS — R000 Tachycardia, unspecified: Secondary | ICD-10-CM | POA: Diagnosis present

## 2022-01-06 DIAGNOSIS — K3184 Gastroparesis: Secondary | ICD-10-CM | POA: Diagnosis present

## 2022-01-06 DIAGNOSIS — E875 Hyperkalemia: Secondary | ICD-10-CM | POA: Diagnosis present

## 2022-01-06 DIAGNOSIS — Z87891 Personal history of nicotine dependence: Secondary | ICD-10-CM

## 2022-01-06 DIAGNOSIS — D849 Immunodeficiency, unspecified: Secondary | ICD-10-CM | POA: Diagnosis present

## 2022-01-06 DIAGNOSIS — Z781 Physical restraint status: Secondary | ICD-10-CM

## 2022-01-06 DIAGNOSIS — Z992 Dependence on renal dialysis: Secondary | ICD-10-CM

## 2022-01-06 DIAGNOSIS — F23 Brief psychotic disorder: Secondary | ICD-10-CM | POA: Diagnosis present

## 2022-01-06 DIAGNOSIS — H547 Unspecified visual loss: Secondary | ICD-10-CM | POA: Diagnosis present

## 2022-01-06 LAB — CBC
HCT: 31.3 % — ABNORMAL LOW (ref 36.0–46.0)
Hemoglobin: 10 g/dL — ABNORMAL LOW (ref 12.0–15.0)
MCH: 31 pg (ref 26.0–34.0)
MCHC: 31.9 g/dL (ref 30.0–36.0)
MCV: 96.9 fL (ref 80.0–100.0)
Platelets: 153 10*3/uL (ref 150–400)
RBC: 3.23 MIL/uL — ABNORMAL LOW (ref 3.87–5.11)
RDW: 14.8 % (ref 11.5–15.5)
WBC: 5 10*3/uL (ref 4.0–10.5)
nRBC: 0 % (ref 0.0–0.2)

## 2022-01-06 LAB — COMPREHENSIVE METABOLIC PANEL
ALT: 11 U/L (ref 0–44)
AST: 11 U/L — ABNORMAL LOW (ref 15–41)
Albumin: 3.5 g/dL (ref 3.5–5.0)
Alkaline Phosphatase: 68 U/L (ref 38–126)
Anion gap: 14 (ref 5–15)
BUN: 47 mg/dL — ABNORMAL HIGH (ref 6–20)
CO2: 26 mmol/L (ref 22–32)
Calcium: 9.7 mg/dL (ref 8.9–10.3)
Chloride: 97 mmol/L — ABNORMAL LOW (ref 98–111)
Creatinine, Ser: 8.84 mg/dL — ABNORMAL HIGH (ref 0.44–1.00)
GFR, Estimated: 5 mL/min — ABNORMAL LOW (ref >60)
Glucose, Bld: 84 mg/dL (ref 70–99)
Potassium: 6 mmol/L — ABNORMAL HIGH (ref 3.5–5.1)
Sodium: 137 mmol/L (ref 135–145)
Total Bilirubin: 0.8 mg/dL (ref 0.3–1.2)
Total Protein: 7.1 g/dL (ref 6.5–8.1)

## 2022-01-06 LAB — CBG MONITORING, ED: Glucose-Capillary: 78 mg/dL (ref 70–99)

## 2022-01-06 MED ORDER — PENTAFLUOROPROP-TETRAFLUOROETH EX AERO: 1.0000 "application " | INHALATION_SPRAY | CUTANEOUS | Status: DC | PRN

## 2022-01-06 MED ORDER — SODIUM CHLORIDE 0.9 % IV SOLN: 100.0000 mL | INTRAVENOUS | Status: DC | PRN

## 2022-01-06 MED ORDER — CALCIUM GLUCONATE-NACL 1-0.675 GM/50ML-% IV SOLN
1.0000 g | Freq: Once | INTRAVENOUS | Status: AC
  Administered 2022-01-06: 1000 mg via INTRAVENOUS
  Filled 2022-01-06: qty 50

## 2022-01-06 MED ORDER — HEPARIN SODIUM (PORCINE) 1000 UNIT/ML DIALYSIS: 1000.0000 [IU] | INTRAMUSCULAR | Status: DC | PRN

## 2022-01-06 MED ORDER — LIDOCAINE-PRILOCAINE 2.5-2.5 % EX CREA: 1.0000 "application " | TOPICAL_CREAM | CUTANEOUS | Status: DC | PRN

## 2022-01-06 MED ORDER — INSULIN ASPART 100 UNIT/ML IJ SOLN
5.0000 [IU] | Freq: Once | INTRAMUSCULAR | Status: AC
  Administered 2022-01-06: 5 [IU] via INTRAVENOUS

## 2022-01-06 MED ORDER — ALTEPLASE 2 MG IJ SOLR
2.0000 mg | Freq: Once | INTRAMUSCULAR | Status: DC | PRN
  Filled 2022-01-06: qty 2

## 2022-01-06 MED ORDER — LIDOCAINE HCL (PF) 1 % IJ SOLN
5.0000 mL | INTRAMUSCULAR | Status: DC | PRN
  Filled 2022-01-06: qty 5

## 2022-01-06 MED ORDER — SODIUM ZIRCONIUM CYCLOSILICATE 10 G PO PACK
10.0000 g | PACK | Freq: Once | ORAL | Status: AC
  Administered 2022-01-06: 10 g via ORAL
  Filled 2022-01-06: qty 1

## 2022-01-06 MED ORDER — CHLORHEXIDINE GLUCONATE CLOTH 2 % EX PADS
6.0000 | MEDICATED_PAD | Freq: Every day | CUTANEOUS | Status: DC
  Administered 2022-01-08 – 2022-01-10 (×3): 6 via TOPICAL

## 2022-01-06 MED ORDER — DEXTROSE 50 % IV SOLN
1.0000 | Freq: Once | INTRAVENOUS | Status: AC
  Administered 2022-01-06: 50 mL via INTRAVENOUS
  Filled 2022-01-06: qty 50

## 2022-01-06 NOTE — ED Provider Notes (Signed)
MSE  ? ?AMS, hx of recent admission SDH, ESRD hx of transplant, DM, narcotic depndence, htn malignant ? ? ?HyperK 6 ?Meds ordered, discussed with Nephrology ? ?Ordered ingestion labs/CT head given hx ? ?Care to be continued by next provider.  ? ?  ?Gareth Morgan, MD ?01/06/22 2248 ? ?

## 2022-01-06 NOTE — ED Triage Notes (Signed)
Pt brought in POV. No family in lobby. Pt follows commands but does not answer triage questions. Will answer orientation questions and is A&OX4. SBP in the 200s. Pt states she missed dialysis but is unable state last dialysis. ?

## 2022-01-06 NOTE — ED Provider Notes (Signed)
?[facility] ?Provider Note ? ? ?CSN: [id number] ?Arrival date & time: 01/06/22  2003 ? ?  ? ?History ? ?Chief Complaint  ?Patient presents with  ? Altered Mental Status  ? ? ?[name] [name] is a 41 y.o. female. ? ?41 year old female presented alone today in triage without family with change in mental status.  Patient is alert to name and knows date of birth, otherwise will answer yes to some questions but not consistent.  Has a history of ESRD with transplant (renal and pancreatic, renal rejected), diabetes, narcotic dependence, hypertension, chronic subdural hemorrhage with recent admission for altered mental status thought to be possibly due to polypharmacy versus hypertensive urgency versus uremia versus opioid overdose. ?Level 5 caveat as patient is unable to provide any history. ? ? ?  ? ?Home Medications ?Prior to Admission medications   ?Medication Sig Start Date End Date Taking? Authorizing Provider  ?amLODipine (NORVASC) 10 MG tablet Take 1 tablet (10 mg total) by mouth daily. 12/05/21 03/05/22  [place] Eric J, DO  ?aspirin 81 MG chewable tablet Chew 81 mg by mouth daily.    [provider]  ?AURYXIA 1 GM 210 MG(Fe) tablet Take 630 mg by mouth 3 (three) times daily. 12/09/21   [provider]  ?calcium acetate, Phos Binder, (PHOSLYRA) 667 MG/5ML SOLN Take 2,668 mg by mouth 3 (three) times daily with meals. Take 3 tablets with meals and 1 tablet with snacks    [provider]  ?CATAPRES-TTS-1 0.1 MG/24HR patch Place 1 patch (0.1 mg total) onto the skin once a week. 12/04/21 03/04/22  [place] Eric J, DO  ?colchicine 0.6 MG tablet Take 0.6 mg by mouth as needed.    [provider]  ?cycloSPORINE modified (NEORAL) 100 MG capsule Take 2 capsules (200 mg total) by mouth 2 (two) times daily. 12/25/21   Pokhrel, Corrie Mckusick, MD  ?Darbepoetin Alfa (ARANESP) 60 MCG/0.3ML SOSY injection Inject 0.3 mLs (60 mcg total) into the vein every Friday with  hemodialysis. 11/18/21   Cherene Altes, MD  ?diphenhydrAMINE (BENADRYL) 25 MG tablet Take 25 mg by mouth every 6 (six) hours as needed for itching.    [provider]  ?doxercalciferol (HECTOROL) 4 MCG/2ML injection Inject 3 mLs (6 mcg total) into the vein every Monday, Wednesday, and Friday with hemodialysis. 11/17/21   Cherene Altes, MD  ?ergocalciferol (VITAMIN D2) 1.25 MG (50000 UT) capsule Take 50,000 Units by mouth once a week.    [provider]  ?fluticasone (FLONASE) 50 MCG/ACT nasal spray Place 1 spray into both nostrils daily as needed for allergies. 11/24/21   [provider]  ?furosemide (LASIX) 20 MG tablet Take 20 mg by mouth 4 (four) times a week. Take on non-HD days    [provider]  ?lidocaine-prilocaine (EMLA) cream Apply 1 application topically daily as needed (port access). 07/09/20   [provider]  ?losartan (COZAAR) 100 MG tablet Take 1 tablet (100 mg total) by mouth daily. 12/04/21 03/04/22  [place] Eric J, DO  ?mycophenolate (MYFORTIC) 180 MG EC tablet Take 540 mg by mouth 2 (two) times daily.    [provider]  ?naloxone (NARCAN) nasal spray 4 mg/0.1 mL Place 1 spray into the nose as needed (opioid overdose).    [provider]  ?ondansetron (ZOFRAN) 4 MG tablet Take 4 mg by mouth daily as needed for nausea. 12/07/21   [provider]  ?oxyCODONE-acetaminophen (PERCOCET/ROXICET) 5-325 MG tablet Take 1-2 tablets by mouth every  6 (six) hours as needed for severe pain. ?Patient not taking: Reported on 12/25/2021 03/22/21   [name], MD  ?predniSONE (DELTASONE) 5 MG tablet Take 15 mg by mouth daily. 06/24/15   [provider]  ?sodium zirconium cyclosilicate (LOKELMA) 10 g PACK packet Take 10 g by mouth as directed. Take on Non-Dialysis (Sun,Tues, Thurs, Sat)    [provider]  ?   ? ?Allergies    ?Iodinated contrast media   ? ?Review of Systems   ?Review of Systems ?Negative except as per  HPI ?Physical Exam ?Updated Vital Signs ?BP (!) 204/93   Pulse 100   Temp 99.5 ?F (37.5 ?C) (Oral)   Resp 13   Ht '5\' 4"'$  (1.626 m)   Wt 51 kg   SpO2 92%   BMI 19.30 kg/m?  ?Physical Exam ?Vitals and nursing note reviewed.  ?Constitutional:   ?   General: She is not in acute distress. ?   Appearance: She is well-developed. She is not diaphoretic.  ?HENT:  ?   Head: Normocephalic and atraumatic.  ?   Nose: Nose normal.  ?   Mouth/Throat:  ?   Mouth: Mucous membranes are moist.  ?Eyes:  ?   Conjunctiva/sclera: Conjunctivae normal.  ?   Comments: Pinpoint pupils  ?Cardiovascular:  ?   Rate and Rhythm: Normal rate and regular rhythm.  ?   Heart sounds: Murmur heard.  ?Systolic murmur is present.  ?Pulmonary:  ?   Effort: Pulmonary effort is normal.  ?   Breath sounds: Normal breath sounds.  ?Abdominal:  ?   Palpations: Abdomen is soft.  ?   Tenderness: There is no abdominal tenderness.  ?Musculoskeletal:  ?   Cervical back: Neck supple.  ?   Right lower leg: No edema.  ?   Left lower leg: No edema.  ?Skin: ?   General: Skin is warm and dry.  ?   Findings: No erythema or rash.  ?Neurological:  ?   Mental Status: She is alert.  ?   GCS: GCS eye subscore is 4. GCS verbal subscore is 4. GCS motor subscore is 6.  ?   Comments: Alert to name, able to state her date of birth otherwise answers yes to simple questions.  Moves all 4 extremities, follows simple commands  ?Psychiatric:     ?   Behavior: Behavior normal.  ? ? ?ED Results / Procedures / Treatments   ?Labs ?(all labs ordered are listed, but only abnormal results are displayed) ?Labs Reviewed  ?CBC - Abnormal; Notable for the following components:  ?    Result Value  ? RBC 3.23 (*)   ? Hemoglobin 10.0 (*)   ? HCT 31.3 (*)   ? All other components within normal limits  ?COMPREHENSIVE METABOLIC PANEL - Abnormal; Notable for the following components:  ? Potassium 6.0 (*)   ? Chloride 97 (*)   ? BUN 47 (*)   ? Creatinine, Ser 8.84 (*)   ? AST 11 (*)   ? GFR, Estimated  5 (*)   ? All other components within normal limits  ?ACETAMINOPHEN LEVEL - Abnormal; Notable for the following components:  ? Acetaminophen (Tylenol), Serum <10 (*)   ? All other components within normal limits  ?RENAL FUNCTION PANEL - Abnormal; Notable for the following components:  ? Potassium 5.4 (*)   ? Glucose, Bld 161 (*)   ? BUN 48 (*)   ? Creatinine, Ser 9.01 (*)   ? Phosphorus 5.3 (*)   ?  Albumin 3.2 (*)   ? GFR, Estimated 5 (*)   ? All other components within normal limits  ?CBC - Abnormal; Notable for the following components:  ? RBC 2.99 (*)   ? Hemoglobin 9.2 (*)   ? HCT 29.9 (*)   ? Platelets 135 (*)   ? All other components within normal limits  ?HEPATITIS B SURFACE ANTIBODY,QUALITATIVE - Abnormal; Notable for the following components:  ? Hep B S Ab Reactive (*)   ? All other components within normal limits  ?BASIC METABOLIC PANEL - Abnormal; Notable for the following components:  ? Chloride 97 (*)   ? BUN 50 (*)   ? Creatinine, Ser 9.57 (*)   ? GFR, Estimated 5 (*)   ? Anion gap 17 (*)   ? All other components within normal limits  ?CBC - Abnormal; Notable for the following components:  ? RBC 2.99 (*)   ? Hemoglobin 9.3 (*)   ? HCT 29.0 (*)   ? Platelets 138 (*)   ? All other components within normal limits  ?I-STAT BETA HCG BLOOD, ED ([facility], [facility], [facility] ONLY) - Abnormal; Notable for the following components:  ? I-stat hCG, quantitative 14.4 (*)   ? All other components within normal limits  ?ETHANOL  ?HEPATITIS B SURFACE ANTIGEN  ?AMMONIA  ?RAPID URINE DRUG SCREEN, HOSP PERFORMED  ?HEPATITIS B SURFACE ANTIBODY, QUANTITATIVE  ?CBG MONITORING, ED  ? ? ?EKG ?None ? ?Radiology ?CT Head Wo Contrast ? ?Result Date: 01/07/2022 ?CLINICAL DATA:  Altered mental status.  Missed dialysis appointment. EXAM: CT HEAD WITHOUT CONTRAST TECHNIQUE: Contiguous axial images were obtained from the base of the skull through the vertex without intravenous contrast. RADIATION DOSE REDUCTION: This exam was performed according to the  departmental dose-optimization program which includes automated exposure control, adjustment of the mA and/or kV according to patient size and/or use of iterative reconstruction technique. COMPARISON:  Head CT 04/

## 2022-01-06 NOTE — ED Provider Triage Note (Signed)
Emergency Medicine Provider Triage Evaluation Note ? ?[name] [name] [id number] , a 41 y.o. female  was evaluated in triage.  Pt complains of altered mental status.  Patient presents alone, has no family in the lobby.  Patient states that she is dialysis patient, goes on Wednesdays, Fridays and Sundays.  Patient reports last session was on Wednesday and she had a full session.  Patient alert and oriented x2 in triage.  Intermittently follows commands.  Patient responds yes to each question asked. ? ?Review of Systems  ?Positive:  ?Negative:  ? ?Physical Exam  ?BP (!) 209/93   Pulse (!) 103   Temp 99.5 ?F (37.5 ?C) (Oral)   Resp 16   Ht '5\' 4"'$  (1.626 m)   Wt 51 kg   SpO2 96%   BMI 19.30 kg/m?  ?Gen:   Awake, no distress   ?Resp:  Normal effort  ?MSK:   Moves extremities without difficulty  ?Other:   ? ?Medical Decision Making  ?Medically screening exam initiated at 9:16 PM.  Appropriate orders placed.  [name] [name] was informed that the remainder of the evaluation will be completed by another provider, this initial triage assessment does not replace that evaluation, and the importance of remaining in the ED until their evaluation is complete. ? ? ?  ?Azucena Cecil, PA-C ?01/06/22 2116 ? ?

## 2022-01-07 ENCOUNTER — Encounter (HOSPITAL_COMMUNITY): Payer: Self-pay | Admitting: Family Medicine

## 2022-01-07 ENCOUNTER — Emergency Department (HOSPITAL_COMMUNITY): Payer: Medicare Other

## 2022-01-07 DIAGNOSIS — Z992 Dependence on renal dialysis: Secondary | ICD-10-CM | POA: Diagnosis not present

## 2022-01-07 DIAGNOSIS — D631 Anemia in chronic kidney disease: Secondary | ICD-10-CM | POA: Diagnosis present

## 2022-01-07 DIAGNOSIS — E1043 Type 1 diabetes mellitus with diabetic autonomic (poly)neuropathy: Secondary | ICD-10-CM | POA: Diagnosis present

## 2022-01-07 DIAGNOSIS — I674 Hypertensive encephalopathy: Secondary | ICD-10-CM | POA: Diagnosis present

## 2022-01-07 DIAGNOSIS — T8612 Kidney transplant failure: Secondary | ICD-10-CM | POA: Diagnosis present

## 2022-01-07 DIAGNOSIS — D696 Thrombocytopenia, unspecified: Secondary | ICD-10-CM | POA: Diagnosis present

## 2022-01-07 DIAGNOSIS — I10 Essential (primary) hypertension: Secondary | ICD-10-CM

## 2022-01-07 DIAGNOSIS — H547 Unspecified visual loss: Secondary | ICD-10-CM | POA: Diagnosis present

## 2022-01-07 DIAGNOSIS — D849 Immunodeficiency, unspecified: Secondary | ICD-10-CM | POA: Diagnosis not present

## 2022-01-07 DIAGNOSIS — E1129 Type 2 diabetes mellitus with other diabetic kidney complication: Secondary | ICD-10-CM

## 2022-01-07 DIAGNOSIS — F1120 Opioid dependence, uncomplicated: Secondary | ICD-10-CM

## 2022-01-07 DIAGNOSIS — E875 Hyperkalemia: Secondary | ICD-10-CM | POA: Diagnosis present

## 2022-01-07 DIAGNOSIS — I6783 Posterior reversible encephalopathy syndrome: Secondary | ICD-10-CM | POA: Diagnosis present

## 2022-01-07 DIAGNOSIS — Z5329 Procedure and treatment not carried out because of patient's decision for other reasons: Secondary | ICD-10-CM | POA: Diagnosis not present

## 2022-01-07 DIAGNOSIS — G9340 Encephalopathy, unspecified: Secondary | ICD-10-CM

## 2022-01-07 DIAGNOSIS — I161 Hypertensive emergency: Secondary | ICD-10-CM | POA: Diagnosis present

## 2022-01-07 DIAGNOSIS — E8770 Fluid overload, unspecified: Secondary | ICD-10-CM | POA: Diagnosis present

## 2022-01-07 DIAGNOSIS — F23 Brief psychotic disorder: Secondary | ICD-10-CM | POA: Diagnosis present

## 2022-01-07 DIAGNOSIS — G928 Other toxic encephalopathy: Secondary | ICD-10-CM | POA: Diagnosis present

## 2022-01-07 DIAGNOSIS — Z8616 Personal history of COVID-19: Secondary | ICD-10-CM | POA: Diagnosis not present

## 2022-01-07 DIAGNOSIS — Y830 Surgical operation with transplant of whole organ as the cause of abnormal reaction of the patient, or of later complication, without mention of misadventure at the time of the procedure: Secondary | ICD-10-CM | POA: Diagnosis present

## 2022-01-07 DIAGNOSIS — I120 Hypertensive chronic kidney disease with stage 5 chronic kidney disease or end stage renal disease: Secondary | ICD-10-CM | POA: Diagnosis present

## 2022-01-07 DIAGNOSIS — N186 End stage renal disease: Secondary | ICD-10-CM | POA: Diagnosis present

## 2022-01-07 DIAGNOSIS — Z91158 Patient's noncompliance with renal dialysis for other reason: Secondary | ICD-10-CM | POA: Diagnosis not present

## 2022-01-07 DIAGNOSIS — K3184 Gastroparesis: Secondary | ICD-10-CM | POA: Diagnosis present

## 2022-01-07 DIAGNOSIS — R4182 Altered mental status, unspecified: Secondary | ICD-10-CM | POA: Diagnosis not present

## 2022-01-07 DIAGNOSIS — Z9483 Pancreas transplant status: Secondary | ICD-10-CM | POA: Diagnosis not present

## 2022-01-07 DIAGNOSIS — Z781 Physical restraint status: Secondary | ICD-10-CM | POA: Diagnosis not present

## 2022-01-07 DIAGNOSIS — E1022 Type 1 diabetes mellitus with diabetic chronic kidney disease: Secondary | ICD-10-CM | POA: Diagnosis present

## 2022-01-07 LAB — BASIC METABOLIC PANEL
Anion gap: 17 — ABNORMAL HIGH (ref 5–15)
BUN: 50 mg/dL — ABNORMAL HIGH (ref 6–20)
CO2: 24 mmol/L (ref 22–32)
Calcium: 9.8 mg/dL (ref 8.9–10.3)
Chloride: 97 mmol/L — ABNORMAL LOW (ref 98–111)
Creatinine, Ser: 9.57 mg/dL — ABNORMAL HIGH (ref 0.44–1.00)
GFR, Estimated: 5 mL/min — ABNORMAL LOW (ref >60)
Glucose, Bld: 84 mg/dL (ref 70–99)
Potassium: 4.9 mmol/L (ref 3.5–5.1)
Sodium: 138 mmol/L (ref 135–145)

## 2022-01-07 LAB — CBC
HCT: 29 % — ABNORMAL LOW (ref 36.0–46.0)
HCT: 29.9 % — ABNORMAL LOW (ref 36.0–46.0)
Hemoglobin: 9.2 g/dL — ABNORMAL LOW (ref 12.0–15.0)
Hemoglobin: 9.3 g/dL — ABNORMAL LOW (ref 12.0–15.0)
MCH: 30.8 pg (ref 26.0–34.0)
MCH: 31.1 pg (ref 26.0–34.0)
MCHC: 30.8 g/dL (ref 30.0–36.0)
MCHC: 32.1 g/dL (ref 30.0–36.0)
MCV: 100 fL (ref 80.0–100.0)
MCV: 97 fL (ref 80.0–100.0)
Platelets: 135 10*3/uL — ABNORMAL LOW (ref 150–400)
Platelets: 138 10*3/uL — ABNORMAL LOW (ref 150–400)
RBC: 2.99 MIL/uL — ABNORMAL LOW (ref 3.87–5.11)
RBC: 2.99 MIL/uL — ABNORMAL LOW (ref 3.87–5.11)
RDW: 14.8 % (ref 11.5–15.5)
RDW: 14.8 % (ref 11.5–15.5)
WBC: 4.1 10*3/uL (ref 4.0–10.5)
WBC: 4.2 10*3/uL (ref 4.0–10.5)
nRBC: 0 % (ref 0.0–0.2)
nRBC: 0 % (ref 0.0–0.2)

## 2022-01-07 LAB — I-STAT BETA HCG BLOOD, ED (MC, WL, AP ONLY): I-stat hCG, quantitative: 14.4 m[IU]/mL — ABNORMAL HIGH (ref <5)

## 2022-01-07 LAB — RENAL FUNCTION PANEL
Albumin: 3.2 g/dL — ABNORMAL LOW (ref 3.5–5.0)
Anion gap: 15 (ref 5–15)
BUN: 48 mg/dL — ABNORMAL HIGH (ref 6–20)
CO2: 23 mmol/L (ref 22–32)
Calcium: 9.7 mg/dL (ref 8.9–10.3)
Chloride: 100 mmol/L (ref 98–111)
Creatinine, Ser: 9.01 mg/dL — ABNORMAL HIGH (ref 0.44–1.00)
GFR, Estimated: 5 mL/min — ABNORMAL LOW (ref >60)
Glucose, Bld: 161 mg/dL — ABNORMAL HIGH (ref 70–99)
Phosphorus: 5.3 mg/dL — ABNORMAL HIGH (ref 2.5–4.6)
Potassium: 5.4 mmol/L — ABNORMAL HIGH (ref 3.5–5.1)
Sodium: 138 mmol/L (ref 135–145)

## 2022-01-07 LAB — ACETAMINOPHEN LEVEL: Acetaminophen (Tylenol), Serum: <10 ug/mL — ABNORMAL LOW (ref 10–30)

## 2022-01-07 LAB — AMMONIA: Ammonia: 19 umol/L (ref 9–35)

## 2022-01-07 LAB — GLUCOSE, CAPILLARY: Glucose-Capillary: 100 mg/dL — ABNORMAL HIGH (ref 70–99)

## 2022-01-07 LAB — HEPATITIS B SURFACE ANTIBODY,QUALITATIVE: Hep B S Ab: REACTIVE — AB

## 2022-01-07 LAB — ETHANOL: Alcohol, Ethyl (B): <10 mg/dL (ref <10)

## 2022-01-07 LAB — HEPATITIS B SURFACE ANTIGEN: Hepatitis B Surface Ag: NONREACTIVE

## 2022-01-07 IMAGING — CT CT HEAD W/O CM
4 series · 16 of 47 positions shown, 18 images · non-contrast
Comparison: Head CT [DATE], MRI brain [DATE].

CLINICAL DATA: Altered mental status.  Missed dialysis appointment.



[Series 5: head without · axial · non-contrast · 0.41mm/px · z∈[-136,-22]mm · 7 of 31 slices shown, 9 images]
[im 4/31  brain]
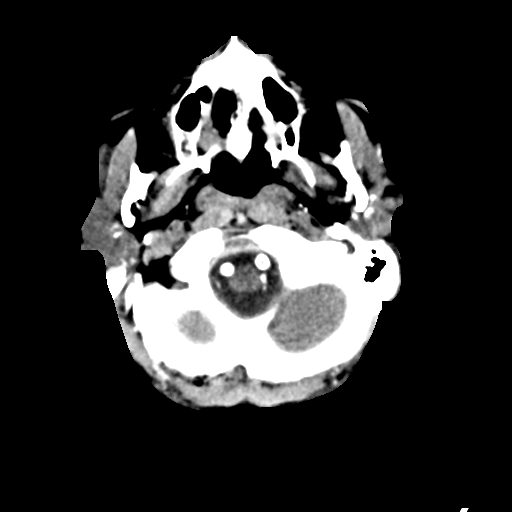
[im 4/31  bone]
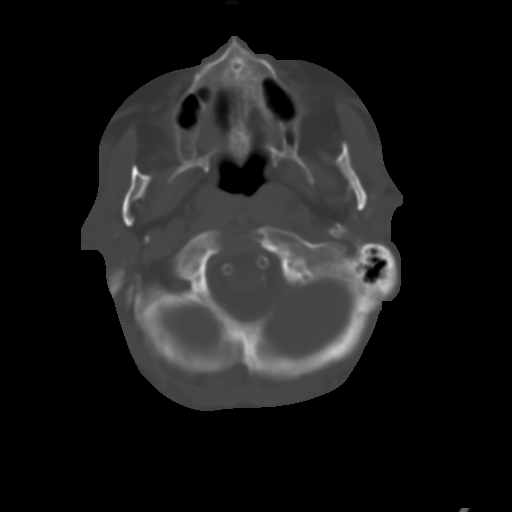
[im 8/31  brain]
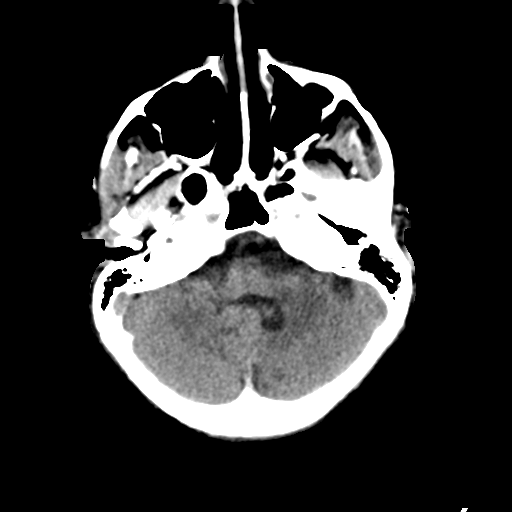
[im 12/31  brain]
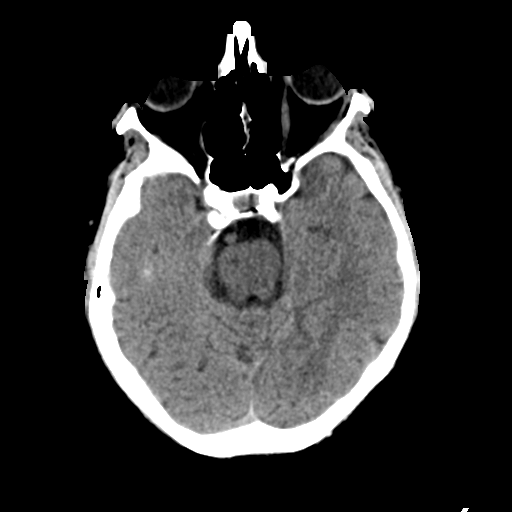
[im 16/31  brain]
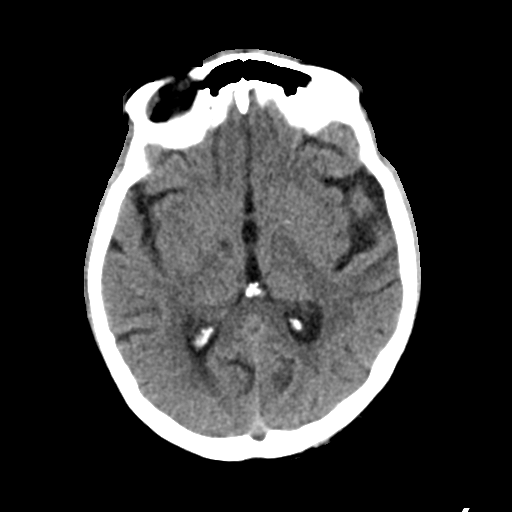
[im 19/31  brain]
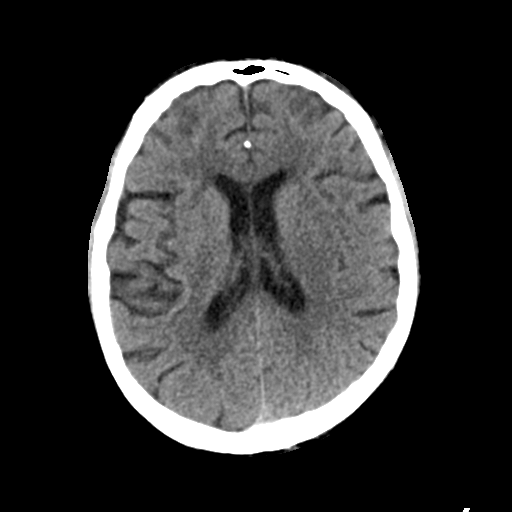
[im 19/31  bone]
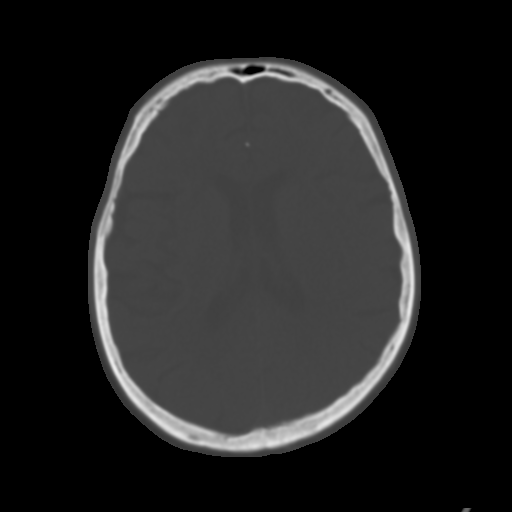
[im 23/31  brain]
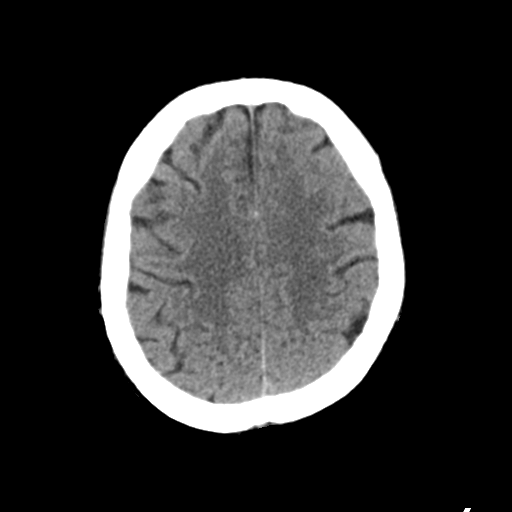
[im 27/31  brain]
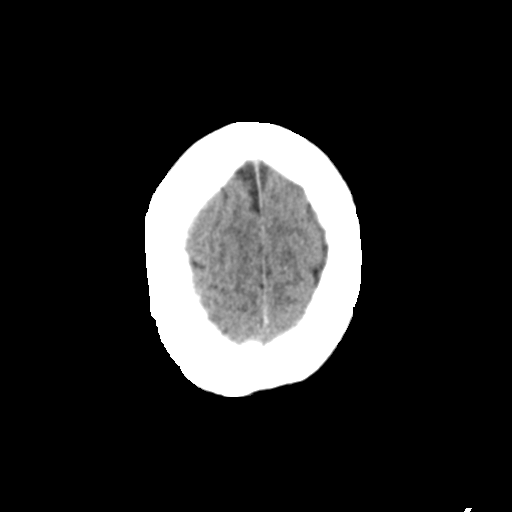

[Series 6: head bone · axial · 0.41mm/px · z∈[-138,-108]mm · 3 of 77 slices shown]
[im 8/77  bone]
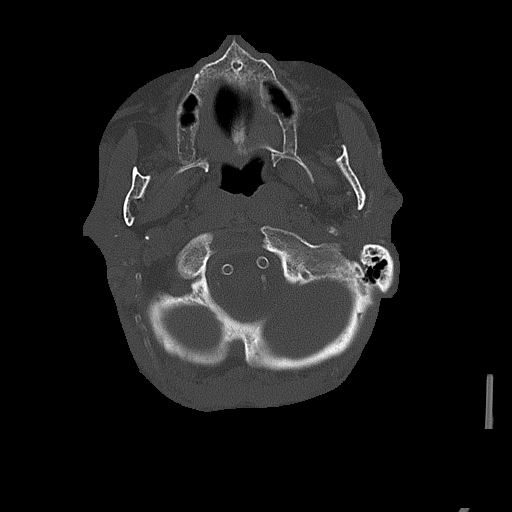
[im 16/77  bone]
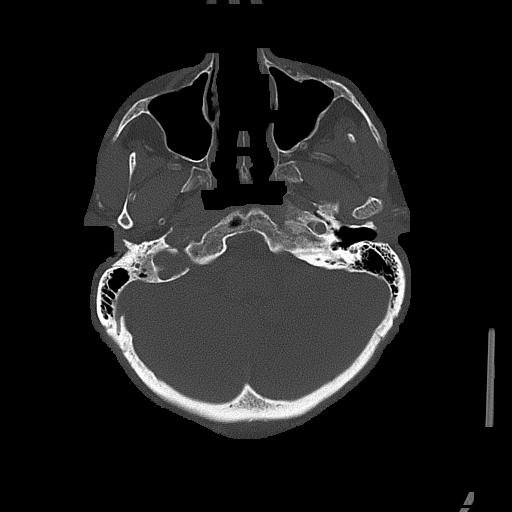
[im 23/77  bone]
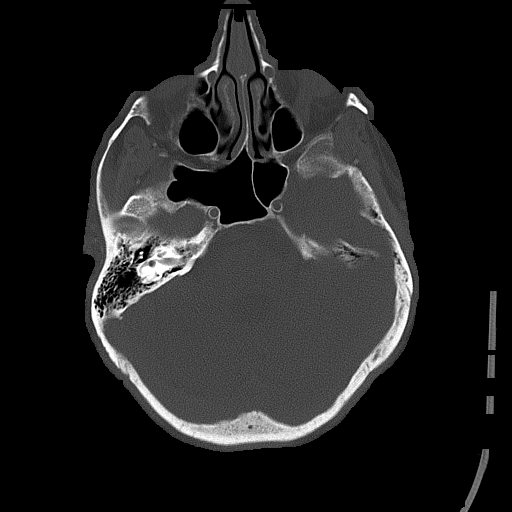

[Series 7: head without cor · coronal · non-contrast · 0.38mm/px · 3 of 64 slices shown]
[im 24/64  brain]
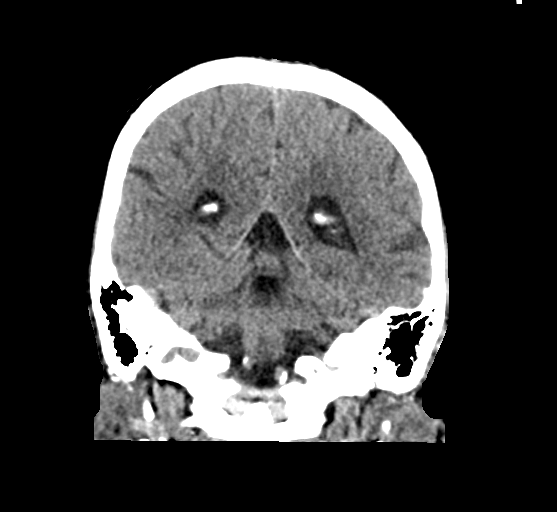
[im 29/64  brain]
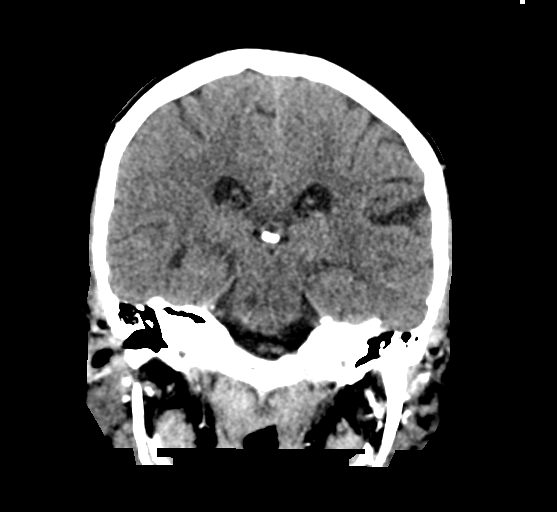
[im 35/64  brain]
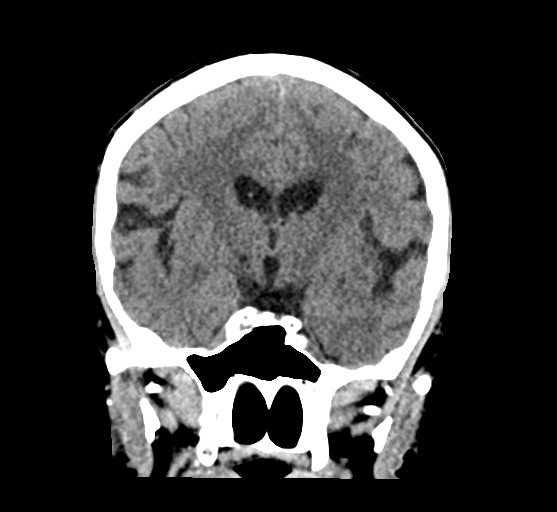

[Series 8: head without sag · sagittal · non-contrast · 0.36mm/px · 3 of 59 slices shown]
[im 20/59  brain]
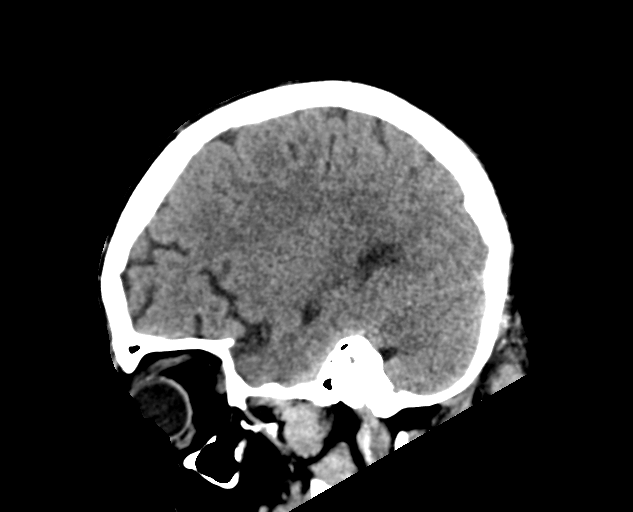
[im 30/59  brain]
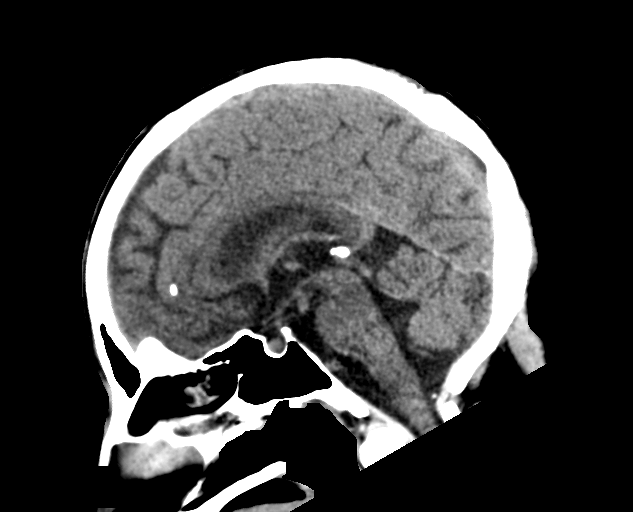
[im 39/59  brain]
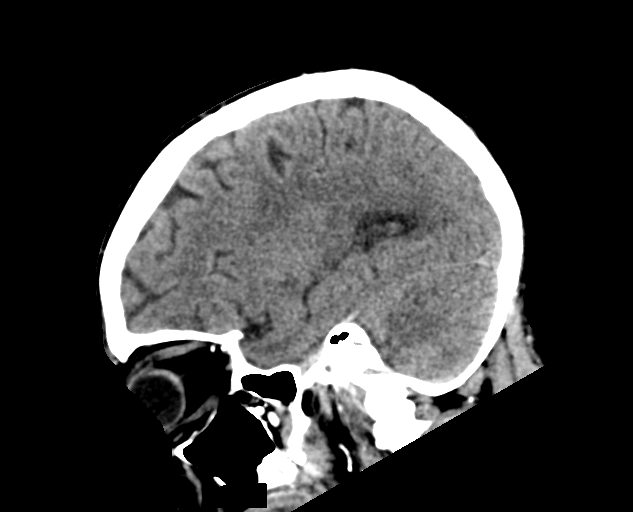

[16 of 47 positions shown; findings below may reference images not displayed]

FINDINGS: Brain: There is mild cerebral atrophy and small vessel disease and
chronic infarct in the lower medial left cerebellar hemisphere,
chronic right thalamic and right pontine lacunar infarcts.

The ventricles are normal in size and position. There is no new
asymmetry concerning for an acute cortical based infarct,
hemorrhage, mass or midline shift. The basal cisterns are clear.

Vascular: There is heavy calcification in the siphons and distal
vertebral arteries but no hyperdense central vessels are visible.

Skull: No fracture or focal bone lesion. There are vascular
calcifications in the scalp.

Sinuses/Orbits: No acute findings. There is proptosis and mild
proliferation of the intraorbital fat. Sinus and mastoid aeration
are preserved.

Other: None.
IMPRESSION: Chronic changes. No acute intracranial CT abnormality or interval
changes.

## 2022-01-07 MED ORDER — ACETAMINOPHEN 650 MG RE SUPP: 650.0000 mg | Freq: Four times a day (QID) | RECTAL | Status: DC | PRN

## 2022-01-07 MED ORDER — ONDANSETRON HCL 4 MG/2ML IJ SOLN
4.0000 mg | Freq: Four times a day (QID) | INTRAMUSCULAR | Status: DC | PRN
  Administered 2022-01-07: 4 mg via INTRAVENOUS
  Filled 2022-01-07: qty 2

## 2022-01-07 MED ORDER — LABETALOL HCL 5 MG/ML IV SOLN
20.0000 mg | INTRAVENOUS | Status: DC | PRN
  Administered 2022-01-07 – 2022-01-09 (×7): 20 mg via INTRAVENOUS
  Filled 2022-01-07 (×7): qty 4

## 2022-01-07 MED ORDER — CLONIDINE HCL 0.1 MG/24HR TD PTWK
0.1000 mg | MEDICATED_PATCH | TRANSDERMAL | Status: DC
  Administered 2022-01-08: 0.1 mg via TRANSDERMAL
  Filled 2022-01-07: qty 1

## 2022-01-07 MED ORDER — LABETALOL HCL 5 MG/ML IV SOLN
10.0000 mg | Freq: Once | INTRAVENOUS | Status: AC
  Administered 2022-01-07: 10 mg via INTRAVENOUS
  Filled 2022-01-07: qty 4

## 2022-01-07 MED ORDER — CYCLOSPORINE MODIFIED (NEORAL) 100 MG PO CAPS
200.0000 mg | ORAL_CAPSULE | Freq: Two times a day (BID) | ORAL | Status: DC
  Filled 2022-01-07 (×2): qty 2

## 2022-01-07 MED ORDER — MYCOPHENOLATE SODIUM 180 MG PO TBEC
540.0000 mg | DELAYED_RELEASE_TABLET | Freq: Two times a day (BID) | ORAL | Status: DC
  Administered 2022-01-07 – 2022-01-10 (×5): 540 mg via ORAL
  Filled 2022-01-07 (×8): qty 3

## 2022-01-07 MED ORDER — AMLODIPINE BESYLATE 10 MG PO TABS
10.0000 mg | ORAL_TABLET | Freq: Every day | ORAL | Status: DC
  Administered 2022-01-08: 10 mg via ORAL
  Filled 2022-01-07: qty 1

## 2022-01-07 MED ORDER — FERRIC CITRATE 1 GM 210 MG(FE) PO TABS
630.0000 mg | ORAL_TABLET | Freq: Three times a day (TID) | ORAL | Status: DC
  Administered 2022-01-07 – 2022-01-10 (×6): 630 mg via ORAL
  Filled 2022-01-07 (×13): qty 3

## 2022-01-07 MED ORDER — LOSARTAN POTASSIUM 50 MG PO TABS
100.0000 mg | ORAL_TABLET | Freq: Every day | ORAL | Status: DC
  Administered 2022-01-08 – 2022-01-10 (×2): 100 mg via ORAL
  Filled 2022-01-07 (×5): qty 2

## 2022-01-07 MED ORDER — ACETAMINOPHEN 325 MG PO TABS: 650.0000 mg | ORAL_TABLET | Freq: Four times a day (QID) | ORAL | Status: DC | PRN

## 2022-01-07 MED ORDER — CALCIUM ACETATE (PHOS BINDER) 667 MG PO CAPS
2001.0000 mg | ORAL_CAPSULE | Freq: Three times a day (TID) | ORAL | Status: DC
  Administered 2022-01-07 – 2022-01-10 (×5): 2001 mg via ORAL
  Filled 2022-01-07 (×6): qty 3

## 2022-01-07 MED ORDER — LABETALOL HCL 5 MG/ML IV SOLN
20.0000 mg | Freq: Once | INTRAVENOUS | Status: AC
  Administered 2022-01-07: 20 mg via INTRAVENOUS
  Filled 2022-01-07: qty 4

## 2022-01-07 MED ORDER — HEPARIN SODIUM (PORCINE) 5000 UNIT/ML IJ SOLN
5000.0000 [IU] | Freq: Three times a day (TID) | INTRAMUSCULAR | Status: DC
  Filled 2022-01-07 (×2): qty 1

## 2022-01-07 MED ORDER — SODIUM CHLORIDE 0.9% FLUSH
3.0000 mL | Freq: Two times a day (BID) | INTRAVENOUS | Status: DC
  Administered 2022-01-07 – 2022-01-10 (×5): 3 mL via INTRAVENOUS

## 2022-01-07 MED ORDER — ONDANSETRON HCL 4 MG PO TABS: 4.0000 mg | ORAL_TABLET | Freq: Four times a day (QID) | ORAL | Status: DC | PRN

## 2022-01-07 MED ORDER — PREDNISONE 10 MG PO TABS
15.0000 mg | ORAL_TABLET | Freq: Every day | ORAL | Status: DC
Start: 2022-01-07 — End: 2022-01-11
  Administered 2022-01-08 – 2022-01-10 (×2): 15 mg via ORAL
  Filled 2022-01-07: qty 1.5
  Filled 2022-01-07 (×2): qty 2
  Filled 2022-01-07: qty 1.5

## 2022-01-07 MED ORDER — CALCIUM ACETATE (PHOS BINDER) 667 MG PO CAPS
667.0000 mg | ORAL_CAPSULE | ORAL | Status: DC | PRN
  Administered 2022-01-07: 667 mg via ORAL

## 2022-01-07 MED ORDER — ASPIRIN 81 MG PO CHEW
81.0000 mg | CHEWABLE_TABLET | Freq: Every day | ORAL | Status: DC
  Administered 2022-01-08 – 2022-01-10 (×2): 81 mg via ORAL
  Filled 2022-01-07 (×2): qty 1

## 2022-01-07 NOTE — ED Notes (Signed)
Patient to bed 4. Placed on bedside monitor. Patient able to mumble her name. Unable to states where she is or give any other information. ?

## 2022-01-07 NOTE — ED Notes (Signed)
Dialysis has called about this pt  dialysis permit has not been signed pt is altered ?

## 2022-01-07 NOTE — Procedures (Signed)
? ?  I was present at this dialysis session, have reviewed the session itself and made  appropriate changes ?Kelly Splinter MD ?[employer] ?pager [phone]   ?01/07/2022, 10:12 AM ? ? ?

## 2022-01-07 NOTE — ED Notes (Signed)
Patient has returned from dialysis.

## 2022-01-07 NOTE — H&P (Signed)
?History and Physical  ? ? ?[name] XTG:[id number] DOB: 1981/07/06 DOA: 01/06/2022 ? ?PCP: [facility] [place]  ? ?Patient coming from: Home  ? ?Chief Complaint: Missed dialysis  ? ?HPI: [name] [name] is a pleasant 41 y.o. female with medical history significant for diabetes mellitus, narcotic dependence, ESRD on hemodialysis, history of renal and pancreatic transplantation, hypertension, and poor adherence with medical treatment, now presenting to the emergency department with confusion and somnolence, reporting that she missed dialysis.  Patient is alone in the emergency department, reported that she missed dialysis but was unable to say when she was last dialyzed.  She seems to be answering all questions with "yes." ? ?ED Course: Upon arrival to the ED, patient is found to be afebrile, saturating well on room air, slightly tachycardic, and hypertensive with systolic 270.  EKG features sinus tachycardia with nonspecific ST and T abnormality.  Head CT negative for acute findings.  Chemistry panel notable for potassium 6.0 and BUN 47.  CBC was stable anemia and slight thrombocytopenia.  Ammonia is normal and acetaminophen and ethanol levels are undetectable.  Nephrology was consulted by the ED PA and the patient was given IV labetalol, Lokelma, IV calcium, and insulin and dextrose in the ED. ? ?Review of Systems:  ?ROS is limited by the patient's clinical condition. ? ?Past Medical History:  ?Diagnosis Date  ? Anemia   ? Anxiety   ? ESRD (end stage renal disease) ([place])   ? Henmodialysis  ? Gastroparesis   ? Hypertension   ? Narcotic abuse ([place])   ? Non compliance with medical treatment   ? Renal disorder 2015  ? transplant  ? Vision loss   ? ? ?Past Surgical History:  ?Procedure Laterality Date  ? A/V FISTULAGRAM Right 02/17/2021  ? Procedure: A/V FISTULAGRAM;  Surgeon: [name], MD;  Location: [place] [facility];  Service: Cardiovascular;  Laterality: Right;  ? AV FISTULA PLACEMENT   11/17/2011  ? Procedure: ARTERIOVENOUS (AV) FISTULA CREATION;  Surgeon: Rosetta Posner, MD;  Location: [place];  Service: Vascular;  Laterality: Right;  ? BIOPSY  12/03/2021  ? Procedure: BIOPSY;  Surgeon: Doran Stabler, MD;  Location: [place];  Service: Gastroenterology;;  ? ESOPHAGOGASTRODUODENOSCOPY (EGD) WITH PROPOFOL N/A 12/03/2021  ? Procedure: ESOPHAGOGASTRODUODENOSCOPY (EGD) WITH PROPOFOL;  Surgeon: Doran Stabler, MD;  Location: [place];  Service: Gastroenterology;  Laterality: N/A;  ? EYE SURGERY    ? HEMATOMA EVACUATION Right 02/25/2021  ? Procedure: EVACUATION HEMATOMA RIGHT CHEST;  Surgeon: Waynetta Sandy, MD;  Location: [place];  Service: Vascular;  Laterality: Right;  ? INSERTION OF DIALYSIS CATHETER Right 12/08/2020  ? Procedure: INSERTION OF TUNNELED DIALYSIS CATHETER;  Surgeon: [name], MD;  Location: [facility];  Service: Vascular;  Laterality: Right;  ? IR REMOVAL TUN CV CATH W/O FL  05/03/2021  ? KIDNEY TRANSPLANT    ? NEPHRECTOMY TRANSPLANTED ORGAN    ? pancrease transplant    ? PERIPHERAL VASCULAR BALLOON ANGIOPLASTY Right 02/17/2021  ? Procedure: PERIPHERAL VASCULAR BALLOON ANGIOPLASTY;  Surgeon: [name], MD;  Location: [place] [facility];  Service: Cardiovascular;  Laterality: Right;  ? REFRACTIVE SURGERY    ? REVISON OF ARTERIOVENOUS FISTULA Right 12/08/2020  ? Procedure: RIGHT ARM ARTERIOVENOUS FISTULA REVISION AND RESECTION;  Surgeon: [name], MD;  Location: [place];  Service: Vascular;  Laterality: Right;  ? REVISON OF ARTERIOVENOUS FISTULA Right 02/25/2021  ? Procedure: RIGHT ARM ARTERIOVENOUS FISTULA REVISION  WITH TRANSPOSITION OF CEPHALIC VEIN ON AXILLARY VEIN;  Surgeon: [name], MD;  Location: [place];  Service: Vascular;  Laterality: Right;  ? ? ?Social History:  ? reports that she has quit smoking. Her smoking use included pipe. She has never used smokeless tobacco. She reports that she does not drink alcohol and does not use  drugs. ? ?Allergies  ?Allergen Reactions  ? Iodinated Contrast Media   ?  Other reaction(s): Unknown  ? ? ?Family History  ?Problem Relation Age of Onset  ? Hypertension Father   ? ? ? ?Prior to Admission medications   ?Medication Sig Start Date End Date Taking? Authorizing Provider  ?amLODipine (NORVASC) 10 MG tablet Take 1 tablet (10 mg total) by mouth daily. 12/05/21 03/05/22  [place] Eric J, DO  ?aspirin 81 MG chewable tablet Chew 81 mg by mouth daily.    [provider]  ?AURYXIA 1 GM 210 MG(Fe) tablet Take 630 mg by mouth 3 (three) times daily. 12/09/21   [provider]  ?calcium acetate, Phos Binder, (PHOSLYRA) 667 MG/5ML SOLN Take 2,668 mg by mouth 3 (three) times daily with meals. Take 3 tablets with meals and 1 tablet with snacks    [provider]  ?CATAPRES-TTS-1 0.1 MG/24HR patch Place 1 patch (0.1 mg total) onto the skin once a week. 12/04/21 03/04/22  [place] Eric J, DO  ?colchicine 0.6 MG tablet Take 0.6 mg by mouth as needed.    [provider]  ?cycloSPORINE modified (NEORAL) 100 MG capsule Take 2 capsules (200 mg total) by mouth 2 (two) times daily. 12/25/21   Pokhrel, Corrie Mckusick, MD  ?Darbepoetin Alfa (ARANESP) 60 MCG/0.3ML SOSY injection Inject 0.3 mLs (60 mcg total) into the vein every Friday with hemodialysis. 11/18/21   Cherene Altes, MD  ?diphenhydrAMINE (BENADRYL) 25 MG tablet Take 25 mg by mouth every 6 (six) hours as needed for itching.    [provider]  ?doxercalciferol (HECTOROL) 4 MCG/2ML injection Inject 3 mLs (6 mcg total) into the vein every Monday, Wednesday, and Friday with hemodialysis. 11/17/21   Cherene Altes, MD  ?ergocalciferol (VITAMIN D2) 1.25 MG (50000 UT) capsule Take 50,000 Units by mouth once a week.    [provider]  ?fluticasone (FLONASE) 50 MCG/ACT nasal spray Place 1 spray into both nostrils daily as needed for allergies. 11/24/21   [provider]  ?furosemide (LASIX) 20 MG tablet Take 20 mg by mouth 4  (four) times a week. Take on non-HD days    [provider]  ?lidocaine-prilocaine (EMLA) cream Apply 1 application topically daily as needed (port access). 07/09/20   [provider]  ?losartan (COZAAR) 100 MG tablet Take 1 tablet (100 mg total) by mouth daily. 12/04/21 03/04/22  [place] Eric J, DO  ?mycophenolate (MYFORTIC) 180 MG EC tablet Take 540 mg by mouth 2 (two) times daily.    [provider]  ?naloxone (NARCAN) nasal spray 4 mg/0.1 mL Place 1 spray into the nose as needed (opioid overdose).    [provider]  ?ondansetron (ZOFRAN) 4 MG tablet Take 4 mg by mouth daily as needed for nausea. 12/07/21   [provider]  ?oxyCODONE-acetaminophen (PERCOCET/ROXICET) 5-325 MG tablet Take 1-2 tablets by mouth every 6 (six) hours as needed for severe pain. ?Patient not taking: Reported on 12/25/2021 03/22/21   [name], MD  ?predniSONE (DELTASONE) 5 MG tablet Take 15 mg by mouth daily. 06/24/15   [provider]  ?sodium zirconium cyclosilicate (LOKELMA)  10 g PACK packet Take 10 g by mouth as directed. Take on Non-Dialysis (Sun,Tues, Thurs, Sat)    [provider]  ? ? ?Physical Exam: ?Vitals:  ? 01/07/22 0145 01/07/22 0230 01/07/22 0245 01/07/22 0300  ?BP: (!) 230/102 (!) 231/109 (!) 212/100 (!) 215/101  ?Pulse: (!) 109 (!) 111 97 96  ?Resp: 18 (!) '23 13 18  '$ ?Temp:      ?TempSrc:      ?SpO2: 93% 96% 94% 93%  ?Weight:      ?Height:      ? ? ?Constitutional: No diaphoresis, sleeping   ?Eyes: PERTLA, lids and conjunctivae normal ?ENMT: Mucous membranes are moist. Posterior pharynx clear of any exudate or lesions.   ?Neck: supple, no masses  ?Respiratory: no wheezing, no crackles. No accessory muscle use.  ?Cardiovascular: S1 & S2 heard, regular rate and rhythm. Trace lower extremity edema.   ?Abdomen: No distension, no tenderness, soft. Bowel sounds active.  ?Musculoskeletal: no clubbing / cyanosis. No joint deformity upper and lower extremities.    ?Skin: no significant rashes, lesions, ulcers. Warm, dry, well-perfused. ?Neurologic: CN 2-12 grossly intact. Moving all extremities. Sleeping, wakes to voice. Answers all questions with "yes."   ? ? ?Labs and

## 2022-01-07 NOTE — ED Notes (Signed)
The pt is awake finally.  She is confused and disoriented.  Dialysis called approx [phone]  they were notified that the pt was unable to sign consent for dialysis due to her being altered.  She just looks at me when I ask  what day is this  is family with her  then she just closes her eyes no answer to any questions ?

## 2022-01-07 NOTE — Consult Note (Addendum)
Renal Service ?Consult Note ?[facility] ? ?[name] [name] ?01/07/2022 ?[name] Blazing, MD ?Requesting Physician: Dr. Posey Pronto ? ?Reason for Consult: ESRD pt w/ AMS ?HPI: The patient is a 41 y.o. year-old w/ hx of anemia, anxiety, ESRD on HD, HTN, gastroparesis, sp KP renal transplant (kidney failed, pancreas still working) who presented w/ AMS to ED and states she missed HD. In ED pt was afeb, SpO2 okay and SBP 210. Head CT neg. K 6.0, BUN 47, NH3 wnl. She rec'd IV labetalol, po lokelma, IV Ca, ins/ dext in ED. We are asked to see for ESRD.   ? ?Pt seen in HD unit. Pt is awake but not responding appropriately. She is slow to respond and will not respond to all questions.  ? ?ROS - denies CP, no joint pain, no HA, no blurry vision, no rash, no diarrhea, no nausea/ vomiting, no dysuria, no difficulty voiding ? ? ?Past Medical History  ?Past Medical History:  ?Diagnosis Date  ? Anemia   ? Anxiety   ? ESRD (end stage renal disease) ([place])   ? Henmodialysis  ? Gastroparesis   ? Hypertension   ? Narcotic abuse ([place])   ? Non compliance with medical treatment   ? Renal disorder 2015  ? transplant  ? Vision loss   ? ?Past Surgical History  ?Past Surgical History:  ?Procedure Laterality Date  ? A/V FISTULAGRAM Right 02/17/2021  ? Procedure: A/V FISTULAGRAM;  Surgeon: [name], MD;  Location: [place] [facility];  Service: Cardiovascular;  Laterality: Right;  ? AV FISTULA PLACEMENT  11/17/2011  ? Procedure: ARTERIOVENOUS (AV) FISTULA CREATION;  Surgeon: Rosetta Posner, MD;  Location: [place];  Service: Vascular;  Laterality: Right;  ? BIOPSY  12/03/2021  ? Procedure: BIOPSY;  Surgeon: Doran Stabler, MD;  Location: [place];  Service: Gastroenterology;;  ? ESOPHAGOGASTRODUODENOSCOPY (EGD) WITH PROPOFOL N/A 12/03/2021  ? Procedure: ESOPHAGOGASTRODUODENOSCOPY (EGD) WITH PROPOFOL;  Surgeon: Doran Stabler, MD;  Location: [place];  Service: Gastroenterology;  Laterality: N/A;  ? EYE SURGERY    ?  HEMATOMA EVACUATION Right 02/25/2021  ? Procedure: EVACUATION HEMATOMA RIGHT CHEST;  Surgeon: Waynetta Sandy, MD;  Location: [place];  Service: Vascular;  Laterality: Right;  ? INSERTION OF DIALYSIS CATHETER Right 12/08/2020  ? Procedure: INSERTION OF TUNNELED DIALYSIS CATHETER;  Surgeon: [name], MD;  Location: [facility];  Service: Vascular;  Laterality: Right;  ? IR REMOVAL TUN CV CATH W/O FL  05/03/2021  ? KIDNEY TRANSPLANT    ? NEPHRECTOMY TRANSPLANTED ORGAN    ? pancrease transplant    ? PERIPHERAL VASCULAR BALLOON ANGIOPLASTY Right 02/17/2021  ? Procedure: PERIPHERAL VASCULAR BALLOON ANGIOPLASTY;  Surgeon: [name], MD;  Location: [place] [facility];  Service: Cardiovascular;  Laterality: Right;  ? REFRACTIVE SURGERY    ? REVISON OF ARTERIOVENOUS FISTULA Right 12/08/2020  ? Procedure: RIGHT ARM ARTERIOVENOUS FISTULA REVISION AND RESECTION;  Surgeon: [name], MD;  Location: [place];  Service: Vascular;  Laterality: Right;  ? REVISON OF ARTERIOVENOUS FISTULA Right 02/25/2021  ? Procedure: RIGHT ARM ARTERIOVENOUS FISTULA REVISION WITH TRANSPOSITION OF CEPHALIC VEIN ON AXILLARY VEIN;  Surgeon: [name], MD;  Location: [place];  Service: Vascular;  Laterality: Right;  ? ?Family History  ?Family History  ?Problem Relation Age of Onset  ? Hypertension Father   ? ?Social History  reports that she has quit smoking. Her smoking use included pipe. She has never used  smokeless tobacco. She reports that she does not drink alcohol and does not use drugs. ?Allergies  ?Allergies  ?Allergen Reactions  ? Iodinated Contrast Media   ?  Other reaction(s): Unknown  ? ?Home medications ?Prior to Admission medications   ?Medication Sig Start Date End Date Taking? Authorizing Provider  ?amLODipine (NORVASC) 10 MG tablet Take 1 tablet (10 mg total) by mouth daily. 12/05/21 03/05/22  [place] Eric J, DO  ?aspirin 81 MG chewable tablet Chew 81 mg by mouth daily.    [provider]  ?AURYXIA  1 GM 210 MG(Fe) tablet Take 630 mg by mouth 3 (three) times daily. 12/09/21   [provider]  ?calcium acetate, Phos Binder, (PHOSLYRA) 667 MG/5ML SOLN Take 2,668 mg by mouth 3 (three) times daily with meals. Take 3 tablets with meals and 1 tablet with snacks    [provider]  ?CATAPRES-TTS-1 0.1 MG/24HR patch Place 1 patch (0.1 mg total) onto the skin once a week. 12/04/21 03/04/22  [place] Eric J, DO  ?colchicine 0.6 MG tablet Take 0.6 mg by mouth as needed.    [provider]  ?cycloSPORINE modified (NEORAL) 100 MG capsule Take 2 capsules (200 mg total) by mouth 2 (two) times daily. 12/25/21   Pokhrel, Corrie Mckusick, MD  ?Darbepoetin Alfa (ARANESP) 60 MCG/0.3ML SOSY injection Inject 0.3 mLs (60 mcg total) into the vein every Friday with hemodialysis. 11/18/21   Cherene Altes, MD  ?diphenhydrAMINE (BENADRYL) 25 MG tablet Take 25 mg by mouth every 6 (six) hours as needed for itching.    [provider]  ?doxercalciferol (HECTOROL) 4 MCG/2ML injection Inject 3 mLs (6 mcg total) into the vein every Monday, Wednesday, and Friday with hemodialysis. 11/17/21   Cherene Altes, MD  ?ergocalciferol (VITAMIN D2) 1.25 MG (50000 UT) capsule Take 50,000 Units by mouth once a week.    [provider]  ?fluticasone (FLONASE) 50 MCG/ACT nasal spray Place 1 spray into both nostrils daily as needed for allergies. 11/24/21   [provider]  ?furosemide (LASIX) 20 MG tablet Take 20 mg by mouth 4 (four) times a week. Take on non-HD days    [provider]  ?lidocaine-prilocaine (EMLA) cream Apply 1 application topically daily as needed (port access). 07/09/20   [provider]  ?losartan (COZAAR) 100 MG tablet Take 1 tablet (100 mg total) by mouth daily. 12/04/21 03/04/22  [place] Eric J, DO  ?mycophenolate (MYFORTIC) 180 MG EC tablet Take 540 mg by mouth 2 (two) times daily.    [provider]  ?naloxone (NARCAN) nasal spray 4 mg/0.1 mL Place 1 spray into the  nose as needed (opioid overdose).    [provider]  ?ondansetron (ZOFRAN) 4 MG tablet Take 4 mg by mouth daily as needed for nausea. 12/07/21   [provider]  ?oxyCODONE-acetaminophen (PERCOCET/ROXICET) 5-325 MG tablet Take 1-2 tablets by mouth every 6 (six) hours as needed for severe pain. ?Patient not taking: Reported on 12/25/2021 03/22/21   [name], MD  ?predniSONE (DELTASONE) 5 MG tablet Take 15 mg by mouth daily. 06/24/15   [provider]  ?sodium zirconium cyclosilicate (LOKELMA) 10 g PACK packet Take 10 g by mouth as directed. Take on Non-Dialysis (Sun,Tues, Thurs, Sat)    [provider]  ? ? ? ?Vitals:  ? 01/07/22 1100 01/07/22 1130 01/07/22 1200 01/07/22 1220  ?BP: 110/75 129/79 (!) 145/86 (!) 160/89  ?Pulse: 99 97 100 100  ?Resp:      ?Temp:  97.8 ?F (36.6 ?C)  ?TempSrc:    Temporal  ?SpO2:    96%  ?Weight:      ?Height:      ? ?Exam ?Gen no distress, lethargic, only answers the simplest questions ?No rash, cyanosis or gangrene ?Sclera anicteric, throat clear  ?No jvd or bruits ?Chest clear bilat to bases, no rales/ wheezing ?RRR no RG ?Abd soft ntnd no mass or ascites +bs ?GU defer ?MS no joint effusions or deformity ?Ext no LE or UE edema, no wounds or ulcers ?Neuro is as above ?    ? ? ? ? ? Home meds include - norvasc 10, asa, auryxia 3 ac, phoslyra 3 ac, catapres 0.1 patch weekly, cyclosporine 200 bid, losartan, myfortic 540 bid, prednisone 15 qd, prns/ vits/ supps  ? ?   Renal transplant CyA goal level > 75- 100 ?   Pancreas transplant CyA goal level 100- 150 ? ? OP HD: AF MWF  3h 78mn  400/500  53.5kg  2/2 bath  RUA AVF Hep none ? - last HD 5/03, 56.2 > 54.3kg ? - last Hb 8.4 on 5/03 ? - mircera 225 q2, last 5/03, due 5/17 ? - hectorol 8 ug tiw IV ? - sensipar '90mg'$  tiw po ? ? ?Assessment/ Plan: ?Acute encephalopathy - w/u in progress. This has been a recurrent issue. NH3 is normal here and pt's HD compliance is good so not uremic. Doesn't appear  septic. Cyclosporine neurotoxicity should be considered. Suggest neuro consult or consider transfer to a tertiary care center, will d/w pmd. Will ask pharm to get trough level.   ?ESRD - gets HD on MW

## 2022-01-07 NOTE — ED Notes (Signed)
Able to encourage patient to take her morning medications. Hemodialysis is on the way to get patient.  ?

## 2022-01-07 NOTE — ED Notes (Signed)
Patient started vomiting. Medicated as ordered. Patient cleaned up and then transport was here to take her to dialysis. ?

## 2022-01-07 NOTE — Progress Notes (Signed)
[name] ?PLAN OF CARE NOTE ?Patient: [name] [name] UYW:[id number]   ?[facility]P: [facility] DOB: Apr 21, 1981   ?DOA: 01/06/2022   DOS: 01/07/2022   ? ?Patient was admitted by my colleague earlier on 01/07/2022. I have reviewed the H&P as well as assessment and plan and agree with the same. ?Important changes in the plan are listed below. ? ?Plan of care: ?Principal Problem: ?  Acute encephalopathy ?Active Problems: ?  ESRD on dialysis [facility]) ?  Type 2 diabetes mellitus with other diabetic kidney complication (HCC) ?  Immunosuppression ([place]) ?  Hyperkalemia ?  Narcotic dependence ([place]) ?  HTN (hypertension), malignant ?HD ordered by Nephrology ?Will monitor for improvement in mentation after HD ? ?Author: ?Berle Mull, MD ?[name] ?01/07/2022 8:16 AM   ?If 7PM-7AM, please contact night-coverage at [email or web address] ?   ?

## 2022-01-08 DIAGNOSIS — G9340 Encephalopathy, unspecified: Secondary | ICD-10-CM | POA: Diagnosis not present

## 2022-01-08 LAB — CBC
HCT: 32.3 % — ABNORMAL LOW (ref 36.0–46.0)
Hemoglobin: 9.8 g/dL — ABNORMAL LOW (ref 12.0–15.0)
MCH: 29.8 pg (ref 26.0–34.0)
MCHC: 30.3 g/dL (ref 30.0–36.0)
MCV: 98.2 fL (ref 80.0–100.0)
Platelets: 148 10*3/uL — ABNORMAL LOW (ref 150–400)
RBC: 3.29 MIL/uL — ABNORMAL LOW (ref 3.87–5.11)
RDW: 15.3 % (ref 11.5–15.5)
WBC: 4 10*3/uL (ref 4.0–10.5)
nRBC: 0 % (ref 0.0–0.2)

## 2022-01-08 LAB — RENAL FUNCTION PANEL
Albumin: 3.2 g/dL — ABNORMAL LOW (ref 3.5–5.0)
Anion gap: 16 — ABNORMAL HIGH (ref 5–15)
BUN: 22 mg/dL — ABNORMAL HIGH (ref 6–20)
CO2: 26 mmol/L (ref 22–32)
Calcium: 9.6 mg/dL (ref 8.9–10.3)
Chloride: 98 mmol/L (ref 98–111)
Creatinine, Ser: 5.51 mg/dL — ABNORMAL HIGH (ref 0.44–1.00)
GFR, Estimated: 9 mL/min — ABNORMAL LOW (ref >60)
Glucose, Bld: 107 mg/dL — ABNORMAL HIGH (ref 70–99)
Phosphorus: 5.5 mg/dL — ABNORMAL HIGH (ref 2.5–4.6)
Potassium: 5.1 mmol/L (ref 3.5–5.1)
Sodium: 140 mmol/L (ref 135–145)

## 2022-01-08 LAB — HEPATITIS B SURFACE ANTIBODY, QUANTITATIVE: Hep B S AB Quant (Post): 413.7 m[IU]/mL (ref >9.9)

## 2022-01-08 MED ORDER — LABETALOL HCL 200 MG PO TABS
200.0000 mg | ORAL_TABLET | Freq: Three times a day (TID) | ORAL | Status: DC
  Administered 2022-01-08: 200 mg via ORAL
  Filled 2022-01-08 (×3): qty 1

## 2022-01-08 MED ORDER — SODIUM CHLORIDE 0.9 % IV SOLN: 100.0000 mL | INTRAVENOUS | Status: DC | PRN

## 2022-01-08 MED ORDER — HYDRALAZINE HCL 50 MG PO TABS
50.0000 mg | ORAL_TABLET | Freq: Three times a day (TID) | ORAL | Status: DC
Start: 2022-01-08 — End: 2022-01-09
  Administered 2022-01-08 (×2): 50 mg via ORAL
  Filled 2022-01-08 (×2): qty 1

## 2022-01-08 NOTE — Assessment & Plan Note (Signed)
Hemoglobin A1c 4.1. ?Currently diet controlled  ?monitor. ?

## 2022-01-08 NOTE — Progress Notes (Signed)
Bp-193/101- asymptomatic. Labetalol 20 mg iv given. continue to monitor. ?

## 2022-01-08 NOTE — Assessment & Plan Note (Addendum)
Combination of hypertensive encephalopathy Acute metabolic encephalopathy in the setting of missing HD. ?Hypertensive emergency ?Possibility of a psychotic event contributed out. ?Possibility of cyclosporine toxicity cannot be ruled out as well. ?Underwent HD. ?Mentation waxing and waning. ?On the morning of 5/8 the patient is not cooperative and will not take any p.o. medication. ?Severely agitated requiring restraints. ?PCCM consulted for further assistance and the patient was transferred to the ICU for aggressive management of her encephalopathy. ?Psychiatry and neurology were also consulted for further assistance. ?

## 2022-01-08 NOTE — Progress Notes (Addendum)
?[facility] ?Progress Note  ? ?Subjective:    ?Seen and examined patient at bedside. Tolerated yesterday's HD with net UF 2L. Appears more awake and mildly forgetful. Denies SOB, CP, and N/V. Plan for HD 01/09/22. ? ?Objective ?Vitals:  ? 01/07/22 2346 01/08/22 0034 01/08/22 0431 01/08/22 0815  ?BP: (!) 175/86 (!) 160/84 (!) 175/82 (!) 190/89  ?Pulse: (!) 101 96 95 100  ?Resp: (!) '22 20 17 19  '$ ?Temp:  98.9 ?F (37.2 ?C) 98.7 ?F (37.1 ?C) 99 ?F (37.2 ?C)  ?TempSrc:  Oral Oral Oral  ?SpO2: 98% 96% 96% 99%  ?Weight:      ?Height:      ? ?Physical Exam ?General: Awake; mildly forgetful; On RA; NAD ?Heart: S1 and S2; No murmurs, gallops, or rubs ?Lungs: Clear throughout ?Abdomen: Soft and non-tender ?Extremities: No edema BLLE ?Dialysis Access: RUE AVF (+) B/T  ? ?Filed Weights  ? 01/06/22 2112  ?Weight: 51 kg  ? ? ?Intake/Output Summary (Last 24 hours) at 01/08/2022 0918 ?Last data filed at 01/07/2022 2345 ?Gross per 24 hour  ?Intake 360 ml  ?Output 2000 ml  ?Net -1640 ml  ? ? ?Additional Objective ?Labs: ?Basic Metabolic Panel: ?Recent Labs  ?Lab 01/07/22 ?0006 01/07/22 ?5643 01/08/22 ?0041  ?NA 138 138 140  ?K 5.4* 4.9 5.1  ?CL 100 97* 98  ?CO2 '23 24 26  '$ ?GLUCOSE 161* 84 107*  ?BUN 48* 50* 22*  ?CREATININE 9.01* 9.57* 5.51*  ?CALCIUM 9.7 9.8 9.6  ?PHOS 5.3*  --  5.5*  ? ?Liver Function Tests: ?Recent Labs  ?Lab 01/06/22 ?2127 01/07/22 ?0006 01/08/22 ?0041  ?AST 11*  --   --   ?ALT 11  --   --   ?ALKPHOS 68  --   --   ?BILITOT 0.8  --   --   ?PROT 7.1  --   --   ?ALBUMIN 3.5 3.2* 3.2*  ? ?No results for input(s): LIPASE, AMYLASE in the last 168 hours. ?CBC: ?Recent Labs  ?Lab 01/06/22 ?2127 01/07/22 ?0005 01/07/22 ?0442 01/08/22 ?0041  ?WBC 5.0 4.1 4.2 4.0  ?HGB 10.0* 9.2* 9.3* 9.8*  ?HCT 31.3* 29.9* 29.0* 32.3*  ?MCV 96.9 100.0 97.0 98.2  ?PLT 153 135* 138* 148*  ? ?Blood Culture ?   ?Component Value Date/Time  ? SDES BLOOD BLOOD LEFT ARM 12/23/2021 0950  ? SPECREQUEST  12/23/2021 0950  ?  BOTTLES DRAWN  AEROBIC AND ANAEROBIC Blood Culture adequate volume  ? CULT  12/23/2021 0950  ?  NO GROWTH 5 DAYS ?Performed at [facility], [place] [address], [place], [place] [address] ?  ? REPTSTATUS 12/28/2021 FINAL 12/23/2021 0950  ? ? ?Cardiac Enzymes: ?No results for input(s): CKTOTAL, CKMB, CKMBINDEX, TROPONINI in the last 168 hours. ?CBG: ?Recent Labs  ?Lab 01/06/22 ?2125 01/07/22 ?2024  ?GLUCAP 78 100*  ? ?Iron Studies: No results for input(s): IRON, TIBC, TRANSFERRIN, FERRITIN in the last 72 hours. ?Lab Results  ?Component Value Date  ? INR 1.3 (H) 12/01/2021  ? INR 1.2 12/20/2018  ? INR 1.25 11/16/2011  ? ?Studies/Results: ?CT Head Wo Contrast ? ?Result Date: 01/07/2022 ?CLINICAL DATA:  Altered mental status.  Missed dialysis appointment. EXAM: CT HEAD WITHOUT CONTRAST TECHNIQUE: Contiguous axial images were obtained from the base of the skull through the vertex without intravenous contrast. RADIATION DOSE REDUCTION: This exam was performed according to the departmental dose-optimization program which includes automated exposure control, adjustment of the mA and/or kV according to patient size and/or use  of iterative reconstruction technique. COMPARISON:  Head CT 12/23/2021, MRI brain 12/24/2021. 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 The ventricles are normal in size and position. There is no new asymmetry concerning for an acute cortical based infarct, hemorrhage, mass or midline shift. The basal cisterns are clear. Vascular: There is heavy calcification in the siphons and distal vertebral arteries but no hyperdense central vessels are visible. Skull: No fracture or focal bone lesion. There are vascular calcifications in the scalp. Sinuses/Orbits: No acute findings. There is proptosis and mild proliferation of the intraorbital fat. Sinus and mastoid aeration  are preserved. Other: None. IMPRESSION: Chronic changes. No acute intracranial CT abnormality or interval changes. Electronically Signed   By: Telford Nab M.D.   On: 01/07/2022 01:09   ? ?Medications: ? sodium chloride    ? sodium chloride    ? ? amLODipine  10 mg Oral Daily  ? aspirin  81 mg Oral Daily  ? calcium acetate  2,001 mg Oral TID WC  ? Chlorhexidine Gluconate Cloth  6 each Topical Q0600  ? cloNIDine  0.1 mg Transdermal Q Sun  ? ferric citrate  630 mg Oral TID WC  ? heparin  5,000 Units Subcutaneous Q8H  ? losartan  100 mg Oral Daily  ? mycophenolate  540 mg Oral BID  ? predniSONE  15 mg Oral Q breakfast  ? sodium chloride flush  3 mL Intravenous Q12H  ? ? ?Dialysis Orders: ?AF MWF  3h 67mn  400/500  53.5kg  2/2 bath  RUA AVF Hep none ? - last HD 5/03, 56.2 > 54.3kg ? - last Hb 8.4 on 5/03 ? - mircera 225 q2, last 5/03, due 5/17 ? - hectorol 8 ug tiw IV ? - sensipar '90mg'$  tiw po ? ?Assessment/Plan: ?Acute encephalopathy - w/u in progress. This has been a recurrent issue. NH3 is normal here and pt's HD compliance is good so not uremic. Doesn't appear septic. Cyclosporine neurotoxicity could be a possibility. Trough CyA level is pending.  ?ESRD - gets HD on MWF. Had HD here 5/6 off schedule. Next HD Monday 01/09/22. ?BP/ vol - no sig vol excess, UF 2-3 L w/ HD as tolerated ?HTN - BP high this AM, recently received her BP meds, continue current regimen for now and follow trends closely.  ?Anemia ckd - Hb 9.3, next esa due on 5/17.  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 Continue binders.  ? ?CTobie Poet NP ?[facility] ?01/08/2022,9:18 AM ? LOS: 1 day  ?  ?Pt seen, examined and agree w A/P as above.  ?RKelly Splinter MD ?01/08/2022, 7:12 PM ? ? ?

## 2022-01-08 NOTE — Assessment & Plan Note (Signed)
Currently holding all psychotropic medications. ?

## 2022-01-08 NOTE — Progress Notes (Signed)
Refused to use SCD at this time. Explained the importance of it,but still insistent not to use it. ?

## 2022-01-08 NOTE — Progress Notes (Signed)
Pt refusing subQ heparin at this time.  Pt educated on the reason for medication, but still refusing.  Pt mobile and moving legs routinely.  Will relay to oncoming day nurse and MD. ?

## 2022-01-08 NOTE — Progress Notes (Signed)
?  Progress Note ?Patient: [name] [name] PZW:[id number] DOB: 1981-01-10 DOA: 01/06/2022  ?DOS: the patient was seen and examined on 01/08/2022 ? ?Brief hospital course: ?[name] [name] is a pleasant 41 y.o. female with medical history significant for diabetes mellitus, narcotic dependence, ESRD on hemodialysis, history of renal and pancreatic transplantation, hypertension, and poor adherence with medical treatment, now presenting to the emergency department with confusion and somnolence, reporting that she missed dialysis. ?Assessment and Plan: ?* Acute encephalopathy ?Combination of hypertensive encephalopathy Acute metabolic encephalopathy in the setting of missing HD. ?Hypertensive emergency ?Underwent HD. ?Mentation improving. ?Continues to have high blood pressure. ?Mentation appears to be close to baseline. ?Asterixis on exam. ?CT head unremarkable. ?Aggressively treating hypertension with Norvasc, clonidine patch, losartan, labetalol and hydralazine. ? ?ESRD on dialysis [facility]) ?Hyperkalemia. ?Nephrology consult appreciated. ?Patient is undergoing HD per nephrology. ?Hyperkalemia, corrected with HD. ? ?Narcotic dependence ([place]) ?Currently holding all psychotropic medications. ? ?Immunosuppression ([place]) ?History of renal and pancreatic transplant in 2014. ?Currently on the list for secondary renal transplant consideration. ?Patient is on cyclosporine, prednisone and CellCept. ?Her presentation can also be secondary to cyclosporine toxicity. ?Patient has not followed up with pancreatic transplant team since 2018. ?Currently continue prednisone and CellCept.  Outpatient follow-up with pancreatic transplant team. ?Cyclosporine currently on hold.  Levels currently pending. ? ?Type 2 diabetes mellitus with other diabetic kidney complication ([place]) ?Hemoglobin A1c 4.1. ?Currently diet controlled  ?monitor. ? ? ?Patient is refusing pharmacological DVT prophylaxis.  We will switch to SCDs. ? ?Subjective: No headache no  dizziness.  No nausea no vomiting.  Denies any other acute complaints. ? ?Physical Exam: ?Vitals:  ? 01/08/22 0431 01/08/22 0815 01/08/22 1103 01/08/22 1600  ?BP: (!) 175/82 (!) 190/89 (!) 193/87 (!) 193/101  ?Pulse: 95 100 (!) 106 (!) 105  ?Resp: '17 19 16 19  '$ ?Temp: 98.7 ?F (37.1 ?C) 99 ?F (37.2 ?C) 99 ?F (37.2 ?C) 98.1 ?F (36.7 ?C)  ?TempSrc: Oral Oral Oral Oral  ?SpO2: 96% 99% 97% 98%  ?Weight:      ?Height:      ? ?General: Appear in mild distress; no visible Abnormal Neck Mass Or lumps, Conjunctiva normal ?Cardiovascular: S1 and S2 Present, no Murmur, ?Respiratory: good respiratory effort, Bilateral Air entry present and CTA, no Crackles, no wheezes ?Abdomen: Bowel Sound present, Non tender  ?Extremities: no Pedal edema ?Neurology: alert and oriented to time, place, and person ?Gait not checked due to patient safety concerns  ? ?Data Reviewed: ?I have Reviewed nursing notes, Vitals, and Lab results since pt's last encounter. Pertinent lab results CBC and CMP ?I have ordered test including CBC and BMP ?I have discussed pt's care plan and test results with nephrology.  ? ?Family Communication: None at bedside ? ?Disposition: ?Status is: Inpatient ?Remains inpatient appropriate because: Uncontrolled blood pressure likely responsible for encephalopathy.  Currently being aggressively treated. ? ?Author: ?Berle Mull, MD ?01/08/2022 4:22 PM ? ?Please look on [email or web address] to find out who is on call. ?

## 2022-01-08 NOTE — Assessment & Plan Note (Addendum)
History of renal and pancreatic transplant in 2014. ?Currently on the list for secondary renal transplant consideration. ?Patient is on cyclosporine, prednisone and CellCept. ?Her presentation can also be secondary to cyclosporine toxicity. ?Patient has not followed up with pancreatic transplant team since 2018. ?Currently continue prednisone and CellCept.  Outpatient follow-up with pancreatic transplant team. ?Cyclosporine currently on hold.  Levels currently pending. ?

## 2022-01-08 NOTE — Assessment & Plan Note (Addendum)
Hyperkalemia. ?Nephrology consult appreciated. ?Refusing HD. ?Hyperkalemia, corrected with HD. ?

## 2022-01-08 NOTE — Progress Notes (Signed)
Blood pressure  202/94, asymptomatic. Am dose of Norvasc, cozaar, clonidine patch placed on left arm given early. Repeat  BP after 1 hour  675 systolic. MD made aware.with order. Continue to monitor. ?

## 2022-01-08 NOTE — Hospital Course (Signed)
[name] [name] is a pleasant 41 y.o. female with medical history significant for diabetes mellitus, narcotic dependence, ESRD on hemodialysis, history of renal and pancreatic transplantation, hypertension, and poor adherence with medical treatment, now presenting to the emergency department with confusion and somnolence, reporting that she missed dialysis. ?

## 2022-01-09 DIAGNOSIS — G9340 Encephalopathy, unspecified: Secondary | ICD-10-CM | POA: Diagnosis not present

## 2022-01-09 DIAGNOSIS — I161 Hypertensive emergency: Secondary | ICD-10-CM

## 2022-01-09 LAB — CBC
HCT: 31.6 % — ABNORMAL LOW (ref 36.0–46.0)
Hemoglobin: 9.9 g/dL — ABNORMAL LOW (ref 12.0–15.0)
MCH: 30.7 pg (ref 26.0–34.0)
MCHC: 31.3 g/dL (ref 30.0–36.0)
MCV: 98.1 fL (ref 80.0–100.0)
Platelets: 144 10*3/uL — ABNORMAL LOW (ref 150–400)
RBC: 3.22 MIL/uL — ABNORMAL LOW (ref 3.87–5.11)
RDW: 14.8 % (ref 11.5–15.5)
WBC: 4.4 10*3/uL (ref 4.0–10.5)
nRBC: 0 % (ref 0.0–0.2)

## 2022-01-09 LAB — RENAL FUNCTION PANEL
Albumin: 3.6 g/dL (ref 3.5–5.0)
Anion gap: 11 (ref 5–15)
BUN: 42 mg/dL — ABNORMAL HIGH (ref 6–20)
CO2: 28 mmol/L (ref 22–32)
Calcium: 10.2 mg/dL (ref 8.9–10.3)
Chloride: 100 mmol/L (ref 98–111)
Creatinine, Ser: 8.36 mg/dL — ABNORMAL HIGH (ref 0.44–1.00)
GFR, Estimated: 6 mL/min — ABNORMAL LOW (ref >60)
Glucose, Bld: 99 mg/dL (ref 70–99)
Phosphorus: 4.8 mg/dL — ABNORMAL HIGH (ref 2.5–4.6)
Potassium: 5.2 mmol/L — ABNORMAL HIGH (ref 3.5–5.1)
Sodium: 139 mmol/L (ref 135–145)

## 2022-01-09 LAB — GLUCOSE, CAPILLARY: Glucose-Capillary: 102 mg/dL — ABNORMAL HIGH (ref 70–99)

## 2022-01-09 MED ORDER — HYDRALAZINE HCL 20 MG/ML IJ SOLN
20.0000 mg | INTRAMUSCULAR | Status: DC | PRN
  Administered 2022-01-09 (×2): 20 mg via INTRAVENOUS
  Filled 2022-01-09 (×2): qty 1

## 2022-01-09 MED ORDER — NICARDIPINE HCL IN NACL 20-0.86 MG/200ML-% IV SOLN
3.0000 mg/h | INTRAVENOUS | Status: DC
  Administered 2022-01-09: 5 mg/h via INTRAVENOUS
  Administered 2022-01-10: 3 mg/h via INTRAVENOUS
  Filled 2022-01-09 (×4): qty 200

## 2022-01-09 MED ORDER — LABETALOL HCL 5 MG/ML IV SOLN: 20.0000 mg | INTRAVENOUS | Status: DC | PRN

## 2022-01-09 MED ORDER — DOCUSATE SODIUM 100 MG PO CAPS: 100.0000 mg | ORAL_CAPSULE | Freq: Two times a day (BID) | ORAL | Status: DC | PRN

## 2022-01-09 MED ORDER — LABETALOL HCL 300 MG PO TABS
300.0000 mg | ORAL_TABLET | Freq: Three times a day (TID) | ORAL | Status: DC
  Filled 2022-01-09 (×3): qty 1

## 2022-01-09 MED ORDER — LOSARTAN POTASSIUM 50 MG PO TABS
100.0000 mg | ORAL_TABLET | Freq: Once | ORAL | Status: AC
  Administered 2022-01-09: 100 mg via ORAL

## 2022-01-09 MED ORDER — HALOPERIDOL LACTATE 5 MG/ML IJ SOLN
2.0000 mg | Freq: Two times a day (BID) | INTRAMUSCULAR | Status: DC | PRN
  Administered 2022-01-09: 2 mg via INTRAVENOUS
  Filled 2022-01-09 (×2): qty 1

## 2022-01-09 MED ORDER — CLONIDINE HCL 0.2 MG/24HR TD PTWK
0.2000 mg | MEDICATED_PATCH | TRANSDERMAL | Status: DC
  Administered 2022-01-09: 0.2 mg via TRANSDERMAL
  Filled 2022-01-09: qty 1

## 2022-01-09 MED ORDER — LORAZEPAM 2 MG/ML IJ SOLN
0.5000 mg | INTRAMUSCULAR | Status: DC | PRN
  Administered 2022-01-09 – 2022-01-10 (×3): 0.5 mg via INTRAVENOUS
  Filled 2022-01-09 (×3): qty 1

## 2022-01-09 MED ORDER — LABETALOL HCL 5 MG/ML IV SOLN
10.0000 mg | INTRAVENOUS | Status: DC
Start: 2022-01-09 — End: 2022-01-09

## 2022-01-09 MED ORDER — HYDRALAZINE HCL 20 MG/ML IJ SOLN
10.0000 mg | Freq: Once | INTRAMUSCULAR | Status: AC
  Administered 2022-01-09: 10 mg via INTRAVENOUS
  Filled 2022-01-09: qty 1

## 2022-01-09 MED ORDER — HEPARIN SODIUM (PORCINE) 5000 UNIT/ML IJ SOLN
5000.0000 [IU] | Freq: Three times a day (TID) | INTRAMUSCULAR | Status: DC
  Filled 2022-01-09 (×3): qty 1

## 2022-01-09 MED ORDER — POLYETHYLENE GLYCOL 3350 17 G PO PACK: 17.0000 g | PACK | Freq: Every day | ORAL | Status: DC | PRN

## 2022-01-09 MED ORDER — HALOPERIDOL LACTATE 5 MG/ML IJ SOLN
INTRAMUSCULAR | Status: AC
  Filled 2022-01-09: qty 1

## 2022-01-09 MED ORDER — NITROGLYCERIN 2 % TD OINT
1.0000 [in_us] | TOPICAL_OINTMENT | Freq: Four times a day (QID) | TRANSDERMAL | Status: DC
  Administered 2022-01-09 (×2): 1 [in_us] via TOPICAL
  Filled 2022-01-09: qty 30

## 2022-01-09 MED ORDER — HYDRALAZINE HCL 50 MG PO TABS
100.0000 mg | ORAL_TABLET | Freq: Three times a day (TID) | ORAL | Status: DC
  Filled 2022-01-09: qty 2

## 2022-01-09 MED ORDER — HYDRALAZINE HCL 50 MG PO TABS
100.0000 mg | ORAL_TABLET | Freq: Three times a day (TID) | ORAL | Status: DC
  Administered 2022-01-09 – 2022-01-10 (×3): 100 mg via ORAL
  Filled 2022-01-09 (×3): qty 2

## 2022-01-09 MED ORDER — LORAZEPAM 2 MG/ML IJ SOLN
0.5000 mg | Freq: Once | INTRAMUSCULAR | Status: AC
  Administered 2022-01-09: 0.5 mg via INTRAVENOUS
  Filled 2022-01-09: qty 1

## 2022-01-09 MED ORDER — SODIUM ZIRCONIUM CYCLOSILICATE 10 G PO PACK
10.0000 g | PACK | Freq: Once | ORAL | Status: AC
  Administered 2022-01-09: 10 g via ORAL
  Filled 2022-01-09: qty 1

## 2022-01-09 MED ORDER — ORAL CARE MOUTH RINSE
15.0000 mL | Freq: Two times a day (BID) | OROMUCOSAL | Status: DC
  Administered 2022-01-09 – 2022-01-10 (×2): 15 mL via OROMUCOSAL

## 2022-01-09 NOTE — Consult Note (Signed)
? ?NAME:  [name] [name], MRN:  [id number], DOB:  [birth year], LOS: 2 ?ADMISSION DATE:  01/06/2022 CONSULTATION DATE:  01/09/2022 ?REFERRING MD:  Posey Pronto - [facility] CHIEF COMPLAINT:  Hypertensive urgency ? ?History of Present Illness:  ?41 year old woman who presented to [facility] ED 5/5 for AMS/somnolence. Patient reportedly missed HD. 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 Of note, patient has had several admissions this year related to medication misuse (11/2021, responded to Narcan; 12/2021 Ambien misuse) as well as an admission for COVID PNA (11/2021) and SDH (12/2021). ? ?On ED arrival, patient was afebrile, tachycardic and hypertensive to SBP 210s. CT Head with NAICA. Labs were notable for K 6.0, BUN 47, Cr elevated (on HD). EtOH level undetectable. Patient's K was shifted in the ED with Lokelma, Ca gluconate, insulin/D50 and IV labetalol was administered for HTN. Nephrology was consulted for HD (5/6). Admitted to [facility].  ? ?Unfortunately post-HD and administration of home medications, patient remained hypertensive with SBP 200s into 5/8. Refused HD 5/8AM as well as medications. Subsequently appeared progressively more confused/combative with refractory hypertension.  ? ?PCCM consulted for ICU transfer for hypertension management. ? ?Pertinent Medical History:  ? ?Past Medical History:  ?Diagnosis Date  ? Anemia   ? Anxiety   ? ESRD (end stage renal disease) ([place])   ? Henmodialysis  ? Gastroparesis   ? Hypertension   ? Narcotic abuse ([place])   ? Non compliance with medical treatment   ? Renal disorder 2015  ? transplant  ? Vision loss   ? ?Significant Hospital Events: ?Including procedures, antibiotic start and stop dates in addition to other pertinent events   ?5/5 - Admitted for AMS, somnolence. Missed HD. Dialyzed with  some improvement in mental status. Hypertensive to 200s. ?5/6 - 5/7 - Remained hypertensive to 190s-200s despite home medications/IV PRNs. ?5/8 - More confused, combative; refused HD and medications. Hypertensive to 200s. PCCM consulted for ICU admission/Cardene gtt. ? ?Interim History / Subjective:  ?PCCM consulted for hypertensive urgency ?Appears confused, paranoid ?Concerned about RN wearing mask in her room ?Refusing meds from RN, will take select meds with CCM ? ?Objective:  ?Blood pressure (!) 209/103, pulse 97, temperature 98.2 ?F (36.8 ?C), temperature source Oral, resp. rate 14, height '5\' 4"'$  (1.626 m), weight 51 kg, SpO2 95 %. ?   ?   ? ?Intake/Output Summary (Last 24 hours) at 01/09/2022 1604 ?Last data filed at 01/09/2022 0425 ?Gross per 24 hour  ?Intake 230 ml  ?Output 0 ml  ?Net 230 ml  ? ?Filed Weights  ? 01/06/22 2112  ?Weight: 51 kg  ? ?Physical Examination: ?General: Acute-on-chronically ill-appearing woman in NAD. Appears anxious/paranoid. ?HEENT: [place]/AT, anicteric sclera, PERRL, dry mucous membranes. Pale. ?Neuro: Awake, oriented x 2 (self, location). Responds to verbal stimuli. Following commands consistently. Moves all 4 extremities spontaneously. ?CV: RRR, no m/g/r. ?PULM: Breathing even and unlabored on RA. Lung fields CTAB. ?GI: Soft, nontender, nondistended. Normoactive bowel sounds. ?Extremities: Trace symmetric BLE edema noted. RUE AVF with +thrill. ?Skin: Warm/dry, no rashes. Generalized pallor. ? ?Resolved Hospital Problem List:  ? ? ?Assessment & Plan:  ?Hypertensive urgency ?Admitted with hypertensive urgency with SBPs 210s refractory to home medications/HD. ?- Transfer to ICU for BP management ?- Cardene gtt initiation ?- Goal SBP < 160 ?- Continue home medications including losartan, clonidine patch ?-  Continue hydralazine ? ?Acute toxic encephalopathy ?Presented with AMS, confusion, somnolence. Differential includes polypharmacy/medication misuse, HD noncompliance, cyclosporine  toxicity, hypertension with resultant PRES. ?- Trend labs ?- F/u CSA level, if low/normal likely not indicative of toxicity ?- F/u UDS, though likely low yield at this point ?- F/u EEG ?- MRI Brain when able to tolerate, r/o PRES ?- May need additional sedating medications (Haldol vs. Klonopin vs. Precedex) ?- Soft wrist restraints as needed for safety ? ?ESRD on HD ?S/p failed renal transplant allograft 2018, return to HD 2019 ?- Nephrology following, appreciate assistance ?- HD per Nephro ?- Trend BMP ?- Replete electrolytes as indicated ?- Monitor I&Os ?- Avoid nephrotoxic agents as able ? ?T1DM ?S/p pancreas transplant ?History of T1DM s/p SPKT 2014. Functioning pancreas allograft. ?- Continue home immunosuppression ?- Myfortic, Prednisone ?- Resume CSA once toxicity r/o ?- Monitor CBGs if concern for rejection ? ?History of narcotic abuse ?History of narcotic misuse. Recent admission 11/2021 for confusion/AMS, responded to Narcan. Home rx for Percocet. ?- Limit narcotic/sedating medications as able ?- Encourage cessation when able to participate in care ?- Consider [facility] team consult for substance abuse resources ? ?Best Practice: (right click and "Reselect all SmartList Selections" daily)  ? ?Diet/type: Regular consistency (see orders) ?DVT prophylaxis: prophylactic heparin  ?GI prophylaxis: N/A ?Lines: N/A ?Foley:  N/A ?Code Status:  full code ?Last date of multidisciplinary goals of care discussion [Pending] ? ?Labs:  ?CBC: ?Recent Labs  ?Lab 01/06/22 ?2127 01/07/22 ?0005 01/07/22 ?0442 01/08/22 ?0041 01/09/22 ?9937  ?WBC 5.0 4.1 4.2 4.0 4.4  ?HGB 10.0* 9.2* 9.3* 9.8* 9.9*  ?HCT 31.3* 29.9* 29.0* 32.3* 31.6*  ?MCV 96.9 100.0 97.0 98.2 98.1  ?PLT 153 135* 138* 148* 144*  ? ?Basic Metabolic Panel: ?Recent Labs  ?Lab 01/06/22 ?2127 01/07/22 ?0006 01/07/22 ?0442 01/08/22 ?0041 01/09/22 ?1696  ?NA 137 138 138 140 139  ?K 6.0* 5.4* 4.9 5.1 5.2*  ?CL 97* 100 97* 98 100  ?CO2 '26 23 24 26 28  '$ ?GLUCOSE 84 161* 84 107* 99   ?BUN 47* 48* 50* 22* 42*  ?CREATININE 8.84* 9.01* 9.57* 5.51* 8.36*  ?CALCIUM 9.7 9.7 9.8 9.6 10.2  ?PHOS  --  5.3*  --  5.5* 4.8*  ? ?GFR: ?Estimated Creatinine Clearance: 7.2 mL/min (A) (by C-G formula based on SCr of 8.36 mg/dL (H)). ?Recent Labs  ?Lab 01/07/22 ?0005 01/07/22 ?7893 01/08/22 ?0041 01/09/22 ?8101  ?WBC 4.1 4.2 4.0 4.4  ? ?Liver Function Tests: ?Recent Labs  ?Lab 01/06/22 ?2127 01/07/22 ?0006 01/08/22 ?0041 01/09/22 ?7510  ?AST 11*  --   --   --   ?ALT 11  --   --   --   ?ALKPHOS 68  --   --   --   ?BILITOT 0.8  --   --   --   ?PROT 7.1  --   --   --   ?ALBUMIN 3.5 3.2* 3.2* 3.6  ? ?No results for input(s): LIPASE, AMYLASE in the last 168 hours. ?Recent Labs  ?Lab 01/07/22 ?0005  ?AMMONIA 19  ? ?ABG: ?   ?Component Value Date/Time  ? PHART 7.515 (H) 12/23/2021 1006  ? PCO2ART 36.4 12/23/2021 1006  ? PO2ART 56 (L) 12/23/2021 1006  ? HCO3 29.4 (H) 12/23/2021 1006  ? TCO2 30 12/23/2021 1006  ? ACIDBASEDEF 5.2 (H) 01/13/2015 0300  ? O2SAT 91 12/23/2021 1006  ?  ?Coagulation Profile: ?No results for input(s): INR, PROTIME in the last 168 hours. ? ?Cardiac Enzymes: ?No  results for input(s): CKTOTAL, CKMB, CKMBINDEX, TROPONINI in the last 168 hours. ? ?HbA1C: ?Hgb A1c MFr Bld  ?Date/Time Value Ref Range Status  ?11/11/2021 02:25 AM 4.1 (L) 4.8 - 5.6 % Final  ?  Comment:  ?  (NOTE) ?Pre diabetes:          5.7%-6.4% ? ?Diabetes:              >6.4% ? ?Glycemic control for   <7.0% ?adults with diabetes ?  ?11/06/2021 12:15 PM 3.9 (L) 4.8 - 5.6 % Final  ?  Comment:  ?  (NOTE) ?Pre diabetes:          5.7%-6.4% ? ?Diabetes:              >6.4% ? ?Glycemic control for   <7.0% ?adults with diabetes ?  ? ?CBG: ?Recent Labs  ?Lab 01/06/22 ?2125 01/07/22 ?2024  ?GLUCAP 78 100*  ? ?Review of Systems:   ?Patient is encephalopathic and/or intubated. Therefore history has been obtained from chart review.  ? ?Past Medical History:  ?She,  has a past medical history of Anemia, Anxiety, ESRD (end stage renal disease) ([place]),  Gastroparesis, Hypertension, Narcotic abuse ([place]), Non compliance with medical treatment, Renal disorder (2015), and Vision loss.  ? ?Surgical History:  ? ?Past Surgical History:  ?Procedure Laterality Date  ?

## 2022-01-09 NOTE — Progress Notes (Addendum)
HOSPITAL MEDICINE OVERNIGHT EVENT NOTE   ? ?Nursing reports the patient continues to exhibit substantial hypertension with systolic blood pressures greater than 190. ? ?Chart reviewed, patient has been exhibiting resistant hypertension and is on numerous antihypertensives.  Note that hydralazine is at 50 mg every 8 hours.  We will increase this to 100 mg every 8 hours with next dose to be given at 6 AM.  We will give an additional 10 mg of intravenous hydralazine now in the meantime and assess for response. ? ?[name] Emerald  MD ?[name]  ? ?ADDENDUM 5/8 4AM ? ?Blood pressures have continued to be extremely resistant.  Giving additional dose of intravenous labetalol.  Patient to undergo hemodialysis later this morning.  Patient is exhibiting no change in mentation, denies chest pain. ? ?[name] ? ? ? ? ? ? ? ? ? ? ? ? ?

## 2022-01-09 NOTE — Progress Notes (Addendum)
?[facility] ?Progress Note  ? ?Subjective:    ?Seen and examined patient at bedside. BP was 210s > 190s and refusing po antiHTN meds, refusing HD. Will not offer any explanations. ? ?Objective ?Vitals:  ? 01/09/22 0302 01/09/22 0308 01/09/22 0425 01/09/22 7858  ?BP: (!) 210/99  (!) 212/93 (!) 193/86  ?Pulse:   98   ?Resp: (!) '23 20 15   '$ ?Temp:   98.2 ?F (36.8 ?C)   ?TempSrc:   Oral   ?SpO2: 96%  96%   ?Weight:      ?Height:      ? ?Physical Exam ?General: Awake and alert, localizes to me but doesn't answer anything but yes/no questions; On RA; NAD  ?Heart: S1 and S2; No murmurs, gallops, or rubs ?Lungs: Clear throughout ?Abdomen: Soft and non-tender ?Extremities: No edema BLLE ?Dialysis Access: RUE AVF (+) B/T  ? ?Filed Weights  ? 01/06/22 2112  ?Weight: 51 kg  ? ? ?Intake/Output Summary (Last 24 hours) at 01/09/2022 0749 ?Last data filed at 01/09/2022 0425 ?Gross per 24 hour  ?Intake 430 ml  ?Output 0 ml  ?Net 430 ml  ? ? ? ?Additional Objective ?Labs: ?Basic Metabolic Panel: ?Recent Labs  ?Lab 01/07/22 ?0006 01/07/22 ?8502 01/08/22 ?0041 01/09/22 ?7741  ?NA 138 138 140 139  ?K 5.4* 4.9 5.1 5.2*  ?CL 100 97* 98 100  ?CO2 '23 24 26 28  '$ ?GLUCOSE 161* 84 107* 99  ?BUN 48* 50* 22* 42*  ?CREATININE 9.01* 9.57* 5.51* 8.36*  ?CALCIUM 9.7 9.8 9.6 10.2  ?PHOS 5.3*  --  5.5* 4.8*  ? ? ?Liver Function Tests: ?Recent Labs  ?Lab 01/06/22 ?2127 01/07/22 ?0006 01/08/22 ?0041 01/09/22 ?2878  ?AST 11*  --   --   --   ?ALT 11  --   --   --   ?ALKPHOS 68  --   --   --   ?BILITOT 0.8  --   --   --   ?PROT 7.1  --   --   --   ?ALBUMIN 3.5 3.2* 3.2* 3.6  ? ? ?No results for input(s): LIPASE, AMYLASE in the last 168 hours. ?CBC: ?Recent Labs  ?Lab 01/06/22 ?2127 01/07/22 ?0005 01/07/22 ?0442 01/08/22 ?0041 01/09/22 ?6767  ?WBC 5.0 4.1 4.2 4.0 4.4  ?HGB 10.0* 9.2* 9.3* 9.8* 9.9*  ?HCT 31.3* 29.9* 29.0* 32.3* 31.6*  ?MCV 96.9 100.0 97.0 98.2 98.1  ?PLT 153 135* 138* 148* 144*  ? ? ?Blood Culture ?   ?Component Value Date/Time   ? SDES BLOOD BLOOD LEFT ARM 12/23/2021 0950  ? SPECREQUEST  12/23/2021 0950  ?  BOTTLES DRAWN AEROBIC AND ANAEROBIC Blood Culture adequate volume  ? CULT  12/23/2021 0950  ?  NO GROWTH 5 DAYS ?Performed at [facility], [place] [address], [place], [place] [address] ?  ? REPTSTATUS 12/28/2021 FINAL 12/23/2021 0950  ? ? ?Cardiac Enzymes: ?No results for input(s): CKTOTAL, CKMB, CKMBINDEX, TROPONINI in the last 168 hours. ?CBG: ?Recent Labs  ?Lab 01/06/22 ?2125 01/07/22 ?2024  ?GLUCAP 78 100*  ? ? ?Iron Studies: No results for input(s): IRON, TIBC, TRANSFERRIN, FERRITIN in the last 72 hours. ?Lab Results  ?Component Value Date  ? INR 1.3 (H) 12/01/2021  ? INR 1.2 12/20/2018  ? INR 1.25 11/16/2011  ? ?Studies/Results: ?No results found. ? ?Medications: ? sodium chloride    ? sodium chloride    ? sodium chloride    ? sodium chloride    ? ? amLODipine  10 mg Oral Daily  ? aspirin  81 mg Oral Daily  ? calcium acetate  2,001 mg Oral TID WC  ? Chlorhexidine Gluconate Cloth  6 each Topical Q0600  ? cloNIDine  0.1 mg Transdermal Q Sun  ? ferric citrate  630 mg Oral TID WC  ? hydrALAZINE  100 mg Oral Q8H  ? labetalol  300 mg Oral Q8H  ? losartan  100 mg Oral Daily  ? mycophenolate  540 mg Oral BID  ? predniSONE  15 mg Oral Q breakfast  ? sodium chloride flush  3 mL Intravenous Q12H  ? ? ?Dialysis Orders: ?AF MWF  3h 28mn  400/500  53.5kg  2/2 bath  RUA AVF Hep none ? - last HD 5/03, 56.2 > 54.3kg ? - last Hb 8.4 on 5/03 ? - mircera 225 q2, last 5/03, due 5/17 ? - hectorol 8 ug tiw IV ? - sensipar '90mg'$  tiw po ? ?Assessment/Plan: ?Acute encephalopathy - w/u in progress. This has been a recurrent issue. NH3 is normal here and pt's HD compliance is good so not uremic. Doesn't appear septic. Cyclosporine neurotoxicity could be a possibility. Trough CyA level is pending - collected 5/7 around midnight.  ? BP related this AM > needs better control, see below. ?ESRD - gets HD on MWF. Had HD here 5/6 off schedule. HD Monday 01/09/22  - refusing HD currently, will try again later.  ?BP/ vol - no sig vol excess, UF 2-3 L w/ HD as tolerated ?HTN - BP high this AM and she's refusing po meds - has IV hydralazine and labetalol ordered, has clonidine patch.  If BP isn't coming down she may need a continuous infusion.   Cont to encourage po med intake. Rev'd recent outpt BPs - somewhat variable but generally ok on meds.  ?Anemia ckd - Hb 9.9, next esa due on 5/17.  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 Continue binders.  ? ?LJannifer HickMD ?[facility] ?Pager [phone]? ?ADDENDUM:  Spoke to hospitalist on phone this AM re: HTN, AMS.  He's consulting neurology and is at the bedside currently following up the BP.  If unable to control BP with oral meds +/- IV PRNs will need IV infusion, possible ICU transfer for this.  He's in agreement.  ? ? ?

## 2022-01-09 NOTE — Progress Notes (Signed)
?   01/09/22 1100  ?Assess: MEWS Score  ?BP (!) 189/85  ?Pulse Rate (!) 103  ?ECG Heart Rate (!) 103  ?Resp (!) 27  ?SpO2 99 %  ?Assess: MEWS Score  ?MEWS Temp 0  ?MEWS Systolic 0  ?MEWS Pulse 1  ?MEWS RR 2  ?MEWS LOC 0  ?MEWS Score 3  ?MEWS Score Color Yellow  ?Assess: if the MEWS score is Yellow or Red  ?Were vital signs taken at a resting state? Yes  ?Focused Assessment No change from prior assessment  ?Early Detection of Sepsis Score *See Row Information* Low  ?MEWS guidelines implemented *See Row Information* Yes  ?Treat  ?MEWS Interventions Administered scheduled meds/treatments  ?Take Vital Signs  ?Increase Vital Sign Frequency  Yellow: Q 2hr X 2 then Q 4hr X 2, if remains yellow, continue Q 4hrs  ?Escalate  ?MEWS: Escalate Yellow: discuss with charge nurse/RN and consider discussing with provider and RRT  ?Notify: Charge Nurse/RN  ?Name of Charge Nurse/RN Notified Mickel Baas  ?Date Charge Nurse/RN Notified 01/09/22  ?Time Charge Nurse/RN Notified 1100  ?Notify: Provider  ?Provider Name/Title Dr. Posey Pronto  ?Date Provider Notified 01/09/22  ?Time Provider Notified 1045  ?Notification Type Page  ?Notification Reason Change in status ?(combative/paranoid, refusing meds, BP high)  ?Provider response At bedside  ?Date of Provider Response 01/09/22  ?Time of Provider Response 1050  ?Notify: Rapid Response  ?Name of Rapid Response RN Notified Wilburn Cornelia  ?Date Rapid Response Notified 01/09/22  ?Time Rapid Response Notified 1030  ?Document  ?Patient Outcome Not stable and remains on department  ?Progress note created (see row info) Yes  ? ? ?

## 2022-01-09 NOTE — Progress Notes (Signed)
Managed to get out of bed despite bil wrist restraint in use, able  to rip off  the thermometer holder on the wall.  Very confused . Assited back to bed. Bil wrist restraint reapplied. Bp-  601 systolic. Labetalol iv given.  Continue to monitor. ?

## 2022-01-09 NOTE — Consult Note (Signed)
?  [name] [name] is a 41 year old female with medical history significant for diabetes mellitus, narcotic dependence, end-stage renal disease on hemodialysis, history of renal and pancreatic transplant, hypertension, poor adherence with medical treatment who presented to the emergency department with confusion and somnolence.  Patient developed worsening confusion, disorientation, and psychosis in the setting of hypertensive encephalopathy. ? ?-Psychiatric consult received for concern for acute psychosis, please assist in management.  Psychiatric nurse practitioner was unable to assess patient due to recently receiving sedating medication for reasons listed above.  Patient is scheduled for Ativan 0.5 mg every 4 hours as needed for anxiety and agitation.  Patient does appear to show moderate therapeutic response to Ativan 0.5 IV at this time. ? ?-Due to new onset of psychosis and confusion, we will also treat with Haldol 2 mg twice daily as needed for agitation and psychosis, confusion.  Recent EKG obtained on May 8, shows QTc of 456.  Patient was history of hypertension, we will continue to closely monitor with serial EKGs if necessary. ? ? ?Psychiatry will attempt to assess tomorrow.  The above information was discussed with [name] hospitalist Dr. Posey Pronto who is in agreement. ? ?

## 2022-01-09 NOTE — Progress Notes (Addendum)
Patient adamantly refusing to take her AM dose of scheduled hydralazine.  Patient repeatedly educated by multiple RNs on floor on importance of taking this medication with her high BP of 193/86. MD notified.  Report given to HD RN, she will be called for within the next 20 minutes.  ? ?Patient now refusing HD.  Patient educated on importance of HD, she still refuses stating "I don't feel like going".  MD and HD RN made aware.  ?

## 2022-01-09 NOTE — Progress Notes (Signed)
730 am- very confused,and paranoia asking why we are using gloves and mask., refused to have v/s be taken and pulled out telemetry monitor leads. Threatened to hit when getting near and touch her. Md made aware and seen pt at once  and gave orders. Rapid response made aware and came at once.   ?1020 am- ativan  0.5 mg  iv given with 4 person assist to hold her arm.  Attempted to grab undersigned at face while applying bil wrist restraint . BP- taken- 209/104.hr-103 labetalol iv given. After 30 min [id number] systolic. Hydralazine iv and clonidine .'2mg'$  patch ,old one removed  applied to left arm continue to monitor. ?

## 2022-01-09 NOTE — Progress Notes (Signed)
Pt. Requested CCM MD  to place the dose of cozaar to her mouth. After MD left started pulling out telemetry leads and blood pressure cuff,.  Haldol 2 mg iv given. Continue to monitor. Sitter at bedside. ?

## 2022-01-09 NOTE — Progress Notes (Signed)
Transferred to 2Mr room 6 by bed, report given to RN belongings with the pt. ?

## 2022-01-09 NOTE — Consult Note (Addendum)
Neurology Consultation ? ?Reason for Consult: AMS ?Referring Physician: Dr. Posey Pronto ? ?CC: Missed HD  ? ?History is obtained from:medical record ? ?HPI: [name] [name] is a 41 y.o. female with past medical history of ESRD on HD, s/p renal transplant, HTN, DM, narcotic use, anxiety and noncompliance with medical treatments presented to the ED  on 5/6 with confusion and reporting missed HD days. She underwent HD and notes indicating her mental status was improving. RN at bedside, reports that yesterday patient was alert, oriented and cooperative, however during the night she became very aggressive, paranoid, delusional and confused. She is refusing HD and all Po meds. Recently received ativan. She needed to be restrained in bilateral wrists. She also has been very hypertensive since admission with ranges 203-230/93-107.  ?On exam, patient is in bilateral restraints, she is calm. She tells me  "I can see through your body". She is not cooperative with exam and questions. When asked her name and age she wont tell me. When asked, what month we are in she tells me "May", when asked place, she tells me "hospital", when asked city she says "[place]"  Unable to tell me year, president or why she is here, when asked these questions she turns away head. Can name a few objects, "cup: and "pen". She will follow few simple commands. No family at bedside. Neurology consulted for assistance  ? ?ROS:  Unable to obtain due to altered mental status.  ? ?Past Medical History:  ?Diagnosis Date  ? Anemia   ? Anxiety   ? ESRD (end stage renal disease) ([place])   ? Henmodialysis  ? Gastroparesis   ? Hypertension   ? Narcotic abuse ([place])   ? Non compliance with medical treatment   ? Renal disorder 2015  ? transplant  ? Vision loss   ? ? ?Family History  ?Problem Relation Age of Onset  ? Hypertension Father   ? ? ? ?Social History:  ? reports that she has quit smoking. Her smoking use included pipe. She has never used smokeless tobacco.  She reports that she does not drink alcohol and does not use drugs. ? ?Medications ? ?Current Facility-Administered Medications:  ?  0.9 %  sodium chloride infusion, 100 mL, Intravenous, PRN, Opyd, Ilene Qua, MD ?  0.9 %  sodium chloride infusion, 100 mL, Intravenous, PRN, Opyd, Ilene Qua, MD ?  0.9 %  sodium chloride infusion, 100 mL, Intravenous, PRN, Tobie Poet E, NP ?  0.9 %  sodium chloride infusion, 100 mL, Intravenous, PRN, Adelfa Koh, NP ?  acetaminophen (TYLENOL) tablet 650 mg, 650 mg, Oral, Q6H PRN **OR** acetaminophen (TYLENOL) suppository 650 mg, 650 mg, Rectal, Q6H PRN, Opyd, Timothy S, MD ?  alteplase (CATHFLO ACTIVASE) injection 2 mg, 2 mg, Intracatheter, Once PRN, Opyd, Ilene Qua, MD ?  amLODipine (NORVASC) tablet 10 mg, 10 mg, Oral, Daily, Opyd, Ilene Qua, MD, 10 mg at 01/08/22 0740 ?  aspirin chewable tablet 81 mg, 81 mg, Oral, Daily, Opyd, Ilene Qua, MD, 81 mg at 01/08/22 0740 ?  calcium acetate (PHOSLO) capsule 2,001 mg, 2,001 mg, Oral, TID WC, Opyd, Ilene Qua, MD, 2,001 mg at 01/08/22 1149 ?  calcium acetate (PHOSLO) capsule 667 mg, 667 mg, Oral, PRN, Vianne Bulls, MD, 667 mg at 01/07/22 2542 ?  Chlorhexidine Gluconate Cloth 2 % PADS 6 each, 6 each, Topical, Q0600, Opyd, Ilene Qua, MD, 6 each at 01/09/22 0556 ?  cloNIDine (CATAPRES - Dosed in mg/24 hr) patch 0.2  mg, 0.2 mg, Transdermal, Q Sun, Patel, Pranav M, MD, 0.2 mg at 01/09/22 1030 ?  ferric citrate (AURYXIA) tablet 630 mg, 630 mg, Oral, TID WC, Opyd, Ilene Qua, MD, 630 mg at 01/08/22 1152 ?  heparin injection 1,000 Units, 1,000 Units, Dialysis, PRN, Opyd, Ilene Qua, MD ?  hydrALAZINE (APRESOLINE) injection 20 mg, 20 mg, Intravenous, Q4H PRN, Justin Mend, MD, 20 mg at 01/09/22 1034 ?  hydrALAZINE (APRESOLINE) tablet 100 mg, 100 mg, Oral, Q8H, Lavina Hamman, MD ?  labetalol (NORMODYNE) injection 20 mg, 20 mg, Intravenous, Q2H PRN, Opyd, Ilene Qua, MD, 20 mg at 01/09/22 1023 ?  labetalol (NORMODYNE) tablet 300  mg, 300 mg, Oral, Q8H, Lavina Hamman, MD ?  lidocaine (PF) (XYLOCAINE) 1 % injection 5 mL, 5 mL, Intradermal, PRN, Opyd, Ilene Qua, MD ?  lidocaine-prilocaine (EMLA) cream 1 application., 1 application., Topical, PRN, Opyd, Ilene Qua, MD ?  LORazepam (ATIVAN) injection 0.5 mg, 0.5 mg, Intravenous, Q4H PRN, Lavina Hamman, MD ?  losartan (COZAAR) tablet 100 mg, 100 mg, Oral, Daily, Opyd, Ilene Qua, MD, 100 mg at 01/08/22 0739 ?  mycophenolate (MYFORTIC) EC tablet 540 mg, 540 mg, Oral, BID, Opyd, Ilene Qua, MD, 540 mg at 01/08/22 2113 ?  ondansetron (ZOFRAN) tablet 4 mg, 4 mg, Oral, Q6H PRN **OR** ondansetron (ZOFRAN) injection 4 mg, 4 mg, Intravenous, Q6H PRN, Opyd, Ilene Qua, MD, 4 mg at 01/07/22 2440 ?  pentafluoroprop-tetrafluoroeth (GEBAUERS) aerosol 1 application., 1 application., Topical, PRN, Opyd, Ilene Qua, MD ?  predniSONE (DELTASONE) tablet 15 mg, 15 mg, Oral, Q breakfast, Opyd, Ilene Qua, MD, 15 mg at 01/08/22 0738 ?  sodium chloride flush (NS) 0.9 % injection 3 mL, 3 mL, Intravenous, Q12H, Opyd, Ilene Qua, MD, 3 mL at 01/08/22 2114 ? ? ?Exam: ?Current vital signs: ?BP (!) 189/85   Pulse (!) 103   Temp 98.2 ?F (36.8 ?C) (Oral)   Resp (!) 27   Ht '5\' 4"'$  (1.626 m)   Wt 51 kg   SpO2 99%   BMI 19.30 kg/m?  ?Vital signs in last 24 hours: ?Temp:  [98.1 ?F (36.7 ?C)-98.6 ?F (37 ?C)] 98.2 ?F (36.8 ?C) (05/08 0425) ?Pulse Rate:  [94-105] 103 (05/08 1100) ?Resp:  [13-27] 27 (05/08 1100) ?BP: (189-212)/(85-105) 189/85 (05/08 1100) ?SpO2:  [96 %-99 %] 99 % (05/08 1100) ? ?GENERAL: Awake, alert in NAD ?HEENT: - Normocephalic and atraumatic, dry mm ?LUNGS - Clear to auscultation bilaterally with no wheezes ?CV - S1S2 RRR, no m/r/g, equal pulses bilaterally. ?ABDOMEN - Soft, nontender, nondistended with normoactive BS ?Ext: warm, well perfused, intact peripheral pulses, no edema ? ?NEURO:  ?Mental Status: Alert, awake. Poor attention.  can state "hospital" and "May" when asked for place and month. Will not tell  me name or age. Will name a few items, "cup" and "pen" Speech is clear. Will follow some simple commands at times. Other times she will turn her head away and not acknowledge me  ?Cranial Nerves: PERRL 2 mm/brisk. EOMI, visual fields full, no facial asymmetry, facial sensation intact, hearing intact, tongue/uvula/soft palate midline, normal sternocleidomastoid and trapezius muscle strength. No evidence of tongue atrophy or fibrillations ?Motor: 5/5 in all 4 extremities  ?Tone: is normal and bulk is normal ?Sensation- Intact to light touch bilaterally ?Coordination: unable to assess  ?Gait- deferred ? ?Labs ?I have reviewed labs in epic and the results pertinent to this consultation are: ?K 5.2 ?BUN 42 ?Cr 8.36 ?Hgb 9.9 ?Ammonia 19 ?Cyclosporine level pending  ? ?  Imaging ?I have reviewed the images obtained: ? ?CT-head 5/6: ?Chronic changes. No acute intracranial CT abnormality or interval changes ? ?Assessment:  ?[name] [name] is a 41 y.o. female with past medical history of ESRD on HD, s/p renal transplant, HTN, DM, narcotic use, anxiety and noncompliance with medical treatments presented to the ED  on 5/6 with confusion and reporting missed HD days. She underwent HD and notes indicating her mental status was improving. RN at bedside, reports that yesterday patient was alert, oriented and cooperative, however during the night she became very aggressive, paranoid, delusional and confused. She is refusing HD and all Po meds. Recently received ativan. She needed to be restrained in bilateral wrists. She also has been very hypertensive since admission with ranges 203-230/93-107.  ? ?Acute Encephalopathy, likely multifactorial due to persistently hypertensive in the 169'C,  and metabolic derangements vs PRES vs seizures vs cyclosporine toxicity ? ?Recommendations: ?- continue to treat BP may require IV antihypertensive gtt, per primary  ?- Correct electrolyte and metabolic disturbances per primary  ?- MRI brain if  able ?- Check EEG  ?- neurology will continue to follow  ? ?Beulah Gandy DNP, ACNPC-AG  ?

## 2022-01-09 NOTE — Progress Notes (Signed)
?   01/09/22 0308  ?Assess: MEWS Score  ?ECG Heart Rate (!) 104  ?Resp 20  ?Assess: MEWS Score  ?MEWS Temp 0  ?MEWS Systolic 2  ?MEWS Pulse 1  ?MEWS RR 0  ?MEWS LOC 0  ?MEWS Score 3  ?MEWS Score Color Yellow  ?Assess: if the MEWS score is Yellow or Red  ?Were vital signs taken at a resting state? No  ?Focused Assessment No change from prior assessment  ?Early Detection of Sepsis Score *See Row Information* Low  ?MEWS guidelines implemented *See Row Information* No, previously yellow, continue vital signs every 4 hours  ?Treat  ?MEWS Interventions Escalated (See documentation below)  ?Escalate  ?MEWS: Escalate Yellow: discuss with charge nurse/RN and consider discussing with provider and RRT  ?Notify: Charge Nurse/RN  ?Name of Charge Nurse/RN Notified Warren Danes, RN  ?Date Charge Nurse/RN Notified 01/09/22  ?Time Charge Nurse/RN Notified 0308  ?Notify: Provider  ?Provider Name/Title Dr. Cyd Silence  ?Date Provider Notified 01/09/22  ?Time Provider Notified [phone]  ?Notification Type Page  ?Notification Reason Change in status ?(elevated BP)  ?Notify: Rapid Response  ?Name of Rapid Response RN Notified Waunita Schooner RN  ?Date Rapid Response Notified 01/09/22  ?Time Rapid Response Notified 0300  ?Document  ?Patient Outcome Not stable and remains on department  ?Progress note created (see row info) Yes  ? ? ?

## 2022-01-09 NOTE — Progress Notes (Signed)
?  Progress Note ?Patient: [name] [name] JJO:[id number] DOB: 11/27/1980 DOA: 01/06/2022  ?DOS: the patient was seen and examined on 01/09/2022 ? ?Brief hospital course: ?[name] [name] is a pleasant 41 y.o. female with medical history significant for diabetes mellitus, narcotic dependence, ESRD on hemodialysis, history of renal and pancreatic transplantation, hypertension, and poor adherence with medical treatment, now presenting to the emergency department with confusion and somnolence, reporting that she missed dialysis. ?Currently being transferred to the ICU for further care due to her ongoing encephalopathy, refusal of the hemodialysis as well as high blood pressure requiring aggressive intervention most likely an infusion. ? ?Assessment and Plan: ?* Acute encephalopathy ?Combination of hypertensive encephalopathy Acute metabolic encephalopathy in the setting of missing HD. ?Hypertensive emergency ?Possibility of a psychotic event contributed out. ?Possibility of cyclosporine toxicity cannot be ruled out as well. ?Underwent HD. ?Mentation waxing and waning. ?On the morning of 5/8 the patient is not cooperative and will not take any p.o. medication. ?Severely agitated requiring restraints. ?PCCM consulted for further assistance and the patient was transferred to the ICU for aggressive management of her encephalopathy. ?Psychiatry and neurology were also consulted for further assistance. ? ?ESRD on dialysis [name]) ?Hyperkalemia. ?Nephrology consult appreciated. ?Refusing HD. ?Hyperkalemia, corrected with HD. ? ?Narcotic dependence ([name]) ?Currently holding all psychotropic medications. ? ?Immunosuppression ([name] [name]) ?History of renal and pancreatic transplant in 2014. ?Currently on the list for secondary renal transplant consideration. ?Patient is on cyclosporine, prednisone and CellCept. ?Her presentation can also be secondary to cyclosporine toxicity. ?Patient has not followed up with pancreatic transplant team  since 2018. ?Currently continue prednisone and CellCept.  Outpatient follow-up with pancreatic transplant team. ?Cyclosporine currently on hold.  Levels currently pending. ? ?Type 2 diabetes mellitus with other diabetic kidney complication ([name]) ?Hemoglobin A1c 4.1. ?Currently diet controlled  ?monitor. ? ?Subjective: No nausea no vomiting.  Severely agitated this morning refusing all medication.  Refusing all intervention as well as care plan. ?Also refusing to hemodialysis. ?Somewhat agreeable after receiving IV Ativan but remains agitated after that again. ? ?Physical Exam: ?Vitals:  ? 01/09/22 1930 01/09/22 1938 01/09/22 1945 01/09/22 2000  ?BP: 138/75  137/77 (!) 141/81  ?Pulse: (!) 115  (!) 112 (!) 113  ?Resp: (!) 24  (!) 25 17  ?Temp:  98.2 ?F (36.8 ?C)    ?TempSrc:  Oral    ?SpO2: 100%  100% 100%  ?Weight:      ?Height:      ? ?General: Appear in moderate distress; no visible Abnormal Neck Mass Or lumps, Conjunctiva normal ?Cardiovascular: S1 and S2 Present, no Murmur, ?Respiratory: good respiratory effort, Bilateral Air entry present and CTA, no Crackles, no wheezes ?Abdomen: Bowel Sound present,  ?Extremities: no Pedal edema ?Neurology: alert and not oriented to time, place, and person ?Gait not checked due to patient safety concerns  ? ?Data Reviewed: ?I have Reviewed nursing notes, Vitals, and Lab results since pt's last encounter. Pertinent lab results CBC and BMP ?I have ordered test including CBC and BMP ?I have discussed pt's care plan and test results with nephrology, neurology, psychiatry, PCCM.  ? ?Family Communication: None at bedside. ? ?Disposition: ?Status is: Inpatient ?Remains inpatient appropriate because: Severely confused and hypotensive requiring aggressive intervention.  Transferred to the ICU. ? ?Author: ?Berle Mull, MD ?01/09/2022 8:20 PM ? ?Please look on [email or web address] to find out who is on call. ?

## 2022-01-09 NOTE — Progress Notes (Signed)
Pt receives out-pt HD at [facility] on MWF. Pt arrives between 6:00-6:15 for 6:30 chair time. Will assist as needed.  ? ?[name] [name] ?Renal Navigator ?[phone] ?

## 2022-01-10 ENCOUNTER — Inpatient Hospital Stay (HOSPITAL_COMMUNITY): Payer: Medicare Other

## 2022-01-10 DIAGNOSIS — R4182 Altered mental status, unspecified: Secondary | ICD-10-CM

## 2022-01-10 LAB — RENAL FUNCTION PANEL
Albumin: 3.4 g/dL — ABNORMAL LOW (ref 3.5–5.0)
Anion gap: 16 — ABNORMAL HIGH (ref 5–15)
BUN: 54 mg/dL — ABNORMAL HIGH (ref 6–20)
CO2: 26 mmol/L (ref 22–32)
Calcium: 9.7 mg/dL (ref 8.9–10.3)
Chloride: 98 mmol/L (ref 98–111)
Creatinine, Ser: 10.92 mg/dL — ABNORMAL HIGH (ref 0.44–1.00)
GFR, Estimated: 4 mL/min — ABNORMAL LOW (ref >60)
Glucose, Bld: 80 mg/dL (ref 70–99)
Phosphorus: 5.1 mg/dL — ABNORMAL HIGH (ref 2.5–4.6)
Potassium: 4.9 mmol/L (ref 3.5–5.1)
Sodium: 140 mmol/L (ref 135–145)

## 2022-01-10 LAB — CBC
HCT: 32.5 % — ABNORMAL LOW (ref 36.0–46.0)
Hemoglobin: 9.8 g/dL — ABNORMAL LOW (ref 12.0–15.0)
MCH: 30.2 pg (ref 26.0–34.0)
MCHC: 30.2 g/dL (ref 30.0–36.0)
MCV: 100 fL (ref 80.0–100.0)
Platelets: 147 10*3/uL — ABNORMAL LOW (ref 150–400)
RBC: 3.25 MIL/uL — ABNORMAL LOW (ref 3.87–5.11)
RDW: 15.7 % — ABNORMAL HIGH (ref 11.5–15.5)
WBC: 4.4 10*3/uL (ref 4.0–10.5)
nRBC: 0 % (ref 0.0–0.2)

## 2022-01-10 LAB — GLUCOSE, CAPILLARY
Glucose-Capillary: 84 mg/dL (ref 70–99)
Glucose-Capillary: 89 mg/dL (ref 70–99)
Glucose-Capillary: 77 mg/dL (ref 70–99)

## 2022-01-10 LAB — MRSA NEXT GEN BY PCR, NASAL: MRSA by PCR Next Gen: NOT DETECTED

## 2022-01-10 LAB — MAGNESIUM: Magnesium: 2.8 mg/dL — ABNORMAL HIGH (ref 1.7–2.4)

## 2022-01-10 IMAGING — CT CT HEAD W/O CM
3 series · 16 of 47 positions shown, 19 images · non-contrast
Comparison: [DATE]

CLINICAL DATA: Mental status changes.  Hypertensive emergency.



[Series 4: head 5.0 h30s · axial · 0.37mm/px · z∈[-92,+38]mm · 10 of 32 slices shown, 13 images]
[im 3/32  brain]
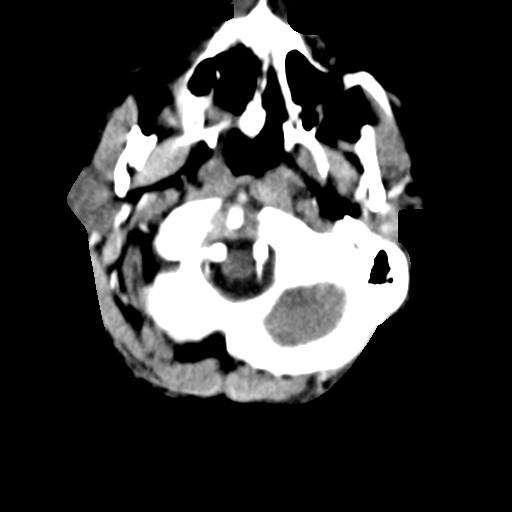
[im 3/32  bone]
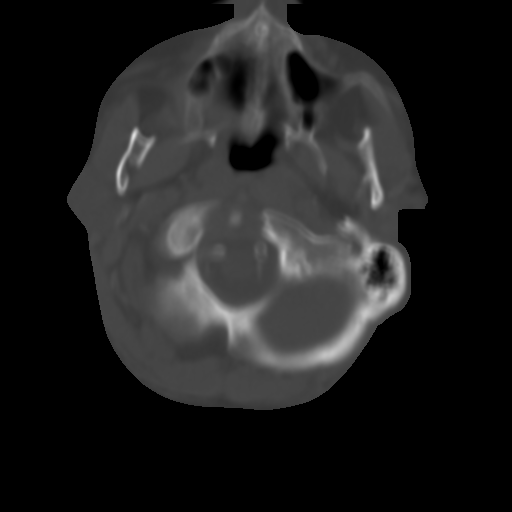
[im 6/32  brain]
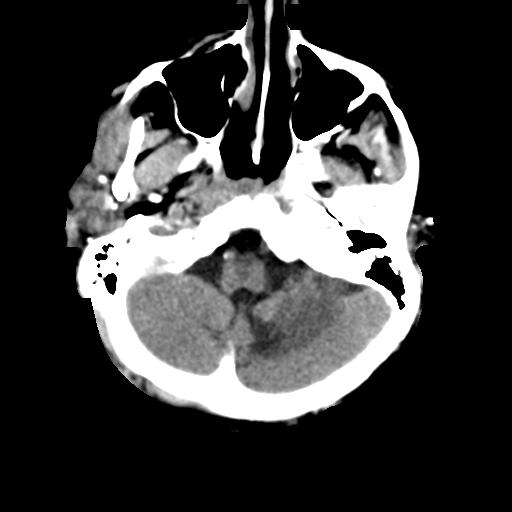
[im 9/32  brain]
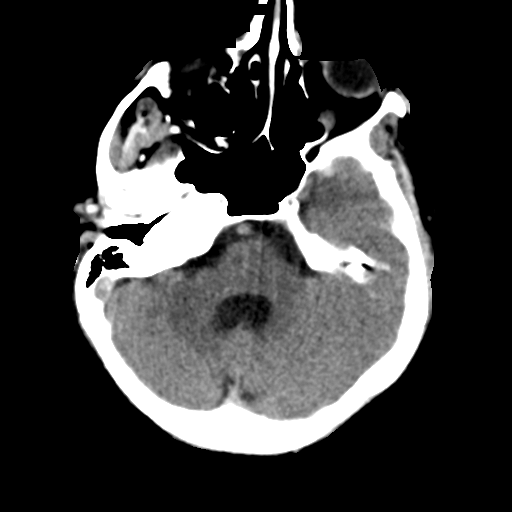
[im 11/32  brain]
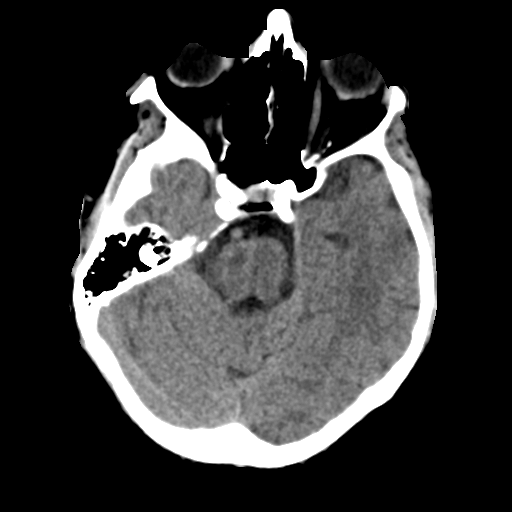
[im 14/32  brain]
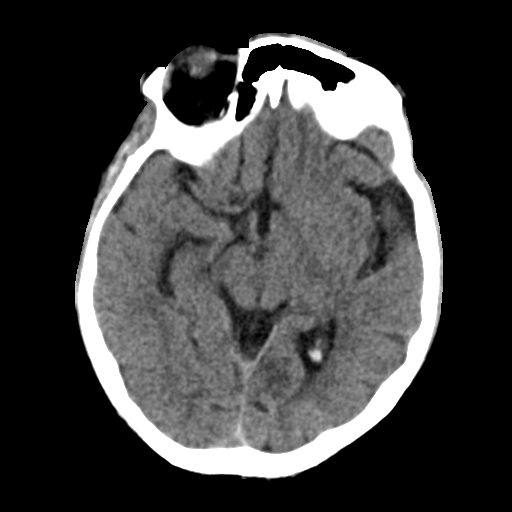
[im 14/32  bone]
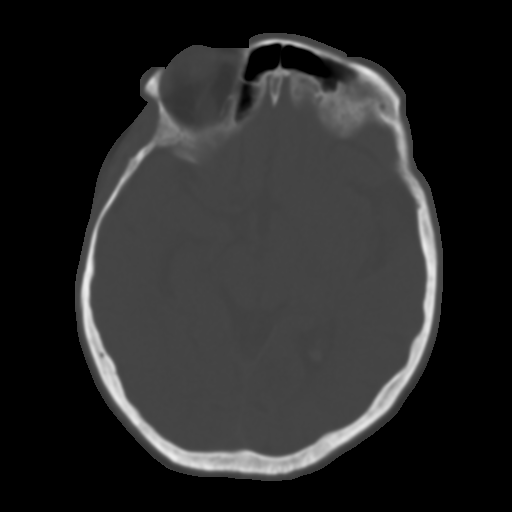
[im 18/32  brain]
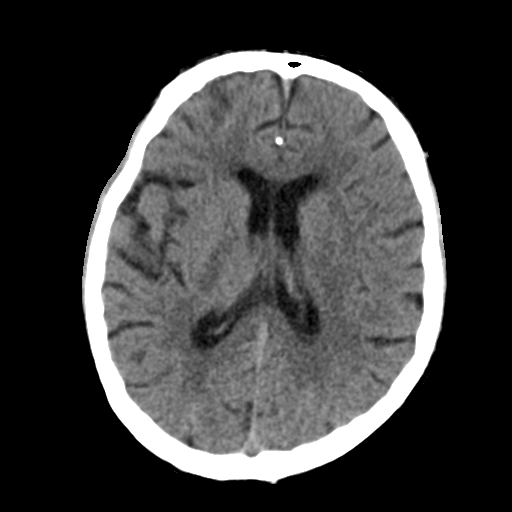
[im 21/32  brain]
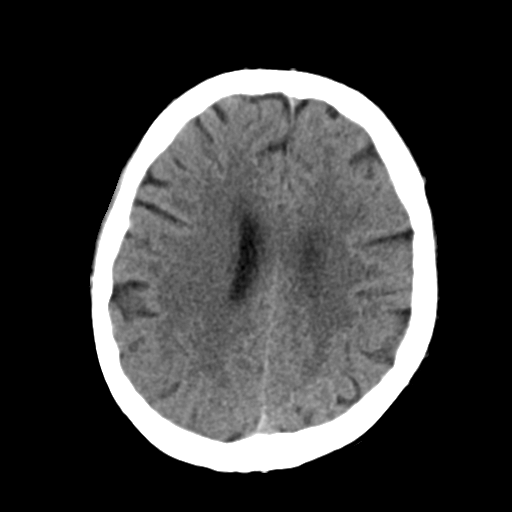
[im 24/32  brain]
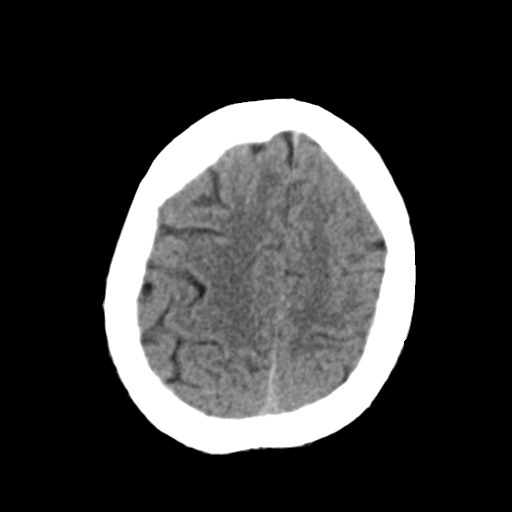
[im 26/32  brain]
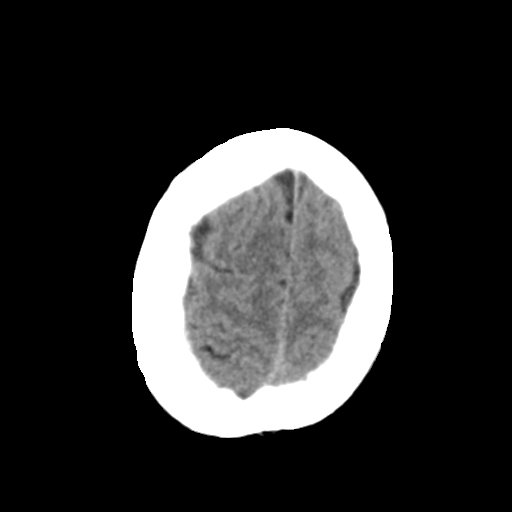
[im 26/32  bone]
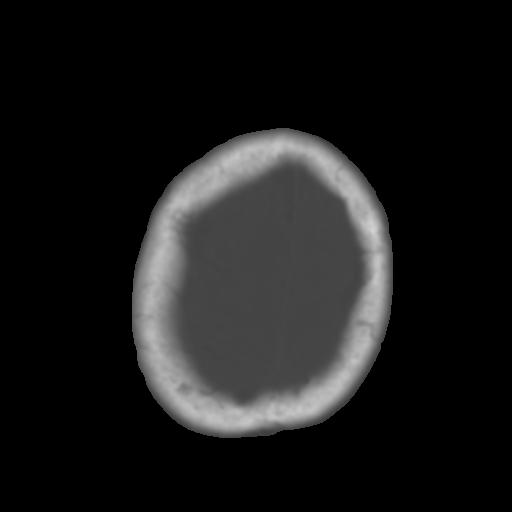
[im 29/32  brain]
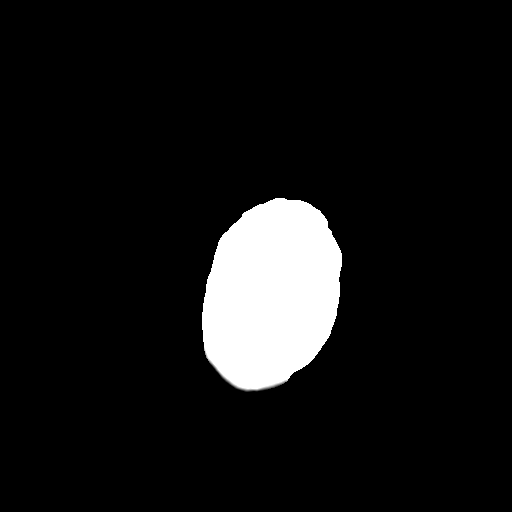

[Series 5: head 3.0 mpr cor · coronal · 0.31mm/px · 3 of 67 slices shown]
[im 23/67  brain]
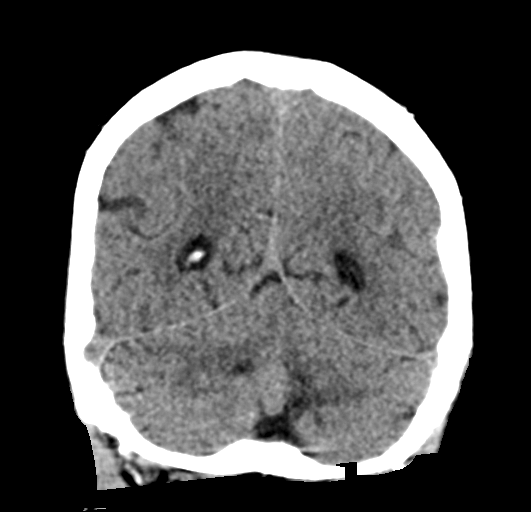
[im 30/67  brain]
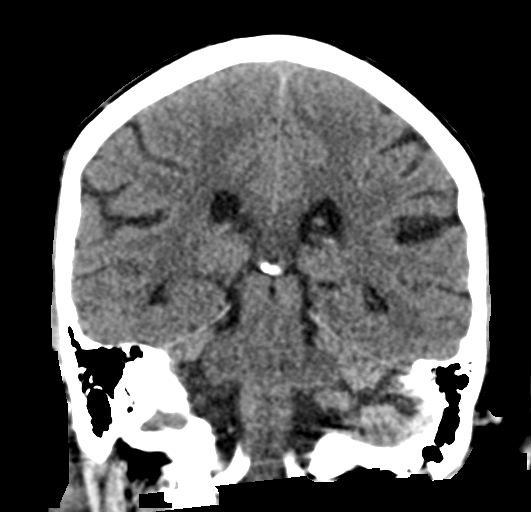
[im 37/67  brain]
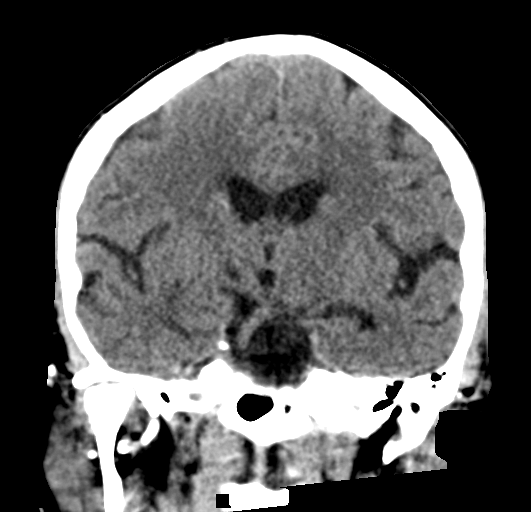

[Series 6: head 3.0 mpr sag · sagittal · 0.31mm/px · 3 of 54 slices shown]
[im 18/54  brain]
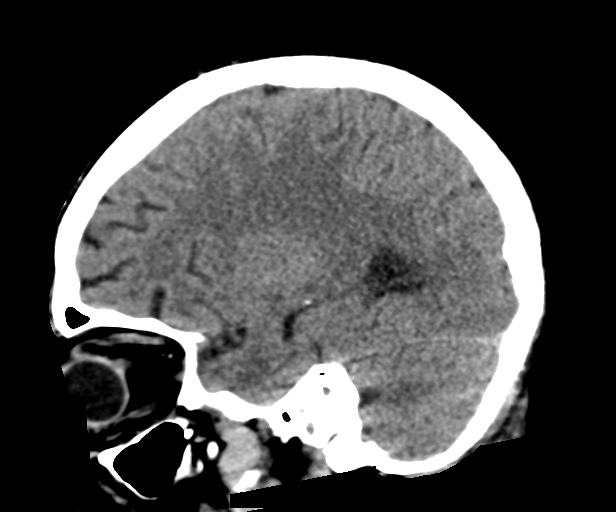
[im 27/54  brain]
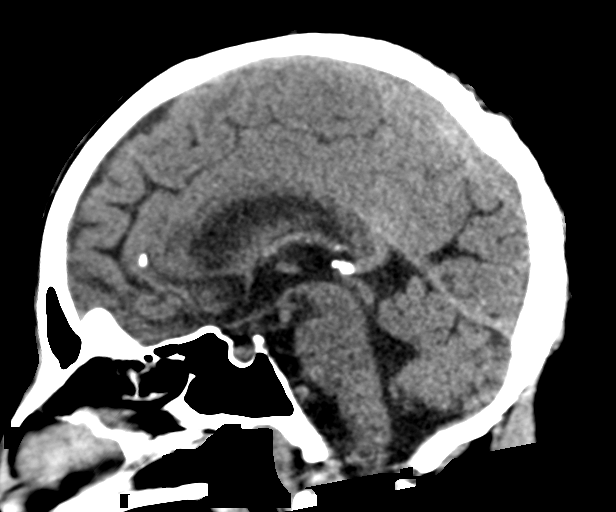
[im 36/54  brain]
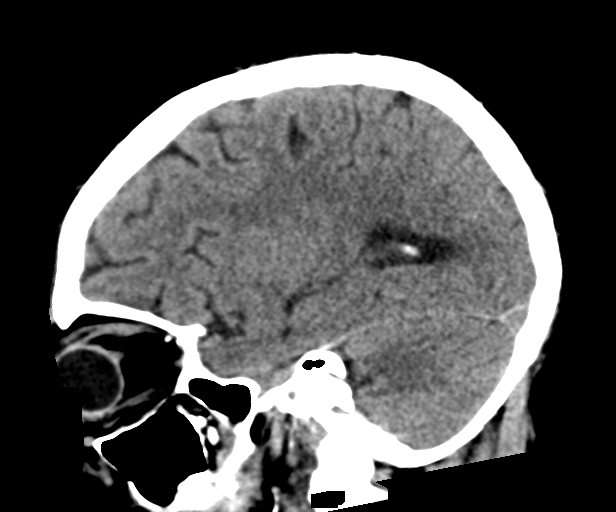

[16 of 47 positions shown; findings below may reference images not displayed]

FINDINGS: Brain: No acute intracranial abnormality. Specifically, no
hemorrhage, hydrocephalus, mass lesion, acute infarction, or
significant intracranial injury.

Vascular: No hyperdense vessel or unexpected calcification.

Skull: No acute calvarial abnormality.

Sinuses/Orbits: No acute findings

Other: None
IMPRESSION: No acute intracranial abnormality.

## 2022-01-10 MED ORDER — LABETALOL HCL 5 MG/ML IV SOLN: 10.0000 mg | INTRAVENOUS | Status: DC | PRN

## 2022-01-10 MED ORDER — OXYCODONE HCL 5 MG PO TABS: 5.0000 mg | ORAL_TABLET | Freq: Four times a day (QID) | ORAL | Status: DC | PRN

## 2022-01-10 MED ORDER — CARVEDILOL 3.125 MG PO TABS
3.1250 mg | ORAL_TABLET | Freq: Two times a day (BID) | ORAL | Status: DC
  Administered 2022-01-10 (×2): 3.125 mg via ORAL
  Filled 2022-01-10 (×2): qty 1

## 2022-01-10 NOTE — Progress Notes (Signed)
? ?NAME:  [name] [name], MRN:  [id number], DOB:  [birth year], LOS: 3 ?ADMISSION DATE:  01/06/2022 CONSULTATION DATE:  01/09/2022 ?REFERRING MD:  Posey Pronto - TRH CHIEF COMPLAINT:  Hypertensive urgency ? ?History of Present Illness:  ?41 year old woman who presented to [facility] ED 5/5 for AMS/somnolence. Patient reportedly missed HD. 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 Of note, patient has had several admissions this year related to medication misuse (11/2021, responded to Narcan; 12/2021 Ambien misuse) as well as an admission for COVID PNA (11/2021) and SDH (12/2021). ? ?On ED arrival, patient was afebrile, tachycardic and hypertensive to SBP 210s. CT Head with NAICA. Labs were notable for K 6.0, BUN 47, Cr elevated (on HD). EtOH level undetectable. Patient's K was shifted in the ED with Lokelma, Ca gluconate, insulin/D50 and IV labetalol was administered for HTN. Nephrology was consulted for HD (5/6). Admitted to [facility].  ? ?Unfortunately post-HD and administration of home medications, patient remained hypertensive with SBP 200s into 5/8. Refused HD 5/8AM as well as medications. Subsequently appeared progressively more confused/combative with refractory hypertension.  ? ?PCCM consulted for ICU transfer for hypertension management. ? ?Pertinent Medical History:  ? ?Past Medical History:  ?Diagnosis Date  ? Anemia   ? Anxiety   ? ESRD (end stage renal disease) ([place])   ? Henmodialysis  ? Gastroparesis   ? Hypertension   ? Narcotic abuse ([place])   ? Non compliance with medical treatment   ? Renal disorder 2015  ? transplant  ? Vision loss   ? ?Significant Hospital Events: ?Including procedures, antibiotic start and stop dates in addition to other pertinent events   ?5/5 - Admitted for AMS, somnolence. Missed HD. Dialyzed with  some improvement in mental status. Hypertensive to 200s. ?5/6 - 5/7 - Remained hypertensive to 190s-200s despite home medications/IV PRNs. ?5/8 - More confused, combative; refused HD and medications. Hypertensive to 200s. PCCM consulted for ICU admission/Cardene gtt. ? ?Interim History / Subjective:  ?She denies complaints this morning. She wants to go home and see her dog. ? ?Objective:  ?Blood pressure 132/64, pulse (!) 108, temperature 99.1 ?F (37.3 ?C), temperature source Oral, resp. rate 17, height '5\' 4"'$  (1.626 m), weight 50.5 kg, SpO2 96 %. ?   ?   ? ?Intake/Output Summary (Last 24 hours) at 01/10/2022 0814 ?Last data filed at 01/10/2022 0700 ?Gross per 24 hour  ?Intake 289.71 ml  ?Output --  ?Net 289.71 ml  ? ? ?Filed Weights  ? 01/06/22 2112 01/10/22 0500  ?Weight: 51 kg 50.5 kg  ? ?Physical Examination: ?General: chronically ill appearing woman lying in bed in NAD ?HEENT: [place]/AT, eyes anicteric ?Neuro: Awake and alert, answering questions appropriately extremities. ?CV: S1-S2, tachycardic, regular rhythm ?PULM: Breathing comfortably on room air, CTAB ?GI: Soft, nontender, nondistended ?Extremities: Minimal lower extremity ?Skin: Warm, dry, no rashes ? ?K+ 4.9 ?BUN 54 ?Cr 10.92 ?WBC 4.4 ?H/H 9.8/32.5 ?Platelets 147 ? ?Resolved Hospital Problem List:  ? ? ?Assessment & Plan:  ?Hypertensive emergency ?Admitted with hypertensive urgency with SBPs 210s refractory to home medications/HD. ?-Remains off nicardipine and transferred back to the floor ?- Adding Coreg BID ?-Continue clonidine patch, hydralazine, losartan ?-Labetalol for SBP greater than 160 ? ?Acute encephalopathy due to hypertensive emergency ?Presented with AMS, confusion, somnolence. Differential includes polypharmacy/medication misuse, HD noncompliance, cyclosporine toxicity, hypertension with  resultant PRES. ?-Appreciate neurology's management. ?-Defer to neurology if MRI brain is still required ?-Haldol PRN ?-cyclosporine level prnding ? ?ESRD on  HD ?S/p failed renal transplant allograft 2018, return to HD 2019 ?Hyperkalemia, resolved ?hyperphosphatemia ?- HD per nephrology ?-Strict I's/O ?-Renally dose meds ?-phos binders ? ?T1DM ?S/p pancreas transplant ?History of T1DM s/p SPKT 2014. Functioning pancreas allograft. ?- con't mycophenolate and prednisone ?- defer to nephro timing of resuming cyclosporine ? ?History of narcotic abuse ?History of narcotic misuse. Recent admission 11/2021 for confusion/AMS, responded to Narcan. Home rx for Percocet. ?- limit opiates where able. Monitor for signs of withdrawal. ? ?Chronic anemia due to renal disease ?-iron supplements ? ?Thrombocytopenia ?-monitor ? ?Stable to transfer back to the floor. TRH to re-assume care tomorrow. ? ?Best Practice: (right click and "Reselect all SmartList Selections" daily)  ? ?Diet/type: Regular consistency (see orders) ?DVT prophylaxis: prophylactic heparin  ?GI prophylaxis: N/A ?Lines: N/A ?Foley:  N/A ?Code Status:  full code ? ? ?Labs:  ?CBC: ?Recent Labs  ?Lab 01/07/22 ?0005 01/07/22 ?1884 01/08/22 ?0041 01/09/22 ?1660 01/10/22 ?0310  ?WBC 4.1 4.2 4.0 4.4 4.4  ?HGB 9.2* 9.3* 9.8* 9.9* 9.8*  ?HCT 29.9* 29.0* 32.3* 31.6* 32.5*  ?MCV 100.0 97.0 98.2 98.1 100.0  ?PLT 135* 138* 148* 144* 147*  ? ? ?Basic Metabolic Panel: ?Recent Labs  ?Lab 01/07/22 ?0006 01/07/22 ?6301 01/08/22 ?0041 01/09/22 ?6010 01/10/22 ?0310  ?NA 138 138 140 139 140  ?K 5.4* 4.9 5.1 5.2* 4.9  ?CL 100 97* 98 100 98  ?CO2 '23 24 26 28 26  '$ ?GLUCOSE 161* 84 107* 99 80  ?BUN 48* 50* 22* 42* 54*  ?CREATININE 9.01* 9.57* 5.51* 8.36* 10.92*  ?CALCIUM 9.7 9.8 9.6 10.2 9.7  ?MG  --   --   --   --  2.8*  ?PHOS 5.3*  --  5.5* 4.8* 5.1*  ? ?  ? ?Julian Hy, DO 01/10/22 8:24 AM ?[facility] ? ?

## 2022-01-10 NOTE — Progress Notes (Signed)
Patient unavailable for EEG.  Going to dialysis. ?

## 2022-01-10 NOTE — Progress Notes (Signed)
removed 205ms net fluid unable to remove more due to hypotension, cramping. Pt signed off 7 minutes early AMA due to headache, cramping.   pre bp 163/78 post bp 131/67 pre weight 52.0kg post weight 50.6kg bed scales.  2 bandages to rua avf ,  gave pt ativan as ordered for anxiety, upon arrival pt asking for dilaudid no order available for this. pt not actually in pain wanted something to go to sleep. ?

## 2022-01-10 NOTE — Procedures (Signed)
Patient Name: [name] [name]  ?MRN: [id number]  ?Epilepsy Attending: Lora Havens  ?Referring Physician/Provider: August Albino, NP ?Date: 01/10/2022 ?Duration: 21.30 mins ? ?Patient history: 41 y.o. female with past medical history of ESRD on HD, s/p renal transplant, HTN, DM, narcotic use, anxiety and noncompliance with medical treatments presented to the ED  on 5/6 with confusion and reporting missed HD days. She underwent HD and notes indicating her mental status was improving. RN at bedside, reports that yesterday patient was alert, oriented and cooperative, however during the night she became very aggressive, paranoid, delusional and confused. EEG to evaluate for seizure ? ?Level of alertness: Awake, asleep ? ?AEDs during EEG study: None ? ?Technical aspects: This EEG study was done with scalp electrodes positioned according to the 10-20 International system of electrode placement. Electrical activity was acquired at a sampling rate of '500Hz'$  and reviewed with a high frequency filter of '70Hz'$  and a low frequency filter of '1Hz'$ . EEG data were recorded continuously and digitally stored.  ? ?Description: The posterior dominant rhythm consists of 9 Hz activity of moderate voltage (25-35 uV) seen predominantly in posterior head regions, symmetric and reactive to eye opening and eye closing. Sleep was characterized by vertex waves, sleep spindles (12 to 14 Hz), maximal frontocentral region. Hyperventilation and photic stimulation were not performed.    ? ?IMPRESSION: ?This study is within normal limits. No seizures or epileptiform discharges were seen throughout the recording. ? ?Lora Havens  ? ?

## 2022-01-10 NOTE — Progress Notes (Signed)
Patient requested to leave against medical advice, informed patient of consequences of leaving. She stated she had a family emergency and needed to leave immediately. Removed the patients IV and patient left. ?

## 2022-01-10 NOTE — Consult Note (Signed)
[facility] Face-to-Face Psychiatry Consult  ? ?Reason for Consult:  acute psychosis ?Referring Physician:  Dr. Posey Pronto ?Patient Identification: [name] [name] ?MRN:  [id number] ?Principal Diagnosis: Acute encephalopathy ?Diagnosis:  Principal Problem: ?  Acute encephalopathy ?Active Problems: ?  Type 2 diabetes mellitus with other diabetic kidney complication ([place]) ?  Immunosuppression ([place]) ?  Hyperkalemia ?  Narcotic dependence ([place]) ?  ESRD on dialysis [facility]) ?  HTN (hypertension), malignant ? ? ?Total Time spent with patient: 30 minutes ? ?Subjective:   ?[name] [name] is a 41 y.o. female patient admitted with altered mental status and somnolent secondary to missed hemodialysis session.  Patient is seen and assessed by the psychiatric nurse practitioner.  Patient does agree to a brief psychiatric evaluation, as she is currently receiving hemodialysis treatment and tired.  Patient states she is unable to recall yesterday's events leading up to the psychiatric consult.  She is able to apologize for her actions, although she cannot recall what took place and or what contributed to her agitation and psychosis.  She currently denies any acute psychiatric symptoms that include psychosis, paranoia, hallucinations, suicidal ideations and or homicidal ideations. ? ?Patient is alert and oriented x4, calm and cooperative, very pleasant although appears to be lethargic.  She is able to identify correctly days of the week going backwards.  She is able to hold a linear conversation, appears lucid and answers all questions appropriately.  She denies any imminent harm to herself and others.  She further denies any physical symptoms, with the exception of lethargic which she contributes to ongoing dialysis treatment.  Patient states she lives alone in [place] no children, identifies her support system as close friends and relatives, and she has a Astronomer. ? ?HPI:  41 year old woman who presented to [facility] ED 5/5 for  AMS/somnolence. Patient reportedly missed HD. 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 Of note, patient has had several admissions this year related to medication misuse (11/2021, responded to Narcan; 12/2021 Ambien misuse) as well as an admission for COVID PNA (11/2021) and SDH (12/2021). ?  ? ?Past Psychiatric History: Anxiety, narcotic abuse.  Patient currently denies any medication management with psychotropic meds.  She denies any current outpatient psychiatric services.  She denies any recent and or past history of inpatient hospitalization.  She denies any history of suicide attempt, suicidal thoughts, and or nonsuicidal self-injurious behavior.  She denies any recent and or concurrent use of illicit substances.  She denies any current and/or pending legal charges. ? ?Risk to Self: Denies ?Risk to Others: Denies ?Prior Inpatient Therapy: Denies ?Prior Outpatient Therapy: Denies ? ?Past Medical History:  ?Past Medical History:  ?Diagnosis Date  ? Anemia   ? Anxiety   ? ESRD (end stage renal disease) ([place])   ? Henmodialysis  ? Gastroparesis   ? Hypertension   ? Narcotic abuse ([place])   ? Non compliance with medical treatment   ? Renal disorder 2015  ? transplant  ? Vision loss   ?  ?Past Surgical History:  ?Procedure Laterality Date  ? A/V FISTULAGRAM Right 02/17/2021  ? Procedure: A/V FISTULAGRAM;  Surgeon: [name], MD;  Location: [facility];  Service: Cardiovascular;  Laterality: Right;  ? AV FISTULA PLACEMENT  11/17/2011  ? Procedure: ARTERIOVENOUS (AV) FISTULA CREATION;  Surgeon: Arvilla Meres  Early, MD;  Location: [facility];  Service: Vascular;  Laterality: Right;  ? BIOPSY  12/03/2021  ? Procedure: BIOPSY;  Surgeon: Doran Stabler, MD;  Location: [place];  Service: Gastroenterology;;  ?  ESOPHAGOGASTRODUODENOSCOPY (EGD) WITH PROPOFOL N/A 12/03/2021  ? Procedure: ESOPHAGOGASTRODUODENOSCOPY (EGD) WITH PROPOFOL;  Surgeon: Doran Stabler, MD;  Location: [place];  Service: Gastroenterology;  Laterality: N/A;  ? EYE SURGERY    ? HEMATOMA EVACUATION Right 02/25/2021  ? Procedure: EVACUATION HEMATOMA RIGHT CHEST;  Surgeon: Waynetta Sandy, MD;  Location: [place];  Service: Vascular;  Laterality: Right;  ? INSERTION OF DIALYSIS CATHETER Right 12/08/2020  ? Procedure: INSERTION OF TUNNELED DIALYSIS CATHETER;  Surgeon: [name], MD;  Location: [facility] OR;  Service: Vascular;  Laterality: Right;  ? IR REMOVAL TUN CV CATH W/O FL  05/03/2021  ? KIDNEY TRANSPLANT    ? NEPHRECTOMY TRANSPLANTED ORGAN    ? pancrease transplant    ? PERIPHERAL VASCULAR BALLOON ANGIOPLASTY Right 02/17/2021  ? Procedure: PERIPHERAL VASCULAR BALLOON ANGIOPLASTY;  Surgeon: [name], MD;  Location: [place] [facility];  Service: Cardiovascular;  Laterality: Right;  ? REFRACTIVE SURGERY    ? REVISON OF ARTERIOVENOUS FISTULA Right 12/08/2020  ? Procedure: RIGHT ARM ARTERIOVENOUS FISTULA REVISION AND RESECTION;  Surgeon: [name], MD;  Location: [place];  Service: Vascular;  Laterality: Right;  ? REVISON OF ARTERIOVENOUS FISTULA Right 02/25/2021  ? Procedure: RIGHT ARM ARTERIOVENOUS FISTULA REVISION WITH TRANSPOSITION OF CEPHALIC VEIN ON AXILLARY VEIN;  Surgeon: [name], MD;  Location: [place];  Service: Vascular;  Laterality: Right;  ? ?Family History:  ?Family History  ?Problem Relation Age of Onset  ? Hypertension Father   ? ?Family Psychiatric  History: Denies ?Social History:  ?Social History  ? ?Substance and Sexual Activity  ?Alcohol Use No  ?   ?Social History  ? ?Substance and Sexual Activity  ?Drug Use No  ?  ?Social History  ? ?Socioeconomic History  ? Marital status: Married  ?  Spouse name: Not on file  ? Number of children: Not on file  ? Years of education: Not on file  ? Highest  education level: Not on file  ?Occupational History  ? Not on file  ?Tobacco Use  ? Smoking status: Former  ?  Types: Pipe  ? Smokeless tobacco: Never  ? Tobacco comments:  ?  Hooka  ?Vaping Use  ? Vaping Use: Never used  ?Substance and Sexual Activity  ? Alcohol use: No  ? Drug use: No  ? Sexual activity: Not Currently  ?Other Topics Concern  ? Not on file  ?Social History Narrative  ? Worked for [employer]--as did husband. Had to leave [place] for safety reasons. Has been in [place] since 9/09.  ? ?Social Determinants of Health  ? ?Financial Resource Strain: Not on file  ?Food Insecurity: Not on file  ?Transportation Needs: Not on file  ?Physical Activity: Not on file  ?Stress: Not on file  ?Social Connections: Not on file  ? ?Additional Social History: ?  ? ?Allergies:   ?Allergies  ?Allergen Reactions  ? Iodinated Contrast Media   ?  Other reaction(s): Unknown  ? ? ?Labs:  ?Results for orders placed or performed during the hospital encounter of 01/06/22 (from the past 48 hour(s))  ?CBC     Status: Abnormal  ? Collection Time: 01/09/22 12:49 AM  ?Result Value Ref Range  ? WBC 4.4 4.0 - 10.5 K/uL  ?  RBC 3.22 (L) 3.87 - 5.11 MIL/uL  ? Hemoglobin 9.9 (L) 12.0 - 15.0 g/dL  ? HCT 31.6 (L) 36.0 - 46.0 %  ? MCV 98.1 80.0 - 100.0 fL  ? MCH 30.7 26.0 - 34.0 pg  ? MCHC 31.3 30.0 - 36.0 g/dL  ? RDW 14.8 11.5 - 15.5 %  ? Platelets 144 (L) 150 - 400 K/uL  ? nRBC 0.0 0.0 - 0.2 %  ?  Comment: Performed at [facility], [place] [address] [place], [place] [address]  ?Renal function panel     Status: Abnormal  ? Collection Time: 01/09/22 12:49 AM  ?Result Value Ref Range  ? Sodium 139 135 - 145 mmol/L  ? Potassium 5.2 (H) 3.5 - 5.1 mmol/L  ? Chloride 100 98 - 111 mmol/L  ? CO2 28 22 - 32 mmol/L  ? Glucose, Bld 99 70 - 99 mg/dL  ?  Comment: Glucose reference range applies only to samples taken after fasting for at least 8 hours.  ? BUN 42 (H) 6 - 20 mg/dL  ? Creatinine, Ser 8.36 (H) 0.44 - 1.00 mg/dL  ?  Comment: DELTA CHECK NOTED  ?  Calcium 10.2 8.9 - 10.3 mg/dL  ? Phosphorus 4.8 (H) 2.5 - 4.6 mg/dL  ? Albumin 3.6 3.5 - 5.0 g/dL  ? GFR, Estimated 6 (L) >60 mL/min  ?  Comment: (NOTE) ?Calculated using the CKD-EPI Creatinine Equation (2021) ?  ? An

## 2022-01-10 NOTE — Progress Notes (Signed)
EEG complete - results pending 

## 2022-01-10 NOTE — Plan of Care (Signed)

## 2022-01-10 NOTE — Progress Notes (Signed)
?[facility] ?Progress Note  ? ?Subjective:    ?Ended up going to ICU yesterday for nicardipine gtt -> ultimately took po meds and BP good this AM>  She was transferred back to floor status.  Seen during HD.  No complaints only wants to go home.     ? ?Objective ?Vitals:  ? 01/10/22 1130 01/10/22 1200 01/10/22 1230 01/10/22 1300  ?BP: (!) 163/94 (!) 156/81 127/69 99/60  ?Pulse: (!) 102 (!) 104 (!) 105 (!) 106  ?Resp:  (!) 21 (!) 22 19  ?Temp:      ?TempSrc:      ?SpO2:      ?Weight:      ?Height:      ? ?Physical Exam ?General: easily arousable to avoid ?Heart: S1 and S2; No murmurs, gallops, or rubs ?Lungs: Clear throughout ?Abdomen: Soft and non-tender ?Extremities: No edema BLLE ?Dialysis Access: RUE AVF (+) B/T  ?Neuro: AOx3 but seems hazy on details; physically appears nonfocal ? ?Filed Weights  ? 01/10/22 0500 01/10/22 0945 01/10/22 1058  ?Weight: 50.5 kg 51.6 kg 52 kg  ? ? ?Intake/Output Summary (Last 24 hours) at 01/10/2022 1318 ?Last data filed at 01/10/2022 0700 ?Gross per 24 hour  ?Intake 289.71 ml  ?Output --  ?Net 289.71 ml  ? ? ? ?Additional Objective ?Labs: ?Basic Metabolic Panel: ?Recent Labs  ?Lab 01/08/22 ?0041 01/09/22 ?6712 01/10/22 ?0310  ?NA 140 139 140  ?K 5.1 5.2* 4.9  ?CL 98 100 98  ?CO2 '26 28 26  '$ ?GLUCOSE 107* 99 80  ?BUN 22* 42* 54*  ?CREATININE 5.51* 8.36* 10.92*  ?CALCIUM 9.6 10.2 9.7  ?PHOS 5.5* 4.8* 5.1*  ? ? ?Liver Function Tests: ?Recent Labs  ?Lab 01/06/22 ?2127 01/07/22 ?0006 01/08/22 ?0041 01/09/22 ?4580 01/10/22 ?0310  ?AST 11*  --   --   --   --   ?ALT 11  --   --   --   --   ?ALKPHOS 68  --   --   --   --   ?BILITOT 0.8  --   --   --   --   ?PROT 7.1  --   --   --   --   ?ALBUMIN 3.5   < > 3.2* 3.6 3.4*  ? < > = values in this interval not displayed.  ? ? ?No results for input(s): LIPASE, AMYLASE in the last 168 hours. ?CBC: ?Recent Labs  ?Lab 01/07/22 ?0005 01/07/22 ?9983 01/08/22 ?0041 01/09/22 ?3825 01/10/22 ?0310  ?WBC 4.1 4.2 4.0 4.4 4.4  ?HGB 9.2* 9.3* 9.8*  9.9* 9.8*  ?HCT 29.9* 29.0* 32.3* 31.6* 32.5*  ?MCV 100.0 97.0 98.2 98.1 100.0  ?PLT 135* 138* 148* 144* 147*  ? ? ?Blood Culture ?   ?Component Value Date/Time  ? SDES BLOOD BLOOD LEFT ARM 12/23/2021 0950  ? SPECREQUEST  12/23/2021 0950  ?  BOTTLES DRAWN AEROBIC AND ANAEROBIC Blood Culture adequate volume  ? CULT  12/23/2021 0950  ?  NO GROWTH 5 DAYS ?Performed at [facility], [place] [address] [place], [place] [address] ?  ? REPTSTATUS 12/28/2021 FINAL 12/23/2021 0950  ? ? ?Cardiac Enzymes: ?No results for input(s): CKTOTAL, CKMB, CKMBINDEX, TROPONINI in the last 168 hours. ?CBG: ?Recent Labs  ?Lab 01/07/22 ?2024 01/09/22 ?1940 01/09/22 ?2338 01/10/22 ?0327 01/10/22 ?6734  ?GLUCAP 100* 102* 89 84 77  ? ? ?Iron Studies: No results for input(s): IRON, TIBC, TRANSFERRIN, FERRITIN in the last 72 hours. ?Lab Results  ?Component  Value Date  ? INR 1.3 (H) 12/01/2021  ? INR 1.2 12/20/2018  ? INR 1.25 11/16/2011  ? ?Studies/Results: ?CT HEAD WO CONTRAST (5MM) ? ?Result Date: 01/10/2022 ?CLINICAL DATA:  Mental status changes.  Hypertensive emergency. EXAM: CT HEAD WITHOUT CONTRAST TECHNIQUE: Contiguous axial images were obtained from the base of the skull through the vertex without intravenous contrast. RADIATION DOSE REDUCTION: This exam was performed according to the departmental dose-optimization program which includes automated exposure control, adjustment of the mA and/or kV according to patient size and/or use of iterative reconstruction technique. COMPARISON:  01/07/2022 FINDINGS: Brain: No acute intracranial abnormality. Specifically, no hemorrhage, hydrocephalus, mass lesion, acute infarction, or significant intracranial injury. Vascular: No hyperdense vessel or unexpected calcification. Skull: No acute calvarial abnormality. Sinuses/Orbits: No acute findings Other: None IMPRESSION: No acute intracranial abnormality. Electronically Signed   By: Rolm Baptise M.D.   On: 01/10/2022 02:20   ? ?Medications: ? sodium  chloride    ? sodium chloride    ? ? aspirin  81 mg Oral Daily  ? calcium acetate  2,001 mg Oral TID WC  ? carvedilol  3.125 mg Oral BID WC  ? Chlorhexidine Gluconate Cloth  6 each Topical Q0600  ? cloNIDine  0.2 mg Transdermal Q Sun  ? ferric citrate  630 mg Oral TID WC  ? heparin  5,000 Units Subcutaneous Q8H  ? hydrALAZINE  100 mg Oral Q8H  ? losartan  100 mg Oral Daily  ? mouth rinse  15 mL Mouth Rinse BID  ? mycophenolate  540 mg Oral BID  ? nitroGLYCERIN  1 inch Topical Q6H  ? predniSONE  15 mg Oral Q breakfast  ? sodium chloride flush  3 mL Intravenous Q12H  ? ? ?Dialysis Orders: ?[facility] MWF  3h 40mn  400/500  53.5kg  2/2 bath  RUA AVF Hep none ? - last HD 5/03, 56.2 > 54.3kg ? - last Hb 8.4 on 5/03 ? - mircera 225 q2, last 5/03, due 5/17 ? - hectorol 8 ug tiw IV ? - sensipar '90mg'$  tiw po ? ?Assessment/Plan: ?Acute encephalopathy - w/u in progress. This has been a recurrent issue. NH3 is normal here and pt's HD compliance is good so not uremic. Doesn't appear septic. Cyclosporine neurotoxicity could be a possibility. Trough CyA level is pending - collected 5/7 around midnight.  Seems mental status improved with BP control.  MRI pending.   ?ESRD - gets HD on MWF. Had HD here 5/6 off schedule, refused HD yesterday, HD today off schedule.  Will resume usual schedule tomorrow.  ?BP/ vol - no sig vol excess, UF 2-3 L w/ HD as tolerated ?HTN - BP improved on po meds - required IV management yesterday due to refusal of meds with AMS ?Anemia ckd - Hb 9.8, next esa due on 5/17.  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 Continue binders.  ? ?LJannifer HickMD ?[facility] ?Pager [phone]? ? ? ?

## 2022-01-10 NOTE — Progress Notes (Signed)
Pt left AMA due to family emergency. IV removed by dayshift RN. Pt didn't have time to sign AMA form. Pt was educated on the risks of leaving AMA. She stated that she understood. Both attending and hospitalist services are aware that pt left.   ?

## 2022-01-10 NOTE — Progress Notes (Signed)
Neurology Progress Note ? ? ?S:// ?Patient laying in bed in NAD. She is awake, alert, communicative and calm. She was transferred to the ICU for BP management requiring a Cardene drip. Drip has been off since early this morning. She is still confused, but able to tell me her name, place and month. Denies headache or vision changes. Spoke to her about potentially getting an MRI and she stated she does not like MRIs and will not get it.  ? ? ?O:// ?Current vital signs: ?BP (!) 142/74   Pulse (!) 109   Temp 99.1 ?F (37.3 ?C) (Oral)   Resp 17   Ht '5\' 4"'$  (1.626 m)   Wt 50.5 kg   SpO2 96%   BMI 19.11 kg/m?  ?Vital signs in last 24 hours: ?Temp:  [98.2 ?F (36.8 ?C)-99.1 ?F (37.3 ?C)] 99.1 ?F (37.3 ?C) (05/09 0730) ?Pulse Rate:  [97-117] 109 (05/09 0834) ?Resp:  [6-27] 17 (05/09 0700) ?BP: (113-209)/(62-104) 142/74 (05/09 0834) ?SpO2:  [93 %-100 %] 96 % (05/09 0700) ?Weight:  [50.5 kg] 50.5 kg (05/09 0500) ? ?GENERAL: Awake, alert in NAD ?HEENT: - Normocephalic and atraumatic, dry mm ?LUNGS - Clear to auscultation bilaterally with no wheezes ?CV - S1S2 RRR, no m/r/g, equal pulses bilaterally. ?ABDOMEN - Soft, nontender, nondistended with normoactive BS ?Ext: warm, well perfused, intact peripheral pulses, no edema ? ?NEURO:  ?Mental Status: AA&Ox2. Confused. Able to state her name, place and city. Able to name name objects. ?Language: speech is clear.  Naming, repetition, fluency, and comprehension intact. ?Cranial Nerves: PERRL 2 mm/brisk. EOMI, visual fields full, no facial asymmetry, facial sensation intact, hearing intact, tongue/uvula/soft palate midline, normal sternocleidomastoid and trapezius muscle strength. No evidence of tongue atrophy or fibrillations ?Motor: 5/5 in all 4 extremities ?Tone: is normal and bulk is normal ?Sensation- Intact to light touch bilaterally ?Coordination: FTN intact bilaterally, no ataxia in BLE. ?Gait- deferred ? ?Medications ? ?Current Facility-Administered Medications:  ?  0.9 %   sodium chloride infusion, 100 mL, Intravenous, PRN, Adelfa Koh, NP ?  0.9 %  sodium chloride infusion, 100 mL, Intravenous, PRN, Adelfa Koh, NP ?  acetaminophen (TYLENOL) tablet 650 mg, 650 mg, Oral, Q6H PRN **OR** acetaminophen (TYLENOL) suppository 650 mg, 650 mg, Rectal, Q6H PRN, Opyd, Ilene Qua, MD ?  alteplase (CATHFLO ACTIVASE) injection 2 mg, 2 mg, Intracatheter, Once PRN, Opyd, Ilene Qua, MD ?  aspirin chewable tablet 81 mg, 81 mg, Oral, Daily, Opyd, Ilene Qua, MD, 81 mg at 01/10/22 0836 ?  calcium acetate (PHOSLO) capsule 2,001 mg, 2,001 mg, Oral, TID WC, Opyd, Ilene Qua, MD, 2,001 mg at 01/10/22 0753 ?  carvedilol (COREG) tablet 3.125 mg, 3.125 mg, Oral, BID WC, Julian Hy, DO, 3.125 mg at 01/10/22 7106 ?  Chlorhexidine Gluconate Cloth 2 % PADS 6 each, 6 each, Topical, Q0600, Opyd, Ilene Qua, MD, 6 each at 01/09/22 0556 ?  cloNIDine (CATAPRES - Dosed in mg/24 hr) patch 0.2 mg, 0.2 mg, Transdermal, Q Sun, Patel, Pranav M, MD, 0.2 mg at 01/09/22 1030 ?  docusate sodium (COLACE) capsule 100 mg, 100 mg, Oral, BID PRN, [place] Crane M, PA-C ?  ferric citrate (AURYXIA) tablet 630 mg, 630 mg, Oral, TID WC, Opyd, Ilene Qua, MD, 630 mg at 01/10/22 0753 ?  haloperidol lactate (HALDOL) injection 2 mg, 2 mg, Intravenous, BID PRN, Suella Broad, FNP, 2 mg at 01/09/22 1714 ?  heparin injection 1,000 Units, 1,000 Units, Dialysis, PRN, Opyd, Ilene Qua, MD ?  heparin  injection 5,000 Units, 5,000 Units, Subcutaneous, Q8H, Reese, Stephanie M, PA-C ?  hydrALAZINE (APRESOLINE) tablet 100 mg, 100 mg, Oral, Q8H, Lavina Hamman, MD, 100 mg at 01/10/22 3545 ?  labetalol (NORMODYNE) injection 10 mg, 10 mg, Intravenous, Q2H PRN, Noemi Chapel P, DO ?  lidocaine (PF) (XYLOCAINE) 1 % injection 5 mL, 5 mL, Intradermal, PRN, Opyd, Ilene Qua, MD ?  lidocaine-prilocaine (EMLA) cream 1 application., 1 application., Topical, PRN, Opyd, Ilene Qua, MD ?  LORazepam (ATIVAN) injection 0.5 mg, 0.5 mg,  Intravenous, Q4H PRN, Lavina Hamman, MD, 0.5 mg at 01/09/22 2324 ?  losartan (COZAAR) tablet 100 mg, 100 mg, Oral, Daily, Opyd, Ilene Qua, MD, 100 mg at 01/10/22 0835 ?  MEDLINE mouth rinse, 15 mL, Mouth Rinse, BID, Julian Hy, DO, 15 mL at 01/09/22 2149 ?  mycophenolate (MYFORTIC) EC tablet 540 mg, 540 mg, Oral, BID, Opyd, Ilene Qua, MD, 540 mg at 01/09/22 2146 ?  nitroGLYCERIN (NITROGLYN) 2 % ointment 1 inch, 1 inch, Topical, Q6H, Lavina Hamman, MD, 1 inch at 01/09/22 2340 ?  ondansetron (ZOFRAN) tablet 4 mg, 4 mg, Oral, Q6H PRN **OR** ondansetron (ZOFRAN) injection 4 mg, 4 mg, Intravenous, Q6H PRN, Opyd, Ilene Qua, MD, 4 mg at 01/07/22 6256 ?  pentafluoroprop-tetrafluoroeth (GEBAUERS) aerosol 1 application., 1 application., Topical, PRN, Opyd, Ilene Qua, MD ?  polyethylene glycol (MIRALAX / GLYCOLAX) packet 17 g, 17 g, Oral, Daily PRN, [place] Crane M, PA-C ?  predniSONE (DELTASONE) tablet 15 mg, 15 mg, Oral, Q breakfast, Opyd, Ilene Qua, MD, 15 mg at 01/10/22 3893 ?  sodium chloride flush (NS) 0.9 % injection 3 mL, 3 mL, Intravenous, Q12H, Opyd, Ilene Qua, MD, 3 mL at 01/09/22 2154 ? ?Labs ?I have reviewed labs in epic and the results pertinent to this consultation are: ?K 4.9 ?BUN 54 (42) ?Cr 10.92 (8.36) ?Hgb 9.8 ?Ammonia 19 ?Cyclosporine level pending  ? ? ?Imaging ?I have reviewed images in epic and the results pertinent to this consultation are: ? ?CT-scan of the brain 5/8: ?No acute process  ? ?Assessment:  ?[name] [name] is a 41 y.o. female with past medical history of ESRD on HD, s/p renal transplant, HTN, DM, narcotic use, anxiety and noncompliance with medical treatments presented to the ED  on 5/6 with confusion and reporting missed HD days. She underwent HD and notes indicating her mental status was improving. RN at bedside, reports that yesterday patient was alert, oriented and cooperative, however during the night she became very aggressive, paranoid, delusional and confused. She is  refusing HD and all Po meds. Recently received ativan. She needed to be restrained in bilateral wrists. She also has been very hypertensive since admission with ranges 203-230/93-107.  ? ?Acute Encephalopathy, likely multifactorial due to persistently hypertensive in the 734'K,  and metabolic derangements vs PRES vs seizures vs cyclosporine toxicity ? ?Recommendations: ?- Keep normotensive  ?- Correct electrolyte and metabolic disturbances per primary  ?- MRI Brain if able  ?- Check EEG and consider LTM  ?- Neurology will continue to follow  ? ?Beulah Gandy DNP, ACNPC-AG  ? ?  ?

## 2022-01-11 LAB — CYCLOSPORINE: Cyclosporine, LabCorp: <10 ng/mL — ABNORMAL LOW (ref 100–400)

## 2022-01-13 NOTE — Discharge Summary (Signed)
Physician Discharge Summary  ?Patient ID: ?[name] [name] ?MRN: [id number] ?DOB/AGE: 09-09-1980 41 y.o. ? ?Admit date: 01/06/2022 ?Discharge date: 01/13/2022 ? ?Admission Diagnoses: ?Acute encephalopathy ? ?Discharge Diagnoses:  ?Principal Problem: ?  Acute encephalopathy ?Active Problems: ?  Type 2 diabetes mellitus with other diabetic kidney complication ([place]) ?  Immunosuppression ([place]) ?  Hyperkalemia ?  Narcotic dependence ([place]) ?  ESRD on dialysis [facility]) ?  HTN (hypertension), malignant ?Hypertensive emergency ?ESRD on HD ?Failed renal transplant ?Hyperkalemia ?hyperphosphatemia ?Type 1 DM ?History of pancreatic transplant ?History of narcotic abuse ?Chronic anemia ?thrombocytopenia ? ?Discharged Condition: fair ? ?Hospital Course:  ?[name] [name] is a 41 year old woman who presented to [facility] ED 5/5 for AMS/somnolence. Patient reportedly missed HD. PMHx significant for HTN, P5VZ (complicated by gastroparesis and ESRD), status post SPK transplant (2014 at [facility]) with subsequent renal allograft failure 2/2 noncompliance requiring return to HD 04/2018 (MWF via RUE AVF) and history of narcotic abuse. Of note, patient has had several admissions this year related to medication misuse (11/2021, responded to Narcan; 12/2021 Ambien misuse) as well as an admission for COVID PNA (11/2021) and SDH (12/2021). ?  ?On ED arrival, patient was afebrile, tachycardic and hypertensive to SBP 210s. CT Head with NAICA. Labs were notable for K 6.0, BUN 47, Cr elevated (on HD). EtOH level undetectable. Patient's K was shifted in the ED with Lokelma, Ca gluconate, insulin/D50 and IV labetalol was administered for HTN. Nephrology was consulted for HD (5/6). Admitted to [facility].  ?  ?Unfortunately post-HD and administration of home medications, patient remained hypertensive with SBP 200s into 5/8. Refused HD 5/8AM as well as medications. Subsequently appeared progressively more confused/combative with refractory hypertension.  ?  ?PCCM consulted for ICU  transfer for hypertension management. ? ?5/5 - Admitted for AMS, somnolence. Missed HD. Dialyzed with some improvement in mental status. Hypertensive to 200s. ?5/6 - 5/7 - Remained hypertensive to 190s-200s despite home medications/IV PRNs. ?5/8 - More confused, combative; refused HD and medications. Hypertensive to 200s. PCCM consulted for ICU admission/Cardene gtt. ?5/9 left AMA after transfer back to the floor. Primary attending not notified of this. ? ?Consults: pulmonary/intensive care, nephrology, neurology, and psychiatry ? ?Significant Diagnostic Studies:  ? ?EEG: normal exam, no seizures ?CT head: no acute intracranial abnormalities ? ?  Latest Ref Rng & Units 01/10/2022  ?  3:10 AM 01/09/2022  ? 12:49 AM 01/08/2022  ? 12:41 AM  ?BMP  ?Glucose 70 - 99 mg/dL 80   99   107    ?BUN 6 - 20 mg/dL 54   42   22    ?Creatinine 0.44 - 1.00 mg/dL 10.92   8.36   5.51    ?Sodium 135 - 145 mmol/L 140   139   140    ?Potassium 3.5 - 5.1 mmol/L 4.9   5.2   5.1    ?Chloride 98 - 111 mmol/L 98   100   98    ?CO2 22 - 32 mmol/L _0 ?Calcium 8.9 - 10.3 mg/dL 9.7   10.2   9.6    ? ? ?  Latest Ref Rng & Units 01/10/2022  ?  3:10 AM 01/09/2022  ? 12:49 AM 01/08/2022  ? 12:41 AM  ?CBC  ?WBC 4.0 - 10.5 K/uL 4.4   4.4   4.0    ?Hemoglobin 12.0 - 15.0 g/dL 9.8   9.9   9.8    ?Hematocrit 36.0 - 46.0 % 32.5  31.6   32.3    ?Platelets 150 - 400 K/uL 147   144   148    ? ? ? ? ?Treatments: antihypertensives-- both IV & oral, cyclosporine held but steroids and mycophenolate continued for immunosuppression. After BP was better controlled, her encephalopathy resolved. ? ? ?Disposition: left AMA ? ? ? ?Allergies as of 01/10/2022   ? ?   Reactions  ? Iodinated Contrast Media   ? Other reaction(s): Unknown  ? ?  ? ?  ?Medication List  ?  ? ?ASK your doctor about these medications   ? ?amLODipine 10 MG tablet ?Commonly known as: NORVASC ?Take 1 tablet (10 mg total) by mouth daily. ?  ?aspirin 81 MG chewable tablet ?Chew 81 mg by mouth daily. ?   ?Auryxia 1 GM 210 MG(Fe) tablet ?Generic drug: ferric citrate ?Take 630 mg by mouth 3 (three) times daily. ?  ?calcium acetate (Phos [place]) 667 MG/5ML Soln ?Commonly known as: PHOSLYRA ?Take 2,668 mg by mouth 3 (three) times daily with meals. Take 3 tablets with meals and 1 tablet with snacks ?  ?Catapres-TTS-1 0.1 mg/24hr patch ?Generic drug: cloNIDine ?Place 1 patch (0.1 mg total) onto the skin once a week. ?  ?colchicine 0.6 MG tablet ?Take 0.6 mg by mouth as needed. ?  ?cycloSPORINE modified 100 MG capsule ?Commonly known as: NEORAL ?Take 2 capsules (200 mg total) by mouth 2 (two) times daily. ?  ?Darbepoetin Alfa 60 MCG/0.3ML Sosy injection ?Commonly known as: ARANESP ?Inject 0.3 mLs (60 mcg total) into the vein every Friday with hemodialysis. ?  ?diphenhydrAMINE 25 MG tablet ?Commonly known as: BENADRYL ?Take 25 mg by mouth every 6 (six) hours as needed for itching. ?  ?doxercalciferol 4 MCG/2ML injection ?Commonly known as: HECTOROL ?Inject 3 mLs (6 mcg total) into the vein every Monday, Wednesday, and Friday with hemodialysis. ?  ?ergocalciferol 1.25 MG (50000 UT) capsule ?Commonly known as: VITAMIN D2 ?Take 50,000 Units by mouth once a week. ?  ?fluticasone 50 MCG/ACT nasal spray ?Commonly known as: FLONASE ?Place 1 spray into both nostrils daily as needed for allergies. ?  ?furosemide 20 MG tablet ?Commonly known as: LASIX ?Take 20 mg by mouth daily. Take on non-HD days ?  ?lactulose 10 GM/15ML solution ?Commonly known as: [place] ?Take 45 mLs by mouth daily. ?  ?lidocaine-prilocaine cream ?Commonly known as: EMLA ?Apply 1 application topically daily as needed (port access). ?  ?Lokelma 10 g Pack packet ?Generic drug: sodium zirconium cyclosilicate ?Take 10 g by mouth as directed. Take on Non-Dialysis (Sun,Tues, Thurs, Sat) ?  ?losartan 100 MG tablet ?Commonly known as: Cozaar ?Take 1 tablet (100 mg total) by mouth daily. ?  ?mycophenolate 180 MG EC tablet ?Commonly known as: MYFORTIC ?Take 540 mg by  mouth 2 (two) times daily. ?  ?naloxone 4 MG/0.1ML Liqd nasal spray kit ?Commonly known as: NARCAN ?Place 1 spray into the nose as needed (opioid overdose). ?  ?ondansetron 4 MG tablet ?Commonly known as: ZOFRAN ?Take 4 mg by mouth daily as needed for nausea. ?  ?oxyCODONE-acetaminophen 5-325 MG tablet ?Commonly known as: PERCOCET/ROXICET ?Take 1-2 tablets by mouth every 6 (six) hours as needed for severe pain. ?  ?predniSONE 5 MG tablet ?Commonly known as: DELTASONE ?Take 15 mg by mouth daily. ?  ? ?  ? ? ? ?Signed: ?Julian Hy ?01/13/2022, 4:32 PM ? ? ?

## 2022-01-14 ENCOUNTER — Encounter (HOSPITAL_COMMUNITY): Payer: Self-pay

## 2022-01-15 ENCOUNTER — Encounter (HOSPITAL_COMMUNITY): Payer: Self-pay

## 2022-01-30 ENCOUNTER — Emergency Department (HOSPITAL_COMMUNITY): Payer: Medicare Other

## 2022-01-30 ENCOUNTER — Encounter (HOSPITAL_COMMUNITY): Payer: Self-pay | Admitting: Emergency Medicine

## 2022-01-30 ENCOUNTER — Observation Stay (HOSPITAL_COMMUNITY): Payer: Medicare Other

## 2022-01-30 ENCOUNTER — Inpatient Hospital Stay (HOSPITAL_COMMUNITY)
Admission: EM | Admit: 2022-01-30 | Discharge: 2022-02-05 | DRG: 304 | Disposition: A | Discharge status: Home / Self Care | Payer: Medicare Other | Attending: Internal Medicine | Admitting: Internal Medicine

## 2022-01-30 DIAGNOSIS — K219 Gastro-esophageal reflux disease without esophagitis: Secondary | ICD-10-CM | POA: Diagnosis not present

## 2022-01-30 DIAGNOSIS — E1043 Type 1 diabetes mellitus with diabetic autonomic (poly)neuropathy: Secondary | ICD-10-CM | POA: Diagnosis present

## 2022-01-30 DIAGNOSIS — Z9483 Pancreas transplant status: Secondary | ICD-10-CM

## 2022-01-30 DIAGNOSIS — I169 Hypertensive crisis, unspecified: Secondary | ICD-10-CM | POA: Diagnosis present

## 2022-01-30 DIAGNOSIS — T8612 Kidney transplant failure: Secondary | ICD-10-CM | POA: Diagnosis present

## 2022-01-30 DIAGNOSIS — I161 Hypertensive emergency: Principal | ICD-10-CM | POA: Diagnosis present

## 2022-01-30 DIAGNOSIS — I160 Hypertensive urgency: Secondary | ICD-10-CM

## 2022-01-30 DIAGNOSIS — Z91148 Patient's other noncompliance with medication regimen for other reason: Secondary | ICD-10-CM

## 2022-01-30 DIAGNOSIS — F419 Anxiety disorder, unspecified: Secondary | ICD-10-CM | POA: Diagnosis not present

## 2022-01-30 DIAGNOSIS — E8770 Fluid overload, unspecified: Secondary | ICD-10-CM | POA: Diagnosis not present

## 2022-01-30 DIAGNOSIS — Z992 Dependence on renal dialysis: Secondary | ICD-10-CM

## 2022-01-30 DIAGNOSIS — R079 Chest pain, unspecified: Secondary | ICD-10-CM

## 2022-01-30 DIAGNOSIS — Z79620 Long term (current) use of immunosuppressive biologic: Secondary | ICD-10-CM

## 2022-01-30 DIAGNOSIS — I120 Hypertensive chronic kidney disease with stage 5 chronic kidney disease or end stage renal disease: Secondary | ICD-10-CM | POA: Diagnosis present

## 2022-01-30 DIAGNOSIS — D631 Anemia in chronic kidney disease: Secondary | ICD-10-CM | POA: Diagnosis present

## 2022-01-30 DIAGNOSIS — M898X9 Other specified disorders of bone, unspecified site: Secondary | ICD-10-CM | POA: Diagnosis present

## 2022-01-30 DIAGNOSIS — R55 Syncope and collapse: Secondary | ICD-10-CM | POA: Diagnosis not present

## 2022-01-30 DIAGNOSIS — E109 Type 1 diabetes mellitus without complications: Secondary | ICD-10-CM | POA: Diagnosis present

## 2022-01-30 DIAGNOSIS — F1120 Opioid dependence, uncomplicated: Secondary | ICD-10-CM | POA: Diagnosis present

## 2022-01-30 DIAGNOSIS — Z7982 Long term (current) use of aspirin: Secondary | ICD-10-CM

## 2022-01-30 DIAGNOSIS — E875 Hyperkalemia: Secondary | ICD-10-CM | POA: Diagnosis not present

## 2022-01-30 DIAGNOSIS — G894 Chronic pain syndrome: Secondary | ICD-10-CM | POA: Diagnosis present

## 2022-01-30 DIAGNOSIS — Z87891 Personal history of nicotine dependence: Secondary | ICD-10-CM

## 2022-01-30 DIAGNOSIS — R1084 Generalized abdominal pain: Principal | ICD-10-CM

## 2022-01-30 DIAGNOSIS — N186 End stage renal disease: Secondary | ICD-10-CM | POA: Diagnosis present

## 2022-01-30 DIAGNOSIS — Z8249 Family history of ischemic heart disease and other diseases of the circulatory system: Secondary | ICD-10-CM

## 2022-01-30 DIAGNOSIS — R0902 Hypoxemia: Secondary | ICD-10-CM

## 2022-01-30 DIAGNOSIS — K3184 Gastroparesis: Secondary | ICD-10-CM | POA: Diagnosis present

## 2022-01-30 LAB — COMPREHENSIVE METABOLIC PANEL
ALT: 12 U/L (ref 0–44)
AST: 10 U/L — ABNORMAL LOW (ref 15–41)
Albumin: 3.4 g/dL — ABNORMAL LOW (ref 3.5–5.0)
Alkaline Phosphatase: 100 U/L (ref 38–126)
Anion gap: 14 (ref 5–15)
BUN: 59 mg/dL — ABNORMAL HIGH (ref 6–20)
CO2: 25 mmol/L (ref 22–32)
Calcium: 9.5 mg/dL (ref 8.9–10.3)
Chloride: 101 mmol/L (ref 98–111)
Creatinine, Ser: 9.76 mg/dL — ABNORMAL HIGH (ref 0.44–1.00)
GFR, Estimated: 5 mL/min — ABNORMAL LOW (ref >60)
Glucose, Bld: 104 mg/dL — ABNORMAL HIGH (ref 70–99)
Potassium: 5.4 mmol/L — ABNORMAL HIGH (ref 3.5–5.1)
Sodium: 140 mmol/L (ref 135–145)
Total Bilirubin: 0.7 mg/dL (ref 0.3–1.2)
Total Protein: 6.7 g/dL (ref 6.5–8.1)

## 2022-01-30 LAB — CBC
HCT: 36.6 % (ref 36.0–46.0)
Hemoglobin: 11.4 g/dL — ABNORMAL LOW (ref 12.0–15.0)
MCH: 31.8 pg (ref 26.0–34.0)
MCHC: 31.1 g/dL (ref 30.0–36.0)
MCV: 102.2 fL — ABNORMAL HIGH (ref 80.0–100.0)
Platelets: 126 10*3/uL — ABNORMAL LOW (ref 150–400)
RBC: 3.58 MIL/uL — ABNORMAL LOW (ref 3.87–5.11)
RDW: 16.8 % — ABNORMAL HIGH (ref 11.5–15.5)
WBC: 5.6 10*3/uL (ref 4.0–10.5)
nRBC: 0 % (ref 0.0–0.2)

## 2022-01-30 LAB — LACTIC ACID, PLASMA: Lactic Acid, Venous: 0.9 mmol/L (ref 0.5–1.9)

## 2022-01-30 LAB — LIPASE, BLOOD: Lipase: 39 U/L (ref 11–51)

## 2022-01-30 LAB — TROPONIN I (HIGH SENSITIVITY)
Troponin I (High Sensitivity): 24 ng/L — ABNORMAL HIGH (ref <18)
Troponin I (High Sensitivity): 26 ng/L — ABNORMAL HIGH (ref <18)

## 2022-01-30 LAB — POTASSIUM: Potassium: 2.7 mmol/L — CL (ref 3.5–5.1)

## 2022-01-30 IMAGING — DX DG CHEST 1V PORT
1 series · 1 of 1 positions shown · non-contrast
Comparison: Chest x-ray [DATE].

CLINICAL DATA: Chest pain.

EXAM:
PORTABLE CHEST 1 VIEW

[chest ap]
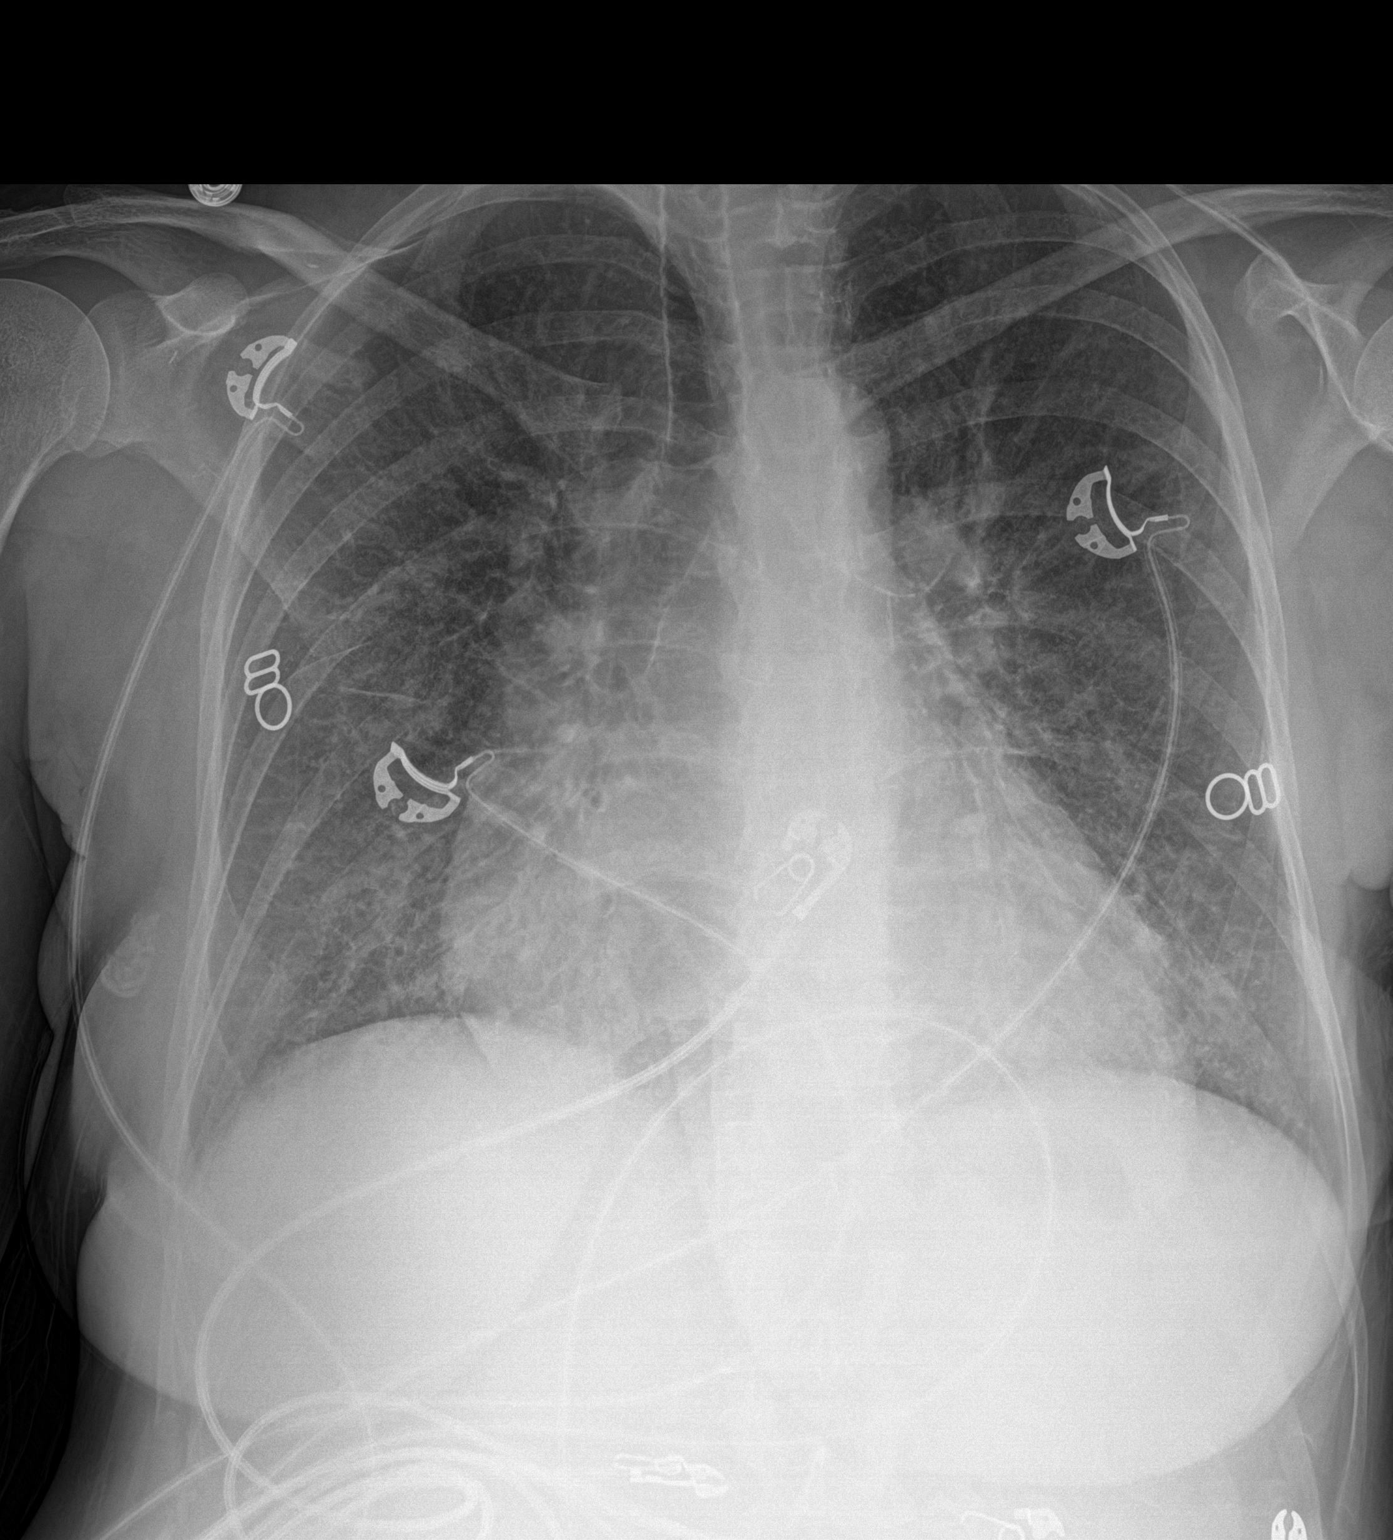

[1 of 1 positions shown; findings below may reference images not displayed]

FINDINGS: Heart is enlarged. There central pulmonary vascular congestion.
There is no pleural effusion or pneumothorax. No acute fractures are
seen.
IMPRESSION: 1. Cardiomegaly with central pulmonary vascular congestion.

## 2022-01-30 IMAGING — DX DG ABDOMEN 1V
1 series · 1 of 1 positions shown · non-contrast
Comparison: [DATE]

CLINICAL DATA: Abdominal pain, nausea

EXAM:
ABDOMEN - 1 VIEW

[abdomen supine]
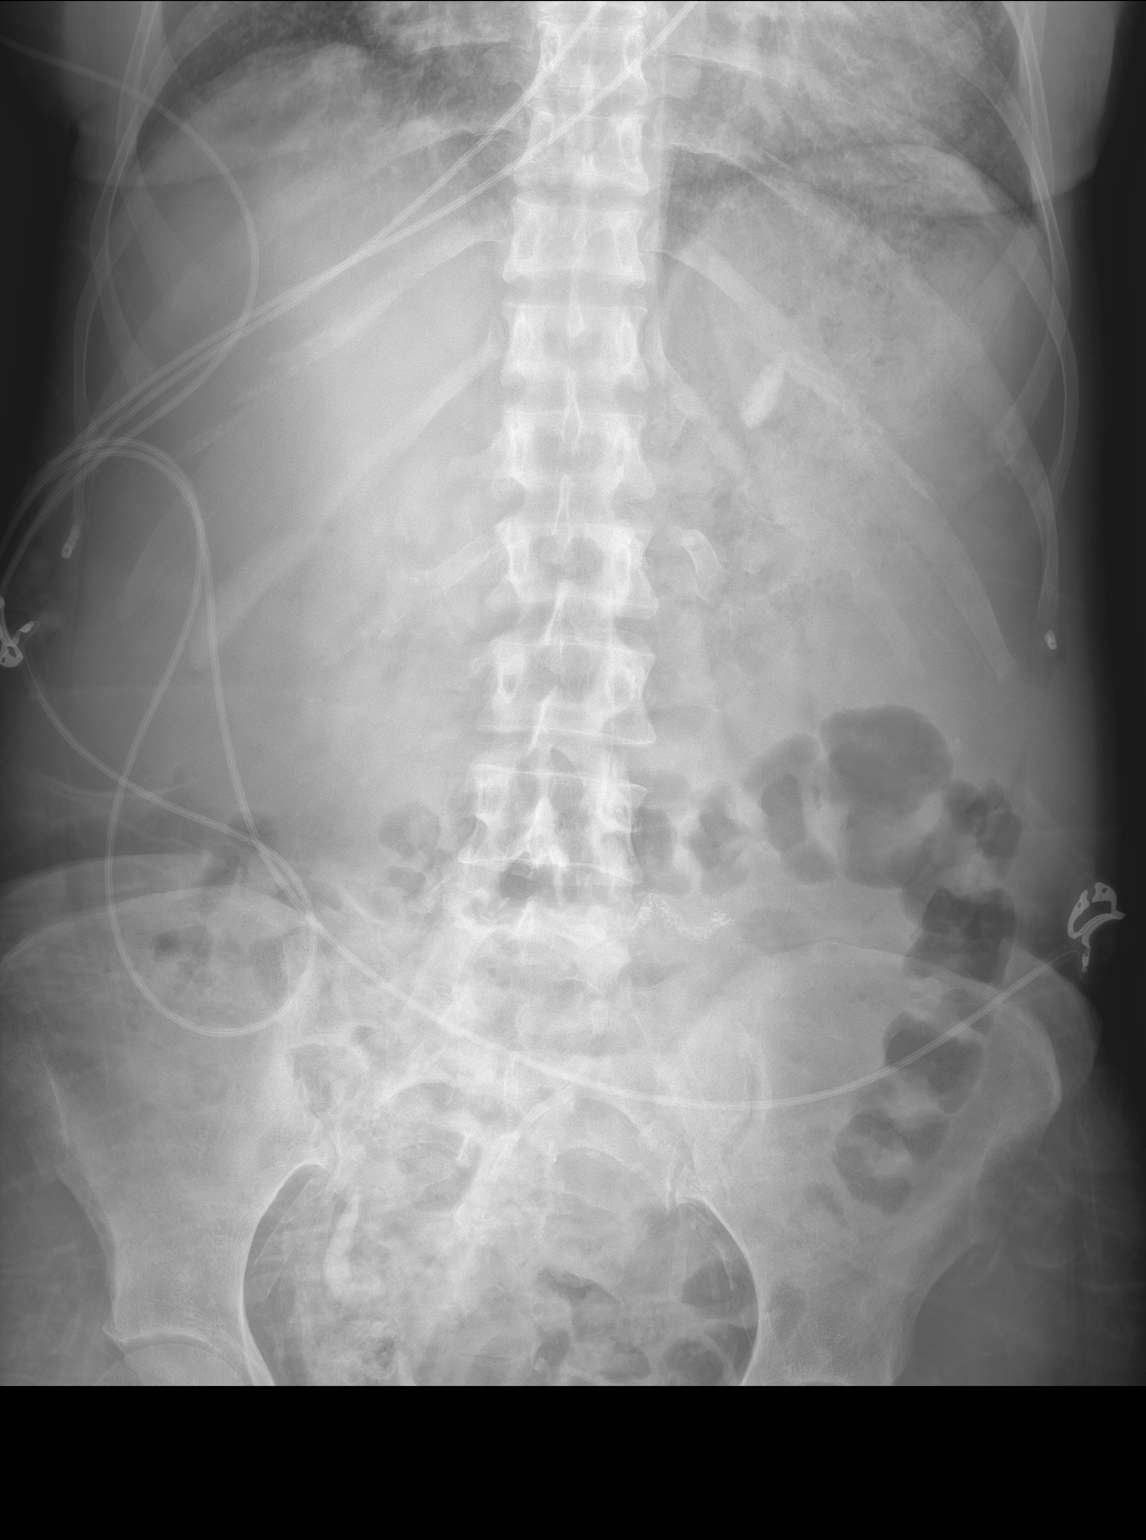

[1 of 1 positions shown; findings below may reference images not displayed]

FINDINGS: Supine frontal view of the abdomen and pelvis demonstrates an
unremarkable bowel gas pattern. Vascular calcifications are seen
throughout the abdomen and pelvis. No masses or abnormal
calcifications. No acute bony abnormalities.
IMPRESSION: 1. Unremarkable bowel gas pattern.

## 2022-01-30 IMAGING — CT CT ANGIO CHEST-ABD-PELV FOR DISSECTION W/ AND WO/W CM
2 of 8 series · 14 of 46 positions shown, 16 images · non-contrast
Comparison: Abdominal CT [DATE]

CLINICAL DATA: Chest or back pain, aortic dissection suspected.

EXAM:
CT ANGIOGRAPHY CHEST, ABDOMEN AND PELVIS
TECHNIQUE: Non-contrast CT of the chest was initially obtained.

[Series 7: dissection 2mm · axial · 0.72mm/px · z∈[+690,+1228]mm · 11 of 305 slices shown, 13 images]
[im 18/305  soft-tissue]
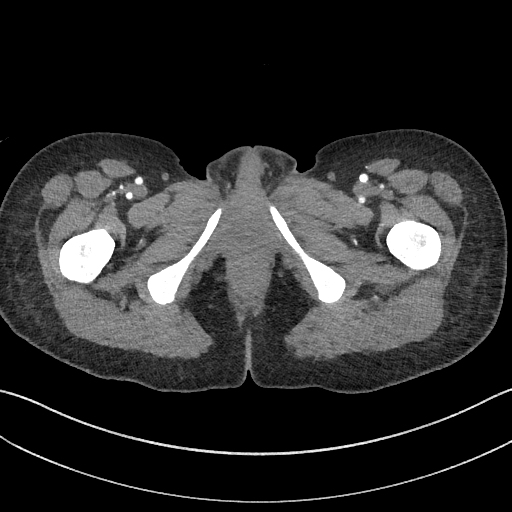
[im 18/305  bone]
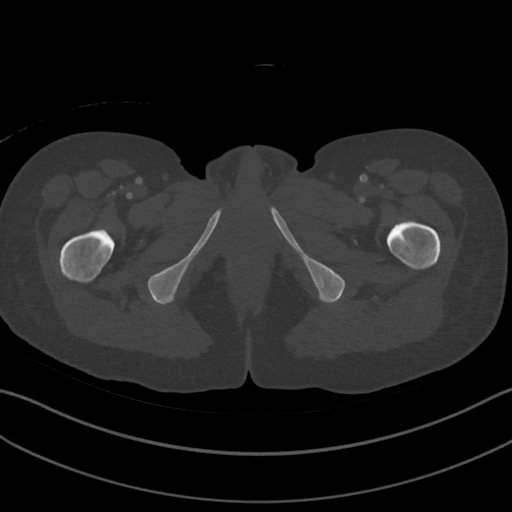
[im 54/305  soft-tissue]
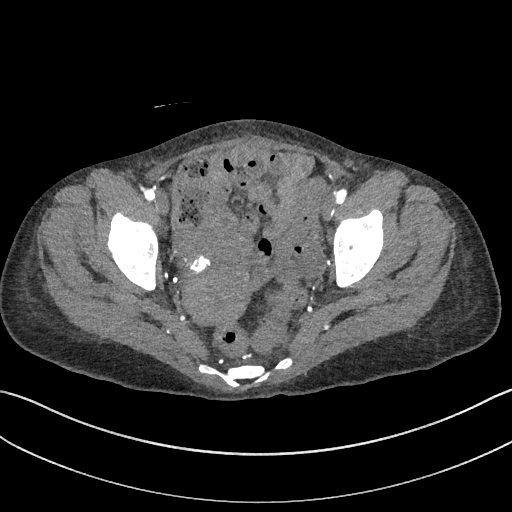
[im 72/305  soft-tissue]
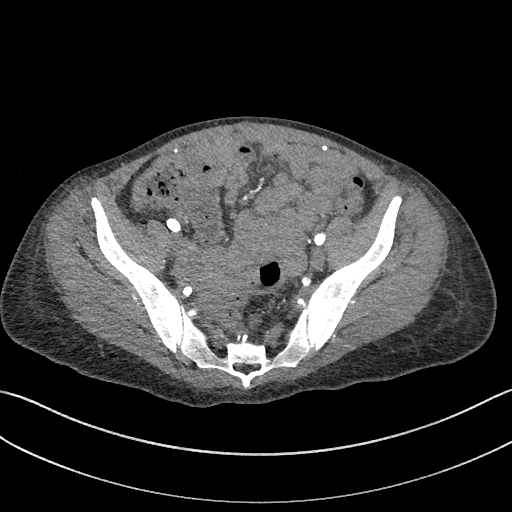
[im 108/305  soft-tissue]
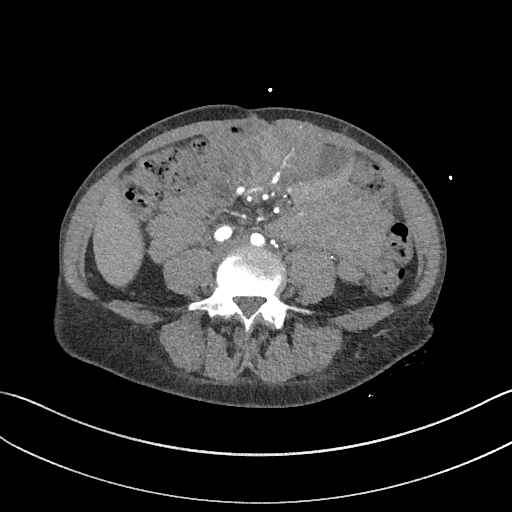
[im 126/305  soft-tissue]
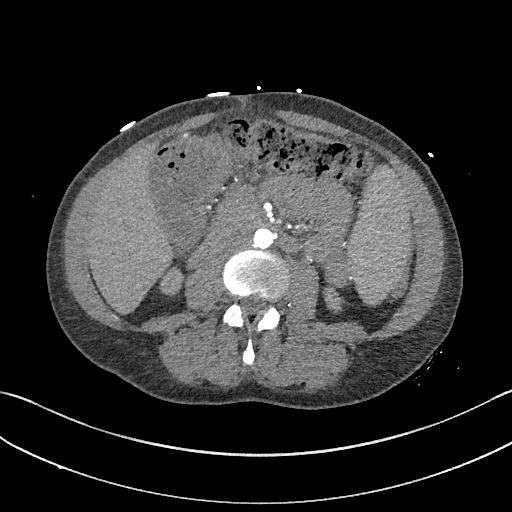
[im 161/305  soft-tissue]
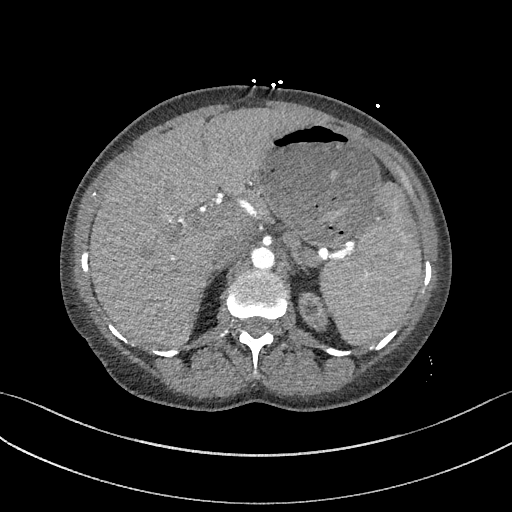
[im 179/305  soft-tissue]
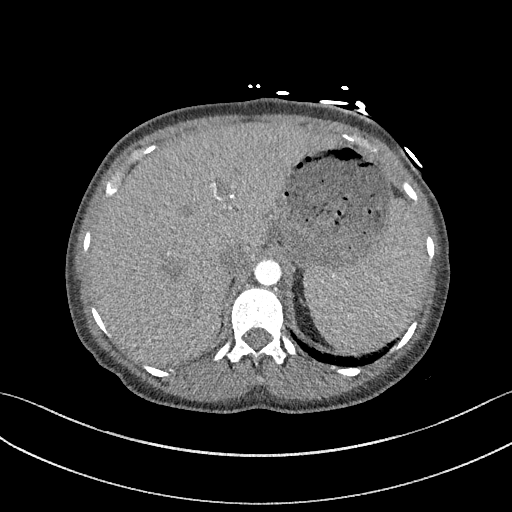
[im 197/305  soft-tissue]
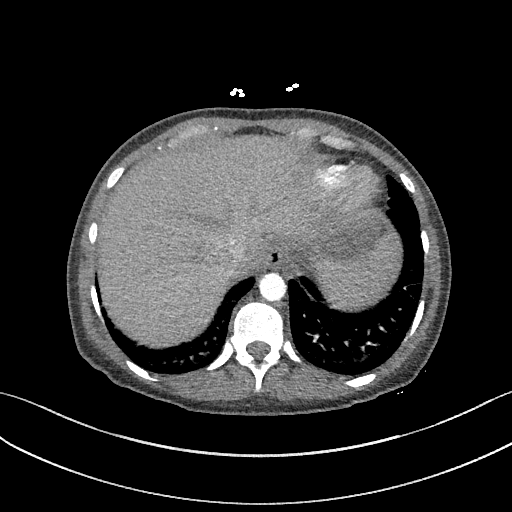
[im 233/305  soft-tissue]
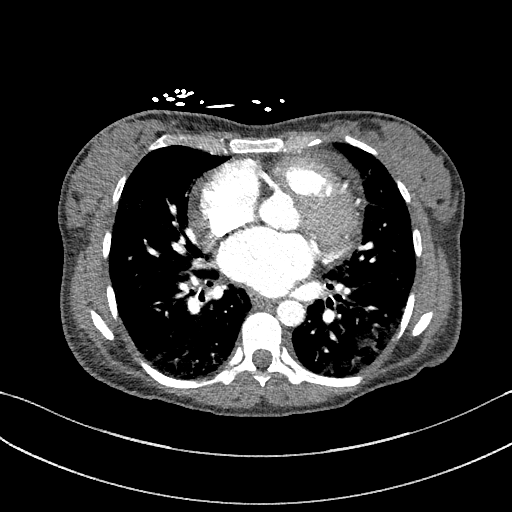
[im 233/305  bone]
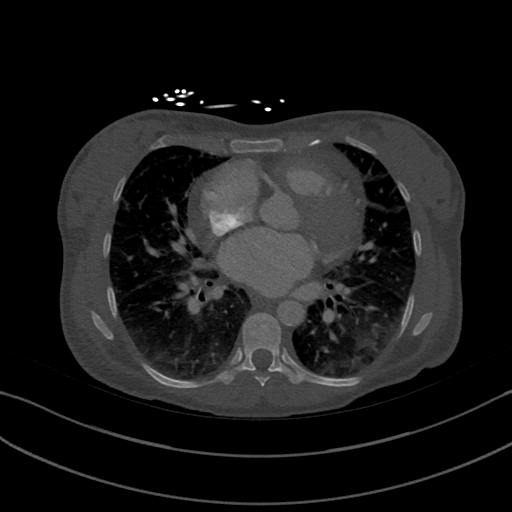
[im 251/305  soft-tissue]
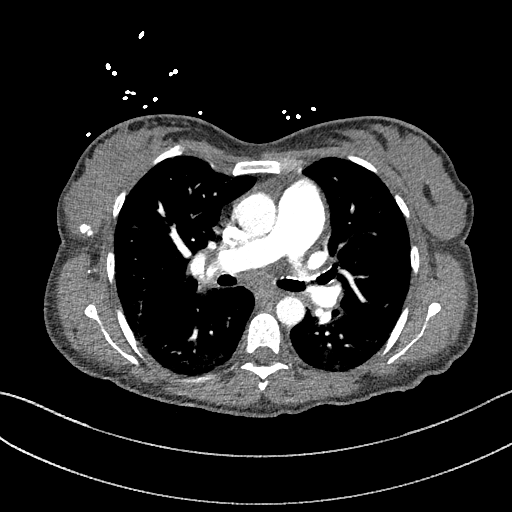
[im 287/305  soft-tissue]
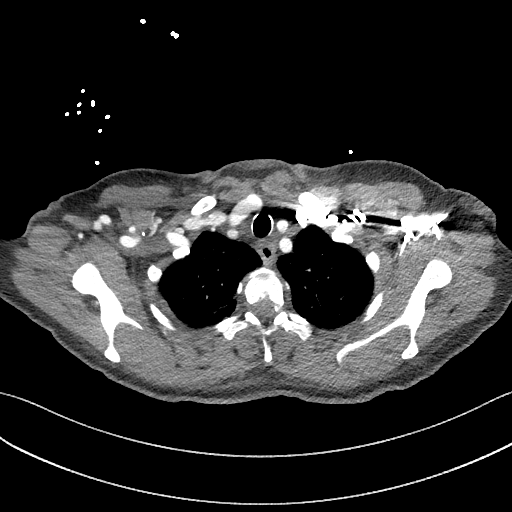

[Series 10: dissection 2mm cor · coronal · 0.89mm/px · 3 of 123 slices shown]
[im 31/123  soft-tissue]
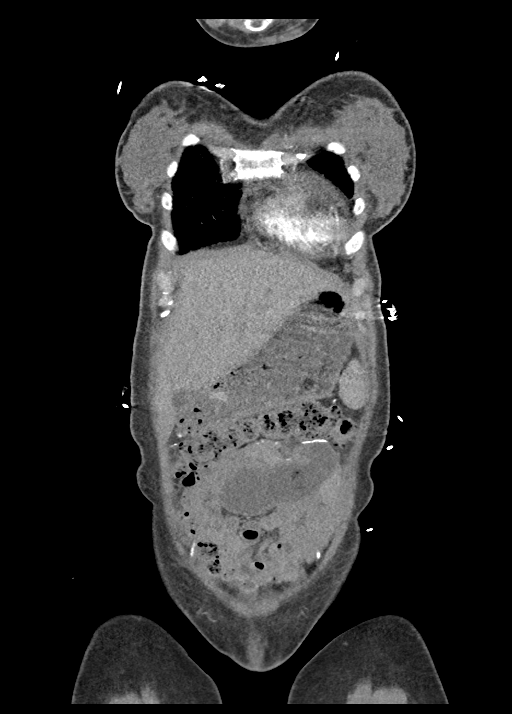
[im 62/123  soft-tissue]
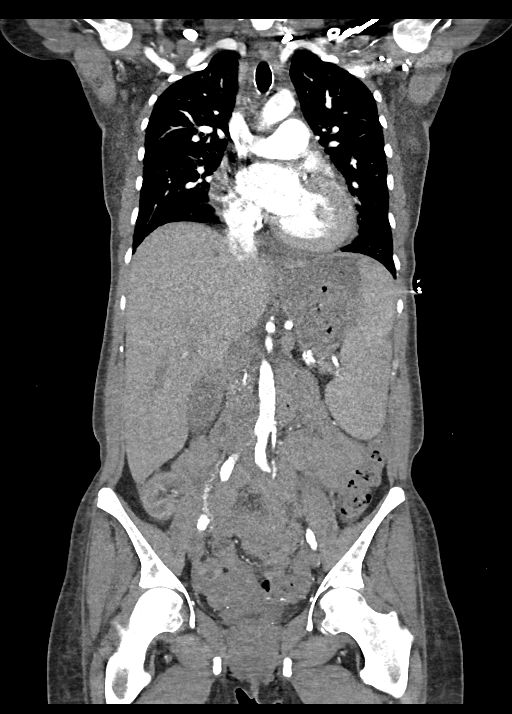
[im 92/123  soft-tissue]
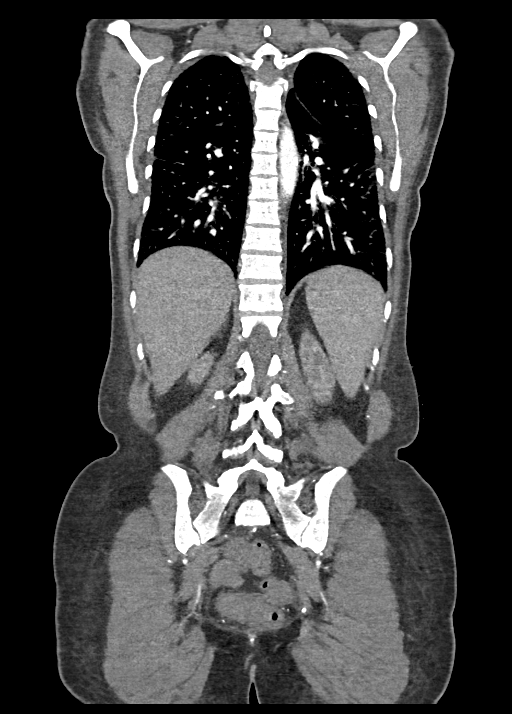

[14 of 46 positions shown; findings below may reference images not displayed]

Multidetector CT imaging through the chest, abdomen and pelvis was
performed using the standard protocol during bolus administration of
intravenous contrast. Multiplanar reconstructed images and MIPs were
obtained and reviewed to evaluate the vascular anatomy.

RADIATION DOSE REDUCTION: This exam was performed according to the
departmental dose-optimization program which includes automated
exposure control, adjustment of the mA and/or kV according to
patient size and/or use of iterative reconstruction technique.

CONTRAST:  100mL OMNIPAQUE IOHEXOL 350 MG/ML SOLN
FINDINGS: CTA CHEST FINDINGS

Cardiovascular: Preferential opacification of the thoracic aorta. No
evidence of thoracic aortic aneurysm or dissection. Enlarged heart
size. Small volume low-density pericardial effusion. Extensive
atheromatous calcification of the aorta and coronaries.

Mediastinum/Nodes: Negative for adenopathy or mass.
Coarse/benign-appearing calcification in the lateral right breast.

Lungs/Pleura: Symmetric interlobular septal thickening and dependent
patchy ground-glass opacity. No pleural effusion or pneumothorax

Musculoskeletal: Benign-appearing lucency in the T9 vertebral body.
No acute or aggressive finding.

Review of the MIP images confirms the above findings.

CTA ABDOMEN AND PELVIS FINDINGS

VASCULAR

Aorta: Diffuse atheromatous calcification

Celiac: Arterial calcification without stenosis or major branch
occlusion

SMA: Arterial calcification without proximal stenosis or major
branch occlusion.

Native renals: Extensive calcification. Bilateral stenosis which is
difficult to quantify due to degree of calcified wall, likely flow
limiting on both sides

Transplant renal artery: Patent.

IMA: Patent with extensive calcification

Inflow: Diffuse atheromatous calcification. No stenosis or
dissection.

Veins: Unremarkable in the arterial phase.

Pancreas transplant with anastomosis at the right common iliac
artery, unremarkable.

Review of the MIP images confirms the above findings.

NON-VASCULAR

Hepatobiliary: No focal liver abnormality.No evidence of biliary
obstruction or stone.

Pancreas: Native and transplant pancreas appears unremarkable

Spleen: Chronic enlargement with 14 cm anterior to posterior span.
No focal abnormality

Adrenals/Urinary Tract: Negative adrenals. Symmetric native renal
atrophy. Unchanged appearance of small transplant kidney in the
right iliac fossa. No hydronephrosis. Unremarkable bladder.

Stomach/Bowel: Loop of bowel adjacent to pancreas transplant that
measures up to 3.6 cm in diameter. Dilatation of a lesser degree was
seen on a [F2] CT. Moderate distention of the stomach which could be
from a recent meal or gastroparesis. Diffuse colonic stool without
colonic obstructive pattern. No visible bowel inflammation

Lymphatic: No mass or adenopathy.

Reproductive:Interval dominant follicle on the left

Other: No ascites or pneumoperitoneum.

Musculoskeletal: No acute abnormalities.

Review of the MIP images confirms the above findings.
IMPRESSION: 1. Negative for acute aortic syndrome.
2. CHF.
3. Distended loop of small bowel adjacent to pancreas transplant,
likely postoperative aneurysmal dilatation when compared with prior
imaging.
4. Full stomach which may be from a recent meal or history of gastro
paresis.
5. Chronic findings are described above.

## 2022-01-30 IMAGING — CT CT HEAD W/O CM
4 series · 16 of 47 positions shown, 18 images · non-contrast
Comparison: Head CT [DATE].

CLINICAL DATA: 41-year-old female with history of syncope. Head
trauma. Dizziness.



[Series 4: head without · axial · non-contrast · 0.38mm/px · z∈[+1350,+1460]mm · 7 of 30 slices shown, 9 images]
[im 4/30  brain]
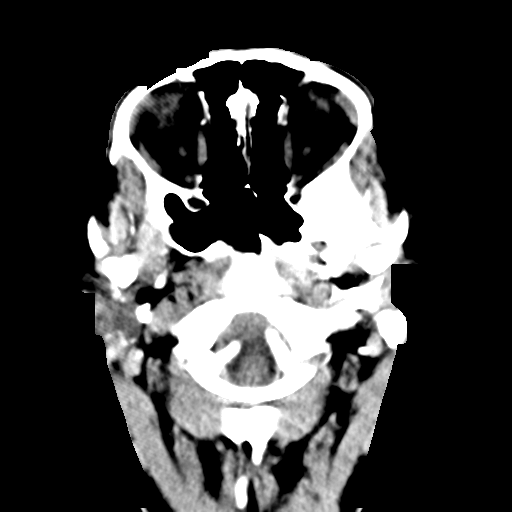
[im 4/30  bone]
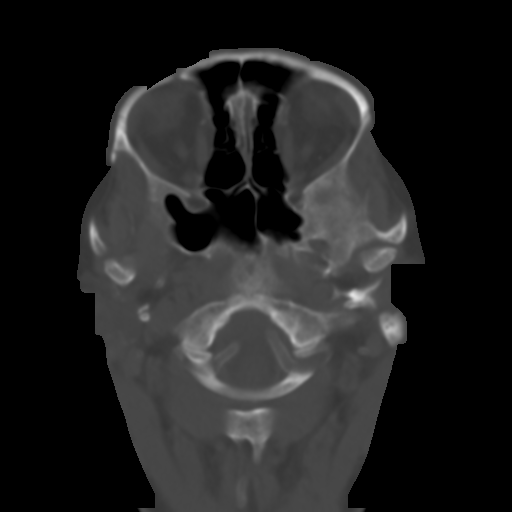
[im 8/30  brain]
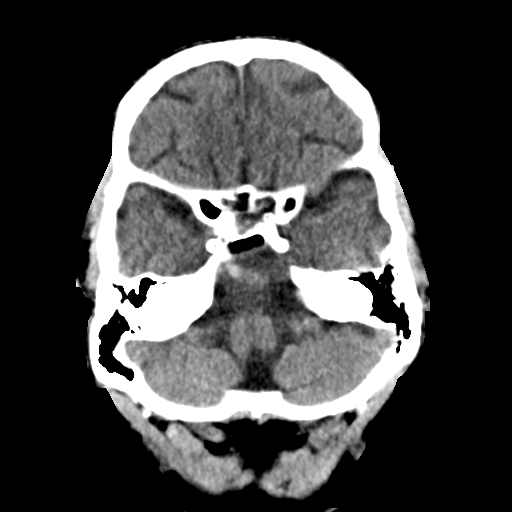
[im 11/30  brain]
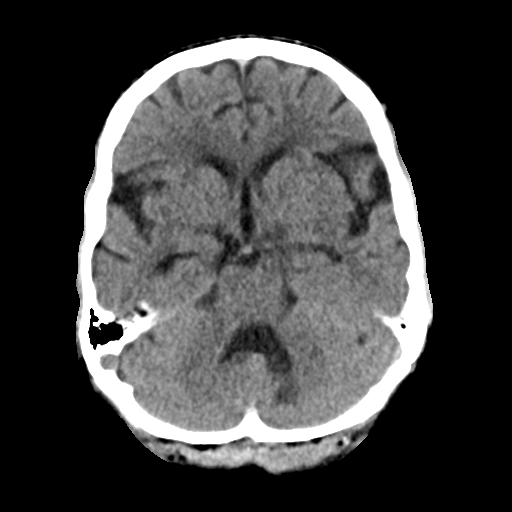
[im 15/30  brain]
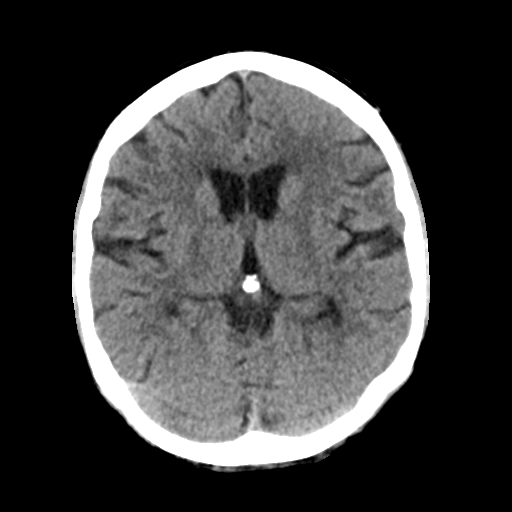
[im 19/30  brain]
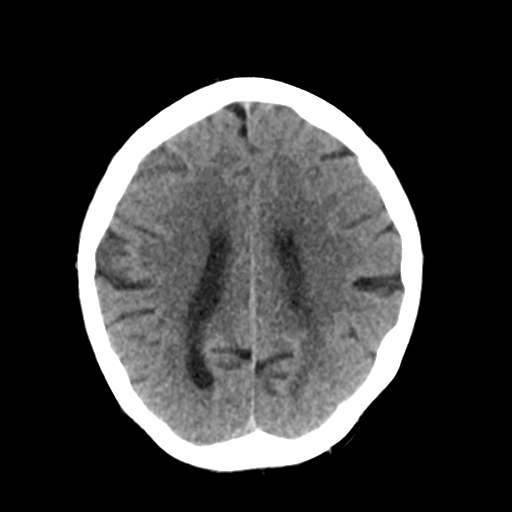
[im 19/30  bone]
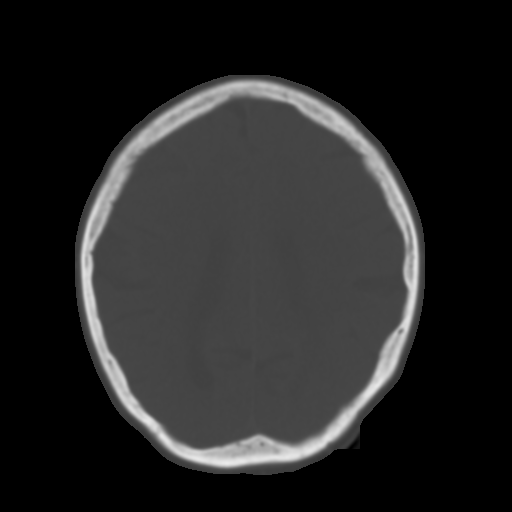
[im 22/30  brain]
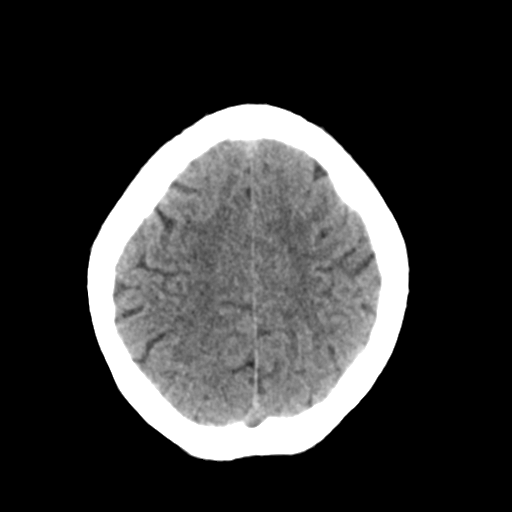
[im 26/30  brain]
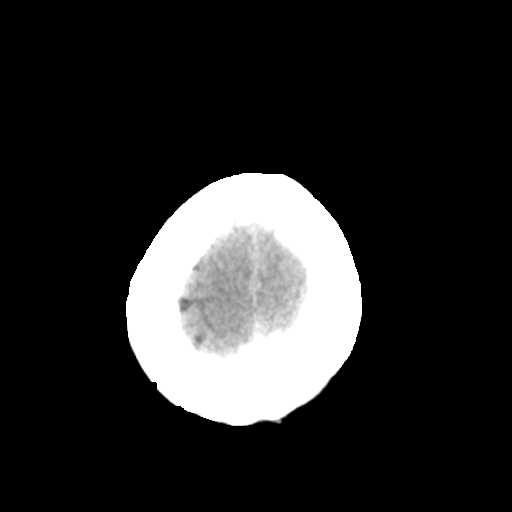

[Series 5: head bone · axial · 0.38mm/px · z∈[+1349,+1377]mm · 3 of 74 slices shown]
[im 8/74  bone]
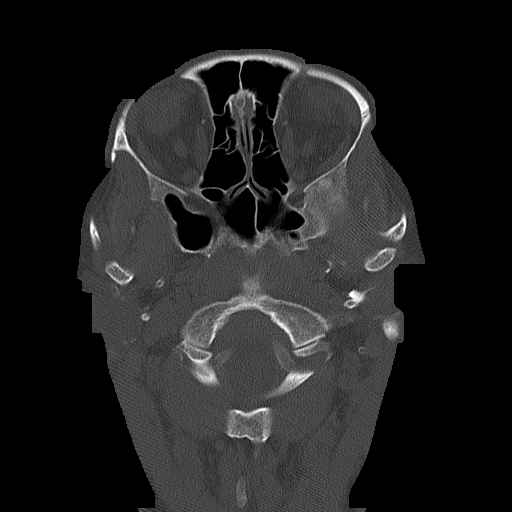
[im 15/74  bone]
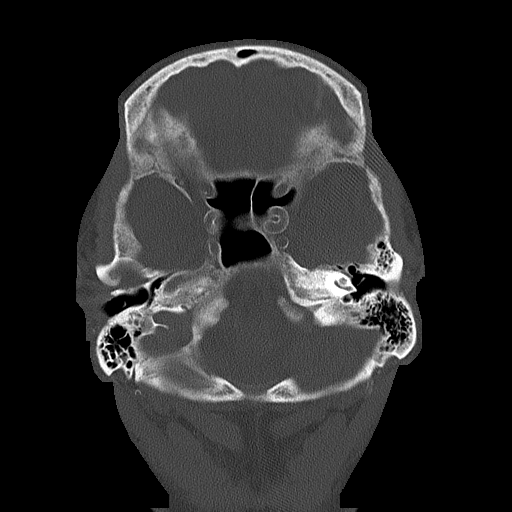
[im 22/74  bone]
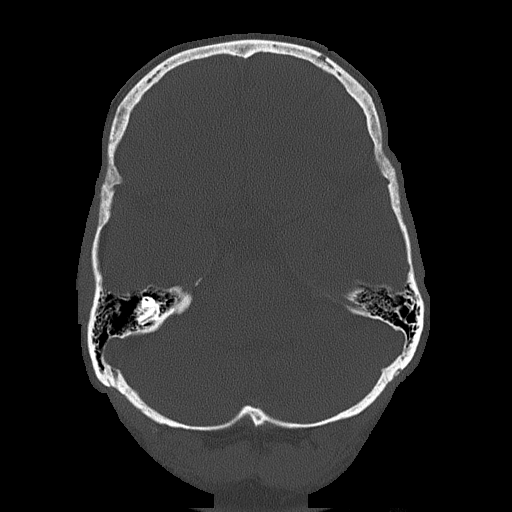

[Series 6: head without cor · coronal · non-contrast · 0.33mm/px · 3 of 65 slices shown]
[im 22/65  brain]
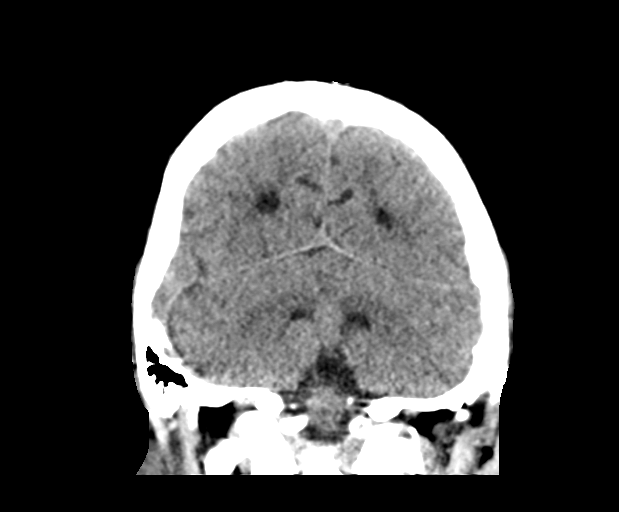
[im 29/65  brain]
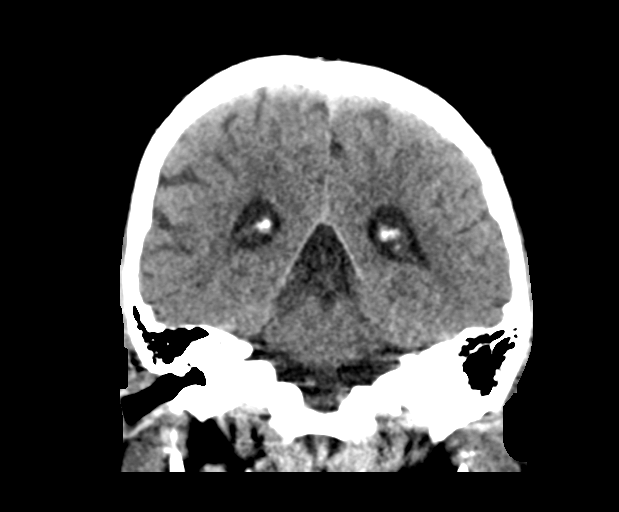
[im 36/65  brain]
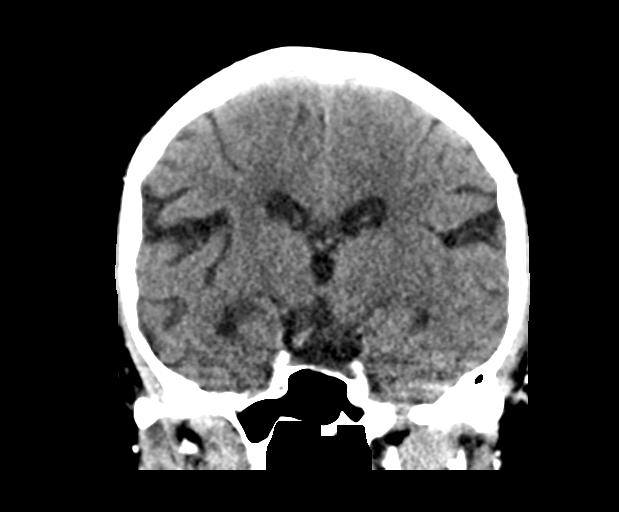

[Series 7: head without sag · sagittal · non-contrast · 0.35mm/px · 3 of 55 slices shown]
[im 19/55  brain]
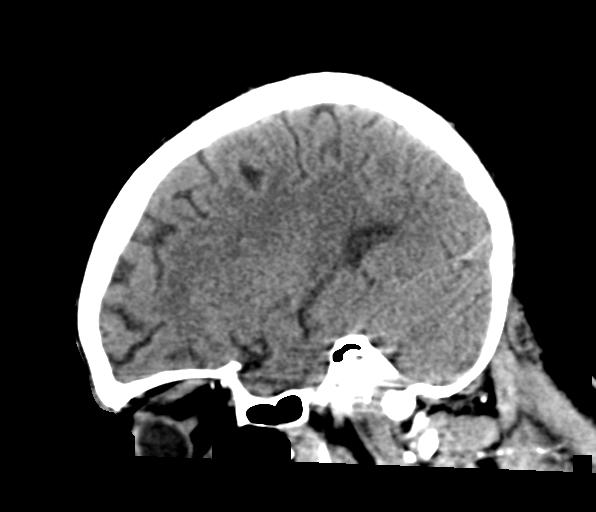
[im 28/55  brain]
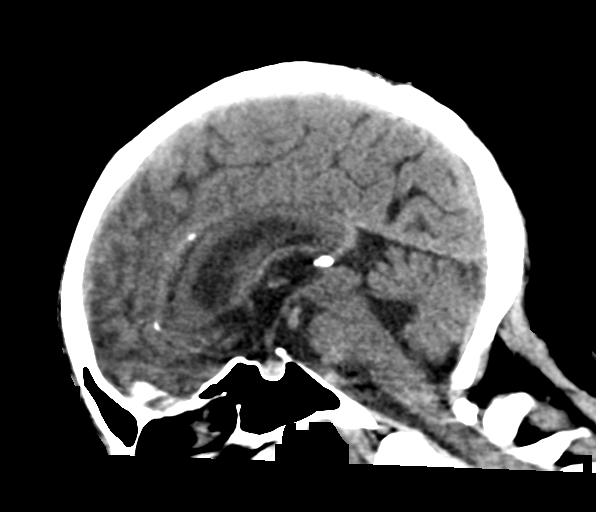
[im 37/55  brain]
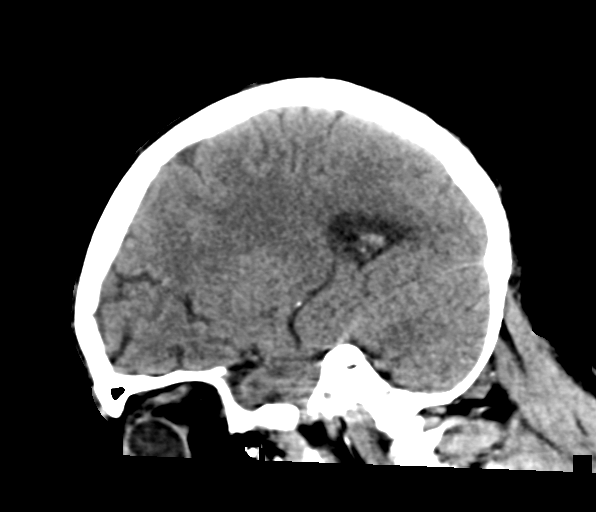

[16 of 47 positions shown; findings below may reference images not displayed]

FINDINGS: Brain: Well-defined areas of low attenuation in the medial left
cerebellar hemisphere and lateral left cerebellar hemisphere,
similar in comparison to prior examinations, compatible with old
chronic infarcts. Patchy areas of decreased attenuation are noted
throughout the deep and periventricular white matter of the cerebral
hemispheres bilaterally, compatible with mild chronic microvascular
ischemic disease. No evidence of acute infarction, hemorrhage,
hydrocephalus, extra-axial collection or mass lesion/mass effect.

Vascular: No hyperdense vessel or unexpected calcification.

Skull: Normal. Negative for fracture or focal lesion.

Sinuses/Orbits: No acute finding.

Other: None.
IMPRESSION: 1. No acute intracranial abnormalities.
2. Mild chronic microvascular ischemic changes in the cerebral white
matter and old chronic left cerebellar hemisphere infarcts
redemonstrated, as above.

## 2022-01-30 MED ORDER — CALCIUM CARBONATE ANTACID 1250 MG/5ML PO SUSP
500.0000 mg | Freq: Four times a day (QID) | ORAL | Status: DC | PRN
Start: 2022-01-30 — End: 2022-02-05

## 2022-01-30 MED ORDER — HYDRALAZINE HCL 20 MG/ML IJ SOLN
5.0000 mg | INTRAMUSCULAR | Status: DC | PRN
  Administered 2022-01-30: 5 mg via INTRAVENOUS
  Filled 2022-01-30 (×3): qty 1

## 2022-01-30 MED ORDER — SORBITOL 70 % SOLN: 30.0000 mL | Status: DC | PRN

## 2022-01-30 MED ORDER — ASPIRIN 81 MG PO CHEW
81.0000 mg | CHEWABLE_TABLET | Freq: Every day | ORAL | Status: DC
  Administered 2022-01-31 – 2022-02-05 (×5): 81 mg via ORAL
  Filled 2022-01-30 (×5): qty 1

## 2022-01-30 MED ORDER — DIPHENHYDRAMINE HCL 25 MG PO CAPS: 50.0000 mg | ORAL_CAPSULE | Freq: Once | ORAL | Status: DC

## 2022-01-30 MED ORDER — NEPRO/CARBSTEADY PO LIQD
237.0000 mL | Freq: Three times a day (TID) | ORAL | Status: DC | PRN
Start: 2022-01-30 — End: 2022-02-05

## 2022-01-30 MED ORDER — SODIUM CHLORIDE 0.9% FLUSH
3.0000 mL | Freq: Two times a day (BID) | INTRAVENOUS | Status: DC
Start: 2022-01-30 — End: 2022-02-05
  Administered 2022-01-30 – 2022-02-05 (×12): 3 mL via INTRAVENOUS

## 2022-01-30 MED ORDER — ONDANSETRON HCL 4 MG PO TABS: 4.0000 mg | ORAL_TABLET | Freq: Four times a day (QID) | ORAL | Status: DC | PRN

## 2022-01-30 MED ORDER — CALCIUM ACETATE (PHOS BINDER) 667 MG PO CAPS
2668.0000 mg | ORAL_CAPSULE | Freq: Three times a day (TID) | ORAL | Status: DC
  Administered 2022-01-30 – 2022-02-05 (×4): 2668 mg via ORAL
  Filled 2022-01-30 (×6): qty 4

## 2022-01-30 MED ORDER — SODIUM ZIRCONIUM CYCLOSILICATE 10 G PO PACK
10.0000 g | PACK | ORAL | Status: DC
Start: 2022-01-30 — End: 2022-01-30

## 2022-01-30 MED ORDER — DOCUSATE SODIUM 283 MG RE ENEM: 1.0000 | ENEMA | RECTAL | Status: DC | PRN

## 2022-01-30 MED ORDER — LABETALOL HCL 5 MG/ML IV SOLN
10.0000 mg | INTRAVENOUS | Status: DC | PRN
  Administered 2022-01-30 – 2022-02-03 (×5): 10 mg via INTRAVENOUS
  Filled 2022-01-30 (×7): qty 4

## 2022-01-30 MED ORDER — DOXERCALCIFEROL 4 MCG/2ML IV SOLN
6.0000 ug | INTRAVENOUS | Status: DC
Start: 2022-01-30 — End: 2022-02-05
  Administered 2022-01-30 – 2022-02-03 (×3): 6 ug via INTRAVENOUS
  Filled 2022-01-30 (×6): qty 4

## 2022-01-30 MED ORDER — AMLODIPINE BESYLATE 10 MG PO TABS
10.0000 mg | ORAL_TABLET | Freq: Every day | ORAL | Status: DC
  Administered 2022-01-31 – 2022-02-05 (×6): 10 mg via ORAL
  Filled 2022-01-30 (×7): qty 1

## 2022-01-30 MED ORDER — LORAZEPAM 2 MG/ML IJ SOLN
1.0000 mg | Freq: Once | INTRAMUSCULAR | Status: AC | PRN
Start: 2022-01-30 — End: 2022-01-30
  Administered 2022-01-30: 1 mg via INTRAVENOUS
  Filled 2022-01-30: qty 1

## 2022-01-30 MED ORDER — PREDNISONE 5 MG PO TABS
15.0000 mg | ORAL_TABLET | Freq: Every day | ORAL | Status: DC
  Administered 2022-01-30 – 2022-02-05 (×6): 15 mg via ORAL
  Filled 2022-01-30 (×6): qty 1

## 2022-01-30 MED ORDER — HEPARIN SODIUM (PORCINE) 5000 UNIT/ML IJ SOLN
5000.0000 [IU] | Freq: Three times a day (TID) | INTRAMUSCULAR | Status: DC
  Administered 2022-01-30: 5000 [IU] via SUBCUTANEOUS
  Filled 2022-01-30 (×4): qty 1

## 2022-01-30 MED ORDER — MYCOPHENOLATE SODIUM 180 MG PO TBEC
540.0000 mg | DELAYED_RELEASE_TABLET | Freq: Two times a day (BID) | ORAL | Status: DC
  Administered 2022-01-30 – 2022-02-05 (×11): 540 mg via ORAL
  Filled 2022-01-30 (×11): qty 3

## 2022-01-30 MED ORDER — NICOTINE 14 MG/24HR TD PT24: 14.0000 mg | MEDICATED_PATCH | Freq: Every day | TRANSDERMAL | Status: DC | PRN

## 2022-01-30 MED ORDER — LOSARTAN POTASSIUM 50 MG PO TABS
100.0000 mg | ORAL_TABLET | Freq: Every day | ORAL | Status: DC
  Administered 2022-01-31 – 2022-02-05 (×6): 100 mg via ORAL
  Filled 2022-01-30 (×7): qty 2

## 2022-01-30 MED ORDER — ACETAMINOPHEN 325 MG PO TABS
650.0000 mg | ORAL_TABLET | Freq: Four times a day (QID) | ORAL | Status: DC | PRN
  Administered 2022-01-30 – 2022-02-01 (×4): 650 mg via ORAL
  Filled 2022-01-30 (×5): qty 2

## 2022-01-30 MED ORDER — IOHEXOL 350 MG/ML SOLN
100.0000 mL | Freq: Once | INTRAVENOUS | Status: AC | PRN
  Administered 2022-01-30: 100 mL via INTRAVENOUS

## 2022-01-30 MED ORDER — HYDROMORPHONE HCL 1 MG/ML IJ SOLN
1.0000 mg | Freq: Once | INTRAMUSCULAR | Status: AC
  Administered 2022-01-30: 1 mg via INTRAVENOUS
  Filled 2022-01-30: qty 1

## 2022-01-30 MED ORDER — CHLORHEXIDINE GLUCONATE CLOTH 2 % EX PADS
6.0000 | MEDICATED_PAD | Freq: Every day | CUTANEOUS | Status: DC
  Administered 2022-01-30 – 2022-02-04 (×5): 6 via TOPICAL

## 2022-01-30 MED ORDER — ONDANSETRON HCL 4 MG/2ML IJ SOLN
4.0000 mg | Freq: Four times a day (QID) | INTRAMUSCULAR | Status: DC | PRN
  Administered 2022-01-30 – 2022-02-02 (×5): 4 mg via INTRAVENOUS
  Filled 2022-01-30 (×5): qty 2

## 2022-01-30 MED ORDER — FENTANYL CITRATE PF 50 MCG/ML IJ SOSY
25.0000 ug | PREFILLED_SYRINGE | Freq: Once | INTRAMUSCULAR | Status: AC
  Administered 2022-01-30: 25 ug via INTRAVENOUS
  Filled 2022-01-30: qty 1

## 2022-01-30 MED ORDER — ACETAMINOPHEN 650 MG RE SUPP: 650.0000 mg | Freq: Four times a day (QID) | RECTAL | Status: DC | PRN

## 2022-01-30 MED ORDER — HALOPERIDOL LACTATE 5 MG/ML IJ SOLN
2.0000 mg | Freq: Once | INTRAMUSCULAR | Status: DC
Start: 2022-01-30 — End: 2022-01-30

## 2022-01-30 MED ORDER — HYDROXYZINE HCL 25 MG PO TABS
25.0000 mg | ORAL_TABLET | Freq: Three times a day (TID) | ORAL | Status: DC | PRN
  Administered 2022-01-30: 25 mg via ORAL
  Filled 2022-01-30: qty 1

## 2022-01-30 MED ORDER — CLONIDINE HCL 0.1 MG/24HR TD PTWK
0.1000 mg | MEDICATED_PATCH | TRANSDERMAL | Status: DC
  Administered 2022-01-30: 0.1 mg via TRANSDERMAL
  Filled 2022-01-30 (×3): qty 1

## 2022-01-30 MED ORDER — FENTANYL CITRATE PF 50 MCG/ML IJ SOSY
12.5000 ug | PREFILLED_SYRINGE | Freq: Once | INTRAMUSCULAR | Status: AC
  Administered 2022-01-30: 12.5 ug via INTRAVENOUS
  Filled 2022-01-30: qty 1

## 2022-01-30 MED ORDER — FERRIC CITRATE 1 GM 210 MG(FE) PO TABS: 630.0000 mg | ORAL_TABLET | Freq: Three times a day (TID) | ORAL | Status: DC

## 2022-01-30 MED ORDER — DIPHENHYDRAMINE HCL 50 MG/ML IJ SOLN: 50.0000 mg | Freq: Once | INTRAMUSCULAR | Status: DC

## 2022-01-30 MED ORDER — CYCLOSPORINE MODIFIED (NEORAL) 100 MG PO CAPS
200.0000 mg | ORAL_CAPSULE | Freq: Two times a day (BID) | ORAL | Status: DC
  Administered 2022-01-30 – 2022-02-05 (×11): 200 mg via ORAL
  Filled 2022-01-30 (×15): qty 2

## 2022-01-30 MED ORDER — ALBUTEROL SULFATE (2.5 MG/3ML) 0.083% IN NEBU: 2.5000 mg | INHALATION_SOLUTION | RESPIRATORY_TRACT | Status: DC | PRN

## 2022-01-30 MED ORDER — METHYLPREDNISOLONE SODIUM SUCC 40 MG IJ SOLR
40.0000 mg | Freq: Once | INTRAMUSCULAR | Status: AC
  Administered 2022-01-30: 40 mg via INTRAVENOUS
  Filled 2022-01-30: qty 1

## 2022-01-30 MED ORDER — HYDRALAZINE HCL 20 MG/ML IJ SOLN
5.0000 mg | Freq: Once | INTRAMUSCULAR | Status: AC
  Administered 2022-01-30: 5 mg via INTRAVENOUS

## 2022-01-30 MED ORDER — CAMPHOR-MENTHOL 0.5-0.5 % EX LOTN: 1.0000 "application " | TOPICAL_LOTION | Freq: Three times a day (TID) | CUTANEOUS | Status: DC | PRN

## 2022-01-30 MED ORDER — HYDRALAZINE HCL 20 MG/ML IJ SOLN
5.0000 mg | INTRAMUSCULAR | Status: DC | PRN
  Administered 2022-01-30 – 2022-02-02 (×8): 5 mg via INTRAVENOUS
  Filled 2022-01-30 (×7): qty 1

## 2022-01-30 MED ORDER — LABETALOL HCL 5 MG/ML IV SOLN
20.0000 mg | Freq: Once | INTRAVENOUS | Status: AC
  Administered 2022-01-30: 20 mg via INTRAVENOUS
  Filled 2022-01-30: qty 4

## 2022-01-30 MED ORDER — FERRIC CITRATE 1 GM 210 MG(FE) PO TABS
630.0000 mg | ORAL_TABLET | Freq: Three times a day (TID) | ORAL | Status: DC
  Administered 2022-01-30 – 2022-02-05 (×13): 630 mg via ORAL
  Filled 2022-01-30 (×13): qty 3

## 2022-01-30 MED ORDER — ZOLPIDEM TARTRATE 5 MG PO TABS
5.0000 mg | ORAL_TABLET | Freq: Every evening | ORAL | Status: DC | PRN
  Administered 2022-01-30 – 2022-02-02 (×4): 5 mg via ORAL
  Filled 2022-01-30 (×4): qty 1

## 2022-01-30 NOTE — ED Notes (Signed)
Patient to CT.

## 2022-01-30 NOTE — ED Notes (Signed)
Admit MD at bedside

## 2022-01-30 NOTE — ED Notes (Addendum)
Per MD Bero, patient ok to scan without pregnancy test as patient's hcg was negative a week ago.

## 2022-01-30 NOTE — Consult Note (Signed)
Renal Service Consult Note [facility]  [name] [name] 01/30/2022 [name], MD Requesting Physician: Dr. Lorin Mercy, [name] [name].   Reason for Consult: ESRD pt w/ palpitations and abd pain/ chest pain HPI: The patient is a 41 y.o. year-old w/ hx of HTN, chronic pain, ESRD on HD, vision loss, hx kidney/ pancreas transplant (kidney failed, pancreas still working on IS x 3) presented to ED early this am for palpitations, chest pain and abd pain, diarrhea and anxiety. BP was high yesterday at home. Has not missed her medications. Went to [facility] on 5/22, was evaluated and sent home. In ED CTA chest is negative for dissection. CXR showing CHF.   Pt seen in ED, main c/o is L sided chest and L shoulder pain. No sig SOB, leg swelling. Also abd pain, vague about this. Has hx of narcotic dependence.   ROS - denies CP, no joint pain, no HA, no blurry vision, no rash, no diarrhea, no nausea/ vomiting, no dysuria, no difficulty voiding   Past Medical History  Past Medical History:  Diagnosis Date   Anemia    Anxiety    ESRD (end stage renal disease) ([place])    Henmodialysis   Gastroparesis    Hypertension    Narcotic abuse ([name] [name])    Non compliance with medical treatment    Renal disorder 2015   transplant   Vision loss    Past Surgical History  Past Surgical History:  Procedure Laterality Date   A/V FISTULAGRAM Right 02/17/2021   Procedure: A/V FISTULAGRAM;  Surgeon: [name], MD;  Location: [place] [facility];  Service: Cardiovascular;  Laterality: Right;   AV FISTULA PLACEMENT  11/17/2011   Procedure: ARTERIOVENOUS (AV) FISTULA CREATION;  Surgeon: Rosetta Posner, MD;  Location: [place];  Service: Vascular;  Laterality: Right;   BIOPSY  12/03/2021   Procedure: BIOPSY;  Surgeon: Doran Stabler, MD;  Location: [place];  Service: Gastroenterology;;   ESOPHAGOGASTRODUODENOSCOPY (EGD) WITH PROPOFOL N/A 12/03/2021   Procedure: ESOPHAGOGASTRODUODENOSCOPY (EGD) WITH PROPOFOL;   Surgeon: Doran Stabler, MD;  Location: [place];  Service: Gastroenterology;  Laterality: N/A;   EYE SURGERY     HEMATOMA EVACUATION Right 02/25/2021   Procedure: EVACUATION HEMATOMA RIGHT CHEST;  Surgeon: Waynetta Sandy, MD;  Location: [place];  Service: Vascular;  Laterality: Right;   INSERTION OF DIALYSIS CATHETER Right 12/08/2020   Procedure: INSERTION OF TUNNELED DIALYSIS CATHETER;  Surgeon: [name], MD;  Location: [facility] OR;  Service: Vascular;  Laterality: Right;   IR REMOVAL TUN CV CATH W/O FL  05/03/2021   KIDNEY TRANSPLANT     NEPHRECTOMY TRANSPLANTED ORGAN     pancrease transplant     PERIPHERAL VASCULAR BALLOON ANGIOPLASTY Right 02/17/2021   Procedure: PERIPHERAL VASCULAR BALLOON ANGIOPLASTY;  Surgeon: [name], MD;  Location: [place] [facility];  Service: Cardiovascular;  Laterality: Right;   REFRACTIVE SURGERY     REVISON OF ARTERIOVENOUS FISTULA Right 12/08/2020   Procedure: RIGHT ARM ARTERIOVENOUS FISTULA REVISION AND RESECTION;  Surgeon: [name], MD;  Location: [place];  Service: Vascular;  Laterality: Right;   REVISON OF ARTERIOVENOUS FISTULA Right 02/25/2021   Procedure: RIGHT ARM ARTERIOVENOUS FISTULA REVISION WITH TRANSPOSITION OF CEPHALIC VEIN ON AXILLARY VEIN;  Surgeon: [name], MD;  [facility]: [facility];  Service: Vascular;  Laterality: Right;   Family History  Family History  Problem Relation Age of Onset   Hypertension Father    Social History  reports that she has quit smoking. Her smoking use included pipe. She has never used smokeless tobacco. She reports that she does not drink alcohol and does not use drugs. Allergies No Known Allergies Home medications Prior to Admission medications   Medication Sig Start Date End Date Taking? Authorizing Provider  amLODipine (NORVASC) 10 MG tablet Take 1 tablet (10 mg total) by mouth daily. 12/05/21 03/05/22  [place] Eric J, DO  aspirin 81 MG chewable tablet Chew 81 mg by  mouth daily.    [provider]  AURYXIA 1 GM 210 MG(Fe) tablet Take 630 mg by mouth 3 (three) times daily. Patient not taking: Reported on 01/08/2022 12/09/21   [provider]  calcium acetate, Phos Binder, (PHOSLYRA) 667 MG/5ML SOLN Take 2,668 mg by mouth 3 (three) times daily with meals. Take 3 tablets with meals and 1 tablet with snacks Patient not taking: Reported on 01/08/2022    [provider]  CATAPRES-TTS-1 0.1 MG/24HR patch Place 1 patch (0.1 mg total) onto the skin once a week. 12/04/21 03/04/22  [place] Donnamarie Poag, DO  cycloSPORINE modified (NEORAL) 100 MG capsule Take 2 capsules (200 mg total) by mouth 2 (two) times daily. 12/25/21   Pokhrel, Corrie Mckusick, MD  Darbepoetin Alfa (ARANESP) 60 MCG/0.3ML SOSY injection Inject 0.3 mLs (60 mcg total) into the vein every Friday with hemodialysis. 11/18/21   Cherene Altes, MD  doxercalciferol (HECTOROL) 4 MCG/2ML injection Inject 3 mLs (6 mcg total) into the vein every Monday, Wednesday, and Friday with hemodialysis. 11/17/21   Cherene Altes, MD  ergocalciferol (VITAMIN D2) 1.25 MG (50000 UT) capsule Take 50,000 Units by mouth once a week.    [provider]  furosemide (LASIX) 20 MG tablet Take 20 mg by mouth daily. Take on non-HD days    [provider]  lidocaine-prilocaine (EMLA) cream Apply 1 application topically daily as needed (port access). 07/09/20   [provider]  losartan (COZAAR) 100 MG tablet Take 1 tablet (100 mg total) by mouth daily. 12/04/21 03/04/22  [place] Donnamarie Poag, DO  mycophenolate (MYFORTIC) 180 MG EC tablet Take 540 mg by mouth 2 (two) times daily.    [provider]  naloxone [facility]) nasal spray 4 mg/0.1 mL Place 1 spray into the nose as needed (opioid overdose). Patient not taking: Reported on 01/08/2022    [provider]  oxyCODONE-acetaminophen (PERCOCET/ROXICET) 5-325 MG tablet Take 1-2 tablets by mouth every 6 (six) hours as needed for severe pain. Patient  not taking: Reported on 12/25/2021 03/22/21   [name], MD  predniSONE (DELTASONE) 5 MG tablet Take 15 mg by mouth daily. 06/24/15   [provider]  sodium zirconium cyclosilicate (LOKELMA) 10 g PACK packet Take 10 g by mouth as directed. Take on Non-Dialysis (Sun,Tues, Thurs, Sat)    [provider]     Vitals:   01/30/22 0800 01/30/22 0810 01/30/22 0815 01/30/22 0830  BP: (!) 199/100 (!) 216/96 (!) 201/101 (!) 211/101  Pulse: 99 98 (!) 101 (!) 101  Resp: (!) '27 18 14 15  '$ Temp:      TempSrc:      SpO2: 96% 98% 95% 98%   Exam Gen alert, crying, not in distress No rash, cyanosis or gangrene Sclera anicteric, throat clear  No jvd or bruits Chest clear bilat to bases, no rales/ wheezing RRR no RG Abd soft ntnd no mass or ascites +bs GU defer MS no joint effusions or deformity Ext no LE or UE edema,  no wounds or ulcers Neuro is alert, Ox 3 , nf  RUA AVF+bruit       Home meds include - norvasc 10, asa, auryxia 3 ac, phoslyra 3 ac, catapres patch 0.1 weekly, cyclosporine 200 bid, ergocalciferol, lasix 20 qd, losartan 100, mycophenolate 540 bid, narcan prn, percocet, prednisone 15 qd, lokelma 10 gm prn    CXR - bilat vasc congestion, early bilat IS edema   K+ 5.4  Cr 9.78  BUN 59   Hb 11.4  wBC 5K     OP HD: MWF AF  3h 55mn  53.5kg  400/500   2/2 bath  RUA AVF  Hep none - last HD 5/26, 57.6 > 54.4kg - last Hb 10.6 on 5/24 - mircera 200 q2, last 5/24, due 6/07 - hectorol 8 ug tiw IV - sensipar 90 po tiw  Assessment/ Plan: HTN'sive crisis - w/ abd pain, chest pain and headache. Will challenge dry wt while here and get vol down today w/ HD. Cont home meds, use IV meds prn if needed.  ESRD - on HD MWF. Has not missed HD. HD today upstairs 2nd shfit.  Hx renal / pancreas transplant - kidney has rejected, pancreas still working Type 1 DM - sp panc transplant, is not on any diabetes medications Narcotic dependence - per pmd Anemia ckd - Hb 11.4, no  esa needs MBD ckd - Ca in range, will add on phos. Cont binders x 2 and IV vdra      RKelly Splinter MD 01/30/2022, 9:12 AM Recent Labs  Lab 01/30/22 0337  HGB 11.4*  ALBUMIN 3.4*  CALCIUM 9.5  CREATININE 9.76*  K 5.4*

## 2022-01-30 NOTE — ED Notes (Signed)
Pt resting with eyes closed, tactile stimuli to awake. Once awake pt moans requesting pain meds. Pt drowsy and easily falls back asleep. Holding med at this time until pt is more awake.

## 2022-01-30 NOTE — ED Notes (Signed)
Patient currently screaming and yelling. Patient reports she's in pain.

## 2022-01-30 NOTE — ED Provider Notes (Signed)
[facility] Emergency Department Provider Note MRN:  [id number]  Arrival date & time: 01/30/22     Chief Complaint   Abdominal Pain   History of Present Illness   [name] [name] is a 41 y.o. year-old female with a history of ESRD presenting to the ED with chief complaint of abdominal pain.  Severe chest and abdominal pain for the past 1 or 2 days.  Was evaluated at [facility] and discharged.  Pain continues, severe high blood pressure as well.  Syncopal episode today, fall, head trauma as well.  Review of Systems  A thorough review of systems was obtained and all systems are negative except as noted in the HPI and PMH.   Patient's Health History    Past Medical History:  Diagnosis Date   Anemia    Anxiety    ESRD (end stage renal disease) ([place])    Henmodialysis   Gastroparesis    Hypertension    Narcotic abuse ([place])    Non compliance with medical treatment    Renal disorder 2015   transplant   Vision loss     Past Surgical History:  Procedure Laterality Date   A/V FISTULAGRAM Right 02/17/2021   Procedure: A/V FISTULAGRAM;  Surgeon: [name], MD;  Location: [place] [facility];  Service: Cardiovascular;  Laterality: Right;   AV FISTULA PLACEMENT  11/17/2011   Procedure: ARTERIOVENOUS (AV) FISTULA CREATION;  Surgeon: Rosetta Posner, MD;  Location: [place];  Service: Vascular;  Laterality: Right;   BIOPSY  12/03/2021   Procedure: BIOPSY;  Surgeon: Doran Stabler, MD;  Location: [place];  Service: Gastroenterology;;   ESOPHAGOGASTRODUODENOSCOPY (EGD) WITH PROPOFOL N/A 12/03/2021   Procedure: ESOPHAGOGASTRODUODENOSCOPY (EGD) WITH PROPOFOL;  Surgeon: Doran Stabler, MD;  Location: [place];  Service: Gastroenterology;  Laterality: N/A;   EYE SURGERY     HEMATOMA EVACUATION Right 02/25/2021   Procedure: EVACUATION HEMATOMA RIGHT CHEST;  Surgeon: Waynetta Sandy, MD;  Location: [place];  Service: Vascular;   Laterality: Right;   INSERTION OF DIALYSIS CATHETER Right 12/08/2020   Procedure: INSERTION OF TUNNELED DIALYSIS CATHETER;  Surgeon: [name], MD;  Location: [facility] OR;  Service: Vascular;  Laterality: Right;   IR REMOVAL TUN CV CATH W/O FL  05/03/2021   KIDNEY TRANSPLANT     NEPHRECTOMY TRANSPLANTED ORGAN     pancrease transplant     PERIPHERAL VASCULAR BALLOON ANGIOPLASTY Right 02/17/2021   Procedure: PERIPHERAL VASCULAR BALLOON ANGIOPLASTY;  Surgeon: [name], MD;  Location: [place] [facility];  Service: Cardiovascular;  Laterality: Right;   REFRACTIVE SURGERY     REVISON OF ARTERIOVENOUS FISTULA Right 12/08/2020   Procedure: RIGHT ARM ARTERIOVENOUS FISTULA REVISION AND RESECTION;  Surgeon: [name], MD;  Location: [facility];  Service: Vascular;  Laterality: Right;   REVISON OF ARTERIOVENOUS FISTULA Right 02/25/2021   Procedure: RIGHT ARM ARTERIOVENOUS FISTULA REVISION WITH TRANSPOSITION OF CEPHALIC VEIN ON AXILLARY VEIN;  Surgeon: [name], MD;  Location: [facility];  Service: Vascular;  Laterality: Right;    Family History  Problem Relation Age of Onset   Hypertension Father     Social History   Socioeconomic History   Marital status: Married    Spouse name: Not on file   Number of children: Not on file   Years of education: Not on file   Highest education level: Not on file  Occupational History   Not on file  Tobacco  Use   Smoking status: Former    Types: Pipe   Smokeless tobacco: Never   Tobacco comments:    Hooka  Vaping Use   Vaping Use: Never used  Substance and Sexual Activity   Alcohol use: No   Drug use: No   Sexual activity: Not Currently  Other Topics Concern   Not on file  Social History Narrative   Worked for [employer]--as did husband. Had to leave [place] for safety reasons. Has been in [place] since 9/09.   Social Determinants of Health   Financial Resource Strain: Not on file  Food Insecurity: Not on file  Transportation Needs:  Not on file  Physical Activity: Not on file  Stress: Not on file  Social Connections: Not on file  Intimate Partner Violence: Not on file     Physical Exam   Vitals:   01/30/22 0330 01/30/22 0645  BP: (!) 217/112 (!) 206/107  Pulse: (!) 103 (!) 104  Resp: 12 16  Temp:    SpO2: 100% 95%    CONSTITUTIONAL: Chronically ill-appearing, in moderate distress due to pain NEURO/PSYCH:  Alert and oriented x 3, no focal deficits EYES:  eyes equal and reactive ENT/NECK:  no LAD, no JVD CARDIO: Regular rate, well-perfused, normal S1 and S2 PULM:  CTAB no wheezing or rhonchi GI/GU:  non-distended, non-tender MSK/SPINE:  No gross deformities, no edema SKIN:  no rash, atraumatic   *Additional and/or pertinent findings included in MDM below  Diagnostic and Interventional Summary    EKG Interpretation  Date/Time:  Monday Jan 30 2022 02:50:29 EDT Ventricular Rate:  98 PR Interval:  140 QRS Duration: 91 QT Interval:  384 QTC Calculation: 491 R Axis:   71 Text Interpretation: Sinus rhythm Minimal ST depression Borderline prolonged QT interval Confirmed by Gerlene Fee [phone]) on 01/30/2022 3:23:27 AM       Labs Reviewed  CBC - Abnormal; Notable for the following components:      Result Value   RBC 3.58 (*)    Hemoglobin 11.4 (*)    MCV 102.2 (*)    RDW 16.8 (*)    Platelets 126 (*)    All other components within normal limits  COMPREHENSIVE METABOLIC PANEL - Abnormal; Notable for the following components:   Potassium 5.4 (*)    Glucose, Bld 104 (*)    BUN 59 (*)    Creatinine, Ser 9.76 (*)    Albumin 3.4 (*)    AST 10 (*)    GFR, Estimated 5 (*)    All other components within normal limits  TROPONIN I (HIGH SENSITIVITY) - Abnormal; Notable for the following components:   Troponin I (High Sensitivity) 24 (*)    All other components within normal limits  TROPONIN I (HIGH SENSITIVITY) - Abnormal; Notable for the following components:   Troponin I (High Sensitivity) 26 (*)     All other components within normal limits  LIPASE, BLOOD  LACTIC ACID, PLASMA    CT Angio Chest/Abd/Pel for Dissection W and/or Wo Contrast  Final Result    CT HEAD WO CONTRAST (5MM)  Final Result    DG Chest Port 1 View  Final Result      Medications  haloperidol lactate (HALDOL) injection 2 mg (has no administration in time range)  HYDROmorphone (DILAUDID) injection 1 mg (1 mg Intravenous Given 01/30/22 0409)  methylPREDNISolone sodium succinate (SOLU-MEDROL) 40 mg/mL injection 40 mg (40 mg Intravenous Given 01/30/22 0408)  LORazepam (ATIVAN) injection 1 mg (1 mg Intravenous  Given 01/30/22 0420)  iohexol (OMNIPAQUE) 350 MG/ML injection 100 mL (100 mLs Intravenous Contrast Given 01/30/22 0515)  labetalol (NORMODYNE) injection 20 mg (20 mg Intravenous Given 01/30/22 0647)     Procedures  /  Critical Care .Critical Care Performed by: Maudie Flakes, MD Authorized by: Maudie Flakes, MD   Critical care provider statement:    Critical care time (minutes):  35   Critical care was necessary to treat or prevent imminent or life-threatening deterioration of the following conditions: Hypertensive emergency.   Critical care was time spent personally by me on the following activities:  Development of treatment plan with patient or surrogate, discussions with consultants, evaluation of patient's response to treatment, examination of patient, ordering and review of laboratory studies, ordering and review of radiographic studies, ordering and performing treatments and interventions, pulse oximetry, re-evaluation of patient's condition and review of old charts  ED Course and Medical Decision Making  Initial Impression and Ddx Differential diagnosis includes aortic dissection, hypertensive emergency, ACS.  Head Noncon imaging at [facility] 1 week ago.  There is a poorly documented contrast allergy in the chart.  I discussed this with the patient and she adamantly denies any contrast  allergy whatsoever.  Per chart review she has had a CT abdomen with contrast and there is no documentation of anything going wrong.  And so, will proceed with CTA imaging to exclude dissection.  EKG is without concerning features.  Past medical/surgical history that increases complexity of ED encounter: ESRD, transplant  Interpretation of Diagnostics I personally reviewed the EKG and my interpretation is as follows: Sinus rhythm, no significant change from prior    Labs reveal no significant blood count or electrolyte disturbance, BUN and creatinine at expected baseline.  Troponin minimally elevated and flat upon repeat.  Patient Reassessment and Ultimate Disposition/Management Patient continues to have a lot of discomfort, p.o. intolerance, still severely hypertensive, and also mildly hypoxic going down to 87% on room air, on 2 L nasal cannula.  Will admit to medicine.  Patient management required discussion with the following services or consulting groups:  Hospitalist Service  Complexity of Problems Addressed Acute illness or injury that poses threat of life of bodily function  Additional Data Reviewed and Analyzed Further history obtained from: Care Everywhere  Additional Factors Impacting ED Encounter Risk Use of parenteral controlled substances and Consideration of hospitalization  Barth Kirks. [place]onia Small, [place] [email or web address]  Final Clinical Impressions(s) / ED Diagnoses     ICD-10-CM   1. Generalized abdominal pain  R10.84     2. Chest pain, unspecified type  R07.9     3. Hypertensive urgency  I16.0     4. Hypoxia  R09.02       ED Discharge Orders     None        Discharge Instructions Discussed with and Provided to Patient:   Discharge Instructions   None      Maudie Flakes, MD 01/30/22 [phone]

## 2022-01-30 NOTE — ED Notes (Signed)
IV access difficult to obtain, MD Bero made aware.

## 2022-01-30 NOTE — H&P (Signed)
History and Physical    Patient: [name] [name] UMP:[id number] DOB: 01/18/1981 DOA: 01/30/2022 DOS: the patient was seen and examined on 01/30/2022 PCP: [facility] [place]  Patient coming from: Home - lives alone; NOK: Leo Grosser [place], [place]   Chief Complaint: Abdominal pain  HPI: [name] [name] is a 41 y.o. 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  She is having abdominal pain and headache.  Symptoms started yesterday.  She has not missed HD, last HD was Friday.  Her BP was really high all day yesterday.  She has not missed any BP medications.  It has been running high for a whole week.  No improvement after HD last week.  +n/v/d.   ER Course:  ESRD, prior transplants.  Severe abdominal pain.  Went to [facility] on 5/22, unremarkable evaluation and went home.  No contrast allergy so CTA dissection study done, negative for dissection.  Severe HTN, n/v, abdominal pain, hypoxia.  CXR with CHF.  ?renal related.  Needs BP and symptom control.      Review of Systems: As mentioned in the history of present illness. All other systems reviewed and are negative. Past Medical History:  Diagnosis Date   Anemia    Anxiety    ESRD (end stage renal disease) ([place])    Henmodialysis   Gastroparesis    Hypertension    Narcotic abuse ([place])    Non compliance with medical treatment    Renal disorder 2015   transplant   Vision loss    Past Surgical History:  Procedure Laterality Date   A/V FISTULAGRAM Right 02/17/2021   Procedure: A/V FISTULAGRAM;  Surgeon: [name], MD;  Location: [facility];  Service: Cardiovascular;  Laterality: Right;   AV FISTULA PLACEMENT  11/17/2011   Procedure: ARTERIOVENOUS (AV) FISTULA CREATION;  Surgeon: Rosetta Posner, MD;   Location: [facility];  Service: Vascular;  Laterality: Right;   BIOPSY  12/03/2021   Procedure: BIOPSY;  Surgeon: Doran Stabler, MD;  Location: [facility];  Service: Gastroenterology;;   ESOPHAGOGASTRODUODENOSCOPY (EGD) WITH PROPOFOL N/A 12/03/2021   Procedure: ESOPHAGOGASTRODUODENOSCOPY (EGD) WITH PROPOFOL;  Surgeon: Doran Stabler, MD;  Location: [facility];  Service: Gastroenterology;  Laterality: N/A;   EYE SURGERY     HEMATOMA EVACUATION Right 02/25/2021   Procedure: EVACUATION HEMATOMA RIGHT CHEST;  Surgeon: Waynetta Sandy, MD;  Location: [facility];  Service: Vascular;  Laterality: Right;   INSERTION OF DIALYSIS CATHETER Right 12/08/2020   Procedure: INSERTION OF TUNNELED DIALYSIS CATHETER;  Surgeon: [name], MD;  [facility];  Service: Vascular;  Laterality: Right;   IR REMOVAL TUN CV CATH W/O FL  05/03/2021   KIDNEY TRANSPLANT     NEPHRECTOMY TRANSPLANTED ORGAN     pancrease transplant     PERIPHERAL VASCULAR BALLOON ANGIOPLASTY Right 02/17/2021   Procedure: PERIPHERAL VASCULAR BALLOON ANGIOPLASTY;  Surgeon: [name], MD;  Location: [facility];  Service: Cardiovascular;  Laterality: Right;   REFRACTIVE SURGERY     REVISON OF ARTERIOVENOUS FISTULA Right 12/08/2020   Procedure: RIGHT ARM ARTERIOVENOUS FISTULA REVISION AND RESECTION;  Surgeon: [name], MD;  Location: [facility];  Service: Vascular;  Laterality: Right;   REVISON OF ARTERIOVENOUS FISTULA Right 02/25/2021   Procedure: RIGHT ARM ARTERIOVENOUS FISTULA REVISION WITH TRANSPOSITION OF  CEPHALIC VEIN ON AXILLARY VEIN;  Surgeon: [name], MD;  Location: [facility];  Service: Vascular;  Laterality: Right;   Social History:  reports that she has quit smoking. Her smoking use included pipe. She has never used smokeless tobacco. She reports that she does not drink alcohol and does not use drugs.  No Known Allergies  Family History  Problem Relation Age of Onset   Hypertension  Father     Prior to Admission medications   Medication Sig Start Date End Date Taking? Authorizing Provider  amLODipine (NORVASC) 10 MG tablet Take 1 tablet (10 mg total) by mouth daily. 12/05/21 03/05/22  [place] Eric J, DO  aspirin 81 MG chewable tablet Chew 81 mg by mouth daily.    [provider]  AURYXIA 1 GM 210 MG(Fe) tablet Take 630 mg by mouth 3 (three) times daily. Patient not taking: Reported on 01/08/2022 12/09/21   [provider]  calcium acetate, Phos Binder, (PHOSLYRA) 667 MG/5ML SOLN Take 2,668 mg by mouth 3 (three) times daily with meals. Take 3 tablets with meals and 1 tablet with snacks Patient not taking: Reported on 01/08/2022    [provider]  CATAPRES-TTS-1 0.1 MG/24HR patch Place 1 patch (0.1 mg total) onto the skin once a week. 12/04/21 03/04/22  [place] Eric J, DO  colchicine 0.6 MG tablet Take 0.6 mg by mouth as needed. Patient not taking: Reported on 01/08/2022    [provider]  cycloSPORINE modified (NEORAL) 100 MG capsule Take 2 capsules (200 mg total) by mouth 2 (two) times daily. 12/25/21   Pokhrel, Corrie Mckusick, MD  Darbepoetin Alfa (ARANESP) 60 MCG/0.3ML SOSY injection Inject 0.3 mLs (60 mcg total) into the vein every Friday with hemodialysis. 11/18/21   Cherene Altes, MD  diphenhydrAMINE (BENADRYL) 25 MG tablet Take 25 mg by mouth every 6 (six) hours as needed for itching. Patient not taking: Reported on 01/08/2022    [provider]  doxercalciferol (HECTOROL) 4 MCG/2ML injection Inject 3 mLs (6 mcg total) into the vein every Monday, Wednesday, and Friday with hemodialysis. 11/17/21   Cherene Altes, MD  ergocalciferol (VITAMIN D2) 1.25 MG (50000 UT) capsule Take 50,000 Units by mouth once a week.    [provider]  fluticasone (FLONASE) 50 MCG/ACT nasal spray Place 1 spray into both nostrils daily as needed for allergies. Patient not taking: Reported on 01/08/2022 11/24/21   [provider]  furosemide  (LASIX) 20 MG tablet Take 20 mg by mouth daily. Take on non-HD days    [provider]  lactulose (CHRONULAC) 10 GM/15ML solution Take 45 mLs by mouth daily. Patient not taking: Reported on 01/08/2022 11/18/21   [provider]  lidocaine-prilocaine (EMLA) cream Apply 1 application topically daily as needed (port access). 07/09/20   [provider]  losartan (COZAAR) 100 MG tablet Take 1 tablet (100 mg total) by mouth daily. 12/04/21 03/04/22  [place] Donnamarie Poag, DO  mycophenolate (MYFORTIC) 180 MG EC tablet Take 540 mg by mouth 2 (two) times daily.    [provider]  naloxone [facility]) nasal spray 4 mg/0.1 mL Place 1 spray into the nose as needed (opioid overdose). Patient not taking: Reported on 01/08/2022    [provider]  ondansetron (ZOFRAN) 4 MG tablet Take 4 mg by mouth daily as needed for nausea. Patient not taking: Reported on 01/08/2022 12/07/21   [provider]  oxyCODONE-acetaminophen (PERCOCET/ROXICET) 5-325 MG tablet Take 1-2 tablets by mouth every 6 (six) hours  as needed for severe pain. Patient not taking: Reported on 12/25/2021 03/22/21   [name], MD  predniSONE (DELTASONE) 5 MG tablet Take 15 mg by mouth daily. 06/24/15   [provider]  sodium zirconium cyclosilicate (LOKELMA) 10 g PACK packet Take 10 g by mouth as directed. Take on Non-Dialysis (Sun,Tues, Thurs, Sat)    [provider]    Physical Exam: Vitals:   01/30/22 0800 01/30/22 0810 01/30/22 0815 01/30/22 0830  BP: (!) 199/100 (!) 216/96 (!) 201/101 (!) 211/101  Pulse: 99 98 (!) 101 (!) 101  Resp: (!) '27 18 14 15  '$ Temp:      TempSrc:      SpO2: 96% 98% 95% 98%   General:  Appears calm and comfortable and is in NAD but emotionally labile, reporting pain and fear Eyes:  PERRL, EOMI, normal lids, iris ENT:  grossly normal hearing, lips & tongue, mmm; mostly absent dentition Neck:  no LAD, masses or thyromegaly Cardiovascular:  RR with mild  tachycardia, no m/r/g. No LE edema.  Respiratory:   CTA bilaterally with no wheezes/rales/rhonchi.  Normal respiratory effort. Abdomen:  soft, NT, ND (when distracted) Skin:  no rash or induration seen on limited exam Musculoskeletal:  grossly normal tone BUE/BLE, good ROM, no bony abnormality Psychiatric:  anxious/emotionally labile mood and affect, speech fluent and appropriate, AOx3 Neurologic:  CN 2-12 grossly intact, moves all extremities in coordinated fashion   Radiological Exams on Admission: Independently reviewed - see discussion in A/P where applicable  CT HEAD WO CONTRAST (5MM)  Result Date: 01/30/2022 CLINICAL DATA:  41 year old female with history of syncope. Head trauma. Dizziness. EXAM: CT HEAD WITHOUT CONTRAST TECHNIQUE: Contiguous axial images were obtained from the base of the skull through the vertex without intravenous contrast. RADIATION DOSE REDUCTION: This exam was performed according to the departmental dose-optimization program which includes automated exposure control, adjustment of the mA and/or kV according to patient size and/or use of iterative reconstruction technique. COMPARISON:  Head CT 01/10/2022. FINDINGS: Brain: Well-defined areas of low attenuation in the medial left cerebellar hemisphere and lateral left cerebellar hemisphere, similar in comparison to prior examinations, compatible with old chronic infarcts. Patchy areas of decreased attenuation are noted throughout the deep and periventricular white matter of the cerebral hemispheres bilaterally, compatible with mild chronic microvascular ischemic disease. No evidence of acute infarction, hemorrhage, hydrocephalus, extra-axial collection or mass lesion/mass effect. Vascular: No hyperdense vessel or unexpected calcification. Skull: Normal. Negative for fracture or focal lesion. Sinuses/Orbits: No acute finding. Other: None. IMPRESSION: 1. No acute intracranial abnormalities. 2. 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 Electronically Signed   By: Vinnie Langton M.D.   On: 01/30/2022 05:06   DG Chest Port 1 View  Result Date: 01/30/2022 CLINICAL DATA:  Chest pain. EXAM: PORTABLE CHEST 1 VIEW COMPARISON:  Chest x-ray 01/23/2022. FINDINGS: Heart is enlarged. There central pulmonary vascular congestion. There is no pleural effusion or pneumothorax. No acute fractures are seen. IMPRESSION: 1. Cardiomegaly with central pulmonary vascular congestion. Electronically Signed   By: Ronney Asters M.D.   On: 01/30/2022 03:28   CT Angio Chest/Abd/Pel for Dissection W and/or Wo Contrast  Result Date: 01/30/2022 CLINICAL DATA:  Chest or back pain, aortic dissection suspected. EXAM: CT ANGIOGRAPHY CHEST, ABDOMEN AND PELVIS TECHNIQUE: Non-contrast CT of the chest was initially obtained. Multidetector CT imaging through the chest, abdomen and pelvis was performed using the standard protocol  during bolus administration of intravenous contrast. Multiplanar reconstructed images and MIPs were obtained and reviewed to evaluate the vascular anatomy. RADIATION DOSE REDUCTION: This exam was performed according to the departmental dose-optimization program which includes automated exposure control, adjustment of the mA and/or kV according to patient size and/or use of iterative reconstruction technique. CONTRAST:  161m OMNIPAQUE IOHEXOL 350 MG/ML SOLN COMPARISON:  Abdominal CT 12/02/2021 FINDINGS: CTA CHEST FINDINGS Cardiovascular: Preferential opacification of the thoracic aorta. No evidence of thoracic aortic aneurysm or dissection. Enlarged heart size. Small volume low-density pericardial effusion. Extensive atheromatous calcification of the aorta and coronaries. Mediastinum/Nodes: Negative for adenopathy or mass. Coarse/benign-appearing calcification in the lateral right breast. Lungs/Pleura: Symmetric interlobular septal  thickening and dependent patchy ground-glass opacity. No pleural effusion or pneumothorax Musculoskeletal: Benign-appearing lucency in the T9 vertebral body. No acute or aggressive finding. Review of the MIP images confirms the above findings. CTA ABDOMEN AND PELVIS FINDINGS VASCULAR Aorta: Diffuse atheromatous calcification Celiac: Arterial calcification without stenosis or major branch occlusion SMA: Arterial calcification without proximal stenosis or major branch occlusion. Native renals: Extensive calcification. Bilateral stenosis which is difficult to quantify due to degree of calcified wall, likely flow limiting on both sides Transplant renal artery: Patent. IMA: Patent with extensive calcification Inflow: Diffuse atheromatous calcification. No stenosis or dissection. Veins: Unremarkable in the arterial phase. Pancreas transplant with anastomosis at the right common iliac artery, unremarkable. Review of the MIP images confirms the above findings. NON-VASCULAR Hepatobiliary: No focal liver abnormality.No evidence of biliary obstruction or stone. Pancreas: Native and transplant pancreas appears unremarkable Spleen: Chronic enlargement with 14 cm anterior to posterior span. No focal abnormality Adrenals/Urinary Tract: Negative adrenals. Symmetric native renal atrophy. Unchanged appearance of small transplant kidney in the right iliac fossa. No hydronephrosis. Unremarkable bladder. Stomach/Bowel: Loop of bowel adjacent to pancreas transplant that measures up to 3.6 cm in diameter. Dilatation of a lesser degree was seen on a 2020 CT. Moderate distention of the stomach which could be from a recent meal or gastroparesis. Diffuse colonic stool without colonic obstructive pattern. No visible bowel inflammation Lymphatic: No mass or adenopathy. Reproductive:Interval dominant follicle on the left Other: No ascites or pneumoperitoneum. Musculoskeletal: No acute abnormalities. Review of the MIP images confirms the above  findings. IMPRESSION: 1. Negative for acute aortic syndrome. 2. CHF. 3. Distended loop of small bowel adjacent to pancreas transplant, likely postoperative aneurysmal dilatation when compared with prior imaging. 4. Full stomach which may be from a recent meal or history of gastro paresis. 5. Chronic findings are described above. Electronically Signed   By: JJorje GuildM.D.   On: 01/30/2022 06:04    EKG: Independently reviewed.  NSR with rate 98; nonspecific ST changes with no evidence of acute ischemia   Labs on Admission: I have personally reviewed the available labs and imaging studies at the time of the admission.  Pertinent labs:    K+ 5.4 BUN 59/Creatinine 9.76/GFR 5 HS troponin 24, 26 Lactate 0.9 WBC 5.6 Hgb 11.4 - stable Platelets 126   Assessment and Plan: Principal Problem:   Hypertensive crisis Active Problems:   ESRD on dialysis ([place]   Pancreas transplant status ([place]   Type 1 diabetes ([place]   Narcotic dependence ([place]   Hypertensive crisis -Patient presenting with severe abdominal pain, chest pain and headache with markedly uncontrolled HTN, concerning for hypertensive crisis -Anticipate improvement with HD -She reports HD compliance and so may need a lower EDW -Will observe patient on telemetry  -Troponin minimally elevated with negative delta;  low suspicion for ACS -Will continue home meds - amlodipine, Catapres, losartan -Will add prn hydralazine (with fentanyl x 1 for now) -She has a h/o noncompliance but reports taking meds regularly and consistently attending HD   ESRD on dialysis [facility]) -Patient on chronic MWF HD -Vascular congestion seen on CXR -Nephrology prn order set utilized -She is volume overloaded and is likely to improve post-HD -Nephrology is aware of need for HD -Continue Auryxia (not taking previously), Phoslyra (not taking previously), Lokelma (non-HD days)   History of renal and pancreatic transplant -She has reported h/o  noncompliance with immunosuppressants and has failed renal transplant -Continue mycophenolate, cyclosporine, prednisone   Type 1 diabetes ([place]) -History of pancreatic transplant and not requiring medical treatment for diabetes -CBGs currently well controlled -Last A1c was 4.1    Narcotic dependence -Patient with chronic pain -I have reviewed this patient in the [place] Controlled Substances Reporting System.  She is receiving medications from multiple providers - concern for "doctor shopping" based on recent fill pattern -She is at increased risk of opioid misuse, diversion, or overdose.  -Opiate withdrawal may be contributing to current presentation.       Advance Care Planning:   Code Status: Full Code   Consults: Nephrology  DVT Prophylaxis: Heparin  Family Communication: None present; she declined having me call family at the time of admission  Severity of Illness: The appropriate patient status for this patient is OBSERVATION. Observation status is judged to be reasonable and necessary in order to provide the required intensity of service to ensure the patient's safety. The patient's presenting symptoms, physical exam findings, and initial radiographic and laboratory data in the context of their medical condition is felt to place them at decreased risk for further clinical deterioration. Furthermore, it is anticipated that the patient will be medically stable for discharge from the hospital within 2 midnights of admission.   Author: Karmen Bongo, MD 01/30/2022 9:14 AM  For on call review [email or web address].

## 2022-01-30 NOTE — Progress Notes (Signed)
Patient dialyzed with 1K bath for 60 mins then STAT K was drawn resulting to 2.7. DR.Schertz notified, said to keep dialyzing pt on 2K bath.

## 2022-01-30 NOTE — Procedures (Signed)
   I was present at this dialysis session, have reviewed the session itself and made  appropriate changes Kelly Splinter MD [place] pager [phone]   01/30/2022, 1:37 PM

## 2022-01-30 NOTE — ED Notes (Signed)
MD notified that pts BP still elevated after given PRN meds. Awaiting response

## 2022-01-30 NOTE — ED Triage Notes (Signed)
Patient BIB [facility] from home c/o "feeling her heart racing."  EMS reports patient told them she called her friend and then "passed out."  Patient then called EMS c/o chest pain/dizziness/abdominal pain/nausea/vomiting/diarrhea/anxiety.     216/110 92 HR CBG 142 99% RA

## 2022-01-31 DIAGNOSIS — D631 Anemia in chronic kidney disease: Secondary | ICD-10-CM | POA: Diagnosis present

## 2022-01-31 DIAGNOSIS — I160 Hypertensive urgency: Secondary | ICD-10-CM | POA: Diagnosis present

## 2022-01-31 DIAGNOSIS — E875 Hyperkalemia: Secondary | ICD-10-CM | POA: Diagnosis not present

## 2022-01-31 DIAGNOSIS — K3184 Gastroparesis: Secondary | ICD-10-CM | POA: Diagnosis present

## 2022-01-31 DIAGNOSIS — Z7982 Long term (current) use of aspirin: Secondary | ICD-10-CM | POA: Diagnosis not present

## 2022-01-31 DIAGNOSIS — R55 Syncope and collapse: Secondary | ICD-10-CM | POA: Diagnosis present

## 2022-01-31 DIAGNOSIS — F1120 Opioid dependence, uncomplicated: Secondary | ICD-10-CM | POA: Diagnosis present

## 2022-01-31 DIAGNOSIS — Z87891 Personal history of nicotine dependence: Secondary | ICD-10-CM | POA: Diagnosis not present

## 2022-01-31 DIAGNOSIS — N186 End stage renal disease: Secondary | ICD-10-CM | POA: Diagnosis present

## 2022-01-31 DIAGNOSIS — I169 Hypertensive crisis, unspecified: Secondary | ICD-10-CM | POA: Diagnosis present

## 2022-01-31 DIAGNOSIS — I161 Hypertensive emergency: Secondary | ICD-10-CM | POA: Diagnosis present

## 2022-01-31 DIAGNOSIS — I120 Hypertensive chronic kidney disease with stage 5 chronic kidney disease or end stage renal disease: Secondary | ICD-10-CM | POA: Diagnosis present

## 2022-01-31 DIAGNOSIS — Z79620 Long term (current) use of immunosuppressive biologic: Secondary | ICD-10-CM | POA: Diagnosis not present

## 2022-01-31 DIAGNOSIS — K219 Gastro-esophageal reflux disease without esophagitis: Secondary | ICD-10-CM | POA: Diagnosis not present

## 2022-01-31 DIAGNOSIS — Z91148 Patient's other noncompliance with medication regimen for other reason: Secondary | ICD-10-CM | POA: Diagnosis not present

## 2022-01-31 DIAGNOSIS — Z9483 Pancreas transplant status: Secondary | ICD-10-CM | POA: Diagnosis not present

## 2022-01-31 DIAGNOSIS — Z992 Dependence on renal dialysis: Secondary | ICD-10-CM | POA: Diagnosis not present

## 2022-01-31 DIAGNOSIS — R0902 Hypoxemia: Secondary | ICD-10-CM | POA: Diagnosis present

## 2022-01-31 DIAGNOSIS — T8612 Kidney transplant failure: Secondary | ICD-10-CM | POA: Diagnosis present

## 2022-01-31 DIAGNOSIS — M898X9 Other specified disorders of bone, unspecified site: Secondary | ICD-10-CM | POA: Diagnosis present

## 2022-01-31 DIAGNOSIS — Z8249 Family history of ischemic heart disease and other diseases of the circulatory system: Secondary | ICD-10-CM | POA: Diagnosis not present

## 2022-01-31 DIAGNOSIS — F419 Anxiety disorder, unspecified: Secondary | ICD-10-CM | POA: Diagnosis not present

## 2022-01-31 DIAGNOSIS — E8770 Fluid overload, unspecified: Secondary | ICD-10-CM | POA: Diagnosis not present

## 2022-01-31 DIAGNOSIS — G894 Chronic pain syndrome: Secondary | ICD-10-CM | POA: Diagnosis present

## 2022-01-31 DIAGNOSIS — E1043 Type 1 diabetes mellitus with diabetic autonomic (poly)neuropathy: Secondary | ICD-10-CM | POA: Diagnosis present

## 2022-01-31 LAB — BASIC METABOLIC PANEL
Anion gap: 12 (ref 5–15)
BUN: 48 mg/dL — ABNORMAL HIGH (ref 6–20)
CO2: 30 mmol/L (ref 22–32)
Calcium: 9.1 mg/dL (ref 8.9–10.3)
Chloride: 98 mmol/L (ref 98–111)
Creatinine, Ser: 7.6 mg/dL — ABNORMAL HIGH (ref 0.44–1.00)
GFR, Estimated: 6 mL/min — ABNORMAL LOW (ref >60)
Glucose, Bld: 115 mg/dL — ABNORMAL HIGH (ref 70–99)
Potassium: 4.7 mmol/L (ref 3.5–5.1)
Sodium: 140 mmol/L (ref 135–145)

## 2022-01-31 LAB — PHOSPHORUS: Phosphorus: 7.4 mg/dL — ABNORMAL HIGH (ref 2.5–4.6)

## 2022-01-31 MED ORDER — SODIUM CHLORIDE 0.9 % IV SOLN
12.5000 mg | Freq: Three times a day (TID) | INTRAVENOUS | Status: AC | PRN
  Administered 2022-01-31 – 2022-02-01 (×2): 12.5 mg via INTRAVENOUS
  Filled 2022-01-31 (×2): qty 0.5

## 2022-01-31 MED ORDER — HYDRALAZINE HCL 50 MG PO TABS
50.0000 mg | ORAL_TABLET | Freq: Three times a day (TID) | ORAL | Status: DC
  Administered 2022-01-31 – 2022-02-01 (×3): 50 mg via ORAL
  Filled 2022-01-31 (×3): qty 1

## 2022-01-31 MED ORDER — HYDROMORPHONE HCL 1 MG/ML IJ SOLN
0.5000 mg | INTRAMUSCULAR | Status: AC | PRN
  Administered 2022-01-31: 0.5 mg via INTRAVENOUS
  Filled 2022-01-31: qty 0.5

## 2022-01-31 MED ORDER — HYDROMORPHONE HCL 1 MG/ML IJ SOLN
0.5000 mg | INTRAMUSCULAR | Status: DC | PRN
  Administered 2022-01-31: 0.5 mg via INTRAVENOUS
  Filled 2022-01-31: qty 0.5

## 2022-01-31 MED ORDER — HYDROMORPHONE HCL 1 MG/ML IJ SOLN
0.2500 mg | Freq: Once | INTRAMUSCULAR | Status: AC
  Administered 2022-01-31: 0.25 mg via INTRAVENOUS
  Filled 2022-01-31: qty 0.5

## 2022-01-31 NOTE — TOC Initial Note (Signed)
Transition of Care [facility]) - Initial/Assessment Note    Patient Details  Name: [name] [name] MRN: [id number] Date of Birth: 10-28-1980  [facility] CM/SW Contact:    Verdell Carmine, RN Phone Number: 01/31/2022, 12:26 PM  Clinical Narrative:                  The Transition of Care Department [facility]) has reviewed patient and no TOC needs have been identified at this time. We will continue to monitor patient advancement through interdisciplinary progression rounds. If new patient transition needs arise, please place a TOC consult         Patient Goals and CMS Choice        Expected Discharge Plan and Services                                                Prior Living Arrangements/Services                       Activities of Daily Living      Permission Sought/Granted                  Emotional Assessment              Admission diagnosis:  Generalized abdominal pain [R10.84] Hypoxia [R09.02] Hypertensive urgency [I16.0] Abdominal pain [R10.9] Chest pain, unspecified type [R07.9] Patient Active Problem List   Diagnosis Date Noted   Hypertensive crisis 01/30/2022   Hypervolemia associated with renal insufficiency 12/23/2021   Hemorrhagic gastritis    Subdural hematoma ([place]) 12/02/2021   Acute blood loss anemia 12/02/2021   Abdominal pain 12/02/2021   Thrombocytopenia ([place]) 12/02/2021   Abnormal finding on CT scan 12/02/2021   Type 1 diabetes ([place]) 12/02/2021   Fever 11/07/2021   Acute encephalopathy 11/06/2021   Pulmonary edema 11/06/2021   Hypertensive urgency 11/06/2021   Type 2 diabetes mellitus with peripheral neuropathy ([place]) 11/06/2021   History of renal transplant 11/06/2021   COVID-19 virus infection 11/06/2021   HTN (hypertension), malignant 08/02/2021   ESRD on dialysis ([place]) 02/25/2021   Coagulation defect, unspecified ([place]) 12/22/2020   Acute cough 10/04/2020   Allergy, unspecified, initial  encounter 05/21/2020   Anaphylactic shock, unspecified, initial encounter 05/21/2020   Symptomatic anemia 02/27/2020   Shortness of breath 04/23/2019   Hypocalcemia 12/30/2018   Aortic atherosclerosis ([place]) 12/27/2018   Acute respiratory failure with hypoxia ([place]) 12/22/2018   Hyperkalemia    Bilateral pneumonia 12/21/2018   Pancreas transplant status ([place]) 12/21/2018   Immunosuppression ([place]) 12/21/2018   Chest pain 12/21/2018   CAP (community acquired pneumonia) 12/20/2018   Other staphylococcus as the cause of diseases classified elsewhere 08/16/2018   Mild protein-calorie malnutrition ([place]) 04/18/2018   Encounter for screening for respiratory tuberculosis 04/06/2018   Gout 04/06/2018   Iron deficiency anemia, unspecified 04/06/2018   Leiomyoma of uterus, unspecified 04/06/2018   Secondary hyperparathyroidism of renal origin ([place]) 04/06/2018   Acute rejection of kidney transplant 07/11/2017   Elevated serum creatinine 06/27/2017   E. coli UTI 12/18/2014   Transplant rejection 09/03/2014   Cyst of ovary, left 09/01/2014   Immunosuppressive management encounter following kidney transplant 08/26/2014   Acne 04/28/2014   Itch of skin 04/28/2014   Diabetic gastroparesis associated with type 1 diabetes mellitus ([place]) 11/07/2013   Nausea & vomiting 11/07/2013  Lesion of spleen 25/18/9842   Metabolic acidosis 07/04/2810   Patient's noncompliance with other medical treatment and regimen 09/12/2013   Narcotic dependence ([place]) 08/31/2013   Generalized pain 08/30/2013   Hypomagnesemia 08/30/2013   Renal hematoma of right transplanted kidney 08/21/2013   Hypophosphatemia 07/28/2013   End stage renal disease ([place]) 02/06/2012   Pleural effusion, bilateral 11/16/2011   Anemia in chronic kidney disease (CKD) 11/16/2011   Pericardial effusion 11/15/2011   Fluid overload 11/15/2011   Abdominal pain, epigastric 11/15/2011   CKD (chronic kidney disease) stage 5, GFR less than 15 ml/min  ([place]) 11/03/2011   Hypertension 10/27/2011   Renal failure, chronic 10/27/2011   MEDIAL EPICONDYLITIS, LEFT 12/17/2007   ABSCESS, AXILLA, LEFT 12/03/2007   Type 2 diabetes mellitus with other diabetic kidney complication ([place]) 88/67/7373   PCP:  [facility] [place]:   [id number] [place] [id number]: [phone]: [phone]    Social Determinants of Health (SDOH) Interventions    Readmission Risk Interventions    11/17/2021   12:15 PM  Readmission Risk Prevention Plan  Transportation Screening Complete  Medication Review ([place] Complete  PCP or Specialist appointment within 3-5 days of discharge Complete  HRI or [place]  SW Recovery Care/Counseling Consult Complete  Palliative Care Screening Not AG[place]perPatient Refused

## 2022-01-31 NOTE — Progress Notes (Addendum)
[facility] Progress Note  Subjective: seen in room, BP's better 177/ 92 (was 224/104 in ED). Pt still having HA. CP a bit better.   Vitals:   01/31/22 1230 01/31/22 1300 01/31/22 1400 01/31/22 1533  BP: (!) 174/87 (!) 179/96 (!) 195/91 (!) 171/81  Pulse: 95 93 100 99  Resp: '18 19 19 14  '$ Temp: 98.3 F (36.8 C)   98.4 F (36.9 C)  TempSrc: Oral   Oral  SpO2: 99% 95%  96%  Weight:      Height:        Exam: Gen alert No jvd or bruits Chest clear bilat to bases RRR no RG Abd soft ntnd no mass or ascites +bs Ext no LE edema Neuro is alert, Ox 3 , nf  RUA AVF+bruit         Home meds include - norvasc 10, asa, auryxia 3 ac, phoslyra 3 ac, catapres patch 0.1 weekly, cyclosporine 200 bid, ergocalciferol, lasix 20 qd, losartan 100, mycophenolate 540 bid, narcan prn, percocet, prednisone 15 qd, lokelma 10 gm prn     CXR - bilat vasc congestion, early bilat IS edema      OP HD: MWF AF  3h 37mn  53.5kg  400/500   2/2 bath  RUA AVF  Hep none - last HD 5/26, 57.6 > 54.4kg - last Hb 10.6 on 5/24 - mircera 200 q2, last 5/24, due 6/07 - hectorol 8 ug tiw IV - sensipar 90 po tiw   Assessment/ Plan: HTN'sive crisis - w/ abd pain, chest pains, headache. 3300 cc UF w/ HD yesterday here. BP's better today. Cont to lower vol w/ HD tomorrow.  ESRD - on HD MWF. Has not missed HD. HD tomorrow.  Volume - is 2kg up today and large UF w/ HD yesterday. Try to lower edw if possible w/ next HD.  Hx renal / pancreas transplant -  pancreas still working > on cyclosporine, mycophenolate and prednisone Type 1 DM - sp panc transplant as above Narcotic dependence - per pmd Anemia ckd - Hb 11.4, no esa needs MBD ckd - CCa in range. Phos a bit high. Cont binders x 2 and IV vdra     [name] [name] 01/31/2022, 4:03 PM   Recent Labs  Lab 01/30/22 0337 01/30/22 1321 01/31/22 0829 01/31/22 0921  HGB 11.4*  --   --   --   ALBUMIN 3.4*  --   --   --   CALCIUM 9.5  --  9.1  --   PHOS   --   --   --  7.4*  CREATININE 9.76*  --  7.60*  --   K 5.4* 2.7* 4.7  --    Inpatient medications:  amLODipine  10 mg Oral Daily   aspirin  81 mg Oral Daily   calcium acetate  2,668 mg Oral TID WC   Chlorhexidine Gluconate Cloth  6 each Topical Q0600   cloNIDine  0.1 mg Transdermal Weekly   cycloSPORINE modified  200 mg Oral BID   doxercalciferol  6 mcg Intravenous Q M,W,F-HD   ferric citrate  630 mg Oral TID   heparin  5,000 Units Subcutaneous Q8H   hydrALAZINE  50 mg Oral Q8H   losartan  100 mg Oral Daily   mycophenolate  540 mg Oral BID   predniSONE  15 mg Oral Daily   sodium chloride flush  3 mL Intravenous Q12H    promethazine (PHENERGAN) injection (IM or IVPB)  acetaminophen **OR** acetaminophen, albuterol, calcium carbonate (dosed in mg elemental calcium), camphor-menthol **AND** hydrOXYzine, docusate sodium, feeding supplement (NEPRO CARB STEADY), hydrALAZINE, HYDROmorphone (DILAUDID) injection, labetalol, ondansetron **OR** ondansetron (ZOFRAN) IV, promethazine (PHENERGAN) injection (IM or IVPB), sorbitol, zolpidem

## 2022-01-31 NOTE — Progress Notes (Signed)
PROGRESS NOTE    [name] [name]  VHQ:[id number] DOB: [birth year] DOA: 01/30/2022 PCP: [facility]   Brief Narrative:  HPI: [name] [name] is a 41 y.o. 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  She is having abdominal pain and headache.  Symptoms started yesterday.  She has not missed HD, last HD was Friday.  Her BP was really high all day yesterday.  She has not missed any BP medications.  It has been running high for a whole week.  No improvement after HD last week.  +n/v/d.   ER Course:  ESRD, prior transplants.  Severe abdominal pain.  Went to [facility] on 5/22, unremarkable evaluation and went home.  No contrast allergy so CTA dissection study done, negative for dissection.  Severe HTN, n/v, abdominal pain, hypoxia.  CXR with CHF.  ?renal related.  Needs BP and symptom control.    Assessment & Plan:   Principal Problem:   Hypertensive crisis Active Problems:   ESRD on dialysis [facility])   Pancreas transplant status ([place])   Type 1 diabetes ([place])   Narcotic dependence ([place])   Hypertensive emergency  Hypertensive crisis -Patient presenting with severe abdominal pain, chest pain and headache with markedly uncontrolled HTN, concerning for hypertensive crisis.  CT chest abdomen pelvis was negative for any dissection or any other acute intra-abdominal or pelvic pathology.  Patient received her dialysis and she claims that she is compliant with her medications which have been resumed as well, patient's blood pressure has improved significantly from what she presented with but she continues to complain of pain.  She tells me that she was given Dilaudid in the ED which helped.  I will place her on as needed Dilaudid.  On sure why this patient is on clonidine patch and not on any  other first-line antihypertensive such as hydralazine.  Will start her on hydralazine 50 mg 3 times daily.  Continue amlodipine, clonidine patch, losartan 100 mg.    ESRD on dialysis [facility]) -Patient on chronic MWF HD -Vascular congestion seen on CXR.  Nephrology consulted, she received dialysis last night.   History of renal and pancreatic transplant -She has reported h/o noncompliance with immunosuppressants and has failed renal transplant -Continue mycophenolate, cyclosporine, prednisone   Type 1 diabetes ([place]) -History of pancreatic transplant and not requiring medical treatment for diabetes -CBGs currently well controlled -Last A1c was 4.1    Narcotic dependence -Patient with chronic pain -I have reviewed this patient in the [place] Controlled Substances Reporting System.  She is receiving medications from multiple providers recently but previously she was receiving medication from one provider.  There was some concern about her doctor shopping and drug-seeking behavior, which could explain her pain that she is complaining.  With limited opioid medications.  DVT prophylaxis: heparin injection 5,000 Units Start: 01/30/22 0915   Code Status: Full Code  Family Communication:  None present at bedside.  Plan of care discussed with patient in length and he/she verbalized understanding and agreed with it.  Status is: Inpatient Remains inpatient appropriate because: Needs to better control blood pressure.   Estimated body mass index is 21 kg/m as calculated from the following:   Height as of this encounter: '5\' 4"'$  (1.626 m).   Weight as of this encounter: 55.5 kg.    Nutritional Assessment: Body mass index is 21  kg/m.. Seen by dietician.  I agree with the assessment and plan as outlined below: Nutrition Status:        . Skin Assessment: I have examined the patient's skin and I agree with the wound assessment as performed by the wound care RN as outlined below:    Consultants:   Nephrology  Procedures:  None  Antimicrobials:  Anti-infectives (From admission, onward)    None         Subjective: Seen and examined.  Still complains of same amount of abdominal, chest as well as headache but she clinically appears to be very comfortable.  No other complaint.  Objective: Vitals:   01/31/22 0840 01/31/22 1107 01/31/22 1141 01/31/22 1230  BP: (!) 194/89 (!) 188/89 (!) 188/93 (!) 174/87  Pulse: 90 93 89 95  Resp: '20 20 17 18  '$ Temp: 97.9 F (36.6 C) 98.2 F (36.8 C)  98.3 F (36.8 C)  TempSrc: Oral Oral  Oral  SpO2: 97% 92% 95% 99%  Weight:      Height:        Intake/Output Summary (Last 24 hours) at 01/31/2022 1345 Last data filed at 01/31/2022 0930 Gross per 24 hour  Intake 480 ml  Output 3300 ml  Net -2820 ml   Filed Weights   01/30/22 0952 01/30/22 1205 01/30/22 1545  Weight: 58.3 kg 58.5 kg 55.5 kg    Examination:  General exam: Appears calm and comfortable  Respiratory system: Clear to auscultation. Respiratory effort normal. Cardiovascular system: S1 & S2 heard, RRR. No JVD, murmurs, rubs, gallops or clicks. No pedal edema. Gastrointestinal system: Abdomen is nondistended, soft and nontender. No organomegaly or masses felt. Normal bowel sounds heard. Central nervous system: Alert and oriented. No focal neurological deficits. Extremities: Symmetric 5 x 5 power. Skin: No rashes, lesions or ulcers Psychiatry: Judgement and insight appear normal. Mood & affect appropriate.    Data Reviewed: I have personally reviewed following labs and imaging studies  CBC: Recent Labs  Lab 01/30/22 0337  WBC 5.6  HGB 11.4*  HCT 36.6  MCV 102.2*  PLT 027*   Basic Metabolic Panel: Recent Labs  Lab 01/30/22 0337 01/30/22 1321 01/31/22 0829 01/31/22 0921  NA 140  --  140  --   K 5.4* 2.7* 4.7  --   CL 101  --  98  --   CO2 25  --  30  --   GLUCOSE 104*  --  115*  --   BUN 59*  --  48*  --   CREATININE 9.76*  --  7.60*  --   CALCIUM 9.5   --  9.1  --   PHOS  --   --   --  7.4*   GFR: Estimated Creatinine Clearance: 8.5 mL/min (A) (by C-G formula based on SCr of 7.6 mg/dL (H)). Liver Function Tests: Recent Labs  Lab 01/30/22 0337  AST 10*  ALT 12  ALKPHOS 100  BILITOT 0.7  PROT 6.7  ALBUMIN 3.4*   Recent Labs  Lab 01/30/22 0337  LIPASE 39   No results for input(s): AMMONIA in the last 168 hours. Coagulation Profile: No results for input(s): INR, PROTIME in the last 168 hours. Cardiac Enzymes: No results for input(s): CKTOTAL, CKMB, CKMBINDEX, TROPONINI in the last 168 hours. BNP (last 3 results) No results for input(s): PROBNP in the last 8760 hours. HbA1C: No results for input(s): HGBA1C in the last 72 hours. CBG: No results for input(s): GLUCAP in the last 168 hours.  Lipid Profile: No results for input(s): CHOL, HDL, LDLCALC, TRIG, CHOLHDL, LDLDIRECT in the last 72 hours. Thyroid Function Tests: No results for input(s): TSH, T4TOTAL, FREET4, T3FREE, THYROIDAB in the last 72 hours. Anemia Panel: No results for input(s): VITAMINB12, FOLATE, FERRITIN, TIBC, IRON, RETICCTPCT in the last 72 hours. Sepsis Labs: Recent Labs  Lab 01/30/22 4944  LATICACIDVEN 0.9    No results found for this or any previous visit (from the past 240 hour(s)).   Radiology Studies: DG Abd 1 View  Result Date: 01/30/2022 CLINICAL DATA:  Abdominal pain, nausea EXAM: ABDOMEN - 1 VIEW COMPARISON:  11/07/2021 FINDINGS: Supine frontal view of the abdomen and pelvis demonstrates an unremarkable bowel gas pattern. Vascular calcifications are seen throughout the abdomen and pelvis. No masses or abnormal calcifications. No acute bony abnormalities. IMPRESSION: 1. Unremarkable bowel gas pattern. Electronically Signed   By: Randa Ngo M.D.   On: 01/30/2022 22:51   CT HEAD WO CONTRAST (5MM)  Result Date: 01/30/2022 CLINICAL DATA:  41 year old female with history of syncope. Head trauma. Dizziness. EXAM: CT HEAD WITHOUT CONTRAST  TECHNIQUE: Contiguous axial images were obtained from the base of the skull through the vertex without intravenous contrast. RADIATION DOSE REDUCTION: This exam was performed according to the departmental dose-optimization program which includes automated exposure control, adjustment of the mA and/or kV according to patient size and/or use of iterative reconstruction technique. COMPARISON:  Head CT 01/10/2022. FINDINGS: Brain: Well-defined areas of low attenuation in the medial left cerebellar hemisphere and lateral left cerebellar hemisphere, similar in comparison to prior examinations, compatible with old chronic infarcts. Patchy areas of decreased attenuation are noted throughout the deep and periventricular white matter of the cerebral hemispheres bilaterally, compatible with mild chronic microvascular ischemic disease. No evidence of acute infarction, hemorrhage, hydrocephalus, extra-axial collection or mass lesion/mass effect. Vascular: No hyperdense vessel or unexpected calcification. Skull: Normal. Negative for fracture or focal lesion. Sinuses/Orbits: No acute finding. Other: None. IMPRESSION: 1. No acute intracranial abnormalities. 2. 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 Electronically Signed   By: Vinnie Langton M.D.   On: 01/30/2022 05:06   DG Chest Port 1 View  Result Date: 01/30/2022 CLINICAL DATA:  Chest pain. EXAM: PORTABLE CHEST 1 VIEW COMPARISON:  Chest x-ray 01/23/2022. FINDINGS: Heart is enlarged. There central pulmonary vascular congestion. There is no pleural effusion or pneumothorax. No acute fractures are seen. IMPRESSION: 1. Cardiomegaly with central pulmonary vascular congestion. Electronically Signed   By: Ronney Asters M.D.   On: 01/30/2022 03:28   CT Angio Chest/Abd/Pel for Dissection W and/or Wo Contrast  Result Date: 01/30/2022 CLINICAL DATA:  Chest or back pain,  aortic dissection suspected. EXAM: CT ANGIOGRAPHY CHEST, ABDOMEN AND PELVIS TECHNIQUE: Non-contrast CT of the chest was initially obtained. Multidetector CT imaging through the chest, abdomen and pelvis was performed using the standard protocol during bolus administration of intravenous contrast. Multiplanar reconstructed images and MIPs were obtained and reviewed to evaluate the vascular anatomy. RADIATION DOSE REDUCTION: This exam was performed according to the departmental dose-optimization program which includes automated exposure control, adjustment of the mA and/or kV according to patient size and/or use of iterative reconstruction technique. CONTRAST:  127m OMNIPAQUE IOHEXOL 350 MG/ML SOLN COMPARISON:  Abdominal CT 12/02/2021 FINDINGS: CTA CHEST FINDINGS Cardiovascular: Preferential opacification of the thoracic aorta. No evidence of thoracic aortic aneurysm or dissection. Enlarged heart size. Small volume low-density pericardial effusion. Extensive atheromatous calcification of the aorta  and coronaries. Mediastinum/Nodes: Negative for adenopathy or mass. Coarse/benign-appearing calcification in the lateral right breast. Lungs/Pleura: Symmetric interlobular septal thickening and dependent patchy ground-glass opacity. No pleural effusion or pneumothorax Musculoskeletal: Benign-appearing lucency in the T9 vertebral body. No acute or aggressive finding. Review of the MIP images confirms the above findings. CTA ABDOMEN AND PELVIS FINDINGS VASCULAR Aorta: Diffuse atheromatous calcification Celiac: Arterial calcification without stenosis or major branch occlusion SMA: Arterial calcification without proximal stenosis or major branch occlusion. Native renals: Extensive calcification. Bilateral stenosis which is difficult to quantify due to degree of calcified wall, likely flow limiting on both sides Transplant renal artery: Patent. IMA: Patent with extensive calcification Inflow: Diffuse atheromatous calcification.  No stenosis or dissection. Veins: Unremarkable in the arterial phase. Pancreas transplant with anastomosis at the right common iliac artery, unremarkable. Review of the MIP images confirms the above findings. NON-VASCULAR Hepatobiliary: No focal liver abnormality.No evidence of biliary obstruction or stone. Pancreas: Native and transplant pancreas appears unremarkable Spleen: Chronic enlargement with 14 cm anterior to posterior span. No focal abnormality Adrenals/Urinary Tract: Negative adrenals. Symmetric native renal atrophy. Unchanged appearance of small transplant kidney in the right iliac fossa. No hydronephrosis. Unremarkable bladder. Stomach/Bowel: Loop of bowel adjacent to pancreas transplant that measures up to 3.6 cm in diameter. Dilatation of a lesser degree was seen on a 2020 CT. Moderate distention of the stomach which could be from a recent meal or gastroparesis. Diffuse colonic stool without colonic obstructive pattern. No visible bowel inflammation Lymphatic: No mass or adenopathy. Reproductive:Interval dominant follicle on the left Other: No ascites or pneumoperitoneum. Musculoskeletal: No acute abnormalities. Review of the MIP images confirms the above findings. IMPRESSION: 1. Negative for acute aortic syndrome. 2. CHF. 3. Distended loop of small bowel adjacent to pancreas transplant, likely postoperative aneurysmal dilatation when compared with prior imaging. 4. Full stomach which may be from a recent meal or history of gastro paresis. 5. Chronic findings are described above. Electronically Signed   By: Jorje Guild M.D.   On: 01/30/2022 06:04    Scheduled Meds:  amLODipine  10 mg Oral Daily   aspirin  81 mg Oral Daily   calcium acetate  2,668 mg Oral TID WC   Chlorhexidine Gluconate Cloth  6 each Topical Q0600   cloNIDine  0.1 mg Transdermal Weekly   cycloSPORINE modified  200 mg Oral BID   doxercalciferol  6 mcg Intravenous Q M,W,F-HD   ferric citrate  630 mg Oral TID   heparin   5,000 Units Subcutaneous Q8H   losartan  100 mg Oral Daily   mycophenolate  540 mg Oral BID   predniSONE  15 mg Oral Daily   sodium chloride flush  3 mL Intravenous Q12H   Continuous Infusions:   LOS: 0 days   Darliss Cheney, MD [name]  01/31/2022, 1:45 PM   *Please note that this is a verbal dictation therefore any spelling or grammatical errors are due to the "[place] [employer]" system interpretation.  Please page via [place] and do not message via secure chat for urgent patient care matters. Secure chat can be used for non urgent patient care matters.  How to contact the [facility] Attending or Consulting provider [place] or covering provider during after hours [place], for this patient?  Check the care team in [facility] and look for a) attending/consulting [facility] provider listed and b) the [facility] team listed. Page or secure chat [phone]. Log into [email or web address] and use [place]'s universal password to access. If  you do not have the password, please contact the hospital operator. Locate the [facility] provider you are looking for under [name] and page to a number that you can be directly reached. If you still have difficulty reaching the provider, please page the [facility] (Director on Call) for the Hospitalists listed on amion for assistance.

## 2022-02-01 DIAGNOSIS — I169 Hypertensive crisis, unspecified: Secondary | ICD-10-CM | POA: Diagnosis not present

## 2022-02-01 MED ORDER — HYDRALAZINE HCL 50 MG PO TABS
75.0000 mg | ORAL_TABLET | Freq: Three times a day (TID) | ORAL | Status: DC
  Administered 2022-02-01 – 2022-02-05 (×11): 75 mg via ORAL
  Filled 2022-02-01 (×11): qty 1

## 2022-02-01 MED ORDER — HYDROMORPHONE HCL 1 MG/ML IJ SOLN
0.5000 mg | INTRAMUSCULAR | Status: AC | PRN
  Administered 2022-02-01: 0.5 mg via INTRAVENOUS
  Filled 2022-02-01: qty 0.5

## 2022-02-01 MED ORDER — HYDROMORPHONE HCL 1 MG/ML IJ SOLN
0.5000 mg | INTRAMUSCULAR | Status: AC | PRN
  Administered 2022-02-02 (×2): 0.5 mg via INTRAVENOUS
  Filled 2022-02-01 (×2): qty 0.5

## 2022-02-01 MED ORDER — LORAZEPAM 2 MG/ML IJ SOLN: 1.0000 mg | Freq: Once | INTRAMUSCULAR | Status: DC

## 2022-02-01 MED ORDER — DOXERCALCIFEROL 4 MCG/2ML IV SOLN
INTRAVENOUS | Status: AC
  Filled 2022-02-01: qty 2

## 2022-02-01 MED ORDER — OXYCODONE-ACETAMINOPHEN 5-325 MG PO TABS
1.0000 | ORAL_TABLET | Freq: Four times a day (QID) | ORAL | Status: DC
  Administered 2022-02-01 – 2022-02-03 (×6): 1 via ORAL
  Filled 2022-02-01 (×6): qty 1

## 2022-02-01 MED ORDER — CLONIDINE HCL 0.2 MG/24HR TD PTWK
0.2000 mg | MEDICATED_PATCH | TRANSDERMAL | Status: DC
  Administered 2022-02-01: 0.2 mg via TRANSDERMAL
  Filled 2022-02-01: qty 1

## 2022-02-01 MED ORDER — LORAZEPAM 2 MG/ML IJ SOLN
1.0000 mg | Freq: Once | INTRAMUSCULAR | Status: AC
  Administered 2022-02-01: 2 mg via INTRAVENOUS
  Filled 2022-02-01: qty 1

## 2022-02-01 NOTE — Progress Notes (Signed)
Received patient in bed, alert and oriented. Informed consent signed and in chart.  Time tx initiated:  Pre HD weight:  Pre HD VS:  Time tx completed:  HD treatment completed. Patient tolerated well. Fistula/Graft/HD catheter without signs and symptoms of complications. Patient transported back to the room, alert and orient and in no acute distress. Report given to bedside RN.  Total UF removed:1380  Medication given:Ativan, Hectorol  Post HD VS: 199/97, 84,20 Post HD weight: 57.1

## 2022-02-01 NOTE — Progress Notes (Signed)
[facility] Progress Note  Subjective: seen in room, BP's better 177/ 92 (was 224/104 in ED). Pt still having HA. CP a bit better.   Vitals:   02/01/22 0400 02/01/22 0544 02/01/22 0730 02/01/22 0811  BP: (!) 194/97 (!) 175/80 (!) 183/86 (!) 187/90  Pulse: 97  (!) 106 100  Resp: '15 17 20 19  '$ Temp: 98.2 F (36.8 C)  98.1 F (36.7 C)   TempSrc: Oral  Oral   SpO2: 97%  98%   Weight: 57.1 kg     Height:        Exam: Gen alert No jvd or bruits Chest clear bilat to bases RRR no RG Abd soft ntnd no mass or ascites +bs Ext no LE edema Neuro is alert, Ox 3 , nf  RUA AVF+bruit         Home meds include - norvasc 10, asa, auryxia 3 ac, phoslyra 3 ac, catapres patch 0.1 weekly, cyclosporine 200 bid, ergocalciferol, lasix 20 qd, losartan 100, mycophenolate 540 bid, narcan prn, percocet, prednisone 15 qd, lokelma 10 gm prn     CXR - bilat vasc congestion, early bilat IS edema      OP HD: MWF AF  3h 68mn  53.5kg  400/500   2/2 bath  RUA AVF  Hep none - last HD 5/26, 57.6 > 54.4kg - last Hb 10.6 on 5/24 - mircera 200 q2, last 5/24, due 6/07 - hectorol 8 ug tiw IV - sensipar 90 po tiw   Assessment/ Plan: HTN'sive crisis - w/ abd pain, chest pains, headache.  SBP's still remain high, her usual is SBP 140- 160. Will ^catapres patch to 0.2 and ^hydralazine (new med) to 75 tid.  ESRD - on HD MWF. Has not missed HD. HD today Volume - is up ~4kg. Did not tolerate large UF w/ last HD, goal 2.5 L today.  Hx renal / pancreas transplant -  pancreas still working > on cyclosporine, mycophenolate and prednisone Type 1 DM - sp panc transplant as above Narcotic dependence - per pmd Anemia ckd - Hb 11.4, no esa needs MBD ckd - CCa in range. Phos a bit high. Cont binders x 2 and IV vdra     Rob Rin Gorton 02/01/2022, 8:44 AM   Recent Labs  Lab 01/30/22 0337 01/30/22 1321 01/31/22 0829 01/31/22 0921  HGB 11.4*  --   --   --   ALBUMIN 3.4*  --   --   --   CALCIUM 9.5  --   9.1  --   PHOS  --   --   --  7.4*  CREATININE 9.76*  --  7.60*  --   K 5.4* 2.7* 4.7  --     Inpatient medications:  amLODipine  10 mg Oral Daily   aspirin  81 mg Oral Daily   calcium acetate  2,668 mg Oral TID WC   Chlorhexidine Gluconate Cloth  6 each Topical Q0600   cloNIDine  0.1 mg Transdermal Weekly   cycloSPORINE modified  200 mg Oral BID   doxercalciferol  6 mcg Intravenous Q M,W,F-HD   ferric citrate  630 mg Oral TID   heparin  5,000 Units Subcutaneous Q8H   hydrALAZINE  50 mg Oral Q8H   losartan  100 mg Oral Daily   mycophenolate  540 mg Oral BID   predniSONE  15 mg Oral Daily   sodium chloride flush  3 mL Intravenous Q12H    promethazine (PHENERGAN) injection (IM or IVPB)  12.5 mg (01/31/22 1714)   acetaminophen **OR** acetaminophen, albuterol, calcium carbonate (dosed in mg elemental calcium), camphor-menthol **AND** hydrOXYzine, docusate sodium, feeding supplement (NEPRO CARB STEADY), hydrALAZINE, labetalol, ondansetron **OR** ondansetron (ZOFRAN) IV, promethazine (PHENERGAN) injection (IM or IVPB), sorbitol, zolpidem

## 2022-02-01 NOTE — Progress Notes (Signed)
Pt receives out-pt HD at [facility] on MWF. Pt arrives at 6:10 for 6:30 chair time. Will assist as needed.   Melven Sartorius Renal Navigator [phone]

## 2022-02-01 NOTE — Progress Notes (Signed)
PROGRESS NOTE  [name] [name]  DOB: December 07, 1980  PCP: [facility] BOF:[id number]  [place]: 01/30/2022  LOS: 1 day  Hospital Day: 3  Brief narrative: [name] [name] is a 41 y.o. female with PMH significant for ESRD-HD-MWF, h/o renal/pancreatic transplant (kidney failed, pancreas still working on IS x 3), HTN, type I DM, gastroparesis, chronic pain under the care of pain management specialist, also with alleged history of narcotic abuse. Patient presented to the ED on 5/29 with complaint of worsening chest pain, abdominal pain, headache associated with persistently elevated blood pressure for more than a week..  She also complained of palpitation, diarrhea, anxiety.  Reports compliance to blood pressure medications and dialysis. In the ED, CTA chest was negative for dissection.  Chest x-ray showed cardiomegaly with vascular congestion. Patient was admitted to hospitalist service Nephrology was consulted. See below for details.  Subjective: Patient was seen and examined this morning.  Pleasant young female.  Sitting up in bed.  Not in distress.  Reports that her pain is worse every time the blood pressure is elevated.  Telemetry monitoring showing an elevated systolic blood pressure reading of 219.  Principal Problem:   Hypertensive crisis Active Problems:   ESRD on dialysis [facility])   Pancreas transplant status ([place])   Type 1 diabetes ([place])   Narcotic dependence ([place])   Hypertensive emergency    Assessment and Plan: Hypertensive crisis -Patient presented with severe abdominal pain, chest pain and headache with markedly uncontrolled HTN, concerning for hypertensive crisis. -CT chest abdomen pelvis was negative for any dissection or any other acute intra-abdominal or pelvic pathology.   -Blood pressure continues to fluctuate.  Over 210 this morning.   -Discussed with nephrologist.  Medicines being titrated.   -Currently on clonidine, losartan, hydralazine, amlodipine.    -Continue to monitor blood pressure.  Intractable chest/abdominal pain History of chronic pain -Patient has history of chronic pain and follows up with pain management specialist as an outpatient. -She says she is on oxycodone 5 mg 3 times daily at home.  Currently getting as needed IV Dilaudid. -no specific new source of pain but states that pain is worse limiting her blood pressure goes up.   -Expect improvement in pain after dialysis and improvement in blood pressure today with medication adjustment. -Resume scheduled Percocet, start low-dose IV Dilaudid as needed for now.  ESRD-HD-MWF -Dialysis on schedule today.  Nephrology following.   History of renal and pancreatic transplant -Unfortunately her kidney failed, pancreas still working on IS x 3 -Continue mycophenolate, cyclosporine, prednisone -Last A1c 4.1 on 11/11/2021. -Currently on sliding scale.  Goals of care   Code Status: Full Code    Mobility: Encourage ambulation  Skin assessment:     Nutritional status:  Body mass index is 21.61 kg/m.          Diet:  Diet Order             Diet renal with fluid restriction Fluid restriction: 1200 mL Fluid; Room service appropriate? Yes; Fluid consistency: Thin  Diet effective now                   DVT prophylaxis:  Place and maintain sequential compression device Start: 01/31/22 1906 heparin injection 5,000 Units Start: 01/30/22 0915   Antimicrobials: None Fluid: None Consultants: Nephrology Family Communication: None at bedside  Status is: Inpatient  Continue in-hospital care because: Continues to have significant elevated blood pressure, pain Level of care: Telemetry Medical   Dispo: The patient is  from: Home              Anticipated d/c is to: Home in 1 to 2 days              Patient currently is not medically stable to d/c.   Difficult to place patient No     Infusions:   promethazine (PHENERGAN) injection (IM or IVPB) 12.5 mg (01/31/22  1714)    Scheduled Meds:  amLODipine  10 mg Oral Daily   aspirin  81 mg Oral Daily   calcium acetate  2,668 mg Oral TID WC   Chlorhexidine Gluconate Cloth  6 each Topical Q0600   cloNIDine  0.2 mg Transdermal Weekly   cycloSPORINE modified  200 mg Oral BID   doxercalciferol  6 mcg Intravenous Q M,W,F-HD   ferric citrate  630 mg Oral TID   heparin  5,000 Units Subcutaneous Q8H   hydrALAZINE  75 mg Oral Q8H   LORazepam  1 mg Intravenous Once in dialysis   losartan  100 mg Oral Daily   mycophenolate  540 mg Oral BID   oxyCODONE-acetaminophen  1 tablet Oral Q6H   predniSONE  15 mg Oral Daily   sodium chloride flush  3 mL Intravenous Q12H    PRN meds: acetaminophen **OR** acetaminophen, albuterol, calcium carbonate (dosed in mg elemental calcium), camphor-menthol **AND** hydrOXYzine, docusate sodium, feeding supplement (NEPRO CARB STEADY), hydrALAZINE, labetalol, ondansetron **OR** ondansetron (ZOFRAN) IV, promethazine (PHENERGAN) injection (IM or IVPB), sorbitol, zolpidem   Antimicrobials: Anti-infectives (From admission, onward)    None       Objective: Vitals:   02/01/22 0730 02/01/22 0811  BP: (!) 183/86 (!) 187/90  Pulse: (!) 106 100  Resp: 20 19  Temp: 98.1 F (36.7 C)   SpO2: 98%     Intake/Output Summary (Last 24 hours) at 02/01/2022 1142 Last data filed at 01/31/2022 1719 Gross per 24 hour  Intake 487.41 ml  Output --  Net 487.41 ml   Filed Weights   01/30/22 1205 01/30/22 1545 02/01/22 0400  Weight: 58.5 kg 55.5 kg 57.1 kg   Weight change: -1.2 kg Body mass index is 21.61 kg/m.   Physical Exam: General exam: Pleasant, young female.  Mild distress because of pain Skin: No rashes, lesions or ulcers. HEENT: Atraumatic, normocephalic, no obvious bleeding Lungs: Clear to auscultation bilaterally CVS: Regular rate and rhythm, no murmur GI/Abd soft, mild diffuse tenderness, nondistended, bowel sound present CNS: Alert, awake, oriented x3 Psychiatry: Mood  appropriate Extremities: No pedal edema, no calf tenderness  Data Review: I have personally reviewed the laboratory data and studies available.  F/u labs  Unresulted Labs (From admission, onward)    None       Signed, Terrilee Croak, MD [name] 02/01/2022

## 2022-02-02 ENCOUNTER — Inpatient Hospital Stay (HOSPITAL_COMMUNITY): Payer: Medicare Other

## 2022-02-02 DIAGNOSIS — I169 Hypertensive crisis, unspecified: Secondary | ICD-10-CM | POA: Diagnosis not present

## 2022-02-02 LAB — BASIC METABOLIC PANEL

## 2022-02-02 LAB — CBC

## 2022-02-02 IMAGING — CR DG CHEST 2V
2 series · 2 of 2 positions shown · non-contrast
Comparison: AP chest [DATE] [DATE] [DATE]

CLINICAL DATA: Chest pain and hypertension.

EXAM:
CHEST - 2 VIEW

[chest pa]
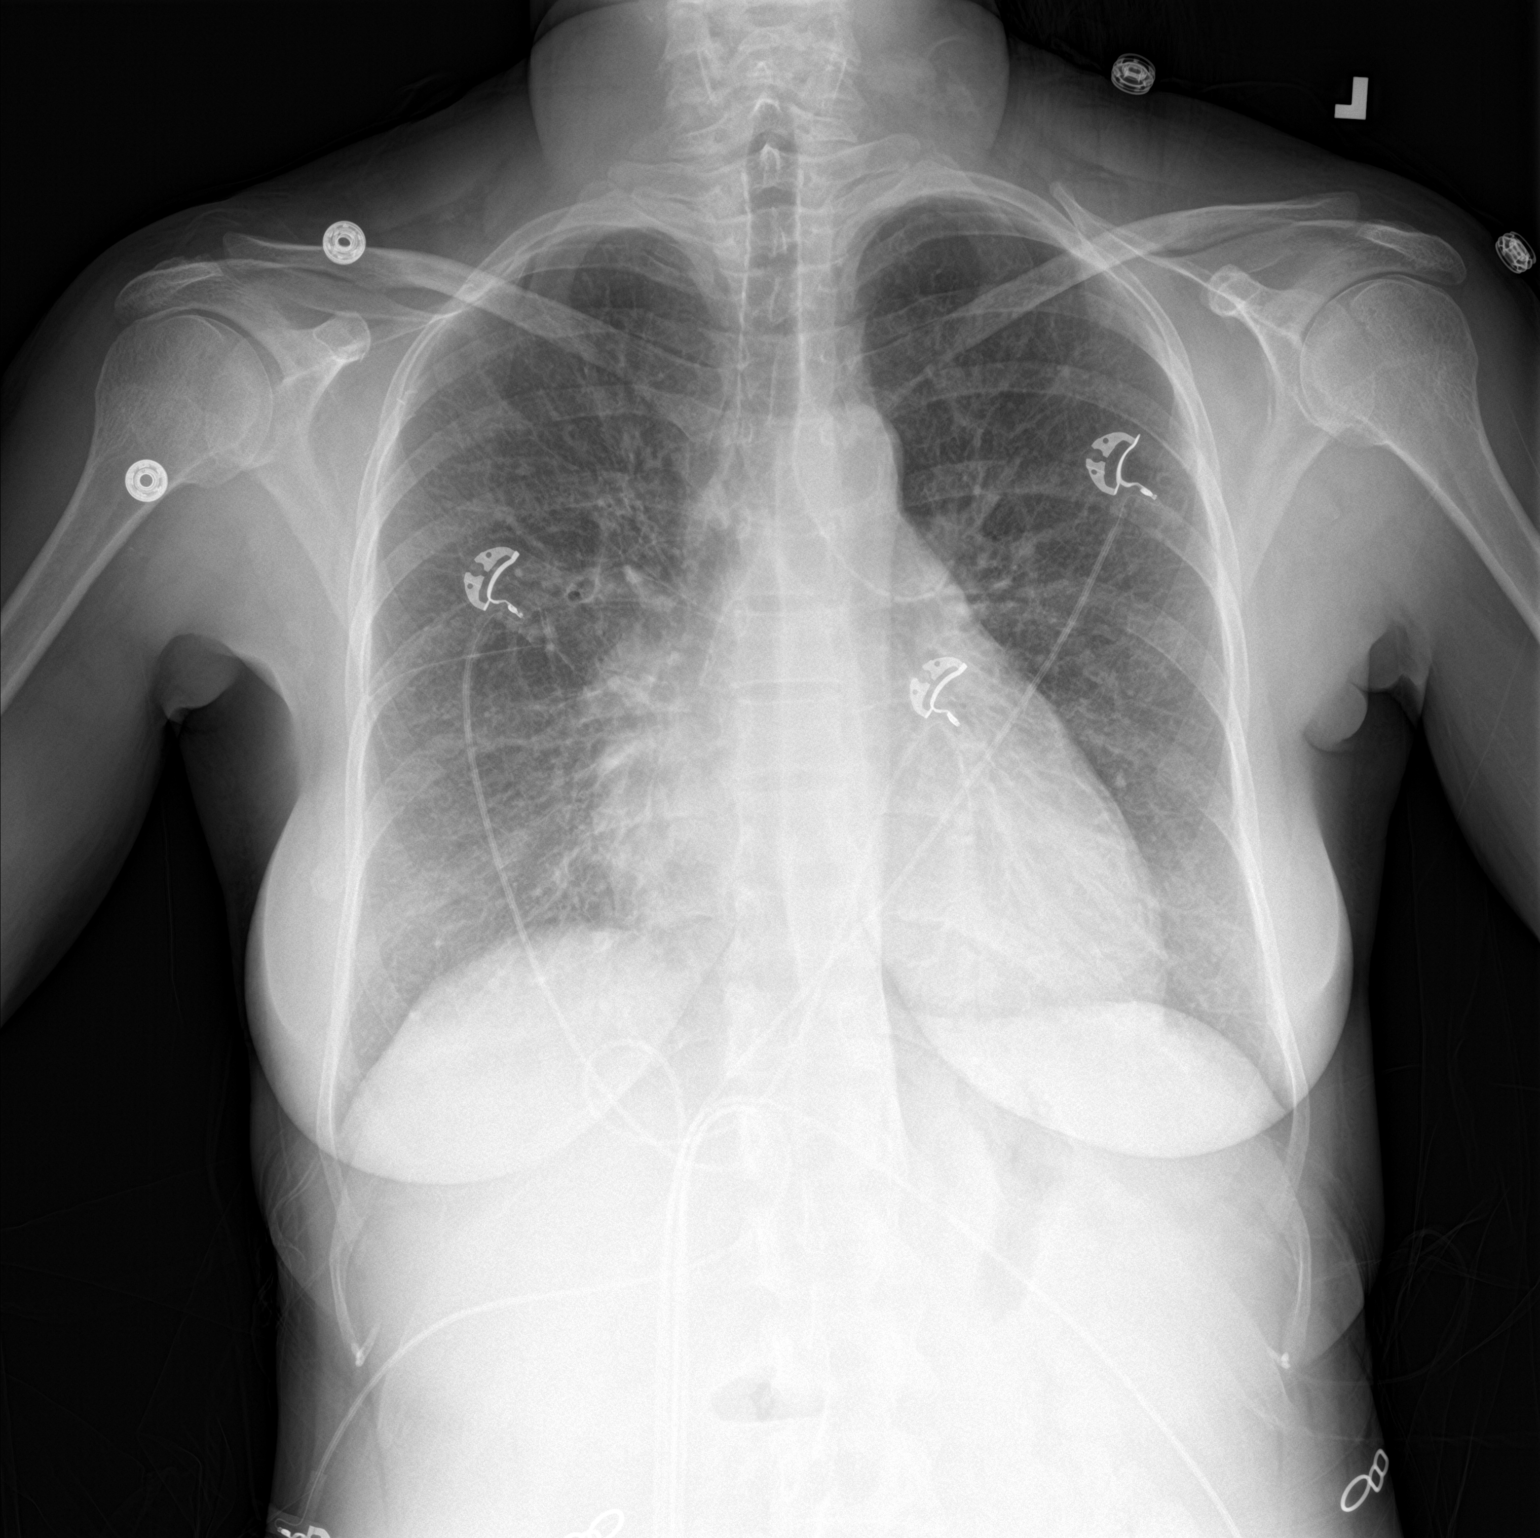

[chest lat]
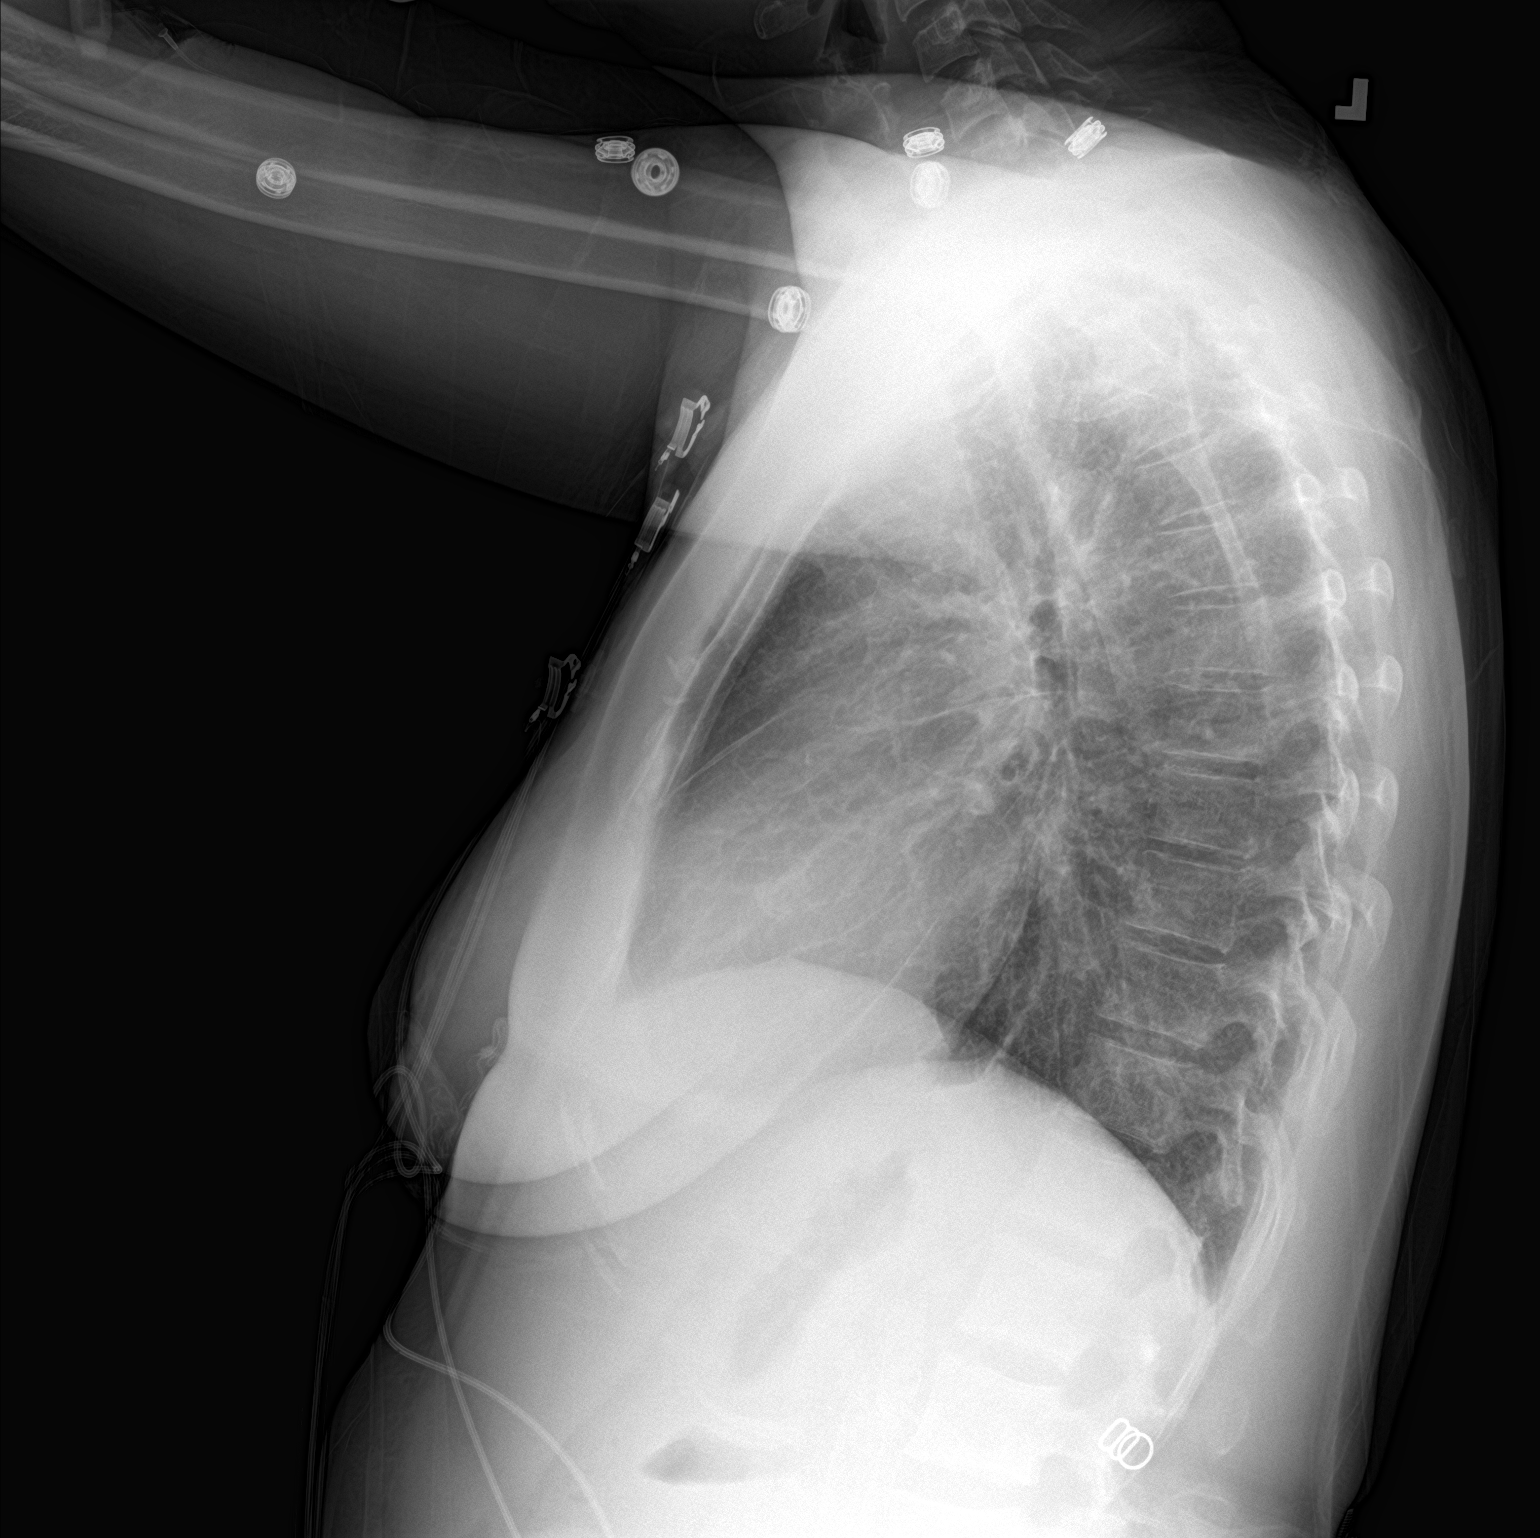

[2 of 2 positions shown; findings below may reference images not displayed]

FINDINGS: Cardiac silhouette is again mildly enlarged. Mediastinal contours
are within normal limits. There is again central pulmonary vascular
congestion, however there is no overt pulmonary edema. Interstitial
markings are similar to [DATE], and aga[REDACTED]reased in thickness
from [DATE] and [DATE] when there was both interstitial and
alveolar pulmonary edema. No pleural effusion or pneumothorax. No
acute skeletal abnormality.
IMPRESSION: Stable cardiomegaly with central pulmonary vascular congestion but
no overt pulmonary edema.

## 2022-02-02 MED ORDER — LORAZEPAM 2 MG/ML IJ SOLN
0.5000 mg | Freq: Four times a day (QID) | INTRAMUSCULAR | Status: DC | PRN
Start: 2022-02-02 — End: 2022-02-05
  Administered 2022-02-02 – 2022-02-05 (×8): 0.5 mg via INTRAVENOUS
  Filled 2022-02-02 (×8): qty 1

## 2022-02-02 MED ORDER — CARVEDILOL 12.5 MG PO TABS
12.5000 mg | ORAL_TABLET | Freq: Two times a day (BID) | ORAL | Status: DC
  Administered 2022-02-02 – 2022-02-05 (×6): 12.5 mg via ORAL
  Filled 2022-02-02 (×6): qty 1

## 2022-02-02 MED ORDER — ALPRAZOLAM 0.5 MG PO TABS
0.5000 mg | ORAL_TABLET | Freq: Three times a day (TID) | ORAL | Status: DC
  Administered 2022-02-02 – 2022-02-05 (×9): 0.5 mg via ORAL
  Filled 2022-02-02 (×9): qty 1

## 2022-02-02 MED ORDER — CHLORHEXIDINE GLUCONATE CLOTH 2 % EX PADS
6.0000 | MEDICATED_PAD | Freq: Every day | CUTANEOUS | Status: DC
  Administered 2022-02-03 – 2022-02-04 (×2): 6 via TOPICAL

## 2022-02-02 NOTE — Progress Notes (Signed)
PROGRESS NOTE  [name] [name]  DOB: 03/09/1981  PCP: [facility] [place] MPN:[id number]  [place]: 01/30/2022  LOS: 2 days  Hospital Day: 4  Brief narrative: [name] [name] is a 41 y.o. female with PMH significant for ESRD-HD-MWF, h/o renal/pancreatic transplant (kidney failed, pancreas still working on IS x 3), HTN, type I DM, gastroparesis, chronic pain under the care of pain management specialist, also with alleged history of narcotic abuse. Patient presented to the ED on 5/29 with complaint of worsening chest pain, abdominal pain, headache associated with persistently elevated blood pressure for more than a week..  She also complained of palpitation, diarrhea, anxiety.  Reports compliance to blood pressure medications and dialysis. In the ED, CTA chest was negative for dissection.  Chest x-ray showed cardiomegaly with vascular congestion. Patient was admitted to hospitalist service Nephrology was consulted. See below for details.  Subjective: Patient was seen and examined this morning.   Sitting up in bed.  She is anxious about her blood pressure consistently elevated.  Discussed with Dr. Tora Perches.  He states that yesterday she was given IV Ativan before dialysis after which she came down and was able to tolerate dialysis.  We planned to keep her schedule and anxiety today.  Principal Problem:   Hypertensive crisis Active Problems:   ESRD on dialysis [facility])   Pancreas transplant status ([place])   Type 1 diabetes ([place])   Narcotic dependence ([place])   Hypertensive emergency    Assessment and Plan: Hypertensive crisis -Patient presented with severe abdominal pain, chest pain and headache with markedly uncontrolled HTN, concerning for hypertensive crisis. -CT chest abdomen pelvis was negative for any dissection or any other acute intra-abdominal or pelvic pathology.   -Blood pressure continues to fluctuate.  190s today. -Discussed with nephrologist.  Her blood pressure medicines  were last adjusted yesterday.  Today I started her on scheduled Xanax and as needed IV Ativan to see if controlling her anxiety helps her blood pressure further. -Currently on clonidine, losartan, hydralazine, amlodipine.   -Continue to monitor blood pressure.  Intractable chest/abdominal pain History of chronic pain -Patient has history of chronic pain and follows up with pain management specialist as an outpatient. -She says she is on oxycodone 5 mg 3 times daily at home.  Currently getting as needed IV Dilaudid. -no specific new source of pain but states that pain is worse limiting her blood pressure goes up.   -Patient had last EGD on 12/03/2021.  She had erythematous duodenopathy.  No ulcers or bleeding. -Resume scheduled Percocet, start low-dose IV Dilaudid as needed for now.  ESRD-HD-MWF -Dialysis on schedule.  Next dialysis tomorrow nephrology following.   History of renal and pancreatic transplant -Unfortunately her kidney failed, pancreas still working on IS x 3 -Continue mycophenolate, cyclosporine, prednisone -Last A1c 4.1 on 11/11/2021. -Currently on sliding scale.  Goals of care   Code Status: Full Code    Mobility: Encourage ambulation  Skin assessment:     Nutritional status:  Body mass index is 21.39 kg/m.          Diet:  Diet Order             Diet renal with fluid restriction Fluid restriction: 1200 mL Fluid; Room service appropriate? Yes; Fluid consistency: Thin  Diet effective now                   DVT prophylaxis:  Place and maintain sequential compression device Start: 01/31/22 1906 heparin injection 5,000 Units Start: 01/30/22 0915  Antimicrobials: None Fluid: None Consultants: Nephrology Family Communication: None at bedside  Status is: Inpatient  Continue in-hospital care because: Continues to have significant elevated blood pressure, anxiety Level of care: Telemetry Medical   Dispo: The patient is from: Home               Anticipated d/c is to: Home in 1 to 2 days              Patient currently is not medically stable to d/c.   Difficult to place patient No     Infusions:     Scheduled Meds:  ALPRAZolam  0.5 mg Oral TID   amLODipine  10 mg Oral Daily   aspirin  81 mg Oral Daily   calcium acetate  2,668 mg Oral TID WC   Chlorhexidine Gluconate Cloth  6 each Topical Q0600   cloNIDine  0.2 mg Transdermal Weekly   cycloSPORINE modified  200 mg Oral BID   doxercalciferol  6 mcg Intravenous Q M,W,F-HD   ferric citrate  630 mg Oral TID   heparin  5,000 Units Subcutaneous Q8H   hydrALAZINE  75 mg Oral Q8H   losartan  100 mg Oral Daily   mycophenolate  540 mg Oral BID   oxyCODONE-acetaminophen  1 tablet Oral Q6H   predniSONE  15 mg Oral Daily   sodium chloride flush  3 mL Intravenous Q12H    PRN meds: acetaminophen **OR** acetaminophen, albuterol, calcium carbonate (dosed in mg elemental calcium), camphor-menthol **AND** hydrOXYzine, docusate sodium, feeding supplement (NEPRO CARB STEADY), hydrALAZINE, labetalol, LORazepam, ondansetron **OR** ondansetron (ZOFRAN) IV, sorbitol, zolpidem   Antimicrobials: Anti-infectives (From admission, onward)    None       Objective: Vitals:   02/02/22 0412 02/02/22 0804  BP: (!) 187/94 (!) 181/90  Pulse: (!) 102 (!) 105  Resp: 17 19  Temp: 98.3 F (36.8 C) 98.1 F (36.7 C)  SpO2: 96% 99%   No intake or output data in the 24 hours ending 02/02/22 1118  Filed Weights   01/30/22 1545 02/01/22 0400 02/02/22 0429  Weight: 55.5 kg 57.1 kg 56.5 kg   Weight change: -0.582 kg Body mass index is 21.39 kg/m.   Physical Exam: General exam: Pleasant, young female.  Mild distress because of pain Skin: No rashes, lesions or ulcers. HEENT: Atraumatic, normocephalic, no obvious bleeding Lungs: Clear to auscultation bilaterally CVS: Regular rate and rhythm, no murmur GI/Abd soft, mild diffuse tenderness, nondistended, bowel sound present CNS: Alert, awake,  oriented x3 Psychiatry: Anxious Extremities: No pedal edema, no calf tenderness  Data Review: I have personally reviewed the laboratory data and studies available.  F/u labs  Unresulted Labs (From admission, onward)     Start     Ordered   02/02/22 0354  Basic metabolic panel  Daily,   R     Question:  Specimen collection method  Answer:  Lab=Lab collect   02/02/22 6568            Signed, Terrilee Croak, MD [name] 02/02/2022

## 2022-02-02 NOTE — Plan of Care (Signed)
  Problem: Education: Goal: Knowledge of General Education information will improve Description: Including pain rating scale, medication(s)/side effects and non-pharmacologic comfort measures Outcome: Progressing   Problem: Health Behavior/Discharge Planning: Goal: Ability to manage health-related needs will improve Outcome: Progressing   Problem: Clinical Measurements: Goal: Ability to maintain clinical measurements within normal limits will improve Outcome: Progressing   Problem: Clinical Measurements: Goal: Will remain free from infection Outcome: Progressing   Problem: Nutrition: Goal: Adequate nutrition will be maintained Outcome: Progressing   Problem: Coping: Goal: Level of anxiety will decrease Outcome: Progressing   Problem: Safety: Goal: Ability to remain free from injury will improve Outcome: Progressing

## 2022-02-02 NOTE — Progress Notes (Signed)
[facility] Progress Note  Subjective: seen in room, ativan helped " a lot " w/ HD yesterday. SBP remain high 170 range. DBP 90s. No SOB. Repeat CXR mild congestion, no edema.   Vitals:   02/01/22 2336 02/02/22 0412 02/02/22 0429 02/02/22 0804  BP: (!) 174/86 (!) 187/94  (!) 181/90  Pulse: (!) 108 (!) 102  (!) 105  Resp: '19 17  19  '$ Temp: 98.1 F (36.7 C) 98.3 F (36.8 C)  98.1 F (36.7 C)  TempSrc: Oral Oral  Oral  SpO2: 97% 96%  99%  Weight:   56.5 kg   Height:        Exam: Gen alert No jvd or bruits Chest clear bilat to bases RRR no RG Abd soft ntnd no mass or ascites +bs Ext no LE edema Neuro is alert, Ox 3 , nf  RUA AVF+bruit         Home meds include - norvasc 10, asa, auryxia 3 ac, phoslyra 3 ac, catapres patch 0.1 weekly, cyclosporine 200 bid, ergocalciferol, lasix 20 qd, losartan 100, mycophenolate 540 bid, narcan prn, percocet, prednisone 15 qd, lokelma 10 gm prn     CXR - bilat vasc congestion, early bilat IS edema      OP HD: MWF AF  3h 42mn  53.5kg  400/500   2/2 bath  RUA AVF  Hep none - last HD 5/26, 57.6 > 54.4kg - last Hb 10.6 on 5/24 - mircera 200 q2, last 5/24, due 6/07 - hectorol 8 ug tiw IV - sensipar 90 po tiw   Assessment/ Plan: HTN'sive crisis - w/ abd pain, chest pains, headache.  SBP's remain high. Increased catapres patch to 0.2 and ^hydralazine to 75 tid. Also getting norvasc, losartan. Will add BB today w/ coreg 12.5 bid. Need to get her volume down w/ HD tomorrow.  ESRD - on HD MWF. Has not missed HD. HD Friday am.  Volume - is up ~4kg. Did not tolerate large UF 1st HD here. Try 3.5 L next hd.  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 Phos a bit high. Cont binders x 2 and IV vdra     [name]  Lynix [name] 02/02/2022, 12:29 PM  Inpatient medications:  ALPRAZolam  0.5 mg Oral TID   amLODipine  10 mg Oral Daily   aspirin  81 mg Oral Daily   calcium acetate  2,668 mg Oral TID WC   Chlorhexidine Gluconate Cloth  6 each Topical Q0600   cloNIDine  0.2 mg Transdermal Weekly   cycloSPORINE modified  200 mg Oral BID   doxercalciferol  6 mcg Intravenous Q M,W,F-HD   ferric citrate  630 mg Oral TID   heparin  5,000 Units Subcutaneous Q8H   hydrALAZINE  75 mg Oral Q8H   losartan  100 mg Oral Daily   mycophenolate  540 mg Oral BID   oxyCODONE-acetaminophen  1 tablet Oral Q6H   predniSONE  15 mg Oral Daily   sodium chloride flush  3 mL Intravenous Q12H     acetaminophen **OR** acetaminophen, albuterol, calcium carbonate (dosed in mg elemental calcium), camphor-menthol **AND** hydrOXYzine, docusate sodium, feeding supplement (NEPRO CARB STEADY), hydrALAZINE, labetalol, LORazepam, ondansetron **OR** ondansetron (ZOFRAN) IV, sorbitol, zolpidem

## 2022-02-03 DIAGNOSIS — E875 Hyperkalemia: Secondary | ICD-10-CM

## 2022-02-03 LAB — POTASSIUM: Potassium: 4 mmol/L (ref 3.5–5.1)

## 2022-02-03 LAB — GLUCOSE, CAPILLARY: Glucose-Capillary: 109 mg/dL — ABNORMAL HIGH (ref 70–99)

## 2022-02-03 LAB — BASIC METABOLIC PANEL
Anion gap: 15 (ref 5–15)
BUN: 77 mg/dL — ABNORMAL HIGH (ref 6–20)
CO2: 25 mmol/L (ref 22–32)
Calcium: 10.4 mg/dL — ABNORMAL HIGH (ref 8.9–10.3)
Chloride: 97 mmol/L — ABNORMAL LOW (ref 98–111)
Creatinine, Ser: 8.89 mg/dL — ABNORMAL HIGH (ref 0.44–1.00)
GFR, Estimated: 5 mL/min — ABNORMAL LOW (ref >60)
Glucose, Bld: 106 mg/dL — ABNORMAL HIGH (ref 70–99)
Potassium: 6.3 mmol/L (ref 3.5–5.1)
Sodium: 137 mmol/L (ref 135–145)

## 2022-02-03 MED ORDER — LORAZEPAM 2 MG/ML IJ SOLN: 1.0000 mg | Freq: Once | INTRAMUSCULAR | Status: AC

## 2022-02-03 MED ORDER — SODIUM ZIRCONIUM CYCLOSILICATE 10 G PO PACK
10.0000 g | PACK | Freq: Once | ORAL | Status: AC
Start: 2022-02-03 — End: 2022-02-03
  Administered 2022-02-03: 10 g via ORAL
  Filled 2022-02-03: qty 1

## 2022-02-03 MED ORDER — OXYCODONE-ACETAMINOPHEN 5-325 MG PO TABS
1.0000 | ORAL_TABLET | Freq: Four times a day (QID) | ORAL | Status: AC
  Administered 2022-02-03 – 2022-02-04 (×5): 1 via ORAL
  Filled 2022-02-03 (×5): qty 1

## 2022-02-03 MED ORDER — SODIUM CHLORIDE 0.9 % IV SOLN
12.5000 mg | Freq: Four times a day (QID) | INTRAVENOUS | Status: DC | PRN
  Administered 2022-02-03 – 2022-02-05 (×5): 12.5 mg via INTRAVENOUS
  Filled 2022-02-03 (×6): qty 0.5

## 2022-02-03 MED ORDER — INSULIN ASPART 100 UNIT/ML IV SOLN
5.0000 [IU] | Freq: Once | INTRAVENOUS | Status: AC
  Administered 2022-02-03: 5 [IU] via INTRAVENOUS

## 2022-02-03 MED ORDER — DEXTROSE 50 % IV SOLN
1.0000 | Freq: Once | INTRAVENOUS | Status: AC
Start: 2022-02-03 — End: 2022-02-03
  Administered 2022-02-03: 50 mL via INTRAVENOUS
  Filled 2022-02-03: qty 50

## 2022-02-03 MED ORDER — LORAZEPAM 2 MG/ML IJ SOLN
0.5000 mg | Freq: Once | INTRAMUSCULAR | Status: AC
Start: 2022-02-03 — End: 2022-02-03
  Administered 2022-02-03: 0.5 mg via INTRAVENOUS
  Filled 2022-02-03: qty 1

## 2022-02-03 MED ORDER — DEXTROSE 50 % IV SOLN
INTRAVENOUS | Status: AC
  Filled 2022-02-03: qty 50

## 2022-02-03 NOTE — Progress Notes (Signed)
PROGRESS NOTE  [name] [name]  DOB: 1981-05-03  PCP: [facility] [place] IAX:[id number]  [place]: 01/30/2022  LOS: 3 days  Hospital Day: 5  Brief narrative: [name] [name] is a 41 y.o. female with PMH significant for ESRD-HD-MWF, h/o renal/pancreatic transplant (kidney failed, pancreas still working on IS x 3), HTN, type I DM, gastroparesis, chronic pain under the care of pain management specialist, also with alleged history of narcotic abuse. Patient presented to the ED on 5/29 with complaint of worsening chest pain, abdominal pain, headache associated with persistently elevated blood pressure for more than a week..  She also complained of palpitation, diarrhea, anxiety.  Reports compliance to blood pressure medications and dialysis. In the ED, CTA chest was negative for dissection.  Chest x-ray showed cardiomegaly with vascular congestion. Patient was admitted to hospitalist service Nephrology was consulted. See below for details.  Subjective: Patient was seen and examined this morning in dialysis unit.  Not in distress.  On IV Ativan as needed and Xanax scheduled for anxiety.  Blood pressure remains elevated in 170s  Principal Problem:   Hypertensive crisis Active Problems:   ESRD on dialysis [facility])   Pancreas transplant status ([place])   Type 1 diabetes ([name] [name])   Narcotic dependence ([place])   Hypertensive emergency    Assessment and Plan: Hypertensive crisis -Patient presented with severe abdominal pain, chest pain and headache with markedly uncontrolled HTN, concerning for hypertensive crisis. -CT chest abdomen pelvis was negative for any dissection or any other acute intra-abdominal or pelvic pathology.   -Blood pressure continues to fluctuate.  170s today.  Continue monitor on clonidine, losartan, hydralazine, amlodipine.  Intractable chest/abdominal pain History of chronic pain Anxiety -Patient has history of chronic pain, anxiety and follows up with pain management  specialist as an outpatient. -Currently she is on oral oxycodone 3 times daily for pain control.  For anxiety, she is also on scheduled Xanax and as needed IV Ativan.  PRN IV Phenergan for nausea. -Patient had last EGD on 12/03/2021.  She had erythematous duodenopathy.  No ulcers or bleeding. -Hemoglobin stable.  ESRD-HD-MWF -Dialysis on schedule.     History of renal and pancreatic transplant -Unfortunately her kidney failed, pancreas still working on IS x 3 -Continue mycophenolate, cyclosporine, prednisone -Last A1c 4.1 on 11/11/2021. -Currently on sliding scale.  Goals of care   Code Status: Full Code    Mobility: Encourage ambulation  Skin assessment:     Nutritional status:  Body mass index is 22.06 kg/m.          Diet:  Diet Order             Diet renal with fluid restriction Fluid restriction: 1200 mL Fluid; Room service appropriate? Yes; Fluid consistency: Thin  Diet effective now                   DVT prophylaxis:  Place and maintain sequential compression device Start: 01/31/22 1906 heparin injection 5,000 Units Start: 01/30/22 0915   Antimicrobials: None Fluid: None Consultants: Nephrology Family Communication: None at bedside  Status is: Inpatient  Continue in-hospital care because: Hard to control anxiety.  Constantly requiring IV Ativan blood pressure continues to remain elevated Level of care: Telemetry Medical   Dispo: The patient is from: Home              Anticipated d/c is to: Home in 1 to 2 days              Patient currently is  not medically stable to d/c.   Difficult to place patient No     Infusions:   promethazine (PHENERGAN) injection (IM or IVPB)       Scheduled Meds:  ALPRAZolam  0.5 mg Oral TID   amLODipine  10 mg Oral Daily   aspirin  81 mg Oral Daily   calcium acetate  2,668 mg Oral TID WC   carvedilol  12.5 mg Oral BID WC   Chlorhexidine Gluconate Cloth  6 each Topical Q0600   Chlorhexidine Gluconate Cloth  6  each Topical Q0600   cloNIDine  0.2 mg Transdermal Weekly   cycloSPORINE modified  200 mg Oral BID   doxercalciferol  6 mcg Intravenous Q M,W,F-HD   ferric citrate  630 mg Oral TID   heparin  5,000 Units Subcutaneous Q8H   hydrALAZINE  75 mg Oral Q8H   losartan  100 mg Oral Daily   mycophenolate  540 mg Oral BID   oxyCODONE-acetaminophen  1 tablet Oral Q6H   predniSONE  15 mg Oral Daily   sodium chloride flush  3 mL Intravenous Q12H    PRN meds: acetaminophen **OR** acetaminophen, albuterol, calcium carbonate (dosed in mg elemental calcium), camphor-menthol **AND** hydrOXYzine, docusate sodium, feeding supplement (NEPRO CARB STEADY), hydrALAZINE, labetalol, LORazepam, promethazine (PHENERGAN) injection (IM or IVPB), sorbitol, zolpidem   Antimicrobials: Anti-infectives (From admission, onward)    None       Objective: Vitals:   02/03/22 1155 02/03/22 1258  BP:  (!) 184/84  Pulse:  (!) 102  Resp: 20 15  Temp:  98.1 F (36.7 C)  SpO2:  97%    Intake/Output Summary (Last 24 hours) at 02/03/2022 1319 Last data filed at 02/03/2022 0340 Gross per 24 hour  Intake --  Output 2 ml  Net -2 ml    Filed Weights   02/01/22 0400 02/02/22 0429 02/03/22 1029  Weight: 57.1 kg 56.5 kg 58.3 kg   Weight change:  Body mass index is 22.06 kg/m.   Physical Exam: General exam: Pleasant, young female.   Skin: No rashes, lesions or ulcers. HEENT: Atraumatic, normocephalic, no obvious bleeding Lungs: Clear to auscultation bilaterally CVS: Regular rate and rhythm, no murmur GI/Abd soft, mild diffuse tenderness, nondistended, bowel sound present CNS: Alert, awake, oriented x3 Psychiatry: Anxious Extremities: No pedal edema, no calf tenderness  Data Review: I have personally reviewed the laboratory data and studies available.  F/u labs  Unresulted Labs (From admission, onward)     Start     Ordered   02/03/22 1313  CBC  ONCE - STAT,   STAT       Question:  Specimen collection  method  Answer:  Lab=Lab collect   02/03/22 1312   02/03/22 0900  Potassium  Once-Timed,   TIMED       Comments: Until normal twice.   Question:  Specimen collection method  Answer:  Lab=Lab collect   02/03/22 0455   02/02/22 9924  Basic metabolic panel  Daily,   R     Question:  Specimen collection method  Answer:  Lab=Lab collect   02/02/22 2683            Signed, Terrilee Croak, MD [name] 02/03/2022

## 2022-02-03 NOTE — Progress Notes (Signed)
Dr Jonnie Finner called with critical K >6 ordered 1K bath entire tx reported directly to bedside nurse Kathryne Sharper, LPN.

## 2022-02-03 NOTE — Progress Notes (Signed)
[facility] Progress Note  Subjective: seen in HD. BP's a little better, 170s/ 80s. K+ up 6.3 this am.   Vitals:   02/02/22 1914 02/02/22 2042 02/03/22 0000 02/03/22 0339  BP: (!) 185/103 (!) 165/80 (!) 176/90 (!) 173/81  Pulse:  100 98 (!) 105  Resp:  '17 18 17  '$ Temp:  98 F (36.7 C) 97.9 F (36.6 C) 98.1 F (36.7 C)  TempSrc:  Oral Oral Oral  SpO2:  99% 98% 98%  Weight:      Height:        Exam: Gen alert No jvd or bruits Chest clear bilat to bases RRR no RG Abd soft ntnd no mass or ascites +bs Ext no LE edema Neuro is alert, Ox 3 , nf  RUA AVF+bruit         Home meds include - norvasc 10, asa, auryxia 3 ac, phoslyra 3 ac, catapres patch 0.1 weekly, cyclosporine 200 bid, ergocalciferol, lasix 20 qd, losartan 100, mycophenolate 540 bid, narcan prn, percocet, prednisone 15 qd, lokelma 10 gm prn     CXR - bilat vasc congestion, early bilat IS edema      OP HD: MWF AF  3h 31mn  53.5kg  400/500   2/2 bath  RUA AVF  Hep none - last HD 5/26, 57.6 > 54.4kg - last Hb 10.6 on 5/24 - mircera 200 q2, last 5/24, due 6/07 - hectorol 8 ug tiw IV - sensipar 90 po tiw   Assessment/ Plan: HTN'sive crisis - w/ abd pain, chest pains, headache.  SBP's remain high. Increased catapres patch to 0.2 and ^hydralazine to 75 tid. Also getting norvasc, losartan. Added beta-blocker (coreg) yest 6/01. Titrate as needed.  Need to get volume down w/ HD.  Hyperkalemia - use 1K bath total HD today. F/u this afternoon.  ESRD - on HD MWF. Has not missed HD. HD this am.  Volume - up 5kg today standing, plan UFG 3 L Hx renal (failed) / pancreas transplant - on cyclosporine, mycophenolate and prednisone Narcotic dependence - per pmd Anxiety - responds well to anxiolytics here, have d/w pmd Anemia ckd - Hb 11.4, no esa needs MBD ckd - CCa in range. Phos a bit high. Cont binders x 2 and IV vdra     [name] [name] Fake 02/03/2022, 8:01 AM  Inpatient medications:  ALPRAZolam  0.5 mg Oral TID    amLODipine  10 mg Oral Daily   aspirin  81 mg Oral Daily   calcium acetate  2,668 mg Oral TID WC   carvedilol  12.5 mg Oral BID WC   Chlorhexidine Gluconate Cloth  6 each Topical Q0600   Chlorhexidine Gluconate Cloth  6 each Topical Q0600   cloNIDine  0.2 mg Transdermal Weekly   cycloSPORINE modified  200 mg Oral BID   doxercalciferol  6 mcg Intravenous Q M,W,F-HD   ferric citrate  630 mg Oral TID   heparin  5,000 Units Subcutaneous Q8H   hydrALAZINE  75 mg Oral Q8H   losartan  100 mg Oral Daily   mycophenolate  540 mg Oral BID   oxyCODONE-acetaminophen  1 tablet Oral Q6H   predniSONE  15 mg Oral Daily   sodium chloride flush  3 mL Intravenous Q12H     acetaminophen **OR** acetaminophen, albuterol, calcium carbonate (dosed in mg elemental calcium), camphor-menthol **AND** hydrOXYzine, docusate sodium, feeding supplement (NEPRO CARB STEADY), hydrALAZINE, labetalol, LORazepam, ondansetron **OR** ondansetron (ZOFRAN) IV, sorbitol, zolpidem

## 2022-02-03 NOTE — Progress Notes (Signed)
Critical lab of K+ 6.3 reported to Dr. Myna Hidalgo by RN. New order for EKG. Patient scheduled for dialysis today. Fuller Canada, RN

## 2022-02-03 NOTE — Progress Notes (Signed)
Potassium 6.3 this am. Plan to treat with Lokelma and insulin with dextrose, check EKG and give bicarb and calcium if QRS wide.

## 2022-02-03 NOTE — Care Management Important Message (Signed)
Important Message  Patient Details  Name: [name] [name] MRN: [id number] Date of Birth: November 06, 1980   Medicare Important Message Given:  Yes     Shelda Altes 02/03/2022, 8:30 AM

## 2022-02-04 LAB — POTASSIUM
Potassium: 5.5 mmol/L — ABNORMAL HIGH (ref 3.5–5.1)
Potassium: 4.5 mmol/L (ref 3.5–5.1)
Potassium: 5.1 mmol/L (ref 3.5–5.1)

## 2022-02-04 LAB — BASIC METABOLIC PANEL
Anion gap: 11 (ref 5–15)
BUN: 56 mg/dL — ABNORMAL HIGH (ref 6–20)
CO2: 26 mmol/L (ref 22–32)
Calcium: 9.5 mg/dL (ref 8.9–10.3)
Chloride: 96 mmol/L — ABNORMAL LOW (ref 98–111)
Creatinine, Ser: 6.79 mg/dL — ABNORMAL HIGH (ref 0.44–1.00)
GFR, Estimated: 7 mL/min — ABNORMAL LOW (ref >60)
Glucose, Bld: 111 mg/dL — ABNORMAL HIGH (ref 70–99)
Potassium: 6.5 mmol/L (ref 3.5–5.1)
Sodium: 133 mmol/L — ABNORMAL LOW (ref 135–145)

## 2022-02-04 MED ORDER — ALPRAZOLAM 0.5 MG PO TABS
ORAL_TABLET | ORAL | Status: AC
  Administered 2022-02-04: 0.5 mg via ORAL
  Filled 2022-02-04: qty 1

## 2022-02-04 MED ORDER — CHLORHEXIDINE GLUCONATE CLOTH 2 % EX PADS
6.0000 | MEDICATED_PAD | Freq: Every day | CUTANEOUS | Status: DC
  Administered 2022-02-04 – 2022-02-05 (×2): 6 via TOPICAL

## 2022-02-04 MED ORDER — OXYCODONE-ACETAMINOPHEN 5-325 MG PO TABS
1.0000 | ORAL_TABLET | Freq: Four times a day (QID) | ORAL | Status: DC
  Administered 2022-02-04 – 2022-02-05 (×6): 1 via ORAL
  Filled 2022-02-04 (×6): qty 1

## 2022-02-04 MED ORDER — LORAZEPAM 2 MG/ML IJ SOLN
1.0000 mg | Freq: Once | INTRAMUSCULAR | Status: AC | PRN
  Administered 2022-02-04: 1 mg via INTRAVENOUS
  Filled 2022-02-04: qty 1

## 2022-02-04 MED ORDER — INSULIN ASPART 100 UNIT/ML IV SOLN
5.0000 [IU] | Freq: Once | INTRAVENOUS | Status: AC
Start: 2022-02-04 — End: 2022-02-04
  Administered 2022-02-04: 5 [IU] via INTRAVENOUS

## 2022-02-04 MED ORDER — DEXTROSE 50 % IV SOLN
1.0000 | Freq: Once | INTRAVENOUS | Status: AC
  Administered 2022-02-04: 50 mL via INTRAVENOUS
  Filled 2022-02-04: qty 50

## 2022-02-04 MED ORDER — SODIUM ZIRCONIUM CYCLOSILICATE 10 G PO PACK
10.0000 g | PACK | Freq: Once | ORAL | Status: AC
  Administered 2022-02-04: 10 g via ORAL
  Filled 2022-02-04: qty 1

## 2022-02-04 MED ORDER — DULOXETINE HCL 20 MG PO CPEP
20.0000 mg | ORAL_CAPSULE | Freq: Every day | ORAL | Status: DC
  Administered 2022-02-04 – 2022-02-05 (×2): 20 mg via ORAL
  Filled 2022-02-04 (×2): qty 1

## 2022-02-04 MED ORDER — PANTOPRAZOLE SODIUM 40 MG PO TBEC
40.0000 mg | DELAYED_RELEASE_TABLET | Freq: Every day | ORAL | Status: DC
  Administered 2022-02-04 – 2022-02-05 (×2): 40 mg via ORAL
  Filled 2022-02-04 (×2): qty 1

## 2022-02-04 NOTE — Progress Notes (Signed)
Critical lab value K+ 6.5. MD notified. Awaiting orders. Fuller Canada, RN

## 2022-02-04 NOTE — Progress Notes (Signed)
Notified of potassium 6.5 this am. Plan to check EKG, treat with Lokelma and insulin with dextrose, and also give calcium and bicarb if EKG changes.

## 2022-02-04 NOTE — Progress Notes (Signed)
[facility] Progress Note  Subjective: seen in room. Signed off early yesterday from HD. Having headaches and chest pain w/ HD.   Vitals:   02/03/22 2030 02/03/22 2335 02/04/22 0558 02/04/22 0825  BP: (!) 192/103 (!) 153/70 (!) 170/84 (!) 151/78  Pulse: (!) 108 100 97 99  Resp: '17 19 16 20  '$ Temp: 98.6 F (37 C) 98.3 F (36.8 C) 98.2 F (36.8 C) 98.2 F (36.8 C)  TempSrc: Oral Oral Oral Oral  SpO2: 98% 96% 100% 99%  Weight:      Height:        Exam: Gen alert No jvd or bruits Chest clear bilat to bases RRR no RG Abd soft ntnd no mass or ascites +bs Ext no LE edema Neuro is alert, Ox 3 , nf  RUA AVF+bruit         Home meds include - norvasc 10, asa, auryxia 3 ac, phoslyra 3 ac, catapres patch 0.1 weekly, cyclosporine 200 bid, ergocalciferol, lasix 20 qd, losartan 100, mycophenolate 540 bid, narcan prn, percocet, prednisone 15 qd, lokelma 10 gm prn     CXR - bilat vasc congestion, early bilat IS edema      OP HD: MWF AF  3h 19mn  53.5kg  400/500   2/2 bath  RUA AVF  Hep none - last HD 5/26, 57.6 > 54.4kg - last Hb 10.6 on 5/24 - mircera 200 q2, last 5/24, due 6/07 - hectorol 8 ug tiw IV - sensipar 90 po tiw   Assessment/ Plan: HTN'sive crisis - w/ chest pains, headache.  BP's better. Increased catapres patch to 0.2 and ^hydralazine to 75 tid. Also getting norvasc, losartan. Added coreg 6/01. Titrate as needed. Hyperkalemia - K+ came down to 4s yest afternoon, but is up again at 6.3 this am. Getting lokelma and ins/ glucose IV. Will need HD again today.  ESRD - on HD MWF. Extra HD today for ^K+.  Volume - f/u CXR improved, not sure vol status. No edema but up by wts, on RA. UFG 2.5 L as tolerated.  Hx renal (failed) / pancreas transplant - on cyclosporine, mycophenolate and prednisone Narcotic dependence - per pmd Anxiety - per pmd Chronic pain syndrome  - this is significant active issue Anemia ckd - Hb 11.4, no esa needs MBD ckd - CCa in range.  Phos a bit high. Cont binders x 2 and IV vdra     Rob Jonia Oakey 02/04/2022, 9:50 AM  Inpatient medications:  ALPRAZolam  0.5 mg Oral TID   amLODipine  10 mg Oral Daily   aspirin  81 mg Oral Daily   calcium acetate  2,668 mg Oral TID WC   carvedilol  12.5 mg Oral BID WC   Chlorhexidine Gluconate Cloth  6 each Topical Q0600   Chlorhexidine Gluconate Cloth  6 each Topical Q0600   cloNIDine  0.2 mg Transdermal Weekly   cycloSPORINE modified  200 mg Oral BID   doxercalciferol  6 mcg Intravenous Q M,W,F-HD   DULoxetine  20 mg Oral Daily   ferric citrate  630 mg Oral TID   heparin  5,000 Units Subcutaneous Q8H   hydrALAZINE  75 mg Oral Q8H   losartan  100 mg Oral Daily   mycophenolate  540 mg Oral BID   predniSONE  15 mg Oral Daily   sodium chloride flush  3 mL Intravenous Q12H    promethazine (PHENERGAN) injection (IM or IVPB) 12.5 mg (02/03/22 2343)    acetaminophen **OR** acetaminophen, albuterol,  calcium carbonate (dosed in mg elemental calcium), camphor-menthol **AND** hydrOXYzine, docusate sodium, feeding supplement (NEPRO CARB STEADY), hydrALAZINE, labetalol, LORazepam, promethazine (PHENERGAN) injection (IM or IVPB), sorbitol, zolpidem

## 2022-02-04 NOTE — Progress Notes (Signed)
PROGRESS NOTE  [name] [name]  DOB: 1980/10/20  PCP: [facility] [place] [facility]G:[id number]  [place]: 01/30/2022  LOS: 4 days  Hospital Day: 6  Brief narrative: [name] [name] is a 41 y.o. female with PMH significant for ESRD-HD-MWF, h/o renal/pancreatic transplant (kidney failed, pancreas still working on IS x 3), HTN, type I DM, gastroparesis, chronic pain under the care of pain management specialist, also with alleged history of narcotic abuse. Patient presented to the ED on 5/29 with complaint of worsening chest pain, abdominal pain, headache associated with persistently elevated blood pressure for more than a week..  She also complained of palpitation, diarrhea, anxiety.  Reports compliance to blood pressure medications and dialysis. In the ED, CTA chest was negative for dissection.  Chest x-ray showed cardiomegaly with vascular congestion. Patient was admitted to hospitalist service Nephrology was consulted. See below for details.  Subjective: Patient was seen and examined this morning  Sitting up in bed.  Having reflux, nausea.  Continues to have chronic pain.  Anxiety intermittently flares up Labs from this morning with potassium elevated to 6.5.  Improved to 5.5 on repeat check this morning  Principal Problem:   Hypertensive crisis Active Problems:   ESRD on dialysis [facility])   Pancreas transplant status ([place])   Type 1 diabetes ([place])   Narcotic dependence ([place])   Hypertensive emergency    Assessment and Plan: Hypertensive crisis -Patient presented with severe abdominal pain, chest pain and headache with markedly uncontrolled HTN, concerning for hypertensive crisis. -CT chest abdomen pelvis was negative for any dissection or any other acute intra-abdominal or pelvic pathology.   -Blood pressure continues to fluctuate.  But overall gradually improving, 160s this morning.   -Continue monitor on clonidine, losartan, hydralazine, amlodipine.  Hyperkalemia -Potassium level  elevated since yesterday.  Was dialyzed yesterday with improvement but increased again to 6.5 this morning.  Repeat potassium 5.5.  Noted a plan from nephrology to dialyze again today Recent Labs  Lab 01/31/22 0921 02/02/22 0832 02/03/22 0358 02/03/22 1307 02/04/22 0432 02/04/22 0939  K  --  PATIENT IDENTIFICATION ERROR. PLEASE DISREGARD RESULTS. ACCOUNT WILL BE CREDITED. 6.3* 4.0 6.5* 5.5*  PHOS 7.4*  --   --   --   --   --      Intractable chest/abdominal pain History of chronic pain Anxiety -Patient has history of chronic pain, anxiety and follows up with pain management specialist as an outpatient. -Currently she is on oral oxycodone 3 times daily for pain control.  For anxiety, she is also on scheduled Xanax and as needed IV Ativan.  I also added scheduled Cymbalta today.  PRN IV Phenergan for nausea. -Patient had last EGD on 12/03/2021.  She had erythematous duodenopathy.  No ulcers or bleeding. -Hemoglobin stable.  ESRD-HD-MWF -Dialysis on schedule.     History of renal and pancreatic transplant -Unfortunately her kidney failed, pancreas still working on IS x 3 -Continue mycophenolate, cyclosporine, prednisone -Last A1c 4.1 on 11/11/2021. -Currently on sliding scale.  Goals of care   Code Status: Full Code    Mobility: Encourage ambulation  Skin assessment:     Nutritional status:  Body mass index is 22.06 kg/m.          Diet:  Diet Order             Diet renal with fluid restriction Fluid restriction: 1200 mL Fluid; Room service appropriate? Yes; Fluid consistency: Thin  Diet effective now  DVT prophylaxis:  Place and maintain sequential compression device Start: 01/31/22 1906 heparin injection 5,000 Units Start: 01/30/22 0915   Antimicrobials: None Fluid: None Consultants: Nephrology Family Communication: None at bedside  Status is: Inpatient  Continue in-hospital care because: Hard to control anxiety.  Constantly  requiring IV Ativan blood pressure continues to remain elevated Level of care: Telemetry Medical   Dispo: The patient is from: Home              Anticipated d/c is to: Home in 1 to 2 days              Patient currently is not medically stable to d/c.   Difficult to place patient No     Infusions:   promethazine (PHENERGAN) injection (IM or IVPB) 12.5 mg (02/03/22 2343)     Scheduled Meds:  ALPRAZolam  0.5 mg Oral TID   amLODipine  10 mg Oral Daily   aspirin  81 mg Oral Daily   calcium acetate  2,668 mg Oral TID WC   carvedilol  12.5 mg Oral BID WC   Chlorhexidine Gluconate Cloth  6 each Topical Q0600   Chlorhexidine Gluconate Cloth  6 each Topical Q0600   Chlorhexidine Gluconate Cloth  6 each Topical Q0600   cloNIDine  0.2 mg Transdermal Weekly   cycloSPORINE modified  200 mg Oral BID   doxercalciferol  6 mcg Intravenous Q M,W,F-HD   DULoxetine  20 mg Oral Daily   ferric citrate  630 mg Oral TID   heparin  5,000 Units Subcutaneous Q8H   hydrALAZINE  75 mg Oral Q8H   losartan  100 mg Oral Daily   mycophenolate  540 mg Oral BID   oxyCODONE-acetaminophen  1 tablet Oral Q6H   pantoprazole  40 mg Oral Daily   predniSONE  15 mg Oral Daily   sodium chloride flush  3 mL Intravenous Q12H    PRN meds: acetaminophen **OR** acetaminophen, albuterol, calcium carbonate (dosed in mg elemental calcium), camphor-menthol **AND** hydrOXYzine, docusate sodium, feeding supplement (NEPRO CARB STEADY), hydrALAZINE, labetalol, LORazepam, promethazine (PHENERGAN) injection (IM or IVPB), sorbitol, zolpidem   Antimicrobials: Anti-infectives (From admission, onward)    None       Objective: Vitals:   02/04/22 1027 02/04/22 1245  BP:  (!) 168/84  Pulse: 96 92  Resp:  20  Temp:  98.5 F (36.9 C)  SpO2:  97%    Intake/Output Summary (Last 24 hours) at 02/04/2022 1430 Last data filed at 02/03/2022 2031 Gross per 24 hour  Intake 480 ml  Output 0 ml  Net 480 ml   Filed Weights    02/01/22 0400 02/02/22 0429 02/03/22 1029  Weight: 57.1 kg 56.5 kg 58.3 kg   Weight change:  Body mass index is 22.06 kg/m.   Physical Exam: General exam: Pleasant, young female.   Skin: No rashes, lesions or ulcers. HEENT: Atraumatic, normocephalic, no obvious bleeding. Lungs: Clear to auscultation bilaterally CVS: Regular rate and rhythm, no murmur GI/Abd soft, mild diffuse tenderness, nondistended, bowel sound present CNS: Alert, awake, oriented x3 Psychiatry: Anxious Extremities: No pedal edema, no calf tenderness  Data Review: I have personally reviewed the laboratory data and studies available.  F/u labs  Unresulted Labs (From admission, onward)     Start     Ordered   02/04/22 1000  Potassium  STAT Now then every 4 hours ,   R     Comments: Until normal twice.   Question:  Specimen collection method  Answer:  Lab=Lab collect   02/04/22 0540            Signed, Terrilee Croak, MD [name] 02/04/2022

## 2022-02-05 LAB — BASIC METABOLIC PANEL
Anion gap: 15 (ref 5–15)
BUN: 43 mg/dL — ABNORMAL HIGH (ref 6–20)
CO2: 27 mmol/L (ref 22–32)
Calcium: 10.1 mg/dL (ref 8.9–10.3)
Chloride: 93 mmol/L — ABNORMAL LOW (ref 98–111)
Creatinine, Ser: 5.77 mg/dL — ABNORMAL HIGH (ref 0.44–1.00)
GFR, Estimated: 9 mL/min — ABNORMAL LOW (ref >60)
Glucose, Bld: 125 mg/dL — ABNORMAL HIGH (ref 70–99)
Potassium: 5.6 mmol/L — ABNORMAL HIGH (ref 3.5–5.1)
Sodium: 135 mmol/L (ref 135–145)

## 2022-02-05 MED ORDER — PANTOPRAZOLE SODIUM 40 MG PO TBEC: 40.0000 mg | DELAYED_RELEASE_TABLET | Freq: Every day | ORAL | 2 refills | Status: DC

## 2022-02-05 MED ORDER — HYDRALAZINE HCL 25 MG PO TABS: 75.0000 mg | ORAL_TABLET | Freq: Three times a day (TID) | ORAL | 2 refills | Status: DC

## 2022-02-05 MED ORDER — AMLODIPINE BESYLATE 10 MG PO TABS: 10.0000 mg | ORAL_TABLET | Freq: Every day | ORAL | 2 refills | Status: DC

## 2022-02-05 MED ORDER — DULOXETINE HCL 20 MG PO CPEP: 20.0000 mg | ORAL_CAPSULE | Freq: Every day | ORAL | 2 refills | Status: DC

## 2022-02-05 MED ORDER — SODIUM ZIRCONIUM CYCLOSILICATE 10 G PO PACK: 10.0000 g | PACK | Freq: Two times a day (BID) | ORAL | Status: DC

## 2022-02-05 MED ORDER — OXYCODONE-ACETAMINOPHEN 5-325 MG PO TABS: 1.0000 | ORAL_TABLET | Freq: Four times a day (QID) | ORAL | 0 refills | Status: AC

## 2022-02-05 MED ORDER — CALCIUM CARBONATE ANTACID 1250 MG/5ML PO SUSP: 500.0000 mg | Freq: Four times a day (QID) | ORAL | 0 refills | Status: AC | PRN

## 2022-02-05 MED ORDER — SODIUM ZIRCONIUM CYCLOSILICATE 10 G PO PACK
10.0000 g | PACK | Freq: Every day | ORAL | Status: AC
  Administered 2022-02-05: 10 g via ORAL
  Filled 2022-02-05: qty 1

## 2022-02-05 MED ORDER — LORAZEPAM 0.5 MG PO TABS: 1.0000 mg | ORAL_TABLET | Freq: Four times a day (QID) | ORAL | 0 refills | Status: AC | PRN

## 2022-02-05 MED ORDER — CLONIDINE 0.2 MG/24HR TD PTWK: 0.2000 mg | MEDICATED_PATCH | TRANSDERMAL | 2 refills | Status: AC

## 2022-02-05 MED ORDER — CARVEDILOL 12.5 MG PO TABS: 12.5000 mg | ORAL_TABLET | Freq: Two times a day (BID) | ORAL | 2 refills | Status: DC

## 2022-02-05 MED ORDER — CALCIUM ACETATE (PHOS BINDER) 667 MG PO CAPS: 2668.0000 mg | ORAL_CAPSULE | Freq: Three times a day (TID) | ORAL | 2 refills | Status: DC

## 2022-02-05 NOTE — Progress Notes (Signed)
Pt discharged to home with friend.  Pt taken off telemetry and CCMD notified.  Pt's IV removed.  Pt left with all of their personal belongings.  AVS documentation reviewed and sent home with Pt and all questions answered.

## 2022-02-05 NOTE — Progress Notes (Signed)
[facility] Progress Note  Subjective: seen in room. Got full HD yest w/ 3 L off. Multiple pain c/o's but overall she appears a lot better.   Vitals:   02/05/22 0342 02/05/22 0753 02/05/22 1046 02/05/22 1159  BP: (!) 150/77 (!) 148/72  (!) 154/79  Pulse: 92 87 94 86  Resp: '20 17  16  '$ Temp: 98.3 F (36.8 C) 98.3 F (36.8 C)  98.1 F (36.7 C)  TempSrc: Oral Oral  Oral  SpO2: 94% 99%  99%  Weight:      Height:        Exam: Gen alert No jvd or bruits Chest clear bilat to bases RRR no RG Abd soft ntnd no mass or ascites +bs Ext no LE edema Neuro is alert, Ox 3 , nf  RUA AVF+bruit         Home meds include - norvasc 10, asa, auryxia 3 ac, phoslyra 3 ac, catapres patch 0.1 weekly, cyclosporine 200 bid, ergocalciferol, lasix 20 qd, losartan 100, mycophenolate 540 bid, narcan prn, percocet, prednisone 15 qd, lokelma 10 gm prn     CXR - bilat vasc congestion, early bilat IS edema      OP HD: MWF AF  3h 5mn  53.5kg  400/500   2/2 bath  RUA AVF  Hep none - last HD 5/26, 57.6 > 54.4kg - last Hb 10.6 on 5/24 - mircera 200 q2, last 5/24, due 6/07 - hectorol 8 ug tiw IV - sensipar 90 po tiw   Assessment/ Plan: HTN'sive crisis - w/ chest pains, headache. Increased catapres patch to 0.2, increased hydralazine to 75 tid and added coreg 6/01. Also getting norvasc, losartan. Volume better, only 2kg over today. BP's better.  Hyperkalemia - K+ better today at 5.4. Okay for dc, will get HD tomorrow and takes lokelma at home on non HD days.  ESRD - on HD MWF. Extra HD yesterday . Next HD as OP tomorrow.  Volume - vol overload, was up 5-6kg max here, leaving 2kg over, would keep her prior dry wt upon dc Hx renal (failed) / pancreas transplant - on cyclosporine, mycophenolate and prednisone Narcotic dependence - per pmd Anxiety - per pmd Chronic pain syndrome  - this is significant issue Anemia ckd - Hb 11.4, no esa needs MBD ckd - CCa in range. Phos a bit high. Cont  binders x 2 and IV vdra     Rob Lior Cartelli 02/05/2022, 2:59 PM  Inpatient medications:  ALPRAZolam  0.5 mg Oral TID   amLODipine  10 mg Oral Daily   aspirin  81 mg Oral Daily   calcium acetate  2,668 mg Oral TID WC   carvedilol  12.5 mg Oral BID WC   Chlorhexidine Gluconate Cloth  6 each Topical Q0600   Chlorhexidine Gluconate Cloth  6 each Topical Q0600   Chlorhexidine Gluconate Cloth  6 each Topical Q0600   cloNIDine  0.2 mg Transdermal Weekly   cycloSPORINE modified  200 mg Oral BID   doxercalciferol  6 mcg Intravenous Q M,W,F-HD   DULoxetine  20 mg Oral Daily   ferric citrate  630 mg Oral TID   heparin  5,000 Units Subcutaneous Q8H   hydrALAZINE  75 mg Oral Q8H   losartan  100 mg Oral Daily   mycophenolate  540 mg Oral BID   oxyCODONE-acetaminophen  1 tablet Oral Q6H   pantoprazole  40 mg Oral Daily   predniSONE  15 mg Oral Daily   sodium chloride flush  3 mL Intravenous Q12H    promethazine (PHENERGAN) injection (IM or IVPB) 12.5 mg (02/05/22 0937)    acetaminophen **OR** acetaminophen, albuterol, calcium carbonate (dosed in mg elemental calcium), camphor-menthol **AND** hydrOXYzine, docusate sodium, feeding supplement (NEPRO CARB STEADY), hydrALAZINE, labetalol, LORazepam, promethazine (PHENERGAN) injection (IM or IVPB), sorbitol, zolpidem

## 2022-02-05 NOTE — Discharge Summary (Signed)
Physician Discharge Summary  Teva Bronkema Pressley MGQ:[id number] DOB: 1981-04-09 DOA: 01/30/2022  PCP: [name]  Admit date: 01/30/2022 Discharge date: 02/05/2022  Admitted From: Home Discharge disposition: Home   Brief narrative: [name] [name] is a 41 y.o. female with PMH significant for ESRD-HD-MWF, h/o renal/pancreatic transplant (kidney failed, pancreas still working on IS x 3), HTN, type I DM, gastroparesis, chronic pain under the care of pain management specialist, also with alleged history of narcotic abuse. Patient presented to the ED on 5/29 with complaint of worsening chest pain, abdominal pain, headache associated with persistently elevated blood pressure for more than a week..  She also complained of palpitation, diarrhea, anxiety.  Reports compliance to blood pressure medications and dialysis. In the ED, CTA chest was negative for dissection.  Chest x-ray showed cardiomegaly with vascular congestion. Patient was admitted to hospitalist service Nephrology was consulted. See below for details.  Subjective: Patient was seen and examined this morning.  Sitting up in bed.  Not in distress.   Underwent dialysis yesterday for high potassium level. Potassium this morning 5.6.  She is due for her regular dialysis tomorrow morning. Anxiety improving with IV Ativan, Cymbalta.    Principal Problem:   Hypertensive crisis Active Problems:   ESRD on dialysis [facility])   Pancreas transplant status ([place])   Type 1 diabetes ([place])   Narcotic dependence ([place])   Hypertensive emergency    Assessment and Plan: Hypertensive crisis -Patient presented with severe abdominal pain, chest pain and headache with markedly uncontrolled HTN, concerning for hypertensive crisis. -CT chest abdomen pelvis was negative for any dissection or any other acute intra-abdominal or pelvic pathology.   -Blood pressure continues to fluctuate.  But overall gradually improving, and 150s this  morning. -Continue current regimen with clonidine, losartan, hydralazine, amlodipine at discharge.  Hyperkalemia -Potassium level was as high as 6.5 yesterday.  Dialyzed with improvement.  Potassium at 5.6 this morning.  She is due for dialysis as an outpatient tomorrow morning.  -Patient also takes Lasix 20 mg and Lokelma 1 dose daily on nondialysis days. Recent Labs  Lab 01/31/22 0921 02/02/22 0832 02/04/22 0432 02/04/22 0939 02/04/22 1829 02/04/22 2201 02/05/22 0137  K  --    < > 6.5* 5.5* 4.5 5.1 5.6*  PHOS 7.4*  --   --   --   --   --   --    < > = values in this interval not displayed.    Intractable chest/abdominal pain History of chronic pain Anxiety -Patient has history of chronic pain, anxiety and follows up with pain management specialist as an outpatient. -Currently she is on oral oxycodone 3 times daily for pain control.  For anxiety, she is on scheduled Cymbalta and as needed Xanax.  I will continue that regimen at discharge.   -Patient had last EGD on 12/03/2021.  She had erythematous duodenopathy.  No ulcers or bleeding. -Hemoglobin stable.  ESRD-HD-MWF -Dialysis on schedule.     History of renal and pancreatic transplant -Unfortunately her kidney failed, pancreas still working on IS x 3 -Continue mycophenolate, cyclosporine, prednisone -Last A1c 4.1 on 11/11/2021. -Currently on sliding scale.  Goals of care   Code Status: Full Code    Mobility: Encourage ambulation  Skin assessment:     Nutritional status:  Body mass index is 21.08 kg/m.           Wounds:  - Incision (Closed) 12/08/20 Chest Right (Active)  Date First Assessed/Time First Assessed: 12/08/20 1408  Location: Chest  Location Orientation: Right    Assessments 12/08/2020  3:23 PM 12/08/2020  4:26 PM  Dressing Type Liquid skin adhesive --  Dressing -- Clean;Dry;Intact  Site / Wound Assessment Clean;Dry Clean;Dry  Margins Attached edges (approximated) --  Closure Sutures;Skin glue --   Drainage Amount None None     No Linked orders to display     Incision (Closed) 12/08/20 Arm Right (Active)  Date First Assessed/Time First Assessed: 12/08/20 1408   Location: Arm  Location Orientation: Right    Assessments 12/08/2020  3:23 PM 12/08/2020  4:26 PM  Dressing Type Compression wrap;Gauze (Comment);Pressure dressing --  Dressing Clean;Dry;Intact Clean;Dry;Intact  Site / Wound Assessment Dressing in place / Unable to assess;Clean;Dry Dressing in place / Unable to assess;Clean;Dry  Margins Attached edges (approximated) --  Closure Sutures --  Drainage Amount None None  Treatment Off loading --     No Linked orders to display     Incision (Closed) 02/25/21 Chest Right (Active)  Date First Assessed/Time First Assessed: 02/25/21 2009   Location: Chest  Location Orientation: Right    Assessments 02/25/2021  8:20 PM 02/26/2021  8:00 AM  Dressing Type Liquid skin adhesive;Gauze (Comment) Gauze (Comment)  Dressing Clean;Dry;Intact Clean;Dry;Intact  Site / Wound Assessment Dressing in place / Unable to assess Dressing in place / Unable to assess  Margins -- Attached edges (approximated)  Closure Sutures Approximated;Sutures  Drainage Amount None None     No Linked orders to display     Incision (Closed) 05/03/21 Chest Right (Active)  Date First Assessed/Time First Assessed: 05/03/21 1214   Location: Chest  Location Orientation: Right  Present on Admission: Yes    Assessments 05/03/2021 12:14 PM  Dressing Type Gauze (Comment);Transparent dressing  Dressing Dry;Intact  Dressing Change Frequency PRN  Site / Wound Assessment Clean;Dry  Margins Attached edges (approximated)  Closure Approximated  Drainage Amount None  Treatment Cleansed     No Linked orders to display    Discharge Exam:   Vitals:   02/05/22 0342 02/05/22 0753 02/05/22 1046 02/05/22 1159  BP: (!) 150/77 (!) 148/72  (!) 154/79  Pulse: 92 87 94 86  Resp: '20 17  16  ' Temp: 98.3 F (36.8 C) 98.3 F (36.8 C)   98.1 F (36.7 C)  TempSrc: Oral Oral  Oral  SpO2: 94% 99%  99%  Weight:      Height:        Body mass index is 21.08 kg/m.  General exam: Pleasant, young female.   Skin: No rashes, lesions or ulcers. HEENT: Atraumatic, normocephalic, no obvious bleeding. Lungs: Clear to auscultation bilaterally CVS: Regular rate and rhythm, no murmur GI/Abd soft, mild diffuse tenderness, nondistended, bowel sound present CNS: Alert, awake, oriented x3 Psychiatry: Improving anxiety Extremities: No pedal edema, no calf tenderness  Follow ups:    [facility] Follow up.   Contact information: [place] [address] [phone]                 Discharge Instructions:   Discharge Instructions     Call MD for:  difficulty breathing, headache or visual disturbances   Complete by: As directed    Call MD for:  extreme fatigue   Complete by: As directed    Call MD for:  hives   Complete by: As directed    Call MD for:  persistant dizziness or light-headedness   Complete by: As directed  Call MD for:  persistant nausea and vomiting   Complete by: As directed    Call MD for:  severe uncontrolled pain   Complete by: As directed    Call MD for:  temperature >100.4   Complete by: As directed    Diet general   Complete by: As directed    Renal diet.  Potassium restricted diet   Discharge instructions   Complete by: As directed    General discharge instructions: [facility] with [facility] [place] in 7 days  Please request your PCP  to go over your hospital tests, procedures, radiology results at the follow up. Please get your medicines reviewed and adjusted.  Your PCP may decide to repeat certain labs or tests as needed. Do not drive, operate heavy machinery, perform activities at heights, swimming or participation in water activities or provide baby sitting services if your were admitted for syncope or siezures until  you have seen by Primary MD or a Neurologist and advised to do so again. [place] Controlled Substance Reporting System database was reviewed. Do not drive, operate heavy machinery, perform activities at heights, swim, participate in water activities or provide baby-sitting services while on medications for pain, sleep and mood until your outpatient physician has reevaluated you and advised to do so again.  You are strongly recommended to comply with the dose, frequency and duration of prescribed medications. Activity: As tolerated with Full fall precautions use walker/cane & assistance as needed Avoid using any recreational substances like cigarette, tobacco, alcohol, or non-prescribed drug. If you experience worsening of your admission symptoms, develop shortness of breath, life threatening emergency, suicidal or homicidal thoughts you must seek medical attention immediately by calling 911 or calling your MD immediately  if symptoms less severe. You must read complete instructions/literature along with all the possible adverse reactions/side effects for all the medicines you take and that have been prescribed to you. Take any new medicine only after you have completely understood and accepted all the possible adverse reactions/side effects.  Wear Seat belts while driving. You were cared for by a hospitalist during your hospital stay. If you have any questions about your discharge medications or the care you received while you were in the hospital after you are discharged, you can call the unit and ask to speak with the hospitalist or the covering physician. Once you are discharged, your primary care physician will handle any further medical issues. Please note that NO REFILLS for any discharge medications will be authorized once you are discharged, as it is imperative that you return to your primary care physician (or establish a relationship with a primary care physician if you do not have one).    Increase activity slowly   Complete by: As directed        Discharge Medications:   Allergies as of 02/05/2022   No Known Allergies      Medication List     STOP taking these medications    Auryxia 1 GM 210 MG(Fe) tablet Generic drug: ferric citrate   calcium acetate (Phos Binder) 667 MG/5ML Soln Commonly known as: PHOSLYRA Replaced by: calcium acetate 667 MG capsule   Catapres-TTS-1 0.1 mg/24hr patch Generic drug: cloNIDine Replaced by: cloNIDine 0.2 mg/24hr patch       TAKE these medications    amLODipine 10 MG tablet Commonly known as: NORVASC Take 1 tablet (10 mg total) by mouth daily.   aspirin 81 MG chewable tablet Chew 81 mg by mouth daily.  calcium acetate 667 MG capsule Commonly known as: PHOSLO Take 4 capsules (2,668 mg total) by mouth 3 (three) times daily with meals. Replaces: calcium acetate (Phos Binder) 667 MG/5ML Soln   calcium carbonate (dosed in mg elemental calcium) 1250 MG/5ML Susp Take 5 mLs (500 mg of elemental calcium total) by mouth every 6 (six) hours as needed for indigestion.   carvedilol 12.5 MG tablet Commonly known as: COREG Take 1 tablet (12.5 mg total) by mouth 2 (two) times daily with a meal.   cloNIDine 0.2 mg/24hr patch Commonly known as: CATAPRES - Dosed in mg/24 hr Place 1 patch (0.2 mg total) onto the skin once a week. Start taking on: February 08, 2022 Replaces: Catapres-TTS-1 0.1 mg/24hr patch   cycloSPORINE modified 25 MG capsule Commonly known as: NEORAL Take 25 mg by mouth daily.   cycloSPORINE modified 100 MG capsule Commonly known as: NEORAL Take 2 capsules (200 mg total) by mouth 2 (two) times daily.   Darbepoetin Alfa 60 MCG/0.3ML Sosy injection Commonly known as: ARANESP Inject 0.3 mLs (60 mcg total) into the vein every Friday with hemodialysis.   doxercalciferol 4 MCG/2ML injection Commonly known as: HECTOROL Inject 3 mLs (6 mcg total) into the vein every Monday, Wednesday, and Friday with  hemodialysis.   DULoxetine 20 MG capsule Commonly known as: CYMBALTA Take 1 capsule (20 mg total) by mouth daily. Start taking on: February 06, 2022   ergocalciferol 1.25 MG (50000 UT) capsule Commonly known as: VITAMIN D2 Take 50,000 Units by mouth once a week.   furosemide 20 MG tablet Commonly known as: LASIX Take 20 mg by mouth daily. Take on non-HD days   hydrALAZINE 25 MG tablet Commonly known as: APRESOLINE Take 3 tablets (75 mg total) by mouth every 8 (eight) hours.   lidocaine-prilocaine cream Commonly known as: EMLA Apply 1 application topically daily as needed (port access).   Lokelma 10 g Pack packet Generic drug: sodium zirconium cyclosilicate Take 10 g by mouth as directed. Take on Non-Dialysis (Sun,Tues, Thurs, Sat)   LORazepam 0.5 MG tablet Commonly known as: ATIVAN Take 2 tablets (1 mg total) by mouth every 6 (six) hours as needed for up to 5 days for anxiety.   losartan 100 MG tablet Commonly known as: Cozaar Take 1 tablet (100 mg total) by mouth daily.   mycophenolate 180 MG EC tablet Commonly known as: MYFORTIC Take 540 mg by mouth 2 (two) times daily.   naloxone 4 MG/0.1ML Liqd nasal spray kit Commonly known as: NARCAN Place 1 spray into the nose as needed (opioid overdose).   oxyCODONE-acetaminophen 5-325 MG tablet Commonly known as: PERCOCET/ROXICET Take 1 tablet by mouth every 6 (six) hours for 5 days. What changed:  how much to take when to take this reasons to take this   pantoprazole 40 MG tablet Commonly known as: PROTONIX Take 1 tablet (40 mg total) by mouth daily. Start taking on: February 06, 2022   predniSONE 5 MG tablet Commonly known as: DELTASONE Take 15 mg by mouth daily.         The results of significant diagnostics from this hospitalization (including imaging, microbiology, ancillary and laboratory) are listed below for reference.    Procedures and Diagnostic Studies:   DG Abd 1 View  Result Date: 01/30/2022 CLINICAL  DATA:  Abdominal pain, nausea EXAM: ABDOMEN - 1 VIEW COMPARISON:  11/07/2021 FINDINGS: Supine frontal view of the abdomen and pelvis demonstrates an unremarkable bowel gas pattern. Vascular calcifications are seen throughout the abdomen and  pelvis. No masses or abnormal calcifications. No acute bony abnormalities. IMPRESSION: 1. Unremarkable bowel gas pattern. Electronically Signed   By: Randa Ngo M.D.   On: 01/30/2022 22:51   CT HEAD WO CONTRAST (5MM)  Result Date: 01/30/2022 CLINICAL DATA:  41 year old female with history of syncope. Head trauma. Dizziness. EXAM: CT HEAD WITHOUT CONTRAST TECHNIQUE: Contiguous axial images were obtained from the base of the skull through the vertex without intravenous contrast. RADIATION DOSE REDUCTION: This exam was performed according to the departmental dose-optimization program which includes automated exposure control, adjustment of the mA and/or kV according to patient size and/or use of iterative reconstruction technique. COMPARISON:  Head CT 01/10/2022. FINDINGS: Brain: Well-defined areas of low attenuation in the medial left cerebellar hemisphere and lateral left cerebellar hemisphere, similar in comparison to prior examinations, compatible with old chronic infarcts. Patchy areas of decreased attenuation are noted throughout the deep and periventricular white matter of the cerebral hemispheres bilaterally, compatible with mild chronic microvascular ischemic disease. No evidence of acute infarction, hemorrhage, hydrocephalus, extra-axial collection or mass lesion/mass effect. Vascular: No hyperdense vessel or unexpected calcification. Skull: Normal. Negative for fracture or focal lesion. Sinuses/Orbits: No acute finding. Other: None. IMPRESSION: 1. No acute intracranial abnormalities. 2. 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 Electronically Signed   By:  Vinnie Langton M.D.   On: 01/30/2022 05:06   DG Chest Port 1 View  Result Date: 01/30/2022 CLINICAL DATA:  Chest pain. EXAM: PORTABLE CHEST 1 VIEW COMPARISON:  Chest x-ray 01/23/2022. FINDINGS: Heart is enlarged. There central pulmonary vascular congestion. There is no pleural effusion or pneumothorax. No acute fractures are seen. IMPRESSION: 1. Cardiomegaly with central pulmonary vascular congestion. Electronically Signed   By: Ronney Asters M.D.   On: 01/30/2022 03:28   CT Angio Chest/Abd/Pel for Dissection W and/or Wo Contrast  Result Date: 01/30/2022 CLINICAL DATA:  Chest or back pain, aortic dissection suspected. EXAM: CT ANGIOGRAPHY CHEST, ABDOMEN AND PELVIS TECHNIQUE: Non-contrast CT of the chest was initially obtained. Multidetector CT imaging through the chest, abdomen and pelvis was performed using the standard protocol during bolus administration of intravenous contrast. Multiplanar reconstructed images and MIPs were obtained and reviewed to evaluate the vascular anatomy. RADIATION DOSE REDUCTION: This exam was performed according to the departmental dose-optimization program which includes automated exposure control, adjustment of the mA and/or kV according to patient size and/or use of iterative reconstruction technique. CONTRAST:  1102m OMNIPAQUE IOHEXOL 350 MG/ML SOLN COMPARISON:  Abdominal CT 12/02/2021 FINDINGS: CTA CHEST FINDINGS Cardiovascular: Preferential opacification of the thoracic aorta. No evidence of thoracic aortic aneurysm or dissection. Enlarged heart size. Small volume low-density pericardial effusion. Extensive atheromatous calcification of the aorta and coronaries. Mediastinum/Nodes: Negative for adenopathy or mass. Coarse/benign-appearing calcification in the lateral right breast. Lungs/Pleura: Symmetric interlobular septal thickening and dependent patchy ground-glass opacity. No pleural effusion or pneumothorax Musculoskeletal: Benign-appearing lucency in the T9 vertebral  body. No acute or aggressive finding. Review of the MIP images confirms the above findings. CTA ABDOMEN AND PELVIS FINDINGS VASCULAR Aorta: Diffuse atheromatous calcification Celiac: Arterial calcification without stenosis or major branch occlusion SMA: Arterial calcification without proximal stenosis or major branch occlusion. Native renals: Extensive calcification. Bilateral stenosis which is difficult to quantify due to degree of calcified wall, likely flow limiting on both sides Transplant renal artery: Patent. IMA: Patent with extensive calcification Inflow: Diffuse atheromatous calcification. No stenosis or dissection. Veins: Unremarkable in the arterial phase. Pancreas  transplant with anastomosis at the right common iliac artery, unremarkable. Review of the MIP images confirms the above findings. NON-VASCULAR Hepatobiliary: No focal liver abnormality.No evidence of biliary obstruction or stone. Pancreas: Native and transplant pancreas appears unremarkable Spleen: Chronic enlargement with 14 cm anterior to posterior span. No focal abnormality Adrenals/Urinary Tract: Negative adrenals. Symmetric native renal atrophy. Unchanged appearance of small transplant kidney in the right iliac fossa. No hydronephrosis. Unremarkable bladder. Stomach/Bowel: Loop of bowel adjacent to pancreas transplant that measures up to 3.6 cm in diameter. Dilatation of a lesser degree was seen on a 2020 CT. Moderate distention of the stomach which could be from a recent meal or gastroparesis. Diffuse colonic stool without colonic obstructive pattern. No visible bowel inflammation Lymphatic: No mass or adenopathy. Reproductive:Interval dominant follicle on the left Other: No ascites or pneumoperitoneum. Musculoskeletal: No acute abnormalities. Review of the MIP images confirms the above findings. IMPRESSION: 1. Negative for acute aortic syndrome. 2. CHF. 3. Distended loop of small bowel adjacent to pancreas transplant, likely  postoperative aneurysmal dilatation when compared with prior imaging. 4. Full stomach which may be from a recent meal or history of gastro paresis. 5. Chronic findings are described above. Electronically Signed   By: Jorje Guild M.D.   On: 01/30/2022 06:04     Labs:   Basic Metabolic Panel: Recent Labs  Lab 01/31/22 0829 01/31/22 2585 02/02/22 2778 02/03/22 0358 02/03/22 1307 02/04/22 0432 02/04/22 0939 02/04/22 2201 02/05/22 0137  NA 140  --  PATIENT IDENTIFICATION ERROR. PLEASE DISREGARD RESULTS. ACCOUNT WILL BE CREDITED. 137  --  133*  --   --  135  K 4.7  --  PATIENT IDENTIFICATION ERROR. PLEASE DISREGARD RESULTS. ACCOUNT WILL BE CREDITED. 6.3*   < > 6.5*   < > 5.1 5.6*  CL 98  --  PATIENT IDENTIFICATION ERROR. PLEASE DISREGARD RESULTS. ACCOUNT WILL BE CREDITED. 97*  --  96*  --   --  93*  CO2 30  --  PATIENT IDENTIFICATION ERROR. PLEASE DISREGARD RESULTS. ACCOUNT WILL BE CREDITED. 25  --  26  --   --  27  GLUCOSE 115*  --  PATIENT IDENTIFICATION ERROR. PLEASE DISREGARD RESULTS. ACCOUNT WILL BE CREDITED. 106*  --  111*  --   --  125*  BUN 48*  --  PATIENT IDENTIFICATION ERROR. PLEASE DISREGARD RESULTS. ACCOUNT WILL BE CREDITED. 77*  --  56*  --   --  43*  CREATININE 7.60*  --  PATIENT IDENTIFICATION ERROR. PLEASE DISREGARD RESULTS. ACCOUNT WILL BE CREDITED. 8.89*  --  6.79*  --   --  5.77*  CALCIUM 9.1  --  PATIENT IDENTIFICATION ERROR. PLEASE DISREGARD RESULTS. ACCOUNT WILL BE CREDITED. 10.4*  --  9.5  --   --  10.1  PHOS  --  7.4*  --   --   --   --   --   --   --    < > = values in this interval not displayed.   GFR Estimated Creatinine Clearance: 11.2 mL/min (A) (by C-G formula based on SCr of 5.77 mg/dL (H)). Liver Function Tests: Recent Labs  Lab 01/30/22 0337  AST 10*  ALT 12  ALKPHOS 100  BILITOT 0.7  PROT 6.7  ALBUMIN 3.4*   Recent Labs  Lab 01/30/22 0337  LIPASE 39   No results for input(s): AMMONIA in the last 168 hours. Coagulation profile No  results for input(s): INR, PROTIME in the last 168 hours.  CBC: Recent Labs  Lab 01/30/22 0337 02/02/22 0832  WBC 5.6 PATIENT IDENTIFICATION ERROR. PLEASE DISREGARD RESULTS. ACCOUNT WILL BE CREDITED.  HGB 11.4* PATIENT IDENTIFICATION ERROR. PLEASE DISREGARD RESULTS. ACCOUNT WILL BE CREDITED.  HCT 36.6 PATIENT IDENTIFICATION ERROR. PLEASE DISREGARD RESULTS. ACCOUNT WILL BE CREDITED.  MCV 102.2* PATIENT IDENTIFICATION ERROR. PLEASE DISREGARD RESULTS. ACCOUNT WILL BE CREDITED.  PLT 126* PATIENT IDENTIFICATION ERROR. PLEASE DISREGARD RESULTS. ACCOUNT WILL BE CREDITED.   Cardiac Enzymes: No results for input(s): CKTOTAL, CKMB, CKMBINDEX, TROPONINI in the last 168 hours. BNP: Invalid input(s): POCBNP CBG: Recent Labs  Lab 02/03/22 0629  GLUCAP 109*   D-Dimer No results for input(s): DDIMER in the last 72 hours. Hgb A1c No results for input(s): HGBA1C in the last 72 hours. Lipid Profile No results for input(s): CHOL, HDL, LDLCALC, TRIG, CHOLHDL, LDLDIRECT in the last 72 hours. Thyroid function studies No results for input(s): TSH, T4TOTAL, T3FREE, THYROIDAB in the last 72 hours.  Invalid input(s): FREET3 Anemia work up No results for input(s): VITAMINB12, FOLATE, FERRITIN, TIBC, IRON, RETICCTPCT in the last 72 hours. Microbiology No results found for this or any previous visit (from the past 240 hour(s)).  Time coordinating discharge: 35 minutes  Signed: Mckay Brandt  [name] 02/05/2022, 12:43 PM

## 2022-02-23 ENCOUNTER — Emergency Department (HOSPITAL_COMMUNITY): Payer: Medicare Other

## 2022-02-23 ENCOUNTER — Encounter (HOSPITAL_COMMUNITY): Payer: Self-pay

## 2022-02-23 ENCOUNTER — Other Ambulatory Visit: Payer: Self-pay

## 2022-02-23 ENCOUNTER — Observation Stay (HOSPITAL_COMMUNITY)
Admission: EM | Admit: 2022-02-23 | Discharge: 2022-02-24 | Disposition: A | Discharge status: Home / Self Care | Payer: Medicare Other | Attending: Family Medicine | Admitting: Family Medicine

## 2022-02-23 DIAGNOSIS — Y636 Underdosing and nonadministration of necessary drug, medicament or biological substance: Secondary | ICD-10-CM | POA: Insufficient documentation

## 2022-02-23 DIAGNOSIS — Z87891 Personal history of nicotine dependence: Secondary | ICD-10-CM | POA: Diagnosis not present

## 2022-02-23 DIAGNOSIS — D61818 Other pancytopenia: Secondary | ICD-10-CM | POA: Diagnosis not present

## 2022-02-23 DIAGNOSIS — Z8249 Family history of ischemic heart disease and other diseases of the circulatory system: Secondary | ICD-10-CM | POA: Insufficient documentation

## 2022-02-23 DIAGNOSIS — E1022 Type 1 diabetes mellitus with diabetic chronic kidney disease: Secondary | ICD-10-CM | POA: Insufficient documentation

## 2022-02-23 DIAGNOSIS — I132 Hypertensive heart and chronic kidney disease with heart failure and with stage 5 chronic kidney disease, or end stage renal disease: Secondary | ICD-10-CM | POA: Insufficient documentation

## 2022-02-23 DIAGNOSIS — I340 Nonrheumatic mitral (valve) insufficiency: Secondary | ICD-10-CM | POA: Insufficient documentation

## 2022-02-23 DIAGNOSIS — K3184 Gastroparesis: Secondary | ICD-10-CM | POA: Insufficient documentation

## 2022-02-23 DIAGNOSIS — I3139 Other pericardial effusion (noninflammatory): Secondary | ICD-10-CM | POA: Insufficient documentation

## 2022-02-23 DIAGNOSIS — Z992 Dependence on renal dialysis: Secondary | ICD-10-CM | POA: Diagnosis not present

## 2022-02-23 DIAGNOSIS — R918 Other nonspecific abnormal finding of lung field: Secondary | ICD-10-CM | POA: Insufficient documentation

## 2022-02-23 DIAGNOSIS — T8612 Kidney transplant failure: Secondary | ICD-10-CM | POA: Diagnosis not present

## 2022-02-23 DIAGNOSIS — I161 Hypertensive emergency: Secondary | ICD-10-CM | POA: Diagnosis not present

## 2022-02-23 DIAGNOSIS — N2581 Secondary hyperparathyroidism of renal origin: Secondary | ICD-10-CM | POA: Insufficient documentation

## 2022-02-23 DIAGNOSIS — Z794 Long term (current) use of insulin: Secondary | ICD-10-CM | POA: Diagnosis not present

## 2022-02-23 DIAGNOSIS — D631 Anemia in chronic kidney disease: Secondary | ICD-10-CM | POA: Insufficient documentation

## 2022-02-23 DIAGNOSIS — Z7982 Long term (current) use of aspirin: Secondary | ICD-10-CM | POA: Diagnosis not present

## 2022-02-23 DIAGNOSIS — R0789 Other chest pain: Secondary | ICD-10-CM | POA: Diagnosis present

## 2022-02-23 DIAGNOSIS — Z9483 Pancreas transplant status: Secondary | ICD-10-CM | POA: Insufficient documentation

## 2022-02-23 DIAGNOSIS — R9431 Abnormal electrocardiogram [ECG] [EKG]: Secondary | ICD-10-CM | POA: Insufficient documentation

## 2022-02-23 DIAGNOSIS — J811 Chronic pulmonary edema: Secondary | ICD-10-CM | POA: Insufficient documentation

## 2022-02-23 DIAGNOSIS — Z79899 Other long term (current) drug therapy: Secondary | ICD-10-CM | POA: Diagnosis not present

## 2022-02-23 DIAGNOSIS — R079 Chest pain, unspecified: Secondary | ICD-10-CM | POA: Diagnosis present

## 2022-02-23 DIAGNOSIS — I509 Heart failure, unspecified: Secondary | ICD-10-CM | POA: Diagnosis not present

## 2022-02-23 DIAGNOSIS — I120 Hypertensive chronic kidney disease with stage 5 chronic kidney disease or end stage renal disease: Secondary | ICD-10-CM | POA: Insufficient documentation

## 2022-02-23 DIAGNOSIS — I160 Hypertensive urgency: Secondary | ICD-10-CM | POA: Diagnosis not present

## 2022-02-23 DIAGNOSIS — R000 Tachycardia, unspecified: Secondary | ICD-10-CM | POA: Diagnosis not present

## 2022-02-23 DIAGNOSIS — F1120 Opioid dependence, uncomplicated: Secondary | ICD-10-CM | POA: Diagnosis not present

## 2022-02-23 DIAGNOSIS — J9811 Atelectasis: Secondary | ICD-10-CM | POA: Insufficient documentation

## 2022-02-23 DIAGNOSIS — Z7952 Long term (current) use of systemic steroids: Secondary | ICD-10-CM | POA: Insufficient documentation

## 2022-02-23 DIAGNOSIS — E1043 Type 1 diabetes mellitus with diabetic autonomic (poly)neuropathy: Secondary | ICD-10-CM | POA: Insufficient documentation

## 2022-02-23 DIAGNOSIS — Z79621 Long term (current) use of calcineurin inhibitor: Secondary | ICD-10-CM | POA: Insufficient documentation

## 2022-02-23 DIAGNOSIS — Z91148 Patient's other noncompliance with medication regimen for other reason: Secondary | ICD-10-CM | POA: Insufficient documentation

## 2022-02-23 DIAGNOSIS — N186 End stage renal disease: Secondary | ICD-10-CM | POA: Insufficient documentation

## 2022-02-23 DIAGNOSIS — Z91158 Patient's noncompliance with renal dialysis for other reason: Secondary | ICD-10-CM | POA: Insufficient documentation

## 2022-02-23 DIAGNOSIS — R10819 Abdominal tenderness, unspecified site: Secondary | ICD-10-CM | POA: Insufficient documentation

## 2022-02-23 DIAGNOSIS — R0989 Other specified symptoms and signs involving the circulatory and respiratory systems: Secondary | ICD-10-CM | POA: Insufficient documentation

## 2022-02-23 DIAGNOSIS — E109 Type 1 diabetes mellitus without complications: Secondary | ICD-10-CM | POA: Diagnosis present

## 2022-02-23 DIAGNOSIS — F419 Anxiety disorder, unspecified: Secondary | ICD-10-CM | POA: Insufficient documentation

## 2022-02-23 DIAGNOSIS — Z7969 Long term (current) use of other immunomodulators and immunosuppressants: Secondary | ICD-10-CM | POA: Insufficient documentation

## 2022-02-23 LAB — BASIC METABOLIC PANEL
Anion gap: 15 (ref 5–15)
BUN: 37 mg/dL — ABNORMAL HIGH (ref 6–20)
CO2: 26 mmol/L (ref 22–32)
Calcium: 9.9 mg/dL (ref 8.9–10.3)
Chloride: 104 mmol/L (ref 98–111)
Creatinine, Ser: 6.97 mg/dL — ABNORMAL HIGH (ref 0.44–1.00)
GFR, Estimated: 7 mL/min — ABNORMAL LOW (ref >60)
Glucose, Bld: 96 mg/dL (ref 70–99)
Potassium: 5.1 mmol/L (ref 3.5–5.1)
Sodium: 145 mmol/L (ref 135–145)

## 2022-02-23 LAB — HEPATIC FUNCTION PANEL
ALT: 12 U/L (ref 0–44)
AST: 15 U/L (ref 15–41)
Albumin: 3.5 g/dL (ref 3.5–5.0)
Alkaline Phosphatase: 53 U/L (ref 38–126)
Bilirubin, Direct: 0.2 mg/dL (ref 0.0–0.2)
Indirect Bilirubin: 0.4 mg/dL (ref 0.3–0.9)
Total Bilirubin: 0.6 mg/dL (ref 0.3–1.2)
Total Protein: 6.9 g/dL (ref 6.5–8.1)

## 2022-02-23 LAB — CBC
HCT: 32.6 % — ABNORMAL LOW (ref 36.0–46.0)
Hemoglobin: 10.5 g/dL — ABNORMAL LOW (ref 12.0–15.0)
MCH: 30.3 pg (ref 26.0–34.0)
MCHC: 32.2 g/dL (ref 30.0–36.0)
MCV: 94.2 fL (ref 80.0–100.0)
Platelets: 102 10*3/uL — ABNORMAL LOW (ref 150–400)
RBC: 3.46 MIL/uL — ABNORMAL LOW (ref 3.87–5.11)
RDW: 12.9 % (ref 11.5–15.5)
WBC: 3 10*3/uL — ABNORMAL LOW (ref 4.0–10.5)
nRBC: 0 % (ref 0.0–0.2)

## 2022-02-23 LAB — LIPASE, BLOOD: Lipase: 21 U/L (ref 11–51)

## 2022-02-23 LAB — TROPONIN I (HIGH SENSITIVITY)
Troponin I (High Sensitivity): 16 ng/L (ref <18)
Troponin I (High Sensitivity): 17 ng/L (ref <18)

## 2022-02-23 LAB — PHOSPHORUS: Phosphorus: 3.2 mg/dL (ref 2.5–4.6)

## 2022-02-23 LAB — I-STAT BETA HCG BLOOD, ED (MC, WL, AP ONLY): I-stat hCG, quantitative: <5 m[IU]/mL (ref <5)

## 2022-02-23 LAB — BRAIN NATRIURETIC PEPTIDE: B Natriuretic Peptide: 892.5 pg/mL — ABNORMAL HIGH (ref 0.0–100.0)

## 2022-02-23 LAB — HEPATITIS B SURFACE ANTIGEN: Hepatitis B Surface Ag: NONREACTIVE

## 2022-02-23 IMAGING — CR DG CHEST 2V
2 series · 2 of 2 positions shown · non-contrast
Comparison: Two-view chest x-ray [DATE]

CLINICAL DATA: Chest pain

EXAM:
CHEST - 2 VIEW

[chest pa]
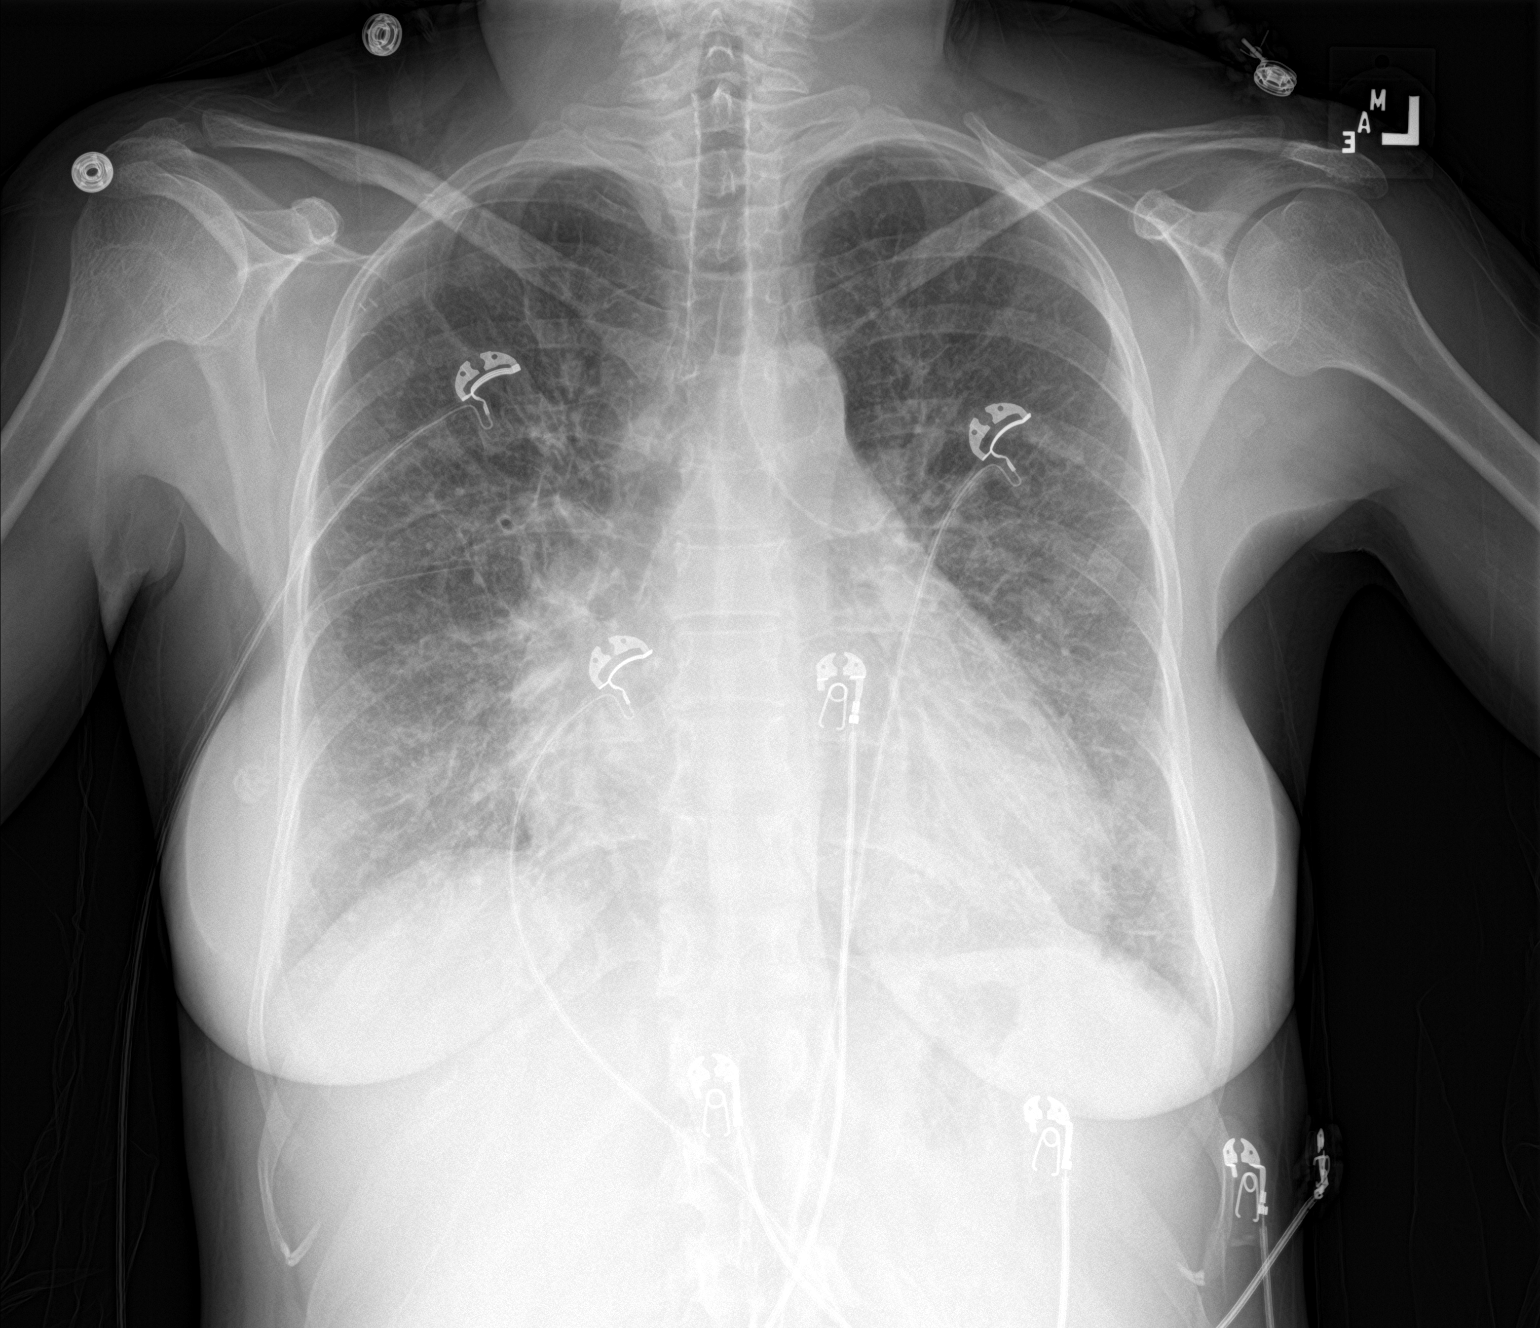

[chest lat]
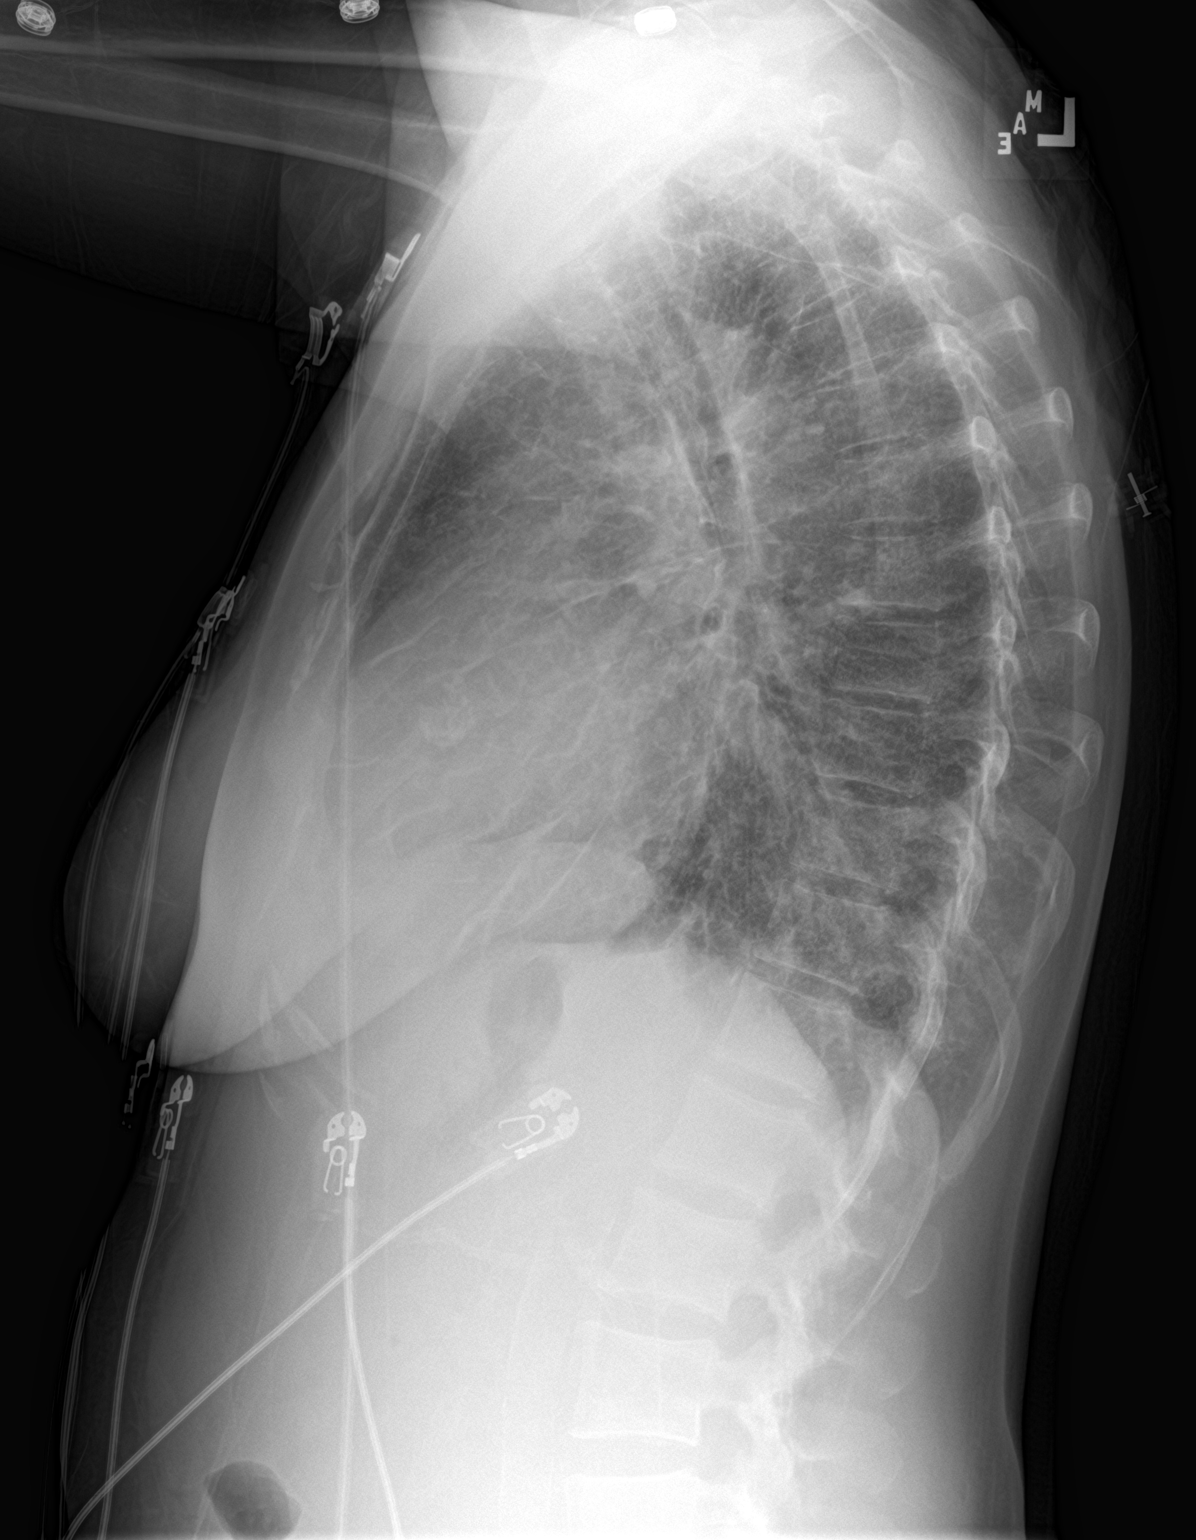

[2 of 2 positions shown; findings below may reference images not displayed]

FINDINGS: Heart is enlarged. Moderate interstitial edema is now present. No
significant effusions are present. Minimal atelectasis is present
both bases. No other significant airspace consolidation is present.
The visualized soft tissues and bony thorax are unremarkable.
IMPRESSION: 1. Cardiomegaly with moderate interstitial edema compatible with
congestive heart failure.
2. Minimal bibasilar atelectasis.

## 2022-02-23 MED ORDER — OXYCODONE HCL 5 MG PO TABS
10.0000 mg | ORAL_TABLET | Freq: Once | ORAL | Status: AC
  Administered 2022-02-23: 10 mg via ORAL
  Filled 2022-02-23: qty 2

## 2022-02-23 MED ORDER — CHLORHEXIDINE GLUCONATE CLOTH 2 % EX PADS: 6.0000 | MEDICATED_PAD | Freq: Every day | CUTANEOUS | Status: DC

## 2022-02-23 MED ORDER — FENTANYL CITRATE PF 50 MCG/ML IJ SOSY: 50.0000 ug | PREFILLED_SYRINGE | Freq: Once | INTRAMUSCULAR | Status: DC

## 2022-02-23 MED ORDER — DOXERCALCIFEROL 4 MCG/2ML IV SOLN
8.0000 ug | INTRAVENOUS | Status: DC
Start: 2022-02-24 — End: 2022-02-25
  Filled 2022-02-23: qty 4

## 2022-02-23 MED ORDER — LABETALOL HCL 5 MG/ML IV SOLN: 20.0000 mg | Freq: Once | INTRAVENOUS | Status: DC

## 2022-02-23 MED ORDER — HYDRALAZINE HCL 25 MG PO TABS
25.0000 mg | ORAL_TABLET | Freq: Once | ORAL | Status: AC
  Administered 2022-02-23: 25 mg via ORAL
  Filled 2022-02-23: qty 1

## 2022-02-23 MED ORDER — HALOPERIDOL LACTATE 5 MG/ML IJ SOLN
5.0000 mg | Freq: Once | INTRAMUSCULAR | Status: AC
  Administered 2022-02-23: 5 mg via INTRAMUSCULAR
  Filled 2022-02-23: qty 1

## 2022-02-23 MED ORDER — CINACALCET HCL 30 MG PO TABS
90.0000 mg | ORAL_TABLET | ORAL | Status: DC
Start: 2022-02-24 — End: 2022-02-25
  Filled 2022-02-23: qty 3

## 2022-02-23 MED ORDER — NITROGLYCERIN 0.4 MG SL SUBL: 0.4000 mg | SUBLINGUAL_TABLET | SUBLINGUAL | Status: DC | PRN

## 2022-02-23 MED ORDER — CARVEDILOL 12.5 MG PO TABS
12.5000 mg | ORAL_TABLET | Freq: Once | ORAL | Status: AC
Start: 2022-02-23 — End: 2022-02-23
  Administered 2022-02-23: 12.5 mg via ORAL
  Filled 2022-02-23: qty 1

## 2022-02-23 MED ORDER — HYDRALAZINE HCL 20 MG/ML IJ SOLN: 10.0000 mg | Freq: Once | INTRAMUSCULAR | Status: DC

## 2022-02-23 MED ORDER — LABETALOL HCL 5 MG/ML IV SOLN
20.0000 mg | Freq: Once | INTRAVENOUS | Status: AC
  Administered 2022-02-23: 20 mg via INTRAVENOUS
  Filled 2022-02-23: qty 4

## 2022-02-23 MED ORDER — LORAZEPAM 1 MG PO TABS
1.0000 mg | ORAL_TABLET | Freq: Once | ORAL | Status: AC
  Administered 2022-02-23: 1 mg via ORAL
  Filled 2022-02-23: qty 1

## 2022-02-23 MED ORDER — LORAZEPAM 2 MG/ML IJ SOLN: 1.0000 mg | Freq: Once | INTRAMUSCULAR | Status: DC

## 2022-02-23 NOTE — Progress Notes (Signed)
HD treatment terminated early as requested by patient. Explained the cons and still patient wanted off. 1 Hr left. Patient has no complaints of pain nor in any distress. All blood returned. No signs of complications. Bleeding stop for the access both at 71mns.  Report given to ED RN GShirlee Limerick Pt transported back to ED hallway 11 alert, oriented  and no acute distress.   Total UF Removed : 2L  Medication Given : Labetolol ( given by previous shift)   Post HD VS: BP 190/87, P-98bpm, RR 17Brpm, 99% on 1L O2 [place].   Post weight : 55kg

## 2022-02-23 NOTE — ED Provider Notes (Incomplete)
Care of the patient was assumed from [place] PA-C at 1500; see this physician's note for complete history of present illness, review of systems, and physical exam.    Briefly, the patient is a 41 y.o. 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 Last fully dialyzed yesterday. Previous admission for hypertensive emergency in May 2023 with negative CT dissection study at that time. Initially agitated, unable to obtaine IV access thus given IM haldol.  Plan at time of handoff:  -F/u lab work -Needs blood pressure management  MDM/ED Course: On my initial exam, the patient was ill appearing, complaining of severe chest pain, SOB, and inability to tolerate PO.  Her initial blood pressure on my evaluation was 235/118. She has not yet received anti-hypertensives.  Patient seen in conjunction with the attending physician, [name], MD, who participated in all aspects of the patient's care and was in agreement with the above plan.   Emergency Department Medication Summary: Medications  cinacalcet (SENSIPAR) tablet 90 mg (has no administration in time range)  doxercalciferol (HECTOROL) injection 8 mcg (has no administration in time range)  Chlorhexidine Gluconate Cloth 2 % PADS 6 each (has no administration in time range)  nitroGLYCERIN (NITROSTAT) SL tablet 0.4 mg (has no administration in time range)  labetalol (NORMODYNE) injection 20 mg (has no administration in time range)  haloperidol lactate (HALDOL) injection 5 mg (5 mg Intramuscular Given 02/23/22 1415)  labetalol (NORMODYNE) injection 20 mg (20 mg Intravenous Given 02/23/22 1612)  oxyCODONE (Oxy IR/ROXICODONE) immediate release tablet 10 mg (10 mg Oral Given 02/23/22 1611)  LORazepam (ATIVAN) tablet 1 mg (1 mg Oral Given 02/23/22 1611)   Clinical Impression: 1.  Hypertensive emergency

## 2022-02-23 NOTE — Consult Note (Signed)
ESRD Consult Note  Requesting provider: [name] Service requesting consult: ER Reason for consult: ESRD, provision of dialysis Indication for acute dialysis?: End Stage Renal Disease  Outpatient dialysis unit: [facility] Outpatient dialysis prescription: MWF, F1 80, BFR 400, DFR 500, 2K, 2 Ca, EDW 54.5, duration 3.75 hours, right upper extremity AVF, no current ESA, Hectorol 8 mcg with each treatment, Sensipar 90 mg with each treatment  Assessment/Recommendations: [name] [name] is a/an 41 y.o. female with a past medical history notable for ESRD on HD admitted with hypertension and volume overload.   # ESRD: Will order dialysis now and adjust if needed based on lab results.  Will need volume removal today because of hypertension and volume overload.  Possibly dialysis again tomorrow based on her progress and to keep her on schedule.  # Volume/ hypertension: EDW 54.5 kg.  Not achieving dry weight outpatient with large idwg. HD today and likely tomorrow. Continue home BP meds  # Anemia of Chronic Kidney Disease: Hemoglobin 10.5. No esa.   # Secondary Hyperparathyroidism/Hyperphosphatemia: Continue home hectorol and sensipar with dialysis. Continue home binder. Add on phos  # Vascular access: RUE w/ no issues  # Chest pain: w/u per primary. Likely related to HTN and volume overload  # Additional recommendations: - Dose all meds for creatinine clearance < 10 ml/min  - Unless absolutely necessary, no MRIs with gadolinium.  - Implement save arm precautions.  Prefer needle sticks in the dorsum of the hands or wrists.  No blood pressure measurements in arm. - If blood transfusion is requested during hemodialysis sessions, please alert [place] prior to the session.  - Use synthetic opioids (Fentanyl/Dilaudid) if needed  Recommendations were discussed with the primary team.   History of Present Illness: [name] [name] is a/an 41 y.o. female with a past medical history of ESRD who  presents with chest pain.  Patient states that she has been having some centralized chest pain that radiates to the back since yesterday.  She has intermittently had this pain on dialysis as well.  Typically she experiences the pain about halfway through the treatment.  She did have a short treatment yesterday likely because of the pain.  She typically has large cans of fluid and has not been achieving her dry weight.  Blood pressure is typically very elevated before dialysis and comes down to around 170 after volume removal.  Is unclear if the patient has been completely compliant with her blood pressure medications.  She states that she is having nausea and some vomiting which is limiting her ability to take medications.  She has a hard time sleeping because of the pain.  She also feels like she is aching all over and is asking for Ativan and pain medications.  In the emergency department she was noted to be markedly hypertensive.  Chest x-ray demonstrated interstitial edema and cardiomegaly.  Hemoglobin 10.5 on CBC.  Other labs pending at this time.  She does have some mild thrombocytopenia down to 102.  This is lower than she typically runs. Other labs pending.   Medications:  No current facility-administered medications for this encounter.   Current Outpatient Medications  Medication Sig Dispense Refill   amLODipine (NORVASC) 10 MG tablet Take 1 tablet (10 mg total) by mouth daily. 30 tablet 2   aspirin 81 MG chewable tablet Chew 81 mg by mouth daily.     AURYXIA 1 GM 210 MG(Fe) tablet Take 630 mg by mouth 3 (three) times daily.  calcium acetate (PHOSLO) 667 MG capsule Take 4 capsules (2,668 mg total) by mouth 3 (three) times daily with meals. 360 capsule 2   Calcium Carbonate Antacid (CALCIUM CARBONATE, DOSED IN MG ELEMENTAL CALCIUM,) 1250 MG/5ML SUSP Take 5 mLs (500 mg of elemental calcium total) by mouth every 6 (six) hours as needed for indigestion. 450 mL 0   cloNIDine (CATAPRES - DOSED  IN MG/24 HR) 0.2 mg/24hr patch Place 1 patch (0.2 mg total) onto the skin once a week. 4 patch 2   cycloSPORINE modified (NEORAL) 100 MG capsule Take 2 capsules (200 mg total) by mouth 2 (two) times daily.     cycloSPORINE modified (NEORAL) 25 MG capsule Take 25 mg by mouth daily.     DULoxetine (CYMBALTA) 20 MG capsule Take 1 capsule (20 mg total) by mouth daily. 30 capsule 2   hydrALAZINE (APRESOLINE) 25 MG tablet Take 3 tablets (75 mg total) by mouth every 8 (eight) hours. 270 tablet 2   lidocaine-prilocaine (EMLA) cream Apply 1 application topically daily as needed (port access).     LORazepam (ATIVAN) 0.5 MG tablet Take 0.5 mg by mouth every 6 (six) hours.     losartan (COZAAR) 100 MG tablet Take 1 tablet (100 mg total) by mouth daily. 30 tablet 2   metoCLOPramide (REGLAN) 5 MG tablet Take 5 mg by mouth every 6 (six) hours as needed for nausea or vomiting.     mycophenolate (MYFORTIC) 180 MG EC tablet Take 540 mg by mouth 2 (two) times daily.     ondansetron (ZOFRAN) 4 MG tablet Take 4 mg by mouth every 8 (eight) hours as needed for nausea or vomiting.     oxyCODONE-acetaminophen (PERCOCET/ROXICET) 5-325 MG tablet Take 1 tablet by mouth every 6 (six) hours as needed for severe pain or moderate pain.     pantoprazole (PROTONIX) 40 MG tablet Take 1 tablet (40 mg total) by mouth daily. 30 tablet 2   predniSONE (DELTASONE) 5 MG tablet Take 15 mg by mouth daily.  5   sodium zirconium cyclosilicate (LOKELMA) 10 g PACK packet Take 10 g by mouth as directed. Take on Non-Dialysis (Sun,Tues, Thurs, Sat)     zolpidem (AMBIEN) 5 MG tablet Take 5 mg by mouth at bedtime.     carvedilol (COREG) 12.5 MG tablet Take 1 tablet (12.5 mg total) by mouth 2 (two) times daily with a meal. (Patient not taking: Reported on 02/23/2022) 60 tablet 2   Darbepoetin Alfa (ARANESP) 60 MCG/0.3ML SOSY injection Inject 0.3 mLs (60 mcg total) into the vein every Friday with hemodialysis. 4.2 mL    doxercalciferol (HECTOROL) 4  MCG/2ML injection Inject 3 mLs (6 mcg total) into the vein every Monday, Wednesday, and Friday with hemodialysis. 1 mL      ALLERGIES Patient has no known allergies.  MEDICAL HISTORY Past Medical History:  Diagnosis Date   Anemia    Anxiety    ESRD (end stage renal disease) ([place])    Henmodialysis   Gastroparesis    Hypertension    Narcotic abuse ([place])    Non compliance with medical treatment    Renal disorder 2015   transplant   Vision loss      SOCIAL HISTORY Social History   Socioeconomic History   Marital status: Married    Spouse name: Not on file   Number of children: Not on file   Years of education: Not on file   Highest education level: Not on file  Occupational History   Not  on file  Tobacco Use   Smoking status: Former    Types: Pipe   Smokeless tobacco: Never   Tobacco comments:    Hooka  Vaping Use   Vaping Use: Never used  Substance and Sexual Activity   Alcohol use: No   Drug use: No   Sexual activity: Not Currently  Other Topics Concern   Not on file  Social History Narrative   Worked for [employer]--as did husband. Had to leave [place] for safety reasons. Has been in [place] since 9/09.   Social Determinants of Health   Financial Resource Strain: Not on file  Food Insecurity: Not on file  Transportation Needs: Not on file  Physical Activity: Not on file  Stress: Not on file  Social Connections: Not on file  Intimate Partner Violence: Not on file     FAMILY HISTORY Family History  Problem Relation Age of Onset   Hypertension Father      Review of Systems: 12 systems were reviewed and negative except per HPI  Physical Exam: Vitals:   02/23/22 1315 02/23/22 1430  BP: (!) 224/94 (!) 222/112  Pulse: 100 (!) 108  Resp: (!) 26 13  Temp:    SpO2: 94% 99%   No intake/output data recorded. No intake or output data in the 24 hours ending 02/23/22 1546 General: Chronically ill-appearing, tearful, mild intermittent distress HEENT: anicteric  sclera, MMM CV: Tachycardia, no audible murmur, no lower extremity edema Lungs: bilateral chest rise, increased work of breathing, crackles auscultated Abd: soft, non-tender, non-distended Skin: Scar present over right upper extremity AVF, no visible lesions or rashes Psych: alert, engaged, affect appropriate, mood tearful and depressed Neuro: normal speech, no gross focal deficits   Test Results Reviewed Lab Results  Component Value Date   NA 135 02/05/2022   K 5.6 (H) 02/05/2022   CL 93 (L) 02/05/2022   CO2 27 02/05/2022   BUN 43 (H) 02/05/2022   CREATININE 5.77 (H) 02/05/2022   CALCIUM 10.1 02/05/2022   ALBUMIN 3.4 (L) 01/30/2022   PHOS 7.4 (H) 01/31/2022    I have reviewed relevant outside healthcare records

## 2022-02-23 NOTE — ED Notes (Signed)
Signed informed consent at bedside.

## 2022-02-23 NOTE — ED Provider Notes (Signed)
[facility] EMERGENCY DEPARTMENT Provider Note   CSN: [id number] Arrival date & time: 02/23/22  1247     History  Chief Complaint  Patient presents with   Chest Pain    [name] [name] is a 41 y.o. female.  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 States that she completed hemodialysis yesterday and had pain that started last night and became more severe today. She states that the pain is severe in nature and radiates to her back. Denies missing her home BP medications. Of note, she was just admitted with similar symptoms for hypertensive urgency. She had a dissection study at that time that was negative. Denies n/v/d.   The history is provided by the patient. No language interpreter was used.  Chest Pain Associated symptoms: abdominal pain        Home Medications Prior to Admission medications   Medication Sig Start Date End Date Taking? Authorizing Provider  amLODipine (NORVASC) 10 MG tablet Take 1 tablet (10 mg total) by mouth daily. 02/05/22 05/06/22 Yes Dahal, Marlowe Aschoff, MD  aspirin 81 MG chewable tablet Chew 81 mg by mouth daily.   Yes [provider]  AURYXIA 1 GM 210 MG(Fe) tablet Take 630 mg by mouth 3 (three) times daily. 02/18/22  Yes [provider]  calcium acetate (PHOSLO) 667 MG capsule Take 4 capsules (2,668 mg total) by mouth 3 (three) times daily with meals. 02/05/22 05/06/22 Yes Dahal, Marlowe Aschoff, MD  Calcium Carbonate Antacid (CALCIUM CARBONATE, DOSED IN MG ELEMENTAL CALCIUM,) 1250 MG/5ML SUSP Take 5 mLs (500 mg of elemental calcium total) by mouth every 6 (six) hours as needed for indigestion. 02/05/22 05/06/22 Yes Dahal, Marlowe Aschoff, MD  cloNIDine (CATAPRES - DOSED IN MG/24 HR) 0.2 mg/24hr patch Place 1 patch (0.2 mg  total) onto the skin once a week. 02/08/22 05/09/22 Yes Dahal, Marlowe Aschoff, MD  cycloSPORINE modified (NEORAL) 100 MG capsule Take 2 capsules (200 mg total) by mouth 2 (two) times daily. 12/25/21  Yes Pokhrel, Laxman, MD  cycloSPORINE modified (NEORAL) 25 MG capsule Take 25 mg by mouth daily.   Yes [provider]  DULoxetine (CYMBALTA) 20 MG capsule Take 1 capsule (20 mg total) by mouth daily. 02/06/22 05/07/22 Yes Dahal, Marlowe Aschoff, MD  hydrALAZINE (APRESOLINE) 25 MG tablet Take 3 tablets (75 mg total) by mouth every 8 (eight) hours. 02/05/22 05/06/22 Yes Dahal, Marlowe Aschoff, MD  lidocaine-prilocaine (EMLA) cream Apply 1 application topically daily as needed (port access). 07/09/20  Yes [provider]  LORazepam (ATIVAN) 0.5 MG tablet Take 0.5 mg by mouth every 6 (six) hours.   Yes [provider]  losartan (COZAAR) 100 MG tablet Take 1 tablet (100 mg total) by mouth daily. 12/04/21 03/04/22 Yes [place] Eric J, DO  metoCLOPramide (REGLAN) 5 MG tablet Take 5 mg by mouth every 6 (six) hours as needed for nausea or vomiting.   Yes [provider]  mycophenolate (MYFORTIC) 180 MG EC tablet Take 540 mg by mouth 2 (two) times daily.   Yes [provider]  ondansetron (ZOFRAN) 4 MG tablet Take 4 mg by mouth every 8 (eight) hours as needed for nausea or vomiting.   Yes [provider]  oxyCODONE-acetaminophen (PERCOCET/ROXICET) 5-325 MG tablet Take 1 tablet by mouth every 6 (six) hours as needed for severe pain or moderate pain.  Yes [provider]  pantoprazole (PROTONIX) 40 MG tablet Take 1 tablet (40 mg total) by mouth daily. 02/06/22 05/07/22 Yes Dahal, Marlowe Aschoff, MD  predniSONE (DELTASONE) 5 MG tablet Take 15 mg by mouth daily. 06/24/15  Yes [provider]  sodium zirconium cyclosilicate (LOKELMA) 10 g PACK packet Take 10 g by mouth as directed. Take on Non-Dialysis (Sun,Tues, Thurs, Sat)   Yes [provider]  zolpidem (AMBIEN) 5 MG tablet Take 5 mg by  mouth at bedtime. 12/07/21  Yes [provider]  carvedilol (COREG) 12.5 MG tablet Take 1 tablet (12.5 mg total) by mouth 2 (two) times daily with a meal. Patient not taking: Reported on 02/23/2022 02/05/22 05/06/22  Terrilee Croak, MD  Darbepoetin Alfa (ARANESP) 60 MCG/0.3ML SOSY injection Inject 0.3 mLs (60 mcg total) into the vein every Friday with hemodialysis. 11/18/21   Cherene Altes, MD  doxercalciferol (HECTOROL) 4 MCG/2ML injection Inject 3 mLs (6 mcg total) into the vein every Monday, Wednesday, and Friday with hemodialysis. 11/17/21   Cherene Altes, MD      Allergies    Patient has no known allergies.    Review of Systems   Review of Systems  Cardiovascular:  Positive for chest pain.  Gastrointestinal:  Positive for abdominal pain.  All other systems reviewed and are negative.   Physical Exam Updated Vital Signs BP (!) 222/112   Pulse (!) 108   Temp 98.1 F (36.7 C) (Oral)   Resp 13   Ht '5\' 2"'$  (1.575 m)   SpO2 99%   BMI 22.46 kg/m  Physical Exam Vitals and nursing note reviewed.  Constitutional:      General: She is not in acute distress.    Appearance: Normal appearance. She is normal weight. She is not ill-appearing, toxic-appearing or diaphoretic.     Comments: Chronically ill appearing in obvious discomfort but no acute distress  HENT:     Head: Normocephalic and atraumatic.  Cardiovascular:     Rate and Rhythm: Tachycardia present.  Pulmonary:     Effort: Pulmonary effort is normal. No respiratory distress.  Abdominal:     Tenderness: There is abdominal tenderness.  Musculoskeletal:        General: Normal range of motion.     Cervical back: Normal range of motion.  Skin:    General: Skin is warm and dry.  Neurological:     General: No focal deficit present.     Mental Status: She is alert.  Psychiatric:        Mood and Affect: Mood normal.        Behavior: Behavior normal.     ED Results / Procedures / Treatments   Labs (all labs  ordered are listed, but only abnormal results are displayed) Labs Reviewed  CBC - Abnormal; Notable for the following components:      Result Value   WBC 3.0 (*)    RBC 3.46 (*)    Hemoglobin 10.5 (*)    HCT 32.6 (*)    All other components within normal limits  BASIC METABOLIC PANEL  HEPATIC FUNCTION PANEL  BRAIN NATRIURETIC PEPTIDE  I-STAT BETA HCG BLOOD, ED ([facility], [facility], [facility] ONLY)  TROPONIN I (HIGH SENSITIVITY)  TROPONIN I (HIGH SENSITIVITY)    EKG EKG Interpretation  Date/Time:  Thursday February 23 2022 13:43:14 EDT Ventricular Rate:  94 PR Interval:  143 QRS Duration: 76 QT Interval:  367 QTC Calculation: 459 R Axis:   123 Text Interpretation: Right and left  arm electrode reversal, interpretation assumes no reversal Sinus or ectopic atrial rhythm Probable left atrial enlargement Right axis deviation Low voltage, extremity leads Consider left ventricular hypertrophy TWI in v6 is new Confirmed by Varney Biles [phone]) on 02/23/2022 1:58:45 PM  Radiology DG Chest 2 View  Result Date: 02/23/2022 CLINICAL DATA:  Chest pain EXAM: CHEST - 2 VIEW COMPARISON:  Two-view chest x-ray 02/02/2022 FINDINGS: Heart is enlarged. Moderate interstitial edema is now present. No significant effusions are present. Minimal atelectasis is present both bases. No other significant airspace consolidation is present. The visualized soft tissues and bony thorax are unremarkable. IMPRESSION: 1. Cardiomegaly with moderate interstitial edema compatible with congestive heart failure. 2. Minimal bibasilar atelectasis. Electronically Signed   By: San Morelle M.D.   On: 02/23/2022 13:32    Procedures Procedures    Medications Ordered in ED Medications  haloperidol lactate (HALDOL) injection 5 mg (5 mg Intramuscular Given 02/23/22 1415)    ED Course/ Medical Decision Making/ A&P                           Medical Decision Making Amount and/or Complexity of Data Reviewed Labs: ordered. Radiology:  ordered.  Risk Prescription drug management. Decision regarding hospitalization.   Patient presents today with severe chest and abdominal pain. Recently presented for same and was admitted for hypertensive crisis. BP currently 217/91. Initially some concern for dissection, however she had a dissection study 1 month ago that was normal. Labs pending at shift change. Some issue with IV placement given patient is a hard stick, she will need anti-hypertensive medication when this is placed. Given haldol for patients pain with possible concern that this may be related to her gastroparesis with some improvement.   DG chest obtained which shows CHF, I have reviewed these findings and agree with radiology interpretation.  EKG shows no STEMI.   Patient will need meds and reassessment following labs and IV placement. Will likely need readmission for hypertension.  Care handoff to Martinique Causey, MD at shift change. Please see their note for further evaluation and dispo.   This is a shared visit with supervising physician Dr. Tomi Bamberger who has independently evaluated patient & provided guidance in evaluation/management/disposition, in agreement with care    Final Clinical Impression(s) / ED Diagnoses Final diagnoses:  None    Rx / DC Orders ED Discharge Orders     None         Nestor Lewandowsky 02/27/22 1919    Varney Biles, MD 03/04/22 1027

## 2022-02-23 NOTE — ED Triage Notes (Signed)
Pt BIB [facility] from home c/o centralized CP that radiates to the back. Pt is a dialysis pt and had a full treatment yesterday. Right arm is restricted.

## 2022-02-23 NOTE — ED Notes (Signed)
Lab called to add on BNP and Hepatic function

## 2022-02-24 ENCOUNTER — Observation Stay (HOSPITAL_BASED_OUTPATIENT_CLINIC_OR_DEPARTMENT_OTHER): Payer: Medicare Other

## 2022-02-24 ENCOUNTER — Observation Stay (HOSPITAL_COMMUNITY): Payer: Medicare Other

## 2022-02-24 DIAGNOSIS — F1120 Opioid dependence, uncomplicated: Secondary | ICD-10-CM | POA: Diagnosis not present

## 2022-02-24 DIAGNOSIS — Z9483 Pancreas transplant status: Secondary | ICD-10-CM

## 2022-02-24 DIAGNOSIS — I3139 Other pericardial effusion (noninflammatory): Secondary | ICD-10-CM

## 2022-02-24 DIAGNOSIS — I161 Hypertensive emergency: Secondary | ICD-10-CM

## 2022-02-24 DIAGNOSIS — N186 End stage renal disease: Secondary | ICD-10-CM

## 2022-02-24 DIAGNOSIS — Z7982 Long term (current) use of aspirin: Secondary | ICD-10-CM | POA: Diagnosis not present

## 2022-02-24 DIAGNOSIS — I160 Hypertensive urgency: Secondary | ICD-10-CM | POA: Diagnosis not present

## 2022-02-24 DIAGNOSIS — Z79899 Other long term (current) drug therapy: Secondary | ICD-10-CM | POA: Diagnosis not present

## 2022-02-24 DIAGNOSIS — Z992 Dependence on renal dialysis: Secondary | ICD-10-CM

## 2022-02-24 DIAGNOSIS — E1022 Type 1 diabetes mellitus with diabetic chronic kidney disease: Secondary | ICD-10-CM

## 2022-02-24 LAB — BASIC METABOLIC PANEL
Anion gap: 10 (ref 5–15)
BUN: 21 mg/dL — ABNORMAL HIGH (ref 6–20)
CO2: 28 mmol/L (ref 22–32)
Calcium: 9.5 mg/dL (ref 8.9–10.3)
Chloride: 99 mmol/L (ref 98–111)
Creatinine, Ser: 4.83 mg/dL — ABNORMAL HIGH (ref 0.44–1.00)
GFR, Estimated: 11 mL/min — ABNORMAL LOW (ref >60)
Glucose, Bld: 105 mg/dL — ABNORMAL HIGH (ref 70–99)
Potassium: 4.9 mmol/L (ref 3.5–5.1)
Sodium: 137 mmol/L (ref 135–145)

## 2022-02-24 LAB — HEPATITIS B SURFACE ANTIBODY,QUALITATIVE: Hep B S Ab: REACTIVE — AB

## 2022-02-24 LAB — CBC
HCT: 32.8 % — ABNORMAL LOW (ref 36.0–46.0)
Hemoglobin: 10.7 g/dL — ABNORMAL LOW (ref 12.0–15.0)
MCH: 31 pg (ref 26.0–34.0)
MCHC: 32.6 g/dL (ref 30.0–36.0)
MCV: 95.1 fL (ref 80.0–100.0)
Platelets: 121 10*3/uL — ABNORMAL LOW (ref 150–400)
RBC: 3.45 MIL/uL — ABNORMAL LOW (ref 3.87–5.11)
RDW: 13 % (ref 11.5–15.5)
WBC: 3.9 10*3/uL — ABNORMAL LOW (ref 4.0–10.5)
nRBC: 0 % (ref 0.0–0.2)

## 2022-02-24 LAB — HEPATITIS B CORE ANTIBODY, TOTAL: Hep B Core Total Ab: NONREACTIVE

## 2022-02-24 LAB — ECHOCARDIOGRAM LIMITED
Height: 62 in
Weight: 1943.58 oz

## 2022-02-24 LAB — HEPATITIS B SURFACE ANTIGEN: Hepatitis B Surface Ag: NONREACTIVE

## 2022-02-24 LAB — HEPATITIS C ANTIBODY: HCV Ab: NONREACTIVE

## 2022-02-24 IMAGING — DX DG ABD PORTABLE 1V
1 series · 1 of 1 positions shown · non-contrast
Comparison: [DATE]

CLINICAL DATA: Abdomen pain

EXAM:
PORTABLE ABDOMEN - 1 VIEW

[abdomen]
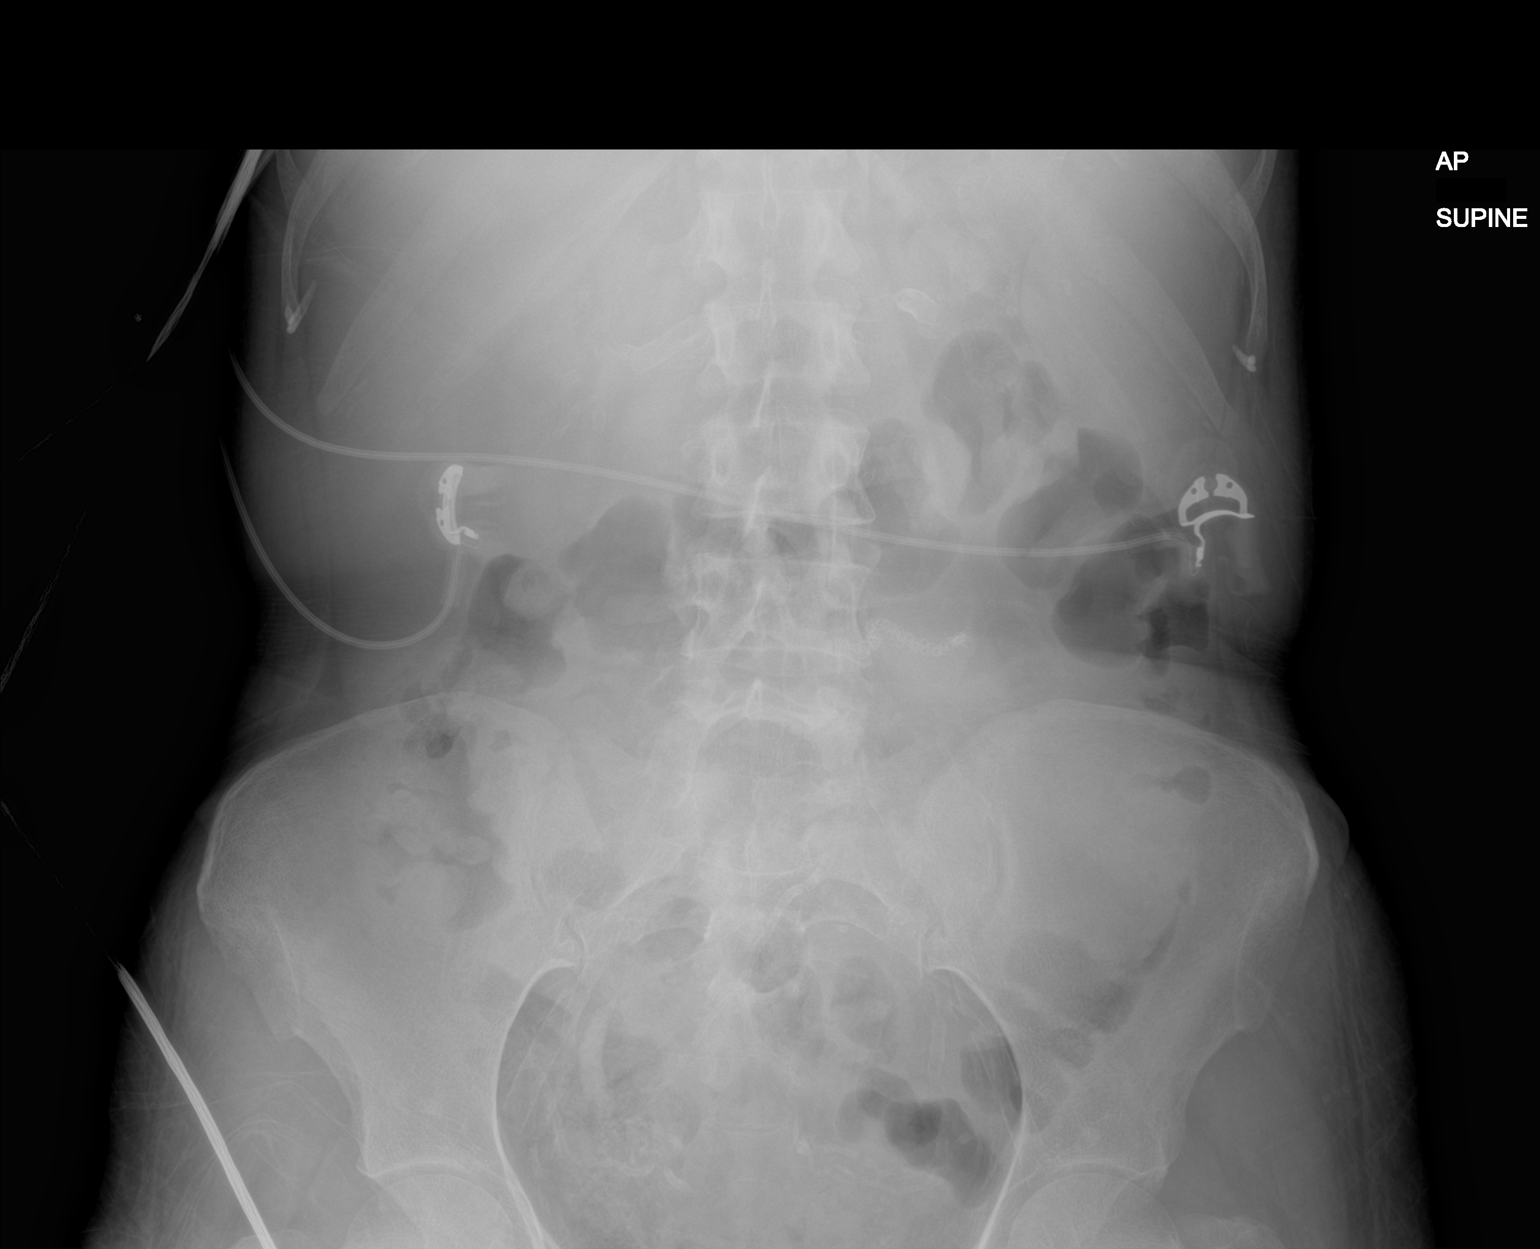

[1 of 1 positions shown; findings below may reference images not displayed]

FINDINGS: Normal bowel gas pattern. No obstruction or ileus. Surgical bowel
clips in the left mid abdomen. Diffuse vascular calcification
including the renal arteries bilaterally. No acute skeletal
abnormality.
IMPRESSION: Normal bowel gas pattern.  No acute abnormality.

## 2022-02-24 MED ORDER — ONDANSETRON HCL 4 MG PO TABS: 4.0000 mg | ORAL_TABLET | Freq: Four times a day (QID) | ORAL | Status: DC | PRN

## 2022-02-24 MED ORDER — PANTOPRAZOLE SODIUM 40 MG PO TBEC
40.0000 mg | DELAYED_RELEASE_TABLET | Freq: Every day | ORAL | Status: DC
  Administered 2022-02-24: 40 mg via ORAL
  Filled 2022-02-24: qty 1

## 2022-02-24 MED ORDER — CALCIUM ACETATE (PHOS BINDER) 667 MG PO CAPS
2668.0000 mg | ORAL_CAPSULE | Freq: Three times a day (TID) | ORAL | Status: DC
  Filled 2022-02-24: qty 4

## 2022-02-24 MED ORDER — PREDNISONE 5 MG PO TABS
15.0000 mg | ORAL_TABLET | Freq: Every day | ORAL | Status: DC
  Administered 2022-02-24: 15 mg via ORAL
  Filled 2022-02-24: qty 1

## 2022-02-24 MED ORDER — FERRIC CITRATE 1 GM 210 MG(FE) PO TABS
630.0000 mg | ORAL_TABLET | Freq: Three times a day (TID) | ORAL | Status: DC
  Administered 2022-02-24: 630 mg via ORAL
  Filled 2022-02-24: qty 3

## 2022-02-24 MED ORDER — ASPIRIN 81 MG PO CHEW
81.0000 mg | CHEWABLE_TABLET | Freq: Every day | ORAL | Status: DC
  Administered 2022-02-24: 81 mg via ORAL
  Filled 2022-02-24: qty 1

## 2022-02-24 MED ORDER — ONDANSETRON HCL 4 MG/2ML IJ SOLN: 4.0000 mg | Freq: Four times a day (QID) | INTRAMUSCULAR | Status: DC | PRN

## 2022-02-24 MED ORDER — METOCLOPRAMIDE HCL 5 MG PO TABS: 5.0000 mg | ORAL_TABLET | Freq: Four times a day (QID) | ORAL | Status: DC | PRN

## 2022-02-24 MED ORDER — OXYCODONE-ACETAMINOPHEN 5-325 MG PO TABS
1.0000 | ORAL_TABLET | Freq: Three times a day (TID) | ORAL | Status: DC | PRN
  Administered 2022-02-24 (×2): 1 via ORAL
  Filled 2022-02-24 (×2): qty 1

## 2022-02-24 MED ORDER — CYCLOSPORINE MODIFIED (NEORAL) 25 MG PO CAPS
25.0000 mg | ORAL_CAPSULE | Freq: Every day | ORAL | Status: DC
  Filled 2022-02-24: qty 1

## 2022-02-24 MED ORDER — DULOXETINE HCL 20 MG PO CPEP
20.0000 mg | ORAL_CAPSULE | Freq: Every day | ORAL | Status: DC
  Administered 2022-02-24: 20 mg via ORAL
  Filled 2022-02-24: qty 1

## 2022-02-24 MED ORDER — CLONIDINE HCL 0.2 MG/24HR TD PTWK
0.2000 mg | MEDICATED_PATCH | TRANSDERMAL | Status: DC
Start: 2022-03-01 — End: 2022-02-25

## 2022-02-24 MED ORDER — LABETALOL HCL 5 MG/ML IV SOLN
20.0000 mg | Freq: Once | INTRAVENOUS | Status: AC
  Administered 2022-02-24: 20 mg via INTRAVENOUS
  Filled 2022-02-24: qty 4

## 2022-02-24 MED ORDER — OXYCODONE HCL 5 MG PO TABS
10.0000 mg | ORAL_TABLET | Freq: Once | ORAL | Status: AC
  Administered 2022-02-24: 10 mg via ORAL
  Filled 2022-02-24: qty 2

## 2022-02-24 MED ORDER — CHLORHEXIDINE GLUCONATE CLOTH 2 % EX PADS: 6.0000 | MEDICATED_PAD | Freq: Every day | CUTANEOUS | Status: DC

## 2022-02-24 MED ORDER — HEPARIN SODIUM (PORCINE) 5000 UNIT/ML IJ SOLN
5000.0000 [IU] | Freq: Three times a day (TID) | INTRAMUSCULAR | Status: DC
  Administered 2022-02-24: 5000 [IU] via SUBCUTANEOUS

## 2022-02-24 MED ORDER — LOSARTAN POTASSIUM 50 MG PO TABS
100.0000 mg | ORAL_TABLET | Freq: Every day | ORAL | Status: DC
  Administered 2022-02-24: 100 mg via ORAL
  Filled 2022-02-24: qty 2

## 2022-02-24 MED ORDER — AMLODIPINE BESYLATE 10 MG PO TABS
10.0000 mg | ORAL_TABLET | Freq: Every day | ORAL | Status: DC
  Administered 2022-02-24: 10 mg via ORAL
  Filled 2022-02-24: qty 1

## 2022-02-24 MED ORDER — MYCOPHENOLATE SODIUM 180 MG PO TBEC
540.0000 mg | DELAYED_RELEASE_TABLET | Freq: Two times a day (BID) | ORAL | Status: DC
  Administered 2022-02-24: 540 mg via ORAL
  Filled 2022-02-24 (×3): qty 3

## 2022-02-24 MED ORDER — HYDRALAZINE HCL 50 MG PO TABS
75.0000 mg | ORAL_TABLET | Freq: Three times a day (TID) | ORAL | Status: DC
  Administered 2022-02-24 (×3): 75 mg via ORAL
  Filled 2022-02-24 (×3): qty 1

## 2022-02-24 MED ORDER — ACETAMINOPHEN 650 MG RE SUPP: 650.0000 mg | Freq: Four times a day (QID) | RECTAL | Status: DC | PRN

## 2022-02-24 MED ORDER — ACETAMINOPHEN 325 MG PO TABS: 650.0000 mg | ORAL_TABLET | Freq: Four times a day (QID) | ORAL | Status: DC | PRN

## 2022-02-24 MED ORDER — CYCLOSPORINE MODIFIED (NEORAL) 100 MG PO CAPS
200.0000 mg | ORAL_CAPSULE | Freq: Two times a day (BID) | ORAL | Status: DC
  Administered 2022-02-24: 200 mg via ORAL
  Filled 2022-02-24 (×3): qty 2

## 2022-02-24 NOTE — Assessment & Plan Note (Signed)
Not requiring any medical therapy at this time secondary to pancreas transplant.

## 2022-02-24 NOTE — Assessment & Plan Note (Addendum)
Patient underwent truncated hemodialysis session while inpatient and requested to have repeat hemodialysis as an outpatient. Next hemodialysis set up for 6/24.

## 2022-02-24 NOTE — H&P (Signed)
History and Physical    Patient: [name] [name] ZOX:[id number] DOB: 1981-01-26 DOA: 02/23/2022 DOS: the patient was seen and examined on 02/24/2022 PCP: [facility]  Patient coming from: Home  Chief Complaint:  Chief Complaint  Patient presents with   Chest Pain   HPI: [name] [name] is a 41 y.o. female with medical history significant of ESRD on MWF HD.  Occurs in setting of DM1.  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  Also has HTN.  Pt presents to ED with c/o CP and HTN.  H/o admit last month for same.  Given IV labetalol and started on dialysis from ED.  Pt unfortunately signed herself off an hour early  Her BP continued to be high in ED and she continued to complain of CP so medical admission for HTN urgency and further dialysis tomorrow requested.    Review of Systems: As mentioned in the history of present illness. All other systems reviewed and are negative. Past Medical History:  Diagnosis Date   Anemia    Anxiety    ESRD (end stage renal disease) (HCC)    Henmodialysis   Gastroparesis    Hypertension    Narcotic abuse (HCC)    Non compliance with medical treatment    Renal disorder 2015   transplant   Vision loss    Past Surgical History:  Procedure Laterality Date   A/V FISTULAGRAM Right 02/17/2021   Procedure: A/V FISTULAGRAM;  Surgeon: Cephus Shelling, MD;  [facility] IN[facility];  Service: Cardiovascular;  Laterality: Right;   AV FISTULA PLACEMENT  11/17/2011   Procedure: ARTERIOVENOUS (AV) FISTULA CREATION;  Surgeon: Larina Earthly, MD;  Location: [facility] OR;  Service: Vascular;  Laterality: Right;   BIOPSY  12/03/2021   Procedure: BIOPSY;  Surgeon: Sherrilyn Rist, MD;  Location: [facility] ENDOSCOPY;  Service: Gastroenterology;;   ESOPHAGOGASTRODUODENOSCOPY (EGD) WITH PROPOFOL N/A 12/03/2021   Procedure: ESOPHAGOGASTRODUODENOSCOPY (EGD) WITH PROPOFOL;   Surgeon: Sherrilyn Rist, MD;  Location: [facility] ENDOSCOPY;  Service: Gastroenterology;  Laterality: N/A;   EYE SURGERY     HEMATOMA EVACUATION Right 02/25/2021   Procedure: EVACUATION HEMATOMA RIGHT CHEST;  Surgeon: Maeola Harman, MD;  Location: [facility] OR;  Service: Vascular;  Laterality: Right;   INSERTION OF DIALYSIS CATHETER Right 12/08/2020   Procedure: INSERTION OF TUNNELED DIALYSIS CATHETER;  Surgeon: Cephus Shelling, MD;  Location: [facility] OR;  Service: Vascular;  Laterality: Right;   IR REMOVAL TUN CV CATH W/O FL  05/03/2021   KIDNEY TRANSPLANT     NEPHRECTOMY TRANSPLANTED ORGAN     pancrease transplant     PERIPHERAL VASCULAR BALLOON ANGIOPLASTY Right 02/17/2021   Procedure: PERIPHERAL VASCULAR BALLOON ANGIOPLASTY;  Surgeon: Cephus Shelling, MD;  [facility] LAB;  Service: Cardiovascular;  Laterality: Right;   REFRACTIVE SURGERY     REVISON OF ARTERIOVENOUS FISTULA Right 12/08/2020   Procedure: RIGHT ARM ARTERIOVENOUS FISTULA REVISION AND RESECTION;  Surgeon: Cephus Shelling, MD;  Location: [facility] OR;  Service: Vascular;  Laterality: Right;   REVISON OF ARTERIOVENOUS FISTULA Right 02/25/2021   Procedure: RIGHT ARM ARTERIOVENOUS FISTULA REVISION WITH TRANSPOSITION OF CEPHALIC VEIN ON AXILLARY VEIN;  Surgeon: Cephus Shelling, MD;  [facility] OR;  Service: Vascular;  Laterality: Right;   Social History:  reports that she has quit smoking. Her smoking use included pipe. She has never used smokeless tobacco. She reports that she does not drink  alcohol and does not use drugs.  No Known Allergies  Family History  Problem Relation Age of Onset   Hypertension Father     Prior to Admission medications   Medication Sig Start Date End Date Taking? Authorizing Provider  amLODipine (NORVASC) 10 MG tablet Take 1 tablet (10 mg total) by mouth daily. 02/05/22 05/06/22 Yes Dahal, Melina Schools, MD  aspirin 81 MG chewable tablet Chew 81 mg by mouth daily.   Yes [provider]  AURYXIA 1 GM 210 MG(Fe) tablet Take 630 mg by mouth 3 (three) times daily. 02/18/22  Yes [provider]  calcium acetate (PHOSLO) 667 MG capsule Take 4 capsules (2,668 mg total) by mouth 3 (three) times daily with meals. 02/05/22 05/06/22 Yes Dahal, Melina Schools, MD  Calcium Carbonate Antacid (CALCIUM CARBONATE, DOSED IN MG ELEMENTAL CALCIUM,) 1250 MG/5ML SUSP Take 5 mLs (500 mg of elemental calcium total) by mouth every 6 (six) hours as needed for indigestion. 02/05/22 05/06/22 Yes Dahal, Melina Schools, MD  cloNIDine (CATAPRES - DOSED IN MG/24 HR) 0.2 mg/24hr patch Place 1 patch (0.2 mg total) onto the skin once a week. 02/08/22 05/09/22 Yes Dahal, Melina Schools, MD  cycloSPORINE modified (NEORAL) 100 MG capsule Take 2 capsules (200 mg total) by mouth 2 (two) times daily. 12/25/21  Yes Pokhrel, Laxman, MD  cycloSPORINE modified (NEORAL) 25 MG capsule Take 25 mg by mouth daily.   Yes [provider]  DULoxetine (CYMBALTA) 20 MG capsule Take 1 capsule (20 mg total) by mouth daily. 02/06/22 05/07/22 Yes Dahal, Melina Schools, MD  hydrALAZINE (APRESOLINE) 25 MG tablet Take 3 tablets (75 mg total) by mouth every 8 (eight) hours. 02/05/22 05/06/22 Yes Dahal, Melina Schools, MD  lidocaine-prilocaine (EMLA) cream Apply 1 application topically daily as needed (port access). 07/09/20  Yes [provider]  LORazepam (ATIVAN) 0.5 MG tablet Take 0.5 mg by mouth every 6 (six) hours.   Yes [provider]  losartan (COZAAR) 100 MG tablet Take 1 tablet (100 mg total) by mouth daily. 12/04/21 03/04/22 Yes [place], Eric J, DO  metoCLOPramide (REGLAN) 5 MG tablet Take 5 mg by mouth every 6 (six) hours as needed for nausea or vomiting.   Yes [provider]  mycophenolate (MYFORTIC) 180 MG EC tablet Take 540 mg by mouth 2 (two) times daily.   Yes [provider]  ondansetron (ZOFRAN) 4 MG tablet Take 4 mg by mouth every 8 (eight) hours as needed for nausea or vomiting.   Yes [provider]  oxyCODONE-acetaminophen  (PERCOCET/ROXICET) 5-325 MG tablet Take 1 tablet by mouth every 6 (six) hours as needed for severe pain or moderate pain.   Yes [provider]  pantoprazole (PROTONIX) 40 MG tablet Take 1 tablet (40 mg total) by mouth daily. 02/06/22 05/07/22 Yes Dahal, Melina Schools, MD  predniSONE (DELTASONE) 5 MG tablet Take 15 mg by mouth daily. 06/24/15  Yes [provider]  sodium zirconium cyclosilicate (LOKELMA) 10 g PACK packet Take 10 g by mouth as directed. Take on Non-Dialysis (Sun,Tues, Thurs, Sat)   Yes [provider]  zolpidem (AMBIEN) 5 MG tablet Take 5 mg by mouth at bedtime. 12/07/21  Yes [provider]  carvedilol (COREG) 12.5 MG tablet Take 1 tablet (12.5 mg total) by mouth 2 (two) times daily with a meal. Patient not taking: Reported on 02/23/2022 02/05/22 05/06/22  Lorin Glass, MD  Darbepoetin Alfa (ARANESP) 60 MCG/0.3ML SOSY injection Inject 0.3 mLs (60 mcg total) into the vein every Friday with hemodialysis. 11/18/21   McClung,  Elpidio Eric, MD  doxercalciferol (HECTOROL) 4 MCG/2ML injection Inject 3 mLs (6 mcg total) into the vein every Monday, Wednesday, and Friday with hemodialysis. 11/17/21   Lonia Blood, MD    Physical Exam: Vitals:   02/24/22 0000 02/24/22 0015 02/24/22 0025 02/24/22 0200  BP: (!) 199/104 (!) 217/88  (!) 179/74  Pulse: 97 91 92 91  Resp:    16  Temp:      TempSrc:      SpO2: 98% 97% 99% 98%  Weight:      Height:       Constitutional: NAD, calm, comfortable Eyes: PERRL, lids and conjunctivae normal ENMT: Mucous membranes are moist. Posterior pharynx clear of any exudate or lesions.Normal dentition.  Neck: normal, supple, no masses, no thyromegaly Respiratory: clear to auscultation bilaterally, no wheezing, no crackles. Normal respiratory effort. No accessory muscle use.  Cardiovascular: Regular rate and rhythm, no murmurs / rubs / gallops. No extremity edema. 2+ pedal pulses. No carotid bruits.  Abdomen: no tenderness, no masses  palpated. No hepatosplenomegaly. Bowel sounds positive.  Musculoskeletal: no clubbing / cyanosis. No joint deformity upper and lower extremities. Good ROM, no contractures. Normal muscle tone.  Skin: no rashes, lesions, ulcers. No induration Neurologic: CN 2-12 grossly intact. Sensation intact, DTR normal. Strength 5/5 in all 4.  Psychiatric: Normal judgment and insight. Alert and oriented x 3. Normal mood.   Data Reviewed:    CXR showing pulmonary edema.     Latest Ref Rng & Units 02/23/2022    3:00 PM 02/05/2022    1:37 AM 02/04/2022   10:01 PM  BMP  Glucose 70 - 99 mg/dL 96  244    BUN 6 - 20 mg/dL 37  43    Creatinine 0.10 - 1.00 mg/dL 2.72  5.36    Sodium 644 - 145 mmol/L 145  135    Potassium 3.5 - 5.1 mmol/L 5.1  5.6  5.1   Chloride 98 - 111 mmol/L 104  93    CO2 22 - 32 mmol/L 26  27    Calcium 8.9 - 10.3 mg/dL 9.9  03.4     CBC    Component Value Date/Time   WBC 3.0 (L) 02/23/2022 1500   RBC 3.46 (L) 02/23/2022 1500   HGB 10.5 (L) 02/23/2022 1500   HCT 32.6 (L) 02/23/2022 1500   PLT 102 (L) 02/23/2022 1500   MCV 94.2 02/23/2022 1500   MCH 30.3 02/23/2022 1500   MCHC 32.2 02/23/2022 1500   RDW 12.9 02/23/2022 1500   LYMPHSABS 1.0 12/24/2021 0230   MONOABS 0.2 12/24/2021 0230   EOSABS 0.0 12/24/2021 0230   BASOSABS 0.0 12/24/2021 0230     Assessment and Plan: * Hypertensive urgency Due to non-adhearance with duration of dialysis it sounds like. Also not clear if she is or isnt adhearant to HTN meds at home Resume all home HTN meds PRN labetalol Needs further dialysis today.  ESRD on dialysis [facility]) Pt underwent some dialysis earlier today, unfortunately took herself off early after they only were able to get 2L of fluid off. Nephro consulted Will likely need further dialysis today.  Narcotic dependence (HCC) History of narcotic abuse in past according to multiple notes. Strong suspicion for ongoing dependence / abuse: 1) Per Dr. Ophelia Charter DC summary  01/13/22: "Of note, patient has had several admissions this year related to medication misuse (11/2021, responded to Narcan; 12/2021 Ambien misuse)" 2) Per Dr. Ophelia Charter' admission H+P 5/29: "-I have reviewed this patient in  the [place] Controlled Substances Reporting System.  She is receiving medications from multiple providers - concern for "doctor shopping" based on recent fill pattern -She is at increased risk of opioid misuse, diversion, or overdose." 3) She apparently informed someone last admit (see multiple provider notes stating that this was her "home dose") that she was being prescribed oxycodone 10mg  chronically.  I see no evidence of this on PMPAware, rather I see she is seeing multiple providers and getting short courses (5 days at a time) of Percocet 5s.  The last scripts she has for percocet and ativan was for 5 days only and that was at discharge from earlier this month.  Patient is currently "comfortable" after oxycodone 10mg  in ED given by EDP. I did warn her that I plan to order no further opiates from my end.  Specified that I was concerned about risk of misuse, overdose, and death. I told her that Id defer to the day team wether they felt comfortable or not ordering further opiates.  Pancreas transplant status (HCC) Continue cyclosporin, mycophenolate, and prednisone.  Type 1 diabetes (HCC) Not requiring any medical therapy at this time secondary to pancreas transplant.      Advance Care Planning:   Code Status: Full Code  Consults: Dr. Valentino Nose  Family Communication: No family in room  Severity of Illness: The appropriate patient status for this patient is OBSERVATION. Observation status is judged to be reasonable and necessary in order to provide the required intensity of service to ensure the patient's safety. The patient's presenting symptoms, physical exam findings, and initial radiographic and laboratory data in the context of their medical condition is felt to place them at  decreased risk for further clinical deterioration. Furthermore, it is anticipated that the patient will be medically stable for discharge from the hospital within 2 midnights of admission.   Author: Hillary Bow., DO 02/24/2022 2:37 AM  For on call review [email or web address].

## 2022-02-24 NOTE — Assessment & Plan Note (Signed)
Atypical. EKG with V6 T-wave inversion not present in May of this year, but present prior. Troponin negative. Discussed with cardiology who recommended an Transthoracic Echocardiogram to ensure no worsening pericardial effusion. Transthoracic Echocardiogram significant for seemingly improved effusion and no LV dysfunction or regional wall motion abnormality. Chest pain is reproducible on exam. Likely musculoskeletal.

## 2022-02-24 NOTE — ED Notes (Signed)
Patient refused cardiac monitoring, states when she tries to go to sleep "it makes my anxiety worse." Patient did agree to BP and O2 monitoring.

## 2022-02-25 LAB — HEPATITIS B SURFACE ANTIBODY, QUANTITATIVE: Hep B S AB Quant (Post): 305.7 m[IU]/mL (ref >9.9)

## 2022-03-05 ENCOUNTER — Encounter (HOSPITAL_COMMUNITY): Payer: Self-pay

## 2022-03-05 ENCOUNTER — Emergency Department (HOSPITAL_COMMUNITY)
Admission: EM | Admit: 2022-03-05 | Discharge: 2022-03-06 | Disposition: A | Discharge status: Home / Self Care | Payer: Medicare Other | Source: Home / Self Care | Attending: Emergency Medicine | Admitting: Emergency Medicine

## 2022-03-05 DIAGNOSIS — M549 Dorsalgia, unspecified: Secondary | ICD-10-CM | POA: Insufficient documentation

## 2022-03-05 DIAGNOSIS — Z992 Dependence on renal dialysis: Secondary | ICD-10-CM | POA: Insufficient documentation

## 2022-03-05 DIAGNOSIS — R079 Chest pain, unspecified: Secondary | ICD-10-CM | POA: Insufficient documentation

## 2022-03-05 DIAGNOSIS — I120 Hypertensive chronic kidney disease with stage 5 chronic kidney disease or end stage renal disease: Secondary | ICD-10-CM | POA: Insufficient documentation

## 2022-03-05 DIAGNOSIS — N186 End stage renal disease: Secondary | ICD-10-CM | POA: Insufficient documentation

## 2022-03-05 DIAGNOSIS — E1022 Type 1 diabetes mellitus with diabetic chronic kidney disease: Secondary | ICD-10-CM | POA: Diagnosis not present

## 2022-03-05 DIAGNOSIS — R109 Unspecified abdominal pain: Secondary | ICD-10-CM | POA: Insufficient documentation

## 2022-03-05 DIAGNOSIS — E875 Hyperkalemia: Secondary | ICD-10-CM | POA: Diagnosis not present

## 2022-03-05 DIAGNOSIS — J810 Acute pulmonary edema: Secondary | ICD-10-CM | POA: Diagnosis not present

## 2022-03-05 DIAGNOSIS — E1043 Type 1 diabetes mellitus with diabetic autonomic (poly)neuropathy: Secondary | ICD-10-CM | POA: Diagnosis not present

## 2022-03-05 DIAGNOSIS — I160 Hypertensive urgency: Secondary | ICD-10-CM | POA: Diagnosis not present

## 2022-03-05 DIAGNOSIS — K3184 Gastroparesis: Secondary | ICD-10-CM | POA: Diagnosis not present

## 2022-03-05 DIAGNOSIS — E1122 Type 2 diabetes mellitus with diabetic chronic kidney disease: Secondary | ICD-10-CM | POA: Diagnosis not present

## 2022-03-05 DIAGNOSIS — Z9483 Pancreas transplant status: Secondary | ICD-10-CM | POA: Diagnosis not present

## 2022-03-05 DIAGNOSIS — J90 Pleural effusion, not elsewhere classified: Secondary | ICD-10-CM | POA: Diagnosis not present

## 2022-03-05 DIAGNOSIS — Z87891 Personal history of nicotine dependence: Secondary | ICD-10-CM | POA: Insufficient documentation

## 2022-03-05 DIAGNOSIS — I700 Atherosclerosis of aorta: Secondary | ICD-10-CM | POA: Diagnosis not present

## 2022-03-05 DIAGNOSIS — Z940 Kidney transplant status: Secondary | ICD-10-CM | POA: Diagnosis not present

## 2022-03-05 DIAGNOSIS — R52 Pain, unspecified: Secondary | ICD-10-CM

## 2022-03-05 NOTE — ED Triage Notes (Signed)
Pt comes via [facility] for CP that radiates to her back since yesterday. Pt is a dialysis pt MWF, PTA received 324 ASA

## 2022-03-06 ENCOUNTER — Emergency Department (HOSPITAL_COMMUNITY): Payer: Medicare Other

## 2022-03-06 ENCOUNTER — Other Ambulatory Visit: Payer: Self-pay

## 2022-03-06 ENCOUNTER — Observation Stay (HOSPITAL_COMMUNITY)
Admission: EM | Admit: 2022-03-06 | Discharge: 2022-03-08 | Disposition: A | Discharge status: Home / Self Care | Payer: Medicare Other | Attending: Internal Medicine | Admitting: Internal Medicine

## 2022-03-06 DIAGNOSIS — N186 End stage renal disease: Secondary | ICD-10-CM | POA: Diagnosis not present

## 2022-03-06 DIAGNOSIS — I160 Hypertensive urgency: Secondary | ICD-10-CM | POA: Insufficient documentation

## 2022-03-06 DIAGNOSIS — E8770 Fluid overload, unspecified: Secondary | ICD-10-CM

## 2022-03-06 DIAGNOSIS — Z940 Kidney transplant status: Secondary | ICD-10-CM | POA: Insufficient documentation

## 2022-03-06 DIAGNOSIS — Z87891 Personal history of nicotine dependence: Secondary | ICD-10-CM | POA: Insufficient documentation

## 2022-03-06 DIAGNOSIS — K3184 Gastroparesis: Secondary | ICD-10-CM | POA: Insufficient documentation

## 2022-03-06 DIAGNOSIS — E1022 Type 1 diabetes mellitus with diabetic chronic kidney disease: Secondary | ICD-10-CM | POA: Insufficient documentation

## 2022-03-06 DIAGNOSIS — I700 Atherosclerosis of aorta: Secondary | ICD-10-CM | POA: Insufficient documentation

## 2022-03-06 DIAGNOSIS — G8929 Other chronic pain: Secondary | ICD-10-CM | POA: Diagnosis present

## 2022-03-06 DIAGNOSIS — Z9483 Pancreas transplant status: Secondary | ICD-10-CM | POA: Insufficient documentation

## 2022-03-06 DIAGNOSIS — N289 Disorder of kidney and ureter, unspecified: Secondary | ICD-10-CM

## 2022-03-06 DIAGNOSIS — E1043 Type 1 diabetes mellitus with diabetic autonomic (poly)neuropathy: Secondary | ICD-10-CM | POA: Insufficient documentation

## 2022-03-06 DIAGNOSIS — J811 Chronic pulmonary edema: Secondary | ICD-10-CM | POA: Diagnosis present

## 2022-03-06 DIAGNOSIS — E875 Hyperkalemia: Secondary | ICD-10-CM | POA: Insufficient documentation

## 2022-03-06 DIAGNOSIS — E1122 Type 2 diabetes mellitus with diabetic chronic kidney disease: Secondary | ICD-10-CM | POA: Insufficient documentation

## 2022-03-06 DIAGNOSIS — Z992 Dependence on renal dialysis: Secondary | ICD-10-CM | POA: Insufficient documentation

## 2022-03-06 DIAGNOSIS — J90 Pleural effusion, not elsewhere classified: Secondary | ICD-10-CM | POA: Insufficient documentation

## 2022-03-06 DIAGNOSIS — T8691 Unspecified transplanted organ and tissue rejection: Secondary | ICD-10-CM | POA: Diagnosis present

## 2022-03-06 DIAGNOSIS — J810 Acute pulmonary edema: Secondary | ICD-10-CM | POA: Insufficient documentation

## 2022-03-06 DIAGNOSIS — E1129 Type 2 diabetes mellitus with other diabetic kidney complication: Secondary | ICD-10-CM | POA: Diagnosis present

## 2022-03-06 LAB — COMPREHENSIVE METABOLIC PANEL
ALT: 10 U/L (ref 0–44)
AST: 10 U/L — ABNORMAL LOW (ref 15–41)
Albumin: 3.7 g/dL (ref 3.5–5.0)
Alkaline Phosphatase: 58 U/L (ref 38–126)
Anion gap: 17 — ABNORMAL HIGH (ref 5–15)
BUN: 59 mg/dL — ABNORMAL HIGH (ref 6–20)
CO2: 22 mmol/L (ref 22–32)
Calcium: 9.7 mg/dL (ref 8.9–10.3)
Chloride: 101 mmol/L (ref 98–111)
Creatinine, Ser: 9.59 mg/dL — ABNORMAL HIGH (ref 0.44–1.00)
GFR, Estimated: 5 mL/min — ABNORMAL LOW (ref >60)
Glucose, Bld: 108 mg/dL — ABNORMAL HIGH (ref 70–99)
Potassium: 5.8 mmol/L — ABNORMAL HIGH (ref 3.5–5.1)
Sodium: 140 mmol/L (ref 135–145)
Total Bilirubin: 1.2 mg/dL (ref 0.3–1.2)
Total Protein: 7.1 g/dL (ref 6.5–8.1)

## 2022-03-06 LAB — CBC
HCT: 29.1 % — ABNORMAL LOW (ref 36.0–46.0)
HCT: 32.9 % — ABNORMAL LOW (ref 36.0–46.0)
Hemoglobin: 10.5 g/dL — ABNORMAL LOW (ref 12.0–15.0)
Hemoglobin: 9.4 g/dL — ABNORMAL LOW (ref 12.0–15.0)
MCH: 29.9 pg (ref 26.0–34.0)
MCH: 30.2 pg (ref 26.0–34.0)
MCHC: 31.9 g/dL (ref 30.0–36.0)
MCHC: 32.3 g/dL (ref 30.0–36.0)
MCV: 92.7 fL (ref 80.0–100.0)
MCV: 94.5 fL (ref 80.0–100.0)
Platelets: 129 10*3/uL — ABNORMAL LOW (ref 150–400)
Platelets: 139 10*3/uL — ABNORMAL LOW (ref 150–400)
RBC: 3.14 MIL/uL — ABNORMAL LOW (ref 3.87–5.11)
RBC: 3.48 MIL/uL — ABNORMAL LOW (ref 3.87–5.11)
RDW: 13.6 % (ref 11.5–15.5)
RDW: 13.9 % (ref 11.5–15.5)
WBC: 4.7 10*3/uL (ref 4.0–10.5)
WBC: 7.4 10*3/uL (ref 4.0–10.5)
nRBC: 0 % (ref 0.0–0.2)
nRBC: 0 % (ref 0.0–0.2)

## 2022-03-06 LAB — TROPONIN I (HIGH SENSITIVITY)
Troponin I (High Sensitivity): 17 ng/L (ref <18)
Troponin I (High Sensitivity): 18 ng/L — ABNORMAL HIGH (ref <18)
Troponin I (High Sensitivity): 26 ng/L — ABNORMAL HIGH (ref <18)
Troponin I (High Sensitivity): 30 ng/L — ABNORMAL HIGH (ref <18)

## 2022-03-06 LAB — BASIC METABOLIC PANEL
Anion gap: 16 — ABNORMAL HIGH (ref 5–15)
BUN: 49 mg/dL — ABNORMAL HIGH (ref 6–20)
CO2: 22 mmol/L (ref 22–32)
Calcium: 9.7 mg/dL (ref 8.9–10.3)
Chloride: 101 mmol/L (ref 98–111)
Creatinine, Ser: 8.09 mg/dL — ABNORMAL HIGH (ref 0.44–1.00)
GFR, Estimated: 6 mL/min — ABNORMAL LOW (ref >60)
Glucose, Bld: 108 mg/dL — ABNORMAL HIGH (ref 70–99)
Potassium: 5 mmol/L (ref 3.5–5.1)
Sodium: 139 mmol/L (ref 135–145)

## 2022-03-06 LAB — I-STAT BETA HCG BLOOD, ED (MC, WL, AP ONLY): I-stat hCG, quantitative: <5 m[IU]/mL (ref <5)

## 2022-03-06 LAB — HCG, QUANTITATIVE, PREGNANCY: hCG, Beta Chain, Quant, S: 2 m[IU]/mL (ref <5)

## 2022-03-06 MED ORDER — HYDROMORPHONE HCL 1 MG/ML IJ SOLN
0.5000 mg | Freq: Once | INTRAMUSCULAR | Status: AC
  Administered 2022-03-06: 0.5 mg via INTRAVENOUS
  Filled 2022-03-06: qty 1

## 2022-03-06 MED ORDER — HALOPERIDOL LACTATE 5 MG/ML IJ SOLN
2.0000 mg | Freq: Once | INTRAMUSCULAR | Status: AC
  Administered 2022-03-06: 2 mg via INTRAVENOUS
  Filled 2022-03-06: qty 1

## 2022-03-06 MED ORDER — HYDROMORPHONE HCL 1 MG/ML IJ SOLN: 1.0000 mg | Freq: Once | INTRAMUSCULAR | Status: DC

## 2022-03-06 MED ORDER — HYDROMORPHONE HCL 1 MG/ML IJ SOLN
2.0000 mg | Freq: Once | INTRAMUSCULAR | Status: AC
  Administered 2022-03-06: 2 mg via INTRAVENOUS
  Filled 2022-03-06: qty 2

## 2022-03-06 MED ORDER — LABETALOL HCL 5 MG/ML IV SOLN
20.0000 mg | Freq: Once | INTRAVENOUS | Status: AC
Start: 2022-03-06 — End: 2022-03-06
  Administered 2022-03-06: 20 mg via INTRAVENOUS
  Filled 2022-03-06: qty 4

## 2022-03-06 NOTE — Discharge Instructions (Signed)
You were evaluated in the Emergency Department and after careful evaluation, we did not find any emergent condition requiring admission or further testing in the hospital.  Your exam/testing today was overall reassuring.  Recommend continued dialysis, follow-up with your regular doctors.  Please return to the Emergency Department if you experience any worsening of your condition.  Thank you for allowing [place] to be a part of your care.

## 2022-03-06 NOTE — ED Provider Notes (Signed)
[facility] Emergency Department Provider Note MRN:  [id number]  Arrival date & time: 03/06/22     Chief Complaint   Chest Pain   History of Present Illness   [name] [name] is a 41 y.o. year-old female with a history of ESRD, gastroparesis, chronic pain presenting to the ED with chief complaint of chest pain.  Chest pain, abdominal pain, back pain, severe, screaming.  Has happened before.  Review of Systems  A thorough review of systems was obtained and all systems are negative except as noted in the HPI and PMH.   Patient's Health History    Past Medical History:  Diagnosis Date   Anemia    Anxiety    ESRD (end stage renal disease) ([place])    Henmodialysis   Gastroparesis    Hypertension    Narcotic abuse ([place])    Non compliance with medical treatment    Renal disorder 2015   transplant   Vision loss     Past Surgical History:  Procedure Laterality Date   A/V FISTULAGRAM Right 02/17/2021   Procedure: A/V FISTULAGRAM;  Surgeon: [name], MD;  Location: [place] CV LAB;  Service: Cardiovascular;  Laterality: Right;   AV FISTULA PLACEMENT  11/17/2011   Procedure: ARTERIOVENOUS (AV) FISTULA CREATION;  Surgeon: Rosetta Posner, MD;  Location: [place];  Service: Vascular;  Laterality: Right;   BIOPSY  12/03/2021   Procedure: BIOPSY;  Surgeon: Doran Stabler, MD;  Location: [place];  Service: Gastroenterology;;   ESOPHAGOGASTRODUODENOSCOPY (EGD) WITH PROPOFOL N/A 12/03/2021   Procedure: ESOPHAGOGASTRODUODENOSCOPY (EGD) WITH PROPOFOL;  Surgeon: Doran Stabler, MD;  Location: [place];  Service: Gastroenterology;  Laterality: N/A;   EYE SURGERY     HEMATOMA EVACUATION Right 02/25/2021   Procedure: EVACUATION HEMATOMA RIGHT CHEST;  Surgeon: Waynetta Sandy, MD;  Location: [place];  Service: Vascular;  Laterality: Right;   INSERTION OF DIALYSIS CATHETER Right 12/08/2020   Procedure: INSERTION OF TUNNELED DIALYSIS CATHETER;   Surgeon: [name], MD;  Location: [facility] OR;  Service: Vascular;  Laterality: Right;   IR REMOVAL TUN CV CATH W/O FL  05/03/2021   KIDNEY TRANSPLANT     NEPHRECTOMY TRANSPLANTED ORGAN     pancrease transplant     PERIPHERAL VASCULAR BALLOON ANGIOPLASTY Right 02/17/2021   Procedure: PERIPHERAL VASCULAR BALLOON ANGIOPLASTY;  Surgeon: [name], MD;  Location: [place] CV LAB;  Service: Cardiovascular;  Laterality: Right;   REFRACTIVE SURGERY     REVISON OF ARTERIOVENOUS FISTULA Right 12/08/2020   Procedure: RIGHT ARM ARTERIOVENOUS FISTULA REVISION AND RESECTION;  Surgeon: [name], MD;  Location: [facility] OR;  Service: Vascular;  Laterality: Right;   REVISON OF ARTERIOVENOUS FISTULA Right 02/25/2021   Procedure: RIGHT ARM ARTERIOVENOUS FISTULA REVISION WITH TRANSPOSITION OF CEPHALIC VEIN ON AXILLARY VEIN;  Surgeon: [name], MD;  [facility] OR;  Service: Vascular;  Laterality: Right;    Family History  Problem Relation Age of Onset   Hypertension Father     Social History   Socioeconomic History   Marital status: Married    Spouse name: Not on file   Number of children: Not on file   Years of education: Not on file   Highest education level: Not on file  Occupational History   Not on file  Tobacco Use   Smoking status: Former    Types: Pipe   Smokeless tobacco: Never   Tobacco comments:    Hooka  Vaping Use   Vaping Use: Never used  Substance and Sexual Activity   Alcohol use: No   Drug use: No   Sexual activity: Not Currently  Other Topics Concern   Not on file  Social History Narrative   Worked for [employer]--as did husband. Had to leave [place] for safety reasons. Has been in [place] since 9/09.   Social Determinants of Health   Financial Resource Strain: Not on file  Food Insecurity: Not on file  Transportation Needs: Not on file  Physical Activity: Not on file  Stress: Not on file  Social Connections: Not on file  Intimate Partner  Violence: Not on file     Physical Exam   Vitals:   03/06/22 0400 03/06/22 0445  BP: (!) 182/87 (!) 187/87  Pulse: 89 92  Resp: 16 (!) 21  Temp:    SpO2: 91% 93%    CONSTITUTIONAL: Well-appearing, in moderate distress due to pain NEURO/PSYCH:  Alert and oriented x 3, no focal deficits EYES:  eyes equal and reactive ENT/NECK:  no LAD, no JVD CARDIO: Regular rate, well-perfused, normal S1 and S2 PULM:  CTAB no wheezing or rhonchi GI/GU:  non-distended, non-tender MSK/SPINE:  No gross deformities, no edema SKIN:  no rash, atraumatic   *Additional and/or pertinent findings included in MDM below  Diagnostic and Interventional Summary    EKG Interpretation  Date/Time:  Monday March 06 2022 02:13:23 EDT Ventricular Rate:  94 PR Interval:  138 QRS Duration: 77 QT Interval:  384 QTC Calculation: 481 R Axis:   55 Text Interpretation: Sinus rhythm Probable left ventricular hypertrophy Abnormal T, consider ischemia, lateral leads Confirmed by Gerlene Fee [phone]) on 03/06/2022 4:08:09 AM       Labs Reviewed  BASIC METABOLIC PANEL - Abnormal; Notable for the following components:      Result Value   Glucose, Bld 108 (*)    BUN 49 (*)    Creatinine, Ser 8.09 (*)    GFR, Estimated 6 (*)    Anion gap 16 (*)    All other components within normal limits  CBC - Abnormal; Notable for the following components:   RBC 3.14 (*)    Hemoglobin 9.4 (*)    HCT 29.1 (*)    Platelets 129 (*)    All other components within normal limits  TROPONIN I (HIGH SENSITIVITY) - Abnormal; Notable for the following components:   Troponin I (High Sensitivity) 18 (*)    All other components within normal limits  HCG, QUANTITATIVE, PREGNANCY  TROPONIN I (HIGH SENSITIVITY)    DG Chest 2 View  Final Result      Medications  HYDROmorphone (DILAUDID) injection 0.5 mg (has no administration in time range)  HYDROmorphone (DILAUDID) injection 0.5 mg (0.5 mg Intravenous Given 03/06/22 0250)  haloperidol  lactate (HALDOL) injection 2 mg (2 mg Intravenous Given 03/06/22 0253)  labetalol (NORMODYNE) injection 20 mg (20 mg Intravenous Given 03/06/22 0334)  HYDROmorphone (DILAUDID) injection 2 mg (2 mg Intravenous Given 03/06/22 8295)     Procedures  /  Critical Care Procedures  ED Course and Medical Decision Making  Initial Impression and Ddx Acute chest, abdomen, back pain, 41 year old female with ESRD.  Has had similar presentations in the past with negative work-ups, including CT dissection studies.  Recent admission for hypertension needing dialysis.  Patient provided with symptom control.  Labs are reassuring without significant blood count or electrolyte disturbance, BUN/creatinine at or near baseline.  Troponin minimally elevated and flat upon repeat.  Past medical/surgical history that increases complexity of ED encounter: ESRD  Interpretation of Diagnostics I personally reviewed the EKG and my interpretation is as follows: Sinus rhythm  See above for laboratory interpretation.  Patient Reassessment and Ultimate Disposition/Management     Patient feeling much better, requesting discharge.  Abdomen continues to be soft and nontender, no indication for further testing or admission.  Patient management required discussion with the following services or consulting groups:  None  Complexity of Problems Addressed Acute illness or injury that poses threat of life of bodily function  Additional Data Reviewed and Analyzed Further history obtained from: Recent discharge summary  Additional Factors Impacting ED Encounter Risk Use of parenteral controlled substances  Barth Kirks. [place] Small, [place] [email or web address]  Final Clinical Impressions(s) / ED Diagnoses     ICD-10-CM   1. Diffuse pain  R52       ED Discharge Orders     None        Discharge Instructions Discussed with and Provided to Patient:     Discharge  Instructions      You were evaluated in the Emergency Department and after careful evaluation, we did not find any emergent condition requiring admission or further testing in the hospital.  Your exam/testing today was overall reassuring.  Recommend continued dialysis, follow-up with your regular doctors.  Please return to the Emergency Department if you experience any worsening of your condition.  Thank you for allowing [place] to be a part of your care.        Maudie Flakes, MD 03/06/22 0530

## 2022-03-06 NOTE — ED Provider Triage Note (Signed)
Emergency Medicine Provider Triage Evaluation Note  [name] [name] , a 41 y.o. female  was evaluated in triage.  Pt complains of chest pain, abdominal pain for the last 24 hours.  She was seen and evaluated yesterday with unremarkable findings.  She is a dialysis patient, reports she missed dialysis this morning secondary to pain.  She is hypertensive with systolic of 395 in triage, heart rate is 118.  She reports last dialysis was Friday.  History of gastroparesis, narcotic abuse, ESRD, hypertension, anxiety, anemia.  She did not take her blood pressure medication today..  Review of Systems  Positive: Chest pain, abdominal pain Negative: Fever, chills, headache  Physical Exam  BP (!) 219/109 (BP Location: Left Arm)   Pulse (!) 118   Temp 98.8 F (37.1 C) (Oral)   Resp 16   SpO2 98%  Gen:   Awake, chronically ill, somewhat distressed Resp:  Normal effort  MSK:   Moves extremities without difficulty  Other:  TTP in abdomen. Some guarding. Tachycardic with regular rhythm.  Medical Decision Making  Medically screening exam initiated at 5:25 PM.  Appropriate orders placed.  [name] [name] was informed that the remainder of the evaluation will be completed by another provider, this initial triage assessment does not replace that evaluation, and the importance of remaining in the ED until their evaluation is complete.  Workup initiated   Anselmo Pickler, [place] 03/06/22 1729

## 2022-03-06 NOTE — ED Notes (Addendum)
Pt oxygen tank changed 

## 2022-03-06 NOTE — ED Triage Notes (Signed)
Pt here with erports of gen adb pain X24 hours. Seen yesterday for same. Pt missed dialysis today. Pt lad dialysis was Friday.  220/110 HR 110

## 2022-03-07 ENCOUNTER — Encounter (HOSPITAL_COMMUNITY): Payer: Self-pay | Admitting: Family Medicine

## 2022-03-07 DIAGNOSIS — I160 Hypertensive urgency: Secondary | ICD-10-CM

## 2022-03-07 DIAGNOSIS — E8770 Fluid overload, unspecified: Secondary | ICD-10-CM

## 2022-03-07 DIAGNOSIS — G8929 Other chronic pain: Secondary | ICD-10-CM | POA: Diagnosis not present

## 2022-03-07 DIAGNOSIS — N186 End stage renal disease: Principal | ICD-10-CM | POA: Diagnosis present

## 2022-03-07 DIAGNOSIS — N289 Disorder of kidney and ureter, unspecified: Secondary | ICD-10-CM

## 2022-03-07 DIAGNOSIS — K3184 Gastroparesis: Secondary | ICD-10-CM

## 2022-03-07 DIAGNOSIS — E1129 Type 2 diabetes mellitus with other diabetic kidney complication: Secondary | ICD-10-CM

## 2022-03-07 DIAGNOSIS — E875 Hyperkalemia: Secondary | ICD-10-CM

## 2022-03-07 DIAGNOSIS — J810 Acute pulmonary edema: Secondary | ICD-10-CM

## 2022-03-07 DIAGNOSIS — T8691 Unspecified transplanted organ and tissue rejection: Secondary | ICD-10-CM

## 2022-03-07 DIAGNOSIS — E1043 Type 1 diabetes mellitus with diabetic autonomic (poly)neuropathy: Secondary | ICD-10-CM

## 2022-03-07 DIAGNOSIS — Z992 Dependence on renal dialysis: Secondary | ICD-10-CM

## 2022-03-07 MED ORDER — OXYCODONE HCL 5 MG PO TABS
5.0000 mg | ORAL_TABLET | Freq: Two times a day (BID) | ORAL | Status: DC | PRN
  Administered 2022-03-07: 5 mg via ORAL
  Filled 2022-03-07: qty 1

## 2022-03-07 MED ORDER — SODIUM CHLORIDE 0.9% FLUSH
3.0000 mL | Freq: Two times a day (BID) | INTRAVENOUS | Status: DC
  Administered 2022-03-07 – 2022-03-08 (×3): 3 mL via INTRAVENOUS

## 2022-03-07 MED ORDER — LORAZEPAM 2 MG/ML IJ SOLN
1.0000 mg | Freq: Once | INTRAMUSCULAR | Status: AC
Start: 2022-03-07 — End: 2022-03-07
  Administered 2022-03-07: 1 mg via INTRAVENOUS
  Filled 2022-03-07: qty 1

## 2022-03-07 MED ORDER — DOXERCALCIFEROL 4 MCG/2ML IV SOLN
8.0000 ug | INTRAVENOUS | Status: DC
Start: 2022-03-08 — End: 2022-03-08
  Filled 2022-03-07: qty 4

## 2022-03-07 MED ORDER — HEPARIN SODIUM (PORCINE) 5000 UNIT/ML IJ SOLN
5000.0000 [IU] | Freq: Three times a day (TID) | INTRAMUSCULAR | Status: DC
  Administered 2022-03-07: 5000 [IU] via SUBCUTANEOUS
  Filled 2022-03-07 (×3): qty 1

## 2022-03-07 MED ORDER — CYCLOSPORINE MODIFIED (NEORAL) 100 MG PO CAPS
200.0000 mg | ORAL_CAPSULE | Freq: Two times a day (BID) | ORAL | Status: DC
  Administered 2022-03-07 – 2022-03-08 (×3): 200 mg via ORAL
  Filled 2022-03-07 (×5): qty 2

## 2022-03-07 MED ORDER — HYDRALAZINE HCL 50 MG PO TABS
75.0000 mg | ORAL_TABLET | Freq: Three times a day (TID) | ORAL | Status: DC
  Administered 2022-03-07 – 2022-03-08 (×5): 75 mg via ORAL
  Filled 2022-03-07 (×2): qty 1
  Filled 2022-03-07: qty 3
  Filled 2022-03-07 (×2): qty 1

## 2022-03-07 MED ORDER — OXYCODONE-ACETAMINOPHEN 5-325 MG PO TABS
1.0000 | ORAL_TABLET | Freq: Three times a day (TID) | ORAL | Status: DC
  Administered 2022-03-07 – 2022-03-08 (×2): 1 via ORAL
  Filled 2022-03-07 (×2): qty 1

## 2022-03-07 MED ORDER — CALCIUM CARBONATE ANTACID 1250 MG/5ML PO SUSP: 500.0000 mg | Freq: Four times a day (QID) | ORAL | Status: DC | PRN

## 2022-03-07 MED ORDER — ACETAMINOPHEN 325 MG PO TABS
650.0000 mg | ORAL_TABLET | Freq: Once | ORAL | Status: AC
  Administered 2022-03-07: 650 mg via ORAL
  Filled 2022-03-07: qty 2

## 2022-03-07 MED ORDER — LIDOCAINE HCL (PF) 1 % IJ SOLN: 5.0000 mL | INTRAMUSCULAR | Status: DC | PRN

## 2022-03-07 MED ORDER — SODIUM ZIRCONIUM CYCLOSILICATE 10 G PO PACK
10.0000 g | PACK | Freq: Once | ORAL | Status: AC
Start: 2022-03-07 — End: 2022-03-07
  Administered 2022-03-07: 10 g via ORAL
  Filled 2022-03-07: qty 1

## 2022-03-07 MED ORDER — ACETAMINOPHEN 325 MG PO TABS
650.0000 mg | ORAL_TABLET | Freq: Four times a day (QID) | ORAL | Status: DC | PRN
  Filled 2022-03-07: qty 2

## 2022-03-07 MED ORDER — ZOLPIDEM TARTRATE 5 MG PO TABS
5.0000 mg | ORAL_TABLET | Freq: Every day | ORAL | Status: DC
  Administered 2022-03-07: 5 mg via ORAL
  Filled 2022-03-07: qty 1

## 2022-03-07 MED ORDER — LABETALOL HCL 5 MG/ML IV SOLN
10.0000 mg | INTRAVENOUS | Status: DC | PRN
  Administered 2022-03-08: 10 mg via INTRAVENOUS
  Filled 2022-03-07 (×2): qty 4

## 2022-03-07 MED ORDER — CALCIUM ACETATE (PHOS BINDER) 667 MG PO CAPS: 2668.0000 mg | ORAL_CAPSULE | Freq: Three times a day (TID) | ORAL | Status: DC

## 2022-03-07 MED ORDER — PANTOPRAZOLE SODIUM 40 MG PO TBEC
40.0000 mg | DELAYED_RELEASE_TABLET | Freq: Every day | ORAL | Status: DC
  Administered 2022-03-07 – 2022-03-08 (×2): 40 mg via ORAL
  Filled 2022-03-07 (×2): qty 1

## 2022-03-07 MED ORDER — LOSARTAN POTASSIUM 50 MG PO TABS: 100.0000 mg | ORAL_TABLET | Freq: Two times a day (BID) | ORAL | Status: DC

## 2022-03-07 MED ORDER — LORAZEPAM 0.5 MG PO TABS
0.5000 mg | ORAL_TABLET | Freq: Two times a day (BID) | ORAL | Status: DC | PRN
Start: 2022-03-07 — End: 2022-03-08
  Administered 2022-03-07 – 2022-03-08 (×2): 0.5 mg via ORAL
  Filled 2022-03-07: qty 0.5
  Filled 2022-03-07: qty 1

## 2022-03-07 MED ORDER — OXYCODONE HCL 5 MG PO TABS
5.0000 mg | ORAL_TABLET | Freq: Four times a day (QID) | ORAL | Status: DC | PRN
  Administered 2022-03-08: 5 mg via ORAL
  Filled 2022-03-07: qty 1

## 2022-03-07 MED ORDER — PENTAFLUOROPROP-TETRAFLUOROETH EX AERO: 1.0000 | INHALATION_SPRAY | CUTANEOUS | Status: DC | PRN

## 2022-03-07 MED ORDER — SODIUM ZIRCONIUM CYCLOSILICATE 10 G PO PACK: 10.0000 g | PACK | ORAL | Status: DC

## 2022-03-07 MED ORDER — LIDOCAINE-PRILOCAINE 2.5-2.5 % EX CREA
1.0000 | TOPICAL_CREAM | CUTANEOUS | Status: DC | PRN
Start: 2022-03-07 — End: 2022-03-08

## 2022-03-07 MED ORDER — FERRIC CITRATE 1 GM 210 MG(FE) PO TABS
630.0000 mg | ORAL_TABLET | Freq: Three times a day (TID) | ORAL | Status: DC
  Administered 2022-03-07: 630 mg via ORAL
  Filled 2022-03-07 (×2): qty 3

## 2022-03-07 MED ORDER — PREDNISONE 5 MG PO TABS
15.0000 mg | ORAL_TABLET | Freq: Every day | ORAL | Status: DC
  Administered 2022-03-07 – 2022-03-08 (×2): 15 mg via ORAL
  Filled 2022-03-07 (×2): qty 1

## 2022-03-07 MED ORDER — PROMETHAZINE HCL 25 MG PO TABS
25.0000 mg | ORAL_TABLET | Freq: Once | ORAL | Status: AC
Start: 2022-03-07 — End: 2022-03-07
  Administered 2022-03-07: 25 mg via ORAL
  Filled 2022-03-07: qty 1

## 2022-03-07 MED ORDER — AMLODIPINE BESYLATE 10 MG PO TABS
10.0000 mg | ORAL_TABLET | Freq: Every day | ORAL | Status: DC
  Administered 2022-03-07 – 2022-03-08 (×2): 10 mg via ORAL
  Filled 2022-03-07 (×2): qty 1

## 2022-03-07 MED ORDER — CYCLOSPORINE MODIFIED (NEORAL) 25 MG PO CAPS
25.0000 mg | ORAL_CAPSULE | Freq: Every day | ORAL | Status: DC
  Administered 2022-03-08: 25 mg via ORAL
  Filled 2022-03-07 (×2): qty 1

## 2022-03-07 MED ORDER — CLONIDINE HCL 0.2 MG/24HR TD PTWK
0.2000 mg | MEDICATED_PATCH | TRANSDERMAL | Status: DC
  Administered 2022-03-07: 0.2 mg via TRANSDERMAL
  Filled 2022-03-07 (×2): qty 1

## 2022-03-07 MED ORDER — MYCOPHENOLATE SODIUM 180 MG PO TBEC
540.0000 mg | DELAYED_RELEASE_TABLET | Freq: Two times a day (BID) | ORAL | Status: DC
  Administered 2022-03-07 – 2022-03-08 (×3): 540 mg via ORAL
  Filled 2022-03-07 (×5): qty 3

## 2022-03-07 MED ORDER — DULOXETINE HCL 20 MG PO CPEP
20.0000 mg | ORAL_CAPSULE | Freq: Every day | ORAL | Status: DC
  Administered 2022-03-08: 20 mg via ORAL
  Filled 2022-03-07 (×3): qty 1

## 2022-03-07 MED ORDER — CINACALCET HCL 30 MG PO TABS
90.0000 mg | ORAL_TABLET | ORAL | Status: DC
  Filled 2022-03-07: qty 3

## 2022-03-07 MED ORDER — ASPIRIN 81 MG PO CHEW
81.0000 mg | CHEWABLE_TABLET | Freq: Every day | ORAL | Status: DC
  Administered 2022-03-07 – 2022-03-08 (×2): 81 mg via ORAL
  Filled 2022-03-07 (×2): qty 1

## 2022-03-07 MED ORDER — ONDANSETRON HCL 4 MG PO TABS: 4.0000 mg | ORAL_TABLET | Freq: Three times a day (TID) | ORAL | Status: DC | PRN

## 2022-03-07 MED ORDER — ACETAMINOPHEN 650 MG RE SUPP: 650.0000 mg | Freq: Four times a day (QID) | RECTAL | Status: DC | PRN

## 2022-03-07 MED ORDER — METOCLOPRAMIDE HCL 5 MG PO TABS
5.0000 mg | ORAL_TABLET | Freq: Four times a day (QID) | ORAL | Status: DC | PRN
  Administered 2022-03-07: 5 mg via ORAL
  Filled 2022-03-07: qty 1

## 2022-03-07 MED ORDER — CARVEDILOL 12.5 MG PO TABS
12.5000 mg | ORAL_TABLET | Freq: Two times a day (BID) | ORAL | Status: DC
  Administered 2022-03-07: 12.5 mg via ORAL
  Filled 2022-03-07 (×2): qty 1

## 2022-03-07 MED ORDER — DOXERCALCIFEROL 4 MCG/2ML IV SOLN
6.0000 ug | INTRAVENOUS | Status: DC
Start: 2022-03-08 — End: 2022-03-07

## 2022-03-07 NOTE — Procedures (Signed)
I was present at this dialysis session. I have reviewed the session itself and made appropriate changes.   Vital signs in last 24 hours:  Temp:  [98.2 F (36.8 C)-98.8 F (37.1 C)] 98.2 F (36.8 C) (07/04 0930) Pulse Rate:  [95-118] 102 (07/04 1130) Resp:  [16-23] 20 (07/04 1130) BP: (159-219)/(81-109) 159/82 (07/04 1130) SpO2:  [96 %-100 %] 100 % (07/04 1130) Weight change:  There were no vitals filed for this visit.  Recent Labs  Lab 03/06/22 1730  NA 140  K 5.8*  CL 101  CO2 22  GLUCOSE 108*  BUN 59*  CREATININE 9.59*  CALCIUM 9.7    Recent Labs  Lab 03/05/22 2356 03/06/22 1730  WBC 4.7 7.4  HGB 9.4* 10.5*  HCT 29.1* 32.9*  MCV 92.7 94.5  PLT 129* 139*    Scheduled Meds:  amLODipine  10 mg Oral Daily   aspirin  81 mg Oral Daily   [START ON 03/08/2022] cinacalcet  90 mg Oral Once per day on Mon Wed Fri   cloNIDine  0.2 mg Transdermal Weekly   cycloSPORINE modified  200 mg Oral BID   cycloSPORINE modified  25 mg Oral Q1400   [START ON 03/08/2022] doxercalciferol  8 mcg Intravenous Q M,W,F-HD   DULoxetine  20 mg Oral Daily   ferric citrate  630 mg Oral TID WC   heparin  5,000 Units Subcutaneous Q8H   hydrALAZINE  75 mg Oral Q8H   mycophenolate  540 mg Oral BID   pantoprazole  40 mg Oral Daily   predniSONE  15 mg Oral Daily   sodium chloride flush  3 mL Intravenous Q12H   sodium zirconium cyclosilicate  10 g Oral Q T,Th,S,Su   Continuous Infusions: PRN Meds:.acetaminophen **OR** acetaminophen, labetalol, lidocaine (PF), lidocaine-prilocaine, LORazepam, metoCLOPramide, oxyCODONE, pentafluoroprop-tetrafluoroeth   Donetta Potts,  MD 03/07/2022, 11:40 AM

## 2022-03-07 NOTE — ED Notes (Signed)
Patient moaning loudly complaining of pain. Triage RN Joelene Millin notified. Patient taken to triage for re-evaluation

## 2022-03-07 NOTE — Progress Notes (Addendum)
Same day note  Patient seen and examined at bedside.  Patient was admitted to the hospital for chest pain abdominal pain back pain.  At the time of my evaluation, patient complains of chest pain back pain, no dyspnea diaphoresis.  Inquiring about oxycodone.  Feels anxious and requests Ativan.  Physical examination reveals tenderness over the chest wall which is reproducible, anxious thinly built female.  Laboratory data and imaging was reviewed  Assessment and Plan. Principal Problem:   ESRD needing dialysis [facility]) Active Problems:   Hypertensive urgency   Type 2 diabetes mellitus with other diabetic kidney complication (HCC)   Hyperkalemia   Diabetic gastroparesis associated with type 1 diabetes mellitus ([place])   Transplant rejection   Pulmonary edema   Hypervolemia associated with renal insufficiency   Chronic pain     ESRD; hypervolemia with pulmonary edema and acute hypoxic respiratory failure; hyperkalemia  Patient had not been dialyzed since 03/03/22, was noted to have pulmonary edema with hyperkalemia.  Was given Lokelma and supplemental oxygen and nephrology was consulted.  We will continue to monitor.  Chest x-ray with pleural effusion.  Continue hemodialysis as per nephrology.  We will continue to wean oxygen as able.     Chest pain/abdominal pain, chronic pain syndrome - Troponin slightly elevated but flat.  ECG nonischemic.Marland Kitchen  Patient does have history of renal transplant and end-stage renal disease.  Does have reproducible tenderness on palpation.  Chest pain is very atypical.  Likely secondary to hypervolemia, musculoskeletal issues and hypertension.  We will try to avoid escalation of narcotics if possible currently on oxycodone.  CT scan of the abdomen and pelvis showed no acute findings but bilateral pleural effusion.  Continue Protonix.   Hypertensive urgency  Still uncontrolled.  Continue Norvasc, clonidine patch, hydralazine, PRN labetalol .    We will also restart   Coreg from home.  On losartan 100 mg twice a day at home but due to hyperkalemia will not start today.   Hx of pancreatic and renal transplant  -History of transplant rejection.  Continue cyclosporine, mycophenolate, prednisone.  Nephrology on board.   Chronic pain; anxiety  Continue Ativan, Cymbalta.  Complains of increasing anxiety,-add Atarax.    No Charge  Signed,  Delila Pereyra, MD [name]

## 2022-03-07 NOTE — Progress Notes (Signed)
Patient voices feeling anxious asked for Lorazepam. Spoke to Pharmacy who will send down medication per [facility] as unit does not carry in Pyxsis. Relayed to patient. Patient acknowledges. Patient currently sitting in bed starting HD treatment and eating a sandwich and applesauce.

## 2022-03-07 NOTE — Progress Notes (Signed)
Received patient in bed, alert and oriented. Informed consent signed and in chart.  Time tx completed: 1307  HD treatment completed. Patient tolerated well. RUE Fistula without signs and symptoms of complications. Patient expressed feeling unwell and requested to stop treatment early. Remaining time for treatment: 17 minutes. Patient asked for snack after treatment. Patient denies pain at this time. Report given to bedside RN. Patient transported back to the room, alert and oriented and in no acute distress.   Total UF removed: 2.7L   Medication given: Lorazepam 0.'5mg'$  for anxiety   Post HD VS: 164/78  02 100 on 2L  (oxygen used only during HD treatment, per patient) Resp 20 Temp 98   Post HD weight: unable to obtain.

## 2022-03-07 NOTE — ED Provider Notes (Signed)
[facility] EMERGENCY DEPARTMENT Provider Note   CSN: [id number] Arrival date & time: 03/06/22  1704     History  Chief Complaint - abdominal pain   [name] [name] is a 41 y.o. female.  The history is provided by the patient.  Patient with extensive history including end-stage renal disease presents with chest and abdominal pain.  She reports for the past 24 hours she had increasing chest and abdominal pain.  She has had the symptoms previously She reports her last dialysis session was on June 30 She has increased shortness of breath.  She reports associated vomiting and diarrhea.  No fevers.  She reports increased lower extremity edema. Patient is not on oxygen chronically    Home Medications Prior to Admission medications   Medication Sig Start Date End Date Taking? Authorizing Provider  amLODipine (NORVASC) 10 MG tablet Take 1 tablet (10 mg total) by mouth daily. 02/05/22 05/06/22  Terrilee Croak, MD  aspirin 81 MG chewable tablet Chew 81 mg by mouth daily.    [provider]  AURYXIA 1 GM 210 MG(Fe) tablet Take 630 mg by mouth 3 (three) times daily. 02/18/22   [provider]  calcium acetate (PHOSLO) 667 MG capsule Take 4 capsules (2,668 mg total) by mouth 3 (three) times daily with meals. 02/05/22 05/06/22  Terrilee Croak, MD  Calcium Carbonate Antacid (CALCIUM CARBONATE, DOSED IN MG ELEMENTAL CALCIUM,) 1250 MG/5ML SUSP Take 5 mLs (500 mg of elemental calcium total) by mouth every 6 (six) hours as needed for indigestion. 02/05/22 05/06/22  Terrilee Croak, MD  carvedilol (COREG) 12.5 MG tablet Take 1 tablet (12.5 mg total) by mouth 2 (two) times daily with a meal. Patient not taking: Reported on 02/23/2022 02/05/22 05/06/22  Terrilee Croak, MD  cloNIDine (CATAPRES - DOSED IN MG/24 HR) 0.2 mg/24hr patch Place 1 patch (0.2 mg total) onto the skin once a week. 02/08/22 05/09/22  Terrilee Croak, MD  cycloSPORINE modified (NEORAL) 100 MG capsule Take 2 capsules (200 mg  total) by mouth 2 (two) times daily. 12/25/21   Pokhrel, Corrie Mckusick, MD  cycloSPORINE modified (NEORAL) 25 MG capsule Take 25 mg by mouth daily.    [provider]  Darbepoetin Alfa (ARANESP) 60 MCG/0.3ML SOSY injection Inject 0.3 mLs (60 mcg total) into the vein every Friday with hemodialysis. 11/18/21   Cherene Altes, MD  doxercalciferol (HECTOROL) 4 MCG/2ML injection Inject 3 mLs (6 mcg total) into the vein every Monday, Wednesday, and Friday with hemodialysis. 11/17/21   Cherene Altes, MD  DULoxetine (CYMBALTA) 20 MG capsule Take 1 capsule (20 mg total) by mouth daily. 02/06/22 05/07/22  Terrilee Croak, MD  hydrALAZINE (APRESOLINE) 25 MG tablet Take 3 tablets (75 mg total) by mouth every 8 (eight) hours. 02/05/22 05/06/22  Terrilee Croak, MD  lidocaine-prilocaine (EMLA) cream Apply 1 application topically daily as needed (port access). 07/09/20   [provider]  losartan (COZAAR) 100 MG tablet Take 1 tablet (100 mg total) by mouth daily. 12/04/21 03/04/22  [place] Donnamarie Poag, DO  metoCLOPramide (REGLAN) 5 MG tablet Take 5 mg by mouth every 6 (six) hours as needed for nausea or vomiting.    [provider]  mycophenolate (MYFORTIC) 180 MG EC tablet Take 540 mg by mouth 2 (two) times daily.    [provider]  ondansetron (ZOFRAN) 4 MG tablet Take 4 mg by mouth every 8 (eight) hours as needed for nausea or vomiting.    [provider]  pantoprazole ([place])  40 MG tablet Take 1 tablet (40 mg total) by mouth daily. 02/06/22 05/07/22  Terrilee Croak, MD  predniSONE (DELTASONE) 5 MG tablet Take 15 mg by mouth daily. 06/24/15   [provider]  sodium zirconium cyclosilicate (LOKELMA) 10 g PACK packet Take 10 g by mouth as directed. Take on Non-Dialysis (Sun,Tues, Thurs, Sat)    [provider]      Allergies    Patient has no known allergies.    Review of Systems   Review of Systems  Constitutional:  Negative for fever.  Gastrointestinal:  Positive  for abdominal pain.    Physical Exam Updated Vital Signs BP (!) 187/96   Pulse 100   Temp 98.3 F (36.8 C) (Oral)   Resp 18   SpO2 100%  Physical Exam CONSTITUTIONAL: Chronically ill-appearing, appears older than stated age, moaning in pain HEAD: Normocephalic/atraumatic EYES: EOMI/PERRL ENMT: Mucous membranes moist NECK: supple no meningeal signs SPINE/BACK:entire spine nontender CV: S1/S2 noted tachycardic LUNGS: Decreased breath sounds bilaterally, no acute distress, on 2 L oxygen ABDOMEN: soft, tenderness noted left lower quad GU:no cva tenderness NEURO: Pt is awake/alert/appropriate, moves all extremitiesx4.  No facial droop.   EXTREMITIES: pulses normal/equal, edema noted lower extremities SKIN: warm, color normal PSYCH: Anxious  ED Results / Procedures / Treatments   Labs (all labs ordered are listed, but only abnormal results are displayed) Labs Reviewed  CBC - Abnormal; Notable for the following components:      Result Value   RBC 3.48 (*)    Hemoglobin 10.5 (*)    HCT 32.9 (*)    Platelets 139 (*)    All other components within normal limits  COMPREHENSIVE METABOLIC PANEL - Abnormal; Notable for the following components:   Potassium 5.8 (*)    Glucose, Bld 108 (*)    BUN 59 (*)    Creatinine, Ser 9.59 (*)    AST 10 (*)    GFR, Estimated 5 (*)    Anion gap 17 (*)    All other components within normal limits  TROPONIN I (HIGH SENSITIVITY) - Abnormal; Notable for the following components:   Troponin I (High Sensitivity) 26 (*)    All other components within normal limits  TROPONIN I (HIGH SENSITIVITY) - Abnormal; Notable for the following components:   Troponin I (High Sensitivity) 30 (*)    All other components within normal limits  I-STAT BETA HCG BLOOD, ED ([facility], [facility], [facility] ONLY)    EKG EKG Interpretation  Date/Time:  Tuesday March 07 2022 04:04:40 EDT Ventricular Rate:  102 PR Interval:  138 QRS Duration: 74 QT Interval:  370 QTC Calculation: 482 R  Axis:   96 Text Interpretation: Sinus tachycardia Rightward axis Possible Anterior infarct , age undetermined Abnormal ECG Confirmed by Ripley Fraise [phone]) on 03/07/2022 4:09:40 AM  Radiology CT ABDOMEN PELVIS WO CONTRAST  Result Date: 03/06/2022 CLINICAL DATA:  Nonlocalized abdominal pain for 24 hours. EXAM: CT ABDOMEN AND PELVIS WITHOUT CONTRAST TECHNIQUE: Multidetector CT imaging of the abdomen and pelvis was performed following the standard protocol without IV contrast. RADIATION DOSE REDUCTION: This exam was performed according to the departmental dose-optimization program which includes automated exposure control, adjustment of the mA and/or kV according to patient size and/or use of iterative reconstruction technique. COMPARISON:  01/30/2022 FINDINGS: Lower chest: Small bilateral pleural effusions are associated with diffuse ground-glass opacity in both lung bases. Trace pericardial effusion evident. Hepatobiliary: No suspicious focal abnormality in the liver on this study without intravenous contrast. There  is no evidence for gallstones, gallbladder wall thickening, or pericholecystic fluid. No intrahepatic or extrahepatic biliary dilation. Pancreas: No focal mass lesion. No dilatation of the main duct. No intraparenchymal cyst. No peripancreatic edema. Spleen: Spleen size upper normal to mildly enlarged. Adrenals/Urinary Tract: No adrenal nodule or mass. Both kidneys are atrophic. No evidence for hydroureter. The urinary bladder appears normal for the degree of distention. Stomach/Bowel: Stomach is unremarkable. No gastric wall thickening. No evidence of outlet obstruction. Duodenum is normally positioned as is the ligament of Treitz. No small bowel wall thickening. No small bowel dilatation. Neither the terminal ileum nor the appendix are discretely visible on this study without oral or intravenous contrast material. No gross colonic mass. No colonic wall thickening. Vascular/Lymphatic: There is  advanced atherosclerotic calcification of the abdominal aorta without aneurysm. Marked wall calcification noted in dominant arterial anatomy of the abdomen and pelvis. There is no gastrohepatic or hepatoduodenal ligament lymphadenopathy. No retroperitoneal or mesenteric lymphadenopathy. No pelvic sidewall lymphadenopathy. Reproductive: Unremarkable. Other: No intraperitoneal free fluid. Musculoskeletal: No worrisome lytic or sclerotic osseous abnormality. IMPRESSION: 1. No acute findings in the abdomen or pelvis. Specifically, no findings to explain the patient's history of abdominal pain. 2. Small bilateral pleural effusions with diffuse ground-glass opacity in both lung bases. Imaging features can be seen in the setting of pulmonary edema. 3. Atrophic kidneys. 4. Aortic Atherosclerosis (ICD10-I70.0). Electronically Signed   By: Misty Stanley M.D.   On: 03/06/2022 20:39   DG Chest 2 View  Result Date: 03/06/2022 CLINICAL DATA:  Chest pain EXAM: CHEST - 2 VIEW COMPARISON:  03/05/2022 FINDINGS: Stable cardiomegaly. Pulmonary vascular congestion with mild diffuse interstitial prominence, which may be minimally improved from prior. No new focal consolidation. No pleural effusion or pneumothorax. IMPRESSION: Findings suggestive of CHF with mild edema, which may be minimally improved from prior. Electronically Signed   By: Davina Poke D.O.   On: 03/06/2022 17:48   DG Chest 2 View  Result Date: 03/06/2022 CLINICAL DATA:  Chest pain and nausea. EXAM: CHEST - 2 VIEW COMPARISON:  None Available. FINDINGS: There is stable mild to moderate severity enlargement of the cardiac silhouette. Stable moderate severity diffusely increased interstitial lung markings are seen. There is no evidence of focal consolidation, pleural effusion or pneumothorax. The visualized skeletal structures are unremarkable. IMPRESSION: Stable cardiomegaly with moderate severity interstitial lung disease. Electronically Signed   By: Virgina Norfolk M.D.   On: 03/06/2022 00:35    Procedures Procedures    Medications Ordered in ED Medications  sodium zirconium cyclosilicate (LOKELMA) packet 10 g (has no administration in time range)  LORazepam (ATIVAN) injection 1 mg (has no administration in time range)  acetaminophen (TYLENOL) tablet 650 mg (has no administration in time range)    ED Course/ Medical Decision Making/ A&P Clinical Course as of 03/07/22 0439  Tue Mar 07, 2022  0405 Potassium(!): 5.8 Hyperkalemia [DW]  0405 Hemoglobin(!): 10.5 Anemia noted [DW]  0439 Discussed with Dr. Myna Hidalgo for admission [DW]  [phone] There appears to be a chronic pain component, CT imaging was negative.  Advised patient would not be prescribing narcotics at this time [DW]    Clinical Course User Index [DW] Ripley Fraise, MD                           Medical Decision Making Amount and/or Complexity of Data Reviewed Labs:  Decision-making details documented in ED Course.  Risk OTC drugs. Prescription drug management. Decision  regarding hospitalization.   This patient presents to the ED for concern of abdominal pain and chest pain, this involves an extensive number of treatment options, and is a complaint that carries with it a high risk of complications and morbidity.  The differential diagnosis includes but is not limited to cholecystitis, cholelithiasis, pancreatitis, gastritis, peptic ulcer disease, appendicitis, bowel obstruction, bowel perforation, diverticulitis, AAA, ischemic bowel    Comorbidities that complicate the patient evaluation: Patient's presentation is complicated by their history of end-stage renal disease  Social Determinants of Health: Patient's  nonadherence to medical treatment   increases the complexity of managing their presentation  Additional history obtained: Records reviewed previous admission documents  Lab Tests: I Ordered, and personally interpreted labs.  The pertinent results include:  Hyperkalemia, renal failure, anemia  Imaging Studies ordered: I ordered imaging studies including CT scan abdomen pelvis and X-ray chest   I independently visualized and interpreted imaging which showed chest x-ray reveals pulmonary edema I agree with the radiologist interpretation  Cardiac Monitoring: The patient was maintained on a cardiac monitor.  I personally viewed and interpreted the cardiac monitor which showed an underlying rhythm of:  sinus rhythm  Medicines ordered and prescription drug management: I ordered medication including Lokelma for hyperkalemia Ativan for anxiety  Critical Interventions:  Admission for dialysis  Consultations Obtained: I requested consultation with the consultant Dr. Meredeth Ide , and discussed  findings as well as pertinent plan - they recommend: Admit to hospitalist, will dialyze, recommends [facility]  Reevaluation: After the interventions noted above, I reevaluated the patient and found that they have :stayed the same  Complexity of problems addressed: Patient's presentation is most consistent with  acute presentation with potential threat to life or bodily function  Disposition: After consideration of the diagnostic results and the patient's response to treatment,  I feel that the patent would benefit from admission   .           Final Clinical Impression(s) / ED Diagnoses Final diagnoses:  ESRD (end stage renal disease) ([place])  Hyperkalemia  Acute pulmonary edema ([place])    Rx / DC Orders ED Discharge Orders     None         Ripley Fraise, MD 03/07/22 [phone]

## 2022-03-07 NOTE — Progress Notes (Signed)
  Transition of Care [facility]) Screening Note   Patient Details  Name: [name] [name] Date of Birth: 05/03/1981   Transition of Care [facility]) CM/SW Contact:    Cyndi Bender, RN Phone Number: 03/07/2022, 2:41 PM    Transition of Care Department [facility]) has reviewed patient and no TOC needs have been identified at this time. We will continue to monitor patient advancement through interdisciplinary progression rounds. If new patient transition needs arise, please place a TOC consult.

## 2022-03-07 NOTE — H&P (Signed)
History and Physical    [name] [name] QQP:[id number] DOB: Dec 30, 1980 DOA: 03/06/2022  PCP: [facility]   Patient coming from: Home   Chief Complaint: Chest pain, abdominal pain, back pain  HPI: [name] [name] is a pleasant 41 y.o. 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  Patient was seen in the emergency department the evening of 03/05/2022 with chest pain and abdominal pain, was treated, and discharged the following morning but missed outpatient dialysis and now returns with recurrent chest and abdominal pain.  She reports completing hemodialysis on 03/03/2022.  Chest pain is severe, constant, and localized to the central chest.  Abdominal pain is mainly in the upper abdomen and similar to chronic abdominal pain that she suffers from.  She saw a new pain management physician on 03/03/2022 and was offered nerve block.  She reports worsening in her chronic nausea.  Denies fevers or chills.  ED Course: Upon arrival to the ED, patient is found to be afebrile and saturating well on 2 L/min of supplemental oxygen with elevated blood pressures.  EKG was sinus rhythm and nonspecific ST-T abnormalities.  Troponin is slightly elevated.  Chest x-ray concerning for mild edema.  CT of the abdomen pelvis is notable for small bilateral pleural effusions and pulmonary edema but no acute intra-abdominal or pelvic abnormality.  Chemistry panel features a potassium of 5.8, normal bicarbonate, and BUN 59.  CBC was stable chronic anemia and mild thrombocytopenia.  Patient was treated with Lokelma and Ativan in the ED.  Nephrology was consulted by the ED physician.  Review of Systems:  All other systems reviewed and apart from HPI, are negative.  Past Medical History:  Diagnosis Date   Anemia     Anxiety    ESRD (end stage renal disease) ([place])    Henmodialysis   Gastroparesis    Hypertension    Narcotic abuse ([place])    Non compliance with medical treatment    Renal disorder 2015   transplant   Vision loss     Past Surgical History:  Procedure Laterality Date   A/V FISTULAGRAM Right 02/17/2021   Procedure: A/V FISTULAGRAM;  Surgeon: [name], MD;  Location: [facility];  Service: Cardiovascular;  Laterality: Right;   AV FISTULA PLACEMENT  11/17/2011   Procedure: ARTERIOVENOUS (AV) FISTULA CREATION;  Surgeon: Rosetta Posner, MD;  Location: [facility];  Service: Vascular;  Laterality: Right;   BIOPSY  12/03/2021   Procedure: BIOPSY;  Surgeon: Doran Stabler, MD;  Location: [facility];  Service: Gastroenterology;;   ESOPHAGOGASTRODUODENOSCOPY (EGD) WITH PROPOFOL N/A 12/03/2021   Procedure: ESOPHAGOGASTRODUODENOSCOPY (EGD) WITH PROPOFOL;  Surgeon: Doran Stabler, MD;  Location: [facility];  Service: Gastroenterology;  Laterality: N/A;   EYE SURGERY     HEMATOMA EVACUATION Right 02/25/2021   Procedure: EVACUATION HEMATOMA RIGHT CHEST;  Surgeon: Waynetta Sandy, MD;  Location: [facility];  Service: Vascular;  Laterality: Right;   INSERTION OF DIALYSIS CATHETER Right 12/08/2020   Procedure: INSERTION OF TUNNELED DIALYSIS CATHETER;  Surgeon: [name], MD;  [facility]: [facility];  Service: Vascular;  Laterality: Right;   IR REMOVAL TUN CV CATH W/O FL  05/03/2021   KIDNEY TRANSPLANT     NEPHRECTOMY TRANSPLANTED ORGAN     pancrease transplant     PERIPHERAL VASCULAR BALLOON ANGIOPLASTY  Right 02/17/2021   Procedure: PERIPHERAL VASCULAR BALLOON ANGIOPLASTY;  Surgeon: [name], MD;  Location: [facility];  Service: Cardiovascular;  Laterality: Right;   REFRACTIVE SURGERY     REVISON OF ARTERIOVENOUS FISTULA Right 12/08/2020   Procedure: RIGHT ARM ARTERIOVENOUS FISTULA REVISION AND RESECTION;  Surgeon: [name], MD;  Location: [facility];   Service: Vascular;  Laterality: Right;   REVISON OF ARTERIOVENOUS FISTULA Right 02/25/2021   Procedure: RIGHT ARM ARTERIOVENOUS FISTULA REVISION WITH TRANSPOSITION OF CEPHALIC VEIN ON AXILLARY VEIN;  Surgeon: [name], MD;  Location: [facility];  Service: Vascular;  Laterality: Right;    Social History:   reports that she has quit smoking. Her smoking use included pipe. She has never used smokeless tobacco. She reports that she does not drink alcohol and does not use drugs.  No Known Allergies  Family History  Problem Relation Age of Onset   Hypertension Father      Prior to Admission medications   Medication Sig Start Date End Date Taking? Authorizing Provider  amLODipine (NORVASC) 10 MG tablet Take 1 tablet (10 mg total) by mouth daily. 02/05/22 05/06/22  Terrilee Croak, MD  aspirin 81 MG chewable tablet Chew 81 mg by mouth daily.    [provider]  AURYXIA 1 GM 210 MG(Fe) tablet Take 630 mg by mouth 3 (three) times daily. 02/18/22   [provider]  calcium acetate (PHOSLO) 667 MG capsule Take 4 capsules (2,668 mg total) by mouth 3 (three) times daily with meals. 02/05/22 05/06/22  Terrilee Croak, MD  Calcium Carbonate Antacid (CALCIUM CARBONATE, DOSED IN MG ELEMENTAL CALCIUM,) 1250 MG/5ML SUSP Take 5 mLs (500 mg of elemental calcium total) by mouth every 6 (six) hours as needed for indigestion. 02/05/22 05/06/22  Terrilee Croak, MD  carvedilol (COREG) 12.5 MG tablet Take 1 tablet (12.5 mg total) by mouth 2 (two) times daily with a meal. Patient not taking: Reported on 02/23/2022 02/05/22 05/06/22  Terrilee Croak, MD  cloNIDine (CATAPRES - DOSED IN MG/24 HR) 0.2 mg/24hr patch Place 1 patch (0.2 mg total) onto the skin once a week. 02/08/22 05/09/22  Terrilee Croak, MD  cycloSPORINE modified (NEORAL) 100 MG capsule Take 2 capsules (200 mg total) by mouth 2 (two) times daily. 12/25/21   Pokhrel, Corrie Mckusick, MD  cycloSPORINE modified (NEORAL) 25 MG capsule Take 25 mg by mouth daily.    [provider]  Darbepoetin Alfa (ARANESP) 60 MCG/0.3ML SOSY injection Inject 0.3 mLs (60 mcg total) into the vein every Friday with hemodialysis. 11/18/21   Cherene Altes, MD  doxercalciferol (HECTOROL) 4 MCG/2ML injection Inject 3 mLs (6 mcg total) into the vein every Monday, Wednesday, and Friday with hemodialysis. 11/17/21   Cherene Altes, MD  DULoxetine (CYMBALTA) 20 MG capsule Take 1 capsule (20 mg total) by mouth daily. 02/06/22 05/07/22  Terrilee Croak, MD  hydrALAZINE (APRESOLINE) 25 MG tablet Take 3 tablets (75 mg total) by mouth every 8 (eight) hours. 02/05/22 05/06/22  Terrilee Croak, MD  lidocaine-prilocaine (EMLA) cream Apply 1 application topically daily as needed (port access). 07/09/20   [provider]  losartan (COZAAR) 100 MG tablet Take 1 tablet (100 mg total) by mouth daily. 12/04/21 03/04/22  [place] Donnamarie Poag, DO  metoCLOPramide (REGLAN) 5 MG tablet Take 5 mg by mouth every 6 (six) hours as needed for nausea or vomiting.    [provider]  mycophenolate (MYFORTIC) 180 MG EC tablet Take 540 mg by mouth 2 (two)  times daily.    [provider]  ondansetron (ZOFRAN) 4 MG tablet Take 4 mg by mouth every 8 (eight) hours as needed for nausea or vomiting.    [provider]  pantoprazole (PROTONIX) 40 MG tablet Take 1 tablet (40 mg total) by mouth daily. 02/06/22 05/07/22  Terrilee Croak, MD  predniSONE (DELTASONE) 5 MG tablet Take 15 mg by mouth daily. 06/24/15   [provider]  sodium zirconium cyclosilicate (LOKELMA) 10 g PACK packet Take 10 g by mouth as directed. Take on Non-Dialysis (Sun,Tues, Thurs, Sat)    [provider]    Physical Exam: Vitals:   03/06/22 2001 03/06/22 2313 03/07/22 0247 03/07/22 0340  BP: (!) 199/88 (!) 184/85 (!) 197/103 (!) 187/96  Pulse: (!) 110 99 98 100  Resp: 17 (!) '22 18 18  '$ Temp: 98.3 F (36.8 C)     TempSrc: Oral     SpO2: 98% 100% 100% 100%    Constitutional: NAD, no diaphoresis or pallor    Eyes: PERTLA, lids and conjunctivae normal ENMT: Mucous membranes are moist. Posterior pharynx clear of any exudate or lesions.   Neck: supple, no masses  Respiratory: Mild tachypnea, fine rales bilaterally, no wheezing.   Cardiovascular: S1 & S2 heard, regular rate and rhythm. Minimal lower extremity edema.  Abdomen: No distension, no tenderness, soft. Bowel sounds active.  Musculoskeletal: no clubbing / cyanosis. No joint deformity upper and lower extremities.   Skin: no significant rashes, lesions, ulcers. Warm, dry, well-perfused. Neurologic: CN 2-12 grossly intact. Moving all extremities. Alert and oriented.  Psychiatric: Pleasant. Cooperative.    Labs and Imaging on Admission: I have personally reviewed following labs and imaging studies  CBC: Recent Labs  Lab 03/05/22 2356 03/06/22 1730  WBC 4.7 7.4  HGB 9.4* 10.5*  HCT 29.1* 32.9*  MCV 92.7 94.5  PLT 129* 811*   Basic Metabolic Panel: Recent Labs  Lab 03/05/22 2356 03/06/22 1730  NA 139 140  K 5.0 5.8*  CL 101 101  CO2 22 22  GLUCOSE 108* 108*  BUN 49* 59*  CREATININE 8.09* 9.59*  CALCIUM 9.7 9.7   GFR: Estimated Creatinine Clearance: 6.2 mL/min (A) (by C-G formula based on SCr of 9.59 mg/dL (H)). Liver Function Tests: Recent Labs  Lab 03/06/22 1730  AST 10*  ALT 10  ALKPHOS 58  BILITOT 1.2  PROT 7.1  ALBUMIN 3.7   No results for input(s): "LIPASE", "AMYLASE" in the last 168 hours. No results for input(s): "AMMONIA" in the last 168 hours. Coagulation Profile: No results for input(s): "INR", "PROTIME" in the last 168 hours. Cardiac Enzymes: No results for input(s): "CKTOTAL", "CKMB", "CKMBINDEX", "TROPONINI" in the last 168 hours. BNP (last 3 results) No results for input(s): "PROBNP" in the last 8760 hours. HbA1C: No results for input(s): "HGBA1C" in the last 72 hours. CBG: No results for input(s): "GLUCAP" in the last 168 hours. Lipid Profile: No results for input(s): "CHOL", "HDL",  "LDLCALC", "TRIG", "CHOLHDL", "LDLDIRECT" in the last 72 hours. Thyroid Function Tests: No results for input(s): "TSH", "T4TOTAL", "FREET4", "T3FREE", "THYROIDAB" in the last 72 hours. Anemia Panel: No results for input(s): "VITAMINB12", "FOLATE", "FERRITIN", "TIBC", "IRON", "RETICCTPCT" in the last 72 hours. Urine analysis:    Component Value Date/Time   COLORURINE RED (A) 11/07/2021 1239   APPEARANCEUR TURBID (A) 11/07/2021 1239   LABSPEC  11/07/2021 1239    TEST NOT REPORTED DUE TO COLOR INTERFERENCE OF URINE PIGMENT   PHURINE  11/07/2021 1239  TEST NOT REPORTED DUE TO COLOR INTERFERENCE OF URINE PIGMENT   GLUCOSEU (A) 11/07/2021 1239    TEST NOT REPORTED DUE TO COLOR INTERFERENCE OF URINE PIGMENT   HGBUR (A) 11/07/2021 1239    TEST NOT REPORTED DUE TO COLOR INTERFERENCE OF URINE PIGMENT   HGBUR large 12/03/2007 1409   BILIRUBINUR (A) 11/07/2021 1239    TEST NOT REPORTED DUE TO COLOR INTERFERENCE OF URINE PIGMENT   KETONESUR (A) 11/07/2021 1239    TEST NOT REPORTED DUE TO COLOR INTERFERENCE OF URINE PIGMENT   PROTEINUR (A) 11/07/2021 1239    TEST NOT REPORTED DUE TO COLOR INTERFERENCE OF URINE PIGMENT   UROBILINOGEN 0.2 01/13/2015 0124   NITRITE (A) 11/07/2021 1239    TEST NOT REPORTED DUE TO COLOR INTERFERENCE OF URINE PIGMENT   LEUKOCYTESUR (A) 11/07/2021 1239    TEST NOT REPORTED DUE TO COLOR INTERFERENCE OF URINE PIGMENT   Sepsis Labs: '@LABRCNTIP'$ (procalcitonin:4,lacticidven:4) )No results found for this or any previous visit (from the past 240 hour(s)).   Radiological Exams on Admission: CT ABDOMEN PELVIS WO CONTRAST  Result Date: 03/06/2022 CLINICAL DATA:  Nonlocalized abdominal pain for 24 hours. EXAM: CT ABDOMEN AND PELVIS WITHOUT CONTRAST TECHNIQUE: Multidetector CT imaging of the abdomen and pelvis was performed following the standard protocol without IV contrast. RADIATION DOSE REDUCTION: This exam was performed according to the departmental dose-optimization  program which includes automated exposure control, adjustment of the mA and/or kV according to patient size and/or use of iterative reconstruction technique. COMPARISON:  01/30/2022 FINDINGS: Lower chest: Small bilateral pleural effusions are associated with diffuse ground-glass opacity in both lung bases. Trace pericardial effusion evident. Hepatobiliary: No suspicious focal abnormality in the liver on this study without intravenous contrast. There is no evidence for gallstones, gallbladder wall thickening, or pericholecystic fluid. No intrahepatic or extrahepatic biliary dilation. Pancreas: No focal mass lesion. No dilatation of the main duct. No intraparenchymal cyst. No peripancreatic edema. Spleen: Spleen size upper normal to mildly enlarged. Adrenals/Urinary Tract: No adrenal nodule or mass. Both kidneys are atrophic. No evidence for hydroureter. The urinary bladder appears normal for the degree of distention. Stomach/Bowel: Stomach is unremarkable. No gastric wall thickening. No evidence of outlet obstruction. Duodenum is normally positioned as is the ligament of Treitz. No small bowel wall thickening. No small bowel dilatation. Neither the terminal ileum nor the appendix are discretely visible on this study without oral or intravenous contrast material. No gross colonic mass. No colonic wall thickening. Vascular/Lymphatic: There is advanced atherosclerotic calcification of the abdominal aorta without aneurysm. Marked wall calcification noted in dominant arterial anatomy of the abdomen and pelvis. There is no gastrohepatic or hepatoduodenal ligament lymphadenopathy. No retroperitoneal or mesenteric lymphadenopathy. No pelvic sidewall lymphadenopathy. Reproductive: Unremarkable. Other: No intraperitoneal free fluid. Musculoskeletal: No worrisome lytic or sclerotic osseous abnormality. IMPRESSION: 1. No acute findings in the abdomen or pelvis. Specifically, no findings to explain the patient's history of  abdominal pain. 2. Small bilateral pleural effusions with diffuse ground-glass opacity in both lung bases. Imaging features can be seen in the setting of pulmonary edema. 3. Atrophic kidneys. 4. Aortic Atherosclerosis (ICD10-I70.0). Electronically Signed   By: Misty Stanley M.D.   On: 03/06/2022 20:39   DG Chest 2 View  Result Date: 03/06/2022 CLINICAL DATA:  Chest pain EXAM: CHEST - 2 VIEW COMPARISON:  03/05/2022 FINDINGS: Stable cardiomegaly. Pulmonary vascular congestion with mild diffuse interstitial prominence, which may be minimally improved from prior. No new focal consolidation. No pleural effusion or pneumothorax. IMPRESSION: Findings suggestive  of CHF with mild edema, which may be minimally improved from prior. Electronically Signed   By: Davina Poke D.O.   On: 03/06/2022 17:48   DG Chest 2 View  Result Date: 03/06/2022 CLINICAL DATA:  Chest pain and nausea. EXAM: CHEST - 2 VIEW COMPARISON:  None Available. FINDINGS: There is stable mild to moderate severity enlargement of the cardiac silhouette. Stable moderate severity diffusely increased interstitial lung markings are seen. There is no evidence of focal consolidation, pleural effusion or pneumothorax. The visualized skeletal structures are unremarkable. IMPRESSION: Stable cardiomegaly with moderate severity interstitial lung disease. Electronically Signed   By: Virgina Norfolk M.D.   On: 03/06/2022 00:35    EKG: Independently reviewed. Sinus rhythm, anterolateral ST-T abnormality.   Assessment/Plan   1. ESRD; hypervolemia with pulmonary edema and acute hypoxic respiratory failure; hyperkalemia  - Presents with chest pain, has not been dialyzed since 03/03/22, and found to have pulmonary edema with new supplemental O2 requirement and potassium 5.8   - She was treated with Lokelma and supplemental O2 in ED  - Nephrology consulted by ED MD  - Restrict fluids, renally-dose medications, repeat chem panel, continue phosphate binder     2. Chest pain  - Troponin slightly elevated  - Low suspicion for ACS, likely related hypervolemia and HTN   3. Hypertensive urgency  - Anticipate improvement with dialysis  - Continue Norvasc, clonidine patch, and hydralazine, use labetalol as needed    4. Hx of pancreatic and renal transplant  - Continue cyclosporine, mycophenolate, prednisone   5. Chronic pain; anxiety  - Prescription database reviewed  - Continue home medications    DVT prophylaxis: sq heparin  Code Status: Full  Level of Care: Level of care: Progressive Family Communication: None present  Disposition Plan:  Patient is from: home  Anticipated d/c is to: Home  Anticipated d/c date is: 03/09/22  Patient currently: Pending improved respiratory status with dialysis   Consults called: Nephrology  Admission status: Inpatient     Vianne Bulls, MD [name]  03/07/2022, 5:14 AM

## 2022-03-07 NOTE — Consult Note (Signed)
[facility] Renal Consultation Note    Indication for Consultation:  Management of ESRD/hemodialysis, anemia, hypertension/volume, and secondary hyperparathyroidism. PCP:  HPI: [name] [name] is a 41 y.o. female with ESRD, Hx dual pancreas/kidney transplant (kidney failed, pancreas still functional), HTN, Hx substance abuse and anxiety who was admitted with chest/abdominal pain.  Presented to ED on 7/2 with chest & abdominal pain. Labs and EKG stable range without acute findings, BP high -> treated with dilaudid, labetalol, haloperidol. Discharged home early morning hours of 03/06/22. Back again later that evening with similar complaints - HTN, chest and abdominal pain. No fever/chills. Labs at that time significant for K 5.8, CXR with mild pulm edema. Abd CT without acute findings aside from small B pleural effusions. She was given [facility] and admitted for dialysis. She had missed HD on 7/3 but otherwise had attended her recent sessions.  Seen at onset of dialysis this AM - was breathing heavily/hyperventilating and asking for Ativan. Calmed her down, provided sandwich, and gave her a dose of PO Ativan. Able to calm down and initiate dialysis treatment.   Past Medical History:  Diagnosis Date   Anemia    Anxiety    ESRD (end stage renal disease) ([place])    Henmodialysis   Gastroparesis    Hypertension    Narcotic abuse ([place])    Non compliance with medical treatment    Renal disorder 2015   transplant   Vision loss    Past Surgical History:  Procedure Laterality Date   A/V FISTULAGRAM Right 02/17/2021   Procedure: A/V FISTULAGRAM;  Surgeon: [name], MD;  Location: [facility];  Service: Cardiovascular;  Laterality: Right;   AV FISTULA PLACEMENT  11/17/2011   Procedure: ARTERIOVENOUS (AV) FISTULA CREATION;  Surgeon: Rosetta Posner, MD;  Location: [place];  Service: Vascular;  Laterality: Right;   BIOPSY  12/03/2021   Procedure: BIOPSY;  Surgeon: Doran Stabler, MD;  Location: [place];  Service: Gastroenterology;;   ESOPHAGOGASTRODUODENOSCOPY (EGD) WITH PROPOFOL N/A 12/03/2021   Procedure: ESOPHAGOGASTRODUODENOSCOPY (EGD) WITH PROPOFOL;  Surgeon: Doran Stabler, MD;  Location: [place];  Service: Gastroenterology;  Laterality: N/A;   EYE SURGERY     HEMATOMA EVACUATION Right 02/25/2021   Procedure: EVACUATION HEMATOMA RIGHT CHEST;  Surgeon: Waynetta Sandy, MD;  Location: [place];  Service: Vascular;  Laterality: Right;   INSERTION OF DIALYSIS CATHETER Right 12/08/2020   Procedure: INSERTION OF TUNNELED DIALYSIS CATHETER;  Surgeon: [name], MD;  Location: [facility] OR;  Service: Vascular;  Laterality: Right;   IR REMOVAL TUN CV CATH W/O FL  05/03/2021   KIDNEY TRANSPLANT     NEPHRECTOMY TRANSPLANTED ORGAN     pancrease transplant     PERIPHERAL VASCULAR BALLOON ANGIOPLASTY Right 02/17/2021   Procedure: PERIPHERAL VASCULAR BALLOON ANGIOPLASTY;  Surgeon: [name], MD;  Location: [place] [facility];  Service: Cardiovascular;  Laterality: Right;   REFRACTIVE SURGERY     REVISON OF ARTERIOVENOUS FISTULA Right 12/08/2020   Procedure: RIGHT ARM ARTERIOVENOUS FISTULA REVISION AND RESECTION;  Surgeon: [name], MD;  Location: [place];  Service: Vascular;  Laterality: Right;   REVISON OF ARTERIOVENOUS FISTULA Right 02/25/2021   Procedure: RIGHT ARM ARTERIOVENOUS FISTULA REVISION WITH TRANSPOSITION OF CEPHALIC VEIN ON AXILLARY VEIN;  Surgeon: [name], MD;  Location: [facility];  Service: Vascular;  Laterality: Right;   Family History  Problem Relation Age of Onset   Hypertension Father  Social History:  reports that she has quit smoking. Her smoking use included pipe. She has never used smokeless tobacco. She reports that she does not drink alcohol and does not use drugs.  ROS: As per HPI otherwise negative.  Physical Exam: Vitals:   03/07/22 0930 03/07/22 0949 03/07/22 1000 03/07/22 1030  BP: (!)  186/94 (!) 185/88 (!) 184/88 (!) 182/81  Pulse: (!) 106 (!) 102 99 95  Resp: '19 19 16 18  '$ Temp: 98.2 F (36.8 C)     TempSrc: Oral     SpO2: 98% 96% 100% 100%     General: Well developed, well nourished, in no acute distress (after ativan). Nasal O2 in place. Head: Normocephalic, atraumatic, sclera non-icteric, mucus membranes are moist. Neck: Supple without lymphadenopathy/masses. JVD not elevated. Lungs: Clear bilaterally to auscultation without wheezes, rales, or rhonchi.  Heart: RRR; 2/6 murmur Abdomen: Soft, mild L sided abdominal tenderness without rebound/guarding. Musculoskeletal:  Strength and tone appear normal for age. Lower extremities: No edema or ischemic changes, no open wounds. Neuro: Alert and oriented X 3. Moves all extremities spontaneously. Psych:  Responds to questions appropriately with a normal affect. Dialysis Access: RUE AVF + bruit  No Known Allergies Prior to Admission medications   Medication Sig Start Date End Date Taking? Authorizing Provider  amLODipine (NORVASC) 10 MG tablet Take 1 tablet (10 mg total) by mouth daily. 02/05/22 05/06/22  Terrilee Croak, MD  aspirin 81 MG chewable tablet Chew 81 mg by mouth daily.    [provider]  AURYXIA 1 GM 210 MG(Fe) tablet Take 630 mg by mouth 3 (three) times daily. 02/18/22   [provider]  calcium acetate (PHOSLO) 667 MG capsule Take 4 capsules (2,668 mg total) by mouth 3 (three) times daily with meals. 02/05/22 05/06/22  Terrilee Croak, MD  Calcium Carbonate Antacid (CALCIUM CARBONATE, DOSED IN MG ELEMENTAL CALCIUM,) 1250 MG/5ML SUSP Take 5 mLs (500 mg of elemental calcium total) by mouth every 6 (six) hours as needed for indigestion. 02/05/22 05/06/22  Terrilee Croak, MD  carvedilol (COREG) 12.5 MG tablet Take 1 tablet (12.5 mg total) by mouth 2 (two) times daily with a meal. Patient not taking: Reported on 02/23/2022 02/05/22 05/06/22  Terrilee Croak, MD  cloNIDine (CATAPRES - DOSED IN MG/24 HR) 0.2 mg/24hr patch  Place 1 patch (0.2 mg total) onto the skin once a week. 02/08/22 05/09/22  Terrilee Croak, MD  cycloSPORINE modified (NEORAL) 100 MG capsule Take 2 capsules (200 mg total) by mouth 2 (two) times daily. 12/25/21   Pokhrel, Corrie Mckusick, MD  cycloSPORINE modified (NEORAL) 25 MG capsule Take 25 mg by mouth daily.    [provider]  Darbepoetin Alfa (ARANESP) 60 MCG/0.3ML SOSY injection Inject 0.3 mLs (60 mcg total) into the vein every Friday with hemodialysis. 11/18/21   Cherene Altes, MD  doxercalciferol (HECTOROL) 4 MCG/2ML injection Inject 3 mLs (6 mcg total) into the vein every Monday, Wednesday, and Friday with hemodialysis. 11/17/21   Cherene Altes, MD  DULoxetine (CYMBALTA) 20 MG capsule Take 1 capsule (20 mg total) by mouth daily. 02/06/22 05/07/22  Terrilee Croak, MD  hydrALAZINE (APRESOLINE) 25 MG tablet Take 3 tablets (75 mg total) by mouth every 8 (eight) hours. 02/05/22 05/06/22  Terrilee Croak, MD  lidocaine-prilocaine (EMLA) cream Apply 1 application topically daily as needed (port access). 07/09/20   [provider]  losartan (COZAAR) 100 MG tablet Take 1 tablet (100 mg total) by mouth daily. 12/04/21 03/04/22  [place] Eric J, DO  metoCLOPramide (REGLAN) 5 MG tablet Take 5 mg by mouth every 6 (six) hours as needed for nausea or vomiting.    [provider]  mycophenolate (MYFORTIC) 180 MG EC tablet Take 540 mg by mouth 2 (two) times daily.    [provider]  ondansetron (ZOFRAN) 4 MG tablet Take 4 mg by mouth every 8 (eight) hours as needed for nausea or vomiting.    [provider]  pantoprazole (PROTONIX) 40 MG tablet Take 1 tablet (40 mg total) by mouth daily. 02/06/22 05/07/22  Terrilee Croak, MD  predniSONE (DELTASONE) 5 MG tablet Take 15 mg by mouth daily. 06/24/15   [provider]  sodium zirconium cyclosilicate (LOKELMA) 10 g PACK packet Take 10 g by mouth as directed. Take on Non-Dialysis (Sun,Tues, Thurs, Sat)    [provider]    Current Facility-Administered Medications  Medication Dose Route Frequency Provider Last Rate Last Admin   acetaminophen (TYLENOL) tablet 650 mg  650 mg Oral Q6H PRN Opyd, Ilene Qua, MD       Or   acetaminophen (TYLENOL) suppository 650 mg  650 mg Rectal Q6H PRN Opyd, Ilene Qua, MD       amLODipine (NORVASC) tablet 10 mg  10 mg Oral Daily Opyd, Ilene Qua, MD       aspirin chewable tablet 81 mg  81 mg Oral Daily Opyd, Ilene Qua, MD       calcium acetate (PHOSLO) capsule 2,668 mg  2,668 mg Oral TID WC Opyd, Ilene Qua, MD       calcium carbonate (dosed in mg elemental calcium) suspension 500 mg of elemental calcium  500 mg of elemental calcium Oral Q6H PRN Opyd, Ilene Qua, MD       cloNIDine (CATAPRES - Dosed in mg/24 hr) patch 0.2 mg  0.2 mg Transdermal Weekly Opyd, Ilene Qua, MD       cycloSPORINE modified (NEORAL) capsule 200 mg  200 mg Oral BID Opyd, Ilene Qua, MD       cycloSPORINE modified (NEORAL) capsule 25 mg  25 mg Oral Q1400 Opyd, Ilene Qua, MD       DULoxetine (CYMBALTA) DR capsule 20 mg  20 mg Oral Daily Opyd, Ilene Qua, MD       heparin injection 5,000 Units  5,000 Units Subcutaneous Q8H Opyd, Ilene Qua, MD   5,000 Units at 03/07/22 0640   hydrALAZINE (APRESOLINE) tablet 75 mg  75 mg Oral Q8H Opyd, Ilene Qua, MD   75 mg at 03/07/22 0640   labetalol (NORMODYNE) injection 10 mg  10 mg Intravenous Q2H PRN Opyd, Ilene Qua, MD       lidocaine (PF) (XYLOCAINE) 1 % injection 5 mL  5 mL Intradermal PRN Loren Racer, PA-C       lidocaine-prilocaine (EMLA) cream 1 Application  1 Application Topical PRN Loren Racer, PA-C       LORazepam (ATIVAN) tablet 0.5 mg  0.5 mg Oral BID PRN Opyd, Ilene Qua, MD   0.5 mg at 03/07/22 1021   metoCLOPramide (REGLAN) tablet 5 mg  5 mg Oral Q6H PRN Opyd, Ilene Qua, MD       mycophenolate (MYFORTIC) EC tablet 540 mg  540 mg Oral BID Opyd, Ilene Qua, MD       oxyCODONE (Oxy IR/ROXICODONE) immediate release tablet 5 mg  5 mg Oral Q12H PRN Opyd,  Ilene Qua, MD       pantoprazole (PROTONIX) EC tablet 40 mg  40 mg Oral Daily Opyd, Christia Reading  S, MD       pentafluoroprop-tetrafluoroeth (GEBAUERS) aerosol 1 Application  1 Application Topical PRN Loren Racer, PA-C       predniSONE (DELTASONE) tablet 15 mg  15 mg Oral Daily Opyd, Ilene Qua, MD       sodium chloride flush (NS) 0.9 % injection 3 mL  3 mL Intravenous Q12H Opyd, Ilene Qua, MD       sodium zirconium cyclosilicate (LOKELMA) packet 10 g  10 g Oral Q T,Th,S,Su Opyd, Ilene Qua, MD       Current Outpatient Medications  Medication Sig Dispense Refill   amLODipine (NORVASC) 10 MG tablet Take 1 tablet (10 mg total) by mouth daily. 30 tablet 2   aspirin 81 MG chewable tablet Chew 81 mg by mouth daily.     AURYXIA 1 GM 210 MG(Fe) tablet Take 630 mg by mouth 3 (three) times daily.     calcium acetate (PHOSLO) 667 MG capsule Take 4 capsules (2,668 mg total) by mouth 3 (three) times daily with meals. 360 capsule 2   Calcium Carbonate Antacid (CALCIUM CARBONATE, DOSED IN MG ELEMENTAL CALCIUM,) 1250 MG/5ML SUSP Take 5 mLs (500 mg of elemental calcium total) by mouth every 6 (six) hours as needed for indigestion. 450 mL 0   carvedilol (COREG) 12.5 MG tablet Take 1 tablet (12.5 mg total) by mouth 2 (two) times daily with a meal. (Patient not taking: Reported on 02/23/2022) 60 tablet 2   cloNIDine (CATAPRES - DOSED IN MG/24 HR) 0.2 mg/24hr patch Place 1 patch (0.2 mg total) onto the skin once a week. 4 patch 2   cycloSPORINE modified (NEORAL) 100 MG capsule Take 2 capsules (200 mg total) by mouth 2 (two) times daily.     cycloSPORINE modified (NEORAL) 25 MG capsule Take 25 mg by mouth daily.     Darbepoetin Alfa (ARANESP) 60 MCG/0.3ML SOSY injection Inject 0.3 mLs (60 mcg total) into the vein every Friday with hemodialysis. 4.2 mL    doxercalciferol (HECTOROL) 4 MCG/2ML injection Inject 3 mLs (6 mcg total) into the vein every Monday, Wednesday, and Friday with hemodialysis. 1 mL    DULoxetine  (CYMBALTA) 20 MG capsule Take 1 capsule (20 mg total) by mouth daily. 30 capsule 2   hydrALAZINE (APRESOLINE) 25 MG tablet Take 3 tablets (75 mg total) by mouth every 8 (eight) hours. 270 tablet 2   lidocaine-prilocaine (EMLA) cream Apply 1 application topically daily as needed (port access).     losartan (COZAAR) 100 MG tablet Take 1 tablet (100 mg total) by mouth daily. 30 tablet 2   metoCLOPramide (REGLAN) 5 MG tablet Take 5 mg by mouth every 6 (six) hours as needed for nausea or vomiting.     mycophenolate (MYFORTIC) 180 MG EC tablet Take 540 mg by mouth 2 (two) times daily.     ondansetron (ZOFRAN) 4 MG tablet Take 4 mg by mouth every 8 (eight) hours as needed for nausea or vomiting.     pantoprazole (PROTONIX) 40 MG tablet Take 1 tablet (40 mg total) by mouth daily. 30 tablet 2   predniSONE (DELTASONE) 5 MG tablet Take 15 mg by mouth daily.  5   sodium zirconium cyclosilicate (LOKELMA) 10 g PACK packet Take 10 g by mouth as directed. Take on Non-Dialysis (Sun,Tues, Thurs, Sat)     Labs: Basic Metabolic Panel: Recent Labs  Lab 03/05/22 2356 03/06/22 1730  NA 139 140  K 5.0 5.8*  CL 101 101  CO2 22 22  GLUCOSE  108* 108*  BUN 49* 59*  CREATININE 8.09* 9.59*  CALCIUM 9.7 9.7   Liver Function Tests: Recent Labs  Lab 03/06/22 1730  AST 10*  ALT 10  ALKPHOS 58  BILITOT 1.2  PROT 7.1  ALBUMIN 3.7   CBC: Recent Labs  Lab 03/05/22 2356 03/06/22 1730  WBC 4.7 7.4  HGB 9.4* 10.5*  HCT 29.1* 32.9*  MCV 92.7 94.5  PLT 129* 139*   Studies/Results: CT ABDOMEN PELVIS WO CONTRAST  Result Date: 03/06/2022 CLINICAL DATA:  Nonlocalized abdominal pain for 24 hours. EXAM: CT ABDOMEN AND PELVIS WITHOUT CONTRAST TECHNIQUE: Multidetector CT imaging of the abdomen and pelvis was performed following the standard protocol without IV contrast. RADIATION DOSE REDUCTION: This exam was performed according to the departmental dose-optimization program which includes automated exposure control,  adjustment of the mA and/or kV according to patient size and/or use of iterative reconstruction technique. COMPARISON:  01/30/2022 FINDINGS: Lower chest: Small bilateral pleural effusions are associated with diffuse ground-glass opacity in both lung bases. Trace pericardial effusion evident. Hepatobiliary: No suspicious focal abnormality in the liver on this study without intravenous contrast. There is no evidence for gallstones, gallbladder wall thickening, or pericholecystic fluid. No intrahepatic or extrahepatic biliary dilation. Pancreas: No focal mass lesion. No dilatation of the main duct. No intraparenchymal cyst. No peripancreatic edema. Spleen: Spleen size upper normal to mildly enlarged. Adrenals/Urinary Tract: No adrenal nodule or mass. Both kidneys are atrophic. No evidence for hydroureter. The urinary bladder appears normal for the degree of distention. Stomach/Bowel: Stomach is unremarkable. No gastric wall thickening. No evidence of outlet obstruction. Duodenum is normally positioned as is the ligament of Treitz. No small bowel wall thickening. No small bowel dilatation. Neither the terminal ileum nor the appendix are discretely visible on this study without oral or intravenous contrast material. No gross colonic mass. No colonic wall thickening. Vascular/Lymphatic: There is advanced atherosclerotic calcification of the abdominal aorta without aneurysm. Marked wall calcification noted in dominant arterial anatomy of the abdomen and pelvis. There is no gastrohepatic or hepatoduodenal ligament lymphadenopathy. No retroperitoneal or mesenteric lymphadenopathy. No pelvic sidewall lymphadenopathy. Reproductive: Unremarkable. Other: No intraperitoneal free fluid. Musculoskeletal: No worrisome lytic or sclerotic osseous abnormality. IMPRESSION: 1. No acute findings in the abdomen or pelvis. Specifically, no findings to explain the patient's history of abdominal pain. 2. Small bilateral pleural effusions with  diffuse ground-glass opacity in both lung bases. Imaging features can be seen in the setting of pulmonary edema. 3. Atrophic kidneys. 4. Aortic Atherosclerosis (ICD10-I70.0). Electronically Signed   By: Misty Stanley M.D.   On: 03/06/2022 20:39   DG Chest 2 View  Result Date: 03/06/2022 CLINICAL DATA:  Chest pain EXAM: CHEST - 2 VIEW COMPARISON:  03/05/2022 FINDINGS: Stable cardiomegaly. Pulmonary vascular congestion with mild diffuse interstitial prominence, which may be minimally improved from prior. No new focal consolidation. No pleural effusion or pneumothorax. IMPRESSION: Findings suggestive of CHF with mild edema, which may be minimally improved from prior. Electronically Signed   By: Davina Poke D.O.   On: 03/06/2022 17:48   DG Chest 2 View  Result Date: 03/06/2022 CLINICAL DATA:  Chest pain and nausea. EXAM: CHEST - 2 VIEW COMPARISON:  None Available. FINDINGS: There is stable mild to moderate severity enlargement of the cardiac silhouette. Stable moderate severity diffusely increased interstitial lung markings are seen. There is no evidence of focal consolidation, pleural effusion or pneumothorax. The visualized skeletal structures are unremarkable. IMPRESSION: Stable cardiomegaly with moderate severity interstitial lung disease. Electronically Signed  By: Virgina Norfolk M.D.   On: 03/06/2022 00:35    Dialysis Orders:  MWF at [facility] 3:45hr, 400/500, EDW 54.2kg, 2K/2Ca, RUE AVF, no heparin - Mircera 74mg IV q 2 weeks (last 6/26) - Hectoral 856m IV q HD - Sensipar '90mg'$  PO q HD - Binder: Auryxia 3/meals  Assessment/Plan:  Chest pain/Abdominal pain: EKG stable, trops flat. Possibly d/t #2.  Pulm edema/B pleural effusions: Per OP records, looks like has been losing weight recently, dry weight being lowered but not far enough. 3L UFG with HD today, will lower EDW further on discharge.  ESRD: HD today, then back to usual MWF schedule tomorrow. 2K bath for mild hyperkalemia, 3L UFG.   Hypertension/volume: BP very high with fluid on CXR - challenging as above.  Anemia of ESRD: Hgb stable, not due for ESA yet.  Metabolic bone disease: Ca ok, resuming home meds.  Hx dual pancreas/kidney transplant (pancreas still functional): Continue IS regimen.  KaVeneta PentonPA-C 03/07/2022, 10:52 AM  [employer]

## 2022-03-07 NOTE — Progress Notes (Signed)
Notified MD and pharmacy concerning Red med refusal. Documented on worklist. MD requesting pharmacy be made aware of cyclosporine dosing refusal.  Pharmacy asking that doctor be made aware of sq heparin refusal. Payton Emerald, RN

## 2022-03-07 NOTE — ED Notes (Signed)
Patient put in recliner for safety. Patient keeps trying to get up and walk around with oxygen tubing still connected to her. Advised for her safety to stay seated.

## 2022-03-08 DIAGNOSIS — Z992 Dependence on renal dialysis: Secondary | ICD-10-CM | POA: Diagnosis not present

## 2022-03-08 DIAGNOSIS — N186 End stage renal disease: Secondary | ICD-10-CM | POA: Diagnosis not present

## 2022-03-08 LAB — BASIC METABOLIC PANEL
Anion gap: 14 (ref 5–15)
BUN: 51 mg/dL — ABNORMAL HIGH (ref 6–20)
CO2: 28 mmol/L (ref 22–32)
Calcium: 9.4 mg/dL (ref 8.9–10.3)
Chloride: 97 mmol/L — ABNORMAL LOW (ref 98–111)
Creatinine, Ser: 7.52 mg/dL — ABNORMAL HIGH (ref 0.44–1.00)
GFR, Estimated: 6 mL/min — ABNORMAL LOW (ref >60)
Glucose, Bld: 125 mg/dL — ABNORMAL HIGH (ref 70–99)
Potassium: 5.9 mmol/L — ABNORMAL HIGH (ref 3.5–5.1)
Sodium: 139 mmol/L (ref 135–145)

## 2022-03-08 LAB — SURGICAL PCR SCREEN
MRSA, PCR: NEGATIVE
Staphylococcus aureus: POSITIVE — AB

## 2022-03-08 LAB — MAGNESIUM: Magnesium: 2.2 mg/dL (ref 1.7–2.4)

## 2022-03-08 LAB — CBC
HCT: 27.9 % — ABNORMAL LOW (ref 36.0–46.0)
Hemoglobin: 9.2 g/dL — ABNORMAL LOW (ref 12.0–15.0)
MCH: 30.6 pg (ref 26.0–34.0)
MCHC: 33 g/dL (ref 30.0–36.0)
MCV: 92.7 fL (ref 80.0–100.0)
Platelets: 118 10*3/uL — ABNORMAL LOW (ref 150–400)
RBC: 3.01 MIL/uL — ABNORMAL LOW (ref 3.87–5.11)
RDW: 13.7 % (ref 11.5–15.5)
WBC: 5 10*3/uL (ref 4.0–10.5)
nRBC: 0 % (ref 0.0–0.2)

## 2022-03-08 LAB — POTASSIUM: Potassium: 3.7 mmol/L (ref 3.5–5.1)

## 2022-03-08 MED ORDER — MUPIROCIN 2 % EX OINT
1.0000 | TOPICAL_OINTMENT | Freq: Two times a day (BID) | CUTANEOUS | Status: DC
  Administered 2022-03-08 (×2): 1 via NASAL
  Filled 2022-03-08: qty 22

## 2022-03-08 MED ORDER — CHLORHEXIDINE GLUCONATE CLOTH 2 % EX PADS
6.0000 | MEDICATED_PAD | Freq: Every day | CUTANEOUS | Status: DC
  Administered 2022-03-08: 6 via TOPICAL

## 2022-03-08 NOTE — Care Management Obs Status (Signed)
[facility] NOTIFICATION   Patient Details  Name: [name] [name] MRN: [id number] Date of Birth: 1981-08-24   Medicare Observation Status Notification Given:       Cyndi Bender, RN 03/08/2022, 2:26 PM

## 2022-03-08 NOTE — Progress Notes (Signed)
Received patient in bed, alert and oriented. Informed consent signed and in chart.  Time tx completed:1048  HD treatment ended early Patient tolerated well. Fistulawithout signs and symptoms of complications. Patient transported back to the room, alert and orient and in no acute distress. Report given to bedside RN.  Total UF removed:1200  Medication given:  Post HD VS:98.3,102,19,177/76  Post HD weight: 54.9kg

## 2022-03-08 NOTE — Progress Notes (Signed)
Patient ID: [name] [name], female   DOB: 08/19/1981, 41 y.o.   MRN: [id number] S: Continues to ask for ativan to be given IV.  "It works faster".  Admits to being very anxious. O:BP (!) 177/79 (BP Location: Left Arm)   Pulse (!) 104   Temp 97.6 F (36.4 C) (Oral)   Resp 20   Wt 54.4 kg   SpO2 95%   BMI 21.94 kg/m   Intake/Output Summary (Last 24 hours) at 03/08/2022 0836 Last data filed at 03/08/2022 0500 Gross per 24 hour  Intake 690 ml  Output 1 ml  Net 689 ml   Intake/Output: I/O last 3 completed shifts: In: 690 [P.O.:690] Out: 1 [Stool:1]  Intake/Output this shift:  No intake/output data recorded. Weight change:  Gen:NAD CVS: tachy Resp: CTA Abd: +BS, soft, NT/ND Ext: no edema, RUE AVF +T/B  Recent Labs  Lab 03/05/22 2356 03/06/22 1730 03/08/22 0134  NA 139 140 139  K 5.0 5.8* 5.9*  CL 101 101 97*  CO2 '22 22 28  '$ GLUCOSE 108* 108* 125*  BUN 49* 59* 51*  CREATININE 8.09* 9.59* 7.52*  ALBUMIN  --  3.7  --   CALCIUM 9.7 9.7 9.4  AST  --  10*  --   ALT  --  10  --    Liver Function Tests: Recent Labs  Lab 03/06/22 1730  AST 10*  ALT 10  ALKPHOS 58  BILITOT 1.2  PROT 7.1  ALBUMIN 3.7   No results for input(s): "LIPASE", "AMYLASE" in the last 168 hours. No results for input(s): "AMMONIA" in the last 168 hours. CBC: Recent Labs  Lab 03/05/22 2356 03/06/22 1730 03/08/22 0134  WBC 4.7 7.4 5.0  HGB 9.4* 10.5* 9.2*  HCT 29.1* 32.9* 27.9*  MCV 92.7 94.5 92.7  PLT 129* 139* 118*   Cardiac Enzymes: No results for input(s): "CKTOTAL", "CKMB", "CKMBINDEX", "TROPONINI" in the last 168 hours. CBG: No results for input(s): "GLUCAP" in the last 168 hours.  Iron Studies: No results for input(s): "IRON", "TIBC", "TRANSFERRIN", "FERRITIN" in the last 72 hours. Studies/Results: CT ABDOMEN PELVIS WO CONTRAST  Result Date: 03/06/2022 CLINICAL DATA:  Nonlocalized abdominal pain for 24 hours. EXAM: CT ABDOMEN AND PELVIS WITHOUT CONTRAST TECHNIQUE: Multidetector  CT imaging of the abdomen and pelvis was performed following the standard protocol without IV contrast. RADIATION DOSE REDUCTION: This exam was performed according to the departmental dose-optimization program which includes automated exposure control, adjustment of the mA and/or kV according to patient size and/or use of iterative reconstruction technique. COMPARISON:  01/30/2022 FINDINGS: Lower chest: Small bilateral pleural effusions are associated with diffuse ground-glass opacity in both lung bases. Trace pericardial effusion evident. Hepatobiliary: No suspicious focal abnormality in the liver on this study without intravenous contrast. There is no evidence for gallstones, gallbladder wall thickening, or pericholecystic fluid. No intrahepatic or extrahepatic biliary dilation. Pancreas: No focal mass lesion. No dilatation of the main duct. No intraparenchymal cyst. No peripancreatic edema. Spleen: Spleen size upper normal to mildly enlarged. Adrenals/Urinary Tract: No adrenal nodule or mass. Both kidneys are atrophic. No evidence for hydroureter. The urinary bladder appears normal for the degree of distention. Stomach/Bowel: Stomach is unremarkable. No gastric wall thickening. No evidence of outlet obstruction. Duodenum is normally positioned as is the ligament of Treitz. No small bowel wall thickening. No small bowel dilatation. Neither the terminal ileum nor the appendix are discretely visible on this study without oral or intravenous contrast material. No gross colonic mass. No colonic wall  thickening. Vascular/Lymphatic: There is advanced atherosclerotic calcification of the abdominal aorta without aneurysm. Marked wall calcification noted in dominant arterial anatomy of the abdomen and pelvis. There is no gastrohepatic or hepatoduodenal ligament lymphadenopathy. No retroperitoneal or mesenteric lymphadenopathy. No pelvic sidewall lymphadenopathy. Reproductive: Unremarkable. Other: No intraperitoneal free  fluid. Musculoskeletal: No worrisome lytic or sclerotic osseous abnormality. IMPRESSION: 1. No acute findings in the abdomen or pelvis. Specifically, no findings to explain the patient's history of abdominal pain. 2. Small bilateral pleural effusions with diffuse ground-glass opacity in both lung bases. Imaging features can be seen in the setting of pulmonary edema. 3. Atrophic kidneys. 4. Aortic Atherosclerosis (ICD10-I70.0). Electronically Signed   By: Misty Stanley M.D.   On: 03/06/2022 20:39   DG Chest 2 View  Result Date: 03/06/2022 CLINICAL DATA:  Chest pain EXAM: CHEST - 2 VIEW COMPARISON:  03/05/2022 FINDINGS: Stable cardiomegaly. Pulmonary vascular congestion with mild diffuse interstitial prominence, which may be minimally improved from prior. No new focal consolidation. No pleural effusion or pneumothorax. IMPRESSION: Findings suggestive of CHF with mild edema, which may be minimally improved from prior. Electronically Signed   By: Davina Poke D.O.   On: 03/06/2022 17:48    amLODipine  10 mg Oral Daily   aspirin  81 mg Oral Daily   carvedilol  12.5 mg Oral BID WC   Chlorhexidine Gluconate Cloth  6 each Topical Q0600   cinacalcet  90 mg Oral Once per day on Mon Wed Fri   cloNIDine  0.2 mg Transdermal Weekly   cycloSPORINE modified  200 mg Oral BID   cycloSPORINE modified  25 mg Oral Q1400   doxercalciferol  8 mcg Intravenous Q M,W,F-HD   DULoxetine  20 mg Oral Daily   ferric citrate  630 mg Oral TID WC   heparin  5,000 Units Subcutaneous Q8H   hydrALAZINE  75 mg Oral Q8H   mupirocin ointment  1 Application Nasal BID   mycophenolate  540 mg Oral BID   oxyCODONE-acetaminophen  1 tablet Oral TID   pantoprazole  40 mg Oral Daily   predniSONE  15 mg Oral Daily   sodium chloride flush  3 mL Intravenous Q12H   sodium zirconium cyclosilicate  10 g Oral Q T,Th,S,Su   zolpidem  5 mg Oral QHS    BMET    Component Value Date/Time   NA 139 03/08/2022 0134   K 5.9 (H) 03/08/2022 0134    CL 97 (L) 03/08/2022 0134   CO2 28 03/08/2022 0134   GLUCOSE 125 (H) 03/08/2022 0134   BUN 51 (H) 03/08/2022 0134   CREATININE 7.52 (H) 03/08/2022 0134   CALCIUM 9.4 03/08/2022 0134   GFRNONAA 6 (L) 03/08/2022 0134   GFRAA  02/02/2022 1740    PATIENT IDENTIFICATION ERROR. PLEASE DISREGARD RESULTS. ACCOUNT WILL BE CREDITED.   CBC    Component Value Date/Time   WBC 5.0 03/08/2022 0134   RBC 3.01 (L) 03/08/2022 0134   HGB 9.2 (L) 03/08/2022 0134   HCT 27.9 (L) 03/08/2022 0134   PLT 118 (L) 03/08/2022 0134   MCV 92.7 03/08/2022 0134   MCH 30.6 03/08/2022 0134   MCHC 33.0 03/08/2022 0134   RDW 13.7 03/08/2022 0134   LYMPHSABS 1.0 12/24/2021 0230   MONOABS 0.2 12/24/2021 0230   EOSABS 0.0 12/24/2021 0230   BASOSABS 0.0 12/24/2021 0230    Dialysis Orders:  MWF at [facility] 3:45hr, 400/500, EDW 54.2kg, 2K/2Ca, RUE AVF, no heparin - Mircera 62mg IV q 2 weeks (last  6/26) - Hectoral 76mg IV q HD - Sensipar '90mg'$  PO q HD - Binder: Auryxia 3/meals   Assessment/Plan:  Chest pain/Abdominal pain: EKG stable, trops flat.  Likely due to depression/anxiety.  Pulm edema/B pleural effusions: Per OP records, looks like has been losing weight recently, dry weight being lowered but not far enough. 3L UFG with HD today, will lower EDW further on discharge.  Able to UF 2.7 L with HD yesterday and will have HD again today.  ESRD: HD again today to get back to usual MWF schedule tomorrow. 2K bath for mild hyperkalemia, 3L UFG.  Hypertension/volume: BP very high with fluid on CXR - challenging as above.  Anemia of ESRD: Hgb stable, not due for ESA yet.  Metabolic bone disease: Ca ok, resuming home meds.  Hx dual pancreas/kidney transplant (pancreas still functional): Continue IS regimen. Hyperkalemia- unclear how her K could have risen after HD yesterday.  Will recheck after HD today.  JDonetta Potts MD [facility]

## 2022-03-08 NOTE — Discharge Summary (Signed)
[name]  Physician Discharge Summary   Patient ID: [name] [name] MRN: [id number] DOB/AGE: 1980/11/12 41 y.o.  Admit date: 03/06/2022 Discharge date: 03/08/2022    PCP: [facility] [place]:  Principal Problem:   ESRD needing dialysis [facility]) Active Problems:   Hypertensive urgency   Type 2 diabetes mellitus with other diabetic kidney complication (HCC)   Hyperkalemia   Diabetic gastroparesis associated with type 1 diabetes mellitus (HCC)   Chronic pain   ESRD (end stage renal disease) ([place])   RECOMMENDATIONS FOR OUTPATIENT FOLLOW UP: Patient encouraged to keep her outpatient appointments and dialysis schedule   Home Health: None Equipment/Devices: None  CODE STATUS: Full code  DISCHARGE CONDITION: fair  Diet recommendation: As before  INITIAL HISTORY:  [name] [name] is a pleasant 41 y.o. 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  Patient was seen in the emergency department the evening of 03/05/2022 with chest pain and abdominal pain, was treated, and discharged the following morning but missed outpatient dialysis and now returns with recurrent chest and abdominal pain.  She reports completing hemodialysis on 03/03/2022.  Chest pain is severe, constant, and localized to the central chest.  Abdominal pain is mainly in the upper abdomen and similar to chronic abdominal pain that she suffers from.  She saw a new pain management physician on 03/03/2022 and was offered nerve block.  She reports worsening in her chronic nausea.  Denies fevers or chills.   ED Course: Upon arrival to the ED, patient is found to be afebrile and saturating well on 2 L/min of supplemental oxygen with elevated blood pressures.  EKG was sinus rhythm and nonspecific ST-T  abnormalities.  Troponin is slightly elevated.  Chest x-ray concerning for mild edema.  CT of the abdomen pelvis is notable for small bilateral pleural effusions and pulmonary edema but no acute intra-abdominal or pelvic abnormality.  Chemistry panel features a potassium of 5.8, normal bicarbonate, and BUN 59.  CBC was stable chronic anemia and mild thrombocytopenia.  Patient was treated with Lokelma and Ativan in the ED.  Nephrology was consulted by the ED physician.  Consultations: Nephrology  Procedures: Hemodialysis   HOSPITAL COURSE:   Patient presented with chest pain.  Found to have pulmonary edema.  This is secondary to having missed hemodialysis recently.  She was seen by nephrology.  Was dialyzed.  Symptoms improved.  Saturations are normal on room air.  Blood pressure remains poorly controlled.  She was continued on her home medications.  Compliance is questionable.  Will need to be monitored closely in the outpatient setting.  History of pancreatic and renal transplant.  Continued on her immunosuppressants including cyclosporine mycophenolate and prednisone.  Chronic pain and anxiety was also managed.  She continues to ask for parenteral narcotics as well as anxiety medications.  She was educated about proper use of these medications and avoid parenteral use is much as possible.  CT of the abdomen pelvis did not show any acute findings.  Overall patient has improved.  Okay for discharge today after dialysis.   PERTINENT LABS:  The results of significant diagnostics from this hospitalization (including imaging, microbiology, ancillary and laboratory) are listed below for reference.    Microbiology: Recent Results (from the past 240 hour(s))  Surgical PCR screen     Status: Abnormal   Collection Time:  03/07/22  9:20 PM   Specimen: Nasal Mucosa; Nasal Swab  Result Value Ref Range Status   MRSA, PCR NEGATIVE NEGATIVE Final   Staphylococcus aureus POSITIVE (A) NEGATIVE Final     Comment: (NOTE) The Xpert SA Assay (FDA approved for NASAL specimens in patients 70 years of age and older), is one component of a comprehensive surveillance program. It is not intended to diagnose infection nor to guide or monitor treatment. Performed at [facility], [place] [address] [place], [place] [address]      Labs:   Basic Metabolic Panel: Recent Labs  Lab 03/05/22 2356 03/06/22 1730 03/08/22 0134 03/08/22 1245  NA 139 140 139  --   K 5.0 5.8* 5.9* 3.7  CL 101 101 97*  --   CO2 '22 22 28  '$ --   GLUCOSE 108* 108* 125*  --   BUN 49* 59* 51*  --   CREATININE 8.09* 9.59* 7.52*  --   CALCIUM 9.7 9.7 9.4  --   MG  --   --  2.2  --    Liver Function Tests: Recent Labs  Lab 03/06/22 1730  AST 10*  ALT 10  ALKPHOS 58  BILITOT 1.2  PROT 7.1  ALBUMIN 3.7    CBC: Recent Labs  Lab 03/05/22 2356 03/06/22 1730 03/08/22 0134  WBC 4.7 7.4 5.0  HGB 9.4* 10.5* 9.2*  HCT 29.1* 32.9* 27.9*  MCV 92.7 94.5 92.7  PLT 129* 139* 118*      IMAGING STUDIES CT ABDOMEN PELVIS WO CONTRAST  Result Date: 03/06/2022 CLINICAL DATA:  Nonlocalized abdominal pain for 24 hours. EXAM: CT ABDOMEN AND PELVIS WITHOUT CONTRAST TECHNIQUE: Multidetector CT imaging of the abdomen and pelvis was performed following the standard protocol without IV contrast. RADIATION DOSE REDUCTION: This exam was performed according to the departmental dose-optimization program which includes automated exposure control, adjustment of the mA and/or kV according to patient size and/or use of iterative reconstruction technique. COMPARISON:  01/30/2022 FINDINGS: Lower chest: Small bilateral pleural effusions are associated with diffuse ground-glass opacity in both lung bases. Trace pericardial effusion evident. Hepatobiliary: No suspicious focal abnormality in the liver on this study without intravenous contrast. There is no evidence for gallstones, gallbladder wall thickening, or pericholecystic fluid. No  intrahepatic or extrahepatic biliary dilation. Pancreas: No focal mass lesion. No dilatation of the main duct. No intraparenchymal cyst. No peripancreatic edema. Spleen: Spleen size upper normal to mildly enlarged. Adrenals/Urinary Tract: No adrenal nodule or mass. Both kidneys are atrophic. No evidence for hydroureter. The urinary bladder appears normal for the degree of distention. Stomach/Bowel: Stomach is unremarkable. No gastric wall thickening. No evidence of outlet obstruction. Duodenum is normally positioned as is the ligament of Treitz. No small bowel wall thickening. No small bowel dilatation. Neither the terminal ileum nor the appendix are discretely visible on this study without oral or intravenous contrast material. No gross colonic mass. No colonic wall thickening. Vascular/Lymphatic: There is advanced atherosclerotic calcification of the abdominal aorta without aneurysm. Marked wall calcification noted in dominant arterial anatomy of the abdomen and pelvis. There is no gastrohepatic or hepatoduodenal ligament lymphadenopathy. No retroperitoneal or mesenteric lymphadenopathy. No pelvic sidewall lymphadenopathy. Reproductive: Unremarkable. Other: No intraperitoneal free fluid. Musculoskeletal: No worrisome lytic or sclerotic osseous abnormality. IMPRESSION: 1. No acute findings in the abdomen or pelvis. Specifically, no findings to explain the patient's history of abdominal pain. 2. Small bilateral pleural effusions with diffuse ground-glass opacity in both lung bases. Imaging features can be seen  in the setting of pulmonary edema. 3. Atrophic kidneys. 4. Aortic Atherosclerosis (ICD10-I70.0). Electronically Signed   By: Misty Stanley M.D.   On: 03/06/2022 20:39   DG Chest 2 View  Result Date: 03/06/2022 CLINICAL DATA:  Chest pain EXAM: CHEST - 2 VIEW COMPARISON:  03/05/2022 FINDINGS: Stable cardiomegaly. Pulmonary vascular congestion with mild diffuse interstitial prominence, which may be minimally  improved from prior. No new focal consolidation. No pleural effusion or pneumothorax. IMPRESSION: Findings suggestive of CHF with mild edema, which may be minimally improved from prior. Electronically Signed   By: Davina Poke D.O.   On: 03/06/2022 17:48   DG Chest 2 View  Result Date: 03/06/2022 CLINICAL DATA:  Chest pain and nausea. EXAM: CHEST - 2 VIEW COMPARISON:  None Available. FINDINGS: There is stable mild to moderate severity enlargement of the cardiac silhouette. Stable moderate severity diffusely increased interstitial lung markings are seen. There is no evidence of focal consolidation, pleural effusion or pneumothorax. The visualized skeletal structures are unremarkable. IMPRESSION: Stable cardiomegaly with moderate severity interstitial lung disease. Electronically Signed   By: Virgina Norfolk M.D.   On: 03/06/2022 00:35    DISCHARGE EXAMINATION: Vitals:   03/08/22 1030 03/08/22 1054 03/08/22 1105 03/08/22 1207  BP: (!) 192/84  (!) 177/76 (!) 175/86  Pulse: (!) 102  (!) 101 (!) 103  Resp: (!) '21  19 20  '$ Temp:    98.3 F (36.8 C)  TempSrc:    Oral  SpO2: 98%  95% 95%  Weight:  54.9 kg 54.9 kg    General appearance: Awake alert.  In no distress Resp: Clear to auscultation bilaterally.  Normal effort Cardio: S1-S2 is normal regular.  No S3-S4.  No rubs murmurs or bruit GI: Abdomen is soft.  Nontender nondistended.  Bowel sounds are present normal.  No masses organomegaly   DISPOSITION: Home  Discharge Instructions     Call MD for:  difficulty breathing, headache or visual disturbances   Complete by: As directed    Call MD for:  extreme fatigue   Complete by: As directed    Call MD for:  persistant dizziness or light-headedness   Complete by: As directed    Call MD for:  persistant nausea and vomiting   Complete by: As directed    Call MD for:  severe uncontrolled pain   Complete by: As directed    Call MD for:  temperature >100.4   Complete by: As directed     Diet - low sodium heart healthy   Complete by: As directed    Discharge instructions   Complete by: As directed    Please be sure to follow-up with your PCP for further management of your high blood pressure.  Please keep your usual dialysis schedule.  You were cared for by a hospitalist during your hospital stay. If you have any questions about your discharge medications or the care you received while you were in the hospital after you are discharged, you can call the unit and asked to speak with the hospitalist on call if the hospitalist that took care of you is not available. Once you are discharged, your primary care physician will handle any further medical issues. Please note that NO REFILLS for any discharge medications will be authorized once you are discharged, as it is imperative that you return to your primary care physician (or establish a relationship with a primary care physician if you do not have one) for your aftercare needs so that they  can reassess your need for medications and monitor your lab values. If you do not have a primary care physician, you can call [phone] for a physician referral.   Increase activity slowly   Complete by: As directed    No wound care   Complete by: As directed          Allergies as of 03/08/2022   No Known Allergies      Medication List     STOP taking these medications    calcium acetate 667 MG capsule Commonly known as: PHOSLO       TAKE these medications    Ambien 5 MG tablet Generic drug: zolpidem Take 5 mg by mouth at bedtime.   amLODipine 10 MG tablet Commonly known as: NORVASC Take 1 tablet (10 mg total) by mouth daily. What changed: when to take this   aspirin EC 81 MG tablet Chew 81 mg by mouth daily.   Auryxia 1 GM 210 MG(Fe) tablet Generic drug: ferric citrate Take 210-630 mg by mouth See admin instructions. Take 3 tablets (630 mg) by mouth three times daily with meals, take 1 tablet (210 mg) with snacks    calcium carbonate (dosed in mg elemental calcium) 1250 MG/5ML Susp Take 5 mLs (500 mg of elemental calcium total) by mouth every 6 (six) hours as needed for indigestion.   carvedilol 12.5 MG tablet Commonly known as: COREG Take 1 tablet (12.5 mg total) by mouth 2 (two) times daily with a meal.   cloNIDine 0.2 mg/24hr patch Commonly known as: CATAPRES - Dosed in mg/24 hr Place 1 patch (0.2 mg total) onto the skin once a week. What changed: when to take this   cycloSPORINE modified 25 MG capsule Commonly known as: NEORAL Take 25 mg by mouth daily.   cycloSPORINE modified 100 MG capsule Commonly known as: NEORAL Take 2 capsules (200 mg total) by mouth 2 (two) times daily.   Darbepoetin Alfa 60 MCG/0.3ML Sosy injection Commonly known as: ARANESP Inject 0.3 mLs (60 mcg total) into the vein every Friday with hemodialysis.   doxercalciferol 4 MCG/2ML injection Commonly known as: HECTOROL Inject 3 mLs (6 mcg total) into the vein every Monday, Wednesday, and Friday with hemodialysis.   DULoxetine 20 MG capsule Commonly known as: CYMBALTA Take 1 capsule (20 mg total) by mouth daily.   hydrALAZINE 25 MG tablet Commonly known as: APRESOLINE Take 3 tablets (75 mg total) by mouth every 8 (eight) hours.   lidocaine-prilocaine cream Commonly known as: EMLA Apply 1 application  topically daily as needed (prior to port access).   Lokelma 10 g Pack packet Generic drug: sodium zirconium cyclosilicate Take 10 g by mouth See admin instructions. Take one packet (10 g) by mouth (dissolved in water) daily on Non-Dialysis (Sun,Tues, Thurs, Sat)   losartan 100 MG tablet Commonly known as: Cozaar Take 1 tablet (100 mg total) by mouth daily. What changed: when to take this   metoCLOPramide 5 MG tablet Commonly known as: REGLAN Take 5 mg by mouth every 6 (six) hours as needed for nausea or vomiting.   mycophenolate 180 MG EC tablet Commonly known as: MYFORTIC Take 540 mg by mouth 2 (two)  times daily.   ondansetron 4 MG tablet Commonly known as: ZOFRAN Take 4 mg by mouth every 8 (eight) hours as needed for nausea or vomiting.   oxyCODONE-acetaminophen 5-325 MG tablet Commonly known as: PERCOCET/ROXICET Take 1 tablet by mouth 3 (three) times daily.   pantoprazole 40 MG tablet Commonly known  as: PROTONIX Take 1 tablet (40 mg total) by mouth daily.   predniSONE 5 MG tablet Commonly known as: DELTASONE Take 15 mg by mouth daily.          Follow-up [place]. Schedule an appointment as soon as possible for a visit in 3 day(s).   Why: post hospitalization follow up and BP management Contact information: [place] [address] [phone]                 TOTAL DISCHARGE TIME: 35 minutes  [facility] Pager on [email or web address]  03/09/2022, 11:11 AM

## 2022-03-08 NOTE — Progress Notes (Signed)
Patient given discharge instructions. PIV removed. Telemetry box removed, CCMD notified. Patient taken to vehicle in wheelchair by staff.  Daymon Larsen, RN

## 2022-03-08 NOTE — Care Management CC44 (Signed)
Condition Code 44 Documentation Completed  Patient Details  Name: [name] [name] MRN: [id number] Date of Birth: 1980/10/09   Condition Code 44 given:    Patient signature on Condition Code 44 notice:    Documentation of 2 MD's agreement:    Code 44 added to claim:       Cyndi Bender, RN 03/08/2022, 2:27 PM

## 2022-03-08 NOTE — Progress Notes (Signed)
HOSPITAL MEDICINE OVERNIGHT EVENT NOTE    Patient unfortunately has repeatedly asked throughout the evening that her oral benzodiazepines and opiate therapies be converted to intravenous form.  Nursing has repeatedly informed the patient that I advised against making this change.  I have stated that I prefer to not deviate from the plan of care set by the day team.  If the patient wishes to make permanent changes to her benzodiazepine or opiate medications she has been advised to discuss this with her daytime attending.  [name] Emerald  MD [name]

## 2022-03-08 NOTE — Progress Notes (Addendum)
Pt requested Phenergan for nausea. She refused Zofran and Reglan which are already on the PRN med list.  Pt requested Ativan for her anxiety, but she refused to take PO.   She requested all of her med to be IV form. Pt stated IV med works the best for her. Per RN day shift reported, Pt refused some medications and she requested MD and pharmacist to readjust all of her medications base on her preferences.   Dr. Konrad Felix, the on call provider was notified. Initially Dr. Cyd Silence suggested to inform the Pt to discuss with her attending physician in the morning round.   Hemodynamics stable, afebrile. No acute distress. We will monitor.    Kennyth Lose, RN

## 2022-03-09 ENCOUNTER — Telehealth (HOSPITAL_COMMUNITY): Payer: Self-pay | Admitting: Nephrology

## 2022-03-09 NOTE — Telephone Encounter (Signed)
Transition of care contact from inpatient facility  Date of Discharge: 03/08/22 Date of Contact: 03/09/22 - attempted Method of contact: Phone  Attempted to contact patient to discuss transition of care from inpatient admission. Patient did not answer the phone. There was no ability to leave a message, "mailbox full."  Veneta Penton, PA-C [employer] Pager [phone]

## 2022-06-21 ENCOUNTER — Other Ambulatory Visit: Payer: Self-pay | Admitting: Nephrology

## 2022-06-21 DIAGNOSIS — Z1231 Encounter for screening mammogram for malignant neoplasm of breast: Secondary | ICD-10-CM

## 2022-09-18 ENCOUNTER — Encounter (HOSPITAL_COMMUNITY): Payer: Self-pay

## 2022-09-19 ENCOUNTER — Encounter (HOSPITAL_COMMUNITY): Payer: Self-pay

## 2022-09-26 ENCOUNTER — Encounter (HOSPITAL_COMMUNITY): Payer: Medicare Other

## 2023-05-30 ENCOUNTER — Encounter (HOSPITAL_COMMUNITY): Payer: Self-pay

## 2023-06-12 ENCOUNTER — Encounter (HOSPITAL_COMMUNITY): Payer: Self-pay

## 2023-06-12 ENCOUNTER — Other Ambulatory Visit: Payer: Self-pay

## 2023-06-12 ENCOUNTER — Observation Stay (HOSPITAL_COMMUNITY)
Admission: EM | Admit: 2023-06-12 | Discharge: 2023-06-14 | Disposition: A | Discharge status: Home / Self Care | Payer: 59 | Attending: Internal Medicine | Admitting: Internal Medicine

## 2023-06-12 DIAGNOSIS — N186 End stage renal disease: Secondary | ICD-10-CM | POA: Diagnosis not present

## 2023-06-12 DIAGNOSIS — E1022 Type 1 diabetes mellitus with diabetic chronic kidney disease: Secondary | ICD-10-CM | POA: Diagnosis not present

## 2023-06-12 DIAGNOSIS — R109 Unspecified abdominal pain: Secondary | ICD-10-CM | POA: Diagnosis present

## 2023-06-12 DIAGNOSIS — Z87891 Personal history of nicotine dependence: Secondary | ICD-10-CM | POA: Insufficient documentation

## 2023-06-12 DIAGNOSIS — Z79899 Other long term (current) drug therapy: Secondary | ICD-10-CM | POA: Diagnosis not present

## 2023-06-12 DIAGNOSIS — Z9889 Other specified postprocedural states: Secondary | ICD-10-CM | POA: Diagnosis not present

## 2023-06-12 DIAGNOSIS — D61818 Other pancytopenia: Secondary | ICD-10-CM | POA: Diagnosis present

## 2023-06-12 DIAGNOSIS — Z992 Dependence on renal dialysis: Secondary | ICD-10-CM | POA: Diagnosis not present

## 2023-06-12 DIAGNOSIS — Z7982 Long term (current) use of aspirin: Secondary | ICD-10-CM | POA: Diagnosis not present

## 2023-06-12 DIAGNOSIS — N938 Other specified abnormal uterine and vaginal bleeding: Secondary | ICD-10-CM | POA: Diagnosis not present

## 2023-06-12 DIAGNOSIS — I120 Hypertensive chronic kidney disease with stage 5 chronic kidney disease or end stage renal disease: Secondary | ICD-10-CM | POA: Insufficient documentation

## 2023-06-12 DIAGNOSIS — D696 Thrombocytopenia, unspecified: Secondary | ICD-10-CM | POA: Insufficient documentation

## 2023-06-12 DIAGNOSIS — D649 Anemia, unspecified: Secondary | ICD-10-CM | POA: Diagnosis not present

## 2023-06-12 DIAGNOSIS — D259 Leiomyoma of uterus, unspecified: Secondary | ICD-10-CM | POA: Insufficient documentation

## 2023-06-12 DIAGNOSIS — N939 Abnormal uterine and vaginal bleeding, unspecified: Principal | ICD-10-CM

## 2023-06-12 MED ORDER — HYDROMORPHONE HCL 1 MG/ML IJ SOLN
1.0000 mg | Freq: Once | INTRAMUSCULAR | Status: AC
  Administered 2023-06-13: 1 mg via INTRAVENOUS
  Filled 2023-06-12: qty 1

## 2023-06-12 NOTE — ED Triage Notes (Signed)
BIB [facility] from home, vaginal bleeding that has not stopped since a month ago, generalized abdominal pain that worsened today,  Pt had pancreas and kidney transplant in 2015, pancreas are working, kidneys are not, goes to dialysis M,W,F last dialysis Monday.

## 2023-06-13 ENCOUNTER — Emergency Department (HOSPITAL_COMMUNITY): Payer: 59

## 2023-06-13 ENCOUNTER — Encounter (HOSPITAL_COMMUNITY): Payer: Self-pay | Admitting: Internal Medicine

## 2023-06-13 DIAGNOSIS — N938 Other specified abnormal uterine and vaginal bleeding: Secondary | ICD-10-CM

## 2023-06-13 DIAGNOSIS — D649 Anemia, unspecified: Secondary | ICD-10-CM

## 2023-06-13 DIAGNOSIS — N186 End stage renal disease: Secondary | ICD-10-CM

## 2023-06-13 DIAGNOSIS — N939 Abnormal uterine and vaginal bleeding, unspecified: Principal | ICD-10-CM

## 2023-06-13 LAB — PROTIME-INR
INR: 1.2 (ref 0.8–1.2)
Prothrombin Time: 14.9 s (ref 11.4–15.2)

## 2023-06-13 LAB — IRON AND TIBC
Iron: 137 ug/dL (ref 28–170)
Saturation Ratios: 58 % — ABNORMAL HIGH (ref 10.4–31.8)
TIBC: 235 ug/dL — ABNORMAL LOW (ref 250–450)
UIBC: 98 ug/dL

## 2023-06-13 LAB — HEPATITIS B SURFACE ANTIGEN: Hepatitis B Surface Ag: NONREACTIVE

## 2023-06-13 LAB — TRANSFERRIN: Transferrin: 170 mg/dL — ABNORMAL LOW (ref 192–382)

## 2023-06-13 LAB — CBC WITH DIFFERENTIAL/PLATELET
Abs Immature Granulocytes: 0.02 10*3/uL (ref 0.00–0.07)
Basophils Absolute: 0 10*3/uL (ref 0.0–0.1)
Basophils Relative: 0 %
Eosinophils Absolute: 0.1 10*3/uL (ref 0.0–0.5)
Eosinophils Relative: 2 %
HCT: 18.6 % — ABNORMAL LOW (ref 36.0–46.0)
Hemoglobin: 5.9 g/dL — CL (ref 12.0–15.0)
Immature Granulocytes: 1 %
Lymphocytes Relative: 33 %
Lymphs Abs: 1.1 10*3/uL (ref 0.7–4.0)
MCH: 31.7 pg (ref 26.0–34.0)
MCHC: 31.7 g/dL (ref 30.0–36.0)
MCV: 100 fL (ref 80.0–100.0)
Monocytes Absolute: 0.3 10*3/uL (ref 0.1–1.0)
Monocytes Relative: 9 %
Neutro Abs: 1.9 10*3/uL (ref 1.7–7.7)
Neutrophils Relative %: 55 %
Platelets: 119 10*3/uL — ABNORMAL LOW (ref 150–400)
RBC: 1.86 MIL/uL — ABNORMAL LOW (ref 3.87–5.11)
RDW: 14.6 % (ref 11.5–15.5)
WBC: 3.4 10*3/uL — ABNORMAL LOW (ref 4.0–10.5)
nRBC: 0 % (ref 0.0–0.2)

## 2023-06-13 LAB — RETICULOCYTES
Immature Retic Fract: 20.2 % — ABNORMAL HIGH (ref 2.3–15.9)
RBC.: 2.72 MIL/uL — ABNORMAL LOW (ref 3.87–5.11)
Retic Count, Absolute: 150.1 10*3/uL (ref 19.0–186.0)
Retic Ct Pct: 5.5 % — ABNORMAL HIGH (ref 0.4–3.1)

## 2023-06-13 LAB — COMPREHENSIVE METABOLIC PANEL
ALT: 18 U/L (ref 0–44)
AST: 18 U/L (ref 15–41)
Albumin: 3.3 g/dL — ABNORMAL LOW (ref 3.5–5.0)
Alkaline Phosphatase: 62 U/L (ref 38–126)
Anion gap: 18 — ABNORMAL HIGH (ref 5–15)
BUN: 41 mg/dL — ABNORMAL HIGH (ref 6–20)
CO2: 26 mmol/L (ref 22–32)
Calcium: 9.2 mg/dL (ref 8.9–10.3)
Chloride: 96 mmol/L — ABNORMAL LOW (ref 98–111)
Creatinine, Ser: 7.7 mg/dL — ABNORMAL HIGH (ref 0.44–1.00)
GFR, Estimated: 6 mL/min — ABNORMAL LOW (ref >60)
Glucose, Bld: 89 mg/dL (ref 70–99)
Potassium: 5.3 mmol/L — ABNORMAL HIGH (ref 3.5–5.1)
Sodium: 140 mmol/L (ref 135–145)
Total Bilirubin: 0.5 mg/dL (ref 0.3–1.2)
Total Protein: 6.8 g/dL (ref 6.5–8.1)

## 2023-06-13 LAB — HCG, QUANTITATIVE, PREGNANCY: hCG, Beta Chain, Quant, S: 3 m[IU]/mL (ref <5)

## 2023-06-13 LAB — FOLATE: Folate: 19 ng/mL (ref >5.9)

## 2023-06-13 LAB — PHOSPHORUS: Phosphorus: 7.9 mg/dL — ABNORMAL HIGH (ref 2.5–4.6)

## 2023-06-13 LAB — FERRITIN: Ferritin: 490 ng/mL — ABNORMAL HIGH (ref 11–307)

## 2023-06-13 LAB — PREPARE RBC (CROSSMATCH)

## 2023-06-13 LAB — VITAMIN B12: Vitamin B-12: 468 pg/mL (ref 180–914)

## 2023-06-13 LAB — APTT: aPTT: 31 s (ref 24–36)

## 2023-06-13 LAB — HIV ANTIBODY (ROUTINE TESTING W REFLEX): HIV Screen 4th Generation wRfx: NONREACTIVE

## 2023-06-13 MED ORDER — MEGESTROL ACETATE 20 MG PO TABS
20.0000 mg | ORAL_TABLET | Freq: Two times a day (BID) | ORAL | Status: DC
  Administered 2023-06-13: 20 mg via ORAL
  Filled 2023-06-13 (×3): qty 1

## 2023-06-13 MED ORDER — MEGESTROL ACETATE 40 MG PO TABS
40.0000 mg | ORAL_TABLET | ORAL | Status: AC
  Administered 2023-06-13: 40 mg via ORAL
  Filled 2023-06-13: qty 1

## 2023-06-13 MED ORDER — ALUM & MAG HYDROXIDE-SIMETH 200-200-20 MG/5ML PO SUSP
30.0000 mL | Freq: Once | ORAL | Status: DC
  Filled 2023-06-13: qty 30

## 2023-06-13 MED ORDER — MORPHINE SULFATE (PF) 2 MG/ML IV SOLN
1.0000 mg | INTRAVENOUS | Status: DC | PRN
  Administered 2023-06-13 – 2023-06-14 (×2): 1 mg via INTRAVENOUS
  Filled 2023-06-13 (×3): qty 1

## 2023-06-13 MED ORDER — CHLORHEXIDINE GLUCONATE CLOTH 2 % EX PADS: 6.0000 | MEDICATED_PAD | Freq: Every day | CUTANEOUS | Status: DC

## 2023-06-13 MED ORDER — DOXERCALCIFEROL 4 MCG/2ML IV SOLN: 4.0000 ug | INTRAVENOUS | Status: DC

## 2023-06-13 MED ORDER — HYDROMORPHONE HCL 1 MG/ML IJ SOLN
1.0000 mg | Freq: Once | INTRAMUSCULAR | Status: AC
  Administered 2023-06-13: 1 mg via INTRAVENOUS
  Filled 2023-06-13: qty 1

## 2023-06-13 MED ORDER — FERRIC CITRATE 1 GM 210 MG(FE) PO TABS: 210.0000 mg | ORAL_TABLET | ORAL | Status: DC

## 2023-06-13 MED ORDER — FERRIC CITRATE 1 GM 210 MG(FE) PO TABS: 210.0000 mg | ORAL_TABLET | Freq: Two times a day (BID) | ORAL | Status: DC | PRN

## 2023-06-13 MED ORDER — AMLODIPINE BESYLATE 10 MG PO TABS: 10.0000 mg | ORAL_TABLET | Freq: Every day | ORAL | Status: DC

## 2023-06-13 MED ORDER — FERRIC CITRATE 1 GM 210 MG(FE) PO TABS
630.0000 mg | ORAL_TABLET | Freq: Three times a day (TID) | ORAL | Status: DC
  Administered 2023-06-13 – 2023-06-14 (×5): 630 mg via ORAL
  Filled 2023-06-13 (×5): qty 3

## 2023-06-13 MED ORDER — OXYCODONE HCL 5 MG PO TABS
5.0000 mg | ORAL_TABLET | Freq: Four times a day (QID) | ORAL | Status: DC | PRN
  Filled 2023-06-13: qty 1

## 2023-06-13 MED ORDER — MORPHINE SULFATE (PF) 2 MG/ML IV SOLN: 1.0000 mg | INTRAVENOUS | Status: DC | PRN

## 2023-06-13 MED ORDER — ACETAMINOPHEN 500 MG PO TABS: 1000.0000 mg | ORAL_TABLET | Freq: Four times a day (QID) | ORAL | Status: DC | PRN

## 2023-06-13 MED ORDER — LOSARTAN POTASSIUM 50 MG PO TABS
100.0000 mg | ORAL_TABLET | Freq: Every day | ORAL | Status: DC
  Administered 2023-06-13 – 2023-06-14 (×2): 100 mg via ORAL
  Filled 2023-06-13 (×2): qty 2

## 2023-06-13 MED ORDER — SODIUM CHLORIDE 0.9% IV SOLUTION: Freq: Once | INTRAVENOUS | Status: AC

## 2023-06-13 MED ORDER — KETOROLAC TROMETHAMINE 15 MG/ML IJ SOLN
15.0000 mg | Freq: Once | INTRAMUSCULAR | Status: AC
  Administered 2023-06-13: 15 mg via INTRAVENOUS
  Filled 2023-06-13: qty 1

## 2023-06-13 MED ORDER — OXYCODONE HCL 5 MG PO TABS
7.5000 mg | ORAL_TABLET | Freq: Four times a day (QID) | ORAL | Status: DC | PRN
  Administered 2023-06-13 (×2): 7.5 mg via ORAL
  Filled 2023-06-13 (×2): qty 2

## 2023-06-13 MED ORDER — MYCOPHENOLATE SODIUM 180 MG PO TBEC
540.0000 mg | DELAYED_RELEASE_TABLET | Freq: Two times a day (BID) | ORAL | Status: DC
  Administered 2023-06-13 – 2023-06-14 (×2): 540 mg via ORAL
  Filled 2023-06-13 (×3): qty 3

## 2023-06-13 MED ORDER — PREDNISONE 5 MG PO TABS
15.0000 mg | ORAL_TABLET | Freq: Every day | ORAL | Status: DC
  Administered 2023-06-13 – 2023-06-14 (×2): 15 mg via ORAL
  Filled 2023-06-13: qty 1
  Filled 2023-06-13: qty 3

## 2023-06-13 MED ORDER — CYCLOSPORINE MODIFIED (NEORAL) 100 MG PO CAPS
200.0000 mg | ORAL_CAPSULE | Freq: Two times a day (BID) | ORAL | Status: DC
  Administered 2023-06-13 – 2023-06-14 (×2): 200 mg via ORAL
  Filled 2023-06-13 (×4): qty 2

## 2023-06-13 MED ORDER — LIDOCAINE-PRILOCAINE 2.5-2.5 % EX CREA: 1.0000 | TOPICAL_CREAM | Freq: Every day | CUTANEOUS | Status: DC | PRN

## 2023-06-13 MED ORDER — SODIUM ZIRCONIUM CYCLOSILICATE 10 G PO PACK
10.0000 g | PACK | Freq: Every day | ORAL | Status: DC
  Administered 2023-06-13 – 2023-06-14 (×2): 10 g via ORAL
  Filled 2023-06-13 (×2): qty 1

## 2023-06-13 MED ORDER — HYDRALAZINE HCL 50 MG PO TABS
50.0000 mg | ORAL_TABLET | Freq: Three times a day (TID) | ORAL | Status: DC
  Administered 2023-06-13 – 2023-06-14 (×3): 50 mg via ORAL
  Filled 2023-06-13: qty 2
  Filled 2023-06-13 (×2): qty 1

## 2023-06-13 MED ORDER — CINACALCET HCL 30 MG PO TABS
90.0000 mg | ORAL_TABLET | Freq: Every day | ORAL | Status: DC
  Administered 2023-06-13: 90 mg via ORAL
  Filled 2023-06-13: qty 3

## 2023-06-13 MED ORDER — SODIUM CHLORIDE 0.9% FLUSH
3.0000 mL | Freq: Two times a day (BID) | INTRAVENOUS | Status: DC
  Administered 2023-06-13 – 2023-06-14 (×2): 3 mL via INTRAVENOUS

## 2023-06-13 NOTE — Progress Notes (Signed)
No charge progress note.  [name] [name] is a 42 y.o. 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 Saw her PCP last week, and had lab work with dialysis on Monday but hemoglobin is 6.5.  Her HD team had attempted to schedule her for an outpatient transfusion, but this had not been set up yet.  She had worsening lightheadedness, and today think she had a syncopal episode.   She was found to have a hemoglobin of 5.9 on admission. TVUS demonstrating 13 mm uterine fibroid, endometrial lining 3 mm, right ovary not seen.  No adnexal mass or free fluid.  Etiology of her DUB suspected multifactorial with uterine fibroid, uremic platelet dysfunction, thrombocytopenia, question underlying bleeding diathesis (with history of spontaneous SDH).   2 unit of PRBC was ordered and gynecology was consulted and they were recommending starting on Megace twice daily and outpatient follow-up.  Patient likely will get benefit from outpatient hematology evaluation for underlying bleeding disorder.  Posttransfusion hemoglobin 8.4.  Continue to have vaginal bleeding, started on Megace.  Anemia panel with no significant deficiency.   Going for her routine dialysis today.  Her exam was benign.  If hemoglobin remains stable she can go home tomorrow and follow-up with gynecology as outpatient.

## 2023-06-13 NOTE — Hospital Course (Addendum)
Taken from H&P.  [name] [name] is a 42 y.o. 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 Saw her PCP last week, and had lab work with dialysis on Monday but hemoglobin is 6.5.  Her HD team had attempted to schedule her for an outpatient transfusion, but this had not been set up yet.  She had worsening lightheadedness, and today think she had a syncopal episode.   She was found to have a hemoglobin of 5.9 on admission. TVUS demonstrating 13 mm uterine fibroid, endometrial lining 3 mm, right ovary not seen.  No adnexal mass or free fluid.  Etiology of her DUB suspected multifactorial with uterine fibroid, uremic platelet dysfunction, thrombocytopenia, question underlying bleeding diathesis (with history of spontaneous SDH).   2 unit of PRBC was ordered and gynecology was consulted and they were recommending starting on Megace twice daily and outpatient follow-up.  Patient likely will get benefit from outpatient hematology evaluation for underlying bleeding disorder.  Posttransfusion hemoglobin 8.4.  Continue to have vaginal bleeding, started on Megace.  Going for her routine dialysis today.  Anemia panel with no significant deficiency.  10/10: Patient remained stable.  Improving hemoglobin to 9.3.  Vaginal bleeding slowing down significantly.  Patient was given a 7-day supply of Megace 40 mg twice daily as recommended by our gynecologist and need to have a very close follow-up with her gynecology for further management of her uterine fibroids and menorrhagia.  Patient did receive her routine dialysis yesterday and will continue with her scheduled dialysis as outpatient.  Patient will continue on her current medications and need to have a close follow-up  with her providers for further management.

## 2023-06-13 NOTE — H&P (Signed)
History and Physical    [name] [name] ION:[id number] DOB: 09-14-1980 DOA: 06/12/2023  [facility]P: [facility]   Patient coming from: Home   Chief Complaint:  Chief Complaint  Patient presents with   Abdominal Pain    HPI:  [name] [name] is a 42 y.o. 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  Reports no prior history of heavy menstrual periods in past.  1 month ago approximately 9/4 started with normal period, quickly became heavy bleeding.  Requiring to change pad every 15 minutes.  No large clots.  Saw her PCP last week, and had lab work with dialysis on Monday but hemoglobin is 6.5.  Her HD team had attempted to schedule her for an outpatient transfusion, but this had not been set up yet.  She had worsening lightheadedness, and today think she had a syncopal episode.  Woke up to EMS.  Has severe associated pelvic pain, which radiates to right upper quadrant.  Other than her vaginal bleeding denies of the source of bleeding.  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   Review of Systems:  ROS complete and negative except as marked above   No Known Allergies  Prior to Admission medications   Medication Sig Start Date End Date Taking? Authorizing Provider  amLODipine (NORVASC) 10 MG tablet Take 1 tablet (10 mg total) by mouth daily. Patient taking differently: Take 10 mg by mouth at bedtime. 02/05/22 05/06/22  Lorin Glass, MD  aspirin EC 81 MG tablet Chew 81 mg by mouth daily.    [provider]  carvedilol (COREG) 12.5 MG tablet Take 1 tablet (12.5 mg total) by mouth 2 (two) times daily  with a meal. Patient not taking: Reported on 02/23/2022 02/05/22 05/06/22  Lorin Glass, MD  cycloSPORINE modified (NEORAL) 100 MG capsule Take 2 capsules (200 mg total) by mouth 2 (two) times daily. 12/25/21   Pokhrel, Rebekah Chesterfield, MD  cycloSPORINE modified (NEORAL) 25 MG capsule Take 25 mg by mouth daily.    [provider]  Darbepoetin Alfa (ARANESP) 60 MCG/0.3ML SOSY injection Inject 0.3 mLs (60 mcg total) into the vein every Friday with hemodialysis. 11/18/21   Lonia Blood, MD  doxercalciferol (HECTOROL) 4 MCG/2ML injection Inject 3 mLs (6 mcg total) into the vein every Monday, Wednesday, and Friday with hemodialysis. 11/17/21   Lonia Blood, MD  DULoxetine (CYMBALTA) 20 MG capsule Take 1 capsule (20 mg total) by mouth daily. 02/06/22 05/07/22  Lorin Glass, MD  ferric citrate (AURYXIA) 1 GM 210 MG(Fe) tablet Take 210-630 mg by mouth See admin instructions. Take 3 tablets (630 mg) by mouth three times daily with meals, take 1 tablet (210 mg) with snacks    [provider]  hydrALAZINE (APRESOLINE) 25 MG tablet Take 3 tablets (75 mg total) by mouth every 8 (eight) hours. 02/05/22 05/06/22  Lorin Glass, MD  lidocaine-prilocaine (EMLA) cream Apply 1 application  topically daily as needed (prior to port access). Patient not taking: Reported on 03/07/2022 07/09/20   [provider]  losartan (COZAAR) 100 MG tablet Take 1 tablet (100 mg total) by mouth daily. Patient taking differently: Take 100 mg by mouth 2 (two) times daily. 12/04/21  03/07/22  [place], Alvira Philips, DO  metoCLOPramide (REGLAN) 5 MG tablet Take 5 mg by mouth every 6 (six) hours as needed for nausea or vomiting. Patient not taking: Reported on 03/07/2022    [provider]  mycophenolate (MYFORTIC) 180 MG EC tablet Take 540 mg by mouth 2 (two) times daily.    [provider]  ondansetron (ZOFRAN) 4 MG tablet Take 4 mg by mouth every 8 (eight) hours as needed for nausea or vomiting. Patient not taking:  Reported on 03/07/2022    [provider]  oxyCODONE-acetaminophen (PERCOCET/ROXICET) 5-325 MG tablet Take 1 tablet by mouth 3 (three) times daily. Patient not taking: Reported on 03/07/2022    [provider]  pantoprazole (PROTONIX) 40 MG tablet Take 1 tablet (40 mg total) by mouth daily. 02/06/22 05/07/22  Lorin Glass, MD  predniSONE (DELTASONE) 5 MG tablet Take 15 mg by mouth daily. 06/24/15   [provider]  sodium zirconium cyclosilicate (LOKELMA) 10 g PACK packet Take 10 g by mouth See admin instructions. Take one packet (10 g) by mouth (dissolved in water) daily on Non-Dialysis (Sun,Tues, Thurs, Sat)    [provider]  zolpidem (AMBIEN) 5 MG tablet Take 5 mg by mouth at bedtime.    [provider]    Past Medical History:  Diagnosis Date   Anemia    Anxiety    ESRD (end stage renal disease) (HCC)    Henmodialysis   Gastroparesis    Hypertension    Narcotic abuse (HCC)    Non compliance with medical treatment    Renal disorder 2015   transplant   Vision loss     Past Surgical History:  Procedure Laterality Date   A/V FISTULAGRAM Right 02/17/2021   Procedure: A/V FISTULAGRAM;  Surgeon: Cephus Shelling, MD;  Location: [facility] INVASIVE CV LAB;  Service: Cardiovascular;  Laterality: Right;   AV FISTULA PLACEMENT  11/17/2011   Procedure: ARTERIOVENOUS (AV) FISTULA CREATION;  Surgeon: Larina Earthly, MD;  Location: [facility] OR;  Service: Vascular;  Laterality: Right;   BIOPSY  12/03/2021   Procedure: BIOPSY;  Surgeon: Sherrilyn Rist, MD;  Location: [facility] ENDOSCOPY;  Service: Gastroenterology;;   ESOPHAGOGASTRODUODENOSCOPY (EGD) WITH PROPOFOL N/A 12/03/2021   Procedure: ESOPHAGOGASTRODUODENOSCOPY (EGD) WITH PROPOFOL;  Surgeon: Sherrilyn Rist, MD;  Location: [facility] ENDOSCOPY;  Service: Gastroenterology;  Laterality: N/A;   EYE SURGERY     HEMATOMA EVACUATION Right 02/25/2021   Procedure: EVACUATION HEMATOMA RIGHT CHEST;  Surgeon: Maeola Harman, MD;  Location: [facility] OR;  Service: Vascular;  Laterality: Right;   INSERTION OF DIALYSIS CATHETER Right 12/08/2020   Procedure: INSERTION OF TUNNELED DIALYSIS CATHETER;  Surgeon: Cephus Shelling, MD;  Location: [facility] OR;  Service: Vascular;  Laterality: Right;   IR REMOVAL TUN CV CATH W/O FL  05/03/2021   KIDNEY TRANSPLANT     NEPHRECTOMY TRANSPLANTED ORGAN     pancrease transplant     PERIPHERAL VASCULAR BALLOON ANGIOPLASTY Right 02/17/2021   Procedure: PERIPHERAL VASCULAR BALLOON ANGIOPLASTY;  Surgeon: Cephus Shelling, MD;  Location: [facility] INVASIVE CV LAB;  Service: Cardiovascular;  Laterality: Right;   REFRACTIVE SURGERY     REVISON OF ARTERIOVENOUS FISTULA Right 12/08/2020   Procedure: RIGHT ARM ARTERIOVENOUS FISTULA REVISION AND RESECTION;  Surgeon: Cephus Shelling, MD;  Location: [facility] OR;  Service: Vascular;  Laterality: Right;   REVISON OF ARTERIOVENOUS FISTULA Right 02/25/2021   Procedure: RIGHT ARM ARTERIOVENOUS FISTULA REVISION WITH TRANSPOSITION OF CEPHALIC VEIN ON  AXILLARY VEIN;  Surgeon: Cephus Shelling, MD;  Location: [facility] OR;  Service: Vascular;  Laterality: Right;     reports that she has quit smoking. Her smoking use included pipe. She has never used smokeless tobacco. She reports that she does not drink alcohol and does not use drugs.  Family History  Problem Relation Age of Onset   Hypertension Father      Physical Exam: Vitals:   06/13/23 0530 06/13/23 0545 06/13/23 0600 06/13/23 0615  BP: (!) 171/66 (!) 170/66 (!) 163/77 (!) 158/67  Pulse: 85 80 78 79  Resp:  17 15 18   Temp:  98 F (36.7 C) 98.2 F (36.8 C) 98.1 F (36.7 C)  TempSrc:  Oral Oral Oral  SpO2: 90% 95% 98% 94%    Gen: Awake, alert, NAD  CV: Regular, normal S1, S2, 1/6 SEM, fistula right upper extremity Resp: Normal WOB, diminished in bases, otherwise clear Abd: Flat, normoactive, moderate to severe tenderness in the lower abdomen, voluntary guarding.  No rebound or rigidity MSK:  Symmetric, no edema  Skin: No rashes or lesions to exposed skin  Neuro: Alert and interactive  Psych: euthymic, appropriate    Data review:   Labs reviewed, notable for:   Hemoglobin 5.9 WBC 3.4, platelet 119 Creatinine 7.7, esrd K5.3  Micro:  Results for orders placed or performed during the hospital encounter of 03/06/22  Surgical PCR screen     Status: Abnormal   Collection Time: 03/07/22  9:20 PM   Specimen: Nasal Mucosa; Nasal Swab  Result Value Ref Range Status   MRSA, PCR NEGATIVE NEGATIVE Final   Staphylococcus aureus POSITIVE (A) NEGATIVE Final    Comment: (NOTE) The Xpert SA Assay (FDA approved for NASAL specimens in patients 77 years of age and older), is one component of a comprehensive surveillance program. It is not intended to diagnose infection nor to guide or monitor treatment. Performed at [facility], [address] [place], [name] [address]     Imaging reviewed:  US PELVIC COMPLETE WITH TRANSVAGINAL  Result Date: 06/13/2023 CLINICAL DATA:  Prolonged vaginal bleeding, LMP 05/14/2023 EXAM: TRANSABDOMINAL AND TRANSVAGINAL ULTRASOUND OF PELVIS TECHNIQUE: Both transabdominal and transvaginal ultrasound examinations of the pelvis were performed. Transabdominal technique was performed for global imaging of the pelvis including uterus, ovaries, adnexal regions, and pelvic cul-de-sac. It was necessary to proceed with endovaginal exam following the transabdominal exam to visualize the endometrium and ovaries bilaterally. COMPARISON:  None Available. FINDINGS: Uterus Measurements: 7.0 x 2.7 x 4.5 cm = volume: 44 mL. Uterus anteverted. The cervix is unremarkable. A a rounded heterogeneously hypoechoic solid subserosal/exophytic mass is seen within the right uterine fundus compatible with a uterine fibroid measuring 13 mm in greatest dimension. There are extensive parenchymal calcifications seen within the uterus related to arteriosclerosis better appreciated on  prior CT examination. Endometrium Thickness: 3 mm.  No focal abnormality visualized. Right ovary Not visualized.  No adnexal mass. Left ovary Measurements: 1.9 x 1.6 x 1.9 cm = volume: 3 mL. Normal appearance/no adnexal mass. Other findings No abnormal free fluid. IMPRESSION: 1. 13 mm subserosal/exophytic uterine fibroid. 2. Nonvisualization of the right ovary. No adnexal mass. Electronically Signed   By: Helyn Numbers M.D.   On: 06/13/2023 01:51     ED Course:  Transvaginal ultrasound obtained.  Ordered for 2 unit RBC transfusion   Assessment/Plan:  42 y.o. 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   Symptomatic anemia, acute on chronic, with acute blood loss anemia Chronic thrombocytopenia Dysfunctional uterine bleeding Uterine fibroid Baseline hemoglobin approximately 9-10; history anemia of chronic kidney disease.  On Monday was 6.5 outpatient, had attempted to set her up with an outpatient transfusion but this did not occur before admission.  On admission hemoglobin 5.9.  1 month of heavy menstrual bleeding.  Symptomatic with orthostatic symptoms, syncope.  Urine hCG quant is 3.  Reports not sexually active in past 10 years.  TVUS demonstrating 13 mm uterine fibroid, endometrial lining 3 mm, right ovary not seen.  No adnexal mass or free fluid.  Etiology of her DUB suspected multifactorial with uterine fibroid, uremic platelet dysfunction, thrombocytopenia, question underlying bleeding diathesis (with history of spontaneous SDH).  -Ordered for 2 unit RBC transfusion -Gynecology consulted, recommended starting on Megace twice daily and outpatient gynecology follow-up -Held her home aspirin as I believe this is secondary  prevention for ASCVD with her history of renal transplant. -If her vaginal bleeding is not controlled or has continued drop in hemoglobin/inadequate response likely will need gynecology reconsulted and patient for possible intervention -Check PT, APTT.  May benefit from hematology evaluation for underlying bleeding diathesis such as von Willebrand -Check iron panel, B12, folate, reticulocyte -Pain control: Tylenol as needed for mild, oxycodone 5/ 7.5 mg every 6 hours as needed for moderate/severe  Chronic medical problems: ESRD, hyperkalemia, history of failed renal transplant: Needs nephrology consult in morning for routine HD.  Nephrology to manage her immunosuppressive management for renal transplant.  Of note no recent fills for cyclosporine or mycophenolate, prednisone, but reports compliance.  Continued her last prescribed home dosage for now.  Continue Lokelma, changed to 10 g daily Status post pancreatic transplant: No longer on insulin therapy for previous type 1 diabetes Hypertension: Continue her home losartan, amlodipine, hydralazine.  Reports that she is off of carvedilol but does have a recent fill, held for now Question secondary ASCVD prevention: Hold aspirin   There is no height or weight on file to calculate BMI.    DVT prophylaxis:  SCDs Code Status:  Full Code Diet:  Diet Orders (From admission, onward)     Start     Ordered   06/13/23 0454  Diet renal with fluid restriction Fluid restriction: 1200 mL Fluid; Room service appropriate? Yes; Fluid consistency: Thin  Diet effective now       Question Answer Comment  Fluid restriction: 1200 mL Fluid   Room service appropriate? Yes   Fluid consistency: Thin      06/13/23 0509           Family Communication: No Consults: Gynecology, nephrology in a.m. Admission status:   Inpatient, Telemetry bed  Severity of Illness: The appropriate patient status for this patient is INPATIENT. Inpatient status is judged to be  reasonable and necessary in order to provide the required intensity of service to ensure the patient's safety. The patient's presenting symptoms, physical exam findings, and initial radiographic and laboratory data in the context of their chronic comorbidities is felt to place them at high risk for further clinical deterioration. Furthermore, it is not anticipated that the patient will be medically stable for discharge from the hospital within 2 midnights of admission.   * I certify that at the point of admission it is my clinical judgment that the patient will require inpatient hospital  care spanning beyond 2 midnights from the point of admission due to high intensity of service, high risk for further deterioration and high frequency of surveillance required.*   Dolly Rias, MD [name]  How to contact the [facility] Attending or Consulting provider 7A - 7P or covering provider during after hours 7P -7A, for this patient.  Check the care team in [facility] and look for a) attending/consulting [facility] provider listed and b) the [facility] team listed Log into [email or web address] and use [place]'s universal password to access. If you do not have the password, please contact the hospital operator. Locate the [facility] provider you are looking for under [name] and page to a number that you can be directly reached. If you still have difficulty reaching the provider, please page the [facility] (Director on Call) for the Hospitalists listed on amion for assistance.  06/13/2023, 6:45 AM

## 2023-06-13 NOTE — ED Notes (Signed)
ED TO INPATIENT HANDOFF REPORT  ED Nurse Name and Phone #: Datrell Dunton [phone]  S Name/Age/Gender [name] [name] 42 y.o. female Room/Bed: 038C/038C  Code Status   Code Status: Full Code  Home/SNF/Other Home Patient oriented to: self, place, time, and situation Is this baseline? Yes   Triage Complete: Triage complete  Chief Complaint Symptomatic anemia [D64.9]  Triage Note BIB [facility] from home, vaginal bleeding that has not stopped since a month ago, generalized abdominal pain that worsened today,  Pt had pancreas and kidney transplant in 2015, pancreas are working, kidneys are not, goes to dialysis M,W,F last dialysis Monday.     Allergies No Known Allergies  Level of Care/Admitting Diagnosis ED Disposition     ED Disposition  Admit   Condition  --   Comment  Hospital Area: [facility] [[id number]]  Level of Care: Telemetry Medical [104]  May place patient in observation at [facility] or [facility] if equivalent level of care is available:: No  Covid Evaluation: Asymptomatic - no recent exposure (last 10 days) testing not required  Diagnosis: Symptomatic anemia [2956213]  Admitting Physician: Dolly Rias [[id number]]  Attending Physician: Dolly Rias [[id number]]          B Medical/Surgery History Past Medical History:  Diagnosis Date   Anemia    Anxiety    ESRD (end stage renal disease) (HCC)    Henmodialysis   Gastroparesis    Hypertension    Narcotic abuse (HCC)    Non compliance with medical treatment    Renal disorder 2015   transplant   Vision loss    Past Surgical History:  Procedure Laterality Date   A/V FISTULAGRAM Right 02/17/2021   Procedure: A/V FISTULAGRAM;  Surgeon: Cephus Shelling, MD;  [facility];  Service: Cardiovascular;  Laterality: Right;   AV FISTULA PLACEMENT  11/17/2011   Procedure: ARTERIOVENOUS (AV) FISTULA CREATION;  Surgeon: Larina Earthly, MD;  Location: [facility] OR;  Service:  Vascular;  Laterality: Right;   BIOPSY  12/03/2021   Procedure: BIOPSY;  Surgeon: Sherrilyn Rist, MD;  Location: [facility] ENDOSCOPY;  Service: Gastroenterology;;   ESOPHAGOGASTRODUODENOSCOPY (EGD) WITH PROPOFOL N/A 12/03/2021   Procedure: ESOPHAGOGASTRODUODENOSCOPY (EGD) WITH PROPOFOL;  Surgeon: Sherrilyn Rist, MD;  Location: [facility] ENDOSCOPY;  Service: Gastroenterology;  Laterality: N/A;   EYE SURGERY     HEMATOMA EVACUATION Right 02/25/2021   Procedure: EVACUATION HEMATOMA RIGHT CHEST;  Surgeon: Maeola Harman, MD;  Location: [facility] OR;  Service: Vascular;  Laterality: Right;   INSERTION OF DIALYSIS CATHETER Right 12/08/2020   Procedure: INSERTION OF TUNNELED DIALYSIS CATHETER;  Surgeon: Cephus Shelling, MD;  Location: [facility] OR;  Service: Vascular;  Laterality: Right;   IR REMOVAL TUN CV CATH W/O FL  05/03/2021   KIDNEY TRANSPLANT     NEPHRECTOMY TRANSPLANTED ORGAN     pancrease transplant     PERIPHERAL VASCULAR BALLOON ANGIOPLASTY Right 02/17/2021   Procedure: PERIPHERAL VASCULAR BALLOON ANGIOPLASTY;  Surgeon: Cephus Shelling, MD;  [facility];  Service: Cardiovascular;  Laterality: Right;   REFRACTIVE SURGERY     REVISON OF ARTERIOVENOUS FISTULA Right 12/08/2020   Procedure: RIGHT ARM ARTERIOVENOUS FISTULA REVISION AND RESECTION;  Surgeon: Cephus Shelling, MD;  Location: [facility] OR;  Service: Vascular;  Laterality: Right;   REVISON OF ARTERIOVENOUS FISTULA Right 02/25/2021   Procedure: RIGHT ARM ARTERIOVENOUS FISTULA REVISION WITH TRANSPOSITION OF CEPHALIC VEIN ON AXILLARY VEIN;  Surgeon: Cephus Shelling,  MD;  [facility] OR;  Service: Vascular;  Laterality: Right;     A IV Location/Drains/Wounds Patient Lines/Drains/Airways Status     Active Line/Drains/Airways     Name Placement date Placement time Site Days   Peripheral IV 06/13/23 20 G 1.25" Anterior;Left;Proximal Forearm 06/13/23  0051  Forearm  less than 1   Peripheral IV 06/13/23 22 G Left;Posterior  Hand 06/13/23  0347  Hand  less than 1   Fistula / Graft Right Other (Comment) Hemodialysis catheter --  --  Other (Comment)  --   Fistula / Graft Right Upper arm Arteriovenous fistula 12/08/20  1403  Upper arm  917   Incision (Closed) 12/08/20 Chest Right 12/08/20  1408  -- 917   Incision (Closed) 12/08/20 Arm Right 12/08/20  1408  -- 917   Incision (Closed) 02/25/21 Chest Right 02/25/21  2009  -- 838   Incision (Closed) 05/03/21 Chest Right 05/03/21  1214  -- 771            Intake/Output Last 24 hours No intake or output data in the 24 hours ending 06/13/23 1307  Labs/Imaging Results for orders placed or performed during the hospital encounter of 06/12/23 (from the past 48 hour(s))  Type and screen     Status: None (Preliminary result)   Collection Time: 06/13/23 12:05 AM  Result Value Ref Range   ABO/RH(D) O POS    Antibody Screen NEG    Sample Expiration 06/16/2023,2359    Unit Number [id number]    Blood Component Type RED CELLS,LR    Unit division 00    Status of Unit ISSUED    Transfusion Status OK TO TRANSFUSE    Crossmatch Result      Compatible Performed at [facility], [address], [place], [name] [address]    Unit Number [id number]    Blood Component Type RED CELLS,LR    Unit division 00    Status of Unit ISSUED    Transfusion Status OK TO TRANSFUSE    Crossmatch Result Compatible   CBC with Differential     Status: Abnormal   Collection Time: 06/13/23 12:08 AM  Result Value Ref Range   WBC 3.4 (L) 4.0 - 10.5 K/uL   RBC 1.86 (L) 3.87 - 5.11 MIL/uL   Hemoglobin 5.9 (LL) 12.0 - 15.0 g/dL    Comment: REPEATED TO VERIFY THIS CRITICAL RESULT HAS VERIFIED AND BEEN CALLED TO P.KISER,RN BY GLENDA GANADEN ON 10 09 2024 AT 0043, AND HAS BEEN READ BACK.     HCT 18.6 (L) 36.0 - 46.0 %   MCV 100.0 80.0 - 100.0 fL   MCH 31.7 26.0 - 34.0 pg   MCHC 31.7 30.0 - 36.0 g/dL   RDW 10.2 72.5 - 36.6 %   Platelets 119 (L) 150 - 400 K/uL   nRBC 0.0 0.0 - 0.2  %   Neutrophils Relative % 55 %   Neutro Abs 1.9 1.7 - 7.7 K/uL   Lymphocytes Relative 33 %   Lymphs Abs 1.1 0.7 - 4.0 K/uL   Monocytes Relative 9 %   Monocytes Absolute 0.3 0.1 - 1.0 K/uL   Eosinophils Relative 2 %   Eosinophils Absolute 0.1 0.0 - 0.5 K/uL   Basophils Relative 0 %   Basophils Absolute 0.0 0.0 - 0.1 K/uL   Immature Granulocytes 1 %   Abs Immature Granulocytes 0.02 0.00 - 0.07 K/uL    Comment: Performed at [facility], [address] [place],  [place] [address]  Comprehensive metabolic panel     Status: Abnormal   Collection Time: 06/13/23 12:08 AM  Result Value Ref Range   Sodium 140 135 - 145 mmol/L   Potassium 5.3 (H) 3.5 - 5.1 mmol/L   Chloride 96 (L) 98 - 111 mmol/L   CO2 26 22 - 32 mmol/L   Glucose, Bld 89 70 - 99 mg/dL    Comment: Glucose reference range applies only to samples taken after fasting for at least 8 hours.   BUN 41 (H) 6 - 20 mg/dL   Creatinine, Ser 6.04 (H) 0.44 - 1.00 mg/dL   Calcium 9.2 8.9 - 54.0 mg/dL   Total Protein 6.8 6.5 - 8.1 g/dL   Albumin 3.3 (L) 3.5 - 5.0 g/dL   AST 18 15 - 41 U/L   ALT 18 0 - 44 U/L   Alkaline Phosphatase 62 38 - 126 U/L   Total Bilirubin 0.5 0.3 - 1.2 mg/dL   GFR, Estimated 6 (L) >60 mL/min    Comment: (NOTE) Calculated using the CKD-EPI Creatinine Equation (2021)    Anion gap 18 (H) 5 - 15    Comment: Performed at [facility], [address] [place], [name] [address]  hCG, quantitative, pregnancy     Status: None   Collection Time: 06/13/23 12:08 AM  Result Value Ref Range   hCG, Beta Chain, Quant, S 3 <5 mIU/mL    Comment:          GEST. AGE      CONC.  (mIU/mL)   <=1 WEEK        5 - 50     2 WEEKS       50 - 500     3 WEEKS       100 - 10,000     4 WEEKS     1,000 - 30,000     5 WEEKS     3,500 - 115,000   6-8 WEEKS     12,000 - 270,000    12 WEEKS     15,000 - 220,000        FEMALE AND NON-PREGNANT FEMALE:     LESS THAN 5 mIU/mL Performed at [facility], [address], [place], [name] [address]   Prepare RBC (crossmatch)     Status: None   Collection Time: 06/13/23 12:57 AM  Result Value Ref Range   Order Confirmation      ORDER PROCESSED BY BLOOD BANK Performed at [facility], [address], [place], [name] [address]    US PELVIC COMPLETE WITH TRANSVAGINAL  Result Date: 06/13/2023 CLINICAL DATA:  Prolonged vaginal bleeding, LMP 05/14/2023 EXAM: TRANSABDOMINAL AND TRANSVAGINAL ULTRASOUND OF PELVIS TECHNIQUE: Both transabdominal and transvaginal ultrasound examinations of the pelvis were performed. Transabdominal technique was performed for global imaging of the pelvis including uterus, ovaries, adnexal regions, and pelvic cul-de-sac. It was necessary to proceed with endovaginal exam following the transabdominal exam to visualize the endometrium and ovaries bilaterally. COMPARISON:  None Available. FINDINGS: Uterus Measurements: 7.0 x 2.7 x 4.5 cm = volume: 44 mL. Uterus anteverted. The cervix is unremarkable. A a rounded heterogeneously hypoechoic solid subserosal/exophytic mass is seen within the right uterine fundus compatible with a uterine fibroid measuring 13 mm in greatest dimension. There are extensive parenchymal calcifications seen within the uterus related to arteriosclerosis better appreciated on prior CT examination. Endometrium Thickness: 3 mm.  No focal abnormality visualized. Right ovary Not visualized.  No adnexal mass. Left ovary Measurements: 1.9 x 1.6 x 1.9 cm = volume: 3 mL. Normal appearance/no adnexal mass. Other findings No abnormal free fluid. IMPRESSION: 1. 13 mm subserosal/exophytic uterine fibroid. 2. Nonvisualization of the right ovary. No adnexal mass. Electronically Signed   By: Helyn Numbers M.D.   On: 06/13/2023 01:51    Pending Labs Unresulted Labs (From admission, onward)     Start     Ordered   06/13/23 0600  Protime-INR  Once,   R        06/13/23 0341   06/13/23 0600  APTT  Once,   R        06/13/23 0341    06/13/23 0600  CBC  Once,   R       Comments: Draw post transfusion    06/13/23 0342   06/13/23 0600  HIV Antibody (routine testing w rflx)  (HIV Antibody (Routine testing w reflex) panel)  Once,   R        06/13/23 0509   06/13/23 0600  Ferritin  Once,   R        06/13/23 0511   06/13/23 0600  Folate  Once,   R        06/13/23 0511   06/13/23 0600  Vitamin B12  Once,   R        06/13/23 0511   06/13/23 0600  Transferrin  Once,   R        06/13/23 0511   06/13/23 0600  Iron and TIBC  Once,   R        06/13/23 0511   06/13/23 0600  Reticulocytes  Once,   R        06/13/23 0511            Vitals/Pain Today's Vitals   06/13/23 0900 06/13/23 0915 06/13/23 0931 06/13/23 0945  BP: (!) 149/72 (!) 151/72 (!) 148/66 (!) 160/62  Pulse: 77 78 83 80  Resp: 16 13 (!) 23 15  Temp:   98.4 F (36.9 C)   TempSrc:   Oral   SpO2: 94% 92% 96% 93%  PainSc:        Isolation Precautions No active isolations  Medications Medications  oxyCODONE (Oxy IR/ROXICODONE) immediate release tablet 5 mg ( Oral See Alternative 06/13/23 0758)    Or  oxyCODONE (Oxy IR/ROXICODONE) immediate release tablet 7.5 mg (7.5 mg Oral Given 06/13/23 0758)  amLODipine (NORVASC) tablet 10 mg (has no administration in time range)  hydrALAZINE (APRESOLINE) tablet 50 mg (50 mg Oral Given 06/13/23 0619)  losartan (COZAAR) tablet 100 mg (100 mg Oral Given 06/13/23 0941)  predniSONE (DELTASONE) tablet 15 mg (15 mg Oral Given 06/13/23 0938)  cycloSPORINE modified (NEORAL) capsule 200 mg (200 mg Oral Given 06/13/23 0939)  mycophenolate (MYFORTIC) EC tablet 540 mg (540 mg Oral Given 06/13/23 0940)  sodium zirconium cyclosilicate (LOKELMA) packet 10 g (10 g Oral Given 06/13/23 0942)  sodium chloride flush (NS) 0.9 % injection 3 mL (0 mLs Intravenous Hold 06/13/23 0820)  acetaminophen (TYLENOL) tablet 1,000 mg (has no administration in time range)  ferric citrate (AURYXIA) tablet 630 mg (630 mg Oral Given 06/13/23 1153)  ferric  citrate (AURYXIA) tablet 210 mg (has no administration in time range)  HYDROmorphone (DILAUDID) injection 1 mg (1 mg Intravenous Given 06/13/23 0051)  0.9 %  sodium chloride infusion (Manually program via Guardrails IV Fluids) (0 mLs Intravenous Stopped 06/13/23 0932)  megestrol (MEGACE) tablet 40 mg (40 mg  Oral Given 06/13/23 0558)  HYDROmorphone (DILAUDID) injection 1 mg (1 mg Intravenous Given 06/13/23 0354)    Mobility walks     Focused Assessments    R Recommendations: See Admitting Provider Note  Report given to:   Additional Notes:

## 2023-06-13 NOTE — Consult Note (Signed)
Renal Service Consult Note [facility]  [name] [name] 06/13/2023 [name], MD Requesting Physician: Dr. Nelson Chimes  Reason for Consult: ESRD pt w/ c/o  HPI: The patient is a 42 y.o. year-old w/ PMH as below who presented w/ heavy vaginal bleeding for 1 month. Dialysis labs showed Hb 6.5 from Monday. Pt having symptoms such as lightheadedness. Usual Hb 9-10. Pt admitted w/ dx of uterine bleeding w/ fibroids, dysfunctional. 2 units prbc's were ordered. Consider GYN consult. Chest PT/ PTT, iron stores. Has chronic low plts 100- 150 range. Pt admitted. Asked to see for dialysis.   H/o KP transplant, kidney failed but pancreas is still working on myfortic, cyclosporine. No SOB, CP.  Dialysis is going well at [facility].   ROS - denies CP, no joint pain, no HA, no blurry vision, no rash, no diarrhea, no nausea/ vomiting, no dysuria, no difficulty voiding   Past Medical History  Past Medical History:  Diagnosis Date   Anemia    Anxiety    ESRD (end stage renal disease) (HCC)    Henmodialysis   Gastroparesis    Hypertension    Narcotic abuse (HCC)    Non compliance with medical treatment    Renal disorder 2015   transplant   Vision loss    Past Surgical History  Past Surgical History:  Procedure Laterality Date   A/V FISTULAGRAM Right 02/17/2021   Procedure: A/V FISTULAGRAM;  Surgeon: Cephus Shelling, MD;  [facility] INVAS[facility] CV LAB;  Service: Cardiovascular;  Laterality: Right;   AV FISTULA PLACEMENT  11/17/2011   Procedure: ARTERIOVENOUS (AV) FISTULA CREATION;  Surgeon: Larina Earthly, MD;  Location: [facility] OR;  Service: Vascular;  Laterality: Right;   BIOPSY  12/03/2021   Procedure: BIOPSY;  Surgeon: Sherrilyn Rist, MD;  Location: [facility] ENDOSCOPY;  Service: Gastroenterology;;   ESOPHAGOGASTRODUODENOSCOPY (EGD) WITH PROPOFOL N/A 12/03/2021   Procedure: ESOPHAGOGASTRODUODENOSCOPY (EGD) WITH PROPOFOL;  Surgeon: Sherrilyn Rist, MD;  Location: [facility] ENDOSCOPY;   Service: Gastroenterology;  Laterality: N/A;   EYE SURGERY     HEMATOMA EVACUATION Right 02/25/2021   Procedure: EVACUATION HEMATOMA RIGHT CHEST;  Surgeon: Maeola Harman, MD;  Location: [facility] OR;  Service: Vascular;  Laterality: Right;   INSERTION OF DIALYSIS CATHETER Right 12/08/2020   Procedure: INSERTION OF TUNNELED DIALYSIS CATHETER;  Surgeon: Cephus Shelling, MD;  Location[facility] OR;  Service: Vascular;  Laterality: Right;   IR REMOVAL TUN CV CATH W/O FL  05/03/2021   KIDNEY TRANSPLANT     NEPHRECTOMY TRANSPLANTED ORGAN     pancrease transplant     PERIPHERAL VASCULAR BALLOON ANGIOPLASTY Right 02/17/2021   Procedure: PERIPHERAL VASCULAR BALLOON ANGIOPLASTY;  Surgeon: Cephus Shelling, MD;  [facility];  Service: Cardiovascular;  Laterality: Right;   REFRACTIVE SURGERY     REVISON OF ARTERIOVENOUS FISTULA Right 12/08/2020   Procedure: RIGHT ARM ARTERIOVENOUS FISTULA REVISION AND RESECTION;  Surgeon: Cephus Shelling, MD;  Location: [facility] OR;  Service: Vascular;  Laterality: Right;   REVISON OF ARTERIOVENOUS FISTULA Right 02/25/2021   Procedure: RIGHT ARM ARTERIOVENOUS FISTULA REVISION WITH TRANSPOSITION OF CEPHALIC VEIN ON AXILLARY VEIN;  Surgeon: Cephus Shelling, MD;  [facility] OR;  Service: Vascular;  Laterality: Right;   Family History  Family History  Problem Relation Age of Onset   Hypertension Father    Social History  reports that she has quit smoking. Her smoking use included pipe. She has never used smokeless tobacco.  She reports that she does not drink alcohol and does not use drugs. Allergies No Known Allergies Home medications Prior to Admission medications   Medication Sig Start Date End Date Taking? Authorizing Provider  amLODipine (NORVASC) 10 MG tablet Take 1 tablet (10 mg total) by mouth daily. Patient taking differently: Take 10 mg by mouth at bedtime. 02/05/22 06/13/23 Yes Dahal, Melina Schools, MD  aspirin EC 81 MG tablet Chew 81 mg by mouth  daily.   Yes [provider]  carvedilol (COREG) 12.5 MG tablet Take 1 tablet (12.5 mg total) by mouth 2 (two) times daily with a meal. 02/05/22 06/13/23 Yes Dahal, Melina Schools, MD  cycloSPORINE modified (NEORAL) 100 MG capsule Take 2 capsules (200 mg total) by mouth 2 (two) times daily. 12/25/21  Yes Pokhrel, Laxman, MD  cycloSPORINE modified (NEORAL) 25 MG capsule Take 25 mg by mouth daily.   Yes [provider]  Darbepoetin Alfa (ARANESP) 60 MCG/0.3ML SOSY injection Inject 0.3 mLs (60 mcg total) into the vein every Friday with hemodialysis. 11/18/21  Yes Lonia Blood, MD  doxercalciferol (HECTOROL) 4 MCG/2ML injection Inject 3 mLs (6 mcg total) into the vein every Monday, Wednesday, and Friday with hemodialysis. 11/17/21  Yes Lonia Blood, MD  ferric citrate (AURYXIA) 1 GM 210 MG(Fe) tablet Take 210-630 mg by mouth See admin instructions. Take 3 tablets (630 mg) by mouth three times daily with meals, take 1 tablet (210 mg) with snacks   Yes [provider]  hydrALAZINE (APRESOLINE) 25 MG tablet Take 3 tablets (75 mg total) by mouth every 8 (eight) hours. 02/05/22 06/13/23 Yes Dahal, Melina Schools, MD  hydrOXYzine (VISTARIL) 25 MG capsule Take 25 mg by mouth 3 (three) times daily as needed for anxiety. 05/26/23  Yes [provider]  lidocaine-prilocaine (EMLA) cream Apply 1 application  topically daily as needed (prior to port access). 07/09/20  Yes [provider]  losartan (COZAAR) 100 MG tablet Take 1 tablet (100 mg total) by mouth daily. 12/04/21 06/13/23 Yes [place], Eric J, DO  metoCLOPramide (REGLAN) 5 MG tablet Take 5 mg by mouth every 6 (six) hours as needed for nausea or vomiting.   Yes [provider]  mycophenolate (MYFORTIC) 180 MG EC tablet Take 540 mg by mouth 2 (two) times daily.   Yes [provider]  ondansetron (ZOFRAN) 4 MG tablet Take 4 mg by mouth every 8 (eight) hours as needed for nausea or vomiting.   Yes [provider]  oxyCODONE-acetaminophen (PERCOCET/ROXICET) 5-325 MG tablet Take 1 tablet by mouth 3 (three) times daily.   Yes [provider]  predniSONE (DELTASONE) 5 MG tablet Take 15 mg by mouth daily. 06/24/15  Yes [provider]  sodium zirconium cyclosilicate (LOKELMA) 10 g PACK packet Take 10 g by mouth See admin instructions. Take one packet (10 g) by mouth (dissolved in water) daily on Non-Dialysis (Sun,Tues, Thurs, Sat)   Yes [provider]  zolpidem (AMBIEN) 5 MG tablet Take 5 mg by mouth at bedtime.   Yes [provider]  DULoxetine (CYMBALTA) 20 MG capsule Take 1 capsule (20 mg total) by mouth daily. Patient not taking: Reported on 06/13/2023 02/06/22 05/07/22  Lorin Glass, MD  pantoprazole (PROTONIX) 40 MG tablet Take 1 tablet (40 mg total) by mouth daily. 02/06/22 05/07/22  Lorin Glass, MD     Vitals:   06/13/23 0931 06/13/23 0945 06/13/23 1400 06/13/23 1410  BP: (!) 148/66 (!) 160/62 (!) 171/72 (!) 171/72  Pulse: 83 80  82  Resp: (!) 23 15  19   Temp: 98.4 F (36.9 C)   98.4 F (36.9 C)  TempSrc: Oral   Oral  SpO2: 96% 93%  100%   Exam Gen alert, no distress No rash, cyanosis or gangrene Sclera anicteric, throat clear  No jvd or bruits Chest clear bilat to bases, no rales/ wheezing RRR no MRG Abd soft ntnd no mass or ascites +bs GU defer MS no joint effusions or deformity Ext no LE or UE edema, no wounds or ulcers Neuro is alert, Ox 3 , nf    RUA AVF+ bruit     Renal-related home meds: - norvasc 10 - coreg 12.5 bid - cyclosporine 200 bid + 25mg  daily - auryxia 3 ac - hydralazine 50 tid - losartan 100 every day - myfortic 540 bid - lokelma 10 gm daily    OP HD: SW MWF 3h   400/500  59kg  2/2 bath   AVF   Heparin none - last OP HD 10/07, post wt 61.kg (2.5 kg up) - mircera 100 mcg q 2, last 10/07, due 10/21  - hectorol 4 mcg - sensipar 90 mg     VS 160/ 75  HR 78  RR 15  afeb   RA 93%    K+ 5.3  BUN 41  creat 7.7  alb  3.3  Hb 5.9  Hct 18  WBC 3K     Assessment/ Plan: Anemia/ vaginal bleeding - non-menstrual bleeding, +uterine fibroids. Per pmd.  ESRD - on HD MWF. Hd today or tonight.  HTN/ BP - BP's high normal. Cont home bp lowering meds.  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 Cont IS regimen w/ cyclosporine and myfortic.  Volume - no vol excess. Get wts.  Anemia esrd + abl from #1 - Hb 5s, sp 2u prbc's. Next esa due on 10/21. Follow.  MBD ckd - CCa in range, add on phos. Cont binder, hectorol and sensipar.       Vinson Moselle  MD [facility] 06/13/2023, 2:18 PM  Recent Labs  Lab 06/13/23 0008  HGB 5.9*  ALBUMIN 3.3*  CALCIUM 9.2  CREATININE 7.70*  K 5.3*   Inpatient medications:  amLODipine  10 mg Oral QHS   cycloSPORINE modified  200 mg Oral BID   ferric citrate  630 mg Oral TID WC   hydrALAZINE  50 mg Oral Q8H   losartan  100 mg Oral Daily   megestrol  20 mg Oral BID   mycophenolate  540 mg Oral BID   predniSONE  15 mg Oral Daily   sodium chloride flush  3 mL Intravenous Q12H   sodium zirconium cyclosilicate  10 g Oral Daily    acetaminophen, ferric citrate, oxyCODONE **OR** oxyCODONE

## 2023-06-13 NOTE — Consult Note (Signed)
   OB/GYN Telephone Consult  06/13/2023   [name] [name] is a 42 y.o. No obstetric history on file. who is currently not pregnant presenting to [facility].   I was called for a consult regarding the care of this patient by the Physician (MD/DO) caring for the patient.   The provider had the following clinical question: Treatment of DUB and uterine fibroid  The provider presented the following relevant clinical information: Pt has end stage renal disease with dialysis three times a week.  One month history of vaginal bleeding and small uterine fibroid.  I performed a chart review on the patient and reviewed available documentation.  BP (!) 169/73   Pulse 81   Temp 98.2 F (36.8 C) (Oral)   Resp (!) 22   SpO2 100%   Exam- performed by consulting provider   Recommendations:  -PO megace 40 mg BID to decrease bleeding until seen as an outpatient -Fibroid is unlikely to be the cause of the bleeding -DUB may contribute to current anemia, but chronic kidney disease may contribute to issues with hemoglobin production.  -Recommended MD/APP provide the patient with a referral to the [facility] (any office) for follow up in  4 weeks.   Thank you for this consult and if additional recommendations are needed please call [phone] for the OB/GYN attending on service at [facility].   I spent approximately 5 minutes directly consulting with the provider and verbally discussing this case. Additionally 5 minutes minutes was spent performing chart review and documentation.    Warden Fillers, MD   Criteria for phone consult billing? (If answer to any of these are yes then you cannot bill this telephone consult) Will the patient be seen urgently (within 24hrs) at a [facility] practice? No Is this a patient on which I performed surgery within the last 7d? No Have you billed a telephone consult on this patient in the last 7d? No   send message to [facility] Billing  and Coding Pool to assure this is billed appropriately  CPT Code (based on total time, direct and indirect) Interprofessional Telephone/ Internet/ Electronic Health Record consultations:   99446 (5-33minutes) 99447 (11-12 minutes) 99448 (21-30 minutes), 99449 (31 minutes or more)

## 2023-06-13 NOTE — ED Provider Notes (Signed)
[place] EMERGENCY DEPARTMENT AT [facility] Provider Note   CSN: [id number] Arrival date & time: 06/12/23  2239     History  Chief Complaint  Patient presents with   Abdominal Pain    [name] [name] is a 42 y.o. female.  Patient states she started her period last month and has been having the vaginal bleeding ever since that time.  No trauma.  Does not think she is pregnant.  She went to dialysis on Monday they told her hemoglobin was low and need to come here for transfusion.  She has pelvic pain that radiates up towards her chest.  That is been pretty consistent.  Has tried oxycodone at home which does not seem to help.  No fevers.  No nausea or vomiting.  No diarrhea.  No history of the same.   Abdominal Pain      Home Medications Prior to Admission medications   Medication Sig Start Date End Date Taking? Authorizing Provider  amLODipine (NORVASC) 10 MG tablet Take 1 tablet (10 mg total) by mouth daily. Patient taking differently: Take 10 mg by mouth at bedtime. 02/05/22 05/06/22  Lorin Glass, MD  aspirin EC 81 MG tablet Chew 81 mg by mouth daily.    [provider]  carvedilol (COREG) 12.5 MG tablet Take 1 tablet (12.5 mg total) by mouth 2 (two) times daily with a meal. Patient not taking: Reported on 02/23/2022 02/05/22 05/06/22  Lorin Glass, MD  cycloSPORINE modified (NEORAL) 100 MG capsule Take 2 capsules (200 mg total) by mouth 2 (two) times daily. 12/25/21   Pokhrel, Rebekah Chesterfield, MD  cycloSPORINE modified (NEORAL) 25 MG capsule Take 25 mg by mouth daily.    [provider]  Darbepoetin Alfa (ARANESP) 60 MCG/0.3ML SOSY injection Inject 0.3 mLs (60 mcg total) into the vein every Friday with hemodialysis. 11/18/21   Lonia Blood, MD  doxercalciferol (HECTOROL) 4 MCG/2ML injection Inject 3 mLs (6 mcg total) into the vein every Monday, Wednesday, and Friday with hemodialysis. 11/17/21   Lonia Blood, MD  DULoxetine (CYMBALTA) 20 MG capsule  Take 1 capsule (20 mg total) by mouth daily. 02/06/22 05/07/22  Lorin Glass, MD  ferric citrate (AURYXIA) 1 GM 210 MG(Fe) tablet Take 210-630 mg by mouth See admin instructions. Take 3 tablets (630 mg) by mouth three times daily with meals, take 1 tablet (210 mg) with snacks    [provider]  hydrALAZINE (APRESOLINE) 25 MG tablet Take 3 tablets (75 mg total) by mouth every 8 (eight) hours. 02/05/22 05/06/22  Lorin Glass, MD  lidocaine-prilocaine (EMLA) cream Apply 1 application  topically daily as needed (prior to port access). Patient not taking: Reported on 03/07/2022 07/09/20   [provider]  losartan (COZAAR) 100 MG tablet Take 1 tablet (100 mg total) by mouth daily. Patient taking differently: Take 100 mg by mouth 2 (two) times daily. 12/04/21 03/07/22  [place], Alvira Philips, DO  metoCLOPramide (REGLAN) 5 MG tablet Take 5 mg by mouth every 6 (six) hours as needed for nausea or vomiting. Patient not taking: Reported on 03/07/2022    [provider]  mycophenolate (MYFORTIC) 180 MG EC tablet Take 540 mg by mouth 2 (two) times daily.    [provider]  ondansetron (ZOFRAN) 4 MG tablet Take 4 mg by mouth every 8 (eight) hours as needed for nausea or vomiting. Patient not taking: Reported on 03/07/2022    [provider]  oxyCODONE-acetaminophen (PERCOCET/ROXICET) 5-325 MG tablet Take 1 tablet  by mouth 3 (three) times daily. Patient not taking: Reported on 03/07/2022    [provider]  pantoprazole (PROTONIX) 40 MG tablet Take 1 tablet (40 mg total) by mouth daily. 02/06/22 05/07/22  Lorin Glass, MD  predniSONE (DELTASONE) 5 MG tablet Take 15 mg by mouth daily. 06/24/15   [provider]  sodium zirconium cyclosilicate (LOKELMA) 10 g PACK packet Take 10 g by mouth See admin instructions. Take one packet (10 g) by mouth (dissolved in water) daily on Non-Dialysis (Sun,Tues, Thurs, Sat)    [provider]  zolpidem (AMBIEN) 5 MG tablet Take 5 mg by  mouth at bedtime.    [provider]      Allergies    Patient has no known allergies.    Review of Systems   Review of Systems  Gastrointestinal:  Positive for abdominal pain.    Physical Exam Updated Vital Signs BP (!) 179/83   Pulse 87   Temp 98.2 F (36.8 C) (Oral)   Resp 19   SpO2 100%  Physical Exam Vitals and nursing note reviewed.  Constitutional:      Appearance: She is well-developed.  HENT:     Head: Normocephalic and atraumatic.  Eyes:     Comments: Pale conjunctiva  Cardiovascular:     Rate and Rhythm: Normal rate and regular rhythm.  Pulmonary:     Effort: No respiratory distress.     Breath sounds: No stridor.  Abdominal:     General: There is no distension.  Musculoskeletal:     Cervical back: Normal range of motion.  Neurological:     Mental Status: She is alert.     ED Results / Procedures / Treatments   Labs (all labs ordered are listed, but only abnormal results are displayed) Labs Reviewed  CBC WITH DIFFERENTIAL/PLATELET - Abnormal; Notable for the following components:      Result Value   WBC 3.4 (*)    RBC 1.86 (*)    Hemoglobin 5.9 (*)    HCT 18.6 (*)    Platelets 119 (*)    All other components within normal limits  COMPREHENSIVE METABOLIC PANEL - Abnormal; Notable for the following components:   Potassium 5.3 (*)    Chloride 96 (*)    BUN 41 (*)    Creatinine, Ser 7.70 (*)    Albumin 3.3 (*)    GFR, Estimated 6 (*)    Anion gap 18 (*)    All other components within normal limits  HCG, QUANTITATIVE, PREGNANCY  TYPE AND SCREEN  PREPARE RBC (CROSSMATCH)    EKG None  Radiology US PELVIC COMPLETE WITH TRANSVAGINAL  Result Date: 06/13/2023 CLINICAL DATA:  Prolonged vaginal bleeding, LMP 05/14/2023 EXAM: TRANSABDOMINAL AND TRANSVAGINAL ULTRASOUND OF PELVIS TECHNIQUE: Both transabdominal and transvaginal ultrasound examinations of the pelvis were performed. Transabdominal technique was performed for global imaging of  the pelvis including uterus, ovaries, adnexal regions, and pelvic cul-de-sac. It was necessary to proceed with endovaginal exam following the transabdominal exam to visualize the endometrium and ovaries bilaterally. COMPARISON:  None Available. FINDINGS: Uterus Measurements: 7.0 x 2.7 x 4.5 cm = volume: 44 mL. Uterus anteverted. The cervix is unremarkable. A a rounded heterogeneously hypoechoic solid subserosal/exophytic mass is seen within the right uterine fundus compatible with a uterine fibroid measuring 13 mm in greatest dimension. There are extensive parenchymal calcifications seen within the uterus related to arteriosclerosis better appreciated on prior CT examination. Endometrium Thickness: 3 mm.  No focal abnormality visualized. Right ovary  Not visualized.  No adnexal mass. Left ovary Measurements: 1.9 x 1.6 x 1.9 cm = volume: 3 mL. Normal appearance/no adnexal mass. Other findings No abnormal free fluid. IMPRESSION: 1. 13 mm subserosal/exophytic uterine fibroid. 2. Nonvisualization of the right ovary. No adnexal mass. Electronically Signed   By: Helyn Numbers M.D.   On: 06/13/2023 01:51    Procedures .Critical Care  Performed by: Marily Memos, MD Authorized by: Marily Memos, MD   Critical care provider statement:    Critical care time (minutes):  30   Critical care was necessary to treat or prevent imminent or life-threatening deterioration of the following conditions:  Circulatory failure and renal failure   Critical care was time spent personally by me on the following activities:  Development of treatment plan with patient or surrogate, discussions with consultants, evaluation of patient's response to treatment, examination of patient, ordering and review of laboratory studies, ordering and review of radiographic studies, ordering and performing treatments and interventions, pulse oximetry, re-evaluation of patient's condition and review of old charts     Medications Ordered in  ED Medications  0.9 %  sodium chloride infusion (Manually program via Guardrails IV Fluids) (has no administration in time range)  megestrol (MEGACE) tablet 40 mg (has no administration in time range)  HYDROmorphone (DILAUDID) injection 1 mg (1 mg Intravenous Given 06/13/23 0051)    ED Course/ Medical Decision Making/ A&P                                 Medical Decision Making Amount and/or Complexity of Data Reviewed Labs: ordered. Radiology: ordered.  Risk Prescription drug management. Decision regarding hospitalization.  Patient's hemoglobin is 5.9.  Consents to blood transfusion.  Also get ultrasound to evaluate possible causes for the vaginal bleeding.  Will discuss with gynecology after ultrasound results as far as consult/management and then probably discussed with the hospitalist for admission since she does need dialysis later today and has multiple other medical problems. D/w Dr. Donavan Foil with gynecology regarding [place] results and feels the fibroid is unlikely related to bleeding and recommends megace 40 BID and medicine admit. He requests formal consult in AM and they will follow along for the vaginal bleeding.  D/w Dr. Lazarus Salines for admission.     Final Clinical Impression(s) / ED Diagnoses Final diagnoses:  Vaginal bleeding  Anemia, unspecified type  ESRD (end stage renal disease) (HCC)    Rx / DC Orders ED Discharge Orders     None         Kayman Snuffer, Barbara Cower, MD 06/13/23 [phone]

## 2023-06-13 NOTE — Progress Notes (Signed)
  Patient's bedside nurse reported that lower abdominal pain is not improving with oxycodone as needed.  Ordered morphine 1 mg every 3 hour as needed for severe and breakthrough pain. -Patient has been admitted earlier today for evaluation for vaginal bleeding found to have uterine fibroid.  Received unit of blood transfusion hemoglobin has been improved to 5.9 to 8.4.  Hemodynamically stable. Patient going for hemodialysis tonight.  Will follow-up with CBC and CMP in the morning.   Tereasa Coop, MD [name] 06/13/2023, 9:07 PM

## 2023-06-13 NOTE — ED Notes (Signed)
Pt receiving blood cannot collect morning labs at this time

## 2023-06-13 NOTE — ED Notes (Signed)
Patient verbally told nurse that she has went through 3 feminine pads since 12pm. She states that when she went to the restroom she was having a lot of bleeding still that was dark in color and noted a lot of clots still. Clots varied in size. She states that it was painful. Updated Dr Nelson Chimes via [facility] chat to make her aware. Patient transferred upstairs to admitting room.

## 2023-06-14 DIAGNOSIS — N939 Abnormal uterine and vaginal bleeding, unspecified: Secondary | ICD-10-CM | POA: Diagnosis not present

## 2023-06-14 DIAGNOSIS — D631 Anemia in chronic kidney disease: Secondary | ICD-10-CM | POA: Diagnosis not present

## 2023-06-14 DIAGNOSIS — Z992 Dependence on renal dialysis: Secondary | ICD-10-CM | POA: Diagnosis not present

## 2023-06-14 DIAGNOSIS — D649 Anemia, unspecified: Secondary | ICD-10-CM | POA: Diagnosis not present

## 2023-06-14 DIAGNOSIS — N186 End stage renal disease: Secondary | ICD-10-CM | POA: Diagnosis not present

## 2023-06-14 LAB — CBC
HCT: 25.7 % — ABNORMAL LOW (ref 36.0–46.0)
HCT: 28.2 % — ABNORMAL LOW (ref 36.0–46.0)
Hemoglobin: 8.4 g/dL — ABNORMAL LOW (ref 12.0–15.0)
Hemoglobin: 9.3 g/dL — ABNORMAL LOW (ref 12.0–15.0)
MCH: 30.7 pg (ref 26.0–34.0)
MCH: 30.4 pg (ref 26.0–34.0)
MCHC: 32.7 g/dL (ref 30.0–36.0)
MCHC: 33 g/dL (ref 30.0–36.0)
MCV: 93.8 fL (ref 80.0–100.0)
MCV: 92.2 fL (ref 80.0–100.0)
Platelets: 115 10*3/uL — ABNORMAL LOW (ref 150–400)
Platelets: 130 10*3/uL — ABNORMAL LOW (ref 150–400)
RBC: 2.74 MIL/uL — ABNORMAL LOW (ref 3.87–5.11)
RBC: 3.06 MIL/uL — ABNORMAL LOW (ref 3.87–5.11)
RDW: 18 % — ABNORMAL HIGH (ref 11.5–15.5)
RDW: 18.1 % — ABNORMAL HIGH (ref 11.5–15.5)
WBC: 4 10*3/uL (ref 4.0–10.5)
WBC: 4.5 10*3/uL (ref 4.0–10.5)
nRBC: 0 % (ref 0.0–0.2)
nRBC: 0 % (ref 0.0–0.2)

## 2023-06-14 LAB — COMPREHENSIVE METABOLIC PANEL
ALT: 16 U/L (ref 0–44)
AST: 15 U/L (ref 15–41)
Albumin: 3.2 g/dL — ABNORMAL LOW (ref 3.5–5.0)
Alkaline Phosphatase: 67 U/L (ref 38–126)
Anion gap: 14 (ref 5–15)
BUN: 17 mg/dL (ref 6–20)
CO2: 32 mmol/L (ref 22–32)
Calcium: 9.1 mg/dL (ref 8.9–10.3)
Chloride: 92 mmol/L — ABNORMAL LOW (ref 98–111)
Creatinine, Ser: 4.79 mg/dL — ABNORMAL HIGH (ref 0.44–1.00)
GFR, Estimated: 11 mL/min — ABNORMAL LOW (ref >60)
Glucose, Bld: 96 mg/dL (ref 70–99)
Potassium: 3.5 mmol/L (ref 3.5–5.1)
Sodium: 138 mmol/L (ref 135–145)
Total Bilirubin: 0.3 mg/dL (ref 0.3–1.2)
Total Protein: 6.7 g/dL (ref 6.5–8.1)

## 2023-06-14 LAB — TYPE AND SCREEN
ABO/RH(D): O POS
Antibody Screen: NEGATIVE
Unit division: 0
Unit division: 0

## 2023-06-14 LAB — BPAM RBC
Blood Product Expiration Date: 202410312359
Blood Product Expiration Date: 202411012359
ISSUE DATE / TIME: 202410090144
ISSUE DATE / TIME: 202410090541
Unit Type and Rh: 5100
Unit Type and Rh: 5100

## 2023-06-14 LAB — HEPATITIS B SURFACE ANTIBODY, QUANTITATIVE: Hep B S AB Quant (Post): 237 m[IU]/mL

## 2023-06-14 MED ORDER — ORAL CARE MOUTH RINSE: 15.0000 mL | OROMUCOSAL | Status: DC | PRN

## 2023-06-14 MED ORDER — OXYCODONE HCL 5 MG PO TABS: 7.5000 mg | ORAL_TABLET | Freq: Four times a day (QID) | ORAL | Status: DC | PRN

## 2023-06-14 MED ORDER — OXYCODONE HCL 5 MG PO TABS
10.0000 mg | ORAL_TABLET | Freq: Four times a day (QID) | ORAL | Status: DC | PRN
  Administered 2023-06-14: 10 mg via ORAL
  Filled 2023-06-14: qty 2

## 2023-06-14 MED ORDER — MEGESTROL ACETATE 40 MG PO TABS
40.0000 mg | ORAL_TABLET | Freq: Two times a day (BID) | ORAL | Status: DC
  Administered 2023-06-14: 40 mg via ORAL
  Filled 2023-06-14 (×2): qty 1

## 2023-06-14 MED ORDER — MEGESTROL ACETATE 40 MG PO TABS: 40.0000 mg | ORAL_TABLET | Freq: Two times a day (BID) | ORAL | 0 refills | Status: AC

## 2023-06-14 MED ORDER — CINACALCET HCL 30 MG PO TABS: 90.0000 mg | ORAL_TABLET | Freq: Every day | ORAL | 1 refills | Status: DC

## 2023-06-14 NOTE — Progress Notes (Signed)
D/C order noted. Contacted [facility] to advise clinic of pt's d/c today and that pt should resume care tomorrow.   Olivia Canter Renal Navigator [phone]

## 2023-06-14 NOTE — Care Management Obs Status (Signed)
MEDICARE OBSERVATION STATUS NOTIFICATION   Patient Details  Name: [name] [name] MRN: [id number] Date of Birth: 11-13-1980   Medicare Observation Status Notification Given:  Yes    Tom-Johnson, Hershal Coria, RN 06/14/2023, 12:45 PM

## 2023-06-14 NOTE — Progress Notes (Signed)
   06/14/23 0257  Vitals  Temp 97.8 F (36.6 C)  Temp Source Oral  BP (!) 134/59  MAP (mmHg) 80  BP Location Right Arm  BP Method Automatic  Patient Position (if appropriate) Lying  Pulse Rate 77  Pulse Rate Source Monitor  ECG Heart Rate 80  Resp 16  Oxygen Therapy  SpO2 93 %  During Treatment Monitoring  Blood Flow Rate (mL/min) 399 mL/min  Arterial Pressure (mmHg) -153.12 mmHg  Venous Pressure (mmHg) 303.62 mmHg  TMP (mmHg) 13.33 mmHg  Ultrafiltration Rate (mL/min) 1152 mL/min  Dialysate Flow Rate (mL/min) 300 ml/min  Dialysate Potassium Concentration 2  Dialysate Calcium Concentration 2.5  Duration of HD Treatment -hour(s) 3.24 hour(s)  Cumulative Fluid Removed (mL) per Treatment  2485.29  Intra-Hemodialysis Comments Tx completed  Post Treatment  Dialyzer Clearance Lightly streaked  Liters Processed 78  Fluid Removed (mL) 2500 mL  Tolerated HD Treatment Yes  Post-Hemodialysis Comments Pt tolerated treatment without complications.Dialysis uneventful  AVG/AVF Arterial Site Held (minutes) 10 minutes  AVG/AVF Venous Site Held (minutes) 10 minutes  Fistula / Graft Right Upper arm Arteriovenous fistula  Placement Date/Time: (c) 12/08/20 1403   Placed prior to admission: Yes  Orientation: Right  Access Location: Upper arm  Access Type: Arteriovenous fistula  Site Condition No complications  Fistula / Graft Assessment Present;Thrill;Bruit  Status Deaccessed  Needle Size 16  Drainage Description None   Handoff given to [employer] administrator

## 2023-06-14 NOTE — Progress Notes (Addendum)
[facility] Progress Note   Subjective:   Patient seen and examined at bedside.  Dialysis tolerated well overnight.  Reports feeling better today, abdominal pain improved.  Bleeding slowed today. Denies CP, SOB, abdominal pain, and n/v/d.    Objective Vitals:   06/14/23 0230 06/14/23 0257 06/14/23 0404 06/14/23 0928  BP: 132/60 (!) 134/59 (!) 142/62 (!) 155/55  Pulse: 78 77 79 74  Resp: 13 16 18 20   Temp:  97.8 F (36.6 C) 98.4 F (36.9 C) 98.3 F (36.8 C)  TempSrc:  Oral Oral Oral  SpO2:  93% 92% 93%   Physical Exam General:WDWN female in NAD Heart:RRR, no mrg Lungs:CTAB, nml WOB Abdomen:soft, NTND Extremities:no LE edema Dialysis Access: RU AVF +b/t   There were no vitals filed for this visit.  Intake/Output Summary (Last 24 hours) at 06/14/2023 1126 Last data filed at 06/14/2023 0900 Gross per 24 hour  Intake 780 ml  Output 2500 ml  Net -1720 ml    Additional Objective Labs: Basic Metabolic Panel: Recent Labs  Lab 06/13/23 0008 06/13/23 1611 06/14/23 0521  NA 140  --  138  K 5.3*  --  3.5  CL 96*  --  92*  CO2 26  --  32  GLUCOSE 89  --  96  BUN 41*  --  17  CREATININE 7.70*  --  4.79*  CALCIUM 9.2  --  9.1  PHOS  --  7.9*  --    Liver Function Tests: Recent Labs  Lab 06/13/23 0008 06/14/23 0521  AST 18 15  ALT 18 16  ALKPHOS 62 67  BILITOT 0.5 0.3  PROT 6.8 6.7  ALBUMIN 3.3* 3.2*   CBC: Recent Labs  Lab 06/13/23 0008 06/13/23 1345 06/14/23 0521  WBC 3.4* 4.0 4.5  NEUTROABS 1.9  --   --   HGB 5.9* 8.4* 9.3*  HCT 18.6* 25.7* 28.2*  MCV 100.0 93.8 92.2  PLT 119* 115* 130*   Iron Studies:  Recent Labs    06/13/23 1345  IRON 137  TIBC 235*  TRANSFERRIN 170*  FERRITIN 490*   Lab Results  Component Value Date   INR 1.2 06/13/2023   INR 1.3 (H) 12/01/2021   INR 1.2 12/20/2018   Studies/Results: US PELVIC COMPLETE WITH TRANSVAGINAL  Result Date: 06/13/2023 CLINICAL DATA:  Prolonged vaginal bleeding, LMP  05/14/2023 EXAM: TRANSABDOMINAL AND TRANSVAGINAL ULTRASOUND OF PELVIS TECHNIQUE: Both transabdominal and transvaginal ultrasound examinations of the pelvis were performed. Transabdominal technique was performed for global imaging of the pelvis including uterus, ovaries, adnexal regions, and pelvic cul-de-sac. It was necessary to proceed with endovaginal exam following the transabdominal exam to visualize the endometrium and ovaries bilaterally. COMPARISON:  None Available. FINDINGS: Uterus Measurements: 7.0 x 2.7 x 4.5 cm = volume: 44 mL. Uterus anteverted. The cervix is unremarkable. A a rounded heterogeneously hypoechoic solid subserosal/exophytic mass is seen within the right uterine fundus compatible with a uterine fibroid measuring 13 mm in greatest dimension. There are extensive parenchymal calcifications seen within the uterus related to arteriosclerosis better appreciated on prior CT examination. Endometrium Thickness: 3 mm.  No focal abnormality visualized. Right ovary Not visualized.  No adnexal mass. Left ovary Measurements: 1.9 x 1.6 x 1.9 cm = volume: 3 mL. Normal appearance/no adnexal mass. Other findings No abnormal free fluid. IMPRESSION: 1. 13 mm subserosal/exophytic uterine fibroid. 2. Nonvisualization of the right ovary. No adnexal mass. Electronically Signed   By: Helyn Numbers M.D.   On: 06/13/2023 01:51  Medications:   alum & mag hydroxide-simeth  30 mL Oral Once   amLODipine  10 mg Oral QHS   cinacalcet  90 mg Oral Q supper   cycloSPORINE modified  200 mg Oral BID   doxercalciferol  4 mcg Intravenous Q M,W,F-HD   ferric citrate  630 mg Oral TID WC   hydrALAZINE  50 mg Oral Q8H   losartan  100 mg Oral Daily   megestrol  40 mg Oral BID   mycophenolate  540 mg Oral BID   predniSONE  15 mg Oral Daily   sodium chloride flush  3 mL Intravenous Q12H   sodium zirconium cyclosilicate  10 g Oral Daily    Dialysis Orders: SW MWF 3h   400/500  59kg  2/2 bath   AVF   Heparin  none - last OP HD 10/07, post wt 61.kg (2.5 kg up) - mircera 100 mcg q 2, last 10/07, due 10/21  - hectorol 4 mcg - sensipar 90 mg      VS 160/ 75  HR 78  RR 15  afeb   RA 93%    K+ 5.3  BUN 41  creat 7.7  alb 3.3  Hb 5.9  Hct 18  WBC 3K      Assessment/ Plan: Anemia/ vaginal bleeding - non-menstrual bleeding, +uterine fibroids. Megace started. Per pmd.  ESRD - on HD MWF. HD overnight, again tomorrow per regular schedule. HTN/ BP - BP's high normal. Cont home bp lowering meds.  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 Cont IS regimen w/ cyclosporine and myfortic.  Volume - no vol excess. Get wts.  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 Next esa due on 10/21. Iron in goal. Follow.  MBD ckd - CCa in range, phos elevated. Cont binder, hectorol and sensipar.  Diet - Renal diet w/fluid restrictions.   Virgina Norfolk, PA-C [facility] 06/14/2023,11:26 AM  LOS: 0 days

## 2023-06-14 NOTE — Discharge Summary (Addendum)
Physician Discharge Summary   Patient: [name] [name] MRN: [id number] DOB: [birth year]  Admit date:     06/12/2023  Discharge date: 06/14/23  Discharge Physician: Arnetha Courser   PCP: Center, [facility]   Recommendations at discharge:  Please obtain CBC and BMP on follow-up Follow-up with gynecology within a week for definitive treatment of menorrhagia and uterine fibroid. Patient is being given 1 week supply of Megace Continue scheduled dialysis  Discharge Diagnoses: Principal Problem:   Anemia Active Problems:   Vaginal bleeding   Hospital Course: Taken from H&P.  [name] [name] is a 42 y.o. 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 Saw her PCP last week, and had lab work with dialysis on Monday but hemoglobin is 6.5.  Her HD team had attempted to schedule her for an outpatient transfusion, but this had not been set up yet.  She had worsening lightheadedness, and today think she had a syncopal episode.   She was found to have a hemoglobin of 5.9 on admission. TVUS demonstrating 13 mm uterine fibroid, endometrial lining 3 mm, right ovary not seen.  No adnexal mass or free fluid.  Etiology of her DUB suspected multifactorial with uterine fibroid, uremic platelet dysfunction, thrombocytopenia, question underlying bleeding diathesis (with history of spontaneous SDH).   2 unit of PRBC was ordered and gynecology was consulted and they were recommending starting on Megace twice daily and outpatient follow-up.  Patient likely will get benefit from outpatient hematology evaluation for underlying bleeding disorder.  Posttransfusion hemoglobin 8.4.  Continue to have vaginal bleeding, started on Megace.  Going for her routine dialysis  today.  Anemia panel with no significant deficiency.  10/10: Patient remained stable.  Improving hemoglobin to 9.3.  Vaginal bleeding slowing down significantly.  Patient was given a 7-day supply of Megace 40 mg twice daily as recommended by our gynecologist and need to have a very close follow-up with her gynecology for further management of her uterine fibroids and menorrhagia.  Patient did receive her routine dialysis yesterday and will continue with her scheduled dialysis as outpatient.  Patient will continue on her current medications and need to have a close follow-up with her providers for further management.  Pain control - [employer] Controlled Substance Reporting System database was reviewed. and patient was instructed, not to drive, operate heavy machinery, perform activities at heights, swimming or participation in water activities or provide baby-sitting services while on Pain, Sleep and Anxiety Medications; until their outpatient Physician has advised to do so again. Also recommended to not to take more than prescribed Pain, Sleep and Anxiety Medications.  Consultants: Gynecology, nephrology Procedures performed: Hemodialysis Disposition: Home Diet recommendation:  Discharge Diet Orders (From admission, onward)     Start     Ordered   06/14/23 0000  Diet - low sodium heart healthy        06/14/23 1144           Renal diet DISCHARGE MEDICATION: Allergies as of 06/14/2023   No Known Allergies      Medication List     TAKE these medications    Ambien 5 MG tablet Generic drug: zolpidem Take 5 mg by mouth at bedtime.   amLODipine 10 MG tablet Commonly known as: NORVASC Take 1 tablet (10 mg total) by mouth daily. What  changed: when to take this   aspirin EC 81 MG tablet Chew 81 mg by mouth daily.   Auryxia 1 GM 210 MG(Fe) tablet Generic drug: ferric citrate Take 210-630 mg by mouth See admin instructions. Take 3 tablets (630 mg) by mouth three times daily  with meals, take 1 tablet (210 mg) with snacks   carvedilol 12.5 MG tablet Commonly known as: COREG Take 1 tablet (12.5 mg total) by mouth 2 (two) times daily with a meal.   cinacalcet 30 MG tablet Commonly known as: SENSIPAR Take 3 tablets (90 mg total) by mouth daily with supper.   cycloSPORINE modified 25 MG capsule Commonly known as: NEORAL Take 25 mg by mouth daily.   cycloSPORINE modified 100 MG capsule Commonly known as: NEORAL Take 2 capsules (200 mg total) by mouth 2 (two) times daily.   Darbepoetin Alfa 60 MCG/0.3ML Sosy injection Commonly known as: ARANESP Inject 0.3 mLs (60 mcg total) into the vein every Friday with hemodialysis.   doxercalciferol 4 MCG/2ML injection Commonly known as: HECTOROL Inject 3 mLs (6 mcg total) into the vein every Monday, Wednesday, and Friday with hemodialysis.   DULoxetine 20 MG capsule Commonly known as: CYMBALTA Take 1 capsule (20 mg total) by mouth daily.   hydrALAZINE 25 MG tablet Commonly known as: APRESOLINE Take 3 tablets (75 mg total) by mouth every 8 (eight) hours.   hydrOXYzine 25 MG capsule Commonly known as: VISTARIL Take 25 mg by mouth 3 (three) times daily as needed for anxiety.   lidocaine-prilocaine cream Commonly known as: EMLA Apply 1 application  topically daily as needed (prior to port access).   Lokelma 10 g Pack packet Generic drug: sodium zirconium cyclosilicate Take 10 g by mouth See admin instructions. Take one packet (10 g) by mouth (dissolved in water) daily on Non-Dialysis (Sun,Tues, Thurs, Sat)   losartan 100 MG tablet Commonly known as: Cozaar Take 1 tablet (100 mg total) by mouth daily.   megestrol 40 MG tablet Commonly known as: MEGACE Take 1 tablet (40 mg total) by mouth 2 (two) times daily for 7 days.   metoCLOPramide 5 MG tablet Commonly known as: REGLAN Take 5 mg by mouth every 6 (six) hours as needed for nausea or vomiting.   mycophenolate 180 MG EC tablet Commonly known as:  MYFORTIC Take 540 mg by mouth 2 (two) times daily.   ondansetron 4 MG tablet Commonly known as: ZOFRAN Take 4 mg by mouth every 8 (eight) hours as needed for nausea or vomiting.   oxyCODONE-acetaminophen 5-325 MG tablet Commonly known as: PERCOCET/ROXICET Take 1 tablet by mouth 3 (three) times daily.   pantoprazole 40 MG tablet Commonly known as: PROTONIX Take 1 tablet (40 mg total) by mouth daily.   predniSONE 5 MG tablet Commonly known as: DELTASONE Take 15 mg by mouth daily.        Follow-up Information     Center, [facility]. Schedule an appointment as soon as possible for a visit in 1 week(s).   Contact information: [address] [name] [address] [phone]                Discharge Exam: There were no vitals filed for this visit. General.  Thin built lady, in no acute distress. Pulmonary.  Lungs clear bilaterally, normal respiratory effort. CV.  Regular rate and rhythm, no JVD, rub or murmur. Abdomen.  Soft, nontender, nondistended, BS positive. CNS.  Alert and oriented .  No focal neurologic deficit. Extremities.  No edema,  no cyanosis, pulses intact and symmetrical. Psychiatry.  Judgment and insight appears normal.   Condition at discharge: stable  The results of significant diagnostics from this hospitalization (including imaging, microbiology, ancillary and laboratory) are listed below for reference.   Imaging Studies: US PELVIC COMPLETE WITH TRANSVAGINAL  Result Date: 06/13/2023 CLINICAL DATA:  Prolonged vaginal bleeding, LMP 05/14/2023 EXAM: TRANSABDOMINAL AND TRANSVAGINAL ULTRASOUND OF PELVIS TECHNIQUE: Both transabdominal and transvaginal ultrasound examinations of the pelvis were performed. Transabdominal technique was performed for global imaging of the pelvis including uterus, ovaries, adnexal regions, and pelvic cul-de-sac. It was necessary to proceed with endovaginal exam following the transabdominal exam to visualize the  endometrium and ovaries bilaterally. COMPARISON:  None Available. FINDINGS: Uterus Measurements: 7.0 x 2.7 x 4.5 cm = volume: 44 mL. Uterus anteverted. The cervix is unremarkable. A a rounded heterogeneously hypoechoic solid subserosal/exophytic mass is seen within the right uterine fundus compatible with a uterine fibroid measuring 13 mm in greatest dimension. There are extensive parenchymal calcifications seen within the uterus related to arteriosclerosis better appreciated on prior CT examination. Endometrium Thickness: 3 mm.  No focal abnormality visualized. Right ovary Not visualized.  No adnexal mass. Left ovary Measurements: 1.9 x 1.6 x 1.9 cm = volume: 3 mL. Normal appearance/no adnexal mass. Other findings No abnormal free fluid. IMPRESSION: 1. 13 mm subserosal/exophytic uterine fibroid. 2. Nonvisualization of the right ovary. No adnexal mass. Electronically Signed   By: Helyn Numbers M.D.   On: 06/13/2023 01:51    Microbiology: Results for orders placed or performed during the hospital encounter of 03/06/22  Surgical PCR screen     Status: Abnormal   Collection Time: 03/07/22  9:20 PM   Specimen: Nasal Mucosa; Nasal Swab  Result Value Ref Range Status   MRSA, PCR NEGATIVE NEGATIVE Final   Staphylococcus aureus POSITIVE (A) NEGATIVE Final    Comment: (NOTE) The Xpert SA Assay (FDA approved for NASAL specimens in patients 9 years of age and older), is one component of a comprehensive surveillance program. It is not intended to diagnose infection nor to guide or monitor treatment. Performed at [facility], [address] [place], [name] [address]     Labs: CBC: Recent Labs  Lab 06/13/23 0008 06/13/23 1345 06/14/23 0521  WBC 3.4* 4.0 4.5  NEUTROABS 1.9  --   --   HGB 5.9* 8.4* 9.3*  HCT 18.6* 25.7* 28.2*  MCV 100.0 93.8 92.2  PLT 119* 115* 130*   Basic Metabolic Panel: Recent Labs  Lab 06/13/23 0008 06/13/23 1611 06/14/23 0521  NA 140  --  138  K 5.3*  --   3.5  CL 96*  --  92*  CO2 26  --  32  GLUCOSE 89  --  96  BUN 41*  --  17  CREATININE 7.70*  --  4.79*  CALCIUM 9.2  --  9.1  PHOS  --  7.9*  --    Liver Function Tests: Recent Labs  Lab 06/13/23 0008 06/14/23 0521  AST 18 15  ALT 18 16  ALKPHOS 62 67  BILITOT 0.5 0.3  PROT 6.8 6.7  ALBUMIN 3.3* 3.2*   CBG: No results for input(s): "GLUCAP" in the last 168 hours.  Discharge time spent: greater than 30 minutes.  This record has been created using Conservation officer, historic buildings. Errors have been sought and corrected,but may not always be located. Such creation errors do not reflect on the standard of care.   Signed: Arnetha Courser, MD  [name] 06/14/2023

## 2023-06-14 NOTE — TOC Transition Note (Signed)
Transition of Care [facility]) - CM/SW Discharge Note   Patient Details  Name: [name] [name] MRN: [id number] Date of Birth: 1981-08-21  Transition of [facility] CM/SW Contact:  Tom-Johnson, Hershal Coria, RN Phone Number: 06/14/2023, 12:47 PM   Clinical Narrative:     Patient is scheduled for discharge today.  Outpatient f/u, hospital f/u and discharge instructions on AVS. Prescriptions sent to [facility] pharmacy and meds will be delivered to patient at bedside prior discharge. No TOC needs or recommendations noted. Quincy Carnes to transport at discharge.  No further TOC needs noted.         Final next level of care: Home/Self Care Barriers to Discharge: Barriers Resolved   Patient Goals and CMS Choice CMS Medicare.gov Compare Post Acute Care list provided to:: Patient Choice offered to / list presented to : NA  Discharge Placement                  Patient to be transferred to facility by: Friend Name of family member notified: Nakema/Cameron    Discharge Plan and Services Additional resources added to the After Visit Summary for                  DME Arranged: N/A DME Agency: NA       HH Arranged: NA HH Agency: NA        Social Determinants of Health (SDOH) Interventions SDOH Screenings   Food Insecurity: No Food Insecurity (06/13/2023)  Housing: Patient Declined (06/13/2023)  Transportation Needs: No Transportation Needs (06/13/2023)  Utilities: Not At Risk (06/13/2023)  Social Connections: Unknown (10/04/2022)   Received from [facility]  Tobacco Use: Medium Risk (06/13/2023)     Readmission Risk Interventions    11/17/2021   12:15 PM  Readmission Risk Prevention Plan  Transportation Screening Complete  Medication Review (RN Care Manager) Complete  PCP or Specialist appointment within 3-5 days of discharge Complete  HRI or Home Care Consult Complete  SW Recovery Care/Counseling Consult Complete  Palliative Care Screening Not  Applicable  Skilled Nursing Facility Patient Refused

## 2023-06-14 NOTE — Progress Notes (Signed)
   06/13/23 2100  Pain Assessment  Pain Scale 0-10  Pain Score 10  Pain Type Acute pain  Pain Location Abdomen  Pain Orientation Lower  Pain Descriptors / Indicators Aching;Throbbing  Pain Frequency Constant  Pain Onset On-going  Patients Stated Pain Goal 0  Pain Intervention(s) Emotional support;MD notified (Comment)  Provider Notification  Provider Name/Title Dr. Janalyn Shy  Date Provider Notified 06/13/23  Time Provider Notified 2100  Method of Notification Page  Notification Reason Requested by patient/family  Provider response See new orders  Date of Provider Response 06/13/23   Patient is complaining of severe lower abdominal pain related to vaginal bleeding.  Her bleeding remains heavy per patient.  The oxycodone is not relieving her pain.  Dr. Janalyn Shy made aware.  New orders received from [facility].  Orders implemented.  Will continue to monitor patient.  Bernie Covey RN

## 2023-06-14 NOTE — Progress Notes (Signed)
DISCHARGE NOTE HOME [name] [name] to be discharged Home per MD order. Discussed prescriptions and follow up appointments with the patient. Prescriptions given to patient; medication list explained in detail. Patient verbalized understanding.  Skin clean, dry and intact without evidence of skin break down, no evidence of skin tears noted. IV catheter discontinued intact. Site without signs and symptoms of complications. Dressing and pressure applied. Pt denies pain at the site currently. No complaints noted.  Patient free of lines, drains, and wounds.   An After Visit Summary (AVS) was printed and given to the patient. Patient escorted via wheelchair, and discharged home via private auto. Pick up by friend Wylie Hail, RN

## 2023-06-14 NOTE — Discharge Planning (Signed)
[facility] Patient Discharge Orders- [facility] CLINIC: [facility]  Patient's name: [name] [name] ZOXWR Admit/DC Dates: 06/12/2023 - 06/14/2023  Discharge Diagnoses: Symptomatic anemia 2/2 vaginal bleeding likely from fibroids     Aranesp: Given: no   Last Hgb: 9.3 PRBC's Given: yes Date/# of units: 2 units on 10/9 ESA dose for discharge: no change IV Iron dose at discharge: no change  Heparin change: no  EDW Change: no New EDW:   [place] Change: no  Access intervention/Change: no Details:  Hectorol/Calcitriol change: no  Discharge Labs: Calcium 9.1 Phosphorus 7.9 Albumin 3.2 K+ 3.5  IV Antibiotics: no Details:  On Coumadin?: no Last INR: Next INR: Managed By:   OTHER/APPTS/LAB ORDERS:    D/C Meds to be reconciled by nurse after every discharge.  Completed By: Virgina Norfolk, PA-C   Reviewed by: MD:______ RN_______

## 2023-07-20 ENCOUNTER — Other Ambulatory Visit (HOSPITAL_BASED_OUTPATIENT_CLINIC_OR_DEPARTMENT_OTHER): Payer: Self-pay | Admitting: Nephrology

## 2023-07-20 DIAGNOSIS — R140 Abdominal distension (gaseous): Secondary | ICD-10-CM

## 2023-08-07 ENCOUNTER — Encounter (HOSPITAL_COMMUNITY): Payer: Self-pay

## 2023-08-07 ENCOUNTER — Telehealth (HOSPITAL_BASED_OUTPATIENT_CLINIC_OR_DEPARTMENT_OTHER): Payer: Self-pay | Admitting: Nephrology

## 2023-08-24 ENCOUNTER — Ambulatory Visit (HOSPITAL_BASED_OUTPATIENT_CLINIC_OR_DEPARTMENT_OTHER): Payer: 59 | Attending: Nephrology

## 2023-09-26 ENCOUNTER — Other Ambulatory Visit: Payer: Self-pay

## 2023-09-26 ENCOUNTER — Emergency Department (HOSPITAL_COMMUNITY)
Admission: EM | Admit: 2023-09-26 | Discharge: 2023-09-26 | Disposition: A | Discharge status: Home / Self Care | Payer: 59 | Attending: Emergency Medicine | Admitting: Emergency Medicine

## 2023-09-26 ENCOUNTER — Emergency Department (HOSPITAL_COMMUNITY): Payer: 59

## 2023-09-26 ENCOUNTER — Encounter (HOSPITAL_COMMUNITY): Payer: Self-pay

## 2023-09-26 DIAGNOSIS — Z7982 Long term (current) use of aspirin: Secondary | ICD-10-CM | POA: Diagnosis not present

## 2023-09-26 DIAGNOSIS — R1013 Epigastric pain: Secondary | ICD-10-CM | POA: Diagnosis not present

## 2023-09-26 DIAGNOSIS — R112 Nausea with vomiting, unspecified: Secondary | ICD-10-CM | POA: Diagnosis not present

## 2023-09-26 DIAGNOSIS — R1084 Generalized abdominal pain: Secondary | ICD-10-CM

## 2023-09-26 DIAGNOSIS — Z79899 Other long term (current) drug therapy: Secondary | ICD-10-CM | POA: Insufficient documentation

## 2023-09-26 DIAGNOSIS — R1011 Right upper quadrant pain: Secondary | ICD-10-CM | POA: Diagnosis present

## 2023-09-26 DIAGNOSIS — Z992 Dependence on renal dialysis: Secondary | ICD-10-CM | POA: Diagnosis not present

## 2023-09-26 DIAGNOSIS — I120 Hypertensive chronic kidney disease with stage 5 chronic kidney disease or end stage renal disease: Secondary | ICD-10-CM | POA: Insufficient documentation

## 2023-09-26 DIAGNOSIS — N186 End stage renal disease: Secondary | ICD-10-CM | POA: Diagnosis not present

## 2023-09-26 LAB — COMPREHENSIVE METABOLIC PANEL
ALT: 15 U/L (ref 0–44)
AST: 30 U/L (ref 15–41)
Albumin: 4.3 g/dL (ref 3.5–5.0)
Alkaline Phosphatase: 87 U/L (ref 38–126)
Anion gap: 23 — ABNORMAL HIGH (ref 5–15)
BUN: 22 mg/dL — ABNORMAL HIGH (ref 6–20)
CO2: 20 mmol/L — ABNORMAL LOW (ref 22–32)
Calcium: 9.1 mg/dL (ref 8.9–10.3)
Chloride: 94 mmol/L — ABNORMAL LOW (ref 98–111)
Creatinine, Ser: 5.92 mg/dL — ABNORMAL HIGH (ref 0.44–1.00)
GFR, Estimated: 9 mL/min — ABNORMAL LOW (ref >60)
Glucose, Bld: 77 mg/dL (ref 70–99)
Potassium: 5.3 mmol/L — ABNORMAL HIGH (ref 3.5–5.1)
Sodium: 137 mmol/L (ref 135–145)
Total Bilirubin: 1.4 mg/dL — ABNORMAL HIGH (ref 0.0–1.2)
Total Protein: 8.2 g/dL — ABNORMAL HIGH (ref 6.5–8.1)

## 2023-09-26 LAB — CBC
HCT: 43.1 % (ref 36.0–46.0)
Hemoglobin: 13.8 g/dL (ref 12.0–15.0)
MCH: 30.9 pg (ref 26.0–34.0)
MCHC: 32 g/dL (ref 30.0–36.0)
MCV: 96.6 fL (ref 80.0–100.0)
Platelets: 139 10*3/uL — ABNORMAL LOW (ref 150–400)
RBC: 4.46 MIL/uL (ref 3.87–5.11)
RDW: 13.5 % (ref 11.5–15.5)
WBC: 3.9 10*3/uL — ABNORMAL LOW (ref 4.0–10.5)
nRBC: 0 % (ref 0.0–0.2)

## 2023-09-26 LAB — LIPASE, BLOOD: Lipase: 36 U/L (ref 11–51)

## 2023-09-26 LAB — HCG, SERUM, QUALITATIVE: Preg, Serum: NEGATIVE

## 2023-09-26 LAB — MAGNESIUM: Magnesium: 2.8 mg/dL — ABNORMAL HIGH (ref 1.7–2.4)

## 2023-09-26 MED ORDER — MORPHINE SULFATE (PF) 4 MG/ML IV SOLN
4.0000 mg | Freq: Once | INTRAVENOUS | Status: AC
  Administered 2023-09-26: 4 mg via INTRAVENOUS
  Filled 2023-09-26: qty 1

## 2023-09-26 MED ORDER — AMLODIPINE BESYLATE 5 MG PO TABS
10.0000 mg | ORAL_TABLET | Freq: Once | ORAL | Status: AC
  Administered 2023-09-26: 10 mg via ORAL
  Filled 2023-09-26: qty 2

## 2023-09-26 MED ORDER — MORPHINE SULFATE (PF) 2 MG/ML IV SOLN
2.0000 mg | Freq: Once | INTRAVENOUS | Status: AC
  Administered 2023-09-26: 2 mg via INTRAVENOUS
  Filled 2023-09-26: qty 1

## 2023-09-26 MED ORDER — LOSARTAN POTASSIUM 50 MG PO TABS
100.0000 mg | ORAL_TABLET | Freq: Once | ORAL | Status: AC
  Administered 2023-09-26: 100 mg via ORAL
  Filled 2023-09-26: qty 2

## 2023-09-26 MED ORDER — LABETALOL HCL 5 MG/ML IV SOLN
10.0000 mg | Freq: Once | INTRAVENOUS | Status: AC
  Administered 2023-09-26: 10 mg via INTRAVENOUS
  Filled 2023-09-26: qty 4

## 2023-09-26 MED ORDER — LORAZEPAM 2 MG/ML IJ SOLN
0.5000 mg | Freq: Once | INTRAMUSCULAR | Status: AC
  Administered 2023-09-26: 0.5 mg via INTRAVENOUS
  Filled 2023-09-26: qty 1

## 2023-09-26 MED ORDER — LORAZEPAM 2 MG/ML IJ SOLN
1.0000 mg | Freq: Once | INTRAMUSCULAR | Status: AC
  Administered 2023-09-26: 1 mg via INTRAVENOUS
  Filled 2023-09-26: qty 1

## 2023-09-26 NOTE — Discharge Instructions (Addendum)
It was a pleasure caring for you today.  As discussed, please follow-up with primary care and seek emergency care if experiencing any new or worsening symptoms.

## 2023-09-26 NOTE — ED Provider Triage Note (Signed)
Emergency Medicine Provider Triage Evaluation Note  [name] [name] , a 43 y.o. female  was evaluated in triage.  Pt complains of abdominal pain. 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 Reports feels not like prior gastroparesis, is worse. Did get full HD session today.  Review of Systems  Positive: Abdominal pain, nausea Negative: Diarrhea, melena  Physical Exam  BP (!) 198/97 (BP Location: Left Arm)   Pulse 91   Temp 98.8 F (37.1 C) (Oral)   Resp 16   Ht 5\' 2"  (1.575 m)   Wt 56.7 kg   SpO2 96%   BMI 22.86 kg/m  Gen:   Awake, no distress  Resp:  Normal effort  MSK:   Moves extremities without difficulty  Other:  Diffuse abdominal TTP  Medical Decision Making  Medically screening exam initiated at 1:00 PM.  Appropriate orders placed.  [name] [name] was informed that the remainder of the evaluation will be completed by another provider, this initial triage assessment does not replace that evaluation, and the importance of remaining in the ED until their evaluation is complete.     Lonell Grandchild, MD 09/26/23 [phone]

## 2023-09-26 NOTE — ED Provider Notes (Signed)
    Accepted handoff at shift change from [facility]. Please see prior provider note for more detail.   Briefly: Patient is 43 y.o. "presented for right upper quadrant pain that began last night nausea vomiting. Patient went to dialysis today and was able to complete a full session however states that she is still hypertensive with right upper quadrant pain. Patient states still has her gallbladder. Patient denies chest pain or shortness of breath or fevers."  DDX: concern for gastroenteritis, colitis, small bowel obstruction, appendicitis, cholecystitis, hepatobiliary pathology, gastritis, PUD, ACS, aortic dissection, diverticulosis/diverticulitis, pancreatitis, nephrolithiasis, medication induced, AAA, UTI, pyelonephritis, testicular torsion.   Plan:  - dispo pending imaging -CT without acute process. Patient does have high potassium but lab was hemolyzed. Patient with other electrolyte abnormalities which is leading to anion gap of 23. Shared this with patient and recommended inpatient care; however, patient stating that she feels better, has stopped vomiting, and has started eating. Shared decision making with patient who would like to go home. Patient does have many hospital admissions in the past and has a complicated medical history and advocates for herself that she knows when she needs to be admitted - and today she is stating that it would serve her better to be discharged home. Patient understands the risks of going home and I was nervous that patient's symptoms would reoccur when she got home - but patient reassuring me that she knows her body best and is wanting to go home. Patient understands that she needs to have a low threshold to come back to the emergency room if she is having any new or worsening symptoms. - Patient was also hypertensive in the emergency room.  Stating that she has not taken her blood pressure medicine in 2 days d/t the vomiting. Patient without vomiting after PO  challenge.  Provided patient with 1 dose of IV HTN medications and 2 of her oral medications.  Blood pressure resolved to 174/85. -Patient afebrile with stable vitals.  Provided with return precautions.  Recommended following up with PCP as well.  Patient verbalized understanding of plan.     Dorthy Cooler, [place] 09/26/23 2145    Rolan Bucco, MD 09/28/23 1451

## 2023-09-26 NOTE — ED Triage Notes (Addendum)
Pt arrives via EMS from the dialysis center. She did get her full treatment. Pt reports nausea, vomiting, and abdominal pain that started last night. Dialysis center gave her 4mg  of zofran and she took a dose of her ativan. Pt is AxOx4.

## 2023-09-26 NOTE — ED Provider Notes (Signed)
[facility] Provider Note   CSN: [id number] Arrival date & time: 09/26/23  1209     History  Chief Complaint  Patient presents with   Nausea   Emesis   Abdominal Pain   Hypertension    [name] [name] is a 43 y.o. female history of ESRD on dialysis, narcotic abuse, hypertension, gastroparesis, anxiety, anemia, pancreas transplant presented for right upper quadrant pain that began last night nausea vomiting.  Patient went to dialysis today and was able to complete a full session however states that she is still hypertensive with right upper quadrant pain.  Patient states still has her gallbladder.  Patient denies chest pain or shortness of breath or fevers.    Home Medications Prior to Admission medications   Medication Sig Start Date End Date Taking? Authorizing Provider  amLODipine (NORVASC) 10 MG tablet Take 1 tablet (10 mg total) by mouth daily. Patient taking differently: Take 10 mg by mouth at bedtime. 02/05/22 06/13/23  Lorin Glass, MD  aspirin EC 81 MG tablet Chew 81 mg by mouth daily.    [provider]  carvedilol (COREG) 12.5 MG tablet Take 1 tablet (12.5 mg total) by mouth 2 (two) times daily with a meal. 02/05/22 06/13/23  Dahal, Melina Schools, MD  cinacalcet (SENSIPAR) 30 MG tablet Take 3 tablets (90 mg total) by mouth daily with supper. 06/14/23   Arnetha Courser, MD  cycloSPORINE modified (NEORAL) 100 MG capsule Take 2 capsules (200 mg total) by mouth 2 (two) times daily. 12/25/21   Pokhrel, Rebekah Chesterfield, MD  cycloSPORINE modified (NEORAL) 25 MG capsule Take 25 mg by mouth daily.    [provider]  Darbepoetin Alfa (ARANESP) 60 MCG/0.3ML SOSY injection Inject 0.3 mLs (60 mcg total) into the vein every Friday with hemodialysis. 11/18/21   Lonia Blood, MD  doxercalciferol (HECTOROL) 4 MCG/2ML injection Inject 3 mLs (6 mcg total) into the vein every Monday, Wednesday, and Friday with hemodialysis. 11/17/21   Lonia Blood, MD  DULoxetine (CYMBALTA) 20 MG capsule Take 1 capsule (20 mg total) by mouth daily. Patient not taking: Reported on 06/13/2023 02/06/22 05/07/22  Lorin Glass, MD  ferric citrate (AURYXIA) 1 GM 210 MG(Fe) tablet Take 210-630 mg by mouth See admin instructions. Take 3 tablets (630 mg) by mouth three times daily with meals, take 1 tablet (210 mg) with snacks    [provider]  hydrALAZINE (APRESOLINE) 25 MG tablet Take 3 tablets (75 mg total) by mouth every 8 (eight) hours. 02/05/22 06/13/23  Lorin Glass, MD  hydrOXYzine (VISTARIL) 25 MG capsule Take 25 mg by mouth 3 (three) times daily as needed for anxiety. 05/26/23   [provider]  lidocaine-prilocaine (EMLA) cream Apply 1 application  topically daily as needed (prior to port access). 07/09/20   [provider]  losartan (COZAAR) 100 MG tablet Take 1 tablet (100 mg total) by mouth daily. 12/04/21 06/13/23  [place], Alvira Philips, DO  metoCLOPramide (REGLAN) 5 MG tablet Take 5 mg by mouth every 6 (six) hours as needed for nausea or vomiting.    [provider]  mycophenolate (MYFORTIC) 180 MG EC tablet Take 540 mg by mouth 2 (two) times daily.    [provider]  ondansetron (ZOFRAN) 4 MG tablet Take 4 mg by mouth every 8 (eight) hours as needed for nausea or vomiting.    [provider]  oxyCODONE-acetaminophen (PERCOCET/ROXICET) 5-325 MG tablet Take 1 tablet by mouth 3 (three) times daily.  [provider]  pantoprazole (PROTONIX) 40 MG tablet Take 1 tablet (40 mg total) by mouth daily. 02/06/22 05/07/22  Lorin Glass, MD  predniSONE (DELTASONE) 5 MG tablet Take 15 mg by mouth daily. 06/24/15   [provider]  sodium zirconium cyclosilicate (LOKELMA) 10 g PACK packet Take 10 g by mouth See admin instructions. Take one packet (10 g) by mouth (dissolved in water) daily on Non-Dialysis (Sun,Tues, Thurs, Sat)    [provider]  zolpidem (AMBIEN) 5 MG tablet Take 5 mg by  mouth at bedtime.    [provider]      Allergies    Patient has no known allergies.    Review of Systems   Review of Systems  Gastrointestinal:  Positive for abdominal pain and vomiting.    Physical Exam Updated Vital Signs BP (!) 185/95 (BP Location: Left Arm)   Pulse 91   Temp 98.8 F (37.1 C) (Oral)   Resp 16   Ht 5\' 2"  (1.575 m)   Wt 56.7 kg   SpO2 100%   BMI 22.86 kg/m  Physical Exam Constitutional:      General: She is not in acute distress.    Comments: Uncomfortable on exam  Cardiovascular:     Rate and Rhythm: Normal rate and regular rhythm.     Heart sounds: Normal heart sounds.  Pulmonary:     Effort: Pulmonary effort is normal. No respiratory distress.     Breath sounds: Normal breath sounds.  Abdominal:     General: Abdomen is flat.     Palpations: Abdomen is soft.     Tenderness: There is abdominal tenderness in the right upper quadrant and epigastric area. There is no guarding or rebound. Negative signs include Murphy's sign, Rovsing's sign, McBurney's sign and psoas sign.  Skin:    General: Skin is warm and dry.  Neurological:     Mental Status: She is alert.     ED Results / Procedures / Treatments   Labs (all labs ordered are listed, but only abnormal results are displayed) Labs Reviewed  CBC - Abnormal; Notable for the following components:      Result Value   WBC 3.9 (*)    Platelets 139 (*)    All other components within normal limits  LIPASE, BLOOD  COMPREHENSIVE METABOLIC PANEL  HCG, SERUM, QUALITATIVE    EKG None  Radiology No results found.  Procedures .Critical Care  Performed by: Netta Corrigan, PA-C Authorized by: Netta Corrigan, PA-C   Critical care provider statement:    Critical care time (minutes):  30   Critical care time was exclusive of:  Separately billable procedures and treating other patients   Critical care was necessary to treat or prevent imminent or life-threatening deterioration of the  following conditions:  Metabolic crisis   Critical care was time spent personally by me on the following activities:  Blood draw for specimens, development of treatment plan with patient or surrogate, discussions with consultants, evaluation of patient's response to treatment, examination of patient, obtaining history from patient or surrogate, review of old charts, re-evaluation of patient's condition, pulse oximetry, ordering and review of radiographic studies, ordering and review of laboratory studies and ordering and performing treatments and interventions   I assumed direction of critical care for this patient from another provider in my specialty: no     Care discussed with comment:  Oncoming PA-C     Medications Ordered in ED Medications  morphine (PF) 4  MG/ML injection 4 mg (4 mg Intravenous Given 09/26/23 1336)  LORazepam (ATIVAN) injection 1 mg (1 mg Intravenous Given 09/26/23 1346)    ED Course/ Medical Decision Making/ A&P                                 Medical Decision Making Amount and/or Complexity of Data Reviewed Labs: ordered. Radiology: ordered.  Risk Prescription drug management.   [name] [name] 43 y.o. presented today for abdominal pain.  Working DDx that I considered at this time includes, but not limited to, gastroenteritis, colitis, small bowel obstruction, appendicitis, cholecystitis, hepatobiliary pathology, gastritis, PUD, ACS, aortic dissection, diverticulosis/diverticulitis, pancreatitis, nephrolithiasis, medication induced, AAA, UTI, pyelonephritis, testicular torsion.  R/o DDx: Pending  Review of prior external notes: 06/12/2019 for discharge  Unique Tests and My Interpretation:  CBC with differential: Unremarkable CMP: Anion gap 23, most likely starvation ketoacidosis Lipase: Unremarkable Neck: Pending Serum hCG qualitative: Negative EKG: Sinus a 7 bpm, no ST elevations or depressions noted, prolonged QT noted CT abdomen pelvis without  contrast: Pending Rapid quadrant ultrasound: Cholelithiasis  Social Determinants of Health: EtOH/Substance Abuse  Discussion with Independent Historian: None  Discussion of Management of Tests: None  Risk: Medium: prescription drug management  Risk Stratification Score: None  Staffed with Belfi, MD   Plan: On exam patient was uncomfortable with stable vitals.  Patient did have right upper quadrant tenderness along with epigastric tenderness and states that she still is her gallbladder.  Patient took Ativan and Zofran at the dialysis clinic and states this initially helped however states that the nausea is coming back so we will give Ativan here until we can get her labs along with EKG.  Patient given morphine for pain.  Will get ultrasound is patient is ESRD so cannot use CT with contrast at this time.  After discussion with the attending we agreed to add on a CT abdomen pelvis without contrast to further evaluate this patient's liver enzymes are reassuring.  Patient does have anion gap of 23 which is most likely starvation ketoacidosis in the setting of nausea vomiting.  Will add on magnesium.  Quadrant ultrasound does show cholelithiasis.  Patient signed out to [place], [place].  Please review their note for the continuation of patient's care.  The plan at this point is follow-up on imaging and anticipate admission due to anion gap.  This chart was dictated using voice recognition software.  Despite best efforts to proofread,  errors can occur which can change the documentation meaning.         Final Clinical Impression(s) / ED Diagnoses Final diagnoses:  None    Rx / DC Orders ED Discharge Orders     None         Remi Deter 09/26/23 1509    Rolan Bucco, MD 09/26/23 1544

## 2023-10-01 LAB — CULTURE, BLOOD (ROUTINE X 2)
Culture: NO GROWTH
Culture: NO GROWTH

## 2023-10-06 ENCOUNTER — Emergency Department (HOSPITAL_COMMUNITY)
Admission: EM | Admit: 2023-10-06 | Discharge: 2023-10-07 | Discharge status: Against Medical Advice | Payer: 59 | Attending: Emergency Medicine | Admitting: Emergency Medicine

## 2023-10-06 ENCOUNTER — Encounter (HOSPITAL_COMMUNITY): Payer: Self-pay | Admitting: *Deleted

## 2023-10-06 ENCOUNTER — Other Ambulatory Visit: Payer: Self-pay

## 2023-10-06 DIAGNOSIS — H9202 Otalgia, left ear: Secondary | ICD-10-CM | POA: Diagnosis present

## 2023-10-06 DIAGNOSIS — Z5321 Procedure and treatment not carried out due to patient leaving prior to being seen by health care provider: Secondary | ICD-10-CM | POA: Diagnosis not present

## 2023-10-06 NOTE — ED Triage Notes (Signed)
Pt c/o left ear pain since yesterday; denies injury to ear, no recent injury

## 2023-10-07 NOTE — ED Notes (Signed)
PT left AMA 

## 2023-11-05 ENCOUNTER — Observation Stay (HOSPITAL_COMMUNITY)

## 2023-11-05 ENCOUNTER — Other Ambulatory Visit: Payer: Self-pay

## 2023-11-05 ENCOUNTER — Emergency Department (HOSPITAL_COMMUNITY)

## 2023-11-05 ENCOUNTER — Inpatient Hospital Stay (HOSPITAL_COMMUNITY)
Admission: EM | Admit: 2023-11-05 | Discharge: 2023-11-08 | DRG: 640 | Disposition: A | Discharge status: Home / Self Care | Attending: Internal Medicine | Admitting: Internal Medicine

## 2023-11-05 ENCOUNTER — Encounter (HOSPITAL_COMMUNITY): Payer: Self-pay

## 2023-11-05 DIAGNOSIS — R7989 Other specified abnormal findings of blood chemistry: Secondary | ICD-10-CM | POA: Diagnosis present

## 2023-11-05 DIAGNOSIS — Y830 Surgical operation with transplant of whole organ as the cause of abnormal reaction of the patient, or of later complication, without mention of misadventure at the time of the procedure: Secondary | ICD-10-CM | POA: Diagnosis present

## 2023-11-05 DIAGNOSIS — D631 Anemia in chronic kidney disease: Secondary | ICD-10-CM | POA: Diagnosis present

## 2023-11-05 DIAGNOSIS — K3184 Gastroparesis: Secondary | ICD-10-CM | POA: Diagnosis present

## 2023-11-05 DIAGNOSIS — Z91158 Patient's noncompliance with renal dialysis for other reason: Secondary | ICD-10-CM

## 2023-11-05 DIAGNOSIS — Z79899 Other long term (current) drug therapy: Secondary | ICD-10-CM

## 2023-11-05 DIAGNOSIS — Z8673 Personal history of transient ischemic attack (TIA), and cerebral infarction without residual deficits: Secondary | ICD-10-CM

## 2023-11-05 DIAGNOSIS — E875 Hyperkalemia: Secondary | ICD-10-CM | POA: Diagnosis not present

## 2023-11-05 DIAGNOSIS — R9431 Abnormal electrocardiogram [ECG] [EKG]: Secondary | ICD-10-CM | POA: Diagnosis present

## 2023-11-05 DIAGNOSIS — F419 Anxiety disorder, unspecified: Secondary | ICD-10-CM | POA: Diagnosis present

## 2023-11-05 DIAGNOSIS — Z794 Long term (current) use of insulin: Secondary | ICD-10-CM

## 2023-11-05 DIAGNOSIS — R112 Nausea with vomiting, unspecified: Secondary | ICD-10-CM | POA: Diagnosis present

## 2023-11-05 DIAGNOSIS — R109 Unspecified abdominal pain: Secondary | ICD-10-CM | POA: Diagnosis not present

## 2023-11-05 DIAGNOSIS — Z5982 Transportation insecurity: Secondary | ICD-10-CM

## 2023-11-05 DIAGNOSIS — E872 Acidosis, unspecified: Secondary | ICD-10-CM | POA: Diagnosis present

## 2023-11-05 DIAGNOSIS — G4489 Other headache syndrome: Secondary | ICD-10-CM | POA: Diagnosis present

## 2023-11-05 DIAGNOSIS — R1114 Bilious vomiting: Secondary | ICD-10-CM | POA: Diagnosis present

## 2023-11-05 DIAGNOSIS — K8020 Calculus of gallbladder without cholecystitis without obstruction: Secondary | ICD-10-CM | POA: Diagnosis present

## 2023-11-05 DIAGNOSIS — I160 Hypertensive urgency: Secondary | ICD-10-CM | POA: Diagnosis present

## 2023-11-05 DIAGNOSIS — F1190 Opioid use, unspecified, uncomplicated: Secondary | ICD-10-CM | POA: Diagnosis present

## 2023-11-05 DIAGNOSIS — Z7982 Long term (current) use of aspirin: Secondary | ICD-10-CM

## 2023-11-05 DIAGNOSIS — T8612 Kidney transplant failure: Secondary | ICD-10-CM | POA: Diagnosis present

## 2023-11-05 DIAGNOSIS — G9341 Metabolic encephalopathy: Secondary | ICD-10-CM | POA: Diagnosis present

## 2023-11-05 DIAGNOSIS — G9340 Encephalopathy, unspecified: Secondary | ICD-10-CM | POA: Diagnosis present

## 2023-11-05 DIAGNOSIS — E1043 Type 1 diabetes mellitus with diabetic autonomic (poly)neuropathy: Secondary | ICD-10-CM | POA: Diagnosis present

## 2023-11-05 DIAGNOSIS — R1011 Right upper quadrant pain: Secondary | ICD-10-CM | POA: Diagnosis present

## 2023-11-05 DIAGNOSIS — N186 End stage renal disease: Secondary | ICD-10-CM | POA: Diagnosis present

## 2023-11-05 DIAGNOSIS — Z9483 Pancreas transplant status: Secondary | ICD-10-CM

## 2023-11-05 DIAGNOSIS — E1022 Type 1 diabetes mellitus with diabetic chronic kidney disease: Secondary | ICD-10-CM | POA: Diagnosis present

## 2023-11-05 DIAGNOSIS — N2581 Secondary hyperparathyroidism of renal origin: Secondary | ICD-10-CM | POA: Diagnosis present

## 2023-11-05 DIAGNOSIS — Z992 Dependence on renal dialysis: Secondary | ICD-10-CM

## 2023-11-05 DIAGNOSIS — D696 Thrombocytopenia, unspecified: Secondary | ICD-10-CM | POA: Diagnosis present

## 2023-11-05 DIAGNOSIS — I120 Hypertensive chronic kidney disease with stage 5 chronic kidney disease or end stage renal disease: Secondary | ICD-10-CM | POA: Diagnosis present

## 2023-11-05 DIAGNOSIS — Z8249 Family history of ischemic heart disease and other diseases of the circulatory system: Secondary | ICD-10-CM

## 2023-11-05 DIAGNOSIS — R079 Chest pain, unspecified: Secondary | ICD-10-CM

## 2023-11-05 DIAGNOSIS — G8929 Other chronic pain: Secondary | ICD-10-CM | POA: Diagnosis present

## 2023-11-05 DIAGNOSIS — Z940 Kidney transplant status: Secondary | ICD-10-CM

## 2023-11-05 DIAGNOSIS — Z79891 Long term (current) use of opiate analgesic: Secondary | ICD-10-CM

## 2023-11-05 DIAGNOSIS — G253 Myoclonus: Secondary | ICD-10-CM | POA: Diagnosis present

## 2023-11-05 HISTORY — DX: End stage renal disease: Z99.2

## 2023-11-05 HISTORY — DX: Type 1 diabetes mellitus without complications: E10.9

## 2023-11-05 HISTORY — DX: End stage renal disease: N18.6

## 2023-11-05 LAB — COMPREHENSIVE METABOLIC PANEL
ALT: 10 U/L (ref 0–44)
AST: 11 U/L — ABNORMAL LOW (ref 15–41)
Albumin: 4.2 g/dL (ref 3.5–5.0)
Alkaline Phosphatase: 91 U/L (ref 38–126)
Anion gap: 25 — ABNORMAL HIGH (ref 5–15)
BUN: 111 mg/dL — ABNORMAL HIGH (ref 6–20)
CO2: 18 mmol/L — ABNORMAL LOW (ref 22–32)
Calcium: 9.8 mg/dL (ref 8.9–10.3)
Chloride: 95 mmol/L — ABNORMAL LOW (ref 98–111)
Creatinine, Ser: 11.51 mg/dL — ABNORMAL HIGH (ref 0.44–1.00)
GFR, Estimated: 4 mL/min — ABNORMAL LOW (ref >60)
Glucose, Bld: 80 mg/dL (ref 70–99)
Potassium: >7.5 mmol/L (ref 3.5–5.1)
Sodium: 138 mmol/L (ref 135–145)
Total Bilirubin: 1.2 mg/dL (ref 0.0–1.2)
Total Protein: 7.9 g/dL (ref 6.5–8.1)

## 2023-11-05 LAB — BLOOD GAS, VENOUS
Acid-Base Excess: 3.3 mmol/L — ABNORMAL HIGH (ref 0.0–2.0)
Bicarbonate: 27.8 mmol/L (ref 20.0–28.0)
Drawn by: 6498
O2 Saturation: 100 %
Patient temperature: 37
pCO2, Ven: 41 mmHg — ABNORMAL LOW (ref 44–60)
pH, Ven: 7.44 — ABNORMAL HIGH (ref 7.25–7.43)
pO2, Ven: 95 mmHg — ABNORMAL HIGH (ref 32–45)

## 2023-11-05 LAB — CBC WITH DIFFERENTIAL/PLATELET
Abs Immature Granulocytes: 0.01 10*3/uL (ref 0.00–0.07)
Basophils Absolute: 0 10*3/uL (ref 0.0–0.1)
Basophils Relative: 1 %
Eosinophils Absolute: 0.1 10*3/uL (ref 0.0–0.5)
Eosinophils Relative: 3 %
HCT: 40.5 % (ref 36.0–46.0)
Hemoglobin: 13 g/dL (ref 12.0–15.0)
Immature Granulocytes: 0 %
Lymphocytes Relative: 23 %
Lymphs Abs: 1 10*3/uL (ref 0.7–4.0)
MCH: 29.8 pg (ref 26.0–34.0)
MCHC: 32.1 g/dL (ref 30.0–36.0)
MCV: 92.9 fL (ref 80.0–100.0)
Monocytes Absolute: 0.2 10*3/uL (ref 0.1–1.0)
Monocytes Relative: 6 %
Neutro Abs: 2.9 10*3/uL (ref 1.7–7.7)
Neutrophils Relative %: 67 %
Platelets: 105 10*3/uL — ABNORMAL LOW (ref 150–400)
RBC: 4.36 MIL/uL (ref 3.87–5.11)
RDW: 13.2 % (ref 11.5–15.5)
WBC: 4.2 10*3/uL (ref 4.0–10.5)
nRBC: 0 % (ref 0.0–0.2)

## 2023-11-05 LAB — CBG MONITORING, ED
Glucose-Capillary: 89 mg/dL (ref 70–99)
Glucose-Capillary: 102 mg/dL — ABNORMAL HIGH (ref 70–99)
Glucose-Capillary: 91 mg/dL (ref 70–99)

## 2023-11-05 LAB — LIPASE, BLOOD: Lipase: 26 U/L (ref 11–51)

## 2023-11-05 LAB — POTASSIUM: Potassium: >7.5 mmol/L (ref 3.5–5.1)

## 2023-11-05 LAB — RENAL FUNCTION PANEL
Albumin: 3.6 g/dL (ref 3.5–5.0)
Anion gap: 19 — ABNORMAL HIGH (ref 5–15)
BUN: 33 mg/dL — ABNORMAL HIGH (ref 6–20)
CO2: 23 mmol/L (ref 22–32)
Calcium: 9.1 mg/dL (ref 8.9–10.3)
Chloride: 91 mmol/L — ABNORMAL LOW (ref 98–111)
Creatinine, Ser: 5.17 mg/dL — ABNORMAL HIGH (ref 0.44–1.00)
GFR, Estimated: 10 mL/min — ABNORMAL LOW (ref >60)
Glucose, Bld: 99 mg/dL (ref 70–99)
Phosphorus: 5.2 mg/dL — ABNORMAL HIGH (ref 2.5–4.6)
Potassium: 5 mmol/L (ref 3.5–5.1)
Sodium: 133 mmol/L — ABNORMAL LOW (ref 135–145)

## 2023-11-05 LAB — TROPONIN I (HIGH SENSITIVITY)
Troponin I (High Sensitivity): 39 ng/L — ABNORMAL HIGH (ref <18)
Troponin I (High Sensitivity): 36 ng/L — ABNORMAL HIGH (ref <18)

## 2023-11-05 LAB — HEPATITIS B SURFACE ANTIGEN: Hepatitis B Surface Ag: NONREACTIVE

## 2023-11-05 LAB — HCG, QUANTITATIVE, PREGNANCY: hCG, Beta Chain, Quant, S: 3 m[IU]/mL (ref <5)

## 2023-11-05 LAB — BETA-HYDROXYBUTYRIC ACID: Beta-Hydroxybutyric Acid: 0.5 mmol/L — ABNORMAL HIGH (ref 0.05–0.27)

## 2023-11-05 LAB — LACTIC ACID, PLASMA: Lactic Acid, Venous: 0.8 mmol/L (ref 0.5–1.9)

## 2023-11-05 LAB — PHOSPHORUS: Phosphorus: 5 mg/dL — ABNORMAL HIGH (ref 2.5–4.6)

## 2023-11-05 MED ORDER — CINACALCET HCL 30 MG PO TABS
120.0000 mg | ORAL_TABLET | ORAL | Status: DC
  Administered 2023-11-07: 120 mg via ORAL
  Filled 2023-11-05 (×2): qty 4

## 2023-11-05 MED ORDER — CHLORHEXIDINE GLUCONATE CLOTH 2 % EX PADS
6.0000 | MEDICATED_PAD | Freq: Every day | CUTANEOUS | Status: DC
  Administered 2023-11-06 – 2023-11-08 (×2): 6 via TOPICAL

## 2023-11-05 MED ORDER — ALBUTEROL SULFATE (2.5 MG/3ML) 0.083% IN NEBU
10.0000 mg | INHALATION_SOLUTION | Freq: Once | RESPIRATORY_TRACT | Status: AC
  Administered 2023-11-05: 10 mg via RESPIRATORY_TRACT
  Filled 2023-11-05: qty 12

## 2023-11-05 MED ORDER — METOPROLOL TARTRATE 5 MG/5ML IV SOLN
5.0000 mg | Freq: Once | INTRAVENOUS | Status: AC
  Administered 2023-11-05: 5 mg via INTRAVENOUS
  Filled 2023-11-05: qty 5

## 2023-11-05 MED ORDER — INSULIN ASPART 100 UNIT/ML IV SOLN
5.0000 [IU] | Freq: Once | INTRAVENOUS | Status: AC
  Administered 2023-11-05: 5 [IU] via INTRAVENOUS

## 2023-11-05 MED ORDER — CALCIUM GLUCONATE-NACL 1-0.675 GM/50ML-% IV SOLN
1.0000 g | Freq: Once | INTRAVENOUS | Status: AC
  Administered 2023-11-05: 1000 mg via INTRAVENOUS
  Filled 2023-11-05: qty 50

## 2023-11-05 MED ORDER — HEPARIN SODIUM (PORCINE) 5000 UNIT/ML IJ SOLN
5000.0000 [IU] | Freq: Three times a day (TID) | INTRAMUSCULAR | Status: DC
  Administered 2023-11-05 – 2023-11-07 (×3): 5000 [IU] via SUBCUTANEOUS
  Filled 2023-11-05 (×5): qty 1

## 2023-11-05 MED ORDER — DEXTROSE 10 % IV SOLN: Freq: Once | INTRAVENOUS | Status: AC

## 2023-11-05 MED ORDER — METOPROLOL TARTRATE 5 MG/5ML IV SOLN
10.0000 mg | Freq: Once | INTRAVENOUS | Status: DC
Start: 2023-11-05 — End: 2023-11-05
  Filled 2023-11-05: qty 10

## 2023-11-05 MED ORDER — PENTAFLUOROPROP-TETRAFLUOROETH EX AERO: 1.0000 | INHALATION_SPRAY | CUTANEOUS | Status: DC | PRN

## 2023-11-05 MED ORDER — LIDOCAINE HCL (PF) 1 % IJ SOLN: 5.0000 mL | INTRAMUSCULAR | Status: DC | PRN

## 2023-11-05 MED ORDER — ACETAMINOPHEN 10 MG/ML IV SOLN
1000.0000 mg | Freq: Once | INTRAVENOUS | Status: AC
Start: 2023-11-05 — End: 2023-11-05
  Administered 2023-11-05: 1000 mg via INTRAVENOUS
  Filled 2023-11-05: qty 100

## 2023-11-05 MED ORDER — DOXERCALCIFEROL 4 MCG/2ML IV SOLN
3.0000 ug | INTRAVENOUS | Status: DC
  Administered 2023-11-07: 3 ug via INTRAVENOUS
  Filled 2023-11-05 (×2): qty 2

## 2023-11-05 MED ORDER — AMLODIPINE BESYLATE 5 MG PO TABS
10.0000 mg | ORAL_TABLET | Freq: Once | ORAL | Status: AC
  Administered 2023-11-05: 10 mg via ORAL
  Filled 2023-11-05: qty 2

## 2023-11-05 MED ORDER — FENTANYL CITRATE PF 50 MCG/ML IJ SOSY
50.0000 ug | PREFILLED_SYRINGE | Freq: Once | INTRAMUSCULAR | Status: AC
  Administered 2023-11-05: 50 ug via INTRAVENOUS
  Filled 2023-11-05: qty 1

## 2023-11-05 MED ORDER — HEPARIN SODIUM (PORCINE) 1000 UNIT/ML DIALYSIS: 1000.0000 [IU] | INTRAMUSCULAR | Status: DC | PRN

## 2023-11-05 MED ORDER — METOCLOPRAMIDE HCL 5 MG/ML IJ SOLN: 5.0000 mg | Freq: Once | INTRAMUSCULAR | Status: DC

## 2023-11-05 MED ORDER — SODIUM ZIRCONIUM CYCLOSILICATE 10 G PO PACK
10.0000 g | PACK | Freq: Once | ORAL | Status: DC
Start: 2023-11-05 — End: 2023-11-06
  Filled 2023-11-05: qty 1

## 2023-11-05 MED ORDER — LORAZEPAM 2 MG/ML IJ SOLN
0.5000 mg | Freq: Once | INTRAMUSCULAR | Status: AC
  Administered 2023-11-05: 0.5 mg via INTRAVENOUS
  Filled 2023-11-05: qty 1

## 2023-11-05 MED ORDER — HYDROMORPHONE HCL 1 MG/ML IJ SOLN
1.0000 mg | Freq: Once | INTRAMUSCULAR | Status: AC
  Administered 2023-11-05: 1 mg via INTRAVENOUS
  Filled 2023-11-05: qty 1

## 2023-11-05 MED ORDER — ANTICOAGULANT SODIUM CITRATE 4% (200MG/5ML) IV SOLN: 5.0000 mL | Status: DC | PRN

## 2023-11-05 MED ORDER — LIDOCAINE-PRILOCAINE 2.5-2.5 % EX CREA: 1.0000 | TOPICAL_CREAM | CUTANEOUS | Status: DC | PRN

## 2023-11-05 MED ORDER — HYDROMORPHONE HCL 1 MG/ML IJ SOLN
0.5000 mg | Freq: Once | INTRAMUSCULAR | Status: AC
  Administered 2023-11-05: 0.5 mg via INTRAVENOUS
  Filled 2023-11-05: qty 1

## 2023-11-05 MED ORDER — LORAZEPAM 2 MG/ML IJ SOLN
1.0000 mg | Freq: Once | INTRAMUSCULAR | Status: AC | PRN
  Administered 2023-11-05: 1 mg via INTRAVENOUS
  Filled 2023-11-05: qty 1

## 2023-11-05 MED ORDER — DEXTROSE 50 % IV SOLN
1.0000 | Freq: Once | INTRAVENOUS | Status: AC
  Administered 2023-11-05: 50 mL via INTRAVENOUS
  Filled 2023-11-05: qty 50

## 2023-11-05 MED ORDER — ONDANSETRON HCL 4 MG/2ML IJ SOLN
4.0000 mg | Freq: Once | INTRAMUSCULAR | Status: AC
  Administered 2023-11-06: 4 mg via INTRAVENOUS
  Filled 2023-11-05: qty 2

## 2023-11-05 MED ORDER — SODIUM BICARBONATE 8.4 % IV SOLN
50.0000 meq | Freq: Once | INTRAVENOUS | Status: AC
  Administered 2023-11-05: 50 meq via INTRAVENOUS
  Filled 2023-11-05: qty 50

## 2023-11-05 MED ORDER — ASPIRIN 81 MG PO CHEW
324.0000 mg | CHEWABLE_TABLET | Freq: Once | ORAL | Status: AC
  Administered 2023-11-05: 324 mg via ORAL
  Filled 2023-11-05: qty 4

## 2023-11-05 MED ORDER — ALTEPLASE 2 MG IJ SOLR
2.0000 mg | Freq: Once | INTRAMUSCULAR | Status: DC | PRN
Start: 2023-11-05 — End: 2023-11-05

## 2023-11-05 MED ORDER — LABETALOL HCL 5 MG/ML IV SOLN
10.0000 mg | Freq: Once | INTRAVENOUS | Status: AC
  Administered 2023-11-05: 10 mg via INTRAVENOUS
  Filled 2023-11-05: qty 4

## 2023-11-05 NOTE — ED Notes (Signed)
 Patient spilt continuous albuterol. PA notified.

## 2023-11-05 NOTE — Progress Notes (Signed)
 Update:  K improved to 5. Lactic acid wnl. vBG wnl.  Patient stopped emesis but still nauseated.   Checked PDMP, patient receives:  Last refill 2/20 Oxy-tylenol 5-325mg  TID - 90 tablets for 30 days Gabapentin 100 mg BID - 60 tablets for 30d  Last filled in 09/2023 Zolpidem 10 mg daily - 30 tablets  Per last HD note through Care Everywhere: last HD on 2/24. Patient has had ongoijg GI symptoms with nausea and vomiting. For more than 4 weeks. Gastroparesis suspected. Patient was scheduled to see PCP at [facility] later that week. We are not able to access records. She did have metoclopramide in her medications but this was discontinued due to akathisia concerns.  There is question about 25 mg Benadryl q6hr and lorazepam 10 mg daily seen on HD note but not concordant with medication dispense history.   Repeat KUB without signs of obstruction. Increased vascular calcifications. Reviewed previous imaging. CT read significant arterial calcifications. When possible, would like to know more about timing of abdominal pain and GI symptoms as patient is at risk for chronic mesenteric ischemia.Will discuss with day team. Also has a 1.3cm exophytic fibroid.  Limited medication options given recent encephalopathy somnolence, and side effect history for suspected gastroparesis.  For now,  - Qtc improved to 480. Will give one dose of zofran - Bedside swallow eval - Will need to verify medications in the Am; risk of withdrawal. Last dose not known -  Supportive management

## 2023-11-05 NOTE — ED Notes (Signed)
 Patient refused CT

## 2023-11-05 NOTE — Progress Notes (Signed)
   11/05/23 1634  Vitals  Pulse Rate 84  Resp 13  BP (!) 195/75  SpO2 99 %  O2 Device Nasal Cannula  Oxygen Therapy  O2 Flow Rate (L/min) 3 L/min  Patient Activity (if Appropriate) In bed  Post Treatment  Dialyzer Clearance Clear  Liters Processed 90  Fluid Removed (mL) 3 mL  Tolerated HD Treatment Yes (patient started crying the las t of treatment after treatment stated she was in pain, nurse aware)

## 2023-11-05 NOTE — H&P (Addendum)
 Date: 11/05/2023               Patient Name:  [name] [name] MRN: [id number]  DOB: [birth year] Age / Sex: 43 y.o., female   [facility]P: [facility]         Medical Service: Internal Medicine Teaching Service         Attending Physician: Dr. Mayford Knife, Dorene Ar, MD      First Contact: Dr. Morrie Sheldon, MD Pager [phone]    Second Contact: Dr. Rudene Christians, DO Pager [phone]         After Hours (After 5p/  First Contact Pager: [phone]  weekends / holidays): Second Contact Pager: [phone]   SUBJECTIVE   Chief Complaint: Abdominal pain in RUQ, headache, chest pain  History of Present Illness:  Ms. Gaber is a 43 year old with 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  History complicated by somnolence and minimal participation in assessment from patient.  Per ED, the patient currently receives dialysis on Monday, Wednesday, and Friday.  Additional history collected from friend Sheria Lang who notes that the patient missed her dialysis session on Friday. He is unsure why she missed dialysis. Since Friday, she had vomiting and diarrhea with right upper quadrant abdominal pain.  The patient's brother called Sheria Lang to go check in on the patient, and he felt that the patient seemed out of it so he called EMS.   ED Course: Initial blood pressure of 216/100 with HR 120.  Troponin 39 -> 36 K 7.5 Creatinine 11.51 BUN 111 Bicarb 18, AG 25 Platelets 105  Given amlodipine, albuterol, aspirin, calcium gluconate  Pain treated with fentanyl 50 mcg x 1, dilaudid 0.5 mg x 1, dilaudid 1 mg x 1  Given ativan 0.5 mg x 1 and 1 mg x 1 for anxiety and Lokelma for hyperkalemia   Past Medical History:  Diagnosis Date   Anemia    Anxiety    ESRD (end stage renal  disease) (HCC)    Henmodialysis   Gastroparesis    Hypertension    Narcotic abuse (HCC)    Non compliance with medical treatment    Renal disorder 2015   transplant   Vision loss    Past Surgical History:  Procedure Laterality Date   A/V FISTULAGRAM Right 02/17/2021   Procedure: A/V FISTULAGRAM;  Surgeon: Cephus Shelling, MD;  Location: [facility] INVASIVE CV LAB;  Service: Cardiovascular;  Laterality: Right;   AV FISTULA PLACEMENT  11/17/2011   Procedure: ARTERIOVENOUS (AV) FISTULA CREATION;  Surgeon: Larina Earthly, MD;  Location: [facility] OR;  Service: Vascular;  Laterality: Right;   BIOPSY  12/03/2021   Procedure: BIOPSY;  Surgeon: Sherrilyn Rist, MD;  Location: [facility] ENDOSCOPY;  Service: Gastroenterology;;   ESOPHAGOGASTRODUODENOSCOPY (EGD) WITH PROPOFOL N/A 12/03/2021   Procedure: ESOPHAGOGASTRODUODENOSCOPY (EGD) WITH PROPOFOL;  Surgeon: Sherrilyn Rist, MD;  Location: [facility] ENDOSCOPY;  Service: Gastroenterology;  Laterality: N/A;   EYE SURGERY     HEMATOMA EVACUATION Right 02/25/2021   Procedure: EVACUATION HEMATOMA RIGHT CHEST;  Surgeon: Maeola Harman, MD;  Location: [facility] OR;  Service: Vascular;  Laterality: Right;   INSERTION OF DIALYSIS CATHETER Right 12/08/2020   Procedure: INSERTION OF TUNNELED DIALYSIS CATHETER;  Surgeon: Cephus Shelling, MD;  Location: [facility] OR;  Service: Vascular;  Laterality: Right;   IR REMOVAL TUN CV CATH W/O FL  05/03/2021   KIDNEY TRANSPLANT     NEPHRECTOMY TRANSPLANTED ORGAN     pancrease transplant     PERIPHERAL VASCULAR BALLOON ANGIOPLASTY Right 02/17/2021   Procedure: PERIPHERAL VASCULAR BALLOON ANGIOPLASTY;  Surgeon: Cephus Shelling, MD;  Location: [facility] INVASIVE CV LAB;  Service: Cardiovascular;  Laterality: Right;   REFRACTIVE SURGERY     REVISON OF ARTERIOVENOUS FISTULA Right 12/08/2020   Procedure: RIGHT ARM ARTERIOVENOUS FISTULA REVISION AND RESECTION;  Surgeon: Cephus Shelling, MD;  Location: [facility] OR;  Service: Vascular;  Laterality: Right;    REVISON OF ARTERIOVENOUS FISTULA Right 02/25/2021   Procedure: RIGHT ARM ARTERIOVENOUS FISTULA REVISION WITH TRANSPOSITION OF CEPHALIC VEIN ON AXILLARY VEIN;  Surgeon: Cephus Shelling, MD;  Location: [facility] OR;  Service: Vascular;  Laterality: Right;   Meds:  Current Outpatient Medications  Medication Instructions   amLODipine (NORVASC) 10 mg, Oral, Daily   aspirin EC 81 mg, Oral, Daily   carvedilol (COREG) 12.5 mg, Oral, 2 times daily with meals   cinacalcet (SENSIPAR) 90 mg, Oral, Daily with supper   cycloSPORINE modified (NEORAL) 200 mg, Oral, 2 times daily   cycloSPORINE modified (NEORAL) 25 mg, Oral, Daily   Darbepoetin Alfa (ARANESP) 60 mcg, Intravenous, Every Fri (Hemodialysis)   doxercalciferol (HECTOROL) 6 mcg, Intravenous, Every M-W-F (Hemodialysis)   DULoxetine (CYMBALTA) 20 mg, Oral, Daily   ferric citrate (AURYXIA) 210-630 mg, Oral, See admin instructions, Take 3 tablets (630 mg) by mouth three times daily with meals, take 1 tablet (210 mg) with snacks   hydrALAZINE (APRESOLINE) 75 mg, Oral, Every 8 hours   hydrOXYzine (VISTARIL) 25 mg, Oral, 3 times daily PRN   lidocaine-prilocaine (EMLA) cream 1 application , Topical, Daily PRN   Lokelma 10 g, Oral, See admin instructions, Take one packet (10 g) by mouth (dissolved in water) daily on Non-Dialysis (Sun,Tues, Thurs, Sat)   losartan (COZAAR) 100 mg, Oral, Daily   metoCLOPramide (REGLAN) 5 mg, Oral, Every 6 hours PRN   mycophenolate (MYFORTIC) 540 mg, Oral, 2 times daily   ondansetron (ZOFRAN) 4 mg, Oral, Every 8 hours PRN   oxyCODONE-acetaminophen (PERCOCET/ROXICET) 5-325 MG tablet 1 tablet, Oral, 3 times daily   pantoprazole (PROTONIX) 40 mg, Oral, Daily   predniSONE (DELTASONE) 15 mg, Oral, Daily   zolpidem (AMBIEN) 5 mg, Oral, Daily at bedtime   Social:  Lives With: Alone  Occupation: Unable to collect   Support: Independent of ADLs and iADLs  PCP: [employer] Substances: Unable to collect but per chart  review noted to have history of opioid misuse disorder  Family History  Problem Relation Age of Onset   Hypertension Father     Allergies: NKDA  Review of Systems: A complete ROS was negative except as per HPI.   OBJECTIVE:  Labs: CBC    Component Value Date/Time   WBC 4.2 11/05/2023 0343   RBC 4.36 11/05/2023 0343   HGB 13.0 11/05/2023 0343   HCT 40.5 11/05/2023 0343   PLT 105 (L) 11/05/2023 0343   MCV 92.9 11/05/2023 0343   MCH 29.8 11/05/2023 0343   MCHC 32.1 11/05/2023 0343   RDW 13.2 11/05/2023 0343   LYMPHSABS 1.0 11/05/2023 0343   MONOABS 0.2 11/05/2023 0343   EOSABS 0.1 11/05/2023 0343   BASOSABS 0.0 11/05/2023 0343    CMP     Component Value Date/Time   NA 138 11/05/2023 0343   K >7.5 (HH) 11/05/2023  0343   CL 95 (L) 11/05/2023 0343   CO2 18 (L) 11/05/2023 0343   GLUCOSE 80 11/05/2023 0343   BUN 111 (H) 11/05/2023 0343   CREATININE 11.51 (H) 11/05/2023 0343   CALCIUM 9.8 11/05/2023 0343   PROT 7.9 11/05/2023 0343   ALBUMIN 4.2 11/05/2023 0343   AST 11 (L) 11/05/2023 0343   ALT 10 11/05/2023 0343   ALKPHOS 91 11/05/2023 0343   BILITOT 1.2 11/05/2023 0343   GFRNONAA 4 (L) 11/05/2023 0343   GFRAA  02/02/2022 0454    PATIENT IDENTIFICATION ERROR. PLEASE DISREGARD RESULTS. ACCOUNT WILL BE CREDITED.   Physical Exam Constitutional:      Appearance: Normal appearance.     Comments: Too encephalopathic to participate for most of assessment  HENT:     Head: Normocephalic and atraumatic.  Cardiovascular:     Rate and Rhythm: Normal rate.     Heart sounds: Murmur heard.  Pulmonary:     Effort: Pulmonary effort is normal.  Abdominal:     General: Abdomen is flat. Bowel sounds are normal.     Palpations: Abdomen is soft.  Skin:    General: Skin is warm.  Neurological:     Mental Status: She is lethargic.     Comments: Able to localize painful stimuli; pinpoint pupils but corneal reflex intact   Psychiatric:        Behavior: Behavior is uncooperative.    Imaging: CT head IMPRESSION: 1. No evidence of an acute intracranial abnormality. 2. Parenchymal atrophy, chronic small vessel ischemic disease and chronic infarcts, as described.  CT abd pelvis IMPRESSION: 1. No acute finding or change from 09/26/2023. 2. Cholelithiasis and other chronic findings as described.  EKG: Sinus tachycardia  ASSESSMENT & PLAN:   Assessment & Plan by Problem: Principal Problem:   Acute encephalopathy  Ms. Fritcher is a 43 year old with past medical history of renal/ pancreas transplant with subsequent transplant failure, now ESRD on HD MWF, type 1 diabetes mellitus, and history of ischemic stroke (11/2021) presented to the ED with abdominal pain, chest pain, and headache and admitted for acute encephalopathy.  #Acute Encephalopathy #ESRD #Hyperkalemia #Elevated BUN  Nephrology consulted and following.  Presented with 3-day history of nausea and vomiting in context of missed dialysis. Currently receives HD on M/W/F. Unclear why patient missed her dialysis but per chart review, noted to have been admitted multiple times in the past for missed dialysis with subsequent acute encephalopathy.  Elevated potassium and BUN likely in context of missed dialysis.  Likely will need emergent dialysis given metabolic abnormalities and encephalopathy with elevated BUN.  Received Lokelma in the ED so we will follow-up with afternoon potassium.  Will also reevaluate patient after HD to see if encephalopathy has resolved. - Follow-up nephrology - Follow-up potassium and renal function panel - Follow-up VBG  #Hx of Pancreatic/Renal Transplant, 2015  #Hx of Failed Renal Transplant, 2019 Unable to verify but per dispense history, patient unlikely to be adherent to her immunosuppression.  Etiology may be euglycemic DKA given altered mental status and history of nonadherence.  However, lipase and glucose were unremarkable but will follow-up with beta hydroxybutyrate. - Follow-up  beta hydroxybutyrate  #Elevated tropes Presented with chest pain.  Tropes elevated at 39 but plateaued at 36.  Per ED, pain resolved after pain meds but will continue to monitor.  #Headache CT head unremarkable for any acute abnormalities. Will re-evaluate after resolution of encephalopathy.  #Abdominal Pain Per ED, improved after pain meds. CT abdomen  unremarkable for any acute findings or changes from 09/26/23. RUQ from 09/26/23 notable for cholelithiasis but no secondary signs for cholecystitis.   Chronic Conditions:  #Thrombocytopenia: Slightly decreased at 105.  Baseline roughly 110-120.  Will continue to monitor CBC #Hypertension: BP elevated in ED. Unknown if patient has been taking her home meds. Per chart review, patient prescribed amlodipine 10 mg daily, Coreg 12.5 mg BID, hydralazine 25 mg TID, losartan 100 mg daily.  #Anxiety: Per chart review, patient takes Ambien 5 mg at nighttime, duloxetine 20 mg daily, hydroxyzine 25 mg TID PRN  Diet: NPO VTE: Heparin IVF: None,None Code: Full  Prior to Admission Living Arrangement: Home, living alone Anticipated Discharge Location: Home Barriers to Discharge: Resolution of encephalopathy   Dispo: Admit patient to Observation with expected length of stay less than 2 midnights.  Signed: Morrie Sheldon, MD Internal Medicine Resident PGY-1  11/05/2023, 10:18 AM

## 2023-11-05 NOTE — ED Triage Notes (Signed)
 Patient coming from home. EMS was called out for abdominal pain in upper right quad that started tonight with vomiting and diarrhea. Missed dialysis Friday, has it on MWF. Small amounts of swelling in her bilateral legs. Patient extremely anxious upon arrival to ED.  EMS VS 216/110 120HR 93% RA 100F temp 91 CBG Took prescribed oxycodone

## 2023-11-05 NOTE — ED Notes (Signed)
 Resident looking for pt. Pt still not in room.

## 2023-11-05 NOTE — ED Provider Notes (Signed)
 Accepted handoff at shift change from [facility]. Please see prior provider note for full HPI.  Briefly: Patient is a 43 y.o. female who presents to the ER for headache, exertional chest pain, and RUQ abdominal pain. Hx of ESRD on hemodialysis MWF, missed Friday's session, unsure when her last session was. Hyperkalemic to > 7.5 with EKG with some peaked T waves, prolonged QT (chronic). Tachycardic at 115 bpm, hypertensive to 194/127.   DDX/Plan: Waiting on CT scans and delta troponin. Nephrology consulted for emergent dialysis.   Pt has received lokelma, insulin, sodium bicarbonate, calcium gluconate, and albuterol for hyperkalemia.   Physical Exam  BP (!) 167/74   Pulse 99   Temp 97.7 F (36.5 C) (Oral)   Resp 17   LMP 09/18/2023   SpO2 94%   Physical Exam Vitals and nursing note reviewed.  Constitutional:      Appearance: Normal appearance.  HENT:     Head: Normocephalic and atraumatic.  Eyes:     Conjunctiva/sclera: Conjunctivae normal.  Cardiovascular:     Rate and Rhythm: Normal rate.  Pulmonary:     Effort: Pulmonary effort is normal. No respiratory distress.  Skin:    General: Skin is warm and dry.  Neurological:     Mental Status: She is alert.  Psychiatric:        Mood and Affect: Mood normal.        Behavior: Behavior normal.    Results  CT ABDOMEN PELVIS WO CONTRAST Result Date: 11/05/2023 CLINICAL DATA:  Right upper quadrant pain.  Missed dialysis. EXAM: CT ABDOMEN AND PELVIS WITHOUT CONTRAST TECHNIQUE: Multidetector CT imaging of the abdomen and pelvis was performed following the standard protocol without IV contrast. RADIATION DOSE REDUCTION: This exam was performed according to the departmental dose-optimization program which includes automated exposure control, adjustment of the mA and/or kV according to patient size and/or use of iterative reconstruction technique. COMPARISON:  09/26/2023 FINDINGS: Lower chest: Cardiomegaly. Extensive atheromatous  calcification of the coronaries. Haziness at the lung bases is chronic when compared to prior. Hepatobiliary: No focal liver abnormality.Full gallbladder, calcified gallstones were better seen on prior given motion today. No pericholecystic edema detected. No bile duct dilatation. Pancreas: Native pancreas atrophy. 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 Spleen: Chronic enlargement without focal abnormality. Adrenals/Urinary Tract: Negative adrenals. Severe native renal atrophy, no hydronephrosis or inflammation detected. Unchanged appearance of atrophic transplant kidney in the right lower quadrant with superimposed cortical cysts. Collapsed urinary bladder without focal abnormality. 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 No superimposed acute finding. Vascular/Lymphatic: Extensive arterial calcification. No mass or adenopathy. Reproductive:Posterior uterine fibroid. Presumed follicle in the left ovary where there is asymmetric cystic density. Other: No ascites or pneumoperitoneum. Musculoskeletal: No acute abnormalities. 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 Presumed hemangioma in the T9 vertebral body. IMPRESSION: 1. No acute finding or change from 09/26/2023. 2. Cholelithiasis and other chronic findings as described. Electronically Signed   By: Tiburcio Pea M.D.   On: 11/05/2023 08:54   CT Head Wo Contrast Result Date: 11/05/2023 CLINICAL DATA:  Provided history: Headache, increasing frequency or severity. EXAM: CT HEAD WITHOUT CONTRAST TECHNIQUE: Contiguous axial images were obtained from the base of the skull through the vertex without intravenous contrast. RADIATION DOSE REDUCTION: This exam was performed according to the departmental dose-optimization program which includes automated exposure control, adjustment of the mA and/or kV according to patient size  and/or  use of iterative reconstruction technique. COMPARISON:  Head CT 01/30/2022.  Brain MRI 12/24/2021. FINDINGS: Brain: Generalized cerebral atrophy. Patchy and ill-defined hypoattenuation within the cerebral white matter, nonspecific but compatible with mild chronic small vessel ischemic disease. Chronic lacunar infarcts within the right thalamus and right aspect of the pons, some of which were better appreciated on the prior brain MRI of 12/24/2021. Chronic infarct again demonstrated within the inferomedial aspect of the left cerebellar hemisphere. Small infarct within the superomedial right cerebellar hemisphere, new from the prior head CT of 01/30/2022 but chronic in appearance (series 5, image 10). There is no acute intracranial hemorrhage. No demarcated cortical infarct. No extra-axial fluid collection. No evidence of an intracranial mass. No midline shift. Vascular: No hyperdense vessel.  Atherosclerotic calcifications. Skull: No calvarial fracture or aggressive osseous lesion. Sinuses/Orbits: No mass or acute finding within the imaged orbits. No significant paranasal sinus disease at the imaged levels. IMPRESSION: 1. No evidence of an acute intracranial abnormality. 2. Parenchymal atrophy, chronic small vessel ischemic disease and chronic infarcts, as described. Electronically Signed   By: Jackey Loge D.O.   On: 11/05/2023 08:42   ED Course / MDM   Clinical Course as of 11/05/23 1008  Mon Nov 05, 2023  0517 Dr. Clayborne Dana evaluated patient, recommends holding off on any blood pressure medications until scans are back. Will start patient on calcium gluconate, bicarb, insulin with dextrose drip, and albuterol for her hyperkalemia per Dr. Clayborne Dana [AF]  [phone] Upon reevaluation, patient still feels like she cannot go due to CT due to feeling nauseous.  Nurse also reported that the patient spilled the albuterol inhaler.  Will reorder this medication for her. [AF]  0700 Nephrology recommended adding 10mg   Lokelma and will plan on seeing patient in consult for dialysis [AF]    Clinical Course User Index [AF] Arabella Merles, PA-C   Medical Decision Making Amount and/or Complexity of Data Reviewed Labs: ordered. Radiology: ordered.  Risk OTC drugs. Prescription drug management. Decision regarding hospitalization.  0915 -- Delta troponin stable at 36. On reevaluation after IV BP meds, BP significantly improved, patient sleeping, pain improved. CT's of head and abdomen unremarkable. Will plan for unassigned medical admission.   53 -- Consulted with internal medicine teaching service who will admit under attending Dr Mayford Knife. The patient appears reasonably stabilized for admission considering the current resources, flow, and capabilities available in the ED at this time, and I doubt any other [facility] requiring further screening and/or treatment in the ED prior to admission.   Jeanella Flattery 11/05/23 1008    Lonell Grandchild, MD 11/05/23 1136

## 2023-11-05 NOTE — Progress Notes (Signed)
   11/05/23 1613  Vitals  Temp (!) 97.1 F (36.2 C)  Pulse Rate 86  Resp 15  BP (!) 181/62  SpO2 100 %  O2 Device Nasal Cannula  Oxygen Therapy  O2 Flow Rate (L/min) 3 L/min  Patient Activity (if Appropriate) In bed  During Treatment Monitoring  Blood Flow Rate (mL/min) 400 mL/min (Simultaneous filing. User may not have seen previous data.)  Arterial Pressure (mmHg) -151.1 mmHg (Simultaneous filing. User may not have seen previous data.)  Venous Pressure (mmHg) 337.16 mmHg (Simultaneous filing. User may not have seen previous data.)  TMP (mmHg) 12.73 mmHg (Simultaneous filing. User may not have seen previous data.)  Ultrafiltration Rate (mL/min) 1052 mL/min (Simultaneous filing. User may not have seen previous data.)  Dialysate Flow Rate (mL/min) 299 ml/min (Simultaneous filing. User may not have seen previous data.)  Dialysate Potassium Concentration 2  Dialysate Calcium Concentration 2.5  Duration of HD Treatment -hour(s) 3.75 hour(s) (Simultaneous filing. User may not have seen previous data.)  Cumulative Fluid Removed (mL) per Treatment  3000.22 (Simultaneous filing. User may not have seen previous data.)  HD Safety Checks Performed Yes  Intra-Hemodialysis Comments Tx completed   Received patient in bed to unit.  Alert and oriented.  Informed consent signed and in chart.   TX duration:3.75  Patient tolerated well.  Transported back to the room  Alert, without acute distress.  Hand-off given to patient's nurse.   Access used: Yes Access issues: No  Total UF removed: 3000 Medication(s) given: See MAR Post HD VS: See Above Grid Post HD weight: Unable to get a weight, pt on a non-weight bed   [facility]

## 2023-11-05 NOTE — ED Notes (Signed)
 After consulting Arabella Merles, PA-C regarding the patient's CBG of 89, she verbally order to run the dextrose infusion for 30 minutes then recheck CBG before giving insulin.

## 2023-11-05 NOTE — ED Notes (Signed)
 Pt asleep for assessment.

## 2023-11-05 NOTE — ED Notes (Signed)
 Pt still not in room.

## 2023-11-05 NOTE — ED Provider Notes (Cosign Needed Addendum)
 [place] EMERGENCY DEPARTMENT AT [facility] Provider Note   CSN: [id number] Arrival date & time: 11/05/23  4098     History  Chief Complaint  Patient presents with   Abdominal Pain    [name] [name] is a 43 y.o. female with history of end-stage renal disease on Monday Wednesday Friday dialysis, hypertension, presents with concern for headache, right upper quadrant pain, and chest pain that started yesterday.  She states she missed her Saturday dialysis, unsure when her last dialysis session was.  Reports chest pain does worsen with exertion.  Denies any radiation of the pain to her back or pain with inspiration. Reports some lower extremity swelling.   HPI     Home Medications Prior to Admission medications   Medication Sig Start Date End Date Taking? Authorizing Provider  amLODipine (NORVASC) 10 MG tablet Take 1 tablet (10 mg total) by mouth daily. Patient taking differently: Take 10 mg by mouth at bedtime. 02/05/22 06/13/23  Lorin Glass, MD  aspirin EC 81 MG tablet Chew 81 mg by mouth daily.    [provider]  carvedilol (COREG) 12.5 MG tablet Take 1 tablet (12.5 mg total) by mouth 2 (two) times daily with a meal. 02/05/22 06/13/23  Dahal, Melina Schools, MD  cinacalcet (SENSIPAR) 30 MG tablet Take 3 tablets (90 mg total) by mouth daily with supper. 06/14/23   Arnetha Courser, MD  cycloSPORINE modified (NEORAL) 100 MG capsule Take 2 capsules (200 mg total) by mouth 2 (two) times daily. 12/25/21   Pokhrel, Rebekah Chesterfield, MD  cycloSPORINE modified (NEORAL) 25 MG capsule Take 25 mg by mouth daily.    [provider]  Darbepoetin Alfa (ARANESP) 60 MCG/0.3ML SOSY injection Inject 0.3 mLs (60 mcg total) into the vein every Friday with hemodialysis. 11/18/21   Lonia Blood, MD  doxercalciferol (HECTOROL) 4 MCG/2ML injection Inject 3 mLs (6 mcg total) into the vein every Monday, Wednesday, and Friday with hemodialysis. 11/17/21   Lonia Blood, MD  DULoxetine  (CYMBALTA) 20 MG capsule Take 1 capsule (20 mg total) by mouth daily. Patient not taking: Reported on 06/13/2023 02/06/22 05/07/22  Lorin Glass, MD  ferric citrate (AURYXIA) 1 GM 210 MG(Fe) tablet Take 210-630 mg by mouth See admin instructions. Take 3 tablets (630 mg) by mouth three times daily with meals, take 1 tablet (210 mg) with snacks    [provider]  hydrALAZINE (APRESOLINE) 25 MG tablet Take 3 tablets (75 mg total) by mouth every 8 (eight) hours. 02/05/22 06/13/23  Lorin Glass, MD  hydrOXYzine (VISTARIL) 25 MG capsule Take 25 mg by mouth 3 (three) times daily as needed for anxiety. 05/26/23   [provider]  lidocaine-prilocaine (EMLA) cream Apply 1 application  topically daily as needed (prior to port access). 07/09/20   [provider]  losartan (COZAAR) 100 MG tablet Take 1 tablet (100 mg total) by mouth daily. 12/04/21 06/13/23  [place], Alvira Philips, DO  metoCLOPramide (REGLAN) 5 MG tablet Take 5 mg by mouth every 6 (six) hours as needed for nausea or vomiting.    [provider]  mycophenolate (MYFORTIC) 180 MG EC tablet Take 540 mg by mouth 2 (two) times daily.    [provider]  ondansetron (ZOFRAN) 4 MG tablet Take 4 mg by mouth every 8 (eight) hours as needed for nausea or vomiting.    [provider]  oxyCODONE-acetaminophen (PERCOCET/ROXICET) 5-325 MG tablet Take 1 tablet by mouth 3 (three) times daily.    [provider]  pantoprazole (PROTONIX) 40 MG tablet Take 1 tablet (40 mg total) by mouth daily. 02/06/22 05/07/22  Lorin Glass, MD  predniSONE (DELTASONE) 5 MG tablet Take 15 mg by mouth daily. 06/24/15   [provider]  sodium zirconium cyclosilicate (LOKELMA) 10 g PACK packet Take 10 g by mouth See admin instructions. Take one packet (10 g) by mouth (dissolved in water) daily on Non-Dialysis (Sun,Tues, Thurs, Sat)    [provider]  zolpidem (AMBIEN) 5 MG tablet Take 5 mg by mouth at bedtime.    [provider]      Allergies    Patient has no known allergies.    Review of Systems   Review of Systems  Cardiovascular:  Positive for chest pain.  Gastrointestinal:  Positive for abdominal pain.  Neurological:  Positive for headaches.    Physical Exam Updated Vital Signs BP (!) 194/127   Pulse (!) 115   Temp 97.6 F (36.4 C) (Oral)   Resp 20   LMP 09/18/2023   SpO2 100%  Physical Exam Vitals and nursing note reviewed.  Constitutional:      General: She is not in acute distress.    Appearance: She is well-developed.     Comments: Very anxious appearing upon arrival, having difficulty answering questions  Face is twitching  HENT:     Head: Normocephalic and atraumatic.  Eyes:     Extraocular Movements: Extraocular movements intact.     Conjunctiva/sclera: Conjunctivae normal.     Pupils: Pupils are equal, round, and reactive to light.  Cardiovascular:     Rate and Rhythm: Regular rhythm. Tachycardia present.     Heart sounds: Murmur heard.  Pulmonary:     Effort: Pulmonary effort is normal. No respiratory distress.     Breath sounds: Normal breath sounds.  Abdominal:     Palpations: Abdomen is soft.     Tenderness: There is abdominal tenderness.     Comments: Diffusely tender to palpation  Musculoskeletal:        General: No swelling.     Cervical back: Neck supple.  Skin:    General: Skin is warm and dry.     Capillary Refill: Capillary refill takes less than 2 seconds.  Neurological:     General: No focal deficit present.     Mental Status: She is alert.     Comments: Able to smile, puff cheeks, raise eyebrows symmetrically  No drift in the upper or lower extremities bilaterally  5/5 strength in the bilateral upper and lower extremities  Intact sensation in the bilateral upper and lower extremities  Psychiatric:        Mood and Affect: Mood normal.     ED Results / Procedures / Treatments   Labs (all labs ordered are listed, but only abnormal  results are displayed) Labs Reviewed  CBC WITH DIFFERENTIAL/PLATELET - Abnormal; Notable for the following components:      Result Value   Platelets 105 (*)    All other components within normal limits  COMPREHENSIVE METABOLIC PANEL - Abnormal; Notable for the following components:   Potassium >7.5 (*)    Chloride 95 (*)    CO2 18 (*)    BUN 111 (*)    Creatinine, Ser 11.51 (*)    AST 11 (*)    GFR, Estimated 4 (*)    Anion gap 25 (*)    All other components within normal limits  TROPONIN I (HIGH SENSITIVITY) - Abnormal; Notable for the  following components:   Troponin I (High Sensitivity) 39 (*)    All other components within normal limits  LIPASE, BLOOD  HCG, QUANTITATIVE, PREGNANCY  CBG MONITORING, ED  TROPONIN I (HIGH SENSITIVITY)    EKG EKG Interpretation Date/Time:  Monday November 05 2023 03:22:11 EST Ventricular Rate:  119 PR Interval:  105 QRS Duration:  113 QT Interval:  378 QTC Calculation: 532 R Axis:   -89  Text Interpretation: Sinus or ectopic atrial tachycardia Left anterior fascicular block Low voltage, precordial leads Consider anterior infarct ST elevation, consider inferior injury Prolonged QT interval Confirmed by Marily Memos [phone]) on 11/05/2023 4:37:33 AM  Radiology No results found.  Procedures .Critical Care  Performed by: Arabella Merles, PA-C Authorized by: Arabella Merles, PA-C   Critical care provider statement:    Critical care time (minutes):  30   Critical care was necessary to treat or prevent imminent or life-threatening deterioration of the following conditions:  Metabolic crisis and renal failure   Critical care was time spent personally by me on the following activities:  Development of treatment plan with patient or surrogate, discussions with consultants, evaluation of patient's response to treatment, examination of patient, ordering and review of laboratory studies, ordering and review of radiographic studies, ordering and  performing treatments and interventions, pulse oximetry, re-evaluation of patient's condition and review of old charts     Medications Ordered in ED Medications  insulin aspart (novoLOG) injection 5 Units (has no administration in time range)    And  dextrose 50 % solution 50 mL (has no administration in time range)  LORazepam (ATIVAN) injection 1 mg (has no administration in time range)  albuterol (PROVENTIL) (2.5 MG/3ML) 0.083% nebulizer solution 10 mg (has no administration in time range)  fentaNYL (SUBLIMAZE) injection 50 mcg (50 mcg Intravenous Given 11/05/23 0418)  LORazepam (ATIVAN) injection 0.5 mg (0.5 mg Intravenous Given 11/05/23 0455)  calcium gluconate 1 g/ 50 mL sodium chloride IVPB (0 mg Intravenous Stopped 11/05/23 0557)  aspirin chewable tablet 324 mg (324 mg Oral Given 11/05/23 0502)  HYDROmorphone (DILAUDID) injection 0.5 mg (0.5 mg Intravenous Given 11/05/23 0505)  albuterol (PROVENTIL) (2.5 MG/3ML) 0.083% nebulizer solution 10 mg (10 mg Nebulization Given 11/05/23 0559)  sodium bicarbonate injection 50 mEq (50 mEq Intravenous Given 11/05/23 0630)  dextrose 10 % infusion ( Intravenous New Bag/Given 11/05/23 0635)  metoprolol tartrate (LOPRESSOR) injection 5 mg (5 mg Intravenous Given 11/05/23 0626)  HYDROmorphone (DILAUDID) injection 1 mg (1 mg Intravenous Given 11/05/23 9147)    ED Course/ Medical Decision Making/ A&P Clinical Course as of 11/05/23 0701  Mon Nov 05, 2023  0517 Dr. Clayborne Dana evaluated patient, recommends holding off on any blood pressure medications until scans are back. Will start patient on calcium gluconate, bicarb, insulin with dextrose drip, and albuterol for her hyperkalemia per Dr. Clayborne Dana [AF]  [phone] Upon reevaluation, patient still feels like she cannot go due to CT due to feeling nauseous.  Nurse also reported that the patient spilled the albuterol inhaler.  Will reorder this medication for her. [AF]  0700 Nephrology recommended adding 10mg  Lokelma and will plan on  seeing patient in consult for dialysis [AF]    Clinical Course User Index [AF] Arabella Merles, PA-C                                 Medical Decision Making Amount and/or Complexity of Data Reviewed Labs: ordered. Radiology: ordered.  Risk  OTC drugs. Prescription drug management.     Differential diagnosis includes but is not limited to Electrolyte abnormality, ESRD requiring dialysis, Cholelithiasis, cholangitis, choledocholithiasis, peptic ulcer, gastritis, gastroenteritis, appendicitis, IBS, IBD, DKA, nephrolithiasis, UTI, pyelonephritis, pancreatitis, diverticulitis, mesenteric ischemia, abdominal aortic aneurysm, small bowel obstruction, volvulus, testicular torsion in males, ovarian torsion and pregnancy related concerns in females of childbearing age  ACS, arrhythmia, aortic aneurysm, pericarditis, myocarditis, pericardial effusion, cardiac tamponade, musculoskeletal pain, GERD, Boerhaave's syndrome, DVT/PE, pneumonia, pleural effusion, Intracranial abnormality, stroke, TIA    ED Course:  Patient very uncomfortable appearing upon initial arrival and very anxious.  She is a tachycardia in the 110s and 120s upon arrival.  Elevated blood pressure at 216/124.  She reports right upper quadrant abdominal pain and is concerned to palpation diffusely in the abdomen.  Also reporting chest pain that is exertional and headache that started last night. I Ordered, and personally interpreted labs.  The pertinent results include:   Initial troponin of 39, repeat pending CMP with potassium over 7.5.  Creatinine 11.5, anion gap 25 CBC without leukocytosis Pregnancy negative Lipase within normal limits Patient initially given fentanyl and dilaudid for pain.  Was found to have prolonged QT on EKG, will hold off on Zofran for nausea.  Was given Ativan for nausea.   Patient's potassium over 7.5. EKG with peaked T waves and prolonged QTc of 532. It does appear that she had prolonged QT back on  EKG in January 2025. She was started on calcium gluconate, sodium bicarb, albuterol.  Her glucose is on the lower side at 80.  Will give dextrose infusion and insulin as well.  I placed a nephrology consult, awaiting phone call back. Patient was reporting chest pain so troponin was obtained.  It was elevated at 39, awaiting repeat.  There is some concern for ACS at this time if troponin continues to elevate.  Given reported chest pain, given 324 mg chewable aspirin Upon reevaluation, patient still reporting continued pain and did not want to get CT due to pain.  Given additional 1 mg Dilaudid for pain.  I discussed that we will need to get the CT scans for further evaluation with the patient.   Addendum 7:02 AM: Spoke to nephrologist Dr. Arrie Aran, they will plan on seeing patient in consult for dialysis   Impression: Hyperkalemia due to missed dialysis Chest pain Right upper quadrant abdominal pain Headache  Disposition:  Care of this patient signed out to oncoming ED provider Roemhildt, PA-C to follow up on CT head and CT abdomen pelvis.  Follow-up on nephrology consult, patient will need inpatient dialysis due to her severe hyperkalemia today.  Follow up on repeat troponin; if there is an upward trend may need to consult cardiology for possible NSTEMI. May need medical admission given her persistent tachycardia and pain. Disposition and treatment plan pending imaging results and clinical judgment of oncoming ED team.    Imaging Studies ordered: I ordered imaging studies including CT head, CT abdomen pelvis, awaiting results   Cardiac Monitoring: / EKG: The patient was maintained on a cardiac monitor.  I personally viewed and interpreted the cardiac monitored which showed an underlying rhythm of: Sinus tachycardia with prolonged QT. Appears to have ST depression in V6   Consultations Obtained: I requested consultation with the nephrologist, awaiting  callback                Final Clinical Impression(s) / ED Diagnoses Final diagnoses:  Right upper quadrant abdominal pain  Other headache syndrome  Chest pain, unspecified type  Hyperkalemia    Rx / DC Orders ED Discharge Orders     None         Arabella Merles, Cordelia Poche 11/05/23 1610    Marily Memos, MD 11/05/23 0656    Arabella Merles, PA-C 11/05/23 9604    Marily Memos, MD 11/06/23 [phone]

## 2023-11-05 NOTE — ED Notes (Signed)
 This RN reported to PA Roemhildt that patient still on dextrose 10% infusion currently and if she wanted infusion to be stopped or continue.  PA Roemhildt to let infusion continue at this time until inpatient attendings see her.

## 2023-11-05 NOTE — Consult Note (Signed)
 Renal Service Consult Note [facility]  [name] [name] 11/05/2023 [name] Krabbe, MD Requesting Physician: Dr. Wilfred Lacy  Reason for Consult: ESRD pt w/ hyperkalamia, severe HPI: The patient is a 43 y.o. year-old w/ PMH as below who presented to ED this am early c/o abd pain and vomiting w/ diarrhea. Missed HD last Friday 2/28. Mild swelling in legs. Very anxious per RN in ED. BP in ED 210/100, HR 120, K > 7.5, creat 11.5. BUN 111.  Pt rec'd temporizing measures for severe Kirtland Bouchard, we were asked to see for dialysis. Pt also rec'd several IV pain meds and ativan IV for anxiety.    Pt seen in ED room.  Pt is sedated after IV pain and anxiety meds, no hx obtained. Lying flat and not requiring O2, post temporizing telemetry patterns shows a tight QRS.    PMH Hx panc/ renal transplant w/ transplant failure ESRD on MWF HD DM type 1 H/o ischemic CVA 2023 Vision loss Narcotic abuse Gastroparesis Anxiety    ROS - denies CP, no joint pain, no HA, no blurry vision, no rash, no diarrhea, no nausea/ vomiting, no dysuria, no difficulty voiding   Past Medical History  Past Medical History:  Diagnosis Date   Anemia    Anxiety    ESRD (end stage renal disease) (HCC)    Henmodialysis   Gastroparesis    Hypertension    Narcotic abuse (HCC)    Non compliance with medical treatment    Renal disorder 2015   transplant   Vision loss    Past Surgical History  Past Surgical History:  Procedure Laterality Date   A/V FISTULAGRAM Right 02/17/2021   Procedure: A/V FISTULAGRAM;  Surgeon: Cephus Shelling, MD;  Location: [facility] INVASIVE CV LAB;  Service: Cardiovascular;  Laterality: Right;   AV FISTULA PLACEMENT  11/17/2011   Procedure: ARTERIOVENOUS (AV) FISTULA CREATION;  Surgeon: Larina Earthly, MD;  Location: [facility] OR;  Service: Vascular;  Laterality: Right;   BIOPSY  12/03/2021   Procedure: BIOPSY;  Surgeon: Sherrilyn Rist, MD;  Location: [facility] ENDOSCOPY;  Service:  Gastroenterology;;   ESOPHAGOGASTRODUODENOSCOPY (EGD) WITH PROPOFOL N/A 12/03/2021   Procedure: ESOPHAGOGASTRODUODENOSCOPY (EGD) WITH PROPOFOL;  Surgeon: Sherrilyn Rist, MD;  Location: [facility] ENDOSCOPY;  Service: Gastroenterology;  Laterality: N/A;   EYE SURGERY     HEMATOMA EVACUATION Right 02/25/2021   Procedure: EVACUATION HEMATOMA RIGHT CHEST;  Surgeon: Maeola Harman, MD;  Location: [facility] OR;  Service: Vascular;  Laterality: Right;   INSERTION OF DIALYSIS CATHETER Right 12/08/2020   Procedure: INSERTION OF TUNNELED DIALYSIS CATHETER;  Surgeon: Cephus Shelling, MD;  Location: [facility] OR;  Service: Vascular;  Laterality: Right;   IR REMOVAL TUN CV CATH W/O FL  05/03/2021   KIDNEY TRANSPLANT     NEPHRECTOMY TRANSPLANTED ORGAN     pancrease transplant     PERIPHERAL VASCULAR BALLOON ANGIOPLASTY Right 02/17/2021   Procedure: PERIPHERAL VASCULAR BALLOON ANGIOPLASTY;  Surgeon: Cephus Shelling, MD;  Location: [facility] INVASIVE CV LAB;  Service: Cardiovascular;  Laterality: Right;   REFRACTIVE SURGERY     REVISON OF ARTERIOVENOUS FISTULA Right 12/08/2020   Procedure: RIGHT ARM ARTERIOVENOUS FISTULA REVISION AND RESECTION;  Surgeon: Cephus Shelling, MD;  Location: [facility] OR;  Service: Vascular;  Laterality: Right;   REVISON OF ARTERIOVENOUS FISTULA Right 02/25/2021   Procedure: RIGHT ARM ARTERIOVENOUS FISTULA REVISION WITH TRANSPOSITION OF CEPHALIC VEIN ON AXILLARY VEIN;  Surgeon: Cephus Shelling, MD;  Location: [facility] OR;  Service: Vascular;  Laterality: Right;   Family History  Family History  Problem Relation Age of Onset   Hypertension Father    Social History  reports that she has quit smoking. Her smoking use included pipe. She has never used smokeless tobacco. She reports that she does not drink alcohol and does not use drugs. Allergies No Known Allergies Home medications Prior to Admission medications   Medication Sig Start Date End Date Taking? Authorizing Provider  amLODipine  (NORVASC) 10 MG tablet Take 1 tablet (10 mg total) by mouth daily. Patient taking differently: Take 10 mg by mouth at bedtime. 02/05/22 06/13/23  Lorin Glass, MD  aspirin EC 81 MG tablet Chew 81 mg by mouth daily.    [provider]  carvedilol (COREG) 12.5 MG tablet Take 1 tablet (12.5 mg total) by mouth 2 (two) times daily with a meal. 02/05/22 06/13/23  Dahal, Melina Schools, MD  cinacalcet (SENSIPAR) 30 MG tablet Take 3 tablets (90 mg total) by mouth daily with supper. 06/14/23   Arnetha Courser, MD  cycloSPORINE modified (NEORAL) 100 MG capsule Take 2 capsules (200 mg total) by mouth 2 (two) times daily. 12/25/21   Pokhrel, Rebekah Chesterfield, MD  cycloSPORINE modified (NEORAL) 25 MG capsule Take 25 mg by mouth daily.    [provider]  Darbepoetin Alfa (ARANESP) 60 MCG/0.3ML SOSY injection Inject 0.3 mLs (60 mcg total) into the vein every Friday with hemodialysis. 11/18/21   Lonia Blood, MD  doxercalciferol (HECTOROL) 4 MCG/2ML injection Inject 3 mLs (6 mcg total) into the vein every Monday, Wednesday, and Friday with hemodialysis. 11/17/21   Lonia Blood, MD  DULoxetine (CYMBALTA) 20 MG capsule Take 1 capsule (20 mg total) by mouth daily. Patient not taking: Reported on 06/13/2023 02/06/22 05/07/22  Lorin Glass, MD  ferric citrate (AURYXIA) 1 GM 210 MG(Fe) tablet Take 210-630 mg by mouth See admin instructions. Take 3 tablets (630 mg) by mouth three times daily with meals, take 1 tablet (210 mg) with snacks    [provider]  hydrALAZINE (APRESOLINE) 25 MG tablet Take 3 tablets (75 mg total) by mouth every 8 (eight) hours. 02/05/22 06/13/23  Lorin Glass, MD  hydrOXYzine (VISTARIL) 25 MG capsule Take 25 mg by mouth 3 (three) times daily as needed for anxiety. 05/26/23   [provider]  lidocaine-prilocaine (EMLA) cream Apply 1 application  topically daily as needed (prior to port access). 07/09/20   [provider]  losartan (COZAAR) 100 MG tablet Take 1 tablet (100 mg  total) by mouth daily. 12/04/21 06/13/23  [place], Alvira Philips, DO  metoCLOPramide (REGLAN) 5 MG tablet Take 5 mg by mouth every 6 (six) hours as needed for nausea or vomiting.    [provider]  mycophenolate (MYFORTIC) 180 MG EC tablet Take 540 mg by mouth 2 (two) times daily.    [provider]  ondansetron (ZOFRAN) 4 MG tablet Take 4 mg by mouth every 8 (eight) hours as needed for nausea or vomiting.    [provider]  oxyCODONE-acetaminophen (PERCOCET/ROXICET) 5-325 MG tablet Take 1 tablet by mouth 3 (three) times daily.    [provider]  pantoprazole (PROTONIX) 40 MG tablet Take 1 tablet (40 mg total) by mouth daily. 02/06/22 05/07/22  Lorin Glass, MD  predniSONE (DELTASONE) 5 MG tablet Take 15 mg by mouth daily. 06/24/15   [provider]  sodium zirconium cyclosilicate (LOKELMA) 10 g PACK packet Take 10 g by mouth See admin instructions. Take one packet (10  g) by mouth (dissolved in water) daily on Non-Dialysis (Sun,Tues, Thurs, Sat)    [provider]  zolpidem (AMBIEN) 5 MG tablet Take 5 mg by mouth at bedtime.    [provider]     Vitals:   11/05/23 1500 11/05/23 1600 11/05/23 1613 11/05/23 1634  BP: (!) 159/74 (!) 185/83 (!) 181/62 (!) 195/75  Pulse: 90 86 86 84  Resp: (!) 21 13 15 13   Temp:   (!) 97.1 F (36.2 C)   TempSrc:      SpO2: 98% 99% 100% 99%   Exam Gen alert, no distress No rash, cyanosis or gangrene Sclera anicteric, throat clear  No jvd or bruits Chest clear bilat to bases, no rales/ wheezing RRR no MRG Abd soft ntnd no mass or ascites +bs GU defer MS no joint effusions or deformity Ext trace LE or UE edema, no other edema Neuro is alert, Ox 3 , nf    AVF RUA +bruit       Renal-related home meds: - pending     OP HD: SW MWF 3h   B400   57.6kg  2K bath  AVF  Heparin none  - getting to dry wt, dry wt lowered 2-3 times in last 3 wks - post HD wt 2/26 was 59.5kg - missed HD 2/28 -  hectorol 3 mcg IV three times per week - sensipar 120mg  po three times per week    Assessment/ Plan: Severe hyperkalemia - K+ > 7.5, mild QRS widening. Sp temporizing measures and getting dialysis now upstairs. QRS stable and no bradycardia. F/u labs in am.   ESRD - on HD MWF. Missed HD last Friday. HD today in progress.  HTN - bp's high in ED, lower vol as tolerated w/ HD.  Volume - not grossly overloaded on exam. UF 3 L as tol.  Anemia of esrd - Hb 13, no esa needs Secondary hyperparathyroidism - CCa in range. Add on phos, cont binders w/ meals.  T1DM - w/ all the complications      Vinson Moselle  MD [facility] 11/05/2023, 4:58 PM  Recent Labs  Lab 11/05/23 0343 11/05/23 1006  HGB 13.0  --   ALBUMIN 4.2  --   CALCIUM 9.8  --   CREATININE 11.51*  --   K >7.5* >7.5*   Inpatient medications:  Chlorhexidine Gluconate Cloth  6 each Topical Q0600   heparin  5,000 Units Subcutaneous Q8H   sodium zirconium cyclosilicate  10 g Oral Once    anticoagulant sodium citrate     alteplase, anticoagulant sodium citrate, heparin, lidocaine (PF), lidocaine-prilocaine, pentafluoroprop-tetrafluoroeth

## 2023-11-05 NOTE — Progress Notes (Signed)
 Examined patient at bedside after returning from dialysis at [facility]. RN concerned as patient was uncomfortable, not speaking in formed sentences, and has pinpoint pupils.   Reviewed earlier notes prior to coming to bedside. Earlier, patient was encephalopathic and not following directions. Somnolent. This patient came in with a K of 7.5 and EKG changes thought to be in the setting of missed HD sessions. No follow up labs yet since dialysis.  At bedside, Initially attempted communication with Arabic interpreter; patient was responding to questions appropriately in both [place] and Arabic. She endorsed abdominal discomfort and nausea with YES/NO questions.   2x episodes of green/bilious vomiting   Patient was in mild distress, moving in the bed. Pinpoint pupils, tracking. Tachycardic but protecting her airway and with appropriate saturation on RA. Abdominal discomfort on mild palpation without rebound tenderness. Squeezing bilateral hands, following directions, and volitional movements of upper and lower extremity. Jerks/ myoclonus noted. Warm to the touch.  New labs: cBG 91 in room  With evolving presentation, suspect SBO despite no signs of this on earlier CT abdomen.  - Will get KUB - Continue NPO diet - Will reasses for needs of NG tube  AGMA Hyperkalemia Sp dialysis. - Repeat RFP  - Follow lactic acid  Encephalopathy - Hold opiates and centrally acting medications  - Will hold on vBG now. Not a current candidate for BiPAP with ongoing emesis   RN at beside. Will monitor closely  Morene Crocker, MD

## 2023-11-05 NOTE — ED Notes (Signed)
 Attending ED PA Arabella Merles, PA-C made aware of patient's tachycardia.

## 2023-11-05 NOTE — ED Notes (Signed)
 Pt transported to dialysis

## 2023-11-05 NOTE — Care Management Obs Status (Signed)
 MEDICARE OBSERVATION STATUS NOTIFICATION   Patient Details  Name: [name] [name] MRN: [id number] Date of Birth: 11-01-1980   Medicare Observation Status Notification Given:  Waylan Boga, RN 11/05/2023, 10:51 PM

## 2023-11-05 NOTE — Procedures (Signed)
 I was present at the procedure, reviewed the HD regimen and made appropriate changes.   Vinson Moselle MD  [facility] 11/05/2023, 5:13 PM

## 2023-11-06 DIAGNOSIS — N186 End stage renal disease: Secondary | ICD-10-CM | POA: Diagnosis present

## 2023-11-06 DIAGNOSIS — Z992 Dependence on renal dialysis: Secondary | ICD-10-CM | POA: Diagnosis not present

## 2023-11-06 DIAGNOSIS — T8612 Kidney transplant failure: Secondary | ICD-10-CM | POA: Diagnosis present

## 2023-11-06 DIAGNOSIS — R109 Unspecified abdominal pain: Secondary | ICD-10-CM | POA: Diagnosis present

## 2023-11-06 DIAGNOSIS — Z91158 Patient's noncompliance with renal dialysis for other reason: Secondary | ICD-10-CM | POA: Diagnosis not present

## 2023-11-06 DIAGNOSIS — G9341 Metabolic encephalopathy: Secondary | ICD-10-CM | POA: Diagnosis present

## 2023-11-06 DIAGNOSIS — G9340 Encephalopathy, unspecified: Secondary | ICD-10-CM | POA: Diagnosis not present

## 2023-11-06 DIAGNOSIS — Z8673 Personal history of transient ischemic attack (TIA), and cerebral infarction without residual deficits: Secondary | ICD-10-CM | POA: Diagnosis not present

## 2023-11-06 DIAGNOSIS — F419 Anxiety disorder, unspecified: Secondary | ICD-10-CM | POA: Diagnosis present

## 2023-11-06 DIAGNOSIS — N2581 Secondary hyperparathyroidism of renal origin: Secondary | ICD-10-CM | POA: Diagnosis present

## 2023-11-06 DIAGNOSIS — D696 Thrombocytopenia, unspecified: Secondary | ICD-10-CM | POA: Diagnosis present

## 2023-11-06 DIAGNOSIS — G4489 Other headache syndrome: Secondary | ICD-10-CM | POA: Diagnosis present

## 2023-11-06 DIAGNOSIS — K3184 Gastroparesis: Secondary | ICD-10-CM | POA: Diagnosis present

## 2023-11-06 DIAGNOSIS — D631 Anemia in chronic kidney disease: Secondary | ICD-10-CM | POA: Diagnosis present

## 2023-11-06 DIAGNOSIS — E872 Acidosis, unspecified: Secondary | ICD-10-CM | POA: Diagnosis present

## 2023-11-06 DIAGNOSIS — Z9483 Pancreas transplant status: Secondary | ICD-10-CM | POA: Diagnosis not present

## 2023-11-06 DIAGNOSIS — I120 Hypertensive chronic kidney disease with stage 5 chronic kidney disease or end stage renal disease: Secondary | ICD-10-CM | POA: Diagnosis present

## 2023-11-06 DIAGNOSIS — R9431 Abnormal electrocardiogram [ECG] [EKG]: Secondary | ICD-10-CM | POA: Diagnosis present

## 2023-11-06 DIAGNOSIS — G8929 Other chronic pain: Secondary | ICD-10-CM | POA: Diagnosis present

## 2023-11-06 DIAGNOSIS — Z794 Long term (current) use of insulin: Secondary | ICD-10-CM | POA: Diagnosis not present

## 2023-11-06 DIAGNOSIS — R1011 Right upper quadrant pain: Secondary | ICD-10-CM | POA: Diagnosis present

## 2023-11-06 DIAGNOSIS — G253 Myoclonus: Secondary | ICD-10-CM | POA: Diagnosis present

## 2023-11-06 DIAGNOSIS — E1043 Type 1 diabetes mellitus with diabetic autonomic (poly)neuropathy: Secondary | ICD-10-CM | POA: Diagnosis present

## 2023-11-06 DIAGNOSIS — E875 Hyperkalemia: Secondary | ICD-10-CM | POA: Diagnosis present

## 2023-11-06 DIAGNOSIS — Y830 Surgical operation with transplant of whole organ as the cause of abnormal reaction of the patient, or of later complication, without mention of misadventure at the time of the procedure: Secondary | ICD-10-CM | POA: Diagnosis present

## 2023-11-06 DIAGNOSIS — I160 Hypertensive urgency: Secondary | ICD-10-CM | POA: Diagnosis present

## 2023-11-06 DIAGNOSIS — E1022 Type 1 diabetes mellitus with diabetic chronic kidney disease: Secondary | ICD-10-CM | POA: Diagnosis present

## 2023-11-06 LAB — CBC
HCT: 32.7 % — ABNORMAL LOW (ref 36.0–46.0)
Hemoglobin: 10.9 g/dL — ABNORMAL LOW (ref 12.0–15.0)
MCH: 30.4 pg (ref 26.0–34.0)
MCHC: 33.3 g/dL (ref 30.0–36.0)
MCV: 91.3 fL (ref 80.0–100.0)
Platelets: 108 10*3/uL — ABNORMAL LOW (ref 150–400)
RBC: 3.58 MIL/uL — ABNORMAL LOW (ref 3.87–5.11)
RDW: 12.6 % (ref 11.5–15.5)
WBC: 3.8 10*3/uL — ABNORMAL LOW (ref 4.0–10.5)
nRBC: 0 % (ref 0.0–0.2)

## 2023-11-06 LAB — RESP PANEL BY RT-PCR (RSV, FLU A&B, COVID)  RVPGX2
Influenza A by PCR: NEGATIVE
Influenza B by PCR: NEGATIVE
Resp Syncytial Virus by PCR: NEGATIVE
SARS Coronavirus 2 by RT PCR: NEGATIVE

## 2023-11-06 LAB — GLUCOSE, CAPILLARY
Glucose-Capillary: 76 mg/dL (ref 70–99)
Glucose-Capillary: 74 mg/dL (ref 70–99)
Glucose-Capillary: 64 mg/dL — ABNORMAL LOW (ref 70–99)
Glucose-Capillary: 99 mg/dL (ref 70–99)

## 2023-11-06 LAB — RENAL FUNCTION PANEL
Albumin: 3.3 g/dL — ABNORMAL LOW (ref 3.5–5.0)
Anion gap: 14 (ref 5–15)
BUN: 43 mg/dL — ABNORMAL HIGH (ref 6–20)
CO2: 27 mmol/L (ref 22–32)
Calcium: 8.7 mg/dL — ABNORMAL LOW (ref 8.9–10.3)
Chloride: 94 mmol/L — ABNORMAL LOW (ref 98–111)
Creatinine, Ser: 6.29 mg/dL — ABNORMAL HIGH (ref 0.44–1.00)
GFR, Estimated: 8 mL/min — ABNORMAL LOW (ref >60)
Glucose, Bld: 85 mg/dL (ref 70–99)
Phosphorus: 6.1 mg/dL — ABNORMAL HIGH (ref 2.5–4.6)
Potassium: 5.3 mmol/L — ABNORMAL HIGH (ref 3.5–5.1)
Sodium: 135 mmol/L (ref 135–145)

## 2023-11-06 LAB — HEPATITIS B SURFACE ANTIBODY, QUANTITATIVE: Hep B S AB Quant (Post): 147 m[IU]/mL

## 2023-11-06 LAB — CBG MONITORING, ED
Glucose-Capillary: 82 mg/dL (ref 70–99)
Glucose-Capillary: 82 mg/dL (ref 70–99)

## 2023-11-06 MED ORDER — HYDROXYZINE PAMOATE 25 MG PO CAPS: 25.0000 mg | ORAL_CAPSULE | Freq: Three times a day (TID) | ORAL | Status: DC | PRN

## 2023-11-06 MED ORDER — BISACODYL 10 MG RE SUPP
10.0000 mg | Freq: Every day | RECTAL | Status: DC | PRN
  Administered 2023-11-06: 10 mg via RECTAL
  Filled 2023-11-06: qty 1

## 2023-11-06 MED ORDER — ONDANSETRON HCL 4 MG/2ML IJ SOLN
4.0000 mg | Freq: Four times a day (QID) | INTRAMUSCULAR | Status: DC | PRN
  Administered 2023-11-06: 4 mg via INTRAVENOUS
  Filled 2023-11-06: qty 2

## 2023-11-06 MED ORDER — METOCLOPRAMIDE HCL 10 MG PO TABS: 5.0000 mg | ORAL_TABLET | Freq: Four times a day (QID) | ORAL | Status: DC | PRN

## 2023-11-06 MED ORDER — METOPROLOL TARTRATE 5 MG/5ML IV SOLN
10.0000 mg | Freq: Four times a day (QID) | INTRAVENOUS | Status: DC | PRN
Start: 2023-11-06 — End: 2023-11-08
  Administered 2023-11-06 – 2023-11-08 (×5): 10 mg via INTRAVENOUS
  Filled 2023-11-06 (×5): qty 10

## 2023-11-06 MED ORDER — HYDROMORPHONE HCL 1 MG/ML IJ SOLN
0.5000 mg | Freq: Once | INTRAMUSCULAR | Status: AC
  Administered 2023-11-06: 0.5 mg via INTRAVENOUS
  Filled 2023-11-06: qty 0.5

## 2023-11-06 MED ORDER — HYDROMORPHONE HCL 1 MG/ML IJ SOLN
0.5000 mg | Freq: Once | INTRAMUSCULAR | Status: AC
  Administered 2023-11-06: 0.5 mg via INTRAVENOUS
  Filled 2023-11-06: qty 1

## 2023-11-06 MED ORDER — DEXTROSE 50 % IV SOLN: 1.0000 | Freq: Once | INTRAVENOUS | Status: AC

## 2023-11-06 MED ORDER — FERRIC CITRATE 1 GM 210 MG(FE) PO TABS
630.0000 mg | ORAL_TABLET | Freq: Three times a day (TID) | ORAL | Status: DC
  Administered 2023-11-07: 630 mg via ORAL
  Filled 2023-11-06 (×7): qty 3

## 2023-11-06 MED ORDER — LOSARTAN POTASSIUM 50 MG PO TABS
100.0000 mg | ORAL_TABLET | Freq: Every day | ORAL | Status: DC
  Administered 2023-11-08: 100 mg via ORAL
  Filled 2023-11-06: qty 2

## 2023-11-06 MED ORDER — AMLODIPINE BESYLATE 5 MG PO TABS
10.0000 mg | ORAL_TABLET | Freq: Every day | ORAL | Status: DC
  Administered 2023-11-08: 10 mg via ORAL
  Filled 2023-11-06 (×2): qty 2

## 2023-11-06 MED ORDER — CHLORHEXIDINE GLUCONATE CLOTH 2 % EX PADS
6.0000 | MEDICATED_PAD | Freq: Every day | CUTANEOUS | Status: DC
  Administered 2023-11-07 – 2023-11-08 (×2): 6 via TOPICAL

## 2023-11-06 MED ORDER — BUSPIRONE HCL 10 MG PO TABS
10.0000 mg | ORAL_TABLET | Freq: Two times a day (BID) | ORAL | Status: DC
  Administered 2023-11-08: 10 mg via ORAL
  Filled 2023-11-06 (×3): qty 1

## 2023-11-06 MED ORDER — CYCLOSPORINE MODIFIED (NEORAL) 100 MG PO CAPS
200.0000 mg | ORAL_CAPSULE | Freq: Two times a day (BID) | ORAL | Status: DC
  Administered 2023-11-08: 200 mg via ORAL
  Filled 2023-11-06 (×5): qty 2

## 2023-11-06 MED ORDER — DEXTROSE 50 % IV SOLN
12.5000 g | INTRAVENOUS | Status: AC
  Administered 2023-11-06: 12.5 g via INTRAVENOUS
  Filled 2023-11-06: qty 50

## 2023-11-06 MED ORDER — HYDROXYZINE HCL 50 MG/ML IM SOLN
25.0000 mg | Freq: Once | INTRAMUSCULAR | Status: AC
  Administered 2023-11-06: 25 mg via INTRAMUSCULAR
  Filled 2023-11-06 (×2): qty 0.5

## 2023-11-06 MED ORDER — ONDANSETRON HCL 4 MG/2ML IJ SOLN
4.0000 mg | Freq: Once | INTRAMUSCULAR | Status: AC
  Administered 2023-11-06: 4 mg via INTRAVENOUS
  Filled 2023-11-06: qty 2

## 2023-11-06 MED ORDER — ACETAMINOPHEN 10 MG/ML IV SOLN
1000.0000 mg | Freq: Once | INTRAVENOUS | Status: AC
  Administered 2023-11-06: 1000 mg via INTRAVENOUS
  Filled 2023-11-06: qty 100

## 2023-11-06 MED ORDER — PROMETHAZINE HCL 25 MG RE SUPP
25.0000 mg | Freq: Four times a day (QID) | RECTAL | Status: DC | PRN
Start: 2023-11-06 — End: 2023-11-08
  Administered 2023-11-06 – 2023-11-07 (×2): 25 mg via RECTAL
  Filled 2023-11-06 (×5): qty 1

## 2023-11-06 MED ORDER — SODIUM ZIRCONIUM CYCLOSILICATE 5 G PO PACK
5.0000 g | PACK | Freq: Two times a day (BID) | ORAL | Status: DC
  Administered 2023-11-06: 5 g via ORAL
  Filled 2023-11-06 (×2): qty 1

## 2023-11-06 MED ORDER — FERRIC CITRATE 1 GM 210 MG(FE) PO TABS: 630.0000 mg | ORAL_TABLET | Freq: Two times a day (BID) | ORAL | Status: DC | PRN

## 2023-11-06 MED ORDER — HYDROMORPHONE HCL 1 MG/ML IJ SOLN
1.0000 mg | Freq: Once | INTRAMUSCULAR | Status: AC
  Administered 2023-11-06: 1 mg via INTRAVENOUS
  Filled 2023-11-06: qty 1

## 2023-11-06 MED ORDER — ASPIRIN 81 MG PO TBEC
81.0000 mg | DELAYED_RELEASE_TABLET | Freq: Every day | ORAL | Status: DC
  Administered 2023-11-08: 81 mg via ORAL
  Filled 2023-11-06 (×2): qty 1

## 2023-11-06 MED ORDER — METOCLOPRAMIDE HCL 10 MG PO TABS: 5.0000 mg | ORAL_TABLET | Freq: Three times a day (TID) | ORAL | Status: DC

## 2023-11-06 MED ORDER — PREDNISONE 5 MG PO TABS
15.0000 mg | ORAL_TABLET | Freq: Every day | ORAL | Status: DC
  Filled 2023-11-06: qty 3

## 2023-11-06 MED ORDER — ONDANSETRON HCL 4 MG PO TABS: 4.0000 mg | ORAL_TABLET | Freq: Four times a day (QID) | ORAL | Status: DC | PRN

## 2023-11-06 MED ORDER — CLONIDINE HCL 0.1 MG/24HR TD PTWK
0.1000 mg | MEDICATED_PATCH | TRANSDERMAL | Status: DC
  Administered 2023-11-06: 0.1 mg via TRANSDERMAL
  Filled 2023-11-06: qty 1

## 2023-11-06 MED ORDER — GABAPENTIN 100 MG PO CAPS
100.0000 mg | ORAL_CAPSULE | Freq: Two times a day (BID) | ORAL | Status: DC
  Administered 2023-11-08: 100 mg via ORAL
  Filled 2023-11-06 (×3): qty 1

## 2023-11-06 MED ORDER — DEXTROSE 50 % IV SOLN
INTRAVENOUS | Status: AC
  Administered 2023-11-06: 50 mL via INTRAVENOUS
  Filled 2023-11-06: qty 50

## 2023-11-06 NOTE — Progress Notes (Signed)
 15:39 - Patient's BP 179/71, unable to give PO meds due to n/v, messaged internal meds for PRN.   16:51 - CBG 74, patient is unable to tolerate PO d/t n/v, messaged internal med. D50 ordered and given.   17:34; Patient c/o anxiety, and continues to vomit.BP 196/92. Messaged internal med and received orders for IM hydroxyzine. No new orders for nausea or BP  19:20: SBP > 200, and patient continually vomiting, messaged internal med to request PRN.

## 2023-11-06 NOTE — Progress Notes (Addendum)
 Pt receives out-pt HD at [facility] [place] on MWF. Left  message for clinic CSW to inquire about pt's transportation situation to/from HD and if pt has back up plan for transportation if pt cannot drive self. Will await a return call. Will assist as needed.   Olivia Canter Renal Navigator [phone]

## 2023-11-06 NOTE — ED Notes (Signed)
 Pt is a dialysis pt and doesn't produces much urine. Will obtain urine specimen when she does uses the restroom

## 2023-11-06 NOTE — Progress Notes (Signed)
 The patient is still complaining severe abdominal pain,. No pain med in the [facility] to give. Also having N/V and can't get Zofran because it's not due to give. Her SBP have been consistently greater than 200 and she has not got any BP med this shift to lower it down.  Talked to Dr. Lily Kocher about that and has not received anything yet for the pain/ high BP but received  Phenergan. As seen in the [facility]. Patient very furious about the way her care Is running.

## 2023-11-06 NOTE — Evaluation (Signed)
 Physical Therapy Evaluation Patient Details Name: [name] [name] MRN: [id number] DOB: 1981-07-01 Today's Date: 11/06/2023  History of Present Illness  43 year old presented to the ED 3/3 with abdominal pain, chest pain, and headache and admitted for acute encephalopathy. N/V - possible gastroparesis; hyperkalemic. 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 Pt a little unsteady with transfers and gait, requiring CGA. Distance limited today to 22 feet, sporadic dyspnea that improves with distraction. States she is very anxious. Reports pain resolved after pain medication. Typically independent at home, takes care of a puppy, drives herself to HD. Has a friend that can check on her (currently sitting for her puppy.) Anticipate function to improve as her symptoms resolve and hopefully not need any PT follow-up. Will continue to follow and progress acutely. Pt currently with functional limitations due to the deficits listed below (see PT Problem List). Pt will benefit from acute skilled PT to increase their independence and safety with mobility to allow discharge.           If plan is discharge home, recommend the following: A little help with walking and/or transfers;A little help with bathing/dressing/bathroom;Assistance with cooking/housework;Assist for transportation   Can travel by private vehicle        Equipment Recommendations None recommended by PT  Recommendations for Other Services       Functional Status Assessment Patient has had a recent decline in their functional status and demonstrates the ability to make significant improvements in function in a reasonable and predictable amount of time.     Precautions / Restrictions Precautions Precautions: None Restrictions Weight Bearing Restrictions Per Provider  Order: No      Mobility  Bed Mobility               General bed mobility comments: sitting EOB    Transfers Overall transfer level: Needs assistance Equipment used: Rolling walker (2 wheels), None Transfers: Sit to/from Stand Sit to Stand: Contact guard assist           General transfer comment: Difficulty rising from bed without UE support. Leaning posteriorly and bracing back of legs against bed for support. Improved with RW to stabilize with cues. CGA for safety.    Ambulation/Gait Ambulation/Gait assistance: Contact guard assist Gait Distance (Feet): 22 Feet Assistive device: Rolling walker (2 wheels) Gait Pattern/deviations: Step-through pattern, Decreased stride length, Staggering right, Staggering left, Drifts right/left, Leaning posteriorly Gait velocity: dec Gait velocity interpretation: <1.31 ft/sec, indicative of household ambulator   General Gait Details: Intermittent instability, requires RW at all times to stabilize, cues for appropriate use and proximity to maximize support. CGA for safety. Pt feels anxious.  Stairs            Wheelchair Mobility     Tilt Bed    Modified Rankin (Stroke Patients Only)       Balance Overall balance assessment: Needs assistance Sitting-balance support: No upper extremity supported, Feet supported Sitting balance-Leahy Scale: Good     Standing balance support: Reliant on assistive device for balance, Bilateral upper extremity supported Standing balance-Leahy Scale: Poor                               Pertinent Vitals/Pain Pain Assessment Pain Assessment: No/denies pain    Home Living Family/patient expects to be  discharged to:: Private residence Living Arrangements: Alone Available Help at Discharge: Available PRN/intermittently;Friend(s) Type of Home: Apartment Home Access: Level entry       Home Layout: One level Home Equipment: Agricultural consultant (2 wheels)      Prior Function  Prior Level of Function : Independent/Modified Independent             Mobility Comments: walks dog, no falls, no AD use ADLs Comments: Drives to dialysis MWF     Extremity/Trunk Assessment   Upper Extremity Assessment Upper Extremity Assessment: Defer to OT evaluation    Lower Extremity Assessment Lower Extremity Assessment: Generalized weakness       Communication   Communication Communication: No apparent difficulties    Cognition Arousal: Alert Behavior During Therapy: WFL for tasks assessed/performed   PT - Cognitive impairments: No apparent impairments                         Following commands: Intact       Cueing Cueing Techniques: Verbal cues     General Comments General comments (skin integrity, edema, etc.): SpO2 in high 90s on RA, with HR in 80s throughout session. Pt sporadically dyspneic but resolves as quickly as it starts.    Exercises     Assessment/Plan    PT Assessment Patient needs continued PT services  PT Problem List Decreased strength;Decreased activity tolerance;Decreased balance;Decreased mobility;Decreased coordination;Decreased knowledge of use of DME;Cardiopulmonary status limiting activity       PT Treatment Interventions DME instruction;Gait training;Functional mobility training;Therapeutic activities;Therapeutic exercise;Balance training;Neuromuscular re-education;Patient/family education    PT Goals (Current goals can be found in the Care Plan section)  Acute Rehab PT Goals Patient Stated Goal: Get well, keep pain away PT Goal Formulation: With patient Time For Goal Achievement: 11/20/23 Potential to Achieve Goals: Good    Frequency Min 1X/week     Co-evaluation               AM-PAC PT "6 Clicks" Mobility  Outcome Measure Help needed turning from your back to your side while in a flat bed without using bedrails?: None Help needed moving from lying on your back to sitting on the side of a flat bed  without using bedrails?: None Help needed moving to and from a bed to a chair (including a wheelchair)?: A Little Help needed standing up from a chair using your arms (e.g., wheelchair or bedside chair)?: A Little Help needed to walk in hospital room?: A Little Help needed climbing 3-5 steps with a railing? : A Lot 6 Click Score: 19    End of Session Equipment Utilized During Treatment: Gait belt Activity Tolerance: Other (comment);Patient limited by fatigue (anxious) Patient left: in bed;with call bell/phone within reach;with bed alarm set Nurse Communication: Mobility status PT Visit Diagnosis: Unsteadiness on feet (R26.81);Other abnormalities of gait and mobility (R26.89);Muscle weakness (generalized) (M62.81);Other symptoms and signs involving the nervous system (R29.898)    Time: 1610-9604 PT Time Calculation (min) (ACUTE ONLY): 21 min   Charges:   PT Evaluation $PT Eval Low Complexity: 1 Low   PT General Charges $$ ACUTE PT VISIT: 1 Visit         Kathlyn [place], PT, DPT [facility]  Rehabilitation Services Physical Therapist Office: [phone] Website: [email or web address]   Berton Mount 11/06/2023, 2:21 PM

## 2023-11-06 NOTE — Progress Notes (Signed)
 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    Overnight Events: Noted to have had 2 episodes of green, bilious vomiting. KUB normal.  Endorsed abnormal discomfort and nausea. Pupils were pinpoint but tracking. Tachycardic but protecting airway. Continued to have jerks/myoclonus noted earlier in the evening.   Interim History: Patient was evaluated at bedside.  She was able to participate in assessment.  She discussed generalized abdominal pain that was worse in her right upper quadrant.  Missed her dialysis session because her car broke down. She became tearful as she felt overwhelmed with her hospitalization.  Also notes that her bilious vomiting occurred on Sunday in which she had 2-3 episodes.  OBJECTIVE:  Vital Signs: Vitals:   11/06/23 0331 11/06/23 0530 11/06/23 0635 11/06/23 0700  BP: (!) 168/79 (!) 187/83  (!) 181/90  Pulse: 88 87  82  Resp: 12 (!) 9  (!) 21  Temp: 100.2 F (37.9 C) 98.8 F (37.1 C) 98.7 F (37.1 C) 98.6 F (37 C)  TempSrc: Oral Oral Oral   SpO2: 91% 96%  95%   Supplemental O2: Room Air SpO2: 95 % O2 Flow Rate (L/min): 2 L/min  There were no vitals filed for this visit.   Intake/Output Summary (Last 24 hours) at 11/06/2023 0737 Last data filed at 11/06/2023 4782 Gross per 24 hour  Intake 200 ml  Output 3 ml  Net 197 ml   Net IO Since Admission: 197 mL [11/06/23 0737]  CBC    Component Value Date/Time   WBC 3.8 (L) 11/06/2023 0736   RBC 3.58 (L) 11/06/2023 0736   HGB 10.9 (L) 11/06/2023 0736   HCT 32.7 (L) 11/06/2023 0736   PLT 108 (L) 11/06/2023 0736   MCV 91.3 11/06/2023 0736   MCH 30.4 11/06/2023 0736   MCHC 33.3 11/06/2023 0736   RDW 12.6  11/06/2023 0736   LYMPHSABS 1.0 11/05/2023 0343   MONOABS 0.2 11/05/2023 0343   EOSABS 0.1 11/05/2023 0343   BASOSABS 0.0 11/05/2023 0343   CMP     Component Value Date/Time   NA 135 11/06/2023 0736   K 5.3 (H) 11/06/2023 0736   CL 94 (L) 11/06/2023 0736   CO2 27 11/06/2023 0736   GLUCOSE 85 11/06/2023 0736   BUN 43 (H) 11/06/2023 0736   CREATININE 6.29 (H) 11/06/2023 0736   CALCIUM 8.7 (L) 11/06/2023 0736   PROT 7.9 11/05/2023 0343   ALBUMIN 3.3 (L) 11/06/2023 0736   AST 11 (L) 11/05/2023 0343   ALT 10 11/05/2023 0343   ALKPHOS 91 11/05/2023 0343   BILITOT 1.2 11/05/2023 0343   GFRNONAA 8 (L) 11/06/2023 0736   Physical Exam: Physical Exam Constitutional:      Appearance: Normal appearance.  HENT:     Head: Normocephalic and atraumatic.  Cardiovascular:     Rate and Rhythm: Normal rate and regular rhythm.  Pulmonary:     Effort: Pulmonary effort is normal.     Breath sounds: Normal breath sounds.  Abdominal:     Tenderness: There is abdominal tenderness in the right upper quadrant.  Skin:    General: Skin is warm.     Capillary Refill: Capillary refill takes less than 2 seconds.  Neurological:  Mental Status: She is alert.  Psychiatric:        Mood and Affect: Affect is tearful.    CBC    Component Value Date/Time   WBC 3.8 (L) 11/06/2023 0736   RBC 3.58 (L) 11/06/2023 0736   HGB 10.9 (L) 11/06/2023 0736   HCT 32.7 (L) 11/06/2023 0736   PLT 108 (L) 11/06/2023 0736   MCV 91.3 11/06/2023 0736   MCH 30.4 11/06/2023 0736   MCHC 33.3 11/06/2023 0736   RDW 12.6 11/06/2023 0736   LYMPHSABS 1.0 11/05/2023 0343   MONOABS 0.2 11/05/2023 0343   EOSABS 0.1 11/05/2023 0343   BASOSABS 0.0 11/05/2023 0343   CMP     Component Value Date/Time   NA 135 11/06/2023 0736   K 5.3 (H) 11/06/2023 0736   CL 94 (L) 11/06/2023 0736   CO2 27 11/06/2023 0736   GLUCOSE 85 11/06/2023 0736   BUN 43 (H) 11/06/2023 0736   CREATININE 6.29 (H) 11/06/2023 0736   CALCIUM 8.7  (L) 11/06/2023 0736   PROT 7.9 11/05/2023 0343   ALBUMIN 3.3 (L) 11/06/2023 0736   AST 11 (L) 11/05/2023 0343   ALT 10 11/05/2023 0343   ALKPHOS 91 11/05/2023 0343   BILITOT 1.2 11/05/2023 0343   GFRNONAA 8 (L) 11/06/2023 0736   ASSESSMENT/PLAN:  Assessment: Principal Problem:   Acute encephalopathy  [name] [name] is a 43 year old with 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  Plan: #Acute Encephalopathy 2/2 Hyperuremia  #ESRD #Hyperkalemia  Followed by nephro. Tolerated HD well yesterday.  Mental status improved today as she was alert and oriented x 3.  States that she missed dialysis last week because her car broke down.  Potassium up to 5.3. BUN improved but elevated from 111 yesterday to 43 today.  Scheduled for HD tomorrow.  Possible discharge tomorrow after dialysis if she continues to improve. - Follow-up nephrology - Lokelma 5 g for 2 doses - HD tomorrow  #Abdominal pain Chronic.  Localized to right upper quadrant.  Worsened by eating meals in which she now eats several small meals per day to manage this.  Reached out to [facility] for [facility] records.  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  Notes bilious vomiting began on Sunday and this had not occurred before. - Continue to hold opioids  #Hypertension Blood pressure remains elevated in the 180s.  Will resume amlodipine 10 mg daily and clonidine patch. - Resume amlodipine - Resume clonidine patch  #Hx of Pancreatic/Renal Transplant, 2015  #Hx of Failed Renal Transplant, 2019 Glucose has been stable in the 80s-100s.   Chronic Conditions:  #Thrombocytopenia: Stable at 108.  No signs/symptoms of bleeding. #Normocytic Anemia: Hgb down from 13.0 to 10.9. Will continue to trend CBC.  #Anxiety: Holding home meds of Ambien 5 mg at nighttime, duloxetine 20 mg daily, hydroxyzine 25 mg TID PRN.  Resuming home BuSpar.  Best Practice: Diet: NPO IVF: Fluids: None, Rate: None VTE: heparin injection 5,000 Units Start: 11/05/23 1400 Code: Full AB: None Therapy Recs:  TBD , DME: other TBD Family Contact: friend Sheria Lang, to be notified. DISPO: Anticipated discharge  TBD  to Home pending  medication reconciliation and electrolyte abnormalities .  Signature: Morrie Sheldon, MD Internal Medicine Resident, PGY-1 [facility]  Pager: [phone]  Please contact the on call pager after 5 pm and on weekends at [phone].

## 2023-11-06 NOTE — Progress Notes (Signed)
 [facility] Progress Note  Subjective:  Seen in hospital room Feeling better but still N/V  Vitals:   11/06/23 0730 11/06/23 0800 11/06/23 0830 11/06/23 0923  BP: (!) 181/83 (!) 181/84 (!) 185/81 (!) 152/93  Pulse: 87 78 81 80  Resp: 17 16 13 17   Temp:    98.6 F (37 C)  TempSrc:    Oral  SpO2: 91% 95% 93% 94%  Height:    5\' 2"  (1.575 m)    Exam: Gen alert, no distress, on RA No jvd or bruits Chest clear bilat to bases RRR no MRG Abd soft ntnd no mass or ascites +bs Ext trace LE edema  Neuro is alert, Ox 3 , nf    AVF RUA +bruit       Renal-related home meds: - norvasc 10 - sensipar 90 hs - ferric citrate 3 ac tid - hydralazine 75 tid - losartan 100 every day - lokelma 10 gm every day - xphozah 30 bid - coreg 12.5 bid       OP HD: SW MWF 3h   B400   57.6kg  2K bath  AVF  Heparin none  - getting to dry wt, dry wt lowered 2-3 times in last 3 wks - post HD wt 2/26 was 59.5kg - missed HD 2/28 - hectorol 3 mcg IV three times per week - sensipar 120mg  po three times per week       Assessment/ Plan: N/V - possible gastroparesis, hx of T1DM  Severe hyperkalemia - K+ > 7.5, mild QRS widening. Sp temporizing measures and then HD yesterday afternoon. Today K+ 5.3. Plan HD tomorrow.  ESRD - on HD MWF. Missed Friday HD 2/28. HD here yesterday. HD tomorrow.  HTN - bp's high in ED, better today w/ home meds and sp HD Volume - 3 L off yest w/ high bp's throughout the session. At dry wt today. UF 1-2 L w/ HD tomorrow.  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  SP panc/ kidney transplant - pancreas still working but back on dialysis    [employer] MD  [facility] 11/06/2023, 10:14 AM  Recent Labs  Lab 11/05/23 0343 11/05/23 1006 11/05/23 1940 11/06/23 0736  HGB 13.0  --   --  10.9*  ALBUMIN 4.2  --  3.6 3.3*  CALCIUM 9.8  --  9.1 8.7*  PHOS  --   --  5.2*  5.0* 6.1*   CREATININE 11.51*  --  5.17* 6.29*  K >7.5*   < > 5.0 5.3*   < > = values in this interval not displayed.   No results for input(s): "IRON", "TIBC", "FERRITIN" in the last 168 hours. Inpatient medications:  Chlorhexidine Gluconate Cloth  6 each Topical Q0600   cinacalcet  120 mg Oral Q M,W,F-1800   [START ON 11/07/2023] doxercalciferol  3 mcg Intravenous Q M,W,F-HD   heparin  5,000 Units Subcutaneous Q8H   sodium zirconium cyclosilicate  10 g Oral Once    bisacodyl

## 2023-11-06 NOTE — Progress Notes (Signed)
 Hypoglycemic Event  CBG: 64  Treatment: D50 25 mL (12.5 gm)  Symptoms: Shaky  Follow-up CBG: Time:22:28 CBG Result:99  Possible Reasons for Event: Vomiting and Inadequate meal intake  Comments/MD notified:Dr. Leota Jacobsen [facility]

## 2023-11-07 DIAGNOSIS — G9340 Encephalopathy, unspecified: Secondary | ICD-10-CM

## 2023-11-07 DIAGNOSIS — K3184 Gastroparesis: Secondary | ICD-10-CM

## 2023-11-07 DIAGNOSIS — I120 Hypertensive chronic kidney disease with stage 5 chronic kidney disease or end stage renal disease: Secondary | ICD-10-CM

## 2023-11-07 DIAGNOSIS — Z940 Kidney transplant status: Secondary | ICD-10-CM

## 2023-11-07 DIAGNOSIS — Z992 Dependence on renal dialysis: Secondary | ICD-10-CM

## 2023-11-07 DIAGNOSIS — N186 End stage renal disease: Secondary | ICD-10-CM

## 2023-11-07 DIAGNOSIS — Z9483 Pancreas transplant status: Secondary | ICD-10-CM

## 2023-11-07 LAB — CBC
HCT: 32.5 % — ABNORMAL LOW (ref 36.0–46.0)
Hemoglobin: 11 g/dL — ABNORMAL LOW (ref 12.0–15.0)
MCH: 30.4 pg (ref 26.0–34.0)
MCHC: 33.8 g/dL (ref 30.0–36.0)
MCV: 89.8 fL (ref 80.0–100.0)
Platelets: 100 10*3/uL — ABNORMAL LOW (ref 150–400)
RBC: 3.62 MIL/uL — ABNORMAL LOW (ref 3.87–5.11)
RDW: 12.5 % (ref 11.5–15.5)
WBC: 3.8 10*3/uL — ABNORMAL LOW (ref 4.0–10.5)
nRBC: 0 % (ref 0.0–0.2)

## 2023-11-07 LAB — GLUCOSE, CAPILLARY
Glucose-Capillary: 116 mg/dL — ABNORMAL HIGH (ref 70–99)
Glucose-Capillary: 92 mg/dL (ref 70–99)
Glucose-Capillary: 98 mg/dL (ref 70–99)
Glucose-Capillary: 127 mg/dL — ABNORMAL HIGH (ref 70–99)

## 2023-11-07 LAB — RENAL FUNCTION PANEL
Albumin: 3.2 g/dL — ABNORMAL LOW (ref 3.5–5.0)
Anion gap: 16 — ABNORMAL HIGH (ref 5–15)
BUN: 58 mg/dL — ABNORMAL HIGH (ref 6–20)
CO2: 25 mmol/L (ref 22–32)
Calcium: 8.8 mg/dL — ABNORMAL LOW (ref 8.9–10.3)
Chloride: 93 mmol/L — ABNORMAL LOW (ref 98–111)
Creatinine, Ser: 8.21 mg/dL — ABNORMAL HIGH (ref 0.44–1.00)
GFR, Estimated: 6 mL/min — ABNORMAL LOW (ref >60)
Glucose, Bld: 107 mg/dL — ABNORMAL HIGH (ref 70–99)
Phosphorus: 7.5 mg/dL — ABNORMAL HIGH (ref 2.5–4.6)
Potassium: 5.1 mmol/L (ref 3.5–5.1)
Sodium: 134 mmol/L — ABNORMAL LOW (ref 135–145)

## 2023-11-07 MED ORDER — HYDROMORPHONE HCL 1 MG/ML IJ SOLN
0.5000 mg | INTRAMUSCULAR | Status: DC | PRN
  Administered 2023-11-07: 0.5 mg via INTRAVENOUS
  Filled 2023-11-07: qty 0.5

## 2023-11-07 MED ORDER — HYDROMORPHONE HCL 1 MG/ML IJ SOLN
0.5000 mg | Freq: Once | INTRAMUSCULAR | Status: AC
  Administered 2023-11-07: 0.5 mg via INTRAVENOUS
  Filled 2023-11-07: qty 0.5

## 2023-11-07 MED ORDER — ALTEPLASE 2 MG IJ SOLR: 2.0000 mg | Freq: Once | INTRAMUSCULAR | Status: DC | PRN

## 2023-11-07 MED ORDER — PROMETHAZINE HCL 25 MG RE SUPP
25.0000 mg | Freq: Once | RECTAL | Status: DC
  Filled 2023-11-07: qty 1

## 2023-11-07 MED ORDER — HYDRALAZINE HCL 50 MG PO TABS: 50.0000 mg | ORAL_TABLET | Freq: Three times a day (TID) | ORAL | Status: DC

## 2023-11-07 MED ORDER — HYDRALAZINE HCL 50 MG PO TABS
50.0000 mg | ORAL_TABLET | Freq: Once | ORAL | Status: AC
  Administered 2023-11-07: 50 mg via ORAL
  Filled 2023-11-07: qty 1

## 2023-11-07 MED ORDER — NEPRO/CARBSTEADY PO LIQD: 237.0000 mL | ORAL | Status: DC | PRN

## 2023-11-07 MED ORDER — METOPROLOL TARTRATE 5 MG/5ML IV SOLN
10.0000 mg | Freq: Once | INTRAVENOUS | Status: AC
  Administered 2023-11-07: 10 mg via INTRAVENOUS
  Filled 2023-11-07: qty 10

## 2023-11-07 MED ORDER — LIDOCAINE HCL (PF) 1 % IJ SOLN: 5.0000 mL | INTRAMUSCULAR | Status: DC | PRN

## 2023-11-07 MED ORDER — LIDOCAINE-PRILOCAINE 2.5-2.5 % EX CREA: 1.0000 | TOPICAL_CREAM | CUTANEOUS | Status: DC | PRN

## 2023-11-07 MED ORDER — ANTICOAGULANT SODIUM CITRATE 4% (200MG/5ML) IV SOLN: 5.0000 mL | Status: DC | PRN

## 2023-11-07 MED ORDER — SCOPOLAMINE 1 MG/3DAYS TD PT72
1.0000 | MEDICATED_PATCH | TRANSDERMAL | Status: DC
  Administered 2023-11-07: 1.5 mg via TRANSDERMAL
  Filled 2023-11-07: qty 1

## 2023-11-07 MED ORDER — HEPARIN SODIUM (PORCINE) 1000 UNIT/ML DIALYSIS: 1000.0000 [IU] | INTRAMUSCULAR | Status: DC | PRN

## 2023-11-07 MED ORDER — OXYCODONE-ACETAMINOPHEN 5-325 MG PO TABS
1.0000 | ORAL_TABLET | Freq: Three times a day (TID) | ORAL | Status: DC | PRN
  Administered 2023-11-07 – 2023-11-08 (×3): 1 via ORAL
  Filled 2023-11-07 (×4): qty 1

## 2023-11-07 MED ORDER — PENTAFLUOROPROP-TETRAFLUOROETH EX AERO: 1.0000 | INHALATION_SPRAY | CUTANEOUS | Status: DC | PRN

## 2023-11-07 NOTE — Progress Notes (Signed)
 OT Cancellation Note  Patient Details Name: [name] [name] MRN: [id number] DOB: February 05, 1981   Cancelled Treatment:    Reason Eval/Treat Not Completed: Patient at procedure or test/ unavailable (HD - OT to f/u when pt is available)  Donia Pounds 11/07/2023, 9:54 AM

## 2023-11-07 NOTE — Progress Notes (Signed)
 Dr. Lily Kocher notified to come to the bedside and explain to the patient the reason she can't get pain medicine.

## 2023-11-07 NOTE — Progress Notes (Signed)
 Pt returned from HD with elevated BP, nausea. Pt received PRN hydralazine in HD. PRN phenergan given on floor. MD notified about BP, an additional Hydralazine 10mg  was ordered and given. Pt states she is unable to take POs this time d/t nausea. Pt currently resting in bed, states tylenol given in HD subsided headache.

## 2023-11-07 NOTE — Progress Notes (Signed)
 [facility] Progress Note  Subjective:  Seen in HD Still abd pain  Bp's up  Vitals:   11/07/23 0622 11/07/23 0700 11/07/23 0856 11/07/23 0900  BP:    (!) 154/82  Pulse: 77 81    Resp: 17 20    Temp:   97.9 F (36.6 C)   TempSrc:   Oral   SpO2: 94% 95%    Weight:      Height:        Exam: Gen alert, no distress, on RA No jvd or bruits Chest clear bilat to bases RRR no MRG Abd soft ntnd no mass or ascites +bs Ext trace LE edema  Neuro is alert, Ox 3 , nf    AVF RUA +bruit       Renal-related home meds: - norvasc 10 - sensipar 90 hs - ferric citrate 3 ac tid - hydralazine 75 tid - losartan 100 every day - lokelma 10 gm every day - xphozah 30 bid - coreg 12.5 bid       OP HD: SW MWF 3h   B400   57.6kg  2K bath  AVF  Heparin none  - getting to dry wt, dry wt lowered 2-3 times in last 3 wks - post HD wt 2/26 was 59.5kg - missed HD 2/28 - hectorol 3 mcg IV three times per week - sensipar 120mg  po three times per week       Assessment/ Plan: N/V - possible gastroparesis, hx of T1DM  Severe hyperkalemia - on admission K+ > 7.5, mild QRS widening. Sp temporizing measures and then HD. Resolved. ESRD - on HD MWF. Missed Friday HD 2/28. HD here 3/03. HD today.  HTN - bp's high in ED. Better but still spiking prob due to pain. Not getting all po meds d/t pain/ n/v. Getting IV metop 10mg  qid prn.  Volume - 1-2 kg under today, no vol excess on exam. Will keep even.  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  SP panc/ kidney transplant - pancreas still working but back on dialysis    [name] MD  [name] [name] 11/07/2023, 9:28 AM  Recent Labs  Lab 11/06/23 0736 11/07/23 0624  HGB 10.9* 11.0*  ALBUMIN 3.3* 3.2*  CALCIUM 8.7* 8.8*  PHOS 6.1* 7.5*  CREATININE 6.29* 8.21*  K 5.3* 5.1   No results for input(s): "IRON", "TIBC", "FERRITIN" in the last 168 hours. Inpatient  medications:  amLODipine  10 mg Oral Daily   aspirin EC  81 mg Oral Daily   busPIRone  10 mg Oral BID   Chlorhexidine Gluconate Cloth  6 each Topical Q0600   Chlorhexidine Gluconate Cloth  6 each Topical Q0600   cinacalcet  120 mg Oral Q M,W,F-1800   cloNIDine  0.1 mg Transdermal Weekly   cycloSPORINE modified  200 mg Oral BID   doxercalciferol  3 mcg Intravenous Q M,W,F-HD   ferric citrate  630 mg Oral TID WC   gabapentin  100 mg Oral BID   heparin  5,000 Units Subcutaneous Q8H   losartan  100 mg Oral Daily   predniSONE  15 mg Oral Daily   sodium zirconium cyclosilicate  5 g Oral BID    anticoagulant sodium citrate     alteplase, anticoagulant sodium citrate, bisacodyl, feeding supplement (NEPRO CARB STEADY), ferric citrate, heparin, HYDROmorphone (DILAUDID) injection, lidocaine (PF), lidocaine-prilocaine, metoprolol tartrate, pentafluoroprop-tetrafluoroeth, promethazine

## 2023-11-07 NOTE — Progress Notes (Signed)
 Received patient in bed. Awake,alert and oriented x 4. Consent verified.  Access used: Left arm AVF that worked well.  Duration of treatment: 3.5 hours.  UF goal : Even as ordered.  Tolerated treatment: Yes.  Medicines given: Hydromorphone 1.0 mg IV.                             Hectorol 3 mcg.                             Percocet 1 tab                             Metoprolol 10 mg Hand off to the patient's nurse: Back into her room with stable medical condition via transporter.

## 2023-11-07 NOTE — Progress Notes (Signed)
 PT Cancellation Note  Patient Details Name: [name] [name] MRN: [id number] DOB: 1981/01/10   Cancelled Treatment:    Reason Eval/Treat Not Completed: Patient at procedure or test/unavailable; in HD, will follow up.   Elray Mcgregor 11/07/2023, 12:09 PM Sheran Lawless, PT Acute Rehabilitation Services [phone] 11/07/2023

## 2023-11-07 NOTE — Progress Notes (Addendum)
 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    Overnight Events: Continued to endorse abdominal pain with associated hypertension and vomiting. SBP noted to be greater than 200. Given rectal phenergan with some relief but Zofran held. She was cautiously given IV dilaudid. She was also given hydroxyzine for some anxiety relief.   Interim History: Patient was evaluated at bedside.  Patient tolerating dialysis well.  Notes improvement in her nausea and vomiting after the Phenergan.  Continues to endorse generalized abdominal pain.  She was unable to eat yesterday.  She says she has an appetite but is scared to eat from her previous nausea and vomiting.  OBJECTIVE:  Vital Signs: Vitals:   11/07/23 0930 11/07/23 1000 11/07/23 1013 11/07/23 1030  BP: (!) 207/81 (!) 198/61  (!) 190/75  Pulse:      Resp:      Temp:      TempSrc:      SpO2:      Weight:   57.3 kg   Height:       Supplemental O2: Room Air SpO2: 95 % O2 Flow Rate (L/min): 2 L/min  Filed Weights   11/06/23 1048 11/07/23 0419 11/07/23 1013  Weight: 57.2 kg 56.1 kg 57.3 kg     Intake/Output Summary (Last 24 hours) at 11/07/2023 1104 Last data filed at 11/06/2023 1900 Gross per 24 hour  Intake --  Output 200 ml  Net -200 ml   Net IO Since Admission: -203 mL [11/07/23 1104]  CBC    Component Value Date/Time   WBC 3.8 (L) 11/07/2023 0624   RBC 3.62 (L) 11/07/2023 0624   HGB 11.0 (L) 11/07/2023 0624   HCT 32.5 (L) 11/07/2023 0624   PLT 100 (L) 11/07/2023 0624   MCV 89.8 11/07/2023 0624   MCH 30.4 11/07/2023 0624   MCHC 33.8 11/07/2023 0624   RDW 12.5 11/07/2023 0624   LYMPHSABS 1.0 11/05/2023 0343   MONOABS 0.2  11/05/2023 0343   EOSABS 0.1 11/05/2023 0343   BASOSABS 0.0 11/05/2023 0343   CMP     Component Value Date/Time   NA 134 (L) 11/07/2023 0624   K 5.1 11/07/2023 0624   CL 93 (L) 11/07/2023 0624   CO2 25 11/07/2023 0624   GLUCOSE 107 (H) 11/07/2023 0624   BUN 58 (H) 11/07/2023 0624   CREATININE 8.21 (H) 11/07/2023 0624   CALCIUM 8.8 (L) 11/07/2023 0624   PROT 7.9 11/05/2023 0343   ALBUMIN 3.2 (L) 11/07/2023 0624   AST 11 (L) 11/05/2023 0343   ALT 10 11/05/2023 0343   ALKPHOS 91 11/05/2023 0343   BILITOT 1.2 11/05/2023 0343   GFRNONAA 6 (L) 11/07/2023 0624   Physical Exam: Physical Exam Constitutional:      Appearance: Normal appearance.  HENT:     Head: Normocephalic and atraumatic.  Cardiovascular:     Rate and Rhythm: Normal rate and regular rhythm.  Pulmonary:     Effort: Pulmonary effort is normal.     Breath sounds: Normal breath sounds.  Abdominal:     General: Abdomen is flat. Bowel sounds are normal.     Tenderness: There is generalized abdominal tenderness.  Skin:    General: Skin is warm.  Capillary Refill: Capillary refill takes less than 2 seconds.  Neurological:     General: No focal deficit present.     Mental Status: She is alert.  Psychiatric:        Mood and Affect: Mood is anxious.        Behavior: Behavior normal.    CBC    Component Value Date/Time   WBC 3.8 (L) 11/07/2023 0624   RBC 3.62 (L) 11/07/2023 0624   HGB 11.0 (L) 11/07/2023 0624   HCT 32.5 (L) 11/07/2023 0624   PLT 100 (L) 11/07/2023 0624   MCV 89.8 11/07/2023 0624   MCH 30.4 11/07/2023 0624   MCHC 33.8 11/07/2023 0624   RDW 12.5 11/07/2023 0624   LYMPHSABS 1.0 11/05/2023 0343   MONOABS 0.2 11/05/2023 0343   EOSABS 0.1 11/05/2023 0343   BASOSABS 0.0 11/05/2023 0343   CMP     Component Value Date/Time   NA 134 (L) 11/07/2023 0624   K 5.1 11/07/2023 0624   CL 93 (L) 11/07/2023 0624   CO2 25 11/07/2023 0624   GLUCOSE 107 (H) 11/07/2023 0624   BUN 58 (H) 11/07/2023  0624   CREATININE 8.21 (H) 11/07/2023 0624   CALCIUM 8.8 (L) 11/07/2023 0624   PROT 7.9 11/05/2023 0343   ALBUMIN 3.2 (L) 11/07/2023 0624   AST 11 (L) 11/05/2023 0343   ALT 10 11/05/2023 0343   ALKPHOS 91 11/05/2023 0343   BILITOT 1.2 11/05/2023 0343   GFRNONAA 6 (L) 11/07/2023 0624   ASSESSMENT/PLAN:  Assessment: Principal Problem:   Acute encephalopathy  Ms. 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  Plan: #Acute Encephalopathy 2/2 Hyperuremia, improving #ESRD #Hyperkalemia, improving Continues to be followed by nepho.  Patient was receiving HD upon assessment.  Mental status stable from yesterday.  Affect less tearful.  Potassium high normal at 5.1.  BUN increased from 43 to 58.   - Follow-up nephrology - Trend renal function panel  #Gastroparesis #Abdominal pain #Nausea/vomiting Improved.  Continues to endorse generalized abdominal pain.  Pain improved compared to yesterday.  Her nausea was much improved from the Phenergan.  Will continue to cautiously resume her home meds. Holding gastric emptying exam at this time given persistent nausea/vomiting.  - Resume home meds as tolerated - Phenergan suppository 25 mg every 6 hours as needed for nausea/vomiting  #Hypertension Remains elevated.  Yesterday, her home meds were attempted to be resumed, but this was complicated by patient's nausea and vomiting.  She was given as needed hydralazine and metoprolol overnight for blood pressure control.  Anticipate that her blood pressure will improve today after dialysis and resuming her home meds with continued improvements in her nausea and vomiting. - Resume home meds as tolerated - Lopressor 10 mg IV every 6 hours as needed for  systolic blood pressure greater than 180  #Hx of Pancreatic/Renal Transplant, 2015  #Hx of Failed Renal Transplant, 2019 Glucose stable.  Prednisone and cyclosporine resumed. - Continue prednisone and cyclosporine  Chronic Conditions:  #Thrombocytopenia: Stable  #Normocytic Anemia: Stable #Anxiety: Continue home BuSpar.  Best Practice: Diet: Renal IVF: Fluids: None, Rate: None VTE: heparin injection 5,000 Units Start: 11/05/23 1400 Code: Full AB: None Therapy Recs:  None , DME: other TBD Family Contact: friend Sheria Lang,  to be notified. DISPO: Anticipated discharge  TBD  to Home pending  medication reconciliation and electrolyte abnormalities .  Signature: Morrie Sheldon, MD Internal Medicine Resident, PGY-1 [facility]  Pager: [phone]  Please contact the on call pager after 5 pm and on weekends at [phone].

## 2023-11-07 NOTE — Plan of Care (Signed)
  Problem: Education: Goal: Knowledge of General Education information will improve Description: Including pain rating scale, medication(s)/side effects and non-pharmacologic comfort measures Outcome: Progressing   Problem: Clinical Measurements: Goal: Will remain free from infection Outcome: Progressing Goal: Respiratory complications will improve Outcome: Progressing Goal: Cardiovascular complication will be avoided Outcome: Progressing   Problem: Activity: Goal: Risk for activity intolerance will decrease Outcome: Progressing   Problem: Clinical Measurements: Goal: Ability to maintain clinical measurements within normal limits will improve Outcome: Not Progressing   Problem: Elimination: Goal: Will not experience complications related to urinary retention Outcome: Not Progressing

## 2023-11-07 NOTE — Progress Notes (Signed)
 Received patient in bed. Awake,alert and oriented x 4. Consent verified.  Access used: Left arm AVF that worked well.  Duration of treatment: 3.5 hours.  UF goal : Even as ordered.  Tolerated treatment: Yes.  Medicines given: Hydromorphone 1.0 mg IV.                             Hectorol 3 mcg.                             Percocet 1 tab  Hand off to the patient's nurse: Back into her room with stable medical condition via transporter.

## 2023-11-07 NOTE — Progress Notes (Signed)
 PT Cancellation Note  Patient Details Name: Trisa Cranor MRN: [id number] DOB: [birth year]   Cancelled Treatment:    Reason Eval/Treat Not Completed: Patient at procedure or test/unavailable (Pt off the floor at dialysis. Will follow up later if time allows.)   Gladys Damme 11/07/2023, 9:57 AM

## 2023-11-08 ENCOUNTER — Encounter (HOSPITAL_COMMUNITY): Payer: Self-pay | Admitting: Internal Medicine

## 2023-11-08 LAB — CBC
HCT: 35.6 % — ABNORMAL LOW (ref 36.0–46.0)
Hemoglobin: 12.1 g/dL (ref 12.0–15.0)
MCH: 29.8 pg (ref 26.0–34.0)
MCHC: 34 g/dL (ref 30.0–36.0)
MCV: 87.7 fL (ref 80.0–100.0)
Platelets: 106 10*3/uL — ABNORMAL LOW (ref 150–400)
RBC: 4.06 MIL/uL (ref 3.87–5.11)
RDW: 12.1 % (ref 11.5–15.5)
WBC: 3.3 10*3/uL — ABNORMAL LOW (ref 4.0–10.5)
nRBC: 0 % (ref 0.0–0.2)

## 2023-11-08 LAB — RENAL FUNCTION PANEL
Albumin: 3.5 g/dL (ref 3.5–5.0)
Anion gap: 11 (ref 5–15)
BUN: 34 mg/dL — ABNORMAL HIGH (ref 6–20)
CO2: 24 mmol/L (ref 22–32)
Calcium: 8.8 mg/dL — ABNORMAL LOW (ref 8.9–10.3)
Chloride: 96 mmol/L — ABNORMAL LOW (ref 98–111)
Creatinine, Ser: 5.75 mg/dL — ABNORMAL HIGH (ref 0.44–1.00)
GFR, Estimated: 9 mL/min — ABNORMAL LOW (ref >60)
Glucose, Bld: 133 mg/dL — ABNORMAL HIGH (ref 70–99)
Phosphorus: 5.6 mg/dL — ABNORMAL HIGH (ref 2.5–4.6)
Potassium: 4.8 mmol/L (ref 3.5–5.1)
Sodium: 131 mmol/L — ABNORMAL LOW (ref 135–145)

## 2023-11-08 LAB — GLUCOSE, CAPILLARY
Glucose-Capillary: 120 mg/dL — ABNORMAL HIGH (ref 70–99)
Glucose-Capillary: 152 mg/dL — ABNORMAL HIGH (ref 70–99)
Glucose-Capillary: 118 mg/dL — ABNORMAL HIGH (ref 70–99)

## 2023-11-08 MED ORDER — ACETAMINOPHEN 10 MG/ML IV SOLN
1000.0000 mg | Freq: Once | INTRAVENOUS | Status: AC
Start: 2023-11-08 — End: 2023-11-08
  Administered 2023-11-08: 1000 mg via INTRAVENOUS
  Filled 2023-11-08: qty 100

## 2023-11-08 MED ORDER — HYDRALAZINE HCL 20 MG/ML IJ SOLN: 20.0000 mg | INTRAMUSCULAR | Status: DC

## 2023-11-08 MED ORDER — METOCLOPRAMIDE HCL 5 MG/ML IJ SOLN
5.0000 mg | Freq: Three times a day (TID) | INTRAMUSCULAR | Status: DC | PRN
  Administered 2023-11-08: 5 mg via INTRAVENOUS
  Filled 2023-11-08: qty 2

## 2023-11-08 MED ORDER — PREDNISONE 5 MG PO TABS: 15.0000 mg | ORAL_TABLET | Freq: Every day | ORAL | Status: DC

## 2023-11-08 MED ORDER — METHYLPREDNISOLONE SODIUM SUCC 40 MG IJ SOLR: 15.0000 mg | Freq: Every day | INTRAMUSCULAR | Status: DC

## 2023-11-08 MED ORDER — DIPHENHYDRAMINE HCL 50 MG/ML IJ SOLN
25.0000 mg | Freq: Once | INTRAMUSCULAR | Status: AC
  Administered 2023-11-08: 25 mg via INTRAVENOUS
  Filled 2023-11-08: qty 1

## 2023-11-08 MED ORDER — CLONIDINE HCL 0.2 MG/24HR TD PTWK
0.2000 mg | MEDICATED_PATCH | TRANSDERMAL | Status: DC
  Filled 2023-11-08: qty 1

## 2023-11-08 MED ORDER — HYDROXYZINE HCL 25 MG PO TABS
25.0000 mg | ORAL_TABLET | Freq: Once | ORAL | Status: AC
  Administered 2023-11-08: 25 mg via ORAL
  Filled 2023-11-08: qty 1

## 2023-11-08 MED ORDER — METHYLPREDNISOLONE SODIUM SUCC 40 MG IJ SOLR
40.0000 mg | Freq: Once | INTRAMUSCULAR | Status: AC
  Administered 2023-11-08: 40 mg via INTRAVENOUS
  Filled 2023-11-08: qty 1

## 2023-11-08 NOTE — Evaluation (Signed)
 Occupational Therapy Evaluation Patient Details Name: [name] [name] MRN: [id number] DOB: [birth year] Today's Date: 11/08/2023   History of Present Illness   43 year old presented to the ED 3/3 with abdominal pain, chest pain, and headache and admitted for acute encephalopathy. N/V - possible gastroparesis; hyperkalemic. 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  She has family and friends that assist if she is having a bad day.  Currently patient presents at baseline and does not need any follow up OT at this time.       If plan is discharge home, recommend the following:   Assist for transportation;Assistance with cooking/housework     Functional Status Assessment   Patient has had a recent decline in their functional status and demonstrates the ability to make significant improvements in function in a reasonable and predictable amount of time.     Equipment Recommendations   None recommended by OT     Recommendations for Other Services         Precautions/Restrictions         Mobility Bed Mobility                    Transfers Overall transfer level: Independent     Sit to Stand: Independent                  Balance                                           ADL either performed or assessed with clinical judgement   ADL Overall ADL's : At baseline                                             Vision Baseline Vision/History: 0 No visual deficits Vision Assessment?: No apparent visual deficits     Perception         Praxis         Pertinent Vitals/Pain       Extremity/Trunk Assessment Upper Extremity Assessment Upper Extremity Assessment: Overall WFL for tasks assessed   Lower  Extremity Assessment Lower Extremity Assessment: Defer to PT evaluation   Cervical / Trunk Assessment Cervical / Trunk Assessment: Normal   Communication Communication Communication: No apparent difficulties   Cognition                                             Cueing  General Comments      no family present   Exercises     Shoulder Instructions      Home Living Family/patient expects to be discharged to:: Private residence Living Arrangements: Alone Available Help at Discharge: Available PRN/intermittently;Friend(s) Type of Home: Apartment Home Access: Level entry     Home Layout: One level     Bathroom Shower/Tub: Chief Strategy Officer: Standard Bathroom Accessibility: Yes How Accessible: Accessible via walker Home Equipment: Agricultural consultant (2 wheels)  Prior Functioning/Environment Prior Level of Function : Independent/Modified Independent             Mobility Comments: walks dog, no falls, no AD use ADLs Comments: Drives to dialysis MWF    OT Problem List: Decreased activity tolerance;Pain   OT Treatment/Interventions:        OT Goals(Current goals can be found in the care plan section)       OT Frequency:       Co-evaluation              AM-PAC OT "6 Clicks" Daily Activity     Outcome Measure Help from another person eating meals?: None Help from another person taking care of personal grooming?: None Help from another person toileting, which includes using toliet, bedpan, or urinal?: None Help from another person bathing (including washing, rinsing, drying)?: None Help from another person to put on and taking off regular upper body clothing?: None Help from another person to put on and taking off regular lower body clothing?: None 6 Click Score: 24   End of Session Nurse Communication: Mobility status  Activity Tolerance: Patient tolerated treatment well Patient left: in chair (At  EOB)  OT Visit Diagnosis: Muscle weakness (generalized) (M62.81)                Time: 1610-9604 OT Time Calculation (min): 16 min Charges:  OT General Charges $OT Visit: 1 Visit OT Evaluation $OT Eval Low Complexity: 1 Low  Hal Neer OTR/L   Malachi Bonds 11/08/2023, 12:48 PM

## 2023-11-08 NOTE — Progress Notes (Signed)
 Patient provided discharge instructions with instruction to follow up with PCP and go for HD tomorrow as scheduled.

## 2023-11-08 NOTE — Progress Notes (Signed)
 Patient continues to experience persistent nausea and abdominal discomfort suspected in the setting of longstanding gastroparesis.    Treatments tried thus far: IV Zofran, rectal phenergan with limited relief. Patient unable to take PO medications or meals. Scopolamine patch added today; however, effect is delayed. Patient with prior history of akathisia with metoclopramide.  Discussed with PM floor pharmacist who went through currently medications with me. Of note, patient has not taken any of her scheduled prednisone doses (for hx of  pancreatic transplant). This could be contributing to her nausea.  Plan: -Will trial a dose of IV benadryl for nausea and monitor her mental status -IV tylenol for now, avoiding IV opiates -Will give a loading dose of solumedrol  Will link ongoing 15 mg prednisone PO, or Solumedrol 15mg  when oral is not possible because of nausea -Repeat EKG to monitor QTc -Discussed with PM beside RN

## 2023-11-08 NOTE — Progress Notes (Signed)
 Physical Therapy Treatment Patient Details Name: [name] [name] MRN: [id number] DOB: Dec 04, 1980 Today's Date: 11/08/2023   History of Present Illness 43 year old presented to the ED 3/3 with abdominal pain, chest pain, and headache and admitted for acute encephalopathy. N/V - possible gastroparesis; hyperkalemic. 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 Pt was able to ambulate in hallway with no AD at supervision level. Pt slightly unsteady however no overt LOB ntoed. Pt is eager to go home. No change in DC/DME recs at this time. PT will continue to follow.     If plan is discharge home, recommend the following: A little help with walking and/or transfers;A little help with bathing/dressing/bathroom;Assistance with cooking/housework;Assist for transportation   Can travel by private vehicle        Equipment Recommendations  None recommended by PT    Recommendations for Other Services       Precautions / Restrictions Precautions Precautions: None Restrictions Weight Bearing Restrictions Per Provider Order: No     Mobility  Bed Mobility Overal bed mobility: Independent                  Transfers Overall transfer level: Needs assistance Equipment used: None Transfers: Sit to/from Stand Sit to Stand: Supervision                Ambulation/Gait Ambulation/Gait assistance: Supervision Gait Distance (Feet): 250 Feet Assistive device: None Gait Pattern/deviations: Drifts right/left, Decreased stride length, Step-through pattern Gait velocity: dec     General Gait Details: Pt eager to go home and declined use of RW. Pt occasionally noted to drift L/R however overall steady. no LOB noted.   Stairs             Wheelchair Mobility     Tilt Bed    Modified Rankin (Stroke Patients Only)        Balance Overall balance assessment: Needs assistance Sitting-balance support: No upper extremity supported, Feet supported Sitting balance-Leahy Scale: Good     Standing balance support: No upper extremity supported, During functional activity Standing balance-Leahy Scale: Fair                              Hotel manager: No apparent difficulties  Cognition Arousal: Alert Behavior During Therapy: WFL for tasks assessed/performed   PT - Cognitive impairments: No apparent impairments                         Following commands: Intact      Cueing Cueing Techniques: Verbal cues  Exercises      General Comments General comments (skin integrity, edema, etc.): VSS      Pertinent Vitals/Pain Pain Assessment Pain Assessment: No/denies pain    Home Living                          Prior Function            PT Goals (current goals can now be found in the care plan section) Progress towards PT goals: Progressing toward goals    Frequency    Min 1X/week      PT Plan      Co-evaluation  AM-PAC PT "6 Clicks" Mobility   Outcome Measure  Help needed turning from your back to your side while in a flat bed without using bedrails?: None Help needed moving from lying on your back to sitting on the side of a flat bed without using bedrails?: None Help needed moving to and from a bed to a chair (including a wheelchair)?: A Little Help needed standing up from a chair using your arms (e.g., wheelchair or bedside chair)?: A Little Help needed to walk in hospital room?: A Little Help needed climbing 3-5 steps with a railing? : A Little 6 Click Score: 20    End of Session Equipment Utilized During Treatment: Gait belt Activity Tolerance: Patient tolerated treatment well Patient left: in bed;with call bell/phone within reach;with bed alarm set Nurse Communication: Mobility status PT Visit  Diagnosis: Unsteadiness on feet (R26.81);Other abnormalities of gait and mobility (R26.89);Muscle weakness (generalized) (M62.81);Other symptoms and signs involving the nervous system (R29.898)     Time: 1308-6578 PT Time Calculation (min) (ACUTE ONLY): 22 min  Charges:    $Gait Training: 8-22 mins PT General Charges $$ ACUTE PT VISIT: 1 Visit                     Shela Nevin, PT, DPT Acute Rehab Services [phone]    Gladys Damme 11/08/2023, 12:08 PM

## 2023-11-08 NOTE — Progress Notes (Signed)
 PT Cancellation Note  Patient Details Name: [name] [name] MRN: [id number] DOB: Sep 14, 1980   Cancelled Treatment:    Reason Eval/Treat Not Completed: Patient declined, no reason specified (Pt politely declined due to nausea. Requests PT to come back later. Will follow up if time allows.)   Gladys Damme 11/08/2023, 9:47 AM

## 2023-11-08 NOTE — Discharge Summary (Addendum)
 Name: [name] [name] MRN: [id number] DOB: 03-14-1981 43 y.o. PCP: [facility]  Date of Admission: 11/05/2023  3:11 AM Date of Discharge: 11/08/2023 11/08/23 Attending Physician: Dr. Mayford Knife  DISCHARGE DIAGNOSIS:  Primary Problem: Acute encephalopathy   Hospital Problems: Principal Problem:   Acute encephalopathy Active Problems:   Nausea and vomiting   Hypertensive urgency   Chronic abdominal pain   Chronic, continuous use of opioids   Need for acute hemodialysis due to missed session(HCC)   History of simultaneous kidney and pancreas transplant (HCC)    DISCHARGE MEDICATIONS:   Allergies as of 11/08/2023   No Known Allergies      Medication List     TAKE these medications    Ambien 5 MG tablet Generic drug: zolpidem Take 5 mg by mouth at bedtime as needed for sleep.   amLODipine 10 MG tablet Commonly known as: NORVASC Take 1 tablet (10 mg total) by mouth daily.   aspirin EC 81 MG tablet Chew 81 mg by mouth daily.   Auryxia 1 GM 210 MG(Fe) tablet Generic drug: ferric citrate Take 210-630 mg by mouth See admin instructions. Take 3 tablets (630 mg) by mouth three times daily with meals, and take 3 tablets (630 mg) with snacks   busPIRone 10 MG tablet Commonly known as: BUSPAR Take 10 mg by mouth 2 (two) times daily.   carvedilol 12.5 MG tablet Commonly known as: COREG Take 1 tablet (12.5 mg total) by mouth 2 (two) times daily with a meal.   cinacalcet 30 MG tablet Commonly known as: SENSIPAR Take 3 tablets (90 mg total) by mouth daily with supper.   cycloSPORINE modified 25 MG capsule Commonly known as: NEORAL Take 25 mg by mouth daily.   cycloSPORINE modified 100 MG capsule Commonly known as: NEORAL Take 2 capsules (200 mg total) by mouth 2 (two) times daily.   Darbepoetin Alfa 60 MCG/0.3ML Sosy injection Commonly known as: ARANESP Inject 0.3 mLs (60 mcg total) into the vein every Friday with hemodialysis.   doxercalciferol 4 MCG/2ML  injection Commonly known as: HECTOROL Inject 3 mLs (6 mcg total) into the vein every Monday, Wednesday, and Friday with hemodialysis.   DULoxetine 20 MG capsule Commonly known as: CYMBALTA Take 1 capsule (20 mg total) by mouth daily.   gabapentin 100 MG capsule Commonly known as: NEURONTIN Take 100 mg by mouth 2 (two) times daily.   hydrALAZINE 25 MG tablet Commonly known as: APRESOLINE Take 3 tablets (75 mg total) by mouth every 8 (eight) hours.   hydrOXYzine 25 MG capsule Commonly known as: VISTARIL Take 25 mg by mouth 3 (three) times daily as needed for anxiety.   lidocaine-prilocaine cream Commonly known as: EMLA Apply 1 application  topically daily as needed (prior to port access).   Lokelma 10 g Pack packet Generic drug: sodium zirconium cyclosilicate Take 10 g by mouth daily.   losartan 100 MG tablet Commonly known as: Cozaar Take 1 tablet (100 mg total) by mouth daily.   MELATONIN PO Take 1 tablet by mouth at bedtime.   metoCLOPramide 5 MG tablet Commonly known as: REGLAN Take 5 mg by mouth every 6 (six) hours as needed for nausea or vomiting.   mycophenolate 180 MG EC tablet Commonly known as: MYFORTIC Take 540 mg by mouth 2 (two) times daily.   ondansetron 4 MG tablet Commonly known as: ZOFRAN Take 4 mg by mouth every 8 (eight) hours as needed for nausea or vomiting.   oxyCODONE-acetaminophen 5-325 MG tablet  Commonly known as: PERCOCET/ROXICET Take 1 tablet by mouth 3 (three) times daily.   predniSONE 5 MG tablet Commonly known as: DELTASONE Take 15 mg by mouth daily.   Xphozah 30 MG Tabs Generic drug: Tenapanor HCl (CKD) Take 1 tablet by mouth 2 (two) times daily.        DISPOSITION AND FOLLOW-UP:  [name] [name] was discharged from [facility] in Fair condition. At the hospital follow up visit please address:  Follow-up Recommendations: Consults: Nephro Labs:  Renal function panel Studies: Gastric emptying scan   Medications: Oxycodone, mycophenolate, Reglan, cyclosporine, prednisone  HOSPITAL COURSE:  Patient Summary: #Acute Encephalopathy #ESRD #Hyperkalemia, resolved #Hyperuremia, improved Presented with 3-day history of nausea vomiting and a missed dialysis session.  Receives HD M/W/F. Noted to have had elevated potassium and BUN.  Received dialysis x 2 and Lokelma.  Mental status improved and stable at discharge. Will receive HD and patient counseled on the importance of going.   #Hx of Pancreatic/Renal Transplant, 2015  #Hx of Failed Renal Transplant, 2019 Per dispense history, unclear if patient had been adherent to medications prior to admission. Initially required IV Solu-Medrol but converted to home p.o. meds after improvement in nausea and vomiting.  Stable at discharge.  #Elevated tropes  Tropes elevated at 39 but plateaued at 69.  Stable at discharge  #Abdominal Pain 2/2 Gastroparesis  #Nausea and Vomiting  Generalized pain but worse in right upper quadrant. CT abdomen unremarkable for any acute findings or changes from 09/26/23. RUQ from 09/26/23 notable for cholelithiasis but no secondary signs for cholecystitis. Noted to have had bilious vomiting prior to admission. Gastric emptying scan held. Improvement after phenergan suppository. Patient continued to have improvement after IV Reglan.  Patient was able to safely tolerate transition from IV medications to p.o. prior to discharge.  Patient also able to tolerate food and drink without vomiting.  Would strongly consider gastric emptying scan outpatient.  #Hypertension Blood pressure elevated in the 180s to 200s.  Patient initially unable to tolerate home p.o. meds but could at discharge after improvement of nausea and vomiting.  Follow-up outpatient.    DISCHARGE INSTRUCTIONS:   Discharge Instructions     Call MD for:  difficulty breathing, headache or visual disturbances   Complete by: As directed    Call MD for:  extreme  fatigue   Complete by: As directed    Call MD for:  hives   Complete by: As directed    Call MD for:  persistant dizziness or light-headedness   Complete by: As directed    Call MD for:  persistant nausea and vomiting   Complete by: As directed    Call MD for:  redness, tenderness, or signs of infection (pain, swelling, redness, odor or green/yellow discharge around incision site)   Complete by: As directed    Call MD for:  severe uncontrolled pain   Complete by: As directed    Call MD for:  temperature >100.4   Complete by: As directed    Diet - low sodium heart healthy   Complete by: As directed    Discharge instructions   Complete by: As directed    [name] [name],   You were hospitalized for encephalopathy and an elevated potassium.  This was likely from missing your dialysis session.  During your hospitalization, you received dialysis and these abnormalities have normalized.  You were also hospitalized for nausea and vomiting.  This was likely from gastroparesis.  We have treated you with  a variety medications, and at discharge, you can tolerate oral medications.  Please continue to take your medications as prescribed.  Please make sure to go to your dialysis session tomorrow.  Please also make sure to follow-up with your PCP. We are glad that you are feeling better!   Increase activity slowly   Complete by: As directed        SUBJECTIVE:  Patient was evaluated at bedside. Notes improvement in nausea, vomiting, and abdominal pain since last night. She notes that she does not get thirsty when she gets dialysis. She endorses a mild appetite and was encouraged to eat as tolerated.  Discharge Vitals:   BP (!) 233/107 (BP Location: Left Arm)   Pulse 81   Temp (P) 98 F (36.7 C) (Oral)   Resp (!) 22   Ht 5\' 2"  (1.575 m)   Wt 57.3 kg   SpO2 99%   BMI 23.10 kg/m   OBJECTIVE:  Physical Exam Constitutional:      Appearance: Normal appearance.  HENT:     Head: Normocephalic and  atraumatic.  Neurological:     General: No focal deficit present.     Mental Status: She is alert. Mental status is at baseline.  Psychiatric:        Mood and Affect: Mood normal.        Behavior: Behavior normal.   Pertinent Labs, Studies, and Procedures:     Latest Ref Rng & Units 11/08/2023    6:33 AM 11/07/2023    6:24 AM 11/06/2023    7:36 AM  CBC  WBC 4.0 - 10.5 K/uL 3.3  3.8  3.8   Hemoglobin 12.0 - 15.0 g/dL 16.1  09.6  04.5   Hematocrit 36.0 - 46.0 % 35.6  32.5  32.7   Platelets 150 - 400 K/uL 106  100  108        Latest Ref Rng & Units 11/08/2023    6:33 AM 11/07/2023    6:24 AM 11/06/2023    7:36 AM  CMP  Glucose 70 - 99 mg/dL 409  811  85   BUN 6 - 20 mg/dL 34  58  43   Creatinine 0.44 - 1.00 mg/dL 9.14  7.82  9.56   Sodium 135 - 145 mmol/L 131  134  135   Potassium 3.5 - 5.1 mmol/L 4.8  5.1  5.3   Chloride 98 - 111 mmol/L 96  93  94   CO2 22 - 32 mmol/L 24  25  27    Calcium 8.9 - 10.3 mg/dL 8.8  8.8  8.7    DG Abd 1 View Result Date: 11/05/2023 CLINICAL DATA:  Nausea and vomiting EXAM: ABDOMEN - 1 VIEW COMPARISON:  None Available. FINDINGS: Scattered large and small bowel gas is noted. Postsurgical changes are noted. Diffuse vascular calcifications are seen. No obstructive changes are noted. No bony abnormality is seen. IMPRESSION: No acute abnormality noted. Electronically Signed   By: Alcide Clever M.D.   On: 11/05/2023 23:15   CT ABDOMEN PELVIS WO CONTRAST Result Date: 11/05/2023 CLINICAL DATA:  Right upper quadrant pain.  Missed dialysis. EXAM: CT ABDOMEN AND PELVIS WITHOUT CONTRAST TECHNIQUE: Multidetector CT imaging of the abdomen and pelvis was performed following the standard protocol without IV contrast. RADIATION DOSE REDUCTION: This exam was performed according to the departmental dose-optimization program which includes automated exposure control, adjustment of the mA and/or kV according to patient size and/or use of iterative reconstruction technique. COMPARISON:   09/26/2023  FINDINGS: Lower chest: Cardiomegaly. Extensive atheromatous calcification of the coronaries. Haziness at the lung bases is chronic when compared to prior. Hepatobiliary: No focal liver abnormality.Full gallbladder, calcified gallstones were better seen on prior given motion today. No pericholecystic edema detected. No bile duct dilatation. Pancreas: Native pancreas atrophy. 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 Spleen: Chronic enlargement without focal abnormality. Adrenals/Urinary Tract: Negative adrenals. Severe native renal atrophy, no hydronephrosis or inflammation detected. Unchanged appearance of atrophic transplant kidney in the right lower quadrant with superimposed cortical cysts. Collapsed urinary bladder without focal abnormality. 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 No superimposed acute finding. Vascular/Lymphatic: Extensive arterial calcification. No mass or adenopathy. Reproductive:Posterior uterine fibroid. Presumed follicle in the left ovary where there is asymmetric cystic density. Other: No ascites or pneumoperitoneum. Musculoskeletal: No acute abnormalities. 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 Presumed hemangioma in the T9 vertebral body. IMPRESSION: 1. No acute finding or change from 09/26/2023. 2. Cholelithiasis and other chronic findings as described. Electronically Signed   By: Tiburcio Pea M.D.   On: 11/05/2023 08:54   CT Head Wo Contrast Result Date: 11/05/2023 CLINICAL DATA:  Provided history: Headache, increasing frequency or severity. EXAM: CT HEAD WITHOUT CONTRAST TECHNIQUE: Contiguous axial images were obtained from the base of the skull through the vertex without intravenous contrast. RADIATION DOSE REDUCTION: This exam was performed according to the departmental dose-optimization program which includes automated  exposure control, adjustment of the mA and/or kV according to patient size and/or use of iterative reconstruction technique. COMPARISON:  Head CT 01/30/2022.  Brain MRI 12/24/2021. FINDINGS: Brain: Generalized cerebral atrophy. Patchy and ill-defined hypoattenuation within the cerebral white matter, nonspecific but compatible with mild chronic small vessel ischemic disease. Chronic lacunar infarcts within the right thalamus and right aspect of the pons, some of which were better appreciated on the prior brain MRI of 12/24/2021. Chronic infarct again demonstrated within the inferomedial aspect of the left cerebellar hemisphere. Small infarct within the superomedial right cerebellar hemisphere, new from the prior head CT of 01/30/2022 but chronic in appearance (series 5, image 10). There is no acute intracranial hemorrhage. No demarcated cortical infarct. No extra-axial fluid collection. No evidence of an intracranial mass. No midline shift. Vascular: No hyperdense vessel.  Atherosclerotic calcifications. Skull: No calvarial fracture or aggressive osseous lesion. Sinuses/Orbits: No mass or acute finding within the imaged orbits. No significant paranasal sinus disease at the imaged levels. IMPRESSION: 1. No evidence of an acute intracranial abnormality. 2. Parenchymal atrophy, chronic small vessel ischemic disease and chronic infarcts, as described. Electronically Signed   By: Jackey Loge D.O.   On: 11/05/2023 08:42    Signed: Morrie Sheldon, MD Internal Medicine Resident, PGY-1 [facility]  Pager: [phone]

## 2023-11-08 NOTE — Hospital Course (Signed)
#  Acute Encephalopathy #ESRD #Hyperkalemia, resolved #Hyperuremia, improved Presented with 3-day history of nausea vomiting and a missed dialysis session.  Receives HD M/W/F. Noted to have had elevated potassium and BUN.  Received dialysis x 2 and Lokelma.  Mental status improved and stable at discharge. Will receive HD and patient counseled on the importance of going.   #Hx of Pancreatic/Renal Transplant, 2015  #Hx of Failed Renal Transplant, 2019 Per dispense history, unclear if patient had been adherent to medications prior to admission. Initially required IV Solu-Medrol but converted to home p.o. meds after improvement in nausea and vomiting.  Stable at discharge.  #Elevated tropes  Tropes elevated at 39 but plateaued at 31.  Stable at discharge  #Abdominal Pain 2/2 Gastroparesis  #Nausea and Vomiting  Generalized pain but worse in right upper quadrant. CT abdomen unremarkable for any acute findings or changes from 09/26/23. RUQ from 09/26/23 notable for cholelithiasis but no secondary signs for cholecystitis. Noted to have had bilious vomiting prior to admission. Gastric emptying scan held. Improvement after phenergan suppository. Patient continued to have improvement after IV Reglan.  Patient was able to safely tolerate transition from IV medications to p.o. prior to discharge.  Patient also able to tolerate food and drink without vomiting.  Would strongly consider gastric emptying scan outpatient.  #Hypertension Blood pressure elevated in the 180s to 200s.  Patient initially unable to tolerate home p.o. meds but could at discharge after improvement of nausea and vomiting.  Follow-up outpatient.

## 2023-11-08 NOTE — Progress Notes (Deleted)
 HD#2 SUBJECTIVE:  Patient Summary: [name] [name] is a 43 year old with 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    Overnight Events: Continues to experience persistent nausea, vomiting, and abdominal pain.  IV Zofran and rectal Phenergan have not been very effective.  She has been unable to eat or take oral medications.  Scopolamine patch was added.  Tried IV Benadryl.  Also given Solu-Medrol rather than p.o. prednisone as patient has not been taking this.  Also given IV Tylenol with avoidance of IV opiates if possible.  Interim History: Patient was evaluated at bedside. Notes improvement in nausea, vomiting, and abdominal pain since last night. She notes that she does not get thirsty when she gets dialysis. She endorses a mild appetite and was encouraged to eat as tolerated.   OBJECTIVE:  Vital Signs: Vitals:   11/07/23 2021 11/07/23 2355 11/08/23 0422 11/08/23 0722  BP: (!) 203/86 (!) 212/98 (!) 233/107   Pulse: 78 77 81   Resp: 14 13 (!) 22   Temp: 98.4 F (36.9 C) 98 F (36.7 C) 97.8 F (36.6 C) (P) 98 F (36.7 C)  TempSrc: Oral Oral Oral (P) Oral  SpO2: 98% 96% 99%   Weight:      Height:       Supplemental O2: Room Air SpO2: 99 % O2 Flow Rate (L/min): 2 L/min  Filed Weights   11/07/23 0419 11/07/23 1013 11/07/23 1400  Weight: 56.1 kg 57.3 kg 57.3 kg     Intake/Output Summary (Last 24 hours) at 11/08/2023 0752 Last data filed at 11/07/2023 1430 Gross per 24 hour  Intake --  Output 2 ml  Net -2 ml   Net IO Since Admission: -205 mL [11/08/23 0752]  CBC    Component Value Date/Time   WBC 3.3 (L) 11/08/2023 0633   RBC 4.06 11/08/2023 0633   HGB 12.1 11/08/2023 0633   HCT 35.6 (L) 11/08/2023 0633   PLT 106 (L) 11/08/2023 0633   MCV 87.7  11/08/2023 0633   MCH 29.8 11/08/2023 0633   MCHC 34.0 11/08/2023 0633   RDW 12.1 11/08/2023 0633   LYMPHSABS 1.0 11/05/2023 0343   MONOABS 0.2 11/05/2023 0343   EOSABS 0.1 11/05/2023 0343   BASOSABS 0.0 11/05/2023 0343   CMP     Component Value Date/Time   NA 134 (L) 11/07/2023 0624   K 5.1 11/07/2023 0624   CL 93 (L) 11/07/2023 0624   CO2 25 11/07/2023 0624   GLUCOSE 107 (H) 11/07/2023 0624   BUN 58 (H) 11/07/2023 0624   CREATININE 8.21 (H) 11/07/2023 0624   CALCIUM 8.8 (L) 11/07/2023 0624   PROT 7.9 11/05/2023 0343   ALBUMIN 3.2 (L) 11/07/2023 0624   AST 11 (L) 11/05/2023 0343   ALT 10 11/05/2023 0343   ALKPHOS 91 11/05/2023 0343   BILITOT 1.2 11/05/2023 0343   GFRNONAA 6 (L) 11/07/2023 0624   Physical Exam Constitutional:      Appearance: Normal appearance.  HENT:     Head: Normocephalic and atraumatic.  Neurological:     General: No focal deficit present.     Mental Status: She is alert. Mental status is at baseline.  Psychiatric:        Mood and Affect: Mood normal.  Behavior: Behavior normal.    CBC    Component Value Date/Time   WBC 3.3 (L) 11/08/2023 0633   RBC 4.06 11/08/2023 0633   HGB 12.1 11/08/2023 0633   HCT 35.6 (L) 11/08/2023 0633   PLT 106 (L) 11/08/2023 0633   MCV 87.7 11/08/2023 0633   MCH 29.8 11/08/2023 0633   MCHC 34.0 11/08/2023 0633   RDW 12.1 11/08/2023 0633   LYMPHSABS 1.0 11/05/2023 0343   MONOABS 0.2 11/05/2023 0343   EOSABS 0.1 11/05/2023 0343   BASOSABS 0.0 11/05/2023 0343   CMP     Component Value Date/Time   NA 134 (L) 11/07/2023 0624   K 5.1 11/07/2023 0624   CL 93 (L) 11/07/2023 0624   CO2 25 11/07/2023 0624   GLUCOSE 107 (H) 11/07/2023 0624   BUN 58 (H) 11/07/2023 0624   CREATININE 8.21 (H) 11/07/2023 0624   CALCIUM 8.8 (L) 11/07/2023 0624   PROT 7.9 11/05/2023 0343   ALBUMIN 3.2 (L) 11/07/2023 0624   AST 11 (L) 11/05/2023 0343   ALT 10 11/05/2023 0343   ALKPHOS 91 11/05/2023 0343   BILITOT 1.2  11/05/2023 0343   GFRNONAA 6 (L) 11/07/2023 0624   ASSESSMENT/PLAN:  Assessment: Principal Problem:   Acute encephalopathy  [name] [name] is a 43 year old with 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  Plan: #Acute Encephalopathy 2/2 Hyperuremia, improving #ESRD #Hyperkalemia, resolved Nephro following. Tolerated HD well yesterday. Potassium has been stable at 4.8 (yesterday = 5.1). BUN improved from 58 to 34.  - Follow-up nephrology - Trend renal function panel  #Gastroparesis #Abdominal pain #Nausea/vomiting Nausea/vomiting and generalized abdominal discomfort improving. She has been unable to tolerate PO meds and meals. Scopolamine patch added yesterday. Will also trial IV Reglan before meals to address gastroparesis. Will also try to avoid IV opiates and use IV Tylenol.  - Continue scopolamine patch and PRN phenergan suppository  - Continue home oxycodone-acetaminophen  - Trial IV Reglan 5 mg q8h PRN  - Continue bowel regimen of cinacalcet and PRN Dulcolax suppository   #Hypertension Remains elevated with SBP in the 200s. Patient has been unable to tolerate PO home meds. Will increase home clonidine patch dosage (pt notes she takes 0.2 mg at home). Will attempt PO meds after IV Regland. If patient still endorses N/V, will trial IV hydralazine until patient can tolerate her home PO meds.  - Lopressor 10 mg IV q6h PRN  - Increase clonidine patch to 0.2 mg  - Resume home meds as tolerated   #Hx of Pancreatic/Renal Transplant, 2015  #Hx of Failed Renal Transplant, 2019 Glucose stable. Has not been able to take prednisone or cyclosporine as these are PO. She was given  loading dose of IV solumedrol. Until patient can tolerate PO meds, will continue with  the IV solumedrol.  - Continue IV Solu-Medrol until PO tolerated  - Home prednisone and cyclosporine as tolerated  Chronic Conditions:  #Thrombocytopenia: Stable  #Normocytic Anemia: Stable #Anxiety: Continue home BuSpar.  Best Practice: Diet: Renal IVF: Fluids: None, Rate: None VTE: heparin injection 5,000 Units Start: 11/05/23 1400 Code: Full AB: None Therapy Recs:  None , DME: other TBD Family Contact: friend Sheria Lang, to be notified. DISPO: Anticipated discharge  TBD  to Home pending  medication reconciliation and electrolyte abnormalities .  Signature: Morrie Sheldon, MD Internal Medicine Resident, PGY-1 [facility]  Pager: [phone]  Please contact the on call pager after 5 pm and on weekends at [phone].

## 2023-11-08 NOTE — Progress Notes (Signed)
 Patient notified RN that she needs to go home today in order to care for her dog. She is aware per the MD conversation at bedside this morning the plan was not to discharge today, but she adamant about leaving. The provider Dr. Rayvon Char has been notified and called into the room to speak with the patient. Patient will be discharged by the healthcare team.

## 2023-11-08 NOTE — Progress Notes (Signed)
D/C order noted. Contacted [facility] to be advised of pt's d/c today and that pt should resume care tomorrow.   Olivia Canter Renal Navigator [phone]

## 2023-11-08 NOTE — Discharge Planning (Signed)
 [facility] Patient Discharge Orders- [facility] CLINIC: [facility]  Patient's name: [name] [name] Admit/DC Dates: 11/05/2023 - 11/08/2023  Discharge Diagnoses: Hyperkalemia   Acute encephalopathy    Recent Labs  Lab 11/08/23 0633  HGB 12.1  K 4.8  CALCIUM 8.8*  PHOS 5.6*  ALBUMIN 3.5   Aranesp: Given: --   Date of last dose/amount: --   PRBC's Given: -- Date/# of units: -- ESA dose for discharge: --  Outpatient Dialysis Orders:  -Heparin: No change  -EDW No change   -[place]: No change   Access intervention/Change: ---  -IV Antibiotics: ---  OTHER/APPTS   Completed by: Tomasa Blase PA-C   D/C Meds to be reconciled by nurse after every discharge.    Reviewed by: MD:______ RN_______

## 2023-11-08 NOTE — Plan of Care (Signed)
  Problem: Clinical Measurements: Goal: Ability to maintain clinical measurements within normal limits will improve Outcome: Progressing Goal: Will remain free from infection Outcome: Progressing   Problem: Nutrition: Goal: Adequate nutrition will be maintained Outcome: Progressing   Problem: Safety: Goal: Ability to remain free from injury will improve Outcome: Progressing   Problem: Skin Integrity: Goal: Risk for impaired skin integrity will decrease Outcome: Progressing

## 2023-11-08 NOTE — TOC Transition Note (Signed)
 Transition of Care [facility]) - Discharge Note   Patient Details  Name: [name] [name] MRN: [id number] Date of Birth: 1981/05/05  Transition of Care [facility]) CM/SW Contact:  Kermit Balo, RN Phone Number: 11/08/2023, 11:38 AM   Clinical Narrative:     Pt is discharging home with self care. No follow up per therapies.  Pt denies issues with her home medications. She states she takes her medications as ordered.  She uses medicaid transportation and has friends that assist.  DME at home: walker Pt has transportation home.  Final next level of care: Home/Self Care Barriers to Discharge: No Barriers Identified   Patient Goals and CMS Choice            Discharge Placement                       Discharge Plan and Services Additional resources added to the After Visit Summary for                                       Social Drivers of Health (SDOH) Interventions SDOH Screenings   Food Insecurity: No Food Insecurity (11/06/2023)  Housing: Low Risk  (11/06/2023)  Transportation Needs: No Transportation Needs (11/06/2023)  Utilities: Not At Risk (11/06/2023)  Social Connections: Unknown (10/04/2022)   Received from [facility]  Tobacco Use: Medium Risk (11/05/2023)     Readmission Risk Interventions    11/17/2021   12:15 PM  Readmission Risk Prevention Plan  Transportation Screening Complete  Medication Review (RN Care Manager) Complete  PCP or Specialist appointment within 3-5 days of discharge Complete  HRI or Home Care Consult Complete  SW Recovery Care/Counseling Consult Complete  Palliative Care Screening Not Applicable  Skilled Nursing Facility Patient Refused

## 2023-11-09 ENCOUNTER — Telehealth (HOSPITAL_COMMUNITY): Payer: Self-pay | Admitting: Nephrology

## 2023-11-09 NOTE — Telephone Encounter (Signed)
 Transition of care contact from inpatient facility  Date of Discharge: 11/08/23 Date of Contact:11/09/23 - attempt #1 Method of contact: Phone  Attempted to contact patient to discuss transition of care from inpatient admission. Patient did not answer the phone. Message was left on the patient's voicemail with call back number [phone].  Ozzie Hoyle, PA-C [employer] Pager [phone]

## 2023-11-11 LAB — CULTURE, BLOOD (ROUTINE X 2)
Culture: NO GROWTH
Culture: NO GROWTH
Special Requests: ADEQUATE

## 2023-11-12 DIAGNOSIS — Z992 Dependence on renal dialysis: Secondary | ICD-10-CM

## 2023-11-12 DIAGNOSIS — F1190 Opioid use, unspecified, uncomplicated: Secondary | ICD-10-CM | POA: Diagnosis present

## 2023-11-12 DIAGNOSIS — Z9483 Pancreas transplant status: Secondary | ICD-10-CM

## 2024-01-08 ENCOUNTER — Encounter: Payer: Self-pay | Admitting: Nephrology

## 2024-02-13 ENCOUNTER — Emergency Department (HOSPITAL_COMMUNITY)

## 2024-02-13 ENCOUNTER — Encounter (HOSPITAL_COMMUNITY): Payer: Self-pay | Admitting: Emergency Medicine

## 2024-02-13 ENCOUNTER — Other Ambulatory Visit: Payer: Self-pay

## 2024-02-13 ENCOUNTER — Inpatient Hospital Stay (HOSPITAL_COMMUNITY)
Admission: EM | Admit: 2024-02-13 | Discharge: 2024-04-01 | DRG: 023 | Disposition: A | Discharge status: Other Acute Inpatient Hospital | Attending: Pulmonary Disease | Admitting: Pulmonary Disease

## 2024-02-13 DIAGNOSIS — Z79624 Long term (current) use of inhibitors of nucleotide synthesis: Secondary | ICD-10-CM

## 2024-02-13 DIAGNOSIS — A419 Sepsis, unspecified organism: Secondary | ICD-10-CM | POA: Diagnosis present

## 2024-02-13 DIAGNOSIS — E440 Moderate protein-calorie malnutrition: Secondary | ICD-10-CM | POA: Diagnosis present

## 2024-02-13 DIAGNOSIS — G911 Obstructive hydrocephalus: Secondary | ICD-10-CM | POA: Diagnosis present

## 2024-02-13 DIAGNOSIS — G936 Cerebral edema: Secondary | ICD-10-CM | POA: Diagnosis present

## 2024-02-13 DIAGNOSIS — J9601 Acute respiratory failure with hypoxia: Secondary | ICD-10-CM | POA: Diagnosis present

## 2024-02-13 DIAGNOSIS — R1312 Dysphagia, oropharyngeal phase: Secondary | ICD-10-CM | POA: Diagnosis present

## 2024-02-13 DIAGNOSIS — I161 Hypertensive emergency: Secondary | ICD-10-CM | POA: Diagnosis present

## 2024-02-13 DIAGNOSIS — G935 Compression of brain: Secondary | ICD-10-CM | POA: Diagnosis present

## 2024-02-13 DIAGNOSIS — R935 Abnormal findings on diagnostic imaging of other abdominal regions, including retroperitoneum: Secondary | ICD-10-CM | POA: Diagnosis present

## 2024-02-13 DIAGNOSIS — I169 Hypertensive crisis, unspecified: Secondary | ICD-10-CM | POA: Diagnosis present

## 2024-02-13 DIAGNOSIS — N2581 Secondary hyperparathyroidism of renal origin: Secondary | ICD-10-CM | POA: Diagnosis present

## 2024-02-13 DIAGNOSIS — Z4659 Encounter for fitting and adjustment of other gastrointestinal appliance and device: Secondary | ICD-10-CM

## 2024-02-13 DIAGNOSIS — Z992 Dependence on renal dialysis: Secondary | ICD-10-CM

## 2024-02-13 DIAGNOSIS — R29726 NIHSS score 26: Secondary | ICD-10-CM | POA: Diagnosis present

## 2024-02-13 DIAGNOSIS — H4923 Sixth [abducent] nerve palsy, bilateral: Secondary | ICD-10-CM | POA: Diagnosis present

## 2024-02-13 DIAGNOSIS — K3184 Gastroparesis: Secondary | ICD-10-CM | POA: Diagnosis present

## 2024-02-13 DIAGNOSIS — I609 Nontraumatic subarachnoid hemorrhage, unspecified: Secondary | ICD-10-CM

## 2024-02-13 DIAGNOSIS — J690 Pneumonitis due to inhalation of food and vomit: Secondary | ICD-10-CM | POA: Diagnosis present

## 2024-02-13 DIAGNOSIS — D61818 Other pancytopenia: Secondary | ICD-10-CM | POA: Diagnosis present

## 2024-02-13 DIAGNOSIS — J810 Acute pulmonary edema: Secondary | ICD-10-CM | POA: Diagnosis present

## 2024-02-13 DIAGNOSIS — Z8639 Personal history of other endocrine, nutritional and metabolic disease: Secondary | ICD-10-CM

## 2024-02-13 DIAGNOSIS — I615 Nontraumatic intracerebral hemorrhage, intraventricular: Principal | ICD-10-CM | POA: Diagnosis present

## 2024-02-13 DIAGNOSIS — K8020 Calculus of gallbladder without cholecystitis without obstruction: Secondary | ICD-10-CM | POA: Diagnosis present

## 2024-02-13 DIAGNOSIS — E1043 Type 1 diabetes mellitus with diabetic autonomic (poly)neuropathy: Secondary | ICD-10-CM | POA: Diagnosis present

## 2024-02-13 DIAGNOSIS — I160 Hypertensive urgency: Secondary | ICD-10-CM | POA: Diagnosis present

## 2024-02-13 DIAGNOSIS — D84821 Immunodeficiency due to drugs: Secondary | ICD-10-CM | POA: Diagnosis present

## 2024-02-13 DIAGNOSIS — E872 Acidosis, unspecified: Secondary | ICD-10-CM | POA: Diagnosis present

## 2024-02-13 DIAGNOSIS — Z9483 Pancreas transplant status: Secondary | ICD-10-CM

## 2024-02-13 DIAGNOSIS — H5500 Unspecified nystagmus: Secondary | ICD-10-CM | POA: Diagnosis present

## 2024-02-13 DIAGNOSIS — Z79899 Other long term (current) drug therapy: Secondary | ICD-10-CM

## 2024-02-13 DIAGNOSIS — Z87891 Personal history of nicotine dependence: Secondary | ICD-10-CM

## 2024-02-13 DIAGNOSIS — G8929 Other chronic pain: Secondary | ICD-10-CM | POA: Diagnosis present

## 2024-02-13 DIAGNOSIS — R112 Nausea with vomiting, unspecified: Principal | ICD-10-CM | POA: Diagnosis present

## 2024-02-13 DIAGNOSIS — T380X5A Adverse effect of glucocorticoids and synthetic analogues, initial encounter: Secondary | ICD-10-CM | POA: Diagnosis present

## 2024-02-13 DIAGNOSIS — I613 Nontraumatic intracerebral hemorrhage in brain stem: Secondary | ICD-10-CM

## 2024-02-13 DIAGNOSIS — E875 Hyperkalemia: Secondary | ICD-10-CM | POA: Diagnosis present

## 2024-02-13 DIAGNOSIS — Z681 Body mass index (BMI) 19 or less, adult: Secondary | ICD-10-CM

## 2024-02-13 DIAGNOSIS — E871 Hypo-osmolality and hyponatremia: Secondary | ICD-10-CM | POA: Diagnosis present

## 2024-02-13 DIAGNOSIS — T8612 Kidney transplant failure: Secondary | ICD-10-CM | POA: Diagnosis present

## 2024-02-13 DIAGNOSIS — I120 Hypertensive chronic kidney disease with stage 5 chronic kidney disease or end stage renal disease: Secondary | ICD-10-CM | POA: Diagnosis present

## 2024-02-13 DIAGNOSIS — Z8249 Family history of ischemic heart disease and other diseases of the circulatory system: Secondary | ICD-10-CM

## 2024-02-13 DIAGNOSIS — K529 Noninfective gastroenteritis and colitis, unspecified: Secondary | ICD-10-CM | POA: Diagnosis present

## 2024-02-13 DIAGNOSIS — Z781 Physical restraint status: Secondary | ICD-10-CM

## 2024-02-13 DIAGNOSIS — Z9489 Other transplanted organ and tissue status: Secondary | ICD-10-CM

## 2024-02-13 DIAGNOSIS — Z91158 Patient's noncompliance with renal dialysis for other reason: Secondary | ICD-10-CM

## 2024-02-13 DIAGNOSIS — Z609 Problem related to social environment, unspecified: Secondary | ICD-10-CM | POA: Diagnosis present

## 2024-02-13 DIAGNOSIS — J90 Pleural effusion, not elsewhere classified: Secondary | ICD-10-CM | POA: Diagnosis present

## 2024-02-13 DIAGNOSIS — G9341 Metabolic encephalopathy: Secondary | ICD-10-CM | POA: Diagnosis present

## 2024-02-13 DIAGNOSIS — R9431 Abnormal electrocardiogram [ECG] [EKG]: Secondary | ICD-10-CM | POA: Diagnosis present

## 2024-02-13 DIAGNOSIS — E8809 Other disorders of plasma-protein metabolism, not elsewhere classified: Secondary | ICD-10-CM | POA: Diagnosis present

## 2024-02-13 DIAGNOSIS — E1022 Type 1 diabetes mellitus with diabetic chronic kidney disease: Secondary | ICD-10-CM | POA: Diagnosis present

## 2024-02-13 DIAGNOSIS — Y830 Surgical operation with transplant of whole organ as the cause of abnormal reaction of the patient, or of later complication, without mention of misadventure at the time of the procedure: Secondary | ICD-10-CM | POA: Diagnosis present

## 2024-02-13 DIAGNOSIS — D631 Anemia in chronic kidney disease: Secondary | ICD-10-CM | POA: Diagnosis present

## 2024-02-13 DIAGNOSIS — I3139 Other pericardial effusion (noninflammatory): Secondary | ICD-10-CM | POA: Diagnosis present

## 2024-02-13 DIAGNOSIS — Z7952 Long term (current) use of systemic steroids: Secondary | ICD-10-CM

## 2024-02-13 DIAGNOSIS — G910 Communicating hydrocephalus: Secondary | ICD-10-CM | POA: Diagnosis present

## 2024-02-13 DIAGNOSIS — E118 Type 2 diabetes mellitus with unspecified complications: Secondary | ICD-10-CM | POA: Diagnosis present

## 2024-02-13 DIAGNOSIS — F1120 Opioid dependence, uncomplicated: Secondary | ICD-10-CM | POA: Diagnosis present

## 2024-02-13 DIAGNOSIS — Z940 Kidney transplant status: Secondary | ICD-10-CM

## 2024-02-13 DIAGNOSIS — Z7982 Long term (current) use of aspirin: Secondary | ICD-10-CM

## 2024-02-13 DIAGNOSIS — G9389 Other specified disorders of brain: Secondary | ICD-10-CM | POA: Diagnosis present

## 2024-02-13 DIAGNOSIS — E8770 Fluid overload, unspecified: Secondary | ICD-10-CM | POA: Diagnosis not present

## 2024-02-13 DIAGNOSIS — N186 End stage renal disease: Secondary | ICD-10-CM

## 2024-02-13 HISTORY — DX: Cerebral infarction, unspecified: I63.9

## 2024-02-13 LAB — CBC
HCT: 37.3 % (ref 36.0–46.0)
Hemoglobin: 11.9 g/dL — ABNORMAL LOW (ref 12.0–15.0)
MCH: 30.2 pg (ref 26.0–34.0)
MCHC: 31.9 g/dL (ref 30.0–36.0)
MCV: 94.7 fL (ref 80.0–100.0)
Platelets: 91 10*3/uL — ABNORMAL LOW (ref 150–400)
RBC: 3.94 MIL/uL (ref 3.87–5.11)
RDW: 12.6 % (ref 11.5–15.5)
WBC: 4.4 10*3/uL (ref 4.0–10.5)
nRBC: 0 % (ref 0.0–0.2)

## 2024-02-13 LAB — COMPREHENSIVE METABOLIC PANEL WITH GFR
ALT: 11 U/L (ref 0–44)
AST: 16 U/L (ref 15–41)
Albumin: 3.9 g/dL (ref 3.5–5.0)
Alkaline Phosphatase: 92 U/L (ref 38–126)
Anion gap: 19 — ABNORMAL HIGH (ref 5–15)
BUN: 28 mg/dL — ABNORMAL HIGH (ref 6–20)
CO2: 24 mmol/L (ref 22–32)
Calcium: 9.6 mg/dL (ref 8.9–10.3)
Chloride: 93 mmol/L — ABNORMAL LOW (ref 98–111)
Creatinine, Ser: 5.38 mg/dL — ABNORMAL HIGH (ref 0.44–1.00)
GFR, Estimated: 10 mL/min — ABNORMAL LOW (ref >60)
Glucose, Bld: 122 mg/dL — ABNORMAL HIGH (ref 70–99)
Potassium: 5.8 mmol/L — ABNORMAL HIGH (ref 3.5–5.1)
Sodium: 136 mmol/L (ref 135–145)
Total Bilirubin: 0.8 mg/dL (ref 0.0–1.2)
Total Protein: 7.6 g/dL (ref 6.5–8.1)

## 2024-02-13 LAB — LIPASE, BLOOD: Lipase: 35 U/L (ref 11–51)

## 2024-02-13 LAB — HCG, SERUM, QUALITATIVE: Preg, Serum: NEGATIVE

## 2024-02-13 MED ORDER — ONDANSETRON HCL 4 MG/2ML IJ SOLN
4.0000 mg | Freq: Once | INTRAMUSCULAR | Status: AC | PRN
  Administered 2024-02-13: 4 mg via INTRAVENOUS
  Filled 2024-02-13: qty 2

## 2024-02-13 MED ORDER — HYDROMORPHONE HCL 1 MG/ML IJ SOLN
1.0000 mg | Freq: Once | INTRAMUSCULAR | Status: AC
  Administered 2024-02-13: 1 mg via INTRAVENOUS
  Filled 2024-02-13: qty 1

## 2024-02-13 MED ORDER — FENTANYL CITRATE PF 50 MCG/ML IJ SOSY
100.0000 ug | PREFILLED_SYRINGE | Freq: Once | INTRAMUSCULAR | Status: AC
  Administered 2024-02-13: 100 ug via INTRAVENOUS
  Filled 2024-02-13: qty 2

## 2024-02-13 MED ORDER — SODIUM CHLORIDE 0.9 % IV BOLUS
500.0000 mL | Freq: Once | INTRAVENOUS | Status: AC
  Administered 2024-02-13: 500 mL via INTRAVENOUS

## 2024-02-13 NOTE — ED Triage Notes (Signed)
 BIB [facility] from home/. Pt c/o N/V since after dialysis. Pt has had 2 episodes of vomiting since 0900 this AM, Abd pain and right as well as dizziness. Inconsolable, yelling help me, dry heaves noted. Pt reports multiple near syncopal episodes.   BP 240/110 (threw up BP meds) HR 78 Resp 18 Spo2 97 RA

## 2024-02-13 NOTE — ED Provider Notes (Signed)
 [place] EMERGENCY DEPARTMENT AT [facility] Provider Note   CSN: [id number] Arrival date & time: 02/13/24  2002     History  Chief Complaint  Patient presents with   Emesis    [name] [name] is a 43 y.o. female.  With a history of ESRD on dialysis, type 1 diabetes, status post pancreatic transplant who presents to the ED for vomiting.  Patient underwent hemodialysis earlier today.  Completed nearly all of her session.  Returned home but then developed severe abdominal pain along with nausea vomiting.  Symptoms have persisted since the onset earlier tonight.  No fevers chills chest pain or shortness of breath.   Emesis      Home Medications Prior to Admission medications   Medication Sig Start Date End Date Taking? Authorizing Provider  amLODipine  (NORVASC ) 10 MG tablet Take 1 tablet (10 mg total) by mouth daily. 02/05/22 11/06/23  Hoyt Macleod, MD  aspirin  EC 81 MG tablet Chew 81 mg by mouth daily.    [provider]  busPIRone  (BUSPAR ) 10 MG tablet Take 10 mg by mouth 2 (two) times daily. 10/25/23   [provider]  carvedilol  (COREG ) 12.5 MG tablet Take 1 tablet (12.5 mg total) by mouth 2 (two) times daily with a meal. Patient not taking: Reported on 11/06/2023 02/05/22 11/06/23  Hoyt Macleod, MD  cinacalcet  (SENSIPAR ) 30 MG tablet Take 3 tablets (90 mg total) by mouth daily with supper. 06/14/23   Amin, Sumayya, MD  cycloSPORINE  modified (NEORAL ) 100 MG capsule Take 2 capsules (200 mg total) by mouth 2 (two) times daily. 12/25/21   Pokhrel, Laxman, MD  cycloSPORINE  modified (NEORAL ) 25 MG capsule Take 25 mg by mouth daily.    [provider]  Darbepoetin Alfa  (ARANESP ) 60 MCG/0.3ML SOSY injection Inject 0.3 mLs (60 mcg total) into the vein every Friday with hemodialysis. 11/18/21   Abbe Abate, MD  doxercalciferol  (HECTOROL ) 4 MCG/2ML injection Inject 3 mLs (6 mcg total) into the vein every Monday, Wednesday, and Friday with hemodialysis.  11/17/21   Abbe Abate, MD  DULoxetine  (CYMBALTA ) 20 MG capsule Take 1 capsule (20 mg total) by mouth daily. 02/06/22 11/06/23  Hoyt Macleod, MD  ferric citrate  (AURYXIA ) 1 GM 210 MG(Fe) tablet Take 210-630 mg by mouth See admin instructions. Take 3 tablets (630 mg) by mouth three times daily with meals, and take 3 tablets (630 mg) with snacks    [provider]  gabapentin  (NEURONTIN ) 100 MG capsule Take 100 mg by mouth 2 (two) times daily. 10/25/23   [provider]  hydrALAZINE  (APRESOLINE ) 25 MG tablet Take 3 tablets (75 mg total) by mouth every 8 (eight) hours. 02/05/22 11/06/23  Hoyt Macleod, MD  hydrOXYzine  (VISTARIL ) 25 MG capsule Take 25 mg by mouth 3 (three) times daily as needed for anxiety. 05/26/23   [provider]  lidocaine -prilocaine  (EMLA ) cream Apply 1 application  topically daily as needed (prior to port access). 07/09/20   [provider]  losartan  (COZAAR ) 100 MG tablet Take 1 tablet (100 mg total) by mouth daily. 12/04/21 11/06/23  [place], Rema Care, DO  MELATONIN PO Take 1 tablet by mouth at bedtime.    [provider]  metoCLOPramide  (REGLAN ) 5 MG tablet Take 5 mg by mouth every 6 (six) hours as needed for nausea or vomiting.    [provider]  mycophenolate  (MYFORTIC ) 180 MG EC tablet Take 540 mg by mouth 2 (two) times daily.    [provider]  ondansetron  (ZOFRAN ) 4 MG tablet Take 4 mg by mouth every 8 (eight) hours as needed for nausea or vomiting.    [provider]  oxyCODONE -acetaminophen  (PERCOCET/ROXICET) 5-325 MG tablet Take 1 tablet by mouth 3 (three) times daily.    [provider]  predniSONE  (DELTASONE ) 5 MG tablet Take 15 mg by mouth daily. 06/24/15   [provider]  sodium zirconium cyclosilicate  (LOKELMA ) 10 g PACK packet Take 10 g by mouth daily.    [provider]  XPHOZAH 30 MG TABS Take 1 tablet by mouth 2 (two) times daily. 06/21/23   [provider]   zolpidem  (AMBIEN ) 5 MG tablet Take 5 mg by mouth at bedtime as needed for sleep.    [provider]      Allergies    Patient has no known allergies.    Review of Systems   Review of Systems  Gastrointestinal:  Positive for vomiting.    Physical Exam Updated Vital Signs BP (!) 185/83   Pulse 81   Temp (!) 96 F (35.6 C) (Rectal)   Resp (!) 22   Ht 5' 7.5 (1.715 m)   Wt 59.3 kg   LMP 02/13/2024 (Exact Date)   SpO2 96%   BMI 20.17 kg/m  Physical Exam Vitals and nursing note reviewed.  Constitutional:      General: She is in acute distress.  HENT:     Head: Normocephalic and atraumatic.  Eyes:     Pupils: Pupils are equal, round, and reactive to light.  Cardiovascular:     Rate and Rhythm: Normal rate and regular rhythm.  Pulmonary:     Effort: Pulmonary effort is normal.     Breath sounds: Normal breath sounds.  Abdominal:     Palpations: Abdomen is soft.     Tenderness: There is abdominal tenderness.     Comments: Diffuse tenderness to palpation, exam limited secondary to distress  Skin:    General: Skin is warm and dry.  Neurological:     Mental Status: She is alert.     Sensory: No sensory deficit.     Motor: No weakness.  Psychiatric:        Mood and Affect: Mood normal.     ED Results / Procedures / Treatments   Labs (all labs ordered are listed, but only abnormal results are displayed) Labs Reviewed  COMPREHENSIVE METABOLIC PANEL WITH GFR - Abnormal; Notable for the following components:      Result Value   Potassium 5.8 (*)    Chloride 93 (*)    Glucose, Bld 122 (*)    BUN 28 (*)    Creatinine, Ser 5.38 (*)    GFR, Estimated 10 (*)    Anion gap 19 (*)    All other components within normal limits  CBC - Abnormal; Notable for the following components:   Hemoglobin 11.9 (*)    Platelets 91 (*)    All other components within normal limits  LIPASE, BLOOD  HCG, SERUM, QUALITATIVE    EKG EKG Interpretation Date/Time:  Wednesday  February 13 2024 20:58:56 EDT Ventricular Rate:  71 PR Interval:  144 QRS Duration:  83 QT Interval:  475 QTC Calculation: 517 R Axis:   66  Text Interpretation: Sinus rhythm Minimal ST depression, inferior leads Prolonged QT interval Confirmed by Rafael Bun [phone]) on 02/13/2024 10:40:50 PM  Radiology No results found.  Procedures Procedures    Medications Ordered in ED Medications  fentaNYL  (SUBLIMAZE ) injection 100 mcg (  has no administration in time range)  ondansetron  (ZOFRAN ) injection 4 mg (4 mg Intravenous Given 02/13/24 2059)  sodium chloride  0.9 % bolus 500 mL (500 mLs Intravenous New Bag/Given 02/13/24 2254)  HYDROmorphone  (DILAUDID ) injection 1 mg (1 mg Intravenous Given 02/13/24 2249)    ED Course/ Medical Decision Making/ A&P Clinical Course as of 02/13/24 2334  Wed Feb 13, 2024  2300 Laboratory workup notable for hyperkalemia of 5.8.  No EKG changes.  Pregnancy negative.  Teresia Fennel DO, am transitioning care of this patient to the oncoming provider pending CT abdomen pelvis, reevaluation after pain meds and disposition [MP]    Clinical Course User Index [MP] Sallyanne Creamer, DO                                 Medical Decision Making 43 year old female with history as above presenting for acute abdominal pain nausea vomiting.  Underwent dialysis as scheduled earlier today.  Is in no acute distress on my assessment.  Afebrile hypertensive.  Dry heaving on my examination.  Diffuse tenderness with limited abdominal exam secondary to distress.  Will obtain laboratory workup, EKG and CT abdomen pelvis.  Will trial opioid analgesic for her pain and reevaluate.  Long QT on EKG.  Will stay away from QT prolonging agents.    Differential diagnosis includes: Acute intra-abdominal infectious/inflammatory process such as appendicitis, diverticulitis, pancreatitis and cholecystitis Urinary tract infection Viral gastroenteritis Pregnancy  Amount and/or Complexity of Data  Reviewed Labs: ordered. Radiology: ordered.  Risk Prescription drug management.           Final Clinical Impression(s) / ED Diagnoses Final diagnoses:  Nausea and vomiting, unspecified vomiting type  ESRD on dialysis [facility])    Rx / DC Orders ED Discharge Orders     None         Sallyanne Creamer, DO 02/13/24 2334

## 2024-02-13 NOTE — ED Notes (Signed)
 Pt continue to scream out loud, tech went in room and asked what was wrong but would not answer would just continue screaming. Pt started spitting on the floor, tech started to hand her an emesis bag, but pt already had one and choose to continue spitting on the floor.

## 2024-02-13 NOTE — ED Provider Notes (Signed)
 Blood pressure (!) 185/83, pulse 81, temperature (!) 96 F (35.6 C), temperature source Rectal, resp. rate (!) 22, height 5' 7.5 (1.715 m), weight 59.3 kg, last menstrual period 02/13/2024, SpO2 96%.  Assuming care from Dr. Ranelle Buys.  In short, [name] [name] is a 43 y.o. female with a chief complaint of Emesis .  Refer to the original H&P for additional details.  The current plan of care is to follow up on CT and reassess after pain meds.  02:30 AM  CT shows colitis pattern with likely pulmonary edema at the base of the lungs.  Patient is hypertensive, possible secondary to pain although resting on my reassessment.  I did reach out to nephrology to coordinate dialysis.  They recommend giving a single dose of Lokelma  and they will put her on the list for dialysis in the hospital.  Plan to admit for pain management, dialysis.   Discussed patient's case with [facility] to request admission. Patient and family (if present) updated with plan.   I reviewed all nursing notes, vitals, pertinent old records, EKGs, labs, imaging (as available).    Roberts Ching, MD 02/15/24 [phone]

## 2024-02-14 ENCOUNTER — Inpatient Hospital Stay (HOSPITAL_COMMUNITY)

## 2024-02-14 DIAGNOSIS — R569 Unspecified convulsions: Secondary | ICD-10-CM | POA: Diagnosis not present

## 2024-02-14 DIAGNOSIS — I161 Hypertensive emergency: Secondary | ICD-10-CM | POA: Diagnosis present

## 2024-02-14 DIAGNOSIS — F1120 Opioid dependence, uncomplicated: Secondary | ICD-10-CM | POA: Diagnosis present

## 2024-02-14 DIAGNOSIS — T8612 Kidney transplant failure: Secondary | ICD-10-CM | POA: Diagnosis present

## 2024-02-14 DIAGNOSIS — R1312 Dysphagia, oropharyngeal phase: Secondary | ICD-10-CM | POA: Diagnosis not present

## 2024-02-14 DIAGNOSIS — I619 Nontraumatic intracerebral hemorrhage, unspecified: Secondary | ICD-10-CM | POA: Diagnosis not present

## 2024-02-14 DIAGNOSIS — R112 Nausea with vomiting, unspecified: Secondary | ICD-10-CM | POA: Diagnosis not present

## 2024-02-14 DIAGNOSIS — E871 Hypo-osmolality and hyponatremia: Secondary | ICD-10-CM | POA: Diagnosis present

## 2024-02-14 DIAGNOSIS — Z515 Encounter for palliative care: Secondary | ICD-10-CM | POA: Diagnosis not present

## 2024-02-14 DIAGNOSIS — A419 Sepsis, unspecified organism: Secondary | ICD-10-CM | POA: Diagnosis present

## 2024-02-14 DIAGNOSIS — K529 Noninfective gastroenteritis and colitis, unspecified: Secondary | ICD-10-CM | POA: Diagnosis not present

## 2024-02-14 DIAGNOSIS — E1043 Type 1 diabetes mellitus with diabetic autonomic (poly)neuropathy: Secondary | ICD-10-CM

## 2024-02-14 DIAGNOSIS — G9340 Encephalopathy, unspecified: Secondary | ICD-10-CM | POA: Diagnosis not present

## 2024-02-14 DIAGNOSIS — Z992 Dependence on renal dialysis: Secondary | ICD-10-CM

## 2024-02-14 DIAGNOSIS — J810 Acute pulmonary edema: Secondary | ICD-10-CM

## 2024-02-14 DIAGNOSIS — Z4659 Encounter for fitting and adjustment of other gastrointestinal appliance and device: Secondary | ICD-10-CM | POA: Diagnosis not present

## 2024-02-14 DIAGNOSIS — D84821 Immunodeficiency due to drugs: Secondary | ICD-10-CM | POA: Diagnosis present

## 2024-02-14 DIAGNOSIS — R509 Fever, unspecified: Secondary | ICD-10-CM | POA: Diagnosis not present

## 2024-02-14 DIAGNOSIS — G911 Obstructive hydrocephalus: Secondary | ICD-10-CM | POA: Diagnosis present

## 2024-02-14 DIAGNOSIS — I609 Nontraumatic subarachnoid hemorrhage, unspecified: Secondary | ICD-10-CM

## 2024-02-14 DIAGNOSIS — J9601 Acute respiratory failure with hypoxia: Secondary | ICD-10-CM

## 2024-02-14 DIAGNOSIS — R079 Chest pain, unspecified: Secondary | ICD-10-CM | POA: Diagnosis not present

## 2024-02-14 DIAGNOSIS — Z8639 Personal history of other endocrine, nutritional and metabolic disease: Secondary | ICD-10-CM | POA: Diagnosis not present

## 2024-02-14 DIAGNOSIS — I6200 Nontraumatic subdural hemorrhage, unspecified: Secondary | ICD-10-CM | POA: Diagnosis not present

## 2024-02-14 DIAGNOSIS — I120 Hypertensive chronic kidney disease with stage 5 chronic kidney disease or end stage renal disease: Secondary | ICD-10-CM | POA: Diagnosis present

## 2024-02-14 DIAGNOSIS — Z9483 Pancreas transplant status: Secondary | ICD-10-CM | POA: Diagnosis not present

## 2024-02-14 DIAGNOSIS — N186 End stage renal disease: Secondary | ICD-10-CM

## 2024-02-14 DIAGNOSIS — I63542 Cerebral infarction due to unspecified occlusion or stenosis of left cerebellar artery: Secondary | ICD-10-CM | POA: Diagnosis not present

## 2024-02-14 DIAGNOSIS — I6389 Other cerebral infarction: Secondary | ICD-10-CM | POA: Diagnosis not present

## 2024-02-14 DIAGNOSIS — R935 Abnormal findings on diagnostic imaging of other abdominal regions, including retroperitoneum: Secondary | ICD-10-CM

## 2024-02-14 DIAGNOSIS — G9389 Other specified disorders of brain: Secondary | ICD-10-CM

## 2024-02-14 DIAGNOSIS — G919 Hydrocephalus, unspecified: Secondary | ICD-10-CM

## 2024-02-14 DIAGNOSIS — I160 Hypertensive urgency: Secondary | ICD-10-CM | POA: Diagnosis not present

## 2024-02-14 DIAGNOSIS — D631 Anemia in chronic kidney disease: Secondary | ICD-10-CM | POA: Diagnosis present

## 2024-02-14 DIAGNOSIS — J90 Pleural effusion, not elsewhere classified: Secondary | ICD-10-CM | POA: Diagnosis present

## 2024-02-14 DIAGNOSIS — I639 Cerebral infarction, unspecified: Secondary | ICD-10-CM | POA: Diagnosis not present

## 2024-02-14 DIAGNOSIS — I629 Nontraumatic intracranial hemorrhage, unspecified: Secondary | ICD-10-CM | POA: Diagnosis not present

## 2024-02-14 DIAGNOSIS — Y830 Surgical operation with transplant of whole organ as the cause of abnormal reaction of the patient, or of later complication, without mention of misadventure at the time of the procedure: Secondary | ICD-10-CM | POA: Diagnosis present

## 2024-02-14 DIAGNOSIS — R29726 NIHSS score 26: Secondary | ICD-10-CM | POA: Diagnosis not present

## 2024-02-14 DIAGNOSIS — I739 Peripheral vascular disease, unspecified: Secondary | ICD-10-CM | POA: Diagnosis not present

## 2024-02-14 DIAGNOSIS — Z7189 Other specified counseling: Secondary | ICD-10-CM | POA: Diagnosis not present

## 2024-02-14 DIAGNOSIS — G935 Compression of brain: Secondary | ICD-10-CM | POA: Diagnosis present

## 2024-02-14 DIAGNOSIS — I608 Other nontraumatic subarachnoid hemorrhage: Secondary | ICD-10-CM | POA: Diagnosis not present

## 2024-02-14 DIAGNOSIS — R110 Nausea: Secondary | ICD-10-CM | POA: Diagnosis not present

## 2024-02-14 DIAGNOSIS — Z940 Kidney transplant status: Secondary | ICD-10-CM

## 2024-02-14 DIAGNOSIS — R001 Bradycardia, unspecified: Secondary | ICD-10-CM | POA: Diagnosis not present

## 2024-02-14 DIAGNOSIS — E1022 Type 1 diabetes mellitus with diabetic chronic kidney disease: Secondary | ICD-10-CM | POA: Diagnosis present

## 2024-02-14 DIAGNOSIS — I613 Nontraumatic intracerebral hemorrhage in brain stem: Secondary | ICD-10-CM | POA: Diagnosis not present

## 2024-02-14 DIAGNOSIS — J690 Pneumonitis due to inhalation of food and vomit: Secondary | ICD-10-CM | POA: Diagnosis present

## 2024-02-14 DIAGNOSIS — R6520 Severe sepsis without septic shock: Secondary | ICD-10-CM

## 2024-02-14 DIAGNOSIS — Z9489 Other transplanted organ and tissue status: Secondary | ICD-10-CM

## 2024-02-14 DIAGNOSIS — G936 Cerebral edema: Secondary | ICD-10-CM | POA: Diagnosis present

## 2024-02-14 DIAGNOSIS — E109 Type 1 diabetes mellitus without complications: Secondary | ICD-10-CM | POA: Diagnosis not present

## 2024-02-14 DIAGNOSIS — G910 Communicating hydrocephalus: Secondary | ICD-10-CM | POA: Diagnosis present

## 2024-02-14 DIAGNOSIS — E118 Type 2 diabetes mellitus with unspecified complications: Secondary | ICD-10-CM | POA: Diagnosis present

## 2024-02-14 DIAGNOSIS — G9341 Metabolic encephalopathy: Secondary | ICD-10-CM | POA: Diagnosis present

## 2024-02-14 DIAGNOSIS — D61818 Other pancytopenia: Secondary | ICD-10-CM

## 2024-02-14 DIAGNOSIS — R9431 Abnormal electrocardiogram [ECG] [EKG]: Secondary | ICD-10-CM | POA: Diagnosis present

## 2024-02-14 DIAGNOSIS — I615 Nontraumatic intracerebral hemorrhage, intraventricular: Secondary | ICD-10-CM | POA: Diagnosis present

## 2024-02-14 DIAGNOSIS — E875 Hyperkalemia: Secondary | ICD-10-CM

## 2024-02-14 DIAGNOSIS — E872 Acidosis, unspecified: Secondary | ICD-10-CM | POA: Diagnosis present

## 2024-02-14 DIAGNOSIS — E440 Moderate protein-calorie malnutrition: Secondary | ICD-10-CM | POA: Diagnosis present

## 2024-02-14 LAB — POCT I-STAT 7, (LYTES, BLD GAS, ICA,H+H)
Acid-Base Excess: 6 mmol/L — ABNORMAL HIGH (ref 0.0–2.0)
Bicarbonate: 30.3 mmol/L — ABNORMAL HIGH (ref 20.0–28.0)
Calcium, Ion: 1.11 mmol/L — ABNORMAL LOW (ref 1.15–1.40)
HCT: 30 % — ABNORMAL LOW (ref 36.0–46.0)
Hemoglobin: 10.2 g/dL — ABNORMAL LOW (ref 12.0–15.0)
O2 Saturation: 100 %
Patient temperature: 37.3
Potassium: 5.5 mmol/L — ABNORMAL HIGH (ref 3.5–5.1)
Sodium: 137 mmol/L (ref 135–145)
TCO2: 32 mmol/L (ref 22–32)
pCO2 arterial: 44.3 mmHg (ref 32–48)
pH, Arterial: 7.444 (ref 7.35–7.45)
pO2, Arterial: 339 mmHg — ABNORMAL HIGH (ref 83–108)

## 2024-02-14 LAB — RENAL FUNCTION PANEL
Albumin: 3.7 g/dL (ref 3.5–5.0)
Anion gap: 17 — ABNORMAL HIGH (ref 5–15)
BUN: 36 mg/dL — ABNORMAL HIGH (ref 6–20)
CO2: 25 mmol/L (ref 22–32)
Calcium: 9.6 mg/dL (ref 8.9–10.3)
Chloride: 97 mmol/L — ABNORMAL LOW (ref 98–111)
Creatinine, Ser: 6.33 mg/dL — ABNORMAL HIGH (ref 0.44–1.00)
GFR, Estimated: 8 mL/min — ABNORMAL LOW (ref >60)
Glucose, Bld: 130 mg/dL — ABNORMAL HIGH (ref 70–99)
Phosphorus: 6.1 mg/dL — ABNORMAL HIGH (ref 2.5–4.6)
Potassium: 6.1 mmol/L — ABNORMAL HIGH (ref 3.5–5.1)
Sodium: 139 mmol/L (ref 135–145)

## 2024-02-14 LAB — CBC
HCT: 37.5 % (ref 36.0–46.0)
HCT: 32.9 % — ABNORMAL LOW (ref 36.0–46.0)
Hemoglobin: 12.2 g/dL (ref 12.0–15.0)
Hemoglobin: 10.5 g/dL — ABNORMAL LOW (ref 12.0–15.0)
MCH: 30.2 pg (ref 26.0–34.0)
MCH: 29.8 pg (ref 26.0–34.0)
MCHC: 32.5 g/dL (ref 30.0–36.0)
MCHC: 31.9 g/dL (ref 30.0–36.0)
MCV: 92.8 fL (ref 80.0–100.0)
MCV: 93.5 fL (ref 80.0–100.0)
Platelets: 109 10*3/uL — ABNORMAL LOW (ref 150–400)
Platelets: 109 10*3/uL — ABNORMAL LOW (ref 150–400)
RBC: 4.04 MIL/uL (ref 3.87–5.11)
RBC: 3.52 MIL/uL — ABNORMAL LOW (ref 3.87–5.11)
RDW: 13 % (ref 11.5–15.5)
RDW: 13.3 % (ref 11.5–15.5)
WBC: 11.2 10*3/uL — ABNORMAL HIGH (ref 4.0–10.5)
WBC: 9.8 10*3/uL (ref 4.0–10.5)
nRBC: 0 % (ref 0.0–0.2)
nRBC: 0 % (ref 0.0–0.2)

## 2024-02-14 LAB — BASIC METABOLIC PANEL WITH GFR
Anion gap: 17 — ABNORMAL HIGH (ref 5–15)
BUN: 39 mg/dL — ABNORMAL HIGH (ref 6–20)
CO2: 26 mmol/L (ref 22–32)
Calcium: 9.2 mg/dL (ref 8.9–10.3)
Chloride: 96 mmol/L — ABNORMAL LOW (ref 98–111)
Creatinine, Ser: 6.89 mg/dL — ABNORMAL HIGH (ref 0.44–1.00)
GFR, Estimated: 7 mL/min — ABNORMAL LOW (ref >60)
Glucose, Bld: 128 mg/dL — ABNORMAL HIGH (ref 70–99)
Potassium: 5.4 mmol/L — ABNORMAL HIGH (ref 3.5–5.1)
Sodium: 139 mmol/L (ref 135–145)

## 2024-02-14 LAB — BLOOD GAS, ARTERIAL
Acid-Base Excess: 5.2 mmol/L — ABNORMAL HIGH (ref 0.0–2.0)
Bicarbonate: 29 mmol/L — ABNORMAL HIGH (ref 20.0–28.0)
Drawn by: 13746
O2 Saturation: 95.3 %
Patient temperature: 37.3
pCO2 arterial: 40 mmHg (ref 32–48)
pH, Arterial: 7.47 — ABNORMAL HIGH (ref 7.35–7.45)
pO2, Arterial: 82 mmHg — ABNORMAL LOW (ref 83–108)

## 2024-02-14 LAB — LACTIC ACID, PLASMA
Lactic Acid, Venous: 1.4 mmol/L (ref 0.5–1.9)
Lactic Acid, Venous: 1.3 mmol/L (ref 0.5–1.9)

## 2024-02-14 LAB — TYPE AND SCREEN
ABO/RH(D): O POS
Antibody Screen: NEGATIVE

## 2024-02-14 LAB — GLUCOSE, CAPILLARY
Glucose-Capillary: 132 mg/dL — ABNORMAL HIGH (ref 70–99)
Glucose-Capillary: 139 mg/dL — ABNORMAL HIGH (ref 70–99)
Glucose-Capillary: 141 mg/dL — ABNORMAL HIGH (ref 70–99)
Glucose-Capillary: 144 mg/dL — ABNORMAL HIGH (ref 70–99)
Glucose-Capillary: 146 mg/dL — ABNORMAL HIGH (ref 70–99)
Glucose-Capillary: 82 mg/dL (ref 70–99)

## 2024-02-14 LAB — HEMOGLOBIN A1C
Hgb A1c MFr Bld: 4.2 % — ABNORMAL LOW (ref 4.8–5.6)
Mean Plasma Glucose: 73.84 mg/dL

## 2024-02-14 LAB — MRSA NEXT GEN BY PCR, NASAL: MRSA by PCR Next Gen: NOT DETECTED

## 2024-02-14 LAB — DIC (DISSEMINATED INTRAVASCULAR COAGULATION)PANEL
D-Dimer, Quant: 1.62 ug{FEU}/mL — ABNORMAL HIGH (ref 0.00–0.50)
Fibrinogen: 314 mg/dL (ref 210–475)
INR: 1.3 — ABNORMAL HIGH (ref 0.8–1.2)
Platelets: 116 10*3/uL — ABNORMAL LOW (ref 150–400)
Prothrombin Time: 16.8 s — ABNORMAL HIGH (ref 11.4–15.2)
Smear Review: NONE SEEN
aPTT: 28 s (ref 24–36)

## 2024-02-14 LAB — BRAIN NATRIURETIC PEPTIDE: B Natriuretic Peptide: 1486.6 pg/mL — ABNORMAL HIGH (ref 0.0–100.0)

## 2024-02-14 LAB — C-REACTIVE PROTEIN: CRP: <0.5 mg/dL (ref <1.0)

## 2024-02-14 LAB — HEPATITIS B SURFACE ANTIGEN: Hepatitis B Surface Ag: NONREACTIVE

## 2024-02-14 MED ORDER — CALCIUM GLUCONATE-NACL 2-0.675 GM/100ML-% IV SOLN
2.0000 g | Freq: Once | INTRAVENOUS | Status: AC
  Administered 2024-02-14: 2000 mg via INTRAVENOUS
  Filled 2024-02-14: qty 100

## 2024-02-14 MED ORDER — PIPERACILLIN-TAZOBACTAM 3.375 G IVPB
3.3750 g | Freq: Four times a day (QID) | INTRAVENOUS | Status: DC
  Administered 2024-02-14 – 2024-02-17 (×12): 3.375 g via INTRAVENOUS
  Filled 2024-02-14 (×12): qty 50

## 2024-02-14 MED ORDER — ROCURONIUM BROMIDE 10 MG/ML (PF) SYRINGE
50.0000 mg | PREFILLED_SYRINGE | INTRAVENOUS | Status: AC
  Administered 2024-02-14: 50 mg via INTRAVENOUS

## 2024-02-14 MED ORDER — DEXTROSE 50 % IV SOLN
1.0000 | Freq: Once | INTRAVENOUS | Status: AC
  Administered 2024-02-14: 50 mL via INTRAVENOUS
  Filled 2024-02-14: qty 50

## 2024-02-14 MED ORDER — DOCUSATE SODIUM 50 MG/5ML PO LIQD
100.0000 mg | Freq: Two times a day (BID) | ORAL | Status: DC
  Administered 2024-02-14 – 2024-02-15 (×3): 100 mg
  Filled 2024-02-14 (×3): qty 10

## 2024-02-14 MED ORDER — POLYETHYLENE GLYCOL 3350 17 G PO PACK
17.0000 g | PACK | Freq: Every day | ORAL | Status: DC
  Administered 2024-02-15: 17 g
  Filled 2024-02-14: qty 1

## 2024-02-14 MED ORDER — DOXERCALCIFEROL 4 MCG/2ML IV SOLN
4.0000 ug | INTRAVENOUS | Status: DC
  Administered 2024-02-15 – 2024-02-23 (×3): 4 ug via INTRAVENOUS
  Filled 2024-02-14 (×2): qty 2

## 2024-02-14 MED ORDER — CHLORHEXIDINE GLUCONATE CLOTH 2 % EX PADS: 6.0000 | MEDICATED_PAD | Freq: Every day | CUTANEOUS | Status: DC

## 2024-02-14 MED ORDER — FENTANYL CITRATE PF 50 MCG/ML IJ SOSY: 50.0000 ug | PREFILLED_SYRINGE | INTRAMUSCULAR | Status: DC

## 2024-02-14 MED ORDER — FENTANYL CITRATE PF 50 MCG/ML IJ SOSY
50.0000 ug | PREFILLED_SYRINGE | INTRAMUSCULAR | Status: AC
  Administered 2024-02-14: 50 ug via INTRAVENOUS

## 2024-02-14 MED ORDER — SODIUM ZIRCONIUM CYCLOSILICATE 10 G PO PACK
10.0000 g | PACK | Freq: Once | ORAL | Status: AC
  Administered 2024-02-14: 10 g via ORAL
  Filled 2024-02-14: qty 1

## 2024-02-14 MED ORDER — LIDOCAINE-PRILOCAINE 2.5-2.5 % EX CREA: 1.0000 | TOPICAL_CREAM | CUTANEOUS | Status: DC | PRN

## 2024-02-14 MED ORDER — HYDRALAZINE HCL 20 MG/ML IJ SOLN
10.0000 mg | INTRAMUSCULAR | Status: DC | PRN
  Administered 2024-02-14 – 2024-02-24 (×11): 10 mg via INTRAVENOUS
  Filled 2024-02-14 (×11): qty 1

## 2024-02-14 MED ORDER — ORAL CARE MOUTH RINSE
15.0000 mL | OROMUCOSAL | Status: DC
  Administered 2024-02-14 – 2024-02-16 (×24): 15 mL via OROMUCOSAL

## 2024-02-14 MED ORDER — LEVETIRACETAM (KEPPRA) 500 MG/5 ML ADULT IV PUSH
500.0000 mg | Freq: Two times a day (BID) | INTRAVENOUS | Status: DC
  Administered 2024-02-15 – 2024-02-18 (×9): 500 mg via INTRAVENOUS
  Filled 2024-02-14 (×10): qty 5

## 2024-02-14 MED ORDER — NEPRO/CARBSTEADY PO LIQD: 237.0000 mL | ORAL | Status: DC | PRN

## 2024-02-14 MED ORDER — HEPARIN SODIUM (PORCINE) 1000 UNIT/ML DIALYSIS
1000.0000 [IU] | INTRAMUSCULAR | Status: DC | PRN
  Administered 2024-02-17: 3000 [IU] via INTRAVENOUS_CENTRAL
  Filled 2024-02-14: qty 4
  Filled 2024-02-14: qty 6

## 2024-02-14 MED ORDER — STROKE: EARLY STAGES OF RECOVERY BOOK
Freq: Once | Status: AC
  Filled 2024-02-14: qty 1

## 2024-02-14 MED ORDER — CLEVIDIPINE BUTYRATE 0.5 MG/ML IV EMUL
0.0000 mg/h | INTRAVENOUS | Status: DC
  Filled 2024-02-14: qty 50

## 2024-02-14 MED ORDER — PRISMASOL BGK 2/3.5 32-2-3.5 MEQ/L EC SOLN: Status: DC

## 2024-02-14 MED ORDER — ALBUTEROL SULFATE (2.5 MG/3ML) 0.083% IN NEBU: 2.5000 mg | INHALATION_SOLUTION | RESPIRATORY_TRACT | Status: DC | PRN

## 2024-02-14 MED ORDER — LABETALOL HCL 5 MG/ML IV SOLN
10.0000 mg | INTRAVENOUS | Status: DC | PRN
  Administered 2024-02-14 – 2024-02-25 (×19): 10 mg via INTRAVENOUS
  Filled 2024-02-14 (×18): qty 4

## 2024-02-14 MED ORDER — PIPERACILLIN-TAZOBACTAM 3.375 G IVPB: 3.3750 g | Freq: Three times a day (TID) | INTRAVENOUS | Status: DC

## 2024-02-14 MED ORDER — FENTANYL CITRATE PF 50 MCG/ML IJ SOSY: 25.0000 ug | PREFILLED_SYRINGE | Freq: Once | INTRAMUSCULAR | Status: DC

## 2024-02-14 MED ORDER — VANCOMYCIN HCL 2000 MG/400ML IV SOLN
2000.0000 mg | Freq: Once | INTRAVENOUS | Status: AC
  Administered 2024-02-14: 2000 mg via INTRAVENOUS
  Filled 2024-02-14: qty 400

## 2024-02-14 MED ORDER — STERILE WATER FOR INJECTION IJ SOLN
INTRAMUSCULAR | Status: AC
  Filled 2024-02-14: qty 10

## 2024-02-14 MED ORDER — SODIUM CHLORIDE 0.9% FLUSH: 3.0000 mL | INTRAVENOUS | Status: DC | PRN

## 2024-02-14 MED ORDER — PROPOFOL 1000 MG/100ML IV EMUL
0.0000 ug/kg/min | INTRAVENOUS | Status: DC
  Administered 2024-02-15: 20 ug/kg/min via INTRAVENOUS
  Filled 2024-02-14: qty 100

## 2024-02-14 MED ORDER — SODIUM CHLORIDE 0.9% FLUSH
3.0000 mL | Freq: Two times a day (BID) | INTRAVENOUS | Status: DC
  Administered 2024-02-14 – 2024-03-31 (×90): 3 mL via INTRAVENOUS

## 2024-02-14 MED ORDER — MIDAZOLAM HCL 2 MG/2ML IJ SOLN: 1.0000 mg | INTRAMUSCULAR | Status: DC | PRN

## 2024-02-14 MED ORDER — INSULIN ASPART 100 UNIT/ML IJ SOLN
0.0000 [IU] | INTRAMUSCULAR | Status: DC
  Administered 2024-02-14: 1 [IU] via SUBCUTANEOUS
  Administered 2024-02-16 (×3): 2 [IU] via SUBCUTANEOUS
  Administered 2024-02-16 – 2024-02-17 (×3): 1 [IU] via SUBCUTANEOUS
  Administered 2024-02-17 (×2): 2 [IU] via SUBCUTANEOUS
  Administered 2024-02-17 (×2): 1 [IU] via SUBCUTANEOUS
  Administered 2024-02-17 – 2024-02-18 (×2): 2 [IU] via SUBCUTANEOUS
  Administered 2024-02-18 (×3): 1 [IU] via SUBCUTANEOUS
  Administered 2024-02-18: 2 [IU] via SUBCUTANEOUS
  Administered 2024-02-19: 1 [IU] via SUBCUTANEOUS
  Administered 2024-02-19: 3 [IU] via SUBCUTANEOUS
  Administered 2024-02-19 – 2024-02-20 (×3): 2 [IU] via SUBCUTANEOUS
  Administered 2024-02-20 (×4): 1 [IU] via SUBCUTANEOUS
  Administered 2024-02-20 – 2024-02-21 (×3): 2 [IU] via SUBCUTANEOUS
  Administered 2024-02-21: 1 [IU] via SUBCUTANEOUS
  Administered 2024-02-21 (×2): 2 [IU] via SUBCUTANEOUS
  Administered 2024-02-21: 1 [IU] via SUBCUTANEOUS
  Administered 2024-02-22 (×2): 2 [IU] via SUBCUTANEOUS
  Administered 2024-02-22 (×2): 1 [IU] via SUBCUTANEOUS
  Administered 2024-02-22: 3 [IU] via SUBCUTANEOUS
  Administered 2024-02-22 – 2024-02-23 (×3): 1 [IU] via SUBCUTANEOUS
  Administered 2024-02-23 (×2): 2 [IU] via SUBCUTANEOUS
  Administered 2024-02-23: 3 [IU] via SUBCUTANEOUS
  Administered 2024-02-24 (×3): 1 [IU] via SUBCUTANEOUS
  Administered 2024-02-24: 2 [IU] via SUBCUTANEOUS
  Administered 2024-02-24 (×2): 1 [IU] via SUBCUTANEOUS
  Administered 2024-02-25: 2 [IU] via SUBCUTANEOUS
  Administered 2024-02-25 (×4): 1 [IU] via SUBCUTANEOUS
  Administered 2024-02-25: 2 [IU] via SUBCUTANEOUS
  Administered 2024-02-26 – 2024-02-28 (×9): 1 [IU] via SUBCUTANEOUS
  Administered 2024-02-29: 2 [IU] via SUBCUTANEOUS
  Administered 2024-02-29 – 2024-03-06 (×11): 1 [IU] via SUBCUTANEOUS
  Administered 2024-03-06: 2 [IU] via SUBCUTANEOUS
  Administered 2024-03-06 – 2024-03-07 (×2): 1 [IU] via SUBCUTANEOUS
  Administered 2024-03-07: 2 [IU] via SUBCUTANEOUS
  Administered 2024-03-07 (×2): 1 [IU] via SUBCUTANEOUS
  Administered 2024-03-07: 2 [IU] via SUBCUTANEOUS
  Administered 2024-03-08 – 2024-03-09 (×6): 1 [IU] via SUBCUTANEOUS
  Administered 2024-03-09 (×2): 2 [IU] via SUBCUTANEOUS
  Administered 2024-03-09 – 2024-03-10 (×3): 1 [IU] via SUBCUTANEOUS
  Administered 2024-03-10: 2 [IU] via SUBCUTANEOUS
  Administered 2024-03-11 – 2024-03-15 (×8): 1 [IU] via SUBCUTANEOUS
  Administered 2024-03-15: 2 [IU] via SUBCUTANEOUS
  Administered 2024-03-16 – 2024-03-23 (×10): 1 [IU] via SUBCUTANEOUS
  Administered 2024-03-24: 2 [IU] via SUBCUTANEOUS
  Administered 2024-03-24 – 2024-03-28 (×13): 1 [IU] via SUBCUTANEOUS
  Administered 2024-03-28 – 2024-03-29 (×2): 2 [IU] via SUBCUTANEOUS
  Administered 2024-03-29: 1 [IU] via SUBCUTANEOUS
  Administered 2024-03-29: 2 [IU] via SUBCUTANEOUS
  Administered 2024-03-29 – 2024-03-30 (×4): 1 [IU] via SUBCUTANEOUS
  Administered 2024-03-30: 2 [IU] via SUBCUTANEOUS
  Administered 2024-03-30: 1 [IU] via SUBCUTANEOUS
  Administered 2024-03-30 – 2024-03-31 (×3): 2 [IU] via SUBCUTANEOUS
  Administered 2024-03-31: 1 [IU] via SUBCUTANEOUS

## 2024-02-14 MED ORDER — SODIUM CHLORIDE 0.9% FLUSH
3.0000 mL | Freq: Two times a day (BID) | INTRAVENOUS | Status: DC
  Administered 2024-02-14: 3 mL via INTRAVENOUS
  Administered 2024-02-14 – 2024-02-16 (×5): 10 mL via INTRAVENOUS
  Administered 2024-02-17: 3 mL via INTRAVENOUS
  Administered 2024-02-17 – 2024-02-19 (×4): 10 mL via INTRAVENOUS
  Administered 2024-02-19: 3 mL via INTRAVENOUS
  Administered 2024-02-20: 5 mL via INTRAVENOUS
  Administered 2024-02-20 – 2024-02-24 (×6): 10 mL via INTRAVENOUS
  Administered 2024-02-25: 3 mL via INTRAVENOUS
  Administered 2024-02-25 – 2024-02-27 (×4): 10 mL via INTRAVENOUS
  Administered 2024-02-28 – 2024-03-03 (×4): 3 mL via INTRAVENOUS
  Administered 2024-03-04 – 2024-03-06 (×4): 10 mL via INTRAVENOUS
  Administered 2024-03-08: 3 mL via INTRAVENOUS

## 2024-02-14 MED ORDER — ORAL CARE MOUTH RINSE: 15.0000 mL | OROMUCOSAL | Status: DC | PRN

## 2024-02-14 MED ORDER — INSULIN ASPART 100 UNIT/ML IV SOLN
5.0000 [IU] | Freq: Once | INTRAVENOUS | Status: AC
  Administered 2024-02-14: 5 [IU] via INTRAVENOUS

## 2024-02-14 MED ORDER — ACETAMINOPHEN 325 MG PO TABS
650.0000 mg | ORAL_TABLET | Freq: Four times a day (QID) | ORAL | Status: DC | PRN
  Administered 2024-02-16: 650 mg via ORAL
  Filled 2024-02-14: qty 2

## 2024-02-14 MED ORDER — METRONIDAZOLE 500 MG/100ML IV SOLN
500.0000 mg | Freq: Two times a day (BID) | INTRAVENOUS | Status: DC
  Administered 2024-02-14: 500 mg via INTRAVENOUS
  Filled 2024-02-14: qty 100

## 2024-02-14 MED ORDER — PENTAFLUOROPROP-TETRAFLUOROETH EX AERO: 1.0000 | INHALATION_SPRAY | CUTANEOUS | Status: DC | PRN

## 2024-02-14 MED ORDER — CHLORHEXIDINE GLUCONATE CLOTH 2 % EX PADS
6.0000 | MEDICATED_PAD | Freq: Every day | CUTANEOUS | Status: DC
  Administered 2024-02-14 – 2024-02-23 (×10): 6 via TOPICAL

## 2024-02-14 MED ORDER — CLEVIDIPINE BUTYRATE 0.5 MG/ML IV EMUL
0.0000 mg/h | INTRAVENOUS | Status: DC
  Administered 2024-02-14: 6 mg/h via INTRAVENOUS
  Administered 2024-02-14: 2 mg/h via INTRAVENOUS
  Administered 2024-02-15: 12 mg/h via INTRAVENOUS
  Administered 2024-02-15: 6 mg/h via INTRAVENOUS
  Administered 2024-02-15: 18 mg/h via INTRAVENOUS
  Administered 2024-02-15: 15 mg/h via INTRAVENOUS
  Administered 2024-02-16: 12 mg/h via INTRAVENOUS
  Administered 2024-02-16: 4 mg/h via INTRAVENOUS
  Filled 2024-02-14: qty 50
  Filled 2024-02-14: qty 100
  Filled 2024-02-14: qty 50
  Filled 2024-02-14 (×2): qty 100
  Filled 2024-02-14: qty 50
  Filled 2024-02-14 (×2): qty 100

## 2024-02-14 MED ORDER — PRISMASOL BGK 4/2.5 32-4-2.5 MEQ/L EC SOLN: Status: DC

## 2024-02-14 MED ORDER — NIMODIPINE 6 MG/ML PO SOLN
60.0000 mg | ORAL | Status: AC
  Administered 2024-02-15 – 2024-03-06 (×126): 60 mg
  Filled 2024-02-14 (×126): qty 10

## 2024-02-14 MED ORDER — NALOXONE HCL 0.4 MG/ML IJ SOLN: 0.2000 mg | Freq: Once | INTRAMUSCULAR | Status: AC

## 2024-02-14 MED ORDER — PROPOFOL 1000 MG/100ML IV EMUL
INTRAVENOUS | Status: AC
  Administered 2024-02-14: 10 ug/kg/min via INTRAVENOUS
  Filled 2024-02-14: qty 100

## 2024-02-14 MED ORDER — FENTANYL BOLUS VIA INFUSION
25.0000 ug | INTRAVENOUS | Status: DC | PRN
  Administered 2024-02-14: 25 ug via INTRAVENOUS
  Administered 2024-02-14 (×3): 100 ug via INTRAVENOUS
  Administered 2024-02-14 – 2024-02-15 (×2): 50 ug via INTRAVENOUS

## 2024-02-14 MED ORDER — TRIMETHOBENZAMIDE HCL 100 MG/ML IM SOLN
200.0000 mg | Freq: Four times a day (QID) | INTRAMUSCULAR | Status: DC | PRN
  Administered 2024-02-14 – 2024-03-04 (×7): 200 mg via INTRAMUSCULAR
  Filled 2024-02-14 (×10): qty 2

## 2024-02-14 MED ORDER — PANTOPRAZOLE SODIUM 40 MG IV SOLR
40.0000 mg | Freq: Every day | INTRAVENOUS | Status: DC
  Administered 2024-02-14: 40 mg via INTRAVENOUS
  Filled 2024-02-14: qty 10

## 2024-02-14 MED ORDER — HYDROMORPHONE HCL 1 MG/ML IJ SOLN
0.5000 mg | INTRAMUSCULAR | Status: DC | PRN
  Administered 2024-02-14: 1 mg via INTRAVENOUS
  Filled 2024-02-14: qty 1

## 2024-02-14 MED ORDER — SENNOSIDES-DOCUSATE SODIUM 8.6-50 MG PO TABS: 1.0000 | ORAL_TABLET | Freq: Two times a day (BID) | ORAL | Status: DC

## 2024-02-14 MED ORDER — ETOMIDATE 2 MG/ML IV SOLN
20.0000 mg | INTRAVENOUS | Status: AC
  Administered 2024-02-14: 20 mg via INTRAVENOUS

## 2024-02-14 MED ORDER — PIPERACILLIN-TAZOBACTAM IN DEX 2-0.25 GM/50ML IV SOLN
2.2500 g | Freq: Three times a day (TID) | INTRAVENOUS | Status: DC
  Filled 2024-02-14 (×2): qty 50

## 2024-02-14 MED ORDER — FENTANYL 2500MCG IN NS 250ML (10MCG/ML) PREMIX INFUSION
0.0000 ug/h | INTRAVENOUS | Status: DC
  Administered 2024-02-15 (×2): 150 ug/h via INTRAVENOUS
  Filled 2024-02-14 (×2): qty 250

## 2024-02-14 MED ORDER — SENNOSIDES-DOCUSATE SODIUM 8.6-50 MG PO TABS
1.0000 | ORAL_TABLET | Freq: Two times a day (BID) | ORAL | Status: DC
  Administered 2024-02-14 – 2024-02-15 (×3): 1
  Filled 2024-02-14 (×3): qty 1

## 2024-02-14 MED ORDER — LIDOCAINE HCL (PF) 1 % IJ SOLN
5.0000 mL | INTRAMUSCULAR | Status: DC | PRN
  Administered 2024-02-14: 5 mL via INTRADERMAL
  Filled 2024-02-14: qty 5

## 2024-02-14 MED ORDER — MIDAZOLAM HCL 2 MG/2ML IJ SOLN
2.0000 mg | INTRAMUSCULAR | Status: AC
  Administered 2024-02-14: 2 mg via INTRAVENOUS

## 2024-02-14 MED ORDER — ACETAMINOPHEN 650 MG RE SUPP: 650.0000 mg | Freq: Four times a day (QID) | RECTAL | Status: DC | PRN

## 2024-02-14 MED ORDER — IOHEXOL 350 MG/ML SOLN
75.0000 mL | Freq: Once | INTRAVENOUS | Status: AC | PRN
  Administered 2024-02-14: 75 mL via INTRAVENOUS

## 2024-02-14 MED ORDER — NALOXONE HCL 0.4 MG/ML IJ SOLN
INTRAMUSCULAR | Status: AC
  Administered 2024-02-14: 0.2 mg via INTRAVENOUS
  Filled 2024-02-14: qty 1

## 2024-02-14 MED ORDER — SODIUM CHLORIDE 0.9 % IV SOLN
2.0000 g | INTRAVENOUS | Status: DC
  Administered 2024-02-14: 2 g via INTRAVENOUS
  Filled 2024-02-14: qty 20

## 2024-02-14 MED ORDER — FENTANYL 2500MCG IN NS 250ML (10MCG/ML) PREMIX INFUSION
INTRAVENOUS | Status: AC
  Administered 2024-02-14: 25 ug/h via INTRAVENOUS
  Filled 2024-02-14: qty 250

## 2024-02-14 NOTE — ED Notes (Signed)
 Bair hugger placed on pt.

## 2024-02-14 NOTE — Consult Note (Signed)
 Reason for Consult: Intracranial hemorrhage, hydrocephalus Referring Physician: Dr. Lindzen Kayle [name] [name] is an 43 y.o. female.  HPI: The patient is an unhealthy 43 year old female immigrant from [place] with type 1 diabetes mellitus, renal failure, status post pancreas and renal transplant, chronic hemodialysis, etc.  He had a decreased mental status and was worked up with a head CT which demonstrated other intracranial hemorrhage and hydrocephalus.  Dr. Lindzen called me and I came to evaluate the patient.  There are no family members available and the patient cannot present a history.  Not anticoagulated.  She is reportedly right-handed.  Past Medical History:  Diagnosis Date   Anemia    Anxiety    DM (diabetes mellitus), type 1 (HCC)    ESRD on hemodialysis (HCC)    Gastroparesis    Hypertension    Narcotic abuse (HCC)    Non compliance with medical treatment    Vision loss     Past Surgical History:  Procedure Laterality Date   A/V FISTULAGRAM Right 02/17/2021   Procedure: A/V FISTULAGRAM;  Surgeon: Young Hensen, MD;  [facility];  Service: Cardiovascular;  Laterality: Right;   AV FISTULA PLACEMENT  11/17/2011   Procedure: ARTERIOVENOUS (AV) FISTULA CREATION;  Surgeon: Mayo Speck, MD;  Location: [facility] OR;  Service: Vascular;  Laterality: Right;   BIOPSY  12/03/2021   Procedure: BIOPSY;  Surgeon: Albertina Hugger, MD;  Location: [facility] ENDOSCOPY;  Service: Gastroenterology;;   ESOPHAGOGASTRODUODENOSCOPY (EGD) WITH PROPOFOL  N/A 12/03/2021   Procedure: ESOPHAGOGASTRODUODENOSCOPY (EGD) WITH PROPOFOL ;  Surgeon: Albertina Hugger, MD;  Location: [facility] ENDOSCOPY;  Service: Gastroenterology;  Laterality: N/A;   EYE SURGERY     HEMATOMA EVACUATION Right 02/25/2021   Procedure: EVACUATION HEMATOMA RIGHT CHEST;  Surgeon: Adine Hoof, MD;  Location: [facility] OR;  Service: Vascular;  Laterality: Right;   INSERTION OF DIALYSIS CATHETER Right 12/08/2020    Procedure: INSERTION OF TUNNELED DIALYSIS CATHETER;  Surgeon: Young Hensen, MD;  Location: [facility] OR;  Service: Vascular;  Laterality: Right;   IR REMOVAL TUN CV CATH W/O FL  05/03/2021   KIDNEY TRANSPLANT     NEPHRECTOMY TRANSPLANTED ORGAN     pancrease transplant     PERIPHERAL VASCULAR BALLOON ANGIOPLASTY Right 02/17/2021   Procedure: PERIPHERAL VASCULAR BALLOON ANGIOPLASTY;  Surgeon: Young Hensen, MD;  [facility]: [facility];  Service: Cardiovascular;  Laterality: Right;   REFRACTIVE SURGERY     REVISON OF ARTERIOVENOUS FISTULA Right 12/08/2020   Procedure: RIGHT ARM ARTERIOVENOUS FISTULA REVISION AND RESECTION;  Surgeon: Young Hensen, MD;  Location: [facility] OR;  Service: Vascular;  Laterality: Right;   REVISON OF ARTERIOVENOUS FISTULA Right 02/25/2021   Procedure: RIGHT ARM ARTERIOVENOUS FISTULA REVISION WITH TRANSPOSITION OF CEPHALIC VEIN ON AXILLARY VEIN;  Surgeon: Young Hensen, MD;  Location: [facility] OR;  Service: Vascular;  Laterality: Right;    Family History  Problem Relation Age of Onset   Hypertension Father     Social History:  reports that she has quit smoking. Her smoking use included pipe. She has never used smokeless tobacco. She reports that she does not drink alcohol  and does not use drugs.  Allergies: No Known Allergies  Medications: I have reviewed the patient's current medications. Prior to Admission:  Medications Prior to Admission  Medication Sig Dispense Refill Last Dose/Taking   amLODipine  (NORVASC ) 10 MG tablet Take 1 tablet (10 mg total) by mouth daily. 30 tablet 2    aspirin  EC  81 MG tablet Chew 81 mg by mouth daily.      busPIRone  (BUSPAR ) 10 MG tablet Take 10 mg by mouth 2 (two) times daily.      carvedilol  (COREG ) 12.5 MG tablet Take 1 tablet (12.5 mg total) by mouth 2 (two) times daily with a meal. (Patient not taking: Reported on 11/06/2023) 60 tablet 2    cinacalcet  (SENSIPAR ) 30 MG tablet Take 3 tablets (90 mg total) by mouth daily  with supper. 60 tablet 1    cloNIDine  (CATAPRES  - DOSED IN MG/24 HR) 0.2 mg/24hr patch 0.2 mg once a week.      cycloSPORINE  modified (NEORAL ) 100 MG capsule Take 2 capsules (200 mg total) by mouth 2 (two) times daily.      cycloSPORINE  modified (NEORAL ) 25 MG capsule Take 25 mg by mouth daily.      Darbepoetin Alfa  (ARANESP ) 60 MCG/0.3ML SOSY injection Inject 0.3 mLs (60 mcg total) into the vein every Friday with hemodialysis. 4.2 mL     doxercalciferol  (HECTOROL ) 4 MCG/2ML injection Inject 3 mLs (6 mcg total) into the vein every Monday, Wednesday, and Friday with hemodialysis. 1 mL     DULoxetine  (CYMBALTA ) 20 MG capsule Take 1 capsule (20 mg total) by mouth daily. 30 capsule 2    ferric citrate  (AURYXIA ) 1 GM 210 MG(Fe) tablet Take 210-630 mg by mouth See admin instructions. Take 3 tablets (630 mg) by mouth three times daily with meals, and take 3 tablets (630 mg) with snacks      gabapentin  (NEURONTIN ) 100 MG capsule Take 100 mg by mouth 2 (two) times daily.      hydrALAZINE  (APRESOLINE ) 25 MG tablet Take 3 tablets (75 mg total) by mouth every 8 (eight) hours. 270 tablet 2    hydrOXYzine  (VISTARIL ) 25 MG capsule Take 25 mg by mouth 3 (three) times daily as needed for anxiety.      lidocaine -prilocaine  (EMLA ) cream Apply 1 application  topically daily as needed (prior to port access).      losartan  (COZAAR ) 100 MG tablet Take 1 tablet (100 mg total) by mouth daily. 30 tablet 2    MELATONIN PO Take 1 tablet by mouth at bedtime.      metoCLOPramide  (REGLAN ) 5 MG tablet Take 5 mg by mouth every 6 (six) hours as needed for nausea or vomiting.      mycophenolate  (MYFORTIC ) 180 MG EC tablet Take 540 mg by mouth 2 (two) times daily.      ondansetron  (ZOFRAN ) 4 MG tablet Take 4 mg by mouth every 8 (eight) hours as needed for nausea or vomiting.      oxyCODONE -acetaminophen  (PERCOCET/ROXICET) 5-325 MG tablet Take 1 tablet by mouth 3 (three) times daily.      predniSONE  (DELTASONE ) 5 MG tablet Take 15 mg  by mouth daily.  5    sodium zirconium cyclosilicate  (LOKELMA ) 10 g PACK packet Take 10 g by mouth daily.      XPHOZAH 30 MG TABS Take 1 tablet by mouth 2 (two) times daily.      zolpidem  (AMBIEN ) 5 MG tablet Take 5 mg by mouth at bedtime as needed for sleep.      Scheduled:  [START ON 02/15/2024]  stroke: early stages of recovery book   Does not apply Once   [START ON 02/15/2024] Chlorhexidine  Gluconate Cloth  6 each Topical Q0600   insulin  aspart  5 Units Intravenous Once   And   dextrose   1 ampule Intravenous Once   docusate  100 mg Per Tube BID   doxercalciferol   4 mcg Intravenous Q M,W,F-HD   fentaNYL  (SUBLIMAZE ) injection  25-50 mcg Intravenous Once   insulin  aspart  0-9 Units Subcutaneous Q4H   mouth rinse  15 mL Mouth Rinse Q2H   pantoprazole  (PROTONIX ) IV  40 mg Intravenous QHS   polyethylene glycol  17 g Per Tube Daily   senna-docusate  1 tablet Per Tube BID   sodium chloride  flush  3 mL Intravenous Q12H   sodium chloride  flush  3-10 mL Intravenous Q12H   Continuous:  calcium  gluconate     clevidipine 8 mg/hr (02/14/24 1435)   fentaNYL  infusion INTRAVENOUS 25 mcg/hr (02/14/24 1505)   piperacillin -tazobactam (ZOSYN )  IV     PrismaSol BGK 2/3.5     PrismaSol BGK 2/3.5     prismasol BGK 4/2.5     PRN:acetaminophen  **OR** acetaminophen , albuterol , feeding supplement (NEPRO CARB STEADY), fentaNYL , heparin , hydrALAZINE , labetalol , lidocaine  (PF), lidocaine -prilocaine , midazolam , mouth rinse, pentafluoroprop-tetrafluoroeth, sodium chloride  flush, trimethobenzamide Anti-infectives (From admission, onward)    Start     Dose/Rate Route Frequency Ordered Stop   02/14/24 1545  piperacillin -tazobactam (ZOSYN ) IVPB 3.375 g  Status:  Discontinued        3.375 g 12.5 mL/hr over 240 Minutes Intravenous Every 8 hours 02/14/24 1451 02/14/24 1453   02/14/24 1545  piperacillin -tazobactam (ZOSYN ) IVPB 2.25 g        2.25 g 100 mL/hr over 30 Minutes Intravenous Every 8 hours 02/14/24 1453      02/14/24 0800  cefTRIAXone  (ROCEPHIN ) 2 g in sodium chloride  0.9 % 100 mL IVPB  Status:  Discontinued        2 g 200 mL/hr over 30 Minutes Intravenous Every 24 hours 02/14/24 0730 02/14/24 1451   02/14/24 0800  metroNIDAZOLE  (FLAGYL ) IVPB 500 mg  Status:  Discontinued        500 mg 100 mL/hr over 60 Minutes Intravenous Every 12 hours 02/14/24 0730 02/14/24 1451        Results for orders placed or performed during the hospital encounter of 02/13/24 (from the past 48 hours)  Lipase, blood     Status: None   Collection Time: 02/13/24  9:09 PM  Result Value Ref Range   Lipase 35 11 - 51 U/L    Comment: Performed at [facility], [address], [place], [name] [address]  Comprehensive metabolic panel     Status: Abnormal   Collection Time: 02/13/24  9:09 PM  Result Value Ref Range   Sodium 136 135 - 145 mmol/L   Potassium 5.8 (H) 3.5 - 5.1 mmol/L   Chloride 93 (L) 98 - 111 mmol/L   CO2 24 22 - 32 mmol/L   Glucose, Bld 122 (H) 70 - 99 mg/dL    Comment: Glucose reference range applies only to samples taken after fasting for at least 8 hours.   BUN 28 (H) 6 - 20 mg/dL   Creatinine, Ser 7.82 (H) 0.44 - 1.00 mg/dL   Calcium  9.6 8.9 - 10.3 mg/dL   Total Protein 7.6 6.5 - 8.1 g/dL   Albumin 3.9 3.5 - 5.0 g/dL   AST 16 15 - 41 U/L   ALT 11 0 - 44 U/L   Alkaline Phosphatase 92 38 - 126 U/L   Total Bilirubin 0.8 0.0 - 1.2 mg/dL   GFR, Estimated 10 (L) >60 mL/min    Comment: (NOTE) Calculated using the CKD-EPI Creatinine Equation (2021)    Anion gap 19 (H) 5 -  15    Comment: Performed at [facility], [address] [place], [name] [address]  CBC     Status: Abnormal   Collection Time: 02/13/24  9:09 PM  Result Value Ref Range   WBC 4.4 4.0 - 10.5 K/uL   RBC 3.94 3.87 - 5.11 MIL/uL   Hemoglobin 11.9 (L) 12.0 - 15.0 g/dL   HCT 60.4 54.0 - 98.1 %   MCV 94.7 80.0 - 100.0 fL   MCH 30.2 26.0 - 34.0 pg   MCHC 31.9 30.0 - 36.0 g/dL   RDW 19.1 47.8 - 29.5 %   Platelets  91 (L) 150 - 400 K/uL    Comment: Immature Platelet Fraction may be clinically indicated, consider ordering this additional test [id number] REPEATED TO VERIFY PLATELET COUNT CONFIRMED BY SMEAR    nRBC 0.0 0.0 - 0.2 %    Comment: Performed at [facility], [address] [place], [name] [address]  hCG, serum, qualitative     Status: None   Collection Time: 02/13/24  9:09 PM  Result Value Ref Range   Preg, Serum NEGATIVE NEGATIVE    Comment:        THE SENSITIVITY OF THIS METHODOLOGY IS >10 mIU/mL. Performed at [facility], [address], [place], [name] [address]   Glucose, capillary     Status: Abnormal   Collection Time: 02/14/24  9:11 AM  Result Value Ref Range   Glucose-Capillary 141 (H) 70 - 99 mg/dL    Comment: Glucose reference range applies only to samples taken after fasting for at least 8 hours.  C-reactive protein     Status: None   Collection Time: 02/14/24  9:37 AM  Result Value Ref Range   CRP <0.5 <1.0 mg/dL    Comment: Performed at [facility], [address] [place], [name] [address]  Culture, blood (Routine X 2) w Reflex to ID Panel     Status: None (Preliminary result)   Collection Time: 02/14/24  9:37 AM   Specimen: BLOOD  Result Value Ref Range   Specimen Description BLOOD LEFT ANTECUBITAL    Special Requests      BOTTLES DRAWN AEROBIC AND ANAEROBIC Blood Culture results may not be optimal due to an inadequate volume of blood received in culture bottles Performed at [facility], [address], [place], [name] [address]    Culture PENDING    Report Status PENDING   Brain natriuretic peptide     Status: Abnormal   Collection Time: 02/14/24  9:49 AM  Result Value Ref Range   B Natriuretic Peptide 1,486.6 (H) 0.0 - 100.0 pg/mL    Comment: Performed at [facility], [address] [place], [name] [address]  Lactic acid, plasma     Status: None   Collection Time: 02/14/24  9:49 AM  Result Value Ref Range   Lactic  Acid, Venous 1.4 0.5 - 1.9 mmol/L    Comment: Performed at [facility], [address] [place], [name] [address]  Renal function panel     Status: Abnormal   Collection Time: 02/14/24  9:49 AM  Result Value Ref Range   Sodium 139 135 - 145 mmol/L   Potassium 6.1 (H) 3.5 - 5.1 mmol/L   Chloride 97 (L) 98 - 111 mmol/L   CO2 25 22 - 32 mmol/L   Glucose, Bld 130 (H) 70 - 99 mg/dL    Comment: Glucose reference range applies  only to samples taken after fasting for at least 8 hours.   BUN 36 (H) 6 - 20 mg/dL   Creatinine, Ser 0.98 (H) 0.44 - 1.00 mg/dL   Calcium  9.6 8.9 - 10.3 mg/dL   Phosphorus 6.1 (H) 2.5 - 4.6 mg/dL   Albumin 3.7 3.5 - 5.0 g/dL   GFR, Estimated 8 (L) >60 mL/min    Comment: (NOTE) Calculated using the CKD-EPI Creatinine Equation (2021)    Anion gap 17 (H) 5 - 15    Comment: Performed at [facility], [address], [place], [name] [address]  Hemoglobin A1c     Status: Abnormal   Collection Time: 02/14/24  9:49 AM  Result Value Ref Range   Hgb A1c MFr Bld 4.2 (L) 4.8 - 5.6 %    Comment: (NOTE) Diagnosis of Diabetes The following HbA1c ranges recommended by the American Diabetes Association (ADA) may be used as an aid in the diagnosis of diabetes mellitus.  Hemoglobin             Suggested A1C NGSP%              Diagnosis  <5.7                   Non Diabetic  5.7-6.4                Pre-Diabetic  >6.4                   Diabetic  <7.0                   Glycemic control for                       adults with diabetes.     Mean Plasma Glucose 73.84 mg/dL    Comment: Performed at [facility], [address] [place], [name] [address]  Glucose, capillary     Status: Abnormal   Collection Time: 02/14/24 10:11 AM  Result Value Ref Range   Glucose-Capillary 144 (H) 70 - 99 mg/dL    Comment: Glucose reference range applies only to samples taken after fasting for at least 8 hours.  Blood gas, arterial     Status: Abnormal   Collection Time:  02/14/24 10:13 AM  Result Value Ref Range   pH, Arterial 7.47 (H) 7.35 - 7.45   pCO2 arterial 40 32 - 48 mmHg   pO2, Arterial 82 (L) 83 - 108 mmHg   Bicarbonate 29.0 (H) 20.0 - 28.0 mmol/L   Acid-Base Excess 5.2 (H) 0.0 - 2.0 mmol/L   O2 Saturation 95.3 %   Patient temperature 37.3    Collection site LEFT RADIAL    Drawn by [id number]    Allens test (pass/fail) PASS PASS    Comment: Performed at [facility], [address], [place], [name] [address]  Lactic acid, plasma     Status: None   Collection Time: 02/14/24 12:43 PM  Result Value Ref Range   Lactic Acid, Venous 1.3 0.5 - 1.9 mmol/L    Comment: Performed at [facility], [address], [place], [name] [address]  Hepatitis B surface antigen     Status: None   Collection Time: 02/14/24 12:43 PM  Result Value Ref Range   Hepatitis B Surface Ag NON REACTIVE NON REACTIVE    Comment: Performed at [facility], [address], [place], [name] [address]  CBC     Status: Abnormal   Collection Time: 02/14/24 12:43 PM  Result Value Ref Range   WBC 11.2 (H) 4.0 - 10.5 K/uL   RBC 4.04 3.87 - 5.11 MIL/uL   Hemoglobin 12.2 12.0 - 15.0 g/dL   HCT 40.9 81.1 - 91.4 %   MCV 92.8 80.0 - 100.0 fL   MCH 30.2 26.0 - 34.0 pg   MCHC 32.5 30.0 - 36.0 g/dL   RDW 78.2 95.6 - 21.3 %   Platelets 109 (L) 150 - 400 K/uL   nRBC 0.0 0.0 - 0.2 %    Comment: Performed at [facility], [address], [place], [name] [address]  Glucose, capillary     Status: Abnormal   Collection Time: 02/14/24  1:46 PM  Result Value Ref Range   Glucose-Capillary 139 (H) 70 - 99 mg/dL    Comment: Glucose reference range applies only to samples taken after fasting for at least 8 hours.  Glucose, capillary     Status: Abnormal   Collection Time: 02/14/24  3:54 PM  Result Value Ref Range   Glucose-Capillary 132 (H) 70 - 99 mg/dL    Comment: Glucose reference range applies only to samples taken after fasting for at least 8 hours.    CT ANGIO  HEAD NECK W WO CM (CODE STROKE) Result Date: 02/14/2024 CLINICAL DATA:  Neuro deficit, concern for stroke, aphasia, arm tremor, mental status change. EXAM: CT ANGIOGRAPHY HEAD AND NECK WITH AND WITHOUT CONTRAST TECHNIQUE: Multidetector CT imaging of the head and neck was performed using the standard protocol during bolus administration of intravenous contrast. Multiplanar CT image reconstructions and MIPs were obtained to evaluate the vascular anatomy. Carotid stenosis measurements (when applicable) are obtained utilizing NASCET criteria, using the distal internal carotid diameter as the denominator. RADIATION DOSE REDUCTION: This exam was performed according to the departmental dose-optimization program which includes automated exposure control, adjustment of the mA and/or kV according to patient size and/or use of iterative reconstruction technique. CONTRAST:  75mL OMNIPAQUE  IOHEXOL  350 MG/ML SOLN COMPARISON:  Same day CT head. FINDINGS: CTA NECK FINDINGS Aortic arch: Standard configuration of the aortic arch. Imaged portion shows no evidence of aneurysm or dissection. No significant stenosis of the major arch vessel origins. Pulmonary arteries: As permitted by contrast timing, there are no filling defects in the visualized pulmonary arteries. Subclavian arteries: The subclavian arteries are patent bilaterally. Right carotid system: No evidence of dissection, stenosis (50% or greater), or occlusion. Mild atherosclerosis along the proximal cervical ICA without significant stenosis. Left carotid system: No evidence of dissection, stenosis (50% or greater), or occlusion. Mild atherosclerosis at the carotid bifurcation without significant stenosis. Vertebral arteries: Codominant. No evidence of dissection, stenosis (50% or greater), or occlusion. Atherosclerosis involving the bilateral V4 segments resulting in mild stenosis. Skeleton: No acute or aggressive finding noted. Other neck: The visualized airway is patent.  No cervical lymphadenopathy. Upper chest: Patchy and consolidative opacities in the bilateral upper lobes particularly within the dependent aspect as well as within the superior segment of both lower lobes. Review of the MIP images confirms the above findings CTA HEAD FINDINGS ANTERIOR CIRCULATION: The intracranial ICAs are patent bilaterally. Diffuse atherosclerosis of the bilateral carotid siphons with mild stenosis in the right supraclinoid ICA. No significant stenosis, proximal occlusion, aneurysm, or vascular malformation. MCAs: The middle cerebral arteries are patent bilaterally. ACAs: The anterior cerebral arteries are patent bilaterally. Focal atherosclerosis along the right A3 segment. POSTERIOR CIRCULATION: No significant stenosis, proximal  occlusion, aneurysm, or vascular malformation. PCAs: The posterior cerebral arteries are patent bilaterally. Pcomm: Not well visualized. SCAs: The superior cerebellar arteries are patent bilaterally. Basilar artery: The basilar artery is patent. There is subtle irregularity of the vessel without significant narrowing. AICAs: Not well visualized. PICAs: Patent Vertebral arteries: As above. Venous sinuses: As permitted by contrast timing, patent. Anatomic variants: None Review of the MIP images confirms the above findings IMPRESSION: No large vessel occlusion. No high-grade stenosis, aneurysm, or dissection of the arteries in the head and neck. Multifocal atherosclerosis as above. Mild stenosis of the V4 segments of both vertebral arteries. Mild stenosis of the right supraclinoid ICA. Mild irregularity of the basilar artery without significant narrowing. Finding concerning for mild vasospasm. Consolidative and patchy opacities in the bilateral upper lobes and superior segments of the bilateral lower lobes. Recommend correlation with dedicated chest imaging. Impression points #1-3 were called by telephone at the time of interpretation on 02/14/2024 at 2:26 pm to provider  Dr. Renaee Caro, who verbally acknowledged these results. Electronically Signed   By: Denny Flack M.D.   On: 02/14/2024 14:43   CT HEAD CODE STROKE WO CONTRAST Result Date: 02/14/2024 CLINICAL DATA:  Code stroke. Neuro deficit, concern for stroke, altered mental status and aphasia. EXAM: CT HEAD WITHOUT CONTRAST TECHNIQUE: Contiguous axial images were obtained from the base of the skull through the vertex without intravenous contrast. RADIATION DOSE REDUCTION: This exam was performed according to the departmental dose-optimization program which includes automated exposure control, adjustment of the mA and/or kV according to patient size and/or use of iterative reconstruction technique. COMPARISON:  CT head 11/05/2023. FINDINGS: Brain: Large focus of extra-axial hemorrhage filling the prepontine cistern extending inferiorly to the foramen magnum and into the cervical spinal canal. Confluent region of hemorrhage in the prepontine cistern measures 4.1 x 2.0 cm in axial dimensions with craniocaudal extent of at least 5.2 cm to the level of the foramen magnum. There is mass effect on the brainstem. Additional subdural hemorrhage along the posterior falx and tentorial leaflets. Scattered areas of subarachnoid hemorrhage particularly along the left sylvian fissure. There are intraventricular blood products layering in both occipital horns. Encephalomalacia in the inferior medial left cerebellum concerning for remote infarct. No midline shift. Ventricles: Prominence of the ventricles concerning for developing communicating hydrocephalus. Intraventricular blood products as above. Vascular: Atherosclerotic calcifications of the carotid siphons and intracranial vertebral arteries. No hyperdense vessel. Skull: No acute or aggressive finding. Orbits: Orbits are symmetric. Sinuses: The visualized paranasal sinuses are clear. Other: Mastoid air cells are clear. ASPECTS [facility] Stroke Program Early CT Score) - Ganglionic level  infarction (caudate, lentiform nuclei, internal capsule, insula, M1-M3 cortex): 7 - Supraganglionic infarction (M4-M6 cortex): 3 Total score (0-10 with 10 being normal): 10 IMPRESSION: 1. Large multi compartment intracranial hemorrhage as described above. Dominant focus of hemorrhage involving the prepontine cistern extending into the upper cervical spinal canal. 2. Intraventricular blood products layering in the occipital horns. 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 3. No midline shift. 4. ASPECTS is 10 These results were communicated to Dr. Lindzen at 2:13 pm on 02/14/2024 by text page via the [facility] messaging system. Electronically Signed   By: Denny Flack M.D.   On: 02/14/2024 14:13   CT ABDOMEN PELVIS WO CONTRAST Result Date: 02/14/2024 CLINICAL DATA:  Abdominal pain, nausea, and vomiting after dialysis. EXAM: CT ABDOMEN AND PELVIS WITHOUT CONTRAST TECHNIQUE: Multidetector CT imaging of the abdomen and pelvis was performed following the standard protocol without  IV contrast. RADIATION DOSE REDUCTION: This exam was performed according to the departmental dose-optimization program which includes automated exposure control, adjustment of the mA and/or kV according to patient size and/or use of iterative reconstruction technique. COMPARISON:  CTs without contrast 11/05/2023 and 09/26/2023. FINDINGS: Lower chest: There are new small layering pleural effusions, interval developed interlobular septal edema, and increasing patchy haziness throughout the lung bases consistent with ground-glass edema or pneumonitis. Stable cardiomegaly with small but circumferential pericardial effusion. There is multivessel coronary atherosclerosis. Small hiatal hernia. Hepatobiliary: There is loss of fine detail due to respiratory motion and due to streak artifact from overlying wires. No liver mass is seen without contrast. The liver is enlarged 22.5 cm length. There are few tiny  stones in the posterior gallbladder without wall thickening or bile duct dilatation. Pancreas: No abnormality is seen through the motion and streak artifacts. 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 No inflammatory changes seen or ductal dilatation. Spleen: No focal abnormality is seen through the motion and streak artifacts. The spleen is enlarged measuring 15 cm in length, as before. Adrenals/Urinary Tract: No adrenal mass. Severely atrophic native kidneys with heavy renovascular calcifications. 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 There are small stable cortical cysts. The bladder is contracted and unchanged in appearance. Stomach/Bowel: Unremarkable contracted stomach. Normal caliber in the small bowel except for mild chronic dilatation at the pancreaticojejunostomy site anterior mid abdomen. There is an increasingly thickened appearance in the ascending, descending and sigmoid colon concerning for colitis. There are stranding changes over portions of this. No pneumatosis is seen. The appendix is normal caliber. Vascular/Lymphatic: No enlarged lymph nodes are seen. There are extensive heavy aortic and branch vessel atherosclerotic calcific plaques. No AAA. Reproductive: 4 cm partially calcified dorsal uterine fibroid to the right. Uterus and adnexal areas are otherwise unremarkable except for extensive vascular calcifications in the uterine wall. Other: No free fluid or free air. No abscess or localizing collection. Musculoskeletal: Hemangioma T9 vertebral body. Renal osteodystrophy. No acute or other significant osseous process. IMPRESSION: 1. Increasingly thickened appearance in the ascending, descending and sigmoid colon concerning for colitis. No pneumatosis is seen. 2. New small layering pleural effusions, interlobular  septal lung base edema, and increasing patchy haziness throughout the lung bases consistent with ground-glass edema or pneumonitis. 3. Stable cardiomegaly with small but circumferential pericardial effusion. 4. Cholelithiasis. 5. Hepatosplenomegaly. 6. 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 7. Extensive heavy aortic and branch vessel atherosclerosis. 8. 4 cm partially calcified dorsal uterine fibroid to the right. Aortic Atherosclerosis (ICD10-I70.0). Electronically Signed   By: Denman Fischer M.D.   On: 02/14/2024 00:20    ROS: Unobtainable Blood pressure (!) 131/58, pulse (!) 108, temperature 99.2 F (37.3 C), temperature source Axillary, resp. rate 18, height 5' 7.5 (1.715 m), weight 59.3 kg, last menstrual period 02/13/2024, SpO2 100%. Estimated body mass index is 20.17 kg/m as calculated from the following:   Height as of this encounter: 5' 7.5 (1.715 m).   Weight as of this encounter: 59.3 kg.  Physical Exam  General: An intubated sedated comatose 43 year old female in no apparent distress.  HEENT: Normocephalic atraumatic, her pupils are 2 mm bilaterally.  She has fixed midline conjugate gaze.  Neck: Unremarkable  Thorax: Symmetric  Abdomen: Obese  Extremities: Fistulas  Neurologic exam: The  patient is Glasgow Coma Scale 3, recently intubated.  Her pupils are as above.  She coughs and gags.  She does not respond to painful stimuli.  Imaging studies: I have reviewed the patient's head CT and CT angiogram.  She has a pretty pontine hemorrhage, and return hemorrhage, moderate communicating hydrocephalus.  I do not see any obvious aneurysms on the CTA.    Assessment/Plan: Communicating hydrocephalus, intracranial hemorrhage, intraventricular hemorrhage: I have discussed the situation with Dr. Lindzen.  Recommend placement of a vent ventriculostomy.  There are no family members available and the  patient is not able to give consent so this will be done under implied consent emergency.  [name] 02/14/2024, 4:16 PM

## 2024-02-14 NOTE — Progress Notes (Signed)
 ABG drawn and sent to lab

## 2024-02-14 NOTE — Code Documentation (Signed)
 [name] [name] is a 43 yr old female inpatient on [facility] with Nausea and vomiting. She was LKW last night at 2000, and is now being activated as a code stroke for decreased LOC. Code stroke activated by Rapid Response RN. Pt is on no anticoagulant.    Stroke team to pt's room. BP, CBG obtained. NIHSS 26. Please see documentation for NIHSS details. Pt to CT with team. The following imaging was obtained: CT, CTA. Per Dr. Lindzen, CT is positive for ICH and hydrocephalus. Pt urgently transferred to [facility] ICU upon completion of imaging.     Pt is not a candidate for TNK or NIR due to ICH. Bedside handoff with Tiffany RN [facility] complete.  Care Plan: HOB > 30 degrees NIHSS and VS and pupil exam q 1 hour. SBP 130-150  Mechele Spiegel RN Stroke Response

## 2024-02-14 NOTE — Progress Notes (Signed)
 SLP Cancellation Note  Patient Details Name: [name] [name] MRN: [id number] DOB: October 29, 1980   Cancelled treatment:       Reason Eval/Treat Not Completed: Medical issues which prohibited therapy (emergently intubated). SLP will continue following for readiness to participate in evaluation.    Amil Kale, M.A., CCC-SLP Speech Language Pathology, Acute Rehabilitation Services  Secure Chat preferred [phone]  02/14/2024, 3:46 PM

## 2024-02-14 NOTE — Procedures (Signed)
 Central Venous Catheter Insertion Procedure Note  [name] [name]  [id number]  09/05/1980  Date:02/14/24  Time:3:43 PM   Provider Performing:Baudelia Schroepfer R Niccolo Burggraf   Procedure: Insertion of Non-tunneled Central Venous Catheter(36556) with US  guidance (40981)   Indication(s) Medication administration  Consent Unable to obtain consent due to emergent nature of procedure.  Anesthesia Topical only with 1% lidocaine    Timeout Verified patient identification, verified procedure, site/side was marked, verified correct patient position, special equipment/implants available, medications/allergies/relevant history reviewed, required imaging and test results available.  Sterile Technique Maximal sterile technique including full sterile barrier drape, hand hygiene, sterile gown, sterile gloves, mask, hair covering, sterile ultrasound probe cover (if used).  Procedure Description Area of catheter insertion was cleaned with chlorhexidine  and draped in sterile fashion.  With real-time ultrasound guidance a central venous catheter was placed into the right internal jugular vein. Nonpulsatile blood flow and easy flushing noted in all ports.  The catheter was sutured in place and sterile dressing applied.  Complications/Tolerance None; patient tolerated the procedure well. Chest X-ray is ordered to verify placement for internal jugular or subclavian cannulation.   Chest x-ray is not ordered for femoral cannulation.  EBL Minimal  Specimen(s) None   [name] [name] [name] Giacomo, PA-C

## 2024-02-14 NOTE — Procedures (Signed)
 Central Venous Catheter Insertion Procedure Note  [name] [name]  [id number]  04-25-1981  Date:02/14/24  Time:5:01 PM   Provider Performing:Kjerstin Abrigo Josephus Nida   Procedure: Insertion of Non-tunneled Central Venous Catheter(36556)with US  guidance (22025)    Indication(s) Hemodialysis  Consent Unable to obtain consent due to emergent nature of procedure.  Anesthesia Topical only with 1% lidocaine    Timeout Verified patient identification, verified procedure, site/side was marked, verified correct patient position, special equipment/implants available, medications/allergies/relevant history reviewed, required imaging and test results available.  Sterile Technique Maximal sterile technique including full sterile barrier drape, hand hygiene, sterile gown, sterile gloves, mask, hair covering, sterile ultrasound probe cover (if used).  Procedure Description Area of catheter insertion was cleaned with chlorhexidine  and draped in sterile fashion.   With real-time ultrasound guidance a HD catheter was placed into the right femoral vein.  Nonpulsatile blood flow and easy flushing noted in all ports.  The catheter was sutured in place and sterile dressing applied.  Complications/Tolerance None; patient tolerated the procedure well. Chest X-ray is ordered to verify placement for internal jugular or subclavian cannulation.  Chest x-ray is not ordered for femoral cannulation.  EBL Minimal  Specimen(s) None

## 2024-02-14 NOTE — Procedures (Signed)
 Arterial Catheter Insertion Procedure Note  Anay Naseer Ibe  [id number]  1981-01-12  Date:02/14/24  Time:5:02 PM    Provider Performing: Josiah Nigh    Procedure: Insertion of Arterial Line (40981) with US  guidance (19147)   Indication(s) Blood pressure monitoring and/or need for frequent ABGs  Consent Unable to obtain consent due to emergent nature of procedure.  Anesthesia None   Time Out Verified patient identification, verified procedure, site/side was marked, verified correct patient position, special equipment/implants available, medications/allergies/relevant history reviewed, required imaging and test results available.   Sterile Technique Maximal sterile technique including full sterile barrier drape, hand hygiene, sterile gown, sterile gloves, mask, hair covering, sterile ultrasound probe cover (if used).   Procedure Description Area of catheter insertion was cleaned with chlorhexidine  and draped in sterile fashion. With real-time ultrasound guidance an arterial catheter was placed into the right femoral artery.  Appropriate arterial tracings confirmed on monitor.     Complications/Tolerance None; patient tolerated the procedure well.   EBL Minimal   Specimen(s) None

## 2024-02-14 NOTE — Progress Notes (Signed)
 Full consult to follow  Called for AMS HTN, due for HD  Disconjugate gaze, L facial droop, drooling, not following commands, withdraws to pain BUE BLE but L seems weaker.  C/f bleed w her HTN overnight or stroke otherwise    Calling for code stroke Temporizing measures for K PRNs for BP       Eston Hence MSN, AGACNP-BC [facility] Pulmonary/Critical Care Medicine 02/14/2024, 2:07 PM

## 2024-02-14 NOTE — Consult Note (Signed)
 NEUROLOGY CONSULT   Date of service: February 14, 2024 Patient Name: [name] [name] MRN:  [id number] DOB:  01/23/1981 Chief Complaint: Code Stroke Inpatient  History of Present Illness  Aalyiah Naseer Woolworth is a 43 y.o. female with hx of  hypertension, ESRD on HD, s/p renal and pancreatic transplant, diabetes mellitus type 1 and gastroparesis who presents with nausea and vomiting since after dialysis. She was admitted to the hospitalist service for colitis. She had a couple of episodes of agitation and received diluadid. Per documentation her blood pressure has been elevated since admission yesterday. A rapid response and a code stroke were activated due to her decreased responsiveness on the floor. She also received narcan  without change in neuro status. 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 Neurosurgery consulted for EVD placement which was also completed 6/12 after emergent evaluation by Neurology. .   Last known well: 2000 Modified rankin score: 0-Completely asymptomatic and back to baseline post- stroke ICH Score: 4   NIHSS components Score: Comment  1a Level of Conscious 0[]  1[]  2[x]  3[]      1b LOC Questions 0[]  1[]  2[x]       1c LOC Commands 0[]  1[]  2[x]       2 Best Gaze 0[]  1[x]  2[]       3 Visual 0[]  1[]  2[]  3[x]      4 Facial Palsy 0[]  1[x]  2[]  3[]      5a Motor Arm - left 0[]  1[]  2[]  3[x]  4[]  UN[]    5b Motor Arm - Right 0[]  1[]  2[x]  3[]  4[]  UN[]    6a Motor Leg - Left 0[]  1[]  2[]  3[x]  4[]  UN[]    6b Motor Leg - Right 0[]  1[]  2[x]  3[]  4[]  UN[]    7 Limb Ataxia 0[x]  1[]  2[]  UN[]      8 Sensory 0[x]  1[]  2[]  UN[]      9 Best Language 0[]  1[]  2[]  3[x]      10 Dysarthria 0[]  1[]  2[x]  UN[]      11 Extinct. and Inattention 0[x]  1[]  2[]       TOTAL:26       ROS   Unable to perform due to altered mental status   Past History   Past Medical History:  Diagnosis Date   Anemia    Anxiety    DM (diabetes mellitus), type 1 (HCC)    ESRD on hemodialysis (HCC)    Gastroparesis     Hypertension    Narcotic abuse (HCC)    Non compliance with medical treatment    Vision loss    Past Surgical History:  Procedure Laterality Date   A/V FISTULAGRAM Right 02/17/2021   Procedure: A/V FISTULAGRAM;  Surgeon: Young Hensen, MD;  Location: [facility] INVASIVE CV [facility];  Service: Cardiovascular;  Laterality: Right;   AV FISTULA PLACEMENT  11/17/2011   Procedure: ARTERIOVENOUS (AV) FISTULA CREATION;  Surgeon: Mayo Speck, MD;  Location: [facility] OR;  Service: Vascular;  Laterality: Right;   BIOPSY  12/03/2021   Procedure: BIOPSY;  Surgeon: Albertina Hugger, MD;  Location: [facility] ENDOSCOPY;  Service: Gastroenterology;;   ESOPHAGOGASTRODUODENOSCOPY (EGD) WITH PROPOFOL  N/A 12/03/2021   Procedure: ESOPHAGOGASTRODUODENOSCOPY (EGD) WITH PROPOFOL ;  Surgeon: Albertina Hugger, MD;  Location: [facility] ENDOSCOPY;  Service: Gastroenterology;  Laterality: N/A;   EYE SURGERY     HEMATOMA EVACUATION Right 02/25/2021   Procedure: EVACUATION HEMATOMA RIGHT CHEST;  Surgeon: Adine Hoof, MD;  Location: [facility] OR;  Service: Vascular;  Laterality: Right;   INSERTION OF DIALYSIS CATHETER Right 12/08/2020   Procedure: INSERTION  OF TUNNELED DIALYSIS CATHETER;  Surgeon: Young Hensen, MD;  Location: [facility] OR;  Service: Vascular;  Laterality: Right;   IR REMOVAL TUN CV CATH W/O FL  05/03/2021   KIDNEY TRANSPLANT     NEPHRECTOMY TRANSPLANTED ORGAN     pancrease transplant     PERIPHERAL VASCULAR BALLOON ANGIOPLASTY Right 02/17/2021   Procedure: PERIPHERAL VASCULAR BALLOON ANGIOPLASTY;  Surgeon: Young Hensen, MD;  Location: [facility]VAS[facility] CV LAB;  Service: Cardiovascular;  Laterality: Right;   REFRACTIVE SURGERY     REVISON OF ARTERIOVENOUS FISTULA Right 12/08/2020   Procedure: RIGHT ARM ARTERIOVENOUS FISTULA REVISION AND RESECTION;  Surgeon: Young Hensen, MD;  Location: [facility] OR;  Service: Vascular;  Laterality: Right;   REVISON OF ARTERIOVENOUS FISTULA Right 02/25/2021   Procedure: RIGHT ARM  ARTERIOVENOUS FISTULA REVISION WITH TRANSPOSITION OF CEPHALIC VEIN ON AXILLARY VEIN;  Surgeon: Young Hensen, MD;  Location: [facility] OR;  Service: Vascular;  Laterality: Right;   Family History  Problem Relation Age of Onset   Hypertension Father    Social History   Socioeconomic History   Marital status: Married    Spouse name: Not on file   Number of children: Not on file   Years of education: Not on file   Highest education level: Not on file  Occupational History   Not on file  Tobacco Use   Smoking status: Former    Types: Pipe   Smokeless tobacco: Never   Tobacco comments:    Hooka  Vaping Use   Vaping status: Never Used  Substance and Sexual Activity   Alcohol  use: No   Drug use: No   Sexual activity: Not Currently  Other Topics Concern   Not on file  Social History Narrative   Worked for [place]  [employer]--as did husband. Had to leave [place] for safety reasons. Has been in [place]  since 9/09.   Social Drivers of Corporate investment banker Strain: Not on file  Food Insecurity: No Food Insecurity (11/06/2023)   Hunger Vital Sign    Worried About Running Out of Food in the Last Year: Never true    Ran Out of Food in the Last Year: Never true  Transportation Needs: No Transportation Needs (11/06/2023)   PRAPARE - Administrator, Civil Service (Medical): No    Lack of Transportation (Non-Medical): No  Physical Activity: Not on file  Stress: Not on file  Social Connections: Unknown (10/04/2022)   Received from [facility]   Social Network    Social Network: Not on file   No Known Allergies  Medications   Medications Prior to Admission  Medication Sig Dispense Refill Last Dose/Taking   amLODipine  (NORVASC ) 10 MG tablet Take 1 tablet (10 mg total) by mouth daily. 30 tablet 2    aspirin  EC 81 MG tablet Chew 81 mg by mouth daily.      busPIRone  (BUSPAR ) 10 MG tablet Take 10 mg by mouth 2 (two) times daily.      carvedilol  (COREG ) 12.5 MG tablet Take 1 tablet (12.5  mg total) by mouth 2 (two) times daily with a meal. (Patient not taking: Reported on 11/06/2023) 60 tablet 2    cinacalcet  (SENSIPAR ) 30 MG tablet Take 3 tablets (90 mg total) by mouth daily with supper. 60 tablet 1    cloNIDine  (CATAPRES  - DOSED IN MG/24 HR) 0.2 mg/24hr patch 0.2 mg once a week.      cycloSPORINE  modified (NEORAL ) 100 MG capsule Take 2 capsules (200  mg total) by mouth 2 (two) times daily.      cycloSPORINE  modified (NEORAL ) 25 MG capsule Take 25 mg by mouth daily.      Darbepoetin Alfa  (ARANESP ) 60 MCG/0.3ML SOSY injection Inject 0.3 mLs (60 mcg total) into the vein every Friday with hemodialysis. 4.2 mL     doxercalciferol  (HECTOROL ) 4 MCG/2ML injection Inject 3 mLs (6 mcg total) into the vein every Monday, Wednesday, and Friday with hemodialysis. 1 mL     DULoxetine  (CYMBALTA ) 20 MG capsule Take 1 capsule (20 mg total) by mouth daily. 30 capsule 2    ferric citrate  (AURYXIA ) 1 GM 210 MG(Fe) tablet Take 210-630 mg by mouth See admin instructions. Take 3 tablets (630 mg) by mouth three times daily with meals, and take 3 tablets (630 mg) with snacks      gabapentin  (NEURONTIN ) 100 MG capsule Take 100 mg by mouth 2 (two) times daily.      hydrALAZINE  (APRESOLINE ) 25 MG tablet Take 3 tablets (75 mg total) by mouth every 8 (eight) hours. 270 tablet 2    hydrOXYzine  (VISTARIL ) 25 MG capsule Take 25 mg by mouth 3 (three) times daily as needed for anxiety.      lidocaine -prilocaine  (EMLA ) cream Apply 1 application  topically daily as needed (prior to port access).      losartan  (COZAAR ) 100 MG tablet Take 1 tablet (100 mg total) by mouth daily. 30 tablet 2    MELATONIN PO Take 1 tablet by mouth at bedtime.      metoCLOPramide  (REGLAN ) 5 MG tablet Take 5 mg by mouth every 6 (six) hours as needed for nausea or vomiting.      mycophenolate  (MYFORTIC ) 180 MG EC tablet Take 540 mg by mouth 2 (two) times daily.      ondansetron  (ZOFRAN ) 4 MG tablet Take 4 mg by mouth every 8 (eight) hours as  needed for nausea or vomiting.      oxyCODONE -acetaminophen  (PERCOCET/ROXICET) 5-325 MG tablet Take 1 tablet by mouth 3 (three) times daily.      predniSONE  (DELTASONE ) 5 MG tablet Take 15 mg by mouth daily.  5    sodium zirconium cyclosilicate  (LOKELMA ) 10 g PACK packet Take 10 g by mouth daily.      XPHOZAH 30 MG TABS Take 1 tablet by mouth 2 (two) times daily.      zolpidem  (AMBIEN ) 5 MG tablet Take 5 mg by mouth at bedtime as needed for sleep.        Vitals   Vitals:   02/14/24 1043 02/14/24 1100 02/14/24 1200 02/14/24 1315  BP:  (!) 221/79 (!) 230/76 (!) 163/148  Pulse:  83    Resp: (!) 36 (!) 29 (!) 39 (!) 30  Temp:  99.2 F (37.3 C)    TempSrc:  Axillary    SpO2: 95%   100%  Weight:      Height:         Body mass index is 20.17 kg/m.  Physical Exam   Constitutional: Ill-appearing middle-aged female.   Psych: Unresponsive  Eyes: No scleral injection.  HENT: No OP obstruction.  Head: Normocephalic.  Cardiovascular: tachycardic Respiratory: non re breather mask in place GI: Soft.  No distension. There is no tenderness.  Skin: WDI.   Neurologic Examination   Mental Status: Awake but unresponsive to questions and commands.  Patient does have some purposeful movement with noxious stimuli.  Does not open eyes to command, but they will open to noxious. Does not make  eye contact. Appears acutely encephalopathic.  Cranial Nerves: II: Pupils are 2 mm and reactive, does not blink to threat III,IV, VI: Gaze is disconjugate with forced eye opening. VII: left facial asymmetry  VIII: Hearing is intact to voice X: Gag is intact XI: Head preferentially rotated to the left Motor/sensory: Tone is normal. Bulk is normal.  Withdraws to painful stimuli more on the right than the left. Does have antigravity strength in all extremities. Does not follow commands for detailed strength testing.  Gait: Deferred due to acuity of presentation   Labs   CBC:  Recent Labs  Lab  02/13/24 2109 02/14/24 1243  WBC 4.4 11.2*  HGB 11.9* 12.2  HCT 37.3 37.5  MCV 94.7 92.8  PLT 91* 109*    Basic Metabolic Panel:  Lab Results  Component Value Date   NA 139 02/14/2024   K 6.1 (H) 02/14/2024   CO2 25 02/14/2024   GLUCOSE 130 (H) 02/14/2024   BUN 36 (H) 02/14/2024   CREATININE 6.33 (H) 02/14/2024   CALCIUM  9.6 02/14/2024   GFRNONAA 8 (L) 02/14/2024   GFRAA  02/02/2022    PATIENT IDENTIFICATION ERROR. PLEASE DISREGARD RESULTS. ACCOUNT WILL BE CREDITED.   Lipid Panel:  Lab Results  Component Value Date   LDLCALC UNABLE TO CALCULATE IF TRIGLYCERIDE OVER 400 mg/dL 51/88/4166   AYTK1S:  Lab Results  Component Value Date   HGBA1C 4.2 (L) 02/14/2024   Urine Drug Screen: No results found for: LABOPIA, COCAINSCRNUR, LABBENZ, AMPHETMU, THCU, LABBARB  Alcohol  Level     Component Value Date/Time   ETH <10 01/07/2022 0005   INR  Lab Results  Component Value Date   INR 1.2 06/13/2023   APTT  Lab Results  Component Value Date   APTT 31 06/13/2023     CT Head without contrast(Personally reviewed): 1. Large multi compartment intracranial hemorrhage as described above. Dominant focus of hemorrhage involving the prepontine cistern extending into the upper cervical spinal canal. 2. Intraventricular blood products layering in the occipital horns. 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 3. No midline shift. 4. ASPECTS is 10  CT Angio Head and Neck: No large vessel occlusion.   No high-grade stenosis, aneurysm, or dissection of the arteries in the head and neck. Multifocal atherosclerosis as above. Mild stenosis of the V4 segments of both vertebral arteries. Mild stenosis of the right supraclinoid ICA. Mild irregularity of the basilar artery without significant narrowing. Finding concerning for mild vasospasm. Consolidative and patchy opacities in the bilateral upper lobes and superior segments of  the bilateral lower lobes. Recommend correlation with dedicated chest imaging.  MRI Brain: Pending    Impression  [name] [name] is a 43 y.o. female with hx of  hypertension, ESRD on HD, s/p renal and pancreatic transplant now failed, diabetes mellitus type 1 and gastroparesis who presented to the hospital yesterday with nausea and vomiting since after dialysis. She was admitted to hospitalist service. Code stroke called this afternoon due to decreased responsiveness. LKN was 2000 yesterday. Patient found by staff to be minimally responsive with dysconjugate gaze while on 5W.  - Exam reveals an obtunded and ill-appearing female with multiple deficits NIHSS 26.  - CT head:  Large focus of extra-axial hemorrhage filling the prepontine cistern extending inferiorly to the foramen magnum and into the cervical spinal canal. Confluent region of hemorrhage in the prepontine cistern measures 4.1 x 2.0 cm in axial dimensions with craniocaudal extent of at least 5.2 cm to the  level of the foramen magnum. There is mass effect on the brainstem. Additional subdural hemorrhage along the posterior falx and tentorial leaflets. Scattered areas of subarachnoid hemorrhage particularly along the left sylvian fissure. There are intraventricular blood products layering in both occipital horns. Encephalomalacia in the inferior medial left cerebellum concerning for remote infarct. No midline shift. Prominence of the ventricles concerning for developing communicating hydrocephalus.  - CTA of head and neck: No large vessel occlusion. No high-grade stenosis, aneurysm, or dissection of the arteries in the head and neck. Multifocal atherosclerosis as above. Mild stenosis of the V4 segments of both vertebral arteries. Mild stenosis of the right supraclinoid ICA. Mild irregularity of the basilar artery without significant narrowing; finding concerning for mild vasospasm. Consolidative and patchy opacities in the bilateral upper  lobes and superior segments of the bilateral lower lobes; recommend correlation with dedicated chest imaging. - Neurosurgery consulted STAT for EVD placement. This has been completed.  - Transported to the ICH with plans for CRRT.  - CT head ordered for tonight and then MRI planned for when she is more stable.     Recommendations  - BP Goal 130-150 for 24 hours and then less than 160 if stable - IV cleviprex ordered - Echocardiogram  - NSGY is following for monitoring of EVD   - Repeat CT Scan timed for 2100  - MRI Brain WITHOUT contrast when stable - Dialysis/CRRT per Nephrology team  - Consider starting nimodipine for prophylaxis against cerebral vasospasm - would discuss with Neurosurgery - Consider starting Keppra for seizure prophylaxis - would also discuss with Neurosurgery  Addendum: Repeat CT head at 1953: 1. Interval placement of a right frontal approach ventriculostomy with tip in the body of the right lateral ventricle. Persistent but relatively stable hydrocephalus at this time. 2. Moderate volume acute subarachnoid hemorrhage with associated IVH, similar to prior. No evidence for significant interval bleeding since prior. 3. No other new acute intracranial abnormality.  ______________________________________________________________________   Arletha Bend, NP [name]  I have seen and examined the patient. I have formulated the assessment and recommendations.43 y.o. female with hx of  hypertension, ESRD on HD, s/p renal and pancreatic transplant now failed, diabetes mellitus type 1 and gastroparesis who presented to the hospital yesterday with nausea and vomiting since after dialysis. She was admitted to hospitalist service. Code stroke called this afternoon due to decreased responsiveness. LKN was 2000 yesterday. Patient found by staff to be minimally responsive with dysconjugate gaze while on 5W. Exam reveals an obtunded and ill-appearing  female with multiple deficits NIHSS 26. She is status-post ventricular drain placement by Neurosurgery. Recommendations as above.  Electronically signed: Dr. Dreon Pineda

## 2024-02-14 NOTE — Op Note (Signed)
 Brief history: The patient is a 43 year old female who had a acute decline in her neurologic status.  She was worked up with a head CT which demonstrated intracerebral hemorrhage and hydrocephalus.  I recommended placement of a colostomy.  The patient is not able to give consent.  There are no family members available.  Pre-Op diagnosis: Obstructive hydrocephalus, intracranial hemorrhage, intraventricular hemorrhage  Postop diagnosis: The same  Procedure: Placement of right frontal ventriculostomy via bur hole  Surgeon: Dr. Pleasant Brilliant  Assistant: None  Anesthesia: Local  Estimated blood loss: 50 cc  Specimens: None  Drains: Ventriculostomy  Complications: None  Description of procedure: A timeout was performed.  The patient's right frontal scalp was then shaved with a clippers and prepared with DuraPrep.  Sterile drapes were applied.  I injected the area to be incised with lidocaine  solution.  I was a scalpel to make a incision in the mid pupillary line in the right frontal scalp.  I used a self-retaining retractor for exposure.  I then used the egg beater drill to create a right frontal burr hole.  I incised the underlying dura with a 15 blade scalpel.  I then cannulated the patient's ventricular system with a ventriculostomy tube on the first pass.  I withdrew the stylette and threaded the ventriculostomy a bit deeper.  There was free flow of bloody spinal fluid under moderate pressure.  I tunneled the ventriculostomy.  I connected it to the drainage system and secured the connection.  I then removed the retractor and reapproximated the patient's scalp with a running 3-0 nylon suture.  I secured the ventriculostomy at several points with 3-0 nylon suture.  A sterile dressing was applied.  The drapes were removed.

## 2024-02-14 NOTE — Procedures (Signed)
 Intubation Procedure Note  [name] [name]  [id number]  Jul 29, 1981  Date:02/14/24  Time:3:43 PM   Provider Performing:Harrel Ferrone R Inis Borneman    Procedure: Intubation (31500)  Indication(s) Respiratory Failure  Consent Unable to obtain consent due to emergent nature of procedure.   Anesthesia Etomidate, Versed , Fentanyl , and Rocuronium   Time Out Verified patient identification, verified procedure, site/side was marked, verified correct patient position, special equipment/implants available, medications/allergies/relevant history reviewed, required imaging and test results available.   Sterile Technique Usual hand hygeine, masks, and gloves were used   Procedure Description Patient positioned in bed supine.  Sedation given as noted above.  Patient was intubated with endotracheal tube using Glidescope.  View was Grade 1 full glottis .  Number of attempts was 1.  Colorimetric CO2 detector was consistent with tracheal placement.   Complications/Tolerance None; patient tolerated the procedure well. Chest X-ray is ordered to verify placement.   EBL Minimal   Specimen(s) None   [name] Boozer Gregori Abril, PA-C

## 2024-02-14 NOTE — H&P (Addendum)
 History and Physical    Patient: [name] [name] MWU:[id number] DOB: [birth year] DOA: 02/13/2024 DOS: the patient was seen and examined on 02/14/2024 PCP: [facility]  Patient coming from: Home  Chief Complaint:  Chief Complaint  Patient presents with   Emesis   HPI: [name] [name] is a 43 y.o. female with medical history significant of hypertension, ESRD on HD, s/p renal and pancreatic transplant, diabetes mellitus type 1 with gastroparesis presents with nausea and vomiting since after dialysis.  History is significantly limited from the patient as she is very lethargic at this time and obtain from review of records.  It was reported that she had completed nearly all of her hemodialysis session.  Developed severe abdominal pain with nausea and vomiting.  At baseline patient had been able to answer questions and respond appropriately.  However, now just moaning and not really answering questions.  In the emergency department patient was noted temperature was 95.9 F with tachypnea, blood pressure was elevated up to 236/94, and O2 saturations noted to be low as 89% despite being high flow nasal cannula oxygen .  Labs significant for hemoglobin 11.9, platelets 91, potassium 5.8, CO2 24, BUN 28, creatinine 5.38, glucose 122, and anion gap 19 CT scan of the abdomen pelvis noted increasing thickening of the ascending, descending, and sigmoid colon concerning for colitis, new small layering pleural effusions interstitial edema, and small pericardial effusion.  Patient had initially been given normal saline bolus of 500 mL, Lokelma  10 g p.o., Zofran , fentanyl , and Dilaudid  IV.  Review of Systems: unable to review all systems due to the inability of the patient to answer questions. Past Medical History:  Diagnosis Date   Anemia    Anxiety    DM (diabetes mellitus), type 1 (HCC)    ESRD on hemodialysis (HCC)    Gastroparesis    Hypertension    Narcotic abuse (HCC)    Non compliance  with medical treatment    Vision loss    Past Surgical History:  Procedure Laterality Date   A/V FISTULAGRAM Right 02/17/2021   Procedure: A/V FISTULAGRAM;  Surgeon: Young Hensen, MD;  Location: [facility] INVASIVE CV LAB;  Service: Cardiovascular;  Laterality: Right;   AV FISTULA PLACEMENT  11/17/2011   Procedure: ARTERIOVENOUS (AV) FISTULA CREATION;  Surgeon: Mayo Speck, MD;  Location: [facility] OR;  Service: Vascular;  Laterality: Right;   BIOPSY  12/03/2021   Procedure: BIOPSY;  Surgeon: Albertina Hugger, MD;  Location: [facility] ENDOSCOPY;  Service: Gastroenterology;;   ESOPHAGOGASTRODUODENOSCOPY (EGD) WITH PROPOFOL  N/A 12/03/2021   Procedure: ESOPHAGOGASTRODUODENOSCOPY (EGD) WITH PROPOFOL ;  Surgeon: Albertina Hugger, MD;  Location: [facility] ENDOSCOPY;  Service: Gastroenterology;  Laterality: N/A;   EYE SURGERY     HEMATOMA EVACUATION Right 02/25/2021   Procedure: EVACUATION HEMATOMA RIGHT CHEST;  Surgeon: Adine Hoof, MD;  Location: [facility] OR;  Service: Vascular;  Laterality: Right;   INSERTION OF DIALYSIS CATHETER Right 12/08/2020   Procedure: INSERTION OF TUNNELED DIALYSIS CATHETER;  Surgeon: Young Hensen, MD;  Location: [facility] OR;  Service: Vascular;  Laterality: Right;   IR REMOVAL TUN CV CATH W/O FL  05/03/2021   KIDNEY TRANSPLANT     NEPHRECTOMY TRANSPLANTED ORGAN     pancrease transplant     PERIPHERAL VASCULAR BALLOON ANGIOPLASTY Right 02/17/2021   Procedure: PERIPHERAL VASCULAR BALLOON ANGIOPLASTY;  Surgeon: Young Hensen, MD;  Location: [facility] INVASIVE CV LAB;  Service: Cardiovascular;  Laterality: Right;   REFRACTIVE SURGERY  REVISON OF ARTERIOVENOUS FISTULA Right 12/08/2020   Procedure: RIGHT ARM ARTERIOVENOUS FISTULA REVISION AND RESECTION;  Surgeon: Young Hensen, MD;  Location: [facility] OR;  Service: Vascular;  Laterality: Right;   REVISON OF ARTERIOVENOUS FISTULA Right 02/25/2021   Procedure: RIGHT ARM ARTERIOVENOUS FISTULA REVISION WITH TRANSPOSITION OF CEPHALIC VEIN ON  AXILLARY VEIN;  Surgeon: Young Hensen, MD;  Location: [facility] OR;  Service: Vascular;  Laterality: Right;   Social History:  reports that she has quit smoking. Her smoking use included pipe. She has never used smokeless tobacco. She reports that she does not drink alcohol  and does not use drugs.  No Known Allergies  Family History  Problem Relation Age of Onset   Hypertension Father     Prior to Admission medications   Medication Sig Start Date End Date Taking? Authorizing Provider  amLODipine  (NORVASC ) 10 MG tablet Take 1 tablet (10 mg total) by mouth daily. 02/05/22 11/06/23  Hoyt Macleod, MD  aspirin  EC 81 MG tablet Chew 81 mg by mouth daily.    [provider]  busPIRone  (BUSPAR ) 10 MG tablet Take 10 mg by mouth 2 (two) times daily. 10/25/23   [provider]  carvedilol  (COREG ) 12.5 MG tablet Take 1 tablet (12.5 mg total) by mouth 2 (two) times daily with a meal. Patient not taking: Reported on 11/06/2023 02/05/22 11/06/23  Hoyt Macleod, MD  cinacalcet  (SENSIPAR ) 30 MG tablet Take 3 tablets (90 mg total) by mouth daily with supper. 06/14/23   Luna Salinas, MD  cycloSPORINE  modified (NEORAL ) 100 MG capsule Take 2 capsules (200 mg total) by mouth 2 (two) times daily. 12/25/21   Pokhrel, Laxman, MD  cycloSPORINE  modified (NEORAL ) 25 MG capsule Take 25 mg by mouth daily.    [provider]  Darbepoetin Alfa  (ARANESP ) 60 MCG/0.3ML SOSY injection Inject 0.3 mLs (60 mcg total) into the vein every Friday with hemodialysis. 11/18/21   Abbe Abate, MD  doxercalciferol  (HECTOROL ) 4 MCG/2ML injection Inject 3 mLs (6 mcg total) into the vein every Monday, Wednesday, and Friday with hemodialysis. 11/17/21   Abbe Abate, MD  DULoxetine  (CYMBALTA ) 20 MG capsule Take 1 capsule (20 mg total) by mouth daily. 02/06/22 11/06/23  Hoyt Macleod, MD  ferric citrate  (AURYXIA ) 1 GM 210 MG(Fe) tablet Take 210-630 mg by mouth See admin instructions. Take 3 tablets (630 mg) by mouth  three times daily with meals, and take 3 tablets (630 mg) with snacks    [provider]  gabapentin  (NEURONTIN ) 100 MG capsule Take 100 mg by mouth 2 (two) times daily. 10/25/23   [provider]  hydrALAZINE  (APRESOLINE ) 25 MG tablet Take 3 tablets (75 mg total) by mouth every 8 (eight) hours. 02/05/22 11/06/23  Hoyt Macleod, MD  hydrOXYzine  (VISTARIL ) 25 MG capsule Take 25 mg by mouth 3 (three) times daily as needed for anxiety. 05/26/23   [provider]  lidocaine -prilocaine  (EMLA ) cream Apply 1 application  topically daily as needed (prior to port access). 07/09/20   [provider]  losartan  (COZAAR ) 100 MG tablet Take 1 tablet (100 mg total) by mouth daily. 12/04/21 11/06/23  [place], Rema Care, DO  MELATONIN PO Take 1 tablet by mouth at bedtime.    [provider]  metoCLOPramide  (REGLAN ) 5 MG tablet Take 5 mg by mouth every 6 (six) hours as needed for nausea or vomiting.    [provider]  mycophenolate  (MYFORTIC ) 180 MG EC tablet Take 540 mg by mouth 2 (two) times daily.  [provider]  ondansetron  (ZOFRAN ) 4 MG tablet Take 4 mg by mouth every 8 (eight) hours as needed for nausea or vomiting.    [provider]  oxyCODONE -acetaminophen  (PERCOCET/ROXICET) 5-325 MG tablet Take 1 tablet by mouth 3 (three) times daily.    [provider]  predniSONE  (DELTASONE ) 5 MG tablet Take 15 mg by mouth daily. 06/24/15   [provider]  sodium zirconium cyclosilicate  (LOKELMA ) 10 g PACK packet Take 10 g by mouth daily.    [provider]  XPHOZAH 30 MG TABS Take 1 tablet by mouth 2 (two) times daily. 06/21/23   [provider]  zolpidem  (AMBIEN ) 5 MG tablet Take 5 mg by mouth at bedtime as needed for sleep.    [provider]    Physical Exam: Vitals:   02/14/24 0100 02/14/24 0200 02/14/24 0456 02/14/24 0500  BP: (!) 230/100 (!) 236/95  (!) 229/88  Pulse: 70 75 70 69  Resp: 20 20 (!) 21 (!)  22  Temp:  (!) 96 F (35.6 C)  (!) 95.9 F (35.5 C)  TempSrc:  Rectal  Rectal  SpO2: 90% 91% 94% 92%  Weight:      Height:       Constitutional: Middle-aged female who appears to be acutely ill Eyes: PERRL, lids and conjunctivae normal ENMT: Mucous membranes are moist.   Neck: normal, supple  Respiratory: Tachypneic with rhonchi and crackles appreciated.  O2 saturations currently maintained on high flow nasal cannula oxygen . Cardiovascular: Regular rate and rhythm, No extremity edema. 2+ pedal pulses.  Right upper extremity fistula in place with palpable thrill. Abdomen: Mild tenderness to palpation.  Bowel sounds positive.  Musculoskeletal: no clubbing / cyanosis. No joint deformity upper and lower extremities. .  Skin: Patient noted to be diaphoretic. Neurologic: CN 2-12 grossly intact.  Appears able to move all extremities. Psychiatric: Lethargic and only responsive to noxious stimuli.     Data Reviewed:   EKG revealed sinus rhythm at 71 bpm with QTc 517.  Reviewed labs, imaging, and pertinent records as documented.  Assessment and Plan:  Possible sepsis secondary to colitis Abdominal pain Nausea and vomiting History of gastroparesis Patient presents with complaints of abdominal pain with nausea and vomiting.  CT scan of the abdomen pelvis noted thickening of the ascending, descending and sigmoid colon concerning for colitis.  Other possible causes include gastroparesis. - Admitted to a progressive bed - Aspiration precautions with elevation head of the bed - N.p.o. with orders placed advance diet as tolerated. - Check blood cultures and lactic acid - Check CRP and ESR - Empiric antibiotics of Rocephin  and metronidazole  - Tigan as needed for nausea and vomiting due to prolonged QT   Acute respiratory failure with hypoxia suspected secondary to volume overload Hyperkalemia History of pancreatic/renal transplant in 2015 on chronic immunosuppression ESRD on HD Patient  with a history of failed renal transplant in 2019.  Patient just received most of her dialysis yesterday.  However, noted to be hypoxic with O2 saturations down to 89% despite being on high flow nasal cannula.  Hemoglobin at 5.8, CO2 24, BUN 28, creatinine 5.38.  CT showing reported interstitial edema.  Patient had been given 10 g of Lokelma .  Nephrology have been consulted for need of dialysis. - Continuous pulse oximetry with oxygen  to maintain O2 saturations greater than 92% - Check BNP - Check ABG - Monitor renal function panel daily - Resume home immunosuppression once able. - Nephrology consulted for need of hemodialysis  Acute metabolic encephalopathy Patient noted to be acutely lethargic and only responsive to noxious stimuli.  Initially upon presentation had been able to talk and answer questions.  Unclear if symptoms are secondary to patient's acute respiratory failure, uncontrolled hypertension, or possibility of infection. - Neurochecks - may warrant CT of the head and further evaluation if symptoms persist  Hypertensive urgency On admission blood pressures noted to be elevated up to 255/77. - Labetalol  IV and hydralazine  IV as needed for elevated blood pressure - Resume home blood pressure medication regimen once able  Prolonged QT interval Acute.  QTc elevated at 517. - Avoid QT prolonging medications  Pancytopenia Chronic.  Hemoglobin noted to be 11.9 and platelet count 91 which appears around patient's baseline. - Add-on hemoglobin A1c - Continue to monitor and determine need sliding scale of insulin   Diabetes mellitus type 2, without long-term use of insulin  Last available hemoglobin A1c was 4.1 when checked 11/11/2021.  Glucose noted to be 130. 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  DVT prophylaxis: SCDs Advance Care Planning:   Code Status: Full Code   Consults: Nephrology  Family Communication: Attempted   Severity of Illness: The appropriate patient status for this patient is INPATIENT. Inpatient status is judged to be reasonable and necessary in order to provide the required intensity of service to ensure the patient's safety. The patient's presenting symptoms, physical exam findings, and initial radiographic and laboratory data in the context of their chronic comorbidities is felt to place them at high risk for further clinical deterioration. Furthermore, it is not anticipated that the patient will be medically stable for discharge from the hospital within 2 midnights of admission.   * I certify that at the point of admission it is my clinical judgment that the patient will require inpatient hospital care spanning beyond 2 midnights from the point of admission due to high intensity of service, high risk for further deterioration and high frequency of surveillance required.*  Author: Lena Qualia, MD 02/14/2024 7:10 AM  For on call review [email or web address].

## 2024-02-14 NOTE — Progress Notes (Signed)
 Update: Pt was seen and examined again in ICU.  She is intubated, s/p ventriculostomy for intracranial bleed.  K high.   Plan for CRRT Pre- and post filer low k bath UF 50-100 cc/hr Avoid heparin , pre-filter rate 600cc  D/w ICU provider, nurse and team.

## 2024-02-14 NOTE — Consult Note (Addendum)
 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 Hypertensive in ED SBP 240s. Went for CT a/p and was c/f some colitis. Started on abx, given IVF. C/o pain and was very agitated, rcvd pain meds. Admitted 6/12. Over the course of the day she became less responsive. Remained hypertensive w SBPs >200. Incr O2 req to [facility]. Pt was to rcv HD 6/12 afternoon but when eval by HD NP, concerns for airway protection. Rcvd narcan  with only modest improvement. PCCM consulted in this setting    Pertinent  Medical History  Failed renal / pancreas txp ESRD  HTN DM1 gastroparesis   Significant Hospital Events: Including procedures, antibiotic start and stop dates in addition to other pertinent events   6/11 ED w n/v AMS. Abx IVF for colitis 6/12 admit. AMS decr LOC. Incr O2 req. PCCM consult and code stroke called seen w rapid response nephro NP then neuro   Interim History / Subjective:  Code stroke called   Objective    Blood pressure (!) 131/58, pulse (!) 108, temperature 99.2 F (37.3 C), temperature source Axillary, resp. rate 18, height 5' 7.5 (1.715 m), weight 59.3 kg, last menstrual period 02/13/2024, SpO2 100%.    Vent Mode: PRVC FiO2 (%):  [100 %] 100 % Set Rate:  [18 bmp] 18 bmp Vt Set:  [500 mL] 500 mL PEEP:  [5 cmH20-10 cmH20] 5 cmH20 Plateau Pressure:  [25 cmH20] 25 cmH20   Intake/Output Summary (Last 24 hours) at 02/14/2024 1441 Last data filed at 02/14/2024 1435 Gross per 24 hour  Intake 203.42 ml  Output --  Net 203.42 ml   Filed Weights   02/13/24 2007  Weight: 59.3 kg    Examination: General: Chronically and critically ill appearing middle aged F  Neuro: she  moans and withdraws to pain BUE BLE. L side maybe more sluggish. Disconjugate gaze. Drooling, L facial droop  HENT: Protuberant tongue Anicteric sclera  Lungs: Incr RR Symmetrical chest expansion  Cardiovascular: tachycardic  regular  Abdomen: soft  Extremities: RUE fistula  GU: defer   Resolved problem list   Assessment and Plan   Large intracranial hemorrhage with brain compression -ICH score 4 Scattered SAH  Communicating hydrocephalus  Hypertensive emergency  Acute respiratory failure w hypoxia Aspiration PNA  Pulmonary edema  ESRD on HD  Hyperkalemia  Hx failed renal txp & s/p pancreatic txp (2014) on chronic immunosuppression DM1 (sp pancreas txp) Possible colitis  Hx prior ischemic stroke  P -Code stroke called, appreciate stroke team eval: CTA head pending  -clevi gtt -NSGY consult -- ? If will need evd  -significant morbidity/mortality risk associated w ICH score  -intubate in ICU -VAP, pulm hygiene -will change to zosyn   -rcvd temporizing K measures; when we settle out in the ICU will call for nephro for iHD (has the BP to tolerate)  -likely will need CVC  -will discuss utility of HTS or mannitol  -mycophenolate  and pred 15 on hold  -- restart when able  -ssi   Best Practice (right click and Reselect all SmartList Selections daily)   Diet/type: NPO DVT prophylaxis SCD Pressure ulcer(s): pressure ulcer assessment deferred  GI prophylaxis: PPI Lines: N/A Foley:  N/A Code Status:  full code Last date of multidisciplinary goals of care discussion [--]  Labs   CBC: Recent Labs  Lab 02/13/24 2109 02/14/24 1243  WBC 4.4 11.2*  HGB 11.9* 12.2  HCT 37.3 37.5  MCV 94.7 92.8  PLT 91* 109*    Basic Metabolic Panel: Recent Labs  Lab 02/13/24 2109 02/14/24 0949  NA 136 139  K 5.8* 6.1*  CL 93* 97*  CO2 24 25  GLUCOSE 122* 130*  BUN 28* 36*  CREATININE 5.38* 6.33*  CALCIUM  9.6 9.6  PHOS  --  6.1*   GFR: Estimated Creatinine Clearance: 10.8  mL/min (A) (by C-G formula based on SCr of 6.33 mg/dL (H)). Recent Labs  Lab 02/13/24 2109 02/14/24 0949 02/14/24 1243  WBC 4.4  --  11.2*  LATICACIDVEN  --  1.4 1.3    Liver Function Tests: Recent Labs  Lab 02/13/24 2109 02/14/24 0949  AST 16  --   ALT 11  --   ALKPHOS 92  --   BILITOT 0.8  --   PROT 7.6  --   ALBUMIN 3.9 3.7   Recent Labs  Lab 02/13/24 2109  LIPASE 35   No results for input(s): AMMONIA in the last 168 hours.  ABG    Component Value Date/Time   PHART 7.47 (H) 02/14/2024 1013   PCO2ART 40 02/14/2024 1013   PO2ART 82 (L) 02/14/2024 1013   HCO3 29.0 (H) 02/14/2024 1013   TCO2 30 12/23/2021 1006   ACIDBASEDEF 5.2 (H) 01/13/2015 0300   O2SAT 95.3 02/14/2024 1013     Coagulation Profile: No results for input(s): INR, PROTIME in the last 168 hours.  Cardiac Enzymes: No results for input(s): CKTOTAL, CKMB, CKMBINDEX, TROPONINI in the last 168 hours.  HbA1C: Hgb A1c MFr Bld  Date/Time Value Ref Range Status  02/14/2024 09:49 AM 4.2 (L) 4.8 - 5.6 % Final    Comment:    (NOTE) Diagnosis of Diabetes The following HbA1c ranges recommended by the American Diabetes Association (ADA) may be used as an aid in the diagnosis of diabetes mellitus.  Hemoglobin             Suggested A1C NGSP%              Diagnosis  <5.7                   Non Diabetic  5.7-6.4                Pre-Diabetic  >6.4                   Diabetic  <7.0                   Glycemic control for                       adults with diabetes.    11/11/2021 02:25 AM 4.1 (L) 4.8 - 5.6 % Final    Comment:    (NOTE) Pre diabetes:          5.7%-6.4%  Diabetes:              >6.4%  Glycemic control for   <7.0% adults with diabetes     CBG: Recent Labs  Lab 02/14/24 0911 02/14/24 1011  GLUCAP 141* 144*    Review of Systems:   Unable to obtain   Past Medical History:  She,  has a past medical history of Anemia, Anxiety, DM (diabetes mellitus), type 1 (HCC),  ESRD on hemodialysis (HCC), Gastroparesis, Hypertension, Narcotic abuse (HCC), Non compliance with medical treatment, and Vision loss.   Surgical History:   Past Surgical History:  Procedure Laterality Date   A/V FISTULAGRAM Right 02/17/2021   Procedure: A/V FISTULAGRAM;  Surgeon: Young Hensen, MD;  Location: [facility] INVASIVE CV LAB;  Service: Cardiovascular;  Laterality: Right;   AV FISTULA PLACEMENT  11/17/2011   Procedure: ARTERIOVENOUS (AV) FISTULA CREATION;  Surgeon: Mayo Speck, MD;  Location: [facility] OR;  Service: Vascular;  Laterality: Right;   BIOPSY  12/03/2021   Procedure: BIOPSY;  Surgeon: Albertina Hugger, MD;  Location: [facility] ENDOSCOPY;  Service: Gastroenterology;;   ESOPHAGOGASTRODUODENOSCOPY (EGD) WITH PROPOFOL  N/A 12/03/2021   Procedure: ESOPHAGOGASTRODUODENOSCOPY (EGD) WITH PROPOFOL ;  Surgeon: Albertina Hugger, MD;  Location: [facility] ENDOSCOPY;  Service: Gastroenterology;  Laterality: N/A;   EYE SURGERY     HEMATOMA EVACUATION Right 02/25/2021   Procedure: EVACUATION HEMATOMA RIGHT CHEST;  Surgeon: Adine Hoof, MD;  Location: [facility] OR;  Service: Vascular;  Laterality: Right;   INSERTION OF DIALYSIS CATHETER Right 12/08/2020   Procedure: INSERTION OF TUNNELED DIALYSIS CATHETER;  Surgeon: Young Hensen, MD;  Location: [facility] OR;  Service: Vascular;  Laterality: Right;   IR REMOVAL TUN CV CATH W/O FL  05/03/2021   KIDNEY TRANSPLANT     NEPHRECTOMY TRANSPLANTED ORGAN     pancrease transplant     PERIPHERAL VASCULAR BALLOON ANGIOPLASTY Right 02/17/2021   Procedure: PERIPHERAL VASCULAR BALLOON ANGIOPLASTY;  Surgeon: Young Hensen, MD;  Location: [facility] INVASIVE CV LAB;  Service: Cardiovascular;  Laterality: Right;   REFRACTIVE SURGERY     REVISON OF ARTERIOVENOUS FISTULA Right 12/08/2020   Procedure: RIGHT ARM ARTERIOVENOUS FISTULA REVISION AND RESECTION;  Surgeon: Young Hensen, MD;  Location: [facility] OR;  Service: Vascular;  Laterality: Right;   REVISON OF ARTERIOVENOUS  FISTULA Right 02/25/2021   Procedure: RIGHT ARM ARTERIOVENOUS FISTULA REVISION WITH TRANSPOSITION OF CEPHALIC VEIN ON AXILLARY VEIN;  Surgeon: Young Hensen, MD;  Location: [facility] OR;  Service: Vascular;  Laterality: Right;     Social History:   reports that she has quit smoking. Her smoking use included pipe. She has never used smokeless tobacco. She reports that she does not drink alcohol  and does not use drugs.   Family History:  Her family history includes Hypertension in her father.   Allergies No Known Allergies   Home Medications  Prior to Admission medications   Medication Sig Start Date End Date Taking? Authorizing Provider  amLODipine  (NORVASC ) 10 MG tablet Take 1 tablet (10 mg total) by mouth daily. 02/05/22 11/06/23  Hoyt Macleod, MD  aspirin  EC 81 MG tablet Chew 81 mg by mouth daily.    [provider]  busPIRone  (BUSPAR ) 10 MG tablet Take 10 mg by mouth 2 (two) times daily. 10/25/23   [provider]  carvedilol  (COREG ) 12.5 MG tablet Take 1 tablet (12.5 mg total) by mouth 2 (two) times daily with a meal. Patient not taking: Reported on 11/06/2023 02/05/22 11/06/23  Hoyt Macleod, MD  cinacalcet  (SENSIPAR ) 30 MG tablet Take 3 tablets (90 mg total) by mouth daily with supper. 06/14/23   Amin, Sumayya, MD  cloNIDine  (CATAPRES  - DOSED IN MG/24 HR) 0.2 mg/24hr patch 0.2 mg once a week.    [provider]  cycloSPORINE  modified (NEORAL ) 100 MG capsule Take 2 capsules (200 mg total) by mouth  2 (two) times daily. 12/25/21   Pokhrel, Laxman, MD  cycloSPORINE  modified (NEORAL ) 25 MG capsule Take 25 mg by mouth daily.    [provider]  Darbepoetin Alfa  (ARANESP ) 60 MCG/0.3ML SOSY injection Inject 0.3 mLs (60 mcg total) into the vein every Friday with hemodialysis. 11/18/21   Abbe Abate, MD  doxercalciferol  (HECTOROL ) 4 MCG/2ML injection Inject 3 mLs (6 mcg total) into the vein every Monday, Wednesday, and Friday with hemodialysis. 11/17/21   Abbe Abate, MD  DULoxetine  (CYMBALTA ) 20 MG capsule Take 1 capsule (20 mg total) by mouth daily. 02/06/22 11/06/23  Hoyt Macleod, MD  ferric citrate  (AURYXIA ) 1 GM 210 MG(Fe) tablet Take 210-630 mg by mouth See admin instructions. Take 3 tablets (630 mg) by mouth three times daily with meals, and take 3 tablets (630 mg) with snacks    [provider]  gabapentin  (NEURONTIN ) 100 MG capsule Take 100 mg by mouth 2 (two) times daily. 10/25/23   [provider]  hydrALAZINE  (APRESOLINE ) 25 MG tablet Take 3 tablets (75 mg total) by mouth every 8 (eight) hours. 02/05/22 11/06/23  Hoyt Macleod, MD  hydrOXYzine  (VISTARIL ) 25 MG capsule Take 25 mg by mouth 3 (three) times daily as needed for anxiety. 05/26/23   [provider]  lidocaine -prilocaine  (EMLA ) cream Apply 1 application  topically daily as needed (prior to port access). 07/09/20   [provider]  losartan  (COZAAR ) 100 MG tablet Take 1 tablet (100 mg total) by mouth daily. 12/04/21 11/06/23  [place], Rema Care, DO  MELATONIN PO Take 1 tablet by mouth at bedtime.    [provider]  metoCLOPramide  (REGLAN ) 5 MG tablet Take 5 mg by mouth every 6 (six) hours as needed for nausea or vomiting.    [provider]  mycophenolate  (MYFORTIC ) 180 MG EC tablet Take 540 mg by mouth 2 (two) times daily.    [provider]  ondansetron  (ZOFRAN ) 4 MG tablet Take 4 mg by mouth every 8 (eight) hours as needed for nausea or vomiting.    [provider]  oxyCODONE -acetaminophen  (PERCOCET/ROXICET) 5-325 MG tablet Take 1 tablet by mouth 3 (three) times daily.    [provider]  predniSONE  (DELTASONE ) 5 MG tablet Take 15 mg by mouth daily. 06/24/15   [provider]  sodium zirconium cyclosilicate  (LOKELMA ) 10 g PACK packet Take 10 g by mouth daily.    [provider]  XPHOZAH 30 MG TABS Take 1 tablet by mouth 2 (two) times daily. 06/21/23   [provider]  zolpidem  (AMBIEN ) 5 MG  tablet Take 5 mg by mouth at bedtime as needed for sleep.    [provider]     Critical care time: 78 min      CRITICAL CARE Performed by: Delories Fetter   Total critical care time: 78 minutes  Critical care time was exclusive of separately billable procedures and treating other patients.  Critical care was necessary to treat or prevent imminent or life-threatening deterioration.  Critical care was time spent personally by me on the following activities: development of treatment plan with patient and/or surrogate as well as nursing, discussions with consultants, evaluation of patient's response to treatment, examination of patient, obtaining history from patient or surrogate, ordering and performing treatments and interventions, ordering and review of laboratory studies, ordering and review of radiographic studies, pulse oximetry and re-evaluation of patient's condition.  Eston Hence MSN, AGACNP-BC [place] [facility] Amion for pager  02/14/2024, 2:41  PM

## 2024-02-14 NOTE — Progress Notes (Signed)
 Pt transported to/from CT on the vent with RN X 2 without incident.

## 2024-02-14 NOTE — Plan of Care (Addendum)
  [name] [name] was able to reach the pt brother, who is in [place] and does not have reliable phone contact and can really only be reached via whatsapp, facebook, etc.  He [place].   Updated brother on clinical course and current plan. Guarded prognosis. All questions answered. Understandably in shock. He is going to work on trying to contact parents.   ----  Called and updated listed contact, friend Retail buyer.  Her family lives in [place] and he said he is really all she has got here, but did not elaborate on their relationship further.   Updated on her critical status and interval change. He is going to try to get contact information for her family. Nephrology team working to do the same.    Eston Hence MSN, AGACNP-BC [facility]/Critical Care Medicine 02/14/2024, 2:54 PM

## 2024-02-14 NOTE — Consult Note (Addendum)
 [facility] Renal Consultation Note    Indication for Consultation:  Management of ESRD/hemodialysis; anemia, hypertension/volume and secondary hyperparathyroidism = Nephrologist: Dr. Zelda Hickman  HPI: [name] [name] is a 43 y.o. female on dialysis MWF at [facility]. 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  Last HD  02/13/2024 ran 2 hours and signed off early. H/O high IDWG, truncated treatments. Rarely stays longer than 3 hours.  Patient presented to ED 02/13/2024 with C/O nausea/vomiting post HD. Upon arrival to ED, she was noted to be extremely hypertensive with low O2 sats. CT of abdomen revealed increasingly thickened appearance in the ascending, descendingand sigmoid colon concerning for colitis. No pneumatosis is seen. New small layering pleural effusions, interlobular septal lung base edema, and increasing patchy haziness throughout the lung bases consistent with ground-glass edema or pneumonitis.Cholelithiasis. K+ 5.8 on arrival to ED. She was given fentanyl , IVF 500cc, Lokelma  10 grams PO in ED. Repeat K+ 6.1 this AM. She is now waking up after narcan . Was altered, yelling and screaming in ED.    Seen in room. Earlier agitated and crying in ED, but now obtunded, difficult to arouse. Noted slight R eye nystagmus now gaze is convergent otherwise pupils equal sluggish to light. BP 230/76. She rec'd labetalol  earlier for HTN without response.Currently high flow Hustler 100% o2, Last Po2 82 PCO 40 pH 7.47. Has had 100 mcg Fentanyl  and  doses of diluadid. Have called rapid response. Giving hydralazine  10 mg IV now. Going to try narcan . Notified primary. She awoke slightly after narcan  but still not following commands. PCM at bedside. Code stroke called.   Past Medical History:  Diagnosis Date   Anemia     Anxiety    DM (diabetes mellitus), type 1 (HCC)    ESRD on hemodialysis (HCC)    Gastroparesis    Hypertension    Narcotic abuse (HCC)    Non compliance with medical treatment    Vision loss    Past Surgical History:  Procedure Laterality Date   A/V FISTULAGRAM Right 02/17/2021   Procedure: A/V FISTULAGRAM;  Surgeon: Young Hensen, MD;  Location: [facility] INVASIVE CV LAB;  Service: Cardiovascular;  Laterality: Right;   AV FISTULA PLACEMENT  11/17/2011   Procedure: ARTERIOVENOUS (AV) FISTULA CREATION;  Surgeon: Mayo Speck, MD;  Location: [facility] OR;  Service: Vascular;  Laterality: Right;   BIOPSY  12/03/2021   Procedure: BIOPSY;  Surgeon: Albertina Hugger, MD;  Location: [facility] ENDOSCOPY;  Service: Gastroenterology;;   ESOPHAGOGASTRODUODENOSCOPY (EGD) WITH PROPOFOL  N/A 12/03/2021   Procedure: ESOPHAGOGASTRODUODENOSCOPY (EGD) WITH PROPOFOL ;  Surgeon: Albertina Hugger, MD;  Location: [facility] ENDOSCOPY;  Service: Gastroenterology;  Laterality: N/A;   EYE SURGERY     HEMATOMA EVACUATION Right 02/25/2021   Procedure: EVACUATION HEMATOMA RIGHT CHEST;  Surgeon: Adine Hoof, MD;  Location: [facility] OR;  Service: Vascular;  Laterality: Right;   INSERTION OF DIALYSIS CATHETER Right 12/08/2020   Procedure: INSERTION OF TUNNELED DIALYSIS CATHETER;  Surgeon: Young Hensen, MD;  Location: [facility] OR;  Service: Vascular;  Laterality: Right;   IR REMOVAL TUN CV CATH W/O FL  05/03/2021   KIDNEY TRANSPLANT     NEPHRECTOMY TRANSPLANTED ORGAN     pancrease transplant     PERIPHERAL VASCULAR BALLOON ANGIOPLASTY Right 02/17/2021   Procedure: PERIPHERAL VASCULAR BALLOON ANGIOPLASTY;  Surgeon: Young Hensen, MD;  Location: [facility] INVASIVE CV LAB;  Service: Cardiovascular;  Laterality: Right;   REFRACTIVE SURGERY     REVISON OF ARTERIOVENOUS FISTULA Right 12/08/2020   Procedure: RIGHT ARM ARTERIOVENOUS FISTULA REVISION AND RESECTION;  Surgeon: Young Hensen, MD;  Location: [facility] OR;  Service: Vascular;   Laterality: Right;   REVISON OF ARTERIOVENOUS FISTULA Right 02/25/2021   Procedure: RIGHT ARM ARTERIOVENOUS FISTULA REVISION WITH TRANSPOSITION OF CEPHALIC VEIN ON AXILLARY VEIN;  Surgeon: Young Hensen, MD;  Location: [facility] OR;  Service: Vascular;  Laterality: Right;   Family History  Problem Relation Age of Onset   Hypertension Father    Social History:  reports that she has quit smoking. Her smoking use included pipe. She has never used smokeless tobacco. She reports that she does not drink alcohol  and does not use drugs. No Known Allergies Prior to Admission medications   Medication Sig Start Date End Date Taking? Authorizing Provider  amLODipine  (NORVASC ) 10 MG tablet Take 1 tablet (10 mg total) by mouth daily. 02/05/22 11/06/23  Hoyt Macleod, MD  aspirin  EC 81 MG tablet Chew 81 mg by mouth daily.    [provider]  busPIRone  (BUSPAR ) 10 MG tablet Take 10 mg by mouth 2 (two) times daily. 10/25/23   [provider]  carvedilol  (COREG ) 12.5 MG tablet Take 1 tablet (12.5 mg total) by mouth 2 (two) times daily with a meal. Patient not taking: Reported on 11/06/2023 02/05/22 11/06/23  Hoyt Macleod, MD  cinacalcet  (SENSIPAR ) 30 MG tablet Take 3 tablets (90 mg total) by mouth daily with supper. 06/14/23   Luna Salinas, MD  cycloSPORINE  modified (NEORAL ) 100 MG capsule Take 2 capsules (200 mg total) by mouth 2 (two) times daily. 12/25/21   Pokhrel, Laxman, MD  cycloSPORINE  modified (NEORAL ) 25 MG capsule Take 25 mg by mouth daily.    [provider]  Darbepoetin Alfa  (ARANESP ) 60 MCG/0.3ML SOSY injection Inject 0.3 mLs (60 mcg total) into the vein every Friday with hemodialysis. 11/18/21   Abbe Abate, MD  doxercalciferol  (HECTOROL ) 4 MCG/2ML injection Inject 3 mLs (6 mcg total) into the vein every Monday, Wednesday, and Friday with hemodialysis. 11/17/21   Abbe Abate, MD  DULoxetine  (CYMBALTA ) 20 MG capsule Take 1 capsule (20 mg total) by mouth daily. 02/06/22  11/06/23  Hoyt Macleod, MD  ferric citrate  (AURYXIA ) 1 GM 210 MG(Fe) tablet Take 210-630 mg by mouth See admin instructions. Take 3 tablets (630 mg) by mouth three times daily with meals, and take 3 tablets (630 mg) with snacks    [provider]  gabapentin  (NEURONTIN ) 100 MG capsule Take 100 mg by mouth 2 (two) times daily. 10/25/23   [provider]  hydrALAZINE  (APRESOLINE ) 25 MG tablet Take 3 tablets (75 mg total) by mouth every 8 (eight) hours. 02/05/22 11/06/23  Hoyt Macleod, MD  hydrOXYzine  (VISTARIL ) 25 MG capsule Take 25 mg by mouth 3 (three) times daily as needed for anxiety. 05/26/23   [provider]  lidocaine -prilocaine  (EMLA ) cream Apply 1 application  topically daily as needed (prior to port access). 07/09/20   [provider]  losartan  (COZAAR ) 100 MG tablet Take 1 tablet (100 mg total) by mouth daily. 12/04/21 11/06/23  [place], Rema Care, DO  MELATONIN PO Take 1 tablet by mouth at bedtime.    [provider]  metoCLOPramide  (REGLAN ) 5 MG tablet Take 5 mg by mouth every 6 (six) hours as needed for nausea or vomiting.    [provider]  mycophenolate  (MYFORTIC ) 180 MG EC tablet Take 540 mg by mouth 2 (two) times daily.    [provider]  ondansetron  (ZOFRAN ) 4 MG tablet Take 4 mg by mouth every 8 (eight) hours as needed for nausea or vomiting.    [provider]  oxyCODONE -acetaminophen  (PERCOCET/ROXICET) 5-325 MG tablet Take 1 tablet by mouth 3 (three) times daily.    [provider]  predniSONE  (DELTASONE ) 5 MG tablet Take 15 mg by mouth daily. 06/24/15   [provider]  sodium zirconium cyclosilicate  (LOKELMA ) 10 g PACK packet Take 10 g by mouth daily.    [provider]  XPHOZAH 30 MG TABS Take 1 tablet by mouth 2 (two) times daily. 06/21/23   [provider]  zolpidem  (AMBIEN ) 5 MG tablet Take 5 mg by mouth at bedtime as needed for sleep.    [provider]   Current  Facility-Administered Medications  Medication Dose Route Frequency Provider Last Rate Last Admin   acetaminophen  (TYLENOL ) tablet 650 mg  650 mg Oral Q6H PRN Smith, Rondell A, MD       Or   acetaminophen  (TYLENOL ) suppository 650 mg  650 mg Rectal Q6H PRN Manny Sees A, MD       albuterol  (PROVENTIL ) (2.5 MG/3ML) 0.083% nebulizer solution 2.5 mg  2.5 mg Nebulization Q4H PRN Felipe Horton, Rondell A, MD       cefTRIAXone  (ROCEPHIN ) 2 g in sodium chloride  0.9 % 100 mL IVPB  2 g Intravenous Q24H Smith, Rondell A, MD 200 mL/hr at 02/14/24 0926 2 g at 02/14/24 0926   [START ON 02/15/2024] Chlorhexidine  Gluconate Cloth 2 % PADS 6 each  6 each Topical Q0600 Raliegh Burgess, NP       doxercalciferol  (HECTOROL ) injection 4 mcg  4 mcg Intravenous Q M,W,F-HD Raliegh Burgess, NP       hydrALAZINE  (APRESOLINE ) injection 10 mg  10 mg Intravenous Q4H PRN Smith, Rondell A, MD   10 mg at 02/14/24 1102   HYDROmorphone  (DILAUDID ) injection 0.5-1 mg  0.5-1 mg Intravenous Q3H PRN Opyd, Timothy S, MD   1 mg at 02/14/24 0330   labetalol  (NORMODYNE ) injection 10 mg  10 mg Intravenous Q2H PRN Opyd, Santana Cue, MD   10 mg at 02/14/24 1036   metroNIDAZOLE  (FLAGYL ) IVPB 500 mg  500 mg Intravenous Q12H Smith, Rondell A, MD 100 mL/hr at 02/14/24 0936 500 mg at 02/14/24 0936   sodium chloride  flush (NS) 0.9 % injection 3 mL  3 mL Intravenous Q12H Felipe Horton, Rondell A, MD   3 mL at 02/14/24 1610   trimethobenzamide (TIGAN) injection 200 mg  200 mg Intramuscular Q6H PRN Walton Guppy, MD   200 mg at 02/14/24 1036   Labs: Basic Metabolic Panel: Recent Labs  Lab 02/13/24 2109 02/14/24 0949  NA 136 139  K 5.8* 6.1*  CL 93* 97*  CO2 24 25  GLUCOSE 122* 130*  BUN 28* 36*  CREATININE 5.38* 6.33*  CALCIUM  9.6 9.6  PHOS  --  6.1*   Liver Function Tests: Recent Labs  Lab 02/13/24 2109 02/14/24 0949  AST 16  --   ALT 11  --   ALKPHOS 92  --   BILITOT 0.8  --   PROT 7.6  --   ALBUMIN 3.9 3.7   Recent Labs  Lab  02/13/24 2109  LIPASE 35   No results for input(s): AMMONIA in the last 168 hours. CBC: Recent Labs  Lab 02/13/24 2109  WBC 4.4  HGB 11.9*  HCT 37.3  MCV 94.7  PLT 91*   Cardiac Enzymes: No results for input(s): CKTOTAL, CKMB, CKMBINDEX, TROPONINI in the last 168 hours. CBG: Recent Labs  Lab 02/14/24 0911 02/14/24 1011  GLUCAP 141* 144*   Iron  Studies: No results for input(s): IRON , TIBC, TRANSFERRIN, FERRITIN in the last 72 hours. Studies/Results: CT ABDOMEN PELVIS WO CONTRAST Result Date: 02/14/2024 CLINICAL DATA:  Abdominal pain, nausea, and vomiting after dialysis. EXAM: CT ABDOMEN AND PELVIS WITHOUT CONTRAST TECHNIQUE: Multidetector CT imaging of the abdomen and pelvis was performed following the standard protocol without IV contrast. RADIATION DOSE REDUCTION: This exam was performed according to the departmental dose-optimization program which includes automated exposure control, adjustment of the mA and/or kV according to patient size and/or use of iterative reconstruction technique. COMPARISON:  CTs without contrast 11/05/2023 and 09/26/2023. FINDINGS: Lower chest: There are new small layering pleural effusions, interval developed interlobular septal edema, and increasing patchy haziness throughout the lung bases consistent with ground-glass edema or pneumonitis. Stable cardiomegaly with small but circumferential pericardial effusion. There is multivessel coronary atherosclerosis. Small hiatal hernia. Hepatobiliary: There is loss of fine detail due to respiratory motion and due to streak artifact from overlying wires. No liver mass is seen without contrast. The liver is enlarged 22.5 cm length. There are few tiny stones in the posterior gallbladder without wall thickening or bile duct dilatation. Pancreas: No abnormality is seen through the motion and streak artifacts. Although not well seen there appears to be a transplanted pancreas in the left upper abdomen,  with a pancreaticojejunostomy ventrally. No inflammatory changes seen or ductal dilatation. Spleen: No focal abnormality is seen through the motion and streak artifacts. The spleen is enlarged measuring 15 cm in length, as before. Adrenals/Urinary Tract: No adrenal mass. Severely atrophic native kidneys with heavy renovascular calcifications. 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 There are small stable cortical cysts. The bladder is contracted and unchanged in appearance. Stomach/Bowel: Unremarkable contracted stomach. Normal caliber in the small bowel except for mild chronic dilatation at the pancreaticojejunostomy site anterior mid abdomen. There is an increasingly thickened appearance in the ascending, descending and sigmoid colon concerning for colitis. There are stranding changes over portions of this. No pneumatosis is seen. The appendix is normal caliber. Vascular/Lymphatic: No enlarged lymph nodes are seen. There are extensive heavy aortic and branch vessel atherosclerotic calcific plaques. No AAA. Reproductive: 4 cm partially calcified dorsal uterine fibroid to the right. Uterus and adnexal areas are otherwise unremarkable except for extensive vascular calcifications in the uterine wall. Other: No free fluid or free air. No abscess or localizing collection. Musculoskeletal: Hemangioma T9 vertebral body. Renal osteodystrophy. No acute or other significant osseous process. IMPRESSION: 1. Increasingly thickened appearance in the ascending, descending and sigmoid colon concerning for colitis. No pneumatosis is seen. 2. New small layering pleural effusions, interlobular septal lung base edema, and increasing patchy haziness throughout the lung bases consistent with ground-glass edema or pneumonitis. 3. Stable cardiomegaly with small but circumferential pericardial effusion. 4. Cholelithiasis. 5. Hepatosplenomegaly. 6.  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 7. Extensive heavy aortic and branch vessel atherosclerosis. 8. 4 cm partially calcified dorsal uterine fibroid to the right. Aortic Atherosclerosis (ICD10-I70.0). Electronically Signed   By: Denman Fischer M.D.   On: 02/14/2024 00:20    ROS: As per HPI otherwise negative.  Physical Exam: Vitals:   02/14/24 1040  02/14/24 1043 02/14/24 1100 02/14/24 1200  BP: (!) 246/78  (!) 221/79 (!) 230/76  Pulse: 87  83   Resp: (!) 33 (!) 36 (!) 29 (!) 39  Temp: 98 F (36.7 C)  99.2 F (37.3 C)   TempSrc: Axillary  Axillary   SpO2: 97% 95%    Weight:      Height:         General: Chronically ill appearing female currently obtundeed. Head: Normocephalic, atraumatic, sclera non-icteric, mucus membranes are moist Neck: Supple. JVD not elevated. Lungs: CTAB decreased in bases with WOB present.  Heart: RRR with S1 S2. No murmurs, rubs, or gallops appreciated. Abdomen: Soft, non-tender, non-distended with normoactive bowel sounds. No rebound/guarding. No obvious abdominal masses. M-S:  Strength and tone appear normal for age. Lower extremities:without edema or ischemic changes, no open wounds  Neuro: Alert and oriented X 3. Moves all extremities spontaneously. Psych:  Responds to questions appropriately with a normal affect. Dialysis Access:   Dialysis Orders: [facility] MWF 3:45 hrs 180NRe 400/500 57.2 kg 2.0 K/ 2.0 Ca AVF - No heparin  - Hectorol  4 mcg IV three times per week - Mircera 150 mcg IV q 2 weeks (last dose 02/11/2024 next dose due 02/25/2024)    Assessment/Plan:  Altered mental status: Code stroke called. W/U per PCCM  Acute hypoxic respiratory failure-New pleural effusions/ground glass opacity throughout lung bases noted on CT Ab/pelvis. Concern for volume overload. She has left above OP EDW last 3 treatments. Will attempt to lower volume and optimize volume status  with HD.   Hyperkalemia- urgent HD off schedule today. Follow labs. Continue Lokelma  10 gram PO daily when eating.   ESRD - MWF HD off schedule today and again 02/15/2024 to resume MWF Schedule.   Hypertension/volume  - Chronic difficult to manage HTN. Resume home meds. Lower volume as noted #2  Anemia  - HGB stable. Recent ESA. Follow labs.   Metabolic bone disease -  Chronic noncompliance with binders. Resume BMD meds/   Nutrition - Albumin OK. 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 Continues on immunosuppressant [place] for pancreas. Followed by Estanislado Height. Bevin Bucks, NP-C 02/14/2024, 12:15 PM  [employer] [phone]

## 2024-02-14 NOTE — ED Notes (Signed)
 Floor notified that Pt is being transported.

## 2024-02-15 ENCOUNTER — Inpatient Hospital Stay (HOSPITAL_COMMUNITY)

## 2024-02-15 DIAGNOSIS — R1310 Dysphagia, unspecified: Secondary | ICD-10-CM

## 2024-02-15 DIAGNOSIS — I615 Nontraumatic intracerebral hemorrhage, intraventricular: Secondary | ICD-10-CM | POA: Diagnosis not present

## 2024-02-15 DIAGNOSIS — I613 Nontraumatic intracerebral hemorrhage in brain stem: Secondary | ICD-10-CM | POA: Diagnosis not present

## 2024-02-15 DIAGNOSIS — G911 Obstructive hydrocephalus: Secondary | ICD-10-CM | POA: Diagnosis not present

## 2024-02-15 DIAGNOSIS — I609 Nontraumatic subarachnoid hemorrhage, unspecified: Secondary | ICD-10-CM | POA: Diagnosis not present

## 2024-02-15 DIAGNOSIS — R112 Nausea with vomiting, unspecified: Secondary | ICD-10-CM | POA: Diagnosis not present

## 2024-02-15 DIAGNOSIS — G936 Cerebral edema: Secondary | ICD-10-CM

## 2024-02-15 DIAGNOSIS — E440 Moderate protein-calorie malnutrition: Secondary | ICD-10-CM | POA: Insufficient documentation

## 2024-02-15 DIAGNOSIS — E109 Type 1 diabetes mellitus without complications: Secondary | ICD-10-CM | POA: Diagnosis not present

## 2024-02-15 DIAGNOSIS — I160 Hypertensive urgency: Secondary | ICD-10-CM

## 2024-02-15 DIAGNOSIS — E1022 Type 1 diabetes mellitus with diabetic chronic kidney disease: Secondary | ICD-10-CM

## 2024-02-15 DIAGNOSIS — J9690 Respiratory failure, unspecified, unspecified whether with hypoxia or hypercapnia: Secondary | ICD-10-CM

## 2024-02-15 DIAGNOSIS — G935 Compression of brain: Secondary | ICD-10-CM

## 2024-02-15 DIAGNOSIS — J810 Acute pulmonary edema: Secondary | ICD-10-CM | POA: Diagnosis not present

## 2024-02-15 LAB — TRIGLYCERIDES: Triglycerides: 110 mg/dL (ref <150)

## 2024-02-15 LAB — RENAL FUNCTION PANEL
Albumin: 3 g/dL — ABNORMAL LOW (ref 3.5–5.0)
Albumin: 3 g/dL — ABNORMAL LOW (ref 3.5–5.0)
Anion gap: 13 (ref 5–15)
Anion gap: 11 (ref 5–15)
BUN: 28 mg/dL — ABNORMAL HIGH (ref 6–20)
BUN: 16 mg/dL (ref 6–20)
CO2: 25 mmol/L (ref 22–32)
CO2: 24 mmol/L (ref 22–32)
Calcium: 9.4 mg/dL (ref 8.9–10.3)
Calcium: 9.4 mg/dL (ref 8.9–10.3)
Chloride: 101 mmol/L (ref 98–111)
Chloride: 101 mmol/L (ref 98–111)
Creatinine, Ser: 4.87 mg/dL — ABNORMAL HIGH (ref 0.44–1.00)
Creatinine, Ser: 3.15 mg/dL — ABNORMAL HIGH (ref 0.44–1.00)
GFR, Estimated: 11 mL/min — ABNORMAL LOW (ref >60)
GFR, Estimated: 18 mL/min — ABNORMAL LOW (ref >60)
Glucose, Bld: 86 mg/dL (ref 70–99)
Glucose, Bld: 97 mg/dL (ref 70–99)
Phosphorus: 4.6 mg/dL (ref 2.5–4.6)
Phosphorus: 2.9 mg/dL (ref 2.5–4.6)
Potassium: 4.5 mmol/L (ref 3.5–5.1)
Potassium: 4.3 mmol/L (ref 3.5–5.1)
Sodium: 139 mmol/L (ref 135–145)
Sodium: 136 mmol/L (ref 135–145)

## 2024-02-15 LAB — BASIC METABOLIC PANEL WITH GFR
Anion gap: 14 (ref 5–15)
BUN: 28 mg/dL — ABNORMAL HIGH (ref 6–20)
CO2: 25 mmol/L (ref 22–32)
Calcium: 9.4 mg/dL (ref 8.9–10.3)
Chloride: 101 mmol/L (ref 98–111)
Creatinine, Ser: 4.95 mg/dL — ABNORMAL HIGH (ref 0.44–1.00)
GFR, Estimated: 11 mL/min — ABNORMAL LOW (ref >60)
Glucose, Bld: 86 mg/dL (ref 70–99)
Potassium: 4.6 mmol/L (ref 3.5–5.1)
Sodium: 140 mmol/L (ref 135–145)

## 2024-02-15 LAB — CBC
HCT: 30.8 % — ABNORMAL LOW (ref 36.0–46.0)
Hemoglobin: 9.6 g/dL — ABNORMAL LOW (ref 12.0–15.0)
MCH: 30.3 pg (ref 26.0–34.0)
MCHC: 31.2 g/dL (ref 30.0–36.0)
MCV: 97.2 fL (ref 80.0–100.0)
Platelets: 119 10*3/uL — ABNORMAL LOW (ref 150–400)
RBC: 3.17 MIL/uL — ABNORMAL LOW (ref 3.87–5.11)
RDW: 13.3 % (ref 11.5–15.5)
WBC: 7.3 10*3/uL (ref 4.0–10.5)
nRBC: 0 % (ref 0.0–0.2)

## 2024-02-15 LAB — LIPID PANEL
Cholesterol: 122 mg/dL (ref 0–200)
HDL: 37 mg/dL — ABNORMAL LOW (ref >40)
LDL Cholesterol: 63 mg/dL (ref 0–99)
Total CHOL/HDL Ratio: 3.3 ratio
Triglycerides: 111 mg/dL (ref <150)
VLDL: 22 mg/dL (ref 0–40)

## 2024-02-15 LAB — ECHOCARDIOGRAM COMPLETE
Area-P 1/2: 3.68 cm2
Calc EF: 66 %
Height: 67.5 in
S' Lateral: 2.6 cm
Single Plane A2C EF: 66.3 %
Single Plane A4C EF: 64.4 %
Weight: 1971.79 [oz_av]

## 2024-02-15 LAB — HEPATITIS B SURFACE ANTIBODY, QUANTITATIVE: Hep B S AB Quant (Post): 176 m[IU]/mL

## 2024-02-15 LAB — GLUCOSE, CAPILLARY
Glucose-Capillary: 79 mg/dL (ref 70–99)
Glucose-Capillary: 75 mg/dL (ref 70–99)
Glucose-Capillary: 94 mg/dL (ref 70–99)
Glucose-Capillary: 92 mg/dL (ref 70–99)
Glucose-Capillary: 98 mg/dL (ref 70–99)
Glucose-Capillary: 104 mg/dL — ABNORMAL HIGH (ref 70–99)

## 2024-02-15 LAB — MAGNESIUM: Magnesium: 2.4 mg/dL (ref 1.7–2.4)

## 2024-02-15 MED ORDER — PREDNISONE 5 MG PO TABS
5.0000 mg | ORAL_TABLET | Freq: Every day | ORAL | Status: DC
  Administered 2024-02-15: 5 mg
  Filled 2024-02-15 (×2): qty 1

## 2024-02-15 MED ORDER — PREDNISONE 5 MG PO TABS
15.0000 mg | ORAL_TABLET | Freq: Every day | ORAL | Status: DC
  Administered 2024-02-16 – 2024-03-12 (×26): 15 mg
  Filled 2024-02-15 (×3): qty 1
  Filled 2024-02-15: qty 3
  Filled 2024-02-15 (×2): qty 1
  Filled 2024-02-15: qty 3
  Filled 2024-02-15: qty 1
  Filled 2024-02-15: qty 3
  Filled 2024-02-15 (×3): qty 1
  Filled 2024-02-15: qty 3
  Filled 2024-02-15 (×4): qty 1
  Filled 2024-02-15: qty 3
  Filled 2024-02-15 (×4): qty 1
  Filled 2024-02-15: qty 3
  Filled 2024-02-15: qty 1
  Filled 2024-02-15: qty 3
  Filled 2024-02-15 (×3): qty 1

## 2024-02-15 MED ORDER — CARVEDILOL 12.5 MG PO TABS
25.0000 mg | ORAL_TABLET | Freq: Two times a day (BID) | ORAL | Status: DC
  Filled 2024-02-15: qty 2

## 2024-02-15 MED ORDER — OSMOLITE 1.5 CAL PO LIQD
1000.0000 mL | ORAL | Status: DC
  Administered 2024-02-15 – 2024-02-21 (×6): 1000 mL

## 2024-02-15 MED ORDER — MYCOPHENOLATE 200 MG/ML ORAL SUSPENSION
500.0000 mg | Freq: Two times a day (BID) | ORAL | Status: DC
  Filled 2024-02-15: qty 2.5

## 2024-02-15 MED ORDER — AMLODIPINE BESYLATE 10 MG PO TABS
10.0000 mg | ORAL_TABLET | Freq: Every day | ORAL | Status: DC
  Administered 2024-02-15 – 2024-02-21 (×6): 10 mg
  Filled 2024-02-15 (×7): qty 1

## 2024-02-15 MED ORDER — CARVEDILOL 12.5 MG PO TABS
25.0000 mg | ORAL_TABLET | Freq: Two times a day (BID) | ORAL | Status: DC
  Administered 2024-02-15 – 2024-02-17 (×4): 25 mg
  Filled 2024-02-15 (×4): qty 2

## 2024-02-15 MED ORDER — PROSOURCE TF20 ENFIT COMPATIBL EN LIQD
60.0000 mL | Freq: Two times a day (BID) | ENTERAL | Status: DC
  Administered 2024-02-15 – 2024-02-18 (×6): 60 mL
  Filled 2024-02-15 (×6): qty 60

## 2024-02-15 MED ORDER — MYCOPHENOLATE 200 MG/ML ORAL SUSPENSION
500.0000 mg | Freq: Two times a day (BID) | ORAL | Status: DC
  Administered 2024-02-15 – 2024-03-12 (×50): 500 mg
  Filled 2024-02-15 (×57): qty 2.5

## 2024-02-15 MED ORDER — OXYCODONE HCL 5 MG PO TABS
5.0000 mg | ORAL_TABLET | Freq: Four times a day (QID) | ORAL | Status: DC
  Administered 2024-02-15 – 2024-02-18 (×12): 5 mg
  Filled 2024-02-15 (×12): qty 1

## 2024-02-15 MED ORDER — CYCLOSPORINE MODIFIED (NEORAL) 100 MG/ML PO SOLN
25.0000 mg | Freq: Every day | ORAL | Status: DC
  Administered 2024-02-15 – 2024-02-26 (×12): 25 mg
  Filled 2024-02-15 (×12): qty 0.3

## 2024-02-15 MED ORDER — CARVEDILOL 12.5 MG PO TABS: 25.0000 mg | ORAL_TABLET | Freq: Two times a day (BID) | ORAL | Status: DC

## 2024-02-15 NOTE — Progress Notes (Signed)
 PT Cancellation Note  Patient Details Name: [name] [name] MRN: [id number] DOB: 1981-04-29   Cancelled Treatment:    Reason Eval/Treat Not Completed: Medical issues which prohibited therapy; patient mechanically vented and sedated.  Will follow up when medically stable and able to participate.    Marley Simmers 02/15/2024, 9:30 AM Abigail Hoff, PT Acute Rehabilitation Services [phone] 02/15/2024

## 2024-02-15 NOTE — Progress Notes (Signed)
 OT Cancellation Note  Patient Details Name: [name] [name] MRN: [id number] DOB: Oct 27, 1980   Cancelled Treatment:    Reason Eval/Treat Not Completed: Active bedrest order;Patient not medically ready (On vent/sedated.  Will assess when medically appropriate.)  Janequa Kipnis,HILLARY 02/15/2024, 5:55 AM Milburn Aliment, OT/L   Acute OT Clinical Specialist Acute Rehabilitation Services Pager [phone] Office [phone]

## 2024-02-15 NOTE — Progress Notes (Addendum)
 Patient ID: [name] [name], female   DOB: 1981-06-22, 43 y.o.   MRN: [id number] Subjective: The patient is sedated with propofol , Cleviprex  and fentanyl .  She is in no apparent distress.  She is getting dialyzed.  Objective: Vital signs in last 24 hours: Temp:  [98 F (36.7 C)-99.2 F (37.3 C)] 98.4 F (36.9 C) (06/13 0400) Pulse Rate:  [67-113] 79 (06/13 0600) Resp:  [7-39] 14 (06/13 0600) BP: (115-255)/(53-148) 121/62 (06/13 0600) SpO2:  [89 %-100 %] 100 % (06/13 0600) Arterial Line BP: (122-167)/(49-65) 138/54 (06/13 0600) FiO2 (%):  [50 %-100 %] 50 % (06/13 0428) Weight:  [55.9 kg] 55.9 kg (06/13 0400) Estimated body mass index is 19.02 kg/m as calculated from the following:   Height as of this encounter: 5' 7.5 (1.715 m).   Weight as of this encounter: 55.9 kg.   Intake/Output from previous day: 06/12 0701 - 06/13 0700 In: 1345.3 [I.V.:463.5; NG/GT:65; IV Piggyback:799.7] Out: 1070.7 [Drains:149] Intake/Output this shift: Total I/O In: 931.6 [I.V.:324.4; Other:17; NG/GT:65; IV Piggyback:525.2] Out: 1038.7 [Drains:117]  Physical exam the patient is sedated as above.  Her pupils are miotic and equal.  By report she is following commands with wake-up assessment.  Her ventriculostomy is sluggish but draining about 10 to 15 cc/h.  Her follow-up head CT demonstrates persistent ventriculomegaly.  The ventriculostomy is well-positioned.  Lab Results: Recent Labs    02/14/24 1741 02/15/24 0430  WBC 9.8 7.3  HGB 10.5* 9.6*  HCT 32.9* 30.8*  PLT 116*  109* 119*   BMET Recent Labs    02/14/24 1741 02/15/24 0430  NA 139 140  139  K 5.4* 4.6  4.5  CL 96* 101  101  CO2 26 25  25   GLUCOSE 128* 86  86  BUN 39* 28*  28*  CREATININE 6.89* 4.95*  4.87*  CALCIUM  9.2 9.4  9.4    Studies/Results: DG Abd Portable 1V Result Date: 02/14/2024 CLINICAL DATA:  NG tube placement EXAM: PORTABLE ABDOMEN - 1 VIEW COMPARISON:  11/05/2023 FINDINGS: Enteric tube tip and  side port overlie proximal stomach. Possible partial kink in the tubing at the level of the side port. Nonobstructed gas pattern IMPRESSION: Enteric tube tip and side port overlie proximal stomach. Possible partial kink in the tubing at the level of the side port. Electronically Signed   By: Esmeralda Hedge M.D.   On: 02/14/2024 23:18   CT HEAD POST STROKE FOLLOWUP/TIMED/STAT [name] Result Date: 02/14/2024 CLINICAL DATA:  Follow-up examination for neuro deficit, stroke suspected. EXAM: CT HEAD WITHOUT CONTRAST TECHNIQUE: Contiguous axial images were obtained from the base of the skull through the vertex without intravenous contrast. RADIATION DOSE REDUCTION: This exam was performed according to the departmental dose-optimization program which includes automated exposure control, adjustment of the mA and/or kV according to patient size and/or use of iterative reconstruction technique. COMPARISON:  Comparison made with CTs from earlier the same day. FINDINGS: Brain: IV contrast material on board related to prior CTA, mildly limiting assessment. Large volume clot/hemorrhage involving the prepontine cistern with extension towards the foramen magnum is relatively similar to prior. Scattered subarachnoid blood along the left greater than right sylvian fissures. Intraventricular hemorrhage with blood seen layering in the occipital horns of both lateral ventricles. Overall, degree of hemorrhage is similar to prior. Interval placement of a right frontal approach ventriculostomy with tip in the body of the right lateral ventricle. Persistent hydrocephalus at this time, relatively similar. Chronic left cerebellar infarct noted. No visible acute large vessel  territory infarct. No new intracranial hemorrhage. No significant extra-axial collection. Vascular: IV contrast material seen throughout the intracranial vasculature. Calcified atherosclerosis present at skull base. Skull: Sequelae of right frontal approach ventriculostomy.  Sinuses/Orbits: Globes orbital soft tissues within normal limits. Paranasal sinuses are largely clear. Trace right mastoid effusion noted, of doubtful significance. Other: None. IMPRESSION: 1. Interval placement of a right frontal approach ventriculostomy with tip in the body of the right lateral ventricle. Persistent but relatively stable hydrocephalus at this time. 2. Moderate volume acute subarachnoid hemorrhage with associated IVH, similar to prior. No evidence for significant interval bleeding since prior. 3. No other new acute intracranial abnormality. Electronically Signed   By: Virgia Griffins M.D.   On: 02/14/2024 20:07   DG CHEST PORT 1 VIEW Result Date: 02/14/2024 CLINICAL DATA:  Central line placement EXAM: PORTABLE CHEST 1 VIEW COMPARISON:  March 06, 2022 FINDINGS: Tip of the endotracheal tube 1.5 cm from carina Right IJ CVP line tip in the cavoatrial junction Bilateral pulmonary infiltrates may correlate with bilateral pneumonia versus congestive changes or combination of both. No pneumothorax Heart and mediastinum normal without pleural effusions IMPRESSION: *Tip of the endotracheal tube 1.5 cm from carina. *Right IJ CVP line tip in the cavoatrial junction. *Bilateral pulmonary infiltrates may correlate with bilateral pneumonia versus congestive changes or combination of both. Electronically Signed   By: Fredrich Jefferson M.D.   On: 02/14/2024 16:19   CT ANGIO HEAD NECK W WO CM (CODE STROKE) Result Date: 02/14/2024 CLINICAL DATA:  Neuro deficit, concern for stroke, aphasia, arm tremor, mental status change. EXAM: CT ANGIOGRAPHY HEAD AND NECK WITH AND WITHOUT CONTRAST TECHNIQUE: Multidetector CT imaging of the head and neck was performed using the standard protocol during bolus administration of intravenous contrast. Multiplanar CT image reconstructions and MIPs were obtained to evaluate the vascular anatomy. Carotid stenosis measurements (when applicable) are obtained utilizing NASCET criteria,  using the distal internal carotid diameter as the denominator. RADIATION DOSE REDUCTION: This exam was performed according to the departmental dose-optimization program which includes automated exposure control, adjustment of the mA and/or kV according to patient size and/or use of iterative reconstruction technique. CONTRAST:  75mL OMNIPAQUE  IOHEXOL  350 MG/ML SOLN COMPARISON:  Same day CT head. FINDINGS: CTA NECK FINDINGS Aortic arch: Standard configuration of the aortic arch. Imaged portion shows no evidence of aneurysm or dissection. No significant stenosis of the major arch vessel origins. Pulmonary arteries: As permitted by contrast timing, there are no filling defects in the visualized pulmonary arteries. Subclavian arteries: The subclavian arteries are patent bilaterally. Right carotid system: No evidence of dissection, stenosis (50% or greater), or occlusion. Mild atherosclerosis along the proximal cervical ICA without significant stenosis. Left carotid system: No evidence of dissection, stenosis (50% or greater), or occlusion. Mild atherosclerosis at the carotid bifurcation without significant stenosis. Vertebral arteries: Codominant. No evidence of dissection, stenosis (50% or greater), or occlusion. Atherosclerosis involving the bilateral V4 segments resulting in mild stenosis. Skeleton: No acute or aggressive finding noted. Other neck: The visualized airway is patent. No cervical lymphadenopathy. Upper chest: Patchy and consolidative opacities in the bilateral upper lobes particularly within the dependent aspect as well as within the superior segment of both lower lobes. Review of the MIP images confirms the above findings CTA HEAD FINDINGS ANTERIOR CIRCULATION: The intracranial ICAs are patent bilaterally. Diffuse atherosclerosis of the bilateral carotid siphons with mild stenosis in the right supraclinoid ICA. No significant stenosis, proximal occlusion, aneurysm, or vascular malformation. MCAs: The  middle cerebral arteries are  patent bilaterally. ACAs: The anterior cerebral arteries are patent bilaterally. Focal atherosclerosis along the right A3 segment. POSTERIOR CIRCULATION: No significant stenosis, proximal occlusion, aneurysm, or vascular malformation. PCAs: The posterior cerebral arteries are patent bilaterally. Pcomm: Not well visualized. SCAs: The superior cerebellar arteries are patent bilaterally. Basilar artery: The basilar artery is patent. There is subtle irregularity of the vessel without significant narrowing. AICAs: Not well visualized. PICAs: Patent Vertebral arteries: As above. Venous sinuses: As permitted by contrast timing, patent. Anatomic variants: None Review of the MIP images confirms the above findings IMPRESSION: No large vessel occlusion. No high-grade stenosis, aneurysm, or dissection of the arteries in the head and neck. Multifocal atherosclerosis as above. Mild stenosis of the V4 segments of both vertebral arteries. Mild stenosis of the right supraclinoid ICA. Mild irregularity of the basilar artery without significant narrowing. Finding concerning for mild vasospasm. Consolidative and patchy opacities in the bilateral upper lobes and superior segments of the bilateral lower lobes. Recommend correlation with dedicated chest imaging. Impression points #1-3 were called by telephone at the time of interpretation on 02/14/2024 at 2:26 pm to provider Dr. Renaee Caro, who verbally acknowledged these results. Electronically Signed   By: Denny Flack M.D.   On: 02/14/2024 14:43   CT HEAD CODE STROKE WO CONTRAST Result Date: 02/14/2024 CLINICAL DATA:  Code stroke. Neuro deficit, concern for stroke, altered mental status and aphasia. EXAM: CT HEAD WITHOUT CONTRAST TECHNIQUE: Contiguous axial images were obtained from the base of the skull through the vertex without intravenous contrast. RADIATION DOSE REDUCTION: This exam was performed according to the departmental dose-optimization program  which includes automated exposure control, adjustment of the mA and/or kV according to patient size and/or use of iterative reconstruction technique. COMPARISON:  CT head 11/05/2023. FINDINGS: Brain: Large focus of extra-axial hemorrhage filling the prepontine cistern extending inferiorly to the foramen magnum and into the cervical spinal canal. Confluent region of hemorrhage in the prepontine cistern measures 4.1 x 2.0 cm in axial dimensions with craniocaudal extent of at least 5.2 cm to the level of the foramen magnum. There is mass effect on the brainstem. Additional subdural hemorrhage along the posterior falx and tentorial leaflets. Scattered areas of subarachnoid hemorrhage particularly along the left sylvian fissure. There are intraventricular blood products layering in both occipital horns. Encephalomalacia in the inferior medial left cerebellum concerning for remote infarct. No midline shift. Ventricles: Prominence of the ventricles concerning for developing communicating hydrocephalus. Intraventricular blood products as above. Vascular: Atherosclerotic calcifications of the carotid siphons and intracranial vertebral arteries. No hyperdense vessel. Skull: No acute or aggressive finding. Orbits: Orbits are symmetric. Sinuses: The visualized paranasal sinuses are clear. Other: Mastoid air cells are clear. ASPECTS [facility] Stroke Program Early CT Score) - Ganglionic level infarction (caudate, lentiform nuclei, internal capsule, insula, M1-M3 cortex): 7 - Supraganglionic infarction (M4-M6 cortex): 3 Total score (0-10 with 10 being normal): 10 IMPRESSION: 1. Large multi compartment intracranial hemorrhage as described above. Dominant focus of hemorrhage involving the prepontine cistern extending into the upper cervical spinal canal. 2. Intraventricular blood products layering in the occipital horns. 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 3. No midline shift. 4.  ASPECTS is 10 These results were communicated to Dr. Lindzen at 2:13 pm on 02/14/2024 by text page via the [facility] messaging system. Electronically Signed   By: Denny Flack M.D.   On: 02/14/2024 14:13   CT ABDOMEN PELVIS WO CONTRAST Result Date: 02/14/2024 CLINICAL DATA:  Abdominal pain, nausea, and vomiting after dialysis.  EXAM: CT ABDOMEN AND PELVIS WITHOUT CONTRAST TECHNIQUE: Multidetector CT imaging of the abdomen and pelvis was performed following the standard protocol without IV contrast. RADIATION DOSE REDUCTION: This exam was performed according to the departmental dose-optimization program which includes automated exposure control, adjustment of the mA and/or kV according to patient size and/or use of iterative reconstruction technique. COMPARISON:  CTs without contrast 11/05/2023 and 09/26/2023. FINDINGS: Lower chest: There are new small layering pleural effusions, interval developed interlobular septal edema, and increasing patchy haziness throughout the lung bases consistent with ground-glass edema or pneumonitis. Stable cardiomegaly with small but circumferential pericardial effusion. There is multivessel coronary atherosclerosis. Small hiatal hernia. Hepatobiliary: There is loss of fine detail due to respiratory motion and due to streak artifact from overlying wires. No liver mass is seen without contrast. The liver is enlarged 22.5 cm length. There are few tiny stones in the posterior gallbladder without wall thickening or bile duct dilatation. Pancreas: No abnormality is seen through the motion and streak artifacts. 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 No inflammatory changes seen or ductal dilatation. Spleen: No focal abnormality is seen through the motion and streak artifacts. The spleen is enlarged measuring 15 cm in length, as before. Adrenals/Urinary Tract: No adrenal mass. Severely atrophic  native kidneys with heavy renovascular calcifications. 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 There are small stable cortical cysts. The bladder is contracted and unchanged in appearance. Stomach/Bowel: Unremarkable contracted stomach. Normal caliber in the small bowel except for mild chronic dilatation at the pancreaticojejunostomy site anterior mid abdomen. There is an increasingly thickened appearance in the ascending, descending and sigmoid colon concerning for colitis. There are stranding changes over portions of this. No pneumatosis is seen. The appendix is normal caliber. Vascular/Lymphatic: No enlarged lymph nodes are seen. There are extensive heavy aortic and branch vessel atherosclerotic calcific plaques. No AAA. Reproductive: 4 cm partially calcified dorsal uterine fibroid to the right. Uterus and adnexal areas are otherwise unremarkable except for extensive vascular calcifications in the uterine wall. Other: No free fluid or free air. No abscess or localizing collection. Musculoskeletal: Hemangioma T9 vertebral body. Renal osteodystrophy. No acute or other significant osseous process. IMPRESSION: 1. Increasingly thickened appearance in the ascending, descending and sigmoid colon concerning for colitis. No pneumatosis is seen. 2. New small layering pleural effusions, interlobular septal lung base edema, and increasing patchy haziness throughout the lung bases consistent with ground-glass edema or pneumonitis. 3. Stable cardiomegaly with small but circumferential pericardial effusion. 4. Cholelithiasis. 5. Hepatosplenomegaly. 6. 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 7. Extensive heavy aortic and branch vessel atherosclerosis. 8. 4 cm partially calcified dorsal uterine fibroid to the right. Aortic Atherosclerosis (ICD10-I70.0).  Electronically Signed   By: Denman Fischer M.D.   On: 02/14/2024 00:20    Assessment/Plan: Intracranial hemorrhage, interventricular hemorrhage, hydrocephalus: We will continue the ventriculostomy throughout the weekend.  LOS: 1 day     [name] [name] 02/15/2024, 6:42 AM

## 2024-02-15 NOTE — Progress Notes (Addendum)
 STROKE TEAM PROGRESS NOTE    SIGNIFICANT HOSPITAL EVENTS 6/11- ED for nausea, vomiting after dialysis 6/12- intubated, EVD placed   INTERIM HISTORY/SUBJECTIVE Seen in ICU.  No family at the bedside.  RN is at the bedside.  She is intubated on propofol  10 mcg and fentanyl  100 mcg.  She also is on Cleviprex  at 12 Mg.  She is receiving CRRT EVD is in place and draining  OBJECTIVE  CBC    Component Value Date/Time   WBC 7.3 02/15/2024 0430   RBC 3.17 (L) 02/15/2024 0430   HGB 9.6 (L) 02/15/2024 0430   HCT 30.8 (L) 02/15/2024 0430   PLT 119 (L) 02/15/2024 0430   MCV 97.2 02/15/2024 0430   MCH 30.3 02/15/2024 0430   MCHC 31.2 02/15/2024 0430   RDW 13.3 02/15/2024 0430   LYMPHSABS 1.0 11/05/2023 0343   MONOABS 0.2 11/05/2023 0343   EOSABS 0.1 11/05/2023 0343   BASOSABS 0.0 11/05/2023 0343    BMET    Component Value Date/Time   NA 140 02/15/2024 0430   NA 139 02/15/2024 0430   K 4.6 02/15/2024 0430   K 4.5 02/15/2024 0430   CL 101 02/15/2024 0430   CL 101 02/15/2024 0430   CO2 25 02/15/2024 0430   CO2 25 02/15/2024 0430   GLUCOSE 86 02/15/2024 0430   GLUCOSE 86 02/15/2024 0430   BUN 28 (H) 02/15/2024 0430   BUN 28 (H) 02/15/2024 0430   CREATININE 4.95 (H) 02/15/2024 0430   CREATININE 4.87 (H) 02/15/2024 0430   CALCIUM  9.4 02/15/2024 0430   CALCIUM  9.4 02/15/2024 0430   GFRNONAA 11 (L) 02/15/2024 0430   GFRNONAA 11 (L) 02/15/2024 0430    IMAGING past 24 hours ECHOCARDIOGRAM COMPLETE Result Date: 02/15/2024    ECHOCARDIOGRAM REPORT   Patient Name:   [name] [name] Date of Exam: 02/15/2024 Medical Rec #:  [id number]          Height:       67.5 in Accession #:    [id number]         Weight:       123.2 lb Date of Birth:  [birth year]           BSA:          1.655 m Patient Age:    43 years           BP:           123/63 mmHg Patient Gender: F                  HR:           84 bpm. Exam Location:  Inpatient Procedure: 3D Echo, Cardiac Doppler and Color Doppler (Both Spectral  and Color            Flow Doppler were utilized during procedure). Indications:     Stroke  History:         Patient has prior history of Echocardiogram examinations. Risk                  Factors:Hypertension.  Sonographer:     Farrell Honey Key Referring Phys:  [id number] DEVON SHAFER Diagnosing Phys: Pasqual Bone MD IMPRESSIONS  1. Left ventricular ejection fraction, by estimation, is 60 to 65%. The left ventricle has normal function. The left ventricle has no regional wall motion abnormalities. Left ventricular diastolic parameters are consistent with Grade I diastolic dysfunction (impaired relaxation).  2. Right ventricular systolic function is normal. The right  ventricular size is normal.  3. Left atrial size was mildly dilated.  4. There is no evidence of cardiac tamponade.  5. The mitral valve is degenerative. Mild mitral valve regurgitation.  6. The aortic valve is tricuspid. There is mild calcification of the aortic valve. There is mild thickening of the aortic valve. Aortic valve regurgitation is not visualized. Aortic valve sclerosis/calcification is present, without any evidence of aortic stenosis.  7. There is mild (Grade II) atheroma plaque involving the aortic root and ascending aorta.  8. The inferior vena cava is normal in size with <50% respiratory variability, suggesting right atrial pressure of 8 mmHg. FINDINGS  Left Ventricle: Left ventricular ejection fraction, by estimation, is 60 to 65%. The left ventricle has normal function. The left ventricle has no regional wall motion abnormalities. The left ventricular internal cavity size was normal in size. There is  borderline concentric left ventricular hypertrophy. Left ventricular diastolic parameters are consistent with Grade I diastolic dysfunction (impaired relaxation). Right Ventricle: The right ventricular size is normal. No increase in right ventricular wall thickness. Right ventricular systolic function is normal. Left Atrium: Left atrial size was  mildly dilated. Right Atrium: Right atrial size was normal in size. Pericardium: Trivial pericardial effusion is present. The pericardial effusion is circumferential. There is no evidence of cardiac tamponade. Mitral Valve: The mitral valve is degenerative in appearance. There is moderate thickening of the mitral valve leaflet(s). There is moderate calcification of the mitral valve leaflet(s). Mild to moderate mitral annular calcification. Mild mitral valve regurgitation. Tricuspid Valve: The tricuspid valve is normal in structure. Tricuspid valve regurgitation is trivial. Aortic Valve: The aortic valve is tricuspid. There is mild calcification of the aortic valve. There is mild thickening of the aortic valve. There is mild aortic valve annular calcification. Aortic valve regurgitation is not visualized. Aortic valve sclerosis/calcification is present, without any evidence of aortic stenosis. Pulmonic Valve: The pulmonic valve was normal in structure. Pulmonic valve regurgitation is not visualized. Aorta: The aortic root is normal in size and structure. There is mild (Grade II) atheroma plaque involving the aortic root and ascending aorta. Venous: The inferior vena cava is normal in size with less than 50% respiratory variability, suggesting right atrial pressure of 8 mmHg. IAS/Shunts: The atrial septum is grossly normal.  LEFT VENTRICLE PLAX 2D LVIDd:         4.20 cm     Diastology LVIDs:         2.60 cm     LV e' medial:    5.00 cm/s LV PW:         1.00 cm     LV E/e' medial:  23.4 LV IVS:        1.00 cm     LV e' lateral:   5.55 cm/s LVOT diam:     1.40 cm     LV E/e' lateral: 21.1 LV SV:         29 LV SV Index:   17 LVOT Area:     1.54 cm  LV Volumes (MOD) LV vol d, MOD A2C: 80.5 ml LV vol d, MOD A4C: 79.2 ml LV vol s, MOD A2C: 27.1 ml LV vol s, MOD A4C: 28.2 ml LV SV MOD A2C:     53.4 ml LV SV MOD A4C:     79.2 ml LV SV MOD BP:      55.7 ml RIGHT VENTRICLE RV Basal diam:  3.00 cm RV S prime:     11.20 cm/s  TAPSE (M-mode): 2.0 cm LEFT ATRIUM             Index        RIGHT ATRIUM           Index LA diam:        2.90 cm 1.75 cm/m   RA Area:     12.40 cm LA Vol (A2C):   39.7 ml 23.98 ml/m  RA Volume:   29.40 ml  17.76 ml/m LA Vol (A4C):   22.6 ml 13.65 ml/m LA Biplane Vol: 30.0 ml 18.12 ml/m  AORTIC VALVE LVOT Vmax:   131.00 cm/s LVOT Vmean:  83.800 cm/s LVOT VTI:    0.188 m  AORTA Ao Root diam: 2.60 cm Ao Asc diam:  3.00 cm MITRAL VALVE MV Area (PHT): 3.68 cm     SHUNTS MV Decel Time: 206 msec     Systemic VTI:  0.19 m MV E velocity: 117.00 cm/s  Systemic Diam: 1.40 cm MV A velocity: 115.00 cm/s MV E/A ratio:  1.02 Pasqual Bone MD Electronically signed by Pasqual Bone MD Signature Date/Time: 02/15/2024/11:13:50 AM    Final    DG Abd Portable 1V Result Date: 02/14/2024 CLINICAL DATA:  NG tube placement EXAM: PORTABLE ABDOMEN - 1 VIEW COMPARISON:  11/05/2023 FINDINGS: Enteric tube tip and side port overlie proximal stomach. Possible partial kink in the tubing at the level of the side port. Nonobstructed gas pattern IMPRESSION: Enteric tube tip and side port overlie proximal stomach. Possible partial kink in the tubing at the level of the side port. Electronically Signed   By: Esmeralda Hedge M.D.   On: 02/14/2024 23:18   CT HEAD POST STROKE FOLLOWUP/TIMED/STAT READ Result Date: 02/14/2024 CLINICAL DATA:  Follow-up examination for neuro deficit, stroke suspected. EXAM: CT HEAD WITHOUT CONTRAST TECHNIQUE: Contiguous axial images were obtained from the base of the skull through the vertex without intravenous contrast. RADIATION DOSE REDUCTION: This exam was performed according to the departmental dose-optimization program which includes automated exposure control, adjustment of the mA and/or kV according to patient size and/or use of iterative reconstruction technique. COMPARISON:  Comparison made with CTs from earlier the same day. FINDINGS: Brain: IV contrast material on board related to prior CTA, mildly limiting  assessment. Large volume clot/hemorrhage involving the prepontine cistern with extension towards the foramen magnum is relatively similar to prior. Scattered subarachnoid blood along the left greater than right sylvian fissures. Intraventricular hemorrhage with blood seen layering in the occipital horns of both lateral ventricles. Overall, degree of hemorrhage is similar to prior. Interval placement of a right frontal approach ventriculostomy with tip in the body of the right lateral ventricle. Persistent hydrocephalus at this time, relatively similar. Chronic left cerebellar infarct noted. No visible acute large vessel territory infarct. No new intracranial hemorrhage. No significant extra-axial collection. Vascular: IV contrast material seen throughout the intracranial vasculature. Calcified atherosclerosis present at skull base. Skull: Sequelae of right frontal approach ventriculostomy. Sinuses/Orbits: Globes orbital soft tissues within normal limits. Paranasal sinuses are largely clear. Trace right mastoid effusion noted, of doubtful significance. Other: None. IMPRESSION: 1. Interval placement of a right frontal approach ventriculostomy with tip in the body of the right lateral ventricle. Persistent but relatively stable hydrocephalus at this time. 2. Moderate volume acute subarachnoid hemorrhage with associated IVH, similar to prior. No evidence for significant interval bleeding since prior. 3. No other new acute intracranial abnormality. Electronically Signed   By: Virgia Griffins M.D.   On: 02/14/2024 20:07   DG CHEST  PORT 1 VIEW Result Date: 02/14/2024 CLINICAL DATA:  Central line placement EXAM: PORTABLE CHEST 1 VIEW COMPARISON:  March 06, 2022 FINDINGS: Tip of the endotracheal tube 1.5 cm from carina Right IJ CVP line tip in the cavoatrial junction Bilateral pulmonary infiltrates may correlate with bilateral pneumonia versus congestive changes or combination of both. No pneumothorax Heart and  mediastinum normal without pleural effusions IMPRESSION: *Tip of the endotracheal tube 1.5 cm from carina. *Right IJ CVP line tip in the cavoatrial junction. *Bilateral pulmonary infiltrates may correlate with bilateral pneumonia versus congestive changes or combination of both. Electronically Signed   By: Fredrich Jefferson M.D.   On: 02/14/2024 16:19   CT ANGIO HEAD NECK W WO CM (CODE STROKE) Result Date: 02/14/2024 CLINICAL DATA:  Neuro deficit, concern for stroke, aphasia, arm tremor, mental status change. EXAM: CT ANGIOGRAPHY HEAD AND NECK WITH AND WITHOUT CONTRAST TECHNIQUE: Multidetector CT imaging of the head and neck was performed using the standard protocol during bolus administration of intravenous contrast. Multiplanar CT image reconstructions and MIPs were obtained to evaluate the vascular anatomy. Carotid stenosis measurements (when applicable) are obtained utilizing NASCET criteria, using the distal internal carotid diameter as the denominator. RADIATION DOSE REDUCTION: This exam was performed according to the departmental dose-optimization program which includes automated exposure control, adjustment of the mA and/or kV according to patient size and/or use of iterative reconstruction technique. CONTRAST:  75mL OMNIPAQUE  IOHEXOL  350 MG/ML SOLN COMPARISON:  Same day CT head. FINDINGS: CTA NECK FINDINGS Aortic arch: Standard configuration of the aortic arch. Imaged portion shows no evidence of aneurysm or dissection. No significant stenosis of the major arch vessel origins. Pulmonary arteries: As permitted by contrast timing, there are no filling defects in the visualized pulmonary arteries. Subclavian arteries: The subclavian arteries are patent bilaterally. Right carotid system: No evidence of dissection, stenosis (50% or greater), or occlusion. Mild atherosclerosis along the proximal cervical ICA without significant stenosis. Left carotid system: No evidence of dissection, stenosis (50% or greater), or  occlusion. Mild atherosclerosis at the carotid bifurcation without significant stenosis. Vertebral arteries: Codominant. No evidence of dissection, stenosis (50% or greater), or occlusion. Atherosclerosis involving the bilateral V4 segments resulting in mild stenosis. Skeleton: No acute or aggressive finding noted. Other neck: The visualized airway is patent. No cervical lymphadenopathy. Upper chest: Patchy and consolidative opacities in the bilateral upper lobes particularly within the dependent aspect as well as within the superior segment of both lower lobes. Review of the MIP images confirms the above findings CTA HEAD FINDINGS ANTERIOR CIRCULATION: The intracranial ICAs are patent bilaterally. Diffuse atherosclerosis of the bilateral carotid siphons with mild stenosis in the right supraclinoid ICA. No significant stenosis, proximal occlusion, aneurysm, or vascular malformation. MCAs: The middle cerebral arteries are patent bilaterally. ACAs: The anterior cerebral arteries are patent bilaterally. Focal atherosclerosis along the right A3 segment. POSTERIOR CIRCULATION: No significant stenosis, proximal occlusion, aneurysm, or vascular malformation. PCAs: The posterior cerebral arteries are patent bilaterally. Pcomm: Not well visualized. SCAs: The superior cerebellar arteries are patent bilaterally. Basilar artery: The basilar artery is patent. There is subtle irregularity of the vessel without significant narrowing. AICAs: Not well visualized. PICAs: Patent Vertebral arteries: As above. Venous sinuses: As permitted by contrast timing, patent. Anatomic variants: None Review of the MIP images confirms the above findings IMPRESSION: No large vessel occlusion. No high-grade stenosis, aneurysm, or dissection of the arteries in the head and neck. Multifocal atherosclerosis as above. Mild stenosis of the V4 segments of both vertebral arteries. Mild  stenosis of the right supraclinoid ICA. Mild irregularity of the basilar  artery without significant narrowing. Finding concerning for mild vasospasm. Consolidative and patchy opacities in the bilateral upper lobes and superior segments of the bilateral lower lobes. Recommend correlation with dedicated chest imaging. Impression points #1-3 were called by telephone at the time of interpretation on 02/14/2024 at 2:26 pm to provider Dr. Renaee Caro, who verbally acknowledged these results. Electronically Signed   By: Denny Flack M.D.   On: 02/14/2024 14:43   CT HEAD CODE STROKE WO CONTRAST Result Date: 02/14/2024 CLINICAL DATA:  Code stroke. Neuro deficit, concern for stroke, altered mental status and aphasia. EXAM: CT HEAD WITHOUT CONTRAST TECHNIQUE: Contiguous axial images were obtained from the base of the skull through the vertex without intravenous contrast. RADIATION DOSE REDUCTION: This exam was performed according to the departmental dose-optimization program which includes automated exposure control, adjustment of the mA and/or kV according to patient size and/or use of iterative reconstruction technique. COMPARISON:  CT head 11/05/2023. FINDINGS: Brain: Large focus of extra-axial hemorrhage filling the prepontine cistern extending inferiorly to the foramen magnum and into the cervical spinal canal. Confluent region of hemorrhage in the prepontine cistern measures 4.1 x 2.0 cm in axial dimensions with craniocaudal extent of at least 5.2 cm to the level of the foramen magnum. There is mass effect on the brainstem. Additional subdural hemorrhage along the posterior falx and tentorial leaflets. Scattered areas of subarachnoid hemorrhage particularly along the left sylvian fissure. There are intraventricular blood products layering in both occipital horns. Encephalomalacia in the inferior medial left cerebellum concerning for remote infarct. No midline shift. Ventricles: Prominence of the ventricles concerning for developing communicating hydrocephalus. Intraventricular blood products  as above. Vascular: Atherosclerotic calcifications of the carotid siphons and intracranial vertebral arteries. No hyperdense vessel. Skull: No acute or aggressive finding. Orbits: Orbits are symmetric. Sinuses: The visualized paranasal sinuses are clear. Other: Mastoid air cells are clear. ASPECTS [facility] Stroke Program Early CT Score) - Ganglionic level infarction (caudate, lentiform nuclei, internal capsule, insula, M1-M3 cortex): 7 - Supraganglionic infarction (M4-M6 cortex): 3 Total score (0-10 with 10 being normal): 10 IMPRESSION: 1. Large multi compartment intracranial hemorrhage as described above. Dominant focus of hemorrhage involving the prepontine cistern extending into the upper cervical spinal canal. 2. Intraventricular blood products layering in the occipital horns. 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 3. No midline shift. 4. ASPECTS is 10 These results were communicated to Dr. Lindzen at 2:13 pm on 02/14/2024 by text page via the [facility] messaging system. Electronically Signed   By: Denny Flack M.D.   On: 02/14/2024 14:13    Vitals:   02/15/24 1000 02/15/24 1100 02/15/24 1132 02/15/24 1200  BP: 123/63 123/64  114/68  Pulse: 84 85 86 88  Resp: 14 18 17  (!) 21  Temp:      TempSrc:      SpO2: 100% 100% 100% 100%  Weight:      Height:         PHYSICAL EXAM General: Intubated, sedated  CV: Regular rate and rhythm on monitor Respiratory: Mechanically ventilated  GI: Abdomen soft and nontender   NEURO:  Mental status: intubated on sedation, eyes closed, not following commands.  forced eye opening, eyes in mid position, not blinking to visual threat, doll's eyes sluggish, not tracking, PERRL.  Corneal reflex absent on the left, weak on the right, gag and cough present. Breathing over the vent.   Facial symmetry not able to test  due to ET tube.  Tongue protrusion not cooperative.  BUEs drift to bed, seems symmetrical. BLEs  withdraw to pain, seems symmetrical.  Sensation, coordination and gait not tested.   ASSESSMENT/PLAN  Ms. Sharita Naseer Linan is a 43 y.o. female with history of hypertension, ESRD on HD, s/p renal and pancreatic transplant, diabetes mellitus type 1 with gastroparesis presents with nausea and vomiting since after dialysis.  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 Intraventricular blood products layering in the occipital horns.  CTA head & neck - No large vessel occlusion. No high-grade stenosis, aneurysm, or dissection of the arteries in the head and neck. Mild stenosis of the V4 segments of both vertebral arteries. Mild stenosis of the right supraclinoid ICA.  6/12 repeat CT Head- Interval placement of a right frontal approach ventriculostomy with tip in the body of the right lateral ventricle. Persistent but relatively stable hydrocephalus at this time. Moderate volume acute subarachnoid hemorrhage with associated IVH, similar to prior. No evidence for significant interval bleeding since prior. 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  NSGY consulted EVD placement 6/12 On Keppra   ? Vasospasm  CTA head and neck Mild irregularity of the basilar artery without significant narrowing. Finding concerning for mild vasospasm. TCD monitoring pending On nimodipine    Respiratory failure  Aspiration pneumonia PCCM  primary Intubated 6/12 Sedated with propofol , fentanyl  On Zosyn   Hypertensive Emergency  Home meds:  Norvasc , coreg , clonidine  patch, hydralazine   Unstable Cleviprex , labetalol  PRN BP goal < 160 Long term BP goal normotensive  Diabetes Type 1 Hx of pancreas transplant  HgbA1c 4.2, goal < 7.0 CBGs, SSI  ESRD on HD Hx of renal transplant  HD patient with fistula Nephrology on board Now on CRRT   Dysphagia NG tube in place Speech therapy consult Nutrition consult for tube feeds  Other stroke risk factors   Other medical issues DM gastroparesis  Colitis  Thrombocytopenia - platelet 109  Hospital day # 1  Jonette Nestle DNP, ACNPC-AG  [name]  ATTENDING NOTE: I reviewed above note and agree with the assessment and plan. Pt was seen and examined.   RN at the bedside. She is still intubated on sedation, eyes closed, not following commands. But per RN, she was able to move toes and lift leg up intermittently as requested. With forced eye opening, eyes in mid position, not blinking to visual threat, doll's eyes sluggish, not tracking, PERRL. Corneal reflex absent on the left, weak on the right, gag and cough present. Breathing over the vent.  Facial symmetry not able to test due to ET tube.  Tongue protrusion not cooperative. BUEs drift to bed, seems symmetrical. BLEs withdraw to pain, seems symmetrical. Sensation, coordination and gait not tested.   For detailed assessment and plan, please refer to above as I have made changes wherever appropriate.   Consuelo Denmark, MD PhD Stroke Neurology 02/15/2024 8:25 PM  This patient is critically ill due to [facility] and IVH, hydrocephalus, respirator failure and at significant  risk of neurological worsening, death form vasospasm, obstructive hydrocephalus, renal failure, sepsis. This patient's care requires constant monitoring of vital signs, hemodynamics, respiratory and cardiac monitoring, review of multiple databases,  neurological assessment, discussion with family, other specialists and medical decision making of high complexity. I spent 40 minutes of neurocritical care time in the care of this patient.     To contact Stroke Continuity provider, please refer to [email or web address]. After hours, contact General Neurology

## 2024-02-15 NOTE — Progress Notes (Signed)
 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 Hypertensive in ED SBP 240s. Went for CT a/p and was c/f some colitis. Started on abx, given IVF. C/o pain and was very agitated, rcvd pain meds. Admitted 6/12. Over the course of the day she became less responsive. Remained hypertensive w SBPs >200. Incr O2 req to [facility]. Pt was to rcv HD 6/12 afternoon but when eval by HD NP, concerns for airway protection. Rcvd narcan  with only modest improvement. PCCM consulted in this setting    Pertinent  Medical History  Failed renal / pancreas txp ESRD  HTN DM1 gastroparesis   Significant Hospital Events: Including procedures, antibiotic start and stop dates in addition to other pertinent events   6/11 ED w n/v AMS. Abx IVF for colitis 6/12 admit. AMS decr LOC. Incr O2 req. PCCM consult and code stroke called seen w rapid response nephro NP then neuro  6/13 intubated with EVD, responding to commands   Interim History / Subjective:  Repeat head CT without acute worsening In CRRT Neuro exam improved, poor ventilation with SBT  Objective    Blood pressure 129/61, pulse 76, temperature 98.4 F (36.9 C), temperature source Axillary, resp. rate 16, height 5' 7.5 (1.715 m), weight 55.9 kg, last menstrual period 02/13/2024, SpO2 100%.    Vent Mode: PRVC FiO2 (%):  [50 %-100 %] 50 % Set Rate:  [18 bmp] 18 bmp Vt Set:  [500 mL] 500 mL PEEP:  [5 cmH20] 5 cmH20 Plateau Pressure:  [25 cmH20] 25 cmH20   Intake/Output Summary (Last 24 hours) at 02/15/2024 0740 Last data filed at 02/15/2024 0700 Gross per 24 hour  Intake 1425.41 ml  Output 1235.1 ml  Net 190.31 ml   Filed Weights   02/13/24  2007 02/15/24 0400  Weight: 59.3 kg 55.9 kg    Examination: General: Chronically and critically ill appearing middle aged F intubated and sedated  Neuro:  examined on propofol  and fentanyl , but following commands on the R with flickers on the L, opens eyes to voice  HENT: Protuberant tongue Anicteric sclera  Lungs: clear bilaterally on full vent support   Cardiovascular: sinus tachycardia  Abdomen: soft and non-distended  Extremities: RUE fistula  GU: defer     Resolved problem list   Assessment and Plan   Large intracranial hemorrhage with brain compression -ICH score 4 Scattered SAH  Communicating hydrocephalus  Hypertensive emergency  Acute respiratory failure w hypoxia Aspiration PNA  Pulmonary edema  ESRD on HD  Hyperkalemia  Hx failed renal txp & s/p pancreatic txp (2014) on chronic immunosuppression DM1 (sp pancreas txp) Possible colitis  Hx prior ischemic stroke  P -CTH with large multi-compartment ICH with EVD placement and 10-20cc/hr output -NSGY following  -intubated, failed SBT --Maintain full vent support with SAT/SBT as tolerated -titrate Vent setting to maintain SpO2 greater than or equal to 90%. -HOB elevated 30 degrees. -Plateau pressures less than 30 cm H20.  -Follow chest x-ray, ABG prn.   -Bronchial hygiene and RT/bronchodilator protocol. -continue CRRT -continue Keppra  and Nimodipine  -mycophenolate  and pred 15 on hold  -- restart when able  -ssi  -continue Zosyn  for  aspiration and colitis  -start oxy and continue fentanyl  and propofol  for PAD protocol  -resume home amlodipine , continue Cleviprex  to maintain SBP at goal 120-130  Best Practice (right click and Reselect all SmartList Selections daily)   Diet/type: NPO DVT prophylaxis SCD Pressure ulcer(s): pressure ulcer assessment deferred  GI prophylaxis: PPI Lines: N/A Foley:  N/A Code Status:  full code Last date of multidisciplinary goals of care discussion [--]  Labs    CBC: Recent Labs  Lab 02/13/24 2109 02/14/24 1243 02/14/24 1716 02/14/24 1741 02/15/24 0430  WBC 4.4 11.2*  --  9.8 7.3  HGB 11.9* 12.2 10.2* 10.5* 9.6*  HCT 37.3 37.5 30.0* 32.9* 30.8*  MCV 94.7 92.8  --  93.5 97.2  PLT 91* 109*  --  116*  109* 119*    Basic Metabolic Panel: Recent Labs  Lab 02/13/24 2109 02/14/24 0949 02/14/24 1716 02/14/24 1741 02/15/24 0430  NA 136 139 137 139 140  139  K 5.8* 6.1* 5.5* 5.4* 4.6  4.5  CL 93* 97*  --  96* 101  101  CO2 24 25  --  26 25  25   GLUCOSE 122* 130*  --  128* 86  86  BUN 28* 36*  --  39* 28*  28*  CREATININE 5.38* 6.33*  --  6.89* 4.95*  4.87*  CALCIUM  9.6 9.6  --  9.2 9.4  9.4  MG  --   --   --   --  2.4  PHOS  --  6.1*  --   --  4.6   GFR: Estimated Creatinine Clearance: 13.1 mL/min (A) (by C-G formula based on SCr of 4.95 mg/dL (H)). Recent Labs  Lab 02/13/24 2109 02/14/24 0949 02/14/24 1243 02/14/24 1741 02/15/24 0430  WBC 4.4  --  11.2* 9.8 7.3  LATICACIDVEN  --  1.4 1.3  --   --     Liver Function Tests: Recent Labs  Lab 02/13/24 2109 02/14/24 0949 02/15/24 0430  AST 16  --   --   ALT 11  --   --   ALKPHOS 92  --   --   BILITOT 0.8  --   --   PROT 7.6  --   --   ALBUMIN 3.9 3.7 3.0*   Recent Labs  Lab 02/13/24 2109  LIPASE 35   No results for input(s): AMMONIA in the last 168 hours.  ABG    Component Value Date/Time   PHART 7.444 02/14/2024 1716   PCO2ART 44.3 02/14/2024 1716   PO2ART 339 (H) 02/14/2024 1716   HCO3 30.3 (H) 02/14/2024 1716   TCO2 32 02/14/2024 1716   ACIDBASEDEF 5.2 (H) 01/13/2015 0300   O2SAT 100 02/14/2024 1716     Coagulation Profile: Recent Labs  Lab 02/14/24 1741  INR 1.3*    Cardiac Enzymes: No results for input(s): CKTOTAL, CKMB, CKMBINDEX, TROPONINI in the last 168 hours.  HbA1C: Hgb A1c MFr Bld  Date/Time Value Ref Range Status  02/14/2024 09:49 AM 4.2 (L) 4.8 - 5.6 % Final    Comment:    (NOTE) Diagnosis of Diabetes The  following HbA1c ranges recommended by the American Diabetes Association (ADA) may be used as an aid in the diagnosis of diabetes mellitus.  Hemoglobin             Suggested A1C NGSP%              Diagnosis  <5.7  Non Diabetic  5.7-6.4                Pre-Diabetic  >6.4                   Diabetic  <7.0                   Glycemic control for                       adults with diabetes.    11/11/2021 02:25 AM 4.1 (L) 4.8 - 5.6 % Final    Comment:    (NOTE) Pre diabetes:          5.7%-6.4%  Diabetes:              >6.4%  Glycemic control for   <7.0% adults with diabetes     CBG: Recent Labs  Lab 02/14/24 1346 02/14/24 1554 02/14/24 2000 02/14/24 2356 02/15/24 0342  GLUCAP 139* 132* 146* 82 79    Review of Systems:   Unable to obtain   Past Medical History:  She,  has a past medical history of Anemia, Anxiety, DM (diabetes mellitus), type 1 (HCC), ESRD on hemodialysis (HCC), Gastroparesis, Hypertension, Narcotic abuse (HCC), Non compliance with medical treatment, and Vision loss.   Surgical History:   Past Surgical History:  Procedure Laterality Date   A/V FISTULAGRAM Right 02/17/2021   Procedure: A/V FISTULAGRAM;  Surgeon: Young Hensen, MD;  Location: [facility] INVASIVE CV LAB;  Service: Cardiovascular;  Laterality: Right;   AV FISTULA PLACEMENT  11/17/2011   Procedure: ARTERIOVENOUS (AV) FISTULA CREATION;  Surgeon: Mayo Speck, MD;  Location: [facility] OR;  Service: Vascular;  Laterality: Right;   BIOPSY  12/03/2021   Procedure: BIOPSY;  Surgeon: Albertina Hugger, MD;  Location: [facility] ENDOSCOPY;  Service: Gastroenterology;;   ESOPHAGOGASTRODUODENOSCOPY (EGD) WITH PROPOFOL  N/A 12/03/2021   Procedure: ESOPHAGOGASTRODUODENOSCOPY (EGD) WITH PROPOFOL ;  Surgeon: Albertina Hugger, MD;  Location: [facility] ENDOSCOPY;  Service: Gastroenterology;  Laterality: N/A;   EYE SURGERY     HEMATOMA EVACUATION Right 02/25/2021   Procedure: EVACUATION HEMATOMA RIGHT CHEST;  Surgeon:  Adine Hoof, MD;  Location: [facility] OR;  Service: Vascular;  Laterality: Right;   INSERTION OF DIALYSIS CATHETER Right 12/08/2020   Procedure: INSERTION OF TUNNELED DIALYSIS CATHETER;  Surgeon: Young Hensen, MD;  Location: [facility] OR;  Service: Vascular;  Laterality: Right;   IR REMOVAL TUN CV CATH W/O FL  05/03/2021   KIDNEY TRANSPLANT     NEPHRECTOMY TRANSPLANTED ORGAN     pancrease transplant     PERIPHERAL VASCULAR BALLOON ANGIOPLASTY Right 02/17/2021   Procedure: PERIPHERAL VASCULAR BALLOON ANGIOPLASTY;  Surgeon: Young Hensen, MD;  Location: [facility] INVASIVE CV LAB;  Service: Cardiovascular;  Laterality: Right;   REFRACTIVE SURGERY     REVISON OF ARTERIOVENOUS FISTULA Right 12/08/2020   Procedure: RIGHT ARM ARTERIOVENOUS FISTULA REVISION AND RESECTION;  Surgeon: Young Hensen, MD;  Location: [facility] OR;  Service: Vascular;  Laterality: Right;   REVISON OF ARTERIOVENOUS FISTULA Right 02/25/2021   Procedure: RIGHT ARM ARTERIOVENOUS FISTULA REVISION WITH TRANSPOSITION OF CEPHALIC VEIN ON AXILLARY VEIN;  Surgeon: Young Hensen, MD;  Location: [facility] OR;  Service: Vascular;  Laterality: Right;     Social History:   reports that she has quit smoking. Her smoking use included pipe. She has never used smokeless tobacco. She reports that she does not drink alcohol  and does not use drugs.  Family History:  Her family history includes Hypertension in her father.   Allergies No Known Allergies   Home Medications  Prior to Admission medications   Medication Sig Start Date End Date Taking? Authorizing Provider  amLODipine  (NORVASC ) 10 MG tablet Take 1 tablet (10 mg total) by mouth daily. 02/05/22 11/06/23  Hoyt Macleod, MD  aspirin  EC 81 MG tablet Chew 81 mg by mouth daily.    [provider]  busPIRone  (BUSPAR ) 10 MG tablet Take 10 mg by mouth 2 (two) times daily. 10/25/23   [provider]  carvedilol  (COREG ) 12.5 MG tablet Take 1 tablet (12.5 mg total) by mouth 2  (two) times daily with a meal. Patient not taking: Reported on 11/06/2023 02/05/22 11/06/23  Hoyt Macleod, MD  cinacalcet  (SENSIPAR ) 30 MG tablet Take 3 tablets (90 mg total) by mouth daily with supper. 06/14/23   Amin, Sumayya, MD  cloNIDine  (CATAPRES  - DOSED IN MG/24 HR) 0.2 mg/24hr patch 0.2 mg once a week.    [provider]  cycloSPORINE  modified (NEORAL ) 100 MG capsule Take 2 capsules (200 mg total) by mouth 2 (two) times daily. 12/25/21   Pokhrel, Laxman, MD  cycloSPORINE  modified (NEORAL ) 25 MG capsule Take 25 mg by mouth daily.    [provider]  Darbepoetin Alfa  (ARANESP ) 60 MCG/0.3ML SOSY injection Inject 0.3 mLs (60 mcg total) into the vein every Friday with hemodialysis. 11/18/21   Abbe Abate, MD  doxercalciferol  (HECTOROL ) 4 MCG/2ML injection Inject 3 mLs (6 mcg total) into the vein every Monday, Wednesday, and Friday with hemodialysis. 11/17/21   Abbe Abate, MD  DULoxetine  (CYMBALTA ) 20 MG capsule Take 1 capsule (20 mg total) by mouth daily. 02/06/22 11/06/23  Hoyt Macleod, MD  ferric citrate  (AURYXIA ) 1 GM 210 MG(Fe) tablet Take 210-630 mg by mouth See admin instructions. Take 3 tablets (630 mg) by mouth three times daily with meals, and take 3 tablets (630 mg) with snacks    [provider]  gabapentin  (NEURONTIN ) 100 MG capsule Take 100 mg by mouth 2 (two) times daily. 10/25/23   [provider]  hydrALAZINE  (APRESOLINE ) 25 MG tablet Take 3 tablets (75 mg total) by mouth every 8 (eight) hours. 02/05/22 11/06/23  Hoyt Macleod, MD  hydrOXYzine  (VISTARIL ) 25 MG capsule Take 25 mg by mouth 3 (three) times daily as needed for anxiety. 05/26/23   [provider]  lidocaine -prilocaine  (EMLA ) cream Apply 1 application  topically daily as needed (prior to port access). 07/09/20   [provider]  losartan  (COZAAR ) 100 MG tablet Take 1 tablet (100 mg total) by mouth daily. 12/04/21 11/06/23  [place], Rema Care, DO  MELATONIN PO Take 1 tablet by  mouth at bedtime.    [provider]  metoCLOPramide  (REGLAN ) 5 MG tablet Take 5 mg by mouth every 6 (six) hours as needed for nausea or vomiting.    [provider]  mycophenolate  (MYFORTIC ) 180 MG EC tablet Take 540 mg by mouth 2 (two) times daily.    [provider]  ondansetron  (ZOFRAN ) 4 MG tablet Take 4 mg by mouth every 8 (eight) hours as needed for nausea or vomiting.    [provider]  oxyCODONE -acetaminophen  (PERCOCET/ROXICET) 5-325 MG tablet Take 1 tablet by mouth 3 (three) times daily.    [provider]  predniSONE  (DELTASONE ) 5 MG tablet Take 15 mg by mouth daily. 06/24/15   [provider]  sodium zirconium cyclosilicate  (LOKELMA ) 10 g PACK packet Take 10 g by  mouth daily.    [provider]  XPHOZAH 30 MG TABS Take 1 tablet by mouth 2 (two) times daily. 06/21/23   [provider]  zolpidem  (AMBIEN ) 5 MG tablet Take 5 mg by mouth at bedtime as needed for sleep.    [provider]     Critical care time: 40 min      CRITICAL CARE Performed by: Patt Boozer Janaysha Depaulo   Total critical care time: 40 minutes  Critical care time was exclusive of separately billable procedures and treating other patients.  Critical care was necessary to treat or prevent imminent or life-threatening deterioration.  Critical care was time spent personally by me on the following activities: development of treatment plan with patient and/or surrogate as well as nursing, discussions with consultants, evaluation of patient's response to treatment, examination of patient, obtaining history from patient or surrogate, ordering and performing treatments and interventions, ordering and review of laboratory studies, ordering and review of radiographic studies, pulse oximetry and re-evaluation of patient's condition.  Patt Boozer Virginia Curl, PA-C [facility] See Amion for pager If no response to pager , please call [phone]  [phone] until 7pm After 7:00 pm call Elink  [phone]

## 2024-02-15 NOTE — Progress Notes (Signed)
 Initial Nutrition Assessment  DOCUMENTATION CODES:   Non-severe (moderate) malnutrition in context of chronic illness  INTERVENTION:   Initiate tube feeding via Cortrak tube: Osmolite 1.5 at 20 ml/h and increase by 10 ml every 8 hours to goal rate of 50 ml/hr (1200 ml per day)  Prosource TF20 60 ml BID  Provides 1960 kcal, 115 gm protein, 912 ml free water  daily  Monitor magnesium  and phosphorus daily x 4 occurrences, MD to replete as needed, as pt is at risk for refeeding syndrome given pt meets criteria for moderate malnutrition.   NUTRITION DIAGNOSIS:   Moderate Malnutrition related to chronic illness (ESRD on HD/DM) as evidenced by mild fat depletion, mild muscle depletion.  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   Pt discussed during ICU rounds and with RN and MD.  Spoke with CCM PA, plans for possible extubation today. Remains intubated at this time. CRRT continues with EVD in place.   Per Reanl notes pt has hx of noncompliance, narcotic dependence and rarely stays at HD longer than 3 hours.  EDW 57.2 kg Pt currently lower than EDW.   6/11 - ED for abx for colitis 6/12 - admit for ICH; intubated with EVD; started CRRT 6/13 - s/p cortrak placement; tip gastric  Medications reviewed and include: colace, SSI every 4 hours, keppra , protonix , miralax , senokot-s Cleviprex  @ 28 ml/hr provides: 1344 kcal  Fentanyl   Propofol  @   Labs reviewed:  K 6.1 -> 5.5 -> 5.4 -> 4.6 -> 4.5 BUN/Cr 28/4.87 Phos 6.1 -> 4.6 A1C 4.2  ICP 162 ml CRRT 1073 ml  NUTRITION - FOCUSED PHYSICAL EXAM:  Flowsheet  Row Most Recent Value  Orbital Region Mild depletion  Upper Arm Region Mild depletion  Thoracic and Lumbar Region Unable to assess  Buccal Region Unable to assess  Temple Region Unable to assess  Clavicle Bone Region No depletion  Clavicle and Acromion Bone Region No depletion  Scapular Bone Region Unable to assess  Dorsal Hand No depletion  Patellar Region Mild depletion  Anterior Thigh Region Mild depletion  Posterior Calf Region Mild depletion  Edema (RD Assessment) None  Hair Reviewed  Eyes Unable to assess  Mouth Unable to assess  Skin Reviewed  Nails Reviewed    Diet Order:   Diet Order             Diet NPO time specified  Diet effective now                   EDUCATION NEEDS:   No education needs have been identified at this time  Skin:  Skin Assessment: Reviewed RN Assessment  Last BM:  unknown; abd soft  Height:   Ht Readings from Last 1 Encounters:  02/14/24 5' 7.5 (1.715 m)    Weight:   Wt Readings from Last 1 Encounters:  02/15/24 55.9 kg    BMI:  Body mass index is 19.02 kg/m.  Estimated Nutritional Needs:   Kcal:  1900-2100  Protein:  115-140 grams  Fluid:  1L + UOP  Lorriane Dehart P., RD, LDN, CNSC See AMiON for contact information

## 2024-02-15 NOTE — Progress Notes (Signed)
 VASCULAR LAB    TCD attempted.  Patient having beside procedure.  The Vascular Lab will re-attempt as schedule permits.    Bohden Dung, RVT 02/15/2024, 1:15 PM

## 2024-02-15 NOTE — Procedures (Signed)
 Cortrak  Person Inserting Tube:  Edwena Graham D, RD Tube Type:  Cortrak - 43 inches Tube Size:  10 Tube Location:  Left nare Initial Placement:  Stomach Secured by: Bridle Technique Used to Measure Tube Placement:  Marking at nare/corner of mouth Cortrak Secured At:  69 cm Procedure Comments:  Cortrak Tube Team Note:  Consult received to place a Cortrak feeding tube.   No x-ray is required. RN may begin using tube.   If the tube becomes dislodged please keep the tube and contact the Cortrak team at [email or web address] for replacement.  If after hours and replacement cannot be delayed, place a NG tube and confirm placement with an abdominal x-ray.    Edwena Graham, RD, LDN Registered Dietitian II Please reach out via secure chat

## 2024-02-15 NOTE — Progress Notes (Signed)
 [facility] Progress Note   Subjective:   seen in ICU - CRRT running well. RN reports mental status improving but remains intubated and sedated.  Intraventricular drain in place.   Objective Vitals:   02/15/24 0700 02/15/24 0800 02/15/24 0900 02/15/24 1000  BP: 129/61 126/62 135/64 123/63  Pulse: 76 79 78 84  Resp: 16 18 20 14   Temp:  98.2 F (36.8 C)    TempSrc:  Axillary    SpO2: 100% 100% 100% 100%  Weight:      Height:       Physical Exam General: ill appearing, intubated and sedated Heart: RRR Lungs: clear on vent Abdomen:soft Extremities: trace edema Dialysis Access: temp femoral HD catheter, RUE AV access +t/b  Additional Objective Labs: Basic Metabolic Panel: Recent Labs  Lab 02/14/24 0949 02/14/24 1716 02/14/24 1741 02/15/24 0430  NA 139 137 139 140  139  K 6.1* 5.5* 5.4* 4.6  4.5  CL 97*  --  96* 101  101  CO2 25  --  26 25  25   GLUCOSE 130*  --  128* 86  86  BUN 36*  --  39* 28*  28*  CREATININE 6.33*  --  6.89* 4.95*  4.87*  CALCIUM  9.6  --  9.2 9.4  9.4  PHOS 6.1*  --   --  4.6   Liver Function Tests: Recent Labs  Lab 02/13/24 2109 02/14/24 0949 02/15/24 0430  AST 16  --   --   ALT 11  --   --   ALKPHOS 92  --   --   BILITOT 0.8  --   --   PROT 7.6  --   --   ALBUMIN 3.9 3.7 3.0*   Recent Labs  Lab 02/13/24 2109  LIPASE 35   CBC: Recent Labs  Lab 02/13/24 2109 02/14/24 1243 02/14/24 1716 02/14/24 1741 02/15/24 0430  WBC 4.4 11.2*  --  9.8 7.3  HGB 11.9* 12.2 10.2* 10.5* 9.6*  HCT 37.3 37.5 30.0* 32.9* 30.8*  MCV 94.7 92.8  --  93.5 97.2  PLT 91* 109*  --  116*  109* 119*   Blood Culture    Component Value Date/Time   SDES BLOOD RIGHT HAND 02/14/2024 0949   SPECREQUEST  02/14/2024 0949    AEROBIC BOTTLE ONLY Blood Culture results may not be optimal due to an inadequate volume of blood received in culture bottles   CULT  02/14/2024 0949    NO GROWTH < 24 HOURS Performed at [facility]  Lab, [address], [place], [name] [address]    REPTSTATUS PENDING 02/14/2024 6045    Cardiac Enzymes: No results for input(s): CKTOTAL, CKMB, CKMBINDEX, TROPONINI in the last 168 hours. CBG: Recent Labs  Lab 02/14/24 1554 02/14/24 2000 02/14/24 2356 02/15/24 0342 02/15/24 0813  GLUCAP 132* 146* 82 79 75   Iron  Studies: No results for input(s): IRON , TIBC, TRANSFERRIN, FERRITIN in the last 72 hours. @lablastinr3 @ Studies/Results: DG Abd Portable 1V Result Date: 02/14/2024 CLINICAL DATA:  NG tube placement EXAM: PORTABLE ABDOMEN - 1 VIEW COMPARISON:  11/05/2023 FINDINGS: Enteric tube tip and side port overlie proximal stomach. Possible partial kink in the tubing at the level of the side port. Nonobstructed gas pattern IMPRESSION: Enteric tube tip and side port overlie proximal stomach. Possible partial kink in the tubing at the level of the side port. Electronically Signed   By: Esmeralda Hedge M.D.   On: 02/14/2024 23:18   CT HEAD POST  STROKE FOLLOWUP/TIMED/STAT READ Result Date: 02/14/2024 CLINICAL DATA:  Follow-up examination for neuro deficit, stroke suspected. EXAM: CT HEAD WITHOUT CONTRAST TECHNIQUE: Contiguous axial images were obtained from the base of the skull through the vertex without intravenous contrast. RADIATION DOSE REDUCTION: This exam was performed according to the departmental dose-optimization program which includes automated exposure control, adjustment of the mA and/or kV according to patient size and/or use of iterative reconstruction technique. COMPARISON:  Comparison made with CTs from earlier the same day. FINDINGS: Brain: IV contrast material on board related to prior CTA, mildly limiting assessment. Large volume clot/hemorrhage involving the prepontine cistern with extension towards the foramen magnum is relatively similar to prior. Scattered subarachnoid blood along the left greater than right sylvian fissures. Intraventricular hemorrhage with blood  seen layering in the occipital horns of both lateral ventricles. Overall, degree of hemorrhage is similar to prior. Interval placement of a right frontal approach ventriculostomy with tip in the body of the right lateral ventricle. Persistent hydrocephalus at this time, relatively similar. Chronic left cerebellar infarct noted. No visible acute large vessel territory infarct. No new intracranial hemorrhage. No significant extra-axial collection. Vascular: IV contrast material seen throughout the intracranial vasculature. Calcified atherosclerosis present at skull base. Skull: Sequelae of right frontal approach ventriculostomy. Sinuses/Orbits: Globes orbital soft tissues within normal limits. Paranasal sinuses are largely clear. Trace right mastoid effusion noted, of doubtful significance. Other: None. IMPRESSION: 1. Interval placement of a right frontal approach ventriculostomy with tip in the body of the right lateral ventricle. Persistent but relatively stable hydrocephalus at this time. 2. Moderate volume acute subarachnoid hemorrhage with associated IVH, similar to prior. No evidence for significant interval bleeding since prior. 3. No other new acute intracranial abnormality. Electronically Signed   By: Virgia Griffins M.D.   On: 02/14/2024 20:07   DG CHEST PORT 1 VIEW Result Date: 02/14/2024 CLINICAL DATA:  Central line placement EXAM: PORTABLE CHEST 1 VIEW COMPARISON:  March 06, 2022 FINDINGS: Tip of the endotracheal tube 1.5 cm from carina Right IJ CVP line tip in the cavoatrial junction Bilateral pulmonary infiltrates may correlate with bilateral pneumonia versus congestive changes or combination of both. No pneumothorax Heart and mediastinum normal without pleural effusions IMPRESSION: *Tip of the endotracheal tube 1.5 cm from carina. *Right IJ CVP line tip in the cavoatrial junction. *Bilateral pulmonary infiltrates may correlate with bilateral pneumonia versus congestive changes or combination of  both. Electronically Signed   By: Fredrich Jefferson M.D.   On: 02/14/2024 16:19   CT ANGIO HEAD NECK W WO CM (CODE STROKE) Result Date: 02/14/2024 CLINICAL DATA:  Neuro deficit, concern for stroke, aphasia, arm tremor, mental status change. EXAM: CT ANGIOGRAPHY HEAD AND NECK WITH AND WITHOUT CONTRAST TECHNIQUE: Multidetector CT imaging of the head and neck was performed using the standard protocol during bolus administration of intravenous contrast. Multiplanar CT image reconstructions and MIPs were obtained to evaluate the vascular anatomy. Carotid stenosis measurements (when applicable) are obtained utilizing NASCET criteria, using the distal internal carotid diameter as the denominator. RADIATION DOSE REDUCTION: This exam was performed according to the departmental dose-optimization program which includes automated exposure control, adjustment of the mA and/or kV according to patient size and/or use of iterative reconstruction technique. CONTRAST:  75mL OMNIPAQUE  IOHEXOL  350 MG/ML SOLN COMPARISON:  Same day CT head. FINDINGS: CTA NECK FINDINGS Aortic arch: Standard configuration of the aortic arch. Imaged portion shows no evidence of aneurysm or dissection. No significant stenosis of the major arch vessel origins. Pulmonary arteries: As permitted  by contrast timing, there are no filling defects in the visualized pulmonary arteries. Subclavian arteries: The subclavian arteries are patent bilaterally. Right carotid system: No evidence of dissection, stenosis (50% or greater), or occlusion. Mild atherosclerosis along the proximal cervical ICA without significant stenosis. Left carotid system: No evidence of dissection, stenosis (50% or greater), or occlusion. Mild atherosclerosis at the carotid bifurcation without significant stenosis. Vertebral arteries: Codominant. No evidence of dissection, stenosis (50% or greater), or occlusion. Atherosclerosis involving the bilateral V4 segments resulting in mild stenosis.  Skeleton: No acute or aggressive finding noted. Other neck: The visualized airway is patent. No cervical lymphadenopathy. Upper chest: Patchy and consolidative opacities in the bilateral upper lobes particularly within the dependent aspect as well as within the superior segment of both lower lobes. Review of the MIP images confirms the above findings CTA HEAD FINDINGS ANTERIOR CIRCULATION: The intracranial ICAs are patent bilaterally. Diffuse atherosclerosis of the bilateral carotid siphons with mild stenosis in the right supraclinoid ICA. No significant stenosis, proximal occlusion, aneurysm, or vascular malformation. MCAs: The middle cerebral arteries are patent bilaterally. ACAs: The anterior cerebral arteries are patent bilaterally. Focal atherosclerosis along the right A3 segment. POSTERIOR CIRCULATION: No significant stenosis, proximal occlusion, aneurysm, or vascular malformation. PCAs: The posterior cerebral arteries are patent bilaterally. Pcomm: Not well visualized. SCAs: The superior cerebellar arteries are patent bilaterally. Basilar artery: The basilar artery is patent. There is subtle irregularity of the vessel without significant narrowing. AICAs: Not well visualized. PICAs: Patent Vertebral arteries: As above. Venous sinuses: As permitted by contrast timing, patent. Anatomic variants: None Review of the MIP images confirms the above findings IMPRESSION: No large vessel occlusion. No high-grade stenosis, aneurysm, or dissection of the arteries in the head and neck. Multifocal atherosclerosis as above. Mild stenosis of the V4 segments of both vertebral arteries. Mild stenosis of the right supraclinoid ICA. Mild irregularity of the basilar artery without significant narrowing. Finding concerning for mild vasospasm. Consolidative and patchy opacities in the bilateral upper lobes and superior segments of the bilateral lower lobes. Recommend correlation with dedicated chest imaging. Impression points #1-3  were called by telephone at the time of interpretation on 02/14/2024 at 2:26 pm to provider Dr. Renaee Caro, who verbally acknowledged these results. Electronically Signed   By: Denny Flack M.D.   On: 02/14/2024 14:43   CT HEAD CODE STROKE WO CONTRAST Result Date: 02/14/2024 CLINICAL DATA:  Code stroke. Neuro deficit, concern for stroke, altered mental status and aphasia. EXAM: CT HEAD WITHOUT CONTRAST TECHNIQUE: Contiguous axial images were obtained from the base of the skull through the vertex without intravenous contrast. RADIATION DOSE REDUCTION: This exam was performed according to the departmental dose-optimization program which includes automated exposure control, adjustment of the mA and/or kV according to patient size and/or use of iterative reconstruction technique. COMPARISON:  CT head 11/05/2023. FINDINGS: Brain: Large focus of extra-axial hemorrhage filling the prepontine cistern extending inferiorly to the foramen magnum and into the cervical spinal canal. Confluent region of hemorrhage in the prepontine cistern measures 4.1 x 2.0 cm in axial dimensions with craniocaudal extent of at least 5.2 cm to the level of the foramen magnum. There is mass effect on the brainstem. Additional subdural hemorrhage along the posterior falx and tentorial leaflets. Scattered areas of subarachnoid hemorrhage particularly along the left sylvian fissure. There are intraventricular blood products layering in both occipital horns. Encephalomalacia in the inferior medial left cerebellum concerning for remote infarct. No midline shift. Ventricles: Prominence of the ventricles concerning for developing communicating  hydrocephalus. Intraventricular blood products as above. Vascular: Atherosclerotic calcifications of the carotid siphons and intracranial vertebral arteries. No hyperdense vessel. Skull: No acute or aggressive finding. Orbits: Orbits are symmetric. Sinuses: The visualized paranasal sinuses are clear. Other: Mastoid  air cells are clear. ASPECTS [facility] Stroke Program Early CT Score) - Ganglionic level infarction (caudate, lentiform nuclei, internal capsule, insula, M1-M3 cortex): 7 - Supraganglionic infarction (M4-M6 cortex): 3 Total score (0-10 with 10 being normal): 10 IMPRESSION: 1. Large multi compartment intracranial hemorrhage as described above. Dominant focus of hemorrhage involving the prepontine cistern extending into the upper cervical spinal canal. 2. Intraventricular blood products layering in the occipital horns. 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 3. No midline shift. 4. ASPECTS is 10 These results were communicated to Dr. Lindzen at 2:13 pm on 02/14/2024 by text page via the [facility] messaging system. Electronically Signed   By: Denny Flack M.D.   On: 02/14/2024 14:13   CT ABDOMEN PELVIS WO CONTRAST Result Date: 02/14/2024 CLINICAL DATA:  Abdominal pain, nausea, and vomiting after dialysis. EXAM: CT ABDOMEN AND PELVIS WITHOUT CONTRAST TECHNIQUE: Multidetector CT imaging of the abdomen and pelvis was performed following the standard protocol without IV contrast. RADIATION DOSE REDUCTION: This exam was performed according to the departmental dose-optimization program which includes automated exposure control, adjustment of the mA and/or kV according to patient size and/or use of iterative reconstruction technique. COMPARISON:  CTs without contrast 11/05/2023 and 09/26/2023. FINDINGS: Lower chest: There are new small layering pleural effusions, interval developed interlobular septal edema, and increasing patchy haziness throughout the lung bases consistent with ground-glass edema or pneumonitis. Stable cardiomegaly with small but circumferential pericardial effusion. There is multivessel coronary atherosclerosis. Small hiatal hernia. Hepatobiliary: There is loss of fine detail due to respiratory motion and due to streak artifact from overlying wires. No liver  mass is seen without contrast. The liver is enlarged 22.5 cm length. There are few tiny stones in the posterior gallbladder without wall thickening or bile duct dilatation. Pancreas: No abnormality is seen through the motion and streak artifacts. 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 No inflammatory changes seen or ductal dilatation. Spleen: No focal abnormality is seen through the motion and streak artifacts. The spleen is enlarged measuring 15 cm in length, as before. Adrenals/Urinary Tract: No adrenal mass. Severely atrophic native kidneys with heavy renovascular calcifications. 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 There are small stable cortical cysts. The bladder is contracted and unchanged in appearance. Stomach/Bowel: Unremarkable contracted stomach. Normal caliber in the small bowel except for mild chronic dilatation at the pancreaticojejunostomy site anterior mid abdomen. There is an increasingly thickened appearance in the ascending, descending and sigmoid colon concerning for colitis. There are stranding changes over portions of this. No pneumatosis is seen. The appendix is normal caliber. Vascular/Lymphatic: No enlarged lymph nodes are seen. There are extensive heavy aortic and branch vessel atherosclerotic calcific plaques. No AAA. Reproductive: 4 cm partially calcified dorsal uterine fibroid to the right. Uterus and adnexal areas are otherwise unremarkable except for extensive vascular calcifications in the uterine wall. Other: No free fluid or free air. No abscess or localizing collection. Musculoskeletal: Hemangioma T9 vertebral body. Renal osteodystrophy. No acute or other significant osseous process. IMPRESSION: 1. Increasingly thickened appearance in the ascending, descending and sigmoid colon concerning  for colitis. No pneumatosis is seen.  2. New small layering pleural effusions, interlobular septal lung base edema, and increasing patchy haziness throughout the lung bases consistent with ground-glass edema or pneumonitis. 3. Stable cardiomegaly with small but circumferential pericardial effusion. 4. Cholelithiasis. 5. Hepatosplenomegaly. 6. 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 7. Extensive heavy aortic and branch vessel atherosclerosis. 8. 4 cm partially calcified dorsal uterine fibroid to the right. Aortic Atherosclerosis (ICD10-I70.0). Electronically Signed   By: Denman Fischer M.D.   On: 02/14/2024 00:20   Medications:  clevidipine  21 mg/hr (02/15/24 1000)   fentaNYL  infusion INTRAVENOUS 200 mcg/hr (02/15/24 1000)   piperacillin -tazobactam (ZOSYN )  IV Stopped (02/15/24 0645)   PrismaSol  BGK 2/3.5 600 mL/hr at 02/15/24 0551   PrismaSol  BGK 2/3.5 400 mL/hr at 02/14/24 2106   prismasol  BGK 4/2.5 1,000 mL/hr at 02/15/24 0730   propofol  (DIPRIVAN ) infusion 10 mcg/kg/min (02/15/24 1000)    amLODipine   10 mg Per Tube Daily   Chlorhexidine  Gluconate Cloth  6 each Topical Daily   docusate  100 mg Per Tube BID   doxercalciferol   4 mcg Intravenous Q M,W,F-HD   fentaNYL  (SUBLIMAZE ) injection  25-50 mcg Intravenous Once   insulin  aspart  0-9 Units Subcutaneous Q4H   levETIRAcetam   500 mg Intravenous BID   niMODipine   60 mg Per Tube Q4H   mouth rinse  15 mL Mouth Rinse Q2H   oxyCODONE   5 mg Per Tube Q6H   pantoprazole  (PROTONIX ) IV  40 mg Intravenous QHS   polyethylene glycol  17 g Per Tube Daily   senna-docusate  1 tablet Per Tube BID   sodium chloride  flush  3 mL Intravenous Q12H   sodium chloride  flush  3-10 mL Intravenous Q12H    Dialysis Orders: [facility] MWF 3:45 hrs 180NRe 400/500 57.2 kg 2.0 K/ 2.0 Ca AVF - No heparin  - Hectorol  4 mcg IV three times per week - Mircera 150 mcg IV q 2 weeks (last dose  02/11/2024 next dose due 02/25/2024)  Assessment/Plan:  Altered mental status secondary to ICH complicated by hydrocephalus:  Has drain in now. Management per neuro/CCM.   Acute hypoxic respiratory failure-New pleural effusions/ground glass opacity throughout lung bases noted on CT Ab/pelvis. Concern for volume overload - tolerating UF with CRRT 100-257mL/hr.  Will aim for net neg today.   Hyperkalemia- resolved with CRRT. Cont current fluids.  ESRD - MWF HD typically, current CRRT via temp fem HD catheter  Hypertension/volume  - Chronic difficult to manage HTN. Bp goals per CCM currently.   Anemia  - HGB stable. Recent ESA. Follow labs.   Metabolic bone disease -  Chronic noncompliance with binders. Resume BMD meds/   Nutrition - Albumin OK. 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 Continues on immunosuppressant [place] for pancreas. Followed by Gracelyn Laurence MD 02/15/2024, 10:37 AM  [facility] Pager: [phone]

## 2024-02-16 DIAGNOSIS — G935 Compression of brain: Secondary | ICD-10-CM | POA: Diagnosis not present

## 2024-02-16 DIAGNOSIS — I609 Nontraumatic subarachnoid hemorrhage, unspecified: Secondary | ICD-10-CM | POA: Diagnosis not present

## 2024-02-16 DIAGNOSIS — I161 Hypertensive emergency: Secondary | ICD-10-CM | POA: Diagnosis not present

## 2024-02-16 LAB — RENAL FUNCTION PANEL
Albumin: 3.1 g/dL — ABNORMAL LOW (ref 3.5–5.0)
Albumin: 3.3 g/dL — ABNORMAL LOW (ref 3.5–5.0)
Anion gap: 13 (ref 5–15)
Anion gap: 13 (ref 5–15)
BUN: 13 mg/dL (ref 6–20)
BUN: 20 mg/dL (ref 6–20)
CO2: 23 mmol/L (ref 22–32)
CO2: 21 mmol/L — ABNORMAL LOW (ref 22–32)
Calcium: 9.9 mg/dL (ref 8.9–10.3)
Calcium: 9.9 mg/dL (ref 8.9–10.3)
Chloride: 100 mmol/L (ref 98–111)
Chloride: 100 mmol/L (ref 98–111)
Creatinine, Ser: 2.25 mg/dL — ABNORMAL HIGH (ref 0.44–1.00)
Creatinine, Ser: 2.06 mg/dL — ABNORMAL HIGH (ref 0.44–1.00)
GFR, Estimated: 27 mL/min — ABNORMAL LOW (ref >60)
GFR, Estimated: 30 mL/min — ABNORMAL LOW (ref >60)
Glucose, Bld: 114 mg/dL — ABNORMAL HIGH (ref 70–99)
Glucose, Bld: 144 mg/dL — ABNORMAL HIGH (ref 70–99)
Phosphorus: 3.1 mg/dL (ref 2.5–4.6)
Phosphorus: 3.6 mg/dL (ref 2.5–4.6)
Potassium: 4.1 mmol/L (ref 3.5–5.1)
Potassium: 4.2 mmol/L (ref 3.5–5.1)
Sodium: 136 mmol/L (ref 135–145)
Sodium: 134 mmol/L — ABNORMAL LOW (ref 135–145)

## 2024-02-16 LAB — CBC
HCT: 32.2 % — ABNORMAL LOW (ref 36.0–46.0)
Hemoglobin: 10.2 g/dL — ABNORMAL LOW (ref 12.0–15.0)
MCH: 30.2 pg (ref 26.0–34.0)
MCHC: 31.7 g/dL (ref 30.0–36.0)
MCV: 95.3 fL (ref 80.0–100.0)
Platelets: 136 10*3/uL — ABNORMAL LOW (ref 150–400)
RBC: 3.38 MIL/uL — ABNORMAL LOW (ref 3.87–5.11)
RDW: 13.6 % (ref 11.5–15.5)
WBC: 7.4 10*3/uL (ref 4.0–10.5)
nRBC: 0 % (ref 0.0–0.2)

## 2024-02-16 LAB — GLUCOSE, CAPILLARY
Glucose-Capillary: 122 mg/dL — ABNORMAL HIGH (ref 70–99)
Glucose-Capillary: 146 mg/dL — ABNORMAL HIGH (ref 70–99)
Glucose-Capillary: 152 mg/dL — ABNORMAL HIGH (ref 70–99)
Glucose-Capillary: 157 mg/dL — ABNORMAL HIGH (ref 70–99)
Glucose-Capillary: 179 mg/dL — ABNORMAL HIGH (ref 70–99)
Glucose-Capillary: 97 mg/dL (ref 70–99)

## 2024-02-16 LAB — MAGNESIUM: Magnesium: 2.5 mg/dL — ABNORMAL HIGH (ref 1.7–2.4)

## 2024-02-16 MED ORDER — ORAL CARE MOUTH RINSE
15.0000 mL | OROMUCOSAL | Status: DC
  Administered 2024-02-16 – 2024-02-20 (×18): 15 mL via OROMUCOSAL

## 2024-02-16 MED ORDER — ACETAMINOPHEN 650 MG RE SUPP: 650.0000 mg | Freq: Four times a day (QID) | RECTAL | Status: DC | PRN

## 2024-02-16 MED ORDER — ACETAMINOPHEN 325 MG PO TABS
650.0000 mg | ORAL_TABLET | Freq: Four times a day (QID) | ORAL | Status: DC | PRN
  Administered 2024-02-17 – 2024-02-21 (×12): 650 mg
  Filled 2024-02-16 (×12): qty 2

## 2024-02-16 MED ORDER — LOSARTAN POTASSIUM 50 MG PO TABS
50.0000 mg | ORAL_TABLET | Freq: Every day | ORAL | Status: DC
  Administered 2024-02-16 – 2024-02-21 (×5): 50 mg
  Filled 2024-02-16 (×6): qty 1

## 2024-02-16 NOTE — Progress Notes (Signed)
 Patient ID: Read Camel, female   DOB: 1981/04/15, 43 y.o.   MRN: [id number] IVC draining approximately 8 to 12/h.  Neurologically patient has been stable currently resting while receiving hemodialysis.

## 2024-02-16 NOTE — Progress Notes (Signed)
 OT Cancellation Note  Patient Details Name: [name] [name] MRN: [id number] DOB: [birth year]   Cancelled Treatment:    Reason Eval/Treat Not Completed: Patient not medically ready.  Pt still ventilated and on active bedrest.  Will check back to see on Monday for medical clearance to proceed with OT eval.  Truth Wolaver OTR/L 02/16/2024, 10:05 AM

## 2024-02-16 NOTE — Procedures (Signed)
 Extubation Procedure Note  Patient Details:   Name: [name] [name] DOB: 1981/07/06 MRN: [id number]   Airway Documentation:    Vent end date: 02/16/24 Vent end time: 1446   Evaluation  O2 sats: stable throughout Complications: No apparent complications Patient did tolerate procedure well. Bilateral Breath Sounds: Clear, Diminished (Simultaneous filing. User may not have seen previous data.)   Yes  Pt extubated per order to 3L [place]. Pt had a positive cuff leak, able to state name, weak but productive cough and no stridor to be noted.   Parley Bolls 02/16/2024, 2:59 PM

## 2024-02-16 NOTE — Progress Notes (Signed)
  [facility] Progress Note   Subjective:   seen in ICU - CRRT running well. I/os yest 2.2 / 4.1, net neg 1.9L.  BP 140s on A line.  Plan wean sedation and follow mental status.  EVD remains in place.  Per RN following commands fairly reliably.    Objective Vitals:   02/16/24 0645 02/16/24 0700 02/16/24 0800 02/16/24 0900  BP:  (!) 112/59 (!) 111/59 (!) 93/53  Pulse: 90 90 89 86  Resp: 18 18 18 18   Temp:   97.7 F (36.5 C)   TempSrc:   Axillary   SpO2: 100% 100% 100% 100%  Weight:      Height:       Physical Exam General: ill appearing, intubated and sedated Heart: RRR Lungs: clear on vent Abdomen:soft Extremities: minimal edema Dialysis Access: temp femoral HD catheter, RUE AV access +t/b  Additional Objective Labs: Basic Metabolic Panel: Recent Labs  Lab 02/15/24 0430 02/15/24 1606 02/16/24 0513  NA 140  139 136 136  K 4.6  4.5 4.3 4.1  CL 101  101 101 100  CO2 25  25 24 23   GLUCOSE 86  86 97 114*  BUN 28*  28* 16 13  CREATININE 4.95*  4.87* 3.15* 2.25*  CALCIUM  9.4  9.4 9.4 9.9  PHOS 4.6 2.9 3.1   Liver Function Tests: Recent Labs  Lab 02/13/24 2109 02/14/24 0949 02/15/24 0430 02/15/24 1606 02/16/24 0513  AST 16  --   --   --   --   ALT 11  --   --   --   --   ALKPHOS 92  --   --   --   --   BILITOT 0.8  --   --   --   --   PROT 7.6  --   --   --   --   ALBUMIN 3.9   < > 3.0* 3.0* 3.1*   < > = values in this interval not displayed.   Recent Labs  Lab 02/13/24 2109  LIPASE 35   CBC: Recent Labs  Lab 02/13/24 2109 02/14/24 1243 02/14/24 1716 02/14/24 1741 02/15/24 0430 02/16/24 0513  WBC 4.4 11.2*  --  9.8 7.3 7.4  HGB 11.9* 12.2   < > 10.5* 9.6* 10.2*  HCT 37.3 37.5   < > 32.9* 30.8* 32.2*  MCV 94.7 92.8  --  93.5 97.2 95.3  PLT 91* 109*  --  116*  109* 119* 136*   < > = values in this interval not displayed.   Blood Culture    Component Value Date/Time   SDES BLOOD RIGHT HAND 02/14/2024 0949   SPECREQUEST   02/14/2024 0949    AEROBIC BOTTLE ONLY Blood Culture results may not be optimal due to an inadequate volume of blood received in culture bottles   CULT  02/14/2024 0949    NO GROWTH 2 DAYS Performed at [facility], [address] [place], [name] [address]    REPTSTATUS PENDING 02/14/2024 6045    Cardiac Enzymes: No results for input(s): CKTOTAL, CKMB, CKMBINDEX, TROPONINI in the last 168 hours. CBG: Recent Labs  Lab 02/15/24 1511 02/15/24 1951 02/15/24 2327 02/16/24 0326 02/16/24 0743  GLUCAP 92 98 104* 97 122*   Iron  Studies: No results for input(s): IRON , TIBC, TRANSFERRIN, FERRITIN in the last 72 hours. @lablastinr3 @ Studies/Results: ECHOCARDIOGRAM COMPLETE Result Date: 02/15/2024    ECHOCARDIOGRAM REPORT   Patient Name:   Shalaunda NASEER Elms Date of  Exam: 02/15/2024 Medical Rec #:  [id number]          Height:       67.5 in Accession #:    [id number]         Weight:       123.2 lb Date of Birth:  08/21/1981           BSA:          1.655 m Patient Age:    43 years           BP:           123/63 mmHg Patient Gender: F                  HR:           84 bpm. Exam Location:  Inpatient Procedure: 3D Echo, Cardiac Doppler and Color Doppler (Both Spectral and Color            Flow Doppler were utilized during procedure). Indications:     Stroke  History:         Patient has prior history of Echocardiogram examinations. Risk                  Factors:Hypertension.  Sonographer:     Farrell Honey Key Referring Phys:  [id number] DEVON SHAFER Diagnosing Phys: Pasqual Bone MD IMPRESSIONS  1. Left ventricular ejection fraction, by estimation, is 60 to 65%. The left ventricle has normal function. The left ventricle has no regional wall motion abnormalities. Left ventricular diastolic parameters are consistent with Grade I diastolic dysfunction (impaired relaxation).  2. Right ventricular systolic function is normal. The right ventricular size is normal.  3. Left atrial size was mildly dilated.   4. There is no evidence of cardiac tamponade.  5. The mitral valve is degenerative. Mild mitral valve regurgitation.  6. The aortic valve is tricuspid. There is mild calcification of the aortic valve. There is mild thickening of the aortic valve. Aortic valve regurgitation is not visualized. Aortic valve sclerosis/calcification is present, without any evidence of aortic stenosis.  7. There is mild (Grade II) atheroma plaque involving the aortic root and ascending aorta.  8. The inferior vena cava is normal in size with <50% respiratory variability, suggesting right atrial pressure of 8 mmHg. FINDINGS  Left Ventricle: Left ventricular ejection fraction, by estimation, is 60 to 65%. The left ventricle has normal function. The left ventricle has no regional wall motion abnormalities. The left ventricular internal cavity size was normal in size. There is  borderline concentric left ventricular hypertrophy. Left ventricular diastolic parameters are consistent with Grade I diastolic dysfunction (impaired relaxation). Right Ventricle: The right ventricular size is normal. No increase in right ventricular wall thickness. Right ventricular systolic function is normal. Left Atrium: Left atrial size was mildly dilated. Right Atrium: Right atrial size was normal in size. Pericardium: Trivial pericardial effusion is present. The pericardial effusion is circumferential. There is no evidence of cardiac tamponade. Mitral Valve: The mitral valve is degenerative in appearance. There is moderate thickening of the mitral valve leaflet(s). There is moderate calcification of the mitral valve leaflet(s). Mild to moderate mitral annular calcification. Mild mitral valve regurgitation. Tricuspid Valve: The tricuspid valve is normal in structure. Tricuspid valve regurgitation is trivial. Aortic Valve: The aortic valve is tricuspid. There is mild calcification of the aortic valve. There is mild thickening of the aortic valve. There is mild  aortic valve annular calcification. Aortic valve regurgitation is not visualized. Aortic valve sclerosis/calcification  is present, without any evidence of aortic stenosis. Pulmonic Valve: The pulmonic valve was normal in structure. Pulmonic valve regurgitation is not visualized. Aorta: The aortic root is normal in size and structure. There is mild (Grade II) atheroma plaque involving the aortic root and ascending aorta. Venous: The inferior vena cava is normal in size with less than 50% respiratory variability, suggesting right atrial pressure of 8 mmHg. IAS/Shunts: The atrial septum is grossly normal.  LEFT VENTRICLE PLAX 2D LVIDd:         4.20 cm     Diastology LVIDs:         2.60 cm     LV e' medial:    5.00 cm/s LV PW:         1.00 cm     LV E/e' medial:  23.4 LV IVS:        1.00 cm     LV e' lateral:   5.55 cm/s LVOT diam:     1.40 cm     LV E/e' lateral: 21.1 LV SV:         29 LV SV Index:   17 LVOT Area:     1.54 cm  LV Volumes (MOD) LV vol d, MOD A2C: 80.5 ml LV vol d, MOD A4C: 79.2 ml LV vol s, MOD A2C: 27.1 ml LV vol s, MOD A4C: 28.2 ml LV SV MOD A2C:     53.4 ml LV SV MOD A4C:     79.2 ml LV SV MOD BP:      55.7 ml RIGHT VENTRICLE RV Basal diam:  3.00 cm RV S prime:     11.20 cm/s TAPSE (M-mode): 2.0 cm LEFT ATRIUM             Index        RIGHT ATRIUM           Index LA diam:        2.90 cm 1.75 cm/m   RA Area:     12.40 cm LA Vol (A2C):   39.7 ml 23.98 ml/m  RA Volume:   29.40 ml  17.76 ml/m LA Vol (A4C):   22.6 ml 13.65 ml/m LA Biplane Vol: 30.0 ml 18.12 ml/m  AORTIC VALVE LVOT Vmax:   131.00 cm/s LVOT Vmean:  83.800 cm/s LVOT VTI:    0.188 m  AORTA Ao Root diam: 2.60 cm Ao Asc diam:  3.00 cm MITRAL VALVE MV Area (PHT): 3.68 cm     SHUNTS MV Decel Time: 206 msec     Systemic VTI:  0.19 m MV E velocity: 117.00 cm/s  Systemic Diam: 1.40 cm MV A velocity: 115.00 cm/s MV E/A ratio:  1.02 Pasqual Bone MD Electronically signed by Pasqual Bone MD Signature Date/Time: 02/15/2024/11:13:50 AM    Final     DG Abd Portable 1V Result Date: 02/14/2024 CLINICAL DATA:  NG tube placement EXAM: PORTABLE ABDOMEN - 1 VIEW COMPARISON:  11/05/2023 FINDINGS: Enteric tube tip and side port overlie proximal stomach. Possible partial kink in the tubing at the level of the side port. Nonobstructed gas pattern IMPRESSION: Enteric tube tip and side port overlie proximal stomach. Possible partial kink in the tubing at the level of the side port. Electronically Signed   By: Esmeralda Hedge M.D.   On: 02/14/2024 23:18   CT HEAD POST STROKE FOLLOWUP/TIMED/STAT READ Result Date: 02/14/2024 CLINICAL DATA:  Follow-up examination for neuro deficit, stroke suspected. EXAM: CT HEAD WITHOUT CONTRAST TECHNIQUE: Contiguous axial images were obtained from  the base of the skull through the vertex without intravenous contrast. RADIATION DOSE REDUCTION: This exam was performed according to the departmental dose-optimization program which includes automated exposure control, adjustment of the mA and/or kV according to patient size and/or use of iterative reconstruction technique. COMPARISON:  Comparison made with CTs from earlier the same day. FINDINGS: Brain: IV contrast material on board related to prior CTA, mildly limiting assessment. Large volume clot/hemorrhage involving the prepontine cistern with extension towards the foramen magnum is relatively similar to prior. Scattered subarachnoid blood along the left greater than right sylvian fissures. Intraventricular hemorrhage with blood seen layering in the occipital horns of both lateral ventricles. Overall, degree of hemorrhage is similar to prior. Interval placement of a right frontal approach ventriculostomy with tip in the body of the right lateral ventricle. Persistent hydrocephalus at this time, relatively similar. Chronic left cerebellar infarct noted. No visible acute large vessel territory infarct. No new intracranial hemorrhage. No significant extra-axial collection. Vascular: IV  contrast material seen throughout the intracranial vasculature. Calcified atherosclerosis present at skull base. Skull: Sequelae of right frontal approach ventriculostomy. Sinuses/Orbits: Globes orbital soft tissues within normal limits. Paranasal sinuses are largely clear. Trace right mastoid effusion noted, of doubtful significance. Other: None. IMPRESSION: 1. Interval placement of a right frontal approach ventriculostomy with tip in the body of the right lateral ventricle. Persistent but relatively stable hydrocephalus at this time. 2. Moderate volume acute subarachnoid hemorrhage with associated IVH, similar to prior. No evidence for significant interval bleeding since prior. 3. No other new acute intracranial abnormality. Electronically Signed   By: Virgia Griffins M.D.   On: 02/14/2024 20:07   DG CHEST PORT 1 VIEW Result Date: 02/14/2024 CLINICAL DATA:  Central line placement EXAM: PORTABLE CHEST 1 VIEW COMPARISON:  March 06, 2022 FINDINGS: Tip of the endotracheal tube 1.5 cm from carina Right IJ CVP line tip in the cavoatrial junction Bilateral pulmonary infiltrates may correlate with bilateral pneumonia versus congestive changes or combination of both. No pneumothorax Heart and mediastinum normal without pleural effusions IMPRESSION: *Tip of the endotracheal tube 1.5 cm from carina. *Right IJ CVP line tip in the cavoatrial junction. *Bilateral pulmonary infiltrates may correlate with bilateral pneumonia versus congestive changes or combination of both. Electronically Signed   By: Fredrich Jefferson M.D.   On: 02/14/2024 16:19   CT ANGIO HEAD NECK W WO CM (CODE STROKE) Result Date: 02/14/2024 CLINICAL DATA:  Neuro deficit, concern for stroke, aphasia, arm tremor, mental status change. EXAM: CT ANGIOGRAPHY HEAD AND NECK WITH AND WITHOUT CONTRAST TECHNIQUE: Multidetector CT imaging of the head and neck was performed using the standard protocol during bolus administration of intravenous contrast. Multiplanar  CT image reconstructions and MIPs were obtained to evaluate the vascular anatomy. Carotid stenosis measurements (when applicable) are obtained utilizing NASCET criteria, using the distal internal carotid diameter as the denominator. RADIATION DOSE REDUCTION: This exam was performed according to the departmental dose-optimization program which includes automated exposure control, adjustment of the mA and/or kV according to patient size and/or use of iterative reconstruction technique. CONTRAST:  75mL OMNIPAQUE  IOHEXOL  350 MG/ML SOLN COMPARISON:  Same day CT head. FINDINGS: CTA NECK FINDINGS Aortic arch: Standard configuration of the aortic arch. Imaged portion shows no evidence of aneurysm or dissection. No significant stenosis of the major arch vessel origins. Pulmonary arteries: As permitted by contrast timing, there are no filling defects in the visualized pulmonary arteries. Subclavian arteries: The subclavian arteries are patent bilaterally. Right carotid system: No evidence of dissection,  stenosis (50% or greater), or occlusion. Mild atherosclerosis along the proximal cervical ICA without significant stenosis. Left carotid system: No evidence of dissection, stenosis (50% or greater), or occlusion. Mild atherosclerosis at the carotid bifurcation without significant stenosis. Vertebral arteries: Codominant. No evidence of dissection, stenosis (50% or greater), or occlusion. Atherosclerosis involving the bilateral V4 segments resulting in mild stenosis. Skeleton: No acute or aggressive finding noted. Other neck: The visualized airway is patent. No cervical lymphadenopathy. Upper chest: Patchy and consolidative opacities in the bilateral upper lobes particularly within the dependent aspect as well as within the superior segment of both lower lobes. Review of the MIP images confirms the above findings CTA HEAD FINDINGS ANTERIOR CIRCULATION: The intracranial ICAs are patent bilaterally. Diffuse atherosclerosis of the  bilateral carotid siphons with mild stenosis in the right supraclinoid ICA. No significant stenosis, proximal occlusion, aneurysm, or vascular malformation. MCAs: The middle cerebral arteries are patent bilaterally. ACAs: The anterior cerebral arteries are patent bilaterally. Focal atherosclerosis along the right A3 segment. POSTERIOR CIRCULATION: No significant stenosis, proximal occlusion, aneurysm, or vascular malformation. PCAs: The posterior cerebral arteries are patent bilaterally. Pcomm: Not well visualized. SCAs: The superior cerebellar arteries are patent bilaterally. Basilar artery: The basilar artery is patent. There is subtle irregularity of the vessel without significant narrowing. AICAs: Not well visualized. PICAs: Patent Vertebral arteries: As above. Venous sinuses: As permitted by contrast timing, patent. Anatomic variants: None Review of the MIP images confirms the above findings IMPRESSION: No large vessel occlusion. No high-grade stenosis, aneurysm, or dissection of the arteries in the head and neck. Multifocal atherosclerosis as above. Mild stenosis of the V4 segments of both vertebral arteries. Mild stenosis of the right supraclinoid ICA. Mild irregularity of the basilar artery without significant narrowing. Finding concerning for mild vasospasm. Consolidative and patchy opacities in the bilateral upper lobes and superior segments of the bilateral lower lobes. Recommend correlation with dedicated chest imaging. Impression points #1-3 were called by telephone at the time of interpretation on 02/14/2024 at 2:26 pm to provider Dr. Renaee Caro, who verbally acknowledged these results. Electronically Signed   By: Denny Flack M.D.   On: 02/14/2024 14:43   CT HEAD CODE STROKE WO CONTRAST Result Date: 02/14/2024 CLINICAL DATA:  Code stroke. Neuro deficit, concern for stroke, altered mental status and aphasia. EXAM: CT HEAD WITHOUT CONTRAST TECHNIQUE: Contiguous axial images were obtained from the base  of the skull through the vertex without intravenous contrast. RADIATION DOSE REDUCTION: This exam was performed according to the departmental dose-optimization program which includes automated exposure control, adjustment of the mA and/or kV according to patient size and/or use of iterative reconstruction technique. COMPARISON:  CT head 11/05/2023. FINDINGS: Brain: Large focus of extra-axial hemorrhage filling the prepontine cistern extending inferiorly to the foramen magnum and into the cervical spinal canal. Confluent region of hemorrhage in the prepontine cistern measures 4.1 x 2.0 cm in axial dimensions with craniocaudal extent of at least 5.2 cm to the level of the foramen magnum. There is mass effect on the brainstem. Additional subdural hemorrhage along the posterior falx and tentorial leaflets. Scattered areas of subarachnoid hemorrhage particularly along the left sylvian fissure. There are intraventricular blood products layering in both occipital horns. Encephalomalacia in the inferior medial left cerebellum concerning for remote infarct. No midline shift. Ventricles: Prominence of the ventricles concerning for developing communicating hydrocephalus. Intraventricular blood products as above. Vascular: Atherosclerotic calcifications of the carotid siphons and intracranial vertebral arteries. No hyperdense vessel. Skull: No acute or aggressive finding. Orbits: Orbits  are symmetric. Sinuses: The visualized paranasal sinuses are clear. Other: Mastoid air cells are clear. ASPECTS [facility] Stroke Program Early CT Score) - Ganglionic level infarction (caudate, lentiform nuclei, internal capsule, insula, M1-M3 cortex): 7 - Supraganglionic infarction (M4-M6 cortex): 3 Total score (0-10 with 10 being normal): 10 IMPRESSION: 1. Large multi compartment intracranial hemorrhage as described above. Dominant focus of hemorrhage involving the prepontine cistern extending into the upper cervical spinal canal. 2.  Intraventricular blood products layering in the occipital horns. 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 3. No midline shift. 4. ASPECTS is 10 These results were communicated to Dr. Lindzen at 2:13 pm on 02/14/2024 by text page via the [facility] messaging system. Electronically Signed   By: Denny Flack M.D.   On: 02/14/2024 14:13   Medications:  clevidipine  Stopped (02/16/24 0844)   feeding supplement (OSMOLITE 1.5 CAL) 40 mL/hr at 02/16/24 0900   fentaNYL  infusion INTRAVENOUS Stopped (02/16/24 0857)   piperacillin -tazobactam (ZOSYN )  IV Stopped (02/16/24 0542)   PrismaSol  BGK 2/3.5 600 mL/hr at 02/16/24 0725   PrismaSol  BGK 2/3.5 400 mL/hr at 02/15/24 2245   prismasol  BGK 4/2.5 1,000 mL/hr at 02/16/24 0906   propofol  (DIPRIVAN ) infusion Stopped (02/15/24 1557)    amLODipine   10 mg Per Tube Daily   carvedilol   25 mg Per Tube BID WC   Chlorhexidine  Gluconate Cloth  6 each Topical Daily   cycloSPORINE   25 mg Per Tube Daily   docusate  100 mg Per Tube BID   doxercalciferol   4 mcg Intravenous Q M,W,F-HD   feeding supplement (PROSource TF20)  60 mL Per Tube BID   fentaNYL  (SUBLIMAZE ) injection  25-50 mcg Intravenous Once   insulin  aspart  0-9 Units Subcutaneous Q4H   levETIRAcetam   500 mg Intravenous BID   mycophenolate   500 mg Per Tube BID   niMODipine   60 mg Per Tube Q4H   mouth rinse  15 mL Mouth Rinse Q2H   oxyCODONE   5 mg Per Tube Q6H   polyethylene glycol  17 g Per Tube Daily   predniSONE   15 mg Per Tube Q breakfast   senna-docusate  1 tablet Per Tube BID   sodium chloride  flush  3 mL Intravenous Q12H   sodium chloride  flush  3-10 mL Intravenous Q12H    Dialysis Orders: [facility] MWF 3:45 hrs 180NRe 400/500 57.2 kg 2.0 K/ 2.0 Ca AVF - No heparin  - Hectorol  4 mcg IV three times per week - Mircera 150 mcg IV q 2 weeks (last dose 02/11/2024 next dose due 02/25/2024)  Assessment/Plan:  Altered mental status secondary to ICH complicated  by hydrocephalus:  Has drain in now. Management per neuro/CCM. Sedation being weaned.   Acute hypoxic respiratory failure-New pleural effusions/ground glass opacity throughout lung bases noted on CT Ab/pelvis. Concern for volume overload - tolerating UF with CRRT neg 62mL/hr.  D/w RN if BP trending down decrease to net even.   Hyperkalemia- resolved with CRRT. Cont current fluids.  ESRD - MWF HD typically, current CRRT via temp fem HD catheter  Hypertension/volume  - Chronic difficult to manage HTN. Bp goals per CCM currently.   Anemia  - HGB stable 10s. Recent ESA. Follow labs.   Metabolic bone disease -  Chronic noncompliance with binders. Resume BMD meds/   Nutrition - Albumin OK. Enteral nutrition currently.   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 Continues on immunosuppressant [place] for pancreas.  Pharm provided liquid dosing equivalents with NGT.  Followed by [facility].  Adrian Alba MD 02/16/2024, 9:29 AM  [facility] Pager: [phone]

## 2024-02-16 NOTE — Progress Notes (Signed)
 SLP Cancellation Note  Patient Details Name: [name] [name] MRN: [id number] DOB: July 17, 1981   Cancelled treatment:       Reason Eval/Treat Not Completed: Patient not medically ready. Pt remains ventilated. Will continue f/u.     Gordon Latus, MA, CCC-SLP Acute Rehabilitation Services Office Number: [phone]  [name] [name] 02/16/2024, 11:37 AM

## 2024-02-16 NOTE — Progress Notes (Signed)
 PT Cancellation Note  Patient Details Name: [name] [name] MRN: [id number] DOB: 1981/03/04   Cancelled Treatment:    Reason Eval/Treat Not Completed: (P) Active bedrest order. RN also requesting hold off on PT today. Will plan to follow-up another day as able.   Vernida Goodie, PT, DPT Acute Rehabilitation Services  Office: [phone]    Ellyn Hack 02/16/2024, 9:23 AM

## 2024-02-16 NOTE — Plan of Care (Signed)
  Problem: Clinical Measurements: Goal: Ability to maintain clinical measurements within normal limits will improve Outcome: Progressing Goal: Respiratory complications will improve Outcome: Progressing   Problem: Nutrition: Goal: Adequate nutrition will be maintained Outcome: Progressing

## 2024-02-16 NOTE — Progress Notes (Signed)
 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 Hypertensive in ED SBP 240s. Went for CT a/p and was c/f some colitis. Started on abx, given IVF. C/o pain and was very agitated, rcvd pain meds. Admitted 6/12. Over the course of the day she became less responsive. Remained hypertensive w SBPs >200. Incr O2 req to [facility]. Pt was to rcv HD 6/12 afternoon but when eval by HD NP, concerns for airway protection. Rcvd narcan  with only modest improvement. PCCM consulted in this setting    Pertinent  Medical History  Failed renal / pancreas txp ESRD  HTN DM1 gastroparesis   Significant Hospital Events: Including procedures, antibiotic start and stop dates in addition to other pertinent events   6/11 ED w n/v AMS. Abx IVF for colitis 6/12 admit. AMS decr LOC. Incr O2 req. PCCM consult and code stroke called seen w rapid response nephro NP then neuro  6/13 intubated with EVD, responding to commands   Interim History / Subjective:  No overnight issues. Off cleviprex . Still on CRRT. On fentanyl  and appears comfortable with improved pain control  Objective    Blood pressure (!) 93/53, pulse 86, temperature 97.7 F (36.5 C), temperature source Axillary, resp. rate 18, height 5' 7.5 (1.715 m), weight 55 kg, last menstrual period 02/13/2024, SpO2 100%.    Vent Mode: PRVC FiO2 (%):  [40 %] 40 % Set Rate:  [18 bmp] 18 bmp Vt Set:  [500 mL] 500 mL PEEP:  [5 cmH20] 5 cmH20 Plateau Pressure:  [19 cmH20-21 cmH20] 19 cmH20   Intake/Output Summary (Last 24 hours) at 02/16/2024 1914 Last data filed at 02/16/2024 0913 Gross per 24 hour  Intake 2469 ml  Output 4197.6 ml  Net -1728.6 ml   Filed  Weights   02/13/24 2007 02/15/24 0400 02/16/24 0500  Weight: 59.3 kg 55.9 kg 55 kg    Examination: Gen:      Intubated, sedated, acutely and chronically ill appearing HEENT:  ETT to vent Lungs:    sounds of mechanical ventilation auscultated no wheeze CV:         RRR Abd:      Soft, nontender Ext:    No edema Skin:      Warm and dry; no rashes Neuro:   sedated on fentanyl  but awakens to touch and follows commands, left sided hemiparesis  Labs and imaging reviewed Na 136 K 4.1 Cr 2.25     Resolved problem list   Assessment and Plan   Large intracranial hemorrhage with brain compression -ICH score 4 Scattered SAH  Communicating hydrocephalus  Hypertensive emergency  Hx prior ischemic stroke  - -CTH with large multi-compartment ICH with EVD placement and 10-20cc/hr output -NSGY following  -continue home amlodipine , off cleviprex  this am -continue Keppra  and Nimodipine   Acute respiratory failure w hypoxia Aspiration PNA  Pulmonary edema  -intubated, failed SBT --Maintain full vent support with SAT/SBT as tolerated -titrate Vent setting to maintain SpO2 greater than or equal to 90%. -HOB elevated 30 degrees. -Plateau pressures less than 30 cm H20.  -Follow chest x-ray, ABG prn.   -Bronchial hygiene and RT/bronchodilator protocol. -start oxy and continue  fentanyl  and propofol  for PAD protocol   ESRD on HD  Hyperkalemia  Hx failed renal txp & s/p pancreatic txp (2014) on chronic immunosuppression - continue RRT, will discuss transition to [facility] with nephrology when appropriate  DM1 (sp pancreas txp) - monitor CBGs, SSI -continue mycophenolate  and pred 1  Colitis  -continue Zosyn  for colitis total 5 day course   Best Practice (right click and Reselect all SmartList Selections daily)   Diet/type: NPO with tube feeds DVT prophylaxis SCD Pressure ulcer(s): pressure ulcer assessment deferred  GI prophylaxis: PPI Lines: N/A Foley:  N/A Code Status:  full  code Last date of multidisciplinary goals of care discussion [[place] - no one has called in. There is a friend listed, will attempt to contact and update today]  Labs   CBC: Recent Labs  Lab 02/13/24 2109 02/14/24 1243 02/14/24 1716 02/14/24 1741 02/15/24 0430 02/16/24 0513  WBC 4.4 11.2*  --  9.8 7.3 7.4  HGB 11.9* 12.2 10.2* 10.5* 9.6* 10.2*  HCT 37.3 37.5 30.0* 32.9* 30.8* 32.2*  MCV 94.7 92.8  --  93.5 97.2 95.3  PLT 91* 109*  --  116*  109* 119* 136*    Basic Metabolic Panel: Recent Labs  Lab 02/14/24 0949 02/14/24 1716 02/14/24 1741 02/15/24 0430 02/15/24 1606 02/16/24 0513  NA 139 137 139 140  139 136 136  K 6.1* 5.5* 5.4* 4.6  4.5 4.3 4.1  CL 97*  --  96* 101  101 101 100  CO2 25  --  26 25  25 24 23   GLUCOSE 130*  --  128* 86  86 97 114*  BUN 36*  --  39* 28*  28* 16 13  CREATININE 6.33*  --  6.89* 4.95*  4.87* 3.15* 2.25*  CALCIUM  9.6  --  9.2 9.4  9.4 9.4 9.9  MG  --   --   --  2.4  --  2.5*  PHOS 6.1*  --   --  4.6 2.9 3.1   GFR: Estimated Creatinine Clearance: 28.3 mL/min (A) (by C-G formula based on SCr of 2.25 mg/dL (H)). Recent Labs  Lab 02/14/24 0949 02/14/24 1243 02/14/24 1741 02/15/24 0430 02/16/24 0513  WBC  --  11.2* 9.8 7.3 7.4  LATICACIDVEN 1.4 1.3  --   --   --     Liver Function Tests: Recent Labs  Lab 02/13/24 2109 02/14/24 0949 02/15/24 0430 02/15/24 1606 02/16/24 0513  AST 16  --   --   --   --   ALT 11  --   --   --   --   ALKPHOS 92  --   --   --   --   BILITOT 0.8  --   --   --   --   PROT 7.6  --   --   --   --   ALBUMIN 3.9 3.7 3.0* 3.0* 3.1*   Recent Labs  Lab 02/13/24 2109  LIPASE 35   No results for input(s): AMMONIA in the last 168 hours.  ABG    Component Value Date/Time   PHART 7.444 02/14/2024 1716   PCO2ART 44.3 02/14/2024 1716   PO2ART 339 (H) 02/14/2024 1716   HCO3 30.3 (H) 02/14/2024 1716   TCO2 32 02/14/2024 1716   ACIDBASEDEF 5.2 (H) 01/13/2015 0300    O2SAT 100 02/14/2024 1716     Coagulation Profile: Recent Labs  Lab 02/14/24 1741  INR 1.3*  Cardiac Enzymes: No results for input(s): CKTOTAL, CKMB, CKMBINDEX, TROPONINI in the last 168 hours.  HbA1C: Hgb A1c MFr Bld  Date/Time Value Ref Range Status  02/14/2024 09:49 AM 4.2 (L) 4.8 - 5.6 % Final    Comment:    (NOTE) Diagnosis of Diabetes The following HbA1c ranges recommended by the American Diabetes Association (ADA) may be used as an aid in the diagnosis of diabetes mellitus.  Hemoglobin             Suggested A1C NGSP%              Diagnosis  <5.7                   Non Diabetic  5.7-6.4                Pre-Diabetic  >6.4                   Diabetic  <7.0                   Glycemic control for                       adults with diabetes.    11/11/2021 02:25 AM 4.1 (L) 4.8 - 5.6 % Final    Comment:    (NOTE) Pre diabetes:          5.7%-6.4%  Diabetes:              >6.4%  Glycemic control for   <7.0% adults with diabetes     CBG: Recent Labs  Lab 02/15/24 1511 02/15/24 1951 02/15/24 2327 02/16/24 0326 02/16/24 0743  GLUCAP 92 98 104* 97 122*    Critical care time:    CRITICAL CARE  The patient is critically ill due to hypertensive crisis, ICU respiratory failure.  Critical care was necessary to treat or prevent imminent or life-threatening deterioration.  Critical care was time spent personally by me on the following activities: development of treatment plan with patient and/or surrogate as well as nursing, discussions with consultants, evaluation of patient's response to treatment, examination of patient, obtaining history from patient or surrogate, ordering and performing treatments and interventions, ordering and review of laboratory studies, ordering and review of radiographic studies, pulse oximetry, re-evaluation of patient's condition and participation in multidisciplinary rounds.   Critical Care Time devoted to patient care services  described in this note is 40 minutes. This time reflects time of care of this signee Mong Neal S Stanislaw Acton . This critical care time does not reflect separately billable procedures or procedure time, teaching time or supervisory time of PA/NP/Med student/Med Resident etc but could involve care discussion time.       Aleck Hurdle [facility] 02/16/2024 9:29 AM  Pager: see AMION  If no response to pager , please call critical care on call (see AMION) until 7pm After 7:00 pm call Elink

## 2024-02-16 NOTE — Progress Notes (Signed)
 STROKE TEAM PROGRESS NOTE    SIGNIFICANT HOSPITAL EVENTS 6/11- ED for nausea, vomiting after dialysis 6/12- intubated, EVD placed   INTERIM HISTORY/SUBJECTIVE  Patient seen and evaluated this morning. No acute overnight event noted. EVD in place, still on fentanyl  and propofol . She is receiving CRRT   OBJECTIVE  CBC    Component Value Date/Time   WBC 7.4 02/16/2024 0513   RBC 3.38 (L) 02/16/2024 0513   HGB 10.2 (L) 02/16/2024 0513   HCT 32.2 (L) 02/16/2024 0513   PLT 136 (L) 02/16/2024 0513   MCV 95.3 02/16/2024 0513   MCH 30.2 02/16/2024 0513   MCHC 31.7 02/16/2024 0513   RDW 13.6 02/16/2024 0513   LYMPHSABS 1.0 11/05/2023 0343   MONOABS 0.2 11/05/2023 0343   EOSABS 0.1 11/05/2023 0343   BASOSABS 0.0 11/05/2023 0343    BMET    Component Value Date/Time   NA 136 02/16/2024 0513   K 4.1 02/16/2024 0513   CL 100 02/16/2024 0513   CO2 23 02/16/2024 0513   GLUCOSE 114 (H) 02/16/2024 0513   BUN 13 02/16/2024 0513   CREATININE 2.25 (H) 02/16/2024 0513   CALCIUM  9.9 02/16/2024 0513   GFRNONAA 27 (L) 02/16/2024 0513    IMAGING past 24 hours No results found.   Vitals:   02/16/24 0800 02/16/24 0900 02/16/24 1000 02/16/24 1100  BP: (!) 111/59 (!) 93/53 109/61 (!) 109/54  Pulse: 89 86 84 85  Resp: 18 18 18 18   Temp: 97.7 F (36.5 C)     TempSrc: Axillary     SpO2: 100% 100% 100% 99%  Weight:      Height:         PHYSICAL EXAM General: Intubated, sedated  CV: Regular rate and rhythm on monitor Respiratory: Mechanically ventilated  GI: Abdomen soft and nontender   NEURO:  Mental status: intubated on sedation, eyes closed, but will open eyes and follow simple commands with sedation turned off. PERRL.  gag and cough present. Breathing over the vent.   Facial symmetry not able to test due to ET tube.  Tongue protrusion not cooperative.  Able to lift against gravity RUE and RLE. Purposeful movement of the RUE  LUE drift and unable to lift LLE of the bed.   Sensation, coordination and gait not tested.   ASSESSMENT/PLAN  [name] is a 43 y.o. female with history of hypertension, ESRD on HD, s/p renal and pancreatic transplant, diabetes mellitus type 1 with gastroparesis presents with nausea and vomiting since after dialysis.  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 Intraventricular blood products layering in the occipital horns.  CTA head & neck - No large vessel occlusion. No high-grade stenosis, aneurysm, or dissection of the arteries in the head and neck. Mild stenosis of the V4 segments of both vertebral arteries. Mild stenosis of the right supraclinoid ICA.  6/12 repeat CT Head- Interval placement of a right frontal approach ventriculostomy with tip in the body of the right lateral ventricle. Persistent but relatively stable hydrocephalus at this time. Moderate volume acute subarachnoid hemorrhage with associated IVH, similar to prior. No evidence for significant interval bleeding since prior. MRI Pending  2D Echo EF 60-65%, LA dilated  Recommend cerebral angiogram once stable to rule out prepontine vascular anomaly LDL 63 HgbA1c 4.2 VTE prophylaxis - SCDs No antithrombotic prior to  admission, now on No antithrombotic  Therapy recommendations:  Pending Disposition:  Pending   Cerebral edema with brain compression Hydrocephalous s/p EVD CT head Increased caliber of the ventricles concerning for developing communicating hydrocephalus.  NSGY consulted EVD placement 6/12 On Keppra   ? Vasospasm  CTA head and neck Mild irregularity of the basilar artery without significant narrowing. Finding concerning for mild vasospasm. TCD monitoring pending On nimodipine    Respiratory failure  Aspiration pneumonia PCCM primary Intubated 6/12 Sedated with propofol ,  fentanyl  On Zosyn   Hypertensive Emergency  Home meds:  Norvasc , coreg , clonidine  patch, hydralazine   Unstable Cleviprex , labetalol  PRN BP goal < 160 Long term BP goal normotensive  Diabetes Type 1 Hx of pancreas transplant  HgbA1c 4.2, goal < 7.0 CBGs, SSI  ESRD on HD Hx of renal transplant  HD patient with fistula Nephrology on board Now on CRRT   Dysphagia NG tube in place Speech therapy consult Nutrition consult for tube feeds  Other stroke risk factors   Other medical issues DM gastroparesis  Colitis  Thrombocytopenia - platelet 109  Hospital day # 2  Cassandra Cleveland, MD Neurology 02/16/2024 11:26 AM  This patient is critically ill due to [facility] and IVH, hydrocephalus, respirator failure and at significant risk of neurological worsening, death form vasospasm, obstructive hydrocephalus, renal failure, sepsis. This patient's care requires constant monitoring of vital signs, hemodynamics, respiratory and cardiac monitoring, review of multiple databases, neurological assessment, discussion with family, other specialists and medical decision making of high complexity. I spent 40 minutes of neurocritical care time in the care of this patient.     To contact Stroke Continuity provider, please refer to [email or web address]. After hours, contact General Neurology

## 2024-02-17 ENCOUNTER — Inpatient Hospital Stay (HOSPITAL_COMMUNITY)

## 2024-02-17 DIAGNOSIS — I609 Nontraumatic subarachnoid hemorrhage, unspecified: Secondary | ICD-10-CM | POA: Diagnosis not present

## 2024-02-17 DIAGNOSIS — G935 Compression of brain: Secondary | ICD-10-CM | POA: Diagnosis not present

## 2024-02-17 DIAGNOSIS — I161 Hypertensive emergency: Secondary | ICD-10-CM | POA: Diagnosis not present

## 2024-02-17 LAB — RENAL FUNCTION PANEL
Albumin: 3.4 g/dL — ABNORMAL LOW (ref 3.5–5.0)
Albumin: 3.4 g/dL — ABNORMAL LOW (ref 3.5–5.0)
Anion gap: 11 (ref 5–15)
Anion gap: 15 (ref 5–15)
BUN: 20 mg/dL (ref 6–20)
BUN: 35 mg/dL — ABNORMAL HIGH (ref 6–20)
CO2: 22 mmol/L (ref 22–32)
CO2: 18 mmol/L — ABNORMAL LOW (ref 22–32)
Calcium: 10.7 mg/dL — ABNORMAL HIGH (ref 8.9–10.3)
Calcium: 10.8 mg/dL — ABNORMAL HIGH (ref 8.9–10.3)
Chloride: 102 mmol/L (ref 98–111)
Chloride: 101 mmol/L (ref 98–111)
Creatinine, Ser: 1.93 mg/dL — ABNORMAL HIGH (ref 0.44–1.00)
Creatinine, Ser: 2.71 mg/dL — ABNORMAL HIGH (ref 0.44–1.00)
GFR, Estimated: 33 mL/min — ABNORMAL LOW (ref >60)
GFR, Estimated: 22 mL/min — ABNORMAL LOW (ref >60)
Glucose, Bld: 143 mg/dL — ABNORMAL HIGH (ref 70–99)
Glucose, Bld: 163 mg/dL — ABNORMAL HIGH (ref 70–99)
Phosphorus: 3.4 mg/dL (ref 2.5–4.6)
Phosphorus: 3.2 mg/dL (ref 2.5–4.6)
Potassium: 3.9 mmol/L (ref 3.5–5.1)
Potassium: 4.2 mmol/L (ref 3.5–5.1)
Sodium: 135 mmol/L (ref 135–145)
Sodium: 134 mmol/L — ABNORMAL LOW (ref 135–145)

## 2024-02-17 LAB — CBC
HCT: 34.6 % — ABNORMAL LOW (ref 36.0–46.0)
Hemoglobin: 11.4 g/dL — ABNORMAL LOW (ref 12.0–15.0)
MCH: 31 pg (ref 26.0–34.0)
MCHC: 32.9 g/dL (ref 30.0–36.0)
MCV: 94 fL (ref 80.0–100.0)
Platelets: 156 10*3/uL (ref 150–400)
RBC: 3.68 MIL/uL — ABNORMAL LOW (ref 3.87–5.11)
RDW: 13.6 % (ref 11.5–15.5)
WBC: 9 10*3/uL (ref 4.0–10.5)
nRBC: 0 % (ref 0.0–0.2)

## 2024-02-17 LAB — GLUCOSE, CAPILLARY
Glucose-Capillary: 124 mg/dL — ABNORMAL HIGH (ref 70–99)
Glucose-Capillary: 131 mg/dL — ABNORMAL HIGH (ref 70–99)
Glucose-Capillary: 147 mg/dL — ABNORMAL HIGH (ref 70–99)
Glucose-Capillary: 183 mg/dL — ABNORMAL HIGH (ref 70–99)

## 2024-02-17 LAB — PHOSPHORUS: Phosphorus: 3.4 mg/dL (ref 2.5–4.6)

## 2024-02-17 LAB — MAGNESIUM: Magnesium: 2.8 mg/dL — ABNORMAL HIGH (ref 1.7–2.4)

## 2024-02-17 MED ORDER — CARVEDILOL 12.5 MG PO TABS
12.5000 mg | ORAL_TABLET | Freq: Two times a day (BID) | ORAL | Status: DC
  Administered 2024-02-17 – 2024-02-18 (×3): 12.5 mg
  Filled 2024-02-17 (×4): qty 1

## 2024-02-17 MED ORDER — PIPERACILLIN-TAZOBACTAM IN DEX 2-0.25 GM/50ML IV SOLN
2.2500 g | Freq: Three times a day (TID) | INTRAVENOUS | Status: AC
  Administered 2024-02-17 – 2024-02-21 (×13): 2.25 g via INTRAVENOUS
  Filled 2024-02-17 (×15): qty 50

## 2024-02-17 NOTE — Progress Notes (Signed)
 Patient ID: [name] [name], female   DOB: Aug 30, 1981, 43 y.o.   MRN: [id number] No significant events overnight IVC drainage seems to be slowing between 5 and 10 cc/h For MRI today Continue to follow clinically

## 2024-02-17 NOTE — Progress Notes (Signed)
 STROKE TEAM PROGRESS NOTE    SIGNIFICANT HOSPITAL EVENTS 6/11- ED for nausea, vomiting after dialysis 6/12- intubated, EVD placed   INTERIM HISTORY/SUBJECTIVE  Patient seen and evaluated this morning. No acute overnight event noted. She was extubated yesterday. EVD in place, still on fentanyl  and propofol . She is receiving CRRT. Plan for MRI later today   OBJECTIVE  CBC    Component Value Date/Time   WBC 9.0 02/17/2024 0928   RBC 3.68 (L) 02/17/2024 0928   HGB 11.4 (L) 02/17/2024 0928   HCT 34.6 (L) 02/17/2024 0928   PLT 156 02/17/2024 0928   MCV 94.0 02/17/2024 0928   MCH 31.0 02/17/2024 0928   MCHC 32.9 02/17/2024 0928   RDW 13.6 02/17/2024 0928   LYMPHSABS 1.0 11/05/2023 0343   MONOABS 0.2 11/05/2023 0343   EOSABS 0.1 11/05/2023 0343   BASOSABS 0.0 11/05/2023 0343    BMET    Component Value Date/Time   NA 135 02/17/2024 0419   K 3.9 02/17/2024 0419   CL 102 02/17/2024 0419   CO2 22 02/17/2024 0419   GLUCOSE 143 (H) 02/17/2024 0419   BUN 20 02/17/2024 0419   CREATININE 1.93 (H) 02/17/2024 0419   CALCIUM  10.7 (H) 02/17/2024 0419   GFRNONAA 33 (L) 02/17/2024 0419    IMAGING past 24 hours No results found.   Vitals:   02/17/24 0857 02/17/24 1000 02/17/24 1100 02/17/24 1200  BP:  98/62 123/64 104/70  Pulse:    (!) 109  Resp:  (!) 27 (!) 26 (!) 30  Temp: 99.4 F (37.4 C)  99.3 F (37.4 C)   TempSrc: Oral  Oral   SpO2:    98%  Weight:      Height:         PHYSICAL EXAM General: Lethargic sedated but will open eyes to voice CV: Regular rate and rhythm on monitor Respiratory: Mechanically ventilated  GI: Abdomen soft and nontender   NEURO:  Mental status: intubated on sedation, eyes closed, but will open eyes and follow simple commands with sedation turned off. Disconjugate pupils gag and cough present.  Tongue protrusion not cooperative.  Able to lift against gravity RUE and RLE. Purposeful movement of the RUE  LUE drift and unable to lift LLE  of the bed.  Sensation, coordination and gait not tested.   ASSESSMENT/PLAN  [name] [name] is a 43 y.o. female with history of hypertension, ESRD on HD, s/p renal and pancreatic transplant, diabetes mellitus type 1 with gastroparesis presents with nausea and vomiting since after dialysis.  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 Intraventricular blood products layering in the occipital horns.  CTA head & neck - No large vessel occlusion. No high-grade stenosis, aneurysm, or dissection of the arteries in the head and neck. Mild stenosis of the V4 segments of both vertebral arteries. Mild stenosis of the right supraclinoid ICA.  6/12 repeat CT Head- Interval placement of a right frontal approach ventriculostomy with tip in the body of the right lateral ventricle. Persistent but relatively stable hydrocephalus at this time. Moderate volume acute subarachnoid hemorrhage with associated IVH, similar to prior. No evidence for significant interval bleeding since prior. MRI completed, pending radiology report 2D Echo EF 60-65%, LA dilated  Recommend cerebral angiogram once stable to rule out prepontine vascular anomaly LDL 63 HgbA1c 4.2 VTE prophylaxis - SCDs  No antithrombotic prior to admission, now on No antithrombotic  Therapy recommendations:  Pending Disposition:  Pending   Cerebral edema with brain compression Hydrocephalous s/p EVD CT head Increased caliber of the ventricles concerning for developing communicating hydrocephalus.  NSGY consulted EVD placement 6/12 On Keppra   ? Vasospasm  CTA head and neck Mild irregularity of the basilar artery without significant narrowing. Finding concerning for mild vasospasm. TCD monitoring pending On nimodipine    Respiratory failure  Aspiration pneumonia PCCM  primary Intubated 6/12 Sedated with propofol , fentanyl  On Zosyn   Hypertensive Emergency  Home meds:  Norvasc , coreg , clonidine  patch, hydralazine   Unstable Cleviprex , labetalol  PRN BP goal < 160 Long term BP goal normotensive  Diabetes Type 1 Hx of pancreas transplant  HgbA1c 4.2, goal < 7.0 CBGs, SSI  ESRD on HD Hx of renal transplant  HD patient with fistula Nephrology on board Now on CRRT   Dysphagia NG tube in place Speech therapy consult Nutrition consult for tube feeds  Other stroke risk factors   Other medical issues DM gastroparesis  Colitis  Thrombocytopenia - platelet 109  Hospital day # 3  Cassandra Cleveland, MD Neurology 02/17/2024 1:14 PM   This patient is critically ill due to [facility] and IVH, hydrocephalus, respirator failure and at significant risk of neurological worsening, death form vasospasm, obstructive hydrocephalus, renal failure, sepsis. This patient's care requires constant monitoring of vital signs, hemodynamics, respiratory and cardiac monitoring, review of multiple databases, neurological assessment, discussion with family, other specialists and medical decision making of high complexity. I spent 40 minutes of neurocritical care time in the care of this patient.    To contact Stroke Continuity provider, please refer to [email or web address]. After hours, contact General Neurology

## 2024-02-17 NOTE — Progress Notes (Signed)
 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 Hypertensive in ED SBP 240s. Went for CT a/p and was c/f some colitis. Started on abx, given IVF. C/o pain and was very agitated, rcvd pain meds. Admitted 6/12. Over the course of the day she became less responsive. Remained hypertensive w SBPs >200. Incr O2 req to [facility]. Pt was to rcv HD 6/12 afternoon but when eval by HD NP, concerns for airway protection. Rcvd narcan  with only modest improvement. PCCM consulted in this setting    Pertinent  Medical History  Failed renal / pancreas txp ESRD  HTN DM1 gastroparesis   Significant Hospital Events: Including procedures, antibiotic start and stop dates in addition to other pertinent events   6/11 ED w n/v AMS. Abx IVF for colitis 6/12 admit. AMS decr LOC. Incr O2 req. PCCM consult and code stroke called seen w rapid response nephro NP then neuro  6/13 intubated with EVD, responding to commands  6/14 extubated  Interim History / Subjective:  No overnight issues. Tolerated extubation. Respiratory status stable but a little lethargic. Tolerating CRRT.   Objective    Blood pressure (!) 122/56, pulse 100, temperature 99.4 F (37.4 C), temperature source Oral, resp. rate (!) 24, height 5' 7.5 (1.715 m), weight 54.8 kg, last menstrual period 02/13/2024, SpO2 100%.    Vent Mode: CPAP;PSV FiO2 (%):  [40 %] 40 % PEEP:  [5 cmH20] 5 cmH20 Pressure Support:  [5 cmH20-10 cmH20] 5 cmH20   Intake/Output Summary (Last 24 hours) at 02/17/2024 0902 Last data filed at 02/17/2024 4098 Gross per 24 hour  Intake 2013.4 ml  Output 3545 ml  Net -1531.6 ml   Filed Weights   02/15/24 0400 02/16/24  0500 02/17/24 0418  Weight: 55.9 kg 55 kg 54.8 kg    Examination: Acutely ill appearing, laying flat on nasal cannula Breathing non labored Tachycardic, systolic murmur On CRRT Opens eyes to voice, follows commands intermittently, right sided hemiparesis   Labs and imaging reviewed Na 135 K 3.9 Cr 1.93 BUN 20 Calcium  10.7     Resolved problem list   Assessment and Plan   Large intracranial hemorrhage with brain compression -ICH score 4 Scattered SAH  Communicating hydrocephalus  Hypertensive emergency  Hx prior ischemic stroke  - CTH with large multi-compartment ICH with EVD placement and 10-20cc/hr output -NSGY following  - BP controlled off cleviprex  -continue Keppra  and Nimodipine   Acute respiratory failure w hypoxia Aspiration PNA  Pulmonary edema  - extubated 6/14. Continue zosyn  to cover aspiration and colitis  Colitis  -continue Zosyn  for colitis total 5 day course  ESRD on HD  Hyperkalemia  Hx failed renal txp & s/p pancreatic txp (2014) on chronic immunosuppression - continue RRT, will discuss transition to [facility] with nephrology when appropriate  DM1 (sp pancreas txp) - monitor CBGs, SSI -continue mycophenolate  and pred 1   Best Practice (right click and Reselect all SmartList Selections daily)   Diet/type: NPO with tube feeds DVT prophylaxis SCD Pressure ulcer(s): pressure ulcer assessment deferred  GI prophylaxis: PPI Lines: N/A Foley:  N/A Code Status:  full code Last date of multidisciplinary goals of care  discussion [friend updated 6/12. Patient[place] ]  Labs   CBC: Recent Labs  Lab 02/13/24 2109 02/14/24 1243 02/14/24 1716 02/14/24 1741 02/15/24 0430 02/16/24 0513  WBC 4.4 11.2*  --  9.8 7.3 7.4  HGB 11.9* 12.2 10.2* 10.5* 9.6* 10.2*  HCT 37.3 37.5 30.0* 32.9* 30.8* 32.2*  MCV 94.7 92.8  --  93.5 97.2 95.3  PLT 91* 109*  --  116*  109* 119* 136*    Basic Metabolic Panel: Recent Labs  Lab  02/15/24 0430 02/15/24 1606 02/16/24 0513 02/16/24 1600 02/17/24 0419  NA 140  139 136 136 134* 135  K 4.6  4.5 4.3 4.1 4.2 3.9  CL 101  101 101 100 100 102  CO2 25  25 24 23  21* 22  GLUCOSE 86  86 97 114* 144* 143*  BUN 28*  28* 16 13 20 20   CREATININE 4.95*  4.87* 3.15* 2.25* 2.06* 1.93*  CALCIUM  9.4  9.4 9.4 9.9 9.9 10.7*  MG 2.4  --  2.5*  --  2.8*  PHOS 4.6 2.9 3.1 3.6 3.4  3.4   GFR: Estimated Creatinine Clearance: 32.9 mL/min (A) (by C-G formula based on SCr of 1.93 mg/dL (H)). Recent Labs  Lab 02/14/24 0949 02/14/24 1243 02/14/24 1741 02/15/24 0430 02/16/24 0513  WBC  --  11.2* 9.8 7.3 7.4  LATICACIDVEN 1.4 1.3  --   --   --     Liver Function Tests: Recent Labs  Lab 02/13/24 2109 02/14/24 0949 02/15/24 0430 02/15/24 1606 02/16/24 0513 02/16/24 1600 02/17/24 0419  AST 16  --   --   --   --   --   --   ALT 11  --   --   --   --   --   --   ALKPHOS 92  --   --   --   --   --   --   BILITOT 0.8  --   --   --   --   --   --   PROT 7.6  --   --   --   --   --   --   ALBUMIN 3.9   < > 3.0* 3.0* 3.1* 3.3* 3.4*   < > = values in this interval not displayed.   Recent Labs  Lab 02/13/24 2109  LIPASE 35   No results for input(s): AMMONIA in the last 168 hours.  ABG    Component Value Date/Time   PHART 7.444 02/14/2024 1716   PCO2ART 44.3 02/14/2024 1716   PO2ART 339 (H) 02/14/2024 1716   HCO3 30.3 (H) 02/14/2024 1716   TCO2 32 02/14/2024 1716   ACIDBASEDEF 5.2 (H) 01/13/2015 0300   O2SAT 100 02/14/2024 1716     Coagulation Profile: Recent Labs  Lab 02/14/24 1741  INR 1.3*    Cardiac Enzymes: No results for input(s): CKTOTAL, CKMB, CKMBINDEX, TROPONINI in the last 168 hours.  HbA1C: Hgb A1c MFr Bld  Date/Time Value Ref Range Status  02/14/2024 09:49 AM 4.2 (L) 4.8 - 5.6 % Final    Comment:    (NOTE) Diagnosis of Diabetes The following HbA1c ranges recommended by the American Diabetes Association (ADA) may be used as  an aid in the diagnosis of diabetes mellitus.  Hemoglobin             Suggested A1C NGSP%              Diagnosis  <5.7  Non Diabetic  5.7-6.4                Pre-Diabetic  >6.4                   Diabetic  <7.0                   Glycemic control for                       adults with diabetes.    11/11/2021 02:25 AM 4.1 (L) 4.8 - 5.6 % Final    Comment:    (NOTE) Pre diabetes:          5.7%-6.4%  Diabetes:              >6.4%  Glycemic control for   <7.0% adults with diabetes     CBG: Recent Labs  Lab 02/16/24 1513 02/16/24 2003 02/16/24 2332 02/17/24 0358 02/17/24 0742  GLUCAP 157* 179* 146* 124* 147*    Critical care time:    The patient is critically ill due to renal failure, encephalopathy.  Critical care was necessary to treat or prevent imminent or life-threatening deterioration.  Critical care was time spent personally by me on the following activities: development of treatment plan with patient and/or surrogate as well as nursing, discussions with consultants, evaluation of patient's response to treatment, examination of patient, obtaining history from patient or surrogate, ordering and performing treatments and interventions, ordering and review of laboratory studies, ordering and review of radiographic studies, pulse oximetry, re-evaluation of patient's condition and participation in multidisciplinary rounds.   Critical Care Time devoted to patient care services described in this note is 32 minutes. This time reflects time of care of this signee Jaylie Neaves S Emonee Winkowski . This critical care time does not reflect separately billable procedures or procedure time, teaching time or supervisory time of PA/NP/Med student/Med Resident etc but could involve care discussion time.       Aleck Hurdle [facility] 02/17/2024 9:02 AM  Pager: see AMION  If no response to pager , please call critical care on call (see AMION) until  7pm After 7:00 pm call Elink

## 2024-02-17 NOTE — Progress Notes (Signed)
 SLP Cancellation Note  Patient Details Name: Meenakshi Sazama MRN: [id number] DOB: Jun 30, 1981   Cancelled treatment:       Reason Eval/Treat Not Completed: Patient's level of consciousness. Back from MRi, still lethargic per RN. Will f/u tomorrow   Syra Sirmons, Hardin Leys 02/17/2024, 1:10 PM

## 2024-02-17 NOTE — Progress Notes (Signed)
 PT Cancellation Note  Patient Details Name: [name] [name] MRN: [id number] DOB: 12-Dec-1980   Cancelled Treatment:    Reason Eval/Treat Not Completed: (P) Patient not medically ready. RN requesting PT hold off today and re-attempt tomorrow as able.   Vernida Goodie, PT, DPT Acute Rehabilitation Services  Office: [phone]    Ellyn Hack 02/17/2024, 7:45 AM

## 2024-02-17 NOTE — Plan of Care (Signed)
  Problem: Education: Goal: Knowledge of disease or condition will improve Outcome: Not Progressing   Problem: Intracerebral Hemorrhage Tissue Perfusion: Goal: Complications of Intracerebral Hemorrhage will be minimized Outcome: Progressing   

## 2024-02-17 NOTE — Progress Notes (Signed)
 PHARMACY NOTE:  ANTIMICROBIAL RENAL DOSAGE ADJUSTMENT  Current antimicrobial regimen includes a mismatch between antimicrobial dosage and estimated renal function.  As per policy approved by the Pharmacy & Therapeutics and Medical Executive Committees, the antimicrobial dosage will be adjusted accordingly.  Current antimicrobial dosage:  piperacillin /tazobactam 3.375 q6h  Indication: colitis, aspiration PNA  Renal Function:  Estimated Creatinine Clearance: 32.9 mL/min (A) (by C-G formula based on SCr of 1.93 mg/dL (H)). [x]      On intermittent HD - stopped CRRT 6/15 []      On CRRT    Antimicrobial dosage has been changed to:  2.25g q8hr    Thank you for allowing pharmacy to be a part of this patient's care.  Dorene Gang, PharmD, BCPS, BCCP Clinical Pharmacist  Please check AMION for all [facility] phone numbers After 10:00 PM, call Main Pharmacy [phone]

## 2024-02-17 NOTE — Progress Notes (Signed)
 [facility] Progress Note   Subjective:   seen in ICU - CRRT running well - plan to stop today when she goes for MRI and leave off given more clinically stable.  I/os yest 2.1 / 3.7 net neg 3.2L for the admission.  BP 140s on A line.  EVD remains in place.  Per RN following commands fairly reliably but not really able to speak intelligibly.    Objective Vitals:   02/17/24 0857 02/17/24 1000 02/17/24 1100 02/17/24 1200  BP:  98/62 123/64 104/70  Pulse:    (!) 109  Resp:  (!) 27 (!) 26 (!) 30  Temp: 99.4 F (37.4 C)  99.3 F (37.4 C)   TempSrc: Oral  Oral   SpO2:    98%  Weight:      Height:       Physical Exam General: ill appearing, awake and moving in bed Heart: RRR Lungs: clear on [place] Abdomen:soft Extremities: minimal edema Dialysis Access: temp femoral HD catheter, RUE AV access +t/b  Additional Objective Labs: Basic Metabolic Panel: Recent Labs  Lab 02/16/24 0513 02/16/24 1600 02/17/24 0419  NA 136 134* 135  K 4.1 4.2 3.9  CL 100 100 102  CO2 23 21* 22  GLUCOSE 114* 144* 143*  BUN 13 20 20   CREATININE 2.25* 2.06* 1.93*  CALCIUM  9.9 9.9 10.7*  PHOS 3.1 3.6 3.4  3.4   Liver Function Tests: Recent Labs  Lab 02/13/24 2109 02/14/24 0949 02/16/24 0513 02/16/24 1600 02/17/24 0419  AST 16  --   --   --   --   ALT 11  --   --   --   --   ALKPHOS 92  --   --   --   --   BILITOT 0.8  --   --   --   --   PROT 7.6  --   --   --   --   ALBUMIN 3.9   < > 3.1* 3.3* 3.4*   < > = values in this interval not displayed.   Recent Labs  Lab 02/13/24 2109  LIPASE 35   CBC: Recent Labs  Lab 02/14/24 1243 02/14/24 1716 02/14/24 1741 02/15/24 0430 02/16/24 0513 02/17/24 0928  WBC 11.2*  --  9.8 7.3 7.4 9.0  HGB 12.2   < > 10.5* 9.6* 10.2* 11.4*  HCT 37.5   < > 32.9* 30.8* 32.2* 34.6*  MCV 92.8  --  93.5 97.2 95.3 94.0  PLT 109*  --  116*  109* 119* 136* 156   < > = values in this interval not displayed.   Blood Culture    Component Value  Date/Time   SDES BLOOD RIGHT HAND 02/14/2024 0949   SPECREQUEST  02/14/2024 0949    AEROBIC BOTTLE ONLY Blood Culture results may not be optimal due to an inadequate volume of blood received in culture bottles   CULT  02/14/2024 0949    NO GROWTH 3 DAYS Performed at [facility], [address], [place], [name] [address]    REPTSTATUS PENDING 02/14/2024 1308    Cardiac Enzymes: No results for input(s): CKTOTAL, CKMB, CKMBINDEX, TROPONINI in the last 168 hours. CBG: Recent Labs  Lab 02/16/24 2003 02/16/24 2332 02/17/24 0358 02/17/24 0742 02/17/24 1143  GLUCAP 179* 146* 124* 147* 183*   Iron  Studies: No results for input(s): IRON , TIBC, TRANSFERRIN, FERRITIN in the last 72 hours. @lablastinr3 @ Studies/Results: No results found.  Medications:  clevidipine  Stopped (02/16/24  4098)   feeding supplement (OSMOLITE 1.5 CAL) 50 mL/hr at 02/17/24 1200   piperacillin -tazobactam (ZOSYN )  IV Stopped (02/17/24 0541)   PrismaSol  BGK 2/3.5 600 mL/hr at 02/17/24 0112   PrismaSol  BGK 2/3.5 400 mL/hr at 02/17/24 0113   prismasol  BGK 4/2.5 1,000 mL/hr at 02/17/24 0609    amLODipine   10 mg Per Tube Daily   carvedilol   12.5 mg Per Tube BID WC   Chlorhexidine  Gluconate Cloth  6 each Topical Daily   cycloSPORINE   25 mg Per Tube Daily   docusate  100 mg Per Tube BID   doxercalciferol   4 mcg Intravenous Q M,W,F-HD   feeding supplement (PROSource TF20)  60 mL Per Tube BID   fentaNYL  (SUBLIMAZE ) injection  25-50 mcg Intravenous Once   insulin  aspart  0-9 Units Subcutaneous Q4H   levETIRAcetam   500 mg Intravenous BID   losartan   50 mg Per Tube Daily   mycophenolate   500 mg Per Tube BID   niMODipine   60 mg Per Tube Q4H   mouth rinse  15 mL Mouth Rinse 4 times per day   oxyCODONE   5 mg Per Tube Q6H   polyethylene glycol  17 g Per Tube Daily   predniSONE   15 mg Per Tube Q breakfast   senna-docusate  1 tablet Per Tube BID   sodium chloride  flush  3 mL Intravenous Q12H    sodium chloride  flush  3-10 mL Intravenous Q12H    Dialysis Orders: [facility] MWF 3:45 hrs 180NRe 400/500 57.2 kg 2.0 K/ 2.0 Ca AVF - No heparin  - Hectorol  4 mcg IV three times per week - Mircera 150 mcg IV q 2 weeks (last dose 02/11/2024 next dose due 02/25/2024)  Assessment/Plan:  Altered mental status secondary to ICH complicated by hydrocephalus:  Has drain in now. Management per neuro/CCM. Going for MRI today and limiting sedating meds.   Acute hypoxic respiratory failure- improved with UF.  Also on pip/tazo for aspiration.   ESRD - MWF HD typically, maintained on CRRT since 6/12.  Holding today and following for iHD needs.  Will tentatively plan for HD tomorrow.  Currently has temp fem HD catheter and RUE AVG.  Will leave temp cath today but should come out tomorrow if remains stable for iHD.    Hypertension/volume  - Chronic difficult to manage HTN. Bp goals per CCM currently. Cleviprex  now off, on enteral meds.   Anemia  - HGB stable 10s. Recent ESA. Follow labs.   Metabolic bone disease -  Chronic noncompliance with binders. Phos currently in 3s. Resume binder when needed. On VDRA.   Nutrition - Albumin OK. Enteral nutrition currently.   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 Continues on immunosuppressant TX for pancreas.  Pharm provided liquid dosing equivalents with NGT.  Followed by Gracelyn Laurence MD 02/17/2024, 1:12 PM  [facility] Pager: [phone]

## 2024-02-18 ENCOUNTER — Inpatient Hospital Stay (HOSPITAL_COMMUNITY)

## 2024-02-18 ENCOUNTER — Other Ambulatory Visit (HOSPITAL_COMMUNITY)

## 2024-02-18 DIAGNOSIS — I609 Nontraumatic subarachnoid hemorrhage, unspecified: Secondary | ICD-10-CM | POA: Diagnosis not present

## 2024-02-18 DIAGNOSIS — I615 Nontraumatic intracerebral hemorrhage, intraventricular: Secondary | ICD-10-CM | POA: Diagnosis not present

## 2024-02-18 DIAGNOSIS — R569 Unspecified convulsions: Secondary | ICD-10-CM

## 2024-02-18 DIAGNOSIS — I619 Nontraumatic intracerebral hemorrhage, unspecified: Secondary | ICD-10-CM | POA: Diagnosis not present

## 2024-02-18 DIAGNOSIS — G935 Compression of brain: Secondary | ICD-10-CM | POA: Diagnosis not present

## 2024-02-18 DIAGNOSIS — I161 Hypertensive emergency: Secondary | ICD-10-CM | POA: Diagnosis not present

## 2024-02-18 DIAGNOSIS — G936 Cerebral edema: Secondary | ICD-10-CM | POA: Diagnosis not present

## 2024-02-18 DIAGNOSIS — J9601 Acute respiratory failure with hypoxia: Secondary | ICD-10-CM | POA: Diagnosis not present

## 2024-02-18 DIAGNOSIS — N186 End stage renal disease: Secondary | ICD-10-CM | POA: Diagnosis not present

## 2024-02-18 LAB — RENAL FUNCTION PANEL
Albumin: 3.4 g/dL — ABNORMAL LOW (ref 3.5–5.0)
Albumin: 3.2 g/dL — ABNORMAL LOW (ref 3.5–5.0)
Anion gap: 21 — ABNORMAL HIGH (ref 5–15)
Anion gap: 25 — ABNORMAL HIGH (ref 5–15)
BUN: 57 mg/dL — ABNORMAL HIGH (ref 6–20)
BUN: 80 mg/dL — ABNORMAL HIGH (ref 6–20)
CO2: 15 mmol/L — ABNORMAL LOW (ref 22–32)
CO2: 9 mmol/L — ABNORMAL LOW (ref 22–32)
Calcium: 11.2 mg/dL — ABNORMAL HIGH (ref 8.9–10.3)
Calcium: 11.2 mg/dL — ABNORMAL HIGH (ref 8.9–10.3)
Chloride: 100 mmol/L (ref 98–111)
Chloride: 100 mmol/L (ref 98–111)
Creatinine, Ser: 4.11 mg/dL — ABNORMAL HIGH (ref 0.44–1.00)
Creatinine, Ser: 5.48 mg/dL — ABNORMAL HIGH (ref 0.44–1.00)
GFR, Estimated: 13 mL/min — ABNORMAL LOW (ref >60)
GFR, Estimated: 9 mL/min — ABNORMAL LOW (ref >60)
Glucose, Bld: 142 mg/dL — ABNORMAL HIGH (ref 70–99)
Glucose, Bld: 159 mg/dL — ABNORMAL HIGH (ref 70–99)
Phosphorus: 4.4 mg/dL (ref 2.5–4.6)
Phosphorus: 4.9 mg/dL — ABNORMAL HIGH (ref 2.5–4.6)
Potassium: 4.3 mmol/L (ref 3.5–5.1)
Potassium: 4.7 mmol/L (ref 3.5–5.1)
Sodium: 136 mmol/L (ref 135–145)
Sodium: 134 mmol/L — ABNORMAL LOW (ref 135–145)

## 2024-02-18 LAB — LACTIC ACID, PLASMA: Lactic Acid, Venous: 0.8 mmol/L (ref 0.5–1.9)

## 2024-02-18 LAB — GLUCOSE, CAPILLARY
Glucose-Capillary: 167 mg/dL — ABNORMAL HIGH (ref 70–99)
Glucose-Capillary: 166 mg/dL — ABNORMAL HIGH (ref 70–99)
Glucose-Capillary: 136 mg/dL — ABNORMAL HIGH (ref 70–99)
Glucose-Capillary: 147 mg/dL — ABNORMAL HIGH (ref 70–99)
Glucose-Capillary: 157 mg/dL — ABNORMAL HIGH (ref 70–99)
Glucose-Capillary: 179 mg/dL — ABNORMAL HIGH (ref 70–99)
Glucose-Capillary: 137 mg/dL — ABNORMAL HIGH (ref 70–99)

## 2024-02-18 LAB — MAGNESIUM: Magnesium: 3.2 mg/dL — ABNORMAL HIGH (ref 1.7–2.4)

## 2024-02-18 LAB — BETA-HYDROXYBUTYRIC ACID: Beta-Hydroxybutyric Acid: 0.13 mmol/L (ref 0.05–0.27)

## 2024-02-18 LAB — PHOSPHORUS: Phosphorus: 4.4 mg/dL (ref 2.5–4.6)

## 2024-02-18 LAB — TRIGLYCERIDES: Triglycerides: 193 mg/dL — ABNORMAL HIGH (ref <150)

## 2024-02-18 MED ORDER — BANATROL TF EN LIQD
60.0000 mL | Freq: Two times a day (BID) | ENTERAL | Status: DC
  Administered 2024-02-18 – 2024-02-22 (×9): 60 mL
  Filled 2024-02-18 (×9): qty 60

## 2024-02-18 MED ORDER — OXYCODONE HCL 5 MG PO TABS: 5.0000 mg | ORAL_TABLET | Freq: Four times a day (QID) | ORAL | Status: DC | PRN

## 2024-02-18 MED ORDER — OXYCODONE HCL 5 MG PO TABS
5.0000 mg | ORAL_TABLET | Freq: Four times a day (QID) | ORAL | Status: DC | PRN
  Administered 2024-02-18 – 2024-02-20 (×4): 5 mg
  Filled 2024-02-18 (×4): qty 1

## 2024-02-18 MED ORDER — SODIUM BICARBONATE 650 MG PO TABS
650.0000 mg | ORAL_TABLET | Freq: Three times a day (TID) | ORAL | Status: DC
  Administered 2024-02-18 – 2024-02-22 (×13): 650 mg
  Filled 2024-02-18 (×13): qty 1

## 2024-02-18 NOTE — Progress Notes (Addendum)
 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 Hypertensive in ED SBP 240s. Went for CT a/p and was c/f some colitis. Started on abx, given IVF. C/o pain and was very agitated, rcvd pain meds. Admitted 6/12. Over the course of the day she became less responsive. Remained hypertensive w SBPs >200. Incr O2 req to [facility]. Pt was to rcv HD 6/12 afternoon but when eval by HD NP, concerns for airway protection. Rcvd narcan  with only modest improvement. PCCM consulted in this setting    Pertinent  Medical History  Failed renal / pancreas txp ESRD  HTN DM1 gastroparesis   Significant Hospital Events: Including procedures, antibiotic start and stop dates in addition to other pertinent events   6/11 ED w n/v AMS. Abx IVF for colitis 6/12 admit. AMS decr LOC. Incr O2 req. PCCM consult and code stroke called seen w rapid response nephro NP then neuro  6/13 intubated with EVD, responding to commands  6/14 extubated  Interim History / Subjective:  Febrile ON, more tachy. Now improved. EVD unchanged at 10cm H2O, OP unchanged stable. Soft Bms.  Objective    Blood pressure 131/69, pulse (!) 105, temperature (!) 100.5 F (38.1 C), temperature source Axillary, resp. rate (!) 28, height 5' 7.5 (1.715 m), weight 55 kg, last menstrual period 02/13/2024, SpO2 100%.        Intake/Output Summary (Last 24 hours) at 02/18/2024 0836 Last data filed at 02/18/2024 0800 Gross per 24 hour  Intake 1559.97 ml  Output 253 ml  Net 1306.97 ml   Filed Weights   02/16/24 0500 02/17/24 0418 02/18/24 0402  Weight: 55 kg 54.8 kg 55 kg    Examination: Acutely ill appearing, laying flat on nasal  cannula Breathing non labored Tachycardic, systolic murmur On CRRT Opens eyes to loud voice and sternal rub, does not follow commands, right sided hemiparesis   Labs and imaging reviewed      Resolved problem list   Assessment and Plan   Large intracranial hemorrhage with brain compression -ICH score 4 Scattered SAH  Communicating hydrocephalus  Hypertensive emergency  Hx prior ischemic stroke  - CTH with large multi-compartment ICH with EVD placement and 5-15cc/hr output -NSGY following  - BP controlled off cleviprex  -continue Keppra  and Nimodipine   Acute respiratory failure w hypoxia Aspiration PNA  Pulmonary edema  - extubated 6/14. Continue zosyn  to cover aspiration and colitis  Colitis  -continue Zosyn  for colitis total 5 day course  ESRD on HD  Hyperkalemia  Hx failed renal txp & s/p pancreatic txp (2014) on chronic immunosuppression - dialysis per nephrology - continue immunosuppresants  DM1 (sp pancreas txp) - monitor CBGs, SSI -continue mycophenolate  and pred 1  Metabolic acidosis, AG --add per tube bicarb --check LA and beta hydroxy - BG ok and being fed  Hypercalcemia: --monitor   Best Practice (right click and Reselect all SmartList Selections daily)   Diet/type: NPO with tube feeds DVT prophylaxis SCD Pressure ulcer(s): pressure ulcer assessment deferred  GI prophylaxis: PPI Lines: N/A Foley:  N/A Code Status:  full code Last date of multidisciplinary goals of care discussion [friend updated 6/12. [place] ]  Labs   CBC: Recent Labs  Lab 02/14/24 1243 02/14/24 1716 02/14/24 1741 02/15/24 0430 02/16/24 0513 02/17/24 0928  WBC 11.2*  --  9.8 7.3 7.4 9.0  HGB 12.2 10.2* 10.5* 9.6* 10.2* 11.4*  HCT 37.5 30.0* 32.9* 30.8* 32.2* 34.6*  MCV 92.8  --  93.5 97.2 95.3 94.0  PLT 109*  --  116*  109* 119* 136* 156    Basic Metabolic Panel: Recent Labs  Lab 02/15/24 0430 02/15/24 1606 02/16/24 0513  02/16/24 1600 02/17/24 0419 02/17/24 1637 02/18/24 0404  NA 140  139   < > 136 134* 135 134* 136  K 4.6  4.5   < > 4.1 4.2 3.9 4.2 4.3  CL 101  101   < > 100 100 102 101 100  CO2 25  25   < > 23 21* 22 18* 15*  GLUCOSE 86  86   < > 114* 144* 143* 163* 142*  BUN 28*  28*   < > 13 20 20  35* 57*  CREATININE 4.95*  4.87*   < > 2.25* 2.06* 1.93* 2.71* 4.11*  CALCIUM  9.4  9.4   < > 9.9 9.9 10.7* 10.8* 11.2*  MG 2.4  --  2.5*  --  2.8*  --  3.2*  PHOS 4.6   < > 3.1 3.6 3.4  3.4 3.2 4.4  4.4   < > = values in this interval not displayed.   GFR: Estimated Creatinine Clearance: 15.5 mL/min (A) (by C-G formula based on SCr of 4.11 mg/dL (H)). Recent Labs  Lab 02/14/24 0949 02/14/24 1243 02/14/24 1741 02/15/24 0430 02/16/24 0513 02/17/24 0928  WBC  --  11.2* 9.8 7.3 7.4 9.0  LATICACIDVEN 1.4 1.3  --   --   --   --     Liver Function Tests: Recent Labs  Lab 02/13/24 2109 02/14/24 0949 02/16/24 0513 02/16/24 1600 02/17/24 0419 02/17/24 1637 02/18/24 0404  AST 16  --   --   --   --   --   --   ALT 11  --   --   --   --   --   --   ALKPHOS 92  --   --   --   --   --   --   BILITOT 0.8  --   --   --   --   --   --   PROT 7.6  --   --   --   --   --   --   ALBUMIN 3.9   < > 3.1* 3.3* 3.4* 3.4* 3.4*   < > = values in this interval not displayed.   Recent Labs  Lab 02/13/24 2109  LIPASE 35   No results for input(s): AMMONIA in the last 168 hours.  ABG    Component Value Date/Time   PHART 7.444 02/14/2024 1716   PCO2ART 44.3 02/14/2024 1716   PO2ART 339 (H) 02/14/2024 1716   HCO3 30.3 (H) 02/14/2024 1716   TCO2 32 02/14/2024 1716   ACIDBASEDEF 5.2 (H) 01/13/2015 0300   O2SAT 100 02/14/2024 1716     Coagulation Profile: Recent Labs  Lab 02/14/24 1741  INR 1.3*    Cardiac Enzymes: No results for input(s): CKTOTAL, CKMB, CKMBINDEX, TROPONINI in the last 168 hours.  HbA1C: Hgb A1c MFr Bld  Date/Time Value Ref Range Status  02/14/2024 09:49  AM 4.2 (L) 4.8 - 5.6 % Final    Comment:    (  NOTE) Diagnosis of Diabetes The following HbA1c ranges recommended by the American Diabetes Association (ADA) may be used as an aid in the diagnosis of diabetes mellitus.  Hemoglobin             Suggested A1C NGSP%              Diagnosis  <5.7                   Non Diabetic  5.7-6.4                Pre-Diabetic  >6.4                   Diabetic  <7.0                   Glycemic control for                       adults with diabetes.    11/11/2021 02:25 AM 4.1 (L) 4.8 - 5.6 % Final    Comment:    (NOTE) Pre diabetes:          5.7%-6.4%  Diabetes:              >6.4%  Glycemic control for   <7.0% adults with diabetes     CBG: Recent Labs  Lab 02/17/24 0742 02/17/24 1143 02/17/24 2337 02/18/24 0338 02/18/24 0725  GLUCAP 147* 183* 131* 136* 147*    Critical care time:    CRITICAL CARE Performed by: Archer Kobs Zavannah Deblois   Total critical care time: 33 minutes  Critical care time was exclusive of separately billable procedures and treating other patients.  Critical care was necessary to treat or prevent imminent or life-threatening deterioration.  Critical care was time spent personally by me on the following activities: development of treatment plan with patient and/or surrogate as well as nursing, discussions with consultants, evaluation of patient's response to treatment, examination of patient, obtaining history from patient or surrogate, ordering and performing treatments and interventions, ordering and review of laboratory studies, ordering and review of radiographic studies, pulse oximetry and re-evaluation of patient's condition.      Archer Kobs Chayden Garrelts [facility] 02/18/2024 8:36 AM  See AMION  If no response to pager , please call critical care on call (see AMION) until 7pm After 7:00 pm call Elink

## 2024-02-18 NOTE — Progress Notes (Signed)
 Subjective: The patient has been extubated.  She is in no apparent distress.  Objective: Vital signs in last 24 hours: Temp:  [98.7 F (37.1 C)-101.7 F (38.7 C)] 100.5 F (38.1 C) (06/16 0800) Pulse Rate:  [95-121] 106 (06/16 1000) Resp:  [20-33] 28 (06/16 1000) BP: (104-155)/(64-81) 133/68 (06/16 1000) SpO2:  [92 %-100 %] 99 % (06/16 1000) Arterial Line BP: (94-171)/(51-89) 156/60 (06/16 1000) Weight:  [55 kg] 55 kg (06/16 0402) Estimated body mass index is 18.71 kg/m as calculated from the following:   Height as of this encounter: 5' 7.5 (1.715 m).   Weight as of this encounter: 55 kg.   Intake/Output from previous day: 06/15 0701 - 06/16 0700 In: 1660 [NG/GT:1500; IV Piggyback:160] Out: 366 [Drains:139] Intake/Output this shift: Total I/O In: 150 [NG/GT:150] Out: 25 [Drains:25]  Physical exam the patient is somnolent but easily arousable.  She can tell me her name.  She localizes and somewhat follows commands.  Her ventriculostomy is sluggish but raining adequately.  Her brain MRI done yesterday demonstrates MR findings as her previous CT scans.  Lab Results: Recent Labs    02/16/24 0513 02/17/24 0928  WBC 7.4 9.0  HGB 10.2* 11.4*  HCT 32.2* 34.6*  PLT 136* 156   BMET Recent Labs    02/17/24 1637 02/18/24 0404  NA 134* 136  K 4.2 4.3  CL 101 100  CO2 18* 15*  GLUCOSE 163* 142*  BUN 35* 57*  CREATININE 2.71* 4.11*  CALCIUM  10.8* 11.2*    Studies/Results: MR BRAIN WO CONTRAST Result Date: 02/17/2024 CLINICAL DATA:  Stroke, hemorrhagic. EXAM: MRI HEAD WITHOUT CONTRAST TECHNIQUE: Multiplanar, multiecho pulse sequences of the brain and surrounding structures were obtained without intravenous contrast. COMPARISON:  Head CT 02/14/2024 and MRI 12/24/2021 FINDINGS: The study is moderately motion degraded. Brain: There is a small acute infarct in the genu and anterior body of the corpus callosum. No definite acute infarct is identified elsewhere allowing for  multifocal susceptibility artifact on diffusion-weighted imaging from blood products. A right frontal approach ventricular drain remains in place, terminating in the body of the right lateral ventricle. The ventricles are mildly to moderately dilated, mildly improved from the most recent prior CT. Layering hemorrhage in the occipital horns of the lateral ventricles has decreased. Moderately large volume perimesencephalic subarachnoid hemorrhage centered at the level of the prepontine cistern has decreased. Scattered smaller volume supratentorial subarachnoid hemorrhage is grossly similar to the prior CT and is greatest in the left sylvian fissure. Trace subdural hemorrhage is present over the right cerebral convexity and tentorium as well as in the posterior fossa bilaterally. T2 hyperintensities in the cerebral white matter and pons have progressed from the prior MRI and are nonspecific but compatible with age advanced chronic small vessel ischemic disease. There are chronic lacunar infarcts in the bilateral basal ganglia, right greater than left thalami, and pons which have increased in number from the prior MRI. A chronic left cerebellar infarct is unchanged. There is no midline shift. Vascular: Major intracranial vascular flow voids are preserved. Skull and upper cervical spine: Unremarkable bone marrow signal. Sinuses/Orbits: Bilateral cataract extraction. Mild mucosal thickening in the right sphenoid sinus. Moderate bilateral mastoid effusions. Other: None. IMPRESSION: 1. 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 2. Mildly improved hydrocephalus with a ventricular drain in place. 3. Decreased perimesencephalic subarachnoid hemorrhage and intraventricular hemorrhage. 4. Trace supratentorial and infratentorial subdural hemorrhage. 5. Age advanced chronic small vessel ischemic disease with chronic lacunar infarcts as above, progressed from  2023. These results will be called to the ordering clinician or  representative by the Radiologist Assistant, and communication documented in the PACS or [employer]. Electronically Signed   By: Aundra Lee M.D.   On: 02/17/2024 13:49    Assessment/Plan: Subarachnoid hemorrhage, intraventricular hemorrhage, hydrocephalus: The patient is doing better clinically with the ventriculostomy.  Her CTA was negative.  I we will discuss an arteriogram with Dr. Nat Badger.  LOS: 4 days     [name] [name] 02/18/2024, 11:05 AM     Patient ID: [name] [name], female   DOB: Sep 15, 1980, 43 y.o.   MRN: [id number]

## 2024-02-18 NOTE — Progress Notes (Signed)
 STROKE TEAM PROGRESS NOTE    SIGNIFICANT HOSPITAL EVENTS 6/11- ED for nausea, vomiting after dialysis 6/12- intubated, EVD placed   INTERIM HISTORY/SUBJECTIVE Patient is extubated but remains quite sleepy is hard to arouse and very lethargic.  Ventriculostomy is draining well.  Dr. Nat Badger plans to do angiogram tomorrow.  Transcranial Doppler studies are pending  She is receiving CRRT.  MRI scan done yesterday shows tiny diffusion hyperintensity in the anterior corpus callosum which could be a lacunar infarct though blood products from subarachnoid hemorrhage could also contribute.  There is trace supratentorial infratentorial subdural hemorrhage and exam changes of age advanced small vessel disease and old lacunar infarcts.   OBJECTIVE  CBC    Component Value Date/Time   WBC 9.0 02/17/2024 0928   RBC 3.68 (L) 02/17/2024 0928   HGB 11.4 (L) 02/17/2024 0928   HCT 34.6 (L) 02/17/2024 0928   PLT 156 02/17/2024 0928   MCV 94.0 02/17/2024 0928   MCH 31.0 02/17/2024 0928   MCHC 32.9 02/17/2024 0928   RDW 13.6 02/17/2024 0928   LYMPHSABS 1.0 11/05/2023 0343   MONOABS 0.2 11/05/2023 0343   EOSABS 0.1 11/05/2023 0343   BASOSABS 0.0 11/05/2023 0343    BMET    Component Value Date/Time   NA 136 02/18/2024 0404   K 4.3 02/18/2024 0404   CL 100 02/18/2024 0404   CO2 15 (L) 02/18/2024 0404   GLUCOSE 142 (H) 02/18/2024 0404   BUN 57 (H) 02/18/2024 0404   CREATININE 4.11 (H) 02/18/2024 0404   CALCIUM  11.2 (H) 02/18/2024 0404   GFRNONAA 13 (L) 02/18/2024 0404    IMAGING past 24 hours No results found.   Vitals:   02/18/24 1100 02/18/24 1200 02/18/24 1300 02/18/24 1400  BP: (!) 145/70 (!) 150/68 (!) 119/56 132/66  Pulse: (!) 106 100  (!) 116  Resp: (!) 29 (!) 28 (!) 30 (!) 31  Temp:  (!) 102.2 F (39 C)    TempSrc:  Axillary    SpO2: 98% 100%  100%  Weight:      Height:         PHYSICAL EXAM General: Lethargic sedated but will open eyes to voice CV: Regular rate and  rhythm on monitor Respiratory: Mechanically ventilated  GI: Abdomen soft and nontender   NEURO:  Mental status: intubated on sedation, eyes closed, but will open eyes and follow simple commands with sedation turned off. Disconjugate pupils gag and cough present.  Tongue protrusion not cooperative.  Able to lift against gravity RUE and RLE. Purposeful movement of the RUE  LUE drift and unable to lift LLE of the bed.  Sensation, coordination and gait not tested.   ASSESSMENT/PLAN  [name] [name] is a 43 y.o. female with history of hypertension, ESRD on HD, s/p renal and pancreatic transplant, diabetes mellitus type 1 with gastroparesis presents with nausea and vomiting since after dialysis.  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 Intraventricular blood products layering in the occipital horns.  CTA head & neck - No large vessel occlusion. No high-grade stenosis, aneurysm, or dissection of the arteries in the head and neck. Mild stenosis of the V4 segments of both vertebral arteries. Mild stenosis of the right supraclinoid ICA.  6/12 repeat CT Head- Interval placement of a right frontal approach ventriculostomy with tip in the body of the right  lateral ventricle. Persistent but relatively stable hydrocephalus at this time. Moderate volume acute subarachnoid hemorrhage with associated IVH, similar to prior. No evidence for significant interval bleeding since prior. MRI completed, pending radiology report 2D Echo EF 60-65%, LA dilated  Recommend cerebral angiogram once stable to rule out prepontine vascular anomaly LDL 63 HgbA1c 4.2 VTE prophylaxis - SCDs No antithrombotic prior to admission, now on No antithrombotic  Therapy recommendations:  Pending Disposition:  Pending   Cerebral edema with brain  compression Hydrocephalous s/p EVD CT head Increased caliber of the ventricles concerning for developing communicating hydrocephalus.  NSGY consulted EVD placement 6/12 On Keppra   ? Vasospasm  CTA head and neck Mild irregularity of the basilar artery without significant narrowing. Finding concerning for mild vasospasm. TCD monitoring pending On nimodipine    Respiratory failure  Aspiration pneumonia PCCM primary Intubated 6/12 Sedated with propofol , fentanyl  On Zosyn   Hypertensive Emergency  Home meds:  Norvasc , coreg , clonidine  patch, hydralazine   Unstable Cleviprex , labetalol  PRN BP goal < 160 Long term BP goal normotensive  Diabetes Type 1 Hx of pancreas transplant  HgbA1c 4.2, goal < 7.0 CBGs, SSI  ESRD on HD Hx of renal transplant  HD patient with fistula Nephrology on board Now on CRRT   Dysphagia NG tube in place Speech therapy consult Nutrition consult for tube feeds  Other stroke risk factors   Other medical issues DM gastroparesis  Colitis  Thrombocytopenia - platelet 109  Hospital day # 4    02/18/2024 2:04 PM    I have personally obtained history,examined this patient, reviewed notes, independently viewed imaging studies, participated in medical decision making and plan of care.ROS completed by me personally and pertinent positives fully documented  I have made any additions or clarifications directly to the above note. Agree with note above.  Patient presented with vomiting and nausea following CRRT brain imaging shows perimesencephalic large subarachnoid blood with hydrocephalus and despite ventriculostomy her mental status remains quite lethargic which may be multifactorial given renal failure when this metabolic derangements.  Recommend discontinue Keppra  as she clearly did not have a seizure and it could be contributing to mental status.  Check EEG for seizure activity.  Check transcranial Dopplers for vasospasm.  Discussed with Dr. Nat Badger to  do elective diagnostic cerebral angiogram to look for venous AVM versus aneurysm or vasospasm.Aaron Aas  No family available at the bedside.  Continue strict blood pressure control systolic goal below 160.  Ventriculostomy drainage as per neurosurgery.  Discussed with Dr. Marygrace Snellen critical care medicine and Dr. Ellery Guthrie and [facility] neurosurgery.  This patient is critically ill and at significant risk of neurological worsening, death and care requires constant monitoring of vital signs, hemodynamics,respiratory and cardiac monitoring, extensive review of multiple databases, frequent neurological assessment, discussion with family, other specialists and medical decision making of high complexity.I have made any additions or clarifications directly to the above note.This critical care time does not reflect procedure time, or teaching time or supervisory time of PA/NP/Med Resident etc but could involve care discussion time.  I spent 30 minutes of neurocritical care time  in the care of  this patient.      Ardella Beaver, MD Medical Director [facility] Pager: [phone] 02/18/2024 2:04 PM     To contact Stroke Continuity provider, please refer to [email or web address]. After hours, contact General Neurology

## 2024-02-18 NOTE — Progress Notes (Signed)
 SLP Cancellation Note  Patient Details Name: [name] [name] MRN: [id number] DOB: 1980/10/22   Cancelled treatment:       Reason Eval/Treat Not Completed: Patient's level of consciousness. Discussed with RN, who states pt is not yet demonstrating readiness to participate in therapy evals. SLP will continue following.     Amil Kale, M.A., CCC-SLP Speech Language Pathology, Acute Rehabilitation Services  Secure Chat preferred [phone]  02/18/2024, 9:54 AM

## 2024-02-18 NOTE — Progress Notes (Signed)
 eLink Physician-Brief Progress Note Patient Name: [name] [name] DOB: Dec 02, 1980 MRN: [id number]   Date of Service  02/18/2024  HPI/Events of Note  Large amounts of stooling.  Last WBC 9.  Currently on Zosyn .  Temperature of 1-1.7.  Last set of cultures with no growth  eICU Interventions  Add rectal tube  Antipyretics for fever in place     Intervention Category Minor Interventions: Routine modifications to care plan (e.g. PRN medications for pain, fever)  Senora Lacson 02/18/2024, 12:12 AM

## 2024-02-18 NOTE — Progress Notes (Signed)
 Pt receives out-pt HD at [facility] on MWF 6:15 am chair time. Will assist as needed.   Lauraine Polite Renal Navigator [phone]

## 2024-02-18 NOTE — Progress Notes (Signed)
 PT Cancellation Note  Patient Details Name: Sarie Stall MRN: [id number] DOB: 08-26-1981   Cancelled Treatment:    Reason Eval/Treat Not Completed: Medical issues which prohibited therapy - nontunneled HD cath in R groin, MD request hold today. PT to check back tomorrow pending removal of groin cath.   Sharonna Vinje S, PT DPT Acute Rehabilitation Services Secure Chat Preferred  Office [phone]    Cherry Wittwer E Stroup 02/18/2024, 9:16 AM

## 2024-02-18 NOTE — Progress Notes (Signed)
 Nutrition Follow-up  DOCUMENTATION CODES:   Non-severe (moderate) malnutrition in context of chronic illness  INTERVENTION:    Continue tube feeding via Cortrak tube: Osmolite 1.5 @ 50 ml/hr (1200 ml per day)  D/C Prosource TF20 60 ml BID  Provides 1800 kcal, 75 gm protein, 912 ml free water  daily    NUTRITION DIAGNOSIS:   Moderate Malnutrition related to chronic illness (ESRD on HD/DM) as evidenced by mild fat depletion, mild muscle depletion. Ongoing.   GOAL:   Patient will meet greater than or equal to 90% of their needs Met with TF at goal  MONITOR:   Vent status, TF tolerance  REASON FOR ASSESSMENT:   Consult, Ventilator Enteral/tube feeding initiation and management  ASSESSMENT:   Pt with PMH of HTN, ESRD on HD, s/p renal and pancreatic transplant 07/16/13, DM type 1 with gastroparesis admitted with N/V after HD with ICH brainstem hemorrhage with IVH, likely hypertensive.   Pt discussed during ICU rounds and with RN and MD.  Extubated, EVD remains in place. NPO due to lethargy. SLP following.  CRRT off, renal monitoring for resuming iHD   6/11 - ED for abx for colitis 6/12 - admit for ICH; intubated with EVD; started CRRT 6/13 - s/p cortrak placement; tip gastric 6/14 - extubated  6/15 - CRRT stopped  Medications reviewed and include: banatrol TF BID, SSI every 4 hours, keppra , cellcept , prednisone     Labs reviewed:  K 4.2 -> 4.3 BUN/Cr 57/4.11 Phos 4.4 A1C 4.2  ICP 139 ml CRRT 227 ml x 24 hours   Current weight 55 kg EDW 57.2 kg Admission weight 59.3 kg   Diet Order:   Diet Order             Diet NPO time specified  Diet effective now                   EDUCATION NEEDS:   No education needs have been identified at this time  Skin:  Skin Assessment: Reviewed RN Assessment  Last BM:  unmeasured, FMS placed 6/16  Height:   Ht Readings from Last 1 Encounters:  02/14/24 5' 7.5 (1.715 m)    Weight:   Wt Readings from  Last 1 Encounters:  02/18/24 55 kg    BMI:  Body mass index is 18.71 kg/m.  Estimated Nutritional Needs:   Kcal:  1800-2000  Protein:  75-90 grams  Fluid:  1L + UOP  Korde Jeppsen P., RD, LDN, CNSC See AMiON for contact information

## 2024-02-18 NOTE — Progress Notes (Signed)
 [facility] Progress Note   Subjective:   off CRRT, for IHD today.    Objective Vitals:   02/18/24 0900 02/18/24 1000 02/18/24 1100 02/18/24 1200  BP: 130/68 133/68 (!) 145/70   Pulse: (!) 107 (!) 106 (!) 106   Resp: (!) 25 (!) 28 (!) 29   Temp:    (!) 102.2 F (39 C)  TempSrc:    Axillary  SpO2: 100% 99% 98%   Weight:      Height:       Physical Exam General: ill appearing, eyes closed, moving Heart: RRR Lungs: clear on [place] Abdomen:soft Extremities: minimal edema Dialysis Access: temp femoral HD catheter, RUE AV access +t/b  Additional Objective Labs: Basic Metabolic Panel: Recent Labs  Lab 02/17/24 0419 02/17/24 1637 02/18/24 0404  NA 135 134* 136  K 3.9 4.2 4.3  CL 102 101 100  CO2 22 18* 15*  GLUCOSE 143* 163* 142*  BUN 20 35* 57*  CREATININE 1.93* 2.71* 4.11*  CALCIUM  10.7* 10.8* 11.2*  PHOS 3.4  3.4 3.2 4.4  4.4   Liver Function Tests: Recent Labs  Lab 02/13/24 2109 02/14/24 0949 02/17/24 0419 02/17/24 1637 02/18/24 0404  AST 16  --   --   --   --   ALT 11  --   --   --   --   ALKPHOS 92  --   --   --   --   BILITOT 0.8  --   --   --   --   PROT 7.6  --   --   --   --   ALBUMIN 3.9   < > 3.4* 3.4* 3.4*   < > = values in this interval not displayed.   Recent Labs  Lab 02/13/24 2109  LIPASE 35   CBC: Recent Labs  Lab 02/14/24 1243 02/14/24 1716 02/14/24 1741 02/15/24 0430 02/16/24 0513 02/17/24 0928  WBC 11.2*  --  9.8 7.3 7.4 9.0  HGB 12.2   < > 10.5* 9.6* 10.2* 11.4*  HCT 37.5   < > 32.9* 30.8* 32.2* 34.6*  MCV 92.8  --  93.5 97.2 95.3 94.0  PLT 109*  --  116*  109* 119* 136* 156   < > = values in this interval not displayed.   Blood Culture    Component Value Date/Time   SDES BLOOD RIGHT HAND 02/14/2024 0949   SPECREQUEST  02/14/2024 0949    AEROBIC BOTTLE ONLY Blood Culture results may not be optimal due to an inadequate volume of blood received in culture bottles   CULT  02/14/2024 0949    NO GROWTH 4  DAYS Performed at [facility], [address], [place], [name] [address]    REPTSTATUS PENDING 02/14/2024 4132    Cardiac Enzymes: No results for input(s): CKTOTAL, CKMB, CKMBINDEX, TROPONINI in the last 168 hours. CBG: Recent Labs  Lab 02/17/24 1941 02/17/24 2337 02/18/24 0338 02/18/24 0725 02/18/24 1120  GLUCAP 166* 131* 136* 147* 157*   Iron  Studies: No results for input(s): IRON , TIBC, TRANSFERRIN, FERRITIN in the last 72 hours. @lablastinr3 @ Studies/Results: MR BRAIN WO CONTRAST Result Date: 02/17/2024 CLINICAL DATA:  Stroke, hemorrhagic. EXAM: MRI HEAD WITHOUT CONTRAST TECHNIQUE: Multiplanar, multiecho pulse sequences of the brain and surrounding structures were obtained without intravenous contrast. COMPARISON:  Head CT 02/14/2024 and MRI 12/24/2021 FINDINGS: The study is moderately motion degraded. Brain: There is a small acute infarct in the genu and anterior body of the corpus  callosum. No definite acute infarct is identified elsewhere allowing for multifocal susceptibility artifact on diffusion-weighted imaging from blood products. A right frontal approach ventricular drain remains in place, terminating in the body of the right lateral ventricle. The ventricles are mildly to moderately dilated, mildly improved from the most recent prior CT. Layering hemorrhage in the occipital horns of the lateral ventricles has decreased. Moderately large volume perimesencephalic subarachnoid hemorrhage centered at the level of the prepontine cistern has decreased. Scattered smaller volume supratentorial subarachnoid hemorrhage is grossly similar to the prior CT and is greatest in the left sylvian fissure. Trace subdural hemorrhage is present over the right cerebral convexity and tentorium as well as in the posterior fossa bilaterally. T2 hyperintensities in the cerebral white matter and pons have progressed from the prior MRI and are nonspecific but compatible with age  advanced chronic small vessel ischemic disease. There are chronic lacunar infarcts in the bilateral basal ganglia, right greater than left thalami, and pons which have increased in number from the prior MRI. A chronic left cerebellar infarct is unchanged. There is no midline shift. Vascular: Major intracranial vascular flow voids are preserved. Skull and upper cervical spine: Unremarkable bone marrow signal. Sinuses/Orbits: Bilateral cataract extraction. Mild mucosal thickening in the right sphenoid sinus. Moderate bilateral mastoid effusions. Other: None. IMPRESSION: 1. 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 2. Mildly improved hydrocephalus with a ventricular drain in place. 3. Decreased perimesencephalic subarachnoid hemorrhage and intraventricular hemorrhage. 4. Trace supratentorial and infratentorial subdural hemorrhage. 5. Age advanced chronic small vessel ischemic disease with chronic lacunar infarcts as above, progressed from 2023. These results will be called to the ordering clinician or representative by the Radiologist Assistant, and communication documented in the PACS or [employer]. Electronically Signed   By: Aundra Lee M.D.   On: 02/17/2024 13:49    Medications:  feeding supplement (OSMOLITE 1.5 CAL) 50 mL/hr at 02/18/24 1000   piperacillin -tazobactam (ZOSYN )  IV Stopped (02/18/24 0600)    amLODipine   10 mg Per Tube Daily   carvedilol   12.5 mg Per Tube BID WC   Chlorhexidine  Gluconate Cloth  6 each Topical Daily   cycloSPORINE   25 mg Per Tube Daily   doxercalciferol   4 mcg Intravenous Q M,W,F-HD   fentaNYL  (SUBLIMAZE ) injection  25-50 mcg Intravenous Once   fiber supplement (BANATROL TF)  60 mL Per Tube BID   insulin  aspart  0-9 Units Subcutaneous Q4H   levETIRAcetam   500 mg Intravenous BID   losartan   50 mg Per Tube Daily   mycophenolate   500 mg Per Tube BID   niMODipine   60 mg Per Tube Q4H   mouth rinse  15 mL Mouth Rinse 4 times per day   predniSONE   15 mg  Per Tube Q breakfast   sodium bicarbonate   650 mg Per Tube TID   sodium chloride  flush  3 mL Intravenous Q12H   sodium chloride  flush  3-10 mL Intravenous Q12H    Dialysis Orders: [facility] MWF 3:45 hrs 180NRe 400/500 57.2 kg 2.0 K/ 2.0 Ca AVF - No heparin  - Hectorol  4 mcg IV three times per week - Mircera 150 mcg IV q 2 weeks (last dose 02/11/2024 next dose due 02/25/2024)  Assessment/Plan:  Altered mental status secondary to ICH complicated by hydrocephalus:  Has drain in now. Management per neuro/CCM.  MRI showed improvement of hydrocephalus and limiting sedating meds.   Acute hypoxic respiratory failure- improved with UF.  Also on pip/tazo for aspiration.   ESRD - MWF  HD typically, maintained on CRRT since 6/12.  HD today, can d/c tempcath if AVG runs well  Hypertension/volume  - Chronic difficult to manage HTN. Bp goals per CCM currently. Cleviprex  now off, on enteral meds.   Anemia  - HGB stable 10s. Recent ESA. Follow labs.   Metabolic bone disease -  Chronic noncompliance with binders. Phos currently in 3s. Resume binder when needed. On VDRA.   Nutrition - Albumin OK. Enteral nutrition currently.   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 Continues on immunosuppressant [place] for pancreas.  Pharm provided liquid dosing equivalents with NGT.  Followed by Vallie Gay MD 02/18/2024, 1:25 PM  [facility]

## 2024-02-18 NOTE — Progress Notes (Signed)
 Transcranial Doppler  Date POD PCO2 HCT BP  MCA ACA PCA OPHT SIPH VERT Basilar  6/16 CK    130/59 Right  Left   *  13   *    -50 *    25 25    21  *    * -28    -18 -22           Right  Left                                            Right  Left                                             Right  Left                                             Right  Left                                            Right  Left                                            Right  Left                                       *Unable to insonate [place] Ratio:  R: N/A L:0.48  MCA = Middle Cerebral Artery      OPHT = Opthalmic Artery     BASILAR = Basilar Artery   ACA = Anterior Cerebral Artery     SIPH = Carotid Siphon PCA = Posterior Cerebral Artery   VERT = Verterbral Artery                   Normal MCA = 62+\-12 ACA = 50+\-12 PCA = 42+\-23

## 2024-02-18 NOTE — Procedures (Signed)
 Patient Name: [name] [name]  MRN: [id number]  Epilepsy Attending: Arleene Lack  Referring Physician/Provider: Audrene Lease, NP  Date: 02/18/2024 Duration: 24.30 mins  Patient history: 43yo F with perimesencephalic SAH with IVH. EEG to evaluate for seizure.  Level of alertness:  comatose  AEDs during EEG study: LEV, Prednisone   Technical aspects: This EEG study was done with scalp electrodes positioned according to the 10-20 International system of electrode placement. Electrical activity was reviewed with band pass filter of 1-70Hz , sensitivity of 7 uV/mm, display speed of 61mm/sec with a 60Hz  notched filter applied as appropriate. EEG data were recorded continuously and digitally stored.  Video monitoring was available and reviewed as appropriate.  Description: EEG showed continuous generalized polymorphic sharply contoured 3 to 6 Hz theta-delta slowing. Generalized periodic discharges with triphasic morphology were also noted at 1-2 Hz, with waxing and waning morphology. Hyperventilation and photic stimulation were not performed.     ABNORMALITY - Periodic discharges with triphasic morphology, generalized ( GPDs) - Continuous slow, generalized  IMPRESSION: This study showed generalized periodic discharges with triphasic morphology which can be due to toxic-metabolic etiology. However, given the frequency of discharges and the waxing and waning morphology, this pattern is on the ictal-interictal continuum. Consider long term eeg if concern for ictal activity persists.   Additionally there was severe diffuse encephalopathy. No definite seizures  were seen throughout the recording.  Benford Asch O Glorya Bartley

## 2024-02-18 NOTE — Progress Notes (Signed)
 EEG complete - results pending

## 2024-02-18 NOTE — TOC CM/SW Note (Signed)
 Transition of Care [facility]) - Inpatient Brief Assessment   Patient Details  Name: [name] [name] MRN: [id number] Date of Birth: [birth year]  Transition of Care [facility]) CM/SW Contact:    Jannice Mends, LCSW Phone Number: 02/18/2024, 6:00 PM   Clinical Narrative: Patient continues neuro workup with EVD and Cortrak in place. Patient goes to Dialysis outpatient. TOC to continue to follow.    Transition of Care Asessment: Insurance and Status: Insurance coverage has been reviewed Patient has primary care physician: Yes Home environment has been reviewed: From home Prior level of function:: Independent Prior/Current Home Services: No current home services Social Drivers of Health Review: SDOH reviewed no interventions necessary Readmission risk has been reviewed: Yes Transition of care needs: transition of care needs identified, TOC will continue to follow

## 2024-02-19 ENCOUNTER — Inpatient Hospital Stay (HOSPITAL_COMMUNITY)

## 2024-02-19 DIAGNOSIS — I608 Other nontraumatic subarachnoid hemorrhage: Secondary | ICD-10-CM

## 2024-02-19 DIAGNOSIS — I161 Hypertensive emergency: Secondary | ICD-10-CM

## 2024-02-19 DIAGNOSIS — J690 Pneumonitis due to inhalation of food and vomit: Secondary | ICD-10-CM

## 2024-02-19 DIAGNOSIS — I609 Nontraumatic subarachnoid hemorrhage, unspecified: Secondary | ICD-10-CM | POA: Diagnosis not present

## 2024-02-19 DIAGNOSIS — G935 Compression of brain: Secondary | ICD-10-CM | POA: Diagnosis not present

## 2024-02-19 DIAGNOSIS — K529 Noninfective gastroenteritis and colitis, unspecified: Secondary | ICD-10-CM | POA: Diagnosis not present

## 2024-02-19 DIAGNOSIS — I615 Nontraumatic intracerebral hemorrhage, intraventricular: Secondary | ICD-10-CM | POA: Diagnosis not present

## 2024-02-19 DIAGNOSIS — N186 End stage renal disease: Secondary | ICD-10-CM | POA: Diagnosis not present

## 2024-02-19 LAB — RENAL FUNCTION PANEL
Albumin: 3.3 g/dL — ABNORMAL LOW (ref 3.5–5.0)
Albumin: 3.2 g/dL — ABNORMAL LOW (ref 3.5–5.0)
Anion gap: 21 — ABNORMAL HIGH (ref 5–15)
Anion gap: 20 — ABNORMAL HIGH (ref 5–15)
BUN: 36 mg/dL — ABNORMAL HIGH (ref 6–20)
BUN: 51 mg/dL — ABNORMAL HIGH (ref 6–20)
CO2: 24 mmol/L (ref 22–32)
CO2: 22 mmol/L (ref 22–32)
Calcium: 9.8 mg/dL (ref 8.9–10.3)
Calcium: 9.8 mg/dL (ref 8.9–10.3)
Chloride: 92 mmol/L — ABNORMAL LOW (ref 98–111)
Chloride: 92 mmol/L — ABNORMAL LOW (ref 98–111)
Creatinine, Ser: 2.94 mg/dL — ABNORMAL HIGH (ref 0.44–1.00)
Creatinine, Ser: 3.79 mg/dL — ABNORMAL HIGH (ref 0.44–1.00)
GFR, Estimated: 20 mL/min — ABNORMAL LOW (ref >60)
GFR, Estimated: 15 mL/min — ABNORMAL LOW (ref >60)
Glucose, Bld: 158 mg/dL — ABNORMAL HIGH (ref 70–99)
Glucose, Bld: 177 mg/dL — ABNORMAL HIGH (ref 70–99)
Phosphorus: 4.3 mg/dL (ref 2.5–4.6)
Phosphorus: 5.5 mg/dL — ABNORMAL HIGH (ref 2.5–4.6)
Potassium: 3.5 mmol/L (ref 3.5–5.1)
Potassium: 4.3 mmol/L (ref 3.5–5.1)
Sodium: 136 mmol/L (ref 135–145)
Sodium: 134 mmol/L — ABNORMAL LOW (ref 135–145)

## 2024-02-19 LAB — POCT I-STAT 7, (LYTES, BLD GAS, ICA,H+H)
Acid-Base Excess: 1 mmol/L (ref 0.0–2.0)
Bicarbonate: 22.5 mmol/L (ref 20.0–28.0)
Calcium, Ion: 1.09 mmol/L — ABNORMAL LOW (ref 1.15–1.40)
HCT: 33 % — ABNORMAL LOW (ref 36.0–46.0)
Hemoglobin: 11.2 g/dL — ABNORMAL LOW (ref 12.0–15.0)
O2 Saturation: 95 %
Patient temperature: 99.3
Potassium: 4.4 mmol/L (ref 3.5–5.1)
Sodium: 132 mmol/L — ABNORMAL LOW (ref 135–145)
TCO2: 23 mmol/L (ref 22–32)
pCO2 arterial: 27.7 mmHg — ABNORMAL LOW (ref 32–48)
pH, Arterial: 7.518 — ABNORMAL HIGH (ref 7.35–7.45)
pO2, Arterial: 67 mmHg — ABNORMAL LOW (ref 83–108)

## 2024-02-19 LAB — GLUCOSE, CAPILLARY
Glucose-Capillary: 115 mg/dL — ABNORMAL HIGH (ref 70–99)
Glucose-Capillary: 133 mg/dL — ABNORMAL HIGH (ref 70–99)
Glucose-Capillary: 141 mg/dL — ABNORMAL HIGH (ref 70–99)
Glucose-Capillary: 156 mg/dL — ABNORMAL HIGH (ref 70–99)
Glucose-Capillary: 179 mg/dL — ABNORMAL HIGH (ref 70–99)
Glucose-Capillary: 190 mg/dL — ABNORMAL HIGH (ref 70–99)
Glucose-Capillary: 214 mg/dL — ABNORMAL HIGH (ref 70–99)

## 2024-02-19 LAB — MAGNESIUM
Magnesium: 1.3 mg/dL — ABNORMAL LOW (ref 1.7–2.4)
Magnesium: 2.6 mg/dL — ABNORMAL HIGH (ref 1.7–2.4)

## 2024-02-19 LAB — CULTURE, BLOOD (ROUTINE X 2)
Culture: NO GROWTH
Culture: NO GROWTH

## 2024-02-19 LAB — AMMONIA: Ammonia: 23 umol/L (ref 9–35)

## 2024-02-19 LAB — PHOSPHORUS: Phosphorus: 1.5 mg/dL — ABNORMAL LOW (ref 2.5–4.6)

## 2024-02-19 MED ORDER — MAGNESIUM SULFATE 2 GM/50ML IV SOLN: 2.0000 g | Freq: Once | INTRAVENOUS | Status: DC

## 2024-02-19 MED ORDER — CARVEDILOL 3.125 MG PO TABS
6.2500 mg | ORAL_TABLET | Freq: Two times a day (BID) | ORAL | Status: DC
  Administered 2024-02-19 – 2024-02-20 (×2): 6.25 mg
  Filled 2024-02-19 (×2): qty 2

## 2024-02-19 NOTE — Progress Notes (Signed)
 STROKE TEAM PROGRESS NOTE    SIGNIFICANT HOSPITAL EVENTS 6/11- ED for nausea, vomiting after dialysis 6/12- intubated, EVD placed   INTERIM HISTORY/SUBJECTIVE Patient is more obtunded and difficult to arouse today.  Tmax was 102.5.  Discussed with Dr. Marygrace Snellen critical care medicine.  Plan to DC femoral line.  EEG shows triphasic waves and encephalopathy.  TCD was suboptimal due to poor windows but no vasospasm in identified vessels.  She remains on Zosyn  for colitis. Plan to discontinue Keppra  as there have been no definite seizures noted and EEG showed no significant irritability  OBJECTIVE  CBC    Component Value Date/Time   WBC 9.0 02/17/2024 0928   RBC 3.68 (L) 02/17/2024 0928   HGB 11.4 (L) 02/17/2024 0928   HCT 34.6 (L) 02/17/2024 0928   PLT 156 02/17/2024 0928   MCV 94.0 02/17/2024 0928   MCH 31.0 02/17/2024 0928   MCHC 32.9 02/17/2024 0928   RDW 13.6 02/17/2024 0928   LYMPHSABS 1.0 11/05/2023 0343   MONOABS 0.2 11/05/2023 0343   EOSABS 0.1 11/05/2023 0343   BASOSABS 0.0 11/05/2023 0343    BMET    Component Value Date/Time   NA 136 02/19/2024 0854   K 3.5 02/19/2024 0854   CL 92 (L) 02/19/2024 0854   CO2 24 02/19/2024 0854   GLUCOSE 158 (H) 02/19/2024 0854   BUN 36 (H) 02/19/2024 0854   CREATININE 2.94 (H) 02/19/2024 0854   CALCIUM  9.8 02/19/2024 0854   GFRNONAA 20 (L) 02/19/2024 0854    IMAGING past 24 hours CT HEAD WO CONTRAST ( ) Result Date: 02/19/2024 CLINICAL DATA:  Altered mental status and history of brain bleed. EXAM: CT HEAD WITHOUT CONTRAST TECHNIQUE: Contiguous axial images were obtained from the base of the skull through the vertex without intravenous contrast. RADIATION DOSE REDUCTION: This exam was performed according to the departmental dose-optimization program which includes automated exposure control, adjustment of the mA and/or kV according to patient size and/or use of iterative reconstruction technique. COMPARISON:  CT of the head dated  February 14, 2024. FINDINGS: Brain: There has been significant interval improvement of subarachnoid hemorrhage seen primarily within the prepontine cistern and left sylvian fissure. There is minimal residual subarachnoid hemorrhage within the right sylvian fissure. There has been interval improvement of interventricular hemorrhage. There is also been significant interval improvement of hydrocephalus, although there is continued mild ballooning of the temporal horns. A right frontal approach ventriculostomy catheter is again demonstrated with its tip abutting the septum pellucidum in the midbody of the right lateral ventricle. There is no apparent intraparenchymal hemorrhage. There is diminished density present in the left paracentral pons. There is mild periventricular white matter disease. Vascular: Calcific atheromatous disease within the carotid siphons and vertebral arteries bilaterally. Skull: Right frontal approach ventriculostomy. The calvaria is otherwise intact. Sinuses/Orbits: Status post bilateral cataract surgery. The visualized paranasal sinuses are clear. Other: None. IMPRESSION: 1. Significant interval improvement of subarachnoid and intraventricular hemorrhage. 2. Interval improvement of hydrocephalus status post placement of right frontal approach ventriculostomy catheter. Electronically Signed   By: Maribeth Shivers M.D.   On: 02/19/2024 13:48   VAS US  TRANSCRANIAL DOPPLER Result Date: 02/19/2024  Transcranial Doppler Patient Name:  [name] [name]  Date of Exam:   02/18/2024 Medical Rec #: [id number]           Accession #:    [id number] Date of Birth: 1981/01/08            Patient Gender: F Patient Age:   43  years Exam Location:  [facility] Procedure:      VAS US  TRANSCRANIAL DOPPLER Referring Phys: Fraser Jackson XU --------------------------------------------------------------------------------  Indications: Large ICH with brain compression and scattered SAH. History: ESRD, on dialysis. 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 Presented to ED with hypertensive emergency, systolic BP in the 240s. Limitations: Positioning. Limitations for diagnostic windows: Unable to insonate right transtemporal window. Performing Technologist: Carleene Chase RVS  Examination Guidelines: A complete evaluation includes B-mode imaging, spectral Doppler, color Doppler, and power Doppler as needed of all accessible portions of each vessel. Bilateral testing is considered an integral part of a complete examination. Limited examinations for reoccurring indications may be performed as noted.  +----------+---------------+----------+-----------+------------------+ RIGHT TCD Right VM (cm/s)Depth (cm)Pulsatility     Comment       +----------+---------------+----------+-----------+------------------+ MCA                                           unable to insonate +----------+---------------+----------+-----------+------------------+ ACA                                           unable to insonate +----------+---------------+----------+-----------+------------------+ Term ICA                                      unable to insonate +----------+---------------+----------+-----------+------------------+ PCA P1                                        unable to insonate +----------+---------------+----------+-----------+------------------+ Opthalmic       25                    2.01                       +----------+---------------+----------+-----------+------------------+ ICA siphon                                    unable to insonate +----------+---------------+----------+-----------+------------------+ Vertebral       -28                   1.81                       +----------+---------------+----------+-----------+------------------+  +----------+--------------+----------+-----------+------------------+ LEFT TCD  Left VM (cm/s)Depth (cm)Pulsatility      Comment       +----------+--------------+----------+-----------+------------------+ MCA             13                    1.3                       +----------+--------------+----------+-----------+------------------+ ACA            -50                   1.25                       +----------+--------------+----------+-----------+------------------+ Term ICA  38                   1.27                       +----------+--------------+----------+-----------+------------------+ PCA P1          25                   1.05                       +----------+--------------+----------+-----------+------------------+ Opthalmic       21                   1.38                       +----------+--------------+----------+-----------+------------------+ ICA siphon                                   unable to insonate +----------+--------------+----------+-----------+------------------+ Vertebral      -18                   1.25                       +----------+--------------+----------+-----------+------------------+ Distal ICA     -27                   1.48                       +----------+--------------+----------+-----------+------------------+  +------------+-------+-------+             VM cm/sComment +------------+-------+-------+ Prox Basilar  -22          +------------+-------+-------+ +----------------------+---+ Right Lindegaard RatioN/A +----------------------+---+ +---------------------+----+ Left Lindegaard Ratio0.48 +---------------------+----+  Summary:  Absent right temporal and poor left temporal, orbital and suboccipital windows limit evaluation.No definite veidence of vasospasm noted in identified blood vessels. *See table(s) above for TCD measurements and observations.  Diagnosing physician: Ardella Beaver MD Electronically signed by Ardella Beaver MD on 02/19/2024 at 12:23:57 PM.    Final    EEG adult Result Date: 02/18/2024 Arleene Lack, MD      02/18/2024  7:04 PM Patient Name: [name] [name] MRN: [id number] Epilepsy Attending: Arleene Lack Referring Physician/Provider: Audrene Lease, NP Date: 02/18/2024 Duration: 24.30 mins Patient history: 43yo F with perimesencephalic SAH with IVH. EEG to evaluate for seizure. Level of alertness:  comatose AEDs during EEG study: LEV, Prednisone  Technical aspects: This EEG study was done with scalp electrodes positioned according to the 10-20 International system of electrode placement. Electrical activity was reviewed with band pass filter of 1-70Hz , sensitivity of 7 uV/mm, display speed of 66mm/sec with a 60Hz  notched filter applied as appropriate. EEG data were recorded continuously and digitally stored.  Video monitoring was available and reviewed as appropriate. Description: EEG showed continuous generalized polymorphic sharply contoured 3 to 6 Hz theta-delta slowing. Generalized periodic discharges with triphasic morphology were also noted at 1-2 Hz, with waxing and waning morphology. Hyperventilation and photic stimulation were not performed.   ABNORMALITY - Periodic discharges with triphasic morphology, generalized ( GPDs) - Continuous slow, generalized IMPRESSION: This study showed generalized periodic discharges with triphasic morphology which can be due to toxic-metabolic etiology. However, given the frequency of discharges and the waxing and waning morphology, this pattern is on the  ictal-interictal continuum. Consider long term eeg if concern for ictal activity persists. Additionally there was severe diffuse encephalopathy. No definite seizures  were seen throughout the recording. Priyanka Suzanne Erps     Vitals:   02/19/24 1100 02/19/24 1200 02/19/24 1300 02/19/24 1400  BP: 94/62 105/64 124/71 139/61  Pulse:   (!) 110   Resp: (!) 29 (!) 28 (!) 29 (!) 29  Temp:  (!) 102.5 F (39.2 C)    TempSrc:  Axillary    SpO2:   95%   Weight:      Height:         PHYSICAL EXAM General: Lethargic  sedated but will open eyes to voice CV: Regular rate and rhythm on monitor Respiratory: Mechanically ventilated  GI: Abdomen soft and nontender   NEURO:  Mental status:on sedation, eyes closed, but will open eyes and follow simple commands with sedation turned off. Dysconjugate pupils which are small and sluggishly reactive gag and cough present.  Tongue protrusion not cooperative.  With deep sternal rub able to lift against gravity RUE and RLE. Purposeful movement of the RUE  LUE drift and unable to lift LLE of the bed.  Sensation, coordination and gait not tested.   ASSESSMENT/PLAN  [name] [name] is a 43 y.o. female with history of hypertension, ESRD on HD, s/p renal and pancreatic transplant, diabetes mellitus type 1 with gastroparesis presents with nausea and vomiting since after dialysis.  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 Intraventricular blood products layering in the occipital horns.  CTA head & neck - No large vessel occlusion. No high-grade stenosis, aneurysm, or dissection of the arteries in the head and neck. Mild stenosis of the V4 segments of both vertebral arteries. Mild stenosis of the right supraclinoid ICA.  6/12 repeat CT Head- Interval placement of a right frontal approach ventriculostomy with tip in the body of the right lateral ventricle. Persistent but relatively stable hydrocephalus at this time. Moderate volume acute subarachnoid hemorrhage with associated IVH, similar to prior. No evidence for significant interval bleeding since prior. 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  NSGY consulted EVD placement 6/12 On Keppra   ? Vasospasm  CTA head and neck Mild irregularity of the basilar artery without significant narrowing. Finding concerning for mild vasospasm. TCD monitoring pending On nimodipine    Respiratory failure  Aspiration pneumonia PCCM primary Intubated 6/12 Sedated with propofol , fentanyl  On Zosyn   Hypertensive Emergency  Home meds:  Norvasc , coreg , clonidine  patch, hydralazine   Unstable Cleviprex , labetalol  PRN BP goal < 160 Long term BP goal normotensive  Diabetes Type 1 Hx of pancreas transplant  HgbA1c 4.2, goal < 7.0 CBGs, SSI  ESRD on HD Hx of renal transplant  HD patient with fistula Nephrology on board Now on CRRT   Dysphagia NG tube in place Speech therapy consult Nutrition consult for tube feeds  Other stroke risk factors   Other medical issues DM gastroparesis  Colitis  Thrombocytopenia - platelet 109  Hospital  day # 5    02/19/2024 2:58 PM      Patient presented with vomiting and nausea following CRRT brain imaging shows perimesencephalic large subarachnoid blood with hydrocephalus and despite ventriculostomy her mental status remains quite lethargic which may be multifactorial given renal failure when this metabolic derangements.  Recommend discontinue Keppra  as she clearly did not have a seizure and it could be contributing to mental status and EEG shows no definite seizures..   Discussed with Dr. Nat Badger to postpone elective diagnostic cerebral angiogram to look for venous AVM versus aneurysm or vasospasm due to worsening medical situation.  No family available at the bedside.  Continue strict blood pressure control systolic goal below 160.  Ventriculostomy drainage as per  neurosurgery.  Discussed with Dr. Marygrace Snellen critical care medicine and Dr.  Nat Badger neurosurgery.    This patient is critically ill and at significant risk of neurological worsening, death and care requires constant monitoring of vital signs, hemodynamics,respiratory and cardiac monitoring, extensive review of multiple databases, frequent neurological assessment, discussion with family, other specialists and medical decision making of high complexity.I have made any additions or clarifications directly to the above note.This critical care time does not reflect procedure time, or teaching time or supervisory time of PA/NP/Med Resident etc but could involve care discussion time.  I spent 30 minutes of neurocritical care time  in the care of  this patient.         Ardella Beaver, MD Medical Director [address] [facility] Pager: [phone] 02/19/2024 2:58 PM     To contact Stroke Continuity provider, please refer to [email or web address]. After hours, contact General Neurology

## 2024-02-19 NOTE — Progress Notes (Signed)
 Patient ID: [name] [name], female   DOB: 09-12-1980, 43 y.o.   MRN: [id number] Subjective: The patient is somnolent and in no apparent distress.  Objective: Vital signs in last 24 hours: Temp:  [99.8 F (37.7 C)-102.2 F (39 C)] 101.2 F (38.4 C) (06/17 0315) Pulse Rate:  [87-116] 96 (06/17 0700) Resp:  [18-32] 32 (06/17 0700) BP: (83-150)/(45-73) 87/45 (06/17 0645) SpO2:  [96 %-100 %] 100 % (06/17 0700) Arterial Line BP: (107-170)/(50-70) 107/50 (06/17 0700) Estimated body mass index is 18.71 kg/m as calculated from the following:   Height as of this encounter: 5' 7.5 (1.715 m).   Weight as of this encounter: 55 kg.   Intake/Output from previous day: 06/16 0701 - 06/17 0700 In: 1450 [NG/GT:1340; IV Piggyback:110] Out: 342 [Drains:192; Stool:150] Intake/Output this shift: No intake/output data recorded.  Physical exam patient is somnolent.  She is arousable to painful stimuli.  She mumbles and moves all 4 extremities.  Her ventriculostomy continues to drain adequately.  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 EEG to evaluate for seizure. Level of alertness:  comatose AEDs during EEG study: LEV, Prednisone  Technical aspects: This EEG study was done with scalp electrodes positioned according to the 10-20 International system of electrode placement. Electrical activity was reviewed with band pass filter of 1-70Hz ,  sensitivity of 7 uV/mm, display speed of 41mm/sec with a 60Hz  notched filter applied as appropriate. EEG data were recorded continuously and digitally stored.  Video monitoring was available and reviewed as appropriate. Description: EEG showed continuous generalized polymorphic sharply contoured 3 to 6 Hz theta-delta slowing. Generalized periodic discharges with triphasic morphology were also noted at 1-2 Hz, with waxing and waning morphology. Hyperventilation and photic stimulation were not performed.   ABNORMALITY - Periodic discharges with triphasic morphology, generalized ( GPDs) - Continuous slow, generalized IMPRESSION: This study showed generalized periodic discharges with triphasic morphology which can be due to toxic-metabolic etiology. However, given the frequency of discharges and the waxing and waning morphology, this pattern is on the ictal-interictal continuum. Consider long term eeg if concern for ictal activity persists. Additionally there was severe diffuse encephalopathy. No definite seizures  were seen throughout the recording. 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  Presented to ED with hypertensive emergency, systolic BP in the 240s. Limitations: Positioning. Limitations for diagnostic windows: Unable to insonate right transtemporal window. Performing Technologist: Carleene Chase RVS  Examination  Guidelines: A complete evaluation includes B-mode imaging, spectral Doppler, color Doppler, and power Doppler as needed of all accessible portions of each vessel. Bilateral testing is considered an integral part of a complete examination. Limited examinations for reoccurring indications may be performed as noted.  +----------+---------------+----------+-----------+------------------+ RIGHT TCD Right VM (cm/s)Depth (cm)Pulsatility     Comment       +----------+---------------+----------+-----------+------------------+ MCA                                           unable to insonate +----------+---------------+----------+-----------+------------------+ ACA                                           unable to insonate +----------+---------------+----------+-----------+------------------+ Term ICA                                      unable to insonate +----------+---------------+----------+-----------+------------------+ PCA P1                                        unable to insonate +----------+---------------+----------+-----------+------------------+ Opthalmic       25                    2.01                       +----------+---------------+----------+-----------+------------------+ ICA siphon                                    unable to insonate +----------+---------------+----------+-----------+------------------+ Vertebral       -28                   1.81                       +----------+---------------+----------+-----------+------------------+  +----------+--------------+----------+-----------+------------------+ LEFT TCD  Left VM (cm/s)Depth (cm)Pulsatility     Comment       +----------+--------------+----------+-----------+------------------+ MCA             13                    1.3                       +----------+--------------+----------+-----------+------------------+ ACA            -50                   1.25                        +----------+--------------+----------+-----------+------------------+ Term ICA        38                   1.27                       +----------+--------------+----------+-----------+------------------+  PCA P1          25                   1.05                       +----------+--------------+----------+-----------+------------------+ Opthalmic       21                   1.38                       +----------+--------------+----------+-----------+------------------+ ICA siphon                                   unable to insonate +----------+--------------+----------+-----------+------------------+ Vertebral      -18                   1.25                       +----------+--------------+----------+-----------+------------------+ Distal ICA     -27                   1.48                       +----------+--------------+----------+-----------+------------------+  +------------+-------+-------+             VM cm/sComment +------------+-------+-------+ Prox Basilar  -22          +------------+-------+-------+ +----------------------+---+ Right Lindegaard RatioN/A +----------------------+---+ +---------------------+----+ Left Lindegaard Ratio0.48 +---------------------+----+    Preliminary    MR BRAIN WO CONTRAST Result Date: 02/17/2024 CLINICAL DATA:  Stroke, hemorrhagic. EXAM: MRI HEAD WITHOUT CONTRAST TECHNIQUE: Multiplanar, multiecho pulse sequences of the brain and surrounding structures were obtained without intravenous contrast. COMPARISON:  Head CT 02/14/2024 and MRI 12/24/2021 FINDINGS: The study is moderately motion degraded. Brain: There is a small acute infarct in the genu and anterior body of the corpus callosum. No definite acute infarct is identified elsewhere allowing for multifocal susceptibility artifact on diffusion-weighted imaging from blood products. A right frontal approach ventricular drain remains in place, terminating in the body of the right  lateral ventricle. The ventricles are mildly to moderately dilated, mildly improved from the most recent prior CT. Layering hemorrhage in the occipital horns of the lateral ventricles has decreased. Moderately large volume perimesencephalic subarachnoid hemorrhage centered at the level of the prepontine cistern has decreased. Scattered smaller volume supratentorial subarachnoid hemorrhage is grossly similar to the prior CT and is greatest in the left sylvian fissure. Trace subdural hemorrhage is present over the right cerebral convexity and tentorium as well as in the posterior fossa bilaterally. T2 hyperintensities in the cerebral white matter and pons have progressed from the prior MRI and are nonspecific but compatible with age advanced chronic small vessel ischemic disease. There are chronic lacunar infarcts in the bilateral basal ganglia, right greater than left thalami, and pons which have increased in number from the prior MRI. A chronic left cerebellar infarct is unchanged. There is no midline shift. Vascular: Major intracranial vascular flow voids are preserved. Skull and upper cervical spine: Unremarkable bone marrow signal. Sinuses/Orbits: Bilateral cataract extraction. Mild mucosal thickening in the right sphenoid sinus. Moderate bilateral mastoid effusions. Other: None. IMPRESSION: 1. 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 2. Mildly improved hydrocephalus with a ventricular drain in place. 3. Decreased perimesencephalic  subarachnoid hemorrhage and intraventricular hemorrhage. 4. Trace supratentorial and infratentorial subdural hemorrhage. 5. Age advanced chronic small vessel ischemic disease with chronic lacunar infarcts as above, progressed from 2023. These results will be called to the ordering clinician or representative by the Radiologist Assistant, and communication documented in the PACS or [employer]. Electronically Signed   By: Aundra Lee M.D.   On: 02/17/2024 13:49     Assessment/Plan: Subarachnoid hemorrhage, intraventricular hemorrhage, status post ventriculostomy: The patient's neurologic exam has not changed.  Ventriculostomy is functioning.  Dr. Nat Badger plans to do a cerebral arteriogram to rule out sources of recurrent bleeding.  Her admission CTA was negative.  LOS: 5 days     [name] 02/19/2024, 7:47 AM

## 2024-02-19 NOTE — Progress Notes (Signed)
 [facility] Progress Note   Subjective:  s/p IDH yesterday.  Obtunded today, CT scan showing improvement of hydrocephalus and SAH, TCDs without definitive evidence of vasospasm  Objective Vitals:   02/19/24 0930 02/19/24 1000 02/19/24 1100 02/19/24 1200  BP: 105/68 107/70 94/62 105/64  Pulse:  (!) 102    Resp: (!) 28 (!) 29 (!) 29 (!) 28  Temp:    (!) 102.5 F (39.2 C)  TempSrc:    Axillary  SpO2:  97%    Weight:      Height:       Physical Exam General: ill appearing, eyes closed, obtunded Heart: RRR Lungs: clear on [place] Abdomen:soft Extremities: minimal edema Dialysis Access: temp femoral HD catheter, RUE AV access +t/b  Additional Objective Labs: Basic Metabolic Panel: Recent Labs  Lab 02/18/24 0404 02/18/24 1718 02/19/24 0500 02/19/24 0854  NA 136 134*  --  136  K 4.3 4.7  --  3.5  CL 100 100  --  92*  CO2 15* 9*  --  24  GLUCOSE 142* 159*  --  158*  BUN 57* 80*  --  36*  CREATININE 4.11* 5.48*  --  2.94*  CALCIUM  11.2* 11.2*  --  9.8  PHOS 4.4  4.4 4.9* 1.5* 4.3   Liver Function Tests: Recent Labs  Lab 02/13/24 2109 02/14/24 0949 02/18/24 0404 02/18/24 1718 02/19/24 0854  AST 16  --   --   --   --   ALT 11  --   --   --   --   ALKPHOS 92  --   --   --   --   BILITOT 0.8  --   --   --   --   PROT 7.6  --   --   --   --   ALBUMIN 3.9   < > 3.4* 3.2* 3.3*   < > = values in this interval not displayed.   Recent Labs  Lab 02/13/24 2109  LIPASE 35   CBC: Recent Labs  Lab 02/14/24 1243 02/14/24 1716 02/14/24 1741 02/15/24 0430 02/16/24 0513 02/17/24 0928  WBC 11.2*  --  9.8 7.3 7.4 9.0  HGB 12.2   < > 10.5* 9.6* 10.2* 11.4*  HCT 37.5   < > 32.9* 30.8* 32.2* 34.6*  MCV 92.8  --  93.5 97.2 95.3 94.0  PLT 109*  --  116*  109* 119* 136* 156   < > = values in this interval not displayed.   Blood Culture    Component Value Date/Time   SDES BLOOD RIGHT HAND 02/14/2024 0949   SPECREQUEST  02/14/2024 0949    AEROBIC BOTTLE  ONLY Blood Culture results may not be optimal due to an inadequate volume of blood received in culture bottles   CULT  02/14/2024 0949    NO GROWTH 5 DAYS Performed at [facility], [address], [place], [name] [address]    REPTSTATUS 02/19/2024 FINAL 02/14/2024 0949    Cardiac Enzymes: No results for input(s): CKTOTAL, CKMB, CKMBINDEX, TROPONINI in the last 168 hours. CBG: Recent Labs  Lab 02/18/24 0725 02/18/24 1120 02/18/24 1530 02/18/24 2042 02/19/24 0018  GLUCAP 147* 157* 179* 137* 115*   Iron  Studies: No results for input(s): IRON , TIBC, TRANSFERRIN, FERRITIN in the last 72 hours. @lablastinr3 @ Studies/Results: CT HEAD WO CONTRAST ( ) Result Date: 02/19/2024 CLINICAL DATA:  Altered mental status and history of brain bleed. EXAM: CT HEAD WITHOUT CONTRAST TECHNIQUE: Contiguous axial images  were obtained from the base of the skull through the vertex without intravenous contrast. RADIATION DOSE REDUCTION: This exam was performed according to the departmental dose-optimization program which includes automated exposure control, adjustment of the mA and/or kV according to patient size and/or use of iterative reconstruction technique. COMPARISON:  CT of the head dated February 14, 2024. FINDINGS: Brain: There has been significant interval improvement of subarachnoid hemorrhage seen primarily within the prepontine cistern and left sylvian fissure. There is minimal residual subarachnoid hemorrhage within the right sylvian fissure. There has been interval improvement of interventricular hemorrhage. There is also been significant interval improvement of hydrocephalus, although there is continued mild ballooning of the temporal horns. A right frontal approach ventriculostomy catheter is again demonstrated with its tip abutting the septum pellucidum in the midbody of the right lateral ventricle. There is no apparent intraparenchymal hemorrhage. There is diminished density  present in the left paracentral pons. There is mild periventricular white matter disease. Vascular: Calcific atheromatous disease within the carotid siphons and vertebral arteries bilaterally. Skull: Right frontal approach ventriculostomy. The calvaria is otherwise intact. Sinuses/Orbits: Status post bilateral cataract surgery. The visualized paranasal sinuses are clear. Other: None. IMPRESSION: 1. Significant interval improvement of subarachnoid and intraventricular hemorrhage. 2. Interval improvement of hydrocephalus status post placement of right frontal approach ventriculostomy catheter. Electronically Signed   By: Maribeth Shivers M.D.   On: 02/19/2024 13:48   VAS US  TRANSCRANIAL DOPPLER Result Date: 02/19/2024  Transcranial Doppler Patient Name:  [name] [name]  Date of Exam:   02/18/2024 Medical Rec #: [id number]           Accession #:    [id number] Date of Birth: [birth year]            Patient Gender: F Patient Age:   43 years Exam Location:  [facility] Procedure:      VAS US  TRANSCRANIAL DOPPLER Referring Phys: Fraser Jackson XU --------------------------------------------------------------------------------  Indications: Large ICH with brain compression and scattered SAH. 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 Presented to ED with hypertensive emergency, systolic BP in the 240s. Limitations: Positioning. Limitations for diagnostic windows: Unable to insonate right transtemporal window. Performing Technologist: Carleene Chase RVS  Examination Guidelines: A complete evaluation includes B-mode imaging, spectral Doppler, color Doppler, and power Doppler as needed of all accessible portions of each vessel. Bilateral testing is considered an integral part of a complete examination. Limited examinations for reoccurring indications may be performed as noted.  +----------+---------------+----------+-----------+------------------+ RIGHT TCD Right VM  (cm/s)Depth (cm)Pulsatility     Comment       +----------+---------------+----------+-----------+------------------+ MCA                                           unable to insonate +----------+---------------+----------+-----------+------------------+ ACA                                           unable to insonate +----------+---------------+----------+-----------+------------------+ Term ICA                                      unable to insonate +----------+---------------+----------+-----------+------------------+ PCA P1  unable to insonate +----------+---------------+----------+-----------+------------------+ Opthalmic       25                    2.01                       +----------+---------------+----------+-----------+------------------+ ICA siphon                                    unable to insonate +----------+---------------+----------+-----------+------------------+ Vertebral       -28                   1.81                       +----------+---------------+----------+-----------+------------------+  +----------+--------------+----------+-----------+------------------+ LEFT TCD  Left VM (cm/s)Depth (cm)Pulsatility     Comment       +----------+--------------+----------+-----------+------------------+ MCA             13                    1.3                       +----------+--------------+----------+-----------+------------------+ ACA            -50                   1.25                       +----------+--------------+----------+-----------+------------------+ Term ICA        38                   1.27                       +----------+--------------+----------+-----------+------------------+ PCA P1          25                   1.05                       +----------+--------------+----------+-----------+------------------+ Opthalmic       21                   1.38                        +----------+--------------+----------+-----------+------------------+ ICA siphon                                   unable to insonate +----------+--------------+----------+-----------+------------------+ Vertebral      -18                   1.25                       +----------+--------------+----------+-----------+------------------+ Distal ICA     -27                   1.48                       +----------+--------------+----------+-----------+------------------+  +------------+-------+-------+             VM cm/sComment +------------+-------+-------+ Prox Basilar  -22          +------------+-------+-------+ +----------------------+---+  Right Lindegaard RatioN/A +----------------------+---+ +---------------------+----+ Left Lindegaard Ratio0.48 +---------------------+----+  Summary:  Absent right temporal and poor left temporal, orbital and suboccipital windows limit evaluation.No definite veidence of vasospasm noted in identified blood vessels. *See table(s) above for TCD measurements and observations.  Diagnosing physician: Ardella Beaver MD Electronically signed by Ardella Beaver MD on 02/19/2024 at 12:23:57 PM.    Final    EEG adult Result Date: 02/18/2024 Arleene Lack, MD     02/18/2024  7:04 PM Patient Name: [name] [name] MRN: [id number] Epilepsy Attending: Arleene Lack Referring Physician/Provider: Audrene Lease, NP Date: 02/18/2024 Duration: 24.30 mins Patient history: 43yo F with perimesencephalic SAH with IVH. EEG to evaluate for seizure. Level of alertness:  comatose AEDs during EEG study: LEV, Prednisone  Technical aspects: This EEG study was done with scalp electrodes positioned according to the 10-20 International system of electrode placement. Electrical activity was reviewed with band pass filter of 1-70Hz , sensitivity of 7 uV/mm, display speed of 75mm/sec with a 60Hz  notched filter applied as appropriate. EEG data were recorded continuously and  digitally stored.  Video monitoring was available and reviewed as appropriate. Description: EEG showed continuous generalized polymorphic sharply contoured 3 to 6 Hz theta-delta slowing. Generalized periodic discharges with triphasic morphology were also noted at 1-2 Hz, with waxing and waning morphology. Hyperventilation and photic stimulation were not performed.   ABNORMALITY - Periodic discharges with triphasic morphology, generalized ( GPDs) - Continuous slow, generalized IMPRESSION: This study showed generalized periodic discharges with triphasic morphology which can be due to toxic-metabolic etiology. However, given the frequency of discharges and the waxing and waning morphology, this pattern is on the ictal-interictal continuum. Consider long term eeg if concern for ictal activity persists. Additionally there was severe diffuse encephalopathy. No definite seizures  were seen throughout the recording. Priyanka O Yadav    Medications:  feeding supplement (OSMOLITE 1.5 CAL) 50 mL/hr at 02/19/24 1000   magnesium  sulfate bolus IVPB     piperacillin -tazobactam (ZOSYN )  IV 2.25 g (02/19/24 1342)    amLODipine   10 mg Per Tube Daily   carvedilol   6.25 mg Per Tube BID WC   Chlorhexidine  Gluconate Cloth  6 each Topical Daily   cycloSPORINE   25 mg Per Tube Daily   doxercalciferol   4 mcg Intravenous Q M,W,F-HD   fentaNYL  (SUBLIMAZE ) injection  25-50 mcg Intravenous Once   fiber supplement (BANATROL TF)  60 mL Per Tube BID   insulin  aspart  0-9 Units Subcutaneous Q4H   losartan   50 mg Per Tube Daily   mycophenolate   500 mg Per Tube BID   niMODipine   60 mg Per Tube Q4H   mouth rinse  15 mL Mouth Rinse 4 times per day   predniSONE   15 mg Per Tube Q breakfast   sodium bicarbonate   650 mg Per Tube TID   sodium chloride  flush  3 mL Intravenous Q12H   sodium chloride  flush  3-10 mL Intravenous Q12H    Dialysis Orders: [facility] MWF 3:45 hrs 180NRe 400/500 57.2 kg 2.0 K/ 2.0 Ca AVF - No heparin  -  Hectorol  4 mcg IV three times per week - Mircera 150 mcg IV q 2 weeks (last dose 02/11/2024 next dose due 02/25/2024)  Assessment/Plan:  Altered mental status secondary to ICH complicated by hydrocephalus:  Has drain in now. Management per neuro/CCM.  MRI showed improvement of hydrocephalus and limiting sedating meds.   Acute hypoxic respiratory failure- improved with UF.  Also on pip/tazo for aspiration.   ESRD -  MWF HD typically, maintained on CRRT since 6/12.  HD 6/16, next 6/18  Hypertension/volume  - Chronic difficult to manage HTN. Bp goals per CCM currently. Cleviprex  now off, on enteral meds.   Anemia  - HGB stable 10s. Recent ESA. Follow labs.   Metabolic bone disease -  Chronic noncompliance with binders. Phos currently in 3s. Resume binder when needed. On VDRA.   Nutrition - Albumin OK. Enteral nutrition currently.   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 Continues on immunosuppressant [place] for pancreas.  Pharm provided liquid dosing equivalents with NGT.  Followed by [facility].  Sepsis: keeps spiking fevers- fem cath out.  Leandra Pro MD 02/19/2024, 2:06 PM  [facility]

## 2024-02-19 NOTE — Progress Notes (Signed)
 PT Cancellation Note  Patient Details Name: [name] [name] MRN: [id number] DOB: 07-Dec-1980   Cancelled Treatment:    Reason Eval/Treat Not Completed: Medical issues which prohibited therapy throughout the day. Spoke with nursing in AM and PM. HD cath had yet to be removed in the AM so therapies continued to hold. Then in PM, although cath was removed, pt remained unresponsive and lethargic unable to fully participate in PT evaluation. Will check back tomorrow, if no change in responsiveness may sign off and await orders when appropriate.   Barnabas Booth, PT, DPT   Acute Rehabilitation Department Office [phone] Secure Chat Communication Preferred   Lona Rist 02/19/2024, 4:02 PM

## 2024-02-19 NOTE — Progress Notes (Signed)
 OT Cancellation Note  Patient Details Name: [name] [name] MRN: [id number] DOB: 1980/12/13   Cancelled Treatment:    Reason Eval/Treat Not Completed: Patient not medically ready;Patient's level of consciousness Spoke with nursing in AM and PM. HD cath had yet to be removed in the AM. Although cath was removed in the afternoon, pt remained unresponsive and lethargic unable to participate fully in OT evaluation at this time. Will hold eval this date and follow up with pt one more time to assess appropriateness.    Carollee Circle, OTR/L,CBIS  Supplemental OT - [facility] and [facility] Secure Chat Preferred   02/19/2024, 3:08 PM

## 2024-02-19 NOTE — Progress Notes (Signed)
 I was asked to see this patient admitted with primarily pos fossa SAH and initial CTA negative for intracranial aneurysm/AVM. While she would benefit from formal catheter angiogram, at this point she remains obtunded likely toxic/metabolic etiology and it is unlikely we can obtain a good quality diagnostic angiogram in her current condition. Will plan on angiogram once she improves hopefully in the next few days.  Augusto Blonder, MD [facility]

## 2024-02-19 NOTE — Progress Notes (Signed)
 eLink Physician-Brief Progress Note Patient Name: [name] [name] DOB: 12-13-1980 MRN: [id number]   Date of Service  02/19/2024  HPI/Events of Note  Currently receiving hemodialysis with a goal to remove 2 L, borderline blood pressures  eICU Interventions  Continue with dialysis, wide pulse pressures noted.  Maintain SBP greater than 90, no intervention for now     Intervention Category Minor Interventions: Routine modifications to care plan (e.g. PRN medications for pain, fever)  [name] [name] 02/19/2024, 4:44 AM

## 2024-02-19 NOTE — Progress Notes (Signed)
   02/19/24 0700  Vitals  BP (!) 88/56  MAP (mmHg) (!) 64  BP Location Left Arm  BP Method Automatic  Patient Position (if appropriate) Lying  Pulse Rate 96  Pulse Rate Source Monitor  ECG Heart Rate 89  Resp (!) 32  Oxygen  Therapy  SpO2 100 %  During Treatment Monitoring  Blood Flow Rate (mL/min) 199 mL/min  Arterial Pressure (mmHg) -85.65 mmHg  Venous Pressure (mmHg) 243.02 mmHg  TMP (mmHg) 7.47 mmHg  Ultrafiltration Rate (mL/min) 0 mL/min  Dialysate Flow Rate (mL/min) 299 ml/min  Dialysate Potassium Concentration 2  Dialysate Calcium  Concentration 2.5  Duration of HD Treatment -hour(s) 3.41 hour(s)  Cumulative Fluid Removed (mL) per Treatment  852.85  HD Safety Checks Performed Yes  Intra-Hemodialysis Comments Tx completed  Post Treatment  Dialyzer Clearance Heavily streaked  Liters Processed 71.5  Fluid Removed (mL) 900 mL  Post-Hemodialysis Comments  (Blood retuen early due to clotting and low blood pressure)  AVG/AVF Arterial Site Held (minutes) 10 minutes  AVG/AVF Venous Site Held (minutes) 10 minutes  Fistula / Graft Right Upper arm Arteriovenous fistula  Placement Date/Time: (c) 12/08/20 1403   Placed prior to admission: Yes  Orientation: Right  Access Location: Upper arm  Access Type: Arteriovenous fistula  Site Condition No complications  Fistula / Graft Assessment Present;Thrill;Bruit  Status Deaccessed  Needle Size 15  Drainage Description None  Hemodialysis Catheter Right Femoral vein Triple lumen Temporary (Non-Tunneled)  Placement Date/Time: 02/14/24 1647   Placed prior to admission: No  Time Out: Correct patient;Correct site;Correct procedure  Maximum sterile barrier precautions: Hand hygiene;Large sterile sheet;Sterile probe cover;Cap;Mask;Sterile gown;Sterile glove...  Site Condition No complications

## 2024-02-19 NOTE — Progress Notes (Addendum)
 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 Hypertensive in ED SBP 240s. Went for CT a/p and was c/f some colitis. Started on abx, given IVF. C/o pain and was very agitated, rcvd pain meds. Admitted 6/12. Over the course of the day she became less responsive. Remained hypertensive w SBPs >200. Incr O2 req to [facility]. Pt was to rcv HD 6/12 afternoon but when eval by HD NP, concerns for airway protection. Rcvd narcan  with only modest improvement. PCCM consulted in this setting    Pertinent  Medical History  Failed renal / pancreas txp ESRD  HTN DM1 gastroparesis   Significant Hospital Events: Including procedures, antibiotic start and stop dates in addition to other pertinent events   6/11 ED w n/v AMS. Abx IVF for colitis 6/12 admit. AMS decr LOC. Incr O2 req. PCCM consult and code stroke called seen w rapid response nephro NP then neuro  6/13 intubated with EVD, responding to commands  6/14 extubated  Interim History / Subjective:  Remains febrile to 101.2 overnight. Felt to be 2/2 aspiration vs colitis.  BP on soft side with HD, unable to pull 2L which was goal, only around 900cc. Did receive Labetalol  prior to this as HD wasn't expected overnight.  Objective    Blood pressure (!) 87/45, pulse 96, temperature (!) 101.2 F (38.4 C), temperature source Axillary, resp. rate (!) 32, height 5' 7.5 (1.715 m), weight 55 kg, last menstrual period 02/13/2024, SpO2 100%.        Intake/Output Summary (Last 24 hours) at 02/19/2024 0742 Last data filed at 02/19/2024 0700 Gross per 24 hour  Intake 1450 ml  Output 342 ml  Net 1108 ml   Filed Weights   02/16/24 0500 02/17/24 0418  02/18/24 0402  Weight: 55 kg 54.8 kg 55 kg    Examination: Acutely ill appearing, laying flat in bed, on nasal cannula Breathing non labored RRR, systolic murmur Somnolent but opens eyes to loud voice and sternal rub, does not follow commands, right sided hemiparesis  Assessment and Plan   Large intracranial hemorrhage with brain compression, ICH score 4 - CTH with large multi-compartment ICH with EVD placement and 5-15cc/hr output Scattered SAH  Communicating hydrocephalus  Hypertensive emergency  Hx prior ischemic stroke  -NSGY following/managing, appreciate the assistance -EVD per NSGY -BP controlled off cleviprex  -Continue Amlodipine , Carvedilol , Hydralazine  PRN, Labetalol  PRN, Losartan  -continue Keppra  and Nimodipine   Acute respiratory failure w hypoxia Aspiration PNA  Pulmonary edema  -Continue supplemental O2 as needed to maintain SpO2 > 92%. -Continue Zosyn . -Bronchial hygiene. -Volume removal per HD.  Colitis  -continue Zosyn  for colitis total 8 day course (through 6/19)  ESRD on HD  AGMA Hx failed renal txp & s/p pancreatic txp (2014) on chronic immunosuppression Hypercalcemia -dialysis per nephrology, appreciate the assistance -D/c HD cath given fevers but doubt this is source -Continue HCO3 tabs -continue immunosuppresants  DM1 (sp pancreas txp) -monitor CBGs, SSI -continue mycophenolate  and pred 1    Best Practice (right click and Reselect all SmartList Selections daily)   Diet/type: NPO with tube feeds DVT prophylaxis SCD Pressure ulcer(s): pressure ulcer assessment deferred  GI prophylaxis:  PPI Lines: N/A Foley:  N/A Code Status:  full code Last date of multidisciplinary goals of care discussion [friend updated 6/12. Patient's family [place] ]   Critical care time:  35 min.    Rafael Bun, PA - [facility] For pager details, please see AMION or use Epic chat  After 1900, please call [facility] for  cross coverage needs 02/19/2024, 8:09 AM

## 2024-02-20 ENCOUNTER — Inpatient Hospital Stay (HOSPITAL_COMMUNITY)

## 2024-02-20 DIAGNOSIS — R509 Fever, unspecified: Secondary | ICD-10-CM

## 2024-02-20 DIAGNOSIS — I615 Nontraumatic intracerebral hemorrhage, intraventricular: Secondary | ICD-10-CM | POA: Diagnosis not present

## 2024-02-20 DIAGNOSIS — I161 Hypertensive emergency: Secondary | ICD-10-CM | POA: Diagnosis not present

## 2024-02-20 DIAGNOSIS — I608 Other nontraumatic subarachnoid hemorrhage: Secondary | ICD-10-CM | POA: Diagnosis not present

## 2024-02-20 DIAGNOSIS — G935 Compression of brain: Secondary | ICD-10-CM | POA: Diagnosis not present

## 2024-02-20 DIAGNOSIS — I609 Nontraumatic subarachnoid hemorrhage, unspecified: Secondary | ICD-10-CM | POA: Diagnosis not present

## 2024-02-20 DIAGNOSIS — J9601 Acute respiratory failure with hypoxia: Secondary | ICD-10-CM | POA: Diagnosis not present

## 2024-02-20 DIAGNOSIS — J810 Acute pulmonary edema: Secondary | ICD-10-CM | POA: Diagnosis not present

## 2024-02-20 LAB — MAGNESIUM: Magnesium: 3.1 mg/dL — ABNORMAL HIGH (ref 1.7–2.4)

## 2024-02-20 LAB — CBC WITH DIFFERENTIAL/PLATELET
Abs Immature Granulocytes: 0.09 10*3/uL — ABNORMAL HIGH (ref 0.00–0.07)
Basophils Absolute: 0 10*3/uL (ref 0.0–0.1)
Basophils Relative: 0 %
Eosinophils Absolute: 0 10*3/uL (ref 0.0–0.5)
Eosinophils Relative: 0 %
HCT: 34.6 % — ABNORMAL LOW (ref 36.0–46.0)
Hemoglobin: 11.3 g/dL — ABNORMAL LOW (ref 12.0–15.0)
Immature Granulocytes: 1 %
Lymphocytes Relative: 13 %
Lymphs Abs: 1.2 10*3/uL (ref 0.7–4.0)
MCH: 30.4 pg (ref 26.0–34.0)
MCHC: 32.7 g/dL (ref 30.0–36.0)
MCV: 93 fL (ref 80.0–100.0)
Monocytes Absolute: 0.7 10*3/uL (ref 0.1–1.0)
Monocytes Relative: 8 %
Neutro Abs: 7.3 10*3/uL (ref 1.7–7.7)
Neutrophils Relative %: 78 %
Platelets: 180 10*3/uL (ref 150–400)
RBC: 3.72 MIL/uL — ABNORMAL LOW (ref 3.87–5.11)
RDW: 15.4 % (ref 11.5–15.5)
WBC: 9.4 10*3/uL (ref 4.0–10.5)
nRBC: 0 % (ref 0.0–0.2)

## 2024-02-20 LAB — RENAL FUNCTION PANEL
Albumin: 3.1 g/dL — ABNORMAL LOW (ref 3.5–5.0)
Albumin: 3.4 g/dL — ABNORMAL LOW (ref 3.5–5.0)
Anion gap: 25 — ABNORMAL HIGH (ref 5–15)
Anion gap: 17 — ABNORMAL HIGH (ref 5–15)
BUN: 72 mg/dL — ABNORMAL HIGH (ref 6–20)
BUN: 26 mg/dL — ABNORMAL HIGH (ref 6–20)
CO2: 18 mmol/L — ABNORMAL LOW (ref 22–32)
CO2: 27 mmol/L (ref 22–32)
Calcium: 10.7 mg/dL — ABNORMAL HIGH (ref 8.9–10.3)
Calcium: 10.2 mg/dL (ref 8.9–10.3)
Chloride: 92 mmol/L — ABNORMAL LOW (ref 98–111)
Chloride: 92 mmol/L — ABNORMAL LOW (ref 98–111)
Creatinine, Ser: 5.49 mg/dL — ABNORMAL HIGH (ref 0.44–1.00)
Creatinine, Ser: 2.23 mg/dL — ABNORMAL HIGH (ref 0.44–1.00)
GFR, Estimated: 9 mL/min — ABNORMAL LOW (ref >60)
GFR, Estimated: 28 mL/min — ABNORMAL LOW (ref >60)
Glucose, Bld: 162 mg/dL — ABNORMAL HIGH (ref 70–99)
Glucose, Bld: 155 mg/dL — ABNORMAL HIGH (ref 70–99)
Phosphorus: 7.4 mg/dL — ABNORMAL HIGH (ref 2.5–4.6)
Phosphorus: 3 mg/dL (ref 2.5–4.6)
Potassium: 4.5 mmol/L (ref 3.5–5.1)
Potassium: 4 mmol/L (ref 3.5–5.1)
Sodium: 135 mmol/L (ref 135–145)
Sodium: 136 mmol/L (ref 135–145)

## 2024-02-20 LAB — GLUCOSE, CAPILLARY
Glucose-Capillary: 149 mg/dL — ABNORMAL HIGH (ref 70–99)
Glucose-Capillary: 128 mg/dL — ABNORMAL HIGH (ref 70–99)
Glucose-Capillary: 182 mg/dL — ABNORMAL HIGH (ref 70–99)
Glucose-Capillary: 144 mg/dL — ABNORMAL HIGH (ref 70–99)
Glucose-Capillary: 175 mg/dL — ABNORMAL HIGH (ref 70–99)

## 2024-02-20 LAB — HEMOGLOBIN AND HEMATOCRIT, BLOOD
HCT: 34.7 % — ABNORMAL LOW (ref 36.0–46.0)
Hemoglobin: 11.5 g/dL — ABNORMAL LOW (ref 12.0–15.0)

## 2024-02-20 LAB — PLATELET COUNT: Platelets: 179 10*3/uL (ref 150–400)

## 2024-02-20 LAB — PROTIME-INR
INR: 1.3 — ABNORMAL HIGH (ref 0.8–1.2)
Prothrombin Time: 16.1 s — ABNORMAL HIGH (ref 11.4–15.2)

## 2024-02-20 LAB — PROCALCITONIN: Procalcitonin: 1.83 ng/mL

## 2024-02-20 LAB — APTT: aPTT: 29 s (ref 24–36)

## 2024-02-20 LAB — FIBRINOGEN: Fibrinogen: 491 mg/dL — ABNORMAL HIGH (ref 210–475)

## 2024-02-20 MED ORDER — ORAL CARE MOUTH RINSE: 15.0000 mL | OROMUCOSAL | Status: DC | PRN

## 2024-02-20 MED ORDER — ORAL CARE MOUTH RINSE
15.0000 mL | OROMUCOSAL | Status: DC
  Administered 2024-02-20 (×3): 15 mL via OROMUCOSAL

## 2024-02-20 MED ORDER — ORAL CARE MOUTH RINSE: 15.0000 mL | OROMUCOSAL | Status: DC

## 2024-02-20 MED ORDER — METHYLPHENIDATE HCL 5 MG PO TABS
5.0000 mg | ORAL_TABLET | Freq: Once | ORAL | Status: AC
  Administered 2024-02-20: 5 mg
  Filled 2024-02-20: qty 1

## 2024-02-20 MED ORDER — SIMETHICONE 40 MG/0.6ML PO SUSP
40.0000 mg | Freq: Four times a day (QID) | ORAL | Status: DC | PRN
  Administered 2024-02-20 – 2024-02-21 (×2): 40 mg
  Filled 2024-02-20 (×3): qty 0.6

## 2024-02-20 NOTE — Evaluation (Signed)
 Physical Therapy Evaluation Patient Details Name: [name] [name] MRN: [id number] DOB: [birth year] Today's Date: 02/20/2024  History of Present Illness  [name] [name] is a 26 female who presented to the ED 6/11 with n/v, abd pain after HD. 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 CRRT 6/13- 6/15. 6/12 EVD placed. 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 Prior to admission the pt was completely independent, walking without DME, living alone in ground floor apt, and driving herself to HD. The pt now presents with limitations in functional mobility, strength, power, coordination, seated balance, and activity tolerance due to above dx, and will continue to benefit from skilled PT to address these deficits. The pt required assist to manage LE movements and positioning as well as trunk elevation to achieve sitting EOB, then was dependent on max-totalA to maintain sitting EOB. She was able to follow cues for LE movements (better with RLE than LLE) and movement at feet/toes, but was unable to move against gravity or follow cues for MMT. The pt fatigued quickly with sitting balance, but given prior level of function, will benefit from intensive therapies after d/c to facilitate return of strength, balance, endurance, and general independence with mobility.          If plan is discharge home, recommend the following: Two people to help with walking and/or transfers;Two people to help with bathing/dressing/bathroom;Assistance with cooking/housework;Assistance with feeding;Direct supervision/assist for medications management;Direct supervision/assist for financial management;Assist for transportation;Help with stairs or ramp for entrance;Supervision due to cognitive status   Can travel by private vehicle         Equipment Recommendations Wheelchair cushion (measurements PT);Wheelchair (measurements PT);Hospital bed  Recommendations for Other Services  Rehab consult    Functional Status Assessment Patient has had a recent decline in their functional status and demonstrates the ability to make significant improvements in function in a reasonable and predictable amount of time.     Precautions / Restrictions Precautions Precautions: Fall Recall of Precautions/Restrictions: Impaired Precaution/Restrictions Comments: EVD, cortrak Restrictions Weight Bearing Restrictions Per Provider Order: No      Mobility  Bed Mobility Overal bed mobility: Needs Assistance Bed Mobility: Rolling, Sidelying to Sit, Sit to Supine Rolling: Total assist, +2 for physical assistance, +2 for safety/equipment Sidelying to sit: Total assist, +2 for physical assistance, +2 for safety/equipment   Sit to supine: Total assist, +2 for physical assistance, +2 for safety/equipment   General bed mobility comments: dependent on assist to complete movment of LE and balance at EOB    Transfers                   General transfer comment: deferred due to poor core balance at EOB and fatigue   Modified Rankin (Stroke Patients Only) Modified Rankin (Stroke Patients Only) Pre-Morbid Rankin Score: No symptoms Modified Rankin: Severe disability     Balance Overall balance assessment: Needs assistance Sitting-balance support: Feet supported Sitting balance-Leahy Scale: Poor Sitting balance - Comments: max-total A for sitting balance Postural control: Posterior lean, Left lateral lean Standing balance support: No upper extremity supported                                 Pertinent Vitals/Pain Pain Assessment Pain Assessment: Faces Faces Pain Scale: No hurt Pain  Intervention(s): Limited activity within patient's tolerance, Monitored during session    Home Living Family/patient expects to be  discharged to:: Private residence Living Arrangements: Alone Available Help at Discharge: Available PRN/intermittently;Friend(s) Type of Home: Apartment Home Access: Level entry       Home Layout: One level Home Equipment: Agricultural consultant (2 wheels)      Prior Function Prior Level of Function : Independent/Modified Independent             Mobility Comments: per previous admission: walks dog, no falls, no AD use ADLs Comments: Drives to dialysis MWF     Extremity/Trunk Assessment   Upper Extremity Assessment Upper Extremity Assessment: Defer to OT evaluation RUE Deficits / Details: minimal movement antigravity, difficult to assess due to cognition. no attempt to functionally use R RUE Coordination: decreased gross motor;decreased fine motor LUE Deficits / Details: trace activation at hand otherwise faccid LUE Coordination: decreased gross motor;decreased fine motor    Lower Extremity Assessment Lower Extremity Assessment: RLE deficits/detail;LLE deficits/detail RLE Deficits / Details: attempting to follow commands for LAQ, and movement at toes, limited assessment due to inconsistent command following, generally better than activation in LLE RLE Sensation: WNL RLE Coordination: decreased fine motor;decreased gross motor LLE Deficits / Details: pt with activation at toes and knee, but limited antigravity movement LLE Coordination: decreased gross motor;decreased fine motor    Cervical / Trunk Assessment Cervical / Trunk Assessment: Kyphotic  Communication   Communication Communication: Impaired    Cognition Arousal: Lethargic Behavior During Therapy: Flat affect   PT - Cognitive impairments: No family/caregiver present to determine baseline, Awareness, Attention, Initiation, Sequencing, Problem solving, Safety/Judgement                       PT - Cognition Comments: pt lethargic with eyes closed most of session, increased time to follow commands, able to  answer simple questiosn Following commands: Impaired Following commands impaired: Follows one step commands inconsistently     Cueing Cueing Techniques: Verbal cues     General Comments General comments (skin integrity, edema, etc.): VSS on RA, BM with peri care. EVD clamped for session        Assessment/Plan    PT Assessment Patient needs continued PT services  PT Problem List Decreased strength;Decreased range of motion;Decreased activity tolerance;Decreased balance;Decreased mobility;Decreased coordination;Decreased cognition;Decreased safety awareness       PT Treatment Interventions DME instruction;Gait training;Stair training;Functional mobility training;Therapeutic activities;Therapeutic exercise;Balance training;Neuromuscular re-education;Cognitive remediation;Patient/family education    PT Goals (Current goals can be found in the Care Plan section)  Acute Rehab PT Goals Patient Stated Goal: to rest PT Goal Formulation: With patient Time For Goal Achievement: 03/05/24 Potential to Achieve Goals: Good    Frequency Min 3X/week     Co-evaluation   Reason for Co-Treatment: Complexity of the patient's impairments (multi-system involvement);To address functional/ADL transfers;For patient/therapist safety PT goals addressed during session: Mobility/safety with mobility;Balance;Strengthening/ROM OT goals addressed during session: ADL's and self-care       AM-PAC PT 6 Clicks Mobility  Outcome Measure Help needed turning from your back to your side while in a flat bed without using bedrails?: Total Help needed moving from lying on your back to sitting on the side of a flat bed without using bedrails?: Total Help needed moving to and from a bed to a chair (including a wheelchair)?: Total Help needed standing up from a chair using your arms (e.g., wheelchair or bedside chair)?: Total Help needed to walk in hospital room?:  Total Help needed climbing 3-5 steps with a  railing? : Total 6 Click Score: 6    End of Session   Activity Tolerance: Patient tolerated treatment well;Patient limited by fatigue Patient left: in bed;with call bell/phone within reach;with bed alarm set Nurse Communication: Mobility status PT Visit Diagnosis: Unsteadiness on feet (R26.81);Other abnormalities of gait and mobility (R26.89);Muscle weakness (generalized) (M62.81);Hemiplegia and hemiparesis Hemiplegia - Right/Left: Left Hemiplegia - dominant/non-dominant: Non-dominant Hemiplegia - caused by: Other Nontraumatic intracranial hemorrhage    Time: 7829-5621 PT Time Calculation (min) (ACUTE ONLY): 32 min   Charges:   PT Evaluation $PT Eval High Complexity: 1 High   PT General Charges $$ ACUTE PT VISIT: 1 Visit         Barnabas Booth, PT, DPT   Acute Rehabilitation Department Office [phone] Secure Chat Communication Preferred  Lona Rist 02/20/2024, 4:27 PM

## 2024-02-20 NOTE — Progress Notes (Signed)
 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 Hypertensive in ED SBP 240s. Went for CT a/p and was c/f some colitis. Started on abx, given IVF. C/o pain and was very agitated, rcvd pain meds. Admitted 6/12. Over the course of the day she became less responsive. Remained hypertensive w SBPs >200. Incr O2 req to [facility]. Pt was to rcv HD 6/12 afternoon but when eval by HD NP, concerns for airway protection. Rcvd narcan  with only modest improvement. PCCM consulted in this setting    Pertinent  Medical History  Failed renal / pancreas txp ESRD  HTN DM1 gastroparesis   Significant Hospital Events: Including procedures, antibiotic start and stop dates in addition to other pertinent events   6/11 ED w n/v AMS. Abx IVF for colitis 6/12 admit. AMS decr LOC. Incr O2 req. PCCM consult and code stroke called seen w rapid response nephro NP then neuro  6/13 intubated with EVD, responding to commands  6/14 extubated 6/18: Dialysis, fevering, EVD at 10 (129 mL last 24 hrs)  Interim History / Subjective:  Remains febrile, HD plan today (MWF), TCDs today (MWF)  Objective    Blood pressure (!) 140/67, pulse (!) 109, temperature (!) 101.4 F (38.6 C), temperature source Axillary, resp. rate (!) 27, height 5' 7.5 (1.715 m), weight 53.9 kg, last menstrual period 02/13/2024, SpO2 100%.        Intake/Output Summary (Last 24 hours) at 02/20/2024 0740 Last data filed at 02/20/2024 0700 Gross per 24 hour  Intake 1353.03 ml  Output 125 ml  Net 1228.03 ml   Filed Weights   02/17/24 0418 02/18/24 0402 02/20/24 0455  Weight: 54.8 kg 55 kg 53.9 kg    Examination: General: acute and chronically  ill appearing, resting in bed, in no obvious distress Neuro: opens eyes to voice, following commands in all four extremities, states a few words at a time, confused and lethargic HEENT: normocephalic, cortrak in place in L nare, CVC in R internal jugular, EVD on the right, mucous membranes dry Resp: Clear breath sounds, RRR, no use of accessory muscles CV: Regular rate, regular rhythm, palpable pulses in all, Fistula present in RUE GI: active bowel sounds, abdomen soft and non-tender GU: unable to visualize urine Skin: warm and dry to palpation, no wounds viable  Resolved problem list   Hypertensive emergency  Acute respiratory failure w hypoxia Assessment and Plan   Large intracranial hemorrhage with brain compression, ICH score 4  Scattered SAH, Communicating hydrocephalus: EVD placement  Hx prior ischemic stroke w/ on-going decreased LOC plan NSGY following/managing EVD per NSGY Continue systolic <160 continue Keppra  and Nimodipine  Need angio; awaiting improved mental status PT/OT/Speech  Ongoing fever Plan Blood cultures CBC w/diff Pro cal Tylenol  650 mg  Hypertension plan Continue Amlodipine , Carvedilol , Hydralazine  PRN, Labetalol  PRN, Losartan   Aspiration PNA  Pulmonary edema  plan SpO2 > 92%. Continue Zosyn  (6/19 end date) Pulmonary hygiene  Volume removal per HD. AM CXR  Colitis  plan continue Zosyn  for 8 day course (through 6/19)  ESRD on HD  AGMA (persistent) etiology not clear  Hx failed renal txp Hypercalcemia plan dialysis per nephrology Continue  HCO3 tabs RFP AM prior to Dialysis  Daily CMP  Type 1 Diabetes S/P Pancreatic transplant on chronic immunosuppression plan Monitor blood glucose SSI  continue mycophenolate  and prednisone     Best Practice (right click and Reselect all SmartList Selections daily)   Diet/type: NPO with tube feeds DVT prophylaxis SCD Pressure ulcer(s): pressure ulcer assessment deferred  GI prophylaxis:  PPI Lines: N/A Foley:  N/A Code Status:  full code Last date of multidisciplinary goals of care discussion [friend updated 6/12. Patient's [place] ]   Critical care time:  per attending   The note above was created by A Pate S-NP, I reviewed and made revision where needed.   Hadley Leu ACNP-BC [facility] Pulmonary/Critical Care Pager # [phone] OR # [phone] if no answer

## 2024-02-20 NOTE — Evaluation (Signed)
 Occupational Therapy Evaluation Patient Details Name: [name] [name] MRN: [id number] DOB: 1980-09-21 Today's Date: 02/20/2024   History of Present Illness   [name] [name] is a 76 female who presented to the ED 6/11 with n/v, abd pain after HD. 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 CRRT 6/13- 6/15. 6/12 EVD placed. 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 Ladona was evaluated s/p the above admission list. Per chart review pt is indep, lives alone in an apartment and drives herself to HD at baseline. Upon evaluation the pt was limited by lethargy, impaired cognition, global weakness L>R, incontinent BM, poor sitting balance and limited tolerance for activity. Overall she required total A for all tasks assessed including bed mobility, sitting and ADLs. Pt was able to follow simple simple commands with increased time and multimodal cues. Pt will benefit from continued acute OT services and intensive inpatient follow up therapy, >3 hours/day after discharge.       If plan is discharge home, recommend the following:   A lot of help with walking and/or transfers;Two people to help with walking and/or transfers;A lot of help with bathing/dressing/bathroom;Two people to help with bathing/dressing/bathroom;Assistance with cooking/housework;Assistance with feeding;Direct supervision/assist for medications management;Direct supervision/assist for financial management;Assist for transportation;Supervision due to cognitive status;Help with stairs or ramp for entrance     Functional Status Assessment   Patient has had a recent decline in their functional status and demonstrates the ability to make significant improvements in function in a reasonable and predictable amount of time.     Equipment Recommendations   Other  (comment)     Recommendations for Other Services   Rehab consult     Precautions/Restrictions   Precautions Precautions: Fall Precaution/Restrictions Comments: EVD, cortrak Restrictions Weight Bearing Restrictions Per Provider Order: No     Mobility Bed Mobility Overal bed mobility: Needs Assistance Bed Mobility: Rolling, Sidelying to Sit, Sit to Supine Rolling: Total assist, +2 for physical assistance, +2 for safety/equipment Sidelying to sit: Total assist, +2 for physical assistance, +2 for safety/equipment   Sit to supine: Total assist, +2 for physical assistance, +2 for safety/equipment        Transfers                          Balance Overall balance assessment: Needs assistance Sitting-balance support: Feet supported Sitting balance-Leahy Scale: Poor Sitting balance - Comments: max-total A for sitting balance                                   ADL either performed or assessed with clinical judgement   ADL Overall ADL's : Needs assistance/impaired Eating/Feeding: NPO   Grooming: Total assistance   Upper Body Bathing: Total assistance   Lower Body Bathing: Total assistance;+2 for physical assistance;+2 for safety/equipment   Upper Body Dressing : Total assistance   Lower Body Dressing: Total assistance;+2 for physical assistance;+2 for safety/equipment   Toilet Transfer: Total assistance;+2 for physical assistance;+2 for safety/equipment   Toileting- Clothing Manipulation and Hygiene: Total assistance;+2 for physical assistance;+2 for safety/equipment       Functional mobility during ADLs: Total assistance;+2 for physical assistance;+2 for safety/equipment General ADL Comments: limited by lethargy, cognition L weakness     Vision Baseline Vision/History: 0 No visual  deficits Vision Assessment?: Vision impaired- to be further tested in functional context     Perception Perception: Not tested       Praxis Praxis: Not  tested       Pertinent Vitals/Pain Pain Assessment Pain Assessment: Faces Faces Pain Scale: No hurt Pain Intervention(s): Limited activity within patient's tolerance, Monitored during session     Extremity/Trunk Assessment Upper Extremity Assessment Upper Extremity Assessment: LUE deficits/detail;RUE deficits/detail RUE Deficits / Details: minimal movement antigravity, difficult to assess due to cognition. no attempt to functionally use R RUE Coordination: decreased gross motor;decreased fine motor LUE Deficits / Details: trace activation at hand otherwise faccid LUE Coordination: decreased gross motor;decreased fine motor   Lower Extremity Assessment Lower Extremity Assessment: Overall WFL for tasks assessed   Cervical / Trunk Assessment Cervical / Trunk Assessment: Kyphotic   Communication Communication Communication: Impaired   Cognition Arousal: Lethargic Behavior During Therapy: Flat affect Cognition: Cognition impaired             OT - Cognition Comments: Lethargic, eyes cloesd for most of the session. pt needed increased time to follow commands or respond to questions                 Following commands: Impaired Following commands impaired: Follows one step commands inconsistently     Cueing  General Comments   Cueing Techniques: Verbal cues  VSS, BM with peri care. EVD clamped for session   Exercises     Shoulder Instructions      Home Living Family/patient expects to be discharged to:: Private residence Living Arrangements: Alone Available Help at Discharge: Available PRN/intermittently;Friend(s) Type of Home: Apartment Home Access: Level entry     Home Layout: One level     Bathroom Shower/Tub: Chief Strategy Officer: Standard Bathroom Accessibility: Yes   Home Equipment: Agricultural consultant (2 wheels)          Prior Functioning/Environment Prior Level of Function : Independent/Modified Independent              Mobility Comments: per previous admission: walks dog, no falls, no AD use ADLs Comments: Drives to dialysis MWF    OT Problem List: Decreased strength;Decreased range of motion;Decreased activity tolerance;Impaired balance (sitting and/or standing);Decreased coordination;Decreased cognition;Decreased safety awareness;Decreased knowledge of precautions;Decreased knowledge of use of DME or AE;Pain;Impaired UE functional use   OT Treatment/Interventions: Therapeutic exercise;Self-care/ADL training;Neuromuscular education;DME and/or AE instruction;Therapeutic activities;Balance training      OT Goals(Current goals can be found in the care plan section)   Acute Rehab OT Goals Patient Stated Goal: unable to state OT Goal Formulation: With patient Time For Goal Achievement: 03/05/24 Potential to Achieve Goals: Good ADL Goals Pt Will Perform Grooming: with min assist Pt Will Perform Upper Body Dressing: with min assist Pt Will Transfer to Toilet: with mod assist;stand pivot transfer;bedside commode Additional ADL Goal #1: Pt will complete bed mobility with mod A as a precursor to ADLs   OT Frequency:  Min 2X/week    Co-evaluation PT/OT/SLP Co-Evaluation/Treatment: Yes Reason for Co-Treatment: Complexity of the patient's impairments (multi-system involvement);To address functional/ADL transfers;For patient/therapist safety   OT goals addressed during session: ADL's and self-care      AM-PAC OT 6 Clicks Daily Activity     Outcome Measure Help from another person eating meals?: Total Help from another person taking care of personal grooming?: Total Help from another person toileting, which includes using toliet, bedpan, or urinal?: Total Help from another person bathing (including washing, rinsing, drying)?: Total  Help from another person to put on and taking off regular upper body clothing?: Total Help from another person to put on and taking off regular lower body clothing?:  Total 6 Click Score: 6   End of Session Nurse Communication: Mobility status  Activity Tolerance: Patient tolerated treatment well Patient left: in bed;with bed alarm set;with call bell/phone within reach  OT Visit Diagnosis: Unsteadiness on feet (R26.81);Other abnormalities of gait and mobility (R26.89);Muscle weakness (generalized) (M62.81);Hemiplegia and hemiparesis                Time: 8119-1478 OT Time Calculation (min): 32 min Charges:  OT General Charges $OT Visit: 1 Visit OT Evaluation $OT Eval Moderate Complexity: 1 Mod  Veryl Gottron, OTR/L Acute Rehabilitation Services Office [phone] Secure Chat Communication Preferred    Starla Easterly 02/20/2024, 2:04 PM

## 2024-02-20 NOTE — Progress Notes (Signed)
 Subjective: The patient is much more alert today.  She is in no apparent distress.  He is mildly somnolent.  Objective: Vital signs in last 24 hours: Temp:  [99.3 F (37.4 C)-102.9 F (39.4 C)] 102.1 F (38.9 C) (06/18 0800) Pulse Rate:  [109-118] 109 (06/18 0900) Resp:  [22-33] 28 (06/18 1000) BP: (94-154)/(58-77) 154/69 (06/18 1000) SpO2:  [93 %-100 %] 93 % (06/18 0900) Weight:  [53.9 kg] 53.9 kg (06/18 0455) Estimated body mass index is 18.34 kg/m as calculated from the following:   Height as of this encounter: 5' 7.5 (1.715 m).   Weight as of this encounter: 53.9 kg.   Intake/Output from previous day: 06/17 0701 - 06/18 0700 In: 1353 [I.V.:3; NG/GT:1150; IV Piggyback:200] Out: 129 [Drains:129] Intake/Output this shift: Total I/O In: 200 [NG/GT:200] Out: 24 [Drains:24]  Physical exam the patient is mildly somnolent but easily arousable.  He is asking appropriate questions that she has what happened to me.  He is moving all 4 extremities.  Her ventriculostomy is draining.  Lab Results: Recent Labs    02/19/24 1639  HGB 11.2*  HCT 33.0*   BMET Recent Labs    02/19/24 1615 02/19/24 1639 02/20/24 0455  NA 134* 132* 135  K 4.3 4.4 4.5  CL 92*  --  92*  CO2 22  --  18*  GLUCOSE 177*  --  162*  BUN 51*  --  72*  CREATININE 3.79*  --  5.49*  CALCIUM  9.8  --  10.7*    Studies/Results: CT HEAD WO CONTRAST ( ) Result Date: 02/19/2024 CLINICAL DATA:  Altered mental status and history of brain bleed. EXAM: CT HEAD WITHOUT CONTRAST TECHNIQUE: Contiguous axial images were obtained from the base of the skull through the vertex without intravenous contrast. RADIATION DOSE REDUCTION: This exam was performed according to the departmental dose-optimization program which includes automated exposure control, adjustment of the mA and/or kV according to patient size and/or use of iterative reconstruction technique. COMPARISON:  CT of the head dated February 14, 2024. FINDINGS:  Brain: There has been significant interval improvement of subarachnoid hemorrhage seen primarily within the prepontine cistern and left sylvian fissure. There is minimal residual subarachnoid hemorrhage within the right sylvian fissure. There has been interval improvement of interventricular hemorrhage. There is also been significant interval improvement of hydrocephalus, although there is continued mild ballooning of the temporal horns. A right frontal approach ventriculostomy catheter is again demonstrated with its tip abutting the septum pellucidum in the midbody of the right lateral ventricle. There is no apparent intraparenchymal hemorrhage. There is diminished density present in the left paracentral pons. There is mild periventricular white matter disease. Vascular: Calcific atheromatous disease within the carotid siphons and vertebral arteries bilaterally. Skull: Right frontal approach ventriculostomy. The calvaria is otherwise intact. Sinuses/Orbits: Status post bilateral cataract surgery. The visualized paranasal sinuses are clear. Other: None. IMPRESSION: 1. Significant interval improvement of subarachnoid and intraventricular hemorrhage. 2. Interval improvement of hydrocephalus status post placement of right frontal approach ventriculostomy catheter. Electronically Signed   By: Maribeth Shivers M.D.   On: 02/19/2024 13:48   VAS US  TRANSCRANIAL DOPPLER Result Date: 02/19/2024  Transcranial Doppler Patient Name:  ZENDAYAH HARDGRAVE  Date of Exam:   02/18/2024 Medical Rec #: [id number]           Accession #:    [id number] Date of Birth: [birth year]            Patient Gender: F Patient Age:   43 years Exam  Location:  [facility] Procedure:      VAS US  TRANSCRANIAL DOPPLER Referring Phys: Fraser Jackson XU --------------------------------------------------------------------------------  Indications: Large ICH with brain compression and scattered SAH. History: ESRD, on dialysis. History of failed renal  transplant. History of pancreatic transplant. Presented to ED with hypertensive emergency, systolic BP in the 240s. Limitations: Positioning. Limitations for diagnostic windows: Unable to insonate right transtemporal window. Performing Technologist: Carleene Chase RVS  Examination Guidelines: A complete evaluation includes B-mode imaging, spectral Doppler, color Doppler, and power Doppler as needed of all accessible portions of each vessel. Bilateral testing is considered an integral part of a complete examination. Limited examinations for reoccurring indications may be performed as noted.  +----------+---------------+----------+-----------+------------------+ RIGHT TCD Right VM (cm/s)Depth (cm)Pulsatility     Comment       +----------+---------------+----------+-----------+------------------+ MCA                                           unable to insonate +----------+---------------+----------+-----------+------------------+ ACA                                           unable to insonate +----------+---------------+----------+-----------+------------------+ Term ICA                                      unable to insonate +----------+---------------+----------+-----------+------------------+ PCA P1                                        unable to insonate +----------+---------------+----------+-----------+------------------+ Opthalmic       25                    2.01                       +----------+---------------+----------+-----------+------------------+ ICA siphon                                    unable to insonate +----------+---------------+----------+-----------+------------------+ Vertebral       -28                   1.81                       +----------+---------------+----------+-----------+------------------+  +----------+--------------+----------+-----------+------------------+ LEFT TCD  Left VM (cm/s)Depth (cm)Pulsatility     Comment        +----------+--------------+----------+-----------+------------------+ MCA             13                    1.3                       +----------+--------------+----------+-----------+------------------+ ACA            -50                   1.25                       +----------+--------------+----------+-----------+------------------+ Term ICA  38                   1.27                       +----------+--------------+----------+-----------+------------------+ PCA P1          25                   1.05                       +----------+--------------+----------+-----------+------------------+ Opthalmic       21                   1.38                       +----------+--------------+----------+-----------+------------------+ ICA siphon                                   unable to insonate +----------+--------------+----------+-----------+------------------+ Vertebral      -18                   1.25                       +----------+--------------+----------+-----------+------------------+ Distal ICA     -27                   1.48                       +----------+--------------+----------+-----------+------------------+  +------------+-------+-------+             VM cm/sComment +------------+-------+-------+ Prox Basilar  -22          +------------+-------+-------+ +----------------------+---+ Right Lindegaard RatioN/A +----------------------+---+ +---------------------+----+ Left Lindegaard Ratio0.48 +---------------------+----+  Summary:  Absent right temporal and poor left temporal, orbital and suboccipital windows limit evaluation.No definite veidence of vasospasm noted in identified blood vessels. *See table(s) above for TCD measurements and observations.  Diagnosing physician: Ardella Beaver MD Electronically signed by Ardella Beaver MD on 02/19/2024 at 12:23:57 PM.    Final    EEG adult Result Date: 02/18/2024 Arleene Lack, MD     02/18/2024   7:04 PM Patient Name: Merryl Buckels MRN: [id number] Epilepsy Attending: Arleene Lack Referring Physician/Provider: Audrene Lease, NP Date: 02/18/2024 Duration: 24.30 mins Patient history: 42yo F with perimesencephalic SAH with IVH. EEG to evaluate for seizure. Level of alertness:  comatose AEDs during EEG study: LEV, Prednisone  Technical aspects: This EEG study was done with scalp electrodes positioned according to the 10-20 International system of electrode placement. Electrical activity was reviewed with band pass filter of 1-70Hz , sensitivity of 7 uV/mm, display speed of 65mm/sec with a 60Hz  notched filter applied as appropriate. EEG data were recorded continuously and digitally stored.  Video monitoring was available and reviewed as appropriate. Description: EEG showed continuous generalized polymorphic sharply contoured 3 to 6 Hz theta-delta slowing. Generalized periodic discharges with triphasic morphology were also noted at 1-2 Hz, with waxing and waning morphology. Hyperventilation and photic stimulation were not performed.   ABNORMALITY - Periodic discharges with triphasic morphology, generalized ( GPDs) - Continuous slow, generalized IMPRESSION: This study showed generalized periodic discharges with triphasic morphology which can be due to toxic-metabolic etiology. However, given the frequency of discharges and the waxing and waning morphology, this pattern is on the  ictal-interictal continuum. Consider long term eeg if concern for ictal activity persists. Additionally there was severe diffuse encephalopathy. No definite seizures  were seen throughout the recording. Priyanka Suzanne Erps    Assessment/Plan: Nontraumatic subarachnoid hemorrhage, intraventricular hemorrhage, hydrocephalus: The patient is improving neurologically.  We will continue her ventriculostomy for now.  I will contact Dr. Nat Badger regarding an arteriogram to rule out any sources of recurrent bleeding.  LOS: 6 days      [name] 02/20/2024, 10:37 AM     Patient ID: Read Camel, female   DOB: 02-Oct-1980, 43 y.o.   MRN: [id number]

## 2024-02-20 NOTE — Progress Notes (Signed)
 [facility] Progress Note   Subjective: Better today!  For HD Objective Vitals:   02/20/24 0900 02/20/24 1000 02/20/24 1100 02/20/24 1200  BP: 134/66 (!) 154/69 109/63 118/60  Pulse: (!) 109   (!) 104  Resp: (!) 28 (!) 28 (!) 28 (!) 26  Temp:      TempSrc:      SpO2: 93%   100%  Weight:      Height:       Physical Exam General: ill appearing, eyes closed but talking Heart: RRR Lungs: clear on [place] Abdomen:soft Extremities: minimal edema Dialysis Access: temp femoral HD catheter, RUE AV access +t/b  Additional Objective Labs: Basic Metabolic Panel: Recent Labs  Lab 02/19/24 0854 02/19/24 1615 02/19/24 1639 02/20/24 0455  NA 136 134* 132* 135  K 3.5 4.3 4.4 4.5  CL 92* 92*  --  92*  CO2 24 22  --  18*  GLUCOSE 158* 177*  --  162*  BUN 36* 51*  --  72*  CREATININE 2.94* 3.79*  --  5.49*  CALCIUM  9.8 9.8  --  10.7*  PHOS 4.3 5.5*  --  7.4*   Liver Function Tests: Recent Labs  Lab 02/13/24 2109 02/14/24 0949 02/19/24 0854 02/19/24 1615 02/20/24 0455  AST 16  --   --   --   --   ALT 11  --   --   --   --   ALKPHOS 92  --   --   --   --   BILITOT 0.8  --   --   --   --   PROT 7.6  --   --   --   --   ALBUMIN 3.9   < > 3.3* 3.2* 3.1*   < > = values in this interval not displayed.   Recent Labs  Lab 02/13/24 2109  LIPASE 35   CBC: Recent Labs  Lab 02/14/24 1741 02/15/24 0430 02/16/24 0513 02/17/24 0928 02/19/24 1639 02/20/24 1125  WBC 9.8 7.3 7.4 9.0  --  9.4  NEUTROABS  --   --   --   --   --  7.3  HGB 10.5* 9.6* 10.2* 11.4* 11.2* 11.3*  HCT 32.9* 30.8* 32.2* 34.6* 33.0* 34.6*  MCV 93.5 97.2 95.3 94.0  --  93.0  PLT 116*  109* 119* 136* 156  --  180   Blood Culture    Component Value Date/Time   SDES BLOOD RIGHT HAND 02/14/2024 0949   SPECREQUEST  02/14/2024 0949    AEROBIC BOTTLE ONLY Blood Culture results may not be optimal due to an inadequate volume of blood received in culture bottles   CULT  02/14/2024 0949    NO  GROWTH 5 DAYS Performed at [facility], [address], [place], [name] [address]    REPTSTATUS 02/19/2024 FINAL 02/14/2024 0949    Cardiac Enzymes: No results for input(s): CKTOTAL, CKMB, CKMBINDEX, TROPONINI in the last 168 hours. CBG: Recent Labs  Lab 02/19/24 1936 02/19/24 2335 02/20/24 0340 02/20/24 0733 02/20/24 1124  GLUCAP 133* 141* 149* 128* 182*   Iron  Studies: No results for input(s): IRON , TIBC, TRANSFERRIN, FERRITIN in the last 72 hours. @lablastinr3 @ Studies/Results: CT HEAD WO CONTRAST ( ) Result Date: 02/19/2024 CLINICAL DATA:  Altered mental status and history of brain bleed. EXAM: CT HEAD WITHOUT CONTRAST TECHNIQUE: Contiguous axial images were obtained from the base of the skull through the vertex without intravenous contrast. RADIATION DOSE REDUCTION: This exam was performed  according to the departmental dose-optimization program which includes automated exposure control, adjustment of the mA and/or kV according to patient size and/or use of iterative reconstruction technique. COMPARISON:  CT of the head dated February 14, 2024. FINDINGS: Brain: There has been significant interval improvement of subarachnoid hemorrhage seen primarily within the prepontine cistern and left sylvian fissure. There is minimal residual subarachnoid hemorrhage within the right sylvian fissure. There has been interval improvement of interventricular hemorrhage. There is also been significant interval improvement of hydrocephalus, although there is continued mild ballooning of the temporal horns. A right frontal approach ventriculostomy catheter is again demonstrated with its tip abutting the septum pellucidum in the midbody of the right lateral ventricle. There is no apparent intraparenchymal hemorrhage. There is diminished density present in the left paracentral pons. There is mild periventricular white matter disease. Vascular: Calcific atheromatous disease within the  carotid siphons and vertebral arteries bilaterally. Skull: Right frontal approach ventriculostomy. The calvaria is otherwise intact. Sinuses/Orbits: Status post bilateral cataract surgery. The visualized paranasal sinuses are clear. Other: None. IMPRESSION: 1. Significant interval improvement of subarachnoid and intraventricular hemorrhage. 2. Interval improvement of hydrocephalus status post placement of right frontal approach ventriculostomy catheter. Electronically Signed   By: Maribeth Shivers M.D.   On: 02/19/2024 13:48   VAS US  TRANSCRANIAL DOPPLER Result Date: 02/19/2024  Transcranial Doppler Patient Name:  [name] [name]  Date of Exam:   02/18/2024 Medical Rec #: [id number]           Accession #:    [id number] Date of Birth: Jan 22, 1981            Patient Gender: F Patient Age:   43 years Exam Location:  [facility] Procedure:      VAS US  TRANSCRANIAL DOPPLER Referring Phys: Fraser Jackson XU --------------------------------------------------------------------------------  Indications: Large ICH with brain compression and scattered SAH. 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 Presented to ED with hypertensive emergency, systolic BP in the 240s. Limitations: Positioning. Limitations for diagnostic windows: Unable to insonate right transtemporal window. Performing Technologist: Carleene Chase RVS  Examination Guidelines: A complete evaluation includes B-mode imaging, spectral Doppler, color Doppler, and power Doppler as needed of all accessible portions of each vessel. Bilateral testing is considered an integral part of a complete examination. Limited examinations for reoccurring indications may be performed as noted.  +----------+---------------+----------+-----------+------------------+ RIGHT TCD Right VM (cm/s)Depth (cm)Pulsatility     Comment       +----------+---------------+----------+-----------+------------------+ MCA                                            unable to insonate +----------+---------------+----------+-----------+------------------+ ACA                                           unable to insonate +----------+---------------+----------+-----------+------------------+ Term ICA                                      unable to insonate +----------+---------------+----------+-----------+------------------+ PCA P1  unable to insonate +----------+---------------+----------+-----------+------------------+ Opthalmic       25                    2.01                       +----------+---------------+----------+-----------+------------------+ ICA siphon                                    unable to insonate +----------+---------------+----------+-----------+------------------+ Vertebral       -28                   1.81                       +----------+---------------+----------+-----------+------------------+  +----------+--------------+----------+-----------+------------------+ LEFT TCD  Left VM (cm/s)Depth (cm)Pulsatility     Comment       +----------+--------------+----------+-----------+------------------+ MCA             13                    1.3                       +----------+--------------+----------+-----------+------------------+ ACA            -50                   1.25                       +----------+--------------+----------+-----------+------------------+ Term ICA        38                   1.27                       +----------+--------------+----------+-----------+------------------+ PCA P1          25                   1.05                       +----------+--------------+----------+-----------+------------------+ Opthalmic       21                   1.38                       +----------+--------------+----------+-----------+------------------+ ICA siphon                                   unable to  insonate +----------+--------------+----------+-----------+------------------+ Vertebral      -18                   1.25                       +----------+--------------+----------+-----------+------------------+ Distal ICA     -27                   1.48                       +----------+--------------+----------+-----------+------------------+  +------------+-------+-------+             VM cm/sComment +------------+-------+-------+ Prox Basilar  -22          +------------+-------+-------+ +----------------------+---+  Right Lindegaard RatioN/A +----------------------+---+ +---------------------+----+ Left Lindegaard Ratio0.48 +---------------------+----+  Summary:  Absent right temporal and poor left temporal, orbital and suboccipital windows limit evaluation.No definite veidence of vasospasm noted in identified blood vessels. *See table(s) above for TCD measurements and observations.  Diagnosing physician: Ardella Beaver MD Electronically signed by Ardella Beaver MD on 02/19/2024 at 12:23:57 PM.    Final    EEG adult Result Date: 02/18/2024 Arleene Lack, MD     02/18/2024  7:04 PM Patient Name: [name] [name] MRN: [id number] Epilepsy Attending: Arleene Lack Referring Physician/Provider: Audrene Lease, NP Date: 02/18/2024 Duration: 24.30 mins Patient history: 43yo F with perimesencephalic SAH with IVH. EEG to evaluate for seizure. Level of alertness:  comatose AEDs during EEG study: LEV, Prednisone  Technical aspects: This EEG study was done with scalp electrodes positioned according to the 10-20 International system of electrode placement. Electrical activity was reviewed with band pass filter of 1-70Hz , sensitivity of 7 uV/mm, display speed of 39mm/sec with a 60Hz  notched filter applied as appropriate. EEG data were recorded continuously and digitally stored.  Video monitoring was available and reviewed as appropriate. Description: EEG showed continuous generalized polymorphic  sharply contoured 3 to 6 Hz theta-delta slowing. Generalized periodic discharges with triphasic morphology were also noted at 1-2 Hz, with waxing and waning morphology. Hyperventilation and photic stimulation were not performed.   ABNORMALITY - Periodic discharges with triphasic morphology, generalized ( GPDs) - Continuous slow, generalized IMPRESSION: This study showed generalized periodic discharges with triphasic morphology which can be due to toxic-metabolic etiology. However, given the frequency of discharges and the waxing and waning morphology, this pattern is on the ictal-interictal continuum. Consider long term eeg if concern for ictal activity persists. Additionally there was severe diffuse encephalopathy. No definite seizures  were seen throughout the recording. Priyanka O Yadav    Medications:  feeding supplement (OSMOLITE 1.5 CAL) 50 mL/hr at 02/20/24 1000   piperacillin -tazobactam (ZOSYN )  IV Stopped (02/20/24 0531)    amLODipine   10 mg Per Tube Daily   Chlorhexidine  Gluconate Cloth  6 each Topical Daily   cycloSPORINE   25 mg Per Tube Daily   doxercalciferol   4 mcg Intravenous Q M,W,F-HD   fiber supplement (BANATROL TF)  60 mL Per Tube BID   insulin  aspart  0-9 Units Subcutaneous Q4H   losartan   50 mg Per Tube Daily   mycophenolate   500 mg Per Tube BID   niMODipine   60 mg Per Tube Q4H   mouth rinse  15 mL Mouth Rinse 4 times per day   mouth rinse  15 mL Mouth Rinse 4 times per day   predniSONE   15 mg Per Tube Q breakfast   sodium bicarbonate   650 mg Per Tube TID   sodium chloride  flush  3 mL Intravenous Q12H   sodium chloride  flush  3-10 mL Intravenous Q12H    Dialysis Orders: [facility] MWF 3:45 hrs 180NRe 400/500 57.2 kg 2.0 K/ 2.0 Ca AVF - No heparin  - Hectorol  4 mcg IV three times per week - Mircera 150 mcg IV q 2 weeks (last dose 02/11/2024 next dose due 02/25/2024)  Assessment/Plan:  Altered mental status secondary to ICH complicated by hydrocephalus:  Has drain in now.  Management per neuro/CCM.  MRI showed improvement of hydrocephalus and limiting sedating meds.   Acute hypoxic respiratory failure- improved with UF.  Also on pip/tazo for aspiration.   ESRD - MWF HD typically, s/pCRRT since 6/12.  HD 6/16, next 6/18  Hypertension/volume  - Chronic  difficult to manage HTN. Bp goals per CCM currently. Cleviprex  now off, on enteral meds.   Anemia  - HGB stable 10s. Recent ESA. Follow labs.   Metabolic bone disease -  Chronic noncompliance with binders. Phos currently in 3s. Resume binder when needed. On VDRA.   Nutrition - Albumin OK. Enteral nutrition currently.   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 Continues on immunosuppressant [place] for pancreas.  Pharm provided liquid dosing equivalents with NGT.  Followed by [facility].  Sepsis: keeps spiking fevers- fem cath out.  Leandra Pro MD 02/20/2024, 12:30 PM  [facility]

## 2024-02-20 NOTE — Progress Notes (Signed)
   02/20/24 1715  Vitals  Temp (!) 101.3 F (38.5 C)  Pulse Rate (!) 117  Resp (!) 28  BP 128/64  SpO2 94 %  O2 Device Room Air  Oxygen  Therapy  Patient Activity (if Appropriate) In bed  Post Treatment  Dialyzer Clearance Lightly streaked  Hemodialysis Intake (mL) 0 mL  Liters Processed 55.4  Fluid Removed (mL) 900 mL  Tolerated HD Treatment No (Comment)  Post-Hemodialysis Comments Pt given PO pain medication and informed this nurse she was still in pain. Pt upset that she could not receive IV dilauded after being informed she had not given PO pain medication time to work. Pt requested to end dialysis treatment 53 minutes early AMA. MD-Upton notified before treatment terminated with dialysis length and fluid removed.  AVG/AVF Arterial Site Held (minutes) 5 minutes  AVG/AVF Venous Site Held (minutes) 5 minutes

## 2024-02-20 NOTE — Progress Notes (Signed)
   Inpatient Rehab Admissions Coordinator :  Per therapy recommendations patient was screened for CIR candidacy by Ottie Glazier RN MSN. Patient is not yet at a level to tolerate the intensity required to pursue a CIR admit . The CIR admissions team will follow and monitor for progress and place a Rehab Consult order if felt to be appropriate. Please contact me with any questions.  Ottie Glazier RN MSN Admissions Coordinator [phone]

## 2024-02-20 NOTE — TOC Progression Note (Signed)
 Transition of Care [facility]) - Progression Note    Patient Details  Name: Ashawna Hanback MRN: [id number] Date of Birth: 1980/12/29  Transition of Care [facility]) CM/SW Contact  Jannice Mends, LCSW Phone Number: 02/20/2024, 5:41 PM  Clinical Narrative:    TOC continuing to follow.         Expected Discharge Plan and Services                                               Social Determinants of Health (SDOH) Interventions SDOH Screenings   Food Insecurity: Patient Unable To Answer (02/15/2024)  Housing: Unknown (02/15/2024)  Transportation Needs: Patient Unable To Answer (02/15/2024)  Utilities: Patient Unable To Answer (02/15/2024)  Social Connections: Unknown (10/04/2022)   Received from [facility]  Tobacco Use: Medium Risk (02/13/2024)    Readmission Risk Interventions    11/17/2021   12:15 PM  Readmission Risk Prevention Plan  Transportation Screening Complete  Medication Review (RN Care Manager) Complete  PCP or Specialist appointment within 3-5 days of discharge Complete  HRI or Home Care Consult Complete  SW Recovery Care/Counseling Consult Complete  Palliative Care Screening Not Applicable  Skilled Nursing Facility Patient Refused

## 2024-02-20 NOTE — Progress Notes (Signed)
 Pt perhaps slightly improved this am, still not lucid enough to undergo diagnostic angiogram. Will plan on early next week after she has had a chance to improve further.  Augusto Blonder, MD [facility]

## 2024-02-20 NOTE — Evaluation (Signed)
 Clinical/Bedside Swallow Evaluation Patient Details  Name: [name] [name] MRN: [id number] Date of Birth: 13-May-1981  Today's Date: 02/20/2024 Time: SLP Start Time (ACUTE ONLY): 1535 SLP Stop Time (ACUTE ONLY): 1548 SLP Time Calculation (min) (ACUTE ONLY): 13 min  Past Medical History:  Past Medical History:  Diagnosis Date   Anemia    Anxiety    DM (diabetes mellitus), type 1 (HCC)    ESRD on hemodialysis (HCC)    Gastroparesis    Hypertension    Narcotic abuse (HCC)    Non compliance with medical treatment    Vision loss    Past Surgical History:  Past Surgical History:  Procedure Laterality Date   A/V FISTULAGRAM Right 02/17/2021   Procedure: A/V FISTULAGRAM;  Surgeon: Young Hensen, MD;  Location: [facility] INVASIVE CV LAB;  Service: Cardiovascular;  Laterality: Right;   AV FISTULA PLACEMENT  11/17/2011   Procedure: ARTERIOVENOUS (AV) FISTULA CREATION;  Surgeon: Mayo Speck, MD;  Location: [facility] OR;  Service: Vascular;  Laterality: Right;   BIOPSY  12/03/2021   Procedure: BIOPSY;  Surgeon: Albertina Hugger, MD;  Location: [facility] ENDOSCOPY;  Service: Gastroenterology;;   ESOPHAGOGASTRODUODENOSCOPY (EGD) WITH PROPOFOL  N/A 12/03/2021   Procedure: ESOPHAGOGASTRODUODENOSCOPY (EGD) WITH PROPOFOL ;  Surgeon: Albertina Hugger, MD;  Location: [facility] ENDOSCOPY;  Service: Gastroenterology;  Laterality: N/A;   EYE SURGERY     HEMATOMA EVACUATION Right 02/25/2021   Procedure: EVACUATION HEMATOMA RIGHT CHEST;  Surgeon: Adine Hoof, MD;  Location: [facility] OR;  Service: Vascular;  Laterality: Right;   INSERTION OF DIALYSIS CATHETER Right 12/08/2020   Procedure: INSERTION OF TUNNELED DIALYSIS CATHETER;  Surgeon: Young Hensen, MD;  Location: [facility] OR;  Service: Vascular;  Laterality: Right;   IR REMOVAL TUN CV CATH W/O FL  05/03/2021   KIDNEY TRANSPLANT     NEPHRECTOMY TRANSPLANTED ORGAN     pancrease transplant     PERIPHERAL VASCULAR BALLOON ANGIOPLASTY Right 02/17/2021   Procedure:  PERIPHERAL VASCULAR BALLOON ANGIOPLASTY;  Surgeon: Young Hensen, MD;  Location: [facility] INVASIVE CV LAB;  Service: Cardiovascular;  Laterality: Right;   REFRACTIVE SURGERY     REVISON OF ARTERIOVENOUS FISTULA Right 12/08/2020   Procedure: RIGHT ARM ARTERIOVENOUS FISTULA REVISION AND RESECTION;  Surgeon: Young Hensen, MD;  Location: [facility] OR;  Service: Vascular;  Laterality: Right;   REVISON OF ARTERIOVENOUS FISTULA Right 02/25/2021   Procedure: RIGHT ARM ARTERIOVENOUS FISTULA REVISION WITH TRANSPOSITION OF CEPHALIC VEIN ON AXILLARY VEIN;  Surgeon: Young Hensen, MD;  Location: [facility] OR;  Service: Vascular;  Laterality: Right;   HPI:  Patient is a 43 y.o. female with PMH: ESRD on HD, s/p renal and pancreatic transplant, DM-1 with gastroparesis, anxiety, narcotic abuse, vision loss, non-compliance with medical treatment. She presented to the hospital on 02/14/24 with nausea and vomiting which started after HD with patient developing severe abdominal pain after completing nearly all of her HD session. In ED, she had temp of 95.9 degrees F with tachypnea, elevated BP and SpO2 as low as 89% despite being on HFNC. She was admitted with possible sepsis secondary to colitis, acute respiratory failure with hypoxia, acute metabolic encephalopathy. 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 CRRT 6/13-15, EVD placed on 6/12. She was emergently intubated on 6/12 and extubated on 6/14. She has been NPO awaiting SLP swallow evaluation.    Assessment / Plan / Recommendation  Clinical Impression  Patient presents with clinical s/s of dysphagia as per this bedside swallow evaluation.  She exhibited poor overall awareness to boluses and delayed swallow initiation but no overt s/s aspiration. With ice chips, she started to chew but then seemed to lose focus and required a cue to redirect. Even prior to PO's, slightly wet voice heard with SLP suspecting patient is not fully managing her secretions.  SLP is recommending continue NPO status (has Cortrak for primary nutrition) but allow for floor stock thin liquids when she is awake and alert. SLP will follow for PO readiness. SLP Visit Diagnosis: Dysphagia, unspecified (R13.10)    Aspiration Risk  Mild aspiration risk    Diet Recommendation NPO;Other (Comment) (floor stock thin liquids)    Medication Administration: Via alternative means Supervision: Full supervision/cueing for compensatory strategies Postural Changes: Seated upright at 90 degrees    Other  Recommendations Oral Care Recommendations: Oral care BID;Oral care before and after PO     Assistance Recommended at Discharge Frequent or constant Supervision/Assistance  Functional Status Assessment Patient has had a recent decline in their functional status and demonstrates the ability to make significant improvements in function in a reasonable and predictable amount of time.  Frequency and Duration min 2x/week  2 weeks       Prognosis Prognosis for improved oropharyngeal function: Good Barriers to Reach Goals: Cognitive deficits      Swallow Study   General Date of Onset: 02/14/24 HPI: Patient is a 43 y.o. female with PMH: ESRD on HD, s/p renal and pancreatic transplant, DM-1 with gastroparesis, anxiety, narcotic abuse, vision loss, non-compliance with medical treatment. She presented to the hospital on 02/14/24 with nausea and vomiting which started after HD with patient developing severe abdominal pain after completing nearly all of her HD session. In ED, she had temp of 95.9 degrees F with tachypnea, elevated BP and SpO2 as low as 89% despite being on HFNC. She was admitted with possible sepsis secondary to colitis, acute respiratory failure with hypoxia, acute metabolic encephalopathy. 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 CRRT 6/13-15, EVD placed on 6/12. She was emergently intubated on 6/12 and extubated on 6/14. She has been NPO awaiting SLP  swallow evaluation. Type of Study: Bedside Swallow Evaluation Previous Swallow Assessment: none found Diet Prior to this Study: NPO Temperature Spikes Noted: No Respiratory Status: Room air History of Recent Intubation: Yes Total duration of intubation (days): 3 days Date extubated: 02/16/24 Behavior/Cognition: Alert;Cooperative;Pleasant mood;Confused;Distractible;Requires cueing Oral Cavity Assessment: Dry Oral Care Completed by SLP: Yes Oral Cavity - Dentition: Adequate natural dentition Self-Feeding Abilities: Needs assist;Needs set up Patient Positioning: Upright in bed Baseline Vocal Quality: Normal Volitional Cough: Cognitively unable to elicit Volitional Swallow: Unable to elicit    Oral/Motor/Sensory Function Overall Oral Motor/Sensory Function: Within functional limits   Ice Chips Ice chips: Impaired Oral Phase Impairments: Poor awareness of bolus Pharyngeal Phase Impairments: Suspected delayed Swallow   Thin Liquid Thin Liquid: Impaired Presentation: Spoon;Straw Oral Phase Impairments: Poor awareness of bolus Pharyngeal  Phase Impairments: Suspected delayed Swallow    Nectar Thick     Honey Thick     Puree Puree: Not tested   Solid     Solid: Not tested     Jacqualine Mater, MA, CCC-SLP Speech Therapy

## 2024-02-20 NOTE — Progress Notes (Signed)
 eLink Physician-Brief Progress Note Patient Name: [name] [name] DOB: [birth year] MRN: [id number]   Date of Service  02/20/2024  HPI/Events of Note  43 year old chronically ill woman ESRD on HD transferred to the ICU with encephalopathy found to have large ICH, ICH score 4 with associated subarachnoid hemorrhage.   Change in neurological status-now no longer following commands with upper and lower extremities.  Still protecting her airway and able to cough appropriately.  Moaning intermittently.  eICU Interventions  Neurosurgery to be notified by primary RN  Stat CT head for change in neurological status     Intervention Category Intermediate Interventions: Change in mental status - evaluation and management  Waneta Fitting 02/20/2024, 8:33 PM

## 2024-02-20 NOTE — Progress Notes (Signed)
 Transcranial Doppler  Date POD PCO2 HCT BP  MCA ACA PCA OPHT SIPH VERT Basilar  6/16 CK    130/59 Right  Left   *  13   *    -50 *    25 25    21  *    * -28    -18 -22      6/18 rs     Right  Left   130    110 -61    -53 54    26 19    19  *    * -42    -14 *           Right  Left                                             Right  Left                                             Right  Left                                            Right  Left                                            Right  Left                                       *Unable to insonate Lind[place]ard Ratio:  RIGHT - 3.9  -LEFT - 4.7 MCA = Middle Cerebral Artery      OPHT = Opthalmic Artery     BASILAR = Basilar Artery   ACA = Anterior Cerebral Artery     SIPH = Carotid Siphon PCA = Posterior Cerebral Artery   VERT = Verterbral Artery                   Normal MCA = 62+\-12 ACA = 50+\-12 PCA = 42+\-23  [name] [name], RDMS, RVT

## 2024-02-20 NOTE — Evaluation (Signed)
 Speech Language Pathology Evaluation Patient Details Name: [name] [name] MRN: [id number] DOB: 13-Nov-1980 Today's Date: 02/20/2024 Time: 4098-1191 SLP Time Calculation (min) (ACUTE ONLY): 15 min  Problem List:  Patient Active Problem List   Diagnosis Date Noted   Pontine hemorrhage (HCC) 02/15/2024   Malnutrition of moderate degree 02/15/2024   SAH (subarachnoid hemorrhage) (HCC) 02/15/2024   Colitis 02/14/2024   History of diabetic gastroparesis 02/14/2024   History of pancreatic islet cell transplantation 02/14/2024   Acute metabolic encephalopathy 02/14/2024   Prolonged QT interval 02/14/2024   Controlled type 2 diabetes mellitus with complication, without long-term current use of insulin  (HCC) 02/14/2024   Sepsis (HCC) 02/14/2024   Chronic, continuous use of opioids 11/12/2023   Need for acute hemodialysis due to missed session(HCC) 11/12/2023   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/12/2023   Vaginal bleeding 06/13/2023   ESRD needing dialysis (HCC) 03/07/2022   Chronic pain 03/07/2022   ESRD (end stage renal disease) (HCC)    Hypertensive emergency 01/31/2022   Hypertensive crisis 01/30/2022   Hypervolemia associated with renal insufficiency 12/23/2021   Hemorrhagic gastritis    Subdural hematoma (HCC) 12/02/2021   Acute blood loss anemia 12/02/2021   Chronic abdominal pain 12/02/2021   Thrombocytopenia (HCC) 12/02/2021   Abnormal CT of the abdomen 12/02/2021   Type 1 diabetes (HCC) 12/02/2021   Fever 11/07/2021   Acute encephalopathy 11/06/2021   Pulmonary edema 11/06/2021   Hypertensive urgency 11/06/2021   Type 2 diabetes mellitus with peripheral neuropathy (HCC) 11/06/2021   History of renal transplant 11/06/2021   COVID-19 virus infection 11/06/2021   HTN (hypertension), malignant 08/02/2021   ESRD on dialysis (HCC) 02/25/2021   Coagulation defect, unspecified (HCC) 12/22/2020   Acute cough 10/04/2020   Allergy, unspecified,  initial encounter 05/21/2020   Anaphylactic shock, unspecified, initial encounter 05/21/2020   Pancytopenia (HCC) 02/27/2020   Shortness of breath 04/23/2019   Hypocalcemia 12/30/2018   Aortic atherosclerosis (HCC) 12/27/2018   Acute respiratory failure with hypoxia (HCC) 12/22/2018   Hyperkalemia    Bilateral pneumonia 12/21/2018   Pancreas transplant status (HCC) 12/21/2018   Immunosuppression (HCC) 12/21/2018   Chest pain 12/21/2018   CAP (community acquired pneumonia) 12/20/2018   Other staphylococcus as the cause of diseases classified elsewhere 08/16/2018   Mild protein-calorie malnutrition (HCC) 04/18/2018   Encounter for screening for respiratory tuberculosis 04/06/2018   Gout 04/06/2018   Iron  deficiency anemia, unspecified 04/06/2018   Leiomyoma of uterus, unspecified 04/06/2018   Secondary hyperparathyroidism of renal origin (HCC) 04/06/2018   Acute rejection of kidney transplant 07/11/2017   Elevated serum creatinine 06/27/2017   E. coli UTI 12/18/2014   Transplant rejection 09/03/2014   Cyst of ovary, left 09/01/2014   Immunosuppressive management encounter following kidney transplant 08/26/2014   Acne 04/28/2014   Itch of skin 04/28/2014   Diabetic gastroparesis associated with type 1 diabetes mellitus (HCC) 11/07/2013   Nausea and vomiting 11/07/2013   Lesion of spleen 10/07/2013   Metabolic acidosis 09/25/2013   Patient's noncompliance with other medical treatment and regimen 09/12/2013   Narcotic dependence (HCC) 08/31/2013   Generalized pain 08/30/2013   Hypomagnesemia 08/30/2013   Failed kidney transplant 08/21/2013   Hypophosphatemia 07/28/2013   End stage renal disease (HCC) 02/06/2012   Pleural effusion, bilateral 11/16/2011   Anemia in chronic kidney disease (CKD) 11/16/2011   Pericardial effusion 11/15/2011   Fluid overload 11/15/2011   Abdominal pain, epigastric 11/15/2011   CKD (chronic kidney disease) stage 5, GFR less than  15 ml/min (HCC)  11/03/2011   Hypertension 10/27/2011   Renal failure, chronic 10/27/2011   MEDIAL EPICONDYLITIS, LEFT 12/17/2007   ABSCESS, AXILLA, LEFT 12/03/2007   Type 2 diabetes mellitus with other diabetic kidney complication (HCC) 11/05/2007   Past Medical History:  Past Medical History:  Diagnosis Date   Anemia    Anxiety    DM (diabetes mellitus), type 1 (HCC)    ESRD on hemodialysis (HCC)    Gastroparesis    Hypertension    Narcotic abuse (HCC)    Non compliance with medical treatment    Vision loss    Past Surgical History:  Past Surgical History:  Procedure Laterality Date   A/V FISTULAGRAM Right 02/17/2021   Procedure: A/V FISTULAGRAM;  Surgeon: Young Hensen, MD;  Location: [facility] IN[facility];  Service: Cardiovascular;  Laterality: Right;   AV FISTULA PLACEMENT  11/17/2011   Procedure: ARTERIOVENOUS (AV) FISTULA CREATION;  Surgeon: Mayo Speck, MD;  Location: [facility] OR;  Service: Vascular;  Laterality: Right;   BIOPSY  12/03/2021   Procedure: BIOPSY;  Surgeon: Albertina Hugger, MD;  Location: [facility] ENDOSCOPY;  Service: Gastroenterology;;   ESOPHAGOGASTRODUODENOSCOPY (EGD) WITH PROPOFOL  N/A 12/03/2021   Procedure: ESOPHAGOGASTRODUODENOSCOPY (EGD) WITH PROPOFOL ;  Surgeon: Albertina Hugger, MD;  Location: [facility] ENDOSCOPY;  Service: Gastroenterology;  Laterality: N/A;   EYE SURGERY     HEMATOMA EVACUATION Right 02/25/2021   Procedure: EVACUATION HEMATOMA RIGHT CHEST;  Surgeon: Adine Hoof, MD;  Location: [facility] OR;  Service: Vascular;  Laterality: Right;   INSERTION OF DIALYSIS CATHETER Right 12/08/2020   Procedure: INSERTION OF TUNNELED DIALYSIS CATHETER;  Surgeon: Young Hensen, MD;  Location: [facility] OR;  Service: Vascular;  Laterality: Right;   IR REMOVAL TUN CV CATH W/O FL  05/03/2021   KIDNEY TRANSPLANT     NEPHRECTOMY TRANSPLANTED ORGAN     pancrease transplant     PERIPHERAL VASCULAR BALLOON ANGIOPLASTY Right 02/17/2021   Procedure: PERIPHERAL VASCULAR BALLOON  ANGIOPLASTY;  Surgeon: Young Hensen, MD;  Location: [facility]AS[facility] CV [facility];  Service: Cardiovascular;  Laterality: Right;   REFRACTIVE SURGERY     REVISON OF ARTERIOVENOUS FISTULA Right 12/08/2020   Procedure: RIGHT ARM ARTERIOVENOUS FISTULA REVISION AND RESECTION;  Surgeon: Young Hensen, MD;  Location[facility] OR;  Service: Vascular;  Laterality: Right;   REVISON OF ARTERIOVENOUS FISTULA Right 02/25/2021   Procedure: RIGHT ARM ARTERIOVENOUS FISTULA REVISION WITH TRANSPOSITION OF CEPHALIC VEIN ON AXILLARY VEIN;  Surgeon: Young Hensen, MD;  Location: [facility] OR;  Service: Vascular;  Laterality: Right;   HPI:  Patient is a 43 y.o. female with PMH: ESRD on HD, s/p renal and pancreatic transplant, DM-1 with gastroparesis, anxiety, narcotic abuse, vision loss, non-compliance with medical treatment. She presented to the hospital on 02/14/24 with nausea and vomiting which started after HD with patient developing severe abdominal pain after completing nearly all of her HD session. In ED, she had temp of 95.9 degrees F with tachypnea, elevated BP and SpO2 as low as 89% despite being on HFNC. She was admitted with possible sepsis secondary to colitis, acute respiratory failure with hypoxia, acute metabolic encephalopathy. 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 CRRT 6/13-15, EVD placed on 6/12. She was emergently intubated on 6/12 and extubated on 6/14.   Assessment / Plan / Recommendation Clinical Impression  Patient presents with a cognitive-linguistic impairment as per this evaluation. She was oriented to place, basic situation, month and year but unaware how  long she had been at the hospital, did not demonstrate adequate awareness to her current level of impairment and medical needs. She appeared anxious and perseverating on feeling cold. Attention was impaired with patient requiring frequent cues to redirect. Affect was impaired with patient speaking in a monotone voice, making only  occasional eye contact with SLP and not able to maintain topic. She was not able to consistently follow one-step directions and her poor attention did appear to impact this. As compared to SLP evaluation completed in 2023, patient appears to have had some decline in her overall function, but was exhibiting impairments in attention, executive functioning and delayed recall as per 2023 evaluation. SLP will follow while admitted and recommending skilled SLP services at next venue of care as well.    SLP Assessment  SLP Recommendation/Assessment: Patient needs continued Speech Language Pathology Services SLP Visit Diagnosis: Cognitive communication deficit (R41.841)     Assistance Recommended at Discharge  Frequent or constant Supervision/Assistance  Functional Status Assessment Patient has had a recent decline in their functional status and demonstrates the ability to make significant improvements in function in a reasonable and predictable amount of time.  Frequency and Duration min 2x/week  2 weeks      SLP Evaluation Cognition  Overall Cognitive Status: Impaired/Different from baseline Arousal/Alertness: Awake/alert Orientation Level: Oriented to person;Oriented to place;Disoriented to time;Oriented to situation Year: 2025 Month: June Day of Week: Incorrect Attention: Sustained Sustained Attention: Impaired Sustained Attention Impairment: Verbal basic;Functional basic Awareness: Impaired Awareness Impairment: Emergent impairment Problem Solving: Impaired Problem Solving Impairment: Functional basic       Comprehension  Auditory Comprehension Overall Auditory Comprehension: Impaired Commands: Impaired One Step Basic Commands: 50-74% accurate Interfering Components: Attention;Anxiety EffectiveTechniques: Repetition;Extra processing time Visual Recognition/Discrimination Discrimination: Not tested Reading Comprehension Reading Status: Not tested    Expression  Expression Primary Mode of Expression: Verbal Verbal Expression Overall Verbal Expression: Impaired Pragmatics: Impairment Impairments: Abnormal affect;Topic maintenance;Eye contact Interfering Components: Attention;Premorbid deficit Effective Techniques: Open ended questions Non-Verbal Means of Communication: Not applicable   Oral / Motor  Oral Motor/Sensory Function Overall Oral Motor/Sensory Function: Within functional limits Motor Speech Overall Motor Speech: Appears within functional limits for tasks assessed           Jacqualine Mater, MA, CCC-SLP Speech Therapy

## 2024-02-20 NOTE — Progress Notes (Signed)
 STROKE TEAM PROGRESS NOTE    SIGNIFICANT HOSPITAL EVENTS 6/11- ED for nausea, vomiting after dialysis 6/12- intubated, EVD placed   INTERIM HISTORY/SUBJECTIVE Patient is more alert and interactive today..  Tmax remains 102.5.    DC yesterday femoral line.  Ventriculostomy continues to drain well.  Cerebral catheter angiogram is on hold pending medical improvement.  She remains on Zosyn  for colitis. No family available at the bedside.  Blood pressure adequately controlled.  OBJECTIVE  CBC    Component Value Date/Time   WBC 9.0 02/17/2024 0928   RBC 3.68 (L) 02/17/2024 0928   HGB 11.2 (L) 02/19/2024 1639   HCT 33.0 (L) 02/19/2024 1639   PLT 156 02/17/2024 0928   MCV 94.0 02/17/2024 0928   MCH 31.0 02/17/2024 0928   MCHC 32.9 02/17/2024 0928   RDW 13.6 02/17/2024 0928   LYMPHSABS 1.0 11/05/2023 0343   MONOABS 0.2 11/05/2023 0343   EOSABS 0.1 11/05/2023 0343   BASOSABS 0.0 11/05/2023 0343    BMET    Component Value Date/Time   NA 135 02/20/2024 0455   K 4.5 02/20/2024 0455   CL 92 (L) 02/20/2024 0455   CO2 18 (L) 02/20/2024 0455   GLUCOSE 162 (H) 02/20/2024 0455   BUN 72 (H) 02/20/2024 0455   CREATININE 5.49 (H) 02/20/2024 0455   CALCIUM  10.7 (H) 02/20/2024 0455   GFRNONAA 9 (L) 02/20/2024 0455    IMAGING past 24 hours CT HEAD WO CONTRAST ( ) Result Date: 02/19/2024 CLINICAL DATA:  Altered mental status and history of brain bleed. EXAM: CT HEAD WITHOUT CONTRAST TECHNIQUE: Contiguous axial images were obtained from the base of the skull through the vertex without intravenous contrast. RADIATION DOSE REDUCTION: This exam was performed according to the departmental dose-optimization program which includes automated exposure control, adjustment of the mA and/or kV according to patient size and/or use of iterative reconstruction technique. COMPARISON:  CT of the head dated February 14, 2024. FINDINGS: Brain: There has been significant interval improvement of subarachnoid  hemorrhage seen primarily within the prepontine cistern and left sylvian fissure. There is minimal residual subarachnoid hemorrhage within the right sylvian fissure. There has been interval improvement of interventricular hemorrhage. There is also been significant interval improvement of hydrocephalus, although there is continued mild ballooning of the temporal horns. A right frontal approach ventriculostomy catheter is again demonstrated with its tip abutting the septum pellucidum in the midbody of the right lateral ventricle. There is no apparent intraparenchymal hemorrhage. There is diminished density present in the left paracentral pons. There is mild periventricular white matter disease. Vascular: Calcific atheromatous disease within the carotid siphons and vertebral arteries bilaterally. Skull: Right frontal approach ventriculostomy. The calvaria is otherwise intact. Sinuses/Orbits: Status post bilateral cataract surgery. The visualized paranasal sinuses are clear. Other: None. IMPRESSION: 1. Significant interval improvement of subarachnoid and intraventricular hemorrhage. 2. Interval improvement of hydrocephalus status post placement of right frontal approach ventriculostomy catheter. Electronically Signed   By: Maribeth Shivers M.D.   On: 02/19/2024 13:48     Vitals:   02/20/24 0800 02/20/24 0900 02/20/24 1000 02/20/24 1100  BP: 117/61 134/66 (!) 154/69 109/63  Pulse:  (!) 109    Resp: (!) 30 (!) 28 (!) 28 (!) 28  Temp: (!) 102.1 F (38.9 C)     TempSrc: Axillary     SpO2: 97% 93%    Weight:      Height:         PHYSICAL EXAM General: Lethargic sedated but will open eyes to  voice CV: Regular rate and rhythm on monitor Respiratory: Mechanically ventilated  GI: Abdomen soft and nontender   NEURO:  Mental status:on sedation, eyes closed, but will open eyes and follow simple commands with sedation turned off. Dysconjugate pupils which are small and sluggishly reactive gag and cough  present.  Tongue protrusion not cooperative.  With deep sternal rub able to lift against gravity RUE and RLE. Purposeful movement of the RUE  LUE drift and unable to lift LLE of the bed.  Sensation, coordination and gait not tested.   ASSESSMENT/PLAN  [name] [name] is a 43 y.o. female with history of hypertension, ESRD on HD, s/p renal and pancreatic transplant, diabetes mellitus type 1 with gastroparesis presents with nausea and vomiting since after dialysis.  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 Intraventricular blood products layering in the occipital horns.  CTA head & neck - No large vessel occlusion. No high-grade stenosis, aneurysm, or dissection of the arteries in the head and neck. Mild stenosis of the V4 segments of both vertebral arteries. Mild stenosis of the right supraclinoid ICA.  6/12 repeat CT Head- Interval placement of a right frontal approach ventriculostomy with tip in the body of the right lateral ventricle. Persistent but relatively stable hydrocephalus at this time. Moderate volume acute subarachnoid hemorrhage with associated IVH, similar to prior. No evidence for significant interval bleeding since prior. MRI completed, pending radiology report 2D Echo EF 60-65%, LA dilated  Recommend cerebral angiogram once stable to rule out prepontine vascular anomaly LDL 63 HgbA1c 4.2 VTE prophylaxis - SCDs No antithrombotic prior to admission, now on No antithrombotic  Therapy recommendations:  Pending Disposition:  Pending   Cerebral edema with brain compression Hydrocephalous s/p EVD CT head Increased caliber of the ventricles concerning for developing communicating hydrocephalus.  NSGY consulted EVD placement 6/12 On Keppra   ? Vasospasm  CTA head and neck Mild irregularity of the basilar  artery without significant narrowing. Finding concerning for mild vasospasm. TCD monitoring pending On nimodipine    Respiratory failure  Aspiration pneumonia PCCM primary Intubated 6/12 Sedated with propofol , fentanyl  On Zosyn   Hypertensive Emergency  Home meds:  Norvasc , coreg , clonidine  patch, hydralazine   Unstable Cleviprex , labetalol  PRN BP goal < 160 Long term BP goal normotensive  Diabetes Type 1 Hx of pancreas transplant  HgbA1c 4.2, goal < 7.0 CBGs, SSI  ESRD on HD Hx of renal transplant  HD patient with fistula Nephrology on board Now on CRRT   Dysphagia NG tube in place Speech therapy consult Nutrition consult for tube feeds  Other stroke risk factors   Other medical issues DM gastroparesis  Colitis  Thrombocytopenia - platelet 109  Hospital day # 6    02/20/2024 11:49 AM      Patient yet remains febrile but is on antibiotics for colitis.  Mental status is improved today.  Diagnostic cerebral catheter angiogram per neurosurgery hopefully in the next few days.  Will try Ritalin to provoke wakefulness.  No family available at the bedside.  Continue strict blood pressure control systolic goal below 160.  Ventriculostomy drainage as per neurosurgery.  Discussed with Dr. Marygrace Snellen critical care medicine    This patient is critically ill and at significant risk of neurological worsening, death and care requires constant monitoring of vital signs, hemodynamics,respiratory and cardiac monitoring, extensive review of multiple databases, frequent neurological  assessment, discussion with family, other specialists and medical decision making of high complexity.I have made any additions or clarifications directly to the above note.This critical care time does not reflect procedure time, or teaching time or supervisory time of PA/NP/Med Resident etc but could involve care discussion time.  I spent 30 minutes of neurocritical care time  in the care of  this patient.           Ardella Beaver, MD Medical Director [facility] Pager: [phone] 02/20/2024 11:49 AM     To contact Stroke Continuity provider, please refer to [email or web address]. After hours, contact General Neurology

## 2024-02-21 ENCOUNTER — Inpatient Hospital Stay (HOSPITAL_COMMUNITY)

## 2024-02-21 DIAGNOSIS — G936 Cerebral edema: Secondary | ICD-10-CM | POA: Diagnosis not present

## 2024-02-21 DIAGNOSIS — I615 Nontraumatic intracerebral hemorrhage, intraventricular: Secondary | ICD-10-CM | POA: Diagnosis not present

## 2024-02-21 DIAGNOSIS — G935 Compression of brain: Secondary | ICD-10-CM | POA: Diagnosis not present

## 2024-02-21 DIAGNOSIS — E1051 Type 1 diabetes mellitus with diabetic peripheral angiopathy without gangrene: Secondary | ICD-10-CM

## 2024-02-21 DIAGNOSIS — J810 Acute pulmonary edema: Secondary | ICD-10-CM | POA: Diagnosis not present

## 2024-02-21 DIAGNOSIS — I6389 Other cerebral infarction: Secondary | ICD-10-CM

## 2024-02-21 DIAGNOSIS — I609 Nontraumatic subarachnoid hemorrhage, unspecified: Secondary | ICD-10-CM | POA: Diagnosis not present

## 2024-02-21 DIAGNOSIS — I608 Other nontraumatic subarachnoid hemorrhage: Secondary | ICD-10-CM | POA: Diagnosis not present

## 2024-02-21 DIAGNOSIS — J9601 Acute respiratory failure with hypoxia: Secondary | ICD-10-CM | POA: Diagnosis not present

## 2024-02-21 LAB — RENAL FUNCTION PANEL
Albumin: 3.2 g/dL — ABNORMAL LOW (ref 3.5–5.0)
Albumin: 3.3 g/dL — ABNORMAL LOW (ref 3.5–5.0)
Anion gap: 26 — ABNORMAL HIGH (ref 5–15)
Anion gap: 27 — ABNORMAL HIGH (ref 5–15)
BUN: 60 mg/dL — ABNORMAL HIGH (ref 6–20)
BUN: 84 mg/dL — ABNORMAL HIGH (ref 6–20)
CO2: 22 mmol/L (ref 22–32)
CO2: 20 mmol/L — ABNORMAL LOW (ref 22–32)
Calcium: 10.8 mg/dL — ABNORMAL HIGH (ref 8.9–10.3)
Calcium: 10.9 mg/dL — ABNORMAL HIGH (ref 8.9–10.3)
Chloride: 87 mmol/L — ABNORMAL LOW (ref 98–111)
Chloride: 85 mmol/L — ABNORMAL LOW (ref 98–111)
Creatinine, Ser: 4.61 mg/dL — ABNORMAL HIGH (ref 0.44–1.00)
Creatinine, Ser: 5.85 mg/dL — ABNORMAL HIGH (ref 0.44–1.00)
GFR, Estimated: 12 mL/min — ABNORMAL LOW (ref >60)
GFR, Estimated: 9 mL/min — ABNORMAL LOW (ref >60)
Glucose, Bld: 142 mg/dL — ABNORMAL HIGH (ref 70–99)
Glucose, Bld: 180 mg/dL — ABNORMAL HIGH (ref 70–99)
Phosphorus: 7.2 mg/dL — ABNORMAL HIGH (ref 2.5–4.6)
Phosphorus: 8.7 mg/dL — ABNORMAL HIGH (ref 2.5–4.6)
Potassium: 5.1 mmol/L (ref 3.5–5.1)
Potassium: 5.4 mmol/L — ABNORMAL HIGH (ref 3.5–5.1)
Sodium: 135 mmol/L (ref 135–145)
Sodium: 132 mmol/L — ABNORMAL LOW (ref 135–145)

## 2024-02-21 LAB — CBC
HCT: 31.3 % — ABNORMAL LOW (ref 36.0–46.0)
Hemoglobin: 10.3 g/dL — ABNORMAL LOW (ref 12.0–15.0)
MCH: 31.1 pg (ref 26.0–34.0)
MCHC: 32.9 g/dL (ref 30.0–36.0)
MCV: 94.6 fL (ref 80.0–100.0)
Platelets: 162 10*3/uL (ref 150–400)
RBC: 3.31 MIL/uL — ABNORMAL LOW (ref 3.87–5.11)
RDW: 15.6 % — ABNORMAL HIGH (ref 11.5–15.5)
WBC: 8.8 10*3/uL (ref 4.0–10.5)
nRBC: 0 % (ref 0.0–0.2)

## 2024-02-21 LAB — GLUCOSE, CAPILLARY
Glucose-Capillary: 123 mg/dL — ABNORMAL HIGH (ref 70–99)
Glucose-Capillary: 147 mg/dL — ABNORMAL HIGH (ref 70–99)
Glucose-Capillary: 157 mg/dL — ABNORMAL HIGH (ref 70–99)
Glucose-Capillary: 165 mg/dL — ABNORMAL HIGH (ref 70–99)
Glucose-Capillary: 167 mg/dL — ABNORMAL HIGH (ref 70–99)
Glucose-Capillary: 197 mg/dL — ABNORMAL HIGH (ref 70–99)

## 2024-02-21 LAB — PROCALCITONIN: Procalcitonin: 1.69 ng/mL

## 2024-02-21 LAB — TRIGLYCERIDES: Triglycerides: 303 mg/dL — ABNORMAL HIGH (ref <150)

## 2024-02-21 LAB — SEDIMENTATION RATE: Sed Rate: 49 mm/h — ABNORMAL HIGH (ref 0–22)

## 2024-02-21 LAB — HIV ANTIBODY (ROUTINE TESTING W REFLEX): HIV Screen 4th Generation wRfx: NONREACTIVE

## 2024-02-21 LAB — VITAMIN B12: Vitamin B-12: 341 pg/mL (ref 180–914)

## 2024-02-21 LAB — MAGNESIUM: Magnesium: 2.9 mg/dL — ABNORMAL HIGH (ref 1.7–2.4)

## 2024-02-21 LAB — LACTIC ACID, PLASMA: Lactic Acid, Venous: 1.2 mmol/L (ref 0.5–1.9)

## 2024-02-21 LAB — AMMONIA: Ammonia: 17 umol/L (ref 9–35)

## 2024-02-21 LAB — FOLATE: Folate: 16.8 ng/mL (ref >5.9)

## 2024-02-21 LAB — TSH: TSH: 2.271 u[IU]/mL (ref 0.350–4.500)

## 2024-02-21 LAB — RPR: RPR Ser Ql: NONREACTIVE

## 2024-02-21 MED ORDER — LOPERAMIDE HCL 2 MG PO CAPS
2.0000 mg | ORAL_CAPSULE | ORAL | Status: AC | PRN
  Administered 2024-02-24: 2 mg via ORAL
  Administered 2024-02-25 – 2024-02-26 (×3): 4 mg via ORAL
  Filled 2024-02-21 (×2): qty 2
  Filled 2024-02-21: qty 1
  Filled 2024-02-21 (×2): qty 2

## 2024-02-21 MED ORDER — ORAL CARE MOUTH RINSE
15.0000 mL | OROMUCOSAL | Status: DC
  Administered 2024-02-21 – 2024-03-08 (×67): 15 mL via OROMUCOSAL

## 2024-02-21 MED ORDER — ACETAMINOPHEN 325 MG PO TABS
650.0000 mg | ORAL_TABLET | Freq: Four times a day (QID) | ORAL | Status: DC
  Administered 2024-02-21 – 2024-02-22 (×3): 650 mg
  Filled 2024-02-21 (×3): qty 2

## 2024-02-21 MED ORDER — ACETAMINOPHEN 650 MG RE SUPP: 650.0000 mg | Freq: Four times a day (QID) | RECTAL | Status: DC

## 2024-02-21 MED ORDER — OXYCODONE HCL 5 MG PO TABS: 2.5000 mg | ORAL_TABLET | Freq: Four times a day (QID) | ORAL | Status: DC | PRN

## 2024-02-21 MED ORDER — CLONIDINE HCL 0.1 MG PO TABS: 0.1000 mg | ORAL_TABLET | Freq: Every day | ORAL | Status: DC

## 2024-02-21 MED ORDER — OXYCODONE HCL 5 MG PO TABS: 2.5000 mg | ORAL_TABLET | Freq: Three times a day (TID) | ORAL | Status: DC | PRN

## 2024-02-21 MED ORDER — NOREPINEPHRINE 4 MG/250ML-% IV SOLN
0.0000 ug/min | INTRAVENOUS | Status: DC
  Administered 2024-02-21 – 2024-02-22 (×2): 2 ug/min via INTRAVENOUS
  Filled 2024-02-21 (×2): qty 250

## 2024-02-21 MED ORDER — CLONIDINE HCL 0.1 MG PO TABS: 0.1000 mg | ORAL_TABLET | Freq: Four times a day (QID) | ORAL | Status: DC

## 2024-02-21 MED ORDER — OSMOLITE 1.5 CAL PO LIQD
1000.0000 mL | ORAL | Status: DC
  Administered 2024-02-21 – 2024-02-23 (×3): 1000 mL

## 2024-02-21 MED ORDER — CLONIDINE HCL 0.1 MG PO TABS: 0.1000 mg | ORAL_TABLET | ORAL | Status: DC

## 2024-02-21 MED ORDER — IOHEXOL 350 MG/ML SOLN
75.0000 mL | Freq: Once | INTRAVENOUS | Status: AC | PRN
  Administered 2024-02-21: 75 mL via INTRAVENOUS

## 2024-02-21 NOTE — Significant Event (Signed)
 TCD's with concern for vasospasm.  Consistent findings on CTA head.  Patient is ESRD on HD, unable to really expand volume or dilute.  No aneurysm noted.  Suspect subarachnoid blood as sequela of intraventricular hemorrhage overall stable.  Will try to increase blood pressure mildly, new systolic goal 150.  Norepinephrine ordered.  Further recommendations per neurosurgery, happy to assist in obtaining these goals at their discretion.

## 2024-02-21 NOTE — Plan of Care (Signed)
  Problem: Clinical Measurements: Goal: Respiratory complications will improve Outcome: Progressing Goal: Cardiovascular complication will be avoided Outcome: Progressing   Problem: Activity: Goal: Risk for activity intolerance will decrease Outcome: Progressing   Problem: Nutrition: Goal: Adequate nutrition will be maintained Outcome: Progressing   Problem: Elimination: Goal: Will not experience complications related to urinary retention Outcome: Progressing   Problem: Pain Managment: Goal: General experience of comfort will improve and/or be controlled Outcome: Progressing   Problem: Safety: Goal: Ability to remain free from injury will improve Outcome: Progressing   Problem: Skin Integrity: Goal: Risk for impaired skin integrity will decrease Outcome: Progressing

## 2024-02-21 NOTE — Progress Notes (Signed)
 PT Cancellation Note  Patient Details Name: [name] [name] MRN: [id number] DOB: Aug 17, 1981   Cancelled Treatment:    Reason Eval/Treat Not Completed: Medical issues which prohibited therapy - pt somnolent, TCDs concerning for vasospasm. PT to hold until tomorrow.   Melvenia Favela S, PT DPT Acute Rehabilitation Services Secure Chat Preferred  Office [phone]    Ethridge Herder 02/21/2024, 2:04 PM

## 2024-02-21 NOTE — Progress Notes (Addendum)
 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 Hypertensive in ED SBP 240s. Went for CT a/p and was c/f some colitis. Started on abx, given IVF. C/o pain and was very agitated, rcvd pain meds. Admitted 6/12. Over the course of the day she became less responsive. Remained hypertensive w SBPs >200. Incr O2 req to [facility]. Pt was to rcv HD 6/12 afternoon but when eval by HD NP, concerns for airway protection. Rcvd narcan  with only modest improvement. PCCM consulted in this setting    Pertinent  Medical History  Failed renal / pancreas txp ESRD  HTN DM1 gastroparesis   Significant Hospital Events: Including procedures, antibiotic start and stop dates in addition to other pertinent events   6/11 ED w n/v AMS. Abx IVF for colitis 6/12 admit. AMS decr LOC. Incr O2 req. PCCM consult and code stroke called seen w rapid response nephro NP then neuro  6/13 intubated with EVD, responding to commands  6/14 extubated 6/18: Dialysis, fevering, EVD at 10 (129 mL last 24 hrs) 6/19 narcotic dependence a driving factor. + vasospasm on TCD CT angio 1. Evolving Left cerebellar infarct since 02/19/2024. No hemorrhagic transformation or mass effect. 2. CTA reveals a substantially decreased caliber of multiple intracranial arteries and bilateral circle-of-Willis branches (especially: Distal left V4, supraclinoid ICAs, MCA and ACA origins) when compared to 02/14/2024. The appearance most resembles a widespread Vasospasm, Vasoconstriction. No associated large vessel occlusion.  Interim History / Subjective:  Remains febrile, HD plan today (MWF), TCDs today (MWF)  Objective    Blood pressure (!) 142/62,  pulse (!) 106, temperature 99.2 F (37.3 C), temperature source Oral, resp. rate (!) 26, height 5' 7.5 (1.715 m), weight 53.5 kg, last menstrual period 02/13/2024, SpO2 94%.        Intake/Output Summary (Last 24 hours) at 02/21/2024 1149 Last data filed at 02/21/2024 1100 Gross per 24 hour  Intake 1590.07 ml  Output 1260 ml  Net 330.07 ml   Filed Weights   02/20/24 0455 02/20/24 1348 02/21/24 0500  Weight: 53.9 kg 53.9 kg 53.5 kg    Examination: General: acute and chronically ill appearing, resting in bed, in no obvious distress Neuro: opens eyes to voice, following commands in all four extremities, states a few words at a time, confused and lethargic HEENT: normocephalic, cortrak in place in L nare, CVC in R internal jugular, EVD on the right, mucous membranes dry Resp: Clear breath sounds, RRR, no use of accessory muscles CV: Regular rate, regular rhythm, palpable pulses in all, Fistula present in RUE GI: active bowel sounds, abdomen soft and non-tender GU: unable to visualize urine Skin: warm and dry to palpation, no wounds viable  Resolved problem list   Hypertensive emergency  Acute respiratory failure w hypoxia Assessment and Plan   Large intracranial hemorrhage with brain compression, ICH score 4  Scattered SAH, Communicating hydrocephalus: EVD placement  Hx prior ischemic stroke w/ on-going decreased LOC Narcotics contributing to AMS and decreased LOC and as of 6/19 having evidence of vasospasm  plan NSGY following/managing EVD per neurosurgery Continue Nimodipine  Stopping additional scheduled antihypertensives  For now we will adjust BP goal  to >140 but not exceed 160 as we still have not ruled out vascular anomaly  Need cerebral angiogram  PT/OT/Speech Continue to hold antithrombotic, SCDs  D/C PRN oxycodone  was going to start clonidine  for wd and narc dep but will have to hold off given vasopasm   Ongoing fever Does not appear infectious in nature, no  leukocytosis, pro cal normal and no shift Plan Blood cultures pending Schedule tylenol  650 mg Q6 Trend CBC  ESRD on HD  AGMA (persistent) etiology not clear  Hx failed renal txp Hypercalcemia plan Dialysis per nephrology Continue HCO3 tabs Daily RFP Daily CMP   Hypertension plan Continue Amlodipine , Carvedilol , Hydralazine  PRN, Labetalol  PRN, Losartan   Aspiration PNA  Pulmonary edema  plan SpO2 > 92%. Continue Zosyn  (6/19 end date) Pulmonary hygiene  Volume removal per HD.  Colitis  plan Last dose of zosyn  today (6/19) Rectal pouch Monitor for signs of bleeding   Type 1 Diabetes S/P Pancreatic transplant on chronic immunosuppression plan Monitor blood glucose SSI  continue mycophenolate  and prednisone     Best Practice (right click and Reselect all SmartList Selections daily)   Diet/type: NPO with tube feeds DVT prophylaxis SCD Pressure ulcer(s): pressure ulcer assessment deferred  GI prophylaxis: PPI Lines: N/A Foley:  N/A Code Status:  full code Last date of multidisciplinary goals of care discussion [friend updated 6/12. [place] ]   Critical care time:  per attending   The note above was created by A Pate S-NP, I reviewed and made revision where needed.   Hadley Leu ACNP-BC [facility] Pulmonary/Critical Care Pager # [phone] OR # [phone] if no answer

## 2024-02-21 NOTE — Progress Notes (Signed)
 Subjective: The patient is a little more somnolent than yesterday.  Objective: Vital signs in last 24 hours: Temp:  [98.9 F (37.2 C)-103.1 F (39.5 C)] 99.1 F (37.3 C) (06/19 1200) Pulse Rate:  [101-128] 123 (06/19 1330) Resp:  [17-34] 30 (06/19 1330) BP: (99-157)/(55-75) 156/69 (06/19 1330) SpO2:  [93 %-100 %] 96 % (06/19 1330) Weight:  [53.5 kg] 53.5 kg (06/19 0500) Estimated body mass index is 18.2 kg/m as calculated from the following:   Height as of this encounter: 5' 7.5 (1.715 m).   Weight as of this encounter: 53.5 kg.   Intake/Output from previous day: 06/18 0701 - 06/19 0700 In: 1390 [NG/GT:1290; IV Piggyback:100] Out: 1274 [Drains:174; Stool:200] Intake/Output this shift: Total I/O In: 500 [NG/GT:450; IV Piggyback:50] Out: 28 [Drains:28]  Physical exam the patient is somnolent but arousable.  She will tell me her name and knows she is at [facility].  Her ventriculostomy continues to drain.  I reviewed the patient's CTA.  She has a small left cerebellar infarct.  Her ventriculostomy is in good position.  She continues to have moderate ventriculomegaly.  There is widespread vasospasm.  Lab Results: Recent Labs    02/20/24 1125 02/20/24 2221 02/21/24 0845  WBC 9.4  --  8.8  HGB 11.3* 11.5* 10.3*  HCT 34.6* 34.7* 31.3*  PLT 180 179 162   BMET Recent Labs    02/20/24 1632 02/21/24 0458  NA 136 135  K 4.0 5.1  CL 92* 87*  CO2 27 22  GLUCOSE 155* 142*  BUN 26* 60*  CREATININE 2.23* 4.61*  CALCIUM  10.2 10.8*    Studies/Results: CT ANGIO HEAD W OR WO CONTRAST Addendum Date: 02/21/2024 ADDENDUM REPORT: 02/21/2024 13:05 ADDENDUM: Study reviewed in person with Dr. Ardella Beaver on 02/21/2024 at 13:05 . Electronically Signed   By: Marlise Simpers M.D.   On: 02/21/2024 13:05   Result Date: 02/21/2024 CLINICAL DATA:  43 year old female dialysis patient with acute intracranial hemorrhage on 02/14/2024. Subarachnoid and subdural blood. Right EVD. EXAM: CT  ANGIOGRAPHY HEAD WITH AND WITHOUT CONTRAST TECHNIQUE: Multidetector CT imaging of the head and neck was performed using the standard protocol during bolus administration of intravenous contrast. Multiplanar CT image reconstructions and MIPs were obtained to evaluate the vascular anatomy. Carotid stenosis measurements (when applicable) are obtained utilizing NASCET criteria, using the distal internal carotid diameter as the denominator. RADIATION DOSE REDUCTION: This exam was performed according to the departmental dose-optimization program which includes automated exposure control, adjustment of the mA and/or kV according to patient size and/or use of iterative reconstruction technique. CONTRAST:  75mL OMNIPAQUE  IOHEXOL  350 MG/ML SOLN COMPARISON:  Head CT yesterday. Admission CTA head and neck 02/14/2024. FINDINGS: CT HEAD Brain: Stable right superior frontal approach EVD. Stable ventricle size and configuration. Small volume residual IVH. No midline shift. Small volume residual posterior fossa intracranial hemorrhage, probably combined SAH and SDH. Stable small volume tentorial blood. 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 Supratentorial gray-white differentiation is stable. No supratentorial acute or evolving infarct identified. Calvarium and skull base: Stable right superior frontal burr hole. No acute osseous abnormality identified. Paranasal sinuses: Visualized paranasal sinuses and mastoids are stable and well aerated. Orbits: Left nasoenteric tube remains in place. Stable postoperative changes at the right vertex. Stable orbits. CTA HEAD Posterior circulation: Generalized distal vertebral artery calcified atherosclerosis. Both V3 and V4 segments appear patent.  There is new moderate to severe stenosis of the distal left V4 (circumferential narrowing series 11,  image 122), but remains patent to the vertebrobasilar junction. Distal right V4 segment appears stable. Right PICA origin remains patent. Left PICA origin occurs earlier, remains patent on series 11, image 145. Patent vertebrobasilar junction and basilar artery. Mild new mid basilar stenosis when compared to earlier this month (series 13, image 21). Stable SCA and PCA origins. However, PCA branches appear more diminutive and irregular, especially on the left (series 15, image 21) compared to earlier this month. But no PCA occlusion. Anterior circulation: Both ICA siphons are patent, heavily calcified as before. And there is similar new tapered appearance of the supraclinoid ICAs, carotid termini, MCA and ACA origins when compared to earlier this month (series 13, image 24). Diminutive but patent A1 and M1 segments now. Bilateral ACA and MCA branches similarly appear diffusely diminutive but remain patent (series 15, image 19 and series 14, image 18). Venous sinuses: Early contrast timing, not evaluated. Anatomic variants: Unchanged. Review of the MIP images confirms the above findings IMPRESSION: 1. Evolving Left cerebellar infarct since 02/19/2024. No hemorrhagic transformation or mass effect. 2. CTA reveals a substantially decreased caliber of multiple intracranial arteries and bilateral circle-of-Willis branches (especially: Distal left V4, supraclinoid ICAs, MCA and ACA origins) when compared to 02/14/2024. The appearance most resembles a widespread Vasospasm, Vasoconstriction. No associated large vessel occlusion. 3. Stable EVD, ventricle size, and small volume residual intracranial hemorrhage. Electronically Signed: By: Marlise Simpers M.D. On: 02/21/2024 13:00   VAS US  TRANSCRANIAL DOPPLER Result Date: 02/21/2024  Transcranial Doppler Patient Name:  [name] [name]  Date of Exam:   02/20/2024 Medical Rec #: [id number]           Accession #:    [id number] Date of Birth: April 15, 1981            Patient Gender: F  Patient Age:   43 years Exam Location:  [facility] Procedure:      VAS US  TRANSCRANIAL DOPPLER Referring Phys: Fraser Jackson XU --------------------------------------------------------------------------------  Indications: Subarachnoid hemorrhage. History: Large ICH with brain compression and scattered SAH. 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  Performing Technologist: Franky Ivanoff Sturdivant-Jones RDMS, RVT  Examination Guidelines: A complete evaluation includes B-mode imaging, spectral Doppler, color Doppler, and power Doppler as needed of all accessible portions of each vessel. Bilateral testing is considered an integral part of a complete examination. Limited examinations for reoccurring indications may be performed as noted.  +----------+---------------+----------+-----------+-------------+ RIGHT TCD Right VM (cm/s)Depth (cm)Pulsatility   Comment    +----------+---------------+----------+-----------+-------------+ MCA             130         5.10      1.11                  +----------+---------------+----------+-----------+-------------+ ACA             -61                   1.00                  +----------+---------------+----------+-----------+-------------+ Term ICA        40                    1.13                  +----------+---------------+----------+-----------+-------------+ PCA P1  54                    1.38                  +----------+---------------+----------+-----------+-------------+ Opthalmic       19                    1.63                  +----------+---------------+----------+-----------+-------------+ ICA siphon                                    NOT INSONATED +----------+---------------+----------+-----------+-------------+ Vertebral       -42                   1.16                  +----------+---------------+----------+-----------+-------------+ Distal ICA      33                     0.96                  +----------+---------------+----------+-----------+-------------+  +----------+--------------+----------+-----------+-------------+ LEFT TCD  Left VM (cm/s)Depth (cm)Pulsatility   Comment    +----------+--------------+----------+-----------+-------------+ MCA            110                   1.26                  +----------+--------------+----------+-----------+-------------+ ACA            -53                   1.13                  +----------+--------------+----------+-----------+-------------+ Term ICA        40                   1.34                  +----------+--------------+----------+-----------+-------------+ PCA P1          26                   1.36                  +----------+--------------+----------+-----------+-------------+ Opthalmic       19                   1.33                  +----------+--------------+----------+-----------+-------------+ ICA siphon                                   NOT INSONATED +----------+--------------+----------+-----------+-------------+ Vertebral      -14                   1.33                  +----------+--------------+----------+-----------+-------------+ Distal ICA      23                                         +----------+--------------+----------+-----------+-------------+  +------------+-------+-------------+  VM cm/s   Comment    +------------+-------+-------------+ Prox Basilar       NOT INSONATED +------------+-------+-------------+ Dist Basilar       NOT INSONATED +------------+-------+-------------+ +----------------------+---+ Right Lindegaard Ratio3.9 +----------------------+---+ +---------------------+---+ Left Lindegaard Ratio4.7 +---------------------+---+  Summary:  elevated mean flow velocities in right middle cerebral artery suggestive of moderate and left middle cerebral artery suggestive of mild vasospasm.Suboptimal suboccipital  window limits evaluation of posterior circulation. *See table(s) above for TCD measurements and observations.  Diagnosing physician: Ardella Beaver MD Electronically signed by Ardella Beaver MD on 02/21/2024 at 10:43:55 AM.    Final    DG Chest Port 1 View Result Date: 02/21/2024 CLINICAL DATA:  Pneumonia EXAM: PORTABLE CHEST 1 VIEW COMPARISON:  February 14, 2024 FINDINGS: Extubated. Feeding tube in the antrum of the stomach or first portion of the duodenal. Right IJ CVP line tip in the cavoatrial junction Persistent pulmonary infiltrates with interval improvement aeration of the lung may correlate with resolving pneumonia IMPRESSION: *Extubated. *Improving pneumonia. Electronically Signed   By: Fredrich Jefferson M.D.   On: 02/21/2024 07:14   CT HEAD WO CONTRAST ( ) Result Date: 02/20/2024 CLINICAL DATA:  Mental status change, unknown cause EXAM: CT HEAD WITHOUT CONTRAST TECHNIQUE: Contiguous axial images were obtained from the base of the skull through the vertex without intravenous contrast. RADIATION DOSE REDUCTION: This exam was performed according to the departmental dose-optimization program which includes automated exposure control, adjustment of the mA and/or kV according to patient size and/or use of iterative reconstruction technique. COMPARISON:  CT head June 17, 25. FINDINGS: Brain: Stable multi compartments acute hemorrhage including intraventricular hemorrhage layering in the occipital horns, multifocal infratentorial and supratentorial subarachnoid hemorrhage similar ventricular size with right frontal approach ventriculostomy catheter. No evidence of acute large vascular territory infarct, midline shift, or mass lesion. Vascular: No hyperdense vessel. Skull: No acute fracture. Sinuses/Orbits: Clear sinuses.  No acute orbital findings. Other: No mastoid effusions. IMPRESSION: 1. Stable multi compartment acute hemorrhage. No progressive mass effect or new/interval acute hemorrhage. 2. Stable ventricular  size with right frontal approach ventriculostomy catheter. Electronically Signed   By: Stevenson Elbe M.D.   On: 02/20/2024 21:04    Assessment/Plan: Subarachnoid hemorrhage, intraventricular hemorrhage, hydrocephalus, vasospasm: The patient is being treated with triple H therapy.  Her ventriculostomy is draining.  A cerebral arteriogram is planned when she is more stable to rule out aneurysm..  LOS: 7 days     [name] 02/21/2024, 1:50 PM     Patient ID: Read Camel, female   DOB: Apr 25, 1981, 43 y.o.   MRN: [id number]

## 2024-02-21 NOTE — Progress Notes (Addendum)
 STROKE TEAM PROGRESS NOTE    SIGNIFICANT HOSPITAL EVENTS 6/11- ED for nausea, vomiting after dialysis 6/12- intubated, EVD placed   INTERIM HISTORY/SUBJECTIVE  Overnight, patient was reported to be no longer following commands, STAT CT completed stable. On exam this morning, she is lethargic but does localize to pain with continued noxious stimuli. Additional labs added this morning. Patient received pain medication x3 yesterday and dialysis was stopped preemptively per patient's refusal to finsish. Likely that her mental status change is due to the sedative effects of the oxycodone --discussed with CCM, will discontinue.   Tmax overnight, 101.7, 103.1 this AM. She continues on Zosyn  for colitis.  Blood cultures drawn 6/18 with NGTD Cerebral catheter angiogram is on hold pending medical improvement.  EVD drainage continues to be stable.  Stat CTA head obtained today shows mild vasospasm involving both terminal carotids and left terminal vertebral artery.  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  Transcranial Doppler studies suggest moderate right MCA and mild left MCA vasospasm.  Discussed with Dr. Alinda Irani neuroradiologist and Dr. Nat Badger endovascular neurosurgeon. No family available at the bedside.  Blood pressure adequately controlled without need for gtts.  OBJECTIVE  CBC    Component Value Date/Time   WBC 9.4 02/20/2024 1125   RBC 3.72 (L) 02/20/2024 1125   HGB 11.5 (L) 02/20/2024 2221   HCT 34.7 (L) 02/20/2024 2221   PLT 179 02/20/2024 2221   MCV 93.0 02/20/2024 1125   MCH 30.4 02/20/2024 1125   MCHC 32.7 02/20/2024 1125   RDW 15.4 02/20/2024 1125   LYMPHSABS 1.2 02/20/2024 1125   MONOABS 0.7 02/20/2024 1125   EOSABS 0.0 02/20/2024 1125   BASOSABS 0.0 02/20/2024 1125    BMET    Component Value Date/Time   NA 135 02/21/2024 0458   K 5.1 02/21/2024 0458   CL 87 (L) 02/21/2024 0458   CO2 22 02/21/2024 0458   GLUCOSE 142 (H) 02/21/2024 0458    BUN 60 (H) 02/21/2024 0458   CREATININE 4.61 (H) 02/21/2024 0458   CALCIUM  10.8 (H) 02/21/2024 0458   GFRNONAA 12 (L) 02/21/2024 0458    IMAGING past 24 hours DG Chest Port 1 View Result Date: 02/21/2024 CLINICAL DATA:  Pneumonia EXAM: PORTABLE CHEST 1 VIEW COMPARISON:  February 14, 2024 FINDINGS: Extubated. Feeding tube in the antrum of the stomach or first portion of the duodenal. Right IJ CVP line tip in the cavoatrial junction Persistent pulmonary infiltrates with interval improvement aeration of the lung may correlate with resolving pneumonia IMPRESSION: *Extubated. *Improving pneumonia. Electronically Signed   By: Fredrich Jefferson M.D.   On: 02/21/2024 07:14   CT HEAD WO CONTRAST ( ) Result Date: 02/20/2024 CLINICAL DATA:  Mental status change, unknown cause EXAM: CT HEAD WITHOUT CONTRAST TECHNIQUE: Contiguous axial images were obtained from the base of the skull through the vertex without intravenous contrast. RADIATION DOSE REDUCTION: This exam was performed according to the departmental dose-optimization program which includes automated exposure control, adjustment of the mA and/or kV according to patient size and/or use of iterative reconstruction technique. COMPARISON:  CT head June 17, 25. FINDINGS: Brain: Stable multi compartments acute hemorrhage including intraventricular hemorrhage layering in the occipital horns, multifocal infratentorial and supratentorial subarachnoid hemorrhage similar ventricular size with right frontal approach ventriculostomy catheter. No evidence of acute large vascular territory infarct, midline shift, or mass lesion. Vascular: No hyperdense vessel. Skull: No acute fracture. Sinuses/Orbits: Clear sinuses.  No acute orbital findings. Other: No mastoid effusions. IMPRESSION: 1. Stable multi  compartment acute hemorrhage. No progressive mass effect or new/interval acute hemorrhage. 2. Stable ventricular size with right frontal approach ventriculostomy catheter.  Electronically Signed   By: Stevenson Elbe M.D.   On: 02/20/2024 21:04   VAS US  TRANSCRANIAL DOPPLER Result Date: 02/20/2024  Transcranial Doppler Patient Name:  LORINDA COPLAND  Date of Exam:   02/20/2024 Medical Rec #: [id number]           Accession #:    [id number] Date of Birth: 01-Apr-1981            Patient Gender: F Patient Age:   43 years Exam Location:  [facility] Procedure:      VAS US  TRANSCRANIAL DOPPLER Referring Phys: Fraser Jackson XU --------------------------------------------------------------------------------  Indications: Subarachnoid hemorrhage. History: Large ICH with brain compression and scattered SAH. 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  Performing Technologist: Franky Ivanoff Sturdivant-Jones RDMS, RVT  Examination Guidelines: A complete evaluation includes B-mode imaging, spectral Doppler, color Doppler, and power Doppler as needed of all accessible portions of each vessel. Bilateral testing is considered an integral part of a complete examination. Limited examinations for reoccurring indications may be performed as noted.  +----------+---------------+----------+-----------+-------------+ RIGHT TCD Right VM (cm/s)Depth (cm)Pulsatility   Comment    +----------+---------------+----------+-----------+-------------+ MCA             130         5.10      1.11                  +----------+---------------+----------+-----------+-------------+ ACA             -61                   1.00                  +----------+---------------+----------+-----------+-------------+ Term ICA        40                    1.13                  +----------+---------------+----------+-----------+-------------+ PCA P1          54                    1.38                  +----------+---------------+----------+-----------+-------------+ Opthalmic       19                    1.63                   +----------+---------------+----------+-----------+-------------+ ICA siphon                                    NOT INSONATED +----------+---------------+----------+-----------+-------------+ Vertebral       -42                   1.16                  +----------+---------------+----------+-----------+-------------+ Distal ICA      33                    0.96                  +----------+---------------+----------+-----------+-------------+  +----------+--------------+----------+-----------+-------------+ LEFT TCD  Left  VM (cm/s)Depth (cm)Pulsatility   Comment    +----------+--------------+----------+-----------+-------------+ MCA            110                   1.26                  +----------+--------------+----------+-----------+-------------+ ACA            -53                   1.13                  +----------+--------------+----------+-----------+-------------+ Term ICA        40                   1.34                  +----------+--------------+----------+-----------+-------------+ PCA P1          26                   1.36                  +----------+--------------+----------+-----------+-------------+ Opthalmic       19                   1.33                  +----------+--------------+----------+-----------+-------------+ ICA siphon                                   NOT INSONATED +----------+--------------+----------+-----------+-------------+ Vertebral      -14                   1.33                  +----------+--------------+----------+-----------+-------------+ Distal ICA      23                                         +----------+--------------+----------+-----------+-------------+  +------------+-------+-------------+             VM cm/s   Comment    +------------+-------+-------------+ Prox Basilar       NOT INSONATED +------------+-------+-------------+ Dist Basilar       NOT INSONATED  +------------+-------+-------------+ +----------------------+---+ Right Lindegaard Ratio3.9 +----------------------+---+ +---------------------+---+ Left Lindegaard Ratio4.7 +---------------------+---+    Preliminary      Vitals:   02/21/24 0530 02/21/24 0600 02/21/24 0630 02/21/24 0700  BP: 137/72 (!) 151/75 (!) 150/75 (!) 157/74  Pulse: (!) 122 (!) 120 (!) 123 (!) 124  Resp: (!) 29 (!) 29 (!) 30 (!) 31  Temp:      TempSrc:      SpO2: 97% 96% 97% 95%  Weight:      Height:         PHYSICAL EXAM General: Lethargic, NAD CV: ST, likely due to fever Respiratory: room air, unlabored  NEURO:  Lethargic,eyes closed, slightly opens eyes to repeated noxious stimuli gag and cough present.  Tongue protrusion not cooperative.  With deep sternal rub, localizes with right side.   LUE drift and unable to lift LLE off the bed.  Sensation, coordination and gait not tested.   ASSESSMENT/PLAN  Ms. Rakhi Naseer Nachreiner is a 43 y.o. female with  history of hypertension, ESRD on HD, s/p renal and pancreatic transplant, diabetes mellitus type 1 with gastroparesis presents with nausea and vomiting since after dialysis.  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 Intraventricular blood products layering in the occipital horns.  CTA head & neck - No large vessel occlusion. No high-grade stenosis, aneurysm, or dissection of the arteries in the head and neck. Mild stenosis of the V4 segments of both vertebral arteries. Mild stenosis of the right supraclinoid ICA.  6/12 repeat CT Head- Interval placement of a right frontal approach ventriculostomy with tip in the body of the right lateral ventricle. Persistent but relatively stable hydrocephalus at this time. Moderate volume acute subarachnoid hemorrhage with associated IVH, similar to  prior. No evidence for significant interval bleeding since prior. MRI  Small acute anterior corpus callosum infarct Mildly improved hydrocephalus with EVD in place Decreased perimesencephalic subarachnoid hemorrhage and IVH Trace supratentorial and infratentorial subdural hemorrhage Age advanced chronic small vessel ischemic disease with chronic lacunar infarcts Repeat CT 6/18: Stable multicompartment acute hemorrhage.  No progressive mass effect or new acute hemorrhage.  Stable ventricular size with right frontal approach. 2D Echo EF 60-65%, LA dilated  Will likely recommend 30-day monitor or LR on discharge Recommend cerebral angiogram once stable to rule out prepontine vascular anomaly On hold per NS, pending medically stable, afebrile LDL 63 HgbA1c 4.2 VTE prophylaxis - SCDs No antithrombotic prior to admission, continue No antithrombotic  Therapy recommendations:  Pending Disposition:  Pending   Cerebral edema with brain compression Hydrocephalous s/p EVD CT head Increased caliber of the ventricles concerning for developing communicating hydrocephalus.  NSGY consulted EVD placement 6/12, continued per NS Keppra  discontinued 6/17  ? Vasospasm  CTA head and neck Mild irregularity of the basilar artery without significant narrowing. Finding concerning for mild vasospasm. TCD monitoring  6/16: No definite evidence of vasospasm noted in identified blood vessels.  6/18: elevated mean flow velocities in right middle cerebral artery suggestive  of moderate and left middle cerebral artery suggestive of mild  vasospasm.  6/19: PENDING Continue nimodipine    Respiratory failure  Aspiration pneumonia PCCM primary Intubated 6/12, extubated 6/14 Continued on Zosyn  for aspiration and colitis, 8-day course  Hypertensive Emergency  Home meds:  Norvasc , coreg , clonidine  patch, hydralazine   Unstable Cleviprex , labetalol  PRN BP goal < 160 Long term BP goal normotensive  Diabetes Type  1 Hx of pancreas transplant  HgbA1c 4.2, goal < 7.0 CBGs, SSI  ESRD on HD Hx of renal transplant  HD patient with fistula Nephrology on board CRRT --> HD treatments now   Dysphagia NG tube in place Speech therapy consult Nutrition consult for tube feeds  Other medical issues DM gastroparesis  Colitis  Continued on Zosyn  Thrombocytopenia, improved - platelet [phone] Additional Labs sent B12, Folte, RPR, TSH, Ammonia  Hospital day # 7  Pt seen by Neuro NP/APP with MD. Note/plan to be edited by MD as needed.    Audrene Lease, DNP [name] Please use AMION for contact information & EPIC for messaging.  I have personally obtained history,examined this patient, reviewed notes, independently viewed imaging studies, participated in medical decision making and plan of care.ROS completed by me personally and pertinent positives fully documented  I have made any additions or clarifications directly to the above note. Agree with note above.  Patient mental status continues  to fluctuate and transcranial Doppler studies today's suggest moderate right and mild left vasospasm.  CT angiogram obtained after rounds today confirms these findings along with terminal left vertebral artery vasospasm as well as new right cerebellar infarct.  Case discussed with Dr. Nat Badger neurosurgeon and Dr. Marygrace Snellen critical care medicine.  Will try gentle induced hypertension and hypervolemia.  Dr. Nat Badger to decide on timing of cerebral catheter angiogram and possible endovascular treatment of vasospasm if needed.  No family available at the bedside. This patient is critically ill and at significant risk of neurological worsening, death and care requires constant monitoring of vital signs, hemodynamics,respiratory and cardiac monitoring, extensive review of multiple databases, frequent neurological assessment, discussion with family, other specialists and medical decision making of high  complexity.I have made any additions or clarifications directly to the above note.This critical care time does not reflect procedure time, or teaching time or supervisory time of PA/NP/Med Resident etc but could involve care discussion time.  I spent 50 minutes of neurocritical care time  in the care of  this patient.     Ardella Beaver, MD Medical Director [facility] Pager: [phone] 02/21/2024 1:23 PM

## 2024-02-21 NOTE — Progress Notes (Signed)
 [facility] Progress Note   Subjective: Better today!  For HD Objective Vitals:   02/21/24 0700 02/21/24 0800 02/21/24 0900 02/21/24 1000  BP: (!) 157/74 135/67 124/64 130/61  Pulse: (!) 124 (!) 125 (!) 123 (!) 118  Resp: (!) 31 (!) 31 (!) 33 (!) 32  Temp:  (!) 103.1 F (39.5 C)  99.2 F (37.3 C)  TempSrc:  Axillary  Oral  SpO2: 95% 96% 95% 95%  Weight:      Height:       Physical Exam General: ill appearing, eyes closed but talking Heart: RRR Lungs: clear on [place] Abdomen:soft Extremities: minimal edema Dialysis Access: temp femoral HD catheter, RUE AV access +t/b  Additional Objective Labs: Basic Metabolic Panel: Recent Labs  Lab 02/20/24 0455 02/20/24 1632 02/21/24 0458  NA 135 136 135  K 4.5 4.0 5.1  CL 92* 92* 87*  CO2 18* 27 22  GLUCOSE 162* 155* 142*  BUN 72* 26* 60*  CREATININE 5.49* 2.23* 4.61*  CALCIUM  10.7* 10.2 10.8*  PHOS 7.4* 3.0 7.2*   Liver Function Tests: Recent Labs  Lab 02/20/24 0455 02/20/24 1632 02/21/24 0458  ALBUMIN 3.1* 3.4* 3.2*   No results for input(s): LIPASE, AMYLASE in the last 168 hours.  CBC: Recent Labs  Lab 02/15/24 0430 02/16/24 0513 02/17/24 0928 02/19/24 1639 02/20/24 1125 02/20/24 2221 02/21/24 0845  WBC 7.3 7.4 9.0  --  9.4  --  8.8  NEUTROABS  --   --   --   --  7.3  --   --   HGB 9.6* 10.2* 11.4*   < > 11.3* 11.5* 10.3*  HCT 30.8* 32.2* 34.6*   < > 34.6* 34.7* 31.3*  MCV 97.2 95.3 94.0  --  93.0  --  94.6  PLT 119* 136* 156  --  180 179 162   < > = values in this interval not displayed.   Blood Culture    Component Value Date/Time   SDES BLOOD LEFT ARM 02/20/2024 1125   SPECREQUEST  02/20/2024 1125    BOTTLES DRAWN AEROBIC AND ANAEROBIC Blood Culture adequate volume   CULT  02/20/2024 1125    NO GROWTH < 24 HOURS Performed at [facility], [address], [place], [name] [address]    REPTSTATUS PENDING 02/20/2024 1125    Cardiac Enzymes: No results for input(s):  CKTOTAL, CKMB, CKMBINDEX, TROPONINI in the last 168 hours. CBG: Recent Labs  Lab 02/20/24 1601 02/20/24 2014 02/21/24 0017 02/21/24 0418 02/21/24 0818  GLUCAP 144* 175* 123* 165* 167*   Iron  Studies: No results for input(s): IRON , TIBC, TRANSFERRIN, FERRITIN in the last 72 hours. @lablastinr3 @ Studies/Results: VAS US  TRANSCRANIAL DOPPLER Result Date: 02/21/2024  Transcranial Doppler Patient Name:  MEGON KALINA  Date of Exam:   02/20/2024 Medical Rec #: [id number]           Accession #:    [id number] Date of Birth: [birth year]            Patient Gender: F Patient Age:   43 years Exam Location:  [facility] Procedure:      VAS US  TRANSCRANIAL DOPPLER Referring Phys: Fraser Jackson XU --------------------------------------------------------------------------------  Indications: Subarachnoid hemorrhage. History: Large ICH with brain compression and scattered SAH. 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  Performing Technologist: Franky Ivanoff Sturdivant-Jones RDMS, RVT  Examination Guidelines: A complete evaluation includes B-mode imaging, spectral Doppler, color Doppler, and power Doppler as needed of all accessible  portions of each vessel. Bilateral testing is considered an integral part of a complete examination. Limited examinations for reoccurring indications may be performed as noted.  +----------+---------------+----------+-----------+-------------+ RIGHT TCD Right VM (cm/s)Depth (cm)Pulsatility   Comment    +----------+---------------+----------+-----------+-------------+ MCA             130         5.10      1.11                  +----------+---------------+----------+-----------+-------------+ ACA             -61                   1.00                  +----------+---------------+----------+-----------+-------------+ Term ICA        40                    1.13                   +----------+---------------+----------+-----------+-------------+ PCA P1          54                    1.38                  +----------+---------------+----------+-----------+-------------+ Opthalmic       19                    1.63                  +----------+---------------+----------+-----------+-------------+ ICA siphon                                    NOT INSONATED +----------+---------------+----------+-----------+-------------+ Vertebral       -42                   1.16                  +----------+---------------+----------+-----------+-------------+ Distal ICA      33                    0.96                  +----------+---------------+----------+-----------+-------------+  +----------+--------------+----------+-----------+-------------+ LEFT TCD  Left VM (cm/s)Depth (cm)Pulsatility   Comment    +----------+--------------+----------+-----------+-------------+ MCA            110                   1.26                  +----------+--------------+----------+-----------+-------------+ ACA            -53                   1.13                  +----------+--------------+----------+-----------+-------------+ Term ICA        40                   1.34                  +----------+--------------+----------+-----------+-------------+ PCA P1          26  1.36                  +----------+--------------+----------+-----------+-------------+ Opthalmic       19                   1.33                  +----------+--------------+----------+-----------+-------------+ ICA siphon                                   NOT INSONATED +----------+--------------+----------+-----------+-------------+ Vertebral      -14                   1.33                  +----------+--------------+----------+-----------+-------------+ Distal ICA      23                                          +----------+--------------+----------+-----------+-------------+  +------------+-------+-------------+             VM cm/s   Comment    +------------+-------+-------------+ Prox Basilar       NOT INSONATED +------------+-------+-------------+ Dist Basilar       NOT INSONATED +------------+-------+-------------+ +----------------------+---+ Right Lindegaard Ratio3.9 +----------------------+---+ +---------------------+---+ Left Lindegaard Ratio4.7 +---------------------+---+  Summary:  elevated mean flow velocities in right middle cerebral artery suggestive of moderate and left middle cerebral artery suggestive of mild vasospasm.Suboptimal suboccipital window limits evaluation of posterior circulation. *See table(s) above for TCD measurements and observations.  Diagnosing physician: Ardella Beaver MD Electronically signed by Ardella Beaver MD on 02/21/2024 at 10:43:55 AM.    Final    DG Chest Port 1 View Result Date: 02/21/2024 CLINICAL DATA:  Pneumonia EXAM: PORTABLE CHEST 1 VIEW COMPARISON:  February 14, 2024 FINDINGS: Extubated. Feeding tube in the antrum of the stomach or first portion of the duodenal. Right IJ CVP line tip in the cavoatrial junction Persistent pulmonary infiltrates with interval improvement aeration of the lung may correlate with resolving pneumonia IMPRESSION: *Extubated. *Improving pneumonia. Electronically Signed   By: Fredrich Jefferson M.D.   On: 02/21/2024 07:14   CT HEAD WO CONTRAST ( ) Result Date: 02/20/2024 CLINICAL DATA:  Mental status change, unknown cause EXAM: CT HEAD WITHOUT CONTRAST TECHNIQUE: Contiguous axial images were obtained from the base of the skull through the vertex without intravenous contrast. RADIATION DOSE REDUCTION: This exam was performed according to the departmental dose-optimization program which includes automated exposure control, adjustment of the mA and/or kV according to patient size and/or use of iterative reconstruction technique. COMPARISON:   CT head June 17, 25. FINDINGS: Brain: Stable multi compartments acute hemorrhage including intraventricular hemorrhage layering in the occipital horns, multifocal infratentorial and supratentorial subarachnoid hemorrhage similar ventricular size with right frontal approach ventriculostomy catheter. No evidence of acute large vascular territory infarct, midline shift, or mass lesion. Vascular: No hyperdense vessel. Skull: No acute fracture. Sinuses/Orbits: Clear sinuses.  No acute orbital findings. Other: No mastoid effusions. IMPRESSION: 1. Stable multi compartment acute hemorrhage. No progressive mass effect or new/interval acute hemorrhage. 2. Stable ventricular size with right frontal approach ventriculostomy catheter. Electronically Signed   By: Stevenson Elbe M.D.   On: 02/20/2024 21:04   CT HEAD WO CONTRAST ( ) Result Date: 02/19/2024 CLINICAL DATA:  Altered mental status and history of brain  bleed. EXAM: CT HEAD WITHOUT CONTRAST TECHNIQUE: Contiguous axial images were obtained from the base of the skull through the vertex without intravenous contrast. RADIATION DOSE REDUCTION: This exam was performed according to the departmental dose-optimization program which includes automated exposure control, adjustment of the mA and/or kV according to patient size and/or use of iterative reconstruction technique. COMPARISON:  CT of the head dated February 14, 2024. FINDINGS: Brain: There has been significant interval improvement of subarachnoid hemorrhage seen primarily within the prepontine cistern and left sylvian fissure. There is minimal residual subarachnoid hemorrhage within the right sylvian fissure. There has been interval improvement of interventricular hemorrhage. There is also been significant interval improvement of hydrocephalus, although there is continued mild ballooning of the temporal horns. A right frontal approach ventriculostomy catheter is again demonstrated with its tip abutting the septum  pellucidum in the midbody of the right lateral ventricle. There is no apparent intraparenchymal hemorrhage. There is diminished density present in the left paracentral pons. There is mild periventricular white matter disease. Vascular: Calcific atheromatous disease within the carotid siphons and vertebral arteries bilaterally. Skull: Right frontal approach ventriculostomy. The calvaria is otherwise intact. Sinuses/Orbits: Status post bilateral cataract surgery. The visualized paranasal sinuses are clear. Other: None. IMPRESSION: 1. Significant interval improvement of subarachnoid and intraventricular hemorrhage. 2. Interval improvement of hydrocephalus status post placement of right frontal approach ventriculostomy catheter. Electronically Signed   By: Maribeth Shivers M.D.   On: 02/19/2024 13:48    Medications:  feeding supplement (OSMOLITE 1.5 CAL) 50 mL/hr at 02/21/24 1100   piperacillin -tazobactam (ZOSYN )  IV Stopped (02/21/24 0817)    amLODipine   10 mg Per Tube Daily   Chlorhexidine  Gluconate Cloth  6 each Topical Daily   cycloSPORINE   25 mg Per Tube Daily   doxercalciferol   4 mcg Intravenous Q M,W,F-HD   fiber supplement (BANATROL TF)  60 mL Per Tube BID   insulin  aspart  0-9 Units Subcutaneous Q4H   losartan   50 mg Per Tube Daily   mycophenolate   500 mg Per Tube BID   niMODipine   60 mg Per Tube Q4H   mouth rinse  15 mL Mouth Rinse 4 times per day   predniSONE   15 mg Per Tube Q breakfast   sodium bicarbonate   650 mg Per Tube TID   sodium chloride  flush  3 mL Intravenous Q12H   sodium chloride  flush  3-10 mL Intravenous Q12H    Dialysis Orders: [facility] MWF 3:45 hrs 180NRe 400/500 57.2 kg 2.0 K/ 2.0 Ca AVF - No heparin  - Hectorol  4 mcg IV three times per week - Mircera 150 mcg IV q 2 weeks (last dose 02/11/2024 next dose due 02/25/2024)  Assessment/Plan:  Altered mental status secondary to ICH complicated by hydrocephalus:  Has drain in now. Management per neuro/CCM.  MRI showed  improvement of hydrocephalus and limiting sedating meds.   Acute hypoxic respiratory failure- improved with UF.  Also on pip/tazo for aspiration.   ESRD - MWF HD typically, s/pCRRT since 6/12.  HD 6/16, 6/18, 6/20  Hypertension/volume  - Chronic difficult to manage HTN. Bp goals per CCM currently. Cleviprex  now off, on enteral meds.   Anemia  - HGB stable 10s. Recent ESA. Follow labs.   Metabolic bone disease -  Chronic noncompliance with binders. Phos currently in 3s. Resume binder when needed. On VDRA.   Nutrition - Albumin OK. Enteral nutrition currently.   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 Continues on immunosuppressant [place] for pancreas.  Pharm provided liquid dosing equivalents  with NGT.  Followed by [facility].  Sepsis: keeps spiking fevers- fem cath out.  Leandra Pro MD 02/21/2024, 11:07 AM  [facility]

## 2024-02-21 NOTE — Progress Notes (Signed)
 SLP Cancellation Note  Patient Details Name: [name] [name] MRN: [id number] DOB: Dec 22, 1980   Cancelled treatment:       Reason Eval/Treat Not Completed: Patient's level of consciousness Per RN, patient has been very lethargic, not alert enough to try any PO's. SLP will follow up when patient adequately alert.  Jacqualine Mater, MA, CCC-SLP Speech Therapy

## 2024-02-21 NOTE — Progress Notes (Signed)
 Nutrition Follow-up  DOCUMENTATION CODES:   Non-severe (moderate) malnutrition in context of chronic illness  INTERVENTION:    Continue tube feeding via Cortrak tube: Increase Osmolite 1.5 to 55 ml/hr (1320 ml per day)  Provides 1980 kcal, 82 gm protein, 1003 ml free water  daily   NUTRITION DIAGNOSIS:   Moderate Malnutrition related to chronic illness (ESRD on HD/DM) as evidenced by mild fat depletion, mild muscle depletion. Ongoing.   GOAL:   Patient will meet greater than or equal to 90% of their needs Met with TF at goal  MONITOR:   Vent status, TF tolerance  REASON FOR ASSESSMENT:   Consult, Ventilator Enteral/tube feeding initiation and management  ASSESSMENT:   Pt with PMH of HTN, ESRD on HD, s/p renal and pancreatic transplant 07/16/13, DM type 1 with gastroparesis admitted with N/V after HD with ICH brainstem hemorrhage with IVH, likely hypertensive.   Pt discussed during ICU rounds and with RN and MD.  Extubated, EVD remains in place. Noted pt now with evidence of vasospasm.  Per RN pt more lethargic today.   6/11 - ED for abx for colitis 6/12 - admit for ICH; intubated with EVD; started CRRT 6/13 - s/p cortrak placement; tip gastric 6/14 - extubated  6/15 - CRRT stopped  Medications reviewed and include: banatrol TF BID, cellcept , nimodipine , prednisone , 650 mg socium bicarbonate TID Levophed @ 2 mcg   Labs reviewed:  Phos 7.2 Folate 16.8 Vitamin B12 341 CBG's: 123-167  ICP 174 ml UF 900 ml x 24 hours   Current weight 53.5 kg EDW 57.2 kg Admission weight 59.3 kg   Diet Order:   Diet Order             Diet NPO time specified  Diet effective now                   EDUCATION NEEDS:   No education needs have been identified at this time  Skin:  Skin Assessment: Reviewed RN Assessment  Last BM:  unmeasured, FMS placed 6/16  Height:   Ht Readings from Last 1 Encounters:  02/14/24 5' 7.5 (1.715 m)    Weight:   Wt  Readings from Last 1 Encounters:  02/21/24 53.5 kg    BMI:  Body mass index is 18.2 kg/m.  Estimated Nutritional Needs:   Kcal:  1800-2000  Protein:  75-90 grams  Fluid:  1L + UOP  Deng Kemler P., RD, LDN, CNSC See AMiON for contact information

## 2024-02-22 ENCOUNTER — Inpatient Hospital Stay (HOSPITAL_COMMUNITY)

## 2024-02-22 DIAGNOSIS — I609 Nontraumatic subarachnoid hemorrhage, unspecified: Secondary | ICD-10-CM

## 2024-02-22 DIAGNOSIS — I6389 Other cerebral infarction: Secondary | ICD-10-CM | POA: Diagnosis not present

## 2024-02-22 DIAGNOSIS — I608 Other nontraumatic subarachnoid hemorrhage: Secondary | ICD-10-CM | POA: Diagnosis not present

## 2024-02-22 DIAGNOSIS — G935 Compression of brain: Secondary | ICD-10-CM | POA: Diagnosis not present

## 2024-02-22 DIAGNOSIS — J810 Acute pulmonary edema: Secondary | ICD-10-CM | POA: Diagnosis not present

## 2024-02-22 DIAGNOSIS — J9601 Acute respiratory failure with hypoxia: Secondary | ICD-10-CM | POA: Diagnosis not present

## 2024-02-22 DIAGNOSIS — G936 Cerebral edema: Secondary | ICD-10-CM | POA: Diagnosis not present

## 2024-02-22 DIAGNOSIS — I615 Nontraumatic intracerebral hemorrhage, intraventricular: Secondary | ICD-10-CM | POA: Diagnosis not present

## 2024-02-22 LAB — CBC
HCT: 30.8 % — ABNORMAL LOW (ref 36.0–46.0)
Hemoglobin: 10.3 g/dL — ABNORMAL LOW (ref 12.0–15.0)
MCH: 30.9 pg (ref 26.0–34.0)
MCHC: 33.4 g/dL (ref 30.0–36.0)
MCV: 92.5 fL (ref 80.0–100.0)
Platelets: 172 10*3/uL (ref 150–400)
RBC: 3.33 MIL/uL — ABNORMAL LOW (ref 3.87–5.11)
RDW: 15.2 % (ref 11.5–15.5)
WBC: 7.5 10*3/uL (ref 4.0–10.5)
nRBC: 0 % (ref 0.0–0.2)

## 2024-02-22 LAB — RENAL FUNCTION PANEL
Albumin: 3.1 g/dL — ABNORMAL LOW (ref 3.5–5.0)
Albumin: 2.9 g/dL — ABNORMAL LOW (ref 3.5–5.0)
Anion gap: 29 — ABNORMAL HIGH (ref 5–15)
Anion gap: 33 — ABNORMAL HIGH (ref 5–15)
BUN: 102 mg/dL — ABNORMAL HIGH (ref 6–20)
BUN: 121 mg/dL — ABNORMAL HIGH (ref 6–20)
CO2: 17 mmol/L — ABNORMAL LOW (ref 22–32)
CO2: 14 mmol/L — ABNORMAL LOW (ref 22–32)
Calcium: 11.2 mg/dL — ABNORMAL HIGH (ref 8.9–10.3)
Calcium: 11.4 mg/dL — ABNORMAL HIGH (ref 8.9–10.3)
Chloride: 87 mmol/L — ABNORMAL LOW (ref 98–111)
Chloride: 87 mmol/L — ABNORMAL LOW (ref 98–111)
Creatinine, Ser: 6.74 mg/dL — ABNORMAL HIGH (ref 0.44–1.00)
Creatinine, Ser: 7.69 mg/dL — ABNORMAL HIGH (ref 0.44–1.00)
GFR, Estimated: 7 mL/min — ABNORMAL LOW (ref >60)
GFR, Estimated: 6 mL/min — ABNORMAL LOW (ref >60)
Glucose, Bld: 138 mg/dL — ABNORMAL HIGH (ref 70–99)
Glucose, Bld: 143 mg/dL — ABNORMAL HIGH (ref 70–99)
Phosphorus: 10.5 mg/dL — ABNORMAL HIGH (ref 2.5–4.6)
Phosphorus: 10.8 mg/dL — ABNORMAL HIGH (ref 2.5–4.6)
Potassium: 5.7 mmol/L — ABNORMAL HIGH (ref 3.5–5.1)
Potassium: 6.2 mmol/L — ABNORMAL HIGH (ref 3.5–5.1)
Sodium: 133 mmol/L — ABNORMAL LOW (ref 135–145)
Sodium: 134 mmol/L — ABNORMAL LOW (ref 135–145)

## 2024-02-22 LAB — GLUCOSE, CAPILLARY
Glucose-Capillary: 135 mg/dL — ABNORMAL HIGH (ref 70–99)
Glucose-Capillary: 143 mg/dL — ABNORMAL HIGH (ref 70–99)
Glucose-Capillary: 148 mg/dL — ABNORMAL HIGH (ref 70–99)

## 2024-02-22 LAB — MAGNESIUM: Magnesium: 3.1 mg/dL — ABNORMAL HIGH (ref 1.7–2.4)

## 2024-02-22 LAB — PROCALCITONIN: Procalcitonin: 1.43 ng/mL

## 2024-02-22 LAB — CULTURE, BLOOD (ROUTINE X 2): Special Requests: ADEQUATE

## 2024-02-22 MED ORDER — TRAMADOL HCL 50 MG PO TABS
50.0000 mg | ORAL_TABLET | Freq: Once | ORAL | Status: AC
  Administered 2024-02-22: 50 mg
  Filled 2024-02-22: qty 1

## 2024-02-22 MED ORDER — KETOROLAC TROMETHAMINE 15 MG/ML IJ SOLN: 7.5000 mg | Freq: Once | INTRAMUSCULAR | Status: DC

## 2024-02-22 MED ORDER — BANATROL TF EN LIQD
60.0000 mL | Freq: Four times a day (QID) | ENTERAL | Status: DC
  Administered 2024-02-22 – 2024-02-23 (×7): 60 mL
  Filled 2024-02-22 (×7): qty 60

## 2024-02-22 MED ORDER — HEPARIN SODIUM (PORCINE) 5000 UNIT/ML IJ SOLN
5000.0000 [IU] | Freq: Three times a day (TID) | INTRAMUSCULAR | Status: DC
  Administered 2024-02-22 – 2024-03-12 (×56): 5000 [IU] via SUBCUTANEOUS
  Filled 2024-02-22 (×58): qty 1

## 2024-02-22 MED ORDER — ASPIRIN 81 MG PO CHEW
81.0000 mg | CHEWABLE_TABLET | Freq: Every day | ORAL | Status: DC
  Administered 2024-02-22 – 2024-03-13 (×20): 81 mg
  Filled 2024-02-22 (×20): qty 1

## 2024-02-22 MED ORDER — FAMOTIDINE 20 MG PO TABS
10.0000 mg | ORAL_TABLET | Freq: Every day | ORAL | Status: AC
  Administered 2024-02-23 – 2024-02-25 (×4): 10 mg
  Filled 2024-02-22 (×4): qty 1

## 2024-02-22 MED ORDER — ACETAMINOPHEN 325 MG PO TABS
650.0000 mg | ORAL_TABLET | ORAL | Status: DC
  Administered 2024-02-22 – 2024-03-01 (×43): 650 mg
  Administered 2024-03-01: 325 mg
  Administered 2024-03-01 – 2024-03-02 (×3): 650 mg
  Administered 2024-03-02: 325 mg
  Administered 2024-03-02 – 2024-03-13 (×46): 650 mg
  Filled 2024-02-22 (×105): qty 2

## 2024-02-22 MED ORDER — ACETAMINOPHEN 650 MG RE SUPP
650.0000 mg | RECTAL | Status: DC
  Administered 2024-03-06: 650 mg via RECTAL
  Filled 2024-02-22 (×4): qty 1

## 2024-02-22 MED ORDER — SODIUM BICARBONATE 650 MG PO TABS
1300.0000 mg | ORAL_TABLET | Freq: Three times a day (TID) | ORAL | Status: DC
  Administered 2024-02-22 – 2024-03-12 (×55): 1300 mg
  Filled 2024-02-22 (×58): qty 2

## 2024-02-22 NOTE — Progress Notes (Signed)
 Transcranial Doppler   Date POD PCO2 HCT BP   MCA ACA PCA OPHT SIPH VERT Basilar  6/16 CK       130/59 Right  Left   *  13   *     -50 *     25 25     21  *     * -28     -18 -22       6/18 rs         Right  Left   130     110 -61     -53 54     26 19     19  *     * -42     -14 *        6/20 CK         Right  Left    131   24    *   *    -32   -27    20   27     *   *    *   *    *                   Right  Left                                                                 Right  Left                                                               Right  Left                                                               Right  Left                                                     *Unable to insonate Lindegaard Ratio:  RIGHT - 8.7  -LEFT - 1.4 MCA = Middle Cerebral Artery      OPHT = Opthalmic Artery     BASILAR = Basilar Artery   ACA = Anterior Cerebral Artery     SIPH = Carotid Siphon PCA = Posterior Cerebral Artery   VERT = Verterbral Artery                    Normal MCA = 62+\-12 ACA = 50+\-12 PCA = 42+\-23

## 2024-02-22 NOTE — Progress Notes (Addendum)
 NAME:  [name] [name], MRN:  [id number], DOB:  08/24/1981, LOS: 8 ADMISSION DATE:  02/13/2024, CONSULTATION DATE:  02/14/24 REFERRING MD:  Felipe Horton, CHIEF COMPLAINT:  AMS    History of Present Illness:   43 yo F PMH ESRD on HD, HTN, failed renal txp,  pancreatic txp on mycophenolate  and pred 15, DM1 w gastroparesis who presented to ED 6/11 with n/v, abd pain. Hypertensive in ED SBP 240s. Went for CT a/p and was c/f some colitis. Started on abx, given IVF. C/o pain and was very agitated, rcvd pain meds. Admitted 6/12. Over the course of the day she became less responsive. Remained hypertensive w SBPs >200. Incr O2 req to HHFNC. Pt was to rcv HD 6/12 afternoon but when eval by HD NP, concerns for airway protection. Rcvd narcan  with only modest improvement. PCCM consulted in this setting    Pertinent  Medical History  Failed renal / pancreas txp ESRD  HTN DM1 gastroparesis   Significant Hospital Events: Including procedures, antibiotic start and stop dates in addition to other pertinent events   6/11 ED w n/v AMS. Abx IVF for colitis 6/12 admit. AMS decr LOC. Incr O2 req. PCCM consult and code stroke called seen w rapid response nephro NP then neuro  6/13 intubated with EVD, responding to commands  6/14 extubated 6/18: Dialysis, fevering, EVD at 10 (129 mL last 24 hrs) 6/19 narcotic dependence a driving factor. + vasospasm on TCD CT angio 1. Evolving Left cerebellar infarct since 02/19/2024. No hemorrhagic transformation or mass effect. 2. CTA reveals a substantially decreased caliber of multiple intracranial arteries and bilateral circle-of-Willis branches (especially: Distal left V4, supraclinoid ICAs, MCA and ACA origins) when compared to 02/14/2024. The appearance most resembles a widespread Vasospasm, Vasoconstriction. No associated large vessel occlusion.  Interim History / Subjective:   Waxes and wanes   Objective    Blood pressure (!) 172/74, pulse (!) 105, temperature 98.1 F  (36.7 C), temperature source Axillary, resp. rate (!) 25, height 5' 7.5 (1.715 m), weight 57.2 kg, last menstrual period 02/13/2024, SpO2 98%.        Intake/Output Summary (Last 24 hours) at 02/22/2024 0731 Last data filed at 02/22/2024 0700 Gross per 24 hour  Intake 2039.67 ml  Output 874 ml  Net 1165.67 ml   Filed Weights   02/20/24 1348 02/21/24 0500 02/22/24 0500  Weight: 53.9 kg 53.5 kg 57.2 kg    Examination: General resting in bed. No distress. Slow to wake up but earlier was oriented x 3 and engaged w/ nursing (10 min before I entered)  HENT NCAT IVC pink tinged CSF Neuro slow to awaken, speech slurred. Moves all ext equal st. Oriented x 2 for me Pulm clear dec bases Card rrr Abd soft Ext warm  Gu anuric   Resolved problem list   Hypertensive emergency  Acute respiratory failure w hypoxia Aspiration PNA (completed rx 6/19) Assessment and Plan   Large intracranial hemorrhage with brain compression, ICH score 4  Scattered SAH, Communicating hydrocephalus: EVD placement  Hx prior ischemic stroke w/ on-going decreased LOC Narcotics contributing to AMS and decreased LOC and as of 6/19 having evidence of vasospasm  plan NSGY following/managing EVD per neurosurgery Continue Nimodipine  Goal SBP 140-160 but not exceed 160 Need cerebral angiogram  Changed antihypertensives to PRN; also has PRN NE  PT/OT/Speech Continue to hold antithrombotic, SCDs  D/C'd PRN oxycodone   Scheduled apap Consider adding PRN tramadol  (got last night)   Ongoing fever Does not appear infectious  in nature, no leukocytosis, pro cal normal and no shift cultures neg Plan Schedule tylenol  650 mg Q6 Trend CBC  ESRD on HD  AGMA (persistent) etiology not clear  Hx failed renal txp Hypercalcemia plan Dialysis per nephrology Continue HCO3 tabs (inc d/t loss from diarrhea)  Daily RFP Daily CMP   Hypertension plan See above (now goal is keep sbp 140-160)  Will use PRNs  Colitis w/  on-going diarrhea Last dose of zosyn  today (6/19) Plan Rectal pouch Monitor for signs of bleeding Added immodium 6/19 Simethicone  PRN for pain. Inc fiber  Type 1 Diabetes S/P Pancreatic transplant on chronic immunosuppression Glycemic control acceptable plan Monitor blood glucose SSI  continue mycophenolate  and prednisone     Best Practice (right click and Reselect all SmartList Selections daily)   Diet/type: NPO with tube feeds DVT prophylaxis prophylactic heparin   and asa started 6/20 Pressure ulcer(s): pressure ulcer assessment deferred  GI prophylaxis: PPI Lines: N/A Foley:  N/A Code Status:  full code Last date of multidisciplinary goals of care discussion [friend updated 6/12. [place] ]   Critical care time: 32 min

## 2024-02-22 NOTE — Progress Notes (Signed)
 SLP Cancellation Note  Patient Details Name: [name] [name] MRN: [id number] DOB: 26-Jan-1981   Cancelled treatment:       Reason Eval/Treat Not Completed: Fatigue/lethargy limiting ability to participate. RN reported pt was more alert today, however pt was soundly sleeping upon SLP arrival this afternoon and did not wake to loud voice or tactile stimulation. Of note, pt recently completed session with PT/OT and likely has increased fatigue following session. SLP will continue to follow.    Rolinda Impson J Jamarquis Crull 02/22/2024, 3:30 PM

## 2024-02-22 NOTE — Progress Notes (Signed)
 Occupational Therapy Treatment Patient Details Name: [name] [name] MRN: [id number] DOB: May 01, 1981 Today's Date: 02/22/2024   History of present illness [name] [name] is a 26 female who presented to the ED 6/11 with n/v, abd pain after HD. 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 CRRT 6/13- 6/15. 6/12 EVD placed. 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 Pt is making steady progress towards their acute OT goals. Session completed with PT to safely address functional mobiliyt. Pt was more alert and interactive this date. She showed some awareness into her deficits and followed most simple one step commands, pt does well with a non-distracting environment due to limited attention. Overall she required mod-max A for bed mobility and CGA-max A for sitting balance. She tolerated standing 2x with mod A +2. Pt reported visual changes however vision assessment was difficult due to impaired cognition. Dysconjugate gaze noted intermittently throughout. OT to continue to follow acutely to facilitate progress towards established goals. Pt will continue to benefit from intensive inpatient follow up therapy, >3 hours/day after discharge.  HR sustained 119-120s throughout the session.       If plan is discharge home, recommend the following:  A lot of help with walking and/or transfers;Two people to help with walking and/or transfers;A lot of help with bathing/dressing/bathroom;Two people to help with bathing/dressing/bathroom;Assistance with cooking/housework;Assistance with feeding;Direct supervision/assist for medications management;Direct supervision/assist for financial management;Assist for transportation;Supervision due to cognitive status;Help with stairs or ramp for entrance   Equipment Recommendations  Other (comment)     Recommendations for Other Services Rehab consult    Precautions / Restrictions Precautions Precautions: Fall Recall of Precautions/Restrictions: Impaired Precaution/Restrictions Comments: EVD (RN clamped before session and unclamped after session), cortrak, RIJ, flexiseal Restrictions Weight Bearing Restrictions Per Provider Order: No       Mobility Bed Mobility Overal bed mobility: Needs Assistance Bed Mobility: Supine to Sit, Sit to Supine   Sidelying to sit: Mod assist, +2 for physical assistance, +2 for safety/equipment   Sit to supine: Max assist   General bed mobility comments: assist for trunk and LE management, scooting to and from EOB with use of bed pad. Once sitting EOB pt requiring CGA-mod assist to maintain upright sitting, pt with tendency towards R lateral lean.    Transfers Overall transfer level: Needs assistance Equipment used: 2 person hand held assist Transfers: Sit to/from Stand Sit to Stand: Mod assist, +2 physical assistance           General transfer comment: assist for power up, rise, steady, initially bilat knee blocking but lessened support with repeated attempts. Stand at EOB x2, VSS with HR 118-120s throughout session.     Balance Overall balance assessment: Needs assistance Sitting-balance support: Feet supported Sitting balance-Leahy Scale: Poor Sitting balance - Comments: periods of CGA only, but mostly requires min-mod truncal assist with R lateral bias. Tolerates L and R elbow propping, AP leaning, upright sitting x10 minutes Postural control: Right lateral lean Standing balance support: No upper extremity supported Standing balance-Leahy Scale: Fair                             ADL either performed or assessed with clinical judgement   ADL Overall ADL's : Needs assistance/impaired  Extremity/Trunk Assessment Upper Extremity Assessment Upper Extremity  Assessment: LUE deficits/detail;RUE deficits/detail RUE Deficits / Details: pt able to reach the top of her head with RUE LUE Deficits / Details: weaker than R, minimal active movement distally LUE Coordination: decreased gross motor;decreased fine motor   Lower Extremity Assessment Lower Extremity Assessment: Defer to PT evaluation        Vision   Vision Assessment?: Yes;Vision impaired- to be further tested in functional context Eye Alignment: Impaired (comment) Ocular Range of Motion: Impaired-to be further tested in functional context Tracking/Visual Pursuits: Impaired - to be further tested in functional context Convergence: Impaired - to be further tested in functional context Additional Comments: vision difficult to assess due to impaired cognition. Dysconjugate gaze noted. L eye with nystagmus at end range. pt made comment its hard to see out of my left eye. attempted assessment with unilateral occlusion but unsuccessful with attempts to have pt track to state numbers   Perception Perception Perception: Not tested   Praxis Praxis Praxis: Not tested   Communication Communication Communication: Impaired Factors Affecting Communication: Reduced clarity of speech   Cognition Arousal: Lethargic Behavior During Therapy: Flat affect Cognition: Cognition impaired   Orientation impairments: Situation Awareness: Intellectual awareness impaired Memory impairment (select all impairments): Working memory Attention impairment (select first level of impairment): Focused attention Executive functioning impairment (select all impairments): Initiation, Organization, Sequencing, Reasoning, Problem solving OT - Cognition Comments: pt more alert this date, she was able to follow most simple 1 step commands. She is highly distracable and needs constant re-direction.                 Following commands: Impaired Following commands impaired: Follows one step commands inconsistently,  Follows one step commands with increased time      Cueing   Cueing Techniques: Verbal cues  Exercises      Shoulder Instructions       General Comments VSS, EVD clammed prior to session.    Pertinent Vitals/ Pain       Pain Assessment Pain Assessment: Faces Faces Pain Scale: Hurts a little bit Pain Location: posterior LE, with full knee extension bilat Pain Descriptors / Indicators: Guarding, Grimacing Pain Intervention(s): Limited activity within patient's tolerance   Frequency  Min 2X/week        Progress Toward Goals  OT Goals(current goals can now be found in the care plan section)  Progress towards OT goals: Progressing toward goals  Acute Rehab OT Goals Patient Stated Goal: to get better OT Goal Formulation: With patient Time For Goal Achievement: 03/05/24 Potential to Achieve Goals: Good ADL Goals Pt Will Perform Grooming: with min assist Pt Will Perform Upper Body Dressing: with min assist Pt Will Transfer to Toilet: with mod assist;stand pivot transfer;bedside commode Additional ADL Goal #1: Pt will complete bed mobility with mod A as a precursor to ADLs  Plan      Co-evaluation    PT/OT/SLP Co-Evaluation/Treatment: Yes Reason for Co-Treatment: For patient/therapist safety;To address functional/ADL transfers PT goals addressed during session: Mobility/safety with mobility;Balance OT goals addressed during session: ADL's and self-care      AM-PAC OT 6 Clicks Daily Activity     Outcome Measure   Help from another person eating meals?: Total Help from another person taking care of personal grooming?: A Lot Help from another person toileting, which includes using toliet, bedpan, or urinal?: Total Help from another person bathing (including washing, rinsing, drying)?: A Lot Help from another person to put on and taking  off regular upper body clothing?: A Lot Help from another person to put on and taking off regular lower body clothing?: Total 6  Click Score: 9    End of Session Equipment Utilized During Treatment: Gait belt  OT Visit Diagnosis: Unsteadiness on feet (R26.81);Other abnormalities of gait and mobility (R26.89);Muscle weakness (generalized) (M62.81);Hemiplegia and hemiparesis Hemiplegia - Right/Left: Right   Activity Tolerance Patient tolerated treatment well   Patient Left in bed;with bed alarm set;with call bell/phone within reach   Nurse Communication Mobility status        Time: 1610-9604 OT Time Calculation (min): 28 min  Charges: OT General Charges $OT Visit: 1 Visit OT Treatments $Therapeutic Activity: 8-22 mins  Veryl Gottron, OTR/L Acute Rehabilitation Services Office [phone] Secure Chat Communication Preferred   Starla Easterly 02/22/2024, 2:25 PM

## 2024-02-22 NOTE — Plan of Care (Signed)
  Problem: Clinical Measurements: Goal: Will remain free from infection Outcome: Progressing Goal: Diagnostic test results will improve Outcome: Progressing Goal: Respiratory complications will improve Outcome: Progressing   Problem: Nutrition: Goal: Adequate nutrition will be maintained Outcome: Progressing   Problem: Coping: Goal: Level of anxiety will decrease Outcome: Progressing   Problem: Activity: Goal: Risk for activity intolerance will decrease Outcome: Not Progressing

## 2024-02-22 NOTE — Plan of Care (Signed)
  Problem: Education: Goal: Knowledge of disease or condition will improve Outcome: Progressing Goal: Knowledge of secondary prevention will improve (MUST DOCUMENT ALL) Outcome: Progressing Goal: Knowledge of patient specific risk factors will improve (DELETE if not current risk factor) Outcome: Progressing   Problem: Intracerebral Hemorrhage Tissue Perfusion: Goal: Complications of Intracerebral Hemorrhage will be minimized Outcome: Progressing   Problem: Coping: Goal: Will identify appropriate support needs Outcome: Progressing   Problem: Health Behavior/Discharge Planning: Goal: Goals will be collaboratively established with patient/family Outcome: Progressing   Problem: Self-Care: Goal: Ability to participate in self-care as condition permits will improve Outcome: Progressing Goal: Verbalization of feelings and concerns over difficulty with self-care will improve Outcome: Progressing Goal: Ability to communicate needs accurately will improve Outcome: Progressing   Problem: Nutrition: Goal: Risk of aspiration will decrease Outcome: Progressing

## 2024-02-22 NOTE — Progress Notes (Signed)
 STROKE TEAM PROGRESS NOTE    SIGNIFICANT HOSPITAL EVENTS 6/11- ED for nausea, vomiting after dialysis 6/12- intubated, EVD placed   INTERIM HISTORY/SUBJECTIVE  Patient is doing better today.  She has been arousable and following commands as per the nurse.  On exam this morning, she remains lethargic but does localize to pain with continued noxious stimuli and follows occasional midline commands.  She is on mild induced hyper tension and hypervolemia   No family available at the bedside.  Blood pressure adequately controlled without need for gtts.  OBJECTIVE  CBC    Component Value Date/Time   WBC 7.5 02/22/2024 0510   RBC 3.33 (L) 02/22/2024 0510   HGB 10.3 (L) 02/22/2024 0510   HCT 30.8 (L) 02/22/2024 0510   PLT 172 02/22/2024 0510   MCV 92.5 02/22/2024 0510   MCH 30.9 02/22/2024 0510   MCHC 33.4 02/22/2024 0510   RDW 15.2 02/22/2024 0510   LYMPHSABS 1.2 02/20/2024 1125   MONOABS 0.7 02/20/2024 1125   EOSABS 0.0 02/20/2024 1125   BASOSABS 0.0 02/20/2024 1125    BMET    Component Value Date/Time   NA 133 (L) 02/22/2024 0510   K 5.7 (H) 02/22/2024 0510   CL 87 (L) 02/22/2024 0510   CO2 17 (L) 02/22/2024 0510   GLUCOSE 138 (H) 02/22/2024 0510   BUN 102 (H) 02/22/2024 0510   CREATININE 6.74 (H) 02/22/2024 0510   CALCIUM  11.2 (H) 02/22/2024 0510   GFRNONAA 7 (L) 02/22/2024 0510    IMAGING past 24 hours No results found.    Vitals:   02/22/24 0930 02/22/24 0945 02/22/24 1000 02/22/24 1200  BP: (!) 152/64 (!) 160/67 (!) 147/65   Pulse: (!) 101 (!) 102 (!) 102   Resp: (!) 30 (!) 29 (!) 27   Temp:    99.3 F (37.4 C)  TempSrc:    Axillary  SpO2: 95% 97% 95%   Weight:      Height:         PHYSICAL EXAM General: Lethargic, NAD CV: ST, likely due to fever Respiratory: room air, unlabored  NEURO:  Lethargic,eyes closed, slightly opens eyes to repeated noxious stimuli gag and cough present.  Tongue protrusion not cooperative.  With deep sternal rub,  localizes with right side.   LUE drift and unable to lift LLE off the bed.  Sensation, coordination and gait not tested.   ASSESSMENT/PLAN  [name] is a 43 y.o. female with history of hypertension, ESRD on HD, s/p renal and pancreatic transplant, diabetes mellitus type 1 with gastroparesis presents with nausea and vomiting since after dialysis.  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 Intraventricular blood products layering in the occipital horns.  CTA head & neck - No large vessel occlusion. No high-grade stenosis, aneurysm, or dissection of the arteries in the head and neck. Mild stenosis of the V4 segments of both vertebral arteries. Mild stenosis of the right supraclinoid ICA.  6/12 repeat CT Head- Interval placement of a right frontal approach ventriculostomy with tip in the body of the right lateral ventricle. Persistent but relatively stable hydrocephalus at this time. Moderate volume acute subarachnoid hemorrhage with associated IVH, similar to prior. No evidence for significant interval bleeding since prior. MRI  Small acute anterior corpus callosum infarct Mildly improved hydrocephalus with EVD in place Decreased perimesencephalic subarachnoid hemorrhage  and IVH Trace supratentorial and infratentorial subdural hemorrhage Age advanced chronic small vessel ischemic disease with chronic lacunar infarcts Repeat CT 6/18: Stable multicompartment acute hemorrhage.  No progressive mass effect or new acute hemorrhage.  Stable ventricular size with right frontal approach. 2D Echo EF 60-65%, LA dilated  Will likely recommend 30-day monitor or LR on discharge Recommend cerebral angiogram once stable to rule out prepontine vascular anomaly On hold per NS, pending medically stable, afebrile LDL 63 HgbA1c  4.2 VTE prophylaxis - SCDs No antithrombotic prior to admission, continue No antithrombotic  Therapy recommendations:  Pending Disposition:  Pending   Cerebral edema with brain compression Hydrocephalous s/p EVD CT head Increased caliber of the ventricles concerning for developing communicating hydrocephalus.  NSGY consulted EVD placement 6/12, continued per NS Keppra  discontinued 6/17    Vasospasm with left cerebellar infarct CTA head and neck Mild irregularity of the basilar artery without significant narrowing. Finding concerning for mild vasospasm. TCD monitoring  6/16: No definite evidence of vasospasm noted in identified blood vessels.  6/18: elevated mean flow velocities in right middle cerebral artery suggestive  of moderate and left middle cerebral artery suggestive of mild  vasospasm.  6/19: PENDING Continue nimodipine    Respiratory failure  Aspiration pneumonia PCCM primary Intubated 6/12, extubated 6/14 Continued on Zosyn  for aspiration and colitis, 8-day course  Hypertensive Emergency  Home meds:  Norvasc , coreg , clonidine  patch, hydralazine   Unstable Cleviprex , labetalol  PRN BP goal < 160 Long term BP goal normotensive  Diabetes Type 1 Hx of pancreas transplant  HgbA1c 4.2, goal < 7.0 CBGs, SSI  ESRD on HD Hx of renal transplant  HD patient with fistula Nephrology on board CRRT --> HD treatments now   Dysphagia NG tube in place Speech therapy consult Nutrition consult for tube feeds  Other medical issues DM gastroparesis  Colitis  Continued on Zosyn  Thrombocytopenia, improved - platelet [phone] Additional Labs sent B12, Folte, RPR, TSH, Ammonia  Hospital day # 8  Pt seen by Neuro NP/APP with MD. Note/plan to be edited by MD as needed.     Patient mental status mental status on exam is much improved since he was started on [facility] therapy with gentle induced hypertension and hypervolemia.  Continue amlodipine .  Dr. Nat Badger to decide  on timing of cerebral catheter angiogram and possible endovascular treatment of vasospasm if needed.  No family available at the bedside. No family available at the bedside.  Discussed with Dr. Diania Fortes critical care medicine   This patient is critically ill and at significant risk of neurological worsening, death and care requires constant monitoring of vital signs, hemodynamics,respiratory and cardiac monitoring, extensive review of multiple databases, frequent neurological assessment, discussion with family, other specialists and medical decision making of high complexity.I have made any additions or clarifications directly to the above note.This critical care time does not reflect procedure time, or teaching time or supervisory time of PA/NP/Med Resident etc but could involve care discussion time.  I spent 30 minutes of neurocritical care time  in the care of  this patient.     Ardella Beaver, MD Medical Director [facility] Pager: [phone] 02/22/2024 12:57 PM

## 2024-02-22 NOTE — Progress Notes (Addendum)
 eLink Physician-Brief Progress Note Patient Name: [name] [name] DOB: 03-26-1981 MRN: [id number]   Date of Service  02/22/2024  HPI/Events of Note  Patient complaining of head/neck pain rated 5-6/10. They had been holding pain meds due to mentation but per bedside RN patient is wide awake and A/Ox4. Also, HD unable to [employer].  Discussed with RN.  Camera: Mental status stable on mittens. Received toradol  last night.  No HD tonight.  VS stable.   eICU Interventions  Get stat BMP for hyperkalemia, since not getting scheduled HD tonight. Hg 10. Toradol  IV once.  Pepcid  daily once via core track.  Avoiding opiates.      Intervention Category Intermediate Interventions: Pain - evaluation and management  Jodelle ONEIDA Hutching 02/22/2024, 11:40 PM  01:12 Bedside RN states that patient received Tramadol  last night for pain as opposed to Toradol . Stick for toradol  for tonight. No opiates or tramadol .   Also, K+ = 6.0. Asking if we wanted to give Lokelma  or something to help bring it down since she is having some ST elevation on monitor.  Has flexiseal. Abdomen is soft. No pain. No active colitis.  - Hyperkalemia protocol ordered. Has core track.   03:45 Patient is having a hard time keeping arm with fistula still so that they can complete dialysis for her, he is asking if we can order RUE Wrist restraint just long enough to complete the treatment?  - ordered  06:33 Improved cr and potassium down to 3.7 Continue care

## 2024-02-22 NOTE — Progress Notes (Cosign Needed)
 Providing Compassionate, Quality Care - Together   Subjective: Patient somnolent, but will awaken.  Objective: Vital signs in last 24 hours: Temp:  [98.1 F (36.7 C)-101.1 F (38.4 C)] 99.3 F (37.4 C) (06/20 1200) Pulse Rate:  [98-123] 102 (06/20 1000) Resp:  [17-32] 27 (06/20 1000) BP: (118-184)/(58-81) 147/65 (06/20 1000) SpO2:  [90 %-99 %] 95 % (06/20 1000) Weight:  [57.2 kg] 57.2 kg (06/20 0500)  Intake/Output from previous day: 06/19 0701 - 06/20 0700 In: 2039.7 [I.V.:170.5; NG/GT:1719.1; IV Piggyback:150.1] Out: 874 [Drains:189; Stool:685] Intake/Output this shift: Total I/O In: 185 [I.V.:20; NG/GT:165] Out: 72 [Drains:37; Stool:35]  Oriented to self and place Pupils 3 round, sluggish Follows commands MAE, slightly weaker on th left EVD in place, draining  Lab Results: Recent Labs    02/21/24 0845 02/22/24 0510  WBC 8.8 7.5  HGB 10.3* 10.3*  HCT 31.3* 30.8*  PLT 162 172   BMET Recent Labs    02/21/24 1710 02/22/24 0510  NA 132* 133*  K 5.4* 5.7*  CL 85* 87*  CO2 20* 17*  GLUCOSE 180* 138*  BUN 84* 102*  CREATININE 5.85* 6.74*  CALCIUM  10.9* 11.2*    Studies/Results: CT ANGIO HEAD W OR WO CONTRAST Addendum Date: 02/21/2024 ADDENDUM REPORT: 02/21/2024 13:05 ADDENDUM: Study reviewed in person with Dr. Ardella Beaver on 02/21/2024 at 13:05 . Electronically Signed   By: Marlise Simpers M.D.   On: 02/21/2024 13:05   Result Date: 02/21/2024 CLINICAL DATA:  43 year old female dialysis patient with acute intracranial hemorrhage on 02/14/2024. Subarachnoid and subdural blood. Right EVD. EXAM: CT ANGIOGRAPHY HEAD WITH AND WITHOUT CONTRAST TECHNIQUE: Multidetector CT imaging of the head and neck was performed using the standard protocol during bolus administration of intravenous contrast. Multiplanar CT image reconstructions and MIPs were obtained to evaluate the vascular anatomy. Carotid stenosis measurements (when applicable) are obtained utilizing NASCET criteria,  using the distal internal carotid diameter as the denominator. RADIATION DOSE REDUCTION: This exam was performed according to the departmental dose-optimization program which includes automated exposure control, adjustment of the mA and/or kV according to patient size and/or use of iterative reconstruction technique. CONTRAST:  75mL OMNIPAQUE  IOHEXOL  350 MG/ML SOLN COMPARISON:  Head CT yesterday. Admission CTA head and neck 02/14/2024. FINDINGS: CT HEAD Brain: Stable right superior frontal approach EVD. Stable ventricle size and configuration. Small volume residual IVH. No midline shift. Small volume residual posterior fossa intracranial hemorrhage, probably combined SAH and SDH. Stable small volume tentorial blood. 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 Supratentorial gray-white differentiation is stable. No supratentorial acute or evolving infarct identified. Calvarium and skull base: Stable right superior frontal burr hole. No acute osseous abnormality identified. Paranasal sinuses: Visualized paranasal sinuses and mastoids are stable and well aerated. Orbits: Left nasoenteric tube remains in place. Stable postoperative changes at the right vertex. Stable orbits. CTA HEAD Posterior circulation: Generalized distal vertebral artery calcified atherosclerosis. Both V3 and V4 segments appear patent. There is new moderate to severe stenosis of the distal left V4 (circumferential narrowing series 11, image 122), but remains patent to the vertebrobasilar junction. Distal right V4 segment appears stable. Right PICA origin remains patent. Left PICA origin occurs earlier, remains patent on series 11, image 145. Patent vertebrobasilar junction and basilar artery. Mild new mid basilar stenosis when compared to earlier this month (series 13, image 21). Stable SCA and PCA  origins. However,  PCA branches appear more diminutive and irregular, especially on the left (series 15, image 21) compared to earlier this month. But no PCA occlusion. Anterior circulation: Both ICA siphons are patent, heavily calcified as before. And there is similar new tapered appearance of the supraclinoid ICAs, carotid termini, MCA and ACA origins when compared to earlier this month (series 13, image 24). Diminutive but patent A1 and M1 segments now. Bilateral ACA and MCA branches similarly appear diffusely diminutive but remain patent (series 15, image 19 and series 14, image 18). Venous sinuses: Early contrast timing, not evaluated. Anatomic variants: Unchanged. Review of the MIP images confirms the above findings IMPRESSION: 1. Evolving Left cerebellar infarct since 02/19/2024. No hemorrhagic transformation or mass effect. 2. CTA reveals a substantially decreased caliber of multiple intracranial arteries and bilateral circle-of-Willis branches (especially: Distal left V4, supraclinoid ICAs, MCA and ACA origins) when compared to 02/14/2024. The appearance most resembles a widespread Vasospasm, Vasoconstriction. No associated large vessel occlusion. 3. Stable EVD, ventricle size, and small volume residual intracranial hemorrhage. Electronically Signed: By: Marlise Simpers M.D. On: 02/21/2024 13:00   VAS US  TRANSCRANIAL DOPPLER Result Date: 02/21/2024  Transcranial Doppler Patient Name:  [name] [name]  Date of Exam:   02/20/2024 Medical Rec #: [id number]           Accession #:    [id number] Date of Birth: 06-23-1981            Patient Gender: F Patient Age:   42 years Exam Location:  [facility] Procedure:      VAS US  TRANSCRANIAL DOPPLER Referring Phys: Fraser Jackson XU --------------------------------------------------------------------------------  Indications: Subarachnoid hemorrhage. History: Large ICH with brain compression and scattered SAH. History: ESRD, on dialysis. History of failed renal transplant.  History of pancreatic transplant.  Performing Technologist: Franky Ivanoff Sturdivant-Jones RDMS, RVT  Examination Guidelines: A complete evaluation includes B-mode imaging, spectral Doppler, color Doppler, and power Doppler as needed of all accessible portions of each vessel. Bilateral testing is considered an integral part of a complete examination. Limited examinations for reoccurring indications may be performed as noted.  +----------+---------------+----------+-----------+-------------+ RIGHT TCD Right VM (cm/s)Depth (cm)Pulsatility   Comment    +----------+---------------+----------+-----------+-------------+ MCA             130         5.10      1.11                  +----------+---------------+----------+-----------+-------------+ ACA             -61                   1.00                  +----------+---------------+----------+-----------+-------------+ Term ICA        40                    1.13                  +----------+---------------+----------+-----------+-------------+ PCA P1          54                    1.38                  +----------+---------------+----------+-----------+-------------+ Opthalmic       19                    1.63                  +----------+---------------+----------+-----------+-------------+  ICA siphon                                    NOT INSONATED +----------+---------------+----------+-----------+-------------+ Vertebral       -42                   1.16                  +----------+---------------+----------+-----------+-------------+ Distal ICA      33                    0.96                  +----------+---------------+----------+-----------+-------------+  +----------+--------------+----------+-----------+-------------+ LEFT TCD  Left VM (cm/s)Depth (cm)Pulsatility   Comment    +----------+--------------+----------+-----------+-------------+ MCA            110                   1.26                   +----------+--------------+----------+-----------+-------------+ ACA            -53                   1.13                  +----------+--------------+----------+-----------+-------------+ Term ICA        40                   1.34                  +----------+--------------+----------+-----------+-------------+ PCA P1          26                   1.36                  +----------+--------------+----------+-----------+-------------+ Opthalmic       19                   1.33                  +----------+--------------+----------+-----------+-------------+ ICA siphon                                   NOT INSONATED +----------+--------------+----------+-----------+-------------+ Vertebral      -14                   1.33                  +----------+--------------+----------+-----------+-------------+ Distal ICA      23                                         +----------+--------------+----------+-----------+-------------+  +------------+-------+-------------+             VM cm/s   Comment    +------------+-------+-------------+ Prox Basilar       NOT INSONATED +------------+-------+-------------+ Dist Basilar       NOT INSONATED +------------+-------+-------------+ +----------------------+---+ Right Lindegaard Ratio3.9 +----------------------+---+ +---------------------+---+ Left Lindegaard Ratio4.7 +---------------------+---+  Summary:  elevated mean flow velocities in right middle cerebral artery suggestive of moderate and left middle cerebral artery suggestive of mild vasospasm.Suboptimal  suboccipital window limits evaluation of posterior circulation. *See table(s) above for TCD measurements and observations.  Diagnosing physician: Ardella Beaver MD Electronically signed by Ardella Beaver MD on 02/21/2024 at 10:43:55 AM.    Final    DG Chest Port 1 View Result Date: 02/21/2024 CLINICAL DATA:  Pneumonia EXAM: PORTABLE CHEST 1 VIEW COMPARISON:  February 14, 2024  FINDINGS: Extubated. Feeding tube in the antrum of the stomach or first portion of the duodenal. Right IJ CVP line tip in the cavoatrial junction Persistent pulmonary infiltrates with interval improvement aeration of the lung may correlate with resolving pneumonia IMPRESSION: *Extubated. *Improving pneumonia. Electronically Signed   By: Fredrich Jefferson M.D.   On: 02/21/2024 07:14   CT HEAD WO CONTRAST ( ) Result Date: 02/20/2024 CLINICAL DATA:  Mental status change, unknown cause EXAM: CT HEAD WITHOUT CONTRAST TECHNIQUE: Contiguous axial images were obtained from the base of the skull through the vertex without intravenous contrast. RADIATION DOSE REDUCTION: This exam was performed according to the departmental dose-optimization program which includes automated exposure control, adjustment of the mA and/or kV according to patient size and/or use of iterative reconstruction technique. COMPARISON:  CT head June 17, 25. FINDINGS: Brain: Stable multi compartments acute hemorrhage including intraventricular hemorrhage layering in the occipital horns, multifocal infratentorial and supratentorial subarachnoid hemorrhage similar ventricular size with right frontal approach ventriculostomy catheter. No evidence of acute large vascular territory infarct, midline shift, or mass lesion. Vascular: No hyperdense vessel. Skull: No acute fracture. Sinuses/Orbits: Clear sinuses.  No acute orbital findings. Other: No mastoid effusions. IMPRESSION: 1. Stable multi compartment acute hemorrhage. No progressive mass effect or new/interval acute hemorrhage. 2. Stable ventricular size with right frontal approach ventriculostomy catheter. Electronically Signed   By: Stevenson Elbe M.D.   On: 02/20/2024 21:04    Assessment/Plan: Patient with SAH, IVH, hydrocephalus, vasospasm. EVD placed on 02/14/2024 by Dr. Larrie Po. Triple H therapy. Cerebral arteriogram will likely be performed early next week to rule out aneurysm.   LOS: 8 days     I am in communication with my attending and they agree with the plan for this patient.   Henreitta Locus, DNP, AGNP-C Nurse Practitioner  [facility] [address], [place], [name] [address] P: [phone]    F: [phone]  02/22/2024, 12:48 PM

## 2024-02-22 NOTE — Progress Notes (Signed)
 Physical Therapy Treatment Patient Details Name: [name] [name] MRN: [id number] DOB: 1980-11-24 Today's Date: 02/22/2024   History of Present Illness [name] [name] is a 56 female who presented to the ED 6/11 with n/v, abd pain after HD. 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 CRRT 6/13- 6/15. 6/12 EVD placed. 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 Pt initially lethargic, but wakes and follows one-step commands. Pt also with good recognition of familiar staff when they arrive to room, engages in simple conversation with PT/OT, and is demonstrating increasing awareness stating what is going on, why am I weak?. Pt requiring max +2 assist for to/from EOB, but once sitting EOB pt with periods of EOB sitting with contact guard support only. Pt tolerating x2 transitions into standing, requiring mod +2 with no buckling noted in stance. Pt endorsing fatigue after transfer trials. Plan remains appropriate.   HR 118-120s throughout session SBP 149-160 during session    If plan is discharge home, recommend the following: Assistance with cooking/housework;Assistance with feeding;Direct supervision/assist for medications management;Direct supervision/assist for financial management;Assist for transportation;Help with stairs or ramp for entrance;Supervision due to cognitive status;A lot of help with walking and/or transfers;A lot of help with bathing/dressing/bathroom   Can travel by private vehicle        Equipment Recommendations  Wheelchair cushion (measurements PT);Wheelchair (measurements PT);Hospital bed    Recommendations for Other Services       Precautions / Restrictions Precautions Precautions: Fall Recall of Precautions/Restrictions: Impaired Precaution/Restrictions Comments: EVD (RN clamped before session and  unclamped after session), cortrak, RIJ, flexiseal Restrictions Weight Bearing Restrictions Per Provider Order: No     Mobility  Bed Mobility Overal bed mobility: Needs Assistance Bed Mobility: Supine to Sit, Sit to Supine           General bed mobility comments: assist for trunk and LE management, scooting to and from EOB with use of bed pad. Once sitting EOB pt requiring CGA-mod assist to maintain upright sitting, pt with tendency towards R lateral lean.    Transfers Overall transfer level: Needs assistance Equipment used: 2 person hand held assist Transfers: Sit to/from Stand Sit to Stand: Mod assist, +2 physical assistance           General transfer comment: assist for power up, rise, steady, initially bilat knee blocking but lessened support with repeated attempts. Stand at EOB x2, VSS with HR 118-120s throughout session.    Ambulation/Gait                   Stairs             Wheelchair Mobility     Tilt Bed    Modified Rankin (Stroke Patients Only) Modified Rankin (Stroke Patients Only) Pre-Morbid Rankin Score: No symptoms Modified Rankin: Severe disability     Balance Overall balance assessment: Needs assistance Sitting-balance support: Feet supported Sitting balance-Leahy Scale: Poor Sitting balance - Comments: periods of CGA only, but mostly requires min-mod truncal assist with R lateral bias. Tolerates L and R elbow propping, AP leaning, upright sitting x10 minutes Postural control: Right lateral lean Standing balance support: No upper extremity supported Standing balance-Leahy Scale: Fair                              Musician Communication: Impaired  Cognition Arousal: Lethargic Behavior During Therapy: Flat affect   PT - Cognitive impairments: No family/caregiver present to determine baseline, Awareness, Attention, Initiation, Sequencing, Problem solving, Safety/Judgement                        PT - Cognition Comments: Pt speaking throughout session, following one-step commands consistently. Pt making statements repeatedly like what is happening vs what is going on, why am I weak. PT and OT explained hospital course. Pt's NP from HD clinic came to visit during session, pt recognizes and speaks warmly with her. At times needs repeated cues to attend to task Following commands: Impaired Following commands impaired: Follows one step commands inconsistently    Cueing Cueing Techniques: Verbal cues  Exercises      General Comments General comments (skin integrity, edema, etc.): dysconjugate gaze, states I can't see out of my left eye and is inconsistent on eye exam. Attempted covering 1 eye at a time and assessing each eye individually, but not successful suspect given pt attention      Pertinent Vitals/Pain Pain Assessment Pain Assessment: Faces Faces Pain Scale: Hurts a little bit Pain Location: posterior LE, with full knee extension bilat Pain Descriptors / Indicators: Guarding, Grimacing Pain Intervention(s): Limited activity within patient's tolerance, Monitored during session, Repositioned    Home Living                          Prior Function            PT Goals (current goals can now be found in the care plan section) Acute Rehab PT Goals PT Goal Formulation: With patient Time For Goal Achievement: 03/05/24 Potential to Achieve Goals: Good Progress towards PT goals: Progressing toward goals    Frequency    Min 3X/week      PT Plan      Co-evaluation PT/OT/SLP Co-Evaluation/Treatment: Yes Reason for Co-Treatment: For patient/therapist safety;To address functional/ADL transfers PT goals addressed during session: Mobility/safety with mobility;Balance        AM-PAC PT 6 Clicks Mobility   Outcome Measure  Help needed turning from your back to your side while in a flat bed without using bedrails?: A Lot Help needed moving from  lying on your back to sitting on the side of a flat bed without using bedrails?: A Lot Help needed moving to and from a bed to a chair (including a wheelchair)?: Total Help needed standing up from a chair using your arms (e.g., wheelchair or bedside chair)?: A Lot Help needed to walk in hospital room?: Total Help needed climbing 3-5 steps with a railing? : Total 6 Click Score: 9    End of Session   Activity Tolerance: Patient tolerated treatment well;Patient limited by fatigue Patient left: in bed;with call bell/phone within reach;with bed alarm set Nurse Communication: Mobility status PT Visit Diagnosis: Unsteadiness on feet (R26.81);Other abnormalities of gait and mobility (R26.89);Muscle weakness (generalized) (M62.81);Hemiplegia and hemiparesis Hemiplegia - Right/Left: Left Hemiplegia - dominant/non-dominant: Non-dominant Hemiplegia - caused by: Other Nontraumatic intracranial hemorrhage     Time: 1142-1210 PT Time Calculation (min) (ACUTE ONLY): 28 min  Charges:    $Neuromuscular Re-education: 8-22 mins PT General Charges $$ ACUTE PT VISIT: 1 Visit                     Shirlene Doughty, PT DPT Acute Rehabilitation Services Secure Chat Preferred  Office [phone]    Solita Macadam Cydney Draft  02/22/2024, 2:05 PM

## 2024-02-22 NOTE — Progress Notes (Signed)
 eLink Physician-Brief Progress Note Patient Name: [name] [name] DOB: 16-Jul-1981 MRN: [id number]   Date of Service  02/22/2024  HPI/Events of Note  43 year old female with subarachnoid hemorrhage, has a tendency towards encephalopathy with opiates.  Tylenol  at midnight with moderate effect  eICU Interventions  Increase frequency of Tylenol  to every 4 hours Tramadol  x 1        [name] [name] 02/22/2024, 3:43 AM

## 2024-02-22 NOTE — Progress Notes (Signed)
 Inpatient Rehabilitation Admissions Coordinator   I will place rehab consult to assess for candidacy for CIR admit.  Jeannetta Millman, RN, MSN Rehab Admissions Coordinator [phone] 02/22/2024 2:54 PM

## 2024-02-22 NOTE — Progress Notes (Signed)
 [name] Progress Note   Subjective: For HD again today.  TCDs showing evidence of vasospasm.    Objective Vitals:   02/22/24 0915 02/22/24 0930 02/22/24 0945 02/22/24 1000  BP: 139/65 (!) 152/64 (!) 160/67 (!) 147/65  Pulse: 100 (!) 101 (!) 102 (!) 102  Resp: (!) 29 (!) 30 (!) 29 (!) 27  Temp:      TempSrc:      SpO2: 96% 95% 97% 95%  Weight:      Height:       Physical Exam General: ill appearing, eyes closed but talking Heart: RRR Lungs: crhonchrous bilaterally Abdomen:soft Extremities: minimal edema Dialysis Access: temp femoral HD catheter, RUE AV access +t/b  Additional Objective Labs: Basic Metabolic Panel: Recent Labs  Lab 02/21/24 0458 02/21/24 1710 02/22/24 0510  NA 135 132* 133*  K 5.1 5.4* 5.7*  CL 87* 85* 87*  CO2 22 20* 17*  GLUCOSE 142* 180* 138*  BUN 60* 84* 102*  CREATININE 4.61* 5.85* 6.74*  CALCIUM  10.8* 10.9* 11.2*  PHOS 7.2* 8.7* 10.5*   Liver Function Tests: Recent Labs  Lab 02/21/24 0458 02/21/24 1710 02/22/24 0510  ALBUMIN 3.2* 3.3* 3.1*   No results for input(s): LIPASE, AMYLASE in the last 168 hours.  CBC: Recent Labs  Lab 02/16/24 0513 02/17/24 0928 02/19/24 1639 02/20/24 1125 02/20/24 2221 02/21/24 0845 02/22/24 0510  WBC 7.4 9.0  --  9.4  --  8.8 7.5  NEUTROABS  --   --   --  7.3  --   --   --   HGB 10.2* 11.4*   < > 11.3* 11.5* 10.3* 10.3*  HCT 32.2* 34.6*   < > 34.6* 34.7* 31.3* 30.8*  MCV 95.3 94.0  --  93.0  --  94.6 92.5  PLT 136* 156  --  180 179 162 172   < > = values in this interval not displayed.   Blood Culture    Component Value Date/Time   SDES BLOOD LEFT ARM 02/20/2024 1125   SPECREQUEST  02/20/2024 1125    BOTTLES DRAWN AEROBIC AND ANAEROBIC Blood Culture adequate volume   CULT  02/20/2024 1125    NO GROWTH 2 DAYS Performed at [facility], [address] [place], [name] [address]    REPTSTATUS PENDING 02/20/2024 1125    Cardiac Enzymes: No results for  input(s): CKTOTAL, CKMB, CKMBINDEX, TROPONINI in the last 168 hours. CBG: Recent Labs  Lab 02/21/24 1537 02/21/24 1923 02/21/24 2358 02/22/24 0430 02/22/24 0806  GLUCAP 197* 147* 135* 143* 148*   Iron  Studies: No results for input(s): IRON , TIBC, TRANSFERRIN, FERRITIN in the last 72 hours. @lablastinr3 @ Studies/Results: CT ANGIO HEAD W OR WO CONTRAST Addendum Date: 02/21/2024 ADDENDUM REPORT: 02/21/2024 13:05 ADDENDUM: Study reviewed in person with Dr. Ardella Beaver on 02/21/2024 at 13:05 . Electronically Signed   By: Marlise Simpers M.D.   On: 02/21/2024 13:05   Result Date: 02/21/2024 CLINICAL DATA:  43 year old female dialysis patient with acute intracranial hemorrhage on 02/14/2024. Subarachnoid and subdural blood. Right EVD. EXAM: CT ANGIOGRAPHY HEAD WITH AND WITHOUT CONTRAST TECHNIQUE: Multidetector CT imaging of the head and neck was performed using the standard protocol during bolus administration of intravenous contrast. Multiplanar CT image reconstructions and MIPs were obtained to evaluate the vascular anatomy. Carotid stenosis measurements (when applicable) are obtained utilizing NASCET criteria, using the distal internal carotid diameter as the denominator. RADIATION DOSE REDUCTION: This exam was performed according to the departmental dose-optimization program which includes  automated exposure control, adjustment of the mA and/or kV according to patient size and/or use of iterative reconstruction technique. CONTRAST:  75mL OMNIPAQUE  IOHEXOL  350 MG/ML SOLN COMPARISON:  Head CT yesterday. Admission CTA head and neck 02/14/2024. FINDINGS: CT HEAD Brain: Stable right superior frontal approach EVD. Stable ventricle size and configuration. Small volume residual IVH. No midline shift. Small volume residual posterior fossa intracranial hemorrhage, probably combined SAH and SDH. Stable small volume tentorial blood. However, patchy left cerebellar infarct, left PICA territory (series 4,  image 72) appears new since the CT on 02/19/2024, superimposed on smaller chronic left cerebellar tonsil infarct there. Supratentorial gray-white differentiation is stable. No supratentorial acute or evolving infarct identified. Calvarium and skull base: Stable right superior frontal burr hole. No acute osseous abnormality identified. Paranasal sinuses: Visualized paranasal sinuses and mastoids are stable and well aerated. Orbits: Left nasoenteric tube remains in place. Stable postoperative changes at the right vertex. Stable orbits. CTA HEAD Posterior circulation: Generalized distal vertebral artery calcified atherosclerosis. Both V3 and V4 segments appear patent. There is new moderate to severe stenosis of the distal left V4 (circumferential narrowing series 11, image 122), but remains patent to the vertebrobasilar junction. Distal right V4 segment appears stable. Right PICA origin remains patent. Left PICA origin occurs earlier, remains patent on series 11, image 145. Patent vertebrobasilar junction and basilar artery. Mild new mid basilar stenosis when compared to earlier this month (series 13, image 21). Stable SCA and PCA origins. However, PCA branches appear more diminutive and irregular, especially on the left (series 15, image 21) compared to earlier this month. But no PCA occlusion. Anterior circulation: Both ICA siphons are patent, heavily calcified as before. And there is similar new tapered appearance of the supraclinoid ICAs, carotid termini, MCA and ACA origins when compared to earlier this month (series 13, image 24). Diminutive but patent A1 and M1 segments now. Bilateral ACA and MCA branches similarly appear diffusely diminutive but remain patent (series 15, image 19 and series 14, image 18). Venous sinuses: Early contrast timing, not evaluated. Anatomic variants: Unchanged. Review of the MIP images confirms the above findings IMPRESSION: 1. Evolving Left cerebellar infarct since 02/19/2024. No  hemorrhagic transformation or mass effect. 2. CTA reveals a substantially decreased caliber of multiple intracranial arteries and bilateral circle-of-Willis branches (especially: Distal left V4, supraclinoid ICAs, MCA and ACA origins) when compared to 02/14/2024. The appearance most resembles a widespread Vasospasm, Vasoconstriction. No associated large vessel occlusion. 3. Stable EVD, ventricle size, and small volume residual intracranial hemorrhage. Electronically Signed: By: Marlise Simpers M.D. On: 02/21/2024 13:00   VAS US  TRANSCRANIAL DOPPLER Result Date: 02/21/2024  Transcranial Doppler Patient Name:  [name] [name]  Date of Exam:   02/20/2024 Medical Rec #: [id number]           Accession #:    [id number] Date of Birth: [birth year]            Patient Gender: F Patient Age:   15 years Exam Location:  [facility] Procedure:      VAS US  TRANSCRANIAL DOPPLER Referring Phys: Fraser Jackson XU --------------------------------------------------------------------------------  Indications: Subarachnoid hemorrhage. History: Large ICH with brain compression and scattered SAH. 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  Performing Technologist: Franky Ivanoff Sturdivant-Jones RDMS, RVT  Examination Guidelines: A complete evaluation includes B-mode imaging, spectral Doppler, color Doppler, and power Doppler as needed of all accessible portions of each vessel. Bilateral testing is considered an integral part of a complete examination.  Limited examinations for reoccurring indications may be performed as noted.  +----------+---------------+----------+-----------+-------------+ RIGHT TCD Right VM (cm/s)Depth (cm)Pulsatility   Comment    +----------+---------------+----------+-----------+-------------+ MCA             130         5.10      1.11                  +----------+---------------+----------+-----------+-------------+ ACA             -61                   1.00                   +----------+---------------+----------+-----------+-------------+ Term ICA        40                    1.13                  +----------+---------------+----------+-----------+-------------+ PCA P1          54                    1.38                  +----------+---------------+----------+-----------+-------------+ Opthalmic       19                    1.63                  +----------+---------------+----------+-----------+-------------+ ICA siphon                                    NOT INSONATED +----------+---------------+----------+-----------+-------------+ Vertebral       -42                   1.16                  +----------+---------------+----------+-----------+-------------+ Distal ICA      33                    0.96                  +----------+---------------+----------+-----------+-------------+  +----------+--------------+----------+-----------+-------------+ LEFT TCD  Left VM (cm/s)Depth (cm)Pulsatility   Comment    +----------+--------------+----------+-----------+-------------+ MCA            110                   1.26                  +----------+--------------+----------+-----------+-------------+ ACA            -53                   1.13                  +----------+--------------+----------+-----------+-------------+ Term ICA        40                   1.34                  +----------+--------------+----------+-----------+-------------+ PCA P1          26                   1.36                  +----------+--------------+----------+-----------+-------------+  Opthalmic       19                   1.33                  +----------+--------------+----------+-----------+-------------+ ICA siphon                                   NOT INSONATED +----------+--------------+----------+-----------+-------------+ Vertebral      -14                   1.33                   +----------+--------------+----------+-----------+-------------+ Distal ICA      23                                         +----------+--------------+----------+-----------+-------------+  +------------+-------+-------------+             VM cm/s   Comment    +------------+-------+-------------+ Prox Basilar       NOT INSONATED +------------+-------+-------------+ Dist Basilar       NOT INSONATED +------------+-------+-------------+ +----------------------+---+ Right Lindegaard Ratio3.9 +----------------------+---+ +---------------------+---+ Left Lindegaard Ratio4.7 +---------------------+---+  Summary:  elevated mean flow velocities in right middle cerebral artery suggestive of moderate and left middle cerebral artery suggestive of mild vasospasm.Suboptimal suboccipital window limits evaluation of posterior circulation. *See table(s) above for TCD measurements and observations.  Diagnosing physician: Ardella Beaver MD Electronically signed by Ardella Beaver MD on 02/21/2024 at 10:43:55 AM.    Final    DG Chest Port 1 View Result Date: 02/21/2024 CLINICAL DATA:  Pneumonia EXAM: PORTABLE CHEST 1 VIEW COMPARISON:  February 14, 2024 FINDINGS: Extubated. Feeding tube in the antrum of the stomach or first portion of the duodenal. Right IJ CVP line tip in the cavoatrial junction Persistent pulmonary infiltrates with interval improvement aeration of the lung may correlate with resolving pneumonia IMPRESSION: *Extubated. *Improving pneumonia. Electronically Signed   By: Fredrich Jefferson M.D.   On: 02/21/2024 07:14   CT HEAD WO CONTRAST ( ) Result Date: 02/20/2024 CLINICAL DATA:  Mental status change, unknown cause EXAM: CT HEAD WITHOUT CONTRAST TECHNIQUE: Contiguous axial images were obtained from the base of the skull through the vertex without intravenous contrast. RADIATION DOSE REDUCTION: This exam was performed according to the departmental dose-optimization program which includes automated  exposure control, adjustment of the mA and/or kV according to patient size and/or use of iterative reconstruction technique. COMPARISON:  CT head June 17, 25. FINDINGS: Brain: Stable multi compartments acute hemorrhage including intraventricular hemorrhage layering in the occipital horns, multifocal infratentorial and supratentorial subarachnoid hemorrhage similar ventricular size with right frontal approach ventriculostomy catheter. No evidence of acute large vascular territory infarct, midline shift, or mass lesion. Vascular: No hyperdense vessel. Skull: No acute fracture. Sinuses/Orbits: Clear sinuses.  No acute orbital findings. Other: No mastoid effusions. IMPRESSION: 1. Stable multi compartment acute hemorrhage. No progressive mass effect or new/interval acute hemorrhage. 2. Stable ventricular size with right frontal approach ventriculostomy catheter. Electronically Signed   By: Stevenson Elbe M.D.   On: 02/20/2024 21:04    Medications:  feeding supplement (OSMOLITE 1.5 CAL) 55 mL/hr at 02/22/24 1000   norepinephrine (LEVOPHED) Adult infusion 4 mcg/min (02/22/24 1000)    acetaminophen   650 mg Per Tube Q4H   Or  acetaminophen   650 mg Rectal Q4H   aspirin   81 mg Per Tube Daily   Chlorhexidine  Gluconate Cloth  6 each Topical Daily   cycloSPORINE   25 mg Per Tube Daily   doxercalciferol   4 mcg Intravenous Q M,W,F-HD   fiber supplement (BANATROL TF)  60 mL Per Tube QID   heparin  injection (subcutaneous)  5,000 Units Subcutaneous Q8H   insulin  aspart  0-9 Units Subcutaneous Q4H   mycophenolate   500 mg Per Tube BID   niMODipine   60 mg Per Tube Q4H   mouth rinse  15 mL Mouth Rinse 4 times per day   predniSONE   15 mg Per Tube Q breakfast   sodium bicarbonate   1,300 mg Per Tube TID   sodium chloride  flush  3 mL Intravenous Q12H   sodium chloride  flush  3-10 mL Intravenous Q12H    Dialysis Orders: [facility] MWF 3:45 hrs 180NRe 400/500 57.2 kg 2.0 K/ 2.0 Ca AVF - No heparin  - Hectorol  4 mcg IV  three times per week - Mircera 150 mcg IV q 2 weeks (last dose 02/11/2024 next dose due 02/25/2024)  Assessment/Plan:  Altered mental status secondary to ICH complicated by hydrocephalus:  Has drain in now. Management per neuro/CCM.  MRI showed improvement of hydrocephalus and limiting sedating meds.   TCDs with evidence of vasospasm 02/21/24  Acute hypoxic respiratory failure- improved with UF.  Also on pip/tazo for aspiration.   ESRD - MWF HD typically, s/pCRRT since 6/12.  HD 6/16, 6/18, 6/20  Hypertension/volume  - Chronic difficult to manage HTN. Bp goals per CCM currently. Cleviprex  now off, on enteral meds.   Anemia  - HGB stable 10s. Recent ESA. Follow labs.   Metabolic bone disease -  Chronic noncompliance with binders. Phos currently in 3s. Resume binder when needed. On VDRA.   Nutrition - Albumin OK. Enteral nutrition currently.   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 Continues on immunosuppressant TX for pancreas.  Pharm provided liquid dosing equivalents with NGT.  Followed by [facility].  Sepsis: keeps spiking fevers- fem cath out.  Leandra Pro MD 02/22/2024, 11:42 AM  [facility]

## 2024-02-23 DIAGNOSIS — I63542 Cerebral infarction due to unspecified occlusion or stenosis of left cerebellar artery: Secondary | ICD-10-CM

## 2024-02-23 DIAGNOSIS — N186 End stage renal disease: Secondary | ICD-10-CM | POA: Diagnosis not present

## 2024-02-23 DIAGNOSIS — I739 Peripheral vascular disease, unspecified: Secondary | ICD-10-CM | POA: Diagnosis not present

## 2024-02-23 DIAGNOSIS — Z992 Dependence on renal dialysis: Secondary | ICD-10-CM | POA: Diagnosis not present

## 2024-02-23 DIAGNOSIS — I609 Nontraumatic subarachnoid hemorrhage, unspecified: Secondary | ICD-10-CM | POA: Diagnosis not present

## 2024-02-23 DIAGNOSIS — I615 Nontraumatic intracerebral hemorrhage, intraventricular: Secondary | ICD-10-CM | POA: Diagnosis not present

## 2024-02-23 DIAGNOSIS — R509 Fever, unspecified: Secondary | ICD-10-CM | POA: Diagnosis not present

## 2024-02-23 DIAGNOSIS — I959 Hypotension, unspecified: Secondary | ICD-10-CM

## 2024-02-23 LAB — RENAL FUNCTION PANEL
Albumin: 3.5 g/dL (ref 3.5–5.0)
Albumin: 3.4 g/dL — ABNORMAL LOW (ref 3.5–5.0)
Anion gap: 20 — ABNORMAL HIGH (ref 5–15)
Anion gap: 25 — ABNORMAL HIGH (ref 5–15)
BUN: 47 mg/dL — ABNORMAL HIGH (ref 6–20)
BUN: 53 mg/dL — ABNORMAL HIGH (ref 6–20)
CO2: 24 mmol/L (ref 22–32)
CO2: 24 mmol/L (ref 22–32)
Calcium: 10.4 mg/dL — ABNORMAL HIGH (ref 8.9–10.3)
Calcium: 10.5 mg/dL — ABNORMAL HIGH (ref 8.9–10.3)
Chloride: 90 mmol/L — ABNORMAL LOW (ref 98–111)
Chloride: 86 mmol/L — ABNORMAL LOW (ref 98–111)
Creatinine, Ser: 3.31 mg/dL — ABNORMAL HIGH (ref 0.44–1.00)
Creatinine, Ser: 4.13 mg/dL — ABNORMAL HIGH (ref 0.44–1.00)
GFR, Estimated: 17 mL/min — ABNORMAL LOW (ref >60)
GFR, Estimated: 13 mL/min — ABNORMAL LOW (ref >60)
Glucose, Bld: 151 mg/dL — ABNORMAL HIGH (ref 70–99)
Glucose, Bld: 127 mg/dL — ABNORMAL HIGH (ref 70–99)
Phosphorus: 4.7 mg/dL — ABNORMAL HIGH (ref 2.5–4.6)
Phosphorus: 7.3 mg/dL — ABNORMAL HIGH (ref 2.5–4.6)
Potassium: 3.7 mmol/L (ref 3.5–5.1)
Potassium: 5.9 mmol/L — ABNORMAL HIGH (ref 3.5–5.1)
Sodium: 134 mmol/L — ABNORMAL LOW (ref 135–145)
Sodium: 135 mmol/L (ref 135–145)

## 2024-02-23 LAB — PROCALCITONIN: Procalcitonin: 0.94 ng/mL

## 2024-02-23 LAB — BASIC METABOLIC PANEL WITH GFR
Anion gap: 35 — ABNORMAL HIGH (ref 5–15)
BUN: 128 mg/dL — ABNORMAL HIGH (ref 6–20)
CO2: 13 mmol/L — ABNORMAL LOW (ref 22–32)
Calcium: 11.9 mg/dL — ABNORMAL HIGH (ref 8.9–10.3)
Chloride: 85 mmol/L — ABNORMAL LOW (ref 98–111)
Creatinine, Ser: 8.22 mg/dL — ABNORMAL HIGH (ref 0.44–1.00)
GFR, Estimated: 6 mL/min — ABNORMAL LOW (ref >60)
Glucose, Bld: 142 mg/dL — ABNORMAL HIGH (ref 70–99)
Potassium: 6 mmol/L — ABNORMAL HIGH (ref 3.5–5.1)
Sodium: 133 mmol/L — ABNORMAL LOW (ref 135–145)

## 2024-02-23 LAB — POCT I-STAT 7, (LYTES, BLD GAS, ICA,H+H)
Acid-Base Excess: 4 mmol/L — ABNORMAL HIGH (ref 0.0–2.0)
Bicarbonate: 26.5 mmol/L (ref 20.0–28.0)
Calcium, Ion: 1.11 mmol/L — ABNORMAL LOW (ref 1.15–1.40)
HCT: 28 % — ABNORMAL LOW (ref 36.0–46.0)
Hemoglobin: 9.5 g/dL — ABNORMAL LOW (ref 12.0–15.0)
O2 Saturation: 95 %
Patient temperature: 100
Potassium: 4.7 mmol/L (ref 3.5–5.1)
Sodium: 130 mmol/L — ABNORMAL LOW (ref 135–145)
TCO2: 27 mmol/L (ref 22–32)
pCO2 arterial: 31.1 mmHg — ABNORMAL LOW (ref 32–48)
pH, Arterial: 7.541 — ABNORMAL HIGH (ref 7.35–7.45)
pO2, Arterial: 68 mmHg — ABNORMAL LOW (ref 83–108)

## 2024-02-23 LAB — PROTEIN AND GLUCOSE, CSF
Glucose, CSF: 103 mg/dL — ABNORMAL HIGH (ref 40–70)
Total  Protein, CSF: 95 mg/dL — ABNORMAL HIGH (ref 15–45)

## 2024-02-23 LAB — CSF CELL COUNT WITH DIFFERENTIAL

## 2024-02-23 LAB — MAGNESIUM: Magnesium: 2.5 mg/dL — ABNORMAL HIGH (ref 1.7–2.4)

## 2024-02-23 LAB — GLUCOSE, CAPILLARY
Glucose-Capillary: 111 mg/dL — ABNORMAL HIGH (ref 70–99)
Glucose-Capillary: 129 mg/dL — ABNORMAL HIGH (ref 70–99)
Glucose-Capillary: 138 mg/dL — ABNORMAL HIGH (ref 70–99)
Glucose-Capillary: 143 mg/dL — ABNORMAL HIGH (ref 70–99)
Glucose-Capillary: 147 mg/dL — ABNORMAL HIGH (ref 70–99)
Glucose-Capillary: 154 mg/dL — ABNORMAL HIGH (ref 70–99)
Glucose-Capillary: 166 mg/dL — ABNORMAL HIGH (ref 70–99)
Glucose-Capillary: 171 mg/dL — ABNORMAL HIGH (ref 70–99)
Glucose-Capillary: 183 mg/dL — ABNORMAL HIGH (ref 70–99)
Glucose-Capillary: 216 mg/dL — ABNORMAL HIGH (ref 70–99)
Glucose-Capillary: 229 mg/dL — ABNORMAL HIGH (ref 70–99)

## 2024-02-23 MED ORDER — VANCOMYCIN HCL 1250 MG/250ML IV SOLN
1250.0000 mg | Freq: Once | INTRAVENOUS | Status: AC
  Administered 2024-02-23: 1250 mg via INTRAVENOUS
  Filled 2024-02-23: qty 250

## 2024-02-23 MED ORDER — SODIUM CHLORIDE 0.9 % IV SOLN
1.0000 g | INTRAVENOUS | Status: DC
  Administered 2024-02-23 – 2024-02-25 (×3): 1 g via INTRAVENOUS
  Filled 2024-02-23 (×3): qty 20

## 2024-02-23 MED ORDER — SODIUM ZIRCONIUM CYCLOSILICATE 5 G PO PACK
5.0000 g | PACK | Freq: Once | ORAL | Status: AC
  Administered 2024-02-23: 5 g
  Filled 2024-02-23: qty 1

## 2024-02-23 MED ORDER — DOXERCALCIFEROL 4 MCG/2ML IV SOLN
INTRAVENOUS | Status: AC
Start: 2024-02-23 — End: 2024-02-23
  Filled 2024-02-23: qty 2

## 2024-02-23 MED ORDER — MIDODRINE HCL 5 MG PO TABS: 10.0000 mg | ORAL_TABLET | Freq: Three times a day (TID) | ORAL | Status: DC

## 2024-02-23 MED ORDER — INSULIN ASPART 100 UNIT/ML IV SOLN
5.0000 [IU] | Freq: Once | INTRAVENOUS | Status: AC
  Administered 2024-02-23: 5 [IU] via INTRAVENOUS

## 2024-02-23 MED ORDER — CALCIUM GLUCONATE-NACL 1-0.675 GM/50ML-% IV SOLN
1.0000 g | Freq: Once | INTRAVENOUS | Status: AC
  Administered 2024-02-23: 1000 mg via INTRAVENOUS
  Filled 2024-02-23: qty 50

## 2024-02-23 MED ORDER — DEXTROSE 50 % IV SOLN
1.0000 | Freq: Once | INTRAVENOUS | Status: AC
  Administered 2024-02-23: 50 mL via INTRAVENOUS
  Filled 2024-02-23: qty 50

## 2024-02-23 MED ORDER — MIDODRINE HCL 5 MG PO TABS
10.0000 mg | ORAL_TABLET | Freq: Three times a day (TID) | ORAL | Status: DC
  Administered 2024-02-23 (×2): 10 mg
  Filled 2024-02-23 (×4): qty 2

## 2024-02-23 NOTE — Progress Notes (Signed)
 NAME:  [name] [name], MRN:  [id number], DOB:  [birth year], LOS: 9 ADMISSION DATE:  02/13/2024, CONSULTATION DATE:  02/14/24 REFERRING MD:  Claudene, CHIEF COMPLAINT:  AMS    History of Present Illness:   43 yo F PMH ESRD on HD, HTN, failed renal txp,  pancreatic txp on mycophenolate  and pred 15, DM1 w gastroparesis who presented to ED 6/11 with n/v, abd pain. Hypertensive in ED SBP 240s. Went for CT a/p and was c/f some colitis. Started on abx, given IVF. C/o pain and was very agitated, rcvd pain meds. Admitted 6/12. Over the course of the day she became less responsive. Remained hypertensive w SBPs >200. Incr O2 req to [facility]. Pt was to rcv HD 6/12 afternoon but when eval by HD NP, concerns for airway protection. Rcvd narcan  with only modest improvement. PCCM consulted in this setting    Pertinent  Medical History  Failed renal / pancreas txp ESRD  HTN DM1 gastroparesis   Significant Hospital Events: Including procedures, antibiotic start and stop dates in addition to other pertinent events   6/11 ED w n/v AMS. Abx IVF for colitis 6/12 admit. AMS decr LOC. Incr O2 req. PCCM consult and code stroke called seen w rapid response nephro NP then neuro  6/13 intubated with EVD, responding to commands  6/14 extubated 6/18: Dialysis, fevering, EVD at 10 (129 mL last 24 hrs) 6/19 narcotic dependence a driving factor. + vasospasm on TCD CT angio 1. Evolving Left cerebellar infarct since 02/19/2024. No hemorrhagic transformation or mass effect. 2. CTA reveals a substantially decreased caliber of multiple intracranial arteries and bilateral circle-of-Willis branches (especially: Distal left V4, supraclinoid ICAs, MCA and ACA origins) when compared to 02/14/2024. The appearance most resembles a widespread Vasospasm, Vasoconstriction. No associated large vessel occlusion.  Interim History / Subjective:   No acute events overnight Increase in tachycardia, sinus Sleepy this morning, competed iHD,  1.3L removed.    Objective    Blood pressure 125/60, pulse (!) 126, temperature 98.4 F (36.9 C), temperature source Axillary, resp. rate (!) 27, height 5' 7.5 (1.715 m), weight 54.1 kg, last menstrual period 02/13/2024, SpO2 93%.        Intake/Output Summary (Last 24 hours) at 02/23/2024 1137 Last data filed at 02/23/2024 1000 Gross per 24 hour  Intake 1852.95 ml  Output 2617 ml  Net -764.05 ml   Filed Weights   02/22/24 0500 02/23/24 0313 02/23/24 0756  Weight: 57.2 kg 53.5 kg 54.1 kg    Examination: General no acute distress, sleeping in bed HENT EVD in place, IVC pink tinged CSF Neuro does not awake to verbal/tactile stimuli, spontaneously moving extremities, Pupils pinpoint Pulm clear dec bases Card rrr Abd soft Ext warm  Gu anuric   Resolved problem list   Hypertensive emergency  Acute respiratory failure w hypoxia Aspiration PNA (completed rx 6/19) Assessment and Plan   Large intracranial hemorrhage with brain compression, ICH score 4  Scattered SAH, Communicating hydrocephalus: EVD placement  Hx prior ischemic stroke w/ on-going decreased LOC Narcotics contributing to AMS and decreased LOC and as of 6/19 having evidence of vasospasm  plan NSGY following/managing EVD per neurosurgery Continue Nimodipine  Goal SBP 140-160 but not exceed 160 Need cerebral angiogram  Changed antihypertensives to PRN; also has PRN NE  PT/OT/Speech Continue to hold antithrombotic, SCDs  D/C'd PRN oxycodone   Scheduled apap Consider adding PRN tramadol  (got last night)   Ongoing fever Concern for neurogenic etiology vs infectious etiology Plan Schedule tylenol  650 mg Q6  Trend CBC Reasonable to send CSF fluid for culture  ESRD on HD  AGMA (persistent) etiology not clear  Hx failed renal txp Hypercalcemia plan Dialysis per nephrology Continue HCO3 tabs (inc d/t loss from diarrhea)  Daily RFP Daily CMP   Hypoptension plan Goal SBP 140-160 Continue levophed  Add  midodrine  10mg  TID  Colitis w/ on-going diarrhea zosyn  completed (6/19) Plan Rectal pouch Monitor for signs of bleeding Added immodium 6/19, use PRN Simethicone  PRN for pain. Inc fiber  Type 1 Diabetes S/P Pancreatic transplant on chronic immunosuppression Glycemic control acceptable plan Monitor blood glucose SSI  continue mycophenolate  and prednisone     Best Practice (right click and Reselect all SmartList Selections daily)   Diet/type: tubefeeds  DVT prophylaxis prophylactic heparin   and asa started 6/20 Pressure ulcer(s): pressure ulcer assessment deferred  GI prophylaxis: PPI Lines: N/A Foley:  N/A Code Status:  full code Last date of multidisciplinary goals of care discussion [friend updated 6/12. [place] ]   Critical care time: 39 min    Dorn Chill, MD [facility] Office: [phone]   See Amion for personal pager PCCM on call pager [phone] until 7pm. Please call Elink 7p-7a. [phone]

## 2024-02-23 NOTE — Progress Notes (Signed)
 [facility] Progress Note   Subjective:   Seen in ICU bed - just finished up with HD this AM, tolerated ok - 1.3L off. Resting quietly today.  Objective Vitals:   02/23/24 0700 02/23/24 0727 02/23/24 0756 02/23/24 0758  BP: (!) 140/71 138/78 (!) 167/69   Pulse: (!) 126 (!) 122 (!) 125   Resp: 18 (!) 24 (!) 24   Temp:    98.4 F (36.9 C)  TempSrc:    Axillary  SpO2: 98% 96% 95%   Weight:   54.1 kg   Height:       Physical Exam General: Ill appearing, resting, NAD. NG tube in place Heart: Tachycardic, no murmur Lungs: CTA anteriorly Abdomen: soft Extremities: 1+ BLE edema  Dialysis Access:  RUE AVF +t/b  Additional Objective Labs: Basic Metabolic Panel: Recent Labs  Lab 02/22/24 0510 02/22/24 1754 02/23/24 0000 02/23/24 0548  NA 133* 134* 133* 134*  K 5.7* 6.2* 6.0* 3.7  CL 87* 87* 85* 90*  CO2 17* 14* 13* 24  GLUCOSE 138* 143* 142* 151*  BUN 102* 121* 128* 47*  CREATININE 6.74* 7.69* 8.22* 3.31*  CALCIUM  11.2* 11.4* 11.9* 10.4*  PHOS 10.5* 10.8*  --  4.7*   Liver Function Tests: Recent Labs  Lab 02/22/24 0510 02/22/24 1754 02/23/24 0548  ALBUMIN 3.1* 2.9* 3.5   CBC: Recent Labs  Lab 02/17/24 0928 02/19/24 1639 02/20/24 1125 02/20/24 2221 02/21/24 0845 02/22/24 0510  WBC 9.0  --  9.4  --  8.8 7.5  NEUTROABS  --   --  7.3  --   --   --   HGB 11.4*   < > 11.3* 11.5* 10.3* 10.3*  HCT 34.6*   < > 34.6* 34.7* 31.3* 30.8*  MCV 94.0  --  93.0  --  94.6 92.5  PLT 156  --  180 179 162 172   < > = values in this interval not displayed.   Blood Culture    Component Value Date/Time   SDES BLOOD LEFT ARM 02/20/2024 1125   SPECREQUEST  02/20/2024 1125    BOTTLES DRAWN AEROBIC AND ANAEROBIC Blood Culture adequate volume   CULT  02/20/2024 1125    NO GROWTH 3 DAYS Performed at [facility], [address], [place], [name] [address]    REPTSTATUS PENDING 02/20/2024 1125   Studies/Results: VAS US  TRANSCRANIAL DOPPLER Result Date:  02/22/2024  Transcranial Doppler Patient Name:  LETITA PRENTISS  Date of Exam:   02/22/2024 Medical Rec #: [id number]           Accession #:    [id number] Date of Birth: [birth year]            Patient Gender: F Patient Age:   43 years Exam Location:  [facility] Procedure:      VAS US  TRANSCRANIAL DOPPLER Referring Phys: ARY XU --------------------------------------------------------------------------------  Indications: Subarachnoid hemorrhage. Comparison Study: Prior TCD done 6/18 Performing Technologist: Alberta Lis RVS  Examination Guidelines: A complete evaluation includes B-mode imaging, spectral Doppler, color Doppler, and power Doppler as needed of all accessible portions of each vessel. Bilateral testing is considered an integral part of a complete examination. Limited examinations for reoccurring indications may be performed as noted.  +----------+---------------+----------+-----------+------------------+ RIGHT TCD Right VM (cm/s)Depth (cm)Pulsatility     Comment       +----------+---------------+----------+-----------+------------------+ MCA             131  1.14                       +----------+---------------+----------+-----------+------------------+ ACA                                           Unable to insonate +----------+---------------+----------+-----------+------------------+ Term ICA        57                    1.14                       +----------+---------------+----------+-----------+------------------+ PCA P1          -32                   1.97                       +----------+---------------+----------+-----------+------------------+ Opthalmic       20                    1.45                       +----------+---------------+----------+-----------+------------------+ ICA siphon                                    Unable to insonate +----------+---------------+----------+-----------+------------------+ Vertebral                                      Unable to insonate +----------+---------------+----------+-----------+------------------+ Distal ICA      -15                   1.29                       +----------+---------------+----------+-----------+------------------+  +----------+--------------+----------+-----------+------------------+ LEFT TCD  Left VM (cm/s)Depth (cm)Pulsatility     Comment       +----------+--------------+----------+-----------+------------------+ MCA             24                   1.56                       +----------+--------------+----------+-----------+------------------+ ACA                                          Unable to insonate +----------+--------------+----------+-----------+------------------+ Term ICA        43                   1.27                       +----------+--------------+----------+-----------+------------------+ PCA P1         -27                    16                        +----------+--------------+----------+-----------+------------------+ Opthalmic       27  1.89                       +----------+--------------+----------+-----------+------------------+ ICA siphon                                   Unable to insonate +----------+--------------+----------+-----------+------------------+ Vertebral                                    Unable to insonate +----------+--------------+----------+-----------+------------------+ Distal ICA      17                    1.2                       +----------+--------------+----------+-----------+------------------+  +------------+-------+------------------+             VM cm/s     Comment       +------------+-------+------------------+ Prox Basilar       Unable to insonate +------------+-------+------------------+ +----------------------+---+ Right Lindegaard Ratio8.7 +----------------------+---+ +---------------------+---+ Left Lindegaard  Ratio1.4 +---------------------+---+    Preliminary    CT ANGIO HEAD W OR WO CONTRAST Addendum Date: 02/21/2024 ADDENDUM REPORT: 02/21/2024 13:05 ADDENDUM: Study reviewed in person with Dr. EATHER POPP on 02/21/2024 at 13:05 . Electronically Signed   By: VEAR Hurst M.D.   On: 02/21/2024 13:05   Result Date: 02/21/2024 CLINICAL DATA:  43 year old female dialysis patient with acute intracranial hemorrhage on 02/14/2024. Subarachnoid and subdural blood. Right EVD. EXAM: CT ANGIOGRAPHY HEAD WITH AND WITHOUT CONTRAST TECHNIQUE: Multidetector CT imaging of the head and neck was performed using the standard protocol during bolus administration of intravenous contrast. Multiplanar CT image reconstructions and MIPs were obtained to evaluate the vascular anatomy. Carotid stenosis measurements (when applicable) are obtained utilizing NASCET criteria, using the distal internal carotid diameter as the denominator. RADIATION DOSE REDUCTION: This exam was performed according to the departmental dose-optimization program which includes automated exposure control, adjustment of the mA and/or kV according to patient size and/or use of iterative reconstruction technique. CONTRAST:  75mL OMNIPAQUE  IOHEXOL  350 MG/ML SOLN COMPARISON:  Head CT yesterday. Admission CTA head and neck 02/14/2024. FINDINGS: CT HEAD Brain: Stable right superior frontal approach EVD. Stable ventricle size and configuration. Small volume residual IVH. No midline shift. Small volume residual posterior fossa intracranial hemorrhage, probably combined SAH and SDH. Stable small volume tentorial blood. 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 Supratentorial gray-white differentiation is stable. No supratentorial acute or evolving infarct identified. Calvarium and skull base: Stable right superior frontal burr hole.  No acute osseous abnormality identified. Paranasal sinuses: Visualized paranasal sinuses and mastoids are stable and well aerated. Orbits: Left nasoenteric tube remains in place. Stable postoperative changes at the right vertex. Stable orbits. CTA HEAD Posterior circulation: Generalized distal vertebral artery calcified atherosclerosis. Both V3 and V4 segments appear patent. There is new moderate to severe stenosis of the distal left V4 (circumferential narrowing series 11, image 122), but remains patent to the vertebrobasilar junction. Distal right V4 segment appears stable. Right PICA origin remains patent. Left PICA origin occurs earlier, remains patent on series 11, image 145. Patent vertebrobasilar junction and basilar artery. Mild new mid basilar stenosis when compared to earlier this month (series 13,  image 21). Stable SCA and PCA origins. However, PCA branches appear more diminutive and irregular, especially on the left (series 15, image 21) compared to earlier this month. But no PCA occlusion. Anterior circulation: Both ICA siphons are patent, heavily calcified as before. And there is similar new tapered appearance of the supraclinoid ICAs, carotid termini, MCA and ACA origins when compared to earlier this month (series 13, image 24). Diminutive but patent A1 and M1 segments now. Bilateral ACA and MCA branches similarly appear diffusely diminutive but remain patent (series 15, image 19 and series 14, image 18). Venous sinuses: Early contrast timing, not evaluated. Anatomic variants: Unchanged. Review of the MIP images confirms the above findings IMPRESSION: 1. Evolving Left cerebellar infarct since 02/19/2024. No hemorrhagic transformation or mass effect. 2. CTA reveals a substantially decreased caliber of multiple intracranial arteries and bilateral circle-of-Willis branches (especially: Distal left V4, supraclinoid ICAs, MCA and ACA origins) when compared to 02/14/2024. The appearance most resembles a  widespread Vasospasm, Vasoconstriction. No associated large vessel occlusion. 3. Stable EVD, ventricle size, and small volume residual intracranial hemorrhage. Electronically Signed: By: VEAR Hurst M.D. On: 02/21/2024 13:00   Medications:  feeding supplement (OSMOLITE 1.5 CAL) 55 mL/hr at 02/23/24 0700   norepinephrine  (LEVOPHED ) Adult infusion 3 mcg/min (02/23/24 0700)    acetaminophen   650 mg Per Tube Q4H   Or   acetaminophen   650 mg Rectal Q4H   aspirin   81 mg Per Tube Daily   Chlorhexidine  Gluconate Cloth  6 each Topical Daily   cycloSPORINE   25 mg Per Tube Daily   doxercalciferol   4 mcg Intravenous Q M,W,F-HD   famotidine   10 mg Per Tube QHS   fiber supplement (BANATROL TF)  60 mL Per Tube QID   heparin  injection (subcutaneous)  5,000 Units Subcutaneous Q8H   insulin  aspart  0-9 Units Subcutaneous Q4H   ketorolac   7.5 mg Intravenous Once   mycophenolate   500 mg Per Tube BID   niMODipine   60 mg Per Tube Q4H   mouth rinse  15 mL Mouth Rinse 4 times per day   predniSONE   15 mg Per Tube Q breakfast   sodium bicarbonate   1,300 mg Per Tube TID   sodium chloride  flush  3 mL Intravenous Q12H   sodium chloride  flush  3-10 mL Intravenous Q12H    Dialysis Orders MWF - AF 3:45hr, 400/500, EDW 57.2kg, 2K/2Ca, RUE AVF, no heparin  - Hectoral 4mcg IV q HD - Mircera 150mcg IV q 2 weeks (last given 6/9 -> due 6/23)  Assessment/Plan: Intracerebral hemorrhage: C/b hydrocephalus, 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 TCDs with evidence of vasospasm 02/11/24. Followed closely by neurosurgery. AHRF: Improved with HD, s/p course of Zosyn  for possible aspiration pneumonia. BP/volume: Variable, remains on pressors. 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 S/p CRRT 6/12-6/12, now back to iHD. BUN/CO2 much improved s/p HD this AM. Follow labs and reduce/stop bicarb. Next HD Monday. Hyperkalemia: Improved today s/p HD. 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 Waiting on ESA for now unless Hgb starts dropping. Secondary HPTH:Ca and Phos high, d/c VDRA for now. Nutrition: NG tube in place, on osmolite - will send message to nutritionist to change to Nepro given HyperK issues.   Izetta Boehringer, PA-C 02/23/2024, 8:39 AM  [employer]

## 2024-02-23 NOTE — Plan of Care (Incomplete)
 Neuro - drowsy sleepy lethargic but arousable. With eyes opening, she mumbling words but intangible. Not able to answer orientation questions and did not repeat or name. Not blinking to visual threat, but followed a couple of simple commands, such as smiling or close eyes. PERRL but bilateral eye abduction difficulty, left facial droop and left eye close incomplete. Tongue protrusion  not cooperative. Bilateral UEs 3/5 slow drift symmetric. Bilaterally LEs withdraw to pain 2+/5 seems symmetric. Sensation, coordination not cooperative and gait not tested.

## 2024-02-23 NOTE — Progress Notes (Signed)
 PCCM Update:  CSF fluid sent for cell count/differential, protein and cultures. CSF wbc returned at 244 with neutrophil predominance. Protein 95, glucose 103. 19,000 RBCs.   Given on going fevers and waxing/waning mental status, will empirically add on meropenem  and vancomycin . Will check fungitell and await cultures. Consider adding antifungal coverage.  Dorn Chill, MD [facility] Office: [phone]   See Amion for personal pager PCCM on call pager [phone] until 7pm. Please call Elink 7p-7a. [phone]

## 2024-02-23 NOTE — Progress Notes (Signed)
 Inpatient Rehab Admissions Coordinator:    CIR consult received. She's somnolent this morning and not able to discuss. I will try to reach out to family and follow for potential admit once medically stable.   Leita Kleine, MS, CCC-SLP Rehab Admissions Coordinator  [phone] (celll) [phone] (office)

## 2024-02-23 NOTE — Progress Notes (Signed)
 Pharmacy Antibiotic Note  [name] [name] is a 43 y.o. female admitted on 02/13/2024 with HTN emergency, acute respiratory failure w/ hypoxia and aspiration PNA.  Pharmacy has been consulted for vancomycin  and meropenem  dosing for possible CNS infection in the setting of ICH with brain compression.  Plan: Meropenem  1g IV q24h after HD Vancomycin  1250mg  IV x 1, then 500mg  after each HD - will f/u HD schedule and dose accordingly Follow up cultures as available, clinical progress, length of tx   Height: 5' 7.5 (171.5 cm) Weight: 54.1 kg (119 lb 4.3 oz) IBW/kg (Calculated) : 62.75  Temp (24hrs), Avg:99.7 F (37.6 C), Min:97.7 F (36.5 C), Max:102.5 F (39.2 C)  Recent Labs  Lab 02/17/24 0928 02/17/24 1637 02/18/24 0952 02/18/24 1718 02/20/24 1125 02/20/24 1632 02/21/24 0845 02/21/24 0846 02/21/24 1710 02/22/24 0510 02/22/24 1754 02/23/24 0000 02/23/24 0548 02/23/24 1546  WBC 9.0  --   --   --  9.4  --  8.8  --   --  7.5  --   --   --   --   CREATININE  --    < >  --    < >  --    < >  --   --    < > 6.74* 7.69* 8.22* 3.31* 4.13*  LATICACIDVEN  --   --  0.8  --   --   --   --  1.2  --   --   --   --   --   --    < > = values in this interval not displayed.    Estimated Creatinine Clearance: 15.2 mL/min (A) (by C-G formula based on SCr of 4.13 mg/dL (H)).    No Known Allergies  Antimicrobials this admission: Zosyn  6/12 >> 6/19 Vanc 6/21 >> Meropenem  6/21 >>  Dose adjustments this admission:  Microbiology results: 6/12 BCx - negative 6/12 MRSA PCR - negative 6/18 BCx - NGTD  Thank you for allowing pharmacy to be a part of this patient's care.  Rocky Slade, PharmD, BCPS 02/23/2024 4:40 PM

## 2024-02-23 NOTE — Progress Notes (Signed)
 Patient with significant fever, 102.  White count normal yesterday.  Patient somnolent but will awaken.  She is able to verbalize and follow commands.  Ventricular output is moderate.  Fluid is blood-tinged.  No evidence of turbidity or sediment.  CSF sent to the lab for analysis for possible ventriculitis.  Continue current management.  Depending on CSF studies patient may need to go back on antibiotics.

## 2024-02-23 NOTE — Procedures (Signed)
 Received patient in bed to unit.  Alert and oriented.  Informed consent signed and in chart.   TX duration: 3.5 hours  Patient tolerated well.  Resting quietly with OU closed,without acute distress.  Hand-off given to patient's nurse.   Access used: right arm fistula Access issues: none  Total UF removed: 1.3 liters Medication(s) given: hectorol    Greig Silvan, RN [facility]

## 2024-02-23 NOTE — Progress Notes (Addendum)
 STROKE TEAM PROGRESS NOTE    SIGNIFICANT HOSPITAL EVENTS 6/11- ED for nausea, vomiting after dialysis 6/12- intubated, EVD placed   INTERIM HISTORY/SUBJECTIVE  Patient is lethargic this morning.  She does open her eyes to voice, slowly follows commands intermittently.  Moves all extremities with generalized weakness.   No family at the bedside.  Blood pressure adequately controlled without need for gtts, on mild induced hypertension and hypervolemia  Continued fever without leukocytosis, negative procal, negative RPR, elevated Sed rate, negative blood cultures. Will discuss with CCM/NS about possibility of testing CSF from EVD for possible infection.   OBJECTIVE  CBC    Component Value Date/Time   WBC 7.5 02/22/2024 0510   RBC 3.33 (L) 02/22/2024 0510   HGB 10.3 (L) 02/22/2024 0510   HCT 30.8 (L) 02/22/2024 0510   PLT 172 02/22/2024 0510   MCV 92.5 02/22/2024 0510   MCH 30.9 02/22/2024 0510   MCHC 33.4 02/22/2024 0510   RDW 15.2 02/22/2024 0510   LYMPHSABS 1.2 02/20/2024 1125   MONOABS 0.7 02/20/2024 1125   EOSABS 0.0 02/20/2024 1125   BASOSABS 0.0 02/20/2024 1125    BMET    Component Value Date/Time   NA 134 (L) 02/23/2024 0548   K 3.7 02/23/2024 0548   CL 90 (L) 02/23/2024 0548   CO2 24 02/23/2024 0548   GLUCOSE 151 (H) 02/23/2024 0548   BUN 47 (H) 02/23/2024 0548   CREATININE 3.31 (H) 02/23/2024 0548   CALCIUM  10.4 (H) 02/23/2024 0548   GFRNONAA 17 (L) 02/23/2024 0548    IMAGING past 24 hours VAS US  TRANSCRANIAL DOPPLER Result Date: 02/22/2024  Transcranial Doppler Patient Name:  [name] [name]  Date of Exam:   02/22/2024 Medical Rec #: [id number]           Accession #:    [id number] Date of Birth: Sep 30, 1980            Patient Gender: F Patient Age:   43 years Exam Location:  [facility] Procedure:      VAS US  TRANSCRANIAL DOPPLER Referring Phys: ARY Yannet Rincon --------------------------------------------------------------------------------  Indications:  Subarachnoid hemorrhage. Comparison Study: Prior TCD done 6/18 Performing Technologist: Alberta Lis RVS  Examination Guidelines: A complete evaluation includes B-mode imaging, spectral Doppler, color Doppler, and power Doppler as needed of all accessible portions of each vessel. Bilateral testing is considered an integral part of a complete examination. Limited examinations for reoccurring indications may be performed as noted.  +----------+---------------+----------+-----------+------------------+ RIGHT TCD Right VM (cm/s)Depth (cm)Pulsatility     Comment       +----------+---------------+----------+-----------+------------------+ MCA             131                   1.14                       +----------+---------------+----------+-----------+------------------+ ACA                                           Unable to insonate +----------+---------------+----------+-----------+------------------+ Term ICA        57                    1.14                       +----------+---------------+----------+-----------+------------------+ PCA P1          -  32                   1.97                       +----------+---------------+----------+-----------+------------------+ Opthalmic       20                    1.45                       +----------+---------------+----------+-----------+------------------+ ICA siphon                                    Unable to insonate +----------+---------------+----------+-----------+------------------+ Vertebral                                     Unable to insonate +----------+---------------+----------+-----------+------------------+ Distal ICA      -15                   1.29                       +----------+---------------+----------+-----------+------------------+  +----------+--------------+----------+-----------+------------------+ LEFT TCD  Left VM (cm/s)Depth (cm)Pulsatility     Comment        +----------+--------------+----------+-----------+------------------+ MCA             24                   1.56                       +----------+--------------+----------+-----------+------------------+ ACA                                          Unable to insonate +----------+--------------+----------+-----------+------------------+ Term ICA        43                   1.27                       +----------+--------------+----------+-----------+------------------+ PCA P1         -27                    16                        +----------+--------------+----------+-----------+------------------+ Opthalmic       27                   1.89                       +----------+--------------+----------+-----------+------------------+ ICA siphon                                   Unable to insonate +----------+--------------+----------+-----------+------------------+ Vertebral                                    Unable to insonate +----------+--------------+----------+-----------+------------------+ Distal ICA      17  1.2                       +----------+--------------+----------+-----------+------------------+  +------------+-------+------------------+             VM cm/s     Comment       +------------+-------+------------------+ Prox Basilar       Unable to insonate +------------+-------+------------------+ +----------------------+---+ Right Lindegaard Ratio8.7 +----------------------+---+ +---------------------+---+ Left Lindegaard Ratio1.4 +---------------------+---+    Preliminary       Vitals:   02/23/24 0945 02/23/24 1000 02/23/24 1015 02/23/24 1030  BP: (!) 149/67 (!) 153/69 (!) 154/67 (!) 158/69  Pulse: (!) 117 (!) 116 (!) 115 (!) 115  Resp: (!) 25 (!) 25 (!) 25 (!) 26  Temp:      TempSrc:      SpO2: 96% 96% 96% 96%  Weight:      Height:         PHYSICAL EXAM General: Lethargic, NAD CV: ST, 120s, likely due to  fever Respiratory: room air, unlabored  NEURO:  Lethargic, opens eyes to command. L eye is unable to open as much. Dysconjugate gaze with evidence of bilateral CN6 palsy (decreased outward movement bilaterally) and no blink to threat bilaterally.  Gag and cough present.  Tongue protrusion midline, weak protrusion With deep sternal rub, localizes. LUE drift. Able to move   Sensation, coordination and gait not tested.   ASSESSMENT/PLAN  [name] is a 43 y.o. female with history of hypertension, ESRD on HD, s/p renal and pancreatic transplant, diabetes mellitus type 1 with gastroparesis presents with nausea and vomiting since after dialysis.  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 Intraventricular blood products layering in the occipital horns.  CTA head & neck - No large vessel occlusion. No high-grade stenosis, aneurysm, or dissection of the arteries in the head and neck. Mild stenosis of the V4 segments of both vertebral arteries. Mild stenosis of the right supraclinoid ICA.  6/12 repeat CT Head- Interval placement of a right frontal approach ventriculostomy with tip in the body of the right lateral ventricle. Persistent but relatively stable hydrocephalus at this time. Moderate volume acute subarachnoid hemorrhage with associated IVH, similar to prior. No evidence for significant interval bleeding since prior. MRI  Small acute anterior corpus callosum infarct Mildly improved hydrocephalus with EVD in place Decreased perimesencephalic subarachnoid hemorrhage and IVH Trace supratentorial and infratentorial subdural hemorrhage Age advanced chronic small vessel ischemic disease with chronic lacunar infarcts Repeat CT 6/18: Stable multicompartment acute hemorrhage.  No progressive mass effect or new acute  hemorrhage.  Stable ventricular size with right frontal approach. 2D Echo EF 60-65% Recommend cerebral angiogram once stable to rule out prepontine vascular anomaly - On hold per NS, pending medically stable, afebrile LDL 63 HgbA1c 4.2 VTE prophylaxis - heparin  suq No antithrombotic prior to admission, continue No antithrombotic  Therapy recommendations:  CIR Disposition:  Pending   Vasospasm with left cerebellar infarct CTA head and neck 6/12 - mild irregularity of the BA without significant narrowing, concerning for mild vasospasm MRI 6/15 Small acute anterior corpus callosum infarct CTA head and neck 6/19 substantially decreased caliber of multiple intracranial arteries and bilateral circle-of-Willis branches (especially: Distal left V4, supraclinoid ICAs, MCA and ACA origins) when compared to 02/14/2024. The appearance most resembles a widespread Vasospasm, Vasoconstriction.  TCD monitoring  6/16: No  definite evidence of vasospasm noted in identified blood vessels.  6/18: elevated mean flow velocities in right middle cerebral artery suggestive  of moderate and left middle cerebral artery suggestive of mild vasospasm.  CT head 6/19 evolving left cerebellar infarct since 02/19/24 BP augmentation  NSG on board On nimodipine    Cerebral edema with brain compression Hydrocephalous s/p EVD CT head Increased caliber of the ventricles concerning for developing communicating hydrocephalus.  NSGY consulted EVD placement 6/12, continued per NS Keppra  discontinued 6/17  Fever in the setting of EVD and immunosuppressant use Fever since 6/15, Tmax 101.7--102.2--102.9--101.7--103.1--102.5--100 WBC 9.4--8.8--7.5 On cyclosporine , cellcept  and prednisone  CSF sampling from EVD with WBC 244 and RBC 19000 On meropenem  and vanco  Respiratory failure  Aspiration pneumonia PCCM primary Intubated 6/12, extubated 6/14 Continued on Zosyn  for aspiration and colitis, 8-day course--completed CXR 6/19:  Improving pneumonia  Hypertensive Emergency  Now with Mild Induced Hypertension Home meds:  Norvasc , coreg , clonidine  patch, hydralazine   Unstable Off Cleviprex , labetalol  PRN Now on levophed  and midodrine  BP goal 140-160 Continue nimodipine   Long term BP goal normotensive  Diabetes Type 1 Hx of pancreas transplant  Chronically on CellCept , cyclosporine  and prednisone  HgbA1c 4.2, goal < 7.0 CBGs, SSI  ESRD on HD Hx of renal transplant  HD patient with fistula Nephrology on board CRRT --> HD treatments now  Chronically on CellCept , cyclosporine  and prednisone   Dysphagia NG tube in place Speech therapy on board Tube feeds @ 55  Other medical issues DM gastroparesis  Colitis, completed curse of Zosyn  Thrombocytopenia, improved - platelet [phone] Additional Labs sent, B12, Folte, RPR, TSH, Ammonia--WNL  Hospital day # 9  Pt seen by Neuro NP/APP with MD. Note/plan to be edited by MD as needed.     Rocky JAYSON Likes, DNP, AGACNP-BC [name] Please use AMION for contact information & EPIC for messaging.  ATTENDING NOTE: I reviewed above note and agree with the assessment and plan. Pt was seen and examined.   No family at the bedside. Pt is drowsy sleepy lethargic but arousable. With eyes opening, she mumbling words but intangible. Not able to answer orientation questions and did not repeat or name. Not blinking to visual threat, but followed a couple of simple commands, such as smiling or close eyes. PERRL but bilateral eye abduction difficulty, left facial droop and left eye close incomplete. Tongue protrusion  not cooperative. Bilateral UEs 3/5 slow drift symmetric. Bilaterally LEs withdraw to pain 2+/5 seems symmetric. Sensation, coordination not cooperative and gait not tested.   Given multiple days of fever, CSF sampling done from EVD with WBC 244 and RBC 19000, concerning for ventriculitis, put on meropenum and vanco. CCM managing. Wean off  levophed  as able, BP augmentation and continue nimodipine , cerebral angio deferred. Continue TF and supportive care.   For detailed assessment and plan, please refer to above as I have made changes wherever appropriate.   Ary Cummins, MD PhD Stroke Neurology 02/23/2024 10:22 PM  This patient is critically ill due to [facility], IVH, vasospasm, fever, hypotension and at significant risk of neurological worsening, death form recurrent stroke, sepsis, setpic shock, hydrocephalus. This patient's care requires constant monitoring of vital signs, hemodynamics, respiratory and cardiac monitoring, review of multiple databases, neurological assessment, discussion with family, other specialists and medical decision making of high complexity. I spent 40 minutes of neurocritical care time in the care of this patient. I discussed with Dr. Kara CCM

## 2024-02-24 DIAGNOSIS — I739 Peripheral vascular disease, unspecified: Secondary | ICD-10-CM | POA: Diagnosis not present

## 2024-02-24 DIAGNOSIS — I609 Nontraumatic subarachnoid hemorrhage, unspecified: Secondary | ICD-10-CM | POA: Diagnosis not present

## 2024-02-24 DIAGNOSIS — R509 Fever, unspecified: Secondary | ICD-10-CM | POA: Diagnosis not present

## 2024-02-24 DIAGNOSIS — Z992 Dependence on renal dialysis: Secondary | ICD-10-CM | POA: Diagnosis not present

## 2024-02-24 DIAGNOSIS — N186 End stage renal disease: Secondary | ICD-10-CM | POA: Diagnosis not present

## 2024-02-24 DIAGNOSIS — I615 Nontraumatic intracerebral hemorrhage, intraventricular: Secondary | ICD-10-CM | POA: Diagnosis not present

## 2024-02-24 DIAGNOSIS — I63542 Cerebral infarction due to unspecified occlusion or stenosis of left cerebellar artery: Secondary | ICD-10-CM | POA: Diagnosis not present

## 2024-02-24 LAB — RENAL FUNCTION PANEL
Albumin: 2.9 g/dL — ABNORMAL LOW (ref 3.5–5.0)
Albumin: 3.1 g/dL — ABNORMAL LOW (ref 3.5–5.0)
Anion gap: 28 — ABNORMAL HIGH (ref 5–15)
Anion gap: 31 — ABNORMAL HIGH (ref 5–15)
BUN: 77 mg/dL — ABNORMAL HIGH (ref 6–20)
BUN: 91 mg/dL — ABNORMAL HIGH (ref 6–20)
CO2: 20 mmol/L — ABNORMAL LOW (ref 22–32)
CO2: 17 mmol/L — ABNORMAL LOW (ref 22–32)
Calcium: 10.9 mg/dL — ABNORMAL HIGH (ref 8.9–10.3)
Calcium: 10.6 mg/dL — ABNORMAL HIGH (ref 8.9–10.3)
Chloride: 86 mmol/L — ABNORMAL LOW (ref 98–111)
Chloride: 89 mmol/L — ABNORMAL LOW (ref 98–111)
Creatinine, Ser: 5.39 mg/dL — ABNORMAL HIGH (ref 0.44–1.00)
Creatinine, Ser: 6.11 mg/dL — ABNORMAL HIGH (ref 0.44–1.00)
GFR, Estimated: 10 mL/min — ABNORMAL LOW (ref >60)
GFR, Estimated: 8 mL/min — ABNORMAL LOW (ref >60)
Glucose, Bld: 122 mg/dL — ABNORMAL HIGH (ref 70–99)
Glucose, Bld: 134 mg/dL — ABNORMAL HIGH (ref 70–99)
Phosphorus: 11.8 mg/dL — ABNORMAL HIGH (ref 2.5–4.6)
Phosphorus: 9.6 mg/dL — ABNORMAL HIGH (ref 2.5–4.6)
Potassium: 5 mmol/L (ref 3.5–5.1)
Potassium: 5.8 mmol/L — ABNORMAL HIGH (ref 3.5–5.1)
Sodium: 134 mmol/L — ABNORMAL LOW (ref 135–145)
Sodium: 137 mmol/L (ref 135–145)

## 2024-02-24 LAB — GLUCOSE, CAPILLARY
Glucose-Capillary: 123 mg/dL — ABNORMAL HIGH (ref 70–99)
Glucose-Capillary: 125 mg/dL — ABNORMAL HIGH (ref 70–99)
Glucose-Capillary: 133 mg/dL — ABNORMAL HIGH (ref 70–99)
Glucose-Capillary: 135 mg/dL — ABNORMAL HIGH (ref 70–99)
Glucose-Capillary: 137 mg/dL — ABNORMAL HIGH (ref 70–99)
Glucose-Capillary: 142 mg/dL — ABNORMAL HIGH (ref 70–99)
Glucose-Capillary: 178 mg/dL — ABNORMAL HIGH (ref 70–99)

## 2024-02-24 LAB — CSF CELL COUNT WITH DIFFERENTIAL
Eosinophils, CSF: 0 % (ref 0–1)
Lymphs, CSF: 6 % — ABNORMAL LOW (ref 40–80)
Monocyte-Macrophage-Spinal Fluid: 7 % — ABNORMAL LOW (ref 15–45)
RBC Count, CSF: 19000 /mm3 — ABNORMAL HIGH
Segmented Neutrophils-CSF: 87 % — ABNORMAL HIGH (ref 0–6)
Tube #: 3
WBC, CSF: 244 /mm3 (ref 0–5)

## 2024-02-24 LAB — GRAM STAIN

## 2024-02-24 LAB — MAGNESIUM: Magnesium: 3 mg/dL — ABNORMAL HIGH (ref 1.7–2.4)

## 2024-02-24 MED ORDER — CHLORHEXIDINE GLUCONATE CLOTH 2 % EX PADS
6.0000 | MEDICATED_PAD | Freq: Every day | CUTANEOUS | Status: DC
  Administered 2024-02-25 – 2024-02-28 (×5): 6 via TOPICAL

## 2024-02-24 MED ORDER — DEXTROSE 50 % IV SOLN
25.0000 g | Freq: Once | INTRAVENOUS | Status: AC
  Administered 2024-02-24: 25 g via INTRAVENOUS
  Filled 2024-02-24: qty 50

## 2024-02-24 MED ORDER — DEXTROSE 50 % IV SOLN
INTRAVENOUS | Status: AC
  Filled 2024-02-24: qty 50

## 2024-02-24 MED ORDER — OXYCODONE HCL 5 MG/5ML PO SOLN: 5.0000 mg | Freq: Once | ORAL | Status: DC

## 2024-02-24 MED ORDER — INSULIN ASPART 100 UNIT/ML IV SOLN
5.0000 [IU] | Freq: Once | INTRAVENOUS | Status: AC
  Administered 2024-02-24: 5 [IU] via INTRAVENOUS

## 2024-02-24 MED ORDER — NEPRO/CARBSTEADY PO LIQD
1000.0000 mL | ORAL | Status: DC
  Administered 2024-02-24 – 2024-03-01 (×5): 1000 mL
  Filled 2024-02-24 (×5): qty 1000

## 2024-02-24 MED ORDER — TRAMADOL 5 MG/ML ORAL SUSPENSION
25.0000 mg | Freq: Once | ORAL | Status: DC
  Filled 2024-02-24: qty 5

## 2024-02-24 MED ORDER — RENA-VITE PO TABS
1.0000 | ORAL_TABLET | Freq: Every day | ORAL | Status: DC
  Administered 2024-02-24 – 2024-03-11 (×17): 1
  Filled 2024-02-24 (×19): qty 1

## 2024-02-24 MED ORDER — TRAMADOL HCL 50 MG PO TABS
25.0000 mg | ORAL_TABLET | Freq: Once | ORAL | Status: AC
  Administered 2024-02-24: 25 mg
  Filled 2024-02-24: qty 1

## 2024-02-24 MED ORDER — SEVELAMER CARBONATE 2.4 G PO PACK
2.4000 g | PACK | Freq: Three times a day (TID) | ORAL | Status: DC
  Administered 2024-02-24 – 2024-03-12 (×45): 2.4 g
  Filled 2024-02-24 (×58): qty 1

## 2024-02-24 MED ORDER — OXYCODONE HCL 5 MG PO TABS
5.0000 mg | ORAL_TABLET | Freq: Two times a day (BID) | ORAL | Status: DC
  Administered 2024-02-24: 5 mg via ORAL
  Filled 2024-02-24 (×2): qty 1

## 2024-02-24 NOTE — Progress Notes (Signed)
 [facility] Progress Note   Subjective:  Seen in room - opens eyes but not verbalizing today. CSF cultured 6/21, Vanc/Meropenem  added for cerebritis. Breathing comfortably.  Objective Vitals:   02/24/24 0702 02/24/24 0730 02/24/24 0756 02/24/24 0800  BP:  (!) 179/75  (!) 142/56  Pulse:  (!) 130  89  Resp:  (!) 37  18  Temp:   98.6 F (37 C)   TempSrc:   Axillary   SpO2:  99%  98%  Weight: 54.1 kg     Height:       Physical Exam General: Ill appearing, NAD. NG tube in place. Opens eyes but not speaking today. Heart: Tachycardic, 3/6 murmur Lungs: CTA anteriorly Abdomen: soft Extremities: Trace BLE edema  Dialysis Access:  RUE AVF +t/b  Additional Objective Labs: Basic Metabolic Panel: Recent Labs  Lab 02/23/24 0548 02/23/24 1546 02/23/24 1630 02/24/24 0608  NA 134* 135 130* 134*  K 3.7 5.9* 4.7 5.0  CL 90* 86*  --  86*  CO2 24 24  --  20*  GLUCOSE 151* 127*  --  122*  BUN 47* 53*  --  77*  CREATININE 3.31* 4.13*  --  5.39*  CALCIUM  10.4* 10.5*  --  10.9*  PHOS 4.7* 7.3*  --  11.8*   Liver Function Tests: Recent Labs  Lab 02/23/24 0548 02/23/24 1546 02/24/24 0608  ALBUMIN 3.5 3.4* 2.9*   CBC: Recent Labs  Lab 02/17/24 0928 02/19/24 1639 02/20/24 1125 02/20/24 2221 02/21/24 0845 02/22/24 0510 02/23/24 1630  WBC 9.0  --  9.4  --  8.8 7.5  --   NEUTROABS  --   --  7.3  --   --   --   --   HGB 11.4*   < > 11.3* 11.5* 10.3* 10.3* 9.5*  HCT 34.6*   < > 34.6* 34.7* 31.3* 30.8* 28.0*  MCV 94.0  --  93.0  --  94.6 92.5  --   PLT 156  --  180 179 162 172  --    < > = values in this interval not displayed.   Blood Culture    Component Value Date/Time   SDES CSF 02/23/2024 1303   SPECREQUEST SHUNT 02/23/2024 1303   CULT PENDING 02/23/2024 1303   REPTSTATUS PENDING 02/23/2024 1303   Studies/Results: VAS US  TRANSCRANIAL DOPPLER Result Date: 02/22/2024  Transcranial Doppler Patient Name:  [name] [name]  Date of Exam:   02/22/2024  Medical Rec #: [id number]           Accession #:    [id number] Date of Birth: [birth year]            Patient Gender: F Patient Age:   43 years Exam Location:  [facility] Procedure:      VAS US  TRANSCRANIAL DOPPLER Referring Phys: ARY XU --------------------------------------------------------------------------------  Indications: Subarachnoid hemorrhage. Comparison Study: Prior TCD done 6/18 Performing Technologist: Alberta Lis RVS  Examination Guidelines: A complete evaluation includes B-mode imaging, spectral Doppler, color Doppler, and power Doppler as needed of all accessible portions of each vessel. Bilateral testing is considered an integral part of a complete examination. Limited examinations for reoccurring indications may be performed as noted.  +----------+---------------+----------+-----------+------------------+ RIGHT TCD Right VM (cm/s)Depth (cm)Pulsatility     Comment       +----------+---------------+----------+-----------+------------------+ MCA             131  1.14                       +----------+---------------+----------+-----------+------------------+ ACA                                           Unable to insonate +----------+---------------+----------+-----------+------------------+ Term ICA        57                    1.14                       +----------+---------------+----------+-----------+------------------+ PCA P1          -32                   1.97                       +----------+---------------+----------+-----------+------------------+ Opthalmic       20                    1.45                       +----------+---------------+----------+-----------+------------------+ ICA siphon                                    Unable to insonate +----------+---------------+----------+-----------+------------------+ Vertebral                                     Unable to insonate  +----------+---------------+----------+-----------+------------------+ Distal ICA      -15                   1.29                       +----------+---------------+----------+-----------+------------------+  +----------+--------------+----------+-----------+------------------+ LEFT TCD  Left VM (cm/s)Depth (cm)Pulsatility     Comment       +----------+--------------+----------+-----------+------------------+ MCA             24                   1.56                       +----------+--------------+----------+-----------+------------------+ ACA                                          Unable to insonate +----------+--------------+----------+-----------+------------------+ Term ICA        43                   1.27                       +----------+--------------+----------+-----------+------------------+ PCA P1         -27                    16                        +----------+--------------+----------+-----------+------------------+ Opthalmic       27  1.89                       +----------+--------------+----------+-----------+------------------+ ICA siphon                                   Unable to insonate +----------+--------------+----------+-----------+------------------+ Vertebral                                    Unable to insonate +----------+--------------+----------+-----------+------------------+ Distal ICA      17                    1.2                       +----------+--------------+----------+-----------+------------------+  +------------+-------+------------------+             VM cm/s     Comment       +------------+-------+------------------+ Prox Basilar       Unable to insonate +------------+-------+------------------+ +----------------------+---+ Right Lindegaard Ratio8.7 +----------------------+---+ +---------------------+---+ Left Lindegaard Ratio1.4 +---------------------+---+    Preliminary     Medications:  feeding supplement (OSMOLITE 1.5 CAL) 55 mL/hr at 02/24/24 0800   meropenem  (MERREM ) IV Stopped (02/23/24 1748)   norepinephrine  (LEVOPHED ) Adult infusion Stopped (02/23/24 1314)    acetaminophen   650 mg Per Tube Q4H   Or   acetaminophen   650 mg Rectal Q4H   aspirin   81 mg Per Tube Daily   Chlorhexidine  Gluconate Cloth  6 each Topical Daily   cycloSPORINE   25 mg Per Tube Daily   famotidine   10 mg Per Tube QHS   fiber supplement (BANATROL TF)  60 mL Per Tube QID   heparin  injection (subcutaneous)  5,000 Units Subcutaneous Q8H   insulin  aspart  0-9 Units Subcutaneous Q4H   ketorolac   7.5 mg Intravenous Once   midodrine   10 mg Per Tube TID WC   mycophenolate   500 mg Per Tube BID   niMODipine   60 mg Per Tube Q4H   mouth rinse  15 mL Mouth Rinse 4 times per day   predniSONE   15 mg Per Tube Q breakfast   sodium bicarbonate   1,300 mg Per Tube TID   sodium chloride  flush  3 mL Intravenous Q12H   sodium chloride  flush  3-10 mL Intravenous Q12H    Dialysis Orders MWF - AF 3:45hr, 400/500, EDW 57.2kg, 2K/2Ca, RUE AVF, no heparin  - Hectoral 4mcg IV q HD - Mircera 150mcg IV q 2 weeks (last given 6/9 -> due 6/23)   Assessment/Plan: Intracerebral hemorrhage: C/b hydrocephalus, 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 TCDs with evidence of vasospasm 02/11/24. Followed closely by neurosurgery. Ongoing fevers, ?cerebritis: CSF cultured 6/21, now on Vanc + meropenem . AHRF: Improved with HD, s/p course of Zosyn  for possible aspiration pneumonia. BP/volume: Variable, off pressors as of 6/21. ESRD: Hx kidney/pancreas Tx 2014, kidney failed but remains on IS regimen for pancreas. S/p CRRT 6/12-6/12, now back to iHD. Follow labs and reduce/stop bicarb. For HD tomorrow (6/23). Hyperkalemia: Improved s/p HD. 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 Likely start ESA next week. Secondary HPTH:Ca and Phos high, VDRA stopped 6/21, add sevelamer via tube. Nutrition: NG tube in place, on  osmolite - spoke to pharmacy to change to Nepro and consider stopping banatrol supps for hyperK and Phos issues.  Izetta Boehringer, PA-C 02/24/2024, 9:17 AM  [facility]  \

## 2024-02-24 NOTE — Progress Notes (Addendum)
 STROKE TEAM PROGRESS NOTE    SIGNIFICANT HOSPITAL EVENTS 6/11- ED for nausea, vomiting after dialysis 6/12- intubated, EVD placed  6/14 - extubated 6/17 - Acute left cerebellar infarct 6/19 - vasospasm seen on TCD 6/21 - CSF sampling from EVD with WBC 244 and RBC 19000  INTERIM HISTORY/SUBJECTIVE  Patient is drowsy but awake oriented and interactive.  She does complain of headache and asking for pain medications.  She does continue to have disconjugate gaze with signs of bilateral CN VI palsy along with decreased vision to the left side.  Fever has improved, now on Merrem  and Vanco.  CSF cultures pending. EVD continues drainage around 10 mL an hour    OBJECTIVE  CBC    Component Value Date/Time   WBC 7.5 02/22/2024 0510   RBC 3.33 (L) 02/22/2024 0510   HGB 9.5 (L) 02/23/2024 1630   HCT 28.0 (L) 02/23/2024 1630   PLT 172 02/22/2024 0510   MCV 92.5 02/22/2024 0510   MCH 30.9 02/22/2024 0510   MCHC 33.4 02/22/2024 0510   RDW 15.2 02/22/2024 0510   LYMPHSABS 1.2 02/20/2024 1125   MONOABS 0.7 02/20/2024 1125   EOSABS 0.0 02/20/2024 1125   BASOSABS 0.0 02/20/2024 1125    BMET    Component Value Date/Time   NA 134 (L) 02/24/2024 0608   K 5.0 02/24/2024 0608   CL 86 (L) 02/24/2024 0608   CO2 20 (L) 02/24/2024 0608   GLUCOSE 122 (H) 02/24/2024 0608   BUN 77 (H) 02/24/2024 0608   CREATININE 5.39 (H) 02/24/2024 0608   CALCIUM  10.9 (H) 02/24/2024 0608   GFRNONAA 10 (L) 02/24/2024 9391    IMAGING past 24 hours No results found.     Vitals:   02/24/24 0700 02/24/24 0730 02/24/24 0756 02/24/24 0800  BP: (!) 168/76 (!) 179/75  (!) 142/56  Pulse: (!) 128 (!) 130  89  Resp: (!) 29 (!) 37  18  Temp:   98.6 F (37 C)   TempSrc:   Axillary   SpO2: 98% 99%  98%  Weight:      Height:         PHYSICAL EXAM General: Lethargic, NAD CV: ST, 120s, likely due to fever Respiratory: room air, unlabored  NEURO:  Drowsy but awake and interactive. Oriented to place,  year. Poor attention.  L eye is unable to open as much. Dysconjugate gaze with evidence of bilateral CN6 palsy (decreased outward movement bilaterally) and no blink to threat bilaterally.  Decreased L visual field.  Slight right facial droop Slight dysarthria.  Tongue protrusion midline. No drifts seen. Spontaneous anti-gravity movement of all 4 extremities.  Sensation, coordination and gait not tested.   ASSESSMENT/PLAN  Ms. Enes Naseer Canizales is a 43 y.o. female with history of hypertension, ESRD on HD, s/p renal and pancreatic transplant, diabetes mellitus type 1 with gastroparesis presents with nausea and vomiting since after dialysis.  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 Intraventricular blood products layering in the occipital horns.  CTA head & neck - No large vessel occlusion. No high-grade stenosis, aneurysm, or dissection of the arteries in the head and neck. Mild stenosis of the V4 segments of both vertebral arteries. Mild stenosis of the right supraclinoid ICA.  6/12 repeat CT Head- Interval placement of a right frontal approach ventriculostomy with tip in the body of  the right lateral ventricle. Persistent but relatively stable hydrocephalus at this time. Moderate volume acute subarachnoid hemorrhage with associated IVH, similar to prior. No evidence for significant interval bleeding since prior. MRI  Small acute anterior corpus callosum infarct Mildly improved hydrocephalus with EVD in place Decreased perimesencephalic subarachnoid hemorrhage and IVH Trace supratentorial and infratentorial subdural hemorrhage Age advanced chronic small vessel ischemic disease with chronic lacunar infarcts Repeat CT 6/18: Stable multicompartment acute hemorrhage.  No progressive mass effect or new acute hemorrhage.  Stable  ventricular size with right frontal approach. 2D Echo EF 60-65% Recommend cerebral angiogram once stable to rule out prepontine vascular anomaly - On hold per NS, pending medically stable, afebrile now LDL 63 HgbA1c 4.2 VTE prophylaxis - heparin  suq No antithrombotic prior to admission, continue No antithrombotic  Therapy recommendations:  CIR Disposition:  Pending   Vasospasm with left cerebellar infarct CTA head and neck 6/12 - mild irregularity of the BA without significant narrowing, concerning for mild vasospasm MRI 6/15 Small acute anterior corpus callosum infarct CTA head and neck 6/19 substantially decreased caliber of multiple intracranial arteries and bilateral circle-of-Willis branches (especially: Distal left V4, supraclinoid ICAs, MCA and ACA origins) when compared to 02/14/2024. The appearance most resembles a widespread Vasospasm, Vasoconstriction.  TCD monitoring  6/16: No definite evidence of vasospasm noted in identified blood vessels.  6/18: elevated mean flow velocities in right middle cerebral artery suggestive  of moderate and left middle cerebral artery suggestive of mild vasospasm.  CT head 6/19 evolving left cerebellar infarct since 02/19/24 BP augmentation  NSG on board Continue nimodipine    Cerebral edema with brain compression Hydrocephalous s/p EVD CT head Increased caliber of the ventricles concerning for developing communicating hydrocephalus.  NSGY consulted Keppra  discontinued 6/17 EVD placement 6/12, continued per NS CSF sampling from EVD 6/21 with WBC 244 and RBC 19000 Pending culture results  Fever in the setting of EVD and immunosuppressant use Fever since 6/15, Tmax 101.7--102.2--102.9--101.7--103.1--102.5--100--99.6 WBC 9.4--8.8--7.5 On cyclosporine , cellcept  and prednisone  CSF sampling from EVD with WBC 244 and RBC 19000 Pending culture results On meropenem  and vanco  Respiratory failure  Aspiration pneumonia PCCM primary Intubated  6/12, extubated 6/14 Continued on Zosyn  for aspiration and colitis, 8-day course--completed CXR 6/19: Improving pneumonia  Hypertensive Emergency  Now with Mild Induced Hypertension Home meds:  Norvasc , coreg , clonidine  patch, hydralazine   Unstable Off Cleviprex , labetalol  PRN Now off levophed  (6/21) BP goal 140-160 Continue nimodipine   Long term BP goal normotensive  Diabetes Type 1 Hx of pancreas transplant  Chronically on CellCept , cyclosporine  and prednisone  HgbA1c 4.2, goal < 7.0 CBGs, SSI  ESRD on HD Anemia of ESRD Hx of renal transplant (failed) HD patient with fistula Plan for next HD 6/23 Nephrology on board CRRT --> HD treatments now  Chronically on CellCept , cyclosporine  and prednisone  Hgb 9.73m, stable. Per nephrology, start ESA next week Sevelemir per tube  Dysphagia CorTrak in place Speech therapy on board, pending their eval  Tube feeds @ 55, pending change to Nepro  ? Narcotics dependence Per pt she was using large dose of narcotics at home Will put back low dose oxycodone  5mg  Q12h Monitoring mental status  Other medical issues DM gastroparesis  Colitis, completed course of Zosyn  Thrombocytopenia, improved - platelet [phone] Additional Labs sent, B12, Folte, RPR, TSH, Ammonia--WNL  Hospital day # 10  Pt seen by Neuro NP/APP with MD. Note/plan to be edited by MD as needed.    Rocky JAYSON Likes, DNP, AGACNP-BC [name] Please use AMION for  contact information & EPIC for messaging.  ATTENDING NOTE: I reviewed above note and agree with the assessment and plan. Pt was seen and examined.   No family at bedside.  Patient today much more awake alert, interactive, fluent language, no aphasia, follows simple commands, orientated to place, age, and the year but not to months. Able to name and repeat.  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 Tongue midline. Bilateral UEs 4/5, no  drift. Bilaterally LEs 3/5, no drift. Sensation symmetrical bilaterally, b/l FTN tremolos, gait not tested.   Pt mental status much improved from yesterday, currently afebrile, CSF sampling showed elevated WBC and RBC, continue antibiotics for now, CSF culture pending.  EVD management per neurosurgery.  Plan for angiogram next week.  Patient still complaining of headache and body pain, given her large amount of oxycodone  use at home, will resume low dose oxycodone  but avoid to affect mental status.   For detailed assessment and plan, please refer to above as I have made changes wherever appropriate.   Ary Cummins, MD PhD Stroke Neurology 02/24/2024 3:45 PM  This patient is critically ill due to [place], IVH, vasospasm, fever, hypotension and at significant risk of neurological worsening, death form recurrent stroke, sepsis, setpic shock, hydrocephalus. This patient's care requires constant monitoring of vital signs, hemodynamics, respiratory and cardiac monitoring, review of multiple databases, neurological assessment, discussion with family, other specialists and medical decision making of high complexity. I spent 35 minutes of neurocritical care time in the care of this patient. I discussed with Dr. Kara CCM

## 2024-02-24 NOTE — Progress Notes (Signed)
 No new issues or problems overnight.  Patient's fever curve has diminished.  Currently afebrile.  Neurologically stable.  She is awake.  She will verbalize and answer some questions appropriately.  Motor and sensory exam stable.  Ventricular output moderately high.  CSF analysis negative for obvious ventriculitis.  Cultures pending.  Status post subarachnoid hemorrhage with associated hydrocephalus.  Continue supportive efforts and external ventricular drainage.  Plan arteriogram sometime this week per Dr. Lanis in [place].

## 2024-02-24 NOTE — Progress Notes (Signed)
 Nutrition Follow-up  DOCUMENTATION CODES:   Non-severe (moderate) malnutrition in context of chronic illness  INTERVENTION:   Via Cortrak: -Nepro @ 45 ml/hr (1080 ml daily) -Provides 1911 kcals, 87g protein and 785 ml H2O, 103g fat, 27g fiber   -Renavit daily  NUTRITION DIAGNOSIS:   Moderate Malnutrition related to chronic illness (ESRD on HD/DM) as evidenced by mild fat depletion, mild muscle depletion.  Ongoing  GOAL:   Patient will meet greater than or equal to 90% of their needs  Meeting with TF  MONITOR:   Vent status, TF tolerance  REASON FOR ASSESSMENT:   Consult, Ventilator Enteral/tube feeding initiation and management  ASSESSMENT:   Pt with PMH of HTN, ESRD on HD, s/p renal and pancreatic transplant 07/16/13, DM type 1 with gastroparesis admitted with N/V after HD with ICH brainstem hemorrhage with IVH, likely hypertensive.  6/11 - ED for abx for colitis 6/12 - admit for ICH; intubated with EVD; started CRRT 6/13 - s/p cortrak placement; tip gastric 6/14 - extubated  6/15 - CRRT stopped 6/16 -iHD 6/18 -iHD 6/21 -iHD  Received message from Renal provider requesting tube feeds be switched to Nepro given elevated K and Phos levels. They have added phos binder and requesting to remove Banatrol.  Recommend monitor BMs as Nepro has higher fat content which may impact BM consistency. Will provide additional fiber (27g vs 20g w/ Banatrol).  Pt to continue HD MWF.  Admission weight: 130 lbs Current weight: 119 lbs EDW: 57.2 kg (125 lbs)  Medications: Pepcid , Renvela, sodium bicarb, prednisone   Labs reviewed: CBGs: 111-171 Low Na Elevated Phos (4.7) Elevated Mg  Diet Order:   Diet Order             Diet NPO time specified  Diet effective now                   EDUCATION NEEDS:   No education needs have been identified at this time  Skin:  Skin Assessment: Reviewed RN Assessment  Last BM:  6/22 -FMS  Height:   Ht Readings from Last  1 Encounters:  02/14/24 5' 7.5 (1.715 m)    Weight:   Wt Readings from Last 1 Encounters:  02/24/24 54.1 kg    BMI:  Body mass index is 18.4 kg/m.  Estimated Nutritional Needs:   Kcal:  1800-2000  Protein:  75-90 grams  Fluid:  1L + UOP  Morna Lee, MS, RD, LDN Inpatient Clinical Dietitian Contact via Secure chat

## 2024-02-24 NOTE — Plan of Care (Signed)
 Progressing toward goals. Neuro status fluctuates but improving LOC.

## 2024-02-24 NOTE — Progress Notes (Signed)
 NAME:  [name] [name], MRN:  [id number], DOB:  [birth year], LOS: 10 ADMISSION DATE:  02/13/2024, CONSULTATION DATE:  02/14/24 REFERRING MD:  Claudene, CHIEF COMPLAINT:  AMS    History of Present Illness:   43 yo F PMH ESRD on HD, HTN, failed renal txp,  pancreatic txp on mycophenolate  and pred 15, DM1 w gastroparesis who presented to ED 6/11 with n/v, abd pain. Hypertensive in ED SBP 240s. Went for CT a/p and was c/f some colitis. Started on abx, given IVF. C/o pain and was very agitated, rcvd pain meds. Admitted 6/12. Over the course of the day she became less responsive. Remained hypertensive w SBPs >200. Incr O2 req to HHFNC. Pt was to rcv HD 6/12 afternoon but when eval by HD NP, concerns for airway protection. Rcvd narcan  with only modest improvement. PCCM consulted in this setting    Pertinent  Medical History  Failed renal / pancreas txp ESRD  HTN DM1 gastroparesis   Significant Hospital Events: Including procedures, antibiotic start and stop dates in addition to other pertinent events   6/11 ED w n/v AMS. Abx IVF for colitis 6/12 admit. AMS decr LOC. Incr O2 req. PCCM consult and code stroke called seen w rapid response nephro NP then neuro  6/13 intubated with EVD, responding to commands  6/14 extubated 6/18: Dialysis, fevering, EVD at 10 (129 mL last 24 hrs) 6/19 narcotic dependence a driving factor. + vasospasm on TCD CT angio 1. Evolving Left cerebellar infarct since 02/19/2024. No hemorrhagic transformation or mass effect. 2. CTA reveals a substantially decreased caliber of multiple intracranial arteries and bilateral circle-of-Willis branches (especially: Distal left V4, supraclinoid ICAs, MCA and ACA origins) when compared to 02/14/2024. The appearance most resembles a widespread Vasospasm, Vasoconstriction. No associated large vessel occlusion.  Interim History / Subjective:   No acute events overnight Remains tachycardic, more awake this morning    Objective     Blood pressure (!) 168/76, pulse (!) 128, temperature 99.1 F (37.3 C), temperature source Oral, resp. rate (!) 29, height 5' 7.5 (1.715 m), weight 54.1 kg, last menstrual period 02/13/2024, SpO2 98%.        Intake/Output Summary (Last 24 hours) at 02/24/2024 0744 Last data filed at 02/24/2024 0700 Gross per 24 hour  Intake 2248.02 ml  Output 2194 ml  Net 54.02 ml   Filed Weights   02/22/24 0500 02/23/24 0313 02/23/24 0756  Weight: 57.2 kg 53.5 kg 54.1 kg    Examination: General no acute distress, sleeping in bed HENT EVD in place, IVC pink tinged CSF Neuro awake alert to person, place and time. Moving extremities. Confused at times with nonsensical speech. Pulm clear dec bases Card rrr Abd soft Ext warm  Gu anuric   Resolved problem list   Hypertensive emergency  Acute respiratory failure w hypoxia Aspiration PNA (completed rx 6/19) Assessment and Plan   Large intracranial hemorrhage with brain compression, ICH score 4  Scattered SAH, Communicating hydrocephalus: EVD placement  Hx prior ischemic stroke w/ on-going decreased LOC Narcotics contributing to AMS and decreased LOC and as of 6/19 having evidence of vasospasm  plan NSGY following/managing EVD per neurosurgery Continue Nimodipine  Goal SBP 140-160 but not exceed 160 Need cerebral angiogram  Changed antihypertensives to PRN; also has PRN NE  PT/OT/Speech Continue to hold antithrombotic, SCDs  D/C'd PRN oxycodone   Scheduled apap  Ongoing fever Concern for neurogenic etiology vs infectious etiology. Elevated WBCs on CSF with high RBCs Plan Schedule tylenol  650 mg Q6 Trend  CBC Empiric vancomycin /meropenem , f/u csf cultures  ESRD on HD  AGMA (persistent) etiology not clear  Hx failed renal txp Hypercalcemia plan Dialysis per nephrology Continue HCO3 tabs (inc d/t loss from diarrhea)  Daily RFP Daily CMP   Hypotension plan Goal SBP 140-160 midodrine  10mg  TID  Colitis w/ on-going  diarrhea zosyn  completed (6/19) Plan Rectal pouch Monitor for signs of bleeding Added immodium 6/19, use PRN Simethicone  PRN for pain. Inc fiber  Type 1 Diabetes S/P Pancreatic transplant on chronic immunosuppression Glycemic control acceptable plan Monitor blood glucose SSI  continue mycophenolate  and prednisone     Best Practice (right click and Reselect all SmartList Selections daily)   Diet/type: tubefeeds  DVT prophylaxis prophylactic heparin   and asa started 6/20 Pressure ulcer(s): pressure ulcer assessment deferred  GI prophylaxis: PPI Lines: N/A Foley:  N/A Code Status:  full code Last date of multidisciplinary goals of care discussion [friend updated 6/12. Patient[place] ]   Critical care time: 50 min    Dorn Chill, MD [facility] Office: [phone]   See Amion for personal pager PCCM on call pager [phone] until 7pm. Please call Elink 7p-7a. [phone]

## 2024-02-24 NOTE — Progress Notes (Signed)
 eLink Physician-Brief Progress Note Patient Name: [name] [name] DOB: [birth year] MRN: [id number]   Date of Service  02/24/2024  HPI/Events of Note  K+ 5.8 from 5:17pm        ESRD with IHD done yest     NPO, glucose 134.  eICU Interventions  Insuline 5 units and D 25 gm ordered once. F/u K at 1 AM      Intervention Category Intermediate Interventions: Electrolyte abnormality - evaluation and management  Jodelle ONEIDA Hutching 02/24/2024, 8:56 PM  01:28 K+ now 5.7    received the Insulin /D50 earlier, scheduled for IHD for later during the day today      NPO, FT c-line. - lokelma  once and follow labs in AM.

## 2024-02-25 ENCOUNTER — Inpatient Hospital Stay (HOSPITAL_COMMUNITY)

## 2024-02-25 DIAGNOSIS — I739 Peripheral vascular disease, unspecified: Secondary | ICD-10-CM | POA: Diagnosis not present

## 2024-02-25 DIAGNOSIS — E875 Hyperkalemia: Secondary | ICD-10-CM | POA: Diagnosis not present

## 2024-02-25 DIAGNOSIS — I63542 Cerebral infarction due to unspecified occlusion or stenosis of left cerebellar artery: Secondary | ICD-10-CM | POA: Diagnosis not present

## 2024-02-25 DIAGNOSIS — I609 Nontraumatic subarachnoid hemorrhage, unspecified: Secondary | ICD-10-CM | POA: Diagnosis not present

## 2024-02-25 DIAGNOSIS — Z992 Dependence on renal dialysis: Secondary | ICD-10-CM | POA: Diagnosis not present

## 2024-02-25 DIAGNOSIS — N186 End stage renal disease: Secondary | ICD-10-CM | POA: Diagnosis not present

## 2024-02-25 DIAGNOSIS — I615 Nontraumatic intracerebral hemorrhage, intraventricular: Secondary | ICD-10-CM | POA: Diagnosis not present

## 2024-02-25 LAB — CBC
HCT: 26.9 % — ABNORMAL LOW (ref 36.0–46.0)
Hemoglobin: 8.8 g/dL — ABNORMAL LOW (ref 12.0–15.0)
MCH: 31.5 pg (ref 26.0–34.0)
MCHC: 32.7 g/dL (ref 30.0–36.0)
MCV: 96.4 fL (ref 80.0–100.0)
Platelets: 176 10*3/uL (ref 150–400)
RBC: 2.79 MIL/uL — ABNORMAL LOW (ref 3.87–5.11)
RDW: 15.3 % (ref 11.5–15.5)
WBC: 5.9 10*3/uL (ref 4.0–10.5)
nRBC: 0 % (ref 0.0–0.2)

## 2024-02-25 LAB — CULTURE, BLOOD (ROUTINE X 2)
Culture: NO GROWTH
Culture: NO GROWTH
Special Requests: ADEQUATE

## 2024-02-25 LAB — RENAL FUNCTION PANEL
Albumin: 2.8 g/dL — ABNORMAL LOW (ref 3.5–5.0)
Albumin: 3.3 g/dL — ABNORMAL LOW (ref 3.5–5.0)
Anion gap: 34 — ABNORMAL HIGH (ref 5–15)
Anion gap: 20 — ABNORMAL HIGH (ref 5–15)
BUN: 109 mg/dL — ABNORMAL HIGH (ref 6–20)
BUN: 37 mg/dL — ABNORMAL HIGH (ref 6–20)
CO2: 16 mmol/L — ABNORMAL LOW (ref 22–32)
CO2: 25 mmol/L (ref 22–32)
Calcium: 11.8 mg/dL — ABNORMAL HIGH (ref 8.9–10.3)
Calcium: 10.5 mg/dL — ABNORMAL HIGH (ref 8.9–10.3)
Chloride: 87 mmol/L — ABNORMAL LOW (ref 98–111)
Chloride: 89 mmol/L — ABNORMAL LOW (ref 98–111)
Creatinine, Ser: 7.2 mg/dL — ABNORMAL HIGH (ref 0.44–1.00)
Creatinine, Ser: 3.13 mg/dL — ABNORMAL HIGH (ref 0.44–1.00)
GFR, Estimated: 7 mL/min — ABNORMAL LOW (ref >60)
GFR, Estimated: 18 mL/min — ABNORMAL LOW (ref >60)
Glucose, Bld: 111 mg/dL — ABNORMAL HIGH (ref 70–99)
Glucose, Bld: 162 mg/dL — ABNORMAL HIGH (ref 70–99)
Phosphorus: >30 mg/dL — ABNORMAL HIGH (ref 2.5–4.6)
Phosphorus: 6 mg/dL — ABNORMAL HIGH (ref 2.5–4.6)
Potassium: 5.5 mmol/L — ABNORMAL HIGH (ref 3.5–5.1)
Potassium: 3.9 mmol/L (ref 3.5–5.1)
Sodium: 137 mmol/L (ref 135–145)
Sodium: 134 mmol/L — ABNORMAL LOW (ref 135–145)

## 2024-02-25 LAB — GLUCOSE, CAPILLARY
Glucose-Capillary: 125 mg/dL — ABNORMAL HIGH (ref 70–99)
Glucose-Capillary: 121 mg/dL — ABNORMAL HIGH (ref 70–99)
Glucose-Capillary: 140 mg/dL — ABNORMAL HIGH (ref 70–99)
Glucose-Capillary: 161 mg/dL — ABNORMAL HIGH (ref 70–99)
Glucose-Capillary: 159 mg/dL — ABNORMAL HIGH (ref 70–99)

## 2024-02-25 LAB — PHOSPHORUS: Phosphorus: >30 mg/dL — ABNORMAL HIGH (ref 2.5–4.6)

## 2024-02-25 LAB — POTASSIUM: Potassium: 5.7 mmol/L — ABNORMAL HIGH (ref 3.5–5.1)

## 2024-02-25 LAB — MAGNESIUM: Magnesium: 3.4 mg/dL — ABNORMAL HIGH (ref 1.7–2.4)

## 2024-02-25 LAB — PATHOLOGIST SMEAR REVIEW

## 2024-02-25 MED ORDER — SODIUM ZIRCONIUM CYCLOSILICATE 10 G PO PACK
10.0000 g | PACK | Freq: Two times a day (BID) | ORAL | Status: DC
  Filled 2024-02-25: qty 1

## 2024-02-25 MED ORDER — GERHARDT'S BUTT CREAM
TOPICAL_CREAM | CUTANEOUS | Status: DC | PRN
  Filled 2024-02-25: qty 60

## 2024-02-25 MED ORDER — LABETALOL HCL 5 MG/ML IV SOLN
10.0000 mg | INTRAVENOUS | Status: DC | PRN
  Administered 2024-02-25: 10 mg via INTRAVENOUS
  Filled 2024-02-25: qty 4

## 2024-02-25 MED ORDER — VANCOMYCIN HCL 500 MG/100ML IV SOLN
500.0000 mg | INTRAVENOUS | Status: AC
  Administered 2024-02-25: 500 mg via INTRAVENOUS
  Filled 2024-02-25: qty 100

## 2024-02-25 MED ORDER — OXYCODONE HCL 5 MG PO TABS
5.0000 mg | ORAL_TABLET | Freq: Two times a day (BID) | ORAL | Status: DC
  Administered 2024-02-25 – 2024-02-26 (×4): 5 mg
  Filled 2024-02-25 (×3): qty 1

## 2024-02-25 MED ORDER — PHENYLEPHRINE HCL-NACL 20-0.9 MG/250ML-% IV SOLN
0.0000 ug/min | INTRAVENOUS | Status: DC
  Administered 2024-02-25: 20 ug/min via INTRAVENOUS
  Filled 2024-02-25 (×2): qty 250

## 2024-02-25 MED ORDER — SODIUM ZIRCONIUM CYCLOSILICATE 5 G PO PACK
5.0000 g | PACK | Freq: Once | ORAL | Status: AC
  Administered 2024-02-25: 5 g
  Filled 2024-02-25: qty 1

## 2024-02-25 MED ORDER — VANCOMYCIN HCL 500 MG/100ML IV SOLN: 500.0000 mg | INTRAVENOUS | Status: DC

## 2024-02-25 MED ORDER — SODIUM ZIRCONIUM CYCLOSILICATE 10 G PO PACK: 10.0000 g | PACK | Freq: Two times a day (BID) | ORAL | Status: DC

## 2024-02-25 MED ORDER — NUTRISOURCE FIBER PO PACK
1.0000 | PACK | Freq: Four times a day (QID) | ORAL | Status: DC
  Administered 2024-02-25 – 2024-03-03 (×29): 1
  Filled 2024-02-25 (×28): qty 1

## 2024-02-25 MED ORDER — HYDRALAZINE HCL 20 MG/ML IJ SOLN
10.0000 mg | INTRAMUSCULAR | Status: DC | PRN
  Administered 2024-02-25 – 2024-02-26 (×2): 10 mg via INTRAVENOUS
  Filled 2024-02-25 (×2): qty 1

## 2024-02-25 NOTE — Progress Notes (Signed)
  NEUROSURGERY PROGRESS NOTE   No issues overnight. Pt much more awake, interactive this am.  EXAM:  BP (!) 156/85 (BP Location: Left Arm)   Pulse (!) 109   Temp 99.7 F (37.6 C) (Axillary)   Resp 20   Ht 5' 7.5 (1.715 m)   Wt 55.2 kg   LMP 02/13/2024 (Exact Date)   SpO2 100%   BMI 18.78 kg/m   Awake, alert, disoriented  Speech fluent CN grossly intact  MAE EVD in place  IMPRESSION:  43 y.o. female with pos fossa SAH and some spasm, appears to be improving neurologically.   PLAN: - Will cont to monitor exam, may be able to proceed with diagnostic angiogram tomorrow.   Gerldine Maizes, MD [facility]

## 2024-02-25 NOTE — Progress Notes (Signed)
 PT Cancellation Note  Patient Details Name: [name] [name] MRN: [id number] DOB: July 23, 1981   Cancelled Treatment:    Reason Eval/Treat Not Completed: Patient at procedure or test/unavailable - HD at bedside, PT to check back tomorrow.  Ruchama Kubicek S, PT DPT Acute Rehabilitation Services Secure Chat Preferred  Office [phone]    [name] [name] 02/25/2024, 1:27 PM

## 2024-02-25 NOTE — Progress Notes (Signed)
 Pharmacy Antibiotic Note  [name] [name] is a 43 y.o. female admitted on 02/13/2024 with HTN emergency, acute respiratory failure w/ hypoxia and aspiration PNA.  Pharmacy has been consulted for vancomycin  and meropenem  dosing for possible CNS infection in the setting of ICH with brain compression.  Patient has a history of kidney transplant.  Required CRRT 6/12 >> 6/15 and now on intermittent HD.  Planning HD today 6/23 per Renal.  Plan: Vanc 500mg  IV x 1 with HD today Merrem  1gm IV Q24H Monitor HD schedule/tolerance, clinical progress, vanc levels as indicated   Height: 5' 7.5 (171.5 cm) Weight: 55.2 kg (121 lb 11.1 oz) IBW/kg (Calculated) : 62.75  Temp (24hrs), Avg:100.1 F (37.8 C), Min:98.5 F (36.9 C), Max:101.2 F (38.4 C)  Recent Labs  Lab 02/20/24 1125 02/20/24 1632 02/21/24 0845 02/21/24 0846 02/21/24 1710 02/22/24 0510 02/22/24 1754 02/23/24 0548 02/23/24 1546 02/24/24 0608 02/24/24 1717 02/25/24 0545 02/25/24 0645  WBC 9.4  --  8.8  --   --  7.5  --   --   --   --   --   --  5.9  CREATININE  --    < >  --   --    < > 6.74*   < > 3.31* 4.13* 5.39* 6.11* 7.20*  --   LATICACIDVEN  --   --   --  1.2  --   --   --   --   --   --   --   --   --    < > = values in this interval not displayed.    Estimated Creatinine Clearance: 8.9 mL/min (A) (by C-G formula based on SCr of 7.2 mg/dL (H)).    No Known Allergies  Zosyn  6/12 >> 6/19 Merrem  6/21 >> Vanc 6/21 >>    6/21 Vanc 1250mg  at 1800   6/12 BCx - negative 6/12 MRSA PCR - negative 6/18 BCx - NGTD 6/21 CSF cx -  6/21 fungitell pending   Adyan Palau D. [place], PharmD, BCPS, BCCCP 02/25/2024, 12:58 PM

## 2024-02-25 NOTE — Progress Notes (Addendum)
 [facility] Progress Note   Subjective:   Seen in room Verbalizing today C/o headache  Objective Vitals:   02/25/24 0600 02/25/24 0638 02/25/24 0700 02/25/24 0800  BP: (!) 159/71 (!) 167/74 (!) 147/83 (!) 156/85  Pulse: (!) 105 (!) 105 99 (!) 109  Resp: 17 19 19 20   Temp:    99.7 F (37.6 C)  TempSrc:    Axillary  SpO2: 94% 99% 98% 100%  Weight:      Height:       Physical Exam General: Ill appearing, NAD, holding L side of head Mittens in place Heart: Tachycardic, 3/6 murmur Lungs: CTA anteriorly Abdomen: soft Extremities: Trace BLE edema  Dialysis Access:  RUE AVF +t/b  Dialysis Orders MWF - AF 3:45hr, 400/500, EDW 57.2kg, 2K/2Ca, RUE AVF, no heparin  - Hectoral 4mcg IV q HD - Mircera 150mcg IV q 2 weeks (last given 6/9 -> due 6/23)   Assessment/Plan: Intracerebral hemorrhage: C/b hydrocephalus, 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 TCDs with evidence of vasospasm 02/11/24. Followed closely by neurosurgery. Ongoing fevers, ?cerebritis: CSF cultured 6/21, now on Vanc + meropenem . AHRF: Improved with HD, s/p course of Zosyn  for possible aspiration pna BP/volume: Variable, off pressors as of 6/21. ESRD: Hx kidney/pancreas Tx 2014, kidney failed but remains on IS regimen for pancreas. S/p CRRT 6/12-6/12, now back to iHD. HD today.  Hyperkalemia: mid 5's, TFs changed to Nepro yesterday 6/22. Start lokelma  bid until K+ corrected.  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 Likely start ESA next week. Secondary HPTH:Ca and Phos high, VDRA stopped 6/21, add sevelamer via tube. Nutrition: NG tube in place, on nepro now  [name]  MD  [facility] 02/25/2024, 9:26 AM  Recent Labs  Lab 02/23/24 1630 02/24/24 0608 02/24/24 1717 02/25/24 0036 02/25/24 0545 02/25/24 0645  HGB 9.5*  --   --   --   --  8.8*  ALBUMIN  --    < > 3.1*  --  2.8*  --   CALCIUM   --    < > 10.6*  --  11.8*  --   PHOS  --    < > 9.6*  --  >30.0*  --   CREATININE  --    < > 6.11*  --   7.20*  --   K 4.7   < > 5.8* 5.7* 5.5*  --    < > = values in this interval not displayed.    Inpatient medications:  acetaminophen   650 mg Per Tube Q4H   Or   acetaminophen   650 mg Rectal Q4H   aspirin   81 mg Per Tube Daily   Chlorhexidine  Gluconate Cloth  6 each Topical Q0600   cycloSPORINE   25 mg Per Tube Daily   dextrose        famotidine   10 mg Per Tube QHS   heparin  injection (subcutaneous)  5,000 Units Subcutaneous Q8H   insulin  aspart  0-9 Units Subcutaneous Q4H   multivitamin  1 tablet Per Tube QHS   mycophenolate   500 mg Per Tube BID   niMODipine   60 mg Per Tube Q4H   mouth rinse  15 mL Mouth Rinse 4 times per day   oxyCODONE   5 mg Per Tube Q12H   predniSONE   15 mg Per Tube Q breakfast   sevelamer carbonate  2.4 g Per Tube TID WC   sodium bicarbonate   1,300 mg Per Tube TID   sodium chloride  flush  3 mL Intravenous Q12H   sodium chloride   flush  3-10 mL Intravenous Q12H    feeding supplement (NEPRO CARB STEADY) 45 mL/hr at 02/25/24 0800   meropenem  (MERREM ) IV Stopped (02/24/24 1750)   albuterol , dextrose , Gerhardt's butt cream, hydrALAZINE , labetalol , loperamide , simethicone , trimethobenzamide         \

## 2024-02-25 NOTE — Progress Notes (Signed)
   02/25/24 1700  Vitals  Temp 98 F (36.7 C)  Pulse Rate (!) 106  Resp (!) 26  BP 124/70  SpO2 93 %  O2 Device Room Air  Post Treatment  Dialyzer Clearance Lightly streaked  Hemodialysis Intake (mL) 0 mL  Liters Processed 84  Fluid Removed (mL) 2000 mL  Tolerated HD Treatment Yes  AVG/AVF Arterial Site Held (minutes) 10 minutes  AVG/AVF Venous Site Held (minutes) 10 minutes   Received patient in bed to unit.   Alert and oriented.  Informed consent signed and in chart.   TX duration:3.5hrs  Patient tolerated well.  At bedside Alert, without acute distress.  Hand-off given to patient's nurse.   Access used: RAVF Access issues: none  Total UF removed: 2L Medication(s) given: none    [name] [name] [name] Informed consent signed and in chart.

## 2024-02-25 NOTE — Progress Notes (Signed)
 Occupational Therapy Treatment Patient Details Name: [name] [name] MRN: [id number] DOB: 02-11-1981 Today's Date: 02/25/2024   History of present illness [name] [name] is a 22 female who presented to the ED 6/11 with n/v, abd pain after HD. 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 CRRT 6/13- 6/15. 6/12 EVD placed. 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 VSS throughout however pt complaining of a headache. Level of arousal significantly improved once seated EOB and was able to demonstrate short periods of sustained attention to functional tasks. Throughout session pt required Max redirectional cues to not pull on ventri or coretrack tubes. Able to stand with +2 Mod A and take a step toward HOB. Dysconjugate gaze noted with pt closing R eye at times. May try partial occlusion over R eye to attempt to improve functional vision. Patient will benefit from intensive inpatient follow-up therapy, >3 hours/day to maximize functional level of independence. Acute OT to follow.       If plan is discharge home, recommend the following:  A lot of help with walking and/or transfers;Two people to help with walking and/or transfers;A lot of help with bathing/dressing/bathroom;Two people to help with bathing/dressing/bathroom;Assistance with cooking/housework;Assistance with feeding;Direct supervision/assist for medications management;Direct supervision/assist for financial management;Assist for transportation;Supervision due to cognitive status;Help with stairs or ramp for entrance   Equipment Recommendations  Other (comment)    Recommendations for Other Services Rehab consult    Precautions / Restrictions Precautions Precautions: Fall Recall of Precautions/Restrictions:  Impaired Precaution/Restrictions Comments: EVD (RN clamped before session and unclamped after session), cortrak, RIJ, flexiseal Restrictions Weight Bearing Restrictions Per Provider Order: No       Mobility Bed Mobility Overal bed mobility: Needs Assistance Bed Mobility: Supine to Sit, Sit to Supine     Supine to sit: Max assist Sit to supine: Max assist, +2 for safety/equipment        Transfers Overall transfer level: Needs assistance Equipment used: 2 person hand held assist   Sit to Stand: Mod assist, +2 physical assistance                 Balance     Sitting balance-Leahy Scale: Poor Sitting balance - Comments: periods of min A; forward head/kyphotic     Standing balance-Leahy Scale: Poor Standing balance comment: Able to take step toward the R toward [facility]                           ADL either performed or assessed with clinical judgement   ADL   Eating/Feeding: NPO   Grooming: Maximal assistance Grooming Details (indicate cue type and reason): once toothette brought to mouth, pt assisted with brushing teeth for short time period. Pt ablet o twist top off mouth moisturizer however put the cap in her mouth as if it were food. Therapist was able to retireve cap from her mouth. Able to assist with washing face with max redirectional cues however continued to perseverate on her coretrack adn continued to reach for her ventriculostomy tube             Lower Body Dressing: Total assistance Lower Body Dressing Details (indicate cue type and reason): Pt held socks and looked toward feet and stated she needed help. She donned her sock onto her  hand and did not attempt to correct error             Functional mobility during ADLs: Maximal assistance;+2 for physical assistance      Extremity/Trunk Assessment Upper Extremity Assessment RUE Deficits / Details: able to reach top of head to attempt to grab ventriulosty RUE Sensation: decreased  proprioception RUE Coordination: decreased fine motor;decreased gross motor LUE Deficits / Details: difficult to assess due to attentional deficits and cognitive deficits; will further assess (able to unscrew cap on mouth moisturizer)   Lower Extremity Assessment Lower Extremity Assessment: Defer to PT evaluation        Vision   Vision Assessment?: Vision impaired- to be further tested in functional context Eye Alignment: Impaired (comment) Tracking/Visual Pursuits: Decreased smoothness of horizontal tracking;Decreased smoothness of vertical tracking Saccades: Decreased speed of saccadic movement;Additional eye shifts occurred during testing;Impaired - to be further tested in functional context Convergence: Impaired (comment) Visual Fields: Impaired-to be further tested in functional context Dysconjugate gaze with abnormal and uncoordinated oculomotor movements. Pt does not verbalize diplopia however depth perception is impaired. Visual attention improved with R eye occluded. Will assess use of partial occlusion over R eye. May assess vision in supported sitting in chair position at bed level to improve attention to vision instead of trunk control/balance.    Perception Perception Perception: Impaired   Praxis Praxis Praxis: Impaired Praxis Impairment Details: Organization   Communication Communication Communication: Impaired Factors Affecting Communication: Reduced clarity of speech; automatic speech at times; would begin to speak clearly then would have unintelligible speech.    Cognition Arousal: Lethargic Behavior During Therapy: Restless, Flat affect, Impulsive Cognition: Cognition impaired, Difficult to assess Difficult to assess due to: Level of arousal, Impaired communication Orientation impairments: Place, Time, Situation Awareness: Intellectual awareness impaired, Online awareness impaired Memory impairment (select all impairments): Short-term memory, Working [employer] fast streamer,  Conservation officer, historic buildings, Non-declarative long-term memory Attention impairment (select first level of impairment): Focused attention Executive functioning impairment (select all impairments): Initiation, Organization, Sequencing, Reasoning, Problem solving  Significant difficulty initiating tasks however attention appears improved form last session. States she likes music.                   Following commands: Impaired Following commands impaired: Follows one step commands inconsistently      Cueing   Cueing Techniques: Verbal cues, Gestural cues, Tactile cues, Visual cues  Exercises      Shoulder Instructions       General Comments VSS    Pertinent Vitals/ Pain       Pain Assessment Pain Assessment: Faces Faces Pain Scale: Hurts little more Pain Location: headache Pain Descriptors / Indicators: Headache Pain Intervention(s): Limited activity within patient's tolerance  Home Living                                          Prior Functioning/Environment              Frequency  Min 2X/week        Progress Toward Goals  OT Goals(current goals can now be found in the care plan section)  Progress towards OT goals: Progressing toward goals  Acute Rehab OT Goals Patient Stated Goal: did not state OT Goal Formulation: Patient unable to participate in goal setting Time For Goal Achievement: 03/05/24 Potential to Achieve Goals: Good ADL Goals Pt Will Perform Grooming: with min assist Pt  Will Perform Upper Body Dressing: with min assist Pt Will Transfer to Toilet: with mod assist;stand pivot transfer;bedside commode Additional ADL Goal #1: Pt will complete bed mobility with mod A as a precursor to ADLs  Plan      Co-evaluation                 AM-PAC OT 6 Clicks Daily Activity     Outcome Measure   Help from another person eating meals?: Total Help from another person taking care of personal grooming?: A Lot Help from  another person toileting, which includes using toliet, bedpan, or urinal?: Total Help from another person bathing (including washing, rinsing, drying)?: Total Help from another person to put on and taking off regular upper body clothing?: Total Help from another person to put on and taking off regular lower body clothing?: Total 6 Click Score: 7    End of Session    OT Visit Diagnosis: Unsteadiness on feet (R26.81);Other abnormalities of gait and mobility (R26.89);Muscle weakness (generalized) (M62.81);Hemiplegia and hemiparesis Hemiplegia - Right/Left: Right   Activity Tolerance Patient tolerated treatment well   Patient Left in bed;with call bell/phone within reach;with bed alarm set;with SCD's reapplied   Nurse Communication Mobility status;Other (comment) (EVD clamped prior ot session; nsg aware end of session and pt working with ST)        Time: 8896-8864 OT Time Calculation (min): 32 min  Charges: OT General Charges $OT Visit: 1 Visit OT Treatments $Self Care/Home Management : 8-22 mins $Therapeutic Activity: 8-22 mins  Kreg Sink, OT/L   Acute OT Clinical Specialist Acute Rehabilitation Services Pager [phone] Office [phone]   [facility] 02/25/2024, 6:17 PM

## 2024-02-25 NOTE — Progress Notes (Signed)
 eLink Physician-Brief Progress Note Patient Name: [name] [name] DOB: [birth year] MRN: [id number]   Date of Service  02/25/2024  HPI/Events of Note  SBP goal 140-160 but current BP hanging around low 100-110's, on nimodipine   eICU Interventions  Start phenylephrine  infusion     Intervention Category Intermediate Interventions: Hypotension - evaluation and management  [name] [name] 02/25/2024, 11:17 PM

## 2024-02-25 NOTE — Progress Notes (Signed)
 Inpatient Rehab Admissions Coordinator:    CIR following. Pt. States she lives alone. Pt. Has only friends listed in her chart. Her friend, Ole, states that all of patient's family lives [place]. He does not think her friends could care for Pt. After she leaves the hospital, but cannot speak for Pt.'s family as far as whether or not we should be seeking SNF patient. He will try and speak with Pt.'s brother and get back to me.   Leita Kleine, MS, CCC-SLP Rehab Admissions Coordinator  [phone] (celll) [phone] (office)

## 2024-02-25 NOTE — TOC Progression Note (Signed)
 Transition of Care [facility]) - Progression Note    Patient Details  Name: [name] [name] MRN: [id number] Date of Birth: Jun 25, 1981  Transition of Care [facility]) CM/SW Contact  Inocente GORMAN Kindle, LCSW Phone Number: 02/25/2024, 1:45 PM  Clinical Narrative:    CSW continuing to follow for medical stability.    Expected Discharge Plan: Skilled Nursing Facility Barriers to Discharge: Continued Medical Work up, English as a second language teacher, SNF Pending bed offer  Expected Discharge Plan and Services                                               Social Determinants of Health (SDOH) Interventions SDOH Screenings   Food Insecurity: Patient Unable To Answer (02/15/2024)  Housing: Unknown (02/15/2024)  Transportation Needs: Patient Unable To Answer (02/15/2024)  Utilities: Patient Unable To Answer (02/15/2024)  Social Connections: Unknown (10/04/2022)   Received from [facility]  Tobacco Use: Medium Risk (02/13/2024)    Readmission Risk Interventions    11/17/2021   12:15 PM  Readmission Risk Prevention Plan  Transportation Screening Complete  Medication Review (RN Care Manager) Complete  PCP or Specialist appointment within 3-5 days of discharge Complete  HRI or Home Care Consult Complete  SW Recovery Care/Counseling Consult Complete  Palliative Care Screening Not Applicable  Skilled Nursing Facility Patient Refused

## 2024-02-25 NOTE — Progress Notes (Signed)
 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 Hypertensive in ED SBP 240s. Went for CT a/p and was c/f some colitis. Started on abx, given IVF. C/o pain and was very agitated, rcvd pain meds. Admitted 6/12. Over the course of the day she became less responsive. Remained hypertensive w SBPs >200. Incr O2 req to [facility]. Pt was to rcv HD 6/12 afternoon but when eval by HD NP, concerns for airway protection. Rcvd narcan  with only modest improvement. PCCM consulted in this setting.  Pertinent Medical History:  Failed renal / pancreas txp ESRD  HTN DM1 gastroparesis   Significant Hospital Events: Including procedures, antibiotic start and stop dates in addition to other pertinent events   6/11 ED w n/v AMS. Abx IVF for colitis 6/12 Admit. AMS decr LOC. Incr O2 req. PCCM consult and code stroke called seen w rapid response nephro NP then neuro  6/13 Intubated with EVD, responding to commands  6/14 Extubated 6/18 Dialysis, fevering, EVD at 10 (129 mL last 24 hrs) 6/19 Narcotic dependence a driving factor. +Vasospasm on TCD. CTA with evolving Left cerebellar infarct, no HT or mass effect, substantially decreased caliber of multiple intracranial arteries and bilateral Circle-of[place] branches (especially: Distal left V4, supraclinoid ICAs, MCA and ACA origins), appearance c/w widespread Vasospasm, Vasoconstriction. No associated large vessel occlusion.  Interim History / Subjective:  No significant events overnight K 5.8 overnight BUN/Cr uptrending, due for HD today Ongoing fevers, Tmax 101.12F On mero/vanc EVD remains in place. Objective:  Blood pressure (!) 147/83, pulse  99, temperature 99.7 F (37.6 C), temperature source Oral, resp. rate 19, height 5' 7.5 (1.715 m), weight 55.2 kg, last menstrual period 02/13/2024, SpO2 98%.        Intake/Output Summary (Last 24 hours) at 02/25/2024 0741 Last data filed at 02/25/2024 0700 Gross per 24 hour  Intake 1375 ml  Output 849 ml  Net 526 ml   Filed Weights   02/23/24 0756 02/24/24 0702 02/25/24 0500  Weight: 54.1 kg 54.1 kg 55.2 kg   Physical Examination: General: Acutely ill-appearing woman in NAD. Appears uncomfortable. HEENT: Anicteric sclera, PERRL, moist mucous membranes. EVD in place. Neuro: Awake, oriented x 2-3. Intermittently drowsy/dozing off. Responds to verbal stimuli. Following commands intermittently. Moves all 4 extremities spontaneously.  CV: Tachycardic to 100s, III/VI systolic murmur. PULM: Breathing even and unlabored on RA. Lung fields CTAB. GI: Soft, nontender, nondistended. Extremities: Trace symmetric BLE edema noted. RUE AVF with +thrill. Skin: Warm/dry, no rashes.  Resolved problem List:  Hypertensive emergency  Acute respiratory failure w hypoxia Aspiration PNA (completed rx 6/19)  Assessment and Plan:   Large intracranial hemorrhage with brain compression, ICH score 4  Scattered SAH, Communicating hydrocephalus: EVD placement  Hx prior ischemic stroke w/ on-going decreased LOC Narcotics contributing to AMS and decreased LOC and as of 6/19 having evidence of vasospasm  - NSGY and Stroke following, appreciate assistance - EVD management per NSGY - Nimotop  for vasospasm ppx - TCDs per protocol - Goal SBP 140-160 (BPs have been quite labile) - Hydralazine  PRN, also on midodrine  - PT/OT/SLP as able  Ongoing fever Concern for neurogenic etiology vs infectious etiology. Elevated WBCs  on CSF with high RBCs. - Trend WBC, fever curve - F/u Cx data - Continue broad-spectrum antibiotics (meropenem /vanc) - Scheduled APAP Q6H  ESRD on HD  AGMA (persistent) etiology not clear   Hx failed renal txp Hypercalcemia - Nephrology following, appreciate management - HD per Nephro - Trend BMP, Phos - Replete electrolytes as indicated - Continue bicarb tabs - Monitor I&Os - Avoid nephrotoxic agents as able - Ensure adequate renal perfusion  Colitis with ongoing diarrhea Zosyn  completed (6/19). 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 - SSI - CBGs Q4H - Goal CBG 140-180 - Continue ISP (CSA, mycophenolate , prednisone ); pharmacy placed call   Best Practice (right click and Reselect all SmartList Selections daily)   Diet/type: tubefeeds  DVT prophylaxis prophylactic heparin   and ASA started 6/20 Pressure ulcer(s): pressure ulcer assessment deferred  GI prophylaxis: PPI Lines: N/A Foley:  N/A Code Status:  full code Last date of multidisciplinary goals of care discussion [Friend updated 6/12. Patient's family lives overseas.]  Critical care time:   The patient is critically ill with multiple organ system failure and requires high complexity decision making for assessment and support, frequent evaluation and titration of therapies, advanced monitoring, review of radiographic studies and interpretation of complex data.   Critical Care Time devoted to patient care services, exclusive of separately billable procedures, described in this note is 34 minutes.  Corean CHRISTELLA Slevin Gunby, PA-C [facility] 02/25/24 7:41 AM  Please see [email or web address] for pager details.  From 7A-7P if no response, please call [phone] After hours, please call ELink [phone]

## 2024-02-25 NOTE — Progress Notes (Signed)
 Subjective: The patient is much more alert today.  He is in no apparent distress.  Objective: Vital signs in last 24 hours: Temp:  [98.5 F (36.9 C)-101.2 F (38.4 C)] 99.7 F (37.6 C) (06/23 0400) Pulse Rate:  [96-125] 99 (06/23 0700) Resp:  [13-30] 19 (06/23 0700) BP: (133-193)/(59-89) 147/83 (06/23 0700) SpO2:  [91 %-100 %] 98 % (06/23 0700) Weight:  [55.2 kg] 55.2 kg (06/23 0500) Estimated body mass index is 18.78 kg/m as calculated from the following:   Height as of this encounter: 5' 7.5 (1.715 m).   Weight as of this encounter: 55.2 kg.   Intake/Output from previous day: 06/22 0701 - 06/23 0700 In: 1375 [I.V.:10; NG/GT:1225; IV Piggyback:100] Out: 849 [Drains:204; Stool:645] Intake/Output this shift: No intake/output data recorded.  Physical exam the patient is alert and conversant.  She is a bit confused.  She has bilateral cranial nerve VI palsies.  Her ventriculostomy is draining.  Lab Results: Recent Labs    02/23/24 1630 02/25/24 0645  WBC  --  5.9  HGB 9.5* 8.8*  HCT 28.0* 26.9*  PLT  --  176   BMET Recent Labs    02/24/24 1717 02/25/24 0036 02/25/24 0545  NA 137  --  137  K 5.8* 5.7* 5.5*  CL 89*  --  87*  CO2 17*  --  16*  GLUCOSE 134*  --  111*  BUN 91*  --  109*  CREATININE 6.11*  --  7.20*  CALCIUM  10.6*  --  11.8*    Studies/Results: No results found.  Assessment/Plan: Subarachnoid hemorrhage, intraventricular hemorrhage, hydrocephalus, vasospasm: The patient is slowly improving.  Her CSF sent to the lab last week is not suggestive of meningitis.  It looks like she will need the ventriculostomy at least a few more days.  We are awaiting a follow-up cerebral arteriogram.  LOS: 11 days     [name] 02/25/2024, 8:03 AM     Patient ID: [name] [name], female   DOB: Oct 03, 1980, 43 y.o.   MRN: [id number]

## 2024-02-25 NOTE — Progress Notes (Signed)
 Speech Language Pathology Treatment: Dysphagia;Cognitive-Linguistic  Patient Details Name: [name] [name] MRN: [id number] DOB: [birth year] Today's Date: 02/25/2024 Time: 8852-8841 SLP Time Calculation (min) (ACUTE ONLY): 11 min  Assessment / Plan / Recommendation Clinical Impression  Pt alert right after OT session. RN reports they haven't really been offering any sips since she's usually quite inactive. Today pt is able to very briefly attend to speaker in the room, but mostly responding with social greetings. Pt requires multiple repetitions to follow commands. Note mild left facial paresis, decreased movement of tongue to left cheek.  Pt will take sips with total assist, but can also be guided to self feed with hand over hand assist. When unrestrained pt make constant attempts to touch her head where he drain is. Verbal reminders are ineffective. SLP had to replace restraints quickly due to pts inability to understand safety or suppress impulse.  Pt able to take a bite of solid food, but not fully attentive and clearly not using the left side of mouth. Potential for oral holding and pocketing solids are still high. Recommend continuing sips of thin liquids, but also applesauce ice cream pudding etc from staff. May need to encourage pt to take these, she may not initiate eating and drinking.    HPI HPI: Patient is a 43 y.o. female with PMH: ESRD on HD, s/p renal and pancreatic transplant, DM-1 with gastroparesis, anxiety, narcotic abuse, vision loss, non-compliance with medical treatment. She presented to the hospital on 02/14/24 with nausea and vomiting which started after HD with patient developing severe abdominal pain after completing nearly all of her HD session. In ED, she had temp of 95.9 degrees F with tachypnea, elevated BP and SpO2 as low as 89% despite being on HFNC. She was admitted with possible sepsis secondary to colitis, acute respiratory failure with hypoxia, acute metabolic  encephalopathy. 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 CRRT 6/13-15, EVD placed on 6/12. She was emergently intubated on 6/12 and extubated on 6/14. She has been NPO awaiting SLP swallow evaluation.      SLP Plan  Continue with current plan of care          Recommendations  Diet recommendations: Thin liquid;Dysphagia 1 (puree);Other(comment) (continue floor stock snacks) Liquids provided via: Straw Medication Administration: Via alternative means Supervision: Trained caregiver to feed patient;Full supervision/cueing for compensatory strategies                      Frequent or constant Supervision/Assistance Dysphagia, unspecified (R13.10)     Continue with current plan of care     Patrecia Veiga, Consuelo Fitch  02/25/2024, 2:26 PM

## 2024-02-25 NOTE — Progress Notes (Addendum)
 STROKE TEAM PROGRESS NOTE    SIGNIFICANT HOSPITAL EVENTS 6/11- ED for nausea, vomiting after dialysis 6/12- intubated, EVD placed  6/14 - extubated 6/17 - Acute left cerebellar infarct 6/19 - vasospasm seen on TCD 6/21 - CSF sampling from EVD with WBC 244 and RBC 19000  INTERIM HISTORY/SUBJECTIVE  Patient is drowsy this morning. Did received some pain medication prior to our exam. She does continue to have disconjugate gaze with signs of bilateral CN VI palsy along with decreased vision to the left side. Fever has improved, now on Merrem  and Vanco.  CSF cultures pending. EVD continues drainage around 10 mL an hour    OBJECTIVE  CBC    Component Value Date/Time   WBC 5.9 02/25/2024 0645   RBC 2.79 (L) 02/25/2024 0645   HGB 8.8 (L) 02/25/2024 0645   HCT 26.9 (L) 02/25/2024 0645   PLT 176 02/25/2024 0645   MCV 96.4 02/25/2024 0645   MCH 31.5 02/25/2024 0645   MCHC 32.7 02/25/2024 0645   RDW 15.3 02/25/2024 0645   LYMPHSABS 1.2 02/20/2024 1125   MONOABS 0.7 02/20/2024 1125   EOSABS 0.0 02/20/2024 1125   BASOSABS 0.0 02/20/2024 1125    BMET    Component Value Date/Time   NA 137 02/25/2024 0545   K 5.5 (H) 02/25/2024 0545   CL 87 (L) 02/25/2024 0545   CO2 16 (L) 02/25/2024 0545   GLUCOSE 111 (H) 02/25/2024 0545   BUN 109 (H) 02/25/2024 0545   CREATININE 7.20 (H) 02/25/2024 0545   CALCIUM  11.8 (H) 02/25/2024 0545   GFRNONAA 7 (L) 02/25/2024 0545    IMAGING past 24 hours No results found.     Vitals:   02/25/24 0600 02/25/24 0638 02/25/24 0700 02/25/24 0800  BP: (!) 159/71 (!) 167/74 (!) 147/83 (!) 156/85  Pulse: (!) 105 (!) 105 99 (!) 109  Resp: 17 19 19 20   Temp:    99.7 F (37.6 C)  TempSrc:    Axillary  SpO2: 94% 99% 98% 100%  Weight:      Height:         PHYSICAL EXAM General: Lethargic, NAD CV: ST, 120s, likely due to fever Respiratory: room air, unlabored  NEURO:  Drowsy but awake and interactive. Oriented to place, year. Poor attention.   L eye is unable to open as much. Dysconjugate gaze with evidence of bilateral CN6 palsy (decreased outward movement bilaterally) and no blink to threat bilaterally.  Decreased L visual field.  Slight right facial droop Slight dysarthria.  Tongue protrusion midline. No drifts seen. Spontaneous anti-gravity movement of all 4 extremities.  Sensation, coordination and gait not tested.   ASSESSMENT/PLAN  [name] [name] is a 43 y.o. female with history of hypertension, ESRD on HD, s/p renal and pancreatic transplant, diabetes mellitus type 1 with gastroparesis presents with nausea and vomiting since after dialysis.  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 Intraventricular blood products layering in the occipital horns.  CTA head & neck - No large vessel occlusion. No high-grade stenosis, aneurysm, or dissection of the arteries in the head and neck. Mild stenosis of the V4 segments of both vertebral arteries. Mild stenosis of the right supraclinoid ICA.  6/12 repeat CT Head- Interval placement of a right frontal approach ventriculostomy with tip in the body of the right lateral ventricle. Persistent but relatively  stable hydrocephalus at this time. Moderate volume acute subarachnoid hemorrhage with associated IVH, similar to prior. No evidence for significant interval bleeding since prior. MRI  Small acute anterior corpus callosum infarct Mildly improved hydrocephalus with EVD in place Decreased perimesencephalic subarachnoid hemorrhage and IVH Trace supratentorial and infratentorial subdural hemorrhage Age advanced chronic small vessel ischemic disease with chronic lacunar infarcts Repeat CT 6/18: Stable multicompartment acute hemorrhage.  No progressive mass effect or new acute hemorrhage.  Stable ventricular size with  right frontal approach. 2D Echo EF 60-65% Recommend cerebral angiogram once stable to rule out prepontine vascular anomaly - On hold per NS, pending medically stable  LDL 63 HgbA1c 4.2 VTE prophylaxis - heparin  suq No antithrombotic prior to admission, now on No antithrombotic  Therapy recommendations:  CIR Disposition:  Pending   Vasospasm with left cerebellar infarct CTA head and neck 6/12 - mild irregularity of the BA without significant narrowing, concerning for mild vasospasm MRI 6/15 Small acute anterior corpus callosum infarct CTA head and neck 6/19 substantially decreased caliber of multiple intracranial arteries and bilateral circle-of-Willis branches (especially: Distal left V4, supraclinoid ICAs, MCA and ACA origins) when compared to 02/14/2024. The appearance most resembles a widespread Vasospasm, Vasoconstriction.  TCD monitoring  6/16: No definite evidence of vasospasm noted in identified blood vessels.  6/18: elevated mean flow velocities in right middle cerebral artery suggestive  of moderate and left middle cerebral artery suggestive of mild vasospasm.  CT head 6/19 evolving left cerebellar infarct since 02/19/24 BP augmentation  NSG on board Continue nimodipine    Cerebral edema with brain compression Hydrocephalous s/p EVD CT head Increased caliber of the ventricles concerning for developing communicating hydrocephalus.  NSGY consulted Keppra  discontinued 6/17 EVD placement 6/12, continued per NS CSF sampling from EVD 6/21 with WBC 244 and RBC 19000, culture NGTD  Fever in the setting of EVD and immunosuppressant use Fever since 6/15, Tmax 101.7--102.2--102.9--101.7--103.1--102.5--101.2 WBC 9.4--8.8--7.5--5/9 On cyclosporine , cellcept  and prednisone  CSF sampling from EVD with WBC 244 and RBC 19000, culture NGTD On meropenem  and vanco  Respiratory failure  Aspiration pneumonia PCCM primary Intubated 6/12, extubated 6/14 Continued on Zosyn  for aspiration and  colitis, 8-day course--completed CXR 6/19: Improving pneumonia  Hypertensive Emergency  Now with Mild Induced Hypertension Home meds:  Norvasc , coreg , clonidine  patch, hydralazine   Unstable Off Cleviprex , labetalol  PRN Now off levophed  (6/21) BP goal 140-160 Continue nimodipine   Long term BP goal normotensive  Diabetes Type 1 Hx of pancreas transplant  Chronically on CellCept , cyclosporine  and prednisone  HgbA1c 4.2, goal < 7.0 CBGs, SSI  ESRD on HD Anemia of ESRD Hx of renal transplant (failed) HD patient with fistula Plan for next HD 6/23 Nephrology on board CRRT --> HD treatments now  Chronically on CellCept , cyclosporine  and prednisone  Hgb 9.5->8.8 Sevelemir per tube  Dysphagia CorTrak in place Speech therapy on board, pending their eval  Tube feeds Nepro @ 55   ? Narcotics dependence Per pt she was using large dose of narcotics at home Will put back low dose oxycodone  5mg  Q12h Monitoring mental status  Other medical issues DM gastroparesis  Colitis, completed course of Zosyn  Thrombocytopenia, improved - platelet [phone] Additional Labs sent, B12, Folte, RPR, TSH, Ammonia--WNL  Hospital day # 11  Patient seen and examined by NP/APP with MD. MD to update note as needed.   Jorene Last, DNP, FNP-BC [name] Pager: [phone]  ATTENDING NOTE: I reviewed above note and agree with the assessment and plan. Pt was seen and examined.   Pt  drowsy sleepy and briefly open eyes on voice but not able to keep opening without repetitive stimulation. Per RN, pt AAO x 3 this morning but became drowsy sleepy after bath. Still has intermittent fever, on Abx. Pending cerebral angiogram. CSF culture so far negative. On oxycodone  5mg  Q12h. No additional narcotics needed at this time. PT and OT following, CIR vs. SNF.   For detailed assessment and plan, please refer to above as I have made changes wherever appropriate.   Ary Cummins, MD  PhD Stroke Neurology 02/25/2024 5:08 PM  This patient is critically ill due to [facility], IVH, vasospasm, fever, hypotension and at significant risk of neurological worsening, death form recurrent stroke, sepsis, setpic shock, hydrocephalus. This patient's care requires constant monitoring of vital signs, hemodynamics, respiratory and cardiac monitoring, review of multiple databases, neurological assessment, discussion with family, other specialists and medical decision making of high complexity. I spent 35 minutes of neurocritical care time in the care of this patient. Discussed with CCM PA.

## 2024-02-26 ENCOUNTER — Inpatient Hospital Stay (HOSPITAL_COMMUNITY)

## 2024-02-26 DIAGNOSIS — R509 Fever, unspecified: Secondary | ICD-10-CM | POA: Diagnosis not present

## 2024-02-26 DIAGNOSIS — I615 Nontraumatic intracerebral hemorrhage, intraventricular: Secondary | ICD-10-CM | POA: Diagnosis not present

## 2024-02-26 DIAGNOSIS — I609 Nontraumatic subarachnoid hemorrhage, unspecified: Secondary | ICD-10-CM

## 2024-02-26 DIAGNOSIS — I739 Peripheral vascular disease, unspecified: Secondary | ICD-10-CM | POA: Diagnosis not present

## 2024-02-26 DIAGNOSIS — G935 Compression of brain: Secondary | ICD-10-CM | POA: Diagnosis not present

## 2024-02-26 DIAGNOSIS — I63542 Cerebral infarction due to unspecified occlusion or stenosis of left cerebellar artery: Secondary | ICD-10-CM | POA: Diagnosis not present

## 2024-02-26 DIAGNOSIS — G9341 Metabolic encephalopathy: Secondary | ICD-10-CM | POA: Diagnosis not present

## 2024-02-26 LAB — RENAL FUNCTION PANEL
Albumin: 3.2 g/dL — ABNORMAL LOW (ref 3.5–5.0)
Albumin: 3.2 g/dL — ABNORMAL LOW (ref 3.5–5.0)
Anion gap: 21 — ABNORMAL HIGH (ref 5–15)
Anion gap: 27 — ABNORMAL HIGH (ref 5–15)
BUN: 56 mg/dL — ABNORMAL HIGH (ref 6–20)
BUN: 72 mg/dL — ABNORMAL HIGH (ref 6–20)
CO2: 26 mmol/L (ref 22–32)
CO2: 22 mmol/L (ref 22–32)
Calcium: 11 mg/dL — ABNORMAL HIGH (ref 8.9–10.3)
Calcium: 11 mg/dL — ABNORMAL HIGH (ref 8.9–10.3)
Chloride: 88 mmol/L — ABNORMAL LOW (ref 98–111)
Chloride: 88 mmol/L — ABNORMAL LOW (ref 98–111)
Creatinine, Ser: 4.01 mg/dL — ABNORMAL HIGH (ref 0.44–1.00)
Creatinine, Ser: 5.18 mg/dL — ABNORMAL HIGH (ref 0.44–1.00)
GFR, Estimated: 14 mL/min — ABNORMAL LOW (ref >60)
GFR, Estimated: 10 mL/min — ABNORMAL LOW (ref >60)
Glucose, Bld: 123 mg/dL — ABNORMAL HIGH (ref 70–99)
Glucose, Bld: 121 mg/dL — ABNORMAL HIGH (ref 70–99)
Phosphorus: 9.4 mg/dL — ABNORMAL HIGH (ref 2.5–4.6)
Phosphorus: 10.6 mg/dL — ABNORMAL HIGH (ref 2.5–4.6)
Potassium: 4.4 mmol/L (ref 3.5–5.1)
Potassium: 5.4 mmol/L — ABNORMAL HIGH (ref 3.5–5.1)
Sodium: 135 mmol/L (ref 135–145)
Sodium: 137 mmol/L (ref 135–145)

## 2024-02-26 LAB — BODY FLUID CULTURE W GRAM STAIN

## 2024-02-26 LAB — GLUCOSE, CAPILLARY
Glucose-Capillary: 110 mg/dL — ABNORMAL HIGH (ref 70–99)
Glucose-Capillary: 114 mg/dL — ABNORMAL HIGH (ref 70–99)
Glucose-Capillary: 117 mg/dL — ABNORMAL HIGH (ref 70–99)
Glucose-Capillary: 120 mg/dL — ABNORMAL HIGH (ref 70–99)
Glucose-Capillary: 124 mg/dL — ABNORMAL HIGH (ref 70–99)
Glucose-Capillary: 127 mg/dL — ABNORMAL HIGH (ref 70–99)
Glucose-Capillary: 131 mg/dL — ABNORMAL HIGH (ref 70–99)

## 2024-02-26 LAB — MAGNESIUM: Magnesium: 3.2 mg/dL — ABNORMAL HIGH (ref 1.7–2.4)

## 2024-02-26 MED ORDER — HYDRALAZINE HCL 20 MG/ML IJ SOLN
10.0000 mg | INTRAMUSCULAR | Status: DC | PRN
  Administered 2024-02-27 – 2024-03-22 (×33): 10 mg via INTRAVENOUS
  Filled 2024-02-26 (×38): qty 1

## 2024-02-26 MED ORDER — LABETALOL HCL 5 MG/ML IV SOLN
10.0000 mg | INTRAVENOUS | Status: DC | PRN
  Administered 2024-02-27 – 2024-03-07 (×22): 10 mg via INTRAVENOUS
  Filled 2024-02-26 (×21): qty 4

## 2024-02-26 MED ORDER — CYCLOSPORINE MODIFIED (NEORAL) 100 MG/ML PO SOLN
225.0000 mg | Freq: Every day | ORAL | Status: DC
  Administered 2024-02-26 – 2024-03-12 (×15): 225 mg
  Filled 2024-02-26 (×17): qty 2.3

## 2024-02-26 MED ORDER — CEFAZOLIN SODIUM-DEXTROSE 1-4 GM/50ML-% IV SOLN
1.0000 g | INTRAVENOUS | Status: AC
  Administered 2024-02-26 – 2024-03-03 (×7): 1 g via INTRAVENOUS
  Filled 2024-02-26 (×7): qty 50

## 2024-02-26 MED ORDER — CYCLOSPORINE MODIFIED (NEORAL) 100 MG/ML PO SOLN
200.0000 mg | Freq: Every day | ORAL | Status: DC
  Administered 2024-02-26 – 2024-03-11 (×14): 200 mg
  Filled 2024-02-26 (×18): qty 2

## 2024-02-26 MED ORDER — MIDODRINE HCL 5 MG PO TABS
10.0000 mg | ORAL_TABLET | Freq: Three times a day (TID) | ORAL | Status: DC
  Administered 2024-02-26 – 2024-03-04 (×9): 10 mg
  Filled 2024-02-26 (×10): qty 2

## 2024-02-26 MED ORDER — CHLORHEXIDINE GLUCONATE CLOTH 2 % EX PADS: 6.0000 | MEDICATED_PAD | Freq: Every day | CUTANEOUS | Status: DC

## 2024-02-26 NOTE — Progress Notes (Signed)
 Afternoon round No acute events today. Was to have diagnostic angiogram today, but is to be scheduled for tomorrow ~3p per NSGY. Nursing called with concern about monitor beeping for STE. Obtained 12 lead which does not look acutely changed from previous. Will check her K with next renal function panel.

## 2024-02-26 NOTE — Progress Notes (Signed)
 Inpatient Rehab Admissions Coordinator:    Pt.'s friends listed in chart both confirm that they cannot care for Pt. At d/c and all family is abroad. She will need SNF placement due to lack of caregiver support after CIR. I wills sign off.   Leita Kleine, MS, CCC-SLP Rehab Admissions Coordinator  [phone] (celll) [phone] (office)

## 2024-02-26 NOTE — Progress Notes (Signed)
 Inpatient Rehab Admissions Coordinator:    I continue to be unable to reach Pt.'s family. Friends say they cannot care for Pt. Here, suspect she will need SNF.  Leita Kleine, MS, CCC-SLP Rehab Admissions Coordinator  [phone] (celll) [phone] (office)

## 2024-02-26 NOTE — Progress Notes (Signed)
 Subjective: The patient is a little more somnolent today.  Her level of consciousness continues to fluctuate daily.  Objective: Vital signs in last 24 hours: Temp:  [98 F (36.7 C)-100.2 F (37.9 C)] 100 F (37.8 C) (06/24 0400) Pulse Rate:  [97-121] 97 (06/24 0700) Resp:  [11-34] 16 (06/24 0700) BP: (94-173)/(56-91) 154/81 (06/24 0700) SpO2:  [88 %-100 %] 94 % (06/24 0700) Weight:  [54.5 kg] 54.5 kg (06/24 0500) Estimated body mass index is 18.54 kg/m as calculated from the following:   Height as of this encounter: 5' 7.5 (1.715 m).   Weight as of this encounter: 54.5 kg.   Intake/Output from previous day: 06/23 0701 - 06/24 0700 In: 1854.7 [I.V.:149.1; NG/GT:1255.5; IV Piggyback:200] Out: 2662 [Drains:162; Stool:500] Intake/Output this shift: No intake/output data recorded.  Physical exam the patient is somnolent but easily arousable.  She is moving all 4 extremities.  Her pupils are equal.  Her ventriculostomy put out 173 cc yesterday.  Lab Results: Recent Labs    02/23/24 1630 02/25/24 0645  WBC  --  5.9  HGB 9.5* 8.8*  HCT 28.0* 26.9*  PLT  --  176   BMET Recent Labs    02/25/24 1821 02/26/24 0433  NA 134* 135  K 3.9 4.4  CL 89* 88*  CO2 25 26  GLUCOSE 162* 123*  BUN 37* 56*  CREATININE 3.13* 4.01*  CALCIUM  10.5* 11.0*    Studies/Results: No results found.  Assessment/Plan: Subarachnoid hemorrhage, intraventricular hemorrhage, hydrocephalus, status post ventriculostomy: The plan is for an arteriogram today to rule out recurrent sources of bleeding.  Perhaps we can begin to elevate her ventriculostomy in the next day or 2.  LOS: 12 days     [name] [name] 02/26/2024, 7:37 AM     Patient ID: [name] [name], female   DOB: 24-Jul-1981, 43 y.o.   MRN: [id number]

## 2024-02-26 NOTE — Progress Notes (Addendum)
 STROKE TEAM PROGRESS NOTE    SIGNIFICANT HOSPITAL EVENTS 6/11- ED for nausea, vomiting after dialysis 6/12- intubated, EVD placed  6/14 - extubated 6/17 - Acute left cerebellar infarct 6/19 - vasospasm seen on TCD 6/21 - CSF sampling from EVD with WBC 244 and RBC 19000  INTERIM HISTORY/SUBJECTIVE Patient is more awake more. Did received some pain medication prior to our exam. She does continue to have disconjugate gaze with signs of bilateral CN VI palsy along with decreased vision to the left side. Fever has improved, deescalate abx to ancef .  CSF cultures pending. EVD continues drainage around 10 mL an hour- hopefully raise drain tomorrow Plan for angio today per neurosurgery Denies headache this morning   OBJECTIVE  CBC    Component Value Date/Time   WBC 5.9 02/25/2024 0645   RBC 2.79 (L) 02/25/2024 0645   HGB 8.8 (L) 02/25/2024 0645   HCT 26.9 (L) 02/25/2024 0645   PLT 176 02/25/2024 0645   MCV 96.4 02/25/2024 0645   MCH 31.5 02/25/2024 0645   MCHC 32.7 02/25/2024 0645   RDW 15.3 02/25/2024 0645   LYMPHSABS 1.2 02/20/2024 1125   MONOABS 0.7 02/20/2024 1125   EOSABS 0.0 02/20/2024 1125   BASOSABS 0.0 02/20/2024 1125    BMET    Component Value Date/Time   NA 135 02/26/2024 0433   K 4.4 02/26/2024 0433   CL 88 (L) 02/26/2024 0433   CO2 26 02/26/2024 0433   GLUCOSE 123 (H) 02/26/2024 0433   BUN 56 (H) 02/26/2024 0433   CREATININE 4.01 (H) 02/26/2024 0433   CALCIUM  11.0 (H) 02/26/2024 0433   GFRNONAA 14 (L) 02/26/2024 0433    IMAGING past 24 hours No results found.     Vitals:   02/26/24 0715 02/26/24 0730 02/26/24 0800 02/26/24 0815  BP: (!) 156/80 (!) 160/83 (!) 170/87 (!) 162/81  Pulse: (!) 104 (!) 105 (!) 114 (!) 112  Resp: 14 12 16 18   Temp:      TempSrc:      SpO2: 95% 94% 96% 94%  Weight:      Height:         PHYSICAL EXAM General: Lethargic, NAD CV: ST, 110s Respiratory: room air, unlabored  NEURO:  Drowsy but awake and interactive.  Oriented to place, year.  L eye is unable to open as much. Dysconjugate gaze with evidence of bilateral CN6 palsy (decreased outward movement bilaterally) and no blink to threat bilaterally.  Decreased L visual field.  Slight right facial droop Slight dysarthria.  Tongue protrusion midline. No drifts seen. Spontaneous anti-gravity movement of all 4 extremities.  Sensation, coordination and gait not tested.   ASSESSMENT/PLAN  [name] [name] is a 43 y.o. female with history of hypertension, ESRD on HD, s/p renal and pancreatic transplant, diabetes mellitus type 1 with gastroparesis presents with nausea and vomiting since after dialysis.  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 Intraventricular blood products layering in the occipital horns.  CTA head & neck - No large vessel occlusion. No high-grade stenosis, aneurysm, or dissection of the arteries in the head and neck. Mild stenosis of the V4 segments of both vertebral arteries. Mild stenosis of the right supraclinoid ICA.  6/12 repeat CT Head- Interval placement of a right frontal approach ventriculostomy with tip in the body of the right lateral ventricle. Persistent but  relatively stable hydrocephalus at this time. Moderate volume acute subarachnoid hemorrhage with associated IVH, similar to prior. No evidence for significant interval bleeding since prior. MRI  Small acute anterior corpus callosum infarct Mildly improved hydrocephalus with EVD in place Decreased perimesencephalic subarachnoid hemorrhage and IVH Trace supratentorial and infratentorial subdural hemorrhage Age advanced chronic small vessel ischemic disease with chronic lacunar infarcts Repeat CT 6/18: Stable multicompartment acute hemorrhage.  No progressive mass effect or new acute hemorrhage.   Stable ventricular size with right frontal approach. 2D Echo EF 60-65% Recommend cerebral angiogram once stable to rule out prepontine vascular anomaly and to evaluate vasospasm LDL 63 HgbA1c 4.2 VTE prophylaxis - heparin  suq No antithrombotic prior to admission, now on No antithrombotic  Therapy recommendations:  CIR Disposition:  Pending   Vasospasm with left cerebellar infarct CTA head and neck 6/12 - mild irregularity of the BA without significant narrowing, concerning for mild vasospasm MRI 6/15 Small acute anterior corpus callosum infarct CTA head and neck 6/19 substantially decreased caliber of multiple intracranial arteries and bilateral circle-of-Willis branches (especially: Distal left V4, supraclinoid ICAs, MCA and ACA origins) when compared to 02/14/2024. The appearance most resembles a widespread Vasospasm, Vasoconstriction.  TCD monitoring  6/16: No definite evidence of vasospasm noted in identified blood vessels.  6/18: elevated mean flow velocities in right middle cerebral artery suggestive of moderate and left middle cerebral artery suggestive of mild vasospasm.  CT head 6/19 evolving left cerebellar infarct since 02/19/24 Cerebral angiogram pending BP augmentation  NSG on board Continue nimodipine    Cerebral edema with brain compression Hydrocephalous s/p EVD CT head Increased caliber of the ventricles concerning for developing communicating hydrocephalus.  NSGY consulted Keppra  discontinued 6/17 EVD placement 6/12, continued per NS Plan to raise pressure in 1-2 days  Fever in the setting of EVD and immunosuppressant use Fever since 6/15, Tmax 101.7-102.2-102.9-101.7-103.1-102.5-101.2-100 WBC 9.4--8.8--7.5--5.9 On cyclosporine , cellcept  and prednisone  CSF sampling from EVD with WBC 244 and RBC 19000, culture NGTD On meropenem  and vanco -> ancef   Respiratory failure  Aspiration pneumonia PCCM primary Intubated 6/12, extubated 6/14 Continued on Zosyn  for  aspiration and colitis, 8-day course--completed CXR 6/19: Improving pneumonia  Hypertensive Emergency  Now with Mild Induced Hypertension Home meds:  Norvasc , coreg , clonidine  patch, hydralazine   Unstable Off Cleviprex , labetalol  PRN Now off levophed  (6/21) BP goal 140-160 Continue nimodipine   Long term BP goal normotensive  Diabetes Type 1 Hx of pancreas transplant  Chronically on CellCept , cyclosporine  and prednisone  HgbA1c 4.2, goal < 7.0 CBGs, SSI  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 Narcotics dependence Per [name] [name] she was using large dose of narcotics at home Will put back low dose oxycodone  5mg  Q12h Monitoring mental status  Other medical issues DM gastroparesis  Colitis, completed course of Zosyn  Thrombocytopenia, improved - platelet [phone] Additional Labs sent, B12, Folte, RPR, TSH, Ammonia--WNL  Hospital day # 12  Patient seen and examined by NP/APP with MD. MD to update note as needed.   Jorene Last, DNP, FNP-BC [name] Pager: [phone]  ATTENDING NOTE: I reviewed above note and agree with the assessment and plan. [name] [name] was seen and examined.   No family at the bedside. [name] [name] mildly lethargic  and sleepy but easily arousable, smiling and conversant. Mental status improved from yesterday and similar to two days ago. Fever much improved, no leukocytosis. CSF culture neg so far, change meropenum and vanco to Ancef . Plan for angiogram today. Plan to raise EVD pressure in 1-2 days.   For detailed assessment and plan, please refer to above as I have made changes wherever appropriate.   Ary Cummins, MD PhD Stroke Neurology 02/26/2024 1:53  PM   This patient is critically ill due to [facility], IVH, vasospasm, fever, hypotension and at significant risk of neurological worsening, death form recurrent stroke, sepsis, setpic shock, hydrocephalus. This patient's care requires constant monitoring of vital signs, hemodynamics, respiratory and cardiac monitoring, review of multiple databases, neurological assessment, discussion with family, other specialists and medical decision making of high complexity. I spent 35 minutes of neurocritical care time in the care of this patient. Discussed with CCM PA.

## 2024-02-26 NOTE — Progress Notes (Signed)
 ST elevation alarm on monitor, EKG ordered per Tinnie Furth PA.  EKG read ST. Tinnie Furth notified, will check K.   Verbal order from Dr. Lanis to titrate off neo and keep SBP goal< 160 if no neuro change present.    Dr. Mavis notified that EVD has a minimal intermittent leak at site.  No new orders.

## 2024-02-26 NOTE — Progress Notes (Signed)
 Pt appears to be neurologically stable, awake enough to proceed with diagnostic angiogram. Tentatively planning on tomorrow afternoon ~3p.  Gerldine Maizes, MD [facility]

## 2024-02-26 NOTE — Progress Notes (Addendum)
 [facility] Progress Note   Subjective:   Seen in room No c/o's  Objective Vitals:   02/26/24 1200 02/26/24 1215 02/26/24 1230 02/26/24 1300  BP: (!) 163/75 (!) 174/77 (!) 155/75 (!) 160/73  Pulse: (!) 116 (!) 115 (!) 110 (!) 118  Resp: 11 (!) 21 (!) 21 14  Temp: 98.6 F (37 C)     TempSrc: Axillary     SpO2: 95% 96% 96% 97%  Weight:      Height:       Physical Exam General: Ill appearing, NAD, holding L side of head Mittens in place Heart: Tachycardic, 3/6 murmur Lungs: CTA anteriorly Abdomen: soft Extremities: Trace BLE edema  Dialysis Access:  RUE AVF +t/b  OP HD:  MWF AF 3h  B400   57.2kg  2K   AVF  Heparin  none - Hectoral 4mcg IV q HD - Mircera 150mcg IV q 2 weeks (last given 6/9 -> due 6/23)   Assessment/Plan: Intracerebral hemorrhage: C/b hydrocephalus, 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 TCDs with evidence of vasospasm 02/11/24. Followed closely by neurosurgery. Ongoing fevers, ?cerebritis: CSF cultured 6/21, now on Vanc + meropenem . AHRF: s/p course of Zosyn  for possible asp pna BP/volume: Variable, off pressors as of 6/21. ESRD: S/p CRRT 6/12-6/12, now back to iHD. NO heparin . HD tomorrow Hyperkalemia: TFs changed to Nepro 6/22. K+ better, dc lokelma .  Anemia of ESRD: Hgb 8-10 here, stable. Consider start ESA if not getting better. Secondary HPTH:Ca and Phos high, VDRA stopped 6/21, add sevelamer via tube. Nutrition: NG tube in place, on nepro now Pancreas/ kidney transplant (2014): for the pancreas, will resume her usual dosing of the CyA at 225mg  qam and 200 mg qpm. She has been underdosed here for about the last 2 wks. Will we get a level in 3-4 days after resuming the full dose.   Myer Fret  MD  [facility] 02/26/2024, 1:01 PM  Recent Labs  Lab 02/23/24 1630 02/24/24 9391 02/25/24 0645 02/25/24 0911 02/25/24 1821 02/26/24 0433  HGB 9.5*  --  8.8*  --   --   --   ALBUMIN  --    < >  --   --  3.3* 3.2*  CALCIUM   --    < >   --   --  10.5* 11.0*  PHOS  --    < >  --    < > 6.0* 9.4*  CREATININE  --    < >  --   --  3.13* 4.01*  K 4.7   < >  --   --  3.9 4.4   < > = values in this interval not displayed.    Inpatient medications:  acetaminophen   650 mg Per Tube Q4H   Or   acetaminophen   650 mg Rectal Q4H   aspirin   81 mg Per Tube Daily   Chlorhexidine  Gluconate Cloth  6 each Topical Q0600   cycloSPORINE   25 mg Per Tube Daily   fiber  1 packet Per Tube QID   heparin  injection (subcutaneous)  5,000 Units Subcutaneous Q8H   insulin  aspart  0-9 Units Subcutaneous Q4H   midodrine   10 mg Per Tube Q8H   multivitamin  1 tablet Per Tube QHS   mycophenolate   500 mg Per Tube BID   niMODipine   60 mg Per Tube Q4H   mouth rinse  15 mL Mouth Rinse 4 times per day   oxyCODONE   5 mg Per Tube Q12H   predniSONE   15 mg Per Tube Q breakfast   sevelamer carbonate  2.4 g Per Tube TID WC   sodium bicarbonate   1,300 mg Per Tube TID   sodium chloride  flush  3 mL Intravenous Q12H   sodium chloride  flush  3-10 mL Intravenous Q12H   sodium zirconium cyclosilicate   10 g Per Tube BID     ceFAZolin  (ANCEF ) IV     feeding supplement (NEPRO CARB STEADY) Stopped (02/26/24 0414)   phenylephrine  (NEO-SYNEPHRINE) Adult infusion Stopped (02/26/24 1146)   albuterol , Gerhardt's butt cream, hydrALAZINE , labetalol , simethicone , trimethobenzamide         \

## 2024-02-26 NOTE — Progress Notes (Signed)
 Physical Therapy Treatment Patient Details Name: [name] [name] MRN: [id number] DOB: Jul 13, 1981 Today's Date: 02/26/2024   History of Present Illness [name] [name] is a 37 female who presented to the ED 6/11 with n/v, abd pain after HD. 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 CRRT 6/13- 6/15. 6/12 EVD placed. 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 PT focus of session on seated and standing balance. Pt overall requiring mod +2 assist for transfer-level mobility, once standing pt with tolerance ~10 seconds before fatiguing. Of note, PT noticed pt with leakage from EVD site, especially with pt leaning anteriorly and when standing. PT immediately returned pt to supine, RN brought to room to assess. RN inspecting EVD site upon PT exit. Of note, drain was clamped during session and line was carefully monitored for duration of session with no tension/pulling to EVD noted.    If plan is discharge home, recommend the following: Assistance with cooking/housework;Assistance with feeding;Direct supervision/assist for medications management;Direct supervision/assist for financial management;Assist for transportation;Help with stairs or ramp for entrance;Supervision due to cognitive status;A lot of help with walking and/or transfers;A lot of help with bathing/dressing/bathroom   Can travel by private vehicle        Equipment Recommendations  Wheelchair cushion (measurements PT);Wheelchair (measurements PT);Hospital bed    Recommendations for Other Services       Precautions / Restrictions Precautions Precautions: Fall Recall of Precautions/Restrictions: Impaired Precaution/Restrictions Comments: EVD (RN clamped before session and unclamped after session), cortrak,  RIJ, flexiseal Restrictions Weight Bearing Restrictions Per Provider Order: No     Mobility  Bed Mobility Overal bed mobility: Needs Assistance Bed Mobility: Supine to Sit, Sit to Supine Rolling: Mod assist Sidelying to sit: Mod assist   Sit to supine: Max assist, +2 for physical assistance   General bed mobility comments: assist for trunk and LE management, more assist to return to supine vs getting to EOB.    Transfers Overall transfer level: Needs assistance Equipment used: 2 person hand held assist Transfers: Sit to/from Stand Sit to Stand: Mod assist, +2 safety/equipment           General transfer comment: assist for rise, steady. Stand x2, lateral step towards HOB x1 but fatigues after this. HR 120s with activity, other VSS    Ambulation/Gait               General Gait Details: nt   Stairs             Wheelchair Mobility     Tilt Bed    Modified Rankin (Stroke Patients Only) Modified Rankin (Stroke Patients Only) Pre-Morbid Rankin Score: No symptoms Modified Rankin: Severe disability     Balance Overall balance assessment: Needs assistance Sitting-balance support: No upper extremity supported, Feet supported Sitting balance-Leahy Scale: Poor Sitting balance - Comments: requires intermittent min assist, R lateral bias in sitting Postural control: Right lateral lean   Standing balance-Leahy Scale: Poor Standing balance comment: Able to take step toward the R toward [facility]                            Communication Communication Communication: Impaired  Cognition Arousal: Lethargic Behavior During Therapy: Flat affect   PT - Cognitive impairments: No family/caregiver present to determine baseline,  Awareness, Attention, Initiation, Sequencing, Problem solving, Safety/Judgement                       PT - Cognition Comments: Pt responding yes/no only this session, otherwise moaning in pain but not able to state where.  More lethargic today vs last PT session Following commands: Impaired Following commands impaired: Follows one step commands inconsistently    Cueing Cueing Techniques: Verbal cues  Exercises      General Comments General comments (skin integrity, edema, etc.): Pt with leakage from EVD site with anterior leaning in sitting and in standing. Pt returned to supine, RN immediately brought to room to assess. RN inspecting EVD site upon PT exit. Of note, drain was clamped during session.      Pertinent Vitals/Pain Pain Assessment Pain Assessment: Faces Faces Pain Scale: Hurts even more Pain Location: generalized, cannot localize when asked Pain Descriptors / Indicators: Grimacing, Moaning Pain Intervention(s): Limited activity within patient's tolerance, Monitored during session, Repositioned    Home Living                          Prior Function            PT Goals (current goals can now be found in the care plan section) Acute Rehab PT Goals PT Goal Formulation: With patient Time For Goal Achievement: 03/05/24 Potential to Achieve Goals: Good Progress towards PT goals: Progressing toward goals    Frequency    Min 3X/week      PT Plan      Co-evaluation              AM-PAC PT 6 Clicks Mobility   Outcome Measure  Help needed turning from your back to your side while in a flat bed without using bedrails?: A Lot Help needed moving from lying on your back to sitting on the side of a flat bed without using bedrails?: A Lot Help needed moving to and from a bed to a chair (including a wheelchair)?: Total Help needed standing up from a chair using your arms (e.g., wheelchair or bedside chair)?: Total Help needed to walk in hospital room?: Total Help needed climbing 3-5 steps with a railing? : Total 6 Click Score: 8    End of Session   Activity Tolerance: Patient limited by fatigue Patient left: in bed;with call bell/phone within reach;with bed alarm  set;with family/visitor present;with restraints reapplied (donned bilat wrist restraints with order from RN given drain site with drainage and pt reaching for EVD throughout session) Nurse Communication: Mobility status PT Visit Diagnosis: Unsteadiness on feet (R26.81);Other abnormalities of gait and mobility (R26.89);Muscle weakness (generalized) (M62.81);Hemiplegia and hemiparesis Hemiplegia - Right/Left: Left Hemiplegia - dominant/non-dominant: Non-dominant Hemiplegia - caused by: Other Nontraumatic intracranial hemorrhage     Time: 1035-1056 PT Time Calculation (min) (ACUTE ONLY): 21 min  Charges:    $Neuromuscular Re-education: 8-22 mins PT General Charges $$ ACUTE PT VISIT: 1 Visit                     Johana RAMAN, PT DPT Acute Rehabilitation Services Secure Chat Preferred  Office [phone]    Lavontay Kirk [place] 02/26/2024, 4:28 PM

## 2024-02-26 NOTE — Progress Notes (Signed)
 Transcranial Doppler   Date POD PCO2 HCT BP   MCA ACA PCA OPHT SIPH VERT Basilar  6/16 CK       130/59 Right  Left   *  13   *     -50 *     25 25     21  *     * -28     -18 -22       6/18 rs         Right  Left   130     110 -61     -53 54     26 19     19  *     * -42     -14 *        6/20 CK         Right  Left    131   24    *   *    -32   -27    20   27     *   *    *   *    *       6/24,rs            Right  Left    197   146    -42   -46    66   50    24   27    *   *    -24   -21    -38                   Right  Left                                                               Right  Left                                                               Right  Left                                                     *Unable to insonate [place] Ratio:  RIGHT - 5.1  -LEFT - 4.2 MCA = Middle Cerebral Artery      OPHT = Opthalmic Artery     BASILAR = Basilar Artery   ACA = Anterior Cerebral Artery     SIPH = Carotid Siphon PCA = Posterior Cerebral Artery   VERT = Verterbral Artery                    Normal MCA = 62+\-12 ACA = 50+\-12 PCA = 42+\-23   02/26/2024  2:59 PM [name] [name], [name] [name] BIRCH

## 2024-02-26 NOTE — Progress Notes (Signed)
 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 Hypertensive in ED SBP 240s. Went for CT a/p and was c/f some colitis. Started on abx, given IVF. C/o pain and was very agitated, rcvd pain meds. Admitted 6/12. Over the course of the day she became less responsive. Remained hypertensive w SBPs >200. Incr O2 req to [facility]. Pt was to rcv HD 6/12 afternoon but when eval by HD NP, concerns for airway protection. Rcvd narcan  with only modest improvement. PCCM consulted in this setting.  Pertinent Medical History:  Failed renal / pancreas txp ESRD  HTN DM1 gastroparesis   Significant Hospital Events: Including procedures, antibiotic start and stop dates in addition to other pertinent events   6/11 ED w n/v AMS. Abx IVF for colitis 6/12 Admit. AMS decr LOC. Incr O2 req. PCCM consult and code stroke called seen w rapid response nephro NP then neuro  6/13 Intubated with EVD, responding to commands  6/14 Extubated 6/18 Dialysis, fevering, EVD at 10 (129 mL last 24 hrs) 6/19 Narcotic dependence a driving factor. +Vasospasm on TCD. CTA with evolving Left cerebellar infarct, no HT or mass effect, substantially decreased caliber of multiple intracranial arteries and bilateral Circle-of-Willis branches (especially: Distal left V4, supraclinoid ICAs, MCA and ACA origins), appearance c/w widespread Vasospasm, Vasoconstriction. No associated large vessel occlusion. 6/24: CT angio today by NSGY   Interim History / Subjective:  NAEON. Placed on peripheral neo to sustain MAP goal 140-160; low grade fevers ongoing tmax 100.2 Objective:  Blood pressure (!) 142/78, pulse (!) 103, temperature 100  F (37.8 C), temperature source Axillary, resp. rate 19, height 5' 7.5 (1.715 m), weight 54.5 kg, last menstrual period 02/13/2024, SpO2 93%.        Intake/Output Summary (Last 24 hours) at 02/26/2024 0710 Last data filed at 02/26/2024 0600 Gross per 24 hour  Intake 1854.66 ml  Output 2662 ml  Net -807.34 ml   Filed Weights   02/24/24 0702 02/25/24 0500 02/26/24 0500  Weight: 54.1 kg 55.2 kg 54.5 kg   Physical Examination: General: Acutely ill-appearing female, sitting in bed, nad  HEENT: Anicteric sclera, pupils 1-19mm, reactive; mucous membranes dry. EVD in place. Neuro: Awake, not responding verbally; intermittently makes a grunt. She withdraws in RUE/BLE to painful stimulus. Unable to elicit response in LUE. 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 PULM: Breathing even and unlabored on RA. Lung fields CTAB. GI: Soft, nontender, nondistended. Extremities: Trace symmetric BLE edema noted. RUE AVF with +thrill.  Resolved problem List:  Hypertensive emergency  Acute respiratory failure w hypoxia Aspiration PNA (completed rx 6/19)  Assessment and Plan:   Large intracranial hemorrhage with brain compression, ICH score 4  Scattered SAH, Communicating hydrocephalus: EVD placement  Hx prior ischemic stroke w/ on-going decreased LOC Narcotics contributing to AMS and decreased LOC and as of 6/19 having evidence of vasospasm  - NSGY and Stroke following, appreciate assistance - CT angiogram when okay'd by NSGY - EVD management per NSGY - Nimotop  for vasospasm ppx - TCDs per protocol - Goal SBP 140-160 - BP labile; started on neo 6/23 overnight for SBP 110s, now in range.  -  con't neosynephrine to help with MAP goal  - restart home midodrine   - PRN hydralazine  - PT/OT/SLP as able  Ongoing fever Concern for neurogenic etiology vs infectious etiology. Elevated WBCs on CSF with high RBCs. - Trend WBC, fever curve - EVD cultures negative; Bcx  continue to be negative  - switch to Ancef  until EVD is out  - Scheduled APAP Q6H  ESRD on HD  AGMA (persistent) etiology not clear  Hx failed renal txp Hypercalcemia - Nephrology following, appreciate management - HD per Nephro - Trend BMP, Phos - Replete electrolytes as indicated - Continue bicarb tabs - Monitor I&Os - Avoid nephrotoxic agents as able - Ensure adequate renal perfusion  Colitis with ongoing diarrhea Zosyn  completed (6/19). 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 - SSI - CBGs Q4H - Goal CBG 140-180 - Continue ISP (CSA, mycophenolate , prednisone ); pharmacy trying to confirm dosing of immunosuppressants   Best Practice (right click and Reselect all SmartList Selections daily)   Diet/type: tubefeeds  DVT prophylaxis prophylactic heparin   and ASA started 6/20 GI prophylaxis: N/A Lines: N/A - EVD and yes it is still needed  Foley:  N/A Code Status:  full code Last date of multidisciplinary goals of care discussion [Friend updated 6/12. Patient[place].]  Critical care time: 49    Tinnie FORBES Adolph DEVONNA [facility] [facility] 02/26/24 7:10 AM  Please see [email or web address] for pager details.  From 7A-7P if no response, please call [phone] After hours, please call ELink [phone]

## 2024-02-27 ENCOUNTER — Inpatient Hospital Stay (HOSPITAL_COMMUNITY)

## 2024-02-27 DIAGNOSIS — I609 Nontraumatic subarachnoid hemorrhage, unspecified: Secondary | ICD-10-CM | POA: Diagnosis not present

## 2024-02-27 DIAGNOSIS — I739 Peripheral vascular disease, unspecified: Secondary | ICD-10-CM | POA: Diagnosis not present

## 2024-02-27 DIAGNOSIS — G9341 Metabolic encephalopathy: Secondary | ICD-10-CM | POA: Diagnosis not present

## 2024-02-27 DIAGNOSIS — R509 Fever, unspecified: Secondary | ICD-10-CM | POA: Diagnosis not present

## 2024-02-27 DIAGNOSIS — G935 Compression of brain: Secondary | ICD-10-CM | POA: Diagnosis not present

## 2024-02-27 DIAGNOSIS — G8929 Other chronic pain: Secondary | ICD-10-CM

## 2024-02-27 DIAGNOSIS — I615 Nontraumatic intracerebral hemorrhage, intraventricular: Secondary | ICD-10-CM | POA: Diagnosis not present

## 2024-02-27 DIAGNOSIS — I63542 Cerebral infarction due to unspecified occlusion or stenosis of left cerebellar artery: Secondary | ICD-10-CM | POA: Diagnosis not present

## 2024-02-27 HISTORY — PX: IR ANGIO INTRA EXTRACRAN SEL INTERNAL CAROTID BILAT MOD SED: IMG5363

## 2024-02-27 HISTORY — PX: IR ANGIO VERTEBRAL SEL VERTEBRAL UNI R MOD SED: IMG5368

## 2024-02-27 LAB — RENAL FUNCTION PANEL
Albumin: 3 g/dL — ABNORMAL LOW (ref 3.5–5.0)
Albumin: 2.8 g/dL — ABNORMAL LOW (ref 3.5–5.0)
Anion gap: 30 — ABNORMAL HIGH (ref 5–15)
Anion gap: 18 — ABNORMAL HIGH (ref 5–15)
BUN: 92 mg/dL — ABNORMAL HIGH (ref 6–20)
BUN: 32 mg/dL — ABNORMAL HIGH (ref 6–20)
CO2: 19 mmol/L — ABNORMAL LOW (ref 22–32)
CO2: 26 mmol/L (ref 22–32)
Calcium: 11.4 mg/dL — ABNORMAL HIGH (ref 8.9–10.3)
Calcium: 10.1 mg/dL (ref 8.9–10.3)
Chloride: 87 mmol/L — ABNORMAL LOW (ref 98–111)
Chloride: 95 mmol/L — ABNORMAL LOW (ref 98–111)
Creatinine, Ser: 6.11 mg/dL — ABNORMAL HIGH (ref 0.44–1.00)
Creatinine, Ser: 2.86 mg/dL — ABNORMAL HIGH (ref 0.44–1.00)
GFR, Estimated: 8 mL/min — ABNORMAL LOW (ref >60)
GFR, Estimated: 20 mL/min — ABNORMAL LOW (ref >60)
Glucose, Bld: 120 mg/dL — ABNORMAL HIGH (ref 70–99)
Glucose, Bld: 169 mg/dL — ABNORMAL HIGH (ref 70–99)
Phosphorus: 11.8 mg/dL — ABNORMAL HIGH (ref 2.5–4.6)
Phosphorus: 6 mg/dL — ABNORMAL HIGH (ref 2.5–4.6)
Potassium: 4.4 mmol/L (ref 3.5–5.1)
Potassium: 4.7 mmol/L (ref 3.5–5.1)
Sodium: 136 mmol/L (ref 135–145)
Sodium: 139 mmol/L (ref 135–145)

## 2024-02-27 LAB — FUNGITELL BETA-D-GLUCAN
Fungitell Value:: 58.878 pg/mL
Result Name:: NEGATIVE

## 2024-02-27 LAB — GLUCOSE, CAPILLARY
Glucose-Capillary: 113 mg/dL — ABNORMAL HIGH (ref 70–99)
Glucose-Capillary: 110 mg/dL — ABNORMAL HIGH (ref 70–99)
Glucose-Capillary: 109 mg/dL — ABNORMAL HIGH (ref 70–99)
Glucose-Capillary: 136 mg/dL — ABNORMAL HIGH (ref 70–99)
Glucose-Capillary: 138 mg/dL — ABNORMAL HIGH (ref 70–99)

## 2024-02-27 LAB — MAGNESIUM: Magnesium: 3.4 mg/dL — ABNORMAL HIGH (ref 1.7–2.4)

## 2024-02-27 MED ORDER — LIDOCAINE HCL 1 % IJ SOLN
INTRAMUSCULAR | Status: AC
  Filled 2024-02-27: qty 20

## 2024-02-27 MED ORDER — LIDOCAINE HCL 1 % IJ SOLN
10.0000 mL | Freq: Once | INTRAMUSCULAR | Status: AC
  Administered 2024-02-27: 10 mL via INTRADERMAL

## 2024-02-27 MED ORDER — FENTANYL CITRATE (PF) 100 MCG/2ML IJ SOLN
INTRAMUSCULAR | Status: AC
  Filled 2024-02-27: qty 2

## 2024-02-27 MED ORDER — MIDAZOLAM HCL 2 MG/2ML IJ SOLN
INTRAMUSCULAR | Status: AC
Start: 2024-02-27 — End: 2024-02-27
  Filled 2024-02-27: qty 2

## 2024-02-27 MED ORDER — OXYCODONE HCL 5 MG PO TABS
5.0000 mg | ORAL_TABLET | Freq: Three times a day (TID) | ORAL | Status: DC
  Administered 2024-02-27 – 2024-03-04 (×18): 5 mg
  Filled 2024-02-27 (×18): qty 1

## 2024-02-27 MED ORDER — OXYCODONE HCL 5 MG PO TABS: 5.0000 mg | ORAL_TABLET | Freq: Three times a day (TID) | ORAL | Status: DC

## 2024-02-27 MED ORDER — FENTANYL CITRATE PF 50 MCG/ML IJ SOSY
12.5000 ug | PREFILLED_SYRINGE | Freq: Once | INTRAMUSCULAR | Status: AC
  Administered 2024-02-27: 12.5 ug via INTRAVENOUS
  Filled 2024-02-27: qty 1

## 2024-02-27 MED ORDER — HEPARIN SODIUM (PORCINE) 1000 UNIT/ML IJ SOLN
INTRAMUSCULAR | Status: AC
  Filled 2024-02-27: qty 10

## 2024-02-27 MED ORDER — IOHEXOL 300 MG/ML  SOLN: 100.0000 mL | Freq: Once | INTRAMUSCULAR | Status: DC | PRN

## 2024-02-27 MED ORDER — POLYVINYL ALCOHOL 1.4 % OP SOLN: 1.0000 [drp] | OPHTHALMIC | Status: DC | PRN

## 2024-02-27 MED ORDER — BUTALBITAL-APAP-CAFFEINE 50-325-40 MG PO TABS: 1.0000 | ORAL_TABLET | ORAL | Status: DC | PRN

## 2024-02-27 MED ORDER — FENTANYL CITRATE (PF) 100 MCG/2ML IJ SOLN
INTRAMUSCULAR | Status: AC | PRN
  Administered 2024-02-27: 25 ug via INTRAVENOUS

## 2024-02-27 MED ORDER — BUTALBITAL-APAP-CAFFEINE 50-325-40 MG PO TABS
1.0000 | ORAL_TABLET | ORAL | Status: DC | PRN
  Administered 2024-02-27 – 2024-03-13 (×26): 1
  Filled 2024-02-27 (×26): qty 1

## 2024-02-27 NOTE — Progress Notes (Signed)
 [facility] Progress Note   Subjective:   Seen in room No c/o's  Objective Vitals:   02/27/24 0845 02/27/24 0900 02/27/24 0915 02/27/24 0930  BP: 132/87 135/67 (!) 140/65 (!) 161/93  Pulse: (!) 109 (!) 108 (!) 111 (!) 111  Resp: (!) 26 16 16 14   Temp:      TempSrc:      SpO2: 98% 97% 97% 95%  Weight:      Height:       Physical Exam General: Ill appearing, NAD, holding L side of head Mittens in place Heart: Tachycardic, 3/6 murmur Lungs: CTA anteriorly Abdomen: soft Extremities: Trace BLE edema  Dialysis Access:  RUE AVF +t/b  OP HD:  MWF AF 3h  B400   57.2kg  2K   AVF  Heparin  none - Hectoral 4mcg IV q HD - Mircera 150mcg IV q 2 weeks (last given 6/9 -> due 6/23)   Assessment/Plan: Intracerebral hemorrhage: C/b hydrocephalus, 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 TCDs with evidence of vasospasm 02/11/24. Followed closely by neurosurgery. To get arteriogram later today.  Ongoing fevers: CSF culture 6/21 was negative, now on IV Ancef . AHRF: s/p course of Zosyn  for possible asp pna BP/volume: Variable, off pressors as of 6/21. ESRD: S/p CRRT 6/12-6/12, now back to iHD. NO heparin . HD today.  Hyperkalemia: TFs changed to Nepro 6/22. K+ better, dc lokelma .  Anemia of ESRD: Hgb 8-10 here, stable. Consider start ESA if not getting better. Secondary HPTH:Ca and Phos high, VDRA stopped 6/21, add sevelamer via tube. Nutrition: NG tube in place, on nepro now Pancreas/ kidney transplant (2014): for the pancreas, will resume her usual dosing of the CyA at 225mg  qam and 200 mg qpm. She has been underdosed here for about the last 2 wks. Will we get a level in 3-4 days after resuming the full dose.   Myer Fret  MD  [facility] 02/27/2024, 9:35 AM  Recent Labs  Lab 02/23/24 1630 02/24/24 9391 02/25/24 0645 02/25/24 0911 02/26/24 1545 02/27/24 0541  HGB 9.5*  --  8.8*  --   --   --   ALBUMIN  --    < >  --    < > 3.2* 3.0*  CALCIUM   --    < >  --    < >  11.0* 11.4*  PHOS  --    < >  --    < > 10.6* 11.8*  CREATININE  --    < >  --    < > 5.18* 6.11*  K 4.7   < >  --    < > 5.4* 4.4   < > = values in this interval not displayed.    Inpatient medications:  acetaminophen   650 mg Per Tube Q4H   Or   acetaminophen   650 mg Rectal Q4H   aspirin   81 mg Per Tube Daily   Chlorhexidine  Gluconate Cloth  6 each Topical Q0600   Chlorhexidine  Gluconate Cloth  6 each Topical Q0600   cycloSPORINE   200 mg Per Tube QHS   cycloSPORINE   225 mg Per Tube Daily   fiber  1 packet Per Tube QID   heparin  injection (subcutaneous)  5,000 Units Subcutaneous Q8H   insulin  aspart  0-9 Units Subcutaneous Q4H   midodrine   10 mg Per Tube Q8H   multivitamin  1 tablet Per Tube QHS   mycophenolate   500 mg Per Tube BID   niMODipine   60 mg Per Tube Q4H  mouth rinse  15 mL Mouth Rinse 4 times per day   oxyCODONE   5 mg Per Tube Q12H   predniSONE   15 mg Per Tube Q breakfast   sevelamer carbonate  2.4 g Per Tube TID WC   sodium bicarbonate   1,300 mg Per Tube TID   sodium chloride  flush  3 mL Intravenous Q12H   sodium chloride  flush  3-10 mL Intravenous Q12H     ceFAZolin  (ANCEF ) IV Stopped (02/26/24 2240)   feeding supplement (NEPRO CARB STEADY) 45 mL/hr at 02/27/24 0700   albuterol , Gerhardt's butt cream, hydrALAZINE , labetalol , simethicone , trimethobenzamide         \

## 2024-02-27 NOTE — Progress Notes (Signed)
   02/27/24 1215  Vitals  Temp (!) 97.2 F (36.2 C)  Temp Source Axillary  BP (!) 175/84  MAP (mmHg) 111  BP Location Left Arm  BP Method Automatic  Patient Position (if appropriate) Lying  Pulse Rate 100  Pulse Rate Source Monitor  ECG Heart Rate (!) 101  Resp 14  Weight 55.1 kg  Type of Weight Post-Dialysis  Oxygen  Therapy  SpO2 94 %  O2 Device Room Air  During Treatment Monitoring  Blood Flow Rate (mL/min) 0 mL/min  Arterial Pressure (mmHg) 31.31 mmHg  Venous Pressure (mmHg) -177.77 mmHg  TMP (mmHg) 13.94 mmHg  Ultrafiltration Rate (mL/min) 194 mL/min  Dialysate Flow Rate (mL/min) 300 ml/min  Duration of HD Treatment -hour(s) 3.5 hour(s)  Cumulative Fluid Removed (mL) per Treatment  0.03  Post Treatment  Dialyzer Clearance Lightly streaked  Hemodialysis Intake (mL) 0 mL  Liters Processed 7.1  Fluid Removed (mL) 0 mL  Tolerated HD Treatment Yes  Post-Hemodialysis Comments tx completed tolerated well no fluid removal per order  AVG/AVF Arterial Site Held (minutes) 7 minutes  AVG/AVF Venous Site Held (minutes) 7 minutes  Note  Patient Observations alert talking to primary  Fistula / Graft Right Upper arm Arteriovenous fistula  Placement Date/Time: (c) 12/08/20 1403   Placed prior to admission: Yes  Orientation: Right  Access Location: Upper arm  Access Type: Arteriovenous fistula  Site Condition No complications  Fistula / Graft Assessment Present;Thrill;Bruit  Status Deaccessed   Received patient in bed to unit.  Alert and oriented.  Informed consent signed and in chart.   TX duration:3.5  Patient tolerated well.   Alert, without acute distress.  Hand-off given to patient's nurse.   Access used: RUA fistula Access issues: none  Total UF removed: 0 Medication(s) given: none Post HD VS: see above Post HD weight: 55.5kg   Docia CHRISTELLA [facility]

## 2024-02-27 NOTE — Progress Notes (Signed)
 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 Hypertensive in ED SBP 240s. Went for CT a/p and was c/f some colitis. Started on abx, given IVF. C/o pain and was very agitated, rcvd pain meds. Admitted 6/12. Over the course of the day she became less responsive. Remained hypertensive w SBPs >200. Incr O2 req to [facility]. Pt was to rcv HD 6/12 afternoon but when eval by HD NP, concerns for airway protection. Rcvd narcan  with only modest improvement. PCCM consulted in this setting.  Pertinent Medical History:  Failed renal / pancreas txp ESRD  HTN DM1 gastroparesis   Significant Hospital Events: Including procedures, antibiotic start and stop dates in addition to other pertinent events   6/11 ED w n/v AMS. Abx IVF for colitis 6/12 Admit. AMS decr LOC. Incr O2 req. PCCM consult and code stroke called seen w rapid response nephro NP then neuro  6/13 Intubated with EVD, responding to commands  6/14 Extubated 6/18 Dialysis, fevering, EVD at 10 (129 mL last 24 hrs) 6/19 Narcotic dependence a driving factor. +Vasospasm on TCD. CTA with evolving Left cerebellar infarct, no HT or mass effect, substantially decreased caliber of multiple intracranial arteries and bilateral Circle-of-Willis branches (especially: Distal left V4, supraclinoid ICAs, MCA and ACA origins), appearance c/w widespread Vasospasm, Vasoconstriction. No associated large vessel occlusion. 6/25: CT angio today with nsgy   Interim History / Subjective:  NAEON. Off neo since yesterday. Dialysis today. Also to have ct angio with Dr. Lanis this afternoon. Objective:  Blood pressure (!) 159/79, pulse (!) 111,  temperature 98.2 F (36.8 C), temperature source Axillary, resp. rate 16, height 5' 7.5 (1.715 m), weight 55.1 kg, last menstrual period 02/13/2024, SpO2 98%.        Intake/Output Summary (Last 24 hours) at 02/27/2024 0739 Last data filed at 02/27/2024 0700 Gross per 24 hour  Intake 735.19 ml  Output 818 ml  Net -82.81 ml   Filed Weights   02/25/24 0500 02/26/24 0500 02/27/24 0500  Weight: 55.2 kg 54.5 kg 55.1 kg   Physical Examination: General: Acutely ill-appearing female, sitting in bed, nad  HEENT: Anicteric sclera, pupils 1-18mm, reactive; mucous membranes dry. EVD in place. Neuro: Awake, verbally responsive, asking about pain medications; intermittently makes a grunt. 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 Moving all extremities but does not consistently follow commands CV: Tachycardic to 100s, III/VI systolic murmur. PULM: Breathing even and unlabored on RA. Lung fields CTAB. GI: Soft, nontender, nondistended. Extremities: Trace symmetric BLE edema noted. RUE AVF with +thrill.  Resolved problem List:  Hypertensive emergency  Acute respiratory failure w hypoxia Aspiration PNA (completed rx 6/19)  Assessment and Plan:   Large intracranial hemorrhage with brain compression, ICH score 4  Scattered SAH, Communicating hydrocephalus: EVD placement  Hx prior ischemic stroke w/ on-going decreased LOC Narcotics contributing to AMS and decreased LOC and as of 6/19 having evidence of vasospasm  - NSGY and Stroke following, appreciate assistance - CT angiogram when okay'd by NSGY - EVD management per NSGY - to raise drain today after angio - Nimotop  for vasospasm ppx - TCDs per protocol - Goal SBP 140-160  - con't home midodrine   - PRN  hydralazine  - PT/OT/SLP as able - pain control - complaining of headache today. Neurology increasing frequency of oxycodone  and adding Fioricet   Ongoing fever Concern for neurogenic etiology vs infectious etiology. Elevated WBCs on CSF with high  RBCs. - Trend WBC, fever curve - fever curve downtrending  - EVD cultures negative; Bcx continue to be negative  - con't Ancef  until EVD is removed  - Scheduled APAP Q6H  ESRD on HD  AGMA (persistent) etiology not clear  Hx failed renal txp Hypercalcemia - Nephrology following, appreciate management - HD per Nephro - today  - Trend BMP, Phos - Replete electrolytes as indicated - Continue bicarb tabs - Monitor I&Os - Avoid nephrotoxic agents as able - Ensure adequate renal perfusion  Colitis with ongoing diarrhea Zosyn  completed (6/19). 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 - SSI - CBGs Q4H - Goal CBG 140-180 - Continue ISP (CSA, mycophenolate , prednisone ); pharmacy trying to confirm dosing of immunosuppressants   Best Practice (right click and Reselect all SmartList Selections daily)   Diet/type: tubefeeds and NPO  DVT prophylaxis prophylactic heparin   and ASA started 6/20 GI prophylaxis: N/A Lines: N/A - EVD and yes it is still needed  Foley:  N/A Code Status:  full code Last date of multidisciplinary goals of care discussion [Friend updated 6/12. Patient's family lives [place].]  Critical care time: 28    Tinnie FORBES Adolph DEVONNA [place] [facility] 02/27/24 7:39 AM  Please see [email or web address] for pager details.  From 7A-7P if no response, please call [phone] After hours, please call ELink [phone]

## 2024-02-27 NOTE — TOC Progression Note (Signed)
 Transition of Care [facility]) - Progression Note    Patient Details  Name: [name] [name] MRN: [id number] Date of Birth: [birth year]  Transition of Care [facility]) CM/SW Contact  Inocente GORMAN Kindle, LCSW Phone Number: 02/27/2024, 3:59 PM  Clinical Narrative:    CSW following for medical stability for SNF referrals. Cortrak in place.    Expected Discharge Plan: Skilled Nursing Facility Barriers to Discharge: Continued Medical Work up, English as a second language teacher, SNF Pending bed offer  Expected Discharge Plan and Services                                               Social Determinants of Health (SDOH) Interventions SDOH Screenings   Food Insecurity: Patient Unable To Answer (02/15/2024)  Housing: Unknown (02/15/2024)  Transportation Needs: Patient Unable To Answer (02/15/2024)  Utilities: Patient Unable To Answer (02/15/2024)  Social Connections: Unknown (10/04/2022)   Received from [facility]  Tobacco Use: Medium Risk (02/13/2024)    Readmission Risk Interventions    11/17/2021   12:15 PM  Readmission Risk Prevention Plan  Transportation Screening Complete  Medication Review (RN Care Manager) Complete  PCP or Specialist appointment within 3-5 days of discharge Complete  HRI or Home Care Consult Complete  SW Recovery Care/Counseling Consult Complete  Palliative Care Screening Not Applicable  Skilled Nursing Facility Patient Refused

## 2024-02-27 NOTE — Progress Notes (Signed)
 Transcranial Doppler study attempted twice (1500 & 1600).  Both times patient was off the floor in a procedure.  Will re attempt as patient availability permits.   02/27/2024 5:38 PM Jeyla Bulger RVT, RDMS

## 2024-02-27 NOTE — Progress Notes (Signed)
 OT Cancellation Note  Patient Details Name: [name] [name] MRN: [id number] DOB: May 19, 1981   Cancelled Treatment:    Reason Eval/Treat Not Completed: Patient at procedure or test/ unavailable (in room HD, also noted plans for angiogram at 3p today. OT to continue efforts for treatment as able.)  [name] Causey 02/27/2024, 8:45 AM

## 2024-02-27 NOTE — Sedation Documentation (Signed)
 Leg immobilizer applied

## 2024-02-27 NOTE — Progress Notes (Signed)
 Subjective: The patient is alert and asking appropriate questions.  Objective: Vital signs in last 24 hours: Temp:  [98.1 F (36.7 C)-99.6 F (37.6 C)] 98.2 F (36.8 C) (06/25 0400) Pulse Rate:  [93-120] 111 (06/25 0700) Resp:  [7-30] 16 (06/25 0700) BP: (106-193)/(53-92) 159/79 (06/25 0700) SpO2:  [90 %-100 %] 98 % (06/25 0700) Weight:  [55.1 kg] 55.1 kg (06/25 0500) Estimated body mass index is 18.74 kg/m as calculated from the following:   Height as of this encounter: 5' 7.5 (1.715 m).   Weight as of this encounter: 55.1 kg.   Intake/Output from previous day: 06/24 0701 - 06/25 0700 In: 769 [I.V.:79; NG/GT:640; IV Piggyback:50] Out: 818 [Drains:168; Stool:650] Intake/Output this shift: No intake/output data recorded.  Physical exam patient is alert and oriented.  She has bilateral cranial nerve VI palsies.  She is moving all 4 extremities well.   Lab Results: Recent Labs    02/25/24 0645  WBC 5.9  HGB 8.8*  HCT 26.9*  PLT 176   BMET Recent Labs    02/26/24 1545 02/27/24 0541  NA 137 136  K 5.4* 4.4  CL 88* 87*  CO2 22 19*  GLUCOSE 121* 120*  BUN 72* 92*  CREATININE 5.18* 6.11*  CALCIUM  11.0* 11.4*    Studies/Results: VAS US  TRANSCRANIAL DOPPLER Result Date: 02/26/2024  Transcranial Doppler Patient Name:  [name] [name]  Date of Exam:   02/26/2024 Medical Rec #: [id number]           Accession #:    [id number] Date of Birth: 10/14/1980            Patient Gender: F Patient Age:   43 years Exam Location:  [facility] Procedure:      VAS US  TRANSCRANIAL DOPPLER Referring Phys: ARY XU --------------------------------------------------------------------------------  Indications: Subarachnoid hemorrhage. Performing Technologist: Ricka Sturdivant-Jones RDMS, RVT  Examination Guidelines: A complete evaluation includes B-mode imaging, spectral Doppler, color Doppler, and power Doppler as needed of all accessible portions of each vessel. Bilateral testing is  considered an integral part of a complete examination. Limited examinations for reoccurring indications may be performed as noted.  +----------+---------------+----------+-----------+-------------+ RIGHT TCD Right VM (cm/s)Depth (cm)Pulsatility   Comment    +----------+---------------+----------+-----------+-------------+ MCA             197         4.30      1.08                  +----------+---------------+----------+-----------+-------------+ ACA             -42                   0.90                  +----------+---------------+----------+-----------+-------------+ Term ICA        48                    0.96                  +----------+---------------+----------+-----------+-------------+ PCA P1          -66                   1.05         P2       +----------+---------------+----------+-----------+-------------+ Opthalmic       24  1.67                  +----------+---------------+----------+-----------+-------------+ ICA siphon                                    not insonated +----------+---------------+----------+-----------+-------------+ Vertebral       -24                   1.08                  +----------+---------------+----------+-----------+-------------+ Distal ICA      26                    0.70                  +----------+---------------+----------+-----------+-------------+  +----------+--------------+----------+-----------+-------------+ LEFT TCD  Left VM (cm/s)Depth (cm)Pulsatility   Comment    +----------+--------------+----------+-----------+-------------+ MCA            146         5.00      1.04                  +----------+--------------+----------+-----------+-------------+ ACA            -46                   0.73                  +----------+--------------+----------+-----------+-------------+ Term ICA        43                   0.76                   +----------+--------------+----------+-----------+-------------+ PCA P1         -50                   1.03         P2       +----------+--------------+----------+-----------+-------------+ Opthalmic       27                   1.79                  +----------+--------------+----------+-----------+-------------+ ICA siphon                                   not insonated +----------+--------------+----------+-----------+-------------+ Vertebral      -21                   1.03                  +----------+--------------+----------+-----------+-------------+ Distal ICA      34                               1.03      +----------+--------------+----------+-----------+-------------+  +------------+-------+-------+             VM cm/sComment +------------+-------+-------+ Prox Basilar  -38          +------------+-------+-------+ +----------------------+---+ Right Lindegaard Ratio5.1 +----------------------+---+ +---------------------+---+ Left Lindegaard Ratio4.2 +---------------------+---+    Preliminary     Assessment/Plan: Status post subarachnoid hemorrhage, intraventricular hemorrhage, ventriculostomy day #13: The patient is improving.  An arteriogram is planned for later today.  Her ventriculostomy stopcock was closed I entered the room.  She is tolerating this well.  We will start elevating her ventriculostomy and perhaps we can get it out over the next few days.   LOS: 13 days     [name] 02/27/2024, 7:56 AM     Patient ID: [name], female   DOB: Sep 25, 1980, 43 y.o.   MRN: [id number]

## 2024-02-27 NOTE — Progress Notes (Signed)
  NEUROSURGERY PROGRESS NOTE   No issues overnight.   EXAM:  BP 132/62   Pulse 97   Temp (!) 97.2 F (36.2 C) (Axillary)   Resp (!) 22   Ht 5' 7.5 (1.715 m)   Wt 55.1 kg   LMP 02/13/2024 (Exact Date)   SpO2 94%   BMI 18.74 kg/m   Awake, alert, somewhat disoriented Speech fluent,   MAE well EVD in place   IMPRESSION:  43 y.o. female with prepontine SAH, initial CTA negative.  ESRD on HD MWF  PLAN: - Will attempt to proceed with diagnostic angiogram today  There is no family available at bedside or by telephone [place]. Pt unable to consent for herself. We will therefore proceed with the angiogram with implied emergent consent.   Gerldine Maizes, MD [facility]

## 2024-02-27 NOTE — Op Note (Signed)
 ENDOVASCULAR NEUROSURGERY OPERATIVE NOTE   PROCEDURE: Diagnostic Cerebral Angiogram   SURGEON:   Dr. Gerldine Maizes, MD  HISTORY:   The patient is a 43 y.o. yo female with a history of end-stage renal disease presenting to the hospital with subarachnoid hemorrhage.  Initial CT angiogram was negative.  Patient was not clinically able to tolerate diagnostic angiogram initially, however has improved from a neurologic standpoint.  We will therefore proceed with diagnostic cerebral angiogram.  Unfortunately the patient has no family available to provide consent, we therefore proceed with implied emergent consent.  APPROACH:   The patient was placed in the supine position on the angiography table and the skin of right groin prepped in the usual sterile fashion. The procedure was performed under local anesthesia (1%-solution of bicarbonate-bufferred Lidoacaine) and conscious sedation with Versed  and fentanyl  monitored by the in-suite nurse and myself, including non-invasive blood pressure and continuous pulse oxymetry.    Access to the right common femoral artery was obtained with a micropuncture needle using standard Seldinger technique.  A short 5 French sheath was placed.  HEPARIN :  0 Units total.    CONTRAST AGENT:  See IR records   FLUOROSCOPY TIME:  See IR records    CATHETER(S) AND WIRE(S):    5-French JB-1 glidecatheter   0.035" glidewire    VESSELS CATHETERIZED:   Right internal carotid   Left internal carotid   Right vertebral   Left vertebral   Right common femoral  VESSELS STUDIED:   Right internal carotid, head Left internal carotid, head Left vertebral Right vertebral Right femoral  PROCEDURAL NARRATIVE:   A 5-Fr JB-1 terumo glide catheter was advanced over a 0.035 glidewire into the aortic arch. The above vessels were then sequentially catheterized and cervical/cerebral angiograms taken. After review of images, the catheter was removed without incident.     INTERPRETATION:   Right internal carotid, head:   Injection reveals the presence of a widely patent ICA, M1, and A1 segments and their branches. No aneurysms, arteriovenous malformations, or high flow fistulas are visualized.  There is mild to moderate vasospasm involving the supraclinoid right internal carotid artery and both the proximal and second segments of the middle cerebral and anterior cerebral arteries.  The parenchymal and venous phases are unremarkable. The venous sinuses are widely patent.    Left internal carotid, head:   Injection reveals the presence of a widely patent ICA, A1, and M1 segments and their branches. No aneurysms, arteriovenous malformations, or high flow fistulas are visualized.  There is moderate vasospasm involving the right A1 and A2 segments, and mild-moderate vasospasm involving the M2 segments on the left.  The parenchymal and venous phases are unremarkable. The venous sinuses are widely patent.    Left vertebral:   Injection reveals the presence of a widely patent vertebral artery. This leads to a widely patent basilar artery that terminates in bilateral P1. The basilar apex is normal.  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  No aneurysms, arteriovenous malformations, or high flow fistulas are visualized.  No posterior circulation vasospasm is seen.  The parenchymal and venous phases are normal. The venous sinuses are patent.    Right vertebral:    Normal vessel. No PICA aneurysm. See basilar description above.    Right femoral:    Normal vessel. No significant atherosclerotic disease. Arterial sheath in adequate position.   DISPOSITION:  Upon completion of the study, the femoral sheath was removed and hemostasis obtained using a 5-Fr Exoseal closure  device. Good proximal and distal lower extremity pulses were documented upon achievement of hemostasis. The procedure was well tolerated and no early complications were observed.  The patient was  transferred to the intensive care unit for further care.    IMPRESSION:  1. No intracranial aneurysms, AVM, or fistula seen to explain the patient's subarachnoid hemorrhage 2. There is relatively diffuse mild-moderate anterior circulation vasospasm as described above.    Gerldine Maizes, MD [facility]

## 2024-02-27 NOTE — Progress Notes (Signed)
 SLP Cancellation Note  Patient Details Name: [name] [name] MRN: [id number] DOB: 15-Dec-1980   Cancelled treatment:       Reason Eval/Treat Not Completed: Other (comment) (patient alert but very confused, unable to follow directions at this time. SLP will continue to follow.)  Norleen IVAR Blase, MA, CCC-SLP Speech Therapy

## 2024-02-27 NOTE — Progress Notes (Addendum)
 STROKE TEAM PROGRESS NOTE    SIGNIFICANT HOSPITAL EVENTS 6/11- ED for nausea, vomiting after dialysis 6/12- intubated, EVD placed  6/14 - extubated 6/17 - Acute left cerebellar infarct 6/19 - vasospasm seen on TCD 6/21 - CSF sampling from EVD with WBC 244 and RBC 19000 6/25-plan for angiogram later today  INTERIM HISTORY/SUBJECTIVE Patient rests with eyes open and moves all 4 extremities today.  She complains of pain but states she does not know where the pain is.  Artificial tears given due to incomplete closure of left eye. OBJECTIVE  CBC    Component Value Date/Time   WBC 5.9 02/25/2024 0645   RBC 2.79 (L) 02/25/2024 0645   HGB 8.8 (L) 02/25/2024 0645   HCT 26.9 (L) 02/25/2024 0645   PLT 176 02/25/2024 0645   MCV 96.4 02/25/2024 0645   MCH 31.5 02/25/2024 0645   MCHC 32.7 02/25/2024 0645   RDW 15.3 02/25/2024 0645   LYMPHSABS 1.2 02/20/2024 1125   MONOABS 0.7 02/20/2024 1125   EOSABS 0.0 02/20/2024 1125   BASOSABS 0.0 02/20/2024 1125    BMET    Component Value Date/Time   NA 136 02/27/2024 0541   K 4.4 02/27/2024 0541   CL 87 (L) 02/27/2024 0541   CO2 19 (L) 02/27/2024 0541   GLUCOSE 120 (H) 02/27/2024 0541   BUN 92 (H) 02/27/2024 0541   CREATININE 6.11 (H) 02/27/2024 0541   CALCIUM  11.4 (H) 02/27/2024 0541   GFRNONAA 8 (L) 02/27/2024 0541    IMAGING past 24 hours No results found.     Vitals:   02/27/24 1200 02/27/24 1205 02/27/24 1215 02/27/24 1300  BP: (!) 168/86 (!) 197/94 (!) 175/84 (!) 121/54  Pulse: (!) 112 (!) 113 100 98  Resp: 18 14 14 19   Temp: (!) 97.2 F (36.2 C)  (!) 97.2 F (36.2 C)   TempSrc: Axillary  Axillary   SpO2: 96% 96% 94% 91%  Weight:   55.1 kg   Height:         PHYSICAL EXAM General: Alert, ill-appearing patient, NAD CV: ST, 110s Respiratory: room air, unlabored  NEURO:  Patient is alert and moves all 4 extremities spine continuously.  She will make sounds, response to her name and answers questions with I do not  know eyes are disconjugate with bilateral 6th nerve palsy noted, left eye does not close completely, left facial droop noted, EVD is in place  ASSESSMENT/PLAN  [name] is a 43 y.o. female with history of hypertension, ESRD on HD, s/p renal and pancreatic transplant, diabetes mellitus type 1 with gastroparesis presents with nausea and vomiting since after dialysis.  NIH on Admission 26  Perimesencephalic SAH with IVH - hypertensive vs. Prepontine venous anomaly not seen on angiogram Code Stroke CT head - Hemorrhage involving the prepontine cistern extending into the upper cervical spinal canal. Intraventricular blood products layering in the occipital horns.  CTA head & neck - No large vessel occlusion. No high-grade stenosis, aneurysm, or dissection of the arteries in the head and neck. Mild stenosis of the V4 segments of both vertebral arteries. Mild stenosis of the right supraclinoid ICA.  6/12 repeat CT Head- Interval placement of a right frontal approach ventriculostomy with tip in the body of the right lateral ventricle. Persistent but relatively stable hydrocephalus at this time. Moderate volume acute subarachnoid hemorrhage with associated IVH, similar to prior. No evidence for significant interval bleeding since prior. MRI  Small acute anterior corpus callosum infarct Mildly improved hydrocephalus  with EVD in place Decreased perimesencephalic subarachnoid hemorrhage and IVH Trace supratentorial and infratentorial subdural hemorrhage Age advanced chronic small vessel ischemic disease with chronic lacunar infarcts Repeat CT 6/18: Stable multicompartment acute hemorrhage.  No progressive mass effect or new acute hemorrhage.  Stable ventricular size with right frontal approach. 2D Echo EF 60-65% Cerebral angiogram showed relatively diffuse mild-moderate anterior circulation vasospasm  LDL 63 HgbA1c 4.2 VTE prophylaxis - heparin  suq No antithrombotic prior to admission, now  on No antithrombotic  Therapy recommendations:  CIR Disposition:  Pending   Vasospasm with left cerebellar infarct CTA head and neck 6/12 - mild irregularity of the BA without significant narrowing, concerning for mild vasospasm MRI 6/15 Small acute anterior corpus callosum infarct CTA head and neck 6/19 substantially decreased caliber of multiple intracranial arteries and bilateral circle-of-Willis branches (especially: Distal left V4, supraclinoid ICAs, MCA and ACA origins) when compared to 02/14/2024. The appearance most resembles a widespread Vasospasm, Vasoconstriction.  TCD monitoring  6/16: No definite evidence of vasospasm noted in identified blood vessels.  6/18: elevated mean flow velocities in right middle cerebral artery suggestive of moderate and left middle cerebral artery suggestive of mild vasospasm.  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  NSGY consulted Keppra  discontinued 6/17 EVD placement 6/12, continued per NS Plan to elevate EVD and perhaps remove over the next few days   Fever in the setting of EVD and immunosuppressant use Fever since 6/15, Tmax 101.7-102.2-102.9-101.7-103.1-102.5-101.2-100-99.3 WBC 9.4--8.8--7.5--5.9 On cyclosporine , cellcept  and prednisone  CSF sampling from EVD with WBC 244 and RBC 19000, culture NGTD On meropenem  and vanco -> ancef   Respiratory failure  Aspiration pneumonia PCCM primary Intubated 6/12, extubated 6/14 Continued on Zosyn  for aspiration and colitis, 8-day course--completed CXR 6/19: Improving pneumonia  Hypertensive Emergency  Now with Mild Induced Hypertension Home meds:  Norvasc , coreg , clonidine   patch, hydralazine   Stable on the high end Off Cleviprex , labetalol  PRN Now off levophed  (6/21) BP goal 140-160 Continue nimodipine   Long term BP goal normotensive  Diabetes Type 1 Hx of pancreas transplant  Chronically on CellCept , cyclosporine  and prednisone  HgbA1c 4.2, goal < 7.0 CBGs, SSI  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 Narcotics dependence Chronic pain Per pt she was using large dose of narcotics at home Resume home oxycodone  5mg  Q12h->Q8h Fioricet PRN Monitoring pain level  Other medical issues DM gastroparesis  Colitis, completed course of Zosyn  Thrombocytopenia, improved - platelet [id number] Additional Labs sent, B12, Folte, RPR, TSH, Ammonia--WNL  Hospital day # 13  Patient seen and examined by NP/APP with MD. MD to update note as needed.   Cortney E Everitt Clint Kill , MSN, AGACNP-BC [name] See Amion for schedule and pager information 02/27/2024 2:28 PM  ATTENDING NOTE: I reviewed above note and agree with the assessment and plan. Pt was seen and examined.   No family at the bedside. Pt is on HD this morning during round, however, she is crying for pain, not able  to pinpoint where exactly the pain location, seems to be diffuse, will increase oxycodone  to Q8h. Had cerebral angiogram showed diffuse mild to moderate vasospasm, will continue the BP augmentation and nimodipine . EVD tolerating well, will elevated the pressure and hopefully remove in the next a few days. Afebrile, and CSF culture negative. On Ancef .   For detailed assessment and plan, please refer to above as I have made changes wherever appropriate.   Neurology will  sign off. Please call with questions. Pt will follow up with stroke clinic Dr Rosemarie at [facility] in about 4 weeks. Thanks for the consult.   Ary Cummins, MD PhD Stroke Neurology 02/27/2024 9:33 PM

## 2024-02-28 ENCOUNTER — Other Ambulatory Visit (HOSPITAL_COMMUNITY)

## 2024-02-28 DIAGNOSIS — N186 End stage renal disease: Secondary | ICD-10-CM | POA: Diagnosis not present

## 2024-02-28 DIAGNOSIS — I609 Nontraumatic subarachnoid hemorrhage, unspecified: Secondary | ICD-10-CM | POA: Diagnosis not present

## 2024-02-28 DIAGNOSIS — Z992 Dependence on renal dialysis: Secondary | ICD-10-CM | POA: Diagnosis not present

## 2024-02-28 DIAGNOSIS — I161 Hypertensive emergency: Secondary | ICD-10-CM | POA: Diagnosis not present

## 2024-02-28 LAB — GLUCOSE, CAPILLARY
Glucose-Capillary: 108 mg/dL — ABNORMAL HIGH (ref 70–99)
Glucose-Capillary: 118 mg/dL — ABNORMAL HIGH (ref 70–99)
Glucose-Capillary: 125 mg/dL — ABNORMAL HIGH (ref 70–99)
Glucose-Capillary: 127 mg/dL — ABNORMAL HIGH (ref 70–99)
Glucose-Capillary: 129 mg/dL — ABNORMAL HIGH (ref 70–99)
Glucose-Capillary: 134 mg/dL — ABNORMAL HIGH (ref 70–99)
Glucose-Capillary: 97 mg/dL (ref 70–99)

## 2024-02-28 LAB — RENAL FUNCTION PANEL
Albumin: 2.8 g/dL — ABNORMAL LOW (ref 3.5–5.0)
Anion gap: 17 — ABNORMAL HIGH (ref 5–15)
BUN: 47 mg/dL — ABNORMAL HIGH (ref 6–20)
CO2: 27 mmol/L (ref 22–32)
Calcium: 10.8 mg/dL — ABNORMAL HIGH (ref 8.9–10.3)
Chloride: 92 mmol/L — ABNORMAL LOW (ref 98–111)
Creatinine, Ser: 3.63 mg/dL — ABNORMAL HIGH (ref 0.44–1.00)
GFR, Estimated: 15 mL/min — ABNORMAL LOW (ref >60)
Glucose, Bld: 82 mg/dL (ref 70–99)
Phosphorus: 7.2 mg/dL — ABNORMAL HIGH (ref 2.5–4.6)
Potassium: 4.2 mmol/L (ref 3.5–5.1)
Sodium: 136 mmol/L (ref 135–145)

## 2024-02-28 LAB — MAGNESIUM: Magnesium: 3 mg/dL — ABNORMAL HIGH (ref 1.7–2.4)

## 2024-02-28 MED ORDER — CHLORHEXIDINE GLUCONATE CLOTH 2 % EX PADS
6.0000 | MEDICATED_PAD | Freq: Every day | CUTANEOUS | Status: DC
  Administered 2024-02-29 – 2024-03-04 (×4): 6 via TOPICAL

## 2024-02-28 NOTE — Progress Notes (Signed)
 Physical Therapy Treatment Patient Details Name: [name] [name] MRN: [id number] DOB: 1981-02-08 Today's Date: 02/28/2024   History of Present Illness [name] [name] is a 46 female who presented to the ED 6/11 with n/v, abd pain after HD. 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 CRRT 6/13- 6/15. 6/12 EVD placed. S/p cerebral angiogram 6/25, showing no intracranial aneurysms, AVM, or fistula seen to explain the patient's subarachnoid hemorrhage, diffuse mild-moderate anterior circulation vasospasm.  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 Pt eager to participate once encouraged and awake. Pt requiring significant sequencing and physical assist to move to/from EOB. Once EOB, fluid noted to be dripping onto forehead from her EVD site. Further sitting and standing deferred and RN made aware immediately. This also happened in session on Tuesday, neurosurgery was made aware at that time. Pt progressing slowly.    If plan is discharge home, recommend the following: Assistance with cooking/housework;Assistance with feeding;Direct supervision/assist for medications management;Direct supervision/assist for financial management;Assist for transportation;Help with stairs or ramp for entrance;Supervision due to cognitive status;A lot of help with walking and/or transfers;A lot of help with bathing/dressing/bathroom   Can travel by private vehicle        Equipment Recommendations  Wheelchair cushion (measurements PT);Wheelchair (measurements PT);Hospital bed    Recommendations for Other Services       Precautions / Restrictions Precautions Precautions: Fall Recall of Precautions/Restrictions: Impaired Precaution/Restrictions Comments: EVD (RN clamped before session and unclamped after session), cortrak  (paused during session), RIJ, flexiseal Restrictions Weight Bearing Restrictions Per Provider Order: No     Mobility  Bed Mobility Overal bed mobility: Needs Assistance Bed Mobility: Rolling, Sidelying to Sit, Sit to Supine Rolling: Max assist, +2 for physical assistance, +2 for safety/equipment Sidelying to sit: Max assist, +2 for physical assistance, +2 for safety/equipment   Sit to supine: Max assist, +2 for physical assistance, +2 for safety/equipment   General bed mobility comments: increased assist levels due to low LOA and impaired cognition, step-by-step sequencing cues    Transfers                   General transfer comment: deferred due to EVD leaking in sitting    Ambulation/Gait                   Stairs             Wheelchair Mobility     Tilt Bed    Modified Rankin (Stroke Patients Only) Modified Rankin (Stroke Patients Only) Pre-Morbid Rankin Score: No symptoms Modified Rankin: Severe disability     Balance Overall balance assessment: Needs assistance Sitting-balance support: No upper extremity supported, Feet supported Sitting balance-Leahy Scale: Poor Sitting balance - Comments: R and L lateral elbow propping, requires min-mod assist for this and to maitain static upright sitting   Standing balance support: No upper extremity supported Standing balance-Leahy Scale: Poor                              Communication Communication Communication: Impaired Factors Affecting Communication: Reduced clarity of speech  Cognition Arousal: Lethargic Behavior During Therapy: Flat affect  PT - Cognition Comments: singing off and on during session, motivated to participate by ice cream (NPO except floor stock liquids, ice cream, applesauce allowed). dificulty following commands Following commands: Impaired Following commands impaired: Follows one step commands inconsistently    Cueing  Cueing Techniques: Verbal cues  Exercises      General Comments General comments (skin integrity, edema, etc.): Upon arrival, pt's dressing around her EVD was saturated. In sitting fluid was dripping into her eye. further sitting and standing deferred. RN made aware.      Pertinent Vitals/Pain Pain Assessment Pain Assessment: Faces Faces Pain Scale: No hurt Pain Intervention(s): Monitored during session    Home Living                          Prior Function            PT Goals (current goals can now be found in the care plan section) Acute Rehab PT Goals PT Goal Formulation: With patient Time For Goal Achievement: 03/05/24 Potential to Achieve Goals: Good Progress towards PT goals: Not progressing toward goals - comment (limited by EVD leaking)    Frequency    Min 3X/week      PT Plan      Co-evaluation   Reason for Co-Treatment: For patient/therapist safety;To address functional/ADL transfers PT goals addressed during session: Mobility/safety with mobility;Balance OT goals addressed during session: ADL's and self-care      AM-PAC PT 6 Clicks Mobility   Outcome Measure  Help needed turning from your back to your side while in a flat bed without using bedrails?: A Lot Help needed moving from lying on your back to sitting on the side of a flat bed without using bedrails?: A Lot Help needed moving to and from a bed to a chair (including a wheelchair)?: Total Help needed standing up from a chair using your arms (e.g., wheelchair or bedside chair)?: Total Help needed to walk in hospital room?: Total Help needed climbing 3-5 steps with a railing? : Total 6 Click Score: 8    End of Session   Activity Tolerance: Patient limited by fatigue;Other (comment) (EVD site leaking) Patient left: in bed;with call bell/phone within reach;with bed alarm set;with nursing/sitter in room (RN going back to room to unclamp EVD) Nurse Communication: Mobility status PT  Visit Diagnosis: Unsteadiness on feet (R26.81);Other abnormalities of gait and mobility (R26.89);Muscle weakness (generalized) (M62.81);Hemiplegia and hemiparesis Hemiplegia - Right/Left: Left Hemiplegia - dominant/non-dominant: Non-dominant Hemiplegia - caused by: Other Nontraumatic intracranial hemorrhage     Time: 8692-8664 PT Time Calculation (min) (ACUTE ONLY): 28 min  Charges:    $Therapeutic Activity: 8-22 mins PT General Charges $$ ACUTE PT VISIT: 1 Visit                     Johana RAMAN, PT DPT Acute Rehabilitation Services Secure Chat Preferred  Office [phone]    Johana FORBES [place] 02/28/2024, 2:37 PM

## 2024-02-28 NOTE — Progress Notes (Signed)
 [facility] Progress Note   Subjective:   Seen in room No c/o's  Objective Vitals:   02/28/24 1000 02/28/24 1100 02/28/24 1200 02/28/24 1213  BP: (!) 153/71 124/60 (!) 113/52   Pulse: (!) 102 97 (!) 103   Resp: 16 (!) 21 (!) 24   Temp:    98.4 F (36.9 C)  TempSrc:    Axillary  SpO2: 100% 100% 99%   Weight:      Height:       Physical Exam General: Ill appearing, NAD, holding L side of head Mittens in place Heart: Tachycardic, 3/6 murmur Lungs: CTA anteriorly Abdomen: soft Extremities: Trace BLE edema  Dialysis Access:  RUE AVF +t/b  OP HD:  MWF AF 3h  B400   57.2kg  2K   AVF  Heparin  none - Hectoral 4mcg IV q HD - Mircera 150mcg IV q 2 weeks (last given 6/9 -> due 6/23)   Assessment/Plan: Intracerebral hemorrhage: C/b hydrocephalus, 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 TCDs with evidence of vasospasm 02/11/24. Followed closely by neurosurgery.  Ongoing fevers: CSF culture 6/21 was negative, now on IV Ancef . BP/volume: Variable, off pressors as of 6/21. ESRD: MWF HD. S/p CRRT 6/12-6/12, now back to iHD. NO heparin . HD Friday.  Hyperkalemia: TFs changed to Nepro 6/22. K+ better, dc lokelma .  Anemia of ESRD: Hgb 8-10 here, stable. Consider start ESA if not getting better. Secondary HPTH:Ca and Phos high, VDRA stopped 6/21, add sevelamer via tube. Nutrition: NG tube in place, on nepro now AHRF: s/p course of Zosyn  for possible asp pna Pancreas/ kidney transplant (2014): for the pancreas, will resume her usual dosing of the CyA at 225mg  qam and 200 mg qpm. She has been underdosed here for about the last 2 wks. Will we get a level in 3-4 days after resuming the full dose.   Rob Reina Wilton  MD  [facility] 02/28/2024, 2:00 PM  Recent Labs  Lab 02/23/24 1630 02/24/24 9391 02/25/24 0645 02/25/24 0911 02/27/24 1802 02/28/24 0548  HGB 9.5*  --  8.8*  --   --   --   ALBUMIN  --    < >  --    < > 2.8* 2.8*  CALCIUM   --    < >  --    < > 10.1 10.8*   PHOS  --    < >  --    < > 6.0* 7.2*  CREATININE  --    < >  --    < > 2.86* 3.63*  K 4.7   < >  --    < > 4.7 4.2   < > = values in this interval not displayed.    Inpatient medications:  acetaminophen   650 mg Per Tube Q4H   Or   acetaminophen   650 mg Rectal Q4H   aspirin   81 mg Per Tube Daily   Chlorhexidine  Gluconate Cloth  6 each Topical Q0600   Chlorhexidine  Gluconate Cloth  6 each Topical Q0600   cycloSPORINE   200 mg Per Tube QHS   cycloSPORINE   225 mg Per Tube Daily   fiber  1 packet Per Tube QID   heparin  injection (subcutaneous)  5,000 Units Subcutaneous Q8H   insulin  aspart  0-9 Units Subcutaneous Q4H   midodrine   10 mg Per Tube Q8H   multivitamin  1 tablet Per Tube QHS   mycophenolate   500 mg Per Tube BID   niMODipine   60 mg Per Tube Q4H   mouth  rinse  15 mL Mouth Rinse 4 times per day   oxyCODONE   5 mg Per Tube Q8H   predniSONE   15 mg Per Tube Q breakfast   sevelamer carbonate  2.4 g Per Tube TID WC   sodium bicarbonate   1,300 mg Per Tube TID   sodium chloride  flush  3 mL Intravenous Q12H   sodium chloride  flush  3-10 mL Intravenous Q12H     ceFAZolin  (ANCEF ) IV Stopped (02/27/24 2148)   feeding supplement (NEPRO CARB STEADY) 45 mL/hr at 02/28/24 0700   albuterol , artificial tears, butalbital-acetaminophen -caffeine, Gerhardt's butt cream, hydrALAZINE , iohexol , labetalol , simethicone , trimethobenzamide         \

## 2024-02-28 NOTE — Progress Notes (Signed)
 SLP Cancellation Note  Patient Details Name: [name] [name] MRN: [id number] DOB: 1981/03/20   Cancelled treatment:       Reason Eval/Treat Not Completed: Pain, restlessness limiting ability to participate. D/W RN, helped to reposition. Will continue efforts.  Serenity Batley L. Vona, MA CCC/SLP Clinical Specialist - Acute Care SLP Acute Rehabilitation Services Office number [phone]    Vona Palma Laurice 02/28/2024, 2:28 PM

## 2024-02-28 NOTE — Progress Notes (Signed)
   Providing Compassionate, Quality Care - Together   Subjective: Nurse reports patient's EVD insertion site began draining CSF when EVD was clamped for the patient to work with therapy today. By report, this has happened one other time when working with therapy.  Objective: Vital signs in last 24 hours: Temp:  [97.2 F (36.2 C)-98.8 F (37.1 C)] 98.8 F (37.1 C) (06/26 0800) Pulse Rate:  [94-110] 97 (06/26 1100) Resp:  [10-26] 21 (06/26 1100) BP: (116-185)/(54-91) 124/60 (06/26 1100) SpO2:  [91 %-100 %] 100 % (06/26 1100) Weight:  [55.1 kg-57.5 kg] 57.5 kg (06/26 0500)  Intake/Output from previous day: 06/25 0701 - 06/26 0700 In: 1330 [NG/GT:1280; IV Piggyback:50] Out: 158 [Drains:123; Stool:35] Intake/Output this shift: Total I/O In: -  Out: 21 [Drains:21]  Oriented to self Pupils 3 round, sluggish Follows commands MAE, slightly weaker on the left EVD in place, draining  Lab Results: No results for input(s): WBC, HGB, HCT, PLT in the last 72 hours. BMET Recent Labs    02/27/24 1802 02/28/24 0548  NA 139 136  K 4.7 4.2  CL 95* 92*  CO2 26 27  GLUCOSE 169* 82  BUN 32* 47*  CREATININE 2.86* 3.63*  CALCIUM  10.1 10.8*    Studies/Results: No results found.  Assessment/Plan: Patient with SAH, IVH, hydrocephalus, vasospasm. EVD placed on 02/14/2024 by Dr. Mavis. Cerebral arteriogram performed by Dr. Lanis on 02/27/2024. No vascular abnormalities were identified. Mild anterior circulation vasospasm was seen.    LOS: 14 days   -Continue supportive efforts. -Maintain drain at its current level.  I am in communication with my attending and they agree with the plan for this patient.   Gerard Beck, DNP, AGNP-C Nurse Practitioner  [facility] [address], [place], [name] [address] P: [phone]    F: [phone]  02/28/2024, 12:10 PM

## 2024-02-28 NOTE — Progress Notes (Addendum)
 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 Hypertensive in ED SBP 240s. Went for CT a/p and was c/f some colitis. Started on abx, given IVF. C/o pain and was very agitated, rcvd pain meds. Admitted 6/12. Over the course of the day she became less responsive. Remained hypertensive w SBPs >200. Incr O2 req to [facility]. Pt was to rcv HD 6/12 afternoon but when eval by HD NP, concerns for airway protection. Rcvd narcan  with only modest improvement. PCCM consulted in this setting.  Pertinent Medical History:  Failed renal / pancreas txp ESRD  HTN DM1 gastroparesis   Significant Hospital Events: Including procedures, antibiotic start and stop dates in addition to other pertinent events   6/11 ED w n/v AMS. Abx IVF for colitis 6/12 Admit. AMS decr LOC. Incr O2 req. PCCM consult and code stroke called seen w rapid response nephro NP then neuro  6/13 Intubated with EVD, responding to commands  6/14 Extubated 6/18 Dialysis, fevering, EVD at 10 (129 mL last 24 hrs) 6/19 Narcotic dependence a driving factor. +Vasospasm on TCD. CTA with evolving Left cerebellar infarct, no HT or mass effect, substantially decreased caliber of multiple intracranial arteries and bilateral Circle-of[place] branches (especially: Distal left V4, supraclinoid ICAs, MCA and ACA origins), appearance c/w widespread Vasospasm, Vasoconstriction. No associated large vessel occlusion. 6/25: diagnostic angio today with nsgy - negative  Interim History / Subjective:  NAEON. Diagnostic angio negative. Calcium  elevated; neprho aware and on sevelamer via tube. Pending NSGY rec on elevating EVD and removal time.  Remove central line today.  Objective:  Blood pressure (!) 145/67, pulse (!) 103, temperature 98.8 F (37.1 C), temperature source Oral, resp. rate 20, height 5' 7.5 (1.715 m), weight 57.5 kg, last menstrual period 02/13/2024, SpO2 100%.        Intake/Output Summary (Last 24 hours) at 02/28/2024 0959 Last data filed at 02/28/2024 0900 Gross per 24 hour  Intake 1330 ml  Output 156 ml  Net 1174 ml   Filed Weights   02/27/24 0805 02/27/24 1215 02/28/24 0500  Weight: 55.1 kg 55.1 kg 57.5 kg   Physical Examination: General: Chronically ill-appearing female, sitting in bed, nad  HEENT: Anicteric sclera, pupils 1-39mm, reactive; mucous membranes dry. EVD in place. Neuro: Awake, verbally responsive Dysconjugate gaze, CN6 palsy. Moving all extremities but does not consistently follow commands CV: Tachycardic to 100s, III/VI systolic murmur. PULM: Breathing even and unlabored on RA. Lung fields CTAB. GI: Soft, nontender, nondistended. Extremities: Trace symmetric BLE edema noted. RUE AVF with +thrill.  Resolved problem List:  Hypertensive emergency  Acute respiratory failure w hypoxia Aspiration PNA (completed rx 6/19)  Assessment and Plan:   Large intracranial hemorrhage with brain compression, ICH score 4  Scattered SAH, Communicating hydrocephalus: EVD placement  Hx prior ischemic stroke w/ on-going decreased LOC Narcotics contributing to AMS and decreased LOC and as of 6/19 having evidence of vasospasm  - NSGY and Stroke following, appreciate assistance - Diagnostic angiogram 6/26 negative - EVD management per NSGY - pending rec on when to elevate and remove - Nimotop  for vasospasm ppx - TCDs per protocol - Goal SBP 140-160  - con't home midodrine   -  PRN hydralazine  - PT/OT/SLP as able - pain control   Ongoing fever, resolving Concern for neurogenic etiology vs infectious etiology. Elevated WBCs on CSF with high RBCs. - Trend WBC, fever curve - fever curve downtrending  -  EVD cultures negative; Bcx continue to be negative  - con't Ancef  until EVD is removed  - Scheduled APAP Q6H  ESRD on HD  AGMA (persistent) etiology not clear  Hx failed renal txp Hypercalcemia - Nephrology following, appreciate management - HD per Nephro  - Trend BMP, Phos - Replete electrolytes as indicated - Continue bicarb tabs, sevelamer  - Monitor I&Os - Avoid nephrotoxic agents as able - Ensure adequate renal perfusion  Colitis with ongoing diarrhea Zosyn  completed (6/19). 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 - SSI - CBGs Q4H - Goal CBG 140-180 - Continue ISP (CSA, mycophenolate , prednisone )  Best Practice (right click and Reselect all SmartList Selections daily)   Diet/type: tubefeeds and NPO  DVT prophylaxis prophylactic heparin   and ASA started 6/20 GI prophylaxis: N/A Lines: Central line and No longer needed.  Order written to d/c  - EVD and yes it is still needed  Foley:  N/A Code Status:  full code Last date of multidisciplinary goals of care discussion [Friend updated 6/12. Patient[place].]  Critical care time: 71    Tinnie FORBES Adolph DEVONNA [facility] 02/28/24 9:59 AM  Please see [email or web address] for pager details.  From 7A-7P if no response, please call [phone] After hours, please call Ema [phone]   Patient seen, independently examined, care plan formulated and discussed with RN arteries with documentation  Failed renal transplant patient, pancreatic transplant will was admitted with nausea and vomiting with abdominal pain Developed uncontrolled hypertension with hypertensive emergency with a subsequent subarachnoid hemorrhage, intracranial hemorrhage with brain compression Requiring EVD Chest CT angio today  She  is awake interactive, follows one-step commands Anicteric, pupils equal and reactive EVD in place Does have a systolic murmur Clear breath sounds Bowel sounds appreciated  I reviewed last 24 h vitals and pain scores, last 48 h intake and output, last 24 h labs and trends, and last 24 h imaging results.  Large intracranial hemorrhage with brain compression Subarachnoid hemorrhage, communicating hydrocephalus status post EVD placement - Continue Nimotop  for vasospasm - Goal SBP 140-160 - Continue home midodrine  - As needed hydralazine  - Pain management  Fever trend is better - On Ancef  - Plan to continue Ancef  until EVD removed  End-stage renal disease on hemodialysis Failed renal transplant - Continue dialysis schedule per nephrology - Avoid nephrotoxic medications  Colitis with ongoing diarrhea - Has rectal pouch in place - Fiber supplementation - Completed antibiotics for colitis  Type 1 diabetes History of pancreatic transplant Chronic immunosuppression - Continue SSI - Continue immunosuppressants   The patient is critically ill with multiple organ systems failure and requires high complexity decision making for assessment and support, frequent evaluation and titration of therapies, application of advanced monitoring technologies and extensive interpretation of multiple databases. Critical Care Time devoted to patient care services described in this note independent of APP/resident time (if applicable)  is 32 minutes.   Jennet Epley MD [place] Personal pager: See Amion If unanswered, please page CCM On-call: #[phone]

## 2024-02-28 NOTE — Progress Notes (Signed)
 Occupational Therapy Treatment Patient Details Name: [name] [name] MRN: [id number] DOB: 1980-09-10 Today's Date: 02/28/2024   History of present illness [name] [name] is a 40 female who presented to the ED 6/11 with n/v, abd pain after HD. 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 CRRT 6/13- 6/15. 6/12 EVD placed. 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 Pt is making limited progress towards their acute OT goals. Session complete with PT in hopes to progress OOB. On arrival pt's EVD dressing was noted to be saturated, upon sitting there was fluid leaking down her forehead. Pt immediately assisted back supine and RN made aware. While supported in bed, pt required hand over hand for feeding task. Pt followed <10% of simple commands and did not initiate any aspects of bed mobility. She continues to have dysconjugate gaze and tracking did not improve with unilateral occlusion - OT to continue efforts. OT to continue to follow acutely to facilitate progress towards established goals. Pt will continue to benefit from intensive inpatient follow up therapy, >3 hours/day after discharge.        If plan is discharge home, recommend the following:  A lot of help with walking and/or transfers;Two people to help with walking and/or transfers;A lot of help with bathing/dressing/bathroom;Two people to help with bathing/dressing/bathroom;Assistance with cooking/housework;Assistance with feeding;Direct supervision/assist for medications management;Direct supervision/assist for financial management;Assist for transportation;Supervision due to cognitive status;Help with stairs or ramp for entrance   Equipment Recommendations  Other (comment)    Recommendations for Other Services Rehab consult    Precautions / Restrictions  Precautions Precautions: Fall Recall of Precautions/Restrictions: Impaired Precaution/Restrictions Comments: EVD (RN clamped before session and unclamped after session), cortrak, RIJ, flexiseal Restrictions Weight Bearing Restrictions Per Provider Order: No       Mobility Bed Mobility Overal bed mobility: Needs Assistance Bed Mobility: Rolling, Sidelying to Sit, Sit to Supine Rolling: Max assist, +2 for physical assistance, +2 for safety/equipment Sidelying to sit: Max assist, +2 for physical assistance, +2 for safety/equipment   Sit to supine: Max assist, +2 for physical assistance, +2 for safety/equipment   General bed mobility comments: increased assist levels due to low LOA and impaired cognition    Transfers                   General transfer comment: deferred due to EVD leaking in sitting     Balance Overall balance assessment: Needs assistance Sitting-balance support: No upper extremity supported, Feet supported Sitting balance-Leahy Scale: Poor     Standing balance support: No upper extremity supported Standing balance-Leahy Scale: Poor                             ADL either performed or assessed with clinical judgement   ADL Overall ADL's : Needs assistance/impaired Eating/Feeding: Maximal assistance Eating/Feeding Details (indicate cue type and reason): required hand over hand for scooping ice cream and suuccessfully bringing it to her mouth Grooming: Maximal assistance                               Functional mobility during ADLs: Maximal assistance;+2 for physical assistance;+2 for safety/equipment General ADL Comments: minimal active participation. limited to bed level due to EVD leaking fluid    Extremity/Trunk Assessment Upper  Extremity Assessment Upper Extremity Assessment: RUE deficits/detail;LUE deficits/detail RUE Deficits / Details: able to reach top of head. Loosely hold spoon, does not attempt to correct  otientation of it LUE Deficits / Details: no real functional use of L throughout session   Lower Extremity Assessment Lower Extremity Assessment: Defer to PT evaluation        Vision   Vision Assessment?: Vision impaired- to be further tested in functional context Eye Alignment: Impaired (comment) Ocular Range of Motion: Impaired-to be further tested in functional context Tracking/Visual Pursuits: Decreased smoothness of horizontal tracking;Decreased smoothness of vertical tracking Saccades: Decreased speed of saccadic movement;Additional eye shifts occurred during testing;Impaired - to be further tested in functional context Convergence: Impaired (comment) Visual Fields: Impaired-to be further tested in functional context Additional Comments: pt continues to have dysconjugate gaze. R eye nystagmus noted when looking L. Attmpted occulding her eyes unilaterally for tracking, pt was unable to track wtih either eye (anticipate cognitive limitation)   Perception Perception Perception: Impaired   Praxis Praxis Praxis: Impaired Praxis Impairment Details: Organization   Communication Communication Communication: Impaired   Cognition Arousal: Lethargic Behavior During Therapy: Flat affect Cognition: Cognition impaired, Difficult to assess Difficult to assess due to: Level of arousal, Impaired communication Orientation impairments: Place, Time, Situation Awareness: Intellectual awareness impaired, Online awareness impaired Memory impairment (select all impairments): Short-term memory, Working Civil Service fast streamer, Conservation officer, historic buildings, Non-declarative long-term memory Attention impairment (select first level of impairment): Focused attention Executive functioning impairment (select all impairments): Initiation, Organization, Sequencing, Reasoning, Problem solving OT - Cognition Comments: pt needs loud/direct cues to faciliate any initiation or response to questions. Pt remains lethargic.  Little to no initiation for any functional tasks                 Following commands: Impaired Following commands impaired: Follows one step commands inconsistently      Cueing   Cueing Techniques: Verbal cues  Exercises      Shoulder Instructions       General Comments Upon arrival, pt's dressing around her EVD was saturated. In sitting fluid was dripping into her eye. further sitting and standing deferred. RN made aware.    Pertinent Vitals/ Pain       Pain Assessment Pain Assessment: Faces Faces Pain Scale: No hurt Pain Intervention(s): Monitored during session  Home Living                                          Prior Functioning/Environment              Frequency  Min 2X/week        Progress Toward Goals  OT Goals(current goals can now be found in the care plan section)  Progress towards OT goals: Progressing toward goals  Acute Rehab OT Goals Patient Stated Goal: to have ice cream OT Goal Formulation: Patient unable to participate in goal setting Time For Goal Achievement: 03/05/24 Potential to Achieve Goals: Good ADL Goals Pt Will Perform Grooming: with min assist Pt Will Perform Upper Body Dressing: with min assist Pt Will Transfer to Toilet: with mod assist;stand pivot transfer;bedside commode Additional ADL Goal #1: Pt will complete bed mobility with mod A as a precursor to ADLs  Plan      Co-evaluation    PT/OT/SLP Co-Evaluation/Treatment: Yes Reason for Co-Treatment: For patient/therapist safety;To address functional/ADL transfers PT goals addressed during session: Mobility/safety with mobility;Balance  OT goals addressed during session: ADL's and self-care      AM-PAC OT 6 Clicks Daily Activity     Outcome Measure   Help from another person eating meals?: Total Help from another person taking care of personal grooming?: A Lot Help from another person toileting, which includes using toliet, bedpan, or  urinal?: Total Help from another person bathing (including washing, rinsing, drying)?: Total Help from another person to put on and taking off regular upper body clothing?: Total Help from another person to put on and taking off regular lower body clothing?: Total 6 Click Score: 7    End of Session    OT Visit Diagnosis: Unsteadiness on feet (R26.81);Other abnormalities of gait and mobility (R26.89);Muscle weakness (generalized) (M62.81);Hemiplegia and hemiparesis Hemiplegia - Right/Left: Right   Activity Tolerance Patient tolerated treatment well;Patient limited by lethargy   Patient Left in bed;with call bell/phone within reach;with bed alarm set;with SCD's reapplied   Nurse Communication Mobility status (EVD leaking)        Time: 8692-8665 OT Time Calculation (min): 27 min  Charges: OT General Charges $OT Visit: 1 Visit OT Treatments $Therapeutic Activity: 8-22 mins  Lucie Kendall, OTR/L Acute Rehabilitation Services Office [phone] Secure Chat Communication Preferred   Lucie JONETTA Kendall 02/28/2024, 2:32 PM

## 2024-02-29 ENCOUNTER — Inpatient Hospital Stay (HOSPITAL_COMMUNITY)

## 2024-02-29 DIAGNOSIS — I619 Nontraumatic intracerebral hemorrhage, unspecified: Secondary | ICD-10-CM | POA: Diagnosis not present

## 2024-02-29 DIAGNOSIS — I609 Nontraumatic subarachnoid hemorrhage, unspecified: Secondary | ICD-10-CM

## 2024-02-29 DIAGNOSIS — N186 End stage renal disease: Secondary | ICD-10-CM | POA: Diagnosis not present

## 2024-02-29 DIAGNOSIS — R509 Fever, unspecified: Secondary | ICD-10-CM | POA: Diagnosis not present

## 2024-02-29 DIAGNOSIS — Z992 Dependence on renal dialysis: Secondary | ICD-10-CM | POA: Diagnosis not present

## 2024-02-29 LAB — RENAL FUNCTION PANEL
Albumin: 2.7 g/dL — ABNORMAL LOW (ref 3.5–5.0)
Anion gap: 27 — ABNORMAL HIGH (ref 5–15)
BUN: 70 mg/dL — ABNORMAL HIGH (ref 6–20)
CO2: 19 mmol/L — ABNORMAL LOW (ref 22–32)
Calcium: 11.5 mg/dL — ABNORMAL HIGH (ref 8.9–10.3)
Chloride: 90 mmol/L — ABNORMAL LOW (ref 98–111)
Creatinine, Ser: 5.58 mg/dL — ABNORMAL HIGH (ref 0.44–1.00)
GFR, Estimated: 9 mL/min — ABNORMAL LOW (ref >60)
Glucose, Bld: 109 mg/dL — ABNORMAL HIGH (ref 70–99)
Phosphorus: 9.2 mg/dL — ABNORMAL HIGH (ref 2.5–4.6)
Potassium: 4.5 mmol/L (ref 3.5–5.1)
Sodium: 136 mmol/L (ref 135–145)

## 2024-02-29 LAB — GLUCOSE, CAPILLARY
Glucose-Capillary: 111 mg/dL — ABNORMAL HIGH (ref 70–99)
Glucose-Capillary: 116 mg/dL — ABNORMAL HIGH (ref 70–99)
Glucose-Capillary: 117 mg/dL — ABNORMAL HIGH (ref 70–99)
Glucose-Capillary: 117 mg/dL — ABNORMAL HIGH (ref 70–99)
Glucose-Capillary: 125 mg/dL — ABNORMAL HIGH (ref 70–99)
Glucose-Capillary: 158 mg/dL — ABNORMAL HIGH (ref 70–99)

## 2024-02-29 LAB — MAGNESIUM: Magnesium: 3.4 mg/dL — ABNORMAL HIGH (ref 1.7–2.4)

## 2024-02-29 MED ORDER — DARBEPOETIN ALFA 60 MCG/0.3ML IJ SOSY
60.0000 ug | PREFILLED_SYRINGE | INTRAMUSCULAR | Status: DC
  Administered 2024-02-29 – 2024-03-14 (×3): 60 ug via SUBCUTANEOUS
  Filled 2024-02-29 (×3): qty 0.3

## 2024-02-29 NOTE — TOC Progression Note (Signed)
 Transition of Care [facility]) - Progression Note    Patient Details  Name: [name] MRN: [id number] Date of Birth: Jun 20, 1981  [facility] CM/SW Contact  Inocente GORMAN Kindle, LCSW Phone Number: 02/29/2024, 9:31 AM  Clinical Narrative:    CSW continuing to follow for medical stability.    Expected Discharge Plan: Skilled Nursing Facility Barriers to Discharge: Continued Medical Work up, English as a second language teacher, SNF Pending bed offer  Expected Discharge Plan and Services                                               Social Determinants of Health (SDOH) Interventions SDOH Screenings   Food Insecurity: Patient Unable To Answer (02/15/2024)  Housing: Unknown (02/15/2024)  Transportation Needs: Patient Unable To Answer (02/15/2024)  Utilities: Patient Unable To Answer (02/15/2024)  Social Connections: Unknown (10/04/2022)   Received from [facility]  Tobacco Use: Medium Risk (02/13/2024)    Readmission Risk Interventions    11/17/2021   12:15 PM  Readmission Risk Prevention Plan  Transportation Screening Complete  Medication Review (RN Care Manager) Complete  PCP or Specialist appointment within 3-5 days of discharge Complete  HRI or Home Care Consult Complete  SW Recovery Care/Counseling Consult Complete  Palliative Care Screening Not Applicable  Skilled Nursing Facility Patient Refused

## 2024-02-29 NOTE — Progress Notes (Signed)
 ranscranial Doppler   Date POD PCO2 HCT BP   MCA ACA PCA OPHT SIPH VERT Basilar  6/16 CK       130/59 Right  Left   *  13   *     -50 *     25 25     21  *     * -28     -18 -22       6/18 rs         Right  Left   130     110 -61     -53 54     26 19     19  *     * -42     -14 *        6/20 CK         Right  Left    131   24    *   *    -32   -27    20   27     *   *    *   *    *       6/24,rs            Right  Left    197   146    -42   -46    66   50    24   27    *   *    -24   -21    -38       6/27   JH     26.9  154/70  Right  Left    124   53    -27   -20    43   36    18   18    *   *    -15   -28    -27                 Right  Left                                                               Right  Left                                                     *Unable to insonate [place]ard Ratio:  RIGHT - 3.44  -LEFT - 1.71 MCA = Middle Cerebral Artery      OPHT = Opthalmic Artery     BASILAR = Basilar Artery   ACA = Anterior Cerebral Artery     SIPH = Carotid Siphon PCA = Posterior Cerebral Artery   VERT = Verterbral Artery                    Normal MCA = 62+\-12 ACA = 50+\-12 PCA = 42+\-23    Results can be found under chart review under CV PROC. 02/29/2024 5:20 PM Tariah Transue RVT, RDMS

## 2024-02-29 NOTE — Progress Notes (Signed)
 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 Hypertensive in ED SBP 240s. Went for CT a/p and was c/f some colitis. Started on abx, given IVF. C/o pain and was very agitated, rcvd pain meds. Admitted 6/12. Over the course of the day she became less responsive. Remained hypertensive w SBPs >200. Incr O2 req to HHFNC. Pt was to rcv HD 6/12 afternoon but when eval by HD NP, concerns for airway protection. Rcvd narcan  with only modest improvement. PCCM consulted in this setting.  Pertinent Medical History:  Failed renal / pancreas txp ESRD  HTN DM1 gastroparesis   Significant Hospital Events: Including procedures, antibiotic start and stop dates in addition to other pertinent events   6/11 ED w n/v AMS. Abx IVF for colitis 6/12 Admit. AMS decr LOC. Incr O2 req. PCCM consult and code stroke called seen w rapid response nephro NP then neuro  6/13 Intubated with EVD, responding to commands  6/14 Extubated 6/17 acute L cerebellar infarct. Keppra  dc  6/18 Dialysis, fevering, EVD at 10 (129 mL last 24 hrs) 6/19 Narcotic dependence a driving factor. +Vasospasm on TCD. CTA with evolving Left cerebellar infarct, no HT or mass effect, substantially decreased caliber of multiple intracranial arteries and bilateral [place] branches (especially: Distal left V4, supraclinoid ICAs, MCA and ACA origins), appearance c/w widespread Vasospasm, Vasoconstriction. No associated large vessel occlusion. 6/21 CSF sampline from EVD -- WBC 244 RBC 19000, cx ngtd  6/25: diagnostic angio today with nsgy - negative 6/26 evd leaking when clamped   Interim History / Subjective:   NAEO     Objective:  Blood pressure (!) 143/70, pulse 96, temperature 98.3 F (36.8 C), temperature source Axillary, resp. rate 19, height 5' 7.5 (1.715 m), weight 56.6 kg, last menstrual period 02/13/2024, SpO2 94%.        Intake/Output Summary (Last 24 hours) at 02/29/2024 1119 Last data filed at 02/29/2024 1000 Gross per 24 hour  Intake 1205 ml  Output 374 ml  Net 831 ml   Filed Weights   02/27/24 1215 02/28/24 0500 02/29/24 0500  Weight: 55.1 kg 57.5 kg 56.6 kg   Physical Examination: General: chronically ill middle aged F  Neuro: Awakens to stimulation. Lethargic. Oriented to self. Moving BUE BLE   HEENT: EVD in place  CV: rr s1s2  PULM: unlabored, slightly coarse  GI: soft ndnt  GU: defer  Extremities: edematous   Resolved problem List:  Hypertensive emergency  Acute respiratory failure w hypoxia Aspiration PNA (completed rx 6/19)  Assessment and Plan:    ICH w brain compression SAH, c/b vasospasm  Communicating hydrocephalus s/p EVD  L cerebellar infarct  Hx ischemic stroke  -dx angio 6/26 neg  P -NSGY plans for ventric removal Monday. If not tolerated well, will need VPS  -TCDs -nimotop   -SBP goal 140-160  - con't home midodrine   -PT/OT/SLP   Fever in immunocomp host Neurogenic v infectious. CSF cs 6/21 NGTD.  P -cont ancef  at least until EVD removed  ESRD on HD Hx failed Ktxp Hypercalcemia Hyperphosphatemia Hypermagnesemia  P -iHD per nephro  -bicarb tabs, sevelamer  -?if we need to address the ongoing rise w Ca  Hx DM1 S/p panc txp Chronic immunosuppression  P -cont cellcept , cyclosporine , pred  -SSI PRN   Diarrhea -c/f colitis on presenting imaging, has completed abx  -ongoing ouput may be more r/t EN  P -PRN imodium , fiber sup  -following lytes closely     Best Practice (right click and Reselect all SmartList Selections daily)   Diet/type: tubefeeds and NPO  DVT prophylaxis prophylactic heparin   and ASA started 6/20 GI  prophylaxis: N/A Lines: N/A - EVD and yes it is still needed  Foley:  N/A Code Status:  full code Last date of multidisciplinary goals of care discussion [Friend updated 6/12. Patient[place].]  CRITICAL CARE Performed by: Ronnald FORBES Gave   Total critical care time: 36 minutes  Critical care time was exclusive of separately billable procedures and treating other patients. Critical care was necessary to treat or prevent imminent or life-threatening deterioration.  Critical care was time spent personally by me on the following activities: development of treatment plan with patient and/or surrogate as well as nursing, discussions with consultants, evaluation of patient's response to treatment, examination of patient, obtaining history from patient or surrogate, ordering and performing treatments and interventions, ordering and review of laboratory studies, ordering and review of radiographic studies, pulse oximetry and re-evaluation of patient's condition.  Ronnald Gave MSN, AGACNP-BC [facility] Amion for pager  02/29/2024, 11:19 AM

## 2024-02-29 NOTE — Progress Notes (Signed)
 [facility] Progress Note   Subjective:   Seen in room   Objective Vitals:   02/29/24 1009 02/29/24 1100 02/29/24 1200 02/29/24 1300  BP: 132/63 (!) 153/80 (!) 143/72 (!) 154/70  Pulse: 91 89 87 91  Resp: 17 (!) 22 (!) 21 (!) 21  Temp:   98.8 F (37.1 C)   TempSrc:   Axillary   SpO2: 93% 94% 94% 95%  Weight:      Height:       Physical Exam General: Ill appearing, NAD, holding L side of head Mittens in place Heart: Tachycardic, 3/6 murmur Lungs: CTA anteriorly Abdomen: soft Extremities: Trace BLE edema  Dialysis Access:  RUE AVF +t/b  OP HD:  MWF AF 3h  B400   57.2kg  2K   AVF  Heparin  none - Hectoral 4mcg IV q HD - Mircera 150mcg IV q 2 weeks (last given 6/9 -> due 6/23)   Assessment/Plan: Intracerebral hemorrhage: C/b hydrocephalus, 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 TCDs with evidence of vasospasm 02/11/24. Followed closely by neurosurgery.  Ongoing fevers: CSF culture 6/21 was negative, now on IV Ancef . BP/volume: Variable, off pressors as of 6/21. ESRD: MWF HD. S/p CRRT 6/12-6/12, now back to iHD. NO heparin . HD today/tonight.  Hyperkalemia: TFs changed to Nepro 6/22. K+ better.  Anemia of ESRD: Hgb 8-10 here. Will start darbe at 60 micrograms /wk sq.  Secondary HPTH:Ca and Phos high, VDRA stopped 6/21, add sevelamer  via tube. Nutrition: NG tube in place, on nepro now AHRF: s/p course of Zosyn  for possible asp pna Pancreas/ kidney transplant (2014): for the pancreas, will resume her usual dosing of the CyA at 225mg  qam and 200 mg qpm. She has been underdosed here for about the last 2 wks.   Myer Fret  MD  [facility] 02/29/2024, 3:57 PM  Recent Labs  Lab 02/23/24 1630 02/24/24 9391 02/25/24 0645 02/25/24 0911 02/28/24 0548 02/29/24 0557  HGB 9.5*  --  8.8*  --   --   --   ALBUMIN  --    < >  --    < > 2.8* 2.7*  CALCIUM   --    < >  --    < > 10.8* 11.5*  PHOS  --    < >  --    < > 7.2* 9.2*  CREATININE  --    < >  --    < >  3.63* 5.58*  K 4.7   < >  --    < > 4.2 4.5   < > = values in this interval not displayed.    Inpatient medications:  acetaminophen   650 mg Per Tube Q4H   Or   acetaminophen   650 mg Rectal Q4H   aspirin   81 mg Per Tube Daily   Chlorhexidine  Gluconate Cloth  6 each Topical Q0600   cycloSPORINE   200 mg Per Tube QHS   cycloSPORINE   225 mg Per Tube Daily   fiber  1 packet Per Tube QID   heparin  injection (subcutaneous)  5,000 Units Subcutaneous Q8H   insulin  aspart  0-9 Units Subcutaneous Q4H   midodrine   10 mg Per Tube Q8H   multivitamin  1 tablet Per Tube QHS   mycophenolate   500 mg Per Tube BID   niMODipine   60 mg Per Tube Q4H   mouth rinse  15 mL Mouth Rinse 4 times per day   oxyCODONE   5 mg Per Tube Q8H   predniSONE   15 mg  Per Tube Q breakfast   sevelamer  carbonate  2.4 g Per Tube TID WC   sodium bicarbonate   1,300 mg Per Tube TID   sodium chloride  flush  3 mL Intravenous Q12H   sodium chloride  flush  3-10 mL Intravenous Q12H     ceFAZolin  (ANCEF ) IV Stopped (02/28/24 2217)   feeding supplement (NEPRO CARB STEADY) 45 mL/hr at 02/29/24 1200   albuterol , artificial tears, butalbital -acetaminophen -caffeine , Gerhardt's butt cream, hydrALAZINE , iohexol , labetalol , simethicone , trimethobenzamide         \

## 2024-02-29 NOTE — Progress Notes (Signed)
 Subjective: The patient is alert and interactive.  She is a bit confused.  Objective: Vital signs in last 24 hours: Temp:  [98.1 F (36.7 C)-98.8 F (37.1 C)] 98.5 F (36.9 C) (06/27 0400) Pulse Rate:  [87-105] 96 (06/27 0600) Resp:  [12-25] 19 (06/27 0600) BP: (113-162)/(52-80) 143/70 (06/27 0600) SpO2:  [92 %-100 %] 94 % (06/27 0600) Weight:  [56.6 kg] 56.6 kg (06/27 0500) Estimated body mass index is 19.25 kg/m as calculated from the following:   Height as of this encounter: 5' 7.5 (1.715 m).   Weight as of this encounter: 56.6 kg.   Intake/Output from previous day: 06/26 0701 - 06/27 0700 In: 1440 [NG/GT:1090; IV Piggyback:50] Out: 425 [Drains:90; Stool:335] Intake/Output this shift: Total I/O In: 635 [NG/GT:585; IV Piggyback:50] Out: 347 [Drains:47; Stool:300]  Physical exam the patient is alert and interactive.  She has bilateral cranial nerve VI palsies.  She is moving all 4 extremities well.  Lab Results: No results for input(s): WBC, HGB, HCT, PLT in the last 72 hours. BMET Recent Labs    02/27/24 1802 02/28/24 0548  NA 139 136  K 4.7 4.2  CL 95* 92*  CO2 26 27  GLUCOSE 169* 82  BUN 32* 47*  CREATININE 2.86* 3.63*  CALCIUM  10.1 10.8*    Studies/Results: No results found.  Assessment/Plan: Hydrocephalus, ventriculostomy: The patient leaks around her ventriculostomy when it is raised.  I will plan to remove her ventriculostomy on Monday.  If she does not tolerate this she will  need a shunt.  LOS: 15 days     [name] [name] 02/29/2024, 6:48 AM     Patient ID: [name] [name], female   DOB: Mar 17, 1981, 43 y.o.   MRN: [id number]

## 2024-02-29 NOTE — Progress Notes (Signed)
 Speech Language Pathology Treatment: Dysphagia;Cognitive-Linguistic  Patient Details Name: [name] [name] MRN: [id number] DOB: 12-30-1980 Today's Date: 02/29/2024 Time: 8979-8964 SLP Time Calculation (min) (ACUTE ONLY): 15 min  Assessment / Plan / Recommendation Clinical Impression  Pt is alert but needs frequent cueing for initiation and sustained attention throughout PO trials. Swallowing and ability to support herself nutritional appears to be mostly impacted by current cognitive status. Although she nods yes eagerly for POs offered, she does not verbalize or indicate preferences when given binary choices. She needs at least Mod cueing for initiation and sequencing of bolus acceptance, manipulation, and swallow initiation. She has no overt signs of aspiration though. Would continue to offer snacks of purees and thin liquids from the floor stock, and discussed plan with RN as pt is not likely to initiate requests herself. Hopeful that additional practice may help improve automaticity.    HPI HPI: Patient is a 43 y.o. female with PMH: ESRD on HD, s/p renal and pancreatic transplant, DM-1 with gastroparesis, anxiety, narcotic abuse, vision loss, non-compliance with medical treatment. She presented to the hospital on 02/14/24 with nausea and vomiting which started after HD with patient developing severe abdominal pain after completing nearly all of her HD session. In ED, she had temp of 95.9 degrees F with tachypnea, elevated BP and SpO2 as low as 89% despite being on HFNC. She was admitted with possible sepsis secondary to colitis, acute respiratory failure with hypoxia, acute metabolic encephalopathy. 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 CRRT 6/13-15, EVD placed on 6/12. She was emergently intubated on 6/12 and extubated on 6/14. She has been NPO awaiting SLP swallow evaluation.      SLP Plan  Continue with current plan of care          Recommendations  Diet  recommendations: Other(comment) (continue floor stock snacks of thin liquids, purees) Liquids provided via: Straw Medication Administration: Via alternative means Supervision: Trained caregiver to feed patient;Full supervision/cueing for compensatory strategies                  Oral care before and after PO;Oral care QID   Frequent or constant Supervision/Assistance Dysphagia, unspecified (R13.10)     Continue with current plan of care     Leita SAILOR., M.A. CCC-SLP Acute Rehabilitation Services Office: [phone]  Secure chat preferred   02/29/2024, 11:51 AM

## 2024-02-29 NOTE — Progress Notes (Signed)
 Nutrition Follow-up  DOCUMENTATION CODES:   Non-severe (moderate) malnutrition in context of chronic illness  INTERVENTION:   Continue TF via Cortrak: Nepro at 45 ml/h. Provides 1912 kcal, 87 gm protein, 785 ml free water  daily.  Continue renal MVI daily.  Consider d/c Nutrisource fiber, loose bowel movements ongoing with FMS in place. Current TF is providing 27 gm fiber daily.  NUTRITION DIAGNOSIS:   Moderate Malnutrition related to chronic illness (ESRD on HD/DM) as evidenced by mild fat depletion, mild muscle depletion.  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  S/P swallow therapy with SLP today. Plans to allow snacks of purees and thin liquids from floor stock. Diet remains NPO.   Continues to receive iHD MWF.  Cortrak in place with tip in the stomach: Nepro at 45 ml/h is providing 1912 kcal, 87 gm protein, 785 ml free water  daily. Patient is tolerating without difficulty per discussion with RN.  Labs reviewed. Phos 9.2, mag 3.4, K WNL at 4.5 CBG: [phone]  Medications reviewed and include nutrisource fiber, novolog , renal MVI, prednisone , renvela .  Admit weight 59.3 kg Current weight 56.6 kg EDW 57.2 kg  Ventriculostomy drain 90 ml output x 24 h Stool output 425 ml x 24 h via FMS I/O +3.053 L since admission  Diet Order:   Diet Order             Diet NPO time specified  Diet effective now                   EDUCATION NEEDS:   No education needs have been identified at this time  Skin:  Skin Assessment: Reviewed RN Assessment  Last BM:  6/27 rectal  tube  Height:   Ht Readings from Last 1 Encounters:  02/14/24 5' 7.5 (1.715 m)    Weight:   Wt Readings from Last 1 Encounters:  02/29/24 56.6 kg    BMI:  Body mass index is 19.25 kg/m.  Estimated Nutritional Needs:   Kcal:  1800-2000  Protein:  75-90 grams  Fluid:  1L + UOP   Suzen HUNT RD, LDN, CNSC Contact via secure chat. If unavailable, use group chat RD Inpatient.

## 2024-03-01 DIAGNOSIS — I161 Hypertensive emergency: Secondary | ICD-10-CM | POA: Diagnosis not present

## 2024-03-01 DIAGNOSIS — I609 Nontraumatic subarachnoid hemorrhage, unspecified: Secondary | ICD-10-CM | POA: Diagnosis not present

## 2024-03-01 DIAGNOSIS — Z992 Dependence on renal dialysis: Secondary | ICD-10-CM | POA: Diagnosis not present

## 2024-03-01 DIAGNOSIS — N186 End stage renal disease: Secondary | ICD-10-CM | POA: Diagnosis not present

## 2024-03-01 LAB — IRON AND TIBC
Iron: 79 ug/dL (ref 28–170)
Saturation Ratios: 35 % — ABNORMAL HIGH (ref 10.4–31.8)
TIBC: 225 ug/dL — ABNORMAL LOW (ref 250–450)
UIBC: 146 ug/dL

## 2024-03-01 LAB — FOLATE: Folate: 16.6 ng/mL (ref >5.9)

## 2024-03-01 LAB — CBC
HCT: 24.1 % — ABNORMAL LOW (ref 36.0–46.0)
Hemoglobin: 8.1 g/dL — ABNORMAL LOW (ref 12.0–15.0)
MCH: 31.2 pg (ref 26.0–34.0)
MCHC: 33.6 g/dL (ref 30.0–36.0)
MCV: 92.7 fL (ref 80.0–100.0)
Platelets: 207 10*3/uL (ref 150–400)
RBC: 2.6 MIL/uL — ABNORMAL LOW (ref 3.87–5.11)
RDW: 14.1 % (ref 11.5–15.5)
WBC: 4.8 10*3/uL (ref 4.0–10.5)
nRBC: 0 % (ref 0.0–0.2)

## 2024-03-01 LAB — BASIC METABOLIC PANEL WITH GFR
Anion gap: 17 — ABNORMAL HIGH (ref 5–15)
BUN: 24 mg/dL — ABNORMAL HIGH (ref 6–20)
CO2: 25 mmol/L (ref 22–32)
Calcium: 9.7 mg/dL (ref 8.9–10.3)
Chloride: 95 mmol/L — ABNORMAL LOW (ref 98–111)
Creatinine, Ser: 2.49 mg/dL — ABNORMAL HIGH (ref 0.44–1.00)
GFR, Estimated: 24 mL/min — ABNORMAL LOW (ref >60)
Glucose, Bld: 113 mg/dL — ABNORMAL HIGH (ref 70–99)
Potassium: 3.6 mmol/L (ref 3.5–5.1)
Sodium: 137 mmol/L (ref 135–145)

## 2024-03-01 LAB — GLUCOSE, CAPILLARY
Glucose-Capillary: 110 mg/dL — ABNORMAL HIGH (ref 70–99)
Glucose-Capillary: 126 mg/dL — ABNORMAL HIGH (ref 70–99)
Glucose-Capillary: 144 mg/dL — ABNORMAL HIGH (ref 70–99)
Glucose-Capillary: 138 mg/dL — ABNORMAL HIGH (ref 70–99)
Glucose-Capillary: 113 mg/dL — ABNORMAL HIGH (ref 70–99)
Glucose-Capillary: 103 mg/dL — ABNORMAL HIGH (ref 70–99)

## 2024-03-01 LAB — FERRITIN: Ferritin: 1658 ng/mL — ABNORMAL HIGH (ref 11–307)

## 2024-03-01 LAB — RETICULOCYTES
Immature Retic Fract: 2.2 % — ABNORMAL LOW (ref 2.3–15.9)
RBC.: 2.66 MIL/uL — ABNORMAL LOW (ref 3.87–5.11)
Retic Count, Absolute: 64.4 10*3/uL (ref 19.0–186.0)
Retic Ct Pct: 2.4 % (ref 0.4–3.1)

## 2024-03-01 LAB — VITAMIN B12: Vitamin B-12: 529 pg/mL (ref 180–914)

## 2024-03-01 LAB — MAGNESIUM: Magnesium: 2.4 mg/dL (ref 1.7–2.4)

## 2024-03-01 MED ORDER — LOPERAMIDE HCL 1 MG/7.5ML PO SUSP
2.0000 mg | ORAL | Status: DC | PRN
  Administered 2024-03-01: 2 mg
  Filled 2024-03-01: qty 15

## 2024-03-01 MED ORDER — LOPERAMIDE HCL 1 MG/7.5ML PO SUSP
2.0000 mg | Freq: Once | ORAL | Status: AC
  Administered 2024-03-01: 2 mg
  Filled 2024-03-01: qty 15

## 2024-03-01 NOTE — Progress Notes (Signed)
 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 Hypertensive in ED SBP 240s. Went for CT a/p and was c/f some colitis. Started on abx, given IVF. C/o pain and was very agitated, rcvd pain meds. Admitted 6/12. Over the course of the day she became less responsive. Remained hypertensive w SBPs >200. Incr O2 req to [facility]NC. Pt was to rcv HD 6/12 afternoon but when eval by HD NP, concerns for airway protection. Rcvd narcan  with only modest improvement. PCCM consulted in this setting.  Pertinent Medical History:  Failed renal / pancreas txp ESRD  HTN DM1 gastroparesis   Significant Hospital Events: Including procedures, antibiotic start and stop dates in addition to other pertinent events   6/11 ED w n/v AMS. Abx IVF for colitis 6/12 Admit. AMS decr LOC. Incr O2 req. PCCM consult and code stroke called seen w rapid response nephro NP then neuro  6/13 Intubated with EVD, responding to commands  6/14 Extubated 6/17 acute L cerebellar infarct. Keppra  dc  6/18 Dialysis, fevering, EVD at 10 (129 mL last 24 hrs) 6/19 Narcotic dependence a driving factor. +Vasospasm on TCD. CTA with evolving Left cerebellar infarct, no HT or mass effect, substantially decreased caliber of multiple intracranial arteries and bilateral Circle-of-Willis branches (especially: Distal left V4, supraclinoid ICAs, MCA and ACA origins), appearance c/w widespread Vasospasm, Vasoconstriction. No associated large vessel occlusion. 6/21 CSF sampline from EVD -- WBC 244 RBC 19000, cx ngtd  6/25: diagnostic angio today with nsgy - negative 6/26 evd leaking when clamped   Interim History / Subjective:   No acute  overnight events arousable, Tries to follow commands  Objective:  Blood pressure (!) 165/82, pulse 96, temperature 98.8 F (37.1 C), temperature source Oral, resp. rate 15, height 5' 7.5 (1.715 m), weight 56.6 kg, last menstrual period 02/13/2024, SpO2 95%.        Intake/Output Summary (Last 24 hours) at 03/01/2024 1442 Last data filed at 03/01/2024 1400 Gross per 24 hour  Intake 1230 ml  Output 594 ml  Net 636 ml   Filed Weights   02/28/24 0500 02/29/24 0500 03/01/24 0100  Weight: 57.5 kg 56.6 kg 56.6 kg   Physical Examination: Middle-age, does not appear to be in distress EVD in place Awake, tries to interact S1-S2 appreciated Clear breath sounds Bowel sounds appreciated Mild extremity edema  I reviewed last 24 h vitals and pain scores, last 48 h intake and output, last 24 h labs and trends, and last 24 h imaging results.  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 Ischemic stroke - For EVD removal on Monday - Transcranial Dopplers - Nimotop  - Blood pressure goal of 140-160 - Continue home midodrine   End-stage renal disease on hemodialysis - History of failed transplant - Appreciate nephrology follow-up - On IHD  Diarrhea related to colitis - Completed antibiotics - On loperamide  for diarrhea  Chronic immunosuppression with CellCept , cyclosporine , prednisone  Diabetes S/p pancreatic transplant  Continue Ancef  while EVD in place   Best Practice (right  click and Reselect all SmartList Selections daily)   Diet/type: tubefeeds and NPO  DVT prophylaxis prophylactic heparin   and ASA started 6/20 GI prophylaxis: N/A Lines: N/A - EVD and yes it is still needed  Foley:  N/A Code Status:  full code Last date of multidisciplinary goals of care  discussion [Friend updated 6/12. Patient's family lives [place].]  The patient is critically ill with multiple organ systems failure and requires high complexity decision making for assessment and support, frequent evaluation and titration of therapies, application of advanced monitoring technologies and extensive interpretation of multiple databases. Critical Care Time devoted to patient care services described in this note independent of APP/resident time (if applicable)  is 32 minutes.   Jennet Epley MD [facility] Personal pager: See Amion If unanswered, please page CCM On-call: #[phone]

## 2024-03-01 NOTE — Progress Notes (Signed)
 [facility] Progress Note   Subjective:   Seen in room No new changes   Objective Vitals:   03/01/24 0630 03/01/24 0700 03/01/24 0800 03/01/24 0900  BP: (!) 175/90 (!) 170/73 (!) 157/70 (!) 149/63  Pulse: (!) 102 98 99 (!) 103  Resp: (!) 33 (!) 24 10 13   Temp:   98.5 F (36.9 C)   TempSrc:   Oral   SpO2: 92% 93% 96% 94%  Weight:      Height:       Physical Exam General: Ill appearing, NAD, holding L side of head Mittens in place Heart: Tachycardic, 3/6 murmur Lungs: CTA anteriorly Abdomen: soft Extremities: Trace BLE edema  Dialysis Access:  RUE AVF +t/b  OP HD:  MWF AF 3h  B400   57.2kg  2K   AVF  Heparin  none - Hectoral 4mcg IV q HD - Mircera 150mcg IV q 2 weeks (last given 6/9 -> due 6/23)   Assessment/Plan: Intracerebral hemorrhage: C/b hydrocephalus, 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 TCDs with evidence of vasospasm 02/11/24. Followed closely by neurosurgery. Ventriculostomy to be d/c'd on Monday.  Ongoing fevers: CSF culture 6/21 was negative, now on IV Ancef . BP/volume: Variable, off pressors as of 6/21. ESRD: MWF HD. S/p CRRT 6/12-6/12, now back to iHD. NO heparin . Next HD Monday.  Hyperkalemia: TFs changed to Nepro 6/22. K+ better.  Anemia of ESRD: Hgb 8-10 here. Started darbe at 60 micrograms sq weekly.  Secondary HPTH:Ca and Phos high, VDRA stopped 6/21, add sevelamer  via tube. Nutrition: NG tube in place, on nepro now AHRF: s/p course of Zosyn  for asp pna Pancreas/ kidney transplant (2014): for the pancreas, we resumed her usual dosing of the CyA at 225mg  qam and 200 mg qpm. She had been underdosed here for about 2 wks.   Myer Fret  MD  [facility] 03/01/2024, 10:58 AM  Recent Labs  Lab 02/25/24 0645 02/25/24 0911 02/28/24 0548 02/29/24 0557 03/01/24 0627  HGB 8.8*  --   --   --  8.1*  ALBUMIN  --    < > 2.8* 2.7*  --   CALCIUM   --    < > 10.8* 11.5* 9.7  PHOS  --    < > 7.2* 9.2*  --   CREATININE  --    < > 3.63* 5.58*  2.49*  K  --    < > 4.2 4.5 3.6   < > = values in this interval not displayed.    Inpatient medications:  acetaminophen   650 mg Per Tube Q4H   Or   acetaminophen   650 mg Rectal Q4H   aspirin   81 mg Per Tube Daily   Chlorhexidine  Gluconate Cloth  6 each Topical Q0600   cycloSPORINE   200 mg Per Tube QHS   cycloSPORINE   225 mg Per Tube Daily   darbepoetin (ARANESP ) injection - DIALYSIS  60 mcg Subcutaneous Q Fri-1800   fiber  1 packet Per Tube QID   heparin  injection (subcutaneous)  5,000 Units Subcutaneous Q8H   insulin  aspart  0-9 Units Subcutaneous Q4H   midodrine   10 mg Per Tube Q8H   multivitamin  1 tablet Per Tube QHS   mycophenolate   500 mg Per Tube BID   niMODipine   60 mg Per Tube Q4H   mouth rinse  15 mL Mouth Rinse 4 times per day   oxyCODONE   5 mg Per Tube Q8H   predniSONE   15 mg Per Tube Q breakfast   sevelamer  carbonate  2.4 g Per Tube TID WC   sodium bicarbonate   1,300 mg Per Tube TID   sodium chloride  flush  3 mL Intravenous Q12H   sodium chloride  flush  3-10 mL Intravenous Q12H     ceFAZolin  (ANCEF ) IV Stopped (03/01/24 0150)   feeding supplement (NEPRO CARB STEADY) 45 mL/hr at 03/01/24 0600   albuterol , artificial tears, butalbital -acetaminophen -caffeine , Gerhardt's butt cream, hydrALAZINE , iohexol , labetalol , simethicone , trimethobenzamide         \

## 2024-03-01 NOTE — Progress Notes (Signed)
 Received patient in bed in 4N31 Alert, Slightly agitated, AxO x1-2 Informed consent verified with primary RN and in chart. TX duration: 3.5 hrs Patient tolerated well. HD Session completed. Hand-off given to patient's nurse.   Access used: Right AVF  Access issues: none Total UF removed:none (keep even) per order  Medication(s) given: none  Post HD VS: see table  Post HD weight: UTA      03/01/24 0528  Vitals  Temp 98 F (36.7 C)  Temp Source Axillary  BP (!) 164/77  MAP (mmHg) 102  BP Location Left Arm  BP Method Automatic  Patient Position (if appropriate) Lying  Pulse Rate 98  Pulse Rate Source Monitor  ECG Heart Rate 98  Resp 17  Oxygen  Therapy  SpO2 95 %  O2 Device Room Air  Patient Activity (if Appropriate) In bed  Pulse Oximetry Type Continuous  During Treatment Monitoring  Blood Flow Rate (mL/min) 0 mL/min  Arterial Pressure (mmHg) -0.61 mmHg  Venous Pressure (mmHg) -1.21 mmHg  TMP (mmHg) 17.77 mmHg  Ultrafiltration Rate (mL/min) 258 mL/min  Dialysate Flow Rate (mL/min) 299 ml/min  Dialysate Potassium Concentration 3  Dialysate Calcium  Concentration 2.5  Duration of HD Treatment -hour(s) 3.5 hour(s)  Cumulative Fluid Removed (mL) per Treatment  0.04  HD Safety Checks Performed Yes  Intra-Hemodialysis Comments Tx completed;Tolerated well  Post Treatment  Dialyzer Clearance Lightly streaked  Hemodialysis Intake (mL) 0 mL  Liters Processed 84  Fluid Removed (mL) 0 mL  Tolerated HD Treatment Yes  Post-Hemodialysis Comments HD Sesiion completed and tolerated.  AVG/AVF Arterial Site Held (minutes) 5 minutes  AVG/AVF Venous Site Held (minutes) 5 minutes  Note  Patient Observations Pt with intermittent mentation, axo 1-2, cooperative. Hd treatment completed and tolerated  Fistula / Graft Right Upper arm Arteriovenous fistula  Placement Date/Time: (c) 12/08/20 1403   Placed prior to admission: Yes  Orientation: Right  Access Location: Upper arm  Access Type:  Arteriovenous fistula  Site Condition No complications  Fistula / Graft Assessment Present;Thrill;Bruit  Status Deaccessed  Drainage Description None    [place] CHARLENA Montgomery, BSN, RN [facility]

## 2024-03-01 NOTE — Progress Notes (Signed)
  NEUROSURGERY PROGRESS NOTE   No issues overnight. HD completed this am.  EXAM:  BP (!) 157/70   Pulse 99   Temp 98.5 F (36.9 C) (Oral)   Resp 10   Ht 5' 7.5 (1.715 m)   Wt 56.6 kg   LMP 02/13/2024 (Exact Date)   SpO2 96%   BMI 19.25 kg/m   Awake, alert Answers simple questions, follows simple commands  CN grossly intact x left VI possible right VI palsy MAE well, symmetric EVD in place  IMPRESSION:  43 y.o. female s/p pos fossa SAH, CTA and angiogram negative. TCD velocities appear to be improving  PLAN: - Cont supportive care per PCCM/nephrology - Planning on d/c ventriculostomy Monday, unclear if she is truly shunt dependent.   Gerldine Maizes, MD [facility]

## 2024-03-02 DIAGNOSIS — Z992 Dependence on renal dialysis: Secondary | ICD-10-CM | POA: Diagnosis not present

## 2024-03-02 DIAGNOSIS — I609 Nontraumatic subarachnoid hemorrhage, unspecified: Secondary | ICD-10-CM | POA: Diagnosis not present

## 2024-03-02 DIAGNOSIS — N186 End stage renal disease: Secondary | ICD-10-CM | POA: Diagnosis not present

## 2024-03-02 DIAGNOSIS — I161 Hypertensive emergency: Secondary | ICD-10-CM | POA: Diagnosis not present

## 2024-03-02 LAB — CBC
HCT: 24.2 % — ABNORMAL LOW (ref 36.0–46.0)
Hemoglobin: 7.9 g/dL — ABNORMAL LOW (ref 12.0–15.0)
MCH: 30.9 pg (ref 26.0–34.0)
MCHC: 32.6 g/dL (ref 30.0–36.0)
MCV: 94.5 fL (ref 80.0–100.0)
Platelets: 194 10*3/uL (ref 150–400)
RBC: 2.56 MIL/uL — ABNORMAL LOW (ref 3.87–5.11)
RDW: 13.9 % (ref 11.5–15.5)
WBC: 3.8 10*3/uL — ABNORMAL LOW (ref 4.0–10.5)
nRBC: 0 % (ref 0.0–0.2)

## 2024-03-02 LAB — BASIC METABOLIC PANEL WITH GFR
Anion gap: 24 — ABNORMAL HIGH (ref 5–15)
BUN: 45 mg/dL — ABNORMAL HIGH (ref 6–20)
CO2: 22 mmol/L (ref 22–32)
Calcium: 10.5 mg/dL — ABNORMAL HIGH (ref 8.9–10.3)
Chloride: 91 mmol/L — ABNORMAL LOW (ref 98–111)
Creatinine, Ser: 4.66 mg/dL — ABNORMAL HIGH (ref 0.44–1.00)
GFR, Estimated: 11 mL/min — ABNORMAL LOW (ref >60)
Glucose, Bld: 90 mg/dL (ref 70–99)
Potassium: 4.4 mmol/L (ref 3.5–5.1)
Sodium: 137 mmol/L (ref 135–145)

## 2024-03-02 LAB — GLUCOSE, CAPILLARY
Glucose-Capillary: 100 mg/dL — ABNORMAL HIGH (ref 70–99)
Glucose-Capillary: 122 mg/dL — ABNORMAL HIGH (ref 70–99)
Glucose-Capillary: 125 mg/dL — ABNORMAL HIGH (ref 70–99)
Glucose-Capillary: 86 mg/dL (ref 70–99)
Glucose-Capillary: 93 mg/dL (ref 70–99)
Glucose-Capillary: 95 mg/dL (ref 70–99)

## 2024-03-02 MED ORDER — PANCRELIPASE (LIP-PROT-AMYL) 10440-39150 UNITS PO TABS
20880.0000 [IU] | ORAL_TABLET | Freq: Once | ORAL | Status: DC
  Filled 2024-03-02: qty 2

## 2024-03-02 MED ORDER — SODIUM BICARBONATE 650 MG PO TABS: 650.0000 mg | ORAL_TABLET | Freq: Once | ORAL | Status: DC

## 2024-03-02 MED ORDER — CHLORHEXIDINE GLUCONATE CLOTH 2 % EX PADS
6.0000 | MEDICATED_PAD | Freq: Every day | CUTANEOUS | Status: DC
Start: 2024-03-03 — End: 2024-03-05
  Administered 2024-03-03 – 2024-03-05 (×3): 6 via TOPICAL

## 2024-03-02 MED ORDER — METOCLOPRAMIDE HCL 5 MG/ML IJ SOLN
10.0000 mg | Freq: Three times a day (TID) | INTRAMUSCULAR | Status: DC
  Administered 2024-03-02 – 2024-03-03 (×3): 10 mg via INTRAVENOUS
  Filled 2024-03-02 (×3): qty 2

## 2024-03-02 NOTE — Progress Notes (Signed)
 [facility] Progress Note   Subjective:   Seen in room No new changes   Objective Vitals:   03/02/24 1500 03/02/24 1600 03/02/24 1700 03/02/24 1800  BP: 129/62 (!) 149/61 138/89 134/66  Pulse: 86 84 88 86  Resp: 20 18 18 10   Temp:  98.5 F (36.9 C)    TempSrc:  Oral    SpO2: 99% 100% 100% 99%  Weight:      Height:       Physical Exam General: Ill appearing, NAD Mittens in place Heart: Tachycardic, 3/6 murmur Lungs: CTA anteriorly Abdomen: soft Extremities: Trace BLE edema  Dialysis Access:  RUE AVF +t/b  OP HD:  MWF AF 3h  B400   57.2kg  2K   AVF  Heparin  none - Hectoral 4mcg IV q HD - Mircera 150mcg IV q 2 weeks (last given 6/9 -> due 6/23)   Assessment/Plan: Intracerebral hemorrhage: C/b hydrocephalus, 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 TCDs with evidence of vasospasm 02/11/24. Followed closely by neurosurgery, plan is to d/c ventriculostomy on Monday.  Ongoing fevers: CSF culture 6/21 was negative, now on IV Ancef . BP/volume: bp's stable, UF 1-2 L w/ next HD Monday.  ESRD: MWF HD. S/p CRRT 6/12-6/12, now back to iHD. NO heparin . Next HD Monday.  Hyperkalemia: TFs changed to Nepro 6/22. K+ better.  Anemia of ESRD: Hgb 8-10 here. Started darbe at 60 micrograms sq weekly.  Secondary HPTH:Ca and Phos high, VDRA stopped 6/21, add sevelamer  via tube. Nutrition: NG tube in place, on nepro now AHRF: s/p course of Zosyn  for asp pna Pancreas/ kidney transplant (2014): for the pancreas, we resumed her usual dosing of the CyA at 225mg  qam and 200 mg qpm. She had been underdosed here for about 2 wks.   Myer Fret  MD  [facility] 03/02/2024, 6:13 PM  Recent Labs  Lab 02/28/24 0548 02/29/24 0557 03/01/24 0627 03/02/24 0721  HGB  --   --  8.1* 7.9*  ALBUMIN 2.8* 2.7*  --   --   CALCIUM  10.8* 11.5* 9.7 10.5*  PHOS 7.2* 9.2*  --   --   CREATININE 3.63* 5.58* 2.49* 4.66*  K 4.2 4.5 3.6 4.4    Inpatient medications:  acetaminophen   650 mg Per  Tube Q4H   Or   acetaminophen   650 mg Rectal Q4H   aspirin   81 mg Per Tube Daily   Chlorhexidine  Gluconate Cloth  6 each Topical Q0600   cycloSPORINE   200 mg Per Tube QHS   cycloSPORINE   225 mg Per Tube Daily   darbepoetin (ARANESP ) injection - DIALYSIS  60 mcg Subcutaneous Q Fri-1800   fiber  1 packet Per Tube QID   heparin  injection (subcutaneous)  5,000 Units Subcutaneous Q8H   insulin  aspart  0-9 Units Subcutaneous Q4H   lipase/protease/amylase)  20,880 Units Per Tube Once   And   sodium bicarbonate   650 mg Per Tube Once   metoCLOPramide  (REGLAN ) injection  10 mg Intravenous Q8H   midodrine   10 mg Per Tube Q8H   multivitamin  1 tablet Per Tube QHS   mycophenolate   500 mg Per Tube BID   niMODipine   60 mg Per Tube Q4H   mouth rinse  15 mL Mouth Rinse 4 times per day   oxyCODONE   5 mg Per Tube Q8H   predniSONE   15 mg Per Tube Q breakfast   sevelamer  carbonate  2.4 g Per Tube TID WC   sodium bicarbonate   1,300 mg Per Tube TID  sodium chloride  flush  3 mL Intravenous Q12H   sodium chloride  flush  3-10 mL Intravenous Q12H     ceFAZolin  (ANCEF ) IV Stopped (03/01/24 2235)   feeding supplement (NEPRO CARB STEADY) Stopped (03/01/24 1930)   albuterol , artificial tears, butalbital -acetaminophen -caffeine , Gerhardt's butt cream, hydrALAZINE , iohexol , labetalol , loperamide  HCl, simethicone , trimethobenzamide         \

## 2024-03-02 NOTE — Progress Notes (Signed)
  NEUROSURGERY PROGRESS NOTE   No issues overnight. Appears more awake this am. Per RN was much more interactive with her parents in [place] over [place].  EXAM:  BP (!) 159/74   Pulse 89   Temp 97.9 F (36.6 C) (Oral)   Resp 14   Ht 5' 7.5 (1.715 m)   Wt 56.5 kg   LMP 02/13/2024 (Exact Date)   SpO2 100%   BMI 19.22 kg/m   Awake, alert Answers questions, follows commands  CN grossly intact x bilateral VI palsies and left eye medial gaze MAE well, symmetric EVD in place, patent  IMPRESSION:  43 y.o. female s/p pos fossa SAH, CTA and angiogram negative. Neurologically stable  PLAN: - Cont supportive care per PCCM/nephrology - Planning on d/c ventriculostomy Monday, unclear if she is truly shunt dependent.   Gerldine Maizes, MD [facility]

## 2024-03-02 NOTE — Progress Notes (Signed)
 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 Hypertensive in ED SBP 240s. Went for CT a/p and was c/f some colitis. Started on abx, given IVF. C/o pain and was very agitated, rcvd pain meds. Admitted 6/12. Over the course of the day she became less responsive. Remained hypertensive w SBPs >200. Incr O2 req to HHFNC. Pt was to rcv HD 6/12 afternoon but when eval by HD NP, concerns for airway protection. Rcvd narcan  with only modest improvement. PCCM consulted in this setting.  Pertinent Medical History:  Failed renal / pancreas txp ESRD  HTN DM1 gastroparesis   Significant Hospital Events: Including procedures, antibiotic start and stop dates in addition to other pertinent events   6/11 ED w n/v AMS. Abx IVF for colitis 6/12 Admit. AMS decr LOC. Incr O2 req. PCCM consult and code stroke called seen w rapid response nephro NP then neuro  6/13 Intubated with EVD, responding to commands  6/14 Extubated 6/17 acute L cerebellar infarct. Keppra  dc  6/18 Dialysis, fevering, EVD at 10 (129 mL last 24 hrs) 6/19 Narcotic dependence a driving factor. +Vasospasm on TCD. CTA with evolving Left cerebellar infarct, no HT or mass effect, substantially decreased caliber of multiple intracranial arteries and bilateral Circle-of-Willis branches (especially: Distal left V4, supraclinoid ICAs, MCA and ACA origins), appearance c/w widespread Vasospasm, Vasoconstriction. No associated large vessel occlusion. 6/21 CSF sampline from EVD -- WBC 244 RBC 19000, cx ngtd  6/25: diagnostic angio today with nsgy - negative 6/26 evd leaking when clamped   Interim History / Subjective:   No acute  events overnight More interactive today Did have some episodes of vomiting and still complained of some nausea this morning  Objective:  Blood pressure (!) 159/74, pulse 89, temperature 97.9 F (36.6 C), temperature source Oral, resp. rate 14, height 5' 7.5 (1.715 m), weight 56.5 kg, last menstrual period 02/13/2024, SpO2 100%.        Intake/Output Summary (Last 24 hours) at 03/02/2024 1023 Last data filed at 03/02/2024 0900 Gross per 24 hour  Intake 633.75 ml  Output 723 ml  Net -89.25 ml   Filed Weights   02/29/24 0500 03/01/24 0100 03/02/24 0500  Weight: 56.6 kg 56.6 kg 56.5 kg   Physical Examination: Middle-age, does not appear to be in distress EVD in place Awake alert and interactive S1-S2 appreciated Clear breath sounds  I reviewed last 24 h vitals and pain scores, last 48 h intake and output, last 24 h labs and trends, and last 24 h imaging results.  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 Ischemic stroke - For EVD removal on Monday -Continue transcranial Dopplers - Continue type - Blood pressure goal 140-160 - On home midodrine   End-stage renal disease on hemodialysis - History of failed transplant -Nephrology continues to follow following with dialysis needs  Diarrhea related to colitis Did complete antibiotics - On loperamide   Nausea with vomiting - Will try Reglan   Chronic immunosuppression with CellCept , cyclosporine , prednisone   S/p pancreatic  transplant Diabetes - Continue SSI  Continue Ancef  while EVD in place  Best Practice (right click and Reselect all SmartList Selections daily)   Diet/type: tubefeeds  DVT prophylaxis prophylactic heparin   and ASA started 6/20 GI prophylaxis: N/A Lines: N/A - EVD and yes it is still  needed  Foley:  N/A Code Status:  full code Last date of multidisciplinary goals of care discussion [Friend updated 6/12. Patient's family lives [place].]  Jennet Epley, MD [facility] Pager: See Tracey

## 2024-03-03 ENCOUNTER — Encounter (HOSPITAL_COMMUNITY): Payer: Self-pay

## 2024-03-03 ENCOUNTER — Inpatient Hospital Stay (HOSPITAL_COMMUNITY)

## 2024-03-03 DIAGNOSIS — R110 Nausea: Secondary | ICD-10-CM | POA: Diagnosis not present

## 2024-03-03 DIAGNOSIS — G910 Communicating hydrocephalus: Secondary | ICD-10-CM | POA: Diagnosis not present

## 2024-03-03 DIAGNOSIS — I160 Hypertensive urgency: Secondary | ICD-10-CM | POA: Diagnosis not present

## 2024-03-03 DIAGNOSIS — I609 Nontraumatic subarachnoid hemorrhage, unspecified: Secondary | ICD-10-CM

## 2024-03-03 LAB — GLUCOSE, CAPILLARY
Glucose-Capillary: 105 mg/dL — ABNORMAL HIGH (ref 70–99)
Glucose-Capillary: 122 mg/dL — ABNORMAL HIGH (ref 70–99)
Glucose-Capillary: 85 mg/dL (ref 70–99)
Glucose-Capillary: 89 mg/dL (ref 70–99)
Glucose-Capillary: 93 mg/dL (ref 70–99)

## 2024-03-03 LAB — BASIC METABOLIC PANEL WITH GFR
Anion gap: 31 — ABNORMAL HIGH (ref 5–15)
BUN: 57 mg/dL — ABNORMAL HIGH (ref 6–20)
CO2: 17 mmol/L — ABNORMAL LOW (ref 22–32)
Calcium: 11.3 mg/dL — ABNORMAL HIGH (ref 8.9–10.3)
Chloride: 86 mmol/L — ABNORMAL LOW (ref 98–111)
Creatinine, Ser: 6.1 mg/dL — ABNORMAL HIGH (ref 0.44–1.00)
GFR, Estimated: 8 mL/min — ABNORMAL LOW (ref >60)
Glucose, Bld: 96 mg/dL (ref 70–99)
Potassium: 4.7 mmol/L (ref 3.5–5.1)
Sodium: 134 mmol/L — ABNORMAL LOW (ref 135–145)

## 2024-03-03 LAB — CBC
HCT: 25.6 % — ABNORMAL LOW (ref 36.0–46.0)
Hemoglobin: 8.3 g/dL — ABNORMAL LOW (ref 12.0–15.0)
MCH: 31.3 pg (ref 26.0–34.0)
MCHC: 32.4 g/dL (ref 30.0–36.0)
MCV: 96.6 fL (ref 80.0–100.0)
Platelets: 185 10*3/uL (ref 150–400)
RBC: 2.65 MIL/uL — ABNORMAL LOW (ref 3.87–5.11)
RDW: 14.3 % (ref 11.5–15.5)
WBC: 3.9 10*3/uL — ABNORMAL LOW (ref 4.0–10.5)
nRBC: 0.5 % — ABNORMAL HIGH (ref 0.0–0.2)

## 2024-03-03 LAB — LIPASE, BLOOD: Lipase: 26 U/L (ref 11–51)

## 2024-03-03 MED ORDER — METOCLOPRAMIDE HCL 5 MG/ML IJ SOLN
5.0000 mg | Freq: Three times a day (TID) | INTRAMUSCULAR | Status: DC
  Administered 2024-03-03 – 2024-03-15 (×36): 5 mg via INTRAVENOUS
  Filled 2024-03-03 (×36): qty 2

## 2024-03-03 MED ORDER — LIDOCAINE HCL (PF) 1 % IJ SOLN
INTRAMUSCULAR | Status: AC
  Filled 2024-03-03: qty 5

## 2024-03-03 NOTE — Progress Notes (Signed)
 Physical Therapy Treatment Patient Details Name: [name] [name] MRN: [id number] DOB: Jan 22, 1981 Today's Date: 03/03/2024   History of Present Illness 43 yo female who presented to the ED 6/11 with n/v, abd pain after HD. 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 CRRT 6/13- 6/15. Cerebral angiogram 6/25 shows no intracranial aneurysms, AVM, or fistula seen to explain the patient's subarachnoid hemorrhage, diffuse mild-moderate anterior circulation vasospasm. EVD 6/12-6/30. 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    PT Comments  Cortrak team at bedside upon arrival to room, pt appears disgruntled due to this and reports discomfort. Pt endorses headache-type pain during session, and cognition/arousal level waxes and wanes throughout session. Pt tolerating EOB seated balance and repeated transfers, overall requiring mod-max +2 assist at this time. Pt with elevated SBP >160 post-transfers, RN notified. Plan for post-acute rehab remains appropriate at this time, pt progressing slowly.    If plan is discharge home, recommend the following: Assistance with cooking/housework;Assistance with feeding;Direct supervision/assist for medications management;Direct supervision/assist for financial management;Assist for transportation;Help with stairs or ramp for entrance;Supervision due to cognitive status;A lot of help with walking and/or transfers;A lot of help with bathing/dressing/bathroom   Can travel by private vehicle        Equipment Recommendations  Wheelchair cushion (measurements PT);Wheelchair (measurements PT);Hospital bed    Recommendations for Other Services       Precautions / Restrictions Precautions Precautions: Fall Recall of Precautions/Restrictions: Impaired Precaution/Restrictions Comments: cortrak, EVD d/c 6/30 but reaches for site often, RIJ,  flexiseal Restrictions Weight Bearing Restrictions Per Provider Order: No     Mobility  Bed Mobility Overal bed mobility: Needs Assistance Bed Mobility: Rolling, Sidelying to Sit Rolling: Supervision Sidelying to sit: Min assist, +2 for physical assistance   Sit to supine: Mod assist, +2 for physical assistance   General bed mobility comments: pt spontaneously rolling L, light assist for trunk elevation and LE lowering off EOB supine>sit. mod +2 assist supine>sit for trunk and LE management, boost up in bed.    Transfers Overall transfer level: Needs assistance Equipment used: 2 person hand held assist Transfers: Sit to/from Stand Sit to Stand: Max assist, +2 safety/equipment           General transfer comment: assist for power up with facilitation at sacrum, rise, steadying. Stand x2 EOB, requires mod BLE blocking and pt fatigues ~10 seconds of standing. Lateral step x1 towards L, pt assuming wide BOS and increasingly unstedy, requiring sit assist.    Ambulation/Gait               General Gait Details: nt   Stairs             Wheelchair Mobility     Tilt Bed    Modified Rankin (Stroke Patients Only) Modified Rankin (Stroke Patients Only) Pre-Morbid Rankin Score: No symptoms Modified Rankin: Severe disability     Balance Overall balance assessment: Needs assistance Sitting-balance support: No upper extremity supported, Feet supported Sitting balance-Leahy Scale: Poor Sitting balance - Comments: min-mod truncal assist to maintain static upright   Standing balance support: No upper extremity supported Standing balance-Leahy Scale: Zero                              Communication Communication Communication: Impaired Factors Affecting Communication: Reduced clarity of speech;Other (comment) (expressive difficulties today)  Cognition Arousal: Lethargic Behavior During Therapy: Flat affect  PT - Cognitive impairments: No  family/caregiver present to determine baseline, Attention, Sequencing, Problem solving, Safety/Judgement                       PT - Cognition Comments: inconsistently following one-step commands, often requires repeated multimodal cuing. difficulty with expressing self, periods of perseveration on certain words today. Poor insight into deficits Following commands: Impaired Following commands impaired: Follows one step commands inconsistently    Cueing Cueing Techniques: Verbal cues, Gestural cues, Tactile cues, Visual cues  Exercises Other Exercises Other Exercises: seated balance intervention: finding midline with truncal facilitation, elbow prop>return to upright bilat, AP leaning    General Comments General comments (skin integrity, edema, etc.): SBP 169 post-transfer, RN notified. Other VSS on RA      Pertinent Vitals/Pain Pain Assessment Pain Assessment: Faces Faces Pain Scale: Hurts little more Pain Location: head (gestures towards head) Pain Descriptors / Indicators: Grimacing, Guarding Pain Intervention(s): Limited activity within patient's tolerance, Monitored during session, Repositioned    Home Living                          Prior Function            PT Goals (current goals can now be found in the care plan section) Acute Rehab PT Goals PT Goal Formulation: With patient Time For Goal Achievement: 03/05/24 Potential to Achieve Goals: Good Progress towards PT goals: Progressing toward goals    Frequency    Min 3X/week      PT Plan      Co-evaluation PT/OT/SLP Co-Evaluation/Treatment: Yes Reason for Co-Treatment: For patient/therapist safety;To address functional/ADL transfers;Necessary to address cognition/behavior during functional activity PT goals addressed during session: Mobility/safety with mobility;Balance        AM-PAC PT 6 Clicks Mobility   Outcome Measure  Help needed turning from your back to your side while in a  flat bed without using bedrails?: A Little Help needed moving from lying on your back to sitting on the side of a flat bed without using bedrails?: A Lot Help needed moving to and from a bed to a chair (including a wheelchair)?: A Lot Help needed standing up from a chair using your arms (e.g., wheelchair or bedside chair)?: A Lot Help needed to walk in hospital room?: Total Help needed climbing 3-5 steps with a railing? : Total 6 Click Score: 11    End of Session Equipment Utilized During Treatment: Gait belt Activity Tolerance: Patient limited by fatigue;Patient limited by lethargy Patient left: in bed;with call bell/phone within reach;with bed alarm set;with nursing/sitter in room Nurse Communication: Mobility status;Other (comment) (level of arousal, SBP response to activity) PT Visit Diagnosis: Muscle weakness (generalized) (M62.81);Other symptoms and signs involving the nervous system (R29.898)     Time: 8788-8760 PT Time Calculation (min) (ACUTE ONLY): 28 min  Charges:    $Neuromuscular Re-education: 8-22 mins PT General Charges $$ ACUTE PT VISIT: 1 Visit                    Johana RAMAN, PT DPT Acute Rehabilitation Services Secure Chat Preferred  Office [phone]    Kodey Xue E Stroup 03/03/2024, 2:03 PM

## 2024-03-03 NOTE — Progress Notes (Signed)
 Occupational Therapy Treatment Patient Details Name: [name] [name] MRN: [id number] DOB: May 14, 1981 Today's Date: 03/03/2024   History of present illness 43 yo female who presented to the ED 6/11 with n/v, abd pain after HD. 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 CRRT 6/13- 6/15. Cerebral angiogram 6/25 shows no intracranial aneurysms, AVM, or fistula seen to explain the patient's subarachnoid hemorrhage, diffuse mild-moderate anterior circulation vasospasm. EVD 6/12-6/30. 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   OT comments  Pt making slow progress toward goals, trailed partial occlusion glasses with medial half of R lens occluded, pt reports improvement, pointing to TV, pt tracks to midline with L eye but not any further laterally. Pt tolerates sitting EOB x10 min with rhythmic rocking movement. Pt with decr arousal, and ultimately needing up to  mod A for sitting balance at end of session. Pt needing max +2 for transfers with 2 person HHA.Pt presenting with impairments listed below,w ill follow acutely. Patient will benefit from continued inpatient follow up therapy, <3 hours/day to maximize safety/ind with ADL/functional mobility.       If plan is discharge home, recommend the following:  A lot of help with walking and/or transfers;Two people to help with walking and/or transfers;A lot of help with bathing/dressing/bathroom;Two people to help with bathing/dressing/bathroom;Assistance with cooking/housework;Assistance with feeding;Direct supervision/assist for medications management;Direct supervision/assist for financial management;Assist for transportation;Supervision due to cognitive status;Help with stairs or ramp for entrance   Equipment Recommendations  Other (comment) (defer)    Recommendations for Other Services PT consult    Precautions / Restrictions  Precautions Precautions: Fall Recall of Precautions/Restrictions: Impaired Precaution/Restrictions Comments: cortrak, EVD d/c 6/30 but reaches for site often, RIJ, flexiseal Restrictions Weight Bearing Restrictions Per Provider Order: No       Mobility Bed Mobility Overal bed mobility: Needs Assistance Bed Mobility: Rolling, Sidelying to Sit Rolling: Supervision Sidelying to sit: Min assist, +2 for physical assistance   Sit to supine: Mod assist, +2 for physical assistance        Transfers Overall transfer level: Needs assistance Equipment used: 2 person hand held assist Transfers: Sit to/from Stand Sit to Stand: Max assist, +2 safety/equipment                 Balance Overall balance assessment: Needs assistance Sitting-balance support: No upper extremity supported, Feet supported Sitting balance-Leahy Scale: Poor Sitting balance - Comments: min-mod truncal assist to maintain static upright   Standing balance support: No upper extremity supported Standing balance-Leahy Scale: Zero                             ADL either performed or assessed with clinical judgement   ADL Overall ADL's : Needs assistance/impaired     Grooming: Wash/dry face;Moderate assistance Grooming Details (indicate cue type and reason): HOH assist to bring hand to mouth/nose for wiping secretions                                    Extremity/Trunk Assessment Upper Extremity Assessment Upper Extremity Assessment: RUE deficits/detail;LUE deficits/detail RUE Deficits / Details: reaches to top of head for incision multiple times RUE Sensation: decreased proprioception RUE Coordination: decreased fine motor;decreased gross motor LUE Deficits / Details: no noted functional use during session, needs hand over hand assist to place on bed LUE Coordination:  decreased gross motor;decreased fine motor   Lower Extremity Assessment Lower Extremity Assessment: Defer to PT  evaluation        Vision   Vision Assessment?: Vision impaired- to be further tested in functional context Additional Comments: partial occluded R lens of glasses provided to pt during session, pt reoprts improvement, pointing toward TV, pt does not track past midline with L eye and R eye deviates medially throughout session, occlusion glasses doffed at end of session   Perception Perception Perception: Impaired   Praxis     Communication Communication Communication: Impaired Factors Affecting Communication: Reduced clarity of speech (perseveration with speech)   Cognition Arousal: Lethargic Behavior During Therapy: Flat affect Cognition: Cognition impaired, Difficult to assess Difficult to assess due to: Level of arousal, Impaired communication Orientation impairments: Place, Time, Situation Awareness: Intellectual awareness impaired, Online awareness impaired Memory impairment (select all impairments): Short-term memory, Working [employer] fast streamer, Conservation officer, historic buildings, Non-declarative long-term memory Attention impairment (select first level of impairment): Focused attention Executive functioning impairment (select all impairments): Initiation, Organization, Sequencing, Reasoning, Problem solving                   Following commands: Impaired Following commands impaired: Follows one step commands inconsistently      Cueing   Cueing Techniques: Verbal cues, Gestural cues, Tactile cues, Visual cues  Exercises      Shoulder Instructions       General Comments BP elevated during session, SBP 185 at highest with RN aware    Pertinent Vitals/ Pain       Pain Assessment Pain Assessment: Faces Pain Score: 4  Faces Pain Scale: Hurts little more Pain Location: head (gestures towards head) Pain Descriptors / Indicators: Grimacing, Guarding Pain Intervention(s): Limited activity within patient's tolerance, Monitored during session, Repositioned  Home Living                                           Prior Functioning/Environment              Frequency  Min 2X/week        Progress Toward Goals  OT Goals(current goals can now be found in the care plan section)  Progress towards OT goals: Progressing toward goals  Acute Rehab OT Goals Patient Stated Goal: did not state OT Goal Formulation: Patient unable to participate in goal setting Time For Goal Achievement: 03/05/24 Potential to Achieve Goals: Good ADL Goals Pt Will Perform Grooming: with min assist Pt Will Perform Upper Body Dressing: with min assist Pt Will Transfer to Toilet: with mod assist;stand pivot transfer;bedside commode Additional ADL Goal #1: Pt will complete bed mobility with mod A as a precursor to ADLs  Plan      Co-evaluation    PT/OT/SLP Co-Evaluation/Treatment: Yes Reason for Co-Treatment: For patient/therapist safety;To address functional/ADL transfers;Necessary to address cognition/behavior during functional activity PT goals addressed during session: Mobility/safety with mobility;Balance OT goals addressed during session: ADL's and self-care      AM-PAC OT 6 Clicks Daily Activity     Outcome Measure   Help from another person eating meals?: Total Help from another person taking care of personal grooming?: A Lot Help from another person toileting, which includes using toliet, bedpan, or urinal?: Total Help from another person bathing (including washing, rinsing, drying)?: Total Help from another person to put on and taking off regular upper body clothing?: Total Help  from another person to put on and taking off regular lower body clothing?: Total 6 Click Score: 7    End of Session Equipment Utilized During Treatment: Gait belt  OT Visit Diagnosis: Unsteadiness on feet (R26.81);Other abnormalities of gait and mobility (R26.89);Muscle weakness (generalized) (M62.81);Hemiplegia and hemiparesis Hemiplegia - Right/Left: Right   Activity  Tolerance Patient tolerated treatment well;Patient limited by lethargy   Patient Left in bed;with call bell/phone within reach;with bed alarm set   Nurse Communication Mobility status (BP)        Time: 8788-8760 OT Time Calculation (min): 28 min  Charges: OT General Charges $OT Visit: 1 Visit OT Treatments $Therapeutic Activity: 8-22 mins  Tracker Mance K, OTD, OTR/L SecureChat Preferred Acute Rehab [phone]   Laneta POUR Koonce 03/03/2024, 3:55 PM

## 2024-03-03 NOTE — Progress Notes (Signed)
 ranscranial Doppler   Date POD PCO2 HCT BP   MCA ACA PCA OPHT SIPH VERT Basilar  6/16 CK       130/59 Right  Left   *  13   *     -50 *     25 25     21  *     * -28     -18 -22       6/18 rs         Right  Left   130     110 -61     -53 54     26 19     19  *     * -42     -14 *        6/20 CK         Right  Left    131   24    *   *    -32   -27    20   27     *   *    *   *    *       6/24,rs            Right  Left    197   146    -42   -46    66   50    24   27    *   *    -24   -21    -38       6/27   JH     26.9  154/70  Right  Left    124   53    -27   -20    43   36    18   18    *   *    -15   -28    -27        6/30 GC         Right  Left    45   25    -37   -27    24   *    31   15    *   14    -15   -18    *                 Right  Left                                                     *Unable to insonate [place]ard Ratio:  RIGHT - 4.5  -LEFT - 1.08 MCA = Middle Cerebral Artery      OPHT = Opthalmic Artery     BASILAR = Basilar Artery   ACA = Anterior Cerebral Artery     SIPH = Carotid Siphon PCA = Posterior Cerebral Artery   VERT = Verterbral Artery                    Normal MCA = 62+\-12 ACA = 50+\-12 PCA = 42+\-23   Results can be found under chart review under CV PROC.  03/03/24 2:21 PM Cathlyn Collet RVT

## 2024-03-03 NOTE — TOC Progression Note (Addendum)
 Transition of Care [facility]) - Progression Note    Patient Details  Name: [name] [name] MRN: [id number] Date of Birth: 05-30-1981  [facility] CM/SW Contact  Inocente GORMAN Kindle, LCSW Phone Number: 03/03/2024, 10:06 AM  Clinical Narrative:    CSW continuing to follow. Cortrak in place, EVD removed.   CSW received call from patient's friend, Nori. She requested a letter for patient's parents to come to the [place] from [place]. She will retrieve needed info from them and let CSW know so CSW can email the letter to them.   CSW discussed SNF recommendation and Nakema confirmed that she will be patient's point of contact to assist in the SNF process once stable.    Expected Discharge Plan: Skilled Nursing Facility Barriers to Discharge: Continued Medical Work up, English as a second language teacher, SNF Pending bed offer  Expected Discharge Plan and Services                                               Social Determinants of Health (SDOH) Interventions SDOH Screenings   Food Insecurity: Patient Unable To Answer (02/15/2024)  Housing: Unknown (02/15/2024)  Transportation Needs: Patient Unable To Answer (02/15/2024)  Utilities: Patient Unable To Answer (02/15/2024)  Social Connections: Unknown (10/04/2022)   Received from [facility]  Tobacco Use: Medium Risk (02/13/2024)    Readmission Risk Interventions    11/17/2021   12:15 PM  Readmission Risk Prevention Plan  Transportation Screening Complete  Medication Review (RN Care Manager) Complete  PCP or Specialist appointment within 3-5 days of discharge Complete  HRI or Home Care Consult Complete  SW Recovery Care/Counseling Consult Complete  Palliative Care Screening Not Applicable  Skilled Nursing Facility Patient Refused

## 2024-03-03 NOTE — Progress Notes (Signed)
 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 Hypertensive in ED SBP 240s. Went for CT a/p and was c/f some colitis. Started on abx, given IVF. C/o pain and was very agitated, rcvd pain meds. Admitted 6/12. Over the course of the day she became less responsive. Remained hypertensive w SBPs >200. Incr O2 req to [facility]. Pt was to rcv HD 6/12 afternoon but when eval by HD NP, concerns for airway protection. Rcvd narcan  with only modest improvement. PCCM consulted in this setting.  Pertinent Medical History:  Failed renal / pancreas txp ESRD  HTN DM1 gastroparesis   Significant Hospital Events: Including procedures, antibiotic start and stop dates in addition to other pertinent events   6/11 ED w n/v AMS. Abx IVF for colitis 6/12 Admit. AMS decr LOC. Incr O2 req. PCCM consult and code stroke called seen w rapid response nephro NP then neuro  6/13 Intubated with EVD, responding to commands  6/14 Extubated 6/17 acute L cerebellar infarct. Keppra  dc  6/18 Dialysis, fevering, EVD at 10 (129 mL last 24 hrs) 6/19 Narcotic dependence a driving factor. +Vasospasm on TCD. CTA with evolving Left cerebellar infarct, no HT or mass effect, substantially decreased caliber of multiple intracranial arteries and bilateral Circle-of-Willis branches (especially: Distal left V4, supraclinoid ICAs, MCA and ACA origins), appearance c/w widespread Vasospasm, Vasoconstriction. No associated large vessel occlusion. 6/21 CSF sampline from EVD -- WBC 244 RBC 19000, cx ngtd  6/25: diagnostic angio today with nsgy - negative 6/26 evd leaking when clamped  6/30 evd removed  Interim History /  Subjective:   No acute events overnight. Continues to complain of nausea/vomiting TF held Reglan  started yesterday EVD coming out today.  Still complaining of headache.  Objective:  Blood pressure (!) 164/84, pulse 86, temperature 98.3 F (36.8 C), temperature source Oral, resp. rate 14, height 5' 7.5 (1.715 m), weight 56.5 kg, last menstrual period 02/13/2024, SpO2 100%.        Intake/Output Summary (Last 24 hours) at 03/03/2024 0802 Last data filed at 03/03/2024 0700 Gross per 24 hour  Intake 152.22 ml  Output 35 ml  Net 117.22 ml   Filed Weights   02/29/24 0500 03/01/24 0100 03/02/24 0500  Weight: 56.6 kg 56.6 kg 56.5 kg   Physical Examination: Middle-age, does not appear to be in distress EVD in place Awake alert and interactive S1-S2 appreciated Clear breath sounds  I reviewed last 24 h vitals and pain scores, last 48 h intake and output, last 24 h labs and trends, and last 24 h imaging results.  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 Ischemic stroke - EVD removed today. Will stop ancef  tomorrow.  - Continue transcranial Dopplers - Blood pressure goal 140-160 - On home midodrine  - BP very labile. High and low.   End-stage renal disease on hemodialysis - History of failed transplant -Nephrology continues to follow following with dialysis needs  Diarrhea related to colitis Did complete antibiotics - On loperamide   Nausea with vomiting -  Will try Reglan   Chronic immunosuppression with CellCept , cyclosporine , prednisone  - continue  S/p pancreatic transplant Diabetes - Continue SSI  Will observe in ICU for 24 hours following EVD removal.   Best Practice (right click and Reselect all SmartList Selections daily)   Diet/type: tubefeeds   DVT prophylaxis prophylactic heparin   and ASA started 6/20 GI prophylaxis: N/A Lines: N/A - EVD and yes it is still needed  Foley:  N/A Code Status:  full code Last date of multidisciplinary goals of care discussion [Friend updated 6/12. Patient's family [place].]   Critical care time 42 minutes  Deward Eastern, AGACNP-BC [facility]  See Amion for personal pager PCCM on call pager [phone] until 7pm. Please call Elink 7p-7a. [phone]  03/03/2024 8:09 AM

## 2024-03-03 NOTE — Progress Notes (Signed)
 Subjective: The patient remains alert and conversant.  She is mildly confused and in no apparent distress.  She continues to complain of a headache.  Objective: Vital signs in last 24 hours: Temp:  [96.9 F (36.1 C)-98.5 F (36.9 C)] 96.9 F (36.1 C) (06/30 1157) Pulse Rate:  [77-189] 78 (06/30 1500) Resp:  [8-19] 15 (06/30 1500) BP: (126-167)/(60-94) 149/73 (06/30 1500) SpO2:  [86 %-100 %] 99 % (06/30 1500) Estimated body mass index is 19.22 kg/m as calculated from the following:   Height as of this encounter: 5' 7.5 (1.715 m).   Weight as of this encounter: 56.5 kg.   Intake/Output from previous day: 06/29 0701 - 06/30 0700 In: 152.2 [P.O.:60; NG/GT:50; IV Piggyback:42.2] Out: 41 [Drains:41] Intake/Output this shift: Total I/O In: -  Out: 6 [Drains:6]  Physical exam the patient is alert and oriented x 2, person, [place].  The patient is moving all 4 extremities well.  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  Lab Results: Recent Labs    03/02/24 0721 03/03/24 0706  WBC 3.8* 3.9*  HGB 7.9* 8.3*  HCT 24.2* 25.6*  PLT 194 185   BMET Recent Labs    03/02/24 0721 03/03/24 0706  NA 137 134*  K 4.4 4.7  CL 91* 86*  CO2 22 17*  GLUCOSE 90 96  BUN 45* 57*  CREATININE 4.66* 6.10*  CALCIUM  10.5* 11.3*    Studies/Results: VAS US  TRANSCRANIAL DOPPLER Result Date: 03/03/2024  Transcranial Doppler Patient Name:  [name] [name]  Date of Exam:   03/03/2024 Medical Rec #: [id number]           Accession #:    [id number] Date of Birth: 14-Aug-1981            Patient Gender: F Patient Age:   43 years Exam Location:  [facility] Procedure:      VAS US  TRANSCRANIAL DOPPLER Referring Phys: ARY XU --------------------------------------------------------------------------------  Indications: Subarachnoid hemorrhage. Limitations: patient positioning Comparison Study: 02/18/2024, 02/20/2024, 02/22/2024, 02/26/2024, 02/29/2024 Performing Technologist: Cordella Collet RVT  Examination Guidelines: A complete evaluation includes B-mode imaging, spectral Doppler, color Doppler, and power Doppler as needed of all accessible portions of each vessel. Bilateral testing is considered an integral part of a complete examination. Limited examinations for reoccurring indications may be performed as noted.  +----------+---------------+----------+-----------+------------------+ RIGHT TCD Right VM (cm/s)Depth (cm)Pulsatility     Comment       +----------+---------------+----------+-----------+------------------+ MCA             45                    1.12                       +----------+---------------+----------+-----------+------------------+ ACA             -37                   1.59                       +----------+---------------+----------+-----------+------------------+ Term ICA        36                    1.54                       +----------+---------------+----------+-----------+------------------+ PCA P1          24  1.47                       +----------+---------------+----------+-----------+------------------+ Opthalmic       31                    2.01                       +----------+---------------+----------+-----------+------------------+ ICA siphon                                    Unable to insonate +----------+---------------+----------+-----------+------------------+ Vertebral       -15                    1.5                       +----------+---------------+----------+-----------+------------------+ Distal ICA      -10                   1.56                       +----------+---------------+----------+-----------+------------------+  +----------+--------------+----------+-----------+------------------+ LEFT TCD  Left VM (cm/s)Depth (cm)Pulsatility     Comment       +----------+--------------+----------+-----------+------------------+ MCA             25                   1.09                        +----------+--------------+----------+-----------+------------------+ ACA            -27                   2.13                       +----------+--------------+----------+-----------+------------------+ Term ICA                                     Unable to insonate +----------+--------------+----------+-----------+------------------+ PCA P1                                       Unable to insonate +----------+--------------+----------+-----------+------------------+ Opthalmic       15                   1.76                       +----------+--------------+----------+-----------+------------------+ ICA siphon      14                   1.57                       +----------+--------------+----------+-----------+------------------+ Vertebral      -18                   1.35                       +----------+--------------+----------+-----------+------------------+ Distal ICA     -23  1.39                       +----------+--------------+----------+-----------+------------------+  +------------+-------+------------------+             VM cm/s     Comment       +------------+-------+------------------+ Prox Basilar       Unable to insonate +------------+-------+------------------+ Dist Basilar       Unable to insonate +------------+-------+------------------+ +----------------------+---+ Right Lindegaard Ratio4.5 +----------------------+---+ +---------------------+----+ Left Lindegaard Ratio1.08 +---------------------+----+    Preliminary    DG Abd 1 View Result Date: 03/03/2024 CLINICAL DATA:  Feeding tube placement. EXAM: ABDOMEN - 1 VIEW COMPARISON:  02/14/2024. FINDINGS: Lymph nasogastric tube has been removed. Feeding to appears to be coiled in the gastric antrum. IMPRESSION: Feeding tube appears coiled in the gastric antrum. Electronically Signed   By: Newell Eke M.D.   On: 03/03/2024 09:19     Assessment/Plan: Subarachnoid hemorrhage, hydrocephalus, ventriculostomy: Whenever we raise the ventriculostomy the patient tolerates it but leaks around the shunt catheter.  I prepped this patient's skin and removed the ventriculostomy.  I closed the exit site with a figure-of-eight 2-0 nylon suture in place second suture along the previous track of the ventriculostomy.  The patient tolerated procedure well.  If she does not tolerate removal of the ventriculostomy.  She will need a VP shunt.  LOS: 18 days     [name] [name] 03/03/2024, 3:32 PM     Patient ID: [name] [name], female   DOB: 26-Apr-1981, 43 y.o.   MRN: [id number]

## 2024-03-03 NOTE — Procedures (Addendum)
 Cortrak  Person Inserting Tube:  Javian Nudd, Olivia SAUNDERS, RD Tube Type:  Cortrak - 43 inches Tube Size:  10 Tube Location:  Left nare Initial Placement:  Stomach Secured by: Bridle Technique Used to Measure Tube Placement:  Marking at nare/corner of mouth Cortrak Secured At:  72 cm   Cortrak Tube Team Note:  Consult received to advance gastric Cortrak feeding tube to post pyloric. 2 RDs attempted however, advancement unsuccessful.During placement, RD noticed current tube was clogged. RD replaced with new Cortrak feeding tube.   No x-ray is required. RN may begin using tube.   If the tube becomes dislodged please keep the tube and contact the Cortrak team at [email or web address] for replacement.  If after hours and replacement cannot be delayed, place a NG tube and confirm placement with an abdominal x-ray.    Olivia Kenning, RD Registered Dietitian  See Amion for more information

## 2024-03-03 NOTE — Progress Notes (Addendum)
 PCCM INTERVAL PROGRESS NOTE   Enteral nutrition limited by refractory nausea and vomiting. Persists despite PRN treatments and reglan . Cortrak is gastric and unable to be advanced by the cortrak team.   Will ask IR to advance tube under fluoro.  Send lipase   Deward Eastern, AGACNP-BC [facility]  See Amion for personal pager PCCM on call pager [phone] until 7pm. Please call Elink 7p-7a. [phone]  03/03/2024 3:13 PM

## 2024-03-03 NOTE — Progress Notes (Signed)
 Nutrition Follow-up  DOCUMENTATION CODES:   Non-severe (moderate) malnutrition in context of chronic illness  INTERVENTION:  Advance Cortrak from gastric to post pyloric to help with tolerance issues  Resume tube feeding via Cortrak after IR advances tube to post pyloric: begin tube feeds at 20ml/h and increase by 10 ml every 6 hours to monitor tolerance until goal rate of 77ml/h is reached Nepro at 45 ml/h (1080 ml per day)  Provides 1912 kcal, 87 gm protein, 785 ml free water  daily  D/c Nutrisource fiber due to issues with diarrhea requiring a FMS, NP agrees Continued RenaVit daily  NUTRITION DIAGNOSIS:   Moderate Malnutrition related to chronic illness (ESRD on HD/DM) as evidenced by mild fat depletion, mild muscle depletion.  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  6/11 - ED for abx for colitis 6/12 - admit for ICH; intubated with EVD; started CRRT 6/13 - s/p cortrak placement; tip gastric 6/14 - extubated  6/15 - CRRT stopped 6/16 - began iHD treatment regimen MWF 6/27- SLP therapy, allowing floor snacks of thin liquids/purees but remained NPO 6/28- recorded emesis episode  Pt continues iHD treatments MWF. Pt complains of nausea with 2 recorded emesis episodes in last 2 days, tube feeds are paused. NP recommended advancing Cortrak tube to post pyloric to help with tolerance of tube feeds, Cortrak team unsuccessful at advancement and recommended IR consult. Plan to resume tube feeds once Cortrak advanced post pyloric. RN reports pt has not been by mouth  from floor stock which was recommended by SLP. Recommended resuming Nepro via Cortrak once advanced at 25ml/hr then advancing 10ml every 6 hours until goal rate reached to monitor for tolerance. Pt's phosphorus continues to be high even with iHD treatments, will continue Nepro formula for now. NP agreed to d/c Nutrisource fiber due to pt's issues with diarrhea previously when tube feeds were still at goal rate. Nepro formula provides 27g fiber daily @ goal rate, additional fiber not necessary.  Admit weight: 59.3kg  Current weight: 56.5kg   Intake/Output Summary (Last 24 hours) at 03/03/2024 1503 Last data filed at 03/03/2024 0800 Gross per 24 hour  Intake 152.22 ml  Output 18 ml  Net 134.22 ml   Net IO Since Admission: 3,639.95 mL [03/03/24 1503]  Drains/Lines: FMS: 0mL (6/28 ) Cortrak gastric: 2 episode of emesis in last 48 hours HD AV Fistula R  Nutritionally Relevant Medications: Scheduled Meds:  darbepoetin (ARANESP ) injection - DIALYSIS  60 mcg Subcutaneous Q Fri-1800   insulin  aspart  0-9 Units Subcutaneous Q4H   metoCLOPramide  (REGLAN ) injection  5 mg Intravenous Q8H   midodrine   10 mg Per Tube Q8H   multivitamin  1 tablet Per Tube QHS   mycophenolate   500 mg Per Tube BID   niMODipine   60 mg Per Tube Q4H   sevelamer  carbonate  2.4 g Per Tube TID WC   sodium bicarbonate   1,300 mg Per Tube TID   Continuous Infusions:   ceFAZolin  (ANCEF ) IV Stopped (03/02/24 2227)   feeding supplement (NEPRO CARB STEADY) Stopped (03/01/24 1930)   Labs  Reviewed: Sodium 134/ Chloride 86 BUN 57/ Creatinine 6.10 Phosphorus 9.2 Hgb 8.3 CBG ranges from 89-125 mg/dL over the last 24 hours  Diet Order:   Diet Order             Diet NPO time specified  Diet effective now                  EDUCATION NEEDS:   No education needs have been identified at this time  Skin:  Skin Assessment: Reviewed RN Assessment  Last BM:  6/28 type 7 FMS ( )  Height:  Ht Readings from Last  1 Encounters:  02/14/24 5' 7.5 (1.715 m)   Weight:  Wt Readings from Last 1 Encounters:  03/02/24 56.5 kg   BMI:  Body mass index is 19.22 kg/m.  Estimated Nutritional Needs:   Kcal:  1800-2000  Protein:  75-90 grams  Fluid:  1L + UOP  Josette Glance, MS, RDN, LDN Clinical Dietitian I Please reach out via secure chat

## 2024-03-03 NOTE — Progress Notes (Signed)
 [facility] Progress Note   Subjective:   Patient awake but somewhat confused.  Wants her mittens off but no other complaints   Objective Vitals:   03/03/24 0800 03/03/24 0826 03/03/24 0900 03/03/24 1000  BP: (!) 167/73  (!) 130/94 135/71  Pulse: 86 89 83 84  Resp: 15 17 13 16   Temp: (!) 96.9 F (36.1 C)     TempSrc: Axillary     SpO2: 99% 99% 99% 98%  Weight:      Height:       Physical Exam General: Lying in bed, minimal distress Mittens in place Heart: Normal rate, murmur present, no rub Lungs: Lateral chest rise, no increased work of breathing Abdomen: soft, nontender Extremities: Trace BLE edema, warm and well-perfused  Dialysis Access:  RUE AVF +t/b  OP HD:  MWF AF 3h  B400   57.2kg  2K   AVF  Heparin  none - Hectoral 4mcg IV q HD - Mircera 150mcg IV q 2 weeks (last given 6/9 -> due 6/23)   Assessment/Plan: Intracerebral hemorrhage: C/b hydrocephalus, 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 TCDs with evidence of vasospasm 02/11/24. Followed closely by neurosurgery, plan is to d/c ventriculostomy on today Ongoing fevers: CSF culture 6/21 was negative, antibiotics per primary team BP/volume: bp's stable, continue with ultrafiltration as tolerated ESRD: MWF HD. S/p CRRT 6/12-6/12, now back to iHD. NO heparin .  Continue dialysis per schedule Anemia of ESRD: Hgb 8-10 here. Started darbe at 60 micrograms sq weekly. Check iron  stores Secondary HPTH:Ca and Phos high, VDRA stopped 6/21, added sevelamer  via tube. Check PTH and consider sensipar  Pancreas/ kidney transplant (2014): for the pancreas, we resumed her usual dosing of the CyA at 225mg  qam and 200 mg qpm. She had been underdosed here for about 2 wks.    03/03/2024, 11:44 AM  Recent Labs  Lab 02/28/24 0548 02/29/24 0557 03/01/24 0627 03/02/24 0721 03/03/24 0706  HGB  --   --    < > 7.9* 8.3*  ALBUMIN 2.8* 2.7*  --   --   --   CALCIUM  10.8* 11.5*   < > 10.5* 11.3*  PHOS 7.2* 9.2*  --    --   --   CREATININE 3.63* 5.58*   < > 4.66* 6.10*  K 4.2 4.5   < > 4.4 4.7   < > = values in this interval not displayed.    Inpatient medications:  acetaminophen   650 mg Per Tube Q4H   Or   acetaminophen   650 mg Rectal Q4H   aspirin   81 mg Per Tube Daily   Chlorhexidine  Gluconate Cloth  6 each Topical Q0600   Chlorhexidine  Gluconate Cloth  6 each Topical Q0600   cycloSPORINE   200 mg Per Tube QHS   cycloSPORINE   225 mg Per Tube Daily   darbepoetin (ARANESP ) injection - DIALYSIS  60 mcg Subcutaneous Q Fri-1800   fiber  1 packet Per Tube QID   heparin  injection (subcutaneous)  5,000 Units Subcutaneous Q8H   insulin  aspart  0-9 Units Subcutaneous Q4H   metoCLOPramide  (REGLAN ) injection  5 mg Intravenous Q8H   midodrine   10 mg Per Tube Q8H   multivitamin  1 tablet Per Tube QHS   mycophenolate   500 mg Per Tube BID   niMODipine   60 mg Per Tube Q4H   mouth rinse  15 mL Mouth Rinse 4 times per day   oxyCODONE   5 mg Per Tube Q8H   predniSONE   15 mg Per Tube Q  breakfast   sevelamer  carbonate  2.4 g Per Tube TID WC   sodium bicarbonate   1,300 mg Per Tube TID   sodium chloride  flush  3 mL Intravenous Q12H   sodium chloride  flush  3-10 mL Intravenous Q12H     ceFAZolin  (ANCEF ) IV Stopped (03/02/24 2227)   feeding supplement (NEPRO CARB STEADY) Stopped (03/01/24 1930)   albuterol , artificial tears, butalbital -acetaminophen -caffeine , Gerhardt's butt cream, hydrALAZINE , iohexol , labetalol , loperamide  HCl, simethicone , trimethobenzamide         \

## 2024-03-04 ENCOUNTER — Inpatient Hospital Stay (HOSPITAL_COMMUNITY)

## 2024-03-04 DIAGNOSIS — I639 Cerebral infarction, unspecified: Secondary | ICD-10-CM

## 2024-03-04 DIAGNOSIS — I609 Nontraumatic subarachnoid hemorrhage, unspecified: Secondary | ICD-10-CM | POA: Diagnosis not present

## 2024-03-04 DIAGNOSIS — I160 Hypertensive urgency: Secondary | ICD-10-CM | POA: Diagnosis not present

## 2024-03-04 DIAGNOSIS — R569 Unspecified convulsions: Secondary | ICD-10-CM | POA: Diagnosis not present

## 2024-03-04 DIAGNOSIS — G910 Communicating hydrocephalus: Secondary | ICD-10-CM | POA: Diagnosis not present

## 2024-03-04 DIAGNOSIS — I615 Nontraumatic intracerebral hemorrhage, intraventricular: Secondary | ICD-10-CM | POA: Diagnosis not present

## 2024-03-04 DIAGNOSIS — R110 Nausea: Secondary | ICD-10-CM | POA: Diagnosis not present

## 2024-03-04 LAB — PARATHYROID HORMONE, INTACT (NO CA): PTH: 115 pg/mL — ABNORMAL HIGH (ref 15–65)

## 2024-03-04 LAB — CBC
HCT: 25.2 % — ABNORMAL LOW (ref 36.0–46.0)
Hemoglobin: 8.5 g/dL — ABNORMAL LOW (ref 12.0–15.0)
MCH: 31.5 pg (ref 26.0–34.0)
MCHC: 33.7 g/dL (ref 30.0–36.0)
MCV: 93.3 fL (ref 80.0–100.0)
Platelets: 207 10*3/uL (ref 150–400)
RBC: 2.7 MIL/uL — ABNORMAL LOW (ref 3.87–5.11)
RDW: 14.3 % (ref 11.5–15.5)
WBC: 4.3 10*3/uL (ref 4.0–10.5)
nRBC: 0 % (ref 0.0–0.2)

## 2024-03-04 LAB — GLUCOSE, CAPILLARY
Glucose-Capillary: 92 mg/dL (ref 70–99)
Glucose-Capillary: 101 mg/dL — ABNORMAL HIGH (ref 70–99)
Glucose-Capillary: 96 mg/dL (ref 70–99)
Glucose-Capillary: 109 mg/dL — ABNORMAL HIGH (ref 70–99)
Glucose-Capillary: 95 mg/dL (ref 70–99)

## 2024-03-04 LAB — TROPONIN I (HIGH SENSITIVITY)
Troponin I (High Sensitivity): 32 ng/L — ABNORMAL HIGH (ref <18)
Troponin I (High Sensitivity): 31 ng/L — ABNORMAL HIGH (ref <18)

## 2024-03-04 LAB — PHOSPHORUS: Phosphorus: 4.7 mg/dL — ABNORMAL HIGH (ref 2.5–4.6)

## 2024-03-04 LAB — MAGNESIUM: Magnesium: 2.5 mg/dL — ABNORMAL HIGH (ref 1.7–2.4)

## 2024-03-04 MED ORDER — NALOXONE HCL 0.4 MG/ML IJ SOLN
0.4000 mg | INTRAMUSCULAR | Status: DC | PRN
  Administered 2024-03-04: 0.4 mg via INTRAVENOUS
  Filled 2024-03-04: qty 1

## 2024-03-04 MED ORDER — LIDOCAINE HCL (PF) 1 % IJ SOLN: 5.0000 mL | INTRAMUSCULAR | Status: DC | PRN

## 2024-03-04 MED ORDER — HEPARIN SODIUM (PORCINE) 1000 UNIT/ML DIALYSIS
1000.0000 [IU] | INTRAMUSCULAR | Status: DC | PRN
Start: 2024-03-04 — End: 2024-03-10

## 2024-03-04 MED ORDER — NEPRO/CARBSTEADY PO LIQD: 237.0000 mL | ORAL | Status: DC | PRN

## 2024-03-04 MED ORDER — LIDOCAINE-PRILOCAINE 2.5-2.5 % EX CREA: 1.0000 | TOPICAL_CREAM | CUTANEOUS | Status: DC | PRN

## 2024-03-04 MED ORDER — PENTAFLUOROPROP-TETRAFLUOROETH EX AERO: 1.0000 | INHALATION_SPRAY | CUTANEOUS | Status: DC | PRN

## 2024-03-04 MED ORDER — ALTEPLASE 2 MG IJ SOLR: 2.0000 mg | Freq: Once | INTRAMUSCULAR | Status: DC | PRN

## 2024-03-04 MED ORDER — MIDODRINE HCL 5 MG PO TABS
5.0000 mg | ORAL_TABLET | Freq: Three times a day (TID) | ORAL | Status: DC
  Administered 2024-03-04 – 2024-03-08 (×7): 5 mg
  Filled 2024-03-04 (×7): qty 1

## 2024-03-04 MED ORDER — ANTICOAGULANT SODIUM CITRATE 4% (200MG/5ML) IV SOLN
5.0000 mL | Status: DC | PRN
Start: 2024-03-04 — End: 2024-03-10

## 2024-03-04 MED ORDER — ORAL CARE MOUTH RINSE: 15.0000 mL | OROMUCOSAL | Status: DC | PRN

## 2024-03-04 MED ORDER — NALOXONE HCL 0.4 MG/ML IJ SOLN
INTRAMUSCULAR | Status: AC
  Filled 2024-03-04: qty 1

## 2024-03-04 MED ORDER — ONDANSETRON HCL 4 MG/2ML IJ SOLN
4.0000 mg | Freq: Four times a day (QID) | INTRAMUSCULAR | Status: DC | PRN
  Administered 2024-03-09 – 2024-03-31 (×33): 4 mg via INTRAVENOUS
  Filled 2024-03-04 (×31): qty 2

## 2024-03-04 NOTE — Progress Notes (Signed)
 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   Reduced level of consciousness noted earlier in the day and CT/MRI were performed without explanation.  Able to arouse to sternal rub earlier in the day and able answer questions for neurosurgery.  Attempted repeat dose of Narcan  with no response, no longer responding to sternal rub-opens eyes but unclear that she is protecting her airway, very sonorous  eICU Interventions  Given the decline in mentation, will request ground team evaluation  Neurosurgery was notified earlier by the primary team   0101 - brady again to 40s, making same swallowing gestures like she's trying to vomit while bathing, breath holding; self limited and everything resolves leaving a somnolent state.  Raise possibility of seizure-like activity.  Will reach out to neurology about potential EEG.  Intervention Category Intermediate Interventions: Change in mental status - evaluation and management  Weston Kallman 03/04/2024, 8:38 PM

## 2024-03-04 NOTE — Progress Notes (Signed)
   03/04/24 0150  Vitals  BP (!) 189/71  MAP (mmHg) 106  Pulse Rate 83  ECG Heart Rate 83  Resp 14  Oxygen  Therapy  SpO2 100 %  During Treatment Monitoring  Blood Flow Rate (mL/min) 0 mL/min  Arterial Pressure (mmHg) 1.21 mmHg  Venous Pressure (mmHg) 87.27 mmHg  TMP (mmHg) 18.79 mmHg  Ultrafiltration Rate (mL/min) 916 mL/min  Dialysate Flow Rate (mL/min) 299 ml/min  Dialysate Potassium Concentration 2  Dialysate Calcium  Concentration 2.5  Duration of HD Treatment -hour(s) 3 hour(s)  Cumulative Fluid Removed (mL) per Treatment  1500.18  HD Safety Checks Performed Yes  Intra-Hemodialysis Comments Tx completed  Post Treatment  Dialyzer Clearance Lightly streaked  Liters Processed 63  Fluid Removed (mL) 1500 mL  Tolerated HD Treatment Yes  AVG/AVF Arterial Site Held (minutes) 7 minutes  AVG/AVF Venous Site Held (minutes) 8 minutes

## 2024-03-04 NOTE — Progress Notes (Signed)
 Subjective: The patient is in no apparent distress.  She is without change.  Objective: Vital signs in last 24 hours: Temp:  [96.9 F (36.1 C)-98.4 F (36.9 C)] 98.4 F (36.9 C) (07/01 0400) Pulse Rate:  [78-189] 84 (07/01 0700) Resp:  [5-26] 13 (07/01 0700) BP: (124-197)/(54-95) 124/54 (07/01 0700) SpO2:  [86 %-100 %] 100 % (07/01 0700) Estimated body mass index is 19.22 kg/m as calculated from the following:   Height as of this encounter: 5' 7.5 (1.715 m).   Weight as of this encounter: 56.5 kg.   Intake/Output from previous day: 06/30 0701 - 07/01 0700 In: 3 [I.V.:3] Out: 1541 [Drains:6; Stool:35] Intake/Output this shift: No intake/output data recorded.  Physical exam the patient is sleeping.  She is easily arousable and will answer simple questions.  She is mildly confused without change.  Her ventriculostomy exit site is without drainage.  Lab Results: Recent Labs    03/03/24 0706 03/04/24 0606  WBC 3.9* 4.3  HGB 8.3* 8.5*  HCT 25.6* 25.2*  PLT 185 207   BMET Recent Labs    03/02/24 0721 03/03/24 0706  NA 137 134*  K 4.4 4.7  CL 91* 86*  CO2 22 17*  GLUCOSE 90 96  BUN 45* 57*  CREATININE 4.66* 6.10*  CALCIUM  10.5* 11.3*    Studies/Results: VAS US  TRANSCRANIAL DOPPLER Result Date: 03/03/2024  Transcranial Doppler Patient Name:  [name] [name]  Date of Exam:   03/03/2024 Medical Rec #: [id number]           Accession #:    [id number] Date of Birth: [birth year]  (43 years old)            Patient Gender: F Patient Age:   43 years Exam Location:  [facility] Procedure:      VAS US  TRANSCRANIAL DOPPLER Referring Phys: ARY XU --------------------------------------------------------------------------------  Indications: Subarachnoid hemorrhage. Limitations: patient positioning Comparison Study: 02/18/2024, 02/20/2024, 02/22/2024, 02/26/2024, 02/29/2024 Performing Technologist: Cordella Collet RVT  Examination Guidelines: A complete evaluation includes B-mode imaging,  spectral Doppler, color Doppler, and power Doppler as needed of all accessible portions of each vessel. Bilateral testing is considered an integral part of a complete examination. Limited examinations for reoccurring indications may be performed as noted.  +----------+---------------+----------+-----------+------------------+ RIGHT TCD Right VM (cm/s)Depth (cm)Pulsatility     Comment       +----------+---------------+----------+-----------+------------------+ MCA             45                    1.12                       +----------+---------------+----------+-----------+------------------+ ACA             -37                   1.59                       +----------+---------------+----------+-----------+------------------+ Term ICA        36                    1.54                       +----------+---------------+----------+-----------+------------------+ PCA P1          24                    1.47                       +----------+---------------+----------+-----------+------------------+  Opthalmic       31                    2.01                       +----------+---------------+----------+-----------+------------------+ ICA siphon                                    Unable to insonate +----------+---------------+----------+-----------+------------------+ Vertebral       -15                    1.5                       +----------+---------------+----------+-----------+------------------+ Distal ICA      -10                   1.56                       +----------+---------------+----------+-----------+------------------+  +----------+--------------+----------+-----------+------------------+ LEFT TCD  Left VM (cm/s)Depth (cm)Pulsatility     Comment       +----------+--------------+----------+-----------+------------------+ MCA             25                   1.09                        +----------+--------------+----------+-----------+------------------+ ACA            -27                   2.13                       +----------+--------------+----------+-----------+------------------+ Term ICA                                     Unable to insonate +----------+--------------+----------+-----------+------------------+ PCA P1                                       Unable to insonate +----------+--------------+----------+-----------+------------------+ Opthalmic       15                   1.76                       +----------+--------------+----------+-----------+------------------+ ICA siphon      14                   1.57                       +----------+--------------+----------+-----------+------------------+ Vertebral      -18                   1.35                       +----------+--------------+----------+-----------+------------------+ Distal ICA     -23                   1.39                       +----------+--------------+----------+-----------+------------------+  +------------+-------+------------------+  VM cm/s     Comment       +------------+-------+------------------+ Prox Basilar       Unable to insonate +------------+-------+------------------+ Dist Basilar       Unable to insonate +------------+-------+------------------+ +----------------------+---+ Right Lindegaard Ratio4.5 +----------------------+---+ +---------------------+----+ Left Lindegaard Ratio1.08 +---------------------+----+  Summary: This was a normal transcranial Doppler study, with normal flow direction and velocity of all identified vessels of the anterior and posterior circulations, with no evidence of stenosis, vasospasm or occlusion. There was no evidence of intracranial disease. Elevated pulsatility indices suggest increased intracranial pressure or diffuse intracranial atherosclerosis *See table(s) above for TCD measurements and  observations.  Diagnosing physician: Eather Popp MD Electronically signed by Eather Popp MD on 03/03/2024 at 4:19:26 PM.    Final    DG Abd 1 View Result Date: 03/03/2024 CLINICAL DATA:  Feeding tube placement. EXAM: ABDOMEN - 1 VIEW COMPARISON:  02/14/2024. FINDINGS: Lymph nasogastric tube has been removed. Feeding to appears to be coiled in the gastric antrum. IMPRESSION: Feeding tube appears coiled in the gastric antrum. Electronically Signed   By: Newell Eke M.D.   On: 03/03/2024 09:19    Assessment/Plan: Status post subarachnoid hemorrhage, hydrocephalus, ventriculostomy: Day #19.  The patient is tolerating the removal of her ventriculostomy.  LOS: 19 days     [name] 03/04/2024, 7:43 AM     Patient ID: [name], female   DOB: 1981/05/08, 43 y.o.   MRN: [id number]

## 2024-03-04 NOTE — Progress Notes (Signed)
 [facility] Progress Note   Subjective:   Patient tired not interactive   Objective Vitals:   03/04/24 0800 03/04/24 0900 03/04/24 0910 03/04/24 1200  BP: (!) 119/59 (!) 158/69  (!) 163/77  Pulse: 84 92  74  Resp: 11 14 20  (!) 6  Temp: 98.6 F (37 C)     TempSrc: Axillary     SpO2: 100% 99%  95%  Weight:      Height:       Physical Exam General: Lying in bed, minimal distress Mittens in place Heart: Normal rate, murmur present, no rub Lungs: bilateral chest rise, no increased work of breathing Abdomen: soft, nontender Extremities: Trace BLE edema, warm and well-perfused  Dialysis Access:  RUE AVF +t/b  OP HD:  MWF AF 3h  B400   57.2kg  2K   AVF  Heparin  none - Hectoral 4mcg IV q HD - Mircera 150mcg IV q 2 weeks (last given 6/9 -> due 6/23)   Assessment/Plan: Intracerebral hemorrhage: C/b hydrocephalus, 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 TCDs with evidence of vasospasm 02/11/24. Followed closely by neurosurgery, evd removed 6/30 Ongoing fevers: CSF culture 6/21 was negative, antibiotics per primary team BP/volume: bp's stable, continue with ultrafiltration as tolerated ESRD: MWF HD. S/p CRRT 6/12-6/12, now back to iHD. NO heparin .  Continue dialysis per schedule Anemia of ESRD: Hgb 8-10 here. Started darbe at 60 micrograms sq weekly. Iron  adequate Secondary HPTH:Ca and Phos high, VDRA stopped 6/21, added sevelamer  via tube. Check PTH and consider sensipar  Pancreas/ kidney transplant (2014): for the pancreas, we resumed her usual dosing of the CyA at 225mg  qam and 200 mg qpm. She had been underdosed here for about 2 wks.    03/04/2024, 1:26 PM  Recent Labs  Lab 02/28/24 0548 02/29/24 0557 03/01/24 0627 03/02/24 0721 03/03/24 0706 03/04/24 0606  HGB  --   --    < > 7.9* 8.3* 8.5*  ALBUMIN 2.8* 2.7*  --   --   --   --   CALCIUM  10.8* 11.5*   < > 10.5* 11.3*  --   PHOS 7.2* 9.2*  --   --   --  4.7*  CREATININE 3.63* 5.58*   < > 4.66*  6.10*  --   K 4.2 4.5   < > 4.4 4.7  --    < > = values in this interval not displayed.    Inpatient medications:  acetaminophen   650 mg Per Tube Q4H   Or   acetaminophen   650 mg Rectal Q4H   aspirin   81 mg Per Tube Daily   Chlorhexidine  Gluconate Cloth  6 each Topical Q0600   Chlorhexidine  Gluconate Cloth  6 each Topical Q0600   cycloSPORINE   200 mg Per Tube QHS   cycloSPORINE   225 mg Per Tube Daily   darbepoetin (ARANESP ) injection - DIALYSIS  60 mcg Subcutaneous Q Fri-1800   heparin  injection (subcutaneous)  5,000 Units Subcutaneous Q8H   insulin  aspart  0-9 Units Subcutaneous Q4H   metoCLOPramide  (REGLAN ) injection  5 mg Intravenous Q8H   midodrine   10 mg Per Tube Q8H   multivitamin  1 tablet Per Tube QHS   mycophenolate   500 mg Per Tube BID   niMODipine   60 mg Per Tube Q4H   mouth rinse  15 mL Mouth Rinse 4 times per day   oxyCODONE   5 mg Per Tube Q8H   predniSONE   15 mg Per Tube Q breakfast   sevelamer  carbonate  2.4  g Per Tube TID WC   sodium bicarbonate   1,300 mg Per Tube TID   sodium chloride  flush  3 mL Intravenous Q12H   sodium chloride  flush  3-10 mL Intravenous Q12H    anticoagulant sodium citrate      feeding supplement (NEPRO CARB STEADY) Stopped (03/01/24 1930)   albuterol , alteplase , anticoagulant sodium citrate , artificial tears, butalbital -acetaminophen -caffeine , feeding supplement (NEPRO CARB STEADY), Gerhardt's butt cream, heparin , hydrALAZINE , iohexol , labetalol , lidocaine  (PF), lidocaine -prilocaine , loperamide  HCl, ondansetron  (ZOFRAN ) IV, mouth rinse, pentafluoroprop-tetrafluoroeth, simethicone         \

## 2024-03-04 NOTE — Plan of Care (Signed)
 Patient assessment remained consistent overnight. HD at bedside with 1.5 liters removed

## 2024-03-04 NOTE — Progress Notes (Signed)
 SLP Cancellation Note  Patient Details Name: [name] MRN: [id number] DOB: 09-Mar-1981   Cancelled treatment:       Reason Eval/Treat Not Completed: Patient at procedure or test/unavailable  Norleen IVAR Blase, MA, CCC-SLP Speech Therapy

## 2024-03-04 NOTE — Progress Notes (Signed)
 PCCM INTERVAL PROGRESS NOTE   PM rounds  Patient is progressively lethargic throughout the day. CT scan showing stable hydrocephalus, but a hypodense area concerning for localized cerebral edema. Call is out to neurosurgery. Even more lethargic this afternoon. Narcan  given and she has responded well. DC scheduled oxycodone . Discussed scan results with neurosurgery. Will check MRI to further investigate the hypodense parietal area on MRI.    [name] [name], AGACNP-BC [facility]  See Amion for personal pager PCCM on call pager [phone] until 7pm. Please call Elink 7p-7a. [phone]  03/04/2024 2:28 PM

## 2024-03-04 NOTE — Progress Notes (Signed)
 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 Hypertensive in ED SBP 240s. Went for CT a/p and was c/f some colitis. Started on abx, given IVF. C/o pain and was very agitated, rcvd pain meds. Admitted 6/12. Over the course of the day she became less responsive. Remained hypertensive w SBPs >200. Incr O2 req to [facility]. Pt was to rcv HD 6/12 afternoon but when eval by HD NP, concerns for airway protection. Rcvd narcan  with only modest improvement. PCCM consulted in this setting.  Pertinent Medical History:  Failed renal / pancreas txp ESRD  HTN DM1 gastroparesis   Significant Hospital Events: Including procedures, antibiotic start and stop dates in addition to other pertinent events   6/11 ED w n/v AMS. Abx IVF for colitis 6/12 Admit. AMS decr LOC. Incr O2 req. PCCM consult and code stroke called seen w rapid response nephro NP then neuro  6/13 Intubated with EVD, responding to commands  6/14 Extubated 6/17 acute L cerebellar infarct. Keppra  dc  6/18 Dialysis, fevering, EVD at 10 (129 mL last 24 hrs) 6/19 Narcotic dependence a driving factor. +Vasospasm on TCD. CTA with evolving Left cerebellar infarct, no HT or mass effect, substantially decreased caliber of multiple intracranial arteries and bilateral [place] branches (especially: Distal left V4, supraclinoid ICAs, MCA and ACA origins), appearance c/w widespread Vasospasm, Vasoconstriction. No associated large vessel occlusion. 6/21 CSF sampline from EVD -- WBC 244 RBC 19000, cx ngtd  6/25: diagnostic angio today with nsgy - negative 6/26 evd leaking when clamped  6/30 evd removed  Interim History /  Subjective:   Awake most of the night calling out More lethargic today. Still complaining of generalized pain and nausea, but is less oriented today and seems to answer yes to most questions.  HD overnight > hypertension improved.    Objective:  Blood pressure (!) 124/54, pulse 84, temperature 98.4 F (36.9 C), temperature source Axillary, resp. rate 13, height 5' 7.5 (1.715 m), weight 56.5 kg, last menstrual period 02/13/2024, SpO2 100%.        Intake/Output Summary (Last 24 hours) at 03/04/2024 0837 Last data filed at 03/04/2024 0200 Gross per 24 hour  Intake 3 ml  Output 2035 ml  Net -2032 ml   Filed Weights   02/29/24 0500 03/01/24 0100 03/02/24 0500  Weight: 56.6 kg 56.6 kg 56.5 kg   Physical Examination: Middle aged female in NAD Pupils pinpoint Clear bilateral breath sounds RRR, no MRG Abd soft, non-tender.  Spontaneously awake, alert. Not answering orientation questions. Answers yes to most questions. Moving all extremities non-purposefully. Not following commands.    Resolved problem List:  Hypertensive emergency  Acute respiratory failure w hypoxia Aspiration PNA (completed rx 6/19)  Assessment and Plan:   ICH with brain compression Subarachnoid hemorrhage Communicating hydrocephalus s/p EVD removed 6/30 Left cerebellar infarct Ischemic stroke - Ancef  DC today - Continue transcranial Dopplers - Blood pressure goal 140-160 - On home midodrine  - BP very labile. High and low. Improved after HD - Exam worse today. With drain having been removed yesterday will repeat CT head today.   End-stage renal disease on hemodialysis - History of failed transplant - Nephrology  continues to follow following with dialysis needs  Diarrhea related to colitis Did complete antibiotics - On loperamide   Nausea with vomiting - Continue reglan  - IR will try to advance small bore feeding tube to post pyloric position.  - Need to try to restart tube feeds today regardless.    Chronic immunosuppression with CellCept , cyclosporine , prednisone  - continue  S/p pancreatic transplant Diabetes - Continue SSI  Chest pain: vague complaint - EKG, trops  Best Practice (right click and Reselect all SmartList Selections daily)   Diet/type: tubefeeds held DVT prophylaxis prophylactic heparin   and ASA started 6/20 GI prophylaxis: N/A Lines: N/A - EVD and yes it is still needed  Foley:  N/A Code Status:  full code Last date of multidisciplinary goals of care discussion [Friend updated 6/12. Patient[place].]   Critical care time 41 minutes  Deward Eastern, AGACNP-BC [facility]  See Amion for personal pager PCCM on call pager [phone] until 7pm. Please call Elink 7p-7a. [phone]  03/04/2024 8:37 AM

## 2024-03-05 ENCOUNTER — Inpatient Hospital Stay (HOSPITAL_COMMUNITY)

## 2024-03-05 DIAGNOSIS — R001 Bradycardia, unspecified: Secondary | ICD-10-CM

## 2024-03-05 DIAGNOSIS — I615 Nontraumatic intracerebral hemorrhage, intraventricular: Secondary | ICD-10-CM | POA: Diagnosis not present

## 2024-03-05 DIAGNOSIS — I609 Nontraumatic subarachnoid hemorrhage, unspecified: Secondary | ICD-10-CM | POA: Diagnosis not present

## 2024-03-05 DIAGNOSIS — I63542 Cerebral infarction due to unspecified occlusion or stenosis of left cerebellar artery: Secondary | ICD-10-CM | POA: Diagnosis not present

## 2024-03-05 DIAGNOSIS — R569 Unspecified convulsions: Secondary | ICD-10-CM | POA: Diagnosis not present

## 2024-03-05 DIAGNOSIS — N186 End stage renal disease: Secondary | ICD-10-CM | POA: Diagnosis not present

## 2024-03-05 DIAGNOSIS — G9340 Encephalopathy, unspecified: Secondary | ICD-10-CM | POA: Diagnosis not present

## 2024-03-05 DIAGNOSIS — Z992 Dependence on renal dialysis: Secondary | ICD-10-CM | POA: Diagnosis not present

## 2024-03-05 LAB — AMMONIA: Ammonia: 18 umol/L (ref 9–35)

## 2024-03-05 LAB — CBC
HCT: 27.1 % — ABNORMAL LOW (ref 36.0–46.0)
Hemoglobin: 8.8 g/dL — ABNORMAL LOW (ref 12.0–15.0)
MCH: 30.8 pg (ref 26.0–34.0)
MCHC: 32.5 g/dL (ref 30.0–36.0)
MCV: 94.8 fL (ref 80.0–100.0)
Platelets: 227 10*3/uL (ref 150–400)
RBC: 2.86 MIL/uL — ABNORMAL LOW (ref 3.87–5.11)
RDW: 14.6 % (ref 11.5–15.5)
WBC: 4.3 10*3/uL (ref 4.0–10.5)
nRBC: 0 % (ref 0.0–0.2)

## 2024-03-05 LAB — GLUCOSE, CAPILLARY
Glucose-Capillary: 110 mg/dL — ABNORMAL HIGH (ref 70–99)
Glucose-Capillary: 119 mg/dL — ABNORMAL HIGH (ref 70–99)
Glucose-Capillary: 127 mg/dL — ABNORMAL HIGH (ref 70–99)
Glucose-Capillary: 84 mg/dL (ref 70–99)
Glucose-Capillary: 88 mg/dL (ref 70–99)
Glucose-Capillary: 97 mg/dL (ref 70–99)

## 2024-03-05 LAB — BASIC METABOLIC PANEL WITH GFR
Anion gap: 25 — ABNORMAL HIGH (ref 5–15)
BUN: 37 mg/dL — ABNORMAL HIGH (ref 6–20)
CO2: 21 mmol/L — ABNORMAL LOW (ref 22–32)
Calcium: 10.8 mg/dL — ABNORMAL HIGH (ref 8.9–10.3)
Chloride: 88 mmol/L — ABNORMAL LOW (ref 98–111)
Creatinine, Ser: 4.82 mg/dL — ABNORMAL HIGH (ref 0.44–1.00)
GFR, Estimated: 11 mL/min — ABNORMAL LOW (ref >60)
Glucose, Bld: 89 mg/dL (ref 70–99)
Potassium: 4.3 mmol/L (ref 3.5–5.1)
Sodium: 134 mmol/L — ABNORMAL LOW (ref 135–145)

## 2024-03-05 LAB — PHOSPHORUS: Phosphorus: 8.6 mg/dL — ABNORMAL HIGH (ref 2.5–4.6)

## 2024-03-05 LAB — MAGNESIUM: Magnesium: 2.7 mg/dL — ABNORMAL HIGH (ref 1.7–2.4)

## 2024-03-05 MED ORDER — LACOSAMIDE 50 MG PO TABS
100.0000 mg | ORAL_TABLET | Freq: Two times a day (BID) | ORAL | Status: DC
  Administered 2024-03-05 – 2024-03-06 (×2): 100 mg via ORAL
  Filled 2024-03-05 (×2): qty 2

## 2024-03-05 MED ORDER — ATROPINE SULFATE 1 MG/10ML IJ SOSY
1.0000 mg | PREFILLED_SYRINGE | INTRAMUSCULAR | Status: DC | PRN
  Filled 2024-03-05: qty 10

## 2024-03-05 MED ORDER — NEPRO/CARBSTEADY PO LIQD: 1000.0000 mL | ORAL | Status: DC

## 2024-03-05 MED ORDER — NEPRO/CARBSTEADY PO LIQD
1000.0000 mL | ORAL | Status: DC
  Administered 2024-03-05: 1000 mL
  Filled 2024-03-05: qty 1000

## 2024-03-05 MED ORDER — CHLORHEXIDINE GLUCONATE CLOTH 2 % EX PADS
6.0000 | MEDICATED_PAD | Freq: Every day | CUTANEOUS | Status: DC
  Administered 2024-03-06 – 2024-03-08 (×3): 6 via TOPICAL

## 2024-03-05 NOTE — Progress Notes (Addendum)
 STROKE TEAM PROGRESS NOTE    SIGNIFICANT HOSPITAL EVENTS 6/11- ED for nausea, vomiting after dialysis 6/12- intubated, EVD placed  6/14 - extubated 6/17 - Acute left cerebellar infarct 6/19 - vasospasm seen on TCD 6/21 - CSF sampling from EVD with WBC 244 and RBC 19000 6/25- stroke signed off.  INTERIM HISTORY/SUBJECTIVE Reconsulted for concern for seizures. More lethargic, difficult to arouse. Having episodes of bradycardia. RN noted jaw clenched and somnolence. She was given narcan  initially with no response. She was later given a second dose of narcan  with no immediate response but some gradual improvement after 5 mins. However, still quite lethargic and difficult to arouse for me.  OBJECTIVE  CBC    Component Value Date/Time   WBC 4.3 03/04/2024 0606   RBC 2.70 (L) 03/04/2024 0606   HGB 8.5 (L) 03/04/2024 0606   HCT 25.2 (L) 03/04/2024 0606   PLT 207 03/04/2024 0606   MCV 93.3 03/04/2024 0606   MCH 31.5 03/04/2024 0606   MCHC 33.7 03/04/2024 0606   RDW 14.3 03/04/2024 0606   LYMPHSABS 1.2 02/20/2024 1125   MONOABS 0.7 02/20/2024 1125   EOSABS 0.0 02/20/2024 1125   BASOSABS 0.0 02/20/2024 1125    BMET    Component Value Date/Time   NA 134 (L) 03/03/2024 0706   K 4.7 03/03/2024 0706   CL 86 (L) 03/03/2024 0706   CO2 17 (L) 03/03/2024 0706   GLUCOSE 96 03/03/2024 0706   BUN 57 (H) 03/03/2024 0706   CREATININE 6.10 (H) 03/03/2024 0706   CALCIUM  11.3 (H) 03/03/2024 0706   GFRNONAA 8 (L) 03/03/2024 0706    IMAGING past 24 hours DG Abd 1 View Result Date: 03/04/2024 CLINICAL DATA:  Encounter for feeding tube placement. EXAM: ABDOMEN - 1 VIEW COMPARISON:  None Available. FINDINGS: Feeding tube placement was performed by radiology technologist. No radiologist was involved in this exam. Four fluoroscopic spot images submitted. A 10 French Cortrak tube was placed, tip appears to be post pyloric within the distal duodenum. 20 cc of Omnipaque  300 was injected through the  tube. Fluoroscopy time 2 minutes 54 seconds. Dose 13.5 mGy. IMPRESSION: Feeding tube placement with tip post pyloric in the proximal duodenum. Electronically Signed   By: Andrea Gasman M.D.   On: 03/04/2024 15:59   CT HEAD WO CONTRAST ( ) Result Date: 03/04/2024 CLINICAL DATA:  43 year old female dialysis patient with acute intracranial hemorrhage on 02/14/2024. Subarachnoid and subdural blood. External ventricular drain removed. EXAM: CT HEAD WITHOUT CONTRAST TECHNIQUE: Contiguous axial images were obtained from the base of the skull through the vertex without intravenous contrast. RADIATION DOSE REDUCTION: This exam was performed according to the departmental dose-optimization program which includes automated exposure control, adjustment of the mA and/or kV according to patient size and/or use of iterative reconstruction technique. COMPARISON:  Head CT 02/20/2024 and earlier. FINDINGS: Brain: Right temporal horn pneumo ventricle. That temporal horn mildly dilated now, but otherwise no significant change in lateral ventricle size. Fourth ventricle appears larger on sagittal image 30. Third ventricle also slightly larger on coronal image 34. No transependymal edema. Trace residual layering IVH in the occipital horns. Gas also along the EVD tract. No midline shift. Basilar cisterns are patent. Trace residual subarachnoid blood. No convincing subdural hematoma. Heterogeneous gray and white matter in the left parietal lobe in an area of 2 cm on sagittal image 39 is new, resembles mild cerebral edema but there is no regional mass effect. Gray-white differentiation elsewhere is maintained. Small area of chronic encephalomalacia  in the left cerebellar tonsil is stable. Vascular: Calcified atherosclerosis at the skull base. No suspicious intracranial vascular hyperdensity. Skull: Right superior frontal burr hole.  Otherwise intact. Sinuses/Orbits: Left nasoenteric tube in place. Visualized paranasal sinuses and  mastoids are stable and well aerated. Other: Postoperative changes to the right scalp vertex. No new orbit or scalp soft tissue finding. Calcified scalp vessel atherosclerosis. IMPRESSION: 1. Right frontal approach EVD removed with mild pneumocephalus and right temporal horn pneumo ventricle. The right temporal horn, lateral and 3rd ventricles are mildly larger since 02/20/2024. Trace residual SAH, IVH and no transependymal edema. 2. New 2 cm area of heterogeneous hypodensity in the left parietal lobe resembling cerebral edema. No regional or intracranial mass effect. 3. Stable gray-white differentiation elsewhere, including mild chronic encephalomalacia in the left inferior cerebellum. Electronically Signed   By: VEAR Hurst M.D.   On: 03/04/2024 11:35       Vitals:   03/04/24 2200 03/04/24 2300 03/05/24 0000 03/05/24 0100  BP: (!) 167/77 (!) 150/77 (!) 180/132 (!) 149/72  Pulse: 87 82 86 87  Resp: (!) 7 14 19 15   Temp:   97.8 F (36.6 C)   TempSrc:   Axillary   SpO2: 99% 90% 94% 100%  Weight:      Height:         PHYSICAL EXAM General: Alert, ill-appearing patient, NAD CV: ST, 110s Respiratory: room air, unlabored  NEURO:  Patient is difficult to arouse. Does open eyes briefly to vigorous tactile stimulation. Dysconjugate gaze. Makes brief eye contact. However, closes her eyes immediately upon cessation of stimulus. Some spontaneous movements in BL uppers, withdraws BL lower extremities.  Imaging/diagnostics: MRI Brain from 03/04/24: 1. Left parietal acute or early subacute infarct with associated edema. No substantial mass effect. 2. Right frontal approach EVD with similar pneumoventricle, mild ventriculomegaly, and small volume intraventricular hemorrhage. Scattered small volume of subarachnoid hemorrhage better characterized on recent CT head. 3. Small remote cerebellar and bilateral thalamic lacunar infarcts and chronic microvascular ischemic disease.  ASSESSMENT/PLAN  Ms.  Celsey Naseer Koury is a 43 y.o. female with history of hypertension, ESRD on HD, s/p renal and pancreatic transplant, diabetes mellitus type 1 with gastroparesis presents with nausea and vomiting since after dialysis. Admitted initially for sepsis felt to be secondary to colitis. Found to have perimesencephalic SAH with IVH suspected to be either hypertensive in etiology vs secondary to peripontine venous anomaly. This was complicated by hydropcehalus s/p EVD placement. Imaging also demonstrated a small left cerbellar infarct, presumed secondary to vasospasm.  Neurology reconsulted overnight for possible seizures. She has been more lethargic, difficult to arouse. Having episodes of bradycardia. RN noted jaw clenched and somnolence. She was given narcan  initially with no response. She was later given a second dose of narcan  with no immediate response but some gradual improvement after 5 mins. However, still quite lethargic and difficult to arouse for me.   MRI Brain yesterday afternoon notable for acute/early subacute L parietal patchy infarct, this appears to have progressed since her prior stroke noted on MRI brain on 02/17/24.  No obvious clinical seizure activity noted on my evaluation. Will get her up on cEEG too since she is at increased risk of seizures given her SAH/IVH, hydropcehalus and noted L parietal acute/subacute stroke. Routine EEG earlier this hospitalization with GPD on ictal-interictal spectrum but no definitive seizures.  Plan: - Ammonia. - cEEG - further recommendations pending above. - stroke team to re-evaluate for need for full stroke workup.  Aquita Simmering  [name]

## 2024-03-05 NOTE — Progress Notes (Signed)
 Transcranial Doppler   Date POD PCO2 HCT BP   MCA ACA PCA OPHT SIPH VERT Basilar  6/16 CK       130/59 Right  Left   *  13   *     -50 *     25 25     21  *     * -28     -18 -22       6/18 rs         Right  Left   130     110 -61     -53 54     26 19     19  *     * -42     -14 *        6/20 CK         Right  Left    131   24    *   *    -32   -27    20   27     *   *    *   *    *       6/24,rs            Right  Left    197   146    -42   -46    66   50    24   27    *   *    -24   -21    -38       6/27   JH     26.9  154/70  Right  Left    124   53    -27   -20    43   36    18   18    *   *    -15   -28    -27        6/30 GC         Right  Left    45   25    -37   -27    24   *    31   15    *   14    -15   -18    *       7/2 JH     27.1  144/95  Right  Left                                                     *Unable to insonate Lindegaard Ratio:  RIGHT - 1.97  -LEFT - 1.60 MCA = Middle Cerebral Artery      OPHT = Opthalmic Artery     BASILAR = Basilar Artery   ACA = Anterior Cerebral Artery     SIPH = Carotid Siphon PCA = Posterior Cerebral Artery   VERT = Verterbral Artery                    Normal MCA = 62+\-12 ACA = 50+\-12 PCA = 42+\-23    Results can be found under chart review under CV PROC. 03/05/2024 12:49 PM Deneene Tarver RVT, RDMS

## 2024-03-05 NOTE — Progress Notes (Signed)
 [facility] Progress Note   Subjective:   Bradycardic episode with some emesis.  Minimally interactive.  Found to have new parietal CVA on 7/1   Objective Vitals:   03/05/24 1152 03/05/24 1200 03/05/24 1215 03/05/24 1230  BP: (!) 149/94 (!) 147/82 (!) 144/95 (!) 165/84  Pulse: (!) 101 98 96 98  Resp: 17 15 14 13   Temp: 97.6 F (36.4 C)     TempSrc: Oral     SpO2: 100% 100% 100% 100%  Weight: 54.5 kg     Height:       Physical Exam General: Lying in bed, no distress Heart: Normal rate, murmur present, no rub Lungs: bilateral chest rise, no increased work of breathing Abdomen: soft, nontender Extremities: Trace BLE edema, warm and well-perfused  Dialysis Access:  RUE AVF +t/b  OP HD:  MWF AF 3h  B400   57.2kg  2K   AVF  Heparin  none - Hectoral 4mcg IV q HD - Mircera 150mcg IV q 2 weeks (last given 6/9 -> due 6/23)   Assessment/Plan: Intracerebral hemorrhage: C/b hydrocephalus, 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 TCDs with evidence of vasospasm 02/11/24. Followed closely by neurosurgery, evd removed 6/30 CVA: New CVA found on 7/1.  Management per primary team Ongoing fevers: CSF culture 6/21 was negative, antibiotics per primary team BP/volume: bp's stable, continue with ultrafiltration as tolerated ESRD: MWF HD. S/p CRRT 6/12-6/12, now back to iHD. NO heparin .  Continue dialysis per schedule Anemia of ESRD: Hgb 8-10 here. Started darbe at 60 micrograms sq weekly. Iron  adequate Secondary HPTH:Ca and Phos high, VDRA stopped 6/21, added sevelamer  via tube. PTH 115.  Could consider low-dose Sensipar .  Continue to monitor for now because I think hypercalcemia likely a minimal problem at this point Pancreas/ kidney transplant (2014): for the pancreas, we resumed her usual dosing of the CyA at 225mg  qam and 200 mg qpm. She had been underdosed here for about 2 wks.    03/05/2024, 12:50 PM  Recent Labs  Lab 02/28/24 0548 02/29/24 0557 03/01/24 0627  03/03/24 0706 03/04/24 0606 03/05/24 0459  HGB  --   --    < > 8.3* 8.5* 8.8*  ALBUMIN 2.8* 2.7*  --   --   --   --   CALCIUM  10.8* 11.5*   < > 11.3*  --  10.8*  PHOS 7.2* 9.2*  --   --  4.7* 8.6*  CREATININE 3.63* 5.58*   < > 6.10*  --  4.82*  K 4.2 4.5   < > 4.7  --  4.3   < > = values in this interval not displayed.    Inpatient medications:  acetaminophen   650 mg Per Tube Q4H   Or   acetaminophen   650 mg Rectal Q4H   aspirin   81 mg Per Tube Daily   Chlorhexidine  Gluconate Cloth  6 each Topical Q0600   Chlorhexidine  Gluconate Cloth  6 each Topical Q0600   cycloSPORINE   200 mg Per Tube QHS   cycloSPORINE   225 mg Per Tube Daily   darbepoetin (ARANESP ) injection - DIALYSIS  60 mcg Subcutaneous Q Fri-1800   heparin  injection (subcutaneous)  5,000 Units Subcutaneous Q8H   insulin  aspart  0-9 Units Subcutaneous Q4H   metoCLOPramide  (REGLAN ) injection  5 mg Intravenous Q8H   midodrine   5 mg Per Tube Q8H   multivitamin  1 tablet Per Tube QHS   mycophenolate   500 mg Per Tube BID   niMODipine   60 mg Per  Tube Q4H   mouth rinse  15 mL Mouth Rinse 4 times per day   predniSONE   15 mg Per Tube Q breakfast   sevelamer  carbonate  2.4 g Per Tube TID WC   sodium bicarbonate   1,300 mg Per Tube TID   sodium chloride  flush  3 mL Intravenous Q12H   sodium chloride  flush  3-10 mL Intravenous Q12H    anticoagulant sodium citrate      feeding supplement (NEPRO CARB STEADY) 1,000 mL (03/05/24 1222)   albuterol , alteplase , anticoagulant sodium citrate , artificial tears, atropine, butalbital -acetaminophen -caffeine , feeding supplement (NEPRO CARB STEADY), Gerhardt's butt cream, heparin , hydrALAZINE , iohexol , labetalol , lidocaine  (PF), lidocaine -prilocaine , loperamide  HCl, naLOXone  (NARCAN )  injection, ondansetron  (ZOFRAN ) IV, mouth rinse, pentafluoroprop-tetrafluoroeth, simethicone         \

## 2024-03-05 NOTE — Progress Notes (Addendum)
 LTM EEG hooked up and running - no initial skin breakdown - push button tested - [facility] monitoring. Prolonged setup due to post surgical residue on heavily matted of hair.  Impedances below 10kOhms on all leads

## 2024-03-05 NOTE — Plan of Care (Signed)
  Problem: Clinical Measurements: Goal: Ability to maintain clinical measurements within normal limits will improve Outcome: Progressing Goal: Will remain free from infection Outcome: Progressing Goal: Diagnostic test results will improve Outcome: Progressing Goal: Respiratory complications will improve Outcome: Progressing Goal: Cardiovascular complication will be avoided Outcome: Progressing   Problem: Activity: Goal: Risk for activity intolerance will decrease Outcome: Progressing   Problem: Nutrition: Goal: Adequate nutrition will be maintained Outcome: Progressing   Problem: Coping: Goal: Level of anxiety will decrease Outcome: Progressing   Problem: Elimination: Goal: Will not experience complications related to bowel motility Outcome: Progressing   Problem: Pain Managment: Goal: General experience of comfort will improve and/or be controlled Outcome: Progressing   Problem: Safety: Goal: Ability to remain free from injury will improve Outcome: Progressing   Problem: Skin Integrity: Goal: Risk for impaired skin integrity will decrease Outcome: Progressing   Problem: Coping: Goal: Ability to adjust to condition or change in health will improve Outcome: Progressing   Problem: Fluid Volume: Goal: Ability to maintain a balanced intake and output will improve Outcome: Progressing   Problem: Metabolic: Goal: Ability to maintain appropriate glucose levels will improve Outcome: Progressing   Problem: Nutritional: Goal: Maintenance of adequate nutrition will improve Outcome: Progressing Goal: Progress toward achieving an optimal weight will improve Outcome: Progressing   Problem: Skin Integrity: Goal: Risk for impaired skin integrity will decrease Outcome: Progressing   Problem: Tissue Perfusion: Goal: Adequacy of tissue perfusion will improve Outcome: Progressing   Problem: Education: Goal: Knowledge of secondary prevention will improve (MUST DOCUMENT  ALL) Outcome: Progressing Goal: Knowledge of patient specific risk factors will improve (DELETE if not current risk factor) Outcome: Progressing   Problem: Intracerebral Hemorrhage Tissue Perfusion: Goal: Complications of Intracerebral Hemorrhage will be minimized Outcome: Progressing   Problem: Health Behavior/Discharge Planning: Goal: Goals will be collaboratively established with patient/family Outcome: Progressing   Problem: Self-Care: Goal: Ability to participate in self-care as condition permits will improve Outcome: Progressing Goal: Verbalization of feelings and concerns over difficulty with self-care will improve Outcome: Progressing Goal: Ability to communicate needs accurately will improve Outcome: Progressing   Problem: Nutrition: Goal: Risk of aspiration will decrease Outcome: Progressing Goal: Dietary intake will improve Outcome: Progressing

## 2024-03-05 NOTE — Progress Notes (Signed)
 Nutrition Follow-up  DOCUMENTATION CODES:   Non-severe (moderate) malnutrition in context of chronic illness  Nutrition Follow-up  DOCUMENTATION CODES:   Non-severe (moderate) malnutrition in context of chronic illness  INTERVENTION:   Resume tube feeding via post pyloric Cortrak at trickles: Nepro Carb steady at 20 ml/hr (480 ml per day)  Provides 850 kcal, 38 gm protein, 349 ml free water  daily  If patient tolerates tube feeds at trickles, recommend increasing by 10 ml every 8 hours until goal of Nepro Carb Steady at 45 ml/hr and monitor tolerance (1,080 ml per day)  Provides Provides 1912 kcal, 87 gm protein, 785 ml free water  daily  Continued RenaVit daily  NUTRITION DIAGNOSIS:   Moderate Malnutrition related to chronic illness (ESRD on HD/DM) as evidenced by mild fat depletion, mild muscle depletion. 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  6/11 - ED for abx for colitis 6/12 - admit for ICH; intubated with EVD; started CRRT 6/13 - s/p cortrak placement; tip gastric 6/14 - extubated  6/15 - CRRT stopped 6/16 - began iHD treatment regimen MWF 6/27- SLP therapy, allowing floor snacks of thin liquids/purees but remained NPO 6/28- recorded emesis episode, tube feeds stopped 6/30 -EVD removed. HD overnight  7/1 - s/p post pyloric cortrak placement, New CVA  7/2 - Trickle tube feeds resumed, HD AM   Fluoroscopy was able to advance tube post pyloric yesterday. Will start with trickles today, if patient tolerates will  titrate to goal tomorrow.  If pt  with ongoing  nausea/vomiting with pos pyloric tube feeds, recommend transitioning to vital for better tolerance. Discussed with CCM NP.   Having IHD this AM, 2 L removed. Continuous EEG to rule out seizures.    Admit weight: 59.3 kg  Current weight: 54.5 kg   Intake/Output Summary (Last 24 hours) at 03/05/2024 1526 Last data filed at 03/05/2024 1400 Gross per 24 hour  Intake 32.67 ml  Output 2400 ml  Net -2367.33 ml    Nutritionally Relevant Medications: Scheduled Meds:  insulin  aspart  0-9 Units Subcutaneous Q4H   metoCLOPramide  (REGLAN ) injection  5 mg Intravenous Q8H   midodrine   5 mg Per Tube Q8H   multivitamin  1 tablet Per Tube QHS   mycophenolate   500 mg Per Tube BID   niMODipine   60 mg Per Tube Q4H   mouth rinse  15 mL Mouth Rinse 4 times per day   predniSONE   15 mg Per Tube Q breakfast   sevelamer  carbonate  2.4 g Per Tube TID WC   Continuous Infusions:  anticoagulant sodium citrate      feeding supplement (NEPRO CARB STEADY)     Labs Reviewed: Sodium 134 BUN 37 Creatinine 4.82 Phosphorus 8.6 Magnesium  2.7 Albumin 2.7  PTH 115 CBG ranges from 84-119 mg/dL over the last 24 hours HgbA1c 4.2   Diet Order:   Diet Order             Diet NPO time specified  Diet effective now                   EDUCATION  NEEDS:   No education needs have been identified at this time  Skin:  Skin Assessment: Reviewed RN Assessment  Last BM:  7/2 1400 ml via FMS x 24 hours  Height:   Ht Readings from Last 1 Encounters:  02/14/24 5' 7.5 (1.715 m)    Weight:   Wt Readings from Last 1 Encounters:  03/05/24 54.5 kg    BMI:  Body mass index is 18.54 kg/m.  Estimated Nutritional Needs:   Kcal:  1800-2000  Protein:  75-90 grams  Fluid:  1L + UOP   Olivia Kenning, RD Registered Dietitian  See Amion for more information

## 2024-03-05 NOTE — Plan of Care (Signed)
  Problem: Clinical Measurements: Goal: Ability to maintain clinical measurements within normal limits will improve Outcome: Progressing Goal: Will remain free from infection Outcome: Progressing Goal: Diagnostic test results will improve Outcome: Progressing Goal: Respiratory complications will improve Outcome: Progressing Goal: Cardiovascular complication will be avoided Outcome: Progressing   Problem: Activity: Goal: Risk for activity intolerance will decrease Outcome: Progressing   Problem: Nutrition: Goal: Adequate nutrition will be maintained Outcome: Progressing   Problem: Coping: Goal: Level of anxiety will decrease Outcome: Progressing   Problem: Pain Managment: Goal: General experience of comfort will improve and/or be controlled Outcome: Progressing   Problem: Safety: Goal: Ability to remain free from injury will improve Outcome: Progressing   Problem: Skin Integrity: Goal: Risk for impaired skin integrity will decrease Outcome: Progressing   Problem: Fluid Volume: Goal: Ability to maintain a balanced intake and output will improve Outcome: Progressing   Problem: Metabolic: Goal: Ability to maintain appropriate glucose levels will improve Outcome: Progressing   Problem: Skin Integrity: Goal: Risk for impaired skin integrity will decrease Outcome: Progressing   Problem: Tissue Perfusion: Goal: Adequacy of tissue perfusion will improve Outcome: Progressing   Problem: Self-Care: Goal: Ability to participate in self-care as condition permits will improve Outcome: Progressing Goal: Verbalization of feelings and concerns over difficulty with self-care will improve Outcome: Progressing Goal: Ability to communicate needs accurately will improve Outcome: Progressing   Problem: Cardiovascular: Goal: Ability to achieve and maintain adequate cardiovascular perfusion will improve Outcome: Progressing Goal: Vascular access site(s) Level 0-1 will be  maintained Outcome: Progressing

## 2024-03-05 NOTE — Progress Notes (Signed)
 Subjective: The patient is without much change from yesterday.  She is alert, not conversant.  She will mumble a bit.    Objective: Vital signs in last 24 hours: Temp:  [96.5 F (35.8 C)-98.3 F (36.8 C)] 97.8 F (36.6 C) (07/02 0800) Pulse Rate:  [74-109] 95 (07/02 0845) Resp:  [3-26] 17 (07/02 0845) BP: (123-180)/(60-132) 138/89 (07/02 0845) SpO2:  [90 %-100 %] 100 % (07/02 0845) Weight:  [56.5 kg] 56.5 kg (07/02 0800) Estimated body mass index is 19.22 kg/m as calculated from the following:   Height as of this encounter: 5' 7.5 (1.715 m).   Weight as of this encounter: 56.5 kg.   Intake/Output from previous day: 07/01 0701 - 07/02 0700 In: -  Out: 1400 [Stool:1400] Intake/Output this shift: No intake/output data recorded.  Physical exam the patient is alert.  Her pupils are equal.  She does not quite follow commands.  She mumbles a bit.  I reviewed the patient's head CT and brain MRI performed yesterday.  She has mild ventriculomegaly and pneumocephaly.  She has had a new left parietal small stroke.  Lab Results: Recent Labs    03/04/24 0606 03/05/24 0459  WBC 4.3 4.3  HGB 8.5* 8.8*  HCT 25.2* 27.1*  PLT 207 227   BMET Recent Labs    03/03/24 0706 03/05/24 0459  NA 134* 134*  K 4.7 4.3  CL 86* 88*  CO2 17* 21*  GLUCOSE 96 89  BUN 57* 37*  CREATININE 6.10* 4.82*  CALCIUM  11.3* 10.8*    Studies/Results: Overnight EEG with video Result Date: 03/05/2024 Shelton Arlin KIDD, MD     03/05/2024  8:45 AM Patient Name: Inesha Sow MRN: [id number] Epilepsy Attending: Arlin KIDD Shelton Referring Physician/Provider: Khaliqdina, Salman, MD Duration: 03/05/2024 0517 to 0845 Patient history:  43 y.o. female with history of hypertension, ESRD on HD, s/p renal and pancreatic transplant, diabetes mellitus type 1 with gastroparesis presents with nausea and vomiting since after dialysis. Admitted initially for sepsis felt to be secondary to colitis. Found to have  perimesencephalic SAH with IVH suspected to be either hypertensive in etiology vs secondary to peripontine venous anomaly. This was complicated by hydropcehalus s/p EVD placement. Imaging also demonstrated a small left cerbellar infarct, presumed secondary to vasospasm. Neurology reconsulted overnight for possible seizures. EEG to evaluate for seizure. Level of alertness:  comatose/ lethargic AEDs during EEG study: None Technical aspects: This EEG study was done with scalp electrodes positioned according to the 10-20 International system of electrode placement. Electrical activity was reviewed with band pass filter of 1-70Hz , sensitivity of 7 uV/mm, display speed of 41mm/sec with a 60Hz  notched filter applied as appropriate. EEG data were recorded continuously and digitally stored.  Video monitoring was available and reviewed as appropriate. Description: EEG showed continuous generalized polymorphic sharply contoured 3 to 6 Hz theta-delta slowing. Hyperventilation and photic stimulation were not performed.   ABNORMALITY - Continuous slow, generalized IMPRESSION: This study is suggestive of moderate to severe diffuse encephalopathy. No seizures or epileptiform discharges were seen throughout the recording. Arlin KIDD Shelton   MR BRAIN WO CONTRAST Result Date: 03/05/2024 CLINICAL DATA:  Mental status change, unknown cause EXAM: MRI HEAD WITHOUT CONTRAST TECHNIQUE: Multiplanar, multiecho pulse sequences of the brain and surrounding structures were obtained without intravenous contrast. COMPARISON:  MRI head February 17, 2024. CT head from earlier today. FINDINGS: Brain: The area of hypodensity seen on recent CT head correlates with peripheral cortical restricted diffusion and edema, compatible with acute or  early subacute infarct. Susceptibility artifact and T2 hypointensity in the right temporal horn is consistent with pneumo ventricle and similar cross modalities to recent CT head. Right frontal approach EVD in place  with similar size of the ventricles which remain mildly enlarged. Layering signal abnormality within the lateral ventricles is consistent with intraventricular hemorrhage which is also similar across modalities. Similar small focus of encephalomalacia in the cerebellum. Small remote lacunar infarcts in the thalami. Scattered T2/FLAIR hyperintensities in the white matter, compatible with chronic microvascular ischemic disease. Vascular: Normal flow voids. Skull and upper cervical spine: Normal marrow signal. Sinuses/Orbits: Mild paranasal sinus mucosal thickening. No acute orbital findings. IMPRESSION: 1. Left parietal acute or early subacute infarct with associated edema. No substantial mass effect. 2. Right frontal approach EVD with similar pneumoventricle, mild ventriculomegaly, and small volume intraventricular hemorrhage. Scattered small volume of subarachnoid hemorrhage better characterized on recent CT head. 3. Small remote cerebellar and bilateral thalamic lacunar infarcts and chronic microvascular ischemic disease. Electronically Signed   By: Gilmore GORMAN Molt M.D.   On: 03/05/2024 03:05   DG Abd 1 View Result Date: 03/04/2024 CLINICAL DATA:  Encounter for feeding tube placement. EXAM: ABDOMEN - 1 VIEW COMPARISON:  None Available. FINDINGS: Feeding tube placement was performed by radiology technologist. No radiologist was involved in this exam. Four fluoroscopic spot images submitted. A 10 French Cortrak tube was placed, tip appears to be post pyloric within the distal duodenum. 20 cc of Omnipaque  300 was injected through the tube. Fluoroscopy time 2 minutes 54 seconds. Dose 13.5 mGy. IMPRESSION: Feeding tube placement with tip post pyloric in the proximal duodenum. Electronically Signed   By: Andrea Gasman M.D.   On: 03/04/2024 15:59   CT HEAD WO CONTRAST ( ) Result Date: 03/04/2024 CLINICAL DATA:  43 year old female dialysis patient with acute intracranial hemorrhage on 02/14/2024. Subarachnoid  and subdural blood. External ventricular drain removed. EXAM: CT HEAD WITHOUT CONTRAST TECHNIQUE: Contiguous axial images were obtained from the base of the skull through the vertex without intravenous contrast. RADIATION DOSE REDUCTION: This exam was performed according to the departmental dose-optimization program which includes automated exposure control, adjustment of the mA and/or kV according to patient size and/or use of iterative reconstruction technique. COMPARISON:  Head CT 02/20/2024 and earlier. FINDINGS: Brain: Right temporal horn pneumo ventricle. That temporal horn mildly dilated now, but otherwise no significant change in lateral ventricle size. Fourth ventricle appears larger on sagittal image 30. Third ventricle also slightly larger on coronal image 34. No transependymal edema. Trace residual layering IVH in the occipital horns. Gas also along the EVD tract. No midline shift. Basilar cisterns are patent. Trace residual subarachnoid blood. No convincing subdural hematoma. Heterogeneous gray and white matter in the left parietal lobe in an area of 2 cm on sagittal image 39 is new, resembles mild cerebral edema but there is no regional mass effect. Gray-white differentiation elsewhere is maintained. Small area of chronic encephalomalacia in the left cerebellar tonsil is stable. Vascular: Calcified atherosclerosis at the skull base. No suspicious intracranial vascular hyperdensity. Skull: Right superior frontal burr hole.  Otherwise intact. Sinuses/Orbits: Left nasoenteric tube in place. Visualized paranasal sinuses and mastoids are stable and well aerated. Other: Postoperative changes to the right scalp vertex. No new orbit or scalp soft tissue finding. Calcified scalp vessel atherosclerosis. IMPRESSION: 1. Right frontal approach EVD removed with mild pneumocephalus and right temporal horn pneumo ventricle. The right temporal horn, lateral and 3rd ventricles are mildly larger since 02/20/2024. Trace  residual SAH, IVH and  no transependymal edema. 2. New 2 cm area of heterogeneous hypodensity in the left parietal lobe resembling cerebral edema. No regional or intracranial mass effect. 3. Stable gray-white differentiation elsewhere, including mild chronic encephalomalacia in the left inferior cerebellum. Electronically Signed   By: VEAR Hurst M.D.   On: 03/04/2024 11:35   VAS US  TRANSCRANIAL DOPPLER Result Date: 03/03/2024  Transcranial Doppler Patient Name:  DORIAN RENFRO  Date of Exam:   03/03/2024 Medical Rec #: [id number]           Accession #:    [id number] Date of Birth: 08/12/1981            Patient Gender: F Patient Age:   43 years Exam Location:  [facility] Procedure:      VAS US  TRANSCRANIAL DOPPLER Referring Phys: ARY XU --------------------------------------------------------------------------------  Indications: Subarachnoid hemorrhage. Limitations: patient positioning Comparison Study: 02/18/2024, 02/20/2024, 02/22/2024, 02/26/2024, 02/29/2024 Performing Technologist: Cordella Collet RVT  Examination Guidelines: A complete evaluation includes B-mode imaging, spectral Doppler, color Doppler, and power Doppler as needed of all accessible portions of each vessel. Bilateral testing is considered an integral part of a complete examination. Limited examinations for reoccurring indications may be performed as noted.  +----------+---------------+----------+-----------+------------------+ RIGHT TCD Right VM (cm/s)Depth (cm)Pulsatility     Comment       +----------+---------------+----------+-----------+------------------+ MCA             45                    1.12                       +----------+---------------+----------+-----------+------------------+ ACA             -37                   1.59                       +----------+---------------+----------+-----------+------------------+ Term ICA        36                    1.54                        +----------+---------------+----------+-----------+------------------+ PCA P1          24                    1.47                       +----------+---------------+----------+-----------+------------------+ Opthalmic       31                    2.01                       +----------+---------------+----------+-----------+------------------+ ICA siphon                                    Unable to insonate +----------+---------------+----------+-----------+------------------+ Vertebral       -15                    1.5                       +----------+---------------+----------+-----------+------------------+ Distal ICA      -  10                   1.56                       +----------+---------------+----------+-----------+------------------+  +----------+--------------+----------+-----------+------------------+ LEFT TCD  Left VM (cm/s)Depth (cm)Pulsatility     Comment       +----------+--------------+----------+-----------+------------------+ MCA             25                   1.09                       +----------+--------------+----------+-----------+------------------+ ACA            -27                   2.13                       +----------+--------------+----------+-----------+------------------+ Term ICA                                     Unable to insonate +----------+--------------+----------+-----------+------------------+ PCA P1                                       Unable to insonate +----------+--------------+----------+-----------+------------------+ Opthalmic       15                   1.76                       +----------+--------------+----------+-----------+------------------+ ICA siphon      14                   1.57                       +----------+--------------+----------+-----------+------------------+ Vertebral      -18                   1.35                        +----------+--------------+----------+-----------+------------------+ Distal ICA     -23                   1.39                       +----------+--------------+----------+-----------+------------------+  +------------+-------+------------------+             VM cm/s     Comment       +------------+-------+------------------+ Prox Basilar       Unable to insonate +------------+-------+------------------+ Dist Basilar       Unable to insonate +------------+-------+------------------+ +----------------------+---+ Right Lindegaard Ratio4.5 +----------------------+---+ +---------------------+----+ Left Lindegaard Ratio1.08 +---------------------+----+  Summary: This was a normal transcranial Doppler study, with normal flow direction and velocity of all identified vessels of the anterior and posterior circulations, with no evidence of stenosis, vasospasm or occlusion. There was no evidence of intracranial disease. Elevated pulsatility indices suggest increased intracranial pressure or diffuse intracranial atherosclerosis *See table(s) above for TCD measurements and observations.  Diagnosing physician: Eather Popp MD Electronically signed by Eather Popp MD on 03/03/2024 at 4:19:26 PM.  Final     Assessment/Plan: Marked hemorrhage, intraventricular hemorrhage, hydrocephalus, cerebral infarct, left parietal infarct: The patient's ventriculomegaly is mild.  She does not need a ventriculostomy presently.  We will likely need to repeat her CAT scan in a few days to rule out hydrocephalus.  I will leave it up to neurology's expertise whether she needs to be anticoagulated for her ischemic strokes.  LOS: 20 days     [name] 03/05/2024, 8:49 AM     Patient ID: [name], female   DOB: [birth year], 43 y.o.   MRN: [id number]

## 2024-03-05 NOTE — TOC Progression Note (Signed)
 Transition of Care [facility]) - Progression Note    Patient Details  Name: [name] [name] MRN: [id number] Date of Birth: 1980/09/15  [facility] CM/SW Contact  Inocente GORMAN Kindle, LCSW Phone Number: 03/05/2024, 9:05 AM  Clinical Narrative:    CSW continuing to follow.    Expected Discharge Plan: Skilled Nursing Facility Barriers to Discharge: Continued Medical Work up, English as a second language teacher, SNF Pending bed offer  Expected Discharge Plan and Services                                               Social Determinants of Health (SDOH) Interventions SDOH Screenings   Food Insecurity: Patient Unable To Answer (02/15/2024)  Housing: Unknown (02/15/2024)  Transportation Needs: Patient Unable To Answer (02/15/2024)  Utilities: Patient Unable To Answer (02/15/2024)  Social Connections: Unknown (10/04/2022)   Received from [facility]  Tobacco Use: Medium Risk (02/13/2024)    Readmission Risk Interventions    11/17/2021   12:15 PM  Readmission Risk Prevention Plan  Transportation Screening Complete  Medication Review (RN Care Manager) Complete  PCP or Specialist appointment within 3-5 days of discharge Complete  HRI or Home Care Consult Complete  SW Recovery Care/Counseling Consult Complete  Palliative Care Screening Not Applicable  Skilled Nursing Facility Patient Refused

## 2024-03-05 NOTE — Progress Notes (Addendum)
 STROKE TEAM PROGRESS NOTE    SIGNIFICANT HOSPITAL EVENTS 6/11- ED for nausea, vomiting after dialysis 6/12- intubated, EVD placed  6/14 - extubated 6/17 - Acute left cerebellar infarct 6/19 - vasospasm seen on TCD 6/21 - CSF sampling from EVD with WBC 244 and RBC 19000 6/25 - angiogram with diffuse mild-moderate anterior circulation vasospasm  INTERIM HISTORY/SUBJECTIVE Reconsulted for concern for seizures. More lethargic, difficult to arouse. Having episodes of bradycardia. RN noted jaw clenched and somnolence. She was given narcan  initially with no response. She was later given a second dose of narcan  with no immediate response but some gradual improvement after 5 mins. However, still quite lethargic and difficult to arouse for me.  Long-term EEG monitoring overnight showed moderate to severe diffuse encephalopathy but no definite seizure activity. MRI scan of the brain has been read [facility] suggest left parietal subacute infarct with edema however upon further reviewing there are changes of laminar necrosis in that region and diffusion positive signal is likely related to artifact from blood products and notable that diffusion signal follows cortical gyri suggesting seizure pattern versus amyloid necrosis rather than ischemia. OBJECTIVE  CBC    Component Value Date/Time   WBC 4.3 03/05/2024 0459   RBC 2.86 (L) 03/05/2024 0459   HGB 8.8 (L) 03/05/2024 0459   HCT 27.1 (L) 03/05/2024 0459   PLT 227 03/05/2024 0459   MCV 94.8 03/05/2024 0459   MCH 30.8 03/05/2024 0459   MCHC 32.5 03/05/2024 0459   RDW 14.6 03/05/2024 0459   LYMPHSABS 1.2 02/20/2024 1125   MONOABS 0.7 02/20/2024 1125   EOSABS 0.0 02/20/2024 1125   BASOSABS 0.0 02/20/2024 1125    BMET    Component Value Date/Time   NA 134 (L) 03/05/2024 0459   K 4.3 03/05/2024 0459   CL 88 (L) 03/05/2024 0459   CO2 21 (L) 03/05/2024 0459   GLUCOSE 89 03/05/2024 0459   BUN 37 (H) 03/05/2024 0459   CREATININE 4.82 (H) 03/05/2024  0459   CALCIUM  10.8 (H) 03/05/2024 0459   GFRNONAA 11 (L) 03/05/2024 0459    IMAGING past 24 hours Overnight EEG with video Result Date: 03/05/2024 Shelton Arlin KIDD, MD     03/05/2024  8:45 AM Patient Name: [name] [name] MRN: [id number] Epilepsy Attending: Arlin KIDD Shelton Referring Physician/Provider: Vanessa Robert, MD Duration: 03/05/2024 0517 to 0845 Patient history:  43 y.o. female with history of hypertension, ESRD on HD, s/p renal and pancreatic transplant, diabetes mellitus type 1 with gastroparesis presents with nausea and vomiting since after dialysis. Admitted initially for sepsis felt to be secondary to colitis. Found to have perimesencephalic SAH with IVH suspected to be either hypertensive in etiology vs secondary to peripontine venous anomaly. This was complicated by hydropcehalus s/p EVD placement. Imaging also demonstrated a small left cerbellar infarct, presumed secondary to vasospasm. Neurology reconsulted overnight for possible seizures. EEG to evaluate for seizure. Level of alertness:  comatose/ lethargic AEDs during EEG study: None Technical aspects: This EEG study was done with scalp electrodes positioned according to the 10-20 International system of electrode placement. Electrical activity was reviewed with band pass filter of 1-70Hz , sensitivity of 7 uV/mm, display speed of 38mm/sec with a 60Hz  notched filter applied as appropriate. EEG data were recorded continuously and digitally stored.  Video monitoring was available and reviewed as appropriate. Description: EEG showed continuous generalized polymorphic sharply contoured 3 to 6 Hz theta-delta slowing. Hyperventilation and photic stimulation were not performed.   ABNORMALITY - Continuous slow, generalized IMPRESSION: This  study is suggestive of moderate to severe diffuse encephalopathy. No seizures or epileptiform discharges were seen throughout the recording. Arlin MALVA Krebs   MR BRAIN WO CONTRAST Result Date:  03/05/2024 CLINICAL DATA:  Mental status change, unknown cause EXAM: MRI HEAD WITHOUT CONTRAST TECHNIQUE: Multiplanar, multiecho pulse sequences of the brain and surrounding structures were obtained without intravenous contrast. COMPARISON:  MRI head February 17, 2024. CT head from earlier today. FINDINGS: Brain: The area of hypodensity seen on recent CT head correlates with peripheral cortical restricted diffusion and edema, compatible with acute or early subacute infarct. Susceptibility artifact and T2 hypointensity in the right temporal horn is consistent with pneumo ventricle and similar cross modalities to recent CT head. Right frontal approach EVD in place with similar size of the ventricles which remain mildly enlarged. Layering signal abnormality within the lateral ventricles is consistent with intraventricular hemorrhage which is also similar across modalities. Similar small focus of encephalomalacia in the cerebellum. Small remote lacunar infarcts in the thalami. Scattered T2/FLAIR hyperintensities in the white matter, compatible with chronic microvascular ischemic disease. Vascular: Normal flow voids. Skull and upper cervical spine: Normal marrow signal. Sinuses/Orbits: Mild paranasal sinus mucosal thickening. No acute orbital findings. IMPRESSION: 1. Left parietal acute or early subacute infarct with associated edema. No substantial mass effect. 2. Right frontal approach EVD with similar pneumoventricle, mild ventriculomegaly, and small volume intraventricular hemorrhage. Scattered small volume of subarachnoid hemorrhage better characterized on recent CT head. 3. Small remote cerebellar and bilateral thalamic lacunar infarcts and chronic microvascular ischemic disease. Electronically Signed   By: Gilmore GORMAN Molt M.D.   On: 03/05/2024 03:05   DG Abd 1 View Result Date: 03/04/2024 CLINICAL DATA:  Encounter for feeding tube placement. EXAM: ABDOMEN - 1 VIEW COMPARISON:  None Available. FINDINGS: Feeding tube  placement was performed by radiology technologist. No radiologist was involved in this exam. Four fluoroscopic spot images submitted. A 10 French Cortrak tube was placed, tip appears to be post pyloric within the distal duodenum. 20 cc of Omnipaque  300 was injected through the tube. Fluoroscopy time 2 minutes 54 seconds. Dose 13.5 mGy. IMPRESSION: Feeding tube placement with tip post pyloric in the proximal duodenum. Electronically Signed   By: Andrea Gasman M.D.   On: 03/04/2024 15:59   CT HEAD WO CONTRAST ( ) Result Date: 03/04/2024 CLINICAL DATA:  43 year old female dialysis patient with acute intracranial hemorrhage on 02/14/2024. Subarachnoid and subdural blood. External ventricular drain removed. EXAM: CT HEAD WITHOUT CONTRAST TECHNIQUE: Contiguous axial images were obtained from the base of the skull through the vertex without intravenous contrast. RADIATION DOSE REDUCTION: This exam was performed according to the departmental dose-optimization program which includes automated exposure control, adjustment of the mA and/or kV according to patient size and/or use of iterative reconstruction technique. COMPARISON:  Head CT 02/20/2024 and earlier. FINDINGS: Brain: Right temporal horn pneumo ventricle. That temporal horn mildly dilated now, but otherwise no significant change in lateral ventricle size. Fourth ventricle appears larger on sagittal image 30. Third ventricle also slightly larger on coronal image 34. No transependymal edema. Trace residual layering IVH in the occipital horns. Gas also along the EVD tract. No midline shift. Basilar cisterns are patent. Trace residual subarachnoid blood. No convincing subdural hematoma. Heterogeneous gray and white matter in the left parietal lobe in an area of 2 cm on sagittal image 39 is new, resembles mild cerebral edema but there is no regional mass effect. Gray-white differentiation elsewhere is maintained. Small area of chronic encephalomalacia in the left  cerebellar tonsil is stable. Vascular: Calcified atherosclerosis at the skull base. No suspicious intracranial vascular hyperdensity. Skull: Right superior frontal burr hole.  Otherwise intact. Sinuses/Orbits: Left nasoenteric tube in place. Visualized paranasal sinuses and mastoids are stable and well aerated. Other: Postoperative changes to the right scalp vertex. No new orbit or scalp soft tissue finding. Calcified scalp vessel atherosclerosis. IMPRESSION: 1. Right frontal approach EVD removed with mild pneumocephalus and right temporal horn pneumo ventricle. The right temporal horn, lateral and 3rd ventricles are mildly larger since 02/20/2024. Trace residual SAH, IVH and no transependymal edema. 2. New 2 cm area of heterogeneous hypodensity in the left parietal lobe resembling cerebral edema. No regional or intracranial mass effect. 3. Stable gray-white differentiation elsewhere, including mild chronic encephalomalacia in the left inferior cerebellum. Electronically Signed   By: VEAR Hurst M.D.   On: 03/04/2024 11:35       Vitals:   03/05/24 0807 03/05/24 0815 03/05/24 0830 03/05/24 0845  BP: (!) 153/82 (!) 155/82 (!) 166/77 138/89  Pulse: 76 83 92 95  Resp: 16 (!) 3 18 17   Temp:      TempSrc:      SpO2: 100% 100% 100% 100%  Weight:      Height:         PHYSICAL EXAM General: Alert, ill-appearing patient, NAD CV: ST, 110s Respiratory: room air, unlabored  NEURO:  Patient is alert and moves all 4 extremities spine continuously.  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  She does move all 4 extremities against gravity but moves right side more than left.  ASSESSMENT/PLAN  [name] [name] is a 43 y.o. female with history of hypertension, ESRD on HD, s/p renal and pancreatic transplant, diabetes  mellitus type 1 with gastroparesis presents with nausea and vomiting since after dialysis.  NIH on Admission 27  Perimesencephalic SAH with IVH - hypertensive vs. Prepontine venous anomaly not seen on angiogram New complaints of jaw clenching and increased somnolence raising concerns for seizures.  EEG shows only moderate diffuse encephalopathy Code Stroke CT head - Hemorrhage involving the prepontine cistern extending into the upper cervical spinal canal. Intraventricular blood products layering in the occipital horns.  CTA head & neck - No large vessel occlusion. No high-grade stenosis, aneurysm, or dissection of the arteries in the head and neck. Mild stenosis of the V4 segments of both vertebral arteries. Mild stenosis of the right supraclinoid ICA.  6/12 repeat CT Head- Interval placement of a right frontal approach ventriculostomy with tip in the body of the right lateral ventricle. Persistent but relatively stable hydrocephalus at this time. Moderate volume acute subarachnoid hemorrhage with associated IVH, similar to prior. No evidence for significant interval bleeding since prior. MRI  Small acute anterior corpus callosum infarct Mildly improved hydrocephalus with EVD in place Decreased perimesencephalic subarachnoid hemorrhage and IVH Trace supratentorial and infratentorial subdural hemorrhage Age advanced chronic small vessel ischemic disease with chronic lacunar infarcts Repeat CT 6/18: Stable multicompartment acute hemorrhage.  No progressive mass effect or new acute hemorrhage.  Stable ventricular size with right frontal approach. EEG- negative for seizures or epileptiform discharges 2D Echo EF 60-65% Cerebral angiogram showed relatively diffuse mild-moderate anterior circulation vasospasm  LDL 63 HgbA1c 4.2 VTE prophylaxis - heparin  suq No antithrombotic prior to admission, now on No antithrombotic  Therapy recommendations:  CIR Disposition:  Pending   Vasospasm with left  cerebellar infarct CTA head and neck 6/12 - mild irregularity of the BA without significant narrowing, concerning for mild vasospasm MRI 6/15 Small acute anterior corpus callosum infarct CTA head and neck 6/19 substantially decreased caliber of multiple intracranial arteries and bilateral circle-of-Willis branches (especially: Distal left V4, supraclinoid ICAs, MCA and ACA origins) when compared to 02/14/2024. The appearance most resembles a widespread Vasospasm, Vasoconstriction.  TCD monitoring  6/16: No definite evidence of vasospasm noted in identified blood vessels.  6/18: elevated mean flow velocities in right middle cerebral artery suggestive of moderate and left middle cerebral artery suggestive of mild vasospasm.  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  NSGY consulted Keppra  discontinued 6/17 EVD placement 6/12, continued per NS Plan to elevate EVD and perhaps remove over the next few days   Fever in the setting of EVD and immunosuppressant use Fever since 6/15, Tmax 101.7-102.2-102.9-101.7-103.1-102.5-101.2-100-99.3 WBC 9.4--8.8--7.5--5.9 On cyclosporine , cellcept  and prednisone  CSF sampling from EVD with WBC 244 and RBC 19000, culture NGTD On meropenem  and vanco -> ancef   Respiratory failure  Aspiration pneumonia PCCM primary Intubated 6/12, extubated 6/14 Continued on Zosyn  for aspiration and colitis, 8-day course--completed CXR 6/19: Improving pneumonia  Hypertensive Emergency  Now with Mild Induced Hypertension Home meds:  Norvasc , coreg , clonidine  patch, hydralazine   Stable on the high end Off Cleviprex , labetalol  PRN Now off levophed   (6/21) BP goal 140-160 Continue nimodipine   Long term BP goal normotensive  Diabetes Type 1 Hx of pancreas transplant  Chronically on CellCept , cyclosporine  and prednisone  HgbA1c 4.2, goal < 7.0 CBGs, SSI  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 Narcotics dependence Chronic pain Per pt she was using large dose of narcotics at home Resume home oxycodone  5mg  Q12h->Q8h -> discontinued 7/1 due to lethargy Narcan  on 7/1 with some improvement  Fioricet  PRN Monitoring pain level  Other medical issues DM gastroparesis  Colitis, completed course of Zosyn  Thrombocytopenia, improved - platelet [phone] Additional Labs sent, B12, Folte, RPR, TSH, Ammonia--WNL  Hospital day # 20  Patient seen and examined by NP/APP with MD. MD to update note as needed.   Jorene Last, DNP, FNP-BC [name] Pager: [phone]  I have personally obtained history,examined this patient, reviewed notes, independently viewed imaging studies, participated in medical decision making and plan of care.ROS completed by me personally and pertinent positives fully documented  I have made any additions or clarifications directly to the above note. Agree with note above.  Stroke team reconsulted for concerns for seizures due to increased lethargy with episodes of bradycardia and jaw clenching however EEG shows only moderate diffuse slowing however MRI scan shows remote age blood products in the left parietal region with areas of laminar necrosis and diffusion positivity of the posterior left parietal cortex gyriform pattern which is not concerning for seizures and recommend starting Vimpat 100  twice daily instead of Keppra  due to concerns  about QT prolongation.  No family available at the bedside.  Discussed with Dr. Volanda critical care medicine This patient is critically ill and at significant risk of neurological worsening, death and care requires constant monitoring of vital signs, hemodynamics,respiratory and cardiac monitoring, extensive review of multiple databases, frequent neurological assessment, discussion with family, other specialists and medical decision making of high complexity.I have made any additions or clarifications directly to the above note.This critical care time does not reflect procedure time, or teaching time or supervisory time of PA/NP/Med Resident etc but could involve care discussion time.  I spent 30 minutes of neurocritical care time  in the care of  this patient.     Eather Popp, MD Medical Director [facility] Pager: [phone] 03/05/2024 3:21 PM

## 2024-03-05 NOTE — Procedures (Signed)
 Patient Name: [name] [name]  MRN: [id number]  Epilepsy Attending: Arlin MALVA Krebs  Referring Physician/Provider: Khaliqdina, Salman, MD  Duration: 03/05/2024 9482 to 03/06/2024 9482  Patient history:  43 y.o. female with history of hypertension, ESRD on HD, s/p renal and pancreatic transplant, diabetes mellitus type 1 with gastroparesis presents with nausea and vomiting since after dialysis. Admitted initially for sepsis felt to be secondary to colitis. Found to have perimesencephalic SAH with IVH suspected to be either hypertensive in etiology vs secondary to peripontine venous anomaly. This was complicated by hydropcehalus s/p EVD placement. Imaging also demonstrated a small left cerbellar infarct, presumed secondary to vasospasm. Neurology reconsulted overnight for possible seizures. EEG to evaluate for seizure.   Level of alertness:  comatose/ lethargic   AEDs during EEG study: None  Technical aspects: This EEG study was done with scalp electrodes positioned according to the 10-20 International system of electrode placement. Electrical activity was reviewed with band pass filter of 1-70Hz , sensitivity of 7 uV/mm, display speed of 67mm/sec with a 60Hz  notched filter applied as appropriate. EEG data were recorded continuously and digitally stored.  Video monitoring was available and reviewed as appropriate.  Description: EEG showed continuous generalized polymorphic sharply contoured 3 to 6 Hz theta-delta slowing, appeared more sharply contoured when awake/stimulated.  Sharp waves were also noted in left centro- parietal region. hyperventilation and photic stimulation were not performed.     ABNORMALITY -Sharp waves, left centro- parietal region - Continuous slow, generalized  IMPRESSION: This study showed evidence of epileptogenicity arising from left centro- parietal region. Additionally there is moderate to severe diffuse encephalopathy. No seizures were seen throughout the  recording.  Aedyn Mckeon O Alisah Grandberry

## 2024-03-05 NOTE — Progress Notes (Signed)
 NAME:  [name] [name], MRN:  [id number], DOB:  [birth year], LOS: 20 ADMISSION DATE:  02/13/2024, CONSULTATION DATE:  02/14/2024 REFERRING MD:  Claudene, CHIEF COMPLAINT:  AMS    History of Present Illness:  43 year old woman with ESRD on HD, HTN, failed renal txp,  pancreatic txp on mycophenolate  and pred 15, DM1 w gastroparesis who presented to ED 6/11 with n/v, abd pain. Hypertensive in ED SBP 240s. Went for CT a/p and was c/f some colitis. Started on abx, given IVF. C/o pain and was very agitated, rcvd pain meds. Admitted 6/12. Over the course of the day she became less responsive. Remained hypertensive w SBPs >200. Incr O2 req to [facility]. Pt was to rcv HD 6/12 afternoon but when eval by HD NP, concerns for airway protection. Rcvd narcan  with only modest improvement. PCCM consulted in this setting.  Pertinent Medical History:  Failed renal / pancreas txp ESRD  HTN DM1 gastroparesis   Significant Hospital Events: Including procedures, antibiotic start and stop dates in addition to other pertinent events   6/11 ED w n/v AMS. Abx IVF for colitis 6/12 Admit. AMS decr LOC. Incr O2 req. PCCM consult and code stroke called seen w rapid response nephro NP then neuro  6/13 Intubated with EVD, responding to commands  6/14 Extubated 6/17 acute L cerebellar infarct. Keppra  dc  6/18 Dialysis, fevering, EVD at 10 (129 mL last 24 hrs) 6/19 Narcotic dependence a driving factor. +Vasospasm on TCD. CTA with evolving Left cerebellar infarct, no HT or mass effect, substantially decreased caliber of multiple intracranial arteries and bilateral Circle-of-Willis branches (especially: Distal left V4, supraclinoid ICAs, MCA and ACA origins), appearance c/w widespread Vasospasm, Vasoconstriction. No associated large vessel occlusion. 6/21 CSF sampline from EVD -- WBC 244 RBC 19000, cx ngtd  6/25: diagnostic angio today with nsgy - negative 6/26 evd leaking when clamped  6/30 evd removed, HD overnight, HTN improved,  7 days cefazolin  complete 7/1 more lethargic> minimal response to narcan  x2, oxy stopped, EEG neg, MRI - new small left parietal cva, cortrak placed postpyloric   Interim History / Subjective:  Bradycardic episode f/b vomiting episode- minimal clear emesis, no TF Remains on LTM iHD this am> goal UF 2L  Objective:  Blood pressure (!) 166/70, pulse 93, temperature 97.8 F (36.6 C), temperature source Axillary, resp. rate (!) 5, height 5' 7.5 (1.715 m), weight 56.5 kg, last menstrual period 02/13/2024, SpO2 100%.        Intake/Output Summary (Last 24 hours) at 03/05/2024 0913 Last data filed at 03/05/2024 0000 Gross per 24 hour  Intake --  Output 1400 ml  Net -1400 ml   Filed Weights   03/01/24 0100 03/02/24 0500 03/05/24 0800  Weight: 56.6 kg 56.5 kg 56.5 kg   Physical Examination: General:  ill appearing adult female sitting up in bed in NAD in iHD via RUE AVF HEENT: MM pink/moist Neuro: disconjugate gaze 3/r, mumbles, moves all extremities spont, ?if on commands on RLE CV: NSR, rr, no murmur PULM:  non labored, clear GI: soft, bs hyper, FMS Extremities: warm/dry, no tibial edema  Skin: no rashes   Afebrile    Resolved problem List:  Hypertensive emergency  Acute respiratory failure w hypoxia Aspiration PNA (completed rx 6/19)  Assessment and Plan:   ICH with brain compression Subarachnoid hemorrhage Communicating hydrocephalus s/p EVD removed 6/30 Left cerebellar infarct, 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  R/o seizure  New small left parietal CVA, ? If related to seizures  Hypertension - completed 7  days ancef  7/1 - per Neurology/ NSGY - MRI 7/1 with new small parietal CVA, mild ventriculometry and pneumocephaly - LTM per Neuro> overnight. No seizures/ epileptiform discharges, mod to severe diffuse encephalopathy  - AEDs per Neurology - cont SBP goal 140-160, BP's liable at times.  Midodrine  5mg  TID prn, cont holding pta norvasc , coreg , clonidine  patch - prn  hydralazine  - nimodipine / TCDs per NSGY - repeat imaging TBD per NSGY - ammonia wnl - serial neuro exams/ neuroprotective measures - PT/ OT/ SLP as appropriate    End-stage renal disease on hemodialysis Chronic immunosuppression with CellCept , cyclosporine , prednisone  - History of failed transplant - per Nephrology - iHD currently - trend renal indices - cont cellcept , cyclosporine , prednisone     Diarrhea related to colitis - decreasing stool output, prn loperamide  - remains afebrile, WBC WNL   Bradycardia- suspected vagal response, always associated with vomiting episodes  Nausea with vomiting - Continue reglan  - cortrak post pyloric 7/1 - tele monitoring, atropine if needed  - aspiration precautions    S/p pancreatic transplant Diabetes - Continue CBG q4/ prn  - SSI prn > glucose has been 80-90's - lipase wnl - suppression meds as above   Anemia of chronic illness - stable, trend CBC  At risk for malnutrition - TF per EN/ RD.  If ongoing vomiting, consider changing from nephro to vital to see if better tolerated  Chest pain: vague complaint - EKG non acute, flat trops 32> 31  Best Practice (right click and Reselect all SmartList Selections daily)   Diet/type: tubefeeds  DVT prophylaxis prophylactic heparin   and ASA started 6/20 GI prophylaxis: N/A Lines: N/A  Foley:  N/A Code Status:  full code Last date of multidisciplinary goals of care discussion [Friend updated 6/12. Patient[place].]  Pending update 7/2, no family at bedside  Critical care time 40 minutes     Lyle Pesa, MSN, AG-ACNP-BC [facility] 03/05/2024, 9:13 AM  See Amion for pager If no response to pager , please call [phone] until 7pm After 7:00 pm call Elink  [phone]

## 2024-03-05 NOTE — Progress Notes (Signed)
 Speech Language Pathology Treatment: Dysphagia  Patient Details Name: [name] [name] MRN: [id number] DOB: 08-12-1981 Today's Date: 03/05/2024 Time: 8461-8449 SLP Time Calculation (min) (ACUTE ONLY): 12 min  Assessment / Plan / Recommendation Clinical Impression  Patient seen by SLP for skilled treatment focused on dysphagia goals/PO readiness. She was awake, alert, with delayed responses and requiring frequent cues to initiate response. RN assisted with repositioning patient further up in bed. During oral care she was noted to have some retained saliva/secretions in oral cavity. SLP assessed her toleration of thin liquids (apple juice) via spoon sips, then cup sips and finally straw sips. Patient readily accepted spoon and cup administration but with straw she required cues to initiate and maintain adequate suction. With all delivery methods, patient with oral holding of liquids and suspected swallow initiation delays. No overt s/s aspiration. SLP recommending to continue NPO but to continue allowing floor stock thin liquids and puree/pudding solids. SLP will continue to follow.   HPI HPI: Patient is a 43 y.o. female with PMH: ESRD on HD, s/p renal and pancreatic transplant, DM-1 with gastroparesis, anxiety, narcotic abuse, vision loss, non-compliance with medical treatment. She presented to the hospital on 02/14/24 with nausea and vomiting which started after HD with patient developing severe abdominal pain after completing nearly all of her HD session. In ED, she had temp of 95.9 degrees F with tachypnea, elevated BP and SpO2 as low as 89% despite being on HFNC. She was admitted with possible sepsis secondary to colitis, acute respiratory failure with hypoxia, acute metabolic encephalopathy. 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 CRRT 6/13-15, EVD placed on 6/12. She was emergently intubated on 6/12 and extubated on 6/14. She has been NPO awaiting SLP swallow evaluation.       SLP Plan  Continue with current plan of care          Recommendations  Diet recommendations: NPO;Other(comment) (floor stock thins and purees) Liquids provided via: Straw;Teaspoon Medication Administration: Via alternative means Supervision: Trained caregiver to feed patient;Full supervision/cueing for compensatory strategies Postural Changes and/or Swallow Maneuvers: Seated upright 90 degrees                  Oral care before and after PO;Oral care QID   Frequent or constant Supervision/Assistance Dysphagia, unspecified (R13.10)     Continue with current plan of care     Norleen IVAR Blase, MA, CCC-SLP Speech Therapy

## 2024-03-05 NOTE — Progress Notes (Signed)
 PT Cancellation Note  Patient Details Name: [name] [name] MRN: [id number] DOB: Jun 09, 1981   Cancelled Treatment:    Reason Eval/Treat Not Completed: (P) Patient not medically ready. Pt currently having HD session and RN requesting PT hold off today due to pt being apneic and bradycardic and generally medically unstable at this time. Will plan to follow-up another day as able.   Theo Ferretti, PT, DPT Acute Rehabilitation Services  Office: [phone]    Theo CHRISTELLA Ferretti 03/05/2024, 10:32 AM

## 2024-03-05 NOTE — Progress Notes (Signed)
   03/05/24 1152  Vitals  Temp 97.6 F (36.4 C)  Temp Source Oral  BP (!) 149/94  MAP (mmHg) 109  BP Location Left Arm  BP Method Automatic  Patient Position (if appropriate) Lying  Pulse Rate (!) 101  Pulse Rate Source Monitor  ECG Heart Rate (!) 101  Resp 17  Weight 54.5 kg  Type of Weight Post-Dialysis  Oxygen  Therapy  SpO2 100 %  O2 Device Nasal Cannula  O2 Flow Rate (L/min) 3 L/min  End Tidal CO2 (EtCO2) 32  Post Treatment  Dialyzer Clearance Lightly streaked  Hemodialysis Intake (mL) 0 mL  Liters Processed 61.1  Fluid Removed (mL) 2000 mL  Tolerated HD Treatment Yes  Post-Hemodialysis Comments tx completed goal met  AVG/AVF Arterial Site Held (minutes) 7 minutes  AVG/AVF Venous Site Held (minutes) 7 minutes  Note  Patient Observations alert more responsive  Fistula / Graft Right Upper arm Arteriovenous fistula  Placement Date/Time: (c) 12/08/20 1403   Placed prior to admission: Yes  Orientation: Right  Access Location: Upper arm  Access Type: Arteriovenous fistula  Site Condition No complications  Fistula / Graft Assessment Present;Thrill;Bruit  Status Deaccessed  Needle Size 15  Drainage Description None   Received patient in bed to unit.  Alert and oriented.  Informed consent signed and in chart.   TX duration:  Patient tolerated well.    Alert, without acute distress.  Hand-off given to patient's nurse.   Access used: RUA fistula Access issues: venous pressure high readjusted throughout tx  Total UF removed: 2000 Medication(s) given: none Post HD VS: see above Post HD weight: 54.6KG   Docia CHRISTELLA [facility]

## 2024-03-06 ENCOUNTER — Inpatient Hospital Stay (HOSPITAL_COMMUNITY)

## 2024-03-06 ENCOUNTER — Encounter (HOSPITAL_COMMUNITY): Payer: Self-pay | Admitting: Internal Medicine

## 2024-03-06 DIAGNOSIS — G9340 Encephalopathy, unspecified: Secondary | ICD-10-CM | POA: Diagnosis not present

## 2024-03-06 DIAGNOSIS — R569 Unspecified convulsions: Secondary | ICD-10-CM | POA: Diagnosis not present

## 2024-03-06 DIAGNOSIS — I63542 Cerebral infarction due to unspecified occlusion or stenosis of left cerebellar artery: Secondary | ICD-10-CM | POA: Diagnosis not present

## 2024-03-06 DIAGNOSIS — I609 Nontraumatic subarachnoid hemorrhage, unspecified: Secondary | ICD-10-CM | POA: Diagnosis not present

## 2024-03-06 DIAGNOSIS — I615 Nontraumatic intracerebral hemorrhage, intraventricular: Secondary | ICD-10-CM | POA: Diagnosis not present

## 2024-03-06 LAB — GLUCOSE, CAPILLARY
Glucose-Capillary: 119 mg/dL — ABNORMAL HIGH (ref 70–99)
Glucose-Capillary: 122 mg/dL — ABNORMAL HIGH (ref 70–99)
Glucose-Capillary: 125 mg/dL — ABNORMAL HIGH (ref 70–99)
Glucose-Capillary: 126 mg/dL — ABNORMAL HIGH (ref 70–99)
Glucose-Capillary: 127 mg/dL — ABNORMAL HIGH (ref 70–99)
Glucose-Capillary: 133 mg/dL — ABNORMAL HIGH (ref 70–99)
Glucose-Capillary: 143 mg/dL — ABNORMAL HIGH (ref 70–99)
Glucose-Capillary: 158 mg/dL — ABNORMAL HIGH (ref 70–99)

## 2024-03-06 LAB — RENAL FUNCTION PANEL
Albumin: 3 g/dL — ABNORMAL LOW (ref 3.5–5.0)
Anion gap: 20 — ABNORMAL HIGH (ref 5–15)
BUN: 25 mg/dL — ABNORMAL HIGH (ref 6–20)
CO2: 25 mmol/L (ref 22–32)
Calcium: 10.8 mg/dL — ABNORMAL HIGH (ref 8.9–10.3)
Chloride: 90 mmol/L — ABNORMAL LOW (ref 98–111)
Creatinine, Ser: 3.93 mg/dL — ABNORMAL HIGH (ref 0.44–1.00)
GFR, Estimated: 14 mL/min — ABNORMAL LOW (ref >60)
Glucose, Bld: 123 mg/dL — ABNORMAL HIGH (ref 70–99)
Phosphorus: 6.5 mg/dL — ABNORMAL HIGH (ref 2.5–4.6)
Potassium: 3.9 mmol/L (ref 3.5–5.1)
Sodium: 135 mmol/L (ref 135–145)

## 2024-03-06 LAB — CBC
HCT: 29.4 % — ABNORMAL LOW (ref 36.0–46.0)
Hemoglobin: 9.8 g/dL — ABNORMAL LOW (ref 12.0–15.0)
MCH: 31.9 pg (ref 26.0–34.0)
MCHC: 33.3 g/dL (ref 30.0–36.0)
MCV: 95.8 fL (ref 80.0–100.0)
Platelets: 267 10*3/uL (ref 150–400)
RBC: 3.07 MIL/uL — ABNORMAL LOW (ref 3.87–5.11)
RDW: 14.7 % (ref 11.5–15.5)
WBC: 6.9 10*3/uL (ref 4.0–10.5)
nRBC: 0 % (ref 0.0–0.2)

## 2024-03-06 LAB — MAGNESIUM: Magnesium: 2.6 mg/dL — ABNORMAL HIGH (ref 1.7–2.4)

## 2024-03-06 MED ORDER — VITAL 1.5 CAL PO LIQD
1000.0000 mL | ORAL | Status: DC
  Administered 2024-03-06 – 2024-03-10 (×5): 1000 mL
  Filled 2024-03-06 (×3): qty 1000

## 2024-03-06 MED ORDER — LACOSAMIDE 50 MG PO TABS
100.0000 mg | ORAL_TABLET | Freq: Two times a day (BID) | ORAL | Status: DC
  Administered 2024-03-06 – 2024-03-13 (×12): 100 mg
  Filled 2024-03-06 (×13): qty 2

## 2024-03-06 NOTE — Progress Notes (Signed)
 LTM maint complete - no skin breakdown.seen.  P3 serviced [facility] monitored, Event button test confirmed by [facility].

## 2024-03-06 NOTE — Procedures (Addendum)
 Patient Name: [name] [name]  MRN: [id number]  Epilepsy Attending: Arlin MALVA Krebs  Referring Physician/Provider: Khaliqdina, Salman, MD  Duration: 03/06/2024 0517 to 03/06/2024  1349   Patient history:  43 y.o. female with history of hypertension, ESRD on HD, s/p renal and pancreatic transplant, diabetes mellitus type 1 with gastroparesis presents with nausea and vomiting since after dialysis. Admitted initially for sepsis felt to be secondary to colitis. Found to have perimesencephalic SAH with IVH suspected to be either hypertensive in etiology vs secondary to peripontine venous anomaly. This was complicated by hydropcehalus s/p EVD placement. Imaging also demonstrated a small left cerbellar infarct, presumed secondary to vasospasm. Neurology reconsulted overnight for possible seizures. EEG to evaluate for seizure.    Level of alertness: Awake, asleep   AEDs during EEG study: LEV   Technical aspects: This EEG study was done with scalp electrodes positioned according to the 10-20 International system of electrode placement. Electrical activity was reviewed with band pass filter of 1-70Hz , sensitivity of 7 uV/mm, display speed of 37mm/sec with a 60Hz  notched filter applied as appropriate. EEG data were recorded continuously and digitally stored.  Video monitoring was available and reviewed as appropriate.   Description: During awake state, no clear posterior dominant rhythm was seen.  Sleep was characterized by vertex, sleep symptoms (12 to 14 Hz), maximal frontocentral region. EEG showed continuous generalized polymorphic sharply contoured 3 to 6 Hz theta-delta slowing, appeared more sharply contoured when awake/stimulated. Sharp waves were also noted in left centro- parietal region. Hyperventilation and photic stimulation were not performed.     ABNORMALITY - Sharp waves, left centro- parietal region - Continuous slow, generalized   IMPRESSION: This study showed evidence of epileptogenicity  arising from left centro- parietal region. Additionally there is moderate to severe diffuse encephalopathy. No seizures were seen throughout the recording.   Annlouise Gerety O Tiphanie Vo

## 2024-03-06 NOTE — Progress Notes (Signed)
 LTM maint complete - no skin breakdown under A1 noted, impedance under 10 Kohms

## 2024-03-06 NOTE — Progress Notes (Signed)
 Physical Therapy Treatment Patient Details Name: [name] [name] MRN: [id number] DOB: 08/01/1981 Today's Date: 03/06/2024   History of Present Illness 43 yo female who presented to the ED 6/11 with n/v, abd pain after HD. 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 CRRT 6/13- 6/15. Cerebral angiogram 6/25 shows no intracranial aneurysms, AVM, or fistula seen to explain the patient's subarachnoid hemorrhage, diffuse mild-moderate anterior circulation vasospasm. EVD 6/12-6/30.  on 7/1, MRI shows new small left parietal cva, cortrak placed postpyloric, vagal episodes with N/V. 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    PT Comments  Pt lethargic upon PT and OT arrival, wakes with repeated and constant verbal stimulation. Pt requiring significant physical assist today, total +2 for transfer-level mobility, and makes no truncal initiation in sitting to right balance. Suspect pt is limited by lethargy, but also is globally weak. Pt spent most of session OOB in recliner to attempt to engage pt in ADL tasks, unsuccessful. PT to update goals, plan remains appropriate.      If plan is discharge home, recommend the following: Assistance with cooking/housework;Assistance with feeding;Direct supervision/assist for medications management;Direct supervision/assist for financial management;Assist for transportation;Help with stairs or ramp for entrance;Supervision due to cognitive status;Two people to help with walking and/or transfers;Two people to help with bathing/dressing/bathroom   Can travel by Doctor, hospital cushion (measurements PT);Wheelchair (measurements PT);Hospital bed;Hoyer lift    Recommendations for Other Services       Precautions / Restrictions Precautions Precautions: Fall Recall of Precautions/Restrictions:  Impaired Precaution/Restrictions Comments: cortrak, EVD d/c 6/30 but reaches for site often, RIJ, flexiseal Restrictions Weight Bearing Restrictions Per Provider Order: No     Mobility  Bed Mobility Overal bed mobility: Needs Assistance Bed Mobility: Rolling, Sidelying to Sit, Sit to Sidelying Rolling: Total assist Sidelying to sit: +2 for physical assistance, Total assist     Sit to sidelying: +2 for physical assistance, Total assist General bed mobility comments: spontaneous roll towards L, otherwise pt requiring total +2 assist for trunk and LE management    Transfers Overall transfer level: Needs assistance Equipment used: 2 person hand held assist Transfers: Bed to chair/wheelchair/BSC       Squat pivot transfers: Total assist, +2 physical assistance     General transfer comment: total +2 all aspects to pivot to/from recliner, placed pt in front of mirror in bathroom with pt with limited awareness and attention to reflection    Ambulation/Gait               General Gait Details: nt   Stairs             Wheelchair Mobility     Tilt Bed    Modified Rankin (Stroke Patients Only) Modified Rankin (Stroke Patients Only) Pre-Morbid Rankin Score: No symptoms Modified Rankin: Severe disability     Balance Overall balance assessment: Needs assistance Sitting-balance support: Feet supported Sitting balance-Leahy Scale: Zero       Standing balance-Leahy Scale: Zero                              Communication Communication Communication: Impaired Factors Affecting Communication: Reduced clarity of speech  Cognition Arousal: Lethargic Behavior During Therapy: Flat affect   PT - Cognitive impairments: Attention, Awareness, Problem solving, Safety/Judgement  PT - Cognition Comments: poor awareness, made more difficult by visual deficits and lethargy. pt moaning in pain when up, and difficult to understand  and makes repeated statements that aren't able to be discerned Following commands: Impaired Following commands impaired: Follows one step commands inconsistently    Cueing Cueing Techniques: Verbal cues, Gestural cues, Tactile cues, Visual cues  Exercises      General Comments General comments (skin integrity, edema, etc.): VSS, pt with co HA      Pertinent Vitals/Pain Pain Assessment Pain Assessment: Faces Faces Pain Scale: Hurts little more Pain Location: head (states yes to headache) Pain Descriptors / Indicators: Moaning, Discomfort Pain Intervention(s): Limited activity within patient's tolerance, Monitored during session, Repositioned    Home Living                          Prior Function            PT Goals (current goals can now be found in the care plan section) Acute Rehab PT Goals PT Goal Formulation: With patient Time For Goal Achievement: 03/20/24 Potential to Achieve Goals: Good Progress towards PT goals: Not progressing toward goals - comment (limited progress, total +2 assist today)    Frequency    Min 3X/week      PT Plan      Co-evaluation   Reason for Co-Treatment: For patient/therapist safety;To address functional/ADL transfers;Necessary to address cognition/behavior during functional activity PT goals addressed during session: Balance;Mobility/safety with mobility OT goals addressed during session: ADL's and self-care      AM-PAC PT 6 Clicks Mobility   Outcome Measure  Help needed turning from your back to your side while in a flat bed without using bedrails?: Total Help needed moving from lying on your back to sitting on the side of a flat bed without using bedrails?: Total Help needed moving to and from a bed to a chair (including a wheelchair)?: Total Help needed standing up from a chair using your arms (e.g., wheelchair or bedside chair)?: Total Help needed to walk in hospital room?: Total Help needed climbing 3-5  steps with a railing? : Total 6 Click Score: 6    End of Session   Activity Tolerance: Patient limited by fatigue;Patient limited by lethargy Patient left: in bed;with call bell/phone within reach;with bed alarm set Nurse Communication: Mobility status;Other (comment) (linen change as pt soiled in stool) PT Visit Diagnosis: Muscle weakness (generalized) (M62.81);Other symptoms and signs involving the nervous system (R29.898) Hemiplegia - Right/Left: Left Hemiplegia - dominant/non-dominant: Non-dominant Hemiplegia - caused by: Other Nontraumatic intracranial hemorrhage     Time: 8652-8581 PT Time Calculation (min) (ACUTE ONLY): 31 min  Charges:    $Therapeutic Activity: 8-22 mins PT General Charges $$ ACUTE PT VISIT: 1 Visit                     Johana RAMAN, PT DPT Acute Rehabilitation Services Secure Chat Preferred  Office [phone]    Myles Tavella E Johna 03/06/2024, 3:07 PM

## 2024-03-06 NOTE — Progress Notes (Signed)
 NAME:  [name] [name], MRN:  [id number], DOB:  July 25, 1981, LOS: 21 ADMISSION DATE:  02/13/2024, CONSULTATION DATE:  02/14/2024 REFERRING MD:  Claudene, CHIEF COMPLAINT:  AMS    History of Present Illness:  43 year old woman with ESRD on HD, HTN, failed renal txp,  pancreatic txp on mycophenolate  and pred 15, DM1 w gastroparesis who presented to ED 6/11 with n/v, abd pain. Hypertensive in ED SBP 240s. Went for CT a/p and was c/f some colitis. Started on abx, given IVF. C/o pain and was very agitated, rcvd pain meds. Admitted 6/12. Over the course of the day she became less responsive. Remained hypertensive w SBPs >200. Incr O2 req to HHFNC. Pt was to rcv HD 6/12 afternoon but when eval by HD NP, concerns for airway protection. Rcvd narcan  with only modest improvement. PCCM consulted in this setting.  Pertinent Medical History:  Failed renal / pancreas txp ESRD  HTN DM1 gastroparesis   Significant Hospital Events: Including procedures, antibiotic start and stop dates in addition to other pertinent events   6/11 ED w n/v AMS. Abx IVF for colitis 6/12 Admit. AMS decr LOC. Incr O2 req. PCCM consult and code stroke called seen w rapid response nephro NP then neuro  6/13 Intubated with EVD, responding to commands  6/14 Extubated 6/17 acute L cerebellar infarct. Keppra  dc  6/18 Dialysis, fevering, EVD at 10 (129 mL last 24 hrs) 6/19 Narcotic dependence a driving factor. +Vasospasm on TCD. CTA with evolving Left cerebellar infarct, no HT or mass effect, substantially decreased caliber of multiple intracranial arteries and bilateral [place] branches (especially: Distal left V4, supraclinoid ICAs, MCA and ACA origins), appearance c/w widespread Vasospasm, Vasoconstriction. No associated large vessel occlusion. 6/21 CSF sampline from EVD -- WBC 244 RBC 19000, cx ngtd  6/25: diagnostic angio today with nsgy - negative 6/26 evd leaking when clamped  6/30 evd removed, HD overnight, HTN improved,  7 days cefazolin  complete 7/1 more lethargic> minimal response to narcan  x2, oxy stopped, EEG neg, MRI - new small left parietal cva, cortrak placed postpyloric, vagal episodes with N/V 7/2 iHD, vimpat started  Interim History / Subjective:  One coughing vs ?vomiting episode this am without bradycardia, not TF appearing per RN No further vomiting or bradycardic episodes Pt more talkative today, oriented to self, c/o of pain everywhere moaning  Objective:  Blood pressure (!) 156/75, pulse 98, temperature 98.3 F (36.8 C), temperature source Axillary, resp. rate 19, height 5' 7.5 (1.715 m), weight 53.5 kg, last menstrual period 02/13/2024, SpO2 100%.        Intake/Output Summary (Last 24 hours) at 03/06/2024 0842 Last data filed at 03/06/2024 0700 Gross per 24 hour  Intake 742.67 ml  Output 3220 ml  Net -2477.33 ml   Filed Weights   03/05/24 0800 03/05/24 1152 03/06/24 0418  Weight: 56.5 kg 54.5 kg 53.5 kg   Physical Examination: General:  ill appearing adult female lying in bed in NAD HEENT: MM pink/moist, cortrak, discongjugate gaze, pupils 3/sluggish, anicteric, LTM in place Neuro: eyes open, moans frequently, oriented to person, more interactive, MAE spont, follows commands at times intermittently CV: rr, NSR/ ST, no murmur, RUE AVF w/ dressing  PULM:  non labored, clear anteriorly, RA at 100% GI: soft, bs+, NT, FMS Extremities: warm/dry, no LE edema  Skin: no rashes  Afebrile Stool output> 1.2L/ 24hrs Labs reviewed   Resolved problem List:  Hypertensive emergency  Acute respiratory failure w hypoxia Aspiration PNA (completed rx 6/19)  Assessment and Plan:  ICH with brain compression Subarachnoid hemorrhage Communicating hydrocephalus s/p EVD removed 6/30 Left cerebellar infarct, 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  SCSE New small left parietal CVA, 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  Hypertension - completed 7 days ancef  7/1 - per Neurology/ NSGY> ok for transfer out of ICU  today  - repeat CTH 7/7 - LTM being discontinued, AEDS per Neurology> vimpat - seizure precautions - serial neuros - SBP goal < 160 -  prn hydralazine  - nimodipine  per NSGY - serial neuro exams/ neuroprotective measures - PT/ OT/ SLP    End-stage renal disease on hemodialysis Chronic immunosuppression with CellCept , cyclosporine , prednisone  - History of failed transplant - per Nephrology - last iHD 7/2 - trend renal indices - cont cellcept , cyclosporine , prednisone     Diarrhea related to colitis -  loperamide  prn  - also changing TF from Nephro to vital per RD to help    Bradycardia- suspected vagal response, always associated with vomiting episodes > resolved - Continue reglan  - cortrak post pyloric 7/1 - tele monitoring - aspiration precautions   S/p pancreatic transplant Diabetes - Continue CBG q4, SSI prn  - suppression meds as above   Anemia of chronic illness - stable, trend CBC  At risk for malnutrition - TF per EN/ RD. Changing from nephro to vital to see if better tolerated   Best Practice (right click and Reselect all SmartList Selections daily)   Diet/type: tubefeeds  DVT prophylaxis prophylactic heparin   and ASA started 6/20 GI prophylaxis: N/A Lines: N/A  Foley:  N/A Code Status:  full code Last date of multidisciplinary goals of care discussion [Friend updated 6/12. Patient[place].]  Pending update 7/3  Will transfer to PCU, 4NP preferably and transfer to [facility] for primary as of 7/4.    Lyle Pesa, MSN, AG-ACNP-BC [facility] 03/06/2024, 8:42 AM  See Amion for pager If no response to pager , please call [phone] until 7pm After 7:00 pm call Elink  [phone]

## 2024-03-06 NOTE — Progress Notes (Signed)
 STROKE TEAM PROGRESS NOTE    SIGNIFICANT HOSPITAL EVENTS 6/11- ED for nausea, vomiting after dialysis 6/12- intubated, EVD placed  6/14 - extubated 6/17 - Acute left cerebellar infarct 6/19 - vasospasm seen on TCD 6/21 - CSF sampling from EVD with WBC 244 and RBC 19000 6/25 - angiogram with diffuse mild-moderate anterior circulation vasospasm  INTERIM HISTORY/SUBJECTIVE Patient is much more awake and interactive today.  Following commands.  Long-term EEG showed left frontotemporal sharp waves.  Patient was started on Vimpat yesterday. OBJECTIVE  CBC    Component Value Date/Time   WBC 6.9 03/06/2024 0508   RBC 3.07 (L) 03/06/2024 0508   HGB 9.8 (L) 03/06/2024 0508   HCT 29.4 (L) 03/06/2024 0508   PLT 267 03/06/2024 0508   MCV 95.8 03/06/2024 0508   MCH 31.9 03/06/2024 0508   MCHC 33.3 03/06/2024 0508   RDW 14.7 03/06/2024 0508   LYMPHSABS 1.2 02/20/2024 1125   MONOABS 0.7 02/20/2024 1125   EOSABS 0.0 02/20/2024 1125   BASOSABS 0.0 02/20/2024 1125    BMET    Component Value Date/Time   NA 135 03/06/2024 0508   K 3.9 03/06/2024 0508   CL 90 (L) 03/06/2024 0508   CO2 25 03/06/2024 0508   GLUCOSE 123 (H) 03/06/2024 0508   BUN 25 (H) 03/06/2024 0508   CREATININE 3.93 (H) 03/06/2024 0508   CALCIUM  10.8 (H) 03/06/2024 0508   GFRNONAA 14 (L) 03/06/2024 0508    IMAGING past 24 hours No results found.      Vitals:   03/06/24 1030 03/06/24 1100 03/06/24 1201 03/06/24 1300  BP: (!) 155/64 137/60 131/73 (!) 140/66  Pulse: 96 98 96 95  Resp: (!) 21 (!) 22 20 13   Temp:   98.6 F (37 C)   TempSrc:   Oral   SpO2: 100% 100% 100% 100%  Weight:      Height:         PHYSICAL EXAM General: Alert, ill-appearing patient, NAD CV: ST, 110s Respiratory: room air, unlabored  NEURO:  Patient is alert and moves all 4 extremities to command she will make sounds, response to her name and answers questions with I do not know eyes are disconjugate with bilateral 6th nerve  palsy noted, left eye does not close completely, left facial droop noted, EVD is in place.  She does move all 4 extremities against gravity but moves right side more than left.  ASSESSMENT/PLAN  Ms. Charnese Naseer Axton is a 43 y.o. female with history of hypertension, ESRD on HD, s/p renal and pancreatic transplant, diabetes mellitus type 1 with gastroparesis presents with nausea and vomiting since after dialysis.  NIH on Admission 26  Perimesencephalic SAH with IVH - hypertensive vs. Prepontine venous anomaly not seen on angiogram New complaints of jaw clenching and increased somnolence raising concerns for seizures.  EEG shows only moderate diffuse encephalopathy Code Stroke CT head - Hemorrhage involving the prepontine cistern extending into the upper cervical spinal canal. Intraventricular blood products layering in the occipital horns.  CTA head & neck - No large vessel occlusion. No high-grade stenosis, aneurysm, or dissection of the arteries in the head and neck. Mild stenosis of the V4 segments of both vertebral arteries. Mild stenosis of the right supraclinoid ICA.  6/12 repeat CT Head- Interval placement of a right frontal approach ventriculostomy with tip in the body of the right lateral ventricle. Persistent but relatively stable hydrocephalus at this time. Moderate volume acute subarachnoid hemorrhage with associated IVH, similar to prior.  No evidence for significant interval bleeding since prior. MRI  Small acute anterior corpus callosum infarct Mildly improved hydrocephalus with EVD in place Decreased perimesencephalic subarachnoid hemorrhage and IVH Trace supratentorial and infratentorial subdural hemorrhage Age advanced chronic small vessel ischemic disease with chronic lacunar infarcts Repeat CT 6/18: Stable multicompartment acute hemorrhage.  No progressive mass effect or new acute hemorrhage.  Stable ventricular size with right frontal approach. EEG- negative for seizures or  epileptiform discharges 2D Echo EF 60-65% Cerebral angiogram showed relatively diffuse mild-moderate anterior circulation vasospasm  LDL 63 HgbA1c 4.2 VTE prophylaxis - heparin  suq No antithrombotic prior to admission, now on No antithrombotic  Therapy recommendations:  CIR Disposition:  Pending   Vasospasm with left cerebellar infarct CTA head and neck 6/12 - mild irregularity of the BA without significant narrowing, concerning for mild vasospasm MRI 6/15 Small acute anterior corpus callosum infarct CTA head and neck 6/19 substantially decreased caliber of multiple intracranial arteries and bilateral circle-of-Willis branches (especially: Distal left V4, supraclinoid ICAs, MCA and ACA origins) when compared to 02/14/2024. The appearance most resembles a widespread Vasospasm, Vasoconstriction.  TCD monitoring  6/16: No definite evidence of vasospasm noted in identified blood vessels.  6/18: elevated mean flow velocities in right middle cerebral artery suggestive of moderate and left middle cerebral artery suggestive of mild vasospasm.  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  NSGY consulted Keppra  discontinued 6/17 EVD placement 6/12, continued per NS Plan to elevate EVD and perhaps remove over the next few days   Fever in the setting of EVD and immunosuppressant use Fever since 6/15, Tmax 101.7-102.2-102.9-101.7-103.1-102.5-101.2-100-99.3 WBC 9.4--8.8--7.5--5.9 On cyclosporine , cellcept  and prednisone  CSF sampling from EVD with WBC 244 and RBC 19000, culture NGTD On meropenem  and vanco -> ancef   Respiratory failure  Aspiration pneumonia PCCM  primary Intubated 6/12, extubated 6/14 Continued on Zosyn  for aspiration and colitis, 8-day course--completed CXR 6/19: Improving pneumonia  Hypertensive Emergency  Now with Mild Induced Hypertension Home meds:  Norvasc , coreg , clonidine  patch, hydralazine   Stable on the high end Off Cleviprex , labetalol  PRN Now off levophed  (6/21) BP goal 140-160 Continue nimodipine   Long term BP goal normotensive  Diabetes Type 1 Hx of pancreas transplant  Chronically on CellCept , cyclosporine  and prednisone  HgbA1c 4.2, goal < 7.0 CBGs, SSI  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 Narcotics dependence Chronic pain Per pt she was using large dose of narcotics at home Resume home oxycodone  5mg  Q12h->Q8h -> discontinued 7/1 due to lethargy Narcan  on 7/1 with some improvement  Fioricet  PRN Monitoring pain level  Other medical issues DM gastroparesis  Colitis, completed course of Zosyn  Thrombocytopenia, improved - platelet [phone] Additional Labs sent, B12, Folte, RPR, TSH, Ammonia--WNL  Hospital day # 21   Stroke team reconsulted for concerns for seizures due to increased lethargy with episodes of bradycardia and jaw clenching however EEG shows only moderate diffuse slowing however MRI scan shows remote age blood products in the left parietal region  with areas of laminar necrosis and diffusion positivity of the posterior left parietal cortex gyriform pattern which is not concerning for seizures and recommend starting Vimpat 100 twice daily instead of Keppra  due to concerns about QT prolongation.  Long-term EEG does show left frontal temporal sharp waves.  Recommend continue  long-term anticonvulsants.  Recommend transfer out of ICU to floor bed as per critical care team.  Stroke team will sign off.  Kindly call for questions.  No family available at the bedside.  Discussed with Dr. Volanda critical care medicine Greater than 50% time during this 50-minute visit was spent in counseling and coordination of care about seizures and abnormal MRI scan discussion with patient and care team and answering questions.    Eather Popp, MD Medical Director [facility] Pager: [phone] 03/06/2024 3:26 PM

## 2024-03-06 NOTE — Progress Notes (Signed)
 Occupational Therapy Treatment Patient Details Name: [name] [name] MRN: [id number] DOB: 17-Jan-1981 Today's Date: 03/06/2024   History of present illness 43 yo female who presented to the ED 6/11 with n/v, abd pain after HD. 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 CRRT 6/13- 6/15. Cerebral angiogram 6/25 shows no intracranial aneurysms, AVM, or fistula seen to explain the patient's subarachnoid hemorrhage, diffuse mild-moderate anterior circulation vasospasm. EVD 6/12-6/30.  on 7/1, MRI shows new small left parietal cva, cortrak placed postpyloric, vagal episodes with N/V. 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   OT comments  Pt is not making progress towards her acute OT goals, goals downgraded in care plan. Session completed with PT to safely address OOB. Overall she required total A +2 for all aspects of mobility with very little muscle activation/initiation/functional participation noted despite maximal efforts by PT/OT. Pt was brought to sink in recliner to facilitate grooming, pt required hand over hand with very minimal attempt 1x to hold wash cloth. Pt noted to have a new? R 3rd trigger finger. Occlusion glasses donned for session, minimal improvement noted as pt has very poor visual attention and tracking. OT to continue to follow acutely to facilitate progress towards established goals. Pt will continue to benefit from skilled inpatient follow up therapy, <3 hours/day.       If plan is discharge home, recommend the following:  A lot of help with walking and/or transfers;Two people to help with walking and/or transfers;A lot of help with bathing/dressing/bathroom;Two people to help with bathing/dressing/bathroom;Assistance with cooking/housework;Assistance with feeding;Direct supervision/assist for medications management;Direct supervision/assist for financial management;Assist  for transportation;Supervision due to cognitive status;Help with stairs or ramp for entrance   Equipment Recommendations  Other (comment)       Precautions / Restrictions Precautions Precautions: Fall Recall of Precautions/Restrictions: Impaired Precaution/Restrictions Comments: cortrak, EVD d/c 6/30 but reaches for site often, RIJ, flexiseal Restrictions Weight Bearing Restrictions Per Provider Order: No       Mobility Bed Mobility Overal bed mobility: Needs Assistance Bed Mobility: Supine to Sit, Sit to Supine, Rolling Rolling: Total assist, +2 for physical assistance, +2 for safety/equipment   Supine to sit: Total assist, +2 for physical assistance, +2 for safety/equipment Sit to supine: Total assist, +2 for physical assistance, +2 for safety/equipment   General bed mobility comments: pt did spontaneously roll to her L 2x with minimal assist, otherwise required total A to roll to command or during functional tasks    Transfers Overall transfer level: Needs assistance Equipment used: 2 person hand held assist Transfers: Bed to chair/wheelchair/BSC     Squat pivot transfers: Total assist, +2 physical assistance, +2 safety/equipment       General transfer comment: very minimal muscle activation during transfer process     Balance Overall balance assessment: Needs assistance Sitting-balance support: Feet supported Sitting balance-Leahy Scale: Zero       Standing balance-Leahy Scale: Zero                             ADL either performed or assessed with clinical judgement   ADL Overall ADL's : Needs assistance/impaired     Grooming: Total assistance   Upper Body Bathing: Total assistance;Sitting               Toilet Transfer: Total assistance;+2 for physical assistance;+2 for safety/equipment   Toileting- Clothing Manipulation and Hygiene: Total assistance Toileting -  Clothing Manipulation Details (indicate cue type and reason): pericare at  bed level     Functional mobility during ADLs: Total assistance;+2 for physical assistance;+2 for safety/equipment General ADL Comments: very limited arousal, no turnk control, no initiation of any task despite maximal multimodal cues    Extremity/Trunk Assessment Upper Extremity Assessment Upper Extremity Assessment: RUE deficits/detail;LUE deficits/detail RUE Deficits / Details: getting weaker, no real attempt at functional use during bed mobility, transfers or grooming. Seemingly has a new trigger finger contracture at 3rd digit. LUE Deficits / Details: continues to get weaker. No real functional use of LUE throughout. Pt did make minimal effort to hold a wash cloth, otherwise required HOH LUE Coordination: decreased fine motor;decreased gross motor   Lower Extremity Assessment Lower Extremity Assessment: Defer to PT evaluation        Vision   Vision Assessment?: Vision impaired- to be further tested in functional context Eye Alignment: Impaired (comment) Ocular Range of Motion: Impaired-to be further tested in functional context Tracking/Visual Pursuits: Decreased smoothness of horizontal tracking;Decreased smoothness of vertical tracking Saccades: Decreased speed of saccadic movement;Additional eye shifts occurred during testing;Impaired - to be further tested in functional context Convergence: Impaired (comment) Visual Fields: Impaired-to be further tested in functional context Additional Comments: occlusion glasses donned for session, dificult to know if glassess help or not due to lethargy and impaired cognition. Poor visual attention   Perception Perception Perception: Impaired   Praxis Praxis Praxis: Impaired   Communication Communication Communication: Impaired Factors Affecting Communication: Reduced clarity of speech   Cognition Arousal: Lethargic Behavior During Therapy: Flat affect Cognition: Cognition impaired, Difficult to assess Difficult to assess due to:  Level of arousal, Impaired communication Orientation impairments: Place, Time, Situation Awareness: Intellectual awareness impaired, Online awareness impaired Memory impairment (select all impairments): Short-term memory, Working [employer] fast streamer, Conservation officer, historic buildings, Non-declarative long-term memory Attention impairment (select first level of impairment): Focused attention Executive functioning impairment (select all impairments): Initiation, Organization, Sequencing, Reasoning, Problem solving OT - Cognition Comments: No initiation of any functional task, no command following, extremely limited visual attention, pt perseverating on unintelligible words and phrases throughout                 Following commands: Impaired Following commands impaired: Follows one step commands inconsistently      Cueing   Cueing Techniques: Verbal cues, Gestural cues, Tactile cues, Visual cues        General Comments VSS, pt with co HA    Pertinent Vitals/ Pain       Pain Assessment Pain Assessment: Faces Faces Pain Scale: Hurts little more Pain Location: headache? Pain Descriptors / Indicators: Grimacing, Guarding Pain Intervention(s): Limited activity within patient's tolerance, Monitored during session   Frequency  Min 2X/week        Progress Toward Goals  OT Goals(current goals can now be found in the care plan section)  Progress towards OT goals: Not progressing toward goals - comment  Acute Rehab OT Goals Patient Stated Goal: to lay down OT Goal Formulation: Patient unable to participate in goal setting Time For Goal Achievement: 03/19/24 Potential to Achieve Goals: Fair ADL Goals Pt Will Perform Grooming: with max assist;sitting Pt Will Perform Upper Body Dressing: with max assist;sitting Pt Will Transfer to Toilet: with max assist;stand pivot transfer;bedside commode Additional ADL Goal #1: pt will follow 1 step commands 50% of the time to complete a functional task   Plan      Co-evaluation    PT/OT/SLP Co-Evaluation/Treatment: Yes Reason for Co-Treatment:  For patient/therapist safety;To address functional/ADL transfers;Necessary to address cognition/behavior during functional activity PT goals addressed during session: Balance;Mobility/safety with mobility OT goals addressed during session: ADL's and self-care      AM-PAC OT 6 Clicks Daily Activity     Outcome Measure   Help from another person eating meals?: Total Help from another person taking care of personal grooming?: Total Help from another person toileting, which includes using toliet, bedpan, or urinal?: Total Help from another person bathing (including washing, rinsing, drying)?: Total Help from another person to put on and taking off regular upper body clothing?: Total Help from another person to put on and taking off regular lower body clothing?: Total 6 Click Score: 6    End of Session Equipment Utilized During Treatment: Gait belt  OT Visit Diagnosis: Unsteadiness on feet (R26.81);Other abnormalities of gait and mobility (R26.89);Muscle weakness (generalized) (M62.81);Hemiplegia and hemiparesis Hemiplegia - Right/Left: Right   Activity Tolerance Patient limited by lethargy   Patient Left in bed;with call bell/phone within reach   Nurse Communication Mobility status        Time: 8655-8581 OT Time Calculation (min): 34 min  Charges: OT General Charges $OT Visit: 1 Visit OT Treatments $Self Care/Home Management : 8-22 mins  Lucie Kendall, OTR/L Acute Rehabilitation Services Office [phone] Secure Chat Communication Preferred   Lucie JONETTA Kendall 03/06/2024, 3:07 PM

## 2024-03-06 NOTE — Progress Notes (Signed)
 Providing Compassionate, Quality Care - Together   Subjective: Nurse reports patient is stable.  Objective: Vital signs in last 24 hours: Temp:  [97.3 F (36.3 C)-98.5 F (36.9 C)] 98.3 F (36.8 C) (07/03 0800) Pulse Rate:  [86-111] 96 (07/03 0900) Resp:  [9-26] 19 (07/03 0900) BP: (113-196)/(62-101) 174/96 (07/03 0900) SpO2:  [96 %-100 %] 100 % (07/03 0900) Weight:  [53.5 kg-54.5 kg] 53.5 kg (07/03 0418)  Intake/Output from previous day: 07/02 0701 - 07/03 0700 In: 742.7 [NG/GT:652.7] Out: 3220 [Stool:1220] Intake/Output this shift: No intake/output data recorded.  Alert PERRLA, dysconjugate gaze Speech dysarthric MAE, not presently following commands  Lab Results: Recent Labs    03/05/24 0459 03/06/24 0508  WBC 4.3 6.9  HGB 8.8* 9.8*  HCT 27.1* 29.4*  PLT 227 267   BMET Recent Labs    03/05/24 0459 03/06/24 0508  NA 134* 135  K 4.3 3.9  CL 88* 90*  CO2 21* 25  GLUCOSE 89 123*  BUN 37* 25*  CREATININE 4.82* 3.93*  CALCIUM  10.8* 10.8*    Studies/Results: VAS US  TRANSCRANIAL DOPPLER Result Date: 03/05/2024  Transcranial Doppler Patient Name:  CAMREIGH MICHIE  Date of Exam:   03/05/2024 Medical Rec #: [id number]           Accession #:    [id number] Date of Birth: 1981-04-17            Patient Gender: F Patient Age:   43 years Exam Location:  [facility] Procedure:      VAS US  TRANSCRANIAL DOPPLER Referring Phys: ARY XU --------------------------------------------------------------------------------  Indications: Subarachnoid hemorrhage. History: Right frontal ventriculostomy on 02/14/2024. Performing Technologist: Ezzie Potters RVT, RDMS  Examination Guidelines: A complete evaluation includes B-mode imaging, spectral Doppler, color Doppler, and power Doppler as needed of all accessible portions of each vessel. Bilateral testing is considered an integral part of a complete examination. Limited examinations for reoccurring indications may be performed as  noted.  +----------+---------------+----------+-----------+------------------+ RIGHT TCD Right VM (cm/s)Depth (cm)Pulsatility     Comment       +----------+---------------+----------+-----------+------------------+ MCA             75          4.90      1.45       PSV 148 cm/s    +----------+---------------+----------+-----------+------------------+ ACA                                           unable to insonate +----------+---------------+----------+-----------+------------------+ Term ICA        39                    1.45                       +----------+---------------+----------+-----------+------------------+ PCA P1          34                    1.69                       +----------+---------------+----------+-----------+------------------+ Opthalmic       17                    1.99                       +----------+---------------+----------+-----------+------------------+  ICA siphon                                    unable to insonate +----------+---------------+----------+-----------+------------------+ Vertebral       -15                   1.27                       +----------+---------------+----------+-----------+------------------+ Distal ICA      38                    1.40                       +----------+---------------+----------+-----------+------------------+  +----------+--------------+----------+-----------+------------------+ LEFT TCD  Left VM (cm/s)Depth (cm)Pulsatility     Comment       +----------+--------------+----------+-----------+------------------+ MCA             48                   1.42                       +----------+--------------+----------+-----------+------------------+ ACA            -19                   1.76                       +----------+--------------+----------+-----------+------------------+ Term ICA        27                   1.67                        +----------+--------------+----------+-----------+------------------+ PCA P1          24                   1.36                       +----------+--------------+----------+-----------+------------------+ Opthalmic       23                   2.04                       +----------+--------------+----------+-----------+------------------+ ICA siphon                                   unable to insonate +----------+--------------+----------+-----------+------------------+ Vertebral      -37                   1.75                       +----------+--------------+----------+-----------+------------------+ Distal ICA     -30                   1.47                       +----------+--------------+----------+-----------+------------------+  +------------+-------+-------+             VM cm/sComment +------------+-------+-------+ Prox Basilar  -24   1.75   +------------+-------+-------+ Dist Basilar  -21  1.81   +------------+-------+-------+ +----------------------+----+ Right Lindegaard Ratio1.97 +----------------------+----+ +---------------------+---+ Left Lindegaard Ratio1.6 +---------------------+---+  Summary:  Elevated PSV velocity seen in MCA (148 cm/s), however significantly lower than previous exams. Lindegaard ratio and MCA mean velocity within normal range, bilaterally. *See table(s) above for TCD measurements and observations.    Preliminary    Overnight EEG with video Result Date: 03/05/2024 Shelton Arlin KIDD, MD     03/05/2024  8:45 AM Patient Name: [name] [name] MRN: [id number] Epilepsy Attending: Arlin KIDD Shelton Referring Physician/Provider: Khaliqdina, Salman, MD Duration: 03/05/2024 9482 to 0845 Patient history:  43 y.o. female with history of hypertension, ESRD on HD, s/p renal and pancreatic transplant, diabetes mellitus type 1 with gastroparesis presents with nausea and vomiting since after dialysis. Admitted initially for sepsis felt to be secondary to  colitis. Found to have perimesencephalic SAH with IVH suspected to be either hypertensive in etiology vs secondary to peripontine venous anomaly. This was complicated by hydropcehalus s/p EVD placement. Imaging also demonstrated a small left cerbellar infarct, presumed secondary to vasospasm. Neurology reconsulted overnight for possible seizures. EEG to evaluate for seizure. Level of alertness:  comatose/ lethargic AEDs during EEG study: None Technical aspects: This EEG study was done with scalp electrodes positioned according to the 10-20 International system of electrode placement. Electrical activity was reviewed with band pass filter of 1-70Hz , sensitivity of 7 uV/mm, display speed of 39mm/sec with a 60Hz  notched filter applied as appropriate. EEG data were recorded continuously and digitally stored.  Video monitoring was available and reviewed as appropriate. Description: EEG showed continuous generalized polymorphic sharply contoured 3 to 6 Hz theta-delta slowing. Hyperventilation and photic stimulation were not performed.   ABNORMALITY - Continuous slow, generalized IMPRESSION: This study is suggestive of moderate to severe diffuse encephalopathy. No seizures or epileptiform discharges were seen throughout the recording. Arlin KIDD Shelton   MR BRAIN WO CONTRAST Result Date: 03/05/2024 CLINICAL DATA:  Mental status change, unknown cause EXAM: MRI HEAD WITHOUT CONTRAST TECHNIQUE: Multiplanar, multiecho pulse sequences of the brain and surrounding structures were obtained without intravenous contrast. COMPARISON:  MRI head February 17, 2024. CT head from earlier today. FINDINGS: Brain: The area of hypodensity seen on recent CT head correlates with peripheral cortical restricted diffusion and edema, compatible with acute or early subacute infarct. Susceptibility artifact and T2 hypointensity in the right temporal horn is consistent with pneumo ventricle and similar cross modalities to recent CT head. Right frontal  approach EVD in place with similar size of the ventricles which remain mildly enlarged. Layering signal abnormality within the lateral ventricles is consistent with intraventricular hemorrhage which is also similar across modalities. Similar small focus of encephalomalacia in the cerebellum. Small remote lacunar infarcts in the thalami. Scattered T2/FLAIR hyperintensities in the white matter, compatible with chronic microvascular ischemic disease. Vascular: Normal flow voids. Skull and upper cervical spine: Normal marrow signal. Sinuses/Orbits: Mild paranasal sinus mucosal thickening. No acute orbital findings. IMPRESSION: 1. Left parietal acute or early subacute infarct with associated edema. No substantial mass effect. 2. Right frontal approach EVD with similar pneumoventricle, mild ventriculomegaly, and small volume intraventricular hemorrhage. Scattered small volume of subarachnoid hemorrhage better characterized on recent CT head. 3. Small remote cerebellar and bilateral thalamic lacunar infarcts and chronic microvascular ischemic disease. Electronically Signed   By: Gilmore GORMAN Molt M.D.   On: 03/05/2024 03:05   DG Abd 1 View Result Date: 03/04/2024 CLINICAL DATA:  Encounter for feeding tube placement. EXAM: ABDOMEN - 1 VIEW COMPARISON:  None Available. FINDINGS: Feeding  tube placement was performed by radiology technologist. No radiologist was involved in this exam. Four fluoroscopic spot images submitted. A 10 French Cortrak tube was placed, tip appears to be post pyloric within the distal duodenum. 20 cc of Omnipaque  300 was injected through the tube. Fluoroscopy time 2 minutes 54 seconds. Dose 13.5 mGy. IMPRESSION: Feeding tube placement with tip post pyloric in the proximal duodenum. Electronically Signed   By: Andrea Gasman M.D.   On: 03/04/2024 15:59   CT HEAD WO CONTRAST ( ) Result Date: 03/04/2024 CLINICAL DATA:  43 year old female dialysis patient with acute intracranial hemorrhage on  02/14/2024. Subarachnoid and subdural blood. External ventricular drain removed. EXAM: CT HEAD WITHOUT CONTRAST TECHNIQUE: Contiguous axial images were obtained from the base of the skull through the vertex without intravenous contrast. RADIATION DOSE REDUCTION: This exam was performed according to the departmental dose-optimization program which includes automated exposure control, adjustment of the mA and/or kV according to patient size and/or use of iterative reconstruction technique. COMPARISON:  Head CT 02/20/2024 and earlier. FINDINGS: Brain: Right temporal horn pneumo ventricle. That temporal horn mildly dilated now, but otherwise no significant change in lateral ventricle size. Fourth ventricle appears larger on sagittal image 30. Third ventricle also slightly larger on coronal image 34. No transependymal edema. Trace residual layering IVH in the occipital horns. Gas also along the EVD tract. No midline shift. Basilar cisterns are patent. Trace residual subarachnoid blood. No convincing subdural hematoma. Heterogeneous gray and white matter in the left parietal lobe in an area of 2 cm on sagittal image 39 is new, resembles mild cerebral edema but there is no regional mass effect. Gray-white differentiation elsewhere is maintained. Small area of chronic encephalomalacia in the left cerebellar tonsil is stable. Vascular: Calcified atherosclerosis at the skull base. No suspicious intracranial vascular hyperdensity. Skull: Right superior frontal burr hole.  Otherwise intact. Sinuses/Orbits: Left nasoenteric tube in place. Visualized paranasal sinuses and mastoids are stable and well aerated. Other: Postoperative changes to the right scalp vertex. No new orbit or scalp soft tissue finding. Calcified scalp vessel atherosclerosis. IMPRESSION: 1. Right frontal approach EVD removed with mild pneumocephalus and right temporal horn pneumo ventricle. The right temporal horn, lateral and 3rd ventricles are mildly larger  since 02/20/2024. Trace residual SAH, IVH and no transependymal edema. 2. New 2 cm area of heterogeneous hypodensity in the left parietal lobe resembling cerebral edema. No regional or intracranial mass effect. 3. Stable gray-white differentiation elsewhere, including mild chronic encephalomalacia in the left inferior cerebellum. Electronically Signed   By: VEAR Hurst M.D.   On: 03/04/2024 11:35    Assessment/Plan: Patient intraventricular hemorrhage, hydrocephalus, cerebral infarct, left parietal infarct. 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 EVD removed 03/03/2024.   LOS: 21 days   -Will repeat CT head on 03/10/2024 -Patient is stable from a Neurosurgical standpoint to move to a Progressive unit.  I am in communication with my attending and they agree with the plan for this patient.   Gerard Beck, DNP, AGNP-C Nurse Practitioner  [facility] [address], [place], [name] [address] P: [phone]    F: [phone]  03/06/2024, 9:52 AM

## 2024-03-06 NOTE — TOC Progression Note (Signed)
 Transition of Care [facility]) - Progression Note    Patient Details  Name: [name] [name] MRN: [id number] Date of Birth: 23-Jun-1981  Transition of Care [facility]) CM/SW Contact  Inocente GORMAN Kindle, LCSW Phone Number: 03/06/2024, 4:48 PM  Clinical Narrative:    CSW received text from patient's friend, Nori, with info for the [employer]. Information is unclear so CSW requested she obtain additional info. CSW emailed parents, [email or web address]; [email or web address] for clarification prior to letter being signed.    Expected Discharge Plan: Skilled Nursing Facility Barriers to Discharge: Continued Medical Work up, English as a second language teacher, SNF Pending bed offer  Expected Discharge Plan and Services                                               Social Determinants of Health (SDOH) Interventions SDOH Screenings   Food Insecurity: Patient Unable To Answer (02/15/2024)  Housing: Low Risk  (03/05/2024)  Transportation Needs: Patient Unable To Answer (02/15/2024)  Utilities: Patient Unable To Answer (02/15/2024)  Social Connections: Unknown (10/04/2022)   Received from [facility]  Tobacco Use: Medium Risk (02/13/2024)    Readmission Risk Interventions    11/17/2021   12:15 PM  Readmission Risk Prevention Plan  Transportation Screening Complete  Medication Review (RN Care Manager) Complete  PCP or Specialist appointment within 3-5 days of discharge Complete  HRI or Home Care Consult Complete  SW Recovery Care/Counseling Consult Complete  Palliative Care Screening Not Applicable  Skilled Nursing Facility Patient Refused

## 2024-03-06 NOTE — Progress Notes (Signed)
 Nutrition Follow-up  DOCUMENTATION CODES:   Non-severe (moderate) malnutrition in context of chronic illness  INTERVENTION:   Continue tube feeding via post pyloric Cortrak: Transition from Nepro to Vital 1.5 for better tolerance  Vital 1.5 @ 30 ml and increase by 10 ml every 8 hours until goal of 50 ml/hr and monitor tolerance    Provides 1800 kcal, 81 gm protein, 917 ml free water  daily  *If pt experiences nausea or vomiting, hold tube feds.    Continue RenaVit daily  NUTRITION DIAGNOSIS:   Moderate Malnutrition related to chronic illness (ESRD on HD/DM) as evidenced by mild fat depletion, mild muscle depletion. 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  6/11 - ED for abx for colitis 6/12 - admit for ICH; intubated with EVD; started CRRT 6/13 - s/p cortrak placement; tip gastric 6/14 - extubated  6/15 - CRRT stopped 6/16 - began iHD treatment regimen MWF 6/27- SLP therapy, allowing floor snacks of thin liquids/purees but remained NPO 6/28- recorded emesis episode, tube feeds stopped 6/30 -EVD removed. HD overnight  7/1 - s/p post pyloric cortrak placement, New CVA  7/2 - Trickle tube feeds resumed, HD AM  7/3 - Switched to VITAL 1.5, titrate tube feeds to goal    Pt more awake and alert today, complained of overall pain. Per RN one vomiting episode without bradycardia, did not appear like tube feeds. Having increased stool output, NP added Imodium  yesterday which has helped. Will transition to Vital 1.5 for better tolerance, discussed with CCM NP.   SLP recommending to  continue NPO but to continue allowing floor stock thin liquids and puree/pudding solids   Admit weight: 59.3 kg  Current weight: 53.5 kg    Intake/Output Summary (Last 24 hours) at 03/06/2024 1208 Last data filed at 03/06/2024 1043 Gross per 24 hour  Intake 823.5 ml  Output 1220 ml  Net -396.5 ml   Net IO Since Admission: -2,188.55 mL [03/06/24 1208]  Drains/Lines: FMS No UOP  Nutritionally Relevant Medications: Scheduled Meds:  insulin  aspart  0-9 Units Subcutaneous Q4H   lacosamide  100 mg Per Tube BID   metoCLOPramide  (REGLAN ) injection  5 mg Intravenous Q8H   midodrine   5 mg Per Tube Q8H   multivitamin  1 tablet Per Tube QHS   mycophenolate   500 mg Per Tube BID   niMODipine   60 mg Per Tube Q4H   mouth rinse  15 mL Mouth Rinse 4 times per day   predniSONE   15 mg Per Tube Q breakfast   sevelamer  carbonate  2.4 g Per Tube TID WC   Continuous Infusions:  anticoagulant sodium citrate      feeding supplement (VITAL 1.5 CAL)      Labs Reviewed: BUN 25 Creatinine 3.93 Phosphorus 6.5 Magnesium  2.6 GFR 14 CBG ranges from 84-158 mg/dL over the last 24 hours HgbA1c 4.2  Diet Order:   Diet Order             Diet NPO time specified  Diet effective now  EDUCATION NEEDS:   No education needs have been identified at this time  Skin:  Skin Assessment: Reviewed RN Assessment  Last BM:  7/3 1220 ML VIA fms X 24 hours  Height:   Ht Readings from Last 1 Encounters:  02/14/24 5' 7.5 (1.715 m)    Weight:   Wt Readings from Last 1 Encounters:  03/06/24 53.5 kg    Ideal Body Weight:  61.4 kg  BMI:  Body mass index is 18.2 kg/m.  Estimated Nutritional Needs:   Kcal:  1800-2000  Protein:  75-90 grams  Fluid:  1L + UOP   Olivia Kenning, RD Registered Dietitian  See Amion for more information

## 2024-03-06 NOTE — Progress Notes (Signed)
 [facility] Progress Note   Subjective:   Tolerated dialysis yesterday.  Continues to be somewhat lethargic.  Waxing and waning mental status.   Objective Vitals:   03/06/24 0617 03/06/24 0700 03/06/24 0800 03/06/24 0900  BP: (!) 143/85 (!) 158/73 (!) 156/75 (!) 174/96  Pulse: 89 88 98 96  Resp: 14 16 19 19   Temp:   98.3 F (36.8 C)   TempSrc:   Axillary   SpO2: 100% 100% 100% 100%  Weight:      Height:       Physical Exam General: Lying in bed, no distress Heart: Normal rate, murmur present, no rub Lungs: bilateral chest rise, no increased work of breathing Abdomen: soft, nontender Extremities: No lower extremity edema, warm and well-perfused  Dialysis Access:  RUE AVF +t/b  OP HD:  MWF AF 3h  B400   57.2kg  2K   AVF  Heparin  none - Hectoral 4mcg IV q HD - Mircera 150mcg IV q 2 weeks (last given 6/9 -> due 6/23)   Assessment/Plan: Intracerebral hemorrhage: C/b hydrocephalus, 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 TCDs with evidence of vasospasm 02/11/24. Followed closely by neurosurgery, evd removed 6/30 CVA: New CVA found on 7/1.  Management per primary team BP/volume: bp's stable, continue with ultrafiltration as tolerated ESRD: MWF HD. S/p CRRT 6/12-6/12, now back to iHD. NO heparin .  Continue dialysis per schedule Anemia of ESRD: Hgb 8-10 here. Started darbe at 60 micrograms sq weekly. Iron  adequate Secondary HPTH:Ca and Phos high, VDRA stopped 6/21, added sevelamer  via tube. PTH 115.  Could consider low-dose Sensipar .  Continue to monitor for now because I think hypercalcemia likely a minimal problem at this point Pancreas/ kidney transplant (2014): for the pancreas, we resumed her usual dosing of the CyA at 225mg  qam and 200 mg qpm. She had been underdosed here for about 2 wks.    03/06/2024, 9:30 AM  Recent Labs  Lab 02/29/24 0557 03/01/24 0627 03/05/24 0459 03/06/24 0508  HGB  --    < > 8.8* 9.8*  ALBUMIN 2.7*  --   --  3.0*   CALCIUM  11.5*   < > 10.8* 10.8*  PHOS 9.2*   < > 8.6* 6.5*  CREATININE 5.58*   < > 4.82* 3.93*  K 4.5   < > 4.3 3.9   < > = values in this interval not displayed.    Inpatient medications:  acetaminophen   650 mg Per Tube Q4H   Or   acetaminophen   650 mg Rectal Q4H   aspirin   81 mg Per Tube Daily   Chlorhexidine  Gluconate Cloth  6 each Topical Q0600   cycloSPORINE   200 mg Per Tube QHS   cycloSPORINE   225 mg Per Tube Daily   darbepoetin (ARANESP ) injection - DIALYSIS  60 mcg Subcutaneous Q Fri-1800   heparin  injection (subcutaneous)  5,000 Units Subcutaneous Q8H   insulin  aspart  0-9 Units Subcutaneous Q4H   lacosamide  100 mg Oral BID   metoCLOPramide  (REGLAN ) injection  5 mg Intravenous Q8H   midodrine   5 mg Per Tube Q8H   multivitamin  1 tablet Per Tube QHS   mycophenolate   500 mg Per Tube BID   niMODipine   60 mg Per Tube Q4H   mouth rinse  15 mL Mouth Rinse 4 times per day   predniSONE   15 mg Per Tube Q breakfast   sevelamer  carbonate  2.4 g Per Tube TID WC   sodium bicarbonate   1,300 mg Per Tube  TID   sodium chloride  flush  3 mL Intravenous Q12H   sodium chloride  flush  3-10 mL Intravenous Q12H    anticoagulant sodium citrate      feeding supplement (NEPRO CARB STEADY) 20 mL/hr at 03/06/24 0700   albuterol , alteplase , anticoagulant sodium citrate , artificial tears, atropine, butalbital -acetaminophen -caffeine , feeding supplement (NEPRO CARB STEADY), Gerhardt's butt cream, heparin , hydrALAZINE , iohexol , labetalol , lidocaine  (PF), lidocaine -prilocaine , loperamide  HCl, naLOXone  (NARCAN )  injection, ondansetron  (ZOFRAN ) IV, mouth rinse, pentafluoroprop-tetrafluoroeth, simethicone         \

## 2024-03-06 NOTE — Progress Notes (Signed)
 LTM VIDEO EEG discontinued - no skin breakdown at [facility]. Equipment issues with NATUS 8.

## 2024-03-07 DIAGNOSIS — I609 Nontraumatic subarachnoid hemorrhage, unspecified: Secondary | ICD-10-CM | POA: Diagnosis not present

## 2024-03-07 DIAGNOSIS — N186 End stage renal disease: Secondary | ICD-10-CM | POA: Diagnosis not present

## 2024-03-07 DIAGNOSIS — Z992 Dependence on renal dialysis: Secondary | ICD-10-CM | POA: Diagnosis not present

## 2024-03-07 DIAGNOSIS — E440 Moderate protein-calorie malnutrition: Secondary | ICD-10-CM | POA: Diagnosis not present

## 2024-03-07 LAB — GLUCOSE, CAPILLARY
Glucose-Capillary: 126 mg/dL — ABNORMAL HIGH (ref 70–99)
Glucose-Capillary: 136 mg/dL — ABNORMAL HIGH (ref 70–99)
Glucose-Capillary: 137 mg/dL — ABNORMAL HIGH (ref 70–99)
Glucose-Capillary: 143 mg/dL — ABNORMAL HIGH (ref 70–99)
Glucose-Capillary: 156 mg/dL — ABNORMAL HIGH (ref 70–99)
Glucose-Capillary: 170 mg/dL — ABNORMAL HIGH (ref 70–99)

## 2024-03-07 MED ORDER — MORPHINE SULFATE (PF) 2 MG/ML IV SOLN
1.0000 mg | INTRAVENOUS | Status: DC | PRN
  Administered 2024-03-07 – 2024-03-10 (×9): 1 mg via INTRAVENOUS
  Filled 2024-03-07 (×10): qty 1

## 2024-03-07 NOTE — Progress Notes (Signed)
 SLP Cancellation Note  Patient Details Name: [name] [name] MRN: [id number] DOB: March 29, 1981   Cancelled treatment:       Reason Eval/Treat Not Completed: Patient at procedure or test/unavailable. Touched base with RN - pt getting dialysis. Will f/u on subsequent date.     Leita SAILOR., M.A. CCC-SLP Acute Rehabilitation Services Office: [phone]  Secure chat preferred  03/07/2024, 2:12 PM

## 2024-03-07 NOTE — Progress Notes (Signed)
 Patient ID: [name] [name], female   DOB: [birth year], 43 y.o.   MRN: [id number] Receiving hemodialysis.  Patient's neurologic status remains unchanged.  Continue supportive care.

## 2024-03-07 NOTE — Progress Notes (Signed)
 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 Hypertensive in ED SBP 240s.  CT scan suggested colitis.  Patient's mentation continued to worsen.CT head was subsequently done which revealed intracranial hemorrhage.  Patient was admitted to the ICU.    Consultants: Critical care medicine.  Neurosurgery.  Neurology.  Nephrology  Significant Hospital Events:  6/11 ED w n/v AMS. Abx IVF for colitis 6/12 Admit. AMS decr LOC. Incr O2 req. PCCM consult and code stroke called seen w rapid response nephro NP then neuro  6/13 Intubated with EVD, responding to commands  6/14 Extubated 6/17 acute L cerebellar infarct. Keppra  dc  6/18 Dialysis, fevering, EVD at 10 (129 mL last 24 hrs) 6/19 Narcotic dependence a driving factor. +Vasospasm on TCD. CTA with evolving Left cerebellar infarct, no HT or mass effect, substantially decreased caliber of multiple intracranial arteries and bilateral Circle-of[place] branches (especially: Distal left V4, supraclinoid ICAs, MCA and ACA origins), appearance c/w widespread Vasospasm, Vasoconstriction. No associated large vessel occlusion. 6/21 CSF sampline from EVD -- WBC 244 RBC 19000, cx ngtd  6/25: diagnostic angio today with nsgy - negative 6/26 evd leaking when clamped  6/30 evd removed, HD overnight, HTN improved, 7 days cefazolin  complete 7/1 more lethargic> minimal response to narcan  x2, oxy stopped, EEG neg, MRI - new small left parietal cva, cortrak placed postpyloric, vagal episodes with N/V 7/2 iHD, vimpat  started    Subjective/Interval History: Patient noted to be lethargic but opens her eyes when her name is called.  Follows certain commands.  Does not answer any  questions.    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  Underwent placement of right frontal pressure ventriculostomy via bur hole. Management per neurology and neurosurgery. Ventriculostomy was removed on 6/30. Further management per neurosurgery.  Concern for seizure activity Seen by neurology.  Underwent EEG.  Patient was started on Vimpat .  Improvement noted.  Vasospasm with left cerebellar infarct Seen by neurology.  Noted to be on aspirin . LDL 63.  HbA1c 4.2. Patient noted to be on midodrine  for BP augmentation. PT and OT following.  SNF being pursued.  End-stage renal disease on hemodialysis History of failed transplant is present.  She is chronically immunosuppressed with CellCept  cyclosporine  and prednisone . Management per nephrology. Noted to be on sodium bicarbonate   Bradycardia Secondary to vagal response. Resolved.  Oropharyngeal dysphagia Noted to have core track feeding tube.  Dietitian following.  Speech therapy has also been following.  Colitis Stable.  Status post pancreatic transplant/diabetes Monitor CBGs.  Anemia of chronic illness Monitor hemoglobin.  Moderate protein calorie malnutrition Nutrition Problem: Moderate Malnutrition Etiology: chronic illness (ESRD on HD/DM)  DVT Prophylaxis: Subcutaneous heparin  Code Status: Full code Family Communication: No family at bedside Disposition Plan: SNF when medically improved     Medications: Scheduled:  acetaminophen   650 mg Per Tube Q4H   Or   acetaminophen   650 mg Rectal Q4H   aspirin   81 mg Per Tube Daily   Chlorhexidine  Gluconate Cloth  6 each Topical Q0600   cycloSPORINE   200 mg Per Tube QHS   cycloSPORINE   225 mg Per Tube Daily   darbepoetin (ARANESP ) injection - DIALYSIS  60 mcg Subcutaneous Q Fri-1800  heparin  injection (subcutaneous)  5,000 Units Subcutaneous  Q8H   insulin  aspart  0-9 Units Subcutaneous Q4H   lacosamide   100 mg Per Tube BID   metoCLOPramide  (REGLAN ) injection  5 mg Intravenous Q8H   midodrine   5 mg Per Tube Q8H   multivitamin  1 tablet Per Tube QHS   mycophenolate   500 mg Per Tube BID   mouth rinse  15 mL Mouth Rinse 4 times per day   predniSONE   15 mg Per Tube Q breakfast   sevelamer  carbonate  2.4 g Per Tube TID WC   sodium bicarbonate   1,300 mg Per Tube TID   sodium chloride  flush  3 mL Intravenous Q12H   sodium chloride  flush  3-10 mL Intravenous Q12H   Continuous:  anticoagulant sodium citrate      feeding supplement (VITAL 1.5 CAL) 50 mL/hr at 03/06/24 2000   PRN:albuterol , alteplase , anticoagulant sodium citrate , artificial tears, atropine , butalbital -acetaminophen -caffeine , feeding supplement (NEPRO CARB STEADY), Gerhardt's butt cream, heparin , hydrALAZINE , iohexol , labetalol , lidocaine  (PF), lidocaine -prilocaine , loperamide  HCl, naLOXone  (NARCAN )  injection, ondansetron  (ZOFRAN ) IV, mouth rinse, pentafluoroprop-tetrafluoroeth, simethicone   Antibiotics: Anti-infectives (From admission, onward)    Start     Dose/Rate Route Frequency Ordered Stop   02/27/24 1200  vancomycin  (VANCOREADY) IVPB 500 mg/100 mL  Status:  Discontinued        500 mg 100 mL/hr over 60 Minutes Intravenous Every M-W-F (Hemodialysis) 02/25/24 1129 02/25/24 1436   02/26/24 2200  ceFAZolin  (ANCEF ) IVPB 1 g/50 mL premix        1 g 100 mL/hr over 30 Minutes Intravenous Every 24 hours 02/26/24 1009 03/03/24 2140   02/25/24 1530  vancomycin  (VANCOREADY) IVPB 500 mg/100 mL        500 mg 100 mL/hr over 60 Minutes Intravenous Every M-W-F (Hemodialysis) 02/25/24 1436 02/25/24 1934   02/23/24 1800  meropenem  (MERREM ) 1 g in sodium chloride  0.9 % 100 mL IVPB  Status:  Discontinued        1 g 200 mL/hr over 30 Minutes Intravenous Every 24 hours 02/23/24 1625 02/26/24 1009   02/23/24 1800  vancomycin  (VANCOREADY) IVPB 1250 mg/250 mL        1,250 mg 166.7  mL/hr over 90 Minutes Intravenous  Once 02/23/24 1625 02/23/24 1933   02/17/24 2200  piperacillin -tazobactam (ZOSYN ) IVPB 2.25 g        2.25 g 100 mL/hr over 30 Minutes Intravenous Every 8 hours 02/17/24 1412 02/21/24 2336   02/14/24 1800  vancomycin  (VANCOREADY) IVPB 2000 mg/400 mL        2,000 mg 200 mL/hr over 120 Minutes Intravenous  Once 02/14/24 1709 02/14/24 2048   02/14/24 1622  piperacillin -tazobactam (ZOSYN ) IVPB 3.375 g  Status:  Discontinued        3.375 g 100 mL/hr over 30 Minutes Intravenous Every 6 hours 02/14/24 1622 02/17/24 1412   02/14/24 1545  piperacillin -tazobactam (ZOSYN ) IVPB 3.375 g  Status:  Discontinued        3.375 g 12.5 mL/hr over 240 Minutes Intravenous Every 8 hours 02/14/24 1451 02/14/24 1453   02/14/24 1545  piperacillin -tazobactam (ZOSYN ) IVPB 2.25 g  Status:  Discontinued        2.25 g 100 mL/hr over 30 Minutes Intravenous Every 8 hours 02/14/24 1453 02/14/24 1622   02/14/24 0800  cefTRIAXone  (ROCEPHIN ) 2 g in sodium chloride  0.9 % 100 mL IVPB  Status:  Discontinued        2 g 200 mL/hr over 30 Minutes Intravenous Every 24 hours 02/14/24 0730  02/14/24 1451   02/14/24 0800  metroNIDAZOLE  (FLAGYL ) IVPB 500 mg  Status:  Discontinued        500 mg 100 mL/hr over 60 Minutes Intravenous Every 12 hours 02/14/24 0730 02/14/24 1451       Objective:  Vital Signs  Vitals:   03/07/24 0400 03/07/24 0433 03/07/24 0748 03/07/24 0800  BP: (!) 163/78 (!) 147/72 (!) 161/76 (!) 143/68  Pulse: (!) 105 89 94 92  Resp: 17 20 12 14   Temp:   98.7 F (37.1 C)   TempSrc:   Axillary   SpO2: 100% 100% 100% 100%  Weight:      Height:        Intake/Output Summary (Last 24 hours) at 03/07/2024 1109 Last data filed at 03/07/2024 0800 Gross per 24 hour  Intake 424.33 ml  Output 640 ml  Net -215.67 ml   Filed Weights   03/05/24 0800 03/05/24 1152 03/06/24 0418  Weight: 56.5 kg 54.5 kg 53.5 kg    General appearance: Logic but arousable.  Does not answer  questions. Resp: Normal effort at rest.  Diminished air entry at the bases.  No wheezing or rhonchi. Cardio: S1-S2 is normal regular.  No S3-S4.  No rubs murmurs or bruit GI: Abdomen is soft.  Nontender nondistended.  Bowel sounds are present normal.  No masses organomegaly Extremities: Mittens noted over hands.  Does not voluntarily move her extremities.  Noted to be moving her lower extremities.    Lab Results:  Data Reviewed: I have personally reviewed following labs and reports of the imaging studies  CBC: Recent Labs  Lab 03/02/24 0721 03/03/24 0706 03/04/24 0606 03/05/24 0459 03/06/24 0508  WBC 3.8* 3.9* 4.3 4.3 6.9  HGB 7.9* 8.3* 8.5* 8.8* 9.8*  HCT 24.2* 25.6* 25.2* 27.1* 29.4*  MCV 94.5 96.6 93.3 94.8 95.8  PLT 194 185 207 227 267    Basic Metabolic Panel: Recent Labs  Lab 03/01/24 0627 03/02/24 0721 03/03/24 0706 03/04/24 0606 03/05/24 0459 03/06/24 0508  NA 137 137 134*  --  134* 135  K 3.6 4.4 4.7  --  4.3 3.9  CL 95* 91* 86*  --  88* 90*  CO2 25 22 17*  --  21* 25  GLUCOSE 113* 90 96  --  89 123*  BUN 24* 45* 57*  --  37* 25*  CREATININE 2.49* 4.66* 6.10*  --  4.82* 3.93*  CALCIUM  9.7 10.5* 11.3*  --  10.8* 10.8*  MG 2.4  --   --  2.5* 2.7* 2.6*  PHOS  --   --   --  4.7* 8.6* 6.5*    GFR: Estimated Creatinine Clearance: 15.7 mL/min (A) (by C-G formula based on SCr of 3.93 mg/dL (H)).  Liver Function Tests: Recent Labs  Lab 03/06/24 0508  ALBUMIN 3.0*    Recent Labs  Lab 03/03/24 1535  LIPASE 26   Recent Labs  Lab 03/05/24 0459  AMMONIA 18     CBG: Recent Labs  Lab 03/06/24 1952 03/06/24 2038 03/06/24 2353 03/07/24 0402 03/07/24 0746  GLUCAP 127* 125* 133* 143* 136*     Radiology Studies: VAS US  TRANSCRANIAL DOPPLER Result Date: 03/06/2024  Transcranial Doppler Patient Name:  [name] [name]  Date of Exam:   03/05/2024 Medical Rec #: [id number]           Accession #:    [id number] Date of Birth: 1981-07-24            Patient  Gender: F  Patient Age:   62 years Exam Location:  [facility] Procedure:      VAS US  TRANSCRANIAL DOPPLER Referring Phys: ARY XU --------------------------------------------------------------------------------  Indications: Subarachnoid hemorrhage. History: Right frontal ventriculostomy on 02/14/2024. Performing Technologist: Ezzie Potters RVT, RDMS  Examination Guidelines: A complete evaluation includes B-mode imaging, spectral Doppler, color Doppler, and power Doppler as needed of all accessible portions of each vessel. Bilateral testing is considered an integral part of a complete examination. Limited examinations for reoccurring indications may be performed as noted.  +----------+---------------+----------+-----------+------------------+ RIGHT TCD Right VM (cm/s)Depth (cm)Pulsatility     Comment       +----------+---------------+----------+-----------+------------------+ MCA             75          4.90      1.45       PSV 148 cm/s    +----------+---------------+----------+-----------+------------------+ ACA                                           unable to insonate +----------+---------------+----------+-----------+------------------+ Term ICA        39                    1.45                       +----------+---------------+----------+-----------+------------------+ PCA P1          34                    1.69                       +----------+---------------+----------+-----------+------------------+ Opthalmic       17                    1.99                       +----------+---------------+----------+-----------+------------------+ ICA siphon                                    unable to insonate +----------+---------------+----------+-----------+------------------+ Vertebral       -15                   1.27                       +----------+---------------+----------+-----------+------------------+ Distal ICA      38                    1.40                        +----------+---------------+----------+-----------+------------------+  +----------+--------------+----------+-----------+------------------+ LEFT TCD  Left VM (cm/s)Depth (cm)Pulsatility     Comment       +----------+--------------+----------+-----------+------------------+ MCA             48                   1.42                       +----------+--------------+----------+-----------+------------------+ ACA            -19  1.76                       +----------+--------------+----------+-----------+------------------+ Term ICA        27                   1.67                       +----------+--------------+----------+-----------+------------------+ PCA P1          24                   1.36                       +----------+--------------+----------+-----------+------------------+ Opthalmic       23                   2.04                       +----------+--------------+----------+-----------+------------------+ ICA siphon                                   unable to insonate +----------+--------------+----------+-----------+------------------+ Vertebral      -37                   1.75                       +----------+--------------+----------+-----------+------------------+ Distal ICA     -30                   1.47                       +----------+--------------+----------+-----------+------------------+  +------------+-------+-------+             VM cm/sComment +------------+-------+-------+ Prox Basilar  -24   1.75   +------------+-------+-------+ Dist Basilar  -21   1.81   +------------+-------+-------+ +----------------------+----+ Right Lindegaard Ratio1.97 +----------------------+----+ +---------------------+---+ Left Lindegaard Ratio1.6 +---------------------+---+  Summary:   Normal mean flow velocities in identified vessels of anterior and posterior cerebral circulations. *See table(s) above for TCD  measurements and observations.  Diagnosing physician: Eather Popp MD Electronically signed by Eather Popp MD on 03/06/2024 at 1:59:00 PM.    Final        LOS: 22 days   Joette Pebbles  [name] Pager on [email or web address]  03/07/2024, 11:09 AM

## 2024-03-07 NOTE — Progress Notes (Signed)
 The patient's AVF cannulated with 16 g needles. The blood return from the venous needle was dark in color. A total of 2.5 liters of fluid were removed. The patient was moaning incessantly.  03/07/24 1536  Vitals  Temp 97.6 F (36.4 C)  Temp Source Axillary  BP 120/71  BP Location Left Arm  BP Method Automatic  Patient Position (if appropriate) Lying  Pulse Rate (!) 104  Weight 54.5 kg  Type of Weight Post-Dialysis  Oxygen  Therapy  SpO2 100 %  O2 Device Room Air  During Treatment Monitoring  Intra-Hemodialysis Comments Tx completed  Post Treatment  Dialyzer Clearance Heavily streaked  Hemodialysis Intake (mL) 0 mL  Liters Processed 64  Fluid Removed (mL) 2500 mL  Tolerated HD Treatment Yes  Post-Hemodialysis Comments goal met.  AVG/AVF Arterial Site Held (minutes) 15 minutes  AVG/AVF Venous Site Held (minutes) 10 minutes  Fistula / Graft Right Upper arm Arteriovenous fistula  Placement Date/Time: (c) 12/08/20 1403   Placed prior to admission: Yes  Orientation: Right  Access Location: Upper arm  Access Type: Arteriovenous fistula  Site Condition No complications  Fistula / Graft Assessment Present;Thrill;Bruit  Status Deaccessed  Needle Size 16  Drainage Description None

## 2024-03-07 NOTE — Progress Notes (Signed)
 HD nurse at bedside setting up for dialysis

## 2024-03-07 NOTE — Progress Notes (Signed)
 [facility] Progress Note   Subjective:   Patient tired and not interactive today.  Objective Vitals:   03/07/24 0400 03/07/24 0433 03/07/24 0748 03/07/24 0800  BP: (!) 163/78 (!) 147/72 (!) 161/76 (!) 143/68  Pulse: (!) 105 89 94 92  Resp: 17 20 12 14   Temp:   98.7 F (37.1 C)   TempSrc:   Axillary   SpO2: 100% 100% 100% 100%  Weight:      Height:       Physical Exam General: Lying in bed, no distress Heart: Normal rate, murmur present, no rub Lungs: bilateral chest rise, no increased work of breathing Abdomen: soft, nontender Extremities: No lower extremity edema, warm and well-perfused  Dialysis Access:  RUE AVF +t/b  OP HD:  MWF AF 3h  B400   57.2kg  2K   AVF  Heparin  none - Hectoral 4mcg IV q HD - Mircera 150mcg IV q 2 weeks (last given 6/9 -> due 6/23)   Assessment/Plan: Intracerebral hemorrhage: C/b hydrocephalus, 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 TCDs with evidence of vasospasm 02/11/24. Followed closely by neurosurgery, evd removed 6/30 CVA: New CVA found on 7/1.  Management per primary team BP/volume: bp's stable, continue with ultrafiltration as tolerated ESRD: MWF HD. S/p CRRT 6/12-6/12, now back to iHD. NO heparin .  Continue dialysis per schedule Anemia of ESRD: Hgb 8-10 here. Started darbe at 60 micrograms sq weekly. Iron  adequate Secondary HPTH:Ca and Phos high, VDRA stopped 6/21, added sevelamer  via tube. PTH 115.  Could consider low-dose Sensipar .  Continue to monitor for now because I think hypercalcemia likely a minimal problem at this point Pancreas/ kidney transplant (2014): for the pancreas, we resumed her usual dosing of the CyA at 225mg  qam and 200 mg qpm. She had been underdosed here for about 2 wks.    03/07/2024, 9:29 AM  Recent Labs  Lab 03/05/24 0459 03/06/24 0508  HGB 8.8* 9.8*  ALBUMIN  --  3.0*  CALCIUM  10.8* 10.8*  PHOS 8.6* 6.5*  CREATININE 4.82* 3.93*  K 4.3 3.9    Inpatient medications:   acetaminophen   650 mg Per Tube Q4H   Or   acetaminophen   650 mg Rectal Q4H   aspirin   81 mg Per Tube Daily   Chlorhexidine  Gluconate Cloth  6 each Topical Q0600   cycloSPORINE   200 mg Per Tube QHS   cycloSPORINE   225 mg Per Tube Daily   darbepoetin (ARANESP ) injection - DIALYSIS  60 mcg Subcutaneous Q Fri-1800   heparin  injection (subcutaneous)  5,000 Units Subcutaneous Q8H   insulin  aspart  0-9 Units Subcutaneous Q4H   lacosamide   100 mg Per Tube BID   metoCLOPramide  (REGLAN ) injection  5 mg Intravenous Q8H   midodrine   5 mg Per Tube Q8H   multivitamin  1 tablet Per Tube QHS   mycophenolate   500 mg Per Tube BID   mouth rinse  15 mL Mouth Rinse 4 times per day   predniSONE   15 mg Per Tube Q breakfast   sevelamer  carbonate  2.4 g Per Tube TID WC   sodium bicarbonate   1,300 mg Per Tube TID   sodium chloride  flush  3 mL Intravenous Q12H   sodium chloride  flush  3-10 mL Intravenous Q12H    anticoagulant sodium citrate      feeding supplement (VITAL 1.5 CAL) 50 mL/hr at 03/06/24 2000   albuterol , alteplase , anticoagulant sodium citrate , artificial tears, atropine , butalbital -acetaminophen -caffeine , feeding supplement (NEPRO CARB STEADY), Gerhardt's butt cream, heparin , hydrALAZINE , iohexol ,  labetalol , lidocaine  (PF), lidocaine -prilocaine , loperamide  HCl, naLOXone  (NARCAN )  injection, ondansetron  (ZOFRAN ) IV, mouth rinse, pentafluoroprop-tetrafluoroeth, simethicone         \

## 2024-03-08 DIAGNOSIS — Z992 Dependence on renal dialysis: Secondary | ICD-10-CM | POA: Diagnosis not present

## 2024-03-08 DIAGNOSIS — I609 Nontraumatic subarachnoid hemorrhage, unspecified: Secondary | ICD-10-CM | POA: Diagnosis not present

## 2024-03-08 DIAGNOSIS — E440 Moderate protein-calorie malnutrition: Secondary | ICD-10-CM | POA: Diagnosis not present

## 2024-03-08 DIAGNOSIS — N186 End stage renal disease: Secondary | ICD-10-CM | POA: Diagnosis not present

## 2024-03-08 LAB — GLUCOSE, CAPILLARY
Glucose-Capillary: 119 mg/dL — ABNORMAL HIGH (ref 70–99)
Glucose-Capillary: 124 mg/dL — ABNORMAL HIGH (ref 70–99)
Glucose-Capillary: 124 mg/dL — ABNORMAL HIGH (ref 70–99)
Glucose-Capillary: 134 mg/dL — ABNORMAL HIGH (ref 70–99)
Glucose-Capillary: 147 mg/dL — ABNORMAL HIGH (ref 70–99)
Glucose-Capillary: 99 mg/dL (ref 70–99)

## 2024-03-08 LAB — COMPREHENSIVE METABOLIC PANEL WITH GFR
ALT: <5 U/L (ref 0–44)
AST: 19 U/L (ref 15–41)
Albumin: 2.9 g/dL — ABNORMAL LOW (ref 3.5–5.0)
Alkaline Phosphatase: 81 U/L (ref 38–126)
Anion gap: 19 — ABNORMAL HIGH (ref 5–15)
BUN: 29 mg/dL — ABNORMAL HIGH (ref 6–20)
CO2: 25 mmol/L (ref 22–32)
Calcium: 10.7 mg/dL — ABNORMAL HIGH (ref 8.9–10.3)
Chloride: 92 mmol/L — ABNORMAL LOW (ref 98–111)
Creatinine, Ser: 4.04 mg/dL — ABNORMAL HIGH (ref 0.44–1.00)
GFR, Estimated: 14 mL/min — ABNORMAL LOW (ref >60)
Glucose, Bld: 113 mg/dL — ABNORMAL HIGH (ref 70–99)
Potassium: 4.8 mmol/L (ref 3.5–5.1)
Sodium: 136 mmol/L (ref 135–145)
Total Bilirubin: 0.2 mg/dL (ref 0.0–1.2)
Total Protein: 6.2 g/dL — ABNORMAL LOW (ref 6.5–8.1)

## 2024-03-08 LAB — CBC
HCT: 28.6 % — ABNORMAL LOW (ref 36.0–46.0)
Hemoglobin: 9.5 g/dL — ABNORMAL LOW (ref 12.0–15.0)
MCH: 32.5 pg (ref 26.0–34.0)
MCHC: 33.2 g/dL (ref 30.0–36.0)
MCV: 97.9 fL (ref 80.0–100.0)
Platelets: 230 K/uL (ref 150–400)
RBC: 2.92 MIL/uL — ABNORMAL LOW (ref 3.87–5.11)
RDW: 15.6 % — ABNORMAL HIGH (ref 11.5–15.5)
WBC: 8.5 K/uL (ref 4.0–10.5)
nRBC: 0 % (ref 0.0–0.2)

## 2024-03-08 MED ORDER — MIDODRINE HCL 5 MG PO TABS
5.0000 mg | ORAL_TABLET | Freq: Two times a day (BID) | ORAL | Status: DC
  Filled 2024-03-08 (×3): qty 1

## 2024-03-08 MED ORDER — CHLORHEXIDINE GLUCONATE CLOTH 2 % EX PADS
6.0000 | MEDICATED_PAD | Freq: Every day | CUTANEOUS | Status: DC
  Administered 2024-03-08: 6 via TOPICAL

## 2024-03-08 NOTE — Progress Notes (Signed)
 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 Hypertensive in ED SBP 240s.  CT scan suggested colitis.  Patient's mentation continued to worsen.CT head was subsequently done which revealed intracranial hemorrhage.  Patient was admitted to the ICU.    Consultants: Critical care medicine.  Neurosurgery.  Neurology.  Nephrology  Significant Hospital Events:  6/11 ED w n/v AMS. Abx IVF for colitis 6/12 Admit. AMS decr LOC. Incr O2 req. PCCM consult and code stroke called seen w rapid response nephro NP then neuro  6/13 Intubated with EVD, responding to commands  6/14 Extubated 6/17 acute L cerebellar infarct. Keppra  dc  6/18 Dialysis, fevering, EVD at 10 (129 mL last 24 hrs) 6/19 Narcotic dependence a driving factor. +Vasospasm on TCD. CTA with evolving Left cerebellar infarct, no HT or mass effect, substantially decreased caliber of multiple intracranial arteries and bilateral [place]-of[place] branches (especially: Distal left V4, supraclinoid ICAs, MCA and ACA origins), appearance c/w widespread Vasospasm, Vasoconstriction. No associated large vessel occlusion. 6/21 CSF sampline from EVD -- WBC 244 RBC 19000, cx ngtd  6/25: diagnostic angio today with nsgy - negative 6/26 evd leaking when clamped  6/30 evd removed, HD overnight, HTN improved, 7 days cefazolin  complete 7/1 more lethargic> minimal response to narcan  x2, oxy stopped, EEG neg, MRI - new small left parietal cva, cortrak placed postpyloric, vagal episodes with N/V 7/2 iHD, vimpat  started    Subjective/Interval History: Patient noted to be a bit more communicative this morning.  Complains of headache.      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  Underwent placement of right frontal pressure ventriculostomy via bur hole. Management per neurology and neurosurgery. Ventriculostomy was removed on 6/30. Further management per neurosurgery. Seems to be stable from neurological standpoint except for headaches.  She is on as needed medications for same.  She is also on Fioricet .  She is on scheduled Tylenol .  LFTs are stable.  Concern for seizure activity Seen by neurology.  Underwent EEG.  Patient was started on Vimpat .  Improvement noted.  Vasospasm with left cerebellar infarct Seen by neurology.  Noted to be on aspirin . LDL 63.  HbA1c 4.2. Patient noted to be on midodrine  for BP augmentation.  Now noted to be hypertensive.  Patient does experience some degree of hypotension when on dialysis.  We can cut back the midodrine  to twice a day and see how she does. PT and OT following.  SNF being pursued.  End-stage renal disease on hemodialysis History of failed transplant is present.  She is chronically immunosuppressed with CellCept  cyclosporine  and prednisone . Management per nephrology. Noted to be on sodium bicarbonate   Bradycardia Secondary to vagal response. Resolved.  Oropharyngeal dysphagia Noted to have core track feeding tube.  Dietitian following.  Speech therapy has also been following.  Mentation seems to be slightly better today.  Hopefully her swallow function will improve.  Colitis Stable.  Status post pancreatic transplant/diabetes Monitor CBGs.  Anemia of chronic illness Monitor hemoglobin.  Moderate protein calorie malnutrition Nutrition Problem: Moderate Malnutrition Etiology: chronic illness (ESRD on HD/DM)  DVT Prophylaxis: Subcutaneous heparin  Code Status: Full code Family Communication: No family at bedside Disposition Plan: SNF when medically improved  Medications: Scheduled:   acetaminophen   650 mg Per Tube Q4H   Or   acetaminophen   650 mg Rectal Q4H   aspirin   81 mg Per Tube Daily   Chlorhexidine  Gluconate Cloth  6 each Topical Q0600   cycloSPORINE   200 mg Per Tube QHS   cycloSPORINE   225 mg Per Tube Daily   darbepoetin (ARANESP ) injection - DIALYSIS  60 mcg Subcutaneous Q Fri-1800   heparin  injection (subcutaneous)  5,000 Units Subcutaneous Q8H   insulin  aspart  0-9 Units Subcutaneous Q4H   lacosamide   100 mg Per Tube BID   metoCLOPramide  (REGLAN ) injection  5 mg Intravenous Q8H   midodrine   5 mg Per Tube Q8H   multivitamin  1 tablet Per Tube QHS   mycophenolate   500 mg Per Tube BID   mouth rinse  15 mL Mouth Rinse 4 times per day   predniSONE   15 mg Per Tube Q breakfast   sevelamer  carbonate  2.4 g Per Tube TID WC   sodium bicarbonate   1,300 mg Per Tube TID   sodium chloride  flush  3 mL Intravenous Q12H   sodium chloride  flush  3-10 mL Intravenous Q12H   Continuous:  anticoagulant sodium citrate      feeding supplement (VITAL 1.5 CAL) Stopped (03/08/24 0848)   PRN:albuterol , alteplase , anticoagulant sodium citrate , artificial tears, atropine , butalbital -acetaminophen -caffeine , feeding supplement (NEPRO CARB STEADY), Gerhardt's butt cream, heparin , hydrALAZINE , iohexol , labetalol , lidocaine  (PF), lidocaine -prilocaine , loperamide  HCl, morphine  injection, naLOXone  (NARCAN )  injection, ondansetron  (ZOFRAN ) IV, mouth rinse, pentafluoroprop-tetrafluoroeth, simethicone   Antibiotics: Anti-infectives (From admission, onward)    Start     Dose/Rate Route Frequency Ordered Stop   02/27/24 1200  vancomycin  (VANCOREADY) IVPB 500 mg/100 mL  Status:  Discontinued        500 mg 100 mL/hr over 60 Minutes Intravenous Every M-W-F (Hemodialysis) 02/25/24 1129 02/25/24 1436   02/26/24 2200  ceFAZolin  (ANCEF ) IVPB 1 g/50 mL premix        1 g 100 mL/hr over 30 Minutes Intravenous Every 24 hours 02/26/24 1009 03/03/24 2140   02/25/24 1530  vancomycin  (VANCOREADY) IVPB 500  mg/100 mL        500 mg 100 mL/hr over 60 Minutes Intravenous Every M-W-F (Hemodialysis) 02/25/24 1436 02/25/24 1934   02/23/24 1800  meropenem  (MERREM ) 1 g in sodium chloride  0.9 % 100 mL IVPB  Status:  Discontinued        1 g 200 mL/hr over 30 Minutes Intravenous Every 24 hours 02/23/24 1625 02/26/24 1009   02/23/24 1800  vancomycin  (VANCOREADY) IVPB 1250 mg/250 mL        1,250 mg 166.7 mL/hr over 90 Minutes Intravenous  Once 02/23/24 1625 02/23/24 1933   02/17/24 2200  piperacillin -tazobactam (ZOSYN ) IVPB 2.25 g        2.25 g 100 mL/hr over 30 Minutes Intravenous Every 8 hours 02/17/24 1412 02/21/24 2336   02/14/24 1800  vancomycin  (VANCOREADY) IVPB 2000 mg/400 mL        2,000 mg 200 mL/hr over 120 Minutes Intravenous  Once 02/14/24 1709 02/14/24 2048   02/14/24 1622  piperacillin -tazobactam (ZOSYN ) IVPB 3.375 g  Status:  Discontinued        3.375 g 100 mL/hr over 30 Minutes Intravenous Every 6 hours 02/14/24 1622 02/17/24 1412   02/14/24 1545  piperacillin -tazobactam (ZOSYN ) IVPB 3.375 g  Status:  Discontinued        3.375 g 12.5 mL/hr over 240 Minutes Intravenous Every 8 hours 02/14/24 1451 02/14/24 1453   02/14/24 1545  piperacillin -tazobactam (ZOSYN ) IVPB 2.25 g  Status:  Discontinued        2.25 g 100 mL/hr over 30 Minutes Intravenous Every 8 hours 02/14/24 1453 02/14/24 1622   02/14/24 0800  cefTRIAXone  (ROCEPHIN ) 2 g in sodium chloride  0.9 % 100 mL IVPB  Status:  Discontinued        2 g 200 mL/hr over 30 Minutes Intravenous Every 24 hours 02/14/24 0730 02/14/24 1451   02/14/24 0800  metroNIDAZOLE  (FLAGYL ) IVPB 500 mg  Status:  Discontinued        500 mg 100 mL/hr over 60 Minutes Intravenous Every 12 hours 02/14/24 0730 02/14/24 1451       Objective:  Vital Signs  Vitals:   03/07/24 2357 03/08/24 0356 03/08/24 0715 03/08/24 0757  BP: (!) 140/74   (!) 158/70  Pulse:    (!) 101  Resp:    17  Temp: 99.2 F (37.3 C) 99.4 F (37.4 C)  98.9 F (37.2 C)  TempSrc: Oral  Oral  Oral  SpO2:    100%  Weight:   55.4 kg   Height:        Intake/Output Summary (Last 24 hours) at 03/08/2024 0907 Last data filed at 03/07/2024 1536 Gross per 24 hour  Intake --  Output 2500 ml  Net -2500 ml   Filed Weights   03/07/24 1255 03/07/24 1536 03/08/24 0715  Weight: 57 kg 54.5 kg 55.4 kg    General appearance: Awake alert.  In no distress.  Distracted but more communicative today. Resp: Clear to auscultation bilaterally.  Normal effort Cardio: S1-S2 is normal regular.  No S3-S4.  No rubs murmurs or bruit GI: Abdomen is soft.  Nontender nondistended.  Bowel sounds are present normal.  No masses organomegaly Extremities: No edema.  Full range of motion of lower extremities. Neurologic: Moving all 4 extremities but inconsistently.   Lab Results:  Data Reviewed: I have personally reviewed following labs and reports of the imaging studies  CBC: Recent Labs  Lab 03/03/24 0706 03/04/24 0606 03/05/24 0459 03/06/24 0508 03/08/24 0338  WBC 3.9* 4.3 4.3 6.9 8.5  HGB 8.3* 8.5* 8.8* 9.8* 9.5*  HCT 25.6* 25.2* 27.1* 29.4* 28.6*  MCV 96.6 93.3 94.8 95.8 97.9  PLT 185 207 227 267 230    Basic Metabolic Panel: Recent Labs  Lab 03/02/24 0721 03/03/24 0706 03/04/24 0606 03/05/24 0459 03/06/24 0508 03/08/24 0338  NA 137 134*  --  134* 135 136  K 4.4 4.7  --  4.3 3.9 4.8  CL 91* 86*  --  88* 90* 92*  CO2 22 17*  --  21* 25 25  GLUCOSE 90 96  --  89 123* 113*  BUN 45* 57*  --  37* 25* 29*  CREATININE 4.66* 6.10*  --  4.82* 3.93* 4.04*  CALCIUM  10.5* 11.3*  --  10.8* 10.8* 10.7*  MG  --   --  2.5* 2.7* 2.6*  --   PHOS  --   --  4.7* 8.6* 6.5*  --     GFR: Estimated Creatinine Clearance: 15.9 mL/min (A) (by C-G formula based on SCr of 4.04 mg/dL (H)).  Liver Function Tests: Recent Labs  Lab 03/06/24 0508 03/08/24 0338  AST  --  19  ALT  --  <5  ALKPHOS  --  81  BILITOT  --  0.2  PROT  --  6.2*  ALBUMIN 3.0* 2.9*    Recent Labs  Lab 03/03/24 1535   LIPASE 26  Recent Labs  Lab 03/05/24 0459  AMMONIA 18     CBG: Recent Labs  Lab 03/07/24 1547 03/07/24 2012 03/07/24 2354 03/08/24 0359 03/08/24 0800  GLUCAP 170* 137* 126* 119* 124*     Radiology Studies: No results found.      LOS: 23 days   [name] [name]  [name] Pager on [email or web address]  03/08/2024, 9:07 AM

## 2024-03-08 NOTE — Plan of Care (Signed)
  Problem: Clinical Measurements: Goal: Ability to maintain clinical measurements within normal limits will improve Outcome: Progressing Goal: Will remain free from infection Outcome: Progressing Goal: Diagnostic test results will improve Outcome: Progressing Goal: Respiratory complications will improve Outcome: Progressing   Problem: Activity: Goal: Risk for activity intolerance will decrease Outcome: Progressing   Problem: Nutrition: Goal: Adequate nutrition will be maintained Outcome: Progressing   Problem: Coping: Goal: Level of anxiety will decrease Outcome: Progressing   Problem: Elimination: Goal: Will not experience complications related to bowel motility Outcome: Progressing   Problem: Pain Managment: Goal: General experience of comfort will improve and/or be controlled Outcome: Progressing   Problem: Safety: Goal: Ability to remain free from injury will improve Outcome: Progressing   Problem: Skin Integrity: Goal: Risk for impaired skin integrity will decrease Outcome: Progressing   Problem: Coping: Goal: Ability to adjust to condition or change in health will improve Outcome: Progressing   Problem: Metabolic: Goal: Ability to maintain appropriate glucose levels will improve Outcome: Progressing   Problem: Nutritional: Goal: Maintenance of adequate nutrition will improve Outcome: Progressing Goal: Progress toward achieving an optimal weight will improve Outcome: Progressing   Problem: Skin Integrity: Goal: Risk for impaired skin integrity will decrease Outcome: Progressing   Problem: Education: Goal: Knowledge of secondary prevention will improve (MUST DOCUMENT ALL) Outcome: Progressing Goal: Knowledge of patient specific risk factors will improve (DELETE if not current risk factor) Outcome: Progressing   Problem: Intracerebral Hemorrhage Tissue Perfusion: Goal: Complications of Intracerebral Hemorrhage will be minimized Outcome: Progressing    Problem: Health Behavior/Discharge Planning: Goal: Goals will be collaboratively established with patient/family Outcome: Progressing   Problem: Self-Care: Goal: Ability to communicate needs accurately will improve Outcome: Progressing   Problem: Nutrition: Goal: Risk of aspiration will decrease Outcome: Progressing Goal: Dietary intake will improve Outcome: Progressing

## 2024-03-08 NOTE — Progress Notes (Signed)
 Subjective: The patient is alert and pleasant.  She is in no apparent distress.  She is mildly confused without change.  Objective: Vital signs in last 24 hours: Temp:  [97.6 F (36.4 C)-99.4 F (37.4 C)] 98.9 F (37.2 C) (07/05 0757) Pulse Rate:  [101-113] 101 (07/05 0757) Resp:  [12-23] 17 (07/05 0757) BP: (111-192)/(65-94) 158/70 (07/05 0757) SpO2:  [100 %] 100 % (07/05 0757) Weight:  [54.5 kg-57 kg] 55.4 kg (07/05 0715) Estimated body mass index is 18.85 kg/m as calculated from the following:   Height as of this encounter: 5' 7.5 (1.715 m).   Weight as of this encounter: 55.4 kg.   Intake/Output from previous day: 07/04 0701 - 07/05 0700 In: -  Out: 2540 [Stool:40] Intake/Output this shift: No intake/output data recorded.  Physical exam the patient is alert and pleasant.  She is mildly confused.  She moves all 4 extremities.  Lab Results: Recent Labs    03/06/24 0508 03/08/24 0338  WBC 6.9 8.5  HGB 9.8* 9.5*  HCT 29.4* 28.6*  PLT 267 230   BMET Recent Labs    03/06/24 0508 03/08/24 0338  NA 135 136  K 3.9 4.8  CL 90* 92*  CO2 25 25  GLUCOSE 123* 113*  BUN 25* 29*  CREATININE 3.93* 4.04*  CALCIUM  10.8* 10.7*    Studies/Results: No results found.  Assessment/Plan: Subarachnoid hemorrhage, hydrocephalus, arteriogram negative: Patient is neurologically stable.  We will plan to repeat her CAT scan on Monday to check for hydrocephalus.  LOS: 23 days     [name] [name] 03/08/2024, 9:45 AM     Patient ID: [name] [name], female   DOB: 11-29-1980, 43 y.o.   MRN: [id number]

## 2024-03-08 NOTE — Plan of Care (Signed)
  Problem: Clinical Measurements: Goal: Ability to maintain clinical measurements within normal limits will improve Outcome: Progressing Goal: Will remain free from infection Outcome: Progressing Goal: Diagnostic test results will improve Outcome: Progressing Goal: Respiratory complications will improve Outcome: Progressing Goal: Cardiovascular complication will be avoided Outcome: Progressing   Problem: Activity: Goal: Risk for activity intolerance will decrease Outcome: Progressing   Problem: Nutrition: Goal: Adequate nutrition will be maintained Outcome: Progressing   Problem: Coping: Goal: Level of anxiety will decrease Outcome: Progressing   Problem: Elimination: Goal: Will not experience complications related to bowel motility Outcome: Progressing   Problem: Pain Managment: Goal: General experience of comfort will improve and/or be controlled Outcome: Progressing   Problem: Safety: Goal: Ability to remain free from injury will improve Outcome: Progressing   Problem: Skin Integrity: Goal: Risk for impaired skin integrity will decrease Outcome: Progressing   Problem: Coping: Goal: Ability to adjust to condition or change in health will improve Outcome: Progressing   Problem: Fluid Volume: Goal: Ability to maintain a balanced intake and output will improve Outcome: Progressing   Problem: Metabolic: Goal: Ability to maintain appropriate glucose levels will improve Outcome: Progressing   Problem: Nutritional: Goal: Maintenance of adequate nutrition will improve Outcome: Progressing Goal: Progress toward achieving an optimal weight will improve Outcome: Progressing   Problem: Skin Integrity: Goal: Risk for impaired skin integrity will decrease Outcome: Progressing   Problem: Tissue Perfusion: Goal: Adequacy of tissue perfusion will improve Outcome: Progressing   Problem: Education: Goal: Knowledge of secondary prevention will improve (MUST DOCUMENT  ALL) Outcome: Progressing Goal: Knowledge of patient specific risk factors will improve (DELETE if not current risk factor) Outcome: Progressing   Problem: Intracerebral Hemorrhage Tissue Perfusion: Goal: Complications of Intracerebral Hemorrhage will be minimized Outcome: Progressing   Problem: Health Behavior/Discharge Planning: Goal: Goals will be collaboratively established with patient/family Outcome: Progressing   Problem: Self-Care: Goal: Ability to participate in self-care as condition permits will improve Outcome: Progressing Goal: Verbalization of feelings and concerns over difficulty with self-care will improve Outcome: Progressing Goal: Ability to communicate needs accurately will improve Outcome: Progressing   Problem: Nutrition: Goal: Risk of aspiration will decrease Outcome: Progressing Goal: Dietary intake will improve Outcome: Progressing

## 2024-03-08 NOTE — Plan of Care (Signed)
 Problem: Clinical Measurements: Goal: Ability to maintain clinical measurements within normal limits will improve 03/08/2024 2031 by Valorie Inocente NOVAK, RN Outcome: Progressing 03/08/2024 2029 by Valorie Inocente NOVAK, RN Outcome: Progressing Goal: Will remain free from infection 03/08/2024 2031 by Valorie Inocente NOVAK, RN Outcome: Progressing 03/08/2024 2029 by Valorie Inocente NOVAK, RN Outcome: Progressing Goal: Diagnostic test results will improve 03/08/2024 2031 by Valorie Inocente NOVAK, RN Outcome: Progressing 03/08/2024 2029 by Valorie Inocente NOVAK, RN Outcome: Progressing Goal: Respiratory complications will improve 03/08/2024 2031 by Valorie Inocente NOVAK, RN Outcome: Progressing 03/08/2024 2029 by Valorie Inocente NOVAK, RN Outcome: Progressing   Problem: Activity: Goal: Risk for activity intolerance will decrease 03/08/2024 2031 by Valorie Inocente NOVAK, RN Outcome: Progressing 03/08/2024 2029 by Valorie Inocente NOVAK, RN Outcome: Progressing   Problem: Nutrition: Goal: Adequate nutrition will be maintained 03/08/2024 2031 by Valorie Inocente NOVAK, RN Outcome: Progressing 03/08/2024 2029 by Valorie Inocente NOVAK, RN Outcome: Progressing   Problem: Coping: Goal: Level of anxiety will decrease 03/08/2024 2031 by Valorie Inocente NOVAK, RN Outcome: Progressing 03/08/2024 2029 by Valorie Inocente NOVAK, RN Outcome: Progressing   Problem: Elimination: Goal: Will not experience complications related to bowel motility 03/08/2024 2031 by Valorie Inocente NOVAK, RN Outcome: Progressing 03/08/2024 2029 by Valorie Inocente NOVAK, RN Outcome: Progressing   Problem: Pain Managment: Goal: General experience of comfort will improve and/or be controlled 03/08/2024 2031 by Valorie Inocente NOVAK, RN Outcome: Progressing 03/08/2024 2029 by Valorie Inocente NOVAK, RN Outcome: Progressing   Problem: Safety: Goal: Ability to remain free from injury will improve 03/08/2024 2031 by Valorie Inocente NOVAK, RN Outcome: Progressing 03/08/2024 2029 by Valorie Inocente NOVAK, RN Outcome: Progressing   Problem:  Skin Integrity: Goal: Risk for impaired skin integrity will decrease 03/08/2024 2031 by Valorie Inocente NOVAK, RN Outcome: Progressing 03/08/2024 2029 by Valorie Inocente NOVAK, RN Outcome: Progressing   Problem: Coping: Goal: Ability to adjust to condition or change in health will improve 03/08/2024 2031 by Valorie Inocente NOVAK, RN Outcome: Progressing 03/08/2024 2029 by Valorie Inocente NOVAK, RN Outcome: Progressing   Problem: Metabolic: Goal: Ability to maintain appropriate glucose levels will improve 03/08/2024 2031 by Valorie Inocente NOVAK, RN Outcome: Progressing 03/08/2024 2029 by Valorie Inocente NOVAK, RN Outcome: Progressing   Problem: Nutritional: Goal: Maintenance of adequate nutrition will improve 03/08/2024 2031 by Valorie Inocente NOVAK, RN Outcome: Progressing 03/08/2024 2029 by Valorie Inocente NOVAK, RN Outcome: Progressing Goal: Progress toward achieving an optimal weight will improve 03/08/2024 2031 by Valorie Inocente NOVAK, RN Outcome: Progressing 03/08/2024 2029 by Valorie Inocente NOVAK, RN Outcome: Progressing   Problem: Skin Integrity: Goal: Risk for impaired skin integrity will decrease 03/08/2024 2031 by Valorie Inocente NOVAK, RN Outcome: Progressing 03/08/2024 2029 by Valorie Inocente NOVAK, RN Outcome: Progressing   Problem: Education: Goal: Knowledge of secondary prevention will improve (MUST DOCUMENT ALL) 03/08/2024 2031 by Valorie Inocente NOVAK, RN Outcome: Progressing 03/08/2024 2029 by Valorie Inocente NOVAK, RN Outcome: Progressing Goal: Knowledge of patient specific risk factors will improve (DELETE if not current risk factor) 03/08/2024 2031 by Valorie Inocente NOVAK, RN Outcome: Progressing 03/08/2024 2029 by Valorie Inocente NOVAK, RN Outcome: Progressing   Problem: Intracerebral Hemorrhage Tissue Perfusion: Goal: Complications of Intracerebral Hemorrhage will be minimized 03/08/2024 2031 by Valorie Inocente NOVAK, RN Outcome: Progressing 03/08/2024 2029 by Valorie Inocente NOVAK, RN Outcome: Progressing   Problem: Health Behavior/Discharge  Planning: Goal: Goals will be collaboratively established with patient/family 03/08/2024 2031 by Valorie Inocente NOVAK, RN Outcome: Progressing 03/08/2024 2029 by Valorie Inocente NOVAK, RN Outcome: Progressing  Problem: Self-Care: Goal: Ability to communicate needs accurately will improve 03/08/2024 2031 by Valorie Inocente NOVAK, RN Outcome: Progressing 03/08/2024 2029 by Valorie Inocente NOVAK, RN Outcome: Progressing   Problem: Nutrition: Goal: Risk of aspiration will decrease 03/08/2024 2031 by Valorie Inocente NOVAK, RN Outcome: Progressing 03/08/2024 2029 by Valorie Inocente NOVAK, RN Outcome: Progressing Goal: Dietary intake will improve 03/08/2024 2031 by Valorie Inocente NOVAK, RN Outcome: Progressing 03/08/2024 2029 by Valorie Inocente NOVAK, RN Outcome: Progressing

## 2024-03-08 NOTE — Progress Notes (Signed)
 [facility] Progress Note   Subjective:   Patient lethargic and not interactive.  No complaints.  Tolerated dialysis yesterday with no issues  Objective Vitals:   03/07/24 2357 03/08/24 0356 03/08/24 0715 03/08/24 0757  BP: (!) 140/74   (!) 158/70  Pulse:    (!) 101  Resp:    17  Temp: 99.2 F (37.3 C) 99.4 F (37.4 C)  98.9 F (37.2 C)  TempSrc: Oral Oral  Oral  SpO2:    100%  Weight:   55.4 kg   Height:       Physical Exam General: Lying in bed, no distress Heart: Normal rate, murmur present, no rub Lungs: bilateral chest rise, no increased work of breathing Abdomen: soft, nontender Extremities: No lower extremity edema, warm and well-perfused  Dialysis Access:  RUE AVF +t/b  OP HD:  MWF AF 3h  B400   57.2kg  2K   AVF  Heparin  none - Hectoral 4mcg IV q HD - Mircera 150mcg IV q 2 weeks (last given 6/9 -> due 6/23)   Assessment/Plan: Intracerebral hemorrhage: C/b hydrocephalus, 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 TCDs with evidence of vasospasm 02/11/24. Followed closely by neurosurgery, evd removed 6/30 CVA: New CVA found on 7/1.  Management per primary team BP/volume: bp's stable, continue with ultrafiltration as tolerated ESRD: MWF HD. S/p CRRT 6/12-6/12, now back to iHD. Continue dialysis per schedule Anemia of ESRD: Hgb 8-10 here. Started darbe at 60 micrograms sq weekly. Iron  adequate Secondary HPTH:Ca and Phos high, VDRA stopped 6/21, added sevelamer  via tube. PTH 115.  Could consider low-dose Sensipar .  Continue to monitor for now because I think hypercalcemia likely a minimal problem at this point Pancreas/ kidney transplant (2014): for the pancreas, we resumed her usual dosing of the CyA at 225mg  qam and 200 mg qpm. She had been underdosed here for about 2 wks.    03/08/2024, 8:49 AM  Recent Labs  Lab 03/05/24 0459 03/06/24 0508 03/08/24 0338  HGB 8.8* 9.8* 9.5*  ALBUMIN  --  3.0* 2.9*  CALCIUM  10.8* 10.8* 10.7*  PHOS 8.6*  6.5*  --   CREATININE 4.82* 3.93* 4.04*  K 4.3 3.9 4.8    Inpatient medications:  acetaminophen   650 mg Per Tube Q4H   Or   acetaminophen   650 mg Rectal Q4H   aspirin   81 mg Per Tube Daily   Chlorhexidine  Gluconate Cloth  6 each Topical Q0600   cycloSPORINE   200 mg Per Tube QHS   cycloSPORINE   225 mg Per Tube Daily   darbepoetin (ARANESP ) injection - DIALYSIS  60 mcg Subcutaneous Q Fri-1800   heparin  injection (subcutaneous)  5,000 Units Subcutaneous Q8H   insulin  aspart  0-9 Units Subcutaneous Q4H   lacosamide   100 mg Per Tube BID   metoCLOPramide  (REGLAN ) injection  5 mg Intravenous Q8H   midodrine   5 mg Per Tube Q8H   multivitamin  1 tablet Per Tube QHS   mycophenolate   500 mg Per Tube BID   mouth rinse  15 mL Mouth Rinse 4 times per day   predniSONE   15 mg Per Tube Q breakfast   sevelamer  carbonate  2.4 g Per Tube TID WC   sodium bicarbonate   1,300 mg Per Tube TID   sodium chloride  flush  3 mL Intravenous Q12H   sodium chloride  flush  3-10 mL Intravenous Q12H    anticoagulant sodium citrate      feeding supplement (VITAL 1.5 CAL) Stopped (03/08/24 0848)   albuterol ,  alteplase , anticoagulant sodium citrate , artificial tears, atropine , butalbital -acetaminophen -caffeine , feeding supplement (NEPRO CARB STEADY), Gerhardt's butt cream, heparin , hydrALAZINE , iohexol , labetalol , lidocaine  (PF), lidocaine -prilocaine , loperamide  HCl, morphine  injection, naLOXone  (NARCAN )  injection, ondansetron  (ZOFRAN ) IV, mouth rinse, pentafluoroprop-tetrafluoroeth, simethicone         \

## 2024-03-09 DIAGNOSIS — E440 Moderate protein-calorie malnutrition: Secondary | ICD-10-CM | POA: Diagnosis not present

## 2024-03-09 DIAGNOSIS — R1312 Dysphagia, oropharyngeal phase: Secondary | ICD-10-CM

## 2024-03-09 DIAGNOSIS — I609 Nontraumatic subarachnoid hemorrhage, unspecified: Secondary | ICD-10-CM | POA: Diagnosis not present

## 2024-03-09 DIAGNOSIS — N186 End stage renal disease: Secondary | ICD-10-CM | POA: Diagnosis not present

## 2024-03-09 LAB — GLUCOSE, CAPILLARY
Glucose-Capillary: 106 mg/dL — ABNORMAL HIGH (ref 70–99)
Glucose-Capillary: 158 mg/dL — ABNORMAL HIGH (ref 70–99)
Glucose-Capillary: 133 mg/dL — ABNORMAL HIGH (ref 70–99)
Glucose-Capillary: 142 mg/dL — ABNORMAL HIGH (ref 70–99)
Glucose-Capillary: 131 mg/dL — ABNORMAL HIGH (ref 70–99)
Glucose-Capillary: 110 mg/dL — ABNORMAL HIGH (ref 70–99)

## 2024-03-09 MED ORDER — LABETALOL HCL 5 MG/ML IV SOLN
10.0000 mg | Freq: Once | INTRAVENOUS | Status: AC
  Administered 2024-03-09: 10 mg via INTRAVENOUS
  Filled 2024-03-09: qty 4

## 2024-03-09 NOTE — Progress Notes (Signed)
 Subjective: The patient is alert and pleasant.  She is in no apparent distress.  Objective: Vital signs in last 24 hours: Temp:  [97.9 F (36.6 C)-99.2 F (37.3 C)] 98.6 F (37 C) (07/06 0320) Pulse Rate:  [89-110] 98 (07/06 0500) Resp:  [13-21] 17 (07/06 0500) BP: (143-198)/(70-93) 143/72 (07/06 0400) SpO2:  [95 %-100 %] 98 % (07/06 0500) Weight:  [56 kg] 56 kg (07/06 0442) Estimated body mass index is 19.05 kg/m as calculated from the following:   Height as of this encounter: 5' 7.5 (1.715 m).   Weight as of this encounter: 56 kg.   Intake/Output from previous day: 07/05 0701 - 07/06 0700 In: 1053 [I.V.:3; NG/GT:960] Out: 660 [Stool:660] Intake/Output this shift: No intake/output data recorded.  Physical exam the patient is alert and pleasant.  She is mildly confused without change.  Lab Results: Recent Labs    03/08/24 0338  WBC 8.5  HGB 9.5*  HCT 28.6*  PLT 230   BMET Recent Labs    03/08/24 0338  NA 136  K 4.8  CL 92*  CO2 25  GLUCOSE 113*  BUN 29*  CREATININE 4.04*  CALCIUM  10.7*    Studies/Results: No results found.  Assessment/Plan: Status post subarachnoid hemorrhage day #23: The patient is neurologically stable.  It looks like she will need skilled nursing facility placement versus rehab.  I will sign off.  Please call if I can be of further assistance.  LOS: 24 days     [name] [name] [name] 03/09/2024, 7:31 AM     Patient ID: [name] [name], female   DOB: 14-Feb-1981, 43 y.o.   MRN: [id number]

## 2024-03-09 NOTE — Progress Notes (Signed)
 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 Hypertensive in ED SBP 240s.  CT scan suggested colitis.  Patient's mentation continued to worsen.CT head was subsequently done which revealed intracranial hemorrhage.  Patient was admitted to the ICU.    Consultants: Critical care medicine.  Neurosurgery.  Neurology.  Nephrology  Significant Hospital Events:  6/11 ED w n/v AMS. Abx IVF for colitis 6/12 Admit. AMS decr LOC. Incr O2 req. PCCM consult and code stroke called seen w rapid response nephro NP then neuro  6/13 Intubated with EVD, responding to commands  6/14 Extubated 6/17 acute L cerebellar infarct. Keppra  dc  6/18 Dialysis, fevering, EVD at 10 (129 mL last 24 hrs) 6/19 Narcotic dependence a driving factor. +Vasospasm on TCD. CTA with evolving Left cerebellar infarct, no HT or mass effect, substantially decreased caliber of multiple intracranial arteries and bilateral Circle-of-Willis branches (especially: Distal left V4, supraclinoid ICAs, MCA and ACA origins), appearance c/w widespread Vasospasm, Vasoconstriction. No associated large vessel occlusion. 6/21 CSF sampline from EVD -- WBC 244 RBC 19000, cx ngtd  6/25: diagnostic angio today with nsgy - negative 6/26 evd leaking when clamped  6/30 evd removed, HD overnight, HTN improved, 7 days cefazolin  complete 7/1 more lethargic> minimal response to narcan  x2, oxy stopped, EEG neg, MRI - new small left parietal cva, cortrak placed postpyloric, vagal episodes with N/V 7/2 iHD, vimpat  started    Subjective/Interval History: Patient noted to be more interactive this morning.  Complains of headache.  Keeps repeating the same thing though.     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  Underwent placement of right frontal pressure ventriculostomy via bur hole. Management per neurology and neurosurgery. Ventriculostomy was removed on 6/30. Further management per neurosurgery. Seems to be stable from neurological standpoint except for headaches.  Continue scheduled Tylenol  and as needed Fioricet .  Also noted to be on as needed morphine .  Could add as needed oxycodone .  Concern for seizure activity Seen by neurology.  Underwent EEG.  Patient was started on Vimpat .  Improvement noted.  Vasospasm with left cerebellar infarct Seen by neurology.  Noted to be on aspirin . LDL 63.  HbA1c 4.2. Patient noted to be on midodrine  for BP augmentation.  Was noted to be hypertensive.  Dose of midodrine  was decreased to twice a day. Blood pressures are noted to be better today.  Continue to monitor.   PT and OT following.  SNF being pursued.  End-stage renal disease on hemodialysis History of failed transplant is present.  She is chronically immunosuppressed with CellCept  cyclosporine  and prednisone . Management per nephrology. Noted to be on sodium bicarbonate   Bradycardia Secondary to vagal response. Resolved.  Oropharyngeal dysphagia Noted to have core track feeding tube.  Dietitian following.  Last seen by speech therapy on 7/2.  Patient has shown improvement in the last 48 hours.  Will have speech therapy reevaluate tomorrow.    Colitis Stable.  Status post pancreatic transplant/diabetes Monitor CBGs.  Anemia of chronic illness Stable  Moderate protein calorie malnutrition Nutrition Problem: Moderate Malnutrition Etiology: chronic illness (ESRD on HD/DM)  DVT Prophylaxis: Subcutaneous heparin  Code Status: Full code Family Communication: No family at bedside Disposition Plan: SNF when medically  improved     Medications: Scheduled:  acetaminophen   650 mg Per Tube Q4H   Or   acetaminophen   650 mg Rectal Q4H   aspirin   81 mg Per Tube Daily   cycloSPORINE   200 mg Per Tube QHS   cycloSPORINE   225 mg Per Tube Daily   darbepoetin (ARANESP ) injection - DIALYSIS  60 mcg Subcutaneous Q Fri-1800   heparin  injection (subcutaneous)  5,000 Units Subcutaneous Q8H   insulin  aspart  0-9 Units Subcutaneous Q4H   lacosamide   100 mg Per Tube BID   metoCLOPramide  (REGLAN ) injection  5 mg Intravenous Q8H   midodrine   5 mg Per Tube BID WC   multivitamin  1 tablet Per Tube QHS   mycophenolate   500 mg Per Tube BID   predniSONE   15 mg Per Tube Q breakfast   sevelamer  carbonate  2.4 g Per Tube TID WC   sodium bicarbonate   1,300 mg Per Tube TID   sodium chloride  flush  3 mL Intravenous Q12H   Continuous:  anticoagulant sodium citrate      feeding supplement (VITAL 1.5 CAL) 50 mL/hr at 03/09/24 0700   PRN:albuterol , alteplase , anticoagulant sodium citrate , artificial tears, atropine , butalbital -acetaminophen -caffeine , feeding supplement (NEPRO CARB STEADY), Gerhardt's butt cream, heparin , hydrALAZINE , iohexol , lidocaine  (PF), lidocaine -prilocaine , loperamide  HCl, morphine  injection, naLOXone  (NARCAN )  injection, ondansetron  (ZOFRAN ) IV, mouth rinse, pentafluoroprop-tetrafluoroeth, simethicone   Antibiotics: Anti-infectives (From admission, onward)    Start     Dose/Rate Route Frequency Ordered Stop   02/27/24 1200  vancomycin  (VANCOREADY) IVPB 500 mg/100 mL  Status:  Discontinued        500 mg 100 mL/hr over 60 Minutes Intravenous Every M-W-F (Hemodialysis) 02/25/24 1129 02/25/24 1436   02/26/24 2200  ceFAZolin  (ANCEF ) IVPB 1 g/50 mL premix        1 g 100 mL/hr over 30 Minutes Intravenous Every 24 hours 02/26/24 1009 03/03/24 2140   02/25/24 1530  vancomycin  (VANCOREADY) IVPB 500 mg/100 mL        500 mg 100 mL/hr over 60 Minutes Intravenous Every M-W-F (Hemodialysis) 02/25/24 1436 02/25/24 1934    02/23/24 1800  meropenem  (MERREM ) 1 g in sodium chloride  0.9 % 100 mL IVPB  Status:  Discontinued        1 g 200 mL/hr over 30 Minutes Intravenous Every 24 hours 02/23/24 1625 02/26/24 1009   02/23/24 1800  vancomycin  (VANCOREADY) IVPB 1250 mg/250 mL        1,250 mg 166.7 mL/hr over 90 Minutes Intravenous  Once 02/23/24 1625 02/23/24 1933   02/17/24 2200  piperacillin -tazobactam (ZOSYN ) IVPB 2.25 g        2.25 g 100 mL/hr over 30 Minutes Intravenous Every 8 hours 02/17/24 1412 02/21/24 2336   02/14/24 1800  vancomycin  (VANCOREADY) IVPB 2000 mg/400 mL        2,000 mg 200 mL/hr over 120 Minutes Intravenous  Once 02/14/24 1709 02/14/24 2048   02/14/24 1622  piperacillin -tazobactam (ZOSYN ) IVPB 3.375 g  Status:  Discontinued        3.375 g 100 mL/hr over 30 Minutes Intravenous Every 6 hours 02/14/24 1622 02/17/24 1412   02/14/24 1545  piperacillin -tazobactam (ZOSYN ) IVPB 3.375 g  Status:  Discontinued        3.375 g 12.5 mL/hr over 240 Minutes Intravenous Every 8 hours 02/14/24 1451 02/14/24 1453   02/14/24 1545  piperacillin -tazobactam (ZOSYN ) IVPB 2.25 g  Status:  Discontinued        2.25 g 100 mL/hr over 30 Minutes Intravenous Every 8  hours 02/14/24 1453 02/14/24 1622   02/14/24 0800  cefTRIAXone  (ROCEPHIN ) 2 g in sodium chloride  0.9 % 100 mL IVPB  Status:  Discontinued        2 g 200 mL/hr over 30 Minutes Intravenous Every 24 hours 02/14/24 0730 02/14/24 1451   02/14/24 0800  metroNIDAZOLE  (FLAGYL ) IVPB 500 mg  Status:  Discontinued        500 mg 100 mL/hr over 60 Minutes Intravenous Every 12 hours 02/14/24 0730 02/14/24 1451       Objective:  Vital Signs  Vitals:   03/09/24 0400 03/09/24 0442 03/09/24 0500 03/09/24 0700  BP: (!) 143/72   (!) 151/67  Pulse: 96  98 99  Resp: 14  17 17   Temp:    97.6 F (36.4 C)  TempSrc:    Axillary  SpO2: 98%  98% 99%  Weight:  56 kg    Height:        Intake/Output Summary (Last 24 hours) at 03/09/2024 0852 Last data filed at  03/09/2024 0819 Gross per 24 hour  Intake 1118.83 ml  Output 620 ml  Net 498.83 ml   Filed Weights   03/07/24 1536 03/08/24 0715 03/09/24 0442  Weight: 54.5 kg 55.4 kg 56 kg    General appearance: Awake alert.  In no distress.  Distracted. Resp: Clear to auscultation bilaterally.  Normal effort Cardio: S1-S2 is normal regular.  No S3-S4.  No rubs murmurs or bruit GI: Abdomen is soft.  Nontender nondistended.  Bowel sounds are present normal.  No masses organomegaly    Lab Results:  Data Reviewed: I have personally reviewed following labs and reports of the imaging studies  CBC: Recent Labs  Lab 03/03/24 0706 03/04/24 0606 03/05/24 0459 03/06/24 0508 03/08/24 0338  WBC 3.9* 4.3 4.3 6.9 8.5  HGB 8.3* 8.5* 8.8* 9.8* 9.5*  HCT 25.6* 25.2* 27.1* 29.4* 28.6*  MCV 96.6 93.3 94.8 95.8 97.9  PLT 185 207 227 267 230    Basic Metabolic Panel: Recent Labs  Lab 03/03/24 0706 03/04/24 0606 03/05/24 0459 03/06/24 0508 03/08/24 0338  NA 134*  --  134* 135 136  K 4.7  --  4.3 3.9 4.8  CL 86*  --  88* 90* 92*  CO2 17*  --  21* 25 25  GLUCOSE 96  --  89 123* 113*  BUN 57*  --  37* 25* 29*  CREATININE 6.10*  --  4.82* 3.93* 4.04*  CALCIUM  11.3*  --  10.8* 10.8* 10.7*  MG  --  2.5* 2.7* 2.6*  --   PHOS  --  4.7* 8.6* 6.5*  --     GFR: Estimated Creatinine Clearance: 16 mL/min (A) (by C-G formula based on SCr of 4.04 mg/dL (H)).  Liver Function Tests: Recent Labs  Lab 03/06/24 0508 03/08/24 0338  AST  --  19  ALT  --  <5  ALKPHOS  --  81  BILITOT  --  0.2  PROT  --  6.2*  ALBUMIN 3.0* 2.9*    Recent Labs  Lab 03/03/24 1535  LIPASE 26   Recent Labs  Lab 03/05/24 0459  AMMONIA 18     CBG: Recent Labs  Lab 03/08/24 1534 03/08/24 2000 03/08/24 2330 03/09/24 0334 03/09/24 0801  GLUCAP 134* 99 124* 106* 158*     Radiology Studies: No results found.      LOS: 24 days   Zuri Bradway  [name] Pager on [email or web address]  03/09/2024,  8:52 AM

## 2024-03-09 NOTE — Progress Notes (Signed)
 [facility] Progress Note   Subjective:   More interactive today.  Says she has a headache.  Otherwise no complaints  Objective Vitals:   03/09/24 0400 03/09/24 0442 03/09/24 0500 03/09/24 0700  BP: (!) 143/72   (!) 151/67  Pulse: 96  98 99  Resp: 14  17 17   Temp:    97.6 F (36.4 C)  TempSrc:    Axillary  SpO2: 98%  98% 99%  Weight:  56 kg    Height:       Physical Exam General: Lying in bed, no distress Heart: Normal rate, murmur present, no rub Lungs: bilateral chest rise, no increased work of breathing Abdomen: soft, nontender Extremities: No lower extremity edema, warm and well-perfused  Dialysis Access:  RUE AVF +t/b  OP HD:  MWF AF 3h  B400   57.2kg  2K   AVF  Heparin  none - Hectoral 4mcg IV q HD - Mircera 150mcg IV q 2 weeks (last given 6/9 -> due 6/23)   Assessment/Plan: Intracerebral hemorrhage: C/b hydrocephalus, 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 TCDs with evidence of vasospasm 02/11/24. Followed closely by neurosurgery, evd removed 6/30 CVA: New CVA found on 7/1.  Management per primary team BP/volume: bp's stable, continue with ultrafiltration as tolerated ESRD: MWF HD. S/p CRRT 6/12-6/12, now back to iHD. Continue dialysis per schedule Anemia of ESRD: Hgb 8-10 here. Started darbe at 60 micrograms sq weekly. Iron  adequate Secondary HPTH:Ca and Phos high, VDRA stopped 6/21, added sevelamer  via tube. PTH 115.  Could consider low-dose Sensipar .  Continue to monitor for now because I think hypercalcemia likely a minimal problem at this point Pancreas/ kidney transplant (2014): for the pancreas, we resumed her usual dosing of the CyA at 225mg  qam and 200 mg qpm. She had been underdosed here for about 2 wks.    03/09/2024, 8:51 AM  Recent Labs  Lab 03/05/24 0459 03/06/24 0508 03/08/24 0338  HGB 8.8* 9.8* 9.5*  ALBUMIN  --  3.0* 2.9*  CALCIUM  10.8* 10.8* 10.7*  PHOS 8.6* 6.5*  --   CREATININE 4.82* 3.93* 4.04*  K 4.3 3.9 4.8     Inpatient medications:  acetaminophen   650 mg Per Tube Q4H   Or   acetaminophen   650 mg Rectal Q4H   aspirin   81 mg Per Tube Daily   cycloSPORINE   200 mg Per Tube QHS   cycloSPORINE   225 mg Per Tube Daily   darbepoetin (ARANESP ) injection - DIALYSIS  60 mcg Subcutaneous Q Fri-1800   heparin  injection (subcutaneous)  5,000 Units Subcutaneous Q8H   insulin  aspart  0-9 Units Subcutaneous Q4H   lacosamide   100 mg Per Tube BID   metoCLOPramide  (REGLAN ) injection  5 mg Intravenous Q8H   midodrine   5 mg Per Tube BID WC   multivitamin  1 tablet Per Tube QHS   mycophenolate   500 mg Per Tube BID   predniSONE   15 mg Per Tube Q breakfast   sevelamer  carbonate  2.4 g Per Tube TID WC   sodium bicarbonate   1,300 mg Per Tube TID   sodium chloride  flush  3 mL Intravenous Q12H    anticoagulant sodium citrate      feeding supplement (VITAL 1.5 CAL) 50 mL/hr at 03/09/24 0700   albuterol , alteplase , anticoagulant sodium citrate , artificial tears, atropine , butalbital -acetaminophen -caffeine , feeding supplement (NEPRO CARB STEADY), Gerhardt's butt cream, heparin , hydrALAZINE , iohexol , lidocaine  (PF), lidocaine -prilocaine , loperamide  HCl, morphine  injection, naLOXone  (NARCAN )  injection, ondansetron  (ZOFRAN ) IV, mouth rinse, pentafluoroprop-tetrafluoroeth, simethicone         \

## 2024-03-09 NOTE — Plan of Care (Signed)
  Problem: Clinical Measurements: Goal: Ability to maintain clinical measurements within normal limits will improve Outcome: Progressing Goal: Will remain free from infection Outcome: Progressing Goal: Diagnostic test results will improve Outcome: Progressing Goal: Respiratory complications will improve Outcome: Progressing   Problem: Activity: Goal: Risk for activity intolerance will decrease Outcome: Progressing   Problem: Nutrition: Goal: Adequate nutrition will be maintained Outcome: Progressing   Problem: Coping: Goal: Level of anxiety will decrease Outcome: Progressing   Problem: Elimination: Goal: Will not experience complications related to bowel motility Outcome: Progressing   Problem: Pain Managment: Goal: General experience of comfort will improve and/or be controlled Outcome: Progressing   Problem: Safety: Goal: Ability to remain free from injury will improve Outcome: Progressing   Problem: Skin Integrity: Goal: Risk for impaired skin integrity will decrease Outcome: Progressing   Problem: Coping: Goal: Ability to adjust to condition or change in health will improve Outcome: Progressing   Problem: Metabolic: Goal: Ability to maintain appropriate glucose levels will improve Outcome: Progressing   Problem: Nutritional: Goal: Maintenance of adequate nutrition will improve Outcome: Progressing Goal: Progress toward achieving an optimal weight will improve Outcome: Progressing   Problem: Skin Integrity: Goal: Risk for impaired skin integrity will decrease Outcome: Progressing   Problem: Education: Goal: Knowledge of secondary prevention will improve (MUST DOCUMENT ALL) Outcome: Progressing Goal: Knowledge of patient specific risk factors will improve (DELETE if not current risk factor) Outcome: Progressing   Problem: Intracerebral Hemorrhage Tissue Perfusion: Goal: Complications of Intracerebral Hemorrhage will be minimized Outcome: Progressing    Problem: Health Behavior/Discharge Planning: Goal: Goals will be collaboratively established with patient/family Outcome: Progressing   Problem: Self-Care: Goal: Ability to communicate needs accurately will improve Outcome: Progressing   Problem: Nutrition: Goal: Risk of aspiration will decrease Outcome: Progressing Goal: Dietary intake will improve Outcome: Progressing

## 2024-03-10 ENCOUNTER — Inpatient Hospital Stay (HOSPITAL_COMMUNITY)

## 2024-03-10 ENCOUNTER — Other Ambulatory Visit (HOSPITAL_COMMUNITY)

## 2024-03-10 DIAGNOSIS — E440 Moderate protein-calorie malnutrition: Secondary | ICD-10-CM | POA: Diagnosis not present

## 2024-03-10 DIAGNOSIS — R1312 Dysphagia, oropharyngeal phase: Secondary | ICD-10-CM | POA: Diagnosis not present

## 2024-03-10 DIAGNOSIS — N186 End stage renal disease: Secondary | ICD-10-CM | POA: Diagnosis not present

## 2024-03-10 DIAGNOSIS — I609 Nontraumatic subarachnoid hemorrhage, unspecified: Secondary | ICD-10-CM | POA: Diagnosis not present

## 2024-03-10 LAB — COMPREHENSIVE METABOLIC PANEL WITH GFR
ALT: 9 U/L (ref 0–44)
AST: 29 U/L (ref 15–41)
Albumin: 2.9 g/dL — ABNORMAL LOW (ref 3.5–5.0)
Alkaline Phosphatase: 109 U/L (ref 38–126)
Anion gap: 20 — ABNORMAL HIGH (ref 5–15)
BUN: 60 mg/dL — ABNORMAL HIGH (ref 6–20)
CO2: 27 mmol/L (ref 22–32)
Calcium: 10.6 mg/dL — ABNORMAL HIGH (ref 8.9–10.3)
Chloride: 82 mmol/L — ABNORMAL LOW (ref 98–111)
Creatinine, Ser: 6.78 mg/dL — ABNORMAL HIGH (ref 0.44–1.00)
GFR, Estimated: 7 mL/min — ABNORMAL LOW (ref >60)
Glucose, Bld: 123 mg/dL — ABNORMAL HIGH (ref 70–99)
Potassium: 6.6 mmol/L (ref 3.5–5.1)
Sodium: 129 mmol/L — ABNORMAL LOW (ref 135–145)
Total Bilirubin: 0.7 mg/dL (ref 0.0–1.2)
Total Protein: 6.2 g/dL — ABNORMAL LOW (ref 6.5–8.1)

## 2024-03-10 LAB — CBC
HCT: 30.7 % — ABNORMAL LOW (ref 36.0–46.0)
Hemoglobin: 10.1 g/dL — ABNORMAL LOW (ref 12.0–15.0)
MCH: 32 pg (ref 26.0–34.0)
MCHC: 32.9 g/dL (ref 30.0–36.0)
MCV: 97.2 fL (ref 80.0–100.0)
Platelets: 213 K/uL (ref 150–400)
RBC: 3.16 MIL/uL — ABNORMAL LOW (ref 3.87–5.11)
RDW: 15.6 % — ABNORMAL HIGH (ref 11.5–15.5)
WBC: 6.4 K/uL (ref 4.0–10.5)
nRBC: 0 % (ref 0.0–0.2)

## 2024-03-10 LAB — GLUCOSE, CAPILLARY
Glucose-Capillary: 112 mg/dL — ABNORMAL HIGH (ref 70–99)
Glucose-Capillary: 131 mg/dL — ABNORMAL HIGH (ref 70–99)
Glucose-Capillary: 123 mg/dL — ABNORMAL HIGH (ref 70–99)
Glucose-Capillary: 134 mg/dL — ABNORMAL HIGH (ref 70–99)
Glucose-Capillary: 136 mg/dL — ABNORMAL HIGH (ref 70–99)
Glucose-Capillary: 118 mg/dL — ABNORMAL HIGH (ref 70–99)
Glucose-Capillary: 130 mg/dL — ABNORMAL HIGH (ref 70–99)

## 2024-03-10 LAB — POTASSIUM: Potassium: 4.4 mmol/L (ref 3.5–5.1)

## 2024-03-10 MED ORDER — LORAZEPAM 2 MG/ML IJ SOLN
0.5000 mg | Freq: Once | INTRAMUSCULAR | Status: AC
  Administered 2024-03-10: 0.5 mg via INTRAVENOUS
  Filled 2024-03-10: qty 1

## 2024-03-10 MED ORDER — CALCIUM GLUCONATE 10 % IV SOLN
1.0000 g | Freq: Once | INTRAVENOUS | Status: DC
  Filled 2024-03-10: qty 10

## 2024-03-10 MED ORDER — SODIUM ZIRCONIUM CYCLOSILICATE 10 G PO PACK
10.0000 g | PACK | Freq: Once | ORAL | Status: DC
  Filled 2024-03-10: qty 1

## 2024-03-10 MED ORDER — INSULIN ASPART 100 UNIT/ML IV SOLN
5.0000 [IU] | Freq: Once | INTRAVENOUS | Status: AC
  Administered 2024-03-10: 5 [IU] via INTRAVENOUS

## 2024-03-10 MED ORDER — DEXTROSE 50 % IV SOLN
1.0000 | Freq: Once | INTRAVENOUS | Status: AC
  Administered 2024-03-10: 50 mL via INTRAVENOUS
  Filled 2024-03-10: qty 50

## 2024-03-10 MED ORDER — HYDROMORPHONE HCL 1 MG/ML IJ SOLN
0.5000 mg | INTRAMUSCULAR | Status: DC | PRN
  Administered 2024-03-10 – 2024-03-16 (×38): 0.5 mg via INTRAVENOUS
  Filled 2024-03-10 (×36): qty 0.5

## 2024-03-10 NOTE — Progress Notes (Signed)
 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 Hypertensive in ED SBP 240s.  CT scan suggested colitis.  Patient's mentation continued to worsen. CT head was subsequently done which revealed intracranial hemorrhage.  Patient was admitted to the ICU.    Consultants: Critical care medicine.  Neurosurgery.  Neurology.  Nephrology  Significant Hospital Events:  6/11 ED w n/v AMS. Abx IVF for colitis 6/12 Admit. AMS decr LOC. Incr O2 req. PCCM consult and code stroke called seen w rapid response nephro NP then neuro  6/13 Intubated with EVD, responding to commands  6/14 Extubated 6/17 acute L cerebellar infarct. Keppra  dc  6/18 Dialysis, fevering, EVD at 10 (129 mL last 24 hrs) 6/19 Narcotic dependence a driving factor. +Vasospasm on TCD. CTA with evolving Left cerebellar infarct, no HT or mass effect, substantially decreased caliber of multiple intracranial arteries and bilateral Circle-of-Willis branches (especially: Distal left V4, supraclinoid ICAs, MCA and ACA origins), appearance c/w widespread Vasospasm, Vasoconstriction. No associated large vessel occlusion. 6/21 CSF sampline from EVD -- WBC 244 RBC 19000, cx ngtd  6/25: diagnostic angio today with nsgy - negative 6/26 evd leaking when clamped  6/30 evd removed, HD overnight, HTN improved, 7 days cefazolin  complete 7/1 more lethargic> minimal response to narcan  x2, oxy stopped, EEG neg, MRI - new small left parietal cva, cortrak placed postpyloric, vagal episodes with N/V 7/2 iHD, vimpat  started    Subjective/Interval History: Patient continues to complain of headache.  Otherwise noted to be fairly stable.  More interactive in the last 48 hours.  Still distracted  though.  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   Underwent placement of right frontal pressure ventriculostomy via bur hole. Ventriculostomy was removed on 6/30. Seems to be stable from neurological standpoint except for headaches.  Continue scheduled Tylenol  and as needed Fioricet .  Also noted to be on as needed morphine .  Could add as needed oxycodone . Neurosurgery has signed off.  Concern for seizure activity Seen by neurology.  Underwent EEG.  Patient was started on Vimpat .  Improvement noted.  Vasospasm with left cerebellar infarct Seen by neurology.  Noted to be on aspirin . LDL 63.  HbA1c 4.2. Patient noted to be on midodrine  for BP augmentation.  Due to persistent hypotension midodrine  has been discontinued. PT and OT following.  SNF being pursued.  Hypertension Midodrine  was discontinued yesterday but she remained persistently hypertensive.  Will see how she does with hemodialysis today but she may need antihypertensives to keep her blood pressure less than 160 systolic.  Continue as needed medications for now.  End-stage renal disease on hemodialysis/hyperkalemia History of failed transplant is present.  She is chronically immunosuppressed with CellCept  cyclosporine  and prednisone . Management per nephrology. Patient is on sodium bicarbonate . Given calcium  gluconate for elevated potassium level today due to hyperacute T waves noted on EKG.  To be dialyzed this morning.  Bradycardia Secondary to vagal response. Resolved.  Oropharyngeal dysphagia Noted to have core track feeding tube.  Dietitian following.  Last seen by speech therapy on 7/2.  Patient's mentation has improved.  Waiting on reevaluation by speech therapy.  Colitis Stable.  Status post pancreatic transplant/diabetes Monitor CBGs.  Anemia of chronic illness Stable  Moderate protein  calorie malnutrition Nutrition Problem: Moderate Malnutrition Etiology: chronic illness (ESRD on HD/DM)  DVT Prophylaxis: Subcutaneous heparin  Code Status: Full code Family Communication: No family at bedside Disposition Plan: SNF when medically improved     Medications: Scheduled:  acetaminophen   650 mg Per Tube Q4H   Or   acetaminophen   650 mg Rectal Q4H   aspirin   81 mg Per Tube Daily   calcium  gluconate  1 g Intravenous Once   cycloSPORINE   200 mg Per Tube QHS   cycloSPORINE   225 mg Per Tube Daily   darbepoetin (ARANESP ) injection - DIALYSIS  60 mcg Subcutaneous Q Fri-1800   heparin  injection (subcutaneous)  5,000 Units Subcutaneous Q8H   insulin  aspart  0-9 Units Subcutaneous Q4H   lacosamide   100 mg Per Tube BID   metoCLOPramide  (REGLAN ) injection  5 mg Intravenous Q8H   multivitamin  1 tablet Per Tube QHS   mycophenolate   500 mg Per Tube BID   predniSONE   15 mg Per Tube Q breakfast   sevelamer  carbonate  2.4 g Per Tube TID WC   sodium bicarbonate   1,300 mg Per Tube TID   sodium chloride  flush  3 mL Intravenous Q12H   sodium zirconium cyclosilicate   10 g Oral Once   Continuous:  anticoagulant sodium citrate      feeding supplement (VITAL 1.5 CAL) 1,000 mL (03/09/24 1720)   PRN:albuterol , alteplase , anticoagulant sodium citrate , artificial tears, atropine , butalbital -acetaminophen -caffeine , feeding supplement (NEPRO CARB STEADY), Gerhardt's butt cream, heparin , hydrALAZINE , iohexol , lidocaine  (PF), lidocaine -prilocaine , loperamide  HCl, morphine  injection, naLOXone  (NARCAN )  injection, ondansetron  (ZOFRAN ) IV, mouth rinse, pentafluoroprop-tetrafluoroeth, simethicone   Antibiotics: Anti-infectives (From admission, onward)    Start     Dose/Rate Route Frequency Ordered Stop   02/27/24 1200  vancomycin  (VANCOREADY) IVPB 500 mg/100 mL  Status:  Discontinued        500 mg 100 mL/hr over 60 Minutes Intravenous Every M-W-F (Hemodialysis) 02/25/24 1129 02/25/24 1436   02/26/24  2200  ceFAZolin  (ANCEF ) IVPB 1 g/50 mL premix        1 g 100 mL/hr over 30 Minutes Intravenous Every 24 hours 02/26/24 1009 03/03/24 2140   02/25/24 1530  vancomycin  (VANCOREADY) IVPB 500 mg/100 mL        500 mg 100 mL/hr over 60 Minutes Intravenous Every M-W-F (Hemodialysis) 02/25/24 1436 02/25/24 1934   02/23/24 1800  meropenem  (MERREM ) 1 g in sodium chloride  0.9 % 100 mL IVPB  Status:  Discontinued        1 g 200 mL/hr over 30 Minutes Intravenous Every 24 hours 02/23/24 1625 02/26/24 1009   02/23/24 1800  vancomycin  (VANCOREADY) IVPB 1250 mg/250 mL        1,250 mg 166.7 mL/hr over 90 Minutes Intravenous  Once 02/23/24 1625 02/23/24 1933   02/17/24 2200  piperacillin -tazobactam (ZOSYN ) IVPB 2.25 g        2.25 g 100 mL/hr over 30 Minutes Intravenous Every 8 hours 02/17/24 1412 02/21/24 2336   02/14/24 1800  vancomycin  (VANCOREADY) IVPB 2000 mg/400 mL        2,000 mg 200 mL/hr over 120 Minutes Intravenous  Once 02/14/24 1709 02/14/24 2048   02/14/24 1622  piperacillin -tazobactam (ZOSYN ) IVPB 3.375 g  Status:  Discontinued        3.375 g 100 mL/hr over 30 Minutes Intravenous Every 6 hours 02/14/24 1622 02/17/24 1412   02/14/24 1545  piperacillin -tazobactam (ZOSYN ) IVPB 3.375 g  Status:  Discontinued        3.375 g 12.5  mL/hr over 240 Minutes Intravenous Every 8 hours 02/14/24 1451 02/14/24 1453   02/14/24 1545  piperacillin -tazobactam (ZOSYN ) IVPB 2.25 g  Status:  Discontinued        2.25 g 100 mL/hr over 30 Minutes Intravenous Every 8 hours 02/14/24 1453 02/14/24 1622   02/14/24 0800  cefTRIAXone  (ROCEPHIN ) 2 g in sodium chloride  0.9 % 100 mL IVPB  Status:  Discontinued        2 g 200 mL/hr over 30 Minutes Intravenous Every 24 hours 02/14/24 0730 02/14/24 1451   02/14/24 0800  metroNIDAZOLE  (FLAGYL ) IVPB 500 mg  Status:  Discontinued        500 mg 100 mL/hr over 60 Minutes Intravenous Every 12 hours 02/14/24 0730 02/14/24 1451       Objective:  Vital Signs  Vitals:    03/10/24 0600 03/10/24 0806 03/10/24 0930 03/10/24 0948  BP: (!) 146/72 (!) 156/71 (!) 187/85 (!) 174/78  Pulse: (!) 104 (!) 101 (!) 104 (!) 101  Resp: (!) 7 13 20  (!) 23  Temp:  98.4 F (36.9 C) 97.6 F (36.4 C)   TempSrc:  Oral    SpO2: 100% 100%  100%  Weight:   56.2 kg   Height:        Intake/Output Summary (Last 24 hours) at 03/10/2024 0957 Last data filed at 03/09/2024 1023 Gross per 24 hour  Intake 106.33 ml  Output --  Net 106.33 ml   Filed Weights   03/08/24 0715 03/09/24 0442 03/10/24 0930  Weight: 55.4 kg 56 kg 56.2 kg    General appearance: Awake alert.  In no distress.  Distracted Resp: Clear to auscultation bilaterally.  Normal effort Cardio: S1-S2 is normal regular.  No S3-S4.  No rubs murmurs or bruit GI: Abdomen is soft.  Nontender nondistended.  Bowel sounds are present normal.  No masses organomegaly    Lab Results:  Data Reviewed: I have personally reviewed following labs and reports of the imaging studies  CBC: Recent Labs  Lab 03/04/24 0606 03/05/24 0459 03/06/24 0508 03/08/24 0338 03/10/24 0222  WBC 4.3 4.3 6.9 8.5 6.4  HGB 8.5* 8.8* 9.8* 9.5* 10.1*  HCT 25.2* 27.1* 29.4* 28.6* 30.7*  MCV 93.3 94.8 95.8 97.9 97.2  PLT 207 227 267 230 213    Basic Metabolic Panel: Recent Labs  Lab 03/04/24 0606 03/05/24 0459 03/06/24 0508 03/08/24 0338 03/10/24 0222  NA  --  134* 135 136 129*  K  --  4.3 3.9 4.8 6.6*  CL  --  88* 90* 92* 82*  CO2  --  21* 25 25 27   GLUCOSE  --  89 123* 113* 123*  BUN  --  37* 25* 29* 60*  CREATININE  --  4.82* 3.93* 4.04* 6.78*  CALCIUM   --  10.8* 10.8* 10.7* 10.6*  MG 2.5* 2.7* 2.6*  --   --   PHOS 4.7* 8.6* 6.5*  --   --     GFR: Estimated Creatinine Clearance: 9.6 mL/min (A) (by C-G formula based on SCr of 6.78 mg/dL (H)).  Liver Function Tests: Recent Labs  Lab 03/06/24 0508 03/08/24 0338 03/10/24 0222  AST  --  19 29  ALT  --  <5 9  ALKPHOS  --  81 109  BILITOT  --  0.2 0.7  PROT  --  6.2* 6.2*   ALBUMIN 3.0* 2.9* 2.9*    Recent Labs  Lab 03/03/24 1535  LIPASE 26   Recent Labs  Lab 03/05/24 0459  AMMONIA 18     CBG: Recent Labs  Lab 03/09/24 1505 03/09/24 1916 03/09/24 2315 03/10/24 0337 03/10/24 0806  GLUCAP 142* 131* 110* 131* 123*     Radiology Studies: No results found.      LOS: 25 days   Tacie Mccuistion  [name] Pager on [email or web address]  03/10/2024, 9:57 AM

## 2024-03-10 NOTE — Progress Notes (Signed)
 [facility] Progress Note  Subjective:  Cries out intermittently  Vitals:   03/10/24 1310 03/10/24 1315 03/10/24 1454 03/10/24 1600  BP: (!) 80/58 102/60 (!) 148/73 119/67  Pulse: (!) 114 (!) 109 (!) 108 (!) 112  Resp: 13 18 20 17   Temp:  (!) 97.5 F (36.4 C) 98.2 F (36.8 C)   TempSrc:   Axillary   SpO2: 100% 100% 100% 100%  Weight:  53.2 kg    Height:        Exam: General: Lying in bed, no distress Heart: Normal rate, murmur present, no rub Lungs: bilateral chest rise, no increased work of breathing Abdomen: soft, nontender Extremities: No lower extremity edema, warm and well-perfused  Dialysis Access:  RUE AVF +t/b   OP HD:  MWF AF 3h  B400   57.2kg  2K   AVF  Heparin  none - Hectoral 4mcg IV q HD - Mircera 150mcg IV q 2 weeks (last given 6/9 -> due 6/23)   Assessment/Plan: Intracerebral hemorrhage: C/b hydrocephalus, 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 TCDs with evidence of vasospasm 02/11/24. Followed closely by neurosurgery, evd removed 6/30 CVA: New CVA found on 7/1.  Management per primary team BP/volume: bp's stable, continue with ultrafiltration as tolerated ESRD: MWF HD. S/p CRRT 6/12-6/12, then back to iHD. Continue dialysis per schedule.  Anemia of ESRD: Hgb 8-10 here. Started darbe at 60 micrograms sq weekly. Iron  adequate Secondary HPTH: Ca and Phos high, VDRA stopped 6/21, added sevelamer  via tube. PTH 115.  Could consider low-dose Sensipar .  Continue to monitor for now because hypercalcemia likely a minimal problem at this point.  Pancreas/ kidney transplant (2014): for the pancreas, we resumed her usual dosing of the CyA at 225mg  qam and 200 mg qpm.     Myer Fret MD  [facility] 03/10/2024, 4:15 PM  Recent Labs  Lab 03/05/24 0459 03/05/24 0459 03/06/24 0508 03/08/24 0338 03/10/24 0222  HGB 8.8*  --  9.8* 9.5* 10.1*  ALBUMIN  --    < > 3.0* 2.9* 2.9*  CALCIUM  10.8*  --  10.8* 10.7* 10.6*  PHOS 8.6*  --  6.5*  --   --    CREATININE 4.82*  --  3.93* 4.04* 6.78*  K 4.3  --  3.9 4.8 6.6*   < > = values in this interval not displayed.   No results for input(s): IRON , TIBC, FERRITIN in the last 168 hours. Inpatient medications:  acetaminophen   650 mg Per Tube Q4H   Or   acetaminophen   650 mg Rectal Q4H   aspirin   81 mg Per Tube Daily   calcium  gluconate  1 g Intravenous Once   cycloSPORINE   200 mg Per Tube QHS   cycloSPORINE   225 mg Per Tube Daily   darbepoetin (ARANESP ) injection - DIALYSIS  60 mcg Subcutaneous Q Fri-1800   heparin  injection (subcutaneous)  5,000 Units Subcutaneous Q8H   insulin  aspart  0-9 Units Subcutaneous Q4H   lacosamide   100 mg Per Tube BID   metoCLOPramide  (REGLAN ) injection  5 mg Intravenous Q8H   multivitamin  1 tablet Per Tube QHS   mycophenolate   500 mg Per Tube BID   predniSONE   15 mg Per Tube Q breakfast   sevelamer  carbonate  2.4 g Per Tube TID WC   sodium bicarbonate   1,300 mg Per Tube TID   sodium chloride  flush  3 mL Intravenous Q12H   sodium zirconium cyclosilicate   10 g Oral Once    feeding supplement (VITAL  1.5 CAL) 1,000 mL (03/09/24 1720)   albuterol , artificial tears, atropine , butalbital -acetaminophen -caffeine , Gerhardt's butt cream, hydrALAZINE , iohexol , loperamide  HCl, morphine  injection, naLOXone  (NARCAN )  injection, ondansetron  (ZOFRAN ) IV, mouth rinse, simethicone 

## 2024-03-10 NOTE — TOC Progression Note (Signed)
 Transition of Care [facility]) - Progression Note    Patient Details  Name: [name] [name] MRN: [id number] Date of Birth: 1981/04/19  Transition of Care [facility]) CM/SW Contact  Inocente GORMAN Kindle, LCSW Phone Number: 03/10/2024, 2:05 PM  Clinical Narrative:    CSW submitted letter for the embassy to patients father, Nassir Lamantia ([email or web address]), signed by this CSW. MD able to sign copy of the letter tomorrow in case MD's signature is required.     Expected Discharge Plan: Skilled Nursing Facility Barriers to Discharge: Continued Medical Work up, English as a second language teacher, SNF Pending bed offer  Expected Discharge Plan and Services                                               Social Determinants of Health (SDOH) Interventions SDOH Screenings   Food Insecurity: Patient Unable To Answer (02/15/2024)  Housing: Low Risk  (03/05/2024)  Transportation Needs: Patient Unable To Answer (02/15/2024)  Utilities: Patient Unable To Answer (02/15/2024)  Social Connections: Unknown (10/04/2022)   Received from [facility]  Tobacco Use: Medium Risk (02/13/2024)    Readmission Risk Interventions    11/17/2021   12:15 PM  Readmission Risk Prevention Plan  Transportation Screening Complete  Medication Review (RN Care Manager) Complete  PCP or Specialist appointment within 3-5 days of discharge Complete  HRI or Home Care Consult Complete  SW Recovery Care/Counseling Consult Complete  Palliative Care Screening Not Applicable  Skilled Nursing Facility Patient Refused

## 2024-03-10 NOTE — Care Management Important Message (Signed)
 Important Message  Patient Details  Name: [name] [name] MRN: [id number] Date of Birth: June 01, 1981   Important Message Given:  Yes - Medicare IM     Jon Cruel 03/10/2024, 11:59 AM

## 2024-03-10 NOTE — Progress Notes (Signed)
 SLP Cancellation Note  Patient Details Name: [name] [name] MRN: [id number] DOB: February 16, 1981   Cancelled treatment:       Reason Eval/Treat Not Completed: Patient at procedure or test/unavailable (In HD. Will f/u next date.)  Rea Pass MA, CCC-SLP  Satrina Magallanes Meryl 03/10/2024, 10:16 AM

## 2024-03-10 NOTE — Progress Notes (Signed)
   03/10/24 1315  Vitals  Temp (!) 97.5 F (36.4 C)  Pulse Rate (!) 109  Resp 18  BP 102/60  SpO2 100 %  Weight 53.2 kg  Type of Weight Post-Dialysis  Post Treatment  Dialyzer Clearance Lightly streaked  Hemodialysis Intake (mL) 0 mL  Liters Processed 70.8  Fluid Removed (mL) 3000 mL  Tolerated HD Treatment Yes  AVG/AVF Arterial Site Held (minutes) 8 minutes  AVG/AVF Venous Site Held (minutes) 8 minutes   Received patient in bed to unit.  Alert and oriented.  Informed consent signed and in chart.   TX duration:3HRS  Patient tolerated well.  Transported back to the room  Alert, without acute distress.  Hand-off given to patient's nurse.   Access used:  RAVF Access issues: NONE  Total UF removed: 3L Medication(s) given: NONE   [facility]

## 2024-03-11 DIAGNOSIS — N186 End stage renal disease: Secondary | ICD-10-CM | POA: Diagnosis not present

## 2024-03-11 DIAGNOSIS — E440 Moderate protein-calorie malnutrition: Secondary | ICD-10-CM | POA: Diagnosis not present

## 2024-03-11 DIAGNOSIS — I609 Nontraumatic subarachnoid hemorrhage, unspecified: Secondary | ICD-10-CM | POA: Diagnosis not present

## 2024-03-11 DIAGNOSIS — R1312 Dysphagia, oropharyngeal phase: Secondary | ICD-10-CM | POA: Diagnosis not present

## 2024-03-11 LAB — GLUCOSE, CAPILLARY
Glucose-Capillary: 125 mg/dL — ABNORMAL HIGH (ref 70–99)
Glucose-Capillary: 113 mg/dL — ABNORMAL HIGH (ref 70–99)
Glucose-Capillary: 124 mg/dL — ABNORMAL HIGH (ref 70–99)
Glucose-Capillary: 140 mg/dL — ABNORMAL HIGH (ref 70–99)
Glucose-Capillary: 96 mg/dL (ref 70–99)
Glucose-Capillary: 96 mg/dL (ref 70–99)

## 2024-03-11 LAB — BASIC METABOLIC PANEL WITH GFR
Anion gap: 18 — ABNORMAL HIGH (ref 5–15)
BUN: 33 mg/dL — ABNORMAL HIGH (ref 6–20)
CO2: 26 mmol/L (ref 22–32)
Calcium: 10 mg/dL (ref 8.9–10.3)
Chloride: 88 mmol/L — ABNORMAL LOW (ref 98–111)
Creatinine, Ser: 4.36 mg/dL — ABNORMAL HIGH (ref 0.44–1.00)
GFR, Estimated: 12 mL/min — ABNORMAL LOW (ref >60)
Glucose, Bld: 124 mg/dL — ABNORMAL HIGH (ref 70–99)
Potassium: 5.6 mmol/L — ABNORMAL HIGH (ref 3.5–5.1)
Sodium: 132 mmol/L — ABNORMAL LOW (ref 135–145)

## 2024-03-11 MED ORDER — SODIUM ZIRCONIUM CYCLOSILICATE 10 G PO PACK
10.0000 g | PACK | Freq: Once | ORAL | Status: AC
  Administered 2024-03-11: 10 g
  Filled 2024-03-11: qty 1

## 2024-03-11 NOTE — Plan of Care (Signed)
  Problem: Clinical Measurements: Goal: Ability to maintain clinical measurements within normal limits will improve Outcome: Progressing Goal: Will remain free from infection Outcome: Progressing Goal: Diagnostic test results will improve Outcome: Progressing Goal: Respiratory complications will improve Outcome: Progressing   Problem: Activity: Goal: Risk for activity intolerance will decrease Outcome: Progressing   Problem: Nutrition: Goal: Adequate nutrition will be maintained Outcome: Progressing   Problem: Coping: Goal: Level of anxiety will decrease Outcome: Progressing   Problem: Elimination: Goal: Will not experience complications related to bowel motility Outcome: Progressing   Problem: Pain Managment: Goal: General experience of comfort will improve and/or be controlled Outcome: Progressing   Problem: Safety: Goal: Ability to remain free from injury will improve Outcome: Progressing   Problem: Skin Integrity: Goal: Risk for impaired skin integrity will decrease Outcome: Progressing   Problem: Coping: Goal: Ability to adjust to condition or change in health will improve Outcome: Progressing   Problem: Metabolic: Goal: Ability to maintain appropriate glucose levels will improve Outcome: Progressing   Problem: Nutritional: Goal: Maintenance of adequate nutrition will improve Outcome: Progressing Goal: Progress toward achieving an optimal weight will improve Outcome: Progressing   Problem: Skin Integrity: Goal: Risk for impaired skin integrity will decrease Outcome: Progressing   Problem: Education: Goal: Knowledge of secondary prevention will improve (MUST DOCUMENT ALL) Outcome: Progressing Goal: Knowledge of patient specific risk factors will improve (DELETE if not current risk factor) Outcome: Progressing   Problem: Intracerebral Hemorrhage Tissue Perfusion: Goal: Complications of Intracerebral Hemorrhage will be minimized Outcome: Progressing    Problem: Health Behavior/Discharge Planning: Goal: Goals will be collaboratively established with patient/family Outcome: Progressing   Problem: Self-Care: Goal: Ability to communicate needs accurately will improve Outcome: Progressing   Problem: Nutrition: Goal: Risk of aspiration will decrease Outcome: Progressing Goal: Dietary intake will improve Outcome: Progressing   Problem: Education: Goal: Knowledge of disease and its progression will improve Outcome: Progressing Goal: Individualized Educational Video(s) Outcome: Progressing   Problem: Fluid Volume: Goal: Compliance with measures to maintain balanced fluid volume will improve Outcome: Progressing   Problem: Health Behavior/Discharge Planning: Goal: Ability to manage health-related needs will improve Outcome: Progressing   Problem: Nutritional: Goal: Ability to make healthy dietary choices will improve Outcome: Progressing   Problem: Clinical Measurements: Goal: Complications related to the disease process, condition or treatment will be avoided or minimized Outcome: Progressing

## 2024-03-11 NOTE — Progress Notes (Signed)
 Physical Therapy Treatment Patient Details Name: [name] [name] MRN: [id number] DOB: 05/22/1981 Today's Date: 03/11/2024   History of Present Illness 43 yo female who presented to the ED 6/11 with n/v, abd pain after HD. 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 CRRT 6/13- 6/15. Cerebral angiogram 6/25 shows no intracranial aneurysms, AVM, or fistula seen to explain the patient's subarachnoid hemorrhage, diffuse mild-moderate anterior circulation vasospasm. EVD 6/12-6/30.  on 7/1, MRI shows new small left parietal cva, cortrak placed postpyloric, vagal episodes with N/V. 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    PT Comments  Pt moaning, stating her head hurts and she wants dilaudid . Pt also asking what happened to me, why is this happening?. Pt much more alert today, and mobilizing with less assist to/from EOB. Pt overall requiring moderate physical assist +1 only, once EOB pt able to balance well with and without UE support. Pt asking for ice chips, after ice chips pt with nausea, dry heaving and secretions. With this, pt with bradycardic episode with min HR 46 bpm and SBP elevation to 221 EOB. Pt immediately returned to supine and RN called to room to assess. SBP upon PT exit 179/85.     If plan is discharge home, recommend the following: Assistance with cooking/housework;Assistance with feeding;Direct supervision/assist for medications management;Direct supervision/assist for financial management;Assist for transportation;Help with stairs or ramp for entrance;Supervision due to cognitive status;A lot of help with walking and/or transfers;A lot of help with bathing/dressing/bathroom   Can travel by private vehicle        Equipment Recommendations  Wheelchair cushion (measurements PT);Wheelchair (measurements PT);Hospital bed;Hoyer lift    Recommendations for Other Services        Precautions / Restrictions Precautions Precautions: Fall Recall of Precautions/Restrictions: Impaired Precaution/Restrictions Comments: cortrak (paused during session), flexiseal Restrictions Weight Bearing Restrictions Per Provider Order: No     Mobility  Bed Mobility Overal bed mobility: Needs Assistance Bed Mobility: Rolling, Sidelying to Sit, Sit to Sidelying Rolling: Supervision Sidelying to sit: Mod assist, HOB elevated, Used rails     Sit to sidelying: Mod assist, Used rails, HOB elevated General bed mobility comments: spontaneous roll towards L, sequencing cues and physical assist for LE lowering over EOB and lift back into bed, trunk elevation/lowering.    Transfers                   General transfer comment: nt - pt with nausea, dry heaving with brady event to 46 bpm (baseline 90s-100s bpm), SBP 221    Ambulation/Gait                   Stairs             Wheelchair Mobility     Tilt Bed    Modified Rankin (Stroke Patients Only) Modified Rankin (Stroke Patients Only) Pre-Morbid Rankin Score: No symptoms Modified Rankin: Severe disability     Balance Overall balance assessment: Needs assistance Sitting-balance support: No upper extremity supported, Feet supported Sitting balance-Leahy Scale: Fair Sitting balance - Comments: can sit EOB this date without truncal support, PT challenging pt with hands in lap and scooting EOB       Standing balance comment: nt                            Communication Communication Communication: Impaired Factors Affecting Communication: Reduced clarity of speech  Cognition  Arousal: Alert Behavior During Therapy: Restless   PT - Cognitive impairments: Attention, Awareness, Problem solving, Safety/Judgement                       PT - Cognition Comments: pt A&Ox2, to self, location but not to situation or time. Pt also can't recall her birthday. Pt stating why is this  happening to me, what is wrong Following commands: Impaired Following commands impaired: Follows one step commands inconsistently    Cueing Cueing Techniques: Verbal cues, Gestural cues, Tactile cues, Visual cues  Exercises      General Comments General comments (skin integrity, edema, etc.): vss      Pertinent Vitals/Pain Pain Assessment Pain Assessment: Faces Faces Pain Scale: Hurts even more Pain Location: head Pain Descriptors / Indicators: Moaning, Discomfort Pain Intervention(s): Limited activity within patient's tolerance, Monitored during session, Patient requesting pain meds-RN notified    Home Living                          Prior Function            PT Goals (current goals can now be found in the care plan section) Acute Rehab PT Goals PT Goal Formulation: With patient Time For Goal Achievement: 03/20/24 Potential to Achieve Goals: Good Progress towards PT goals: Not progressing toward goals - comment (SBP elevation to 221, bradycardic event)    Frequency    Min 2X/week      PT Plan      Co-evaluation   Reason for Co-Treatment: For patient/therapist safety;To address functional/ADL transfers PT goals addressed during session: Balance;Mobility/safety with mobility OT goals addressed during session: ADL's and self-care      AM-PAC PT 6 Clicks Mobility   Outcome Measure  Help needed turning from your back to your side while in a flat bed without using bedrails?: A Little Help needed moving from lying on your back to sitting on the side of a flat bed without using bedrails?: A Lot Help needed moving to and from a bed to a chair (including a wheelchair)?: A Lot Help needed standing up from a chair using your arms (e.g., wheelchair or bedside chair)?: Total Help needed to walk in hospital room?: Total Help needed climbing 3-5 steps with a railing? : Total 6 Click Score: 10    End of Session Equipment Utilized During Treatment: Gait  belt Activity Tolerance: Patient limited by fatigue;Patient limited by lethargy Patient left: in bed;with call bell/phone within reach;with bed alarm set Nurse Communication: Mobility status;Other (comment) PT Visit Diagnosis: Muscle weakness (generalized) (M62.81);Other symptoms and signs involving the nervous system (R29.898) Hemiplegia - Right/Left: Left Hemiplegia - dominant/non-dominant: Non-dominant Hemiplegia - caused by: Other Nontraumatic intracranial hemorrhage     Time: 9154-9086 PT Time Calculation (min) (ACUTE ONLY): 28 min  Charges:    $Therapeutic Activity: 8-22 mins $Neuromuscular Re-education: 8-22 mins PT General Charges $$ ACUTE PT VISIT: 1 Visit                     Johana RAMAN, PT DPT Acute Rehabilitation Services Secure Chat Preferred  Office [phone]    Ajahnae Rathgeber E Stroup 03/11/2024, 10:07 AM

## 2024-03-11 NOTE — TOC Progression Note (Signed)
 Transition of Care [facility]) - Progression Note    Patient Details  Name: [name] [name] MRN: [id number] Date of Birth: Feb 28, 1981  Transition of Care [facility]) CM/SW Contact  Montie LOISE Louder, [name] Phone Number: 03/11/2024, 11:06 AM  Clinical Narrative:     TOC continuing to follow   Expected Discharge Plan: Skilled Nursing Facility Barriers to Discharge: Continued Medical Work up, English as a second language teacher, SNF Pending bed offer  Expected Discharge Plan and Services                                               Social Determinants of Health (SDOH) Interventions SDOH Screenings   Food Insecurity: Patient Unable To Answer (02/15/2024)  Housing: Low Risk  (03/05/2024)  Transportation Needs: Patient Unable To Answer (02/15/2024)  Utilities: Patient Unable To Answer (02/15/2024)  Social Connections: Unknown (10/04/2022)   Received from [facility]  Tobacco Use: Medium Risk (02/13/2024)    Readmission Risk Interventions    11/17/2021   12:15 PM  Readmission Risk Prevention Plan  Transportation Screening Complete  Medication Review (RN Care Manager) Complete  PCP or Specialist appointment within 3-5 days of discharge Complete  HRI or Home Care Consult Complete  SW Recovery Care/Counseling Consult Complete  Palliative Care Screening Not Applicable  Skilled Nursing Facility Patient Refused

## 2024-03-11 NOTE — Progress Notes (Signed)
 Subjective: The patient is alert and pleasant.  She remains a bit confused.  Objective: Vital signs in last 24 hours: Temp:  [97.5 F (36.4 C)-98.9 F (37.2 C)] 97.6 F (36.4 C) (07/08 0300) Pulse Rate:  [97-118] 110 (07/08 0300) Resp:  [7-23] 18 (07/08 0300) BP: (80-187)/(52-87) 156/87 (07/08 0300) SpO2:  [93 %-100 %] 100 % (07/08 0300) Weight:  [53.2 kg-56.2 kg] 53.2 kg (07/07 1315) Estimated body mass index is 18.1 kg/m as calculated from the following:   Height as of this encounter: 5' 7.5 (1.715 m).   Weight as of this encounter: 53.2 kg.   Intake/Output from previous day: 07/07 0701 - 07/08 0700 In: -  Out: 3000  Intake/Output this shift: No intake/output data recorded.  Physical exam the patient is alert and pleasant.  She has no she is at [facility].  Her speech is normal.  She is moving all 4 extremities well.  I reviewed the patient's brain MRI and head CT performed yesterday.  She has had infarcts.  She has persistent ventriculomegaly especially when compared with her CAT scan on 01/30/2022.  Lab Results: Recent Labs    03/10/24 0222  WBC 6.4  HGB 10.1*  HCT 30.7*  PLT 213   BMET Recent Labs    03/10/24 0222 03/10/24 1100  NA 129*  --   K 6.6* 4.4  CL 82*  --   CO2 27  --   GLUCOSE 123*  --   BUN 60*  --   CREATININE 6.78*  --   CALCIUM  10.6*  --     Studies/Results: MR BRAIN WO CONTRAST Result Date: 03/10/2024 CLINICAL DATA:  Stroke evaluation, new stroke noted on CT. EXAM: MRI HEAD WITHOUT CONTRAST TECHNIQUE: Multiplanar, multiecho pulse sequences of the brain and surrounding structures were obtained without intravenous contrast. COMPARISON:  Earlier same day CT head, MRI head 03/04/2024. FINDINGS: Brain: Redemonstrated evolving subacute infarct in the left parietal lobe with areas of petechial hemorrhage along the left parietal cortex. There is a punctate focus of acute infarct within the medial left occipital cortex. No evidence of acute  infarct within the posterior left cerebellum. There are areas of susceptibility in the left cerebellum corresponding to hemorrhage noted on CT. No significant associated edema. Chronic microvascular ischemic changes. Remote infarcts in the right corona radiata, bilateral basal ganglia, pons, and inferomedial left cerebellum. Ventricles: Removal of previously noted right frontal approach EVD catheter with encephalomalacia along the catheter tract noted. Similar size and configuration of the ventricles. Minimal gas noted within the right frontal horn. Trace blood products layering in the occipital horns which are slightly decreased. Vascular: Skull base flow voids are visualized. Skull and upper cervical spine: No focal abnormality. Sinuses/Orbits: Bilateral lens replacement.  Sinuses are clear. Other: Bilateral mastoid effusions. IMPRESSION: Small foci of hemorrhage in the posterior left cerebellum without evidence of acute infarct. Punctate focus of acute infarct involving the medial left occipital cortex. 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 Trace blood products in the occipital horns, decreased from prior. Chronic microvascular ischemic changes and remote infarcts again noted. Electronically Signed   By: Donnice Mania M.D.   On: 03/10/2024 22:35   CT HEAD WO CONTRAST ( ) Result Date: 03/10/2024 CLINICAL DATA:  Provided history: Subarachnoid hemorrhage. EXAM: CT HEAD WITHOUT CONTRAST TECHNIQUE: Contiguous axial images were obtained from the base of the skull through the vertex without intravenous contrast. RADIATION DOSE REDUCTION: This exam was performed according to the departmental dose-optimization program which includes  automated exposure control, adjustment of the mA and/or kV according to patient size and/or use of iterative reconstruction technique. COMPARISON:  Brain MRI 03/04/2024. Head CT 03/04/2024. Head CT 02/20/2024. FINDINGS: Brain: A previously demonstrated right frontal approach  EVD has been removed. Decreased pneumoventricle, now with only small-volume gas present within the right lateral ventricle frontal horn. Persistent small-volume hemorrhage layering within the occipital horns of both lateral ventricles. Ventricular dilation has not significantly changed. Known early subacute infarct within the left parietal lobe and at the left temporoparietal junction. Progressive gyriform hyperdensity within portions of this infarct, which may reflect petechial hemorrhage and/or cortical laminar necrosis. New focus of ill-defined hyperdensity within the posterior left cerebellar hemisphere suspicious for an interval infarct with petechial hemorrhage. Previously demonstrated subarachnoid hemorrhage is no longer appreciated. Redemonstrated chronic infarcts within the right corona radiata/basal ganglia, left basal ganglia, left aspect of the pons and inferomedial left cerebellar hemisphere. Mild patchy and ill-defined hypoattenuation elsewhere within the cerebral white matter, nonspecific but compatible chronic small vessel ischemic disease. No evidence of an intracranial mass. No midline shift. Vascular: No hyperdense vessel.  Atherosclerotic calcifications. Skull: Right frontal burr hole. No calvarial fracture or aggressive osseous lesion. Sinuses/Orbits: No mass or acute finding within the imaged orbits. Mild mucosal thickening within the left sphenoid sinus. Partially visualized nasoenteric tube. Other: Small-volume fluid within bilateral mastoid air cells. Impressions #3 and #4 will be called to the ordering clinician or representative by the Radiologist Assistant, and communication documented in the PACS or [employer]. IMPRESSION: 1. A previously demonstrated right frontal approach EVD has been removed. Decreased pneumoventricle. 2. Persistent small-volume hemorrhage layering within the occipital horns of both lateral ventricles. Ventricular dilation has not significantly changed. 3. Known  early subacute infarct within the left parietal lobe and at the left temporoparietal junction. Progressive gyriform hyperdensity within portions of this infarct, which may reflect petechial hemorrhage and/or cortical laminar necrosis. 4. New focus of ill-defined hyperdensity within the posterior left cerebellar hemisphere suspicious for an interval infarct with petechial hemorrhage. Consider a brain MRI for further evaluation. 5. Previously demonstrated subarachnoid hemorrhage is no longer appreciated. Electronically Signed   By: Rockey Childs D.O.   On: 03/10/2024 10:26    Assessment/Plan: Status post subarachnoid hemorrhage, ventriculomegaly, cerebral infarctions: The patient has communicating hydrocephalus.  It has been stable throughout her scans but her ventricles are much bigger than her scan in 2023.  I have recommended a lumbar puncture to measure the opening pressure and to make sure that she does not have meningitis with her prolonged intraventricular catheter.  Perhaps she would benefit from a ventriculoperitoneal shunt.  LOS: 26 days     [name] [name] 03/11/2024, 7:47 AM     Patient ID: [name] [name], female   DOB: 01/04/1981, 43 y.o.   MRN: [id number]

## 2024-03-11 NOTE — Progress Notes (Signed)
 [facility] Progress Note  Subjective:  Resting 3 L off w/ HD yest but dropped BP's into 80-90's the last 1-2 hrs 53kg post    Vitals:   03/11/24 0300 03/11/24 0705 03/11/24 0813 03/11/24 0918  BP: (!) 156/87  (!) 182/80 (!) 179/85  Pulse: (!) 110  (!) 107   Resp: 18  10   Temp: 97.6 F (36.4 C)     TempSrc: Oral     SpO2: 100%  99%   Weight:  53.5 kg    Height:        Exam: General: Lying in bed, no distress Heart: Normal rate, murmur present, no rub Lungs: bilateral chest rise, no increased work of breathing Abdomen: soft, nontender Extremities: No lower extremity edema, warm and well-perfused  Dialysis Access:  RUE AVF +t/b   OP HD:  MWF AF 3h  B400   57.2kg  2K   AVF  Heparin  none - Hectoral 4mcg IV q HD - Mircera 150mcg IV q 2 weeks (last given 6/9 -> due 6/23)   Assessment/Plan: Intracerebral hemorrhage: C/b hydrocephalus, 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 TCDs with evidence of vasospasm 02/11/24. Followed closely by neurosurgery, evd removed 6/30 CVA: New CVA found on 7/1. Per pmd.  ESRD: MWF HD. S/p CRRT 6/12-6/12, then back to iHD. Will change to TTS schedule for now due to staffing. Next HD Thursday.   HTN: bp's normal to high today. BP's dropped last 1 hr of dialysis yest.  Anemia of ESRD: Hgb 8-10 here. Started darbe at 60 micrograms sq weekly while here. Iron  adequate Secondary HPTH: Ca and Phos high, VDRA stopped 6/21, added sevelamer  via tube. PTH 115.  Could consider low-dose Sensipar .  Continue to monitor for now because hypercalcemia likely a minimal problem at this point.  Pancreas/ kidney transplant (2014): for the pancreas is on home dosing of CyA 225mg  qam and 200 mg qpm + Cellcept  and prednisone .     Myer Fret MD  [facility] 03/11/2024, 12:07 PM  Recent Labs  Lab 03/05/24 0459 03/05/24 0459 03/06/24 0508 03/08/24 0338 03/10/24 0222 03/10/24 1100 03/11/24 0643  HGB 8.8*  --  9.8* 9.5* 10.1*  --   --   ALBUMIN  --     < > 3.0* 2.9* 2.9*  --   --   CALCIUM  10.8*  --  10.8* 10.7* 10.6*  --  10.0  PHOS 8.6*  --  6.5*  --   --   --   --   CREATININE 4.82*  --  3.93* 4.04* 6.78*  --  4.36*  K 4.3  --  3.9 4.8 6.6* 4.4 5.6*   < > = values in this interval not displayed.   No results for input(s): IRON , TIBC, FERRITIN in the last 168 hours. Inpatient medications:  acetaminophen   650 mg Per Tube Q4H   Or   acetaminophen   650 mg Rectal Q4H   aspirin   81 mg Per Tube Daily   cycloSPORINE   200 mg Per Tube QHS   cycloSPORINE   225 mg Per Tube Daily   darbepoetin (ARANESP ) injection - DIALYSIS  60 mcg Subcutaneous Q Fri-1800   heparin  injection (subcutaneous)  5,000 Units Subcutaneous Q8H   insulin  aspart  0-9 Units Subcutaneous Q4H   lacosamide   100 mg Per Tube BID   metoCLOPramide  (REGLAN ) injection  5 mg Intravenous Q8H   multivitamin  1 tablet Per Tube QHS   mycophenolate   500 mg Per Tube BID   predniSONE   15 mg Per Tube Q breakfast   sevelamer  carbonate  2.4 g Per Tube TID WC   sodium bicarbonate   1,300 mg Per Tube TID   sodium chloride  flush  3 mL Intravenous Q12H    feeding supplement (VITAL 1.5 CAL) 1,000 mL (03/10/24 2100)   albuterol , artificial tears, atropine , butalbital -acetaminophen -caffeine , Gerhardt's butt cream, hydrALAZINE , HYDROmorphone  (DILAUDID ) injection, iohexol , loperamide  HCl, naLOXone  (NARCAN )  injection, ondansetron  (ZOFRAN ) IV, mouth rinse, simethicone 

## 2024-03-11 NOTE — Progress Notes (Signed)
 SLP Cancellation Note  Patient Details Name: [name] [name] MRN: [id number] DOB: October 07, 1980   Cancelled treatment:       Reason Eval/Treat Not Completed: Medical issues which prohibited therapy. Discussed with RN and MD, who report episode of emesis after being given ice chips. MD recommends holding SLP until next date. Will f/u.    Damien Blumenthal, M.A., CCC-SLP Speech Language Pathology, Acute Rehabilitation Services  Secure Chat preferred [phone]  03/11/2024, 10:14 AM

## 2024-03-11 NOTE — Progress Notes (Addendum)
 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 Hypertensive in ED SBP 240s.  CT scan suggested colitis.  Patient's mentation continued to worsen. CT head was subsequently done which revealed intracranial hemorrhage.  Patient was admitted to the ICU.    Consultants: Critical care medicine.  Neurosurgery.  Neurology.  Nephrology  Significant Hospital Events:  6/11 ED w n/v AMS. Abx IVF for colitis 6/12 Admit. AMS decr LOC. Incr O2 req. PCCM consult and code stroke called seen w rapid response nephro NP then neuro  6/13 Intubated with EVD, responding to commands  6/14 Extubated 6/17 acute L cerebellar infarct. Keppra  dc  6/18 Dialysis, fevering, EVD at 10 (129 mL last 24 hrs) 6/19 Narcotic dependence a driving factor. +Vasospasm on TCD. CTA with evolving Left cerebellar infarct, no HT or mass effect, substantially decreased caliber of multiple intracranial arteries and bilateral Circle-of-Willis branches (especially: Distal left V4, supraclinoid ICAs, MCA and ACA origins), appearance c/w widespread Vasospasm, Vasoconstriction. No associated large vessel occlusion. 6/21 CSF sampline from EVD -- WBC 244 RBC 19000, cx ngtd  6/25: diagnostic angio today with nsgy - negative 6/26 evd leaking when clamped  6/30 evd removed, HD overnight, HTN improved, 7 days cefazolin  complete 7/1 more lethargic> minimal response to narcan  x2, oxy stopped, EEG neg, MRI - new small left parietal cva, cortrak placed postpyloric, vagal episodes with N/V 7/2 iHD, vimpat  started    Subjective/Interval History: Continues to complain of headache.  Requesting pain medicines this morning.    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   Underwent placement of right frontal pressure ventriculostomy via bur hole. Ventriculostomy was removed on 6/30. Seems to be stable from neurological standpoint except for headaches.  Continue scheduled Tylenol  and as needed Fioricet .  Also noted to be on as needed morphine .  Could add as needed oxycodone . Underwent CT head yesterday as ordered by neurosurgery.  This raised concern for a new infarct.  Subsequently MRI brain was done.  This showed small foci of hemorrhage in the posterior left cerebellum without evidence for acute infarct.  Punctate focus of acute infarct involving the medial left occipital cortex was seen.  Evolving subacute infarct in the left parietal lobe seen.  Trace blood products were noted. Seen by neurosurgery this morning and they are concerned about the hydrocephalus.  They have ordered LP to measure opening pressure.  Concern for seizure activity Seen by neurology.  Underwent EEG.  Patient was started on Vimpat .  Improvement noted.  Vasospasm with left cerebellar infarct Seen by neurology.  Noted to be on aspirin . LDL 63.  HbA1c 4.2. Patient noted to be on midodrine  for BP augmentation.  Due to persistent hypotension midodrine  has been discontinued. PT and OT following.  SNF being pursued. A small punctate focus of acute infarct was noted in the medial left occipital cortex.  Per neurosurgery there is nothing to be done for that.  Continue current management.  Will run this by neurology as well.  Hypertension Patient was noted to be hypotensive.  Midodrine  was discontinued.  Blood pressure did drop with hemodialysis but noted to be better this morning.  Pain could be contributing to high readings.  She seems to  have quite brittle blood pressure.  Would like to avoid antihypertensives as much as possible.  Continue as needed medications for now.   End-stage  renal disease on hemodialysis/hyperkalemia History of failed transplant is present.  She is chronically immunosuppressed with CellCept  cyclosporine  and prednisone . Management per nephrology. Patient is on sodium bicarbonate . Potassium level improved with hemodialysis.  Noted to be again elevated this morning.  Will give her Lokelma .  Bradycardia Secondary to vagal response. Resolved.  Oropharyngeal dysphagia Noted to have core track feeding tube.  Dietitian following.  Last seen by speech therapy on 7/2.  Patient's mentation has improved.  Waiting on reevaluation by speech therapy.  Colitis Stable.  Status post pancreatic transplant/diabetes Monitor CBGs.  Anemia of chronic illness Stable  Moderate protein calorie malnutrition Nutrition Problem: Moderate Malnutrition Etiology: chronic illness (ESRD on HD/DM)  DVT Prophylaxis: Subcutaneous heparin  Code Status: Full code Family Communication: No family at bedside Disposition Plan: SNF when medically improved     Medications: Scheduled:  acetaminophen   650 mg Per Tube Q4H   Or   acetaminophen   650 mg Rectal Q4H   aspirin   81 mg Per Tube Daily   cycloSPORINE   200 mg Per Tube QHS   cycloSPORINE   225 mg Per Tube Daily   darbepoetin (ARANESP ) injection - DIALYSIS  60 mcg Subcutaneous Q Fri-1800   heparin  injection (subcutaneous)  5,000 Units Subcutaneous Q8H   insulin  aspart  0-9 Units Subcutaneous Q4H   lacosamide   100 mg Per Tube BID   metoCLOPramide  (REGLAN ) injection  5 mg Intravenous Q8H   multivitamin  1 tablet Per Tube QHS   mycophenolate   500 mg Per Tube BID   predniSONE   15 mg Per Tube Q breakfast   sevelamer  carbonate  2.4 g Per Tube TID WC   sodium bicarbonate   1,300 mg Per Tube TID   sodium chloride  flush  3 mL Intravenous Q12H   sodium zirconium cyclosilicate   10 g Oral Once   Continuous:  feeding supplement (VITAL 1.5 CAL) 1,000 mL (03/10/24 2100)   PRN:albuterol , artificial tears, atropine ,  butalbital -acetaminophen -caffeine , Gerhardt's butt cream, hydrALAZINE , HYDROmorphone  (DILAUDID ) injection, iohexol , loperamide  HCl, naLOXone  (NARCAN )  injection, ondansetron  (ZOFRAN ) IV, mouth rinse, simethicone   Antibiotics: Anti-infectives (From admission, onward)    Start     Dose/Rate Route Frequency Ordered Stop   02/27/24 1200  vancomycin  (VANCOREADY) IVPB 500 mg/100 mL  Status:  Discontinued        500 mg 100 mL/hr over 60 Minutes Intravenous Every M-W-F (Hemodialysis) 02/25/24 1129 02/25/24 1436   02/26/24 2200  ceFAZolin  (ANCEF ) IVPB 1 g/50 mL premix        1 g 100 mL/hr over 30 Minutes Intravenous Every 24 hours 02/26/24 1009 03/03/24 2140   02/25/24 1530  vancomycin  (VANCOREADY) IVPB 500 mg/100 mL        500 mg 100 mL/hr over 60 Minutes Intravenous Every M-W-F (Hemodialysis) 02/25/24 1436 02/25/24 1934   02/23/24 1800  meropenem  (MERREM ) 1 g in sodium chloride  0.9 % 100 mL IVPB  Status:  Discontinued        1 g 200 mL/hr over 30 Minutes Intravenous Every 24 hours 02/23/24 1625 02/26/24 1009   02/23/24 1800  vancomycin  (VANCOREADY) IVPB 1250 mg/250 mL        1,250 mg 166.7 mL/hr over 90 Minutes Intravenous  Once 02/23/24 1625 02/23/24 1933   02/17/24 2200  piperacillin -tazobactam (ZOSYN ) IVPB 2.25 g        2.25 g 100 mL/hr over 30 Minutes Intravenous  Every 8 hours 02/17/24 1412 02/21/24 2336   02/14/24 1800  vancomycin  (VANCOREADY) IVPB 2000 mg/400 mL        2,000 mg 200 mL/hr over 120 Minutes Intravenous  Once 02/14/24 1709 02/14/24 2048   02/14/24 1622  piperacillin -tazobactam (ZOSYN ) IVPB 3.375 g  Status:  Discontinued        3.375 g 100 mL/hr over 30 Minutes Intravenous Every 6 hours 02/14/24 1622 02/17/24 1412   02/14/24 1545  piperacillin -tazobactam (ZOSYN ) IVPB 3.375 g  Status:  Discontinued        3.375 g 12.5 mL/hr over 240 Minutes Intravenous Every 8 hours 02/14/24 1451 02/14/24 1453   02/14/24 1545  piperacillin -tazobactam (ZOSYN ) IVPB 2.25 g  Status:   Discontinued        2.25 g 100 mL/hr over 30 Minutes Intravenous Every 8 hours 02/14/24 1453 02/14/24 1622   02/14/24 0800  cefTRIAXone  (ROCEPHIN ) 2 g in sodium chloride  0.9 % 100 mL IVPB  Status:  Discontinued        2 g 200 mL/hr over 30 Minutes Intravenous Every 24 hours 02/14/24 0730 02/14/24 1451   02/14/24 0800  metroNIDAZOLE  (FLAGYL ) IVPB 500 mg  Status:  Discontinued        500 mg 100 mL/hr over 60 Minutes Intravenous Every 12 hours 02/14/24 0730 02/14/24 1451       Objective:  Vital Signs  Vitals:   03/11/24 0300 03/11/24 0705 03/11/24 0813 03/11/24 0918  BP: (!) 156/87  (!) 182/80 (!) 179/85  Pulse: (!) 110  (!) 107   Resp: 18  10   Temp: 97.6 F (36.4 C)     TempSrc: Oral     SpO2: 100%  99%   Weight:  53.5 kg    Height:        Intake/Output Summary (Last 24 hours) at 03/11/2024 0924 Last data filed at 03/11/2024 0815 Gross per 24 hour  Intake 2293.33 ml  Output 3000 ml  Net -706.67 ml   Filed Weights   03/10/24 0930 03/10/24 1315 03/11/24 0705  Weight: 56.2 kg 53.2 kg 53.5 kg    General appearance: Awake alert.  In no distress.  Distracted Core track is noted. Resp: Clear to auscultation bilaterally.  Normal effort Cardio: S1-S2 is normal regular.  No S3-S4.  No rubs murmurs or bruit GI: Abdomen is soft.  Nontender nondistended.  Bowel sounds are present normal.  No masses organomegaly    Lab Results:  Data Reviewed: I have personally reviewed following labs and reports of the imaging studies  CBC: Recent Labs  Lab 03/05/24 0459 03/06/24 0508 03/08/24 0338 03/10/24 0222  WBC 4.3 6.9 8.5 6.4  HGB 8.8* 9.8* 9.5* 10.1*  HCT 27.1* 29.4* 28.6* 30.7*  MCV 94.8 95.8 97.9 97.2  PLT 227 267 230 213    Basic Metabolic Panel: Recent Labs  Lab 03/05/24 0459 03/06/24 0508 03/08/24 0338 03/10/24 0222 03/10/24 1100 03/11/24 0643  NA 134* 135 136 129*  --  132*  K 4.3 3.9 4.8 6.6* 4.4 5.6*  CL 88* 90* 92* 82*  --  88*  CO2 21* 25 25 27   --  26   GLUCOSE 89 123* 113* 123*  --  124*  BUN 37* 25* 29* 60*  --  33*  CREATININE 4.82* 3.93* 4.04* 6.78*  --  4.36*  CALCIUM  10.8* 10.8* 10.7* 10.6*  --  10.0  MG 2.7* 2.6*  --   --   --   --   PHOS 8.6* 6.5*  --   --   --   --  GFR: Estimated Creatinine Clearance: 14.2 mL/min (A) (by C-G formula based on SCr of 4.36 mg/dL (H)).  Liver Function Tests: Recent Labs  Lab 03/06/24 0508 03/08/24 0338 03/10/24 0222  AST  --  19 29  ALT  --  <5 9  ALKPHOS  --  81 109  BILITOT  --  0.2 0.7  PROT  --  6.2* 6.2*  ALBUMIN 3.0* 2.9* 2.9*    Recent Labs  Lab 03/05/24 0459  AMMONIA 18     CBG: Recent Labs  Lab 03/10/24 1658 03/10/24 2007 03/10/24 2315 03/11/24 0340 03/11/24 0812  GLUCAP 134* 118* 130* 125* 113*     Radiology Studies: MR BRAIN WO CONTRAST Result Date: 03/10/2024 CLINICAL DATA:  Stroke evaluation, new stroke noted on CT. EXAM: MRI HEAD WITHOUT CONTRAST TECHNIQUE: Multiplanar, multiecho pulse sequences of the brain and surrounding structures were obtained without intravenous contrast. COMPARISON:  Earlier same day CT head, MRI head 03/04/2024. FINDINGS: Brain: Redemonstrated evolving subacute infarct in the left parietal lobe with areas of petechial hemorrhage along the left parietal cortex. There is a punctate focus of acute infarct within the medial left occipital cortex. No evidence of acute infarct within the posterior left cerebellum. There are areas of susceptibility in the left cerebellum corresponding to hemorrhage noted on CT. No significant associated edema. Chronic microvascular ischemic changes. Remote infarcts in the right corona radiata, bilateral basal ganglia, pons, and inferomedial left cerebellum. Ventricles: Removal of previously noted right frontal approach EVD catheter with encephalomalacia along the catheter tract noted. Similar size and configuration of the ventricles. Minimal gas noted within the right frontal horn. Trace blood products layering  in the occipital horns which are slightly decreased. Vascular: Skull base flow voids are visualized. Skull and upper cervical spine: No focal abnormality. Sinuses/Orbits: Bilateral lens replacement.  Sinuses are clear. Other: Bilateral mastoid effusions. IMPRESSION: Small foci of hemorrhage in the posterior left cerebellum without evidence of acute infarct. Punctate focus of acute infarct involving the medial left occipital cortex. 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 Trace blood products in the occipital horns, decreased from prior. Chronic microvascular ischemic changes and remote infarcts again noted. Electronically Signed   By: Donnice Mania M.D.   On: 03/10/2024 22:35   CT HEAD WO CONTRAST ( ) Result Date: 03/10/2024 CLINICAL DATA:  Provided history: Subarachnoid hemorrhage. EXAM: CT HEAD WITHOUT CONTRAST TECHNIQUE: Contiguous axial images were obtained from the base of the skull through the vertex without intravenous contrast. RADIATION DOSE REDUCTION: This exam was performed according to the departmental dose-optimization program which includes automated exposure control, adjustment of the mA and/or kV according to patient size and/or use of iterative reconstruction technique. COMPARISON:  Brain MRI 03/04/2024. Head CT 03/04/2024. Head CT 02/20/2024. FINDINGS: Brain: A previously demonstrated right frontal approach EVD has been removed. Decreased pneumoventricle, now with only small-volume gas present within the right lateral ventricle frontal horn. Persistent small-volume hemorrhage layering within the occipital horns of both lateral ventricles. Ventricular dilation has not significantly changed. Known early subacute infarct within the left parietal lobe and at the left temporoparietal junction. Progressive gyriform hyperdensity within portions of this infarct, which may reflect petechial hemorrhage and/or cortical laminar necrosis. New focus of ill-defined hyperdensity within the  posterior left cerebellar hemisphere suspicious for an interval infarct with petechial hemorrhage. Previously demonstrated subarachnoid hemorrhage is no longer appreciated. Redemonstrated chronic infarcts within the right corona radiata/basal ganglia, left basal ganglia, left aspect of the pons and inferomedial left cerebellar hemisphere. Mild patchy and  ill-defined hypoattenuation elsewhere within the cerebral white matter, nonspecific but compatible chronic small vessel ischemic disease. No evidence of an intracranial mass. No midline shift. Vascular: No hyperdense vessel.  Atherosclerotic calcifications. Skull: Right frontal burr hole. No calvarial fracture or aggressive osseous lesion. Sinuses/Orbits: No mass or acute finding within the imaged orbits. Mild mucosal thickening within the left sphenoid sinus. Partially visualized nasoenteric tube. Other: Small-volume fluid within bilateral mastoid air cells. Impressions #3 and #4 will be called to the ordering clinician or representative by the Radiologist Assistant, and communication documented in the PACS or [employer]. IMPRESSION: 1. A previously demonstrated right frontal approach EVD has been removed. Decreased pneumoventricle. 2. Persistent small-volume hemorrhage layering within the occipital horns of both lateral ventricles. Ventricular dilation has not significantly changed. 3. Known early subacute infarct within the left parietal lobe and at the left temporoparietal junction. Progressive gyriform hyperdensity within portions of this infarct, which may reflect petechial hemorrhage and/or cortical laminar necrosis. 4. New focus of ill-defined hyperdensity within the posterior left cerebellar hemisphere suspicious for an interval infarct with petechial hemorrhage. Consider a brain MRI for further evaluation. 5. Previously demonstrated subarachnoid hemorrhage is no longer appreciated. Electronically Signed   By: Rockey Childs D.O.   On: 03/10/2024 10:26         LOS: 26 days   [name] [name]  [name] Pager on [email or web address]  03/11/2024, 9:24 AM

## 2024-03-11 NOTE — Progress Notes (Signed)
 OT Cancellation Note  Patient Details Name: [name] [name] MRN: [id number] DOB: 1981-07-07   Cancelled Treatment:    Reason Eval/Treat Not Completed: Medical issues which prohibited therapy (pt bradycardic and hypertensive with PT, will hold at this time and follow up for OT as appropriate.)  Kennesha Brewbaker K, OTD, OTR/L [facility] [phone]   Laneta POUR Koonce 03/11/2024, 9:54 AM

## 2024-03-12 ENCOUNTER — Inpatient Hospital Stay (HOSPITAL_COMMUNITY)

## 2024-03-12 DIAGNOSIS — Z8639 Personal history of other endocrine, nutritional and metabolic disease: Secondary | ICD-10-CM | POA: Diagnosis not present

## 2024-03-12 DIAGNOSIS — K529 Noninfective gastroenteritis and colitis, unspecified: Secondary | ICD-10-CM | POA: Diagnosis not present

## 2024-03-12 DIAGNOSIS — I609 Nontraumatic subarachnoid hemorrhage, unspecified: Secondary | ICD-10-CM | POA: Diagnosis not present

## 2024-03-12 DIAGNOSIS — Z9489 Other transplanted organ and tissue status: Secondary | ICD-10-CM | POA: Diagnosis not present

## 2024-03-12 LAB — CBC
HCT: 27.6 % — ABNORMAL LOW (ref 36.0–46.0)
Hemoglobin: 9.1 g/dL — ABNORMAL LOW (ref 12.0–15.0)
MCH: 31.6 pg (ref 26.0–34.0)
MCHC: 33 g/dL (ref 30.0–36.0)
MCV: 95.8 fL (ref 80.0–100.0)
Platelets: 169 K/uL (ref 150–400)
RBC: 2.88 MIL/uL — ABNORMAL LOW (ref 3.87–5.11)
RDW: 15.3 % (ref 11.5–15.5)
WBC: 4.3 K/uL (ref 4.0–10.5)
nRBC: 0 % (ref 0.0–0.2)

## 2024-03-12 LAB — GLUCOSE, CAPILLARY
Glucose-Capillary: 117 mg/dL — ABNORMAL HIGH (ref 70–99)
Glucose-Capillary: 123 mg/dL — ABNORMAL HIGH (ref 70–99)
Glucose-Capillary: 122 mg/dL — ABNORMAL HIGH (ref 70–99)
Glucose-Capillary: 100 mg/dL — ABNORMAL HIGH (ref 70–99)
Glucose-Capillary: 108 mg/dL — ABNORMAL HIGH (ref 70–99)

## 2024-03-12 LAB — BASIC METABOLIC PANEL WITH GFR
Anion gap: 15 (ref 5–15)
BUN: 45 mg/dL — ABNORMAL HIGH (ref 6–20)
CO2: 29 mmol/L (ref 22–32)
Calcium: 9.7 mg/dL (ref 8.9–10.3)
Chloride: 83 mmol/L — ABNORMAL LOW (ref 98–111)
Creatinine, Ser: 5.63 mg/dL — ABNORMAL HIGH (ref 0.44–1.00)
GFR, Estimated: 9 mL/min — ABNORMAL LOW (ref >60)
Glucose, Bld: 107 mg/dL — ABNORMAL HIGH (ref 70–99)
Potassium: 5.8 mmol/L — ABNORMAL HIGH (ref 3.5–5.1)
Sodium: 127 mmol/L — ABNORMAL LOW (ref 135–145)

## 2024-03-12 MED ORDER — NEPRO/CARBSTEADY PO LIQD: 1000.0000 mL | ORAL | Status: DC

## 2024-03-12 MED ORDER — CHLORHEXIDINE GLUCONATE CLOTH 2 % EX PADS
6.0000 | MEDICATED_PAD | Freq: Every day | CUTANEOUS | Status: DC
  Administered 2024-03-12 – 2024-03-16 (×5): 6 via TOPICAL

## 2024-03-12 MED ORDER — HYDROMORPHONE HCL 1 MG/ML IJ SOLN
INTRAMUSCULAR | Status: AC
  Filled 2024-03-12: qty 0.5

## 2024-03-12 MED ORDER — HEPARIN SODIUM (PORCINE) 5000 UNIT/ML IJ SOLN
5000.0000 [IU] | Freq: Three times a day (TID) | INTRAMUSCULAR | Status: AC
  Administered 2024-03-12: 5000 [IU] via SUBCUTANEOUS
  Filled 2024-03-12: qty 1

## 2024-03-12 MED ORDER — NEPRO/CARBSTEADY PO LIQD: 237.0000 mL | Freq: Two times a day (BID) | ORAL | Status: DC

## 2024-03-12 MED ORDER — HEPARIN SODIUM (PORCINE) 5000 UNIT/ML IJ SOLN: 5000.0000 [IU] | Freq: Three times a day (TID) | INTRAMUSCULAR | Status: DC

## 2024-03-12 MED ORDER — SODIUM ZIRCONIUM CYCLOSILICATE 10 G PO PACK
10.0000 g | PACK | Freq: Once | ORAL | Status: AC
  Administered 2024-03-12: 10 g
  Filled 2024-03-12: qty 1

## 2024-03-12 MED ORDER — LABETALOL HCL 5 MG/ML IV SOLN
10.0000 mg | INTRAVENOUS | Status: DC | PRN
  Administered 2024-03-12 – 2024-03-28 (×16): 10 mg via INTRAVENOUS
  Filled 2024-03-12 (×18): qty 4

## 2024-03-12 MED ORDER — NEPRO/CARBSTEADY PO LIQD
1000.0000 mL | ORAL | Status: DC
  Filled 2024-03-12: qty 1000

## 2024-03-12 MED ORDER — HEPARIN SODIUM (PORCINE) 5000 UNIT/ML IJ SOLN
5000.0000 [IU] | Freq: Three times a day (TID) | INTRAMUSCULAR | Status: DC
  Administered 2024-03-14 – 2024-03-31 (×46): 5000 [IU] via SUBCUTANEOUS
  Filled 2024-03-12 (×49): qty 1

## 2024-03-12 NOTE — TOC Progression Note (Signed)
 Transition of Care [facility]) - Progression Note    Patient Details  Name: [name] [name] MRN: [id number] Date of Birth: 1981-08-12  [facility]) CM/SW Contact  Inocente GORMAN Kindle, LCSW Phone Number: 03/12/2024, 4:32 PM  Clinical Narrative:    CSW submitted MD signed copy of [employer] letter to patient's father, [name] [name] (phone number: [phone], email: [email or web address]). He is currently in [place], [place] attempting to obtain a Visa there.     Expected Discharge Plan: Skilled Nursing Facility Barriers to Discharge: Continued Medical Work up, English as a second language teacher, SNF Pending bed offer  Expected Discharge Plan and Services                                               Social Determinants of Health (SDOH) Interventions SDOH Screenings   Food Insecurity: Patient Unable To Answer (02/15/2024)  Housing: Low Risk  (03/05/2024)  Transportation Needs: Patient Unable To Answer (02/15/2024)  Utilities: Patient Unable To Answer (02/15/2024)  Social Connections: Unknown (10/04/2022)   Received from [facility]  Tobacco Use: Medium Risk (02/13/2024)    Readmission Risk Interventions    11/17/2021   12:15 PM  Readmission Risk Prevention Plan  Transportation Screening Complete  Medication Review (RN Care Manager) Complete  PCP or Specialist appointment within 3-5 days of discharge Complete  HRI or Home Care Consult Complete  SW Recovery Care/Counseling Consult Complete  Palliative Care Screening Not Applicable  Skilled Nursing Facility Patient Refused

## 2024-03-12 NOTE — TOC Progression Note (Signed)
 Transition of Care [facility]) - Progression Note    Patient Details  Name: [name] [name] MRN: [id number] Date of Birth: Jan 02, 1981  Transition [facility]) CM/SW Contact  Montie LOISE Louder, [name] Phone Number: 03/12/2024, 4:37 PM  Clinical Narrative:     Patient's parent contact information:  patient's father, Professor Naseer Stevison [name] (phone number: [phone], email: [email or web address]).   Expected Discharge Plan: Skilled Nursing Facility Barriers to Discharge: Continued Medical Work up, English as a second language teacher, SNF Pending bed offer  Expected Discharge Plan and Services                                               Social Determinants of Health (SDOH) Interventions SDOH Screenings   Food Insecurity: Patient Unable To Answer (02/15/2024)  Housing: Low Risk  (03/05/2024)  Transportation Needs: Patient Unable To Answer (02/15/2024)  Utilities: Patient Unable To Answer (02/15/2024)  Social Connections: Unknown (10/04/2022)   Received from [facility]  Tobacco Use: Medium Risk (02/13/2024)    Readmission Risk Interventions    11/17/2021   12:15 PM  Readmission Risk Prevention Plan  Transportation Screening Complete  Medication Review (RN Care Manager) Complete  PCP or Specialist appointment within 3-5 days of discharge Complete  HRI or Home Care Consult Complete  SW Recovery Care/Counseling Consult Complete  Palliative Care Screening Not Applicable  Skilled Nursing Facility Patient Refused

## 2024-03-12 NOTE — Progress Notes (Signed)
 Nutrition Follow-up  DOCUMENTATION CODES:   Non-severe (moderate) malnutrition in context of chronic illness  INTERVENTION:   Transition to nocturnal tube feeding via Cortrak feeding tube: 1700-0700 Nepro at 70 ml/h x 14 hours (980 ml per day)   Provides 1735 kcal, 79 gm protein, 713 ml free water  daily  Nepro Shake po BID, each supplement provides 425 kcal and 19 grams protein Rena-Vite daily per tube Recommend advancement of cortrak feeding tube to post pyloric to avoid emesis.  If pt cannot tolerate a FLD, recommend looking into a J-tube with vented G-tube in the future.  NUTRITION DIAGNOSIS:   Moderate Malnutrition related to chronic illness (ESRD on HD/DM) as evidenced by mild fat depletion, mild muscle depletion. 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  6/11 - ED for abx for colitis 6/12 - admit for ICH; intubated with EVD; started CRRT 6/13 - s/p cortrak placement; tip gastric 6/14 - extubated  6/15 - CRRT stopped 6/16 - began iHD treatment regimen MWF 6/27- SLP therapy, allowing floor snacks of thin liquids/purees but remained NPO 6/28- recorded emesis episode, tube feeds stopped 6/30 -EVD removed. HD overnight  7/1 - s/p post pyloric cortrak placement, New CVA  7/2 - Trickle tube feeds resumed, HD AM  7/3 - Switched to VITAL 1.5, titrate tube feeds to goal  7/9 - Swicthed to NEPRO d/t elevated k, cortrak coiled in stomach, emesis   Pt still with cortrak, seems to have been tolerating post pyloric tube feeds of Vital 1.5 until yesterday where pt had an episdoe  of emesis. Pt had another epsidoe of emesis this morning that looked like bile and not tube feeds. RD looked at cortrak tube marking and tube was at 80 cm, 4 cm less then where it is supposed to be. IR previously left at 84 cm. X-ray ordered and cortrak now coiled in the stomach, which may be causing increased emesis. SLP advanced diet to full liquids however, suspect pt will not be able to tolerate much PO due to her gastroparesis and hx of vomiting. RD recommends cortrak be advanced by IR to post pyloric as multiple RD's have attempted post pyloric placement with no success. Discussed with MD.   Pt also transitioned to back to Nepro today d/t elevated potassium labs. Nepro is not ideal with pt's with gastroparesis due to it's high fat and fiber content especially if feeding tube is gastric. Pt did not tolerate NEPRO before and was having vomiting and large amount of stool via FMS. RD will add Nepro PO and access tolerance.   Pt has also had a cortrak feeding tube for 28 days now. Will transition to nocturnal tube feeds to see if pt increases PO intake. If pt cannot tolerate a FLD, recommend looking into a J-tube with vented G-tube in the future.   Admit weight: 59.3 kg Current weight: 53.5 kg    Intake/Output Summary (Last 24 hours) at 03/12/2024 1424 Last data filed at 03/11/2024 1702 Gross per 24 hour  Intake 439.17 ml  Output --  Net 439.17 ml  Emesis: 7/8 x  1, 7/9 x 1  Drains/Lines: FMS no output charted  No UOP  Nutritionally Relevant Medications: Scheduled Meds:  acetaminophen   650 mg Per Tube Q4H   Or   acetaminophen   650 mg Rectal Q4H   aspirin   81 mg Per Tube Daily   Chlorhexidine  Gluconate Cloth  6 each Topical Q0600   cycloSPORINE   200 mg Per Tube QHS   cycloSPORINE   225 mg Per Tube Daily   darbepoetin (ARANESP ) injection - DIALYSIS  60 mcg Subcutaneous Q Fri-1800   [START ON 03/13/2024] feeding supplement (NEPRO CARB STEADY)  1,000 mL Per Tube Q24H   feeding supplement  (NEPRO CARB STEADY)  237 mL Oral BID BM   heparin  injection (subcutaneous)  5,000 Units Subcutaneous Q8H   insulin  aspart  0-9 Units Subcutaneous Q4H   lacosamide   100 mg Per Tube BID   metoCLOPramide  (REGLAN ) injection  5 mg Intravenous Q8H   multivitamin  1 tablet Per Tube QHS   mycophenolate   500 mg Per Tube BID   predniSONE   15 mg Per Tube Q breakfast   sevelamer  carbonate  2.4 g Per Tube TID WC   sodium bicarbonate   1,300 mg Per Tube TID   sodium chloride  flush  3 mL Intravenous Q12H     Labs Reviewed: Sodium 127 Potassium 5.8 BUN 45 Creatinine 5.63 No recent phos or Mag Total protein 6.2 GFR 9 TG 303 CBG ranges from 96-140 mg/dL over the last 24 hours HgbA1c 4.2  Diet Order:   Diet Order             Diet full liquid Room service appropriate? Yes with Assist; Fluid consistency: Thin  Diet effective now                   EDUCATION NEEDS:   No education needs have been identified at this time  Skin:  Skin Assessment: Reviewed RN Assessment  Last BM:  7/8 no output charted via FMS  Height:   Ht Readings from Last 1 Encounters:  02/14/24 5' 7.5 (1.715 m)    Weight:   Wt Readings from Last 1 Encounters:  03/12/24 55.7 kg    Ideal Body Weight:  61.4 kg  BMI:  Body mass index is 18.95 kg/m.  Estimated Nutritional Needs:   Kcal:  1800-2000  Protein:  75-90 grams  Fluid:  1L + UOP   Olivia Kenning, RD Registered Dietitian  See Amion for more information

## 2024-03-12 NOTE — Progress Notes (Signed)
 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 Hypertensive in ED SBP 240s.  CT scan suggested colitis.  Patient's mentation continued to worsen. CT head was subsequently done which revealed intracranial hemorrhage.  Patient was admitted to the ICU.     Consultants: Critical care medicine.  Neurosurgery.  Neurology.  Nephrology   Significant Hospital Events:  6/11 ED w n/v AMS. Abx IVF for colitis 6/12 Admit. AMS decr LOC. Incr O2 req. PCCM consult and code stroke called seen w rapid response nephro NP then neuro  6/13 Intubated with EVD, responding to commands  6/14 Extubated 6/17 acute L cerebellar infarct. Keppra  dc  6/18 Dialysis, fevering, EVD at 10 (129 mL last 24 hrs) 6/19 Narcotic dependence a driving factor. +Vasospasm on TCD. CTA with evolving Left cerebellar infarct, no HT or mass effect, substantially decreased caliber of multiple intracranial arteries and bilateral Circle-of[place] branches (especially: Distal left V4, supraclinoid ICAs, MCA and ACA origins), appearance c/w widespread Vasospasm, Vasoconstriction. No associated large vessel occlusion. 6/21 CSF sampline from EVD -- WBC 244 RBC 19000, cx ngtd  6/25: diagnostic angio today with nsgy - negative 6/26 evd leaking when clamped  6/30 evd removed, HD overnight, HTN improved, 7 days cefazolin  complete 7/1 more lethargic> minimal response to narcan  x2, oxy stopped, EEG neg, MRI - new small left parietal cva, cortrak placed postpyloric, vagal episodes with N/V 7/2 iHD, vimpat  started 7/9 she appears agitated.  IVs were removed.  LP to be initiated to evaluate her opening pressure and to ensure that she does not have meningitis if this is still pending to be done.    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   -Underwent placement of right frontal pressure ventriculostomy via bur hole. -Ventriculostomy was removed on 6/30. -Seems to be stable from neurological standpoint except for headaches.  Continue scheduled Tylenol  and as needed Fioricet .  Also noted to be on as needed morphine .  Could add as needed oxycodone . -Underwent CT head the day before yesterday as ordered by neurosurgery.  This raised concern for a new infarct.  Subsequently MRI brain was done.  This showed small foci of hemorrhage in the posterior left cerebellum without evidence for acute infarct.  Punctate focus of acute infarct involving the medial left occipital cortex was seen.  Evolving subacute infarct in the left parietal lobe seen.  Trace blood products were noted. -Seen by neurosurgery and they are concerned about the hydrocephalus.  They have ordered LP to measure opening pressure and to ensure that she does not have meningitis with her prolonged intraventricular catheter..  IR was consulted to do this but the IR team advised that typically image guided LP is reserved for those who can and have failed attempts on the floor.  Inpatient team to obtain consent to do a LP. -The neurosurgical team feels that she would perhaps benefit from a ventriculoperitoneal shunt   Concern for seizure activity: Seen by neurology.  Underwent EEG.  Patient was started on Vimpat .  Improvement noted.   Vasospasm with left cerebellar infarct: Seen by neurology.  Noted to be on aspirin . LDL 63.  HbA1c 4.2. Patient noted to be on midodrine  for BP augmentation but  now due persistent hypotension midodrine  has been discontinued. PT and OT following.  SNF being pursued. -A small punctate focus of acute infarct was noted in the medial left occipital cortex.  Per neurosurgery there is nothing  to be done for that.  Continue current management.  Will run this by neurology as well.   Essential Hypertension: Midodrine  was discontinued.  Blood pressure did drop with hemodialysis but now elevated.  Pain could be contributing to high readings.  She seems to have quite brittle blood pressure.  Would like to avoid antihypertensives as much as possible.  Continue as needed medications for now.    End-stage renal disease on hemodialysis/Hyperkalemia: History of failed transplant is present.  She is chronically immunosuppressed with CellCept , Cyclosporine , and Prednisone  BUN/Cr Trend: Recent Labs  Lab 03/03/24 0706 03/05/24 0459 03/06/24 0508 03/08/24 0338 03/10/24 0222 03/11/24 0643 03/12/24 0512  BUN 57* 37* 25* 29* 60* 33* 45*  CREATININE 6.10* 4.82* 3.93* 4.04* 6.78* 4.36* 5.63*  Management per nephrology. Patient is on sodium bicarbonate . K+ Trend:  Recent Labs  Lab 03/05/24 0459 03/06/24 0508 03/08/24 0338 03/10/24 0222 03/10/24 1100 03/11/24 0643 03/12/24 0512  K 4.3 3.9 4.8 6.6* 4.4 5.6* 5.8*  -Potassium level improved with hemodialysis.  Noted to be again elevated this morning.  Will give her Lokelma .  Nephrology is now changing her tube feedings to Nepro and are planning hemodialysis today given her elevated potassium -Avoid Nephrotoxic Medications, Contrast Dyes, Hypotension and Dehydration to Ensure Adequate Renal Perfusion and will need to Renally Adjust Meds -Continue to Monitor and Trend Renal Function carefully and repeat CMP in the AM   Bradycardia: Secondary to vagal response. Resolved.   Oropharyngeal dysphagia: Noted to have Cortak feeding tube and has been left in for 27 days so far.  Dietitian following.  Last seen by speech therapy on 7/2.  Patient's mentation has improved.  SLP evaluated and recommending FULL Liquid Diet.  The dietitian has recommended advancement of the Corpak feeding tube to postpyloric to avoid emesis and if she can tolerate a full liquid  diet they are recommending beginning the tube with a venting G-tube in the future.  Will see how she does with the full liquid diet for now -KUB done this AM showed Redemonstrated weighted feeding tube, which has been retracted in  the interim, now coiled in the stomach.   Colitis: Stable.   Status post pancreatic transplant/diabetes: CTM CBGs.  CBGs ranging from 100-123   Anemia of Chronic Disease/ Normocytic Anemia: Relatively Stable. Hgb/Hct Trend:  Recent Labs  Lab 03/03/24 0706 03/04/24 0606 03/05/24 0459 03/06/24 0508 03/08/24 0338 03/10/24 0222 03/12/24 0512  HGB 8.3* 8.5* 8.8* 9.8* 9.5* 10.1* 9.1*  HCT 25.6* 25.2* 27.1* 29.4* 28.6* 30.7* 27.6*  MCV 96.6 93.3 94.8 95.8 97.9 97.2 95.8  -CTM for S/Sx of Bleeding; No overt bleeding noted. Repeat CBC in the AM    Moderate Protein Calorie Malnutrition / Underweight: Nutrition Status: Nutrition Problem: Moderate Malnutrition Etiology: chronic illness (ESRD on HD/DM) Signs/Symptoms: mild fat depletion, mild muscle depletion Interventions: Tube feeding Estimated body mass index is 18.2 kg/m as calculated from the following:   Height as of this encounter: 5' 7.5 (1.715 m).   Weight as of this encounter: 53.5 kg.   DVT prophylaxis: heparin  injection 5,000 Units Start: 03/14/24 0600 SCDs Start: 02/14/24 0725    Code Status: Full Code Family Communication: No family currently at bedside Disposition Plan:  Level of care: Progressive Status is: Inpatient Remains inpatient appropriate  because: His further clinical improvement and clearance by the specialist   Consultants:  Nephrology Neurosurgery Neurology Interventional Radiology  Procedures:  As delineated as above  Antimicrobials:  Anti-infectives (From admission, onward)    Start     Dose/Rate Route Frequency Ordered Stop   02/27/24 1200  vancomycin  (VANCOREADY) IVPB 500 mg/100 mL  Status:  Discontinued        500 mg 100 mL/hr over 60 Minutes Intravenous Every  M-W-F (Hemodialysis) 02/25/24 1129 02/25/24 1436   02/26/24 2200  ceFAZolin  (ANCEF ) IVPB 1 g/50 mL premix        1 g 100 mL/hr over 30 Minutes Intravenous Every 24 hours 02/26/24 1009 03/03/24 2140   02/25/24 1530  vancomycin  (VANCOREADY) IVPB 500 mg/100 mL        500 mg 100 mL/hr over 60 Minutes Intravenous Every M-W-F (Hemodialysis) 02/25/24 1436 02/25/24 1934   02/23/24 1800  meropenem  (MERREM ) 1 g in sodium chloride  0.9 % 100 mL IVPB  Status:  Discontinued        1 g 200 mL/hr over 30 Minutes Intravenous Every 24 hours 02/23/24 1625 02/26/24 1009   02/23/24 1800  vancomycin  (VANCOREADY) IVPB 1250 mg/250 mL        1,250 mg 166.7 mL/hr over 90 Minutes Intravenous  Once 02/23/24 1625 02/23/24 1933   02/17/24 2200  piperacillin -tazobactam (ZOSYN ) IVPB 2.25 g        2.25 g 100 mL/hr over 30 Minutes Intravenous Every 8 hours 02/17/24 1412 02/21/24 2336   02/14/24 1800  vancomycin  (VANCOREADY) IVPB 2000 mg/400 mL        2,000 mg 200 mL/hr over 120 Minutes Intravenous  Once 02/14/24 1709 02/14/24 2048   02/14/24 1622  piperacillin -tazobactam (ZOSYN ) IVPB 3.375 g  Status:  Discontinued        3.375 g 100 mL/hr over 30 Minutes Intravenous Every 6 hours 02/14/24 1622 02/17/24 1412   02/14/24 1545  piperacillin -tazobactam (ZOSYN ) IVPB 3.375 g  Status:  Discontinued        3.375 g 12.5 mL/hr over 240 Minutes Intravenous Every 8 hours 02/14/24 1451 02/14/24 1453   02/14/24 1545  piperacillin -tazobactam (ZOSYN ) IVPB 2.25 g  Status:  Discontinued        2.25 g 100 mL/hr over 30 Minutes Intravenous Every 8 hours 02/14/24 1453 02/14/24 1622   02/14/24 0800  cefTRIAXone  (ROCEPHIN ) 2 g in sodium chloride  0.9 % 100 mL IVPB  Status:  Discontinued        2 g 200 mL/hr over 30 Minutes Intravenous Every 24 hours 02/14/24 0730 02/14/24 1451   02/14/24 0800  metroNIDAZOLE  (FLAGYL ) IVPB 500 mg  Status:  Discontinued        500 mg 100 mL/hr over 60 Minutes Intravenous Every 12 hours 02/14/24 0730 02/14/24  1451       Subjective: Seen and examined at bedside and she was agitated and complaining of nausea and abdominal discomfort.  Also concerned about not having an IV and wanted her soft mitten restraints off.  On lightheadedness or dizziness.  No other concerns or complaints this time.  Objective: Vitals:   03/12/24 1725 03/12/24 1800 03/12/24 1807 03/12/24 2000  BP: (!) (P) 92/58 114/65 120/70 125/87  Pulse: (!) (P) 106 (!) 103  (!) 110  Resp: (P) 13 10  14   Temp:   98 F (36.7 C)   TempSrc:      SpO2: (P) 100% 100%  100%  Weight:  55.1 kg    Height:  Intake/Output Summary (Last 24 hours) at 03/12/2024 2046 Last data filed at 03/12/2024 1807 Gross per 24 hour  Intake --  Output 3000 ml  Net -3000 ml   Filed Weights   03/12/24 0729 03/12/24 1350 03/12/24 1800  Weight: 54.8 kg 55.7 kg 55.1 kg   Examination: Physical Exam:  Constitutional: WN/WD chronically ill-appearing female who appears uncomfortable Respiratory: Diminished to auscultation bilaterally, no wheezing, rales, rhonchi or crackles. Normal respiratory effort and patient is not tachypenic. No accessory muscle use.  Unlabored breathing Cardiovascular: RRR, no murmurs / rubs / gallops. S1 and S2 auscultated. No extremity edema. Abdomen: Soft, tender to palpate and distended. Bowel sounds positive.  GU: Deferred. Musculoskeletal: No clubbing / cyanosis of digits/nails. No joint deformity upper and lower extremities.  Skin: No rashes, lesions, ulcers limited skin evaluation. No induration; Warm and dry.  Neurologic: She is awake and interactive Psychiatric: Extremely agitated and upset  Data Reviewed: I have personally reviewed following labs and imaging studies  CBC: Recent Labs  Lab 03/06/24 0508 03/08/24 0338 03/10/24 0222 03/12/24 0512  WBC 6.9 8.5 6.4 4.3  HGB 9.8* 9.5* 10.1* 9.1*  HCT 29.4* 28.6* 30.7* 27.6*  MCV 95.8 97.9 97.2 95.8  PLT 267 230 213 169   Basic Metabolic Panel: Recent Labs   Lab 03/06/24 0508 03/08/24 0338 03/10/24 0222 03/10/24 1100 03/11/24 0643 03/12/24 0512  NA 135 136 129*  --  132* 127*  K 3.9 4.8 6.6* 4.4 5.6* 5.8*  CL 90* 92* 82*  --  88* 83*  CO2 25 25 27   --  26 29  GLUCOSE 123* 113* 123*  --  124* 107*  BUN 25* 29* 60*  --  33* 45*  CREATININE 3.93* 4.04* 6.78*  --  4.36* 5.63*  CALCIUM  10.8* 10.7* 10.6*  --  10.0 9.7  MG 2.6*  --   --   --   --   --   PHOS 6.5*  --   --   --   --   --    GFR: Estimated Creatinine Clearance: 11.3 mL/min (A) (by C-G formula based on SCr of 5.63 mg/dL (H)). Liver Function Tests: Recent Labs  Lab 03/06/24 0508 03/08/24 0338 03/10/24 0222  AST  --  19 29  ALT  --  <5 9  ALKPHOS  --  81 109  BILITOT  --  0.2 0.7  PROT  --  6.2* 6.2*  ALBUMIN 3.0* 2.9* 2.9*   No results for input(s): LIPASE, AMYLASE in the last 168 hours. No results for input(s): AMMONIA in the last 168 hours. Coagulation Profile: No results for input(s): INR, PROTIME in the last 168 hours. Cardiac Enzymes: No results for input(s): CKTOTAL, CKMB, CKMBINDEX, TROPONINI in the last 168 hours. BNP (last 3 results) No results for input(s): PROBNP in the last 8760 hours. HbA1C: No results for input(s): HGBA1C in the last 72 hours. CBG: Recent Labs  Lab 03/11/24 2311 03/12/24 0338 03/12/24 0807 03/12/24 1156 03/12/24 2009  GLUCAP 96 117* 123* 122* 100*   Lipid Profile: No results for input(s): CHOL, HDL, LDLCALC, TRIG, CHOLHDL, LDLDIRECT in the last 72 hours. Thyroid Function Tests: No results for input(s): TSH, T4TOTAL, FREET4, T3FREE, THYROIDAB in the last 72 hours. Anemia Panel: No results for input(s): VITAMINB12, FOLATE, FERRITIN, TIBC, IRON , RETICCTPCT in the last 72 hours. Sepsis Labs: No results for input(s): PROCALCITON, LATICACIDVEN in the last 168 hours.  No results found for this or any previous visit (from the past 240 hours).  Radiology Studies: DG  Abd Portable 1V Result Date: 03/12/2024 CLINICAL DATA:  [id number] Encounter for feeding tube placement [id number] EXAM: PORTABLE ABDOMEN - 1 VIEW COMPARISON:  March 04, 2024 FINDINGS: Weighted feeding tube is coiled in the stomach. Nonobstructive bowel gas pattern. No pneumoperitoneum. No organomegaly or radiopaque calculi. The lung bases are clear. IMPRESSION: Redemonstrated weighted feeding tube, which has been retracted in the interim, now coiled in the stomach. Electronically Signed   By: Rogelia Myers M.D.   On: 03/12/2024 12:34   Scheduled Meds:  acetaminophen   650 mg Per Tube Q4H   Or   acetaminophen   650 mg Rectal Q4H   aspirin   81 mg Per Tube Daily   Chlorhexidine  Gluconate Cloth  6 each Topical Q0600   cycloSPORINE   200 mg Per Tube QHS   cycloSPORINE   225 mg Per Tube Daily   darbepoetin (ARANESP ) injection - DIALYSIS  60 mcg Subcutaneous Q Fri-1800   [START ON 03/13/2024] feeding supplement (NEPRO CARB STEADY)  1,000 mL Per Tube Q24H   [START ON 03/13/2024] feeding supplement (NEPRO CARB STEADY)  237 mL Oral BID BM   [START ON 03/14/2024] heparin  injection (subcutaneous)  5,000 Units Subcutaneous Q8H   insulin  aspart  0-9 Units Subcutaneous Q4H   lacosamide   100 mg Per Tube BID   metoCLOPramide  (REGLAN ) injection  5 mg Intravenous Q8H   multivitamin  1 tablet Per Tube QHS   mycophenolate   500 mg Per Tube BID   predniSONE   15 mg Per Tube Q breakfast   sevelamer  carbonate  2.4 g Per Tube TID WC   sodium bicarbonate   1,300 mg Per Tube TID   sodium chloride  flush  3 mL Intravenous Q12H   Continuous Infusions:   LOS: 27 days   Alejandro Marker, DO [name] Available via Epic secure chat 7am-7pm After these hours, please refer to coverage provider listed on [email or web address] 03/12/2024, 8:46 PM

## 2024-03-12 NOTE — Procedures (Addendum)
 Received patient in bed to unit.  Alert and oriented to self. Informed consent signed and in chart.   TX duration: 3 hours  Patient tolerated well.  Transported back to the room  Alert, without acute distress.  Hand-off given to patient's nurse.   Access used: right arm fistula Access issues: none  Total UF removed: 3 liter Medication(s) given: dilaudid    Greig Silvan, RN [facility]

## 2024-03-12 NOTE — Progress Notes (Addendum)
 OT Cancellation Note  Patient Details Name: [name] [name] MRN: [id number] DOB: [birth year]   Cancelled Treatment:    Reason Eval/Treat Not Completed: Medical issues which prohibited therapy (pt hypertensive, RN asking to hold OT at this time, will follow up as appropriate/schedule permitting)  Addendum 1219: BP remains elevated 194/86  Laneta POUR, OTD, OTR/L SecureChat Preferred Acute Rehab [phone]   Capri Veals K Koonce 03/12/2024, 8:09 AM

## 2024-03-12 NOTE — Hospital Course (Addendum)
 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 CT scan suggested colitis.  Patient's mentation continued to worsen. CT head was subsequently done which revealed intracranial hemorrhage.  Patient was admitted to the ICU for hypertensive ICH, cva, initially in ICU intubated. Palliative following for goals of care  G tube placed by IR but needed GJ, now placed postpyloric cortrack.  Pursuing LTACH/select> will follow gradual decline and has been overtone after appeal Patient will be discharged to [facility] once approved  Significant Hospital Events:  6/11 ED w n/v AMS. Abx IVF for colitis 6/12 Admit. AMS decr LOC. Incr O2 req. PCCM consult and code stroke called seen w rapid response nephro NP then neuro  6/13 Intubated with EVD, responding to commands  6/14 Extubated 6/17 acute L cerebellar infarct. Keppra  dc  6/18 Dialysis, fevering, EVD at 10 (129 mL last 24 hrs) 6/19 Narcotic dependence a driving factor. +Vasospasm on TCD. CTA with evolving Left cerebellar infarct, no HT or mass effect, substantially decreased caliber of multiple intracranial arteries and bilateral [place] branches (especially: Distal left V4, supraclinoid ICAs, MCA and ACA origins), appearance c/w widespread Vasospasm, Vasoconstriction. No associated large vessel occlusion. 6/21 CSF sampline from EVD -- WBC 244 RBC 19000, cx ngtd  6/25: diagnostic angio today with nsgy - negative 6/26 evd leaking when clamped  6/30 evd removed, HD overnight, HTN improved, 7 days cefazolin  complete 7/1 more lethargic> minimal response to narcan  x2, oxy stopped, EEG neg, MRI - new small left parietal cva, cortrak placed postpyloric, vagal episodes with N/V 7/2 iHD, vimpat  started 7/9 she appears agitated.  IVs were removed.  LP to be initiated to  evaluate her opening pressure and to ensure that she does not have meningitis if this is still pending to be done. 7/10 LP done at bedside by Neurosurgery. Cortrak was removed yesterday evening and replaced by Fluoro team today.  7/11 Cortrak obstructed and reomved and had to be replaced by Flouro. Calorie Count initiated. Undergoing Dialysis today  7/12: Having chest discomfort. Palliative Care Consulted for GOC Discussion 7/14: Agreeable to J-tube with venting G-tube. IR consulted. SLP continues to recommend Thin (Full) Liquids 7/24- ltach denied and appeal initiated  7/28: Approved for LTAC.    Subjective: Patient seen and examined Mother at the bedside.  Patient resting comfortably.  She has no complaint Overnight afebrile BP remains fairly controlled blood sugar stable  Labs pending with BMP, CBC remained stable HB UP AT 8.2  Discharge diagnosis assessment and plan:  Hypertensive SAH, ICH with brain compression, communicating hydrocephalus: Extensive hospitalization , seen by neurology/neurosurgery s/p right frontal pressure ventriculostomy via burr hole and EVD was removed on 6/30. MRI brain-small foci of hemorrhage in the posterior left cerebellum without evidence for acute infarct. Punctate focus of acute infarct involving the medial left occipital cortex, evolving subacute infarct in the left parietal lobe, trace blood products. Due to concern for hydrocephalus, neurosurgery did LP, spinal fluid pressures were low and ruled out meningitis. Per Dr. Mavis, she would not benefit from a VP shunt  Palliative medicine following for GOC.  Episode of fever 7/25: At risk of aspiration.No recurrence of fever.X-ray showed mild vascular congestion 7/26-clinically no features of pneumonia  Oropharyngeal Dysphagia History of severe gastroparesis with nausea and vomiting Moderate malnutrition Deconditioning and generalized pain Chronic opiate use-PTA on Oxy 5/325  3 times daily per med  rec: G-tube placed by IR but needs GJ (after tract heals in about 4 weeks for jejunal feeds and gastric venting) Cont  postpyloric feeding w/ cortrak and FLD per SLP.RD following continue tube feeding.  Cont Reglan  w/ cortrak cont pain management and wean iv opiates to po opiates/non opaites as able-and continue multimodal pain management. Patient will benefit from LTACH>she cannot go to SNF with the post pyloric NGT, will need GJ conversion in few weeks, can be done in LTACH),will need in depth PT/OT due to lengthy stay  and will need ongoing HD. Peer to peer review declined by insurance MD 7/24 and appeal in process. LTACH despite recommending she would highly benefit with LTAC level of care with extensive rehabilitation and ongoing nutritional augmentation with medical management  LTAC has been approved 7/28  Concern for seizure activity post ICH: Neurology input appreciated EEG completed continue on Vimpat ,seizure precaution.     Vasospasm with left cerebellar infarct, on 03/04/24:  LDL 63.HbA1c 4.2. Will benefit in the LTACH/select facility given hemodialysis and ongoing significant medical issues. Neuro signed off.   ESRD on hemodialysis, MWF Hyperkalemia Failed renal txp: Nephrology following for HD only 25 mins on 7/25.Cont home cellCept ,cyclosporine , and Prednisone   Received Lokelma  7/26 for hyperkalemia.  Potassium stable.  Due for HD today   Essential Hypertension: BP fairly controlled.midodrine  has been discontinued in ICU. Continue clonidine  patch> increased to 0.3 on 7/25,cont  Norvasc  10, Lopressor  50 mg, Hydralazine  75 tid. Cont prn iv Optimize blood pressure due to #1    Chest Pain: Atypical, 2D echo showed EF normal 60 to 65%, with no regional WMA, mild LVH.Continue pain control as needed   Bradycardia:  Secondary to vagal response. Resolved.   Colitis Abdominal pain: Stable.FMS has been discontinued.    Status post Pancreatic Transplant Diabetes mellitus Blood  sugar well-controlled, continue sliding scale insulin . on mycophenolate , cyclosporine  and pred 15 Recent Labs  Lab 03/30/24 1620 03/30/24 1953 03/30/24 2309 03/31/24 0314 03/31/24 0754  GLUCAP 170* 134* 123* 119* 115*     Anemia of Chronic Disease H&H stable.  Nutrition Problem: Moderate Malnutrition Etiology: chronic illness (ESRD on HD/DM) Signs/Symptoms: mild fat depletion, mild muscle depletion Interventions: Tube feeding   DVT prophylaxis: heparin  injection 5,000 Units Start: 03/14/24 0600 SCDs Start: 02/14/24 0725 Code Status:   Code Status: Full Code Family Communication: plan of care discussed with patient/family- mother updated bedside  Patient status is: Remains hospitalized because of severity of illness Level of care: Progressive   Dispo: The patient is from: home.            Anticipated disposition: LTACH today Objective: Vitals last 24 hrs: Vitals:   03/30/24 2356 03/31/24 0311 03/31/24 0712 03/31/24 0858  BP: (!) 163/52 (!) 149/60  (!) 178/66  Pulse: 81 87  (!) 101  Resp: 14 13  20   Temp:  97.6 F (36.4 C)  99.3 F (37.4 C)  TempSrc:  Oral  Oral  SpO2: 95% 95%  97%  Weight:   57.2 kg   Height:       Physical Examination: General exam: alert awake, oriented pleasant.  Core Trak present HEENT:Oral mucosa moist, Ear/Nose WNL grossly Respiratory system: Bilaterally clear BS,no use of accessory muscle Cardiovascular system: S1 & S2 +, No JVD. Gastrointestinal system: Abdomen soft, ND, peg+ Nervous System: Alert, awake, moving all extremities,and following commands. Extremities: RUE AVG+  Skin: No rashes,no icterus. MSK: weak muscle bulk,tone, power

## 2024-03-12 NOTE — Progress Notes (Signed)
 Speech Language Pathology Treatment: Dysphagia;Cognitive-Linguistic  Patient Details Name: [name] [name] MRN: [id number] DOB: May 07, 1981 Today's Date: 03/12/2024 Time: 8958-8894 SLP Time Calculation (min) (ACUTE ONLY): 24 min  Assessment / Plan / Recommendation Clinical Impression  Pt is significantly more alert than what was previously observed per SLP documentation, making wants/needs known. She re-oriented herself to time given Mod semantic and environmental cues. Pt remains impulsive and has safety mitts in place but is able to follow commands to demonstrate inhibition, although this is limited. RN reports persistent emesis immediately following trials of POs from floor stock. Discussed with MD, who asks that SLP proceed with PO trials. Pt was hesitant to trial anything beyond ice chips due to nausea. She masticated and initiated a seemingly prompt swallow response in all trials but this was followed by period of unproductive belching and heaving. The primary concern in previous sessions has been related to her level of alertness and cognition, which remains impaired but has improved considerably. Discussed possibility of initiating a liquid diet as more consistent access to POs may be beneficial for pt's mentation and nutrition. Although trials were limited today, she is now adequately alert and there has not been any previously documented clinical concern for aspiration. Will initiate full liquid diet and f/u.    HPI HPI: Patient is a 43 y.o. female with PMH: ESRD on HD, s/p renal and pancreatic transplant, DM-1 with gastroparesis, anxiety, narcotic abuse, vision loss, non-compliance with medical treatment. She presented to the hospital on 02/14/24 with nausea and vomiting. She was admitted with possible sepsis secondary to colitis, acute respiratory failure with hypoxia, acute metabolic encephalopathy. 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 CRRT 6/13-15, EVD placed on 6/12;  removed 6/30. She was emergently intubated on 6/12 and extubated on 6/14.      SLP Plan  Continue with current plan of care          Recommendations  Diet recommendations: Thin liquid Liquids provided via: Teaspoon;Cup;Straw Medication Administration: Whole meds with liquid Supervision: Staff to assist with self feeding;Full supervision/cueing for compensatory strategies Compensations: Minimize environmental distractions;Slow rate;Small sips/bites Postural Changes and/or Swallow Maneuvers: Seated upright 90 degrees;Upright 30-60 min after meal                  Oral care QID   Frequent or constant Supervision/Assistance Dysphagia, unspecified (R13.10)     Continue with current plan of care     Damien Blumenthal, M.A., CCC-SLP Speech Language Pathology, Acute Rehabilitation Services  Secure Chat preferred [phone]   03/12/2024, 11:25 AM

## 2024-03-12 NOTE — Progress Notes (Signed)
 Noted fluoro guided lumbar puncture ordered for this patient. At the time this was ordered, IP team had not yet attempted bedside.  IR PA advised team at that time that typically image guided LP is reserved for those who cannot have / failed attempts have been made on the floor.   Followed up with IP team today. Reported that they intend to attempt this bedside but have yet to be able to obtain consent.

## 2024-03-12 NOTE — Progress Notes (Addendum)
 [facility] Progress Note  Subjective:  Resting K+ 5.8 today, Na 127   Vitals:   03/12/24 0500 03/12/24 0729 03/12/24 0810 03/12/24 0843  BP: (!) 178/79   (!) 196/90  Pulse:      Resp:      Temp:      TempSrc:   Oral   SpO2:      Weight:  54.8 kg    Height:        Exam: General: Lying in bed, no distress Heart: Normal rate, murmur present, no rub Lungs: bilateral chest rise, no increased work of breathing Abdomen: soft, nontender Extremities: No lower extremity edema, warm and well-perfused  Dialysis Access:  RUE AVF +t/b   OP HD:  MWF AF 3h  B400   57.2kg  2K   AVF  Heparin  none - Hectoral 4mcg IV q HD - Mircera 150mcg IV q 2 weeks (last given 6/9 -> due 6/23)   Assessment/Plan: Intracerebral hemorrhage: C/b hydrocephalus, 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 TCDs with evidence of vasospasm 02/11/24. Followed closely by neurosurgery, evd removed 6/30 CVA: New CVA found on 7/1. Per pmd.  ESRD: MWF HD. S/p CRRT 6/12-6/15, then iHD. Plan HD today given ^K+.  Hyperkalemia: k+ 5.8, changing TFs to Nepro (low K+)  HTN: bp's normal to high today. BP's dropped last 1 hr of dialysis yest.  Anemia of ESRD: Hgb 8-10 here. Started darbe at 60 micrograms sq weekly while here. Iron  adequate Secondary HPTH: Ca and Phos high, VDRA stopped 6/21, added sevelamer  via tube. PTH 115.  Could consider low-dose Sensipar .  Continue to monitor for now because hypercalcemia likely a minimal problem at this point.  Pancreas/ kidney transplant (2014): for the pancreas is on home dosing of CyA 225mg  qam and 200 mg qpm + Cellcept  and prednisone .     Myer Fret MD  [facility] 03/12/2024, 8:43 AM  Recent Labs  Lab 03/06/24 0508 03/08/24 0338 03/10/24 0222 03/10/24 1100 03/11/24 0643 03/12/24 0512  HGB 9.8* 9.5* 10.1*  --   --  9.1*  ALBUMIN 3.0* 2.9* 2.9*  --   --   --   CALCIUM  10.8* 10.7* 10.6*  --  10.0 9.7  PHOS 6.5*  --   --   --   --   --   CREATININE 3.93* 4.04*  6.78*  --  4.36* 5.63*  K 3.9 4.8 6.6*   < > 5.6* 5.8*   < > = values in this interval not displayed.   No results for input(s): IRON , TIBC, FERRITIN in the last 168 hours. Inpatient medications:  acetaminophen   650 mg Per Tube Q4H   Or   acetaminophen   650 mg Rectal Q4H   aspirin   81 mg Per Tube Daily   Chlorhexidine  Gluconate Cloth  6 each Topical Q0600   cycloSPORINE   200 mg Per Tube QHS   cycloSPORINE   225 mg Per Tube Daily   darbepoetin (ARANESP ) injection - DIALYSIS  60 mcg Subcutaneous Q Fri-1800   heparin  injection (subcutaneous)  5,000 Units Subcutaneous Q8H   insulin  aspart  0-9 Units Subcutaneous Q4H   lacosamide   100 mg Per Tube BID   metoCLOPramide  (REGLAN ) injection  5 mg Intravenous Q8H   multivitamin  1 tablet Per Tube QHS   mycophenolate   500 mg Per Tube BID   predniSONE   15 mg Per Tube Q breakfast   sevelamer  carbonate  2.4 g Per Tube TID WC   sodium bicarbonate   1,300 mg Per Tube  TID   sodium chloride  flush  3 mL Intravenous Q12H   sodium zirconium cyclosilicate   10 g Per Tube Once    feeding supplement (NEPRO CARB STEADY)     albuterol , artificial tears, atropine , butalbital -acetaminophen -caffeine , Gerhardt's butt cream, hydrALAZINE , HYDROmorphone  (DILAUDID ) injection, iohexol , loperamide  HCl, naLOXone  (NARCAN )  injection, ondansetron  (ZOFRAN ) IV, mouth rinse, simethicone 

## 2024-03-13 ENCOUNTER — Inpatient Hospital Stay (HOSPITAL_COMMUNITY)

## 2024-03-13 DIAGNOSIS — K529 Noninfective gastroenteritis and colitis, unspecified: Secondary | ICD-10-CM | POA: Diagnosis not present

## 2024-03-13 DIAGNOSIS — I609 Nontraumatic subarachnoid hemorrhage, unspecified: Secondary | ICD-10-CM | POA: Diagnosis not present

## 2024-03-13 DIAGNOSIS — Z8639 Personal history of other endocrine, nutritional and metabolic disease: Secondary | ICD-10-CM | POA: Diagnosis not present

## 2024-03-13 DIAGNOSIS — I160 Hypertensive urgency: Secondary | ICD-10-CM | POA: Diagnosis not present

## 2024-03-13 LAB — PROTEIN AND GLUCOSE, CSF
Glucose, CSF: 63 mg/dL (ref 40–70)
Total  Protein, CSF: 39 mg/dL (ref 15–45)

## 2024-03-13 LAB — COMPREHENSIVE METABOLIC PANEL WITH GFR
ALT: 12 U/L (ref 0–44)
AST: 21 U/L (ref 15–41)
Albumin: 3.3 g/dL — ABNORMAL LOW (ref 3.5–5.0)
Alkaline Phosphatase: 98 U/L (ref 38–126)
Anion gap: 18 — ABNORMAL HIGH (ref 5–15)
BUN: 25 mg/dL — ABNORMAL HIGH (ref 6–20)
CO2: 32 mmol/L (ref 22–32)
Calcium: 10.7 mg/dL — ABNORMAL HIGH (ref 8.9–10.3)
Chloride: 87 mmol/L — ABNORMAL LOW (ref 98–111)
Creatinine, Ser: 4.13 mg/dL — ABNORMAL HIGH (ref 0.44–1.00)
GFR, Estimated: 13 mL/min — ABNORMAL LOW (ref >60)
Glucose, Bld: 118 mg/dL — ABNORMAL HIGH (ref 70–99)
Potassium: 4.8 mmol/L (ref 3.5–5.1)
Sodium: 137 mmol/L (ref 135–145)
Total Bilirubin: 0.6 mg/dL (ref 0.0–1.2)
Total Protein: 7.1 g/dL (ref 6.5–8.1)

## 2024-03-13 LAB — CBC WITH DIFFERENTIAL/PLATELET
Abs Immature Granulocytes: 0.06 K/uL (ref 0.00–0.07)
Basophils Absolute: 0 K/uL (ref 0.0–0.1)
Basophils Relative: 0 %
Eosinophils Absolute: 0 K/uL (ref 0.0–0.5)
Eosinophils Relative: 0 %
HCT: 30.7 % — ABNORMAL LOW (ref 36.0–46.0)
Hemoglobin: 10.1 g/dL — ABNORMAL LOW (ref 12.0–15.0)
Immature Granulocytes: 1 %
Lymphocytes Relative: 9 %
Lymphs Abs: 0.9 K/uL (ref 0.7–4.0)
MCH: 31.9 pg (ref 26.0–34.0)
MCHC: 32.9 g/dL (ref 30.0–36.0)
MCV: 96.8 fL (ref 80.0–100.0)
Monocytes Absolute: 0.9 K/uL (ref 0.1–1.0)
Monocytes Relative: 10 %
Neutro Abs: 7.8 K/uL — ABNORMAL HIGH (ref 1.7–7.7)
Neutrophils Relative %: 80 %
Platelets: 230 K/uL (ref 150–400)
RBC: 3.17 MIL/uL — ABNORMAL LOW (ref 3.87–5.11)
RDW: 15.7 % — ABNORMAL HIGH (ref 11.5–15.5)
WBC: 9.8 K/uL (ref 4.0–10.5)
nRBC: 0 % (ref 0.0–0.2)

## 2024-03-13 LAB — GLUCOSE, CAPILLARY
Glucose-Capillary: 109 mg/dL — ABNORMAL HIGH (ref 70–99)
Glucose-Capillary: 127 mg/dL — ABNORMAL HIGH (ref 70–99)
Glucose-Capillary: 103 mg/dL — ABNORMAL HIGH (ref 70–99)
Glucose-Capillary: 106 mg/dL — ABNORMAL HIGH (ref 70–99)
Glucose-Capillary: 84 mg/dL (ref 70–99)
Glucose-Capillary: 99 mg/dL (ref 70–99)

## 2024-03-13 LAB — CSF CELL COUNT WITH DIFFERENTIAL
RBC Count, CSF: 26 /mm3 — ABNORMAL HIGH
Tube #: 3
WBC, CSF: 0 /mm3 (ref 0–5)

## 2024-03-13 LAB — PHOSPHORUS: Phosphorus: 6.3 mg/dL — ABNORMAL HIGH (ref 2.5–4.6)

## 2024-03-13 LAB — MAGNESIUM: Magnesium: 2.9 mg/dL — ABNORMAL HIGH (ref 1.7–2.4)

## 2024-03-13 MED ORDER — SEVELAMER CARBONATE 2.4 G PO PACK
2.4000 g | PACK | Freq: Three times a day (TID) | ORAL | Status: DC
  Administered 2024-03-13 – 2024-03-19 (×17): 2.4 g via ORAL
  Filled 2024-03-13 (×28): qty 1

## 2024-03-13 MED ORDER — SIMETHICONE 40 MG/0.6ML PO SUSP: 40.0000 mg | Freq: Four times a day (QID) | ORAL | Status: DC | PRN

## 2024-03-13 MED ORDER — ACETAMINOPHEN 650 MG RE SUPP
650.0000 mg | RECTAL | Status: DC
  Filled 2024-03-13: qty 1

## 2024-03-13 MED ORDER — LIDOCAINE VISCOUS HCL 2 % MT SOLN
6.0000 mL | Freq: Once | OROMUCOSAL | Status: AC
  Administered 2024-03-13: 6 mL via OROMUCOSAL

## 2024-03-13 MED ORDER — SODIUM BICARBONATE 650 MG PO TABS
1300.0000 mg | ORAL_TABLET | Freq: Three times a day (TID) | ORAL | Status: DC
  Administered 2024-03-13 – 2024-03-16 (×9): 1300 mg via ORAL
  Filled 2024-03-13 (×10): qty 2

## 2024-03-13 MED ORDER — RENA-VITE PO TABS
1.0000 | ORAL_TABLET | Freq: Every day | ORAL | Status: DC
  Administered 2024-03-13 – 2024-03-21 (×7): 1 via ORAL
  Filled 2024-03-13 (×7): qty 1

## 2024-03-13 MED ORDER — NEPRO/CARBSTEADY PO LIQD
1000.0000 mL | ORAL | Status: DC
  Administered 2024-03-13 – 2024-03-18 (×6): 1000 mL
  Filled 2024-03-13 (×6): qty 1000

## 2024-03-13 MED ORDER — ASPIRIN 81 MG PO CHEW
81.0000 mg | CHEWABLE_TABLET | Freq: Every day | ORAL | Status: DC
  Administered 2024-03-14 – 2024-03-22 (×7): 81 mg via ORAL
  Filled 2024-03-13 (×8): qty 1

## 2024-03-13 MED ORDER — PREDNISONE 5 MG PO TABS
15.0000 mg | ORAL_TABLET | Freq: Every day | ORAL | Status: DC
  Administered 2024-03-14 – 2024-03-22 (×7): 15 mg via ORAL
  Filled 2024-03-13 (×8): qty 3

## 2024-03-13 MED ORDER — BUTALBITAL-APAP-CAFFEINE 50-325-40 MG PO TABS
1.0000 | ORAL_TABLET | ORAL | Status: DC | PRN
  Administered 2024-03-16 (×3): 1 via ORAL
  Filled 2024-03-13 (×3): qty 1

## 2024-03-13 MED ORDER — IOHEXOL 300 MG/ML  SOLN
25.0000 mL | Freq: Once | INTRAMUSCULAR | Status: AC | PRN
  Administered 2024-03-13: 25 mL

## 2024-03-13 MED ORDER — ENSURE PLUS HIGH PROTEIN PO LIQD
237.0000 mL | Freq: Two times a day (BID) | ORAL | Status: DC
  Administered 2024-03-14 – 2024-03-18 (×2): 237 mL via ORAL

## 2024-03-13 MED ORDER — LACOSAMIDE 50 MG PO TABS
100.0000 mg | ORAL_TABLET | Freq: Two times a day (BID) | ORAL | Status: DC
  Administered 2024-03-13 – 2024-03-22 (×15): 100 mg via ORAL
  Filled 2024-03-13 (×16): qty 2

## 2024-03-13 MED ORDER — CHLORHEXIDINE GLUCONATE CLOTH 2 % EX PADS
6.0000 | MEDICATED_PAD | Freq: Every day | CUTANEOUS | Status: DC
  Administered 2024-03-14 – 2024-03-16 (×3): 6 via TOPICAL

## 2024-03-13 MED ORDER — CYCLOSPORINE MODIFIED (NEORAL) 100 MG/ML PO SOLN
225.0000 mg | Freq: Every day | ORAL | Status: DC
Start: 2024-03-14 — End: 2024-03-22
  Administered 2024-03-14 – 2024-03-22 (×7): 225 mg via ORAL
  Filled 2024-03-13 (×9): qty 2.3

## 2024-03-13 MED ORDER — CYCLOSPORINE MODIFIED (NEORAL) 100 MG/ML PO SOLN
200.0000 mg | Freq: Every day | ORAL | Status: DC
  Administered 2024-03-13 – 2024-03-21 (×8): 200 mg via ORAL
  Filled 2024-03-13 (×10): qty 2

## 2024-03-13 MED ORDER — NEPRO/CARBSTEADY PO LIQD: 1000.0000 mL | ORAL | Status: DC

## 2024-03-13 MED ORDER — LOPERAMIDE HCL 1 MG/7.5ML PO SUSP: 2.0000 mg | ORAL | Status: DC | PRN

## 2024-03-13 MED ORDER — MYCOPHENOLATE 200 MG/ML ORAL SUSPENSION
500.0000 mg | Freq: Two times a day (BID) | ORAL | Status: DC
  Administered 2024-03-13 – 2024-03-22 (×14): 500 mg via ORAL
  Filled 2024-03-13 (×19): qty 2.5

## 2024-03-13 MED ORDER — ACETAMINOPHEN 325 MG PO TABS
650.0000 mg | ORAL_TABLET | ORAL | Status: DC
  Administered 2024-03-13 – 2024-03-16 (×18): 650 mg via ORAL
  Filled 2024-03-13 (×17): qty 2

## 2024-03-13 NOTE — Progress Notes (Signed)
 Pt pulled out cortrak, brown emesis noted on her gown, pt complaining of stomach pain prn diladid & scheduled Reglan  due at 0500 will administer and reassess   A. Andrez, NP notified, will keep tube out for now

## 2024-03-13 NOTE — Progress Notes (Signed)
 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 Hypertensive in ED SBP 240s.  CT scan suggested colitis.  Patient's mentation continued to worsen. CT head was subsequently done which revealed intracranial hemorrhage.  Patient was admitted to the ICU.     Consultants: Critical care medicine.  Neurosurgery.  Neurology.  Nephrology   Significant Hospital Events:  6/11 ED w n/v AMS. Abx IVF for colitis 6/12 Admit. AMS decr LOC. Incr O2 req. PCCM consult and code stroke called seen w rapid response nephro NP then neuro  6/13 Intubated with EVD, responding to commands  6/14 Extubated 6/17 acute L cerebellar infarct. Keppra  dc  6/18 Dialysis, fevering, EVD at 10 (129 mL last 24 hrs) 6/19 Narcotic dependence a driving factor. +Vasospasm on TCD. CTA with evolving Left cerebellar infarct, no HT or mass effect, substantially decreased caliber of multiple intracranial arteries and bilateral Circle-of[place] branches (especially: Distal left V4, supraclinoid ICAs, MCA and ACA origins), appearance c/w widespread Vasospasm, Vasoconstriction. No associated large vessel occlusion. 6/21 CSF sampline from EVD -- WBC 244 RBC 19000, cx ngtd  6/25: diagnostic angio today with nsgy - negative 6/26 evd leaking when clamped  6/30 evd removed, HD overnight, HTN improved, 7 days cefazolin  complete 7/1 more lethargic> minimal response to narcan  x2, oxy stopped, EEG neg, MRI - new small left parietal cva, cortrak placed postpyloric, vagal episodes with N/V 7/2 iHD, vimpat  started 7/9 she appears agitated.  IVs were removed.  LP to be initiated to evaluate her opening pressure and to ensure that she does not have meningitis if this is still pending to be done. 7/10 LP  done at bedside by Neurosurgery. Cortrak was removed yesterday evening and replaced by Fluoro team today.    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   -Underwent placement of right frontal pressure ventriculostomy via bur hole. -Ventriculostomy was removed on 6/30. -Seems to be stable from neurological standpoint except for headaches.  Continue scheduled Tylenol  and as needed Fioricet .  Also noted to be on as needed morphine .  Could add as needed oxycodone . -Underwent CT head the day before yesterday as ordered by neurosurgery.  This raised concern for a new infarct.  Subsequently MRI brain was done.  This showed small foci of hemorrhage in the posterior left cerebellum without evidence for acute infarct.  Punctate focus of acute infarct involving the medial left occipital cortex was seen.  Evolving subacute infarct in the left parietal lobe seen.  Trace blood products were noted. -Seen by neurosurgery and they are concerned about the hydrocephalus.  They have ordered LP to measure opening pressure and to ensure that she does not have meningitis with her prolonged intraventricular catheter..  IR was consulted to do this but the IR team advised that typically image guided LP is reserved for those who can and have failed attempts on the floor. Neurosurgery now doing LP bedside today and needle placed into the L3-4 interspace.  -The neurosurgical team feels that she would perhaps benefit from a ventriculoperitoneal shunt but want to rule out meningitis and measure her spinal fluid pressure first.    Concern for seizure activity: Seen by neurology.  Underwent EEG.  Patient was  started on Lacosamide  100 mg po BID.  Improvement noted.   Vasospasm with left cerebellar infarct: Seen by neurology.  Noted to be on aspirin . LDL 63.  HbA1c 4.2. Patient noted to be on midodrine  for BP  augmentation but now due persistent hypotension midodrine  has been discontinued. PT and OT following.  SNF being pursued. -A small punctate focus of acute infarct was noted in the medial left occipital cortex.  Per neurosurgery there is nothing to be done for that.  Continue current management.  Will run this by neurology as well.   Essential Hypertension: Midodrine  was discontinued.  Blood pressure did drop with hemodialysis but now elevated.  Pain could be contributing to high readings.  She seems to have quite brittle blood pressure.  Would like to avoid antihypertensives as much as possible.  Continue as needed medications for now. CTM BP per Protocol. Last BP reading was 120/64   End-stage renal disease on hemodialysis/Hyperkalemia: History of failed transplant is present.  She is chronically immunosuppressed with CellCept , Cyclosporine , and Prednisone  BUN/Cr Trend: Recent Labs  Lab 03/05/24 0459 03/06/24 0508 03/08/24 0338 03/10/24 0222 03/11/24 0643 03/12/24 0512 03/13/24 0922  BUN 37* 25* 29* 60* 33* 45* 25*  CREATININE 4.82* 3.93* 4.04* 6.78* 4.36* 5.63* 4.13*  -Further management Management per nephrology. Patient is on sodium bicarbonate . K+ Trend:  Recent Labs  Lab 03/06/24 0508 03/08/24 0338 03/10/24 0222 03/10/24 1100 03/11/24 0643 03/12/24 0512 03/13/24 0922  K 3.9 4.8 6.6* 4.4 5.6* 5.8* 4.8  -Potassium level improved with hemodialysis. Nephrology changed her tube feedings to Nepro and dialyzed the patient 7/9 and planning on Dialysis on 7/11 -Avoid Nephrotoxic Medications, Contrast Dyes, Hypotension and Dehydration to Ensure Adequate Renal Perfusion and will need to Renally Adjust Meds -Continue to Monitor and Trend Renal Function carefully and repeat CMP in the AM   Bradycardia: Secondary to vagal response. Resolved.   Oropharyngeal dysphagia: Noted to have Cortak feeding tube but was removed overnight.  Dietitian following.  Last seen by speech therapy on 7/2.   Patient's mentation has improved.  SLP evaluated and recommending FULL Liquid Diet.  The dietitian has recommended feeding tube to be placed postpyloric to avoid emesis and if she can tolerate a full liquid diet they are recommending beginning the tube with a venting G-tube in the future.  Will see how she does with the full liquid diet for now and replace Cortrak via Fluoro team.  -KUB done this AM showed Redemonstrated weighted feeding tube, which has been retracted in  the interim, now coiled in the stomach.   Colitis: Stable.   Status post Pancreatic Transplant/Diabetes: C/w Sensitive Novology q4h. CTM CBGs.  CBGs ranging from 100-123   Anemia of Chronic Disease/ Normocytic Anemia: Relatively Stable. Hgb/Hct Trend:  Recent Labs  Lab 03/04/24 0606 03/05/24 0459 03/06/24 0508 03/08/24 0338 03/10/24 0222 03/12/24 0512 03/13/24 0922  HGB 8.5* 8.8* 9.8* 9.5* 10.1* 9.1* 10.1*  HCT 25.2* 27.1* 29.4* 28.6* 30.7* 27.6* 30.7*  MCV 93.3 94.8 95.8 97.9 97.2 95.8 96.8  -Getting Darbepoetin Alfa  60 mcg sq weekly -CTM for S/Sx of Bleeding; No overt bleeding noted. Repeat CBC in the AM    Moderate Protein Calorie Malnutrition / Underweight: Nutrition Status: Nutrition Problem: Moderate Malnutrition Etiology: chronic illness (ESRD on HD/DM) Signs/Symptoms: mild fat depletion, mild muscle depletion Interventions: Tube feeding Estimated body mass index is 18.74 kg/m as calculated from the following:   Height as of this encounter: 5' 7.5 (1.715 m).   Weight as  of this encounter: 55.1 kg.   DVT prophylaxis: heparin  injection 5,000 Units Start: 03/14/24 0600 SCDs Start: 02/14/24 0725    Code Status: Full Code Family Communication: No family present @ bedside  Disposition Plan:  Level of care: Progressive Status is: Inpatient Remains inpatient appropriate because: Needs further clinical improvement and clearance by the specialist    Consultants:   Nephrology Neurosurgery Neurology Interventional Radiology  Procedures:  As delineated as above  Antimicrobials:  Anti-infectives (From admission, onward)    Start     Dose/Rate Route Frequency Ordered Stop   02/27/24 1200  vancomycin  (VANCOREADY) IVPB 500 mg/100 mL  Status:  Discontinued        500 mg 100 mL/hr over 60 Minutes Intravenous Every M-W-F (Hemodialysis) 02/25/24 1129 02/25/24 1436   02/26/24 2200  ceFAZolin  (ANCEF ) IVPB 1 g/50 mL premix        1 g 100 mL/hr over 30 Minutes Intravenous Every 24 hours 02/26/24 1009 03/03/24 2140   02/25/24 1530  vancomycin  (VANCOREADY) IVPB 500 mg/100 mL        500 mg 100 mL/hr over 60 Minutes Intravenous Every M-W-F (Hemodialysis) 02/25/24 1436 02/25/24 1934   02/23/24 1800  meropenem  (MERREM ) 1 g in sodium chloride  0.9 % 100 mL IVPB  Status:  Discontinued        1 g 200 mL/hr over 30 Minutes Intravenous Every 24 hours 02/23/24 1625 02/26/24 1009   02/23/24 1800  vancomycin  (VANCOREADY) IVPB 1250 mg/250 mL        1,250 mg 166.7 mL/hr over 90 Minutes Intravenous  Once 02/23/24 1625 02/23/24 1933   02/17/24 2200  piperacillin -tazobactam (ZOSYN ) IVPB 2.25 g        2.25 g 100 mL/hr over 30 Minutes Intravenous Every 8 hours 02/17/24 1412 02/21/24 2336   02/14/24 1800  vancomycin  (VANCOREADY) IVPB 2000 mg/400 mL        2,000 mg 200 mL/hr over 120 Minutes Intravenous  Once 02/14/24 1709 02/14/24 2048   02/14/24 1622  piperacillin -tazobactam (ZOSYN ) IVPB 3.375 g  Status:  Discontinued        3.375 g 100 mL/hr over 30 Minutes Intravenous Every 6 hours 02/14/24 1622 02/17/24 1412   02/14/24 1545  piperacillin -tazobactam (ZOSYN ) IVPB 3.375 g  Status:  Discontinued        3.375 g 12.5 mL/hr over 240 Minutes Intravenous Every 8 hours 02/14/24 1451 02/14/24 1453   02/14/24 1545  piperacillin -tazobactam (ZOSYN ) IVPB 2.25 g  Status:  Discontinued        2.25 g 100 mL/hr over 30 Minutes Intravenous Every 8 hours 02/14/24 1453 02/14/24 1622    02/14/24 0800  cefTRIAXone  (ROCEPHIN ) 2 g in sodium chloride  0.9 % 100 mL IVPB  Status:  Discontinued        2 g 200 mL/hr over 30 Minutes Intravenous Every 24 hours 02/14/24 0730 02/14/24 1451   02/14/24 0800  metroNIDAZOLE  (FLAGYL ) IVPB 500 mg  Status:  Discontinued        500 mg 100 mL/hr over 60 Minutes Intravenous Every 12 hours 02/14/24 0730 02/14/24 1451       Subjective: Seen and examined at bedside and she had removed her Cortrak overnight.  Continues to complain of nausea and abdominal discomfort.  Had vomited earlier.  Plan is for her to undergo LP today.  No chest discomfort.  No other concerns or complaints at this time.  Objective: Vitals:   03/13/24 0400 03/13/24 0830 03/13/24 1140 03/13/24 1600  BP: 136/79 (!) 144/72 ROLLEN)  141/81 120/64  Pulse:  (!) 110 (!) 111 (!) 107  Resp: 19 17 20 17   Temp:  99 F (37.2 C) 98.4 F (36.9 C) 98.2 F (36.8 C)  TempSrc:  Axillary Oral Oral  SpO2:  96% 95% 93%  Weight:      Height:        Intake/Output Summary (Last 24 hours) at 03/13/2024 1844 Last data filed at 03/13/2024 1230 Gross per 24 hour  Intake 3 ml  Output 40 ml  Net -37 ml   Filed Weights   03/12/24 0729 03/12/24 1350 03/12/24 1800  Weight: 54.8 kg 55.7 kg 55.1 kg   Examination: Physical Exam:  Constitutional: WN/WD, chronically ill-appearing female who appears uncomfortable Respiratory: Diminished to auscultation bilaterally, no wheezing, rales, rhonchi or crackles. Normal respiratory effort and patient is not tachypenic. No accessory muscle use.  Unlabored breathing Cardiovascular: RRR, no murmurs / rubs / gallops. S1 and S2 auscultated. No extremity edema.  Abdomen: Soft, non-tender, non-distended. Bowel sounds positive.  GU: Deferred. Musculoskeletal: No clubbing / cyanosis of digits/nails. No joint deformity upper and lower extremities.  Skin: No rashes, lesions, ulcers. No induration; Warm and dry.  Neurologic: Awake and interactive Psychiatric: Anxious  mood  Data Reviewed: I have personally reviewed following labs and imaging studies  CBC: Recent Labs  Lab 03/08/24 0338 03/10/24 0222 03/12/24 0512 03/13/24 0922  WBC 8.5 6.4 4.3 9.8  NEUTROABS  --   --   --  7.8*  HGB 9.5* 10.1* 9.1* 10.1*  HCT 28.6* 30.7* 27.6* 30.7*  MCV 97.9 97.2 95.8 96.8  PLT 230 213 169 230   Basic Metabolic Panel: Recent Labs  Lab 03/08/24 0338 03/10/24 0222 03/10/24 1100 03/11/24 0643 03/12/24 0512 03/13/24 0922  NA 136 129*  --  132* 127* 137  K 4.8 6.6* 4.4 5.6* 5.8* 4.8  CL 92* 82*  --  88* 83* 87*  CO2 25 27  --  26 29 32  GLUCOSE 113* 123*  --  124* 107* 118*  BUN 29* 60*  --  33* 45* 25*  CREATININE 4.04* 6.78*  --  4.36* 5.63* 4.13*  CALCIUM  10.7* 10.6*  --  10.0 9.7 10.7*  MG  --   --   --   --   --  2.9*  PHOS  --   --   --   --   --  6.3*   GFR: Estimated Creatinine Clearance: 15.4 mL/min (A) (by C-G formula based on SCr of 4.13 mg/dL (H)). Liver Function Tests: Recent Labs  Lab 03/08/24 0338 03/10/24 0222 03/13/24 0922  AST 19 29 21   ALT 5 9 12   ALKPHOS 81 109 98  BILITOT 0.2 0.7 0.6  PROT 6.2* 6.2* 7.1  ALBUMIN 2.9* 2.9* 3.3*   No results for input(s): LIPASE, AMYLASE in the last 168 hours. No results for input(s): AMMONIA in the last 168 hours. Coagulation Profile: No results for input(s): INR, PROTIME in the last 168 hours. Cardiac Enzymes: No results for input(s): CKTOTAL, CKMB, CKMBINDEX, TROPONINI in the last 168 hours. BNP (last 3 results) No results for input(s): PROBNP in the last 8760 hours. HbA1C: No results for input(s): HGBA1C in the last 72 hours. CBG: Recent Labs  Lab 03/12/24 2321 03/13/24 0317 03/13/24 0825 03/13/24 1138 03/13/24 1531  GLUCAP 108* 109* 127* 103* 106*   Lipid Profile: No results for input(s): CHOL, HDL, LDLCALC, TRIG, CHOLHDL, LDLDIRECT in the last 72 hours. Thyroid Function Tests: No results for input(s): TSH, T4TOTAL,  FREET4,  T3FREE, THYROIDAB in the last 72 hours. Anemia Panel: No results for input(s): VITAMINB12, FOLATE, FERRITIN, TIBC, IRON , RETICCTPCT in the last 72 hours. Sepsis Labs: No results for input(s): PROCALCITON, LATICACIDVEN in the last 168 hours.  Recent Results (from the past 240 hours)  CSF culture w Gram Stain     Status: None (Preliminary result)   Collection Time: 03/13/24 10:40 AM   Specimen: Back; Cerebrospinal Fluid  Result Value Ref Range Status   Specimen Description BACK  Final   Special Requests NONE  Final   Gram Stain   Final    NO WBC SEEN NO ORGANISMS SEEN CYTOSPIN SMEAR Performed at [facility], [address], [place], [name] [address]    Culture PENDING  Incomplete   Report Status PENDING  Incomplete    Radiology Studies: DG Abd 1 View Result Date: 03/13/2024 CLINICAL DATA:  Feeding tube placement EXAM: ABDOMEN - 1 VIEW COMPARISON:  Abdominal radiograph dated 03/12/2024 FINDINGS: Enteric tube tip projects over the right upper quadrant, likely within the proximal duodenum. Contrast opacification of the proximal duodenal lumen. IMPRESSION: Enteric tube tip projects over the right upper quadrant, likely within the proximal duodenum. Electronically Signed   By: Limin  Xu M.D.   On: 03/13/2024 14:47   DG Abd Portable 1V Result Date: 03/12/2024 CLINICAL DATA:  [id number] Encounter for feeding tube placement [id number] EXAM: PORTABLE ABDOMEN - 1 VIEW COMPARISON:  March 04, 2024 FINDINGS: Weighted feeding tube is coiled in the stomach. Nonobstructive bowel gas pattern. No pneumoperitoneum. No organomegaly or radiopaque calculi. The lung bases are clear. IMPRESSION: Redemonstrated weighted feeding tube, which has been retracted in the interim, now coiled in the stomach. Electronically Signed   By: Rogelia Myers M.D.   On: 03/12/2024 12:34   Scheduled Meds:  acetaminophen   650 mg Oral Q4H   Or   acetaminophen   650 mg Rectal Q4H   [START ON 03/14/2024] aspirin   81  mg Oral Daily   Chlorhexidine  Gluconate Cloth  6 each Topical Q0600   [START ON 03/14/2024] Chlorhexidine  Gluconate Cloth  6 each Topical Q0600   cycloSPORINE   200 mg Oral QHS   [START ON 03/14/2024] cycloSPORINE   225 mg Oral Daily   darbepoetin (ARANESP ) injection - DIALYSIS  60 mcg Subcutaneous Q Fri-1800   feeding supplement  237 mL Oral BID BM   feeding supplement (NEPRO CARB STEADY)  1,000 mL Per Tube Q24H   [START ON 03/14/2024] heparin  injection (subcutaneous)  5,000 Units Subcutaneous Q8H   insulin  aspart  0-9 Units Subcutaneous Q4H   lacosamide   100 mg Oral BID   metoCLOPramide  (REGLAN ) injection  5 mg Intravenous Q8H   multivitamin  1 tablet Oral QHS   mycophenolate   500 mg Oral BID   [START ON 03/14/2024] predniSONE   15 mg Oral Q breakfast   sevelamer  carbonate  2.4 g Oral TID WC   sodium bicarbonate   1,300 mg Oral TID   sodium chloride  flush  3 mL Intravenous Q12H   Continuous Infusions:   LOS: 28 days   Alejandro Marker, DO [name] Available via Epic secure chat 7am-7pm After these hours, please refer to coverage provider listed on [email or web address] 03/13/2024, 6:44 PM

## 2024-03-13 NOTE — Progress Notes (Signed)
   Providing Compassionate, Quality Care - Together    Attempted to call the patient's father to obtain consent for a lumbar puncture. There was no answer and no voicemail. An email was also sent to the email address on file for the patient's father. The patient does appear to be oriented this morning and voiced understanding when the procedure was explained. She has signed the consent form. We will move forward with the bedside lumbar puncture.    Gerard Beck, DNP, AGNP-C Nurse Practitioner 03/13/2024, 9:31 AM   [facility] [address], [address], [place], [name] [address] P: [phone]    F: [phone]

## 2024-03-13 NOTE — Progress Notes (Addendum)
 Nutrition Follow-up  DOCUMENTATION CODES:   Non-severe (moderate) malnutrition in context of chronic illness  INTERVENTION:   Post pyloric feeding tube to be placed by IR, once tube in place recommend:  Continuous tube feeding via feeding tube: Nepro at 40 ml/hr (960 ml per day)    Provides 1700 kcal, 78 gm protein, 698 ml free water  daily   Encourage PO intake House trays for now  Ensure Plus High Protein po BID, each supplement provides 350 kcal and 20 grams of protein  *Nepro is not ideal with pt's with gastroparesis due to it's high fat and fiber content, especially if it is gastric. Rena-Vite daily  If pt cannot tolerate a FLD, recommend looking into a J-tube with vented G-tube in the future.  NUTRITION DIAGNOSIS:   Moderate Malnutrition related to chronic illness (ESRD on HD/DM) as evidenced by mild fat depletion, mild muscle depletion. 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  6/11 - ED for abx for colitis 6/12 - admit for ICH; intubated with EVD; started CRRT 6/13 - s/p cortrak placement; tip gastric 6/14 - extubated  6/15 - CRRT stopped 6/16 - began iHD treatment regimen MWF 6/27- SLP therapy, allowing floor snacks of thin liquids/purees but remained NPO 6/28- recorded emesis episode, tube feeds stopped 6/30 -EVD removed. HD overnight  7/1 - s/p post pyloric cortrak placement, New CVA  7/2 - Trickle tube feeds resumed, HD AM  7/3 - Switched to VITAL 1.5, titrate tube feeds to goal  7/9 - Swicthed to [facility] d/t elevated k, cortrak coiled in stomach, emesis, pt pulled cortrak feeding  tube out, FLD 7/10 - Consult for IR to replace post pyloric feeding tube, calorie count started, s/p lumbar puncture   Pt pulled Cortrak tube last night. Per NT pt was dry heaving all night, unsure if she had anything to eat yesterday. Pt just had lumbar puncture this morning. Pt was tired but was alert and orientated and able to report she vomited 3-4 times yesterday and once this morning. States she did not have anything to eat yesterday. On FLD. Suspect pt will not be be able to tolerate much PO due to her gastroparesis and hx of vomiting.    RD's have attempted post pyloric placement of feeding tube x4 with no success. Pt will need post pyloric feeding tube replaced by IR until pt shows tolerance to FLD. MD in agreement. Pt has ben vomiting throughout this admission, pt reports even PTA she was vomiting. Pt was able to tolerate post pyloric tube feeds for 1 week before vomiting and tube being coiled in her stomach which she ultimately pulled out.    Pt has relied on a feeding tube for 29 days now, will conduct calorie count over the weekend to access tolerance of FLD. If pt does not tolerate, may have to consider J-tube for feeds with vented G-tube  Discussed with MD and RN. RN to document episodes of emesis as well.   Pt on FLD and on HD. RD will order Ensure High Protein as Nepro is not ideal with pt's with gastroparesis due  to it's high fat and fiber content. Once post pyloric feeding tube placed will continue with continuous tube feeds.   Admit weight: 59.3 kg Current weight: 55.1 kg   Average Meal Intake: No meals recorded   Intake/Output Summary (Last 24 hours) at 03/13/2024 1053 Last data filed at 03/12/2024 2200 Gross per 24 hour  Intake 3 ml  Output 3000 ml  Net -2997 ml  Emesis x 4 in 24 hours   Drains/Lines: FMS no output charted  No UOP  Nutritionally Relevant Medications: Scheduled Meds:  acetaminophen   650 mg Per Tube Q4H   Or   acetaminophen   650 mg Rectal Q4H    aspirin   81 mg Per Tube Daily   Chlorhexidine  Gluconate Cloth  6 each Topical Q0600   cycloSPORINE   200 mg Per Tube QHS   cycloSPORINE   225 mg Per Tube Daily   darbepoetin (ARANESP ) injection - DIALYSIS  60 mcg Subcutaneous Q Fri-1800   feeding supplement  237 mL Oral BID BM   feeding supplement (NEPRO CARB STEADY)  1,000 mL Per Tube Q24H   [START ON 03/14/2024] heparin  injection (subcutaneous)  5,000 Units Subcutaneous Q8H   insulin  aspart  0-9 Units Subcutaneous Q4H   lacosamide   100 mg Per Tube BID   metoCLOPramide  (REGLAN ) injection  5 mg Intravenous Q8H   multivitamin  1 tablet Oral QHS   mycophenolate   500 mg Per Tube BID   predniSONE   15 mg Per Tube Q breakfast   sevelamer  carbonate  2.4 g Per Tube TID WC   sodium bicarbonate   1,300 mg Per Tube TID   sodium chloride  flush  3 mL Intravenous Q12H   Labs Reviewed: Sodium 127 Potassium 5.8 BUN 45 Creatinine 5.63 No recent phos or Mag Total protein 6.2 GFR 9 TG 303 CBG ranges from 100-127 mg/dL over the last 24 hours HgbA1c 4.2   Diet Order:   Diet Order             Diet full liquid Room service appropriate? Yes with Assist; Fluid consistency: Thin  Diet effective now                   EDUCATION NEEDS:   No education needs have been identified at this time  Skin:  Skin Assessment: Reviewed RN Assessment  Last BM:  7/9, no output charted via FMS  Height:   Ht Readings from Last 1 Encounters:  02/14/24 5' 7.5 (1.715 m)    Weight:   Wt Readings from Last 1 Encounters:  03/12/24 55.1 kg    Ideal Body Weight:  61.4 kg  BMI:  Body mass index is 18.74 kg/m.  Estimated Nutritional Needs:   Kcal:  1800-2000  Protein:  75-90 grams  Fluid:  1L + UOP   Olivia Kenning, RD Registered Dietitian  See Amion for more information

## 2024-03-13 NOTE — Progress Notes (Signed)
 Speech Language Pathology Treatment: Cognitive-Linguistic  Patient Details Name: [name] [name] MRN: [id number] DOB: Jul 31, 1981 Today's Date: 03/13/2024 Time: 8887-8876 SLP Time Calculation (min) (ACUTE ONLY): 11 min  Assessment / Plan / Recommendation Clinical Impression  Confirmed with RN that pt could be positioned upright after LP performed earlier this date. Pt was increasingly lethargic today but roused with stimuli. Pt had two episodes of emesis after sitting up, both small in volume but PO trials were deferred and RN was notified. She continues to present with impaired attention, awareness, and reasoning. She was unable to reorient herself to time today but nausea is suspected to have impacted her performance. SLP will continue following.    HPI HPI: Patient is a 43 y.o. female with PMH: ESRD on HD, s/p renal and pancreatic transplant, DM-1 with gastroparesis, anxiety, narcotic abuse, vision loss, non-compliance with medical treatment. She presented to the hospital on 02/14/24 with nausea and vomiting. She was admitted with possible sepsis secondary to colitis, acute respiratory failure with hypoxia, acute metabolic encephalopathy. 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 CRRT 6/13-15, EVD placed on 6/12; removed 6/30. She was emergently intubated on 6/12 and extubated on 6/14.      SLP Plan  Continue with current plan of care          Recommendations  Diet recommendations: Thin liquid (full liquid diet) Liquids provided via: Teaspoon;Cup;Straw Medication Administration: Whole meds with liquid Supervision: Staff to assist with self feeding;Full supervision/cueing for compensatory strategies Compensations: Minimize environmental distractions;Slow rate;Small sips/bites Postural Changes and/or Swallow Maneuvers: Seated upright 90 degrees;Upright 30-60 min after meal                  Oral care QID   Frequent or constant Supervision/Assistance Dysphagia,  unspecified (R13.10);Cognitive communication deficit (M58.158)     Continue with current plan of care     Damien Blumenthal, M.A., CCC-SLP Speech Language Pathology, Acute Rehabilitation Services  Secure Chat preferred [phone]   03/13/2024, 11:48 AM

## 2024-03-13 NOTE — Progress Notes (Signed)
 OT Cancellation Note  Patient Details Name: [name] [name] MRN: [id number] DOB: 1980-12-11   Cancelled Treatment:    Reason Eval/Treat Not Completed: Active bedrest order.  BR x6 hours s/p LP. Will continue efforts tomorrow.   Pattricia Weiher D Jamisen Hawes 03/13/2024, 1:41 PM 03/13/2024  RP, OTR/L  Acute Rehabilitation Services  Office:  [phone]

## 2024-03-13 NOTE — Progress Notes (Signed)
 [facility] Tube Team Note:  Consult received to place a bridle on a small bore tube placed by fluoroscopy team. Bridle placed without incident. Tube left at 110 cm in left nare.    [name] [name], RD Registered Dietitian  See Amion for more information

## 2024-03-13 NOTE — Progress Notes (Addendum)
 Subjective: The patient is alert and pleasant.  She is in no apparent distress.  Objective: Vital signs in last 24 hours: Temp:  [97.7 F (36.5 C)-98.5 F (36.9 C)] 98.5 F (36.9 C) (07/10 0316) Pulse Rate:  [87-112] 112 (07/10 0316) Resp:  [10-20] 19 (07/10 0400) BP: (99-204)/(54-92) 136/79 (07/10 0400) SpO2:  [92 %-100 %] 100 % (07/09 2000) Weight:  [55.1 kg-55.7 kg] 55.1 kg (07/09 1800) Estimated body mass index is 18.74 kg/m as calculated from the following:   Height as of this encounter: 5' 7.5 (1.715 m).   Weight as of this encounter: 55.1 kg.   Intake/Output from previous day: 07/09 0701 - 07/10 0700 In: 3 [I.V.:3] Out: 3000  Intake/Output this shift: No intake/output data recorded.  Physical exam patient is alert and oriented x 2, person and [facility].  She asks and answers questions appropriately.  She is moving all 4 extremities well.  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  Lab Results: Recent Labs    03/12/24 0512  WBC 4.3  HGB 9.1*  HCT 27.6*  PLT 169   BMET Recent Labs    03/11/24 0643 03/12/24 0512  NA 132* 127*  K 5.6* 5.8*  CL 88* 83*  CO2 26 29  GLUCOSE 124* 107*  BUN 33* 45*  CREATININE 4.36* 5.63*  CALCIUM  10.0 9.7    Studies/Results: DG Abd Portable 1V Result Date: 03/12/2024 CLINICAL DATA:  [id number] Encounter for feeding tube placement [id number] EXAM: PORTABLE ABDOMEN - 1 VIEW COMPARISON:  March 04, 2024 FINDINGS: Weighted feeding tube is coiled in the stomach. Nonobstructive bowel gas pattern. No pneumoperitoneum. No organomegaly or radiopaque calculi. The lung bases are clear. IMPRESSION: Redemonstrated weighted feeding tube, which has been retracted in the interim, now coiled in the stomach. Electronically Signed   By: Rogelia Myers M.D.   On: 03/12/2024 12:34    Assessment/Plan: Ventriculomegaly/communicating hydrocephalus: We have had multiple unsuccessful attempts to contact her family to discuss a lumbar puncture  and possible ventriculoperitoneal shunt.  I discussed the lumbar puncture with the patient to measure opening pressures and rule out meningitis.  We discussed the risks benefits and alternatives.  She agrees to proceed with the procedure.  Will plan to do this later on today.  LOS: 28 days     [name] [name] 03/13/2024, 7:30 AM     Patient ID: [name] [name], female   DOB: 10/22/1980, 43 y.o.   MRN: [id number]

## 2024-03-13 NOTE — Progress Notes (Signed)
 [facility] Progress Note  Subjective:  Seen in room NG tube out LP done bedside, low pressures   Vitals:   03/13/24 0000 03/13/24 0316 03/13/24 0400 03/13/24 0830  BP: 114/61 (!) 140/68 136/79 (!) 144/72  Pulse:  (!) 112  (!) 110  Resp: 20 18 19 17   Temp:  98.5 F (36.9 C)  99 F (37.2 C)  TempSrc:  Axillary  Axillary  SpO2:    96%  Weight:      Height:        Exam: General: Lying in bed, no distress Heart: Normal rate, murmur present, no rub Lungs: bilateral chest rise, no increased work of breathing Abdomen: soft, nontender Extremities: No lower extremity edema  Dialysis Access:  RUE AVF +t/b   OP HD:  MWF AF 3h  B400   57.2kg  2K   AVF  Heparin  none - Hectoral 4mcg IV q HD - Mircera 150mcg IV q 2 weeks (last given 6/9 -> due 6/23)   Assessment/Plan: Intracerebral hemorrhage: C/b hydrocephalus, 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 TCDs with evidence of vasospasm 02/11/24. Followed closely by neurosurgery, evd removed 6/30 CVA: New CVA found on 7/1 ESRD: MWF HD. S/p CRRT 6/12-6/15, then iHD. Next HD tomorrow.  Hyperkalemia: changed TFs to Nepro, better Volume: no vol excess on exam, UF 2 L next HD HTN: bp's good today, 140/80 range, not on any bp meds Anemia of ESRD: Hgb 8-10 here.Getting darbe at 60 micrograms sq weekly. Iron  adequate Secondary HPTH: Ca and Phos high, VDRA stopped 6/21, added sevelamer  via tube. PTH 115.  Could consider low-dose Sensipar .  Continue to monitor for now because hypercalcemia likely a minimal problem at this point.  Pancreas/ kidney transplant (2014): for the pancreas is on home dosing of CyA 225mg  qam and 200 mg qpm + Cellcept  and prednisone .     Myer Fret MD  [facility] 03/13/2024, 11:28 AM  Recent Labs  Lab 03/10/24 0222 03/10/24 1100 03/12/24 0512 03/13/24 0922  HGB 10.1*  --  9.1* 10.1*  ALBUMIN 2.9*  --   --  3.3*  CALCIUM  10.6*   < > 9.7 10.7*  PHOS  --   --   --  6.3*  CREATININE 6.78*   < >  5.63* 4.13*  K 6.6*   < > 5.8* 4.8   < > = values in this interval not displayed.   No results for input(s): IRON , TIBC, FERRITIN in the last 168 hours. Inpatient medications:  acetaminophen   650 mg Per Tube Q4H   Or   acetaminophen   650 mg Rectal Q4H   aspirin   81 mg Per Tube Daily   Chlorhexidine  Gluconate Cloth  6 each Topical Q0600   cycloSPORINE   200 mg Per Tube QHS   cycloSPORINE   225 mg Per Tube Daily   darbepoetin (ARANESP ) injection - DIALYSIS  60 mcg Subcutaneous Q Fri-1800   feeding supplement  237 mL Oral BID BM   feeding supplement (NEPRO CARB STEADY)  1,000 mL Per Tube Q24H   [START ON 03/14/2024] heparin  injection (subcutaneous)  5,000 Units Subcutaneous Q8H   insulin  aspart  0-9 Units Subcutaneous Q4H   lacosamide   100 mg Per Tube BID   metoCLOPramide  (REGLAN ) injection  5 mg Intravenous Q8H   multivitamin  1 tablet Oral QHS   mycophenolate   500 mg Per Tube BID   predniSONE   15 mg Per Tube Q breakfast   sevelamer  carbonate  2.4 g Per Tube TID WC  sodium bicarbonate   1,300 mg Per Tube TID   sodium chloride  flush  3 mL Intravenous Q12H     albuterol , artificial tears, atropine , butalbital -acetaminophen -caffeine , Gerhardt's butt cream, hydrALAZINE , HYDROmorphone  (DILAUDID ) injection, iohexol , labetalol , loperamide  HCl, naLOXone  (NARCAN )  injection, ondansetron  (ZOFRAN ) IV, mouth rinse, simethicone 

## 2024-03-13 NOTE — Op Note (Signed)
 Brief history: This patient is a 44 year old female who presented with a subarachnoid hemorrhage.  She has had persistent ventriculomegaly.  I recommended a spinal tap to rule out meningitis and measure her spinal fluid pressure.  The patient has consented.  Procedure: Lumbar puncture  Description of procedure: The patient's lumbar region was repaired with Betadine solution.  Sterile drapes were applied.  Lidocaine  was injected.  A spinal needle was placed into the approximately L3-4 interspace.  The stylette was removed.  Spinal fluid was obtained under very low pressure, even after adjustment of the needle.  In fact it was too low to measure on the manometer.  CSF specimens were obtained.  The needle was withdrawn.  A Band-Aid was placed.  The drapes were removed.  The patient tolerated the procedure well.

## 2024-03-13 NOTE — Progress Notes (Signed)
 PT Cancellation Note  Patient Details Name: [name] [name] MRN: [id number] DOB: [birth year]   Cancelled Treatment:    Reason Eval/Treat Not Completed: Active bedrest order - pt on BR x6 hours s/p LP. Will not be able to see today given length of bedrest, will check back tomorrow.   Jamaine Quintin S, PT DPT Acute Rehabilitation Services Secure Chat Preferred  Office [phone]    Stephie Xu E Stroup 03/13/2024, 1:19 PM

## 2024-03-14 ENCOUNTER — Inpatient Hospital Stay (HOSPITAL_COMMUNITY)

## 2024-03-14 DIAGNOSIS — Z8639 Personal history of other endocrine, nutritional and metabolic disease: Secondary | ICD-10-CM | POA: Diagnosis not present

## 2024-03-14 DIAGNOSIS — K529 Noninfective gastroenteritis and colitis, unspecified: Secondary | ICD-10-CM | POA: Diagnosis not present

## 2024-03-14 DIAGNOSIS — I609 Nontraumatic subarachnoid hemorrhage, unspecified: Secondary | ICD-10-CM | POA: Diagnosis not present

## 2024-03-14 DIAGNOSIS — I160 Hypertensive urgency: Secondary | ICD-10-CM | POA: Diagnosis not present

## 2024-03-14 LAB — GLUCOSE, CAPILLARY
Glucose-Capillary: 104 mg/dL — ABNORMAL HIGH (ref 70–99)
Glucose-Capillary: 101 mg/dL — ABNORMAL HIGH (ref 70–99)
Glucose-Capillary: 108 mg/dL — ABNORMAL HIGH (ref 70–99)
Glucose-Capillary: 107 mg/dL — ABNORMAL HIGH (ref 70–99)
Glucose-Capillary: 119 mg/dL — ABNORMAL HIGH (ref 70–99)

## 2024-03-14 LAB — COMPREHENSIVE METABOLIC PANEL WITH GFR
ALT: 12 U/L (ref 0–44)
AST: 25 U/L (ref 15–41)
Albumin: 3.2 g/dL — ABNORMAL LOW (ref 3.5–5.0)
Alkaline Phosphatase: 106 U/L (ref 38–126)
Anion gap: 19 — ABNORMAL HIGH (ref 5–15)
BUN: 35 mg/dL — ABNORMAL HIGH (ref 6–20)
CO2: 29 mmol/L (ref 22–32)
Calcium: 10.4 mg/dL — ABNORMAL HIGH (ref 8.9–10.3)
Chloride: 86 mmol/L — ABNORMAL LOW (ref 98–111)
Creatinine, Ser: 5.5 mg/dL — ABNORMAL HIGH (ref 0.44–1.00)
GFR, Estimated: 9 mL/min — ABNORMAL LOW (ref >60)
Glucose, Bld: 107 mg/dL — ABNORMAL HIGH (ref 70–99)
Potassium: 4.5 mmol/L (ref 3.5–5.1)
Sodium: 134 mmol/L — ABNORMAL LOW (ref 135–145)
Total Bilirubin: 0.6 mg/dL (ref 0.0–1.2)
Total Protein: 7 g/dL (ref 6.5–8.1)

## 2024-03-14 LAB — CBC WITH DIFFERENTIAL/PLATELET
Abs Immature Granulocytes: 0.02 K/uL (ref 0.00–0.07)
Basophils Absolute: 0 K/uL (ref 0.0–0.1)
Basophils Relative: 0 %
Eosinophils Absolute: 0.1 K/uL (ref 0.0–0.5)
Eosinophils Relative: 2 %
HCT: 27.6 % — ABNORMAL LOW (ref 36.0–46.0)
Hemoglobin: 9 g/dL — ABNORMAL LOW (ref 12.0–15.0)
Immature Granulocytes: 0 %
Lymphocytes Relative: 13 %
Lymphs Abs: 1.1 K/uL (ref 0.7–4.0)
MCH: 32 pg (ref 26.0–34.0)
MCHC: 32.6 g/dL (ref 30.0–36.0)
MCV: 98.2 fL (ref 80.0–100.0)
Monocytes Absolute: 0.8 K/uL (ref 0.1–1.0)
Monocytes Relative: 9 %
Neutro Abs: 6.4 K/uL (ref 1.7–7.7)
Neutrophils Relative %: 76 %
Platelets: 202 K/uL (ref 150–400)
RBC: 2.81 MIL/uL — ABNORMAL LOW (ref 3.87–5.11)
RDW: 15.8 % — ABNORMAL HIGH (ref 11.5–15.5)
WBC: 8.4 K/uL (ref 4.0–10.5)
nRBC: 0 % (ref 0.0–0.2)

## 2024-03-14 LAB — MAGNESIUM: Magnesium: 3 mg/dL — ABNORMAL HIGH (ref 1.7–2.4)

## 2024-03-14 LAB — PHOSPHORUS: Phosphorus: 6.6 mg/dL — ABNORMAL HIGH (ref 2.5–4.6)

## 2024-03-14 LAB — CYCLOSPORINE: Cyclosporine, LabCorp: 109 ng/mL (ref 100–400)

## 2024-03-14 MED ORDER — IOPAMIDOL (ISOVUE-300) INJECTION 61%: 10.0000 mL | Freq: Once | INTRAVENOUS | Status: DC | PRN

## 2024-03-14 MED ORDER — PANCRELIPASE (LIP-PROT-AMYL) 10440-39150 UNITS PO TABS
20880.0000 [IU] | ORAL_TABLET | Freq: Once | ORAL | Status: AC
  Administered 2024-03-14: 20880 [IU]
  Filled 2024-03-14 (×2): qty 2

## 2024-03-14 MED ORDER — PENTAFLUOROPROP-TETRAFLUOROETH EX AERO
INHALATION_SPRAY | CUTANEOUS | Status: AC
  Filled 2024-03-14: qty 30

## 2024-03-14 MED ORDER — IOHEXOL 300 MG/ML  SOLN
10.0000 mL | Freq: Once | INTRAMUSCULAR | Status: AC | PRN
  Administered 2024-03-14: 10 mL

## 2024-03-14 MED ORDER — HYDROMORPHONE HCL 1 MG/ML IJ SOLN
INTRAMUSCULAR | Status: AC
  Filled 2024-03-14: qty 0.5

## 2024-03-14 MED ORDER — LIDOCAINE VISCOUS HCL 2 % MT SOLN
10.0000 mL | Freq: Once | OROMUCOSAL | Status: AC
  Administered 2024-03-14: 10 mL via OROMUCOSAL

## 2024-03-14 MED ORDER — SODIUM BICARBONATE 650 MG PO TABS
650.0000 mg | ORAL_TABLET | Freq: Once | ORAL | Status: AC
  Administered 2024-03-14: 650 mg
  Filled 2024-03-14: qty 1

## 2024-03-14 MED ORDER — LIDOCAINE HCL (PF) 1 % IJ SOLN
5.0000 mL | INTRAMUSCULAR | Status: DC | PRN
Start: 2024-03-14 — End: 2024-03-14

## 2024-03-14 MED ORDER — ANTICOAGULANT SODIUM CITRATE 4% (200MG/5ML) IV SOLN
5.0000 mL | Status: DC | PRN
Start: 2024-03-14 — End: 2024-03-14

## 2024-03-14 MED ORDER — LIDOCAINE-PRILOCAINE 2.5-2.5 % EX CREA
1.0000 | TOPICAL_CREAM | CUTANEOUS | Status: DC | PRN
Start: 2024-03-14 — End: 2024-03-14

## 2024-03-14 MED ORDER — NEPRO/CARBSTEADY PO LIQD: 237.0000 mL | ORAL | Status: DC | PRN

## 2024-03-14 MED ORDER — ACETAMINOPHEN 325 MG PO TABS
ORAL_TABLET | ORAL | Status: AC
  Filled 2024-03-14: qty 1

## 2024-03-14 MED ORDER — ALTEPLASE 2 MG IJ SOLR: 2.0000 mg | Freq: Once | INTRAMUSCULAR | Status: DC | PRN

## 2024-03-14 MED ORDER — HEPARIN SODIUM (PORCINE) 1000 UNIT/ML DIALYSIS
1000.0000 [IU] | INTRAMUSCULAR | Status: DC | PRN
Start: 2024-03-14 — End: 2024-03-14

## 2024-03-14 MED ORDER — PENTAFLUOROPROP-TETRAFLUOROETH EX AERO
1.0000 | INHALATION_SPRAY | CUTANEOUS | Status: DC | PRN
Start: 2024-03-14 — End: 2024-03-14

## 2024-03-14 NOTE — Progress Notes (Signed)
 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 Hypertensive in ED SBP 240s.  CT scan suggested colitis.  Patient's mentation continued to worsen. CT head was subsequently done which revealed intracranial hemorrhage.  Patient was admitted to the ICU.     Consultants: Critical care medicine.  Neurosurgery.  Neurology.  Nephrology   Significant Hospital Events:  6/11 ED w n/v AMS. Abx IVF for colitis 6/12 Admit. AMS decr LOC. Incr O2 req. PCCM consult and code stroke called seen w rapid response nephro NP then neuro  6/13 Intubated with EVD, responding to commands  6/14 Extubated 6/17 acute L cerebellar infarct. Keppra  dc  6/18 Dialysis, fevering, EVD at 10 (129 mL last 24 hrs) 6/19 Narcotic dependence a driving factor. +Vasospasm on TCD. CTA with evolving Left cerebellar infarct, no HT or mass effect, substantially decreased caliber of multiple intracranial arteries and bilateral Circle-of[place] branches (especially: Distal left V4, supraclinoid ICAs, MCA and ACA origins), appearance c/w widespread Vasospasm, Vasoconstriction. No associated large vessel occlusion. 6/21 CSF sampline from EVD -- WBC 244 RBC 19000, cx ngtd  6/25: diagnostic angio today with nsgy - negative 6/26 evd leaking when clamped  6/30 evd removed, HD overnight, HTN improved, 7 days cefazolin  complete 7/1 more lethargic> minimal response to narcan  x2, oxy stopped, EEG neg, MRI - new small left parietal cva, cortrak placed postpyloric, vagal episodes with N/V 7/2 iHD, vimpat  started 7/9 she appears agitated.  IVs were removed.  LP to be initiated to evaluate her opening pressure and to ensure that she does not have meningitis if this is still pending to be done. 7/10 LP  done at bedside by Neurosurgery. Cortrak was removed yesterday evening and replaced by Fluoro team today.  7/11 Cortrak obstructed and reomved and had to be replaced by Flouro. Calorie Count initiated. Undergoing Dialysis today    Assessment and Plan:  Intracranial hemorrhage with brain compression/subarachnoid hemorrhage/communicating hydrocephalus -Patient seen by neurology and neurosurgery.   -Underwent placement of right frontal pressure ventriculostomy via bur hole. -Ventriculostomy was removed on 6/30. -Seems to be stable from neurological standpoint except for headaches.  Continue scheduled Tylenol  and as needed Fioricet .  Also noted to be on as needed morphine .  Could add as needed oxycodone . -Underwent CT head the day before yesterday as ordered by neurosurgery.  This raised concern for a new infarct.  Subsequently MRI brain was done.  This showed small foci of hemorrhage in the posterior left cerebellum without evidence for acute infarct.  Punctate focus of acute infarct involving the medial left occipital cortex was seen.  Evolving subacute infarct in the left parietal lobe seen.  Trace blood products were noted. -Seen by neurosurgery and they are concerned about the hydrocephalus.  They have ordered LP to measure opening pressure and to ensure that she does not have meningitis with her prolonged intraventricular catheter..  IR was consulted to do this but the IR team advised that typically image guided LP is reserved for those who can and have failed attempts on the floor. Neurosurgery now doing LP bedside today and needle placed into the L3-4 interspace.  -The neurosurgical team feels that she would perhaps benefit from a ventriculoperitoneal shunt but want to rule out meningitis and measure her spinal  fluid pressure first and they were low and not indicative of Meningitis. NSGY does not believe she would benefit from a Shunt.    Concern for seizure activity: Seen by neurology.  Underwent  EEG.  Patient was started on Lacosamide  100 mg po BID.  Improvement noted.   Vasospasm with left cerebellar infarct: Seen by neurology.  Noted to be on aspirin . LDL 63.  HbA1c 4.2. Patient noted to be on midodrine  for BP augmentation but now due persistent hypotension midodrine  has been discontinued. PT and OT following.  SNF being pursued. -A small punctate focus of acute infarct was noted in the medial left occipital cortex.  Per neurosurgery there is nothing to be done for that.  Continue current management.  Will run this by neurology as well.   Essential Hypertension: Midodrine  was discontinued.  Blood pressure did drop with hemodialysis but now elevated.  Pain could be contributing to high readings.  She seems to have quite brittle blood pressure.  Would like to avoid antihypertensives as much as possible.  Continue as needed medications for now. CTM BP per Protocol. Last BP reading was 134/75   End-stage renal disease on hemodialysis/Hyperkalemia: History of failed transplant is present.  She is chronically immunosuppressed with CellCept , Cyclosporine , and Prednisone  BUN/Cr Trend: Recent Labs  Lab 03/06/24 0508 03/08/24 0338 03/10/24 0222 03/11/24 0643 03/12/24 0512 03/13/24 0922 03/14/24 0545  BUN 25* 29* 60* 33* 45* 25* 35*  CREATININE 3.93* 4.04* 6.78* 4.36* 5.63* 4.13* 5.50*  -Further management Management per nephrology. Patient is on sodium bicarbonate . K+ Trend is improving:  Recent Labs  Lab 03/08/24 0338 03/10/24 0222 03/10/24 1100 03/11/24 0643 03/12/24 0512 03/13/24 0922 03/14/24 0545  K 4.8 6.6* 4.4 5.6* 5.8* 4.8 4.5  -Potassium level improved with hemodialysis. Nephrology changed her tube feedings to Nepro and dialyzed the patient 7/9 and she is undergoing Dialysis on 7/11 -Avoid Nephrotoxic Medications, Contrast Dyes, Hypotension and Dehydration to Ensure Adequate Renal Perfusion and will need to Renally Adjust Meds -Continue to Monitor and Trend Renal  Function carefully and repeat CMP in the AM   Bradycardia: Secondary to vagal response. Resolved.   Oropharyngeal dysphagia: Noted to have Cortak feeding tube but was removed overnight.  Dietitian following.  Last seen by speech therapy on 7/2.  Patient's mentation has improved.  SLP evaluated and recommending FULL Liquid Diet.  The dietitian has recommended feeding tube to be placed postpyloric to avoid emesis  and this was done. If she can tolerate a full liquid diet will see how well her nutritional intake is. If continues to have N/V the dietitians are recommending discussing with her about with a venting G-tube in the future.  Will see how she does with the full liquid diet for now and replace Cortrak via Fluoro team.  -KUB done yesterday AM showed Redemonstrated weighted feeding tube, which has been retracted in  the interim, now coiled in the stomach.   Colitis: Stable.  Nausea/Vomiting/Gastroparesis: Was going to Increase Metoclopramide  to 10 mg q8h however contraindicated due to her ESRD. C/w Supportive care and c/w Antiemetics w. Ondansetron  IV 4 mg q6hprn.     Status post Pancreatic Transplant/Diabetes: C/w Sensitive Novology q4h. CTM CBGs.  CBGs ranging from 100-123   Anemia of Chronic Disease/ Normocytic Anemia: Relatively Stable. Hgb/Hct Trend:  Recent Labs  Lab 03/05/24 0459 03/06/24 9491 03/08/24 9661 03/10/24 0222 03/12/24 0512 03/13/24 0922 03/14/24 0545  HGB 8.8* 9.8* 9.5* 10.1* 9.1* 10.1* 9.0*  HCT 27.1* 29.4* 28.6* 30.7* 27.6*  30.7* 27.6*  MCV 94.8 95.8 97.9 97.2 95.8 96.8 98.2  -Getting Darbepoetin Alfa  60 mcg sq weekly -CTM for S/Sx of Bleeding; No overt bleeding noted. Repeat CBC in the AM    Moderate Protein Calorie Malnutrition / Underweight: Nutrition Status: Nutrition Problem: Moderate Malnutrition Etiology: chronic illness (ESRD on HD/DM) Signs/Symptoms: mild fat depletion, mild muscle depletion Interventions: Tube feeding Estimated body mass index is  16.53 kg/m as calculated from the following:   Height as of this encounter: 5' 7.5 (1.715 m).   Weight as of this encounter: 48.6 kg. -Calorie Count initiated.    DVT prophylaxis: heparin  injection 5,000 Units Start: 03/14/24 0600 SCDs Start: 02/14/24 0725    Code Status: Full Code Family Communication: No family present 2 bedside  Disposition Plan:  Level of care: Progressive Status is: Inpatient Remains inpatient appropriate because: Needs further clinical improvement and tolerance of diet   Consultants:  Nephrology Neurosurgery Neurology Interventional Radiology  Procedures:  As delineated as above  Antimicrobials:  Anti-infectives (From admission, onward)    Start     Dose/Rate Route Frequency Ordered Stop   02/27/24 1200  vancomycin  (VANCOREADY) IVPB 500 mg/100 mL  Status:  Discontinued        500 mg 100 mL/hr over 60 Minutes Intravenous Every M-W-F (Hemodialysis) 02/25/24 1129 02/25/24 1436   02/26/24 2200  ceFAZolin  (ANCEF ) IVPB 1 g/50 mL premix        1 g 100 mL/hr over 30 Minutes Intravenous Every 24 hours 02/26/24 1009 03/03/24 2140   02/25/24 1530  vancomycin  (VANCOREADY) IVPB 500 mg/100 mL        500 mg 100 mL/hr over 60 Minutes Intravenous Every M-W-F (Hemodialysis) 02/25/24 1436 02/25/24 1934   02/23/24 1800  meropenem  (MERREM ) 1 g in sodium chloride  0.9 % 100 mL IVPB  Status:  Discontinued        1 g 200 mL/hr over 30 Minutes Intravenous Every 24 hours 02/23/24 1625 02/26/24 1009   02/23/24 1800  vancomycin  (VANCOREADY) IVPB 1250 mg/250 mL        1,250 mg 166.7 mL/hr over 90 Minutes Intravenous  Once 02/23/24 1625 02/23/24 1933   02/17/24 2200  piperacillin -tazobactam (ZOSYN ) IVPB 2.25 g        2.25 g 100 mL/hr over 30 Minutes Intravenous Every 8 hours 02/17/24 1412 02/21/24 2336   02/14/24 1800  vancomycin  (VANCOREADY) IVPB 2000 mg/400 mL        2,000 mg 200 mL/hr over 120 Minutes Intravenous  Once 02/14/24 1709 02/14/24 2048   02/14/24 1622   piperacillin -tazobactam (ZOSYN ) IVPB 3.375 g  Status:  Discontinued        3.375 g 100 mL/hr over 30 Minutes Intravenous Every 6 hours 02/14/24 1622 02/17/24 1412   02/14/24 1545  piperacillin -tazobactam (ZOSYN ) IVPB 3.375 g  Status:  Discontinued        3.375 g 12.5 mL/hr over 240 Minutes Intravenous Every 8 hours 02/14/24 1451 02/14/24 1453   02/14/24 1545  piperacillin -tazobactam (ZOSYN ) IVPB 2.25 g  Status:  Discontinued        2.25 g 100 mL/hr over 30 Minutes Intravenous Every 8 hours 02/14/24 1453 02/14/24 1622   02/14/24 0800  cefTRIAXone  (ROCEPHIN ) 2 g in sodium chloride  0.9 % 100 mL IVPB  Status:  Discontinued        2 g 200 mL/hr over 30 Minutes Intravenous Every 24 hours 02/14/24 0730 02/14/24 1451   02/14/24 0800  metroNIDAZOLE  (FLAGYL ) IVPB 500 mg  Status:  Discontinued  500 mg 100 mL/hr over 60 Minutes Intravenous Every 12 hours 02/14/24 0730 02/14/24 1451       Subjective: Seen and examined had some nausea and vomiting earlier but was doing okay now.  Denied any lightheadedness or dizziness.  Wanting to rest.  No other concerns or complaints at this time.  Objective: Vitals:   03/14/24 1730 03/14/24 1750 03/14/24 1755 03/14/24 1820  BP: 106/69 134/75  (!) 156/69  Pulse: (!) 104 97  (!) 105  Resp: 15 13  16   Temp: 98.7 F (37.1 C)   98 F (36.7 C)  TempSrc: Oral   Axillary  SpO2: 99% 100%  100%  Weight:   48.6 kg   Height:        Intake/Output Summary (Last 24 hours) at 03/14/2024 1937 Last data filed at 03/14/2024 1730 Gross per 24 hour  Intake --  Output 1400 ml  Net -1400 ml   Filed Weights   03/14/24 0346 03/14/24 1440 03/14/24 1755  Weight: 52.4 kg 50 kg 48.6 kg   Examination: Physical Exam:  Constitutional: WN/WD chronically ill-appearing female who appears calm and resting Respiratory: Diminished to auscultation bilaterally, no wheezing, rales, rhonchi or crackles. Normal respiratory effort and patient is not tachypenic. No accessory muscle  use.  Unlabored breathing Cardiovascular: RRR, no murmurs / rubs / gallops. S1 and S2 auscultated. No extremity edema.  Abdomen: Soft, non-tender, non-distended. Bowel sounds positive.  GU: Deferred. Musculoskeletal: No clubbing / cyanosis of digits/nails. No joint deformity upper and lower extremities Skin: No rashes, lesions, ulcers on limited skin evaluation. No induration; Warm and dry.  Neurologic: Resting but easily interactive.  Moves extremities independently Psychiatric: Little confused  Data Reviewed: I have personally reviewed following labs and imaging studies  CBC: Recent Labs  Lab 03/08/24 0338 03/10/24 0222 03/12/24 0512 03/13/24 0922 03/14/24 0545  WBC 8.5 6.4 4.3 9.8 8.4  NEUTROABS  --   --   --  7.8* 6.4  HGB 9.5* 10.1* 9.1* 10.1* 9.0*  HCT 28.6* 30.7* 27.6* 30.7* 27.6*  MCV 97.9 97.2 95.8 96.8 98.2  PLT 230 213 169 230 202   Basic Metabolic Panel: Recent Labs  Lab 03/10/24 0222 03/10/24 1100 03/11/24 0643 03/12/24 0512 03/13/24 0922 03/14/24 0545  NA 129*  --  132* 127* 137 134*  K 6.6* 4.4 5.6* 5.8* 4.8 4.5  CL 82*  --  88* 83* 87* 86*  CO2 27  --  26 29 32 29  GLUCOSE 123*  --  124* 107* 118* 107*  BUN 60*  --  33* 45* 25* 35*  CREATININE 6.78*  --  4.36* 5.63* 4.13* 5.50*  CALCIUM  10.6*  --  10.0 9.7 10.7* 10.4*  MG  --   --   --   --  2.9* 3.0*  PHOS  --   --   --   --  6.3* 6.6*   GFR: Estimated Creatinine Clearance: 10.2 mL/min (A) (by C-G formula based on SCr of 5.5 mg/dL (H)). Liver Function Tests: Recent Labs  Lab 03/08/24 0338 03/10/24 0222 03/13/24 0922 03/14/24 0545  AST 19 29 21 25   ALT 5 9 12 12   ALKPHOS 81 109 98 106  BILITOT 0.2 0.7 0.6 0.6  PROT 6.2* 6.2* 7.1 7.0  ALBUMIN 2.9* 2.9* 3.3* 3.2*   No results for input(s): LIPASE, AMYLASE in the last 168 hours. No results for input(s): AMMONIA in the last 168 hours. Coagulation Profile: No results for input(s): INR, PROTIME in the last 168  hours. Cardiac  Enzymes: No results for input(s): CKTOTAL, CKMB, CKMBINDEX, TROPONINI in the last 168 hours. BNP (last 3 results) No results for input(s): PROBNP in the last 8760 hours. HbA1C: No results for input(s): HGBA1C in the last 72 hours. CBG: Recent Labs  Lab 03/13/24 2006 03/13/24 2324 03/14/24 0349 03/14/24 0756 03/14/24 1207  GLUCAP 84 99 104* 101* 108*   Lipid Profile: No results for input(s): CHOL, HDL, LDLCALC, TRIG, CHOLHDL, LDLDIRECT in the last 72 hours. Thyroid Function Tests: No results for input(s): TSH, T4TOTAL, FREET4, T3FREE, THYROIDAB in the last 72 hours. Anemia Panel: No results for input(s): VITAMINB12, FOLATE, FERRITIN, TIBC, IRON , RETICCTPCT in the last 72 hours. Sepsis Labs: No results for input(s): PROCALCITON, LATICACIDVEN in the last 168 hours.  Recent Results (from the past 240 hours)  CSF culture w Gram Stain     Status: None (Preliminary result)   Collection Time: 03/13/24 10:40 AM   Specimen: Back; Cerebrospinal Fluid  Result Value Ref Range Status   Specimen Description BACK  Final   Special Requests NONE  Final   Gram Stain NO WBC SEEN NO ORGANISMS SEEN CYTOSPIN SMEAR   Final   Culture   Final    NO GROWTH 1 DAY Performed at [facility], [address] [place], [name] [address]    Report Status PENDING  Incomplete     Radiology Studies: DG Abd 1 View Result Date: 03/14/2024 CLINICAL DATA:  Feeding tube placement EXAM: ABDOMEN - 1 VIEW FLUOROSCOPY TIME:  Fluoroscopy Time:  42 seconds Radiation Exposure Index (if provided by the fluoroscopic device): 1.6 mGy Number of Acquired Spot Images: 1 COMPARISON:  Radiograph 03/13/2024 FINDINGS: Enteric tube tip in the third portion of the duodenum. Instilled enteric contrast is present in the descending duodenum. IMPRESSION: Enteric tube tip in the third portion of the duodenum. Electronically Signed   By: Norman Gatlin M.D.   On: 03/14/2024 18:05    DG Abd 1 View Result Date: 03/13/2024 CLINICAL DATA:  Feeding tube placement EXAM: ABDOMEN - 1 VIEW COMPARISON:  Abdominal radiograph dated 03/12/2024 FINDINGS: Enteric tube tip projects over the right upper quadrant, likely within the proximal duodenum. Contrast opacification of the proximal duodenal lumen. IMPRESSION: Enteric tube tip projects over the right upper quadrant, likely within the proximal duodenum. Electronically Signed   By: Limin  Xu M.D.   On: 03/13/2024 14:47   Scheduled Meds:  acetaminophen   650 mg Oral Q4H   Or   acetaminophen   650 mg Rectal Q4H   aspirin   81 mg Oral Daily   Chlorhexidine  Gluconate Cloth  6 each Topical Q0600   Chlorhexidine  Gluconate Cloth  6 each Topical Q0600   cycloSPORINE   200 mg Oral QHS   cycloSPORINE   225 mg Oral Daily   darbepoetin (ARANESP ) injection - DIALYSIS  60 mcg Subcutaneous Q Fri-1800   feeding supplement  237 mL Oral BID BM   feeding supplement (NEPRO CARB STEADY)  1,000 mL Per Tube Q24H   heparin  injection (subcutaneous)  5,000 Units Subcutaneous Q8H   insulin  aspart  0-9 Units Subcutaneous Q4H   lacosamide   100 mg Oral BID   metoCLOPramide  (REGLAN ) injection  5 mg Intravenous Q8H   multivitamin  1 tablet Oral QHS   mycophenolate   500 mg Oral BID   predniSONE   15 mg Oral Q breakfast   sevelamer  carbonate  2.4 g Oral TID WC   sodium bicarbonate   1,300 mg Oral TID   sodium chloride  flush  3  mL Intravenous Q12H   Continuous Infusions:   LOS: 29 days   Alejandro Marker, DO [name] Available via Epic secure chat 7am-7pm After these hours, please refer to coverage provider listed on [email or web address] 03/14/2024, 7:37 PM

## 2024-03-14 NOTE — Progress Notes (Signed)
 Cortrak occluded MD was informed

## 2024-03-14 NOTE — Progress Notes (Signed)
 Pt off unit to IR and HD.

## 2024-03-14 NOTE — Progress Notes (Signed)
   03/14/24 1820  Vitals  Temp 98 F (36.7 C)  Temp Source Axillary  BP (!) 156/69  MAP (mmHg) 94  BP Location Left Arm  BP Method Automatic  Patient Position (if appropriate) Lying  Pulse Rate (!) 105  Pulse Rate Source Monitor  ECG Heart Rate (!) 105  Resp 16  Level of Consciousness  Level of Consciousness Alert  MEWS COLOR  MEWS Score Color Green  Oxygen  Therapy  SpO2 100 %  O2 Device Room Air  PCA/Epidural/Spinal Assessment  Respiratory Pattern Regular;Unlabored;Symmetrical  Glasgow Coma Scale  Eye Opening 4  Best Verbal Response (NON-intubated) 5  Best Motor Response 6  Glasgow Coma Scale Score 15  MEWS Score  MEWS Temp 0  MEWS Systolic 0  MEWS Pulse 1  MEWS RR 0  MEWS LOC 0  MEWS Score 1   Pt return to unit from Fluoroscopy and HD. Pt alert and verbally responsive. Cortrak in left nare. Report given to oncoming shift.

## 2024-03-14 NOTE — Progress Notes (Signed)
 Subjective: The patient is alert and pleasant.  Objective: Vital signs in last 24 hours: Temp:  [98.2 F (36.8 C)-99.3 F (37.4 C)] 99.3 F (37.4 C) (07/11 0346) Pulse Rate:  [100-111] 100 (07/11 0346) Resp:  [17-20] 20 (07/11 0346) BP: (120-156)/(64-81) 140/70 (07/11 0346) SpO2:  [93 %-100 %] 100 % (07/11 0346) Weight:  [52.4 kg] 52.4 kg (07/11 0346) Estimated body mass index is 17.83 kg/m as calculated from the following:   Height as of this encounter: 5' 7.5 (1.715 m).   Weight as of this encounter: 52.4 kg.   Intake/Output from previous day: 07/10 0701 - 07/11 0700 In: 0  Out: 40 [Stool:40] Intake/Output this shift: No intake/output data recorded.  Physical exam the patient is alert and pleasant.  She is mildly confused without change.  Lab Results: Recent Labs    03/13/24 0922 03/14/24 0545  WBC 9.8 8.4  HGB 10.1* 9.0*  HCT 30.7* 27.6*  PLT 230 202   BMET Recent Labs    03/12/24 0512 03/13/24 0922  NA 127* 137  K 5.8* 4.8  CL 83* 87*  CO2 29 32  GLUCOSE 107* 118*  BUN 45* 25*  CREATININE 5.63* 4.13*  CALCIUM  9.7 10.7*    Studies/Results: DG Abd 1 View Result Date: 03/13/2024 CLINICAL DATA:  Feeding tube placement EXAM: ABDOMEN - 1 VIEW COMPARISON:  Abdominal radiograph dated 03/12/2024 FINDINGS: Enteric tube tip projects over the right upper quadrant, likely within the proximal duodenum. Contrast opacification of the proximal duodenal lumen. IMPRESSION: Enteric tube tip projects over the right upper quadrant, likely within the proximal duodenum. Electronically Signed   By: Limin  Xu M.D.   On: 03/13/2024 14:47   DG Abd Portable 1V Result Date: 03/12/2024 CLINICAL DATA:  [id number] Encounter for feeding tube placement [id number] EXAM: PORTABLE ABDOMEN - 1 VIEW COMPARISON:  March 04, 2024 FINDINGS: Weighted feeding tube is coiled in the stomach. Nonobstructive bowel gas pattern. No pneumoperitoneum. No organomegaly or radiopaque calculi. The lung bases are clear.  IMPRESSION: Redemonstrated weighted feeding tube, which has been retracted in the interim, now coiled in the stomach. Electronically Signed   By: Rogelia Myers M.D.   On: 03/12/2024 12:34    Assessment/Plan: Ventriculomegaly: The patient's CSF studies are not suggestive of meningitis.  Tubes are pending.  Her pressure was very low so I do not think she would benefit from a shunt.  LOS: 29 days     [name] [name] 03/14/2024, 7:05 AM     Patient ID: [name] [name], female   DOB: 08-Apr-1981, 43 y.o.   MRN: [id number]

## 2024-03-14 NOTE — Progress Notes (Signed)
 Calorie Count Note  48 hour calorie count ordered.  Diet: Full Liquids Supplements: Ensure Plus High Protein  Day 1 Results  7/10 Lunch: pt refused  7/10 Dinner: no documentation 7/11 Breakfast: 123 calories, 3.5 gm protein (pt vomited shortly after meal per RN) Supplements: documented as not given/refused  Total intake 123 kcal (7% of minimum estimated needs)  3.5 protein (5% of minimum estimated needs)  Estimated Nutritional Needs:  Kcal:  1800-2000 Protein:  75-90 grams Fluid:  1L + UOP  Nutrition Diagnosis: Moderate Malnutrition related to chronic illness (ESRD on HD/DM) as evidenced by mild fat depletion, mild muscle depletion.   Goal: Patient will meet greater than or equal to 90% of their needs   Intervention:  Ensure Plus High Protein po BID, each supplement provides 350 kcal and 20 grams of protein  Rena-Vite daily  House Trays    Nestora Glatter RD, LDN Clinical Dietitian

## 2024-03-14 NOTE — Progress Notes (Signed)
 Received patient in bed to unit.  Alert and oriented.  Informed consent signed and in chart.   TX duration: 2 hours and 20 minutes  Patient wanted to come off 41 minutes early.  AMA paperwork signed and Dr. Geralynn informed Patient tolerated well.  Transported back to the room  Alert, without acute distress.  Hand-off given to patient's nurse.   Access used: R Upper arm fistula Access issues: none  Total UF removed: 1.4L Medication(s) given: Tylenol  and Dilaudid    03/14/24 1730  Vitals  Temp 98.7 F (37.1 C)  Temp Source Oral  BP 106/69  MAP (mmHg) 80  Pulse Rate (!) 104  Resp 15  Oxygen  Therapy  SpO2 99 %  O2 Device Room Air  During Treatment Monitoring  Duration of HD Treatment -hour(s) 2.33 hour(s)  HD Safety Checks Performed Yes  Intra-Hemodialysis Comments See progress note (Patient signed AMA paperwork to come off early)  Dialysis Fluid Bolus Normal Saline  Bolus Amount (mL) 300 mL  Fistula / Graft Right Upper arm Arteriovenous fistula  Placement Date/Time: (c) 12/08/20 1403   Placed prior to admission: Yes  Orientation: Right  Access Location: Upper arm  Access Type: Arteriovenous fistula  Status Deaccessed     Camellia Brasil LPN [facility]

## 2024-03-14 NOTE — Progress Notes (Signed)
 Occupational Therapy Treatment Patient Details Name: [name] [name] MRN: [id number] DOB: 07-Dec-1980 Today's Date: 03/14/2024   History of present illness 43 yo female who presented to the ED 6/11 with n/v, abd pain after HD. 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 CRRT 6/13- 6/15. Cerebral angiogram 6/25 shows no intracranial aneurysms, AVM, or fistula seen to explain the patient's subarachnoid hemorrhage, diffuse mild-moderate anterior circulation vasospasm. EVD 6/12-6/30.  on 7/1, MRI shows new small left parietal cva, cortrak placed postpyloric, vagal episodes with N/V. 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  LP 7/10- negative.   OT comments  Patient with fair progress toward patient focused goals this session.  Patient declining OOB to the chair, wanting to eat and take her medications first.  Patient able to perform basic bed mobility with supervision, once setup, patient able to wash hands and face and begin eating her breakfast.  Cues to incorporate left arm, min spillage noted with decreased attention.  Patient not complaining of double vision.  OT to continue efforts in the acute setting to address deficits and advance mobility.  Patient will benefit from continued inpatient follow up therapy, <3 hours/day.      If plan is discharge home, recommend the following:  A lot of help with walking and/or transfers;Two people to help with walking and/or transfers;A lot of help with bathing/dressing/bathroom;Two people to help with bathing/dressing/bathroom;Assistance with cooking/housework;Assistance with feeding;Direct supervision/assist for medications management;Direct supervision/assist for financial management;Assist for transportation;Supervision due to cognitive status;Help with stairs or ramp for entrance   Equipment Recommendations  None recommended by OT     Recommendations for Other Services      Precautions / Restrictions Precautions Precautions: Fall Recall of Precautions/Restrictions: Impaired Precaution/Restrictions Comments: flexiseal Restrictions Weight Bearing Restrictions Per Provider Order: No       Mobility Bed Mobility Overal bed mobility: Needs Assistance Bed Mobility: Rolling Rolling: Supervision              Transfers                         Balance                                           ADL either performed or assessed with clinical judgement   ADL   Eating/Feeding: Supervision/ safety;Bed level   Grooming: Set up;Bed level   Upper Body Bathing: Moderate assistance;Bed level   Lower Body Bathing: Maximal assistance;Bed level   Upper Body Dressing : Moderate assistance;Bed level   Lower Body Dressing: Maximal assistance;Bed level                 General ADL Comments: declined OOB, wanting to eat and meds    Extremity/Trunk Assessment Upper Extremity Assessment Upper Extremity Assessment: Overall WFL for tasks assessed RUE Deficits / Details: Incorporating B UE's with self feeding, min spillage, more to do with attention than coordination LUE Deficits / Details: Cues to incorporate LUE in activities LUE Coordination: decreased fine motor   Lower Extremity Assessment Lower Extremity Assessment: Defer to PT evaluation        Vision Patient Visual Report: Diplopia Vision Assessment?: Vision impaired- to be further tested in functional context Eye Alignment: Impaired (comment) Ocular Range of Motion: Impaired-to be further tested in functional context Tracking/Visual Pursuits:  Decreased smoothness of horizontal tracking;Decreased smoothness of vertical tracking Additional Comments: patient staing double vision at times, none at the time of the tx.   Perception Perception Perception: Impaired Preception Impairment Details:  Inattention/Neglect Perception-Other Comments: ? L   Praxis     Communication Communication Communication: No apparent difficulties   Cognition Arousal: Alert Behavior During Therapy: Flat affect Cognition: Cognition impaired   Orientation impairments: Situation, Time   Memory impairment (select all impairments): Short-term memory Attention impairment (select first level of impairment): Sustained attention Executive functioning impairment (select all impairments): Initiation                     Following commands impaired: Only follows one step commands consistently      Cueing   Cueing Techniques: Verbal cues  Exercises      Shoulder Instructions       General Comments      Pertinent Vitals/ Pain       Pain Assessment Pain Assessment: No/denies pain Pain Intervention(s): Monitored during session  Home Living                                          Prior Functioning/Environment              Frequency  Min 2X/week        Progress Toward Goals  OT Goals(current goals can now be found in the care plan section)  Progress towards OT goals: Progressing toward goals  Acute Rehab OT Goals OT Goal Formulation: With patient Time For Goal Achievement: 03/28/24 Potential to Achieve Goals: Fair ADL Goals Pt Will Perform Grooming: with supervision;sitting Pt Will Perform Upper Body Dressing: with min assist;sitting Pt Will Perform Lower Body Dressing: with min assist;sitting/lateral leans;sit to/from stand Pt Will Transfer to Toilet: with min assist;stand pivot transfer;bedside commode  Plan      Co-evaluation                 AM-PAC OT 6 Clicks Daily Activity     Outcome Measure   Help from another person eating meals?: A Little Help from another person taking care of personal grooming?: A Little Help from another person toileting, which includes using toliet, bedpan, or urinal?: A Lot Help from another person  bathing (including washing, rinsing, drying)?: A Lot Help from another person to put on and taking off regular upper body clothing?: A Lot Help from another person to put on and taking off regular lower body clothing?: A Lot 6 Click Score: 14    End of Session    OT Visit Diagnosis: Unsteadiness on feet (R26.81);Other abnormalities of gait and mobility (R26.89);Muscle weakness (generalized) (M62.81);Hemiplegia and hemiparesis   Activity Tolerance Patient tolerated treatment well   Patient Left in bed;with nursing/sitter in room   Nurse Communication Mobility status        Time: 9169-9149 OT Time Calculation (min): 20 min  Charges: OT General Charges $OT Visit: 1 Visit OT Treatments $Self Care/Home Management : 8-22 mins  03/14/2024  RP, OTR/L  Acute Rehabilitation Services  Office:  [phone]   [name] [name] 03/14/2024, 9:03 AM

## 2024-03-14 NOTE — Progress Notes (Signed)
  [facility] Progress Note  Subjective:  Seen in room Trying to feed herself Might be refusing HD today   Vitals:   03/13/24 2327 03/14/24 0346 03/14/24 0759 03/14/24 1207  BP: (!) 156/77 (!) 140/70 (!) 149/74 135/66  Pulse: (!) 109 100 (!) 103 (!) 104  Resp: 17 20 19 19   Temp: 98.8 F (37.1 C) 99.3 F (37.4 C) 99.9 F (37.7 C) 99.3 F (37.4 C)  TempSrc: Oral Oral Oral Oral  SpO2: 100% 100% 100% 98%  Weight:  52.4 kg    Height:        Exam: General: Lying in bed, no distress Heart: Normal rate, murmur present, no rub Lungs: bilateral chest rise, no increased work of breathing Abdomen: soft, nontender Extremities: No lower extremity edema  Dialysis Access:  RUE AVF +t/b   OP HD:  MWF AF 3h  B400   57.2kg  2K   AVF  Heparin  none - Hectoral 4mcg IV q HD - Mircera 150mcg IV q 2 weeks (last given 6/9 -> due 6/23)   Assessment/Plan: Intracerebral hemorrhage: C/b hydrocephalus, 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 TCDs with evidence of vasospasm 02/11/24. Followed closely by neurosurgery, evd removed 6/30 CVA: New CVA found on 7/1 ESRD: MWF HD. S/p CRRT 6/12-6/15, then iHD. HD today, but pt might be refusing.  Hyperkalemia: resolved w/ TD change to Nepro Volume: no vol excess on exam, UF 2 L next HD HTN: bp's good today, 140/80 range, not on any bp meds Anemia of ESRD: Hgb 8-10 here.Getting darbe at 60 micrograms sq weekly. Iron  adequate Secondary HPTH: Ca and Phos high, VDRA stopped 6/21, added sevelamer  via tube. PTH 115.  Could consider low-dose Sensipar .  Continue to monitor for now because hypercalcemia likely a minimal problem at this point.  Pancreas/ kidney transplant (2014): for the pancreas is on home dosing of CyA 225mg  qam and 200 mg qpm + Cellcept  and prednisone .     Myer Fret MD  [facility] 03/14/2024, 12:44 PM  Recent Labs  Lab 03/13/24 0922 03/14/24 0545  HGB 10.1* 9.0*  ALBUMIN 3.3* 3.2*  CALCIUM  10.7* 10.4*  PHOS 6.3* 6.6*   CREATININE 4.13* 5.50*  K 4.8 4.5   No results for input(s): IRON , TIBC, FERRITIN in the last 168 hours. Inpatient medications:  acetaminophen   650 mg Oral Q4H   Or   acetaminophen   650 mg Rectal Q4H   aspirin   81 mg Oral Daily   Chlorhexidine  Gluconate Cloth  6 each Topical Q0600   Chlorhexidine  Gluconate Cloth  6 each Topical Q0600   cycloSPORINE   200 mg Oral QHS   cycloSPORINE   225 mg Oral Daily   darbepoetin (ARANESP ) injection - DIALYSIS  60 mcg Subcutaneous Q Fri-1800   feeding supplement  237 mL Oral BID BM   feeding supplement (NEPRO CARB STEADY)  1,000 mL Per Tube Q24H   heparin  injection (subcutaneous)  5,000 Units Subcutaneous Q8H   insulin  aspart  0-9 Units Subcutaneous Q4H   lacosamide   100 mg Oral BID   metoCLOPramide  (REGLAN ) injection  5 mg Intravenous Q8H   multivitamin  1 tablet Oral QHS   mycophenolate   500 mg Oral BID   predniSONE   15 mg Oral Q breakfast   sevelamer  carbonate  2.4 g Oral TID WC   sodium bicarbonate   1,300 mg Oral TID   sodium chloride  flush  3 mL Intravenous Q12H     albuterol , artificial tears, atropine , butalbital -acetaminophen -caffeine , Gerhardt's butt cream, hydrALAZINE , HYDROmorphone  (DILAUDID ) injection, iohexol ,  labetalol , loperamide  HCl, naLOXone  (NARCAN )  injection, ondansetron  (ZOFRAN ) IV, mouth rinse, simethicone 

## 2024-03-14 NOTE — Progress Notes (Signed)
 Physical Therapy Treatment Patient Details Name: [name] [name] MRN: [id number] DOB: 1981/05/09 Today's Date: 03/14/2024   History of Present Illness 43 yo female who presented to the ED 6/11 with n/v, abd pain after HD. 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 CRRT 6/13- 6/15. Cerebral angiogram 6/25 shows no intracranial aneurysms, AVM, or fistula seen to explain the patient's subarachnoid hemorrhage, diffuse mild-moderate anterior circulation vasospasm. EVD 6/12-6/30.  on 7/1, MRI shows new small left parietal cva, cortrak placed postpyloric, vagal episodes with N/V. LP 7/10- negative. 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    PT Comments  Pt was more alert and awake this session despite complaints of tiredness. Pt continues to demonstrate ability to roll in bed but still requires min A to sit EOB. Pt was following one step cues more consistently but required repeated cueing at times. Pt was able to stand EOB with +2 physical assist for safety and ambulate to recliner chair that was 10 ft away. Pt requested to stay in recliner for a bit to sit upright.    If plan is discharge home, recommend the following: Assistance with cooking/housework;Assistance with feeding;Direct supervision/assist for medications management;Direct supervision/assist for financial management;Assist for transportation;Help with stairs or ramp for entrance;Supervision due to cognitive status;A lot of help with walking and/or transfers;A lot of help with bathing/dressing/bathroom   Can travel by private vehicle        Equipment Recommendations  Wheelchair cushion (measurements PT);Wheelchair (measurements PT);Hospital bed;Hoyer lift    Recommendations for Other Services       Precautions / Restrictions Precautions Precautions: Fall Recall of Precautions/Restrictions: Impaired Precaution/Restrictions  Comments: flexiseal Restrictions Weight Bearing Restrictions Per Provider Order: No     Mobility  Bed Mobility Overal bed mobility: Needs Assistance Bed Mobility: Supine to Sit Rolling: Min assist   Supine to sit: Min assist     General bed mobility comments: spontaneous roll towards L, sequecing cues and physical assist with UE to EOB.    Transfers Overall transfer level: Needs assistance Equipment used: 2 person hand held assist Transfers: Sit to/from Stand, Bed to chair/wheelchair/BSC Sit to Stand: Min assist, +2 physical assistance           General transfer comment: Assist with power up to stand and steady. STSx2    Ambulation/Gait Ambulation/Gait assistance: +2 physical assistance, Min assist Gait Distance (Feet): 10 Feet Assistive device: 2 person hand held assist Gait Pattern/deviations: Trunk flexed, Decreased stride length, Shuffle Gait velocity: decreased     General Gait Details: Pt able to shuffle to chair w/ +2 physical assist, monitored for knee buckling. Cues to upright posture and room orientation   Stairs             Wheelchair Mobility     Tilt Bed    Modified Rankin (Stroke Patients Only) Modified Rankin (Stroke Patients Only) Pre-Morbid Rankin Score: No symptoms Modified Rankin: Moderately severe disability     Balance Overall balance assessment: Needs assistance Sitting-balance support: Feet supported, Single extremity supported Sitting balance-Leahy Scale: Fair Sitting balance - Comments: can sit EOB with cues to sit upright. Pt's lethargy limiting balance   Standing balance support: Bilateral upper extremity supported Standing balance-Leahy Scale: Poor Standing balance comment: +2 steadying assist while standing                            Communication Communication Communication: No apparent  difficulties  Cognition       PT - Cognitive impairments: Attention, Awareness, Problem solving,  Safety/Judgement                       PT - Cognition Comments: pt A&Ox3, to self, location, and situation. Pt can't recall date Following commands: Impaired Following commands impaired: Only follows one step commands consistently    Cueing    Exercises      General Comments General comments (skin integrity, edema, etc.): vss      Pertinent Vitals/Pain Pain Assessment Pain Assessment: Faces Faces Pain Scale: Hurts a little bit Pain Location: nausea, complaints of chest Pain Descriptors / Indicators: Moaning, Discomfort Pain Intervention(s): Limited activity within patient's tolerance, Monitored during session    Home Living                          Prior Function            PT Goals (current goals can now be found in the care plan section) Acute Rehab PT Goals Patient Stated Goal: to rest PT Goal Formulation: With patient Time For Goal Achievement: 03/20/24 Potential to Achieve Goals: Good Progress towards PT goals: Progressing toward goals    Frequency    Min 2X/week      PT Plan      Co-evaluation              AM-PAC PT 6 Clicks Mobility   Outcome Measure  Help needed turning from your back to your side while in a flat bed without using bedrails?: A Little Help needed moving from lying on your back to sitting on the side of a flat bed without using bedrails?: A Lot Help needed moving to and from a bed to a chair (including a wheelchair)?: A Lot Help needed standing up from a chair using your arms (e.g., wheelchair or bedside chair)?: A Lot Help needed to walk in hospital room?: A Lot Help needed climbing 3-5 steps with a railing? : A Lot 6 Click Score: 13    End of Session Equipment Utilized During Treatment: Gait belt Activity Tolerance: Patient limited by fatigue;Patient limited by lethargy Patient left: in chair;with call bell/phone within reach;with chair alarm set Nurse Communication: Mobility status PT Visit  Diagnosis: Muscle weakness (generalized) (M62.81);Other symptoms and signs involving the nervous system (R29.898) Hemiplegia - Right/Left: Left Hemiplegia - dominant/non-dominant: Non-dominant Hemiplegia - caused by: Other Nontraumatic intracranial hemorrhage     Time: 8944-8876 PT Time Calculation (min) (ACUTE ONLY): 28 min  Charges:    $Therapeutic Activity: 8-22 mins $Neuromuscular Re-education: 8-22 mins PT General Charges $$ ACUTE PT VISIT: 1 Visit                     Quintin Campi, SPT     Quintin Campi 03/14/2024, 12:49 PM

## 2024-03-14 NOTE — Progress Notes (Signed)
 Cortrak Tube Team Note  Consult received to place a bridle on a small bore tube placed by fluoroscopy team. Bridle placed without incident. Tube left at 93 cm in left nare.   Olivia Kenning, RD Registered Dietitian  See Amion for more information

## 2024-03-15 ENCOUNTER — Other Ambulatory Visit (HOSPITAL_COMMUNITY)

## 2024-03-15 DIAGNOSIS — I160 Hypertensive urgency: Secondary | ICD-10-CM | POA: Diagnosis not present

## 2024-03-15 DIAGNOSIS — K529 Noninfective gastroenteritis and colitis, unspecified: Secondary | ICD-10-CM | POA: Diagnosis not present

## 2024-03-15 DIAGNOSIS — I609 Nontraumatic subarachnoid hemorrhage, unspecified: Secondary | ICD-10-CM | POA: Diagnosis not present

## 2024-03-15 DIAGNOSIS — Z8639 Personal history of other endocrine, nutritional and metabolic disease: Secondary | ICD-10-CM | POA: Diagnosis not present

## 2024-03-15 DIAGNOSIS — R079 Chest pain, unspecified: Secondary | ICD-10-CM

## 2024-03-15 LAB — CBC WITH DIFFERENTIAL/PLATELET
Abs Immature Granulocytes: 0.02 K/uL (ref 0.00–0.07)
Basophils Absolute: 0 K/uL (ref 0.0–0.1)
Basophils Relative: 0 %
Eosinophils Absolute: 0.1 K/uL (ref 0.0–0.5)
Eosinophils Relative: 2 %
HCT: 26.8 % — ABNORMAL LOW (ref 36.0–46.0)
Hemoglobin: 8.6 g/dL — ABNORMAL LOW (ref 12.0–15.0)
Immature Granulocytes: 0 %
Lymphocytes Relative: 15 %
Lymphs Abs: 1 K/uL (ref 0.7–4.0)
MCH: 31.9 pg (ref 26.0–34.0)
MCHC: 32.1 g/dL (ref 30.0–36.0)
MCV: 99.3 fL (ref 80.0–100.0)
Monocytes Absolute: 0.7 K/uL (ref 0.1–1.0)
Monocytes Relative: 11 %
Neutro Abs: 4.8 K/uL (ref 1.7–7.7)
Neutrophils Relative %: 72 %
Platelets: 200 K/uL (ref 150–400)
RBC: 2.7 MIL/uL — ABNORMAL LOW (ref 3.87–5.11)
RDW: 15.2 % (ref 11.5–15.5)
WBC: 6.6 K/uL (ref 4.0–10.5)
nRBC: 0 % (ref 0.0–0.2)

## 2024-03-15 LAB — COMPREHENSIVE METABOLIC PANEL WITH GFR
ALT: 12 U/L (ref 0–44)
AST: 17 U/L (ref 15–41)
Albumin: 2.7 g/dL — ABNORMAL LOW (ref 3.5–5.0)
Alkaline Phosphatase: 96 U/L (ref 38–126)
Anion gap: 8 (ref 5–15)
BUN: 27 mg/dL — ABNORMAL HIGH (ref 6–20)
CO2: 34 mmol/L — ABNORMAL HIGH (ref 22–32)
Calcium: 9.9 mg/dL (ref 8.9–10.3)
Chloride: 94 mmol/L — ABNORMAL LOW (ref 98–111)
Creatinine, Ser: 3.84 mg/dL — ABNORMAL HIGH (ref 0.44–1.00)
GFR, Estimated: 14 mL/min — ABNORMAL LOW (ref >60)
Glucose, Bld: 142 mg/dL — ABNORMAL HIGH (ref 70–99)
Potassium: 3.9 mmol/L (ref 3.5–5.1)
Sodium: 136 mmol/L (ref 135–145)
Total Bilirubin: 0.5 mg/dL (ref 0.0–1.2)
Total Protein: 6.4 g/dL — ABNORMAL LOW (ref 6.5–8.1)

## 2024-03-15 LAB — GLUCOSE, CAPILLARY
Glucose-Capillary: 126 mg/dL — ABNORMAL HIGH (ref 70–99)
Glucose-Capillary: 132 mg/dL — ABNORMAL HIGH (ref 70–99)
Glucose-Capillary: 160 mg/dL — ABNORMAL HIGH (ref 70–99)
Glucose-Capillary: 115 mg/dL — ABNORMAL HIGH (ref 70–99)
Glucose-Capillary: 103 mg/dL — ABNORMAL HIGH (ref 70–99)
Glucose-Capillary: 100 mg/dL — ABNORMAL HIGH (ref 70–99)

## 2024-03-15 LAB — PHOSPHORUS: Phosphorus: 4.4 mg/dL (ref 2.5–4.6)

## 2024-03-15 LAB — TROPONIN I (HIGH SENSITIVITY)
Troponin I (High Sensitivity): 68 ng/L — ABNORMAL HIGH (ref <18)
Troponin I (High Sensitivity): 63 ng/L — ABNORMAL HIGH (ref <18)

## 2024-03-15 LAB — MAGNESIUM: Magnesium: 2.6 mg/dL — ABNORMAL HIGH (ref 1.7–2.4)

## 2024-03-15 MED ORDER — METOCLOPRAMIDE HCL 5 MG PO TABS
5.0000 mg | ORAL_TABLET | Freq: Three times a day (TID) | ORAL | Status: DC
  Administered 2024-03-15 – 2024-03-19 (×10): 5 mg
  Filled 2024-03-15 (×17): qty 1

## 2024-03-15 MED ORDER — NITROGLYCERIN 0.4 MG SL SUBL
0.4000 mg | SUBLINGUAL_TABLET | SUBLINGUAL | Status: DC | PRN
  Administered 2024-03-15 – 2024-03-16 (×2): 0.4 mg via SUBLINGUAL
  Filled 2024-03-15 (×2): qty 1

## 2024-03-15 MED ORDER — METOCLOPRAMIDE HCL 5 MG PO TABS
5.0000 mg | ORAL_TABLET | Freq: Three times a day (TID) | ORAL | Status: DC
  Administered 2024-03-16 – 2024-03-17 (×2): 5 mg via ORAL
  Filled 2024-03-15 (×18): qty 1

## 2024-03-15 NOTE — Plan of Care (Signed)
  Problem: Activity: Goal: Risk for activity intolerance will decrease Outcome: Progressing   Problem: Coping: Goal: Level of anxiety will decrease Outcome: Progressing   Problem: Elimination: Goal: Will not experience complications related to bowel motility Outcome: Progressing   Problem: Pain Managment: Goal: General experience of comfort will improve and/or be controlled Outcome: Progressing   Problem: Safety: Goal: Ability to remain free from injury will improve Outcome: Progressing

## 2024-03-15 NOTE — Progress Notes (Signed)
 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 Hypertensive in ED SBP 240s.  CT scan suggested colitis.  Patient's mentation continued to worsen. CT head was subsequently done which revealed intracranial hemorrhage.  Patient was admitted to the ICU.     Consultants: Critical care medicine.  Neurosurgery.  Neurology.  Nephrology   Significant Hospital Events:  6/11 ED w n/v AMS. Abx IVF for colitis 6/12 Admit. AMS decr LOC. Incr O2 req. PCCM consult and code stroke called seen w rapid response nephro NP then neuro  6/13 Intubated with EVD, responding to commands  6/14 Extubated 6/17 acute L cerebellar infarct. Keppra  dc  6/18 Dialysis, fevering, EVD at 10 (129 mL last 24 hrs) 6/19 Narcotic dependence a driving factor. +Vasospasm on TCD. CTA with evolving Left cerebellar infarct, no HT or mass effect, substantially decreased caliber of multiple intracranial arteries and bilateral Circle-[place] branches (especially: Distal left V4, supraclinoid ICAs, MCA and ACA origins), appearance c/w widespread Vasospasm, Vasoconstriction. No associated large vessel occlusion. 6/21 CSF sampline from EVD -- WBC 244 RBC 19000, cx ngtd  6/25: diagnostic angio today with nsgy - negative 6/26 evd leaking when clamped  6/30 evd removed, HD overnight, HTN improved, 7 days cefazolin  complete 7/1 more lethargic> minimal response to narcan  x2, oxy stopped, EEG neg, MRI - new small left parietal cva, cortrak placed postpyloric, vagal episodes with N/V 7/2 iHD, vimpat  started 7/9 she appears agitated.  IVs were removed.  LP to be initiated to evaluate her opening pressure and to ensure that she does not have meningitis if this is still pending to be done. 7/10 LP  done at bedside by Neurosurgery. Cortrak was removed yesterday evening and replaced by Fluoro team today.  7/11 Cortrak obstructed and reomved and had to be replaced by Flouro. Calorie Count initiated. Undergoing Dialysis today  7/12: Having chest discomfort. Palliative Care Consulted    Assessment and Plan:  Intracranial hemorrhage with brain compression/subarachnoid hemorrhage/communicating hydrocephalus -Patient seen by neurology and neurosurgery.   -Underwent placement of right frontal pressure ventriculostomy via bur hole. -Ventriculostomy was removed on 6/30. -Seems to be stable from neurological standpoint except for headaches.  Continue scheduled Tylenol  and as needed Fioricet .  Also noted to be on as needed morphine .  Could add as needed oxycodone . -Underwent CT head the day before yesterday as ordered by neurosurgery.  This raised concern for a new infarct.  Subsequently MRI brain was done.  This showed small foci of hemorrhage in the posterior left cerebellum without evidence for acute infarct.  Punctate focus of acute infarct involving the medial left occipital cortex was seen.  Evolving subacute infarct in the left parietal lobe seen.  Trace blood products were noted. -Seen by neurosurgery and they are concerned about the hydrocephalus.  They have ordered LP to measure opening pressure and to ensure that she does not have meningitis with her prolonged intraventricular catheter..  IR was consulted to do this but the IR team advised that typically image guided LP is reserved for those who can and have failed attempts on the floor. Neurosurgery now doing LP bedside today and needle placed into the L3-4 interspace.  -The neurosurgical team feels that she would perhaps benefit from a ventriculoperitoneal shunt but want  to rule out meningitis and measure her spinal fluid pressure first and they were low and not indicative of Meningitis. NSGY does not believe she would benefit from a  Shunt. -Palliative Care consulted for GOC discussion    Concern for seizure activity: Seen by Neurology.  Underwent EEG.  Patient was started on Lacosamide  100 mg po BID.  Improvement noted.   Vasospasm with left cerebellar infarct: Seen by Neurology.  Noted to be on aspirin . LDL 63.  HbA1c 4.2. Patient noted to be on midodrine  for BP augmentation but now due persistent hypotension midodrine  has been discontinued. PT and OT following.  SNF being pursued. -A small punctate focus of acute infarct was noted in the medial left occipital cortex.  Per neurosurgery there is nothing to be done for that.  Continue current management.  Will run this by neurology as well.   Essential Hypertension: Midodrine  was discontinued.  Blood pressure did drop with hemodialysis but now elevated.  Pain could be contributing to high readings.  She seems to have quite brittle blood pressure.  Would like to avoid antihypertensives as much as possible.  Continue as needed medications for now. CTM BP per Protocol. Last BP reading was 165/76   Chest Pain r/o ACS: Continues to have Chest Discomfort and Pain. Troponin Elevated mildly and went from 68 -> 63. EKG showed some T Wave Inversions. Give NTG. Check LTD Echo.   End-stage renal disease on hemodialysis/Hyperkalemia: History of failed transplant is present.  She is chronically immunosuppressed with CellCept , Cyclosporine , and Prednisone  BUN/Cr Trend: Recent Labs  Lab 03/08/24 0338 03/10/24 0222 03/11/24 0643 03/12/24 0512 03/13/24 0922 03/14/24 0545 03/15/24 0637  BUN 29* 60* 33* 45* 25* 35* 27*  CREATININE 4.04* 6.78* 4.36* 5.63* 4.13* 5.50* 3.84*  -Further management Management per nephrology. Patient is on sodium bicarbonate . K+ Trend is improving:  Recent Labs  Lab 03/10/24 0222 03/10/24 1100 03/11/24 0643 03/12/24 0512 03/13/24 0922 03/14/24 0545 03/15/24 0637  K 6.6* 4.4 5.6* 5.8* 4.8 4.5 3.9  -Potassium level improved with hemodialysis.  Nephrology changed her tube feedings to Nepro and dialyzed the patient 7/9 and she is underwent Dialysis on 7/11 with nex planned HD 03/17/24 -Avoid Nephrotoxic Medications, Contrast Dyes, Hypotension and Dehydration to Ensure Adequate Renal Perfusion and will need to Renally Adjust Meds -Continue to Monitor and Trend Renal Function carefully and repeat CMP in the AM   Bradycardia: Secondary to vagal response. Resolved.   Oropharyngeal dysphagia: Noted to have Cortak feeding tube but was removed overnight.  Dietitian following.  Last seen by speech therapy on 7/2.  Patient's mentation has improved.  SLP evaluated and recommending FULL Liquid Diet.  The dietitian has recommended feeding tube to be placed postpyloric to avoid emesis  and this was done. If she can tolerate a full liquid diet will see how well her nutritional intake is. If continues to have N/V the dietitians are recommending discussing with her about with a venting G-tube in the future.  Will see how she does with the full liquid diet for now and replace Cortrak via Fluoro team.  -Will need repeat KUB intermittently   Colitis: Stable.  Nausea/Vomiting/Gastroparesis: Was going to Increase Metoclopramide  to 10 mg q8h however contraindicated due to her ESRD. C/w Supportive care and c/w Antiemetics w. Ondansetron  IV 4 mg q6hprn.     Status post Pancreatic Transplant/Diabetes: C/w Sensitive Novology q4h. CTM CBGs.  CBGs ranging from 100-123   Anemia of Chronic Disease/ Normocytic Anemia: Relatively Stable. Hgb/Hct Trend:  Recent Labs  Lab 03/06/24 0508 03/08/24 0338 03/10/24 0222 03/12/24 0512 03/13/24 0922 03/14/24 0545 03/15/24 0637  HGB 9.8* 9.5* 10.1* 9.1* 10.1* 9.0* 8.6*  HCT 29.4* 28.6* 30.7* 27.6* 30.7* 27.6* 26.8*  MCV 95.8 97.9 97.2 95.8 96.8 98.2 99.3  -Getting Darbepoetin Alfa  60 mcg sq weekly -CTM for S/Sx of Bleeding; No overt bleeding noted. Repeat CBC in the AM    Moderate Protein Calorie Malnutrition /  Underweight: Nutrition Status: Nutrition Problem: Moderate Malnutrition Etiology: chronic illness (ESRD on HD/DM) Signs/Symptoms: mild fat depletion, mild muscle depletion Interventions: Tube feeding Estimated body mass index is 16.94 kg/m as calculated from the following:   Height as of this encounter: 5' 7.5 (1.715 m).   Weight as of this encounter: 49.8 kg. -Calorie Count initiated.    DVT prophylaxis: heparin  injection 5,000 Units Start: 03/14/24 0600 SCDs Start: 02/14/24 0725    Code Status: Full Code Family Communication: No family present @ bedside  Disposition Plan:  Level of care: Progressive Status is: Inpatient Remains inpatient appropriate because: Needs further clinical improvement and tolerance of diet    Consultants:  Nephrology Neurosurgery Neurology Interventional Radiology Palliative Care Medicine  Procedures:  As delineated as above  Antimicrobials:  Anti-infectives (From admission, onward)    Start     Dose/Rate Route Frequency Ordered Stop   02/27/24 1200  vancomycin  (VANCOREADY) IVPB 500 mg/100 mL  Status:  Discontinued        500 mg 100 mL/hr over 60 Minutes Intravenous Every M-W-F (Hemodialysis) 02/25/24 1129 02/25/24 1436   02/26/24 2200  ceFAZolin  (ANCEF ) IVPB 1 g/50 mL premix        1 g 100 mL/hr over 30 Minutes Intravenous Every 24 hours 02/26/24 1009 03/03/24 2140   02/25/24 1530  vancomycin  (VANCOREADY) IVPB 500 mg/100 mL        500 mg 100 mL/hr over 60 Minutes Intravenous Every M-W-F (Hemodialysis) 02/25/24 1436 02/25/24 1934   02/23/24 1800  meropenem  (MERREM ) 1 g in sodium chloride  0.9 % 100 mL IVPB  Status:  Discontinued        1 g 200 mL/hr over 30 Minutes Intravenous Every 24 hours 02/23/24 1625 02/26/24 1009   02/23/24 1800  vancomycin  (VANCOREADY) IVPB 1250 mg/250 mL        1,250 mg 166.7 mL/hr over 90 Minutes Intravenous  Once 02/23/24 1625 02/23/24 1933   02/17/24 2200  piperacillin -tazobactam (ZOSYN ) IVPB 2.25 g        2.25  g 100 mL/hr over 30 Minutes Intravenous Every 8 hours 02/17/24 1412 02/21/24 2336   02/14/24 1800  vancomycin  (VANCOREADY) IVPB 2000 mg/400 mL        2,000 mg 200 mL/hr over 120 Minutes Intravenous  Once 02/14/24 1709 02/14/24 2048   02/14/24 1622  piperacillin -tazobactam (ZOSYN ) IVPB 3.375 g  Status:  Discontinued        3.375 g 100 mL/hr over 30 Minutes Intravenous Every 6 hours 02/14/24 1622 02/17/24 1412   02/14/24 1545  piperacillin -tazobactam (ZOSYN ) IVPB 3.375 g  Status:  Discontinued        3.375 g 12.5 mL/hr over 240 Minutes Intravenous Every 8 hours 02/14/24 1451 02/14/24 1453   02/14/24 1545  piperacillin -tazobactam (ZOSYN ) IVPB 2.25 g  Status:  Discontinued        2.25 g 100 mL/hr over 30 Minutes Intravenous Every 8 hours 02/14/24 1453 02/14/24 1622   02/14/24 0800  cefTRIAXone  (ROCEPHIN ) 2 g in sodium chloride  0.9 % 100 mL IVPB  Status:  Discontinued  2 g 200 mL/hr over 30 Minutes Intravenous Every 24 hours 02/14/24 0730 02/14/24 1451   02/14/24 0800  metroNIDAZOLE  (FLAGYL ) IVPB 500 mg  Status:  Discontinued        500 mg 100 mL/hr over 60 Minutes Intravenous Every 12 hours 02/14/24 0730 02/14/24 1451       Subjective: Seen and examined at bedside was feeling uncomfortable complaining of some chest discomfort.  States that she did not have any nausea or vomiting but continues to complain of chest discomfort and feels like she is having palpitations.  Requesting pain medication.  No other concerns or complaints at this time and has emesis bucket near bedside.  Objective: Vitals:   03/15/24 1212 03/15/24 1520 03/15/24 1600 03/15/24 1936  BP: (!) 150/74 (!) 165/79 (!) 144/71 (!) 165/76  Pulse: 96 89  98  Resp: 20 18 16 19   Temp: 99.1 F (37.3 C) 99 F (37.2 C)  98.8 F (37.1 C)  TempSrc: Oral Oral  Oral  SpO2: 96% 95%  95%  Weight:      Height:        Intake/Output Summary (Last 24 hours) at 03/15/2024 2023 Last data filed at 03/15/2024 1750 Gross per 24 hour   Intake 1368.67 ml  Output 65 ml  Net 1303.67 ml   Filed Weights   03/14/24 1440 03/14/24 1755 03/15/24 0500  Weight: 50 kg 48.6 kg 49.8 kg   Examination: Physical Exam:  Constitutional: Underweight thin chronically ill-appearing female who appears a little uncomfortable Respiratory: Diminished to auscultation bilaterally, no wheezing, rales, rhonchi or crackles. Normal respiratory effort and patient is not tachypenic. No accessory muscle use.  Unlabored breathing Cardiovascular: RRR, no murmurs / rubs / gallops. S1 and S2 auscultated. No extremity edema.  Abdomen: Soft, non-tender, non-distended.  Bowel sounds positive.  GU: Deferred. Musculoskeletal: No clubbing / cyanosis of digits/nails. No joint deformity upper and lower extremities.  Skin: No rashes, lesions, ulcers on limited skin evaluation but has a shaved head on the right side. No induration; Warm and dry.  Neurologic: Has some crossed eyes but cranial nerves II through XII grossly intact. Psychiatric: Anxious appearing  Data Reviewed: I have personally reviewed following labs and imaging studies  CBC: Recent Labs  Lab 03/10/24 0222 03/12/24 0512 03/13/24 0922 03/14/24 0545 03/15/24 0637  WBC 6.4 4.3 9.8 8.4 6.6  NEUTROABS  --   --  7.8* 6.4 4.8  HGB 10.1* 9.1* 10.1* 9.0* 8.6*  HCT 30.7* 27.6* 30.7* 27.6* 26.8*  MCV 97.2 95.8 96.8 98.2 99.3  PLT 213 169 230 202 200   Basic Metabolic Panel: Recent Labs  Lab 03/11/24 0643 03/12/24 0512 03/13/24 0922 03/14/24 0545 03/15/24 0637  NA 132* 127* 137 134* 136  K 5.6* 5.8* 4.8 4.5 3.9  CL 88* 83* 87* 86* 94*  CO2 26 29 32 29 34*  GLUCOSE 124* 107* 118* 107* 142*  BUN 33* 45* 25* 35* 27*  CREATININE 4.36* 5.63* 4.13* 5.50* 3.84*  CALCIUM  10.0 9.7 10.7* 10.4* 9.9  MG  --   --  2.9* 3.0* 2.6*  PHOS  --   --  6.3* 6.6* 4.4   GFR: Estimated Creatinine Clearance: 15 mL/min (A) (by C-G formula based on SCr of 3.84 mg/dL (H)). Liver Function Tests: Recent Labs   Lab 03/10/24 0222 03/13/24 0922 03/14/24 0545 03/15/24 0637  AST 29 21 25 17   ALT 9 12 12 12   ALKPHOS 109 98 106 96  BILITOT 0.7 0.6 0.6 0.5  PROT  6.2* 7.1 7.0 6.4*  ALBUMIN 2.9* 3.3* 3.2* 2.7*   No results for input(s): LIPASE, AMYLASE in the last 168 hours. No results for input(s): AMMONIA in the last 168 hours. Coagulation Profile: No results for input(s): INR, PROTIME in the last 168 hours. Cardiac Enzymes: No results for input(s): CKTOTAL, CKMB, CKMBINDEX, TROPONINI in the last 168 hours. BNP (last 3 results) No results for input(s): PROBNP in the last 8760 hours. HbA1C: No results for input(s): HGBA1C in the last 72 hours. CBG: Recent Labs  Lab 03/15/24 0504 03/15/24 0751 03/15/24 1150 03/15/24 1518 03/15/24 1951  GLUCAP 126* 132* 160* 115* 103*   Lipid Profile: No results for input(s): CHOL, HDL, LDLCALC, TRIG, CHOLHDL, LDLDIRECT in the last 72 hours. Thyroid Function Tests: No results for input(s): TSH, T4TOTAL, FREET4, T3FREE, THYROIDAB in the last 72 hours. Anemia Panel: No results for input(s): VITAMINB12, FOLATE, FERRITIN, TIBC, IRON , RETICCTPCT in the last 72 hours. Sepsis Labs: No results for input(s): PROCALCITON, LATICACIDVEN in the last 168 hours.  Recent Results (from the past 240 hours)  CSF culture w Gram Stain     Status: None (Preliminary result)   Collection Time: 03/13/24 10:40 AM   Specimen: Back; Cerebrospinal Fluid  Result Value Ref Range Status   Specimen Description BACK  Final   Special Requests NONE  Final   Gram Stain NO WBC SEEN NO ORGANISMS SEEN CYTOSPIN SMEAR   Final   Culture   Final    NO GROWTH 2 DAYS Performed at [facility], [address], [place], [name] [address]    Report Status PENDING  Incomplete    Radiology Studies: DG Abd 1 View Result Date: 03/14/2024 CLINICAL DATA:  Feeding tube placement EXAM: ABDOMEN - 1 VIEW FLUOROSCOPY TIME:   Fluoroscopy Time:  42 seconds Radiation Exposure Index (if provided by the fluoroscopic device): 1.6 mGy Number of Acquired Spot Images: 1 COMPARISON:  Radiograph 03/13/2024 FINDINGS: Enteric tube tip in the third portion of the duodenum. Instilled enteric contrast is present in the descending duodenum. IMPRESSION: Enteric tube tip in the third portion of the duodenum. Electronically Signed   By: Norman Gatlin M.D.   On: 03/14/2024 18:05   Scheduled Meds:  acetaminophen   650 mg Oral Q4H   Or   acetaminophen   650 mg Rectal Q4H   aspirin   81 mg Oral Daily   Chlorhexidine  Gluconate Cloth  6 each Topical Q0600   Chlorhexidine  Gluconate Cloth  6 each Topical Q0600   cycloSPORINE   200 mg Oral QHS   cycloSPORINE   225 mg Oral Daily   darbepoetin (ARANESP ) injection - DIALYSIS  60 mcg Subcutaneous Q Fri-1800   feeding supplement  237 mL Oral BID BM   feeding supplement (NEPRO CARB STEADY)  1,000 mL Per Tube Q24H   heparin  injection (subcutaneous)  5,000 Units Subcutaneous Q8H   insulin  aspart  0-9 Units Subcutaneous Q4H   lacosamide   100 mg Oral BID   metoCLOPramide   5 mg Oral TID AC   Or   metoCLOPramide   5 mg Per Tube TID AC   multivitamin  1 tablet Oral QHS   mycophenolate   500 mg Oral BID   predniSONE   15 mg Oral Q breakfast   sevelamer  carbonate  2.4 g Oral TID WC   sodium bicarbonate   1,300 mg Oral TID   sodium chloride  flush  3 mL Intravenous Q12H   Continuous Infusions:   LOS: 30 days   Alejandro Marker, DO [name] Available via [employer]  secure chat 7am-7pm After these hours, please refer to coverage provider listed on [email or web address] 03/15/2024, 8:23 PM

## 2024-03-15 NOTE — Progress Notes (Signed)
 [facility] Progress Note  Subjective:  Seen in room NG tube is back in  Had 2.2 hrs HD yest, pt s/o early   Vitals:   03/14/24 1900 03/14/24 2315 03/15/24 0500 03/15/24 0754  BP: (!) 156/69 (!) 138/50  (!) 151/73  Pulse: (!) 106 (!) 104  (!) 101  Resp: 15 18  13   Temp: 98.6 F (37 C) 98.7 F (37.1 C)  99 F (37.2 C)  TempSrc: Oral Oral  Oral  SpO2: 100% 100%  95%  Weight:   49.8 kg   Height:        Exam: General: Lying in bed, no distress Heart: Normal rate, murmur present, no rub Lungs: bilateral chest rise, no increased work of breathing Abdomen: soft, nontender Extremities: No lower extremity edema  Dialysis Access:  RUE AVF +t/b   OP HD:  MWF AF 3h  B400   57.2kg  2K   AVF  Heparin  none - Hectoral 4mcg IV q HD - Mircera 150mcg IV q 2 weeks (last given 6/9 -> due 6/23)   Assessment/Plan: Intracerebral hemorrhage: C/b hydrocephalus, 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 TCDs with evidence of vasospasm 02/11/24. Followed closely by neurosurgery, evd removed 6/30 CVA: New CVA found on 7/1 ESRD: MWF HD. S/p CRRT 6/12-6/15, then iHD. HD Friday. Next HD Monday.  Hyperkalemia: resolved w/ TF changed to Nepro Volume: no vol excess on exam HTN: bp's good, 140/80 range, not on any bp meds Anemia of ESRD: Hgb 8-10 here.Getting darbe at 60 micrograms sq weekly. Iron  adequate Secondary HPTH: Ca and Phos high, VDRA stopped 6/21, added sevelamer  via tube. PTH 115.  Could consider low-dose Sensipar .  Continue to monitor for now because hypercalcemia likely a minimal problem at this point.  Pancreas/ kidney transplant (2014): for the functioning pancreas pt is getting her home dosing of CyA 225mg  qam and 200 mg qpm + Cellcept  and prednisone . CyA level 7/09 was 109 (goal 100-200).     Myer Fret MD  [facility] 03/15/2024, 11:52 AM  Recent Labs  Lab 03/14/24 0545 03/15/24 0637  HGB 9.0* 8.6*  ALBUMIN 3.2* 2.7*  CALCIUM  10.4* 9.9  PHOS 6.6* 4.4   CREATININE 5.50* 3.84*  K 4.5 3.9   No results for input(s): IRON , TIBC, FERRITIN in the last 168 hours. Inpatient medications:  acetaminophen   650 mg Oral Q4H   Or   acetaminophen   650 mg Rectal Q4H   aspirin   81 mg Oral Daily   Chlorhexidine  Gluconate Cloth  6 each Topical Q0600   Chlorhexidine  Gluconate Cloth  6 each Topical Q0600   cycloSPORINE   200 mg Oral QHS   cycloSPORINE   225 mg Oral Daily   darbepoetin (ARANESP ) injection - DIALYSIS  60 mcg Subcutaneous Q Fri-1800   feeding supplement  237 mL Oral BID BM   feeding supplement (NEPRO CARB STEADY)  1,000 mL Per Tube Q24H   heparin  injection (subcutaneous)  5,000 Units Subcutaneous Q8H   insulin  aspart  0-9 Units Subcutaneous Q4H   lacosamide   100 mg Oral BID   metoCLOPramide  (REGLAN ) injection  5 mg Intravenous Q8H   multivitamin  1 tablet Oral QHS   mycophenolate   500 mg Oral BID   predniSONE   15 mg Oral Q breakfast   sevelamer  carbonate  2.4 g Oral TID WC   sodium bicarbonate   1,300 mg Oral TID   sodium chloride  flush  3 mL Intravenous Q12H     albuterol , artificial tears, atropine , butalbital -acetaminophen -caffeine , Gerhardt's butt cream, hydrALAZINE ,  HYDROmorphone  (DILAUDID ) injection, iohexol , labetalol , loperamide  HCl, naLOXone  (NARCAN )  injection, nitroGLYCERIN , ondansetron  (ZOFRAN ) IV, mouth rinse, simethicone 

## 2024-03-16 ENCOUNTER — Inpatient Hospital Stay (HOSPITAL_COMMUNITY)

## 2024-03-16 DIAGNOSIS — I609 Nontraumatic subarachnoid hemorrhage, unspecified: Secondary | ICD-10-CM | POA: Diagnosis not present

## 2024-03-16 DIAGNOSIS — R079 Chest pain, unspecified: Secondary | ICD-10-CM | POA: Diagnosis not present

## 2024-03-16 DIAGNOSIS — K529 Noninfective gastroenteritis and colitis, unspecified: Secondary | ICD-10-CM | POA: Diagnosis not present

## 2024-03-16 DIAGNOSIS — Z8639 Personal history of other endocrine, nutritional and metabolic disease: Secondary | ICD-10-CM | POA: Diagnosis not present

## 2024-03-16 DIAGNOSIS — I160 Hypertensive urgency: Secondary | ICD-10-CM | POA: Diagnosis not present

## 2024-03-16 LAB — CBC WITH DIFFERENTIAL/PLATELET
Abs Immature Granulocytes: 0.02 K/uL (ref 0.00–0.07)
Basophils Absolute: 0 K/uL (ref 0.0–0.1)
Basophils Relative: 0 %
Eosinophils Absolute: 0.1 K/uL (ref 0.0–0.5)
Eosinophils Relative: 2 %
HCT: 24.7 % — ABNORMAL LOW (ref 36.0–46.0)
Hemoglobin: 8.1 g/dL — ABNORMAL LOW (ref 12.0–15.0)
Immature Granulocytes: 1 %
Lymphocytes Relative: 21 %
Lymphs Abs: 0.9 K/uL (ref 0.7–4.0)
MCH: 31.9 pg (ref 26.0–34.0)
MCHC: 32.8 g/dL (ref 30.0–36.0)
MCV: 97.2 fL (ref 80.0–100.0)
Monocytes Absolute: 0.5 K/uL (ref 0.1–1.0)
Monocytes Relative: 11 %
Neutro Abs: 2.7 K/uL (ref 1.7–7.7)
Neutrophils Relative %: 65 %
Platelets: 184 K/uL (ref 150–400)
RBC: 2.54 MIL/uL — ABNORMAL LOW (ref 3.87–5.11)
RDW: 14.7 % (ref 11.5–15.5)
WBC: 4.2 K/uL (ref 4.0–10.5)
nRBC: 0 % (ref 0.0–0.2)

## 2024-03-16 LAB — GLUCOSE, CAPILLARY
Glucose-Capillary: 95 mg/dL (ref 70–99)
Glucose-Capillary: 128 mg/dL — ABNORMAL HIGH (ref 70–99)
Glucose-Capillary: 143 mg/dL — ABNORMAL HIGH (ref 70–99)
Glucose-Capillary: 150 mg/dL — ABNORMAL HIGH (ref 70–99)
Glucose-Capillary: 90 mg/dL (ref 70–99)
Glucose-Capillary: 86 mg/dL (ref 70–99)

## 2024-03-16 LAB — COMPREHENSIVE METABOLIC PANEL WITH GFR
ALT: 13 U/L (ref 0–44)
AST: 19 U/L (ref 15–41)
Albumin: 2.9 g/dL — ABNORMAL LOW (ref 3.5–5.0)
Alkaline Phosphatase: 104 U/L (ref 38–126)
Anion gap: 15 (ref 5–15)
BUN: 41 mg/dL — ABNORMAL HIGH (ref 6–20)
CO2: 30 mmol/L (ref 22–32)
Calcium: 10.4 mg/dL — ABNORMAL HIGH (ref 8.9–10.3)
Chloride: 90 mmol/L — ABNORMAL LOW (ref 98–111)
Creatinine, Ser: 5.58 mg/dL — ABNORMAL HIGH (ref 0.44–1.00)
GFR, Estimated: 9 mL/min — ABNORMAL LOW (ref >60)
Glucose, Bld: 117 mg/dL — ABNORMAL HIGH (ref 70–99)
Potassium: 3.9 mmol/L (ref 3.5–5.1)
Sodium: 135 mmol/L (ref 135–145)
Total Bilirubin: 0.6 mg/dL (ref 0.0–1.2)
Total Protein: 6.3 g/dL — ABNORMAL LOW (ref 6.5–8.1)

## 2024-03-16 LAB — CSF CULTURE W GRAM STAIN
Culture: NO GROWTH
Gram Stain: NONE SEEN

## 2024-03-16 LAB — ECHOCARDIOGRAM LIMITED
Calc EF: 65.7 %
Height: 67.5 in
S' Lateral: 2.6 cm
Single Plane A2C EF: 66.3 %
Single Plane A4C EF: 64.2 %
Weight: 1834.23 [oz_av]

## 2024-03-16 LAB — PHOSPHORUS: Phosphorus: 5.2 mg/dL — ABNORMAL HIGH (ref 2.5–4.6)

## 2024-03-16 LAB — MAGNESIUM: Magnesium: 3.1 mg/dL — ABNORMAL HIGH (ref 1.7–2.4)

## 2024-03-16 MED ORDER — PROCHLORPERAZINE EDISYLATE 10 MG/2ML IJ SOLN
10.0000 mg | Freq: Four times a day (QID) | INTRAMUSCULAR | Status: DC | PRN
  Administered 2024-03-16 – 2024-03-31 (×24): 10 mg via INTRAVENOUS
  Filled 2024-03-16 (×25): qty 2

## 2024-03-16 MED ORDER — HYDROMORPHONE HCL 1 MG/ML IJ SOLN: 0.5000 mg | INTRAMUSCULAR | Status: DC | PRN

## 2024-03-16 MED ORDER — PROCHLORPERAZINE EDISYLATE 10 MG/2ML IJ SOLN
5.0000 mg | Freq: Four times a day (QID) | INTRAMUSCULAR | Status: AC | PRN
  Administered 2024-03-16 (×2): 5 mg via INTRAVENOUS
  Filled 2024-03-16 (×2): qty 2

## 2024-03-16 MED ORDER — BUTALBITAL-APAP-CAFFEINE 50-325-40 MG PO TABS
1.0000 | ORAL_TABLET | ORAL | Status: DC | PRN
  Administered 2024-03-17 – 2024-03-19 (×2): 1 via ORAL
  Filled 2024-03-16 (×3): qty 1

## 2024-03-16 MED ORDER — HYDROMORPHONE HCL 1 MG/ML IJ SOLN
0.5000 mg | INTRAMUSCULAR | Status: DC | PRN
  Filled 2024-03-16 (×2): qty 0.5

## 2024-03-16 MED ORDER — HYDROMORPHONE HCL 1 MG/ML IJ SOLN
0.5000 mg | INTRAMUSCULAR | Status: DC | PRN
  Administered 2024-03-16 – 2024-03-27 (×93): 0.5 mg via INTRAVENOUS
  Filled 2024-03-16 (×87): qty 0.5

## 2024-03-16 MED ORDER — CHLORHEXIDINE GLUCONATE CLOTH 2 % EX PADS
6.0000 | MEDICATED_PAD | Freq: Every day | CUTANEOUS | Status: DC
  Administered 2024-03-17 – 2024-03-22 (×6): 6 via TOPICAL

## 2024-03-16 MED ORDER — ACETAMINOPHEN 325 MG PO TABS
650.0000 mg | ORAL_TABLET | ORAL | Status: DC | PRN
  Administered 2024-03-18: 650 mg via ORAL
  Filled 2024-03-16: qty 2

## 2024-03-16 MED ORDER — ACETAMINOPHEN 650 MG RE SUPP: 650.0000 mg | RECTAL | Status: DC | PRN

## 2024-03-16 MED ORDER — HYDROMORPHONE HCL 1 MG/ML IJ SOLN
0.5000 mg | INTRAMUSCULAR | Status: DC | PRN
  Administered 2024-03-16: 0.5 mg via INTRAVENOUS

## 2024-03-16 NOTE — Plan of Care (Signed)
  Problem: Clinical Measurements: Goal: Ability to maintain clinical measurements within normal limits will improve Outcome: Progressing Goal: Will remain free from infection Outcome: Progressing Goal: Diagnostic test results will improve Outcome: Progressing Goal: Respiratory complications will improve Outcome: Progressing   Problem: Safety: Goal: Ability to remain free from injury will improve Outcome: Progressing   Problem: Metabolic: Goal: Ability to maintain appropriate glucose levels will improve Outcome: Progressing   Problem: Intracerebral Hemorrhage Tissue Perfusion: Goal: Complications of Intracerebral Hemorrhage will be minimized Outcome: Progressing   Problem: Health Behavior/Discharge Planning: Goal: Goals will be collaboratively established with patient/family Outcome: Progressing   Problem: Self-Care: Goal: Ability to communicate needs accurately will improve Outcome: Progressing   Problem: Nutrition: Goal: Risk of aspiration will decrease Outcome: Progressing   Problem: Fluid Volume: Goal: Compliance with measures to maintain balanced fluid volume will improve Outcome: Progressing   Problem: Clinical Measurements: Goal: Complications related to the disease process, condition or treatment will be avoided or minimized Outcome: Progressing

## 2024-03-16 NOTE — Plan of Care (Signed)
   Problem: Activity: Goal: Risk for activity intolerance will decrease Outcome: Progressing   Problem: Coping: Goal: Level of anxiety will decrease Outcome: Progressing   Problem: Elimination: Goal: Will not experience complications related to bowel motility Outcome: Progressing   Problem: Safety: Goal: Ability to remain free from injury will improve Outcome: Progressing

## 2024-03-16 NOTE — Progress Notes (Signed)
  [facility] Progress Note  Subjective:  Seen in room C/o headaches    Vitals:   03/16/24 0000 03/16/24 0500 03/16/24 0617 03/16/24 0746  BP:   (!) 173/71 (!) 134/59  Pulse: 89  94 98  Resp: 15  13 13   Temp:    99 F (37.2 C)  TempSrc:    Oral  SpO2: 96%  95% 99%  Weight:  52 kg    Height:        Exam: General: Lying in bed, no distress Heart: Normal rate, murmur present, no rub Lungs: bilateral chest rise, no increased work of breathing Abdomen: soft, nontender Extremities: No lower extremity edema  Dialysis Access:  RUE AVF +t/b   OP HD:  MWF AF 3h  B400   57.2kg  2K   AVF  Heparin  none - Hectoral 4mcg IV q HD - Mircera 150mcg IV q 2 weeks (last given 6/9 -> due 6/23)   Assessment/Plan: Intracerebral hemorrhage: C/b hydrocephalus, 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 TCDs with evidence of vasospasm 02/11/24. Followed closely by neurosurgery, evd removed 6/30 CVA: New CVA found on 7/1 ESRD: MWF HD. S/p CRRT 6/12-6/15, then iHD. HD Friday. Next HD Monday. Pain meds ordered prn for during HD to try and keep her from signing off early.  Hyperkalemia: resolved w/ TF changed to Nepro Volume: no vol excess on exam, down 5 kg or so here HTN: bp's good, 140/80 range, not on any bp meds Anemia of ESRD: Hgb 8-10 here.Getting darbe at 60 micrograms sq weekly. Iron  adequate Secondary HPTH: Ca and Phos high, VDRA stopped 6/21, added sevelamer  via tube. PTH 115.  Could consider low-dose Sensipar .  Continue to monitor for now because hypercalcemia likely a minimal problem at this point.  Pancreas/ kidney transplant (2014): for the functioning pancreas pt is getting her home dosing of CyA 225mg  qam and 200 mg qpm + Cellcept  and prednisone . CyA level 7/09 was 109 (goal 100-200) which is in range.      Myer Fret MD  [facility] 03/16/2024, 12:09 PM  Recent Labs  Lab 03/15/24 0637 03/16/24 0652  HGB 8.6* 8.1*  ALBUMIN 2.7* 2.9*  CALCIUM  9.9 10.4*  PHOS 4.4 5.2*   CREATININE 3.84* 5.58*  K 3.9 3.9   No results for input(s): IRON , TIBC, FERRITIN in the last 168 hours. Inpatient medications:  acetaminophen   650 mg Oral Q4H   Or   acetaminophen   650 mg Rectal Q4H   aspirin   81 mg Oral Daily   Chlorhexidine  Gluconate Cloth  6 each Topical Q0600   Chlorhexidine  Gluconate Cloth  6 each Topical Q0600   cycloSPORINE   200 mg Oral QHS   cycloSPORINE   225 mg Oral Daily   darbepoetin (ARANESP ) injection - DIALYSIS  60 mcg Subcutaneous Q Fri-1800   feeding supplement  237 mL Oral BID BM   feeding supplement (NEPRO CARB STEADY)  1,000 mL Per Tube Q24H   heparin  injection (subcutaneous)  5,000 Units Subcutaneous Q8H   insulin  aspart  0-9 Units Subcutaneous Q4H   lacosamide   100 mg Oral BID   metoCLOPramide   5 mg Oral TID AC   Or   metoCLOPramide   5 mg Per Tube TID AC   multivitamin  1 tablet Oral QHS   mycophenolate   500 mg Oral BID   predniSONE   15 mg Oral Q breakfast   sevelamer  carbonate  2.4 g Oral TID WC   sodium bicarbonate   1,300 mg Oral TID   sodium chloride  flush  3 mL Intravenous Q12H     albuterol , artificial tears, atropine , butalbital -acetaminophen -caffeine , Gerhardt's butt cream, hydrALAZINE , HYDROmorphone  (DILAUDID ) injection, iohexol , labetalol , loperamide  HCl, naLOXone  (NARCAN )  injection, nitroGLYCERIN , ondansetron  (ZOFRAN ) IV, mouth rinse, prochlorperazine , simethicone 

## 2024-03-16 NOTE — Progress Notes (Signed)
 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 Hypertensive in ED SBP 240s.  CT scan suggested colitis.  Patient's mentation continued to worsen. CT head was subsequently done which revealed intracranial hemorrhage.  Patient was admitted to the ICU.     Consultants: Critical care medicine.  Neurosurgery.  Neurology.  Nephrology   Significant Hospital Events:  6/11 ED w n/v AMS. Abx IVF for colitis 6/12 Admit. AMS decr LOC. Incr O2 req. PCCM consult and code stroke called seen w rapid response nephro NP then neuro  6/13 Intubated with EVD, responding to commands  6/14 Extubated 6/17 acute L cerebellar infarct. Keppra  dc  6/18 Dialysis, fevering, EVD at 10 (129 mL last 24 hrs) 6/19 Narcotic dependence a driving factor. +Vasospasm on TCD. CTA with evolving Left cerebellar infarct, no HT or mass effect, substantially decreased caliber of multiple intracranial arteries and bilateral Circle-of[place] branches (especially: Distal left V4, supraclinoid ICAs, MCA and ACA origins), appearance c/w widespread Vasospasm, Vasoconstriction. No associated large vessel occlusion. 6/21 CSF sampline from EVD -- WBC 244 RBC 19000, cx ngtd  6/25: diagnostic angio today with nsgy - negative 6/26 evd leaking when clamped  6/30 evd removed, HD overnight, HTN improved, 7 days cefazolin  complete 7/1 more lethargic> minimal response to narcan  x2, oxy stopped, EEG neg, MRI - new small left parietal cva, cortrak placed postpyloric, vagal episodes with N/V 7/2 iHD, vimpat  started 7/9 she appears agitated.  IVs were removed.  LP to be initiated to evaluate her opening pressure and to ensure that she does not have meningitis if this is still pending to be done. 7/10 LP  done at bedside by Neurosurgery. Cortrak was removed yesterday evening and replaced by Fluoro team today.  7/11 Cortrak obstructed and reomved and had to be replaced by Flouro. Calorie Count initiated. Undergoing Dialysis today  7/12: Having chest discomfort. Palliative Care Consulted    Assessment and Plan:  Intracranial hemorrhage with brain compression/subarachnoid hemorrhage/communicating hydrocephalus -Patient seen by neurology and neurosurgery.   -Underwent placement of right frontal pressure ventriculostomy via bur hole. -Ventriculostomy was removed on 6/30. -Seems to be stable from neurological standpoint except for headaches.  Continue scheduled Tylenol  and as needed Fioricet .  Also noted to be on as needed morphine .  Could add as needed oxycodone . -Underwent CT head the day before yesterday as ordered by neurosurgery.  This raised concern for a new infarct.  Subsequently MRI brain was done.  This showed small foci of hemorrhage in the posterior left cerebellum without evidence for acute infarct.  Punctate focus of acute infarct involving the medial left occipital cortex was seen.  Evolving subacute infarct in the left parietal lobe seen.  Trace blood products were noted. -Seen by neurosurgery and they are concerned about the hydrocephalus.  They have ordered LP to measure opening pressure and to ensure that she does not have meningitis with her prolonged intraventricular catheter..  IR was consulted to do this but the IR team advised that typically image guided LP is reserved for those who can and have failed attempts on the floor. Neurosurgery now doing LP bedside today and needle placed into the L3-4 interspace.  -The neurosurgical team feels that she would perhaps benefit from a ventriculoperitoneal shunt but want  to rule out meningitis and measure her spinal fluid pressure first and they were low and not indicative of Meningitis. NSGY does not believe she would benefit from a  Shunt. -Palliative Care consulted for GOC discussion    Concern for seizure activity: Seen by Neurology.  Underwent EEG.  Patient was started on Lacosamide  100 mg po BID.  Improvement noted.   Vasospasm with left cerebellar infarct: Seen by Neurology.  Noted to be on aspirin . LDL 63.  HbA1c 4.2. Patient noted to be on midodrine  for BP augmentation but now due persistent hypotension midodrine  has been discontinued. PT and OT following.  SNF being pursued. -A small punctate focus of acute infarct was noted in the medial left occipital cortex.  Per neurosurgery there is nothing to be done for that.  Continue current management.  Will run this by neurology as well.   Essential Hypertension: Midodrine  was discontinued.  Blood pressure did drop with hemodialysis but now elevated.  Pain could be contributing to high readings.  She seems to have quite brittle blood pressure.  Would like to avoid antihypertensives as much as possible.  Continue as needed medications for now. CTM BP per Protocol. Last BP reading was 165/76   Chest Pain r/o ACS: Continues to have Chest Discomfort and Pain. Troponin Elevated mildly and went from 68 -> 63. EKG showed some T Wave Inversions. Give NTG. Check LTD Echo.   End-stage renal disease on hemodialysis/Hyperkalemia: History of failed transplant is present.  She is chronically immunosuppressed with CellCept , Cyclosporine , and Prednisone  BUN/Cr Trend: Recent Labs  Lab 03/10/24 0222 03/11/24 0643 03/12/24 0512 03/13/24 0922 03/14/24 0545 03/15/24 0637 03/16/24 0652  BUN 60* 33* 45* 25* 35* 27* 41*  CREATININE 6.78* 4.36* 5.63* 4.13* 5.50* 3.84* 5.58*  -Further management Management per nephrology. Patient is on sodium bicarbonate . K+ Trend is improving:  Recent Labs  Lab 03/10/24 1100 03/11/24 0643 03/12/24 0512 03/13/24 0922 03/14/24 0545 03/15/24 0637 03/16/24 0652  K 4.4 5.6* 5.8* 4.8 4.5 3.9 3.9  -Potassium level improved with hemodialysis. Nephrology  changed her tube feedings to Nepro and dialyzed the patient 7/9 and she is underwent Dialysis on 7/11 with nex planned HD 03/17/24 -Avoid Nephrotoxic Medications, Contrast Dyes, Hypotension and Dehydration to Ensure Adequate Renal Perfusion and will need to Renally Adjust Meds -Continue to Monitor and Trend Renal Function carefully and repeat CMP in the AM   Bradycardia: Secondary to vagal response. Resolved.   Oropharyngeal dysphagia: Noted to have Cortak feeding tube but was removed overnight.  Dietitian following.  Last seen by speech therapy on 7/2.  Patient's mentation has improved.  SLP evaluated and recommending FULL Liquid Diet.  The dietitian has recommended feeding tube to be placed postpyloric to avoid emesis  and this was done. If she can tolerate a full liquid diet will see how well her nutritional intake is. If continues to have N/V the dietitians are recommending discussing with her about with a venting G-tube in the future.  Will see how she does with the full liquid diet for now and replace Cortrak via Fluoro team.  -Will need repeat KUB intermittently   Colitis: Stable.  Nausea/Vomiting/Gastroparesis: Was going to Increase Metoclopramide  to 10 mg q8h however contraindicated due to her ESRD. C/w Supportive care and c/w Antiemetics w. Ondansetron  IV 4 mg q6hprn.     Status post Pancreatic Transplant/Diabetes: C/w Sensitive Novology q4h. CTM CBGs.  CBGs ranging from 100-123   Anemia of Chronic Disease/ Normocytic Anemia: Relatively Stable. Hgb/Hct Trend:  Recent Labs  Lab 03/08/24 0338 03/10/24 0222 03/12/24 0512 03/13/24 0922 03/14/24 0545 03/15/24 0637 03/16/24 0652  HGB 9.5* 10.1* 9.1* 10.1* 9.0* 8.6* 8.1*  HCT 28.6* 30.7* 27.6* 30.7* 27.6* 26.8* 24.7*  MCV 97.9 97.2 95.8 96.8 98.2 99.3 97.2  -Getting Darbepoetin Alfa  60 mcg sq weekly -CTM for S/Sx of Bleeding; No overt bleeding noted. Repeat CBC in the AM    Moderate Protein Calorie Malnutrition / Underweight:  Nutrition Status: Nutrition Problem: Moderate Malnutrition Etiology: chronic illness (ESRD on HD/DM) Signs/Symptoms: mild fat depletion, mild muscle depletion Interventions: Tube feeding Estimated body mass index is 17.69 kg/m as calculated from the following:   Height as of this encounter: 5' 7.5 (1.715 m).   Weight as of this encounter: 52 kg. -Calorie Count initiated.    DVT prophylaxis: heparin  injection 5,000 Units Start: 03/14/24 0600 SCDs Start: 02/14/24 0725    Code Status: Full Code Family Communication: No family present @ bedside  Disposition Plan:  Level of care: Progressive Status is: Inpatient Remains inpatient appropriate because:  Needs further clinical improvement and tolerance of diet    Consultants:  Nephrology Neurosurgery Neurology Interventional Radiology Palliative Care Medicine  Procedures:  As delineated as above  Antimicrobials:  Anti-infectives (From admission, onward)    Start     Dose/Rate Route Frequency Ordered Stop   02/27/24 1200  vancomycin  (VANCOREADY) IVPB 500 mg/100 mL  Status:  Discontinued        500 mg 100 mL/hr over 60 Minutes Intravenous Every M-W-F (Hemodialysis) 02/25/24 1129 02/25/24 1436   02/26/24 2200  ceFAZolin  (ANCEF ) IVPB 1 g/50 mL premix        1 g 100 mL/hr over 30 Minutes Intravenous Every 24 hours 02/26/24 1009 03/03/24 2140   02/25/24 1530  vancomycin  (VANCOREADY) IVPB 500 mg/100 mL        500 mg 100 mL/hr over 60 Minutes Intravenous Every M-W-F (Hemodialysis) 02/25/24 1436 02/25/24 1934   02/23/24 1800  meropenem  (MERREM ) 1 g in sodium chloride  0.9 % 100 mL IVPB  Status:  Discontinued        1 g 200 mL/hr over 30 Minutes Intravenous Every 24 hours 02/23/24 1625 02/26/24 1009   02/23/24 1800  vancomycin  (VANCOREADY) IVPB 1250 mg/250 mL        1,250 mg 166.7 mL/hr over 90 Minutes Intravenous  Once 02/23/24 1625 02/23/24 1933   02/17/24 2200  piperacillin -tazobactam (ZOSYN ) IVPB 2.25 g        2.25 g 100 mL/hr  over 30 Minutes Intravenous Every 8 hours 02/17/24 1412 02/21/24 2336   02/14/24 1800  vancomycin  (VANCOREADY) IVPB 2000 mg/400 mL        2,000 mg 200 mL/hr over 120 Minutes Intravenous  Once 02/14/24 1709 02/14/24 2048   02/14/24 1622  piperacillin -tazobactam (ZOSYN ) IVPB 3.375 g  Status:  Discontinued        3.375 g 100 mL/hr over 30 Minutes Intravenous Every 6 hours 02/14/24 1622 02/17/24 1412   02/14/24 1545  piperacillin -tazobactam (ZOSYN ) IVPB 3.375 g  Status:  Discontinued        3.375 g 12.5 mL/hr over 240 Minutes Intravenous Every 8 hours 02/14/24 1451 02/14/24 1453   02/14/24 1545  piperacillin -tazobactam (ZOSYN ) IVPB 2.25 g  Status:  Discontinued        2.25 g 100 mL/hr over 30 Minutes Intravenous Every 8 hours 02/14/24 1453 02/14/24 1622   02/14/24 0800  cefTRIAXone  (ROCEPHIN ) 2 g in sodium chloride  0.9 % 100 mL IVPB  Status:  Discontinued        2 g 200 mL/hr over 30 Minutes Intravenous Every 24 hours 02/14/24 0730 02/14/24 1451   02/14/24 0800  metroNIDAZOLE  (FLAGYL ) IVPB 500 mg  Status:  Discontinued        500 mg 100 mL/hr over 60 Minutes Intravenous Every 12 hours 02/14/24 0730 02/14/24 1451       Subjective: Seen and examined at bedside and continued to complain of pain .  Her nausea was okay today.  States that she has been eating in order to potato soup.  Feels weak.  No other concerns or complaints at this time.  Objective: Vitals:   03/16/24 0746 03/16/24 1225 03/16/24 1608 03/16/24 1952  BP: (!) 134/59 (!) 151/68 (!) 191/79 (!) 187/77  Pulse: 98 98 (!) 102   Resp: 13 12 14 13   Temp: 99 F (37.2 C) 99.3 F (37.4 C) 98.8 F (37.1 C) 99 F (37.2 C)  TempSrc: Oral Oral Oral Oral  SpO2: 99% 92% 91%   Weight:      Height:        Intake/Output Summary (Last 24 hours) at 03/16/2024 2119 Last data filed at 03/16/2024 2027 Gross per 24 hour  Intake 972 ml  Output 95 ml  Net 877 ml   Filed Weights   03/14/24 1755 03/15/24 0500 03/16/24 0500  Weight: 48.6  kg 49.8 kg 52 kg   Examination: Physical Exam:  Constitutional: Underweight thin chronically ill-appearing female who appears a little uncomfortable Respiratory: Diminished to auscultation bilaterally, no wheezing, rales, rhonchi or crackles. Normal respiratory effort and patient is not tachypenic. No accessory muscle use.  Unlabored breathing Cardiovascular: RRR, no murmurs / rubs / gallops. S1 and S2 auscultated. No extremity edema.  Abdomen: Soft, non-tender, nondistended. Bowel sounds positive.  GU: Deferred. Musculoskeletal: No clubbing / cyanosis of digits/nails. No joint deformity upper and lower extremities. Skin: No rashes, lesions, ulcers on limited skin evaluation denies that she had her on the right side from her surgery. No induration; Warm and dry.  Neurologic: Cross eyed but cranial nerves II through XII grossly intact. Psychiatric: Appears anxious  Data Reviewed: I have personally reviewed following labs and imaging studies  CBC: Recent Labs  Lab 03/12/24 0512 03/13/24 0922 03/14/24 0545 03/15/24 0637 03/16/24 0652  WBC 4.3 9.8 8.4 6.6 4.2  NEUTROABS  --  7.8* 6.4 4.8 2.7  HGB 9.1* 10.1* 9.0* 8.6* 8.1*  HCT 27.6* 30.7* 27.6* 26.8* 24.7*  MCV 95.8 96.8 98.2 99.3 97.2  PLT 169 230 202 200 184   Basic Metabolic Panel: Recent Labs  Lab 03/12/24 0512 03/13/24 0922 03/14/24 0545 03/15/24 0637 03/16/24 0652  NA 127* 137 134* 136 135  K 5.8* 4.8 4.5 3.9 3.9  CL 83* 87* 86* 94* 90*  CO2 29 32 29 34* 30  GLUCOSE 107* 118* 107* 142* 117*  BUN 45* 25* 35* 27* 41*  CREATININE 5.63* 4.13* 5.50* 3.84* 5.58*  CALCIUM  9.7 10.7* 10.4* 9.9 10.4*  MG  --  2.9* 3.0* 2.6* 3.1*  PHOS  --  6.3* 6.6* 4.4 5.2*   GFR: Estimated Creatinine Clearance: 10.8 mL/min (A) (by C-G formula based on SCr of 5.58 mg/dL (H)). Liver Function Tests: Recent Labs  Lab 03/10/24 0222 03/13/24 0922 03/14/24 0545 03/15/24 0637 03/16/24 0652  AST 29 21 25 17 19   ALT 9 12 12 12 13    ALKPHOS 109 98 106 96 104  BILITOT 0.7 0.6 0.6 0.5 0.6  PROT 6.2* 7.1 7.0 6.4*  6.3*  ALBUMIN 2.9* 3.3* 3.2* 2.7* 2.9*   No results for input(s): LIPASE, AMYLASE in the last 168 hours. No results for input(s): AMMONIA in the last 168 hours. Coagulation Profile: No results for input(s): INR, PROTIME in the last 168 hours. Cardiac Enzymes: No results for input(s): CKTOTAL, CKMB, CKMBINDEX, TROPONINI in the last 168 hours. BNP (last 3 results) No results for input(s): PROBNP in the last 8760 hours. HbA1C: No results for input(s): HGBA1C in the last 72 hours. CBG: Recent Labs  Lab 03/16/24 0410 03/16/24 0750 03/16/24 1222 03/16/24 1606 03/16/24 2010  GLUCAP 95 128* 143* 150* 90   Lipid Profile: No results for input(s): CHOL, HDL, LDLCALC, TRIG, CHOLHDL, LDLDIRECT in the last 72 hours. Thyroid Function Tests: No results for input(s): TSH, T4TOTAL, FREET4, T3FREE, THYROIDAB in the last 72 hours. Anemia Panel: No results for input(s): VITAMINB12, FOLATE, FERRITIN, TIBC, IRON , RETICCTPCT in the last 72 hours. Sepsis Labs: No results for input(s): PROCALCITON, LATICACIDVEN in the last 168 hours.  Recent Results (from the past 240 hours)  CSF culture w Gram Stain     Status: None   Collection Time: 03/13/24 10:40 AM   Specimen: Back; Cerebrospinal Fluid  Result Value Ref Range Status   Specimen Description BACK  Final   Special Requests NONE  Final   Gram Stain NO WBC SEEN NO ORGANISMS SEEN CYTOSPIN SMEAR   Final   Culture   Final    NO GROWTH 3 DAYS Performed at [facility], [address] [place], [name] [address]    Report Status 03/16/2024 FINAL  Final    Radiology Studies: ECHOCARDIOGRAM LIMITED Result Date: 03/16/2024    ECHOCARDIOGRAM LIMITED REPORT   Patient Name:   [name] [name] Date of Exam: 03/16/2024 Medical Rec #:  [id number]          Height:       67.5 in Accession #:    [id number]          Weight:       114.6 lb Date of Birth:  Jul 02, 1981           BSA:          1.605 m Patient Age:    43 years           BP:           151/68 mmHg Patient Gender: F                  HR:           100 bpm. Exam Location:  Inpatient Procedure: Limited Echo, Limited Color Doppler and Cardiac Doppler (Both            Spectral and Color Flow Doppler were utilized during procedure). Indications:    Chest Pain  History:        Patient has prior history of Echocardiogram examinations.                 Signs/Symptoms:Chest Pain.  Sonographer:    Vella Key Referring Phys: [id number] Taura Lamarre LATIF [facility] IMPRESSIONS  1. Left ventricular ejection fraction, by estimation, is 60 to 65%. The left ventricle has normal function. The left ventricle has no regional wall motion abnormalities. There is mild left ventricular hypertrophy. Left ventricular diastolic parameters are indeterminate.  2. Right ventricular systolic function is normal. The right ventricular size is normal.  3. A small pericardial effusion is present.  4. Trivial mitral valve regurgitation.  5. The aortic  valve is tricuspid. Aortic valve regurgitation is not visualized. Aortic valve sclerosis/calcification is present, without any evidence of aortic stenosis.  6. The inferior vena cava is normal in size with greater than 50% respiratory variability, suggesting right atrial pressure of 3 mmHg. FINDINGS  Left Ventricle: Left ventricular ejection fraction, by estimation, is 60 to 65%. The left ventricle has normal function. The left ventricle has no regional wall motion abnormalities. The left ventricular internal cavity size was normal in size. There is  mild left ventricular hypertrophy. Left ventricular diastolic parameters are indeterminate. Right Ventricle: The right ventricular size is normal. Right vetricular wall thickness was not assessed. Right ventricular systolic function is normal. Left Atrium: Left atrial size was normal in size. Right Atrium: Right atrial size  was normal in size. Pericardium: A small pericardial effusion is present. Mitral Valve: There is mild thickening of the mitral valve leaflet(s). Mild to moderate mitral annular calcification. Trivial mitral valve regurgitation. Tricuspid Valve: The tricuspid valve is normal in structure. Aortic Valve: The aortic valve is tricuspid. Aortic valve regurgitation is not visualized. Aortic valve sclerosis/calcification is present, without any evidence of aortic stenosis. Pulmonic Valve: The pulmonic valve was normal in structure. Pulmonic valve regurgitation is not visualized. Aorta: The aortic root and ascending aorta are structurally normal, with no evidence of dilitation. Venous: The inferior vena cava is normal in size with greater than 50% respiratory variability, suggesting right atrial pressure of 3 mmHg. IAS/Shunts: No atrial level shunt detected by color flow Doppler. LEFT VENTRICLE PLAX 2D LVIDd:         3.80 cm LVIDs:         2.60 cm LV PW:         1.20 cm LV IVS:        1.20 cm LVOT diam:     1.60 cm LV SV:         45 LV SV Index:   28 LVOT Area:     2.01 cm  LV Volumes (MOD) LV vol d, MOD A2C: 83.7 ml LV vol d, MOD A4C: 98.2 ml LV vol s, MOD A2C: 28.2 ml LV vol s, MOD A4C: 35.2 ml LV SV MOD A2C:     55.5 ml LV SV MOD A4C:     98.2 ml LV SV MOD BP:      61.3 ml LEFT ATRIUM           Index LA diam:      3.30 cm 2.06 cm/m LA Vol (A4C): 46.6 ml 29.03 ml/m  AORTIC VALVE LVOT Vmax:   125.00 cm/s LVOT Vmean:  91.800 cm/s LVOT VTI:    0.225 m  AORTA Ao Root diam: 2.60 cm  SHUNTS Systemic VTI:  0.22 m Systemic Diam: 1.60 cm Vina Gull MD Electronically signed by Vina Gull MD Signature Date/Time: 03/16/2024/5:27:59 PM    Final    Scheduled Meds:  aspirin   81 mg Oral Daily   [START ON 03/17/2024] Chlorhexidine  Gluconate Cloth  6 each Topical Q0600   cycloSPORINE   200 mg Oral QHS   cycloSPORINE   225 mg Oral Daily   darbepoetin (ARANESP ) injection - DIALYSIS  60 mcg Subcutaneous Q Fri-1800   feeding supplement   237 mL Oral BID BM   feeding supplement (NEPRO CARB STEADY)  1,000 mL Per Tube Q24H   heparin  injection (subcutaneous)  5,000 Units Subcutaneous Q8H   insulin  aspart  0-9 Units Subcutaneous Q4H   lacosamide   100 mg Oral BID   metoCLOPramide   5  mg Oral TID AC   Or   metoCLOPramide   5 mg Per Tube TID AC   multivitamin  1 tablet Oral QHS   mycophenolate   500 mg Oral BID   predniSONE   15 mg Oral Q breakfast   sevelamer  carbonate  2.4 g Oral TID WC   sodium bicarbonate   1,300 mg Oral TID   sodium chloride  flush  3 mL Intravenous Q12H   Continuous Infusions:   LOS: 31 days   Alejandro Marker, DO [name] Available via Epic secure chat 7am-7pm After these hours, please refer to coverage provider listed on [email or web address] 03/16/2024, 9:19 PM

## 2024-03-17 DIAGNOSIS — I609 Nontraumatic subarachnoid hemorrhage, unspecified: Secondary | ICD-10-CM | POA: Diagnosis not present

## 2024-03-17 DIAGNOSIS — Z8639 Personal history of other endocrine, nutritional and metabolic disease: Secondary | ICD-10-CM | POA: Diagnosis not present

## 2024-03-17 DIAGNOSIS — K529 Noninfective gastroenteritis and colitis, unspecified: Secondary | ICD-10-CM | POA: Diagnosis not present

## 2024-03-17 DIAGNOSIS — I160 Hypertensive urgency: Secondary | ICD-10-CM | POA: Diagnosis not present

## 2024-03-17 LAB — COMPREHENSIVE METABOLIC PANEL WITH GFR
ALT: 11 U/L (ref 0–44)
AST: 17 U/L (ref 15–41)
Albumin: 2.8 g/dL — ABNORMAL LOW (ref 3.5–5.0)
Alkaline Phosphatase: 114 U/L (ref 38–126)
Anion gap: 18 — ABNORMAL HIGH (ref 5–15)
BUN: 55 mg/dL — ABNORMAL HIGH (ref 6–20)
CO2: 31 mmol/L (ref 22–32)
Calcium: 10.2 mg/dL (ref 8.9–10.3)
Chloride: 86 mmol/L — ABNORMAL LOW (ref 98–111)
Creatinine, Ser: 6.96 mg/dL — ABNORMAL HIGH (ref 0.44–1.00)
GFR, Estimated: 7 mL/min — ABNORMAL LOW (ref >60)
Glucose, Bld: 103 mg/dL — ABNORMAL HIGH (ref 70–99)
Potassium: 4.2 mmol/L (ref 3.5–5.1)
Sodium: 135 mmol/L (ref 135–145)
Total Bilirubin: 0.6 mg/dL (ref 0.0–1.2)
Total Protein: 6.3 g/dL — ABNORMAL LOW (ref 6.5–8.1)

## 2024-03-17 LAB — GLUCOSE, CAPILLARY
Glucose-Capillary: 107 mg/dL — ABNORMAL HIGH (ref 70–99)
Glucose-Capillary: 107 mg/dL — ABNORMAL HIGH (ref 70–99)
Glucose-Capillary: 132 mg/dL — ABNORMAL HIGH (ref 70–99)
Glucose-Capillary: 142 mg/dL — ABNORMAL HIGH (ref 70–99)

## 2024-03-17 LAB — CBC WITH DIFFERENTIAL/PLATELET
Abs Immature Granulocytes: 0.03 K/uL (ref 0.00–0.07)
Basophils Absolute: 0 K/uL (ref 0.0–0.1)
Basophils Relative: 0 %
Eosinophils Absolute: 0.1 K/uL (ref 0.0–0.5)
Eosinophils Relative: 2 %
HCT: 25.2 % — ABNORMAL LOW (ref 36.0–46.0)
Hemoglobin: 8.4 g/dL — ABNORMAL LOW (ref 12.0–15.0)
Immature Granulocytes: 1 %
Lymphocytes Relative: 21 %
Lymphs Abs: 1 K/uL (ref 0.7–4.0)
MCH: 32.1 pg (ref 26.0–34.0)
MCHC: 33.3 g/dL (ref 30.0–36.0)
MCV: 96.2 fL (ref 80.0–100.0)
Monocytes Absolute: 0.5 K/uL (ref 0.1–1.0)
Monocytes Relative: 10 %
Neutro Abs: 3.1 K/uL (ref 1.7–7.7)
Neutrophils Relative %: 66 %
Platelets: 189 K/uL (ref 150–400)
RBC: 2.62 MIL/uL — ABNORMAL LOW (ref 3.87–5.11)
RDW: 14.8 % (ref 11.5–15.5)
WBC: 4.6 K/uL (ref 4.0–10.5)
nRBC: 0 % (ref 0.0–0.2)

## 2024-03-17 LAB — MAGNESIUM: Magnesium: 3.3 mg/dL — ABNORMAL HIGH (ref 1.7–2.4)

## 2024-03-17 LAB — PHOSPHORUS: Phosphorus: 5.3 mg/dL — ABNORMAL HIGH (ref 2.5–4.6)

## 2024-03-17 MED ORDER — HYDROMORPHONE HCL 1 MG/ML IJ SOLN
INTRAMUSCULAR | Status: AC
  Filled 2024-03-17: qty 0.5

## 2024-03-17 MED ORDER — ONDANSETRON HCL 4 MG/2ML IJ SOLN
INTRAMUSCULAR | Status: AC
  Filled 2024-03-17: qty 2

## 2024-03-17 NOTE — Progress Notes (Signed)
 Hd Tx End

## 2024-03-17 NOTE — Progress Notes (Signed)
 Providing Compassionate, Quality Care - Together   Subjective: Patient reports headache during today's assessment.  Objective: Vital signs in last 24 hours: Temp:  [98.1 F (36.7 C)-99.3 F (37.4 C)] 98.4 F (36.9 C) (07/14 0413) Pulse Rate:  [94-102] 94 (07/13 2326) Resp:  [12-20] 14 (07/14 0413) BP: (112-191)/(56-79) 172/77 (07/14 0413) SpO2:  [91 %-95 %] 95 % (07/13 2326) Weight:  [54 kg] 54 kg (07/14 0500)  Intake/Output from previous day: 07/13 0701 - 07/14 0700 In: 971.3 [NG/GT:971.3] Out: 35 [Stool:35] Intake/Output this shift: Total I/O In: -  Out: 35 [Stool:35]  Alert, oriented to self and place, some confusion PERRLA, dysconjugate gaze Speech dysarthric MAE, follows commands  Lab Results: Recent Labs    03/16/24 0652 03/17/24 0542  WBC 4.2 4.6  HGB 8.1* 8.4*  HCT 24.7* 25.2*  PLT 184 189   BMET Recent Labs    03/16/24 0652 03/17/24 0542  NA 135 135  K 3.9 4.2  CL 90* 86*  CO2 30 31  GLUCOSE 117* 103*  BUN 41* 55*  CREATININE 5.58* 6.96*  CALCIUM  10.4* 10.2    Studies/Results: ECHOCARDIOGRAM LIMITED Result Date: 03/16/2024    ECHOCARDIOGRAM LIMITED REPORT   Patient Name:   [name] [name] Date of Exam: 03/16/2024 Medical Rec #:  [id number]          Height:       67.5 in Accession #:    [id number]         Weight:       114.6 lb Date of Birth:  1980/10/04           BSA:          1.605 m Patient Age:    43 years           BP:           151/68 mmHg Patient Gender: F                  HR:           100 bpm. Exam Location:  Inpatient Procedure: Limited Echo, Limited Color Doppler and Cardiac Doppler (Both            Spectral and Color Flow Doppler were utilized during procedure). Indications:    Chest Pain  History:        Patient has prior history of Echocardiogram examinations.                 Signs/Symptoms:Chest Pain.  Sonographer:    Vella Key Referring Phys: [id number] OMAIR LATIF [facility] IMPRESSIONS  1. Left ventricular ejection fraction, by  estimation, is 60 to 65%. The left ventricle has normal function. The left ventricle has no regional wall motion abnormalities. There is mild left ventricular hypertrophy. Left ventricular diastolic parameters are indeterminate.  2. Right ventricular systolic function is normal. The right ventricular size is normal.  3. A small pericardial effusion is present.  4. Trivial mitral valve regurgitation.  5. The aortic valve is tricuspid. Aortic valve regurgitation is not visualized. Aortic valve sclerosis/calcification is present, without any evidence of aortic stenosis.  6. The inferior vena cava is normal in size with greater than 50% respiratory variability, suggesting right atrial pressure of 3 mmHg. FINDINGS  Left Ventricle: Left ventricular ejection fraction, by estimation, is 60 to 65%. The left ventricle has normal function. The left ventricle has no regional wall motion abnormalities. The left ventricular internal cavity size was normal in size. There is  mild  left ventricular hypertrophy. Left ventricular diastolic parameters are indeterminate. Right Ventricle: The right ventricular size is normal. Right vetricular wall thickness was not assessed. Right ventricular systolic function is normal. Left Atrium: Left atrial size was normal in size. Right Atrium: Right atrial size was normal in size. Pericardium: A small pericardial effusion is present. Mitral Valve: There is mild thickening of the mitral valve leaflet(s). Mild to moderate mitral annular calcification. Trivial mitral valve regurgitation. Tricuspid Valve: The tricuspid valve is normal in structure. Aortic Valve: The aortic valve is tricuspid. Aortic valve regurgitation is not visualized. Aortic valve sclerosis/calcification is present, without any evidence of aortic stenosis. Pulmonic Valve: The pulmonic valve was normal in structure. Pulmonic valve regurgitation is not visualized. Aorta: The aortic root and ascending aorta are structurally normal,  with no evidence of dilitation. Venous: The inferior vena cava is normal in size with greater than 50% respiratory variability, suggesting right atrial pressure of 3 mmHg. IAS/Shunts: No atrial level shunt detected by color flow Doppler. LEFT VENTRICLE PLAX 2D LVIDd:         3.80 cm LVIDs:         2.60 cm LV PW:         1.20 cm LV IVS:        1.20 cm LVOT diam:     1.60 cm LV SV:         45 LV SV Index:   28 LVOT Area:     2.01 cm  LV Volumes (MOD) LV vol d, MOD A2C: 83.7 ml LV vol d, MOD A4C: 98.2 ml LV vol s, MOD A2C: 28.2 ml LV vol s, MOD A4C: 35.2 ml LV SV MOD A2C:     55.5 ml LV SV MOD A4C:     98.2 ml LV SV MOD BP:      61.3 ml LEFT ATRIUM           Index LA diam:      3.30 cm 2.06 cm/m LA Vol (A4C): 46.6 ml 29.03 ml/m  AORTIC VALVE LVOT Vmax:   125.00 cm/s LVOT Vmean:  91.800 cm/s LVOT VTI:    0.225 m  AORTA Ao Root diam: 2.60 cm  SHUNTS Systemic VTI:  0.22 m Systemic Diam: 1.60 cm Vina Gull MD Electronically signed by Vina Gull MD Signature Date/Time: 03/16/2024/5:27:59 PM    Final     Assessment/Plan: Patient suffered an intraventricular hemorrhage, hydrocephalus, cerebral infarct, left parietal infarct. 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 CTA performed by Dr. Lanis on 02/27/2024 was negative for vascular abnormality. EVD removed 03/03/2024. LP performed at the bedside on 03/13/2024. Very low opening pressure. CSF was negative for growth, no WBC, and total protein was 39.   LOS: 32 days   -Continue supportive efforts. -Palliative care was consulted 03/15/2024 for goals of care.  I am in communication with my attending and they agree with the plan for this patient.   Gerard Beck, DNP, AGNP-C Nurse Practitioner  [facility] [address], [place], [name] [address] P: [phone]    F: [phone]  03/17/2024, 11:20 AM

## 2024-03-17 NOTE — Plan of Care (Signed)
  Problem: Clinical Measurements: Goal: Ability to maintain clinical measurements within normal limits will improve Outcome: Progressing Goal: Will remain free from infection Outcome: Progressing Goal: Diagnostic test results will improve Outcome: Progressing Goal: Respiratory complications will improve Outcome: Progressing   Problem: Activity: Goal: Risk for activity intolerance will decrease Outcome: Progressing   Problem: Safety: Goal: Ability to remain free from injury will improve Outcome: Progressing   Problem: Skin Integrity: Goal: Risk for impaired skin integrity will decrease Outcome: Progressing   Problem: Metabolic: Goal: Ability to maintain appropriate glucose levels will improve Outcome: Progressing   Problem: Education: Goal: Knowledge of secondary prevention will improve (MUST DOCUMENT ALL) Outcome: Progressing Goal: Knowledge of patient specific risk factors will improve (DELETE if not current risk factor) Outcome: Progressing   Problem: Intracerebral Hemorrhage Tissue Perfusion: Goal: Complications of Intracerebral Hemorrhage will be minimized Outcome: Progressing   Problem: Health Behavior/Discharge Planning: Goal: Goals will be collaboratively established with patient/family Outcome: Progressing   Problem: Self-Care: Goal: Ability to communicate needs accurately will improve Outcome: Progressing   Problem: Nutrition: Goal: Risk of aspiration will decrease Outcome: Progressing   Problem: Education: Goal: Knowledge of disease and its progression will improve Outcome: Progressing Goal: Individualized Educational Video(s) Outcome: Progressing

## 2024-03-17 NOTE — Progress Notes (Addendum)
 Calorie Count Note  Pt still with ongoing nausea and vomiting. Per RN more like dry heaving sometimes. Pt states she is hungry but is scared to eat because she does not want to throw up. Eats 100% of soup but nothing else. Refuses supplements, because they make her vomit. RD will add extra servings of soup to pt's meal trays to see if pt will increase intake. RD attempted to explain pt may require  J-tube for feeds with vented G-tube if she does not increase her intake or if she cannot tolerate foods by PO. Pt did not seem to understand the gravity of her situation. MD consulted Palliative for GOC. [place]   48 hour calorie count ordered.  Diet: Full liquids, thin Supplements: Ensure Enlive   Day 1 Results 7/10: Breakfast: 0% Lunch: pt refused  Dinner: no documentation Supplements: documented as not given/refused   Total intake 0 kcal (0% of minimum estimated needs)  0 protein (0% of minimum estimated needs)  Day 2 results 7/11: Breakfast: 75% potato soup 123 calories, 3.5 gm protein (pt vomited shortly after meal per RN) Lunch: 25% potato soup (30 kcal, 1 gm protein) Dinner: 100% potato soup (150 kcal, 3.5 gm protein) Supplements: Pt refused   Total intake: 303 kcal (17% of minimum estimated needs)  8 gm protein (11% of minimum estimated needs)  INTERVENTION:  Continue Continuous tube feeding via post pyloric feeding tube: Nepro at 40 ml/hr (960 ml per day)    Provides 1700 kcal (94% of calorie needs), 78 gm protein (100% protein needs) , 698 ml free water  daily   Encourage PO intake House trays for now  Add Extra servings of soup on meal trays to increase intake  Ensure Plus High Protein po BID, each supplement provides 350 kcal and 20 grams of protein  *Nepro is not ideal with pt's with gastroparesis due to it's high fat and fiber content, especially if it is gastric. Rena-Vite daily  If pt cannot tolerate a FLD, recommend looking into a J-tube with  vented G-tube in the future.   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 - Still applicable    GOAL:    Patient will meet greater than or equal to 90% of their needs - Meeting with TF's   Estimated Nutritional Needs:  Kcal:  1800-2000 Protein:  75-90 grams Fluid:  1L + UOP  Olivia Kenning, RD Registered Dietitian  See Amion for more information

## 2024-03-17 NOTE — Plan of Care (Signed)
  Problem: Safety: Goal: Ability to remain free from injury will improve Outcome: Progressing   Problem: Skin Integrity: Goal: Risk for impaired skin integrity will decrease Outcome: Progressing   Problem: Metabolic: Goal: Ability to maintain appropriate glucose levels will improve Outcome: Progressing   

## 2024-03-17 NOTE — Progress Notes (Signed)
 [place]ien gave alarm Degas: forced to return blood. Exchange machine to #9. New set-up.

## 2024-03-17 NOTE — Progress Notes (Signed)
 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 Hypertensive in ED SBP 240s.  CT scan suggested colitis.  Patient's mentation continued to worsen. CT head was subsequently done which revealed intracranial hemorrhage.  Patient was admitted to the ICU.      Significant Hospital Events:  6/11 ED w n/v AMS. Abx IVF for colitis 6/12 Admit. AMS decr LOC. Incr O2 req. PCCM consult and code stroke called seen w rapid response nephro NP then neuro  6/13 Intubated with EVD, responding to commands  6/14 Extubated 6/17 acute L cerebellar infarct. Keppra  dc  6/18 Dialysis, fevering, EVD at 10 (129 mL last 24 hrs) 6/19 Narcotic dependence a driving factor. +Vasospasm on TCD. CTA with evolving Left cerebellar infarct, no HT or mass effect, substantially decreased caliber of multiple intracranial arteries and bilateral Circle-of-Willis branches (especially: Distal left V4, supraclinoid ICAs, MCA and ACA origins), appearance c/w widespread Vasospasm, Vasoconstriction. No associated large vessel occlusion. 6/21 CSF sampline from EVD -- WBC 244 RBC 19000, cx ngtd  6/25: diagnostic angio today with nsgy - negative 6/26 evd leaking when clamped  6/30 evd removed, HD overnight, HTN improved, 7 days cefazolin  complete 7/1 more lethargic> minimal response to narcan  x2, oxy stopped, EEG neg, MRI - new small left parietal cva, cortrak placed postpyloric, vagal episodes with N/V 7/2 iHD, vimpat  started 7/9 she appears agitated.  IVs were removed.  LP to be initiated to evaluate her opening pressure and to ensure that she does not have meningitis if this is still pending to be done. 7/10 LP done at bedside by Neurosurgery. Cortrak was removed yesterday evening and  replaced by Fluoro team today.  7/11 Cortrak obstructed and reomved and had to be replaced by Flouro. Calorie Count initiated. Undergoing Dialysis today  7/12: Having chest discomfort. Palliative Care Consulted for GOC Discussion   Assessment and Plan:  Intracranial hemorrhage with brain compression/subarachnoid hemorrhage/communicating hydrocephalus -Patient seen by neurology and neurosurgery.   -Underwent placement of right frontal pressure ventriculostomy via bur hole. -Ventriculostomy was removed on 6/30. -Seems to be stable from neurological standpoint except for headaches. Change Acteaminophen 650 mg q4h to q4hprn now and as needed Fioricet . Changed Morphine  to Hydromorphone  given ESRD.  Could add as needed oxycodone . -Underwent CT head the day before yesterday as ordered by neurosurgery.  This raised concern for a new infarct.  Subsequently MRI brain was done.  This showed small foci of hemorrhage in the posterior left cerebellum without evidence for acute infarct.  Punctate focus of acute infarct involving the medial left occipital cortex was seen.  Evolving subacute infarct in the left parietal lobe seen.  Trace blood products were noted. -Seen by neurosurgery and they are concerned about the hydrocephalus.  They have ordered LP to measure opening pressure and to ensure that she does not have meningitis with her prolonged intraventricular catheter..  IR was consulted to do this but the IR team advised that typically image guided LP is reserved for those who can and have failed attempts on the floor. Neurosurgery now doing LP bedside today and needle placed into the L3-4 interspace.  -The neurosurgical team feels that she would perhaps benefit from a ventriculoperitoneal shunt but want to rule out meningitis and measure her spinal  fluid pressure first and they were low and not indicative of Meningitis. NSGY does not believe she would benefit from a Shunt and recommending continuing supportive  efforts. -Palliative Care consulted for GOC discussion and still pending    Concern for seizure activity: Seen by Neurology.  Underwent EEG.  Patient was started on Lacosamide  100 mg po BID.  Improvement noted.   Vasospasm with left cerebellar infarct: Seen by Neurology.  Noted to be on aspirin . LDL 63.  HbA1c 4.2. Patient noted to be on midodrine  for BP augmentation but now due persistent hypotension midodrine  has been discontinued. PT and OT following.  SNF being pursued. -A small punctate focus of acute infarct was noted in the medial left occipital cortex.  Per neurosurgery there is nothing to be done for that.  Continue current management.  Will run this by neurology as well.   Essential Hypertension: Midodrine  was discontinued.  Blood pressure did drop with hemodialysis but now elevated.  Pain could be contributing to high readings.  She seems to have quite brittle blood pressure.  Would like to avoid antihypertensives as much as possible.  Continue as needed medications for now. CTM BP per Protocol. Last BP reading was 201/82   Chest Pain r/o ACS: Continues to have Chest Discomfort and Pain. Troponin Elevated mildly and went from 68 -> 63. EKG showed some T Wave Inversions. Give NTG. Check LTD Echo and showed Normal EF of 60-65% with no RWMA and has mild LVH. LVDP are indeterminate. Pain control as below  End-Stage Renal Disease on Hemodialysis/Hyperkalemia: History of failed transplant is present.  She is chronically immunosuppressed with CellCept , Cyclosporine , and Prednisone  BUN/Cr Trend: Recent Labs  Lab 03/11/24 0643 03/12/24 0512 03/13/24 0922 03/14/24 0545 03/15/24 0637 03/16/24 0652 03/17/24 0542  BUN 33* 45* 25* 35* 27* 41* 55*  CREATININE 4.36* 5.63* 4.13* 5.50* 3.84* 5.58* 6.96*  -Further management Management per nephrology. Patient is on sodium bicarbonate . K+ Trend is improving:  Recent Labs  Lab 03/11/24 0643 03/12/24 0512 03/13/24 0922 03/14/24 0545  03/15/24 0637 03/16/24 0652 03/17/24 0542  K 5.6* 5.8* 4.8 4.5 3.9 3.9 4.2  -Potassium level improved with hemodialysis. Nephrology changed her tube feedings to Nepro and dialyzed the patient 7/9 and she is underwent Dialysis on 7/11 with next planned HD 03/17/24 -Avoid Nephrotoxic Medications, Contrast Dyes, Hypotension and Dehydration to Ensure Adequate Renal Perfusion and will need to Renally Adjust Meds -Continue to Monitor and Trend Renal Function carefully and repeat CMP in the AM   Bradycardia: Secondary to vagal response. Resolved.  Abdominal Discomfort and Chest Discomfort: Adjusted Pain regimen to 0.5 mg Hydromorphone  q2hprn and then for Dialysis to q1hprn. CTM    Oropharyngeal Dysphagia: Noted to have Cortak feeding tube but was removed overnight.  Dietitian following.  Last seen by speech therapy on 7/2.  Patient's mentation has improved.  SLP evaluated and recommending FULL Liquid Diet.  The dietitian has recommended feeding tube to be placed postpyloric to avoid emesis  and this was done. If she can tolerate a full liquid diet will see how well her nutritional intake is. If continues to have N/V the dietitians are recommending discussing with her about with a venting G-tube in the future. I spoke her to about possible J- tube on 7/13.  Will see how she does with the full liquid diet for now and C/w Cortrak for now and see below   Colitis: Stable.  Nausea/Vomiting/Gastroparesis: Was going to Increase Metoclopramide  to 10 mg q8h however contraindicated  due to her ESRD. C/w Supportive care and c/w Antiemetics w. Ondansetron  IV 4 mg q6hprn and Compazine  PRN   Status post Pancreatic Transplant/Diabetes: C/w Sensitive Novology q4h. CTM CBGs.  CBGs ranging from 107-142   Anemia of Chronic Disease/ Normocytic Anemia: Relatively Stable. Hgb/Hct Trend:  Recent Labs  Lab 03/10/24 0222 03/12/24 9487 03/13/24 9077 03/14/24 0545 03/15/24 0637 03/16/24 0652 03/17/24 0542  HGB 10.1* 9.1*  10.1* 9.0* 8.6* 8.1* 8.4*  HCT 30.7* 27.6* 30.7* 27.6* 26.8* 24.7* 25.2*  MCV 97.2 95.8 96.8 98.2 99.3 97.2 96.2  -Getting Darbepoetin Alfa  60 mcg sq weekly -CTM for S/Sx of Bleeding; No overt bleeding noted. Repeat CBC in the AM    Moderate Protein Calorie Malnutrition / Underweight: Nutrition Status: Nutrition Problem: Moderate Malnutrition Etiology: chronic illness (ESRD on HD/DM) Signs/Symptoms: mild fat depletion, mild muscle depletion Interventions: Tube feeding Estimated body mass index is 18.74 kg/m as calculated from the following:   Height as of this encounter: 5' 7.5 (1.715 m).   Weight as of this encounter: 55.1 kg. -Calorie Count initiated and failed. Will need further discussion about the patient for a J-tube for feeds within the G-tube if she does not increase her intake or if she cannot tolerate oral food by mouth.  She had very poor medical insight into her current situation.  Palliative care has been consulted for further goals of care discussion  Hypoalbuminemia: Patient's Albumin Lvl ranging from 2.7-3.3; CTM and Trend and repeat CMP in the AM   DVT prophylaxis: heparin  injection 5,000 Units Start: 03/14/24 0600 SCDs Start: 02/14/24 0725    Code Status: Full Code Family Communication: No family present at bedside [place]  Disposition Plan:  Level of care: Progressive Status is: Inpatient Remains inpatient appropriate because:  Needs further clinical improvement and tolerance of diet   Consultants:  PCCM Transfer Nephrology Neurosurgery Neurology Interventional Radiology Palliative Care Medicine  Procedures:  As delineated as above  Antimicrobials:  Anti-infectives (From admission, onward)    Start     Dose/Rate Route Frequency Ordered Stop   02/27/24 1200  vancomycin  (VANCOREADY) IVPB 500 mg/100 mL  Status:  Discontinued        500 mg 100 mL/hr over 60 Minutes Intravenous Every M-W-F (Hemodialysis) 02/25/24 1129 02/25/24 1436    02/26/24 2200  ceFAZolin  (ANCEF ) IVPB 1 g/50 mL premix        1 g 100 mL/hr over 30 Minutes Intravenous Every 24 hours 02/26/24 1009 03/03/24 2140   02/25/24 1530  vancomycin  (VANCOREADY) IVPB 500 mg/100 mL        500 mg 100 mL/hr over 60 Minutes Intravenous Every M-W-F (Hemodialysis) 02/25/24 1436 02/25/24 1934   02/23/24 1800  meropenem  (MERREM ) 1 g in sodium chloride  0.9 % 100 mL IVPB  Status:  Discontinued        1 g 200 mL/hr over 30 Minutes Intravenous Every 24 hours 02/23/24 1625 02/26/24 1009   02/23/24 1800  vancomycin  (VANCOREADY) IVPB 1250 mg/250 mL        1,250 mg 166.7 mL/hr over 90 Minutes Intravenous  Once 02/23/24 1625 02/23/24 1933   02/17/24 2200  piperacillin -tazobactam (ZOSYN ) IVPB 2.25 g        2.25 g 100 mL/hr over 30 Minutes Intravenous Every 8 hours 02/17/24 1412 02/21/24 2336   02/14/24 1800  vancomycin  (VANCOREADY) IVPB 2000 mg/400 mL        2,000 mg 200 mL/hr over 120 Minutes Intravenous  Once 02/14/24 1709 02/14/24 2048  02/14/24 1622  piperacillin -tazobactam (ZOSYN ) IVPB 3.375 g  Status:  Discontinued        3.375 g 100 mL/hr over 30 Minutes Intravenous Every 6 hours 02/14/24 1622 02/17/24 1412   02/14/24 1545  piperacillin -tazobactam (ZOSYN ) IVPB 3.375 g  Status:  Discontinued        3.375 g 12.5 mL/hr over 240 Minutes Intravenous Every 8 hours 02/14/24 1451 02/14/24 1453   02/14/24 1545  piperacillin -tazobactam (ZOSYN ) IVPB 2.25 g  Status:  Discontinued        2.25 g 100 mL/hr over 30 Minutes Intravenous Every 8 hours 02/14/24 1453 02/14/24 1622   02/14/24 0800  cefTRIAXone  (ROCEPHIN ) 2 g in sodium chloride  0.9 % 100 mL IVPB  Status:  Discontinued        2 g 200 mL/hr over 30 Minutes Intravenous Every 24 hours 02/14/24 0730 02/14/24 1451   02/14/24 0800  metroNIDAZOLE  (FLAGYL ) IVPB 500 mg  Status:  Discontinued        500 mg 100 mL/hr over 60 Minutes Intravenous Every 12 hours 02/14/24 0730 02/14/24 1451       Subjective: Seen and examined at  bedside and she is resting asleep and did not wake up for the encounter. She remained in a deep sleep.  Per nursing no acute events overnight but still complaining of pain.  Still not eating very much and failed a calorie count.  Will need further goals of care discussion and have a discussion about a J-tube with a venting G-tube if he continues to not eat very much.  Objective: Vitals:   03/17/24 1921 03/17/24 1936 03/17/24 1951 03/17/24 2021  BP: (!) 117/100 (!) 188/73 (!) 201/82 (!) 201/75  Pulse: 95 96 (!) 101 (!) 103  Resp: 10 15 14 12   Temp: 98.2 F (36.8 C)     TempSrc: Oral     SpO2: 91% 96% 100% 97%  Weight:      Height:        Intake/Output Summary (Last 24 hours) at 03/17/2024 2036 Last data filed at 03/17/2024 0740 Gross per 24 hour  Intake 971.33 ml  Output 35 ml  Net 936.33 ml   Filed Weights   03/17/24 0500 03/17/24 1818 03/17/24 1825  Weight: 54 kg 55.1 kg 55.1 kg   Examination: Physical Exam:  Constitutional: Underweight thin chronically ill-appearing female who is asleep Respiratory: Diminished to auscultation bilaterally, no wheezing, rales, rhonchi or crackles. Normal respiratory effort and patient is not tachypenic. No accessory muscle use.  Unlabored breathing with equal chest rise Cardiovascular: RRR, has loud S1-S2. No extremity edema. Abdomen: Soft, Bowel sounds positive.  GU: Deferred. Musculoskeletal: No clubbing / cyanosis of digits/nails. No joint deformity upper and lower extremities. Neurologic: Asleep and did not wake up for the encounter Psychiatric: Somnolent and drowsy  Data Reviewed: I have personally reviewed following labs and imaging studies  CBC: Recent Labs  Lab 03/13/24 0922 03/14/24 0545 03/15/24 0637 03/16/24 0652 03/17/24 0542  WBC 9.8 8.4 6.6 4.2 4.6  NEUTROABS 7.8* 6.4 4.8 2.7 3.1  HGB 10.1* 9.0* 8.6* 8.1* 8.4*  HCT 30.7* 27.6* 26.8* 24.7* 25.2*  MCV 96.8 98.2 99.3 97.2 96.2  PLT 230 202 200 184 189   Basic Metabolic  Panel: Recent Labs  Lab 03/13/24 0922 03/14/24 0545 03/15/24 0637 03/16/24 0652 03/17/24 0542  NA 137 134* 136 135 135  K 4.8 4.5 3.9 3.9 4.2  CL 87* 86* 94* 90* 86*  CO2 32 29 34* 30 31  GLUCOSE 118*  107* 142* 117* 103*  BUN 25* 35* 27* 41* 55*  CREATININE 4.13* 5.50* 3.84* 5.58* 6.96*  CALCIUM  10.7* 10.4* 9.9 10.4* 10.2  MG 2.9* 3.0* 2.6* 3.1* 3.3*  PHOS 6.3* 6.6* 4.4 5.2* 5.3*   GFR: Estimated Creatinine Clearance: 9.2 mL/min (A) (by C-G formula based on SCr of 6.96 mg/dL (H)). Liver Function Tests: Recent Labs  Lab 03/13/24 0922 03/14/24 0545 03/15/24 0637 03/16/24 0652 03/17/24 0542  AST 21 25 17 19 17   ALT 12 12 12 13 11   ALKPHOS 98 106 96 104 114  BILITOT 0.6 0.6 0.5 0.6 0.6  PROT 7.1 7.0 6.4* 6.3* 6.3*  ALBUMIN 3.3* 3.2* 2.7* 2.9* 2.8*   No results for input(s): LIPASE, AMYLASE in the last 168 hours. No results for input(s): AMMONIA in the last 168 hours. Coagulation Profile: No results for input(s): INR, PROTIME in the last 168 hours. Cardiac Enzymes: No results for input(s): CKTOTAL, CKMB, CKMBINDEX, TROPONINI in the last 168 hours. BNP (last 3 results) No results for input(s): PROBNP in the last 8760 hours. HbA1C: No results for input(s): HGBA1C in the last 72 hours. CBG: Recent Labs  Lab 03/16/24 2326 03/17/24 0436 03/17/24 0818 03/17/24 1211 03/17/24 1640  GLUCAP 86 107* 107* 132* 142*   Lipid Profile: No results for input(s): CHOL, HDL, LDLCALC, TRIG, CHOLHDL, LDLDIRECT in the last 72 hours. Thyroid Function Tests: No results for input(s): TSH, T4TOTAL, FREET4, T3FREE, THYROIDAB in the last 72 hours. Anemia Panel: No results for input(s): VITAMINB12, FOLATE, FERRITIN, TIBC, IRON , RETICCTPCT in the last 72 hours. Sepsis Labs: No results for input(s): PROCALCITON, LATICACIDVEN in the last 168 hours.  Recent Results (from the past 240 hours)  CSF culture w Gram Stain     Status:  None   Collection Time: 03/13/24 10:40 AM   Specimen: Back; Cerebrospinal Fluid  Result Value Ref Range Status   Specimen Description BACK  Final   Special Requests NONE  Final   Gram Stain NO WBC SEEN NO ORGANISMS SEEN CYTOSPIN SMEAR   Final   Culture   Final    NO GROWTH 3 DAYS Performed at [facility], [address], [place], [name] [address]    Report Status 03/16/2024 FINAL  Final    Radiology Studies: ECHOCARDIOGRAM LIMITED Result Date: 03/16/2024    ECHOCARDIOGRAM LIMITED REPORT   Patient Name:   [name] [name] Date of Exam: 03/16/2024 Medical Rec #:  [id number]          Height:       67.5 in Accession #:    [id number]         Weight:       114.6 lb Date of Birth:  1981-07-15           BSA:          1.605 m Patient Age:    43 years           BP:           151/68 mmHg Patient Gender: F                  HR:           100 bpm. Exam Location:  Inpatient Procedure: Limited Echo, Limited Color Doppler and Cardiac Doppler (Both            Spectral and Color Flow Doppler were utilized during procedure). Indications:    Chest Pain  History:        Patient  has prior history of Echocardiogram examinations.                 Signs/Symptoms:Chest Pain.  Sonographer:    Vella Key Referring Phys: [id number] Enda Santo LATIF [facility] IMPRESSIONS  1. Left ventricular ejection fraction, by estimation, is 60 to 65%. The left ventricle has normal function. The left ventricle has no regional wall motion abnormalities. There is mild left ventricular hypertrophy. Left ventricular diastolic parameters are indeterminate.  2. Right ventricular systolic function is normal. The right ventricular size is normal.  3. A small pericardial effusion is present.  4. Trivial mitral valve regurgitation.  5. The aortic valve is tricuspid. Aortic valve regurgitation is not visualized. Aortic valve sclerosis/calcification is present, without any evidence of aortic stenosis.  6. The inferior vena cava is normal in size with greater  than 50% respiratory variability, suggesting right atrial pressure of 3 mmHg. FINDINGS  Left Ventricle: Left ventricular ejection fraction, by estimation, is 60 to 65%. The left ventricle has normal function. The left ventricle has no regional wall motion abnormalities. The left ventricular internal cavity size was normal in size. There is  mild left ventricular hypertrophy. Left ventricular diastolic parameters are indeterminate. Right Ventricle: The right ventricular size is normal. Right vetricular wall thickness was not assessed. Right ventricular systolic function is normal. Left Atrium: Left atrial size was normal in size. Right Atrium: Right atrial size was normal in size. Pericardium: A small pericardial effusion is present. Mitral Valve: There is mild thickening of the mitral valve leaflet(s). Mild to moderate mitral annular calcification. Trivial mitral valve regurgitation. Tricuspid Valve: The tricuspid valve is normal in structure. Aortic Valve: The aortic valve is tricuspid. Aortic valve regurgitation is not visualized. Aortic valve sclerosis/calcification is present, without any evidence of aortic stenosis. Pulmonic Valve: The pulmonic valve was normal in structure. Pulmonic valve regurgitation is not visualized. Aorta: The aortic root and ascending aorta are structurally normal, with no evidence of dilitation. Venous: The inferior vena cava is normal in size with greater than 50% respiratory variability, suggesting right atrial pressure of 3 mmHg. IAS/Shunts: No atrial level shunt detected by color flow Doppler. LEFT VENTRICLE PLAX 2D LVIDd:         3.80 cm LVIDs:         2.60 cm LV PW:         1.20 cm LV IVS:        1.20 cm LVOT diam:     1.60 cm LV SV:         45 LV SV Index:   28 LVOT Area:     2.01 cm  LV Volumes (MOD) LV vol d, MOD A2C: 83.7 ml LV vol d, MOD A4C: 98.2 ml LV vol s, MOD A2C: 28.2 ml LV vol s, MOD A4C: 35.2 ml LV SV MOD A2C:     55.5 ml LV SV MOD A4C:     98.2 ml LV SV MOD BP:       61.3 ml LEFT ATRIUM           Index LA diam:      3.30 cm 2.06 cm/m LA Vol (A4C): 46.6 ml 29.03 ml/m  AORTIC VALVE LVOT Vmax:   125.00 cm/s LVOT Vmean:  91.800 cm/s LVOT VTI:    0.225 m  AORTA Ao Root diam: 2.60 cm  SHUNTS Systemic VTI:  0.22 m Systemic Diam: 1.60 cm Vina Gull MD Electronically signed by Vina Gull MD Signature Date/Time: 03/16/2024/5:27:59 PM    Final  Scheduled Meds:  aspirin   81 mg Oral Daily   Chlorhexidine  Gluconate Cloth  6 each Topical Q0600   cycloSPORINE   200 mg Oral QHS   cycloSPORINE   225 mg Oral Daily   darbepoetin (ARANESP ) injection - DIALYSIS  60 mcg Subcutaneous Q Fri-1800   feeding supplement  237 mL Oral BID BM   feeding supplement (NEPRO CARB STEADY)  1,000 mL Per Tube Q24H   heparin  injection (subcutaneous)  5,000 Units Subcutaneous Q8H   insulin  aspart  0-9 Units Subcutaneous Q4H   lacosamide   100 mg Oral BID   metoCLOPramide   5 mg Oral TID AC   Or   metoCLOPramide   5 mg Per Tube TID AC   multivitamin  1 tablet Oral QHS   mycophenolate   500 mg Oral BID   predniSONE   15 mg Oral Q breakfast   sevelamer  carbonate  2.4 g Oral TID WC   sodium chloride  flush  3 mL Intravenous Q12H   Continuous Infusions:   LOS: 32 days   Alejandro Marker, DO [name] Available via Epic secure chat 7am-7pm After these hours, please refer to coverage provider listed on [email or web address] 03/17/2024, 8:36 PM

## 2024-03-17 NOTE — Progress Notes (Signed)
 Physical Therapy Treatment Patient Details Name: [name] [name] MRN: [id number] DOB: 03/14/1981 Today's Date: 03/17/2024   History of Present Illness 43 yo female who presented to the ED 6/11 with n/v, abd pain after HD. 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 CRRT 6/13- 6/15. Cerebral angiogram 6/25 shows no intracranial aneurysms, AVM, or fistula seen to explain the patient's subarachnoid hemorrhage, diffuse mild-moderate anterior circulation vasospasm. EVD 6/12-6/30.  on 7/1, MRI shows new small left parietal cva, cortrak placed postpyloric, vagal episodes with N/V. LP 7/10- negative. 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    PT Comments  Patient overall appeared weaker than expected. Had difficulty today sitting upright on EOB (tends to flex/lean forward with difficulty righting herself to vertical). Stood from EOB with +2 min progressing to mod assist, initially with RW and then +2 HHA. Attempted ambulation with pt unable to advance either foot due to weakness. Pt weepy, lethargic and wanted to return to bed, therefore did not attempt pivot to chair.     If plan is discharge home, recommend the following: Assistance with cooking/housework;Assistance with feeding;Direct supervision/assist for medications management;Direct supervision/assist for financial management;Assist for transportation;Help with stairs or ramp for entrance;Supervision due to cognitive status;A lot of help with bathing/dressing/bathroom;Two people to help with walking and/or transfers   Can travel by private vehicle     No  Equipment Recommendations  Wheelchair cushion (measurements PT);Wheelchair (measurements PT);Hospital bed;Hoyer lift    Recommendations for Other Services       Precautions / Restrictions Precautions Precautions: Fall Recall of Precautions/Restrictions:  Impaired Precaution/Restrictions Comments: cortrak, flexiseal Restrictions Weight Bearing Restrictions Per Provider Order: No     Mobility  Bed Mobility Overal bed mobility: Needs Assistance Bed Mobility: Sidelying to Sit, Sit to Sidelying   Sidelying to sit: Mod assist, HOB elevated     Sit to sidelying: Mod assist, HOB elevated General bed mobility comments: on Lt side on arrival; assist to move legs over EOB and to raise torso; on return to bed, assist to manage torso and pt lifted legs onto bed herself    Transfers Overall transfer level: Needs assistance Equipment used: 2 person hand held assist, Rolling walker (2 wheels) Transfers: Sit to/from Stand Sit to Stand: Min assist, +2 physical assistance, Mod assist           General transfer comment: Assist with power up to stand and steady. STSx4    Ambulation/Gait               General Gait Details: Attempted gait with pt unable to advance either LE (she felt too weak and could not offload her leg to advance it)   Stairs             Wheelchair Mobility     Tilt Bed    Modified Rankin (Stroke Patients Only) Modified Rankin (Stroke Patients Only) Pre-Morbid Rankin Score: No symptoms Modified Rankin: Moderately severe disability     Balance Overall balance assessment: Needs assistance Sitting-balance support: Feet supported, Bilateral upper extremity supported Sitting balance-Leahy Scale: Poor Sitting balance - Comments: pt with tendency to lean forward and collapse on herself Initial few times able to correct to upright with cues, then following required assist to remain upright   Standing balance support: Bilateral upper extremity supported Standing balance-Leahy Scale: Poor Standing balance comment: +2 steadying assist while standing  Communication Communication Communication: Impaired Factors Affecting Communication: Reduced clarity of speech   Cognition Arousal: Alert Behavior During Therapy: Flat affect   PT - Cognitive impairments: Attention, Awareness, Problem solving, Safety/Judgement                       PT - Cognition Comments: pt A&Ox3, to self, location, and situation. Pt can't recall date Following commands: Impaired Following commands impaired: Only follows one step commands consistently    Cueing Cueing Techniques: Verbal cues, Tactile cues, Gestural cues  Exercises Other Exercises Other Exercises: seated balance intervention: finding upright with truncal facilitation    General Comments        Pertinent Vitals/Pain Pain Assessment Pain Assessment: Faces Faces Pain Scale: Hurts even more Pain Location: abdomen and chest Pain Descriptors / Indicators: Moaning, Discomfort, Grimacing Pain Intervention(s): Limited activity within patient's tolerance, Monitored during session, Patient requesting pain meds-RN notified    Home Living                          Prior Function            PT Goals (current goals can now be found in the care plan section) Acute Rehab PT Goals Patient Stated Goal: to rest Time For Goal Achievement: 03/20/24 Potential to Achieve Goals: Good Progress towards PT goals: Not progressing toward goals - comment (?more lethargic than normal)    Frequency    Min 1X/week      PT Plan      Co-evaluation              AM-PAC PT 6 Clicks Mobility   Outcome Measure  Help needed turning from your back to your side while in a flat bed without using bedrails?: A Little Help needed moving from lying on your back to sitting on the side of a flat bed without using bedrails?: A Lot Help needed moving to and from a bed to a chair (including a wheelchair)?: Total Help needed standing up from a chair using your arms (e.g., wheelchair or bedside chair)?: Total Help needed to walk in hospital room?: Total Help needed climbing 3-5 steps with a railing? : Total 6  Click Score: 9    End of Session Equipment Utilized During Treatment: Gait belt Activity Tolerance: Patient limited by fatigue;Patient limited by lethargy Patient left: with call bell/phone within reach;in bed;with bed alarm set Nurse Communication: Mobility status;Patient requests pain meds PT Visit Diagnosis: Muscle weakness (generalized) (M62.81);Other symptoms and signs involving the nervous system (R29.898) Hemiplegia - Right/Left: Left Hemiplegia - dominant/non-dominant: Non-dominant Hemiplegia - caused by: Other Nontraumatic intracranial hemorrhage     Time: 8587-8567 PT Time Calculation (min) (ACUTE ONLY): 20 min  Charges:    $Therapeutic Activity: 8-22 mins PT General Charges $$ ACUTE PT VISIT: 1 Visit                      Macario RAMAN, PT Acute Rehabilitation Services  Office [phone]    Macario SHAUNNA Soja 03/17/2024, 3:50 PM

## 2024-03-17 NOTE — Progress Notes (Signed)
 [facility] Progress Note  Subjective:  Last HD on 7/11 with 1.4 kg UF. Note that palliative care was consulted for goals of care per charting.  Spoke with nursing and pain control has been a problem for her.     Review of systems:  Denies shortness of breath  Hx of chest pain per nursing Patient also with nausea/vomiting     Vitals:   03/16/24 2133 03/16/24 2326 03/17/24 0413 03/17/24 0500  BP: (!) 183/79 (!) 112/56 (!) 172/77   Pulse:  94    Resp: 14 20 14    Temp:  98.1 F (36.7 C) 98.4 F (36.9 C)   TempSrc:  Axillary Oral   SpO2:  95%    Weight:    54 kg  Height:        Physical Exam:   General adult female in bed, uncomfortable HEENT normocephalic atraumatic extraocular movements intact sclera anicteric Neck supple trachea midline Lungs clear to auscultation bilaterally normal work of breathing at rest; on room air Heart S1S2 no rub Abdomen soft nontender nondistended Extremities no edema  Psych normal mood and affect Access RUE AVF with bruit and thrill; aneurysmal region    OP HD:  MWF AF 3h  B400   57.2kg  2K   AVF  Heparin  none - Hectoral 4mcg IV q HD - Mircera 150mcg IV q 2 weeks (last given 6/9 -> due 6/23)   Assessment/Plan: Intracerebral hemorrhage: C/b hydrocephalus, 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 TCDs with evidence of vasospasm 02/11/24. Followed closely by neurosurgery, evd removed 6/30 CVA: New CVA found on 7/1 ESRD:  MWF HD schedule.  S/p CRRT 6/12-6/15, then iHD.  Per charting pain meds were ordered prn for during HD to try and keep her from signing off early.   Stop bicarb Hyperkalemia: resolved w/ TF changed to Nepro Volume: no vol excess on exam. Note she is below her EDW.  HTN: optimize volume with HD.  Given her chest pain, would consider imdur  if an agent is needed for BP   Anemia of ESRD: aranesp  60 mcg every Friday. Iron  adequate Secondary HPTH: Ca and Phos high, VDRA stopped 6/21, added sevelamer  via  tube. PTH 115.  Could consider low-dose Sensipar .  Continue to monitor for now because hypercalcemia likely a minimal problem at this point.  Pancreas/ kidney transplant (2014): for the functioning pancreas pt is getting her home dosing of CyA 225mg  qam and 200 mg qpm + Cellcept  and prednisone . CyA level 7/09 was 109 (goal 100-200) which is in range.     Disposition - per primary team    Recent Labs  Lab 03/16/24 0652 03/17/24 0542  HGB 8.1* 8.4*  ALBUMIN 2.9* 2.8*  CALCIUM  10.4* 10.2  PHOS 5.2* 5.3*  CREATININE 5.58* 6.96*  K 3.9 4.2   No results for input(s): IRON , TIBC, FERRITIN in the last 168 hours. Inpatient medications:  aspirin   81 mg Oral Daily   Chlorhexidine  Gluconate Cloth  6 each Topical Q0600   cycloSPORINE   200 mg Oral QHS   cycloSPORINE   225 mg Oral Daily   darbepoetin (ARANESP ) injection - DIALYSIS  60 mcg Subcutaneous Q Fri-1800   feeding supplement  237 mL Oral BID BM   feeding supplement (NEPRO CARB STEADY)  1,000 mL Per Tube Q24H   heparin  injection (subcutaneous)  5,000 Units Subcutaneous Q8H   insulin  aspart  0-9 Units Subcutaneous Q4H   lacosamide   100 mg Oral BID   metoCLOPramide   5 mg Oral  TID AC   Or   metoCLOPramide   5 mg Per Tube TID AC   multivitamin  1 tablet Oral QHS   mycophenolate   500 mg Oral BID   predniSONE   15 mg Oral Q breakfast   sevelamer  carbonate  2.4 g Oral TID WC   sodium bicarbonate   1,300 mg Oral TID   sodium chloride  flush  3 mL Intravenous Q12H     acetaminophen  **OR** acetaminophen , albuterol , artificial tears, atropine , butalbital -acetaminophen -caffeine , Gerhardt's butt cream, hydrALAZINE , HYDROmorphone  (DILAUDID ) injection, iohexol , labetalol , loperamide  HCl, naLOXone  (NARCAN )  injection, nitroGLYCERIN , ondansetron  (ZOFRAN ) IV, mouth rinse, prochlorperazine , simethicone     Katheryn JAYSON Saba, MD 7:32 AM 03/17/2024

## 2024-03-18 DIAGNOSIS — I160 Hypertensive urgency: Secondary | ICD-10-CM | POA: Diagnosis not present

## 2024-03-18 DIAGNOSIS — Z7189 Other specified counseling: Secondary | ICD-10-CM

## 2024-03-18 DIAGNOSIS — Z515 Encounter for palliative care: Secondary | ICD-10-CM

## 2024-03-18 DIAGNOSIS — K529 Noninfective gastroenteritis and colitis, unspecified: Secondary | ICD-10-CM | POA: Diagnosis not present

## 2024-03-18 DIAGNOSIS — Z4659 Encounter for fitting and adjustment of other gastrointestinal appliance and device: Secondary | ICD-10-CM

## 2024-03-18 DIAGNOSIS — Z8639 Personal history of other endocrine, nutritional and metabolic disease: Secondary | ICD-10-CM | POA: Diagnosis not present

## 2024-03-18 DIAGNOSIS — I609 Nontraumatic subarachnoid hemorrhage, unspecified: Secondary | ICD-10-CM | POA: Diagnosis not present

## 2024-03-18 DIAGNOSIS — R112 Nausea with vomiting, unspecified: Secondary | ICD-10-CM | POA: Diagnosis not present

## 2024-03-18 LAB — GLUCOSE, CAPILLARY
Glucose-Capillary: 103 mg/dL — ABNORMAL HIGH (ref 70–99)
Glucose-Capillary: 106 mg/dL — ABNORMAL HIGH (ref 70–99)
Glucose-Capillary: 110 mg/dL — ABNORMAL HIGH (ref 70–99)
Glucose-Capillary: 114 mg/dL — ABNORMAL HIGH (ref 70–99)
Glucose-Capillary: 119 mg/dL — ABNORMAL HIGH (ref 70–99)
Glucose-Capillary: 148 mg/dL — ABNORMAL HIGH (ref 70–99)
Glucose-Capillary: 91 mg/dL (ref 70–99)

## 2024-03-18 MED ORDER — DARBEPOETIN ALFA 100 MCG/0.5ML IJ SOSY
100.0000 ug | PREFILLED_SYRINGE | INTRAMUSCULAR | Status: DC
  Administered 2024-03-21 – 2024-03-28 (×2): 100 ug via SUBCUTANEOUS
  Filled 2024-03-18 (×2): qty 0.5

## 2024-03-18 MED ORDER — ISOSORBIDE MONONITRATE ER 30 MG PO TB24: 30.0000 mg | ORAL_TABLET | Freq: Every day | ORAL | Status: DC

## 2024-03-18 MED ORDER — AMLODIPINE BESYLATE 5 MG PO TABS
5.0000 mg | ORAL_TABLET | Freq: Every day | ORAL | Status: DC
  Administered 2024-03-18 – 2024-03-19 (×2): 5 mg via ORAL
  Filled 2024-03-18 (×3): qty 1

## 2024-03-18 MED ORDER — CHLORHEXIDINE GLUCONATE CLOTH 2 % EX PADS
6.0000 | MEDICATED_PAD | Freq: Every day | CUTANEOUS | Status: DC
  Administered 2024-03-22: 6 via TOPICAL

## 2024-03-18 NOTE — Progress Notes (Signed)
 [facility] Progress Note  Subjective:  Last HD on 7/14 with 2 kg UF.  She states that pain control has been a struggle.      Review of systems:    Denies shortness of breath  Hx of chest pain  She denies any n/v today - seems to be better   Vitals:   03/18/24 0327 03/18/24 0500 03/18/24 0807 03/18/24 1116  BP: (!) 149/63  (!) 190/83 (!) 177/75  Pulse: (!) 55  89 91  Resp: (!) 22  17 18   Temp: 99.1 F (37.3 C)   98.6 F (37 C)  TempSrc: Oral   Oral  SpO2: (!) 84%  90% 93%  Weight:  50.9 kg    Height:        Physical Exam:     General adult female in bed in NAD HEENT normocephalic atraumatic extraocular movements intact sclera anicteric Neck supple trachea midline Lungs clear to auscultation bilaterally normal work of breathing at rest; on room air Heart S1S2 no rub Abdomen soft nontender nondistended Extremities no edema  Psych normal mood and affect Access RUE AVF with bruit and thrill; aneurysmal region    OP HD:  MWF AF 3h  B400   57.2kg  2K   AVF  Heparin  none - Hectoral 4mcg IV q HD - Mircera 150mcg IV q 2 weeks (last given 6/9 -> due 6/23)   Assessment/Plan: Intracerebral hemorrhage: C/b hydrocephalus, 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 TCDs with evidence of vasospasm 02/11/24. Followed closely by neurosurgery, evd removed 6/30 CVA: New CVA found on 7/1 ESRD:  HD per MWF schedule.   S/p CRRT 6/12-6/15, then back on iHD.   Per charting, pain meds were ordered prn for during HD to try and keep her from signing off early.   Hyperkalemia: resolved w/ TF changed to Nepro Volume: no vol excess on exam. Note she is below her EDW.  HTN: optimize volume with HD. Start back amlodipine  at 5 mg daily    Anemia of ESRD: increase aranesp  to 100 mcg every Friday. Iron  adequate Secondary HPTH: Ca and Phos high, VDRA stopped 6/21, added sevelamer  via tube. Will update intact PTH. Could consider low-dose Sensipar .   Pancreas/ kidney transplant  (2014): for the functioning pancreas pt is getting her home dosing of CyA 225mg  qam and 200 mg qpm + Cellcept  and prednisone . CyA level 7/09 was 109 (goal 100-200) which is in range.     Disposition - per primary team    Recent Labs  Lab 03/16/24 0652 03/17/24 0542  HGB 8.1* 8.4*  ALBUMIN 2.9* 2.8*  CALCIUM  10.4* 10.2  PHOS 5.2* 5.3*  CREATININE 5.58* 6.96*  K 3.9 4.2   No results for input(s): IRON , TIBC, FERRITIN in the last 168 hours. Inpatient medications:  aspirin   81 mg Oral Daily   Chlorhexidine  Gluconate Cloth  6 each Topical Q0600   cycloSPORINE   200 mg Oral QHS   cycloSPORINE   225 mg Oral Daily   darbepoetin (ARANESP ) injection - DIALYSIS  60 mcg Subcutaneous Q Fri-1800   feeding supplement  237 mL Oral BID BM   feeding supplement (NEPRO CARB STEADY)  1,000 mL Per Tube Q24H   heparin  injection (subcutaneous)  5,000 Units Subcutaneous Q8H   insulin  aspart  0-9 Units Subcutaneous Q4H   lacosamide   100 mg Oral BID   metoCLOPramide   5 mg Oral TID AC   Or   metoCLOPramide   5 mg Per Tube TID AC   multivitamin  1 tablet Oral QHS   mycophenolate   500 mg Oral BID   predniSONE   15 mg Oral Q breakfast   sevelamer  carbonate  2.4 g Oral TID WC   sodium chloride  flush  3 mL Intravenous Q12H     acetaminophen  **OR** acetaminophen , albuterol , artificial tears, atropine , butalbital -acetaminophen -caffeine , Gerhardt's butt cream, hydrALAZINE , HYDROmorphone  (DILAUDID ) injection, iohexol , labetalol , loperamide  HCl, naLOXone  (NARCAN )  injection, nitroGLYCERIN , ondansetron  (ZOFRAN ) IV, mouth rinse, prochlorperazine , simethicone     [name] JAYSON Saba, MD 12:53 PM 03/18/2024

## 2024-03-18 NOTE — Progress Notes (Signed)
 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 Hypertensive in ED SBP 240s.  CT scan suggested colitis.  Patient's mentation continued to worsen. CT head was subsequently done which revealed intracranial hemorrhage.  Patient was admitted to the ICU.      Significant Hospital Events:  6/11 ED w n/v AMS. Abx IVF for colitis 6/12 Admit. AMS decr LOC. Incr O2 req. PCCM consult and code stroke called seen w rapid response nephro NP then neuro  6/13 Intubated with EVD, responding to commands  6/14 Extubated 6/17 acute L cerebellar infarct. Keppra  dc  6/18 Dialysis, fevering, EVD at 10 (129 mL last 24 hrs) 6/19 Narcotic dependence a driving factor. +Vasospasm on TCD. CTA with evolving Left cerebellar infarct, no HT or mass effect, substantially decreased caliber of multiple intracranial arteries and bilateral Circle-of-Willis branches (especially: Distal left V4, supraclinoid ICAs, MCA and ACA origins), appearance c/w widespread Vasospasm, Vasoconstriction. No associated large vessel occlusion. 6/21 CSF sampline from EVD -- WBC 244 RBC 19000, cx ngtd  6/25: diagnostic angio today with nsgy - negative 6/26 evd leaking when clamped  6/30 evd removed, HD overnight, HTN improved, 7 days cefazolin  complete 7/1 more lethargic> minimal response to narcan  x2, oxy stopped, EEG neg, MRI - new small left parietal cva, cortrak placed postpyloric, vagal episodes with N/V 7/2 iHD, vimpat  started 7/9 she appears agitated.  IVs were removed.  LP to be initiated to evaluate her opening pressure and to ensure that she does not have meningitis if this is still pending to be done. 7/10 LP done at bedside by Neurosurgery. Cortrak was removed yesterday evening and  replaced by Fluoro team today.  7/11 Cortrak obstructed and reomved and had to be replaced by Flouro. Calorie Count initiated. Undergoing Dialysis today  7/12: Having chest discomfort. Palliative Care Consulted for GOC Discussion 7/14: Agreeable to J-tube with venting G-tube. IR consulted. 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  Underwent placement of right frontal pressure ventriculostomy via bur hole and Ventriculostomy was removed on 6/30. -Seems to be stable from neurological standpoint except for headaches. Change Acteaminophen 650 mg q4h to q4hprn now and as needed Fioricet . Changed Morphine  to Hydromorphone  given ESRD.  Could add as needed oxycodone . -Underwent CT head which raised concern for a new infarct.  Subsequently MRI brain was done.  This showed small foci of hemorrhage in the posterior left cerebellum without evidence for acute infarct.  Punctate focus of acute infarct involving the medial left occipital cortex was seen.  Evolving subacute infarct in the left parietal lobe seen.  Trace blood products were noted. -Neurosurgery was concerned about the hydrocephalus and did LP and spinal fluid pressures were low and r/o'd Meningitis. NSGY does not believe she would benefit from a Shunt and recommending continuing supportive efforts. -Palliative Care consulted for GOC discussion given prolonged illness and poor medical insight.    Concern for seizure activity: Seen by Neurology and Underwent EEG.  Patient was started on Lacosamide  100 mg po BID.  Improvement noted. Seizure precautions    Vasospasm with left cerebellar infarct: Seen by Neurology.  Noted to be on aspirin . LDL 63.  HbA1c 4.2. Patient noted to be on midodrine  for BP augmentation but now due persistent hypotension midodrine  has been discontinued. PT  and OT following.  SNF being pursued when medically stable and issue with nutrition is addressed.  -A small punctate focus of acute infarct was noted in the medial left occipital cortex.  Per neurosurgery there is nothing to be done for that.  Continue current management.   Essential Hypertension: Midodrine  was discontinued.  Blood pressure did drop with hemodialysis but now elevated.  Pain could be contributing to high readings.  She seems to have quite brittle blood pressure.  Nephrology added back Amlodipine  5 mg po Daily..  Continue as needed medications for now and optimization w/ HD. CTM BP per Protocol. Last BP reading was 175/79   Chest Pain r/o ACS: Continues to have Chest Discomfort and Pain. Troponin Elevated mildly and went from 68 -> 63. EKG showed some T Wave Inversions. Give NTG. Check LTD Echo and showed Normal EF of 60-65% with no RWMA and has mild LVH. LVDP are indeterminate. Pain control as below  End-Stage Renal Disease on Hemodialysis/Hyperkalemia: History of failed transplant is present.  She is chronically immunosuppressed with CellCept , Cyclosporine , and Prednisone  Last BUN/Cr was 55/6.96.  -Further management Management per nephrology. Patient is on sodium bicarbonate .:  -Potassium level improved with hemodialysis and normalized. Nephrology changed her tube feedings to Nepro. C/w Dialysis per Nephro MWF. Avoid Nephrotoxic Medications, Contrast Dyes, Hypotension and Dehydration to Ensure Adequate Renal Perfusion and will need to Renally Adjust Meds. CTM and Trend Renal Function carefully and repeat CMP in the AM   Bradycardia: Secondary to vagal response. Resolved.  Abdominal Discomfort and Chest Discomfort: Adjusted Pain regimen to 0.5 mg Hydromorphone  q2hprn and then for Dialysis to q1hprn. CTM    Oropharyngeal Dysphagia: Patient's mentation has improved.  SLP evaluated and recommending FULL Liquid Diet.  The dietitian has recommended Cortrak feeding tube to be placed  postpyloric to avoid emesis and this was done. Failed her calorie count and continues  to have N/V; Dietitians are recommending J-tube w/venting G-tube and she seems to be agreeable. IR consulted. C/w Cortrak TF for now    Colitis: Stable.  Nausea/Vomiting/Gastroparesis: Was going to Increase Metoclopramide  to 10 mg q8h however contraindicated due to her ESRD. C/w Supportive care and c/w Antiemetics w. Ondansetron  IV 4 mg q6hprn and Compazine  PRN   Status post Pancreatic Transplant/Diabetes: C/w Sensitive Novology q4h. CTM CBGs.  CBGs ranging from 95-142   Anemia of Chronic Disease/ Normocytic Anemia: Relatively Stable. Hgb/Hct Trend dropped but appears stable now. Last Hgb/Hct was 8.4/25.2; Getting Darbepoetin Alfa  60 mcg sq weekly. CTM for S/Sx of Bleeding; No overt bleeding noted. Repeat CBC in the AM    Moderate Protein Calorie Malnutrition / Underweight: Nutrition Status: Nutrition Problem: Moderate Malnutrition Etiology: chronic illness (ESRD on HD/DM) Signs/Symptoms: mild fat depletion, mild muscle depletion Interventions: Tube feeding Estimated body mass index is 17.32 kg/m as calculated from the following:   Height as of this encounter: 5' 7.5 (1.715 m).   Weight as of this encounter: 50.9 kg. -Calorie Count failed. D/w patient aboit a J-tube for feeds within the G-tube if she does not increase her intake or if she cannot tolerate oral food by mouth. She is agreeable to this and IR has been consulted for placement. She had very poor medical insight into her current situation.  Palliative care has been consulted for further goals of care discussion  Hypoalbuminemia: Patient's Albumin Lvl ranging from 2.7-3.3; CTM  and Trend and repeat CMP in the AM   DVT prophylaxis: heparin  injection 5,000 Units Start: 03/14/24 0600 SCDs Start: 02/14/24 0725    Code Status: Full Code Family Communication: No family present @ bedside  Disposition Plan:  Level of care: Progressive Status is:  Inpatient Remains inpatient appropriate because: Needs further clinical improvement and either tolerance of diet or nutrition to be addressed. Consulting IR for J-tube w/ Venting G tube.  SNF is being pursued once cortrak is removed  Consultants:  PCCM Transfer Nephrology Neurosurgery Neurology Interventional Radiology Palliative Care Medicine  Procedures:  As delineated as above  Antimicrobials:  Anti-infectives (From admission, onward)    Start     Dose/Rate Route Frequency Ordered Stop   02/27/24 1200  vancomycin  (VANCOREADY) IVPB 500 mg/100 mL  Status:  Discontinued        500 mg 100 mL/hr over 60 Minutes Intravenous Every M-W-F (Hemodialysis) 02/25/24 1129 02/25/24 1436   02/26/24 2200  ceFAZolin  (ANCEF ) IVPB 1 g/50 mL premix        1 g 100 mL/hr over 30 Minutes Intravenous Every 24 hours 02/26/24 1009 03/03/24 2140   02/25/24 1530  vancomycin  (VANCOREADY) IVPB 500 mg/100 mL        500 mg 100 mL/hr over 60 Minutes Intravenous Every M-W-F (Hemodialysis) 02/25/24 1436 02/25/24 1934   02/23/24 1800  meropenem  (MERREM ) 1 g in sodium chloride  0.9 % 100 mL IVPB  Status:  Discontinued        1 g 200 mL/hr over 30 Minutes Intravenous Every 24 hours 02/23/24 1625 02/26/24 1009   02/23/24 1800  vancomycin  (VANCOREADY) IVPB 1250 mg/250 mL        1,250 mg 166.7 mL/hr over 90 Minutes Intravenous  Once 02/23/24 1625 02/23/24 1933   02/17/24 2200  piperacillin -tazobactam (ZOSYN ) IVPB 2.25 g        2.25 g 100 mL/hr over 30 Minutes Intravenous Every 8 hours 02/17/24 1412 02/21/24 2336   02/14/24 1800  vancomycin  (VANCOREADY) IVPB 2000 mg/400 mL        2,000 mg 200 mL/hr over 120 Minutes Intravenous  Once 02/14/24 1709 02/14/24 2048   02/14/24 1622  piperacillin -tazobactam (ZOSYN ) IVPB 3.375 g  Status:  Discontinued        3.375 g 100 mL/hr over 30 Minutes Intravenous Every 6 hours 02/14/24 1622 02/17/24 1412   02/14/24 1545  piperacillin -tazobactam (ZOSYN ) IVPB 3.375 g  Status:   Discontinued        3.375 g 12.5 mL/hr over 240 Minutes Intravenous Every 8 hours 02/14/24 1451 02/14/24 1453   02/14/24 1545  piperacillin -tazobactam (ZOSYN ) IVPB 2.25 g  Status:  Discontinued        2.25 g 100 mL/hr over 30 Minutes Intravenous Every 8 hours 02/14/24 1453 02/14/24 1622   02/14/24 0800  cefTRIAXone  (ROCEPHIN ) 2 g in sodium chloride  0.9 % 100 mL IVPB  Status:  Discontinued        2 g 200 mL/hr over 30 Minutes Intravenous Every 24 hours 02/14/24 0730 02/14/24 1451   02/14/24 0800  metroNIDAZOLE  (FLAGYL ) IVPB 500 mg  Status:  Discontinued        500 mg 100 mL/hr over 60 Minutes Intravenous Every 12 hours 02/14/24 0730 02/14/24 1451       Subjective: Seen and examined at bedside and still did not feel very well.  Agreeable to a J-tube with a venting G-tube.  Continues to complain of some abdominal and chest pain.  No other concerns or complaints at  this time.  Objective: Vitals:   03/18/24 0807 03/18/24 1116 03/18/24 1310 03/18/24 1525  BP: (!) 190/83 (!) 177/75 (!) 195/90 (!) 175/79  Pulse: 89 91  91  Resp: 17 18  16   Temp:  98.6 F (37 C)  98.3 F (36.8 C)  TempSrc:  Oral  Oral  SpO2: 90% 93%  97%  Weight:      Height:        Intake/Output Summary (Last 24 hours) at 03/18/2024 1841 Last data filed at 03/18/2024 9160 Gross per 24 hour  Intake 30 ml  Output 2135 ml  Net -2105 ml   Filed Weights   03/17/24 1818 03/17/24 1825 03/18/24 0500  Weight: 55.1 kg 55.1 kg 50.9 kg   Examination: Physical Exam:  Constitutional: Underweight thin chronically ill-appearing female who is awake and alert and complaining of some pain Respiratory: Diminished to auscultation bilaterally, no wheezing, rales, rhonchi or crackles. Normal respiratory effort and patient is not tachypenic. No accessory muscle use.  Unlabored breathing Cardiovascular: RRR, no murmurs / rubs / gallops. S1 and S2 auscultated. No extremity edema.  Abdomen: Soft, non-tender, non-distended. Bowel sounds  positive.  GU: Deferred. Musculoskeletal: No clubbing / cyanosis of digits/nails. No joint deformity upper and lower extremities.  Has a right upper extremity AV fistula Neurologic: She is awake and alert and interactive. Psychiatric: A Little anxious appearing  Data Reviewed: I have personally reviewed following labs and imaging studies  CBC: Recent Labs  Lab 03/13/24 0922 03/14/24 0545 03/15/24 0637 03/16/24 0652 03/17/24 0542  WBC 9.8 8.4 6.6 4.2 4.6  NEUTROABS 7.8* 6.4 4.8 2.7 3.1  HGB 10.1* 9.0* 8.6* 8.1* 8.4*  HCT 30.7* 27.6* 26.8* 24.7* 25.2*  MCV 96.8 98.2 99.3 97.2 96.2  PLT 230 202 200 184 189   Basic Metabolic Panel: Recent Labs  Lab 03/13/24 0922 03/14/24 0545 03/15/24 0637 03/16/24 0652 03/17/24 0542  NA 137 134* 136 135 135  K 4.8 4.5 3.9 3.9 4.2  CL 87* 86* 94* 90* 86*  CO2 32 29 34* 30 31  GLUCOSE 118* 107* 142* 117* 103*  BUN 25* 35* 27* 41* 55*  CREATININE 4.13* 5.50* 3.84* 5.58* 6.96*  CALCIUM  10.7* 10.4* 9.9 10.4* 10.2  MG 2.9* 3.0* 2.6* 3.1* 3.3*  PHOS 6.3* 6.6* 4.4 5.2* 5.3*   GFR: Estimated Creatinine Clearance: 8.5 mL/min (A) (by C-G formula based on SCr of 6.96 mg/dL (H)). Liver Function Tests: Recent Labs  Lab 03/13/24 0922 03/14/24 0545 03/15/24 0637 03/16/24 0652 03/17/24 0542  AST 21 25 17 19 17   ALT 12 12 12 13 11   ALKPHOS 98 106 96 104 114  BILITOT 0.6 0.6 0.5 0.6 0.6  PROT 7.1 7.0 6.4* 6.3* 6.3*  ALBUMIN 3.3* 3.2* 2.7* 2.9* 2.8*   No results for input(s): LIPASE, AMYLASE in the last 168 hours. No results for input(s): AMMONIA in the last 168 hours. Coagulation Profile: No results for input(s): INR, PROTIME in the last 168 hours. Cardiac Enzymes: No results for input(s): CKTOTAL, CKMB, CKMBINDEX, TROPONINI in the last 168 hours. BNP (last 3 results) No results for input(s): PROBNP in the last 8760 hours. HbA1C: No results for input(s): HGBA1C in the last 72 hours. CBG: Recent Labs  Lab  03/18/24 0038 03/18/24 0436 03/18/24 0806 03/18/24 1142 03/18/24 1526  GLUCAP 91 110* 119* 148* 106*   Lipid Profile: No results for input(s): CHOL, HDL, LDLCALC, TRIG, CHOLHDL, LDLDIRECT in the last 72 hours. Thyroid Function Tests: No results for input(s): TSH, T4TOTAL,  FREET4, T3FREE, THYROIDAB in the last 72 hours. Anemia Panel: No results for input(s): VITAMINB12, FOLATE, FERRITIN, TIBC, IRON , RETICCTPCT in the last 72 hours. Sepsis Labs: No results for input(s): PROCALCITON, LATICACIDVEN in the last 168 hours.  Recent Results (from the past 240 hours)  CSF culture w Gram Stain     Status: None   Collection Time: 03/13/24 10:40 AM   Specimen: Back; Cerebrospinal Fluid  Result Value Ref Range Status   Specimen Description BACK  Final   Special Requests NONE  Final   Gram Stain NO WBC SEEN NO ORGANISMS SEEN CYTOSPIN SMEAR   Final   Culture   Final    NO GROWTH 3 DAYS Performed at [facility], [address] [place], [name] [address]    Report Status 03/16/2024 FINAL  Final    Radiology Studies: No results found.  Scheduled Meds:  amLODipine   5 mg Oral Daily   aspirin   81 mg Oral Daily   Chlorhexidine  Gluconate Cloth  6 each Topical Q0600   cycloSPORINE   200 mg Oral QHS   cycloSPORINE   225 mg Oral Daily   [START ON 03/21/2024] darbepoetin (ARANESP ) injection - DIALYSIS  100 mcg Subcutaneous Q Fri-1800   feeding supplement  237 mL Oral BID BM   feeding supplement (NEPRO CARB STEADY)  1,000 mL Per Tube Q24H   heparin  injection (subcutaneous)  5,000 Units Subcutaneous Q8H   insulin  aspart  0-9 Units Subcutaneous Q4H   lacosamide   100 mg Oral BID   metoCLOPramide   5 mg Oral TID AC   Or   metoCLOPramide   5 mg Per Tube TID AC   multivitamin  1 tablet Oral QHS   mycophenolate   500 mg Oral BID   predniSONE   15 mg Oral Q breakfast   sevelamer  carbonate  2.4 g Oral TID WC   sodium chloride  flush  3 mL Intravenous Q12H    Continuous Infusions:   LOS: 33 days   Alejandro Marker, DO [name] Available via Epic secure chat 7am-7pm After these hours, please refer to coverage provider listed on [email or web address] 03/18/2024, 6:41 PM

## 2024-03-18 NOTE — Progress Notes (Signed)
 Occupational Therapy Treatment Patient Details Name: [name] [name] MRN: [id number] DOB: [birth year] Today's Date: 03/18/2024   History of present illness 43 yo female who presented to the ED 6/11 with n/v, abd pain after HD. 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 CRRT 6/13- 6/15. Cerebral angiogram 6/25 shows no intracranial aneurysms, AVM, or fistula seen to explain the patient's subarachnoid hemorrhage, diffuse mild-moderate anterior circulation vasospasm. EVD 6/12-6/30.  on 7/1, MRI shows new small left parietal cva, cortrak placed postpyloric, vagal episodes with N/V. LP 7/10- negative. 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   OT comments  Pt is making slow progress towards their acute OT goals. Pt was agreeable to get OOB on arrival, she had c/o pain but no due for medication per RN. Overall she needed mod A for bed mobility and had poor sitting balance with R lateral lean. She tolerated 1 STS and SP transfer with min A and cues for initiation and sequencing. Pt demonstrated improved initiation and sequencing of basic grooming task. OT to continue to follow acutely to facilitate progress towards established goals. Pt will continue to benefit from skilled inpatient follow up therapy, <3 hours/day.       If plan is discharge home, recommend the following:  A lot of help with walking and/or transfers;Two people to help with walking and/or transfers;A lot of help with bathing/dressing/bathroom;Two people to help with bathing/dressing/bathroom;Assistance with cooking/housework;Assistance with feeding;Direct supervision/assist for medications management;Direct supervision/assist for financial management;Assist for transportation;Supervision due to cognitive status;Help with stairs or ramp for entrance   Equipment Recommendations  None recommended by OT    Recommendations for Other  Services PT consult    Precautions / Restrictions Precautions Precautions: Fall Recall of Precautions/Restrictions: Impaired Precaution/Restrictions Comments: cortrak, flexiseal Restrictions Weight Bearing Restrictions Per Provider Order: No       Mobility Bed Mobility Overal bed mobility: Needs Assistance Bed Mobility: Supine to Sit     Supine to sit: Mod assist     General bed mobility comments: mod to bring hips forward and elevate trunk    Transfers Overall transfer level: Needs assistance Equipment used: 1 person hand held assist Transfers: Sit to/from Stand, Bed to chair/wheelchair/BSC Sit to Stand: Min assist Stand pivot transfers: Min assist         General transfer comment: pt declined further STS once in the chair due to pain and being hot     Balance Overall balance assessment: Needs assistance Sitting-balance support: Feet supported, Bilateral upper extremity supported Sitting balance-Leahy Scale: Poor Sitting balance - Comments: CGA-mod A for sitting balance   Standing balance support: Bilateral upper extremity supported, During functional activity Standing balance-Leahy Scale: Poor Standing balance comment: only tolerated standing for ~10 seconds                           ADL either performed or assessed with clinical judgement   ADL Overall ADL's : Needs assistance/impaired     Grooming: Set up;Sitting Grooming Details (indicate cue type and reason): face grooming                 Toilet Transfer: Minimal assistance;Stand-pivot Toilet Transfer Details (indicate cue type and reason): simulated from bed>chair. Pt with great power up to standing, needed min cues and assist to turn feet to sit in chair         Functional mobility during ADLs: Minimal assistance General  ADL Comments: limited by weakness, balance, pain and cognition. Pt required maximal cues for all tasks    Extremity/Trunk Assessment Upper Extremity  Assessment Upper Extremity Assessment: RUE deficits/detail;LUE deficits/detail RUE Deficits / Details: very weak but using RUE for grooming, bed mobility and transfer LUE Deficits / Details: limited use of LUE, requires cues for use   Lower Extremity Assessment Lower Extremity Assessment: Defer to PT evaluation        Vision   Vision Assessment?: Vision impaired- to be further tested in functional context Eye Alignment: Impaired (comment) Ocular Range of Motion: Impaired-to be further tested in functional context Tracking/Visual Pursuits: Decreased smoothness of horizontal tracking;Decreased smoothness of vertical tracking Saccades: Decreased speed of saccadic movement;Additional eye shifts occurred during testing;Impaired - to be further tested in functional context Convergence: Impaired (comment) Visual Fields: Impaired-to be further tested in functional context Additional Comments: denied double vision. dysconjugate gaze still present. L eye tracks appripriately, R eye wtih jerky/delayed movements   Perception Perception Perception: Impaired Preception Impairment Details: Inattention/Neglect   Praxis Praxis Praxis: Impaired Praxis Impairment Details: Organization   Communication Communication Communication: Impaired Factors Affecting Communication: Reduced clarity of speech   Cognition Arousal: Alert Behavior During Therapy: Flat affect Cognition: Cognition impaired Difficult to assess due to: Level of arousal, Impaired communication Orientation impairments: Time, Situation Awareness: Intellectual awareness impaired, Online awareness impaired Memory impairment (select all impairments): Short-term memory Attention impairment (select first level of impairment): Sustained attention Executive functioning impairment (select all impairments): Initiation OT - Cognition Comments: better initiation to functional tasks like bed mobility, face grooming, sitting balance and  transfers. Limited insight to situation and time, perseverating on pain despite RN informing pt she is not due for medication until 11:30                 Following commands: Impaired Following commands impaired: Only follows one step commands consistently      Cueing   Cueing Techniques: Verbal cues, Gestural cues        General Comments VSS    Pertinent Vitals/ Pain       Pain Assessment Pain Assessment: Faces Faces Pain Scale: Hurts little more Pain Location: chest Pain Descriptors / Indicators: Moaning, Discomfort, Grimacing Pain Intervention(s): Monitored during session, Limited activity within patient's tolerance   Frequency  Min 2X/week        Progress Toward Goals  OT Goals(current goals can now be found in the care plan section)  Progress towards OT goals: Progressing toward goals  Acute Rehab OT Goals Patient Stated Goal: less pain OT Goal Formulation: With patient Time For Goal Achievement: 04/01/24 Potential to Achieve Goals: Fair ADL Goals Pt Will Perform Grooming: with supervision;sitting Pt Will Perform Upper Body Dressing: with min assist;sitting Pt Will Perform Lower Body Dressing: with min assist;sitting/lateral leans;sit to/from stand Pt Will Transfer to Toilet: with min assist;stand pivot transfer;bedside commode Additional ADL Goal #1: pt will follow 1 step commands 50% of the time to complete a functional task   AM-PAC OT 6 Clicks Daily Activity     Outcome Measure   Help from another person eating meals?: A Little Help from another person taking care of personal grooming?: A Little Help from another person toileting, which includes using toliet, bedpan, or urinal?: A Lot Help from another person bathing (including washing, rinsing, drying)?: A Lot Help from another person to put on and taking off regular upper body clothing?: A Lot Help from another person to put on and taking off regular lower  body clothing?: A Lot 6 Click Score:  14    End of Session Equipment Utilized During Treatment: Gait belt  OT Visit Diagnosis: Unsteadiness on feet (R26.81);Other abnormalities of gait and mobility (R26.89);Muscle weakness (generalized) (M62.81);Hemiplegia and hemiparesis Hemiplegia - Right/Left: Right   Activity Tolerance Patient limited by pain   Patient Left in chair;with call bell/phone within reach;with chair alarm set   Nurse Communication Mobility status        Time: 1013-1030 OT Time Calculation (min): 17 min  Charges: OT General Charges $OT Visit: 1 Visit OT Treatments $Therapeutic Activity: 8-22 mins  Lucie Kendall, OTR/L Acute Rehabilitation Services Office [phone] Secure Chat Communication Preferred   Lucie JONETTA Kendall 03/18/2024, 10:42 AM

## 2024-03-18 NOTE — Progress Notes (Signed)
   03/18/24 1550  Spiritual Encounters  Type of Visit Attempt (pt unavailable)   Chaplain visited to discuss Advance Directive,however Pt.was sleeping will follow up tomorrow morning.

## 2024-03-18 NOTE — Consult Note (Signed)
 Palliative Care Consult Note                                  Date: 03/18/2024   Patient Name: [name] [name]  DOB: [birth year]  MRN: [id number]  Age / Sex: 43 y.o., female  [facility]P: [name] Referring Physician: Sherrill Alejandro Donovan, DO  Reason for Consultation: Establishing goals of care  Past Medical History:  Diagnosis Date   Anemia    Anxiety    DM (diabetes mellitus), type 1 (HCC)    ESRD on hemodialysis (HCC)    Gastroparesis    History of simultaneous kidney and pancreas transplant (HCC) 2014   Hypertension    Ischemic cerebrovascular accident (CVA) (HCC) 11/2021   Narcotic abuse (HCC)    Non compliance with medical treatment    Stroke [facility])    Vision loss     Subjective:   This NP Camellia Kays reviewed medical records, received report from team, assessed the patient and then meet at the patient's bedside to discuss diagnosis, prognosis, GOC, EOL wishes disposition and options.  Before meeting with the patient/family, I spent time reviewing the chart including Multiple notes throughout the past 33 days admission.  Most closely reviewed nephrology note from today, TOC note from today, OT note from today, internal medicine/hospitalist note from yesterday.  Essentially the patient has had a 33-day, very complicated admission.  She has a very complex history as outlined in the patient profile below.  She came in with ICH with brain compression, subarachnoid hemorrhage, communicating hydrocephalus status post ventriculostomy tube which has since been removed on 630.  She also had vasospasm with a cerebellar infarct, significant hypertension, dysphagia with moderate malnutrition and persistent nausea and vomiting now considering possible venting G-tube/J-tube.  Reviewed multiple results including labs from yesterday  CBC which shows no leukocytosis, low hemoglobin at 8.4 although this remains stable.  CMP from yesterday  shows stable electrolytes including normal sodium and potassium, creatinine elevated at 6.96 which is consistent with end-stage renal disease status post failed kidney transplant now on hemodialysis.  Albumin remains significantly low at 2.8 today consistent with diagnosis of moderate malnutrition.  Magnesium  and phosphorus are also elevated at 5.3 and 3.3 respectively (both yesterday).  I met with the patient at the bedside, no family was present.   We meet to discuss diagnosis prognosis, GOC, EOL wishes, disposition and options. Concept of Palliative Care was introduced as specialized medical care for people and their families living with serious illness.  If focuses on providing relief from the symptoms and stress of a serious illness.  The goal is to improve quality of life for both the patient and the family. Values and goals of care important to patient and family were attempted to be elicited.  Created space and opportunity for patient  and family to explore thoughts and feelings regarding current medical situation   Natural trajectory and current clinical status were discussed. Questions and concerns addressed. Patient  encouraged to call with questions or concerns.    Patient/Family Understanding of Illness: She initially tells me she cannot tell me what is going on with her health because it is so much to tell.  I encouraged her to start with single things and we stepped through her complex medical history.  She knows that her kidneys have failed and she is on dialysis, had a kidney transplant that failed.  Sometimes she has high  blood pressure as an outpatient and at baseline she is on medications for this.  Her nausea and vomiting (query secondary to gastroparesis) is new and she is having a lot of vomiting.  Sometimes the nausea and vomiting is out of nowhere, sometimes after trying to eat.  She is also having significant pain.  We spent a substantial amount of time discussing her details of  her clinical picture including the brain bleed, subsequent neurological effects, need for therapy, ongoing hospital therapy, refractory nausea/vomiting and consideration of venting G-tube and J-tube.  Life Review: The patient is originally from [place], all of her family remains in [place].  She lives alone, cannot remember the exact year that she came to this country.  She has friends locally including 2 friends and into her chart Nakema and Ole.  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  In her free time she enjoys walking including walking her dog which is a Pension scheme manager.  Baseline Status: At baseline the patient lives alone, has local friends but family lives internationally.  She is generally independent in all her IADLs.  She is chronically on outpatient hemodialysis and usually does okay with this, tolerates well.  At baseline she also typically eats well, no significant outpatient problems with persistent nausea/vomiting or gastroparesis.  Overall she describes a good quality of life, although she has not felt well since coming into the hospital.  Goals: To get back to baseline quality of life for as close as possible.  Today's Discussion: In addition to discussions described above we had extensive discussion on various topics.  We spent a significant amount of time talking about multiple details about her clinical picture.  We discussed in depth the ICH and vasospasm with cerebellar infarct.  At 1 point she asked why her speech is so different than normal.  I stated that it is likely from ramifications of her neurological injuries.  We discussed ongoing rehab including SLP, PT, OT to attempt to get her stronger and clean this close as possible to baseline.  I was clear in open in our discussions that it is likely going to be a long road and entail some amount of rehab at a rehab facility.  She confirmed that she is okay with this.  We also did a substantial amount of advance care planning  including discussing CODE STATUS, options for artificial nutrition, exploring prognosis and likely difficult road ahead.  This is more fully detailed below in advance care planning section.  We discussed the prospect of apparent venting G-tube with J-tube extension for nutrition and to help with nausea and vomiting.  She is able to tell me that he discussed this this morning that the tube would allow them to provide nutrition and they would put her to sleep for her.  After discussing this she states she is in agreement with having this tube placed.  I discussed that it would be possible that she would need this tube for long-term nutrition and she is also okay with this.  Finally, we discussed CODE STATUS.  At the end of our discussion she is wanting to remain a full code.  This is not surprising given her young age and previous good quality of life.  We discussed completion of advance directives to name and HCPOA in the event that she becomes unable to express her medical decision wishes clearly.  She states that she would want her best friend Ole to be her HCPOA.  I offered spiritual care  support to complete HCPOA documentation and she is in agreement.  I shared that I would follow-up in a couple days to check on her progress. I provided emotional and general support through therapeutic listening, empathy, sharing of stories, therapeutic touch, and other techniques. I answered all questions and addressed all concerns to the best of my ability.  Review of Systems  Constitutional:  Positive for appetite change.       Notes generalized pain  Respiratory:  Negative for shortness of breath.   Cardiovascular:  Positive for chest pain (chronic).  Gastrointestinal:  Positive for nausea and vomiting. Negative for abdominal pain.    Objective:   Primary Diagnoses: Present on Admission:  (Resolved) Intractable nausea and vomiting  Colitis  Acute respiratory failure with hypoxia (HCC)   Hyperkalemia  Acute metabolic encephalopathy  Hypertensive urgency  Prolonged QT interval  Pancytopenia (HCC)  Controlled type 2 diabetes mellitus with complication, without long-term current use of insulin  (HCC)  Abnormal CT of the abdomen  Sepsis (HCC)   Vital Signs:  BP (!) 177/75 (BP Location: Left Arm)   Pulse 91   Temp 98.6 F (37 C) (Oral)   Resp 18   Ht 5' 7.5 (1.715 m)   Wt 50.9 kg   LMP 02/13/2024 (Exact Date)   SpO2 93%   BMI 17.32 kg/m   Physical Exam Vitals and nursing note reviewed.  Constitutional:      General: She is not in acute distress (During my visit she does appear in pain).    Appearance: She is ill-appearing. She is not toxic-appearing.  HENT:     Head: Normocephalic and atraumatic.  Cardiovascular:     Rate and Rhythm: Normal rate.  Pulmonary:     Effort: Pulmonary effort is normal. No respiratory distress.  Abdominal:     General: Abdomen is flat. There is no distension.  Skin:    General: Skin is warm and dry.  Neurological:     General: No focal deficit present.     Mental Status: She is alert and oriented to person, place, and time.  Psychiatric:        Mood and Affect: Mood normal.        Behavior: Behavior normal.     Palliative Assessment/Data: 40%   Advanced Care Planning:   Existing Vynca/ACP Documentation: None  Primary Decision Maker: PATIENT  Pertinent diagnosis: Intracranial hemorrhage, communicating hydrocephalus, subarachnoid hemorrhage, vasospasm with cerebellar infarct, dysphagia, moderate malnutrition, end-stage renal disease status post failed kidney transplant  The patient and/or family consented to a voluntary Advance Care Planning Conversation in person. Individuals present for the conversation: This NP, the patient  Summary of the conversation: We discussed details of her clinical picture including chronic health problems and acute presentation, we discussed poor prognosis, CODE STATUS, options for  artificial nutrition given recalcitrant nausea/vomiting, overall goals.  Outcome of the conversations and/or documents completed: Patient wishes to remain a full code, is open to G-tube/J-tube for nutrition and treatment, is open to rehab to attempt to get back to baseline  I spent 30 minutes providing separately identifiable ACP services with the patient and/or surrogate decision maker in a voluntary, in-person conversation discussing the patient's wishes and goals as detailed in the above note.  Assessment & Plan:   HPI/Patient Profile: 43 y.o. 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  She was admitted on 02/13/2024 with possible sepsis secondary to colitis, abdominal pain, nausea, vomiting, history of gastroparesis, acute respiratory failure with hypoxia secondary to volume overload history of pancreatic/renal transplant in 2015, ESRD on HD, acute metabolic encephalopathy, hypertensive urgency, and others.   She has had a very complicated admission as outlined below:  Significant Hospital Events:  6/11 ED w n/v AMS. Abx IVF for colitis 6/12 Admit. AMS decr LOC. Incr O2 req. PCCM consult and code stroke called seen w rapid response nephro NP then neuro  6/13 Intubated with EVD, responding to commands  6/14 Extubated 6/17 acute L cerebellar infarct. Keppra  dc  6/18 Dialysis, fevering, EVD at 10 (129 mL last 24 hrs) 6/19 Narcotic dependence a driving factor. +Vasospasm on TCD. CTA with evolving Left cerebellar infarct, no HT or mass effect, substantially decreased caliber of multiple intracranial arteries and bilateral Circle-of-Willis branches (especially: Distal left V4, supraclinoid ICAs, MCA and ACA origins), appearance c/w widespread Vasospasm, Vasoconstriction. No associated large vessel occlusion. 6/21 CSF sampline from EVD -- WBC 244 RBC 19000, cx  ngtd  6/25: diagnostic angio today with nsgy - negative 6/26 evd leaking when clamped  6/30 evd removed, HD overnight, HTN improved, 7 days cefazolin  complete 7/1 more lethargic> minimal response to narcan  x2, oxy stopped, EEG neg, MRI - new small left parietal cva, cortrak placed postpyloric, vagal episodes with N/V 7/2 iHD, vimpat  started 7/9 she appears agitated.  IVs were removed.  LP to be initiated to evaluate her opening pressure and to ensure that she does not have meningitis if this is still pending to be done. 7/10 LP done at bedside by Neurosurgery. Cortrak was removed yesterday evening and replaced by Fluoro team today.  7/11 Cortrak obstructed and reomved and had to be replaced by Flouro. Calorie Count initiated. Undergoing Dialysis today  7/12: Having chest discomfort. Palliative Care Consulted for GOC Discussion  Palliative medicine was consulted for GOC conversations.  SUMMARY OF RECOMMENDATIONS   Full code Full scope of care Open to G-tube/J-tube placement, understands poor prognosis and prolonged path to recovery Agreeable to completing advanced directive/HCPOA Spiritual care consult for advance directive completion Palliative medicine will follow-up in a couple days Please notify us  of any significant clinical change or new palliative needs before then  Symptom Management:  Per primary team PMT is available to assist as needed  Code Status: Full code  Prognosis:  Unable to determine  Discharge Planning:  To Be Determined   Discussed with: Patient, medical team, bedside nursing    Thank you for allowing us  to participate in the care of Saharra Naseer Dilmore PMT will continue to support holistically.  Billing based on MDM: High  Problems Addressed: One or more chronic illnesses with severe exacerbation, progression, or side effects of treatment.  Amount and/or Complexity of Data: Category 1:Review of prior external note(s) from each unique source, Review of  the result(s) of each unique test, and Assessment requiring an independent historian(s) and Category 3:Discussion of management or test interpretation with external physician/other qualified health care professional/appropriate source (not separately reported) (Patient with memory issues, required details about the past 24 to 48 hours for bedside nursing)  Risks: N/A  Detailed review of medical records (labs, imaging, vital signs), medically appropriate exam, discussed with treatment team, counseling and education to patient, family, & staff, documenting clinical information, medication management, coordination of care  Signed by: Camellia Kays, NP Palliative Medicine Team  Team Phone # [phone] (Nights/Weekends)  03/18/2024, 1:09 PM

## 2024-03-18 NOTE — Progress Notes (Signed)
 Speech Language Pathology Treatment: Dysphagia  Patient Details Name: [name] [name] MRN: [id number] DOB: 05-Mar-1981 Today's Date: 03/18/2024 Time: 1645-1700 SLP Time Calculation (min) (ACUTE ONLY): 15 min  Assessment / Plan / Recommendation Clinical Impression  Patient seen by SLP for skilled treatment focused on dysphagia goals. Patient was awake and alert, asking RN who was in the room for pain medication. She proceeded to perseverate on wanting her Dilaudid  and asked SLP to check the computer because I'm sure she made a mistake (in regards to RN telling her when next dose is due). She was receptive to having some of the cream of chicken soup that had just arrived on her meal tray. SLP fed her spoon bites of soup and patient able to manage with minimal assistance to clear anterior spillage on her lips and chin. No overt s/s aspiration but after approximately 6 spoonfuls of soup, patient started to gag and cough but no vomiting occurred and patient soon recovered from this. She commented, it happens every time. She then requested medicine for nausea. SLP to continue to follow.   HPI HPI: Patient is a 43 y.o. female with PMH: ESRD on HD, s/p renal and pancreatic transplant, DM-1 with gastroparesis, anxiety, narcotic abuse, vision loss, non-compliance with medical treatment. She presented to the hospital on 02/14/24 with nausea and vomiting. She was admitted with possible sepsis secondary to colitis, acute respiratory failure with hypoxia, acute metabolic encephalopathy. 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 CRRT 6/13-15, EVD placed on 6/12; removed 6/30. She was emergently intubated on 6/12 and extubated on 6/14.      SLP Plan  Continue with current plan of care          Recommendations  Diet recommendations: Thin liquid (full liquids) Liquids provided via: Teaspoon;Cup;Straw Medication Administration: Whole meds with liquid Supervision: Staff to assist with  self feeding;Full supervision/cueing for compensatory strategies Compensations: Minimize environmental distractions;Slow rate;Small sips/bites Postural Changes and/or Swallow Maneuvers: Seated upright 90 degrees;Upright 30-60 min after meal                  Oral care QID   Frequent or constant Supervision/Assistance Dysphagia, unspecified (R13.10)     Continue with current plan of care     Norleen IVAR Blase, MA, CCC-SLP Speech Therapy

## 2024-03-18 NOTE — TOC Progression Note (Signed)
 Transition of Care [facility]) - Progression Note    Patient Details  Name: [name] [name] MRN: [id number] Date of Birth: May 05, 1981  Transition of Care [facility]) CM/SW Contact  Montie LOISE Louder, [name] Phone Number: 03/18/2024, 12:00 PM  Clinical Narrative:     TOC continuing to follow- recommendation is for SNF. Patient remains w/ cortrak - TOC will pursue SNF placement once cortrak is removed.  Montie Louder, MSW, LCSW Clinical Social Worker    Expected Discharge Plan: Skilled Nursing Facility Barriers to Discharge: Continued Medical Work up, English as a second language teacher, SNF Pending bed offer  Expected Discharge Plan and Services                                               Social Determinants of Health (SDOH) Interventions SDOH Screenings   Food Insecurity: Patient Unable To Answer (02/15/2024)  Housing: Low Risk  (03/05/2024)  Transportation Needs: Patient Unable To Answer (02/15/2024)  Utilities: Patient Unable To Answer (02/15/2024)  Social Connections: Unknown (10/04/2022)   Received from [facility]  Tobacco Use: Medium Risk (02/13/2024)    Readmission Risk Interventions    11/17/2021   12:15 PM  Readmission Risk Prevention Plan  Transportation Screening Complete  Medication Review (RN Care Manager) Complete  PCP or Specialist appointment within 3-5 days of discharge Complete  HRI or Home Care Consult Complete  SW Recovery Care/Counseling Consult Complete  Palliative Care Screening Not Applicable  Skilled Nursing Facility Patient Refused

## 2024-03-18 NOTE — Plan of Care (Signed)
  Problem: Clinical Measurements: Goal: Ability to maintain clinical measurements within normal limits will improve Outcome: Progressing Goal: Will remain free from infection Outcome: Progressing Goal: Diagnostic test results will improve Outcome: Progressing Goal: Respiratory complications will improve Outcome: Progressing   Problem: Activity: Goal: Risk for activity intolerance will decrease Outcome: Progressing   Problem: Nutrition: Goal: Adequate nutrition will be maintained Outcome: Progressing   Problem: Coping: Goal: Level of anxiety will decrease Outcome: Progressing   Problem: Elimination: Goal: Will not experience complications related to bowel motility Outcome: Progressing   Problem: Pain Managment: Goal: General experience of comfort will improve and/or be controlled Outcome: Progressing   Problem: Safety: Goal: Ability to remain free from injury will improve Outcome: Progressing   Problem: Skin Integrity: Goal: Risk for impaired skin integrity will decrease Outcome: Progressing   Problem: Coping: Goal: Ability to adjust to condition or change in health will improve Outcome: Progressing   Problem: Metabolic: Goal: Ability to maintain appropriate glucose levels will improve Outcome: Progressing   Problem: Nutritional: Goal: Maintenance of adequate nutrition will improve Outcome: Progressing Goal: Progress toward achieving an optimal weight will improve Outcome: Progressing   Problem: Skin Integrity: Goal: Risk for impaired skin integrity will decrease Outcome: Progressing   Problem: Education: Goal: Knowledge of secondary prevention will improve (MUST DOCUMENT ALL) Outcome: Progressing Goal: Knowledge of patient specific risk factors will improve (DELETE if not current risk factor) Outcome: Progressing   Problem: Intracerebral Hemorrhage Tissue Perfusion: Goal: Complications of Intracerebral Hemorrhage will be minimized Outcome: Progressing    Problem: Health Behavior/Discharge Planning: Goal: Goals will be collaboratively established with patient/family Outcome: Progressing   Problem: Self-Care: Goal: Ability to communicate needs accurately will improve Outcome: Progressing   Problem: Nutrition: Goal: Risk of aspiration will decrease Outcome: Progressing Goal: Dietary intake will improve Outcome: Progressing   Problem: Education: Goal: Knowledge of disease and its progression will improve Outcome: Progressing Goal: Individualized Educational Video(s) Outcome: Progressing   Problem: Fluid Volume: Goal: Compliance with measures to maintain balanced fluid volume will improve Outcome: Progressing   Problem: Health Behavior/Discharge Planning: Goal: Ability to manage health-related needs will improve Outcome: Progressing   Problem: Nutritional: Goal: Ability to make healthy dietary choices will improve Outcome: Progressing   Problem: Clinical Measurements: Goal: Complications related to the disease process, condition or treatment will be avoided or minimized Outcome: Progressing

## 2024-03-19 ENCOUNTER — Inpatient Hospital Stay (HOSPITAL_COMMUNITY)

## 2024-03-19 DIAGNOSIS — Z4659 Encounter for fitting and adjustment of other gastrointestinal appliance and device: Secondary | ICD-10-CM | POA: Diagnosis not present

## 2024-03-19 DIAGNOSIS — N186 End stage renal disease: Secondary | ICD-10-CM | POA: Diagnosis not present

## 2024-03-19 DIAGNOSIS — I609 Nontraumatic subarachnoid hemorrhage, unspecified: Secondary | ICD-10-CM | POA: Diagnosis not present

## 2024-03-19 DIAGNOSIS — R112 Nausea with vomiting, unspecified: Secondary | ICD-10-CM | POA: Diagnosis not present

## 2024-03-19 LAB — COMPREHENSIVE METABOLIC PANEL WITH GFR
ALT: 11 U/L (ref 0–44)
AST: 20 U/L (ref 15–41)
Albumin: 2.8 g/dL — ABNORMAL LOW (ref 3.5–5.0)
Alkaline Phosphatase: 111 U/L (ref 38–126)
Anion gap: 15 (ref 5–15)
BUN: 35 mg/dL — ABNORMAL HIGH (ref 6–20)
CO2: 31 mmol/L (ref 22–32)
Calcium: 10.4 mg/dL — ABNORMAL HIGH (ref 8.9–10.3)
Chloride: 86 mmol/L — ABNORMAL LOW (ref 98–111)
Creatinine, Ser: 5 mg/dL — ABNORMAL HIGH (ref 0.44–1.00)
GFR, Estimated: 10 mL/min — ABNORMAL LOW (ref >60)
Glucose, Bld: 104 mg/dL — ABNORMAL HIGH (ref 70–99)
Potassium: 4.2 mmol/L (ref 3.5–5.1)
Sodium: 132 mmol/L — ABNORMAL LOW (ref 135–145)
Total Bilirubin: 0.5 mg/dL (ref 0.0–1.2)
Total Protein: 6.3 g/dL — ABNORMAL LOW (ref 6.5–8.1)

## 2024-03-19 LAB — CBC WITH DIFFERENTIAL/PLATELET
Abs Immature Granulocytes: 0.1 K/uL — ABNORMAL HIGH (ref 0.00–0.07)
Basophils Absolute: 0 K/uL (ref 0.0–0.1)
Basophils Relative: 0 %
Eosinophils Absolute: 0.1 K/uL (ref 0.0–0.5)
Eosinophils Relative: 2 %
HCT: 27.5 % — ABNORMAL LOW (ref 36.0–46.0)
Hemoglobin: 9 g/dL — ABNORMAL LOW (ref 12.0–15.0)
Immature Granulocytes: 2 %
Lymphocytes Relative: 24 %
Lymphs Abs: 1.3 K/uL (ref 0.7–4.0)
MCH: 31.4 pg (ref 26.0–34.0)
MCHC: 32.7 g/dL (ref 30.0–36.0)
MCV: 95.8 fL (ref 80.0–100.0)
Monocytes Absolute: 0.5 K/uL (ref 0.1–1.0)
Monocytes Relative: 9 %
Neutro Abs: 3.4 K/uL (ref 1.7–7.7)
Neutrophils Relative %: 63 %
Platelets: 247 K/uL (ref 150–400)
RBC: 2.87 MIL/uL — ABNORMAL LOW (ref 3.87–5.11)
RDW: 15 % (ref 11.5–15.5)
WBC: 5.4 K/uL (ref 4.0–10.5)
nRBC: 0 % (ref 0.0–0.2)

## 2024-03-19 LAB — GLUCOSE, CAPILLARY
Glucose-Capillary: 117 mg/dL — ABNORMAL HIGH (ref 70–99)
Glucose-Capillary: 121 mg/dL — ABNORMAL HIGH (ref 70–99)
Glucose-Capillary: 139 mg/dL — ABNORMAL HIGH (ref 70–99)
Glucose-Capillary: 95 mg/dL (ref 70–99)
Glucose-Capillary: 92 mg/dL (ref 70–99)

## 2024-03-19 LAB — PHOSPHORUS: Phosphorus: 4.3 mg/dL (ref 2.5–4.6)

## 2024-03-19 LAB — MAGNESIUM: Magnesium: 3 mg/dL — ABNORMAL HIGH (ref 1.7–2.4)

## 2024-03-19 MED ORDER — ACETAMINOPHEN 325 MG PO TABS
ORAL_TABLET | ORAL | Status: AC
  Filled 2024-03-19: qty 2

## 2024-03-19 MED ORDER — ONDANSETRON HCL 4 MG/2ML IJ SOLN
INTRAMUSCULAR | Status: AC
  Filled 2024-03-19: qty 2

## 2024-03-19 MED ORDER — HYDROMORPHONE HCL 1 MG/ML IJ SOLN
INTRAMUSCULAR | Status: AC
  Filled 2024-03-19: qty 0.5

## 2024-03-19 NOTE — Progress Notes (Signed)
 [facility] Progress Note  Subjective:  Last HD on 7/14 with 2 kg UF.   Hasn't had HD yet today.  She doesn't feel good - citing pain and nausea as below.   Review of systems:     Denies shortness of breath  Still struggles with pain control   Some nausea   Vitals:   03/19/24 0750 03/19/24 0940 03/19/24 1100 03/19/24 1240  BP: (!) 144/64 (!) 144/64 (!) 178/78 (!) 178/78  Pulse: 95   93  Resp: 11   10  Temp: 98.6 F (37 C)  98.9 F (37.2 C) 98.9 F (37.2 C)  TempSrc: Oral  Oral Oral  SpO2: 95%     Weight:      Height:        Physical Exam:     General adult female in bed in NAD HEENT normocephalic atraumatic extraocular movements intact sclera anicteric Neck supple trachea midline Lungs clear to auscultation bilaterally normal work of breathing at rest; on room air Heart S1S2 no rub Abdomen soft nontender nondistended Extremities no edema  Psych normal mood and affect Access RUE AVF with bruit and thrill; aneurysmal region    OP HD:  MWF AF 3h  B400   57.2kg  2K   AVF  Heparin  none - Hectoral 4mcg IV q HD - Mircera 150mcg IV q 2 weeks (last given 6/9 -> due 6/23)   Assessment/Plan: Intracerebral hemorrhage: C/b hydrocephalus, 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 TCDs with evidence of vasospasm 02/11/24. Followed closely by neurosurgery, evd removed 6/30 CVA: New CVA found on 7/1 ESRD:  HD per MWF schedule.   S/p CRRT 6/12-6/15, then back on iHD.   Per charting, pain meds were ordered prn for during HD to try and keep her from signing off early.   Hyperkalemia: resolved w/ TF changed to Nepro Volume: no vol excess on exam. Note she is below her EDW.  HTN: optimize volume with HD. Started back amlodipine  at 5 mg daily on 7/15  Anemia of ESRD: increased aranesp  to 100 mcg every Friday. Iron  adequate Secondary HPTH: Ca and Phos high, VDRA stopped 6/21, added sevelamer  via tube. updated intact PTH is pending. Could consider low-dose Sensipar   if needed.   Pancreas/ kidney transplant (2014): for the functioning pancreas pt is getting her home dosing of CyA 225mg  qam and 200 mg qpm + Cellcept  and prednisone . CyA level 7/09 was 109 (goal 100-200) which is in range.     Disposition - per primary team    Recent Labs  Lab 03/17/24 0542 03/19/24 0448  HGB 8.4* 9.0*  ALBUMIN 2.8* 2.8*  CALCIUM  10.2 10.4*  PHOS 5.3* 4.3  CREATININE 6.96* 5.00*  K 4.2 4.2   No results for input(s): IRON , TIBC, FERRITIN in the last 168 hours. Inpatient medications:  amLODipine   5 mg Oral Daily   aspirin   81 mg Oral Daily   Chlorhexidine  Gluconate Cloth  6 each Topical Q0600   Chlorhexidine  Gluconate Cloth  6 each Topical Q0600   cycloSPORINE   200 mg Oral QHS   cycloSPORINE   225 mg Oral Daily   [START ON 03/21/2024] darbepoetin (ARANESP ) injection - DIALYSIS  100 mcg Subcutaneous Q Fri-1800   feeding supplement  237 mL Oral BID BM   feeding supplement (NEPRO CARB STEADY)  1,000 mL Per Tube Q24H   heparin  injection (subcutaneous)  5,000 Units Subcutaneous Q8H   insulin  aspart  0-9 Units Subcutaneous Q4H   lacosamide   100 mg Oral BID  metoCLOPramide   5 mg Oral TID AC   Or   metoCLOPramide   5 mg Per Tube TID AC   multivitamin  1 tablet Oral QHS   mycophenolate   500 mg Oral BID   predniSONE   15 mg Oral Q breakfast   sevelamer  carbonate  2.4 g Oral TID WC   sodium chloride  flush  3 mL Intravenous Q12H     acetaminophen  **OR** acetaminophen , albuterol , artificial tears, atropine , butalbital -acetaminophen -caffeine , Gerhardt's butt cream, hydrALAZINE , HYDROmorphone  (DILAUDID ) injection, iohexol , labetalol , loperamide  HCl, naLOXone  (NARCAN )  injection, nitroGLYCERIN , ondansetron  (ZOFRAN ) IV, mouth rinse, prochlorperazine , simethicone     Katheryn JAYSON Saba, MD 1:22 PM 03/19/2024

## 2024-03-19 NOTE — Consult Note (Signed)
 Chief Complaint: Patient was seen in consultation today for nausea with vomiting, colitis, gastroparesis, and malnutrition, with consideration for gastrojejunostomy tube placement for nutrition and gastric venting.  Referring Provider(s): Dr. Alejandro Marker, DO; Dr. Nydia Distance, MD.  Supervising Physician: Philip Cornet  Patient Status: [name] - In-pt  Patient is Full Code  History of Present Illness: [name] is a 43 y.o. 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 Lavonne progress note today: Principal Problem: SAH (subarachnoid hemorrhage) (HCC), ICH with brain compression, communicating hydrocephalus - seen by neurology and neurosurgery s/p right frontal pressure ventriculostomy via burr hole and EVD was removed on 6/30. -MRI brain showed small foci of hemorrhage in the posterior left cerebellum without evidence for acute infarct. Punctate focus of acute infarct involving the medial left occipital cortex, evolving subacute infarct in the left parietal lobe, trace blood products.  No change in management per neurology and neurosurgery. - Due to concern for hydrocephalus, neurosurgery did LP, spinal fluid pressures were low and ruled out meningitis.  Per Dr. Mavis, she would not benefit from a VP shunt  -Palliative medicine following for GOC.  [...] Vasospasm with left cerebellar infarct:  -Seen by Neurology.  Noted to be on aspirin . -LDL 63.  HbA1c 4.2. - PT evaluation recommended SNF   Oropharyngeal Dysphagia: - Mentation has improved, placed on full liquid diet however failed calorie count, has nausea vomiting.  - Dietitians recommending J-tube w/venting G-tube and she seems to be agreeable. - IR following, continue core track for now   Interventional Radiology was requested for gastrojejunostomy tube placement. Request was reviewed and approved by Dr. Philip.  Patient is scheduled for same in IR today.   Patient is alert and laying in bed, calm.  Patient is currently without any significant complaints.  Patient denies any fevers, headache, chest pain, SOB, cough, abdominal pain, nausea, vomiting or bleeding.     Past Medical History:  Diagnosis Date   Anemia    Anxiety    DM (diabetes mellitus), type 1 (HCC)    ESRD on hemodialysis (HCC)    Gastroparesis    History of simultaneous kidney and pancreas transplant (HCC) 2014   Hypertension    Ischemic cerebrovascular accident (CVA) (HCC) 11/2021   Narcotic abuse (HCC)    Non compliance with medical treatment    Stroke [facility])    Vision loss     Past Surgical History:  Procedure Laterality Date   A/V FISTULAGRAM Right 02/17/2021   Procedure: A/V FISTULAGRAM;  Surgeon: Gretta Lonni PARAS, MD;  Location: [facility] INVASIVE CV LAB;  Service: Cardiovascular;  Laterality: Right;   AV FISTULA PLACEMENT  11/17/2011   Procedure: ARTERIOVENOUS (AV) FISTULA CREATION;  Surgeon: Krystal JULIANNA Doing, MD;  Location: [facility] OR;  Service: Vascular;  Laterality: Right;   BIOPSY  12/03/2021   Procedure: BIOPSY;  Surgeon: Legrand Victory LITTIE DOUGLAS, MD;  Location: [facility] ENDOSCOPY;  Service: Gastroenterology;;   ESOPHAGOGASTRODUODENOSCOPY (EGD) WITH PROPOFOL  N/A 12/03/2021   Procedure: ESOPHAGOGASTRODUODENOSCOPY (EGD) WITH PROPOFOL ;  Surgeon: Legrand Victory LITTIE DOUGLAS, MD;  Location: [facility] ENDOSCOPY;  Service: Gastroenterology;  Laterality: N/A;   EYE SURGERY     HEMATOMA EVACUATION Right 02/25/2021   Procedure: EVACUATION HEMATOMA RIGHT CHEST;  Surgeon: Sheree Penne Lonni, MD;  Location: [name] OR;  Service: Vascular;  Laterality: Right;   INSERTION OF DIALYSIS CATHETER Right 12/08/2020   Procedure:  INSERTION OF TUNNELED DIALYSIS CATHETER;  Surgeon: Gretta Lonni PARAS, MD;  Location: [name] OR;  Service: Vascular;  Laterality: Right;   IR ANGIO INTRA EXTRACRAN SEL INTERNAL CAROTID BILAT MOD SED  02/27/2024   IR ANGIO VERTEBRAL SEL VERTEBRAL UNI R MOD  SED  02/27/2024   IR REMOVAL TUN CV CATH W/O FL  05/03/2021   KIDNEY TRANSPLANT     NEPHRECTOMY TRANSPLANTED ORGAN     pancrease transplant     PERIPHERAL VASCULAR BALLOON ANGIOPLASTY Right 02/17/2021   Procedure: PERIPHERAL VASCULAR BALLOON ANGIOPLASTY;  Surgeon: Gretta Lonni PARAS, MD;  Location: [facility] INVASIVE CV LAB;  Service: Cardiovascular;  Laterality: Right;   REFRACTIVE SURGERY     REVISON OF ARTERIOVENOUS FISTULA Right 12/08/2020   Procedure: RIGHT ARM ARTERIOVENOUS FISTULA REVISION AND RESECTION;  Surgeon: Gretta Lonni PARAS, MD;  Location: [facility] OR;  Service: Vascular;  Laterality: Right;   REVISON OF ARTERIOVENOUS FISTULA Right 02/25/2021   Procedure: RIGHT ARM ARTERIOVENOUS FISTULA REVISION WITH TRANSPOSITION OF CEPHALIC VEIN ON AXILLARY VEIN;  Surgeon: Gretta Lonni PARAS, MD;  Location: [facility] OR;  Service: Vascular;  Laterality: Right;    Allergies: Patient has no known allergies.  Medications: Prior to Admission medications   Medication Sig Start Date End Date Taking? Authorizing Provider  cycloSPORINE  (SANDIMMUNE ) 25 MG capsule Take 25 mg by mouth in the morning.   Yes [provider]  amLODipine  (NORVASC ) 10 MG tablet Take 1 tablet (10 mg total) by mouth daily. 02/05/22 11/06/23  Arlice Reichert, MD  aspirin  EC 81 MG tablet Chew 81 mg by mouth daily.    [provider]  busPIRone  (BUSPAR ) 10 MG tablet Take 10 mg by mouth 2 (two) times daily. 10/25/23   [provider]  cinacalcet  (SENSIPAR ) 30 MG tablet Take 3 tablets (90 mg total) by mouth daily with supper. 06/14/23   Amin, Sumayya, MD  cloNIDine  (CATAPRES  - DOSED IN MG/24 HR) 0.2 mg/24hr patch 0.2 mg once a week.    [provider]  cycloSPORINE  modified (NEORAL ) 100 MG capsule Take 2 capsules (200 mg total) by mouth 2 (two) times daily. 12/25/21   Pokhrel, Laxman, MD  Darbepoetin Alfa  (ARANESP ) 60 MCG/0.3ML SOSY injection Inject 0.3 mLs (60 mcg total) into the vein every Friday with hemodialysis.  11/18/21   Danton Reyes DASEN, MD  doxercalciferol  (HECTOROL ) 4 MCG/2ML injection Inject 3 mLs (6 mcg total) into the vein every Monday, Wednesday, and Friday with hemodialysis. 11/17/21   Danton Reyes DASEN, MD  DULoxetine  (CYMBALTA ) 20 MG capsule Take 1 capsule (20 mg total) by mouth daily. 02/06/22 11/06/23  Arlice Reichert, MD  ferric citrate  (AURYXIA ) 1 GM 210 MG(Fe) tablet Take 210-630 mg by mouth See admin instructions. Take 3 tablets (630 mg) by mouth three times daily with meals, and take 1 tablet (210 mg) twice daily with snacks    [provider]  gabapentin  (NEURONTIN ) 100 MG capsule Take 100 mg by mouth 2 (two) times daily. 10/25/23   [provider]  hydrALAZINE  (APRESOLINE ) 25 MG tablet Take 3 tablets (75 mg total) by mouth every 8 (eight) hours. 02/05/22 11/06/23  Arlice Reichert, MD  hydrOXYzine  (VISTARIL ) 25 MG capsule Take 25 mg by mouth 3 (three) times daily as needed for anxiety. 05/26/23   [provider]  lidocaine -prilocaine  (EMLA ) cream Apply 1 application  topically daily as needed (prior to port access). 07/09/20   [provider]  losartan  (COZAAR ) 100 MG tablet Take 1 tablet (100 mg total) by mouth daily. 12/04/21  11/06/23  [place], Eric J, DO  MELATONIN PO Take 1 tablet by mouth at bedtime.    [provider]  metoCLOPramide  (REGLAN ) 5 MG tablet Take 5 mg by mouth 2 (two) times daily before a meal.    [provider]  Mycophenolate  Sodium (MYCOPHENOLIC ACID ) 180 MG TBEC Take 360 mg by mouth 2 (two) times daily.    [provider]  ondansetron  (ZOFRAN ) 4 MG tablet Take 4 mg by mouth every 8 (eight) hours as needed for nausea or vomiting.    [provider]  oxyCODONE -acetaminophen  (PERCOCET/ROXICET) 5-325 MG tablet Take 1 tablet by mouth 3 (three) times daily.    [provider]  predniSONE  (DELTASONE ) 5 MG tablet Take 15 mg by mouth daily. 06/24/15   [provider]  sodium zirconium cyclosilicate   (LOKELMA ) 10 g PACK packet Take 10 g by mouth daily.    [provider]  XPHOZAH 30 MG TABS Take 1 tablet by mouth 2 (two) times daily. 06/21/23   [provider]  zolpidem  (AMBIEN ) 10 MG tablet Take 5 mg by mouth at bedtime as needed for sleep.    [provider]     Family History  Problem Relation Age of Onset   Hypertension Father     Social History   Socioeconomic History   Marital status: Married    Spouse name: Not on file   Number of children: Not on file   Years of education: Not on file   Highest education level: Not on file  Occupational History   Not on file  Tobacco Use   Smoking status: Former    Types: Pipe   Smokeless tobacco: Never   Tobacco comments:    Hooka  Vaping Use   Vaping status: Never Used  Substance and Sexual Activity   Alcohol  use: No   Drug use: No   Sexual activity: Not Currently  Other Topics Concern   Not on file  Social History Narrative   Worked for [employer]--as did husband. Had to leave [place] for safety reasons. Has been in [place]  since 9/09.   Social Drivers of Corporate investment banker Strain: Not on file  Food Insecurity: Patient Unable To Answer (02/15/2024)   Hunger Vital Sign    Worried About Running Out of Food in the Last Year: Patient unable to answer    Ran Out of Food in the Last Year: Patient unable to answer  Transportation Needs: Patient Unable To Answer (02/15/2024)   PRAPARE - Transportation    Lack of Transportation (Medical): Patient unable to answer    Lack of Transportation (Non-Medical): Patient unable to answer  Physical Activity: Not on file  Stress: Not on file  Social Connections: Unknown (10/04/2022)   Received from [facility]   Social Network    Social Network: Not on file     Review of Systems: A 12 point ROS discussed and pertinent positives are indicated in the HPI above.  All other systems are negative.  Vital Signs: BP (!) 178/78 (BP Location: Right Wrist)   Pulse  93   Temp 98.9 F (37.2 C) (Oral)   Resp 10   Ht 5' 7.5 (1.715 m)   Wt 118 lb 2.7 oz (53.6 kg)   LMP 02/13/2024 (Exact Date)   SpO2 93%   BMI 18.23 kg/m   Advance Care Plan: The advanced care place/surrogate decision maker was discussed at the time of visit and the patient did not wish to discuss  or was not able to name a surrogate decision maker or provide an advance care plan.  Physical Exam Constitutional:      General: She is not in acute distress.    Appearance: Normal appearance.  HENT:     Mouth/Throat:     Mouth: Mucous membranes are moist.  Cardiovascular:     Rate and Rhythm: Regular rhythm. Tachycardia present.     Pulses: Normal pulses.  Pulmonary:     Effort: Pulmonary effort is normal.     Breath sounds: Normal breath sounds.  Abdominal:     General: Abdomen is flat.     Palpations: Abdomen is soft.     Tenderness: There is no abdominal tenderness.  Musculoskeletal:        General: Normal range of motion.     Cervical back: Normal range of motion.  Skin:    General: Skin is warm and dry.  Neurological:     Mental Status: She is alert and oriented to person, place, and time.  Psychiatric:        Mood and Affect: Mood normal.        Behavior: Behavior normal.        Thought Content: Thought content normal.        Judgment: Judgment normal.     Imaging: CT ABDOMEN WO CONTRAST Result Date: 03/19/2024 CLINICAL DATA:  Evaluation of operative window for G-J tube placement in IR EXAM: CT ABDOMEN WITHOUT CONTRAST TECHNIQUE: Multidetector CT imaging of the abdomen was performed following the standard protocol without IV contrast. RADIATION DOSE REDUCTION: This exam was performed according to the departmental dose-optimization program which includes automated exposure control, adjustment of the mA and/or kV according to patient size and/or use of iterative reconstruction technique. COMPARISON:  02/13/2024 FINDINGS: Lower chest: Dependent bibasilar opacities  compatible with atelectasis. No effusions. Mild cardiomegaly. Coronary artery and aortic atherosclerosis. Hepatobiliary: Small layering gallstones within the gallbladder. No biliary ductal dilatation or focal hepatic abnormality. Pancreas: No focal abnormality or ductal dilatation. Spleen: Splenomegaly with a craniocaudal length 14.7 cm. Scattered calcifications throughout the spleen. No suspicious focal abnormality. Adrenals/Urinary Tract: Adrenal glands normal. Atrophic kidneys with diffuse renovascular calcifications. No suspicious focal renal abnormality or hydronephrosis. Stomach/Bowel: Feeding tube coils in the fundus of the stomach with the tip in the distal stomach. Stomach, visualized large and small bowel decompressed. No bowel obstruction or inflammatory process. Clear operative window to the mid/distal stomach present. Vascular/Lymphatic: Aortic atherosclerosis. No evidence of aneurysm or adenopathy. Other: No free fluid or free air. Musculoskeletal: No acute bony abnormality. IMPRESSION: Splenomegaly. Cholelithiasis.  No CT evidence of acute cholecystitis. Bilateral atrophic kidneys. Heavily calcified aorta and branch vessels. No acute findings. Electronically Signed   By: Franky Crease M.D.   On: 03/19/2024 11:43   ECHOCARDIOGRAM LIMITED Result Date: 03/16/2024    ECHOCARDIOGRAM LIMITED REPORT   Patient Name:   [name] Date of Exam: 03/16/2024 Medical Rec #:  [id number]          Height:       67.5 in Accession #:    [id number]         Weight:       114.6 lb Date of Birth:  Nov 29, 1980           BSA:          1.605 m Patient Age:    43 years           BP:  151/68 mmHg Patient Gender: F                  HR:           100 bpm. Exam Location:  Inpatient Procedure: Limited Echo, Limited Color Doppler and Cardiac Doppler (Both            Spectral and Color Flow Doppler were utilized during procedure). Indications:    Chest Pain  History:        Patient has prior history of Echocardiogram  examinations.                 Signs/Symptoms:Chest Pain.  Sonographer:    Vella Key Referring Phys: [id number] OMAIR LATIF [name] IMPRESSIONS  1. Left ventricular ejection fraction, by estimation, is 60 to 65%. The left ventricle has normal function. The left ventricle has no regional wall motion abnormalities. There is mild left ventricular hypertrophy. Left ventricular diastolic parameters are indeterminate.  2. Right ventricular systolic function is normal. The right ventricular size is normal.  3. A small pericardial effusion is present.  4. Trivial mitral valve regurgitation.  5. The aortic valve is tricuspid. Aortic valve regurgitation is not visualized. Aortic valve sclerosis/calcification is present, without any evidence of aortic stenosis.  6. The inferior vena cava is normal in size with greater than 50% respiratory variability, suggesting right atrial pressure of 3 mmHg. FINDINGS  Left Ventricle: Left ventricular ejection fraction, by estimation, is 60 to 65%. The left ventricle has normal function. The left ventricle has no regional wall motion abnormalities. The left ventricular internal cavity size was normal in size. There is  mild left ventricular hypertrophy. Left ventricular diastolic parameters are indeterminate. Right Ventricle: The right ventricular size is normal. Right vetricular wall thickness was not assessed. Right ventricular systolic function is normal. Left Atrium: Left atrial size was normal in size. Right Atrium: Right atrial size was normal in size. Pericardium: A small pericardial effusion is present. Mitral Valve: There is mild thickening of the mitral valve leaflet(s). Mild to moderate mitral annular calcification. Trivial mitral valve regurgitation. Tricuspid Valve: The tricuspid valve is normal in structure. Aortic Valve: The aortic valve is tricuspid. Aortic valve regurgitation is not visualized. Aortic valve sclerosis/calcification is present, without any evidence of aortic  stenosis. Pulmonic Valve: The pulmonic valve was normal in structure. Pulmonic valve regurgitation is not visualized. Aorta: The aortic root and ascending aorta are structurally normal, with no evidence of dilitation. Venous: The inferior vena cava is normal in size with greater than 50% respiratory variability, suggesting right atrial pressure of 3 mmHg. IAS/Shunts: No atrial level shunt detected by color flow Doppler. LEFT VENTRICLE PLAX 2D LVIDd:         3.80 cm LVIDs:         2.60 cm LV PW:         1.20 cm LV IVS:        1.20 cm LVOT diam:     1.60 cm LV SV:         45 LV SV Index:   28 LVOT Area:     2.01 cm  LV Volumes (MOD) LV vol d, MOD A2C: 83.7 ml LV vol d, MOD A4C: 98.2 ml LV vol s, MOD A2C: 28.2 ml LV vol s, MOD A4C: 35.2 ml LV SV MOD A2C:     55.5 ml LV SV MOD A4C:     98.2 ml LV SV MOD BP:      61.3 ml LEFT ATRIUM  Index LA diam:      3.30 cm 2.06 cm/m LA Vol (A4C): 46.6 ml 29.03 ml/m  AORTIC VALVE LVOT Vmax:   125.00 cm/s LVOT Vmean:  91.800 cm/s LVOT VTI:    0.225 m  AORTA Ao Root diam: 2.60 cm  SHUNTS Systemic VTI:  0.22 m Systemic Diam: 1.60 cm Vina Gull MD Electronically signed by Vina Gull MD Signature Date/Time: 03/16/2024/5:27:59 PM    Final    DG Abd 1 View Result Date: 03/14/2024 CLINICAL DATA:  Feeding tube placement EXAM: ABDOMEN - 1 VIEW FLUOROSCOPY TIME:  Fluoroscopy Time:  42 seconds Radiation Exposure Index (if provided by the fluoroscopic device): 1.6 mGy Number of Acquired Spot Images: 1 COMPARISON:  Radiograph 03/13/2024 FINDINGS: Enteric tube tip in the third portion of the duodenum. Instilled enteric contrast is present in the descending duodenum. IMPRESSION: Enteric tube tip in the third portion of the duodenum. Electronically Signed   By: Norman Gatlin M.D.   On: 03/14/2024 18:05   DG Abd 1 View Result Date: 03/13/2024 CLINICAL DATA:  Feeding tube placement EXAM: ABDOMEN - 1 VIEW COMPARISON:  Abdominal radiograph dated 03/12/2024 FINDINGS: Enteric tube tip  projects over the right upper quadrant, likely within the proximal duodenum. Contrast opacification of the proximal duodenal lumen. IMPRESSION: Enteric tube tip projects over the right upper quadrant, likely within the proximal duodenum. Electronically Signed   By: Limin  Xu M.D.   On: 03/13/2024 14:47   DG Abd Portable 1V Result Date: 03/12/2024 CLINICAL DATA:  [id number] Encounter for feeding tube placement [id number] EXAM: PORTABLE ABDOMEN - 1 VIEW COMPARISON:  March 04, 2024 FINDINGS: Weighted feeding tube is coiled in the stomach. Nonobstructive bowel gas pattern. No pneumoperitoneum. No organomegaly or radiopaque calculi. The lung bases are clear. IMPRESSION: Redemonstrated weighted feeding tube, which has been retracted in the interim, now coiled in the stomach. Electronically Signed   By: Rogelia Myers M.D.   On: 03/12/2024 12:34   MR BRAIN WO CONTRAST Result Date: 03/10/2024 CLINICAL DATA:  Stroke evaluation, new stroke noted on CT. EXAM: MRI HEAD WITHOUT CONTRAST TECHNIQUE: Multiplanar, multiecho pulse sequences of the brain and surrounding structures were obtained without intravenous contrast. COMPARISON:  Earlier same day CT head, MRI head 03/04/2024. FINDINGS: Brain: Redemonstrated evolving subacute infarct in the left parietal lobe with areas of petechial hemorrhage along the left parietal cortex. There is a punctate focus of acute infarct within the medial left occipital cortex. No evidence of acute infarct within the posterior left cerebellum. There are areas of susceptibility in the left cerebellum corresponding to hemorrhage noted on CT. No significant associated edema. Chronic microvascular ischemic changes. Remote infarcts in the right corona radiata, bilateral basal ganglia, pons, and inferomedial left cerebellum. Ventricles: Removal of previously noted right frontal approach EVD catheter with encephalomalacia along the catheter tract noted. Similar size and configuration of the ventricles. Minimal  gas noted within the right frontal horn. Trace blood products layering in the occipital horns which are slightly decreased. Vascular: Skull base flow voids are visualized. Skull and upper cervical spine: No focal abnormality. Sinuses/Orbits: Bilateral lens replacement.  Sinuses are clear. Other: Bilateral mastoid effusions. IMPRESSION: Small foci of hemorrhage in the posterior left cerebellum without evidence of acute infarct. Punctate focus of acute infarct involving the medial left occipital cortex. 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 Trace blood products in the occipital horns, decreased from prior. Chronic microvascular ischemic changes and remote infarcts again noted. Electronically Signed   By: Donnice Mania  M.D.   On: 03/10/2024 22:35   CT HEAD WO CONTRAST ( ) Result Date: 03/10/2024 CLINICAL DATA:  Provided history: Subarachnoid hemorrhage. EXAM: CT HEAD WITHOUT CONTRAST TECHNIQUE: Contiguous axial images were obtained from the base of the skull through the vertex without intravenous contrast. RADIATION DOSE REDUCTION: This exam was performed according to the departmental dose-optimization program which includes automated exposure control, adjustment of the mA and/or kV according to patient size and/or use of iterative reconstruction technique. COMPARISON:  Brain MRI 03/04/2024. Head CT 03/04/2024. Head CT 02/20/2024. FINDINGS: Brain: A previously demonstrated right frontal approach EVD has been removed. Decreased pneumoventricle, now with only small-volume gas present within the right lateral ventricle frontal horn. Persistent small-volume hemorrhage layering within the occipital horns of both lateral ventricles. Ventricular dilation has not significantly changed. Known early subacute infarct within the left parietal lobe and at the left temporoparietal junction. Progressive gyriform hyperdensity within portions of this infarct, which may reflect petechial hemorrhage and/or cortical  laminar necrosis. New focus of ill-defined hyperdensity within the posterior left cerebellar hemisphere suspicious for an interval infarct with petechial hemorrhage. Previously demonstrated subarachnoid hemorrhage is no longer appreciated. Redemonstrated chronic infarcts within the right corona radiata/basal ganglia, left basal ganglia, left aspect of the pons and inferomedial left cerebellar hemisphere. Mild patchy and ill-defined hypoattenuation elsewhere within the cerebral white matter, nonspecific but compatible chronic small vessel ischemic disease. No evidence of an intracranial mass. No midline shift. Vascular: No hyperdense vessel.  Atherosclerotic calcifications. Skull: Right frontal burr hole. No calvarial fracture or aggressive osseous lesion. Sinuses/Orbits: No mass or acute finding within the imaged orbits. Mild mucosal thickening within the left sphenoid sinus. Partially visualized nasoenteric tube. Other: Small-volume fluid within bilateral mastoid air cells. Impressions #3 and #4 will be called to the ordering clinician or representative by the Radiologist Assistant, and communication documented in the PACS or [employer]. IMPRESSION: 1. A previously demonstrated right frontal approach EVD has been removed. Decreased pneumoventricle. 2. Persistent small-volume hemorrhage layering within the occipital horns of both lateral ventricles. Ventricular dilation has not significantly changed. 3. Known early subacute infarct within the left parietal lobe and at the left temporoparietal junction. Progressive gyriform hyperdensity within portions of this infarct, which may reflect petechial hemorrhage and/or cortical laminar necrosis. 4. New focus of ill-defined hyperdensity within the posterior left cerebellar hemisphere suspicious for an interval infarct with petechial hemorrhage. Consider a brain MRI for further evaluation. 5. Previously demonstrated subarachnoid hemorrhage is no longer appreciated.  Electronically Signed   By: Rockey Childs D.O.   On: 03/10/2024 10:26   VAS US  TRANSCRANIAL DOPPLER Result Date: 03/06/2024  Transcranial Doppler Patient Name:  JACIE [facility]STAN  Date of Exam:   03/05/2024 Medical Rec #: [id number]           Accession #:    [id number] Date of Birth: 1980-11-27            Patient Gender: F Patient Age:   89 years Exam Location[facility]  [facility] Procedure:      VAS US  TRANSCRANIAL DOPPLER Referring Phys: ARY XU --------------------------------------------------------------------------------  Indications: Subarachnoid hemorrhage. History: Right frontal ventriculostomy on 02/14/2024. Performing Technologist: Ezzie Potters RVT, RDMS  Examination Guidelines: A complete evaluation includes B-mode imaging, spectral Doppler, color Doppler, and power Doppler as needed of all accessible portions of each vessel. Bilateral testing is considered an integral part of a complete examination. Limited examinations for reoccurring indications may be performed as noted.  +----------+---------------+----------+-----------+------------------+ RIGHT TCD Right VM (cm/s)Depth (cm)Pulsatility  Comment       +----------+---------------+----------+-----------+------------------+ MCA             75          4.90      1.45       PSV 148 cm/s    +----------+---------------+----------+-----------+------------------+ ACA                                           unable to insonate +----------+---------------+----------+-----------+------------------+ Term ICA        39                    1.45                       +----------+---------------+----------+-----------+------------------+ PCA P1          34                    1.69                       +----------+---------------+----------+-----------+------------------+ Opthalmic       17                    1.99                       +----------+---------------+----------+-----------+------------------+ ICA siphon                                     unable to insonate +----------+---------------+----------+-----------+------------------+ Vertebral       -15                   1.27                       +----------+---------------+----------+-----------+------------------+ Distal ICA      38                    1.40                       +----------+---------------+----------+-----------+------------------+  +----------+--------------+----------+-----------+------------------+ LEFT TCD  Left VM (cm/s)Depth (cm)Pulsatility     Comment       +----------+--------------+----------+-----------+------------------+ MCA             48                   1.42                       +----------+--------------+----------+-----------+------------------+ ACA            -19                   1.76                       +----------+--------------+----------+-----------+------------------+ Term ICA        27                   1.67                       +----------+--------------+----------+-----------+------------------+ PCA P1          24  1.36                       +----------+--------------+----------+-----------+------------------+ Opthalmic       23                   2.04                       +----------+--------------+----------+-----------+------------------+ ICA siphon                                   unable to insonate +----------+--------------+----------+-----------+------------------+ Vertebral      -37                   1.75                       +----------+--------------+----------+-----------+------------------+ Distal ICA     -30                   1.47                       +----------+--------------+----------+-----------+------------------+  +------------+-------+-------+             VM cm/sComment +------------+-------+-------+ Prox Basilar  -24   1.75   +------------+-------+-------+ Dist Basilar  -21   1.81   +------------+-------+-------+  +----------------------+----+ Right Lindegaard Ratio1.97 +----------------------+----+ +---------------------+---+ Left Lindegaard Ratio1.6 +---------------------+---+  Summary:   Normal mean flow velocities in identified vessels of anterior and posterior cerebral circulations. *See table(s) above for TCD measurements and observations.  Diagnosing physician: Eather Popp MD Electronically signed by Eather Popp MD on 03/06/2024 at 1:59:00 PM.    Final    Overnight EEG with video Result Date: 03/05/2024 Shelton Arlin KIDD, MD     03/06/2024 10:54 AM Patient Name: Maesyn Frisinger MRN: [id number] Epilepsy Attending: Arlin KIDD Shelton Referring Physician/Provider: Khaliqdina, Salman, MD Duration: 03/05/2024 9482 to 03/06/2024 9482 Patient history:  43 y.o. female with history of hypertension, ESRD on HD, s/p renal and pancreatic transplant, diabetes mellitus type 1 with gastroparesis presents with nausea and vomiting since after dialysis. Admitted initially for sepsis felt to be secondary to colitis. Found to have perimesencephalic SAH with IVH suspected to be either hypertensive in etiology vs secondary to peripontine venous anomaly. This was complicated by hydropcehalus s/p EVD placement. Imaging also demonstrated a small left cerbellar infarct, presumed secondary to vasospasm. Neurology reconsulted overnight for possible seizures. EEG to evaluate for seizure. Level of alertness:  comatose/ lethargic AEDs during EEG study: None Technical aspects: This EEG study was done with scalp electrodes positioned according to the 10-20 International system of electrode placement. Electrical activity was reviewed with band pass filter of 1-70Hz , sensitivity of 7 uV/mm, display speed of 51mm/sec with a 60Hz  notched filter applied as appropriate. EEG data were recorded continuously and digitally stored.  Video monitoring was available and reviewed as appropriate. Description: EEG showed continuous generalized polymorphic sharply  contoured 3 to 6 Hz theta-delta slowing, appeared more sharply contoured when awake/stimulated.  Sharp waves were also noted in left centro- parietal region. hyperventilation and photic stimulation were not performed.   ABNORMALITY -Sharp waves, left centro- parietal region - Continuous slow, generalized IMPRESSION: This study showed evidence of epileptogenicity arising from left centro- parietal region. Additionally there is moderate to severe diffuse encephalopathy. No seizures were seen throughout the recording. Priyanka O Yadav  MR BRAIN WO CONTRAST Result Date: 03/05/2024 CLINICAL DATA:  Mental status change, unknown cause EXAM: MRI HEAD WITHOUT CONTRAST TECHNIQUE: Multiplanar, multiecho pulse sequences of the brain and surrounding structures were obtained without intravenous contrast. COMPARISON:  MRI head February 17, 2024. CT head from earlier today. FINDINGS: Brain: The area of hypodensity seen on recent CT head correlates with peripheral cortical restricted diffusion and edema, compatible with acute or early subacute infarct. Susceptibility artifact and T2 hypointensity in the right temporal horn is consistent with pneumo ventricle and similar cross modalities to recent CT head. Right frontal approach EVD in place with similar size of the ventricles which remain mildly enlarged. Layering signal abnormality within the lateral ventricles is consistent with intraventricular hemorrhage which is also similar across modalities. Similar small focus of encephalomalacia in the cerebellum. Small remote lacunar infarcts in the thalami. Scattered T2/FLAIR hyperintensities in the white matter, compatible with chronic microvascular ischemic disease. Vascular: Normal flow voids. Skull and upper cervical spine: Normal marrow signal. Sinuses/Orbits: Mild paranasal sinus mucosal thickening. No acute orbital findings. IMPRESSION: 1. Left parietal acute or early subacute infarct with associated edema. No substantial mass  effect. 2. Right frontal approach EVD with similar pneumoventricle, mild ventriculomegaly, and small volume intraventricular hemorrhage. Scattered small volume of subarachnoid hemorrhage better characterized on recent CT head. 3. Small remote cerebellar and bilateral thalamic lacunar infarcts and chronic microvascular ischemic disease. Electronically Signed   By: Gilmore GORMAN Molt M.D.   On: 03/05/2024 03:05   DG Abd 1 View Result Date: 03/04/2024 CLINICAL DATA:  Encounter for feeding tube placement. EXAM: ABDOMEN - 1 VIEW COMPARISON:  None Available. FINDINGS: Feeding tube placement was performed by radiology technologist. No radiologist was involved in this exam. Four fluoroscopic spot images submitted. A 10 French Cortrak tube was placed, tip appears to be post pyloric within the distal duodenum. 20 cc of Omnipaque  300 was injected through the tube. Fluoroscopy time 2 minutes 54 seconds. Dose 13.5 mGy. IMPRESSION: Feeding tube placement with tip post pyloric in the proximal duodenum. Electronically Signed   By: Andrea Gasman M.D.   On: 03/04/2024 15:59   CT HEAD WO CONTRAST ( ) Result Date: 03/04/2024 CLINICAL DATA:  43 year old female dialysis patient with acute intracranial hemorrhage on 02/14/2024. Subarachnoid and subdural blood. External ventricular drain removed. EXAM: CT HEAD WITHOUT CONTRAST TECHNIQUE: Contiguous axial images were obtained from the base of the skull through the vertex without intravenous contrast. RADIATION DOSE REDUCTION: This exam was performed according to the departmental dose-optimization program which includes automated exposure control, adjustment of the mA and/or kV according to patient size and/or use of iterative reconstruction technique. COMPARISON:  Head CT 02/20/2024 and earlier. FINDINGS: Brain: Right temporal horn pneumo ventricle. That temporal horn mildly dilated now, but otherwise no significant change in lateral ventricle size. Fourth ventricle appears larger  on sagittal image 30. Third ventricle also slightly larger on coronal image 34. No transependymal edema. Trace residual layering IVH in the occipital horns. Gas also along the EVD tract. No midline shift. Basilar cisterns are patent. Trace residual subarachnoid blood. No convincing subdural hematoma. Heterogeneous gray and white matter in the left parietal lobe in an area of 2 cm on sagittal image 39 is new, resembles mild cerebral edema but there is no regional mass effect. Gray-white differentiation elsewhere is maintained. Small area of chronic encephalomalacia in the left cerebellar tonsil is stable. Vascular: Calcified atherosclerosis at the skull base. No suspicious intracranial vascular hyperdensity. Skull: Right superior frontal burr hole.  Otherwise intact.  Sinuses/Orbits: Left nasoenteric tube in place. Visualized paranasal sinuses and mastoids are stable and well aerated. Other: Postoperative changes to the right scalp vertex. No new orbit or scalp soft tissue finding. Calcified scalp vessel atherosclerosis. IMPRESSION: 1. Right frontal approach EVD removed with mild pneumocephalus and right temporal horn pneumo ventricle. The right temporal horn, lateral and 3rd ventricles are mildly larger since 02/20/2024. Trace residual SAH, IVH and no transependymal edema. 2. New 2 cm area of heterogeneous hypodensity in the left parietal lobe resembling cerebral edema. No regional or intracranial mass effect. 3. Stable gray-white differentiation elsewhere, including mild chronic encephalomalacia in the left inferior cerebellum. Electronically Signed   By: VEAR Hurst M.D.   On: 03/04/2024 11:35   VAS US  TRANSCRANIAL DOPPLER Result Date: 03/03/2024  Transcranial Doppler Patient Name:  JUDIE HOLLICK  Date of Exam:   03/03/2024 Medical Rec #: [id number]           Accession #:    [id number] Date of Birth: 10-27-1980            Patient Gender: F Patient Age:   22 years Exam Location:  [name] Procedure:       VAS US  TRANSCRANIAL DOPPLER Referring Phys: ARY XU --------------------------------------------------------------------------------  Indications: Subarachnoid hemorrhage. Limitations: patient positioning Comparison Study: 02/18/2024, 02/20/2024, 02/22/2024, 02/26/2024, 02/29/2024 Performing Technologist: Cordella Collet RVT  Examination Guidelines: A complete evaluation includes B-mode imaging, spectral Doppler, color Doppler, and power Doppler as needed of all accessible portions of each vessel. Bilateral testing is considered an integral part of a complete examination. Limited examinations for reoccurring indications may be performed as noted.  +----------+---------------+----------+-----------+------------------+ RIGHT TCD Right VM (cm/s)Depth (cm)Pulsatility     Comment       +----------+---------------+----------+-----------+------------------+ MCA             45                    1.12                       +----------+---------------+----------+-----------+------------------+ ACA             -37                   1.59                       +----------+---------------+----------+-----------+------------------+ Term ICA        36                    1.54                       +----------+---------------+----------+-----------+------------------+ PCA P1          24                    1.47                       +----------+---------------+----------+-----------+------------------+ Opthalmic       31                    2.01                       +----------+---------------+----------+-----------+------------------+ ICA siphon  Unable to insonate +----------+---------------+----------+-----------+------------------+ Vertebral       -15                    1.5                       +----------+---------------+----------+-----------+------------------+ Distal ICA      -10                   1.56                        +----------+---------------+----------+-----------+------------------+  +----------+--------------+----------+-----------+------------------+ LEFT TCD  Left VM (cm/s)Depth (cm)Pulsatility     Comment       +----------+--------------+----------+-----------+------------------+ MCA             25                   1.09                       +----------+--------------+----------+-----------+------------------+ ACA            -27                   2.13                       +----------+--------------+----------+-----------+------------------+ Term ICA                                     Unable to insonate +----------+--------------+----------+-----------+------------------+ PCA P1                                       Unable to insonate +----------+--------------+----------+-----------+------------------+ Opthalmic       15                   1.76                       +----------+--------------+----------+-----------+------------------+ ICA siphon      14                   1.57                       +----------+--------------+----------+-----------+------------------+ Vertebral      -18                   1.35                       +----------+--------------+----------+-----------+------------------+ Distal ICA     -23                   1.39                       +----------+--------------+----------+-----------+------------------+  +------------+-------+------------------+             VM cm/s     Comment       +------------+-------+------------------+ Prox Basilar       Unable to insonate +------------+-------+------------------+ Dist Basilar       Unable to insonate +------------+-------+------------------+ +----------------------+---+ Right Lindegaard Ratio4.5 +----------------------+---+ +---------------------+----+ Left Lindegaard Ratio1.08 +---------------------+----+  Summary: This was a normal transcranial Doppler study, with normal flow  direction and velocity  of all identified vessels of the anterior and posterior circulations, with no evidence of stenosis, vasospasm or occlusion. There was no evidence of intracranial disease. Elevated pulsatility indices suggest increased intracranial pressure or diffuse intracranial atherosclerosis *See table(s) above for TCD measurements and observations.  Diagnosing physician: Eather Popp MD Electronically signed by Eather Popp MD on 03/03/2024 at 4:19:26 PM.    Final    DG Abd 1 View Result Date: 03/03/2024 CLINICAL DATA:  Feeding tube placement. EXAM: ABDOMEN - 1 VIEW COMPARISON:  02/14/2024. FINDINGS: Lymph nasogastric tube has been removed. Feeding to appears to be coiled in the gastric antrum. IMPRESSION: Feeding tube appears coiled in the gastric antrum. Electronically Signed   By: Newell Eke M.D.   On: 03/03/2024 09:19   VAS US  TRANSCRANIAL DOPPLER Result Date: 03/01/2024  Transcranial Doppler Patient Name:  CELESTIA DUVA  Date of Exam:   02/29/2024 Medical Rec #: [id number]           Accession #:    [id number] Date of Birth: 08-04-1981            Patient Gender: F Patient Age:   9 years Exam Location:  [facility] Procedure:      VAS US  TRANSCRANIAL DOPPLER Referring Phys: ARY CUMMINS --------------------------------------------------------------------------------  Indications: SAH/ICH. History: Right frontal ventriculostomy on 02/14/2024. Comparison Study: Previous exam 02/26/24 Performing Technologist: Ezzie Potters RVT, RDMS  Examination Guidelines: A complete evaluation includes B-mode imaging, spectral Doppler, color Doppler, and power Doppler as needed of all accessible portions of each vessel. Bilateral testing is considered an integral part of a complete examination. Limited examinations for reoccurring indications may be performed as noted.  +----------+---------------+----------+-----------+------------------+ RIGHT TCD Right VM (cm/s)Depth (cm)Pulsatility     Comment        +----------+---------------+----------+-----------+------------------+ MCA             124         4.60      1.35       PSV 235 cm/s    +----------+---------------+----------+-----------+------------------+ ACA             -27                   1.33                       +----------+---------------+----------+-----------+------------------+ Term ICA        31                    1.64                       +----------+---------------+----------+-----------+------------------+ PCA P1          43                    1.25                       +----------+---------------+----------+-----------+------------------+ Opthalmic       18                    3.00                       +----------+---------------+----------+-----------+------------------+ ICA siphon  unable to insonate +----------+---------------+----------+-----------+------------------+ Vertebral       -15                   1.41                       +----------+---------------+----------+-----------+------------------+ Distal ICA      36                    1.43                       +----------+---------------+----------+-----------+------------------+  +----------+--------------+----------+-----------+------------------+ LEFT TCD  Left VM (cm/s)Depth (cm)Pulsatility     Comment       +----------+--------------+----------+-----------+------------------+ MCA             53         5.00      1.35                       +----------+--------------+----------+-----------+------------------+ ACA            -20                   1.36                       +----------+--------------+----------+-----------+------------------+ Term ICA        36                   1.28                       +----------+--------------+----------+-----------+------------------+ PCA P1          36                   1.28                        +----------+--------------+----------+-----------+------------------+ Opthalmic       18                   3.00                       +----------+--------------+----------+-----------+------------------+ ICA siphon                                   unable to insonate +----------+--------------+----------+-----------+------------------+ Vertebral      -28                   1.41                       +----------+--------------+----------+-----------+------------------+ Distal ICA      31                   1.35                       +----------+--------------+----------+-----------+------------------+  +------------+-------+------------------+             VM cm/s     Comment       +------------+-------+------------------+ Prox Basilar  -27         1.43        +------------+-------+------------------+ Dist Basilar       unable to insonate +------------+-------+------------------+ +----------------------+----+ Right Lindegaard Ratio3.44 +----------------------+----+ +---------------------+----+ Left Lindegaard Ratio1.71 +---------------------+----+  Summary:  Elevated lindegaard ratio of 3.44 and PSV of 235 cm/s indicative for moderate right side vasospasm. *See table(s) above for TCD measurements and observations.  Diagnosing physician: Eather Popp MD Electronically signed by Eather Popp MD on 03/01/2024 at 12:31:52 PM.    Final    VAS US  TRANSCRANIAL DOPPLER Result Date: 02/28/2024  Transcranial Doppler Patient Name:  DEVOTA VIRUET  Date of Exam:   02/26/2024 Medical Rec #: [id number]           Accession #:    [id number] Date of Birth: Mar 17, 1981            Patient Gender: F Patient Age:   22 years Exam Location:  [name] Procedure:      VAS US  TRANSCRANIAL DOPPLER Referring Phys: ARY XU --------------------------------------------------------------------------------  Indications: Subarachnoid hemorrhage. Performing Technologist: Ricka Sturdivant-Jones RDMS,  RVT  Examination Guidelines: A complete evaluation includes B-mode imaging, spectral Doppler, color Doppler, and power Doppler as needed of all accessible portions of each vessel. Bilateral testing is considered an integral part of a complete examination. Limited examinations for reoccurring indications may be performed as noted.  +----------+---------------+----------+-----------+-------------+ RIGHT TCD Right VM (cm/s)Depth (cm)Pulsatility   Comment    +----------+---------------+----------+-----------+-------------+ MCA             197         4.30      1.08                  +----------+---------------+----------+-----------+-------------+ ACA             -42                   0.90                  +----------+---------------+----------+-----------+-------------+ Term ICA        48                    0.96                  +----------+---------------+----------+-----------+-------------+ PCA P1          -66                   1.05         P2       +----------+---------------+----------+-----------+-------------+ Opthalmic       24                    1.67                  +----------+---------------+----------+-----------+-------------+ ICA siphon                                    not insonated +----------+---------------+----------+-----------+-------------+ Vertebral       -24                   1.08                  +----------+---------------+----------+-----------+-------------+ Distal ICA      26                    0.70                  +----------+---------------+----------+-----------+-------------+  +----------+--------------+----------+-----------+-------------+ LEFT TCD  Left VM (cm/s)Depth (cm)Pulsatility   Comment    +----------+--------------+----------+-----------+-------------+ MCA  146         5.00      1.04                  +----------+--------------+----------+-----------+-------------+ ACA            -46                    0.73                  +----------+--------------+----------+-----------+-------------+ Term ICA        43                   0.76                  +----------+--------------+----------+-----------+-------------+ PCA P1         -50                   1.03         P2       +----------+--------------+----------+-----------+-------------+ Opthalmic       27                   1.79                  +----------+--------------+----------+-----------+-------------+ ICA siphon                                   not insonated +----------+--------------+----------+-----------+-------------+ Vertebral      -21                   1.03                  +----------+--------------+----------+-----------+-------------+ Distal ICA      34                               1.03      +----------+--------------+----------+-----------+-------------+  +------------+-------+-------+             VM cm/sComment +------------+-------+-------+ Prox Basilar  -38          +------------+-------+-------+ +----------------------+---+ Right Lindegaard Ratio5.1 +----------------------+---+ +---------------------+---+ Left Lindegaard Ratio4.2 +---------------------+---+  Summary:  Elevated right middle cerebral artery mean flow velocities suggest severe right MCA and moderate left MCA vasospasm. *See table(s) above for TCD measurements and observations.  Diagnosing physician: Eather Popp MD Electronically signed by Eather Popp MD on 02/28/2024 at 7:56:45 AM.    Final    IR ANGIO VERTEBRAL SEL VERTEBRAL UNI R MOD SED PROCEDURE: Diagnostic Cerebral Angiogram  SURGEON:  Dr. Gerldine Maizes, MD  HISTORY:  The patient is a 43 y.o. yo female with a history of end-stage renal disease presenting to the [name] with subarachnoid hemorrhage.  Initial CT angiogram was negative.  Patient was not clinically able to tolerate diagnostic angiogram initially, however has improved from a neurologic standpoint.  We  will therefore proceed with diagnostic cerebral angiogram.  Unfortunately the patient has no family available to provide consent, we therefore proceed with implied emergent consent.  APPROACH:  The patient was placed in the supine position on the angiography table and the skin of right groin prepped in the usual sterile fashion. The procedure was performed under local anesthesia (1%-solution of bicarbonate-bufferred Lidoacaine) and conscious sedation with Versed  and fentanyl  monitored by the in-suite nurse and myself, including non-invasive blood pressure and continuous pulse  oxymetry.   Access to the right common femoral artery was obtained with a micropuncture needle using standard Seldinger technique.  A short 5 French sheath was placed.  HEPARIN : 0 Units total.   CONTRAST AGENT: See IR records  FLUOROSCOPY TIME: See IR records   CATHETER(S) AND WIRE(S):   5-French JB-1 glidecatheter  0.035 glidewire   VESSELS CATHETERIZED:  Right internal carotid  Left internal carotid  Right vertebral  Left vertebral  Right common femoral  VESSELS STUDIED:  Right internal carotid, head Left internal carotid, head Left vertebral Right vertebral Right femoral  PROCEDURAL NARRATIVE:  A 5-Fr JB-1 terumo glide catheter was advanced over a 0.035 glidewire into the aortic arch. The above vessels were then sequentially catheterized and cervical/cerebral angiograms taken. After review of images, the catheter was removed without incident.   INTERPRETATION:  Right internal carotid, head:  Injection reveals the presence of a widely patent ICA, M1, and A1 segments and their branches. No aneurysms, arteriovenous malformations, or high flow fistulas are visualized.  There is mild to moderate vasospasm involving the supraclinoid right internal carotid artery and both the proximal and second segments of the middle cerebral and anterior cerebral arteries.  The parenchymal and venous phases are unremarkable. The venous sinuses are widely patent.    Left internal carotid, head:  Injection reveals the presence of a widely patent ICA, A1, and M1 segments and their branches. No aneurysms, arteriovenous malformations, or high flow fistulas are visualized.  There is moderate vasospasm involving the right A1 and A2 segments, and mild-moderate vasospasm involving the M2 segments on the left.  The parenchymal and venous phases are unremarkable. The venous sinuses are widely patent.   Left vertebral:  Injection reveals the presence of a widely patent vertebral artery. This leads to a widely patent basilar artery that terminates in bilateral P1. The basilar apex is normal.  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  No aneurysms, arteriovenous malformations, or high flow fistulas are visualized.  No posterior circulation vasospasm is seen.  The parenchymal and venous phases are normal. The venous sinuses are patent.   Right vertebral:   Normal vessel. No PICA aneurysm. See basilar description above.   Right femoral:   Normal vessel. No significant atherosclerotic disease. Arterial sheath in adequate position.  DISPOSITION: Upon completion of the study, the femoral sheath was removed and hemostasis obtained using a 5-Fr Exoseal closure device. Good proximal and distal lower extremity pulses were documented upon achievement of hemostasis. The procedure was well tolerated and no early complications were observed.  The patient was transferred to the intensive care unit for further care.   IMPRESSION: 1. No intracranial aneurysms, AVM, or fistula seen to explain the patient's subarachnoid hemorrhage 2. There is relatively diffuse mild-moderate anterior circulation vasospasm as described above.    Gerldine Maizes, MD [facility]  Electronically signed by Maizes Gerldine, MD at 02/27/2024  4:03 PM   IR ANGIO INTRA EXTRACRAN SEL INTERNAL CAROTID BILAT MOD SED PROCEDURE: Diagnostic Cerebral Angiogram  SURGEON:  Dr. Gerldine Maizes, MD  HISTORY:  The patient is a 43 y.o. yo female with a history of end-stage renal disease presenting to the [name] with subarachnoid hemorrhage.  Initial CT angiogram was negative.  Patient was not clinically able to tolerate diagnostic angiogram initially, however has improved from a neurologic standpoint.  We will therefore proceed with diagnostic cerebral angiogram.  Unfortunately the patient has no family available to provide consent, we therefore proceed  with implied emergent consent.  APPROACH:  The patient was placed in the supine position on the angiography table and the skin of right groin prepped in the usual sterile fashion. The procedure was performed under local anesthesia (1%-solution of bicarbonate-bufferred Lidoacaine) and conscious sedation with Versed  and fentanyl  monitored by the in-suite nurse and myself, including non-invasive blood pressure and continuous pulse oxymetry.   Access to the right common femoral artery was obtained with a micropuncture needle using standard Seldinger technique.  A short 5 French sheath was placed.  HEPARIN : 0 Units total.   CONTRAST AGENT: See IR records  FLUOROSCOPY TIME: See IR records   CATHETER(S) AND WIRE(S):   5-French JB-1 glidecatheter  0.035 glidewire   VESSELS CATHETERIZED:  Right internal carotid  Left internal carotid  Right vertebral  Left vertebral  Right common femoral  VESSELS STUDIED:  Right internal carotid, head Left internal carotid, head Left vertebral Right vertebral Right femoral  PROCEDURAL NARRATIVE:  A 5-Fr JB-1 terumo glide catheter was advanced over a 0.035 glidewire into the aortic arch. The above vessels were then sequentially catheterized and cervical/cerebral angiograms taken. After review of images, the catheter was removed without incident.   INTERPRETATION:  Right internal carotid, head:  Injection reveals the presence of a widely patent ICA, M1, and A1 segments and their branches. No aneurysms, arteriovenous  malformations, or high flow fistulas are visualized.  There is mild to moderate vasospasm involving the supraclinoid right internal carotid artery and both the proximal and second segments of the middle cerebral and anterior cerebral arteries.  The parenchymal and venous phases are unremarkable. The venous sinuses are widely patent.   Left internal carotid, head:  Injection reveals the presence of a widely patent ICA, A1, and M1 segments and their branches. No aneurysms, arteriovenous malformations, or high flow fistulas are visualized.  There is moderate vasospasm involving the right A1 and A2 segments, and mild-moderate vasospasm involving the M2 segments on the left.  The parenchymal and venous phases are unremarkable. The venous sinuses are widely patent.   Left vertebral:  Injection reveals the presence of a widely patent vertebral artery. This leads to a widely patent basilar artery that terminates in bilateral P1. The basilar apex is normal.  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  No aneurysms, arteriovenous malformations, or high flow fistulas are visualized.  No posterior circulation vasospasm is seen.  The parenchymal and venous phases are normal. The venous sinuses are patent.   Right vertebral:   Normal vessel. No PICA aneurysm. See basilar description above.   Right femoral:   Normal vessel. No significant atherosclerotic disease. Arterial sheath in adequate position.  DISPOSITION: Upon completion of the study, the femoral sheath was removed and hemostasis obtained using a 5-Fr Exoseal closure device. Good proximal and distal lower extremity pulses were documented upon achievement of hemostasis. The procedure was well tolerated and no early complications were observed.  The patient was transferred to the intensive care unit for further care.   IMPRESSION: 1. No intracranial aneurysms, AVM, or fistula seen to explain the patient's subarachnoid hemorrhage 2. There is relatively diffuse  mild-moderate anterior circulation vasospasm as described above.    Gerldine Maizes, MD [name]  Electronically signed by Maizes Gerldine, MD at 02/27/2024  4:03 PM   VAS US  TRANSCRANIAL DOPPLER Result Date: 02/25/2024  Transcranial Doppler Patient Name:  TIMIKO OFFUTT  Date of Exam:   02/22/2024 Medical Rec #: [id number]  Accession #:    [id number] Date of Birth: Jan 11, 1981            Patient Gender: F Patient Age:   40 years Exam Location:  [name] Procedure:      VAS US  TRANSCRANIAL DOPPLER Referring Phys: ARY XU --------------------------------------------------------------------------------  Indications: Subarachnoid hemorrhage. Comparison Study: Prior TCD done 6/18 Performing Technologist: Alberta Lis RVS  Examination Guidelines: A complete evaluation includes B-mode imaging, spectral Doppler, color Doppler, and power Doppler as needed of all accessible portions of each vessel. Bilateral testing is considered an integral part of a complete examination. Limited examinations for reoccurring indications may be performed as noted.  +----------+---------------+----------+-----------+------------------+ RIGHT TCD Right VM (cm/s)Depth (cm)Pulsatility     Comment       +----------+---------------+----------+-----------+------------------+ MCA             131                   1.14                       +----------+---------------+----------+-----------+------------------+ ACA                                           Unable to insonate +----------+---------------+----------+-----------+------------------+ Term ICA        57                    1.14                       +----------+---------------+----------+-----------+------------------+ PCA P1          -32                   1.97                       +----------+---------------+----------+-----------+------------------+ Opthalmic       20                    1.45                        +----------+---------------+----------+-----------+------------------+ ICA siphon                                    Unable to insonate +----------+---------------+----------+-----------+------------------+ Vertebral                                     Unable to insonate +----------+---------------+----------+-----------+------------------+ Distal ICA      -15                   1.29                       +----------+---------------+----------+-----------+------------------+  +----------+--------------+----------+-----------+------------------+ LEFT TCD  Left VM (cm/s)Depth (cm)Pulsatility     Comment       +----------+--------------+----------+-----------+------------------+ MCA             24                   1.56                       +----------+--------------+----------+-----------+------------------+  ACA                                          Unable to insonate +----------+--------------+----------+-----------+------------------+ Term ICA        43                   1.27                       +----------+--------------+----------+-----------+------------------+ PCA P1         -27                    16                        +----------+--------------+----------+-----------+------------------+ Opthalmic       27                   1.89                       +----------+--------------+----------+-----------+------------------+ ICA siphon                                   Unable to insonate +----------+--------------+----------+-----------+------------------+ Vertebral                                    Unable to insonate +----------+--------------+----------+-----------+------------------+ Distal ICA      17                    1.2                       +----------+--------------+----------+-----------+------------------+  +------------+-------+------------------+             VM cm/s     Comment        +------------+-------+------------------+ Prox Basilar       Unable to insonate +------------+-------+------------------+ +----------------------+---+ Right Lindegaard Ratio8.7 +----------------------+---+ +---------------------+---+ Left Lindegaard Ratio1.4 +---------------------+---+  Summary:  Elevated right middle cerebral aretry mean flow velocities suggest modest vasospasm.Poor left temporal window and suboccipital windows limit evaluation *See table(s) above for TCD measurements and observations.  Diagnosing physician: Eather Popp MD Electronically signed by Eather Popp MD on 02/25/2024 at 6:53:55 AM.    Final    CT ANGIO HEAD W OR WO CONTRAST Addendum Date: 02/21/2024 ADDENDUM REPORT: 02/21/2024 13:05 ADDENDUM: Study reviewed in person with Dr. EATHER POPP on 02/21/2024 at 13:05 . Electronically Signed   By: VEAR Hurst M.D.   On: 02/21/2024 13:05   Result Date: 02/21/2024 CLINICAL DATA:  43 year old female dialysis patient with acute intracranial hemorrhage on 02/14/2024. Subarachnoid and subdural blood. Right EVD. EXAM: CT ANGIOGRAPHY HEAD WITH AND WITHOUT CONTRAST TECHNIQUE: Multidetector CT imaging of the head and neck was performed using the standard protocol during bolus administration of intravenous contrast. Multiplanar CT image reconstructions and MIPs were obtained to evaluate the vascular anatomy. Carotid stenosis measurements (when applicable) are obtained utilizing NASCET criteria, using the distal internal carotid diameter as the denominator. RADIATION DOSE REDUCTION: This exam was performed according to the departmental dose-optimization program which includes automated exposure control, adjustment of the mA and/or kV according to patient size and/or use of iterative reconstruction technique.  CONTRAST:  75mL OMNIPAQUE  IOHEXOL  350 MG/ML SOLN COMPARISON:  Head CT yesterday. Admission CTA head and neck 02/14/2024. FINDINGS: CT HEAD Brain: Stable right superior frontal approach EVD.  Stable ventricle size and configuration. Small volume residual IVH. No midline shift. Small volume residual posterior fossa intracranial hemorrhage, probably combined SAH and SDH. Stable small volume tentorial blood. 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 Supratentorial gray-white differentiation is stable. No supratentorial acute or evolving infarct identified. Calvarium and skull base: Stable right superior frontal burr hole. No acute osseous abnormality identified. Paranasal sinuses: Visualized paranasal sinuses and mastoids are stable and well aerated. Orbits: Left nasoenteric tube remains in place. Stable postoperative changes at the right vertex. Stable orbits. CTA HEAD Posterior circulation: Generalized distal vertebral artery calcified atherosclerosis. Both V3 and V4 segments appear patent. There is new moderate to severe stenosis of the distal left V4 (circumferential narrowing series 11, image 122), but remains patent to the vertebrobasilar junction. Distal right V4 segment appears stable. Right PICA origin remains patent. Left PICA origin occurs earlier, remains patent on series 11, image 145. Patent vertebrobasilar junction and basilar artery. Mild new mid basilar stenosis when compared to earlier this month (series 13, image 21). Stable SCA and PCA origins. However, PCA branches appear more diminutive and irregular, especially on the left (series 15, image 21) compared to earlier this month. But no PCA occlusion. Anterior circulation: Both ICA siphons are patent, heavily calcified as before. And there is similar new tapered appearance of the supraclinoid ICAs, carotid termini, MCA and ACA origins when compared to earlier this month (series 13, image 24). Diminutive but patent A1 and M1 segments now. Bilateral ACA and MCA branches similarly appear  diffusely diminutive but remain patent (series 15, image 19 and series 14, image 18). Venous sinuses: Early contrast timing, not evaluated. Anatomic variants: Unchanged. Review of the MIP images confirms the above findings IMPRESSION: 1. Evolving Left cerebellar infarct since 02/19/2024. No hemorrhagic transformation or mass effect. 2. CTA reveals a substantially decreased caliber of multiple intracranial arteries and bilateral circle-of-Willis branches (especially: Distal left V4, supraclinoid ICAs, MCA and ACA origins) when compared to 02/14/2024. The appearance most resembles a widespread Vasospasm, Vasoconstriction. No associated large vessel occlusion. 3. Stable EVD, ventricle size, and small volume residual intracranial hemorrhage. Electronically Signed: By: VEAR Hurst M.D. On: 02/21/2024 13:00   VAS US  TRANSCRANIAL DOPPLER Result Date: 02/21/2024  Transcranial Doppler Patient Name:  SHERICE IJAMES  Date of Exam:   02/20/2024 Medical Rec #: [id number]           Accession #:    [id number] Date of Birth: 02-10-1981            Patient Gender: F Patient Age:   23 years Exam [name] Procedure:      VAS US  TRANSCRANIAL DOPPLER Referring Phys: ARY XU --------------------------------------------------------------------------------  Indications: Subarachnoid hemorrhage. History: Large ICH with brain compression and scattered SAH. 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  Performing Technologist: Ricka Sturdivant-Jones RDMS, RVT  Examination Guidelines: A complete evaluation includes B-mode imaging, spectral Doppler, color Doppler, and power Doppler as needed of all accessible portions of each vessel. Bilateral testing is considered an integral part of a complete examination. Limited examinations for reoccurring indications may be performed as noted.  +----------+---------------+----------+-----------+-------------+ RIGHT TCD Right VM  (cm/s)Depth (cm)Pulsatility  Comment    +----------+---------------+----------+-----------+-------------+ MCA             130         5.10      1.11                  +----------+---------------+----------+-----------+-------------+ ACA             -61                   1.00                  +----------+---------------+----------+-----------+-------------+ Term ICA        40                    1.13                  +----------+---------------+----------+-----------+-------------+ PCA P1          54                    1.38                  +----------+---------------+----------+-----------+-------------+ Opthalmic       19                    1.63                  +----------+---------------+----------+-----------+-------------+ ICA siphon                                    NOT INSONATED +----------+---------------+----------+-----------+-------------+ Vertebral       -42                   1.16                  +----------+---------------+----------+-----------+-------------+ Distal ICA      33                    0.96                  +----------+---------------+----------+-----------+-------------+  +----------+--------------+----------+-----------+-------------+ LEFT TCD  Left VM (cm/s)Depth (cm)Pulsatility   Comment    +----------+--------------+----------+-----------+-------------+ MCA            110                   1.26                  +----------+--------------+----------+-----------+-------------+ ACA            -53                   1.13                  +----------+--------------+----------+-----------+-------------+ Term ICA        40                   1.34                  +----------+--------------+----------+-----------+-------------+ PCA P1          26                   1.36                  +----------+--------------+----------+-----------+-------------+ Opthalmic       19  1.33                   +----------+--------------+----------+-----------+-------------+ ICA siphon                                   NOT INSONATED +----------+--------------+----------+-----------+-------------+ Vertebral      -14                   1.33                  +----------+--------------+----------+-----------+-------------+ Distal ICA      23                                         +----------+--------------+----------+-----------+-------------+  +------------+-------+-------------+             VM cm/s   Comment    +------------+-------+-------------+ Prox Basilar       NOT INSONATED +------------+-------+-------------+ Dist Basilar       NOT INSONATED +------------+-------+-------------+ +----------------------+---+ Right Lindegaard Ratio3.9 +----------------------+---+ +---------------------+---+ Left Lindegaard Ratio4.7 +---------------------+---+  Summary:  elevated mean flow velocities in right middle cerebral artery suggestive of moderate and left middle cerebral artery suggestive of mild vasospasm.Suboptimal suboccipital window limits evaluation of posterior circulation. *See table(s) above for TCD measurements and observations.  Diagnosing physician: Eather Popp MD Electronically signed by Eather Popp MD on 02/21/2024 at 10:43:55 AM.    Final    DG Chest Port 1 View Result Date: 02/21/2024 CLINICAL DATA:  Pneumonia EXAM: PORTABLE CHEST 1 VIEW COMPARISON:  February 14, 2024 FINDINGS: Extubated. Feeding tube in the antrum of the stomach or first portion of the duodenal. Right IJ CVP line tip in the cavoatrial junction Persistent pulmonary infiltrates with interval improvement aeration of the lung may correlate with resolving pneumonia IMPRESSION: *Extubated. *Improving pneumonia. Electronically Signed   By: Franky Chard M.D.   On: 02/21/2024 07:14   CT HEAD WO CONTRAST ( ) Result Date: 02/20/2024 CLINICAL DATA:  Mental status change, unknown cause EXAM: CT HEAD WITHOUT CONTRAST  TECHNIQUE: Contiguous axial images were obtained from the base of the skull through the vertex without intravenous contrast. RADIATION DOSE REDUCTION: This exam was performed according to the departmental dose-optimization program which includes automated exposure control, adjustment of the mA and/or kV according to patient size and/or use of iterative reconstruction technique. COMPARISON:  CT head June 17, 25. FINDINGS: Brain: Stable multi compartments acute hemorrhage including intraventricular hemorrhage layering in the occipital horns, multifocal infratentorial and supratentorial subarachnoid hemorrhage similar ventricular size with right frontal approach ventriculostomy catheter. No evidence of acute large vascular territory infarct, midline shift, or mass lesion. Vascular: No hyperdense vessel. Skull: No acute fracture. Sinuses/Orbits: Clear sinuses.  No acute orbital findings. Other: No mastoid effusions. IMPRESSION: 1. Stable multi compartment acute hemorrhage. No progressive mass effect or new/interval acute hemorrhage. 2. Stable ventricular size with right frontal approach ventriculostomy catheter. Electronically Signed   By: Gilmore GORMAN Molt M.D.   On: 02/20/2024 21:04   CT HEAD WO CONTRAST ( ) Result Date: 02/19/2024 CLINICAL DATA:  Altered mental status and history of brain bleed. EXAM: CT HEAD WITHOUT CONTRAST TECHNIQUE: Contiguous axial images were obtained from the base of the skull through the vertex without intravenous contrast. RADIATION DOSE REDUCTION: This exam was performed according to the departmental dose-optimization program which includes automated exposure control, adjustment of the  mA and/or kV according to patient size and/or use of iterative reconstruction technique. COMPARISON:  CT of the head dated February 14, 2024. FINDINGS: Brain: There has been significant interval improvement of subarachnoid hemorrhage seen primarily within the prepontine cistern and left sylvian fissure.  There is minimal residual subarachnoid hemorrhage within the right sylvian fissure. There has been interval improvement of interventricular hemorrhage. There is also been significant interval improvement of hydrocephalus, although there is continued mild ballooning of the temporal horns. A right frontal approach ventriculostomy catheter is again demonstrated with its tip abutting the septum pellucidum in the midbody of the right lateral ventricle. There is no apparent intraparenchymal hemorrhage. There is diminished density present in the left paracentral pons. There is mild periventricular white matter disease. Vascular: Calcific atheromatous disease within the carotid siphons and vertebral arteries bilaterally. Skull: Right frontal approach ventriculostomy. The calvaria is otherwise intact. Sinuses/Orbits: Status post bilateral cataract surgery. The visualized paranasal sinuses are clear. Other: None. IMPRESSION: 1. Significant interval improvement of subarachnoid and intraventricular hemorrhage. 2. Interval improvement of hydrocephalus status post placement of right frontal approach ventriculostomy catheter. Electronically Signed   By: Evalene Coho M.D.   On: 02/19/2024 13:48   VAS US  TRANSCRANIAL DOPPLER Result Date: 02/19/2024  Transcranial Doppler Patient Name:  JAYLIN ROUNDY  Date of Exam:   02/18/2024 Medical Rec #: [id number]           Accession #:    [id number] Date of Birth: 09-26-1980            Patient Gender: F Patient Age:   70 years Exam Location:  [facility] Procedure:      VAS US  TRANSCRANIAL DOPPLER Referring Phys: ARY XU --------------------------------------------------------------------------------  Indications: Large ICH with brain compression and scattered SAH. 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 Presented to ED with hypertensive emergency, systolic BP in the 240s. Limitations: Positioning. Limitations for  diagnostic windows: Unable to insonate right transtemporal window. Performing Technologist: Alberta Lis RVS  Examination Guidelines: A complete evaluation includes B-mode imaging, spectral Doppler, color Doppler, and power Doppler as needed of all accessible portions of each vessel. Bilateral testing is considered an integral part of a complete examination. Limited examinations for reoccurring indications may be performed as noted.  +----------+---------------+----------+-----------+------------------+ RIGHT TCD Right VM (cm/s)Depth (cm)Pulsatility     Comment       +----------+---------------+----------+-----------+------------------+ MCA                                           unable to insonate +----------+---------------+----------+-----------+------------------+ ACA                                           unable to insonate +----------+---------------+----------+-----------+------------------+ Term ICA                                      unable to insonate +----------+---------------+----------+-----------+------------------+ PCA P1                                        unable to insonate +----------+---------------+----------+-----------+------------------+ Opthalmic  25                    2.01                       +----------+---------------+----------+-----------+------------------+ ICA siphon                                    unable to insonate +----------+---------------+----------+-----------+------------------+ Vertebral       -28                   1.81                       +----------+---------------+----------+-----------+------------------+  +----------+--------------+----------+-----------+------------------+ LEFT TCD  Left VM (cm/s)Depth (cm)Pulsatility     Comment       +----------+--------------+----------+-----------+------------------+ MCA             13                    1.3                        +----------+--------------+----------+-----------+------------------+ ACA            -50                   1.25                       +----------+--------------+----------+-----------+------------------+ Term ICA        38                   1.27                       +----------+--------------+----------+-----------+------------------+ PCA P1          25                   1.05                       +----------+--------------+----------+-----------+------------------+ Opthalmic       21                   1.38                       +----------+--------------+----------+-----------+------------------+ ICA siphon                                   unable to insonate +----------+--------------+----------+-----------+------------------+ Vertebral      -18                   1.25                       +----------+--------------+----------+-----------+------------------+ Distal ICA     -27                   1.48                       +----------+--------------+----------+-----------+------------------+  +------------+-------+-------+             VM cm/sComment +------------+-------+-------+ Prox Basilar  -22          +------------+-------+-------+ +----------------------+---+ Right Lindegaard RatioN/A +----------------------+---+ +---------------------+----+  Left Lindegaard Ratio0.48 +---------------------+----+  Summary:  Absent right temporal and poor left temporal, orbital and suboccipital windows limit evaluation.No definite veidence of vasospasm noted in identified blood vessels. *See table(s) above for TCD measurements and observations.  Diagnosing physician: Eather Popp MD Electronically signed by Eather Popp MD on 02/19/2024 at 12:23:57 PM.    Final    EEG adult Result Date: 02/18/2024 Shelton Arlin KIDD, MD     02/18/2024  7:04 PM Patient Name: Nate Perri MRN: [id number] Epilepsy Attending: Arlin KIDD Shelton Referring Physician/Provider: Judithe Rocky BROCKS,  NP Date: 02/18/2024 Duration: 24.30 mins Patient history: 43yo F with perimesencephalic SAH with IVH. EEG to evaluate for seizure. Level of alertness:  comatose AEDs during EEG study: LEV, Prednisone  Technical aspects: This EEG study was done with scalp electrodes positioned according to the 10-20 International system of electrode placement. Electrical activity was reviewed with band pass filter of 1-70Hz , sensitivity of 7 uV/mm, display speed of 11mm/sec with a 60Hz  notched filter applied as appropriate. EEG data were recorded continuously and digitally stored.  Video monitoring was available and reviewed as appropriate. Description: EEG showed continuous generalized polymorphic sharply contoured 3 to 6 Hz theta-delta slowing. Generalized periodic discharges with triphasic morphology were also noted at 1-2 Hz, with waxing and waning morphology. Hyperventilation and photic stimulation were not performed.   ABNORMALITY - Periodic discharges with triphasic morphology, generalized ( GPDs) - Continuous slow, generalized IMPRESSION: This study showed generalized periodic discharges with triphasic morphology which can be due to toxic-metabolic etiology. However, given the frequency of discharges and the waxing and waning morphology, this pattern is on the ictal-interictal continuum. Consider long term eeg if concern for ictal activity persists. Additionally there was severe diffuse encephalopathy. No definite seizures  were seen throughout the recording. Priyanka KIDD Shelton    Labs:  CBC: Recent Labs    03/15/24 0637 03/16/24 0652 03/17/24 0542 03/19/24 0448  WBC 6.6 4.2 4.6 5.4  HGB 8.6* 8.1* 8.4* 9.0*  HCT 26.8* 24.7* 25.2* 27.5*  PLT 200 184 189 247    COAGS: Recent Labs    06/13/23 1345 02/14/24 1741 02/20/24 2221  INR 1.2 1.3* 1.3*  APTT 31 28 29     BMP: Recent Labs    03/15/24 0637 03/16/24 0652 03/17/24 0542 03/19/24 0448  NA 136 135 135 132*  K 3.9 3.9 4.2 4.2  CL 94* 90* 86* 86*   CO2 34* 30 31 31   GLUCOSE 142* 117* 103* 104*  BUN 27* 41* 55* 35*  CALCIUM  9.9 10.4* 10.2 10.4*  CREATININE 3.84* 5.58* 6.96* 5.00*  GFRNONAA 14* 9* 7* 10*    LIVER FUNCTION TESTS: Recent Labs    03/15/24 0637 03/16/24 0652 03/17/24 0542 03/19/24 0448  BILITOT 0.5 0.6 0.6 0.5  AST 17 19 17 20   ALT 12 13 11 11   ALKPHOS 96 104 114 111  PROT 6.4* 6.3* 6.3* 6.3*  ALBUMIN 2.7* 2.9* 2.8* 2.8*    TUMOR MARKERS: No results for input(s): AFPTM, CEA, CA199, CHROMGRNA in the last 8760 hours.  Assessment and Plan: Per Dr. Lavonne progress note today: Oropharyngeal Dysphagia: - Mentation has improved, placed on full liquid diet however failed calorie count, has nausea vomiting.  - Dietitians recommending J-tube w/venting G-tube and she seems to be agreeable. - IR following, continue core track for now   Patient will present for scheduled gastrojejunostomy tube placement in IR tomorrow.  Patient will be NPO at midnight.  All labs and medications are currently within acceptable parameters.  No pertinent allergies.   Risks and benefits image guided gastrojejunostomy tube placement was discussed with the patient including, but not limited to the need for a barium enema during the procedure, bleeding, infection, peritonitis and/or damage to adjacent structures.  All of the patient's questions were answered, patient is agreeable to proceed.  Consent signed and at IR charge desk.     Thank you for allowing our service to participate in Safa Naseer Tench 's care.  Electronically Signed: Carlin DELENA Griffon, PA-C   03/19/2024, 2:46 PM      I spent a total of 40 Minutes in face to face in clinical consultation, greater than 50% of which was counseling/coordinating care for nausea with vomiting, colitis, gastroparesis, and malnutrition, with consideration for gastrojejunostomy tube placement for nutrition and gastric venting.

## 2024-03-19 NOTE — Progress Notes (Addendum)
 This chaplain responded to PMT NP-Eric consult for spiritual care in the setting of creating the Pt. Advance Directive. The chaplain understands from the NP, the Pt. is choosing her best friend-Cameron for HCPOA.    The chaplain received an update from the Pt. RN-Tomeka before the visit. The chaplain understands the Pt. preferred name is [name] [name].  The chaplain introduced herself and began rapport building with the Pt. The Pt. requested a revisit this afternoon.  **1415 Chaplain visit for F/U and discerning next steps with Pt. AD. The chaplain understands the Pt. friend-Cameron plans to visit on Thursday. AD education will pause until this time. The Pt. agreed to the RN paging the chaplain at the number below when [place] arrives.  This chaplain is available for F/U spiritual care as needed.  Chaplain Leeroy Hummer [phone]

## 2024-03-19 NOTE — Plan of Care (Signed)
  Problem: Clinical Measurements: Goal: Ability to maintain clinical measurements within normal limits will improve Outcome: Progressing Goal: Will remain free from infection Outcome: Progressing Goal: Diagnostic test results will improve Outcome: Progressing Goal: Respiratory complications will improve Outcome: Progressing   Problem: Activity: Goal: Risk for activity intolerance will decrease Outcome: Progressing   Problem: Nutrition: Goal: Adequate nutrition will be maintained Outcome: Progressing   Problem: Coping: Goal: Level of anxiety will decrease Outcome: Progressing   Problem: Elimination: Goal: Will not experience complications related to bowel motility Outcome: Progressing   Problem: Pain Managment: Goal: General experience of comfort will improve and/or be controlled Outcome: Progressing   Problem: Safety: Goal: Ability to remain free from injury will improve Outcome: Progressing   Problem: Skin Integrity: Goal: Risk for impaired skin integrity will decrease Outcome: Progressing   Problem: Coping: Goal: Ability to adjust to condition or change in health will improve Outcome: Progressing   Problem: Metabolic: Goal: Ability to maintain appropriate glucose levels will improve Outcome: Progressing   Problem: Nutritional: Goal: Maintenance of adequate nutrition will improve Outcome: Progressing Goal: Progress toward achieving an optimal weight will improve Outcome: Progressing   Problem: Skin Integrity: Goal: Risk for impaired skin integrity will decrease Outcome: Progressing   Problem: Education: Goal: Knowledge of secondary prevention will improve (MUST DOCUMENT ALL) Outcome: Progressing Goal: Knowledge of patient specific risk factors will improve (DELETE if not current risk factor) Outcome: Progressing   Problem: Intracerebral Hemorrhage Tissue Perfusion: Goal: Complications of Intracerebral Hemorrhage will be minimized Outcome: Progressing    Problem: Health Behavior/Discharge Planning: Goal: Goals will be collaboratively established with patient/family Outcome: Progressing   Problem: Self-Care: Goal: Ability to communicate needs accurately will improve Outcome: Progressing   Problem: Nutrition: Goal: Risk of aspiration will decrease Outcome: Progressing Goal: Dietary intake will improve Outcome: Progressing   Problem: Education: Goal: Knowledge of disease and its progression will improve Outcome: Progressing Goal: Individualized Educational Video(s) Outcome: Progressing   Problem: Fluid Volume: Goal: Compliance with measures to maintain balanced fluid volume will improve Outcome: Progressing   Problem: Health Behavior/Discharge Planning: Goal: Ability to manage health-related needs will improve Outcome: Progressing   Problem: Nutritional: Goal: Ability to make healthy dietary choices will improve Outcome: Progressing   Problem: Clinical Measurements: Goal: Complications related to the disease process, condition or treatment will be avoided or minimized Outcome: Progressing

## 2024-03-19 NOTE — Progress Notes (Signed)
 Patient ID: [name] [name], female   DOB: 07-17-1981, 43 y.o.   MRN: [id number] Subjective: The patient is alert and pleasant.  She has no complaints.  Objective: Vital signs in last 24 hours: Temp:  [97.8 F (36.6 C)-99 F (37.2 C)] 98.6 F (37 C) (07/16 0750) Pulse Rate:  [91-101] 95 (07/16 0750) Resp:  [11-18] 11 (07/16 0750) BP: (144-195)/(59-90) 144/64 (07/16 0750) SpO2:  [92 %-97 %] 95 % (07/16 0750) Weight:  [53.6 kg] 53.6 kg (07/16 0706) Estimated body mass index is 18.23 kg/m as calculated from the following:   Height as of this encounter: 5' 7.5 (1.715 m).   Weight as of this encounter: 53.6 kg.   Intake/Output from previous day: 07/15 0701 - 07/16 0700 In: 30  Out: 35 [Stool:35] Intake/Output this shift: No intake/output data recorded.  Physical exam patient is alert and oriented x 2.  She is moving all 4 extremities well.  She has bilateral cranial nerve VI palsies.  Lab Results: Recent Labs    03/17/24 0542 03/19/24 0448  WBC 4.6 5.4  HGB 8.4* 9.0*  HCT 25.2* 27.5*  PLT 189 247   BMET Recent Labs    03/17/24 0542 03/19/24 0448  NA 135 132*  K 4.2 4.2  CL 86* 86*  CO2 31 31  GLUCOSE 103* 104*  BUN 55* 35*  CREATININE 6.96* 5.00*  CALCIUM  10.2 10.4*    Studies/Results: No results found.  Assessment/Plan: Status post subarachnoid hemorrhage day #34, ventriculomegaly: Patient continues to improve clinically status post removal of ventriculostomy.  Her follow-up LP demonstrated very low pressure and no signs of infection.  I do not think she would benefit from a ventriculoperitoneal shunt.  I will sign off.  Please call if I can be of further assistance.  LOS: 34 days     [name] 03/19/2024, 8:33 AM

## 2024-03-19 NOTE — Progress Notes (Signed)
   03/19/24 1823  Vitals  Pulse Rate (!) 12  Resp (!) 108  BP (!) 176/79  O2 Device Room Air  Oxygen  Therapy  Patient Activity (if Appropriate) In bed  Pulse Oximetry Type Continuous  Oximetry Probe Site Changed No   Pt is alert and stable, 1.5 liters removed, pt c/o pain and discomfort, prn pain meds given, hemostasisx2, report given to bedisde nurse

## 2024-03-19 NOTE — Progress Notes (Signed)
 [name], is a 43 y.o. 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 Hypertensive in ED SBP 240s. CT scan suggested colitis. Patient's mentation continued to worsen. CT head was subsequently done which revealed intracranial hemorrhage. Patient was admitted to the ICU.   Significant Hospital Events:  6/11 ED w n/v AMS. Abx IVF for colitis 6/12 Admit. AMS decr LOC. Incr O2 req. PCCM consult and code stroke called seen w rapid response nephro NP then neuro  6/13 Intubated with EVD, responding to commands  6/14 Extubated 6/17 acute L cerebellar infarct. Keppra  dc  6/18 Dialysis, fevering, EVD at 10 (129 mL last 24 hrs) 6/19 Narcotic dependence a driving factor. +Vasospasm on TCD. CTA with evolving Left cerebellar infarct, no HT or mass effect, substantially decreased caliber of multiple intracranial arteries and bilateral Circle-of-Willis branches (especially: Distal left V4, supraclinoid ICAs, MCA and ACA origins), appearance c/w widespread Vasospasm, Vasoconstriction. No associated large vessel occlusion. 6/21 CSF sampline from EVD -- WBC 244 RBC 19000, cx ngtd  6/25: diagnostic angio today with nsgy - negative 6/26 evd leaking when clamped  6/30 evd removed, HD overnight, HTN improved, 7 days cefazolin  complete 7/1 more lethargic> minimal response to narcan  x2, oxy stopped, EEG neg, MRI - new small left parietal cva, cortrak placed postpyloric, vagal episodes with N/V 7/2 iHD, vimpat  started 7/9 she appears agitated.  IVs were  removed.  LP to be initiated to evaluate her opening pressure and to ensure that she does not have meningitis if this is still pending to be done. 7/10 LP done at bedside by Neurosurgery. Cortrak was removed yesterday evening and replaced by Fluoro team today.  7/11 Cortrak obstructed and reomved and had to be replaced by Flouro. Calorie Count initiated. Undergoing Dialysis today  7/12: Having chest discomfort. Palliative Care Consulted for GOC Discussion 7/14: Agreeable to J-tube with venting G-tube. IR consulted. SLP continues to recommend Thin (Full) Liquids  Assessment & Plan    Principal Problem:   SAH (subarachnoid hemorrhage) (HCC), ICH with brain compression, communicating hydrocephalus - seen by neurology and neurosurgery s/p right frontal pressure ventriculostomy via burr hole and EVD was removed on 6/30. -MRI brain showed small foci of hemorrhage in the posterior left cerebellum without evidence for acute infarct. Punctate focus of acute infarct involving the medial left occipital cortex, evolving subacute infarct in the left parietal lobe, trace blood products.  No change in management per neurology and neurosurgery. - Due to concern for hydrocephalus, neurosurgery did LP, spinal fluid pressures were low and ruled out meningitis.  Per Dr. Mavis, she  would not benefit from a VP shunt  -Palliative medicine following for GOC.   Concern for seizure activity: - Evaluated by neurology, underwent EEG.  - Continue with Vimpat , seizure precautions   Vasospasm with left cerebellar infarct:  -Seen by Neurology.  Noted to be on aspirin . -LDL 63.  HbA1c 4.2. - PT evaluation recommended SNF     Essential Hypertension: - Midodrine  has been discontinued.  - Continue amlodipine , IV hydralazine , labetalol  as needed    Chest Pain - Atypical, 2D echo showed EF normal 60 to 65%, with no regional WMA, mild LVH -Continue pain control as needed   ESRD on hemodialysis, MWF, hyperkalemia -  Management per nephrology - chronically immunosuppressed with CellCept , Cyclosporine , and Prednisone     Bradycardia:  -Secondary to vagal response. Resolved.   Abdominal Discomfort and Chest Discomfort:  - Continue pain control as needed    Oropharyngeal Dysphagia: - Mentation has improved, placed on full liquid diet however failed calorie count, has nausea vomiting.  - Dietitians recommending J-tube w/venting G-tube and she seems to be agreeable. - IR following, continue core track for now   Colitis: Stable.   Nausea/Vomiting/Gastroparesis:  - Continue supportive care    Status post Pancreatic Transplant/Diabetes mellitus  CBG (last 3)  Recent Labs    03/19/24 0339 03/19/24 0748 03/19/24 1248  GLUCAP 117* 121* 139*   Continue sensitive sliding scale insulin    Anemia of Chronic Disease H&H stable  Moderate protein calorie malnutrition, underweight Nutrition Problem: Moderate Malnutrition Etiology: chronic illness (ESRD on HD/DM) Signs/Symptoms: mild fat depletion, mild muscle depletion Interventions: Tube feeding  Estimated body mass index is 18.23 kg/m as calculated from the following:   Height as of this encounter: 5' 7.5 (1.715 m).   Weight as of this encounter: 53.6 kg.  Code Status:  DVT Prophylaxis:  heparin  injection 5,000 Units Start: 03/14/24 0600 SCDs Start: 02/14/24 0725   Level of Care: Level of care: Progressive Family Communication: Updated patient Disposition Plan:      Remains inpatient appropriate: TBD   Procedures:    Consultants:     Antimicrobials:   Anti-infectives (From admission, onward)    Start     Dose/Rate Route Frequency Ordered Stop   02/27/24 1200  vancomycin  (VANCOREADY) IVPB 500 mg/100 mL  Status:  Discontinued        500 mg 100 mL/hr over 60 Minutes Intravenous Every M-W-F (Hemodialysis) 02/25/24 1129 02/25/24 1436   02/26/24 2200  ceFAZolin  (ANCEF ) IVPB 1 g/50 mL premix        1 g 100 mL/hr over 30 Minutes  Intravenous Every 24 hours 02/26/24 1009 03/03/24 2140   02/25/24 1530  vancomycin  (VANCOREADY) IVPB 500 mg/100 mL        500 mg 100 mL/hr over 60 Minutes Intravenous Every M-W-F (Hemodialysis) 02/25/24 1436 02/25/24 1934   02/23/24 1800  meropenem  (MERREM ) 1 g in sodium chloride  0.9 % 100 mL IVPB  Status:  Discontinued        1 g 200 mL/hr over 30 Minutes Intravenous Every 24 hours 02/23/24 1625 02/26/24 1009   02/23/24 1800  vancomycin  (VANCOREADY) IVPB 1250 mg/250 mL        1,250 mg 166.7 mL/hr over 90 Minutes Intravenous  Once 02/23/24 1625 02/23/24 1933   02/17/24 2200  piperacillin -tazobactam (ZOSYN ) IVPB 2.25 g        2.25 g 100 mL/hr over 30 Minutes Intravenous Every 8 hours 02/17/24 1412 02/21/24 2336   02/14/24 1800  vancomycin  (VANCOREADY) IVPB 2000  mg/400 mL        2,000 mg 200 mL/hr over 120 Minutes Intravenous  Once 02/14/24 1709 02/14/24 2048   02/14/24 1622  piperacillin -tazobactam (ZOSYN ) IVPB 3.375 g  Status:  Discontinued        3.375 g 100 mL/hr over 30 Minutes Intravenous Every 6 hours 02/14/24 1622 02/17/24 1412   02/14/24 1545  piperacillin -tazobactam (ZOSYN ) IVPB 3.375 g  Status:  Discontinued        3.375 g 12.5 mL/hr over 240 Minutes Intravenous Every 8 hours 02/14/24 1451 02/14/24 1453   02/14/24 1545  piperacillin -tazobactam (ZOSYN ) IVPB 2.25 g  Status:  Discontinued        2.25 g 100 mL/hr over 30 Minutes Intravenous Every 8 hours 02/14/24 1453 02/14/24 1622   02/14/24 0800  cefTRIAXone  (ROCEPHIN ) 2 g in sodium chloride  0.9 % 100 mL IVPB  Status:  Discontinued        2 g 200 mL/hr over 30 Minutes Intravenous Every 24 hours 02/14/24 0730 02/14/24 1451   02/14/24 0800  metroNIDAZOLE  (FLAGYL ) IVPB 500 mg  Status:  Discontinued        500 mg 100 mL/hr over 60 Minutes Intravenous Every 12 hours 02/14/24 0730 02/14/24 1451          Medications  amLODipine   5 mg Oral Daily   aspirin   81 mg Oral Daily   Chlorhexidine  Gluconate Cloth  6 each Topical Q0600    Chlorhexidine  Gluconate Cloth  6 each Topical Q0600   cycloSPORINE   200 mg Oral QHS   cycloSPORINE   225 mg Oral Daily   [START ON 03/21/2024] darbepoetin (ARANESP ) injection - DIALYSIS  100 mcg Subcutaneous Q Fri-1800   feeding supplement  237 mL Oral BID BM   feeding supplement (NEPRO CARB STEADY)  1,000 mL Per Tube Q24H   heparin  injection (subcutaneous)  5,000 Units Subcutaneous Q8H   insulin  aspart  0-9 Units Subcutaneous Q4H   lacosamide   100 mg Oral BID   metoCLOPramide   5 mg Oral TID AC   Or   metoCLOPramide   5 mg Per Tube TID AC   multivitamin  1 tablet Oral QHS   mycophenolate   500 mg Oral BID   predniSONE   15 mg Oral Q breakfast   sevelamer  carbonate  2.4 g Oral TID WC   sodium chloride  flush  3 mL Intravenous Q12H      Subjective:   [name] [name] was seen and examined today.  Does not feel too good, continues to complain about pain, no acute nausea vomiting, shortness of breath, dizziness lightheadedness  Objective:   Vitals:   03/19/24 0750 03/19/24 0940 03/19/24 1100 03/19/24 1240  BP: (!) 144/64 (!) 144/64 (!) 178/78 (!) 178/78  Pulse: 95   93  Resp: 11   10  Temp: 98.6 F (37 C)  98.9 F (37.2 C) 98.9 F (37.2 C)  TempSrc: Oral  Oral Oral  SpO2: 95%     Weight:      Height:       No intake or output data in the 24 hours ending 03/19/24 1311   Wt Readings from Last 3 Encounters:  03/19/24 53.6 kg  11/07/23 57.3 kg  09/26/23 56.7 kg     Exam General: Alert and oriented x 3, NAD Cardiovascular: S1 S2 auscultated,  RRR Respiratory: Clear to auscultation bilaterally, no wheezing, rales Gastrointestinal: Soft, nontender, nondistended, + bowel sounds Ext: no pedal edema bilaterally Neuro: Strength 5/5 upper and lower extremities bilaterally Psych: Normal affect  Data Reviewed:  I have personally reviewed following labs    CBC Lab Results  Component Value Date   WBC 5.4 03/19/2024   RBC 2.87 (L) 03/19/2024   HGB 9.0 (L) 03/19/2024   HCT  27.5 (L) 03/19/2024   MCV 95.8 03/19/2024   MCH 31.4 03/19/2024   PLT 247 03/19/2024   MCHC 32.7 03/19/2024   RDW 15.0 03/19/2024   LYMPHSABS 1.3 03/19/2024   MONOABS 0.5 03/19/2024   EOSABS 0.1 03/19/2024   BASOSABS 0.0 03/19/2024     Last metabolic panel Lab Results  Component Value Date   NA 132 (L) 03/19/2024   K 4.2 03/19/2024   CL 86 (L) 03/19/2024   CO2 31 03/19/2024   BUN 35 (H) 03/19/2024   CREATININE 5.00 (H) 03/19/2024   GLUCOSE 104 (H) 03/19/2024   GFRNONAA 10 (L) 03/19/2024   GFRAA  02/02/2022    PATIENT IDENTIFICATION ERROR. PLEASE DISREGARD RESULTS. ACCOUNT WILL BE CREDITED.   CALCIUM  10.4 (H) 03/19/2024   PHOS 4.3 03/19/2024   PROT 6.3 (L) 03/19/2024   ALBUMIN 2.8 (L) 03/19/2024   BILITOT 0.5 03/19/2024   ALKPHOS 111 03/19/2024   AST 20 03/19/2024   ALT 11 03/19/2024   ANIONGAP 15 03/19/2024    CBG (last 3)  Recent Labs    03/19/24 0339 03/19/24 0748 03/19/24 1248  GLUCAP 117* 121* 139*      Coagulation Profile: No results for input(s): INR, PROTIME in the last 168 hours.   Radiology Studies: I have personally reviewed the imaging studies  CT ABDOMEN WO CONTRAST Result Date: 03/19/2024 CLINICAL DATA:  Evaluation of operative window for G-J tube placement in IR EXAM: CT ABDOMEN WITHOUT CONTRAST TECHNIQUE: Multidetector CT imaging of the abdomen was performed following the standard protocol without IV contrast. RADIATION DOSE REDUCTION: This exam was performed according to the departmental dose-optimization program which includes automated exposure control, adjustment of the mA and/or kV according to patient size and/or use of iterative reconstruction technique. COMPARISON:  02/13/2024 FINDINGS: Lower chest: Dependent bibasilar opacities compatible with atelectasis. No effusions. Mild cardiomegaly. Coronary artery and aortic atherosclerosis. Hepatobiliary: Small layering gallstones within the gallbladder. No biliary ductal dilatation or focal  hepatic abnormality. Pancreas: No focal abnormality or ductal dilatation. Spleen: Splenomegaly with a craniocaudal length 14.7 cm. Scattered calcifications throughout the spleen. No suspicious focal abnormality. Adrenals/Urinary Tract: Adrenal glands normal. Atrophic kidneys with diffuse renovascular calcifications. No suspicious focal renal abnormality or hydronephrosis. Stomach/Bowel: Feeding tube coils in the fundus of the stomach with the tip in the distal stomach. Stomach, visualized large and small bowel decompressed. No bowel obstruction or inflammatory process. Clear operative window to the mid/distal stomach present. Vascular/Lymphatic: Aortic atherosclerosis. No evidence of aneurysm or adenopathy. Other: No free fluid or free air. Musculoskeletal: No acute bony abnormality. IMPRESSION: Splenomegaly. Cholelithiasis.  No CT evidence of acute cholecystitis. Bilateral atrophic kidneys. Heavily calcified aorta and branch vessels. No acute findings. Electronically Signed   By: Franky Crease M.D.   On: 03/19/2024 11:43       Nikaya Nasby M.D. [name] 03/19/2024, 1:11 PM  Available via Epic secure chat 7am-7pm After 7 pm, please refer to night coverage provider listed on amion.

## 2024-03-20 ENCOUNTER — Inpatient Hospital Stay (HOSPITAL_COMMUNITY)

## 2024-03-20 ENCOUNTER — Encounter (HOSPITAL_COMMUNITY): Payer: Self-pay | Admitting: Internal Medicine

## 2024-03-20 DIAGNOSIS — I609 Nontraumatic subarachnoid hemorrhage, unspecified: Secondary | ICD-10-CM | POA: Diagnosis not present

## 2024-03-20 DIAGNOSIS — N186 End stage renal disease: Secondary | ICD-10-CM | POA: Diagnosis not present

## 2024-03-20 DIAGNOSIS — Z7189 Other specified counseling: Secondary | ICD-10-CM | POA: Diagnosis not present

## 2024-03-20 DIAGNOSIS — Z515 Encounter for palliative care: Secondary | ICD-10-CM | POA: Diagnosis not present

## 2024-03-20 DIAGNOSIS — R112 Nausea with vomiting, unspecified: Secondary | ICD-10-CM | POA: Diagnosis not present

## 2024-03-20 DIAGNOSIS — Z4659 Encounter for fitting and adjustment of other gastrointestinal appliance and device: Secondary | ICD-10-CM | POA: Diagnosis not present

## 2024-03-20 HISTORY — PX: IR GASTROSTOMY TUBE MOD SED: IMG625

## 2024-03-20 LAB — CBC
HCT: 27.2 % — ABNORMAL LOW (ref 36.0–46.0)
Hemoglobin: 8.8 g/dL — ABNORMAL LOW (ref 12.0–15.0)
MCH: 31.7 pg (ref 26.0–34.0)
MCHC: 32.4 g/dL (ref 30.0–36.0)
MCV: 97.8 fL (ref 80.0–100.0)
Platelets: 210 K/uL (ref 150–400)
RBC: 2.78 MIL/uL — ABNORMAL LOW (ref 3.87–5.11)
RDW: 15 % (ref 11.5–15.5)
WBC: 6.3 K/uL (ref 4.0–10.5)
nRBC: 0 % (ref 0.0–0.2)

## 2024-03-20 LAB — GLUCOSE, CAPILLARY
Glucose-Capillary: 108 mg/dL — ABNORMAL HIGH (ref 70–99)
Glucose-Capillary: 102 mg/dL — ABNORMAL HIGH (ref 70–99)
Glucose-Capillary: 102 mg/dL — ABNORMAL HIGH (ref 70–99)
Glucose-Capillary: 90 mg/dL (ref 70–99)
Glucose-Capillary: 85 mg/dL (ref 70–99)

## 2024-03-20 LAB — RENAL FUNCTION PANEL
Albumin: 2.6 g/dL — ABNORMAL LOW (ref 3.5–5.0)
Anion gap: 12 (ref 5–15)
BUN: 18 mg/dL (ref 6–20)
CO2: 28 mmol/L (ref 22–32)
Calcium: 9.9 mg/dL (ref 8.9–10.3)
Chloride: 93 mmol/L — ABNORMAL LOW (ref 98–111)
Creatinine, Ser: 3.52 mg/dL — ABNORMAL HIGH (ref 0.44–1.00)
GFR, Estimated: 16 mL/min — ABNORMAL LOW (ref >60)
Glucose, Bld: 95 mg/dL (ref 70–99)
Phosphorus: 3.5 mg/dL (ref 2.5–4.6)
Potassium: 3.9 mmol/L (ref 3.5–5.1)
Sodium: 133 mmol/L — ABNORMAL LOW (ref 135–145)

## 2024-03-20 LAB — PARATHYROID HORMONE, INTACT (NO CA): PTH: 103 pg/mL — ABNORMAL HIGH (ref 15–65)

## 2024-03-20 MED ORDER — CEFAZOLIN SODIUM-DEXTROSE 2-4 GM/100ML-% IV SOLN
INTRAVENOUS | Status: AC | PRN
  Administered 2024-03-20: 2 g via INTRAVENOUS

## 2024-03-20 MED ORDER — LIDOCAINE-EPINEPHRINE 1 %-1:100000 IJ SOLN
20.0000 mL | Freq: Once | INTRAMUSCULAR | Status: AC
  Administered 2024-03-20: 20 mL via INTRADERMAL

## 2024-03-20 MED ORDER — LIDOCAINE VISCOUS HCL 2 % MT SOLN
OROMUCOSAL | Status: AC
  Filled 2024-03-20: qty 15

## 2024-03-20 MED ORDER — MIDAZOLAM HCL 2 MG/2ML IJ SOLN
INTRAMUSCULAR | Status: AC | PRN
Start: 2024-03-20 — End: 2024-03-20
  Administered 2024-03-20: 1 mg via INTRAVENOUS
  Administered 2024-03-20: .5 mg via INTRAVENOUS

## 2024-03-20 MED ORDER — METOCLOPRAMIDE HCL 5 MG/ML IJ SOLN
10.0000 mg | Freq: Four times a day (QID) | INTRAMUSCULAR | Status: DC
  Administered 2024-03-20 – 2024-03-21 (×3): 10 mg via INTRAVENOUS
  Filled 2024-03-20 (×3): qty 2

## 2024-03-20 MED ORDER — LIDOCAINE-EPINEPHRINE 1 %-1:100000 IJ SOLN
INTRAMUSCULAR | Status: AC
  Filled 2024-03-20: qty 1

## 2024-03-20 MED ORDER — LIDOCAINE VISCOUS HCL 2 % MT SOLN
15.0000 mL | OROMUCOSAL | Status: AC
  Administered 2024-03-20: 10 mL via OROMUCOSAL

## 2024-03-20 MED ORDER — GLUCAGON HCL (RDNA) 1 MG IJ SOLR
INTRAMUSCULAR | Status: AC | PRN
Start: 2024-03-20 — End: 2024-03-20
  Administered 2024-03-20: .5 mg via INTRAVENOUS

## 2024-03-20 MED ORDER — IOHEXOL 300 MG/ML  SOLN
50.0000 mL | Freq: Once | INTRAMUSCULAR | Status: AC | PRN
  Administered 2024-03-20: 20 mL

## 2024-03-20 MED ORDER — FENTANYL CITRATE (PF) 100 MCG/2ML IJ SOLN
INTRAMUSCULAR | Status: AC
Start: 2024-03-20 — End: 2024-03-20
  Filled 2024-03-20: qty 2

## 2024-03-20 MED ORDER — CEFAZOLIN SODIUM-DEXTROSE 2-4 GM/100ML-% IV SOLN
INTRAVENOUS | Status: AC
Start: 2024-03-20 — End: 2024-03-20
  Filled 2024-03-20: qty 100

## 2024-03-20 MED ORDER — GLUCAGON HCL RDNA (DIAGNOSTIC) 1 MG IJ SOLR
INTRAMUSCULAR | Status: AC
Start: 2024-03-20 — End: 2024-03-20
  Filled 2024-03-20: qty 1

## 2024-03-20 MED ORDER — FENTANYL CITRATE (PF) 100 MCG/2ML IJ SOLN
INTRAMUSCULAR | Status: AC | PRN
  Administered 2024-03-20: 50 ug via INTRAVENOUS
  Administered 2024-03-20 (×2): 25 ug via INTRAVENOUS

## 2024-03-20 MED ORDER — MIDAZOLAM HCL 2 MG/2ML IJ SOLN
INTRAMUSCULAR | Status: AC
  Filled 2024-03-20: qty 2

## 2024-03-20 MED ORDER — AMLODIPINE BESYLATE 10 MG PO TABS
10.0000 mg | ORAL_TABLET | Freq: Every evening | ORAL | Status: DC
  Filled 2024-03-20: qty 1

## 2024-03-20 MED ORDER — IOHEXOL 300 MG/ML  SOLN
50.0000 mL | Freq: Once | INTRAMUSCULAR | Status: AC | PRN
  Administered 2024-03-20: 50 mL

## 2024-03-20 MED ORDER — CHLORHEXIDINE GLUCONATE CLOTH 2 % EX PADS
6.0000 | MEDICATED_PAD | Freq: Every day | CUTANEOUS | Status: DC
  Administered 2024-03-22 – 2024-03-25 (×4): 6 via TOPICAL

## 2024-03-20 NOTE — Progress Notes (Signed)
 Daily Progress Note   Date: 03/20/2024   Patient Name: [name] [name]  DOB: 12/11/1980  MRN: [id number]  Age / Sex: 43 y.o., female  Attending Physician: Davia Nydia POUR, MD Primary Care Physician: [facility] Admit Date: 02/13/2024 Length of Stay: 35 days  Reason for Consultation: Establishing goals of care  Past Medical History:  Diagnosis Date   Anemia    Anxiety    DM (diabetes mellitus), type 1 (HCC)    ESRD on hemodialysis (HCC)    Gastroparesis    History of simultaneous kidney and pancreas transplant (HCC) 2014   Hypertension    Ischemic cerebrovascular accident (CVA) (HCC) 11/2021   Narcotic abuse (HCC)    Non compliance with medical treatment    Stroke (HCC)    Vision loss     Subjective:   Subjective: Chart Reviewed. Updates received. Patient Assessed. Created space and opportunity for patient  and family to explore thoughts and feelings regarding current medical situation.  Today's Discussion: Today before meeting with the patient/family, I reviewed the chart including SLP note from today, dietitian note from today, PT note from today, IR procedure note from today.  The patient went to IR received a G-tube.  Secure chat with dietitian indicates the intent was for G-tube or J-tube extension.  They are working out logistics on how to get a J-tube extension due to her severe nausea/vomiting with gastric feedings which is relieved with postpyloric feeding.  I also reviewed labs today including CBC which shows no leukocytosis, stable hemoglobin at 8.8.  Renal function panel shows persistent but stable hyponatremia compared to yesterday at 133, improved creatinine after dialysis today at 3.52, BUN normal at 18.  In the morning I went to see the patient and she just returned from IR and her G-tube placement.  Understandably she was quite sedated I elected to not try to wake her.  I plan to come back later in the afternoon.  I will try to obtain my visit to  concur with the chaplain when we are notified when the patient's friend arrives in the effort to complete HCPOA at that time as well.  I spoke with the bedside nurse to obtain history because patient is sleeping.  She continues to have pain management needs, intermittent nausea.  Medicating as appropriate/requested.  The patient came back with a G-tube rather than G-J tube as planned, dietitian is working that out.  May require replacing core track for nutrition postpyloric.  Later in the day we had not heard from the patient's HCPOA so I went back and no family was present, patient was sleeping so elected to not wake her  I discussed situation extensively with bedside nurse, hospitalist.  Also discussed with the chaplain who is attempting to complete HCPOA documentation per patient request naming her friend Ole as the decision-maker should she be unable to make decisions.  We will continue these attempts.  Review of Systems  Unable to perform ROS   Objective:   Primary Diagnoses: Present on Admission:  (Resolved) Intractable nausea and vomiting  Colitis  Acute respiratory failure with hypoxia (HCC)  Hyperkalemia  Acute metabolic encephalopathy  Hypertensive urgency  Prolonged QT interval  Pancytopenia (HCC)  Controlled type 2 diabetes mellitus with complication, without long-term current use of insulin  (HCC)  Abnormal CT of the abdomen  Sepsis (HCC)   Vital Signs:  BP (!) 174/66 (BP Location: Left Wrist)   Pulse 99   Temp 98.6 F (37 C) (Oral)  Resp 15   Ht 5' 7.5 (1.715 m)   Wt 55.2 kg   LMP 02/13/2024 (Exact Date)   SpO2 93%   BMI 18.78 kg/m   Physical Exam Vitals and nursing note reviewed.  Constitutional:      General: She is sleeping. She is not in acute distress.    Comments: No objective signs of discomfort or distress  Cardiovascular:     Rate and Rhythm: Normal rate.  Pulmonary:     Effort: Pulmonary effort is normal. No respiratory distress.   Abdominal:     General: Abdomen is flat.  Skin:    General: Skin is warm and dry.     Palliative Assessment/Data: 10% (not tolerating any oral intake)   Existing Vynca/ACP Documentation: None  Assessment & Plan:   HPI/Patient Profile:  43 y.o. 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  She was admitted on 02/13/2024 with possible sepsis secondary to colitis, abdominal pain, nausea, vomiting, history of gastroparesis, acute respiratory failure with hypoxia secondary to volume overload history of pancreatic/renal transplant in 2015, ESRD on HD, acute metabolic encephalopathy, hypertensive urgency, and others.    She has had a very complicated admission as outlined below:   Significant Hospital Events:  6/11 ED w n/v AMS. Abx IVF for colitis 6/12 Admit. AMS decr LOC. Incr O2 req. PCCM consult and code stroke called seen w rapid response nephro NP then neuro  6/13 Intubated with EVD, responding to commands  6/14 Extubated 6/17 acute L cerebellar infarct. Keppra  dc  6/18 Dialysis, fevering, EVD at 10 (129 mL last 24 hrs) 6/19 Narcotic dependence a driving factor. +Vasospasm on TCD. CTA with evolving Left cerebellar infarct, no HT or mass effect, substantially decreased caliber of multiple intracranial arteries and bilateral Circle-of-Willis branches (especially: Distal left V4, supraclinoid ICAs, MCA and ACA origins), appearance c/w widespread Vasospasm, Vasoconstriction. No associated large vessel occlusion. 6/21 CSF sampline from EVD -- WBC 244 RBC 19000, cx ngtd  6/25: diagnostic angio today with nsgy - negative 6/26 evd leaking when clamped  6/30 evd removed, HD overnight, HTN improved, 7 days cefazolin  complete 7/1 more lethargic> minimal response to narcan  x2, oxy stopped, EEG neg, MRI - new small left parietal cva, cortrak placed postpyloric,  vagal episodes with N/V 7/2 iHD, vimpat  started 7/9 she appears agitated.  IVs were removed.  LP to be initiated to evaluate her opening pressure and to ensure that she does not have meningitis if this is still pending to be done. 7/10 LP done at bedside by Neurosurgery. Cortrak was removed yesterday evening and replaced by Fluoro team today.  7/11 Cortrak obstructed and reomved and had to be replaced by Flouro. Calorie Count initiated. Undergoing Dialysis today  7/12: Having chest discomfort. Palliative Care Consulted for GOC Discussion   Palliative medicine was consulted for GOC conversations.  SUMMARY OF RECOMMENDATIONS   Full code Full scope of care Continue attempts at HCPOA completion Palliative medicine will continue to follow and support  Symptom Management:  Per primary team PMT is available to assist as needed  Code Status: Full code  Prognosis: Unable to determine  Discharge Planning: To Be Determined  Discussed with: Medical team, nursing team, chaplain  Thank you for allowing us  to participate in the care of [name] [name] PMT will continue to support holistically.  Billing based on MDM:  Moderate  Detailed review of medical records (labs, imaging, vital signs), medically appropriate exam, discussed with treatment team, counseling and education to patient, family, & staff, documenting clinical information, medication management, coordination of care  Camellia Kays, NP Palliative Medicine Team  Team Phone # [phone] (Nights/Weekends)  05/03/2021, 8:17 AM

## 2024-03-20 NOTE — Progress Notes (Signed)
 Pt returned from IR.

## 2024-03-20 NOTE — Progress Notes (Signed)
 Was called to assist with placing postpyloric NG tube in fluoroscopy room.  Was able to bring NG tube to level of pylorus, but unsuccessful in getting past for postpyloric placement.  Fluoroscopy team will reevaluate tomorrow to retry placement. Please reach out for any further questions or assistance.   Kimble DEL Angelissa Supan PA-C 03/20/2024 5:04 PM

## 2024-03-20 NOTE — Procedures (Signed)
 Vascular and Interventional Radiology Procedure Note  Patient: [name] [name] DOB: 04-Jun-1981 Medical Record Number: [id number] Note Date/Time: 03/20/24 9:34 AM   Performing Physician: Thom Hall, MD Assistant(s): None  Diagnosis: Dysphagia  Procedure: PERCUTANEOUS GASTROSTOMY TUBE PLACEMENT  Anesthesia: Conscious Sedation Complications: None Estimated Blood Loss: Minimal  Findings:  Successful placement of a 68F gastrostomy tube under fluoroscopy.   See detailed procedure note with images in PACS. The patient tolerated the procedure well without incident or complication and was returned to Recovery in stable condition.    Thom Hall, MD [facility] Rad[facility]   Pager. [phone] Clinic. [phone]

## 2024-03-20 NOTE — Progress Notes (Signed)
 SLP Cancellation Note  Patient Details Name: [name] [name] MRN: [id number] DOB: 1981/03/25   Cancelled treatment:       Reason Eval/Treat Not Completed: Patient at procedure or test/unavailable Patient planned IR for gastrostomy tube placement today. SLP will continue to follow.    Norleen IVAR Blase, MA, CCC-SLP Speech Therapy

## 2024-03-20 NOTE — Progress Notes (Signed)
 Patient left unit for IR.CCMD aware.

## 2024-03-20 NOTE — Progress Notes (Signed)
 Upon rounding, NG/cortak tube noted already dislodged from patient's nare. Patient shared that she pulled it out because someone told her once they have the tube in her belly, the tube in her nose can be removed. Rounding team aware.

## 2024-03-20 NOTE — Plan of Care (Signed)
?  Problem: Clinical Measurements: ?Goal: Ability to maintain clinical measurements within normal limits will improve ?Outcome: Not Progressing ?  ?

## 2024-03-20 NOTE — Progress Notes (Signed)
 [name]                                                                              Rheba [name], is a 43 y.o. 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 Hypertensive in ED SBP 240s. CT scan suggested colitis. Patient's mentation continued to worsen. CT head was subsequently done which revealed intracranial hemorrhage. Patient was admitted to the ICU.   Significant Hospital Events:  6/11 ED w n/v AMS. Abx IVF for colitis 6/12 Admit. AMS decr LOC. Incr O2 req. PCCM consult and code stroke called seen w rapid response nephro NP then neuro  6/13 Intubated with EVD, responding to commands  6/14 Extubated 6/17 acute L cerebellar infarct. Keppra  dc  6/18 Dialysis, fevering, EVD at 10 (129 mL last 24 hrs) 6/19 Narcotic dependence a driving factor. +Vasospasm on TCD. CTA with evolving Left cerebellar infarct, no HT or mass effect, substantially decreased caliber of multiple intracranial arteries and bilateral [place] branches (especially: Distal left V4, supraclinoid ICAs, MCA and ACA origins), appearance c/w widespread Vasospasm, Vasoconstriction. No associated large vessel occlusion. 6/21 CSF sampline from EVD -- WBC 244 RBC 19000, cx ngtd  6/25: diagnostic angio today with nsgy - negative 6/26 evd leaking when clamped  6/30 evd removed, HD overnight, HTN improved, 7 days cefazolin  complete 7/1 more lethargic> minimal response to narcan  x2, oxy stopped, EEG neg, MRI - new small left parietal cva, cortrak placed postpyloric, vagal episodes with N/V 7/2 iHD, vimpat  started 7/9 she appears agitated.  IVs were  removed.  LP to be initiated to evaluate her opening pressure and to ensure that she does not have meningitis if this is still pending to be done. 7/10 LP done at bedside by Neurosurgery. Cortrak was removed yesterday evening and replaced by Fluoro team today.  7/11 Cortrak obstructed and reomved and had to be replaced by Flouro. Calorie Count initiated. Undergoing Dialysis today  7/12: Having chest discomfort. Palliative Care Consulted for GOC Discussion 7/14: Agreeable to J-tube with venting G-tube. IR consulted. SLP continues to recommend Thin (Full) Liquids 7/16: G-tube placed  Assessment & Plan    Principal Problem:   SAH (subarachnoid hemorrhage) (HCC), ICH with brain compression, communicating hydrocephalus - seen by neurology and neurosurgery s/p right frontal pressure ventriculostomy via burr hole and EVD was removed on 6/30. -MRI brain showed small foci of hemorrhage in the posterior left cerebellum without evidence for acute infarct. Punctate focus of acute infarct involving the medial left occipital cortex, evolving subacute infarct in the left parietal lobe, trace blood products.  No change in management per neurology and neurosurgery. - Due to concern for hydrocephalus, neurosurgery did LP, spinal fluid pressures were low and ruled out meningitis.  Per  Dr. Mavis, she would not benefit from a VP shunt  -Palliative medicine following for GOC.  Oropharyngeal Dysphagia: History of severe gastroparesis with nausea and vomiting - Mentation has improved, placed on full liquid diet however failed calorie count, has nausea vomiting due to gastroparesis.  - Dietitians recommended J-tube w/venting G-tube.  IR was consulted, G-tube was placed on 7/16.  However dietitian recommended if it can be converted to J tube due to significant gastroparesis.   - Patient still had core track this morning, which she subsequently pulled out   Concern for seizure activity: - Evaluated by neurology,  underwent EEG.  - Continue with Vimpat , seizure precautions   Vasospasm with left cerebellar infarct:  -Seen by Neurology.  Noted to be on aspirin . -LDL 63.  HbA1c 4.2. - PT evaluation recommended SNF     Essential Hypertension: - Midodrine  has been discontinued.  - Continue amlodipine , IV hydralazine , labetalol  as needed    Chest Pain - Atypical, 2D echo showed EF normal 60 to 65%, with no regional WMA, mild LVH -Continue pain control as needed   ESRD on hemodialysis, MWF, hyperkalemia - Management per nephrology - chronically immunosuppressed with CellCept , Cyclosporine , and Prednisone     Bradycardia:  -Secondary to vagal response. Resolved.   Abdominal Discomfort and Chest Discomfort:  - Continue pain control as needed     Colitis: Stable.     Status post Pancreatic Transplant/Diabetes mellitus  CBG (last 3)  Recent Labs    03/19/24 2004 03/19/24 2318 03/20/24 0500  GLUCAP 95 92 108*   Continue sensitive sliding scale insulin    Anemia of Chronic Disease H&H stable  Moderate protein calorie malnutrition, underweight Nutrition Problem: Moderate Malnutrition Etiology: chronic illness (ESRD on HD/DM) Signs/Symptoms: mild fat depletion, mild muscle depletion Interventions: Tube feeding  Estimated body mass index is 18.78 kg/m as calculated from the following:   Height as of this encounter: 5' 7.5 (1.715 m).   Weight as of this encounter: 55.2 kg.  Code Status:  DVT Prophylaxis:  heparin  injection 5,000 Units Start: 03/14/24 0600 SCDs Start: 02/14/24 0725   Level of Care: Level of care: Progressive Family Communication: Updated patient Disposition Plan:      Remains inpatient appropriate: TBD   Procedures:    Consultants:   Neurosurgery Nephrology IR  Antimicrobials:   Anti-infectives (From admission, onward)    Start     Dose/Rate Route Frequency Ordered Stop   03/20/24 0845  ceFAZolin  (ANCEF ) IVPB 2g/100 mL premix        over 30 Minutes  Intravenous Continuous PRN 03/20/24 0859 03/20/24 0845   02/27/24 1200  vancomycin  (VANCOREADY) IVPB 500 mg/100 mL  Status:  Discontinued        500 mg 100 mL/hr over 60 Minutes Intravenous Every M-W-F (Hemodialysis) 02/25/24 1129 02/25/24 1436   02/26/24 2200  ceFAZolin  (ANCEF ) IVPB 1 g/50 mL premix        1 g 100 mL/hr over 30 Minutes Intravenous Every 24 hours 02/26/24 1009 03/03/24 2140   02/25/24 1530  vancomycin  (VANCOREADY) IVPB 500 mg/100 mL        500 mg 100 mL/hr over 60 Minutes Intravenous Every M-W-F (Hemodialysis) 02/25/24 1436 02/25/24 1934   02/23/24 1800  meropenem  (MERREM ) 1 g in sodium chloride  0.9 % 100 mL IVPB  Status:  Discontinued        1 g 200 mL/hr over 30 Minutes Intravenous Every 24 hours 02/23/24 1625 02/26/24 1009   02/23/24 1800  vancomycin  (VANCOREADY) IVPB 1250 mg/250  mL        1,250 mg 166.7 mL/hr over 90 Minutes Intravenous  Once 02/23/24 1625 02/23/24 1933   02/17/24 2200  piperacillin -tazobactam (ZOSYN ) IVPB 2.25 g        2.25 g 100 mL/hr over 30 Minutes Intravenous Every 8 hours 02/17/24 1412 02/21/24 2336   02/14/24 1800  vancomycin  (VANCOREADY) IVPB 2000 mg/400 mL        2,000 mg 200 mL/hr over 120 Minutes Intravenous  Once 02/14/24 1709 02/14/24 2048   02/14/24 1622  piperacillin -tazobactam (ZOSYN ) IVPB 3.375 g  Status:  Discontinued        3.375 g 100 mL/hr over 30 Minutes Intravenous Every 6 hours 02/14/24 1622 02/17/24 1412   02/14/24 1545  piperacillin -tazobactam (ZOSYN ) IVPB 3.375 g  Status:  Discontinued        3.375 g 12.5 mL/hr over 240 Minutes Intravenous Every 8 hours 02/14/24 1451 02/14/24 1453   02/14/24 1545  piperacillin -tazobactam (ZOSYN ) IVPB 2.25 g  Status:  Discontinued        2.25 g 100 mL/hr over 30 Minutes Intravenous Every 8 hours 02/14/24 1453 02/14/24 1622   02/14/24 0800  cefTRIAXone  (ROCEPHIN ) 2 g in sodium chloride  0.9 % 100 mL IVPB  Status:  Discontinued        2 g 200 mL/hr over 30 Minutes Intravenous Every 24 hours  02/14/24 0730 02/14/24 1451   02/14/24 0800  metroNIDAZOLE  (FLAGYL ) IVPB 500 mg  Status:  Discontinued        500 mg 100 mL/hr over 60 Minutes Intravenous Every 12 hours 02/14/24 0730 02/14/24 1451          Medications  amLODipine   5 mg Oral Daily   aspirin   81 mg Oral Daily   Chlorhexidine  Gluconate Cloth  6 each Topical Q0600   Chlorhexidine  Gluconate Cloth  6 each Topical Q0600   cycloSPORINE   200 mg Oral QHS   cycloSPORINE   225 mg Oral Daily   [START ON 03/21/2024] darbepoetin (ARANESP ) injection - DIALYSIS  100 mcg Subcutaneous Q Fri-1800   feeding supplement  237 mL Oral BID BM   feeding supplement (NEPRO CARB STEADY)  1,000 mL Per Tube Q24H   heparin  injection (subcutaneous)  5,000 Units Subcutaneous Q8H   insulin  aspart  0-9 Units Subcutaneous Q4H   lacosamide   100 mg Oral BID   metoCLOPramide   5 mg Oral TID AC   Or   metoCLOPramide   5 mg Per Tube TID AC   multivitamin  1 tablet Oral QHS   mycophenolate   500 mg Oral BID   predniSONE   15 mg Oral Q breakfast   sevelamer  carbonate  2.4 g Oral TID WC   sodium chloride  flush  3 mL Intravenous Q12H      Subjective:   Aveya Chiao was seen and examined today.  Continues to complain of pain, currently no vomiting however has ongoing nausea.   Objective:   Vitals:   03/20/24 0910 03/20/24 0915 03/20/24 0928 03/20/24 0943  BP: (!) 180/90 (!) 196/90 (!) 194/78 (!) 174/66  Pulse: 96 99 98 99  Resp: 16 12 15 15   Temp:    98.6 F (37 C)  TempSrc:    Oral  SpO2: 100% 99% 95% 93%  Weight:      Height:       No intake or output data in the 24 hours ending 03/20/24 1131   Wt Readings from Last 3 Encounters:  03/19/24 55.2 kg  11/07/23 57.3 kg  09/26/23 56.7  kg    Physical Exam General: Alert and oriented x 3, uncomfortable and ill-appearing complaining of pain Cardiovascular: S1 S2 clear, RRR.  Respiratory: CTAB, no wheezing Gastrointestinal: Soft, nontender, nondistended, NBS Ext: no pedal edema bilaterally,  RUE AV fistula Neuro: no new deficits Psych: Normal affect    Data Reviewed:  I have personally reviewed following labs    CBC Lab Results  Component Value Date   WBC 6.3 03/20/2024   RBC 2.78 (L) 03/20/2024   HGB 8.8 (L) 03/20/2024   HCT 27.2 (L) 03/20/2024   MCV 97.8 03/20/2024   MCH 31.7 03/20/2024   PLT 210 03/20/2024   MCHC 32.4 03/20/2024   RDW 15.0 03/20/2024   LYMPHSABS 1.3 03/19/2024   MONOABS 0.5 03/19/2024   EOSABS 0.1 03/19/2024   BASOSABS 0.0 03/19/2024     Last metabolic panel Lab Results  Component Value Date   NA 133 (L) 03/20/2024   K 3.9 03/20/2024   CL 93 (L) 03/20/2024   CO2 28 03/20/2024   BUN 18 03/20/2024   CREATININE 3.52 (H) 03/20/2024   GLUCOSE 95 03/20/2024   GFRNONAA 16 (L) 03/20/2024   GFRAA  02/02/2022    PATIENT IDENTIFICATION ERROR. PLEASE DISREGARD RESULTS. ACCOUNT WILL BE CREDITED.   CALCIUM  9.9 03/20/2024   PHOS 3.5 03/20/2024   PROT 6.3 (L) 03/19/2024   ALBUMIN 2.6 (L) 03/20/2024   BILITOT 0.5 03/19/2024   ALKPHOS 111 03/19/2024   AST 20 03/19/2024   ALT 11 03/19/2024   ANIONGAP 12 03/20/2024    CBG (last 3)  Recent Labs    03/19/24 2004 03/19/24 2318 03/20/24 0500  GLUCAP 95 92 108*      Coagulation Profile: No results for input(s): INR, PROTIME in the last 168 hours.   Radiology Studies: I have personally reviewed the imaging studies  CT ABDOMEN WO CONTRAST Result Date: 03/19/2024 CLINICAL DATA:  Evaluation of operative window for G-J tube placement in IR EXAM: CT ABDOMEN WITHOUT CONTRAST TECHNIQUE: Multidetector CT imaging of the abdomen was performed following the standard protocol without IV contrast. RADIATION DOSE REDUCTION: This exam was performed according to the departmental dose-optimization program which includes automated exposure control, adjustment of the mA and/or kV according to patient size and/or use of iterative reconstruction technique. COMPARISON:  02/13/2024 FINDINGS: Lower chest:  Dependent bibasilar opacities compatible with atelectasis. No effusions. Mild cardiomegaly. Coronary artery and aortic atherosclerosis. Hepatobiliary: Small layering gallstones within the gallbladder. No biliary ductal dilatation or focal hepatic abnormality. Pancreas: No focal abnormality or ductal dilatation. Spleen: Splenomegaly with a craniocaudal length 14.7 cm. Scattered calcifications throughout the spleen. No suspicious focal abnormality. Adrenals/Urinary Tract: Adrenal glands normal. Atrophic kidneys with diffuse renovascular calcifications. No suspicious focal renal abnormality or hydronephrosis. Stomach/Bowel: Feeding tube coils in the fundus of the stomach with the tip in the distal stomach. Stomach, visualized large and small bowel decompressed. No bowel obstruction or inflammatory process. Clear operative window to the mid/distal stomach present. Vascular/Lymphatic: Aortic atherosclerosis. No evidence of aneurysm or adenopathy. Other: No free fluid or free air. Musculoskeletal: No acute bony abnormality. IMPRESSION: Splenomegaly. Cholelithiasis.  No CT evidence of acute cholecystitis. Bilateral atrophic kidneys. Heavily calcified aorta and branch vessels. No acute findings. Electronically Signed   By: Franky Crease M.D.   On: 03/19/2024 11:43       Kory Rains M.D. [name] 03/20/2024, 11:31 AM  Available via Epic secure chat 7am-7pm After 7 pm, please refer to night coverage provider listed on amion.

## 2024-03-20 NOTE — Progress Notes (Signed)
 Physical Therapy Treatment Patient Details Name: [name] [name] MRN: [id number] DOB: 09-06-1980 Today's Date: 03/20/2024   History of Present Illness 43 yo female who presented to the ED 6/11 with n/v, abd pain after HD. 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 CRRT 6/13- 6/15. Cerebral angiogram 6/25 shows no intracranial aneurysms, AVM, or fistula seen to explain the patient's subarachnoid hemorrhage, diffuse mild-moderate anterior circulation vasospasm. EVD 6/12-6/30.  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 LP 7/10- negative. 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    PT Comments  Patient received pain medication 45 minutes prior to start of session, yet insisting she is due more pain meds at this time. RN confirmed she is not. Max encouragement to participate with OOB. Rolling and side to sit with min assist and incr time due to poor effort. Able to sit EOB with CGA and discuss pain medications for ~3 minutes and then she began to lean posterior expecting PT to catch her. Informed she was going to hit her head on the bed rail and was able to pull herself back to upright (using abdominal muscles). Then began leaning to her right and required max cues and min assist to right herself. After several minutes of sitting balance with leaning rt vs left and returning to midline, pt finally agreed to stand and pivot to her chair. Set up with chair alarm and for comfort. Pt asking to return to bed and encouraged to sit up for at least an hour. RN and NT made aware of pt's abilities and location.     If plan is discharge home, recommend the following: Assistance with cooking/housework;Assistance with feeding;Direct supervision/assist for medications management;Direct supervision/assist for financial management;Assist for transportation;Help  with stairs or ramp for entrance;Supervision due to cognitive status;A lot of help with bathing/dressing/bathroom;Two people to help with walking and/or transfers   Can travel by private vehicle     No  Equipment Recommendations  Wheelchair cushion (measurements PT);Wheelchair (measurements PT);Hospital bed;Hoyer lift    Recommendations for Other Services       Precautions / Restrictions Precautions Precautions: Fall Recall of Precautions/Restrictions: Impaired Precaution/Restrictions Comments: PEG, flexiseal Restrictions Weight Bearing Restrictions Per Provider Order: No     Mobility  Bed Mobility Overal bed mobility: Needs Assistance Bed Mobility: Rolling, Sidelying to Sit Rolling: Min assist Sidelying to sit: Min assist, Used rails       General bed mobility comments: once on her side, she moved legs over EOB herself and initiated pushing up with UEs; assist to raise torso    Transfers Overall transfer level: Needs assistance Equipment used: 1 person hand held assist Transfers: Sit to/from Stand, Bed to chair/wheelchair/BSC Sit to Stand: Min assist Stand pivot transfers: Min assist         General transfer comment: pt declined further STS once in the chair due to pain and nausea    Ambulation/Gait                   Stairs             Wheelchair Mobility     Tilt Bed    Modified Rankin (Stroke Patients Only) Modified Rankin (Stroke Patients Only) Pre-Morbid Rankin Score: No symptoms Modified Rankin: Moderately severe disability     Balance Overall balance assessment: Needs assistance Sitting-balance support: Feet supported, Bilateral upper extremity supported Sitting balance-Leahy Scale: Poor  Sitting balance - Comments: CGA-mod A for sitting balance; pt varies leaning posterior, rt, or anterior   Standing balance support: During functional activity, Single extremity supported Standing balance-Leahy Scale: Poor Standing balance  comment: only tolerated standing for ~10 seconds with forward flexed posture                            Communication Communication Communication: Impaired Factors Affecting Communication: Reduced clarity of speech  Cognition Arousal: Alert Behavior During Therapy: Anxious   PT - Cognitive impairments: Attention, Awareness, Problem solving, Safety/Judgement, Initiation                       PT - Cognition Comments: pt A&Ox3, to self, location, and situation. Time NT Following commands: Impaired Following commands impaired: Only follows one step commands consistently    Cueing Cueing Techniques: Verbal cues, Gestural cues  Exercises Other Exercises Other Exercises: seated balance intervention: finding upright with truncal facilitation    General Comments        Pertinent Vitals/Pain Pain Assessment Pain Assessment: Faces Faces Pain Scale: Hurts even more Pain Location: abd; fixated on asking for Dilaudid  despite having 45 min prior Pain Descriptors / Indicators: Discomfort, Grimacing Pain Intervention(s): Limited activity within patient's tolerance, Monitored during session, Premedicated before session    Home Living                          Prior Function            PT Goals (current goals can now be found in the care plan section) Acute Rehab PT Goals Patient Stated Goal: to rest PT Goal Formulation: With patient Time For Goal Achievement: 04/03/24 Potential to Achieve Goals: Poor Progress towards PT goals: Not progressing toward goals - comment (refuses to attempt ambulation or ther-ex)    Frequency    Min 1X/week      PT Plan      Co-evaluation              AM-PAC PT 6 Clicks Mobility   Outcome Measure  Help needed turning from your back to your side while in a flat bed without using bedrails?: A Little Help needed moving from lying on your back to sitting on the side of a flat bed without using bedrails?: A  Lot Help needed moving to and from a bed to a chair (including a wheelchair)?: A Little Help needed standing up from a chair using your arms (e.g., wheelchair or bedside chair)?: A Little Help needed to walk in hospital room?: Total Help needed climbing 3-5 steps with a railing? : Total 6 Click Score: 13    End of Session Equipment Utilized During Treatment: Gait belt Activity Tolerance: Patient limited by pain Patient left: with call bell/phone within reach;in chair;with chair alarm set Nurse Communication: Mobility status PT Visit Diagnosis: Muscle weakness (generalized) (M62.81);Other symptoms and signs involving the nervous system (R29.898) Hemiplegia - Right/Left: Left Hemiplegia - dominant/non-dominant: Non-dominant Hemiplegia - caused by: Other Nontraumatic intracranial hemorrhage     Time: 1157-1221 PT Time Calculation (min) (ACUTE ONLY): 24 min  Charges:    $Therapeutic Activity: 23-37 mins PT General Charges $$ ACUTE PT VISIT: 1 Visit                      Macario RAMAN, PT Acute Rehabilitation Services  Office [phone]    Macario SQUIBB  Kynzlie Hilleary 03/20/2024, 12:35 PM

## 2024-03-20 NOTE — Progress Notes (Signed)
 PT Cancellation Note  Patient Details Name: [name] [name] MRN: [id number] DOB: 12-08-1980   Cancelled Treatment:    Reason Eval/Treat Not Completed: Patient at procedure or test/unavailable  Patient off the floor for procedure.    Macario RAMAN, PT Acute Rehabilitation Services  Office [phone]  Macario SHAUNNA Soja 03/20/2024, 9:30 AM

## 2024-03-20 NOTE — Progress Notes (Addendum)
 Nutrition Follow-up  DOCUMENTATION CODES:   Non-severe (moderate) malnutrition in context of chronic illness  INTERVENTION:   When enteral access is obtained: Nepro at 45 ml/hr (1,080 ml per day)    Provides 1,911 kcal (100% of calorie needs), 87 gm protein (100% protein needs) , 785 ml free water  daily   Encourage PO intake if diet advanced House trays for now  Continue extra servings of soup on meal trays to increase intake  Ensure Plus High Protein po BID, each supplement provides 350 kcal and 20 grams of protein  *Nepro is not ideal with pt's with gastroparesis due to it's high fat and fiber content, especially if it is gastric. Rena-Vite daily  If pt cannot tolerate a FLD, recommend converting PEG tube into G-J tube (G-tube for venting and J-tube for feeds) after 4 weeks period set by radiology   NUTRITION DIAGNOSIS:   Moderate Malnutrition related to chronic illness (ESRD on HD/DM) as evidenced by mild fat depletion, mild muscle depletion. 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  6/11 - ED for abx for colitis 6/12 - admit for ICH; intubated with EVD; started CRRT 6/13 - s/p cortrak placement; tip gastric 6/14 - extubated  6/15 - CRRT stopped 6/16 - began iHD treatment regimen MWF 6/27- SLP therapy, allowing floor snacks of thin liquids/purees but remained NPO 6/28- recorded emesis episode, tube feeds stopped 6/30 -EVD removed. HD overnight  7/1 - s/p post pyloric cortrak placement, New CVA  7/2 - Trickle tube feeds resumed, HD AM  7/3 - Switched to VITAL 1.5, titrate tube feeds to goal   7/9 - Swicthed to [facility] d/t elevated k, cortrak coiled in stomach, emesis, pt pulled cortrak feeding tube out, FLD 7/10 - Consult for IR to replace post pyloric feeding tube, calorie count started, s/p lumbar puncture 7/11 - Post pyloric cortrak replaced, night RN pulled tube out because it was clogged 7/12 - 48 hour calorie count, meeting <20% of estimated energy needs 7/17 - Pt went down to radiology for G-J however PEG tube was placed, NGT pulled, pt has no safe enteral access, NPO  Pt went down to radiology for G-J however PEG tube was placed.   Unfortunately, this pt has not been able to tolerate gastric tube feeds since admission. Her NGT was advanced to post pyloric which she tolerated. Pt has severe gastroparesis and complains of nausea almost 24 hours per day and has had multiple prior episodes of emesis/gagging, when taken anything gastrically. MD in agreement pt needed a G-J tube (G-tube for venting with a J-tube for feeds.) Palliative was consulted and pt consented for procedure. Order was placed for radiology to place a G-J tube however a PEG tube was placed instead. Per radiology it will be 4 weeks until tube can be converted to G-J. Radiology also retracted pt's post pyloric NGT to 66 cm. Shortly after procedure pt pulled out her NGT. Pt is currently NPO and throws up anything given gastrically. Tube feeds and medications on hold, made MD aware.   Per radiology fluoroscopy can place a small bore tube through  the G tube or replace post pyloric NGT through the nasal cavity. Cortrak has been unsuccessful placing post pyloric multiple times.   Pt has been able to tolerate small amounts of soup but refuses anything else on her meal tray and all ONS as she is scared to eat because she does not want to throw up. Pt has been NPO and tube feeds stopped since midnight.  Admit weight: 59.3 kg Current weight: 55.2 kg   Average Meal Intake: 7/11: 50% soup x 3 meals  7/17: 0% x 2 meals     Intake/Output Summary (Last 24 hours) at 03/20/2024 1425 Last data filed at 03/20/2024 1333 Gross per 24 hour  Intake 0 ml  Output --  Net 0 ml   Drains/Lines: FMS removed  No UOP PEG  Nutritionally Relevant Medications: Scheduled Meds:  amLODipine   10 mg Oral QPM   aspirin   81 mg Oral Daily   Chlorhexidine  Gluconate Cloth  6 each Topical Q0600   Chlorhexidine  Gluconate Cloth  6 each Topical Q0600   cycloSPORINE   200 mg Oral QHS   cycloSPORINE   225 mg Oral Daily   [START ON 03/21/2024] darbepoetin (ARANESP ) injection - DIALYSIS  100 mcg Subcutaneous Q Fri-1800   feeding supplement  237 mL Oral BID BM   feeding supplement (NEPRO CARB STEADY)  1,000 mL Per Tube Q24H   heparin  injection (subcutaneous)  5,000 Units Subcutaneous Q8H   insulin  aspart  0-9 Units Subcutaneous Q4H   lacosamide   100 mg Oral BID   metoCLOPramide   5 mg Oral TID AC   Or   metoCLOPramide   5 mg Per Tube TID AC   multivitamin  1 tablet Oral QHS   mycophenolate   500 mg Oral BID   predniSONE   15 mg Oral Q breakfast   sevelamer  carbonate  2.4 g Oral TID WC   sodium chloride  flush  3 mL Intravenous Q12H   Labs Reviewed: Sodium 133 Potassium 5.8 BUN 45 Creatinine 3.52 Magnesium  3.0 Phosphorus 3.5 Albumin 2.6 Total protein 6.3 GFR 16 TG 303 CBG ranges from 92-121 mg/dL over the last 24 hours HgbA1c 4.2   Diet Order:   Diet Order             Diet NPO time specified Except for: Sips with Meds  Diet effective midnight                   EDUCATION NEEDS:   No education needs have been identified at this time  Skin:  Skin Assessment: Reviewed RN Assessment  Last BM:  7/16. 35 ml via FMS  Height:   Ht Readings from Last 1 Encounters:  02/14/24 5' 7.5 (1.715 m)    Weight:   Wt Readings from Last 1 Encounters:  03/19/24 55.2 kg    Ideal Body Weight:  61.4 kg  BMI:  Body mass index is 18.78 kg/m.  Estimated Nutritional Needs:   Kcal:  1800-2000  Protein:  75-90  grams  Fluid:  1L + UOP  Olivia Kenning, RD Registered Dietitian  See Amion for more information

## 2024-03-20 NOTE — Progress Notes (Signed)
 [facility] Progress Note  Subjective:  Last HD on 7/16 with 1.5 kg UF.  Still struggling with pain control.     Review of systems:    Denies shortness of breath  She has some abdominal pain around the site of her new PEG tube  Some nausea   Vitals:   03/20/24 0915 03/20/24 0928 03/20/24 0943 03/20/24 1205  BP: (!) 196/90 (!) 194/78 (!) 174/66 (!) 181/81  Pulse: 99 98 99 (!) 104  Resp: 12 15 15 14   Temp:   98.6 F (37 C) 99 F (37.2 C)  TempSrc:   Oral Oral  SpO2: 99% 95% 93% 96%  Weight:      Height:        Physical Exam:      General adult female in bed in NAD HEENT normocephalic atraumatic extraocular movements intact sclera anicteric Neck supple trachea midline Lungs clear to auscultation bilaterally normal work of breathing at rest; on room air Heart S1S2 no rub Abdomen soft nontender nondistended Extremities no edema  Psych normal mood and affect Access RUE AVF with bruit and thrill; aneurysmal region    OP HD:  MWF AF 3h  B400   57.2kg  2K   AVF  Heparin  none - Hectoral 4mcg IV q HD - Mircera 150mcg IV q 2 weeks (last given 6/9 -> due 6/23)   Assessment/Plan: Intracerebral hemorrhage: C/b hydrocephalus, 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 TCDs with evidence of vasospasm 02/11/24. Followed closely by neurosurgery, evd removed 6/30 CVA: New CVA found on 7/1 ESRD:  HD per MWF schedule.   S/p CRRT 6/12-6/15, then back on iHD.   Per charting, pain meds were ordered prn for during HD to try and keep her from signing off early.   Hyperkalemia: resolved w/ TF changed to Nepro Volume: no vol excess on exam. Note she is below her EDW.  HTN: optimize volume with HD. Started back amlodipine  at 5 mg daily on 7/15 - increase to 10 mg nightly for 7/17 Anemia of ESRD: increased aranesp  to 100 mcg every Friday. Iron  adequate Secondary HPTH: Ca and Phos high, VDRA stopped 6/21, added sevelamer  via tube. updated intact PTH is pending. Could  consider low-dose Sensipar  if needed.   Pancreas/ kidney transplant (2014): for the functioning pancreas pt is getting her home dosing of CyA 225mg  qam and 200 mg qpm + Cellcept  and prednisone . CyA level 7/09 was 109 (goal 100-200) which is in range.     Disposition - per primary team    Recent Labs  Lab 03/19/24 0448 03/20/24 0540  HGB 9.0* 8.8*  ALBUMIN 2.8* 2.6*  CALCIUM  10.4* 9.9  PHOS 4.3 3.5  CREATININE 5.00* 3.52*  K 4.2 3.9   No results for input(s): IRON , TIBC, FERRITIN in the last 168 hours. Inpatient medications:  amLODipine   5 mg Oral Daily   aspirin   81 mg Oral Daily   Chlorhexidine  Gluconate Cloth  6 each Topical Q0600   Chlorhexidine  Gluconate Cloth  6 each Topical Q0600   cycloSPORINE   200 mg Oral QHS   cycloSPORINE   225 mg Oral Daily   [START ON 03/21/2024] darbepoetin (ARANESP ) injection - DIALYSIS  100 mcg Subcutaneous Q Fri-1800   feeding supplement  237 mL Oral BID BM   feeding supplement (NEPRO CARB STEADY)  1,000 mL Per Tube Q24H   heparin  injection (subcutaneous)  5,000 Units Subcutaneous Q8H   insulin  aspart  0-9 Units Subcutaneous Q4H   lacosamide   100 mg Oral  BID   metoCLOPramide   5 mg Oral TID AC   Or   metoCLOPramide   5 mg Per Tube TID AC   multivitamin  1 tablet Oral QHS   mycophenolate   500 mg Oral BID   predniSONE   15 mg Oral Q breakfast   sevelamer  carbonate  2.4 g Oral TID WC   sodium chloride  flush  3 mL Intravenous Q12H     acetaminophen  **OR** acetaminophen , albuterol , artificial tears, atropine , butalbital -acetaminophen -caffeine , Gerhardt's butt cream, hydrALAZINE , HYDROmorphone  (DILAUDID ) injection, iohexol , labetalol , loperamide  HCl, naLOXone  (NARCAN )  injection, nitroGLYCERIN , ondansetron  (ZOFRAN ) IV, mouth rinse, prochlorperazine , simethicone     [name] JAYSON Saba, MD 1:26 PM 03/20/2024

## 2024-03-20 NOTE — Progress Notes (Signed)
 Report from procedural RN that pt w/new G-tube, not J tube. Meds can be given now and feeding can be admin 4 hours later.   Per RD, pt with hx of gastroparesis and preferable route of nutrition is J tube. RD requested to have medication held until pt's access for TF is established. Current NG tube is at 66cm last at 93cm most likely not postpyloric).

## 2024-03-21 ENCOUNTER — Inpatient Hospital Stay (HOSPITAL_COMMUNITY)

## 2024-03-21 DIAGNOSIS — Z7189 Other specified counseling: Secondary | ICD-10-CM | POA: Diagnosis not present

## 2024-03-21 DIAGNOSIS — Z4659 Encounter for fitting and adjustment of other gastrointestinal appliance and device: Secondary | ICD-10-CM | POA: Diagnosis not present

## 2024-03-21 DIAGNOSIS — I609 Nontraumatic subarachnoid hemorrhage, unspecified: Secondary | ICD-10-CM | POA: Diagnosis not present

## 2024-03-21 DIAGNOSIS — N186 End stage renal disease: Secondary | ICD-10-CM | POA: Diagnosis not present

## 2024-03-21 DIAGNOSIS — R112 Nausea with vomiting, unspecified: Secondary | ICD-10-CM | POA: Diagnosis not present

## 2024-03-21 DIAGNOSIS — Z515 Encounter for palliative care: Secondary | ICD-10-CM | POA: Diagnosis not present

## 2024-03-21 LAB — CBC
HCT: 27.8 % — ABNORMAL LOW (ref 36.0–46.0)
Hemoglobin: 9 g/dL — ABNORMAL LOW (ref 12.0–15.0)
MCH: 31.4 pg (ref 26.0–34.0)
MCHC: 32.4 g/dL (ref 30.0–36.0)
MCV: 96.9 fL (ref 80.0–100.0)
Platelets: 207 K/uL (ref 150–400)
RBC: 2.87 MIL/uL — ABNORMAL LOW (ref 3.87–5.11)
RDW: 15.3 % (ref 11.5–15.5)
WBC: 5.1 K/uL (ref 4.0–10.5)
nRBC: 0 % (ref 0.0–0.2)

## 2024-03-21 LAB — GLUCOSE, CAPILLARY
Glucose-Capillary: 92 mg/dL (ref 70–99)
Glucose-Capillary: 98 mg/dL (ref 70–99)
Glucose-Capillary: 98 mg/dL (ref 70–99)
Glucose-Capillary: 96 mg/dL (ref 70–99)
Glucose-Capillary: 90 mg/dL (ref 70–99)
Glucose-Capillary: 96 mg/dL (ref 70–99)

## 2024-03-21 LAB — RENAL FUNCTION PANEL
Albumin: 2.6 g/dL — ABNORMAL LOW (ref 3.5–5.0)
Anion gap: 17 — ABNORMAL HIGH (ref 5–15)
BUN: 27 mg/dL — ABNORMAL HIGH (ref 6–20)
CO2: 23 mmol/L (ref 22–32)
Calcium: 10 mg/dL (ref 8.9–10.3)
Chloride: 94 mmol/L — ABNORMAL LOW (ref 98–111)
Creatinine, Ser: 5.22 mg/dL — ABNORMAL HIGH (ref 0.44–1.00)
GFR, Estimated: 10 mL/min — ABNORMAL LOW (ref >60)
Glucose, Bld: 92 mg/dL (ref 70–99)
Phosphorus: 4.6 mg/dL (ref 2.5–4.6)
Potassium: 4.2 mmol/L (ref 3.5–5.1)
Sodium: 134 mmol/L — ABNORMAL LOW (ref 135–145)

## 2024-03-21 LAB — HEPATITIS B SURFACE ANTIBODY,QUALITATIVE: Hep B S Ab: REACTIVE — AB

## 2024-03-21 LAB — HEPATITIS B SURFACE ANTIGEN: Hepatitis B Surface Ag: NONREACTIVE

## 2024-03-21 MED ORDER — PANCRELIPASE (LIP-PROT-AMYL) 10440-39150 UNITS PO TABS
20880.0000 [IU] | ORAL_TABLET | Freq: Once | ORAL | Status: AC
  Administered 2024-03-21: 20880 [IU]
  Filled 2024-03-21: qty 2

## 2024-03-21 MED ORDER — METOCLOPRAMIDE HCL 5 MG/ML IJ SOLN
5.0000 mg | Freq: Three times a day (TID) | INTRAMUSCULAR | Status: DC
  Administered 2024-03-21 – 2024-03-27 (×17): 5 mg via INTRAVENOUS
  Filled 2024-03-21 (×18): qty 2

## 2024-03-21 MED ORDER — SODIUM BICARBONATE 650 MG PO TABS
650.0000 mg | ORAL_TABLET | Freq: Once | ORAL | Status: AC
  Administered 2024-03-21: 650 mg
  Filled 2024-03-21: qty 1

## 2024-03-21 MED ORDER — IOHEXOL 300 MG/ML  SOLN
50.0000 mL | Freq: Once | INTRAMUSCULAR | Status: AC | PRN
  Administered 2024-03-21: 50 mL

## 2024-03-21 MED ORDER — NEPRO/CARBSTEADY PO LIQD
1000.0000 mL | ORAL | Status: DC
  Administered 2024-03-21: 1000 mL

## 2024-03-21 MED ORDER — SODIUM CHLORIDE 0.9 % IV SOLN
100.0000 mg | Freq: Once | INTRAVENOUS | Status: AC
  Administered 2024-03-21: 100 mg via INTRAVENOUS
  Filled 2024-03-21: qty 10

## 2024-03-21 MED ORDER — LIDOCAINE VISCOUS HCL 2 % MT SOLN
15.0000 mL | Freq: Once | OROMUCOSAL | Status: AC
  Administered 2024-03-21: 15 mL via OROMUCOSAL

## 2024-03-21 NOTE — Progress Notes (Signed)
 [facility] Progress Note  Subjective:  Last HD on 7/16 with 1.5 kg UF per HD RN chart note.  Spoke with RN and they state that her new PEG tube is not in place so they have been told not to use it for meds.  She states they are working on enteric access for meds.    Review of systems:     Denies shortness of breath  She has some abdominal pain around the site of her new PEG tube  No nausea today yet    Vitals:   03/20/24 1743 03/20/24 1950 03/20/24 2305 03/21/24 0310  BP: (!) 152/66 (!) 183/75 (!) 178/66 (!) 193/59  Pulse:   (!) 108 (!) 117  Resp:  13 16 17   Temp:  98.8 F (37.1 C) 99.3 F (37.4 C) 97.8 F (36.6 C)  TempSrc:  Oral Oral Oral  SpO2:  97% 97% 94%  Weight:      Height:        Physical Exam:       General adult female in bed in NAD but uncomfortable  HEENT normocephalic atraumatic extraocular movements intact sclera anicteric Neck supple trachea midline Lungs clear to auscultation bilaterally normal work of breathing at rest; on room air Heart S1S2 tachycardic no rub Abdomen soft nontender nondistended Extremities no edema  Psych normal mood and affect Access RUE AVF with bruit and thrill; aneurysmal region    OP HD:  MWF AF 3h  B400   57.2kg  2K   AVF  Heparin  none - Hectoral 4mcg IV q HD - Mircera 150mcg IV q 2 weeks (last given 6/9 -> due 6/23)   Assessment/Plan: Intracerebral hemorrhage: C/b hydrocephalus, 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 TCDs with evidence of vasospasm 02/11/24. Followed closely by neurosurgery, evd removed 6/30 CVA: New CVA found on 7/1 ESRD:  HD per MWF schedule.   S/p CRRT 6/12-6/15, then back on iHD.     Hyperkalemia: resolved w/ TF changed to Nepro Volume: no vol excess on exam. Note she is below her EDW.  HTN:  optimize volume with HD.  Started back amlodipine  at 5 mg daily on 7/15 and increased to 10 mg nightly on 7/17 (evening dosing preferred due to variable HD schedule).  Note that her  amlodipine  was not given on 7/17 as no enteric access   Anemia of ESRD: increased aranesp  to 100 mcg every Friday. Iron  adequate Secondary HPTH: Ca and Phos high, VDRA stopped 6/21, added sevelamer  via tube. updated intact PTH is pending. Could consider low-dose Sensipar  if needed.   Pancreas/ kidney transplant (2014): for the functioning pancreas pt is getting her home dosing of CyA 225mg  qam and 200 mg qpm + Cellcept  and prednisone . CyA level 7/09 was 109 (goal 100-200) which is in range.   Team is getting enteric access again - transplant meds would be essential   Disposition - per primary team    Recent Labs  Lab 03/20/24 0540 03/21/24 0412  HGB 8.8* 9.0*  ALBUMIN 2.6* 2.6*  CALCIUM  9.9 10.0  PHOS 3.5 4.6  CREATININE 3.52* 5.22*  K 3.9 4.2   No results for input(s): IRON , TIBC, FERRITIN in the last 168 hours. Inpatient medications:  amLODipine   10 mg Oral QPM   aspirin   81 mg Oral Daily   Chlorhexidine  Gluconate Cloth  6 each Topical Q0600   Chlorhexidine  Gluconate Cloth  6 each Topical Q0600   Chlorhexidine  Gluconate Cloth  6 each Topical Q0600   cycloSPORINE   200 mg Oral QHS   cycloSPORINE   225 mg Oral Daily   darbepoetin (ARANESP ) injection - DIALYSIS  100 mcg Subcutaneous Q Fri-1800   feeding supplement  237 mL Oral BID BM   feeding supplement (NEPRO CARB STEADY)  1,000 mL Per Tube Q24H   heparin  injection (subcutaneous)  5,000 Units Subcutaneous Q8H   insulin  aspart  0-9 Units Subcutaneous Q4H   lacosamide   100 mg Oral BID   metoCLOPramide  (REGLAN ) injection  10 mg Intravenous Q6H   multivitamin  1 tablet Oral QHS   mycophenolate   500 mg Oral BID   predniSONE   15 mg Oral Q breakfast   sevelamer  carbonate  2.4 g Oral TID WC   sodium chloride  flush  3 mL Intravenous Q12H     acetaminophen  **OR** acetaminophen , albuterol , artificial tears, atropine , butalbital -acetaminophen -caffeine , Gerhardt's butt cream, hydrALAZINE , HYDROmorphone  (DILAUDID ) injection,  iohexol , labetalol , loperamide  HCl, naLOXone  (NARCAN )  injection, nitroGLYCERIN , ondansetron  (ZOFRAN ) IV, mouth rinse, prochlorperazine , simethicone     Katheryn JAYSON Saba, MD 8:29 AM 03/21/2024

## 2024-03-21 NOTE — Progress Notes (Signed)
 Re-attempted post-pyloric NG tube placement today with successful placement in the lower descending duodenum. NG tube secured in place, patient instructed not to remove.  Please reference the imaging section in Epic for the full dictation.  Kimble DEL Kingdom Vanzanten PA-C 03/21/2024 12:35 PM

## 2024-03-21 NOTE — Progress Notes (Signed)
 Patient ID: [name] [name], female   DOB: [birth year], 43 y.o.   MRN: [id number]    Referring Physician(s): Dr. Alejandro Marker, DO; Dr. Nydia Distance, MD.   Supervising Physician: Johann Sieving  Patient Status:  [facility] - In-pt  Chief Complaint:  Gastroparesis, Dysphagia and malnutrition. S/p gastrostomy tube placement with Dr. Hughes 03/20/24  Subjective:  Pt reporting pain around Gtube site. Otherwise no new complaints.  Pt also seen today for successful re-placement of post pyloric NG tube, as pt had removed hers after G tube placement. Pt needing post pyloric access secondary to severe gastroparesis.   Allergies: Patient has no known allergies.  Medications: Prior to Admission medications   Medication Sig Start Date End Date Taking? Authorizing Provider  cycloSPORINE  (SANDIMMUNE ) 25 MG capsule Take 25 mg by mouth in the morning.   Yes [provider]  amLODipine  (NORVASC ) 10 MG tablet Take 1 tablet (10 mg total) by mouth daily. 02/05/22 11/06/23  Arlice Reichert, MD  aspirin  EC 81 MG tablet Chew 81 mg by mouth daily.    [provider]  busPIRone  (BUSPAR ) 10 MG tablet Take 10 mg by mouth 2 (two) times daily. 10/25/23   [provider]  cinacalcet  (SENSIPAR ) 30 MG tablet Take 3 tablets (90 mg total) by mouth daily with supper. 06/14/23   Amin, Sumayya, MD  cloNIDine  (CATAPRES  - DOSED IN MG/24 HR) 0.2 mg/24hr patch 0.2 mg once a week.    [provider]  cycloSPORINE  modified (NEORAL ) 100 MG capsule Take 2 capsules (200 mg total) by mouth 2 (two) times daily. 12/25/21   Pokhrel, Laxman, MD  Darbepoetin Alfa  (ARANESP ) 60 MCG/0.3ML SOSY injection Inject 0.3 mLs (60 mcg total) into the vein every Friday with hemodialysis. 11/18/21   Danton Reyes DASEN, MD  doxercalciferol  (HECTOROL ) 4 MCG/2ML injection Inject 3 mLs (6 mcg total) into the vein every Monday, Wednesday, and Friday with hemodialysis. 11/17/21   Danton Reyes DASEN, MD  DULoxetine  (CYMBALTA ) 20 MG  capsule Take 1 capsule (20 mg total) by mouth daily. 02/06/22 11/06/23  Arlice Reichert, MD  ferric citrate  (AURYXIA ) 1 GM 210 MG(Fe) tablet Take 210-630 mg by mouth See admin instructions. Take 3 tablets (630 mg) by mouth three times daily with meals, and take 1 tablet (210 mg) twice daily with snacks    [provider]  gabapentin  (NEURONTIN ) 100 MG capsule Take 100 mg by mouth 2 (two) times daily. 10/25/23   [provider]  hydrALAZINE  (APRESOLINE ) 25 MG tablet Take 3 tablets (75 mg total) by mouth every 8 (eight) hours. 02/05/22 11/06/23  Arlice Reichert, MD  hydrOXYzine  (VISTARIL ) 25 MG capsule Take 25 mg by mouth 3 (three) times daily as needed for anxiety. 05/26/23   [provider]  lidocaine -prilocaine  (EMLA ) cream Apply 1 application  topically daily as needed (prior to port access). 07/09/20   [provider]  losartan  (COZAAR ) 100 MG tablet Take 1 tablet (100 mg total) by mouth daily. 12/04/21 11/06/23  [place], Camellia PARAS, DO  MELATONIN PO Take 1 tablet by mouth at bedtime.    [provider]  metoCLOPramide  (REGLAN ) 5 MG tablet Take 5 mg by mouth 2 (two) times daily before a meal.    [provider]  Mycophenolate  Sodium (MYCOPHENOLIC ACID ) 180 MG TBEC Take 360 mg by mouth 2 (two) times daily.    [provider]  ondansetron  (ZOFRAN ) 4 MG tablet Take 4 mg by mouth every 8 (eight) hours as needed for nausea or vomiting.  [provider]  oxyCODONE -acetaminophen  (PERCOCET/ROXICET) 5-325 MG tablet Take 1 tablet by mouth 3 (three) times daily.    [provider]  predniSONE  (DELTASONE ) 5 MG tablet Take 15 mg by mouth daily. 06/24/15   [provider]  sodium zirconium cyclosilicate  (LOKELMA ) 10 g PACK packet Take 10 g by mouth daily.    [provider]  XPHOZAH 30 MG TABS Take 1 tablet by mouth 2 (two) times daily. 06/21/23   [provider]  zolpidem  (AMBIEN ) 10 MG tablet Take 5 mg by mouth at bedtime as  needed for sleep.    [provider]     Vital Signs: BP (!) 190/58   Pulse (!) 116   Temp 98.8 F (37.1 C) (Oral)   Resp 20   Ht 5' 7.5 (1.715 m)   Wt 121 lb 11.1 oz (55.2 kg)   LMP 02/13/2024 (Exact Date)   SpO2 94%   BMI 18.78 kg/m   Physical Exam Vitals and nursing note reviewed.  Cardiovascular:     Rate and Rhythm: Tachycardia present.     Comments: Has been persistent throughout hospital stay Pulmonary:     Effort: Pulmonary effort is normal.     Breath sounds: Normal breath sounds.  Abdominal:     Palpations: Abdomen is soft.     Tenderness: There is abdominal tenderness.     Comments: Ttp surrounding gastrostomy tube site. No overlying abnormality appreciated. Dressed appropriately.   Skin:    General: Skin is warm and dry.  Neurological:     Mental Status: She is alert and oriented to person, place, and time. Mental status is at baseline.     Imaging: DG Naso G Tube Plc W/Fl W/Rad Result Date: 03/21/2024 INDICATION: Patient is in the hospital with complications from hydrocephalus, ICH. Severe gastroparesis. Patient had G tube placed earlier today and patient pulled her prior post pyloric NG tube after. Unsuccessful post pyloric NG tube placement yesterday. Consult for re-attempt today. MEDICATIONS: 4 ml 2% viscous lidocaine .  35 mL Omnipaque  300 ANESTHESIA/SEDATION: None CONTRAST:  35 mL Omnipaque  300 - administered into the stomach and proximal duodenum. FLUOROSCOPY: Radiation Exposure Index (as provided by the fluoroscopic device): 101.50 mGy Kerma COMPLICATIONS: None immediate. PROCEDURE: Informed written consent was obtained after a thorough discussion of the procedural risks, benefits and alternatives. All questions were addressed. Maximal Sterile Barrier Technique was utilized including caps, mask, sterile gowns, sterile gloves, hand hygiene and skin antiseptic. A timeout was performed prior to the initiation of the procedure. Nasogastric tube remained  in the stomach advanced to the level of the pylorus from placement attempt yesterday. Viscous lidocaine  was initially given intranasally for pain relief while manipulating NG tube. Guidewire was introduced through the NG tube and was able to be advanced through the pylorus into the duodenum after multiple attempts. The NG tube was advanced over the guidewire into this space. Contrast was then administered with contrast seen traveling through the proximal small bowel showing successful post-pyloric placement in the lower descending duodenum. Guidewire was removed and NG tube was secured in place. Images saved. IMPRESSION: Successful post-pyloric nasogastric tube placement with tip in the lower descending duodenum. Performed by: Bailyn Spackman, PA-C Supervised by: Selinda Blue, MD Electronically Signed   By: Selinda DELENA Blue M.D.   On: 03/21/2024 12:51   DG [place] MATSU Tube Plc W/Fl W/Rad Result Date: 03/21/2024 INDICATION: Patient is in the hospital with complications from hydrocephalus, ICH. Significant gastroparesis. Patient had G tube placed earlier today  and patient pulled her prior post pyloric NG tube after. IR consulted to re-attempt placement of post pyloric NG tube. MEDICATIONS: 10 ml 2% viscous lidocaine .  50 mL Omnipaque  300; ANESTHESIA/SEDATION: None CONTRAST:  50 mL Omnipaque  300 - administered into the stomach FLUOROSCOPY: Radiation Exposure Index (as provided by the fluoroscopic device): 134.60 mGy Kerma COMPLICATIONS: None immediate. PROCEDURE: Informed written consent was obtained after a thorough discussion of the procedural risks, benefits and alternatives. All questions were addressed. Maximal Sterile Barrier Technique was utilized including caps, mask, sterile gowns, sterile gloves, sterile drape, hand hygiene and skin antiseptic. A timeout was performed prior to the initiation of the procedure. IR tech had advanced a 10 Fr NG tube into the stomach upon my arrival to the room. Multiple attempts at  advancing the guidewire and nasogastric tube were made. Contrast was administered and showed a significantly curved transition from the pylorus to the duodenum, further complicating passage of the guidewire and NG tube. Patient was moved to multiple positions and both air and 50 mL of water  was given to try and open the pylorus. Ultimately unsuccessful at passing the wire or nasogastric tube into the post pyloric space. NG tube was left in the stomach at the level of the pylorus. IMPRESSION: *Unsuccessful placement of post-pyloric nasogastric tube. The nasogastric tube was left in the stomach at the level of pylorus. Performed by: Shanzay Hepworth, PA-C Electronically Signed   By: Ree Molt M.D.   On: 03/21/2024 11:23   IR GASTROSTOMY TUBE MOD SED Result Date: 03/20/2024 INDICATION: Dysphagia. EXAM: PERCUTANEOUS GASTROSTOMY TUBE PLACEMENT COMPARISON:  CTA was, 03/19/2024 MEDICATIONS: Ancef  2 gm IV; Antibiotics were administered within 1 hour of the procedure. 0.5 mg glucagon  IV CONTRAST:  20 mL of Isovue  300 administered into the gastric lumen. ANESTHESIA/SEDATION: Moderate (conscious) sedation was employed during this procedure. A total of Versed  1.5 mg and Fentanyl  100 mcg was administered intravenously. Moderate Sedation Time: 22 minutes. The patient's level of consciousness and vital signs were monitored continuously by radiology nursing throughout the procedure under my direct supervision. FLUOROSCOPY: Radiation Exposure Index and estimated peak skin dose (PSD); Reference air kerma (RAK), 1.1 mGy. Kerma-area product (KAP), 24.1 uGy*m. COMPLICATIONS: None immediate. PROCEDURE: Informed written consent was obtained from the patient and/or patient's representative following explanation of the procedure, risks, benefits and alternatives. A time out was performed prior to the initiation of the procedure. Maximal barrier sterile technique utilized including caps, mask, sterile gowns, sterile gloves, large  sterile drape, hand hygiene and sterile prep. The LEFT costal margin and barium opacified transverse colon were identified and avoided. Air was injected into the stomach for insufflation and visualization under fluoroscopy. Under sterile conditions and local anesthesia, 2 T tacks were utilized to pexy the anterior aspect of the stomach against the ventral abdominal wall. Contrast injection confirmed appropriate positioning of each of the T tacks. An incision was made between the T tacks and a 17 gauge trocar needle was utilized to access the stomach. Needle position was confirmed within the stomach with aspiration of air and injection of a small amount of contrast. A stiff guidewire was advanced into the gastric lumen and under intermittent fluoroscopic guidance, the access needle was exchanged for a telescoping peel-away sheath, ultimately allowing placement of a 18 Fr balloon retention gastrostomy tube. The retention balloon was insufflated with a mixture of dilute saline and contrast and pulled taut against the anterior wall of the stomach. The external disc was cinched. Contrast injection confirms positioning within the  stomach. Several spot radiographic images were obtained in various obliquities for documentation. The patient tolerated procedure well without immediate post procedural complication. FINDINGS: After successful fluoroscopic guided placement, the gastrostomy tube is appropriately positioned with internal retention balloon against the ventral aspect of the gastric lumen. IMPRESSION: Successful fluoroscopic insertion of a 18 Fr balloon retention gastrostomy tube. The gastrostomy may be used immediately for medication administration and in 4 hrs for the initiation of feeds. RECOMMENDATIONS: The patient will return to Vascular Interventional Radiology (VIR) for routine feeding tube evaluation and exchange in 6 months. Thom Hall, MD [facility]  Radiology Electronically Signed   By: Thom Hall M.D.   On: 03/20/2024 17:17   CT ABDOMEN WO CONTRAST Result Date: 03/19/2024 CLINICAL DATA:  Evaluation of operative window for G-J tube placement in IR EXAM: CT ABDOMEN WITHOUT CONTRAST TECHNIQUE: Multidetector CT imaging of the abdomen was performed following the standard protocol without IV contrast. RADIATION DOSE REDUCTION: This exam was performed according to the departmental dose-optimization program which includes automated exposure control, adjustment of the mA and/or kV according to patient size and/or use of iterative reconstruction technique. COMPARISON:  02/13/2024 FINDINGS: Lower chest: Dependent bibasilar opacities compatible with atelectasis. No effusions. Mild cardiomegaly. Coronary artery and aortic atherosclerosis. Hepatobiliary: Small layering gallstones within the gallbladder. No biliary ductal dilatation or focal hepatic abnormality. Pancreas: No focal abnormality or ductal dilatation. Spleen: Splenomegaly with a craniocaudal length 14.7 cm. Scattered calcifications throughout the spleen. No suspicious focal abnormality. Adrenals/Urinary Tract: Adrenal glands normal. Atrophic kidneys with diffuse renovascular calcifications. No suspicious focal renal abnormality or hydronephrosis. Stomach/Bowel: Feeding tube coils in the fundus of the stomach with the tip in the distal stomach. Stomach, visualized large and small bowel decompressed. No bowel obstruction or inflammatory process. Clear operative window to the mid/distal stomach present. Vascular/Lymphatic: Aortic atherosclerosis. No evidence of aneurysm or adenopathy. Other: No free fluid or free air. Musculoskeletal: No acute bony abnormality. IMPRESSION: Splenomegaly. Cholelithiasis.  No CT evidence of acute cholecystitis. Bilateral atrophic kidneys. Heavily calcified aorta and branch vessels. No acute findings. Electronically Signed   By: Franky Crease M.D.   On: 03/19/2024 11:43     Labs:  CBC: Recent Labs    03/17/24 0542 03/19/24 0448 03/20/24 0540 03/21/24 0412  WBC 4.6 5.4 6.3 5.1  HGB 8.4* 9.0* 8.8* 9.0*  HCT 25.2* 27.5* 27.2* 27.8*  PLT 189 247 210 207    COAGS: Recent Labs    06/13/23 1345 02/14/24 1741 02/20/24 2221  INR 1.2 1.3* 1.3*  APTT 31 28 29     BMP: Recent Labs    03/17/24 0542 03/19/24 0448 03/20/24 0540 03/21/24 0412  NA 135 132* 133* 134*  K 4.2 4.2 3.9 4.2  CL 86* 86* 93* 94*  CO2 31 31 28 23   GLUCOSE 103* 104* 95 92  BUN 55* 35* 18 27*  CALCIUM  10.2 10.4* 9.9 10.0  CREATININE 6.96* 5.00* 3.52* 5.22*  GFRNONAA 7* 10* 16* 10*    LIVER FUNCTION TESTS: Recent Labs    03/15/24 0637 03/16/24 0652 03/17/24 0542 03/19/24 0448 03/20/24 0540 03/21/24 0412  BILITOT 0.5 0.6 0.6 0.5  --   --   AST 17 19 17 20   --   --   ALT 12 13 11 11   --   --   ALKPHOS 96 104 114 111  --   --   PROT 6.4* 6.3* 6.3* 6.3*  --   --   ALBUMIN 2.7* 2.9* 2.8* 2.8*  2.6* 2.6*    Assessment and Plan:  Day 1 post Gastrostomy tube placement - pt having pain surrounding g-tube site, appears normal. Dressed appropriately - Given gastroparesis and inability to give gastric feeds, G tube not being used for feeding at this time, although can be if needed. - Feeding being given for newly placed post pyloric NG tube today. - Tentative plan from nutrition team is Gastrostomy to gastrojejunostomy conversion after tract heals in ~4 weeks for jejunal feeds and gastric venting. - IR to sign off regarding G tube for now.   Please reach out to IR with any additional questions or concerns.  Electronically Signed: Kimble VEAR Clas, PA-C 03/21/2024, 3:23 PM   I spent a total of 15 Minutes at the the patient's bedside AND on the patient's hospital floor or unit, greater than 50% of which was counseling/coordinating care for gastrostomy tube follow up.

## 2024-03-21 NOTE — Progress Notes (Signed)
 Daily Progress Note   Date: 03/21/2024   Patient Name: [name] [name]  DOB: [birth year]  MRN: [id number]  Age / Sex: 43 y.o., female  Attending Physician: Davia Nydia POUR, MD Primary Care Physician: [facility] [place] [facility] Admit Date: 02/13/2024 Length of Stay: 36 days  Reason for Consultation: Establishing goals of care  Past Medical History:  Diagnosis Date   Anemia    Anxiety    DM (diabetes mellitus), type 1 (HCC)    ESRD on hemodialysis (HCC)    Gastroparesis    History of simultaneous kidney and pancreas transplant (HCC) 2014   Hypertension    Ischemic cerebrovascular accident (CVA) (HCC) 11/2021   Narcotic abuse (HCC)    Non compliance with medical treatment    Stroke (HCC)    Vision loss     Subjective:   Subjective: Chart Reviewed. Updates received. Patient Assessed. Created space and opportunity for patient  and family to explore thoughts and feelings regarding current medical situation.  Today's Discussion: Today before meeting with the patient/family, I reviewed the chart including internal medicine note from today, nephrology note from today, radiology and dietitian notes from today.  The patient had gone down for planned venting G-tube or J-tube extension for nutrition yesterday but G-tube was placed instead.  Required reattempt at core track placement postpyloric for nutrition given inability to tolerate gastric nutrition.  Core track was placed yesterday but not postpyloric, plans for reattempt today to advance postpyloric.  Thus far patient has desired full code and full scope of care including G-tube/J-tube placement.  Have been attempting to complete advance directives naming friend Ole as HCPOA.  I also reviewed labs today including CBC which shows no leukocytosis, stable hemoglobin at 9.0.  Renal function panel shows persistent but stable hyponatremia compared to yesterday at 134, expected increase in creatinine given ESRD at 5.22 today, BUN increased to  27 from 18 yesterday.  I went see the patient, no family was bedside.  The patient was sleeping and has had multiple procedures/attempted procedures in the past 24 hours I opted to let her sleep and rest.    I discussed patient's care with the nurse.  She continues to have intermittent pain which is improved with pain medication.  She went to IR earlier today for postpyloric advancement of her core track which was successful and they could not use for medications.  Anticipate eventual J-tube extension on G-tube after 4 weeks per IR.  Otherwise the patient has done well today.  After seeing the patient I discussed with the chaplain and we were hoping to meet with the patient's friend Ole to discuss advance directives with him, although he is not required to be there for them to be completed.  We agreed that we would check back on Monday and if we are unable to contact him and we will get patient's permission to call discussed with him over the phone.  We will also continue attempt to get [facility] documentation completed naming Ole as POA.  Review of Systems  Unable to perform ROS   Objective:   Primary Diagnoses: Present on Admission:  (Resolved) Intractable nausea and vomiting  Colitis  Acute respiratory failure with hypoxia (HCC)  Hyperkalemia  Acute metabolic encephalopathy  Hypertensive urgency  Prolonged QT interval  Pancytopenia (HCC)  Controlled type 2 diabetes mellitus with complication, without long-term current use of insulin  (HCC)  Abnormal CT of the abdomen  Sepsis (HCC)   Vital Signs:  BP (!) 170/68 (BP Location: Left  Leg)   Pulse (!) 114   Temp 99.2 F (37.3 C) (Oral)   Resp 15   Ht 5' 7.5 (1.715 m)   Wt 55.2 kg   LMP 02/13/2024 (Exact Date)   SpO2 97%   BMI 18.78 kg/m   Physical Exam Vitals and nursing note reviewed.  Constitutional:      General: She is sleeping. She is not in acute distress.    Comments: No objective signs of discomfort or distress   Cardiovascular:     Rate and Rhythm: Normal rate.  Pulmonary:     Effort: Pulmonary effort is normal. No respiratory distress.  Abdominal:     General: Abdomen is flat.  Skin:    General: Skin is warm and dry.     Palliative Assessment/Data: 10% (not tolerating any oral intake)   Existing Vynca/ACP Documentation: None  Assessment & Plan:   HPI/Patient Profile:  43 y.o. 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  She was admitted on 02/13/2024 with possible sepsis secondary to colitis, abdominal pain, nausea, vomiting, history of gastroparesis, acute respiratory failure with hypoxia secondary to volume overload history of pancreatic/renal transplant in 2015, ESRD on HD, acute metabolic encephalopathy, hypertensive urgency, and others.    She has had a very complicated admission as outlined below:   Significant Hospital Events:  6/11 ED w n/v AMS. Abx IVF for colitis 6/12 Admit. AMS decr LOC. Incr O2 req. PCCM consult and code stroke called seen w rapid response nephro NP then neuro  6/13 Intubated with EVD, responding to commands  6/14 Extubated 6/17 acute L cerebellar infarct. Keppra  dc  6/18 Dialysis, fevering, EVD at 10 (129 mL last 24 hrs) 6/19 Narcotic dependence a driving factor. +Vasospasm on TCD. CTA with evolving Left cerebellar infarct, no HT or mass effect, substantially decreased caliber of multiple intracranial arteries and bilateral Circle-of-Willis branches (especially: Distal left V4, supraclinoid ICAs, MCA and ACA origins), appearance c/w widespread Vasospasm, Vasoconstriction. No associated large vessel occlusion. 6/21 CSF sampline from EVD -- WBC 244 RBC 19000, cx ngtd  6/25: diagnostic angio today with nsgy - negative 6/26 evd leaking when clamped  6/30 evd removed, HD overnight, HTN improved, 7 days cefazolin  complete 7/1  more lethargic> minimal response to narcan  x2, oxy stopped, EEG neg, MRI - new small left parietal cva, cortrak placed postpyloric, vagal episodes with N/V 7/2 iHD, vimpat  started 7/9 she appears agitated.  IVs were removed.  LP to be initiated to evaluate her opening pressure and to ensure that she does not have meningitis if this is still pending to be done. 7/10 LP done at bedside by Neurosurgery. Cortrak was removed yesterday evening and replaced by Fluoro team today.  7/11 Cortrak obstructed and reomved and had to be replaced by Flouro. Calorie Count initiated. Undergoing Dialysis today  7/12: Having chest discomfort. Palliative Care Consulted for GOC Discussion   Palliative medicine was consulted for GOC conversations.  SUMMARY OF RECOMMENDATIONS   Full code Full scope of care Continue attempts at HCPOA completion Palliative medicine will continue to follow and support  Symptom Management:  Per primary team PMT is available to assist as needed  Code Status: Full code  Prognosis: Unable to determine  Discharge Planning: To Be Determined  Discussed with: Medical team, nursing team, chaplain  Thank you for allowing us  to participate in the  care of Keigan Naseer Ridgley PMT will continue to support holistically.  Billing based on MDM: Moderate  Detailed review of medical records (labs, imaging, vital signs), medically appropriate exam, discussed with treatment team, counseling and education to patient, family, & staff, documenting clinical information, medication management, coordination of care  Camellia Kays, NP Palliative Medicine Team  Team Phone # [phone] (Nights/Weekends)  05/03/2021, 8:17 AM

## 2024-03-21 NOTE — Progress Notes (Signed)
 OT Cancellation Note  Patient Details Name: [name] [name] MRN: [id number] DOB: [birth year]   Cancelled Treatment:    Reason Eval/Treat Not Completed: Other (comment) Pt declined participating in OT treatment session this afternoon. Reports 10/10 pain level at new PEG tube site. Offered bed level treatment although pt continued to decline.  Communicated with nursing regarding pt's request for pain medication.   Leita Howell, OTR/L,CBIS  Supplemental OT - [facility] and [facility] Secure Chat Preferred   03/21/2024, 1:31 PM

## 2024-03-21 NOTE — Progress Notes (Addendum)
 Nutrition Brief Note  Fluoroscopy successful in placing post pyloric NGT. Pt now has safe enteral access and can resume tube feeds. Pt has been NPO for 24 plus hours, titrate tube feeds to goal and monitor for tolerance. Made RN aware.   INTERVENTION:  When post pyloric enteral access is obtained: Nepro at 20 ml/hr and increase by 10 ml every 6 hours until goal of 45 ml/hr (1,080 ml per day)  *If patient has any episodes of emesis, stop tube feeds    Provides 1,911 kcal (100% of calorie needs), 87 gm protein (100% protein needs) , 785 ml free water  daily   Encourage PO intake if diet advanced House trays for now  Continue extra servings of soup on meal trays to increase intake  Ensure Plus High Protein po BID, each supplement provides 350 kcal and 20 grams of protein  *Nepro is not ideal with pt's with gastroparesis due to it's high fat and fiber content, especially if it is gastric. Rena-Vite daily  If pt cannot tolerate a FLD, recommend converting PEG tube into G-J tube (G-tube for venting and J-tube for feeds) after 4 weeks period set by radiology     Olivia Kenning, RD Registered Dietitian  See Amion for more information

## 2024-03-21 NOTE — Progress Notes (Signed)
 Cortrak Tube Team Note:  Consult received to place a bridle for small bore feeding tube access.  Noted PEG placed earlier today instead of the G-J tube planned. Pt unable to tolerate gastric feedings and therefore required attempt in fluoro to place a post pyloric small bore feeding tube.   Tube secured in the R nare at 120 cm.  Per fluoro plan is to attempt advancement of small bore tube post pyloric next date.    Powell SQUIBB., RD, LDN, CNSC See AMiON for contact information

## 2024-03-21 NOTE — Plan of Care (Signed)
  Problem: Clinical Measurements: Goal: Ability to maintain clinical measurements within normal limits will improve Outcome: Not Progressing   Problem: Pain Managment: Goal: General experience of comfort will improve and/or be controlled Outcome: Not Progressing

## 2024-03-21 NOTE — Progress Notes (Signed)
 [name], is a 43 y.o. 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 Hypertensive in ED SBP 240s. CT scan suggested colitis. Patient's mentation continued to worsen. CT head was subsequently done which revealed intracranial hemorrhage. Patient was admitted to the ICU.   Significant Hospital Events:  6/11 ED w n/v AMS. Abx IVF for colitis 6/12 Admit. AMS decr LOC. Incr O2 req. PCCM consult and code stroke called seen w rapid response nephro NP then neuro  6/13 Intubated with EVD, responding to commands  6/14 Extubated 6/17 acute L cerebellar infarct. Keppra  dc  6/18 Dialysis, fevering, EVD at 10 (129 mL last 24 hrs) 6/19 Narcotic dependence a driving factor. +Vasospasm on TCD. CTA with evolving Left cerebellar infarct, no HT or mass effect, substantially decreased caliber of multiple intracranial arteries and bilateral Circle-of[place] branches (especially: Distal left V4, supraclinoid ICAs, MCA and ACA origins), appearance c/w widespread Vasospasm, Vasoconstriction. No associated large vessel occlusion. 6/21 CSF sampline from EVD -- WBC 244 RBC 19000, cx ngtd  6/25: diagnostic angio today with nsgy - negative 6/26 evd leaking when clamped  6/30 evd removed, HD overnight, HTN improved, 7 days cefazolin  complete 7/1 more lethargic> minimal response to narcan  x2, oxy stopped, EEG neg, MRI - new small left parietal cva, cortrak placed postpyloric, vagal episodes with N/V 7/2 iHD, vimpat  started 7/9 she appears agitated.  IVs were  removed.  LP to be initiated to evaluate her opening pressure and to ensure that she does not have meningitis if this is still pending to be done. 7/10 LP done at bedside by Neurosurgery. Cortrak was removed yesterday evening and replaced by Fluoro team today.  7/11 Cortrak obstructed and reomved and had to be replaced by Flouro. Calorie Count initiated. Undergoing Dialysis today  7/12: Having chest discomfort. Palliative Care Consulted for GOC Discussion 7/14: Agreeable to J-tube with venting G-tube. IR consulted. SLP continues to recommend Thin (Full) Liquids 7/16: G-tube placed  Assessment & Plan    Principal Problem:   SAH (subarachnoid hemorrhage) (HCC), ICH with brain compression, communicating hydrocephalus - seen by neurology and neurosurgery s/p right frontal pressure ventriculostomy via burr hole and EVD was removed on 6/30. -MRI brain showed small foci of hemorrhage in the posterior left cerebellum without evidence for acute infarct. Punctate focus of acute infarct involving the medial left occipital cortex, evolving subacute infarct in the left parietal lobe, trace blood products.  No change in management per neurology and neurosurgery. - Due to concern for hydrocephalus, neurosurgery did LP, spinal fluid pressures were low and ruled out meningitis.  Per  Dr. Mavis, she would not benefit from a VP shunt  -Palliative medicine following for GOC.  Oropharyngeal Dysphagia: History of severe gastroparesis with nausea and vomiting - Mentation has improved, placed on full liquid diet however failed calorie count, has nausea vomiting due to gastroparesis.  - Dietitians recommended J-tube w/venting G-tube.   - IR was consulted, G-tube was placed on 7/16.  However dietitian recommended if it can be converted to J tube due to significant gastroparesis.   - Patient pulled out the core track on 7/17, now plans for fluoroscopy guided post pyloric NG tube placement  - Continue Reglan  5 mg IV  every 8 hours  Concern for seizure activity: - Evaluated by neurology, underwent EEG.  - Continue with Vimpat , seizure precautions   Vasospasm with left cerebellar infarct:  -Seen by Neurology.  Noted to be on aspirin . -LDL 63.  HbA1c 4.2. - PT evaluation recommended SNF, patient will benefit in the LTAC/select facility given hemodialysis and ongoing significant medical issues.    Essential Hypertension: - Midodrine  has been discontinued.  - Continue amlodipine , IV hydralazine , labetalol  as needed    Chest Pain - Atypical, 2D echo showed EF normal 60 to 65%, with no regional WMA, mild LVH -Continue pain control as needed   ESRD on hemodialysis, MWF, hyperkalemia - Management per nephrology - chronically immunosuppressed with CellCept , Cyclosporine , and Prednisone     Bradycardia:  -Secondary to vagal response. Resolved.   Abdominal Discomfort and Chest Discomfort:  - Continue pain control as needed     Colitis: Stable.     Status post Pancreatic Transplant/Diabetes mellitus  CBG (last 3)  Recent Labs    03/20/24 2303 03/21/24 0336 03/21/24 0810  GLUCAP 85 92 98   Continue sensitive sliding scale insulin    Anemia of Chronic Disease H&H stable  Moderate protein calorie malnutrition, underweight Nutrition Problem: Moderate Malnutrition Etiology: chronic illness (ESRD on HD/DM) Signs/Symptoms: mild fat depletion, mild muscle depletion Interventions: Tube feeding  Estimated body mass index is 18.78 kg/m as calculated from the following:   Height as of this encounter: 5' 7.5 (1.715 m).   Weight as of this encounter: 55.2 kg.  Code Status:  DVT Prophylaxis:  heparin  injection 5,000 Units Start: 03/14/24 0600 SCDs Start: 02/14/24 0725   Level of Care: Level of care: Progressive Family Communication: Updated patient Disposition Plan:      Remains inpatient appropriate: TBD   Procedures:    Consultants:   Neurosurgery Nephrology IR  Antimicrobials:    Anti-infectives (From admission, onward)    Start     Dose/Rate Route Frequency Ordered Stop   03/20/24 0845  ceFAZolin  (ANCEF ) IVPB 2g/100 mL premix        over 30 Minutes Intravenous Continuous PRN 03/20/24 0859 03/20/24 0845   02/27/24 1200  vancomycin  (VANCOREADY) IVPB 500 mg/100 mL  Status:  Discontinued        500 mg 100 mL/hr over 60 Minutes Intravenous Every M-W-F (Hemodialysis) 02/25/24 1129 02/25/24 1436   02/26/24 2200  ceFAZolin  (ANCEF ) IVPB 1 g/50 mL premix        1 g 100 mL/hr over 30 Minutes Intravenous Every 24 hours 02/26/24 1009 03/03/24 2140   02/25/24 1530  vancomycin  (VANCOREADY) IVPB 500 mg/100 mL        500 mg 100 mL/hr over 60 Minutes Intravenous Every M-W-F (Hemodialysis) 02/25/24 1436 02/25/24 1934   02/23/24 1800  meropenem  (MERREM ) 1 g in sodium chloride  0.9 % 100 mL IVPB  Status:  Discontinued  1 g 200 mL/hr over 30 Minutes Intravenous Every 24 hours 02/23/24 1625 02/26/24 1009   02/23/24 1800  vancomycin  (VANCOREADY) IVPB 1250 mg/250 mL        1,250 mg 166.7 mL/hr over 90 Minutes Intravenous  Once 02/23/24 1625 02/23/24 1933   02/17/24 2200  piperacillin -tazobactam (ZOSYN ) IVPB 2.25 g        2.25 g 100 mL/hr over 30 Minutes Intravenous Every 8 hours 02/17/24 1412 02/21/24 2336   02/14/24 1800  vancomycin  (VANCOREADY) IVPB 2000 mg/400 mL        2,000 mg 200 mL/hr over 120 Minutes Intravenous  Once 02/14/24 1709 02/14/24 2048   02/14/24 1622  piperacillin -tazobactam (ZOSYN ) IVPB 3.375 g  Status:  Discontinued        3.375 g 100 mL/hr over 30 Minutes Intravenous Every 6 hours 02/14/24 1622 02/17/24 1412   02/14/24 1545  piperacillin -tazobactam (ZOSYN ) IVPB 3.375 g  Status:  Discontinued        3.375 g 12.5 mL/hr over 240 Minutes Intravenous Every 8 hours 02/14/24 1451 02/14/24 1453   02/14/24 1545  piperacillin -tazobactam (ZOSYN ) IVPB 2.25 g  Status:  Discontinued        2.25 g 100 mL/hr over 30 Minutes Intravenous Every 8 hours 02/14/24 1453  02/14/24 1622   02/14/24 0800  cefTRIAXone  (ROCEPHIN ) 2 g in sodium chloride  0.9 % 100 mL IVPB  Status:  Discontinued        2 g 200 mL/hr over 30 Minutes Intravenous Every 24 hours 02/14/24 0730 02/14/24 1451   02/14/24 0800  metroNIDAZOLE  (FLAGYL ) IVPB 500 mg  Status:  Discontinued        500 mg 100 mL/hr over 60 Minutes Intravenous Every 12 hours 02/14/24 0730 02/14/24 1451          Medications  amLODipine   10 mg Oral QPM   aspirin   81 mg Oral Daily   Chlorhexidine  Gluconate Cloth  6 each Topical Q0600   Chlorhexidine  Gluconate Cloth  6 each Topical Q0600   Chlorhexidine  Gluconate Cloth  6 each Topical Q0600   cycloSPORINE   200 mg Oral QHS   cycloSPORINE   225 mg Oral Daily   darbepoetin (ARANESP ) injection - DIALYSIS  100 mcg Subcutaneous Q Fri-1800   feeding supplement  237 mL Oral BID BM   feeding supplement (NEPRO CARB STEADY)  1,000 mL Per Tube Q24H   heparin  injection (subcutaneous)  5,000 Units Subcutaneous Q8H   insulin  aspart  0-9 Units Subcutaneous Q4H   lacosamide   100 mg Oral BID   metoCLOPramide  (REGLAN ) injection  5 mg Intravenous Q8H   multivitamin  1 tablet Oral QHS   mycophenolate   500 mg Oral BID   predniSONE   15 mg Oral Q breakfast   sevelamer  carbonate  2.4 g Oral TID WC   sodium chloride  flush  3 mL Intravenous Q12H      Subjective:   [name] [name] was seen and examined today.  Appears somewhat frustrated.  However states that her mom will be traveling to come visit her in 2 weeks.  Unfortunately the with the right tube is not in place, plans for fluoroscopy guided NGT today  Objective:   Vitals:   03/20/24 1950 03/20/24 2305 03/21/24 0310 03/21/24 0813  BP: (!) 183/75 (!) 178/66 (!) 193/59 (!) 186/84  Pulse:  (!) 108 (!) 117 (!) 116  Resp: 13 16 17 20   Temp: 98.8 F (37.1 C) 99.3 F (37.4 C) 97.8 F (36.6 C) 98.8 F (37.1 C)  TempSrc:  Oral Oral Oral Oral  SpO2: 97% 97% 94% 94%  Weight:      Height:        Intake/Output Summary (Last 24  hours) at 03/21/2024 1049 Last data filed at 03/20/2024 1333 Gross per 24 hour  Intake 0 ml  Output --  Net 0 ml     Wt Readings from Last 3 Encounters:  03/19/24 55.2 kg  11/07/23 57.3 kg  09/26/23 56.7 kg   Physical Exam General: Alert and oriented x 3, ill-appearing Cardiovascular: S1 S2 clear, RRR.  Respiratory: CTAB, no wheezing Gastrointestinal: Soft, nontender, nondistended, NBS Ext: no pedal edema bilaterally, RUE AV fistula Neuro: no new deficits Psych: Normal affect   Data Reviewed:  I have personally reviewed following labs    CBC Lab Results  Component Value Date   WBC 5.1 03/21/2024   RBC 2.87 (L) 03/21/2024   HGB 9.0 (L) 03/21/2024   HCT 27.8 (L) 03/21/2024   MCV 96.9 03/21/2024   MCH 31.4 03/21/2024   PLT 207 03/21/2024   MCHC 32.4 03/21/2024   RDW 15.3 03/21/2024   LYMPHSABS 1.3 03/19/2024   MONOABS 0.5 03/19/2024   EOSABS 0.1 03/19/2024   BASOSABS 0.0 03/19/2024     Last metabolic panel Lab Results  Component Value Date   NA 134 (L) 03/21/2024   K 4.2 03/21/2024   CL 94 (L) 03/21/2024   CO2 23 03/21/2024   BUN 27 (H) 03/21/2024   CREATININE 5.22 (H) 03/21/2024   GLUCOSE 92 03/21/2024   GFRNONAA 10 (L) 03/21/2024   GFRAA  02/02/2022    PATIENT IDENTIFICATION ERROR. PLEASE DISREGARD RESULTS. ACCOUNT WILL BE CREDITED.   CALCIUM  10.0 03/21/2024   PHOS 4.6 03/21/2024   PROT 6.3 (L) 03/19/2024   ALBUMIN 2.6 (L) 03/21/2024   BILITOT 0.5 03/19/2024   ALKPHOS 111 03/19/2024   AST 20 03/19/2024   ALT 11 03/19/2024   ANIONGAP 17 (H) 03/21/2024    CBG (last 3)  Recent Labs    03/20/24 2303 03/21/24 0336 03/21/24 0810  GLUCAP 85 92 98      Coagulation Profile: No results for input(s): INR, PROTIME in the last 168 hours.   Radiology Studies: I have personally reviewed the imaging studies  IR GASTROSTOMY TUBE MOD SED Result Date: 03/20/2024 INDICATION: Dysphagia. EXAM: PERCUTANEOUS GASTROSTOMY TUBE PLACEMENT COMPARISON:  CTA  was, 03/19/2024 MEDICATIONS: Ancef  2 gm IV; Antibiotics were administered within 1 hour of the procedure. 0.5 mg glucagon  IV CONTRAST:  20 mL of Isovue  300 administered into the gastric lumen. ANESTHESIA/SEDATION: Moderate (conscious) sedation was employed during this procedure. A total of Versed  1.5 mg and Fentanyl  100 mcg was administered intravenously. Moderate Sedation Time: 22 minutes. The patient's level of consciousness and vital signs were monitored continuously by radiology nursing throughout the procedure under my direct supervision. FLUOROSCOPY: Radiation Exposure Index and estimated peak skin dose (PSD); Reference air kerma (RAK), 1.1 mGy. Kerma-area product (KAP), 24.1 uGy*m. COMPLICATIONS: None immediate. PROCEDURE: Informed written consent was obtained from the patient and/or patient's representative following explanation of the procedure, risks, benefits and alternatives. A time out was performed prior to the initiation of the procedure. Maximal barrier sterile technique utilized including caps, mask, sterile gowns, sterile gloves, large sterile drape, hand hygiene and sterile prep. The LEFT costal margin and barium opacified transverse colon were identified and avoided. Air was injected into the stomach for insufflation and visualization under fluoroscopy. Under sterile conditions and local anesthesia, 2 T tacks were utilized to pexy the anterior  aspect of the stomach against the ventral abdominal wall. Contrast injection confirmed appropriate positioning of each of the T tacks. An incision was made between the T tacks and a 17 gauge trocar needle was utilized to access the stomach. Needle position was confirmed within the stomach with aspiration of air and injection of a small amount of contrast. A stiff guidewire was advanced into the gastric lumen and under intermittent fluoroscopic guidance, the access needle was exchanged for a telescoping peel-away sheath, ultimately allowing placement of a 18  Fr balloon retention gastrostomy tube. The retention balloon was insufflated with a mixture of dilute saline and contrast and pulled taut against the anterior wall of the stomach. The external disc was cinched. Contrast injection confirms positioning within the stomach. Several spot radiographic images were obtained in various obliquities for documentation. The patient tolerated procedure well without immediate post procedural complication. FINDINGS: After successful fluoroscopic guided placement, the gastrostomy tube is appropriately positioned with internal retention balloon against the ventral aspect of the gastric lumen. IMPRESSION: Successful fluoroscopic insertion of a 18 Fr balloon retention gastrostomy tube. The gastrostomy may be used immediately for medication administration and in 4 hrs for the initiation of feeds. RECOMMENDATIONS: The patient will return to Vascular Interventional Radiology (VIR) for routine feeding tube evaluation and exchange in 6 months. Thom Hall, MD [facility] Electronically Signed   By: Thom Hall M.D.   On: 03/20/2024 17:17       Korayma Hagwood M.D. [name] 03/21/2024, 10:49 AM  Available via Epic secure chat 7am-7pm After 7 pm, please refer to night coverage provider listed on amion.

## 2024-03-22 DIAGNOSIS — Z4659 Encounter for fitting and adjustment of other gastrointestinal appliance and device: Secondary | ICD-10-CM | POA: Diagnosis not present

## 2024-03-22 DIAGNOSIS — I609 Nontraumatic subarachnoid hemorrhage, unspecified: Secondary | ICD-10-CM | POA: Diagnosis not present

## 2024-03-22 DIAGNOSIS — N186 End stage renal disease: Secondary | ICD-10-CM | POA: Diagnosis not present

## 2024-03-22 DIAGNOSIS — R112 Nausea with vomiting, unspecified: Secondary | ICD-10-CM | POA: Diagnosis not present

## 2024-03-22 LAB — RENAL FUNCTION PANEL
Albumin: 2.5 g/dL — ABNORMAL LOW (ref 3.5–5.0)
Anion gap: 15 (ref 5–15)
BUN: 37 mg/dL — ABNORMAL HIGH (ref 6–20)
CO2: 24 mmol/L (ref 22–32)
Calcium: 9.8 mg/dL (ref 8.9–10.3)
Chloride: 95 mmol/L — ABNORMAL LOW (ref 98–111)
Creatinine, Ser: 6.73 mg/dL — ABNORMAL HIGH (ref 0.44–1.00)
GFR, Estimated: 7 mL/min — ABNORMAL LOW (ref >60)
Glucose, Bld: 84 mg/dL (ref 70–99)
Phosphorus: 5.3 mg/dL — ABNORMAL HIGH (ref 2.5–4.6)
Potassium: 4.7 mmol/L (ref 3.5–5.1)
Sodium: 134 mmol/L — ABNORMAL LOW (ref 135–145)

## 2024-03-22 LAB — GLUCOSE, CAPILLARY
Glucose-Capillary: 88 mg/dL (ref 70–99)
Glucose-Capillary: 91 mg/dL (ref 70–99)
Glucose-Capillary: 112 mg/dL — ABNORMAL HIGH (ref 70–99)
Glucose-Capillary: 130 mg/dL — ABNORMAL HIGH (ref 70–99)
Glucose-Capillary: 121 mg/dL — ABNORMAL HIGH (ref 70–99)
Glucose-Capillary: 106 mg/dL — ABNORMAL HIGH (ref 70–99)

## 2024-03-22 LAB — CBC
HCT: 27.3 % — ABNORMAL LOW (ref 36.0–46.0)
Hemoglobin: 8.9 g/dL — ABNORMAL LOW (ref 12.0–15.0)
MCH: 31.9 pg (ref 26.0–34.0)
MCHC: 32.6 g/dL (ref 30.0–36.0)
MCV: 97.8 fL (ref 80.0–100.0)
Platelets: 220 K/uL (ref 150–400)
RBC: 2.79 MIL/uL — ABNORMAL LOW (ref 3.87–5.11)
RDW: 15.3 % (ref 11.5–15.5)
WBC: 4.9 K/uL (ref 4.0–10.5)
nRBC: 0 % (ref 0.0–0.2)

## 2024-03-22 LAB — BASIC METABOLIC PANEL WITH GFR
Anion gap: 18 — ABNORMAL HIGH (ref 5–15)
BUN: 44 mg/dL — ABNORMAL HIGH (ref 6–20)
CO2: 22 mmol/L (ref 22–32)
Calcium: 9.9 mg/dL (ref 8.9–10.3)
Chloride: 92 mmol/L — ABNORMAL LOW (ref 98–111)
Creatinine, Ser: 7.67 mg/dL — ABNORMAL HIGH (ref 0.44–1.00)
GFR, Estimated: 6 mL/min — ABNORMAL LOW (ref >60)
Glucose, Bld: 121 mg/dL — ABNORMAL HIGH (ref 70–99)
Potassium: 5.3 mmol/L — ABNORMAL HIGH (ref 3.5–5.1)
Sodium: 132 mmol/L — ABNORMAL LOW (ref 135–145)

## 2024-03-22 MED ORDER — CARVEDILOL 12.5 MG PO TABS: 12.5000 mg | ORAL_TABLET | Freq: Two times a day (BID) | ORAL | Status: DC

## 2024-03-22 MED ORDER — ALTEPLASE 2 MG IJ SOLR: 2.0000 mg | Freq: Once | INTRAMUSCULAR | Status: DC | PRN

## 2024-03-22 MED ORDER — HEPARIN SODIUM (PORCINE) 1000 UNIT/ML DIALYSIS: 1000.0000 [IU] | INTRAMUSCULAR | Status: DC | PRN

## 2024-03-22 MED ORDER — SODIUM ZIRCONIUM CYCLOSILICATE 10 G PO PACK
10.0000 g | PACK | Freq: Once | ORAL | Status: AC
  Administered 2024-03-22: 10 g via ORAL
  Filled 2024-03-22: qty 1

## 2024-03-22 MED ORDER — METHYLPREDNISOLONE SODIUM SUCC 40 MG IJ SOLR
12.0000 mg | Freq: Every day | INTRAMUSCULAR | Status: DC
  Administered 2024-03-22 – 2024-03-23 (×2): 12 mg via INTRAVENOUS
  Filled 2024-03-22 (×2): qty 0.3

## 2024-03-22 MED ORDER — DEXTROSE 5 % IV SOLN
70.0000 mg | Freq: Two times a day (BID) | INTRAVENOUS | Status: DC
  Administered 2024-03-22: 70 mg via INTRAVENOUS
  Filled 2024-03-22 (×4): qty 1.4

## 2024-03-22 MED ORDER — ACETAMINOPHEN 325 MG PO TABS
650.0000 mg | ORAL_TABLET | ORAL | Status: DC | PRN
  Administered 2024-03-28: 650 mg
  Filled 2024-03-22: qty 2

## 2024-03-22 MED ORDER — PENTAFLUOROPROP-TETRAFLUOROETH EX AERO: 1.0000 | INHALATION_SPRAY | CUTANEOUS | Status: DC | PRN

## 2024-03-22 MED ORDER — HYDRALAZINE HCL 20 MG/ML IJ SOLN
10.0000 mg | INTRAMUSCULAR | Status: DC | PRN
  Administered 2024-03-22 – 2024-03-30 (×8): 10 mg via INTRAVENOUS
  Filled 2024-03-22 (×8): qty 1

## 2024-03-22 MED ORDER — SIMETHICONE 40 MG/0.6ML PO SUSP: 40.0000 mg | Freq: Four times a day (QID) | ORAL | Status: DC | PRN

## 2024-03-22 MED ORDER — METOPROLOL TARTRATE 5 MG/5ML IV SOLN
5.0000 mg | Freq: Four times a day (QID) | INTRAVENOUS | Status: DC
  Administered 2024-03-22 – 2024-03-23 (×3): 5 mg via INTRAVENOUS
  Filled 2024-03-22 (×4): qty 5

## 2024-03-22 MED ORDER — MYCOPHENOLATE 200 MG/ML ORAL SUSPENSION
500.0000 mg | Freq: Two times a day (BID) | ORAL | Status: DC
  Filled 2024-03-22: qty 2.5

## 2024-03-22 MED ORDER — MYCOPHENOLATE MOFETIL HCL 500 MG IV SOLR
500.0000 mg | Freq: Two times a day (BID) | INTRAVENOUS | Status: DC
  Administered 2024-03-22 – 2024-03-23 (×2): 500 mg via INTRAVENOUS
  Filled 2024-03-22 (×4): qty 15

## 2024-03-22 MED ORDER — BUTALBITAL-APAP-CAFFEINE 50-325-40 MG PO TABS: 1.0000 | ORAL_TABLET | ORAL | Status: DC | PRN

## 2024-03-22 MED ORDER — LOPERAMIDE HCL 1 MG/7.5ML PO SUSP: 2.0000 mg | ORAL | Status: DC | PRN

## 2024-03-22 MED ORDER — CYCLOSPORINE MODIFIED (NEORAL) 100 MG/ML PO SOLN: 225.0000 mg | Freq: Every day | ORAL | Status: DC

## 2024-03-22 MED ORDER — ENSURE PLUS HIGH PROTEIN PO LIQD: 237.0000 mL | Freq: Two times a day (BID) | ORAL | Status: DC

## 2024-03-22 MED ORDER — CYCLOSPORINE MODIFIED (NEORAL) 100 MG/ML PO SOLN
200.0000 mg | Freq: Every day | ORAL | Status: DC
  Filled 2024-03-22: qty 2

## 2024-03-22 MED ORDER — LIDOCAINE-PRILOCAINE 2.5-2.5 % EX CREA: 1.0000 | TOPICAL_CREAM | CUTANEOUS | Status: DC | PRN

## 2024-03-22 MED ORDER — LACOSAMIDE 50 MG PO TABS: 100.0000 mg | ORAL_TABLET | Freq: Two times a day (BID) | ORAL | Status: DC

## 2024-03-22 MED ORDER — SODIUM CHLORIDE 0.9 % IV SOLN
100.0000 mg | Freq: Two times a day (BID) | INTRAVENOUS | Status: DC
  Administered 2024-03-22 – 2024-03-23 (×3): 100 mg via INTRAVENOUS
  Filled 2024-03-22 (×4): qty 10

## 2024-03-22 MED ORDER — CALCIUM GLUCONATE-NACL 1-0.675 GM/50ML-% IV SOLN
1.0000 g | Freq: Once | INTRAVENOUS | Status: DC
  Filled 2024-03-22: qty 50

## 2024-03-22 MED ORDER — CARVEDILOL 6.25 MG PO TABS: 6.2500 mg | ORAL_TABLET | Freq: Two times a day (BID) | ORAL | Status: DC

## 2024-03-22 MED ORDER — ACETAMINOPHEN 650 MG RE SUPP: 650.0000 mg | RECTAL | Status: DC | PRN

## 2024-03-22 MED ORDER — ASPIRIN 81 MG PO CHEW
81.0000 mg | CHEWABLE_TABLET | Freq: Every day | ORAL | Status: DC
  Administered 2024-03-23 – 2024-03-30 (×7): 81 mg
  Filled 2024-03-22 (×7): qty 1

## 2024-03-22 MED ORDER — SEVELAMER CARBONATE 2.4 G PO PACK
2.4000 g | PACK | Freq: Three times a day (TID) | ORAL | Status: DC
  Administered 2024-03-23 – 2024-03-30 (×19): 2.4 g
  Filled 2024-03-22 (×26): qty 1

## 2024-03-22 MED ORDER — PREDNISONE 5 MG PO TABS: 15.0000 mg | ORAL_TABLET | Freq: Every day | ORAL | Status: DC

## 2024-03-22 MED ORDER — CALCIUM GLUCONATE-NACL 1-0.675 GM/50ML-% IV SOLN
1.0000 g | INTRAVENOUS | Status: AC
  Administered 2024-03-22: 1000 mg via INTRAVENOUS
  Filled 2024-03-22: qty 50

## 2024-03-22 MED ORDER — ANTICOAGULANT SODIUM CITRATE 4% (200MG/5ML) IV SOLN
5.0000 mL | Status: DC | PRN
Start: 2024-03-22 — End: 2024-03-24

## 2024-03-22 MED ORDER — NEPRO/CARBSTEADY PO LIQD: 237.0000 mL | ORAL | Status: DC | PRN

## 2024-03-22 MED ORDER — RENA-VITE PO TABS
1.0000 | ORAL_TABLET | Freq: Every day | ORAL | Status: DC
  Administered 2024-03-23 – 2024-03-29 (×6): 1
  Filled 2024-03-22 (×6): qty 1

## 2024-03-22 MED ORDER — LIDOCAINE HCL (PF) 1 % IJ SOLN: 5.0000 mL | INTRAMUSCULAR | Status: DC | PRN

## 2024-03-22 NOTE — Progress Notes (Signed)
 Patient refusing HD. [facility] services notified

## 2024-03-22 NOTE — Progress Notes (Signed)
 [facility] Progress Note  Subjective:  Last HD on 7/16 with 1.5 kg UF per HD RN chart note (didn't get HD on 7/18).  I confirmed with HD unit that her treatment was rolled over to today and that she's on the list.  She hasn't gotten amlodipine  on 7/17 or 7/18.  Per conversation with attending on 7/18, DHT was placed for enteric access.  Unsure if med was held awaiting HD.  HTN today.   Review of systems:    Denies shortness of breath  She has some abdominal pain around the site of her new PEG tube - better No nausea today    Vitals:   03/22/24 0323 03/22/24 0500 03/22/24 0753 03/22/24 0902  BP:    (!) 209/88  Pulse:      Resp:    13  Temp: 99 F (37.2 C)  99.1 F (37.3 C)   TempSrc: Oral  Oral   SpO2:      Weight:  51.6 kg    Height:        Physical Exam:       General adult female in bed in NAD but uncomfortable  HEENT normocephalic atraumatic extraocular movements intact sclera anicteric Neck supple trachea midline Lungs clear to auscultation bilaterally normal work of breathing at rest; on room air Heart S1S2 tachycardic no rub Abdomen soft nontender nondistended Extremities no edema  Psych normal mood and affect Access RUE AVF with bruit and thrill; aneurysmal region    OP HD:  MWF AF 3h  B400   57.2kg  2K   AVF  Heparin  none - Hectoral 4mcg IV q HD - Mircera 150mcg IV q 2 weeks (last given 6/9 -> due 6/23)   Assessment/Plan: Intracerebral hemorrhage: C/b hydrocephalus, 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 TCDs with evidence of vasospasm 02/11/24. Followed closely by neurosurgery, evd removed 6/30 CVA: New CVA found on 7/1 ESRD:  HD per MWF schedule usually. HD today off schedule as her treatment was moved to today due to patient volume/staffing    S/p CRRT 6/12-6/15, then back on iHD.     Hyperkalemia: resolved w/ TF changed to Nepro Volume: no vol excess on exam. Note she is below her EDW.  HTN:  optimize volume with HD.  Started  back amlodipine  at 5 mg daily on 7/15 and increased to 10 mg nightly on 7/17 (evening dosing preferred due to variable HD schedule).  Note that her amlodipine  was not given on 7/17 or 7/18 as no enteric access   Once able to give PO meds again would add coreg  back (orders placed) Anemia of ESRD: increased aranesp  to 100 mcg every Friday. Iron  adequate Secondary HPTH: Ca and Phos high, VDRA stopped 6/21, added sevelamer  via tube. updated intact PTH is 103 Pancreas/ kidney transplant (2014): for the functioning pancreas pt is getting her home dosing of CyA 225mg  qam and 200 mg qpm + Cellcept  and prednisone . CyA level 7/09 was 109 (goal 100-200) which is in range.   Team is getting enteric access again - transplant meds would be essential   Disposition - per primary team.  Team states that plan likely for LTAC once more permanent enteric access obtained    Recent Labs  Lab 03/21/24 0412 03/22/24 0421  HGB 9.0* 8.9*  ALBUMIN 2.6* 2.5*  CALCIUM  10.0 9.8  PHOS 4.6 5.3*  CREATININE 5.22* 6.73*  K 4.2 4.7   No results for input(s): IRON , TIBC, FERRITIN in the last 168 hours. Inpatient  medications:  amLODipine   10 mg Oral QPM   aspirin   81 mg Oral Daily   Chlorhexidine  Gluconate Cloth  6 each Topical Q0600   Chlorhexidine  Gluconate Cloth  6 each Topical Q0600   Chlorhexidine  Gluconate Cloth  6 each Topical Q0600   cycloSPORINE   200 mg Oral QHS   cycloSPORINE   225 mg Oral Daily   darbepoetin (ARANESP ) injection - DIALYSIS  100 mcg Subcutaneous Q Fri-1800   feeding supplement  237 mL Oral BID BM   feeding supplement (NEPRO CARB STEADY)  1,000 mL Per Tube Q24H   heparin  injection (subcutaneous)  5,000 Units Subcutaneous Q8H   insulin  aspart  0-9 Units Subcutaneous Q4H   lacosamide   100 mg Oral BID   metoCLOPramide  (REGLAN ) injection  5 mg Intravenous Q8H   multivitamin  1 tablet Oral QHS   mycophenolate   500 mg Oral BID   predniSONE   15 mg Oral Q breakfast   sevelamer  carbonate  2.4  g Oral TID WC   sodium chloride  flush  3 mL Intravenous Q12H     acetaminophen  **OR** acetaminophen , albuterol , artificial tears, atropine , butalbital -acetaminophen -caffeine , Gerhardt's butt cream, hydrALAZINE , HYDROmorphone  (DILAUDID ) injection, iohexol , labetalol , loperamide  HCl, naLOXone  (NARCAN )  injection, nitroGLYCERIN , ondansetron  (ZOFRAN ) IV, mouth rinse, prochlorperazine , simethicone     Katheryn JAYSON Saba, MD 9:18 AM 03/22/2024

## 2024-03-22 NOTE — Plan of Care (Addendum)
 Hyperkalemia - Slightly elevated potassium level 5.3.  Patient already on NovoLog  every 4 hours.  Ordered calcium  gluconate 1 g and Lokelma  10 g. Update, RN reported that patient does not have access to the tube feed.  Heart current rate [employer] log and G-tube is unable to use. In that case unable to give the 1 L treating with calcium  gluconate.    Maysen Bonsignore, MD [name] 03/22/2024, 8:56 PM

## 2024-03-22 NOTE — Progress Notes (Signed)
 IV team called to bedside to evaluate pt for additional vascular access. No additional access needed at this time; to return for additional needs.

## 2024-03-22 NOTE — Progress Notes (Signed)
 [name], is a 43 y.o. 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 Hypertensive in ED SBP 240s. CT scan suggested colitis. Patient's mentation continued to worsen. CT head was subsequently done which revealed intracranial hemorrhage. Patient was admitted to the ICU.   Significant Hospital Events:  6/11 ED w n/v AMS. Abx IVF for colitis 6/12 Admit. AMS decr LOC. Incr O2 req. PCCM consult and code stroke called seen w rapid response nephro NP then neuro  6/13 Intubated with EVD, responding to commands  6/14 Extubated 6/17 acute L cerebellar infarct. Keppra  dc  6/18 Dialysis, fevering, EVD at 10 (129 mL last 24 hrs) 6/19 Narcotic dependence a driving factor. +Vasospasm on TCD. CTA with evolving Left cerebellar infarct, no HT or mass effect, substantially decreased caliber of multiple intracranial arteries and bilateral [place] branches (especially: Distal left V4, supraclinoid ICAs, MCA and ACA origins), appearance c/w widespread Vasospasm, Vasoconstriction. No associated large vessel occlusion. 6/21 CSF sampline from EVD -- WBC 244 RBC 19000, cx ngtd  6/25: diagnostic angio today with nsgy - negative 6/26 evd leaking when clamped  6/30 evd removed, HD overnight, HTN improved, 7 days cefazolin  complete 7/1 more lethargic> minimal response to narcan  x2, oxy stopped, EEG neg, MRI - new small left parietal cva, cortrak placed postpyloric, vagal episodes with N/V 7/2 iHD, vimpat  started 7/9 she appears agitated.  IVs were  removed.  LP to be initiated to evaluate her opening pressure and to ensure that she does not have meningitis if this is still pending to be done. 7/10 LP done at bedside by Neurosurgery. Cortrak was removed yesterday evening and replaced by Fluoro team today.  7/11 Cortrak obstructed and reomved and had to be replaced by Flouro. Calorie Count initiated. Undergoing Dialysis today  7/12: Having chest discomfort. Palliative Care Consulted for GOC Discussion 7/14: Agreeable to J-tube with venting G-tube. IR consulted. SLP continues to recommend Thin (Full) Liquids 7/16: G-tube placed  Assessment & Plan    Principal Problem:   SAH (subarachnoid hemorrhage) (HCC), ICH with brain compression, communicating hydrocephalus - seen by neurology and neurosurgery s/p right frontal pressure ventriculostomy via burr hole and EVD was removed on 6/30. -MRI brain showed small foci of hemorrhage in the posterior left cerebellum without evidence for acute infarct. Punctate focus of acute infarct involving the medial left occipital cortex, evolving subacute infarct in the left parietal lobe, trace blood products.  No change in management per neurology and neurosurgery. - Due to concern for hydrocephalus, neurosurgery did LP, spinal fluid pressures were low and ruled out meningitis.  Per  Dr. Mavis, she would not benefit from a VP shunt  -Palliative medicine following for GOC.  Oropharyngeal Dysphagia: History of severe gastroparesis with nausea and vomiting - Mentation has improved, placed on full liquid diet however failed calorie count, has nausea vomiting due to gastroparesis.  - Dietitians recommended J-tube w/venting G-tube.  IR was consulted, however G-tube was placed on 7/16.  Dietitian recommended if it can be converted to J tube due to significant gastroparesis. Fluoroscopy guided post pyloric NG tube placed by IR - Continue Reglan  5 mg IV every 8 hours - Patient continues to complain of pain around the  gastrostomy tube, continue pain control - IR has been following, plan is to convert to GJ tube after tract heals in about 4 weeks for jejunal feeds and gastric venting - Resume full liquid diet  Concern for seizure activity: - Evaluated by neurology, underwent EEG.  - Continue with Vimpat , seizure precautions   Vasospasm with left cerebellar infarct:  -Seen by Neurology.  Noted to be on aspirin . -LDL 63.  HbA1c 4.2. - PT evaluation recommended SNF, patient will benefit in the LTAC/select facility given hemodialysis and ongoing significant medical issues.    Essential Hypertension: - Midodrine  has been discontinued.  - Continue amlodipine , Coreg  p.o. - IV hydralazine , labetalol  as needed    Chest Pain - Atypical, 2D echo showed EF normal 60 to 65%, with no regional WMA, mild LVH -Continue pain control as needed   ESRD on hemodialysis, MWF, hyperkalemia - Management per nephrology - chronically immunosuppressed with CellCept , Cyclosporine , and Prednisone     Bradycardia:  -Secondary to vagal response. Resolved.   Abdominal Discomfort and Chest Discomfort:  - Continue pain control as needed     Colitis: Stable.     Status post Pancreatic Transplant/Diabetes mellitus  CBG (last 3)  Recent Labs    03/21/24 2320 03/22/24 0323 03/22/24 0744  GLUCAP 96 88 91   Continue sensitive sliding scale insulin    Anemia of Chronic Disease H&H stable  Moderate protein calorie malnutrition, underweight Nutrition Problem: Moderate Malnutrition Etiology: chronic illness (ESRD on HD/DM) Signs/Symptoms: mild fat depletion, mild muscle depletion Interventions: Tube feeding  Estimated body mass index is 17.55 kg/m as calculated from the following:   Height as of this encounter: 5' 7.5 (1.715 m).   Weight as of this encounter: 51.6 kg.  Code Status:  DVT Prophylaxis:  heparin  injection 5,000 Units Start: 03/14/24 0600 SCDs Start: 02/14/24 0725   Level of Care: Level of care:  Progressive Family Communication: Updated patient Disposition Plan:      Remains inpatient appropriate: TBD   Procedures:    Consultants:   Neurosurgery Nephrology IR  Antimicrobials:   Anti-infectives (From admission, onward)    Start     Dose/Rate Route Frequency Ordered Stop   03/20/24 0845  ceFAZolin  (ANCEF ) IVPB 2g/100 mL premix        over 30 Minutes Intravenous Continuous PRN 03/20/24 0859 03/20/24 0845   02/27/24 1200  vancomycin  (VANCOREADY) IVPB 500 mg/100 mL  Status:  Discontinued        500 mg 100 mL/hr over 60 Minutes Intravenous Every M-W-F (Hemodialysis) 02/25/24 1129 02/25/24 1436   02/26/24 2200  ceFAZolin  (ANCEF ) IVPB 1 g/50 mL premix        1 g 100 mL/hr over 30 Minutes Intravenous Every 24 hours 02/26/24 1009 03/03/24 2140   02/25/24 1530  vancomycin  (VANCOREADY) IVPB 500 mg/100 mL        500 mg 100 mL/hr over 60  Minutes Intravenous Every M-W-F (Hemodialysis) 02/25/24 1436 02/25/24 1934   02/23/24 1800  meropenem  (MERREM ) 1 g in sodium chloride  0.9 % 100 mL IVPB  Status:  Discontinued        1 g 200 mL/hr over 30 Minutes Intravenous Every 24 hours 02/23/24 1625 02/26/24 1009   02/23/24 1800  vancomycin  (VANCOREADY) IVPB 1250 mg/250 mL        1,250 mg 166.7 mL/hr over 90 Minutes Intravenous  Once 02/23/24 1625 02/23/24 1933   02/17/24 2200  piperacillin -tazobactam (ZOSYN ) IVPB 2.25 g        2.25 g 100 mL/hr over 30 Minutes Intravenous Every 8 hours 02/17/24 1412 02/21/24 2336   02/14/24 1800  vancomycin  (VANCOREADY) IVPB 2000 mg/400 mL        2,000 mg 200 mL/hr over 120 Minutes Intravenous  Once 02/14/24 1709 02/14/24 2048   02/14/24 1622  piperacillin -tazobactam (ZOSYN ) IVPB 3.375 g  Status:  Discontinued        3.375 g 100 mL/hr over 30 Minutes Intravenous Every 6 hours 02/14/24 1622 02/17/24 1412   02/14/24 1545  piperacillin -tazobactam (ZOSYN ) IVPB 3.375 g  Status:  Discontinued        3.375 g 12.5 mL/hr over 240 Minutes Intravenous Every 8 hours  02/14/24 1451 02/14/24 1453   02/14/24 1545  piperacillin -tazobactam (ZOSYN ) IVPB 2.25 g  Status:  Discontinued        2.25 g 100 mL/hr over 30 Minutes Intravenous Every 8 hours 02/14/24 1453 02/14/24 1622   02/14/24 0800  cefTRIAXone  (ROCEPHIN ) 2 g in sodium chloride  0.9 % 100 mL IVPB  Status:  Discontinued        2 g 200 mL/hr over 30 Minutes Intravenous Every 24 hours 02/14/24 0730 02/14/24 1451   02/14/24 0800  metroNIDAZOLE  (FLAGYL ) IVPB 500 mg  Status:  Discontinued        500 mg 100 mL/hr over 60 Minutes Intravenous Every 12 hours 02/14/24 0730 02/14/24 1451          Medications  amLODipine   10 mg Oral QPM   aspirin   81 mg Oral Daily   carvedilol   12.5 mg Oral BID WC   Chlorhexidine  Gluconate Cloth  6 each Topical Q0600   Chlorhexidine  Gluconate Cloth  6 each Topical Q0600   Chlorhexidine  Gluconate Cloth  6 each Topical Q0600   cycloSPORINE   200 mg Oral QHS   cycloSPORINE   225 mg Oral Daily   darbepoetin (ARANESP ) injection - DIALYSIS  100 mcg Subcutaneous Q Fri-1800   feeding supplement  237 mL Oral BID BM   feeding supplement (NEPRO CARB STEADY)  1,000 mL Per Tube Q24H   heparin  injection (subcutaneous)  5,000 Units Subcutaneous Q8H   insulin  aspart  0-9 Units Subcutaneous Q4H   lacosamide   100 mg Oral BID   metoCLOPramide  (REGLAN ) injection  5 mg Intravenous Q8H   multivitamin  1 tablet Oral QHS   mycophenolate   500 mg Oral BID   predniSONE   15 mg Oral Q breakfast   sevelamer  carbonate  2.4 g Oral TID WC   sodium chloride  flush  3 mL Intravenous Q12H      Subjective:   [name] [name] was seen and examined today.  Continues to complain of pain around the gastrostomy site.  No fever chills, nausea or vomiting.  Objective:   Vitals:   03/22/24 0500 03/22/24 0753 03/22/24 0902 03/22/24 0931  BP:   (!) 209/88 (!) 187/83  Pulse:    (!) 108  Resp:  13 16  Temp:  99.1 F (37.3 C) 97.6 F (36.4 C)   TempSrc:  Oral Oral   SpO2:   97% 97%  Weight: 51.6 kg      Height:        Intake/Output Summary (Last 24 hours) at 03/22/2024 1049 Last data filed at 03/21/2024 2147 Gross per 24 hour  Intake 3 ml  Output 1600 ml  Net -1597 ml     Wt Readings from Last 3 Encounters:  03/22/24 51.6 kg  11/07/23 57.3 kg  09/26/23 56.7 kg   Physical Exam General: Alert and oriented x 3, NAD, uncomfortable and ill-appearing Cardiovascular: S1 S2 clear, RRR.  Respiratory: CTAB, no wheezing, rales or rhonchi Gastrointestinal: Soft, PEG tube+ Ext: no pedal edema bilaterally, RUE AV fistula Neuro: no new deficits Psych: Uncomfortable   Data Reviewed:  I have personally reviewed following labs    CBC Lab Results  Component Value Date   WBC 4.9 03/22/2024   RBC 2.79 (L) 03/22/2024   HGB 8.9 (L) 03/22/2024   HCT 27.3 (L) 03/22/2024   MCV 97.8 03/22/2024   MCH 31.9 03/22/2024   PLT 220 03/22/2024   MCHC 32.6 03/22/2024   RDW 15.3 03/22/2024   LYMPHSABS 1.3 03/19/2024   MONOABS 0.5 03/19/2024   EOSABS 0.1 03/19/2024   BASOSABS 0.0 03/19/2024     Last metabolic panel Lab Results  Component Value Date   NA 134 (L) 03/22/2024   K 4.7 03/22/2024   CL 95 (L) 03/22/2024   CO2 24 03/22/2024   BUN 37 (H) 03/22/2024   CREATININE 6.73 (H) 03/22/2024   GLUCOSE 84 03/22/2024   GFRNONAA 7 (L) 03/22/2024   GFRAA  02/02/2022    PATIENT IDENTIFICATION ERROR. PLEASE DISREGARD RESULTS. ACCOUNT WILL BE CREDITED.   CALCIUM  9.8 03/22/2024   PHOS 5.3 (H) 03/22/2024   PROT 6.3 (L) 03/19/2024   ALBUMIN 2.5 (L) 03/22/2024   BILITOT 0.5 03/19/2024   ALKPHOS 111 03/19/2024   AST 20 03/19/2024   ALT 11 03/19/2024   ANIONGAP 15 03/22/2024    CBG (last 3)  Recent Labs    03/21/24 2320 03/22/24 0323 03/22/24 0744  GLUCAP 96 88 91      Coagulation Profile: No results for input(s): INR, PROTIME in the last 168 hours.   Radiology Studies: I have personally reviewed the imaging studies  DG Naso G Tube Plc W/Fl W/Rad Result Date:  03/21/2024 INDICATION: Patient is in the hospital with complications from hydrocephalus, ICH. Severe gastroparesis. Patient had G tube placed earlier today and patient pulled her prior post pyloric NG tube after. Unsuccessful post pyloric NG tube placement yesterday. Consult for re-attempt today. MEDICATIONS: 4 ml 2% viscous lidocaine .  35 mL Omnipaque  300 ANESTHESIA/SEDATION: None CONTRAST:  35 mL Omnipaque  300 - administered into the stomach and proximal duodenum. FLUOROSCOPY: Radiation Exposure Index (as provided by the fluoroscopic device): 101.50 mGy Kerma COMPLICATIONS: None immediate. PROCEDURE: Informed written consent was obtained after a thorough discussion of the procedural risks, benefits and alternatives. All questions were addressed. Maximal Sterile Barrier Technique was utilized including caps, mask, sterile gowns, sterile gloves, hand hygiene and skin antiseptic. A timeout was performed prior to the initiation of the procedure. Nasogastric tube remained in the stomach advanced to the level of the pylorus from placement attempt yesterday. Viscous lidocaine  was initially given intranasally for pain relief while manipulating NG tube. Guidewire was introduced through the NG tube and was able to be advanced through the pylorus into the duodenum  after multiple attempts. The NG tube was advanced over the guidewire into this space. Contrast was then administered with contrast seen traveling through the proximal small bowel showing successful post-pyloric placement in the lower descending duodenum. Guidewire was removed and NG tube was secured in place. Images saved. IMPRESSION: Successful post-pyloric nasogastric tube placement with tip in the lower descending duodenum. Performed by: Wyatt Pommier, PA-C Supervised by: Selinda Blue, MD Electronically Signed   By: Selinda DELENA Blue M.D.   On: 03/21/2024 12:51   DG [place] MATSU Tube Plc W/Fl W/Rad Result Date: 03/21/2024 INDICATION: Patient is in the hospital with  complications from hydrocephalus, ICH. Significant gastroparesis. Patient had G tube placed earlier today and patient pulled her prior post pyloric NG tube after. IR consulted to re-attempt placement of post pyloric NG tube. MEDICATIONS: 10 ml 2% viscous lidocaine .  50 mL Omnipaque  300; ANESTHESIA/SEDATION: None CONTRAST:  50 mL Omnipaque  300 - administered into the stomach FLUOROSCOPY: Radiation Exposure Index (as provided by the fluoroscopic device): 134.60 mGy Kerma COMPLICATIONS: None immediate. PROCEDURE: Informed written consent was obtained after a thorough discussion of the procedural risks, benefits and alternatives. All questions were addressed. Maximal Sterile Barrier Technique was utilized including caps, mask, sterile gowns, sterile gloves, sterile drape, hand hygiene and skin antiseptic. A timeout was performed prior to the initiation of the procedure. IR tech had advanced a 10 Fr NG tube into the stomach upon my arrival to the room. Multiple attempts at advancing the guidewire and nasogastric tube were made. Contrast was administered and showed a significantly curved transition from the pylorus to the duodenum, further complicating passage of the guidewire and NG tube. Patient was moved to multiple positions and both air and 50 mL of water  was given to try and open the pylorus. Ultimately unsuccessful at passing the wire or nasogastric tube into the post pyloric space. NG tube was left in the stomach at the level of the pylorus. IMPRESSION: *Unsuccessful placement of post-pyloric nasogastric tube. The nasogastric tube was left in the stomach at the level of pylorus. Performed by: Wyatt Pommier, PA-C Electronically Signed   By: Ree Molt M.D.   On: 03/21/2024 11:23       Schyler Counsell M.D. [name] 03/22/2024, 10:49 AM  Available via Epic secure chat 7am-7pm After 7 pm, please refer to night coverage provider listed on amion.

## 2024-03-22 NOTE — Progress Notes (Signed)
 Patient's TF have been held as her cortrak is clogged and unsure if PEG is ok to use. Clarified with Dr. Davia that PEG was ok to use for meds. Gave morning meds via PEG and within a couple minutes, patient vomited x1 clear fluid. Per Dr. Davia, switching meds to IV and IR to assess and troubleshoot cortrak tomorrow (7/20).

## 2024-03-22 NOTE — Plan of Care (Signed)
 Problem: Clinical Measurements: Goal: Ability to maintain clinical measurements within normal limits will improve 03/22/2024 0439 by Marvis Kenneth SAILOR, RN Outcome: Progressing 03/21/2024 2007 by Marvis Kenneth SAILOR, RN Outcome: Progressing Goal: Will remain free from infection 03/22/2024 0439 by Marvis Kenneth SAILOR, RN Outcome: Progressing 03/21/2024 2007 by Marvis Kenneth SAILOR, RN Outcome: Progressing Goal: Diagnostic test results will improve 03/22/2024 0439 by Marvis Kenneth SAILOR, RN Outcome: Progressing 03/21/2024 2007 by Marvis Kenneth SAILOR, RN Outcome: Progressing Goal: Respiratory complications will improve 03/22/2024 0439 by Marvis Kenneth SAILOR, RN Outcome: Progressing 03/21/2024 2007 by Marvis Kenneth SAILOR, RN Outcome: Progressing   Problem: Activity: Goal: Risk for activity intolerance will decrease 03/22/2024 0439 by Marvis Kenneth SAILOR, RN Outcome: Progressing 03/21/2024 2007 by Marvis Kenneth SAILOR, RN Outcome: Progressing   Problem: Nutrition: Goal: Adequate nutrition will be maintained 03/22/2024 0439 by Marvis Kenneth SAILOR, RN Outcome: Progressing 03/21/2024 2007 by Marvis Kenneth SAILOR, RN Outcome: Progressing   Problem: Coping: Goal: Level of anxiety will decrease 03/22/2024 0439 by Marvis Kenneth SAILOR, RN Outcome: Progressing 03/21/2024 2007 by Marvis Kenneth SAILOR, RN Outcome: Progressing   Problem: Elimination: Goal: Will not experience complications related to bowel motility 03/22/2024 0439 by Marvis Kenneth SAILOR, RN Outcome: Progressing 03/21/2024 2007 by Marvis Kenneth SAILOR, RN Outcome: Progressing   Problem: Pain Managment: Goal: General experience of comfort will improve and/or be controlled 03/22/2024 0439 by Marvis Kenneth SAILOR, RN Outcome: Progressing 03/21/2024 2007 by Marvis Kenneth SAILOR, RN Outcome: Progressing   Problem: Safety: Goal: Ability to remain free from injury will improve 03/22/2024 0439 by Marvis Kenneth SAILOR, RN Outcome: Progressing 03/21/2024 2007  by Marvis Kenneth SAILOR, RN Outcome: Progressing   Problem: Skin Integrity: Goal: Risk for impaired skin integrity will decrease 03/22/2024 0439 by Marvis Kenneth SAILOR, RN Outcome: Progressing 03/21/2024 2007 by Marvis Kenneth SAILOR, RN Outcome: Progressing   Problem: Coping: Goal: Ability to adjust to condition or change in health will improve 03/22/2024 0439 by Marvis Kenneth SAILOR, RN Outcome: Progressing 03/21/2024 2007 by Marvis Kenneth SAILOR, RN Outcome: Progressing   Problem: Metabolic: Goal: Ability to maintain appropriate glucose levels will improve 03/22/2024 0439 by Marvis Kenneth SAILOR, RN Outcome: Progressing 03/21/2024 2007 by Marvis Kenneth SAILOR, RN Outcome: Progressing   Problem: Nutritional: Goal: Maintenance of adequate nutrition will improve 03/22/2024 0439 by Marvis Kenneth SAILOR, RN Outcome: Progressing 03/21/2024 2007 by Marvis Kenneth SAILOR, RN Outcome: Progressing Goal: Progress toward achieving an optimal weight will improve 03/22/2024 0439 by Marvis Kenneth SAILOR, RN Outcome: Progressing 03/21/2024 2007 by Marvis Kenneth SAILOR, RN Outcome: Progressing   Problem: Skin Integrity: Goal: Risk for impaired skin integrity will decrease 03/22/2024 0439 by Marvis Kenneth SAILOR, RN Outcome: Progressing 03/21/2024 2007 by Marvis Kenneth SAILOR, RN Outcome: Progressing   Problem: Education: Goal: Knowledge of secondary prevention will improve (MUST DOCUMENT ALL) 03/22/2024 0439 by Marvis Kenneth SAILOR, RN Outcome: Progressing 03/21/2024 2007 by Marvis Kenneth SAILOR, RN Outcome: Progressing Goal: Knowledge of patient specific risk factors will improve (DELETE if not current risk factor) 03/22/2024 0439 by Marvis Kenneth SAILOR, RN Outcome: Progressing 03/21/2024 2007 by Marvis Kenneth SAILOR, RN Outcome: Progressing   Problem: Intracerebral Hemorrhage Tissue Perfusion: Goal: Complications of Intracerebral Hemorrhage will be minimized 03/22/2024 0439 by Marvis Kenneth SAILOR, RN Outcome:  Progressing 03/21/2024 2007 by Marvis Kenneth SAILOR, RN Outcome: Progressing   Problem: Health Behavior/Discharge Planning: Goal: Goals will be collaboratively established with patient/family 03/22/2024 0439 by Marvis Kenneth SAILOR, RN Outcome: Progressing 03/21/2024 2007 by Marvis Kenneth SAILOR, RN Outcome: Progressing  Problem: Self-Care: Goal: Ability to communicate needs accurately will improve 03/22/2024 0439 by Marvis Kenneth SAILOR, RN Outcome: Progressing 03/21/2024 2007 by Marvis Kenneth SAILOR, RN Outcome: Progressing   Problem: Nutrition: Goal: Risk of aspiration will decrease 03/22/2024 0439 by Marvis Kenneth SAILOR, RN Outcome: Progressing 03/21/2024 2007 by Marvis Kenneth SAILOR, RN Outcome: Progressing Goal: Dietary intake will improve 03/22/2024 0439 by Marvis Kenneth SAILOR, RN Outcome: Progressing 03/21/2024 2007 by Marvis Kenneth SAILOR, RN Outcome: Progressing

## 2024-03-22 NOTE — Progress Notes (Signed)
 Pt cortrak clogged Every effort to open the clogged tube has been abortive.. MD notified.

## 2024-03-22 NOTE — Plan of Care (Addendum)
 Patient's RN reported that she is refusing dialysis stating that she does not want to go to dialysis anymore. Need to discuss further goal of care to regarding to pursue future dialysis and nephrology team in the daytime.  Consulted palliative team to discuss further goals of care with the patient as well.  Deshawna Mcneece, MD [name] 03/22/2024, 7:59 PM

## 2024-03-23 ENCOUNTER — Inpatient Hospital Stay (HOSPITAL_COMMUNITY)

## 2024-03-23 DIAGNOSIS — I609 Nontraumatic subarachnoid hemorrhage, unspecified: Secondary | ICD-10-CM | POA: Diagnosis not present

## 2024-03-23 DIAGNOSIS — R112 Nausea with vomiting, unspecified: Secondary | ICD-10-CM | POA: Diagnosis not present

## 2024-03-23 DIAGNOSIS — Z4659 Encounter for fitting and adjustment of other gastrointestinal appliance and device: Secondary | ICD-10-CM | POA: Diagnosis not present

## 2024-03-23 DIAGNOSIS — N186 End stage renal disease: Secondary | ICD-10-CM | POA: Diagnosis not present

## 2024-03-23 LAB — GLUCOSE, CAPILLARY
Glucose-Capillary: 110 mg/dL — ABNORMAL HIGH (ref 70–99)
Glucose-Capillary: 101 mg/dL — ABNORMAL HIGH (ref 70–99)
Glucose-Capillary: 101 mg/dL — ABNORMAL HIGH (ref 70–99)
Glucose-Capillary: 140 mg/dL — ABNORMAL HIGH (ref 70–99)
Glucose-Capillary: 113 mg/dL — ABNORMAL HIGH (ref 70–99)
Glucose-Capillary: 106 mg/dL — ABNORMAL HIGH (ref 70–99)

## 2024-03-23 LAB — RENAL FUNCTION PANEL
Albumin: 2.4 g/dL — ABNORMAL LOW (ref 3.5–5.0)
Anion gap: 15 (ref 5–15)
BUN: 45 mg/dL — ABNORMAL HIGH (ref 6–20)
CO2: 23 mmol/L (ref 22–32)
Calcium: 9.8 mg/dL (ref 8.9–10.3)
Chloride: 92 mmol/L — ABNORMAL LOW (ref 98–111)
Creatinine, Ser: 7.73 mg/dL — ABNORMAL HIGH (ref 0.44–1.00)
GFR, Estimated: 6 mL/min — ABNORMAL LOW (ref >60)
Glucose, Bld: 112 mg/dL — ABNORMAL HIGH (ref 70–99)
Phosphorus: 6 mg/dL — ABNORMAL HIGH (ref 2.5–4.6)
Potassium: 4.6 mmol/L (ref 3.5–5.1)
Sodium: 130 mmol/L — ABNORMAL LOW (ref 135–145)

## 2024-03-23 LAB — CBC
HCT: 26 % — ABNORMAL LOW (ref 36.0–46.0)
Hemoglobin: 8.5 g/dL — ABNORMAL LOW (ref 12.0–15.0)
MCH: 31.4 pg (ref 26.0–34.0)
MCHC: 32.7 g/dL (ref 30.0–36.0)
MCV: 95.9 fL (ref 80.0–100.0)
Platelets: 203 K/uL (ref 150–400)
RBC: 2.71 MIL/uL — ABNORMAL LOW (ref 3.87–5.11)
RDW: 15.1 % (ref 11.5–15.5)
WBC: 3.9 K/uL — ABNORMAL LOW (ref 4.0–10.5)
nRBC: 0 % (ref 0.0–0.2)

## 2024-03-23 LAB — POTASSIUM
Potassium: 4.9 mmol/L (ref 3.5–5.1)
Potassium: 4.9 mmol/L (ref 3.5–5.1)
Potassium: 5 mmol/L (ref 3.5–5.1)
Potassium: 5 mmol/L (ref 3.5–5.1)
Potassium: 5.2 mmol/L — ABNORMAL HIGH (ref 3.5–5.1)

## 2024-03-23 LAB — HEPATITIS B SURFACE ANTIBODY, QUANTITATIVE: Hep B S AB Quant (Post): 136 m[IU]/mL

## 2024-03-23 MED ORDER — LIDOCAINE VISCOUS HCL 2 % MT SOLN
15.0000 mL | Freq: Once | OROMUCOSAL | Status: AC
  Administered 2024-03-23: 3 mL via OROMUCOSAL

## 2024-03-23 MED ORDER — CLONIDINE HCL 0.1 MG/24HR TD PTWK
0.1000 mg | MEDICATED_PATCH | TRANSDERMAL | Status: DC
  Administered 2024-03-23: 0.1 mg via TRANSDERMAL
  Filled 2024-03-23: qty 1

## 2024-03-23 MED ORDER — CHLORHEXIDINE GLUCONATE CLOTH 2 % EX PADS
6.0000 | MEDICATED_PAD | Freq: Every day | CUTANEOUS | Status: DC
  Administered 2024-03-23 – 2024-03-25 (×3): 6 via TOPICAL

## 2024-03-23 MED ORDER — IOHEXOL 300 MG/ML  SOLN
50.0000 mL | Freq: Once | INTRAMUSCULAR | Status: AC | PRN
  Administered 2024-03-23: 15 mL

## 2024-03-23 MED ORDER — MYCOPHENOLATE 200 MG/ML ORAL SUSPENSION
500.0000 mg | Freq: Two times a day (BID) | ORAL | Status: DC
  Administered 2024-03-23 – 2024-03-30 (×13): 500 mg
  Filled 2024-03-23 (×15): qty 2.5

## 2024-03-23 MED ORDER — METOPROLOL TARTRATE 25 MG PO TABS
25.0000 mg | ORAL_TABLET | Freq: Two times a day (BID) | ORAL | Status: DC
  Administered 2024-03-23 – 2024-03-24 (×3): 25 mg
  Filled 2024-03-23 (×3): qty 1

## 2024-03-23 MED ORDER — CHLORHEXIDINE GLUCONATE CLOTH 2 % EX PADS
6.0000 | MEDICATED_PAD | Freq: Every day | CUTANEOUS | Status: DC
  Administered 2024-03-24 – 2024-03-25 (×2): 6 via TOPICAL

## 2024-03-23 MED ORDER — CYCLOSPORINE MODIFIED (NEORAL) 100 MG/ML PO SOLN
200.0000 mg | Freq: Every day | ORAL | Status: DC
  Administered 2024-03-23 – 2024-03-30 (×7): 200 mg
  Filled 2024-03-23 (×8): qty 2

## 2024-03-23 MED ORDER — LACOSAMIDE 50 MG PO TABS
100.0000 mg | ORAL_TABLET | Freq: Two times a day (BID) | ORAL | Status: DC
  Administered 2024-03-23 – 2024-03-27 (×7): 100 mg
  Filled 2024-03-23 (×7): qty 2

## 2024-03-23 MED ORDER — CYCLOSPORINE MODIFIED (NEORAL) 100 MG/ML PO SOLN
225.0000 mg | Freq: Every day | ORAL | Status: DC
  Administered 2024-03-23 – 2024-03-30 (×7): 225 mg
  Filled 2024-03-23: qty 2.25
  Filled 2024-03-23 (×7): qty 2.3

## 2024-03-23 MED ORDER — NEPRO/CARBSTEADY PO LIQD
1000.0000 mL | ORAL | Status: DC
  Administered 2024-03-23 – 2024-03-26 (×3): 1000 mL
  Filled 2024-03-23 (×4): qty 1000

## 2024-03-23 NOTE — Progress Notes (Addendum)
 Patient refused HD tx scheduled on 03/22/24

## 2024-03-23 NOTE — Progress Notes (Signed)
 Per IR PA, [name] [name], they were able to readjust the cortrak tube to allow it work again as it was kinked in her small bowel. Per [name], they pulled the tube back some and flushed with water  easily. A decent amount of length pulled out currently, but tube is still post pyloric. Tube should be able to be used for meds/tube feeds per IR.  Dr. Davia notified and RD evaluated and restarted on Nepro tube feeds starting at 20mL/hr, advancing 56mL/hr q6h until goal rate.  Cortrak is now flushing well and patient is tolerating meds through it.

## 2024-03-23 NOTE — Progress Notes (Addendum)
 [facility] Progress Note  Subjective:  Last HD on 7/16 with 1.5 kg UF per HD RN chart note (didn't get HD on 7/18 due to treatment rolled to 7/19 due to patient volumes).  Note she was called for HD on 7/19 at the early part of evening shift.  RN entered note late at my request to document that she refused HD on 7/19.  She states that she refused because no one would give me pain meds   She was transitioned to IV cyclosporine , IV mycophenolate , and IV solumedrol as no enteric access for meds currently.   Review of systems:     Denies shortness of breath  Nurse states that she is about to go off the floor to try and reposition her cortrak No nausea today    Vitals:   03/23/24 0342 03/23/24 0500 03/23/24 0600 03/23/24 0848  BP:   (!) 177/75 (!) 186/80  Pulse:   89 95  Resp:   13 14  Temp: 98.4 F (36.9 C)   98.8 F (37.1 C)  TempSrc: Oral   Oral  SpO2:   97% 98%  Weight:  51.8 kg    Height:        Physical Exam:        General adult female in bed in NAD but uncomfortable  HEENT normocephalic atraumatic extraocular movements intact sclera anicteric Neck supple trachea midline Lungs clear to auscultation bilaterally normal work of breathing at rest; on room air Heart S1S2 tachycardic no rub Abdomen soft nontender nondistended Extremities no edema  Psych normal mood and affect Access RUE AVF with bruit and thrill; aneurysmal region    OP HD:  MWF AF 3h  B400   57.2kg  2K   AVF  Heparin  none - Hectoral 4mcg IV q HD - Mircera 150mcg IV q 2 weeks (last given 6/9 -> due 6/23)   Assessment/Plan: Intracerebral hemorrhage: C/b hydrocephalus, 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 TCDs with evidence of vasospasm 02/11/24. Followed closely by neurosurgery, evd removed 6/30 CVA: New CVA found on 7/1 ESRD:  HD per MWF schedule usually. HD tonight if able.  If she runs tonight, then transition to TTS schedule this week and back to MWF schedule after that    S/p CRRT 6/12-6/15, then back on iHD.     Hyperkalemia: resolved w/ TF changed to Nepro Volume: no vol excess on exam. Note she is below her EDW.  HTN:  optimize volume with HD.  Started back amlodipine  at 5 mg daily on 7/15 and increased to 10 mg nightly on 7/17 (evening dosing preferred due to variable HD schedule).  Note that her amlodipine  was not given on 7/17 or 7/18 as no enteric access. Anemia of ESRD: increased aranesp  to 100 mcg every Friday. Iron  adequate Secondary HPTH: Ca and Phos high, VDRA stopped 6/21, added sevelamer  via tube. 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 CyA level 7/09 was 109 (goal 100-200) which is in range.   Team is getting enteric access again - transplant meds would be essential.  She was temporarily converted to IV meds for immunosuppression   Disposition - per primary team.  Team states that plan likely for LTAC once more permanent enteric access obtained    Recent Labs  Lab 03/22/24 0421 03/22/24 2013 03/23/24 0026 03/23/24 0533 03/23/24 0540  HGB 8.9*  --   --   --  8.5*  ALBUMIN 2.5*  --   --  2.4*  --   CALCIUM  9.8 9.9  --  9.8  --   PHOS 5.3*  --   --  6.0*  --   CREATININE 6.73* 7.67*  --  7.73*  --   K 4.7 5.3* 5.2* 4.6  --    No results for input(s): IRON , TIBC, FERRITIN in the last 168 hours. Inpatient medications:  aspirin   81 mg Per Tube Daily   Chlorhexidine  Gluconate Cloth  6 each Topical Q0600   darbepoetin (ARANESP ) injection - DIALYSIS  100 mcg Subcutaneous Q Fri-1800   feeding supplement  237 mL Per Tube BID BM   feeding supplement (NEPRO CARB STEADY)  1,000 mL Per Tube Q24H   heparin  injection (subcutaneous)  5,000 Units Subcutaneous Q8H   insulin  aspart  0-9 Units Subcutaneous Q4H   methylPREDNISolone  (SOLU-MEDROL ) injection  12 mg Intravenous Daily   metoCLOPramide  (REGLAN ) injection  5 mg  Intravenous Q8H   metoprolol  tartrate  5 mg Intravenous Q6H   multivitamin  1 tablet Per Tube QHS   sevelamer  carbonate  2.4 g Per Tube TID WC   sodium chloride  flush  3 mL Intravenous Q12H    anticoagulant sodium citrate      cycloSPORINE  (SANDIMMUNE ) 70 mg in dextrose  5 % 250 mL IVPB 70 mg (03/22/24 2230)   lacosamide  (VIMPAT ) IV 100 mg (03/22/24 2207)   mycophenolate  (CELLCEPT ) IV 500 mg (03/23/24 0308)    acetaminophen  **OR** acetaminophen , albuterol , alteplase , anticoagulant sodium citrate , artificial tears, atropine , butalbital -acetaminophen -caffeine , feeding supplement (NEPRO CARB STEADY), Gerhardt's butt cream, heparin , hydrALAZINE , HYDROmorphone  (DILAUDID ) injection, iohexol , labetalol , lidocaine  (PF), lidocaine -prilocaine , loperamide  HCl, naLOXone  (NARCAN )  injection, nitroGLYCERIN , ondansetron  (ZOFRAN ) IV, mouth rinse, pentafluoroprop-tetrafluoroeth, prochlorperazine , simethicone     Katheryn JAYSON Saba, MD 9:18 AM 03/23/2024   Addendum - multiple issues recently with enteric access.  Spoke with primary team and they are ok with adding clonidine  patch.  Will order   Katheryn JAYSON Saba, MD 9:25 AM 03/23/2024

## 2024-03-23 NOTE — Progress Notes (Signed)
 [name], is a 43 y.o. 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 Hypertensive in ED SBP 240s. CT scan suggested colitis. Patient's mentation continued to worsen. CT head was subsequently done which revealed intracranial hemorrhage. Patient was admitted to the ICU.   Significant Hospital Events:  6/11 ED w n/v AMS. Abx IVF for colitis 6/12 Admit. AMS decr LOC. Incr O2 req. PCCM consult and code stroke called seen w rapid response nephro NP then neuro  6/13 Intubated with EVD, responding to commands  6/14 Extubated 6/17 acute L cerebellar infarct. Keppra  dc  6/18 Dialysis, fevering, EVD at 10 (129 mL last 24 hrs) 6/19 Narcotic dependence a driving factor. +Vasospasm on TCD. CTA with evolving Left cerebellar infarct, no HT or mass effect, substantially decreased caliber of multiple intracranial arteries and bilateral [place] branches (especially: Distal left V4, supraclinoid ICAs, MCA and ACA origins), appearance c/w widespread Vasospasm, Vasoconstriction. No associated large vessel occlusion. 6/21 CSF sampline from EVD -- WBC 244 RBC 19000, cx ngtd  6/25: diagnostic angio today with nsgy - negative 6/26 evd leaking when clamped  6/30 evd removed, HD overnight, HTN improved, 7 days cefazolin  complete 7/1 more lethargic> minimal response to narcan  x2, oxy stopped, EEG neg, MRI - new small left parietal cva, cortrak placed postpyloric, vagal episodes with N/V 7/2 iHD, vimpat  started 7/9 she appears agitated.  IVs were  removed.  LP to be initiated to evaluate her opening pressure and to ensure that she does not have meningitis if this is still pending to be done. 7/10 LP done at bedside by Neurosurgery. Cortrak was removed yesterday evening and replaced by Fluoro team today.  7/11 Cortrak obstructed and reomved and had to be replaced by Flouro. Calorie Count initiated. Undergoing Dialysis today  7/12: Having chest discomfort. Palliative Care Consulted for GOC Discussion 7/14: Agreeable to J-tube with venting G-tube. IR consulted. SLP continues to recommend Thin (Full) Liquids 7/16: G-tube placed  Assessment & Plan    Principal Problem:   SAH (subarachnoid hemorrhage) (HCC), ICH with brain compression, communicating hydrocephalus - seen by neurology and neurosurgery s/p right frontal pressure ventriculostomy via burr hole and EVD was removed on 6/30. -MRI brain showed small foci of hemorrhage in the posterior left cerebellum without evidence for acute infarct. Punctate focus of acute infarct involving the medial left occipital cortex, evolving subacute infarct in the left parietal lobe, trace blood products.  No change in management per neurology and neurosurgery. - Due to concern for hydrocephalus, neurosurgery did LP, spinal fluid pressures were low and ruled out meningitis.  Per  Dr. Mavis, she would not benefit from a VP shunt  -Palliative medicine following for GOC.  Oropharyngeal Dysphagia: History of severe gastroparesis with nausea and vomiting - Mentation has improved, placed on full liquid diet however failed calorie count, has nausea vomiting due to gastroparesis.  - Dietitians recommended J-tube w/venting G-tube.  IR was consulted, however G-tube was placed on 7/16.  Dietitian recommended if it can be converted to J tube due to significant gastroparesis. Fluoroscopy guided post pyloric NG tube placed by IR - Continue Reglan  5 mg IV every 8 hours - Patient continues to complain of pain around the  gastrostomy tube, continue pain control - IR has been following, plan is to convert to GJ tube after tract heals in about 4 weeks for jejunal feeds and gastric venting - Resume full liquid diet, Cortrack readjusted by IR.  Continue tube feeds.  Concern for seizure activity: - Evaluated by neurology, underwent EEG.  - Continue with Vimpat , seizure precautions   Vasospasm with left cerebellar infarct:  -Seen by Neurology.  Noted to be on aspirin . -LDL 63.  HbA1c 4.2. - PT evaluation recommended SNF, patient will benefit in the LTAC/select facility given hemodialysis and ongoing significant medical issues.  ESRD on hemodialysis, MWF, hyperkalemia - Management per nephrology - chronically immunosuppressed with CellCept , Cyclosporine , and Prednisone   -Patient has been refusing hemodialysis.  Overnight given IV calcium  gluconate for hyperkalemia   Essential Hypertension: - Midodrine  has been discontinued.  - Unfortunately due to access issues, patient has not been getting consistent antihypertensives except IV hydralazine  as needed.  Clonidine  patch by nephrology.   Chest Pain - Atypical, 2D echo showed EF normal 60 to 65%, with no regional WMA, mild LVH -Continue pain control as needed   Bradycardia:  -Secondary to vagal response. Resolved.   Abdominal Discomfort and Chest Discomfort:  - Continue pain control as needed     Colitis: Stable.     Status post Pancreatic Transplant/Diabetes mellitus  CBG (last 3)  Recent Labs    03/22/24 2317 03/23/24 0345 03/23/24 0901  GLUCAP 106* 110* 101*   Continue sensitive sliding scale insulin    Anemia of Chronic Disease H&H stable  Moderate protein calorie malnutrition, underweight Nutrition Problem: Moderate Malnutrition Etiology: chronic illness (ESRD on HD/DM) Signs/Symptoms: mild fat depletion, mild muscle depletion Interventions: Tube feeding  Estimated body mass index is 17.62 kg/m as calculated from the following:    Height as of this encounter: 5' 7.5 (1.715 m).   Weight as of this encounter: 51.8 kg.  Code Status:  DVT Prophylaxis:  heparin  injection 5,000 Units Start: 03/14/24 0600 SCDs Start: 02/14/24 0725   Level of Care: Level of care: Progressive Family Communication: Updated patient Disposition Plan:      Remains inpatient appropriate: TBD   Procedures:    Consultants:   Neurosurgery Nephrology IR  Antimicrobials:   Anti-infectives (From admission, onward)    Start     Dose/Rate Route Frequency Ordered Stop   03/20/24 0845  ceFAZolin  (ANCEF ) IVPB 2g/100 mL premix        over 30 Minutes Intravenous Continuous PRN 03/20/24 0859 03/20/24 0845   02/27/24 1200  vancomycin  (VANCOREADY) IVPB 500 mg/100 mL  Status:  Discontinued        500 mg 100 mL/hr over 60 Minutes Intravenous Every M-W-F (Hemodialysis) 02/25/24 1129 02/25/24 1436   02/26/24 2200  ceFAZolin  (ANCEF ) IVPB 1 g/50 mL premix        1 g 100 mL/hr over 30 Minutes Intravenous Every  24 hours 02/26/24 1009 03/03/24 2140   02/25/24 1530  vancomycin  (VANCOREADY) IVPB 500 mg/100 mL        500 mg 100 mL/hr over 60 Minutes Intravenous Every M-W-F (Hemodialysis) 02/25/24 1436 02/25/24 1934   02/23/24 1800  meropenem  (MERREM ) 1 g in sodium chloride  0.9 % 100 mL IVPB  Status:  Discontinued        1 g 200 mL/hr over 30 Minutes Intravenous Every 24 hours 02/23/24 1625 02/26/24 1009   02/23/24 1800  vancomycin  (VANCOREADY) IVPB 1250 mg/250 mL        1,250 mg 166.7 mL/hr over 90 Minutes Intravenous  Once 02/23/24 1625 02/23/24 1933   02/17/24 2200  piperacillin -tazobactam (ZOSYN ) IVPB 2.25 g        2.25 g 100 mL/hr over 30 Minutes Intravenous Every 8 hours 02/17/24 1412 02/21/24 2336   02/14/24 1800  vancomycin  (VANCOREADY) IVPB 2000 mg/400 mL        2,000 mg 200 mL/hr over 120 Minutes Intravenous  Once 02/14/24 1709 02/14/24 2048   02/14/24 1622  piperacillin -tazobactam (ZOSYN ) IVPB 3.375 g  Status:  Discontinued        3.375  g 100 mL/hr over 30 Minutes Intravenous Every 6 hours 02/14/24 1622 02/17/24 1412   02/14/24 1545  piperacillin -tazobactam (ZOSYN ) IVPB 3.375 g  Status:  Discontinued        3.375 g 12.5 mL/hr over 240 Minutes Intravenous Every 8 hours 02/14/24 1451 02/14/24 1453   02/14/24 1545  piperacillin -tazobactam (ZOSYN ) IVPB 2.25 g  Status:  Discontinued        2.25 g 100 mL/hr over 30 Minutes Intravenous Every 8 hours 02/14/24 1453 02/14/24 1622   02/14/24 0800  cefTRIAXone  (ROCEPHIN ) 2 g in sodium chloride  0.9 % 100 mL IVPB  Status:  Discontinued        2 g 200 mL/hr over 30 Minutes Intravenous Every 24 hours 02/14/24 0730 02/14/24 1451   02/14/24 0800  metroNIDAZOLE  (FLAGYL ) IVPB 500 mg  Status:  Discontinued        500 mg 100 mL/hr over 60 Minutes Intravenous Every 12 hours 02/14/24 0730 02/14/24 1451          Medications  aspirin   81 mg Per Tube Daily   Chlorhexidine  Gluconate Cloth  6 each Topical Q0600   Chlorhexidine  Gluconate Cloth  6 each Topical Q0600   cloNIDine   0.1 mg Transdermal Weekly   cycloSPORINE   200 mg Per Tube QHS   cycloSPORINE   225 mg Per Tube Daily   darbepoetin (ARANESP ) injection - DIALYSIS  100 mcg Subcutaneous Q Fri-1800   feeding supplement  237 mL Per Tube BID BM   heparin  injection (subcutaneous)  5,000 Units Subcutaneous Q8H   insulin  aspart  0-9 Units Subcutaneous Q4H   lacosamide   100 mg Per Tube BID   metoCLOPramide  (REGLAN ) injection  5 mg Intravenous Q8H   metoprolol  tartrate  25 mg Per Tube BID   multivitamin  1 tablet Per Tube QHS   mycophenolate   500 mg Per Tube BID   sevelamer  carbonate  2.4 g Per Tube TID WC   sodium chloride  flush  3 mL Intravenous Q12H      Subjective:   [name] [name] was seen and examined today.  Continues to complain of pain, refused HD, no fevers.     Objective:   Vitals:   03/23/24 0342 03/23/24 0500 03/23/24 0600 03/23/24 0848  BP:   (!) 177/75 (!) 186/80  Pulse:   89 95  Resp:  13 14  Temp: 98.4 F (36.9  C)   98.8 F (37.1 C)  TempSrc: Oral   Oral  SpO2:   97% 98%  Weight:  51.8 kg    Height:       No intake or output data in the 24 hours ending 03/23/24 1136    Wt Readings from Last 3 Encounters:  03/23/24 51.8 kg  11/07/23 57.3 kg  09/26/23 56.7 kg   Physical Exam General: Alert and oriented x 3, NAD, ill-appearing Cardiovascular: S1 S2 clear, RRR.  Respiratory: CTAB Gastrointestinal: Soft, PEG tube+ Ext: no pedal edema bilaterally, RUE AV fistula Neuro: no new deficits Psych: Normal affect    Data Reviewed:  I have personally reviewed following labs    CBC Lab Results  Component Value Date   WBC 3.9 (L) 03/23/2024   RBC 2.71 (L) 03/23/2024   HGB 8.5 (L) 03/23/2024   HCT 26.0 (L) 03/23/2024   MCV 95.9 03/23/2024   MCH 31.4 03/23/2024   PLT 203 03/23/2024   MCHC 32.7 03/23/2024   RDW 15.1 03/23/2024   LYMPHSABS 1.3 03/19/2024   MONOABS 0.5 03/19/2024   EOSABS 0.1 03/19/2024   BASOSABS 0.0 03/19/2024     Last metabolic panel Lab Results  Component Value Date   NA 130 (L) 03/23/2024   K 4.6 03/23/2024   CL 92 (L) 03/23/2024   CO2 23 03/23/2024   BUN 45 (H) 03/23/2024   CREATININE 7.73 (H) 03/23/2024   GLUCOSE 112 (H) 03/23/2024   GFRNONAA 6 (L) 03/23/2024   GFRAA  02/02/2022    PATIENT IDENTIFICATION ERROR. PLEASE DISREGARD RESULTS. ACCOUNT WILL BE CREDITED.   CALCIUM  9.8 03/23/2024   PHOS 6.0 (H) 03/23/2024   PROT 6.3 (L) 03/19/2024   ALBUMIN 2.4 (L) 03/23/2024   BILITOT 0.5 03/19/2024   ALKPHOS 111 03/19/2024   AST 20 03/19/2024   ALT 11 03/19/2024   ANIONGAP 15 03/23/2024    CBG (last 3)  Recent Labs    03/22/24 2317 03/23/24 0345 03/23/24 0901  GLUCAP 106* 110* 101*      Coagulation Profile: No results for input(s): INR, PROTIME in the last 168 hours.   Radiology Studies: I have personally reviewed the imaging studies  DG Naso/Oro Gtube Thru Duo-Reposition Result Date: 03/23/2024 INDICATION: 43 year old female with ICH,  hydrocephalus, severe gastroparesis, and previous successful post pyloric Dobbhoff placement on 03/21/2024. Team reporting unable to use Dobbhoff and meeting resistance with flushing. Request for evaluation and possible repositioning. EXAM: IR NASO/ORO GTUBE THRU DUO - REPOSITION FLUOROSCOPY TIME:  Radiation Exposure Index (as provided by the fluoroscopic device): 11 mGy Kerma COMPLICATIONS: None immediate PROCEDURE: Initial imaging revealed the Dobbhoff was kinked within the small bowel likely contributing to resistance with use. The Dobhoff tube was subsequently lubricated with viscous lidocaine  inserted into the right nostril. Under intermittent fluoroscopic guidance, approximately 12 cm of the Dobhoff tube was gently retracted through the right nare until the Dobbhoff was no longer kinked. Approximately 15 mL of Omnipaque  300 was flushed through the tube without meeting resistance. Contrast was noted in the duodenum with a small amount of reflux into the stomach. The tube was then flushed with approximately 5 mL of water . The tip remains post pyloric. A spot fluoroscopic image was saved for documentation purposes. The tube was affixed to the patient's nose with a bridle. The patient tolerated the procedure well without immediate postprocedural complication. IMPRESSION: Successful fluoroscopic guided repositioning of Dobhoff tube with tip terminating over the duodenojejunal junction. The  tube is ready for immediate use. This exam was performed by [facility] PA-C, and was supervised and interpreted by Dr. DELENA Balder. Electronically Signed   By: Juliene Balder M.D.   On: 03/23/2024 10:48   DG [place] MATSU Tube Plc W/Fl W/Rad Result Date: 03/21/2024 INDICATION: Patient is in the hospital with complications from hydrocephalus, ICH. Severe gastroparesis. Patient had G tube placed earlier today and patient pulled her prior post pyloric NG tube after. Unsuccessful post pyloric NG tube placement yesterday. Consult for  re-attempt today. MEDICATIONS: 4 ml 2% viscous lidocaine .  35 mL Omnipaque  300 ANESTHESIA/SEDATION: None CONTRAST:  35 mL Omnipaque  300 - administered into the stomach and proximal duodenum. FLUOROSCOPY: Radiation Exposure Index (as provided by the fluoroscopic device): 101.50 mGy Kerma COMPLICATIONS: None immediate. PROCEDURE: Informed written consent was obtained after a thorough discussion of the procedural risks, benefits and alternatives. All questions were addressed. Maximal Sterile Barrier Technique was utilized including caps, mask, sterile gowns, sterile gloves, hand hygiene and skin antiseptic. A timeout was performed prior to the initiation of the procedure. Nasogastric tube remained in the stomach advanced to the level of the pylorus from placement attempt yesterday. Viscous lidocaine  was initially given intranasally for pain relief while manipulating NG tube. Guidewire was introduced through the NG tube and was able to be advanced through the pylorus into the duodenum after multiple attempts. The NG tube was advanced over the guidewire into this space. Contrast was then administered with contrast seen traveling through the proximal small bowel showing successful post-pyloric placement in the lower descending duodenum. Guidewire was removed and NG tube was secured in place. Images saved. IMPRESSION: Successful post-pyloric nasogastric tube placement with tip in the lower descending duodenum. Performed by: Wyatt Pommier, PA-C Supervised by: Selinda Blue, MD Electronically Signed   By: Selinda DELENA Blue M.D.   On: 03/21/2024 12:51       Sandy Blouch M.D. [name] 03/23/2024, 11:36 AM  Available via Epic secure chat 7am-7pm After 7 pm, please refer to night coverage provider listed on amion.

## 2024-03-24 DIAGNOSIS — I609 Nontraumatic subarachnoid hemorrhage, unspecified: Secondary | ICD-10-CM | POA: Diagnosis not present

## 2024-03-24 DIAGNOSIS — R112 Nausea with vomiting, unspecified: Secondary | ICD-10-CM | POA: Diagnosis not present

## 2024-03-24 DIAGNOSIS — N186 End stage renal disease: Secondary | ICD-10-CM | POA: Diagnosis not present

## 2024-03-24 DIAGNOSIS — Z7189 Other specified counseling: Secondary | ICD-10-CM | POA: Diagnosis not present

## 2024-03-24 DIAGNOSIS — K529 Noninfective gastroenteritis and colitis, unspecified: Secondary | ICD-10-CM | POA: Diagnosis not present

## 2024-03-24 DIAGNOSIS — Z515 Encounter for palliative care: Secondary | ICD-10-CM | POA: Diagnosis not present

## 2024-03-24 LAB — CBC
HCT: 27.4 % — ABNORMAL LOW (ref 36.0–46.0)
Hemoglobin: 9.2 g/dL — ABNORMAL LOW (ref 12.0–15.0)
MCH: 31.6 pg (ref 26.0–34.0)
MCHC: 33.6 g/dL (ref 30.0–36.0)
MCV: 94.2 fL (ref 80.0–100.0)
Platelets: 220 K/uL (ref 150–400)
RBC: 2.91 MIL/uL — ABNORMAL LOW (ref 3.87–5.11)
RDW: 14.9 % (ref 11.5–15.5)
WBC: 5.6 K/uL (ref 4.0–10.5)
nRBC: 0 % (ref 0.0–0.2)

## 2024-03-24 LAB — RENAL FUNCTION PANEL
Albumin: 2.6 g/dL — ABNORMAL LOW (ref 3.5–5.0)
Anion gap: 16 — ABNORMAL HIGH (ref 5–15)
BUN: 14 mg/dL (ref 6–20)
CO2: 27 mmol/L (ref 22–32)
Calcium: 9.4 mg/dL (ref 8.9–10.3)
Chloride: 90 mmol/L — ABNORMAL LOW (ref 98–111)
Creatinine, Ser: 3.65 mg/dL — ABNORMAL HIGH (ref 0.44–1.00)
GFR, Estimated: 15 mL/min — ABNORMAL LOW (ref >60)
Glucose, Bld: 117 mg/dL — ABNORMAL HIGH (ref 70–99)
Phosphorus: 3.5 mg/dL (ref 2.5–4.6)
Potassium: 3.3 mmol/L — ABNORMAL LOW (ref 3.5–5.1)
Sodium: 133 mmol/L — ABNORMAL LOW (ref 135–145)

## 2024-03-24 LAB — GLUCOSE, CAPILLARY
Glucose-Capillary: 140 mg/dL — ABNORMAL HIGH (ref 70–99)
Glucose-Capillary: 150 mg/dL — ABNORMAL HIGH (ref 70–99)
Glucose-Capillary: 165 mg/dL — ABNORMAL HIGH (ref 70–99)
Glucose-Capillary: 131 mg/dL — ABNORMAL HIGH (ref 70–99)
Glucose-Capillary: 115 mg/dL — ABNORMAL HIGH (ref 70–99)

## 2024-03-24 LAB — POTASSIUM
Potassium: 3.7 mmol/L (ref 3.5–5.1)
Potassium: 3.9 mmol/L (ref 3.5–5.1)
Potassium: 4 mmol/L (ref 3.5–5.1)
Potassium: 4.7 mmol/L (ref 3.5–5.1)
Potassium: 5.3 mmol/L — ABNORMAL HIGH (ref 3.5–5.1)

## 2024-03-24 MED ORDER — METOPROLOL TARTRATE 50 MG PO TABS
50.0000 mg | ORAL_TABLET | Freq: Two times a day (BID) | ORAL | Status: DC
  Administered 2024-03-24 – 2024-03-30 (×10): 50 mg
  Filled 2024-03-24 (×10): qty 1

## 2024-03-24 MED ORDER — AMLODIPINE BESYLATE 10 MG PO TABS
10.0000 mg | ORAL_TABLET | Freq: Every day | ORAL | Status: DC
  Administered 2024-03-24 – 2024-03-30 (×5): 10 mg
  Filled 2024-03-24 (×5): qty 1

## 2024-03-24 MED ORDER — ONDANSETRON HCL 4 MG/2ML IJ SOLN
INTRAMUSCULAR | Status: AC
  Filled 2024-03-24: qty 2

## 2024-03-24 MED ORDER — PREDNISONE 5 MG PO TABS
15.0000 mg | ORAL_TABLET | Freq: Every day | ORAL | Status: DC
  Administered 2024-03-24 – 2024-03-29 (×6): 15 mg via ORAL
  Filled 2024-03-24 (×6): qty 3

## 2024-03-24 MED ORDER — HYDROMORPHONE HCL 1 MG/ML IJ SOLN
INTRAMUSCULAR | Status: AC
Start: 2024-03-24 — End: 2024-03-24
  Filled 2024-03-24: qty 0.5

## 2024-03-24 NOTE — Plan of Care (Signed)
 Problem: Clinical Measurements: Goal: Ability to maintain clinical measurements within normal limits will improve 03/24/2024 0633 by Marvis Kenneth SAILOR, RN Outcome: Progressing 03/23/2024 2058 by Marvis Kenneth SAILOR, RN Outcome: Progressing Goal: Will remain free from infection 03/24/2024 0633 by Marvis Kenneth SAILOR, RN Outcome: Progressing 03/23/2024 2058 by Marvis Kenneth SAILOR, RN Outcome: Progressing Goal: Diagnostic test results will improve 03/24/2024 0633 by Marvis Kenneth SAILOR, RN Outcome: Progressing 03/23/2024 2058 by Marvis Kenneth SAILOR, RN Outcome: Progressing Goal: Respiratory complications will improve 03/24/2024 0633 by Marvis Kenneth SAILOR, RN Outcome: Progressing 03/23/2024 2058 by Marvis Kenneth SAILOR, RN Outcome: Progressing   Problem: Activity: Goal: Risk for activity intolerance will decrease 03/24/2024 9366 by Marvis Kenneth SAILOR, RN Outcome: Progressing 03/23/2024 2058 by Marvis Kenneth SAILOR, RN Outcome: Progressing   Problem: Nutrition: Goal: Adequate nutrition will be maintained 03/24/2024 9366 by Marvis Kenneth SAILOR, RN Outcome: Progressing 03/23/2024 2058 by Marvis Kenneth SAILOR, RN Outcome: Progressing   Problem: Coping: Goal: Level of anxiety will decrease 03/24/2024 0633 by Marvis Kenneth SAILOR, RN Outcome: Progressing 03/23/2024 2058 by Marvis Kenneth SAILOR, RN Outcome: Progressing   Problem: Elimination: Goal: Will not experience complications related to bowel motility 03/24/2024 0633 by Marvis Kenneth SAILOR, RN Outcome: Progressing 03/23/2024 2058 by Marvis Kenneth SAILOR, RN Outcome: Progressing   Problem: Pain Managment: Goal: General experience of comfort will improve and/or be controlled 03/24/2024 9366 by Marvis Kenneth SAILOR, RN Outcome: Progressing 03/23/2024 2058 by Marvis Kenneth SAILOR, RN Outcome: Progressing   Problem: Safety: Goal: Ability to remain free from injury will improve 03/24/2024 0633 by Marvis Kenneth SAILOR, RN Outcome: Progressing 03/23/2024 2058  by Marvis Kenneth SAILOR, RN Outcome: Progressing   Problem: Skin Integrity: Goal: Risk for impaired skin integrity will decrease 03/24/2024 9366 by Marvis Kenneth SAILOR, RN Outcome: Progressing 03/23/2024 2058 by Marvis Kenneth SAILOR, RN Outcome: Progressing   Problem: Coping: Goal: Ability to adjust to condition or change in health will improve 03/24/2024 0633 by Marvis Kenneth SAILOR, RN Outcome: Progressing 03/23/2024 2058 by Marvis Kenneth SAILOR, RN Outcome: Progressing   Problem: Metabolic: Goal: Ability to maintain appropriate glucose levels will improve 03/24/2024 0633 by Marvis Kenneth SAILOR, RN Outcome: Progressing 03/23/2024 2058 by Marvis Kenneth SAILOR, RN Outcome: Progressing   Problem: Nutritional: Goal: Maintenance of adequate nutrition will improve 03/24/2024 0633 by Marvis Kenneth SAILOR, RN Outcome: Progressing 03/23/2024 2058 by Marvis Kenneth SAILOR, RN Outcome: Progressing Goal: Progress toward achieving an optimal weight will improve 03/24/2024 0633 by Marvis Kenneth SAILOR, RN Outcome: Progressing 03/23/2024 2058 by Marvis Kenneth SAILOR, RN Outcome: Progressing   Problem: Skin Integrity: Goal: Risk for impaired skin integrity will decrease 03/24/2024 0633 by Marvis Kenneth SAILOR, RN Outcome: Progressing 03/23/2024 2058 by Marvis Kenneth SAILOR, RN Outcome: Progressing   Problem: Education: Goal: Knowledge of secondary prevention will improve (MUST DOCUMENT ALL) 03/24/2024 0633 by Marvis Kenneth SAILOR, RN Outcome: Progressing 03/23/2024 2058 by Marvis Kenneth SAILOR, RN Outcome: Progressing Goal: Knowledge of patient specific risk factors will improve (DELETE if not current risk factor) 03/24/2024 0633 by Marvis Kenneth SAILOR, RN Outcome: Progressing 03/23/2024 2058 by Marvis Kenneth SAILOR, RN Outcome: Progressing   Problem: Intracerebral Hemorrhage Tissue Perfusion: Goal: Complications of Intracerebral Hemorrhage will be minimized 03/24/2024 9366 by Marvis Kenneth SAILOR, RN Outcome:  Progressing 03/23/2024 2058 by Marvis Kenneth SAILOR, RN Outcome: Progressing   Problem: Health Behavior/Discharge Planning: Goal: Goals will be collaboratively established with patient/family 03/24/2024 [phone] by Marvis Kenneth SAILOR, RN Outcome: Progressing 03/23/2024 2058 by Marvis Kenneth SAILOR, RN Outcome: Progressing  Problem: Self-Care: Goal: Ability to communicate needs accurately will improve 03/24/2024 0633 by Marvis Kenneth SAILOR, RN Outcome: Progressing 03/23/2024 2058 by Marvis Kenneth SAILOR, RN Outcome: Progressing   Problem: Nutrition: Goal: Risk of aspiration will decrease 03/24/2024 0633 by Marvis Kenneth SAILOR, RN Outcome: Progressing 03/23/2024 2058 by Marvis Kenneth SAILOR, RN Outcome: Progressing Goal: Dietary intake will improve 03/24/2024 0633 by Marvis Kenneth SAILOR, RN Outcome: Progressing 03/23/2024 2058 by Marvis Kenneth SAILOR, RN Outcome: Progressing

## 2024-03-24 NOTE — Progress Notes (Signed)
 Physical Therapy Treatment Patient Details Name: [name] [name] MRN: [id number] DOB: 20-Sep-1980 Today's Date: 03/24/2024   History of Present Illness 43 yo female who presented to the ED 6/11 with n/v, abd pain after HD. 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 CRRT 6/13- 6/15. Cerebral angiogram 6/25 shows no intracranial aneurysms, AVM, or fistula seen to explain the patient's subarachnoid hemorrhage, diffuse mild-moderate anterior circulation vasospasm. EVD 6/12-6/30.  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 LP 7/10- negative. 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    PT Comments  Patient sleeping on arrival. Awakened and pt unable to read the time on the clock. Told her it is 2:15 and she was able to identify that it was pm (not am). Pt with poor initiation for all movement. Requires max cues and facilitation to come to sit at EOB. Once seated, she has tendency to flex forward at hips with head hanging forward. At times self-corrects and at times requires max assist to help her come back to upright/vertical with her torso. Worked on leaning on left forearm and pushing back up to midline with little effort put forth by pt. Able to progress to standing with +2 min assist and ambulated 6 ft to chair with +2 min assist for balance. Patient slowly progressing with standing and gait. Patient will benefit from continued inpatient follow up therapy, <3 hours/day     If plan is discharge home, recommend the following: Assistance with cooking/housework;Assistance with feeding;Direct supervision/assist for medications management;Direct supervision/assist for financial management;Assist for transportation;Help with stairs or ramp for entrance;Supervision due to cognitive status;A lot of help with bathing/dressing/bathroom;Two people to help with  walking and/or transfers   Can travel by private vehicle     No  Equipment Recommendations  Wheelchair cushion (measurements PT);Wheelchair (measurements PT);Hospital bed;Hoyer lift    Recommendations for Other Services       Precautions / Restrictions Precautions Precautions: Fall Recall of Precautions/Restrictions: Impaired Precaution/Restrictions Comments: PEG, flexiseal Restrictions Weight Bearing Restrictions Per Provider Order: No     Mobility  Bed Mobility Overal bed mobility: Needs Assistance Bed Mobility: Rolling, Sidelying to Sit Rolling: Min assist Sidelying to sit: Min assist, Used rails, HOB elevated       General bed mobility comments: once on her side, she moved legs over EOB herself and initiated pushing up with UEs; assist to raise torso    Transfers Overall transfer level: Needs assistance Equipment used: 2 person hand held assist Transfers: Sit to/from Stand Sit to Stand: Min assist, +2 safety/equipment           General transfer comment: initial attempt to stand pt with no effort to assist; on second attempt pt only required min assist    Ambulation/Gait Ambulation/Gait assistance: +2 physical assistance, Min assist Gait Distance (Feet): 6 Feet Assistive device: 2 person hand held assist Gait Pattern/deviations: Trunk flexed, Decreased stride length, Shuffle Gait velocity: decreased     General Gait Details: pt rushing to get to chair with trunk getting anterior to her BOS with assist to correct   Stairs             Wheelchair Mobility     Tilt Bed    Modified Rankin (Stroke Patients Only) Modified Rankin (Stroke Patients Only) Pre-Morbid Rankin Score: No symptoms Modified Rankin: Moderately severe disability     Balance Overall balance assessment: Needs assistance Sitting-balance support:  Feet supported, Bilateral upper extremity supported Sitting balance-Leahy Scale: Poor Sitting balance - Comments: CGA-mod A for  sitting balance; pt varies leaning left, rt, or anterior   Standing balance support: During functional activity, Single extremity supported Standing balance-Leahy Scale: Poor                              Communication Communication Communication: Impaired Factors Affecting Communication: Reduced clarity of speech  Cognition Arousal: Lethargic Behavior During Therapy: Flat affect   PT - Cognitive impairments: Attention, Awareness, Problem solving, Safety/Judgement, Initiation                       PT - Cognition Comments: pt A&Ox3, to self, location, and situation. Time NT Following commands: Impaired Following commands impaired: Only follows one step commands consistently    Cueing Cueing Techniques: Verbal cues, Gestural cues  Exercises Other Exercises Other Exercises: seated balance intervention: finding upright with truncal facilitation    General Comments        Pertinent Vitals/Pain Pain Assessment Pain Assessment: Faces Faces Pain Scale: Hurts little more Pain Location: abd; fixated on asking for Dilaudid  Pain Descriptors / Indicators: Discomfort Pain Intervention(s): Limited activity within patient's tolerance, Monitored during session    Home Living                          Prior Function            PT Goals (current goals can now be found in the care plan section) Acute Rehab PT Goals Patient Stated Goal: to rest PT Goal Formulation: With patient Time For Goal Achievement: 04/03/24 Potential to Achieve Goals: Poor Progress towards PT goals: Progressing toward goals    Frequency    Min 1X/week      PT Plan      Co-evaluation              AM-PAC PT 6 Clicks Mobility   Outcome Measure  Help needed turning from your back to your side while in a flat bed without using bedrails?: A Little Help needed moving from lying on your back to sitting on the side of a flat bed without using bedrails?: A Lot Help  needed moving to and from a bed to a chair (including a wheelchair)?: A Little Help needed standing up from a chair using your arms (e.g., wheelchair or bedside chair)?: A Little Help needed to walk in hospital room?: Total Help needed climbing 3-5 steps with a railing? : Total 6 Click Score: 13    End of Session Equipment Utilized During Treatment: Gait belt Activity Tolerance: Patient limited by pain Patient left: with call bell/phone within reach;in chair;with chair alarm set Nurse Communication: Mobility status PT Visit Diagnosis: Muscle weakness (generalized) (M62.81);Other symptoms and signs involving the nervous system (R29.898) Hemiplegia - Right/Left: Left Hemiplegia - dominant/non-dominant: Non-dominant Hemiplegia - caused by: Other Nontraumatic intracranial hemorrhage     Time: 8591-8567 PT Time Calculation (min) (ACUTE ONLY): 24 min  Charges:    $Gait Training: 8-22 mins $Therapeutic Activity: 8-22 mins PT General Charges $$ ACUTE PT VISIT: 1 Visit                      Macario RAMAN, PT Acute Rehabilitation Services  Office [phone]    Macario SHAUNNA Soja 03/24/2024, 4:19 PM

## 2024-03-24 NOTE — Progress Notes (Signed)
 This chaplain joined PMT NP-Eric for a F/U visit with the Pt.  The chaplain understands from conversation with the Pt., the Pt. remains interested in completing HCPOA at the hospital. With the Pt. Permission, NP-Eric is attempting to contact the Pt. friend-Nakema for an update.  This chaplain is planning a revisit for Tuesday at 2:30pm.  Chaplain Leeroy Hummer [phone]

## 2024-03-24 NOTE — Progress Notes (Signed)
 Speech Language Pathology Treatment: Dysphagia;Cognitive-Linguistic  Patient Details Name: [name] [name] MRN: [id number] DOB: 09/17/1980 Today's Date: 03/24/2024 Time: 8985-8970 SLP Time Calculation (min) (ACUTE ONLY): 15 min  Assessment / Plan / Recommendation Clinical Impression  Pt lethargic s/p HD earlier this date. Upon being repositioned upright, pt expectorated frothy tan secretions/?emesis repeatedly. Attempts at brushing her anterior teeth caused gagging and expectoration, which continued throughout the duration of the session. Discussed with RD, who states pt is eating minimal amounts by mouth but is tolerating post-pyloric TF via Cortrak. Although PO trials were deferred today, pt demonstrates the ability to swallow functionally but is limited by persistent emesis. Suspect pt could advance to solids once this is better managed, although for now recommend continuing full liquid diet due to the concern for post-prandial aspiration and poor nutrition.    She participated in a verbal reasoning task given Max cueing. She was able to identify similarities between two concrete objects given binary choices in 50% of opportunities. Suspect lethargy and frequent emesis limited performance today. SLP will continue following.    HPI HPI: Patient is a 43 y.o. female with PMH: ESRD on HD, s/p renal and pancreatic transplant, DM-1 with gastroparesis, anxiety, narcotic abuse, vision loss, non-compliance with medical treatment. She presented to the hospital on 02/14/24 with nausea and vomiting. She was admitted with possible sepsis secondary to colitis, acute respiratory failure with hypoxia, acute metabolic encephalopathy. 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 She was emergently intubated on 6/12-6/14. CRRT 6/13-6/15, EVD 6/12-6/30. MRI 7/1 shows new small L parietal CVA s/p vagal episodes with n/v. LP 7/10 negative. G-tube placed 7/17.      SLP Plan  Continue with current plan  of care          Recommendations  Diet recommendations: Thin liquid (full liquids) Liquids provided via: Teaspoon;Cup;Straw Medication Administration: Whole meds with liquid Supervision: Staff to assist with self feeding;Full supervision/cueing for compensatory strategies Compensations: Minimize environmental distractions;Slow rate;Small sips/bites Postural Changes and/or Swallow Maneuvers: Seated upright 90 degrees;Upright 30-60 min after meal                  Oral care QID   Frequent or constant Supervision/Assistance Dysphagia, unspecified (R13.10);Cognitive communication deficit (M58.158)     Continue with current plan of care     Damien Blumenthal, M.A., CCC-SLP Speech Language Pathology, Acute Rehabilitation Services  Secure Chat preferred [phone]   03/24/2024, 10:53 AM

## 2024-03-24 NOTE — Progress Notes (Signed)
 Daily Progress Note   Date: 03/24/2024   Patient Name: [name] [name]  DOB: 04-Jan-1981  MRN: [id number]  Age / Sex: 43 y.o., female  Attending Physician: Davia Nydia POUR, MD Primary Care Physician: [facility] Admit Date: 02/13/2024 Length of Stay: 39 days  Reason for Consultation: Establishing goals of care  Past Medical History:  Diagnosis Date   Anemia    Anxiety    DM (diabetes mellitus), type 1 (HCC)    ESRD on hemodialysis (HCC)    Gastroparesis    History of simultaneous kidney and pancreas transplant (HCC) 2014   Hypertension    Ischemic cerebrovascular accident (CVA) (HCC) 11/2021   Narcotic abuse (HCC)    Non compliance with medical treatment    Stroke (HCC)    Vision loss     Subjective:   Subjective: Chart Reviewed. Updates received. Patient Assessed. Created space and opportunity for patient  and family to explore thoughts and feelings regarding current medical situation.  Today's Discussion: Today before meeting with the patient/family, I reviewed the chart including internal medicine note from today, nephrology note from today, SLP note from today.  The patient had dialysis early morning today and plan is to switch to TTS schedule for this week and then back to MWF after that (patient refused dialysis on 7/19).  Thus far patient has desired full code and full scope of care including G-tube/J-tube placement.  Have been attempting to complete advance directives naming friend [name] [name] as HCPOA.  I also reviewed labs today including CBC which shows no leukocytosis, stable hemoglobin at 9.2.  Renal function panel shows persistent but stable hyponatremia compared to yesterday at 133, expected decrease in creatinine given ESRD at 3.65 today, BUN increased to 14; yesterday BUN/creatinine was 45/7.73 which is not surprising given that she refused dialysis on 03/22/2024.  I went see the patient along with chaplain Leeroy Hummer, no family was bedside.  The  patient was sleeping but awakened easily to voice and light touch.  She states she is quite tired today.  She notes that pain and nausea are about the same.  We discussed designating a decision maker given that her [place] of [place] and she again notes that she wants her best friend [name] [name] to be her designated [employer].  We discussed that this needs to be completed via paperwork and she request that we try again tomorrow as she is quite tired today.  I asked her if she is spoken with her friend about what is going on and she states yes she is well aware.  I asked if it was okay to call and discussed designating HCPOA with her friend and she states yes of course.  I asked her a preferred time for us  to come by tomorrow and she states later afternoon and so we scheduled 2:30 PM to allow for availability of notary and witnesses after education.  I provided emotional and general support through therapeutic listening, empathy, sharing of stories, therapeutic touch, and other techniques. I answered all questions and addressed all concerns to the best of my ability.  After seeing the patient I attempted to call her best friend [name] [name] but was unable to speak to her.  I left a HIPAA appropriate voicemail requesting callback to our team.  Review of Systems  Constitutional:  Positive for fatigue.       Pain in general is stable/unchanged  Gastrointestinal:  Positive for nausea (Unchanged). Negative for abdominal pain.  Neurological:  Positive for weakness.    Objective:   Primary Diagnoses: Present on Admission:  (Resolved) Intractable nausea and vomiting  Colitis  Acute respiratory failure with hypoxia (HCC)  Hyperkalemia  Acute metabolic encephalopathy  Hypertensive urgency  Prolonged QT interval  Pancytopenia (HCC)  Controlled type 2 diabetes mellitus with complication, without long-term current use of insulin  (HCC)  Abnormal CT of the abdomen  Sepsis (HCC)   Vital  Signs:  BP (!) 134/56 (BP Location: Left Arm)   Pulse 95   Temp 98.6 F (37 C) (Oral)   Resp 15   Ht 5' 7.5 (1.715 m)   Wt 52 kg   LMP 02/13/2024 (Exact Date)   SpO2 95%   BMI 17.69 kg/m   Physical Exam Vitals and nursing note reviewed.  Constitutional:      General: She is sleeping. She is not in acute distress.    Comments: No objective signs of discomfort or distress  Cardiovascular:     Rate and Rhythm: Normal rate.  Pulmonary:     Effort: Pulmonary effort is normal. No respiratory distress.  Abdominal:     General: Abdomen is flat.  Skin:    General: Skin is warm and dry.     Palliative Assessment/Data: 10% (not tolerating any oral intake)   Existing Vynca/ACP Documentation: None  Assessment & Plan:   HPI/Patient Profile:  43 y.o. 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  She was admitted on 02/13/2024 with possible sepsis secondary to colitis, abdominal pain, nausea, vomiting, history of gastroparesis, acute respiratory failure with hypoxia secondary to volume overload history of pancreatic/renal transplant in 2015, ESRD on HD, acute metabolic encephalopathy, hypertensive urgency, and others.    She has had a very complicated admission as outlined below:   Significant Hospital Events:  6/11 ED w n/v AMS. Abx IVF for colitis 6/12 Admit. AMS decr LOC. Incr O2 req. PCCM consult and code stroke called seen w rapid response nephro NP then neuro  6/13 Intubated with EVD, responding to commands  6/14 Extubated 6/17 acute L cerebellar infarct. Keppra  dc  6/18 Dialysis, fevering, EVD at 10 (129 mL last 24 hrs) 6/19 Narcotic dependence a driving factor. +Vasospasm on TCD. CTA with evolving Left cerebellar infarct, no HT or mass effect, substantially decreased caliber of multiple intracranial arteries and bilateral [place]  branches (especially: Distal left V4, supraclinoid ICAs, MCA and ACA origins), appearance c/w widespread Vasospasm, Vasoconstriction. No associated large vessel occlusion. 6/21 CSF sampline from EVD -- WBC 244 RBC 19000, cx ngtd  6/25: diagnostic angio today with nsgy - negative 6/26 evd leaking when clamped  6/30 evd removed, HD overnight, HTN improved, 7 days cefazolin  complete 7/1 more lethargic> minimal response to narcan  x2, oxy stopped, EEG neg, MRI - new small left parietal cva, cortrak placed postpyloric, vagal episodes with N/V 7/2 iHD, vimpat  started 7/9 she appears agitated.  IVs were removed.  LP to be initiated to evaluate her opening pressure and to ensure that she does not have meningitis if this is still pending to be done. 7/10 LP done at bedside by Neurosurgery. Cortrak was removed yesterday evening and replaced by Fluoro team today.  7/11 Cortrak obstructed and reomved and had to be replaced by Flouro. Calorie Count initiated. Undergoing Dialysis today  7/12: Having chest discomfort. Palliative Care Consulted for GOC Discussion 7/14:  Agreeable to J-tube with venting G-tube. IR consulted. SLP continues to recommend Thin (Full) Liquids 7/16: G-tube placed   Palliative medicine was consulted for GOC conversations.  SUMMARY OF RECOMMENDATIONS   Full code Full scope of care Continue attempts at HCPOA completion, will reattempt tomorrow Attempt to contact quested HCPOA Nakema Bar to inform her of patient's request/decision Palliative medicine will continue to follow and support  Symptom Management:  Per primary team PMT is available to assist as needed  Code Status: Full code  Prognosis: Unable to determine  Discharge Planning: To Be Determined  Discussed with: Medical team, nursing team, chaplain, patient  Thank you for allowing us  to participate in the care of Ember Naseer Ernster PMT will continue to support holistically.  Billing based on MDM: Moderate  Detailed  review of medical records (labs, imaging, vital signs), medically appropriate exam, discussed with treatment team, counseling and education to patient, family, & staff, documenting clinical information, medication management, coordination of care  Camellia Kays, NP Palliative Medicine Team  Team Phone # [phone] (Nights/Weekends)  05/03/2021, 8:17 AM

## 2024-03-24 NOTE — Progress Notes (Signed)
 Received patient in bed to unit.  Alert and oriented.  Informed consent signed and in chart.   TX duration:  Patient tolerated well.  Transported back to the room  Alert, without acute distress.  Hand-off given to patient's nurse.   Access used: fistula Access issues: none  Total UF removed: 1500 ml Medication(s) given: dilaudid  and zofran  Post HD VS: 183/78 Post HD weight: unable to obtain   03/24/24 0528  Vitals  Temp 98 F (36.7 C)  Temp Source Oral  BP (!) 183/78  MAP (mmHg) 107  BP Location Right Arm  BP Method Automatic  Patient Position (if appropriate) Lying  Pulse Rate (!) 44  Pulse Rate Source Monitor  ECG Heart Rate (!) 105  Resp 18  Oxygen  Therapy  O2 Device Room Air  Patient Activity (if Appropriate) In bed  Pulse Oximetry Type Continuous  Post Treatment  Dialyzer Clearance Lightly streaked  Hemodialysis Intake (mL) 0 mL  Liters Processed 72  Fluid Removed (mL) 1500 mL  Tolerated HD Treatment Yes  Post-Hemodialysis Comments HD tx achived as expected, tolerated well, pt is stable.  AVG/AVF Arterial Site Held (minutes) 10 minutes  AVG/AVF Venous Site Held (minutes) 10 minutes  Note  Patient Observations pt is in bed resting.  Fistula / Graft Right Upper arm Arteriovenous fistula  Placement Date/Time: (c) 12/08/20 1403   Placed prior to admission: Yes  Orientation: Right  Access Location: Upper arm  Access Type: Arteriovenous fistula  Site Condition No complications  Fistula / Graft Assessment Present;Thrill;Bruit  Status Deaccessed  Drainage Description None      [name]

## 2024-03-24 NOTE — TOC Progression Note (Signed)
 Transition of Care [facility]) - Progression Note    Patient Details  Name: [name] [name] MRN: [id number] Date of Birth: [birth year]  Transition of Care [facility]) CM/SW Contact  Nola Devere Hands, RN Phone Number: 03/24/2024, 12:30 PM  Clinical Narrative:    Case Manager called referral to [facility], Liaison with [facility]. She will review and update.    Expected Discharge Plan: Skilled Nursing Facility Barriers to Discharge: Continued Medical Work up, English as a second language teacher, SNF Pending bed offer  Expected Discharge Plan and Services                                               Social Determinants of Health (SDOH) Interventions SDOH Screenings   Food Insecurity: Patient Unable To Answer (02/15/2024)  Housing: Low Risk  (03/05/2024)  Transportation Needs: Patient Unable To Answer (02/15/2024)  Utilities: Patient Unable To Answer (02/15/2024)  Social Connections: Unknown (10/04/2022)   Received from [facility]  Tobacco Use: Medium Risk (03/20/2024)    Readmission Risk Interventions    11/17/2021   12:15 PM  Readmission Risk Prevention Plan  Transportation Screening Complete  Medication Review (RN Care Manager) Complete  PCP or Specialist appointment within 3-5 days of discharge Complete  HRI or Home Care Consult Complete  SW Recovery Care/Counseling Consult Complete  Palliative Care Screening Not Applicable  Skilled Nursing Facility Patient Refused

## 2024-03-24 NOTE — Progress Notes (Signed)
 [name], is a 43 y.o. 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 Hypertensive in ED SBP 240s. CT scan suggested colitis. Patient's mentation continued to worsen. CT head was subsequently done which revealed intracranial hemorrhage. Patient was admitted to the ICU.   Significant Hospital Events:  6/11 ED w n/v AMS. Abx IVF for colitis 6/12 Admit. AMS decr LOC. Incr O2 req. PCCM consult and code stroke called seen w rapid response nephro NP then neuro  6/13 Intubated with EVD, responding to commands  6/14 Extubated 6/17 acute L cerebellar infarct. Keppra  dc  6/18 Dialysis, fevering, EVD at 10 (129 mL last 24 hrs) 6/19 Narcotic dependence a driving factor. +Vasospasm on TCD. CTA with evolving Left cerebellar infarct, no HT or mass effect, substantially decreased caliber of multiple intracranial arteries and bilateral [place] branches (especially: Distal left V4, supraclinoid ICAs, MCA and ACA origins), appearance c/w widespread Vasospasm, Vasoconstriction. No associated large vessel occlusion. 6/21 CSF sampline from EVD -- WBC 244 RBC 19000, cx ngtd  6/25: diagnostic angio today with nsgy - negative 6/26 evd leaking when clamped  6/30 evd removed, HD overnight, HTN improved, 7 days cefazolin  complete 7/1 more lethargic> minimal response to narcan  x2, oxy stopped, EEG neg, MRI - new small left parietal cva, cortrak placed postpyloric, vagal episodes with N/V 7/2 iHD, vimpat  started 7/9 she appears agitated.  IVs were  removed.  LP to be initiated to evaluate her opening pressure and to ensure that she does not have meningitis if this is still pending to be done. 7/10 LP done at bedside by Neurosurgery. Cortrak was removed yesterday evening and replaced by Fluoro team today.  7/11 Cortrak obstructed and reomved and had to be replaced by Flouro. Calorie Count initiated. Undergoing Dialysis today  7/12: Having chest discomfort. Palliative Care Consulted for GOC Discussion 7/14: Agreeable to J-tube with venting G-tube. IR consulted. SLP continues to recommend Thin (Full) Liquids 7/16: G-tube placed  Assessment & Plan    Principal Problem:   SAH (subarachnoid hemorrhage) (HCC), ICH with brain compression, communicating hydrocephalus - seen by neurology and neurosurgery s/p right frontal pressure ventriculostomy via burr hole and EVD was removed on 6/30. -MRI brain showed small foci of hemorrhage in the posterior left cerebellum without evidence for acute infarct. Punctate focus of acute infarct involving the medial left occipital cortex, evolving subacute infarct in the left parietal lobe, trace blood products.  No change in management per neurology and neurosurgery. - Due to concern for hydrocephalus, neurosurgery did LP, spinal fluid pressures were low and ruled out meningitis.  Per  Dr. Mavis, she would not benefit from a VP shunt  -Palliative medicine following for GOC.  Oropharyngeal Dysphagia: History of severe gastroparesis with nausea and vomiting - Mentation has improved, placed on full liquid diet however failed calorie count, has nausea vomiting due to gastroparesis.  - Dietitians recommended J-tube w/venting G-tube.  IR was consulted, however G-tube was placed on 7/16.  Dietitian recommended if it can be converted to J tube due to significant gastroparesis. Fluoroscopy guided post pyloric NG tube placed by IR - Continue Reglan  5 mg IV every 8 hours - Patient continues to complain of pain around the  gastrostomy tube, continue pain control - IR has been following, plan is to convert to GJ tube after tract heals in about 4 weeks for jejunal feeds and gastric venting - Resume full liquid diet, Cortrack readjusted by IR.  Continue tube feeds.  Concern for seizure activity: - Evaluated by neurology, underwent EEG.  - Continue with Vimpat , seizure precautions   Vasospasm with left cerebellar infarct:  -Seen by Neurology.  Noted to be on aspirin . -LDL 63.  HbA1c 4.2. - PT evaluation recommended SNF, patient will benefit [facility] given hemodialysis and ongoing significant medical issues.  ESRD on hemodialysis, MWF, hyperkalemia - Management per nephrology - chronically immunosuppressed with CellCept , Cyclosporine , and Prednisone   -Patient has been refusing hemodialysis.  Overnight given IV calcium  gluconate for hyperkalemia   Essential Hypertension: - Midodrine  has been discontinued.  - BP uncontrolled, placed on a clonidine  patch 0.1 mg/weekly by nephrology - Will resume Norvasc  10 mg daily via tube, increase Lopressor  to 50 mg twice daily   Chest Pain - Atypical, 2D echo showed EF normal 60 to 65%, with no regional WMA, mild LVH -Continue pain control as needed   Bradycardia:  -Secondary to vagal response. Resolved.   Abdominal Discomfort and Chest Discomfort:  - Continue pain control as needed     Colitis: Stable.     Status post Pancreatic Transplant/Diabetes mellitus  CBG (last 3)  Recent Labs    03/23/24 1930 03/23/24 2325 03/24/24 0827  GLUCAP 113* 106* 140*   Continue sensitive sliding scale insulin    Anemia of Chronic Disease H&H stable  Moderate protein calorie malnutrition, underweight Nutrition Problem: Moderate Malnutrition Etiology: chronic illness (ESRD on HD/DM) Signs/Symptoms: mild fat depletion, mild muscle depletion Interventions: Tube feeding  Estimated body mass index is 17.69 kg/m as calculated from the following:    Height as of this encounter: 5' 7.5 (1.715 m).   Weight as of this encounter: 52 kg.  Code Status:  DVT Prophylaxis:  heparin  injection 5,000 Units Start: 03/14/24 0600 SCDs Start: 02/14/24 0725   Level of Care: Level of care: Progressive Family Communication: Updated patient Disposition Plan:      Remains inpatient appropriate: TBD, possibly candidate for [facility]?   Procedures:    Consultants:   Neurosurgery Nephrology IR  Antimicrobials:   Anti-infectives (From admission, onward)    Start     Dose/Rate Route Frequency Ordered Stop   03/20/24 0845  ceFAZolin  (ANCEF ) IVPB 2g/100 mL premix        over 30 Minutes Intravenous Continuous PRN 03/20/24 0859 03/20/24 0845   02/27/24 1200  vancomycin  (VANCOREADY) IVPB 500 mg/100 mL  Status:  Discontinued        500 mg 100 mL/hr over 60 Minutes Intravenous Every M-W-F (Hemodialysis) 02/25/24 1129 02/25/24 1436   02/26/24 2200  ceFAZolin  (ANCEF ) IVPB 1 g/50 mL premix  1 g 100 mL/hr over 30 Minutes Intravenous Every 24 hours 02/26/24 1009 03/03/24 2140   02/25/24 1530  vancomycin  (VANCOREADY) IVPB 500 mg/100 mL        500 mg 100 mL/hr over 60 Minutes Intravenous Every M-W-F (Hemodialysis) 02/25/24 1436 02/25/24 1934   02/23/24 1800  meropenem  (MERREM ) 1 g in sodium chloride  0.9 % 100 mL IVPB  Status:  Discontinued        1 g 200 mL/hr over 30 Minutes Intravenous Every 24 hours 02/23/24 1625 02/26/24 1009   02/23/24 1800  vancomycin  (VANCOREADY) IVPB 1250 mg/250 mL        1,250 mg 166.7 mL/hr over 90 Minutes Intravenous  Once 02/23/24 1625 02/23/24 1933   02/17/24 2200  piperacillin -tazobactam (ZOSYN ) IVPB 2.25 g        2.25 g 100 mL/hr over 30 Minutes Intravenous Every 8 hours 02/17/24 1412 02/21/24 2336   02/14/24 1800  vancomycin  (VANCOREADY) IVPB 2000 mg/400 mL        2,000 mg 200 mL/hr over 120 Minutes Intravenous  Once 02/14/24 1709 02/14/24 2048   02/14/24 1622  piperacillin -tazobactam (ZOSYN ) IVPB 3.375 g   Status:  Discontinued        3.375 g 100 mL/hr over 30 Minutes Intravenous Every 6 hours 02/14/24 1622 02/17/24 1412   02/14/24 1545  piperacillin -tazobactam (ZOSYN ) IVPB 3.375 g  Status:  Discontinued        3.375 g 12.5 mL/hr over 240 Minutes Intravenous Every 8 hours 02/14/24 1451 02/14/24 1453   02/14/24 1545  piperacillin -tazobactam (ZOSYN ) IVPB 2.25 g  Status:  Discontinued        2.25 g 100 mL/hr over 30 Minutes Intravenous Every 8 hours 02/14/24 1453 02/14/24 1622   02/14/24 0800  cefTRIAXone  (ROCEPHIN ) 2 g in sodium chloride  0.9 % 100 mL IVPB  Status:  Discontinued        2 g 200 mL/hr over 30 Minutes Intravenous Every 24 hours 02/14/24 0730 02/14/24 1451   02/14/24 0800  metroNIDAZOLE  (FLAGYL ) IVPB 500 mg  Status:  Discontinued        500 mg 100 mL/hr over 60 Minutes Intravenous Every 12 hours 02/14/24 0730 02/14/24 1451          Medications  aspirin   81 mg Per Tube Daily   Chlorhexidine  Gluconate Cloth  6 each Topical Q0600   Chlorhexidine  Gluconate Cloth  6 each Topical Q0600   Chlorhexidine  Gluconate Cloth  6 each Topical Q0600   cloNIDine   0.1 mg Transdermal Weekly   cycloSPORINE   200 mg Per Tube QHS   cycloSPORINE   225 mg Per Tube Daily   darbepoetin (ARANESP ) injection - DIALYSIS  100 mcg Subcutaneous Q Fri-1800   feeding supplement  237 mL Per Tube BID BM   heparin  injection (subcutaneous)  5,000 Units Subcutaneous Q8H   insulin  aspart  0-9 Units Subcutaneous Q4H   lacosamide   100 mg Per Tube BID   metoCLOPramide  (REGLAN ) injection  5 mg Intravenous Q8H   metoprolol  tartrate  25 mg Per Tube BID   multivitamin  1 tablet Per Tube QHS   mycophenolate   500 mg Per Tube BID   predniSONE   15 mg Oral Q breakfast   sevelamer  carbonate  2.4 g Per Tube TID WC   sodium chloride  flush  3 mL Intravenous Q12H      Subjective:   [name] [name] was seen and examined today.  Completed HD this morning.  BP uncontrolled.  No acute nausea or vomiting, chest pain,  headache.      Objective:   Vitals:   03/24/24 0900 03/24/24 0909 03/24/24 1000 03/24/24 1115  BP: (!) 164/71  (!) 145/60 (!) 198/75  Pulse: 100 97 92   Resp: 11 13 15    Temp:      TempSrc:      SpO2: 95% 91% 95%   Weight:      Height:        Intake/Output Summary (Last 24 hours) at 03/24/2024 1132 Last data filed at 03/24/2024 0858 Gross per 24 hour  Intake 3 ml  Output 1800 ml  Net -1797 ml      Wt Readings from Last 3 Encounters:  03/24/24 52 kg  11/07/23 57.3 kg  09/26/23 56.7 kg   Physical Exam General: Alert and oriented x 3, uncomfortable, ill-appearing, core track + Cardiovascular: S1 S2 clear, RRR.  Respiratory: CTAB, no wheezing Gastrointestinal: Soft, NBS, PEG tube + Ext: no pedal edema bilaterally Neuro: no new deficits Psych: anxious  Data Reviewed:  I have personally reviewed following labs    CBC Lab Results  Component Value Date   WBC 5.6 03/24/2024   RBC 2.91 (L) 03/24/2024   HGB 9.2 (L) 03/24/2024   HCT 27.4 (L) 03/24/2024   MCV 94.2 03/24/2024   MCH 31.6 03/24/2024   PLT 220 03/24/2024   MCHC 33.6 03/24/2024   RDW 14.9 03/24/2024   LYMPHSABS 1.3 03/19/2024   MONOABS 0.5 03/19/2024   EOSABS 0.1 03/19/2024   BASOSABS 0.0 03/19/2024     Last metabolic panel Lab Results  Component Value Date   NA 133 (L) 03/24/2024   K 3.3 (L) 03/24/2024   CL 90 (L) 03/24/2024   CO2 27 03/24/2024   BUN 14 03/24/2024   CREATININE 3.65 (H) 03/24/2024   GLUCOSE 117 (H) 03/24/2024   GFRNONAA 15 (L) 03/24/2024   GFRAA  02/02/2022    PATIENT IDENTIFICATION ERROR. PLEASE DISREGARD RESULTS. ACCOUNT WILL BE CREDITED.   CALCIUM  9.4 03/24/2024   PHOS 3.5 03/24/2024   PROT 6.3 (L) 03/19/2024   ALBUMIN 2.6 (L) 03/24/2024   BILITOT 0.5 03/19/2024   ALKPHOS 111 03/19/2024   AST 20 03/19/2024   ALT 11 03/19/2024   ANIONGAP 16 (H) 03/24/2024    CBG (last 3)  Recent Labs    03/23/24 1930 03/23/24 2325 03/24/24 0827  GLUCAP 113* 106* 140*      Coagulation  Profile: No results for input(s): INR, PROTIME in the last 168 hours.   Radiology Studies: I have personally reviewed the imaging studies  DG Naso/Oro Gtube Thru Duo-Reposition Result Date: 03/23/2024 INDICATION: 43 year old female with ICH, hydrocephalus, severe gastroparesis, and previous successful post pyloric Dobbhoff placement on 03/21/2024. Team reporting unable to use Dobbhoff and meeting resistance with flushing. Request for evaluation and possible repositioning. EXAM: IR NASO/ORO GTUBE THRU DUO - REPOSITION FLUOROSCOPY TIME:  Radiation Exposure Index (as provided by the fluoroscopic device): 11 mGy Kerma COMPLICATIONS: None immediate PROCEDURE: Initial imaging revealed the Dobbhoff was kinked within the small bowel likely contributing to resistance with use. The Dobhoff tube was subsequently lubricated with viscous lidocaine  inserted into the right nostril. Under intermittent fluoroscopic guidance, approximately 12 cm of the Dobhoff tube was gently retracted through the right nare until the Dobbhoff was no longer kinked. Approximately 15 mL of Omnipaque  300 was flushed through the tube without meeting resistance. Contrast was noted in the duodenum with a small amount of reflux into the stomach. The tube was then flushed with approximately 5 mL of  water . The tip remains post pyloric. A spot fluoroscopic image was saved for documentation purposes. The tube was affixed to the patient's nose with a bridle. The patient tolerated the procedure well without immediate postprocedural complication. IMPRESSION: Successful fluoroscopic guided repositioning of Dobhoff tube with tip terminating over the duodenojejunal junction. The tube is ready for immediate use. This exam was performed by [facility] PA-C, and was supervised and interpreted by Dr. DELENA Balder. Electronically Signed   By: Juliene Balder M.D.   On: 03/23/2024 10:48       Angell Honse M.D. [name] 03/24/2024, 11:32 AM  Available  via Epic secure chat 7am-7pm After 7 pm, please refer to night coverage provider listed on amion.

## 2024-03-24 NOTE — Progress Notes (Addendum)
 [facility] Progress Note   Subjective:    Seen and examined patient at bedside. Opens eyes to voice but with minimal response. Noted she refused HD on 7/19 but received HD early this morning. Noted 1.5L removed. Next HD 7/23 per her routine schedule.   Objective Vitals:   03/24/24 1000 03/24/24 1115 03/24/24 1200 03/24/24 1249  BP: (!) 145/60 (!) 198/75 (!) 167/72 (!) 134/56  Pulse: 92 93 92 95  Resp: 15  13 15   Temp:      TempSrc:      SpO2: 95%  95% 95%  Weight:      Height:       Physical Exam General adult female in bed in NAD; opens eyes to voice with minimal response Neck supple trachea midline Lungs clear to auscultation bilaterally normal work of breathing at rest; on room air Heart S1S2 tachycardic no rub Abdomen soft nontender nondistended Extremities no edema  Access RUE AVF with bruit and thrill; aneurysmal region  [facility] Weights   03/22/24 0500 03/23/24 0500 03/24/24 0113  Weight: 51.6 kg 51.8 kg 52 kg    Intake/Output Summary (Last 24 hours) at 03/24/2024 1315 Last data filed at 03/24/2024 0858 Gross per 24 hour  Intake 3 ml  Output 1800 ml  Net -1797 ml    Additional Objective Labs: Basic Metabolic Panel: Recent Labs  Lab 03/22/24 0421 03/22/24 2013 03/23/24 0026 03/23/24 0533 03/23/24 1017 03/23/24 2057 03/24/24 0003 03/24/24 0708  NA 134* 132*  --  130*  --   --   --  133*  K 4.7 5.3*   < > 4.6   < > 5.0 5.3* 3.3*  CL 95* 92*  --  92*  --   --   --  90*  CO2 24 22  --  23  --   --   --  27  GLUCOSE 84 121*  --  112*  --   --   --  117*  BUN 37* 44*  --  45*  --   --   --  14  CREATININE 6.73* 7.67*  --  7.73*  --   --   --  3.65*  CALCIUM  9.8 9.9  --  9.8  --   --   --  9.4  PHOS 5.3*  --   --  6.0*  --   --   --  3.5   < > = values in this interval not displayed.   Liver Function Tests: Recent Labs  Lab 03/19/24 0448 03/20/24 0540 03/22/24 0421 03/23/24 0533 03/24/24 0708  AST 20  --   --   --   --   ALT 11  --    --   --   --   ALKPHOS 111  --   --   --   --   BILITOT 0.5  --   --   --   --   PROT 6.3*  --   --   --   --   ALBUMIN 2.8*   < > 2.5* 2.4* 2.6*   < > = values in this interval not displayed.   No results for input(s): LIPASE, AMYLASE in the last 168 hours. CBC: Recent Labs  Lab 03/19/24 0448 03/20/24 0540 03/21/24 0412 03/22/24 0421 03/23/24 0540 03/24/24 0708  WBC 5.4 6.3 5.1 4.9 3.9* 5.6  NEUTROABS 3.4  --   --   --   --   --   HGB 9.0*  8.8* 9.0* 8.9* 8.5* 9.2*  HCT 27.5* 27.2* 27.8* 27.3* 26.0* 27.4*  MCV 95.8 97.8 96.9 97.8 95.9 94.2  PLT 247 210 207 220 203 220   Blood Culture    Component Value Date/Time   SDES BACK 03/13/2024 1040   SPECREQUEST NONE 03/13/2024 1040   CULT  03/13/2024 1040    NO GROWTH 3 DAYS Performed at [facility], [address], [place], [name] [address]    REPTSTATUS 03/16/2024 FINAL 03/13/2024 1040    Cardiac Enzymes: No results for input(s): CKTOTAL, CKMB, CKMBINDEX, TROPONINI in the last 168 hours. CBG: Recent Labs  Lab 03/23/24 1630 03/23/24 1930 03/23/24 2325 03/24/24 0827 03/24/24 1224  GLUCAP 140* 113* 106* 140* 150*   Iron  Studies: No results for input(s): IRON , TIBC, TRANSFERRIN, FERRITIN in the last 72 hours. Lab Results  Component Value Date   INR 1.3 (H) 02/20/2024   INR 1.3 (H) 02/14/2024   INR 1.2 06/13/2023   Studies/Results: DG Naso/Oro Gtube Thru Duo-Reposition Result Date: 03/23/2024 INDICATION: 43 year old female with ICH, hydrocephalus, severe gastroparesis, and previous successful post pyloric Dobbhoff placement on 03/21/2024. Team reporting unable to use Dobbhoff and meeting resistance with flushing. Request for evaluation and possible repositioning. EXAM: IR NASO/ORO GTUBE THRU DUO - REPOSITION FLUOROSCOPY TIME:  Radiation Exposure Index (as provided by the fluoroscopic device): 11 mGy Kerma COMPLICATIONS: None immediate PROCEDURE: Initial imaging revealed the Dobbhoff was  kinked within the small bowel likely contributing to resistance with use. The Dobhoff tube was subsequently lubricated with viscous lidocaine  inserted into the right nostril. Under intermittent fluoroscopic guidance, approximately 12 cm of the Dobhoff tube was gently retracted through the right nare until the Dobbhoff was no longer kinked. Approximately 15 mL of Omnipaque  300 was flushed through the tube without meeting resistance. Contrast was noted in the duodenum with a small amount of reflux into the stomach. The tube was then flushed with approximately 5 mL of water . The tip remains post pyloric. A spot fluoroscopic image was saved for documentation purposes. The tube was affixed to the patient's nose with a bridle. The patient tolerated the procedure well without immediate postprocedural complication. IMPRESSION: Successful fluoroscopic guided repositioning of Dobhoff tube with tip terminating over the duodenojejunal junction. The tube is ready for immediate use. This exam was performed by [facility] PA-C, and was supervised and interpreted by Dr. DELENA Balder. Electronically Signed   By: Juliene Balder M.D.   On: 03/23/2024 10:48    Medications:  feeding supplement (NEPRO CARB STEADY) 30 mL/hr at 03/23/24 8151    amLODipine   10 mg Per Tube Daily   aspirin   81 mg Per Tube Daily   Chlorhexidine  Gluconate Cloth  6 each Topical Q0600   Chlorhexidine  Gluconate Cloth  6 each Topical Q0600   Chlorhexidine  Gluconate Cloth  6 each Topical Q0600   cloNIDine   0.1 mg Transdermal Weekly   cycloSPORINE   200 mg Per Tube QHS   cycloSPORINE   225 mg Per Tube Daily   darbepoetin (ARANESP ) injection - DIALYSIS  100 mcg Subcutaneous Q Fri-1800   feeding supplement  237 mL Per Tube BID BM   heparin  injection (subcutaneous)  5,000 Units Subcutaneous Q8H   insulin  aspart  0-9 Units Subcutaneous Q4H   lacosamide   100 mg Per Tube BID   metoCLOPramide  (REGLAN ) injection  5 mg Intravenous Q8H   metoprolol  tartrate  50 mg  Per Tube BID   multivitamin  1 tablet Per Tube QHS   mycophenolate   500 mg  Per Tube BID   predniSONE   15 mg Oral Q breakfast   sevelamer  carbonate  2.4 g Per Tube TID WC   sodium chloride  flush  3 mL Intravenous Q12H    Dialysis Orders: MWF AF 3h  B400   57.2kg  2K   AVF  Heparin  none - Hectoral 4mcg IV q HD - Mircera 150mcg IV q 2 weeks (last given 6/9 -> due 6/23)   Assessment/Plan: Intracerebral hemorrhage: C/b hydrocephalus, 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 TCDs with evidence of vasospasm 02/11/24. Followed closely by neurosurgery, evd removed 6/30 CVA: New CVA found on 7/1 ESRD:  HD per MWF schedule usually. Refused HD on 7/19 but received treatment early this morning (1.5L removed). Continue MWF per her routine schedule. S/p CRRT 6/12-6/15, then back on iHD.     Hyperkalemia: resolved w/ TF changed to Nepro Volume: no vol excess on exam. Note she is below her EDW.  HTN:  optimize volume with HD.  Started back amlodipine  at 5 mg daily on 7/15 and increased to 10 mg nightly on 7/17 (evening dosing preferred due to variable HD schedule).  Note that her amlodipine  was not given on 7/17 or 7/18 as no enteric access. Anemia of ESRD: increased aranesp  to 100 mcg every Friday. Iron  adequate Secondary HPTH: Ca and Phos high, VDRA stopped 6/21, added sevelamer  via tube. 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 CyA level 7/09 was 109 (goal 100-200) which is in range.   Team is getting enteric access again - transplant meds would be essential.  She was temporarily converted to IV meds for immunosuppression   Dispo - per primary team.  Team states that plan likely for LTAC once more permanent enteric access obtained   [name], NP [facility] 03/24/2024,1:15 PM  LOS: 39 days

## 2024-03-25 DIAGNOSIS — Z7189 Other specified counseling: Secondary | ICD-10-CM | POA: Diagnosis not present

## 2024-03-25 DIAGNOSIS — Z515 Encounter for palliative care: Secondary | ICD-10-CM | POA: Diagnosis not present

## 2024-03-25 DIAGNOSIS — R112 Nausea with vomiting, unspecified: Secondary | ICD-10-CM | POA: Diagnosis not present

## 2024-03-25 DIAGNOSIS — N186 End stage renal disease: Secondary | ICD-10-CM | POA: Diagnosis not present

## 2024-03-25 DIAGNOSIS — K529 Noninfective gastroenteritis and colitis, unspecified: Secondary | ICD-10-CM | POA: Diagnosis not present

## 2024-03-25 DIAGNOSIS — I609 Nontraumatic subarachnoid hemorrhage, unspecified: Secondary | ICD-10-CM | POA: Diagnosis not present

## 2024-03-25 LAB — RENAL FUNCTION PANEL
Albumin: 2.4 g/dL — ABNORMAL LOW (ref 3.5–5.0)
Anion gap: 11 (ref 5–15)
BUN: 28 mg/dL — ABNORMAL HIGH (ref 6–20)
CO2: 30 mmol/L (ref 22–32)
Calcium: 10 mg/dL (ref 8.9–10.3)
Chloride: 88 mmol/L — ABNORMAL LOW (ref 98–111)
Creatinine, Ser: 5.23 mg/dL — ABNORMAL HIGH (ref 0.44–1.00)
GFR, Estimated: 10 mL/min — ABNORMAL LOW (ref >60)
Glucose, Bld: 130 mg/dL — ABNORMAL HIGH (ref 70–99)
Phosphorus: 4.3 mg/dL (ref 2.5–4.6)
Potassium: 3.5 mmol/L (ref 3.5–5.1)
Sodium: 129 mmol/L — ABNORMAL LOW (ref 135–145)

## 2024-03-25 LAB — GLUCOSE, CAPILLARY
Glucose-Capillary: 133 mg/dL — ABNORMAL HIGH (ref 70–99)
Glucose-Capillary: 131 mg/dL — ABNORMAL HIGH (ref 70–99)
Glucose-Capillary: 148 mg/dL — ABNORMAL HIGH (ref 70–99)
Glucose-Capillary: 131 mg/dL — ABNORMAL HIGH (ref 70–99)
Glucose-Capillary: 111 mg/dL — ABNORMAL HIGH (ref 70–99)

## 2024-03-25 LAB — POTASSIUM
Potassium: 3.7 mmol/L (ref 3.5–5.1)
Potassium: 3.7 mmol/L (ref 3.5–5.1)
Potassium: 4 mmol/L (ref 3.5–5.1)
Potassium: 4.2 mmol/L (ref 3.5–5.1)
Potassium: 4.3 mmol/L (ref 3.5–5.1)

## 2024-03-25 MED ORDER — CHLORHEXIDINE GLUCONATE CLOTH 2 % EX PADS
6.0000 | MEDICATED_PAD | Freq: Every day | CUTANEOUS | Status: DC
  Administered 2024-03-26 – 2024-03-27 (×2): 6 via TOPICAL

## 2024-03-25 NOTE — Progress Notes (Signed)
 [name], is a 43 y.o. 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 Hypertensive in ED SBP 240s. CT scan suggested colitis. Patient's mentation continued to worsen. CT head was subsequently done which revealed intracranial hemorrhage. Patient was admitted to the ICU.   Significant Hospital Events:  6/11 ED w n/v AMS. Abx IVF for colitis 6/12 Admit. AMS decr LOC. Incr O2 req. PCCM consult and code stroke called seen w rapid response nephro NP then neuro  6/13 Intubated with EVD, responding to commands  6/14 Extubated 6/17 acute L cerebellar infarct. Keppra  dc  6/18 Dialysis, fevering, EVD at 10 (129 mL last 24 hrs) 6/19 Narcotic dependence a driving factor. +Vasospasm on TCD. CTA with evolving Left cerebellar infarct, no HT or mass effect, substantially decreased caliber of multiple intracranial arteries and bilateral [place] branches (especially: Distal left V4, supraclinoid ICAs, MCA and ACA origins), appearance c/w widespread Vasospasm, Vasoconstriction. No associated large vessel occlusion. 6/21 CSF sampline from EVD -- WBC 244 RBC 19000, cx ngtd  6/25: diagnostic angio today with nsgy - negative 6/26 evd leaking when clamped  6/30 evd removed, HD overnight, HTN improved, 7 days cefazolin  complete 7/1 more lethargic> minimal response to narcan  x2, oxy stopped, EEG neg, MRI - new small left parietal cva, cortrak placed postpyloric, vagal episodes with N/V 7/2 iHD, vimpat  started 7/9 she appears agitated.  IVs were  removed.  LP to be initiated to evaluate her opening pressure and to ensure that she does not have meningitis if this is still pending to be done. 7/10 LP done at bedside by Neurosurgery. Cortrak was removed yesterday evening and replaced by Fluoro team today.  7/11 Cortrak obstructed and reomved and had to be replaced by Flouro. Calorie Count initiated. Undergoing Dialysis today  7/12: Having chest discomfort. Palliative Care Consulted for GOC Discussion 7/14: Agreeable to J-tube with venting G-tube. IR consulted. SLP continues to recommend Thin (Full) Liquids 7/16: G-tube placed  Assessment & Plan    Principal Problem:   SAH (subarachnoid hemorrhage) (HCC), ICH with brain compression, communicating hydrocephalus - seen by neurology and neurosurgery s/p right frontal pressure ventriculostomy via burr hole and EVD was removed on 6/30. -MRI brain showed small foci of hemorrhage in the posterior left cerebellum without evidence for acute infarct. Punctate focus of acute infarct involving the medial left occipital cortex, evolving subacute infarct in the left parietal lobe, trace blood products.  No change in management per neurology and neurosurgery. - Due to concern for hydrocephalus, neurosurgery did LP, spinal fluid pressures were low and ruled out meningitis.  Per  Dr. Mavis, she would not benefit from a VP shunt  -Palliative medicine following for GOC.  Oropharyngeal Dysphagia: History of severe gastroparesis with nausea and vomiting - Mentation has improved, placed on full liquid diet however failed calorie count, has nausea vomiting due to gastroparesis.  - Dietitians recommended J-tube w/venting G-tube.  IR was consulted, however G-tube was placed on 7/16.  Dietitian recommended if it can be converted to J tube due to significant gastroparesis. Fluoroscopy guided post pyloric NG tube placed by IR - Continue Reglan  5 mg IV every 8 hours - Patient continues to complain of pain around the  gastrostomy tube, continue pain control - IR has been following, plan is to convert to GJ tube after tract heals in about 4 weeks for jejunal feeds and gastric venting - Resume full liquid diet, Cortrack readjusted by IR.  Continue tube feeds.  Concern for seizure activity: - Evaluated by neurology, underwent EEG.  - Continue with Vimpat , seizure precautions   Vasospasm with left cerebellar infarct:  -Seen by Neurology.  Noted to be on aspirin . -LDL 63.  HbA1c 4.2. - PT evaluation recommended SNF, patient will benefit in the LTAC/select facility given hemodialysis and ongoing significant medical issues.  ESRD on hemodialysis, MWF, hyperkalemia - Management per nephrology - chronically immunosuppressed with CellCept , Cyclosporine , and Prednisone     Essential Hypertension: - Midodrine  has been discontinued.  - Continue clonidine  patch, Norvasc  10 mg daily, Lopressor  50 mg twice daily    Chest Pain - Atypical, 2D echo showed EF normal 60 to 65%, with no regional WMA, mild LVH -Continue pain control as needed   Bradycardia:  -Secondary to vagal response. Resolved.   Abdominal Discomfort and Chest Discomfort:  - Continue pain control as needed     Colitis: Stable.     Status post Pancreatic Transplant/Diabetes mellitus  CBG (last 3)  Recent Labs    03/25/24 0311 03/25/24 0757 03/25/24 1150  GLUCAP 133* 131* 148*   Continue sensitive sliding scale insulin    Anemia of Chronic Disease H&H stable  Moderate protein calorie malnutrition, underweight Nutrition Problem: Moderate Malnutrition Etiology: chronic illness (ESRD on HD/DM) Signs/Symptoms: mild fat depletion, mild muscle depletion Interventions: Tube feeding  Estimated body mass index is 17.69 kg/m as calculated from the following:   Height as of this encounter: 5' 7.5 (1.715 m).   Weight as of this encounter: 52 kg.  Code Status:  DVT Prophylaxis:  heparin  injection 5,000 Units Start: 03/14/24 0600 SCDs  Start: 02/14/24 0725   Level of Care: Level of care: Progressive Family Communication: Updated patient Disposition Plan:      Remains inpatient appropriate: TBD, possibly candidate for select/LTAC?   Procedures:    Consultants:   Neurosurgery Nephrology IR  Antimicrobials:   Anti-infectives (From admission, onward)    Start     Dose/Rate Route Frequency Ordered Stop   03/20/24 0845  ceFAZolin  (ANCEF ) IVPB 2g/100 mL premix        over 30 Minutes Intravenous Continuous PRN 03/20/24 0859 03/20/24 0845   02/27/24 1200  vancomycin  (VANCOREADY) IVPB 500 mg/100 mL  Status:  Discontinued        500 mg 100 mL/hr over 60 Minutes Intravenous Every M-W-F (Hemodialysis) 02/25/24 1129 02/25/24 1436   02/26/24 2200  ceFAZolin  (ANCEF ) IVPB 1 g/50 mL premix        1 g 100 mL/hr over 30 Minutes Intravenous Every 24 hours 02/26/24 1009 03/03/24 2140   02/25/24 1530  vancomycin  (VANCOREADY) IVPB 500 mg/100 mL  500 mg 100 mL/hr over 60 Minutes Intravenous Every M-W-F (Hemodialysis) 02/25/24 1436 02/25/24 1934   02/23/24 1800  meropenem  (MERREM ) 1 g in sodium chloride  0.9 % 100 mL IVPB  Status:  Discontinued        1 g 200 mL/hr over 30 Minutes Intravenous Every 24 hours 02/23/24 1625 02/26/24 1009   02/23/24 1800  vancomycin  (VANCOREADY) IVPB 1250 mg/250 mL        1,250 mg 166.7 mL/hr over 90 Minutes Intravenous  Once 02/23/24 1625 02/23/24 1933   02/17/24 2200  piperacillin -tazobactam (ZOSYN ) IVPB 2.25 g        2.25 g 100 mL/hr over 30 Minutes Intravenous Every 8 hours 02/17/24 1412 02/21/24 2336   02/14/24 1800  vancomycin  (VANCOREADY) IVPB 2000 mg/400 mL        2,000 mg 200 mL/hr over 120 Minutes Intravenous  Once 02/14/24 1709 02/14/24 2048   02/14/24 1622  piperacillin -tazobactam (ZOSYN ) IVPB 3.375 g  Status:  Discontinued        3.375 g 100 mL/hr over 30 Minutes Intravenous Every 6 hours 02/14/24 1622 02/17/24 1412   02/14/24 1545  piperacillin -tazobactam (ZOSYN ) IVPB 3.375 g   Status:  Discontinued        3.375 g 12.5 mL/hr over 240 Minutes Intravenous Every 8 hours 02/14/24 1451 02/14/24 1453   02/14/24 1545  piperacillin -tazobactam (ZOSYN ) IVPB 2.25 g  Status:  Discontinued        2.25 g 100 mL/hr over 30 Minutes Intravenous Every 8 hours 02/14/24 1453 02/14/24 1622   02/14/24 0800  cefTRIAXone  (ROCEPHIN ) 2 g in sodium chloride  0.9 % 100 mL IVPB  Status:  Discontinued        2 g 200 mL/hr over 30 Minutes Intravenous Every 24 hours 02/14/24 0730 02/14/24 1451   02/14/24 0800  metroNIDAZOLE  (FLAGYL ) IVPB 500 mg  Status:  Discontinued        500 mg 100 mL/hr over 60 Minutes Intravenous Every 12 hours 02/14/24 0730 02/14/24 1451          Medications  amLODipine   10 mg Per Tube Daily   aspirin   81 mg Per Tube Daily   Chlorhexidine  Gluconate Cloth  6 each Topical Q0600   Chlorhexidine  Gluconate Cloth  6 each Topical Q0600   Chlorhexidine  Gluconate Cloth  6 each Topical Q0600   cloNIDine   0.1 mg Transdermal Weekly   cycloSPORINE   200 mg Per Tube QHS   cycloSPORINE   225 mg Per Tube Daily   darbepoetin (ARANESP ) injection - DIALYSIS  100 mcg Subcutaneous Q Fri-1800   feeding supplement  237 mL Per Tube BID BM   heparin  injection (subcutaneous)  5,000 Units Subcutaneous Q8H   insulin  aspart  0-9 Units Subcutaneous Q4H   lacosamide   100 mg Per Tube BID   metoCLOPramide  (REGLAN ) injection  5 mg Intravenous Q8H   metoprolol  tartrate  50 mg Per Tube BID   multivitamin  1 tablet Per Tube QHS   mycophenolate   500 mg Per Tube BID   predniSONE   15 mg Oral Q breakfast   sevelamer  carbonate  2.4 g Per Tube TID WC   sodium chloride  flush  3 mL Intravenous Q12H      Subjective:   [name] [name] was seen and examined today.  No acute issues.  BP still.  Nausea otherwise no acute vomiting headache, chest pain or breath.    Objective:   Vitals:   03/25/24 1000 03/25/24 1055 03/25/24 1112 03/25/24 1151  BP:  (!) 178/80  136/62  Pulse: 87 93 91 94  Resp: 16 15   17   Temp:  98.8 F (37.1 C)  98.4 F (36.9 C)  TempSrc:  Oral  Axillary  SpO2: 95% 94%  96%  Weight:      Height:        Intake/Output Summary (Last 24 hours) at 03/25/2024 1354 Last data filed at 03/25/2024 1200 Gross per 24 hour  Intake 506 ml  Output 350 ml  Net 156 ml      Wt Readings from Last 3 Encounters:  03/25/24 52 kg  11/07/23 57.3 kg  09/26/23 56.7 kg   Physical Exam General: Alert and oriented x 3, NAD ill-appearing, core track+, deconditioned Cardiovascular: S1 S2 clear, RRR.  Respiratory: CTAB Gastrointestinal: Soft, nontender, nondistended, NBS, PEG tube Ext: no pedal edema bilaterally Neuro: no new deficits Psych: flat affect    Data Reviewed:  I have personally reviewed following labs    CBC Lab Results  Component Value Date   WBC 5.6 03/24/2024   RBC 2.91 (L) 03/24/2024   HGB 9.2 (L) 03/24/2024   HCT 27.4 (L) 03/24/2024   MCV 94.2 03/24/2024   MCH 31.6 03/24/2024   PLT 220 03/24/2024   MCHC 33.6 03/24/2024   RDW 14.9 03/24/2024   LYMPHSABS 1.3 03/19/2024   MONOABS 0.5 03/19/2024   EOSABS 0.1 03/19/2024   BASOSABS 0.0 03/19/2024     Last metabolic panel Lab Results  Component Value Date   NA 129 (L) 03/25/2024   K 4.0 03/25/2024   CL 88 (L) 03/25/2024   CO2 30 03/25/2024   BUN 28 (H) 03/25/2024   CREATININE 5.23 (H) 03/25/2024   GLUCOSE 130 (H) 03/25/2024   GFRNONAA 10 (L) 03/25/2024   GFRAA  02/02/2022    PATIENT IDENTIFICATION ERROR. PLEASE DISREGARD RESULTS. ACCOUNT WILL BE CREDITED.   CALCIUM  10.0 03/25/2024   PHOS 4.3 03/25/2024   PROT 6.3 (L) 03/19/2024   ALBUMIN 2.4 (L) 03/25/2024   BILITOT 0.5 03/19/2024   ALKPHOS 111 03/19/2024   AST 20 03/19/2024   ALT 11 03/19/2024   ANIONGAP 11 03/25/2024    CBG (last 3)  Recent Labs    03/25/24 0311 03/25/24 0757 03/25/24 1150  GLUCAP 133* 131* 148*      Coagulation Profile: No results for input(s): INR, PROTIME in the last 168 hours.   Radiology Studies: I  have personally reviewed the imaging studies  No results found.      Nydia Distance M.D. [name] 03/25/2024, 1:54 PM  Available via Epic secure chat 7am-7pm After 7 pm, please refer to night coverage provider listed on amion.

## 2024-03-25 NOTE — Progress Notes (Signed)
 OT Cancellation Note  Patient Details Name: [name] [name] MRN: [id number] DOB: [birth year]   Cancelled Treatment:    Reason Eval/Treat Not Completed: Other (comment) (Pt sleeping soundly. Nsg states she has had a busy morning and has had pain meds and requesting to sleepin. Will follow up on a  later date.)  [facility] 03/25/2024, 4:29 PM Kreg Sink, OT/L   Acute OT Clinical Specialist Acute Rehabilitation Services Pager [phone] Office [phone]

## 2024-03-25 NOTE — Progress Notes (Signed)
 This chaplain is present for F/U spiritual care in the setting of creating the Pt. HCPOA.  The Pt. friend-Nakema is at the bedside along with PMT NP-Eric, and RN-Nukol.  The Pt. answered the NP clarifying questions after HCPOA education and named Nori Eland as her health care agent. The notary and witnesses were called to notarize  the Pt.Advance Directive: HCPOA only. The Pt. did not complete a Living Will.  The chaplain gave the Pt. the original AD along with one copy. The chaplain scanned the Pt. AD into the Pt. EMR.  This chaplain is available for F/U spiritual care as needed.  Chaplain Leeroy Hummer [phone]

## 2024-03-25 NOTE — Plan of Care (Signed)
  Problem: Clinical Measurements: Goal: Ability to maintain clinical measurements within normal limits will improve Outcome: Progressing Goal: Will remain free from infection Outcome: Progressing Goal: Diagnostic test results will improve Outcome: Progressing Goal: Respiratory complications will improve Outcome: Progressing   Problem: Activity: Goal: Risk for activity intolerance will decrease Outcome: Progressing   Problem: Nutrition: Goal: Adequate nutrition will be maintained Outcome: Progressing   Problem: Coping: Goal: Level of anxiety will decrease Outcome: Progressing   Problem: Elimination: Goal: Will not experience complications related to bowel motility Outcome: Progressing   Problem: Pain Managment: Goal: General experience of comfort will improve and/or be controlled Outcome: Progressing   Problem: Safety: Goal: Ability to remain free from injury will improve Outcome: Progressing   Problem: Skin Integrity: Goal: Risk for impaired skin integrity will decrease Outcome: Progressing   Problem: Coping: Goal: Ability to adjust to condition or change in health will improve Outcome: Progressing   Problem: Metabolic: Goal: Ability to maintain appropriate glucose levels will improve Outcome: Progressing   Problem: Nutritional: Goal: Maintenance of adequate nutrition will improve Outcome: Progressing Goal: Progress toward achieving an optimal weight will improve Outcome: Progressing   Problem: Skin Integrity: Goal: Risk for impaired skin integrity will decrease Outcome: Progressing   Problem: Education: Goal: Knowledge of secondary prevention will improve (MUST DOCUMENT ALL) Outcome: Progressing Goal: Knowledge of patient specific risk factors will improve (DELETE if not current risk factor) Outcome: Progressing   Problem: Intracerebral Hemorrhage Tissue Perfusion: Goal: Complications of Intracerebral Hemorrhage will be minimized Outcome: Progressing    Problem: Health Behavior/Discharge Planning: Goal: Goals will be collaboratively established with patient/family Outcome: Progressing   Problem: Self-Care: Goal: Ability to communicate needs accurately will improve Outcome: Progressing   Problem: Nutrition: Goal: Risk of aspiration will decrease Outcome: Progressing Goal: Dietary intake will improve Outcome: Progressing   Problem: Education: Goal: Knowledge of disease and its progression will improve Outcome: Progressing Goal: Individualized Educational Video(s) Outcome: Progressing   Problem: Fluid Volume: Goal: Compliance with measures to maintain balanced fluid volume will improve Outcome: Progressing   Problem: Health Behavior/Discharge Planning: Goal: Ability to manage health-related needs will improve Outcome: Progressing   Problem: Nutritional: Goal: Ability to make healthy dietary choices will improve Outcome: Progressing   Problem: Clinical Measurements: Goal: Complications related to the disease process, condition or treatment will be avoided or minimized Outcome: Progressing

## 2024-03-25 NOTE — Plan of Care (Signed)
 Problem: Clinical Measurements: Goal: Ability to maintain clinical measurements within normal limits will improve 03/25/2024 0351 by [name] Kenneth SAILOR, RN Outcome: Progressing 03/24/2024 2018 by [name] Kenneth SAILOR, RN Outcome: Progressing Goal: Will remain free from infection 03/25/2024 0351 by [name] Kenneth SAILOR, RN Outcome: Progressing 03/24/2024 2018 by [name] Kenneth SAILOR, RN Outcome: Progressing Goal: Diagnostic test results will improve 03/25/2024 0351 by [name] Kenneth SAILOR, RN Outcome: Progressing 03/24/2024 2018 by [name] Kenneth SAILOR, RN Outcome: Progressing Goal: Respiratory complications will improve 03/25/2024 0351 by [name] Kenneth SAILOR, RN Outcome: Progressing 03/24/2024 2018 by [name] Kenneth SAILOR, RN Outcome: Progressing   Problem: Activity: Goal: Risk for activity intolerance will decrease 03/25/2024 0351 by [name] Kenneth SAILOR, RN Outcome: Progressing 03/24/2024 2018 by [name] Kenneth SAILOR, RN Outcome: Progressing   Problem: Nutrition: Goal: Adequate nutrition will be maintained 03/25/2024 0351 by [name] Kenneth SAILOR, RN Outcome: Progressing 03/24/2024 2018 by [name] Kenneth SAILOR, RN Outcome: Progressing   Problem: Coping: Goal: Level of anxiety will decrease 03/25/2024 0351 by [name] Kenneth SAILOR, RN Outcome: Progressing 03/24/2024 2018 by [name] Kenneth SAILOR, RN Outcome: Progressing   Problem: Elimination: Goal: Will not experience complications related to bowel motility 03/25/2024 0351 by [name] Kenneth SAILOR, RN Outcome: Progressing 03/24/2024 2018 by [name] Kenneth SAILOR, RN Outcome: Progressing   Problem: Pain Managment: Goal: General experience of comfort will improve and/or be controlled 03/25/2024 0351 by [name] Kenneth SAILOR, RN Outcome: Progressing 03/24/2024 2018 by [name] Kenneth SAILOR, RN Outcome: Progressing   Problem: Safety: Goal: Ability to remain free from injury will improve 03/25/2024 0351 by [name] Kenneth SAILOR, RN Outcome: Progressing 03/24/2024 2018  by [name] Kenneth SAILOR, RN Outcome: Progressing   Problem: Skin Integrity: Goal: Risk for impaired skin integrity will decrease 03/25/2024 0351 by [name] Kenneth SAILOR, RN Outcome: Progressing 03/24/2024 2018 by [name] Kenneth SAILOR, RN Outcome: Progressing   Problem: Coping: Goal: Ability to adjust to condition or change in health will improve 03/25/2024 0351 by [name] Kenneth SAILOR, RN Outcome: Progressing 03/24/2024 2018 by [name] Kenneth SAILOR, RN Outcome: Progressing   Problem: Metabolic: Goal: Ability to maintain appropriate glucose levels will improve 03/25/2024 0351 by [name] Kenneth SAILOR, RN Outcome: Progressing 03/24/2024 2018 by [name] Kenneth SAILOR, RN Outcome: Progressing   Problem: Nutritional: Goal: Maintenance of adequate nutrition will improve 03/25/2024 0351 by [name] Kenneth SAILOR, RN Outcome: Progressing 03/24/2024 2018 by [name] Kenneth SAILOR, RN Outcome: Progressing Goal: Progress toward achieving an optimal weight will improve 03/25/2024 0351 by [name] Kenneth SAILOR, RN Outcome: Progressing 03/24/2024 2018 by [name] Kenneth SAILOR, RN Outcome: Progressing   Problem: Skin Integrity: Goal: Risk for impaired skin integrity will decrease 03/25/2024 0351 by [name] Kenneth SAILOR, RN Outcome: Progressing 03/24/2024 2018 by [name] Kenneth SAILOR, RN Outcome: Progressing   Problem: Education: Goal: Knowledge of secondary prevention will improve (MUST DOCUMENT ALL) 03/25/2024 0351 by [name] Kenneth SAILOR, RN Outcome: Progressing 03/24/2024 2018 by [name] Kenneth SAILOR, RN Outcome: Progressing Goal: Knowledge of patient specific risk factors will improve (DELETE if not current risk factor) 03/25/2024 0351 by [name] Kenneth SAILOR, RN Outcome: Progressing 03/24/2024 2018 by [name] Kenneth SAILOR, RN Outcome: Progressing   Problem: Intracerebral Hemorrhage Tissue Perfusion: Goal: Complications of Intracerebral Hemorrhage will be minimized 03/25/2024 0351 by [name] Kenneth SAILOR, RN Outcome:  Progressing 03/24/2024 2018 by [name] Kenneth SAILOR, RN Outcome: Progressing   Problem: Health Behavior/Discharge Planning: Goal: Goals will be collaboratively established with patient/family 03/25/2024 0351 by [name] Kenneth SAILOR, RN Outcome: Progressing 03/24/2024 2018 by [name] Kenneth SAILOR, RN Outcome: Progressing  Problem: Self-Care: Goal: Ability to communicate needs accurately will improve 03/25/2024 0351 by [name] Kenneth SAILOR, RN Outcome: Progressing 03/24/2024 2018 by [name] Kenneth SAILOR, RN Outcome: Progressing   Problem: Nutrition: Goal: Risk of aspiration will decrease 03/25/2024 0351 by [name] Kenneth SAILOR, RN Outcome: Progressing 03/24/2024 2018 by [name] Kenneth SAILOR, RN Outcome: Progressing Goal: Dietary intake will improve 03/25/2024 0351 by [name] Kenneth SAILOR, RN Outcome: Progressing 03/24/2024 2018 by [name] Kenneth SAILOR, RN Outcome: Progressing

## 2024-03-25 NOTE — Progress Notes (Signed)
 [facility] Progress Note   Subjective:    Seen and examined patient at bedside. Patient recently vomited at the bedside. G-tube in place. Opens eyes and answered to voice. Next HD 7/23.  Objective Vitals:   03/25/24 1151 03/25/24 1358 03/25/24 1544 03/25/24 1548  BP: 136/62  (!) 168/73 (!) 168/73  Pulse: 94 95 99 100  Resp: 17 11 18 20   Temp: 98.4 F (36.9 C)  99.1 F (37.3 C)   TempSrc: Axillary  Oral   SpO2: 96% 98% 94% 94%  Weight:      Height:       Physical Exam General adult female in bed in NAD; opens eyes and answers to voice  Lungs clear to auscultation bilaterally normal work of breathing at rest; on room air Heart S1S2 tachycardic no rub Abdomen soft nontender nondistended Extremities no edema  Access RUE AVF with bruit and thrill; aneurysmal region  [facility] Weights   03/23/24 0500 03/24/24 0113 03/25/24 0500  Weight: 51.8 kg 52 kg 52 kg    Intake/Output Summary (Last 24 hours) at 03/25/2024 1801 Last data filed at 03/25/2024 1200 Gross per 24 hour  Intake 351 ml  Output 100 ml  Net 251 ml    Additional Objective Labs: Basic Metabolic Panel: Recent Labs  Lab 03/23/24 0533 03/23/24 1017 03/24/24 0708 03/24/24 1139 03/25/24 0610 03/25/24 0919 03/25/24 1223 03/25/24 1658  NA 130*  --  133*  --  129*  --   --   --   K 4.6   < > 3.3*   < > 3.5 3.7 4.0 4.3  CL 92*  --  90*  --  88*  --   --   --   CO2 23  --  27  --  30  --   --   --   GLUCOSE 112*  --  117*  --  130*  --   --   --   BUN 45*  --  14  --  28*  --   --   --   CREATININE 7.73*  --  3.65*  --  5.23*  --   --   --   CALCIUM  9.8  --  9.4  --  10.0  --   --   --   PHOS 6.0*  --  3.5  --  4.3  --   --   --    < > = values in this interval not displayed.   Liver Function Tests: Recent Labs  Lab 03/19/24 0448 03/20/24 0540 03/23/24 0533 03/24/24 0708 03/25/24 0610  AST 20  --   --   --   --   ALT 11  --   --   --   --   ALKPHOS 111  --   --   --   --   BILITOT 0.5   --   --   --   --   PROT 6.3*  --   --   --   --   ALBUMIN 2.8*   < > 2.4* 2.6* 2.4*   < > = values in this interval not displayed.   No results for input(s): LIPASE, AMYLASE in the last 168 hours. CBC: Recent Labs  Lab 03/19/24 0448 03/20/24 0540 03/21/24 0412 03/22/24 0421 03/23/24 0540 03/24/24 0708  WBC 5.4 6.3 5.1 4.9 3.9* 5.6  NEUTROABS 3.4  --   --   --   --   --   HGB  9.0* 8.8* 9.0* 8.9* 8.5* 9.2*  HCT 27.5* 27.2* 27.8* 27.3* 26.0* 27.4*  MCV 95.8 97.8 96.9 97.8 95.9 94.2  PLT 247 210 207 220 203 220   Blood Culture    Component Value Date/Time   SDES BACK 03/13/2024 1040   SPECREQUEST NONE 03/13/2024 1040   CULT  03/13/2024 1040    NO GROWTH 3 DAYS Performed at [facility], [address], [place], [name] [address]    REPTSTATUS 03/16/2024 FINAL 03/13/2024 1040    Cardiac Enzymes: No results for input(s): CKTOTAL, CKMB, CKMBINDEX, TROPONINI in the last 168 hours. CBG: Recent Labs  Lab 03/24/24 2322 03/25/24 0311 03/25/24 0757 03/25/24 1150 03/25/24 1542  GLUCAP 115* 133* 131* 148* 131*   Iron  Studies: No results for input(s): IRON , TIBC, TRANSFERRIN, FERRITIN in the last 72 hours. Lab Results  Component Value Date   INR 1.3 (H) 02/20/2024   INR 1.3 (H) 02/14/2024   INR 1.2 06/13/2023   Studies/Results: No results found.  Medications:  feeding supplement (NEPRO CARB STEADY) 45 mL/hr at 03/25/24 1200    amLODipine   10 mg Per Tube Daily   aspirin   81 mg Per Tube Daily   Chlorhexidine  Gluconate Cloth  6 each Topical Q0600   Chlorhexidine  Gluconate Cloth  6 each Topical Q0600   Chlorhexidine  Gluconate Cloth  6 each Topical Q0600   cloNIDine   0.1 mg Transdermal Weekly   cycloSPORINE   200 mg Per Tube QHS   cycloSPORINE   225 mg Per Tube Daily   darbepoetin (ARANESP ) injection - DIALYSIS  100 mcg Subcutaneous Q Fri-1800   feeding supplement  237 mL Per Tube BID BM   heparin  injection (subcutaneous)  5,000 Units  Subcutaneous Q8H   insulin  aspart  0-9 Units Subcutaneous Q4H   lacosamide   100 mg Per Tube BID   metoCLOPramide  (REGLAN ) injection  5 mg Intravenous Q8H   metoprolol  tartrate  50 mg Per Tube BID   multivitamin  1 tablet Per Tube QHS   mycophenolate   500 mg Per Tube BID   predniSONE   15 mg Oral Q breakfast   sevelamer  carbonate  2.4 g Per Tube TID WC   sodium chloride  flush  3 mL Intravenous Q12H    Dialysis Orders: MWF AF 3h  B400   57.2kg  2K   AVF  Heparin  none - Hectoral 4mcg IV q HD - Mircera 150mcg IV q 2 weeks (last given 6/9 -> due 6/23)  Assessment/Plan: Intracerebral hemorrhage: C/b hydrocephalus, 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 TCDs with evidence of vasospasm 02/11/24. Followed closely by neurosurgery, evd removed 6/30 CVA: New CVA found on 7/1 ESRD:  HD per MWF schedule usually. Refused HD on 7/19 but received treatment early morning of 7/21 (1.5L removed). Continue MWF per her routine schedule. Next HD 7/23. S/p CRRT 6/12-6/15, then back on iHD.     Hyperkalemia: resolved w/ TF changed to Nepro Volume: no vol excess on exam. Note she is below her EDW.  HTN:  optimize volume with HD.  Started back amlodipine  at 5 mg daily on 7/15 and increased to 10 mg nightly on 7/17 (evening dosing preferred due to variable HD schedule).  Note that her amlodipine  was not given on 7/17 or 7/18 as no enteric access. Anemia of ESRD: increased aranesp  to 100 mcg every Friday. Iron  adequate Secondary HPTH: Ca and Phos high, VDRA stopped 6/21, added sevelamer  via tube. 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 CyA level 7/09 was 109 (goal 100-200) which is in range.   Team is getting enteric access again - transplant meds would be essential.  She was temporarily converted to IV meds for immunosuppression   Dispo - per primary team.  Team states that  plan likely for LTAC once more permanent enteric access obtained   [name], NP [facility] 03/25/2024,6:01 PM  LOS: 40 days

## 2024-03-25 NOTE — Progress Notes (Signed)
 Daily Progress Note   Date: 03/25/2024   Patient Name: [name] [name]  DOB: 1980/11/02  MRN: [id number]  Age / Sex: 43 y.o., female  Attending Physician: [name] [name] POUR, MD Primary Care Physician: [facility]mit Date: 02/13/2024 Length of Stay: 40 days  Reason for Consultation: Establishing goals of care  Past Medical History:  Diagnosis Date   Anemia    Anxiety    DM (diabetes mellitus), type 1 (HCC)    ESRD on hemodialysis (HCC)    Gastroparesis    History of simultaneous kidney and pancreas transplant (HCC) 2014   Hypertension    Ischemic cerebrovascular accident (CVA) (HCC) 11/2021   Narcotic abuse (HCC)    Non compliance with medical treatment    Stroke (HCC)    Vision loss     Subjective:   Subjective: Chart Reviewed. Updates received. Patient Assessed. Created space and opportunity for patient  and family to explore thoughts and feelings regarding current medical situation.  Today's Discussion: Today before meeting with the patient/family, I reviewed the chart including internal medicine note from today, nephrology note from today.  The patient is on a TTS dialysis schedule for this week and then back to MWF after that (patient refused dialysis on 7/19).  Thus far patient has desired full code and full scope of care including G-tube/J-tube placement.  Have been attempting to complete advance directives naming friend [name] [name] as HCPOA.  I also reviewed labs today including CBC which shows no leukocytosis, stable hemoglobin at 8.6.  Renal function panel shows persistent but stable hyponatremia at 130 this morning, expected increase in creatinine given ESRD at 6.29 today, BUN increased to 41; yesterday BUN/creatinine was 28/5.23.  I went see the patient along with chaplain [name] [name], her friend [name] [name] was present.  The patient was sleeping but awakened easily to voice and light touch, although she was quite sleepy and intermittently fell asleep during  her visit.  However, when we are ready to complete HCPOA she was very weak and Dieul[place]oy was having a conversation.  Today she states her pain and nausea are about the same. Patient's friend shared information abotu her mental status later in the day. States that while she's more somnolent earlier in the day she often speaks with family via FaceTime with perfect clarity for over an hour.  [name] [name] [name] and I both spent time explaining HCPOA documentation.  She is agreeable to proceed.  We spent time asking clarifying questions and confirming that she has capacity to make this decision today, which I feel she does.  We also spent time ensuring that she understood what HCPOA meant and what she was authorizing her HCPOA today.  She seems to have a very firm grasp on this.  She has selected her friend [name] [name] as her HCPOA sheet.  She declines any backup or alternative HCPOA at this time.  She does not wish to complete a living well.  At this point chaplain [name] assisted [facility] in completing informational portions of the HCPOA document.  At this point I did a physical exam and x-rays myself in the room while the chaplain [name] contacted volunteer services and a notary to complete signing of the document.  I shared that I would back off at this point as goals are clear and advance directives has been completed.   provided emotional and general support through therapeutic listening, empathy, sharing of stories, therapeutic touch, and other techniques. I answered all questions and addressed all concerns to  the best of my ability.  After seeing the patient I debriefed with the medical team.  I also spoke with the admissions coordinator at [facility] who indicates they are sending an insurance authorization and hopeful to transfer the patient to select specialty LTAC later this week for ongoing rehab, medical care.  Review of Systems  Constitutional:  Positive for fatigue.       Pain in general  is stable/unchanged  Gastrointestinal:  Positive for nausea (Unchanged). Negative for abdominal pain.  Neurological:  Positive for weakness.    Objective:   Primary Diagnoses: Present on Admission:  (Resolved) Intractable nausea and vomiting  Colitis  Acute respiratory failure with hypoxia (HCC)  Hyperkalemia  Acute metabolic encephalopathy  Hypertensive urgency  Prolonged QT interval  Pancytopenia (HCC)  Controlled type 2 diabetes mellitus with complication, without long-term current use of insulin  (HCC)  Abnormal CT of the abdomen  Sepsis (HCC)   Vital Signs:  BP 136/62 (BP Location: Left Wrist)   Pulse 94   Temp 98.4 F (36.9 C) (Axillary)   Resp 17   Ht 5' 7.5 (1.715 m)   Wt 52 kg   LMP 02/13/2024 (Exact Date)   SpO2 96%   BMI 17.69 kg/m   Physical Exam Vitals and nursing note reviewed.  Constitutional:      General: She is sleeping. She is not in acute distress.    Comments: No objective signs of discomfort or distress  Cardiovascular:     Rate and Rhythm: Normal rate.  Pulmonary:     Effort: Pulmonary effort is normal. No respiratory distress.  Abdominal:     General: Abdomen is flat.  Skin:    General: Skin is warm and dry.     Palliative Assessment/Data: 10% (not tolerating any oral intake)   Existing Vynca/ACP Documentation: None  Assessment & Plan:   HPI/Patient Profile:  43 y.o. 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  She was admitted on 02/13/2024 with possible sepsis secondary to colitis, abdominal pain, nausea, vomiting, history of gastroparesis, acute respiratory failure with hypoxia secondary to volume overload history of pancreatic/renal transplant in 2015, ESRD on HD, acute metabolic encephalopathy, hypertensive urgency, and others.    She has had a very complicated admission as outlined below:    Significant Hospital Events:  6/11 ED w n/v AMS. Abx IVF for colitis 6/12 Admit. AMS decr LOC. Incr O2 req. PCCM consult and code stroke called seen w rapid response nephro NP then neuro  6/13 Intubated with EVD, responding to commands  6/14 Extubated 6/17 acute L cerebellar infarct. Keppra  dc  6/18 Dialysis, fevering, EVD at 10 (129 mL last 24 hrs) 6/19 Narcotic dependence a driving factor. +Vasospasm on TCD. CTA with evolving Left cerebellar infarct, no HT or mass effect, substantially decreased caliber of multiple intracranial arteries and bilateral Circle-of[place]is branches (especially: Distal left V4, supraclinoid ICAs, MCA and ACA origins), appearance c/w widespread Vasospasm, Vasoconstriction. No associated large vessel occlusion. 6/21 CSF sampline from EVD -- WBC 244 RBC 19000, cx ngtd  6/25: diagnostic angio today with nsgy - negative 6/26 evd leaking when clamped  6/30 evd removed, HD overnight, HTN improved, 7 days cefazolin  complete 7/1 more lethargic> minimal response to narcan  x2, oxy stopped, EEG neg, MRI - new small left parietal cva, cortrak placed postpyloric, vagal episodes with N/V 7/2 iHD,  vimpat  started 7/9 she appears agitated.  IVs were removed.  LP to be initiated to evaluate her opening pressure and to ensure that she does not have meningitis if this is still pending to be done. 7/10 LP done at bedside by Neurosurgery. Cortrak was removed yesterday evening and replaced by Fluoro team today.  7/11 Cortrak obstructed and reomved and had to be replaced by Flouro. Calorie Count initiated. Undergoing Dialysis today  7/12: Having chest discomfort. Palliative Care Consulted for GOC Discussion 7/14: Agreeable to J-tube with venting G-tube. IR consulted. SLP continues to recommend Thin (Full) Liquids 7/16: G-tube placed   Palliative medicine was consulted for GOC conversations.  SUMMARY OF RECOMMENDATIONS   Full code Full scope of care Continue attempts at HCPOA  completion, will reattempt tomorrow Attempt to contact quested HCPOA Nakema Bar to inform her of patient's request/decision Palliative medicine will continue to follow and support  Symptom Management:  Per primary team PMT is available to assist as needed  Code Status: Full code  Prognosis: Unable to determine  Discharge Planning: LTAC  Discussed with: Medical team, nursing team, chaplain, patient, patient's friend  Thank you for allowing us  to participate in the care of [name] [name] PMT will continue to support holistically.  Billing based on MDM: High  Problems Addressed: One acute or chronic illness or injury that poses a threat to life or bodily function  Amount and/or Complexity of Data: Category 1:Review of prior external note(s) from each unique source, Review of the result(s) of each unique test, and Assessment requiring an independent historian(s) and Category 3:Discussion of management or test interpretation with external physician/other qualified health care professional/appropriate source (not separately reported)  Risks: N/A  Detailed review of medical records (labs, imaging, vital signs), medically appropriate exam, discussed with treatment team, counseling and education to patient, family, & staff, documenting clinical information, medication management, coordination of care  Camellia Kays, NP Palliative Medicine Team  Team Phone # [phone] (Nights/Weekends)  05/03/2021, 8:17 AM

## 2024-03-26 DIAGNOSIS — I609 Nontraumatic subarachnoid hemorrhage, unspecified: Secondary | ICD-10-CM | POA: Diagnosis not present

## 2024-03-26 LAB — RENAL FUNCTION PANEL
Albumin: 2.4 g/dL — ABNORMAL LOW (ref 3.5–5.0)
Anion gap: 13 (ref 5–15)
BUN: 41 mg/dL — ABNORMAL HIGH (ref 6–20)
CO2: 31 mmol/L (ref 22–32)
Calcium: 10.1 mg/dL (ref 8.9–10.3)
Chloride: 86 mmol/L — ABNORMAL LOW (ref 98–111)
Creatinine, Ser: 6.29 mg/dL — ABNORMAL HIGH (ref 0.44–1.00)
GFR, Estimated: 8 mL/min — ABNORMAL LOW (ref >60)
Glucose, Bld: 112 mg/dL — ABNORMAL HIGH (ref 70–99)
Phosphorus: 4.5 mg/dL (ref 2.5–4.6)
Potassium: 4.1 mmol/L (ref 3.5–5.1)
Sodium: 130 mmol/L — ABNORMAL LOW (ref 135–145)

## 2024-03-26 LAB — CBC
HCT: 25.6 % — ABNORMAL LOW (ref 36.0–46.0)
Hemoglobin: 8.6 g/dL — ABNORMAL LOW (ref 12.0–15.0)
MCH: 32.3 pg (ref 26.0–34.0)
MCHC: 33.6 g/dL (ref 30.0–36.0)
MCV: 96.2 fL (ref 80.0–100.0)
Platelets: 204 K/uL (ref 150–400)
RBC: 2.66 MIL/uL — ABNORMAL LOW (ref 3.87–5.11)
RDW: 15 % (ref 11.5–15.5)
WBC: 6 K/uL (ref 4.0–10.5)
nRBC: 0 % (ref 0.0–0.2)

## 2024-03-26 LAB — GLUCOSE, CAPILLARY
Glucose-Capillary: 117 mg/dL — ABNORMAL HIGH (ref 70–99)
Glucose-Capillary: 132 mg/dL — ABNORMAL HIGH (ref 70–99)
Glucose-Capillary: 145 mg/dL — ABNORMAL HIGH (ref 70–99)
Glucose-Capillary: 115 mg/dL — ABNORMAL HIGH (ref 70–99)
Glucose-Capillary: 119 mg/dL — ABNORMAL HIGH (ref 70–99)
Glucose-Capillary: 140 mg/dL — ABNORMAL HIGH (ref 70–99)

## 2024-03-26 LAB — POTASSIUM
Potassium: 4.1 mmol/L (ref 3.5–5.1)
Potassium: 4.2 mmol/L (ref 3.5–5.1)
Potassium: 4.2 mmol/L (ref 3.5–5.1)
Potassium: 4.2 mmol/L (ref 3.5–5.1)

## 2024-03-26 MED ORDER — ALTEPLASE 2 MG IJ SOLR: 2.0000 mg | Freq: Once | INTRAMUSCULAR | Status: DC | PRN

## 2024-03-26 MED ORDER — LIDOCAINE-PRILOCAINE 2.5-2.5 % EX CREA: 1.0000 | TOPICAL_CREAM | CUTANEOUS | Status: DC | PRN

## 2024-03-26 MED ORDER — PENTAFLUOROPROP-TETRAFLUOROETH EX AERO: 1.0000 | INHALATION_SPRAY | CUTANEOUS | Status: DC | PRN

## 2024-03-26 MED ORDER — LIDOCAINE HCL (PF) 1 % IJ SOLN: 5.0000 mL | INTRAMUSCULAR | Status: DC | PRN

## 2024-03-26 MED ORDER — PENTAFLUOROPROP-TETRAFLUOROETH EX AERO
INHALATION_SPRAY | CUTANEOUS | Status: AC
  Filled 2024-03-26: qty 30

## 2024-03-26 MED ORDER — NEPRO/CARBSTEADY PO LIQD: 237.0000 mL | ORAL | Status: DC | PRN

## 2024-03-26 MED ORDER — ANTICOAGULANT SODIUM CITRATE 4% (200MG/5ML) IV SOLN: 5.0000 mL | Status: DC | PRN

## 2024-03-26 MED ORDER — HEPARIN SODIUM (PORCINE) 1000 UNIT/ML DIALYSIS: 1000.0000 [IU] | INTRAMUSCULAR | Status: DC | PRN

## 2024-03-26 MED ORDER — HYDROMORPHONE HCL 1 MG/ML IJ SOLN
INTRAMUSCULAR | Status: AC
  Filled 2024-03-26: qty 0.5

## 2024-03-26 NOTE — Progress Notes (Signed)
 OT Cancellation Note  Patient Details Name: Murray Guzzetta MRN: [id number] DOB: 04/01/1981   Cancelled Treatment:    Reason Eval/Treat Not Completed: Patient at procedure or test/ unavailable (at HD, will follow up for OT tx as schedule permits)  Tashan Kreitzer K, OTD, OTR/L SecureChat Preferred Acute Rehab [phone]   Laneta MARLA Pereyra 03/26/2024, 10:34 AM

## 2024-03-26 NOTE — Progress Notes (Signed)
 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 Hypertensive in ED SBP 240s.  CT scan suggested colitis.  Patient's mentation continued to worsen. CT head was subsequently done which revealed intracranial hemorrhage.  Patient was admitted to the ICU.    In brief-complicated history including prior failed renal/ pancreas transplant, ESRD MWF with SAH/ICH, Palliative following for goals of care (No family in town, parents in [place]. Visa to come to the [place]  was denied and here by herself now).  G tube placed by IR but needed GJ, now placed postpyloric cortrack.  Eventually will need need LTACH/select.        Significant Hospital Events:  6/11 ED w n/v AMS. Abx IVF for colitis 6/12 Admit. AMS decr LOC. Incr O2 req. PCCM consult and code stroke called seen w rapid response nephro NP then neuro  6/13 Intubated with EVD, responding to commands  6/14 Extubated 6/17 acute L cerebellar infarct. Keppra  dc  6/18 Dialysis, fevering, EVD at 10 (129 mL last 24 hrs) 6/19 Narcotic dependence a driving factor. +Vasospasm on TCD. CTA with evolving Left cerebellar infarct, no HT or mass effect, substantially decreased caliber of multiple intracranial arteries and bilateral [place] branches (especially: Distal left V4, supraclinoid ICAs, MCA and ACA origins), appearance c/w widespread Vasospasm, Vasoconstriction. No associated large vessel occlusion. 6/21 CSF sampline from EVD -- WBC 244 RBC 19000, cx ngtd  6/25: diagnostic angio today with nsgy - negative 6/26 evd leaking when clamped  6/30 evd removed, HD overnight, HTN improved, 7 days cefazolin  complete 7/1 more lethargic> minimal response to narcan  x2, oxy stopped, EEG neg, MRI - new small  left parietal cva, cortrak placed postpyloric, vagal episodes with N/V 7/2 iHD, vimpat  started 7/9 she appears agitated.  IVs were removed.  LP to be initiated to evaluate her opening pressure and to ensure that she does not have meningitis if this is still pending to be done. 7/10 LP done at bedside by Neurosurgery. Cortrak was removed yesterday evening and replaced by Fluoro team today.  7/11 Cortrak obstructed and reomved and had to be replaced by Flouro. Calorie Count initiated. Undergoing Dialysis today  7/12: Having chest discomfort. Palliative Care Consulted for GOC Discussion 7/14: Agreeable to J-tube with venting G-tube. IR consulted. SLP continues to recommend Thin (Full) Liquids   Subjective: Seen in HDA alert  No complains, cortrak_ Moving legs on commands Overnight mild tachycardia 106 BP 140s-180s, on room air.  Labs reviewed BUN 14 creatinine 6.2 sodium 130 normal potassium CBC with hemoglobin 8.6 normal WBC  Assessment and plan:  SAH, ICH with brain compression, communicating hydrocephalus: Extensive hospitalization and consultation by neurology and neurosurgery s/p right frontal pressure ventriculostomy via burr hole and EVD was removed on 6/30. MRI brain-small foci of hemorrhage in the posterior left cerebellum without evidence for acute infarct. Punctate focus of acute infarct involving the medial left occipital cortex, evolving subacute infarct in the left parietal lobe, trace blood products. Due to concern for hydrocephalus, neurosurgery did LP, spinal fluid pressures were low and ruled out meningitis. Per Dr. Mavis, she would not benefit from a VP shunt  Palliative medicine following for GOC Continue supportive care.   Oropharyngeal Dysphagia: History of severe gastroparesis with nausea and vomiting Moderate malnutrition: Speech following closely G-tube  placed by IR but needed GJ (after tract heals in about 4 weeks for jejunal feeds and gastric venting), now on  postpyloric feeding w/ cortrak and FLD Continue Reglan  5 mg IV every 8 hours, continue pain management   Concern for seizure activity: Evaluated by neurology, underwent EEG.  Continue with Vimpat , seizure precautions   Vasospasm with left cerebellar infarct:  Seen by Neurology.LDL 63.  HbA1c 4.2. CONT PTOT  will benefit in the LTAC/select facility given hemodialysis and ongoing significant medical issues.   ESRD on hemodialysis, MWF Hyperkalemia nephrology following continue dialysis. chronically immunosuppressed with CellCept , Cyclosporine , and Prednisone     Essential Hypertension: Midodrine  has been discontinued. . Continue clonidine  patch, Norvasc  10 mg daily, Lopressor  50 mg twice daily    Chest Pain Atypical, 2D echo showed EF normal 60 to 65%, with no regional WMA, mild LVH Continue pain control as needed   Bradycardia:  Secondary to vagal response. Resolved.   Abdominal Discomfort and Chest Discomfort:  Continue pain control as needed     Colitis: Stable.    Status post Pancreatic Transplant/Diabetes mellitus Continue sliding scale insulin  Recent Labs  Lab 03/25/24 1150 03/25/24 1542 03/25/24 1944 03/25/24 2313 03/26/24 0306  GLUCAP 148* 131* 111* 117* 132*     Anemia of Chronic Disease H&H stable   DVT prophylaxis: heparin  injection 5,000 Units Start: 03/14/24 0600 SCDs Start: 02/14/24 0725 Code Status:   Code Status: Full Code Family Communication: plan of care discussed with patient at bedside. Patient status is: Remains hospitalized because of severity of illness Level of care: Progressive   Dispo: The patient is from: home            Anticipated disposition: TBD- needs ltach Objective: Vitals last 24 hrs: Vitals:   03/26/24 1100 03/26/24 1130 03/26/24 1149 03/26/24 1150  BP: (!) 152/61 (!) 150/54 (!) 137/51 (!) 162/48  Pulse: (!) 101 (!) 105 (!) 111 (!) 106  Resp: (!) 9 15 (!) 6 (!) 6  Temp:    98.1 F (36.7 C)  TempSrc:      SpO2: 94% 98%  94% 95%  Weight:    51.8 kg  Height:        Physical Examination: General exam: alert awake, older than stated age, cortrak+ HEENT:Oral mucosa moist, Ear/Nose WNL grossly Respiratory system: Bilaterally clear BS, no use of accessory muscle Cardiovascular system: S1 & S2 +. Gastrointestinal system: Abdomen soft, PEG+, NT,ND,BS+ Nervous System: Alert, awake, following commands. Extremities: LE edema neg, warm extremities Skin: No rashes,warm. MSK: WEAK THIN  Data Reviewed: I have personally reviewed following labs and imaging studies ( see epic result tab) CBC: Recent Labs  Lab 03/21/24 0412 03/22/24 0421 03/23/24 0540 03/24/24 0708 03/26/24 0551  WBC 5.1 4.9 3.9* 5.6 6.0  HGB 9.0* 8.9* 8.5* 9.2* 8.6*  HCT 27.8* 27.3* 26.0* 27.4* 25.6*  MCV 96.9 97.8 95.9 94.2 96.2  PLT 207 220 203 220 204   CMP: Recent Labs  Lab 03/22/24 0421 03/22/24 2013 03/23/24 0026 03/23/24 0533 03/23/24 1017 03/24/24 0708 03/24/24 1139 03/25/24 0610 03/25/24 0919 03/25/24 1223 03/25/24 1658 03/25/24 2042 03/25/24 2343 03/26/24 0552  NA 134* 132*  --  130*  --  133*  --  129*  --   --   --   --   --  130*  K 4.7 5.3*   < > 4.6   < > 3.3*   < > 3.5   < > 4.0 4.3 4.2 4.1 4.1  CL 95* 92*  --  92*  --  90*  --  88*  --   --   --   --   --  86*  CO2 24 22  --  23  --  27  --  30  --   --   --   --   --  31  GLUCOSE 84 121*  --  112*  --  117*  --  130*  --   --   --   --   --  112*  BUN 37* 44*  --  45*  --  14  --  28*  --   --   --   --   --  41*  CREATININE 6.73* 7.67*  --  7.73*  --  3.65*  --  5.23*  --   --   --   --   --  6.29*  CALCIUM  9.8 9.9  --  9.8  --  9.4  --  10.0  --   --   --   --   --  10.1  PHOS 5.3*  --   --  6.0*  --  3.5  --  4.3  --   --   --   --   --  4.5   < > = values in this interval not displayed.   GFR: Estimated Creatinine Clearance: 9.5 mL/min (A) (by C-G formula based on SCr of 6.29 mg/dL (H)). Recent Labs  Lab 03/22/24 0421 03/23/24 0533 03/24/24 0708  03/25/24 0610 03/26/24 0552  ALBUMIN 2.5* 2.4* 2.6* 2.4* 2.4*   No results for input(s): LIPASE, AMYLASE in the last 168 hours. No results for input(s): AMMONIA in the last 168 hours. Coagulation Profile: No results for input(s): INR, PROTIME in the last 168 hours. Unresulted Labs (From admission, onward)     Start     Ordered   03/26/24 0500  CBC  Daily,   R     Question:  Specimen collection method  Answer:  Lab=Lab collect   03/25/24 1357   03/22/24 2052  Potassium  STAT Now then every 4 hours ,   R (with STAT occurrences)     Comments: Until normal twice.   Question:  Specimen collection method  Answer:  Lab=Lab collect   03/22/24 2052           Antimicrobials/Microbiology: Anti-infectives (From admission, onward)    Start     Dose/Rate Route Frequency Ordered Stop   03/20/24 0845  ceFAZolin  (ANCEF ) IVPB 2g/100 mL premix        over 30 Minutes Intravenous Continuous PRN 03/20/24 0859 03/20/24 0845   02/27/24 1200  vancomycin  (VANCOREADY) IVPB 500 mg/100 mL  Status:  Discontinued        500 mg 100 mL/hr over 60 Minutes Intravenous Every M-W-F (Hemodialysis) 02/25/24 1129 02/25/24 1436   02/26/24 2200  ceFAZolin  (ANCEF ) IVPB 1 g/50 mL premix        1 g 100 mL/hr over 30 Minutes Intravenous Every 24 hours 02/26/24 1009 03/03/24 2140   02/25/24 1530  vancomycin  (VANCOREADY) IVPB 500 mg/100 mL        500 mg 100 mL/hr over 60 Minutes Intravenous Every M-W-F (Hemodialysis) 02/25/24 1436 02/25/24 1934   02/23/24 1800  meropenem  (MERREM ) 1 g in sodium chloride  0.9 % 100 mL IVPB  Status:  Discontinued        1 g 200 mL/hr over 30 Minutes Intravenous Every 24 hours 02/23/24 1625 02/26/24 1009  02/23/24 1800  vancomycin  (VANCOREADY) IVPB 1250 mg/250 mL        1,250 mg 166.7 mL/hr over 90 Minutes Intravenous  Once 02/23/24 1625 02/23/24 1933   02/17/24 2200  piperacillin -tazobactam (ZOSYN ) IVPB 2.25 g        2.25 g 100 mL/hr over 30 Minutes Intravenous Every 8 hours  02/17/24 1412 02/21/24 2336   02/14/24 1800  vancomycin  (VANCOREADY) IVPB 2000 mg/400 mL        2,000 mg 200 mL/hr over 120 Minutes Intravenous  Once 02/14/24 1709 02/14/24 2048   02/14/24 1622  piperacillin -tazobactam (ZOSYN ) IVPB 3.375 g  Status:  Discontinued        3.375 g 100 mL/hr over 30 Minutes Intravenous Every 6 hours 02/14/24 1622 02/17/24 1412   02/14/24 1545  piperacillin -tazobactam (ZOSYN ) IVPB 3.375 g  Status:  Discontinued        3.375 g 12.5 mL/hr over 240 Minutes Intravenous Every 8 hours 02/14/24 1451 02/14/24 1453   02/14/24 1545  piperacillin -tazobactam (ZOSYN ) IVPB 2.25 g  Status:  Discontinued        2.25 g 100 mL/hr over 30 Minutes Intravenous Every 8 hours 02/14/24 1453 02/14/24 1622   02/14/24 0800  cefTRIAXone  (ROCEPHIN ) 2 g in sodium chloride  0.9 % 100 mL IVPB  Status:  Discontinued        2 g 200 mL/hr over 30 Minutes Intravenous Every 24 hours 02/14/24 0730 02/14/24 1451   02/14/24 0800  metroNIDAZOLE  (FLAGYL ) IVPB 500 mg  Status:  Discontinued        500 mg 100 mL/hr over 60 Minutes Intravenous Every 12 hours 02/14/24 0730 02/14/24 1451         Component Value Date/Time   SDES BACK 03/13/2024 1040   SPECREQUEST NONE 03/13/2024 1040   CULT  03/13/2024 1040    NO GROWTH 3 DAYS Performed at [facility], [address], [place], [name] [address]    REPTSTATUS 03/16/2024 FINAL 03/13/2024 1040    Procedures:  Medications reviewed:  Scheduled Meds:  amLODipine   10 mg Per Tube Daily   aspirin   81 mg Per Tube Daily   Chlorhexidine  Gluconate Cloth  6 each Topical Q0600   cloNIDine   0.1 mg Transdermal Weekly   cycloSPORINE   200 mg Per Tube QHS   cycloSPORINE   225 mg Per Tube Daily   darbepoetin (ARANESP ) injection - DIALYSIS  100 mcg Subcutaneous Q Fri-1800   feeding supplement  237 mL Per Tube BID BM   heparin  injection (subcutaneous)  5,000 Units Subcutaneous Q8H   insulin  aspart  0-9 Units Subcutaneous Q4H   lacosamide   100 mg Per Tube BID    metoCLOPramide  (REGLAN ) injection  5 mg Intravenous Q8H   metoprolol  tartrate  50 mg Per Tube BID   multivitamin  1 tablet Per Tube QHS   mycophenolate   500 mg Per Tube BID   predniSONE   15 mg Oral Q breakfast   sevelamer  carbonate  2.4 g Per Tube TID WC   sodium chloride  flush  3 mL Intravenous Q12H   Continuous Infusions:  feeding supplement (NEPRO CARB STEADY) 45 mL/hr at 03/25/24 1800    Mennie LAMY, MD [name] 03/26/2024, 1:08 PM

## 2024-03-26 NOTE — Progress Notes (Signed)
 [facility] Progress Note   Subjective:    Seen and examined patient at bedside. Tolerating UFG 2L. She is requesting pain medication.   Objective Vitals:   03/26/24 0930 03/26/24 1000 03/26/24 1030 03/26/24 1100  BP: (!) 184/48 (!) 162/51 (!) 162/51 (!) 152/61  Pulse: (!) 102 (!) 105 (!) 103 (!) 101  Resp: 19 13 (!) 6 (!) 9  Temp:      TempSrc:      SpO2: 93% 93% 95% 94%  Weight:      Height:       Physical Exam General adult female in bed in NAD; opens eyes and answers to voice  Lungs clear to auscultation bilaterally normal work of breathing at rest; on room air Heart S1S2 tachycardic no rub Abdomen soft nontender nondistended Extremities no edema  Access RUE AVF with bruit and thrill; aneurysmal region  [facility] Weights   03/26/24 0500 03/26/24 0825 03/26/24 0843  Weight: 52 kg 53.8 kg 53.8 kg    Intake/Output Summary (Last 24 hours) at 03/26/2024 1133 Last data filed at 03/26/2024 0744 Gross per 24 hour  Intake 1183 ml  Output 100 ml  Net 1083 ml    Additional Objective Labs: Basic Metabolic Panel: Recent Labs  Lab 03/24/24 0708 03/24/24 1139 03/25/24 0610 03/25/24 0919 03/25/24 2042 03/25/24 2343 03/26/24 0552  NA 133*  --  129*  --   --   --  130*  K 3.3*   < > 3.5   < > 4.2 4.1 4.1  CL 90*  --  88*  --   --   --  86*  CO2 27  --  30  --   --   --  31  GLUCOSE 117*  --  130*  --   --   --  112*  BUN 14  --  28*  --   --   --  41*  CREATININE 3.65*  --  5.23*  --   --   --  6.29*  CALCIUM  9.4  --  10.0  --   --   --  10.1  PHOS 3.5  --  4.3  --   --   --  4.5   < > = values in this interval not displayed.   Liver Function Tests: Recent Labs  Lab 03/24/24 0708 03/25/24 0610 03/26/24 0552  ALBUMIN 2.6* 2.4* 2.4*   No results for input(s): LIPASE, AMYLASE in the last 168 hours. CBC: Recent Labs  Lab 03/21/24 0412 03/22/24 0421 03/23/24 0540 03/24/24 0708 03/26/24 0551  WBC 5.1 4.9 3.9* 5.6 6.0  HGB 9.0* 8.9* 8.5* 9.2*  8.6*  HCT 27.8* 27.3* 26.0* 27.4* 25.6*  MCV 96.9 97.8 95.9 94.2 96.2  PLT 207 220 203 220 204   Blood Culture    Component Value Date/Time   SDES BACK 03/13/2024 1040   SPECREQUEST NONE 03/13/2024 1040   CULT  03/13/2024 1040    NO GROWTH 3 DAYS Performed at [facility], [address], [place], [name] [address]    REPTSTATUS 03/16/2024 FINAL 03/13/2024 1040    Cardiac Enzymes: No results for input(s): CKTOTAL, CKMB, CKMBINDEX, TROPONINI in the last 168 hours. CBG: Recent Labs  Lab 03/25/24 1150 03/25/24 1542 03/25/24 1944 03/25/24 2313 03/26/24 0306  GLUCAP 148* 131* 111* 117* 132*   Iron  Studies: No results for input(s): IRON , TIBC, TRANSFERRIN, FERRITIN in the last 72 hours. Lab Results  Component Value Date   INR 1.3 (H)  02/20/2024   INR 1.3 (H) 02/14/2024   INR 1.2 06/13/2023   Studies/Results: No results found.  Medications:  anticoagulant sodium citrate      feeding supplement (NEPRO CARB STEADY) 45 mL/hr at 03/25/24 1800    amLODipine   10 mg Per Tube Daily   aspirin   81 mg Per Tube Daily   Chlorhexidine  Gluconate Cloth  6 each Topical Q0600   cloNIDine   0.1 mg Transdermal Weekly   cycloSPORINE   200 mg Per Tube QHS   cycloSPORINE   225 mg Per Tube Daily   darbepoetin (ARANESP ) injection - DIALYSIS  100 mcg Subcutaneous Q Fri-1800   feeding supplement  237 mL Per Tube BID BM   heparin  injection (subcutaneous)  5,000 Units Subcutaneous Q8H   insulin  aspart  0-9 Units Subcutaneous Q4H   lacosamide   100 mg Per Tube BID   metoCLOPramide  (REGLAN ) injection  5 mg Intravenous Q8H   metoprolol  tartrate  50 mg Per Tube BID   multivitamin  1 tablet Per Tube QHS   mycophenolate   500 mg Per Tube BID   predniSONE   15 mg Oral Q breakfast   sevelamer  carbonate  2.4 g Per Tube TID WC   sodium chloride  flush  3 mL Intravenous Q12H    Dialysis Orders: MWF AF 3h  B400   57.2kg  2K   AVF  Heparin  none - Hectoral 4mcg IV q HD - Mircera  150mcg IV q 2 weeks (last given 6/9 -> due 6/23)  Assessment/Plan: Intracerebral hemorrhage: C/b hydrocephalus, 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 TCDs with evidence of vasospasm 02/11/24. Followed closely by neurosurgery, evd removed 6/30 CVA: New CVA found on 7/1 ESRD:  HD per MWF schedule usually. Refused HD on 7/19 but received treatment early morning of 7/21 (1.5L removed). Continue MWF per her routine schedule. On HD. S/p CRRT 6/12-6/15, then back on iHD.     Hyperkalemia: resolved w/ TF changed to Nepro Volume: no vol excess on exam. Note she is below her EDW.  HTN:  optimize volume with HD.  Started back amlodipine  at 5 mg daily on 7/15 and increased to 10 mg nightly on 7/17 (evening dosing preferred due to variable HD schedule).  Note that her amlodipine  was not given on 7/17 or 7/18 as no enteric access. Anemia of ESRD: increased aranesp  to 100 mcg every Friday. Iron  adequate Secondary HPTH: Ca and Phos high, VDRA stopped 6/21, added sevelamer  via tube. 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 CyA level 7/09 was 109 (goal 100-200) which is in range.   Team is getting enteric access again - transplant meds would be essential.  She was temporarily converted to IV meds for immunosuppression   Dispo - per primary team.  Team states that plan likely for LTAC once more permanent enteric access obtained   [name], NP [facility] 03/26/2024,11:33 AM  LOS: 41 days

## 2024-03-26 NOTE — Progress Notes (Addendum)
 Occupational Therapy Treatment Patient Details Name: [name] [name] MRN: [id number] DOB: 1980-09-07 Today's Date: 03/26/2024   History of present illness 43 yo female who presented to the ED 6/11 with n/v, abd pain after HD. 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 CRRT 6/13- 6/15. Cerebral angiogram 6/25 shows no intracranial aneurysms, AVM, or fistula seen to explain the patient's subarachnoid hemorrhage, diffuse mild-moderate anterior circulation vasospasm. EVD 6/12-6/30.  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 LP 7/10- negative. 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   OT comments  Pt making slow progress toward goals, seen post HD with pt agreeable to therapy. Pt able to come to bed with minA, sits EOB x10 min for seated BLE therex, at times needing up to mod A for sitting balance. Pt becoming hot/sweaty sitting EOB, returned to supine with BP elevated 192/95 (125), RN notified. Pt min A overall for bathing/dressing tasks at bed level. Pt needs cues to initiate and follow through basic tasks. Pt presenting with impairments listed below, will follow acutely. Patient will benefit from continued inpatient follow up therapy, <3 hours/day to maximize safety/ind with ADL/functional mobility.       If plan is discharge home, recommend the following:  A lot of help with walking and/or transfers;Two people to help with walking and/or transfers;A lot of help with bathing/dressing/bathroom;Two people to help with bathing/dressing/bathroom;Assistance with cooking/housework;Assistance with feeding;Direct supervision/assist for medications management;Direct supervision/assist for financial management;Assist for transportation;Supervision due to cognitive status;Help with stairs or ramp for entrance   Equipment Recommendations  Other  (comment) (defer)    Recommendations for Other Services PT consult    Precautions / Restrictions Precautions Precautions: Fall Recall of Precautions/Restrictions: Impaired Precaution/Restrictions Comments: cortrak, PEG, flexiseal Restrictions Weight Bearing Restrictions Per Provider Order: No       Mobility Bed Mobility Overal bed mobility: Needs Assistance Bed Mobility: Rolling, Sidelying to Sit Rolling: Min assist Sidelying to sit: Min assist, Used rails, HOB elevated            Transfers                   General transfer comment: pt declines OOB mobility/standing attempts this date     Balance Overall balance assessment: Needs assistance Sitting-balance support: Feet supported, Bilateral upper extremity supported Sitting balance-Leahy Scale: Fair Sitting balance - Comments: min-mod A for EOB sitting balance                                   ADL either performed or assessed with clinical judgement   ADL Overall ADL's : Needs assistance/impaired     Grooming: Set up;Bed level   Upper Body Bathing: Minimal assistance;Bed level   Lower Body Bathing: Minimal assistance;Bed level   Upper Body Dressing : Minimal assistance;Bed level   Lower Body Dressing: Contact guard assist;Bed level                      Extremity/Trunk Assessment Upper Extremity Assessment Upper Extremity Assessment: Generalized weakness   Lower Extremity Assessment Lower Extremity Assessment: Defer to PT evaluation        Vision   Vision Assessment?: Vision impaired- to be further tested in functional context Tracking/Visual Pursuits: Decreased smoothness of horizontal tracking;Decreased smoothness of vertical tracking Saccades: Decreased speed of saccadic movement;Additional  eye shifts occurred during testing;Impaired - to be further tested in functional context Convergence: Impaired (comment) Visual Fields: Impaired-to be further tested in  functional context Additional Comments: reports intermittent? double vision, at times states she sees 2 fingers and other times she states 4 when only 2 fingers are being held up in front of her   Perception     Praxis Praxis Praxis: Impaired Praxis Impairment Details: Organization   Communication Communication Communication: Impaired Factors Affecting Communication: Reduced clarity of speech   Cognition Arousal: Lethargic Behavior During Therapy: Flat affect Cognition: Cognition impaired   Orientation impairments: Time, Situation (states it is August despite being told it is July) Awareness: Intellectual awareness impaired, Online awareness impaired Memory impairment (select all impairments): Short-term memory Attention impairment (select first level of impairment): Sustained attention Executive functioning impairment (select all impairments): Initiation OT - Cognition Comments: perseverative on pain medication                 Following commands: Impaired Following commands impaired: Only follows one step commands consistently      Cueing   Cueing Techniques: Verbal cues, Gestural cues  Exercises Other Exercises Other Exercises: seated leg kicks x10    Shoulder Instructions       General Comments see note for BP measures    Pertinent Vitals/ Pain       Pain Assessment Pain Assessment: Faces Pain Location: abdomen at G - tube site Pain Descriptors / Indicators: Discomfort Pain Intervention(s): Limited activity within patient's tolerance, Monitored during session, Repositioned  Home Living                                          Prior Functioning/Environment              Frequency  Min 2X/week        Progress Toward Goals  OT Goals(current goals can now be found in the care plan section)  Progress towards OT goals: Progressing toward goals (self limiting at times, perseverative on pain meds)  Acute Rehab OT Goals Patient  Stated Goal: none stated OT Goal Formulation: With patient Time For Goal Achievement: 04/01/24 Potential to Achieve Goals: Fair ADL Goals Pt Will Perform Grooming: with supervision;sitting Pt Will Perform Upper Body Dressing: with min assist;sitting Pt Will Perform Lower Body Dressing: with min assist;sitting/lateral leans;sit to/from stand Pt Will Transfer to Toilet: with min assist;stand pivot transfer;bedside commode Additional ADL Goal #1: pt will follow 1 step commands 50% of the time to complete a functional task  Plan      Co-evaluation                 AM-PAC OT 6 Clicks Daily Activity     Outcome Measure   Help from another person eating meals?: A Lot Help from another person taking care of personal grooming?: A Little Help from another person toileting, which includes using toliet, bedpan, or urinal?: A Lot Help from another person bathing (including washing, rinsing, drying)?: A Little Help from another person to put on and taking off regular upper body clothing?: A Little Help from another person to put on and taking off regular lower body clothing?: A Little 6 Click Score: 16    End of Session    OT Visit Diagnosis: Unsteadiness on feet (R26.81);Other abnormalities of gait and mobility (R26.89);Muscle weakness (generalized) (M62.81);Hemiplegia and hemiparesis Hemiplegia - Right/Left: Right  Activity Tolerance Patient limited by pain   Patient Left in bed;with call bell/phone within reach;with bed alarm set;with nursing/sitter in room   Nurse Communication Mobility status (elevated BP)        Time: 8465-8392 OT Time Calculation (min): 33 min  Charges: OT General Charges $OT Visit: 1 Visit OT Treatments $Self Care/Home Management : 8-22 mins $Therapeutic Exercise: 8-22 mins  [employer] [place] K, OTD, OTR/L SecureChat Preferred Acute Rehab [phone]   [name] [name] 03/26/2024, 4:34 PM

## 2024-03-26 NOTE — Progress Notes (Signed)
   03/26/24 1150  Vitals  Temp 98.1 F (36.7 C)  Pulse Rate (!) 106  Resp (!) 6  BP (!) 162/48  SpO2 95 %  O2 Device Room Air  Weight 51.8 kg  Oxygen  Therapy  Patient Activity (if Appropriate) In bed  Pulse Oximetry Type Continuous  Oximetry Probe Site Changed No  Post Treatment  Dialyzer Clearance Lightly streaked  Liters Processed 54  Fluid Removed (mL) 2000 mL  Tolerated HD Treatment Yes  AVG/AVF Arterial Site Held (minutes) 6 minutes  AVG/AVF Venous Site Held (minutes) 6 minutes   Pt tolerated tx well, uf goal met, no problems noted during tx, hemostasisx2, alert and stable.

## 2024-03-26 NOTE — Plan of Care (Signed)
  Problem: Clinical Measurements: Goal: Ability to maintain clinical measurements within normal limits will improve Outcome: Progressing Goal: Will remain free from infection Outcome: Progressing Goal: Diagnostic test results will improve Outcome: Progressing Goal: Respiratory complications will improve Outcome: Progressing   Problem: Activity: Goal: Risk for activity intolerance will decrease Outcome: Progressing   Problem: Nutrition: Goal: Adequate nutrition will be maintained Outcome: Progressing   Problem: Coping: Goal: Level of anxiety will decrease Outcome: Progressing   Problem: Elimination: Goal: Will not experience complications related to bowel motility Outcome: Progressing   Problem: Pain Managment: Goal: General experience of comfort will improve and/or be controlled Outcome: Progressing   Problem: Safety: Goal: Ability to remain free from injury will improve Outcome: Progressing   Problem: Skin Integrity: Goal: Risk for impaired skin integrity will decrease Outcome: Progressing   Problem: Coping: Goal: Ability to adjust to condition or change in health will improve Outcome: Progressing   Problem: Metabolic: Goal: Ability to maintain appropriate glucose levels will improve Outcome: Progressing   Problem: Nutritional: Goal: Maintenance of adequate nutrition will improve Outcome: Progressing Goal: Progress toward achieving an optimal weight will improve Outcome: Progressing   Problem: Skin Integrity: Goal: Risk for impaired skin integrity will decrease Outcome: Progressing   Problem: Education: Goal: Knowledge of secondary prevention will improve (MUST DOCUMENT ALL) Outcome: Progressing Goal: Knowledge of patient specific risk factors will improve (DELETE if not current risk factor) Outcome: Progressing   Problem: Intracerebral Hemorrhage Tissue Perfusion: Goal: Complications of Intracerebral Hemorrhage will be minimized Outcome: Progressing    Problem: Health Behavior/Discharge Planning: Goal: Goals will be collaboratively established with patient/family Outcome: Progressing   Problem: Self-Care: Goal: Ability to communicate needs accurately will improve Outcome: Progressing   Problem: Nutrition: Goal: Risk of aspiration will decrease Outcome: Progressing Goal: Dietary intake will improve Outcome: Progressing   Problem: Education: Goal: Knowledge of disease and its progression will improve Outcome: Progressing Goal: Individualized Educational Video(s) Outcome: Progressing   Problem: Fluid Volume: Goal: Compliance with measures to maintain balanced fluid volume will improve Outcome: Progressing   Problem: Health Behavior/Discharge Planning: Goal: Ability to manage health-related needs will improve Outcome: Progressing   Problem: Nutritional: Goal: Ability to make healthy dietary choices will improve Outcome: Progressing   Problem: Clinical Measurements: Goal: Complications related to the disease process, condition or treatment will be avoided or minimized Outcome: Progressing

## 2024-03-26 NOTE — Plan of Care (Signed)
 Problem: Clinical Measurements: Goal: Ability to maintain clinical measurements within normal limits will improve 03/26/2024 0022 by Marvis Kenneth SAILOR, RN Outcome: Progressing 03/25/2024 2058 by Marvis Kenneth SAILOR, RN Outcome: Progressing Goal: Will remain free from infection 03/26/2024 0022 by Marvis Kenneth SAILOR, RN Outcome: Progressing 03/25/2024 2058 by Marvis Kenneth SAILOR, RN Outcome: Progressing Goal: Diagnostic test results will improve 03/26/2024 0022 by Marvis Kenneth SAILOR, RN Outcome: Progressing 03/25/2024 2058 by Marvis Kenneth SAILOR, RN Outcome: Progressing Goal: Respiratory complications will improve 03/26/2024 0022 by Marvis Kenneth SAILOR, RN Outcome: Progressing 03/25/2024 2058 by Marvis Kenneth SAILOR, RN Outcome: Progressing   Problem: Activity: Goal: Risk for activity intolerance will decrease 03/26/2024 0022 by Marvis Kenneth SAILOR, RN Outcome: Progressing 03/25/2024 2058 by Marvis Kenneth SAILOR, RN Outcome: Progressing   Problem: Nutrition: Goal: Adequate nutrition will be maintained 03/26/2024 0022 by Marvis Kenneth SAILOR, RN Outcome: Progressing 03/25/2024 2058 by Marvis Kenneth SAILOR, RN Outcome: Progressing   Problem: Coping: Goal: Level of anxiety will decrease 03/26/2024 0022 by Marvis Kenneth SAILOR, RN Outcome: Progressing 03/25/2024 2058 by Marvis Kenneth SAILOR, RN Outcome: Progressing   Problem: Elimination: Goal: Will not experience complications related to bowel motility 03/26/2024 0022 by Marvis Kenneth SAILOR, RN Outcome: Progressing 03/25/2024 2058 by Marvis Kenneth SAILOR, RN Outcome: Progressing   Problem: Pain Managment: Goal: General experience of comfort will improve and/or be controlled 03/26/2024 0022 by Marvis Kenneth SAILOR, RN Outcome: Progressing 03/25/2024 2058 by Marvis Kenneth SAILOR, RN Outcome: Progressing   Problem: Safety: Goal: Ability to remain free from injury will improve 03/26/2024 0022 by Marvis Kenneth SAILOR, RN Outcome: Progressing 03/25/2024 2058  by Marvis Kenneth SAILOR, RN Outcome: Progressing   Problem: Skin Integrity: Goal: Risk for impaired skin integrity will decrease 03/26/2024 0022 by Marvis Kenneth SAILOR, RN Outcome: Progressing 03/25/2024 2058 by Marvis Kenneth SAILOR, RN Outcome: Progressing   Problem: Coping: Goal: Ability to adjust to condition or change in health will improve 03/26/2024 0022 by Marvis Kenneth SAILOR, RN Outcome: Progressing 03/25/2024 2058 by Marvis Kenneth SAILOR, RN Outcome: Progressing   Problem: Metabolic: Goal: Ability to maintain appropriate glucose levels will improve 03/26/2024 0022 by Marvis Kenneth SAILOR, RN Outcome: Progressing 03/25/2024 2058 by Marvis Kenneth SAILOR, RN Outcome: Progressing   Problem: Nutritional: Goal: Maintenance of adequate nutrition will improve 03/26/2024 0022 by Marvis Kenneth SAILOR, RN Outcome: Progressing 03/25/2024 2058 by Marvis Kenneth SAILOR, RN Outcome: Progressing Goal: Progress toward achieving an optimal weight will improve 03/26/2024 0022 by Marvis Kenneth SAILOR, RN Outcome: Progressing 03/25/2024 2058 by Marvis Kenneth SAILOR, RN Outcome: Progressing   Problem: Skin Integrity: Goal: Risk for impaired skin integrity will decrease 03/26/2024 0022 by Marvis Kenneth SAILOR, RN Outcome: Progressing 03/25/2024 2058 by Marvis Kenneth SAILOR, RN Outcome: Progressing   Problem: Education: Goal: Knowledge of secondary prevention will improve (MUST DOCUMENT ALL) 03/26/2024 0022 by Marvis Kenneth SAILOR, RN Outcome: Progressing 03/25/2024 2058 by Marvis Kenneth SAILOR, RN Outcome: Progressing Goal: Knowledge of patient specific risk factors will improve (DELETE if not current risk factor) 03/26/2024 0022 by Marvis Kenneth SAILOR, RN Outcome: Progressing 03/25/2024 2058 by Marvis Kenneth SAILOR, RN Outcome: Progressing   Problem: Intracerebral Hemorrhage Tissue Perfusion: Goal: Complications of Intracerebral Hemorrhage will be minimized 03/26/2024 0022 by Marvis Kenneth SAILOR, RN Outcome:  Progressing 03/25/2024 2058 by Marvis Kenneth SAILOR, RN Outcome: Progressing   Problem: Health Behavior/Discharge Planning: Goal: Goals will be collaboratively established with patient/family 03/26/2024 0022 by Marvis Kenneth SAILOR, RN Outcome: Progressing 03/25/2024 2058 by Marvis Kenneth SAILOR, RN Outcome: Progressing  Problem: Self-Care: Goal: Ability to communicate needs accurately will improve 03/26/2024 0022 by Marvis Kenneth SAILOR, RN Outcome: Progressing 03/25/2024 2058 by Marvis Kenneth SAILOR, RN Outcome: Progressing   Problem: Nutrition: Goal: Risk of aspiration will decrease 03/26/2024 0022 by Marvis Kenneth SAILOR, RN Outcome: Progressing 03/25/2024 2058 by Marvis Kenneth SAILOR, RN Outcome: Progressing Goal: Dietary intake will improve 03/26/2024 0022 by Marvis Kenneth SAILOR, RN Outcome: Progressing 03/25/2024 2058 by Marvis Kenneth SAILOR, RN Outcome: Progressing

## 2024-03-27 ENCOUNTER — Inpatient Hospital Stay (HOSPITAL_COMMUNITY)

## 2024-03-27 DIAGNOSIS — I609 Nontraumatic subarachnoid hemorrhage, unspecified: Secondary | ICD-10-CM | POA: Diagnosis not present

## 2024-03-27 LAB — GLUCOSE, CAPILLARY
Glucose-Capillary: 120 mg/dL — ABNORMAL HIGH (ref 70–99)
Glucose-Capillary: 118 mg/dL — ABNORMAL HIGH (ref 70–99)
Glucose-Capillary: 136 mg/dL — ABNORMAL HIGH (ref 70–99)
Glucose-Capillary: 135 mg/dL — ABNORMAL HIGH (ref 70–99)
Glucose-Capillary: 110 mg/dL — ABNORMAL HIGH (ref 70–99)

## 2024-03-27 LAB — CBC
HCT: 25.6 % — ABNORMAL LOW (ref 36.0–46.0)
Hemoglobin: 8.5 g/dL — ABNORMAL LOW (ref 12.0–15.0)
MCH: 32.6 pg (ref 26.0–34.0)
MCHC: 33.2 g/dL (ref 30.0–36.0)
MCV: 98.1 fL (ref 80.0–100.0)
Platelets: 195 10*3/uL (ref 150–400)
RBC: 2.61 MIL/uL — ABNORMAL LOW (ref 3.87–5.11)
RDW: 15.3 % (ref 11.5–15.5)
WBC: 6.1 10*3/uL (ref 4.0–10.5)
nRBC: 0 % (ref 0.0–0.2)

## 2024-03-27 LAB — POTASSIUM
Potassium: 4.2 mmol/L (ref 3.5–5.1)
Potassium: 4.2 mmol/L (ref 3.5–5.1)

## 2024-03-27 MED ORDER — HYDROMORPHONE HCL 1 MG/ML IJ SOLN
0.5000 mg | INTRAMUSCULAR | Status: DC | PRN
  Administered 2024-03-27 – 2024-03-28 (×6): 0.5 mg via INTRAVENOUS
  Filled 2024-03-27 (×5): qty 0.5

## 2024-03-27 MED ORDER — SODIUM CHLORIDE 0.9 % IV SOLN
100.0000 mg | Freq: Two times a day (BID) | INTRAVENOUS | Status: DC
  Administered 2024-03-27 – 2024-03-29 (×5): 100 mg via INTRAVENOUS
  Filled 2024-03-27 (×7): qty 10

## 2024-03-27 MED ORDER — CHLORHEXIDINE GLUCONATE CLOTH 2 % EX PADS
6.0000 | MEDICATED_PAD | Freq: Every day | CUTANEOUS | Status: DC
  Administered 2024-03-28 – 2024-03-31 (×4): 6 via TOPICAL

## 2024-03-27 MED ORDER — OXYCODONE HCL 5 MG/5ML PO SOLN
2.5000 mg | ORAL | Status: DC | PRN
  Administered 2024-03-27 – 2024-03-31 (×8): 2.5 mg via ORAL
  Filled 2024-03-27 (×8): qty 5

## 2024-03-27 MED ORDER — METOCLOPRAMIDE HCL 5 MG/5ML PO SOLN
5.0000 mg | Freq: Three times a day (TID) | ORAL | Status: DC
  Filled 2024-03-27: qty 10

## 2024-03-27 MED ORDER — METOCLOPRAMIDE HCL 5 MG/ML IJ SOLN
5.0000 mg | Freq: Three times a day (TID) | INTRAMUSCULAR | Status: DC
  Administered 2024-03-27 – 2024-03-30 (×9): 5 mg via INTRAVENOUS
  Filled 2024-03-27 (×9): qty 2

## 2024-03-27 NOTE — Progress Notes (Addendum)
 Nutrition Follow-up  DOCUMENTATION CODES:   Non-severe (moderate) malnutrition in context of chronic illness  INTERVENTION:   When post pyloric enteral access is obtained:  Nepro at 50 ml/hr (1200 ml per day)    Provides 2,124 kcal (100% of calorie needs), 97 gm protein (100% protein needs) , 872 ml free water  daily   Encourage PO intake if diet advanced House trays for now  Continue extra servings of soup on meal trays to increase intake  Discontinue Ensure as pt refusing all ONS  Rena-Vite daily per tube  If pt cannot tolerate a FLD, recommend converting PEG tube into G-J tube (G-tube for venting and J-tube for feeds) after 4 week period set by radiology (04/17/2024)  *DO NOT USE PEG TUBE AS PT THROWS UP ANYTHING GASTRIC  NUTRITION DIAGNOSIS:   Moderate Malnutrition related to chronic illness (ESRD on HD/DM) as evidenced by mild fat depletion, mild muscle depletion. 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  6/11 - ED for abx for colitis 6/12 - admit for ICH; intubated with EVD; started CRRT 6/13 - s/p cortrak placement; tip gastric 6/14 - extubated  6/15 - CRRT stopped 6/16 - began iHD treatment regimen MWF 6/27- SLP therapy, allowing floor snacks of thin liquids/purees but remained NPO 6/28- recorded emesis episode, tube feeds stopped 6/30 -EVD removed. HD overnight  7/1 - s/p post pyloric cortrak placement, New CVA  7/2 - Trickle tube feeds resumed, HD AM  7/3 - Switched to VITAL 1.5, titrate tube feeds to goal  7/9 - Swicthed to [facility] d/t elevated k, cortrak coiled in stomach, emesis, pt  pulled cortrak feeding tube out, FLD 7/10 - Consult for IR to replace post pyloric feeding tube, calorie count started, s/p lumbar puncture 7/11 - Post pyloric cortrak replaced, night RN pulled tube out because it was clogged 7/12 - 48 hour calorie count, meeting <20% of estimated energy needs 7/17 - Pt went down to radiology for G-J however PEG tube was placed, NGT pulled, pt has no safe enteral access, NPO 7/18 - Fluoroscopy replaced post pyloric NGT, tube feeds resumed 7/24 - Multiple episodes of emesis, x-ray revealed tube is now in stomach, tube feeds held   Pt went down to radiology  on 7/17 for G-J however PEG tube was placed.    Pt resting in bed, mother came [place] from out of [place] last night and was at bedside, father on phone. Mother is a professor and father is a Development worker, community in a different country.  RD discussed nutritional plan and answered any questions.   Pt still with ongoing nausea, vomiting, and pain. Had small amount of emesis during visit, was just given meds. Pt continues to only eat chicken soup and is not interested in anything else. Refuses supplements.   Later in the afternoon pt had multiple large amounts of emesis. Per RN it was mostly clear with slight color of tube feeds. RD orders abd x-ray to make sure NGT was still post pyloric. X-ray showed tip of NGT was in the distal stomach, no longer post pyloric. RD reached out to RN  to hold tube feeds until tube can be advanced by fluro. RD called fluro, tube can be advanced in the morning. Discussed with family and pt about plan, they are agreeable. Made MD aware.   Pt below EDW of 57.2 kg, most likely due to her NGT frequently becoming dislodged or having to be put on hold. Will increase rate of tube feed to compensate and monitor weight trends.   Pt refused HD on 7/19, had HD AM of 7/21. Resuming regular scehulde. 2 L removed yesterday.  Disposition: Plan for [facility]  Admit weight: 59.3 kg Current weight: 52.6 kg  EDW:  57.2 kg   Average Meal Intake: 7/11: 50% soup x 3 meals  7/17: 0% x 2 meals 7/21: 0% x 2 meals 7/22: 50% x 3 meals 7/23: 60% x 1 meal   Intake/Output Summary (Last 24 hours) at 03/27/2024 0810 Last data filed at 03/26/2024 1728 Gross per 24 hour  Intake 726 ml  Output 2000 ml  Net -1274 ml   Drains/Lines: FMS removed  No UOP PEG  Nutritionally Relevant Medications: Scheduled Meds:  amLODipine   10 mg Per Tube Daily   aspirin   81 mg Per Tube Daily   Chlorhexidine  Gluconate Cloth  6 each Topical Q0600   cloNIDine   0.1 mg Transdermal Weekly   cycloSPORINE   200 mg Per Tube QHS   cycloSPORINE   225 mg Per Tube Daily   darbepoetin (ARANESP ) injection - DIALYSIS  100 mcg Subcutaneous Q Fri-1800   feeding supplement  237 mL Per Tube BID BM   heparin  injection (subcutaneous)  5,000 Units Subcutaneous Q8H   insulin  aspart  0-9 Units Subcutaneous Q4H   lacosamide   100 mg Per Tube BID   metoCLOPramide  (REGLAN ) injection  5 mg Intravenous Q8H   metoprolol  tartrate  50 mg Per Tube BID   multivitamin  1 tablet Per Tube QHS   mycophenolate   500 mg Per Tube BID   predniSONE   15 mg Oral Q breakfast   sevelamer  carbonate  2.4 g Per Tube TID WC   sodium chloride  flush  3 mL Intravenous Q12H   Continuous Infusions:  feeding supplement (NEPRO CARB STEADY) 1,000 mL (03/26/24 2347)   Labs Reviewed: No recent  labs  CBG ranges from 115-145 mg/dL over the last 24 hours HgbA1c 4.2  Diet Order:   Diet Order             Diet full liquid Room service appropriate? Yes; Fluid consistency: Thin  Diet effective now                   EDUCATION NEEDS:   No education needs have been identified at this time  Skin:  Skin Assessment: Reviewed RN Assessment  Last BM:  7/22 100 ML via FMS  Height:   Ht Readings from Last 1 Encounters:  02/14/24 5' 7.5 (1.715 m)    Weight:   Wt Readings from Last 1 Encounters:  03/27/24 52.6 kg    Ideal Body Weight:  61.4 kg  BMI:  Body mass  index is 17.89 kg/m.  Estimated Nutritional Needs:   Kcal:  1800-2000  Protein:  75-90 grams  Fluid:  1L + UOP   [name] [name], RD Registered Dietitian  See Amion for more information

## 2024-03-27 NOTE — Progress Notes (Signed)
 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 CT scan suggested colitis.  Patient's mentation continued to worsen. CT head was subsequently done which revealed intracranial hemorrhage.  Patient was admitted to the ICU for hypertensive ICH, cva, initially in ICU intubated. Palliative following for goals of care  G tube placed by IR but needed GJ, now placed postpyloric cortrack.  Pursuing LTACH/select.   Significant Hospital Events:  6/11 ED w n/v AMS. Abx IVF for colitis 6/12 Admit. AMS decr LOC. Incr O2 req. PCCM consult and code stroke called seen w rapid response nephro NP then neuro  6/13 Intubated with EVD, responding to commands  6/14 Extubated 6/17 acute L cerebellar infarct. Keppra  dc  6/18 Dialysis, fevering, EVD at 10 (129 mL last 24 hrs) 6/19 Narcotic dependence a driving factor. +Vasospasm on TCD. CTA with evolving Left cerebellar infarct, no HT or mass effect, substantially decreased caliber of multiple intracranial arteries and bilateral Circle-of[place] branches (especially: Distal left V4, supraclinoid ICAs, MCA and ACA origins), appearance c/w widespread Vasospasm, Vasoconstriction. No associated large vessel occlusion. 6/21 CSF sampline from EVD -- WBC 244 RBC 19000, cx ngtd  6/25: diagnostic angio today with nsgy - negative 6/26 evd leaking when clamped  6/30 evd removed, HD overnight, HTN improved, 7 days cefazolin  complete 7/1 more lethargic> minimal response to narcan  x2, oxy stopped, EEG neg, MRI - new small left parietal cva, cortrak placed postpyloric, vagal episodes with N/V 7/2 iHD, vimpat  started 7/9 she appears agitated.  IVs were removed.  LP to be  initiated to evaluate her opening pressure and to ensure that she does not have meningitis if this is still pending to be done. 7/10 LP done at bedside by Neurosurgery. Cortrak was removed yesterday evening and replaced by Fluoro team today.  7/11 Cortrak obstructed and reomved and had to be replaced by Flouro. Calorie Count initiated. Undergoing Dialysis today  7/12: Having chest discomfort. Palliative Care Consulted for GOC Discussion 7/14: Agreeable to J-tube with venting G-tube. IR consulted. SLP continues to recommend Thin (Full) Liquids   Subjective: Patient seen and examined Mother at bedside  Resting comfortably no complaint Overnight afebrile BP 150s-170s, on room air Potassium 4.2 this morning CBC with chronic anemia  Assessment and plan:  Hypertensive SAH, ICH with brain compression, communicating hydrocephalus: Extensive hospitalization and consultation by neurology and neurosurgery s/p right frontal pressure ventriculostomy via burr hole and EVD was removed on 6/30. MRI brain-small foci of hemorrhage in the posterior left cerebellum without evidence for acute infarct. Punctate focus of acute infarct involving the medial left occipital cortex, evolving subacute infarct in the left parietal lobe, trace blood products. Due to concern for hydrocephalus, neurosurgery did LP, spinal fluid pressures were low and ruled out meningitis. Per Dr. Mavis, she would not benefit from a VP shunt  Palliative medicine following for GOC Patient needs LTACH>she cannot go to SNF with the post pyloric NGT, will need GJ conversion in few weeks, can be done in LTACH),will need in depth PT/OT due to lengthy stay  and will need ongoing HD. Peer to peer review declined by insurance MD -despite recommending she would highly benefit with LTAC level of care with extensive rehabilitation and ongoing nutritional augmentation tube  feeding ongoing daily HD needs. Insurance MD denied LTACH stating patient has a  chronic gastroparesis and no ongoing daily bedside nursing MD needs.   Oropharyngeal Dysphagia History of severe gastroparesis with nausea and vomiting Moderate malnutrition Deconditioning and generalized pain: G-tube placed by IR but needed GJ (after tract heals in about 4 weeks for jejunal feeds and gastric venting), now on postpyloric feeding w/ cortrak and FLD,SLP following. On Reglan  5 mg IV every 8 hours prn zofran >will change to PO reglan . and on  pain management with IV Dilaudid  every 2 hours as needed, I will try to decrease frequency add p.o. low dose.   Concern for seizure activity post ICH: Seen by neurology completed EEG continue Vimpat , seizure precaution.     Vasospasm with left cerebellar infarct, on 03/04/24:  Seen by Neurology.LDL 63.HbA1c 4.2. Will benefit in the LTAC/select facility given hemodialysis and ongoing significant medical issues.   ESRD on hemodialysis, MWF Hyperkalemia: Last HD 7/23, potassium improved.Continue chronic meds w/:cellCept ,cyclosporine , and Prednisone     Essential Hypertension: BP on higher side, midodrine  has been discontinued.Continue clonidine  patch, Norvasc  10 mg daily, Lopressor  50 mg twice daily    Chest Pain: Atypical, 2D echo showed EF normal 60 to 65%, with no regional WMA, mild LVH.Continue pain control as needed   Bradycardia:  Secondary to vagal response. Resolved.   Abdominal Discomfort and Chest Discomfort:  Continue pain control as needed     Colitis: Stable.FMS in place- will try to remove.    Status post Pancreatic Transplant/Diabetes mellitus Continue sliding scale insulin  Recent Labs  Lab 03/26/24 1600 03/26/24 2021 03/26/24 2312 03/27/24 0339 03/27/24 0827  GLUCAP 115* 119* 140* 120* 118*     Anemia of Chronic Disease H&H stable..   DVT prophylaxis: heparin  injection 5,000 Units Start: 03/14/24 0600 SCDs Start: 02/14/24 0725 Code Status:   Code Status: Full Code Family Communication: plan of care discussed  with patient at bedside. Patient status is: Remains hospitalized because of severity of illness Level of care: Progressive   Dispo: The patient is from: home            Anticipated disposition: TBD- needs ltach Objective: Vitals last 24 hrs: Vitals:   03/27/24 0328 03/27/24 0500 03/27/24 0758 03/27/24 0832  BP: (!) 178/79  (!) 188/80 (!) 146/49  Pulse: 95  (!) 102 (!) 101  Resp: 20  12 10   Temp: 99.2 F (37.3 C)   99.7 F (37.6 C)  TempSrc: Oral   Oral  SpO2: 93%  92% 93%  Weight:  52.6 kg    Height:        Physical Examination: General exam: alert awake older than stated age HEENT:Oral mucosa moist, Ear/Nose WNL grossly Respiratory system: Bilaterally clear BS,no use of accessory muscle Cardiovascular system: S1 & S2 +, No JVD. Gastrointestinal system: Abdomen soft, PEG+, CORTRAK+ Nervous System: Alert, awake, moving all extremities,and following commands. Extremities: rue AV GRAFT+ Skin: No rashes,no icterus. MSK: Thin and with   Data Reviewed: I have personally reviewed following labs and imaging studies ( see epic result tab) CBC: Recent Labs  Lab 03/22/24 0421 03/23/24 0540 03/24/24 0708 03/26/24 0551 03/27/24 0544  WBC 4.9 3.9* 5.6 6.0 6.1  HGB 8.9* 8.5* 9.2* 8.6* 8.5*  HCT 27.3* 26.0* 27.4* 25.6* 25.6*  MCV 97.8 95.9 94.2 96.2 98.1  PLT 220 203 220 204 195   CMP: Recent Labs  Lab 03/22/24 0421 03/22/24 2013 03/23/24 0026 03/23/24 0533 03/23/24 1017 03/24/24 0708 03/24/24 1139 03/25/24 0610 03/25/24  9080 03/26/24 0552 03/26/24 1345 03/26/24 1712 03/26/24 2018 03/27/24 0544 03/27/24 0828  NA 134* 132*  --  130*  --  133*  --  129*  --  130*  --   --   --   --   --   K 4.7 5.3*   < > 4.6   < > 3.3*   < > 3.5   < > 4.1 4.2 4.2 4.2 4.2 4.2  CL 95* 92*  --  92*  --  90*  --  88*  --  86*  --   --   --   --   --   CO2 24 22  --  23  --  27  --  30  --  31  --   --   --   --   --   GLUCOSE 84 121*  --  112*  --  117*  --  130*  --  112*  --   --    --   --   --   BUN 37* 44*  --  45*  --  14  --  28*  --  41*  --   --   --   --   --   CREATININE 6.73* 7.67*  --  7.73*  --  3.65*  --  5.23*  --  6.29*  --   --   --   --   --   CALCIUM  9.8 9.9  --  9.8  --  9.4  --  10.0  --  10.1  --   --   --   --   --   PHOS 5.3*  --   --  6.0*  --  3.5  --  4.3  --  4.5  --   --   --   --   --    < > = values in this interval not displayed.   GFR: Estimated Creatinine Clearance: 9.7 mL/min (A) (by C-G formula based on SCr of 6.29 mg/dL (H)). Recent Labs  Lab 03/22/24 0421 03/23/24 0533 03/24/24 0708 03/25/24 0610 03/26/24 0552  ALBUMIN 2.5* 2.4* 2.6* 2.4* 2.4*   No results for input(s): LIPASE, AMYLASE in the last 168 hours. No results for input(s): AMMONIA in the last 168 hours. Coagulation Profile: No results for input(s): INR, PROTIME in the last 168 hours. Unresulted Labs (From admission, onward)     Start     Ordered   03/22/24 2052  Potassium  STAT Now then every 4 hours ,   R     Comments: Until normal twice.   Question:  Specimen collection method  Answer:  Lab=Lab collect   03/22/24 2052           Antimicrobials/Microbiology: Anti-infectives (From admission, onward)    Start     Dose/Rate Route Frequency Ordered Stop   03/20/24 0845  ceFAZolin  (ANCEF ) IVPB 2g/100 mL premix        over 30 Minutes Intravenous Continuous PRN 03/20/24 0859 03/20/24 0845   02/27/24 1200  vancomycin  (VANCOREADY) IVPB 500 mg/100 mL  Status:  Discontinued        500 mg 100 mL/hr over 60 Minutes Intravenous Every M-W-F (Hemodialysis) 02/25/24 1129 02/25/24 1436   02/26/24 2200  ceFAZolin  (ANCEF ) IVPB 1 g/50 mL premix        1 g 100 mL/hr over 30 Minutes Intravenous Every 24 hours 02/26/24 1009 03/03/24 2140   02/25/24 1530  vancomycin  (VANCOREADY) IVPB 500 mg/100 mL        500 mg 100 mL/hr over 60 Minutes Intravenous Every M-W-F (Hemodialysis) 02/25/24 1436 02/25/24 1934   02/23/24 1800  meropenem  (MERREM ) 1 g in sodium chloride  0.9 %  100 mL IVPB  Status:  Discontinued        1 g 200 mL/hr over 30 Minutes Intravenous Every 24 hours 02/23/24 1625 02/26/24 1009   02/23/24 1800  vancomycin  (VANCOREADY) IVPB 1250 mg/250 mL        1,250 mg 166.7 mL/hr over 90 Minutes Intravenous  Once 02/23/24 1625 02/23/24 1933   02/17/24 2200  piperacillin -tazobactam (ZOSYN ) IVPB 2.25 g        2.25 g 100 mL/hr over 30 Minutes Intravenous Every 8 hours 02/17/24 1412 02/21/24 2336   02/14/24 1800  vancomycin  (VANCOREADY) IVPB 2000 mg/400 mL        2,000 mg 200 mL/hr over 120 Minutes Intravenous  Once 02/14/24 1709 02/14/24 2048   02/14/24 1622  piperacillin -tazobactam (ZOSYN ) IVPB 3.375 g  Status:  Discontinued        3.375 g 100 mL/hr over 30 Minutes Intravenous Every 6 hours 02/14/24 1622 02/17/24 1412   02/14/24 1545  piperacillin -tazobactam (ZOSYN ) IVPB 3.375 g  Status:  Discontinued        3.375 g 12.5 mL/hr over 240 Minutes Intravenous Every 8 hours 02/14/24 1451 02/14/24 1453   02/14/24 1545  piperacillin -tazobactam (ZOSYN ) IVPB 2.25 g  Status:  Discontinued        2.25 g 100 mL/hr over 30 Minutes Intravenous Every 8 hours 02/14/24 1453 02/14/24 1622   02/14/24 0800  cefTRIAXone  (ROCEPHIN ) 2 g in sodium chloride  0.9 % 100 mL IVPB  Status:  Discontinued        2 g 200 mL/hr over 30 Minutes Intravenous Every 24 hours 02/14/24 0730 02/14/24 1451   02/14/24 0800  metroNIDAZOLE  (FLAGYL ) IVPB 500 mg  Status:  Discontinued        500 mg 100 mL/hr over 60 Minutes Intravenous Every 12 hours 02/14/24 0730 02/14/24 1451         Component Value Date/Time   SDES BACK 03/13/2024 1040   SPECREQUEST NONE 03/13/2024 1040   CULT  03/13/2024 1040    NO GROWTH 3 DAYS Performed at [facility], [address] [place], [name] [address]    REPTSTATUS 03/16/2024 FINAL 03/13/2024 1040    Procedures:  Medications reviewed:  Scheduled Meds:  amLODipine   10 mg Per Tube Daily   aspirin   81 mg Per Tube Daily   Chlorhexidine  Gluconate  Cloth  6 each Topical Q0600   cloNIDine   0.1 mg Transdermal Weekly   cycloSPORINE   200 mg Per Tube QHS   cycloSPORINE   225 mg Per Tube Daily   darbepoetin (ARANESP ) injection - DIALYSIS  100 mcg Subcutaneous Q Fri-1800   feeding supplement  237 mL Per Tube BID BM   heparin  injection (subcutaneous)  5,000 Units Subcutaneous Q8H   insulin  aspart  0-9 Units Subcutaneous Q4H   lacosamide   100 mg Per Tube BID   metoCLOPramide  (REGLAN ) injection  5 mg Intravenous Q8H   metoprolol  tartrate  50 mg Per Tube BID   multivitamin  1 tablet Per Tube QHS   mycophenolate   500 mg Per Tube BID   predniSONE   15 mg Oral Q breakfast   sevelamer  carbonate  2.4 g Per Tube TID WC   sodium chloride  flush  3 mL Intravenous Q12H   Continuous Infusions:  feeding supplement (NEPRO CARB STEADY) 1,000 mL (03/26/24 2347)    Mennie LAMY, MD [name] 03/27/2024, 10:02 AM

## 2024-03-27 NOTE — Progress Notes (Signed)
 PT Cancellation Note  Patient Details Name: [name] [name] MRN: [id number] DOB: [birth year]   Cancelled Treatment:    Reason Eval/Treat Not Completed: Fatigue/lethargy limiting ability to participate  Patient sleeping soundly and barely arouses when PT and mother try to awaken her. Immediately eyes close and back to sleep. Per RN she finally got rectal tube out and got pain meds and is sleeping/resting for the first time in a while. Will continue efforts.    Macario RAMAN, PT Acute Rehabilitation Services  Office [phone]   Macario SHAUNNA Soja 03/27/2024, 3:56 PM

## 2024-03-27 NOTE — Plan of Care (Signed)
  Problem: Clinical Measurements: ?Goal: Ability to maintain clinical measurements within normal limits will improve ?Outcome: Progressing ?Goal: Will remain free from infection ?Outcome: Progressing ?Goal: Diagnostic test results will improve ?Outcome: Progressing ?Goal: Respiratory complications will improve ?Outcome: Progressing ?  ?Problem: Activity: ?Goal: Risk for activity intolerance will decrease ?Outcome: Progressing ?  ?

## 2024-03-27 NOTE — Progress Notes (Signed)
 [facility] Progress Note   Subjective:    No c/o's today [name] is in the room  Objective Vitals:   03/27/24 0500 03/27/24 0758 03/27/24 0832 03/27/24 1156  BP:  (!) 188/80 (!) 146/49 (!) 147/53  Pulse:  (!) 102 (!) 101 99  Resp:  12 10 (!) 7  Temp:   99.7 F (37.6 C) 98.9 F (37.2 C)  TempSrc:   Axillary Axillary  SpO2:  92% 93% 97%  Weight: 52.6 kg     Height:       Physical Exam General adult female in bed in NAD; opens eyes and answers to voice  Lungs clear to auscultation bilaterally normal work of breathing at rest; on room air Heart S1S2 tachycardic no rub Abdomen soft nontender nondistended Extremities no edema  Access RUE AVF with bruit and thrill; aneurysmal region  Dialysis Orders: MWF AF 3h  B400   57.2kg  2K   AVF  Heparin  none - Hectoral 4mcg IV q HD - Mircera 150mcg IV q 2 weeks (last given 6/9 -> due 6/23)  Assessment/Plan: Intracerebral hemorrhage: C/b hydrocephalus, 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 TCDs with evidence of vasospasm 02/11/24. Followed closely by neurosurgery, evd removed 6/30 CVA: New CVA found on 7/1 ESRD: S/p CRRT 6/12-6/15. Back on MWF HD. HD tomorrow     Hyperkalemia: resolved w/ TF changed to Nepro Volume: no vol excess on exam. Note she is below her EDW.  HTN: is on norvasc  10 and metoprolol  50 bid, bp's are good.  Anemia of ESRD: increased aranesp  to 100 mcg every Friday. Iron  adequate Secondary HPTH: Ca and Phos high, VDRA stopped 6/21, added sevelamer  via tube. 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 CyA level 7/09 was 109 (goal 100-200) which is in range. Dispo - per primary team.  Team states that plan likely for LTAC once more permanent enteric access obtained   Rob Sisto Granillo  MD  [facility] 03/27/2024, 1:12 PM  Recent Labs  Lab 03/25/24 0610 03/25/24 0919  03/26/24 0551 03/26/24 0552 03/26/24 1345 03/27/24 0544 03/27/24 0828  HGB  --   --  8.6*  --   --  8.5*  --   ALBUMIN 2.4*  --   --  2.4*  --   --   --   CALCIUM  10.0  --   --  10.1  --   --   --   PHOS 4.3  --   --  4.5  --   --   --   CREATININE 5.23*  --   --  6.29*  --   --   --   K 3.5   < >  --  4.1   < > 4.2 4.2   < > = values in this interval not displayed.    Inpatient medications:  amLODipine   10 mg Per Tube Daily   aspirin   81 mg Per Tube Daily   Chlorhexidine  Gluconate Cloth  6 each Topical Q0600   cloNIDine   0.1 mg Transdermal Weekly   cycloSPORINE   200 mg Per Tube QHS   cycloSPORINE   225 mg Per Tube Daily   darbepoetin (ARANESP ) injection - DIALYSIS  100 mcg Subcutaneous Q Fri-1800   feeding supplement  237 mL Per Tube BID BM   heparin  injection (subcutaneous)  5,000 Units Subcutaneous Q8H   insulin  aspart  0-9 Units Subcutaneous Q4H   lacosamide   100 mg Per Tube BID   metoCLOPramide  (REGLAN ) injection  5 mg Intravenous Q8H   metoprolol  tartrate  50 mg Per Tube BID   multivitamin  1 tablet Per Tube QHS   mycophenolate   500 mg Per Tube BID   predniSONE   15 mg Oral Q breakfast   sevelamer  carbonate  2.4 g Per Tube TID WC   sodium chloride  flush  3 mL Intravenous Q12H    feeding supplement (NEPRO CARB STEADY) Stopped (03/27/24 1110)   acetaminophen  **OR** acetaminophen , albuterol , artificial tears, atropine , butalbital -acetaminophen -caffeine , Gerhardt's butt cream, hydrALAZINE , HYDROmorphone  (DILAUDID ) injection, iohexol , labetalol , loperamide  HCl, naLOXone  (NARCAN )  injection, nitroGLYCERIN , ondansetron  (ZOFRAN ) IV, mouth rinse, oxyCODONE , prochlorperazine , simethicone 

## 2024-03-27 NOTE — Progress Notes (Signed)
 SLP Cancellation Note  Patient Details Name: [name] [name] MRN: [id number] DOB: 12-27-1980   Cancelled treatment:       Reason Eval/Treat Not Completed: Patient at procedure or test/unavailable (with providers at time of two attempts). SLP will continue efforts.    Damien Blumenthal, M.A., CCC-SLP Speech Language Pathology, Acute Rehabilitation Services  Secure Chat preferred [phone]  03/27/2024, 3:11 PM

## 2024-03-28 ENCOUNTER — Inpatient Hospital Stay (HOSPITAL_COMMUNITY)

## 2024-03-28 DIAGNOSIS — I609 Nontraumatic subarachnoid hemorrhage, unspecified: Secondary | ICD-10-CM | POA: Diagnosis not present

## 2024-03-28 LAB — RENAL FUNCTION PANEL
Albumin: 2.3 g/dL — ABNORMAL LOW (ref 3.5–5.0)
Anion gap: 14 (ref 5–15)
BUN: 38 mg/dL — ABNORMAL HIGH (ref 6–20)
CO2: 28 mmol/L (ref 22–32)
Calcium: 10.6 mg/dL — ABNORMAL HIGH (ref 8.9–10.3)
Chloride: 95 mmol/L — ABNORMAL LOW (ref 98–111)
Creatinine, Ser: 5.5 mg/dL — ABNORMAL HIGH (ref 0.44–1.00)
GFR, Estimated: 9 mL/min — ABNORMAL LOW (ref >60)
Glucose, Bld: 90 mg/dL (ref 70–99)
Phosphorus: 4 mg/dL (ref 2.5–4.6)
Potassium: 4.9 mmol/L (ref 3.5–5.1)
Sodium: 137 mmol/L (ref 135–145)

## 2024-03-28 LAB — GLUCOSE, CAPILLARY
Glucose-Capillary: 105 mg/dL — ABNORMAL HIGH (ref 70–99)
Glucose-Capillary: 108 mg/dL — ABNORMAL HIGH (ref 70–99)
Glucose-Capillary: 112 mg/dL — ABNORMAL HIGH (ref 70–99)
Glucose-Capillary: 143 mg/dL — ABNORMAL HIGH (ref 70–99)
Glucose-Capillary: 155 mg/dL — ABNORMAL HIGH (ref 70–99)
Glucose-Capillary: 94 mg/dL (ref 70–99)
Glucose-Capillary: 96 mg/dL (ref 70–99)

## 2024-03-28 MED ORDER — LIDOCAINE VISCOUS HCL 2 % MT SOLN
6.0000 mL | Freq: Once | OROMUCOSAL | Status: AC
  Administered 2024-03-28: 6 mL via OROMUCOSAL
  Filled 2024-03-28: qty 10

## 2024-03-28 MED ORDER — HYDRALAZINE HCL 50 MG PO TABS
75.0000 mg | ORAL_TABLET | Freq: Three times a day (TID) | ORAL | Status: DC
  Administered 2024-03-28 – 2024-03-30 (×6): 75 mg via ORAL
  Filled 2024-03-28 (×6): qty 1

## 2024-03-28 MED ORDER — IOHEXOL 300 MG/ML  SOLN
15.0000 mL | Freq: Once | INTRAMUSCULAR | Status: AC | PRN
  Administered 2024-03-28: 15 mL

## 2024-03-28 MED ORDER — NEPRO/CARBSTEADY PO LIQD
1000.0000 mL | ORAL | Status: DC
  Administered 2024-03-28 – 2024-03-31 (×4): 1000 mL
  Filled 2024-03-28 (×4): qty 1000

## 2024-03-28 MED ORDER — CLONIDINE HCL 0.3 MG/24HR TD PTWK
0.3000 mg | MEDICATED_PATCH | TRANSDERMAL | Status: DC
  Administered 2024-03-28: 0.3 mg via TRANSDERMAL
  Filled 2024-03-28: qty 1

## 2024-03-28 MED ORDER — HYDROMORPHONE HCL 1 MG/ML IJ SOLN
INTRAMUSCULAR | Status: AC
  Filled 2024-03-28: qty 0.5

## 2024-03-28 MED ORDER — HYDROMORPHONE HCL 1 MG/ML IJ SOLN
0.5000 mg | INTRAMUSCULAR | Status: DC | PRN
  Administered 2024-03-28 – 2024-03-30 (×10): 0.5 mg via INTRAVENOUS
  Filled 2024-03-28 (×10): qty 0.5

## 2024-03-28 MED ORDER — METOPROLOL TARTRATE 5 MG/5ML IV SOLN
5.0000 mg | Freq: Four times a day (QID) | INTRAVENOUS | Status: DC
  Administered 2024-03-28 – 2024-03-29 (×3): 5 mg via INTRAVENOUS
  Filled 2024-03-28 (×4): qty 5

## 2024-03-28 MED ORDER — CLONIDINE HCL 0.3 MG/24HR TD PTWK: 0.3000 mg | MEDICATED_PATCH | TRANSDERMAL | Status: DC

## 2024-03-28 MED ORDER — STERILE WATER FOR INJECTION IJ SOLN
INTRAMUSCULAR | Status: AC
  Administered 2024-03-28: 0.25 mL
  Filled 2024-03-28: qty 10

## 2024-03-28 MED ORDER — LORAZEPAM 2 MG/ML IJ SOLN
0.5000 mg | Freq: Once | INTRAMUSCULAR | Status: AC
  Administered 2024-03-28: 0.5 mg via INTRAVENOUS
  Filled 2024-03-28: qty 1

## 2024-03-28 NOTE — Progress Notes (Signed)
 After pt received 0.5mg  of dilaudid  IV at 0932, pt immediately asked for more pain medication before nurse could leave bay. Informed pt she could not have more pain medication at that time. Pt became upset and starting shaking, pt informed she would like to stop dialysis. Notified MD-Schertz and MD-Ramesh. Pt signed AMA, another nurse confirmed if pt would like to end dialysis. MD Mennie changed pain medication to q3h instead of q4h.

## 2024-03-28 NOTE — Progress Notes (Addendum)
 Nutrition Brief Note   Fluoroscopy successful in placing post pyloric NGT this morning. Pt now has safe enteral access and can resume tube feeds. Titrate tube feeds to goal and monitor for tolerance. Made RN aware.    INTERVENTION:  Initiate tube feeds via post pyloric NGT:  Nepro at 25 ml/hr and increase by 10 ml every 6 hours until goal of 50 ml/hr (1,200 ml per day)  *If patient has any episodes of emesis, stop tube feeds    Provides 2,124 kcal (100% of calorie needs), 97 gm protein (100% protein needs) , 872 ml free water  daily    Encourage PO intake if diet advanced House trays for now  Continue extra servings of soup on meal trays to increase intake  Discontinue Ensure as pt refusing all ONS  Rena-Vite daily per tube  If pt cannot tolerate a FLD, recommend converting PEG tube into G-J tube (G-tube for venting and J-tube for feeds) after 4 week period set by radiology (04/17/2024)  *DO NOT USE PEG TUBE AS PT THROWS UP ANYTHING GASTRIC   Olivia Kenning, RD Registered Dietitian  See Amion for more information

## 2024-03-28 NOTE — Progress Notes (Signed)
 [facility] Progress Note   Subjective:    Signed off HD early today after 30 minutes   Objective Vitals:   03/28/24 0902 03/28/24 0930 03/28/24 0950 03/28/24 0951  BP: (!) 205/69 (!) 218/73 (!) 218/79 (!) 218/72  Pulse: 98 (!) 121 (!) 127 (!) 126  Resp: 10 11 12 12   Temp:    99.4 F (37.4 C)  TempSrc:      SpO2: 93% 96% 97% 96%  Weight:      Height:       Physical Exam General adult female in bed in NAD; opens eyes and answers to voice  Lungs clear to auscultation bilaterally normal work of breathing at rest; on room air Heart S1S2 tachycardic no rub Abdomen soft nontender nondistended Extremities no edema  Access RUE AVF with bruit and thrill; aneurysmal region  Dialysis Orders: MWF AF 3h  B400   57.2kg  2K   AVF  Heparin  none - Hectoral 4mcg IV q HD - Mircera 150mcg IV q 2 weeks (last given 6/9 -> due 6/23)  Assessment/Plan: Intracerebral hemorrhage: C/b hydrocephalus, 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 TCDs with evidence of vasospasm 02/11/24. Followed closely by neurosurgery, evd removed 6/30 CVA: New CVA found on 7/1 ESRD: S/p CRRT 6/12-6/15. Back on MWF HD. HD today.  Volume: no vol excess on exam. Note she is below her EDW.  HTN: bp's uncontrolled since NG out, getting IV meds prn. Have d/w pmd.  Anemia of ESRD: increased aranesp  to 100 mcg every Friday. Iron  adequate Secondary HPTH: Ca and Phos high, VDRA stopped 6/21, added sevelamer  via tube. 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 CyA level 7/09 was 109 (goal 100-200) which is in range. Dispo - per primary team.  Team states that plan likely for LTAC once more permanent enteric access obtained   Myer Fret  MD  [facility] 03/28/2024, 11:00 AM  Recent Labs  Lab 03/26/24 0551 03/26/24 0552 03/26/24 1345 03/27/24 0544 03/27/24 0828 03/28/24 0619  HGB 8.6*   --   --  8.5*  --   --   ALBUMIN  --  2.4*  --   --   --  2.3*  CALCIUM   --  10.1  --   --   --  10.6*  PHOS  --  4.5  --   --   --  4.0  CREATININE  --  6.29*  --   --   --  5.50*  K  --  4.1   < > 4.2 4.2 4.9   < > = values in this interval not displayed.    Inpatient medications:  amLODipine   10 mg Per Tube Daily   aspirin   81 mg Per Tube Daily   Chlorhexidine  Gluconate Cloth  6 each Topical Q0600   [START ON 03/30/2024] cloNIDine   0.3 mg Transdermal Weekly   cycloSPORINE   200 mg Per Tube QHS   cycloSPORINE   225 mg Per Tube Daily   darbepoetin (ARANESP ) injection - DIALYSIS  100 mcg Subcutaneous Q Fri-1800   heparin  injection (subcutaneous)  5,000 Units Subcutaneous Q8H   insulin  aspart  0-9 Units Subcutaneous Q4H   metoCLOPramide  (REGLAN ) injection  5 mg Intravenous Q8H   metoprolol  tartrate  5 mg Intravenous Q6H   metoprolol  tartrate  50 mg Per Tube BID   multivitamin  1 tablet Per Tube  QHS   mycophenolate   500 mg Per Tube BID   predniSONE   15 mg Oral Q breakfast   sevelamer  carbonate  2.4 g Per Tube TID WC   sodium chloride  flush  3 mL Intravenous Q12H    feeding supplement (NEPRO CARB STEADY)     lacosamide  (VIMPAT ) IV 100 mg (03/27/24 2153)   acetaminophen  **OR** acetaminophen , albuterol , artificial tears, atropine , butalbital -acetaminophen -caffeine , Gerhardt's butt cream, hydrALAZINE , HYDROmorphone  (DILAUDID ) injection, iohexol , labetalol , loperamide  HCl, naLOXone  (NARCAN )  injection, nitroGLYCERIN , ondansetron  (ZOFRAN ) IV, mouth rinse, oxyCODONE , prochlorperazine , simethicone 

## 2024-03-28 NOTE — Progress Notes (Signed)
 SLP Cancellation Note  Patient Details Name: Tru Leopard MRN: [id number] DOB: 14-Jul-1981   Cancelled treatment:       Reason Eval/Treat Not Completed: Patient at procedure or test/unavailable (HD). SLP will continue following.    Damien Blumenthal, M.A., CCC-SLP Speech Language Pathology, Acute Rehabilitation Services  Secure Chat preferred [phone]  03/28/2024, 9:02 AM

## 2024-03-28 NOTE — Progress Notes (Signed)
 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 CT scan suggested colitis.  Patient's mentation continued to worsen. CT head was subsequently done which revealed intracranial hemorrhage.  Patient was admitted to the ICU for hypertensive ICH, cva, initially in ICU intubated. Palliative following for goals of care  G tube placed by IR but needed GJ, now placed postpyloric cortrack.  Pursuing LTACH/select.   Significant Hospital Events:  6/11 ED w n/v AMS. Abx IVF for colitis 6/12 Admit. AMS decr LOC. Incr O2 req. PCCM consult and code stroke called seen w rapid response nephro NP then neuro  6/13 Intubated with EVD, responding to commands  6/14 Extubated 6/17 acute L cerebellar infarct. Keppra  dc  6/18 Dialysis, fevering, EVD at 10 (129 mL last 24 hrs) 6/19 Narcotic dependence a driving factor. +Vasospasm on TCD. CTA with evolving Left cerebellar infarct, no HT or mass effect, substantially decreased caliber of multiple intracranial arteries and bilateral Circle-of[place] branches (especially: Distal left V4, supraclinoid ICAs, MCA and ACA origins), appearance c/w widespread Vasospasm, Vasoconstriction. No associated large vessel occlusion. 6/21 CSF sampline from EVD -- WBC 244 RBC 19000, cx ngtd  6/25: diagnostic angio today with nsgy - negative 6/26 evd leaking when clamped  6/30 evd removed, HD overnight, HTN improved, 7 days cefazolin  complete 7/1 more lethargic> minimal response to narcan  x2, oxy stopped, EEG neg, MRI - new small left parietal cva, cortrak placed postpyloric, vagal episodes with N/V 7/2 iHD, vimpat  started 7/9 she appears agitated.  IVs were removed.  LP to be  initiated to evaluate her opening pressure and to ensure that she does not have meningitis if this is still pending to be done. 7/10 LP done at bedside by Neurosurgery. Cortrak was removed yesterday evening and replaced by Fluoro team today.  7/11 Cortrak obstructed and reomved and had to be replaced by Flouro. Calorie Count initiated. Undergoing Dialysis today  7/12: Having chest discomfort. Palliative Care Consulted for GOC Discussion 7/14: Agreeable to J-tube with venting G-tube. IR consulted. SLP continues to recommend Thin (Full) Liquids 7/24- ltach denied and appeal initiated.    Subjective: Seen and examined in dialysis Overnight afebrile BP has been running high 140s-190s, on room air  Patient started vomiting yesterday and x-ray showed cortrak has migrated into stomach and tube feed remains on hold- IR planning to replace cortrak Reports she had vomiting this morning.  Blood pressure trending up. She is asking for IV Dilaudid  before HD. She refused HD after being on it for 25 min- nephro aware  Assessment and plan:  Hypertensive SAH, ICH with brain compression, communicating hydrocephalus: Extensive hospitalization and consultation by neurology and neurosurgery s/p right frontal pressure ventriculostomy via burr hole and EVD was removed on 6/30. MRI brain-small foci of hemorrhage in the posterior left cerebellum without evidence for acute infarct. Punctate focus of acute infarct involving the medial left occipital cortex, evolving subacute infarct in the left parietal lobe, trace blood products. Due to concern for hydrocephalus, neurosurgery did LP, spinal fluid pressures were low and ruled out meningitis. Per Dr. Mavis, she would not benefit from a VP shunt  Palliative medicine following for GOC Patient needs LTACH>she cannot go to SNF with the post pyloric NGT, will need  GJ conversion in few weeks, can be done in LTACH),will need in depth PT/OT due to lengthy stay  and will need  ongoing HD. Peer to peer review declined by insurance MD 7/24 and appeal in process. LTACH despite recommending she would highly benefit with LTAC level of care with extensive rehabilitation and ongoing nutritional augmentation with medical management   Oropharyngeal Dysphagia History of severe gastroparesis with nausea and vomiting Moderate malnutrition Deconditioning and generalized pain Chronic opiate use-PTA on Oxy 5/325 3 times daily per med rec: G-tube placed by IR but needed GJ (after tract heals in about 4 weeks for jejunal feeds and gastric venting), now on postpyloric feeding w/ cortrak and FLD,SLP following>Patient started vomiting yesterday and x-ray showed cortrak has migrated into stomach and tube feed remains on hold- IR planning to replace cortrak Cont Reglan  5 mg IV q8hr prn antiemetics> will  change to PO as able Cont pain maangement with IV Dilaudid  and gradually changed to oral regimen   Concern for seizure activity post ICH: Seen by neurology completed EEG continue Vimpat , seizure precaution.     Vasospasm with left cerebellar infarct, on 03/04/24:  Seen by Neurology.LDL 63.HbA1c 4.2. Will benefit in the LTAC/select facility given hemodialysis and ongoing significant medical issues.   ESRD on hemodialysis, MWF Hyperkalemia: Last HD 7/23, potassium improved.Continue chronic meds w/:cellCept ,cyclosporine , and Prednisone     Essential Hypertension: BP on higher side, midodrine  has been discontinued.Continue clonidine  patch, Norvasc  10 mg daily, Lopressor  50 mg twice daily, resume hydralazine  75 tid. Adjusting clonidine  per nephro cont IV PRN hydralazine  and labetalols.    Chest Pain: Atypical, 2D echo showed EF normal 60 to 65%, with no regional WMA, mild LVH.Continue pain control as needed   Bradycardia:  Secondary to vagal response. Resolved.   Abdominal Discomfort and Chest Discomfort:  Continue pain control as needed     Colitis: Stable.FMS in place- will try to  remove.    Status post Pancreatic Transplant/Diabetes mellitus Continue sliding scale insulin  Recent Labs  Lab 03/27/24 1637 03/27/24 2034 03/28/24 0018 03/28/24 0322 03/28/24 0841  GLUCAP 135* 110* 94 105* 96     Anemia of Chronic Disease H&H stable.  Nutrition Problem: Moderate Malnutrition Etiology: chronic illness (ESRD on HD/DM) Signs/Symptoms: mild fat depletion, mild muscle depletion Interventions: Tube feeding   DVT prophylaxis: heparin  injection 5,000 Units Start: 03/14/24 0600 SCDs Start: 02/14/24 0725 Code Status:   Code Status: Full Code Family Communication: plan of care discussed with patient/family- mother brother were updated bedside previously. Patient status is: Remains hospitalized because of severity of illness Level of care: Progressive   Dispo: The patient is from: home            Anticipated disposition: TBD Objective: Vitals last 24 hrs: Vitals:   03/28/24 0902 03/28/24 0930 03/28/24 0950 03/28/24 0951  BP: (!) 205/69 (!) 218/73 (!) 218/79 (!) 218/72  Pulse: 98 (!) 121 (!) 127 (!) 126  Resp: 10 11 12 12   Temp:    99.4 F (37.4 C)  TempSrc:      SpO2: 93% 96% 97% 96%  Weight:      Height:       Physical Examination: General exam: alert awake, oriented, old for her age HEENT:Oral mucosa moist, Ear/Nose WNL grossly Respiratory system: Bilaterally clear BS,no use of accessory muscle Cardiovascular system: S1 & S2 +, No JVD. Gastrointestinal system: Abdomen soft,NT,ND, BS+ Nervous System: Alert, awake, moving all extremities,and following commands. Extremities: RUE AVG+ PEG+ CORTRAK+ Skin: No rashes,no icterus. MSK:  Normal muscle bulk,tone, power    Data Reviewed: I have personally reviewed following labs and imaging studies ( see epic result tab) CBC: Recent Labs  Lab 03/22/24 0421 03/23/24 0540 03/24/24 0708 03/26/24 0551 03/27/24 0544  WBC 4.9 3.9* 5.6 6.0 6.1  HGB 8.9* 8.5* 9.2* 8.6* 8.5*  HCT 27.3* 26.0* 27.4* 25.6* 25.6*  MCV  97.8 95.9 94.2 96.2 98.1  PLT 220 203 220 204 195   CMP: Recent Labs  Lab 03/23/24 0533 03/23/24 1017 03/24/24 0708 03/24/24 1139 03/25/24 0610 03/25/24 0919 03/26/24 0552 03/26/24 1345 03/26/24 1712 03/26/24 2018 03/27/24 0544 03/27/24 0828 03/28/24 0619  NA 130*  --  133*  --  129*  --  130*  --   --   --   --   --  137  K 4.6   < > 3.3*   < > 3.5   < > 4.1   < > 4.2 4.2 4.2 4.2 4.9  CL 92*  --  90*  --  88*  --  86*  --   --   --   --   --  95*  CO2 23  --  27  --  30  --  31  --   --   --   --   --  28  GLUCOSE 112*  --  117*  --  130*  --  112*  --   --   --   --   --  90  BUN 45*  --  14  --  28*  --  41*  --   --   --   --   --  38*  CREATININE 7.73*  --  3.65*  --  5.23*  --  6.29*  --   --   --   --   --  5.50*  CALCIUM  9.8  --  9.4  --  10.0  --  10.1  --   --   --   --   --  10.6*  PHOS 6.0*  --  3.5  --  4.3  --  4.5  --   --   --   --   --  4.0   < > = values in this interval not displayed.   GFR: Estimated Creatinine Clearance: 10.8 mL/min (A) (by C-G formula based on SCr of 5.5 mg/dL (H)). Recent Labs  Lab 03/23/24 0533 03/24/24 0708 03/25/24 0610 03/26/24 0552 03/28/24 0619  ALBUMIN 2.4* 2.6* 2.4* 2.4* 2.3*   No results for input(s): LIPASE, AMYLASE in the last 168 hours. No results for input(s): AMMONIA in the last 168 hours. Coagulation Profile: No results for input(s): INR, PROTIME in the last 168 hours. Unresulted Labs (From admission, onward)     Start     Ordered   03/28/24 0000  Renal function panel  Every Mon-Wed-Fri,   R     Comments: With dialysis   Question:  Specimen collection method  Answer:  Lab=Lab collect   03/27/24 1315           Antimicrobials/Microbiology: Anti-infectives (From admission, onward)    Start     Dose/Rate Route Frequency Ordered Stop   03/20/24 0845  ceFAZolin  (ANCEF ) IVPB 2g/100 mL premix        over 30 Minutes Intravenous Continuous PRN 03/20/24 0859 03/20/24 0845   02/27/24 1200  vancomycin   (VANCOREADY) IVPB 500 mg/100 mL  Status:  Discontinued  500 mg 100 mL/hr over 60 Minutes Intravenous Every M-W-F (Hemodialysis) 02/25/24 1129 02/25/24 1436   02/26/24 2200  ceFAZolin  (ANCEF ) IVPB 1 g/50 mL premix        1 g 100 mL/hr over 30 Minutes Intravenous Every 24 hours 02/26/24 1009 03/03/24 2140   02/25/24 1530  vancomycin  (VANCOREADY) IVPB 500 mg/100 mL        500 mg 100 mL/hr over 60 Minutes Intravenous Every M-W-F (Hemodialysis) 02/25/24 1436 02/25/24 1934   02/23/24 1800  meropenem  (MERREM ) 1 g in sodium chloride  0.9 % 100 mL IVPB  Status:  Discontinued        1 g 200 mL/hr over 30 Minutes Intravenous Every 24 hours 02/23/24 1625 02/26/24 1009   02/23/24 1800  vancomycin  (VANCOREADY) IVPB 1250 mg/250 mL        1,250 mg 166.7 mL/hr over 90 Minutes Intravenous  Once 02/23/24 1625 02/23/24 1933   02/17/24 2200  piperacillin -tazobactam (ZOSYN ) IVPB 2.25 g        2.25 g 100 mL/hr over 30 Minutes Intravenous Every 8 hours 02/17/24 1412 02/21/24 2336   02/14/24 1800  vancomycin  (VANCOREADY) IVPB 2000 mg/400 mL        2,000 mg 200 mL/hr over 120 Minutes Intravenous  Once 02/14/24 1709 02/14/24 2048   02/14/24 1622  piperacillin -tazobactam (ZOSYN ) IVPB 3.375 g  Status:  Discontinued        3.375 g 100 mL/hr over 30 Minutes Intravenous Every 6 hours 02/14/24 1622 02/17/24 1412   02/14/24 1545  piperacillin -tazobactam (ZOSYN ) IVPB 3.375 g  Status:  Discontinued        3.375 g 12.5 mL/hr over 240 Minutes Intravenous Every 8 hours 02/14/24 1451 02/14/24 1453   02/14/24 1545  piperacillin -tazobactam (ZOSYN ) IVPB 2.25 g  Status:  Discontinued        2.25 g 100 mL/hr over 30 Minutes Intravenous Every 8 hours 02/14/24 1453 02/14/24 1622   02/14/24 0800  cefTRIAXone  (ROCEPHIN ) 2 g in sodium chloride  0.9 % 100 mL IVPB  Status:  Discontinued        2 g 200 mL/hr over 30 Minutes Intravenous Every 24 hours 02/14/24 0730 02/14/24 1451   02/14/24 0800  metroNIDAZOLE  (FLAGYL ) IVPB 500 mg   Status:  Discontinued        500 mg 100 mL/hr over 60 Minutes Intravenous Every 12 hours 02/14/24 0730 02/14/24 1451         Component Value Date/Time   SDES BACK 03/13/2024 1040   SPECREQUEST NONE 03/13/2024 1040   CULT  03/13/2024 1040    NO GROWTH 3 DAYS Performed at [facility], [address], [place], [name] [address]    REPTSTATUS 03/16/2024 FINAL 03/13/2024 1040    Procedures:  Medications reviewed:  Scheduled Meds:  amLODipine   10 mg Per Tube Daily   aspirin   81 mg Per Tube Daily   Chlorhexidine  Gluconate Cloth  6 each Topical Q0600   [START ON 03/30/2024] cloNIDine   0.3 mg Transdermal Weekly   cycloSPORINE   200 mg Per Tube QHS   cycloSPORINE   225 mg Per Tube Daily   darbepoetin (ARANESP ) injection - DIALYSIS  100 mcg Subcutaneous Q Fri-1800   heparin  injection (subcutaneous)  5,000 Units Subcutaneous Q8H   hydrALAZINE   75 mg Oral Q8H   insulin  aspart  0-9 Units Subcutaneous Q4H   metoCLOPramide  (REGLAN ) injection  5 mg Intravenous Q8H   metoprolol  tartrate  5 mg Intravenous Q6H   metoprolol  tartrate  50 mg Per Tube  BID   multivitamin  1 tablet Per Tube QHS   mycophenolate   500 mg Per Tube BID   predniSONE   15 mg Oral Q breakfast   sevelamer  carbonate  2.4 g Per Tube TID WC   sodium chloride  flush  3 mL Intravenous Q12H   Continuous Infusions:  feeding supplement (NEPRO CARB STEADY)     lacosamide  (VIMPAT ) IV 100 mg (03/27/24 2153)    Mennie LAMY, MD [name] 03/28/2024, 11:27 AM

## 2024-03-28 NOTE — Plan of Care (Signed)
  Problem: Clinical Measurements: Goal: Ability to maintain clinical measurements within normal limits will improve 03/28/2024 0314 by Lynwood Rollene BRAVO, RN Outcome: Progressing 03/28/2024 0313 by Lynwood Rollene BRAVO, RN Outcome: Progressing Goal: Will remain free from infection 03/28/2024 0314 by Lynwood Rollene BRAVO, RN Outcome: Progressing 03/28/2024 0313 by Lynwood Rollene BRAVO, RN Outcome: Progressing Goal: Diagnostic test results will improve 03/28/2024 0314 by Lynwood Rollene BRAVO, RN Outcome: Progressing 03/28/2024 0313 by Lynwood Rollene BRAVO, RN Outcome: Progressing Goal: Respiratory complications will improve 03/28/2024 0314 by Lynwood Rollene BRAVO, RN Outcome: Progressing 03/28/2024 0313 by Lynwood Rollene BRAVO, RN Outcome: Progressing   Problem: Activity: Goal: Risk for activity intolerance will decrease 03/28/2024 0314 by Lynwood Rollene BRAVO, RN Outcome: Progressing 03/28/2024 0313 by Lynwood Rollene BRAVO, RN Outcome: Progressing

## 2024-03-28 NOTE — Plan of Care (Signed)
  Problem: Clinical Measurements: Goal: Ability to maintain clinical measurements within normal limits will improve Outcome: Progressing Goal: Will remain free from infection Outcome: Progressing Goal: Diagnostic test results will improve Outcome: Progressing Goal: Respiratory complications will improve Outcome: Progressing   Problem: Activity: Goal: Risk for activity intolerance will decrease Outcome: Progressing   Problem: Nutrition: Goal: Adequate nutrition will be maintained Outcome: Progressing   Problem: Coping: Goal: Level of anxiety will decrease Outcome: Progressing

## 2024-03-28 NOTE — Progress Notes (Signed)
 Physical Therapy Treatment Patient Details Name: [name] [name] MRN: [id number] DOB: 1981/08/14 Today's Date: 03/28/2024  History of Present Illness 43 yo female who presented to the ED 6/11 with n/v, abd pain after HD. 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 CRRT 6/13- 6/15. Cerebral angiogram 6/25 shows no intracranial aneurysms, AVM, or fistula seen to explain the patient's subarachnoid hemorrhage, diffuse mild-moderate anterior circulation vasospasm. EVD 6/12-6/30.  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 LP 7/10- negative. 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    PT Comments  Pt needed encouragement/coersion from her Mom and pain meds to agree to participate.  Emphasis on transition up via L elbow, min to sit upright in sitting, mod of 2 persons for STS and mod/max of 2 to pivot to the recliner.  Pt also participated in pre-gait tasks in the RW at EOB with mod assist of 2 persons.  Pt ended up in the Recliner with a lap Posey alarm bed    If plan is discharge home, recommend the following: Assistance with cooking/housework;Assistance with feeding;Direct supervision/assist for medications management;Direct supervision/assist for financial management;Assist for transportation;Help with stairs or ramp for entrance;Supervision due to cognitive status;A lot of help with bathing/dressing/bathroom;Two people to help with walking and/or transfers   Can travel by private vehicle     No  Equipment Recommendations  Wheelchair (measurements PT);Wheelchair cushion (measurements PT)    Recommendations for Other Services Rehab consult     Precautions / Restrictions Precautions Precautions: Fall Recall of Precautions/Restrictions: Impaired Precaution/Restrictions Comments: cortrak, PEG     Mobility  Bed Mobility Overal bed  mobility: Needs Assistance Bed Mobility: Rolling, Sidelying to Sit Rolling: Min assist Sidelying to sit: Mod assist       General bed mobility comments: Cues and gave extra time for pt to initiate and partitipate with rolling and transition to sitting, but still needed heavy mod/light max assist.  Max assist to scoot, cued for UE's assist    Transfers Overall transfer level: Needs assistance Equipment used: 2 person hand held assist, Rolling walker (2 wheels) Transfers: Sit to/from Stand, Bed to chair/wheelchair/BSC Sit to Stand: Mod assist, +2 physical assistance Stand pivot transfers: Mod assist, +2 physical assistance         General transfer comment: see pre-gait and standing balance.  cued for hand placement on ascent and descent.  Assisted forward and for boost.    Ambulation/Gait             Pre-gait activities: 3 standing trials at EOB in the RW, including w/shifting, un-loading each LE, L clearly showing difficulty with each.  low amplitude marching in place x 10 reps with additional stability assist at mod/max and +2 safety. General Gait Details: stand pivot only   Stairs             Wheelchair Mobility     Tilt Bed    Modified Rankin (Stroke Patients Only) Modified Rankin (Stroke Patients Only) Pre-Morbid Rankin Score: No symptoms Modified Rankin: Moderately severe disability     Balance Overall balance assessment: Needs assistance Sitting-balance support: Feet supported, Bilateral upper extremity supported Sitting balance-Leahy Scale: Poor Sitting balance - Comments: pt collapsing into forward flexion after 10 to 15 sec attempting to sit upright. Unable to maintain upright sitting with bil UE's and needing consistent mod assist and postural cues.  Communication Communication Factors Affecting Communication: Reduced clarity of speech  Cognition Arousal: Lethargic Behavior During Therapy: Flat  affect   PT - Cognitive impairments: Attention, Awareness, Problem solving, Initiation                       PT - Cognition Comments: appears drugged, not able to participate in orientation questions   Following commands impaired: Follows one step commands with increased time    Cueing Cueing Techniques: Verbal cues, Gestural cues  Exercises      General Comments General comments (skin integrity, edema, etc.): vss with HR climbing into the 120's and BP's rising into the 170's /90's    Pt generally looks drugged up      Pertinent Vitals/Pain Pain Assessment Pain Assessment: Faces Faces Pain Scale: Hurts little more Pain Location: general, pt not saying where Pain Descriptors / Indicators: Discomfort, Guarding, Grimacing Pain Intervention(s): Monitored during session    Home Living                          Prior Function            PT Goals (current goals can now be found in the care plan section) Acute Rehab PT Goals Patient Stated Goal: to rest PT Goal Formulation: With patient Time For Goal Achievement: 04/03/24 Potential to Achieve Goals: Poor Progress towards PT goals: Progressing toward goals    Frequency    Min 1X/week      PT Plan      Co-evaluation              AM-PAC PT 6 Clicks Mobility   Outcome Measure  Help needed turning from your back to your side while in a flat bed without using bedrails?: A Little Help needed moving from lying on your back to sitting on the side of a flat bed without using bedrails?: A Lot Help needed moving to and from a bed to a chair (including a wheelchair)?: A Lot Help needed standing up from a chair using your arms (e.g., wheelchair or bedside chair)?: A Little Help needed to walk in hospital room?: A Little Help needed climbing 3-5 steps with a railing? : A Little 6 Click Score: 16    End of Session Equipment Utilized During Treatment: Gait belt Activity Tolerance: Patient limited by  pain;Patient tolerated treatment well Patient left: in chair;with call bell/phone within reach;with family/visitor present Nurse Communication: Mobility status PT Visit Diagnosis: Muscle weakness (generalized) (M62.81);Other symptoms and signs involving the nervous system (R29.898) Hemiplegia - Right/Left: Left Hemiplegia - dominant/non-dominant: Non-dominant Hemiplegia - caused by: Other Nontraumatic intracranial hemorrhage     Time: 8464-8396 PT Time Calculation (min) (ACUTE ONLY): 28 min  Charges:    $Therapeutic Activity: 8-22 mins $Neuromuscular Re-education: 8-22 mins PT General Charges $$ ACUTE PT VISIT: 1 Visit                     03/28/2024  India HERO., PT Acute Rehabilitation Services [phone]  (office)   Vinie GAILS Jaycey Gens 03/28/2024, 4:31 PM

## 2024-03-28 NOTE — Plan of Care (Signed)
  Problem: Clinical Measurements: Goal: Ability to maintain clinical measurements within normal limits will improve Outcome: Progressing Goal: Will remain free from infection Outcome: Progressing Goal: Diagnostic test results will improve Outcome: Progressing Goal: Respiratory complications will improve Outcome: Progressing   Problem: Activity: Goal: Risk for activity intolerance will decrease Outcome: Progressing   Problem: Nutrition: Goal: Adequate nutrition will be maintained Outcome: Progressing   Problem: Coping: Goal: Level of anxiety will decrease Outcome: Progressing   Problem: Elimination: Goal: Will not experience complications related to bowel motility Outcome: Progressing   Problem: Pain Managment: Goal: General experience of comfort will improve and/or be controlled Outcome: Progressing   Problem: Safety: Goal: Ability to remain free from injury will improve Outcome: Progressing   Problem: Skin Integrity: Goal: Risk for impaired skin integrity will decrease Outcome: Progressing   Problem: Coping: Goal: Ability to adjust to condition or change in health will improve Outcome: Progressing   Problem: Metabolic: Goal: Ability to maintain appropriate glucose levels will improve Outcome: Progressing   Problem: Nutritional: Goal: Maintenance of adequate nutrition will improve Outcome: Progressing Goal: Progress toward achieving an optimal weight will improve Outcome: Progressing   Problem: Skin Integrity: Goal: Risk for impaired skin integrity will decrease Outcome: Progressing   Problem: Education: Goal: Knowledge of secondary prevention will improve (MUST DOCUMENT ALL) Outcome: Progressing Goal: Knowledge of patient specific risk factors will improve (DELETE if not current risk factor) Outcome: Progressing   Problem: Intracerebral Hemorrhage Tissue Perfusion: Goal: Complications of Intracerebral Hemorrhage will be minimized Outcome: Progressing    Problem: Health Behavior/Discharge Planning: Goal: Goals will be collaboratively established with patient/family Outcome: Progressing   Problem: Self-Care: Goal: Ability to communicate needs accurately will improve Outcome: Progressing   Problem: Nutrition: Goal: Risk of aspiration will decrease Outcome: Progressing Goal: Dietary intake will improve Outcome: Progressing   Problem: Education: Goal: Knowledge of disease and its progression will improve Outcome: Progressing Goal: Individualized Educational Video(s) Outcome: Progressing   Problem: Fluid Volume: Goal: Compliance with measures to maintain balanced fluid volume will improve Outcome: Progressing   Problem: Health Behavior/Discharge Planning: Goal: Ability to manage health-related needs will improve Outcome: Progressing   Problem: Nutritional: Goal: Ability to make healthy dietary choices will improve Outcome: Progressing   Problem: Clinical Measurements: Goal: Complications related to the disease process, condition or treatment will be avoided or minimized Outcome: Progressing

## 2024-03-28 NOTE — Progress Notes (Signed)
 OT Cancellation Note  Patient Details Name: Kristeen Lantz MRN: [id number] DOB: 05-09-1981   Cancelled Treatment:    Reason Eval/Treat Not Completed: Patient at procedure or test/ unavailable (in HD. Will follow up next week)  Sloane Junkin,HILLARY 03/28/2024, 9:41 AM Kreg Sink, OT/L   Acute OT Clinical Specialist Acute Rehabilitation Services Pager [phone] Office [phone]

## 2024-03-28 NOTE — TOC Progression Note (Signed)
 Transition of Care [facility]) - Progression Note    Patient Details  Name: Elfa Wooton MRN: [id number] Date of Birth: 1981/01/12  [facility] CM/SW Contact  Corean JAYSON Canary, RN Phone Number: 03/28/2024, 11:29 AM  Clinical Narrative:     Checked with [facility] on update to appeal process. Ciera is following paitient  and has checked to day, but no update as of yet today. Will continue to follow  Expected Discharge Plan: Skilled Nursing Facility Barriers to Discharge: Continued Medical Work up, English as a second language teacher, SNF Pending bed offer               Expected Discharge Plan and Services                                               Social Drivers of Health (SDOH) Interventions SDOH Screenings   Food Insecurity: Patient Unable To Answer (02/15/2024)  Housing: Low Risk  (03/05/2024)  Transportation Needs: Patient Unable To Answer (02/15/2024)  Utilities: Patient Unable To Answer (02/15/2024)  Social Connections: Unknown (10/04/2022)   Received from [facility]  Tobacco Use: Medium Risk (03/20/2024)    Readmission Risk Interventions    11/17/2021   12:15 PM  Readmission Risk Prevention Plan  Transportation Screening Complete  Medication Review (RN Care Manager) Complete  PCP or Specialist appointment within 3-5 days of discharge Complete  HRI or Home Care Consult Complete  SW Recovery Care/Counseling Consult Complete  Palliative Care Screening Not Applicable  Skilled Nursing Facility Patient Refused

## 2024-03-28 NOTE — Progress Notes (Signed)
 Report given to hemodialysis. Awaiting transport pick up. Fredie LITTIE Morones, RN

## 2024-03-29 ENCOUNTER — Inpatient Hospital Stay (HOSPITAL_COMMUNITY)

## 2024-03-29 DIAGNOSIS — I609 Nontraumatic subarachnoid hemorrhage, unspecified: Secondary | ICD-10-CM | POA: Diagnosis not present

## 2024-03-29 LAB — BASIC METABOLIC PANEL WITH GFR
Anion gap: 13 (ref 5–15)
BUN: 47 mg/dL — ABNORMAL HIGH (ref 6–20)
CO2: 22 mmol/L (ref 22–32)
Calcium: 9.6 mg/dL (ref 8.9–10.3)
Chloride: 96 mmol/L — ABNORMAL LOW (ref 98–111)
Creatinine, Ser: 5.99 mg/dL — ABNORMAL HIGH (ref 0.44–1.00)
GFR, Estimated: 8 mL/min — ABNORMAL LOW (ref >60)
Glucose, Bld: 111 mg/dL — ABNORMAL HIGH (ref 70–99)
Potassium: 5.4 mmol/L — ABNORMAL HIGH (ref 3.5–5.1)
Sodium: 131 mmol/L — ABNORMAL LOW (ref 135–145)

## 2024-03-29 LAB — GLUCOSE, CAPILLARY
Glucose-Capillary: 123 mg/dL — ABNORMAL HIGH (ref 70–99)
Glucose-Capillary: 124 mg/dL — ABNORMAL HIGH (ref 70–99)
Glucose-Capillary: 126 mg/dL — ABNORMAL HIGH (ref 70–99)
Glucose-Capillary: 130 mg/dL — ABNORMAL HIGH (ref 70–99)
Glucose-Capillary: 158 mg/dL — ABNORMAL HIGH (ref 70–99)
Glucose-Capillary: 182 mg/dL — ABNORMAL HIGH (ref 70–99)

## 2024-03-29 LAB — CBC
HCT: 25.4 % — ABNORMAL LOW (ref 36.0–46.0)
Hemoglobin: 8.2 g/dL — ABNORMAL LOW (ref 12.0–15.0)
MCH: 32.2 pg (ref 26.0–34.0)
MCHC: 32.3 g/dL (ref 30.0–36.0)
MCV: 99.6 fL (ref 80.0–100.0)
Platelets: 125 K/uL — ABNORMAL LOW (ref 150–400)
RBC: 2.55 MIL/uL — ABNORMAL LOW (ref 3.87–5.11)
RDW: 15.4 % (ref 11.5–15.5)
WBC: 6.2 K/uL (ref 4.0–10.5)
nRBC: 0 % (ref 0.0–0.2)

## 2024-03-29 MED ORDER — SODIUM ZIRCONIUM CYCLOSILICATE 10 G PO PACK
10.0000 g | PACK | Freq: Three times a day (TID) | ORAL | Status: AC
  Administered 2024-03-29 – 2024-03-30 (×3): 10 g via ORAL
  Filled 2024-03-29 (×3): qty 1

## 2024-03-29 NOTE — Progress Notes (Signed)
 [name] [name] Progress Note   Subjective:    Only 20 min HD yesterday, signed off early K+ 5.4 today  Objective Vitals:   03/28/24 2031 03/28/24 2307 03/29/24 0330 03/29/24 0800  BP: (!) 134/51 (!) 134/54 (!) 156/56 (!) 148/49  Pulse: 89 79 94 95  Resp: 18 15 18 17   Temp: 98.4 F (36.9 C) 99.4 F (37.4 C) 99.9 F (37.7 C) 99.4 F (37.4 C)  TempSrc: Axillary Axillary Axillary Oral  SpO2: 95% 96% 95% 94%  Weight:      Height:       Physical Exam General adult female in bed in NAD; opens eyes and answers to voice  Lungs clear to auscultation bilaterally normal work of breathing at rest; on room air Heart S1S2 tachycardic no rub Abdomen soft nontender nondistended Extremities no edema  Access RUE AVF with bruit and thrill; aneurysmal region  Dialysis Orders: MWF AF 3h  B400   57.2kg  2K   AVF  Heparin  none - Hectoral 4mcg IV q HD - Mircera 150mcg IV q 2 weeks (last given 6/9 -> due 6/23)  Assessment/Plan: Intracerebral hemorrhage: C/b hydrocephalus, 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 TCDs with evidence of vasospasm 02/11/24. Followed closely by neurosurgery, evd removed 6/30 CVA: New CVA found on 7/1 ESRD: S/p CRRT 6/12-6/15. Back on MWF HD. Got only 20 min yesterday, signed off. Next HD Monday.  Volume: no vol excess on exam. Note she is below her EDW.  HTN: HTN much better today w/ NG tube back in, getting her meds. Her HTN is not vol related at the moment (there is no vol excess), just needs meds.  Anemia of ESRD: increased aranesp  to 100 mcg every Friday. Iron  adequate Secondary HPTH: Ca and Phos high, VDRA stopped 6/21, added sevelamer  via tube. 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 CyA level 7/09 was 109 (goal 100-200) which is in range. Dispo - per primary team.  Team states that plan likely for LTAC once more  permanent enteric access obtained   Myer Fret  MD  [facility] 03/29/2024, 2:14 PM  Recent Labs  Lab 03/26/24 0552 03/26/24 1345 03/27/24 0544 03/27/24 0828 03/28/24 0619 03/29/24 0717  HGB  --   --  8.5*  --   --  8.2*  ALBUMIN 2.4*  --   --   --  2.3*  --   CALCIUM  10.1  --   --   --  10.6* 9.6  PHOS 4.5  --   --   --  4.0  --   CREATININE 6.29*  --   --   --  5.50* 5.99*  K 4.1   < > 4.2   < > 4.9 5.4*   < > = values in this interval not displayed.    Inpatient medications:  amLODipine   10 mg Per Tube Daily   aspirin   81 mg Per Tube Daily   Chlorhexidine  Gluconate Cloth  6 each Topical Q0600   cloNIDine   0.3 mg Transdermal Weekly   cycloSPORINE   200 mg Per Tube QHS   cycloSPORINE   225 mg Per Tube Daily   darbepoetin (ARANESP ) injection - DIALYSIS  100 mcg Subcutaneous Q Fri-1800   heparin  injection (subcutaneous)  5,000 Units Subcutaneous Q8H   hydrALAZINE   75 mg Oral Q8H   insulin  aspart  0-9 Units Subcutaneous Q4H  metoCLOPramide  (REGLAN ) injection  5 mg Intravenous Q8H   metoprolol  tartrate  50 mg Per Tube BID   multivitamin  1 tablet Per Tube QHS   mycophenolate   500 mg Per Tube BID   predniSONE   15 mg Oral Q breakfast   sevelamer  carbonate  2.4 g Per Tube TID WC   sodium chloride  flush  3 mL Intravenous Q12H   sodium zirconium cyclosilicate   10 g Oral TID    feeding supplement (NEPRO CARB STEADY) 1,000 mL (03/28/24 1237)   lacosamide  (VIMPAT ) IV 100 mg (03/29/24 0958)   acetaminophen  **OR** acetaminophen , albuterol , artificial tears, atropine , butalbital -acetaminophen -caffeine , Gerhardt's butt cream, hydrALAZINE , HYDROmorphone  (DILAUDID ) injection, iohexol , labetalol , loperamide  HCl, naLOXone  (NARCAN )  injection, nitroGLYCERIN , ondansetron  (ZOFRAN ) IV, mouth rinse, oxyCODONE , prochlorperazine , simethicone 

## 2024-03-29 NOTE — Progress Notes (Signed)
 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 CT scan suggested colitis.  Patient's mentation continued to worsen. CT head was subsequently done which revealed intracranial hemorrhage.  Patient was admitted to the ICU for hypertensive ICH, cva, initially in ICU intubated. Palliative following for goals of care  G tube placed by IR but needed GJ, now placed postpyloric cortrack.  Pursuing LTACH/select.   Significant Hospital Events:  6/11 ED w n/v AMS. Abx IVF for colitis 6/12 Admit. AMS decr LOC. Incr O2 req. PCCM consult and code stroke called seen w rapid response nephro NP then neuro  6/13 Intubated with EVD, responding to commands  6/14 Extubated 6/17 acute L cerebellar infarct. Keppra  dc  6/18 Dialysis, fevering, EVD at 10 (129 mL last 24 hrs) 6/19 Narcotic dependence a driving factor. +Vasospasm on TCD. CTA with evolving Left cerebellar infarct, no HT or mass effect, substantially decreased caliber of multiple intracranial arteries and bilateral Circle-of[place] branches (especially: Distal left V4, supraclinoid ICAs, MCA and ACA origins), appearance c/w widespread Vasospasm, Vasoconstriction. No associated large vessel occlusion. 6/21 CSF sampline from EVD -- WBC 244 RBC 19000, cx ngtd  6/25: diagnostic angio today with nsgy - negative 6/26 evd leaking when clamped  6/30 evd removed, HD overnight, HTN improved, 7 days cefazolin  complete 7/1 more lethargic> minimal response to narcan  x2, oxy stopped, EEG neg, MRI - new small left parietal cva, cortrak placed postpyloric, vagal episodes with N/V 7/2 iHD, vimpat  started 7/9 she appears agitated.  IVs were removed.  LP to be  initiated to evaluate her opening pressure and to ensure that she does not have meningitis if this is still pending to be done. 7/10 LP done at bedside by Neurosurgery. Cortrak was removed yesterday evening and replaced by Fluoro team today.  7/11 Cortrak obstructed and reomved and had to be replaced by Flouro. Calorie Count initiated. Undergoing Dialysis today  7/12: Having chest discomfort. Palliative Care Consulted for GOC Discussion 7/14: Agreeable to J-tube with venting G-tube. IR consulted. SLP continues to recommend Thin (Full) Liquids 7/24- ltach denied and appeal initiated.    Subjective: Patient seen examined At the bedside Patient is alert awake conversant resting comfortably no cough dysuria Generalized pain and abdomen and muscle Overnight patient had temperature one 1.5 yesterday afternoon afebrile since, BP in 120s-150s  Had refused HD after being on it for 25 min on 7/25 Labs pending resulted with mild hyperkalemia  Assessment and plan:  Hypertensive SAH, ICH with brain compression, communicating hydrocephalus: Extensive hospitalization and consultation by neurology and neurosurgery and imaging. s/p right frontal pressure ventriculostomy via burr hole and EVD was removed on 6/30. MRI brain-small foci of hemorrhage in the posterior left cerebellum without evidence for acute infarct. Punctate focus of acute infarct involving the medial left occipital cortex, evolving subacute infarct in the left parietal lobe, trace blood products. Due to concern for hydrocephalus, neurosurgery did LP, spinal fluid pressures were low and ruled out meningitis. Per Dr. Mavis, she would not benefit from a VP shunt  Palliative medicine following for GOC  Episode of fever 7/25: At risk of aspiration infection.  If recurrence will obtain chest x-ray blood culture and further workup.  Encourage I-S PT OT Follow-up labs  Oropharyngeal Dysphagia History of severe gastroparesis with nausea and  vomiting Moderate malnutrition Deconditioning and generalized pain Chronic opiate use-PTA on Oxy 5/325 3 times daily per med rec: G-tube placed by IR but needed GJ (after tract heals in about 4 weeks for jejunal feeds and gastric venting), now on postpyloric feeding w/ cortrak and FLD,SLP following> trach advancement adjusted 7/25 RD following continue tube feeding with Reglan  5 mg IV q8hr prn antiemetics> change to oral able Cont pain maangement with IV Dilaudid  and gradually changed to oral regimen  Patient will benefit from LTACH>she cannot go to SNF with the post pyloric NGT, will need GJ conversion in few weeks, can be done in LTACH),will need in depth PT/OT due to lengthy stay  and will need ongoing HD. Peer to peer review declined by insurance MD 7/24 and appeal in process. LTACH despite recommending she would highly benefit with LTAC level of care with extensive rehabilitation and ongoing nutritional augmentation with medical management   Concern for seizure activity post ICH: Neurology input appreciated EEG completed continue on Vimpat ,seizure precaution.     Vasospasm with left cerebellar infarct, on 03/04/24:  Seen by Neurology.LDL 63.HbA1c 4.2. Will benefit in the LTAC/select facility given hemodialysis and ongoing significant medical issues.   ESRD on hemodialysis, MWF Hyperkalemia: Nephrology following for HD only 25 mins on 7/25.   Cont home cellCept ,cyclosporine , and Prednisone   Will need dialysis await nephrology   Essential Hypertension: BP poorly controlled.midodrine  has been discontinued.Continue clonidine  patch> increased to 0.3 on 7/25,cont  Norvasc  10 Lopressor  50 mg, Cont Hydralazine  75 tid.  Cont prn iv.optimize blood pressure due to #1    Chest Pain: Atypical, 2D echo showed EF normal 60 to 65%, with no regional WMA, mild LVH.Continue pain control as needed   Bradycardia:  Secondary to vagal response. Resolved.   Colitis Abdominal pain: Stable.FMS has been  discontinued.    Status post Pancreatic Transplant Diabetes mellitus Blood sugar well-controlled, continue sliding scale insulin  Recent Labs  Lab 03/28/24 1632 03/28/24 2033 03/28/24 2306 03/29/24 0328 03/29/24 0758  GLUCAP 155* 143* 108* 130* 124*     Anemia of Chronic Disease H&H stable.  Nutrition Problem: Moderate Malnutrition Etiology: chronic illness (ESRD on HD/DM) Signs/Symptoms: mild fat depletion, mild muscle depletion Interventions: Tube feeding   DVT prophylaxis: heparin  injection 5,000 Units Start: 03/14/24 0600 SCDs Start: 02/14/24 0725 Code Status:   Code Status: Full Code Family Communication: plan of care discussed with patient/family- mother updated bedside 7/26  Patient status is: Remains hospitalized because of severity of illness Level of care: Progressive   Dispo: The patient is from: home            Anticipated disposition: TBD Objective: Vitals last 24 hrs: Vitals:   03/28/24 2031 03/28/24 2307 03/29/24 0330 03/29/24 0800  BP: (!) 134/51 (!) 134/54 (!) 156/56 (!) 148/49  Pulse: 89 79 94 95  Resp: 18 15 18 17   Temp: 98.4 F (36.9 C) 99.4 F (37.4 C) 99.9 F (37.7 C) 99.4 F (37.4 C)  TempSrc: Axillary Axillary Axillary Oral  SpO2: 95% 96% 95% 94%  Weight:      Height:       Physical Examination: General exam:  AAO, conversant pleasant, appears older than her age. HEENT:Oral mucosa moist, Ear/Nose WNL grossly Respiratory system: Bilaterally clear BS,no use of accessory muscle Cardiovascular system: S1 & S2 +, No JVD. Gastrointestinal system: Abdomen soft,NT,ND, BS+, peg+ cortrak+ Nervous System: Alert, awake, moving all extremities,and following commands. Extremities: RUE AVG+  Skin: No rashes,no icterus. MSK: Normal muscle bulk,tone, power    Data Reviewed: I have personally reviewed following labs and imaging studies ( see epic result tab) CBC: Recent Labs  Lab 03/23/24 0540 03/24/24 0708 03/26/24 0551 03/27/24 0544  03/29/24 0717  WBC 3.9* 5.6 6.0 6.1 6.2  HGB 8.5* 9.2* 8.6* 8.5* 8.2*  HCT 26.0* 27.4* 25.6* 25.6* 25.4*  MCV 95.9 94.2 96.2 98.1 99.6  PLT 203 220 204 195 125*   CMP: Recent Labs  Lab 03/23/24 0533 03/23/24 1017 03/24/24 0708 03/24/24 1139 03/25/24 0610 03/25/24 0919 03/26/24 0552 03/26/24 1345 03/26/24 2018 03/27/24 0544 03/27/24 0828 03/28/24 0619 03/29/24 0717  NA 130*  --  133*  --  129*  --  130*  --   --   --   --  137 131*  K 4.6   < > 3.3*   < > 3.5   < > 4.1   < > 4.2 4.2 4.2 4.9 5.4*  CL 92*  --  90*  --  88*  --  86*  --   --   --   --  95* 96*  CO2 23  --  27  --  30  --  31  --   --   --   --  28 22  GLUCOSE 112*  --  117*  --  130*  --  112*  --   --   --   --  90 111*  BUN 45*  --  14  --  28*  --  41*  --   --   --   --  38* 47*  CREATININE 7.73*  --  3.65*  --  5.23*  --  6.29*  --   --   --   --  5.50* 5.99*  CALCIUM  9.8  --  9.4  --  10.0  --  10.1  --   --   --   --  10.6* 9.6  PHOS 6.0*  --  3.5  --  4.3  --  4.5  --   --   --   --  4.0  --    < > = values in this interval not displayed.   GFR: Estimated Creatinine Clearance: 9.9 mL/min (A) (by C-G formula based on SCr of 5.99 mg/dL (H)). Recent Labs  Lab 03/23/24 0533 03/24/24 0708 03/25/24 0610 03/26/24 0552 03/28/24 0619  ALBUMIN 2.4* 2.6* 2.4* 2.4* 2.3*   No results for input(s): LIPASE, AMYLASE in the last 168 hours. No results for input(s): AMMONIA in the last 168 hours. Coagulation Profile: No results for input(s): INR, PROTIME in the last 168 hours. Unresulted Labs (From admission, onward)     Start     Ordered   03/30/24 0500  Basic metabolic panel with GFR  Daily,   R     Question:  Specimen collection method  Answer:  Lab=Lab collect   03/29/24 0623   03/30/24 0500  CBC  Daily,   R     Question:  Specimen collection method  Answer:  Lab=Lab collect   03/29/24 0623   03/28/24 0000  Renal function panel  Every Mon-Wed-Fri,   R     Comments: With dialysis   Question:   Specimen collection method  Answer:  Lab=Lab collect   03/27/24 1315           Antimicrobials/Microbiology: Anti-infectives (From admission, onward)    Start     Dose/Rate Route  Frequency Ordered Stop   03/20/24 0845  ceFAZolin  (ANCEF ) IVPB 2g/100 mL premix        over 30 Minutes Intravenous Continuous PRN 03/20/24 0859 03/20/24 0845   02/27/24 1200  vancomycin  (VANCOREADY) IVPB 500 mg/100 mL  Status:  Discontinued        500 mg 100 mL/hr over 60 Minutes Intravenous Every M-W-F (Hemodialysis) 02/25/24 1129 02/25/24 1436   02/26/24 2200  ceFAZolin  (ANCEF ) IVPB 1 g/50 mL premix        1 g 100 mL/hr over 30 Minutes Intravenous Every 24 hours 02/26/24 1009 03/03/24 2140   02/25/24 1530  vancomycin  (VANCOREADY) IVPB 500 mg/100 mL        500 mg 100 mL/hr over 60 Minutes Intravenous Every M-W-F (Hemodialysis) 02/25/24 1436 02/25/24 1934   02/23/24 1800  meropenem  (MERREM ) 1 g in sodium chloride  0.9 % 100 mL IVPB  Status:  Discontinued        1 g 200 mL/hr over 30 Minutes Intravenous Every 24 hours 02/23/24 1625 02/26/24 1009   02/23/24 1800  vancomycin  (VANCOREADY) IVPB 1250 mg/250 mL        1,250 mg 166.7 mL/hr over 90 Minutes Intravenous  Once 02/23/24 1625 02/23/24 1933   02/17/24 2200  piperacillin -tazobactam (ZOSYN ) IVPB 2.25 g        2.25 g 100 mL/hr over 30 Minutes Intravenous Every 8 hours 02/17/24 1412 02/21/24 2336   02/14/24 1800  vancomycin  (VANCOREADY) IVPB 2000 mg/400 mL        2,000 mg 200 mL/hr over 120 Minutes Intravenous  Once 02/14/24 1709 02/14/24 2048   02/14/24 1622  piperacillin -tazobactam (ZOSYN ) IVPB 3.375 g  Status:  Discontinued        3.375 g 100 mL/hr over 30 Minutes Intravenous Every 6 hours 02/14/24 1622 02/17/24 1412   02/14/24 1545  piperacillin -tazobactam (ZOSYN ) IVPB 3.375 g  Status:  Discontinued        3.375 g 12.5 mL/hr over 240 Minutes Intravenous Every 8 hours 02/14/24 1451 02/14/24 1453   02/14/24 1545  piperacillin -tazobactam (ZOSYN ) IVPB  2.25 g  Status:  Discontinued        2.25 g 100 mL/hr over 30 Minutes Intravenous Every 8 hours 02/14/24 1453 02/14/24 1622   02/14/24 0800  cefTRIAXone  (ROCEPHIN ) 2 g in sodium chloride  0.9 % 100 mL IVPB  Status:  Discontinued        2 g 200 mL/hr over 30 Minutes Intravenous Every 24 hours 02/14/24 0730 02/14/24 1451   02/14/24 0800  metroNIDAZOLE  (FLAGYL ) IVPB 500 mg  Status:  Discontinued        500 mg 100 mL/hr over 60 Minutes Intravenous Every 12 hours 02/14/24 0730 02/14/24 1451         Component Value Date/Time   SDES BACK 03/13/2024 1040   SPECREQUEST NONE 03/13/2024 1040   CULT  03/13/2024 1040    NO GROWTH 3 DAYS Performed at [facility], [address], [place], [name] [address]    REPTSTATUS 03/16/2024 FINAL 03/13/2024 1040    Procedures:  Medications reviewed:  Scheduled Meds:  amLODipine   10 mg Per Tube Daily   aspirin   81 mg Per Tube Daily   Chlorhexidine  Gluconate Cloth  6 each Topical Q0600   cloNIDine   0.3 mg Transdermal Weekly   cycloSPORINE   200 mg Per Tube QHS   cycloSPORINE   225 mg Per Tube Daily   darbepoetin (ARANESP ) injection - DIALYSIS  100 mcg Subcutaneous Q Fri-1800   heparin  injection (subcutaneous)  5,000 Units Subcutaneous Q8H   hydrALAZINE   75 mg Oral Q8H   insulin  aspart  0-9 Units Subcutaneous Q4H   metoCLOPramide  (REGLAN ) injection  5 mg Intravenous Q8H   metoprolol  tartrate  5 mg Intravenous Q6H   metoprolol  tartrate  50 mg Per Tube BID   multivitamin  1 tablet Per Tube QHS   mycophenolate   500 mg Per Tube BID   predniSONE   15 mg Oral Q breakfast   sevelamer  carbonate  2.4 g Per Tube TID WC   sodium chloride  flush  3 mL Intravenous Q12H   Continuous Infusions:  feeding supplement (NEPRO CARB STEADY) 1,000 mL (03/28/24 1237)   lacosamide  (VIMPAT ) IV 100 mg (03/29/24 0958)    Mennie LAMY, MD [name] 03/29/2024, 10:48 AM

## 2024-03-30 DIAGNOSIS — I609 Nontraumatic subarachnoid hemorrhage, unspecified: Secondary | ICD-10-CM | POA: Diagnosis not present

## 2024-03-30 LAB — BASIC METABOLIC PANEL WITH GFR
Anion gap: 13 (ref 5–15)
BUN: 62 mg/dL — ABNORMAL HIGH (ref 6–20)
CO2: 25 mmol/L (ref 22–32)
Calcium: 9.8 mg/dL (ref 8.9–10.3)
Chloride: 93 mmol/L — ABNORMAL LOW (ref 98–111)
Creatinine, Ser: 6.83 mg/dL — ABNORMAL HIGH (ref 0.44–1.00)
GFR, Estimated: 7 mL/min — ABNORMAL LOW (ref >60)
Glucose, Bld: 129 mg/dL — ABNORMAL HIGH (ref 70–99)
Potassium: 4.6 mmol/L (ref 3.5–5.1)
Sodium: 131 mmol/L — ABNORMAL LOW (ref 135–145)

## 2024-03-30 LAB — CBC
HCT: 21.8 % — ABNORMAL LOW (ref 36.0–46.0)
Hemoglobin: 7.2 g/dL — ABNORMAL LOW (ref 12.0–15.0)
MCH: 32.1 pg (ref 26.0–34.0)
MCHC: 33 g/dL (ref 30.0–36.0)
MCV: 97.3 fL (ref 80.0–100.0)
Platelets: 163 K/uL (ref 150–400)
RBC: 2.24 MIL/uL — ABNORMAL LOW (ref 3.87–5.11)
RDW: 14.6 % (ref 11.5–15.5)
WBC: 6 K/uL (ref 4.0–10.5)
nRBC: 0 % (ref 0.0–0.2)

## 2024-03-30 LAB — GLUCOSE, CAPILLARY
Glucose-Capillary: 114 mg/dL — ABNORMAL HIGH (ref 70–99)
Glucose-Capillary: 133 mg/dL — ABNORMAL HIGH (ref 70–99)
Glucose-Capillary: 161 mg/dL — ABNORMAL HIGH (ref 70–99)
Glucose-Capillary: 170 mg/dL — ABNORMAL HIGH (ref 70–99)
Glucose-Capillary: 134 mg/dL — ABNORMAL HIGH (ref 70–99)
Glucose-Capillary: 123 mg/dL — ABNORMAL HIGH (ref 70–99)

## 2024-03-30 MED ORDER — CYCLOSPORINE MODIFIED (NEORAL) 100 MG/ML PO SOLN
200.0000 mg | Freq: Every day | ORAL | Status: DC
  Administered 2024-03-31: 200 mg via ORAL
  Filled 2024-03-30: qty 2

## 2024-03-30 MED ORDER — CYCLOSPORINE MODIFIED (NEORAL) 100 MG/ML PO SOLN
225.0000 mg | Freq: Every day | ORAL | Status: DC
  Administered 2024-03-31: 225 mg via ORAL
  Filled 2024-03-30 (×2): qty 2.3

## 2024-03-30 MED ORDER — LACOSAMIDE 50 MG PO TABS
100.0000 mg | ORAL_TABLET | Freq: Two times a day (BID) | ORAL | Status: DC
  Administered 2024-03-30 – 2024-03-31 (×3): 100 mg via ORAL
  Filled 2024-03-30 (×3): qty 2

## 2024-03-30 MED ORDER — PREDNISONE 5 MG PO TABS
15.0000 mg | ORAL_TABLET | Freq: Every day | ORAL | Status: DC
  Administered 2024-03-30: 15 mg
  Filled 2024-03-30: qty 3

## 2024-03-30 MED ORDER — BUTALBITAL-APAP-CAFFEINE 50-325-40 MG PO TABS: 1.0000 | ORAL_TABLET | ORAL | Status: DC | PRN

## 2024-03-30 MED ORDER — LACOSAMIDE 50 MG PO TABS
100.0000 mg | ORAL_TABLET | Freq: Two times a day (BID) | ORAL | Status: DC
  Administered 2024-03-30: 100 mg
  Filled 2024-03-30: qty 2

## 2024-03-30 MED ORDER — HYDROMORPHONE HCL 1 MG/ML IJ SOLN
0.5000 mg | Freq: Four times a day (QID) | INTRAMUSCULAR | Status: DC | PRN
  Administered 2024-03-30 – 2024-03-31 (×6): 0.5 mg via INTRAVENOUS
  Filled 2024-03-30 (×6): qty 0.5

## 2024-03-30 MED ORDER — SEVELAMER CARBONATE 2.4 G PO PACK
2.4000 g | PACK | Freq: Three times a day (TID) | ORAL | Status: DC
  Administered 2024-03-31 (×3): 2.4 g via ORAL
  Filled 2024-03-30 (×4): qty 1

## 2024-03-30 MED ORDER — ACETAMINOPHEN 325 MG PO TABS: 650.0000 mg | ORAL_TABLET | ORAL | Status: DC | PRN

## 2024-03-30 MED ORDER — METOCLOPRAMIDE HCL 5 MG PO TABS
5.0000 mg | ORAL_TABLET | Freq: Three times a day (TID) | ORAL | Status: DC
  Administered 2024-03-30 (×2): 5 mg
  Filled 2024-03-30 (×3): qty 1

## 2024-03-30 MED ORDER — AMLODIPINE BESYLATE 10 MG PO TABS
10.0000 mg | ORAL_TABLET | Freq: Every day | ORAL | Status: DC
  Administered 2024-03-31: 10 mg via ORAL
  Filled 2024-03-30: qty 1

## 2024-03-30 MED ORDER — HYDRALAZINE HCL 50 MG PO TABS
75.0000 mg | ORAL_TABLET | Freq: Three times a day (TID) | ORAL | Status: DC
  Administered 2024-03-30 – 2024-03-31 (×5): 75 mg via ORAL
  Filled 2024-03-30 (×5): qty 1

## 2024-03-30 MED ORDER — CHLORHEXIDINE GLUCONATE CLOTH 2 % EX PADS: 6.0000 | MEDICATED_PAD | Freq: Every day | CUTANEOUS | Status: DC

## 2024-03-30 MED ORDER — PREDNISONE 5 MG PO TABS
15.0000 mg | ORAL_TABLET | Freq: Every day | ORAL | Status: DC
  Administered 2024-03-31: 15 mg via ORAL
  Filled 2024-03-30: qty 3

## 2024-03-30 MED ORDER — MYCOPHENOLATE 200 MG/ML ORAL SUSPENSION
500.0000 mg | Freq: Two times a day (BID) | ORAL | Status: DC
  Administered 2024-03-31 (×2): 500 mg via ORAL
  Filled 2024-03-30 (×3): qty 2.5

## 2024-03-30 MED ORDER — RENA-VITE PO TABS
1.0000 | ORAL_TABLET | Freq: Every day | ORAL | Status: DC
  Administered 2024-03-30 – 2024-03-31 (×2): 1 via ORAL
  Filled 2024-03-30 (×2): qty 1

## 2024-03-30 MED ORDER — METOCLOPRAMIDE HCL 5 MG PO TABS
5.0000 mg | ORAL_TABLET | Freq: Three times a day (TID) | ORAL | Status: DC
  Administered 2024-03-31 (×3): 5 mg via ORAL
  Filled 2024-03-30 (×4): qty 1

## 2024-03-30 MED ORDER — SIMETHICONE 40 MG/0.6ML PO SUSP: 40.0000 mg | Freq: Four times a day (QID) | ORAL | Status: DC | PRN

## 2024-03-30 MED ORDER — LOPERAMIDE HCL 1 MG/7.5ML PO SUSP: 2.0000 mg | ORAL | Status: DC | PRN

## 2024-03-30 MED ORDER — ACETAMINOPHEN 650 MG RE SUPP: 650.0000 mg | RECTAL | Status: DC | PRN

## 2024-03-30 MED ORDER — HYDRALAZINE HCL 50 MG PO TABS: 75.0000 mg | ORAL_TABLET | Freq: Three times a day (TID) | ORAL | Status: DC

## 2024-03-30 MED ORDER — METOPROLOL TARTRATE 50 MG PO TABS
50.0000 mg | ORAL_TABLET | Freq: Two times a day (BID) | ORAL | Status: DC
  Administered 2024-03-30 – 2024-03-31 (×3): 50 mg via ORAL
  Filled 2024-03-30 (×3): qty 1

## 2024-03-30 MED ORDER — ASPIRIN 81 MG PO CHEW
81.0000 mg | CHEWABLE_TABLET | Freq: Every day | ORAL | Status: DC
  Administered 2024-03-31: 81 mg via ORAL
  Filled 2024-03-30: qty 1

## 2024-03-30 NOTE — Progress Notes (Signed)
 [facility] Progress Note   Subjective:    K+ better w/ lokelma  , 4.6 today  Objective Vitals:   03/30/24 0103 03/30/24 0309 03/30/24 0816 03/30/24 1223  BP: (!) 171/49 (!) 173/71 (!) 175/70 (!) 151/54  Pulse: 85 92 87 90  Resp: 16 15 17  (!) 6  Temp:  97.9 F (36.6 C) 99.4 F (37.4 C) 99.6 F (37.6 C)  TempSrc:  Axillary Oral Oral  SpO2: 96% 91% 93% 94%  Weight:      Height:       Physical Exam General opens eyes and answers to voice  NG tube in  Lungs clear to auscultation bilaterally, on room air Heart S1S2 tachycardic no rub Abdomen soft nontender nondistended Extremities no edema  Access RUE AVF with bruit and thrill; aneurysmal region  Dialysis Orders: MWF AF 3h  B400   57.2kg  2K   AVF  Heparin  none - Hectoral 4mcg IV q HD - Mircera 150mcg IV q 2 weeks (last given 6/9 -> due 6/23)  Assessment/Plan: Intracerebral hemorrhage: C/b hydrocephalus, 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 TCDs with evidence of vasospasm 02/11/24. Followed closely by neurosurgery, evd removed 6/30 CVA: New CVA found on 7/1 ESRD: S/p CRRT 6/12-6/15, now MWF HD. Next HD Monday. Volume: no vol excess on exam. Note she is below her dry wt and only taking tube feeds. UF usually 1-2 L.  HTN: bp's under control, needs her BP lowering meds (norvasc , metoprolol , catapres , hydralazine ).  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 Fe ok  Secondary HPTH: Ca and Phos high, VDRA stopped 6/21, added sevelamer  via tube. 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 CyA level 7/09 was 109 (goal 100-200) which is in range. Dispo - per primary team.  Team states that plan likely for LTAC once more permanent enteric access obtained   Myer Fret  MD  [facility] 03/30/2024, 1:54 PM  Recent Labs  Lab 03/26/24 0552 03/26/24 1345 03/28/24 0619 03/29/24 0717  03/30/24 0614  HGB  --    < >  --  8.2* 7.2*  ALBUMIN 2.4*  --  2.3*  --   --   CALCIUM  10.1  --  10.6* 9.6 9.8  PHOS 4.5  --  4.0  --   --   CREATININE 6.29*  --  5.50* 5.99* 6.83*  K 4.1   < > 4.9 5.4* 4.6   < > = values in this interval not displayed.    Inpatient medications:  [START ON 03/31/2024] amLODipine   10 mg Oral Daily   [START ON 03/31/2024] aspirin   81 mg Oral Daily   Chlorhexidine  Gluconate Cloth  6 each Topical Q0600   cloNIDine   0.3 mg Transdermal Weekly   cycloSPORINE   200 mg Per Tube QHS   cycloSPORINE   225 mg Per Tube Daily   darbepoetin (ARANESP ) injection - DIALYSIS  100 mcg Subcutaneous Q Fri-1800   heparin  injection (subcutaneous)  5,000 Units Subcutaneous Q8H   hydrALAZINE   75 mg Oral Q8H   insulin  aspart  0-9 Units Subcutaneous Q4H   lacosamide   100 mg Oral BID   metoCLOPramide   5 mg Per Tube Q8H   metoprolol  tartrate  50 mg Oral BID   multivitamin  1 tablet Oral QHS   mycophenolate   500 mg Per Tube BID   [START ON 03/31/2024] predniSONE   15 mg  Oral Q breakfast   sevelamer  carbonate  2.4 g Per Tube TID WC   sodium chloride  flush  3 mL Intravenous Q12H    feeding supplement (NEPRO CARB STEADY) 1,000 mL (03/29/24 1610)   acetaminophen  **OR** acetaminophen , albuterol , artificial tears, atropine , butalbital -acetaminophen -caffeine , Gerhardt's butt cream, hydrALAZINE , HYDROmorphone  (DILAUDID ) injection, iohexol , labetalol , loperamide  HCl, naLOXone  (NARCAN )  injection, nitroGLYCERIN , ondansetron  (ZOFRAN ) IV, mouth rinse, oxyCODONE , prochlorperazine , simethicone 

## 2024-03-30 NOTE — Progress Notes (Signed)
 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 CT scan suggested colitis.  Patient's mentation continued to worsen. CT head was subsequently done which revealed intracranial hemorrhage.  Patient was admitted to the ICU for hypertensive ICH, cva, initially in ICU intubated. Palliative following for goals of care  G tube placed by IR but needed GJ, now placed postpyloric cortrack.  [facility].   Significant Hospital Events:  6/11 ED w n/v AMS. Abx IVF for colitis 6/12 Admit. AMS decr LOC. Incr O2 req. PCCM consult and code stroke called seen w rapid response nephro NP then neuro  6/13 Intubated with EVD, responding to commands  6/14 Extubated 6/17 acute L cerebellar infarct. Keppra  dc  6/18 Dialysis, fevering, EVD at 10 (129 mL last 24 hrs) 6/19 Narcotic dependence a driving factor. +Vasospasm on TCD. CTA with evolving Left cerebellar infarct, no HT or mass effect, substantially decreased caliber of multiple intracranial arteries and bilateral [place]-of[place] branches (especially: Distal left V4, supraclinoid ICAs, MCA and ACA origins), appearance c/w widespread Vasospasm, Vasoconstriction. No associated large vessel occlusion. 6/21 CSF sampline from EVD -- WBC 244 RBC 19000, cx ngtd  6/25: diagnostic angio today with nsgy - negative 6/26 evd leaking when clamped  6/30 evd removed, HD overnight, HTN improved, 7 days cefazolin  complete 7/1 more lethargic> minimal response to narcan  x2, oxy stopped, EEG neg, MRI - new small left parietal cva, cortrak placed postpyloric, vagal episodes with N/V 7/2 iHD, vimpat  started 7/9 she appears agitated.  IVs were removed.  LP to be  initiated to evaluate her opening pressure and to ensure that she does not have meningitis if this is still pending to be done. 7/10 LP done at bedside by Neurosurgery. Cortrak was removed yesterday evening and replaced by Fluoro team today.  7/11 Cortrak obstructed and reomved and had to be replaced by Flouro. Calorie Count initiated. Undergoing Dialysis today  7/12: Having chest discomfort. Palliative Care Consulted for GOC Discussion 7/14: Agreeable to J-tube with venting G-tube. IR consulted. SLP continues to recommend Thin (Full) Liquids 7/24- ltach denied and appeal initiated.    Subjective: Patient seen and examined Resting comfortably.  Her mother is at the bedside Tolerating tube feeding. Overnight BP 140s-180s.  On room air Labs this morning potassium improved to 4.6 hemoglobin at 7.2 downtrending  Assessment and plan:  Hypertensive SAH, ICH with brain compression, communicating hydrocephalus: Extensive hospitalization and consultation by neurology and neurosurgery and imaging. s/p right frontal pressure ventriculostomy via burr hole and EVD was removed on 6/30. MRI brain-small foci of hemorrhage in the posterior left cerebellum without evidence for acute infarct. Punctate focus of acute infarct involving the medial left occipital cortex, evolving subacute infarct in the left parietal lobe, trace blood products. Due to concern for hydrocephalus, neurosurgery did LP, spinal fluid pressures were low and ruled out meningitis. Per Dr. Mavis, she would not benefit from a VP shunt  Palliative medicine following for GOC  Episode of fever 7/25: At risk of aspiration infection.  No recurrence since.  X-ray showed mild vascular congestion 7/26-clinically no features of pneumonia  Oropharyngeal Dysphagia History of severe gastroparesis with nausea and vomiting Moderate malnutrition Deconditioning and generalized pain Chronic opiate use-PTA on Oxy 5/325 3  times daily per med  rec: G-tube placed by IR but needs GJ (after tract heals in about 4 weeks for jejunal feeds and gastric venting) Cont  postpyloric feeding w/ cortrak and FLD per SLP RD following continue tube feeding  Cont Reglan  5 mg IV q8hr prn antiemetics> changing to oral Cont pain management but cont to wean IV Dilaudid  and gradually change to oral regimen= patient's mother agrees  Patient will benefit from LTACH>she cannot go to SNF with the post pyloric NGT, will need GJ conversion in few weeks, can be done in LTACH),will need in depth PT/OT due to lengthy stay  and will need ongoing HD. Peer to peer review declined by insurance MD 7/24 and appeal in process. LTACH despite recommending she would highly benefit with LTAC level of care with extensive rehabilitation and ongoing nutritional augmentation with medical management   Concern for seizure activity post ICH: Neurology input appreciated EEG completed continue on Vimpat ,seizure precaution.     Vasospasm with left cerebellar infarct, on 03/04/24:  LDL 63.HbA1c 4.2. Will benefit in the [facility]ACH/[facility] facility given hemodialysis and ongoing significant medical issues. Neuro signed off.   ESRD on hemodialysis, MWF Hyperkalemia: Nephrology following for HD only 25 mins on 7/25.Cont home cellCept ,cyclosporine , and Prednisone   Received Lokelma  7/26 for hyperkalemia.  Potassium stable   Essential Hypertension: BP slowly improving. midodrine  has been discontinued in UICU. Continue clonidine  patch> increased to 0.3 on 7/25,cont  Norvasc  10, Lopressor  50 mg, Hydralazine  75 tid. Cont prn iv Optimize blood pressure due to #1    Chest Pain: Atypical, 2D echo showed EF normal 60 to 65%, with no regional WMA, mild LVH.Continue pain control as needed   Bradycardia:  Secondary to vagal response. Resolved.   Colitis Abdominal pain: Stable.FMS has been discontinued.    Status post Pancreatic Transplant Diabetes mellitus Blood sugar well-controlled, continue  sliding scale insulin  Recent Labs  Lab 03/29/24 1143 03/29/24 1536 03/29/24 2024 03/29/24 2338 03/30/24 0307  GLUCAP 158* 182* 126* 123* 114*     Anemia of Chronic Disease H&H stable.  Nutrition Problem: Moderate Malnutrition Etiology: chronic illness (ESRD on HD/DM) Signs/Symptoms: mild fat depletion, mild muscle depletion Interventions: Tube feeding   DVT prophylaxis: heparin  injection 5,000 Units Start: 03/14/24 0600 SCDs Start: 02/14/24 0725 Code Status:   Code Status: Full Code Family Communication: plan of care discussed with patient/family- mother updated bedside 7/26  Patient status is: Remains hospitalized because of severity of illness Level of care: Progressive   Dispo: The patient is from: home            Anticipated disposition: TBD.  Awaiting appeal on LTAC placement denial. Objective: Vitals last 24 hrs: Vitals:   03/29/24 2149 03/29/24 2343 03/30/24 0103 03/30/24 0309  BP: (!) 159/64 (!) 181/48 (!) 171/49 (!) 173/71  Pulse: 78 80 85 92  Resp: 14 18 16 15   Temp:  98.6 F (37 C)  97.9 F (36.6 C)  TempSrc:  Axillary  Axillary  SpO2: 91% 94% 96% 91%  Weight:      Height:       Physical Examination: General exam: Appears sleepy this morning has taken meds recently  HEENT:Oral mucosa moist, Ear/Nose WNL grossly Respiratory system: Bilaterally clear BS,no use of accessory muscle Cardiovascular system: S1 & S2 +, No JVD. Gastrointestinal system: Abdomen soft, ND, BS+ PEG+ cortrak+ Nervous System: Alert, awake, moving all extremities,and following commands. Extremities: RUE AVG+  Skin: No rashes,no icterus. MSK: weak muscle bulk,tone, power    Data Reviewed:  I have personally reviewed following labs and imaging studies ( see epic result tab) CBC: Recent Labs  Lab 03/24/24 0708 03/26/24 0551 03/27/24 0544 03/29/24 0717 03/30/24 0614  WBC 5.6 6.0 6.1 6.2 6.0  HGB 9.2* 8.6* 8.5* 8.2* 7.2*  HCT 27.4* 25.6* 25.6* 25.4* 21.8*  MCV 94.2 96.2 98.1 99.6  97.3  PLT 220 204 195 125* 163   CMP: Recent Labs  Lab 03/24/24 0708 03/24/24 1139 03/25/24 0610 03/25/24 0919 03/26/24 0552 03/26/24 1345 03/27/24 0544 03/27/24 0828 03/28/24 0619 03/29/24 0717 03/30/24 0614  NA 133*  --  129*  --  130*  --   --   --  137 131* 131*  K 3.3*   < > 3.5   < > 4.1   < > 4.2 4.2 4.9 5.4* 4.6  CL 90*  --  88*  --  86*  --   --   --  95* 96* 93*  CO2 27  --  30  --  31  --   --   --  28 22 25   GLUCOSE 117*  --  130*  --  112*  --   --   --  90 111* 129*  BUN 14  --  28*  --  41*  --   --   --  38* 47* 62*  CREATININE 3.65*  --  5.23*  --  6.29*  --   --   --  5.50* 5.99* 6.83*  CALCIUM  9.4  --  10.0  --  10.1  --   --   --  10.6* 9.6 9.8  PHOS 3.5  --  4.3  --  4.5  --   --   --  4.0  --   --    < > = values in this interval not displayed.   GFR: Estimated Creatinine Clearance: 8.7 mL/min (A) (by C-G formula based on SCr of 6.83 mg/dL (H)). Recent Labs  Lab 03/24/24 0708 03/25/24 0610 03/26/24 0552 03/28/24 0619  ALBUMIN 2.6* 2.4* 2.4* 2.3*   No results for input(s): LIPASE, AMYLASE in the last 168 hours. No results for input(s): AMMONIA in the last 168 hours. Coagulation Profile: No results for input(s): INR, PROTIME in the last 168 hours. Unresulted Labs (From admission, onward)     Start     Ordered   03/30/24 0500  Basic metabolic panel with GFR  Daily,   R     Question:  Specimen collection method  Answer:  Lab=Lab collect   03/29/24 0623   03/30/24 0500  CBC  Daily,   R     Question:  Specimen collection method  Answer:  Lab=Lab collect   03/29/24 0623   03/28/24 0000  Renal function panel  Every Mon-Wed-Fri,   R     Comments: With dialysis   Question:  Specimen collection method  Answer:  Lab=Lab collect   03/27/24 1315           Antimicrobials/Microbiology: Anti-infectives (From admission, onward)    Start     Dose/Rate Route Frequency Ordered Stop   03/20/24 0845  ceFAZolin  (ANCEF ) IVPB 2g/100 mL premix         over 30 Minutes Intravenous Continuous PRN 03/20/24 0859 03/20/24 0845   02/27/24 1200  vancomycin  (VANCOREADY) IVPB 500 mg/100 mL  Status:  Discontinued        500 mg 100 mL/hr over 60 Minutes Intravenous Every M-W-F (Hemodialysis) 02/25/24 1129 02/25/24 1436   02/26/24 2200  ceFAZolin  (ANCEF ) IVPB 1 g/50 mL premix        1 g 100 mL/hr over 30 Minutes Intravenous Every 24 hours 02/26/24 1009 03/03/24 2140   02/25/24 1530  vancomycin  (VANCOREADY) IVPB 500 mg/100 mL        500 mg 100 mL/hr over 60 Minutes Intravenous Every M-W-F (Hemodialysis) 02/25/24 1436 02/25/24 1934   02/23/24 1800  meropenem  (MERREM ) 1 g in sodium chloride  0.9 % 100 mL IVPB  Status:  Discontinued        1 g 200 mL/hr over 30 Minutes Intravenous Every 24 hours 02/23/24 1625 02/26/24 1009   02/23/24 1800  vancomycin  (VANCOREADY) IVPB 1250 mg/250 mL        1,250 mg 166.7 mL/hr over 90 Minutes Intravenous  Once 02/23/24 1625 02/23/24 1933   02/17/24 2200  piperacillin -tazobactam (ZOSYN ) IVPB 2.25 g        2.25 g 100 mL/hr over 30 Minutes Intravenous Every 8 hours 02/17/24 1412 02/21/24 2336   02/14/24 1800  vancomycin  (VANCOREADY) IVPB 2000 mg/400 mL        2,000 mg 200 mL/hr over 120 Minutes Intravenous  Once 02/14/24 1709 02/14/24 2048   02/14/24 1622  piperacillin -tazobactam (ZOSYN ) IVPB 3.375 g  Status:  Discontinued        3.375 g 100 mL/hr over 30 Minutes Intravenous Every 6 hours 02/14/24 1622 02/17/24 1412   02/14/24 1545  piperacillin -tazobactam (ZOSYN ) IVPB 3.375 g  Status:  Discontinued        3.375 g 12.5 mL/hr over 240 Minutes Intravenous Every 8 hours 02/14/24 1451 02/14/24 1453   02/14/24 1545  piperacillin -tazobactam (ZOSYN ) IVPB 2.25 g  Status:  Discontinued        2.25 g 100 mL/hr over 30 Minutes Intravenous Every 8 hours 02/14/24 1453 02/14/24 1622   02/14/24 0800  cefTRIAXone  (ROCEPHIN ) 2 g in sodium chloride  0.9 % 100 mL IVPB  Status:  Discontinued        2 g 200 mL/hr over 30 Minutes  Intravenous Every 24 hours 02/14/24 0730 02/14/24 1451   02/14/24 0800  metroNIDAZOLE  (FLAGYL ) IVPB 500 mg  Status:  Discontinued        500 mg 100 mL/hr over 60 Minutes Intravenous Every 12 hours 02/14/24 0730 02/14/24 1451         Component Value Date/Time   SDES BACK 03/13/2024 1040   SPECREQUEST NONE 03/13/2024 1040   CULT  03/13/2024 1040    NO GROWTH 3 DAYS Performed at [facility], [address] [place], [name] [address]    REPTSTATUS 03/16/2024 FINAL 03/13/2024 1040    Procedures:  Medications reviewed:  Scheduled Meds:  [START ON 03/31/2024] amLODipine   10 mg Oral Daily   [START ON 03/31/2024] aspirin   81 mg Oral Daily   Chlorhexidine  Gluconate Cloth  6 each Topical Q0600   cloNIDine   0.3 mg Transdermal Weekly   cycloSPORINE   200 mg Per Tube QHS   cycloSPORINE   225 mg Per Tube Daily   darbepoetin (ARANESP ) injection - DIALYSIS  100 mcg Subcutaneous Q Fri-1800   heparin  injection (subcutaneous)  5,000 Units Subcutaneous Q8H   hydrALAZINE   75 mg Oral Q8H   insulin  aspart  0-9 Units Subcutaneous Q4H   lacosamide   100 mg Oral BID   metoCLOPramide   5 mg Per Tube Q8H   metoprolol  tartrate  50 mg Oral BID   multivitamin  1 tablet Oral QHS   mycophenolate   500 mg Per Tube BID   [START ON  03/31/2024] predniSONE   15 mg Oral Q breakfast   sevelamer  carbonate  2.4 g Per Tube TID WC   sodium chloride  flush  3 mL Intravenous Q12H   Continuous Infusions:  feeding supplement (NEPRO CARB STEADY) 1,000 mL (03/29/24 1610)    Mennie LAMY, MD [name] 03/30/2024, 9:49 AM

## 2024-03-31 LAB — CBC
HCT: 24.3 % — ABNORMAL LOW (ref 36.0–46.0)
Hemoglobin: 8.2 g/dL — ABNORMAL LOW (ref 12.0–15.0)
MCH: 32 pg (ref 26.0–34.0)
MCHC: 33.7 g/dL (ref 30.0–36.0)
MCV: 94.9 fL (ref 80.0–100.0)
Platelets: 169 K/uL (ref 150–400)
RBC: 2.56 MIL/uL — ABNORMAL LOW (ref 3.87–5.11)
RDW: 14.4 % (ref 11.5–15.5)
WBC: 5.4 K/uL (ref 4.0–10.5)
nRBC: 0 % (ref 0.0–0.2)

## 2024-03-31 LAB — GLUCOSE, CAPILLARY
Glucose-Capillary: 119 mg/dL — ABNORMAL HIGH (ref 70–99)
Glucose-Capillary: 115 mg/dL — ABNORMAL HIGH (ref 70–99)
Glucose-Capillary: 154 mg/dL — ABNORMAL HIGH (ref 70–99)
Glucose-Capillary: 177 mg/dL — ABNORMAL HIGH (ref 70–99)
Glucose-Capillary: 147 mg/dL — ABNORMAL HIGH (ref 70–99)

## 2024-03-31 MED ORDER — SIMETHICONE 40 MG/0.6ML PO SUSP: 40.0000 mg | Freq: Four times a day (QID) | ORAL | Status: DC | PRN

## 2024-03-31 MED ORDER — CLONIDINE 0.3 MG/24HR TD PTWK: 0.3000 mg | MEDICATED_PATCH | TRANSDERMAL | Status: DC

## 2024-03-31 MED ORDER — SEVELAMER CARBONATE 2.4 G PO PACK: 2.4000 g | PACK | Freq: Three times a day (TID) | ORAL | Status: DC

## 2024-03-31 MED ORDER — POLYVINYL ALCOHOL 1.4 % OP SOLN: 1.0000 [drp] | OPHTHALMIC | Status: AC | PRN

## 2024-03-31 MED ORDER — RENA-VITE PO TABS: 1.0000 | ORAL_TABLET | Freq: Every day | ORAL | Status: DC

## 2024-03-31 MED ORDER — GERHARDT'S BUTT CREAM: 1.0000 | TOPICAL_CREAM | CUTANEOUS | Status: DC | PRN

## 2024-03-31 MED ORDER — LACOSAMIDE 100 MG PO TABS: 100.0000 mg | ORAL_TABLET | Freq: Two times a day (BID) | ORAL | Status: DC

## 2024-03-31 MED ORDER — MYCOPHENOLATE 200 MG/ML ORAL SUSPENSION: 500.0000 mg | Freq: Two times a day (BID) | ORAL | Status: DC

## 2024-03-31 MED ORDER — NEPRO/CARBSTEADY PO LIQD: 1000.0000 mL | ORAL | Status: DC

## 2024-03-31 MED ORDER — METOPROLOL TARTRATE 50 MG PO TABS: 50.0000 mg | ORAL_TABLET | Freq: Two times a day (BID) | ORAL | Status: DC

## 2024-03-31 MED ORDER — ALBUTEROL SULFATE (2.5 MG/3ML) 0.083% IN NEBU: 2.5000 mg | INHALATION_SOLUTION | RESPIRATORY_TRACT | Status: DC | PRN

## 2024-03-31 NOTE — Progress Notes (Signed)
 [facility] but cannot go until after dialysis

## 2024-03-31 NOTE — Progress Notes (Signed)
 Physical Therapy Treatment Patient Details Name: [name] [name] MRN: [id number] DOB: 1981-05-31 Today's Date: 03/31/2024   History of Present Illness 43 yo female who presented to the ED 6/11 with n/v, abd pain after HD. 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 CRRT 6/13- 6/15. Cerebral angiogram 6/25 shows no intracranial aneurysms, AVM, or fistula seen to explain the patient's subarachnoid hemorrhage, diffuse mild-moderate anterior circulation vasospasm. EVD 6/12-6/30.  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 LP 7/10- negative. 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    PT Comments  Pt alert on arrival. Moaning in pain with mother reporting she has seemed to have more pain in her abd today. Mother able to persuade pt to participate (especially once we realized pt would be having dialysis in ~20 minutes and informed pt she would not have to get up to chair). Remains mod assist for bed mobility; CGA to min assist for sitting balance, +2 mod assist sit to stand x2. Returned to sidelying with min assist with pt lifting her legs onto the bed without assist. Patient began gagging with HOB elevated and pt then vomited small amount of green emesis onto washcloth. Mouth suctioned with nothing present. HOB elevated 40 degrees with bed alarm on at end of session.     If plan is discharge home, recommend the following: Assistance with cooking/housework;Assistance with feeding;Direct supervision/assist for medications management;Direct supervision/assist for financial management;Assist for transportation;Help with stairs or ramp for entrance;Supervision due to cognitive status;A lot of help with bathing/dressing/bathroom;Two people to help with walking and/or transfers   Can travel by private vehicle     No  Equipment Recommendations  Wheelchair  (measurements PT);Wheelchair cushion (measurements PT)    Recommendations for Other Services       Precautions / Restrictions Precautions Precautions: Fall Recall of Precautions/Restrictions: Impaired Precaution/Restrictions Comments: cortrak, PEG Restrictions Weight Bearing Restrictions Per Provider Order: No     Mobility  Bed Mobility Overal bed mobility: Needs Assistance Bed Mobility: Rolling, Sidelying to Sit Rolling: Min assist Sidelying to sit: Mod assist     Sit to sidelying: HOB elevated, Min assist, Used rails General bed mobility comments: Cues and gave extra time for pt to initiate and partitipate with rolling and transition to sitting, but still needed mod assist.  Return to side she required guiding assist for upper body and lifted her legs onto bed without assist.    Transfers Overall transfer level: Needs assistance Equipment used: 2 person hand held assist Transfers: Sit to/from Stand Sit to Stand: Mod assist, +2 physical assistance           General transfer comment: stood with use of pad +2 mod assist x2. Attempted without pad under pelvis with pt unable to stand. Deferred OOB to chair due to dialysis coming for pt in ~20 minutes.    Ambulation/Gait             Pre-gait activities: side steps along EOB to her left; assist to advance RLE     Stairs             Wheelchair Mobility     Tilt Bed    Modified Rankin (Stroke Patients Only) Modified Rankin (Stroke Patients Only) Pre-Morbid Rankin Score: No symptoms Modified Rankin: Moderately severe disability     Balance Overall balance assessment: Needs assistance Sitting-balance support: Feet supported, Bilateral upper extremity supported Sitting balance-Leahy Scale:  Poor Sitting balance - Comments: pt collapsing into forward flexion   Standing balance support: During functional activity, Single extremity supported Standing balance-Leahy Scale: Poor Standing balance comment:  only tolerated standing for ~10 seconds with forward flexed posture                            Communication Communication Communication: Impaired Factors Affecting Communication: Reduced clarity of speech  Cognition Arousal: Alert Behavior During Therapy: Anxious   PT - Cognitive impairments: Attention, Awareness, Problem solving, Initiation                         Following commands: Impaired Following commands impaired: Follows one step commands with increased time    Cueing Cueing Techniques: Verbal cues, Gestural cues, Tactile cues  Exercises      General Comments General comments (skin integrity, edema, etc.): Mother present and very encouraging      Pertinent Vitals/Pain Pain Assessment Pain Assessment: Faces Faces Pain Scale: Hurts whole lot Pain Location: abdomen Pain Descriptors / Indicators: Discomfort, Guarding, Grimacing, Moaning Pain Intervention(s): Limited activity within patient's tolerance, Monitored during session, Repositioned    Home Living                          Prior Function            PT Goals (current goals can now be found in the care plan section) Acute Rehab PT Goals Patient Stated Goal: to rest Time For Goal Achievement: 04/03/24 Potential to Achieve Goals: Poor Progress towards PT goals: Not progressing toward goals - comment (abd pain and N/V)    Frequency    Min 1X/week      PT Plan      Co-evaluation              AM-PAC PT 6 Clicks Mobility   Outcome Measure  Help needed turning from your back to your side while in a flat bed without using bedrails?: A Lot Help needed moving from lying on your back to sitting on the side of a flat bed without using bedrails?: A Lot Help needed moving to and from a bed to a chair (including a wheelchair)?: Total Help needed standing up from a chair using your arms (e.g., wheelchair or bedside chair)?: Total Help needed to walk in hospital room?:  Total Help needed climbing 3-5 steps with a railing? : Total 6 Click Score: 8    End of Session   Activity Tolerance: Patient limited by pain;Treatment limited secondary to medical complications (Comment) (vomiting) Patient left: with call bell/phone within reach;with family/visitor present;in bed;with bed alarm set Nurse Communication: Other (comment) (vomited dark green emesis) PT Visit Diagnosis: Muscle weakness (generalized) (M62.81);Other symptoms and signs involving the nervous system (R29.898) Hemiplegia - Right/Left: Left Hemiplegia - dominant/non-dominant: Non-dominant Hemiplegia - caused by: Other Nontraumatic intracranial hemorrhage     Time: 1400-1418 PT Time Calculation (min) (ACUTE ONLY): 18 min  Charges:    $Therapeutic Activity: 8-22 mins PT General Charges $$ ACUTE PT VISIT: 1 Visit                      Macario RAMAN, PT Acute Rehabilitation Services  Office [phone]    Macario SHAUNNA Soja 03/31/2024, 2:50 PM

## 2024-03-31 NOTE — TOC Transition Note (Signed)
 Transition of Care (TOC) - Discharge Note Rayfield Gobble RN,BSN Inpatient Care Management Unit 4NP (Non Trauma)- RN Case Manager See Treatment Team for direct Phone #   Patient Details  Name: [name] [name] MRN: [id number] Date of Birth: 07-28-1981  Transition of Care [facility]) CM/SW Contact:  Gobble Rayfield Hurst, RN Phone Number: 03/31/2024, 11:28 AM   Clinical Narrative:    ICM team has been notified by [facility] liaison this am that pt has won appeal with insurance for [facility] admission.  [facility] has bed to offer for admit today and liaison has confirmed they can admit today.   Room #- [id number] Admitting MD- Dr. Delores  Report number for MD/RN- [phone]  Pt will transition to [facility] today, unit staff to transport to [facility] via bed.   RNCM will sign off for now as intervention is no longer needed. Please re-consult  if new needs arise, or contact RNCM assigned to treatment team for further questions/concerns.     Final next level of care: Long Term Acute Care (LTAC) Barriers to Discharge: Barriers Resolved   Patient Goals and CMS Choice Patient states their goals for this hospitalization and ongoing recovery are:: rehab and get better   Choice offered to / list presented to : Patient      Discharge Placement                 [facility]      Discharge Plan and Services Additional resources added to the After Visit Summary for   In-house Referral: Clinical Social Work Discharge Planning Services: CM Consult Post Acute Care Choice: Skilled Nursing Facility, Long Term Acute Care (LTAC)            DME Agency: NA       HH Arranged: NA HH Agency: NA        Social Drivers of Health (SDOH) Interventions SDOH Screenings   Food Insecurity: Patient Unable To Answer (02/15/2024)  Housing: Low Risk  (03/05/2024)  Transportation Needs: Patient Unable To Answer (02/15/2024)  Utilities: Patient Unable To Answer (02/15/2024)  Social Connections: Unknown (10/04/2022)    Received from [facility]  Tobacco Use: Medium Risk (03/20/2024)     Readmission Risk Interventions    03/31/2024   11:27 AM 11/17/2021   12:15 PM  Readmission Risk Prevention Plan  Transportation Screening Complete Complete  Medication Review (RN Care Manager) Complete Complete  PCP or Specialist appointment within 3-5 days of discharge  Complete  HRI or Home Care Consult Complete Complete  SW Recovery Care/Counseling Consult Complete Complete  Palliative Care Screening Not Applicable Not Applicable  Skilled Nursing Facility Not Applicable Patient Refused

## 2024-03-31 NOTE — Progress Notes (Signed)
 [facility] Progress Note   Subjective:    Seen and examined patient at bedside. She's c/o ABD pain and wants medication for pain. Plan for dc this afternoon. Noted dc order. Appears she will be discharged to a LTACH.  Objective Vitals:   03/30/24 2356 03/31/24 0311 03/31/24 0712 03/31/24 0858  BP: (!) 163/52 (!) 149/60  (!) 178/66  Pulse: 81 87  (!) 101  Resp: 14 13  20   Temp:  97.6 F (36.4 C)  99.3 F (37.4 C)  TempSrc:  Oral  Oral  SpO2: 95% 95%  97%  Weight:   57.2 kg   Height:       Physical Exam General: Opens eyes and answers simple questions Heart: S1 and S2; No murmurs, gallops, or rubs Lungs: Clear anteriorly Abdomen: Round, unable to palpate 2nd current ABD pain Extremities: No LE edema Dialysis Access: R AVF (+) B/T   Filed Weights   03/28/24 0500 03/28/24 0854 03/31/24 0712  Weight: 52.2 kg 51.3 kg 57.2 kg    Intake/Output Summary (Last 24 hours) at 03/31/2024 1125 Last data filed at 03/30/2024 1500 Gross per 24 hour  Intake 550 ml  Output --  Net 550 ml    Additional Objective Labs: Basic Metabolic Panel: Recent Labs  Lab 03/25/24 0610 03/25/24 0919 03/26/24 0552 03/26/24 1345 03/28/24 0619 03/29/24 0717 03/30/24 0614  NA 129*  --  130*  --  137 131* 131*  K 3.5   < > 4.1   < > 4.9 5.4* 4.6  CL 88*  --  86*  --  95* 96* 93*  CO2 30  --  31  --  28 22 25   GLUCOSE 130*  --  112*  --  90 111* 129*  BUN 28*  --  41*  --  38* 47* 62*  CREATININE 5.23*  --  6.29*  --  5.50* 5.99* 6.83*  CALCIUM  10.0  --  10.1  --  10.6* 9.6 9.8  PHOS 4.3  --  4.5  --  4.0  --   --    < > = values in this interval not displayed.   Liver Function Tests: Recent Labs  Lab 03/25/24 0610 03/26/24 0552 03/28/24 0619  ALBUMIN 2.4* 2.4* 2.3*   No results for input(s): LIPASE, AMYLASE in the last 168 hours. CBC: Recent Labs  Lab 03/26/24 0551 03/27/24 0544 03/29/24 0717 03/30/24 0614 03/31/24 0721  WBC 6.0 6.1 6.2 6.0 5.4  HGB 8.6*  8.5* 8.2* 7.2* 8.2*  HCT 25.6* 25.6* 25.4* 21.8* 24.3*  MCV 96.2 98.1 99.6 97.3 94.9  PLT 204 195 125* 163 169   Blood Culture    Component Value Date/Time   SDES BACK 03/13/2024 1040   SPECREQUEST NONE 03/13/2024 1040   CULT  03/13/2024 1040    NO GROWTH 3 DAYS Performed at [facility], [address], [place], [name] [address]    REPTSTATUS 03/16/2024 FINAL 03/13/2024 1040    Cardiac Enzymes: No results for input(s): CKTOTAL, CKMB, CKMBINDEX, TROPONINI in the last 168 hours. CBG: Recent Labs  Lab 03/30/24 1620 03/30/24 1953 03/30/24 2309 03/31/24 0314 03/31/24 0754  GLUCAP 170* 134* 123* 119* 115*   Iron  Studies: No results for input(s): IRON , TIBC, TRANSFERRIN, FERRITIN in the last 72 hours. Lab Results  Component Value Date   INR 1.3 (H) 02/20/2024   INR 1.3 (H) 02/14/2024   INR 1.2 06/13/2023   Studies/Results: No results found.  Medications:  feeding supplement (  NEPRO CARB STEADY) 1,000 mL (03/30/24 1757)    amLODipine   10 mg Oral Daily   aspirin   81 mg Oral Daily   Chlorhexidine  Gluconate Cloth  6 each Topical Q0600   Chlorhexidine  Gluconate Cloth  6 each Topical Q0600   cloNIDine   0.3 mg Transdermal Weekly   cycloSPORINE   200 mg Oral QHS   cycloSPORINE   225 mg Oral Daily   darbepoetin (ARANESP ) injection - DIALYSIS  100 mcg Subcutaneous Q Fri-1800   heparin  injection (subcutaneous)  5,000 Units Subcutaneous Q8H   hydrALAZINE   75 mg Oral Q8H   insulin  aspart  0-9 Units Subcutaneous Q4H   lacosamide   100 mg Oral BID   metoCLOPramide   5 mg Oral Q8H   metoprolol  tartrate  50 mg Oral BID   multivitamin  1 tablet Oral QHS   mycophenolate   500 mg Oral BID   predniSONE   15 mg Oral Q breakfast   sevelamer  carbonate  2.4 g Oral TID WC   sodium chloride  flush  3 mL Intravenous Q12H    Dialysis Orders: MWF AF 3h  B400   57.2kg  2K   AVF  Heparin  none - Hectoral 4mcg IV q HD - Mircera 150mcg IV q 2 weeks (last given 6/9 ->  due 6/23)  Assessment/Plan: Intracerebral hemorrhage: C/b hydrocephalus, 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 TCDs with evidence of vasospasm 02/11/24. Followed closely by neurosurgery, evd removed 6/30 CVA: New CVA found on 7/1 ESRD: S/p CRRT 6/12-6/15, now MWF HD. Next HD this afternoon. Volume: no vol excess on exam. Note she is below her dry wt and only taking tube feeds. UF usually 1-2 L.  HTN: bp's under control, needs her BP lowering meds (norvasc , metoprolol , catapres , hydralazine ).  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 Fe ok  Secondary HPTH: Ca and Phos high, VDRA stopped 6/21, added sevelamer  via tube. 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 CyA level 7/09 was 109 (goal 100-200) which is in range. Dispo - per primary team.  Team states that plan likely for LTAC once more permanent enteric access obtained. Discussed with our Renal Navigator and it appears patient is going to [facility]. Renal Navigator reached out to case manager to confirm.   [name], NP [facility] 03/31/2024,11:25 AM  LOS: 46 days

## 2024-03-31 NOTE — Discharge Summary (Addendum)
 Physician Discharge Summary  Jamariah Tony Regnier FMW:[id number] DOB: Apr 23, 1981 DOA: 02/13/2024  PCP: [facility]  Admit date: 02/13/2024 Discharge date: 03/31/2024 Recommendations for Outpatient Follow-up:  Follow up with PCP in 1 weeks-call for appointment Please obtain BMP/CBC in one week Follow-up with nephrology for dialysis 3 times a week Follow-up with IR for GJ placement for postpyloric feeding  Discharge Dispo: LTACH Discharge Condition: Stable Code Status:   Code Status: Full Code Diet recommendation:  Diet Order             Diet full liquid Room service appropriate? 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 CT scan suggested colitis.  Patient's mentation continued to worsen. CT head was subsequently done which revealed intracranial hemorrhage.  Patient was admitted to the ICU for hypertensive ICH, cva, initially in ICU intubated. Palliative following for goals of care  G tube placed by IR but needed GJ, now placed postpyloric cortrack.  Pursuing LTACH/select> will follow gradual decline and has been overtone after appeal Patient will be discharged to [facility] once approved  Significant Hospital Events:  6/11 ED w n/v AMS. Abx IVF for colitis 6/12 Admit. AMS decr LOC. Incr O2 req. PCCM consult and code stroke called seen w rapid response nephro NP then neuro  6/13 Intubated with EVD, responding to commands  6/14 Extubated 6/17 acute L cerebellar infarct. Keppra  dc  6/18 Dialysis, fevering, EVD at 10 (129 mL last 24 hrs) 6/19 Narcotic dependence a driving factor. +Vasospasm on TCD. CTA with evolving Left cerebellar infarct, no HT or mass effect, substantially decreased  caliber of multiple intracranial arteries and bilateral Circle-of-Willis branches (especially: Distal left V4, supraclinoid ICAs, MCA and ACA origins), appearance c/w widespread Vasospasm, Vasoconstriction. No associated large vessel occlusion. 6/21 CSF sampline from EVD -- WBC 244 RBC 19000, cx ngtd  6/25: diagnostic angio today with nsgy - negative 6/26 evd leaking when clamped  6/30 evd removed, HD overnight, HTN improved, 7 days cefazolin  complete 7/1 more lethargic> minimal response to narcan  x2, oxy stopped, EEG neg, MRI - new small left parietal cva, cortrak placed postpyloric, vagal episodes with N/V 7/2 iHD, vimpat  started 7/9 she appears agitated.  IVs were removed.  LP to be initiated to evaluate her opening pressure and to ensure that she does not have meningitis if this is still pending to be done. 7/10 LP done at bedside by Neurosurgery. Cortrak was removed yesterday evening and replaced by Fluoro team today.  7/11 Cortrak obstructed and reomved and had to be replaced by Flouro. Calorie Count initiated. Undergoing Dialysis today  7/12: Having chest discomfort. Palliative Care Consulted for GOC Discussion 7/14: Agreeable to J-tube with venting G-tube. IR consulted. SLP continues to recommend Thin (Full) Liquids 7/24- ltach denied and appeal initiated  7/28: Approved for LTAC.    Subjective: Patient seen and examined Mother at the bedside.  Patient resting comfortably.  She has no complaint Overnight afebrile BP remains fairly controlled blood sugar stable  Labs pending with BMP, CBC remained stable HB UP AT 8.2  Discharge diagnosis assessment and plan:  Hypertensive SAH, ICH with brain compression, communicating hydrocephalus: Extensive hospitalization , seen  by neurology/neurosurgery s/p right frontal pressure ventriculostomy via burr hole and EVD was removed on 6/30. MRI brain-small foci of hemorrhage in the posterior left cerebellum without evidence for acute infarct.  Punctate focus of acute infarct involving the medial left occipital cortex, evolving subacute infarct in the left parietal lobe, trace blood products. Due to concern for hydrocephalus, neurosurgery did LP, spinal fluid pressures were low and ruled out meningitis. Per Dr. Mavis, she would not benefit from a VP shunt  Palliative medicine following for GOC.  Episode of fever 7/25: At risk of aspiration.No recurrence of fever.X-ray showed mild vascular congestion 7/26-clinically no features of pneumonia  Oropharyngeal Dysphagia History of severe gastroparesis with nausea and vomiting Moderate malnutrition Deconditioning and generalized pain Chronic opiate use-PTA on Oxy 5/325 3 times daily per med rec: G-tube placed by IR but needs GJ (after tract heals in about 4 weeks for jejunal feeds and gastric venting) Cont  postpyloric feeding w/ cortrak and FLD per SLP.RD following continue tube feeding.  Cont Reglan  w/ cortrak cont pain management and wean iv opiates to po opiates/non opaites as able-and continue multimodal pain management. Patient will benefit from LTACH>she cannot go to SNF with the post pyloric NGT, will need GJ conversion in few weeks, can be done in LTACH),will need in depth PT/OT due to lengthy stay  and will need ongoing HD. Peer to peer review declined by insurance MD 7/24 and appeal in process. LTACH despite recommending she would highly benefit with LTAC level of care with extensive rehabilitation and ongoing nutritional augmentation with medical management  LTAC has been approved 7/28  Concern for seizure activity post ICH: Neurology input appreciated EEG completed continue on Vimpat ,seizure precaution.     Vasospasm with left cerebellar infarct, on 03/04/24:  LDL 63.HbA1c 4.2. Will benefit in the LTACH/select facility given hemodialysis and ongoing significant medical issues. Neuro signed off.   ESRD on hemodialysis, MWF Hyperkalemia Failed renal txp: Nephrology  following for HD only 25 mins on 7/25.Cont home cellCept ,cyclosporine , and Prednisone   Received Lokelma  7/26 for hyperkalemia.  Potassium stable.  Due for HD today   Essential Hypertension: BP fairly controlled.midodrine  has been discontinued in ICU. Continue clonidine  patch> increased to 0.3 on 7/25,cont  Norvasc  10, Lopressor  50 mg, Hydralazine  75 tid. Cont prn iv Optimize blood pressure due to #1    Chest Pain: Atypical, 2D echo showed EF normal 60 to 65%, with no regional WMA, mild LVH.Continue pain control as needed   Bradycardia:  Secondary to vagal response. Resolved.   Colitis Abdominal pain: Stable.FMS has been discontinued.    Status post Pancreatic Transplant Diabetes mellitus Blood sugar well-controlled, continue sliding scale insulin . on mycophenolate , cyclosporine  and pred 15 Recent Labs  Lab 03/30/24 1620 03/30/24 1953 03/30/24 2309 03/31/24 0314 03/31/24 0754  GLUCAP 170* 134* 123* 119* 115*     Anemia of Chronic Disease H&H stable.  Nutrition Problem: Moderate Malnutrition Etiology: chronic illness (ESRD on HD/DM) Signs/Symptoms: mild fat depletion, mild muscle depletion Interventions: Tube feeding   DVT prophylaxis: heparin  injection 5,000 Units Start: 03/14/24 0600 SCDs Start: 02/14/24 0725 Code Status:   Code Status: Full Code Family Communication: plan of care discussed with patient/family- mother updated bedside  Patient status is: Remains hospitalized because of severity of illness Level of care: Progressive   Dispo: The patient is from: home.            Anticipated disposition: LTACH today Objective: Vitals last 24 hrs: Vitals:   03/30/24 2356 03/31/24 9688 03/31/24 9287  03/31/24 0858  BP: (!) 163/52 (!) 149/60  (!) 178/66  Pulse: 81 87  (!) 101  Resp: 14 13  20   Temp:  97.6 F (36.4 C)  99.3 F (37.4 C)  TempSrc:  Oral  Oral  SpO2: 95% 95%  97%  Weight:   57.2 kg   Height:       Physical Examination: General exam: alert awake,  oriented pleasant.  Core Trak present HEENT:Oral mucosa moist, Ear/Nose WNL grossly Respiratory system: Bilaterally clear BS,no use of accessory muscle Cardiovascular system: S1 & S2 +, No JVD. Gastrointestinal system: Abdomen soft, ND, peg+ Nervous System: Alert, awake, moving all extremities,and following commands. Extremities: RUE AVG+  Skin: No rashes,no icterus. MSK: weak muscle bulk,tone, power    Discharge Instructions  Discharge Instructions     Ambulatory referral to Neurology   Complete by: As directed    Follow up with Dr. Rosemarie at [facility] in 4-6 weeks. Too complicated for RN to follow. Thanks.   Increase activity slowly   Complete by: As directed       Allergies as of 03/31/2024   No Known Allergies      Medication List     STOP taking these medications    cloNIDine  0.2 mg/24hr patch Commonly known as: CATAPRES  - Dosed in mg/24 hr Replaced by: cloNIDine  0.3 mg/24hr patch   Lokelma  10 g Pack packet Generic drug: sodium zirconium cyclosilicate    losartan  100 MG tablet Commonly known as: Cozaar    Mycophenolic Acid  180 MG Tbec       TAKE these medications    albuterol  (2.5 MG/3ML) 0.083% nebulizer solution Commonly known as: PROVENTIL  Take 3 mLs (2.5 mg total) by nebulization every 4 (four) hours as needed for wheezing or shortness of breath.   amLODipine  10 MG tablet Commonly known as: NORVASC  Take 1 tablet (10 mg total) by mouth daily.   artificial tears ophthalmic solution Place 1 drop into the left eye as needed for dry eyes.   aspirin  EC 81 MG tablet Chew 81 mg by mouth daily.   Auryxia  1 GM 210 MG(Fe) tablet Generic drug: ferric citrate  Take 210-630 mg by mouth See admin instructions. Take 3 tablets (630 mg) by mouth three times daily with meals, and take 1 tablet (210 mg) twice daily with snacks   busPIRone  10 MG tablet Commonly known as: BUSPAR  Take 10 mg by mouth 2 (two) times daily.   cinacalcet  30 MG tablet Commonly known as:  SENSIPAR  Take 3 tablets (90 mg total) by mouth daily with supper.   cloNIDine  0.3 mg/24hr patch Commonly known as: CATAPRES  - Dosed in mg/24 hr Place 1 patch (0.3 mg total) onto the skin once a week. Start taking on: April 04, 2024 Replaces: cloNIDine  0.2 mg/24hr patch   cycloSPORINE  25 MG capsule Commonly known as: SANDIMMUNE  Take 25 mg by mouth in the morning.   cycloSPORINE  modified 100 MG capsule Commonly known as: NEORAL  Take 2 capsules (200 mg total) by mouth 2 (two) times daily.   Darbepoetin Alfa  60 MCG/0.3ML Sosy injection Commonly known as: ARANESP  Inject 0.3 mLs (60 mcg total) into the vein every Friday with hemodialysis.   doxercalciferol  4 MCG/2ML injection Commonly known as: HECTOROL  Inject 3 mLs (6 mcg total) into the vein every Monday, Wednesday, and Friday with hemodialysis.   DULoxetine  20 MG capsule Commonly known as: CYMBALTA  Take 1 capsule (20 mg total) by mouth daily.   feeding supplement (NEPRO CARB STEADY) Liqd Place 1,000 mLs into feeding tube continuous.  gabapentin  100 MG capsule Commonly known as: NEURONTIN  Take 100 mg by mouth 2 (two) times daily.   Gerhardt's butt cream Crea Apply 1 Application topically as needed for irritation.   hydrALAZINE  25 MG tablet Commonly known as: APRESOLINE  Take 3 tablets (75 mg total) by mouth every 8 (eight) hours.   hydrOXYzine  25 MG capsule Commonly known as: VISTARIL  Take 25 mg by mouth 3 (three) times daily as needed for anxiety.   Lacosamide  100 MG Tabs Take 1 tablet (100 mg total) by mouth 2 (two) times daily.   lidocaine -prilocaine  cream Commonly known as: EMLA  Apply 1 application  topically daily as needed (prior to port access).   MELATONIN PO Take 1 tablet by mouth at bedtime.   metoCLOPramide  5 MG tablet Commonly known as: REGLAN  Take 5 mg by mouth 2 (two) times daily before a meal.   metoprolol  tartrate 50 MG tablet Commonly known as: LOPRESSOR  Take 1 tablet (50 mg total) by mouth  2 (two) times daily.   multivitamin Tabs tablet Take 1 tablet by mouth at bedtime.   mycophenolate  200 mg/mL oral suspension Commonly known as: CELLCEPT  Take 2.5 mLs (500 mg total) by mouth 2 (two) times daily.   ondansetron  4 MG tablet Commonly known as: ZOFRAN  Take 4 mg by mouth every 8 (eight) hours as needed for nausea or vomiting.   oxyCODONE -acetaminophen  5-325 MG tablet Commonly known as: PERCOCET/ROXICET Take 1 tablet by mouth 3 (three) times daily.   predniSONE  5 MG tablet Commonly known as: DELTASONE  Take 15 mg by mouth daily.   sevelamer  carbonate 2.4 g Pack Commonly known as: RENVELA  Take 2.4 g by mouth 3 (three) times daily with meals.   simethicone  40 MG/0.6ML drops Commonly known as: MYLICON Take 0.6 mLs (40 mg total) by mouth 4 (four) times daily as needed for flatulence.   Xphozah 30 MG Tabs Generic drug: Tenapanor HCl (CKD) Take 1 tablet by mouth 2 (two) times daily.   zolpidem  10 MG tablet Commonly known as: AMBIEN  Take 5 mg by mouth at bedtime as needed for sleep.        Follow-up Information     Rosemarie Eather RAMAN, MD. Schedule an appointment as soon as possible for a visit in 1 month(s).   Specialties: Neurology, Radiology Why: stroke clinic Contact information: [address] [name] [address] [phone]         [facility] Follow up in 1 week(s).   Contact information: [address] [name] [address] [phone]                No Known Allergies  The results of significant diagnostics from this hospitalization (including imaging, microbiology, ancillary and laboratory) are listed below for reference.    Microbiology: No results found for this or any previous visit (from the past 240 hours).  Procedures/Studies: DG Chest Port 1 View Result Date: 03/29/2024 CLINICAL DATA:  Fever. EXAM: PORTABLE CHEST 1 VIEW COMPARISON:  Chest radiograph dated 02/21/2024. FINDINGS: Enteric tube extends  below the diaphragm with tip beyond the inferior margin of the image. There is mild cardiomegaly. Bilateral interstitial densities may represent vascular congestion. Pneumonia is not excluded. No consolidative changes. There is no pleural effusion or pneumothorax. No acute osseous pathology. A 2 cm calcified nodule in the right breast as seen previously. Correlation with physical exam recommended. IMPRESSION: Mild cardiomegaly with mild vascular congestion. Pneumonia is not excluded. Electronically Signed   By: Vanetta Chou M.D.   On: 03/29/2024  10:13   DG Abd 1 View Result Date: 03/28/2024 EXAM: 1 VIEW XRAY OF THE ABDOMEN 03/28/2024 08:18:40 AM COMPARISON: None available. CLINICAL HISTORY: [id number] Encounter for care related to feeding tube [id number]. 1 min 48 sec fluoro 8.3 MGY feeding tube adjusted by ede. FINDINGS: BOWEL: Nonobstructive bowel gas pattern. Contrast injected is contained distal duodenum and proximal jejunum. No evidence for leak is present. SOFT TISSUES: No abnormal calcifications or opaque urinary calculi. BONES: No acute osseous abnormality. IMPRESSION: 1. No evidence of leak. Electronically signed by: Lonni Necessary MD 03/28/2024 08:25 AM EDT RP Workstation: [id number]   DG Abd Portable 1V Result Date: 03/27/2024 CLINICAL DATA:  Nasogastric tube placement. EXAM: PORTABLE ABDOMEN - 1 VIEW COMPARISON:  None Available. FINDINGS: Distal tip of enteric tube is seen in expected position of distal stomach. Gastrostomy tube is again noted. IMPRESSION: Distal tip of enteric tube is seen in expected position of distal stomach. Electronically Signed   By: Lynwood Landy Raddle M.D.   On: 03/27/2024 12:38   DG Naso/Oro Danetta Thru Duo-Reposition Result Date: 03/23/2024 INDICATION: 43 year old female with ICH, hydrocephalus, severe gastroparesis, and previous successful post pyloric Dobbhoff placement on 03/21/2024. Team reporting unable to use Dobbhoff and meeting resistance with flushing. Request  for evaluation and possible repositioning. EXAM: IR NASO/ORO GTUBE THRU DUO - REPOSITION FLUOROSCOPY TIME:  Radiation Exposure Index (as provided by the fluoroscopic device): 11 mGy Kerma COMPLICATIONS: None immediate PROCEDURE: Initial imaging revealed the Dobbhoff was kinked within the small bowel likely contributing to resistance with use. The Dobhoff tube was subsequently lubricated with viscous lidocaine  inserted into the right nostril. Under intermittent fluoroscopic guidance, approximately 12 cm of the Dobhoff tube was gently retracted through the right nare until the Dobbhoff was no longer kinked. Approximately 15 mL of Omnipaque  300 was flushed through the tube without meeting resistance. Contrast was noted in the duodenum with a small amount of reflux into the stomach. The tube was then flushed with approximately 5 mL of water . The tip remains post pyloric. A spot fluoroscopic image was saved for documentation purposes. The tube was affixed to the patient's nose with a bridle. The patient tolerated the procedure well without immediate postprocedural complication. IMPRESSION: Successful fluoroscopic guided repositioning of Dobhoff tube with tip terminating over the duodenojejunal junction. The tube is ready for immediate use. This exam was performed by [facility] PA-C, and was supervised and interpreted by Dr. DELENA Balder. Electronically Signed   By: Juliene Balder M.D.   On: 03/23/2024 10:48   DG Luwana MATSU Tube Plc W/Fl W/Rad Result Date: 03/21/2024 INDICATION: Patient is in the hospital with complications from hydrocephalus, ICH. Severe gastroparesis. Patient had G tube placed earlier today and patient pulled her prior post pyloric NG tube after. Unsuccessful post pyloric NG tube placement yesterday. Consult for re-attempt today. MEDICATIONS: 4 ml 2% viscous lidocaine .  35 mL Omnipaque  300 ANESTHESIA/SEDATION: None CONTRAST:  35 mL Omnipaque  300 - administered into the stomach and proximal duodenum.  FLUOROSCOPY: Radiation Exposure Index (as provided by the fluoroscopic device): 101.50 mGy Kerma COMPLICATIONS: None immediate. PROCEDURE: Informed written consent was obtained after a thorough discussion of the procedural risks, benefits and alternatives. All questions were addressed. Maximal Sterile Barrier Technique was utilized including caps, mask, sterile gowns, sterile gloves, hand hygiene and skin antiseptic. A timeout was performed prior to the initiation of the procedure. Nasogastric tube remained in the stomach advanced to the level of the pylorus from placement attempt yesterday. Viscous lidocaine  was initially given intranasally for  pain relief while manipulating NG tube. Guidewire was introduced through the NG tube and was able to be advanced through the pylorus into the duodenum after multiple attempts. The NG tube was advanced over the guidewire into this space. Contrast was then administered with contrast seen traveling through the proximal small bowel showing successful post-pyloric placement in the lower descending duodenum. Guidewire was removed and NG tube was secured in place. Images saved. IMPRESSION: Successful post-pyloric nasogastric tube placement with tip in the lower descending duodenum. Performed by: Wyatt Pommier, PA-C Supervised by: Selinda Blue, MD Electronically Signed   By: Selinda DELENA Blue M.D.   On: 03/21/2024 12:51   DG Luwana MATSU Tube Plc W/Fl W/Rad Result Date: 03/21/2024 INDICATION: Patient is in the hospital with complications from hydrocephalus, ICH. Significant gastroparesis. Patient had G tube placed earlier today and patient pulled her prior post pyloric NG tube after. IR consulted to re-attempt placement of post pyloric NG tube. MEDICATIONS: 10 ml 2% viscous lidocaine .  50 mL Omnipaque  300; ANESTHESIA/SEDATION: None CONTRAST:  50 mL Omnipaque  300 - administered into the stomach FLUOROSCOPY: Radiation Exposure Index (as provided by the fluoroscopic device): 134.60 mGy Kerma  COMPLICATIONS: None immediate. PROCEDURE: Informed written consent was obtained after a thorough discussion of the procedural risks, benefits and alternatives. All questions were addressed. Maximal Sterile Barrier Technique was utilized including caps, mask, sterile gowns, sterile gloves, sterile drape, hand hygiene and skin antiseptic. A timeout was performed prior to the initiation of the procedure. IR tech had advanced a 10 Fr NG tube into the stomach upon my arrival to the room. Multiple attempts at advancing the guidewire and nasogastric tube were made. Contrast was administered and showed a significantly curved transition from the pylorus to the duodenum, further complicating passage of the guidewire and NG tube. Patient was moved to multiple positions and both air and 50 mL of water  was given to try and open the pylorus. Ultimately unsuccessful at passing the wire or nasogastric tube into the post pyloric space. NG tube was left in the stomach at the level of the pylorus. IMPRESSION: *Unsuccessful placement of post-pyloric nasogastric tube. The nasogastric tube was left in the stomach at the level of pylorus. Performed by: Wyatt Pommier, PA-C Electronically Signed   By: Ree Molt M.D.   On: 03/21/2024 11:23   IR GASTROSTOMY TUBE MOD SED Result Date: 03/20/2024 INDICATION: Dysphagia. EXAM: PERCUTANEOUS GASTROSTOMY TUBE PLACEMENT COMPARISON:  CTA was, 03/19/2024 MEDICATIONS: Ancef  2 gm IV; Antibiotics were administered within 1 hour of the procedure. 0.5 mg glucagon  IV CONTRAST:  20 mL of Isovue  300 administered into the gastric lumen. ANESTHESIA/SEDATION: Moderate (conscious) sedation was employed during this procedure. A total of Versed  1.5 mg and Fentanyl  100 mcg was administered intravenously. Moderate Sedation Time: 22 minutes. The patient's level of consciousness and vital signs were monitored continuously by radiology nursing throughout the procedure under my direct supervision. FLUOROSCOPY:  Radiation Exposure Index and estimated peak skin dose (PSD); Reference air kerma (RAK), 1.1 mGy. Kerma-area product (KAP), 24.1 uGy*m. COMPLICATIONS: None immediate. PROCEDURE: Informed written consent was obtained from the patient and/or patient's representative following explanation of the procedure, risks, benefits and alternatives. A time out was performed prior to the initiation of the procedure. Maximal barrier sterile technique utilized including caps, mask, sterile gowns, sterile gloves, large sterile drape, hand hygiene and sterile prep. The LEFT costal margin and barium opacified transverse colon were identified and avoided. Air was injected into the stomach for insufflation and visualization under fluoroscopy. Under  sterile conditions and local anesthesia, 2 T tacks were utilized to pexy the anterior aspect of the stomach against the ventral abdominal wall. Contrast injection confirmed appropriate positioning of each of the T tacks. An incision was made between the T tacks and a 17 gauge trocar needle was utilized to access the stomach. Needle position was confirmed within the stomach with aspiration of air and injection of a small amount of contrast. A stiff guidewire was advanced into the gastric lumen and under intermittent fluoroscopic guidance, the access needle was exchanged for a telescoping peel-away sheath, ultimately allowing placement of a 18 Fr balloon retention gastrostomy tube. The retention balloon was insufflated with a mixture of dilute saline and contrast and pulled taut against the anterior wall of the stomach. The external disc was cinched. Contrast injection confirms positioning within the stomach. Several spot radiographic images were obtained in various obliquities for documentation. The patient tolerated procedure well without immediate post procedural complication. FINDINGS: After successful fluoroscopic guided placement, the gastrostomy tube is appropriately positioned with  internal retention balloon against the ventral aspect of the gastric lumen. IMPRESSION: Successful fluoroscopic insertion of a 18 Fr balloon retention gastrostomy tube. The gastrostomy may be used immediately for medication administration and in 4 hrs for the initiation of feeds. RECOMMENDATIONS: The patient will return to V[facility] Inter[facility]iology (VIR) for routine feeding tube evaluation and exchange in 6 months. Thom Hall, MD [facility] Electronically Signed   By: Thom Hall M.D.   On: 03/20/2024 17:17   CT ABDOMEN WO CONTRAST Result Date: 03/19/2024 CLINICAL DATA:  Evaluation of operative window for G-J tube placement in IR EXAM: CT ABDOMEN WITHOUT CONTRAST TECHNIQUE: Multidetector CT imaging of the abdomen was performed following the standard protocol without IV contrast. RADIATION DOSE REDUCTION: This exam was performed according to the departmental dose-optimization program which includes automated exposure control, adjustment of the mA and/or kV according to patient size and/or use of iterative reconstruction technique. COMPARISON:  02/13/2024 FINDINGS: Lower chest: Dependent bibasilar opacities compatible with atelectasis. No effusions. Mild cardiomegaly. Coronary artery and aortic atherosclerosis. Hepatobiliary: Small layering gallstones within the gallbladder. No biliary ductal dilatation or focal hepatic abnormality. Pancreas: No focal abnormality or ductal dilatation. Spleen: Splenomegaly with a craniocaudal length 14.7 cm. Scattered calcifications throughout the spleen. No suspicious focal abnormality. Adrenals/Urinary Tract: Adrenal glands normal. Atrophic kidneys with diffuse renovascular calcifications. No suspicious focal renal abnormality or hydronephrosis. Stomach/Bowel: Feeding tube coils in the fundus of the stomach with the tip in the distal stomach. Stomach, visualized large and small bowel decompressed. No bowel  obstruction or inflammatory process. Clear operative window to the mid/distal stomach present. Vascular/Lymphatic: Aortic atherosclerosis. No evidence of aneurysm or adenopathy. Other: No free fluid or free air. Musculoskeletal: No acute bony abnormality. IMPRESSION: Splenomegaly. Cholelithiasis.  No CT evidence of acute cholecystitis. Bilateral atrophic kidneys. Heavily calcified aorta and branch vessels. No acute findings. Electronically Signed   By: Franky Crease M.D.   On: 03/19/2024 11:43   ECHOCARDIOGRAM LIMITED Result Date: 03/16/2024    ECHOCARDIOGRAM LIMITED REPORT   Patient Name:   [name] [name] Date of Exam: 03/16/2024 Medical Rec #:  [id number]          Height:       67.5 in Accession #:    [id number]         Weight:       114.6 lb Date of Birth:  [birth year]  BSA:          1.605 m Patient Age:    43 years           BP:           151/68 mmHg Patient Gender: F                  HR:           100 bpm. Exam Location:  Inpatient Procedure: Limited Echo, Limited Color Doppler and Cardiac Doppler (Both            Spectral and Color Flow Doppler were utilized during procedure). Indications:    Chest Pain  History:        Patient has prior history of Echocardiogram examinations.                 Signs/Symptoms:Chest Pain.  Sonographer:    Vella Key Referring Phys: [id number] OMAIR LATIF [facility] IMPRESSIONS  1. Left ventricular ejection fraction, by estimation, is 60 to 65%. The left ventricle has normal function. The left ventricle has no regional wall motion abnormalities. There is mild left ventricular hypertrophy. Left ventricular diastolic parameters are indeterminate.  2. Right ventricular systolic function is normal. The right ventricular size is normal.  3. A small pericardial effusion is present.  4. Trivial mitral valve regurgitation.  5. The aortic valve is tricuspid. Aortic valve regurgitation is not visualized. Aortic valve sclerosis/calcification is present, without any evidence of aortic  stenosis.  6. The inferior vena cava is normal in size with greater than 50% respiratory variability, suggesting right atrial pressure of 3 mmHg. FINDINGS  Left Ventricle: Left ventricular ejection fraction, by estimation, is 60 to 65%. The left ventricle has normal function. The left ventricle has no regional wall motion abnormalities. The left ventricular internal cavity size was normal in size. There is  mild left ventricular hypertrophy. Left ventricular diastolic parameters are indeterminate. Right Ventricle: The right ventricular size is normal. Right vetricular wall thickness was not assessed. Right ventricular systolic function is normal. Left Atrium: Left atrial size was normal in size. Right Atrium: Right atrial size was normal in size. Pericardium: A small pericardial effusion is present. Mitral Valve: There is mild thickening of the mitral valve leaflet(s). Mild to moderate mitral annular calcification. Trivial mitral valve regurgitation. Tricuspid Valve: The tricuspid valve is normal in structure. Aortic Valve: The aortic valve is tricuspid. Aortic valve regurgitation is not visualized. Aortic valve sclerosis/calcification is present, without any evidence of aortic stenosis. Pulmonic Valve: The pulmonic valve was normal in structure. Pulmonic valve regurgitation is not visualized. Aorta: The aortic root and ascending aorta are structurally normal, with no evidence of dilitation. Venous: The inferior vena cava is normal in size with greater than 50% respiratory variability, suggesting right atrial pressure of 3 mmHg. IAS/Shunts: No atrial level shunt detected by color flow Doppler. LEFT VENTRICLE PLAX 2D LVIDd:         3.80 cm LVIDs:         2.60 cm LV PW:         1.20 cm LV IVS:        1.20 cm LVOT diam:     1.60 cm LV SV:         45 LV SV Index:   28 LVOT Area:     2.01 cm  LV Volumes (MOD) LV vol d, MOD A2C: 83.7 ml LV vol d, MOD A4C: 98.2 ml LV vol s, MOD A2C: 28.2 ml LV  vol s, MOD A4C: 35.2 ml LV  SV MOD A2C:     55.5 ml LV SV MOD A4C:     98.2 ml LV SV MOD BP:      61.3 ml LEFT ATRIUM           Index LA diam:      3.30 cm 2.06 cm/m LA Vol (A4C): 46.6 ml 29.03 ml/m  AORTIC VALVE LVOT Vmax:   125.00 cm/s LVOT Vmean:  91.800 cm/s LVOT VTI:    0.225 m  AORTA Ao Root diam: 2.60 cm  SHUNTS Systemic VTI:  0.22 m Systemic Diam: 1.60 cm Vina Gull MD Electronically signed by Vina Gull MD Signature Date/Time: 03/16/2024/5:27:59 PM    Final    DG Abd 1 View Result Date: 03/14/2024 CLINICAL DATA:  Feeding tube placement EXAM: ABDOMEN - 1 VIEW FLUOROSCOPY TIME:  Fluoroscopy Time:  42 seconds Radiation Exposure Index (if provided by the fluoroscopic device): 1.6 mGy Number of Acquired Spot Images: 1 COMPARISON:  Radiograph 03/13/2024 FINDINGS: Enteric tube tip in the third portion of the duodenum. Instilled enteric contrast is present in the descending duodenum. IMPRESSION: Enteric tube tip in the third portion of the duodenum. Electronically Signed   By: Norman Gatlin M.D.   On: 03/14/2024 18:05   DG Abd 1 View Result Date: 03/13/2024 CLINICAL DATA:  Feeding tube placement EXAM: ABDOMEN - 1 VIEW COMPARISON:  Abdominal radiograph dated 03/12/2024 FINDINGS: Enteric tube tip projects over the right upper quadrant, likely within the proximal duodenum. Contrast opacification of the proximal duodenal lumen. IMPRESSION: Enteric tube tip projects over the right upper quadrant, likely within the proximal duodenum. Electronically Signed   By: Limin  Xu M.D.   On: 03/13/2024 14:47   DG Abd Portable 1V Result Date: 03/12/2024 CLINICAL DATA:  [id number] Encounter for feeding tube placement [id number] EXAM: PORTABLE ABDOMEN - 1 VIEW COMPARISON:  March 04, 2024 FINDINGS: Weighted feeding tube is coiled in the stomach. Nonobstructive bowel gas pattern. No pneumoperitoneum. No organomegaly or radiopaque calculi. The lung bases are clear. IMPRESSION: Redemonstrated weighted feeding tube, which has been retracted in the interim, now  coiled in the stomach. Electronically Signed   By: Rogelia Myers M.D.   On: 03/12/2024 12:34   MR BRAIN WO CONTRAST Result Date: 03/10/2024 CLINICAL DATA:  Stroke evaluation, new stroke noted on CT. EXAM: MRI HEAD WITHOUT CONTRAST TECHNIQUE: Multiplanar, multiecho pulse sequences of the brain and surrounding structures were obtained without intravenous contrast. COMPARISON:  Earlier same day CT head, MRI head 03/04/2024. FINDINGS: Brain: Redemonstrated evolving subacute infarct in the left parietal lobe with areas of petechial hemorrhage along the left parietal cortex. There is a punctate focus of acute infarct within the medial left occipital cortex. No evidence of acute infarct within the posterior left cerebellum. There are areas of susceptibility in the left cerebellum corresponding to hemorrhage noted on CT. No significant associated edema. Chronic microvascular ischemic changes. Remote infarcts in the right corona radiata, bilateral basal ganglia, pons, and inferomedial left cerebellum. Ventricles: Removal of previously noted right frontal approach EVD catheter with encephalomalacia along the catheter tract noted. Similar size and configuration of the ventricles. Minimal gas noted within the right frontal horn. Trace blood products layering in the occipital horns which are slightly decreased. Vascular: Skull base flow voids are visualized. Skull and upper cervical spine: No focal abnormality. Sinuses/Orbits: Bilateral lens replacement.  Sinuses are clear. Other: Bilateral mastoid effusions. IMPRESSION: Small foci of hemorrhage in the posterior left cerebellum without evidence  of acute infarct. Punctate focus of acute infarct involving the medial left occipital cortex. 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 Trace blood products in the occipital horns, decreased from prior. Chronic microvascular ischemic changes and remote infarcts again noted. Electronically Signed   By: Donnice Mania M.D.    On: 03/10/2024 22:35   CT HEAD WO CONTRAST ( ) Result Date: 03/10/2024 CLINICAL DATA:  Provided history: Subarachnoid hemorrhage. EXAM: CT HEAD WITHOUT CONTRAST TECHNIQUE: Contiguous axial images were obtained from the base of the skull through the vertex without intravenous contrast. RADIATION DOSE REDUCTION: This exam was performed according to the departmental dose-optimization program which includes automated exposure control, adjustment of the mA and/or kV according to patient size and/or use of iterative reconstruction technique. COMPARISON:  Brain MRI 03/04/2024. Head CT 03/04/2024. Head CT 02/20/2024. FINDINGS: Brain: A previously demonstrated right frontal approach EVD has been removed. Decreased pneumoventricle, now with only small-volume gas present within the right lateral ventricle frontal horn. Persistent small-volume hemorrhage layering within the occipital horns of both lateral ventricles. Ventricular dilation has not significantly changed. Known early subacute infarct within the left parietal lobe and at the left temporoparietal junction. Progressive gyriform hyperdensity within portions of this infarct, which may reflect petechial hemorrhage and/or cortical laminar necrosis. New focus of ill-defined hyperdensity within the posterior left cerebellar hemisphere suspicious for an interval infarct with petechial hemorrhage. Previously demonstrated subarachnoid hemorrhage is no longer appreciated. Redemonstrated chronic infarcts within the right corona radiata/basal ganglia, left basal ganglia, left aspect of the pons and inferomedial left cerebellar hemisphere. Mild patchy and ill-defined hypoattenuation elsewhere within the cerebral white matter, nonspecific but compatible chronic small vessel ischemic disease. No evidence of an intracranial mass. No midline shift. Vascular: No hyperdense vessel.  Atherosclerotic calcifications. Skull: Right frontal burr hole. No calvarial fracture or aggressive  osseous lesion. Sinuses/Orbits: No mass or acute finding within the imaged orbits. Mild mucosal thickening within the left sphenoid sinus. Partially visualized nasoenteric tube. Other: Small-volume fluid within bilateral mastoid air cells. Impressions #3 and #4 will be called to the ordering clinician or representative by the Radiologist Assistant, and communication documented in the PACS or [employer]. IMPRESSION: 1. A previously demonstrated right frontal approach EVD has been removed. Decreased pneumoventricle. 2. Persistent small-volume hemorrhage layering within the occipital horns of both lateral ventricles. Ventricular dilation has not significantly changed. 3. Known early subacute infarct within the left parietal lobe and at the left temporoparietal junction. Progressive gyriform hyperdensity within portions of this infarct, which may reflect petechial hemorrhage and/or cortical laminar necrosis. 4. New focus of ill-defined hyperdensity within the posterior left cerebellar hemisphere suspicious for an interval infarct with petechial hemorrhage. Consider a brain MRI for further evaluation. 5. Previously demonstrated subarachnoid hemorrhage is no longer appreciated. Electronically Signed   By: Rockey Childs D.O.   On: 03/10/2024 10:26   VAS US  TRANSCRANIAL DOPPLER Result Date: 03/06/2024  Transcranial Doppler Patient Name:  SHAREA GUINTHER  Date of Exam:   03/05/2024 Medical Rec #: [id number]           Accession #:    [id number] Date of Birth: [birth year]            Patient Gender: F Patient Age:   35 years Exam Location:  [facility] Procedure:      VAS US  TRANSCRANIAL DOPPLER Referring Phys: ARY XU --------------------------------------------------------------------------------  Indications: Subarachnoid hemorrhage. History: Right frontal ventriculostomy on 02/14/2024. Performing Technologist: Ezzie Potters RVT, RDMS  Examination Guidelines: A complete evaluation includes  B-mode imaging, spectral  Doppler, color Doppler, and power Doppler as needed of all accessible portions of each vessel. Bilateral testing is considered an integral part of a complete examination. Limited examinations for reoccurring indications may be performed as noted.  +----------+---------------+----------+-----------+------------------+ RIGHT TCD Right VM (cm/s)Depth (cm)Pulsatility     Comment       +----------+---------------+----------+-----------+------------------+ MCA             75          4.90      1.45       PSV 148 cm/s    +----------+---------------+----------+-----------+------------------+ ACA                                           unable to insonate +----------+---------------+----------+-----------+------------------+ Term ICA        39                    1.45                       +----------+---------------+----------+-----------+------------------+ PCA P1          34                    1.69                       +----------+---------------+----------+-----------+------------------+ Opthalmic       17                    1.99                       +----------+---------------+----------+-----------+------------------+ ICA siphon                                    unable to insonate +----------+---------------+----------+-----------+------------------+ Vertebral       -15                   1.27                       +----------+---------------+----------+-----------+------------------+ Distal ICA      38                    1.40                       +----------+---------------+----------+-----------+------------------+  +----------+--------------+----------+-----------+------------------+ LEFT TCD  Left VM (cm/s)Depth (cm)Pulsatility     Comment       +----------+--------------+----------+-----------+------------------+ MCA             48                   1.42                       +----------+--------------+----------+-----------+------------------+  ACA            -19                   1.76                       +----------+--------------+----------+-----------+------------------+ Term ICA        27  1.67                       +----------+--------------+----------+-----------+------------------+ PCA P1          24                   1.36                       +----------+--------------+----------+-----------+------------------+ Opthalmic       23                   2.04                       +----------+--------------+----------+-----------+------------------+ ICA siphon                                   unable to insonate +----------+--------------+----------+-----------+------------------+ Vertebral      -37                   1.75                       +----------+--------------+----------+-----------+------------------+ Distal ICA     -30                   1.47                       +----------+--------------+----------+-----------+------------------+  +------------+-------+-------+             VM cm/sComment +------------+-------+-------+ Prox Basilar  -24   1.75   +------------+-------+-------+ Dist Basilar  -21   1.81   +------------+-------+-------+ +----------------------+----+ Right Lindegaard Ratio1.97 +----------------------+----+ +---------------------+---+ Left Lindegaard Ratio1.6 +---------------------+---+  Summary:   Normal mean flow velocities in identified vessels of anterior and posterior cerebral circulations. *See table(s) above for TCD measurements and observations.  Diagnosing physician: Eather Popp MD Electronically signed by Eather Popp MD on 03/06/2024 at 1:59:00 PM.    Final    Overnight EEG with video Result Date: 03/05/2024 Shelton Arlin KIDD, MD     03/06/2024 10:54 AM Patient Name: [name] [name] MRN: [id number] Epilepsy Attending: Arlin KIDD Shelton Referring Physician/Provider: Khaliqdina, Salman, MD Duration: 03/05/2024 9482 to 03/06/2024 9482 Patient  history:  43 y.o. female with history of hypertension, ESRD on HD, s/p renal and pancreatic transplant, diabetes mellitus type 1 with gastroparesis presents with nausea and vomiting since after dialysis. Admitted initially for sepsis felt to be secondary to colitis. Found to have perimesencephalic SAH with IVH suspected to be either hypertensive in etiology vs secondary to peripontine venous anomaly. This was complicated by hydropcehalus s/p EVD placement. Imaging also demonstrated a small left cerbellar infarct, presumed secondary to vasospasm. Neurology reconsulted overnight for possible seizures. EEG to evaluate for seizure. Level of alertness:  comatose/ lethargic AEDs during EEG study: None Technical aspects: This EEG study was done with scalp electrodes positioned according to the 10-20 International system of electrode placement. Electrical activity was reviewed with band pass filter of 1-70Hz , sensitivity of 7 uV/mm, display speed of 78mm/sec with a 60Hz  notched filter applied as appropriate. EEG data were recorded continuously and digitally stored.  Video monitoring was available and reviewed as appropriate. Description: EEG showed continuous generalized polymorphic sharply contoured 3 to 6 Hz theta-delta slowing, appeared more sharply contoured when awake/stimulated.  Sharp waves were also noted in left centro-  parietal region. hyperventilation and photic stimulation were not performed.   ABNORMALITY -Sharp waves, left centro- parietal region - Continuous slow, generalized IMPRESSION: This study showed evidence of epileptogenicity arising from left centro- parietal region. Additionally there is moderate to severe diffuse encephalopathy. No seizures were seen throughout the recording. Arlin MALVA Krebs   MR BRAIN WO CONTRAST Result Date: 03/05/2024 CLINICAL DATA:  Mental status change, unknown cause EXAM: MRI HEAD WITHOUT CONTRAST TECHNIQUE: Multiplanar, multiecho pulse sequences of the brain and  surrounding structures were obtained without intravenous contrast. COMPARISON:  MRI head February 17, 2024. CT head from earlier today. FINDINGS: Brain: The area of hypodensity seen on recent CT head correlates with peripheral cortical restricted diffusion and edema, compatible with acute or early subacute infarct. Susceptibility artifact and T2 hypointensity in the right temporal horn is consistent with pneumo ventricle and similar cross modalities to recent CT head. Right frontal approach EVD in place with similar size of the ventricles which remain mildly enlarged. Layering signal abnormality within the lateral ventricles is consistent with intraventricular hemorrhage which is also similar across modalities. Similar small focus of encephalomalacia in the cerebellum. Small remote lacunar infarcts in the thalami. Scattered T2/FLAIR hyperintensities in the white matter, compatible with chronic microvascular ischemic disease. Vascular: Normal flow voids. Skull and upper cervical spine: Normal marrow signal. Sinuses/Orbits: Mild paranasal sinus mucosal thickening. No acute orbital findings. IMPRESSION: 1. Left parietal acute or early subacute infarct with associated edema. No substantial mass effect. 2. Right frontal approach EVD with similar pneumoventricle, mild ventriculomegaly, and small volume intraventricular hemorrhage. Scattered small volume of subarachnoid hemorrhage better characterized on recent CT head. 3. Small remote cerebellar and bilateral thalamic lacunar infarcts and chronic microvascular ischemic disease. Electronically Signed   By: Gilmore GORMAN Molt M.D.   On: 03/05/2024 03:05   DG Abd 1 View Result Date: 03/04/2024 CLINICAL DATA:  Encounter for feeding tube placement. EXAM: ABDOMEN - 1 VIEW COMPARISON:  None Available. FINDINGS: Feeding tube placement was performed by radiology technologist. No radiologist was involved in this exam. Four fluoroscopic spot images submitted. A 10 French Cortrak tube  was placed, tip appears to be post pyloric within the distal duodenum. 20 cc of Omnipaque  300 was injected through the tube. Fluoroscopy time 2 minutes 54 seconds. Dose 13.5 mGy. IMPRESSION: Feeding tube placement with tip post pyloric in the proximal duodenum. Electronically Signed   By: Andrea Gasman M.D.   On: 03/04/2024 15:59   CT HEAD WO CONTRAST ( ) Result Date: 03/04/2024 CLINICAL DATA:  43 year old female dialysis patient with acute intracranial hemorrhage on 02/14/2024. Subarachnoid and subdural blood. External ventricular drain removed. EXAM: CT HEAD WITHOUT CONTRAST TECHNIQUE: Contiguous axial images were obtained from the base of the skull through the vertex without intravenous contrast. RADIATION DOSE REDUCTION: This exam was performed according to the departmental dose-optimization program which includes automated exposure control, adjustment of the mA and/or kV according to patient size and/or use of iterative reconstruction technique. COMPARISON:  Head CT 02/20/2024 and earlier. FINDINGS: Brain: Right temporal horn pneumo ventricle. That temporal horn mildly dilated now, but otherwise no significant change in lateral ventricle size. Fourth ventricle appears larger on sagittal image 30. Third ventricle also slightly larger on coronal image 34. No transependymal edema. Trace residual layering IVH in the occipital horns. Gas also along the EVD tract. No midline shift. Basilar cisterns are patent. Trace residual subarachnoid blood. No convincing subdural hematoma. Heterogeneous gray and white matter in the left parietal lobe in an area of 2 cm  on sagittal image 39 is new, resembles mild cerebral edema but there is no regional mass effect. Gray-white differentiation elsewhere is maintained. Small area of chronic encephalomalacia in the left cerebellar tonsil is stable. Vascular: Calcified atherosclerosis at the skull base. No suspicious intracranial vascular hyperdensity. Skull: Right superior  frontal burr hole.  Otherwise intact. Sinuses/Orbits: Left nasoenteric tube in place. Visualized paranasal sinuses and mastoids are stable and well aerated. Other: Postoperative changes to the right scalp vertex. No new orbit or scalp soft tissue finding. Calcified scalp vessel atherosclerosis. IMPRESSION: 1. Right frontal approach EVD removed with mild pneumocephalus and right temporal horn pneumo ventricle. The right temporal horn, lateral and 3rd ventricles are mildly larger since 02/20/2024. Trace residual SAH, IVH and no transependymal edema. 2. New 2 cm area of heterogeneous hypodensity in the left parietal lobe resembling cerebral edema. No regional or intracranial mass effect. 3. Stable gray-white differentiation elsewhere, including mild chronic encephalomalacia in the left inferior cerebellum. Electronically Signed   By: VEAR Hurst M.D.   On: 03/04/2024 11:35   VAS US  TRANSCRANIAL DOPPLER Result Date: 03/03/2024  Transcranial Doppler Patient Name:  PRESTYN MAHN  Date of Exam:   03/03/2024 Medical Rec #: [id number]           Accession #:    [id number] Date of Birth: 11/09/1980            Patient Gender: F Patient Age:   11 years Exam Location:  [facility] Procedure:      VAS US  TRANSCRANIAL DOPPLER Referring Phys: ARY XU --------------------------------------------------------------------------------  Indications: Subarachnoid hemorrhage. Limitations: patient positioning Comparison Study: 02/18/2024, 02/20/2024, 02/22/2024, 02/26/2024, 02/29/2024 Performing Technologist: Cordella Collet RVT  Examination Guidelines: A complete evaluation includes B-mode imaging, spectral Doppler, color Doppler, and power Doppler as needed of all accessible portions of each vessel. Bilateral testing is considered an integral part of a complete examination. Limited examinations for reoccurring indications may be performed as noted.  +----------+---------------+----------+-----------+------------------+ RIGHT TCD  Right VM (cm/s)Depth (cm)Pulsatility     Comment       +----------+---------------+----------+-----------+------------------+ MCA             45                    1.12                       +----------+---------------+----------+-----------+------------------+ ACA             -37                   1.59                       +----------+---------------+----------+-----------+------------------+ Term ICA        36                    1.54                       +----------+---------------+----------+-----------+------------------+ PCA P1          24                    1.47                       +----------+---------------+----------+-----------+------------------+ Opthalmic       31  2.01                       +----------+---------------+----------+-----------+------------------+ ICA siphon                                    Unable to insonate +----------+---------------+----------+-----------+------------------+ Vertebral       -15                    1.5                       +----------+---------------+----------+-----------+------------------+ Distal ICA      -10                   1.56                       +----------+---------------+----------+-----------+------------------+  +----------+--------------+----------+-----------+------------------+ LEFT TCD  Left VM (cm/s)Depth (cm)Pulsatility     Comment       +----------+--------------+----------+-----------+------------------+ MCA             25                   1.09                       +----------+--------------+----------+-----------+------------------+ ACA            -27                   2.13                       +----------+--------------+----------+-----------+------------------+ Term ICA                                     Unable to insonate +----------+--------------+----------+-----------+------------------+ PCA P1                                        Unable to insonate +----------+--------------+----------+-----------+------------------+ Opthalmic       15                   1.76                       +----------+--------------+----------+-----------+------------------+ ICA siphon      14                   1.57                       +----------+--------------+----------+-----------+------------------+ Vertebral      -18                   1.35                       +----------+--------------+----------+-----------+------------------+ Distal ICA     -23                   1.39                       +----------+--------------+----------+-----------+------------------+  +------------+-------+------------------+             VM cm/s  Comment       +------------+-------+------------------+ Prox Basilar       Unable to insonate +------------+-------+------------------+ Dist Basilar       Unable to insonate +------------+-------+------------------+ +----------------------+---+ Right Lindegaard Ratio4.5 +----------------------+---+ +---------------------+----+ Left Lindegaard Ratio1.08 +---------------------+----+  Summary: This was a normal transcranial Doppler study, with normal flow direction and velocity of all identified vessels of the anterior and posterior circulations, with no evidence of stenosis, vasospasm or occlusion. There was no evidence of intracranial disease. Elevated pulsatility indices suggest increased intracranial pressure or diffuse intracranial atherosclerosis *See table(s) above for TCD measurements and observations.  Diagnosing physician: Eather Popp MD Electronically signed by Eather Popp MD on 03/03/2024 at 4:19:26 PM.    Final    DG Abd 1 View Result Date: 03/03/2024 CLINICAL DATA:  Feeding tube placement. EXAM: ABDOMEN - 1 VIEW COMPARISON:  02/14/2024. FINDINGS: Lymph nasogastric tube has been removed. Feeding to appears to be coiled in the gastric antrum. IMPRESSION: Feeding tube appears coiled  in the gastric antrum. Electronically Signed   By: Newell Eke M.D.   On: 03/03/2024 09:19    Labs: BNP (last 3 results) Recent Labs    02/14/24 0949  BNP 1,486.6*   Basic Metabolic Panel: Recent Labs  Lab 03/25/24 0610 03/25/24 0919 03/26/24 0552 03/26/24 1345 03/27/24 0544 03/27/24 0828 03/28/24 0619 03/29/24 0717 03/30/24 0614  NA 129*  --  130*  --   --   --  137 131* 131*  K 3.5   < > 4.1   < > 4.2 4.2 4.9 5.4* 4.6  CL 88*  --  86*  --   --   --  95* 96* 93*  CO2 30  --  31  --   --   --  28 22 25   GLUCOSE 130*  --  112*  --   --   --  90 111* 129*  BUN 28*  --  41*  --   --   --  38* 47* 62*  CREATININE 5.23*  --  6.29*  --   --   --  5.50* 5.99* 6.83*  CALCIUM  10.0  --  10.1  --   --   --  10.6* 9.6 9.8  PHOS 4.3  --  4.5  --   --   --  4.0  --   --    < > = values in this interval not displayed.   Liver Function Tests: Recent Labs  Lab 03/25/24 0610 03/26/24 0552 03/28/24 0619  ALBUMIN 2.4* 2.4* 2.3*   No results for input(s): LIPASE, AMYLASE in the last 168 hours. No results for input(s): AMMONIA in the last 168 hours. CBC: Recent Labs  Lab 03/26/24 0551 03/27/24 0544 03/29/24 0717 03/30/24 0614 03/31/24 0721  WBC 6.0 6.1 6.2 6.0 5.4  HGB 8.6* 8.5* 8.2* 7.2* 8.2*  HCT 25.6* 25.6* 25.4* 21.8* 24.3*  MCV 96.2 98.1 99.6 97.3 94.9  PLT 204 195 125* 163 169   CBG: Recent Labs  Lab 03/30/24 1620 03/30/24 1953 03/30/24 2309 03/31/24 0314 03/31/24 0754  GLUCAP 170* 134* 123* 119* 115*  Urinalysis    Component Value Date/Time   COLORURINE RED (A) 11/07/2021 1239   APPEARANCEUR TURBID (A) 11/07/2021 1239   LABSPEC  11/07/2021 1239    TEST NOT REPORTED DUE TO COLOR INTERFERENCE OF URINE PIGMENT   PHURINE  11/07/2021 1239    TEST NOT REPORTED DUE TO COLOR INTERFERENCE OF URINE PIGMENT   GLUCOSEU (A) 11/07/2021 1239  TEST NOT REPORTED DUE TO COLOR INTERFERENCE OF URINE PIGMENT   HGBUR (A) 11/07/2021 1239    TEST NOT REPORTED DUE TO  COLOR INTERFERENCE OF URINE PIGMENT   HGBUR large 12/03/2007 1409   BILIRUBINUR (A) 11/07/2021 1239    TEST NOT REPORTED DUE TO COLOR INTERFERENCE OF URINE PIGMENT   KETONESUR (A) 11/07/2021 1239    TEST NOT REPORTED DUE TO COLOR INTERFERENCE OF URINE PIGMENT   PROTEINUR (A) 11/07/2021 1239    TEST NOT REPORTED DUE TO COLOR INTERFERENCE OF URINE PIGMENT   UROBILINOGEN 0.2 01/13/2015 0124   NITRITE (A) 11/07/2021 1239    TEST NOT REPORTED DUE TO COLOR INTERFERENCE OF URINE PIGMENT   LEUKOCYTESUR (A) 11/07/2021 1239    TEST NOT REPORTED DUE TO COLOR INTERFERENCE OF URINE PIGMENT   Sepsis Labs Recent Labs  Lab 03/27/24 0544 03/29/24 0717 03/30/24 0614 03/31/24 0721  WBC 6.1 6.2 6.0 5.4   Microbiology No results found for this or any previous visit (from the past 240 hours).   Time coordinating discharge: 35 minutes  SIGNED: Mennie LAMY, MD  [name] 03/31/2024, 11:23 AM  If 7PM-7AM, please contact night-coverage [email or web address]

## 2024-03-31 NOTE — Progress Notes (Signed)
 Patient off the floor for dialysis.

## 2024-03-31 NOTE — Plan of Care (Signed)
  Problem: Clinical Measurements: Goal: Ability to maintain clinical measurements within normal limits will improve Outcome: Progressing Goal: Will remain free from infection Outcome: Progressing Goal: Diagnostic test results will improve Outcome: Progressing Goal: Respiratory complications will improve Outcome: Progressing   Problem: Activity: Goal: Risk for activity intolerance will decrease Outcome: Progressing   Problem: Nutrition: Goal: Adequate nutrition will be maintained Outcome: Progressing   Problem: Coping: Goal: Level of anxiety will decrease Outcome: Progressing   Problem: Elimination: Goal: Will not experience complications related to bowel motility Outcome: Progressing   Problem: Pain Managment: Goal: General experience of comfort will improve and/or be controlled Outcome: Progressing   Problem: Safety: Goal: Ability to remain free from injury will improve Outcome: Progressing   Problem: Skin Integrity: Goal: Risk for impaired skin integrity will decrease Outcome: Progressing   Problem: Coping: Goal: Ability to adjust to condition or change in health will improve Outcome: Progressing   Problem: Metabolic: Goal: Ability to maintain appropriate glucose levels will improve Outcome: Progressing   Problem: Nutritional: Goal: Maintenance of adequate nutrition will improve Outcome: Progressing Goal: Progress toward achieving an optimal weight will improve Outcome: Progressing   Problem: Skin Integrity: Goal: Risk for impaired skin integrity will decrease Outcome: Progressing   Problem: Education: Goal: Knowledge of secondary prevention will improve (MUST DOCUMENT ALL) Outcome: Progressing Goal: Knowledge of patient specific risk factors will improve (DELETE if not current risk factor) Outcome: Progressing   Problem: Intracerebral Hemorrhage Tissue Perfusion: Goal: Complications of Intracerebral Hemorrhage will be minimized Outcome: Progressing    Problem: Health Behavior/Discharge Planning: Goal: Goals will be collaboratively established with patient/family Outcome: Progressing   Problem: Self-Care: Goal: Ability to communicate needs accurately will improve Outcome: Progressing   Problem: Nutrition: Goal: Risk of aspiration will decrease Outcome: Progressing Goal: Dietary intake will improve Outcome: Progressing   Problem: Education: Goal: Knowledge of disease and its progression will improve Outcome: Progressing Goal: Individualized Educational Video(s) Outcome: Progressing   Problem: Fluid Volume: Goal: Compliance with measures to maintain balanced fluid volume will improve Outcome: Progressing   Problem: Health Behavior/Discharge Planning: Goal: Ability to manage health-related needs will improve Outcome: Progressing   Problem: Nutritional: Goal: Ability to make healthy dietary choices will improve Outcome: Progressing   Problem: Clinical Measurements: Goal: Complications related to the disease process, condition or treatment will be avoided or minimized Outcome: Progressing

## 2024-03-31 NOTE — Progress Notes (Signed)
 D/C order noted. Confirmed with RN CM that pt will d/c to [facility] today. Update provided to renal NP and pt's out-pt HD clinic.   Randine Mungo Dialysis Navigator [phone]

## 2024-04-01 ENCOUNTER — Institutional Professional Consult (permissible substitution) (HOSPITAL_COMMUNITY): Payer: Self-pay

## 2024-04-01 ENCOUNTER — Institutional Professional Consult (permissible substitution)
Admit: 2024-04-01 | Discharge: 2024-04-16 | Disposition: A | Discharge status: Skilled Nursing Facility | Payer: Self-pay | Source: Other Acute Inpatient Hospital | Attending: Internal Medicine | Admitting: Internal Medicine

## 2024-04-01 LAB — CBC
HCT: 23.8 % — ABNORMAL LOW (ref 36.0–46.0)
Hemoglobin: 7.9 g/dL — ABNORMAL LOW (ref 12.0–15.0)
MCH: 31.3 pg (ref 26.0–34.0)
MCHC: 33.2 g/dL (ref 30.0–36.0)
MCV: 94.4 fL (ref 80.0–100.0)
Platelets: 186 K/uL (ref 150–400)
RBC: 2.52 MIL/uL — ABNORMAL LOW (ref 3.87–5.11)
RDW: 14.1 % (ref 11.5–15.5)
WBC: 4.6 K/uL (ref 4.0–10.5)
nRBC: 0 % (ref 0.0–0.2)

## 2024-04-01 LAB — COMPREHENSIVE METABOLIC PANEL WITH GFR
ALT: 9 U/L (ref 0–44)
AST: 17 U/L (ref 15–41)
Albumin: 2.3 g/dL — ABNORMAL LOW (ref 3.5–5.0)
Alkaline Phosphatase: 89 U/L (ref 38–126)
Anion gap: 11 (ref 5–15)
BUN: 32 mg/dL — ABNORMAL HIGH (ref 6–20)
CO2: 29 mmol/L (ref 22–32)
Calcium: 9.7 mg/dL (ref 8.9–10.3)
Chloride: 90 mmol/L — ABNORMAL LOW (ref 98–111)
Creatinine, Ser: 3.98 mg/dL — ABNORMAL HIGH (ref 0.44–1.00)
GFR, Estimated: 14 mL/min — ABNORMAL LOW (ref >60)
Glucose, Bld: 107 mg/dL — ABNORMAL HIGH (ref 70–99)
Potassium: 3.5 mmol/L (ref 3.5–5.1)
Sodium: 130 mmol/L — ABNORMAL LOW (ref 135–145)
Total Bilirubin: 0.2 mg/dL (ref 0.0–1.2)
Total Protein: 6 g/dL — ABNORMAL LOW (ref 6.5–8.1)

## 2024-04-01 LAB — GLUCOSE, CAPILLARY: Glucose-Capillary: 103 mg/dL — ABNORMAL HIGH (ref 70–99)

## 2024-04-01 MED ORDER — DIATRIZOATE MEGLUMINE & SODIUM 66-10 % PO SOLN
ORAL | Status: AC
  Filled 2024-04-01: qty 30

## 2024-04-01 MED ORDER — DIATRIZOATE MEGLUMINE & SODIUM 66-10 % PO SOLN
30.0000 mL | Freq: Once | ORAL | Status: AC
  Administered 2024-04-01: 30 mL

## 2024-04-01 NOTE — Unmapped External Note (Signed)
 RT Initial Assessment  Admitting Diagnosis: <principal problem not specified>  Admitting Diagnosis Reviewed: I have reviewed the patient's admitting diagnosis and problem list.  Past Medical History:  Past Medical History:  Diagnosis Date  . Diabetes mellitus   . Hypertension     Past Medical History Reviewed: I have reviewed the patient's past medical history.  ABG:    No results found for: PHART, PCO2ART, PO2ART, HCO3ART, BEART, O2SATART  Most Recent ABG Reviewed: Not available  Active Medications Reviewed: I have reviewed the patient's active medications.  Most Recent CXR Reviewed: Not available   RT Initial Assessment     Row Name 04/01/24 0411     Physical Assessment   Most Recent CXR CXR not available   Mallampati Score IV   R Breath Sounds Clear   L Breath Sounds Clear   Chest Excursion L equal to R   Breathing Pattern Eupnea   Cough None   Secretions/Sputum Amount None   Secretions/Sputum Color None   Sputum Consistency None   Pulse 90   RR 16   $ Respiratory Assessment Complete                 AVE COCA, RT 04/01/24 4:13 AM EDT

## 2024-04-01 NOTE — Progress Notes (Signed)
   04/01/24 0210  Vitals  Temp 97.8 F (36.6 C)  Temp Source Oral  BP (!) 155/51  MAP (mmHg) 81  BP Location Right Leg  BP Method Automatic  Patient Position (if appropriate) Lying  Pulse Rate 90  ECG Heart Rate 90  Resp 12  During Treatment Monitoring  Blood Flow Rate (mL/min) 0 mL/min  Arterial Pressure (mmHg) -1.01 mmHg  Venous Pressure (mmHg) -1.41 mmHg  TMP (mmHg) 14.54 mmHg  Ultrafiltration Rate (mL/min) 887 mL/min  Dialysate Flow Rate (mL/min) 299 ml/min  Dialysate Potassium Concentration 3  Dialysate Calcium  Concentration 2.5  Duration of HD Treatment -hour(s) 3 hour(s)  Cumulative Fluid Removed (mL) per Treatment  1600.13  Intra-Hemodialysis Comments Tx completed  Post Treatment  Dialyzer Clearance Lightly streaked  Liters Processed 72  Fluid Removed (mL) 1600 mL  Tolerated HD Treatment Yes  AVG/AVF Arterial Site Held (minutes) 10 minutes  AVG/AVF Venous Site Held (minutes) 10 minutes  Fistula / Graft Right Upper arm Arteriovenous fistula  Placement Date/Time: (c) 12/08/20 1403   Placed prior to admission: Yes  Orientation: Right  Access Location: Upper arm  Access Type: Arteriovenous fistula  Site Condition No complications  Fistula / Graft Assessment Present;Thrill;Bruit  Status Deaccessed  Needle Size 15  Drainage Description None

## 2024-04-03 LAB — RENAL FUNCTION PANEL
Albumin: 2.3 g/dL — ABNORMAL LOW (ref 3.5–5.0)
Anion gap: 8 (ref 5–15)
BUN: 14 mg/dL (ref 6–20)
CO2: 28 mmol/L (ref 22–32)
Calcium: 9.3 mg/dL (ref 8.9–10.3)
Chloride: 100 mmol/L (ref 98–111)
Creatinine, Ser: 2.36 mg/dL — ABNORMAL HIGH (ref 0.44–1.00)
GFR, Estimated: 26 mL/min — ABNORMAL LOW (ref >60)
Glucose, Bld: 149 mg/dL — ABNORMAL HIGH (ref 70–99)
Phosphorus: 1.8 mg/dL — ABNORMAL LOW (ref 2.5–4.6)
Potassium: 4.7 mmol/L (ref 3.5–5.1)
Sodium: 136 mmol/L (ref 135–145)

## 2024-04-03 LAB — CBC WITH DIFFERENTIAL/PLATELET
Abs Immature Granulocytes: 0.04 K/uL (ref 0.00–0.07)
Basophils Absolute: 0 K/uL (ref 0.0–0.1)
Basophils Relative: 0 %
Eosinophils Absolute: 0 K/uL (ref 0.0–0.5)
Eosinophils Relative: 1 %
HCT: 22.7 % — ABNORMAL LOW (ref 36.0–46.0)
Hemoglobin: 7.5 g/dL — ABNORMAL LOW (ref 12.0–15.0)
Immature Granulocytes: 1 %
Lymphocytes Relative: 7 %
Lymphs Abs: 0.3 K/uL — ABNORMAL LOW (ref 0.7–4.0)
MCH: 31.4 pg (ref 26.0–34.0)
MCHC: 33 g/dL (ref 30.0–36.0)
MCV: 95 fL (ref 80.0–100.0)
Monocytes Absolute: 0.3 K/uL (ref 0.1–1.0)
Monocytes Relative: 6 %
Neutro Abs: 3.9 K/uL (ref 1.7–7.7)
Neutrophils Relative %: 85 %
Platelets: 199 K/uL (ref 150–400)
RBC: 2.39 MIL/uL — ABNORMAL LOW (ref 3.87–5.11)
RDW: 14.3 % (ref 11.5–15.5)
WBC: 4.6 K/uL (ref 4.0–10.5)
nRBC: 0 % (ref 0.0–0.2)

## 2024-04-04 LAB — CBC WITH DIFFERENTIAL/PLATELET
Abs Immature Granulocytes: 0.04 K/uL (ref 0.00–0.07)
Basophils Absolute: 0 K/uL (ref 0.0–0.1)
Basophils Relative: 0 %
Eosinophils Absolute: 0.1 K/uL (ref 0.0–0.5)
Eosinophils Relative: 3 %
HCT: 23.8 % — ABNORMAL LOW (ref 36.0–46.0)
Hemoglobin: 7.6 g/dL — ABNORMAL LOW (ref 12.0–15.0)
Immature Granulocytes: 1 %
Lymphocytes Relative: 22 %
Lymphs Abs: 0.8 K/uL (ref 0.7–4.0)
MCH: 30.9 pg (ref 26.0–34.0)
MCHC: 31.9 g/dL (ref 30.0–36.0)
MCV: 96.7 fL (ref 80.0–100.0)
Monocytes Absolute: 0.5 K/uL (ref 0.1–1.0)
Monocytes Relative: 13 %
Neutro Abs: 2.2 K/uL (ref 1.7–7.7)
Neutrophils Relative %: 61 %
Platelets: 200 K/uL (ref 150–400)
RBC: 2.46 MIL/uL — ABNORMAL LOW (ref 3.87–5.11)
RDW: 14.6 % (ref 11.5–15.5)
WBC: 3.6 K/uL — ABNORMAL LOW (ref 4.0–10.5)
nRBC: 0 % (ref 0.0–0.2)

## 2024-04-04 LAB — RENAL FUNCTION PANEL
Albumin: 2.4 g/dL — ABNORMAL LOW (ref 3.5–5.0)
Anion gap: 11 (ref 5–15)
BUN: 25 mg/dL — ABNORMAL HIGH (ref 6–20)
CO2: 27 mmol/L (ref 22–32)
Calcium: 10.1 mg/dL (ref 8.9–10.3)
Chloride: 98 mmol/L (ref 98–111)
Creatinine, Ser: 3.42 mg/dL — ABNORMAL HIGH (ref 0.44–1.00)
GFR, Estimated: 16 mL/min — ABNORMAL LOW (ref >60)
Glucose, Bld: 125 mg/dL — ABNORMAL HIGH (ref 70–99)
Phosphorus: 2.5 mg/dL (ref 2.5–4.6)
Potassium: 4.5 mmol/L (ref 3.5–5.1)
Sodium: 136 mmol/L (ref 135–145)

## 2024-04-07 LAB — CBC WITH DIFFERENTIAL/PLATELET
Abs Immature Granulocytes: 0.03 K/uL (ref 0.00–0.07)
Basophils Absolute: 0 K/uL (ref 0.0–0.1)
Basophils Relative: 0 %
Eosinophils Absolute: 0.1 K/uL (ref 0.0–0.5)
Eosinophils Relative: 3 %
HCT: 24.9 % — ABNORMAL LOW (ref 36.0–46.0)
Hemoglobin: 7.9 g/dL — ABNORMAL LOW (ref 12.0–15.0)
Immature Granulocytes: 1 %
Lymphocytes Relative: 18 %
Lymphs Abs: 0.9 K/uL (ref 0.7–4.0)
MCH: 31.2 pg (ref 26.0–34.0)
MCHC: 31.7 g/dL (ref 30.0–36.0)
MCV: 98.4 fL (ref 80.0–100.0)
Monocytes Absolute: 0.5 K/uL (ref 0.1–1.0)
Monocytes Relative: 10 %
Neutro Abs: 3.5 K/uL (ref 1.7–7.7)
Neutrophils Relative %: 68 %
Platelets: 200 K/uL (ref 150–400)
RBC: 2.53 MIL/uL — ABNORMAL LOW (ref 3.87–5.11)
RDW: 14.7 % (ref 11.5–15.5)
WBC: 5.1 K/uL (ref 4.0–10.5)
nRBC: 0 % (ref 0.0–0.2)

## 2024-04-07 LAB — RENAL FUNCTION PANEL
Albumin: 2.4 g/dL — ABNORMAL LOW (ref 3.5–5.0)
Anion gap: 14 (ref 5–15)
BUN: 57 mg/dL — ABNORMAL HIGH (ref 6–20)
CO2: 27 mmol/L (ref 22–32)
Calcium: 10.9 mg/dL — ABNORMAL HIGH (ref 8.9–10.3)
Chloride: 93 mmol/L — ABNORMAL LOW (ref 98–111)
Creatinine, Ser: 4.9 mg/dL — ABNORMAL HIGH (ref 0.44–1.00)
GFR, Estimated: 11 mL/min — ABNORMAL LOW (ref >60)
Glucose, Bld: 128 mg/dL — ABNORMAL HIGH (ref 70–99)
Phosphorus: 3.3 mg/dL (ref 2.5–4.6)
Potassium: 5 mmol/L (ref 3.5–5.1)
Sodium: 134 mmol/L — ABNORMAL LOW (ref 135–145)

## 2024-04-09 LAB — CBC WITH DIFFERENTIAL/PLATELET
Abs Immature Granulocytes: 0.03 K/uL (ref 0.00–0.07)
Basophils Absolute: 0 K/uL (ref 0.0–0.1)
Basophils Relative: 0 %
Eosinophils Absolute: 0.1 K/uL (ref 0.0–0.5)
Eosinophils Relative: 2 %
HCT: 24.8 % — ABNORMAL LOW (ref 36.0–46.0)
Hemoglobin: 8 g/dL — ABNORMAL LOW (ref 12.0–15.0)
Immature Granulocytes: 1 %
Lymphocytes Relative: 26 %
Lymphs Abs: 1.1 K/uL (ref 0.7–4.0)
MCH: 31.5 pg (ref 26.0–34.0)
MCHC: 32.3 g/dL (ref 30.0–36.0)
MCV: 97.6 fL (ref 80.0–100.0)
Monocytes Absolute: 0.5 K/uL (ref 0.1–1.0)
Monocytes Relative: 11 %
Neutro Abs: 2.6 K/uL (ref 1.7–7.7)
Neutrophils Relative %: 60 %
Platelets: 174 K/uL (ref 150–400)
RBC: 2.54 MIL/uL — ABNORMAL LOW (ref 3.87–5.11)
RDW: 14.7 % (ref 11.5–15.5)
WBC: 4.3 K/uL (ref 4.0–10.5)
nRBC: 0 % (ref 0.0–0.2)

## 2024-04-09 LAB — RENAL FUNCTION PANEL
Albumin: 2.5 g/dL — ABNORMAL LOW (ref 3.5–5.0)
Anion gap: 13 (ref 5–15)
BUN: 49 mg/dL — ABNORMAL HIGH (ref 6–20)
CO2: 26 mmol/L (ref 22–32)
Calcium: 10.8 mg/dL — ABNORMAL HIGH (ref 8.9–10.3)
Chloride: 93 mmol/L — ABNORMAL LOW (ref 98–111)
Creatinine, Ser: 4.14 mg/dL — ABNORMAL HIGH (ref 0.44–1.00)
GFR, Estimated: 13 mL/min — ABNORMAL LOW (ref >60)
Glucose, Bld: 127 mg/dL — ABNORMAL HIGH (ref 70–99)
Phosphorus: 3.8 mg/dL (ref 2.5–4.6)
Potassium: 4 mmol/L (ref 3.5–5.1)
Sodium: 132 mmol/L — ABNORMAL LOW (ref 135–145)

## 2024-04-10 LAB — COMPREHENSIVE METABOLIC PANEL WITH GFR
ALT: 28 U/L (ref 0–44)
AST: 25 U/L (ref 15–41)
Albumin: 2.3 g/dL — ABNORMAL LOW (ref 3.5–5.0)
Alkaline Phosphatase: 118 U/L (ref 38–126)
Anion gap: 10 (ref 5–15)
BUN: 26 mg/dL — ABNORMAL HIGH (ref 6–20)
CO2: 28 mmol/L (ref 22–32)
Calcium: 10.4 mg/dL — ABNORMAL HIGH (ref 8.9–10.3)
Chloride: 96 mmol/L — ABNORMAL LOW (ref 98–111)
Creatinine, Ser: 2.83 mg/dL — ABNORMAL HIGH (ref 0.44–1.00)
GFR, Estimated: 21 mL/min — ABNORMAL LOW (ref >60)
Glucose, Bld: 110 mg/dL — ABNORMAL HIGH (ref 70–99)
Potassium: 4 mmol/L (ref 3.5–5.1)
Sodium: 134 mmol/L — ABNORMAL LOW (ref 135–145)
Total Bilirubin: 0.3 mg/dL (ref 0.0–1.2)
Total Protein: 5.4 g/dL — ABNORMAL LOW (ref 6.5–8.1)

## 2024-04-10 LAB — HEPATITIS C ANTIBODY: HCV Ab: NONREACTIVE

## 2024-04-10 LAB — HEPATITIS B SURFACE ANTIGEN: Hepatitis B Surface Ag: NONREACTIVE

## 2024-04-10 LAB — HEPATITIS B SURFACE ANTIBODY,QUALITATIVE: Hep B S Ab: REACTIVE — AB

## 2024-04-11 LAB — CBC WITH DIFFERENTIAL/PLATELET
Abs Immature Granulocytes: 0.04 K/uL (ref 0.00–0.07)
Basophils Absolute: 0 K/uL (ref 0.0–0.1)
Basophils Relative: 0 %
Eosinophils Absolute: 0.1 K/uL (ref 0.0–0.5)
Eosinophils Relative: 2 %
HCT: 23.8 % — ABNORMAL LOW (ref 36.0–46.0)
Hemoglobin: 7.7 g/dL — ABNORMAL LOW (ref 12.0–15.0)
Immature Granulocytes: 1 %
Lymphocytes Relative: 29 %
Lymphs Abs: 1.1 K/uL (ref 0.7–4.0)
MCH: 31.6 pg (ref 26.0–34.0)
MCHC: 32.4 g/dL (ref 30.0–36.0)
MCV: 97.5 fL (ref 80.0–100.0)
Monocytes Absolute: 0.5 K/uL (ref 0.1–1.0)
Monocytes Relative: 13 %
Neutro Abs: 2 K/uL (ref 1.7–7.7)
Neutrophils Relative %: 55 %
Platelets: 165 K/uL (ref 150–400)
RBC: 2.44 MIL/uL — ABNORMAL LOW (ref 3.87–5.11)
RDW: 14.6 % (ref 11.5–15.5)
WBC: 3.7 K/uL — ABNORMAL LOW (ref 4.0–10.5)
nRBC: 0 % (ref 0.0–0.2)

## 2024-04-11 LAB — RENAL FUNCTION PANEL
Albumin: 2.4 g/dL — ABNORMAL LOW (ref 3.5–5.0)
Anion gap: 13 (ref 5–15)
BUN: 39 mg/dL — ABNORMAL HIGH (ref 6–20)
CO2: 27 mmol/L (ref 22–32)
Calcium: 11 mg/dL — ABNORMAL HIGH (ref 8.9–10.3)
Chloride: 94 mmol/L — ABNORMAL LOW (ref 98–111)
Creatinine, Ser: 4.1 mg/dL — ABNORMAL HIGH (ref 0.44–1.00)
GFR, Estimated: 13 mL/min — ABNORMAL LOW (ref >60)
Glucose, Bld: 117 mg/dL — ABNORMAL HIGH (ref 70–99)
Phosphorus: 3.3 mg/dL (ref 2.5–4.6)
Potassium: 4.2 mmol/L (ref 3.5–5.1)
Sodium: 134 mmol/L — ABNORMAL LOW (ref 135–145)

## 2024-04-11 LAB — HEPATITIS B CORE ANTIBODY, TOTAL: HEP B CORE AB: NEGATIVE

## 2024-04-13 NOTE — Progress Notes (Signed)
 [facility]  ROUNDING NOTE   Subjective:   Patient underwent hemodialysis treatment last on 04/11/2024.   UF achieved at that time was 1.5 kg.   Patient is parents at bedside.  Objective:  Vital signs in last 24 hours:  Temp:  [96.8 F (36 C)-98.3 F (36.8 C)] 98.3 F (36.8 C) Pulse:  [69-100] 79 Resp:  [13-20] 20 BP: (150-168)/(61-75) 166/65 FiO2 (%):  [21 %] 21 %   Intake/Output: I/O last 3 completed shifts: In: 1901 [NG/GT:1901] Out: 0    Intake/Output this shift:  No intake/output data recorded.  Physical Exam: General: No acute distress  Head: Normocephalic, atraumatic. Moist oral mucosal membranes  Neck: Supple  Lungs:  Clear to auscultation, normal effort  Heart: S1S2 no rubs  Abdomen:  Soft, nontender, bowel sounds present  Extremities: Trace peripheral edema.  Neurologic: Awake, alert, following commands  Skin: No acute rash  Access: Right upper extremity AV fistula    Basic Metabolic Panel: Recent Labs  Lab Units 04/07/24 0000  SERUM CREATININE  4.9    Imaging: @[id number] @   Medications:     Scheduled/Continuous Medications     Scheduled         amLODIPine  (NORVASC ) tablet 10 mg  10 mg, PO/Per Tube, Once a day     aspirin  chewable tablet 81 mg  81 mg, PO/Per Tube, Once a day     [Held by provider] busPIRone  (BUSPAR ) tablet 10 mg On hold since Tue 04/01/2024 at 0804 until manually unheld; held by Corean JONETTA Daring, MDHold Reason: [place] Comment: Not taking at prior facility but on discharge summary.  Will check with provider.  10 mg, PO/Per Tube, BID     Check Patch Placement (And Linked Group #1)  No Dose/Rate, TD, BID     [Held by provider] cinacalcet  (SENSIPAR ) tablet 90 mg On hold since Tue 04/01/2024 at 0804 until manually unheld; held by Corean JONETTA Daring, MDHold Reason: [place]aryle Comment: Not taking at prior facility but on discharge summary.  Will check with provider.  90 mg, PO, Daily with breakfast      cloNIDine  (CATAPRES -TTS) patch 0.3 mg (And Linked Group #1)  0.3 mg, TD, Weekly     cycloSPORINE  modified (NEORAL ) 100 MG/ML enteral solution 200 mg  200 mg, PO, Nightly     cycloSPORINE  modified (NEORAL ) 100 MG/ML enteral solution 225 mg  225 mg, PO, Once a day     famotidine  (PEPCID ) tablet 20 mg  20 mg, PO/Per Tube, Q48H     heparin  (porcine) injection 5,000 Units  5,000 Units, [place], Q12H SCH     hydrALAZINE  (APRESOLINE ) tablet 100 mg  100 mg, PO/Per Tube, Q8H SCH     insulin  lispro injection 0-3 Units (And Linked Group #2)  0-3 Units, [place], Nightly     insulin  lispro injection 0-6 Units (And Linked Group #2)  0-6 Units, [place], TID AC     Lacosamide  (VIMPAT ) tablet 100 mg  100 mg, PO/Per Tube, BID     losartan  (COZAAR ) tablet 100 mg  100 mg, PO/Per Tube, Once a day     methoxy peg-epoetin  beta (MICERA) injection 100 mcg  100 mcg, IV, Q14 Days     metoclopramide  (REGLAN ) tablet 5 mg  5 mg, PO/Per Tube, AC & HS     metoprolol  tartrate (LOPRESSOR ) tablet 100 mg  100 mg, PO/Per Tube, BID     mycophenolate  (CELLCEPT ) tablet 500 mg  500 mg, PO/Per Tube, BID     predniSONE  (DELTASONE ) tablet  15 mg  15 mg, PO/Per Tube, Once a day     renal multivitamin tab/cap 1 each  1 each, PO, Once a day     sevelamer  carbonate (RENVELA ) tablet 2,400 mg  2,400 mg, PO, TID with meals     terazosin (HYTRIN) capsule 5 mg  5 mg, PO, Once a day             .  glucose **OR** juice or non-diet carbonated beverage .  acetaminophen  .  dextrose  **OR** dextrose  .  Erythropoiesis-Stimulating Agent (ESA) per policy .  glucagon  .  hydrALAZINE  .  hydrOXYzine  .  melatonin .  ondansetron  .  oxyCODONE   Assessment/ Plan:  43 y.o. 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   1.  ESRD on HD with history of pancreatic and kidney transplant.  Patient underwent dialysis treatment last on 04/11/2024.  UF achieved at that time was 1.5 kg.  Next dialysis treatment scheduled for tomorrow.   2. Anemia of chronic kidney disease.  Recheck hemoglobin tomorrow.  Continue current dosage of Mircera.   3. Secondary hyperparathyroidism.  Recheck serum phosphorus tomorrow.  Continue Renvela .  4. Hypertension.  Continue amlodipine , clonidine , hydralazine , losartan , terazosin, and metoprolol  for hypertension control.  Blood pressure currently 166/65.   LOS: 12 LATEEF, BONNELL SAILOR, MD 8/10/20253:11 PM EDT

## 2024-04-14 LAB — RENAL FUNCTION PANEL
Albumin: 2.5 g/dL — ABNORMAL LOW (ref 3.5–5.0)
Anion gap: 11 (ref 5–15)
BUN: 64 mg/dL — ABNORMAL HIGH (ref 6–20)
CO2: 26 mmol/L (ref 22–32)
Calcium: 10.7 mg/dL — ABNORMAL HIGH (ref 8.9–10.3)
Chloride: 90 mmol/L — ABNORMAL LOW (ref 98–111)
Creatinine, Ser: 4.84 mg/dL — ABNORMAL HIGH (ref 0.44–1.00)
GFR, Estimated: 11 mL/min — ABNORMAL LOW (ref >60)
Glucose, Bld: 126 mg/dL — ABNORMAL HIGH (ref 70–99)
Phosphorus: 3.9 mg/dL (ref 2.5–4.6)
Potassium: 4.7 mmol/L (ref 3.5–5.1)
Sodium: 127 mmol/L — ABNORMAL LOW (ref 135–145)

## 2024-04-14 LAB — CBC WITH DIFFERENTIAL/PLATELET
Abs Immature Granulocytes: 0.02 K/uL (ref 0.00–0.07)
Basophils Absolute: 0 K/uL (ref 0.0–0.1)
Basophils Relative: 0 %
Eosinophils Absolute: 0.1 K/uL (ref 0.0–0.5)
Eosinophils Relative: 2 %
HCT: 23.1 % — ABNORMAL LOW (ref 36.0–46.0)
Hemoglobin: 7.6 g/dL — ABNORMAL LOW (ref 12.0–15.0)
Immature Granulocytes: 1 %
Lymphocytes Relative: 24 %
Lymphs Abs: 0.8 K/uL (ref 0.7–4.0)
MCH: 31.4 pg (ref 26.0–34.0)
MCHC: 32.9 g/dL (ref 30.0–36.0)
MCV: 95.5 fL (ref 80.0–100.0)
Monocytes Absolute: 0.3 K/uL (ref 0.1–1.0)
Monocytes Relative: 10 %
Neutro Abs: 2.2 K/uL (ref 1.7–7.7)
Neutrophils Relative %: 63 %
Platelets: 137 K/uL — ABNORMAL LOW (ref 150–400)
RBC: 2.42 MIL/uL — ABNORMAL LOW (ref 3.87–5.11)
RDW: 14.6 % (ref 11.5–15.5)
WBC: 3.4 K/uL — ABNORMAL LOW (ref 4.0–10.5)
nRBC: 0 % (ref 0.0–0.2)

## 2024-04-14 NOTE — Progress Notes (Signed)
 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  The patient remains on room air.  She is alert and oriented times 3-4.  She is currently on PEG tube feeds.  OBJECTIVE   Objective  Vital Signs:  BP 109/42   Pulse 81   Temp 97.3 F (36.3 C)   Resp 16   Ht 5' 4 (1.626 m)   Wt 134 lb 7.7 oz (61 kg)   SpO2 97%   BMI 23.08 kg/m    I/O last 24 Hours:  Intake/Output Summary (Last 24 hours) at 04/14/2024 1046 Last data filed at 04/14/2024 0800 Gross per 24 hour  Intake 517 ml  Output 0 ml  Net 517 ml     Physical Exam:  Ill-appearing female sleeping in no obvious distress with mother at the bedside HEENT:  Normocephalic atraumatic, nares  patent, oropharynx is clear  Neck : supple  Cardiovascular : S1-S2 no murmurs rubs or gallops Lungs: decreased BS Abdomen: positive bowel sounds soft peg intact Extremities: no clubbing cyanosis or edema  Neuro: sleeping unable to assess    Labs:  Her labs from 04/14/2024 shows a sodium of 127 potassium 4.7 BUN of 64 and a creatinine of 4.8.  Her calcium  is 10.7.  Her albumin is 2.5.  Her WBC is 3.4 with a hemoglobin of 7.6 and a platelet count of 137.    ASSESSMENT AND PLAN   [name] [name] is a 43 y.o. female  who was admitted on 04/01/2024 with Hypertensive crisis [I16.9] .    Assessment/Plan: Hypertensive SAH, ICH with brain compression, communicating hydrocephalus status post right frontal pressure ventriculostomy via burr hole and EVD, removed on 03/03/2024: Continue  to monitor neuro status.  Patient will need follow up with Neurology as an outpatient  Question of seizure activity post ICH:  Continue patient on Vimpat .  We will continue seizure precautions Patient remaining seizure-free Status post vasospasm with left cerebral infarct on 03/04/2024, LH LDL was 63 hemoglobin A1c was 4.2 we will continue patient on aspirin .   Essential hypertension:  Patient continues on clonidine  patch, Norvasc , Lopressor , hydralazine .  I increased her dose of losartan  up to 100 mg but her blood pressure is still running high.  Nephrology and myself discussed her case and we are going to start her on some terazosin 5 mg daily.  We could also add minoxidil to her.  She has not gone into steady state with her terazosin as of yet and I am not going to start her on another medicine until she goes through 4-5 half lives. Oropharyngeal dysphagia with a history of severe gastroparesis and moderate malnutrition status post G-tube placed by Interventional Radiology.  We will continue patient on Reglan  we will reconsult IR for GJ tube placement after 4 weeks.  The patient had a Cortrak NG tube that was placed post pyloric but we have been feeding the patient through her PEG tube and she seems to be tolerating this well.  She had a Cortrak NG-tube placed successfully in her lower descending  duodenum on 03/21/2024 but had been tolerating PEG tube feeds of a to a few days ago where she started having nausea vomiting.  I am going to re-consult Interventional Radiology to place a GJ tube.  Speech therapy did another swallow evaluation on 04/08/2024 and she had a weak swallow in his not appropriate for oral feeding at this time. Chronic pain syndrome/opiate use disorder: We will continue current pain management .  They can use Tylenol  p.r.n. for headaches. End-stage renal disease on hemodialysis the patient is status post kidney/pancreatic transplant: Continue hemodialysis.  Patient continues on CellCept ,  cyclosporine  and prednisone .  She sat up for dialysis for the 1st time on 04/09/2024 . Recent colitis status post IV antibiotics History of chest pain thought atypical.  Echo revealed an EF of 60 65% with no regional WMA Anemia of chronic disease: We will continue to monitor H&H  Generalized debility:  Continue PT and OT Prevention:  She is on heparin  for DVT prophylaxis and Pepcid  for GI prophylaxis.  Dispo:  We discussed her and our multidisciplinary team meeting on 04/08/2024.  Her mother was updated at bedside on 04/13/2024.  We will continue her current care.    Code Status:  Full Resuscitation    Spent a total of 30 minutes on this encounter. This includes reviewing patient's extensive history, assessment and visit with the patient as well as documentation time. Time spent also includes IDT collaboration.    SIGNATURE   Electronically signed:  VAN EYK, JASON J, MD  04/14/2024 at 10:46 AM EDT

## 2024-04-14 NOTE — Unmapped External Note (Signed)
 Occupational Therapy      Treatment  Patient Name: [name] [name] Patient Birthdate: February 14, 1981   Patient Subjective Report - I'm tired    Pain Assessment Pain Context: Therapy Assessment Prior to Treatment (04/14/24 1451) Pain Assessment: NRS 0-10 (04/14/24 1451) Pain Score: 0 - No pain (04/14/24 1451) Pain Severity - NRS (Calculated): No pain (04/14/24 1451) [place] Agitation Sedation Scale (RASS)/CAM-S: Drowsy (not fully alert, but has sustained awakening to voice (eye opening & contact >10 secs)/lethargic (04/14/24 1451) Vital Signs SpO2: 99 % (04/14/24 1451) O2 Device: None (Room air) (04/14/24 1451) Critical-Care Pain Observation Tool  [place] Agitation Sedation Scale (RASS)/CAM-S: Drowsy (not fully alert, but has sustained awakening to voice (eye opening & contact >10 secs)/lethargic (04/14/24 1451)              ROM:  Yes Other Exercise: PROM R UE at all joints x 3-5.                               Outcomes:    Treatment Outcome:  Tolerated fair   OT Summary Comments:  Pt limited by lethargy today post HD. Pt able to verbalize when asked direct questions but deferred EOB/full theraband HEP education. [name] at bedside and supportive. Demonstrated and education on PROM for RUE to prevent stiffness and decrease swelling, along with elevation with pillow which was placed by OT. [name] reported if pt awakens more later that she plans to try additional exercises with theraband.    OT Summary Plan of Care:  Continue with current plan of care    Number of Caregivers:: 2 person assist Equipment:: Cardiac chair, Lateral transfer assist Transfer Technique: Draw sheet x 2 Wheelchair Cushion: ROHO   Pain Evaluation and Follow-up Pain Reassessment: 0, No pain (04/14/24 1451) Nursing notified of patient's pain assessment: Not indicated - pain score 2 or less (04/14/24 1451)      Therapy Minutes   Enter Time Entry for Time Based Charges Here: Time  In : 1451 Time Out: 1502 Total Time with Patient (Min): 11 min Therapeutic Exercise: 11 Total Time to be Billed (Min): 11     BREWER, JULIE, OT 04/14/2024

## 2024-04-14 NOTE — Unmapped External Note (Signed)
 Problem: Infection  Goal: Absence of infection and prevention of transmission during hospitalization  Outcome: Progressing

## 2024-04-14 NOTE — Unmapped External Note (Signed)
 PT Treatment  Patient Name: [name] [name] Patient Birthdate: [birth year]  Patient Subjective Report -    Pain Assessment Pain Context: Therapy Assessment Prior to Treatment (04/14/24 1138) Pain Assessment: NRS 0-10 (04/14/24 1138) Pain Score: 4 - Moderate Pain (04/14/24 1138) Pain Severity - NRS (Calculated): Moderate (04/14/24 1138) [place] Agitation Sedation Scale (RASS)/CAM-S: Alert and calm (spontaneously pays attention to caregiver) (04/14/24 0700) Critical-Care Pain Observation Tool  [place] Agitation Sedation Scale (RASS)/CAM-S: Alert and calm (spontaneously pays attention to caregiver) (04/14/24 0700)      Cognition:    Overall Cognitive Status:  Impairments impacting function   Alert:  Yes   Oriented to Personal Circumstances:  Yes   Oriented to Place:  Yes   Able to Follow 1 Step Commands:  Yes   Arousal:  Eye opening with stimulation Safety Awareness:    Safety Awareness:  Fair  Mobility:  Bed Mobility:    Sit to Lying:  Moderate assistance Sit to Stand:    Sit to Stand Level of Assistance:  Substantial/maximal assistance  Balance/Activity:  Static Sitting:    Static Sitting Balance Support:  Bilateral upper extremity supported   Static Sitting Level of Assistance:  Contact Guard      Transfer to 1:  Bed Transfer from 1:  Cardiac chair Technique 1:  Stand pivot Transfer Level of Assistance 1:  Maximal Assistance                        Summary:  PT Summary:    PT Summary Comments:  Pt was seen bedside in the am. Pt tolerated out of bed for dialysis. Pt transferred sit to stand with mod A. Pt transferred sit to stand with mod to max A and verbal cues. Dependent with hygiene. Pt transferred cardiac chair to edge of bed with mod to max A and verbal cues. Pt tolerated edge of bed with c/g. Pt transferred edge of bed to supine with mod A. Pt moved up in bed with +2 assist. Pt left sitting up in bed with mother at bedside and bed alarm on.  PT Summary  Plan of Care:    PT Summary Plan of Care:  Continue with current plan of care         Pain Evaluation and Follow-up Pain Reassessment: 4 (04/14/24 1138) Nursing notified of patient's pain assessment: Primary Nurse (04/14/24 1138)   Therapy Minutes   Time In : 1138 Time Out: 1151 Total Time with Patient (Min): 13 min Therapeutic Activities: 13 Total Time to be Billed (Min): [address] JAMES G., PT 04/14/2024

## 2024-04-15 ENCOUNTER — Institutional Professional Consult (permissible substitution) (HOSPITAL_COMMUNITY)

## 2024-04-15 HISTORY — PX: IR GJ TUBE CHANGE: IMG1440

## 2024-04-15 MED ORDER — IOHEXOL 300 MG/ML  SOLN
50.0000 mL | Freq: Once | INTRAMUSCULAR | Status: AC | PRN
  Administered 2024-04-15 (×2): 35 mL

## 2024-04-15 MED ORDER — LIDOCAINE VISCOUS HCL 2 % MT SOLN
15.0000 mL | Freq: Once | OROMUCOSAL | Status: AC
  Administered 2024-04-15 (×2): 5 mL via OROMUCOSAL

## 2024-04-15 NOTE — Procedures (Signed)
 Interventional Radiology Procedure:   Indications: Needs conversion to a GJ tube  Procedure: Conversion of G-tube to a GJ-tube  Findings: 18 Fr GJ tube placed, tip in small bowel   Complications: None     EBL: Minimal  Plan: GJ tube is ready to use.   Mayia Megill R. Philip, MD  Pager: [phone]

## 2024-04-15 NOTE — Consult Note (Signed)
 Chief Complaint: Unable to tolerate G tube feeds. 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 IR placed a 18 Fr push in gastrostomy tube on 7.17.24. Patient continues to have nausea and malnutrition. Team is requesting a G to GJ tube conversion.CT Abd from 7.16.25.  Past Medical History:  Diagnosis Date   Anemia    Anxiety    DM (diabetes mellitus), type 1 (HCC)    ESRD on hemodialysis (HCC)    Gastroparesis    History of simultaneous kidney and pancreas transplant (HCC) 2014   Hypertension    Ischemic cerebrovascular accident (CVA) (HCC) 11/2021   Narcotic abuse (HCC)    Non compliance with medical treatment    Stroke [facility])    Vision loss     Past Surgical History:  Procedure Laterality Date   A/V FISTULAGRAM Right 02/17/2021   Procedure: A/V FISTULAGRAM;  Surgeon: Gretta Lonni PARAS, MD;  Location: [facility] INVASIVE CV LAB;  Service: Cardiovascular;  Laterality: Right;   AV FISTULA PLACEMENT  11/17/2011   Procedure: ARTERIOVENOUS (AV) FISTULA CREATION;  Surgeon: Krystal JULIANNA Doing, MD;  Location: [facility] OR;  Service: Vascular;  Laterality: Right;   BIOPSY  12/03/2021   Procedure: BIOPSY;  Surgeon: Legrand Victory LITTIE DOUGLAS, MD;  Location: [facility] ENDOSCOPY;  Service: Gastroenterology;;   ESOPHAGOGASTRODUODENOSCOPY (EGD) WITH PROPOFOL  N/A 12/03/2021   Procedure: ESOPHAGOGASTRODUODENOSCOPY (EGD) WITH PROPOFOL ;  Surgeon: Legrand Victory LITTIE DOUGLAS, MD;  Location: [facility] ENDOSCOPY;  Service: Gastroenterology;  Laterality: N/A;   EYE SURGERY     HEMATOMA EVACUATION Right 02/25/2021   Procedure:  EVACUATION HEMATOMA RIGHT CHEST;  Surgeon: Sheree Penne Lonni, MD;  Location: [facility] OR;  Service: Vascular;  Laterality: Right;   INSERTION OF DIALYSIS CATHETER Right 12/08/2020   Procedure: INSERTION OF TUNNELED DIALYSIS CATHETER;  Surgeon: Gretta Lonni PARAS, MD;  Location: [facility] OR;  Service: Vascular;  Laterality: Right;   IR ANGIO INTRA EXTRACRAN SEL INTERNAL CAROTID BILAT MOD SED  02/27/2024   IR ANGIO VERTEBRAL SEL VERTEBRAL UNI R MOD SED  02/27/2024   IR GASTROSTOMY TUBE MOD SED  03/20/2024   IR REMOVAL TUN CV CATH W/O FL  05/03/2021   KIDNEY TRANSPLANT     NEPHRECTOMY TRANSPLANTED ORGAN     pancrease transplant     PERIPHERAL VASCULAR BALLOON ANGIOPLASTY Right 02/17/2021   Procedure: PERIPHERAL VASCULAR BALLOON ANGIOPLASTY;  Surgeon: Gretta Lonni PARAS, MD;  Location: [facility] INVASIVE CV LAB;  Service: Cardiovascular;  Laterality: Right;   REFRACTIVE SURGERY     REVISON OF ARTERIOVENOUS FISTULA Right 12/08/2020   Procedure: RIGHT ARM ARTERIOVENOUS FISTULA REVISION AND RESECTION;  Surgeon: Gretta Lonni PARAS, MD;  Location: [facility] OR;  Service: Vascular;  Laterality: Right;   REVISON OF ARTERIOVENOUS FISTULA Right 02/25/2021   Procedure: RIGHT ARM ARTERIOVENOUS FISTULA REVISION WITH TRANSPOSITION OF CEPHALIC VEIN ON AXILLARY VEIN;  Surgeon: Gretta Lonni PARAS, MD;  Location: [facility] OR;  Service: Vascular;  Laterality: Right;    Allergies: Patient has no known allergies.  Medications: Prior to Admission medications   Medication Sig Start Date End Date Taking? Authorizing Provider  albuterol  (PROVENTIL ) (2.5 MG/3ML) 0.083% nebulizer solution Take 3 mLs (  2.5 mg total) by nebulization every 4 (four) hours as needed for wheezing or shortness of breath. 03/31/24   Christobal Guadalajara, MD  amLODipine  (NORVASC ) 10 MG tablet Take 1 tablet (10 mg total) by mouth daily. 02/05/22 11/06/23  Arlice Reichert, MD  artificial tears ophthalmic solution Place 1 drop into the left eye as needed for dry eyes. 03/31/24   Christobal Guadalajara,  MD  aspirin  EC 81 MG tablet Chew 81 mg by mouth daily.    [provider]  busPIRone  (BUSPAR ) 10 MG tablet Take 10 mg by mouth 2 (two) times daily. 10/25/23   [provider]  cinacalcet  (SENSIPAR ) 30 MG tablet Take 3 tablets (90 mg total) by mouth daily with supper. 06/14/23   Amin, Sumayya, MD  cloNIDine  (CATAPRES  - DOSED IN MG/24 HR) 0.3 mg/24hr patch Place 1 patch (0.3 mg total) onto the skin once a week. 04/04/24   Christobal Guadalajara, MD  cycloSPORINE  (SANDIMMUNE ) 25 MG capsule Take 25 mg by mouth in the morning.    [provider]  cycloSPORINE  modified (NEORAL ) 100 MG capsule Take 2 capsules (200 mg total) by mouth 2 (two) times daily. 12/25/21   Pokhrel, Laxman, MD  Darbepoetin Alfa  (ARANESP ) 60 MCG/0.3ML SOSY injection Inject 0.3 mLs (60 mcg total) into the vein every Friday with hemodialysis. 11/18/21   Danton Reyes DASEN, MD  doxercalciferol  (HECTOROL ) 4 MCG/2ML injection Inject 3 mLs (6 mcg total) into the vein every Monday, Wednesday, and Friday with hemodialysis. 11/17/21   Danton Reyes DASEN, MD  DULoxetine  (CYMBALTA ) 20 MG capsule Take 1 capsule (20 mg total) by mouth daily. 02/06/22 11/06/23  Arlice Reichert, MD  ferric citrate  (AURYXIA ) 1 GM 210 MG(Fe) tablet Take 210-630 mg by mouth See admin instructions. Take 3 tablets (630 mg) by mouth three times daily with meals, and take 1 tablet (210 mg) twice daily with snacks    [provider]  gabapentin  (NEURONTIN ) 100 MG capsule Take 100 mg by mouth 2 (two) times daily. 10/25/23   [provider]  hydrALAZINE  (APRESOLINE ) 25 MG tablet Take 3 tablets (75 mg total) by mouth every 8 (eight) hours. 02/05/22 11/06/23  Arlice Reichert, MD  hydrOXYzine  (VISTARIL ) 25 MG capsule Take 25 mg by mouth 3 (three) times daily as needed for anxiety. 05/26/23   [provider]  lacosamide  100 MG TABS Take 1 tablet (100 mg total) by mouth 2 (two) times daily. 03/31/24   Christobal Guadalajara, MD  lidocaine -prilocaine  (EMLA ) cream Apply 1  application  topically daily as needed (prior to port access). 07/09/20   [provider]  MELATONIN PO Take 1 tablet by mouth at bedtime.    [provider]  metoCLOPramide  (REGLAN ) 5 MG tablet Take 5 mg by mouth 2 (two) times daily before a meal.    [provider]  metoprolol  tartrate (LOPRESSOR ) 50 MG tablet Take 1 tablet (50 mg total) by mouth 2 (two) times daily. 03/31/24   Christobal Guadalajara, MD  multivitamin (RENA-VIT) TABS tablet Take 1 tablet by mouth at bedtime. 03/31/24   Christobal Guadalajara, MD  mycophenolate  (CELLCEPT ) 200 mg/mL oral suspension Take 2.5 mLs (500 mg total) by mouth 2 (two) times daily. 03/31/24   Christobal Guadalajara, MD  Nutritional Supplements (FEEDING SUPPLEMENT, NEPRO CARB STEADY,) LIQD Place 1,000 mLs into feeding tube continuous. 03/31/24   Christobal Guadalajara, MD  Nystatin (GERHARDT'S BUTT CREAM) CREA Apply 1 Application topically as needed for irritation. 03/31/24   Christobal Guadalajara, MD  ondansetron  (ZOFRAN ) 4 MG tablet Take  4 mg by mouth every 8 (eight) hours as needed for nausea or vomiting.    [provider]  oxyCODONE -acetaminophen  (PERCOCET/ROXICET) 5-325 MG tablet Take 1 tablet by mouth 3 (three) times daily.    [provider]  predniSONE  (DELTASONE ) 5 MG tablet Take 15 mg by mouth daily. 06/24/15   [provider]  sevelamer  carbonate (RENVELA ) 2.4 g PACK Take 2.4 g by mouth 3 (three) times daily with meals. 03/31/24   Christobal Guadalajara, MD  simethicone  (MYLICON) 40 MG/0.6ML drops Take 0.6 mLs (40 mg total) by mouth 4 (four) times daily as needed for flatulence. 03/31/24   Christobal Guadalajara, MD  XPHOZAH 30 MG TABS Take 1 tablet by mouth 2 (two) times daily. 06/21/23   [provider]  zolpidem  (AMBIEN ) 10 MG tablet Take 5 mg by mouth at bedtime as needed for sleep.    [provider]     Family History  Problem Relation Age of Onset   Hypertension Father     Social History   Socioeconomic History   Marital status: Married    Spouse  name: Not on file   Number of children: Not on file   Years of education: Not on file   Highest education level: Not on file  Occupational History   Not on file  Tobacco Use   Smoking status: Former    Types: Pipe   Smokeless tobacco: Never   Tobacco comments:    Hooka  Vaping Use   Vaping status: Never Used  Substance and Sexual Activity   Alcohol  use: No   Drug use: No   Sexual activity: Not Currently  Other Topics Concern   Not on file  Social History Narrative   Worked for [place]  [employer]--as did husband. Had to leave [place] for safety reasons. Has been in [place]  since 9/09.   Social Drivers of Corporate investment banker Strain: Not on file  Food Insecurity: Patient Unable To Answer (02/15/2024)   Hunger Vital Sign    Worried About Running Out of Food in the Last Year: Patient unable to answer    Ran Out of Food in the Last Year: Patient unable to answer  Transportation Needs: No Transportation Needs (04/01/2024)   Received from [facility]   [facility] SDOH Transportation Source    Has lack of transportation kept you from medical appointments or from getting medications?: No    Has lack of transportation kept you from meetings, work, or from getting things needed for daily living?: No  Physical Activity: Not on file  Stress: Patient Declined (04/01/2024)   Received from [facility]   [employer] of Occupational Health - Occupational Stress Questionnaire    Feeling of Stress : Patient declined  Social Connections: Unknown (10/04/2022)   Received from [employer]   Social Network    Social Network: Not on file    Review of Systems: A 12 point ROS discussed and pertinent positives are indicated in the HPI above.  All other systems are negative.    Vital Signs: LMP 02/13/2024 (Exact Date)     Physical Exam Vitals and nursing note reviewed.  Constitutional:      General: She is not in acute distress.    Appearance: Normal appearance.     Comments: Resting comfortably  in bed  HENT:     Mouth/Throat:     Mouth: Mucous membranes are moist.     Pharynx: Oropharynx is clear.  Cardiovascular:     Rate and  Rhythm: Normal rate and regular rhythm.  Pulmonary:     Effort: Pulmonary effort is normal.     Breath sounds: Normal breath sounds.  Abdominal:     Palpations: Abdomen is soft.     Comments: + ttp surrounding gtube site  Musculoskeletal:     Right lower leg: No edema.     Left lower leg: No edema.  Skin:    General: Skin is warm and dry.  Neurological:     Mental Status: She is alert and oriented to person, place, and time. Mental status is at baseline.     Imaging: DG ABDOMEN PEG TUBE LOCATION Result Date: 04/01/2024 CLINICAL DATA:  [id number] PEG (percutaneous endoscopic gastrostomy) status (HCC) [id number] EXAM: ABDOMEN - 1 VIEW COMPARISON:  March 28, 2024 FINDINGS: The patient received 30 ml of Gastrografin  contrast via the gastrostomy tube. Small bore esophagogastric tube is coiled within the stomach terminating in the gastric antrum. Percutaneous gastrostomy tube overlying the distal stomach. After the hand injection of enteric contrast, there is opacification of the stomach rugae. Nonobstructive bowel gas pattern. IMPRESSION: Percutaneous gastrostomy tube terminates in the stomach. Electronically Signed   By: Rogelia Myers M.D.   On: 04/01/2024 10:18   DG Chest Port 1 View Result Date: 03/29/2024 CLINICAL DATA:  Fever. EXAM: PORTABLE CHEST 1 VIEW COMPARISON:  Chest radiograph dated 02/21/2024. FINDINGS: Enteric tube extends below the diaphragm with tip beyond the inferior margin of the image. There is mild cardiomegaly. Bilateral interstitial densities may represent vascular congestion. Pneumonia is not excluded. No consolidative changes. There is no pleural effusion or pneumothorax. No acute osseous pathology. A 2 cm calcified nodule in the right breast as seen previously. Correlation with physical exam recommended. IMPRESSION: Mild cardiomegaly with  mild vascular congestion. Pneumonia is not excluded. Electronically Signed   By: Vanetta Chou M.D.   On: 03/29/2024 10:13   DG Abd 1 View Result Date: 03/28/2024 EXAM: 1 VIEW XRAY OF THE ABDOMEN 03/28/2024 08:18:40 AM COMPARISON: None available. CLINICAL HISTORY: [id number] Encounter for care related to feeding tube [id number]. 1 min 48 sec fluoro 8.3 MGY feeding tube adjusted by ede. FINDINGS: BOWEL: Nonobstructive bowel gas pattern. Contrast injected is contained distal duodenum and proximal jejunum. No evidence for leak is present. SOFT TISSUES: No abnormal calcifications or opaque urinary calculi. BONES: No acute osseous abnormality. IMPRESSION: 1. No evidence of leak. Electronically signed by: Lonni Necessary MD 03/28/2024 08:25 AM EDT RP Workstation: [id number]   DG Abd Portable 1V Result Date: 03/27/2024 CLINICAL DATA:  Nasogastric tube placement. EXAM: PORTABLE ABDOMEN - 1 VIEW COMPARISON:  None Available. FINDINGS: Distal tip of enteric tube is seen in expected position of distal stomach. Gastrostomy tube is again noted. IMPRESSION: Distal tip of enteric tube is seen in expected position of distal stomach. Electronically Signed   By: Lynwood Landy Raddle M.D.   On: 03/27/2024 12:38   DG Naso/Oro Danetta Thru Duo-Reposition Result Date: 03/23/2024 INDICATION: 43 year old female with ICH, hydrocephalus, severe gastroparesis, and previous successful post pyloric Dobbhoff placement on 03/21/2024. Team reporting unable to use Dobbhoff and meeting resistance with flushing. Request for evaluation and possible repositioning. EXAM: IR NASO/ORO GTUBE THRU DUO - REPOSITION FLUOROSCOPY TIME:  Radiation Exposure Index (as provided by the fluoroscopic device): 11 mGy Kerma COMPLICATIONS: None immediate PROCEDURE: Initial imaging revealed the Dobbhoff was kinked within the small bowel likely contributing to resistance with use. The Dobhoff tube was subsequently lubricated with viscous lidocaine  inserted into the right  nostril. Under intermittent fluoroscopic guidance,  approximately 12 cm of the Dobhoff tube was gently retracted through the right nare until the Dobbhoff was no longer kinked. Approximately 15 mL of Omnipaque  300 was flushed through the tube without meeting resistance. Contrast was noted in the duodenum with a small amount of reflux into the stomach. The tube was then flushed with approximately 5 mL of water . The tip remains post pyloric. A spot fluoroscopic image was saved for documentation purposes. The tube was affixed to the patient's nose with a bridle. The patient tolerated the procedure well without immediate postprocedural complication. IMPRESSION: Successful fluoroscopic guided repositioning of Dobhoff tube with tip terminating over the duodenojejunal junction. The tube is ready for immediate use. This exam was performed by [facility] PA-C, and was supervised and interpreted by Dr. DELENA Balder. Electronically Signed   By: Juliene Balder M.D.   On: 03/23/2024 10:48   DG Lu[place]ana MATSU Tube Plc W/Fl W/Rad Result Date: 03/21/2024 INDICATION: Patient is in the hospital with complications from hydrocephalus, ICH. Severe gastroparesis. Patient had G tube placed earlier today and patient pulled her prior post pyloric NG tube after. Unsuccessful post pyloric NG tube placement yesterday. Consult for re-attempt today. MEDICATIONS: 4 ml 2% viscous lidocaine .  35 mL Omnipaque  300 ANESTHESIA/SEDATION: None CONTRAST:  35 mL Omnipaque  300 - administered into the stomach and proximal duodenum. FLUOROSCOPY: Radiation Exposure Index (as provided by the fluoroscopic device): 101.50 mGy Kerma COMPLICATIONS: None immediate. PROCEDURE: Informed written consent was obtained after a thorough discussion of the procedural risks, benefits and alternatives. All questions were addressed. Maximal Sterile Barrier Technique was utilized including caps, mask, sterile gowns, sterile gloves, hand hygiene and skin antiseptic. A timeout was performed  prior to the initiation of the procedure. Nasogastric tube remained in the stomach advanced to the level of the pylorus from placement attempt yesterday. Viscous lidocaine  was initially given intranasally for pain relief while manipulating NG tube. Guidewire was introduced through the NG tube and was able to be advanced through the pylorus into the duodenum after multiple attempts. The NG tube was advanced over the guidewire into this space. Contrast was then administered with contrast seen traveling through the proximal small bowel showing successful post-pyloric placement in the lower descending duodenum. Guidewire was removed and NG tube was secured in place. Images saved. IMPRESSION: Successful post-pyloric nasogastric tube placement with tip in the lower descending duodenum. Performed by: Damien Batty, PA-C Supervised by: Selinda Blue, MD Electronically Signed   By: Selinda DELENA Blue M.D.   On: 03/21/2024 12:51   DG Luwana MATSU Tube Plc W/Fl W/Rad Result Date: 03/21/2024 INDICATION: Patient is in the hospital with complications from hydrocephalus, ICH. Significant gastroparesis. Patient had G tube placed earlier today and patient pulled her prior post pyloric NG tube after. IR consulted to re-attempt placement of post pyloric NG tube. MEDICATIONS: 10 ml 2% viscous lidocaine .  50 mL Omnipaque  300; ANESTHESIA/SEDATION: None CONTRAST:  50 mL Omnipaque  300 - administered into the stomach FLUOROSCOPY: Radiation Exposure Index (as provided by the fluoroscopic device): 134.60 mGy Kerma COMPLICATIONS: None immediate. PROCEDURE: Informed written consent was obtained after a thorough discussion of the procedural risks, benefits and alternatives. All questions were addressed. Maximal Sterile Barrier Technique was utilized including caps, mask, sterile gowns, sterile gloves, sterile drape, hand hygiene and skin antiseptic. A timeout was performed prior to the initiation of the procedure. IR tech had advanced a 10 Fr NG tube into  the stomach upon my arrival to the room. Multiple attempts at advancing the guidewire and  nasogastric tube were made. Contrast was administered and showed a significantly curved transition from the pylorus to the duodenum, further complicating passage of the guidewire and NG tube. Patient was moved to multiple positions and both air and 50 mL of water  was given to try and open the pylorus. Ultimately unsuccessful at passing the wire or nasogastric tube into the post pyloric space. NG tube was left in the stomach at the level of the pylorus. IMPRESSION: *Unsuccessful placement of post-pyloric nasogastric tube. The nasogastric tube was left in the stomach at the level of pylorus. Performed by: Jailani Hogans, PA-C Electronically Signed   By: Ree Molt M.D.   On: 03/21/2024 11:23   IR GASTROSTOMY TUBE MOD SED Result Date: 03/20/2024 INDICATION: Dysphagia. EXAM: PERCUTANEOUS GASTROSTOMY TUBE PLACEMENT COMPARISON:  CTA was, 03/19/2024 MEDICATIONS: Ancef  2 gm IV; Antibiotics were administered within 1 hour of the procedure. 0.5 mg glucagon  IV CONTRAST:  20 mL of Isovue  300 administered into the gastric lumen. ANESTHESIA/SEDATION: Moderate (conscious) sedation was employed during this procedure. A total of Versed  1.5 mg and Fentanyl  100 mcg was administered intravenously. Moderate Sedation Time: 22 minutes. The patient's level of consciousness and vital signs were monitored continuously by radiology nursing throughout the procedure under my direct supervision. FLUOROSCOPY: Radiation Exposure Index and estimated peak skin dose (PSD); Reference air kerma (RAK), 1.1 mGy. Kerma-area product (KAP), 24.1 uGy*m. COMPLICATIONS: None immediate. PROCEDURE: Informed written consent was obtained from the patient and/or patient's representative following explanation of the procedure, risks, benefits and alternatives. A time out was performed prior to the initiation of the procedure. Maximal barrier sterile technique utilized  including caps, mask, sterile gowns, sterile gloves, large sterile drape, hand hygiene and sterile prep. The LEFT costal margin and barium opacified transverse colon were identified and avoided. Air was injected into the stomach for insufflation and visualization under fluoroscopy. Under sterile conditions and local anesthesia, 2 T tacks were utilized to pexy the anterior aspect of the stomach against the ventral abdominal wall. Contrast injection confirmed appropriate positioning of each of the T tacks. An incision was made between the T tacks and a 17 gauge trocar needle was utilized to access the stomach. Needle position was confirmed within the stomach with aspiration of air and injection of a small amount of contrast. A stiff guidewire was advanced into the gastric lumen and under intermittent fluoroscopic guidance, the access needle was exchanged for a telescoping peel-away sheath, ultimately allowing placement of a 18 Fr balloon retention gastrostomy tube. The retention balloon was insufflated with a mixture of dilute saline and contrast and pulled taut against the anterior wall of the stomach. The external disc was cinched. Contrast injection confirms positioning within the stomach. Several spot radiographic images were obtained in various obliquities for documentation. The patient tolerated procedure well without immediate post procedural complication. FINDINGS: After successful fluoroscopic guided placement, the gastrostomy tube is appropriately positioned with internal retention balloon against the ventral aspect of the gastric lumen. IMPRESSION: Successful fluoroscopic insertion of a 18 Fr balloon retention gastrostomy tube. The gastrostomy may be used immediately for medication administration and in 4 hrs for the initiation of feeds. RECOMMENDATIONS: The patient will return to Vascular Interventional Radiology (VIR) for routine feeding tube evaluation and exchange in 6 months. Thom Hall, MD [facility] Electronically Signed   By: Thom Hall M.D.   On: 03/20/2024 17:17   CT ABDOMEN WO CONTRAST Result Date: 03/19/2024 CLINICAL DATA:  Evaluation of operative window for  G-J tube placement in IR EXAM: CT ABDOMEN WITHOUT CONTRAST TECHNIQUE: Multidetector CT imaging of the abdomen was performed following the standard protocol without IV contrast. RADIATION DOSE REDUCTION: This exam was performed according to the departmental dose-optimization program which includes automated exposure control, adjustment of the mA and/or kV according to patient size and/or use of iterative reconstruction technique. COMPARISON:  02/13/2024 FINDINGS: Lower chest: Dependent bibasilar opacities compatible with atelectasis. No effusions. Mild cardiomegaly. Coronary artery and aortic atherosclerosis. Hepatobiliary: Small layering gallstones within the gallbladder. No biliary ductal dilatation or focal hepatic abnormality. Pancreas: No focal abnormality or ductal dilatation. Spleen: Splenomegaly with a craniocaudal length 14.7 cm. Scattered calcifications throughout the spleen. No suspicious focal abnormality. Adrenals/Urinary Tract: Adrenal glands normal. Atrophic kidneys with diffuse renovascular calcifications. No suspicious focal renal abnormality or hydronephrosis. Stomach/Bowel: Feeding tube coils in the fundus of the stomach with the tip in the distal stomach. Stomach, visualized large and small bowel decompressed. No bowel obstruction or inflammatory process. Clear operative window to the mid/distal stomach present. Vascular/Lymphatic: Aortic atherosclerosis. No evidence of aneurysm or adenopathy. Other: No free fluid or free air. Musculoskeletal: No acute bony abnormality. IMPRESSION: Splenomegaly. Cholelithiasis.  No CT evidence of acute cholecystitis. Bilateral atrophic kidneys. Heavily calcified aorta and branch vessels. No acute findings. Electronically Signed   By:  Franky Crease M.D.   On: 03/19/2024 11:43   ECHOCARDIOGRAM LIMITED Result Date: 03/16/2024    ECHOCARDIOGRAM LIMITED REPORT   Patient Name:   Lashawnta RUTHA MELGOZA Date of Exam: 03/16/2024 Medical Rec #:  [id number]          Height:       67.5 in Accession #:    [id number]         Weight:       114.6 lb Date of Birth:  06/12/1981           BSA:          1.605 m Patient Age:    43 years           BP:           151/68 mmHg Patient Gender: F                  HR:           100 bpm. Exam Location:  Inpatient Procedure: Limited Echo, Limited Color Doppler and Cardiac Doppler (Both            Spectral and Color Flow Doppler were utilized during procedure). Indications:    Chest Pain  History:        Patient has prior history of Echocardiogram examinations.                 Signs/Symptoms:Chest Pain.  Sonographer:    Vella Key Referring Phys: [id number] OMAIR LATIF [facility] IMPRESSIONS  1. Left ventricular ejection fraction, by estimation, is 60 to 65%. The left ventricle has normal function. The left ventricle has no regional wall motion abnormalities. There is mild left ventricular hypertrophy. Left ventricular diastolic parameters are indeterminate.  2. Right ventricular systolic function is normal. The right ventricular size is normal.  3. A small pericardial effusion is present.  4. Trivial mitral valve regurgitation.  5. The aortic valve is tricuspid. Aortic valve regurgitation is not visualized. Aortic valve sclerosis/calcification is present, without any evidence of aortic stenosis.  6. The inferior vena cava is normal in size with greater than 50% respiratory variability, suggesting right atrial pressure of 3 mmHg. FINDINGS  Left Ventricle: Left ventricular ejection fraction, by estimation, is 60 to 65%. The left ventricle has normal function. The left ventricle has no regional wall motion abnormalities. The left ventricular internal cavity size was normal in size. There is  mild left ventricular hypertrophy. Left  ventricular diastolic parameters are indeterminate. Right Ventricle: The right ventricular size is normal. Right vetricular wall thickness was not assessed. Right ventricular systolic function is normal. Left Atrium: Left atrial size was normal in size. Right Atrium: Right atrial size was normal in size. Pericardium: A small pericardial effusion is present. Mitral Valve: There is mild thickening of the mitral valve leaflet(s). Mild to moderate mitral annular calcification. Trivial mitral valve regurgitation. Tricuspid Valve: The tricuspid valve is normal in structure. Aortic Valve: The aortic valve is tricuspid. Aortic valve regurgitation is not visualized. Aortic valve sclerosis/calcification is present, without any evidence of aortic stenosis. Pulmonic Valve: The pulmonic valve was normal in structure. Pulmonic valve regurgitation is not visualized. Aorta: The aortic root and ascending aorta are structurally normal, with no evidence of dilitation. Venous: The inferior vena cava is normal in size with greater than 50% respiratory variability, suggesting right atrial pressure of 3 mmHg. IAS/Shunts: No atrial level shunt detected by color flow Doppler. LEFT VENTRICLE PLAX 2D LVIDd:         3.80 cm LVIDs:         2.60 cm LV PW:         1.20 cm LV IVS:        1.20 cm LVOT diam:     1.60 cm LV SV:         45 LV SV Index:   28 LVOT Area:     2.01 cm  LV Volumes (MOD) LV vol d, MOD A2C: 83.7 ml LV vol d, MOD A4C: 98.2 ml LV vol s, MOD A2C: 28.2 ml LV vol s, MOD A4C: 35.2 ml LV SV MOD A2C:     55.5 ml LV SV MOD A4C:     98.2 ml LV SV MOD BP:      61.3 ml LEFT ATRIUM           Index LA diam:      3.30 cm 2.06 cm/m LA Vol (A4C): 46.6 ml 29.03 ml/m  AORTIC VALVE LVOT Vmax:   125.00 cm/s LVOT Vmean:  91.800 cm/s LVOT VTI:    0.225 m  AORTA Ao Root diam: 2.60 cm  SHUNTS Systemic VTI:  0.22 m Systemic Diam: 1.60 cm Vina Gull MD Electronically signed by Vina Gull MD Signature Date/Time: 03/16/2024/5:27:59 PM    Final      Labs:  CBC: Recent Labs    04/07/24 0455 04/09/24 0419 04/11/24 0421 04/14/24 0452  WBC 5.1 4.3 3.7* 3.4*  HGB 7.9* 8.0* 7.7* 7.6*  HCT 24.9* 24.8* 23.8* 23.1*  PLT 200 174 165 137*    COAGS: Recent Labs    06/13/23 1345 02/14/24 1741 02/20/24 2221  INR 1.2 1.3* 1.3*  APTT 31 28 29     BMP: Recent Labs    04/09/24 0419 04/10/24 0421 04/11/24 0421 04/14/24 0611  NA 132* 134* 134* 127*  K 4.0 4.0 4.2 4.7  CL 93* 96* 94* 90*  CO2 26 28 27 26   GLUCOSE 127* 110* 117* 126*  BUN 49* 26* 39* 64*  CALCIUM  10.8* 10.4* 11.0* 10.7*  CREATININE 4.14* 2.83* 4.10* 4.84*  GFRNONAA 13* 21* 13* 11*    LIVER FUNCTION TESTS: Recent Labs    03/17/24 0542 03/19/24  0448 03/20/24 0540 04/01/24 0845 04/03/24 1528 04/09/24 0419 04/10/24 0421 04/11/24 0421 04/14/24 0611  BILITOT 0.6 0.5  --  0.2  --   --  0.3  --   --   AST 17 20  --  17  --   --  25  --   --   ALT 11 11  --  9  --   --  28  --   --   ALKPHOS 114 111  --  89  --   --  118  --   --   PROT 6.3* 6.3*  --  6.0*  --   --  5.4*  --   --   ALBUMIN 2.8* 2.8*   < > 2.3*   < > 2.5* 2.3* 2.4* 2.5*   < > = values in this interval not displayed.    TUMOR MARKERS: No results for input(s): AFPTM, CEA, CA199, CHROMGRNA in the last 8760 hours.  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 IR placed a 18 Fr push in gastrostomy tube on 7.17.24. Patient continues to have nausea and malnutrition. Team is requesting a G to GJ tube conversion.  PLAN: IR Image Guided G to GJ Conversion  Risks and benefits image guided gastrostomy tube placement was discussed with the patient including, but not limited to the need for a barium enema during the procedure, bleeding, infection, peritonitis and/or damage to adjacent  structures.  All of the patient's questions were answered, patient is agreeable to proceed.  Consent signed and in chart.  Thank you for this interesting consult.  I greatly enjoyed meeting [name] [name] and look forward to participating in their care.  A copy of this report was sent to the requesting provider on this date.  Electronically Signed: Kimble VEAR Clas, PA-C 04/15/2024, 11:56 AM   I spent a total of 40 Minutes    in face to face in clinical consultation, greater than 50% of which was counseling/coordinating care for gastrostomy to gastrojejunal tube conversion.

## 2024-04-16 LAB — RENAL FUNCTION PANEL
Albumin: 2.4 g/dL — ABNORMAL LOW (ref 3.5–5.0)
Anion gap: 10 (ref 5–15)
BUN: 58 mg/dL — ABNORMAL HIGH (ref 6–20)
CO2: 25 mmol/L (ref 22–32)
Calcium: 10.6 mg/dL — ABNORMAL HIGH (ref 8.9–10.3)
Chloride: 93 mmol/L — ABNORMAL LOW (ref 98–111)
Creatinine, Ser: 4.45 mg/dL — ABNORMAL HIGH (ref 0.44–1.00)
GFR, Estimated: 12 mL/min — ABNORMAL LOW (ref >60)
Glucose, Bld: 99 mg/dL (ref 70–99)
Phosphorus: 4.2 mg/dL (ref 2.5–4.6)
Potassium: 5 mmol/L (ref 3.5–5.1)
Sodium: 128 mmol/L — ABNORMAL LOW (ref 135–145)

## 2024-04-16 LAB — CBC WITH DIFFERENTIAL/PLATELET
Abs Immature Granulocytes: 0.01 K/uL (ref 0.00–0.07)
Basophils Absolute: 0 K/uL (ref 0.0–0.1)
Basophils Relative: 1 %
Eosinophils Absolute: 0.1 K/uL (ref 0.0–0.5)
Eosinophils Relative: 2 %
HCT: 23.4 % — ABNORMAL LOW (ref 36.0–46.0)
Hemoglobin: 7.7 g/dL — ABNORMAL LOW (ref 12.0–15.0)
Immature Granulocytes: 0 %
Lymphocytes Relative: 20 %
Lymphs Abs: 0.9 K/uL (ref 0.7–4.0)
MCH: 31.7 pg (ref 26.0–34.0)
MCHC: 32.9 g/dL (ref 30.0–36.0)
MCV: 96.3 fL (ref 80.0–100.0)
Monocytes Absolute: 0.4 K/uL (ref 0.1–1.0)
Monocytes Relative: 10 %
Neutro Abs: 3 K/uL (ref 1.7–7.7)
Neutrophils Relative %: 67 %
Platelets: 122 K/uL — ABNORMAL LOW (ref 150–400)
RBC: 2.43 MIL/uL — ABNORMAL LOW (ref 3.87–5.11)
RDW: 14.6 % (ref 11.5–15.5)
WBC: 4.4 K/uL (ref 4.0–10.5)
nRBC: 0 % (ref 0.0–0.2)

## 2024-04-22 ENCOUNTER — Other Ambulatory Visit: Payer: Self-pay

## 2024-04-22 ENCOUNTER — Encounter (HOSPITAL_COMMUNITY): Payer: Self-pay | Admitting: *Deleted

## 2024-04-22 ENCOUNTER — Encounter (HOSPITAL_COMMUNITY): Payer: Self-pay

## 2024-04-22 ENCOUNTER — Emergency Department (HOSPITAL_COMMUNITY)
Admission: EM | Admit: 2024-04-22 | Discharge: 2024-04-22 | Disposition: A | Discharge status: Home / Self Care | Attending: Emergency Medicine | Admitting: Emergency Medicine

## 2024-04-22 DIAGNOSIS — Z7982 Long term (current) use of aspirin: Secondary | ICD-10-CM | POA: Insufficient documentation

## 2024-04-22 DIAGNOSIS — Z79899 Other long term (current) drug therapy: Secondary | ICD-10-CM | POA: Diagnosis not present

## 2024-04-22 DIAGNOSIS — K9423 Gastrostomy malfunction: Secondary | ICD-10-CM | POA: Diagnosis present

## 2024-04-22 DIAGNOSIS — Z431 Encounter for attention to gastrostomy: Secondary | ICD-10-CM | POA: Diagnosis not present

## 2024-04-22 DIAGNOSIS — R112 Nausea with vomiting, unspecified: Secondary | ICD-10-CM | POA: Insufficient documentation

## 2024-04-22 DIAGNOSIS — Z931 Gastrostomy status: Secondary | ICD-10-CM

## 2024-04-22 NOTE — ED Notes (Signed)
 [facility] called for transport back to [facility].

## 2024-04-22 NOTE — ED Notes (Signed)
 Pts Gtube was clogged. With use of water , and repeated Push-Pull, pts Gtube was unclogged. Both Gastric and Jejunal ports flush easily.   Fluid returns freely when uncapped.

## 2024-04-22 NOTE — Discharge Instructions (Addendum)
 Patient's GJ tube was successfully unclogged and flushed in the ED. Please continue using GJ tube as usual.

## 2024-04-22 NOTE — ED Notes (Signed)
 Report called to [facility]. Spoke handoff given to Vernell, LPN Unit 1 Manager.

## 2024-04-22 NOTE — ED Provider Notes (Signed)
 [place] EMERGENCY DEPARTMENT AT [facility] Provider Note   CSN: [id number] Arrival date & time: 04/22/24  1206   Patient presents with: G tube clogged  [name] [name] is a 43 y.o. female sent by nursing home for clogged tube.   Per patient, tube clogged earlier today. Reports nausea and vomiting. Denies fever, diarrhea.   Per chart review, G tube converted to 18 Fr GJ tube on 04/15/24 by IR Dr Juliene FABIENE Balder.     Prior to Admission medications   Medication Sig Start Date End Date Taking? Authorizing Provider  albuterol  (PROVENTIL ) (2.5 MG/3ML) 0.083% nebulizer solution Take 3 mLs (2.5 mg total) by nebulization every 4 (four) hours as needed for wheezing or shortness of breath. 03/31/24   Christobal Guadalajara, MD  amLODipine  (NORVASC ) 10 MG tablet Take 1 tablet (10 mg total) by mouth daily. 02/05/22 11/06/23  Arlice Reichert, MD  artificial tears ophthalmic solution Place 1 drop into the left eye as needed for dry eyes. 03/31/24   Christobal Guadalajara, MD  aspirin  EC 81 MG tablet Chew 81 mg by mouth daily.    [provider]  busPIRone  (BUSPAR ) 10 MG tablet Take 10 mg by mouth 2 (two) times daily. 10/25/23   [provider]  cinacalcet  (SENSIPAR ) 30 MG tablet Take 3 tablets (90 mg total) by mouth daily with supper. 06/14/23   Amin, Sumayya, MD  cloNIDine  (CATAPRES  - DOSED IN MG/24 HR) 0.3 mg/24hr patch Place 1 patch (0.3 mg total) onto the skin once a week. 04/04/24   Christobal Guadalajara, MD  cycloSPORINE  (SANDIMMUNE ) 25 MG capsule Take 25 mg by mouth in the morning.    [provider]  cycloSPORINE  modified (NEORAL ) 100 MG capsule Take 2 capsules (200 mg total) by mouth 2 (two) times daily. 12/25/21   Pokhrel, Laxman, MD  Darbepoetin Alfa  (ARANESP ) 60 MCG/0.3ML SOSY injection Inject 0.3 mLs (60 mcg total) into the vein every Friday with hemodialysis. 11/18/21   Danton Reyes DASEN, MD  doxercalciferol  (HECTOROL ) 4 MCG/2ML injection Inject 3 mLs (6 mcg total) into the vein every Monday,  Wednesday, and Friday with hemodialysis. 11/17/21   Danton Reyes DASEN, MD  DULoxetine  (CYMBALTA ) 20 MG capsule Take 1 capsule (20 mg total) by mouth daily. 02/06/22 11/06/23  Arlice Reichert, MD  ferric citrate  (AURYXIA ) 1 GM 210 MG(Fe) tablet Take 210-630 mg by mouth See admin instructions. Take 3 tablets (630 mg) by mouth three times daily with meals, and take 1 tablet (210 mg) twice daily with snacks    [provider]  gabapentin  (NEURONTIN ) 100 MG capsule Take 100 mg by mouth 2 (two) times daily. 10/25/23   [provider]  hydrALAZINE  (APRESOLINE ) 25 MG tablet Take 3 tablets (75 mg total) by mouth every 8 (eight) hours. 02/05/22 11/06/23  Arlice Reichert, MD  hydrOXYzine  (VISTARIL ) 25 MG capsule Take 25 mg by mouth 3 (three) times daily as needed for anxiety. 05/26/23   [provider]  lacosamide  100 MG TABS Take 1 tablet (100 mg total) by mouth 2 (two) times daily. 03/31/24   Christobal Guadalajara, MD  lidocaine -prilocaine  (EMLA ) cream Apply 1 application  topically daily as needed (prior to port access). 07/09/20   [provider]  MELATONIN PO Take 1 tablet by mouth at bedtime.    [provider]  metoCLOPramide  (REGLAN ) 5 MG tablet Take 5 mg by mouth 2 (two) times daily before a meal.    [provider]  metoprolol  tartrate (LOPRESSOR ) 50 MG tablet  Take 1 tablet (50 mg total) by mouth 2 (two) times daily. 03/31/24   Christobal Guadalajara, MD  multivitamin (RENA-VIT) TABS tablet Take 1 tablet by mouth at bedtime. 03/31/24   Christobal Guadalajara, MD  mycophenolate  (CELLCEPT ) 200 mg/mL oral suspension Take 2.5 mLs (500 mg total) by mouth 2 (two) times daily. 03/31/24   Christobal Guadalajara, MD  Nutritional Supplements (FEEDING SUPPLEMENT, NEPRO CARB STEADY,) LIQD Place 1,000 mLs into feeding tube continuous. 03/31/24   Christobal Guadalajara, MD  Nystatin (GERHARDT'S BUTT CREAM) CREA Apply 1 Application topically as needed for irritation. 03/31/24   Christobal Guadalajara, MD  ondansetron  (ZOFRAN ) 4 MG tablet Take 4 mg by  mouth every 8 (eight) hours as needed for nausea or vomiting.    [provider]  oxyCODONE -acetaminophen  (PERCOCET/ROXICET) 5-325 MG tablet Take 1 tablet by mouth 3 (three) times daily.    [provider]  predniSONE  (DELTASONE ) 5 MG tablet Take 15 mg by mouth daily. 06/24/15   [provider]  sevelamer  carbonate (RENVELA ) 2.4 g PACK Take 2.4 g by mouth 3 (three) times daily with meals. 03/31/24   Christobal Guadalajara, MD  simethicone  (MYLICON) 40 MG/0.6ML drops Take 0.6 mLs (40 mg total) by mouth 4 (four) times daily as needed for flatulence. 03/31/24   Christobal Guadalajara, MD  XPHOZAH 30 MG TABS Take 1 tablet by mouth 2 (two) times daily. 06/21/23   [provider]  zolpidem  (AMBIEN ) 10 MG tablet Take 5 mg by mouth at bedtime as needed for sleep.    [provider]    Allergies: Patient has no known allergies.    Review of Systems  Constitutional:  Negative for fever.    Updated Vital Signs BP (!) 136/59 (BP Location: Left Arm)   Pulse 76   Temp 98.1 F (36.7 C) (Oral)   Resp 18   Wt 57.2 kg   LMP 02/13/2024 (Exact Date)   SpO2 95%   BMI 19.46 kg/m   Physical Exam Cardiovascular:     Rate and Rhythm: Normal rate and regular rhythm.  Pulmonary:     Effort: Pulmonary effort is normal.  Abdominal:     General: Abdomen is flat. Bowel sounds are normal.     Palpations: Abdomen is soft.     Comments: GJ tube in place without surrounding erythema, induration, or drainage.   Skin:    General: Skin is warm and dry.    (all labs ordered are listed, but only abnormal results are displayed) Labs Reviewed - No data to display  EKG: None  Radiology: No results found.  Medications Ordered in the ED - No data to display   Medical Decision Making Amount and/or Complexity of Data Reviewed Labs: ordered.   [name] [name] is a 43 y.o. female presenting with clogged GJ tube.    Declogger instrument was used to unclog GJ tube. Nursing was able to  successfully flush patient's GJ tube. Patient stable for discharge and return to her living facility.    Final diagnoses:  Gastrojejunal (GJ) tube in place [facility])    ED Discharge Orders     None          Diona Perkins, MD 04/22/24 1523    Patt Alm Macho, MD 04/22/24 [phone]

## 2024-04-22 NOTE — ED Triage Notes (Signed)
 [facility] due to G tube clogged, from [facility]. [id number]% RA

## 2024-04-27 ENCOUNTER — Encounter (HOSPITAL_COMMUNITY): Payer: Self-pay | Admitting: Emergency Medicine

## 2024-04-27 ENCOUNTER — Other Ambulatory Visit: Payer: Self-pay

## 2024-04-27 ENCOUNTER — Emergency Department (HOSPITAL_COMMUNITY)
Admission: EM | Admit: 2024-04-27 | Discharge: 2024-04-27 | Disposition: A | Discharge status: Home / Self Care | Source: Skilled Nursing Facility | Attending: Emergency Medicine | Admitting: Emergency Medicine

## 2024-04-27 DIAGNOSIS — Z7982 Long term (current) use of aspirin: Secondary | ICD-10-CM | POA: Insufficient documentation

## 2024-04-27 DIAGNOSIS — Y732 Prosthetic and other implants, materials and accessory gastroenterology and urology devices associated with adverse incidents: Secondary | ICD-10-CM | POA: Insufficient documentation

## 2024-04-27 DIAGNOSIS — T85598A Other mechanical complication of other gastrointestinal prosthetic devices, implants and grafts, initial encounter: Secondary | ICD-10-CM | POA: Insufficient documentation

## 2024-04-27 LAB — CBG MONITORING, ED: Glucose-Capillary: 112 mg/dL — ABNORMAL HIGH (ref 70–99)

## 2024-04-27 MED ORDER — PANCRELIPASE (LIP-PROT-AMYL) 10440-39150 UNITS PO TABS
20880.0000 [IU] | ORAL_TABLET | Freq: Once | ORAL | Status: AC
  Administered 2024-04-27: 20880 [IU]
  Filled 2024-04-27: qty 2

## 2024-04-27 MED ORDER — SODIUM BICARBONATE 650 MG PO TABS
650.0000 mg | ORAL_TABLET | Freq: Once | ORAL | Status: AC
  Administered 2024-04-27: 650 mg
  Filled 2024-04-27: qty 1

## 2024-04-27 MED ORDER — MORPHINE SULFATE 15 MG PO TABS: 15.0000 mg | ORAL_TABLET | Freq: Once | ORAL | Status: DC

## 2024-04-27 NOTE — ED Provider Notes (Signed)
 [place] EMERGENCY DEPARTMENT AT [facility] Provider Note   CSN: [id number] Arrival date & time: 04/27/24  9055     Patient presents with: No chief complaint on file.   [name] [name] is a 43 y.o. female.   HPI     43 year old female comes in with chief complaint of clogged J-tube.  Patient currently resides at [facility]  Patient's mother is at the bedside, provides collateral history.  According the mother, patient has been at the facility for about 2 weeks.  This is her second visit to the ER for clogged tube.  Patient had reported some abdominal pain yesterday.  However, she had vomited and thereafter the pain has come down.   Prior to Admission medications   Medication Sig Start Date End Date Taking? Authorizing Provider  albuterol  (PROVENTIL ) (2.5 MG/3ML) 0.083% nebulizer solution Take 3 mLs (2.5 mg total) by nebulization every 4 (four) hours as needed for wheezing or shortness of breath. 03/31/24   Christobal Guadalajara, MD  amLODipine  (NORVASC ) 10 MG tablet Take 1 tablet (10 mg total) by mouth daily. 02/05/22 11/06/23  Arlice Reichert, MD  artificial tears ophthalmic solution Place 1 drop into the left eye as needed for dry eyes. 03/31/24   Christobal Guadalajara, MD  aspirin  EC 81 MG tablet Chew 81 mg by mouth daily.    [provider]  busPIRone  (BUSPAR ) 10 MG tablet Take 10 mg by mouth 2 (two) times daily. 10/25/23   [provider]  cinacalcet  (SENSIPAR ) 30 MG tablet Take 3 tablets (90 mg total) by mouth daily with supper. 06/14/23   Amin, Sumayya, MD  cloNIDine  (CATAPRES  - DOSED IN MG/24 HR) 0.3 mg/24hr patch Place 1 patch (0.3 mg total) onto the skin once a week. 04/04/24   Christobal Guadalajara, MD  cycloSPORINE  (SANDIMMUNE ) 25 MG capsule Take 25 mg by mouth in the morning.    [provider]  cycloSPORINE  modified (NEORAL ) 100 MG capsule Take 2 capsules (200 mg total) by mouth 2 (two) times daily. 12/25/21   Pokhrel, Laxman, MD  Darbepoetin Alfa  (ARANESP ) 60 MCG/0.3ML  SOSY injection Inject 0.3 mLs (60 mcg total) into the vein every Friday with hemodialysis. 11/18/21   Danton Reyes DASEN, MD  doxercalciferol  (HECTOROL ) 4 MCG/2ML injection Inject 3 mLs (6 mcg total) into the vein every Monday, Wednesday, and Friday with hemodialysis. 11/17/21   Danton Reyes DASEN, MD  DULoxetine  (CYMBALTA ) 20 MG capsule Take 1 capsule (20 mg total) by mouth daily. 02/06/22 11/06/23  Arlice Reichert, MD  ferric citrate  (AURYXIA ) 1 GM 210 MG(Fe) tablet Take 210-630 mg by mouth See admin instructions. Take 3 tablets (630 mg) by mouth three times daily with meals, and take 1 tablet (210 mg) twice daily with snacks    [provider]  gabapentin  (NEURONTIN ) 100 MG capsule Take 100 mg by mouth 2 (two) times daily. 10/25/23   [provider]  hydrALAZINE  (APRESOLINE ) 25 MG tablet Take 3 tablets (75 mg total) by mouth every 8 (eight) hours. 02/05/22 11/06/23  Arlice Reichert, MD  hydrOXYzine  (VISTARIL ) 25 MG capsule Take 25 mg by mouth 3 (three) times daily as needed for anxiety. 05/26/23   [provider]  lacosamide  100 MG TABS Take 1 tablet (100 mg total) by mouth 2 (two) times daily. 03/31/24   Christobal Guadalajara, MD  lidocaine -prilocaine  (EMLA ) cream Apply 1 application  topically daily as needed (prior to port access). 07/09/20   [provider]  MELATONIN PO Take 1 tablet by mouth  at bedtime.    [provider]  metoCLOPramide  (REGLAN ) 5 MG tablet Take 5 mg by mouth 2 (two) times daily before a meal.    [provider]  metoprolol  tartrate (LOPRESSOR ) 50 MG tablet Take 1 tablet (50 mg total) by mouth 2 (two) times daily. 03/31/24   Christobal Guadalajara, MD  multivitamin (RENA-VIT) TABS tablet Take 1 tablet by mouth at bedtime. 03/31/24   Christobal Guadalajara, MD  mycophenolate  (CELLCEPT ) 200 mg/mL oral suspension Take 2.5 mLs (500 mg total) by mouth 2 (two) times daily. 03/31/24   Christobal Guadalajara, MD  Nutritional Supplements (FEEDING SUPPLEMENT, NEPRO CARB STEADY,) LIQD Place 1,000 mLs  into feeding tube continuous. 03/31/24   Christobal Guadalajara, MD  Nystatin (GERHARDT'S BUTT CREAM) CREA Apply 1 Application topically as needed for irritation. 03/31/24   Christobal Guadalajara, MD  ondansetron  (ZOFRAN ) 4 MG tablet Take 4 mg by mouth every 8 (eight) hours as needed for nausea or vomiting.    [provider]  oxyCODONE -acetaminophen  (PERCOCET/ROXICET) 5-325 MG tablet Take 1 tablet by mouth 3 (three) times daily.    [provider]  predniSONE  (DELTASONE ) 5 MG tablet Take 15 mg by mouth daily. 06/24/15   [provider]  sevelamer  carbonate (RENVELA ) 2.4 g PACK Take 2.4 g by mouth 3 (three) times daily with meals. 03/31/24   Christobal Guadalajara, MD  simethicone  (MYLICON) 40 MG/0.6ML drops Take 0.6 mLs (40 mg total) by mouth 4 (four) times daily as needed for flatulence. 03/31/24   Christobal Guadalajara, MD  XPHOZAH 30 MG TABS Take 1 tablet by mouth 2 (two) times daily. 06/21/23   [provider]  zolpidem  (AMBIEN ) 10 MG tablet Take 5 mg by mouth at bedtime as needed for sleep.    [provider]    Allergies: Patient has no known allergies.    Review of Systems  All other systems reviewed and are negative.   Updated Vital Signs BP (!) 166/68   Pulse 76   Temp 97.8 F (36.6 C) (Oral)   Resp 20   LMP 02/13/2024 (Exact Date)   SpO2 97%   Physical Exam Vitals and nursing note reviewed.  Constitutional:      Appearance: She is well-developed.  HENT:     Head: Atraumatic.  Cardiovascular:     Rate and Rhythm: Normal rate.  Pulmonary:     Effort: Pulmonary effort is normal.  Abdominal:     Palpations: Abdomen is soft.     Comments: Patient has a GJ tube in place.  There is some crusting at the insertion site, but no erythema, no fluctuant mass and there is no focal tenderness of the abdomen.  Patient has mild generalized tenderness without any rebound or guarding  Musculoskeletal:     Cervical back: Normal range of motion and neck supple.  Skin:    General: Skin is  warm and dry.  Neurological:     Mental Status: She is alert and oriented to person, place, and time.     (all labs ordered are listed, but only abnormal results are displayed) Labs Reviewed - No data to display  EKG: None  Radiology: No results found.   Aspiration of blood/fluid  Date/Time: 04/27/2024 1:19 PM  Performed by: Charlyn Sora, MD Authorized by: Charlyn Sora, MD  Consent: Verbal consent obtained Risks and benefits: risks, benefits and alternatives were discussed Consent given by: patient Patient understanding: patient states understanding of the procedure being performed Patient identity confirmed: arm band Time out: Immediately  prior to procedure a time out was called to verify the correct patient, procedure, equipment, support staff and site/side marked as required. Preparation: Patient was prepped and draped in the usual sterile fashion. Local anesthesia used: no  Anesthesia: Local anesthesia used: no  Sedation: Patient sedated: no  Comments: Patient's GJ tube was declogged.  The G-tube declog by using warm saline. J-tube, we tried mechanical instrument to declot, but that was unsuccessful.  Ultimately, pancreatic meds were ordered, and we were able to declog the J-tube.      Medications Ordered in the ED  morphine  (MSIR) tablet 15 mg (0 mg Per Tube Hold 04/27/24 1158)  lipase/protease/amylase) (VIOKACE ) tablets 20,880 Units (20,880 Units Per Tube Given by Other 04/27/24 1252)    And  sodium bicarbonate  tablet 650 mg (650 mg Per Tube Given 04/27/24 1252)                                    Medical Decision Making Risk OTC drugs. Prescription drug management.   43 year old patient comes in with chief complaint of GJ tube clogging. Second visit to the ER in the last 2 weeks for the same complaint.  GJ tube was declogged with pancreatic enzymes with bicarbonate. Patient's abdominal exam is reassuring.  She received her oral narcotic  medicine for chronic pain.  No indication for advanced imaging or labs at this time as no concerns for peritonitis.  Vital signs are stable.  Mother made aware of the plan and is comfortable with patient returning.  She is at the bedside.  Collateral history provided by patient's mother.  I also reviewed patient's records including IR notes. Final diagnoses:  Clogged feeding tube    ED Discharge Orders     None          Charlyn Sora, MD 04/27/24 1321

## 2024-04-27 NOTE — ED Triage Notes (Signed)
 Pt BIBA from [facility] for clogged g tube. Per facility, tube was not flushed last night. Pt had pain meds at 2am this am but threw them up. 4mg  zofran  odt given at facility  189/76 Hr 84 Cbg 117

## 2024-04-27 NOTE — ED Notes (Signed)
 Following bicarb and enzyme application, J Tube flushes well now with no resistance.

## 2024-04-27 NOTE — Discharge Instructions (Signed)
 Please make sure that the J-tube and the G-tube ports are properly taken care of.  There should be adequate irrigation to prevent debris from clogging the tube.

## 2024-04-27 NOTE — ED Notes (Signed)
 PTAR called

## 2024-04-29 ENCOUNTER — Telehealth: Payer: Self-pay | Admitting: Neurology

## 2024-04-29 NOTE — Telephone Encounter (Signed)
 [facility]) called to check on referral for patient and if scheduled.

## 2024-05-02 ENCOUNTER — Encounter: Payer: Self-pay | Admitting: Gastroenterology

## 2024-05-19 ENCOUNTER — Emergency Department (HOSPITAL_COMMUNITY)

## 2024-05-19 ENCOUNTER — Inpatient Hospital Stay (HOSPITAL_COMMUNITY)
Admission: EM | Admit: 2024-05-19 | Discharge: 2024-08-27 | DRG: 023 | Disposition: A | Discharge status: Home Health | Attending: Family Medicine | Admitting: Family Medicine

## 2024-05-19 ENCOUNTER — Inpatient Hospital Stay (HOSPITAL_COMMUNITY)

## 2024-05-19 DIAGNOSIS — R29810 Facial weakness: Secondary | ICD-10-CM | POA: Diagnosis present

## 2024-05-19 DIAGNOSIS — E1043 Type 1 diabetes mellitus with diabetic autonomic (poly)neuropathy: Secondary | ICD-10-CM | POA: Diagnosis present

## 2024-05-19 DIAGNOSIS — G40909 Epilepsy, unspecified, not intractable, without status epilepticus: Secondary | ICD-10-CM | POA: Diagnosis present

## 2024-05-19 DIAGNOSIS — D649 Anemia, unspecified: Secondary | ICD-10-CM | POA: Diagnosis not present

## 2024-05-19 DIAGNOSIS — I615 Nontraumatic intracerebral hemorrhage, intraventricular: Secondary | ICD-10-CM | POA: Diagnosis present

## 2024-05-19 DIAGNOSIS — R1310 Dysphagia, unspecified: Secondary | ICD-10-CM | POA: Diagnosis not present

## 2024-05-19 DIAGNOSIS — E1022 Type 1 diabetes mellitus with diabetic chronic kidney disease: Secondary | ICD-10-CM | POA: Diagnosis present

## 2024-05-19 DIAGNOSIS — G40901 Epilepsy, unspecified, not intractable, with status epilepticus: Secondary | ICD-10-CM | POA: Diagnosis not present

## 2024-05-19 DIAGNOSIS — Y848 Other medical procedures as the cause of abnormal reaction of the patient, or of later complication, without mention of misadventure at the time of the procedure: Secondary | ICD-10-CM | POA: Diagnosis present

## 2024-05-19 DIAGNOSIS — R54 Age-related physical debility: Secondary | ICD-10-CM | POA: Diagnosis present

## 2024-05-19 DIAGNOSIS — G919 Hydrocephalus, unspecified: Secondary | ICD-10-CM

## 2024-05-19 DIAGNOSIS — R162 Hepatomegaly with splenomegaly, not elsewhere classified: Secondary | ICD-10-CM | POA: Diagnosis present

## 2024-05-19 DIAGNOSIS — R140 Abdominal distension (gaseous): Secondary | ICD-10-CM | POA: Diagnosis present

## 2024-05-19 DIAGNOSIS — E7220 Disorder of urea cycle metabolism, unspecified: Secondary | ICD-10-CM | POA: Diagnosis not present

## 2024-05-19 DIAGNOSIS — Z992 Dependence on renal dialysis: Secondary | ICD-10-CM | POA: Diagnosis not present

## 2024-05-19 DIAGNOSIS — D696 Thrombocytopenia, unspecified: Secondary | ICD-10-CM | POA: Diagnosis present

## 2024-05-19 DIAGNOSIS — E1069 Type 1 diabetes mellitus with other specified complication: Secondary | ICD-10-CM | POA: Diagnosis not present

## 2024-05-19 DIAGNOSIS — J189 Pneumonia, unspecified organism: Secondary | ICD-10-CM | POA: Diagnosis present

## 2024-05-19 DIAGNOSIS — I610 Nontraumatic intracerebral hemorrhage in hemisphere, subcortical: Secondary | ICD-10-CM | POA: Diagnosis not present

## 2024-05-19 DIAGNOSIS — J150 Pneumonia due to Klebsiella pneumoniae: Secondary | ICD-10-CM | POA: Diagnosis not present

## 2024-05-19 DIAGNOSIS — K219 Gastro-esophageal reflux disease without esophagitis: Secondary | ICD-10-CM | POA: Diagnosis present

## 2024-05-19 DIAGNOSIS — E878 Other disorders of electrolyte and fluid balance, not elsewhere classified: Secondary | ICD-10-CM | POA: Diagnosis present

## 2024-05-19 DIAGNOSIS — E871 Hypo-osmolality and hyponatremia: Secondary | ICD-10-CM | POA: Diagnosis present

## 2024-05-19 DIAGNOSIS — G9340 Encephalopathy, unspecified: Secondary | ICD-10-CM | POA: Diagnosis present

## 2024-05-19 DIAGNOSIS — I953 Hypotension of hemodialysis: Secondary | ICD-10-CM | POA: Diagnosis not present

## 2024-05-19 DIAGNOSIS — L299 Pruritus, unspecified: Secondary | ICD-10-CM | POA: Diagnosis not present

## 2024-05-19 DIAGNOSIS — Z79624 Long term (current) use of inhibitors of nucleotide synthesis: Secondary | ICD-10-CM

## 2024-05-19 DIAGNOSIS — R509 Fever, unspecified: Secondary | ICD-10-CM | POA: Diagnosis not present

## 2024-05-19 DIAGNOSIS — H547 Unspecified visual loss: Secondary | ICD-10-CM | POA: Diagnosis present

## 2024-05-19 DIAGNOSIS — J1569 Pneumonia due to other gram-negative bacteria: Secondary | ICD-10-CM | POA: Diagnosis not present

## 2024-05-19 DIAGNOSIS — N186 End stage renal disease: Secondary | ICD-10-CM | POA: Diagnosis present

## 2024-05-19 DIAGNOSIS — I1A0 Resistant hypertension: Secondary | ICD-10-CM | POA: Diagnosis not present

## 2024-05-19 DIAGNOSIS — I161 Hypertensive emergency: Secondary | ICD-10-CM | POA: Diagnosis present

## 2024-05-19 DIAGNOSIS — Z9483 Pancreas transplant status: Secondary | ICD-10-CM

## 2024-05-19 DIAGNOSIS — I951 Orthostatic hypotension: Secondary | ICD-10-CM | POA: Diagnosis present

## 2024-05-19 DIAGNOSIS — R471 Dysarthria and anarthria: Secondary | ICD-10-CM | POA: Diagnosis present

## 2024-05-19 DIAGNOSIS — G911 Obstructive hydrocephalus: Secondary | ICD-10-CM | POA: Diagnosis present

## 2024-05-19 DIAGNOSIS — D509 Iron deficiency anemia, unspecified: Secondary | ICD-10-CM | POA: Diagnosis not present

## 2024-05-19 DIAGNOSIS — R29731 NIHSS score 31: Secondary | ICD-10-CM | POA: Diagnosis present

## 2024-05-19 DIAGNOSIS — I611 Nontraumatic intracerebral hemorrhage in hemisphere, cortical: Secondary | ICD-10-CM

## 2024-05-19 DIAGNOSIS — Z79621 Long term (current) use of calcineurin inhibitor: Secondary | ICD-10-CM

## 2024-05-19 DIAGNOSIS — R29724 NIHSS score 24: Secondary | ICD-10-CM | POA: Diagnosis not present

## 2024-05-19 DIAGNOSIS — N2581 Secondary hyperparathyroidism of renal origin: Secondary | ICD-10-CM | POA: Diagnosis present

## 2024-05-19 DIAGNOSIS — F32A Depression, unspecified: Secondary | ICD-10-CM | POA: Diagnosis present

## 2024-05-19 DIAGNOSIS — K8020 Calculus of gallbladder without cholecystitis without obstruction: Secondary | ICD-10-CM | POA: Diagnosis present

## 2024-05-19 DIAGNOSIS — D631 Anemia in chronic kidney disease: Secondary | ICD-10-CM | POA: Diagnosis present

## 2024-05-19 DIAGNOSIS — E8770 Fluid overload, unspecified: Secondary | ICD-10-CM | POA: Diagnosis present

## 2024-05-19 DIAGNOSIS — Z87891 Personal history of nicotine dependence: Secondary | ICD-10-CM | POA: Diagnosis not present

## 2024-05-19 DIAGNOSIS — E875 Hyperkalemia: Secondary | ICD-10-CM | POA: Diagnosis present

## 2024-05-19 DIAGNOSIS — Z515 Encounter for palliative care: Secondary | ICD-10-CM | POA: Diagnosis not present

## 2024-05-19 DIAGNOSIS — J9601 Acute respiratory failure with hypoxia: Secondary | ICD-10-CM | POA: Diagnosis present

## 2024-05-19 DIAGNOSIS — G9389 Other specified disorders of brain: Secondary | ICD-10-CM | POA: Diagnosis present

## 2024-05-19 DIAGNOSIS — Z8249 Family history of ischemic heart disease and other diseases of the circulatory system: Secondary | ICD-10-CM

## 2024-05-19 DIAGNOSIS — Z933 Colostomy status: Secondary | ICD-10-CM

## 2024-05-19 DIAGNOSIS — R29725 NIHSS score 25: Secondary | ICD-10-CM

## 2024-05-19 DIAGNOSIS — Z79899 Other long term (current) drug therapy: Secondary | ICD-10-CM

## 2024-05-19 DIAGNOSIS — J95851 Ventilator associated pneumonia: Secondary | ICD-10-CM | POA: Diagnosis present

## 2024-05-19 DIAGNOSIS — Z681 Body mass index (BMI) 19 or less, adult: Secondary | ICD-10-CM

## 2024-05-19 DIAGNOSIS — I10 Essential (primary) hypertension: Secondary | ICD-10-CM | POA: Diagnosis not present

## 2024-05-19 DIAGNOSIS — T8612 Kidney transplant failure: Secondary | ICD-10-CM | POA: Diagnosis not present

## 2024-05-19 DIAGNOSIS — D72829 Elevated white blood cell count, unspecified: Secondary | ICD-10-CM | POA: Diagnosis not present

## 2024-05-19 DIAGNOSIS — J209 Acute bronchitis, unspecified: Secondary | ICD-10-CM | POA: Diagnosis not present

## 2024-05-19 DIAGNOSIS — F1110 Opioid abuse, uncomplicated: Secondary | ICD-10-CM | POA: Diagnosis present

## 2024-05-19 DIAGNOSIS — R000 Tachycardia, unspecified: Secondary | ICD-10-CM | POA: Diagnosis present

## 2024-05-19 DIAGNOSIS — I619 Nontraumatic intracerebral hemorrhage, unspecified: Secondary | ICD-10-CM | POA: Diagnosis not present

## 2024-05-19 DIAGNOSIS — Z7189 Other specified counseling: Secondary | ICD-10-CM | POA: Diagnosis not present

## 2024-05-19 DIAGNOSIS — E876 Hypokalemia: Secondary | ICD-10-CM | POA: Diagnosis present

## 2024-05-19 DIAGNOSIS — K6289 Other specified diseases of anus and rectum: Secondary | ICD-10-CM | POA: Diagnosis present

## 2024-05-19 DIAGNOSIS — R29721 NIHSS score 21: Secondary | ICD-10-CM | POA: Diagnosis not present

## 2024-05-19 DIAGNOSIS — Z794 Long term (current) use of insulin: Secondary | ICD-10-CM

## 2024-05-19 DIAGNOSIS — Z8673 Personal history of transient ischemic attack (TIA), and cerebral infarction without residual deficits: Secondary | ICD-10-CM

## 2024-05-19 DIAGNOSIS — Z7982 Long term (current) use of aspirin: Secondary | ICD-10-CM

## 2024-05-19 DIAGNOSIS — E440 Moderate protein-calorie malnutrition: Secondary | ICD-10-CM | POA: Diagnosis present

## 2024-05-19 DIAGNOSIS — R197 Diarrhea, unspecified: Secondary | ICD-10-CM | POA: Diagnosis present

## 2024-05-19 DIAGNOSIS — R569 Unspecified convulsions: Secondary | ICD-10-CM | POA: Diagnosis not present

## 2024-05-19 DIAGNOSIS — R4701 Aphasia: Secondary | ICD-10-CM | POA: Diagnosis present

## 2024-05-19 DIAGNOSIS — E1059 Type 1 diabetes mellitus with other circulatory complications: Secondary | ICD-10-CM | POA: Diagnosis not present

## 2024-05-19 DIAGNOSIS — J811 Chronic pulmonary edema: Secondary | ICD-10-CM | POA: Diagnosis present

## 2024-05-19 DIAGNOSIS — L8990 Pressure ulcer of unspecified site, unspecified stage: Secondary | ICD-10-CM | POA: Insufficient documentation

## 2024-05-19 DIAGNOSIS — I120 Hypertensive chronic kidney disease with stage 5 chronic kidney disease or end stage renal disease: Secondary | ICD-10-CM | POA: Diagnosis present

## 2024-05-19 DIAGNOSIS — L89151 Pressure ulcer of sacral region, stage 1: Secondary | ICD-10-CM | POA: Diagnosis present

## 2024-05-19 DIAGNOSIS — E1051 Type 1 diabetes mellitus with diabetic peripheral angiopathy without gangrene: Secondary | ICD-10-CM | POA: Diagnosis not present

## 2024-05-19 DIAGNOSIS — J9600 Acute respiratory failure, unspecified whether with hypoxia or hypercapnia: Secondary | ICD-10-CM | POA: Diagnosis not present

## 2024-05-19 DIAGNOSIS — T85618A Breakdown (mechanical) of other specified internal prosthetic devices, implants and grafts, initial encounter: Secondary | ICD-10-CM | POA: Diagnosis present

## 2024-05-19 DIAGNOSIS — K3184 Gastroparesis: Secondary | ICD-10-CM | POA: Diagnosis present

## 2024-05-19 DIAGNOSIS — Z8701 Personal history of pneumonia (recurrent): Secondary | ICD-10-CM

## 2024-05-19 DIAGNOSIS — E1143 Type 2 diabetes mellitus with diabetic autonomic (poly)neuropathy: Secondary | ICD-10-CM | POA: Diagnosis not present

## 2024-05-19 DIAGNOSIS — D84821 Immunodeficiency due to drugs: Secondary | ICD-10-CM | POA: Diagnosis present

## 2024-05-19 DIAGNOSIS — Z9911 Dependence on respirator [ventilator] status: Secondary | ICD-10-CM | POA: Diagnosis not present

## 2024-05-19 DIAGNOSIS — F419 Anxiety disorder, unspecified: Secondary | ICD-10-CM | POA: Diagnosis present

## 2024-05-19 DIAGNOSIS — G4700 Insomnia, unspecified: Secondary | ICD-10-CM | POA: Diagnosis present

## 2024-05-19 LAB — POCT I-STAT 7, (LYTES, BLD GAS, ICA,H+H)
Acid-Base Excess: 13 mmol/L — ABNORMAL HIGH (ref 0.0–2.0)
Bicarbonate: 35.3 mmol/L — ABNORMAL HIGH (ref 20.0–28.0)
Calcium, Ion: 1.18 mmol/L (ref 1.15–1.40)
HCT: 34 % — ABNORMAL LOW (ref 36.0–46.0)
Hemoglobin: 11.6 g/dL — ABNORMAL LOW (ref 12.0–15.0)
O2 Saturation: 100 %
Patient temperature: 98.3
Potassium: 2.8 mmol/L — ABNORMAL LOW (ref 3.5–5.1)
Sodium: 137 mmol/L (ref 135–145)
TCO2: 36 mmol/L — ABNORMAL HIGH (ref 22–32)
pCO2 arterial: 35.1 mmHg (ref 32–48)
pH, Arterial: 7.61 (ref 7.35–7.45)
pO2, Arterial: 233 mmHg — ABNORMAL HIGH (ref 83–108)

## 2024-05-19 LAB — DIFFERENTIAL
Abs Immature Granulocytes: 0.02 K/uL (ref 0.00–0.07)
Basophils Absolute: 0 K/uL (ref 0.0–0.1)
Basophils Relative: 0 %
Eosinophils Absolute: 0 K/uL (ref 0.0–0.5)
Eosinophils Relative: 0 %
Immature Granulocytes: 0 %
Lymphocytes Relative: 8 %
Lymphs Abs: 0.4 K/uL — ABNORMAL LOW (ref 0.7–4.0)
Monocytes Absolute: 0.2 K/uL (ref 0.1–1.0)
Monocytes Relative: 4 %
Neutro Abs: 4.2 K/uL (ref 1.7–7.7)
Neutrophils Relative %: 88 %

## 2024-05-19 LAB — COMPREHENSIVE METABOLIC PANEL WITH GFR
ALT: 19 U/L (ref 0–44)
AST: 23 U/L (ref 15–41)
Albumin: 3.6 g/dL (ref 3.5–5.0)
Alkaline Phosphatase: 88 U/L (ref 38–126)
Anion gap: 19 — ABNORMAL HIGH (ref 5–15)
BUN: 10 mg/dL (ref 6–20)
CO2: 24 mmol/L (ref 22–32)
Calcium: 9.3 mg/dL (ref 8.9–10.3)
Chloride: 95 mmol/L — ABNORMAL LOW (ref 98–111)
Creatinine, Ser: 2.8 mg/dL — ABNORMAL HIGH (ref 0.44–1.00)
GFR, Estimated: 21 mL/min — ABNORMAL LOW (ref >60)
Glucose, Bld: 147 mg/dL — ABNORMAL HIGH (ref 70–99)
Potassium: 3.1 mmol/L — ABNORMAL LOW (ref 3.5–5.1)
Sodium: 138 mmol/L (ref 135–145)
Total Bilirubin: 0.9 mg/dL (ref 0.0–1.2)
Total Protein: 7.3 g/dL (ref 6.5–8.1)

## 2024-05-19 LAB — ETHANOL: Alcohol, Ethyl (B): <15 mg/dL (ref <15)

## 2024-05-19 LAB — I-STAT CHEM 8, ED
BUN: 9 mg/dL (ref 6–20)
Calcium, Ion: 1.04 mmol/L — ABNORMAL LOW (ref 1.15–1.40)
Chloride: 97 mmol/L — ABNORMAL LOW (ref 98–111)
Creatinine, Ser: 2.9 mg/dL — ABNORMAL HIGH (ref 0.44–1.00)
Glucose, Bld: 154 mg/dL — ABNORMAL HIGH (ref 70–99)
HCT: 38 % (ref 36.0–46.0)
Hemoglobin: 12.9 g/dL (ref 12.0–15.0)
Potassium: 3 mmol/L — ABNORMAL LOW (ref 3.5–5.1)
Sodium: 139 mmol/L (ref 135–145)
TCO2: 29 mmol/L (ref 22–32)

## 2024-05-19 LAB — CBC
HCT: 38.5 % (ref 36.0–46.0)
Hemoglobin: 12.2 g/dL (ref 12.0–15.0)
MCH: 31.1 pg (ref 26.0–34.0)
MCHC: 31.7 g/dL (ref 30.0–36.0)
MCV: 98.2 fL (ref 80.0–100.0)
Platelets: 114 K/uL — ABNORMAL LOW (ref 150–400)
RBC: 3.92 MIL/uL (ref 3.87–5.11)
RDW: 14.2 % (ref 11.5–15.5)
WBC: 4.8 K/uL (ref 4.0–10.5)
nRBC: 0 % (ref 0.0–0.2)

## 2024-05-19 LAB — APTT: aPTT: 30 s (ref 24–36)

## 2024-05-19 LAB — GLUCOSE, CAPILLARY
Glucose-Capillary: 154 mg/dL — ABNORMAL HIGH (ref 70–99)
Glucose-Capillary: 144 mg/dL — ABNORMAL HIGH (ref 70–99)
Glucose-Capillary: 142 mg/dL — ABNORMAL HIGH (ref 70–99)

## 2024-05-19 LAB — MRSA NEXT GEN BY PCR, NASAL: MRSA by PCR Next Gen: NOT DETECTED

## 2024-05-19 LAB — PROTIME-INR
INR: 1.1 (ref 0.8–1.2)
Prothrombin Time: 14.9 s (ref 11.4–15.2)

## 2024-05-19 MED ORDER — ROCURONIUM BROMIDE 10 MG/ML (PF) SYRINGE
PREFILLED_SYRINGE | INTRAVENOUS | Status: AC
  Administered 2024-05-19: 100 mg
  Filled 2024-05-19: qty 10

## 2024-05-19 MED ORDER — STROKE: EARLY STAGES OF RECOVERY BOOK: Freq: Once | Status: AC

## 2024-05-19 MED ORDER — ACETAMINOPHEN 650 MG RE SUPP: 650.0000 mg | RECTAL | Status: DC | PRN

## 2024-05-19 MED ORDER — PANTOPRAZOLE SODIUM 40 MG IV SOLR
40.0000 mg | Freq: Every day | INTRAVENOUS | Status: DC
Start: 2024-05-19 — End: 2024-05-19

## 2024-05-19 MED ORDER — INSULIN ASPART 100 UNIT/ML IJ SOLN
0.0000 [IU] | INTRAMUSCULAR | Status: DC
  Administered 2024-05-19 – 2024-05-22 (×11): 1 [IU] via SUBCUTANEOUS
  Administered 2024-05-22 (×3): 2 [IU] via SUBCUTANEOUS
  Administered 2024-05-22 – 2024-05-23 (×2): 1 [IU] via SUBCUTANEOUS
  Administered 2024-05-23 (×3): 2 [IU] via SUBCUTANEOUS
  Administered 2024-05-23: 1 [IU] via SUBCUTANEOUS
  Administered 2024-05-24 (×2): 2 [IU] via SUBCUTANEOUS
  Administered 2024-05-24 – 2024-05-25 (×7): 1 [IU] via SUBCUTANEOUS
  Administered 2024-05-25: 2 [IU] via SUBCUTANEOUS
  Administered 2024-05-25 – 2024-05-26 (×3): 1 [IU] via SUBCUTANEOUS

## 2024-05-19 MED ORDER — ORAL CARE MOUTH RINSE: 15.0000 mL | OROMUCOSAL | Status: DC | PRN

## 2024-05-19 MED ORDER — SODIUM CHLORIDE 0.9% FLUSH: 3.0000 mL | Freq: Once | INTRAVENOUS | Status: DC

## 2024-05-19 MED ORDER — SENNOSIDES-DOCUSATE SODIUM 8.6-50 MG PO TABS: 1.0000 | ORAL_TABLET | Freq: Two times a day (BID) | ORAL | Status: DC

## 2024-05-19 MED ORDER — CLEVIDIPINE BUTYRATE 0.5 MG/ML IV EMUL
0.0000 mg/h | INTRAVENOUS | Status: DC
  Administered 2024-05-19: 2 mg/h via INTRAVENOUS
  Administered 2024-05-19: 10 mg/h via INTRAVENOUS
  Administered 2024-05-20: 8 mg/h via INTRAVENOUS
  Administered 2024-05-20: 18 mg/h via INTRAVENOUS
  Administered 2024-05-20: 16 mg/h via INTRAVENOUS
  Administered 2024-05-20: 21 mg/h via INTRAVENOUS
  Administered 2024-05-20: 13 mg/h via INTRAVENOUS
  Administered 2024-05-20: 18 mg/h via INTRAVENOUS
  Administered 2024-05-20: 14 mg/h via INTRAVENOUS
  Administered 2024-05-20: 12 mg/h via INTRAVENOUS
  Administered 2024-05-21: 8 mg/h via INTRAVENOUS
  Administered 2024-05-21: 30 mg/h via INTRAVENOUS
  Administered 2024-05-21 (×2): 27 mg/h via INTRAVENOUS
  Administered 2024-05-21: 20 mg/h via INTRAVENOUS
  Administered 2024-05-21: 23 mg/h via INTRAVENOUS
  Administered 2024-05-22: 16 mg/h via INTRAVENOUS
  Administered 2024-05-22: 21 mg/h via INTRAVENOUS
  Administered 2024-05-22: 26 mg/h via INTRAVENOUS
  Administered 2024-05-22: 10 mg/h via INTRAVENOUS
  Administered 2024-05-22 (×2): 28 mg/h via INTRAVENOUS
  Administered 2024-05-22 – 2024-05-23 (×3): 15 mg/h via INTRAVENOUS
  Administered 2024-05-23: 7 mg/h via INTRAVENOUS
  Administered 2024-05-23: 10 mg/h via INTRAVENOUS
  Administered 2024-05-23: 20 mg/h via INTRAVENOUS
  Administered 2024-05-23: 17 mg/h via INTRAVENOUS
  Administered 2024-05-23: 30 mg/h via INTRAVENOUS
  Administered 2024-05-23: 23 mg/h via INTRAVENOUS
  Administered 2024-05-24: 14 mg/h via INTRAVENOUS
  Administered 2024-05-24: 15 mg/h via INTRAVENOUS
  Administered 2024-05-24: 19 mg/h via INTRAVENOUS
  Administered 2024-05-24: 21 mg/h via INTRAVENOUS
  Administered 2024-05-24: 16 mg/h via INTRAVENOUS
  Administered 2024-05-24: 14 mg/h via INTRAVENOUS
  Administered 2024-05-24 – 2024-05-25 (×2): 16 mg/h via INTRAVENOUS
  Administered 2024-05-25: 14 mg/h via INTRAVENOUS
  Administered 2024-05-25: 12 mg/h via INTRAVENOUS
  Filled 2024-05-19: qty 50
  Filled 2024-05-19 (×4): qty 100
  Filled 2024-05-19: qty 50
  Filled 2024-05-19: qty 100
  Filled 2024-05-19 (×2): qty 50
  Filled 2024-05-19 (×4): qty 100
  Filled 2024-05-19: qty 200
  Filled 2024-05-19 (×14): qty 100
  Filled 2024-05-19 (×2): qty 200
  Filled 2024-05-19 (×9): qty 100
  Filled 2024-05-19: qty 50
  Filled 2024-05-19 (×3): qty 100

## 2024-05-19 MED ORDER — ACETAMINOPHEN 160 MG/5ML PO SOLN: 650.0000 mg | ORAL | Status: DC | PRN

## 2024-05-19 MED ORDER — ETOMIDATE 2 MG/ML IV SOLN
INTRAVENOUS | Status: AC
  Administered 2024-05-19: 40 mg
  Filled 2024-05-19: qty 20

## 2024-05-19 MED ORDER — ORAL CARE MOUTH RINSE
15.0000 mL | OROMUCOSAL | Status: DC
  Administered 2024-05-19 – 2024-05-27 (×94): 15 mL via OROMUCOSAL

## 2024-05-19 MED ORDER — HYDRALAZINE HCL 20 MG/ML IJ SOLN
INTRAMUSCULAR | Status: AC
  Filled 2024-05-19: qty 1

## 2024-05-19 MED ORDER — CHLORHEXIDINE GLUCONATE CLOTH 2 % EX PADS: 6.0000 | MEDICATED_PAD | Freq: Every day | CUTANEOUS | Status: DC

## 2024-05-19 MED ORDER — LABETALOL HCL 5 MG/ML IV SOLN: 20.0000 mg | Freq: Once | INTRAVENOUS | Status: DC

## 2024-05-19 MED ORDER — DEXMEDETOMIDINE HCL IN NACL 400 MCG/100ML IV SOLN
INTRAVENOUS | Status: AC
  Administered 2024-05-19: 0.4 ug/kg/h via INTRAVENOUS
  Filled 2024-05-19: qty 100

## 2024-05-19 MED ORDER — LEVETIRACETAM (KEPPRA) 500 MG/5 ML ADULT IV PUSH
2000.0000 mg | Freq: Once | INTRAVENOUS | Status: AC
Start: 2024-05-19 — End: 2024-05-19
  Administered 2024-05-19: 2000 mg via INTRAVENOUS
  Filled 2024-05-19: qty 20

## 2024-05-19 MED ORDER — CHLORHEXIDINE GLUCONATE CLOTH 2 % EX PADS
6.0000 | MEDICATED_PAD | CUTANEOUS | Status: DC
  Administered 2024-05-19 – 2024-05-22 (×3): 6 via TOPICAL

## 2024-05-19 MED ORDER — DEXMEDETOMIDINE HCL IN NACL 400 MCG/100ML IV SOLN
0.0000 ug/kg/h | INTRAVENOUS | Status: DC
  Administered 2024-05-20: 0.9 ug/kg/h via INTRAVENOUS
  Administered 2024-05-20: 0.3 ug/kg/h via INTRAVENOUS
  Administered 2024-05-21: 0.4 ug/kg/h via INTRAVENOUS
  Administered 2024-05-22: 0.9 ug/kg/h via INTRAVENOUS
  Filled 2024-05-19 (×6): qty 100

## 2024-05-19 MED ORDER — LORAZEPAM 2 MG/ML IJ SOLN
1.0000 mg | Freq: Once | INTRAMUSCULAR | Status: AC
  Administered 2024-05-19: 1 mg via INTRAMUSCULAR

## 2024-05-19 MED ORDER — POLYETHYLENE GLYCOL 3350 17 G PO PACK: 17.0000 g | PACK | Freq: Every day | ORAL | Status: DC | PRN

## 2024-05-19 MED ORDER — NOREPINEPHRINE 4 MG/250ML-% IV SOLN
INTRAVENOUS | Status: AC
  Filled 2024-05-19: qty 250

## 2024-05-19 MED ORDER — HYDRALAZINE HCL 20 MG/ML IJ SOLN
20.0000 mg | Freq: Once | INTRAMUSCULAR | Status: AC
  Administered 2024-05-19: 20 mg via INTRAMUSCULAR

## 2024-05-19 MED ORDER — DOCUSATE SODIUM 100 MG PO CAPS: 100.0000 mg | ORAL_CAPSULE | Freq: Two times a day (BID) | ORAL | Status: DC | PRN

## 2024-05-19 MED ORDER — LORAZEPAM 2 MG/ML IJ SOLN
INTRAMUSCULAR | Status: AC
  Filled 2024-05-19: qty 1

## 2024-05-19 MED ORDER — STROKE: EARLY STAGES OF RECOVERY BOOK
Freq: Once | Status: DC
  Filled 2024-05-19: qty 1

## 2024-05-19 MED ORDER — PANTOPRAZOLE SODIUM 40 MG IV SOLR
40.0000 mg | Freq: Every day | INTRAVENOUS | Status: DC
  Administered 2024-05-19 – 2024-07-07 (×50): 40 mg via INTRAVENOUS
  Filled 2024-05-19 (×50): qty 10

## 2024-05-19 MED ORDER — SODIUM CHLORIDE 0.9 % IV SOLN: INTRAVENOUS | Status: AC | PRN

## 2024-05-19 MED ORDER — ACETAMINOPHEN 325 MG PO TABS
650.0000 mg | ORAL_TABLET | ORAL | Status: DC | PRN
  Filled 2024-05-19: qty 2

## 2024-05-19 MED ORDER — LIDOCAINE-EPINEPHRINE (PF) 2 %-1:200000 IJ SOLN
INTRAMUSCULAR | Status: AC
  Administered 2024-05-19: 20 mL
  Filled 2024-05-19: qty 20

## 2024-05-19 NOTE — Procedures (Signed)
 Arterial Line Insertion Start/End9/15/2025 8:45 PM, 05/19/2024 8:50 PM  Patient location: ICU. Preanesthetic checklist: patient identified, IV checked, site marked, risks and benefits discussed, monitors and equipment checked, pre-op evaluation and timeout performed Left, radial was placed Catheter size: 20 G Hand hygiene performed , maximum sterile barriers used  and Seldinger technique used Allen's test indicative of satisfactory collateral circulation Attempts: 1 Procedure performed without using ultrasound guided technique. Following insertion, dressing applied and Biopatch. Post procedure assessment: normal  Patient tolerated the procedure well with no immediate complications.

## 2024-05-19 NOTE — ED Provider Notes (Signed)
 [facility]TH NEURO/TRAUMA/SURGICAL ICU Provider Note   CSN: [id number] Arrival date & time: 05/19/24  1607  An emergency department physician performed an initial assessment on this suspected stroke patient at 85.  Patient presents with: Code Stroke   Deondrea Aguado is a 43 y.o. female.   HPI Patient came in as a code stroke.  Was at dialysis and reportedly had flaccid on the left side decreased mental status.  Has looking to the left.  Now moving the left side more.  Has had previous ischemic strokes and bleeds.  Unknown amount of dialysis done.  Met by Dr. Deedra and myself at the bridge.   Past Medical History:  Diagnosis Date   Anemia    Anxiety    DM (diabetes mellitus), type 1 (HCC)    ESRD on hemodialysis (HCC)    Gastroparesis    History of simultaneous kidney and pancreas transplant (HCC) 2014   Hypertension    Ischemic cerebrovascular accident (CVA) (HCC) 11/2021   Narcotic abuse (HCC)    Non compliance with medical treatment    Stroke [facility])    Vision loss     Prior to Admission medications   Medication Sig Start Date End Date Taking? Authorizing Provider  albuterol  (PROVENTIL ) (2.5 MG/3ML) 0.083% nebulizer solution Take 3 mLs (2.5 mg total) by nebulization every 4 (four) hours as needed for wheezing or shortness of breath. 03/31/24   Christobal Guadalajara, MD  amLODipine  (NORVASC ) 10 MG tablet Take 1 tablet (10 mg total) by mouth daily. 02/05/22 11/06/23  Arlice Reichert, MD  artificial tears ophthalmic solution Place 1 drop into the left eye as needed for dry eyes. 03/31/24   Christobal Guadalajara, MD  aspirin  EC 81 MG tablet Chew 81 mg by mouth daily.    [provider]  busPIRone  (BUSPAR ) 10 MG tablet Take 10 mg by mouth 2 (two) times daily. 10/25/23   [provider]  cinacalcet  (SENSIPAR ) 30 MG tablet Take 3 tablets (90 mg total) by mouth daily with supper. 06/14/23   Amin, Sumayya, MD  cloNIDine  (CATAPRES  - DOSED IN MG/24 HR) 0.3 mg/24hr patch Place 1 patch (0.3 mg  total) onto the skin once a week. 04/04/24   Christobal Guadalajara, MD  cycloSPORINE  (SANDIMMUNE ) 25 MG capsule Take 25 mg by mouth in the morning.    [provider]  cycloSPORINE  modified (NEORAL ) 100 MG capsule Take 2 capsules (200 mg total) by mouth 2 (two) times daily. 12/25/21   Pokhrel, Laxman, MD  Darbepoetin Alfa  (ARANESP ) 60 MCG/0.3ML SOSY injection Inject 0.3 mLs (60 mcg total) into the vein every Friday with hemodialysis. 11/18/21   Danton Reyes DASEN, MD  doxercalciferol  (HECTOROL ) 4 MCG/2ML injection Inject 3 mLs (6 mcg total) into the vein every Monday, Wednesday, and Friday with hemodialysis. 11/17/21   Danton Reyes DASEN, MD  DULoxetine  (CYMBALTA ) 20 MG capsule Take 1 capsule (20 mg total) by mouth daily. 02/06/22 11/06/23  Arlice Reichert, MD  ferric citrate  (AURYXIA ) 1 GM 210 MG(Fe) tablet Take 210-630 mg by mouth See admin instructions. Take 3 tablets (630 mg) by mouth three times daily with meals, and take 1 tablet (210 mg) twice daily with snacks    [provider]  gabapentin  (NEURONTIN ) 100 MG capsule Take 100 mg by mouth 2 (two) times daily. 10/25/23   [provider]  hydrALAZINE  (APRESOLINE ) 25 MG tablet Take 3 tablets (75 mg total) by mouth every 8 (eight) hours. 02/05/22 11/06/23  Arlice Reichert, MD  hydrOXYzine  (VISTARIL ) 25 MG  capsule Take 25 mg by mouth 3 (three) times daily as needed for anxiety. 05/26/23   [provider]  lacosamide  100 MG TABS Take 1 tablet (100 mg total) by mouth 2 (two) times daily. 03/31/24   Christobal Guadalajara, MD  lidocaine -prilocaine  (EMLA ) cream Apply 1 application  topically daily as needed (prior to port access). 07/09/20   [provider]  MELATONIN PO Take 1 tablet by mouth at bedtime.    [provider]  metoCLOPramide  (REGLAN ) 5 MG tablet Take 5 mg by mouth 2 (two) times daily before a meal.    [provider]  metoprolol  tartrate (LOPRESSOR ) 50 MG tablet Take 1 tablet (50 mg total) by mouth 2 (two) times daily.  03/31/24   Christobal Guadalajara, MD  multivitamin (RENA-VIT) TABS tablet Take 1 tablet by mouth at bedtime. 03/31/24   Christobal Guadalajara, MD  mycophenolate  (CELLCEPT ) 200 mg/mL oral suspension Take 2.5 mLs (500 mg total) by mouth 2 (two) times daily. 03/31/24   Christobal Guadalajara, MD  Nutritional Supplements (FEEDING SUPPLEMENT, NEPRO CARB STEADY,) LIQD Place 1,000 mLs into feeding tube continuous. 03/31/24   Christobal Guadalajara, MD  Nystatin (GERHARDT'S BUTT CREAM) CREA Apply 1 Application topically as needed for irritation. 03/31/24   Christobal Guadalajara, MD  ondansetron  (ZOFRAN ) 4 MG tablet Take 4 mg by mouth every 8 (eight) hours as needed for nausea or vomiting.    [provider]  oxyCODONE -acetaminophen  (PERCOCET/ROXICET) 5-325 MG tablet Take 1 tablet by mouth 3 (three) times daily.    [provider]  predniSONE  (DELTASONE ) 5 MG tablet Take 15 mg by mouth daily. 06/24/15   [provider]  sevelamer  carbonate (RENVELA ) 2.4 g PACK Take 2.4 g by mouth 3 (three) times daily with meals. 03/31/24   Christobal Guadalajara, MD  simethicone  (MYLICON) 40 MG/0.6ML drops Take 0.6 mLs (40 mg total) by mouth 4 (four) times daily as needed for flatulence. 03/31/24   Christobal Guadalajara, MD  XPHOZAH 30 MG TABS Take 1 tablet by mouth 2 (two) times daily. 06/21/23   [provider]  zolpidem  (AMBIEN ) 10 MG tablet Take 5 mg by mouth at bedtime as needed for sleep.    [provider]    Allergies: Patient has no known allergies.    Review of Systems  Updated Vital Signs BP 114/69   Pulse 100   Temp 98.3 F (36.8 C) (Axillary)   Resp 16   Wt 54.6 kg   SpO2 100%   BMI 18.57 kg/m   Physical Exam Vitals and nursing note reviewed.  Musculoskeletal:     Comments: Right upper extremity with dialysis clamps in place.  Neurological:     Comments: Patient nonverbal.  Gaze to the left.  Both arms held somewhat flexed.  No real response to pain.  Is breathing spontaneously however.     (all labs ordered are listed, but only  abnormal results are displayed) Labs Reviewed  CBC - Abnormal; Notable for the following components:      Result Value   Platelets 114 (*)    All other components within normal limits  DIFFERENTIAL - Abnormal; Notable for the following components:   Lymphs Abs 0.4 (*)    All other components within normal limits  COMPREHENSIVE METABOLIC PANEL WITH GFR - Abnormal; Notable for the following components:   Potassium 3.1 (*)    Chloride 95 (*)    Glucose, Bld 147 (*)    Creatinine, Ser 2.80 (*)    GFR, Estimated 21 (*)  Anion gap 19 (*)    All other components within normal limits  GLUCOSE, CAPILLARY - Abnormal; Notable for the following components:   Glucose-Capillary 144 (*)    All other components within normal limits  GLUCOSE, CAPILLARY - Abnormal; Notable for the following components:   Glucose-Capillary 154 (*)    All other components within normal limits  I-STAT CHEM 8, ED - Abnormal; Notable for the following components:   Potassium 3.0 (*)    Chloride 97 (*)    Creatinine, Ser 2.90 (*)    Glucose, Bld 154 (*)    Calcium , Ion 1.04 (*)    All other components within normal limits  POCT I-STAT 7, (LYTES, BLD GAS, ICA,H+H) - Abnormal; Notable for the following components:   pH, Arterial 7.610 (*)    pO2, Arterial 233 (*)    Bicarbonate 35.3 (*)    TCO2 36 (*)    Acid-Base Excess 13.0 (*)    Potassium 2.8 (*)    HCT 34.0 (*)    Hemoglobin 11.6 (*)    All other components within normal limits  MRSA NEXT GEN BY PCR, NASAL  PROTIME-INR  APTT  ETHANOL  HCG, SERUM, QUALITATIVE  CBC  BASIC METABOLIC PANEL WITH GFR  MAGNESIUM   PHOSPHORUS  PROTIME-INR  BLOOD GAS, ARTERIAL  CBG MONITORING, ED    EKG: None  Radiology: CT HEAD POST STROKE FOLLOWUP/TIMED/STAT READ Result Date: 05/19/2024 CLINICAL DATA:  Code stroke.  Neuro deficit, acute, stroke suspected EXAM: CT HEAD WITHOUT CONTRAST TECHNIQUE: Contiguous axial images were obtained from the base of the skull through  the vertex without intravenous contrast. RADIATION DOSE REDUCTION: This exam was performed according to the departmental dose-optimization program which includes automated exposure control, adjustment of the mA and/or kV according to patient size and/or use of iterative reconstruction technique. COMPARISON:  None Available. FINDINGS: Brain: No significant change and large volume of acute intraventricular hemorrhage and moderate hydrocephalus. Similar intraparenchymal hemorrhage in the right hippocampal region that is inseparable from the intraventricular hemorrhage. Similar small clustered areas of hyperdensity in the posterior left cerebellum which could represent calcification and/or hemorrhage. Similar involving left parietal infarct and chronic left cerebellar infarcts. No evidence of new/interval acute large vascular territory infarct. Vascular: No hyperdense vessel. Skull: Normal. Negative for fracture or focal lesion. Sinuses/Orbits: Clear sinuses.  No acute orbital findings. IMPRESSION: 1. No significant change in large volume of acute intraventricular hemorrhage and moderate hydrocephalus. 2. Similar intraparenchymal hemorrhage in the right hippocampal region that is inseparable from the intraventricular hemorrhage. 3. Similar small clustered areas of hyperdensity in the posterior left cerebellum which could represent calcification and/or hemorrhage. 4. Similar evolving left parietal infarct and chronic left cerebellar infarcts. Electronically Signed   By: Gilmore GORMAN Molt M.D.   On: 05/19/2024 22:51   DG CHEST PORT 1 VIEW Result Date: 05/19/2024 EXAM: 1 VIEW XRAY OF THE CHEST 05/19/2024 09:07:00 PM COMPARISON: 03/29/2024 CLINICAL HISTORY: Airway intubation performed without difficulty. Encounter for orogastric (OG) tube placement. FINDINGS: LINES, TUBES AND DEVICES: Endotracheal tube in place with tip 3.2 cm above the carina. Enteric tube in place with tip in proximal stomach and sidehole at level of  distal esophagus. Advancement of the catheter by 10 to 15 cm with more optimally positioned device. LUNGS AND PLEURA: No focal pulmonary opacity. No pulmonary edema. No pleural effusion. No pneumothorax. HEART AND MEDIASTINUM: Mild cardiomegaly. No acute abnormality of the cardiac and mediastinal silhouettes. BONES AND SOFT TISSUES: No acute osseous abnormality. IMPRESSION: 1. Endotracheal tube in  place with tip 3.2 cm above the carina. 2. Enteric tube in place with tip in proximal stomach and sidehole at level of distal esophagus. Advancement of the catheter by 10-15 cm is recommended for more optimal positioning. 3. Mild cardiomegaly. Electronically signed by: Dorethia Molt MD 05/19/2024 09:14 PM EDT RP Workstation: [id number]   CT HEAD CODE STROKE WO CONTRAST Result Date: 05/19/2024 CLINICAL DATA:  Code stroke. Neuro deficit, acute, stroke suspected. EXAM: CT HEAD WITHOUT CONTRAST TECHNIQUE: Contiguous axial images were obtained from the base of the skull through the vertex without intravenous contrast. RADIATION DOSE REDUCTION: This exam was performed according to the departmental dose-optimization program which includes automated exposure control, adjustment of the mA and/or kV according to patient size and/or use of iterative reconstruction technique. COMPARISON:  Head CT and MRI 03/10/2024 FINDINGS: Brain: There is new large volume acute hemorrhage filling the majority of the right lateral ventricle and smaller volume hemorrhage in the left lateral ventricle. There is increased, moderate dilatation of the lateral, third, and fourth ventricles. There is acute parenchymal hemorrhage in the medial right temporal lobe/hippocampus which is inseparable from the intraventricular hemorrhage. Small clustered hyperdense foci in the posterior aspect of the left cerebellar hemisphere may reflect residual/recurrent hemorrhage and/or mineralization. The left parietal infarct has evolved and demonstrates mild  encephalomalacia with resolution of gyral hyperdensity on the prior CT. A chronic infarct in the medial left cerebellar hemisphere is unchanged. No definite acute infarct, midline shift, or extra-axial fluid collection is identified. Vascular: Calcified atherosclerosis at the skull base. No hyperdense vessel. Skull: Right frontal burr hole. Sinuses/Orbits: Suspected mucous retention cyst in the left maxillary sinus. Bilateral mastoid effusions. Bilateral cataract extraction. Other: None. ASPECTS [facility] Stroke Program Early CT Score) Not scored in the presence of acute hemorrhage. These results were communicated to Dr. Arora at 4:30 pm on 05/19/2024 by text page via the [facility] messaging system. IMPRESSION: 1. Large volume acute intraventricular hemorrhage with moderate hydrocephalus. 2. Acute hemorrhage in the medial right temporal lobe, inseparable from the intraventricular hemorrhage. 3. Small foci of residual/recurrent hemorrhage and/or mineralization in the left cerebellar hemisphere. Electronically Signed   By: Dasie Hamburg M.D.   On: 05/19/2024 16:32     Procedures   Medications Ordered in the ED  sodium chloride  flush (NS) 0.9 % injection 3 mL ( Intravenous Canceled Entry 05/19/24 1936)  labetalol  (NORMODYNE ) injection 20 mg (20 mg Intravenous Not Given 05/19/24 1936)    And  clevidipine  (CLEVIPREX ) infusion 0.5 mg/mL (10 mg/hr Intravenous IV Pump Association 05/19/24 2253)  LORazepam  (ATIVAN ) 2 MG/ML injection (  Not Given 05/19/24 1936)  docusate sodium  (COLACE) capsule 100 mg (has no administration in time range)  polyethylene glycol (MIRALAX  / GLYCOLAX ) packet 17 g (has no administration in time range)  insulin  aspart (novoLOG ) injection 0-9 Units (1 Units Subcutaneous Given 05/19/24 1948)   stroke: early stages of recovery book (has no administration in time range)  acetaminophen  (TYLENOL ) tablet 650 mg (has no administration in time range)    Or  acetaminophen  (TYLENOL ) 160 MG/5ML solution  650 mg (has no administration in time range)    Or  acetaminophen  (TYLENOL ) suppository 650 mg (has no administration in time range)  senna-docusate (Senokot-S) tablet 1 tablet (1 tablet Oral Not Given 05/19/24 2251)  pantoprazole  (PROTONIX ) injection 40 mg (40 mg Intravenous Given 05/19/24 2249)   stroke: early stages of recovery book (has no administration in time range)  norepinephrine  (LEVOPHED ) 4-5 MG/250ML-% infusion SOLN (  Canceled Entry 05/19/24  1932)  0.9 %  sodium chloride  infusion (has no administration in time range)  dexmedetomidine  (PRECEDEX ) 400 MCG/100ML (4 mcg/mL) infusion (0.4 mcg/kg/hr  54.6 kg Intravenous Infusion Verify 05/19/24 2100)  Chlorhexidine  Gluconate Cloth 2 % PADS 6 each (has no administration in time range)  Oral care mouth rinse (15 mLs Mouth Rinse Given 05/19/24 2251)  Oral care mouth rinse (has no administration in time range)  hydrALAZINE  (APRESOLINE ) injection 20 mg (20 mg Intramuscular Given 05/19/24 1627)  LORazepam  (ATIVAN ) injection 1 mg (1 mg Intramuscular Given 05/19/24 1632)  levETIRAcetam  (KEPPRA ) undiluted injection 2,000 mg (2,000 mg Intravenous Given 05/19/24 1708)  lidocaine -EPINEPHrine  (XYLOCAINE  W/EPI) 2 %-1:200000 (PF) injection (20 mLs  Given 05/19/24 1738)  rocuronium  (ZEMURON ) 100 MG/10ML injection (100 mg  Given 05/19/24 1830)  etomidate  (AMIDATE ) 2 MG/ML injection (40 mg  Given 05/19/24 1836)  rocuronium  (ZEMURON ) 100 MG/10ML injection (100 mg  Given 05/19/24 1837)                                    Medical Decision Making Amount and/or Complexity of Data Reviewed Labs: ordered. Radiology: ordered.  Risk Prescription drug management. Decision regarding hospitalization.   Patient presented as a code stroke.  Large intra ventricular hemorrhage coming from right medial temporal lobe hemorrhage.  Decreased mental status.  With hydrocephalus.  Difficult IV access due to for dialysis.  Initially tried ultrasound-guided line to left upper  arm but had extravasation.  Foot access was obtained.  On IV blood pressure treatment.  Discussed with Dr. Joshua with neurosurgery who placed a ventricular drain.  Discussed with ICU, who will admit patient.  Also discussed with family.  They reassured patient is full code at this time but believe patient will need more goals of care discussion.   CRITICAL CARE Performed by: [place] Total critical care time: 45 minutes Critical care time was exclusive of separately billable procedures and treating other patients. Critical care was necessary to treat or prevent imminent or life-threatening deterioration. Critical care was time spent personally by me on the following activities: development of treatment plan with patient and/or surrogate as well as nursing, discussions with consultants, evaluation of patient's response to treatment, examination of patient, obtaining history from patient or surrogate, ordering and performing treatments and interventions, ordering and review of laboratory studies, ordering and review of radiographic studies, pulse oximetry and re-evaluation of patient's condition.       Final diagnoses:  None    ED Discharge Orders     None          River Rankin, MD 05/19/24 2328

## 2024-05-19 NOTE — Progress Notes (Signed)
 eLink Physician-Brief Progress Note Patient Name: [name] [name] DOB: [birth year] MRN: [id number]   Date of Service  05/19/2024  HPI/Events of Note  43 year old woman who presented to [facility] ED 9/15 with ICH complicated by intraventricular hemorrhage and hydrocephalus.  ABG performed-pH 7.6, pCO2 35, PaO2 233  eICU Interventions  Reduce respiratory rate to 12, tidal volume to 6 cc/kg Monitor ins/outs   0610 - Kcl  Intervention Category Intermediate Interventions: Respiratory distress - evaluation and management  Dallin Mccorkel 05/19/2024, 9:46 PM

## 2024-05-19 NOTE — Progress Notes (Signed)
 Patient received from ED obtunded and not following commands, with one PIV in foot. Stage 2 pressure injury to sacrum noted on admission. EVD drainage at 15 cc when received from ED. CCM at the bedside and witnessed emesis episode. IO access gained x 2. Pt intubated and placed on precedex . Patient's mother at the bedside and updated.

## 2024-05-19 NOTE — Procedures (Signed)
 Intubation Procedure Note  [name] [name]  [id number]  October 15, 1980  Date:05/19/24  Time:6:42 PM   Provider Performing:Xavian Hardcastle    Procedure: Intubation (31500)  Indication(s) Respiratory Failure  Consent Risks of the procedure as well as the alternatives and risks of each were explained to the patient and/or caregiver.  Consent for the procedure was obtained and is signed in the bedside chart   Anesthesia Etomidate  and Rocuronium    Time Out Verified patient identification, verified procedure, site/side was marked, verified correct patient position, special equipment/implants available, medications/allergies/relevant history reviewed, required imaging and test results available.   Sterile Technique Usual hand hygeine, masks, and gloves were used   Procedure Description Patient positioned in bed supine.  Sedation given as noted above.  Patient was intubated with endotracheal tube using DL.  View was Grade 1 full glottis .  Number of attempts was 1.  Colorimetric CO2 detector was consistent with tracheal placement.   Complications/Tolerance None; patient tolerated the procedure well. Chest X-ray is ordered to verify placement.   EBL Minimal   Specimen(s) None

## 2024-05-19 NOTE — Code Documentation (Addendum)
 Stroke Response Nurse Documentation Code Documentation  [name] [name] is a 43 y.o. female arriving to [facility]  via [facility] on 05/19/2024 with past medical hx of ESRD, ICH. On No antithrombotic. Code stroke was activated by EMS.   Patient from dialysis where she was LKW at 1450 and now complaining of obtundation, lt hemiparesis.  Stroke team at the bedside on patient arrival. Labs drawn and patient cleared for CT by Dr. Patsey. Patient to CT with team. NIHSS 31, see documentation for details and code stroke times. Patient with decreased LOC, disoriented, not following commands, bilateral hemianopia, bilateral facial droop, bilateral arm weakness, bilateral leg weakness, Global aphasia , dysarthria , and Visual  neglect on exam. The following imaging was completed:  CT Head. Patient is not a candidate for IV Thrombolytic due to ICH. Patient is not a candidate for IR due to ICH.   Care Plan:  Hemorrhage: q1h NIHSS, VS, and pupils  BP goal 130-150   Process Delays Noted: Very difficult IV access  Bedside handoff with ED RN Camie.    Sandor Arboleda Livengood  Stroke Response RN

## 2024-05-19 NOTE — Consult Note (Signed)
 Reason for Consult:IVH Referring Physician: EDP  Uzma Naseer Gelles is an 43 y.o. female.   HPI:  43 year old female presented to the ED today with AMS during dialysis. 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 She required an EVD at her last visit in July when Dr. Mavis took care of her. Patient is not able to answer questions or follow commands.   Past Medical History:  Diagnosis Date   Anemia    Anxiety    DM (diabetes mellitus), type 1 (HCC)    ESRD on hemodialysis (HCC)    Gastroparesis    History of simultaneous kidney and pancreas transplant (HCC) 2014   Hypertension    Ischemic cerebrovascular accident (CVA) (HCC) 11/2021   Narcotic abuse (HCC)    Non compliance with medical treatment    Stroke [facility])    Vision loss     Past Surgical History:  Procedure Laterality Date   A/V FISTULAGRAM Right 02/17/2021   Procedure: A/V FISTULAGRAM;  Surgeon: Gretta Lonni PARAS, MD;  [facility]: [facility] INVASIVE CV LAB;  Service: Cardiovascular;  Laterality: Right;   AV FISTULA PLACEMENT  11/17/2011   Procedure: ARTERIOVENOUS (AV) FISTULA CREATION;  Surgeon: Krystal JULIANNA Doing, MD;  Location: [facility] OR;  Service: Vascular;  Laterality: Right;   BIOPSY  12/03/2021   Procedure: BIOPSY;  Surgeon: Legrand Victory LITTIE DOUGLAS, MD;  Location: [facility] ENDOSCOPY;  Service: Gastroenterology;;   ESOPHAGOGASTRODUODENOSCOPY (EGD) WITH PROPOFOL  N/A 12/03/2021   Procedure: ESOPHAGOGASTRODUODENOSCOPY (EGD) WITH PROPOFOL ;  Surgeon: Legrand Victory LITTIE DOUGLAS, MD;  Location: [facility] ENDOSCOPY;  Service: Gastroenterology;  Laterality: N/A;   EYE SURGERY     HEMATOMA EVACUATION Right 02/25/2021   Procedure: EVACUATION HEMATOMA RIGHT CHEST;  Surgeon: Sheree Penne Lonni, MD;  Location: [facility] OR;  Service: Vascular;  Laterality: Right;   INSERTION OF DIALYSIS CATHETER Right 12/08/2020   Procedure: INSERTION OF TUNNELED DIALYSIS CATHETER;  Surgeon: Gretta Lonni PARAS, MD;  Location: [facility] OR;  Service: Vascular;  Laterality: Right;   IR ANGIO INTRA EXTRACRAN SEL INTERNAL CAROTID BILAT MOD SED  02/27/2024   IR ANGIO VERTEBRAL SEL VERTEBRAL UNI R MOD SED  02/27/2024   IR GASTROSTOMY TUBE MOD SED  03/20/2024   IR GJ TUBE CHANGE  04/15/2024   IR REMOVAL TUN CV CATH W/O FL  05/03/2021   KIDNEY TRANSPLANT     NEPHRECTOMY TRANSPLANTED ORGAN     pancrease transplant     PERIPHERAL VASCULAR BALLOON ANGIOPLASTY Right 02/17/2021   Procedure: PERIPHERAL VASCULAR BALLOON ANGIOPLASTY;  Surgeon: Gretta Lonni PARAS, MD;  [facility] INVASIVE CV LAB;  Service: Cardiovascular;  Laterality: Right;   REFRACTIVE SURGERY     REVISON OF ARTERIOVENOUS FISTULA Right 12/08/2020   Procedure: RIGHT ARM ARTERIOVENOUS FISTULA REVISION AND RESECTION;  Surgeon: Gretta Lonni PARAS, MD;  Location: [facility] OR;  Service: Vascular;  Laterality: Right;   REVISON OF ARTERIOVENOUS FISTULA Right 02/25/2021   Procedure: RIGHT ARM ARTERIOVENOUS FISTULA REVISION WITH TRANSPOSITION OF CEPHALIC VEIN ON AXILLARY VEIN;  Surgeon: Gretta Lonni PARAS, MD;  Location: [facility] OR;  Service: Vascular;  Laterality: Right;    No Known Allergies  Social History   Tobacco Use   Smoking status: Former    Types: Pipe   Smokeless tobacco: Never   Tobacco comments:    Hooka  Substance Use Topics   Alcohol  use: No    Family History  Problem Relation Age of Onset  Hypertension Father      Review of Systems  Positive ROS: as above  All other systems have been reviewed and were otherwise negative with the exception of those mentioned in the HPI and as above.  Objective: Vital signs in last 24 hours: Pulse Rate:  [73-99] 99 (09/15 1645) Resp:  [22-28] 28 (09/15 1645) BP: (159-224)/(67-107) 194/67 (09/15 1645) SpO2:  [100 %] 100 % (09/15 1645) Weight:  [54.6 kg] 54.6 kg (09/15 1600)  General Appearance: lethargic Head: Normocephalic, without obvious abnormality, atraumatic Eyes: PERRL, conjunctiva/corneas  clear, fundi benign, both eyes, left gaze preference  Lungs:  respirations unlabored Heart: Regular rate and rhythm Extremities: Extremities normal, atraumatic, no cyanosis or edema Pulses: 2+ and symmetric all extremities Skin: Skin color, texture, turgor normal, no rashes or lesions  NEUROLOGIC:   Mental status: lethargic, unable to [facility] Motor Exam - MAE spontaneously  Sensory Exam - grossly normal Reflexes: symmetric, no pathologic reflexes, No Hoffman's, No clonus Coordination -not tested Gait -not tested Balance - not tested Cranial Nerves: I: smell Not tested  II: visual acuity  OS: na    OD: na  II: visual fields   II: pupils Equal, round, reactive to light  III,VII: ptosis None  III,IV,VI: extraocular muscles  Left gaze preference  V: mastication   V: facial light touch sensation    V,VII: corneal reflex    VII: facial muscle function - upper    VII: facial muscle function - lower   VIII: hearing   IX: soft palate elevation    IX,X: gag reflex   XI: trapezius strength    XI: sternocleidomastoid strength   XI: neck flexion strength    XII: tongue strength      Data Review Lab Results  Component Value Date   WBC 4.8 05/19/2024   HGB 12.9 05/19/2024   HCT 38.0 05/19/2024   MCV 98.2 05/19/2024   PLT 114 (L) 05/19/2024   Lab Results  Component Value Date   NA 139 05/19/2024   K 3.0 (L) 05/19/2024   CL 97 (L) 05/19/2024   CO2 24 05/19/2024   BUN 9 05/19/2024   CREATININE 2.90 (H) 05/19/2024   GLUCOSE 154 (H) 05/19/2024   Lab Results  Component Value Date   INR 1.1 05/19/2024    Radiology: CT HEAD CODE STROKE WO CONTRAST Result Date: 05/19/2024 CLINICAL DATA:  Code stroke. Neuro deficit, acute, stroke suspected. EXAM: CT HEAD WITHOUT CONTRAST TECHNIQUE: Contiguous axial images were obtained from the base of the skull through the vertex without intravenous contrast. RADIATION DOSE REDUCTION: This exam was performed according to the departmental  dose-optimization program which includes automated exposure control, adjustment of the mA and/or kV according to patient size and/or use of iterative reconstruction technique. COMPARISON:  Head CT and MRI 03/10/2024 FINDINGS: Brain: There is new large volume acute hemorrhage filling the majority of the right lateral ventricle and smaller volume hemorrhage in the left lateral ventricle. There is increased, moderate dilatation of the lateral, third, and fourth ventricles. There is acute parenchymal hemorrhage in the medial right temporal lobe/hippocampus which is inseparable from the intraventricular hemorrhage. Small clustered hyperdense foci in the posterior aspect of the left cerebellar hemisphere may reflect residual/recurrent hemorrhage and/or mineralization. The left parietal infarct has evolved and demonstrates mild encephalomalacia with resolution of gyral hyperdensity on the prior CT. A chronic infarct in the medial left cerebellar hemisphere is unchanged. No definite acute infarct, midline shift, or extra-axial fluid collection is identified. Vascular: Calcified atherosclerosis at  the skull base. No hyperdense vessel. Skull: Right frontal burr hole. Sinuses/Orbits: Suspected mucous retention cyst in the left maxillary sinus. Bilateral mastoid effusions. Bilateral cataract extraction. Other: None. ASPECTS [facility] Stroke Program Early CT Score) Not scored in the presence of acute hemorrhage. These results were communicated to Dr. Arora at 4:30 pm on 05/19/2024 by text page via the [facility] messaging system. IMPRESSION: 1. Large volume acute intraventricular hemorrhage with moderate hydrocephalus. 2. Acute hemorrhage in the medial right temporal lobe, inseparable from the intraventricular hemorrhage. 3. Small foci of residual/recurrent hemorrhage and/or mineralization in the left cerebellar hemisphere. Electronically Signed   By: Dasie Hamburg M.D.   On: 05/19/2024 16:32     Assessment/Plan: 43 year old female  presented with AMS at dialysis. CT head shows hydrocephalus with right IVH and right temporal ICH, no midline shift. We did place and EVD in the ED at bedside. Discussed the plan of care with her mother and she consented. Placed EVD at 10 of water . Will let Dr. Mavis know of her admission for tomorrow. Repeat head CT in the morning. See separate note for EVD procedure.   Suzen Lacks [facility] 05/19/2024 5:39 PM

## 2024-05-19 NOTE — Progress Notes (Signed)
 Pt hypertensive, no IV access, 3 RNs have attempted, EDP attempting USG IV, Rt arm restricted, BP cuff on leg. Hydralazine  and Ativan  given IM.

## 2024-05-19 NOTE — ED Triage Notes (Signed)
 Pt BIB [facility] from dialysis center for code stroke. Pt has L sided deficits with L-sided gaze, incoherent speech, not following commands. BP in 200s systolic per EMS.

## 2024-05-19 NOTE — H&P (Signed)
 NAME:  [name] [name], MRN:  [id number], DOB:  1981-05-23, LOS: 0 ADMISSION DATE:  05/19/2024 CONSULTATION DATE:  9/15/20025 REFERRING MD: Patsey - EDP, CHIEF COMPLAINT:  Code Stroke, ICH   History of Present Illness:  43 year old woman who presented to [facility] ED 9/15 as a Code Stroke. 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 Recent admissions 6/11-7/28 and 7/29-8/13 for ICH.  Patient presented to [facility] ED from dialysis as a Code Stroke; reportedly LKW 1450 with development of obtundation and hemiparesis. SBP 200s. NIHSS 31 with initial leftward gaze, decreased LOC/disorientation, bilateral facial droop/arm weakness/leg weakness, global aphasia, dysarthria, visual neglect. CT Head demonstrated large volume IVH with moderate hydrocephalus, acute hemorrhage of medial R temporal lobe and small foci of residual/recurrent hemorrhage in the L cerebellar hemisphere.   Patient was seen by stroke team and neurosurgery, EVD was placed on left side, PCCM was consulted for medical evaluation and admission to neuro ICU  Pertinent Medical History:   Past Medical History:  Diagnosis Date   Anemia    Anxiety    DM (diabetes mellitus), type 1 (HCC)    ESRD on hemodialysis (HCC)    Gastroparesis    History of simultaneous kidney and pancreas transplant (HCC) 2014   Hypertension    Ischemic cerebrovascular accident (CVA) (HCC) 11/2021   Narcotic abuse (HCC)    Non compliance with medical treatment    Stroke [facility])    Vision loss    Significant Hospital Events: Including procedures, antibiotic start and stop dates in addition to other pertinent events   Admitted with ICH plus IVH, EVD was placed for hydrocephalus  Interim History / Subjective:  As above  Objective:  Blood pressure (!) 194/67, pulse 99, resp. rate (!) 28, weight 54.6 kg,  SpO2 100%.        Intake/Output Summary (Last 24 hours) at 05/19/2024 1732 Last data filed at 05/19/2024 1649 Gross per 24 hour  Intake 0.25 ml  Output --  Net 0.25 ml   Filed Weights   05/19/24 1600  Weight: 54.6 kg   Physical Examination: General: Acute on chronically ill-appearing female, lying on the bed HEENT: /AT, eyes anicteric.  moist mucus membranes.  EVD is placed on left side, draining bloody CSF Neuro: Awake, not following commands, moaning to painful stimuli Chest: Coarse breath sounds, no wheezes or rhonchi Heart: Regular rate and rhythm, no murmurs or gallops Abdomen: Soft, nontender, nondistended, bowel sounds present  Labs and images reviewed  Patient Lines/Drains/Airways Status     Active Line/Drains/Airways     Name Placement date Placement time Site Days   Peripheral IV 05/19/24 22 G Anterior;Right Foot 05/19/24  1641  Foot  less than 1   Fistula / Graft Right Other (Comment) Hemodialysis catheter --  --  Other (Comment)  --   Fistula / Graft Right Upper arm Arteriovenous fistula 12/08/20  1403  Upper arm  1258   NG/OG Vented/Dual Lumen 10 Fr. Right nare 03/20/24  1700  Right nare  60   Gastrostomy/Enterostomy Gastrostomy;Jejunostomy 18 Fr. LUQ 04/15/24  1329  LUQ  34         Resolved Hospital Problem List:    Assessment & Plan:  Acute right parietotemporal intraparenchymal hemorrhage with IVH with ICH score 2 due to uncontrolled hypertension Obstructive hydrocephalus status post EVD Acute encephalopathy in the setting of ICH and hydrocephalus Hypertensive emergency Acute  respiratory insufficiency End-stage renal disease on hemodialysis with failed renal transplant Type 1 diabetes, complicated with vasculopathy and gastroparesis Narcotic abuse  Continue neuro watch Stroke team is following Keep EVD at 10 cm water  Neurosurgery is following Monitor output Maintain SBP 130-180 Avoid antiplatelet and anticoagulation Continue clevidipine   infusion Patient is vomiting, will proceed with endotracheal intubation considering she is not able to protect her airway Continue lung protective ventilation VAP prevention bundle in place PAD protocol with Precedex  Will consult nephrology in the morning for hemodialysis need Continue insulin  with CBG goal 140-180  Labs:  CBC: Recent Labs  Lab 05/19/24 1610 05/19/24 1615  WBC 4.8  --   NEUTROABS 4.2  --   HGB 12.2 12.9  HCT 38.5 38.0  MCV 98.2  --   PLT 114*  --    Basic Metabolic Panel: Recent Labs  Lab 05/19/24 1610 05/19/24 1615  NA 138 139  K 3.1* 3.0*  CL 95* 97*  CO2 24  --   GLUCOSE 147* 154*  BUN 10 9  CREATININE 2.80* 2.90*  CALCIUM  9.3  --    GFR: Estimated Creatinine Clearance: 21.8 mL/min (A) (by C-G formula based on SCr of 2.9 mg/dL (H)). Recent Labs  Lab 05/19/24 1610  WBC 4.8   Liver Function Tests: Recent Labs  Lab 05/19/24 1610  AST 23  ALT 19  ALKPHOS 88  BILITOT 0.9  PROT 7.3  ALBUMIN 3.6   No results for input(s): LIPASE, AMYLASE in the last 168 hours. No results for input(s): AMMONIA in the last 168 hours.  ABG:    Component Value Date/Time   PHART 7.541 (H) 02/23/2024 1630   PCO2ART 31.1 (L) 02/23/2024 1630   PO2ART 68 (L) 02/23/2024 1630   HCO3 26.5 02/23/2024 1630   TCO2 29 05/19/2024 1615   ACIDBASEDEF 5.2 (H) 01/13/2015 0300   O2SAT 95 02/23/2024 1630   Coagulation Profile: Recent Labs  Lab 05/19/24 1610  INR 1.1   Cardiac Enzymes: No results for input(s): CKTOTAL, CKMB, CKMBINDEX, TROPONINI in the last 168 hours.  HbA1C: Hgb A1c MFr Bld  Date/Time Value Ref Range Status  02/14/2024 09:49 AM 4.2 (L) 4.8 - 5.6 % Final    Comment:    (NOTE) Diagnosis of Diabetes The following HbA1c ranges recommended by the American Diabetes Association (ADA) may be used as an aid in the diagnosis of diabetes mellitus.  Hemoglobin             Suggested A1C NGSP%              Diagnosis  <5.7                    Non Diabetic  5.7-6.4                Pre-Diabetic  >6.4                   Diabetic  <7.0                   Glycemic control for                       adults with diabetes.    11/11/2021 02:25 AM 4.1 (L) 4.8 - 5.6 % Final    Comment:    (NOTE) Pre diabetes:          5.7%-6.4%  Diabetes:              >6.4%  Glycemic control for   <7.0% adults with diabetes    CBG: No results for input(s): GLUCAP in the last 168 hours.  Review of Systems:   Patient is encephalopathic and/or intubated; therefore, history has been obtained from chart review.   Past Medical History:  She,  has a past medical history of Anemia, Anxiety, DM (diabetes mellitus), type 1 (HCC), ESRD on hemodialysis (HCC), Gastroparesis, History of simultaneous kidney and pancreas transplant (HCC) (2014), Hypertension, Ischemic cerebrovascular accident (CVA) (HCC) (11/2021), Narcotic abuse (HCC), Non compliance with medical treatment, Stroke (HCC), and Vision loss.   Surgical History:   Past Surgical History:  Procedure Laterality Date   A/V FISTULAGRAM Right 02/17/2021   Procedure: A/V FISTULAGRAM;  Surgeon: Gretta Lonni PARAS, MD;  Location: [facility] INVASIVE CV LAB;  Service: Cardiovascular;  Laterality: Right;   AV FISTULA PLACEMENT  11/17/2011   Procedure: ARTERIOVENOUS (AV) FISTULA CREATION;  Surgeon: Krystal JULIANNA Doing, MD;  Location: [facility] OR;  Service: Vascular;  Laterality: Right;   BIOPSY  12/03/2021   Procedure: BIOPSY;  Surgeon: Legrand Victory LITTIE DOUGLAS, MD;  Location: [facility] ENDOSCOPY;  Service: Gastroenterology;;   ESOPHAGOGASTRODUODENOSCOPY (EGD) WITH PROPOFOL  N/A 12/03/2021   Procedure: ESOPHAGOGASTRODUODENOSCOPY (EGD) WITH PROPOFOL ;  Surgeon: Legrand Victory LITTIE DOUGLAS, MD;  Location: [facility] ENDOSCOPY;  Service: Gastroenterology;  Laterality: N/A;   EYE SURGERY     HEMATOMA EVACUATION Right 02/25/2021   Procedure: EVACUATION HEMATOMA RIGHT CHEST;  Surgeon: Sheree Penne Lonni, MD;  Location: [facility] OR;  Service: Vascular;  Laterality:  Right;   INSERTION OF DIALYSIS CATHETER Right 12/08/2020   Procedure: INSERTION OF TUNNELED DIALYSIS CATHETER;  Surgeon: Gretta Lonni PARAS, MD;  Location: [facility] OR;  Service: Vascular;  Laterality: Right;   IR ANGIO INTRA EXTRACRAN SEL INTERNAL CAROTID BILAT MOD SED  02/27/2024   IR ANGIO VERTEBRAL SEL VERTEBRAL UNI R MOD SED  02/27/2024   IR GASTROSTOMY TUBE MOD SED  03/20/2024   IR GJ TUBE CHANGE  04/15/2024   IR REMOVAL TUN CV CATH W/O FL  05/03/2021   KIDNEY TRANSPLANT     NEPHRECTOMY TRANSPLANTED ORGAN     pancrease transplant     PERIPHERAL VASCULAR BALLOON ANGIOPLASTY Right 02/17/2021   Procedure: PERIPHERAL VASCULAR BALLOON ANGIOPLASTY;  Surgeon: Gretta Lonni PARAS, MD;  [facility]: [facility] INVASIVE CV LAB;  Service: Cardiovascular;  Laterality: Right;   REFRACTIVE SURGERY     REVISON OF ARTERIOVENOUS FISTULA Right 12/08/2020   Procedure: RIGHT ARM ARTERIOVENOUS FISTULA REVISION AND RESECTION;  Surgeon: Gretta Lonni PARAS, MD;  Location: [facility] OR;  Service: Vascular;  Laterality: Right;   REVISON OF ARTERIOVENOUS FISTULA Right 02/25/2021   Procedure: RIGHT ARM ARTERIOVENOUS FISTULA REVISION WITH TRANSPOSITION OF CEPHALIC VEIN ON AXILLARY VEIN;  Surgeon: Gretta Lonni PARAS, MD;  Location: [facility] OR;  Service: Vascular;  Laterality: Right;   Social History:   reports that she has quit smoking. Her smoking use included pipe. She has never used smokeless tobacco. She reports that she does not drink alcohol  and does not use drugs.   Family History:  Her family history includes Hypertension in her father.   Allergies: No Known Allergies   Home Medications: Prior to Admission medications   Medication Sig Start Date End Date Taking? Authorizing Provider  albuterol  (PROVENTIL ) (2.5 MG/3ML) 0.083% nebulizer solution Take 3 mLs (2.5 mg total) by nebulization every 4 (four) hours as needed for wheezing or shortness of breath. 03/31/24   Christobal Guadalajara, MD  amLODipine  (NORVASC ) 10 MG tablet Take 1 tablet (10 mg  total) by mouth daily. 02/05/22 11/06/23  Arlice Reichert, MD  artificial tears ophthalmic solution Place 1 drop into the left eye as needed for dry eyes. 03/31/24   Christobal Guadalajara, MD  aspirin  EC 81 MG tablet Chew 81 mg by mouth daily.    [provider]  busPIRone  (BUSPAR ) 10 MG tablet Take 10 mg by mouth 2 (two) times daily. 10/25/23   [provider]  cinacalcet  (SENSIPAR ) 30 MG tablet Take 3 tablets (90 mg total) by mouth daily with supper. 06/14/23   Amin, Sumayya, MD  cloNIDine  (CATAPRES  - DOSED IN MG/24 HR) 0.3 mg/24hr patch Place 1 patch (0.3 mg total) onto the skin once a week. 04/04/24   Christobal Guadalajara, MD  cycloSPORINE  (SANDIMMUNE ) 25 MG capsule Take 25 mg by mouth in the morning.    [provider]  cycloSPORINE  modified (NEORAL ) 100 MG capsule Take 2 capsules (200 mg total) by mouth 2 (two) times daily. 12/25/21   Pokhrel, Laxman, MD  Darbepoetin Alfa  (ARANESP ) 60 MCG/0.3ML SOSY injection Inject 0.3 mLs (60 mcg total) into the vein every Friday with hemodialysis. 11/18/21   Danton Reyes DASEN, MD  doxercalciferol  (HECTOROL ) 4 MCG/2ML injection Inject 3 mLs (6 mcg total) into the vein every Monday, Wednesday, and Friday with hemodialysis. 11/17/21   Danton Reyes DASEN, MD  DULoxetine  (CYMBALTA ) 20 MG capsule Take 1 capsule (20 mg total) by mouth daily. 02/06/22 11/06/23  Arlice Reichert, MD  ferric citrate  (AURYXIA ) 1 GM 210 MG(Fe) tablet Take 210-630 mg by mouth See admin instructions. Take 3 tablets (630 mg) by mouth three times daily with meals, and take 1 tablet (210 mg) twice daily with snacks    [provider]  gabapentin  (NEURONTIN ) 100 MG capsule Take 100 mg by mouth 2 (two) times daily. 10/25/23   [provider]  hydrALAZINE  (APRESOLINE ) 25 MG tablet Take 3 tablets (75 mg total) by mouth every 8 (eight) hours. 02/05/22 11/06/23  Arlice Reichert, MD  hydrOXYzine  (VISTARIL ) 25 MG capsule Take 25 mg by mouth 3 (three) times daily as needed for anxiety. 05/26/23    [provider]  lacosamide  100 MG TABS Take 1 tablet (100 mg total) by mouth 2 (two) times daily. 03/31/24   Christobal Guadalajara, MD  lidocaine -prilocaine  (EMLA ) cream Apply 1 application  topically daily as needed (prior to port access). 07/09/20   [provider]  MELATONIN PO Take 1 tablet by mouth at bedtime.    [provider]  metoCLOPramide  (REGLAN ) 5 MG tablet Take 5 mg by mouth 2 (two) times daily before a meal.    [provider]  metoprolol  tartrate (LOPRESSOR ) 50 MG tablet Take 1 tablet (50 mg total) by mouth 2 (two) times daily. 03/31/24   Christobal Guadalajara, MD  multivitamin (RENA-VIT) TABS tablet Take 1 tablet by mouth at bedtime. 03/31/24   Christobal Guadalajara, MD  mycophenolate  (CELLCEPT ) 200 mg/mL oral suspension Take 2.5 mLs (500 mg total) by mouth 2 (two) times daily. 03/31/24   Christobal Guadalajara, MD  Nutritional Supplements (FEEDING SUPPLEMENT, NEPRO CARB STEADY,) LIQD Place 1,000 mLs into feeding tube continuous. 03/31/24   Christobal Guadalajara, MD  Nystatin (GERHARDT'S BUTT CREAM) CREA Apply 1 Application topically as needed for irritation. 03/31/24   Christobal Guadalajara, MD  ondansetron  (ZOFRAN ) 4 MG tablet Take 4 mg by mouth every 8 (eight) hours as needed for nausea or vomiting.    [provider]  oxyCODONE -acetaminophen  (PERCOCET/ROXICET) 5-325 MG tablet Take 1 tablet by mouth 3 (three) times daily.  [provider]  predniSONE  (DELTASONE ) 5 MG tablet Take 15 mg by mouth daily. 06/24/15   [provider]  sevelamer  carbonate (RENVELA ) 2.4 g PACK Take 2.4 g by mouth 3 (three) times daily with meals. 03/31/24   Christobal Guadalajara, MD  simethicone  (MYLICON) 40 MG/0.6ML drops Take 0.6 mLs (40 mg total) by mouth 4 (four) times daily as needed for flatulence. 03/31/24   Christobal Guadalajara, MD  XPHOZAH 30 MG TABS Take 1 tablet by mouth 2 (two) times daily. 06/21/23   [provider]  zolpidem  (AMBIEN ) 10 MG tablet Take 5 mg by mouth at bedtime as needed for sleep.    [provider]   The patient is critically ill due to acute right parietotemporal intraparenchymal hemorrhage with IVH in the setting of hypertensive emergency.  Critical care was necessary to treat or prevent imminent or life-threatening deterioration.  Critical care was time spent personally by me on the following activities: development of treatment plan with patient and/or surrogate as well as nursing, discussions with consultants, evaluation of patient's response to treatment, examination of patient, obtaining history from patient or surrogate, ordering and performing treatments and interventions, ordering and review of laboratory studies, ordering and review of radiographic studies, pulse oximetry, re-evaluation of patient's condition and participation in multidisciplinary rounds.   During this encounter critical care time was devoted to patient care services described in this note for 44 minutes.     Valinda Novas, MD [facility] See Amion for pager If no response to pager, please call [phone] until 7pm After 7pm, Please call E-link [phone]

## 2024-05-19 NOTE — Procedures (Cosign Needed Addendum)
 Risks and benefits were discussed with the patient and family at bedside. Consent obtained. Initial insertion site was marked midpupilary line just behind the hairline on the right. Patient was prepped and draped in sterile fashion. Vital signs stable after administration of ativan  and patient resting comfortably. 3ml of Lidocaine  was used for local anesthetic. Incision with a scalpel was made at the insertion point that was already determined. Hand drill was then used to make a small craniotomy. Dura was felt and then punctured with a needle. Catheter was inserted and advanced until CSF return was seen. Advanced the catheter to 7mm. CSF flow still present. Catheter was then tunneled through a separate insertion site and sutured down securely with nylon suture. Initial incision was suture closed with nylon as well. CSF flow through catheter still patent. Catheter was connected to external drainage system and placed at 10mm of H2O. Sterile dressing applied. Patient tolerated the procedure well and vital signs were stable throughout. Dr. Joshua was present during the placement of the EVD.

## 2024-05-19 NOTE — Consult Note (Signed)
 NEUROLOGY CONSULT NOTE   Date of service: May 19, 2024 Patient Name: [name] [name] MRN:  [id number] DOB:  Jan 23, 1981 Chief Complaint: Leftward gaze, left-sided weakness  History of Present Illness  [name] [name] is a 43 y.o. female with hx of hypertension, ESRD on HD status post renal and pancreatic transplants, diabetes type 1, gastroparesis, multiple strokes both ischemic and hemorrhagic along with prepontine subarachnoid hemorrhage which was angio negative, and during last admission had hydrocephalus requiring EVD placement, then discharged to a nursing facility-today had a change in mentation at dialysis where she had a leftward gaze and left-sided weakness for which EMS was called. Her blood pressures were in the systolic 200s.  She had a leftward gaze, unresponsive to voice and weaker on the left side.  She was evaluated emergently in the ER.  A head CT was done that revealed a large intraventricular hemorrhage worse on the right occipital horn than left and developing hydrocephalus.  There is also acute hemorrhage in the medial right temporal lobe that is inseparable from the IVH.  Small foci of residual/recurrent hemorrhage or mineralization in the left cerebellar hemisphere.  Last known well: 1450 hrs. Modified rankin score: 3-Moderate disability-requires help but walks WITHOUT assistance ICH Score: 3 tNKASE: Not offered due to ICH Thrombectomy: not offered due to ICH  NIHSS components Score: Comment  1a Level of Conscious 0[x]  1[]  2[]  3[]      1b LOC Questions 0[]  1[]  2[x]       1c LOC Commands 0[]  1[]  2[x]       2 Best Gaze 0[]  1[]  2[x]       3 Visual 0[x]  1[]  2[]  3[]      4 Facial Palsy 0[]  1[]  2[x]  3[]      5a Motor Arm - left 0[]  1[]  2[]  3[x]  4[]  UN[]    5b Motor Arm - Right 0[]  1[]  2[]  3[x]  4[]  UN[]    6a Motor Leg - Left 0[]  1[]  2[]  3[x]  4[]  UN[]    6b Motor Leg - Right 0[]  1[]  2[]  3[x]  4[]  UN[]    7 Limb Ataxia 0[x]  1[]  2[]  UN[]      8 Sensory 0[x]  1[]  2[]  UN[]       9 Best Language 0[]  1[]  2[]  3[x]      10 Dysarthria 0[]  1[]  2[x]  UN[]      11 Extinct. and Inattention 0[x]  1[]  2[]       TOTAL: 25      ROS  Comprehensive ROS performed and pertinent positives documented in the HPI.  Past History   Past Medical History:  Diagnosis Date   Anemia    Anxiety    DM (diabetes mellitus), type 1 (HCC)    ESRD on hemodialysis (HCC)    Gastroparesis    History of simultaneous kidney and pancreas transplant (HCC) 2014   Hypertension    Ischemic cerebrovascular accident (CVA) (HCC) 11/2021   Narcotic abuse (HCC)    Non compliance with medical treatment    Stroke [facility])    Vision loss    Past Surgical History:  Procedure Laterality Date   A/V FISTULAGRAM Right 02/17/2021   Procedure: A/V FISTULAGRAM;  Surgeon: Gretta Lonni PARAS, MD;  [facility]: [facility] INVASIVE CV LAB;  Service: Cardiovascular;  Laterality: Right;   AV FISTULA PLACEMENT  11/17/2011   Procedure: ARTERIOVENOUS (AV) FISTULA CREATION;  Surgeon: Krystal JULIANNA Doing, MD;  Location: [facility] OR;  Service: Vascular;  Laterality: Right;   BIOPSY  12/03/2021   Procedure: BIOPSY;  Surgeon: Legrand Victory LITTIE DOUGLAS, MD;  Location: [facility] ENDOSCOPY;  Service:  Gastroenterology;;   ESOPHAGOGASTRODUODENOSCOPY (EGD) WITH PROPOFOL  N/A 12/03/2021   Procedure: ESOPHAGOGASTRODUODENOSCOPY (EGD) WITH PROPOFOL ;  Surgeon: Legrand Victory LITTIE DOUGLAS, MD;  Location: [facility] ENDOSCOPY;  Service: Gastroenterology;  Laterality: N/A;   EYE SURGERY     HEMATOMA EVACUATION Right 02/25/2021   Procedure: EVACUATION HEMATOMA RIGHT CHEST;  Surgeon: Sheree Penne Bruckner, MD;  Location: [facility] OR;  Service: Vascular;  Laterality: Right;   INSERTION OF DIALYSIS CATHETER Right 12/08/2020   Procedure: INSERTION OF TUNNELED DIALYSIS CATHETER;  Surgeon: Gretta Bruckner PARAS, MD;  Location: [facility] OR;  Service: Vascular;  Laterality: Right;   IR ANGIO INTRA EXTRACRAN SEL INTERNAL CAROTID BILAT MOD SED  02/27/2024   IR ANGIO VERTEBRAL SEL VERTEBRAL UNI R MOD SED  02/27/2024   IR  GASTROSTOMY TUBE MOD SED  03/20/2024   IR GJ TUBE CHANGE  04/15/2024   IR REMOVAL TUN CV CATH W/O FL  05/03/2021   KIDNEY TRANSPLANT     NEPHRECTOMY TRANSPLANTED ORGAN     pancrease transplant     PERIPHERAL VASCULAR BALLOON ANGIOPLASTY Right 02/17/2021   Procedure: PERIPHERAL VASCULAR BALLOON ANGIOPLASTY;  Surgeon: Gretta Bruckner PARAS, MD;  [facility] INVAS[facility] CV LAB;  Service: Cardiovascular;  Laterality: Right;   REFRACTIVE SURGERY     REVISON OF ARTERIOVENOUS FISTULA Right 12/08/2020   Procedure: RIGHT ARM ARTERIOVENOUS FISTULA REVISION AND RESECTION;  Surgeon: Gretta Bruckner PARAS, MD;  Location: [facility] OR;  Service: Vascular;  Laterality: Right;   REVISON OF ARTERIOVENOUS FISTULA Right 02/25/2021   Procedure: RIGHT ARM ARTERIOVENOUS FISTULA REVISION WITH TRANSPOSITION OF CEPHALIC VEIN ON AXILLARY VEIN;  Surgeon: Gretta Bruckner PARAS, MD;  Location: [facility] OR;  Service: Vascular;  Laterality: Right;   Family History  Problem Relation Age of Onset   Hypertension Father    Social History   Socioeconomic History   Marital status: Married    Spouse name: Not on file   Number of children: Not on file   Years of education: Not on file   Highest education level: Not on file  Occupational History   Not on file  Tobacco Use   Smoking status: Former    Types: Pipe   Smokeless tobacco: Never   Tobacco comments:    Hooka  Vaping Use   Vaping status: Never Used  Substance and Sexual Activity   Alcohol  use: No   Drug use: No   Sexual activity: Not Currently  Other Topics Concern   Not on file  Social History Narrative   Worked for [place]  [employer]--as did husband. Had to leave [place] for safety reasons. Has been in [place]  since 9/09.   Social Drivers of Corporate investment banker Strain: Not on file  Food Insecurity: Patient Unable To Answer (02/15/2024)   Hunger Vital Sign    Worried About Running Out of Food in the Last Year: Patient unable to answer    Ran Out of Food in the Last Year: Patient  unable to answer  Transportation Needs: No Transportation Needs (04/16/2024)   Received from [facility]   [facility] SDOH Transportation Source    Has lack of transportation kept you from medical appointments or from getting medications?: No    Has lack of transportation kept you from meetings, work, or from getting things needed for daily living?: No  Physical Activity: Not on file  Stress: No Stress Concern Present (04/16/2024)   Received from [facility]   [employer] of Occupational Health - Occupational Stress Questionnaire    Feeling of Stress :  Only a little  Social Connections: Unknown (10/04/2022)   Received from [facility]   Social Network    Social Network: Not on file   No Known Allergies  Medications  (Not in a hospital admission)    Vitals   Vitals:   05/19/24 1600  Weight: 54.6 kg     Body mass index is 18.57 kg/m.  Physical Exam  General: Somewhat cachectic looking HEENT: Normocephalic atraumatic Chest: Clear Cardiovascular: Regular rhythm Neurological exam She appears awake, gazing to the left, unresponsive to voice Does not blink to threat from either side Pupils appear equal round reactive light Appears to have left gaze preference Face appears somewhat asymmetric with left lower facial weakness Does not spontaneously move any extremities To noxious stimulation, has minimal grimace in both upper extremities. Does not grimace to noxious stimulation in lower extremities   Labs   CBC:  Recent Labs  Lab 05/19/24 1615  HGB 12.9  HCT 38.0    Basic Metabolic Panel:  Lab Results  Component Value Date   NA 139 05/19/2024   K 3.0 (L) 05/19/2024   CO2 25 04/16/2024   GLUCOSE 154 (H) 05/19/2024   BUN 9 05/19/2024   CREATININE 2.90 (H) 05/19/2024   CALCIUM  10.6 (H) 04/16/2024   GFRNONAA 12 (L) 04/16/2024   GFRAA  02/02/2022    PATIENT IDENTIFICATION ERROR. PLEASE DISREGARD RESULTS. ACCOUNT WILL BE CREDITED.   Lipid Panel:  Lab  Results  Component Value Date   LDLCALC 63 02/15/2024   HgbA1c:  Lab Results  Component Value Date   HGBA1C 4.2 (L) 02/14/2024   CT Head without contrast(Personally reviewed): Large volume acute IVH with moderate hydrocephalus, acute hemorrhage in the medial right temporal lobe inseparable from the IVH.  Small foci of residual/recurrent hemorrhage and/or mineralization in the left cerebral hemisphere.  Impression   [name] [name] is a 43 y.o. female past history of hypertension, ESRD on HD status post renal and pancreatic transplants, diabetes, gastroparesis, multiple strokes both ischemic and hemorrhagic along with history of prepontine subarachnoid hemorrhage which was angio negative during last admission, also had hydrocephalus requiring EVD placement which has since been discontinued and was discharged to nursing/LTAC, and has now presented back with leftward gaze and altered mental status of sudden onset while at dialysis. Blood pressure noted in systolic 200s CT head with a large IVH and right temporal ICH. Difficult to obtain access Given hydralazine  IM, eventually access obtained and started on Cleviprex . Also given Ativan  1 mg x 1 and Keppra  load. Home dose antiepileptic-Vimpat  100 twice daily.  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 Currently on Cleviprex  drip after receiving as needed hydralazine  Received Ativan  1 mg IV x 1.  Loading with Keppra  2 g IV x 1. Increase home Vimpat  to 150 mg twice daily. Routine EEG No antiplatelets or anticoagulants Management of ESRD, diabetes and medical comorbidities per critical care. Stat neurosurgical consultation for EVD placement for the hydrocephalus-Dr. Joshua notified. Stroke neurology will  continue to follow with you Plan discussed with Dr. Patsey ______________________________________________________________________   Signed,  Eligio Lav, MD [name]   CRITICAL CARE ATTESTATION Performed by: Eligio Lav, MD Total critical care time: 51 minutes Critical care time was exclusive of separately billable procedures and treating other patients and/or supervising APPs/Residents/Students Critical care was necessary to treat or  prevent imminent or life-threatening deterioration. This patient is critically ill and at significant risk for neurological worsening and/or death and care requires constant monitoring. Critical care was time spent personally by me on the following activities: development of treatment plan with patient and/or surrogate as well as nursing, discussions with consultants, evaluation of patient's response to treatment, examination of patient, obtaining history from patient or surrogate, ordering and performing treatments and interventions, ordering and review of laboratory studies, ordering and review of radiographic studies, pulse oximetry, re-evaluation of patient's condition, participation in multidisciplinary rounds and medical decision making of high complexity in the care of this patient.

## 2024-05-20 ENCOUNTER — Inpatient Hospital Stay (HOSPITAL_COMMUNITY)

## 2024-05-20 DIAGNOSIS — Z515 Encounter for palliative care: Secondary | ICD-10-CM

## 2024-05-20 DIAGNOSIS — R569 Unspecified convulsions: Secondary | ICD-10-CM

## 2024-05-20 DIAGNOSIS — N186 End stage renal disease: Secondary | ICD-10-CM | POA: Diagnosis not present

## 2024-05-20 DIAGNOSIS — E1143 Type 2 diabetes mellitus with diabetic autonomic (poly)neuropathy: Secondary | ICD-10-CM | POA: Diagnosis not present

## 2024-05-20 DIAGNOSIS — I615 Nontraumatic intracerebral hemorrhage, intraventricular: Secondary | ICD-10-CM | POA: Diagnosis not present

## 2024-05-20 DIAGNOSIS — E876 Hypokalemia: Secondary | ICD-10-CM

## 2024-05-20 DIAGNOSIS — Z7189 Other specified counseling: Secondary | ICD-10-CM

## 2024-05-20 DIAGNOSIS — I619 Nontraumatic intracerebral hemorrhage, unspecified: Secondary | ICD-10-CM | POA: Diagnosis not present

## 2024-05-20 DIAGNOSIS — I611 Nontraumatic intracerebral hemorrhage in hemisphere, cortical: Secondary | ICD-10-CM | POA: Diagnosis not present

## 2024-05-20 DIAGNOSIS — J9601 Acute respiratory failure with hypoxia: Secondary | ICD-10-CM

## 2024-05-20 DIAGNOSIS — I610 Nontraumatic intracerebral hemorrhage in hemisphere, subcortical: Secondary | ICD-10-CM

## 2024-05-20 DIAGNOSIS — I161 Hypertensive emergency: Secondary | ICD-10-CM | POA: Diagnosis not present

## 2024-05-20 DIAGNOSIS — R29724 NIHSS score 24: Secondary | ICD-10-CM

## 2024-05-20 DIAGNOSIS — I1A0 Resistant hypertension: Secondary | ICD-10-CM

## 2024-05-20 DIAGNOSIS — I120 Hypertensive chronic kidney disease with stage 5 chronic kidney disease or end stage renal disease: Secondary | ICD-10-CM | POA: Diagnosis not present

## 2024-05-20 DIAGNOSIS — Z9911 Dependence on respirator [ventilator] status: Secondary | ICD-10-CM

## 2024-05-20 LAB — PROTIME-INR
INR: 1.1 (ref 0.8–1.2)
Prothrombin Time: 15.3 s — ABNORMAL HIGH (ref 11.4–15.2)

## 2024-05-20 LAB — CBC
HCT: 30.5 % — ABNORMAL LOW (ref 36.0–46.0)
Hemoglobin: 9.8 g/dL — ABNORMAL LOW (ref 12.0–15.0)
MCH: 31.1 pg (ref 26.0–34.0)
MCHC: 32.1 g/dL (ref 30.0–36.0)
MCV: 96.8 fL (ref 80.0–100.0)
Platelets: 119 K/uL — ABNORMAL LOW (ref 150–400)
RBC: 3.15 MIL/uL — ABNORMAL LOW (ref 3.87–5.11)
RDW: 14.8 % (ref 11.5–15.5)
WBC: 5.1 K/uL (ref 4.0–10.5)
nRBC: 0 % (ref 0.0–0.2)

## 2024-05-20 LAB — BASIC METABOLIC PANEL WITH GFR
Anion gap: 12 (ref 5–15)
BUN: 14 mg/dL (ref 6–20)
CO2: 31 mmol/L (ref 22–32)
Calcium: 9.7 mg/dL (ref 8.9–10.3)
Chloride: 95 mmol/L — ABNORMAL LOW (ref 98–111)
Creatinine, Ser: 3.55 mg/dL — ABNORMAL HIGH (ref 0.44–1.00)
GFR, Estimated: 16 mL/min — ABNORMAL LOW (ref >60)
Glucose, Bld: 126 mg/dL — ABNORMAL HIGH (ref 70–99)
Potassium: 2.7 mmol/L — CL (ref 3.5–5.1)
Sodium: 138 mmol/L (ref 135–145)

## 2024-05-20 LAB — LIPID PANEL
Cholesterol: 117 mg/dL (ref 0–200)
HDL: 47 mg/dL (ref >40)
LDL Cholesterol: 38 mg/dL (ref 0–99)
Total CHOL/HDL Ratio: 2.5 ratio
Triglycerides: 158 mg/dL — ABNORMAL HIGH (ref <150)
VLDL: 32 mg/dL (ref 0–40)

## 2024-05-20 LAB — POCT I-STAT 7, (LYTES, BLD GAS, ICA,H+H)
Acid-Base Excess: 8 mmol/L — ABNORMAL HIGH (ref 0.0–2.0)
Bicarbonate: 32.9 mmol/L — ABNORMAL HIGH (ref 20.0–28.0)
Calcium, Ion: 1.23 mmol/L (ref 1.15–1.40)
HCT: 27 % — ABNORMAL LOW (ref 36.0–46.0)
Hemoglobin: 9.2 g/dL — ABNORMAL LOW (ref 12.0–15.0)
O2 Saturation: 100 %
Patient temperature: 99
Potassium: 3.6 mmol/L (ref 3.5–5.1)
Sodium: 138 mmol/L (ref 135–145)
TCO2: 34 mmol/L — ABNORMAL HIGH (ref 22–32)
pCO2 arterial: 45.3 mmHg (ref 32–48)
pH, Arterial: 7.47 — ABNORMAL HIGH (ref 7.35–7.45)
pO2, Arterial: 196 mmHg — ABNORMAL HIGH (ref 83–108)

## 2024-05-20 LAB — HEPATITIS B SURFACE ANTIGEN: Hepatitis B Surface Ag: NONREACTIVE

## 2024-05-20 LAB — GLUCOSE, CAPILLARY
Glucose-Capillary: 127 mg/dL — ABNORMAL HIGH (ref 70–99)
Glucose-Capillary: 107 mg/dL — ABNORMAL HIGH (ref 70–99)
Glucose-Capillary: 135 mg/dL — ABNORMAL HIGH (ref 70–99)
Glucose-Capillary: 126 mg/dL — ABNORMAL HIGH (ref 70–99)
Glucose-Capillary: 144 mg/dL — ABNORMAL HIGH (ref 70–99)
Glucose-Capillary: 129 mg/dL — ABNORMAL HIGH (ref 70–99)

## 2024-05-20 LAB — HEMOGLOBIN A1C
Hgb A1c MFr Bld: 3.6 % — ABNORMAL LOW (ref 4.8–5.6)
Mean Plasma Glucose: 56.62 mg/dL

## 2024-05-20 LAB — PHOSPHORUS: Phosphorus: 2.4 mg/dL — ABNORMAL LOW (ref 2.5–4.6)

## 2024-05-20 LAB — HCG, SERUM, QUALITATIVE: Preg, Serum: NEGATIVE

## 2024-05-20 LAB — MAGNESIUM: Magnesium: 2.4 mg/dL (ref 1.7–2.4)

## 2024-05-20 MED ORDER — POTASSIUM CHLORIDE 20 MEQ PO PACK
40.0000 meq | PACK | ORAL | Status: DC
Start: 2024-05-20 — End: 2024-05-20
  Administered 2024-05-20: 40 meq
  Filled 2024-05-20: qty 2

## 2024-05-20 MED ORDER — CHLORHEXIDINE GLUCONATE CLOTH 2 % EX PADS: 6.0000 | MEDICATED_PAD | Freq: Every day | CUTANEOUS | Status: DC

## 2024-05-20 MED ORDER — LABETALOL HCL 5 MG/ML IV SOLN
10.0000 mg | INTRAVENOUS | Status: DC | PRN
  Administered 2024-05-20 – 2024-05-21 (×4): 10 mg via INTRAVENOUS
  Filled 2024-05-20 (×4): qty 4

## 2024-05-20 MED ORDER — ACETAMINOPHEN 650 MG RE SUPP: 650.0000 mg | RECTAL | Status: DC | PRN

## 2024-05-20 MED ORDER — HYDRALAZINE HCL 20 MG/ML IJ SOLN
10.0000 mg | INTRAMUSCULAR | Status: DC | PRN
  Administered 2024-05-20 – 2024-05-21 (×3): 10 mg via INTRAVENOUS
  Filled 2024-05-20 (×4): qty 1

## 2024-05-20 MED ORDER — ACETAMINOPHEN 160 MG/5ML PO SOLN
650.0000 mg | ORAL | Status: DC | PRN
  Administered 2024-05-21 – 2024-08-02 (×36): 650 mg
  Filled 2024-05-20 (×40): qty 20.3

## 2024-05-20 MED ORDER — AMLODIPINE BESYLATE 10 MG PO TABS
10.0000 mg | ORAL_TABLET | Freq: Every day | ORAL | Status: DC
  Administered 2024-05-20 – 2024-05-26 (×7): 10 mg
  Filled 2024-05-20 (×8): qty 1

## 2024-05-20 MED ORDER — MYCOPHENOLATE 200 MG/ML ORAL SUSPENSION
500.0000 mg | Freq: Two times a day (BID) | ORAL | Status: DC
  Administered 2024-05-20 – 2024-08-08 (×159): 500 mg
  Filled 2024-05-20 (×165): qty 2.5

## 2024-05-20 MED ORDER — POTASSIUM PHOSPHATES 15 MMOLE/5ML IV SOLN
30.0000 mmol | Freq: Once | INTRAVENOUS | Status: AC
  Administered 2024-05-20: 30 mmol via INTRAVENOUS
  Filled 2024-05-20: qty 10

## 2024-05-20 MED ORDER — SODIUM CHLORIDE 0.9 % IV SOLN
100.0000 mg | Freq: Two times a day (BID) | INTRAVENOUS | Status: DC
  Administered 2024-05-20 – 2024-05-26 (×14): 100 mg via INTRAVENOUS
  Filled 2024-05-20 (×16): qty 10

## 2024-05-20 MED ORDER — PREDNISONE 5 MG/5ML PO SOLN
15.0000 mg | Freq: Every day | ORAL | Status: DC
  Administered 2024-05-21 – 2024-06-05 (×16): 15 mg
  Filled 2024-05-20 (×17): qty 15

## 2024-05-20 MED ORDER — ACETAMINOPHEN 325 MG PO TABS
650.0000 mg | ORAL_TABLET | ORAL | Status: DC | PRN
  Administered 2024-05-20 – 2024-08-06 (×29): 650 mg
  Filled 2024-05-20 (×27): qty 2

## 2024-05-20 MED ORDER — POTASSIUM CHLORIDE 20 MEQ PO PACK
40.0000 meq | PACK | ORAL | Status: AC
  Administered 2024-05-20: 40 meq
  Filled 2024-05-20: qty 2

## 2024-05-20 MED ORDER — LACOSAMIDE 50 MG PO TABS: 100.0000 mg | ORAL_TABLET | Freq: Two times a day (BID) | ORAL | Status: DC

## 2024-05-20 MED ORDER — CLONIDINE HCL 0.2 MG PO TABS
0.2000 mg | ORAL_TABLET | Freq: Three times a day (TID) | ORAL | Status: DC
  Administered 2024-05-20 (×3): 0.2 mg
  Filled 2024-05-20 (×3): qty 1

## 2024-05-20 NOTE — Progress Notes (Signed)
 EEG complete. Results pending.  ?

## 2024-05-20 NOTE — Plan of Care (Signed)
 Palliative-  Consult received, chart reviewed.   Attempted to see patient but she was off unit for procedure.   Cassondra Stain, AGNP-C Palliative Medicine  No charge

## 2024-05-20 NOTE — Consult Note (Signed)
 Renal Service Consult Note [facility] [name] JONETTA Fret, MD  Patient: [name] [name] Date: 05/20/2024 Requesting Physician: Dr. Harold  Reason for Consult: ESRD patient with ICH HPI: The patient is a 43 y.o. year-old w/ PMH as below who presented to ED sent from dialysis center for code stroke.  Just under 3 hours into dialysis patient developed left-sided deficits with incoherent speech, not following commands.  Per EMS blood pressure was in the 200s.  Patient had previously been admitted here in June and July for ICH.  In the ED CT head showed large volume IVH with moderate hydrocephalus and acute hemorrhage of the medial right temporal lobe.  Small foci of residual hemorrhage in the left cerebellar hemisphere.  Patient was intubated and admitted to ICU.  We are asked to see for dialysis.   Pt seen and ICU room.  Procedure is in progress, no history obtained   ROS - n/a   Past Medical History  Past Medical History:  Diagnosis Date   Anemia    Anxiety    DM (diabetes mellitus), type 1 (HCC)    ESRD on hemodialysis (HCC)    Gastroparesis    History of simultaneous kidney and pancreas transplant (HCC) 2014   Hypertension    Ischemic cerebrovascular accident (CVA) (HCC) 11/2021   Narcotic abuse (HCC)    Non compliance with medical treatment    Stroke [facility])    Vision loss    Past Surgical History  Past Surgical History:  Procedure Laterality Date   A/V FISTULAGRAM Right 02/17/2021   Procedure: A/V FISTULAGRAM;  Surgeon: Gretta Lonni PARAS, MD;  [facility] INVASIVE CV LAB;  Service: Cardiovascular;  Laterality: Right;   AV FISTULA PLACEMENT  11/17/2011   Procedure: ARTERIOVENOUS (AV) FISTULA CREATION;  Surgeon: Krystal JULIANNA Doing, MD;  Location: [facility] OR;  Service: Vascular;  Laterality: Right;   BIOPSY  12/03/2021   Procedure: BIOPSY;  Surgeon: Legrand Victory LITTIE DOUGLAS, MD;  Location: [facility] ENDOSCOPY;  Service: Gastroenterology;;   ESOPHAGOGASTRODUODENOSCOPY (EGD) WITH  PROPOFOL  N/A 12/03/2021   Procedure: ESOPHAGOGASTRODUODENOSCOPY (EGD) WITH PROPOFOL ;  Surgeon: Legrand Victory LITTIE DOUGLAS, MD;  Location: [facility] ENDOSCOPY;  Service: Gastroenterology;  Laterality: N/A;   EYE SURGERY     HEMATOMA EVACUATION Right 02/25/2021   Procedure: EVACUATION HEMATOMA RIGHT CHEST;  Surgeon: Sheree Penne Lonni, MD;  Location: [facility] OR;  Service: Vascular;  Laterality: Right;   INSERTION OF DIALYSIS CATHETER Right 12/08/2020   Procedure: INSERTION OF TUNNELED DIALYSIS CATHETER;  Surgeon: Gretta Lonni PARAS, MD;  Location: [facility] OR;  Service: Vascular;  Laterality: Right;   IR ANGIO INTRA EXTRACRAN SEL INTERNAL CAROTID BILAT MOD SED  02/27/2024   IR ANGIO VERTEBRAL SEL VERTEBRAL UNI R MOD SED  02/27/2024   IR GASTROSTOMY TUBE MOD SED  03/20/2024   IR GJ TUBE CHANGE  04/15/2024   IR REMOVAL TUN CV CATH W/O FL  05/03/2021   KIDNEY TRANSPLANT     NEPHRECTOMY TRANSPLANTED ORGAN     pancrease transplant     PERIPHERAL VASCULAR BALLOON ANGIOPLASTY Right 02/17/2021   Procedure: PERIPHERAL VASCULAR BALLOON ANGIOPLASTY;  Surgeon: Gretta Lonni PARAS, MD;  [facility]: [facility] INVASIVE CV LAB;  Service: Cardiovascular;  Laterality: Right;   REFRACTIVE SURGERY     REVISON OF ARTERIOVENOUS FISTULA Right 12/08/2020   Procedure: RIGHT ARM ARTERIOVENOUS FISTULA REVISION AND RESECTION;  Surgeon: Gretta Lonni PARAS, MD;  Location[facility] OR;  Service: Vascular;  Laterality: Right;   REVISON OF ARTERIOVENOUS FISTULA Right 02/25/2021  Procedure: RIGHT ARM ARTERIOVENOUS FISTULA REVISION WITH TRANSPOSITION OF CEPHALIC VEIN ON AXILLARY VEIN;  Surgeon: Gretta Lonni PARAS, MD;  Location: [facility] OR;  Service: Vascular;  Laterality: Right;   Family History  Family History  Problem Relation Age of Onset   Hypertension Father    Social History  reports that she has quit smoking. Her smoking use included pipe. She has never used smokeless tobacco. She reports that she does not drink alcohol  and does not use drugs. Allergies No  Known Allergies Home medications Prior to Admission medications   Medication Sig Start Date End Date Taking? Authorizing Provider  albuterol  (PROVENTIL ) (2.5 MG/3ML) 0.083% nebulizer solution Take 3 mLs (2.5 mg total) by nebulization every 4 (four) hours as needed for wheezing or shortness of breath. 03/31/24   Christobal Guadalajara, MD  amLODipine  (NORVASC ) 10 MG tablet Take 1 tablet (10 mg total) by mouth daily. 02/05/22 11/06/23  Arlice Reichert, MD  artificial tears ophthalmic solution Place 1 drop into the left eye as needed for dry eyes. 03/31/24   Christobal Guadalajara, MD  aspirin  EC 81 MG tablet Chew 81 mg by mouth daily.    [provider]  busPIRone  (BUSPAR ) 10 MG tablet Take 10 mg by mouth 2 (two) times daily. 10/25/23   [provider]  cinacalcet  (SENSIPAR ) 30 MG tablet Take 3 tablets (90 mg total) by mouth daily with supper. 06/14/23   Amin, Sumayya, MD  cloNIDine  (CATAPRES  - DOSED IN MG/24 HR) 0.3 mg/24hr patch Place 1 patch (0.3 mg total) onto the skin once a week. 04/04/24   Christobal Guadalajara, MD  cycloSPORINE  (SANDIMMUNE ) 25 MG capsule Take 25 mg by mouth in the morning.    [provider]  cycloSPORINE  modified (NEORAL ) 100 MG capsule Take 2 capsules (200 mg total) by mouth 2 (two) times daily. 12/25/21   Pokhrel, Laxman, MD  Darbepoetin Alfa  (ARANESP ) 60 MCG/0.3ML SOSY injection Inject 0.3 mLs (60 mcg total) into the vein every Friday with hemodialysis. 11/18/21   Danton Reyes DASEN, MD  doxercalciferol  (HECTOROL ) 4 MCG/2ML injection Inject 3 mLs (6 mcg total) into the vein every Monday, Wednesday, and Friday with hemodialysis. 11/17/21   Danton Reyes DASEN, MD  DULoxetine  (CYMBALTA ) 20 MG capsule Take 1 capsule (20 mg total) by mouth daily. 02/06/22 11/06/23  Arlice Reichert, MD  ferric citrate  (AURYXIA ) 1 GM 210 MG(Fe) tablet Take 210-630 mg by mouth See admin instructions. Take 3 tablets (630 mg) by mouth three times daily with meals, and take 1 tablet (210 mg) twice daily with snacks    [provider]  gabapentin  (NEURONTIN ) 100 MG capsule Take 100 mg by mouth 2 (two) times daily. 10/25/23   [provider]  hydrALAZINE  (APRESOLINE ) 25 MG tablet Take 3 tablets (75 mg total) by mouth every 8 (eight) hours. 02/05/22 11/06/23  Arlice Reichert, MD  hydrOXYzine  (VISTARIL ) 25 MG capsule Take 25 mg by mouth 3 (three) times daily as needed for anxiety. 05/26/23   [provider]  lacosamide  100 MG TABS Take 1 tablet (100 mg total) by mouth 2 (two) times daily. 03/31/24   Christobal Guadalajara, MD  lidocaine -prilocaine  (EMLA ) cream Apply 1 application  topically daily as needed (prior to port access). 07/09/20   [provider]  MELATONIN PO Take 1 tablet by mouth at bedtime.    [provider]  metoCLOPramide  (REGLAN ) 5 MG tablet Take 5 mg by mouth 2 (two) times daily before a meal.    [provider]  metoprolol   tartrate (LOPRESSOR ) 50 MG tablet Take 1 tablet (50 mg total) by mouth 2 (two) times daily. 03/31/24   Christobal Guadalajara, MD  multivitamin (RENA-VIT) TABS tablet Take 1 tablet by mouth at bedtime. 03/31/24   Christobal Guadalajara, MD  mycophenolate  (CELLCEPT ) 200 mg/mL oral suspension Take 2.5 mLs (500 mg total) by mouth 2 (two) times daily. 03/31/24   Christobal Guadalajara, MD  Nutritional Supplements (FEEDING SUPPLEMENT, NEPRO CARB STEADY,) LIQD Place 1,000 mLs into feeding tube continuous. 03/31/24   Christobal Guadalajara, MD  Nystatin (GERHARDT'S BUTT CREAM) CREA Apply 1 Application topically as needed for irritation. 03/31/24   Christobal Guadalajara, MD  ondansetron  (ZOFRAN ) 4 MG tablet Take 4 mg by mouth every 8 (eight) hours as needed for nausea or vomiting.    [provider]  oxyCODONE -acetaminophen  (PERCOCET/ROXICET) 5-325 MG tablet Take 1 tablet by mouth 3 (three) times daily.    [provider]  predniSONE  (DELTASONE ) 5 MG tablet Take 15 mg by mouth daily. 06/24/15   [provider]  sevelamer  carbonate (RENVELA ) 2.4 g PACK Take 2.4 g by mouth 3 (three) times daily with  meals. 03/31/24   Christobal Guadalajara, MD  simethicone  (MYLICON) 40 MG/0.6ML drops Take 0.6 mLs (40 mg total) by mouth 4 (four) times daily as needed for flatulence. 03/31/24   Christobal Guadalajara, MD  XPHOZAH 30 MG TABS Take 1 tablet by mouth 2 (two) times daily. 06/21/23   [provider]  zolpidem  (AMBIEN ) 10 MG tablet Take 5 mg by mouth at bedtime as needed for sleep.    [provider]     Vitals:   05/20/24 1345 05/20/24 1400 05/20/24 1415 05/20/24 1430  BP:  (!) 110/59    Pulse: 97 88 91 89  Resp: 19 14 15 15   Temp:      TempSrc:      SpO2: 100% 100% 100% 100%  Weight:       Exam Gen on vent, sedated Sclera anicteric, throat w/ ETT No jvd or bruits Chest clear anterior/ lateral RRR no MRG Abd soft ntnd no mass or ascites +bs GU defer Ext no LE or UE edema Neuro is on vent, sedated   RUE AVF+bruit   Home bp meds: Norvasc  10 every day Catapres  patch 0.3mg  weekly Hydralazine  75mg  tid Metoprolol  50mg  bid   OP HD: [facility] MWF 3h  B400  55.2kg  2K bath   AVF  Heparin  none Last OP HD 9/15, about 2h 50 min then LOC Mircera 150 mcg q 2 wks, last 9/10, due 9/24    Assessment/ Plan: Acute R IC bleed: d/t uncont HTN. On vent, per neuro/ CCM.  ESRD: on HD MWF. Had 3 hrs HD yest. Next HD Wed.  HTN: BP's under control, was started on IV clevidipine  in ED Volume: several kg under dry wt, coming off under dry wt recently. Lower edw here.  Anemia of esrd: Hb 10-12 here, follow.  VDRF: per CCM DM type 1       Rob Lourdes Manning  MD [facility] 05/20/2024, 3:03 PM  Recent Labs  Lab 05/19/24 1610 05/19/24 1615 05/19/24 2058 05/20/24 0456 05/20/24 1028  HGB 12.2 12.9   < > 9.8* 9.2*  ALBUMIN 3.6  --   --   --   --   CALCIUM  9.3  --   --  9.7  --   PHOS  --   --   --  2.4*  --   CREATININE 2.80* 2.90*  --  3.55*  --  K 3.1* 3.0*   < > 2.7* 3.6   < > = values in this interval not displayed.   Inpatient medications:   stroke: early stages of recovery book   Does not apply Once    amLODipine   10 mg Per Tube Daily   Chlorhexidine  Gluconate Cloth  6 each Topical 1 day or 1 dose   cloNIDine   0.2 mg Per Tube TID   insulin  aspart  0-9 Units Subcutaneous Q4H   mycophenolate   500 mg Per Tube BID   mouth rinse  15 mL Mouth Rinse Q2H   pantoprazole  (PROTONIX ) IV  40 mg Intravenous QHS   [START ON 05/21/2024] predniSONE   15 mg Per Tube Q breakfast   sodium chloride  flush  3 mL Intravenous Once    sodium chloride      clevidipine  14 mg/hr (05/20/24 1400)   dexmedetomidine  (PRECEDEX ) IV infusion 0.3 mcg/kg/hr (05/20/24 1400)   lacosamide  (VIMPAT ) IV     Place/Maintain arterial line **AND** sodium chloride , acetaminophen  **OR** acetaminophen  (TYLENOL ) oral liquid 160 mg/5 mL **OR** acetaminophen , docusate sodium , hydrALAZINE , labetalol , mouth rinse, polyethylene glycol

## 2024-05-20 NOTE — Plan of Care (Signed)
  Problem: Education: Goal: Ability to describe self-care measures that may prevent or decrease complications (Diabetes Survival Skills Education) will improve Outcome: Progressing   Problem: Fluid Volume: Goal: Ability to maintain a balanced intake and output will improve Outcome: Progressing   Problem: Skin Integrity: Goal: Risk for impaired skin integrity will decrease Outcome: Progressing

## 2024-05-20 NOTE — Progress Notes (Signed)
Dear Doctor: This patient has been identified as a candidate for PICC /CVCfor the following reason (s): IV therapy over 48 hours, drug pH or osmolality (causing phlebitis, infiltration in 24 hours), drug extravasation potential with tissue necrosis (KCL, Dilantin, Dopamine, CaCl, MgSO4, chemo vesicant), poor veins/poor circulatory system (CHF, COPD, emphysema, diabetes, steroid use, IV drug abuse, etc.), and incompatible drugs (aminophyllin, TPN, heparin, given with an antibiotic) If you agree, please write an order for the indicated device.   Thank you for supporting the early vascular access assessment program. 

## 2024-05-20 NOTE — Consult Note (Signed)
 Consultation Note Date: 05/20/2024   Patient Name: [name] [name]  DOB: 03/17/1981  MRN: [id number]  Age / Sex: 43 y.o., female  [facility]P: [name] Referring Physician: Harold Scholz, MD  Reason for Consultation:  Patient with recurrent intraparenchymal hemorrhage, end-stage renal disease on hemodialysis  HPI/Patient Profile: 43 y.o. female  with past medical history of ESRD on HD, failed renal transplant, pancreatic transplant, DM1, gastroparesis recent admission 6/11-7/28 for ICH with d/c to [facility] and eventual d/c to [facility] on 8/13, admitted on 05/19/2024 from dialysis with code stroke. Workup revealed recurrent large intraparenchymal hemorrhage. EVD placed by neurosurgery. Palliative consulted for medical decision making.   Primary Decision Maker HCPOA- document on chart designates Nakema Bar as HCPOA  Discussion: Chart reviewed including labs, progress notes, imaging from this and previous encounters.  MRI reviewed showing large IVH- R>L. [place] is intubated and sedated. EEG pending.  Per discharge summary from LTAC patient was beginning to take steps with mod to max assist after prolonged hospitalization.  Per neuro note- NIHSS total 25 on admission, having episodes of apnea on wean. She was seen several times by PMT during previous hospitalization. At that time HCPOA document was completed and patient had expressed preference for full scope care and full code. 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 Family resides in [place]- however, chart notes indicate patient's mother has been present at bedside.  I spoke with her HCPOA Nakema by phone. Nakema was not aware of patient's admission.  I gave her medical update and reviewed scans and current medical interventions with Nakema.  Nakema was under the impression that patient's mother had returned to [place].  Nori has been sick recently.  I discussed need for GOC discussion regarding patient's severe bleed.  Nakema agrees to meet in person tomorrow-she will contact patient's mother for her to also be in attendance.    SUMMARY OF RECOMMENDATIONS -Large intraparenchymal hemorrhage- drain in place, intubated, previous admission patient expressed desire for all life prolonging interventions- will need to readress GOC with her HCPOA- [employer]- recommend providers communicate with Nakema -Plan to meet with HCPOA tomorrow at 2pm    Code Status/Advance Care Planning:   Code Status: Full Code    Prognosis:   Unable to determine  Discharge Planning: To Be Determined  Primary Diagnoses: Present on Admission:  ICH (intracerebral hemorrhage) (HCC)   Review of Systems  Unable to perform ROS   Physical Exam Vitals and nursing note reviewed.  Constitutional:      Appearance: She is ill-appearing.  Pulmonary:     Comments: intubated Neurological:     Comments: sedated     Vital Signs: BP (!) 110/59   Pulse 89   Temp 97.6 F (36.4 C) (Axillary)   Resp 15   Wt 50.4 kg   SpO2 100%   BMI 17.15 kg/m  Pain Scale: CPOT       SpO2: SpO2: 100 % O2 Device:SpO2: 100 % O2 Flow Rate: .   IO: Intake/output  summary:  Intake/Output Summary (Last 24 hours) at 05/20/2024 1530 Last data filed at 05/20/2024 1400 Gross per 24 hour  Intake 875.57 ml  Output 137 ml  Net 738.57 ml    LBM: Last BM Date : 05/19/24 Baseline Weight: Weight: 54.6 kg Most recent weight: Weight: 50.4 kg       Thank you for this consult. Palliative medicine will continue to follow and assist as needed.   Signed by: Cassondra Stain, AGNP-C Palliative Medicine  Time includes:   Preparing to see the patient (e.g., review of tests) Obtaining and/or reviewing separately obtained history Performing a medically necessary appropriate examination and/or evaluation Counseling and educating the  patient/family/caregiver Ordering medications, tests, or procedures Referring and communicating with other health care professionals (when not reported separately) Documenting clinical information in the electronic or other health record Independently interpreting results (not reported separately) and communicating results to the patient/family/caregiver Care coordination (not reported separately) Clinical documentation   Please contact Palliative Medicine Team phone at [phone] for questions and concerns.  For individual provider: See Tracey

## 2024-05-20 NOTE — Progress Notes (Signed)
RT transported patient from 4N20 to MRI and back with RN. No complications. RT will continue to monitor.  ?

## 2024-05-20 NOTE — Procedures (Signed)
 Patient Name: [name] [name]  MRN: [id number]  Epilepsy Attending: Arlin MALVA Krebs  Referring Physician/Provider: Remi Pippin, NP  Date: 05/20/2024 Duration: 29.32 mins  Patient history: 43yo F with right temporal ICH. EEG to evaluate for seizure  Level of alertness: comatose/ lethargic   AEDs during EEG study: LCM, Ativan   Technical aspects: This EEG study was done with scalp electrodes positioned according to the 10-20 International system of electrode placement. Electrical activity was reviewed with band pass filter of 1-70Hz , sensitivity of 7 uV/mm, display speed of 2mm/sec with a 60Hz  notched filter applied as appropriate. EEG data were recorded continuously and digitally stored.  Video monitoring was available and reviewed as appropriate.  Description: EEG showed continuous generalized and lateralized right hemisphere 3 to 5 Hz theta-delta slowing admixed with 13-15hz  beta activity. Generalized periodic discharges with triphasic morphology were also noted at 1 Hz.  Hyperventilation and photic stimulation were not performed.     ABNORMALITY - Periodic discharges with triphasic morphology, generalized ( GPDs) - Continuous slow, generalized and lateralized right hemisphere  IMPRESSION: This study showed generalized periodic discharges with triphasic morphology. This EEG pattern can be seen due to toxic-metabolic causes. However, due to patient's complicated history, can also be on the ictal-interictal continuum but with low suspicion for seizure recurrence.  Additionally there is cortical dysfunction arising from right hemisphere likely secondary to underlying structural abnormality. Additionally there is moderate to severe diffuse encephalopathy. No seizures were seen throughout the recording.  Milina Pagett O Ajna Moors

## 2024-05-20 NOTE — Progress Notes (Signed)
 Initial Nutrition Assessment  DOCUMENTATION CODES:   Underweight, Non-severe (moderate) malnutrition in context of chronic illness  INTERVENTION:  Initiate tube feeding via J tube when tube exchanged by IR:  Osmolite 1.5 at 20 and increase by 10 ml/hr as tolerated until goal of 50 ml/h (1200 ml per day) Prosource TF20 60 ml DAILY  Provides 1880 kcal, 95 gm protein, 914 ml free water  daily  Liquid MVI with minerals daily 100 mg IV Thiamine daily When tube feeds started, monitor magnesium , potassium, and phosphorus daily for at least 3 days, MD to replete as needed, as pt is at risk for refeeding syndrome  NUTRITION DIAGNOSIS:   Moderate Malnutrition related to chronic illness (ESRD on HD/DM and gastroparesis) as evidenced by severe muscle depletion, mild fat depletion.  GOAL:   Patient will meet greater than or equal to 90% of their needs  MONITOR:   Diet advancement, Vent status, Labs, I & O's, TF tolerance  REASON FOR ASSESSMENT:   Consult Enteral/tube feeding initiation and management  ASSESSMENT:  43 year old female who presented as a Code Stroke with IVH. 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 Recent admissions 6/11-7/28 and 7/29-8/13 for ICH with GJ tube.  8/12 - G tube converted to GJ  9/16 - Admitted, EVD placed, intubated,   Pt with extensive prior history briefly pt recently admitted 6/11-7/28 for ICH. During admission pt with N/V due to gastroparesis and AMS. Pt was not able to tolerate any gastric feeds via NGT and needed post pyloric access via NGT. G-tube placed but was unable to use as a GJ tube was supposed to be placed. Still receiving enteral nutrition through post pyloric NGT at this time. Was on a FLD during admission with very poor po intake. Discharged to [facility] with post pyloric  NGT and tube feeds. G tube was converted to GJ at [facility] and enteral nutrition started via J-tube.  Discharged 04/01/2024 to [facility]. Since then pt seen in the ED multiple times for clogged j-tube which they were successful in unclogging it.   Pt now presents with code stroke with ICH, EVD placed, pt intubated. Unable to provide history. RD went in with RN to see if J-tube was functioning. RN tried to flush tube with water  however water  sprayed out of the tube. Tube noted to have multiple cracks. Made MD aware. Plan for IR to exchange tube today and tube feeds to be started at trickles. RD unable to clarify how long tube has not been able to be used for enteral nutrition. Of note pt did vomit yesterday before being intubated.   Pt was on Nepro previously, will switch to Osmolite 1.5 as Nepro may have been contributing to J-tube being clogged. If pt unable to tolerate J-tube feeds or if unable to advance to goal with tube feeds, may require TPN.   Pt's EDW has decreased since July of this year going from 125 lbs to 121 lbs, 4 lb, 3% weight loss in 2 months. This is not clinically significant but is concerning given overall clinical picture. On exam pt with severe muscle and mild fat wasting. Pt with stage 2 PI on buttocks.   Pt meets criteria for moderate malnutrition however unable to verify how long pt has been without enteral nutrition since J-tube was cracked on admission. If pt without nutrition for a prolonged period of time, may qualify as  severe.   MV: 8.9 L/min Temp (24hrs), Avg:98.3 F (36.8 C), Min:97.5 F (36.4 C), Max:99.3 F (37.4 C) MAP (a-line): 67 mmHg  Clevidipine : 28 ml/hr provides 56 kcal x 24 hours   Admit weight: 50.4 kg Current weight: 50.4 kg EDW: 55.2 kg   Intake/Output Summary (Last 24 hours) at 05/20/2024 1443 Last data filed at 05/20/2024 1400 Gross per 24 hour  Intake 875.57 ml  Output 137 ml  Net 738.57 ml   Net IO Since Admission: 738.57 mL [05/20/24  1443]  Drains/Lines: EVD: 78 ml x 24 hours  Nutritionally Relevant Medications: Scheduled Meds:  insulin  aspart  0-9 Units Subcutaneous Q4H   Continuous Infusions:  sodium chloride      clevidipine  14 mg/hr (05/20/24 1400)   dexmedetomidine  (PRECEDEX ) IV infusion 0.3 mcg/kg/hr (05/20/24 1400)   lacosamide  (VIMPAT ) IV     Labs Reviewed: Sodium 138 Potassium 2.7 Creatinine 3.55 Phosphorus 2.4 Magnesium  2.4 GFR 16 CBG ranges from 107-154 mg/dL over the last 24 hours HgbA1c 3.6  NUTRITION - FOCUSED PHYSICAL EXAM:  Flowsheet Row Most Recent Value  Orbital Region Mild depletion  Upper Arm Region Mild depletion  Thoracic and Lumbar Region Mild depletion  Buccal Region Unable to assess  [On vent]  Temple Region Mild depletion  Clavicle Bone Region Mild depletion  Clavicle and Acromion Bone Region Mild depletion  Scapular Bone Region Mild depletion  Dorsal Hand Mild depletion  Patellar Region Severe depletion  Anterior Thigh Region Severe depletion  Posterior Calf Region Unable to assess  Edema (RD Assessment) None  Hair Reviewed  Eyes Unable to assess  Mouth Unable to assess  Skin Reviewed  Nails Reviewed    Diet Order:   Diet Order             Diet NPO time specified  Diet effective now                   EDUCATION NEEDS:   Not appropriate for education at this time  Skin:  Skin Assessment: Skin Integrity Issues: Skin Integrity Issues:: Stage II Stage II: Buttocks  Last BM:  9/15, type 6  Height:   Ht Readings from Last 1 Encounters:  02/14/24 5' 7.5 (1.715 m)    Weight:   Wt Readings from Last 1 Encounters:  05/20/24 50.4 kg    BMI:  Body mass index is 17.15 kg/m.  Estimated Nutritional Needs:   Kcal:  1700-2000 kcal  Protein:  80-100 gm  Fluid:  1 L + UOP   Olivia Kenning, RD Registered Dietitian  See Amion for more information

## 2024-05-20 NOTE — Progress Notes (Signed)
 PT Cancellation Note  Patient Details Name: [name] [name] MRN: [id number] DOB: [birth year]   Cancelled Treatment:    Reason Eval/Treat Not Completed: Active bedrest order;Medical issues which prohibited therapy - intubated, sedated, on bedrest. PT to check back tomorrow.  Casimir Barcellos S, PT DPT Acute Rehabilitation Services Secure Chat Preferred  Office [phone]    Novella Abraha E Stroup 05/20/2024, 9:47 AM

## 2024-05-20 NOTE — Progress Notes (Signed)
 [facility]AST consult for PIV. Attempt for PIV placement. Unable to thread catheter. No other appropriate vein at this time for PIV placement. Notified RN. Recommend CVC line at this time. Powell Bowler, RN VAST

## 2024-05-20 NOTE — Progress Notes (Signed)
 Subjective: The patient is intubated and sedated and in no apparent distress.  Objective: Vital signs in last 24 hours: Temp:  [97.5 F (36.4 C)-99.3 F (37.4 C)] 99.3 F (37.4 C) (09/16 0400) Pulse Rate:  [73-128] 91 (09/16 0754) Resp:  [0-39] 15 (09/16 0754) BP: (61-224)/(40-107) 147/61 (09/16 0700) SpO2:  [99 %-100 %] 100 % (09/16 0754) Arterial Line BP: (106-162)/(39-72) 151/50 (09/16 0701) FiO2 (%):  [40 %-50 %] 40 % (09/16 0754) Weight:  [50.4 kg-54.6 kg] 50.4 kg (09/16 0700) Estimated body mass index is 17.15 kg/m as calculated from the following:   Height as of 02/14/24: 5' 7.5 (1.715 m).   Weight as of this encounter: 50.4 kg.   Intake/Output from previous day: 09/15 0701 - 09/16 0700 In: 369.6 [I.V.:349.6; IV Piggyback:20] Out: 78 [Drains:78] Intake/Output this shift: Total I/O In: -  Out: 27 [Drains:27]  Physical exam vascular, scale 5 intubated, E2M2V1.  Her pupils are small and equal.  She extends to painful stimuli.  I reviewed the patient's follow-up head CT.  Her ventriculostomy is good position.  Her bleed has not changed.  It was not fluctuating so I aseptically flushed approximately distally.  There is good CSF pulsation presently.  Lab Results: Recent Labs    05/19/24 1610 05/19/24 1615 05/19/24 2058 05/20/24 0456  WBC 4.8  --   --  5.1  HGB 12.2   < > 11.6* 9.8*  HCT 38.5   < > 34.0* 30.5*  PLT 114*  --   --  119*   < > = values in this interval not displayed.   BMET Recent Labs    05/19/24 1610 05/19/24 1615 05/19/24 2058 05/20/24 0456  NA 138 139 137 138  K 3.1* 3.0* 2.8* 2.7*  CL 95* 97*  --  95*  CO2 24  --   --  31  GLUCOSE 147* 154*  --  126*  BUN 10 9  --  14  CREATININE 2.80* 2.90*  --  3.55*  CALCIUM  9.3  --   --  9.7    Studies/Results: CT HEAD POST STROKE FOLLOWUP/TIMED/STAT READ Result Date: 05/19/2024 CLINICAL DATA:  Code stroke.  Neuro deficit, acute, stroke suspected EXAM: CT HEAD WITHOUT CONTRAST TECHNIQUE:  Contiguous axial images were obtained from the base of the skull through the vertex without intravenous contrast. RADIATION DOSE REDUCTION: This exam was performed according to the departmental dose-optimization program which includes automated exposure control, adjustment of the mA and/or kV according to patient size and/or use of iterative reconstruction technique. COMPARISON:  None Available. FINDINGS: Brain: No significant change and large volume of acute intraventricular hemorrhage and moderate hydrocephalus. Similar intraparenchymal hemorrhage in the right hippocampal region that is inseparable from the intraventricular hemorrhage. Similar small clustered areas of hyperdensity in the posterior left cerebellum which could represent calcification and/or hemorrhage. Similar involving left parietal infarct and chronic left cerebellar infarcts. No evidence of new/interval acute large vascular territory infarct. Vascular: No hyperdense vessel. Skull: Normal. Negative for fracture or focal lesion. Sinuses/Orbits: Clear sinuses.  No acute orbital findings. IMPRESSION: 1. No significant change in large volume of acute intraventricular hemorrhage and moderate hydrocephalus. 2. Similar intraparenchymal hemorrhage in the right hippocampal region that is inseparable from the intraventricular hemorrhage. 3. Similar small clustered areas of hyperdensity in the posterior left cerebellum which could represent calcification and/or hemorrhage. 4. Similar evolving left parietal infarct and chronic left cerebellar infarcts. Electronically Signed   By: Gilmore GORMAN Molt M.D.   On: 05/19/2024 22:51  DG CHEST PORT 1 VIEW Result Date: 05/19/2024 EXAM: 1 VIEW XRAY OF THE CHEST 05/19/2024 09:07:00 PM COMPARISON: 03/29/2024 CLINICAL HISTORY: Airway intubation performed without difficulty. Encounter for orogastric (OG) tube placement. FINDINGS: LINES, TUBES AND DEVICES: Endotracheal tube in place with tip 3.2 cm above the carina.  Enteric tube in place with tip in proximal stomach and sidehole at level of distal esophagus. Advancement of the catheter by 10 to 15 cm with more optimally positioned device. LUNGS AND PLEURA: No focal pulmonary opacity. No pulmonary edema. No pleural effusion. No pneumothorax. HEART AND MEDIASTINUM: Mild cardiomegaly. No acute abnormality of the cardiac and mediastinal silhouettes. BONES AND SOFT TISSUES: No acute osseous abnormality. IMPRESSION: 1. Endotracheal tube in place with tip 3.2 cm above the carina. 2. Enteric tube in place with tip in proximal stomach and sidehole at level of distal esophagus. Advancement of the catheter by 10-15 cm is recommended for more optimal positioning. 3. Mild cardiomegaly. Electronically signed by: Dorethia Molt MD 05/19/2024 09:14 PM EDT RP Workstation: [id number]   CT HEAD CODE STROKE WO CONTRAST Result Date: 05/19/2024 CLINICAL DATA:  Code stroke. Neuro deficit, acute, stroke suspected. EXAM: CT HEAD WITHOUT CONTRAST TECHNIQUE: Contiguous axial images were obtained from the base of the skull through the vertex without intravenous contrast. RADIATION DOSE REDUCTION: This exam was performed according to the departmental dose-optimization program which includes automated exposure control, adjustment of the mA and/or kV according to patient size and/or use of iterative reconstruction technique. COMPARISON:  Head CT and MRI 03/10/2024 FINDINGS: Brain: There is new large volume acute hemorrhage filling the majority of the right lateral ventricle and smaller volume hemorrhage in the left lateral ventricle. There is increased, moderate dilatation of the lateral, third, and fourth ventricles. There is acute parenchymal hemorrhage in the medial right temporal lobe/hippocampus which is inseparable from the intraventricular hemorrhage. Small clustered hyperdense foci in the posterior aspect of the left cerebellar hemisphere may reflect residual/recurrent hemorrhage and/or  mineralization. The left parietal infarct has evolved and demonstrates mild encephalomalacia with resolution of gyral hyperdensity on the prior CT. A chronic infarct in the medial left cerebellar hemisphere is unchanged. No definite acute infarct, midline shift, or extra-axial fluid collection is identified. Vascular: Calcified atherosclerosis at the skull base. No hyperdense vessel. Skull: Right frontal burr hole. Sinuses/Orbits: Suspected mucous retention cyst in the left maxillary sinus. Bilateral mastoid effusions. Bilateral cataract extraction. Other: None. ASPECTS [facility] Stroke Program Early CT Score) Not scored in the presence of acute hemorrhage. These results were communicated to Dr. Arora at 4:30 pm on 05/19/2024 by text page via the [facility] messaging system. IMPRESSION: 1. Large volume acute intraventricular hemorrhage with moderate hydrocephalus. 2. Acute hemorrhage in the medial right temporal lobe, inseparable from the intraventricular hemorrhage. 3. Small foci of residual/recurrent hemorrhage and/or mineralization in the left cerebellar hemisphere. Electronically Signed   By: Dasie Hamburg M.D.   On: 05/19/2024 16:32    Assessment/Plan: Repeated intracerebral hemorrhage, hydrocephalus, ventriculostomy: As above the patient's ventriculostomy was not fluctuating.  It is working now.  I would suggest discussing goals of care with her family as the patient is not healthy and has had repeated bleeds.  LOS: 1 day     [name] [name] 05/20/2024, 7:57 AM     Patient ID: [name] [name], female   DOB: 04-01-1981, 43 y.o.   MRN: [id number]

## 2024-05-20 NOTE — Progress Notes (Signed)
 Pt receives out-pt HD at [facility] [place] on MWF 11:45 am chair time. Will assist as needed.  Randine Mungo Dialysis Navigator [phone]

## 2024-05-20 NOTE — H&P (Signed)
 NAME:  [name] [name], MRN:  [id number], DOB:  1980-10-07, LOS: 1 ADMISSION DATE:  05/19/2024 CONSULTATION DATE:  9/15/20025 REFERRING MD: Patsey - EDP, CHIEF COMPLAINT:  Code Stroke, ICH   History of Present Illness:  43 year old woman who presented to [facility] ED 9/15 as a Code Stroke. 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 Recent admissions 6/11-7/28 and 7/29-8/13 for ICH.  Patient presented to [facility] ED from dialysis as a Code Stroke; reportedly LKW 1450 with development of obtundation and hemiparesis. SBP 200s. NIHSS 31 with initial leftward gaze, decreased LOC/disorientation, bilateral facial droop/arm weakness/leg weakness, global aphasia, dysarthria, visual neglect. CT Head demonstrated large volume IVH with moderate hydrocephalus, acute hemorrhage of medial R temporal lobe and small foci of residual/recurrent hemorrhage in the L cerebellar hemisphere.   Patient was seen by stroke team and neurosurgery, EVD was placed on left side, PCCM was consulted for medical evaluation and admission to neuro ICU  Pertinent Medical History:   Past Medical History:  Diagnosis Date   Anemia    Anxiety    DM (diabetes mellitus), type 1 (HCC)    ESRD on hemodialysis (HCC)    Gastroparesis    History of simultaneous kidney and pancreas transplant (HCC) 2014   Hypertension    Ischemic cerebrovascular accident (CVA) (HCC) 11/2021   Narcotic abuse (HCC)    Non compliance with medical treatment    Stroke [facility])    Vision loss    Significant Hospital Events: Including procedures, antibiotic start and stop dates in addition to other pertinent events   9/15 admitted with ICH plus IVH, EVD was placed for hydrocephalus  Interim History / Subjective:  No overnight issues Remain on clevidipine  infusion to maintain SBP 130-150 Tolerating  spontaneous breathing trial but mental status precludes extubation Repeat head CT showing persistent right parietotemporal hemorrhage with IVH and moderate hydrocephalus, EVD is in place  Objective:  Blood pressure (!) 147/61, pulse 91, temperature 99 F (37.2 C), temperature source Axillary, resp. rate 15, weight 50.4 kg, SpO2 100%.    Vent Mode: PRVC FiO2 (%):  [40 %-50 %] 40 % Set Rate:  [12 bmp-16 bmp] 12 bmp Vt Set:  [490 mL] 490 mL PEEP:  [5 cmH20] 5 cmH20 Plateau Pressure:  [15 cmH20-18 cmH20] 17 cmH20   Intake/Output Summary (Last 24 hours) at 05/20/2024 0837 Last data filed at 05/20/2024 0740 Gross per 24 hour  Intake 369.62 ml  Output 105 ml  Net 264.62 ml   Filed Weights   05/19/24 1600 05/20/24 0700  Weight: 54.6 kg 50.4 kg   Physical Examination: General: Crtitically ill-appearing middle-aged female, orally intubated HEENT: [place]/AT, eyes anicteric.  ETT and OGT in place.  Left frontal EVD in place, draining bloody fluid Neuro: Eyes closed, does not open, not following commands, pupils pinpoint, minimally reactive to light, extensor response to painful stimuli in left upper extremity, withdrawing in bilateral lower extremities, plegic on right upper extremity Chest: Coarse breath sounds, no wheezes or rhonchi Heart: Regular rate and rhythm, no murmurs or gallops Abdomen: Soft, nondistended, bowel sounds present   Labs and images reviewed  Patient Lines/Drains/Airways Status     Active Line/Drains/Airways     Name Placement date Placement time Site Days   Arterial Line 05/19/24 Left Radial 05/19/24  2045  Radial  1   Peripheral IV 05/19/24 Anterior;Distal;Left Wrist 05/19/24  1859  Wrist  1   Peripheral IV 05/19/24 22 G 2.5 Left;Anterior Forearm 05/19/24  2251  Forearm  1   Fistula / Graft Right Upper arm Arteriovenous fistula 12/08/20  1403  Upper arm  1259   NG/OG Vented/Dual Lumen 10 Fr. Right nare 03/20/24  1700  Right nare  61   NG/OG Vented/Dual Lumen 16  Fr. Oral Marking at nare/corner of mouth 66 cm 05/19/24  2100  Oral  1   Gastrostomy/Enterostomy Gastrostomy;Jejunostomy 18 Fr. LUQ 04/15/24  1329  LUQ  35   ICP/Ventriculostomy Ventricular drainage catheter Left Parietal region 05/19/24  1730  Parietal region  1   Airway 7.5 mm 05/19/24  1842  -- 1   Wound 05/19/24 1933 Pressure Injury Buttocks Bilateral Stage 2 -  Partial thickness loss of dermis presenting as a shallow open injury with a red, pink wound bed without slough. 05/19/24  1933  Buttocks  1         Resolved Hospital Problem List:    Assessment & Plan:  Acute right parietotemporal intraparenchymal hemorrhage with IVH with ICH score 2 due to uncontrolled hypertension Hypertensive emergency Obstructive hydrocephalus status post EVD Acute left parietal ischemic stroke Acute encephalopathy in the setting of ICH and hydrocephalus Acute respiratory insufficiency End-stage renal disease on hemodialysis with failed renal transplant Hypokalemia/hypophosphatemia Type 1 diabetes, complicated with vasculopathy and gastroparesis Narcotic abuse  Continue neuro watch Stroke team is following Left frontal EVD in place, currently at 10 cm water , draining bloody fluid EVD was flushed by neurosurgery this morning Monitor output Maintain SBP 130-150, blood pressure is not well-controlled Currently on clevidipine  infusion Started on amlodipine  and clonidine , titrate clevidipine  to maintain SBP goal Avoid antiplatelet and anticoagulation Repeat head CT showing stable right prior to temporal intraparenchymal hemorrhage with IVH and moderate hydrocephalus Also CT head showed evolving left parietal ischemic stroke Minimize sedation with RASS goal 0/-1, currently on Precedex  Continue lung protective ventilation VAP prevention bundle in place Tolerating spontaneous breathing trial but mental status precludes extubation Nephrology is consulted for hemodialysis needs Continue aggressive  electrolyte replacement Blood sugars are controlled Will start tube feeds  Labs:  CBC: Recent Labs  Lab 05/19/24 1610 05/19/24 1615 05/19/24 2058 05/20/24 0456  WBC 4.8  --   --  5.1  NEUTROABS 4.2  --   --   --   HGB 12.2 12.9 11.6* 9.8*  HCT 38.5 38.0 34.0* 30.5*  MCV 98.2  --   --  96.8  PLT 114*  --   --  119*   Basic Metabolic Panel: Recent Labs  Lab 05/19/24 1610 05/19/24 1615 05/19/24 2058 05/20/24 0456  NA 138 139 137 138  K 3.1* 3.0* 2.8* 2.7*  CL 95* 97*  --  95*  CO2 24  --   --  31  GLUCOSE 147* 154*  --  126*  BUN 10 9  --  14  CREATININE 2.80* 2.90*  --  3.55*  CALCIUM  9.3  --   --  9.7  MG  --   --   --  2.4  PHOS  --   --   --  2.4*   GFR: Estimated Creatinine Clearance: 16.4 mL/min (A) (by C-G formula based on SCr of 3.55 mg/dL (H)). Recent Labs  Lab 05/19/24 1610 05/20/24 0456  WBC 4.8 5.1   Liver Function Tests: Recent Labs  Lab 05/19/24 1610  AST 23  ALT 19  ALKPHOS 88  BILITOT 0.9  PROT 7.3  ALBUMIN 3.6  No results for input(s): LIPASE, AMYLASE in the last 168 hours. No results for input(s): AMMONIA in the last 168 hours.  ABG:    Component Value Date/Time   PHART 7.610 (HH) 05/19/2024 2058   PCO2ART 35.1 05/19/2024 2058   PO2ART 233 (H) 05/19/2024 2058   HCO3 35.3 (H) 05/19/2024 2058   TCO2 36 (H) 05/19/2024 2058   ACIDBASEDEF 5.2 (H) 01/13/2015 0300   O2SAT 100 05/19/2024 2058   Coagulation Profile: Recent Labs  Lab 05/19/24 1610 05/20/24 0456  INR 1.1 1.1   Cardiac Enzymes: No results for input(s): CKTOTAL, CKMB, CKMBINDEX, TROPONINI in the last 168 hours.  HbA1C: Hgb A1c MFr Bld  Date/Time Value Ref Range Status  05/20/2024 04:56 AM 3.6 (L) 4.8 - 5.6 % Final    Comment:    (NOTE) Diagnosis of Diabetes The following HbA1c ranges recommended by the American Diabetes Association (ADA) may be used as an aid in the diagnosis of diabetes mellitus.  Hemoglobin             Suggested A1C NGSP%               Diagnosis  <5.7                   Non Diabetic  5.7-6.4                Pre-Diabetic  >6.4                   Diabetic  <7.0                   Glycemic control for                       adults with diabetes.    02/14/2024 09:49 AM 4.2 (L) 4.8 - 5.6 % Final    Comment:    (NOTE) Diagnosis of Diabetes The following HbA1c ranges recommended by the American Diabetes Association (ADA) may be used as an aid in the diagnosis of diabetes mellitus.  Hemoglobin             Suggested A1C NGSP%              Diagnosis  <5.7                   Non Diabetic  5.7-6.4                Pre-Diabetic  >6.4                   Diabetic  <7.0                   Glycemic control for                       adults with diabetes.     CBG: Recent Labs  Lab 05/19/24 1609 05/19/24 1921 05/19/24 2328 05/20/24 0326 05/20/24 0727  GLUCAP 154* 144* 142* 127* 107*   The patient is critically ill due to acute right parietotemporal intraparenchymal hemorrhage with IVH in the setting of hypertensive emergency.  Critical care was necessary to treat or prevent imminent or life-threatening deterioration.  Critical care was time spent personally by me on the following activities: development of treatment plan with patient and/or surrogate as well as nursing, discussions with consultants, evaluation of patient's response to treatment, examination of patient, obtaining history from patient or surrogate, ordering and  performing treatments and interventions, ordering and review of laboratory studies, ordering and review of radiographic studies, pulse oximetry, re-evaluation of patient's condition and participation in multidisciplinary rounds.   During this encounter critical care time was devoted to patient care services described in this note for 41 minutes.     Valinda Novas, MD [facility] See Amion for pager If no response to pager, please call [phone] until 7pm After 7pm, Please  call E-link [phone]

## 2024-05-20 NOTE — TOC Initial Note (Signed)
 Transition of Care [facility]) - Initial/Assessment Note    Patient Details  Name: [name] [name] MRN: [id number] Date of Birth: Feb 05, 1981  Transition of Care [facility]) CM/SW Contact:    Inocente GORMAN Kindle, LCSW Phone Number: 05/20/2024, 4:17 PM  Clinical Narrative:                 Patient admitted from [facility] sent by Dialysis Center. Was at [facility] prior to SNF. CSW continuing to follow. Patient will require insurance approval to return to SNF and is currently intubated.   Expected Discharge Plan: Skilled Nursing Facility Barriers to Discharge: Continued Medical Work up, English as a second language teacher   Patient Goals and CMS Choice            Expected Discharge Plan and Services In-house Referral: Clinical Social Work   Post Acute Care Choice: Skilled Nursing Facility Living arrangements for the past 2 months: Apartment                                      Prior Living Arrangements/Services Living arrangements for the past 2 months: Apartment Lives with:: Self Patient language and need for interpreter reviewed:: Yes Do you feel safe going back to the place where you live?: Yes      Need for Family Participation in Patient Care: Yes (Comment) Care giver support system in place?: Yes (comment)   Criminal Activity/Legal Involvement Pertinent to Current Situation/Hospitalization: No - Comment as needed  Activities of Daily Living      Permission Sought/Granted Permission sought to share information with : Facility Medical sales representative, Family Supports Permission granted to share information with : Yes, Verbal Permission Granted  Share Information with NAME: Nori [phone]  Permission granted to share info w AGENCY: [facility]        Emotional Assessment Appearance:: Appears stated age Attitude/Demeanor/Rapport: Unable to Assess Affect (typically observed): Unable to Assess Orientation: :  (intubated) Alcohol  / Substance Use: Not Applicable Psych  Involvement: No (comment)  Admission diagnosis:  ICH (intracerebral hemorrhage) (HCC) [I61.9] Patient Active Problem List   Diagnosis Date Noted   ICH (intracerebral hemorrhage) (HCC) 05/19/2024   Pontine hemorrhage (HCC) 02/15/2024   Malnutrition of moderate degree 02/15/2024   SAH (subarachnoid hemorrhage) (HCC) 02/15/2024   Colitis 02/14/2024   History of diabetic gastroparesis 02/14/2024   History of pancreatic islet cell transplantation 02/14/2024   Acute metabolic encephalopathy 02/14/2024   Prolonged QT interval 02/14/2024   Controlled type 2 diabetes mellitus with complication, without long-term current use of insulin  (HCC) 02/14/2024   Sepsis (HCC) 02/14/2024   Chronic, continuous use of opioids 11/12/2023   Need for acute hemodialysis due to missed session(HCC) 11/12/2023   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/12/2023   Vaginal bleeding 06/13/2023   ESRD needing dialysis (HCC) 03/07/2022   Chronic pain 03/07/2022   ESRD (end stage renal disease) (HCC)    Hypertensive emergency 01/31/2022   Hypertensive crisis 01/30/2022   Hypervolemia associated with renal insufficiency 12/23/2021   Hemorrhagic gastritis    Subdural hematoma (HCC) 12/02/2021   Acute blood loss anemia 12/02/2021   Chronic abdominal pain 12/02/2021   Thrombocytopenia (HCC) 12/02/2021   Abnormal CT of the abdomen 12/02/2021   Type 1 diabetes (HCC) 12/02/2021   Fever 11/07/2021   Acute encephalopathy 11/06/2021   Pulmonary edema 11/06/2021   Hypertensive urgency 11/06/2021   Type 2 diabetes mellitus with peripheral neuropathy (HCC) 11/06/2021  History of renal transplant 11/06/2021   COVID-19 virus infection 11/06/2021   HTN (hypertension), malignant 08/02/2021   ESRD on dialysis (HCC) 02/25/2021   Coagulation defect, unspecified (HCC) 12/22/2020   Acute cough 10/04/2020   Allergy, unspecified, initial encounter 05/21/2020   Anaphylactic shock, unspecified, initial  encounter 05/21/2020   Pancytopenia (HCC) 02/27/2020   Shortness of breath 04/23/2019   Hypocalcemia 12/30/2018   Aortic atherosclerosis (HCC) 12/27/2018   Acute respiratory failure with hypoxia (HCC) 12/22/2018   Hyperkalemia    Bilateral pneumonia 12/21/2018   Pancreas transplant status (HCC) 12/21/2018   Immunosuppression (HCC) 12/21/2018   Chest pain 12/21/2018   CAP (community acquired pneumonia) 12/20/2018   Other staphylococcus as the cause of diseases classified elsewhere 08/16/2018   Mild protein-calorie malnutrition (HCC) 04/18/2018   Encounter for screening for respiratory tuberculosis 04/06/2018   Gout 04/06/2018   Iron  deficiency anemia, unspecified 04/06/2018   Leiomyoma of uterus, unspecified 04/06/2018   Secondary hyperparathyroidism of renal origin (HCC) 04/06/2018   Acute rejection of kidney transplant 07/11/2017   Elevated serum creatinine 06/27/2017   E. coli UTI 12/18/2014   Transplant rejection 09/03/2014   Cyst of ovary, left 09/01/2014   Immunosuppressive management encounter following kidney transplant 08/26/2014   Acne 04/28/2014   Itch of skin 04/28/2014   Diabetic gastroparesis associated with type 1 diabetes mellitus (HCC) 11/07/2013   Nausea and vomiting 11/07/2013   Lesion of spleen 10/07/2013   Metabolic acidosis 09/25/2013   Patient's noncompliance with other medical treatment and regimen 09/12/2013   Narcotic dependence (HCC) 08/31/2013   Generalized pain 08/30/2013   Hypomagnesemia 08/30/2013   Failed kidney transplant 08/21/2013   Hypophosphatemia 07/28/2013   End stage renal disease (HCC) 02/06/2012   Pleural effusion, bilateral 11/16/2011   Anemia in chronic kidney disease (CKD) 11/16/2011   Pericardial effusion 11/15/2011   Fluid overload 11/15/2011   Abdominal pain, epigastric 11/15/2011   CKD (chronic kidney disease) stage 5, GFR less than 15 ml/min (HCC) 11/03/2011   Hypertension 10/27/2011   Renal failure, chronic 10/27/2011    MEDIAL EPICONDYLITIS, LEFT 12/17/2007   ABSCESS, AXILLA, LEFT 12/03/2007   Type 2 diabetes mellitus with other diabetic kidney complication (HCC) 11/05/2007   PCP:  [facility]:   [facility]  - [place], [place] - [address] [place] [address] Phone: [phone] Fax: [phone]     Social Drivers of Health (SDOH) Social History: SDOH Screenings   Food Insecurity: Patient Unable To Answer (02/15/2024)  Housing: Low Risk  (03/05/2024)  Transportation Needs: No Transportation Needs (04/16/2024)   Received from [facility]  Utilities: Patient Unable To Answer (02/15/2024)  Social Connections: Unknown (10/04/2022)   Received from [facility]  Stress: No Stress Concern Present (04/16/2024)   Received from [facility]  Tobacco Use: Medium Risk (04/27/2024)   SDOH Interventions:     Readmission Risk Interventions    05/20/2024    4:16 PM 03/31/2024   11:27 AM 11/17/2021   12:15 PM  Readmission Risk Prevention Plan  Transportation Screening Complete Complete Complete  Medication Review [employer]) Complete Complete Complete  PCP or Specialist appointment within 3-5 days of discharge Complete  Complete  HRI or Home Care Consult Complete Complete Complete  SW Recovery Care/Counseling Consult Complete Complete Complete  Palliative Care Screening Complete Not Applicable Not Applicable  Skilled Nursing Facility Complete Not Applicable Patient Refused

## 2024-05-20 NOTE — TOC CAGE-AID Note (Signed)
 Transition of Care [facility]) - CAGE-AID Screening   Patient Details  Name: [name] [name] MRN: [id number] Date of Birth: 31-May-1981  [facility]) CM/SW Contact:    Railyn House E Karlita Lichtman, LCSW Phone Number: 05/20/2024, 8:49 AM   Clinical Narrative: Currently intubated.   CAGE-AID Screening: Substance Abuse Screening unable to be completed due to: : Patient unable to participate

## 2024-05-20 NOTE — Progress Notes (Addendum)
 STROKE TEAM PROGRESS NOTE    SIGNIFICANT HOSPITAL EVENTS 9/15- Admitted with large ICH, IVH   INTERIM HISTORY/SUBJECTIVE Intubated, Precedex  infusing.  MRI and MRA today.  EEG ordered Palliative care consult placed by CCM.  Additionally she will need her GJ tube replaced by IR.   OBJECTIVE  CBC    Component Value Date/Time   WBC 5.1 05/20/2024 0456   RBC 3.15 (L) 05/20/2024 0456   HGB 9.8 (L) 05/20/2024 0456   HCT 30.5 (L) 05/20/2024 0456   PLT 119 (L) 05/20/2024 0456   MCV 96.8 05/20/2024 0456   MCH 31.1 05/20/2024 0456   MCHC 32.1 05/20/2024 0456   RDW 14.8 05/20/2024 0456   LYMPHSABS 0.4 (L) 05/19/2024 1610   MONOABS 0.2 05/19/2024 1610   EOSABS 0.0 05/19/2024 1610   BASOSABS 0.0 05/19/2024 1610    BMET    Component Value Date/Time   NA 138 05/20/2024 0456   K 2.7 (LL) 05/20/2024 0456   CL 95 (L) 05/20/2024 0456   CO2 31 05/20/2024 0456   GLUCOSE 126 (H) 05/20/2024 0456   BUN 14 05/20/2024 0456   CREATININE 3.55 (H) 05/20/2024 0456   CALCIUM  9.7 05/20/2024 0456   GFRNONAA 16 (L) 05/20/2024 0456    IMAGING past 24 hours CT HEAD POST STROKE FOLLOWUP/TIMED/STAT READ Result Date: 05/19/2024 CLINICAL DATA:  Code stroke.  Neuro deficit, acute, stroke suspected EXAM: CT HEAD WITHOUT CONTRAST TECHNIQUE: Contiguous axial images were obtained from the base of the skull through the vertex without intravenous contrast. RADIATION DOSE REDUCTION: This exam was performed according to the departmental dose-optimization program which includes automated exposure control, adjustment of the mA and/or kV according to patient size and/or use of iterative reconstruction technique. COMPARISON:  None Available. FINDINGS: Brain: No significant change and large volume of acute intraventricular hemorrhage and moderate hydrocephalus. Similar intraparenchymal hemorrhage in the right hippocampal region that is inseparable from the intraventricular hemorrhage. Similar small clustered areas of  hyperdensity in the posterior left cerebellum which could represent calcification and/or hemorrhage. Similar involving left parietal infarct and chronic left cerebellar infarcts. No evidence of new/interval acute large vascular territory infarct. Vascular: No hyperdense vessel. Skull: Normal. Negative for fracture or focal lesion. Sinuses/Orbits: Clear sinuses.  No acute orbital findings. IMPRESSION: 1. No significant change in large volume of acute intraventricular hemorrhage and moderate hydrocephalus. 2. Similar intraparenchymal hemorrhage in the right hippocampal region that is inseparable from the intraventricular hemorrhage. 3. Similar small clustered areas of hyperdensity in the posterior left cerebellum which could represent calcification and/or hemorrhage. 4. Similar evolving left parietal infarct and chronic left cerebellar infarcts. Electronically Signed   By: Gilmore GORMAN Molt M.D.   On: 05/19/2024 22:51   DG CHEST PORT 1 VIEW Result Date: 05/19/2024 EXAM: 1 VIEW XRAY OF THE CHEST 05/19/2024 09:07:00 PM COMPARISON: 03/29/2024 CLINICAL HISTORY: Airway intubation performed without difficulty. Encounter for orogastric (OG) tube placement. FINDINGS: LINES, TUBES AND DEVICES: Endotracheal tube in place with tip 3.2 cm above the carina. Enteric tube in place with tip in proximal stomach and sidehole at level of distal esophagus. Advancement of the catheter by 10 to 15 cm with more optimally positioned device. LUNGS AND PLEURA: No focal pulmonary opacity. No pulmonary edema. No pleural effusion. No pneumothorax. HEART AND MEDIASTINUM: Mild cardiomegaly. No acute abnormality of the cardiac and mediastinal silhouettes. BONES AND SOFT TISSUES: No acute osseous abnormality. IMPRESSION: 1. Endotracheal tube in place with tip 3.2 cm above the carina. 2. Enteric tube in place with tip in proximal  stomach and sidehole at level of distal esophagus. Advancement of the catheter by 10-15 cm is recommended for more  optimal positioning. 3. Mild cardiomegaly. Electronically signed by: Dorethia Molt MD 05/19/2024 09:14 PM EDT RP Workstation: [id number]   CT HEAD CODE STROKE WO CONTRAST Result Date: 05/19/2024 CLINICAL DATA:  Code stroke. Neuro deficit, acute, stroke suspected. EXAM: CT HEAD WITHOUT CONTRAST TECHNIQUE: Contiguous axial images were obtained from the base of the skull through the vertex without intravenous contrast. RADIATION DOSE REDUCTION: This exam was performed according to the departmental dose-optimization program which includes automated exposure control, adjustment of the mA and/or kV according to patient size and/or use of iterative reconstruction technique. COMPARISON:  Head CT and MRI 03/10/2024 FINDINGS: Brain: There is new large volume acute hemorrhage filling the majority of the right lateral ventricle and smaller volume hemorrhage in the left lateral ventricle. There is increased, moderate dilatation of the lateral, third, and fourth ventricles. There is acute parenchymal hemorrhage in the medial right temporal lobe/hippocampus which is inseparable from the intraventricular hemorrhage. Small clustered hyperdense foci in the posterior aspect of the left cerebellar hemisphere may reflect residual/recurrent hemorrhage and/or mineralization. The left parietal infarct has evolved and demonstrates mild encephalomalacia with resolution of gyral hyperdensity on the prior CT. A chronic infarct in the medial left cerebellar hemisphere is unchanged. No definite acute infarct, midline shift, or extra-axial fluid collection is identified. Vascular: Calcified atherosclerosis at the skull base. No hyperdense vessel. Skull: Right frontal burr hole. Sinuses/Orbits: Suspected mucous retention cyst in the left maxillary sinus. Bilateral mastoid effusions. Bilateral cataract extraction. Other: None. ASPECTS [facility] Stroke Program Early CT Score) Not scored in the presence of acute hemorrhage. These results were  communicated to Dr. Arora at 4:30 pm on 05/19/2024 by text page via the [facility] messaging system. IMPRESSION: 1. Large volume acute intraventricular hemorrhage with moderate hydrocephalus. 2. Acute hemorrhage in the medial right temporal lobe, inseparable from the intraventricular hemorrhage. 3. Small foci of residual/recurrent hemorrhage and/or mineralization in the left cerebellar hemisphere. Electronically Signed   By: Dasie Hamburg M.D.   On: 05/19/2024 16:32    Vitals:   05/20/24 0715 05/20/24 0730 05/20/24 0745 05/20/24 0754  BP:      Pulse: 88 92 96 91  Resp: 12 13 14 15   Temp:      TempSrc:      SpO2: 100% 100% 100% 100%  Weight:         PHYSICAL EXAM General: Largely unresponsive CV: Regular rate and rhythm on monitor Respiratory: Mechanically ventilated GI: GJ tube in place   NEURO:  Mental Status: Eyes open with noxious stimuli, not following commands  Cranial Nerves:  II: PERRL.  III, IV, VI: Gaze disconjugate VII: Face is symmetrical resting and smiling VIII:  IX, X: Cough and gag intact XI: Head is midline XII: tongue is midline without fasciculations. Motor: Withdraws to noxious stimuli, decerebrate posturing on the left. No purposeful movement noted.  Tone: is normal and bulk is normal Coordination deferred Gait- deferred  Most Recent NIH 24   ASSESSMENT/PLAN  [name] is a 43 y.o. female with history of hypertension, ESRD on HD status post renal and pancreatic transplants, diabetes type 1, gastroparesis, multiple strokes both ischemic and hemorrhagic along with prepontine subarachnoid hemorrhage which was angio negative, and during last admission had hydrocephalus requiring EVD placement, then discharged to a nursing facility-today had a change in mentation at dialysis where she had a leftward gaze and left-sided weakness for which  EMS was called.Her blood pressures were in the systolic 200s.    ICH:  right temporal lobe ICH with IVH extension,  etiology:  likely hypertension  Code Stroke CT head - Large volume acute intraventricular hemorrhage with moderate hydrocephalus. Acute hemorrhage in the medial right temporal lobe, inseparable from the intraventricular hemorrhage. Small foci of residual/recurrent hemorrhage and/or mineralization in the left cerebellar hemisphere. 9/15- repeat CT head - No significant change in large volume of acute intraventricular hemorrhage and moderate hydrocephalus. Similar intraparenchymal hemorrhage in the right hippocampal region that is inseparable from the intraventricular hemorrhage.  MRI   Intraventricular hemorrhage with ventriculomegaly and left frontal ventriculostomy tube in place. No acute ischemic infarct. 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 MRA motion-degraded, no significant stenosis 2D Echo EF 60-65% in 03/2024 LDL 38 HgbA1c 3.6 VTE prophylaxis - SCDs No antithrombotic prior to admission, now on No antithrombotic Therapy recommendations:  Pending Disposition:  Pending, palliative care on board, family meeting tomorrow  Hx of ICH 02/2024- Perimesencephalic SAH with IVH and hydrocephalus.  Status post EVD.  Cerebral angiogram no aneurysm or AVM but diffuse anterior circulation vasospasm.  MRI [place] small couple callosum and cerebellar infarct.  EF 60 to 65%, LDL 63, A1c 4.2.  EEG sharp waves, concerning for seizure activity, put on Vimpat .  Patient has prolonged hospitalization and enterally discharged to SNF.  Hx of Seizures Concerning for seizure during last admission for [facility] and IVH. Discharged on lacosamide   EEG generalized periodic discharges with triphasic morphology. This EEG pattern can be seen due to toxic-metabolic causes. However, due to patient's complicated history, can also be on the ictal-interictal continuum but with low suspicion for seizure recurrence.  Continue home lacosamide  100 mg twice daily  Obstructive  hydrocephalus EVD in place at 10 cm of water , flushed this morning by neurosurgery with good CSF pulsation after Management per NSGY CT and MRI no significant midline shift  Respiratory failure  Obtunded on arrival to ED, intubated by CCM SBT/VAP per CCM Is having episodes of apnea on wean  Hypertension Home meds:  Hydralazine , Amlodipine  and clonidine   On Cleviprex   BP goal less than 160 On amlodipine  and clonidine   Diabetes type I Controlled Hx of pancreas transplant Home meds:  Cellcept , prednisone , and cyclosporine   HgbA1c 3.6, goal < 7.0 CBGs SSI Recommend close follow-up with PCP for better DM control  ESRD on HD Hx of renal transplant (failed) Home meds: Cellcept , prednisone , and cyclosporine   Creatinine 2.80 heparin  heparin  3.55 HD schedule is MWF Nephrology consulted  Dysphagia Patient has GJ tube, however GJ tube clogged Okay to use OG tube for medication only IR consulted to replace tube  Other stroke risk factors  Other medical issues Hypokalemia, K 3.1--2.8-3.6  Hospital day # 1  Patient seen and examined by NP/APP with MD. MD to update note as needed.   Jorene Last, DNP, FNP-BC [name] Pager: [phone]  ATTENDING NOTE: I reviewed above note and agree with the assessment and plan. Pt was seen and examined.   No family at bedside.  Patient still intubated, not responsive.  On Precedex .  With repetitive stimulation, patient able to open eyes but not blinking to visual threat, not following any commands.  Doll's eyes present, pupil equal size but sluggish, corneal reflexes right stronger than left.  Cough and gag present.  Complaints mentation, left upper extremity extension posturing, no significant movement in other extremities.  However RN reported spontaneous toe  movement on the left.  Patient ICH and IVH likely due to hypertension in the setting of significantly elevated BP on presentation.  Currently BP goal less than 160, on  Cleviprex , started p.o. medications through OG tube.  Supportive care with ventilation.  IR consulted for GJ tube replacement.  Palliative care on board for GOC discussion with family, family meeting set up at 2 PM tomorrow.  EVD patent, appreciate CCM and neurosurgery assistance.  Will follow  For detailed assessment and plan, please refer to above as I have made changes wherever appropriate.   Ary Cummins, MD PhD Stroke Neurology 05/20/2024 6:25 PM  This patient is critically ill due to recurrent ICH, IVH, hydrocephalus status post EVD placement, respiratory failure and at significant risk of neurological worsening, death form obstructive hydrocephalus, increased ICP, recurrent ICH, sepsis. This patient's care requires constant monitoring of vital signs, hemodynamics, respiratory and cardiac monitoring, review of multiple databases, neurological assessment, discussion with family, other specialists and medical decision making of high complexity. I spent 40 minutes of neurocritical care time in the care of this patient.  I discussed with Dr. Harold CCM    To contact Stroke Continuity provider, please refer to [email or web address]. After hours, contact General Neurology

## 2024-05-20 NOTE — Procedures (Signed)
 Central Venous Catheter Insertion Procedure Note  Caylan Avalyn Molino  [id number]  Apr 02, 1981  Date:05/20/24  Time:2:13 PM   Provider Performing:Kirk Sampley   Procedure: Insertion of Non-tunneled Central Venous Catheter(36556) with US  guidance (23062)   Indication(s) Medication administration  Consent Risks of the procedure as well as the alternatives and risks of each were explained to the patient and/or caregiver.  Consent for the procedure was obtained and is signed in the bedside chart  Anesthesia Topical only with 1% lidocaine    Timeout Verified patient identification, verified procedure, site/side was marked, verified correct patient position, special equipment/implants available, medications/allergies/relevant history reviewed, required imaging and test results available.  Sterile Technique Maximal sterile technique including full sterile barrier drape, hand hygiene, sterile gown, sterile gloves, mask, hair covering, sterile ultrasound probe cover (if used).  Procedure Description Area of catheter insertion was cleaned with chlorhexidine  and draped in sterile fashion.  With real-time ultrasound guidance a central venous catheter was placed into the left subclavian vein. Nonpulsatile blood flow and easy flushing noted in all ports.  The catheter was sutured in place and sterile dressing applied.  Complications/Tolerance None; patient tolerated the procedure well. Chest X-ray is ordered to verify placement for internal jugular or subclavian cannulation.   Chest x-ray is not ordered for femoral cannulation.  EBL Minimal  Specimen(s) None

## 2024-05-20 NOTE — Progress Notes (Signed)
 SLP Cancellation Note  Patient Details Name: [name] [name] MRN: [id number] DOB: [birth year]   Cancelled treatment:       Reason Eval/Treat Not Completed: Medical issues which prohibited therapy (intubated and sedated). SLP will f/u as able.    Damien Blumenthal, M.A., CCC-SLP Speech Language Pathology, Acute Rehabilitation Services  Secure Chat preferred [phone]  05/20/2024, 2:26 PM

## 2024-05-21 ENCOUNTER — Inpatient Hospital Stay (HOSPITAL_COMMUNITY)

## 2024-05-21 DIAGNOSIS — E876 Hypokalemia: Secondary | ICD-10-CM | POA: Diagnosis not present

## 2024-05-21 DIAGNOSIS — I120 Hypertensive chronic kidney disease with stage 5 chronic kidney disease or end stage renal disease: Secondary | ICD-10-CM | POA: Diagnosis not present

## 2024-05-21 DIAGNOSIS — E440 Moderate protein-calorie malnutrition: Secondary | ICD-10-CM

## 2024-05-21 DIAGNOSIS — R29724 NIHSS score 24: Secondary | ICD-10-CM | POA: Diagnosis not present

## 2024-05-21 DIAGNOSIS — I619 Nontraumatic intracerebral hemorrhage, unspecified: Secondary | ICD-10-CM | POA: Diagnosis not present

## 2024-05-21 DIAGNOSIS — I161 Hypertensive emergency: Secondary | ICD-10-CM | POA: Diagnosis not present

## 2024-05-21 DIAGNOSIS — E1143 Type 2 diabetes mellitus with diabetic autonomic (poly)neuropathy: Secondary | ICD-10-CM | POA: Diagnosis not present

## 2024-05-21 DIAGNOSIS — Z9911 Dependence on respirator [ventilator] status: Secondary | ICD-10-CM | POA: Diagnosis not present

## 2024-05-21 DIAGNOSIS — I611 Nontraumatic intracerebral hemorrhage in hemisphere, cortical: Secondary | ICD-10-CM | POA: Diagnosis not present

## 2024-05-21 DIAGNOSIS — Z515 Encounter for palliative care: Secondary | ICD-10-CM | POA: Diagnosis not present

## 2024-05-21 DIAGNOSIS — I615 Nontraumatic intracerebral hemorrhage, intraventricular: Secondary | ICD-10-CM | POA: Diagnosis not present

## 2024-05-21 DIAGNOSIS — I610 Nontraumatic intracerebral hemorrhage in hemisphere, subcortical: Secondary | ICD-10-CM | POA: Diagnosis not present

## 2024-05-21 HISTORY — PX: IR GJ TUBE CHANGE: IMG1440

## 2024-05-21 LAB — RENAL FUNCTION PANEL
Albumin: 2.7 g/dL — ABNORMAL LOW (ref 3.5–5.0)
Anion gap: 15 (ref 5–15)
BUN: 21 mg/dL — ABNORMAL HIGH (ref 6–20)
CO2: 26 mmol/L (ref 22–32)
Calcium: 9.4 mg/dL (ref 8.9–10.3)
Chloride: 95 mmol/L — ABNORMAL LOW (ref 98–111)
Creatinine, Ser: 4.76 mg/dL — ABNORMAL HIGH (ref 0.44–1.00)
GFR, Estimated: 11 mL/min — ABNORMAL LOW (ref >60)
Glucose, Bld: 117 mg/dL — ABNORMAL HIGH (ref 70–99)
Phosphorus: 6.4 mg/dL — ABNORMAL HIGH (ref 2.5–4.6)
Potassium: 4.5 mmol/L (ref 3.5–5.1)
Sodium: 136 mmol/L (ref 135–145)

## 2024-05-21 LAB — GLUCOSE, CAPILLARY
Glucose-Capillary: 110 mg/dL — ABNORMAL HIGH (ref 70–99)
Glucose-Capillary: 111 mg/dL — ABNORMAL HIGH (ref 70–99)
Glucose-Capillary: 121 mg/dL — ABNORMAL HIGH (ref 70–99)
Glucose-Capillary: 126 mg/dL — ABNORMAL HIGH (ref 70–99)
Glucose-Capillary: 138 mg/dL — ABNORMAL HIGH (ref 70–99)
Glucose-Capillary: 149 mg/dL — ABNORMAL HIGH (ref 70–99)

## 2024-05-21 LAB — HEPATITIS B SURFACE ANTIBODY, QUANTITATIVE: Hep B S AB Quant (Post): 133 m[IU]/mL

## 2024-05-21 LAB — CBC
HCT: 27.2 % — ABNORMAL LOW (ref 36.0–46.0)
Hemoglobin: 8.5 g/dL — ABNORMAL LOW (ref 12.0–15.0)
MCH: 31.3 pg (ref 26.0–34.0)
MCHC: 31.3 g/dL (ref 30.0–36.0)
MCV: 100 fL (ref 80.0–100.0)
Platelets: 101 K/uL — ABNORMAL LOW (ref 150–400)
RBC: 2.72 MIL/uL — ABNORMAL LOW (ref 3.87–5.11)
RDW: 14.8 % (ref 11.5–15.5)
WBC: 6.5 K/uL (ref 4.0–10.5)
nRBC: 0 % (ref 0.0–0.2)

## 2024-05-21 MED ORDER — SENNA 8.6 MG PO TABS
1.0000 | ORAL_TABLET | Freq: Every day | ORAL | Status: DC
  Administered 2024-05-21: 8.6 mg
  Filled 2024-05-21: qty 1

## 2024-05-21 MED ORDER — OSMOLITE 1.5 CAL PO LIQD
1000.0000 mL | ORAL | Status: DC
  Administered 2024-05-21 – 2024-05-24 (×4): 1000 mL

## 2024-05-21 MED ORDER — LIDOCAINE VISCOUS HCL 2 % MT SOLN
15.0000 mL | OROMUCOSAL | Status: AC
  Filled 2024-05-21: qty 15

## 2024-05-21 MED ORDER — CYCLOSPORINE 100 MG/ML PO SOLN
200.0000 mg | Freq: Every evening | ORAL | Status: DC
  Filled 2024-05-21: qty 2

## 2024-05-21 MED ORDER — LABETALOL HCL 5 MG/ML IV SOLN
10.0000 mg | INTRAVENOUS | Status: DC | PRN
  Administered 2024-05-21 – 2024-05-23 (×4): 10 mg via INTRAVENOUS
  Filled 2024-05-21 (×4): qty 4

## 2024-05-21 MED ORDER — IOHEXOL 300 MG/ML  SOLN: 50.0000 mL | Freq: Once | INTRAMUSCULAR | Status: DC | PRN

## 2024-05-21 MED ORDER — POLYETHYLENE GLYCOL 3350 17 G PO PACK
17.0000 g | PACK | Freq: Every day | ORAL | Status: DC
  Administered 2024-05-21: 17 g
  Filled 2024-05-21: qty 1

## 2024-05-21 MED ORDER — CYCLOSPORINE MODIFIED (GENGRAF) 100 MG/ML PO SOLN
200.0000 mg | Freq: Every day | ORAL | Status: DC
Start: 2024-05-21 — End: 2024-05-21
  Filled 2024-05-21: qty 2

## 2024-05-21 MED ORDER — CYCLOSPORINE MODIFIED (NEORAL) 100 MG/ML PO SOLN
200.0000 mg | Freq: Every evening | ORAL | Status: DC
  Administered 2024-05-21: 200 mg
  Filled 2024-05-21 (×3): qty 2

## 2024-05-21 MED ORDER — CYCLOSPORINE MODIFIED (NEORAL) 100 MG/ML PO SOLN
225.0000 mg | Freq: Every day | ORAL | Status: DC
  Administered 2024-05-22: 225 mg
  Filled 2024-05-21: qty 2.3

## 2024-05-21 MED ORDER — HEPARIN SODIUM (PORCINE) 5000 UNIT/ML IJ SOLN
5000.0000 [IU] | Freq: Three times a day (TID) | INTRAMUSCULAR | Status: DC
  Administered 2024-05-21: 5000 [IU] via SUBCUTANEOUS
  Filled 2024-05-21: qty 1

## 2024-05-21 MED ORDER — MIDAZOLAM HCL 2 MG/2ML IJ SOLN
INTRAMUSCULAR | Status: AC
  Filled 2024-05-21: qty 2

## 2024-05-21 MED ORDER — CYCLOSPORINE MODIFIED (GENGRAF) 100 MG/ML PO SOLN: 225.0000 mg | Freq: Every day | ORAL | Status: DC

## 2024-05-21 MED ORDER — MIDAZOLAM HCL 2 MG/2ML IJ SOLN
1.0000 mg | Freq: Once | INTRAMUSCULAR | Status: AC
  Administered 2024-05-21: 1 mg via INTRAVENOUS

## 2024-05-21 MED ORDER — SENNOSIDES 8.8 MG/5ML PO SYRP
5.0000 mL | ORAL_SOLUTION | Freq: Every day | ORAL | Status: DC
Start: 2024-05-22 — End: 2024-05-22
  Filled 2024-05-21: qty 5

## 2024-05-21 MED ORDER — CARVEDILOL 12.5 MG PO TABS
25.0000 mg | ORAL_TABLET | Freq: Two times a day (BID) | ORAL | Status: DC
Start: 2024-05-21 — End: 2024-05-25
  Administered 2024-05-21 – 2024-05-25 (×8): 25 mg
  Filled 2024-05-21 (×8): qty 2

## 2024-05-21 MED ORDER — HEPARIN SODIUM (PORCINE) 5000 UNIT/ML IJ SOLN
5000.0000 [IU] | Freq: Two times a day (BID) | INTRAMUSCULAR | Status: DC
  Administered 2024-05-21 – 2024-06-17 (×54): 5000 [IU] via SUBCUTANEOUS
  Filled 2024-05-21 (×54): qty 1

## 2024-05-21 MED ORDER — CLONIDINE HCL 0.2 MG PO TABS
0.3000 mg | ORAL_TABLET | Freq: Three times a day (TID) | ORAL | Status: DC
  Administered 2024-05-21 – 2024-05-26 (×17): 0.3 mg
  Filled 2024-05-21 (×18): qty 1

## 2024-05-21 MED ORDER — PROSOURCE TF20 ENFIT COMPATIBL EN LIQD
60.0000 mL | Freq: Every day | ENTERAL | Status: DC
  Administered 2024-05-21 – 2024-05-26 (×6): 60 mL
  Filled 2024-05-21 (×5): qty 60

## 2024-05-21 MED ORDER — LIDOCAINE VISCOUS HCL 2 % MT SOLN
OROMUCOSAL | Status: AC
  Filled 2024-05-21: qty 15

## 2024-05-21 MED ORDER — SENNOSIDES 8.8 MG/5ML PO SYRP
5.0000 mL | ORAL_SOLUTION | Freq: Every day | ORAL | Status: DC
  Filled 2024-05-21: qty 5

## 2024-05-21 NOTE — Progress Notes (Signed)
 Pt transported to and from IR without event.  RT with pt for ~1 hour in IR for procedure.

## 2024-05-21 NOTE — Progress Notes (Signed)
 Daily Progress Note   Patient Name: [name] [name]       Date: 05/21/2024 DOB: 1981-08-04  Age: 43 y.o. MRN#: [id number] Attending Physician: Harold Scholz, MD Primary Care Physician: [facility]mit Date: 05/19/2024  Reason for Consultation/Follow-up: Establishing goals of care  Patient Profile/HPI:    43 y.o. female  with past medical history of ESRD on HD, failed renal transplant, pancreatic transplant, DM1, gastroparesis recent admission 6/11-7/28 for ICH with d/c to [facility] and eventual d/c to [facility] on 8/13, admitted on 05/19/2024 from dialysis with code stroke. Workup revealed recurrent large intraparenchymal hemorrhage. EVD placed by neurosurgery. Palliative consulted for medical decision making.   Surrogate decision maker- HCPOA document on chart indicates Nakema Bar to be surrogate decision maker however Nori has been very difficult to reach and did not come for meeting today. I attempted to call her and received no return call. Patient's mother has been constantly at bedside. Per Palliative NP Camellia Kays- when [facility] document was completed it was due to patient's mother being in [place]. Because mother is here and readily available, and because Nori has been neglectful in duties of HCPOA- not returning phone calls, not showing for planned GOC discussion- then it is appropriate to defer decision making to patient's mother   Subjective:  Chart reviewed including labs, progress notes, imaging from this and previous encounters.  Patient having periods of apnea when weaning. Chest xray from yesterday shows no lung infiltrates or opacities. Cr elevated 4.76- plan for HD today per nephrology note. Hgb a little low 8.5. WBC WNL. Platelets low at 101.   Precedex  stopped this morning.  Met with patient's mother at bedside.  Prior to admission patient was living at [facility] and was working hard to gain increase in function. She would tell her Mom that she is going to make it back to where I was. Her Mom shares that Clayborne is very strong and has been through many difficult times.  Angel loved to do everything. She loved studying, she loved traveling.  We reviewed the difficult situation Clayborne is in given her recurrent hemorrhage. We discussed her current acute issues and her chronic illness.  Angel's mom is devastated by this new recurrence. Emotional support provided.  She shared her understanding that if [facility] isn't able to be liberated  from ventilator then a tracheostomy will be considered. I encouraged her to consider if this would be an intervention that Clayborne would be happy with. We discussed considering if Clayborne would want to live dependent on a ventilator if she wasn't able to wake up and interact with her friends and family.  Angel's mom shares her faith and belief that Angel's healing is in God's hands.    Review of Systems  Unable to perform ROS: Mental status change     Physical Exam Vitals and nursing note reviewed.  Constitutional:      Appearance: She is ill-appearing.  Cardiovascular:     Rate and Rhythm: Normal rate.  Pulmonary:     Comments: intubated Skin:    Coloration: Skin is pale.  Neurological:     Comments: unresponsive             Vital Signs: BP (!) 119/57   Pulse 100   Temp 100.2 F (37.9 C) (Axillary)   Resp 13   Ht 5' 3 (1.6 m)   Wt 50.4 kg   SpO2 99%   BMI 19.68 kg/m  SpO2: SpO2: 99 % O2 Device: O2 Device: Ventilator O2 Flow Rate:    Intake/output summary:  Intake/Output Summary (Last 24 hours) at 05/21/2024 1707 Last data filed at 05/21/2024 1700 Gross per 24 hour  Intake 1221.3 ml  Output 193 ml  Net 1028.3 ml   LBM: Last BM Date : 05/19/24 Baseline Weight: Weight: 54.6 kg Most  recent weight: Weight: 50.4 kg       Palliative Assessment/Data: PPS: 10% (with OG feeding)      Patient Active Problem List   Diagnosis Date Noted   ICH (intracerebral hemorrhage) (HCC) 05/19/2024   Pontine hemorrhage (HCC) 02/15/2024   Malnutrition of moderate degree 02/15/2024   SAH (subarachnoid hemorrhage) (HCC) 02/15/2024   Colitis 02/14/2024   History of diabetic gastroparesis 02/14/2024   History of pancreatic islet cell transplantation 02/14/2024   Acute metabolic encephalopathy 02/14/2024   Prolonged QT interval 02/14/2024   Controlled type 2 diabetes mellitus with complication, without long-term current use of insulin  (HCC) 02/14/2024   Sepsis (HCC) 02/14/2024   Chronic, continuous use of opioids 11/12/2023   Need for acute hemodialysis due to missed session(HCC) 11/12/2023   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/12/2023   Vaginal bleeding 06/13/2023   ESRD needing dialysis (HCC) 03/07/2022   Chronic pain 03/07/2022   ESRD (end stage renal disease) (HCC)    Hypertensive emergency 01/31/2022   Hypertensive crisis 01/30/2022   Hypervolemia associated with renal insufficiency 12/23/2021   Hemorrhagic gastritis    Subdural hematoma (HCC) 12/02/2021   Acute blood loss anemia 12/02/2021   Chronic abdominal pain 12/02/2021   Thrombocytopenia (HCC) 12/02/2021   Abnormal CT of the abdomen 12/02/2021   Type 1 diabetes (HCC) 12/02/2021   Fever 11/07/2021   Acute encephalopathy 11/06/2021   Pulmonary edema 11/06/2021   Hypertensive urgency 11/06/2021   Type 2 diabetes mellitus with peripheral neuropathy (HCC) 11/06/2021   History of renal transplant 11/06/2021   COVID-19 virus infection 11/06/2021   HTN (hypertension), malignant 08/02/2021   ESRD on dialysis (HCC) 02/25/2021   Coagulation defect, unspecified (HCC) 12/22/2020   Acute cough 10/04/2020   Allergy, unspecified, initial encounter 05/21/2020   Anaphylactic shock, unspecified,  initial encounter 05/21/2020   Pancytopenia (HCC) 02/27/2020   Shortness of breath 04/23/2019   Hypocalcemia 12/30/2018   Aortic atherosclerosis (HCC) 12/27/2018   Acute respiratory failure with hypoxia (HCC)  12/22/2018   Hyperkalemia    Bilateral pneumonia 12/21/2018   Pancreas transplant status (HCC) 12/21/2018   Immunosuppression (HCC) 12/21/2018   Chest pain 12/21/2018   CAP (community acquired pneumonia) 12/20/2018   Other staphylococcus as the cause of diseases classified elsewhere 08/16/2018   Mild protein-calorie malnutrition (HCC) 04/18/2018   Encounter for screening for respiratory tuberculosis 04/06/2018   Gout 04/06/2018   Iron  deficiency anemia, unspecified 04/06/2018   Leiomyoma of uterus, unspecified 04/06/2018   Secondary hyperparathyroidism of renal origin (HCC) 04/06/2018   Acute rejection of kidney transplant 07/11/2017   Elevated serum creatinine 06/27/2017   E. coli UTI 12/18/2014   Transplant rejection 09/03/2014   Cyst of ovary, left 09/01/2014   Immunosuppressive management encounter following kidney transplant 08/26/2014   Acne 04/28/2014   Itch of skin 04/28/2014   Diabetic gastroparesis associated with type 1 diabetes mellitus (HCC) 11/07/2013   Nausea and vomiting 11/07/2013   Lesion of spleen 10/07/2013   Metabolic acidosis 09/25/2013   Patient's noncompliance with other medical treatment and regimen 09/12/2013   Narcotic dependence (HCC) 08/31/2013   Generalized pain 08/30/2013   Hypomagnesemia 08/30/2013   Failed kidney transplant 08/21/2013   Hypophosphatemia 07/28/2013   End stage renal disease (HCC) 02/06/2012   Pleural effusion, bilateral 11/16/2011   Anemia in chronic kidney disease (CKD) 11/16/2011   Pericardial effusion 11/15/2011   Fluid overload 11/15/2011   Abdominal pain, epigastric 11/15/2011   CKD (chronic kidney disease) stage 5, GFR less than 15 ml/min (HCC) 11/03/2011   Hypertension 10/27/2011   Renal failure, chronic  10/27/2011   MEDIAL EPICONDYLITIS, LEFT 12/17/2007   ABSCESS, AXILLA, LEFT 12/03/2007   Type 2 diabetes mellitus with other diabetic kidney complication (HCC) 11/05/2007    Palliative Care Assessment & Plan    Assessment/Recommendations/Plan  Large parenchymal hemorrhage with extension into ventricles- s/p EVD placement, intubated Continue full scope, full care PMT will continue to follow   Code Status:   Code Status: Full Code   Prognosis:  Unable to determine  Discharge Planning: To Be Determined  Care plan was discussed with patient's mother  Thank you for allowing the Palliative Medicine Team to assist in the care of this patient.  Total time:  Prolonged billing:  Time includes:   Preparing to see the patient (e.g., review of tests) Obtaining and/or reviewing separately obtained history Performing a medically necessary appropriate examination and/or evaluation Counseling and educating the patient/family/caregiver Ordering medications, tests, or procedures Referring and communicating with other health care professionals (when not reported separately) Documenting clinical information in the electronic or other health record Independently interpreting results (not reported separately) and communicating results to the patient/family/caregiver Care coordination (not reported separately) Clinical documentation  Cassondra Stain, AGNP-C Palliative Medicine   Please contact Palliative Medicine Team phone at [phone] for questions and concerns.

## 2024-05-21 NOTE — Progress Notes (Signed)
 Providing Compassionate, Quality Care - Together   Subjective: Nurse reports no new issues. The patient's mother is at the bedside.  Objective: Vital signs in last 24 hours: Temp:  [97.7 F (36.5 C)-100.4 F (38 C)] 100.4 F (38 C) (09/17 1200) Pulse Rate:  [83-108] 101 (09/17 1500) Resp:  [12-25] 13 (09/17 1500) BP: (111-161)/(48-75) 123/66 (09/17 1541) SpO2:  [100 %] 100 % (09/17 1500) Arterial Line BP: (102-167)/(43-72) 155/45 (09/17 1415) FiO2 (%):  [40 %] 40 % (09/17 1541) Weight:  [50.4 kg] 50.4 kg (09/17 0701)  Intake/Output from previous day: 09/16 0701 - 09/17 0700 In: 1299.4 [I.V.:999.2; IV Piggyback:300.2] Out: 158 [Drains:158] Intake/Output this shift: Total I/O In: 484.3 [I.V.:362.6; NG/GT:86.7; IV Piggyback:35] Out: 66 [Drains:66]  Responds to pain PERRLA, 2 round sluggish Withdraws to pain, unable to follow commands EVD in place, draining serosanguinous spinal fluid Site is clean, dry, and intact  Lab Results: Recent Labs    05/20/24 0456 05/20/24 1028 05/21/24 0543  WBC 5.1  --  6.5  HGB 9.8* 9.2* 8.5*  HCT 30.5* 27.0* 27.2*  PLT 119*  --  101*   BMET Recent Labs    05/20/24 0456 05/20/24 1028 05/21/24 0543  NA 138 138 136  K 2.7* 3.6 4.5  CL 95*  --  95*  CO2 31  --  26  GLUCOSE 126*  --  117*  BUN 14  --  21*  CREATININE 3.55*  --  4.76*  CALCIUM  9.7  --  9.4    Studies/Results: IR GJ Tube Change Result Date: 05/21/2024 INDICATION: Catheter malfunctioning. Feeding tube dependent, altered mental status. EXAM: GASTROJEJUNOSTOMY TUBE EXCHANGE COMPARISON:  IR FLUOROSCOPY, 02/19/2024 and 04/15/2024. KUB, 04/01/2024. MEDICATIONS: None. CONTRAST:  100 mL Omnipaque  300 ANESTHESIA/SEDATION: Local anesthetic and single agent sedation was employed during this procedure. A total of Versed  1.0 mg was administered intravenously. The patient's level of consciousness and vital signs were monitored continuously by radiology nursing throughout the  procedure under my direct supervision. FLUOROSCOPY: Radiation Exposure Index and estimated peak skin dose (PSD); Reference air kerma (RAK), 73.3 mGy. COMPLICATIONS: None. PROCEDURE: Informed written consent was obtained from the patient and/or patient's representative after a discussion of the risks and benefits. The upper abdomen and the external portion of the existing gastrojejunostomy tube was prepped and draped in the usual sterile fashion, and a sterile drape was applied covering the operative field. Maximum barrier sterile technique with sterile gowns and gloves were used for the procedure. A timeout was performed prior to the initiation of the procedure. The existing 18 Fr small bore gastrojejunostomy had refluxed into the stomach. The feeding tube was cannulated with a stiff Glidewire then removed, a 5 Fr Kumpe catheter was then manipulated into the proximal aspect of the small bowel. Contrast injection confirmed appropriate positioning. Under intermittent fluoroscopic guidance, a new 18 Fr large-bore gastrojejunostomy catheter was advanced with tip ultimately terminating within the proximal small bowel. Contrast injection via the jejunostomy and gastric lumens confirmed appropriate functioning and positioning. A dressing was placed. The patient tolerated procedure well without immediate postprocedural complication. IMPRESSION: Successful fluoroscopic guided exchange of a refluxed, small bore 18 Fr tube for a new large bore 18 Fr feeding tube. The tip of the jejunostomy portion of the catheter lies within the proximal jejunum. Both lumens ready for immediate use. RECOMMENDATIONS: The patient will return to Vascular Interventional Radiology (VIR) for routine feeding tube evaluation and exchange in 6 months. Thom Hall, MD [facility]  Radiology Electronically Signed   By: Thom Hall M.D.   On: 05/21/2024 15:37   EEG adult Result Date: 05/20/2024 Shelton Arlin KIDD, MD     05/20/2024  3:26 PM Patient Name: [name] [name] MRN: [id number] Epilepsy Attending: Arlin KIDD Shelton Referring Physician/Provider: Remi Pippin, NP Date: 05/20/2024 Duration: 29.32 mins Patient history: 43yo F with right temporal ICH. EEG to evaluate for seizure Level of alertness: comatose/ lethargic AEDs during EEG study: LCM, Ativan  Technical aspects: This EEG study was done with scalp electrodes positioned according to the 10-20 International system of electrode placement. Electrical activity was reviewed with band pass filter of 1-70Hz , sensitivity of 7 uV/mm, display speed of 34mm/sec with a 60Hz  notched filter applied as appropriate. EEG data were recorded continuously and digitally stored.  Video monitoring was available and reviewed as appropriate. Description: EEG showed continuous generalized and lateralized right hemisphere 3 to 5 Hz theta-delta slowing admixed with 13-15hz  beta activity. Generalized periodic discharges with triphasic morphology were also noted at 1 Hz.  Hyperventilation and photic stimulation were not performed.   ABNORMALITY - Periodic discharges with triphasic morphology, generalized ( GPDs) - Continuous slow, generalized and lateralized right hemisphere IMPRESSION: This study showed generalized periodic discharges with triphasic morphology. This EEG pattern can be seen due to toxic-metabolic causes. However, due to patient's complicated history, can also be on the ictal-interictal continuum but with low suspicion for seizure recurrence. Additionally there is cortical dysfunction arising from right hemisphere likely secondary to underlying structural abnormality. Additionally there is moderate to severe diffuse encephalopathy. No seizures were seen throughout the recording. Arlin KIDD Shelton   DG CHEST PORT 1 VIEW Result Date: 05/20/2024 CLINICAL DATA:  Central line placement. EXAM: PORTABLE CHEST 1 VIEW COMPARISON:  May 19, 2024. FINDINGS: Stable  cardiomegaly. Endotracheal and nasogastric tubes are unchanged. Interval placement of left subclavian catheter with distal tip in expected position of cavoatrial junction. No pneumothorax is noted. Both lungs are clear. The visualized skeletal structures are unremarkable. IMPRESSION: Interval placement of left subclavian catheter with distal tip in expected position of cavoatrial junction. Electronically Signed   By: Lynwood Landy Raddle M.D.   On: 05/20/2024 14:52   MR ANGIO HEAD WO CONTRAST Result Date: 05/20/2024 EXAM: MR Angiography Head without intravenous Contrast. 05/20/2024 11:57:32 AM TECHNIQUE: Magnetic resonance angiography images of the head without intravenous contrast. Multiplanar 2D and 3D reformatted images are provided for review. COMPARISON: MRI of the head dated 05/20/2024 and CT angiogram of the head and neck dated 02/21/2024. CLINICAL HISTORY: Stroke, hemorrhagic. FINDINGS: The study is degraded by motion. ANTERIOR CIRCULATION: The cranial and cavernous segments of the internal carotid arteries demonstrate no significant stenosis. The proximal segments of the anterior and middle cerebral arteries are normal in caliber bilaterally. The distal branches are suboptimally assessed secondary to motion. No aneurysm. POSTERIOR CIRCULATION: The proximal segments of the posterior cerebral arteries are normal in caliber bilaterally. The distal branches are suboptimally assessed secondary to motion. No significant stenosis of the basilar artery. No significant stenosis of the vertebral arteries. No aneurysm. IMPRESSION: 1. No significant stenosis of the intracranial vasculature. 2. Study degraded by motion, limiting assessment of distal branches. Electronically signed by: Evalene Coho MD 05/20/2024 12:32 PM EDT RP Workstation: [id number]   MR BRAIN WO CONTRAST Result Date: 05/20/2024 CLINICAL DATA:  Hemorrhagic stroke EXAM: MRI HEAD WITHOUT CONTRAST TECHNIQUE: Multiplanar, multiecho pulse sequences of  the brain and surrounding structures were obtained without intravenous contrast. COMPARISON:  March 10, 2024 MRI, May 19, 2024 CT FINDINGS: MRI brain: There is an old infarct in the left parietal lobe with encephalomalacia and areas of magnetic susceptibility. There are old lacunar infarcts involving both sides of the pons. There has an old infarct in the left inferior cerebellum with encephalomalacia. There is intraventricular hemorrhage involving the right more than the left lateral ventricle with ventriculomegaly. A left frontal ventriculostomy tube is in place. There is hemorrhage along the ventriculostomy tract. The ventricles are enlarged similar to the CT from yesterday. No acute ischemic infarct is identified. There are normal flow signals in the carotid arteries and basilar artery. No significant bone marrow signal abnormality. There are bilateral middle ear and mastoid effusions. IMPRESSION: 1. Intraventricular hemorrhage with ventriculomegaly and left frontal ventriculostomy tube in place 2. No acute ischemic infarct 3. 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 Electronically Signed   By: Nancyann Burns M.D.   On: 05/20/2024 12:05   CT HEAD POST STROKE FOLLOWUP/TIMED/STAT READ Result Date: 05/19/2024 CLINICAL DATA:  Code stroke.  Neuro deficit, acute, stroke suspected EXAM: CT HEAD WITHOUT CONTRAST TECHNIQUE: Contiguous axial images were obtained from the base of the skull through the vertex without intravenous contrast. RADIATION DOSE REDUCTION: This exam was performed according to the departmental dose-optimization program which includes automated exposure control, adjustment of the mA and/or kV according to patient size and/or use of iterative reconstruction technique. COMPARISON:  None Available. FINDINGS: Brain: No significant change and large volume of acute intraventricular hemorrhage and moderate hydrocephalus. Similar  intraparenchymal hemorrhage in the right hippocampal region that is inseparable from the intraventricular hemorrhage. Similar small clustered areas of hyperdensity in the posterior left cerebellum which could represent calcification and/or hemorrhage. Similar involving left parietal infarct and chronic left cerebellar infarcts. No evidence of new/interval acute large vascular territory infarct. Vascular: No hyperdense vessel. Skull: Normal. Negative for fracture or focal lesion. Sinuses/Orbits: Clear sinuses.  No acute orbital findings. IMPRESSION: 1. No significant change in large volume of acute intraventricular hemorrhage and moderate hydrocephalus. 2. Similar intraparenchymal hemorrhage in the right hippocampal region that is inseparable from the intraventricular hemorrhage. 3. Similar small clustered areas of hyperdensity in the posterior left cerebellum which could represent calcification and/or hemorrhage. 4. Similar evolving left parietal infarct and chronic left cerebellar infarcts. Electronically Signed   By: Gilmore GORMAN Molt M.D.   On: 05/19/2024 22:51   DG CHEST PORT 1 VIEW Result Date: 05/19/2024 EXAM: 1 VIEW XRAY OF THE CHEST 05/19/2024 09:07:00 PM COMPARISON: 03/29/2024 CLINICAL HISTORY: Airway intubation performed without difficulty. Encounter for orogastric (OG) tube placement. FINDINGS: LINES, TUBES AND DEVICES: Endotracheal tube in place with tip 3.2 cm above the carina. Enteric tube in place with tip in proximal stomach and sidehole at level of distal esophagus. Advancement of the catheter by 10 to 15 cm with more optimally positioned device. LUNGS AND PLEURA: No focal pulmonary opacity. No pulmonary edema. No pleural effusion. No pneumothorax. HEART AND MEDIASTINUM: Mild cardiomegaly. No acute abnormality of the cardiac and mediastinal silhouettes. BONES AND SOFT TISSUES: No acute osseous abnormality. IMPRESSION: 1. Endotracheal tube in place with tip 3.2 cm above the carina. 2. Enteric tube  in place with tip in proximal stomach and sidehole at level of distal esophagus. Advancement of the catheter by 10-15 cm is recommended for more optimal positioning. 3. Mild cardiomegaly. Electronically signed by: Dorethia Molt MD 05/19/2024 09:14 PM EDT RP Workstation: [id number]   CT HEAD CODE STROKE WO CONTRAST Result Date: 05/19/2024 CLINICAL  DATA:  Code stroke. Neuro deficit, acute, stroke suspected. EXAM: CT HEAD WITHOUT CONTRAST TECHNIQUE: Contiguous axial images were obtained from the base of the skull through the vertex without intravenous contrast. RADIATION DOSE REDUCTION: This exam was performed according to the departmental dose-optimization program which includes automated exposure control, adjustment of the mA and/or kV according to patient size and/or use of iterative reconstruction technique. COMPARISON:  Head CT and MRI 03/10/2024 FINDINGS: Brain: There is new large volume acute hemorrhage filling the majority of the right lateral ventricle and smaller volume hemorrhage in the left lateral ventricle. There is increased, moderate dilatation of the lateral, third, and fourth ventricles. There is acute parenchymal hemorrhage in the medial right temporal lobe/hippocampus which is inseparable from the intraventricular hemorrhage. Small clustered hyperdense foci in the posterior aspect of the left cerebellar hemisphere may reflect residual/recurrent hemorrhage and/or mineralization. The left parietal infarct has evolved and demonstrates mild encephalomalacia with resolution of gyral hyperdensity on the prior CT. A chronic infarct in the medial left cerebellar hemisphere is unchanged. No definite acute infarct, midline shift, or extra-axial fluid collection is identified. Vascular: Calcified atherosclerosis at the skull base. No hyperdense vessel. Skull: Right frontal burr hole. Sinuses/Orbits: Suspected mucous retention cyst in the left maxillary sinus. Bilateral mastoid effusions. Bilateral cataract  extraction. Other: None. ASPECTS [facility] Stroke Program Early CT Score) Not scored in the presence of acute hemorrhage. These results were communicated to Dr. Arora at 4:30 pm on 05/19/2024 by text page via the [facility] messaging system. IMPRESSION: 1. Large volume acute intraventricular hemorrhage with moderate hydrocephalus. 2. Acute hemorrhage in the medial right temporal lobe, inseparable from the intraventricular hemorrhage. 3. Small foci of residual/recurrent hemorrhage and/or mineralization in the left cerebellar hemisphere. Electronically Signed   By: Dasie Hamburg M.D.   On: 05/19/2024 16:32    Assessment/Plan: Patient suffered a repeat intracerebral hemorrhage. Ventriculostomy placed on 05/19/2024.   LOS: 2 days   -Continue supportive efforts  I am in communication with my attending and they agree with the plan for this patient.   Gerard Beck, DNP, AGNP-C Nurse Practitioner  [facility] [address], [place], [name] [address] P: [phone]    F: [phone]  05/21/2024, 3:48 PM

## 2024-05-21 NOTE — Progress Notes (Signed)
 [facility] Progress Note  Subjective:    Vitals:   05/21/24 1300 05/21/24 1315 05/21/24 1330 05/21/24 1345  BP: 136/74     Pulse: (!) 108 (!) 106 (!) 103 (!) 103  Resp: 19 (!) 21 19 16   Temp:      TempSrc:      SpO2: 100% 100% 100% 100%  Weight:        Exam: Gen on vent, sedated Sclera anicteric, throat w/ ETT No jvd or bruits Chest clear anterior/ lateral RRR no MRG Abd soft ntnd no mass or ascites +bs GU defer Ext no LE or UE edema Neuro is on vent, sedated   RUE AVF+bruit    Home bp meds: Norvasc  10 every day Catapres  patch 0.3mg  weekly Hydralazine  75mg  tid Metoprolol  50mg  bid    OP HD: NW MWF 3h  B400  55.2kg  2K bath   AVF  Heparin  none Last OP HD 9/15, about 2h 50 min then LOC Mircera 150 mcg q 2 wks, last 9/10, due 9/24       Assessment/ Plan: Acute R IC bleed: d/t uncont HTN. On vent, per neuro/ CCM.  ESRD: on HD MWF. HD today/ tonight.  HTN: SP IV clevidipine , transitioned to home meds per NG Volume: several kg under dry wt, will need lower dry wt at dc. Euvolemic on exam. 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  VDRF: per CCM H/o pancreas/ renal transplant: renal transplant failed, pancreas still working, getting cellcept  and prednisone . Should be getting CyA as well, will ask pharmacy.     Myer Fret MD  [facility] 05/21/2024, 2:05 PM  Recent Labs  Lab 05/19/24 1610 05/19/24 1615 05/20/24 0456 05/20/24 1028 05/21/24 0543  HGB 12.2   < > 9.8* 9.2* 8.5*  ALBUMIN 3.6  --   --   --  2.7*  CALCIUM  9.3  --  9.7  --  9.4  PHOS  --   --  2.4*  --  6.4*  CREATININE 2.80*   < > 3.55*  --  4.76*  K 3.1*   < > 2.7* 3.6 4.5   < > = values in this interval not displayed.   No results for input(s): IRON , TIBC, FERRITIN in the last 168 hours. Inpatient medications:   stroke: early stages of recovery book   Does not apply Once   amLODipine   10 mg Per Tube Daily   carvedilol   25 mg Per Tube BID WC    Chlorhexidine  Gluconate Cloth  6 each Topical 1 day or 1 dose   Chlorhexidine  Gluconate Cloth  6 each Topical Q0600   cloNIDine   0.3 mg Per Tube TID   feeding supplement (PROSource TF20)  60 mL Per Tube Daily   heparin  injection (subcutaneous)  5,000 Units Subcutaneous Q12H   insulin  aspart  0-9 Units Subcutaneous Q4H   lidocaine   15 mL Mouth/Throat NOW   mycophenolate   500 mg Per Tube BID   mouth rinse  15 mL Mouth Rinse Q2H   pantoprazole  (PROTONIX ) IV  40 mg Intravenous QHS   polyethylene glycol  17 g Per Tube Daily   predniSONE   15 mg Per Tube Q breakfast   [START ON 05/22/2024] sennosides  5 mL Per Tube Daily    clevidipine  Stopped (05/21/24 1203)   dexmedetomidine  (PRECEDEX ) IV infusion Stopped (05/21/24 1008)   feeding supplement (OSMOLITE 1.5 CAL) 20 mL/hr at 05/21/24 1300   lacosamide  (VIMPAT ) IV Stopped (05/21/24 1042)  acetaminophen  **OR** acetaminophen  (TYLENOL ) oral liquid 160 mg/5 mL **OR** acetaminophen , docusate sodium , iohexol , iohexol , labetalol , mouth rinse

## 2024-05-21 NOTE — Procedures (Signed)
 Vascular and Interventional Radiology Procedure Note  Patient: [name] [name] DOB: 1980/10/21 Medical Record Number: [id number] Note Date/Time: 05/21/24 10:48 AM   Performing Physician: Thom Hall, MD Assistant(s): None  Diagnosis: Catheter malfunction.  Procedure: GASTROJEJUNOSTOMY TUBE EXCHANGE  Anesthesia: Local Anesthetic Complications: None Estimated Blood Loss: Minimal  Findings:  Refluxed small bore GJ, with tip within the stomach Successful exchange for a large a 56F gastrojejunostomy tube under fluoroscopy.   See detailed procedure note with images in PACS. The patient tolerated the procedure well without incident or complication and was returned to ICU in stable condition.    Thom Hall, MD Vascular [facility] Interventional Radiology [facility] Radiology   Pager. [phone] Clinic. [phone]

## 2024-05-21 NOTE — Progress Notes (Signed)
 PT Cancellation Note  Patient Details Name: [name] [name] MRN: [id number] DOB: [birth year]   Cancelled Treatment:    Reason Eval/Treat Not Completed: Medical issues which prohibited therapy. Intubated and sedated. PT will follow up when pt is able to wean sedation and participate in PT evaluation.   Bernardino JINNY Ruth 05/21/2024, 9:43 AM

## 2024-05-21 NOTE — Progress Notes (Addendum)
 PT OT.  STROKE TEAM PROGRESS NOTE    SIGNIFICANT HOSPITAL EVENTS 9/15- Admitted with large ICH, IVH   INTERIM HISTORY/SUBJECTIVE Intubated, Precedex  infusing. Pt. Sedated after GJ placement.  Palliative care consult placed by CCM.  GJ tube replaced by IR 9/17   OBJECTIVE  CBC    Component Value Date/Time   WBC 6.5 05/21/2024 0543   RBC 2.72 (L) 05/21/2024 0543   HGB 8.5 (L) 05/21/2024 0543   HCT 27.2 (L) 05/21/2024 0543   PLT 101 (L) 05/21/2024 0543   MCV 100.0 05/21/2024 0543   MCH 31.3 05/21/2024 0543   MCHC 31.3 05/21/2024 0543   RDW 14.8 05/21/2024 0543   LYMPHSABS 0.4 (L) 05/19/2024 1610   MONOABS 0.2 05/19/2024 1610   EOSABS 0.0 05/19/2024 1610   BASOSABS 0.0 05/19/2024 1610    BMET    Component Value Date/Time   NA 136 05/21/2024 0543   K 4.5 05/21/2024 0543   CL 95 (L) 05/21/2024 0543   CO2 26 05/21/2024 0543   GLUCOSE 117 (H) 05/21/2024 0543   BUN 21 (H) 05/21/2024 0543   CREATININE 4.76 (H) 05/21/2024 0543   CALCIUM  9.4 05/21/2024 0543   GFRNONAA 11 (L) 05/21/2024 0543    IMAGING past 24 hours EEG adult Result Date: 05/20/2024 [name] [name] KIDD, MD     05/20/2024  3:26 PM Patient Name: [name] [name] MRN: [id number] Epilepsy Attending: Arlin KIDD [name] Referring Physician/Provider: Remi Pippin, NP Date: 05/20/2024 Duration: 29.32 mins Patient history: 43yo F with right temporal ICH. EEG to evaluate for seizure Level of alertness: comatose/ lethargic AEDs during EEG study: LCM, Ativan  Technical aspects: This EEG study was done with scalp electrodes positioned according to the 10-20 International system of electrode placement. Electrical activity was reviewed with band pass filter of 1-70Hz , sensitivity of 7 uV/mm, display speed of 82mm/sec with a 60Hz  notched filter applied as appropriate. EEG data were recorded continuously and digitally stored.  Video monitoring was available and reviewed as appropriate. Description: EEG showed continuous generalized and  lateralized right hemisphere 3 to 5 Hz theta-delta slowing admixed with 13-15hz  beta activity. Generalized periodic discharges with triphasic morphology were also noted at 1 Hz.  Hyperventilation and photic stimulation were not performed.   ABNORMALITY - Periodic discharges with triphasic morphology, generalized ( GPDs) - Continuous slow, generalized and lateralized right hemisphere IMPRESSION: This study showed generalized periodic discharges with triphasic morphology. This EEG pattern can be seen due to toxic-metabolic causes. However, due to patient's complicated history, can also be on the ictal-interictal continuum but with low suspicion for seizure recurrence. Additionally there is cortical dysfunction arising from right hemisphere likely secondary to underlying structural abnormality. Additionally there is moderate to severe diffuse encephalopathy. No seizures were seen throughout the recording. [name] [name]   DG CHEST PORT 1 VIEW Result Date: 05/20/2024 CLINICAL DATA:  Central line placement. EXAM: PORTABLE CHEST 1 VIEW COMPARISON:  May 19, 2024. FINDINGS: Stable cardiomegaly. Endotracheal and nasogastric tubes are unchanged. Interval placement of left subclavian catheter with distal tip in expected position of cavoatrial junction. No pneumothorax is noted. Both lungs are clear. The visualized skeletal structures are unremarkable. IMPRESSION: Interval placement of left subclavian catheter with distal tip in expected position of cavoatrial junction. Electronically Signed   By: [name] [name] M.D.   On: 05/20/2024 14:52    Vitals:   05/21/24 1100 05/21/24 1144 05/21/24 1200 05/21/24 1215  BP: 118/62 (!) 158/48 120/60   Pulse: 95 (!) 104 (!) 103 (!) 106  Resp: 14 16 18 12   Temp:   (!) 100.4 F (38 C)   TempSrc:   Axillary   SpO2: 100%  100% 100%  Weight:         PHYSICAL EXAM General: Largely unresponsive CV: Regular rate and rhythm on monitor Respiratory: Mechanically  ventilated GI: GJ tube in place   NEURO:  Mental Status: Eyes open with noxious stimuli, not following commands  Cranial Nerves:  II: PERRL.  III, IV, VI: Gaze disconjugate VII: Face is symmetrical resting and smiling VIII:  IX, X: Cough and gag intact XI: Head is midline XII: tongue is midline without fasciculations. Motor: Withdraws to noxious stimuli, decerebrate posturing on the left. No purposeful movement noted.  Tone: is normal and bulk is normal Coordination deferred Gait- deferred  Most Recent NIH 24   ASSESSMENT/PLAN  Ms. [name] [name] is a 43 y.o. female with history of hypertension, ESRD on HD status post renal and pancreatic transplants, diabetes type 1, gastroparesis, multiple strokes both ischemic and hemorrhagic along with prepontine subarachnoid hemorrhage which was angio negative, and during last admission had hydrocephalus requiring EVD placement, then discharged to a nursing facility-today had a change in mentation at dialysis where she had a leftward gaze and left-sided weakness for which EMS was called.Her blood pressures were in the systolic 200s.    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 Acute hemorrhage in the medial right temporal lobe, inseparable from the intraventricular hemorrhage. Small foci of residual/recurrent hemorrhage and/or mineralization in the left cerebellar hemisphere. 9/15- repeat CT head - No significant change in large volume of acute intraventricular hemorrhage and moderate hydrocephalus. Similar intraparenchymal hemorrhage in the right hippocampal region that is inseparable from the intraventricular hemorrhage.  MRI   Intraventricular hemorrhage with ventriculomegaly and left frontal ventriculostomy tube in place. No acute ischemic infarct. Multiple old infarcts including both sides of the pons,  left parietal lobe and left inferior cerebellum. MRA motion-degraded, no significant stenosis CT repeat in am 2D Echo EF 60-65% in 03/2024 LDL 38 HgbA1c 3.6 VTE prophylaxis - SCDs No antithrombotic prior to admission, now on No antithrombotic Therapy recommendations:  Pending Disposition:  Pending, palliative care on board, full code   Hx of ICH 02/2024- Perimesencephalic SAH with IVH and hydrocephalus.  Status post EVD.  Cerebral angiogram no aneurysm or AVM but diffuse anterior circulation vasospasm.  MRI [place] small couple callosum and cerebellar infarct.  EF 60 to 65%, LDL 63, A1c 4.2.  EEG sharp waves, concerning for seizure activity, put on Vimpat .  Patient has prolonged hospitalization and enterally discharged to SNF.  Hx of Seizures Concerning for seizure during last admission for [facility] and IVH. Discharged on lacosamide   EEG generalized periodic discharges with triphasic morphology. This EEG pattern can be seen due to toxic-metabolic causes. However, due to patient's complicated history, can also be on the ictal-interictal continuum but with low suspicion for seizure recurrence.  Continue home lacosamide  100 mg twice daily  Obstructive hydrocephalus EVD in place at 10 cm of water  Management per NSGY CT and MRI no significant midline shift CT repeat in am  Respiratory failure  Obtunded on arrival to ED, intubated by CCM SBT/VAP per CCM Is having episodes of apnea on wean Appreciate CCM assistance  Hypertension Home meds:  Hydralazine , Amlodipine  and clonidine   On Cleviprex   BP goal less than 160 On amlodipine  10 and clonidine   0.3 tid and coreg  25 bid  Diabetes type I Controlled Hx of pancreas transplant Home meds:  Cellcept , prednisone , and cyclosporine   HgbA1c 3.6, goal < 7.0 CBGs SSI Recommend close follow-up with PCP for better DM control  ESRD on HD Hx of renal transplant (failed) Home meds: Cellcept , prednisone , and cyclosporine   Creatinine  2.80--3.55--4.76 HD schedule is MWF Nephrology consulted  Dysphagia Patient has GJ tube, however GJ tube clogged -> replaced by IR today On TF @ 50  Other stroke risk factors  Other medical issues Hypokalemia, K 3.1--2.8-3.6--4.5  Hospital day # 2  Patient seen and examined by NP/APP with MD. MD to update note as needed.    Harlene Pouch, MSN, NP-C [name] See AMION or use Epic Chat  ATTENDING NOTE: I reviewed above note and agree with the assessment and plan. Pt was seen and examined.   Mom at bedside.  Patient still intubated, lying in bed, nonresponsive.  Neuroexam essentially unchanged.  EVD drainage patent. Low dose Precedex . With repetitive stimulation, patient able to open eyes but not blinking to visual threat, not following any commands. Doll's eyes present, pupil equal size but sluggish, corneal reflexes right stronger than left. Cough and gag present. Complaints mentation, left upper extremity extension posturing, no significant movement in other extremities.   Still on high dose cleviprex . Now has GJ tube replaced, will increase po BP meds and taper down cleviprex  as able. Will start TF. I had long discussion with mom at bedside, updated pt current condition, treatment plan and potential poor prognosis, and answered all the questions. She expressed understanding and appreciation and she would like full code and full scope care.    For detailed assessment and plan, please refer to above as I have made changes wherever appropriate.   [name] Cummins, MD PhD Stroke Neurology 05/21/2024 6:00 PM  This patient is critically ill due to recurrent ICH, IVH, hydrocephalus status post EVD placement, respiratory failure and at significant risk of neurological worsening, death form obstructive hydrocephalus, increased ICP, recurrent ICH, sepsis. This patient's care requires constant monitoring of vital signs, hemodynamics, respiratory and cardiac monitoring, review of  multiple databases, neurological assessment, discussion with family, other specialists and medical decision making of high complexity. I spent 45 minutes of neurocritical care time in the care of this patient.  I discussed with Dr. Harold CCM    To contact Stroke Continuity provider, please refer to [email or web address]. After hours, contact General Neurology

## 2024-05-21 NOTE — Progress Notes (Signed)
 NAME:  [name] [name], MRN:  [id number], DOB:  01/31/1981, LOS: 2 ADMISSION DATE:  05/19/2024 CONSULTATION DATE:  9/15/20025 REFERRING MD: Patsey - EDP, CHIEF COMPLAINT:  Code Stroke, ICH   History of Present Illness:  43 year old woman who presented to [facility] ED 9/15 as a Code Stroke. 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 Recent admissions 6/11-7/28 and 7/29-8/13 for ICH.  Patient presented to [facility] ED from dialysis as a Code Stroke; reportedly LKW 1450 with development of obtundation and hemiparesis. SBP 200s. NIHSS 31 with initial leftward gaze, decreased LOC/disorientation, bilateral facial droop/arm weakness/leg weakness, global aphasia, dysarthria, visual neglect. CT Head demonstrated large volume IVH with moderate hydrocephalus, acute hemorrhage of medial R temporal lobe and small foci of residual/recurrent hemorrhage in the L cerebellar hemisphere.   Patient was seen by stroke team and neurosurgery, EVD was placed on left side, PCCM was consulted for medical evaluation and admission to neuro ICU  Pertinent Medical History:   Past Medical History:  Diagnosis Date   Anemia    Anxiety    DM (diabetes mellitus), type 1 (HCC)    ESRD on hemodialysis (HCC)    Gastroparesis    History of simultaneous kidney and pancreas transplant (HCC) 2014   Hypertension    Ischemic cerebrovascular accident (CVA) (HCC) 11/2021   Narcotic abuse (HCC)    Non compliance with medical treatment    Stroke [facility])    Vision loss    Significant Hospital Events: Including procedures, antibiotic start and stop dates in addition to other pertinent events   9/15 admitted with ICH plus IVH, EVD was placed for hydrocephalus 9/16 remain on clevidipine  infusion, tolerating spontaneous breathing trial, EVD is draining bloody fluid  Interim History /  Subjective:  Patient remained on high-dose clevidipine  infusion She is afebrile Seen by nephrology, scheduled for hemodialysis today  Objective:  Blood pressure 123/67, pulse 99, temperature 98.4 F (36.9 C), temperature source Axillary, resp. rate 14, weight 50.4 kg, SpO2 100%.    Vent Mode: PRVC FiO2 (%):  [40 %] 40 % Set Rate:  [12 bmp] 12 bmp Vt Set:  [490 mL] 490 mL PEEP:  [5 cmH20] 5 cmH20 Pressure Support:  [8 cmH20] 8 cmH20 Plateau Pressure:  [16 cmH20-17 cmH20] 17 cmH20   Intake/Output Summary (Last 24 hours) at 05/21/2024 0826 Last data filed at 05/21/2024 0800 Gross per 24 hour  Intake 1325.98 ml  Output 144 ml  Net 1181.98 ml   Filed Weights   05/19/24 1600 05/20/24 0700 05/21/24 0701  Weight: 54.6 kg 50.4 kg 50.4 kg   Physical Examination: General: Crtitically ill-appearing female, orally intubated HEENT: [place]/AT, eyes anicteric.  ETT and OGT in place Neuro: Sedated, not following commands.  Eyes are closed.  Pupils 3 mm bilateral reactive to light Chest: Coarse breath sounds, no wheezes or rhonchi Heart: Regular rate and rhythm, no murmurs or gallops Abdomen: Soft, nondistended, bowel sounds present, PEG tube in place   Labs and images reviewed  Patient Lines/Drains/Airways Status     Active Line/Drains/Airways     Name Placement date Placement time Site Days   Arterial Line 05/19/24 Left Radial 05/19/24  2045  Radial  1   Peripheral IV 05/19/24 Anterior;Distal;Left Wrist 05/19/24  1859  Wrist  1   Peripheral IV 05/19/24 22 G 2.5 Left;Anterior Forearm 05/19/24  2251  Forearm  1   Fistula /  Graft Right Upper arm Arteriovenous fistula 12/08/20  1403  Upper arm  1259   NG/OG Vented/Dual Lumen 10 Fr. Right nare 03/20/24  1700  Right nare  61   NG/OG Vented/Dual Lumen 16 Fr. Oral Marking at nare/corner of mouth 66 cm 05/19/24  2100  Oral  1   Gastrostomy/Enterostomy Gastrostomy;Jejunostomy 18 Fr. LUQ 04/15/24  1329  LUQ  35   ICP/Ventriculostomy Ventricular  drainage catheter Left Parietal region 05/19/24  1730  Parietal region  1   Airway 7.5 mm 05/19/24  1842  -- 1   Wound 05/19/24 1933 Pressure Injury Buttocks Bilateral Stage 2 -  Partial thickness loss of dermis presenting as a shallow open injury with a red, pink wound bed without slough. 05/19/24  1933  Buttocks  1         Resolved Hospital Problem List:    Assessment & Plan:  Acute right parietotemporal intraparenchymal hemorrhage with IVH with ICH score 2 due to uncontrolled hypertension Hypertensive emergency Obstructive hydrocephalus status post EVD Acute left parietal ischemic stroke was ruled out Acute encephalopathy in the setting of ICH and hydrocephalus Acute respiratory insufficiency End-stage renal disease on hemodialysis with failed renal transplant on immunosuppressive therapy Hypokalemia/hypophosphatemia Type 1 diabetes, complicated with vasculopathy and gastroparesis Narcotic abuse Moderate protein calorie malnutrition  Continue neuro watch Stroke team and neurosurgery are following EVD remain in and, draining bloody fluid Blood pressure is not well-controlled, currently on high dose clevidipine  infusion Continue amlodipine , increase clonidine  to 0.3 mg 3 times daily Started on Coreg  25 mg twice daily Continue as needed labetalol  Closely monitor EVD output MRI brain was done yesterday which ruled out acute ischemic stroke, she had multiple chronic strokes bilaterally Minimize sedation with RASS goal 0/-1 Continue lung protective ventilation Once sedation is off, will try to keep continue breathing trial and extubate Nephrology is following, she is scheduled for hemodialysis Restarted back on mycophenolate  and prednisone  Closely monitor and supplement electrolytes Blood sugars are controlled, continue sliding scale insulin  Continue tube feeds and dietary supplements   Labs:  CBC: Recent Labs  Lab 05/19/24 1610 05/19/24 1615 05/19/24 2058 05/20/24 0456  05/20/24 1028 05/21/24 0543  WBC 4.8  --   --  5.1  --  6.5  NEUTROABS 4.2  --   --   --   --   --   HGB 12.2 12.9 11.6* 9.8* 9.2* 8.5*  HCT 38.5 38.0 34.0* 30.5* 27.0* 27.2*  MCV 98.2  --   --  96.8  --  100.0  PLT 114*  --   --  119*  --  101*   Basic Metabolic Panel: Recent Labs  Lab 05/19/24 1610 05/19/24 1615 05/19/24 2058 05/20/24 0456 05/20/24 1028  NA 138 139 137 138 138  K 3.1* 3.0* 2.8* 2.7* 3.6  CL 95* 97*  --  95*  --   CO2 24  --   --  31  --   GLUCOSE 147* 154*  --  126*  --   BUN 10 9  --  14  --   CREATININE 2.80* 2.90*  --  3.55*  --   CALCIUM  9.3  --   --  9.7  --   MG  --   --   --  2.4  --   PHOS  --   --   --  2.4*  --    GFR: Estimated Creatinine Clearance: 16.4 mL/min (A) (by C-G formula based on SCr of 3.55 mg/dL (H)). Recent  Labs  Lab 05/19/24 1610 05/20/24 0456 05/21/24 0543  WBC 4.8 5.1 6.5   Liver Function Tests: Recent Labs  Lab 05/19/24 1610  AST 23  ALT 19  ALKPHOS 88  BILITOT 0.9  PROT 7.3  ALBUMIN 3.6   No results for input(s): LIPASE, AMYLASE in the last 168 hours. No results for input(s): AMMONIA in the last 168 hours.  ABG:    Component Value Date/Time   PHART 7.470 (H) 05/20/2024 1028   PCO2ART 45.3 05/20/2024 1028   PO2ART 196 (H) 05/20/2024 1028   HCO3 32.9 (H) 05/20/2024 1028   TCO2 34 (H) 05/20/2024 1028   ACIDBASEDEF 5.2 (H) 01/13/2015 0300   O2SAT 100 05/20/2024 1028   Coagulation Profile: Recent Labs  Lab 05/19/24 1610 05/20/24 0456  INR 1.1 1.1   Cardiac Enzymes: No results for input(s): CKTOTAL, CKMB, CKMBINDEX, TROPONINI in the last 168 hours.  HbA1C: Hgb A1c MFr Bld  Date/Time Value Ref Range Status  05/20/2024 04:56 AM 3.6 (L) 4.8 - 5.6 % Final    Comment:    (NOTE) Diagnosis of Diabetes The following HbA1c ranges recommended by the American Diabetes Association (ADA) may be used as an aid in the diagnosis of diabetes mellitus.  Hemoglobin             Suggested A1C NGSP%               Diagnosis  <5.7                   Non Diabetic  5.7-6.4                Pre-Diabetic  >6.4                   Diabetic  <7.0                   Glycemic control for                       adults with diabetes.    02/14/2024 09:49 AM 4.2 (L) 4.8 - 5.6 % Final    Comment:    (NOTE) Diagnosis of Diabetes The following HbA1c ranges recommended by the American Diabetes Association (ADA) may be used as an aid in the diagnosis of diabetes mellitus.  Hemoglobin             Suggested A1C NGSP%              Diagnosis  <5.7                   Non Diabetic  5.7-6.4                Pre-Diabetic  >6.4                   Diabetic  <7.0                   Glycemic control for                       adults with diabetes.     CBG: Recent Labs  Lab 05/20/24 1536 05/20/24 1927 05/20/24 2321 05/21/24 0322 05/21/24 0734  GLUCAP 126* 144* 129* 110* 111*   The patient is critically ill due to acute right parietotemporal intraparenchymal hemorrhage with IVH in the setting of hypertensive emergency.  Critical care was necessary to treat or prevent imminent or life-threatening deterioration.  Critical care was time spent personally by me on the following activities: development of treatment plan with patient and/or surrogate as well as nursing, discussions with consultants, evaluation of patient's response to treatment, examination of patient, obtaining history from patient or surrogate, ordering and performing treatments and interventions, ordering and review of laboratory studies, ordering and review of radiographic studies, pulse oximetry, re-evaluation of patient's condition and participation in multidisciplinary rounds.   During this encounter critical care time was devoted to patient care services described in this note for 37 minutes.     Valinda Novas, MD [place] [facility] See Amion for pager If no response to pager, please call [phone] until 7pm After 7pm, Please  call E-link [phone]

## 2024-05-21 NOTE — Progress Notes (Addendum)
 Nutrition Brief Note  IR repaired GJ tube. J-tube ok to be used for EN per RN. MD ok to titrate TF to goal. Mother at beside reports her J-tube has only been cracked for a  couple hours PTA unsure if this is accurate. She was trying to give her soft foods at SNF but she would throw up. RN administering medications via J-tube. Reached out to pharmacy and MD to change medications to liquid to be administered through J-tube. Also discussed using g tube for medications.   INTERVENTION:  Initiate tube feeding via J tube: Osmolite 1.5 at 20 and increase by 10 ml/hr as tolerated until goal of 50 ml/h (1200 ml per day) Prosource TF20 60 ml DAILY   Provides 1880 kcal, 95 gm protein, 914 ml free water  daily   Renal MVI with minerals daily if administered through G-tube  100 mg IV Thiamine daily if administered through G-tube When tube feeds started, monitor magnesium , potassium, and phosphorus daily for at least 3 days, MD to replete as needed, as pt is at risk for refeeding syndrome Recommend transitioning to liquid medications if able as J-tubes are very prone to clogging.  Recommend transitioning medications to be administered through G tube if pt tolerates    NUTRITION DIAGNOSIS:    Moderate Malnutrition related to chronic illness (ESRD on HD/DM and gastroparesis) as evidenced by severe muscle depletion, mild fat depletion.   GOAL:    Patient will meet greater than or equal to 90% of their needs   MONITOR:    Diet advancement, Vent status, Labs, I & O's, TF tolerance   [name] [name], RD Registered Dietitian  See Amion for more information

## 2024-05-21 NOTE — Progress Notes (Signed)
 OT Cancellation Note  Patient Details Name: Billi Bright MRN: [id number] DOB: 10/29/1980   Cancelled Treatment:    Reason Eval/Treat Not Completed: Medical issues which prohibited therapy (intuabted, sedated, RN asking OT to hold this date.)  Excell Neyland K, OTD, OTR/L SecureChat Preferred Acute Rehab [phone]   Laneta MARLA Pereyra 05/21/2024, 9:13 AM

## 2024-05-22 ENCOUNTER — Inpatient Hospital Stay (HOSPITAL_COMMUNITY)

## 2024-05-22 DIAGNOSIS — Z515 Encounter for palliative care: Secondary | ICD-10-CM | POA: Diagnosis not present

## 2024-05-22 DIAGNOSIS — N186 End stage renal disease: Secondary | ICD-10-CM | POA: Diagnosis not present

## 2024-05-22 DIAGNOSIS — I611 Nontraumatic intracerebral hemorrhage in hemisphere, cortical: Secondary | ICD-10-CM | POA: Diagnosis not present

## 2024-05-22 DIAGNOSIS — E1143 Type 2 diabetes mellitus with diabetic autonomic (poly)neuropathy: Secondary | ICD-10-CM | POA: Diagnosis not present

## 2024-05-22 DIAGNOSIS — I610 Nontraumatic intracerebral hemorrhage in hemisphere, subcortical: Secondary | ICD-10-CM | POA: Diagnosis not present

## 2024-05-22 DIAGNOSIS — E876 Hypokalemia: Secondary | ICD-10-CM | POA: Diagnosis not present

## 2024-05-22 DIAGNOSIS — I161 Hypertensive emergency: Secondary | ICD-10-CM | POA: Diagnosis not present

## 2024-05-22 DIAGNOSIS — Z992 Dependence on renal dialysis: Secondary | ICD-10-CM | POA: Diagnosis not present

## 2024-05-22 DIAGNOSIS — R29721 NIHSS score 21: Secondary | ICD-10-CM | POA: Diagnosis not present

## 2024-05-22 DIAGNOSIS — I120 Hypertensive chronic kidney disease with stage 5 chronic kidney disease or end stage renal disease: Secondary | ICD-10-CM | POA: Diagnosis not present

## 2024-05-22 DIAGNOSIS — I615 Nontraumatic intracerebral hemorrhage, intraventricular: Secondary | ICD-10-CM | POA: Diagnosis not present

## 2024-05-22 LAB — CBC
HCT: 27.5 % — ABNORMAL LOW (ref 36.0–46.0)
Hemoglobin: 8.9 g/dL — ABNORMAL LOW (ref 12.0–15.0)
MCH: 31.7 pg (ref 26.0–34.0)
MCHC: 32.4 g/dL (ref 30.0–36.0)
MCV: 97.9 fL (ref 80.0–100.0)
Platelets: 103 K/uL — ABNORMAL LOW (ref 150–400)
RBC: 2.81 MIL/uL — ABNORMAL LOW (ref 3.87–5.11)
RDW: 14.4 % (ref 11.5–15.5)
WBC: 7.3 K/uL (ref 4.0–10.5)
nRBC: 0 % (ref 0.0–0.2)

## 2024-05-22 LAB — BASIC METABOLIC PANEL WITH GFR
Anion gap: 15 (ref 5–15)
BUN: 12 mg/dL (ref 6–20)
CO2: 27 mmol/L (ref 22–32)
Calcium: 9 mg/dL (ref 8.9–10.3)
Chloride: 91 mmol/L — ABNORMAL LOW (ref 98–111)
Creatinine, Ser: 2.23 mg/dL — ABNORMAL HIGH (ref 0.44–1.00)
GFR, Estimated: 28 mL/min — ABNORMAL LOW (ref >60)
Glucose, Bld: 136 mg/dL — ABNORMAL HIGH (ref 70–99)
Potassium: 3.8 mmol/L (ref 3.5–5.1)
Sodium: 133 mmol/L — ABNORMAL LOW (ref 135–145)

## 2024-05-22 LAB — GLUCOSE, CAPILLARY
Glucose-Capillary: 125 mg/dL — ABNORMAL HIGH (ref 70–99)
Glucose-Capillary: 152 mg/dL — ABNORMAL HIGH (ref 70–99)
Glucose-Capillary: 161 mg/dL — ABNORMAL HIGH (ref 70–99)
Glucose-Capillary: 180 mg/dL — ABNORMAL HIGH (ref 70–99)
Glucose-Capillary: 181 mg/dL — ABNORMAL HIGH (ref 70–99)

## 2024-05-22 LAB — MAGNESIUM: Magnesium: 2.1 mg/dL (ref 1.7–2.4)

## 2024-05-22 LAB — PHOSPHORUS: Phosphorus: 3.2 mg/dL (ref 2.5–4.6)

## 2024-05-22 MED ORDER — SODIUM CHLORIDE 0.9 % IV SOLN
3.0000 g | INTRAVENOUS | Status: DC
  Administered 2024-05-22: 3 g via INTRAVENOUS
  Filled 2024-05-22: qty 8

## 2024-05-22 MED ORDER — POLYETHYLENE GLYCOL 3350 17 G PO PACK
17.0000 g | PACK | Freq: Two times a day (BID) | ORAL | Status: DC
  Administered 2024-05-22: 17 g
  Filled 2024-05-22: qty 1

## 2024-05-22 MED ORDER — CYCLOSPORINE MODIFIED (GENGRAF) 100 MG/ML PO SOLN
225.0000 mg | Freq: Every day | ORAL | Status: DC
  Administered 2024-05-23 – 2024-08-08 (×75): 225 mg
  Filled 2024-05-22 (×2): qty 2.25
  Filled 2024-05-22: qty 2.3
  Filled 2024-05-22: qty 2.25
  Filled 2024-05-22: qty 2.3
  Filled 2024-05-22: qty 2.25
  Filled 2024-05-22 (×2): qty 2.3
  Filled 2024-05-22 (×6): qty 2.25
  Filled 2024-05-22 (×2): qty 2.3
  Filled 2024-05-22 (×5): qty 2.25
  Filled 2024-05-22 (×3): qty 2.3
  Filled 2024-05-22: qty 2.25
  Filled 2024-05-22 (×4): qty 2.3
  Filled 2024-05-22 (×2): qty 2.25
  Filled 2024-05-22 (×2): qty 2.3
  Filled 2024-05-22 (×4): qty 2.25
  Filled 2024-05-22 (×3): qty 2.3
  Filled 2024-05-22 (×2): qty 2.25
  Filled 2024-05-22 (×2): qty 2.3
  Filled 2024-05-22: qty 2.25
  Filled 2024-05-22 (×3): qty 2.3
  Filled 2024-05-22: qty 2.25
  Filled 2024-05-22: qty 2.3
  Filled 2024-05-22 (×3): qty 2.25
  Filled 2024-05-22: qty 2.3
  Filled 2024-05-22 (×8): qty 2.25
  Filled 2024-05-22: qty 2.3
  Filled 2024-05-22 (×2): qty 2.25
  Filled 2024-05-22 (×3): qty 2.3
  Filled 2024-05-22 (×2): qty 2.25
  Filled 2024-05-22: qty 2.3
  Filled 2024-05-22 (×7): qty 2.25

## 2024-05-22 MED ORDER — SENNOSIDES 8.8 MG/5ML PO SYRP
5.0000 mL | ORAL_SOLUTION | Freq: Two times a day (BID) | ORAL | Status: DC
  Administered 2024-05-22: 5 mL
  Filled 2024-05-22 (×11): qty 5

## 2024-05-22 MED ORDER — CHLORHEXIDINE GLUCONATE CLOTH 2 % EX PADS
6.0000 | MEDICATED_PAD | Freq: Every day | CUTANEOUS | Status: DC
  Administered 2024-05-23 – 2024-05-25 (×4): 6 via TOPICAL

## 2024-05-22 MED ORDER — SODIUM CHLORIDE 0.9 % IV SOLN
3.0000 g | Freq: Once | INTRAVENOUS | Status: AC
  Administered 2024-05-22: 3 g via INTRAVENOUS
  Filled 2024-05-22: qty 8

## 2024-05-22 MED ORDER — BISACODYL 10 MG RE SUPP
10.0000 mg | Freq: Once | RECTAL | Status: AC
  Administered 2024-05-22: 10 mg via RECTAL
  Filled 2024-05-22: qty 1

## 2024-05-22 MED ORDER — LOSARTAN POTASSIUM 50 MG PO TABS
100.0000 mg | ORAL_TABLET | Freq: Every day | ORAL | Status: DC
Start: 2024-05-22 — End: 2024-05-23
  Administered 2024-05-22: 100 mg
  Filled 2024-05-22: qty 2

## 2024-05-22 MED ORDER — CYCLOSPORINE MODIFIED (GENGRAF) 100 MG/ML PO SOLN
200.0000 mg | Freq: Every evening | ORAL | Status: DC
  Administered 2024-05-22 – 2024-08-07 (×76): 200 mg
  Filled 2024-05-22 (×81): qty 2

## 2024-05-22 NOTE — Plan of Care (Signed)

## 2024-05-22 NOTE — Progress Notes (Signed)
 Daily Progress Note   Patient Name: [name] [name]       Date: 05/22/2024 DOB: Jan 29, 1981  Age: 43 y.o. MRN#: [id number] Attending Physician: Harold Scholz, MD Primary Care Physician: [facility]mit Date: 05/19/2024  Reason for Consultation/Follow-up: Establishing goals of care  Patient Profile/HPI:    43 y.o. female  with past medical history of ESRD on HD, failed renal transplant, pancreatic transplant, DM1, gastroparesis recent admission 6/11-7/28 for ICH with d/c to [facility] and eventual d/c to [facility] on 8/13, admitted on 05/19/2024 from dialysis with code stroke. Workup revealed recurrent large intraparenchymal hemorrhage. EVD placed by neurosurgery. Palliative consulted for medical decision making.   Surrogate decision maker- HCPOA document on chart indicates Nakema Bar to be surrogate decision maker however Nori has been very difficult to reach and did not come for meeting today. I attempted to call her and received no return call. Patient's mother has been constantly at bedside. Per Palliative NP Camellia Kays- when [facility] document was completed it was due to patient's mother being in [place]. Because mother is here and readily available, and because Nori has been neglectful in duties of HCPOA- not returning phone calls, not showing for planned GOC discussion- then it is appropriate to defer decision making to patient's mother   Subjective:  Chart reviewed including labs, progress notes, imaging from this and previous encounters.  Tolerated SBT today. Tolerated HD.  CT shows regression of  ventriculomegaly, but large volume persists. No signs of ischemia. On eval Clayborne is making some spontaneous movements. Does not arouse. Mother is at bedside. She feels  Clayborne is having some improvements.  Case discussed with attending, Dr. Harold.   Review of Systems  Unable to perform ROS: Mental status change     Physical Exam Vitals and nursing note reviewed.  Constitutional:      Appearance: She is ill-appearing.  Cardiovascular:     Rate and Rhythm: Normal rate.  Pulmonary:     Comments: intubated Skin:    Coloration: Skin is pale.  Neurological:     Comments: unresponsive             Vital Signs: BP (!) (P) 123/56 (BP Location: Left Arm)   Pulse 88   Temp 98 F (36.7 C) (Axillary)   Resp (!) 23  Ht 5' 3 (1.6 m)   Wt 65.1 kg   SpO2 100%   BMI 25.42 kg/m  SpO2: SpO2: 100 % O2 Device: O2 Device: (P) Ventilator O2 Flow Rate:    Intake/output summary:  Intake/Output Summary (Last 24 hours) at 05/22/2024 1212 Last data filed at 05/22/2024 1100 Gross per 24 hour  Intake 1553.88 ml  Output 209 ml  Net 1344.88 ml   LBM: Last BM Date : 05/19/24 Baseline Weight: Weight: 54.6 kg Most recent weight: Weight: 65.1 kg       Palliative Assessment/Data: PPS: 10% (with OG feeding)      Patient Active Problem List   Diagnosis Date Noted   ICH (intracerebral hemorrhage) (HCC) 05/19/2024   Pontine hemorrhage (HCC) 02/15/2024   Malnutrition of moderate degree 02/15/2024   SAH (subarachnoid hemorrhage) (HCC) 02/15/2024   Colitis 02/14/2024   History of diabetic gastroparesis 02/14/2024   History of pancreatic islet cell transplantation 02/14/2024   Acute metabolic encephalopathy 02/14/2024   Prolonged QT interval 02/14/2024   Controlled type 2 diabetes mellitus with complication, without long-term current use of insulin  (HCC) 02/14/2024   Sepsis (HCC) 02/14/2024   Chronic, continuous use of opioids 11/12/2023   Need for acute hemodialysis due to missed session(HCC) 11/12/2023   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/12/2023   Vaginal bleeding 06/13/2023   ESRD needing dialysis (HCC) 03/07/2022   Chronic  pain 03/07/2022   ESRD (end stage renal disease) (HCC)    Hypertensive emergency 01/31/2022   Hypertensive crisis 01/30/2022   Hypervolemia associated with renal insufficiency 12/23/2021   Hemorrhagic gastritis    Subdural hematoma (HCC) 12/02/2021   Acute blood loss anemia 12/02/2021   Chronic abdominal pain 12/02/2021   Thrombocytopenia (HCC) 12/02/2021   Abnormal CT of the abdomen 12/02/2021   Type 1 diabetes (HCC) 12/02/2021   Fever 11/07/2021   Acute encephalopathy 11/06/2021   Pulmonary edema 11/06/2021   Hypertensive urgency 11/06/2021   Type 2 diabetes mellitus with peripheral neuropathy (HCC) 11/06/2021   History of renal transplant 11/06/2021   COVID-19 virus infection 11/06/2021   HTN (hypertension), malignant 08/02/2021   ESRD on dialysis (HCC) 02/25/2021   Coagulation defect, unspecified (HCC) 12/22/2020   Acute cough 10/04/2020   Allergy, unspecified, initial encounter 05/21/2020   Anaphylactic shock, unspecified, initial encounter 05/21/2020   Pancytopenia (HCC) 02/27/2020   Shortness of breath 04/23/2019   Hypocalcemia 12/30/2018   Aortic atherosclerosis (HCC) 12/27/2018   Acute respiratory failure with hypoxia (HCC) 12/22/2018   Hyperkalemia    Bilateral pneumonia 12/21/2018   Pancreas transplant status (HCC) 12/21/2018   Immunosuppression (HCC) 12/21/2018   Chest pain 12/21/2018   CAP (community acquired pneumonia) 12/20/2018   Other staphylococcus as the cause of diseases classified elsewhere 08/16/2018   Mild protein-calorie malnutrition (HCC) 04/18/2018   Encounter for screening for respiratory tuberculosis 04/06/2018   Gout 04/06/2018   Iron  deficiency anemia, unspecified 04/06/2018   Leiomyoma of uterus, unspecified 04/06/2018   Secondary hyperparathyroidism of renal origin (HCC) 04/06/2018   Acute rejection of kidney transplant 07/11/2017   Elevated serum creatinine 06/27/2017   E. coli UTI 12/18/2014   Transplant rejection 09/03/2014   Cyst of  ovary, left 09/01/2014   Immunosuppressive management encounter following kidney transplant 08/26/2014   Acne 04/28/2014   Itch of skin 04/28/2014   Diabetic gastroparesis associated with type 1 diabetes mellitus (HCC) 11/07/2013   Nausea and vomiting 11/07/2013   Lesion of spleen 10/07/2013   Metabolic acidosis 09/25/2013   Patient's  noncompliance with other medical treatment and regimen 09/12/2013   Narcotic dependence (HCC) 08/31/2013   Generalized pain 08/30/2013   Hypomagnesemia 08/30/2013   Failed kidney transplant 08/21/2013   Hypophosphatemia 07/28/2013   End stage renal disease (HCC) 02/06/2012   Pleural effusion, bilateral 11/16/2011   Anemia in chronic kidney disease (CKD) 11/16/2011   Pericardial effusion 11/15/2011   Fluid overload 11/15/2011   Abdominal pain, epigastric 11/15/2011   CKD (chronic kidney disease) stage 5, GFR less than 15 ml/min (HCC) 11/03/2011   Hypertension 10/27/2011   Renal failure, chronic 10/27/2011   MEDIAL EPICONDYLITIS, LEFT 12/17/2007   ABSCESS, AXILLA, LEFT 12/03/2007   Type 2 diabetes mellitus with other diabetic kidney complication (HCC) 11/05/2007    Palliative Care Assessment & Plan    Assessment/Recommendations/Plan  Large parenchymal hemorrhage with extension into ventricles- s/p EVD placement, intubated Continue full scope, full care PMT will continue to follow   Code Status:   Code Status: Full Code   Prognosis:  Unable to determine  Discharge Planning: To Be Determined  Care plan was discussed with patient's mother  Thank you for allowing the Palliative Medicine Team to assist in the care of this patient.  Total time:  Prolonged billing:  Time includes:   Preparing to see the patient (e.g., review of tests) Obtaining and/or reviewing separately obtained history Performing a medically necessary appropriate examination and/or evaluation Counseling and educating the patient/family/caregiver Ordering medications,  tests, or procedures Referring and communicating with other health care professionals (when not reported separately) Documenting clinical information in the electronic or other health record Independently interpreting results (not reported separately) and communicating results to the patient/family/caregiver Care coordination (not reported separately) Clinical documentation  Cassondra Stain, AGNP-C Palliative Medicine   Please contact Palliative Medicine Team phone at [phone] for questions and concerns.

## 2024-05-22 NOTE — Progress Notes (Addendum)
 PT OT.  STROKE TEAM PROGRESS NOTE    SIGNIFICANT HOSPITAL EVENTS 06/05/2024- Admitted with large ICH, IVH   INTERIM HISTORY/SUBJECTIVE Intubated, Precedex  infusing.  Palliative care on board, patient family would like full code and full scope.  CT repeat showed decreased hydrocephalus.   OBJECTIVE  CBC    Component Value Date/Time   WBC 7.3 05/22/2024 0500   RBC 2.81 (L) 05/22/2024 0500   HGB 8.9 (L) 05/22/2024 0500   HCT 27.5 (L) 05/22/2024 0500   PLT 103 (L) 05/22/2024 0500   MCV 97.9 05/22/2024 0500   MCH 31.7 05/22/2024 0500   MCHC 32.4 05/22/2024 0500   RDW 14.4 05/22/2024 0500   LYMPHSABS 0.4 (L) 2024/06/05 1610   MONOABS 0.2 06-05-24 1610   EOSABS 0.0 Jun 05, 2024 1610   BASOSABS 0.0 06/05/24 1610    BMET    Component Value Date/Time   NA 133 (L) 05/22/2024 0500   K 3.8 05/22/2024 0500   CL 91 (L) 05/22/2024 0500   CO2 27 05/22/2024 0500   GLUCOSE 136 (H) 05/22/2024 0500   BUN 12 05/22/2024 0500   CREATININE 2.23 (H) 05/22/2024 0500   CALCIUM  9.0 05/22/2024 0500   GFRNONAA 28 (L) 05/22/2024 0500    IMAGING past 24 hours CT HEAD WO CONTRAST ( ) Result Date: 05/22/2024 CLINICAL DATA:  43 year old female dialysis patient with a history of intracranial hemorrhages, previous right EVD. Code stroke presentation on 06-05-2024 with acute intraventricular hemorrhage and ventriculomegaly. EXAM: CT HEAD WITHOUT CONTRAST TECHNIQUE: Contiguous axial images were obtained from the base of the skull through the vertex without intravenous contrast. RADIATION DOSE REDUCTION: This exam was performed according to the departmental dose-optimization program which includes automated exposure control, adjustment of the mA and/or kV according to patient size and/or use of iterative reconstruction technique. COMPARISON:  Brain MRI yesterday.  Head CTs June 05, 2024 and earlier. FINDINGS: Brain: Left superior frontal approach EVD terminating just to the left of midline appears stable since  06-05-2024. Moderate to large volume of intraventricular hemorrhage, not significantly changed since 2220 hours on 05-Jun-2024. Ventriculomegaly has regressed, substantially since 1618 hours that day. Small volume parenchymal and extra-axial blood along the course of the ventriculostomy is stable. Basilar cisterns remain patent. No midline shift. Stable gray-white matter differentiation throughout the brain. Chronic encephalomalacia left cerebellum PICA territory. Chronic encephalomalacia left parietal lobe. Confluent bilateral white matter hypodensity otherwise. Vascular: Advanced Calcified atherosclerosis at the skull base. Skull: Stable vertex burr holes. No acute osseous abnormality identified. Sinuses/Orbits: Intubated. Middle ear and mastoid opacification, paranasal sinuses remain well aerated. Other: Left vertex EVD. No acute orbit or scalp soft tissue finding. Calcified scalp vessel atherosclerosis. IMPRESSION: 1. Stable left EVD. Regressed ventriculomegaly but no significant change in moderate to large volume of intraventricular hemorrhage since 06-05-24 - most pronounced in the right lateral ventricle. 2. No new intracranial abnormality. Underlying chronic ischemic and small vessel disease. Electronically Signed   By: VEAR Hurst M.D.   On: 05/22/2024 10:38    Vitals:   05/22/24 1145 05/22/24 1200 05/22/24 1215 05/22/24 1230  BP:  (!) 123/56    Pulse: 89 88 87 89  Resp: 18 (!) 23 14 16   Temp: 98 F (36.7 C)     TempSrc: Axillary     SpO2: 98% 100% 100% 100%  Weight:      Height:         PHYSICAL EXAM General: Largely unresponsive CV: Regular rate and rhythm on monitor Respiratory: Mechanically ventilated GI: GJ tube in place  NEURO:  Mental Status: Eyes open with noxious stimuli, not following commands  Cranial Nerves:  II: PERRL.  III, IV, VI: Gaze disconjugate VII: Face is symmetrical resting  VIII:  IX, X: Cough and gag intact XI: Head is midline XII: tongue is midline  without fasciculations. Motor: Withdraws to noxious stimuli, decerebrate posturing on the left. No purposeful movement noted.  Tone: is normal and bulk is normal Coordination deferred Gait- deferred  Most Recent NIH 21  1a Level of Conscious:1 1b LOC Questions: 2 1c LOC Commands: 2 2 Best Gaze: 1 3 Visual: 0 4 Facial Palsy: 0 5a Motor Arm - left: 2 5b Motor Arm - Right: 2 6a Motor Leg - Left: 3 6b Motor Leg - Right: 2 7 Limb Ataxia: 0 8 Sensory: 1 9 Best Language: 3 10 Dysarthria:UN 11 Extinct. and Inattention:2 TOTAL:21     ASSESSMENT/PLAN  Ms. Aerielle Naseer Jansma is a 43 y.o. female with history of hypertension, ESRD on HD status post renal and pancreatic transplants, diabetes type 1, gastroparesis, multiple strokes both ischemic and hemorrhagic along with prepontine subarachnoid hemorrhage which was angio negative, and during last admission had hydrocephalus requiring EVD placement, then discharged to a nursing facility-today had a change in mentation at dialysis where she had a leftward gaze and left-sided weakness for which EMS was called.Her blood pressures were in the systolic 200s.    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 Acute hemorrhage in the medial right temporal lobe, inseparable from the intraventricular hemorrhage. Small foci of residual/recurrent hemorrhage and/or mineralization in the left cerebellar hemisphere. 9/15- repeat CT head - No significant change in large volume of acute intraventricular hemorrhage and moderate hydrocephalus. Similar intraparenchymal hemorrhage in the right hippocampal region that is inseparable from the intraventricular hemorrhage.  MRI   Intraventricular hemorrhage with ventriculomegaly and left frontal ventriculostomy tube in place. No acute ischemic infarct. Multiple old infarcts including both  sides of the pons, left parietal lobe and left inferior cerebellum. MRA motion-degraded, no significant stenosis CT repeat 9/18 showed regressed ventriculomegaly, stable ICH and IVH 2D Echo EF 60-65% in 03/2024 LDL 38 HgbA1c 3.6 VTE prophylaxis - SCDs No antithrombotic prior to admission, now on No antithrombotic Therapy recommendations:  Pending Disposition:  Pending, palliative care on board, full code and full scope  Hx of ICH 02/2024- Perimesencephalic SAH with IVH and hydrocephalus.  Status post EVD.  Cerebral angiogram no aneurysm or AVM but diffuse anterior circulation vasospasm.  MRI [place] small couple callosum and cerebellar infarct.  EF 60 to 65%, LDL 63, A1c 4.2.  EEG sharp waves, concerning for seizure activity, put on Vimpat .  Patient has prolonged hospitalization and enterally discharged to SNF.  Hx of Seizures Concerning for seizure during last admission for [facility] and IVH. Discharged on lacosamide   EEG generalized periodic discharges with triphasic morphology. This EEG pattern can be seen due to toxic-metabolic causes. However, due to patient's complicated history, can also be on the ictal-interictal continuum but with low suspicion for seizure recurrence.  Continue home lacosamide  100 mg twice daily  Obstructive hydrocephalus EVD in place at 10 cm of water  Management per NSGY CT and MRI no significant midline shift CT repeat 9/18 showed regressed ventriculomegaly, stable IVH  Respiratory failure  Obtunded on arrival to ED, intubated by CCM SBT/VAP per CCM Off Precedex  Appreciate CCM assistance  Hypertension Home meds:  Hydralazine , Amlodipine  and  clonidine   On Cleviprex   BP goal less than 160 On amlodipine  10 and clonidine  0.3 tid and coreg  25 bid And losartan  9/18 Wean off Cleviprex  as able  Diabetes type I Controlled Hx of pancreas transplant Home meds:  Cellcept , prednisone , and cyclosporine   HgbA1c 3.6, goal < 7.0 CBGs SSI Recommend close follow-up with  PCP for better DM control  ESRD on HD Hx of renal transplant (failed) Home meds: Cellcept , prednisone , and cyclosporine   Creatinine 2.80--3.55--4.76--2.23 HD schedule is MWF Nephrology consulted  Dysphagia Patient has GJ tube, however GJ tube clogged -> replaced by IR 9/17 On TF @ 50  Other stroke risk factors  Other medical issues Hypokalemia, K 3.1--2.8-3.6--4.5--3.8  Hospital day # 3  Patient seen and examined by NP/APP with MD. MD to update note as needed.    Harlene Pouch, MSN, NP-C [name] See AMION or use Epic Chat   ATTENDING NOTE: I reviewed above note and agree with the assessment and plan. Pt was seen and examined.   Mom at bedside.  Patient had dialysis overnight and repeat CT in the morning showed stable IVH and ICH and decreased hydrocephalus.  On exam, patient eyes halfway open, not blinking to visual threat, not following commands, pupil bilateral small 1.5 mm, slight downward gaze bilaterally.  Mildly withdrawal bilateral upper extremities on stimulation, bilateral Babinski positive, mild withdrawal bilateral lower extremity pain stimulation.  Patient neurologically stable at this moment, no significant improvement or worsening.  CT stable.  Palliative care on board, full scope and full code.  Continue tube feeding and adding losartan  to current p.o. BP meds for better BP control.  Still on Cleviprex , wean off if able.  Patient likely need trach and PEG in the near future.  For detailed assessment and plan, please refer to above as I have made changes wherever appropriate.   Ary Cummins, MD PhD Stroke Neurology 05/22/2024 3:37 PM   This patient is critically ill due to recurrent ICH, IVH, hydrocephalus status post EVD placement, respiratory failure and at significant risk of neurological worsening, death form obstructive hydrocephalus, increased ICP, recurrent ICH, sepsis. This patient's care requires constant monitoring of vital signs,  hemodynamics, respiratory and cardiac monitoring, review of multiple databases, neurological assessment, discussion with family, other specialists and medical decision making of high complexity. I spent 40 minutes of neurocritical care time in the care of this patient.  I discussed with Dr. Harold CCM. I had long discussion with mom at bedside, updated pt current condition, treatment plan and potential prognosis, and answered all the questions.  She expressed understanding and appreciation.    To contact Stroke Continuity provider, please refer to [email or web address]. After hours, contact General Neurology

## 2024-05-22 NOTE — Progress Notes (Signed)
 Subjective: The patient is intubated in no apparent distress.  Her mother is at the bedside talking with her son/the patient's sister via FaceTime.  She tells me that he is a Psychologist, occupational in [place].  Objective: Vital signs in last 24 hours: Temp:  [97.4 F (36.3 C)-100.2 F (37.9 C)] 98 F (36.7 C) (09/18 1145) Pulse Rate:  [76-108] 88 (09/18 1200) Resp:  [8-34] 23 (09/18 1200) BP: (107-164)/(42-75) 123/56 (09/18 1200) SpO2:  [93 %-100 %] 100 % (09/18 1200) Arterial Line BP: (102-176)/(41-68) 163/44 (09/18 1200) FiO2 (%):  [40 %] 40 % (09/18 1106) Weight:  [65.1 kg] 65.1 kg (09/18 0555) Estimated body mass index is 25.42 kg/m as calculated from the following:   Height as of this encounter: 5' 3 (1.6 m).   Weight as of this encounter: 65.1 kg.   Intake/Output from previous day: 09/17 0701 - 09/18 0700 In: 1445.8 [I.V.:802.7; NG/GT:573.2; IV Piggyback:69.9] Out: 249 [Drains:249] Intake/Output this shift: Total I/O In: 578.7 [I.V.:233.7; NG/GT:210; IV Piggyback:135] Out: 26 [Drains:26]  Physical exam Glascow coma scale 6, intubated, E1M4V1.  The patient weakly flexes to painful stimuli.  Her pupils are small and equal.  Her ventriculostomy is patent.  I reviewed the patient's head CT performed today.  She continues have significant intraventricular hemorrhage in her lateral ventricles, right greater than left.  Her ventriculostomy is in good position.  Her fourth ventricle is enlarged.    Lab Results: Recent Labs    05/21/24 0543 05/22/24 0500  WBC 6.5 7.3  HGB 8.5* 8.9*  HCT 27.2* 27.5*  PLT 101* 103*   BMET Recent Labs    05/21/24 0543 05/22/24 0500  NA 136 133*  K 4.5 3.8  CL 95* 91*  CO2 26 27  GLUCOSE 117* 136*  BUN 21* 12  CREATININE 4.76* 2.23*  CALCIUM  9.4 9.0    Studies/Results: CT HEAD WO CONTRAST ( ) Result Date: 05/22/2024 CLINICAL DATA:  43 year old female dialysis patient with a history of intracranial hemorrhages, previous right EVD.  Code stroke presentation on 05/28/2024 with acute intraventricular hemorrhage and ventriculomegaly. EXAM: CT HEAD WITHOUT CONTRAST TECHNIQUE: Contiguous axial images were obtained from the base of the skull through the vertex without intravenous contrast. RADIATION DOSE REDUCTION: This exam was performed according to the departmental dose-optimization program which includes automated exposure control, adjustment of the mA and/or kV according to patient size and/or use of iterative reconstruction technique. COMPARISON:  Brain MRI yesterday.  Head CTs 05/28/24 and earlier. FINDINGS: Brain: Left superior frontal approach EVD terminating just to the left of midline appears stable since 05-28-2024. Moderate to large volume of intraventricular hemorrhage, not significantly changed since 2220 hours on May 28, 2024. Ventriculomegaly has regressed, substantially since 1618 hours that day. Small volume parenchymal and extra-axial blood along the course of the ventriculostomy is stable. Basilar cisterns remain patent. No midline shift. Stable gray-white matter differentiation throughout the brain. Chronic encephalomalacia left cerebellum PICA territory. Chronic encephalomalacia left parietal lobe. Confluent bilateral white matter hypodensity otherwise. Vascular: Advanced Calcified atherosclerosis at the skull base. Skull: Stable vertex burr holes. No acute osseous abnormality identified. Sinuses/Orbits: Intubated. Middle ear and mastoid opacification, paranasal sinuses remain well aerated. Other: Left vertex EVD. No acute orbit or scalp soft tissue finding. Calcified scalp vessel atherosclerosis. IMPRESSION: 1. Stable left EVD. Regressed ventriculomegaly but no significant change in moderate to large volume of intraventricular hemorrhage since 05-28-2024 - most pronounced in the right lateral ventricle. 2. No new intracranial abnormality. Underlying chronic ischemic and small vessel disease. Electronically Signed  By: VEAR Hurst  M.D.   On: 05/22/2024 10:38   IR GJ Tube Change Result Date: 05/21/2024 INDICATION: Catheter malfunctioning. Feeding tube dependent, altered mental status. EXAM: GASTROJEJUNOSTOMY TUBE EXCHANGE COMPARISON:  IR FLUOROSCOPY, 02/19/2024 and 04/15/2024. KUB, 04/01/2024. MEDICATIONS: None. CONTRAST:  100 mL Omnipaque  300 ANESTHESIA/SEDATION: Local anesthetic and single agent sedation was employed during this procedure. A total of Versed  1.0 mg was administered intravenously. The patient's level of consciousness and vital signs were monitored continuously by radiology nursing throughout the procedure under my direct supervision. FLUOROSCOPY: Radiation Exposure Index and estimated peak skin dose (PSD); Reference air kerma (RAK), 73.3 mGy. COMPLICATIONS: None. PROCEDURE: Informed written consent was obtained from the patient and/or patient's representative after a discussion of the risks and benefits. The upper abdomen and the external portion of the existing gastrojejunostomy tube was prepped and draped in the usual sterile fashion, and a sterile drape was applied covering the operative field. Maximum barrier sterile technique with sterile gowns and gloves were used for the procedure. A timeout was performed prior to the initiation of the procedure. The existing 18 Fr small bore gastrojejunostomy had refluxed into the stomach. The feeding tube was cannulated with a stiff Glidewire then removed, a 5 Fr Kumpe catheter was then manipulated into the proximal aspect of the small bowel. Contrast injection confirmed appropriate positioning. Under intermittent fluoroscopic guidance, a new 18 Fr large-bore gastrojejunostomy catheter was advanced with tip ultimately terminating within the proximal small bowel. Contrast injection via the jejunostomy and gastric lumens confirmed appropriate functioning and positioning. A dressing was placed. The patient tolerated procedure well without immediate postprocedural complication.  IMPRESSION: Successful fluoroscopic guided exchange of a refluxed, small bore 18 Fr tube for a new large bore 18 Fr feeding tube. The tip of the jejunostomy portion of the catheter lies within the proximal jejunum. Both lumens ready for immediate use. RECOMMENDATIONS: The patient will return to Vascular Interventional Radiology (VIR) for routine feeding tube evaluation and exchange in 6 months. Thom Hall, MD [facility] Electronically Signed   By: Thom Hall M.D.   On: 05/21/2024 15:37   EEG adult Result Date: 05/20/2024 Shelton Arlin KIDD, MD     05/20/2024  3:26 PM Patient Name: [name] [name] MRN: [id number] Epilepsy Attending: Arlin KIDD Shelton Referring Physician/Provider: Remi Pippin, NP Date: 05/20/2024 Duration: 29.32 mins Patient history: 43yo F with right temporal ICH. EEG to evaluate for seizure Level of alertness: comatose/ lethargic AEDs during EEG study: LCM, Ativan  Technical aspects: This EEG study was done with scalp electrodes positioned according to the 10-20 International system of electrode placement. Electrical activity was reviewed with band pass filter of 1-70Hz , sensitivity of 7 uV/mm, display speed of 22mm/sec with a 60Hz  notched filter applied as appropriate. EEG data were recorded continuously and digitally stored.  Video monitoring was available and reviewed as appropriate. Description: EEG showed continuous generalized and lateralized right hemisphere 3 to 5 Hz theta-delta slowing admixed with 13-15hz  beta activity. Generalized periodic discharges with triphasic morphology were also noted at 1 Hz.  Hyperventilation and photic stimulation were not performed.   ABNORMALITY - Periodic discharges with triphasic morphology, generalized ( GPDs) - Continuous slow, generalized and lateralized right hemisphere IMPRESSION: This study showed generalized periodic discharges with triphasic morphology. This EEG pattern can be seen due  to toxic-metabolic causes. However, due to patient's complicated history, can also be on the ictal-interictal continuum but with low suspicion for seizure recurrence. Additionally there is cortical dysfunction arising  from right hemisphere likely secondary to underlying structural abnormality. Additionally there is moderate to severe diffuse encephalopathy. No seizures were seen throughout the recording. Arlin MALVA Krebs   DG CHEST PORT 1 VIEW Result Date: 05/20/2024 CLINICAL DATA:  Central line placement. EXAM: PORTABLE CHEST 1 VIEW COMPARISON:  May 19, 2024. FINDINGS: Stable cardiomegaly. Endotracheal and nasogastric tubes are unchanged. Interval placement of left subclavian catheter with distal tip in expected position of cavoatrial junction. No pneumothorax is noted. Both lungs are clear. The visualized skeletal structures are unremarkable. IMPRESSION: Interval placement of left subclavian catheter with distal tip in expected position of cavoatrial junction. Electronically Signed   By: Lynwood Landy Raddle M.D.   On: 05/20/2024 14:52    Assessment/Plan: Intraventricular hemorrhage, hydrocephalus: I discussed the situation with the patient's mother at the bedside and with her brother via FaceTime.  We discussed the various treatment options.  I have answered all their questions.  They want to continue supportive care for now but understands she is not healthy at her baseline, is very sick, and may not make a good recovery from this second hemorrhage.   LOS: 3 days     [name] 05/22/2024, 12:16 PM     Patient ID: [name], female   DOB: 1981-09-03, 43 y.o.   MRN: [id number]

## 2024-05-22 NOTE — Progress Notes (Addendum)
 NAME:  [name] [name], MRN:  [id number], DOB:  18-Oct-1980, LOS: 3 ADMISSION DATE:  05/19/2024 CONSULTATION DATE:  9/15/20025 REFERRING MD: Patsey - EDP, CHIEF COMPLAINT:  Code Stroke, ICH   History of Present Illness:  43 year old woman who presented to [facility] ED 9/15 as a Code Stroke. 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 Recent admissions 6/11-7/28 and 7/29-8/13 for ICH.  Patient presented to [facility] ED from dialysis as a Code Stroke; reportedly LKW 1450 with development of obtundation and hemiparesis. SBP 200s. NIHSS 31 with initial leftward gaze, decreased LOC/disorientation, bilateral facial droop/arm weakness/leg weakness, global aphasia, dysarthria, visual neglect. CT Head demonstrated large volume IVH with moderate hydrocephalus, acute hemorrhage of medial R temporal lobe and small foci of residual/recurrent hemorrhage in the L cerebellar hemisphere.   Patient was seen by stroke team and neurosurgery, EVD was placed on left side, PCCM was consulted for medical evaluation and admission to neuro ICU  Pertinent Medical History:   Past Medical History:  Diagnosis Date   Anemia    Anxiety    DM (diabetes mellitus), type 1 (HCC)    ESRD on hemodialysis (HCC)    Gastroparesis    History of simultaneous kidney and pancreas transplant (HCC) 2014   Hypertension    Ischemic cerebrovascular accident (CVA) (HCC) 11/2021   Narcotic abuse (HCC)    Non compliance with medical treatment    Stroke [facility])    Vision loss    Significant Hospital Events: Including procedures, antibiotic start and stop dates in addition to other pertinent events   9/15 admitted with ICH plus IVH, EVD was placed for hydrocephalus 9/16 remain on clevidipine  infusion, tolerating spontaneous breathing trial, EVD is draining bloody fluid 9/17 continue to  require clevidipine  infusion, tolerated spontaneous breathing trial  Interim History / Subjective:  No overnight issues Currently off sedation Still on high-dose clevidipine  infusion  Objective:  Blood pressure (!) 117/57, pulse 94, temperature 98.5 F (36.9 C), temperature source Axillary, resp. rate 12, height 5' 3 (1.6 m), weight 65.1 kg, SpO2 100%.    Vent Mode: PRVC FiO2 (%):  [40 %] 40 % Set Rate:  [12 bmp] 12 bmp Vt Set:  [490 mL] 490 mL PEEP:  [5 cmH20] 5 cmH20 Plateau Pressure:  [18 cmH20] 18 cmH20   Intake/Output Summary (Last 24 hours) at 05/22/2024 0813 Last data filed at 05/22/2024 0800 Gross per 24 hour  Intake 1426.53 ml  Output 236 ml  Net 1190.53 ml   Filed Weights   05/20/24 0700 05/21/24 0701 05/22/24 0500  Weight: 50.4 kg 50.4 kg 65.1 kg   Physical Examination: General: Crtitically ill-appearing female, orally intubated HEENT: [place]/AT, eyes anicteric.  ETT in place.  EVD in place Neuro: Opens eyes to painful stimuli, not following commands, antigravity on left side, plegic right upper extremity, withdrawing in bilateral lower extremities Chest: Coarse breath sounds, no wheezes or rhonchi Heart: Regular rate and rhythm, no murmurs or gallops Abdomen: Soft, nondistended, bowel sounds present.  J-tube in place   Labs and images reviewed  Patient Lines/Drains/Airways Status     Active Line/Drains/Airways     Name Placement date Placement time Site Days   Arterial Line 05/19/24 Left Radial 05/19/24  2045  Radial  3   Peripheral IV 05/19/24 Anterior;Distal;Left Wrist 05/19/24  1859  Wrist  3   Peripheral IV 05/19/24 22 G 2.5 Left;Anterior Forearm 05/19/24  2251  Forearm  3   CVC Triple Lumen 05/20/24 Left Subclavian 05/20/24  1413  -- 2   Fistula / Graft Right Upper arm Arteriovenous fistula 12/08/20  1403  Upper arm  1261   Gastrostomy/Enterostomy PEG-jejunostomy 18 Fr. LUQ 05/21/24  0827  LUQ  1   ICP/Ventriculostomy Ventricular drainage catheter Left  Parietal region 05/19/24  1730  Parietal region  3   Airway 7.5 mm 05/19/24  1842  -- 3   Wound 05/19/24 1933 Pressure Injury Buttocks Bilateral Stage 1 -  Intact skin with non-blanchable redness of a localized area usually over a bony prominence. 05/19/24  1933  Buttocks  3          Resolved Hospital Problem List:  Hypokalemia/hypophosphatemia  Assessment & Plan:  Acute right parietotemporal intraparenchymal hemorrhage with IVH with ICH score 2 due to uncontrolled hypertension Hypertensive emergency Obstructive hydrocephalus status post EVD Acute left parietal ischemic stroke was ruled out Acute encephalopathy in the setting of ICH and hydrocephalus Acute respiratory insufficiency Aspiration pneumonia End-stage renal disease on hemodialysis with failed renal transplant on immunosuppressive therapy Hypervolemic hyponatremia Type 1 diabetes, complicated with vasculopathy and gastroparesis Narcotic abuse Moderate protein calorie malnutrition  Continue neuro watch Stroke team and neurosurgery are following EVD remain at 10 cm water , draining minimal fluid Blood pressure still not well-controlled, requiring clevidipine  infusion Continue amlodipine , clonidine  and Coreg  Started on losartan  100 mg daily Titrate clevidipine  Continue as needed labetalol  Closely monitor EVD output Minimize sedation with RASS goal 0/-1 Continue lung protective ventilation Patient is off sedation, tolerating spontaneous breathing trial but mental status precludes extubation VAP prevention bundle in place Patient is having increased respiratory secretions, will send respiratory culture Started on IV Unasyn  Nephrology is following, underwent scheduled hemodialysis yesterday Continue mycophenolate  and prednisone  Closely monitor and supplement electrolytes Blood sugars are controlled, continue sliding scale insulin  Continue tube feeds and dietary supplements   Labs:  CBC: Recent Labs  Lab  05/19/24 1610 05/19/24 1615 05/19/24 2058 05/20/24 0456 05/20/24 1028 05/21/24 0543 05/22/24 0500  WBC 4.8  --   --  5.1  --  6.5 7.3  NEUTROABS 4.2  --   --   --   --   --   --   HGB 12.2   < > 11.6* 9.8* 9.2* 8.5* 8.9*  HCT 38.5   < > 34.0* 30.5* 27.0* 27.2* 27.5*  MCV 98.2  --   --  96.8  --  100.0 97.9  PLT 114*  --   --  119*  --  101* 103*   < > = values in this interval not displayed.   Basic Metabolic Panel: Recent Labs  Lab 05/19/24 1610 05/19/24 1615 05/19/24 2058 05/20/24 0456 05/20/24 1028 05/21/24 0543 05/22/24 0500  NA 138 139 137 138 138 136 133*  K 3.1* 3.0* 2.8* 2.7* 3.6 4.5 3.8  CL 95* 97*  --  95*  --  95* 91*  CO2 24  --   --  31  --  26 27  GLUCOSE 147* 154*  --  126*  --  117* 136*  BUN 10 9  --  14  --  21* 12  CREATININE 2.80* 2.90*  --  3.55*  --  4.76* 2.23*  CALCIUM  9.3  --   --  9.7  --  9.4 9.0  MG  --   --   --  2.4  --   --  2.1  PHOS  --   --   --  2.4*  --  6.4* 3.2   GFR: Estimated Creatinine Clearance: 29.8 mL/min (A) (by C-G formula based on SCr of 2.23 mg/dL (H)). Recent Labs  Lab 05/19/24 1610 05/20/24 0456 05/21/24 0543 05/22/24 0500  WBC 4.8 5.1 6.5 7.3   Liver Function Tests: Recent Labs  Lab 05/19/24 1610 05/21/24 0543  AST 23  --   ALT 19  --   ALKPHOS 88  --   BILITOT 0.9  --   PROT 7.3  --   ALBUMIN 3.6 2.7*   No results for input(s): LIPASE, AMYLASE in the last 168 hours. No results for input(s): AMMONIA in the last 168 hours.  ABG:    Component Value Date/Time   PHART 7.470 (H) 05/20/2024 1028   PCO2ART 45.3 05/20/2024 1028   PO2ART 196 (H) 05/20/2024 1028   HCO3 32.9 (H) 05/20/2024 1028   TCO2 34 (H) 05/20/2024 1028   ACIDBASEDEF 5.2 (H) 01/13/2015 0300   O2SAT 100 05/20/2024 1028   Coagulation Profile: Recent Labs  Lab 05/19/24 1610 05/20/24 0456  INR 1.1 1.1   Cardiac Enzymes: No results for input(s): CKTOTAL, CKMB, CKMBINDEX, TROPONINI in the last 168 hours.  HbA1C: Hgb  A1c MFr Bld  Date/Time Value Ref Range Status  05/20/2024 04:56 AM 3.6 (L) 4.8 - 5.6 % Final    Comment:    (NOTE) Diagnosis of Diabetes The following HbA1c ranges recommended by the American Diabetes Association (ADA) may be used as an aid in the diagnosis of diabetes mellitus.  Hemoglobin             Suggested A1C NGSP%              Diagnosis  <5.7                   Non Diabetic  5.7-6.4                Pre-Diabetic  >6.4                   Diabetic  <7.0                   Glycemic control for                       adults with diabetes.    02/14/2024 09:49 AM 4.2 (L) 4.8 - 5.6 % Final    Comment:    (NOTE) Diagnosis of Diabetes The following HbA1c ranges recommended by the American Diabetes Association (ADA) may be used as an aid in the diagnosis of diabetes mellitus.  Hemoglobin             Suggested A1C NGSP%              Diagnosis  <5.7                   Non Diabetic  5.7-6.4                Pre-Diabetic  >6.4                   Diabetic  <7.0                   Glycemic control for                       adults with diabetes.     CBG: Recent Labs  Lab 05/21/24 1137 05/21/24 1524 05/21/24  1934 05/21/24 2327 05/22/24 0754  GLUCAP 121* 126* 138* 149* 181*   The patient is critically ill due to acute right parietotemporal intraparenchymal hemorrhage with IVH in the setting of hypertensive emergency.  Critical care was necessary to treat or prevent imminent or life-threatening deterioration.  Critical care was time spent personally by me on the following activities: development of treatment plan with patient and/or surrogate as well as nursing, discussions with consultants, evaluation of patient's response to treatment, examination of patient, obtaining history from patient or surrogate, ordering and performing treatments and interventions, ordering and review of laboratory studies, ordering and review of radiographic studies, pulse oximetry, re-evaluation of  patient's condition and participation in multidisciplinary rounds.   During this encounter critical care time was devoted to patient care services described in this note for 35 minutes.     Valinda Novas, MD [place] See Amion for pager If no response to pager, please call [phone] until 7pm After 7pm, Please call E-link [phone]

## 2024-05-22 NOTE — Progress Notes (Signed)
 [facility] Progress Note  Subjective:  Seen in ICU Mother at bedside  Vitals:   05/22/24 1215 05/22/24 1230 05/22/24 1245 05/22/24 1300  BP:      Pulse: 87 89 89 91  Resp: 14 16 16 19   Temp:      TempSrc:      SpO2: 100% 100% 100% 100%  Weight:      Height:        Exam: Gen on vent, sedated Sclera anicteric, throat w/ ETT No jvd or bruits Chest clear anterior/ lateral RRR no MRG Abd soft ntnd no mass or ascites +bs GU defer Ext no LE or UE edema Neuro is on vent, sedated   RUE AVF+bruit    Home bp meds: Norvasc  10 every day Catapres  patch 0.3mg  weekly Hydralazine  75mg  tid Metoprolol  50mg  bid    OP HD: [facility] MWF 3h  B400  55.2kg  2K bath   AVF  Heparin  none Last OP HD 9/15, about 2h 50 min then LOC Mircera 150 mcg q 2 wks, last 9/10, due 9/24       Assessment/ Plan: Acute R IC bleed: d/t uncont HTN. On vent, per neuro/ CCM.  ESRD: on HD MWF. Had HD here last night. Next HD tomorrow.  HTN: on IV clevidipine  + norvasc / coreg / clonidine / losartan   Volume: several kg under dry wt, will need lower dry wt at dc. Euvolemic on exam. 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  VDRF: per CCM H/o pancreas/ renal transplant: renal transplant failed, pancreas still working, getting CyA, cellcept  and prednisone .     Myer Fret MD  [facility] 05/22/2024, 1:53 PM  Recent Labs  Lab 05/19/24 1610 05/19/24 1615 05/21/24 0543 05/22/24 0500  HGB 12.2   < > 8.5* 8.9*  ALBUMIN 3.6  --  2.7*  --   CALCIUM  9.3   < > 9.4 9.0  PHOS  --    < > 6.4* 3.2  CREATININE 2.80*   < > 4.76* 2.23*  K 3.1*   < > 4.5 3.8   < > = values in this interval not displayed.   No results for input(s): IRON , TIBC, FERRITIN in the last 168 hours. Inpatient medications:  amLODipine   10 mg Per Tube Daily   carvedilol   25 mg Per Tube BID WC   Chlorhexidine  Gluconate Cloth  6 each Topical 1 day or 1 dose   Chlorhexidine  Gluconate Cloth  6 each Topical Q0600    cloNIDine   0.3 mg Per Tube TID   cycloSPORINE   200 mg Per Tube QPM   [START ON 05/23/2024] cycloSPORINE   225 mg Per Tube Daily   feeding supplement (PROSource TF20)  60 mL Per Tube Daily   heparin  injection (subcutaneous)  5,000 Units Subcutaneous Q12H   insulin  aspart  0-9 Units Subcutaneous Q4H   losartan   100 mg Per Tube Daily   mycophenolate   500 mg Per Tube BID   mouth rinse  15 mL Mouth Rinse Q2H   pantoprazole  (PROTONIX ) IV  40 mg Intravenous QHS   polyethylene glycol  17 g Per Tube BID   predniSONE   15 mg Per Tube Q breakfast   sennosides  5 mL Per Tube BID    ampicillin -sulbactam (UNASYN ) IV     clevidipine  15 mg/hr (05/22/24 1300)   dexmedetomidine  (PRECEDEX ) IV infusion Stopped (05/22/24 0743)   feeding supplement (OSMOLITE 1.5 CAL) 50 mL/hr at 05/22/24 1300   lacosamide  (VIMPAT ) IV Stopped (05/22/24  1057)   acetaminophen  **OR** acetaminophen  (TYLENOL ) oral liquid 160 mg/5 mL **OR** acetaminophen , docusate sodium , iohexol , iohexol , labetalol , mouth rinse

## 2024-05-22 NOTE — Progress Notes (Signed)
 PT Cancellation Note  Patient Details Name: [name] [name] MRN: [id number] DOB: June 19, 1981   Cancelled Treatment:    Reason Eval/Treat Not Completed: Medical issues which prohibited therapy;Patient not medically ready;PT screened, no needs identified, will sign off - pt remains intubated, sedated, not appropriate for PT at this time. Per rehab protocol, PT to sign off given x3 medical cancels. Please reconsult when medically appropriate.   Stalin Gruenberg S, PT DPT Acute Rehabilitation Services Secure Chat Preferred  Office [phone]    Oveda Dadamo E Stroup 05/22/2024, 9:05 AM

## 2024-05-22 NOTE — TOC Progression Note (Signed)
 Transition of Care [facility]) - Progression Note    Patient Details  Name: [name] [name] MRN: [id number] Date of Birth: July 15, 1981  Transition of [facility]) CM/SW Contact  Inocente GORMAN Kindle, LCSW Phone Number: 05/22/2024, 6:34 PM  Clinical Narrative:    CSW continuing to follow.    Expected Discharge Plan: Skilled Nursing Facility Barriers to Discharge: Continued Medical Work up, English as a second language teacher               Expected Discharge Plan and Services In-house Referral: Clinical Social Work   Post Acute Care Choice: Skilled Nursing Facility Living arrangements for the past 2 months: Apartment                                       Social Drivers of Health (SDOH) Interventions SDOH Screenings   Food Insecurity: Patient Unable To Answer (05/20/2024)  Housing: Patient Unable To Answer (05/20/2024)  Transportation Needs: Patient Unable To Answer (05/20/2024)  Utilities: Patient Unable To Answer (05/20/2024)  Social Connections: Unknown (10/04/2022)   Received from [facility]  Stress: No Stress Concern Present (04/16/2024)   Received from [facility]  Tobacco Use: Medium Risk (04/27/2024)    Readmission Risk Interventions    05/20/2024    4:16 PM 03/31/2024   11:27 AM 11/17/2021   12:15 PM  Readmission Risk Prevention Plan  Transportation Screening Complete Complete Complete  Medication Review Oceanographer) Complete Complete Complete  PCP or Specialist appointment within 3-5 days of discharge Complete  Complete  HRI or Home Care Consult Complete Complete Complete  SW Recovery Care/Counseling Consult Complete Complete Complete  Palliative Care Screening Complete Not Applicable Not Applicable  Skilled Nursing Facility Complete Not Applicable Patient Refused

## 2024-05-22 NOTE — Progress Notes (Signed)
 Received patient in bed to unit. (Bedside) Sedated on Ventilator Informed consent signed and in chart.   TX duration: 3 Hours  Patient tolerated well.   Alert, without acute distress. (Sedated on Ventilator) Hand-off given to patient's nurse. Dorn Sharps RN  Access used: Right arm AVF Access issues: None  Total UF removed: 2000 Medication(s) given: Per ICUN Post HD VS: T98.8 ax-HR-89-B/P 152/42 Post HD weight: 65.1 Per MARIUS Neville Seip, RN Kidney Dialysis Unit   05/22/24 0555  Vitals  Temp 98.8 F (37.1 C)  Temp Source Axillary  BP (!) 154/42  MAP (mmHg) 70  BP Location Other (Comment) (Arterial Line)  BP Method Automatic  Patient Position (if appropriate) Lying  Pulse Rate 89  Pulse Rate Source Monitor  ECG Heart Rate 89  Resp 14  Weight 65.1 kg (per ICUN)  Type of Weight Post-Dialysis  Oxygen  Therapy  SpO2 100 %  O2 Device Ventilator  During Treatment Monitoring  Blood Flow Rate (mL/min) 0 mL/min  Arterial Pressure (mmHg) 0.2 mmHg  Venous Pressure (mmHg) 0.8 mmHg  TMP (mmHg) -52.93 mmHg  Ultrafiltration Rate (mL/min) 1062 mL/min  Dialysate Flow Rate (mL/min) 0 ml/min  Dialysate Potassium Concentration 3  Dialysate Calcium  Concentration 3K  Duration of HD Treatment -hour(s) 3 hour(s)  Cumulative Fluid Removed (mL) per Treatment  2000.19  HD Safety Checks Performed Yes  Post Treatment  Dialyzer Clearance Lightly streaked  Liters Processed 63  Fluid Removed (mL) 2000 mL  Tolerated HD Treatment Yes  AVG/AVF Arterial Site Held (minutes) 10 minutes  AVG/AVF Venous Site Held (minutes) 10 minutes  Fistula / Graft Right Upper arm Arteriovenous fistula  Placement Date/Time: (c) 12/08/20 1403   Placed prior to admission: Yes  Orientation: Right  Access Location: Upper arm  Access Type: Arteriovenous fistula  Site Condition No complications  Fistula / Graft Assessment Present;Thrill;Bruit  Status Deaccessed;Flushed  Needle Size 15  Drainage Description  None

## 2024-05-22 NOTE — Progress Notes (Signed)
Pt transported to and from CT without event.  

## 2024-05-22 NOTE — Plan of Care (Signed)
  Problem: Skin Integrity: Goal: Risk for impaired skin integrity will decrease Outcome: Progressing   Problem: Nutrition: Goal: Risk of aspiration will decrease Outcome: Progressing   Problem: Safety: Goal: Non-violent Restraint(s) Outcome: Progressing

## 2024-05-23 DIAGNOSIS — D696 Thrombocytopenia, unspecified: Secondary | ICD-10-CM

## 2024-05-23 DIAGNOSIS — D509 Iron deficiency anemia, unspecified: Secondary | ICD-10-CM | POA: Diagnosis not present

## 2024-05-23 DIAGNOSIS — I619 Nontraumatic intracerebral hemorrhage, unspecified: Secondary | ICD-10-CM

## 2024-05-23 DIAGNOSIS — I120 Hypertensive chronic kidney disease with stage 5 chronic kidney disease or end stage renal disease: Secondary | ICD-10-CM | POA: Diagnosis not present

## 2024-05-23 DIAGNOSIS — I611 Nontraumatic intracerebral hemorrhage in hemisphere, cortical: Secondary | ICD-10-CM | POA: Diagnosis not present

## 2024-05-23 DIAGNOSIS — R29721 NIHSS score 21: Secondary | ICD-10-CM | POA: Diagnosis not present

## 2024-05-23 DIAGNOSIS — E878 Other disorders of electrolyte and fluid balance, not elsewhere classified: Secondary | ICD-10-CM

## 2024-05-23 DIAGNOSIS — I615 Nontraumatic intracerebral hemorrhage, intraventricular: Secondary | ICD-10-CM | POA: Diagnosis not present

## 2024-05-23 LAB — CBC
HCT: 25.5 % — ABNORMAL LOW (ref 36.0–46.0)
Hemoglobin: 7.8 g/dL — ABNORMAL LOW (ref 12.0–15.0)
MCH: 30.5 pg (ref 26.0–34.0)
MCHC: 30.6 g/dL (ref 30.0–36.0)
MCV: 99.6 fL (ref 80.0–100.0)
Platelets: 81 K/uL — ABNORMAL LOW (ref 150–400)
RBC: 2.56 MIL/uL — ABNORMAL LOW (ref 3.87–5.11)
RDW: 13.5 % (ref 11.5–15.5)
WBC: 4.8 K/uL (ref 4.0–10.5)
nRBC: 0 % (ref 0.0–0.2)

## 2024-05-23 LAB — GLUCOSE, CAPILLARY
Glucose-Capillary: 139 mg/dL — ABNORMAL HIGH (ref 70–99)
Glucose-Capillary: 123 mg/dL — ABNORMAL HIGH (ref 70–99)
Glucose-Capillary: 160 mg/dL — ABNORMAL HIGH (ref 70–99)
Glucose-Capillary: 151 mg/dL — ABNORMAL HIGH (ref 70–99)
Glucose-Capillary: 145 mg/dL — ABNORMAL HIGH (ref 70–99)

## 2024-05-23 LAB — BASIC METABOLIC PANEL WITH GFR
Anion gap: 17 — ABNORMAL HIGH (ref 5–15)
BUN: 29 mg/dL — ABNORMAL HIGH (ref 6–20)
CO2: 25 mmol/L (ref 22–32)
Calcium: 9.7 mg/dL (ref 8.9–10.3)
Chloride: 91 mmol/L — ABNORMAL LOW (ref 98–111)
Creatinine, Ser: 3.67 mg/dL — ABNORMAL HIGH (ref 0.44–1.00)
GFR, Estimated: 15 mL/min — ABNORMAL LOW (ref >60)
Glucose, Bld: 116 mg/dL — ABNORMAL HIGH (ref 70–99)
Potassium: 4.3 mmol/L (ref 3.5–5.1)
Sodium: 133 mmol/L — ABNORMAL LOW (ref 135–145)

## 2024-05-23 LAB — TRIGLYCERIDES: Triglycerides: 103 mg/dL (ref <150)

## 2024-05-23 MED ORDER — LIDOCAINE HCL (PF) 1 % IJ SOLN: 5.0000 mL | INTRAMUSCULAR | Status: DC | PRN

## 2024-05-23 MED ORDER — PENTAFLUOROPROP-TETRAFLUOROETH EX AERO: 1.0000 | INHALATION_SPRAY | CUTANEOUS | Status: DC | PRN

## 2024-05-23 MED ORDER — POLYETHYLENE GLYCOL 3350 17 G PO PACK
17.0000 g | PACK | Freq: Every day | ORAL | Status: DC
  Administered 2024-05-25: 17 g
  Filled 2024-05-23: qty 1

## 2024-05-23 MED ORDER — HYDRALAZINE HCL 50 MG PO TABS
50.0000 mg | ORAL_TABLET | Freq: Three times a day (TID) | ORAL | Status: DC
Start: 2024-05-23 — End: 2024-05-24
  Administered 2024-05-23 – 2024-05-24 (×4): 50 mg
  Filled 2024-05-23 (×5): qty 1

## 2024-05-23 MED ORDER — FENTANYL CITRATE PF 50 MCG/ML IJ SOSY
50.0000 ug | PREFILLED_SYRINGE | INTRAMUSCULAR | Status: DC | PRN
  Administered 2024-05-23 – 2024-05-24 (×6): 50 ug via INTRAVENOUS
  Filled 2024-05-23 (×7): qty 1

## 2024-05-23 MED ORDER — HYDRALAZINE HCL 20 MG/ML IJ SOLN
10.0000 mg | INTRAMUSCULAR | Status: DC | PRN
  Administered 2024-05-23 – 2024-05-31 (×5): 20 mg via INTRAVENOUS
  Administered 2024-06-02: 10 mg via INTRAVENOUS
  Administered 2024-06-03 – 2024-06-04 (×3): 20 mg via INTRAVENOUS
  Filled 2024-05-23 (×9): qty 1

## 2024-05-23 MED ORDER — ANTICOAGULANT SODIUM CITRATE 4% (200MG/5ML) IV SOLN: 5.0000 mL | Status: DC | PRN

## 2024-05-23 MED ORDER — HEPARIN SODIUM (PORCINE) 1000 UNIT/ML DIALYSIS: 1000.0000 [IU] | INTRAMUSCULAR | Status: DC | PRN

## 2024-05-23 MED ORDER — ALTEPLASE 2 MG IJ SOLR: 2.0000 mg | Freq: Once | INTRAMUSCULAR | Status: DC | PRN

## 2024-05-23 MED ORDER — DEXMEDETOMIDINE HCL IN NACL 400 MCG/100ML IV SOLN: 0.0000 ug/kg/h | INTRAVENOUS | Status: DC

## 2024-05-23 MED ORDER — LIDOCAINE-PRILOCAINE 2.5-2.5 % EX CREA: 1.0000 | TOPICAL_CREAM | CUTANEOUS | Status: DC | PRN

## 2024-05-23 MED ORDER — IOHEXOL 300 MG/ML  SOLN: 50.0000 mL | Freq: Once | INTRAMUSCULAR | Status: DC | PRN

## 2024-05-23 MED ORDER — NEPRO/CARBSTEADY PO LIQD: 237.0000 mL | ORAL | Status: DC | PRN

## 2024-05-23 MED ORDER — SODIUM CHLORIDE 0.9 % IV SOLN
1.0000 g | INTRAVENOUS | Status: DC
  Administered 2024-05-23 – 2024-05-26 (×4): 1 g via INTRAVENOUS
  Filled 2024-05-23 (×6): qty 10

## 2024-05-23 NOTE — Progress Notes (Signed)
   05/23/24 0851  Vitals  Temp 98.7 F (37.1 C) (auxillary)  Pulse Rate 93  Resp 18  BP (!) 167/51  SpO2 100 %  O2 Device Ventilator  Weight 45.2 kg  Type of Weight Post-Dialysis  Oxygen  Therapy  Patient Activity (if Appropriate) In bed  Pulse Oximetry Type Continuous  Oximetry Probe Site Changed No  Post Treatment  Dialyzer Clearance Lightly streaked  Hemodialysis Intake (mL) 0 mL  Liters Processed 77.6  Fluid Removed (mL) 3500 mL  Tolerated HD Treatment Yes  AVG/AVF Arterial Site Held (minutes) 5 minutes  AVG/AVF Venous Site Held (minutes) 5 minutes   Pt donhe at bedside--4N20 Intubated--.  Informed consent signed and in chart.   TX duration:3.15  Patient tolerated well.  No acute distress  Hand-off given to patient's nurse.   Access used: RUAF Access issues: no complications  Total UF removed: 3500 Medication(s) given: none   Delon LITTIE Engel [facility]

## 2024-05-23 NOTE — Plan of Care (Signed)
   Problem: Education: Goal: Ability to describe self-care measures that may prevent or decrease complications (Diabetes Survival Skills Education) will improve Outcome: Progressing Goal: Individualized Educational Video(s) Outcome: Progressing   Problem: Coping: Goal: Ability to adjust to condition or change in health will improve Outcome: Progressing   Problem: Fluid Volume: Goal: Ability to maintain a balanced intake and output will improve Outcome: Progressing   Problem: Metabolic: Goal: Ability to maintain appropriate glucose levels will improve Outcome: Progressing   Problem: Nutritional: Goal: Maintenance of adequate nutrition will improve Outcome: Progressing   Problem: Skin Integrity: Goal: Risk for impaired skin integrity will decrease Outcome: Progressing   Problem: Tissue Perfusion: Goal: Adequacy of tissue perfusion will improve Outcome: Progressing

## 2024-05-23 NOTE — Progress Notes (Signed)
 eLink Physician-Brief Progress Note Patient Name: [name] [name] DOB: 1980-11-03 MRN: [id number]   Date of Service  05/23/2024  HPI/Events of Note  RASS -4, but the patient appears to be uncomfortable  eICU Interventions  Add fentanyl  for CPOT scoring     Intervention Category Minor Interventions: Routine modifications to care plan (e.g. PRN medications for pain, fever)  [name] [name] 05/23/2024, 1:09 AM

## 2024-05-23 NOTE — Progress Notes (Signed)
 NAME:  [name] [name], MRN:  [id number], DOB:  12-28-1980, LOS: 4 ADMISSION DATE:  05/19/2024 CONSULTATION DATE:  9/15/20025 REFERRING MD: Patsey - EDP, CHIEF COMPLAINT:  Code Stroke, ICH   History of Present Illness:  43 year old woman who presented to [facility] ED 9/15 as a Code Stroke. 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 Recent admissions 6/11-7/28 and 7/29-8/13 for ICH.  Patient presented to [facility] ED from dialysis as a Code Stroke; reportedly LKW 1450 with development of obtundation and hemiparesis. SBP 200s. NIHSS 31 with initial leftward gaze, decreased LOC/disorientation, bilateral facial droop/arm weakness/leg weakness, global aphasia, dysarthria, visual neglect. CT Head demonstrated large volume IVH with moderate hydrocephalus, acute hemorrhage of medial R temporal lobe and small foci of residual/recurrent hemorrhage in the L cerebellar hemisphere.   Patient was seen by stroke team and neurosurgery, EVD was placed on left side, PCCM was consulted for medical evaluation and admission to neuro ICU  Pertinent Medical History:   Past Medical History:  Diagnosis Date   Anemia    Anxiety    DM (diabetes mellitus), type 1 (HCC)    ESRD on hemodialysis (HCC)    Gastroparesis    History of simultaneous kidney and pancreas transplant (HCC) 2014   Hypertension    Ischemic cerebrovascular accident (CVA) (HCC) 11/2021   Narcotic abuse (HCC)    Non compliance with medical treatment    Stroke [facility])    Vision loss    Significant Hospital Events: Including procedures, antibiotic start and stop dates in addition to other pertinent events   9/15 admitted with ICH plus IVH, EVD was placed for hydrocephalus 9/16 remain on clevidipine  infusion, tolerating spontaneous breathing trial, EVD is draining bloody fluid 9/17 continue to  require clevidipine  infusion, tolerated spontaneous breathing trial 9/18: No overnight issues. Currently off sedation. Still on high-dose `clevidipine  infusion  Interim History / Subjective:  9/19: this am, dialysis ongoing. Neurosx plans to continue ventriculostomy for now. Remains on cleviprex  to maintain BP goal. Tmax 100.1. despite plt decrease 4T score 0. Will maintain heparin  as is for now and closely follow trend.   Objective:  Blood pressure (!) 155/52, pulse 91, temperature 98.7 F (37.1 C), resp. rate 18, height 5' 3 (1.6 m), weight 48.7 kg, SpO2 100%.    Vent Mode: PRVC FiO2 (%):  [40 %] 40 % Set Rate:  [12 bmp] 12 bmp Vt Set:  [490 mL] 490 mL PEEP:  [5 cmH20] 5 cmH20 Pressure Support:  [8 cmH20] 8 cmH20 Plateau Pressure:  [16 cmH20-19 cmH20] 16 cmH20   Intake/Output Summary (Last 24 hours) at 05/23/2024 0810 Last data filed at 05/23/2024 0800 Gross per 24 hour  Intake 2364.81 ml  Output 198 ml  Net 2166.81 ml   Filed Weights   05/22/24 0500 05/22/24 1500 05/23/24 0405  Weight: 65.1 kg 48.2 kg 48.7 kg   Physical Examination: General: Crtitically ill-appearing, orally intubated, not responsive but just received sedation HEENT: , ventriculostomy in place, eyes anicteric.  ETT in place. Pupils pinpoint and minimally reactive.  Neuro: Opens eyes to painful stimuli, not following commands, antigravity on left side and R UE this am when RN was examining prior to fentanyl , withdrawing in bilateral lower extremities Chest: Coarse breath sounds, no wheezes or rhonchi Heart: Regular rate and rhythm, no murmurs or gallops Abdomen: Soft, nondistended, bowel sounds present.  J-tube in place   Labs  and images reviewed  Patient Lines/Drains/Airways Status     Active Line/Drains/Airways     Name Placement date Placement time Site Days   Arterial Line 05/19/24 Left Radial 05/19/24  2045  Radial  3   Peripheral IV 05/19/24 Anterior;Distal;Left Wrist 05/19/24  1859  Wrist  3    Peripheral IV 05/19/24 22 G 2.5 Left;Anterior Forearm 05/19/24  2251  Forearm  3   CVC Triple Lumen 05/20/24 Left Subclavian 05/20/24  1413  -- 2   Fistula / Graft Right Upper arm Arteriovenous fistula 12/08/20  1403  Upper arm  1261   Gastrostomy/Enterostomy PEG-jejunostomy 18 Fr. LUQ 05/21/24  0827  LUQ  1   ICP/Ventriculostomy Ventricular drainage catheter Left Parietal region 05/19/24  1730  Parietal region  3   Airway 7.5 mm 05/19/24  1842  -- 3   Wound 05/19/24 1933 Pressure Injury Buttocks Bilateral Stage 1 -  Intact skin with non-blanchable redness of a localized area usually over a bony prominence. 05/19/24  1933  Buttocks  3          Resolved Hospital Problem List:  Hypokalemia/hypophosphatemia  Assessment & Plan:  Acute right parietotemporal intraparenchymal hemorrhage with IVH with ICH score 2 due to uncontrolled hypertension Hypertensive emergency Obstructive hydrocephalus status post EVD Acute left parietal ischemic stroke was ruled out Acute encephalopathy in the setting of ICH and hydrocephalus Acute respiratory insufficiency Aspiration pneumonia End-stage renal disease on hemodialysis with failed renal transplant on immunosuppressive therapy Hypervolemic hyponatremia, hypochloremia  Type 1 diabetes, complicated with vasculopathy and gastroparesis Narcotic abuse Moderate protein calorie malnutrition Normocytic anemia thrombocytopenia  Continue neuro watch Stroke team and neurosurgery are following, greatly appreciated EVD remain at 10 cm water , draining minimal fluid Blood pressure still not well-controlled, requiring clevidipine  infusion Continue amlodipine , clonidine  and Coreg , already at escalated doses -will add hydralazine  -s/p hd this am -stop arb with renal dysfunction for now. If needed once maximized hydral will resume, esp if remaining on dialysis -cont titration of clevidipine  -Continue as needed labetalol  and prn hydral as well today Closely  monitor EVD output Minimize sedation with RASS goal 0/-1 Continue lung protective ventilation -patient on prn fentanyl  and continuous precedex , tolerating short sbt VAP prevention bundle in place Patient is having increased respiratory secretions, respiratory culture with gpc on gram stain Started on IV Unasyn  Nephrology is following, underwent scheduled hemodialysis this am 9/19 Continue mycophenolate  and prednisone  Closely monitor and supplement electrolytes -Blood sugars are slightly elevated, but with poor renal function will maintain therapy as is for now and closely monitor Continue tube feeds and dietary supplements -hgb levels appear chronically low, did have 0.5 gram drop overnight without overt source of bleed noted -platelets also depressed from previous normal prior to admission -> ~100->80 -4T score 0 at this time, follow values -no acute indication for transfusion at this time.    Labs:  CBC: Recent Labs  Lab 05/19/24 1610 05/19/24 1615 05/20/24 0456 05/20/24 1028 05/21/24 0543 05/22/24 0500 05/23/24 0445  WBC 4.8  --  5.1  --  6.5 7.3 4.8  NEUTROABS 4.2  --   --   --   --   --   --   HGB 12.2   < > 9.8* 9.2* 8.5* 8.9* 7.8*  HCT 38.5   < > 30.5* 27.0* 27.2* 27.5* 25.5*  MCV 98.2  --  96.8  --  100.0 97.9 99.6  PLT 114*  --  119*  --  101* 103* 81*   < > =  values in this interval not displayed.   Basic Metabolic Panel: Recent Labs  Lab 05/19/24 1610 05/19/24 1615 05/19/24 2058 05/20/24 0456 05/20/24 1028 05/21/24 0543 05/22/24 0500 05/23/24 0445  NA 138 139   < > 138 138 136 133* 133*  K 3.1* 3.0*   < > 2.7* 3.6 4.5 3.8 4.3  CL 95* 97*  --  95*  --  95* 91* 91*  CO2 24  --   --  31  --  26 27 25   GLUCOSE 147* 154*  --  126*  --  117* 136* 116*  BUN 10 9  --  14  --  21* 12 29*  CREATININE 2.80* 2.90*  --  3.55*  --  4.76* 2.23* 3.67*  CALCIUM  9.3  --   --  9.7  --  9.4 9.0 9.7  MG  --   --   --  2.4  --   --  2.1  --   PHOS  --   --   --  2.4*  --   6.4* 3.2  --    < > = values in this interval not displayed.   GFR: Estimated Creatinine Clearance: 15.4 mL/min (A) (by C-G formula based on SCr of 3.67 mg/dL (H)). Recent Labs  Lab 05/20/24 0456 05/21/24 0543 05/22/24 0500 05/23/24 0445  WBC 5.1 6.5 7.3 4.8   Liver Function Tests: Recent Labs  Lab 05/19/24 1610 05/21/24 0543  AST 23  --   ALT 19  --   ALKPHOS 88  --   BILITOT 0.9  --   PROT 7.3  --   ALBUMIN 3.6 2.7*   No results for input(s): LIPASE, AMYLASE in the last 168 hours. No results for input(s): AMMONIA in the last 168 hours.  ABG:    Component Value Date/Time   PHART 7.470 (H) 05/20/2024 1028   PCO2ART 45.3 05/20/2024 1028   PO2ART 196 (H) 05/20/2024 1028   HCO3 32.9 (H) 05/20/2024 1028   TCO2 34 (H) 05/20/2024 1028   ACIDBASEDEF 5.2 (H) 01/13/2015 0300   O2SAT 100 05/20/2024 1028   Coagulation Profile: Recent Labs  Lab 05/19/24 1610 05/20/24 0456  INR 1.1 1.1   Cardiac Enzymes: No results for input(s): CKTOTAL, CKMB, CKMBINDEX, TROPONINI in the last 168 hours.  HbA1C: Hgb A1c MFr Bld  Date/Time Value Ref Range Status  05/20/2024 04:56 AM 3.6 (L) 4.8 - 5.6 % Final    Comment:    (NOTE) Diagnosis of Diabetes The following HbA1c ranges recommended by the American Diabetes Association (ADA) may be used as an aid in the diagnosis of diabetes mellitus.  Hemoglobin             Suggested A1C NGSP%              Diagnosis  <5.7                   Non Diabetic  5.7-6.4                Pre-Diabetic  >6.4                   Diabetic  <7.0                   Glycemic control for                       adults with diabetes.    02/14/2024 09:49 AM 4.2 (L) 4.8 -  5.6 % Final    Comment:    (NOTE) Diagnosis of Diabetes The following HbA1c ranges recommended by the American Diabetes Association (ADA) may be used as an aid in the diagnosis of diabetes mellitus.  Hemoglobin             Suggested A1C NGSP%               Diagnosis  <5.7                   Non Diabetic  5.7-6.4                Pre-Diabetic  >6.4                   Diabetic  <7.0                   Glycemic control for                       adults with diabetes.     CBG: Recent Labs  Lab 05/22/24 1611 05/22/24 1938 05/22/24 2354 05/23/24 0331 05/23/24 0741  GLUCAP 180* 125* 152* 139* 123*   The patient is critically ill due to acute right parietotemporal intraparenchymal hemorrhage with IVH in the setting of hypertensive emergency.  Critical care was necessary to treat or prevent imminent or life-threatening deterioration.  Critical care was time spent personally by me on the following activities: development of treatment plan with patient and/or surrogate as well as nursing, discussions with consultants, evaluation of patient's response to treatment, examination of patient, obtaining history from patient or surrogate, ordering and performing treatments and interventions, ordering and review of laboratory studies, ordering and review of radiographic studies, pulse oximetry, re-evaluation of patient's condition and participation in multidisciplinary rounds.   During this encounter critical care time was devoted to patient care services described in this note for 37 minutes.    Harlene Na DO [facility] 05/23/2024, 8:11 AM See Amion for pager If no response to pager, please call [phone] until 1900 After 1900 please call [facility] [phone]

## 2024-05-23 NOTE — Progress Notes (Signed)
 Subjective: The patient is intubated and being dialyzed.  She is much more alert this morning.  She is in no apparent distress.  Objective: Vital signs in last 24 hours: Temp:  [98 F (36.7 C)-100.1 F (37.8 C)] 99.4 F (37.4 C) (09/19 0600) Pulse Rate:  [82-95] (P) 90 (09/19 0732) Resp:  [11-34] (P) 20 (09/19 0732) BP: (115-131)/(55-87) (P) 114/57 (09/19 0732) SpO2:  [94 %-100 %] (P) 100 % (09/19 0732) Arterial Line BP: (127-183)/(39-70) 159/49 (09/19 0647) FiO2 (%):  [40 %] 40 % (09/19 0439) Weight:  [48.2 kg-48.7 kg] 48.7 kg (09/19 0405) Estimated body mass index is 19.02 kg/m as calculated from the following:   Height as of this encounter: 5' 3 (1.6 m).   Weight as of this encounter: 48.7 kg.   Intake/Output from previous day: 09/18 0701 - 09/19 0700 In: 2376 [I.V.:871; NG/GT:1105; IV Piggyback:270.1] Out: 200 [Drains:200] Intake/Output this shift: Total I/O In: 9.3 [I.V.:3.5; NG/GT:5.8] Out: -   Physical exam the patient is alert and tracks.  She is semipurposeful and moving all 4 extremities.  She does not follow commands.  Lab Results: Recent Labs    05/22/24 0500 05/23/24 0445  WBC 7.3 4.8  HGB 8.9* 7.8*  HCT 27.5* 25.5*  PLT 103* 81*   BMET Recent Labs    05/22/24 0500 05/23/24 0445  NA 133* 133*  K 3.8 4.3  CL 91* 91*  CO2 27 25  GLUCOSE 136* 116*  BUN 12 29*  CREATININE 2.23* 3.67*  CALCIUM  9.0 9.7    Studies/Results: CT HEAD WO CONTRAST ( ) Result Date: 05/22/2024 CLINICAL DATA:  43 year old female dialysis patient with a history of intracranial hemorrhages, previous right EVD. Code stroke presentation on May 24, 2024 with acute intraventricular hemorrhage and ventriculomegaly. EXAM: CT HEAD WITHOUT CONTRAST TECHNIQUE: Contiguous axial images were obtained from the base of the skull through the vertex without intravenous contrast. RADIATION DOSE REDUCTION: This exam was performed according to the departmental dose-optimization program which  includes automated exposure control, adjustment of the mA and/or kV according to patient size and/or use of iterative reconstruction technique. COMPARISON:  Brain MRI yesterday.  Head CTs 2024-05-24 and earlier. FINDINGS: Brain: Left superior frontal approach EVD terminating just to the left of midline appears stable since 05/24/2024. Moderate to large volume of intraventricular hemorrhage, not significantly changed since 2220 hours on 2024/05/24. Ventriculomegaly has regressed, substantially since 1618 hours that day. Small volume parenchymal and extra-axial blood along the course of the ventriculostomy is stable. Basilar cisterns remain patent. No midline shift. Stable gray-white matter differentiation throughout the brain. Chronic encephalomalacia left cerebellum PICA territory. Chronic encephalomalacia left parietal lobe. Confluent bilateral white matter hypodensity otherwise. Vascular: Advanced Calcified atherosclerosis at the skull base. Skull: Stable vertex burr holes. No acute osseous abnormality identified. Sinuses/Orbits: Intubated. Middle ear and mastoid opacification, paranasal sinuses remain well aerated. Other: Left vertex EVD. No acute orbit or scalp soft tissue finding. Calcified scalp vessel atherosclerosis. IMPRESSION: 1. Stable left EVD. Regressed ventriculomegaly but no significant change in moderate to large volume of intraventricular hemorrhage since May 24, 2024 - most pronounced in the right lateral ventricle. 2. No new intracranial abnormality. Underlying chronic ischemic and small vessel disease. Electronically Signed   By: VEAR Hurst M.D.   On: 05/22/2024 10:38   IR GJ Tube Change Result Date: 05/21/2024 INDICATION: Catheter malfunctioning. Feeding tube dependent, altered mental status. EXAM: GASTROJEJUNOSTOMY TUBE EXCHANGE COMPARISON:  IR FLUOROSCOPY, 02/19/2024 and 04/15/2024. KUB, 04/01/2024. MEDICATIONS: None. CONTRAST:  100 mL Omnipaque  300 ANESTHESIA/SEDATION: Local anesthetic  and  single agent sedation was employed during this procedure. A total of Versed  1.0 mg was administered intravenously. The patient's level of consciousness and vital signs were monitored continuously by radiology nursing throughout the procedure under my direct supervision. FLUOROSCOPY: Radiation Exposure Index and estimated peak skin dose (PSD); Reference air kerma (RAK), 73.3 mGy. COMPLICATIONS: None. PROCEDURE: Informed written consent was obtained from the patient and/or patient's representative after a discussion of the risks and benefits. The upper abdomen and the external portion of the existing gastrojejunostomy tube was prepped and draped in the usual sterile fashion, and a sterile drape was applied covering the operative field. Maximum barrier sterile technique with sterile gowns and gloves were used for the procedure. A timeout was performed prior to the initiation of the procedure. The existing 18 Fr small bore gastrojejunostomy had refluxed into the stomach. The feeding tube was cannulated with a stiff Glidewire then removed, a 5 Fr Kumpe catheter was then manipulated into the proximal aspect of the small bowel. Contrast injection confirmed appropriate positioning. Under intermittent fluoroscopic guidance, a new 18 Fr large-bore gastrojejunostomy catheter was advanced with tip ultimately terminating within the proximal small bowel. Contrast injection via the jejunostomy and gastric lumens confirmed appropriate functioning and positioning. A dressing was placed. The patient tolerated procedure well without immediate postprocedural complication. IMPRESSION: Successful fluoroscopic guided exchange of a refluxed, small bore 18 Fr tube for a new large bore 18 Fr feeding tube. The tip of the jejunostomy portion of the catheter lies within the proximal jejunum. Both lumens ready for immediate use. RECOMMENDATIONS: The patient will return to [facility]iology (V[facility]) for routine feeding tube  evaluation and exchange in 6 months. Thom Hall, MD [facility] Electronically Signed   By: Thom Hall M.D.   On: 05/21/2024 15:37    Assessment/Plan: Cerebral hemorrhage, intraventricular hemorrhage, ventriculostomy: The patient is much better this morning.  We will continue her ventriculostomy for now.  LOS: 4 days     [name] 05/23/2024, 7:35 AM     Patient ID: [name] [name], female   DOB: [birth year], 43 y.o.   MRN: [id number]

## 2024-05-23 NOTE — Progress Notes (Addendum)
 PT OT.  STROKE TEAM PROGRESS NOTE    SIGNIFICANT HOSPITAL EVENTS June 02, 2024- Admitted with large ICH, IVH. Intubated, EVD placed 9/19 - remains off sedation   INTERIM HISTORY/SUBJECTIVE Intubated, Precedex  infusing.  Palliative care on board, patient family would like full code and full scope.  CT repeat showed decreased hydrocephalus. Discussed trach with CCM Continue EVD for now, nsgy following   OBJECTIVE  CBC    Component Value Date/Time   WBC 4.8 05/23/2024 0445   RBC 2.56 (L) 05/23/2024 0445   HGB 7.8 (L) 05/23/2024 0445   HCT 25.5 (L) 05/23/2024 0445   PLT 81 (L) 05/23/2024 0445   MCV 99.6 05/23/2024 0445   MCH 30.5 05/23/2024 0445   MCHC 30.6 05/23/2024 0445   RDW 13.5 05/23/2024 0445   LYMPHSABS 0.4 (L) 2024/06/02 1610   MONOABS 0.2 06/02/2024 1610   EOSABS 0.0 06-02-24 1610   BASOSABS 0.0 2024/06/02 1610    BMET    Component Value Date/Time   NA 133 (L) 05/23/2024 0445   K 4.3 05/23/2024 0445   CL 91 (L) 05/23/2024 0445   CO2 25 05/23/2024 0445   GLUCOSE 116 (H) 05/23/2024 0445   BUN 29 (H) 05/23/2024 0445   CREATININE 3.67 (H) 05/23/2024 0445   CALCIUM  9.7 05/23/2024 0445   GFRNONAA 15 (L) 05/23/2024 0445    IMAGING past 24 hours CT HEAD WO CONTRAST ( ) Result Date: 05/22/2024 CLINICAL DATA:  43 year old female dialysis patient with a history of intracranial hemorrhages, previous right EVD. Code stroke presentation on 06-02-2024 with acute intraventricular hemorrhage and ventriculomegaly. EXAM: CT HEAD WITHOUT CONTRAST TECHNIQUE: Contiguous axial images were obtained from the base of the skull through the vertex without intravenous contrast. RADIATION DOSE REDUCTION: This exam was performed according to the departmental dose-optimization program which includes automated exposure control, adjustment of the mA and/or kV according to patient size and/or use of iterative reconstruction technique. COMPARISON:  Brain MRI yesterday.  Head CTs 2024/06/02 and earlier.  FINDINGS: Brain: Left superior frontal approach EVD terminating just to the left of midline appears stable since 06-02-24. Moderate to large volume of intraventricular hemorrhage, not significantly changed since 2220 hours on 06/02/2024. Ventriculomegaly has regressed, substantially since 1618 hours that day. Small volume parenchymal and extra-axial blood along the course of the ventriculostomy is stable. Basilar cisterns remain patent. No midline shift. Stable gray-white matter differentiation throughout the brain. Chronic encephalomalacia left cerebellum PICA territory. Chronic encephalomalacia left parietal lobe. Confluent bilateral white matter hypodensity otherwise. Vascular: Advanced Calcified atherosclerosis at the skull base. Skull: Stable vertex burr holes. No acute osseous abnormality identified. Sinuses/Orbits: Intubated. Middle ear and mastoid opacification, paranasal sinuses remain well aerated. Other: Left vertex EVD. No acute orbit or scalp soft tissue finding. Calcified scalp vessel atherosclerosis. IMPRESSION: 1. Stable left EVD. Regressed ventriculomegaly but no significant change in moderate to large volume of intraventricular hemorrhage since 02-Jun-2024 - most pronounced in the right lateral ventricle. 2. No new intracranial abnormality. Underlying chronic ischemic and small vessel disease. Electronically Signed   By: VEAR Hurst M.D.   On: 05/22/2024 10:38    Vitals:   05/23/24 0745 05/23/24 0800 05/23/24 0815 05/23/24 0830  BP: (!) 161/66 (!) 155/52 (!) 161/50 (!) 175/55  Pulse: 89 91 98 95  Resp: 16 18 16  (!) 23  Temp:      TempSrc:      SpO2: 100% 100% 93% 98%  Weight:      Height:         PHYSICAL EXAM  General: Largely unresponsive CV: Regular rate and rhythm on monitor Respiratory: Mechanically ventilated GI: GJ tube in place   NEURO:  Mental Status: Eyes open with noxious stimuli, not following commands. Appears to intermittently mouth words. Tracks  examiner  Cranial Nerves:  II: PERRL.  III, IV, VI: Gaze disconjugate, comes to midline  VII: Face is symmetrical resting  VIII:  IX, X: Cough and gag intact XI: Head is midline XII: tongue is midline without fasciculations. Motor: Withdraws to noxious stimuli, decerebrate posturing on the left. No purposeful movement noted.  Tone: is normal and bulk is normal Coordination deferred Gait- deferred  Most Recent NIH 21  1a Level of Conscious:1 1b LOC Questions: 2 1c LOC Commands: 2 2 Best Gaze: 1 3 Visual: 0 4 Facial Palsy: 0 5a Motor Arm - left: 2 5b Motor Arm - Right: 2 6a Motor Leg - Left: 3 6b Motor Leg - Right: 2 7 Limb Ataxia: 0 8 Sensory: 1 9 Best Language: 3 10 Dysarthria:UN 11 Extinct. and Inattention:2 TOTAL:21     ASSESSMENT/PLAN  [name] is a 43 y.o. female with history of hypertension, ESRD on HD status post renal and pancreatic transplants, diabetes type 1, gastroparesis, multiple strokes both ischemic and hemorrhagic along with prepontine subarachnoid hemorrhage which was angio negative, and during last admission had hydrocephalus requiring EVD placement, then discharged to a nursing facility-today had a change in mentation at dialysis where she had a leftward gaze and left-sided weakness for which EMS was called.Her blood pressures were in the systolic 200s.    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 Acute hemorrhage in the medial right temporal lobe, inseparable from the intraventricular hemorrhage. Small foci of residual/recurrent hemorrhage and/or mineralization in the left cerebellar hemisphere. 9/15- repeat CT head - No significant change in large volume of acute intraventricular hemorrhage and moderate hydrocephalus. Similar intraparenchymal hemorrhage in the right hippocampal region that is inseparable  from the intraventricular hemorrhage.  MRI   Intraventricular hemorrhage with ventriculomegaly and left frontal ventriculostomy tube in place. No acute ischemic infarct. 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 MRA motion-degraded, no significant stenosis CT repeat 9/18 showed regressed ventriculomegaly, stable ICH and IVH 2D Echo EF 60-65% in 03/2024 LDL 38 HgbA1c 3.6 VTE prophylaxis - SCDs No antithrombotic prior to admission, now on No antithrombotic Therapy recommendations:  Pending Disposition:  Pending, palliative care on board, full code and full scope  Hx of ICH 02/2024- Perimesencephalic SAH with IVH and hydrocephalus.  Status post EVD.  Cerebral angiogram no aneurysm or AVM but diffuse anterior circulation vasospasm.  MRI [place] small couple callosum and cerebellar infarct.  EF 60 to 65%, LDL 63, A1c 4.2.  EEG sharp waves, concerning for seizure activity, put on Vimpat .  Patient has prolonged hospitalization and enterally discharged to SNF.  Hx of Seizures Concerning for seizure during last admission for [facility] and IVH. Discharged on lacosamide   EEG generalized periodic discharges with triphasic morphology. This EEG pattern can be seen due to toxic-metabolic causes. However, due to patient's complicated history, can also be on the ictal-interictal continuum but with low suspicion for seizure recurrence.  Continue home lacosamide  100 mg twice daily  Obstructive hydrocephalus EVD in place at 10 cm of water  Management per NSGY CT and MRI no significant midline shift CT repeat 9/18 showed regressed  ventriculomegaly, stable IVH  Respiratory failure  Obtunded on arrival to ED, intubated by CCM SBT/VAP per CCM PRN Fentanyl   Appreciate CCM assistance  Hypertension Home meds:  Hydralazine , Amlodipine  and clonidine   On Cleviprex   BP goal less than 160 On amlodipine  10 and clonidine  0.3 tid and coreg  25 bid And losartan   9/18 Wean off Cleviprex  as able  Diabetes type I Controlled Hx of pancreas transplant Home meds:  Cellcept , prednisone , and cyclosporine   HgbA1c 3.6, goal < 7.0 CBGs SSI Recommend close follow-up with PCP for better DM control  ESRD on HD Hx of renal transplant (failed) Home meds: Cellcept , prednisone , and cyclosporine   Creatinine 2.80--3.55--4.76--2.23 HD schedule is MWF Nephrology consulted  Dysphagia Patient has GJ tube, however GJ tube clogged -> replaced by IR 9/17 On TF @ 50  Other stroke risk factors  Other medical issues Hypokalemia, K 3.1--2.8-3.6--4.5--3.8  Hospital day # 4   Patient seen and examined by NP/APP with MD. MD to update note as needed.   Jorene Last, DNP, FNP-BC [name] Pager: [phone]  ATTENDING NOTE: I reviewed above note and agree with the assessment and plan. Pt was seen and examined.   No family at bedside.  Patient still intubated, opening eyes with repetitive stimulation.  1 time she seems mouth wording with yes, but otherwise not interactive.  Progressive movement bilateral upper extremities against gravity.  Withdraws to pain bilateral lower extremities.  Patient EVD patent, neurologically unchanged.  Likely need tracheostomy for LTAC placement.  Still on Cleviprex , and also on multiple BP p.o. meds, taper off Cleviprex  as able.  Palliative care on board, patient full code and full scope at this time.  For detailed assessment and plan, please refer to above as I have made changes wherever appropriate.   Ary Cummins, MD PhD Stroke Neurology 05/23/2024 5:21 PM  This patient is critically ill due to recurrent ICH, IVH, hydrocephalus status post EVD placement, respiratory failure and at significant risk of neurological worsening, death form obstructive hydrocephalus, increased ICP, recurrent ICH, sepsis. This patient's care requires constant monitoring of vital signs, hemodynamics, respiratory and cardiac monitoring,  review of multiple databases, neurological assessment, discussion with family, other specialists and medical decision making of high complexity. I spent 35 minutes of neurocritical care time in the care of this patient.    To contact Stroke Continuity provider, please refer to [email or web address]. After hours, contact General Neurology

## 2024-05-23 NOTE — Progress Notes (Signed)
 [facility] Progress Note  Subjective:  Seen in ICU Was on HD this am  Vitals:   05/23/24 1100 05/23/24 1115 05/23/24 1130 05/23/24 1156  BP: (!) 115/59     Pulse: 97 98 99   Resp: 18 18 (!) 21   Temp:      TempSrc:      SpO2: 100% 100% 100% 94%  Weight:      Height:        Exam: Gen on vent, sedated, EVD in place  Sclera anicteric, throat w/ ETT No jvd or bruits Chest clear anterior/ lateral RRR no MRG Abd soft ntnd no mass or ascites +bs GU defer Ext no LE or UE edema Neuro is on vent, sedated   RUE AVF+bruit    Home bp meds: Norvasc  10 every day Catapres  patch 0.3mg  weekly Hydralazine  75mg  tid Metoprolol  50mg  bid    OP HD: [facility] MWF 3h  B400  55.2kg  2K bath   AVF  Heparin  none Last OP HD 9/15, about 2h 50 min then LOC Mircera 150 mcg q 2 wks, last 9/10, due 9/24       Assessment/ Plan: Acute R IC bleed: d/t uncont HTN. On vent, per neuro/ CCM.  ESRD: on HD MWF. Had HD here Wed night, also having HD this am. Next HD Monday.   HTN: per CCM, remains on IV clevidipine  + norvasc / coreg / clonidine / hydralazine  per tube Volume: several kg under dry wt, will need lower dry wt at dc. Euvolemic on exam. Follow.  Anemia of esrd: Hb 10-12 here, follow.  VDRF: per CCM H/o pancreas/ renal transplant: renal transplant has failed some time ago, pancreas is still working, taking CyA, cellcept  and prednisone .     Myer Fret MD  [facility] 05/23/2024, 12:47 PM  Recent Labs  Lab 05/19/24 1610 05/19/24 1615 05/21/24 0543 05/22/24 0500 05/23/24 0445  HGB 12.2   < > 8.5* 8.9* 7.8*  ALBUMIN 3.6  --  2.7*  --   --   CALCIUM  9.3   < > 9.4 9.0 9.7  PHOS  --    < > 6.4* 3.2  --   CREATININE 2.80*   < > 4.76* 2.23* 3.67*  K 3.1*   < > 4.5 3.8 4.3   < > = values in this interval not displayed.   No results for input(s): IRON , TIBC, FERRITIN in the last 168 hours. Inpatient medications:  amLODipine   10 mg Per Tube Daily   carvedilol   25 mg Per Tube  BID WC   Chlorhexidine  Gluconate Cloth  6 each Topical Q0600   cloNIDine   0.3 mg Per Tube TID   cycloSPORINE   200 mg Per Tube QPM   cycloSPORINE   225 mg Per Tube Daily   feeding supplement (PROSource TF20)  60 mL Per Tube Daily   heparin  injection (subcutaneous)  5,000 Units Subcutaneous Q12H   hydrALAZINE   50 mg Per Tube Q8H   insulin  aspart  0-9 Units Subcutaneous Q4H   mycophenolate   500 mg Per Tube BID   mouth rinse  15 mL Mouth Rinse Q2H   pantoprazole  (PROTONIX ) IV  40 mg Intravenous QHS   polyethylene glycol  17 g Per Tube Daily   predniSONE   15 mg Per Tube Q breakfast   sennosides  5 mL Per Tube BID    ampicillin -sulbactam (UNASYN ) IV Stopped (05/22/24 2240)   anticoagulant sodium citrate      clevidipine  22 mg/hr (05/23/24 1000)   dexmedetomidine  (PRECEDEX ) IV infusion  Stopped (05/23/24 0916)   feeding supplement (OSMOLITE 1.5 CAL) 50 mL/hr at 05/23/24 1000   lacosamide  (VIMPAT ) IV Stopped (05/23/24 0951)   acetaminophen  **OR** acetaminophen  (TYLENOL ) oral liquid 160 mg/5 mL **OR** acetaminophen , alteplase , anticoagulant sodium citrate , docusate sodium , feeding supplement (NEPRO CARB STEADY), fentaNYL  (SUBLIMAZE ) injection, heparin , hydrALAZINE , iohexol , labetalol , lidocaine  (PF), lidocaine -prilocaine , mouth rinse, pentafluoroprop-tetrafluoroeth

## 2024-05-24 DIAGNOSIS — D696 Thrombocytopenia, unspecified: Secondary | ICD-10-CM | POA: Diagnosis not present

## 2024-05-24 DIAGNOSIS — J1569 Pneumonia due to other gram-negative bacteria: Secondary | ICD-10-CM

## 2024-05-24 DIAGNOSIS — E878 Other disorders of electrolyte and fluid balance, not elsewhere classified: Secondary | ICD-10-CM | POA: Diagnosis not present

## 2024-05-24 DIAGNOSIS — I619 Nontraumatic intracerebral hemorrhage, unspecified: Secondary | ICD-10-CM | POA: Diagnosis not present

## 2024-05-24 DIAGNOSIS — D509 Iron deficiency anemia, unspecified: Secondary | ICD-10-CM | POA: Diagnosis not present

## 2024-05-24 LAB — CBC
HCT: 26.8 % — ABNORMAL LOW (ref 36.0–46.0)
Hemoglobin: 8.3 g/dL — ABNORMAL LOW (ref 12.0–15.0)
MCH: 30.6 pg (ref 26.0–34.0)
MCHC: 31 g/dL (ref 30.0–36.0)
MCV: 98.9 fL (ref 80.0–100.0)
Platelets: 100 K/uL — ABNORMAL LOW (ref 150–400)
RBC: 2.71 MIL/uL — ABNORMAL LOW (ref 3.87–5.11)
RDW: 13.7 % (ref 11.5–15.5)
WBC: 4.1 K/uL (ref 4.0–10.5)
nRBC: 0 % (ref 0.0–0.2)

## 2024-05-24 LAB — GLUCOSE, CAPILLARY
Glucose-Capillary: 124 mg/dL — ABNORMAL HIGH (ref 70–99)
Glucose-Capillary: 137 mg/dL — ABNORMAL HIGH (ref 70–99)
Glucose-Capillary: 147 mg/dL — ABNORMAL HIGH (ref 70–99)
Glucose-Capillary: 149 mg/dL — ABNORMAL HIGH (ref 70–99)
Glucose-Capillary: 155 mg/dL — ABNORMAL HIGH (ref 70–99)
Glucose-Capillary: 164 mg/dL — ABNORMAL HIGH (ref 70–99)

## 2024-05-24 MED ORDER — HYDRALAZINE HCL 50 MG PO TABS
50.0000 mg | ORAL_TABLET | Freq: Once | ORAL | Status: AC
  Administered 2024-05-24: 50 mg
  Filled 2024-05-24: qty 1

## 2024-05-24 MED ORDER — HYDRALAZINE HCL 50 MG PO TABS
100.0000 mg | ORAL_TABLET | Freq: Three times a day (TID) | ORAL | Status: DC
  Administered 2024-05-24 – 2024-05-29 (×11): 100 mg
  Filled 2024-05-24 (×11): qty 2

## 2024-05-24 NOTE — Progress Notes (Signed)
 NAME:  [name] [name], MRN:  [id number], DOB:  May 03, 1981, LOS: 5 ADMISSION DATE:  05/19/2024 CONSULTATION DATE:  9/15/20025 REFERRING MD: Patsey - EDP, CHIEF COMPLAINT:  Code Stroke, ICH   History of Present Illness:  43 year old woman who presented to [facility] ED 9/15 as a Code Stroke. 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 Recent admissions 6/11-7/28 and 7/29-8/13 for ICH.  Patient presented to [facility] ED from dialysis as a Code Stroke; reportedly LKW 1450 with development of obtundation and hemiparesis. SBP 200s. NIHSS 31 with initial leftward gaze, decreased LOC/disorientation, bilateral facial droop/arm weakness/leg weakness, global aphasia, dysarthria, visual neglect. CT Head demonstrated large volume IVH with moderate hydrocephalus, acute hemorrhage of medial R temporal lobe and small foci of residual/recurrent hemorrhage in the L cerebellar hemisphere.   Patient was seen by stroke team and neurosurgery, EVD was placed on left side, PCCM was consulted for medical evaluation and admission to neuro ICU  Pertinent Medical History:   Past Medical History:  Diagnosis Date   Anemia    Anxiety    DM (diabetes mellitus), type 1 (HCC)    ESRD on hemodialysis (HCC)    Gastroparesis    History of simultaneous kidney and pancreas transplant (HCC) 2014   Hypertension    Ischemic cerebrovascular accident (CVA) (HCC) 11/2021   Narcotic abuse (HCC)    Non compliance with medical treatment    Stroke [facility])    Vision loss    Significant Hospital Events: Including procedures, antibiotic start and stop dates in addition to other pertinent events   9/15 admitted with ICH plus IVH, EVD was placed for hydrocephalus 9/16 remain on clevidipine  infusion, tolerating spontaneous breathing trial, EVD is draining bloody fluid 9/17 continue to  require clevidipine  infusion, tolerated spontaneous breathing trial 9/18: No overnight issues. Currently off sedation. Still on high-dose `clevidipine  infusion 9/19: this am, dialysis ongoing. Neurosx plans to continue ventriculostomy for now. Remains on cleviprex  to maintain BP goal. Tmax 100.1. despite plt decrease 4T score 0. Will maintain heparin  as is for now and closely follow trend.   Interim History / Subjective:  9/20: no events overnight. Pt remains on cleviprex  gtt will increase hydral. Will likely need trach for any rehab potential as inability to protect airway and agitation preventing adequate wean with sat/sbt  Objective:  Blood pressure (!) 108/58, pulse 86, temperature 100 F (37.8 C), temperature source Axillary, resp. rate (!) 24, height 5' 3 (1.6 m), weight 49.4 kg, SpO2 100%.    Vent Mode: PRVC FiO2 (%):  [40 %] 40 % Set Rate:  [12 bmp] 12 bmp Vt Set:  [490 mL] 490 mL PEEP:  [5 cmH20] 5 cmH20 Plateau Pressure:  [16 cmH20-21 cmH20] 21 cmH20   Intake/Output Summary (Last 24 hours) at 05/24/2024 1029 Last data filed at 05/24/2024 1000 Gross per 24 hour  Intake 2270.1 ml  Output 215 ml  Net 2055.1 ml   Filed Weights   05/23/24 0405 05/23/24 0851 05/24/24 0424  Weight: 48.7 kg 45.2 kg 49.4 kg   Physical Examination: General: Crtitically ill-appearing, orally intubated, not responsive with exception to painful stim on R UE HEENT: [place], ventriculostomy in place, eyes anicteric.  ETT in place. Pupils pinpoint and minimally reactive.  Neuro: Opens eyes to painful stimuli, not following commands, antigravity to painful stim on R UE only Chest: Coarse breath sounds, no wheezes or rhonchi Heart: Regular  rate and rhythm, no murmurs or gallops Abdomen: Soft, nondistended, bowel sounds present.  J-tube in place   Labs and images reviewed  Patient Lines/Drains/Airways Status     Active Line/Drains/Airways     Name Placement date Placement time Site Days   Arterial Line  05/19/24 Left Radial 05/19/24  2045  Radial  3   Peripheral IV 05/19/24 Anterior;Distal;Left Wrist 05/19/24  1859  Wrist  3   Peripheral IV 05/19/24 22 G 2.5 Left;Anterior Forearm 05/19/24  2251  Forearm  3   CVC Triple Lumen 05/20/24 Left Subclavian 05/20/24  1413  -- 2   Fistula / Graft Right Upper arm Arteriovenous fistula 12/08/20  1403  Upper arm  1261   Gastrostomy/Enterostomy PEG-jejunostomy 18 Fr. LUQ 05/21/24  0827  LUQ  1   ICP/Ventriculostomy Ventricular drainage catheter Left Parietal region 05/19/24  1730  Parietal region  3   Airway 7.5 mm 05/19/24  1842  -- 3   Wound 05/19/24 1933 Pressure Injury Buttocks Bilateral Stage 1 -  Intact skin with non-blanchable redness of a localized area usually over a bony prominence. 05/19/24  1933  Buttocks  3          Resolved Hospital Problem List:  Hypokalemia/hypophosphatemia  Assessment & Plan:  Acute right parietotemporal intraparenchymal hemorrhage with IVH with ICH score 2 due to uncontrolled hypertension Hypertensive emergency Obstructive hydrocephalus status post EVD Acute left parietal ischemic stroke was ruled out Acute encephalopathy in the setting of ICH and hydrocephalus Acute respiratory insufficiency Serratia pneumonia End-stage renal disease on hemodialysis with failed renal transplant on immunosuppressive therapy Hypervolemic hyponatremia, hypochloremia  Type 1 diabetes, complicated with vasculopathy and gastroparesis Narcotic abuse Moderate protein calorie malnutrition Normocytic anemia thrombocytopenia  Continue neuro watch Stroke team and neurosurgery are following, greatly appreciated EVD remain at 10 cm water , draining ~200/24hr Blood pressure still not well-controlled, requiring clevidipine  infusion Continue amlodipine , clonidine  and Coreg , already at escalated doses -will increase hydralazine  dose -cont titration of clevidipine  -Continue as needed labetalol  and prn hydral as well today Closely  monitor EVD output Minimize sedation with RASS goal 0/-1 Continue lung protective ventilation -patient on prn fentanyl  and intermittently precedex  (minimized as able) -VAP prevention bundle in place, definite inability to clear secretions or maintain sbt of any meaningful duration. -Patient is having increased respiratory secretions, respiratory culture with serratia marcenscens -changed abx to cefepime  9/19 Nephrology is following, underwent scheduled hemodialysis this am 9/19 Continue mycophenolate  and prednisone  Closely monitor and supplement electrolytes -Blood sugars are slightly elevated, but with poor renal function will maintain therapy as is for now and closely monitor Continue tube feeds and dietary supplements -hgb stabilized without intervention -platelets stable    Labs:  CBC: Recent Labs  Lab 05/19/24 1610 05/19/24 1615 05/20/24 0456 05/20/24 1028 05/21/24 0543 05/22/24 0500 05/23/24 0445 05/24/24 0414  WBC 4.8  --  5.1  --  6.5 7.3 4.8 4.1  NEUTROABS 4.2  --   --   --   --   --   --   --   HGB 12.2   < > 9.8* 9.2* 8.5* 8.9* 7.8* 8.3*  HCT 38.5   < > 30.5* 27.0* 27.2* 27.5* 25.5* 26.8*  MCV 98.2  --  96.8  --  100.0 97.9 99.6 98.9  PLT 114*  --  119*  --  101* 103* 81* 100*   < > = values in this interval not displayed.   Basic Metabolic Panel: Recent Labs  Lab 05/19/24 1610 05/19/24 1615  05/19/24 2058 05/20/24 0456 05/20/24 1028 05/21/24 0543 05/22/24 0500 05/23/24 0445  NA 138 139   < > 138 138 136 133* 133*  K 3.1* 3.0*   < > 2.7* 3.6 4.5 3.8 4.3  CL 95* 97*  --  95*  --  95* 91* 91*  CO2 24  --   --  31  --  26 27 25   GLUCOSE 147* 154*  --  126*  --  117* 136* 116*  BUN 10 9  --  14  --  21* 12 29*  CREATININE 2.80* 2.90*  --  3.55*  --  4.76* 2.23* 3.67*  CALCIUM  9.3  --   --  9.7  --  9.4 9.0 9.7  MG  --   --   --  2.4  --   --  2.1  --   PHOS  --   --   --  2.4*  --  6.4* 3.2  --    < > = values in this interval not displayed.    GFR: Estimated Creatinine Clearance: 15.6 mL/min (A) (by C-G formula based on SCr of 3.67 mg/dL (H)). Recent Labs  Lab 05/21/24 0543 05/22/24 0500 05/23/24 0445 05/24/24 0414  WBC 6.5 7.3 4.8 4.1   Liver Function Tests: Recent Labs  Lab 05/19/24 1610 05/21/24 0543  AST 23  --   ALT 19  --   ALKPHOS 88  --   BILITOT 0.9  --   PROT 7.3  --   ALBUMIN  3.6 2.7*   No results for input(s): LIPASE, AMYLASE in the last 168 hours. No results for input(s): AMMONIA in the last 168 hours.  ABG:    Component Value Date/Time   PHART 7.470 (H) 05/20/2024 1028   PCO2ART 45.3 05/20/2024 1028   PO2ART 196 (H) 05/20/2024 1028   HCO3 32.9 (H) 05/20/2024 1028   TCO2 34 (H) 05/20/2024 1028   ACIDBASEDEF 5.2 (H) 01/13/2015 0300   O2SAT 100 05/20/2024 1028   Coagulation Profile: Recent Labs  Lab 05/19/24 1610 05/20/24 0456  INR 1.1 1.1   Cardiac Enzymes: No results for input(s): CKTOTAL, CKMB, CKMBINDEX, TROPONINI in the last 168 hours.  HbA1C: Hgb A1c MFr Bld  Date/Time Value Ref Range Status  05/20/2024 04:56 AM 3.6 (L) 4.8 - 5.6 % Final    Comment:    (NOTE) Diagnosis of Diabetes The following HbA1c ranges recommended by the American Diabetes Association (ADA) may be used as an aid in the diagnosis of diabetes mellitus.  Hemoglobin             Suggested A1C NGSP%              Diagnosis  <5.7                   Non Diabetic  5.7-6.4                Pre-Diabetic  >6.4                   Diabetic  <7.0                   Glycemic control for                       adults with diabetes.    02/14/2024 09:49 AM 4.2 (L) 4.8 - 5.6 % Final    Comment:    (NOTE) Diagnosis of Diabetes The following HbA1c ranges  recommended by the American Diabetes Association (ADA) may be used as an aid in the diagnosis of diabetes mellitus.  Hemoglobin             Suggested A1C NGSP%              Diagnosis  <5.7                   Non Diabetic  5.7-6.4                 Pre-Diabetic  >6.4                   Diabetic  <7.0                   Glycemic control for                       adults with diabetes.     CBG: Recent Labs  Lab 05/23/24 1544 05/23/24 1936 05/24/24 0004 05/24/24 0327 05/24/24 0743  GLUCAP 151* 145* 137* 147* 149*   The patient is critically ill due to acute right parietotemporal intraparenchymal hemorrhage with IVH in the setting of hypertensive emergency.  Critical care was necessary to treat or prevent imminent or life-threatening deterioration.  Critical care was time spent personally by me on the following activities: development of treatment plan with patient and/or surrogate as well as nursing, discussions with consultants, evaluation of patient's response to treatment, examination of patient, obtaining history from patient or surrogate, ordering and performing treatments and interventions, ordering and review of laboratory studies, ordering and review of radiographic studies, pulse oximetry, re-evaluation of patient's condition and participation in multidisciplinary rounds.   During this encounter critical care time was devoted to patient care services described in this note for 37 minutes.    Harlene Na DO [facility] 05/24/2024, 10:29 AM See Amion for pager If no response to pager, please call [phone] until 1900 After 1900 please call [facility] [phone]

## 2024-05-24 NOTE — Progress Notes (Signed)
  NEUROSURGERY PROGRESS NOTE   No issues overnight.   EXAM:  BP 125/62 (BP Location: Left Arm)   Pulse 94   Temp 100 F (37.8 C) (Axillary)   Resp (!) 21   Ht 5' 3 (1.6 m)   Wt 49.4 kg   SpO2 100%   BMI 19.29 kg/m   Awake, alert Breathing over vent Occassionally follows commands Moves BUE/BLE symmetrically   IMPRESSION:  43 y.o. female with extensive medical history admitted with large right IVH with improvement after EVD.  PLAN: - Cont EVD drainage for now - Cont supportive care per PCCM/neurology   Gerldine Maizes, MD [facility]

## 2024-05-24 NOTE — Progress Notes (Signed)
  [facility] Progress Note  Subjective:  Seen in ICU Not responsive  HD yest w/ 3.5 L removed  Vitals:   05/24/24 0945 05/24/24 1000 05/24/24 1015 05/24/24 1132  BP:  (!) 108/58    Pulse: 85 83 86   Resp:      Temp:    98.9 F (37.2 C)  TempSrc:    Axillary  SpO2: 100% 100% 100%   Weight:      Height:        Exam: Gen on vent, sedated, EVD in place  Sclera anicteric, throat w/ ETT No jvd or bruits Chest clear anterior/ lateral RRR no MRG Abd soft ntnd no mass or ascites +bs GU defer Ext no LE or UE edema Neuro is on vent, sedated   RUE AVF+bruit    Home bp meds: Norvasc  10 every day Catapres  patch 0.3mg  weekly Hydralazine  75mg  tid Metoprolol  50mg  bid    OP HD: [facility] MWF 3h  B400  55.2kg  2K bath   AVF  Heparin  none Last OP HD 9/15, about 2h 50 min then LOC Mircera 150 mcg q 2 wks, last 9/10, due 9/24       Assessment/ Plan: Acute R IC bleed: d/t uncont HTN. On vent, per neuro/ CCM.  ESRD: on HD MWF. Had HD here Wed and Friday. Next HD Monday.   HTN: per CCM, remains on IV clevidipine  + norvasc / coreg / clonidine / hydralazine  per tube Volume: several kg under dry, pt euvolemic on exam. Follow.  Anemia of esrd: Hb 10-12 here, follow.  VDRF: per CCM H/o pancreas/ renal transplant: renal transplant has failed some time ago, pancreas is still working; getting CyA, cellcept  and prednisone .     Myer Fret MD  [facility] 05/24/2024, 11:43 AM  Recent Labs  Lab 05/19/24 1610 05/19/24 1615 05/21/24 0543 05/22/24 0500 05/23/24 0445 05/24/24 0414  HGB 12.2   < > 8.5* 8.9* 7.8* 8.3*  ALBUMIN  3.6  --  2.7*  --   --   --   CALCIUM  9.3   < > 9.4 9.0 9.7  --   PHOS  --    < > 6.4* 3.2  --   --   CREATININE 2.80*   < > 4.76* 2.23* 3.67*  --   K 3.1*   < > 4.5 3.8 4.3  --    < > = values in this interval not displayed.   No results for input(s): IRON , TIBC, FERRITIN in the last 168 hours. Inpatient medications:  amLODipine   10 mg Per Tube Daily    carvedilol   25 mg Per Tube BID WC   Chlorhexidine  Gluconate Cloth  6 each Topical Q0600   cloNIDine   0.3 mg Per Tube TID   cycloSPORINE   200 mg Per Tube QPM   cycloSPORINE   225 mg Per Tube Daily   feeding supplement (PROSource TF20)  60 mL Per Tube Daily   heparin  injection (subcutaneous)  5,000 Units Subcutaneous Q12H   hydrALAZINE   50 mg Per Tube Q8H   insulin  aspart  0-9 Units Subcutaneous Q4H   mycophenolate   500 mg Per Tube BID   mouth rinse  15 mL Mouth Rinse Q2H   pantoprazole  (PROTONIX ) IV  40 mg Intravenous QHS   polyethylene glycol  17 g Per Tube Daily   predniSONE   15 mg Per Tube Q breakfast   sennosides  5 mL Per Tube BID    anticoagulant sodium citrate      ceFEPime  (MAXIPIME ) IV 200 mL/hr at  05/24/24 1000   clevidipine  19 mg/hr (05/24/24 1000)   dexmedetomidine  (PRECEDEX ) IV infusion Stopped (05/23/24 0916)   feeding supplement (OSMOLITE 1.5 CAL) 50 mL/hr at 05/24/24 1000   lacosamide  (VIMPAT ) IV 100 mg (05/24/24 1014)   acetaminophen  **OR** acetaminophen  (TYLENOL ) oral liquid 160 mg/5 mL **OR** acetaminophen , alteplase , anticoagulant sodium citrate , docusate sodium , feeding supplement (NEPRO CARB STEADY), fentaNYL  (SUBLIMAZE ) injection, heparin , hydrALAZINE , iohexol , labetalol , lidocaine  (PF), lidocaine -prilocaine , mouth rinse, pentafluoroprop-tetrafluoroeth

## 2024-05-24 NOTE — Progress Notes (Addendum)
 PT OT.  STROKE TEAM PROGRESS NOTE    SIGNIFICANT HOSPITAL EVENTS 9/15- Admitted with large ICH, IVH. Intubated, EVD placed 9/19 - remains off sedation   INTERIM HISTORY/SUBJECTIVE Remains intubated, no weaning this morning. Low grade fever this morning. WBC normal.   OBJECTIVE  CBC    Component Value Date/Time   WBC 4.1 05/24/2024 0414   RBC 2.71 (L) 05/24/2024 0414   HGB 8.3 (L) 05/24/2024 0414   HCT 26.8 (L) 05/24/2024 0414   PLT 100 (L) 05/24/2024 0414   MCV 98.9 05/24/2024 0414   MCH 30.6 05/24/2024 0414   MCHC 31.0 05/24/2024 0414   RDW 13.7 05/24/2024 0414   LYMPHSABS 0.4 (L) 05/19/2024 1610   MONOABS 0.2 05/19/2024 1610   EOSABS 0.0 05/19/2024 1610   BASOSABS 0.0 05/19/2024 1610    BMET    Component Value Date/Time   NA 133 (L) 05/23/2024 0445   K 4.3 05/23/2024 0445   CL 91 (L) 05/23/2024 0445   CO2 25 05/23/2024 0445   GLUCOSE 116 (H) 05/23/2024 0445   BUN 29 (H) 05/23/2024 0445   CREATININE 3.67 (H) 05/23/2024 0445   CALCIUM  9.7 05/23/2024 0445   GFRNONAA 15 (L) 05/23/2024 0445    IMAGING past 24 hours No results found.   Vitals:   05/24/24 0645 05/24/24 0700 05/24/24 0800 05/24/24 0812  BP:  (!) 118/59 125/62   Pulse: 92 94 94 94  Resp: 20 (!) 21 (!) 23 (!) 21  Temp:   100 F (37.8 C)   TempSrc:   Axillary   SpO2: 100% 100% 100%   Weight:      Height:         PHYSICAL EXAM General: Awake, a little agitated this morning  CV: Regular rate and rhythm on monitor Respiratory: Mechanically ventilated GI: GJ tube in place   NEURO:  Mental Status: Eyes open with noxious stimuli, not following commands. Appears to intermittently mouth words. Tracks examiner Cranial Nerves:  II: PERRL.  III, IV, VI: Gaze disconjugate, comes to midline  VII: Face is symmetrical resting  VIII:  IX, X: Cough and gag intact XI: Head is to the right  XII: tongue is midline without fasciculations. Motor: Withdraws to noxious stimuli,moves spontaneously but  does not maintain antigravity strength. Left appears weaker than right.  Tone: is normal and bulk is normal Coordination deferred Gait- deferred  Most Recent NIH 21  1a Level of Conscious:1 1b LOC Questions: 2 1c LOC Commands: 2 2 Best Gaze: 1 3 Visual: 0 4 Facial Palsy: 0 5a Motor Arm - left: 2 5b Motor Arm - Right: 2 6a Motor Leg - Left: 3 6b Motor Leg - Right: 2 7 Limb Ataxia: 0 8 Sensory: 1 9 Best Language: 3 10 Dysarthria:UN 11 Extinct. and Inattention:2 TOTAL:21     ASSESSMENT/PLAN  [name] is a 43 y.o. female with history of hypertension, ESRD on HD status post renal and pancreatic transplants, diabetes type 1, gastroparesis, multiple strokes both ischemic and hemorrhagic along with prepontine subarachnoid hemorrhage which was angio negative, and during last admission had hydrocephalus requiring EVD placement, then discharged to a nursing facility-today had a change in mentation at dialysis where she had a leftward gaze and left-sided weakness for which EMS was called.Her blood pressures were in the systolic 200s.    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 Acute  hemorrhage in the medial right temporal lobe, inseparable from the intraventricular hemorrhage. Small foci of residual/recurrent hemorrhage and/or mineralization in the left cerebellar hemisphere. 9/15- repeat CT head - No significant change in large volume of acute intraventricular hemorrhage and moderate hydrocephalus. Similar intraparenchymal hemorrhage in the right hippocampal region that is inseparable from the intraventricular hemorrhage.  MRI   Intraventricular hemorrhage with ventriculomegaly and left frontal ventriculostomy tube in place. No acute ischemic infarct. Multiple old infarcts including both sides of the pons, left parietal lobe and left inferior  cerebellum. MRA motion-degraded, no significant stenosis CT repeat 9/18 showed regressed ventriculomegaly, stable ICH and IVH 2D Echo EF 60-65% in 03/2024 LDL 38 HgbA1c 3.6 VTE prophylaxis - SCDs No antithrombotic prior to admission, now on No antithrombotic Therapy recommendations:  Pending Disposition:  Pending, palliative care on board, full code and full scope  Hx of ICH 02/2024- Perimesencephalic SAH with IVH and hydrocephalus.  Status post EVD.  Cerebral angiogram no aneurysm or AVM but diffuse anterior circulation vasospasm.  MRI [place] small couple callosum and cerebellar infarct.  EF 60 to 65%, LDL 63, A1c 4.2.  EEG sharp waves, concerning for seizure activity, put on Vimpat .  Patient has prolonged hospitalization and enterally discharged to SNF.  Hx of Seizures Concerning for seizure during last admission for [facility] and IVH. Discharged on lacosamide   EEG generalized periodic discharges with triphasic morphology. This EEG pattern can be seen due to toxic-metabolic causes. However, due to patient's complicated history, can also be on the ictal-interictal continuum but with low suspicion for seizure recurrence.  Continue home lacosamide  100 mg twice daily  Obstructive hydrocephalus EVD in place at 10 cm of water  Management per NSGY CT and MRI no significant midline shift CT repeat 9/18 showed regressed ventriculomegaly, stable IVH  Respiratory failure  Obtunded on arrival to ED, intubated by CCM SBT/VAP per CCM PRN Fentanyl   Appreciate CCM assistance  Hypertension Home meds:  Hydralazine , Amlodipine  and clonidine   On Cleviprex   BP goal less than 160 On amlodipine  10 and clonidine  0.3 tid and coreg  25 bid And losartan  9/18 Wean off Cleviprex  as able  Diabetes type I Controlled Hx of pancreas transplant Home meds:  Cellcept , prednisone , and cyclosporine   HgbA1c 3.6, goal < 7.0 CBGs SSI Recommend close follow-up with PCP for better DM control  ESRD on HD Hx of renal  transplant (failed) Home meds: Cellcept , prednisone , and cyclosporine   Creatinine 2.80--3.55--4.76--2.23 HD schedule is MWF Nephrology consulted  Dysphagia Patient has GJ tube, however GJ tube clogged -> replaced by IR 9/17 On TF @ 50  Other stroke risk factors  Other medical issues Hypokalemia, K 3.1--2.8-3.6--4.5--3.8  Hospital day # 5   Patient seen and examined by NP/APP with MD. MD to update note as needed.   Jorene Last, DNP, FNP-BC [name] Pager: [phone]    ATTENDING NOTE: I reviewed above note and agree with the assessment and plan. Pt was seen and examined.   This patient is critically ill due to large IVH, status post EVD and at significant risk of neurological worsening, death form brain hemorrhage, edema or respiratory failure. This patient's care requires constant monitoring of vital signs, hemodynamics, respiratory and cardiac monitoring, review of multiple databases, neurological assessment, discussion with family, other specialists and medical decision making of high complexity. I spent 40 minutes of neurocritical care time in the care of this patient.  For detailed assessment and plan, please refer to above as I have made changes wherever appropriate.  Pastor Falling, MD  Stroke Neurology     To contact Stroke Continuity provider, please refer to [email or web address]. After hours, contact General Neurology

## 2024-05-25 DIAGNOSIS — J1569 Pneumonia due to other gram-negative bacteria: Secondary | ICD-10-CM | POA: Diagnosis not present

## 2024-05-25 DIAGNOSIS — I619 Nontraumatic intracerebral hemorrhage, unspecified: Secondary | ICD-10-CM | POA: Diagnosis not present

## 2024-05-25 DIAGNOSIS — N186 End stage renal disease: Secondary | ICD-10-CM | POA: Diagnosis not present

## 2024-05-25 DIAGNOSIS — Z992 Dependence on renal dialysis: Secondary | ICD-10-CM | POA: Diagnosis not present

## 2024-05-25 LAB — CBC
HCT: 26.7 % — ABNORMAL LOW (ref 36.0–46.0)
Hemoglobin: 8.3 g/dL — ABNORMAL LOW (ref 12.0–15.0)
MCH: 30.3 pg (ref 26.0–34.0)
MCHC: 31.1 g/dL (ref 30.0–36.0)
MCV: 97.4 fL (ref 80.0–100.0)
Platelets: 113 K/uL — ABNORMAL LOW (ref 150–400)
RBC: 2.74 MIL/uL — ABNORMAL LOW (ref 3.87–5.11)
RDW: 13.6 % (ref 11.5–15.5)
WBC: 3.4 K/uL — ABNORMAL LOW (ref 4.0–10.5)
nRBC: 0 % (ref 0.0–0.2)

## 2024-05-25 LAB — GLUCOSE, CAPILLARY
Glucose-Capillary: 120 mg/dL — ABNORMAL HIGH (ref 70–99)
Glucose-Capillary: 131 mg/dL — ABNORMAL HIGH (ref 70–99)
Glucose-Capillary: 134 mg/dL — ABNORMAL HIGH (ref 70–99)
Glucose-Capillary: 136 mg/dL — ABNORMAL HIGH (ref 70–99)
Glucose-Capillary: 142 mg/dL — ABNORMAL HIGH (ref 70–99)
Glucose-Capillary: 148 mg/dL — ABNORMAL HIGH (ref 70–99)
Glucose-Capillary: 153 mg/dL — ABNORMAL HIGH (ref 70–99)

## 2024-05-25 LAB — CULTURE, RESPIRATORY W GRAM STAIN: Gram Stain: NONE SEEN

## 2024-05-25 LAB — BASIC METABOLIC PANEL WITH GFR
Anion gap: 16 — ABNORMAL HIGH (ref 5–15)
BUN: 60 mg/dL — ABNORMAL HIGH (ref 6–20)
CO2: 23 mmol/L (ref 22–32)
Calcium: 10.1 mg/dL (ref 8.9–10.3)
Chloride: 90 mmol/L — ABNORMAL LOW (ref 98–111)
Creatinine, Ser: 3.89 mg/dL — ABNORMAL HIGH (ref 0.44–1.00)
GFR, Estimated: 14 mL/min — ABNORMAL LOW (ref >60)
Glucose, Bld: 126 mg/dL — ABNORMAL HIGH (ref 70–99)
Potassium: 5.9 mmol/L — ABNORMAL HIGH (ref 3.5–5.1)
Sodium: 129 mmol/L — ABNORMAL LOW (ref 135–145)

## 2024-05-25 LAB — MAGNESIUM: Magnesium: 2.7 mg/dL — ABNORMAL HIGH (ref 1.7–2.4)

## 2024-05-25 LAB — PHOSPHORUS: Phosphorus: 6 mg/dL — ABNORMAL HIGH (ref 2.5–4.6)

## 2024-05-25 MED ORDER — CARVEDILOL 12.5 MG PO TABS
50.0000 mg | ORAL_TABLET | Freq: Two times a day (BID) | ORAL | Status: DC
  Administered 2024-05-25 – 2024-05-29 (×7): 50 mg
  Filled 2024-05-25 (×7): qty 4

## 2024-05-25 MED ORDER — LABETALOL HCL 5 MG/ML IV SOLN
20.0000 mg | INTRAVENOUS | Status: DC | PRN
  Administered 2024-05-25 – 2024-07-12 (×23): 20 mg via INTRAVENOUS
  Filled 2024-05-25 (×22): qty 4

## 2024-05-25 MED ORDER — NEPRO/CARBSTEADY PO LIQD
1000.0000 mL | ORAL | Status: DC
  Administered 2024-05-25 – 2024-05-26 (×2): 1000 mL
  Filled 2024-05-25 (×3): qty 1000

## 2024-05-25 MED ORDER — CHLORHEXIDINE GLUCONATE CLOTH 2 % EX PADS
6.0000 | MEDICATED_PAD | Freq: Every day | CUTANEOUS | Status: DC
  Administered 2024-05-26 – 2024-06-03 (×10): 6 via TOPICAL

## 2024-05-25 MED ORDER — CARVEDILOL 12.5 MG PO TABS
25.0000 mg | ORAL_TABLET | Freq: Once | ORAL | Status: AC
  Administered 2024-05-25: 25 mg
  Filled 2024-05-25: qty 2

## 2024-05-25 MED ORDER — SODIUM ZIRCONIUM CYCLOSILICATE 10 G PO PACK
10.0000 g | PACK | Freq: Three times a day (TID) | ORAL | Status: DC
  Administered 2024-05-25 – 2024-05-26 (×6): 10 g
  Filled 2024-05-25 (×6): qty 1

## 2024-05-25 MED ORDER — CARVEDILOL 12.5 MG PO TABS: 50.0000 mg | ORAL_TABLET | Freq: Two times a day (BID) | ORAL | Status: DC

## 2024-05-25 NOTE — Progress Notes (Signed)
  NEUROSURGERY PROGRESS NOTE   No issues overnight.   EXAM:  BP 131/61 (BP Location: Left Arm)   Pulse 93   Temp 100.2 F (37.9 C) (Axillary)   Resp (!) 31   Ht 5' 3 (1.6 m)   Wt 49.2 kg   SpO2 100%   BMI 19.21 kg/m   Drowsy, opens eyes to voice Breathing over vent Occassionally follows commands Moves BUE/BLE symmetrically   IMPRESSION:  - 43 y.o. female with extensive medical history admitted with large right IVH with improvement after EVD. - ESRD, MWF HD. Suspect her slightly decreased mentation this am likely due to some electrolyte imbalance.  PLAN: - Cont EVD drainage for now, may attempt to start weaning this week. - Cont supportive care per PCCM/neurology - HD tomorrow   Gerldine Maizes, MD [facility]

## 2024-05-25 NOTE — Progress Notes (Signed)
 NAME:  [name] [name], MRN:  [id number], DOB:  1981-06-26, LOS: 6 ADMISSION DATE:  05/19/2024 CONSULTATION DATE:  9/15/20025 REFERRING MD: Patsey - EDP, CHIEF COMPLAINT:  Code Stroke, ICH   History of Present Illness:  43 year old woman who presented to [facility] ED 9/15 as a Code Stroke. 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 Recent admissions 6/11-7/28 and 7/29-8/13 for ICH.  Patient presented to [facility] ED from dialysis as a Code Stroke; reportedly LKW 1450 with development of obtundation and hemiparesis. SBP 200s. NIHSS 31 with initial leftward gaze, decreased LOC/disorientation, bilateral facial droop/arm weakness/leg weakness, global aphasia, dysarthria, visual neglect. CT Head demonstrated large volume IVH with moderate hydrocephalus, acute hemorrhage of medial R temporal lobe and small foci of residual/recurrent hemorrhage in the L cerebellar hemisphere.   Patient was seen by stroke team and neurosurgery, EVD was placed on left side, PCCM was consulted for medical evaluation and admission to neuro ICU  Pertinent Medical History:   Past Medical History:  Diagnosis Date   Anemia    Anxiety    DM (diabetes mellitus), type 1 (HCC)    ESRD on hemodialysis (HCC)    Gastroparesis    History of simultaneous kidney and pancreas transplant (HCC) 2014   Hypertension    Ischemic cerebrovascular accident (CVA) (HCC) 11/2021   Narcotic abuse (HCC)    Non compliance with medical treatment    Stroke [facility])    Vision loss    Significant Hospital Events: Including procedures, antibiotic start and stop dates in addition to other pertinent events   9/15 admitted with ICH plus IVH, EVD was placed for hydrocephalus 9/16 remain on clevidipine  infusion, tolerating spontaneous breathing trial, EVD is draining bloody fluid 9/17 continue to  require clevidipine  infusion, tolerated spontaneous breathing trial 9/18: No overnight issues. Currently off sedation. Still on high-dose `clevidipine  infusion 9/19: this am, dialysis ongoing. Neurosx plans to continue ventriculostomy for now. Remains on cleviprex  to maintain BP goal. Tmax 100.1. despite plt decrease 4T score 0. Will maintain heparin  as is for now and closely follow trend.   Interim History / Subjective:  No events, somnolent on vent  Objective:  Blood pressure 125/62, pulse 90, temperature 99.9 F (37.7 C), temperature source Axillary, resp. rate (!) 32, height 5' 3 (1.6 m), weight 49.2 kg, SpO2 100%.    Vent Mode: PSV;CPAP FiO2 (%):  [40 %] 40 % Set Rate:  [12 bmp] 12 bmp Vt Set:  [490 mL] 490 mL PEEP:  [5 cmH20] 5 cmH20 Pressure Support:  [12 cmH20] 12 cmH20 Plateau Pressure:  [20 cmH20-26 cmH20] 20 cmH20   Intake/Output Summary (Last 24 hours) at 05/25/2024 1222 Last data filed at 05/25/2024 1100 Gross per 24 hour  Intake 2144.52 ml  Output 223 ml  Net 1921.52 ml   Filed Weights   05/23/24 0851 05/24/24 0424 05/25/24 0500  Weight: 45.2 kg 49.4 kg 49.2 kg   Physical Examination: Chronically ill appearing Can move R some, not much at all on L Has follows some folks commands but I cannot get her to do so Ext warm Abd soft  RASS 0 Labs reviewed   Assessment & Plan:  Acute right parietotemporal intraparenchymal hemorrhage with IVH with ICH score 2 due to uncontrolled hypertension Hypertensive emergency Obstructive hydrocephalus status post EVD Acute left parietal ischemic stroke was ruled out Acute encephalopathy in the setting of ICH and hydrocephalus  Acute respiratory insufficiency Serratia/klebsiella pneumonia End-stage renal disease on hemodialysis with failed renal transplant but functional pancreatic transplant on immunosuppressive therapy Hypervolemic hyponatremia, hypochloremia  Type 1 diabetes, complicated with vasculopathy and  gastroparesis Narcotic abuse Moderate protein calorie malnutrition Normocytic anemia thrombocytopenia  - EVD per neurosurgery - AEDs per neuro - Usual neuroprotective measures - iHD, lokelma  per neurology - cleviprex  for SBP < 160; already on clonidine , amlodipine , hydralazine , coreg ; will try to increase coreg  ceiling to wean off cleviprex ; consider ARB depending on am K - continue steroids, cyclosporine , and cellcept  for pancreatic tx - SSI I guess for now; has not been on long acting since 2013 - cefepime  through 05/30/24 - mother updated, does not want trach unless absolutely necessary - I think a multidisciplinary discussion with PMT, CCM, and mother would be helpful to discuss what life would look like if we went down the trach/PEG/iHD out-of-state placement  33 min cc time Rolan Sharps MD PCCM

## 2024-05-25 NOTE — Progress Notes (Addendum)
 PT OT.  STROKE TEAM PROGRESS NOTE    SIGNIFICANT HOSPITAL EVENTS 9/15- Admitted with large ICH, IVH. Intubated, EVD placed 9/19 - remains off sedation   INTERIM HISTORY/SUBJECTIVE Remains intubated, weaning this morning. Low grade fever this morning. WBC normal. Still requiring cleviprex - PRNs increased.  K elevate- nephrology changed TF and planning for HD tomorrow   OBJECTIVE  CBC    Component Value Date/Time   WBC 3.4 (L) 05/25/2024 0341   RBC 2.74 (L) 05/25/2024 0341   HGB 8.3 (L) 05/25/2024 0341   HCT 26.7 (L) 05/25/2024 0341   PLT 113 (L) 05/25/2024 0341   MCV 97.4 05/25/2024 0341   MCH 30.3 05/25/2024 0341   MCHC 31.1 05/25/2024 0341   RDW 13.6 05/25/2024 0341   LYMPHSABS 0.4 (L) 05/19/2024 1610   MONOABS 0.2 05/19/2024 1610   EOSABS 0.0 05/19/2024 1610   BASOSABS 0.0 05/19/2024 1610    BMET    Component Value Date/Time   NA 129 (L) 05/25/2024 0341   K 5.9 (H) 05/25/2024 0341   CL 90 (L) 05/25/2024 0341   CO2 23 05/25/2024 0341   GLUCOSE 126 (H) 05/25/2024 0341   BUN 60 (H) 05/25/2024 0341   CREATININE 3.89 (H) 05/25/2024 0341   CALCIUM  10.1 05/25/2024 0341   GFRNONAA 14 (L) 05/25/2024 0341    IMAGING past 24 hours No results found.   Vitals:   05/25/24 0751 05/25/24 0800 05/25/24 0807 05/25/24 0815  BP: (!) 125/58 131/61    Pulse:  94  93  Resp:  (!) 30  (!) 28  Temp:   100.2 F (37.9 C)   TempSrc:   Axillary   SpO2:  100%  100%  Weight:      Height:         PHYSICAL EXAM General: Awake, a little agitated this morning  CV: Regular rate and rhythm on monitor Respiratory: Mechanically ventilated GI: GJ tube in place   NEURO:  Mental Status: Eyes open with noxious stimuli, not following commands. Appears to intermittently mouth words. Tracks examiner Cranial Nerves:  II: PERRL.  III, IV, VI: Gaze disconjugate, comes to midline  VII: Face is symmetrical resting  VIII:  IX, X: Cough and gag intact XI: Head is to the right  XII: tongue  is midline without fasciculations. Motor: Withdraws to noxious stimuli,moves spontaneously but does not maintain antigravity strength. Left appears weaker than right.  Tone: is normal and bulk is normal Coordination deferred Gait- deferred  Most Recent NIH 21  1a Level of Conscious:1 1b LOC Questions: 2 1c LOC Commands: 2 2 Best Gaze: 1 3 Visual: 0 4 Facial Palsy: 0 5a Motor Arm - left: 2 5b Motor Arm - Right: 2 6a Motor Leg - Left: 3 6b Motor Leg - Right: 2 7 Limb Ataxia: 0 8 Sensory: 1 9 Best Language: 3 10 Dysarthria:UN 11 Extinct. and Inattention:2 TOTAL:21     ASSESSMENT/PLAN  [name] is a 43 y.o. female with history of hypertension, ESRD on HD status post renal and pancreatic transplants, diabetes type 1, gastroparesis, multiple strokes both ischemic and hemorrhagic along with prepontine subarachnoid hemorrhage which was angio negative, and during last admission had hydrocephalus requiring EVD placement, then discharged to a nursing facility-today had a change in mentation at dialysis where she had a leftward gaze and left-sided weakness for which EMS was called.Her blood pressures were in the systolic 200s.    ICH:  right temporal lobe ICH with IVH extension, etiology:  likely  hypertension  Code Stroke CT head - Large volume acute intraventricular hemorrhage with moderate hydrocephalus. Acute hemorrhage in the medial right temporal lobe, inseparable from the intraventricular hemorrhage. Small foci of residual/recurrent hemorrhage and/or mineralization in the left cerebellar hemisphere. 9/15- repeat CT head - No significant change in large volume of acute intraventricular hemorrhage and moderate hydrocephalus. Similar intraparenchymal hemorrhage in the right hippocampal region that is inseparable from the intraventricular hemorrhage.  MRI   Intraventricular hemorrhage with ventriculomegaly and left frontal ventriculostomy tube in place. No acute ischemic infarct.  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 MRA motion-degraded, no significant stenosis CT repeat 9/18 showed regressed ventriculomegaly, stable ICH and IVH 2D Echo EF 60-65% in 03/2024 LDL 38 HgbA1c 3.6 VTE prophylaxis - SCDs No antithrombotic prior to admission, now on No antithrombotic Therapy recommendations:  Pending Disposition:  Pending, palliative care on board, full code and full scope  Hx of ICH 02/2024- Perimesencephalic SAH with IVH and hydrocephalus.  Status post EVD.  Cerebral angiogram no aneurysm or AVM but diffuse anterior circulation vasospasm.  MRI Redell small couple callosum and cerebellar infarct.  EF 60 to 65%, LDL 63, A1c 4.2.  EEG sharp waves, concerning for seizure activity, put on Vimpat .  Patient has prolonged hospitalization and enterally discharged to SNF.  Hx of Seizures Concerning for seizure during last admission for [facility] and IVH. Discharged on lacosamide   EEG generalized periodic discharges with triphasic morphology. This EEG pattern can be seen due to toxic-metabolic causes. However, due to patient's complicated history, can also be on the ictal-interictal continuum but with low suspicion for seizure recurrence.  Continue home lacosamide  100 mg twice daily  Obstructive hydrocephalus EVD in place at 10 cm of water  Management per NSGY CT and MRI no significant midline shift CT repeat 9/18 showed regressed ventriculomegaly, stable IVH  Respiratory failure  Obtunded on arrival to ED, intubated by CCM SBT/VAP per CCM - pressure support this morning  PRN Fentanyl   Appreciate CCM assistance  Hypertension Home meds:  Hydralazine , Amlodipine  and clonidine   On Cleviprex   BP goal less than 160 On amlodipine  10 and clonidine  0.3 tid and coreg  25 bid And hydralazine  100mg  TID  Wean off Cleviprex  as able  Diabetes type I Controlled Hx of pancreas transplant Home meds:  Cellcept , prednisone , and  cyclosporine   HgbA1c 3.6, goal < 7.0 CBGs SSI Recommend close follow-up with PCP for better DM control  ESRD on HD Hx of renal transplant (failed) Home meds: Cellcept , prednisone , and cyclosporine   Creatinine 2.80--3.55--4.76--2.23 HD schedule is MWF Nephrology consulted  Dysphagia Patient has GJ tube, however GJ tube clogged -> replaced by IR 9/17 On TF @ 50  Other stroke risk factors  Other medical issues Hypokalemia, K 3.1--2.8-3.6--4.5--3.8  Hospital day # 6   Patient seen and examined by NP/APP with MD. MD to update note as needed.   Jorene Last, DNP, FNP-BC [name] Pager: [phone]    ATTENDING NOTE: I reviewed above note and agree with the assessment and plan. Pt was seen and examined.   This patient is critically ill due to large IVH, status post EVD and at significant risk of neurological worsening, death form brain hemorrhage, edema or respiratory failure. This patient's care requires constant monitoring of vital signs, hemodynamics, respiratory and cardiac monitoring, review of multiple databases, neurological assessment, discussion with family, other specialists and medical decision making of high complexity. I spent 40 minutes of neurocritical care time in the  care of this patient.  For detailed assessment and plan, please refer to above as I have made changes wherever appropriate.     Pastor Falling, MD  Stroke Neurology     To contact Stroke Continuity provider, please refer to [email or web address]. After hours, contact General Neurology

## 2024-05-25 NOTE — Plan of Care (Signed)
 Problem: Education: Goal: Ability to describe self-care measures that may prevent or decrease complications (Diabetes Survival Skills Education) will improve Outcome: Progressing Goal: Individualized Educational Video(s) Outcome: Progressing   Problem: Coping: Goal: Ability to adjust to condition or change in health will improve Outcome: Progressing   Problem: Fluid Volume: Goal: Ability to maintain a balanced intake and output will improve Outcome: Progressing   Problem: Health Behavior/Discharge Planning: Goal: Ability to identify and utilize available resources and services will improve Outcome: Progressing Goal: Ability to manage health-related needs will improve Outcome: Progressing   Problem: Metabolic: Goal: Ability to maintain appropriate glucose levels will improve Outcome: Progressing   Problem: Nutritional: Goal: Maintenance of adequate nutrition will improve Outcome: Progressing Goal: Progress toward achieving an optimal weight will improve Outcome: Progressing   Problem: Skin Integrity: Goal: Risk for impaired skin integrity will decrease Outcome: Progressing   Problem: Tissue Perfusion: Goal: Adequacy of tissue perfusion will improve Outcome: Progressing   Problem: Education: Goal: Knowledge of disease or condition will improve 05/25/2024 0316 by Anda Sobotta, Annitta CROME, RN Outcome: Progressing 05/24/2024 1956 by Allana Annitta CROME, RN Outcome: Progressing Goal: Knowledge of secondary prevention will improve (MUST DOCUMENT ALL) Outcome: Progressing Goal: Knowledge of patient specific risk factors will improve (DELETE if not current risk factor) Outcome: Progressing   Problem: Intracerebral Hemorrhage Tissue Perfusion: Goal: Complications of Intracerebral Hemorrhage will be minimized 05/25/2024 0316 by Chalisa Kobler, Annitta CROME, RN Outcome: Progressing 05/24/2024 1956 by Allana Annitta CROME, RN Outcome: Progressing   Problem: Coping: Goal: Will verbalize  positive feelings about self Outcome: Progressing Goal: Will identify appropriate support needs 05/25/2024 0316 by Allana Annitta CROME, RN Outcome: Progressing 05/24/2024 1956 by Allana Annitta CROME, RN Outcome: Progressing   Problem: Health Behavior/Discharge Planning: Goal: Ability to manage health-related needs will improve Outcome: Progressing Goal: Goals will be collaboratively established with patient/family 05/25/2024 0316 by Allana Annitta CROME, RN Outcome: Progressing 05/24/2024 1956 by Allana Annitta CROME, RN Outcome: Progressing   Problem: Self-Care: Goal: Ability to participate in self-care as condition permits will improve Outcome: Progressing Goal: Verbalization of feelings and concerns over difficulty with self-care will improve Outcome: Progressing Goal: Ability to communicate needs accurately will improve Outcome: Progressing   Problem: Nutrition: Goal: Risk of aspiration will decrease Outcome: Progressing Goal: Dietary intake will improve 05/25/2024 0316 by Jaking Thayer, Annitta CROME, RN Outcome: Progressing 05/24/2024 1956 by Allana Annitta CROME, RN Outcome: Progressing   Problem: Safety: Goal: Non-violent Restraint(s) Outcome: Progressing   Problem: Education: Goal: Knowledge of General Education information will improve Description: Including pain rating scale, medication(s)/side effects and non-pharmacologic comfort measures Outcome: Progressing   Problem: Health Behavior/Discharge Planning: Goal: Ability to manage health-related needs will improve Outcome: Progressing   Problem: Clinical Measurements: Goal: Ability to maintain clinical measurements within normal limits will improve Outcome: Progressing Goal: Will remain free from infection Outcome: Progressing Goal: Diagnostic test results will improve Outcome: Progressing Goal: Respiratory complications will improve Outcome: Progressing Goal: Cardiovascular complication will be avoided Outcome:  Progressing   Problem: Activity: Goal: Risk for activity intolerance will decrease Outcome: Progressing   Problem: Nutrition: Goal: Adequate nutrition will be maintained Outcome: Progressing   Problem: Coping: Goal: Level of anxiety will decrease Outcome: Progressing   Problem: Elimination: Goal: Will not experience complications related to bowel motility Outcome: Progressing Goal: Will not experience complications related to urinary retention Outcome: Progressing   Problem: Pain Managment: Goal: General experience of comfort will improve and/or be controlled Outcome: Progressing   Problem: Safety: Goal: Ability to remain free from  injury will improve Outcome: Progressing   Problem: Skin Integrity: Goal: Risk for impaired skin integrity will decrease Outcome: Progressing

## 2024-05-26 ENCOUNTER — Inpatient Hospital Stay (HOSPITAL_COMMUNITY)

## 2024-05-26 DIAGNOSIS — I120 Hypertensive chronic kidney disease with stage 5 chronic kidney disease or end stage renal disease: Secondary | ICD-10-CM

## 2024-05-26 DIAGNOSIS — I610 Nontraumatic intracerebral hemorrhage in hemisphere, subcortical: Secondary | ICD-10-CM | POA: Diagnosis not present

## 2024-05-26 DIAGNOSIS — D649 Anemia, unspecified: Secondary | ICD-10-CM

## 2024-05-26 DIAGNOSIS — I611 Nontraumatic intracerebral hemorrhage in hemisphere, cortical: Secondary | ICD-10-CM | POA: Diagnosis not present

## 2024-05-26 DIAGNOSIS — I619 Nontraumatic intracerebral hemorrhage, unspecified: Secondary | ICD-10-CM | POA: Diagnosis not present

## 2024-05-26 DIAGNOSIS — I615 Nontraumatic intracerebral hemorrhage, intraventricular: Secondary | ICD-10-CM | POA: Diagnosis not present

## 2024-05-26 DIAGNOSIS — R1310 Dysphagia, unspecified: Secondary | ICD-10-CM

## 2024-05-26 DIAGNOSIS — T8612 Kidney transplant failure: Secondary | ICD-10-CM

## 2024-05-26 DIAGNOSIS — J1569 Pneumonia due to other gram-negative bacteria: Secondary | ICD-10-CM | POA: Diagnosis not present

## 2024-05-26 DIAGNOSIS — Z515 Encounter for palliative care: Secondary | ICD-10-CM | POA: Diagnosis not present

## 2024-05-26 DIAGNOSIS — E1022 Type 1 diabetes mellitus with diabetic chronic kidney disease: Secondary | ICD-10-CM

## 2024-05-26 DIAGNOSIS — G911 Obstructive hydrocephalus: Secondary | ICD-10-CM

## 2024-05-26 DIAGNOSIS — Z992 Dependence on renal dialysis: Secondary | ICD-10-CM | POA: Diagnosis not present

## 2024-05-26 DIAGNOSIS — R29721 NIHSS score 21: Secondary | ICD-10-CM | POA: Diagnosis not present

## 2024-05-26 DIAGNOSIS — N186 End stage renal disease: Secondary | ICD-10-CM

## 2024-05-26 LAB — CBC WITH DIFFERENTIAL/PLATELET
Abs Immature Granulocytes: 0.15 K/uL — ABNORMAL HIGH (ref 0.00–0.07)
Basophils Absolute: 0 K/uL (ref 0.0–0.1)
Basophils Relative: 1 %
Eosinophils Absolute: 0.1 K/uL (ref 0.0–0.5)
Eosinophils Relative: 1 %
HCT: 27.7 % — ABNORMAL LOW (ref 36.0–46.0)
Hemoglobin: 8.7 g/dL — ABNORMAL LOW (ref 12.0–15.0)
Immature Granulocytes: 4 %
Lymphocytes Relative: 18 %
Lymphs Abs: 0.7 K/uL (ref 0.7–4.0)
MCH: 30.5 pg (ref 26.0–34.0)
MCHC: 31.4 g/dL (ref 30.0–36.0)
MCV: 97.2 fL (ref 80.0–100.0)
Monocytes Absolute: 0.5 K/uL (ref 0.1–1.0)
Monocytes Relative: 13 %
Neutro Abs: 2.5 K/uL (ref 1.7–7.7)
Neutrophils Relative %: 63 %
Platelets: 147 K/uL — ABNORMAL LOW (ref 150–400)
RBC: 2.85 MIL/uL — ABNORMAL LOW (ref 3.87–5.11)
RDW: 13.8 % (ref 11.5–15.5)
WBC: 3.9 K/uL — ABNORMAL LOW (ref 4.0–10.5)
nRBC: 0 % (ref 0.0–0.2)

## 2024-05-26 LAB — GLUCOSE, CAPILLARY
Glucose-Capillary: 114 mg/dL — ABNORMAL HIGH (ref 70–99)
Glucose-Capillary: 137 mg/dL — ABNORMAL HIGH (ref 70–99)
Glucose-Capillary: 150 mg/dL — ABNORMAL HIGH (ref 70–99)
Glucose-Capillary: 214 mg/dL — ABNORMAL HIGH (ref 70–99)
Glucose-Capillary: 130 mg/dL — ABNORMAL HIGH (ref 70–99)
Glucose-Capillary: 133 mg/dL — ABNORMAL HIGH (ref 70–99)

## 2024-05-26 LAB — RENAL FUNCTION PANEL
Albumin: 2.3 g/dL — ABNORMAL LOW (ref 3.5–5.0)
Anion gap: 17 — ABNORMAL HIGH (ref 5–15)
BUN: 94 mg/dL — ABNORMAL HIGH (ref 6–20)
CO2: 22 mmol/L (ref 22–32)
Calcium: 10.7 mg/dL — ABNORMAL HIGH (ref 8.9–10.3)
Chloride: 88 mmol/L — ABNORMAL LOW (ref 98–111)
Creatinine, Ser: 5.19 mg/dL — ABNORMAL HIGH (ref 0.44–1.00)
GFR, Estimated: 10 mL/min — ABNORMAL LOW (ref >60)
Glucose, Bld: 130 mg/dL — ABNORMAL HIGH (ref 70–99)
Phosphorus: 6.8 mg/dL — ABNORMAL HIGH (ref 2.5–4.6)
Potassium: 5.4 mmol/L — ABNORMAL HIGH (ref 3.5–5.1)
Sodium: 127 mmol/L — ABNORMAL LOW (ref 135–145)

## 2024-05-26 MED ORDER — ALTEPLASE 2 MG IJ SOLR: 2.0000 mg | Freq: Once | INTRAMUSCULAR | Status: DC | PRN

## 2024-05-26 MED ORDER — HEPARIN SODIUM (PORCINE) 1000 UNIT/ML DIALYSIS: 1000.0000 [IU] | INTRAMUSCULAR | Status: DC | PRN

## 2024-05-26 MED ORDER — NEPRO/CARBSTEADY PO LIQD: 237.0000 mL | ORAL | Status: DC | PRN

## 2024-05-26 MED ORDER — LIDOCAINE HCL (PF) 1 % IJ SOLN: 5.0000 mL | INTRAMUSCULAR | Status: DC | PRN

## 2024-05-26 MED ORDER — PENTAFLUOROPROP-TETRAFLUOROETH EX AERO: 1.0000 | INHALATION_SPRAY | CUTANEOUS | Status: DC | PRN

## 2024-05-26 MED ORDER — LIDOCAINE-PRILOCAINE 2.5-2.5 % EX CREA: 1.0000 | TOPICAL_CREAM | CUTANEOUS | Status: DC | PRN

## 2024-05-26 MED ORDER — ANTICOAGULANT SODIUM CITRATE 4% (200MG/5ML) IV SOLN: 5.0000 mL | Status: DC | PRN

## 2024-05-26 MED ORDER — MINOXIDIL 2.5 MG PO TABS
5.0000 mg | ORAL_TABLET | Freq: Two times a day (BID) | ORAL | Status: DC
  Administered 2024-05-26: 5 mg
  Filled 2024-05-26 (×3): qty 2

## 2024-05-26 NOTE — Progress Notes (Signed)
 SLP Cancellation Note  Patient Details Name: [name] [name] MRN: [id number] DOB: 07-29-1981   Cancelled treatment:        Intubated. Will follow   Dustin Olam Bull 05/26/2024, 8:37 AM

## 2024-05-26 NOTE — Progress Notes (Signed)
 PT OT.  STROKE TEAM PROGRESS NOTE    SIGNIFICANT HOSPITAL EVENTS 9/15- Admitted with large ICH, IVH. Intubated, EVD placed 9/19 - remains off sedation   INTERIM HISTORY/SUBJECTIVE Remains intubated, weaning this morning.  Afebrile this morning. WBC normal.  .  Serum sodium is low. Nephrology is following and is on dialysis.  Blood pressure adequately controlled Ventriculostomy is draining well.  Neurosurgery following OBJECTIVE  CBC    Component Value Date/Time   WBC 3.9 (L) 05/26/2024 0733   RBC 2.85 (L) 05/26/2024 0733   HGB 8.7 (L) 05/26/2024 0733   HCT 27.7 (L) 05/26/2024 0733   PLT 147 (L) 05/26/2024 0733   MCV 97.2 05/26/2024 0733   MCH 30.5 05/26/2024 0733   MCHC 31.4 05/26/2024 0733   RDW 13.8 05/26/2024 0733   LYMPHSABS 0.7 05/26/2024 0733   MONOABS 0.5 05/26/2024 0733   EOSABS 0.1 05/26/2024 0733   BASOSABS 0.0 05/26/2024 0733    BMET    Component Value Date/Time   NA 127 (L) 05/26/2024 0733   K 5.4 (H) 05/26/2024 0733   CL 88 (L) 05/26/2024 0733   CO2 22 05/26/2024 0733   GLUCOSE 130 (H) 05/26/2024 0733   BUN 94 (H) 05/26/2024 0733   CREATININE 5.19 (H) 05/26/2024 0733   CALCIUM  10.7 (H) 05/26/2024 0733   GFRNONAA 10 (L) 05/26/2024 0733    IMAGING past 24 hours DG Chest Port 1 View Result Date: 05/26/2024 EXAM: 1 VIEW XRAY OF THE CHEST 05/26/2024 08:44:00 AM COMPARISON: 05/20/2024 CLINICAL HISTORY: SOB (shortness of breath) [id number]. Reason for exam: SOB FINDINGS: LINES, TUBES AND DEVICES: Endotracheal tube in place with tip 2.6 cm above carina. Stable left subclavian line with tip in the projection of the superior cavoatrial junction. Enteric tube removed. LUNGS AND PLEURA: No focal pulmonary opacity. No pulmonary edema. No pleural effusion. No pneumothorax. HEART AND MEDIASTINUM: Cardiomegaly. No acute abnormality of the mediastinal silhouette. BONES AND SOFT TISSUES: No acute osseous abnormality. IMPRESSION: 1. No acute cardiopulmonary process. 2.  Endotracheal tube with tip 2.6 cm above the carina. 3. Stable left subclavian central venous catheter with tip at the superior cavoatrial junction. 4. Enteric tube removed. Electronically signed by: Waddell Calk MD 05/26/2024 09:02 AM EDT RP Workstation: [id number]     Vitals:   05/26/24 1300 05/26/24 1400 05/26/24 1500 05/26/24 1528  BP: (!) 163/71 (!) 168/73 (!) 172/74   Pulse: 89 92 94 93  Resp: 18 19 (!) 22 18  Temp:      TempSrc:      SpO2: 100% 100% 100% 100%  Weight:      Height:         PHYSICAL EXAM General: Drowsy but can be aroused but not following commands CV: Regular rate and rhythm on monitor Respiratory: Mechanically ventilated GI: GJ tube in place   NEURO:  Mental Status: Eyes open with noxious stimuli, not following commands. Appears to intermittently mouth words. Tracks examiner Cranial Nerves:  II: PERRL.  III, IV, VI: Gaze disconjugate, comes to midline  VII: Face is symmetrical resting  VIII:  IX, X: Cough and gag intact XI: Head is to the right  XII: tongue is midline without fasciculations. Motor: Withdraws to noxious stimuli,moves spontaneously but does not maintain antigravity strength. Left appears weaker than right.  Tone: is normal and bulk is normal Coordination deferred Gait- deferred  Most Recent NIH 21  1a Level of Conscious:1 1b LOC Questions: 2 1c LOC Commands: 2 2 Best Gaze: 1 3 Visual: 0 4  Facial Palsy: 0 5a Motor Arm - left: 2 5b Motor Arm - Right: 2 6a Motor Leg - Left: 3 6b Motor Leg - Right: 2 7 Limb Ataxia: 0 8 Sensory: 1 9 Best Language: 3 10 Dysarthria:UN 11 Extinct. and Inattention:2 TOTAL:21     ASSESSMENT/PLAN  [name] [name] is a 43 y.o. female with history of hypertension, ESRD on HD status post renal and pancreatic transplants, diabetes type 1, gastroparesis, multiple strokes both ischemic and hemorrhagic along with prepontine subarachnoid hemorrhage which was angio negative, and during last  admission had hydrocephalus requiring EVD placement, then discharged to a nursing facility-today had a change in mentation at dialysis where she had a leftward gaze and left-sided weakness for which EMS was called.Her blood pressures were in the systolic 200s.    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 Acute hemorrhage in the medial right temporal lobe, inseparable from the intraventricular hemorrhage. Small foci of residual/recurrent hemorrhage and/or mineralization in the left cerebellar hemisphere. 9/15- repeat CT head - No significant change in large volume of acute intraventricular hemorrhage and moderate hydrocephalus. Similar intraparenchymal hemorrhage in the right hippocampal region that is inseparable from the intraventricular hemorrhage.  MRI   Intraventricular hemorrhage with ventriculomegaly and left frontal ventriculostomy tube in place. No acute ischemic infarct. 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 MRA motion-degraded, no significant stenosis CT repeat 9/18 showed regressed ventriculomegaly, stable ICH and IVH 2D Echo EF 60-65% in 03/2024 LDL 38 HgbA1c 3.6 VTE prophylaxis - SCDs No antithrombotic prior to admission, now on No antithrombotic Therapy recommendations:  Pending Disposition:  Pending, palliative care on board, full code and full scope  Hx of ICH 02/2024- Perimesencephalic SAH with IVH and hydrocephalus.  Status post EVD.  Cerebral angiogram no aneurysm or AVM but diffuse anterior circulation vasospasm.  MRI Redell small couple callosum and cerebellar infarct.  EF 60 to 65%, LDL 63, A1c 4.2.  EEG sharp waves, concerning for seizure activity, put on Vimpat .  Patient has prolonged hospitalization and enterally discharged to SNF.  Hx of Seizures Concerning for seizure  during last admission for [facility] and IVH. Discharged on lacosamide   EEG generalized periodic discharges with triphasic morphology. This EEG pattern can be seen due to toxic-metabolic causes. However, due to patient's complicated history, can also be on the ictal-interictal continuum but with low suspicion for seizure recurrence.  Continue home lacosamide  100 mg twice daily  Obstructive hydrocephalus EVD in place at 10 cm of water  Management per NSGY CT and MRI no significant midline shift CT repeat 9/18 showed regressed ventriculomegaly, stable IVH  Respiratory failure  Obtunded on arrival to ED, intubated by CCM SBT/VAP per CCM - pressure support this morning  PRN Fentanyl   Appreciate CCM assistance  Hypertension Home meds:  Hydralazine , Amlodipine  and clonidine   On Cleviprex   BP goal less than 160 On amlodipine  10 and clonidine  0.3 tid and coreg  25 bid And hydralazine  100mg  TID  Wean off Cleviprex  as able  Diabetes type I Controlled Hx of pancreas transplant Home meds:  Cellcept , prednisone , and cyclosporine   HgbA1c 3.6, goal < 7.0 CBGs SSI Recommend close follow-up with PCP for better DM control  ESRD on HD Hx of renal transplant (failed) Home meds: Cellcept , prednisone , and cyclosporine   Creatinine 2.80--3.55--4.76--2.23 HD schedule is MWF Nephrology consulted  Dysphagia Patient has GJ  tube, however GJ tube clogged -> replaced by IR 9/17 On TF @ 50  Other stroke risk factors  Other medical issues Hypokalemia, K 3.1--2.8-3.6--4.5--3.8  Hospital day # 7    I have personally obtained history,examined this patient, reviewed notes, independently viewed imaging studies, participated in medical decision making and plan of care.ROS completed by me personally and pertinent positives fully documented  I have made any additions or clarifications directly to the above note. Agree with note above.  Patient blood pressure is adequately controlled.  Ventriculostomy is draining  well and neurosurgery is following.  Continue ventilatory support and wean as tolerated.  Patient prognosis is guarded given recurrent brain hemorrhage and significant underlying medical comorbidities.  Long discussion with mother at the bedside and answered questions.  Family wants to continue supportive care and hopefully she is more awake and can be extubated over the next few days as tolerated.  Family does not want tracheostomy. Discussed with Dr. Claudene critical care medicine This patient is critically ill and at significant risk of neurological worsening, death and care requires constant monitoring of vital signs, hemodynamics,respiratory and cardiac monitoring, extensive review of multiple databases, frequent neurological assessment, discussion with family, other specialists and medical decision making of high complexity.I have made any additions or clarifications directly to the above note.This critical care time does not reflect procedure time, or teaching time or supervisory time of PA/NP/Med Resident etc but could involve care discussion time.  I spent 30 minutes of neurocritical care time  in the care of  this patient.     Eather Popp, MD Medical Director [facility] St[facility] Pager: [phone] 05/26/2024 3:40 PM     To contact Stroke Continuity provider, please refer to [email or web address]. After hours, contact General Neurology

## 2024-05-26 NOTE — Progress Notes (Signed)
 Patient ID: [name] [name], female   DOB: [birth year], 43 y.o.   MRN: [id number] [facility] Progress Note   Assessment/ Plan:   1.  Acute right intracranial hemorrhage: Associated with uncontrolled hypertension and status post ventriculostomy.  Ongoing discussions regarding evaluation of goals of care. 2. ESRD: Continue hemodialysis on Monday/Wednesday/Friday schedule with orders in place for dialysis today.  Right brachiocephalic fistula in place.  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 3. Anemia: Hemoglobin/hematocrit slightly improved with ongoing overt mild loss from AVP.  Due for ESA on Wednesday. 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 Nutrition: Ongoing tube feeds, monitor electrolytes/volume status. 6. Hypertension: Blood pressure currently well-controlled multiple agents including amlodipine , carvedilol  and clonidine .  Subjective:   No acute events overnight   Objective:   BP 128/60   Pulse 90   Temp 98.6 F (37 C) (Axillary)   Resp (!) 29   Ht 5' 3 (1.6 m)   Wt 50 kg   SpO2 100%   BMI 19.53 kg/m   Physical Exam: Gen: Appears chronically ill, comfortable on ventilator CVS: Pulse regular rhythm, normal rate, S1 and S2 normal Resp: Anteriorly clear to auscultation, no rales/rhonchi Abd: Soft, flat, nontender, bowel sounds normal Ext: No lower extremity edema.  Right upper arm AV fistula with palpable thrill  Labs: BMET Recent Labs  Lab 05/19/24 1610 05/19/24 1615 05/19/24 2058 05/20/24 0456 05/20/24 1028 05/21/24 0543 05/22/24 0500 05/23/24 0445 05/25/24 0341 05/26/24 0733  NA 138 139   < > 138 138 136 133* 133* 129* 127*  K 3.1* 3.0*   < > 2.7* 3.6 4.5 3.8 4.3 5.9* 5.4*  CL 95* 97*  --  95*  --  95* 91* 91* 90* 88*  CO2 24  --   --  31  --  26 27  25 23 22   GLUCOSE 147* 154*  --  126*  --  117* 136* 116* 126* 130*  BUN 10 9  --  14  --  21* 12 29* 60* 94*  CREATININE 2.80* 2.90*  --  3.55*  --  4.76* 2.23* 3.67* 3.89* 5.19*  CALCIUM  9.3  --   --  9.7  --  9.4 9.0 9.7 10.1 10.7*  PHOS  --   --   --  2.4*  --  6.4* 3.2  --  6.0* 6.8*   < > = values in this interval not displayed.   CBC Recent Labs  Lab 05/19/24 1610 05/19/24 1615 05/23/24 0445 05/24/24 0414 05/25/24 0341 05/26/24 0733  WBC 4.8   < > 4.8 4.1 3.4* 3.9*  NEUTROABS 4.2  --   --   --   --  2.5  HGB 12.2   < > 7.8* 8.3* 8.3* 8.7*  HCT 38.5   < > 25.5* 26.8* 26.7* 27.7*  MCV 98.2   < > 99.6 98.9 97.4 97.2  PLT 114*   < > 81* 100* 113* 147*   < > = values in this interval not displayed.      Medications:     amLODipine   10 mg Per Tube Daily   carvedilol   50 mg Per Tube BID WC   Chlorhexidine  Gluconate Cloth  6 each Topical Q0600   cloNIDine   0.3 mg Per Tube TID   cycloSPORINE   200 mg Per Tube QPM   cycloSPORINE   225 mg Per Tube Daily   feeding supplement (PROSource TF20)  60 mL Per Tube Daily   heparin  injection (subcutaneous)  5,000 Units Subcutaneous Q12H   hydrALAZINE   100 mg Per Tube Q8H   mycophenolate   500 mg Per Tube BID   mouth rinse  15 mL Mouth Rinse Q2H   pantoprazole  (PROTONIX ) IV  40 mg Intravenous QHS   polyethylene glycol  17 g Per Tube Daily   predniSONE   15 mg Per Tube Q breakfast   sennosides  5 mL Per Tube BID   sodium zirconium cyclosilicate   10 g Per Tube TID   Gordy Blanch, MD 05/26/2024, 10:54 AM

## 2024-05-26 NOTE — Progress Notes (Addendum)
 Daily Progress Note   Patient Name: [name] [name]       Date: 05/26/2024 DOB: 1981/08/22  Age: 43 y.o. MRN#: [id number] Attending Physician: Claudene Toribio BROCKS, MD Primary Care Physician: [facility] [place] [facility] Admit Date: 05/19/2024  Reason for Consultation/Follow-up: Establishing goals of care  Patient Profile/HPI:    43 y.o. female  with past medical history of ESRD on HD, failed renal transplant, pancreatic transplant, DM1, gastroparesis recent admission 6/11-7/28 for ICH with d/c to [facility] and eventual d/c to [facility] on 8/13, admitted on 05/19/2024 from dialysis with code stroke. Workup revealed recurrent large intraparenchymal hemorrhage. EVD placed by neurosurgery. Palliative consulted for medical decision making.   Surrogate decision maker- HCPOA document on chart indicates [name] [name] to be surrogate decision maker however [name] [name] has been very difficult to reach and did not come for meeting today. I attempted to call her and received no return call. Patient's mother has been constantly at bedside. Per Palliative NP Camellia Kays- when [facility] document was completed it was due to patient's mother being in [place]. Because mother is here and readily available, and because [name] [name] has been neglectful in duties of HCPOA- not returning phone calls, not showing for planned GOC discussion- then it is appropriate to defer decision making to patient's mother   Subjective:  Chart reviewed including labs, progress notes, imaging from this and previous encounters. Discussed with attending Dr. Claudene. She is tolerating SBP, but having some issues with secretions, cough, concerns re: ability to maintain airway off vent.  Met at bedside with Dr. Claudene and patient's mother for further goals of  care discussion.  Reviewed patient's multiple comorbidities and low likelihood of return to independent functional state. We discussed concept of quality of life vs quantity of life. Inquired about goals for quality of life for [facility]. Inquired about if there would be point where [place] may not want to prolong her life artificially.  Mom was very clear that in their culture and their faith they believe in prolonging life with all interventions no matter what condition a person is in. She notes that she wants to have her daughter kept alive no matter what state she is in or what her quality of life is. This is a very strong faith held belief.   Review of Systems  Unable to  perform ROS: Mental status change     Physical Exam Vitals and nursing note reviewed.  Constitutional:      Appearance: She is ill-appearing.  Cardiovascular:     Rate and Rhythm: Normal rate.  Pulmonary:     Comments: intubated Skin:    Coloration: Skin is pale.             Vital Signs: BP 128/60   Pulse 90   Temp 98.6 F (37 C) (Axillary)   Resp (!) 29   Ht 5' 3 (1.6 m)   Wt 50 kg   SpO2 100%   BMI 19.53 kg/m  SpO2: SpO2: 100 % O2 Device: O2 Device: Ventilator O2 Flow Rate:    Intake/output summary:  Intake/Output Summary (Last 24 hours) at 05/26/2024 1040 Last data filed at 05/26/2024 1000 Gross per 24 hour  Intake 1710.59 ml  Output 242 ml  Net 1468.59 ml   LBM: Last BM Date : 05/25/24 Baseline Weight: Weight: 54.6 kg Most recent weight: Weight: 50 kg       Palliative Assessment/Data: PPS: 10% (with g-tube feeding)      Patient Active Problem List   Diagnosis Date Noted   ICH (intracerebral hemorrhage) (HCC) 05/19/2024   Pontine hemorrhage (HCC) 02/15/2024   Malnutrition of moderate degree 02/15/2024   SAH (subarachnoid hemorrhage) (HCC) 02/15/2024   Colitis 02/14/2024   History of diabetic gastroparesis 02/14/2024   History of pancreatic islet cell transplantation 02/14/2024   Acute  metabolic encephalopathy 02/14/2024   Prolonged QT interval 02/14/2024   Controlled type 2 diabetes mellitus with complication, without long-term current use of insulin  (HCC) 02/14/2024   Sepsis (HCC) 02/14/2024   Chronic, continuous use of opioids 11/12/2023   Need for acute hemodialysis due to missed session(HCC) 11/12/2023   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/12/2023   Vaginal bleeding 06/13/2023   ESRD needing dialysis (HCC) 03/07/2022   Chronic pain 03/07/2022   ESRD (end stage renal disease) (HCC)    Hypertensive emergency 01/31/2022   Hypertensive crisis 01/30/2022   Hypervolemia associated with renal insufficiency 12/23/2021   Hemorrhagic gastritis    Subdural hematoma (HCC) 12/02/2021   Acute blood loss anemia 12/02/2021   Chronic abdominal pain 12/02/2021   Thrombocytopenia (HCC) 12/02/2021   Abnormal CT of the abdomen 12/02/2021   Type 1 diabetes (HCC) 12/02/2021   Fever 11/07/2021   Acute encephalopathy 11/06/2021   Pulmonary edema 11/06/2021   Hypertensive urgency 11/06/2021   Type 2 diabetes mellitus with peripheral neuropathy (HCC) 11/06/2021   History of renal transplant 11/06/2021   COVID-19 virus infection 11/06/2021   HTN (hypertension), malignant 08/02/2021   ESRD on dialysis (HCC) 02/25/2021   Coagulation defect, unspecified (HCC) 12/22/2020   Acute cough 10/04/2020   Allergy, unspecified, initial encounter 05/21/2020   Anaphylactic shock, unspecified, initial encounter 05/21/2020   Pancytopenia (HCC) 02/27/2020   Shortness of breath 04/23/2019   Hypocalcemia 12/30/2018   Aortic atherosclerosis (HCC) 12/27/2018   Acute respiratory failure with hypoxia (HCC) 12/22/2018   Hyperkalemia    Bilateral pneumonia 12/21/2018   Pancreas transplant status (HCC) 12/21/2018   Immunosuppression (HCC) 12/21/2018   Chest pain 12/21/2018   CAP (community acquired pneumonia) 12/20/2018   Other staphylococcus as the cause of diseases  classified elsewhere 08/16/2018   Mild protein-calorie malnutrition (HCC) 04/18/2018   Encounter for screening for respiratory tuberculosis 04/06/2018   Gout 04/06/2018   Iron  deficiency anemia, unspecified 04/06/2018   Leiomyoma of uterus, unspecified 04/06/2018   Secondary hyperparathyroidism  of renal origin (HCC) 04/06/2018   Acute rejection of kidney transplant 07/11/2017   Elevated serum creatinine 06/27/2017   E. coli UTI 12/18/2014   Transplant rejection 09/03/2014   Cyst of ovary, left 09/01/2014   Immunosuppressive management encounter following kidney transplant 08/26/2014   Acne 04/28/2014   Itch of skin 04/28/2014   Diabetic gastroparesis associated with type 1 diabetes mellitus (HCC) 11/07/2013   Nausea and vomiting 11/07/2013   Lesion of spleen 10/07/2013   Metabolic acidosis 09/25/2013   Patient's noncompliance with other medical treatment and regimen 09/12/2013   Narcotic dependence (HCC) 08/31/2013   Generalized pain 08/30/2013   Hypomagnesemia 08/30/2013   Failed kidney transplant 08/21/2013   Hypophosphatemia 07/28/2013   End stage renal disease (HCC) 02/06/2012   Pleural effusion, bilateral 11/16/2011   Anemia in chronic kidney disease (CKD) 11/16/2011   Pericardial effusion 11/15/2011   Fluid overload 11/15/2011   Abdominal pain, epigastric 11/15/2011   CKD (chronic kidney disease) stage 5, GFR less than 15 ml/min (HCC) 11/03/2011   Hypertension 10/27/2011   Renal failure, chronic 10/27/2011   MEDIAL EPICONDYLITIS, LEFT 12/17/2007   ABSCESS, AXILLA, LEFT 12/03/2007   Type 2 diabetes mellitus with other diabetic kidney complication (HCC) 11/05/2007    Palliative Care Assessment & Plan    Assessment/Recommendations/Plan  Large parenchymal hemorrhage with extension into ventricles- s/p EVD placement, intubated, had g-tube prior to admission Continue full scope, full care- patient's mother with very strong faith/cultural belief in all life prolonging  measures- she is unlikely to set any limits to care PMT will continue to follow   Code Status:   Code Status: Full Code   Prognosis:  Unable to determine  Discharge Planning: To Be Determined  Care plan was discussed with patient's mother  Thank you for allowing the Palliative Medicine Team to assist in the care of this patient.  Total time:  Prolonged billing:  Time includes:   Preparing to see the patient (e.g., review of tests) Obtaining and/or reviewing separately obtained history Performing a medically necessary appropriate examination and/or evaluation Counseling and educating the patient/family/caregiver Ordering medications, tests, or procedures Referring and communicating with other health care professionals (when not reported separately) Documenting clinical information in the electronic or other health record Independently interpreting results (not reported separately) and communicating results to the patient/family/caregiver Care coordination (not reported separately) Clinical documentation  Cassondra Stain, AGNP-C Palliative Medicine   Please contact Palliative Medicine Team phone at [phone] for questions and concerns.

## 2024-05-26 NOTE — Progress Notes (Signed)
 Subjective: The patient is intubated.  She is in no apparent distress.  Her mother is at the bedside.  Objective: Vital signs in last 24 hours: Temp:  [98.4 F (36.9 C)-100.5 F (38.1 C)] 98.6 F (37 C) (09/22 0700) Pulse Rate:  [79-96] 88 (09/22 0800) Resp:  [0-35] 27 (09/22 0800) BP: (125-161)/(61-87) 157/66 (09/22 0800) SpO2:  [100 %] 100 % (09/22 0800) Arterial Line BP: (55-191)/(41-176) 139/121 (09/21 1900) FiO2 (%):  [40 %] 40 % (09/22 0753) Weight:  [50 kg] 50 kg (09/22 0500) Estimated body mass index is 19.53 kg/m as calculated from the following:   Height as of this encounter: 5' 3 (1.6 m).   Weight as of this encounter: 50 kg.   Intake/Output from previous day: 09/21 0701 - 09/22 0700 In: 1705 [I.V.:204.6; NG/GT:1270; IV Piggyback:200.4] Out: 224 [Drains:224] Intake/Output this shift: Total I/O In: 50 [NG/GT:50] Out: 10 [Drains:10]  Physical exam: The patient opens her eyes to voice.  She is attentive.  Her ventriculostomy is patent.  It is draining over 200 cc/day.  Lab Results: Recent Labs    05/25/24 0341 05/26/24 0733  WBC 3.4* 3.9*  HGB 8.3* 8.7*  HCT 26.7* 27.7*  PLT 113* 147*   BMET Recent Labs    05/25/24 0341 05/26/24 0733  NA 129* 127*  K 5.9* 5.4*  CL 90* 88*  CO2 23 22  GLUCOSE 126* 130*  BUN 60* 94*  CREATININE 3.89* 5.19*  CALCIUM  10.1 10.7*    Studies/Results: DG Chest Port 1 View Result Date: 05/26/2024 EXAM: 1 VIEW XRAY OF THE CHEST 05/26/2024 08:44:00 AM COMPARISON: 05/20/2024 CLINICAL HISTORY: SOB (shortness of breath) 141880. Reason for exam: SOB FINDINGS: LINES, TUBES AND DEVICES: Endotracheal tube in place with tip 2.6 cm above carina. Stable left subclavian line with tip in the projection of the superior cavoatrial junction. Enteric tube removed. LUNGS AND PLEURA: No focal pulmonary opacity. No pulmonary edema. No pleural effusion. No pneumothorax. HEART AND MEDIASTINUM: Cardiomegaly. No acute abnormality of the  mediastinal silhouette. BONES AND SOFT TISSUES: No acute osseous abnormality. IMPRESSION: 1. No acute cardiopulmonary process. 2. Endotracheal tube with tip 2.6 cm above the carina. 3. Stable left subclavian central venous catheter with tip at the superior cavoatrial junction. 4. Enteric tube removed. Electronically signed by: Waddell Calk MD 05/26/2024 09:02 AM EDT RP Workstation: [id number]    Assessment/Plan: Intracerebral hemorrhage, interventricular hemorrhage, hydrocephalus, ventriculostomy: The patient has improved clinically.  We will plan to continue her ventriculostomy a few more days before attempting to elevate and possibly remove it.  I have answered all the patient's mother's questions.  LOS: 7 days     [name] [name] 05/26/2024, 9:47 AM     Patient ID: [name] [name], female   DOB: [birth year], 43 y.o.   MRN: [id number]

## 2024-05-26 NOTE — Progress Notes (Signed)
 NAME:  [name] [name], MRN:  [id number], DOB:  [birth year], LOS: 7 ADMISSION DATE:  05/19/2024 CONSULTATION DATE:  9/15/20025 REFERRING MD: Patsey - EDP, CHIEF COMPLAINT:  Code Stroke, ICH   History of Present Illness:  43 year old woman who presented to [facility] ED 9/15 as a Code Stroke. 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 Recent admissions 6/11-7/28 and 7/29-8/13 for ICH.  Patient presented to [facility] ED from dialysis as a Code Stroke; reportedly LKW 1450 with development of obtundation and hemiparesis. SBP 200s. NIHSS 31 with initial leftward gaze, decreased LOC/disorientation, bilateral facial droop/arm weakness/leg weakness, global aphasia, dysarthria, visual neglect. CT Head demonstrated large volume IVH with moderate hydrocephalus, acute hemorrhage of medial R temporal lobe and small foci of residual/recurrent hemorrhage in the L cerebellar hemisphere.   Patient was seen by stroke team and neurosurgery, EVD was placed on left side, PCCM was consulted for medical evaluation and admission to neuro ICU  Pertinent Medical History:   Past Medical History:  Diagnosis Date   Anemia    Anxiety    DM (diabetes mellitus), type 1 (HCC)    ESRD on hemodialysis (HCC)    Gastroparesis    History of simultaneous kidney and pancreas transplant (HCC) 2014   Hypertension    Ischemic cerebrovascular accident (CVA) (HCC) 11/2021   Narcotic abuse (HCC)    Non compliance with medical treatment    Stroke [facility])    Vision loss    Significant Hospital Events: Including procedures, antibiotic start and stop dates in addition to other pertinent events   9/15 admitted with ICH plus IVH, EVD was placed for hydrocephalus 9/16 remain on clevidipine  infusion, tolerating spontaneous breathing trial, EVD is draining bloody fluid 9/17 continue to  require clevidipine  infusion, tolerated spontaneous breathing trial 9/18: No overnight issues. Currently off sedation. Still on high-dose `clevidipine  infusion 9/19: this am, dialysis ongoing. Neurosx plans to continue ventriculostomy for now. Remains on cleviprex  to maintain BP goal. Tmax 100.1. despite plt decrease 4T score 0. Will maintain heparin  as is for now and closely follow trend.   Interim History / Subjective:  No events.  Objective:  Blood pressure (!) 141/68, pulse 88, temperature (!) 100.5 F (38.1 C), temperature source Axillary, resp. rate (!) 22, height 5' 3 (1.6 m), weight 50 kg, SpO2 100%.    Vent Mode: PRVC FiO2 (%):  [40 %] 40 % Set Rate:  [12 bmp] 12 bmp Vt Set:  [490 mL] 490 mL PEEP:  [5 cmH20] 5 cmH20 Pressure Support:  [12 cmH20] 12 cmH20 Plateau Pressure:  [19 cmH20] 19 cmH20   Intake/Output Summary (Last 24 hours) at 05/26/2024 0725 Last data filed at 05/26/2024 0700 Gross per 24 hour  Intake 1704.98 ml  Output 214 ml  Net 1490.98 ml   Filed Weights   05/24/24 0424 05/25/24 0500 05/26/24 0500  Weight: 49.4 kg 49.2 kg 50 kg   Physical Examination: Chronically ill appearing EVD blood tinged minimal output +rhonci and low lung compliance stable Abd soft No edema Will do neuro eval later this am when mom wakes up, did not want to disturb  AM labs pending  Assessment & Plan:  Acute right parietotemporal intraparenchymal hemorrhage with IVH with ICH score 2 due to uncontrolled hypertension Hypertensive emergency Obstructive hydrocephalus status post EVD Acute left parietal ischemic stroke was ruled out Acute encephalopathy in the setting of ICH and hydrocephalus  Acute respiratory insufficiency Serratia/klebsiella pneumonia End-stage renal disease on hemodialysis with failed renal transplant but functional pancreatic transplant on immunosuppressive therapy Hypervolemic hyponatremia, hypochloremia  Type 1 diabetes, complicated with vasculopathy and  gastroparesis- not on long term insulin  anymore Narcotic abuse Moderate protein calorie malnutrition Anemia, thrombocytopenia- am labs pending Stage 1 pressure ulcer unclear if POA  - EVD per neurosurgery - AEDs per neuro - Usual neuroprotective measures - Vent bundle, check CXR - iHD, lokelma  per neurology: awaiting am labs - SBP goal < 160, continue clonidine , amlodipine , hydralazine , coreg ; may add ARB depending on AM K - continue steroids, cyclosporine , and cellcept  for pancreatic tx - SSI - cefepime  through 05/30/24; watch fever curve - mother updated 9/21, does not want trach unless absolutely necessary - I think a multidisciplinary discussion with PMT, CCM, and mother would be helpful to discuss what life would look like if we went down the trach/ PEG/ iHD [place] placement; appreciate PMT help setting this up  31 min cc time Rolan Sharps MD PCCM  Patient Lines/Drains/Airways Status     Active Line/Drains/Airways     Name Placement date Placement time Site Days   Peripheral IV 05/19/24 22 G 2.5 Left;Anterior Forearm 05/19/24  2251  Forearm  7   CVC Triple Lumen 05/20/24 Left Subclavian 05/20/24  1413  -- 6   Fistula / Graft Right Upper arm Arteriovenous fistula 12/08/20  1403  Upper arm  1265   Gastrostomy/Enterostomy PEG-jejunostomy 18 Fr. LUQ 05/21/24  0827  LUQ  5   ICP/Ventriculostomy Ventricular drainage catheter Left Parietal region 05/19/24  1730  Parietal region  7   Fecal Management System 35 mL 05/25/24  1630  -- 1   Airway 7.5 mm 05/19/24  1842  -- 7   Wound 05/19/24 1933 Pressure Injury Buttocks Bilateral Stage 1 -  Intact skin with non-blanchable redness of a localized area usually over a bony prominence. 05/19/24  1933  Buttocks  7

## 2024-05-27 DIAGNOSIS — G911 Obstructive hydrocephalus: Secondary | ICD-10-CM | POA: Diagnosis not present

## 2024-05-27 DIAGNOSIS — I120 Hypertensive chronic kidney disease with stage 5 chronic kidney disease or end stage renal disease: Secondary | ICD-10-CM | POA: Diagnosis not present

## 2024-05-27 DIAGNOSIS — I611 Nontraumatic intracerebral hemorrhage in hemisphere, cortical: Secondary | ICD-10-CM | POA: Diagnosis not present

## 2024-05-27 DIAGNOSIS — J1569 Pneumonia due to other gram-negative bacteria: Secondary | ICD-10-CM | POA: Diagnosis not present

## 2024-05-27 DIAGNOSIS — Z992 Dependence on renal dialysis: Secondary | ICD-10-CM | POA: Diagnosis not present

## 2024-05-27 DIAGNOSIS — I615 Nontraumatic intracerebral hemorrhage, intraventricular: Secondary | ICD-10-CM | POA: Diagnosis not present

## 2024-05-27 DIAGNOSIS — I619 Nontraumatic intracerebral hemorrhage, unspecified: Secondary | ICD-10-CM | POA: Diagnosis not present

## 2024-05-27 DIAGNOSIS — N186 End stage renal disease: Secondary | ICD-10-CM | POA: Diagnosis not present

## 2024-05-27 LAB — MAGNESIUM: Magnesium: 2.5 mg/dL — ABNORMAL HIGH (ref 1.7–2.4)

## 2024-05-27 LAB — POCT I-STAT EG7
Acid-Base Excess: 7 mmol/L — ABNORMAL HIGH (ref 0.0–2.0)
Bicarbonate: 30.9 mmol/L — ABNORMAL HIGH (ref 20.0–28.0)
Calcium, Ion: 1.29 mmol/L (ref 1.15–1.40)
HCT: 25 % — ABNORMAL LOW (ref 36.0–46.0)
Hemoglobin: 8.5 g/dL — ABNORMAL LOW (ref 12.0–15.0)
O2 Saturation: 85 %
Patient temperature: 101.6
Potassium: 4.3 mmol/L (ref 3.5–5.1)
Sodium: 128 mmol/L — ABNORMAL LOW (ref 135–145)
TCO2: 32 mmol/L (ref 22–32)
pCO2, Ven: 41.7 mmHg — ABNORMAL LOW (ref 44–60)
pH, Ven: 7.485 — ABNORMAL HIGH (ref 7.25–7.43)
pO2, Ven: 51 mmHg — ABNORMAL HIGH (ref 32–45)

## 2024-05-27 LAB — CBC
HCT: 25.9 % — ABNORMAL LOW (ref 36.0–46.0)
Hemoglobin: 8.3 g/dL — ABNORMAL LOW (ref 12.0–15.0)
MCH: 30.6 pg (ref 26.0–34.0)
MCHC: 32 g/dL (ref 30.0–36.0)
MCV: 95.6 fL (ref 80.0–100.0)
Platelets: 159 K/uL (ref 150–400)
RBC: 2.71 MIL/uL — ABNORMAL LOW (ref 3.87–5.11)
RDW: 13.6 % (ref 11.5–15.5)
WBC: 4.6 K/uL (ref 4.0–10.5)
nRBC: 0 % (ref 0.0–0.2)

## 2024-05-27 LAB — GLUCOSE, CAPILLARY
Glucose-Capillary: 149 mg/dL — ABNORMAL HIGH (ref 70–99)
Glucose-Capillary: 142 mg/dL — ABNORMAL HIGH (ref 70–99)
Glucose-Capillary: 161 mg/dL — ABNORMAL HIGH (ref 70–99)
Glucose-Capillary: 173 mg/dL — ABNORMAL HIGH (ref 70–99)
Glucose-Capillary: 135 mg/dL — ABNORMAL HIGH (ref 70–99)
Glucose-Capillary: 152 mg/dL — ABNORMAL HIGH (ref 70–99)

## 2024-05-27 LAB — BASIC METABOLIC PANEL WITH GFR
Anion gap: 13 (ref 5–15)
BUN: 38 mg/dL — ABNORMAL HIGH (ref 6–20)
CO2: 26 mmol/L (ref 22–32)
Calcium: 9.8 mg/dL (ref 8.9–10.3)
Chloride: 93 mmol/L — ABNORMAL LOW (ref 98–111)
Creatinine, Ser: 2.57 mg/dL — ABNORMAL HIGH (ref 0.44–1.00)
GFR, Estimated: 23 mL/min — ABNORMAL LOW (ref >60)
Glucose, Bld: 141 mg/dL — ABNORMAL HIGH (ref 70–99)
Potassium: 3.6 mmol/L (ref 3.5–5.1)
Sodium: 132 mmol/L — ABNORMAL LOW (ref 135–145)

## 2024-05-27 LAB — PHOSPHORUS: Phosphorus: 3.4 mg/dL (ref 2.5–4.6)

## 2024-05-27 MED ORDER — CLONIDINE HCL 0.2 MG PO TABS
0.3000 mg | ORAL_TABLET | Freq: Three times a day (TID) | ORAL | Status: DC
  Administered 2024-05-27 – 2024-05-28 (×3): 0.3 mg
  Filled 2024-05-27 (×3): qty 1

## 2024-05-27 MED ORDER — AMLODIPINE BESYLATE 10 MG PO TABS
10.0000 mg | ORAL_TABLET | Freq: Every day | ORAL | Status: DC
  Administered 2024-05-27: 10 mg

## 2024-05-27 MED ORDER — NEPRO/CARBSTEADY PO LIQD
1000.0000 mL | ORAL | Status: DC
  Administered 2024-05-27: 1000 mL
  Filled 2024-05-27: qty 1000

## 2024-05-27 MED ORDER — ORAL CARE MOUTH RINSE
15.0000 mL | OROMUCOSAL | Status: DC
  Administered 2024-05-27 – 2024-08-09 (×242): 15 mL via OROMUCOSAL

## 2024-05-27 MED ORDER — FENTANYL CITRATE PF 50 MCG/ML IJ SOSY
PREFILLED_SYRINGE | INTRAMUSCULAR | Status: AC
  Filled 2024-05-27: qty 2

## 2024-05-27 MED ORDER — MINOXIDIL 2.5 MG PO TABS
5.0000 mg | ORAL_TABLET | Freq: Two times a day (BID) | ORAL | Status: DC
  Administered 2024-05-27 – 2024-05-28 (×3): 5 mg
  Filled 2024-05-27 (×3): qty 2

## 2024-05-27 MED ORDER — SUCCINYLCHOLINE CHLORIDE 200 MG/10ML IV SOSY
PREFILLED_SYRINGE | INTRAVENOUS | Status: AC
  Filled 2024-05-27: qty 10

## 2024-05-27 MED ORDER — ROCURONIUM BROMIDE 10 MG/ML (PF) SYRINGE
PREFILLED_SYRINGE | INTRAVENOUS | Status: AC
  Filled 2024-05-27: qty 10

## 2024-05-27 MED ORDER — SODIUM CHLORIDE 0.9 % IV SOLN
2.0000 g | INTRAVENOUS | Status: AC
  Administered 2024-05-27 – 2024-05-29 (×3): 2 g via INTRAVENOUS
  Filled 2024-05-27 (×3): qty 20

## 2024-05-27 MED ORDER — ORAL CARE MOUTH RINSE: 15.0000 mL | OROMUCOSAL | Status: DC | PRN

## 2024-05-27 MED ORDER — MIDAZOLAM HCL 2 MG/2ML IJ SOLN
INTRAMUSCULAR | Status: AC
  Filled 2024-05-27: qty 2

## 2024-05-27 MED ORDER — LACOSAMIDE 50 MG PO TABS
100.0000 mg | ORAL_TABLET | Freq: Two times a day (BID) | ORAL | Status: DC
  Administered 2024-05-27 – 2024-08-08 (×146): 100 mg
  Filled 2024-05-27 (×146): qty 2

## 2024-05-27 MED ORDER — ETOMIDATE 2 MG/ML IV SOLN
INTRAVENOUS | Status: AC
  Filled 2024-05-27: qty 20

## 2024-05-27 MED ORDER — SENNOSIDES 8.8 MG/5ML PO SYRP: 5.0000 mL | ORAL_SOLUTION | Freq: Two times a day (BID) | ORAL | Status: DC | PRN

## 2024-05-27 NOTE — Progress Notes (Signed)
 Patient extubated per MD's order with RT and RN at bedside. Patient placed on 4L [place] and tolerating well at this time. Cuff leak present prior to extubating, no stridor noted post.

## 2024-05-27 NOTE — Progress Notes (Signed)
 Nutrition Follow Up  DOCUMENTATION CODES:   Underweight, Non-severe (moderate) malnutrition in context of chronic illness  INTERVENTION:  Continue tube feeding via J tube:  Nepro @ 35 ml/h (840 ml per day) Provides 1512 kcal, 68 gm protein, 610 ml free water  daily  Liquid MVI with minerals daily  100 mg IV Thiamine daily  NUTRITION DIAGNOSIS:   Moderate Malnutrition related to chronic illness (ESRD on HD/DM and gastroparesis) as evidenced by severe muscle depletion, mild fat depletion. Remains applicable  GOAL:   Patient will meet greater than or equal to 90% of their needs Progressing  MONITOR:   Diet advancement, Vent status, Labs, I & O's, TF tolerance  REASON FOR ASSESSMENT:   Consult Enteral/tube feeding initiation and management  ASSESSMENT:  43 year old female who presented as a Code Stroke with IVH. 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 Recent admissions 6/11-7/28 and 7/29-8/13 for ICH with GJ tube.  8/12 - G tube converted to GJ  9/16 - Admitted, EVD placed, intubated 9/21 - switched to Nepro formula; FMS placed 9/23 - adjusted rate of Nepro to meet needs (was previously exceeding)  Brief Summary of past admissions: Pt with extensive prior history pt recently admitted 6/11-7/28 for ICH. During admission pt with N/V due to gastroparesis and AMS. Pt was not able to tolerate any gastric feeds via NGT and needed post pyloric access via NGT. G-tube placed but was unable to use as a GJ tube was supposed to be placed. Still receiving enteral nutrition through post pyloric NGT at this time. Was on a FLD during admission with very poor po intake. Discharged to [facility] with post pyloric NGT and tube feeds. G tube was converted to GJ at [facility] and enteral nutrition started via J-tube.  Discharged  04/01/2024 to [facility]. Since then pt seen in the ED multiple times for clogged j-tube which they were successful in unclogging it.   Pt remains ventilated with plan to wean to extubate in the next day. No family at bedside during follow up, but palliative reports family would like to continue full measures. Tube feed formula was switched to Nepro by MD 9/21, but kept at same rate (which was exceeding pt's needs). Adjusted rate to be appropriate to meet pt's needs and spoke with RN about decreasing rate. Pt's output in FMS 800 mL in last 24 hr, RN reports pt had been receiving a few medications that may have contributed to high output. Will monitor output and tolerance of adjusted tube feed rate and make any changes as needed.  MV: 10.7 L/min Temp (24hrs), Avg:99.7 F (37.6 C), Min:98.6 F (37 C), Max:101.7 F (38.7 C) MAP (cuff): 93 mmHg  Admit weight: 50.4 kg Current weight: 51.1 kg EDW: 48 kg  Last HD treatment 9/22: -2 L removed Dry wt after treatment: 48 kg   Intake/Output Summary (Last 24 hours) at 05/27/2024 0914 Last data filed at 05/27/2024 0800 Gross per 24 hour  Intake 1319.91 ml  Output 3045 ml  Net -1725.09 ml   Net IO Since Admission: 4,981.71 mL [05/27/24 0914]  Drains/Lines: EVD: 270 ml x 24 hr Last HD removal: -2L FMS: 800 mL x 24 hr  Nutritionally Relevant Medications: Versed  Protonix  Miralax  Lokelma  Cefepime    Labs Reviewed: Sodium 132/ Chloride 93 Potassium 3.6 BUN 38/Creatinine 2.57 Phosphorus 3.4<--6.8 Magnesium  2.5 GFR 23 CBG ranges from 130-214 mg/dL over the  last 24 hours   Diet Order:   Diet Order             Diet NPO time specified  Diet effective now                   EDUCATION NEEDS:   Not appropriate for education at this time  Skin:  Skin Assessment: Skin Integrity Issues: Skin Integrity Issues:: Stage II Stage II: Buttocks  Last BM:  FMS: 800 mL x 24 hr  Height:   Ht Readings from Last 1 Encounters:  05/21/24  5' 3 (1.6 m)    Weight:   Wt Readings from Last 1 Encounters:  05/27/24 51.1 kg    BMI:  Body mass index is 19.96 kg/m.  Estimated Nutritional Needs:   Kcal:  1500-1800  Protein:  60-80 g  Fluid:  1.5-1.8L   [name] Glance, MS, RDN, LDN Clinical Dietitian I Please reach out via secure chat

## 2024-05-27 NOTE — Progress Notes (Signed)
 NAME:  [name] [name], MRN:  [id number], DOB:  [birth year], LOS: 8 ADMISSION DATE:  05/19/2024 CONSULTATION DATE:  9/15/20025 REFERRING MD: Patsey - EDP, CHIEF COMPLAINT:  Code Stroke, ICH   History of Present Illness:  43 year old woman who presented to [facility] ED 9/15 as a Code Stroke. 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 Recent admissions 6/11-7/28 and 7/29-8/13 for ICH.  Patient presented to [facility] ED from dialysis as a Code Stroke; reportedly LKW 1450 with development of obtundation and hemiparesis. SBP 200s. NIHSS 31 with initial leftward gaze, decreased LOC/disorientation, bilateral facial droop/arm weakness/leg weakness, global aphasia, dysarthria, visual neglect. CT Head demonstrated large volume IVH with moderate hydrocephalus, acute hemorrhage of medial R temporal lobe and small foci of residual/recurrent hemorrhage in the L cerebellar hemisphere.   Patient was seen by stroke team and neurosurgery, EVD was placed on left side, PCCM was consulted for medical evaluation and admission to neuro ICU  Pertinent Medical History:   Past Medical History:  Diagnosis Date   Anemia    Anxiety    DM (diabetes mellitus), type 1 (HCC)    ESRD on hemodialysis (HCC)    Gastroparesis    History of simultaneous kidney and pancreas transplant (HCC) 2014   Hypertension    Ischemic cerebrovascular accident (CVA) (HCC) 11/2021   Narcotic abuse (HCC)    Non compliance with medical treatment    Stroke [facility])    Vision loss    Significant Hospital Events: Including procedures, antibiotic start and stop dates in addition to other pertinent events   9/15 admitted with ICH plus IVH, EVD was placed for hydrocephalus 9/16 remain on clevidipine  infusion, tolerating spontaneous breathing trial, EVD is draining bloody fluid 9/17 continue to  require clevidipine  infusion, tolerated spontaneous breathing trial 9/18: No overnight issues. Currently off sedation. Still on high-dose `clevidipine  infusion 9/19: this am, dialysis ongoing. Neurosx plans to continue ventriculostomy for now. Remains on cleviprex  to maintain BP goal. Tmax 100.1. despite plt decrease 4T score 0. Will maintain heparin  as is for now and closely follow trend.   Interim History / Subjective:  Wakes up and moves L side, not to command.  Objective:  Blood pressure (!) 125/57, pulse 100, temperature 99.7 F (37.6 C), temperature source Axillary, resp. rate (!) 27, height 5' 3 (1.6 m), weight 51.1 kg, SpO2 100%.    Vent Mode: PRVC FiO2 (%):  [40 %] 40 % Set Rate:  [12 bmp] 12 bmp Vt Set:  [490 mL] 490 mL PEEP:  [5 cmH20] 5 cmH20 Pressure Support:  [8 cmH20] 8 cmH20 Plateau Pressure:  [15 cmH20-18 cmH20] 15 cmH20   Intake/Output Summary (Last 24 hours) at 05/27/2024 0831 Last data filed at 05/27/2024 0800 Gross per 24 hour  Intake 1369.91 ml  Output 3060 ml  Net -1690.09 ml   Filed Weights   05/26/24 2000 05/27/24 0115 05/27/24 0446  Weight: 50 kg 48 kg 51.1 kg   Physical Examination: Chronically ill appearing EVD blood tinged minimal output Rhonci still present and small thick secretions but good cough Ext +muscle wasting Moves L, not moving R Tracks toward L L gaze preference  Assessment & Plan:  Acute right parietotemporal intraparenchymal hemorrhage with IVH with ICH score 2 due to uncontrolled hypertension Hypertensive emergency Obstructive hydrocephalus status post EVD Acute left parietal ischemic stroke was ruled out Acute encephalopathy in the setting of ICH and  hydrocephalus Acute respiratory insufficiency Serratia/klebsiella pneumonia End-stage renal disease on hemodialysis with failed renal transplant but functional pancreatic transplant on immunosuppressive therapy Hypervolemic hyponatremia, hypochloremia  Type 1 diabetes,  complicated with vasculopathy and gastroparesis- not on long term insulin  anymore Narcotic abuse Moderate protein calorie malnutrition Anemia, thrombocytopenia- am labs pending Stage 1 pressure ulcer POA  - EVD per neurosurgery - AEDs as ordered - Usual neuroprotective measures - Wean to extubate - TF per PEG - SSI - Abx to complete 9/24 - Anti-rejection meds for pancreatic transplant as ordered  Issue here is her brittle hemodynamics that may not have an answer.  When anywhere above her dry weight she has profoundly resistant HTN.  Any perturbation to her HD schedule risks recurrent intracranial hemorrhage and do not think any way to obviate this risk unless she's a PD candidate which it sounds like she is not.  If ends up reintubated would refer to [facility] for additional vent wean attempts.  Will discuss with mother depending on clinical course.  My cc time 33 mins Rolan Sharps MD PCCM

## 2024-05-27 NOTE — Progress Notes (Addendum)
 PT OT.  STROKE TEAM PROGRESS NOTE    SIGNIFICANT HOSPITAL EVENTS 9/15- Admitted with large ICH, IVH. Intubated, EVD placed 9/19 - remains off sedation  9/23 - extubated   INTERIM HISTORY/SUBJECTIVE Patient is now extubated and doing well from respiratory standpoint.  She remains drowsy opens eyes and barely follows a few commands.  Afebrile this morning. WBC normal.  Serum sodium is low. Nephrology is following and is on dialysis.  Blood pressure remains quite labile especially following dialysis.  Ventriculostomy is draining well.  Neurosurgery following  OBJECTIVE  CBC    Component Value Date/Time   WBC 4.6 05/27/2024 0452   RBC 2.71 (L) 05/27/2024 0452   HGB 8.3 (L) 05/27/2024 0452   HCT 25.9 (L) 05/27/2024 0452   PLT 159 05/27/2024 0452   MCV 95.6 05/27/2024 0452   MCH 30.6 05/27/2024 0452   MCHC 32.0 05/27/2024 0452   RDW 13.6 05/27/2024 0452   LYMPHSABS 0.7 05/26/2024 0733   MONOABS 0.5 05/26/2024 0733   EOSABS 0.1 05/26/2024 0733   BASOSABS 0.0 05/26/2024 0733    BMET    Component Value Date/Time   NA 132 (L) 05/27/2024 0452   K 3.6 05/27/2024 0452   CL 93 (L) 05/27/2024 0452   CO2 26 05/27/2024 0452   GLUCOSE 141 (H) 05/27/2024 0452   BUN 38 (H) 05/27/2024 0452   CREATININE 2.57 (H) 05/27/2024 0452   CALCIUM  9.8 05/27/2024 0452   GFRNONAA 23 (L) 05/27/2024 0452    IMAGING past 24 hours No results found.    Vitals:   05/27/24 0500 05/27/24 0600 05/27/24 0700 05/27/24 0800  BP: (!) 103/56 (!) 95/58 (!) 106/59 (!) 125/57  Pulse: 100     Resp: (!) 22 16 15  (!) 27  Temp:      TempSrc:      SpO2: 100%     Weight:      Height:         PHYSICAL EXAM General: Drowsy but can be aroused but not following commands CV: Regular rate and rhythm on monitor Respiratory: 4L [place] GI: GJ tube in place   NEURO:  Mental Status: Eyes open with noxious stimuli, not following commands. Appears to intermittently mouth words. Tracks examiner Cranial Nerves:  II:  PERRL.  III, IV, VI: Gaze disconjugate, comes to midline  VII: Face is symmetrical resting  VIII:  IX, X: Cough and gag intact XI: Head is to the right  XII: tongue is midline without fasciculations. Motor: Withdraws to noxious stimuli,moves spontaneously but does not maintain antigravity strength. Left appears weaker than right.  Tone: is normal and bulk is normal Coordination deferred Gait- deferred      ASSESSMENT/PLAN  [name] [name] is a 43 y.o. female with history of hypertension, ESRD on HD status post renal and pancreatic transplants, diabetes type 1, gastroparesis, multiple strokes both ischemic and hemorrhagic along with prepontine subarachnoid hemorrhage which was angio negative, and during last admission had hydrocephalus requiring EVD placement, then discharged to a nursing facility-today had a change in mentation at dialysis where she had a leftward gaze and left-sided weakness for which EMS was called.Her blood pressures were in the systolic 200s.    ICH:  right temporal lobe ICH with IVH extension, etiology:  likely labile uncontrolled hypertension  Code Stroke CT head - Large volume acute intraventricular hemorrhage with moderate hydrocephalus. Acute hemorrhage in the medial right temporal lobe, inseparable from the intraventricular hemorrhage. Small foci of residual/recurrent hemorrhage and/or mineralization in the  left cerebellar hemisphere. 9/15- repeat CT head - No significant change in large volume of acute intraventricular hemorrhage and moderate hydrocephalus. Similar intraparenchymal hemorrhage in the right hippocampal region that is inseparable from the intraventricular hemorrhage.  MRI   Intraventricular hemorrhage with ventriculomegaly and left frontal ventriculostomy tube in place. No acute ischemic infarct. 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 MRA motion-degraded, no significant  stenosis CT repeat 9/18 showed regressed ventriculomegaly, stable ICH and IVH 2D Echo EF 60-65% in 03/2024 LDL 38 HgbA1c 3.6 VTE prophylaxis - SCDs No antithrombotic prior to admission, now on No antithrombotic Therapy recommendations:  Pending Disposition:  Pending, palliative care on board, full code and full scope  Hx of ICH 02/2024- Perimesencephalic SAH with IVH and hydrocephalus.  Status post EVD.  Cerebral angiogram no aneurysm or AVM but diffuse anterior circulation vasospasm.  MRI [place] small couple callosum and cerebellar infarct.  EF 60 to 65%, LDL 63, A1c 4.2.  EEG sharp waves, concerning for seizure activity, put on Vimpat .  Patient has prolonged hospitalization and enterally discharged to SNF.  Hx of Seizures Concerning for seizure during last admission for [facility] and IVH. Discharged on lacosamide   EEG generalized periodic discharges with triphasic morphology. This EEG pattern can be seen due to toxic-metabolic causes. However, due to patient's complicated history, can also be on the ictal-interictal continuum but with low suspicion for seizure recurrence.  Continue home lacosamide  100 mg twice daily  Obstructive hydrocephalus EVD in place at 10 cm of water  Management per NSGY CT and MRI no significant midline shift CT repeat 9/18 showed regressed ventriculomegaly, stable IVH  Respiratory failure  Obtunded on arrival to ED, intubated by CCM SBT/VAP per CCM - pressure support this morning  PRN Fentanyl   Appreciate CCM assistance  Hypertension Home meds:  Hydralazine , Amlodipine  and clonidine   On Cleviprex   BP goal less than 160 On amlodipine  10 and clonidine  0.3 tid and coreg  25 bid And hydralazine  100mg  TID  Wean off Cleviprex  as able  Diabetes type I Controlled Hx of pancreas transplant Home meds:  Cellcept , prednisone , and cyclosporine   HgbA1c 3.6, goal < 7.0 CBGs SSI Recommend close follow-up with PCP for better DM control  ESRD on HD Hx of renal transplant  (failed) Home meds: Cellcept , prednisone , and cyclosporine   Creatinine 2.80--3.55--4.76--2.23 HD schedule is MWF Nephrology consulted  Dysphagia Patient has GJ tube, however GJ tube clogged -> replaced by IR 9/17 On TF @ 50  Other medical issues Potassium stable - 5.9 -> 3.6  TF changed to nepro by nephrology  HD MWF  Hospital day # 8  Patient seen and examined by NP/APP with MD. MD to update note as needed.   Jorene Last, DNP, FNP-BC [name] Pager: [phone]  I have personally obtained history,examined this patient, reviewed notes, independently viewed imaging studies, participated in medical decision making and plan of care.ROS completed by me personally and pertinent positives fully documented  I have made any additions or clarifications directly to the above note. Agree with note above.  Continue close blood pressure monitoring with systolic goal below 160.  Continue ventriculostomy drainage wean off ventriculostomy as per neurosurgery.  Stroke team will sign off.  Kindly call for questions.  No family at the bedside.  Discussed with Dr. Claudene. This patient is critically ill and at significant risk of neurological worsening, death and care requires constant monitoring of vital signs, hemodynamics,respiratory and cardiac monitoring, extensive review of multiple databases,  frequent neurological assessment, discussion with family, other specialists and medical decision making of high complexity.I have made any additions or clarifications directly to the above note.This critical care time does not reflect procedure time, or teaching time or supervisory time of PA/NP/Med Resident etc but could involve care discussion time.  I spent 30 minutes of neurocritical care time  in the care of  this patient.     Eather Popp, MD Medical Director [facility] Pager: [phone] 05/27/2024 4:21 PM     To contact Stroke Continuity provider, please refer to  [email or web address]. After hours, contact General Neurology

## 2024-05-27 NOTE — Progress Notes (Signed)
 Received patient in bed to unit. HD TX at Bedside Alert and oriented. Unable to assess-Intubated on ventilator Informed consent signed and in chart.   TX duration: 3.5  Patient tolerated well. UF as tolerated per B/P Transported back to the room N/A Alert, without acute distress.  Hand-off given to patient's nurse. Lyle Norlander, RN  Access used: Right AVF Access issues: None  Total UF removed: 2000 Medication(s) given: Per Insurance account manager HD VS: T98.7-HR [id number]-RR21-B/P124/63 Post HD weight: 48Kg  Neville Seip, RN Kidney Dialysis Unit   05/27/24 0115  Vitals  Temp 98.7 F (37.1 C)  Temp Source Axillary  BP 124/63  MAP (mmHg) 79  BP Location Left Arm  BP Method Automatic  Patient Position (if appropriate) Lying  Pulse Rate 95  Pulse Rate Source Monitor  ECG Heart Rate 92  Resp (!) 21  Weight 48 kg  Type of Weight Post-Dialysis  Oxygen  Therapy  SpO2 100 %  O2 Device Ventilator  During Treatment Monitoring  Blood Flow Rate (mL/min) 0 mL/min  Arterial Pressure (mmHg) -38.58 mmHg  Venous Pressure (mmHg) 103.02 mmHg  TMP (mmHg) 13.73 mmHg  Ultrafiltration Rate (mL/min) 757 mL/min  Dialysate Flow Rate (mL/min) 300 ml/min  Dialysate Potassium Concentration 2  Dialysate Calcium  Concentration 2.5  Duration of HD Treatment -hour(s) 3.5 hour(s)  Cumulative Fluid Removed (mL) per Treatment  2000.21  HD Safety Checks Performed Yes  Intra-Hemodialysis Comments Tx completed  Post Treatment  Dialyzer Clearance Clear  Liters Processed 73.5  Fluid Removed (mL) 2000 mL  Tolerated HD Treatment Yes  AVG/AVF Arterial Site Held (minutes) 10 minutes  AVG/AVF Venous Site Held (minutes) 10 minutes  Fistula / Graft Right Upper arm Arteriovenous fistula  Placement Date/Time: (c) 12/08/20 1403   Placed prior to admission: Yes  Orientation: Right  Access Location: Upper arm  Access Type: Arteriovenous fistula  Site Condition No complications  Fistula / Graft Assessment  Present;Thrill;Bruit  Status Deaccessed  Needle Size 15G  Drainage Description None

## 2024-05-27 NOTE — TOC Progression Note (Addendum)
 Transition of Care [facility]) - Progression Note    Patient Details  Name: [name] [name] MRN: [id number] Date of Birth: 01-08-1981  Transition of Care [facility]) CM/SW Contact  Inocente GORMAN Kindle, LCSW Phone Number: 05/27/2024, 10:35 AM  Clinical Narrative:    CSW continuing to follow for discharge needs.    Expected Discharge Plan: Skilled Nursing Facility Barriers to Discharge: Continued Medical Work up, English as a second language teacher               Expected Discharge Plan and Services In-house Referral: Clinical Social Work   Post Acute Care Choice: Skilled Nursing Facility Living arrangements for the past 2 months: Apartment                                       Social Drivers of Health (SDOH) Interventions SDOH Screenings   Food Insecurity: Patient Unable To Answer (05/20/2024)  Housing: Patient Unable To Answer (05/20/2024)  Transportation Needs: Patient Unable To Answer (05/20/2024)  Utilities: Patient Unable To Answer (05/20/2024)  Social Connections: Unknown (10/04/2022)   Received from [facility]  Stress: No Stress Concern Present (04/16/2024)   Received from [facility]  Tobacco Use: Medium Risk (04/27/2024)    Readmission Risk Interventions    05/20/2024    4:16 PM 03/31/2024   11:27 AM 11/17/2021   12:15 PM  Readmission Risk Prevention Plan  Transportation Screening Complete Complete Complete  Medication Review Oceanographer) Complete Complete Complete  PCP or Specialist appointment within 3-5 days of discharge Complete  Complete  HRI or Home Care Consult Complete Complete Complete  SW Recovery Care/Counseling Consult Complete Complete Complete  Palliative Care Screening Complete Not Applicable Not Applicable  Skilled Nursing Facility Complete Not Applicable Patient Refused

## 2024-05-27 NOTE — Progress Notes (Signed)
 Patient ID: [name] [name], female   DOB: [birth year], 43 y.o.   MRN: [id number] [facility] Progress Note   Assessment/ Plan:   1.  Acute right intracranial hemorrhage: Associated with uncontrolled hypertension and status post ventriculostomy.  Extubated overnight and plans noted by neurosurgery for possible elevation of patient's ventriculostomy/removal later in the week. 2. ESRD: Continue hemodialysis on Monday/Wednesday/Friday schedule with next dialysis ordered for tomorrow via right BCF.  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 3. Anemia: Hemoglobin/hematocrit slightly improved with ongoing overt mild loss from AVP.  Due for ESA on Wednesday. 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 Nutrition: Ongoing tube feeds, monitor electrolytes/volume status. 6. Hypertension: Blood pressure currently well-controlled multiple agents including amlodipine , carvedilol  and clonidine .  Subjective:   Extubated overnight and has done well so far on oxygen  via nasal cannula   Objective:   BP (!) 125/57   Pulse 100   Temp 99.7 F (37.6 C) (Axillary) Comment: gave prn TYLENOL   Resp (!) 27   Ht 5' 3 (1.6 m)   Wt 51.1 kg   SpO2 100%   BMI 19.96 kg/m   Physical Exam: Gen: Somnolent/unresponsive, on oxygen  via nasal cannula CVS: Pulse regular rhythm, normal rate, S1 and S2 normal Resp: Anteriorly clear to auscultation, no rales/rhonchi Abd: Soft, flat, nontender, bowel sounds normal Ext: No lower extremity edema.  Right upper arm AV fistula with palpable thrill  Labs: BMET Recent Labs  Lab 05/20/24 1028 05/21/24 0543 05/22/24 0500 05/23/24 0445 05/25/24 0341 05/26/24 0733 05/27/24 0452  NA 138 136 133* 133* 129* 127* 132*  K 3.6 4.5 3.8 4.3 5.9* 5.4* 3.6  CL  --  95* 91* 91* 90* 88*  93*  CO2  --  26 27 25 23 22 26   GLUCOSE  --  117* 136* 116* 126* 130* 141*  BUN  --  21* 12 29* 60* 94* 38*  CREATININE  --  4.76* 2.23* 3.67* 3.89* 5.19* 2.57*  CALCIUM   --  9.4 9.0 9.7 10.1 10.7* 9.8  PHOS  --  6.4* 3.2  --  6.0* 6.8* 3.4   CBC Recent Labs  Lab 05/24/24 0414 05/25/24 0341 05/26/24 0733 05/27/24 0452  WBC 4.1 3.4* 3.9* 4.6  NEUTROABS  --   --  2.5  --   HGB 8.3* 8.3* 8.7* 8.3*  HCT 26.8* 26.7* 27.7* 25.9*  MCV 98.9 97.4 97.2 95.6  PLT 100* 113* 147* 159      Medications:     amLODipine   10 mg Per Tube Daily   carvedilol   50 mg Per Tube BID WC   Chlorhexidine  Gluconate Cloth  6 each Topical Q0600   cloNIDine   0.3 mg Per Tube TID   cycloSPORINE   200 mg Per Tube QPM   cycloSPORINE   225 mg Per Tube Daily   etomidate        fentaNYL        heparin  injection (subcutaneous)  5,000 Units Subcutaneous Q12H   hydrALAZINE   100 mg Per Tube Q8H   lacosamide   100 mg Per Tube BID   midazolam        minoxidil   5 mg Per Tube BID   mycophenolate   500 mg Per Tube BID   mouth rinse  15 mL Mouth Rinse Q2H   pantoprazole  (PROTONIX ) IV  40 mg Intravenous QHS   polyethylene glycol  17  g Per Tube Daily   predniSONE   15 mg Per Tube Q breakfast   rocuronium        succinylcholine        Gordy Blanch, MD 05/27/2024, 9:54 AM

## 2024-05-27 NOTE — Progress Notes (Signed)
 Subjective: The patient is debated and in no apparent distress.  Objective: Vital signs in last 24 hours: Temp:  [98.6 F (37 C)-101.7 F (38.7 C)] 99.7 F (37.6 C) (09/23 0400) Pulse Rate:  [83-102] 100 (09/23 0500) Resp:  [13-29] 15 (09/23 0700) BP: (93-174)/(52-75) 106/59 (09/23 0700) SpO2:  [100 %] 100 % (09/23 0500) FiO2 (%):  [40 %] 40 % (09/23 0500) Weight:  [48 kg-51.1 kg] 51.1 kg (09/23 0446) Estimated body mass index is 19.96 kg/m as calculated from the following:   Height as of this encounter: 5' 3 (1.6 m).   Weight as of this encounter: 51.1 kg.   Intake/Output from previous day: 09/22 0701 - 09/23 0700 In: 1319.9 [NG/GT:1150; IV Piggyback:169.9] Out: 3070 [Drains:270; Stool:800] Intake/Output this shift: No intake/output data recorded.  Physical exam the patient will open her eyes to stimuli.  She moves her left side more than the right.  Her ventriculostomy is patent.  Lab Results: Recent Labs    05/26/24 0733 05/27/24 0452  WBC 3.9* 4.6  HGB 8.7* 8.3*  HCT 27.7* 25.9*  PLT 147* 159   BMET Recent Labs    05/26/24 0733 05/27/24 0452  NA 127* 132*  K 5.4* 3.6  CL 88* 93*  CO2 22 26  GLUCOSE 130* 141*  BUN 94* 38*  CREATININE 5.19* 2.57*  CALCIUM  10.7* 9.8    Studies/Results: DG Chest Port 1 View Result Date: 05/26/2024 EXAM: 1 VIEW XRAY OF THE CHEST 05/26/2024 08:44:00 AM COMPARISON: 05/20/2024 CLINICAL HISTORY: SOB (shortness of breath) 141880. Reason for exam: SOB FINDINGS: LINES, TUBES AND DEVICES: Endotracheal tube in place with tip 2.6 cm above carina. Stable left subclavian line with tip in the projection of the superior cavoatrial junction. Enteric tube removed. LUNGS AND PLEURA: No focal pulmonary opacity. No pulmonary edema. No pleural effusion. No pneumothorax. HEART AND MEDIASTINUM: Cardiomegaly. No acute abnormality of the mediastinal silhouette. BONES AND SOFT TISSUES: No acute osseous abnormality. IMPRESSION: 1. No acute  cardiopulmonary process. 2. Endotracheal tube with tip 2.6 cm above the carina. 3. Stable left subclavian central venous catheter with tip at the superior cavoatrial junction. 4. Enteric tube removed. Electronically signed by: Waddell Calk MD 05/26/2024 09:02 AM EDT RP Workstation: [id number]    Assessment/Plan: Cerebral hemorrhage, intraventricular hemorrhage, hydrocephalus: We attempt to elevate the patient's ventriculostomy later this week and hopefully remove it.  LOS: 8 days     [name] [name] 05/27/2024, 7:40 AM     Patient ID: [name] [name], female   DOB: 1980/12/02, 43 y.o.   MRN: [id number]

## 2024-05-28 DIAGNOSIS — I161 Hypertensive emergency: Secondary | ICD-10-CM | POA: Diagnosis not present

## 2024-05-28 DIAGNOSIS — E1051 Type 1 diabetes mellitus with diabetic peripheral angiopathy without gangrene: Secondary | ICD-10-CM

## 2024-05-28 DIAGNOSIS — E871 Hypo-osmolality and hyponatremia: Secondary | ICD-10-CM

## 2024-05-28 DIAGNOSIS — J9600 Acute respiratory failure, unspecified whether with hypoxia or hypercapnia: Secondary | ICD-10-CM

## 2024-05-28 DIAGNOSIS — N186 End stage renal disease: Secondary | ICD-10-CM | POA: Diagnosis not present

## 2024-05-28 DIAGNOSIS — I610 Nontraumatic intracerebral hemorrhage in hemisphere, subcortical: Secondary | ICD-10-CM | POA: Diagnosis not present

## 2024-05-28 LAB — GLUCOSE, CAPILLARY
Glucose-Capillary: 147 mg/dL — ABNORMAL HIGH (ref 70–99)
Glucose-Capillary: 172 mg/dL — ABNORMAL HIGH (ref 70–99)
Glucose-Capillary: 180 mg/dL — ABNORMAL HIGH (ref 70–99)
Glucose-Capillary: 147 mg/dL — ABNORMAL HIGH (ref 70–99)
Glucose-Capillary: 147 mg/dL — ABNORMAL HIGH (ref 70–99)
Glucose-Capillary: 144 mg/dL — ABNORMAL HIGH (ref 70–99)

## 2024-05-28 LAB — CBC
HCT: 27.2 % — ABNORMAL LOW (ref 36.0–46.0)
Hemoglobin: 8.7 g/dL — ABNORMAL LOW (ref 12.0–15.0)
MCH: 30.6 pg (ref 26.0–34.0)
MCHC: 32 g/dL (ref 30.0–36.0)
MCV: 95.8 fL (ref 80.0–100.0)
Platelets: 169 K/uL (ref 150–400)
RBC: 2.84 MIL/uL — ABNORMAL LOW (ref 3.87–5.11)
RDW: 13.2 % (ref 11.5–15.5)
WBC: 4.6 K/uL (ref 4.0–10.5)
nRBC: 0 % (ref 0.0–0.2)

## 2024-05-28 LAB — BASIC METABOLIC PANEL WITH GFR
Anion gap: 16 — ABNORMAL HIGH (ref 5–15)
BUN: 70 mg/dL — ABNORMAL HIGH (ref 6–20)
CO2: 27 mmol/L (ref 22–32)
Calcium: 10.7 mg/dL — ABNORMAL HIGH (ref 8.9–10.3)
Chloride: 88 mmol/L — ABNORMAL LOW (ref 98–111)
Creatinine, Ser: 4.06 mg/dL — ABNORMAL HIGH (ref 0.44–1.00)
GFR, Estimated: 13 mL/min — ABNORMAL LOW (ref >60)
Glucose, Bld: 143 mg/dL — ABNORMAL HIGH (ref 70–99)
Potassium: 4 mmol/L (ref 3.5–5.1)
Sodium: 131 mmol/L — ABNORMAL LOW (ref 135–145)

## 2024-05-28 LAB — MAGNESIUM: Magnesium: 2.8 mg/dL — ABNORMAL HIGH (ref 1.7–2.4)

## 2024-05-28 LAB — PHOSPHORUS: Phosphorus: 6 mg/dL — ABNORMAL HIGH (ref 2.5–4.6)

## 2024-05-28 MED ORDER — ALBUMIN HUMAN 25 % IV SOLN
25.0000 g | Freq: Once | INTRAVENOUS | Status: AC
  Administered 2024-05-28: 25 g via INTRAVENOUS

## 2024-05-28 MED ORDER — DARBEPOETIN ALFA 100 MCG/0.5ML IJ SOSY
100.0000 ug | PREFILLED_SYRINGE | INTRAMUSCULAR | Status: DC
Start: 2024-05-28 — End: 2024-06-26
  Administered 2024-05-28 – 2024-06-18 (×4): 100 ug via SUBCUTANEOUS
  Filled 2024-05-28 (×6): qty 0.5

## 2024-05-28 MED ORDER — ALBUMIN HUMAN 25 % IV SOLN
INTRAVENOUS | Status: AC
  Filled 2024-05-28: qty 100

## 2024-05-28 NOTE — Progress Notes (Signed)
 SLP Cancellation Note  Patient Details Name: Imberly Troxler MRN: [id number] DOB: April 08, 1981   Cancelled treatment:       Reason Eval/Treat Not Completed: Fatigue/lethargy limiting ability to participate. Pt not alert this am   Lorine Iannaccone, Consuelo Fitch 05/28/2024, 11:27 AM

## 2024-05-28 NOTE — Progress Notes (Addendum)
 NAME:  [name] [name], MRN:  [id number], DOB:  [birth year], LOS: 9 ADMISSION DATE:  05/19/2024 CONSULTATION DATE:  9/15/20025 REFERRING MD: Patsey - EDP, CHIEF COMPLAINT:  Code Stroke, ICH   History of Present Illness:  43 year old woman who presented to [facility] ED 9/15 as a Code Stroke. 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 Recent admissions 6/11-7/28 and 7/29-8/13 for ICH.  Patient presented to [facility] ED from dialysis as a Code Stroke; reportedly LKW 1450 with development of obtundation and hemiparesis. SBP 200s. NIHSS 31 with initial leftward gaze, decreased LOC/disorientation, bilateral facial droop/arm weakness/leg weakness, global aphasia, dysarthria, visual neglect. CT Head demonstrated large volume IVH with moderate hydrocephalus, acute hemorrhage of medial R temporal lobe and small foci of residual/recurrent hemorrhage in the L cerebellar hemisphere.   Patient was seen by stroke team and neurosurgery, EVD was placed on left side, PCCM was consulted for medical evaluation and admission to neuro ICU  Pertinent Medical History:   Past Medical History:  Diagnosis Date   Anemia    Anxiety    DM (diabetes mellitus), type 1 (HCC)    ESRD on hemodialysis (HCC)    Gastroparesis    History of simultaneous kidney and pancreas transplant (HCC) 2014   Hypertension    Ischemic cerebrovascular accident (CVA) (HCC) 11/2021   Narcotic abuse (HCC)    Non compliance with medical treatment    Stroke [facility])    Vision loss    Significant Hospital Events: Including procedures, antibiotic start and stop dates in addition to other pertinent events   9/15 admitted with ICH plus IVH, EVD was placed for hydrocephalus 9/16 remain on clevidipine  infusion, tolerating spontaneous breathing trial, EVD is draining bloody fluid 9/17 continue to  require clevidipine  infusion, tolerated spontaneous breathing trial 9/18: No overnight issues. Currently off sedation. Still on high-dose `clevidipine  infusion 9/19: this am, dialysis ongoing. Neurosx plans to continue ventriculostomy for now. Remains on cleviprex  to maintain BP goal. Tmax 100.1. despite plt decrease 4T score 0. Will maintain heparin  as is for now and closely follow trend.   Interim History / Subjective:  Wakes up and moves L side, not to command.  Objective:  Blood pressure (!) 151/64, pulse 97, temperature 98.3 F (36.8 C), temperature source Axillary, resp. rate (!) 25, height 5' 3 (1.6 m), weight 49.8 kg, SpO2 100%.        Intake/Output Summary (Last 24 hours) at 05/28/2024 0716 Last data filed at 05/28/2024 0700 Gross per 24 hour  Intake 1027.56 ml  Output 232 ml  Net 795.56 ml   Filed Weights   05/27/24 0115 05/27/24 0446 05/28/24 0500  Weight: 48 kg 51.1 kg 49.8 kg   Physical Examination: Chronically ill appearing Aox0, EVD-10, minimal output, opens eye, respond pain weaker left side EVD-minimal output Symmetrical chest wall movement, small thick secretions Ext +muscle wasting Moves L, not moving R Tracks toward L L gaze preference  Assessment & Plan:  Acute right parietotemporal intraparenchymal hemorrhage with IVH with ICH score 2 due to uncontrolled hypertension Hypertensive emergency Obstructive hydrocephalus status post EVD Acute left parietal ischemic stroke was ruled out Acute encephalopathy in the setting of ICH and hydrocephalus Acute respiratory insufficiency Serratia/klebsiella pneumonia End-stage renal disease on hemodialysis with failed renal transplant but functional pancreatic transplant on immunosuppressive therapy Hypervolemic hyponatremia, hypochloremia  Type 1 diabetes, complicated with vasculopathy and gastroparesis- not on long term  insulin  anymore Narcotic abuse Moderate protein calorie malnutrition Anemia, thrombocytopenia-  am labs pending Stage 1 pressure ulcer POA  - EVD per neurosurgery - Continue home Vimpat  - Continue Tylenol  as needed fever -For HD today, will hold hydralazine  and clonidine  before HD for hypotension post HD -Brittle hemodynamics severe hypertension, with severe hypotension post HD - Otherwise continue coreg  50BID, , hydralazine  100 q8hrs, clonidine  0.3 TID,amlodipine  10 daily.  Will DC minoxidil  -Continue tube feeds Nepro at goal, TF per PEG -SSI - Abx to complete 9/24 - Anti-rejection meds for pancreatic transplant as ordered -Will DC left subclavian central line - Started on subcu heparin  5 BID - No Foley, patient is anuric, but will do bladder scan to rule out retention.   Lenny Drought, MD CCM team   My critical care time: 40 minutes  Critical care time was exclusive of separately billable procedures and treating other patients.  Critical care was necessary to treat or prevent imminent or life-threatening deterioration.  Critical care was time spent personally by me on the following activities: development of treatment plan with patient and/or surrogate as well as nursing, discussions with consultants, evaluation of patient's response to treatment, examination of patient, obtaining history from patient or surrogate, ordering and performing treatments and interventions, ordering and review of laboratory studies, ordering and review of radiographic studies, pulse oximetry, re-evaluation of patient's condition and participation in multidisciplinary rounds.

## 2024-05-28 NOTE — TOC Progression Note (Signed)
 Transition of Care [facility]) - Progression Note    Patient Details  Name: [name] [name] MRN: [id number] Date of Birth: 12-24-1980  Transition of Care [facility]) CM/SW Contact  Inocente GORMAN Kindle, LCSW Phone Number: 05/28/2024, 10:19 AM  Clinical Narrative:    CSW continuing to follow. EVD in place.    Expected Discharge Plan: Skilled Nursing Facility Barriers to Discharge: Continued Medical Work up, English as a second language teacher               Expected Discharge Plan and Services In-house Referral: Clinical Social Work   Post Acute Care Choice: Skilled Nursing Facility Living arrangements for the past 2 months: Apartment                                       Social Drivers of Health (SDOH) Interventions SDOH Screenings   Food Insecurity: Patient Unable To Answer (05/20/2024)  Housing: Patient Unable To Answer (05/20/2024)  Transportation Needs: Patient Unable To Answer (05/20/2024)  Utilities: Patient Unable To Answer (05/20/2024)  Social Connections: Unknown (10/04/2022)   Received from [facility]  Stress: No Stress Concern Present (04/16/2024)   Received from [facility]  Tobacco Use: Medium Risk (04/27/2024)    Readmission Risk Interventions    05/20/2024    4:16 PM 03/31/2024   11:27 AM 11/17/2021   12:15 PM  Readmission Risk Prevention Plan  Transportation Screening Complete Complete Complete  Medication Review Oceanographer) Complete Complete Complete  PCP or Specialist appointment within 3-5 days of discharge Complete  Complete  HRI or Home Care Consult Complete Complete Complete  SW Recovery Care/Counseling Consult Complete Complete Complete  Palliative Care Screening Complete Not Applicable Not Applicable  Skilled Nursing Facility Complete Not Applicable Patient Refused

## 2024-05-28 NOTE — Progress Notes (Signed)
 Patient ID: [name] [name], female   DOB: 07-04-1981, 43 y.o.   MRN: [id number] [facility] Progress Note   Assessment/ Plan:   1.  Acute right intracranial hemorrhage: Associated with uncontrolled hypertension and status post ventriculostomy.  Extubated 9/22 and plans noted by neurosurgery for possible elevation of patient's ventriculostomy/removal later in the week. 2. ESRD following failure of renal allograft: Continue hemodialysis on Monday/Wednesday/Friday schedule with next dialysis ordered for today via right BCF.  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 3. Anemia: Hemoglobin/hematocrit slightly improved with ongoing overt mild loss from AVP.  Due for ESA on Wednesday. 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 Nutrition: Ongoing tube feeds, monitor electrolytes/volume status. 6. Hypertension: Blood pressure currently well-controlled multiple agents including amlodipine , carvedilol  and clonidine .  Subjective:   Without acute event/clinical concerns overnight.  Remains stable on oxygen  via nasal cannula   Objective:   BP (!) 111/54   Pulse 91   Temp 98.4 F (36.9 C) (Axillary)   Resp (!) 27   Ht 5' 3 (1.6 m)   Wt 49.8 kg   SpO2 100%   BMI 19.45 kg/m   Physical Exam: Gen: Unresponsive, on oxygen  via nasal cannula.  Ventriculostomy drain serosanguineous.  Mother at bedside CVS: Pulse regular rhythm, normal rate, S1 and S2 normal Resp: Anteriorly clear to auscultation, no rales/rhonchi Abd: Soft, flat, nontender, bowel sounds normal Ext: No lower extremity edema.  Right upper arm AV fistula with palpable thrill  Labs: BMET Recent Labs  Lab 05/22/24 0500 05/23/24 0445 05/25/24 0341 05/26/24 0733 05/27/24 0452 05/27/24 1614 05/28/24 0454  NA 133* 133* 129* 127*  132* 128* 131*  K 3.8 4.3 5.9* 5.4* 3.6 4.3 4.0  CL 91* 91* 90* 88* 93*  --  88*  CO2 27 25 23 22 26   --  27  GLUCOSE 136* 116* 126* 130* 141*  --  143*  BUN 12 29* 60* 94* 38*  --  70*  CREATININE 2.23* 3.67* 3.89* 5.19* 2.57*  --  4.06*  CALCIUM  9.0 9.7 10.1 10.7* 9.8  --  10.7*  PHOS 3.2  --  6.0* 6.8* 3.4  --  6.0*   CBC Recent Labs  Lab 05/25/24 0341 05/26/24 0733 05/27/24 0452 05/27/24 1614 05/28/24 0454  WBC 3.4* 3.9* 4.6  --  4.6  NEUTROABS  --  2.5  --   --   --   HGB 8.3* 8.7* 8.3* 8.5* 8.7*  HCT 26.7* 27.7* 25.9* 25.0* 27.2*  MCV 97.4 97.2 95.6  --  95.8  PLT 113* 147* 159  --  169      Medications:     amLODipine   10 mg Per Tube Daily   carvedilol   50 mg Per Tube BID WC   Chlorhexidine  Gluconate Cloth  6 each Topical Q0600   cloNIDine   0.3 mg Per Tube TID   cycloSPORINE   200 mg Per Tube QPM   cycloSPORINE   225 mg Per Tube Daily   heparin  injection (subcutaneous)  5,000 Units Subcutaneous Q12H   hydrALAZINE   100 mg Per Tube Q8H   lacosamide   100 mg Per Tube BID   mycophenolate   500 mg Per Tube BID   mouth rinse  15 mL Mouth Rinse 4 times per day   pantoprazole  (PROTONIX ) IV  40 mg Intravenous QHS   polyethylene glycol  17 g  Per Tube Daily   predniSONE   15 mg Per Tube Q breakfast   Gordy Blanch, MD 05/28/2024, 9:57 AM

## 2024-05-28 NOTE — Progress Notes (Signed)
   05/28/24 1730  Vitals  Temp 97.9 F (36.6 C)  Pulse Rate 96  Resp (!) 28  BP (!) 108/50  SpO2 100 %  Post Treatment  Dialyzer Clearance Clear  Hemodialysis Intake (mL) 100 mL  Liters Processed 84  Fluid Removed (mL) 2000 mL  Tolerated HD Treatment Yes  AVG/AVF Arterial Site Held (minutes) 10 minutes  AVG/AVF Venous Site Held (minutes) 10 minutes   Received patient in bed to unit.  Alert and oriented.  Informed consent signed and in chart.   TX duration:3.5HRS  Patient tolerated well.  Transported back to the room  Alert, without acute distress.  Hand-off given to patient's nurse.   Access used: RAVF Access issues: NONE  Total UF removed: 2L Medication(s) given: ALBUMIN  25G    [name]

## 2024-05-28 NOTE — Progress Notes (Signed)
 Subjective: Patient has been extubated.  She is in no apparent distress.  Her mother is at the bedside.  Objective: Vital signs in last 24 hours: Temp:  [98.3 F (36.8 C)-101.6 F (38.7 C)] 98.3 F (36.8 C) (09/24 0400) Pulse Rate:  [89-107] 97 (09/24 0700) Resp:  [21-36] 25 (09/24 0700) BP: (91-152)/(49-69) 151/64 (09/24 0700) SpO2:  [97 %-100 %] 100 % (09/24 0700) Weight:  [49.8 kg] 49.8 kg (09/24 0500) Estimated body mass index is 19.45 kg/m as calculated from the following:   Height as of this encounter: 5' 3 (1.6 m).   Weight as of this encounter: 49.8 kg.   Intake/Output from previous day: 09/23 0701 - 09/24 0700 In: 1027.6 [NG/GT:927.5; IV Piggyback:100.1] Out: 232 [Drains:232] Intake/Output this shift: No intake/output data recorded.  Physical exam the patient will open her eyes to voice.  She does not quite follow commands.  Her ventriculostomy is patent and draining.  Lab Results: Recent Labs    05/27/24 0452 05/27/24 1614 05/28/24 0454  WBC 4.6  --  4.6  HGB 8.3* 8.5* 8.7*  HCT 25.9* 25.0* 27.2*  PLT 159  --  169   BMET Recent Labs    05/27/24 0452 05/27/24 1614 05/28/24 0454  NA 132* 128* 131*  K 3.6 4.3 4.0  CL 93*  --  88*  CO2 26  --  27  GLUCOSE 141*  --  143*  BUN 38*  --  70*  CREATININE 2.57*  --  4.06*  CALCIUM  9.8  --  10.7*    Studies/Results: DG Chest Port 1 View Result Date: 05/26/2024 EXAM: 1 VIEW XRAY OF THE CHEST 05/26/2024 08:44:00 AM COMPARISON: 05/20/2024 CLINICAL HISTORY: SOB (shortness of breath) 141880. Reason for exam: SOB FINDINGS: LINES, TUBES AND DEVICES: Endotracheal tube in place with tip 2.6 cm above carina. Stable left subclavian line with tip in the projection of the superior cavoatrial junction. Enteric tube removed. LUNGS AND PLEURA: No focal pulmonary opacity. No pulmonary edema. No pleural effusion. No pneumothorax. HEART AND MEDIASTINUM: Cardiomegaly. No acute abnormality of the mediastinal silhouette. BONES  AND SOFT TISSUES: No acute osseous abnormality. IMPRESSION: 1. No acute cardiopulmonary process. 2. Endotracheal tube with tip 2.6 cm above the carina. 3. Stable left subclavian central venous catheter with tip at the superior cavoatrial junction. 4. Enteric tube removed. Electronically signed by: Waddell Calk MD 05/26/2024 09:02 AM EDT RP Workstation: [id number]    Assessment/Plan: Cerebral hemorrhage, hydrocephalus, ventriculostomy: I will plan to elevate the patient's ventriculostomy tomorrow and repeat her CT on Friday.  Perhaps we can remove the ventriculostomy on Friday.  I have answered the patient's mother's questions.  LOS: 9 days     [name] [name] 05/28/2024, 8:01 AM     Patient ID: [name] [name], female   DOB: 1981/07/16, 43 y.o.   MRN: [id number]

## 2024-05-29 ENCOUNTER — Ambulatory Visit (HOSPITAL_COMMUNITY): Admit: 2024-05-29 | Admitting: Vascular Surgery

## 2024-05-29 ENCOUNTER — Encounter (HOSPITAL_COMMUNITY): Payer: Self-pay

## 2024-05-29 DIAGNOSIS — I610 Nontraumatic intracerebral hemorrhage in hemisphere, subcortical: Secondary | ICD-10-CM | POA: Diagnosis not present

## 2024-05-29 DIAGNOSIS — I161 Hypertensive emergency: Secondary | ICD-10-CM | POA: Diagnosis not present

## 2024-05-29 DIAGNOSIS — J9600 Acute respiratory failure, unspecified whether with hypoxia or hypercapnia: Secondary | ICD-10-CM | POA: Diagnosis not present

## 2024-05-29 DIAGNOSIS — N186 End stage renal disease: Secondary | ICD-10-CM | POA: Diagnosis not present

## 2024-05-29 LAB — BASIC METABOLIC PANEL WITH GFR
Anion gap: 15 (ref 5–15)
BUN: 40 mg/dL — ABNORMAL HIGH (ref 6–20)
CO2: 26 mmol/L (ref 22–32)
Calcium: 11 mg/dL — ABNORMAL HIGH (ref 8.9–10.3)
Chloride: 90 mmol/L — ABNORMAL LOW (ref 98–111)
Creatinine, Ser: 2.7 mg/dL — ABNORMAL HIGH (ref 0.44–1.00)
GFR, Estimated: 22 mL/min — ABNORMAL LOW (ref >60)
Glucose, Bld: 143 mg/dL — ABNORMAL HIGH (ref 70–99)
Potassium: 3.7 mmol/L (ref 3.5–5.1)
Sodium: 131 mmol/L — ABNORMAL LOW (ref 135–145)

## 2024-05-29 LAB — CBC
HCT: 28.5 % — ABNORMAL LOW (ref 36.0–46.0)
Hemoglobin: 9.2 g/dL — ABNORMAL LOW (ref 12.0–15.0)
MCH: 30.5 pg (ref 26.0–34.0)
MCHC: 32.3 g/dL (ref 30.0–36.0)
MCV: 94.4 fL (ref 80.0–100.0)
Platelets: 165 K/uL (ref 150–400)
RBC: 3.02 MIL/uL — ABNORMAL LOW (ref 3.87–5.11)
RDW: 12.9 % (ref 11.5–15.5)
WBC: 3.9 K/uL — ABNORMAL LOW (ref 4.0–10.5)
nRBC: 0 % (ref 0.0–0.2)

## 2024-05-29 LAB — GLUCOSE, CAPILLARY
Glucose-Capillary: 133 mg/dL — ABNORMAL HIGH (ref 70–99)
Glucose-Capillary: 160 mg/dL — ABNORMAL HIGH (ref 70–99)
Glucose-Capillary: 185 mg/dL — ABNORMAL HIGH (ref 70–99)
Glucose-Capillary: 171 mg/dL — ABNORMAL HIGH (ref 70–99)
Glucose-Capillary: 160 mg/dL — ABNORMAL HIGH (ref 70–99)

## 2024-05-29 LAB — PHOSPHORUS: Phosphorus: 4.1 mg/dL (ref 2.5–4.6)

## 2024-05-29 LAB — MAGNESIUM: Magnesium: 2.5 mg/dL — ABNORMAL HIGH (ref 1.7–2.4)

## 2024-05-29 SURGERY — A/V SHUNT INTERVENTION
Anesthesia: LOCAL | Site: Arm Upper | Laterality: Right

## 2024-05-29 MED ORDER — HYDRALAZINE HCL 50 MG PO TABS: 100.0000 mg | ORAL_TABLET | ORAL | Status: DC

## 2024-05-29 MED ORDER — CLONIDINE HCL 0.2 MG PO TABS
0.3000 mg | ORAL_TABLET | ORAL | Status: DC
  Administered 2024-05-29 – 2024-05-31 (×4): 0.3 mg
  Filled 2024-05-29 (×6): qty 1

## 2024-05-29 MED ORDER — CARVEDILOL 12.5 MG PO TABS
50.0000 mg | ORAL_TABLET | ORAL | Status: DC
  Administered 2024-05-29 – 2024-05-31 (×2): 50 mg
  Filled 2024-05-29 (×3): qty 4

## 2024-05-29 MED ORDER — OSMOLITE 1.5 CAL PO LIQD
1000.0000 mL | ORAL | Status: DC
  Administered 2024-05-29 – 2024-06-04 (×6): 1000 mL
  Filled 2024-05-29: qty 1000

## 2024-05-29 MED ORDER — PROSOURCE TF20 ENFIT COMPATIBL EN LIQD
60.0000 mL | Freq: Every day | ENTERAL | Status: DC
  Administered 2024-05-29 – 2024-06-06 (×9): 60 mL
  Filled 2024-05-29 (×9): qty 60

## 2024-05-29 MED ORDER — AMLODIPINE BESYLATE 10 MG PO TABS
10.0000 mg | ORAL_TABLET | ORAL | Status: DC
  Administered 2024-05-29 – 2024-06-17 (×12): 10 mg
  Filled 2024-05-29 (×12): qty 1

## 2024-05-29 MED ORDER — HYDRALAZINE HCL 50 MG PO TABS
75.0000 mg | ORAL_TABLET | ORAL | Status: DC
Start: 2024-05-29 — End: 2024-05-31
  Administered 2024-05-29 – 2024-05-31 (×3): 75 mg
  Filled 2024-05-29 (×3): qty 1

## 2024-05-29 NOTE — Progress Notes (Addendum)
 NAME:  [name] [name], MRN:  [id number], DOB:  10/14/1980, LOS: 10 ADMISSION DATE:  05/19/2024 CONSULTATION DATE:  9/15/20025 REFERRING MD: Patsey - EDP, CHIEF COMPLAINT:  Code Stroke, ICH   History of Present Illness:  43 year old woman who presented to [facility] ED 9/15 as a Code Stroke. 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 Recent admissions 6/11-7/28 and 7/29-8/13 for ICH.  Patient presented to [facility] ED from dialysis as a Code Stroke; reportedly LKW 1450 with development of obtundation and hemiparesis. SBP 200s. NIHSS 31 with initial leftward gaze, decreased LOC/disorientation, bilateral facial droop/arm weakness/leg weakness, global aphasia, dysarthria, visual neglect. CT Head demonstrated large volume IVH with moderate hydrocephalus, acute hemorrhage of medial R temporal lobe and small foci of residual/recurrent hemorrhage in the L cerebellar hemisphere.   Patient was seen by stroke team and neurosurgery, EVD was placed on left side, PCCM was consulted for medical evaluation and admission to neuro ICU  Pertinent Medical History:   Past Medical History:  Diagnosis Date   Anemia    Anxiety    DM (diabetes mellitus), type 1 (HCC)    ESRD on hemodialysis (HCC)    Gastroparesis    History of simultaneous kidney and pancreas transplant (HCC) 2014   Hypertension    Ischemic cerebrovascular accident (CVA) (HCC) 11/2021   Narcotic abuse (HCC)    Non compliance with medical treatment    Stroke [facility])    Vision loss    Significant Hospital Events: Including procedures, antibiotic start and stop dates in addition to other pertinent events   9/15 admitted with ICH plus IVH, EVD was placed for hydrocephalus 9/16 remain on clevidipine  infusion, tolerating spontaneous breathing trial, EVD is draining bloody fluid 9/17 continue to  require clevidipine  infusion, tolerated spontaneous breathing trial 9/18: No overnight issues. Currently off sedation. Still on high-dose `clevidipine  infusion 9/19: this am, dialysis ongoing. Neurosx plans to continue ventriculostomy for now. Remains on cleviprex  to maintain BP goal. Tmax 100.1. despite plt decrease 4T score 0. Will maintain heparin  as is for now and closely follow trend.   Interim History / Subjective:  HD session: 9/24-2.5 L removed, hypotensive post HD. EVD elevated this a.m. to 30, on repeat CT scheduled for 9/26-possible removal Otherwise open eyes to voice, does not follow commands   Objective:  Blood pressure (!) 149/62, pulse 100, temperature 99.7 F (37.6 C), temperature source Axillary, resp. rate (!) 27, height 5' 3 (1.6 m), weight 47.9 kg, SpO2 100%.        Intake/Output Summary (Last 24 hours) at 05/29/2024 9277 Last data filed at 05/29/2024 0700 Gross per 24 hour  Intake 1065 ml  Output 2588 ml  Net -1523 ml   Filed Weights   05/28/24 0500 05/28/24 1330 05/29/24 0500  Weight: 49.8 kg 49.8 kg 47.9 kg   Physical Examination: Chronically ill appearing Aox0, EVD-10, minimal output, opens eye, respond pain weaker left side EVD-minimal output Symmetrical chest wall movement, small thick secretions Ext +muscle wasting Moves L, not moving R Tracks toward L L gaze preference  Assessment & Plan:  Acute right parietotemporal intraparenchymal hemorrhage with IVH with ICH score 2 due to uncontrolled hypertension Hypertensive emergency Obstructive hydrocephalus status post EVD Acute left parietal ischemic stroke was ruled out Acute encephalopathy in the setting of ICH and hydrocephalus Acute respiratory insufficiency Serratia/klebsiella pneumonia End-stage renal disease on hemodialysis with failed renal transplant but  functional pancreatic transplant on immunosuppressive therapy Hypervolemic hyponatremia, hypochloremia  Type 1 diabetes, complicated with  vasculopathy and gastroparesis- not on long term insulin  anymore Narcotic abuse Moderate protein calorie malnutrition Anemia, thrombocytopenia- am labs pending Stage 1 pressure ulcer POA  - EVD per neurosurgery -EVD elevated on 9/25 to 30, repeat CT scheduled for 9/26 - Continue home Vimpat  - Continue Tylenol  as needed fever -Hold HTN meds on HD days  -Brittle hemodynamics severe hypertension, with severe hypotension post HD - Otherwise continue coreg  50BID, , hydralazine  100 q8hrs, clonidine  0.3 TID,amlodipine  10 daily.  -Continue tube feeds Nepro at goal, TF per PEG -SSI - Abx to complete 9/24 - Anti-rejection meds for pancreatic transplant as ordered - on subcu heparin  5 BID - No Foley, patient is anuric, but will do bladder scan to rule out retention.   Lenny Drought, MD CCM team    My critical care time: 30 minutes  Critical care time was exclusive of separately billable procedures and treating other patients.  Critical care was necessary to treat or prevent imminent or life-threatening deterioration.  Critical care was time spent personally by me on the following activities: development of treatment plan with patient and/or surrogate as well as nursing, discussions with consultants, evaluation of patient's response to treatment, examination of patient, obtaining history from patient or surrogate, ordering and performing treatments and interventions, ordering and review of laboratory studies, ordering and review of radiographic studies, pulse oximetry, re-evaluation of patient's condition and participation in multidisciplinary rounds.

## 2024-05-29 NOTE — Progress Notes (Signed)
 Was contacted by NP bergman and given orders to place drain back at 30 CM H2O.

## 2024-05-29 NOTE — Progress Notes (Signed)
 Nutrition Follow-up  DOCUMENTATION CODES:   Underweight, Non-severe (moderate) malnutrition in context of chronic illness  INTERVENTION:   D/C Nepro  Tube feeding via J-tube: Osmolite 1.5 at 50 ml/h (1200 ml per day)  Prosource TF20 60 ml daily  Provides 1880 kcal, 95 gm protein, 912 ml free water  daily  Continue liquid MVI  NUTRITION DIAGNOSIS:   Moderate Malnutrition related to chronic illness (ESRD on HD/DM and gastroparesis) as evidenced by severe muscle depletion, mild fat depletion. Ongoing.   GOAL:   Patient will meet greater than or equal to 90% of their needs Progressing.   MONITOR:   Diet advancement, Vent status, Labs, I & O's, TF tolerance  REASON FOR ASSESSMENT:   Consult Enteral/tube feeding initiation and management  ASSESSMENT:   43 year old female who presented as a Code Stroke with IVH. 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 Recent admissions 6/11-7/28 and 7/29-8/13 for ICH with GJ tube.   Per neuro pt more awake, not following commands. Sleeping soundly and did not wake to voice during visit. Spoke with mom who is now visiting with daughter from [place].  Admit to [facility] 7/29 with weight of 55.3 kg. During admission pt was on Nepro @ 40 ml/hr. Weight documented 8/12 in [facility] was 60.6 kg. Pt then discharged and went to [facility] 8/13. Per mom's report feeding tube was clogged multiple times during that stay.  Pt experienced hypotension during HD session 9/24 requiring infusion of Albumin .  No UOP noted but is having liquid BMs with FMS.   6/11 - 7/29: Hospital admission, discharge to [facility] 8/12 - G tube converted to GJ  9/16 - Admitted, EVD placed, intubated 9/21 - switched to Nepro formula; FMS placed 9/23 - adjusted rate of Nepro to meet needs (was previously exceeding)  Medications  reviewed and include:  Aranesp , cellcept , protonix , miralax , prednisone  Nepro @ 35 ml/hr   Labs reviewed:  Na 131 K 3.7 BUN 40 Cr 2.70 Ca 11.0 - Corrected Calcium  12.4 Phos 4.1 Mag 2.5 Albumin  2.3 A1C 3.6 CBG's: 133-185   UF: 2000 ml 9/24 18 F G-J tube  Diet Order:   Diet Order             Diet NPO time specified  Diet effective now                   EDUCATION NEEDS:   Not appropriate for education at this time  Skin:  Skin Assessment: Skin Integrity Issues: Skin Integrity Issues:: Stage I Stage I: buttocks  Last BM:  375 ml via FMS  Height:   Ht Readings from Last 1 Encounters:  05/21/24 5' 3 (1.6 m)    Weight:   Wt Readings from Last 1 Encounters:  05/29/24 47.9 kg    BMI:  Body mass index is 18.71 kg/m.  Estimated Nutritional Needs:   Kcal:  1700-1900  Protein:  80-100 grams  Fluid:  1L +UOP  Gloriann Riede P., RD, LDN, CNSC See AMiON for contact information

## 2024-05-29 NOTE — Progress Notes (Signed)
 Paged Neurosurgery on call about paitient's respiratory status and elevated SBP above 170. Earlier, the EVD drain was raised to 30 of H2O, patient experienced tachypnea and tachycardia, drain was lowered back and symptoms resolved. EVD drain was then raised back up again to 30 H2O per Mavis, and to be left unless pt experiences a neuro change. Upon arriving for the night shift, patient's respiratory rate was back up in the 40s, patient was tachycardic and hypertensive. Neurosurgery paged and told about today's events. Verbal order to lower EVD drain back down to 10 H2O. Drain dumped 22 ml within first hour of being lowered back down, previous hours output was 0. Respiratory rate back down to high 20s, lower 30s. Labatalol PRN given for SBP and heart rate in 90s to 100. SBP <160 after the interventions.

## 2024-05-29 NOTE — Progress Notes (Signed)
 Alerted NP Jennetta and NP Adolph that patient was a bit more labored with breathing and experiencing tachypnea.  Orders were given to lower drain back to 10 cm H20.  RN will continue to assess and act accordingly.

## 2024-05-29 NOTE — Progress Notes (Signed)
 SLP Cancellation Note  Patient Details Name: [name] [name] MRN: [id number] DOB: [birth year]   Cancelled treatment:       Reason Eval/Treat Not Completed: Patient's level of consciousness Patient asleep and per RN has been more lethargic today as compared to yesterday after HD. SLP spoke briefly with patient's mother who was in the room. She reported that patient has spoke with her in Arabic today but only a few words. She is hopeful for patient's swallow function to improve. SLP will follow for readiness.  Norleen IVAR Blase, MA, CCC-SLP Speech Therapy

## 2024-05-29 NOTE — Progress Notes (Signed)
 Patient ID: [name] [name], female   DOB: 1981-05-29, 43 y.o.   MRN: [id number] [facility] Progress Note   Assessment/ Plan:   1.  Acute right intracranial hemorrhage: Associated with uncontrolled hypertension and status post ventriculostomy.  Extubated 9/22 and plans noted by neurosurgery for possible elevation of patient's ventriculostomy/removal later in the week. 2. ESRD following failure of renal allograft: Continue hemodialysis on Monday/Wednesday/Friday schedule and will order for next hemodialysis tomorrow via right BCF.  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 3. Anemia: Hemoglobin/hematocrit slightly improved with ongoing overt mild loss from AVP.  Due for ESA on Wednesday. 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 Nutrition: Ongoing tube feeds, monitor electrolytes/volume status. 6. Hypertension: Blood pressure low at this time, would begin peeling back on antihypertensive dosing starting with hydralazine  down to 75 mg 3 times a day.  Continue to maintain hold parameters per ICU protocol.  Subjective:   Intradialytic hypotension noted yesterday prompting albumin  bolus infusion   Objective:   BP (!) 114/57   Pulse 94   Temp 98.6 F (37 C) (Axillary)   Resp (!) 27   Ht 5' 3 (1.6 m)   Wt 47.9 kg   SpO2 100%   BMI 18.71 kg/m   Physical Exam: Gen: Unresponsive, on oxygen  via nasal cannula.  Ventriculostomy drain serosanguineous.  Mother at bedside CVS: Pulse regular rhythm, normal rate, S1 and S2 normal Resp: Anteriorly clear to auscultation, no rales/rhonchi Abd: Soft, flat, nontender, bowel sounds normal Ext: No lower extremity edema.  Right upper arm AV fistula with palpable thrill/intact dressings  Labs: BMET Recent Labs  Lab 05/23/24 0445 05/25/24 0341  05/26/24 0733 05/27/24 0452 05/27/24 1614 05/28/24 0454 05/29/24 0648  NA 133* 129* 127* 132* 128* 131* 131*  K 4.3 5.9* 5.4* 3.6 4.3 4.0 3.7  CL 91* 90* 88* 93*  --  88* 90*  CO2 25 23 22 26   --  27 26  GLUCOSE 116* 126* 130* 141*  --  143* 143*  BUN 29* 60* 94* 38*  --  70* 40*  CREATININE 3.67* 3.89* 5.19* 2.57*  --  4.06* 2.70*  CALCIUM  9.7 10.1 10.7* 9.8  --  10.7* 11.0*  PHOS  --  6.0* 6.8* 3.4  --  6.0* 4.1   CBC Recent Labs  Lab 05/26/24 0733 05/27/24 0452 05/27/24 1614 05/28/24 0454 05/29/24 0648  WBC 3.9* 4.6  --  4.6 3.9*  NEUTROABS 2.5  --   --   --   --   HGB 8.7* 8.3* 8.5* 8.7* 9.2*  HCT 27.7* 25.9* 25.0* 27.2* 28.5*  MCV 97.2 95.6  --  95.8 94.4  PLT 147* 159  --  169 165      Medications:     amLODipine   10 mg Per Tube Once per day on Sunday Tuesday Thursday Saturday   carvedilol   50 mg Per Tube 2 times per day on Sunday Tuesday Thursday Saturday   Chlorhexidine  Gluconate Cloth  6 each Topical Q0600   cloNIDine   0.3 mg Per Tube 3 times per day on Sunday Tuesday Thursday Saturday   cycloSPORINE   200 mg Per Tube QPM   cycloSPORINE   225 mg Per Tube Daily   darbepoetin (ARANESP ) injection - DIALYSIS  100 mcg Subcutaneous Q Wed-1800   heparin  injection (subcutaneous)  5,000 Units Subcutaneous Q12H   hydrALAZINE   100 mg Per Tube 3 times per day on Sunday Tuesday Thursday Saturday   lacosamide   100 mg Per Tube BID   mycophenolate   500 mg Per Tube BID   mouth rinse  15 mL Mouth Rinse 4 times per day   pantoprazole  (PROTONIX ) IV  40 mg Intravenous QHS   polyethylene glycol  17 g Per Tube Daily   predniSONE   15 mg Per Tube Q breakfast   Gordy Blanch, MD 05/29/2024, 9:27 AM

## 2024-05-29 NOTE — Progress Notes (Signed)
 Spoke to CCM about the major changes that were made with patient's blood pressure meds today. All meds were switched from multiple times a day everyday to multiple times a day ONLY on days patient not receiving dialysis. Because of this switch, patient did not receive some doses of HTN meds today. Will receive doses with bedtime, and still have PRN HTN meds if needed. Plan tonight per eLink, see how patient does after bedtime doses of HTN meds. If patient needs PRN medications, okay to give as needed. This RN to report to day team how many doses of PRN medications patient needed over the night. (Previously patient hasn't needed any PRN meds for HTN until this evening)

## 2024-05-29 NOTE — Progress Notes (Signed)
 Subjective: The patient is alert/attentive.  Her mother, who is at the bedside, said she spoke a bit and has intermittently follow commands.  Objective: Vital signs in last 24 hours: Temp:  [97.9 F (36.6 C)-99.7 F (37.6 C)] 98.6 F (37 C) (09/25 0800) Pulse Rate:  [91-108] 100 (09/25 0700) Resp:  [23-33] 27 (09/25 0700) BP: (80-149)/(45-63) 149/62 (09/25 0700) SpO2:  [98 %-100 %] 100 % (09/25 0700) Weight:  [47.9 kg-49.8 kg] 47.9 kg (09/25 0500) Estimated body mass index is 18.71 kg/m as calculated from the following:   Height as of this encounter: 5' 3 (1.6 m).   Weight as of this encounter: 47.9 kg.   Intake/Output from previous day: 09/24 0701 - 09/25 0700 In: 1065 [NG/GT:840; IV Piggyback:225] Out: 2588 [Drains:213; Stool:375] Intake/Output this shift: No intake/output data recorded.  Physical exam the patient's eyes are open spontaneously.  She tracks.  She does not quite follow commands.  She moves her right extremities more than the left.  Her ventriculostomy is patent and seems to be at relatively low pressure.  Lab Results: Recent Labs    05/28/24 0454 05/29/24 0648  WBC 4.6 3.9*  HGB 8.7* 9.2*  HCT 27.2* 28.5*  PLT 169 165   BMET Recent Labs    05/28/24 0454 05/29/24 0648  NA 131* 131*  K 4.0 3.7  CL 88* 90*  CO2 27 26  GLUCOSE 143* 143*  BUN 70* 40*  CREATININE 4.06* 2.70*  CALCIUM  10.7* 11.0*    Studies/Results: No results found.  Assessment/Plan: Intracerebral hemorrhage, intraventricular hemorrhage, hydrocephalus, colostomy: We will elevate the patient's ventriculostomy to 30 cm of water .  I will plan to repeat her CAT scan tomorrow.  Hopefully we can remove the ventriculostomy tomorrow.  LOS: 10 days     [name] [name] 05/29/2024, 8:25 AM     Patient ID: [name] [name], female   DOB: 01/21/1981, 43 y.o.   MRN: [id number]

## 2024-05-30 ENCOUNTER — Inpatient Hospital Stay (HOSPITAL_COMMUNITY)

## 2024-05-30 DIAGNOSIS — Z992 Dependence on renal dialysis: Secondary | ICD-10-CM | POA: Diagnosis not present

## 2024-05-30 DIAGNOSIS — I610 Nontraumatic intracerebral hemorrhage in hemisphere, subcortical: Secondary | ICD-10-CM | POA: Diagnosis not present

## 2024-05-30 DIAGNOSIS — N186 End stage renal disease: Secondary | ICD-10-CM | POA: Diagnosis not present

## 2024-05-30 DIAGNOSIS — I161 Hypertensive emergency: Secondary | ICD-10-CM | POA: Diagnosis not present

## 2024-05-30 DIAGNOSIS — E1069 Type 1 diabetes mellitus with other specified complication: Secondary | ICD-10-CM

## 2024-05-30 LAB — CBC
HCT: 27.7 % — ABNORMAL LOW (ref 36.0–46.0)
Hemoglobin: 8.9 g/dL — ABNORMAL LOW (ref 12.0–15.0)
MCH: 30.3 pg (ref 26.0–34.0)
MCHC: 32.1 g/dL (ref 30.0–36.0)
MCV: 94.2 fL (ref 80.0–100.0)
Platelets: 195 K/uL (ref 150–400)
RBC: 2.94 MIL/uL — ABNORMAL LOW (ref 3.87–5.11)
RDW: 12.9 % (ref 11.5–15.5)
WBC: 4.1 K/uL (ref 4.0–10.5)
nRBC: 0 % (ref 0.0–0.2)

## 2024-05-30 LAB — GLUCOSE, CAPILLARY
Glucose-Capillary: 144 mg/dL — ABNORMAL HIGH (ref 70–99)
Glucose-Capillary: 154 mg/dL — ABNORMAL HIGH (ref 70–99)
Glucose-Capillary: 163 mg/dL — ABNORMAL HIGH (ref 70–99)
Glucose-Capillary: 165 mg/dL — ABNORMAL HIGH (ref 70–99)
Glucose-Capillary: 167 mg/dL — ABNORMAL HIGH (ref 70–99)
Glucose-Capillary: 169 mg/dL — ABNORMAL HIGH (ref 70–99)
Glucose-Capillary: 191 mg/dL — ABNORMAL HIGH (ref 70–99)

## 2024-05-30 LAB — BASIC METABOLIC PANEL WITH GFR
Anion gap: 16 — ABNORMAL HIGH (ref 5–15)
BUN: 70 mg/dL — ABNORMAL HIGH (ref 6–20)
CO2: 24 mmol/L (ref 22–32)
Calcium: 10.6 mg/dL — ABNORMAL HIGH (ref 8.9–10.3)
Chloride: 90 mmol/L — ABNORMAL LOW (ref 98–111)
Creatinine, Ser: 3.93 mg/dL — ABNORMAL HIGH (ref 0.44–1.00)
GFR, Estimated: 14 mL/min — ABNORMAL LOW (ref >60)
Glucose, Bld: 178 mg/dL — ABNORMAL HIGH (ref 70–99)
Potassium: 4.7 mmol/L (ref 3.5–5.1)
Sodium: 130 mmol/L — ABNORMAL LOW (ref 135–145)

## 2024-05-30 LAB — MAGNESIUM: Magnesium: 2.6 mg/dL — ABNORMAL HIGH (ref 1.7–2.4)

## 2024-05-30 LAB — PHOSPHORUS: Phosphorus: 4.5 mg/dL (ref 2.5–4.6)

## 2024-05-30 MED ORDER — CARVEDILOL 12.5 MG PO TABS
50.0000 mg | ORAL_TABLET | Freq: Once | ORAL | Status: AC
  Administered 2024-05-30: 50 mg via ORAL

## 2024-05-30 NOTE — Progress Notes (Signed)
 NAME:  [name] [name], MRN:  [id number], DOB:  07/23/1981, LOS: 11 ADMISSION DATE:  05/19/2024 CONSULTATION DATE:  9/15/20025 REFERRING MD: Patsey - EDP, CHIEF COMPLAINT:  Code Stroke, ICH   History of Present Illness:  43 year old woman who presented to [facility] ED 9/15 as a Code Stroke. 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 Recent admissions 6/11-7/28 and 7/29-8/13 for ICH.  Patient presented to [facility] ED from dialysis as a Code Stroke; reportedly LKW 1450 with development of obtundation and hemiparesis. SBP 200s. NIHSS 31 with initial leftward gaze, decreased LOC/disorientation, bilateral facial droop/arm weakness/leg weakness, global aphasia, dysarthria, visual neglect. CT Head demonstrated large volume IVH with moderate hydrocephalus, acute hemorrhage of medial R temporal lobe and small foci of residual/recurrent hemorrhage in the L cerebellar hemisphere.   Patient was seen by stroke team and neurosurgery, EVD was placed on left side, PCCM was consulted for medical evaluation and admission to neuro ICU  Pertinent Medical History:   Past Medical History:  Diagnosis Date   Anemia    Anxiety    DM (diabetes mellitus), type 1 (HCC)    ESRD on hemodialysis (HCC)    Gastroparesis    History of simultaneous kidney and pancreas transplant (HCC) 2014   Hypertension    Ischemic cerebrovascular accident (CVA) (HCC) 11/2021   Narcotic abuse (HCC)    Non compliance with medical treatment    Stroke [facility])    Vision loss    Significant Hospital Events: Including procedures, antibiotic start and stop dates in addition to other pertinent events   9/15 admitted with ICH plus IVH, EVD was placed for hydrocephalus 9/16 remain on clevidipine  infusion, tolerating spontaneous breathing trial, EVD is draining bloody fluid 9/17 continue to  require clevidipine  infusion, tolerated spontaneous breathing trial 9/18: No overnight issues. Currently off sedation. Still on high-dose `clevidipine  infusion 9/19: this am, dialysis ongoing. Neurosx plans to continue ventriculostomy for now. Remains on cleviprex  to maintain BP goal. Tmax 100.1. despite plt decrease 4T score 0. Will maintain heparin  as is for now and closely follow trend.   Interim History / Subjective:  HD session: 9/24-2.5 L removed, hypotensive post HD. EVD elevated this a.m. to 30, on repeat CT scheduled for 9/26-possible removal Otherwise open eyes to voice, does not follow commands   Objective:  Blood pressure (!) 160/66, pulse (!) 114, temperature 99.9 F (37.7 C), resp. rate (!) 26, height 5' 3 (1.6 m), weight 49.3 kg, SpO2 100%.        Intake/Output Summary (Last 24 hours) at 05/30/2024 0827 Last data filed at 05/30/2024 0700 Gross per 24 hour  Intake 1227.75 ml  Output 567 ml  Net 660.75 ml   Filed Weights   05/29/24 0500 05/30/24 0500 05/30/24 0730  Weight: 47.9 kg 49.3 kg 49.3 kg   Physical Examination: General: Ill-appearing, currently on dialysis, minimal responsiveness, opens eyes to stimulation Chest: Clear to auscultation bilaterally Heart: Regular rate and rhythm, S1, S2 Extremities: Noted muscle wasting, limited movement on the right Neuro: Left-sided gaze preference, minimal movement of the right    Assessment & Plan:  Acute right parietotemporal intraparenchymal hemorrhage with IVH with ICH score 2 due to uncontrolled hypertension Hypertensive emergency Obstructive hydrocephalus status post EVD Acute left parietal ischemic stroke was ruled out Acute encephalopathy in the setting of ICH and hydrocephalus Acute respiratory insufficiency Serratia/klebsiella pneumonia End-stage renal disease on hemodialysis with  failed renal transplant but functional pancreatic transplant on immunosuppressive therapy Hypervolemic hyponatremia,  hypochloremia  Type 1 diabetes, complicated with vasculopathy and gastroparesis- not on long term insulin  anymore Narcotic abuse Moderate protein calorie malnutrition Anemia, thrombocytopenia- am labs pending Stage 1 pressure ulcer POA  - EVD per neurosurgery -EVD elevated on 9/25 to 30, repeat CT today with interval evolution with marginal improvement of extensive intraventricular hemorrhage, greatest in the right lateral ventricle and nearly resolved intraparenchymal hemorrhage along the ventriculostomy Catheter (reassuring findings). Per NSG: till has quite a bit of intraventricular blood on the right and mild hydrocephalus. Possible HTN and tachycardia unrelated to EVD drain  - Continue home Vimpat  - Continue Tylenol  as needed fever -Hold HTN meds on HD days  -Brittle hemodynamics severe hypertension, with severe hypotension post HD - Otherwise continue coreg  50BID, , hydralazine  100 q8hrs, clonidine  0.3 TID,amlodipine  10 daily.  -Continue tube feeds Nepro at goal, TF per PEG -SSI - Abx to complete 9/24 - Anti-rejection meds for pancreatic transplant as ordered - on subcu heparin  5 BID - No Foley, patient is anuric, but will do bladder scan to rule out retention.    Zola LOISE Herter, MD [facility] 05/30/2024 8:30 AM

## 2024-05-30 NOTE — Progress Notes (Signed)
 Patient ID: [name] [name], female   DOB: [birth year], 43 y.o.   MRN: [id number] [facility] Progress Note   Assessment/ Plan:   1.  Acute right intracranial hemorrhage: Associated with uncontrolled hypertension and status post ventriculostomy.  Extubated 9/22 and plans noted by neurosurgery to continue current strategy for ventriculostomy drainage after limitations yesterday with weaning. 2. ESRD following failure of renal allograft: Continue hemodialysis on Monday/Wednesday/Friday schedule and currently getting/tolerating hemodialysis via right BCF.  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 3. Anemia: Hemoglobin/hematocrit slightly improved with ongoing overt mild loss from ventriculostomy.  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 Nutrition: Ongoing tube feeds, monitor electrolytes/volume status. 6. Hypertension: Blood pressure marginally elevated today, monitor with ongoing UF on HD.  Subjective:   Without acute events overnight, problems noted with tachypnea/hypotension with efforts at raising ventriculostomy.   Objective:   BP (!) 161/68   Pulse (!) 113   Temp 99.9 F (37.7 C)   Resp (!) 26   Ht 5' 3 (1.6 m)   Wt 49.3 kg   SpO2 100%   BMI 19.25 kg/m   Physical Exam: Gen: Unresponsive, on oxygen  via nasal cannula.  Ventriculostomy drain serosanguineous.  Mother at bedside CVS: Pulse regular rhythm, normal rate, S1 and S2 normal Resp: Anteriorly clear to auscultation, no rales/rhonchi Abd: Soft, flat, nontender, bowel sounds normal Ext: No lower extremity edema.  Right upper arm AV fistula cannulated for hemodialysis Labs: BMET Recent Labs  Lab 05/25/24 0341 05/26/24 0733 05/27/24 0452 05/27/24 1614 05/28/24 0454 05/29/24 0648 05/30/24 0207  NA 129* 127* 132*  128* 131* 131* 130*  K 5.9* 5.4* 3.6 4.3 4.0 3.7 4.7  CL 90* 88* 93*  --  88* 90* 90*  CO2 23 22 26   --  27 26 24   GLUCOSE 126* 130* 141*  --  143* 143* 178*  BUN 60* 94* 38*  --  70* 40* 70*  CREATININE 3.89* 5.19* 2.57*  --  4.06* 2.70* 3.93*  CALCIUM  10.1 10.7* 9.8  --  10.7* 11.0* 10.6*  PHOS 6.0* 6.8* 3.4  --  6.0* 4.1 4.5   CBC Recent Labs  Lab 05/26/24 0733 05/27/24 0452 05/27/24 1614 05/28/24 0454 05/29/24 0648 05/30/24 0207  WBC 3.9* 4.6  --  4.6 3.9* 4.1  NEUTROABS 2.5  --   --   --   --   --   HGB 8.7* 8.3* 8.5* 8.7* 9.2* 8.9*  HCT 27.7* 25.9* 25.0* 27.2* 28.5* 27.7*  MCV 97.2 95.6  --  95.8 94.4 94.2  PLT 147* 159  --  169 165 195      Medications:     amLODipine   10 mg Per Tube Once per day on Sunday Tuesday Thursday Saturday   carvedilol   50 mg Per Tube 2 times per day on Sunday Tuesday Thursday Saturday   Chlorhexidine  Gluconate Cloth  6 each Topical Q0600   cloNIDine   0.3 mg Per Tube 3 times per day on Sunday Tuesday Thursday Saturday   cycloSPORINE   200 mg Per Tube QPM   cycloSPORINE   225 mg Per Tube Daily   darbepoetin (ARANESP ) injection - DIALYSIS  100 mcg Subcutaneous Q Wed-1800   feeding supplement (PROSource TF20)  60 mL Per Tube Daily   heparin  injection (subcutaneous)  5,000 Units Subcutaneous Q12H   hydrALAZINE   75 mg Per Tube  3 times per day on Sunday Tuesday Thursday Saturday   lacosamide   100 mg Per Tube BID   mycophenolate   500 mg Per Tube BID   mouth rinse  15 mL Mouth Rinse 4 times per day   pantoprazole  (PROTONIX ) IV  40 mg Intravenous QHS   polyethylene glycol  17 g Per Tube Daily   predniSONE   15 mg Per Tube Q breakfast   Gordy Blanch, MD 05/30/2024, 8:55 AM

## 2024-05-30 NOTE — Procedures (Signed)
 Patient seen on Hemodialysis. BP (!) 161/68   Pulse (!) 113   Temp 99.9 F (37.7 C)   Resp (!) 26   Ht 5' 3 (1.6 m)   Wt 49.3 kg   SpO2 100%   BMI 19.25 kg/m   QB 400, UF goal 1.5L Tolerating treatment without complaints at this time.   Gordy Blanch MD [facility]. Office # [phone] Pager # [phone] 8:58 AM

## 2024-05-30 NOTE — Progress Notes (Signed)
 SLP Cancellation Note  Patient Details Name: [name] [name] MRN: [id number] DOB: 09/07/1980   Cancelled treatment:       Reason Eval/Treat Not Completed: Fatigue/lethargy limiting ability to participate  Pt currently having dialysis in room. Mother present and indicates now is not a good time for completion of cognitive-linguistic evaluation, due to lethargy and weakness. Will continue efforts.   Minnie Legros B. Dory, MSP, CCC-SLP Speech Language Pathologist Office: [phone]  Dory Caprice Daring 05/30/2024, 9:34 AM

## 2024-05-30 NOTE — TOC Progression Note (Signed)
 Transition of Care [facility]) - Progression Note    Patient Details  Name: [name] [name] MRN: [id number] Date of Birth: May 05, 1981  Transition of Care [facility]) CM/SW Contact  Inocente GORMAN Kindle, LCSW Phone Number: 05/30/2024, 9:47 AM  Clinical Narrative:    CSW continuing to follow. Patient with EVD.   Expected Discharge Plan: Skilled Nursing Facility Barriers to Discharge: Continued Medical Work up, English as a second language teacher               Expected Discharge Plan and Services In-house Referral: Clinical Social Work   Post Acute Care Choice: Skilled Nursing Facility Living arrangements for the past 2 months: Apartment                                       Social Drivers of Health (SDOH) Interventions SDOH Screenings   Food Insecurity: Patient Unable To Answer (05/20/2024)  Housing: Patient Unable To Answer (05/20/2024)  Transportation Needs: Patient Unable To Answer (05/20/2024)  Utilities: Patient Unable To Answer (05/20/2024)  Social Connections: Unknown (10/04/2022)   Received from [facility]  Stress: No Stress Concern Present (04/16/2024)   Received from [facility]  Tobacco Use: Medium Risk (04/27/2024)    Readmission Risk Interventions    05/20/2024    4:16 PM 03/31/2024   11:27 AM 11/17/2021   12:15 PM  Readmission Risk Prevention Plan  Transportation Screening Complete Complete Complete  Medication Review Oceanographer) Complete Complete Complete  PCP or Specialist appointment within 3-5 days of discharge Complete  Complete  HRI or Home Care Consult Complete Complete Complete  SW Recovery Care/Counseling Consult Complete Complete Complete  Palliative Care Screening Complete Not Applicable Not Applicable  Skilled Nursing Facility Complete Not Applicable Patient Refused

## 2024-05-30 NOTE — Progress Notes (Signed)
   05/30/24 1130  Vitals  Temp 99.3 F (37.4 C)  Pulse Rate (!) 115  Resp (!) 28  BP (!) 145/62  SpO2 100 %  Post Treatment  Dialyzer Clearance Clear  Hemodialysis Intake (mL) 0 mL  Liters Processed 78  Fluid Removed (mL) 1500 mL  Tolerated HD Treatment Yes  AVG/AVF Arterial Site Held (minutes) 10 minutes  AVG/AVF Venous Site Held (minutes) 10 minutes   Received patient in bed to unit.  Alert and oriented.  Informed consent signed and in chart.   TX duration:3.25hrs  Patient tolerated well.  Transported back to the room  Alert, without acute distress.  Hand-off given to patient's nurse.   Access used: RAVF Access issues: NONE  Total UF removed: 1.5L Medication(s) given: NONE    [name] [name] [facility]

## 2024-05-30 NOTE — Progress Notes (Signed)
 Subjective: The patient is without change.  Her mother is at the bedside.  By report the patient becomes tachypneic and hypertensive when her ventriculostomy is raised.  It was dropped down to 10 cm again.  Objective: Vital signs in last 24 hours: Temp:  [98.2 F (36.8 C)-101.2 F (38.4 C)] 98.2 F (36.8 C) (09/26 0400) Pulse Rate:  [92-108] 98 (09/26 0600) Resp:  [17-40] 19 (09/26 0600) BP: (93-176)/(52-81) 160/67 (09/26 0600) SpO2:  [100 %] 100 % (09/26 0600) Estimated body mass index is 18.71 kg/m as calculated from the following:   Height as of this encounter: 5' 3 (1.6 m).   Weight as of this encounter: 47.9 kg.   Intake/Output from previous day: 09/25 0701 - 09/26 0700 In: 1177.8 [NG/GT:1017.8; IV Piggyback:100] Out: 562 [Drains:162; Stool:400] Intake/Output this shift: Total I/O In: 610 [Other:60; NG/GT:550] Out: 127 [Drains:127]  Physical exam the patient will open her eyes.  She does not follow commands.  She fidgets.  Her ventriculostomy is patent.  I reviewed the patient's head CT performed this morning.  She is still has quite a bit of intraventricular blood on the right and mild hydrocephalus.  She had an old left stroke.  Lab Results: Recent Labs    05/29/24 0648 05/30/24 0207  WBC 3.9* 4.1  HGB 9.2* 8.9*  HCT 28.5* 27.7*  PLT 165 195   BMET Recent Labs    05/29/24 0648 05/30/24 0207  NA 131* 130*  K 3.7 4.7  CL 90* 90*  CO2 26 24  GLUCOSE 143* 178*  BUN 40* 70*  CREATININE 2.70* 3.93*  CALCIUM  11.0* 10.6*    Studies/Results: CT HEAD WO CONTRAST ( ) Result Date: 05/30/2024 EXAM: CT HEAD WITHOUT CONTRAST 05/30/2024 04:58:16 AM TECHNIQUE: CT of the head was performed without the administration of intravenous contrast. Automated exposure control, iterative reconstruction, and/or weight based adjustment of the mA/kV was utilized to reduce the radiation dose to as low as reasonably achievable. COMPARISON: CT of the head dated 05/22/2024.  CLINICAL HISTORY: Hydrocephalus. FINDINGS: BRAIN AND VENTRICLES: Interval evolution and marginal improvement of extensive interventricular hemorrhage, which remains more pronounced in the right lateral ventricle. Intraparenchymal hemorrhage along the ventriculostomy catheter has nearly resolved. The ventricles are stable in size. There are encephalomalacia changes again demonstrated within the left cerebellar hemisphere. No evidence of acute infarct. No extra-axial collection. No mass effect or midline shift. ORBITS: No acute abnormality. SINUSES: No acute abnormality. SOFT TISSUES AND SKULL: No acute soft tissue abnormality. No skull fracture. IMPRESSION: 1. Interval evolution with marginal improvement of extensive intraventricular hemorrhage, greatest in the right lateral ventricle. 2. Nearly resolved intraparenchymal hemorrhage along the ventriculostomy catheter. 3. Encephalomalacia in the left cerebellar hemisphere. Electronically signed by: Evalene Coho MD 05/30/2024 05:29 AM EDT RP Workstation: [id number]    Assessment/Plan: Intracerebral hemorrhage, intraventricular hemorrhage, hydrocephalus, ventriculostomy: We have attempted to raise her drain but twice it was lowered because of tachycardia and hypertension.  I am not convinced that the tachycardia and hypertension have anything to do with her ventriculostomy/hydrocephalus.  Nonetheless we will continue her drain throughout the weekend.  LOS: 11 days     [name] [name] 05/30/2024, 6:37 AM     Patient ID: [name] [name], female   DOB: 1980/09/13, 43 y.o.   MRN: [id number]

## 2024-05-31 ENCOUNTER — Inpatient Hospital Stay (HOSPITAL_COMMUNITY)

## 2024-05-31 DIAGNOSIS — I610 Nontraumatic intracerebral hemorrhage in hemisphere, subcortical: Secondary | ICD-10-CM | POA: Diagnosis not present

## 2024-05-31 DIAGNOSIS — R509 Fever, unspecified: Secondary | ICD-10-CM | POA: Diagnosis not present

## 2024-05-31 DIAGNOSIS — D72829 Elevated white blood cell count, unspecified: Secondary | ICD-10-CM

## 2024-05-31 DIAGNOSIS — I161 Hypertensive emergency: Secondary | ICD-10-CM | POA: Diagnosis not present

## 2024-05-31 LAB — CBC WITH DIFFERENTIAL/PLATELET
Abs Immature Granulocytes: 0.09 K/uL — ABNORMAL HIGH (ref 0.00–0.07)
Basophils Absolute: 0 K/uL (ref 0.0–0.1)
Basophils Relative: 1 %
Eosinophils Absolute: 0.1 K/uL (ref 0.0–0.5)
Eosinophils Relative: 1 %
HCT: 28.2 % — ABNORMAL LOW (ref 36.0–46.0)
Hemoglobin: 9 g/dL — ABNORMAL LOW (ref 12.0–15.0)
Immature Granulocytes: 2 %
Lymphocytes Relative: 20 %
Lymphs Abs: 1.1 K/uL (ref 0.7–4.0)
MCH: 30.1 pg (ref 26.0–34.0)
MCHC: 31.9 g/dL (ref 30.0–36.0)
MCV: 94.3 fL (ref 80.0–100.0)
Monocytes Absolute: 0.5 K/uL (ref 0.1–1.0)
Monocytes Relative: 9 %
Neutro Abs: 3.8 K/uL (ref 1.7–7.7)
Neutrophils Relative %: 67 %
Platelets: 185 K/uL (ref 150–400)
RBC: 2.99 MIL/uL — ABNORMAL LOW (ref 3.87–5.11)
RDW: 13.1 % (ref 11.5–15.5)
WBC: 5.6 K/uL (ref 4.0–10.5)
nRBC: 0 % (ref 0.0–0.2)

## 2024-05-31 LAB — GLUCOSE, CAPILLARY
Glucose-Capillary: 160 mg/dL — ABNORMAL HIGH (ref 70–99)
Glucose-Capillary: 159 mg/dL — ABNORMAL HIGH (ref 70–99)
Glucose-Capillary: 193 mg/dL — ABNORMAL HIGH (ref 70–99)
Glucose-Capillary: 219 mg/dL — ABNORMAL HIGH (ref 70–99)
Glucose-Capillary: 162 mg/dL — ABNORMAL HIGH (ref 70–99)
Glucose-Capillary: 158 mg/dL — ABNORMAL HIGH (ref 70–99)

## 2024-05-31 LAB — MAGNESIUM: Magnesium: 2.5 mg/dL — ABNORMAL HIGH (ref 1.7–2.4)

## 2024-05-31 LAB — BASIC METABOLIC PANEL WITH GFR
Anion gap: 18 — ABNORMAL HIGH (ref 5–15)
BUN: 47 mg/dL — ABNORMAL HIGH (ref 6–20)
CO2: 24 mmol/L (ref 22–32)
Calcium: 10.6 mg/dL — ABNORMAL HIGH (ref 8.9–10.3)
Chloride: 90 mmol/L — ABNORMAL LOW (ref 98–111)
Creatinine, Ser: 2.9 mg/dL — ABNORMAL HIGH (ref 0.44–1.00)
GFR, Estimated: 20 mL/min — ABNORMAL LOW (ref >60)
Glucose, Bld: 153 mg/dL — ABNORMAL HIGH (ref 70–99)
Potassium: 3.9 mmol/L (ref 3.5–5.1)
Sodium: 132 mmol/L — ABNORMAL LOW (ref 135–145)

## 2024-05-31 LAB — PHOSPHORUS: Phosphorus: 4 mg/dL (ref 2.5–4.6)

## 2024-05-31 MED ORDER — IPRATROPIUM-ALBUTEROL 0.5-2.5 (3) MG/3ML IN SOLN
3.0000 mL | RESPIRATORY_TRACT | Status: AC | PRN
  Administered 2024-05-31: 3 mL via RESPIRATORY_TRACT
  Filled 2024-05-31: qty 3

## 2024-05-31 MED ORDER — HYDRALAZINE HCL 50 MG PO TABS
100.0000 mg | ORAL_TABLET | ORAL | Status: DC
  Administered 2024-05-31 – 2024-06-01 (×2): 100 mg
  Filled 2024-05-31 (×2): qty 2

## 2024-05-31 MED ORDER — ARFORMOTEROL TARTRATE 15 MCG/2ML IN NEBU
15.0000 ug | INHALATION_SOLUTION | Freq: Two times a day (BID) | RESPIRATORY_TRACT | Status: DC
  Administered 2024-05-31 – 2024-06-11 (×23): 15 ug via RESPIRATORY_TRACT
  Filled 2024-05-31 (×23): qty 2

## 2024-05-31 MED ORDER — CARVEDILOL 12.5 MG PO TABS
50.0000 mg | ORAL_TABLET | Freq: Two times a day (BID) | ORAL | Status: DC
  Administered 2024-05-31 – 2024-06-05 (×9): 50 mg
  Filled 2024-05-31 (×9): qty 4

## 2024-05-31 MED ORDER — INSULIN ASPART 100 UNIT/ML IJ SOLN
0.0000 [IU] | INTRAMUSCULAR | Status: DC
  Administered 2024-05-31: 2 [IU] via SUBCUTANEOUS
  Administered 2024-05-31 – 2024-06-01 (×7): 1 [IU] via SUBCUTANEOUS
  Administered 2024-06-01: 2 [IU] via SUBCUTANEOUS
  Administered 2024-06-01: 1 [IU] via SUBCUTANEOUS
  Administered 2024-06-01: 2 [IU] via SUBCUTANEOUS
  Administered 2024-06-01: 1 [IU] via SUBCUTANEOUS
  Administered 2024-06-01: 2 [IU] via SUBCUTANEOUS
  Administered 2024-06-02 – 2024-06-03 (×3): 1 [IU] via SUBCUTANEOUS
  Administered 2024-06-03: 3 [IU] via SUBCUTANEOUS
  Administered 2024-06-03 – 2024-06-04 (×7): 1 [IU] via SUBCUTANEOUS
  Administered 2024-06-05: 3 [IU] via SUBCUTANEOUS
  Administered 2024-06-05 (×2): 1 [IU] via SUBCUTANEOUS

## 2024-05-31 MED ORDER — LABETALOL HCL 5 MG/ML IV SOLN
20.0000 mg | Freq: Once | INTRAVENOUS | Status: AC | PRN
  Administered 2024-05-31: 20 mg via INTRAVENOUS
  Filled 2024-05-31: qty 4

## 2024-05-31 NOTE — Progress Notes (Signed)
 eLink Physician-Brief Progress Note Patient Name: [name] [name] DOB: 04-13-1981 MRN: [id number]   Date of Service  05/31/2024  HPI/Events of Note  Patient with bilateral wheezing.  eICU Interventions  Duonebs + CXR ordered.        Josephina Melcher U Adarrius Graeff 05/31/2024, 4:49 AM

## 2024-05-31 NOTE — Progress Notes (Signed)
 Subjective: Patient reports no complaints  Objective: Vital signs in last 24 hours: Temp:  [99.1 F (37.3 C)-102.3 F (39.1 C)] 100.9 F (38.3 C) (09/27 0400) Pulse Rate:  [99-131] 107 (09/27 0700) Resp:  [13-40] 25 (09/27 0700) BP: (102-178)/(53-83) 147/67 (09/27 0700) SpO2:  [90 %-100 %] 100 % (09/27 0700) Weight:  [48.9 kg] 48.9 kg (09/27 0500)  Intake/Output from previous day: 09/26 0701 - 09/27 0700 In: 1520 [NG/GT:1200] Out: 1639 [Drains:139] Intake/Output this shift: No intake/output data recorded.  Patient remains confused and nonverbal moves all extremities symmetrically  Lab Results: Recent Labs    05/30/24 0207 05/31/24 0448  WBC 4.1 5.6  HGB 8.9* 9.0*  HCT 27.7* 28.2*  PLT 195 185   BMET Recent Labs    05/30/24 0207 05/31/24 0448  NA 130* 132*  K 4.7 3.9  CL 90* 90*  CO2 24 24  GLUCOSE 178* 153*  BUN 70* 47*  CREATININE 3.93* 2.90*  CALCIUM  10.6* 10.6*    Studies/Results: DG Chest Port 1 View Result Date: 05/31/2024 EXAM: 1 VIEW(S) XRAY OF THE CHEST 05/31/2024 05:04:29 AM COMPARISON: 05/26/2024 CLINICAL HISTORY: Respiratory distress FINDINGS: LUNGS AND PLEURA: Low lung volumes accentuating pulmonary vasculature and cardiomediastinal silhouette. No focal pulmonary opacity. No pulmonary edema. No pleural effusion. No pneumothorax. HEART AND MEDIASTINUM: Cardiomegaly, stable. The cardiomediastinal silhouette is accentuated due to low lung volumes. BONES AND SOFT TISSUES: Stable calcified right breast lesion. No acute osseous abnormality. IMPRESSION: 1. No acute cardiopulmonary process identified. Electronically signed by: Waddell Calk MD 05/31/2024 05:48 AM EDT RP Workstation: [id number]   CT HEAD WO CONTRAST ( ) Result Date: 05/30/2024 EXAM: CT HEAD WITHOUT CONTRAST 05/30/2024 04:58:16 AM TECHNIQUE: CT of the head was performed without the administration of intravenous contrast. Automated exposure control, iterative reconstruction, and/or weight  based adjustment of the mA/kV was utilized to reduce the radiation dose to as low as reasonably achievable. COMPARISON: CT of the head dated 05/22/2024. CLINICAL HISTORY: Hydrocephalus. FINDINGS: BRAIN AND VENTRICLES: Interval evolution and marginal improvement of extensive interventricular hemorrhage, which remains more pronounced in the right lateral ventricle. Intraparenchymal hemorrhage along the ventriculostomy catheter has nearly resolved. The ventricles are stable in size. There are encephalomalacia changes again demonstrated within the left cerebellar hemisphere. No evidence of acute infarct. No extra-axial collection. No mass effect or midline shift. ORBITS: No acute abnormality. SINUSES: No acute abnormality. SOFT TISSUES AND SKULL: No acute soft tissue abnormality. No skull fracture. IMPRESSION: 1. Interval evolution with marginal improvement of extensive intraventricular hemorrhage, greatest in the right lateral ventricle. 2. Nearly resolved intraparenchymal hemorrhage along the ventriculostomy catheter. 3. Encephalomalacia in the left cerebellar hemisphere. Electronically signed by: Evalene Coho MD 05/30/2024 05:29 AM EDT RP Workstation: [id number]    Assessment/Plan: Continue EVD functioning well repeat challenge versus shunting per Dr. Mavis  LOS: 12 days     Arley SHAUNNA Helling 05/31/2024, 7:43 AM

## 2024-05-31 NOTE — Progress Notes (Addendum)
 Patient ID: [name] [name], female   DOB: Jan 04, 1981, 43 y.o.   MRN: [id number] [facility] Progress Note   Assessment/ Plan:   1.  Acute right intracranial hemorrhage: Associated with uncontrolled hypertension and status post ventriculostomy.  Extubated 9/22 and plans noted by neurosurgery to continue current strategy for ventriculostomy drainage after limitations yesterday with weaning. 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 3. Anemia: Hemoglobin/hematocrit slightly improved with ongoing overt mild loss from ventriculostomy.  4. CKD-MBD: Elevated calcium  and phosphorus levels noted, continue to monitor on current tube feeds. Not on calcium  supplements, will check PTH.  5. Nutrition: Ongoing tube feeds, monitor electrolytes/volume status. 6. Hypertension: Blood pressure remained elevated s/p HD/UF, increased hydralazine  to 100 mg TID.  Subjective:   Febrile overnight without any leukocytosis this morning.   Objective:   BP (!) 168/75 (BP Location: Left Arm)   Pulse (!) 114   Temp (!) 100.6 F (38.1 C) (Axillary)   Resp (!) 27   Ht 5' 3 (1.6 m)   Wt 48.9 kg   SpO2 100%   BMI 19.10 kg/m   Physical Exam: Gen: appears comfortable resting in bed. On oxygen  via [place]. Ventriculostomy drain in place CVS: Pulse regular tachycardia, S1 and S2 normal Resp: Anteriorly clear to auscultation, no rales/rhonchi Abd: Soft, flat, nontender, bowel sounds normal Ext: No lower extremity edema.  Right upper arm AV fistula with intact dressings Labs: BMET Recent Labs  Lab 05/25/24 0341 05/26/24 0733 05/27/24 0452 05/27/24 1614 05/28/24 0454  05/29/24 0648 05/30/24 0207 05/31/24 0448  NA 129* 127* 132* 128* 131* 131* 130* 132*  K 5.9* 5.4* 3.6 4.3 4.0 3.7 4.7 3.9  CL 90* 88* 93*  --  88* 90* 90* 90*  CO2 23 22 26   --  27 26 24 24   GLUCOSE 126* 130* 141*  --  143* 143* 178* 153*  BUN 60* 94* 38*  --  70* 40* 70* 47*  CREATININE 3.89* 5.19* 2.57*  --  4.06* 2.70* 3.93* 2.90*  CALCIUM  10.1 10.7* 9.8  --  10.7* 11.0* 10.6* 10.6*  PHOS 6.0* 6.8* 3.4  --  6.0* 4.1 4.5 4.0   CBC Recent Labs  Lab 05/26/24 0733 05/27/24 0452 05/28/24 0454 05/29/24 0648 05/30/24 0207 05/31/24 0448  WBC 3.9*   < > 4.6 3.9* 4.1 5.6  NEUTROABS 2.5  --   --   --   --  3.8  HGB 8.7*   < > 8.7* 9.2* 8.9* 9.0*  HCT 27.7*   < > 27.2* 28.5* 27.7* 28.2*  MCV 97.2   < > 95.8 94.4 94.2 94.3  PLT 147*   < > 169 165 195 185   < > = values in this interval not displayed.      Medications:     amLODipine   10 mg Per Tube Once per day on Sunday Tuesday Thursday Saturday   carvedilol   50 mg Per Tube 2 times per day on Sunday Tuesday Thursday Saturday   Chlorhexidine  Gluconate Cloth  6 each Topical Q0600   cloNIDine   0.3 mg Per Tube 3 times per day on Sunday Tuesday Thursday Saturday   cycloSPORINE   200 mg Per Tube QPM   cycloSPORINE   225 mg Per Tube Daily  darbepoetin (ARANESP ) injection - DIALYSIS  100 mcg Subcutaneous Q Wed-1800   feeding supplement (PROSource TF20)  60 mL Per Tube Daily   heparin  injection (subcutaneous)  5,000 Units Subcutaneous Q12H   hydrALAZINE   75 mg Per Tube 3 times per day on Sunday Tuesday Thursday Saturday   insulin  aspart  0-6 Units Subcutaneous Q4H   lacosamide   100 mg Per Tube BID   mycophenolate   500 mg Per Tube BID   mouth rinse  15 mL Mouth Rinse 4 times per day   pantoprazole  (PROTONIX ) IV  40 mg Intravenous QHS   predniSONE   15 mg Per Tube Q breakfast   Gordy Blanch, MD 05/31/2024, 9:16 AM

## 2024-05-31 NOTE — Progress Notes (Signed)
 NAME:  [name] [name], MRN:  [id number], DOB:  Oct 25, 1980, LOS: 12 ADMISSION DATE:  05/19/2024 CONSULTATION DATE:  9/15/20025 REFERRING MD: Patsey - EDP, CHIEF COMPLAINT:  Code Stroke, ICH   History of Present Illness:  43 year old woman who presented to [facility] ED 9/15 as a Code Stroke. 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 Recent admissions 6/11-7/28 and 7/29-8/13 for ICH.  Patient presented to [facility] ED from dialysis as a Code Stroke; reportedly LKW 1450 with development of obtundation and hemiparesis. SBP 200s. NIHSS 31 with initial leftward gaze, decreased LOC/disorientation, bilateral facial droop/arm weakness/leg weakness, global aphasia, dysarthria, visual neglect. CT Head demonstrated large volume IVH with moderate hydrocephalus, acute hemorrhage of medial R temporal lobe and small foci of residual/recurrent hemorrhage in the L cerebellar hemisphere.   Patient was seen by stroke team and neurosurgery, EVD was placed on left side, PCCM was consulted for medical evaluation and admission to neuro ICU  Pertinent Medical History:   Past Medical History:  Diagnosis Date   Anemia    Anxiety    DM (diabetes mellitus), type 1 (HCC)    ESRD on hemodialysis (HCC)    Gastroparesis    History of simultaneous kidney and pancreas transplant (HCC) 2014   Hypertension    Ischemic cerebrovascular accident (CVA) (HCC) 11/2021   Narcotic abuse (HCC)    Non compliance with medical treatment    Stroke [facility])    Vision loss    Significant Hospital Events: Including procedures, antibiotic start and stop dates in addition to other pertinent events   9/15 admitted with ICH plus IVH, EVD was placed for hydrocephalus 9/16 remain on clevidipine  infusion, tolerating spontaneous breathing trial, EVD is draining bloody fluid 9/17 continue to  require clevidipine  infusion, tolerated spontaneous breathing trial 9/18: No overnight issues. Currently off sedation. Still on high-dose `clevidipine  infusion 9/19: this am, dialysis ongoing. Neurosx plans to continue ventriculostomy for now. Remains on cleviprex  to maintain BP goal. Tmax 100.1. despite plt decrease 4T score 0. Will maintain heparin  as is for now and closely follow trend.  9/27 Spiking low grade fevers, wheezing on exam, CXR normal   Interim History / Subjective:  HD session: 9/24-2.5 L removed, hypotensive post HD. EVD elevated this a.m. to 30, on repeat CT scheduled for 9/26-possible removal Otherwise open eyes to voice, does not follow commands   Objective:  Blood pressure (!) 168/75, pulse (!) 114, temperature (!) 100.6 F (38.1 C), temperature source Axillary, resp. rate (!) 27, height 5' 3 (1.6 m), weight 48.9 kg, SpO2 100%.        Intake/Output Summary (Last 24 hours) at 05/31/2024 0945 Last data filed at 05/31/2024 0800 Gross per 24 hour  Intake 1520 ml  Output 1654 ml  Net -134 ml   Filed Weights   05/30/24 0500 05/30/24 0730 05/31/24 0500  Weight: 49.3 kg 49.3 kg 48.9 kg   Physical Examination: General: Ill-appearing,  minimal responsiveness, did not open eyes to stimulation Chest: bilateral wheezing  Heart: Regular rate and rhythm, S1, S2 Extremities: Noted muscle wasting, limited movement on the right Neuro: Left-sided gaze preference, minimal movement of the right    Assessment & Plan:  Acute right parietotemporal intraparenchymal hemorrhage with IVH with ICH score 2 due to uncontrolled hypertension Hypertensive emergency Obstructive hydrocephalus status post EVD Acute left parietal ischemic stroke was ruled out Acute encephalopathy in the setting of  ICH and hydrocephalus Acute respiratory insufficiency Serratia/klebsiella pneumonia End-stage renal disease on hemodialysis with failed renal transplant but functional pancreatic transplant on  immunosuppressive therapy Hypervolemic hyponatremia, hypochloremia  Type 1 diabetes, complicated with vasculopathy and gastroparesis- not on long term insulin  anymore Narcotic abuse Moderate protein calorie malnutrition Anemia, thrombocytopenia- am labs pending Stage 1 pressure ulcer POA #New onset low grade fevers without leukocytosis.  She had completed a 7 day course of antibiotics, resp culture growing serratia and kleb pneumo. Monitor fever curve for now. Low threshold to obtain another infectious workup and restart antibiotics. CXR has been clear   - EVD per neurosurgery -EVD elevated on 9/25 to 30, repeat CT today with interval evolution with marginal improvement of extensive intraventricular hemorrhage, greatest in the right lateral ventricle and nearly resolved intraparenchymal hemorrhage along the ventriculostomy Catheter (reassuring findings). Per NSG: till has quite a bit of intraventricular blood on the right and mild hydrocephalus. Possible HTN and tachycardia unrelated to EVD drain  - Continue home Vimpat  - Continue Tylenol  as needed fever -Hold HTN meds on HD days except coreg  continue daily  -Brittle hemodynamics severe hypertension, with severe hypotension post HD - continue coreg  50BID, , hydralazine  100 q8hrs, clonidine  0.3 TID,amlodipine  10 daily.  -Continue tube feeds Nepro at goal, TF per PEG -SSI - Anti-rejection meds for pancreatic transplant as ordered - on subcu heparin  5 BID - No Foley, patient is anuric, but will do bladder scan to rule out retention.    Zola LOISE Herter, MD [facility] 05/31/2024 9:45 AM

## 2024-06-01 DIAGNOSIS — I610 Nontraumatic intracerebral hemorrhage in hemisphere, subcortical: Secondary | ICD-10-CM | POA: Diagnosis not present

## 2024-06-01 DIAGNOSIS — I161 Hypertensive emergency: Secondary | ICD-10-CM | POA: Diagnosis not present

## 2024-06-01 DIAGNOSIS — R509 Fever, unspecified: Secondary | ICD-10-CM | POA: Diagnosis not present

## 2024-06-01 DIAGNOSIS — D72829 Elevated white blood cell count, unspecified: Secondary | ICD-10-CM | POA: Diagnosis not present

## 2024-06-01 LAB — CBC WITH DIFFERENTIAL/PLATELET
Abs Immature Granulocytes: 0.08 K/uL — ABNORMAL HIGH (ref 0.00–0.07)
Basophils Absolute: 0 K/uL (ref 0.0–0.1)
Basophils Relative: 0 %
Eosinophils Absolute: 0.1 K/uL (ref 0.0–0.5)
Eosinophils Relative: 2 %
HCT: 26.5 % — ABNORMAL LOW (ref 36.0–46.0)
Hemoglobin: 8.4 g/dL — ABNORMAL LOW (ref 12.0–15.0)
Immature Granulocytes: 1 %
Lymphocytes Relative: 21 %
Lymphs Abs: 1.2 K/uL (ref 0.7–4.0)
MCH: 30.1 pg (ref 26.0–34.0)
MCHC: 31.7 g/dL (ref 30.0–36.0)
MCV: 95 fL (ref 80.0–100.0)
Monocytes Absolute: 0.4 K/uL (ref 0.1–1.0)
Monocytes Relative: 6 %
Neutro Abs: 4 K/uL (ref 1.7–7.7)
Neutrophils Relative %: 70 %
Platelets: 194 K/uL (ref 150–400)
RBC: 2.79 MIL/uL — ABNORMAL LOW (ref 3.87–5.11)
RDW: 13.4 % (ref 11.5–15.5)
WBC: 5.8 K/uL (ref 4.0–10.5)
nRBC: 0 % (ref 0.0–0.2)

## 2024-06-01 LAB — GLUCOSE, CAPILLARY
Glucose-Capillary: 164 mg/dL — ABNORMAL HIGH (ref 70–99)
Glucose-Capillary: 180 mg/dL — ABNORMAL HIGH (ref 70–99)
Glucose-Capillary: 220 mg/dL — ABNORMAL HIGH (ref 70–99)
Glucose-Capillary: 250 mg/dL — ABNORMAL HIGH (ref 70–99)
Glucose-Capillary: 201 mg/dL — ABNORMAL HIGH (ref 70–99)
Glucose-Capillary: 161 mg/dL — ABNORMAL HIGH (ref 70–99)

## 2024-06-01 LAB — BASIC METABOLIC PANEL WITH GFR
Anion gap: 15 (ref 5–15)
BUN: 83 mg/dL — ABNORMAL HIGH (ref 6–20)
CO2: 27 mmol/L (ref 22–32)
Calcium: 11.1 mg/dL — ABNORMAL HIGH (ref 8.9–10.3)
Chloride: 92 mmol/L — ABNORMAL LOW (ref 98–111)
Creatinine, Ser: 4.7 mg/dL — ABNORMAL HIGH (ref 0.44–1.00)
GFR, Estimated: 11 mL/min — ABNORMAL LOW
Glucose, Bld: 156 mg/dL — ABNORMAL HIGH (ref 70–99)
Potassium: 4.7 mmol/L (ref 3.5–5.1)
Sodium: 134 mmol/L — ABNORMAL LOW (ref 135–145)

## 2024-06-01 LAB — MAGNESIUM: Magnesium: 2.9 mg/dL — ABNORMAL HIGH (ref 1.7–2.4)

## 2024-06-01 MED ORDER — CLONIDINE HCL 0.2 MG PO TABS
0.2000 mg | ORAL_TABLET | ORAL | Status: DC
  Administered 2024-06-01 – 2024-06-05 (×8): 0.2 mg
  Filled 2024-06-01 (×8): qty 1

## 2024-06-01 MED ORDER — HYDRALAZINE HCL 50 MG PO TABS
75.0000 mg | ORAL_TABLET | ORAL | Status: DC
  Administered 2024-06-01 – 2024-06-05 (×5): 75 mg
  Filled 2024-06-01 (×5): qty 1

## 2024-06-01 NOTE — Progress Notes (Signed)
 NAME:  [name] [name], MRN:  [id number], DOB:  08-07-1981, LOS: 13 ADMISSION DATE:  05/19/2024 CONSULTATION DATE:  9/15/20025 REFERRING MD: Patsey - EDP, CHIEF COMPLAINT:  Code Stroke, ICH   History of Present Illness:  43 year old woman who presented to [facility] ED 9/15 as a Code Stroke. 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 Recent admissions 6/11-7/28 and 7/29-8/13 for ICH.  Patient presented to [facility] ED from dialysis as a Code Stroke; reportedly LKW 1450 with development of obtundation and hemiparesis. SBP 200s. NIHSS 31 with initial leftward gaze, decreased LOC/disorientation, bilateral facial droop/arm weakness/leg weakness, global aphasia, dysarthria, visual neglect. CT Head demonstrated large volume IVH with moderate hydrocephalus, acute hemorrhage of medial R temporal lobe and small foci of residual/recurrent hemorrhage in the L cerebellar hemisphere.   Patient was seen by stroke team and neurosurgery, EVD was placed on left side, PCCM was consulted for medical evaluation and admission to neuro ICU  Pertinent Medical History:   Past Medical History:  Diagnosis Date   Anemia    Anxiety    DM (diabetes mellitus), type 1 (HCC)    ESRD on hemodialysis (HCC)    Gastroparesis    History of simultaneous kidney and pancreas transplant (HCC) 2014   Hypertension    Ischemic cerebrovascular accident (CVA) (HCC) 11/2021   Narcotic abuse (HCC)    Non compliance with medical treatment    Stroke [facility])    Vision loss    Significant Hospital Events: Including procedures, antibiotic start and stop dates in addition to other pertinent events   9/15 admitted with ICH plus IVH, EVD was placed for hydrocephalus 9/16 remain on clevidipine  infusion, tolerating spontaneous breathing trial, EVD is draining bloody fluid 9/17 continue to  require clevidipine  infusion, tolerated spontaneous breathing trial 9/18: No overnight issues. Currently off sedation. Still on high-dose `clevidipine  infusion 9/19: this am, dialysis ongoing. Neurosx plans to continue ventriculostomy for now. Remains on cleviprex  to maintain BP goal. Tmax 100.1. despite plt decrease 4T score 0. Will maintain heparin  as is for now and closely follow trend.  9/27 Spiking low grade fevers, wheezing on exam, CXR normal   Interim History / Subjective:     Objective:  Blood pressure 119/62, pulse 91, temperature 97.9 F (36.6 C), temperature source Axillary, resp. rate 17, height 5' 3 (1.6 m), weight 48.9 kg, SpO2 100%.        Intake/Output Summary (Last 24 hours) at 06/01/2024 0959 Last data filed at 06/01/2024 0800 Gross per 24 hour  Intake 1200 ml  Output 573 ml  Net 627 ml   Filed Weights   05/30/24 0500 05/30/24 0730 05/31/24 0500  Weight: 49.3 kg 49.3 kg 48.9 kg   Physical Examination: General: Ill-appearing,  minimal responsiveness, did not open eyes to stimulation Chest: bilateral wheezing  Heart: Regular rate and rhythm, S1, S2 Extremities: Noted muscle wasting, limited movement on the right Neuro: Left-sided gaze preference, minimal movement of the right    Assessment & Plan:  Acute right parietotemporal intraparenchymal hemorrhage with IVH with ICH score 2 due to uncontrolled hypertension Hypertensive emergency Obstructive hydrocephalus status post EVD Acute left parietal ischemic stroke was ruled out Acute encephalopathy in the setting of ICH and hydrocephalus Acute respiratory insufficiency Serratia/klebsiella pneumonia End-stage renal disease on hemodialysis with failed renal transplant but functional pancreatic transplant on immunosuppressive therapy Hypervolemic hyponatremia, hypochloremia  Type 1 diabetes, complicated with  vasculopathy and gastroparesis- not on long term insulin  anymore Narcotic abuse Moderate protein calorie  malnutrition Anemia, thrombocytopenia- am labs pending Stage 1 pressure ulcer POA #New onset low grade fevers without leukocytosis.  She had completed a 7 day course of antibiotics, resp culture growing serratia and kleb pneumo. Monitor fever curve for now. Low threshold to restart antibiotics. CXR has been clear. Blood cultures repeated today   - EVD per neurosurgery -EVD elevated on 9/25 to 30, repeat CT today with interval evolution with marginal improvement of extensive intraventricular hemorrhage, greatest in the right lateral ventricle and nearly resolved intraparenchymal hemorrhage along the ventriculostomy Catheter (reassuring findings). Per NSG: till has quite a bit of intraventricular blood on the right and mild hydrocephalus. Possible HTN and tachycardia unrelated to EVD drain. Plan to keep drain over the weekend   - Continue home Vimpat  - Continue Tylenol  as needed fever -Hold HTN meds on HD days except coreg  continue daily  -Brittle hemodynamics severe hypertension, with severe hypotension post HD - continue coreg  50BID, , hydralazine  100 q8hrs, reduced clonidine  to 0.2 TID instead of 0.3,amlodipine  10 daily.  -Continue tube feeds Nepro at goal, TF per PEG -SSI - Anti-rejection meds for pancreatic transplant as ordered - on subcu heparin  5 BID - No Foley, patient is anuric, but will do bladder scan to rule out retention.    [name] LOISE Herter, MD [facility] 06/01/2024 9:59 AM

## 2024-06-01 NOTE — Progress Notes (Signed)
 Patient ID: [name] [name], female   DOB: May 06, 1981, 43 y.o.   MRN: [id number] No significant change neurologically remains obtunded not follows commands but moves all extremities well  EVD functioning well putting out about 140 cc a shift  Continue drain for now Dr. Mavis to challenge this week

## 2024-06-01 NOTE — Progress Notes (Signed)
 Patient ID: [name] [name], female   DOB: 1980-11-18, 43 y.o.   MRN: [id number] [facility] Progress Note   Assessment/ Plan:   1.  Acute right intracranial hemorrhage: Associated with uncontrolled hypertension and status post ventriculostomy.  Extubated 9/22 and plans noted by neurosurgery to continue current strategy for ventriculostomy drainage with weaning effort again next week. 2. ESRD following failure of renal allograft: Continue hemodialysis on Monday/Wednesday/Friday schedule via right BCF with next HD tomorrow.  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 3. Anemia: Hemoglobin/hematocrit slightly improved with ongoing overt mild loss from ventriculostomy.  4. CKD-MBD: Elevated calcium  and phosphorus levels noted, continue to monitor on current tube feeds. Not on calcium  supplements, will check PTH.  5. Nutrition: Ongoing tube feeds, monitor electrolytes/volume status. 6. Hypertension: Blood pressure now lower and I have decreased hydralazine  dose to 75mg  TID.  Subjective:   Continues to have episodes of fever without leukocytosis or evidence of infection. Mother concerned with low blood pressures.    Objective:   BP 119/62 (BP Location: Left Arm)   Pulse 91   Temp 97.9 F (36.6 C) (Axillary)   Resp 17   Ht 5' 3 (1.6 m)   Wt 48.9 kg   SpO2 100%   BMI 19.10 kg/m   Physical Exam: Gen: Comfortably resting in bed, unresponsive. Ventriculostomy drain in place CVS: Pulse regular tachycardia, S1 and S2 normal Resp: Anteriorly clear to auscultation, no rales/rhonchi Abd: Soft, flat, nontender, bowel sounds normal Ext: No lower extremity edema.  Right upper arm AV fistula with intact dressings Labs: BMET Recent Labs  Lab 05/26/24 0733 05/27/24 0452 05/27/24 1614 05/28/24 0454 05/29/24 0648 05/30/24 0207 05/31/24 0448  06/01/24 0553  NA 127* 132* 128* 131* 131* 130* 132* 134*  K 5.4* 3.6 4.3 4.0 3.7 4.7 3.9 4.7  CL 88* 93*  --  88* 90* 90* 90* 92*  CO2 22 26  --  27 26 24 24 27   GLUCOSE 130* 141*  --  143* 143* 178* 153* 156*  BUN 94* 38*  --  70* 40* 70* 47* 83*  CREATININE 5.19* 2.57*  --  4.06* 2.70* 3.93* 2.90* 4.70*  CALCIUM  10.7* 9.8  --  10.7* 11.0* 10.6* 10.6* 11.1*  PHOS 6.8* 3.4  --  6.0* 4.1 4.5 4.0  --    CBC Recent Labs  Lab 05/26/24 0733 05/27/24 0452 05/29/24 0648 05/30/24 0207 05/31/24 0448 06/01/24 0553  WBC 3.9*   < > 3.9* 4.1 5.6 5.8  NEUTROABS 2.5  --   --   --  3.8 4.0  HGB 8.7*   < > 9.2* 8.9* 9.0* 8.4*  HCT 27.7*   < > 28.5* 27.7* 28.2* 26.5*  MCV 97.2   < > 94.4 94.2 94.3 95.0  PLT 147*   < > 165 195 185 194   < > = values in this interval not displayed.      Medications:     amLODipine   10 mg Per Tube Once per day on Sunday Tuesday Thursday Saturday   arformoterol  15 mcg Nebulization BID   carvedilol   50 mg Per Tube BID WC   Chlorhexidine  Gluconate Cloth  6 each Topical Q0600   cloNIDine   0.3 mg Per Tube 3 times per day on Sunday Tuesday Thursday Saturday   cycloSPORINE   200 mg Per Tube QPM   cycloSPORINE   225 mg Per Tube Daily  darbepoetin (ARANESP ) injection - DIALYSIS  100 mcg Subcutaneous Q Wed-1800   feeding supplement (PROSource TF20)  60 mL Per Tube Daily   heparin  injection (subcutaneous)  5,000 Units Subcutaneous Q12H   hydrALAZINE   75 mg Per Tube 3 times per day on Sunday Tuesday Thursday Saturday   insulin  aspart  0-6 Units Subcutaneous Q4H   lacosamide   100 mg Per Tube BID   mycophenolate   500 mg Per Tube BID   mouth rinse  15 mL Mouth Rinse 4 times per day   pantoprazole  (PROTONIX ) IV  40 mg Intravenous QHS   predniSONE   15 mg Per Tube Q breakfast   Gordy Blanch, MD 06/01/2024, 9:12 AM

## 2024-06-02 DIAGNOSIS — I610 Nontraumatic intracerebral hemorrhage in hemisphere, subcortical: Secondary | ICD-10-CM

## 2024-06-02 DIAGNOSIS — I161 Hypertensive emergency: Secondary | ICD-10-CM | POA: Diagnosis not present

## 2024-06-02 DIAGNOSIS — Z992 Dependence on renal dialysis: Secondary | ICD-10-CM | POA: Diagnosis not present

## 2024-06-02 DIAGNOSIS — N186 End stage renal disease: Secondary | ICD-10-CM | POA: Diagnosis not present

## 2024-06-02 DIAGNOSIS — D72829 Elevated white blood cell count, unspecified: Secondary | ICD-10-CM | POA: Diagnosis not present

## 2024-06-02 DIAGNOSIS — E1059 Type 1 diabetes mellitus with other circulatory complications: Secondary | ICD-10-CM | POA: Diagnosis not present

## 2024-06-02 DIAGNOSIS — L8990 Pressure ulcer of unspecified site, unspecified stage: Secondary | ICD-10-CM | POA: Insufficient documentation

## 2024-06-02 DIAGNOSIS — E440 Moderate protein-calorie malnutrition: Secondary | ICD-10-CM | POA: Diagnosis not present

## 2024-06-02 DIAGNOSIS — G911 Obstructive hydrocephalus: Secondary | ICD-10-CM | POA: Diagnosis not present

## 2024-06-02 DIAGNOSIS — J9600 Acute respiratory failure, unspecified whether with hypoxia or hypercapnia: Secondary | ICD-10-CM | POA: Diagnosis not present

## 2024-06-02 LAB — CBC WITH DIFFERENTIAL/PLATELET
Abs Immature Granulocytes: 0.06 K/uL (ref 0.00–0.07)
Basophils Absolute: 0 K/uL (ref 0.0–0.1)
Basophils Relative: 0 %
Eosinophils Absolute: 0.1 K/uL (ref 0.0–0.5)
Eosinophils Relative: 2 %
HCT: 27.2 % — ABNORMAL LOW (ref 36.0–46.0)
Hemoglobin: 8.7 g/dL — ABNORMAL LOW (ref 12.0–15.0)
Immature Granulocytes: 1 %
Lymphocytes Relative: 19 %
Lymphs Abs: 1.1 K/uL (ref 0.7–4.0)
MCH: 30.5 pg (ref 26.0–34.0)
MCHC: 32 g/dL (ref 30.0–36.0)
MCV: 95.4 fL (ref 80.0–100.0)
Monocytes Absolute: 0.4 K/uL (ref 0.1–1.0)
Monocytes Relative: 6 %
Neutro Abs: 4 K/uL (ref 1.7–7.7)
Neutrophils Relative %: 72 %
Platelets: 191 K/uL (ref 150–400)
RBC: 2.85 MIL/uL — ABNORMAL LOW (ref 3.87–5.11)
RDW: 14 % (ref 11.5–15.5)
WBC: 5.6 K/uL (ref 4.0–10.5)
nRBC: 0 % (ref 0.0–0.2)

## 2024-06-02 LAB — BLOOD CULTURE ID PANEL (REFLEXED) - BCID2

## 2024-06-02 LAB — GLUCOSE, CAPILLARY
Glucose-Capillary: 142 mg/dL — ABNORMAL HIGH (ref 70–99)
Glucose-Capillary: 146 mg/dL — ABNORMAL HIGH (ref 70–99)
Glucose-Capillary: 164 mg/dL — ABNORMAL HIGH (ref 70–99)
Glucose-Capillary: 171 mg/dL — ABNORMAL HIGH (ref 70–99)
Glucose-Capillary: 179 mg/dL — ABNORMAL HIGH (ref 70–99)
Glucose-Capillary: 184 mg/dL — ABNORMAL HIGH (ref 70–99)

## 2024-06-02 LAB — BASIC METABOLIC PANEL WITH GFR
Anion gap: 19 — ABNORMAL HIGH (ref 5–15)
BUN: 114 mg/dL — ABNORMAL HIGH (ref 6–20)
CO2: 24 mmol/L (ref 22–32)
Calcium: 11.3 mg/dL — ABNORMAL HIGH (ref 8.9–10.3)
Chloride: 91 mmol/L — ABNORMAL LOW (ref 98–111)
Creatinine, Ser: 5.72 mg/dL — ABNORMAL HIGH (ref 0.44–1.00)
GFR, Estimated: 9 mL/min — ABNORMAL LOW (ref >60)
Glucose, Bld: 175 mg/dL — ABNORMAL HIGH (ref 70–99)
Potassium: 5.6 mmol/L — ABNORMAL HIGH (ref 3.5–5.1)
Sodium: 134 mmol/L — ABNORMAL LOW (ref 135–145)

## 2024-06-02 LAB — PARATHYROID HORMONE, INTACT (NO CA): PTH: 29 pg/mL (ref 15–65)

## 2024-06-02 LAB — MAGNESIUM: Magnesium: 3 mg/dL — ABNORMAL HIGH (ref 1.7–2.4)

## 2024-06-02 NOTE — Progress Notes (Signed)
   06/02/24 1400  ICP/Ventriculostomy Ventricular drainage catheter Left Parietal region  Placement Date/Time: 05/19/24 1730   Person Inserting LDA: Luke Pean NP  Ventricular Device: Ventricular drainage catheter  Orientation: Left  Location: Parietal region  Drain Status Open to drain  Level Measured in cm H2O 30 cm H2O  Status Open to continuous drainage  CSF Color Serosanguineous  Site Assessment Clean;Dry  Dressing Status Clean, Dry, Intact  Dressing Intervention Assessed, no intervention needed  Output (mL) 0 mL   Notified Gerard Beck, NP of little to no drain output since the drain has been raised. CSF continues to have tidaling in the line. Neuro exam remains unchanged. Neurosurg NP believes patient is tolerating the drain being raised at this time.   06/02/2024 Charletta Bathe, RN, BSN 5:41 PM

## 2024-06-02 NOTE — Progress Notes (Signed)
   06/02/24 1256  Vitals  Temp 97.6 F (36.4 C)  Pulse Rate (!) 101  Resp (!) 25  BP (!) 154/69  SpO2 100 %  Post Treatment  Dialyzer Clearance Lightly streaked  Hemodialysis Intake (mL) 0 mL  Liters Processed 84  Fluid Removed (mL) 1800 mL  Tolerated HD Treatment Yes  AVG/AVF Arterial Site Held (minutes) 10 minutes  AVG/AVF Venous Site Held (minutes) 10 minutes   Received patient in bed to unit.  Alert and oriented.  Informed consent signed and in chart.   TX duration:3.5HRS  Patient tolerated well.  Transported back to the room  Alert, without acute distress.  Hand-off given to patient's nurse.   Access used: RUAVF Access issues: NONE  Total UF removed: 1.8L Medication(s) given: NONE    [facility]

## 2024-06-02 NOTE — Progress Notes (Signed)
 Subjective: The patient is alert.  She is in no apparent distress.  She is being dialyzed.  Her mother is at the bedside.  Objective: Vital signs in last 24 hours: Temp:  [98 F (36.7 C)-99.6 F (37.6 C)] 98.7 F (37.1 C) (09/29 0815) Pulse Rate:  [86-101] 94 (09/29 1015) Resp:  [15-30] 24 (09/29 1015) BP: (104-175)/(53-81) 150/71 (09/29 1015) SpO2:  [100 %] 100 % (09/29 1015) Estimated body mass index is 19.1 kg/m as calculated from the following:   Height as of this encounter: 5' 3 (1.6 m).   Weight as of this encounter: 48.9 kg.   Intake/Output from previous day: 09/28 0701 - 09/29 0700 In: 1250 [NG/GT:1250] Out: 221 [Drains:221] Intake/Output this shift: Total I/O In: 125 [Other:25; NG/GT:100] Out: 19 [Drains:19]  Physical exam the patient is alert.  She does not follow commands.  Her ventriculostomy is patent.  Lab Results: Recent Labs    06/01/24 0553 06/02/24 0548  WBC 5.8 5.6  HGB 8.4* 8.7*  HCT 26.5* 27.2*  PLT 194 191   BMET Recent Labs    06/01/24 0553 06/02/24 0548  NA 134* 134*  K 4.7 5.6*  CL 92* 91*  CO2 27 24  GLUCOSE 156* 175*  BUN 83* 114*  CREATININE 4.70* 5.72*  CALCIUM  11.1* 11.3*    Studies/Results: No results found.  Assessment/Plan: Intraventricular hemorrhage, hydrocephalus, ventriculostomy: The patient is stable.  We will attempt to raise her ventriculostomy again.  I have spoken with the patient's mother.  LOS: 14 days     [name] 06/02/2024, 10:32 AM     Patient ID: [name], female   DOB: Dec 04, 1980, 43 y.o.   MRN: [id number]

## 2024-06-02 NOTE — Progress Notes (Signed)
 PHARMACY - PHYSICIAN COMMUNICATION CRITICAL VALUE ALERT - BLOOD CULTURE IDENTIFICATION (BCID)  [name] [name] is an 43 y.o. female who presented to [facility] on 05/19/2024 with a chief complaint of stroke  Assessment:  1/4 Blood cultures growing MRSE (likely contaminant)   Name of physician (or Provider) Contacted: Dr. Gatha  Current antibiotics: none  Changes to prescribed antibiotics recommended:  No changes needed. Provider agrees with recommendation  Results for orders placed or performed during the hospital encounter of 05/19/24  Blood Culture ID Panel (Reflexed) (Collected: 06/01/2024 11:43 AM)  Result Value Ref Range   Enterococcus faecalis NOT DETECTED NOT DETECTED   Enterococcus Faecium NOT DETECTED NOT DETECTED   Listeria monocytogenes NOT DETECTED NOT DETECTED   Staphylococcus species DETECTED (A) NOT DETECTED   Staphylococcus aureus (BCID) NOT DETECTED NOT DETECTED   Staphylococcus epidermidis DETECTED (A) NOT DETECTED   Staphylococcus lugdunensis NOT DETECTED NOT DETECTED   Streptococcus species NOT DETECTED NOT DETECTED   Streptococcus agalactiae NOT DETECTED NOT DETECTED   Streptococcus pneumoniae NOT DETECTED NOT DETECTED   Streptococcus pyogenes NOT DETECTED NOT DETECTED   A.calcoaceticus-baumannii NOT DETECTED NOT DETECTED   Bacteroides fragilis NOT DETECTED NOT DETECTED   Enterobacterales NOT DETECTED NOT DETECTED   Enterobacter cloacae complex NOT DETECTED NOT DETECTED   Escherichia coli NOT DETECTED NOT DETECTED   Klebsiella aerogenes NOT DETECTED NOT DETECTED   Klebsiella oxytoca NOT DETECTED NOT DETECTED   Klebsiella pneumoniae NOT DETECTED NOT DETECTED   Proteus species NOT DETECTED NOT DETECTED   Salmonella species NOT DETECTED NOT DETECTED   Serratia marcescens NOT DETECTED NOT DETECTED   Haemophilus influenzae NOT DETECTED NOT DETECTED   Neisseria meningitidis NOT DETECTED NOT DETECTED   Pseudomonas aeruginosa NOT DETECTED NOT DETECTED    Stenotrophomonas maltophilia NOT DETECTED NOT DETECTED   Candida albicans NOT DETECTED NOT DETECTED   Candida auris NOT DETECTED NOT DETECTED   Candida glabrata NOT DETECTED NOT DETECTED   Candida krusei NOT DETECTED NOT DETECTED   Candida parapsilosis NOT DETECTED NOT DETECTED   Candida tropicalis NOT DETECTED NOT DETECTED   Cryptococcus neoformans/gattii NOT DETECTED NOT DETECTED   Methicillin resistance mecA/C DETECTED (A) NOT DETECTED    Elma Fail, PharmD PGY1 Clinical Pharmacist [facility]  06/02/2024 2:55 PM

## 2024-06-02 NOTE — Progress Notes (Signed)
 Patient ID: [name] [name], female   DOB: 1980/10/19, 43 y.o.   MRN: [id number] [facility] Progress Note   Assessment/ Plan:   1.  Acute right intracranial hemorrhage: Associated with uncontrolled hypertension and status post ventriculostomy.  Extubated 9/22 and plans noted by neurosurgery to continue current strategy for ventriculostomy drainage with weaning effort again next week. 2. ESRD following failure of renal allograft: Continue HD on MWF schedule via right BCF w/ next HD today.  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 3. Anemia: Hemoglobin/hematocrit slightly improved with ongoing overt mild loss from ventriculostomy.  4. CKD-MBD: Elevated calcium  and phosphorus levels noted, continue to monitor on current tube feeds. Not on Ca supps, PTH pending.  5. Nutrition: Ongoing tube feeds, monitor electrolytes/volume status. 6. Hypertension: Blood pressure's very high, then dropped low and now is getting norvasc , clonidine , hydralazine  on non HD days only, with coreg  25 mg bid daily. BP's stable.   Subjective:   Continues to have episodes of fever without leukocytosis or evidence of infection BP's were lower yest and today are better.    Objective:   BP (!) 151/70   Pulse 94   Temp 98.7 F (37.1 C)   Resp 15   Ht 5' 3 (1.6 m)   Wt 48.9 kg   SpO2 100%   BMI 19.10 kg/m   Physical Exam: Gen: Comfortably resting in bed, unresponsive. Ventriculostomy drain in place CVS: Pulse regular tachycardia, S1 and S2 normal Resp: Anteriorly clear to auscultation, no rales/rhonchi Abd: Soft, flat, nontender, bowel sounds normal Ext: No lower extremity edema.  Right upper arm AV fistula with intact dressings Labs: BMET Recent Labs  Lab 05/27/24 0452 05/27/24 1614 05/28/24 0454 05/29/24 9351 05/30/24 0207 05/31/24 0448 06/01/24 0553  06/02/24 0548  NA 132* 128* 131* 131* 130* 132* 134* 134*  K 3.6 4.3 4.0 3.7 4.7 3.9 4.7 5.6*  CL 93*  --  88* 90* 90* 90* 92* 91*  CO2 26  --  27 26 24 24 27 24   GLUCOSE 141*  --  143* 143* 178* 153* 156* 175*  BUN 38*  --  70* 40* 70* 47* 83* 114*  CREATININE 2.57*  --  4.06* 2.70* 3.93* 2.90* 4.70* 5.72*  CALCIUM  9.8  --  10.7* 11.0* 10.6* 10.6* 11.1* 11.3*  PHOS 3.4  --  6.0* 4.1 4.5 4.0  --   --    CBC Recent Labs  Lab 05/30/24 0207 05/31/24 0448 06/01/24 0553 06/02/24 0548  WBC 4.1 5.6 5.8 5.6  NEUTROABS  --  3.8 4.0 4.0  HGB 8.9* 9.0* 8.4* 8.7*  HCT 27.7* 28.2* 26.5* 27.2*  MCV 94.2 94.3 95.0 95.4  PLT 195 185 194 191      Medications:     amLODipine   10 mg Per Tube Once per day on Sunday Tuesday Thursday Saturday   arformoterol  15 mcg Nebulization BID   carvedilol   50 mg Per Tube BID WC   Chlorhexidine  Gluconate Cloth  6 each Topical Q0600   cloNIDine   0.2 mg Per Tube 3 times per day on Sunday Tuesday Thursday Saturday   cycloSPORINE   200 mg Per Tube QPM   cycloSPORINE   225 mg Per Tube Daily   darbepoetin (ARANESP ) injection - DIALYSIS  100 mcg Subcutaneous Q Wed-1800   feeding supplement (PROSource TF20)  60 mL Per Tube Daily   heparin  injection (subcutaneous)  5,000 Units  Subcutaneous Q12H   hydrALAZINE   75 mg Per Tube 3 times per day on Sunday Tuesday Thursday Saturday   insulin  aspart  0-6 Units Subcutaneous Q4H   lacosamide   100 mg Per Tube BID   mycophenolate   500 mg Per Tube BID   mouth rinse  15 mL Mouth Rinse 4 times per day   pantoprazole  (PROTONIX ) IV  40 mg Intravenous QHS   predniSONE   15 mg Per Tube Q breakfast    Myer Fret  MD  [facility] 06/02/2024, 10:21 AM

## 2024-06-02 NOTE — Progress Notes (Signed)
 SLP Cancellation Note  Patient Details Name: Sharmayne Jablon MRN: [id number] DOB: [birth year]   Cancelled treatment:       Reason Eval/Treat Not Completed: Patient's level of consciousness. Will sign off for SLP at this time. When pt is more stable and consistently alert can consider cognitive linguistic evaluation.    Juliah Scadden, Consuelo Fitch 06/02/2024, 10:19 AM

## 2024-06-02 NOTE — Progress Notes (Addendum)
 NAME:  [name] [name], MRN:  [id number], DOB:  [birth year], LOS: 14 ADMISSION DATE:  05/19/2024 CONSULTATION DATE:  9/15/20025 REFERRING MD: Patsey - EDP, CHIEF COMPLAINT:  Code Stroke, ICH   History of Present Illness:  43 year old woman who presented to [facility] ED 9/15 as a Code Stroke. 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 Recent admissions 6/11-7/28 and 7/29-8/13 for ICH.  Patient presented to [facility] ED from dialysis as a Code Stroke; reportedly LKW 1450 with development of obtundation and hemiparesis. SBP 200s. NIHSS 31 with initial leftward gaze, decreased LOC/disorientation, bilateral facial droop/arm weakness/leg weakness, global aphasia, dysarthria, visual neglect. CT Head demonstrated large volume IVH with moderate hydrocephalus, acute hemorrhage of medial R temporal lobe and small foci of residual/recurrent hemorrhage in the L cerebellar hemisphere.   Patient was seen by stroke team and neurosurgery, EVD was placed on left side, PCCM was consulted for medical evaluation and admission to neuro ICU  Pertinent Medical History:   Past Medical History:  Diagnosis Date   Anemia    Anxiety    DM (diabetes mellitus), type 1 (HCC)    ESRD on hemodialysis (HCC)    Gastroparesis    History of simultaneous kidney and pancreas transplant (HCC) 2014   Hypertension    Ischemic cerebrovascular accident (CVA) (HCC) 11/2021   Narcotic abuse (HCC)    Non compliance with medical treatment    Stroke [facility])    Vision loss    Significant Hospital Events: Including procedures, antibiotic start and stop dates in addition to other pertinent events   9/15 admitted with ICH plus IVH, EVD was placed for hydrocephalus 9/16 remain on clevidipine  infusion, tolerating spontaneous breathing trial, EVD is draining bloody fluid 9/17 continue to  require clevidipine  infusion, tolerated spontaneous breathing trial 9/18: No overnight issues. Currently off sedation. Still on high-dose `clevidipine  infusion 9/19: this am, dialysis ongoing. Neurosx plans to continue ventriculostomy for now. Remains on cleviprex  to maintain BP goal. Tmax 100.1. despite plt decrease 4T score 0. Will maintain heparin  as is for now and closely follow trend.  9/27 Spiking low grade fevers, wheezing on exam, CXR normal   Interim History / Subjective:   No new issues today.  Awaiting further neurosurgery input on EVD.  Family in the room I spoke with them and updated them.  Objective:  Blood pressure (!) 143/67, pulse 93, temperature 97.6 F (36.4 C), resp. rate (!) 22, height 5' 3 (1.6 m), weight 48.9 kg, SpO2 100%.        Intake/Output Summary (Last 24 hours) at 06/02/2024 1504 Last data filed at 06/02/2024 1400 Gross per 24 hour  Intake 1175 ml  Output 1993 ml  Net -818 ml   Filed Weights   05/30/24 0500 05/30/24 0730 05/31/24 0500  Weight: 49.3 kg 49.3 kg 48.9 kg   Physical Examination: General: Ill-appearing,  minimal responsiveness, did not open eyes to stimulation Chest: bilateral wheezing  Heart: Regular rate and rhythm, S1, S2 Extremities: Noted muscle wasting, limited movement on the right Neuro: Left-sided gaze preference, minimal movement of the right    Assessment & Plan:  Acute right parietotemporal intraparenchymal hemorrhage with IVH with ICH score 2 due to uncontrolled hypertension Hypertensive emergency Obstructive hydrocephalus status post EVD Acute left parietal ischemic stroke was ruled out Acute encephalopathy in the setting of ICH and hydrocephalus Acute respiratory insufficiency Serratia/klebsiella pneumonia End-stage renal disease on  hemodialysis with 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 Immunosuppression for pancreas kidney transplant  She had completed a 7 day course of antibiotics, resp culture growing serratia and kleb pneumo. Monitor fever curve for now. Low threshold to restart antibiotics. CXR has been clear. Blood cultures repeated today   -EVD per neurosurgery -EVD elevated on 9/25 to 30, repeat CT today with interval evolution with marginal improvement of extensive intraventricular hemorrhage, greatest in the right lateral ventricle and nearly resolved intraparenchymal hemorrhage along the ventriculostomy Catheter (reassuring findings). Per NSG: till has quite a bit of intraventricular blood on the right and mild hydrocephalus. Possible HTN and tachycardia unrelated to EVD drain. Plan to keep drain over the weekend   - Continue home Vimpat  - Continue Tylenol  as needed fever -Hold HTN meds on HD days except coreg  continue daily  -Brittle hemodynamics severe hypertension, with severe hypotension post HD - continue coreg  50BID, , hydralazine  100 q8hrs, reduced clonidine  to 0.2 TID instead of 0.3,amlodipine  10 daily.  -Continue tube feeds Nepro at goal, TF per PEG -SSI - Anti-rejection meds for pancreatic transplant as ordered - on subcu heparin  5 BID - No Foley, patient is anuric, but will do bladder scan to rule out retention.   35 minutes of critical care time spent in caring for this patient excluding procedures.  Tamela Stakes, MD  Attending Physician, Critical Care Medicine [facility] See Amion for pager If no response to pager, please call [phone] until 7pm After 7pm, Please call E-link [phone]

## 2024-06-03 LAB — GLUCOSE, CAPILLARY
Glucose-Capillary: 146 mg/dL — ABNORMAL HIGH (ref 70–99)
Glucose-Capillary: 169 mg/dL — ABNORMAL HIGH (ref 70–99)
Glucose-Capillary: 191 mg/dL — ABNORMAL HIGH (ref 70–99)
Glucose-Capillary: 275 mg/dL — ABNORMAL HIGH (ref 70–99)
Glucose-Capillary: 196 mg/dL — ABNORMAL HIGH (ref 70–99)
Glucose-Capillary: 167 mg/dL — ABNORMAL HIGH (ref 70–99)

## 2024-06-03 MED ORDER — CHLORHEXIDINE GLUCONATE CLOTH 2 % EX PADS
6.0000 | MEDICATED_PAD | Freq: Every day | CUTANEOUS | Status: DC
  Administered 2024-06-04 – 2024-06-05 (×2): 6 via TOPICAL

## 2024-06-03 MED ORDER — AMLODIPINE BESYLATE 5 MG PO TABS
5.0000 mg | ORAL_TABLET | Freq: Once | ORAL | Status: AC
  Administered 2024-06-03: 5 mg
  Filled 2024-06-03: qty 1

## 2024-06-03 NOTE — Progress Notes (Signed)
 eLink Physician-Brief Progress Note Patient Name: [name] [name] DOB: 03/10/1981 MRN: [id number]   Date of Service  06/03/2024  HPI/Events of Note  hypertensive despite labetalol (at 2 AM 20 mg) and hydralazine ( 3:02: 20 mg).   order from 9/23 to keep SBP < 160  Discussed with RN S/p dialysis. 175/63   l IVH w/ICH & mod obstructing hydrocephalus right temporal lobe , s/p EVD.    eICU Interventions  Amlodipine  5 mg  via  peg once.      Intervention Category Intermediate Interventions: Hypertension - evaluation and management  [name] [name] 06/03/2024, 5:14 AM

## 2024-06-03 NOTE — Progress Notes (Signed)
 Patient ID: [name] [name], female   DOB: May 28, 1981, 43 y.o.   MRN: [id number] [facility] Progress Note   Assessment/ Plan:   1.  Acute right intracranial hemorrhage: Associated with uncontrolled hypertension and status post ventriculostomy.  Extubated 9/22 and plans noted by neurosurgery to continue current strategy for ventriculostomy drainage with weaning effort this week. 2. ESRD following failure of renal allograft: Continue HD on MWF schedule via right BCF. Next HD Wed.  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 3. Anemia: Hemoglobin/hematocrit slightly improved with ongoing overt mild loss from ventriculostomy.  4. CKD-MBD: Elevated calcium  and phosphorus levels noted, continue to monitor on current tube feeds. Not on Ca supps, PTH pending.  5. Nutrition: Ongoing tube feeds, monitor electrolytes/volume status. 6. Hypertension: Blood pressure's very high, then dropped low and now is getting some meds (norvasc , clonidine , hydralazine ) on non HD days only, with coreg  25 mg bid daily. BP's higher today, may need to further adjust bp meds soon.   Subjective:   Not responsive   Objective:   BP 112/63   Pulse (!) 106   Temp 99.7 F (37.6 C) (Axillary)   Resp (!) 30   Ht 5' 3 (1.6 m)   Wt 46.3 kg   SpO2 100%   BMI 18.08 kg/m   Physical Exam: Gen: Comfortably resting in bed, unresponsive. Ventriculostomy drain in place CVS: Pulse regular tachycardia, S1 and S2 normal Resp: Anteriorly clear to auscultation, no rales/rhonchi Abd: Soft, flat, nontender, bowel sounds normal Ext: No lower extremity edema.  Right upper arm AV fistula with intact dressings  OP HD: [facility] MWF 3h  B400  55.2kg  2K bath   AVF  Heparin  none Mircera 150 mcg q 2 wks, due 9/24  Labs: BMET Recent Labs  Lab 05/27/24 1614 05/28/24 0454 05/29/24 0648  05/30/24 0207 05/31/24 0448 06/01/24 0553 06/02/24 0548  NA 128* 131* 131* 130* 132* 134* 134*  K 4.3 4.0 3.7 4.7 3.9 4.7 5.6*  CL  --  88* 90* 90* 90* 92* 91*  CO2  --  27 26 24 24 27 24   GLUCOSE  --  143* 143* 178* 153* 156* 175*  BUN  --  70* 40* 70* 47* 83* 114*  CREATININE  --  4.06* 2.70* 3.93* 2.90* 4.70* 5.72*  CALCIUM   --  10.7* 11.0* 10.6* 10.6* 11.1* 11.3*  PHOS  --  6.0* 4.1 4.5 4.0  --   --    CBC Recent Labs  Lab 05/30/24 0207 05/31/24 0448 06/01/24 0553 06/02/24 0548  WBC 4.1 5.6 5.8 5.6  NEUTROABS  --  3.8 4.0 4.0  HGB 8.9* 9.0* 8.4* 8.7*  HCT 27.7* 28.2* 26.5* 27.2*  MCV 94.2 94.3 95.0 95.4  PLT 195 185 194 191      Medications:     amLODipine   10 mg Per Tube Once per day on Sunday Tuesday Thursday Saturday   arformoterol  15 mcg Nebulization BID   carvedilol   50 mg Per Tube BID WC   Chlorhexidine  Gluconate Cloth  6 each Topical Q0600   cloNIDine   0.2 mg Per Tube 3 times per day on Sunday Tuesday Thursday Saturday   cycloSPORINE   200 mg Per Tube QPM   cycloSPORINE   225 mg Per Tube Daily   darbepoetin (ARANESP ) injection - DIALYSIS  100 mcg Subcutaneous Q Wed-1800   feeding supplement (PROSource TF20)  60 mL Per  Tube Daily   heparin  injection (subcutaneous)  5,000 Units Subcutaneous Q12H   hydrALAZINE   75 mg Per Tube 3 times per day on Sunday Tuesday Thursday Saturday   insulin  aspart  0-6 Units Subcutaneous Q4H   lacosamide   100 mg Per Tube BID   mycophenolate   500 mg Per Tube BID   mouth rinse  15 mL Mouth Rinse 4 times per day   pantoprazole  (PROTONIX ) IV  40 mg Intravenous QHS   predniSONE   15 mg Per Tube Q breakfast    Myer Fret  MD  [facility] 06/03/2024, 12:42 PM

## 2024-06-03 NOTE — Progress Notes (Signed)
 Subjective: The patient is without change.  Her mother is at the bedside.  Objective: Vital signs in last 24 hours: Temp:  [97.6 F (36.4 C)-99.9 F (37.7 C)] 99.7 F (37.6 C) (09/30 0400) Pulse Rate:  [92-112] 106 (09/30 0700) Resp:  [15-31] 25 (09/30 0700) BP: (117-175)/(60-84) 172/70 (09/30 0700) SpO2:  [100 %] 100 % (09/30 0700) Weight:  [46.3 kg] 46.3 kg (09/30 0500) Estimated body mass index is 18.08 kg/m as calculated from the following:   Height as of this encounter: 5' 3 (1.6 m).   Weight as of this encounter: 46.3 kg.   Intake/Output from previous day: 09/29 0701 - 09/30 0700 In: 1460 [NG/GT:1200] Out: 2245 [Drains:45; Stool:400] Intake/Output this shift: No intake/output data recorded.  Physical exam the patient opens her eyes.  She fidgets.  She does not follow commands.  She is without change.  Her ventriculostomy is patent and has not drained any significant amounts overnight at 30 cm of water .  Lab Results: Recent Labs    06/01/24 0553 06/02/24 0548  WBC 5.8 5.6  HGB 8.4* 8.7*  HCT 26.5* 27.2*  PLT 194 191   BMET Recent Labs    06/01/24 0553 06/02/24 0548  NA 134* 134*  K 4.7 5.6*  CL 92* 91*  CO2 27 24  GLUCOSE 156* 175*  BUN 83* 114*  CREATININE 4.70* 5.72*  CALCIUM  11.1* 11.3*    Studies/Results: No results found.  Assessment/Plan: Intraventricular hemorrhage, hydrocephalus, ventriculostomy: The patient seems to be tolerating raising the ventriculostomy without neurologic complaint.  We will likely remove it later today or tomorrow.  I have spoken with her mother.  LOS: 15 days     [name] [name] 06/03/2024, 7:58 AM     Patient ID: [name] [name], female   DOB: 1981/02/09, 43 y.o.   MRN: [id number]

## 2024-06-03 NOTE — TOC Progression Note (Signed)
 Transition of Care [facility]) - Progression Note    Patient Details  Name: [name] [name] MRN: [id number] Date of Birth: 1981/08/21  Transition of Care [facility]) CM/SW Contact  Inocente GORMAN Kindle, LCSW Phone Number: 06/03/2024, 9:44 AM  Clinical Narrative:    CSW continuing to follow for medical appropriateness to return to [facility].    Expected Discharge Plan: Skilled Nursing Facility Barriers to Discharge: Continued Medical Work up, English as a second language teacher               Expected Discharge Plan and Services In-house Referral: Clinical Social Work   Post Acute Care Choice: Skilled Nursing Facility Living arrangements for the past 2 months: Apartment                                       Social Drivers of Health (SDOH) Interventions SDOH Screenings   Food Insecurity: Patient Unable To Answer (05/20/2024)  Housing: Patient Unable To Answer (05/20/2024)  Transportation Needs: Patient Unable To Answer (05/20/2024)  Utilities: Patient Unable To Answer (05/20/2024)  Social Connections: Unknown (10/04/2022)   Received from [facility]  Stress: No Stress Concern Present (04/16/2024)   Received from [facility]  Tobacco Use: Medium Risk (04/27/2024)    Readmission Risk Interventions    05/20/2024    4:16 PM 03/31/2024   11:27 AM 11/17/2021   12:15 PM  Readmission Risk Prevention Plan  Transportation Screening Complete Complete Complete  Medication Review [employer]) Complete Complete Complete  PCP or Specialist appointment within 3-5 days of discharge Complete  Complete  HRI or Home Care Consult Complete Complete Complete  SW Recovery Care/Counseling Consult Complete Complete Complete  Palliative Care Screening Complete Not Applicable Not Applicable  Skilled Nursing Facility Complete Not Applicable Patient Refused

## 2024-06-03 NOTE — Progress Notes (Signed)
 NAME:  [name] [name], MRN:  [id number], DOB:  [birth year], LOS: 15 ADMISSION DATE:  05/19/2024 CONSULTATION DATE:  9/15/20025 REFERRING MD: Patsey - EDP, CHIEF COMPLAINT:  Code Stroke, ICH   History of Present Illness:  43 year old woman who presented to [facility] ED 9/15 as a Code Stroke. 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 Recent admissions 6/11-7/28 and 7/29-8/13 for ICH.  Patient presented to [facility] ED from dialysis as a Code Stroke; reportedly LKW 1450 with development of obtundation and hemiparesis. SBP 200s. NIHSS 31 with initial leftward gaze, decreased LOC/disorientation, bilateral facial droop/arm weakness/leg weakness, global aphasia, dysarthria, visual neglect. CT Head demonstrated large volume IVH with moderate hydrocephalus, acute hemorrhage of medial R temporal lobe and small foci of residual/recurrent hemorrhage in the L cerebellar hemisphere.   Patient was seen by stroke team and neurosurgery, EVD was placed on left side, PCCM was consulted for medical evaluation and admission to neuro ICU  Pertinent Medical History:   Past Medical History:  Diagnosis Date   Anemia    Anxiety    DM (diabetes mellitus), type 1 (HCC)    ESRD on hemodialysis (HCC)    Gastroparesis    History of simultaneous kidney and pancreas transplant (HCC) 2014   Hypertension    Ischemic cerebrovascular accident (CVA) (HCC) 11/2021   Narcotic abuse (HCC)    Non compliance with medical treatment    Stroke [facility])    Vision loss    Significant Hospital Events: Including procedures, antibiotic start and stop dates in addition to other pertinent events   9/15 admitted with ICH plus IVH, EVD was placed for hydrocephalus 9/16 remain on clevidipine  infusion, tolerating spontaneous breathing trial, EVD is draining bloody fluid 9/17 continue to  require clevidipine  infusion, tolerated spontaneous breathing trial 9/18: No overnight issues. Currently off sedation. Still on high-dose `clevidipine  infusion 9/19: this am, dialysis ongoing. Neurosx plans to continue ventriculostomy for now. Remains on cleviprex  to maintain BP goal. Tmax 100.1. despite plt decrease 4T score 0. Will maintain heparin  as is for now and closely follow trend.  9/27 Spiking low grade fevers, wheezing on exam, CXR normal   Interim History / Subjective:   Patient is sleeping comfortably.  Mother is in the room spoke with her.  Drain output has been decreasing.  Neurosurgery raised the drain and the patient is tolerating it.  Plans to remove the drain today or tomorrow noted.  Potassium is slightly high but the patient is due for dialysis tomorrow.  Objective:  Blood pressure (!) 153/71, pulse 92, temperature 97.9 F (36.6 C), temperature source Axillary, resp. rate 20, height 5' 3 (1.6 m), weight 46.3 kg, SpO2 100%.        Intake/Output Summary (Last 24 hours) at 06/03/2024 0844 Last data filed at 06/03/2024 0800 Gross per 24 hour  Intake 1435 ml  Output 2232 ml  Net -797 ml   Filed Weights   05/30/24 0730 05/31/24 0500 06/03/24 0500  Weight: 49.3 kg 48.9 kg 46.3 kg   Physical Examination: General: Ill-appearing,  minimal responsiveness, did not open eyes to stimulation Chest: bilateral wheezing  Heart: Regular rate and rhythm, S1, S2 Extremities: Noted muscle wasting, limited movement on the right Neuro: Left-sided gaze preference, minimal movement of the right    Assessment & Plan:  Acute right parietotemporal intraparenchymal hemorrhage with IVH with ICH score 2 due to uncontrolled hypertension Hypertensive  emergency Obstructive hydrocephalus status post EVD Acute left parietal ischemic stroke was ruled out Acute encephalopathy in the setting of ICH and hydrocephalus Acute respiratory insufficiency Serratia/klebsiella pneumonia End-stage renal  disease on hemodialysis with failed renal transplant but functional pancreatic transplant on immunosuppressive therapy Hypervolemic hyponatremia, hypochloremia  Type 1 diabetes, complicated with vasculopathy and gastroparesis- not on long term insulin  anymore Narcotic abuse Moderate protein calorie malnutrition Anemia, thrombocytopenia- am labs pending Stage 1 pressure ulcer POA #New onset low grade fevers without leukocytosis. Immunosuppression for pancreas kidney transplant  She had completed a 7 day course of antibiotics, resp culture growing serratia and kleb pneumo. Monitor fever curve for now. Low threshold to restart antibiotics. CXR has been clear. Blood cultures repeated today   -EVD per neurosurgery -EVD elevated on 9/25 to 30, repeat CT today with interval evolution with marginal improvement of extensive intraventricular hemorrhage, greatest in the right lateral ventricle and nearly resolved intraparenchymal hemorrhage along the ventriculostomy Catheter (reassuring findings). Per NSG: till has quite a bit of intraventricular blood on the right and mild hydrocephalus. Possible HTN and tachycardia unrelated to EVD drain. Plan to keep drain over the weekend   - Continue home Vimpat  - Continue Tylenol  as needed fever -Hold HTN meds on HD days except coreg  continue daily  -Brittle hemodynamics severe hypertension, with severe hypotension post HD - continue coreg  50BID, , hydralazine  100 q8hrs, reduced clonidine  to 0.2 TID instead of 0.3,amlodipine  10 daily.  -Continue tube feeds Nepro at goal, TF per PEG -SSI - Anti-rejection meds for pancreatic transplant as ordered - on subcu heparin  5 BID - No Foley, patient is anuric, but will do bladder scan to rule out retention.   35 minutes of critical care time spent in caring for this patient excluding procedures.  Tamela Stakes, MD  Attending Physician, Critical Care Medicine [place] [facility] See Amion for pager If  no response to pager, please call [phone] until 7pm After 7pm, Please call E-link [phone]

## 2024-06-04 ENCOUNTER — Inpatient Hospital Stay (HOSPITAL_COMMUNITY)

## 2024-06-04 LAB — COMPREHENSIVE METABOLIC PANEL WITH GFR
ALT: 12 U/L (ref 0–44)
AST: 22 U/L (ref 15–41)
Albumin: 3.3 g/dL — ABNORMAL LOW (ref 3.5–5.0)
Alkaline Phosphatase: 79 U/L (ref 38–126)
Anion gap: 16 — ABNORMAL HIGH (ref 5–15)
BUN: 50 mg/dL — ABNORMAL HIGH (ref 6–20)
CO2: 25 mmol/L (ref 22–32)
Calcium: 10.6 mg/dL — ABNORMAL HIGH (ref 8.9–10.3)
Chloride: 90 mmol/L — ABNORMAL LOW (ref 98–111)
Creatinine, Ser: 2.64 mg/dL — ABNORMAL HIGH (ref 0.44–1.00)
GFR, Estimated: 23 mL/min — ABNORMAL LOW (ref >60)
Glucose, Bld: 198 mg/dL — ABNORMAL HIGH (ref 70–99)
Potassium: 4.4 mmol/L (ref 3.5–5.1)
Sodium: 131 mmol/L — ABNORMAL LOW (ref 135–145)
Total Bilirubin: 0.5 mg/dL (ref 0.0–1.2)
Total Protein: 7 g/dL (ref 6.5–8.1)

## 2024-06-04 LAB — GLUCOSE, CAPILLARY
Glucose-Capillary: 128 mg/dL — ABNORMAL HIGH (ref 70–99)
Glucose-Capillary: 153 mg/dL — ABNORMAL HIGH (ref 70–99)
Glucose-Capillary: 165 mg/dL — ABNORMAL HIGH (ref 70–99)
Glucose-Capillary: 171 mg/dL — ABNORMAL HIGH (ref 70–99)
Glucose-Capillary: 172 mg/dL — ABNORMAL HIGH (ref 70–99)
Glucose-Capillary: 187 mg/dL — ABNORMAL HIGH (ref 70–99)

## 2024-06-04 LAB — LACTIC ACID, PLASMA
Lactic Acid, Venous: 3 mmol/L (ref 0.5–1.9)
Lactic Acid, Venous: 2.2 mmol/L (ref 0.5–1.9)

## 2024-06-04 MED ORDER — BANATROL TF EN LIQD
60.0000 mL | Freq: Two times a day (BID) | ENTERAL | Status: DC
  Administered 2024-06-04 – 2024-06-05 (×3): 60 mL
  Filled 2024-06-04 (×3): qty 60

## 2024-06-04 MED ORDER — LIDOCAINE-PRILOCAINE 2.5-2.5 % EX CREA: 1.0000 | TOPICAL_CREAM | CUTANEOUS | Status: DC | PRN

## 2024-06-04 MED ORDER — LACTATED RINGERS IV BOLUS: 500.0000 mL | Freq: Once | INTRAVENOUS | Status: DC

## 2024-06-04 MED ORDER — SODIUM CHLORIDE 0.9 % IV BOLUS: 250.0000 mL | Freq: Once | INTRAVENOUS | Status: DC

## 2024-06-04 MED ORDER — VANCOMYCIN HCL 500 MG/100ML IV SOLN
500.0000 mg | INTRAVENOUS | Status: AC
  Administered 2024-06-06: 500 mg via INTRAVENOUS
  Filled 2024-06-04: qty 100

## 2024-06-04 MED ORDER — VANCOMYCIN HCL IN DEXTROSE 1-5 GM/200ML-% IV SOLN
1000.0000 mg | Freq: Once | INTRAVENOUS | Status: AC
Start: 2024-06-04 — End: 2024-06-04
  Administered 2024-06-04: 1000 mg via INTRAVENOUS
  Filled 2024-06-04: qty 200

## 2024-06-04 MED ORDER — SODIUM CHLORIDE 0.9 % IV SOLN
2.0000 g | INTRAVENOUS | Status: DC
  Administered 2024-06-04: 2 g via INTRAVENOUS
  Filled 2024-06-04: qty 12.5

## 2024-06-04 MED ORDER — ALBUMIN HUMAN 25 % IV SOLN
25.0000 g | Freq: Once | INTRAVENOUS | Status: AC
  Administered 2024-06-04: 12.5 g via INTRAVENOUS
  Filled 2024-06-04: qty 100

## 2024-06-04 MED ORDER — ADULT MULTIVITAMIN LIQUID CH
15.0000 mL | Freq: Every day | ORAL | Status: DC
  Administered 2024-06-04 – 2024-06-12 (×9): 15 mL
  Filled 2024-06-04 (×10): qty 15

## 2024-06-04 MED ORDER — MIDODRINE HCL 5 MG PO TABS: 10.0000 mg | ORAL_TABLET | Freq: Three times a day (TID) | ORAL | Status: DC

## 2024-06-04 MED ORDER — OSMOLITE 1.5 CAL PO LIQD
1000.0000 mL | ORAL | Status: DC
  Administered 2024-06-04 – 2024-06-05 (×2): 1000 mL

## 2024-06-04 MED ORDER — HYDRALAZINE HCL 50 MG PO TABS
50.0000 mg | ORAL_TABLET | Freq: Once | ORAL | Status: AC
  Administered 2024-06-04: 50 mg
  Filled 2024-06-04: qty 1

## 2024-06-04 MED ORDER — HEPARIN SODIUM (PORCINE) 1000 UNIT/ML DIALYSIS: 1000.0000 [IU] | INTRAMUSCULAR | Status: DC | PRN

## 2024-06-04 MED ORDER — PENTAFLUOROPROP-TETRAFLUOROETH EX AERO: 1.0000 | INHALATION_SPRAY | CUTANEOUS | Status: DC | PRN

## 2024-06-04 MED ORDER — SODIUM CHLORIDE 0.9 % IV BOLUS
250.0000 mL | Freq: Once | INTRAVENOUS | Status: AC
  Administered 2024-06-04: 250 mL via INTRAVENOUS

## 2024-06-04 MED ORDER — MIDODRINE HCL 5 MG PO TABS
10.0000 mg | ORAL_TABLET | Freq: Three times a day (TID) | ORAL | Status: DC
  Administered 2024-06-04: 10 mg
  Filled 2024-06-04: qty 2

## 2024-06-04 MED ORDER — ANTICOAGULANT SODIUM CITRATE 4% (200MG/5ML) IV SOLN: 5.0000 mL | Status: DC | PRN

## 2024-06-04 MED ORDER — LIDOCAINE HCL (PF) 1 % IJ SOLN: 5.0000 mL | INTRAMUSCULAR | Status: DC | PRN

## 2024-06-04 MED ORDER — NEPRO/CARBSTEADY PO LIQD: 237.0000 mL | ORAL | Status: DC | PRN

## 2024-06-04 NOTE — Progress Notes (Signed)
 Patient ID: [name], female   DOB: [birth year], 43 y.o.   MRN: [id number] [facility] Progress Note   Subjective:   Not responsive   Objective:   BP (!) 128/54   Pulse (!) 116   Temp 98.6 F (37 C)   Resp (!) 28   Ht 5' 3 (1.6 m)   Wt 45 kg   SpO2 99%   BMI 17.57 kg/m   Physical Exam: Gen: Comfortably resting in bed, unresponsive. +Ventriculostomy drain CVS: Pulse regular tachycardia, S1 and S2 normal Resp: Anteriorly clear to auscultation, no rales/rhonchi Abd: Soft, flat, nontender, bowel sounds normal Ext: No lower extremity edema.  Right upper arm AV fistula with intact dressings  OP HD: NW MWF 3h  B400  55.2kg  2K bath   AVF  Heparin  none Mircera 150 mcg q 2 wks, due 9/24    Assessment/ Plan:   1.  Acute right intracranial hemorrhage: Associated with uncontrolled hypertension and status post ventriculostomy.  Extubated 9/22 and plans noted by neurosurgery to continue current strategy for ventriculostomy drainage with weaning effort this week. 2. ESRD following failure of renal allograft: Continue HD on MWF schedule via right BCF. Next HD today.  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 3. Anemia: Hemoglobin/hematocrit slightly improved with ongoing overt mild loss from ventriculostomy.  4. CKD-MBD: Elevated calcium  and phosphorus levels noted, continue to monitor on current tube feeds. Not on Ca supps, PTH pending.  5. Nutrition: Ongoing tube feeds, monitor electrolytes/volume status. 6. Hypertension: Blood pressure's very high, then dropped low and now is getting some meds (norvasc , clonidine , hydralazine ) on non HD days only, with coreg  25 mg bid daily.    Myer Fret  MD  [facility] 06/04/2024, 12:48 PM  Recent Labs  Lab 05/30/24 0207 05/31/24 0448 06/01/24 0553 06/02/24 0548  HGB 8.9* 9.0* 8.4* 8.7*   CALCIUM  10.6* 10.6* 11.1* 11.3*  PHOS 4.5 4.0  --   --   CREATININE 3.93* 2.90* 4.70* 5.72*  K 4.7 3.9 4.7 5.6*    Inpatient medications:  amLODipine   10 mg Per Tube Once per day on Sunday Tuesday Thursday Saturday   arformoterol  15 mcg Nebulization BID   carvedilol   50 mg Per Tube BID WC   Chlorhexidine  Gluconate Cloth  6 each Topical Q0600   cloNIDine   0.2 mg Per Tube 3 times per day on Sunday Tuesday Thursday Saturday   cycloSPORINE   200 mg Per Tube QPM   cycloSPORINE   225 mg Per Tube Daily   darbepoetin (ARANESP ) injection - DIALYSIS  100 mcg Subcutaneous Q Wed-1800   feeding supplement (PROSource TF20)  60 mL Per Tube Daily   heparin  injection (subcutaneous)  5,000 Units Subcutaneous Q12H   hydrALAZINE   75 mg Per Tube 3 times per day on Sunday Tuesday Thursday Saturday   insulin  aspart  0-6 Units Subcutaneous Q4H   lacosamide   100 mg Per Tube BID   mycophenolate   500 mg Per Tube BID   mouth rinse  15 mL Mouth Rinse 4 times per day   pantoprazole  (PROTONIX ) IV  40 mg Intravenous QHS   predniSONE   15 mg Per Tube Q breakfast    anticoagulant sodium citrate      feeding supplement (OSMOLITE 1.5 CAL) 50 mL/hr at 06/04/24 1200   acetaminophen  **OR** acetaminophen  (TYLENOL ) oral liquid 160 mg/5 mL **OR** acetaminophen , alteplase , anticoagulant sodium citrate , feeding supplement (NEPRO  CARB STEADY), heparin , hydrALAZINE , labetalol , lidocaine  (PF), lidocaine -prilocaine , mouth rinse, pentafluoroprop-tetrafluoroeth, sennosides

## 2024-06-04 NOTE — Progress Notes (Signed)
 NAME:  Emmett Arntz, MRN:  [id number], DOB:  1981/06/27, LOS: 16 ADMISSION DATE:  05/19/2024 CONSULTATION DATE:  9/15/20025 REFERRING MD: Patsey - EDP, CHIEF COMPLAINT:  Code Stroke, ICH   History of Present Illness:  43 year old woman who presented to [facility] ED 9/15 as a Code Stroke. 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 Recent admissions 6/11-7/28 and 7/29-8/13 for ICH.  Patient presented to [facility] ED from dialysis as a Code Stroke; reportedly LKW 1450 with development of obtundation and hemiparesis. SBP 200s. NIHSS 31 with initial leftward gaze, decreased LOC/disorientation, bilateral facial droop/arm weakness/leg weakness, global aphasia, dysarthria, visual neglect. CT Head demonstrated large volume IVH with moderate hydrocephalus, acute hemorrhage of medial R temporal lobe and small foci of residual/recurrent hemorrhage in the L cerebellar hemisphere.   Patient was seen by stroke team and neurosurgery, EVD was placed on left side, PCCM was consulted for medical evaluation and admission to neuro ICU  Pertinent Medical History:   Past Medical History:  Diagnosis Date   Anemia    Anxiety    DM (diabetes mellitus), type 1 (HCC)    ESRD on hemodialysis (HCC)    Gastroparesis    History of simultaneous kidney and pancreas transplant (HCC) 2014   Hypertension    Ischemic cerebrovascular accident (CVA) (HCC) 11/2021   Narcotic abuse (HCC)    Non compliance with medical treatment    Stroke [facility])    Vision loss    Significant Hospital Events: Including procedures, antibiotic start and stop dates in addition to other pertinent events   9/15 admitted with ICH plus IVH, EVD was placed for hydrocephalus 9/16 remain on clevidipine  infusion, tolerating spontaneous breathing trial, EVD is draining bloody fluid 9/17 continue to  require clevidipine  infusion, tolerated spontaneous breathing trial 9/18: No overnight issues. Currently off sedation. Still on high-dose `clevidipine  infusion 9/19: this am, dialysis ongoing. Neurosx plans to continue ventriculostomy for now. Remains on cleviprex  to maintain BP goal. Tmax 100.1. despite plt decrease 4T score 0. Will maintain heparin  as is for now and closely follow trend.  9/27 Spiking low grade fevers, wheezing on exam, CXR normal  9/30 No tolerating when EVD is raised. Still bloody fluid. CT head will be done.  Interim History / Subjective:   Patient is more drowsy than normal.  She is not tolerating elevated EVD.  She still has bloody fluid.  Almost 10 cc/h.  Neurosurgery has put it down to 10 cm.  At this stage they are thinking they might have to continue prolonged EVD drain or maybe even consider VP shunt.  Objective:  Blood pressure (!) 116/55, pulse (!) 121, temperature 98.6 F (37 C), resp. rate (!) 35, height 5' 3 (1.6 m), weight 45 kg, SpO2 99%.        Intake/Output Summary (Last 24 hours) at 06/04/2024 1549 Last data filed at 06/04/2024 1500 Gross per 24 hour  Intake 1260 ml  Output 2569 ml  Net -1309 ml   Filed Weights   06/04/24 0500 06/04/24 0840 06/04/24 1237  Weight: 47.4 kg 47.4 kg 45 kg   Physical Examination: General: Ill-appearing,  minimal responsiveness, did not open eyes to stimulation Chest: Clear lungs bilaterally Heart: Regular rate and rhythm, S1, S2 Extremities: Noted muscle wasting, limited movement on the right Neuro: Left-sided gaze preference, minimal movement of the right    Assessment & Plan:  Acute right  parietotemporal intraparenchymal hemorrhage with IVH with ICH score 2 due to uncontrolled hypertension Hypertensive emergency Obstructive hydrocephalus status post EVD Acute left parietal ischemic stroke was ruled out Acute encephalopathy in the setting of ICH and hydrocephalus Acute respiratory  insufficiency Serratia/klebsiella pneumonia End-stage renal disease on hemodialysis with failed renal transplant but functional pancreatic transplant on immunosuppressive therapy Hypervolemic hyponatremia, hypochloremia  Type 1 diabetes, complicated with vasculopathy and gastroparesis- not on long term insulin  anymore Narcotic abuse Moderate protein calorie malnutrition Anemia, thrombocytopenia- am labs pending Stage 1 pressure ulcer POA #New onset low grade fevers without leukocytosis. Immunosuppression for pancreas kidney transplant  She had completed a 7 day course of antibiotics, resp culture growing serratia and kleb pneumo. Monitor fever curve for now. Low threshold to restart antibiotics. CXR has been clear. Blood cultures repeated today   -EVD per neurosurgery - Patient is not tolerating EVD elevation at this stage it is put back to 10 cm -Repeat CT head today on 06/04/2024 shows no new issues - Continue home Vimpat  - Continue Tylenol  as needed fever -Hold HTN meds on HD days except coreg  continue daily  -Brittle hemodynamics severe hypertension, with severe hypotension post HD - continue coreg  50BID, hydralazine  100 q8hrs, reduced clonidine  to 0.2 TID instead of 0.3,amlodipine  10 daily.  -Continue tube feeds Nepro at goal, TF per PEG -SSI - Anti-rejection meds for pancreatic transplant as ordered - on subcu heparin  5 BID - No Foley, patient is anuric, but will do bladder scan to rule out retention.   35 minutes of critical care time spent in caring for this patient excluding procedures.  Tamela Stakes, MD  Attending Physician, Critical Care Medicine [place] [facility] Critical [facility] See Amion for pager If no response to pager, please call [phone] until 7pm After 7pm, Please call E-link [phone]

## 2024-06-04 NOTE — Progress Notes (Signed)
 Nutrition Follow-up  DOCUMENTATION CODES:   Underweight, Non-severe (moderate) malnutrition in context of chronic illness  INTERVENTION:  Continue Tube feeding via J-tube: Osmolite 1.5 at 55 ml/h (1320 ml per day) Prosource TF20 60 ml daily   Provides 2060 kcal, 102 gm protein, 1005 ml free water  daily   Continue liquid MVI  Add Banatrol BID to help with loose stools   NUTRITION DIAGNOSIS:   Moderate Malnutrition related to chronic illness (ESRD on HD/DM and gastroparesis) as evidenced by severe muscle depletion, mild fat depletion. - Ongoing   GOAL:   Patient will meet greater than or equal to 90% of their needs - Progressing   MONITOR:   Diet advancement, Vent status, Labs, I & O's, TF tolerance  REASON FOR ASSESSMENT:   Consult Enteral/tube feeding initiation and management  ASSESSMENT:   43 year old female who presented as a Code Stroke with IVH. 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 Recent admissions 6/11-7/28 and 7/29-8/13 for ICH with GJ tube.  6/11 - 7/29: Hospital admission, discharge to [facility] 8/12 - G tube converted to GJ  9/16 - Admitted, EVD placed, intubated 9/21 - switched to Nepro formula; FMS placed 9/22 - Extubated  9/23 - adjusted rate of Nepro to meet needs (was previously exceeding) 9/25 - D/C Nepro, Started Osmolite 1.5   Per RN pt declined today, very lethargic, still able to move extremities, not following commands. Fidgeting. Pt receiving in room HD on visit. Did not open eyes to voice. No family bedside but pt's mom from [place] frequently visits. EVD output was decreasing EVD raised, pt tolerating. May remove Drain in the next coming days per neuro.   Still having liquid BM with FMS. No UOP.   Repeat exam still shows severe muscle wasting and mild fat wasting. Weight  appears to be decreasing, pt now under EDW of 55.2 kg. Increase TF rate.   Temp (24hrs), Avg:99.5 F (37.5 C), Min:98.6 F (37 C), Max:100.7 F (38.2 C)  Admit weight: 50.4 kg Current weight: 50.4 kg EDW: 55.2 kg   Intake/Output Summary (Last 24 hours) at 06/04/2024 1514 Last data filed at 06/04/2024 1500 Gross per 24 hour  Intake 1260 ml  Output 2569 ml  Net -1309 ml   Net IO Since Admission: 2,413.02 mL [06/04/24 1514]  Drains/Lines: FMS - Nothing documented - loose per RN  EVD - 45 ml x 24 hours, raised  GJ tube   Average Meal Intake: NPO  Nutritionally Relevant Medications: Scheduled Meds:  feeding supplement (PROSource TF20)  60 mL Per Tube Daily   insulin  aspart  0-6 Units Subcutaneous Q4H   lacosamide   100 mg Per Tube BID   Continuous Infusions:  anticoagulant sodium citrate      feeding supplement (OSMOLITE 1.5 CAL) 50 mL/hr at 06/04/24 1500   Labs Reviewed: No recent labs today  CBG ranges from 146-275 mg/dL over the last 24 hours HgbA1c 3.6  NUTRITION - FOCUSED PHYSICAL EXAM:  Flowsheet Row Most Recent Value  Orbital Region Mild depletion  Upper Arm Region Mild depletion  Thoracic and Lumbar Region Moderate depletion  Buccal Region No depletion  [fluid]  Temple Region Mild depletion  Clavicle Bone Region Mild depletion  Clavicle and Acromion Bone Region Mild depletion  Scapular Bone Region Mild depletion  Dorsal Hand Mild depletion  Patellar Region Severe depletion  Anterior Thigh Region Severe depletion  Posterior  Calf Region Severe depletion  Edema (RD Assessment) Mild  [Mostly upper body/face]  Hair Reviewed  Eyes Unable to assess  Mouth Unable to assess  Skin Reviewed  Nails Reviewed    Diet Order:   Diet Order             Diet NPO time specified  Diet effective now                   EDUCATION NEEDS:   Not appropriate for education at this time  Skin:  Skin Assessment: Skin Integrity Issues: Skin Integrity Issues:: Stage  I Stage I: buttocks  Last BM:  10/1 no output charted via FMS, type 7  Height:   Ht Readings from Last 1 Encounters:  05/21/24 5' 3 (1.6 m)    Weight:   Wt Readings from Last 1 Encounters:  06/04/24 45 kg    BMI:  Body mass index is 17.57 kg/m.  Estimated Nutritional Needs:   Kcal:  1700-1900  Protein:  80-100 grams  Fluid:  1L +UOP   Olivia Kenning, RD Registered Dietitian  See Amion for more information

## 2024-06-04 NOTE — Progress Notes (Signed)
 Pharmacy Antibiotic Note  [name] [name] is a 43 y.o. female admitted on 05/19/2024 with IVH / ICH and PNA.  Patient completed a course of antibiotics for Serratia and Klebsiella PNA.  Now with sepsis and Pharmacy has been consulted for vancomycin  and Cefepime  dosing.  Patient has ESRD on MWF HD - tolerated HD today 10/1.  Tmax 101.3, WBC was WNL.  Plan: Vanc 1gm IV x 1, then 500mg  IV qHD MWF Cefepime  2gm IV qHD MWF Monitor HD schedule/tolerance, micro data, vanc levels as indicated   Height: 5' 3 (160 cm) Weight: 45 kg (99 lb 3.3 oz) IBW/kg (Calculated) : 52.4  Temp (24hrs), Avg:99.6 F (37.6 C), Min:98.6 F (37 C), Max:101.3 F (38.5 C)  Recent Labs  Lab 05/29/24 0648 05/30/24 0207 05/31/24 0448 06/01/24 0553 06/02/24 0548  WBC 3.9* 4.1 5.6 5.8 5.6  CREATININE 2.70* 3.93* 2.90* 4.70* 5.72*    Estimated Creatinine Clearance: 9.1 mL/min (A) (by C-G formula based on SCr of 5.72 mg/dL (H)).    No Known Allergies  Zasha Belleau D. [place], PharmD, BCPS, BCCCP 06/04/2024, 5:40 PM

## 2024-06-04 NOTE — Progress Notes (Signed)
   06/04/24 1715  Vitals  Temp (!) 102.3 F (39.1 C)  Temp Source Axillary  BP (!) 83/49  MAP (mmHg) (!) 61  BP Location Left Arm  BP Method Automatic  Patient Position (if appropriate) Lying  Pulse Rate (!) 108  Pulse Rate Source Monitor  ECG Heart Rate (!) 108  Resp (!) 43  Oxygen  Therapy  SpO2 100 %   Vital signs communicated to [facility] Wilburn, MD and [place], [place]. Patient extremely diaphoretic, soaking through gown. Maintaining airway adequately. + cough, gag. Neuro exam unchanged.   New orders placed by CCM.  06/04/2024 Charletta Bathe, RN, BSN 6:42 PM

## 2024-06-04 NOTE — Progress Notes (Signed)
 eLink Physician-Brief Progress Note Patient Name: [name] [name] DOB: [birth year] MRN: [id number]   Date of Service  06/04/2024  HPI/Events of Note  bp 170s, goal <160 got everything she has ordered  eICU Interventions  Additional dose of enteral hydralazine      Intervention Category Intermediate Interventions: Hypertension - evaluation and management  Jacquelina Hewins 06/04/2024, 4:02 AM

## 2024-06-04 NOTE — Progress Notes (Signed)
 Subjective: The patient is being dialyzed.  She is a bit more sleepy today.  Objective: Vital signs in last 24 hours: Temp:  [98.6 F (37 C)-100.7 F (38.2 C)] 98.6 F (37 C) (10/01 1237) Pulse Rate:  [92-119] 118 (10/01 1315) Resp:  [14-39] 27 (10/01 1315) BP: (84-171)/(49-81) 99/50 (10/01 1315) SpO2:  [96 %-100 %] 100 % (10/01 1315) Weight:  [45 kg-47.4 kg] 45 kg (10/01 1237) Estimated body mass index is 17.57 kg/m as calculated from the following:   Height as of this encounter: 5' 3 (1.6 m).   Weight as of this encounter: 45 kg.   Intake/Output from previous day: 09/30 0701 - 10/01 0700 In: 1200 [NG/GT:1200] Out: 59 [Drains:59] Intake/Output this shift: Total I/O In: 410 [Other:60; NG/GT:350] Out: 2501 [Drains:101; Other:2400]  Physical exam patient is a bit more sleepy today but will open eyes to painful stimuli.  Her ventriculostomy is draining at 30 cm.  Lab Results: Recent Labs    06/02/24 0548  WBC 5.6  HGB 8.7*  HCT 27.2*  PLT 191   BMET Recent Labs    06/02/24 0548  NA 134*  K 5.6*  CL 91*  CO2 24  GLUCOSE 175*  BUN 114*  CREATININE 5.72*  CALCIUM  11.3*    Studies/Results: No results found.  Assessment/Plan: Interventricular hemorrhage, hydrocephalus, ventriculostomy: It looks like she is going to need her ventriculostomy a bit longer and possibly a VP shunt.  Her CSF is too bloody for shunt placement presently.  We will lower the ventriculostomy back to 10 cm of water .  LOS: 16 days     [name] [name] 06/04/2024, 2:32 PM     Patient ID: [name] [name], female   DOB: 08-13-1981, 43 y.o.   MRN: [id number]

## 2024-06-04 NOTE — Progress Notes (Signed)
 Tx done t bedside---4N20 No change in neurological baseline Informed consent signed and in chart.   TX duration:3.155  Patient tolerated well.   No acute distress.  Hand-off given to patient's nurse.   Access used: RUAF Access issues: no complicatiions  Total UF removed: 2400 Medication(s) given: none [name] [name] [facility]

## 2024-06-05 DIAGNOSIS — Z7189 Other specified counseling: Secondary | ICD-10-CM | POA: Diagnosis not present

## 2024-06-05 DIAGNOSIS — E440 Moderate protein-calorie malnutrition: Secondary | ICD-10-CM | POA: Diagnosis not present

## 2024-06-05 DIAGNOSIS — Z515 Encounter for palliative care: Secondary | ICD-10-CM | POA: Diagnosis not present

## 2024-06-05 DIAGNOSIS — I619 Nontraumatic intracerebral hemorrhage, unspecified: Secondary | ICD-10-CM | POA: Diagnosis not present

## 2024-06-05 LAB — CBC
HCT: 25.9 % — ABNORMAL LOW (ref 36.0–46.0)
Hemoglobin: 8.3 g/dL — ABNORMAL LOW (ref 12.0–15.0)
MCH: 30.9 pg (ref 26.0–34.0)
MCHC: 32 g/dL (ref 30.0–36.0)
MCV: 96.3 fL (ref 80.0–100.0)
Platelets: 171 K/uL (ref 150–400)
RBC: 2.69 MIL/uL — ABNORMAL LOW (ref 3.87–5.11)
RDW: 15.5 % (ref 11.5–15.5)
WBC: 4.3 K/uL (ref 4.0–10.5)
nRBC: 0 % (ref 0.0–0.2)

## 2024-06-05 LAB — GLUCOSE, CAPILLARY
Glucose-Capillary: 147 mg/dL — ABNORMAL HIGH (ref 70–99)
Glucose-Capillary: 159 mg/dL — ABNORMAL HIGH (ref 70–99)
Glucose-Capillary: 165 mg/dL — ABNORMAL HIGH (ref 70–99)
Glucose-Capillary: 177 mg/dL — ABNORMAL HIGH (ref 70–99)
Glucose-Capillary: 206 mg/dL — ABNORMAL HIGH (ref 70–99)
Glucose-Capillary: 220 mg/dL — ABNORMAL HIGH (ref 70–99)

## 2024-06-05 LAB — COMPREHENSIVE METABOLIC PANEL WITH GFR
ALT: 12 U/L (ref 0–44)
AST: 17 U/L (ref 15–41)
Albumin: 3.4 g/dL — ABNORMAL LOW (ref 3.5–5.0)
Alkaline Phosphatase: 83 U/L (ref 38–126)
Anion gap: 18 — ABNORMAL HIGH (ref 5–15)
BUN: 71 mg/dL — ABNORMAL HIGH (ref 6–20)
CO2: 23 mmol/L (ref 22–32)
Calcium: 11.3 mg/dL — ABNORMAL HIGH (ref 8.9–10.3)
Chloride: 89 mmol/L — ABNORMAL LOW (ref 98–111)
Creatinine, Ser: 3.44 mg/dL — ABNORMAL HIGH (ref 0.44–1.00)
GFR, Estimated: 16 mL/min — ABNORMAL LOW (ref >60)
Glucose, Bld: 138 mg/dL — ABNORMAL HIGH (ref 70–99)
Potassium: 5.7 mmol/L — ABNORMAL HIGH (ref 3.5–5.1)
Sodium: 130 mmol/L — ABNORMAL LOW (ref 135–145)
Total Bilirubin: 0.7 mg/dL (ref 0.0–1.2)
Total Protein: 6.6 g/dL (ref 6.5–8.1)

## 2024-06-05 LAB — CULTURE, BLOOD (ROUTINE X 2): Special Requests: ADEQUATE

## 2024-06-05 MED ORDER — INSULIN ASPART 100 UNIT/ML IJ SOLN
0.0000 [IU] | INTRAMUSCULAR | Status: DC
  Administered 2024-06-05: 2 [IU] via SUBCUTANEOUS
  Administered 2024-06-05: 3 [IU] via SUBCUTANEOUS
  Administered 2024-06-06 (×2): 1 [IU] via SUBCUTANEOUS
  Administered 2024-06-06 (×3): 2 [IU] via SUBCUTANEOUS
  Administered 2024-06-06: 3 [IU] via SUBCUTANEOUS
  Administered 2024-06-06: 2 [IU] via SUBCUTANEOUS
  Administered 2024-06-07 (×2): 1 [IU] via SUBCUTANEOUS
  Administered 2024-06-07: 3 [IU] via SUBCUTANEOUS
  Administered 2024-06-07 (×2): 2 [IU] via SUBCUTANEOUS
  Administered 2024-06-08 (×3): 1 [IU] via SUBCUTANEOUS
  Administered 2024-06-08: 3 [IU] via SUBCUTANEOUS
  Administered 2024-06-08: 2 [IU] via SUBCUTANEOUS
  Administered 2024-06-08: 1 [IU] via SUBCUTANEOUS
  Administered 2024-06-08 – 2024-06-09 (×5): 2 [IU] via SUBCUTANEOUS
  Administered 2024-06-10: 1 [IU] via SUBCUTANEOUS
  Administered 2024-06-10 (×4): 2 [IU] via SUBCUTANEOUS
  Administered 2024-06-11: 1 [IU] via SUBCUTANEOUS
  Administered 2024-06-11 – 2024-06-12 (×5): 2 [IU] via SUBCUTANEOUS
  Administered 2024-06-12 – 2024-06-13 (×3): 1 [IU] via SUBCUTANEOUS
  Administered 2024-06-13 (×2): 3 [IU] via SUBCUTANEOUS
  Administered 2024-06-13: 1 [IU] via SUBCUTANEOUS
  Administered 2024-06-14: 2 [IU] via SUBCUTANEOUS
  Administered 2024-06-14 (×2): 1 [IU] via SUBCUTANEOUS
  Administered 2024-06-14: 2 [IU] via SUBCUTANEOUS
  Administered 2024-06-14: 3 [IU] via SUBCUTANEOUS
  Administered 2024-06-14 – 2024-06-15 (×2): 1 [IU] via SUBCUTANEOUS
  Administered 2024-06-15: 3 [IU] via SUBCUTANEOUS
  Administered 2024-06-15: 2 [IU] via SUBCUTANEOUS
  Administered 2024-06-15: 1 [IU] via SUBCUTANEOUS
  Administered 2024-06-15: 2 [IU] via SUBCUTANEOUS
  Administered 2024-06-16: 1 [IU] via SUBCUTANEOUS
  Administered 2024-06-16: 2 [IU] via SUBCUTANEOUS
  Administered 2024-06-16 (×3): 1 [IU] via SUBCUTANEOUS
  Administered 2024-06-16: 5 [IU] via SUBCUTANEOUS
  Administered 2024-06-17: 1 [IU] via SUBCUTANEOUS
  Administered 2024-06-17: 3 [IU] via SUBCUTANEOUS
  Administered 2024-06-17 – 2024-06-19 (×6): 1 [IU] via SUBCUTANEOUS
  Administered 2024-06-19: 2 [IU] via SUBCUTANEOUS
  Administered 2024-06-19: 1 [IU] via SUBCUTANEOUS
  Administered 2024-06-19: 2 [IU] via SUBCUTANEOUS
  Administered 2024-06-19: 1 [IU] via SUBCUTANEOUS
  Administered 2024-06-19: 2 [IU] via SUBCUTANEOUS
  Administered 2024-06-19: 1 [IU] via SUBCUTANEOUS
  Administered 2024-06-20: 2 [IU] via SUBCUTANEOUS
  Administered 2024-06-20 – 2024-06-21 (×3): 1 [IU] via SUBCUTANEOUS
  Administered 2024-06-21: 3 [IU] via SUBCUTANEOUS
  Administered 2024-06-21 – 2024-06-22 (×4): 1 [IU] via SUBCUTANEOUS
  Administered 2024-06-22 (×2): 2 [IU] via SUBCUTANEOUS
  Administered 2024-06-22: 3 [IU] via SUBCUTANEOUS
  Administered 2024-06-22 – 2024-06-23 (×2): 2 [IU] via SUBCUTANEOUS
  Administered 2024-06-23 – 2024-06-24 (×3): 1 [IU] via SUBCUTANEOUS
  Administered 2024-06-24: 2 [IU] via SUBCUTANEOUS
  Administered 2024-06-24 – 2024-06-25 (×5): 1 [IU] via SUBCUTANEOUS
  Administered 2024-06-25 (×2): 2 [IU] via SUBCUTANEOUS
  Administered 2024-06-26 (×4): 1 [IU] via SUBCUTANEOUS
  Administered 2024-06-26: 2 [IU] via SUBCUTANEOUS
  Administered 2024-06-26 – 2024-06-27 (×3): 1 [IU] via SUBCUTANEOUS
  Administered 2024-06-27: 2 [IU] via SUBCUTANEOUS
  Administered 2024-06-27 – 2024-06-28 (×2): 1 [IU] via SUBCUTANEOUS
  Administered 2024-06-28: 2 [IU] via SUBCUTANEOUS
  Administered 2024-06-28 – 2024-06-29 (×5): 1 [IU] via SUBCUTANEOUS
  Administered 2024-06-29: 2 [IU] via SUBCUTANEOUS
  Administered 2024-06-29 – 2024-06-30 (×6): 1 [IU] via SUBCUTANEOUS
  Administered 2024-07-01: 2 [IU] via SUBCUTANEOUS
  Administered 2024-07-01 (×2): 1 [IU] via SUBCUTANEOUS
  Administered 2024-07-01: 2 [IU] via SUBCUTANEOUS
  Administered 2024-07-01: 1 [IU] via SUBCUTANEOUS
  Administered 2024-07-02 (×2): 2 [IU] via SUBCUTANEOUS
  Administered 2024-07-02: 1 [IU] via SUBCUTANEOUS
  Administered 2024-07-02 (×2): 2 [IU] via SUBCUTANEOUS
  Administered 2024-07-03 (×2): 3 [IU] via SUBCUTANEOUS
  Administered 2024-07-03 – 2024-07-04 (×4): 1 [IU] via SUBCUTANEOUS
  Administered 2024-07-04: 2 [IU] via SUBCUTANEOUS
  Administered 2024-07-04: 1 [IU] via SUBCUTANEOUS
  Administered 2024-07-05: 2 [IU] via SUBCUTANEOUS
  Administered 2024-07-05: 1 [IU] via SUBCUTANEOUS
  Administered 2024-07-05: 2 [IU] via SUBCUTANEOUS
  Administered 2024-07-05: 1 [IU] via SUBCUTANEOUS
  Administered 2024-07-06: 2 [IU] via SUBCUTANEOUS
  Administered 2024-07-06: 1 [IU] via SUBCUTANEOUS
  Administered 2024-07-06: 2 [IU] via SUBCUTANEOUS
  Administered 2024-07-07 (×2): 1 [IU] via SUBCUTANEOUS
  Administered 2024-07-08: 2 [IU] via SUBCUTANEOUS
  Administered 2024-07-08: 1 [IU] via SUBCUTANEOUS
  Administered 2024-07-08: 2 [IU] via SUBCUTANEOUS
  Administered 2024-07-08: 1 [IU] via SUBCUTANEOUS
  Filled 2024-06-05: qty 1
  Filled 2024-06-05 (×3): qty 2
  Filled 2024-06-05: qty 3
  Filled 2024-06-05 (×3): qty 1
  Filled 2024-06-05: qty 2

## 2024-06-05 MED ORDER — PREDNISONE 5 MG PO TABS
15.0000 mg | ORAL_TABLET | Freq: Every day | ORAL | Status: DC
  Administered 2024-06-06 – 2024-08-07 (×59): 15 mg
  Filled 2024-06-05 (×4): qty 3
  Filled 2024-06-05 (×3): qty 1
  Filled 2024-06-05 (×3): qty 3
  Filled 2024-06-05: qty 1
  Filled 2024-06-05 (×3): qty 3
  Filled 2024-06-05: qty 1
  Filled 2024-06-05 (×2): qty 3
  Filled 2024-06-05: qty 1
  Filled 2024-06-05: qty 3
  Filled 2024-06-05: qty 1
  Filled 2024-06-05 (×5): qty 3
  Filled 2024-06-05 (×3): qty 1
  Filled 2024-06-05 (×7): qty 3
  Filled 2024-06-05 (×3): qty 1
  Filled 2024-06-05 (×10): qty 3
  Filled 2024-06-05: qty 1
  Filled 2024-06-05 (×10): qty 3

## 2024-06-05 MED ORDER — CARVEDILOL 12.5 MG PO TABS
50.0000 mg | ORAL_TABLET | ORAL | Status: DC
  Administered 2024-06-05: 50 mg
  Filled 2024-06-05: qty 4

## 2024-06-05 MED ORDER — HYDRALAZINE HCL 50 MG PO TABS
75.0000 mg | ORAL_TABLET | ORAL | Status: DC
  Administered 2024-06-05 – 2024-06-07 (×3): 75 mg
  Filled 2024-06-05 (×3): qty 1

## 2024-06-05 MED ORDER — CHLORHEXIDINE GLUCONATE CLOTH 2 % EX PADS
6.0000 | MEDICATED_PAD | Freq: Every day | CUTANEOUS | Status: DC
  Administered 2024-06-05 – 2024-06-19 (×15): 6 via TOPICAL

## 2024-06-05 NOTE — Progress Notes (Signed)
 eLink Physician-Brief Progress Note Patient Name: [name] [name] DOB: 08/09/1981 MRN: [id number]   Date of Service  06/05/2024  HPI/Events of Note  Systolic bp 104 this am and has orders for both midodrine  and hydralazine .  eICU Interventions  Hydralazine  stopped for now     Intervention Category Intermediate Interventions: Other:  CLAUDENE AGENT, P 06/05/2024, 5:23 AM

## 2024-06-05 NOTE — Progress Notes (Signed)
 NAME:  Rosann Gorum, MRN:  [id number], DOB:  1981-09-01, LOS: 17 ADMISSION DATE:  05/19/2024 CONSULTATION DATE:  9/15/20025 REFERRING MD: Patsey - EDP, CHIEF COMPLAINT:  Code Stroke, ICH   History of Present Illness:  43 year old woman who presented to [facility] ED 9/15 as a Code Stroke. 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 Recent admissions 6/11-7/28 and 7/29-8/13 for ICH.  Patient presented to [facility] ED from dialysis as a Code Stroke; reportedly LKW 1450 with development of obtundation and hemiparesis. SBP 200s. NIHSS 31 with initial leftward gaze, decreased LOC/disorientation, bilateral facial droop/arm weakness/leg weakness, global aphasia, dysarthria, visual neglect. CT Head demonstrated large volume IVH with moderate hydrocephalus, acute hemorrhage of medial R temporal lobe and small foci of residual/recurrent hemorrhage in the L cerebellar hemisphere.   Patient was seen by stroke team and neurosurgery, EVD was placed on left side, PCCM was consulted for medical evaluation and admission to neuro ICU  Pertinent Medical History:   Past Medical History:  Diagnosis Date   Anemia    Anxiety    DM (diabetes mellitus), type 1 (HCC)    ESRD on hemodialysis (HCC)    Gastroparesis    History of simultaneous kidney and pancreas transplant (HCC) 2014   Hypertension    Ischemic cerebrovascular accident (CVA) (HCC) 11/2021   Narcotic abuse (HCC)    Non compliance with medical treatment    Stroke [facility])    Vision loss    Significant Hospital Events: Including procedures, antibiotic start and stop dates in addition to other pertinent events   9/15 admitted with ICH plus IVH, EVD was placed for hydrocephalus 9/16 remain on clevidipine  infusion, tolerating spontaneous breathing trial, EVD is draining bloody fluid 9/17 continue to  require clevidipine  infusion, tolerated spontaneous breathing trial 9/18: No overnight issues. Currently off sedation. Still on high-dose `clevidipine  infusion 9/19: this am, dialysis ongoing. Neurosx plans to continue ventriculostomy for now. Remains on cleviprex  to maintain BP goal. Tmax 100.1. despite plt decrease 4T score 0. Will maintain heparin  as is for now and closely follow trend.  9/27 Spiking low grade fevers, wheezing on exam, CXR normal  9/30 No tolerating when EVD is raised. Still bloody fluid. CT head will be done. 10/1 -developed fevers pancultures drawn and broad-spectrum antibiotics started.  Interim History / Subjective:   Patient is not febrile this morning.  According to the mother she is back to her normal self and talking.  She was sleeping when I went to see her.  EVD is at 10 cm  Objective:  Blood pressure (!) 143/65, pulse 100, temperature 98.9 F (37.2 C), temperature source Axillary, resp. rate 18, height 5' 3 (1.6 m), weight 46.6 kg, SpO2 100%.        Intake/Output Summary (Last 24 hours) at 06/05/2024 1316 Last data filed at 06/05/2024 1300 Gross per 24 hour  Intake 2051.3 ml  Output 424 ml  Net 1627.3 ml   Filed Weights   06/04/24 0840 06/04/24 1237 06/05/24 0500  Weight: 47.4 kg 45 kg 46.6 kg   Physical Examination: General: Ill-appearing,  minimal responsiveness, did not open eyes to stimulation Chest: Clear lungs bilaterally Heart: Regular rate and rhythm, S1, S2 Extremities: Noted muscle wasting, limited movement on the right Neuro: Left-sided gaze preference, minimal movement of the right    Assessment & Plan:  Acute right parietotemporal intraparenchymal hemorrhage with IVH with ICH  score 2 due to uncontrolled hypertension Hypertensive emergency Obstructive hydrocephalus status post EVD Acute left parietal ischemic stroke was ruled out Acute encephalopathy in the setting of ICH and hydrocephalus Acute respiratory  insufficiency Serratia/klebsiella pneumonia End-stage renal disease on hemodialysis with failed renal transplant but functional pancreatic transplant on immunosuppressive therapy Hypervolemic hyponatremia, hypochloremia  Type 1 diabetes, complicated with vasculopathy and gastroparesis- not on long term insulin  anymore Narcotic abuse Moderate protein calorie malnutrition Anemia, thrombocytopenia- am labs pending Stage 1 pressure ulcer POA #New onset low grade fevers without leukocytosis. Immunosuppression for pancreas kidney transplant  She had completed a 7 day course of antibiotics, resp culture growing serratia and kleb pneumo. Monitor fever curve for now. Low threshold to restart antibiotics. CXR has been clear. Blood cultures repeated today   -EVD per neurosurgery - Patient is not tolerating EVD elevation at this stage it is at 10 cm. -Repeat CT head today on 06/04/2024 shows no new issues - Continue home Vimpat  - Continue Tylenol  as needed fever -Hold HTN meds on HD days  -Brittle hemodynamics severe hypertension, with severe hypotension post HD - continue coreg  50BID, hydralazine  100 q8hrs, reduced clonidine  to 0.2 TID  on nondialysis days. Continue tube feeds Nepro at goal, TF per PEG -SSI - Anti-rejection meds for pancreatic transplant as ordered - on subcu heparin  5 BID - No Foley, patient is anuric, but will do bladder scan to rule out retention. -If blood cultures are negative in 48 hours we can start de-escalation of antibiotic therapy.   35 minutes of critical care time spent in caring for this patient excluding procedures.  Tamela Stakes, MD  Attending Physician, Critical Care Medicine [facility] See Amion for pager If no response to pager, please call [phone] until 7pm After 7pm, Please call E-link [phone]

## 2024-06-05 NOTE — TOC Progression Note (Signed)
 Transition of Care [facility]) - Progression Note    Patient Details  Name: [name] [name] MRN: [id number] Date of Birth: 1980-10-20  Transition of Care [facility]) CM/SW Contact  Inocente GORMAN Kindle, LCSW Phone Number: 06/05/2024, 8:47 AM  Clinical Narrative:    CSW continuing to follow for medical stability.    Expected Discharge Plan: Skilled Nursing Facility Barriers to Discharge: Continued Medical Work up, English as a second language teacher               Expected Discharge Plan and Services In-house Referral: Clinical Social Work   Post Acute Care Choice: Skilled Nursing Facility Living arrangements for the past 2 months: Apartment                                       Social Drivers of Health (SDOH) Interventions SDOH Screenings   Food Insecurity: Patient Unable To Answer (05/20/2024)  Housing: Patient Unable To Answer (05/20/2024)  Transportation Needs: Patient Unable To Answer (05/20/2024)  Utilities: Patient Unable To Answer (05/20/2024)  Social Connections: Unknown (10/04/2022)   Received from [facility]  Stress: No Stress Concern Present (04/16/2024)   Received from [facility]  Tobacco Use: Medium Risk (04/27/2024)    Readmission Risk Interventions    05/20/2024    4:16 PM 03/31/2024   11:27 AM 11/17/2021   12:15 PM  Readmission Risk Prevention Plan  Transportation Screening Complete Complete Complete  Medication Review Oceanographer) Complete Complete Complete  PCP or Specialist appointment within 3-5 days of discharge Complete  Complete  HRI or Home Care Consult Complete Complete Complete  SW Recovery Care/Counseling Consult Complete Complete Complete  Palliative Care Screening Complete Not Applicable Not Applicable  Skilled Nursing Facility Complete Not Applicable Patient Refused

## 2024-06-05 NOTE — Progress Notes (Signed)
 Subjective: The patient is a bit more alert.  Her mother is at the bedside.  Objective: Vital signs in last 24 hours: Temp:  [98.9 F (37.2 C)-100.5 F (38.1 C)] 100.5 F (38.1 C) (10/02 1600) Pulse Rate:  [89-119] 89 (10/02 1900) Resp:  [18-33] 27 (10/02 1900) BP: (102-156)/(49-74) 120/59 (10/02 1900) SpO2:  [99 %-100 %] 100 % (10/02 1900) Weight:  [46.6 kg] 46.6 kg (10/02 0500) Estimated body mass index is 18.2 kg/m as calculated from the following:   Height as of this encounter: 5' 3 (1.6 m).   Weight as of this encounter: 46.6 kg.   Intake/Output from previous day: 10/01 0701 - 10/02 0700 In: 2081.3 [NG/GT:1274.3; IV Piggyback:642] Out: 2892 [Drains:242; Stool:250] Intake/Output this shift: No intake/output data recorded.  Physical exam patient opens her eyes to voice.  She does not follow commands.  Her ventriculostomy is patent and draining.  Lab Results: Recent Labs    06/05/24 1513  WBC 4.3  HGB 8.3*  HCT 25.9*  PLT 171   BMET Recent Labs    06/04/24 1805 06/05/24 0549  NA 131* 130*  K 4.4 5.7*  CL 90* 89*  CO2 25 23  GLUCOSE 198* 138*  BUN 50* 71*  CREATININE 2.64* 3.44*  CALCIUM  10.6* 11.3*    Studies/Results: DG CHEST PORT 1 VIEW Result Date: 06/04/2024 EXAM: 1 VIEW(S) XRAY OF THE CHEST 06/04/2024 05:37:00 PM COMPARISON: 05/31/2024 CLINICAL HISTORY: Sepsis; Hx stroke FINDINGS: LUNGS AND PLEURA: Low lung volumes. Chronic interstitial markings. No focal pulmonary opacity. No pulmonary edema. No pleural effusion. No pneumothorax. HEART AND MEDIASTINUM: No acute abnormality of the cardiac and mediastinal silhouettes. BONES AND SOFT TISSUES: No acute osseous abnormality. IMPRESSION: 1. No acute findings. 2. Low lung volumes and chronic interstitial markings. Electronically signed by: Donnice Mania MD 06/04/2024 07:11 PM EDT RP Workstation: [id number]   CT HEAD WO CONTRAST ( ) Result Date: 06/04/2024 CLINICAL DATA:  Provided history: Delirium. EVD  in-situ. Prior head CT more sleepy this morning. EXAM: CT HEAD WITHOUT CONTRAST TECHNIQUE: Contiguous axial images were obtained from the base of the skull through the vertex without intravenous contrast. RADIATION DOSE REDUCTION: This exam was performed according to the departmental dose-optimization program which includes automated exposure control, adjustment of the mA and/or kV according to patient size and/or use of iterative reconstruction technique. COMPARISON:  Examinations 05/30/2024 and earlier. FINDINGS: Brain: As before, a left frontal approach ventricular catheter enters the left lateral ventricle and terminates near midline. Ventriculomegaly with unchanged size and configuration of the ventricular system. Hemorrhage within the lateral ventricles (right greater than left), slightly decreased. Redemonstrated chronic cortically-based infarct within the left parietal lobe. Background patchy and confluent hypoattenuation within the cerebral white matter, similar to the prior exam and likely reflecting a combination of chronic small vessel ischemic disease and transependymal interstitial edema. Known chronic infarcts within the thalami, pons and left cerebellar hemisphere. No midline shift. Vascular: No hyperdense vessel. Atherosclerotic calcifications. Skull: Bilateral burr holes. No acute calvarial fracture or aggressive osseous lesion. Sinuses/Orbits: No orbital mass or acute orbital finding. Moderate mucosal thickening within the bilateral sphenoid sinuses. Other: Bilateral middle ear/mastoid effusions. IMPRESSION: 1. Unchanged position of a left frontal approach ventricular catheter. 2. Hemorrhage within the lateral ventricles (right greater than left), slightly decreased. 3. Ventriculomegaly with unchanged size and configuration of the ventricular system (and likely transependymal interstitial edema). 4. Background parenchymal atrophy, chronic small vessel ischemic disease and chronic infarcts, as  described. 5. Bilateral middle ear/mastoid effusions. Electronically Signed  By: Rockey Childs D.O.   On: 06/04/2024 15:48    Assessment/Plan: Intraventricular hemorrhage, hydrocephalus, ventriculostomy: The patient may need a ventriculoperitoneal shunt after her vertebral spinal fluid has cleared.  We will attempt to challenge again next week.  LOS: 17 days     [name] [name] [name] 06/05/2024, 7:19 PM     Patient ID: [name] [name], female   DOB: Aug 16, 1981, 43 y.o.   MRN: [id number]

## 2024-06-05 NOTE — Progress Notes (Signed)
 Patient ID: [name] [name], female   DOB: 1981/08/03, 43 y.o.   MRN: [id number] [facility] Progress Note   Subjective:   Not responsive   Objective:   BP (!) 128/54   Pulse (!) 116   Temp 98.6 F (37 C)   Resp (!) 28   Ht 5' 3 (1.6 m)   Wt 45 kg   SpO2 99%   BMI 17.57 kg/m   Physical Exam: Gen: Comfortably resting in bed, unresponsive. +Ventriculostomy drain CVS: Pulse regular tachycardia, S1 and S2 normal Resp: Anteriorly clear to auscultation, no rales/rhonchi Abd: Soft, flat, nontender, bowel sounds normal Ext: No lower extremity edema.  Right upper arm AV fistula with intact dressings  OP HD: NW MWF 3h  B400  55.2kg  2K bath   AVF  Heparin  none Mircera 150 mcg q 2 wks, due 9/24    Assessment/ Plan:   1.  Acute right intracranial hemorrhage: Associated with uncontrolled hypertension and status post ventriculostomy.  Extubated 9/22. Ventriculostomy fluid remains bloody. May need VP shunt. Per NSG.  2. ESRD following failure of renal allograft: Continue HD on MWF schedule via right BCF. Next HD Friday.  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 3. Anemia of esrd: Hb 8-9 range with ongoing overt mild loss from ventriculostomy. Getting darbe 100 mcg sq weekly.  4. CKD-MBD: Elevated calcium  and phosphorus levels noted, continue to monitor on current tube feeds. Not on Ca supps, PTH pending.  5. Nutrition: Ongoing tube feeds, monitor electrolytes/volume status. 6. Hypertension: Blood pressure's very high, then dropped low and now is getting norvasc  + clonidine  on non HD days, and coreg  50 mg bid daily.    Myer Fret  MD  [facility] 06/05/2024, 8:55 AM  Recent Labs  Lab 05/30/24 0207 05/31/24 0448 06/01/24 0553 06/02/24 0548 06/04/24 1805 06/05/24 0549  HGB 8.9* 9.0* 8.4* 8.7*  --   --   ALBUMIN   --   --   --   --   3.3* 3.4*  CALCIUM  10.6* 10.6* 11.1* 11.3* 10.6* 11.3*  PHOS 4.5 4.0  --   --   --   --   CREATININE 3.93* 2.90* 4.70* 5.72* 2.64* 3.44*  K 4.7 3.9 4.7 5.6* 4.4 5.7*    Inpatient medications:  amLODipine   10 mg Per Tube Once per day on Sunday Tuesday Thursday Saturday   arformoterol  15 mcg Nebulization BID   carvedilol   50 mg Per Tube BID WC   Chlorhexidine  Gluconate Cloth  6 each Topical Q0600   cloNIDine   0.2 mg Per Tube 3 times per day on Sunday Tuesday Thursday Saturday   cycloSPORINE   200 mg Per Tube QPM   cycloSPORINE   225 mg Per Tube Daily   darbepoetin (ARANESP ) injection - DIALYSIS  100 mcg Subcutaneous Q Wed-1800   feeding supplement (PROSource TF20)  60 mL Per Tube Daily   fiber supplement (BANATROL TF)  60 mL Per Tube BID   heparin  injection (subcutaneous)  5,000 Units Subcutaneous Q12H   insulin  aspart  0-6 Units Subcutaneous Q4H   lacosamide   100 mg Per Tube BID   midodrine   10 mg Per Tube Q8H   multivitamin  15 mL Per Tube Daily   mycophenolate   500 mg Per Tube BID   mouth rinse  15 mL Mouth Rinse 4 times per day   pantoprazole  (PROTONIX ) IV  40 mg Intravenous QHS  predniSONE   15 mg Per Tube Q breakfast    anticoagulant sodium citrate      ceFEPime  (MAXIPIME ) IV Stopped (06/04/24 2128)   feeding supplement (OSMOLITE 1.5 CAL) 55 mL/hr at 06/05/24 0800   [START ON 06/06/2024] vancomycin      acetaminophen  **OR** acetaminophen  (TYLENOL ) oral liquid 160 mg/5 mL **OR** acetaminophen , alteplase , anticoagulant sodium citrate , feeding supplement (NEPRO CARB STEADY), heparin , hydrALAZINE , labetalol , lidocaine  (PF), lidocaine -prilocaine , mouth rinse, pentafluoroprop-tetrafluoroeth, sennosides

## 2024-06-05 NOTE — Progress Notes (Signed)
                                                                                                                                                                                                          Palliative Medicine Progress Note   Patient Name: [name] [name]       Date: 06/05/2024 DOB: 04/29/1981  Age: 43 y.o. MRN#: [id number] Attending Physician: Gatha Pence, MD Primary Care Physician: [facility] [place] [facility]mit Date: 05/19/2024  Reason for Consultation/Follow-up: {Reason for Consult:23484}  HPI/Patient Profile:  43 y.o. female  with past medical history of ESRD on HD, failed renal transplant, pancreatic transplant, DM1, gastroparesis recent admission 6/11-7/28 for ICH with d/c to [facility] and eventual d/c to [facility] on 8/13, admitted on 05/19/2024 from dialysis with code stroke. Workup revealed recurrent large intraparenchymal hemorrhage. EVD placed by neurosurgery. Palliative consulted for medical decision making.   Subjective: ***  Objective:  Physical Exam          Vital Signs: BP (!) 148/64   Pulse (!) 105   Temp 98.9 F (37.2 C) (Axillary)   Resp (!) 21   Ht 5' 3 (1.6 m)   Wt 46.6 kg   SpO2 100%   BMI 18.20 kg/m  SpO2: SpO2: 100 % O2 Device: O2 Device: Room Air O2 Flow Rate: O2 Flow Rate (L/min): 2 L/min  Intake/output summary:  Intake/Output Summary (Last 24 hours) at 06/05/2024 1601 Last data filed at 06/05/2024 1500 Gross per 24 hour  Intake 2011.3 ml  Output 426 ml  Net 1585.3 ml    LBM: Last BM Date : 06/05/24     Palliative Assessment/Data: ***     Palliative Medicine Assessment & Plan   Assessment: Principal Problem:   Hemorrhagic stroke [facility]) Active Problems:   Moderate protein-calorie malnutrition   Pressure injury of skin   Nontraumatic subcortical hemorrhage of right cerebral hemisphere [facility])    Recommendations/Plan: ***  Goals of Care and Additional Recommendations: Limitations on Scope of  Treatment: {Recommended Scope and Preferences:21019}  Code Status:   Prognosis:  {Palliative Care Prognosis:23504}  Discharge Planning: {Palliative dispostion:23505}  Care plan was discussed with ***  Thank you for allowing the Palliative Medicine Team to assist in the care of this patient.   ***   Recardo KATHEE Loll, NP   Please contact Palliative Medicine Team phone at [phone] for questions and concerns.  For individual providers, please see AMION.

## 2024-06-06 DIAGNOSIS — E875 Hyperkalemia: Secondary | ICD-10-CM

## 2024-06-06 DIAGNOSIS — G911 Obstructive hydrocephalus: Secondary | ICD-10-CM | POA: Diagnosis not present

## 2024-06-06 DIAGNOSIS — D631 Anemia in chronic kidney disease: Secondary | ICD-10-CM

## 2024-06-06 DIAGNOSIS — Z9483 Pancreas transplant status: Secondary | ICD-10-CM

## 2024-06-06 DIAGNOSIS — I610 Nontraumatic intracerebral hemorrhage in hemisphere, subcortical: Secondary | ICD-10-CM | POA: Diagnosis not present

## 2024-06-06 DIAGNOSIS — E440 Moderate protein-calorie malnutrition: Secondary | ICD-10-CM | POA: Diagnosis not present

## 2024-06-06 DIAGNOSIS — E1059 Type 1 diabetes mellitus with other circulatory complications: Secondary | ICD-10-CM

## 2024-06-06 DIAGNOSIS — J1569 Pneumonia due to other gram-negative bacteria: Secondary | ICD-10-CM | POA: Diagnosis not present

## 2024-06-06 LAB — GLUCOSE, CAPILLARY
Glucose-Capillary: 152 mg/dL — ABNORMAL HIGH (ref 70–99)
Glucose-Capillary: 177 mg/dL — ABNORMAL HIGH (ref 70–99)
Glucose-Capillary: 166 mg/dL — ABNORMAL HIGH (ref 70–99)
Glucose-Capillary: 217 mg/dL — ABNORMAL HIGH (ref 70–99)
Glucose-Capillary: 121 mg/dL — ABNORMAL HIGH (ref 70–99)
Glucose-Capillary: 135 mg/dL — ABNORMAL HIGH (ref 70–99)

## 2024-06-06 LAB — BASIC METABOLIC PANEL WITH GFR
Anion gap: 16 — ABNORMAL HIGH (ref 5–15)
BUN: 43 mg/dL — ABNORMAL HIGH (ref 6–20)
CO2: 24 mmol/L (ref 22–32)
Calcium: 10.9 mg/dL — ABNORMAL HIGH (ref 8.9–10.3)
Chloride: 93 mmol/L — ABNORMAL LOW (ref 98–111)
Creatinine, Ser: 2.32 mg/dL — ABNORMAL HIGH (ref 0.44–1.00)
GFR, Estimated: 26 mL/min — ABNORMAL LOW (ref >60)
Glucose, Bld: 133 mg/dL — ABNORMAL HIGH (ref 70–99)
Potassium: 3.9 mmol/L (ref 3.5–5.1)
Sodium: 133 mmol/L — ABNORMAL LOW (ref 135–145)

## 2024-06-06 LAB — COMPREHENSIVE METABOLIC PANEL WITH GFR
ALT: 12 U/L (ref 0–44)
AST: 14 U/L — ABNORMAL LOW (ref 15–41)
Albumin: 3.3 g/dL — ABNORMAL LOW (ref 3.5–5.0)
Alkaline Phosphatase: 100 U/L (ref 38–126)
Anion gap: 22 — ABNORMAL HIGH (ref 5–15)
BUN: 112 mg/dL — ABNORMAL HIGH (ref 6–20)
CO2: 22 mmol/L (ref 22–32)
Calcium: 12 mg/dL — ABNORMAL HIGH (ref 8.9–10.3)
Chloride: 87 mmol/L — ABNORMAL LOW (ref 98–111)
Creatinine, Ser: 4.83 mg/dL — ABNORMAL HIGH (ref 0.44–1.00)
GFR, Estimated: 11 mL/min — ABNORMAL LOW (ref >60)
Glucose, Bld: 155 mg/dL — ABNORMAL HIGH (ref 70–99)
Potassium: 6.5 mmol/L (ref 3.5–5.1)
Sodium: 131 mmol/L — ABNORMAL LOW (ref 135–145)
Total Bilirubin: 0.9 mg/dL (ref 0.0–1.2)
Total Protein: 6.9 g/dL (ref 6.5–8.1)

## 2024-06-06 LAB — CBC
HCT: 26.1 % — ABNORMAL LOW (ref 36.0–46.0)
Hemoglobin: 8.4 g/dL — ABNORMAL LOW (ref 12.0–15.0)
MCH: 31 pg (ref 26.0–34.0)
MCHC: 32.2 g/dL (ref 30.0–36.0)
MCV: 96.3 fL (ref 80.0–100.0)
Platelets: 169 K/uL (ref 150–400)
RBC: 2.71 MIL/uL — ABNORMAL LOW (ref 3.87–5.11)
RDW: 15.7 % — ABNORMAL HIGH (ref 11.5–15.5)
WBC: 3.8 K/uL — ABNORMAL LOW (ref 4.0–10.5)
nRBC: 0 % (ref 0.0–0.2)

## 2024-06-06 LAB — CULTURE, BLOOD (ROUTINE X 2)
Culture: NO GROWTH
Special Requests: ADEQUATE

## 2024-06-06 MED ORDER — LIDOCAINE-PRILOCAINE 2.5-2.5 % EX CREA: 1.0000 | TOPICAL_CREAM | CUTANEOUS | Status: DC | PRN

## 2024-06-06 MED ORDER — NEPRO/CARBSTEADY PO LIQD: 237.0000 mL | ORAL | Status: DC | PRN

## 2024-06-06 MED ORDER — MIDODRINE HCL 5 MG PO TABS
10.0000 mg | ORAL_TABLET | Freq: Three times a day (TID) | ORAL | Status: DC | PRN
Start: 2024-06-06 — End: 2024-06-26
  Administered 2024-06-20: 10 mg
  Filled 2024-06-06: qty 2

## 2024-06-06 MED ORDER — PENTAFLUOROPROP-TETRAFLUOROETH EX AERO: 1.0000 | INHALATION_SPRAY | CUTANEOUS | Status: DC | PRN

## 2024-06-06 MED ORDER — LIDOCAINE HCL (PF) 1 % IJ SOLN: 5.0000 mL | INTRAMUSCULAR | Status: DC | PRN

## 2024-06-06 MED ORDER — HEPARIN SODIUM (PORCINE) 1000 UNIT/ML DIALYSIS: 1000.0000 [IU] | INTRAMUSCULAR | Status: DC | PRN

## 2024-06-06 MED ORDER — ANTICOAGULANT SODIUM CITRATE 4% (200MG/5ML) IV SOLN: 5.0000 mL | Status: DC | PRN

## 2024-06-06 MED ORDER — PIPERACILLIN-TAZOBACTAM IN DEX 2-0.25 GM/50ML IV SOLN
2.2500 g | Freq: Three times a day (TID) | INTRAVENOUS | Status: AC
Start: 2024-06-06 — End: 2024-06-09
  Administered 2024-06-06 – 2024-06-09 (×9): 2.25 g via INTRAVENOUS
  Filled 2024-06-06 (×10): qty 50

## 2024-06-06 MED ORDER — BUSPIRONE HCL 5 MG PO TABS
5.0000 mg | ORAL_TABLET | Freq: Two times a day (BID) | ORAL | Status: DC
  Administered 2024-06-06 – 2024-06-08 (×6): 5 mg
  Filled 2024-06-06 (×7): qty 1

## 2024-06-06 MED ORDER — NEPRO/CARBSTEADY PO LIQD
1000.0000 mL | ORAL | Status: DC
Start: 2024-06-06 — End: 2024-07-04
  Administered 2024-06-06 – 2024-07-04 (×24): 1000 mL
  Filled 2024-06-06 (×29): qty 1000

## 2024-06-06 MED ORDER — NUTRISOURCE FIBER PO PACK
1.0000 | PACK | Freq: Three times a day (TID) | ORAL | Status: DC
  Administered 2024-06-06 – 2024-07-15 (×104): 1
  Filled 2024-06-06 (×117): qty 1

## 2024-06-06 NOTE — Progress Notes (Signed)
 Received patient in bed.Disoriented x 4. Response to pain only.  Accessed used : Right arm avf that worked well.  Duration of treatment: 3.25 hours.  Net uf: 1200 cc.  Hand off to the patient's nurse at bedside with good vitals as recorded.

## 2024-06-06 NOTE — Progress Notes (Signed)
 Nutrition Follow-up  DOCUMENTATION CODES:   Underweight, Non-severe (moderate) malnutrition in context of chronic illness  INTERVENTION:   Tube feeding via G-J tube: Nepro at 55 ml/h (1320 ml per day)  D/C Prosource TF20   Provides 1980 kcal, 106 gm protein, 972 ml free water  daily  D/C Banatrol   Add nutrisource fiber TID  Check CRP, Vitamin C, Folate, Zinc , Copper levels  Medications likely contributing to diarrhea but has a J-tube so limited options for medication administration   NUTRITION DIAGNOSIS:   Moderate Malnutrition related to chronic illness (ESRD on HD/DM and gastroparesis) as evidenced by severe muscle depletion, mild fat depletion. Ongoing.   GOAL:   Patient will meet greater than or equal to 90% of their needs Met with TF at goal  MONITOR:   Diet advancement, Vent status, Labs, I & O's, TF tolerance  REASON FOR ASSESSMENT:   Consult Enteral/tube feeding initiation and management  ASSESSMENT:   43 year old female who presented as a Code Stroke with IVH. 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 Recent admissions 6/11-7/28 and 7/29-8/13 for ICH with GJ tube.  Pt discussed during ICU rounds and with RN and MD.    6/11 - 7/29: Hospital admission, discharge to [facility] 8/12 - G tube converted to GJ  9/16 - Admitted, EVD placed, intubated 9/21 - switched to Nepro formula; FMS placed 9/22 - Extubated  9/23 - adjusted rate of Nepro to meet needs (was previously exceeding) 9/25 - D/C Nepro, Started Osmolite 1.5 10/3 - Renal changed TF to Nepro due to higher K (likely due to no HD over weekend)  Medications reviewed and include: aranesp  every Wed, ProSource TF20 daily, banatrol BID, SSI every 4 hours, liquid MVI daily, cellcept  oral suspension BID, protonix , prednisone  Nepro @ 55  ml/hr  Labs reviewed:  Na 131 K 6.5 BUN 112 Folate 16.6 (03/01/24) CBG's: 152-177  EVD: 177 ml  18 F G-J tube  Diet Order:   Diet Order             Diet NPO time specified  Diet effective now                   EDUCATION NEEDS:   Not appropriate for education at this time  Skin:  Skin Assessment: Skin Integrity Issues: Skin Integrity Issues:: Stage I Stage I: buttocks  Last BM:  400 ml via FMS  Height:   Ht Readings from Last 1 Encounters:  05/21/24 5' 3 (1.6 m)    Weight:   Wt Readings from Last 1 Encounters:  06/06/24 49.2 kg    BMI:  Body mass index is 19.21 kg/m.  Estimated Nutritional Needs:   Kcal:  1700-1900  Protein:  80-100 grams  Fluid:  1L +UOP  Zacari Stiff P., RD, LDN, CNSC See AMiON for contact information

## 2024-06-06 NOTE — Progress Notes (Addendum)
 Patient ID: [name] [name], female   DOB: [birth year], 43 y.o.   MRN: [id number] [facility] Progress Note   Subjective:   Not responding No new issues   Objective:   BP (!) 128/54   Pulse (!) 116   Temp 98.6 F (37 C)   Resp (!) 28   Ht 5' 3 (1.6 m)   Wt 45 kg   SpO2 99%   BMI 17.57 kg/m   Physical Exam: Gen: Comfortably resting in bed, unresponsive. +Ventriculostomy drain CVS: Pulse regular tachycardia, S1 and S2 normal Resp: Anteriorly clear to auscultation, no rales/rhonchi Abd: Soft, flat, nontender, bowel sounds normal Ext: No lower extremity edema RUA AVF+bruit   OP HD: NW MWF 3h  B400  55.2kg  2K bath   AVF  Heparin  none Mircera 150 mcg q 2 wks, due 9/24    Assessment/ Plan:   Acute right intracranial hemorrhage: Associated with uncontrolled hypertension and status post ventriculostomy.  Extubated 9/22. Ventriculostomy fluid remains bloody. May need VP shunt. Per NSG.  ESRD/ hx of renal allograft failure: Continue HD on MWF. Next HD today.  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 Hyperkalemia: due to tube feeds > will change to Nepro TF's Anemia of esrd: Hb 8-9 range with ongoing overt mild loss from ventriculostomy. Getting darbe 100 mcg sq weekly.  CKD-MBD: Elevated calcium  and phosphorus levels noted, continue to monitor on current tube feeds. PTH level is low at 29. Suspect ^Ca++ due to immobility.  Nutrition: Ongoing tube feeds, monitor electrolytes/volume status. Hypertension: BP's were very high, then dropped low. Is now getting norvasc , clonidine  and coreg  on non-hd days only.  Volume: under dry wt 5-10 kg. UF 1-2 L w/ HD today.     Myer Fret  MD  [facility] 06/06/2024, 11:51 AM  Recent Labs  Lab 05/31/24 0448 06/01/24 0553 06/05/24 0549 06/05/24 1513 06/06/24 0533  HGB 9.0*   < >  --  8.3*  8.4*  ALBUMIN   --    < > 3.4*  --  3.3*  CALCIUM  10.6*   < > 11.3*  --  12.0*  PHOS 4.0  --   --   --   --   CREATININE 2.90*   < > 3.44*  --  4.83*  K 3.9   < > 5.7*  --  6.5*   < > = values in this interval not displayed.    Inpatient medications:  amLODipine   10 mg Per Tube Once per day on Sunday Tuesday Thursday Saturday   arformoterol  15 mcg Nebulization BID   busPIRone   5 mg Per Tube BID   carvedilol   50 mg Per Tube 2 times per day on Sunday Tuesday Thursday Saturday   Chlorhexidine  Gluconate Cloth  6 each Topical Q0600   Chlorhexidine  Gluconate Cloth  6 each Topical Q0600   cloNIDine   0.2 mg Per Tube 3 times per day on Sunday Tuesday Thursday Saturday   cycloSPORINE   200 mg Per Tube QPM   cycloSPORINE   225 mg Per Tube Daily   darbepoetin (ARANESP ) injection - DIALYSIS  100 mcg Subcutaneous Q Wed-1800   feeding supplement (PROSource TF20)  60 mL Per Tube Daily   heparin  injection (subcutaneous)  5,000 Units Subcutaneous Q12H   hydrALAZINE   75 mg Per Tube 3 times per day on Sunday Tuesday Thursday Saturday   insulin  aspart  0-9 Units Subcutaneous Q4H  lacosamide   100 mg Per Tube BID   multivitamin  15 mL Per Tube Daily   mycophenolate   500 mg Per Tube BID   mouth rinse  15 mL Mouth Rinse 4 times per day   pantoprazole  (PROTONIX ) IV  40 mg Intravenous QHS   predniSONE   15 mg Per Tube Q breakfast    anticoagulant sodium citrate      anticoagulant sodium citrate      feeding supplement (OSMOLITE 1.5 CAL) 55 mL/hr at 06/06/24 0900   piperacillin -tazobactam (ZOSYN )  IV     vancomycin      acetaminophen  **OR** acetaminophen  (TYLENOL ) oral liquid 160 mg/5 mL **OR** acetaminophen , alteplase , anticoagulant sodium citrate , anticoagulant sodium citrate , feeding supplement (NEPRO CARB STEADY), feeding supplement (NEPRO CARB STEADY), heparin , heparin , hydrALAZINE , labetalol , lidocaine  (PF), lidocaine  (PF), lidocaine -prilocaine , lidocaine -prilocaine , midodrine , mouth rinse,  pentafluoroprop-tetrafluoroeth, pentafluoroprop-tetrafluoroeth, sennosides

## 2024-06-06 NOTE — Progress Notes (Signed)
 NAME:  [name] [name], MRN:  [id number], DOB:  01-Nov-1980, LOS: 18 ADMISSION DATE:  05/19/2024 CONSULTATION DATE:  9/15/20025 REFERRING MD: Patsey - EDP, CHIEF COMPLAINT:  Code Stroke, ICH   History of Present Illness:  43 year old woman who presented to [facility] ED 9/15 as a Code Stroke. 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 Recent admissions 6/11-7/28 and 7/29-8/13 for ICH.  Patient presented to [facility] ED from dialysis as a Code Stroke; reportedly LKW 1450 with development of obtundation and hemiparesis. SBP 200s. NIHSS 31 with initial leftward gaze, decreased LOC/disorientation, bilateral facial droop/arm weakness/leg weakness, global aphasia, dysarthria, visual neglect. CT Head demonstrated large volume IVH with moderate hydrocephalus, acute hemorrhage of medial R temporal lobe and small foci of residual/recurrent hemorrhage in the L cerebellar hemisphere.   Patient was seen by stroke team and neurosurgery, EVD was placed on left side, PCCM was consulted for medical evaluation and admission to neuro ICU  Pertinent Medical History:   Past Medical History:  Diagnosis Date   Anemia    Anxiety    DM (diabetes mellitus), type 1 (HCC)    ESRD on hemodialysis (HCC)    Gastroparesis    History of simultaneous kidney and pancreas transplant (HCC) 2014   Hypertension    Ischemic cerebrovascular accident (CVA) (HCC) 11/2021   Narcotic abuse (HCC)    Non compliance with medical treatment    Stroke [facility])    Vision loss    Significant Hospital Events: Including procedures, antibiotic start and stop dates in addition to other pertinent events   9/15 admitted with ICH plus IVH, EVD was placed for hydrocephalus 9/16 remain on clevidipine  infusion, tolerating spontaneous breathing trial, EVD is draining bloody fluid 9/17 continue to  require clevidipine  infusion, tolerated spontaneous breathing trial 9/18: No overnight issues. Currently off sedation. Still on high-dose `clevidipine  infusion 9/19: this am, dialysis ongoing. Neurosx plans to continue ventriculostomy for now. Remains on cleviprex  to maintain BP goal. Tmax 100.1. despite plt decrease 4T score 0. Will maintain heparin  as is for now and closely follow trend.  9/27 Spiking low grade fevers, wheezing on exam, CXR normal  9/30 No tolerating when EVD is raised. Still bloody fluid. CT head will be done. 10/1 -developed fevers pancultures drawn and broad-spectrum antibiotics started 10/2 became afebrile after starting antibiotics, did not tolerate raising EVD  Interim History / Subjective:  Patient remained afebrile No overnight issues Receiving hemodialysis today  Objective:  Blood pressure (!) 140/64, pulse 90, temperature 98.7 F (37.1 C), temperature source Axillary, resp. rate (!) 24, height 5' 3 (1.6 m), weight 49.1 kg, SpO2 100%.        Intake/Output Summary (Last 24 hours) at 06/06/2024 0956 Last data filed at 06/06/2024 0900 Gross per 24 hour  Intake 1450 ml  Output 496 ml  Net 954 ml   Filed Weights   06/04/24 1237 06/05/24 0500 06/06/24 0500  Weight: 45 kg 46.6 kg 49.1 kg   Physical Examination: General: Chronically ill-appearing middle-aged female, lying on the bed HEENT: [place]/AT, eyes anicteric.  moist mucus membranes Neuro: Awake, tracking examiner, not following commands, withdrawing in bilateral lower extremities, plegic right upper extremity Chest: Coarse breath sounds, no wheezes or rhonchi Heart: Regular rate and rhythm, no murmurs or gallops Abdomen: Soft, nontender, nondistended, bowel sounds present  Labs and images reviewed   Resolved Hospital problems  Hypertensive emergency, resolved Acute  respiratory insufficiency  thrombocytopenia of critical illness  Assessment & Plan:  Acute right parietotemporal intraparenchymal  hemorrhage with IVH with ICH score 2 due to uncontrolled hypertension Obstructive hydrocephalus status post EVD Acute encephalopathy in the setting of ICH and hydrocephalus Serratia/klebsiella pneumonia End-stage renal disease on hemodialysis with failed renal transplant but functional pancreatic transplant on immunosuppressive therapy Hypervolemic hyponatremia, hypochloremia  Hyperkalemia Type 1 diabetes, complicated with vasculopathy and gastroparesis- not on long term insulin  anymore Narcotic abuse Moderate protein calorie malnutrition Anemia of chronic disease Stage 1 pressure ulcer POA New onset low grade fevers, unclear source Immunosuppression for pancreas kidney transplant  Continue neuro watch She remained aphasic Neurosurgery is following EVD remain at 10 cm water , unable to raise EVD due to high-volume output Once CSF fluid clears, patient may end up requiring VP shunt Mental status is improving Completed antibiotic therapy for pneumonia but restarted back on antibiotics on 10/1 for fever Cultures have been negative Restarted back on antibiotics with vancomycin  and cefepime  but will switch cefepime  to Zosyn  considering neurotoxicity and renal patient Closely monitor electrolytes Receiving hemodialysis Continue sliding scale insulin  with CBG goal 140-180 Monitor H&H and platelet counts Continue prednisone  and cyclosporine  Continue oral antihypertensive meds Continue wound care     Valinda Novas, MD [facility] See Amion for pager If no response to pager, please call [phone] until 7pm After 7pm, Please call E-link [phone]

## 2024-06-06 NOTE — Progress Notes (Signed)
 Subjective: The patient is alert and in no apparent distress.  Her mother is at the bedside.  Objective: Vital signs in last 24 hours: Temp:  [98.1 F (36.7 C)-100.5 F (38.1 C)] 98.4 F (36.9 C) (10/03 0400) Pulse Rate:  [82-105] 88 (10/03 0600) Resp:  [18-29] 24 (10/03 0600) BP: (93-156)/(50-74) 128/60 (10/03 0600) SpO2:  [99 %-100 %] 99 % (10/03 0600) Weight:  [49.1 kg] 49.1 kg (10/03 0500) Estimated body mass index is 19.17 kg/m as calculated from the following:   Height as of this encounter: 5' 3 (1.6 m).   Weight as of this encounter: 49.1 kg.   Intake/Output from previous day: 10/02 0701 - 10/03 0700 In: 1340 [NG/GT:1260] Out: 494 [Drains:169; Stool:325] Intake/Output this shift: Total I/O In: 680 [Other:80; NG/GT:600] Out: 76 [Drains:76]  Physical exam patient is alert.  She fidgets with her right hand/arm.  Her ventriculostomy is patent.  Lab Results: Recent Labs    06/05/24 1513 06/06/24 0533  WBC 4.3 3.8*  HGB 8.3* 8.4*  HCT 25.9* 26.1*  PLT 171 169   BMET Recent Labs    06/04/24 1805 06/05/24 0549  NA 131* 130*  K 4.4 5.7*  CL 90* 89*  CO2 25 23  GLUCOSE 198* 138*  BUN 50* 71*  CREATININE 2.64* 3.44*  CALCIUM  10.6* 11.3*    Studies/Results: DG CHEST PORT 1 VIEW Result Date: 06/04/2024 EXAM: 1 VIEW(S) XRAY OF THE CHEST 06/04/2024 05:37:00 PM COMPARISON: 05/31/2024 CLINICAL HISTORY: Sepsis; Hx stroke FINDINGS: LUNGS AND PLEURA: Low lung volumes. Chronic interstitial markings. No focal pulmonary opacity. No pulmonary edema. No pleural effusion. No pneumothorax. HEART AND MEDIASTINUM: No acute abnormality of the cardiac and mediastinal silhouettes. BONES AND SOFT TISSUES: No acute osseous abnormality. IMPRESSION: 1. No acute findings. 2. Low lung volumes and chronic interstitial markings. Electronically signed by: Donnice Mania MD 06/04/2024 07:11 PM EDT RP Workstation: [id number]   CT HEAD WO CONTRAST ( ) Result Date: 06/04/2024 CLINICAL DATA:   Provided history: Delirium. EVD in-situ. Prior head CT more sleepy this morning. EXAM: CT HEAD WITHOUT CONTRAST TECHNIQUE: Contiguous axial images were obtained from the base of the skull through the vertex without intravenous contrast. RADIATION DOSE REDUCTION: This exam was performed according to the departmental dose-optimization program which includes automated exposure control, adjustment of the mA and/or kV according to patient size and/or use of iterative reconstruction technique. COMPARISON:  Examinations 05/30/2024 and earlier. FINDINGS: Brain: As before, a left frontal approach ventricular catheter enters the left lateral ventricle and terminates near midline. Ventriculomegaly with unchanged size and configuration of the ventricular system. Hemorrhage within the lateral ventricles (right greater than left), slightly decreased. Redemonstrated chronic cortically-based infarct within the left parietal lobe. Background patchy and confluent hypoattenuation within the cerebral white matter, similar to the prior exam and likely reflecting a combination of chronic small vessel ischemic disease and transependymal interstitial edema. Known chronic infarcts within the thalami, pons and left cerebellar hemisphere. No midline shift. Vascular: No hyperdense vessel. Atherosclerotic calcifications. Skull: Bilateral burr holes. No acute calvarial fracture or aggressive osseous lesion. Sinuses/Orbits: No orbital mass or acute orbital finding. Moderate mucosal thickening within the bilateral sphenoid sinuses. Other: Bilateral middle ear/mastoid effusions. IMPRESSION: 1. Unchanged position of a left frontal approach ventricular catheter. 2. Hemorrhage within the lateral ventricles (right greater than left), slightly decreased. 3. Ventriculomegaly with unchanged size and configuration of the ventricular system (and likely transependymal interstitial edema). 4. Background parenchymal atrophy, chronic small vessel ischemic  disease and chronic infarcts, as described. 5.  Bilateral middle ear/mastoid effusions. Electronically Signed   By: Rockey Childs D.O.   On: 06/04/2024 15:48    Assessment/Plan: Cerebral hemorrhage, intraventricular hemorrhage, hydrocephalus, ventriculostomy: We will plan to continue the ventriculostomy throughout the weekend and rechallenge her for removal next week.  If she fails she may need a trickle apparently shunt when her spinal fluid clears a bit more.  I have spoken with her mother.  LOS: 18 days     [name] [name] 06/06/2024, 6:52 AM     Patient ID: [name] [name], female   DOB: Jan 02, 1981, 43 y.o.   MRN: [id number]

## 2024-06-07 DIAGNOSIS — G911 Obstructive hydrocephalus: Secondary | ICD-10-CM | POA: Diagnosis not present

## 2024-06-07 DIAGNOSIS — N186 End stage renal disease: Secondary | ICD-10-CM | POA: Diagnosis not present

## 2024-06-07 DIAGNOSIS — E440 Moderate protein-calorie malnutrition: Secondary | ICD-10-CM | POA: Diagnosis not present

## 2024-06-07 DIAGNOSIS — I610 Nontraumatic intracerebral hemorrhage in hemisphere, subcortical: Secondary | ICD-10-CM | POA: Diagnosis not present

## 2024-06-07 LAB — CBC
HCT: 27.5 % — ABNORMAL LOW (ref 36.0–46.0)
Hemoglobin: 8.6 g/dL — ABNORMAL LOW (ref 12.0–15.0)
MCH: 30.3 pg (ref 26.0–34.0)
MCHC: 31.3 g/dL (ref 30.0–36.0)
MCV: 96.8 fL (ref 80.0–100.0)
Platelets: 173 K/uL (ref 150–400)
RBC: 2.84 MIL/uL — ABNORMAL LOW (ref 3.87–5.11)
RDW: 15.6 % — ABNORMAL HIGH (ref 11.5–15.5)
WBC: 4.2 K/uL (ref 4.0–10.5)
nRBC: 0 % (ref 0.0–0.2)

## 2024-06-07 LAB — COMPREHENSIVE METABOLIC PANEL WITH GFR
ALT: 15 U/L (ref 0–44)
AST: 20 U/L (ref 15–41)
Albumin: 3.2 g/dL — ABNORMAL LOW (ref 3.5–5.0)
Alkaline Phosphatase: 125 U/L (ref 38–126)
Anion gap: 19 — ABNORMAL HIGH (ref 5–15)
BUN: 63 mg/dL — ABNORMAL HIGH (ref 6–20)
CO2: 23 mmol/L (ref 22–32)
Calcium: 11.6 mg/dL — ABNORMAL HIGH (ref 8.9–10.3)
Chloride: 91 mmol/L — ABNORMAL LOW (ref 98–111)
Creatinine, Ser: 3.23 mg/dL — ABNORMAL HIGH (ref 0.44–1.00)
GFR, Estimated: 18 mL/min — ABNORMAL LOW (ref >60)
Glucose, Bld: 131 mg/dL — ABNORMAL HIGH (ref 70–99)
Potassium: 4.6 mmol/L (ref 3.5–5.1)
Sodium: 133 mmol/L — ABNORMAL LOW (ref 135–145)
Total Bilirubin: 0.8 mg/dL (ref 0.0–1.2)
Total Protein: 6.7 g/dL (ref 6.5–8.1)

## 2024-06-07 LAB — GLUCOSE, CAPILLARY
Glucose-Capillary: 135 mg/dL — ABNORMAL HIGH (ref 70–99)
Glucose-Capillary: 146 mg/dL — ABNORMAL HIGH (ref 70–99)
Glucose-Capillary: 207 mg/dL — ABNORMAL HIGH (ref 70–99)
Glucose-Capillary: 182 mg/dL — ABNORMAL HIGH (ref 70–99)
Glucose-Capillary: 126 mg/dL — ABNORMAL HIGH (ref 70–99)
Glucose-Capillary: 143 mg/dL — ABNORMAL HIGH (ref 70–99)

## 2024-06-07 LAB — C-REACTIVE PROTEIN: CRP: 0.6 mg/dL (ref <1.0)

## 2024-06-07 LAB — FOLATE: Folate: >20 ng/mL (ref >5.9)

## 2024-06-07 MED ORDER — CARVEDILOL 12.5 MG PO TABS
25.0000 mg | ORAL_TABLET | ORAL | Status: DC
Start: 2024-06-07 — End: 2024-06-18
  Administered 2024-06-07 – 2024-06-15 (×12): 25 mg
  Filled 2024-06-07 (×13): qty 2

## 2024-06-07 MED ORDER — HYDRALAZINE HCL 50 MG PO TABS
100.0000 mg | ORAL_TABLET | ORAL | Status: DC
  Administered 2024-06-07 – 2024-06-17 (×18): 100 mg
  Filled 2024-06-07 (×21): qty 2

## 2024-06-07 MED ORDER — CLONIDINE HCL 0.2 MG PO TABS
0.2000 mg | ORAL_TABLET | ORAL | Status: DC
  Administered 2024-06-07 – 2024-06-12 (×10): 0.2 mg
  Filled 2024-06-07 (×10): qty 1

## 2024-06-07 MED ORDER — CLONIDINE HCL 0.2 MG PO TABS: 0.3000 mg | ORAL_TABLET | ORAL | Status: DC

## 2024-06-07 NOTE — Progress Notes (Signed)
 NEUROSURGERY PROGRESS NOTE  No acute changes over night. EVD is still pulsatile and draining. ICP seems to be around 10. Will challenge the drain next week and see how she does  Temp:  [98.1 F (36.7 C)-99.9 F (37.7 C)] 99.9 F (37.7 C) (10/04 0800) Pulse Rate:  [92-130] 105 (10/04 0900) Resp:  [16-35] 23 (10/04 0900) BP: (96-161)/(51-74) 146/71 (10/04 0900) SpO2:  [95 %-100 %] 100 % (10/04 0900) Weight:  [50.4 kg] 50.4 kg (10/04 0348)    [name] Chiquita Pean, NP 06/07/2024 10:11 AM

## 2024-06-07 NOTE — Progress Notes (Signed)
 Patient ID: [name] [name], female   DOB: 02-10-1981, 43 y.o.   MRN: [id number] [facility] Progress Note   Subjective:   Not responding No new issues   Objective:   BP (!) 128/54   Pulse (!) 116   Temp 98.6 F (37 C)   Resp (!) 28   Ht 5' 3 (1.6 m)   Wt 45 kg   SpO2 99%   BMI 17.57 kg/m   Physical Exam: Gen: Comfortably resting in bed, unresponsive. +Ventriculostomy drain CVS: Pulse regular tachycardia, S1 and S2 normal Resp: Anteriorly clear to auscultation, no rales/rhonchi Abd: Soft, flat, nontender, bowel sounds normal Ext: No lower extremity edema RUA AVF+bruit   OP HD: NW MWF 3h  B400  55.2kg  2K bath   AVF  Heparin  none Mircera 150 mcg q 2 wks, due 9/24    Assessment/ Plan:   Acute right intracranial hemorrhage: Associated with uncontrolled hypertension and status post ventriculostomy.  Extubated 9/22. Ventriculostomy fluid remains bloody. May need VP shunt. Per NSG.  ESRD/ hx of renal allograft failure: Continue HD on MWF. Next HD Monday. 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 Hyperkalemia: due to tube feeds, changed to low K+ (Nepro) TF's. K+ stable today.  Anemia of esrd: Hb 8-9 range with ongoing overt mild loss from ventriculostomy. Getting darbe 100 mcg sq weekly.  CKD-MBD: Elevated calcium  and phosphorus levels noted. PTH level was low at 29. Suspect ^Ca++ due to immobility.  Nutrition: Ongoing tube feeds, monitor electrolytes/volume status. Hypertension: BP's were very high, then dropped low. Is now getting norvasc , clonidine  and coreg  on non-hd days only.  Volume: under dry wt 5-7kg. Tolerating usually 1- 2 L UF.     Myer Fret  MD  [facility] 06/07/2024, 3:07 PM  Recent Labs  Lab 06/06/24 0533 06/06/24 2016 06/07/24 0902  HGB 8.4*  --  8.6*  ALBUMIN  3.3*  --  3.2*  CALCIUM  12.0* 10.9* 11.6*   CREATININE 4.83* 2.32* 3.23*  K 6.5* 3.9 4.6    Inpatient medications:  amLODipine   10 mg Per Tube Once per day on Sunday Tuesday Thursday Saturday   arformoterol  15 mcg Nebulization BID   busPIRone   5 mg Per Tube BID   carvedilol   25 mg Per Tube 2 times per day on Sunday Tuesday Thursday Saturday   Chlorhexidine  Gluconate Cloth  6 each Topical Q0600   Chlorhexidine  Gluconate Cloth  6 each Topical Q0600   cloNIDine   0.2 mg Per Tube 3 times per day on Sunday Tuesday Thursday Saturday   cycloSPORINE   200 mg Per Tube QPM   cycloSPORINE   225 mg Per Tube Daily   darbepoetin (ARANESP ) injection - DIALYSIS  100 mcg Subcutaneous Q Wed-1800   fiber  1 packet Per Tube TID   heparin  injection (subcutaneous)  5,000 Units Subcutaneous Q12H   hydrALAZINE   100 mg Per Tube 3 times per day on Sunday Tuesday Thursday Saturday   insulin  aspart  0-9 Units Subcutaneous Q4H   lacosamide   100 mg Per Tube BID   multivitamin  15 mL Per Tube Daily   mycophenolate   500 mg Per Tube BID   mouth rinse  15 mL Mouth Rinse 4 times per day   pantoprazole  (PROTONIX ) IV  40 mg Intravenous QHS   predniSONE   15 mg Per Tube Q breakfast    anticoagulant sodium citrate      feeding  supplement (NEPRO CARB STEADY) 55 mL/hr at 06/07/24 1400   piperacillin -tazobactam (ZOSYN )  IV 100 mL/hr at 06/07/24 1400   acetaminophen  **OR** acetaminophen  (TYLENOL ) oral liquid 160 mg/5 mL **OR** acetaminophen , alteplase , anticoagulant sodium citrate , heparin , labetalol , lidocaine  (PF), lidocaine -prilocaine , midodrine , mouth rinse, pentafluoroprop-tetrafluoroeth, sennosides

## 2024-06-07 NOTE — Progress Notes (Signed)
 NAME:  [name] [name], MRN:  [id number], DOB:  [birth year], LOS: 19 ADMISSION DATE:  05/19/2024 CONSULTATION DATE:  9/15/20025 REFERRING MD: Patsey - EDP, CHIEF COMPLAINT:  Code Stroke, ICH   History of Present Illness:  43 year old woman who presented to [facility] ED 9/15 as a Code Stroke. 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 Recent admissions 6/11-7/28 and 7/29-8/13 for ICH.  Patient presented to [facility] ED from dialysis as a Code Stroke; reportedly LKW 1450 with development of obtundation and hemiparesis. SBP 200s. NIHSS 31 with initial leftward gaze, decreased LOC/disorientation, bilateral facial droop/arm weakness/leg weakness, global aphasia, dysarthria, visual neglect. CT Head demonstrated large volume IVH with moderate hydrocephalus, acute hemorrhage of medial R temporal lobe and small foci of residual/recurrent hemorrhage in the L cerebellar hemisphere.   Patient was seen by stroke team and neurosurgery, EVD was placed on left side, PCCM was consulted for medical evaluation and admission to neuro ICU  Pertinent Medical History:   Past Medical History:  Diagnosis Date   Anemia    Anxiety    DM (diabetes mellitus), type 1 (HCC)    ESRD on hemodialysis (HCC)    Gastroparesis    History of simultaneous kidney and pancreas transplant (HCC) 2014   Hypertension    Ischemic cerebrovascular accident (CVA) (HCC) 11/2021   Narcotic abuse (HCC)    Non compliance with medical treatment    Stroke [facility])    Vision loss    Significant Hospital Events: Including procedures, antibiotic start and stop dates in addition to other pertinent events   9/15 admitted with ICH plus IVH, EVD was placed for hydrocephalus 9/16 remain on clevidipine  infusion, tolerating spontaneous breathing trial, EVD is draining bloody fluid 9/17 continue to  require clevidipine  infusion, tolerated spontaneous breathing trial 9/18: No overnight issues. Currently off sedation. Still on high-dose `clevidipine  infusion 9/19: this am, dialysis ongoing. Neurosx plans to continue ventriculostomy for now. Remains on cleviprex  to maintain BP goal. Tmax 100.1. despite plt decrease 4T score 0. Will maintain heparin  as is for now and closely follow trend.  9/27 Spiking low grade fevers, wheezing on exam, CXR normal  9/30 No tolerating when EVD is raised. Still bloody fluid. CT head will be done. 10/1 -developed fevers pancultures drawn and broad-spectrum antibiotics started 10/2 became afebrile after starting antibiotics, did not tolerate raising EVD 10/3 serum potassium 6.5, receiving hemodialysis, no overnight issues.  Did not tolerate weaning EVD  Interim History / Subjective:  Received hemodialysis yesterday, repeat serum potassium is 3.9 Remained afebrile No overnight issues  Objective:  Blood pressure (!) 161/73, pulse (!) 102, temperature 99.9 F (37.7 C), temperature source Axillary, resp. rate (!) 21, height 5' 3 (1.6 m), weight 50.4 kg, SpO2 100%.        Intake/Output Summary (Last 24 hours) at 06/07/2024 0855 Last data filed at 06/07/2024 0800 Gross per 24 hour  Intake 1469 ml  Output 1588 ml  Net -119 ml   Filed Weights   06/06/24 0500 06/06/24 0900 06/07/24 0348  Weight: 49.1 kg 49.2 kg 50.4 kg   Physical Examination: General: Acute on chronically ill-appearing female, lying on the bed HEENT: [place]/AT, eyes anicteric.  moist mucus membranes Neuro: Awake, tracking examiner, not following commands, plegic left upper extremity, antigravity on right upper extremity Chest: Coarse breath sounds, no wheezes or rhonchi Heart: Regular rate and rhythm, no murmurs or gallops Abdomen:  Soft, nontender, nondistended, bowel sounds present   Labs reviewed  Patient Lines/Drains/Airways Status     Active Line/Drains/Airways     Name Placement  date Placement time Site Days   Peripheral IV 05/19/24 22 G 2.5 Left;Anterior Forearm 05/19/24  2251  Forearm  19   Fistula / Graft Right Upper arm Arteriovenous fistula 12/08/20  1403  Upper arm  1277   Gastrostomy/Enterostomy PEG-jejunostomy 18 Fr. LUQ 05/21/24  0827  LUQ  17   ICP/Ventriculostomy Ventricular drainage catheter Left Parietal region 05/19/24  1730  Parietal region  19   Fecal Management System 40 mL 06/02/24  1700  -- 5   Wound 05/19/24 1933 Pressure Injury Buttocks Bilateral Stage 1 -  Intact skin with non-blanchable redness of a localized area usually over a bony prominence. 05/19/24  1933  Buttocks  19          Resolved Hospital problems  Hypertensive emergency, resolved Acute respiratory insufficiency  thrombocytopenia of critical illness Serratia/klebsiella pneumonia Hyperkalemia  Assessment & Plan:  Acute right parietotemporal intraparenchymal hemorrhage with IVH with ICH score 2 due to uncontrolled hypertension Obstructive hydrocephalus status post EVD Acute encephalopathy in the setting of ICH and hydrocephalus End-stage renal disease on hemodialysis with failed renal transplant but functional pancreatic transplant on immunosuppressive therapy Hypervolemic hyponatremia, hypochloremia  Type 1 diabetes, complicated with vasculopathy and gastroparesis- not on long term insulin  anymore Narcotic abuse Moderate protein calorie malnutrition Anemia of chronic disease Stage 1 pressure ulcer POA New onset low grade fevers, unclear source, on broad-spectrum antibiotics Immunosuppression for pancreas kidney transplant  Continue neuro watch She remained aphasic, not following commands, plegic in left upper extremity Neurosurgery is following, recommend reevaluation next week for possible EVD weaning versus VP shunt placement Cultures have been negative She became afebrile after reinitiation of antibiotic therapy Continue Vanco and Zosyn  to complete 5 days  therapy Closely monitor electrolytes Continue hemodialysis per schedule, nephrology is following Continue sliding scale insulin  with CBG goal 140-180 Monitor H&H and platelet counts Continue prednisone  and cyclosporine , home medications for kidney and pancreatic transplant Continue oral antihypertensive meds Continue wound care    Valinda Novas, MD [facility] See Amion for pager If no response to pager, please call [phone] until 7pm After 7pm, Please call E-link [phone]

## 2024-06-08 DIAGNOSIS — I610 Nontraumatic intracerebral hemorrhage in hemisphere, subcortical: Secondary | ICD-10-CM | POA: Diagnosis not present

## 2024-06-08 DIAGNOSIS — G911 Obstructive hydrocephalus: Secondary | ICD-10-CM | POA: Diagnosis not present

## 2024-06-08 DIAGNOSIS — N186 End stage renal disease: Secondary | ICD-10-CM | POA: Diagnosis not present

## 2024-06-08 DIAGNOSIS — E440 Moderate protein-calorie malnutrition: Secondary | ICD-10-CM | POA: Diagnosis not present

## 2024-06-08 LAB — COMPREHENSIVE METABOLIC PANEL WITH GFR
ALT: 13 U/L (ref 0–44)
AST: 19 U/L (ref 15–41)
Albumin: 2.9 g/dL — ABNORMAL LOW (ref 3.5–5.0)
Alkaline Phosphatase: 134 U/L — ABNORMAL HIGH (ref 38–126)
Anion gap: 19 — ABNORMAL HIGH (ref 5–15)
BUN: 94 mg/dL — ABNORMAL HIGH (ref 6–20)
CO2: 23 mmol/L (ref 22–32)
Calcium: 11.7 mg/dL — ABNORMAL HIGH (ref 8.9–10.3)
Chloride: 90 mmol/L — ABNORMAL LOW (ref 98–111)
Creatinine, Ser: 4.42 mg/dL — ABNORMAL HIGH (ref 0.44–1.00)
GFR, Estimated: 12 mL/min — ABNORMAL LOW (ref >60)
Glucose, Bld: 140 mg/dL — ABNORMAL HIGH (ref 70–99)
Potassium: 4.6 mmol/L (ref 3.5–5.1)
Sodium: 132 mmol/L — ABNORMAL LOW (ref 135–145)
Total Bilirubin: 0.7 mg/dL (ref 0.0–1.2)
Total Protein: 6.5 g/dL (ref 6.5–8.1)

## 2024-06-08 LAB — CBC
HCT: 26.8 % — ABNORMAL LOW (ref 36.0–46.0)
Hemoglobin: 8.6 g/dL — ABNORMAL LOW (ref 12.0–15.0)
MCH: 30.9 pg (ref 26.0–34.0)
MCHC: 32.1 g/dL (ref 30.0–36.0)
MCV: 96.4 fL (ref 80.0–100.0)
Platelets: 164 K/uL (ref 150–400)
RBC: 2.78 MIL/uL — ABNORMAL LOW (ref 3.87–5.11)
RDW: 15.8 % — ABNORMAL HIGH (ref 11.5–15.5)
WBC: 4.2 K/uL (ref 4.0–10.5)
nRBC: 0 % (ref 0.0–0.2)

## 2024-06-08 LAB — GLUCOSE, CAPILLARY
Glucose-Capillary: 136 mg/dL — ABNORMAL HIGH (ref 70–99)
Glucose-Capillary: 153 mg/dL — ABNORMAL HIGH (ref 70–99)
Glucose-Capillary: 182 mg/dL — ABNORMAL HIGH (ref 70–99)
Glucose-Capillary: 202 mg/dL — ABNORMAL HIGH (ref 70–99)
Glucose-Capillary: 146 mg/dL — ABNORMAL HIGH (ref 70–99)
Glucose-Capillary: 141 mg/dL — ABNORMAL HIGH (ref 70–99)

## 2024-06-08 MED ORDER — CHLORHEXIDINE GLUCONATE CLOTH 2 % EX PADS: 6.0000 | MEDICATED_PAD | Freq: Every day | CUTANEOUS | Status: DC

## 2024-06-08 NOTE — Progress Notes (Signed)
 NAME:  [name] [name], MRN:  [id number], DOB:  02-21-1981, LOS: 20 ADMISSION DATE:  05/19/2024 CONSULTATION DATE:  9/15/20025 REFERRING MD: Patsey - EDP, CHIEF COMPLAINT:  Code Stroke, ICH   History of Present Illness:  43 year old woman who presented to [facility] ED 9/15 as a Code Stroke. 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 Recent admissions 6/11-7/28 and 7/29-8/13 for ICH.  Patient presented to [facility] ED from dialysis as a Code Stroke; reportedly LKW 1450 with development of obtundation and hemiparesis. SBP 200s. NIHSS 31 with initial leftward gaze, decreased LOC/disorientation, bilateral facial droop/arm weakness/leg weakness, global aphasia, dysarthria, visual neglect. CT Head demonstrated large volume IVH with moderate hydrocephalus, acute hemorrhage of medial R temporal lobe and small foci of residual/recurrent hemorrhage in the L cerebellar hemisphere.   Patient was seen by stroke team and neurosurgery, EVD was placed on left side, PCCM was consulted for medical evaluation and admission to neuro ICU  Pertinent Medical History:   Past Medical History:  Diagnosis Date   Anemia    Anxiety    DM (diabetes mellitus), type 1 (HCC)    ESRD on hemodialysis (HCC)    Gastroparesis    History of simultaneous kidney and pancreas transplant (HCC) 2014   Hypertension    Ischemic cerebrovascular accident (CVA) (HCC) 11/2021   Narcotic abuse (HCC)    Non compliance with medical treatment    Stroke [facility])    Vision loss    Significant Hospital Events: Including procedures, antibiotic start and stop dates in addition to other pertinent events   9/15 admitted with ICH plus IVH, EVD was placed for hydrocephalus 9/16 remain on clevidipine  infusion, tolerating spontaneous breathing trial, EVD is draining bloody fluid 9/17 continue to  require clevidipine  infusion, tolerated spontaneous breathing trial 9/18: No overnight issues. Currently off sedation. Still on high-dose `clevidipine  infusion 9/19: this am, dialysis ongoing. Neurosx plans to continue ventriculostomy for now. Remains on cleviprex  to maintain BP goal. Tmax 100.1. despite plt decrease 4T score 0. Will maintain heparin  as is for now and closely follow trend.  9/27 Spiking low grade fevers, wheezing on exam, CXR normal  9/30 No tolerating when EVD is raised. Still bloody fluid. CT head will be done. 10/1 -developed fevers pancultures drawn and broad-spectrum antibiotics started 10/2 became afebrile after starting antibiotics, did not tolerate raising EVD 10/3 serum potassium 6.5, receiving hemodialysis, no overnight issues.  Did not tolerate weaning EVD 10/4 EVD remain at 10, no issues  Interim History / Subjective:  No overnight issues, EVD remains at 10 cm water  Remain on room air  Objective:  Blood pressure 137/63, pulse 97, temperature 99.4 F (37.4 C), temperature source Axillary, resp. rate (!) 27, height 5' 3 (1.6 m), weight 52 kg, SpO2 100%.        Intake/Output Summary (Last 24 hours) at 06/08/2024 0853 Last data filed at 06/08/2024 0700 Gross per 24 hour  Intake 1387.08 ml  Output 436 ml  Net 951.08 ml   Filed Weights   06/06/24 0900 06/07/24 0348 06/08/24 0358  Weight: 49.2 kg 50.4 kg 52 kg   Physical Examination: General: Chronically ill-appearing female, lying on the bed HEENT: [place]/AT, eyes anicteric.  moist mucus membranes.  EVD in place Neuro: Sleepy, opens eyes to painful stimuli, remain aphasic.  Plegic on left upper extremity, antigravity on right upper extremity, withdrawing in bilateral lower extremities Chest: Coarse  breath sounds, no wheezes or rhonchi Heart: Regular rate and rhythm, no murmurs or gallops Abdomen: Soft, nontender, nondistended, bowel sounds present   Labs reviewed  Patient Lines/Drains/Airways Status      Active Line/Drains/Airways     Name Placement date Placement time Site Days   Peripheral IV 06/08/24 22 G 1.75 Anterior;Left Forearm 06/08/24  0612  Forearm  less than 1   Fistula / Graft Right Upper arm Arteriovenous fistula 12/08/20  1403  Upper arm  1278   Gastrostomy/Enterostomy PEG-jejunostomy 18 Fr. LUQ 05/21/24  0827  LUQ  18   ICP/Ventriculostomy Ventricular drainage catheter Left Parietal region 05/19/24  1730  Parietal region  20   Fecal Management System 40 mL 06/02/24  1700  -- 6   Wound 05/19/24 1933 Pressure Injury Buttocks Bilateral Stage 1 -  Intact skin with non-blanchable redness of a localized area usually over a bony prominence. 05/19/24  1933  Buttocks  20         Resolved Hospital problems  Hypertensive emergency, resolved Acute respiratory insufficiency  thrombocytopenia of critical illness Serratia/klebsiella pneumonia Hyperkalemia  Assessment & Plan:  Acute right parietotemporal intraparenchymal hemorrhage with IVH with ICH score 2 due to uncontrolled hypertension Obstructive hydrocephalus status post EVD Acute encephalopathy in the setting of ICH and hydrocephalus End-stage renal disease on hemodialysis with failed renal transplant but functional pancreatic transplant on immunosuppressive therapy Hypervolemic hyponatremia, hypochloremia  Type 1 diabetes, complicated with vasculopathy and gastroparesis- not on long term insulin  anymore post pancreatic transplant Narcotic abuse Moderate protein calorie malnutrition Anemia of chronic disease Stage 1 pressure ulcer POA New onset low grade fevers, unclear source, on broad-spectrum antibiotics Immunosuppression for pancreas kidney transplant  Patient is neurologically stable Aphasic with plegic left upper extremity, not following commands EVD remains at 10 cm water , will try to wean EVD next week, if fails, she may need VP shunt Remain afebrile Continue vancomycin  and Zosyn  to complete 5-day  therapy Continue immunosuppressive therapy with cyclosporine  and prednisone  Closely monitor electrolytes Blood sugars are controlled Continue dietary supplements with tube feeds Monitor H&H Continue wound care No more fevers Continue oral antihypertensive meds     Valinda Novas, MD [facility] See Amion for pager If no response to pager, please call [phone] until 7pm After 7pm, Please call E-link [phone]

## 2024-06-08 NOTE — Progress Notes (Signed)
  NEUROSURGERY PROGRESS NOTE   No issues overnight.   EXAM:  BP 137/63 (BP Location: Left Arm)   Pulse 97   Temp 99.4 F (37.4 C) (Axillary)   Resp (!) 27   Ht 5' 3 (1.6 m)   Wt 52 kg   SpO2 100%   BMI 20.31 kg/m   Arouses easily Moves RUE spontaneously EVD in place, patent c 130cc x 24hrs  IMPRESSION:  43 y.o. female with large ICH/IVH, may require more permanent CSF diversion ESRD  PLAN: - Cont EVD open at 10cm today, plan is to attempt to wean EVD again this week. - Cont supportive care per PCCM/Nephro   Gerldine Maizes, MD [facility]

## 2024-06-08 NOTE — Progress Notes (Signed)
 Patient ID: [name] [name], female   DOB: 1981-02-17, 43 y.o.   MRN: [id number] [facility] Progress Note   Subjective:   Not responding No new issues   Objective:   BP (!) 128/54   Pulse (!) 116   Temp 98.6 F (37 C)   Resp (!) 28   Ht 5' 3 (1.6 m)   Wt 45 kg   SpO2 99%   BMI 17.57 kg/m   Physical Exam: Gen: resting in bed, unresponsive. +Ventriculostomy drain CVS: Pulse regular tachycardia, S1 and S2 normal Resp: Anteriorly clear to auscultation, no rales/rhonchi Abd: Soft, flat, nontender, bowel sounds normal Ext: No lower extremity edema RUA AVF+bruit   OP HD: NW MWF 3h  B400  55.2kg  2K bath   AVF  Heparin  none Mircera 150 mcg q 2 wks, due 9/24    Assessment/ Plan:   Acute right intracranial hemorrhage: Associated with uncontrolled hypertension and status post ventriculostomy.  Extubated 9/22. Ventriculostomy fluid remains bloody. May need VP shunt. Per NSG.  ESRD/ hx of renal allograft failure: Continue HD on MWF. Next HD Monday. 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 Hyperkalemia: due to tube feeds, changed to low K+ (Nepro) TF's. K+ stable today.  Anemia of esrd: Hb 8-9 range. Getting darbe 100 mcg sq weekly.  CKD-MBD: Elevated calcium  and phosphorus levels noted. PTH level was low at 29. Suspect ^Ca++ due to immobility.  Nutrition: Ongoing tube feeds, monitor electrolytes/volume status. Hypertension: BP's were very high, then dropped low. Is now getting norvasc , clonidine  and coreg  on non-hd days only.  Volume: under dry wt 3-4kg, wts coming up, exam unchanged. UF 2-2.5 w/ next HD.     Myer Fret  MD  [facility] 06/08/2024, 11:33 AM  Recent Labs  Lab 06/07/24 0902 06/08/24 0425  HGB 8.6* 8.6*  ALBUMIN  3.2* 2.9*  CALCIUM  11.6* 11.7*  CREATININE 3.23* 4.42*  K 4.6 4.6    Inpatient medications:   amLODipine   10 mg Per Tube Once per day on Sunday Tuesday Thursday Saturday   arformoterol  15 mcg Nebulization BID   busPIRone   5 mg Per Tube BID   carvedilol   25 mg Per Tube 2 times per day on Sunday Tuesday Thursday Saturday   Chlorhexidine  Gluconate Cloth  6 each Topical Q0600   Chlorhexidine  Gluconate Cloth  6 each Topical Q0600   cloNIDine   0.2 mg Per Tube 3 times per day on Sunday Tuesday Thursday Saturday   cycloSPORINE   200 mg Per Tube QPM   cycloSPORINE   225 mg Per Tube Daily   darbepoetin (ARANESP ) injection - DIALYSIS  100 mcg Subcutaneous Q Wed-1800   fiber  1 packet Per Tube TID   heparin  injection (subcutaneous)  5,000 Units Subcutaneous Q12H   hydrALAZINE   100 mg Per Tube 3 times per day on Sunday Tuesday Thursday Saturday   insulin  aspart  0-9 Units Subcutaneous Q4H   lacosamide   100 mg Per Tube BID   multivitamin  15 mL Per Tube Daily   mycophenolate   500 mg Per Tube BID   mouth rinse  15 mL Mouth Rinse 4 times per day   pantoprazole  (PROTONIX ) IV  40 mg Intravenous QHS   predniSONE   15 mg Per Tube Q breakfast    anticoagulant sodium citrate      feeding supplement (NEPRO CARB STEADY) 1,000 mL (06/08/24 0907)   piperacillin -tazobactam (ZOSYN )  IV Stopped (  06/08/24 0529)   acetaminophen  **OR** acetaminophen  (TYLENOL ) oral liquid 160 mg/5 mL **OR** acetaminophen , alteplase , anticoagulant sodium citrate , heparin , labetalol , lidocaine  (PF), lidocaine -prilocaine , midodrine , mouth rinse, pentafluoroprop-tetrafluoroeth, sennosides

## 2024-06-09 DIAGNOSIS — G911 Obstructive hydrocephalus: Secondary | ICD-10-CM | POA: Diagnosis not present

## 2024-06-09 DIAGNOSIS — N186 End stage renal disease: Secondary | ICD-10-CM | POA: Diagnosis not present

## 2024-06-09 DIAGNOSIS — I610 Nontraumatic intracerebral hemorrhage in hemisphere, subcortical: Secondary | ICD-10-CM | POA: Diagnosis not present

## 2024-06-09 DIAGNOSIS — E440 Moderate protein-calorie malnutrition: Secondary | ICD-10-CM | POA: Diagnosis not present

## 2024-06-09 LAB — GLUCOSE, CAPILLARY
Glucose-Capillary: 151 mg/dL — ABNORMAL HIGH (ref 70–99)
Glucose-Capillary: 162 mg/dL — ABNORMAL HIGH (ref 70–99)
Glucose-Capillary: 186 mg/dL — ABNORMAL HIGH (ref 70–99)
Glucose-Capillary: 234 mg/dL — ABNORMAL HIGH (ref 70–99)
Glucose-Capillary: 158 mg/dL — ABNORMAL HIGH (ref 70–99)
Glucose-Capillary: 165 mg/dL — ABNORMAL HIGH (ref 70–99)

## 2024-06-09 LAB — CBC
HCT: 24.1 % — ABNORMAL LOW (ref 36.0–46.0)
Hemoglobin: 7.8 g/dL — ABNORMAL LOW (ref 12.0–15.0)
MCH: 31.3 pg (ref 26.0–34.0)
MCHC: 32.4 g/dL (ref 30.0–36.0)
MCV: 96.8 fL (ref 80.0–100.0)
Platelets: 146 K/uL — ABNORMAL LOW (ref 150–400)
RBC: 2.49 MIL/uL — ABNORMAL LOW (ref 3.87–5.11)
RDW: 15.8 % — ABNORMAL HIGH (ref 11.5–15.5)
WBC: 4.2 K/uL (ref 4.0–10.5)
nRBC: 0 % (ref 0.0–0.2)

## 2024-06-09 LAB — CULTURE, BLOOD (ROUTINE X 2)
Culture: NO GROWTH
Culture: NO GROWTH
Special Requests: ADEQUATE
Special Requests: ADEQUATE

## 2024-06-09 NOTE — Progress Notes (Signed)
 Hd Tx start/ mom at bedside

## 2024-06-09 NOTE — Progress Notes (Signed)
Pre Hd 

## 2024-06-09 NOTE — Progress Notes (Addendum)
 NAME:  [name] [name], MRN:  [id number], DOB:  1981/03/31, LOS: 21 ADMISSION DATE:  05/19/2024 CONSULTATION DATE:  9/15/20025 REFERRING MD: Patsey - EDP, CHIEF COMPLAINT:  Code Stroke, ICH   History of Present Illness:  43 year old woman who presented to [facility] ED 9/15 as a Code Stroke. 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 Recent admissions 6/11-7/28 and 7/29-8/13 for ICH.  Patient presented to [facility] ED from dialysis as a Code Stroke; reportedly LKW 1450 with development of obtundation and hemiparesis. SBP 200s. NIHSS 31 with initial leftward gaze, decreased LOC/disorientation, bilateral facial droop/arm weakness/leg weakness, global aphasia, dysarthria, visual neglect. CT Head demonstrated large volume IVH with moderate hydrocephalus, acute hemorrhage of medial R temporal lobe and small foci of residual/recurrent hemorrhage in the L cerebellar hemisphere.   Patient was seen by stroke team and neurosurgery, EVD was placed on left side, PCCM was consulted for medical evaluation and admission to neuro ICU  Pertinent Medical History:   Past Medical History:  Diagnosis Date   Anemia    Anxiety    DM (diabetes mellitus), type 1 (HCC)    ESRD on hemodialysis (HCC)    Gastroparesis    History of simultaneous kidney and pancreas transplant (HCC) 2014   Hypertension    Ischemic cerebrovascular accident (CVA) (HCC) 11/2021   Narcotic abuse (HCC)    Non compliance with medical treatment    Stroke [facility])    Vision loss    Significant Hospital Events: Including procedures, antibiotic start and stop dates in addition to other pertinent events   9/15 admitted with ICH plus IVH, EVD was placed for hydrocephalus 9/16 remain on clevidipine  infusion, tolerating spontaneous breathing trial, EVD is draining bloody fluid 9/17 continue to  require clevidipine  infusion, tolerated spontaneous breathing trial 9/18: No overnight issues. Currently off sedation. Still on high-dose `clevidipine  infusion 9/19: this am, dialysis ongoing. Neurosx plans to continue ventriculostomy for now. Remains on cleviprex  to maintain BP goal. Tmax 100.1. despite plt decrease 4T score 0. Will maintain heparin  as is for now and closely follow trend.  9/27 Spiking low grade fevers, wheezing on exam, CXR normal  9/30 No tolerating when EVD is raised. Still bloody fluid. CT head will be done. 10/1 -developed fevers pancultures drawn and broad-spectrum antibiotics started 10/2 became afebrile after starting antibiotics, did not tolerate raising EVD 10/3 serum potassium 6.5, receiving hemodialysis, no overnight issues.  Did not tolerate weaning EVD 10/4 EVD remain at 10, no issues 10/5 EVD remains at 10, afebrile  Interim History / Subjective:  No overnight events Remained afebrile  Objective:  Blood pressure 128/60, pulse 84, temperature 97.7 F (36.5 C), temperature source Axillary, resp. rate (!) 25, height 5' 3 (1.6 m), weight 53.7 kg, SpO2 100%.    FiO2 (%):  [40 %] 40 %   Intake/Output Summary (Last 24 hours) at 06/09/2024 0842 Last data filed at 06/09/2024 0600 Gross per 24 hour  Intake 1415 ml  Output 131 ml  Net 1284 ml   Filed Weights   06/07/24 0348 06/08/24 0358 06/09/24 0351  Weight: 50.4 kg 52 kg 53.7 kg   Physical Examination: General: Chronically ill-appearing middle-aged female, lying on the bed HEENT: [place]/AT, eyes anicteric.  moist mucus membranes.  EVD in place, currently at 10 cm water  Neuro: Awake, intermittently following commands, saying few words, antigravity in all 4 extremities Chest: Coarse breath sounds, no  wheezes or rhonchi Heart: Regular rate and rhythm, no murmurs or gallops Abdomen: Soft, nontender, nondistended, bowel sounds present   Patient Lines/Drains/Airways Status     Active Line/Drains/Airways      Name Placement date Placement time Site Days   Peripheral IV 06/08/24 22 G 1.75 Anterior;Left Forearm 06/08/24  0612  Forearm  1   Fistula / Graft Right Upper arm Arteriovenous fistula 12/08/20  1403  Upper arm  1279   Gastrostomy/Enterostomy PEG-jejunostomy 18 Fr. LUQ 05/21/24  0827  LUQ  19   ICP/Ventriculostomy Ventricular drainage catheter Left Parietal region 05/19/24  1730  Parietal region  21   Fecal Management System 40 mL 06/02/24  1700  -- 7   Wound 05/19/24 1933 Pressure Injury Buttocks Bilateral Stage 1 -  Intact skin with non-blanchable redness of a localized area usually over a bony prominence. 05/19/24  1933  Buttocks  21         Resolved Hospital problems  Hypertensive emergency, resolved Acute respiratory insufficiency  thrombocytopenia of critical illness Serratia/klebsiella pneumonia Hyperkalemia Acute encephalopathy in the setting of ICH and hydrocephalus  Assessment & Plan:  Acute right parietotemporal intraparenchymal hemorrhage with IVH with ICH score 2 due to uncontrolled hypertension Obstructive hydrocephalus status post EVD, currently at 10 cm water  End-stage renal disease on hemodialysis with failed renal transplant but functional pancreatic transplant on immunosuppressive therapy Hypervolemic hyponatremia, hypochloremia  Type 1 diabetes, complicated with vasculopathy and gastroparesis- not on long term insulin  anymore post pancreatic transplant Narcotic abuse Moderate protein calorie malnutrition Anemia of chronic disease Stage 1 pressure ulcer POA New onset low grade fevers, unclear source, on broad-spectrum antibiotics Immunosuppression for pancreas kidney transplant Seizure disorder Hypercalcemia   Patient is neurologically stable, started saying few words EVD remains at 10 cm water , awaiting neurosurgery recommendations to see if we can start weaning EVD as she did not tolerate weaning last week Remain afebrile Completed 5-day course of  antibiotic therapy Continue immunosuppressive therapy with cyclosporine , mycophenolate  and prednisone  Closely monitor electrolytes Blood sugars are controlled Continue dietary supplements with tube feeds Monitor H&H Continue wound care Continue oral antihypertensive meds Continue Vimpat      Valinda Novas, MD [facility] See Amion for pager If no response to pager, please call [phone] until 7pm After 7pm, Please call E-link [phone]

## 2024-06-09 NOTE — Progress Notes (Signed)
Tx end 

## 2024-06-09 NOTE — Progress Notes (Signed)
 Subjective: The patient's mother is at the bedside.  She tells me her daughter has spoken a few words today.  Objective: Vital signs in last 24 hours: Temp:  [97.5 F (36.4 C)-98.9 F (37.2 C)] 97.7 F (36.5 C) (10/06 0700) Pulse Rate:  [83-93] 88 (10/06 1000) Resp:  [18-31] 18 (10/06 1000) BP: (101-140)/(48-67) 139/64 (10/06 1000) SpO2:  [99 %-100 %] 100 % (10/06 1000) FiO2 (%):  [40 %] 40 % (10/05 1946) Weight:  [53.7 kg] 53.7 kg (10/06 0351) Estimated body mass index is 20.97 kg/m as calculated from the following:   Height as of this encounter: 5' 3 (1.6 m).   Weight as of this encounter: 53.7 kg.   Intake/Output from previous day: 10/05 0701 - 10/06 0700 In: 1415 [NG/GT:1265; IV Piggyback:150] Out: 140 [Urine:36; Drains:104] Intake/Output this shift: Total I/O In: -  Out: 17 [Drains:17]  Physical exam the patient is somnolent but arousable.  Her ventriculostomy is patent.  Lab Results: Recent Labs    06/08/24 0425 06/09/24 0640  WBC 4.2 4.2  HGB 8.6* 7.8*  HCT 26.8* 24.1*  PLT 164 146*   BMET Recent Labs    06/07/24 0902 06/08/24 0425  NA 133* 132*  K 4.6 4.6  CL 91* 90*  CO2 23 23  GLUCOSE 131* 140*  BUN 63* 94*  CREATININE 3.23* 4.42*  CALCIUM  11.6* 11.7*    Studies/Results: No results found.  Assessment/Plan: Intracerebral hemorrhage, intraventricular hemorrhage, hydrocephalus, ventriculostomy: I have again discussed the situation with the patient's mother.  We are going to raise the ventriculostomy to 30 cm of water .  I will plan to repeat her CAT scan on Wednesday.  Hopefully we can remove the ventriculostomy.  If not we will send CSF studies and consider a ventriculoperitoneal shunt if her spinal fluid has cleared enough.  LOS: 21 days     [name] [name] 06/09/2024, 10:32 AM     Patient ID: [name] [name], female   DOB: [birth year], 43 y.o.   MRN: [id number]

## 2024-06-09 NOTE — Progress Notes (Signed)
 Patient ID: [name] [name], female   DOB: [birth year], 43 y.o.   MRN: [id number] [facility] Progress Note   Subjective:    Seen in room.  Mom at bedside.  Pt spoke to PCCM MD, sleeping now.     Objective:   BP (!) 128/54   Pulse (!) 116   Temp 98.6 F (37 C)   Resp (!) 28   Ht 5' 3 (1.6 m)   Wt 45 kg   SpO2 99%   BMI 17.57 kg/m   Physical Exam: Gen: Comfortably resting in bed, unresponsive. +Ventriculostomy drain, still with a little blood in it. CVS: Pulse regular tachycardia, S1 and S2 normal Resp: Anteriorly clear to auscultation, no rales/rhonchi Abd: Soft, flat, nontender, bowel sounds normal Ext: No lower extremity edema RUA AVF+bruit   OP HD: NW MWF 3h  B400  55.2kg  2K bath   AVF  Heparin  none Mircera 150 mcg q 2 wks, due 9/24    Assessment/ Plan:    OP HD: NW MWF 3h  B400  55.2kg  2K bath   AVF  Heparin  none Mircera 150 mcg q 2 wks, due 9/24  Acute right intracranial hemorrhage: Associated with uncontrolled hypertension and status post ventriculostomy.  Extubated 9/22. Ventriculostomy fluid remains bloody. May need VP shunt. Per NSG.  ESRD/ hx of renal allograft failure: Continue HD on MWF. Next HD Monday. 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 Getting darbe 100 mcg sq weekly.  CKD-MBD: Elevated calcium  and phosphorus levels noted. PTH level was low at 29. Suspect ^Ca++ due to immobility.  Nutrition: Ongoing tube feeds, monitor electrolytes/volume status. Hypertension: BP's were very high, then dropped low. Is now getting norvasc , clonidine  and coreg  on non-hd days only.  Volume: under dry wt 5-7kg. Tolerating usually 1- 2 L UF.  Dispo: spoke with mom-  full scope care, full code.  She is hopeful for pt to get another transplant.   [name] Bonine MD 06/09/2024, 12:34 PM  Recent Labs  Lab 06/07/24 0902 06/08/24 0425 06/09/24 0640  HGB 8.6* 8.6* 7.8*  ALBUMIN  3.2* 2.9*  --   CALCIUM  11.6* 11.7*  --   CREATININE 3.23* 4.42*  --   K 4.6 4.6  --     Inpatient medications:  amLODipine   10 mg Per Tube Once per day on Sunday Tuesday Thursday Saturday   arformoterol  15 mcg Nebulization BID   carvedilol   25 mg Per Tube 2 times per day on Sunday Tuesday Thursday Saturday   Chlorhexidine  Gluconate Cloth  6 each Topical Q0600   cloNIDine   0.2 mg Per Tube 3 times per day on Sunday Tuesday Thursday Saturday   cycloSPORINE   200 mg Per Tube QPM   cycloSPORINE   225 mg Per Tube Daily   darbepoetin (ARANESP ) injection - DIALYSIS  100 mcg Subcutaneous Q Wed-1800   fiber  1 packet Per Tube TID   heparin  injection (subcutaneous)  5,000 Units Subcutaneous Q12H   hydrALAZINE   100 mg Per Tube 3 times per day on Sunday Tuesday Thursday Saturday   insulin  aspart  0-9 Units Subcutaneous Q4H   lacosamide   100 mg Per Tube BID   multivitamin  15 mL Per Tube Daily   mycophenolate   500 mg Per Tube  BID   mouth rinse  15 mL Mouth Rinse 4 times per day   pantoprazole  (PROTONIX ) IV  40 mg Intravenous QHS   predniSONE   15 mg Per Tube Q breakfast    anticoagulant sodium citrate      feeding supplement (NEPRO CARB STEADY) 55 mL/hr at 06/09/24 0600   acetaminophen  **OR** acetaminophen  (TYLENOL ) oral liquid 160 mg/5 mL **OR** acetaminophen , alteplase , anticoagulant sodium citrate , heparin , labetalol , lidocaine  (PF), lidocaine -prilocaine , midodrine , mouth rinse, pentafluoroprop-tetrafluoroeth, sennosides

## 2024-06-10 DIAGNOSIS — N186 End stage renal disease: Secondary | ICD-10-CM | POA: Diagnosis not present

## 2024-06-10 DIAGNOSIS — G911 Obstructive hydrocephalus: Secondary | ICD-10-CM | POA: Diagnosis not present

## 2024-06-10 DIAGNOSIS — E440 Moderate protein-calorie malnutrition: Secondary | ICD-10-CM | POA: Diagnosis not present

## 2024-06-10 DIAGNOSIS — I610 Nontraumatic intracerebral hemorrhage in hemisphere, subcortical: Secondary | ICD-10-CM | POA: Diagnosis not present

## 2024-06-10 LAB — CBC
HCT: 28.1 % — ABNORMAL LOW (ref 36.0–46.0)
Hemoglobin: 9.2 g/dL — ABNORMAL LOW (ref 12.0–15.0)
MCH: 31.1 pg (ref 26.0–34.0)
MCHC: 32.7 g/dL (ref 30.0–36.0)
MCV: 94.9 fL (ref 80.0–100.0)
Platelets: 177 K/uL (ref 150–400)
RBC: 2.96 MIL/uL — ABNORMAL LOW (ref 3.87–5.11)
RDW: 15.7 % — ABNORMAL HIGH (ref 11.5–15.5)
WBC: 4.5 K/uL (ref 4.0–10.5)
nRBC: 0 % (ref 0.0–0.2)

## 2024-06-10 LAB — GLUCOSE, CAPILLARY
Glucose-Capillary: 107 mg/dL — ABNORMAL HIGH (ref 70–99)
Glucose-Capillary: 152 mg/dL — ABNORMAL HIGH (ref 70–99)
Glucose-Capillary: 192 mg/dL — ABNORMAL HIGH (ref 70–99)
Glucose-Capillary: 180 mg/dL — ABNORMAL HIGH (ref 70–99)
Glucose-Capillary: 141 mg/dL — ABNORMAL HIGH (ref 70–99)
Glucose-Capillary: 158 mg/dL — ABNORMAL HIGH (ref 70–99)

## 2024-06-10 LAB — VITAMIN C: Vitamin C: 1.6 mg/dL (ref 0.4–2.0)

## 2024-06-10 NOTE — Progress Notes (Signed)
 NAME:  [name] [name], MRN:  [id number], DOB:  [birth year], LOS: 22 ADMISSION DATE:  05/19/2024 CONSULTATION DATE:  9/15/20025 REFERRING MD: Patsey - EDP, CHIEF COMPLAINT:  Code Stroke, ICH   History of Present Illness:  43 year old woman who presented to [facility] ED 9/15 as a Code Stroke. 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 Recent admissions 6/11-7/28 and 7/29-8/13 for ICH.  Patient presented to [facility] ED from dialysis as a Code Stroke; reportedly LKW 1450 with development of obtundation and hemiparesis. SBP 200s. NIHSS 31 with initial leftward gaze, decreased LOC/disorientation, bilateral facial droop/arm weakness/leg weakness, global aphasia, dysarthria, visual neglect. CT Head demonstrated large volume IVH with moderate hydrocephalus, acute hemorrhage of medial R temporal lobe and small foci of residual/recurrent hemorrhage in the L cerebellar hemisphere.   Patient was seen by stroke team and neurosurgery, EVD was placed on left side, PCCM was consulted for medical evaluation and admission to neuro ICU  Pertinent Medical History:   Past Medical History:  Diagnosis Date   Anemia    Anxiety    DM (diabetes mellitus), type 1 (HCC)    ESRD on hemodialysis (HCC)    Gastroparesis    History of simultaneous kidney and pancreas transplant (HCC) 2014   Hypertension    Ischemic cerebrovascular accident (CVA) (HCC) 11/2021   Narcotic abuse (HCC)    Non compliance with medical treatment    Stroke [facility])    Vision loss    Significant Hospital Events: Including procedures, antibiotic start and stop dates in addition to other pertinent events   9/15 admitted with ICH plus IVH, EVD was placed for hydrocephalus 9/16 remain on clevidipine  infusion, tolerating spontaneous breathing trial, EVD is draining bloody fluid 9/17 continue to  require clevidipine  infusion, tolerated spontaneous breathing trial 9/18: No overnight issues. Currently off sedation. Still on high-dose `clevidipine  infusion 9/19: this am, dialysis ongoing. Neurosx plans to continue ventriculostomy for now. Remains on cleviprex  to maintain BP goal. Tmax 100.1. despite plt decrease 4T score 0. Will maintain heparin  as is for now and closely follow trend.  9/27 Spiking low grade fevers, wheezing on exam, CXR normal  9/30 No tolerating when EVD is raised. Still bloody fluid. CT head will be done. 10/1 -developed fevers pancultures drawn and broad-spectrum antibiotics started 10/2 became afebrile after starting antibiotics, did not tolerate raising EVD 10/3 serum potassium 6.5, receiving hemodialysis, no overnight issues.  Did not tolerate weaning EVD 10/4 EVD remain at 10, no issues 10/5 EVD remains at 10, afebrile 10/6 EVD was raised to 30 cm water , tolerating well, remained afebrile  Interim History / Subjective:  Patient remained clinically stable after raising EVD to 30 Afebrile Remained aphasic  Objective:  Blood pressure 136/69, pulse 97, temperature 98.6 F (37 C), temperature source Axillary, resp. rate (!) 27, height 5' 3 (1.6 m), weight 53.7 kg, SpO2 100%.        Intake/Output Summary (Last 24 hours) at 06/10/2024 0834 Last data filed at 06/10/2024 0800 Gross per 24 hour  Intake 1320 ml  Output 2269 ml  Net -949 ml   Filed Weights   06/07/24 0348 06/08/24 0358 06/09/24 0351  Weight: 50.4 kg 52 kg 53.7 kg   Physical Examination: General: Chronically ill-appearing female, lying on the bed HEENT: [place]/AT, eyes anicteric.  moist mucus membranes Neuro: Eyes open, intermittently tracking examiner, not following commands, aphasic, moving all 4 extremities  purposefully Chest: Coarse breath sounds, no wheezes or rhonchi Heart: Regular rate and rhythm, no murmurs or gallops Abdomen: Soft, nontender, nondistended, bowel sounds present.  PEG tube  in place   Labs and images reviewed  Patient Lines/Drains/Airways Status     Active Line/Drains/Airways     Name Placement date Placement time Site Days   Peripheral IV 06/08/24 22 G 1.75 Anterior;Left Forearm 06/08/24  0612  Forearm  1   Fistula / Graft Right Upper arm Arteriovenous fistula 12/08/20  1403  Upper arm  1279   Gastrostomy/Enterostomy PEG-jejunostomy 18 Fr. LUQ 05/21/24  0827  LUQ  19   ICP/Ventriculostomy Ventricular drainage catheter Left Parietal region 05/19/24  1730  Parietal region  21   Fecal Management System 40 mL 06/02/24  1700  -- 7   Wound 05/19/24 1933 Pressure Injury Buttocks Bilateral Stage 1 -  Intact skin with non-blanchable redness of a localized area usually over a bony prominence. 05/19/24  1933  Buttocks  21         Resolved Hospital problems  Hypertensive emergency, resolved Acute respiratory insufficiency  thrombocytopenia of critical illness Serratia/klebsiella pneumonia Hyperkalemia Acute encephalopathy in the setting of ICH and hydrocephalus  Assessment & Plan:  Acute right parietotemporal intraparenchymal hemorrhage with IVH with ICH score 2 due to uncontrolled hypertension Obstructive hydrocephalus status post EVD, currently at 30 cm water  End-stage renal disease on hemodialysis with failed renal transplant but functional pancreatic transplant on immunosuppressive therapy Hypervolemic hyponatremia, hypochloremia  Type 1 diabetes, complicated with vasculopathy and gastroparesis- not on long term insulin  anymore post pancreatic transplant Narcotic abuse Moderate protein calorie malnutrition Anemia of chronic disease Stage 1 pressure ulcer POA Immunosuppression for pancreas kidney transplant Seizure disorder Hypercalcemia   Patient is neurologically stable Remain aphasic EVD was raised to 30 cm water , tolerating well, patient will have CT head tomorrow, if looks stable, EVD will be discontinued per neurosurgery  recommendations Remain afebrile Continue immunosuppressive therapy with cyclosporine , mycophenolate  and prednisone  Closely monitor electrolytes Blood sugars are controlled, continue sliding scale insulin  Continue dietary supplements with tube feeds Monitor H&H Continue wound care Continue oral antihypertensive meds Continue Vimpat      Valinda Novas, MD [facility] See Amion for pager If no response to pager, please call [phone] until 7pm After 7pm, Please call E-link [phone]

## 2024-06-10 NOTE — TOC Progression Note (Signed)
 Transition of Care [facility]) - Progression Note    Patient Details  Name: [name] [name] MRN: [id number] Date of Birth: 1981-08-11  [facility] CM/SW Contact  Inocente GORMAN Kindle, LCSW Phone Number: 06/10/2024, 5:51 PM  Clinical Narrative:    CSW continuing to follow.    Expected Discharge Plan: Skilled Nursing Facility Barriers to Discharge: Continued Medical Work up, English as a second language teacher               Expected Discharge Plan and Services In-house Referral: Clinical Social Work   Post Acute Care Choice: Skilled Nursing Facility Living arrangements for the past 2 months: Apartment                                       Social Drivers of Health (SDOH) Interventions SDOH Screenings   Food Insecurity: Patient Unable To Answer (05/20/2024)  Housing: Patient Unable To Answer (05/20/2024)  Transportation Needs: Patient Unable To Answer (05/20/2024)  Utilities: Patient Unable To Answer (05/20/2024)  Social Connections: Unknown (10/04/2022)   Received from [facility]  Stress: No Stress Concern Present (04/16/2024)   Received from [facility]  Tobacco Use: Medium Risk (04/27/2024)    Readmission Risk Interventions    05/20/2024    4:16 PM 03/31/2024   11:27 AM 11/17/2021   12:15 PM  Readmission Risk Prevention Plan  Transportation Screening Complete Complete Complete  Medication Review Oceanographer) Complete Complete Complete  PCP or Specialist appointment within 3-5 days of discharge Complete  Complete  HRI or Home Care Consult Complete Complete Complete  SW Recovery Care/Counseling Consult Complete Complete Complete  Palliative Care Screening Complete Not Applicable Not Applicable  Skilled Nursing Facility Complete Not Applicable Patient Refused

## 2024-06-10 NOTE — Plan of Care (Signed)
     Brief palliative note:   Chart reviewed. Initial consult completed 05/20/24.  Goals are clear for ongoing full scope care, and this is highly unlikely to change due to patient and family's religious beliefs.  Palliative care is following for needs and support. Please contact our team at [phone] if there are urgent needs.    Recardo Loll, NP-C Palliative Medicine   Please call Palliative Medicine team phone with any questions [phone]. For individual providers please see AMION.   No charge

## 2024-06-10 NOTE — Progress Notes (Signed)
 Patient ID: [name] [name], female   DOB: Jul 20, 1981, 43 y.o.   MRN: [id number]  [facility] Progress Note   Subjective:    Seen in room.  Sleeping, does not arouse   Objective:   BP (!) 128/54   Pulse (!) 116   Temp 98.6 F (37 C)   Resp (!) 28   Ht 5' 3 (1.6 m)   Wt 45 kg   SpO2 99%   BMI 17.57 kg/m   Physical Exam: Gen: Comfortably resting in bed, unresponsive. +Ventriculostomy drain, still with a little blood in it. CVS: Pulse regular tachycardia, S1 and S2 normal Resp: Anteriorly clear to auscultation, no rales/rhonchi Abd: Soft, flat, nontender, bowel sounds normal Ext: No lower extremity edema RUA AVF+bruit    Assessment/ Plan:    OP HD: NW MWF 3h  B400  55.2kg  2K bath   AVF  Heparin  none Mircera 150 mcg q 2 wks, due 9/24  Acute right intracranial hemorrhage: Associated with uncontrolled hypertension and status post ventriculostomy.  Extubated 9/22. Ventriculostomy fluid remains bloody. May need VP shunt. Per NSG.  ESRD/ hx of renal allograft failure: Continue HD on MWF. Next HD Wed. 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 Getting darbe 100 mcg sq weekly.  CKD-MBD: Elevated calcium  and phosphorus levels noted. PTH level was low at 29. Suspect ^Ca++ due to immobility.  Nutrition: Ongoing tube feeds, monitor electrolytes/volume status. Hypertension: BP's were very high, then dropped low. Is now getting norvasc , clonidine  and coreg  on non-hd days only.  Volume: under dry wt 5-7kg. Tolerating usually 1- 2 L UF.  Dispo: spoke with mom- full scope care, full code.  She is hopeful for pt to get another transplant.   [name] Bonine MD 06/10/2024, 1:40 PM  Recent Labs   Lab 06/07/24 0902 06/08/24 0425 06/09/24 0640 06/10/24 0322  HGB 8.6* 8.6* 7.8* 9.2*  ALBUMIN  3.2* 2.9*  --   --   CALCIUM  11.6* 11.7*  --   --   CREATININE 3.23* 4.42*  --   --   K 4.6 4.6  --   --     Inpatient medications:  amLODipine   10 mg Per Tube Once per day on Sunday Tuesday Thursday Saturday   arformoterol  15 mcg Nebulization BID   carvedilol   25 mg Per Tube 2 times per day on Sunday Tuesday Thursday Saturday   Chlorhexidine  Gluconate Cloth  6 each Topical Q0600   cloNIDine   0.2 mg Per Tube 3 times per day on Sunday Tuesday Thursday Saturday   cycloSPORINE   200 mg Per Tube QPM   cycloSPORINE   225 mg Per Tube Daily   darbepoetin (ARANESP ) injection - DIALYSIS  100 mcg Subcutaneous Q Wed-1800   fiber  1 packet Per Tube TID   heparin  injection (subcutaneous)  5,000 Units Subcutaneous Q12H   hydrALAZINE   100 mg Per Tube 3 times per day on Sunday Tuesday Thursday Saturday   insulin  aspart  0-9 Units Subcutaneous Q4H   lacosamide   100 mg Per Tube BID   multivitamin  15 mL Per Tube Daily   mycophenolate   500 mg Per Tube BID   mouth rinse  15 mL Mouth Rinse 4 times per day   pantoprazole  (PROTONIX ) IV  40 mg Intravenous  QHS   predniSONE   15 mg Per Tube Q breakfast    anticoagulant sodium citrate      feeding supplement (NEPRO CARB STEADY) 55 mL/hr at 06/10/24 0600   acetaminophen  **OR** acetaminophen  (TYLENOL ) oral liquid 160 mg/5 mL **OR** acetaminophen , alteplase , anticoagulant sodium citrate , heparin , labetalol , lidocaine  (PF), lidocaine -prilocaine , midodrine , mouth rinse, pentafluoroprop-tetrafluoroeth, sennosides

## 2024-06-10 NOTE — Progress Notes (Signed)
 Subjective: The patient is alert and in no apparent distress.  Her mother is at the bedside.  Objective: Vital signs in last 24 hours: Temp:  [96.7 F (35.9 C)-99.7 F (37.6 C)] 98.6 F (37 C) (10/07 0400) Pulse Rate:  [86-98] 97 (10/07 0600) Resp:  [17-33] 24 (10/07 0600) BP: (129-166)/(54-103) 147/74 (10/07 0600) SpO2:  [99 %-100 %] 100 % (10/07 0600) Estimated body mass index is 20.97 kg/m as calculated from the following:   Height as of this encounter: 5' 3 (1.6 m).   Weight as of this encounter: 53.7 kg.   Intake/Output from previous day: 10/06 0701 - 10/07 0700 In: 1375 [NG/GT:1265] Out: 2274 [Drains:74] Intake/Output this shift: No intake/output data recorded.  Physical exam the patient is alert.  Her ventriculostomy is patent and not draining much at 30 cm.  Lab Results: Recent Labs    06/09/24 0640 06/10/24 0322  WBC 4.2 4.5  HGB 7.8* 9.2*  HCT 24.1* 28.1*  PLT 146* 177   BMET Recent Labs    06/07/24 0902 06/08/24 0425  NA 133* 132*  K 4.6 4.6  CL 91* 90*  CO2 23 23  GLUCOSE 131* 140*  BUN 63* 94*  CREATININE 3.23* 4.42*  CALCIUM  11.6* 11.7*    Studies/Results: No results found.  Assessment/Plan: Cerebral hemorrhage, intraventricular hemorrhage, hydrocephalus, ventriculostomy: So far the patient is tolerating raising her ventriculostomy to 30.  We will continue it at that height and plan to repeat her CAT scan tomorrow.  Hopefully we can remove the ventriculostomy and avoid a ventriculoperitoneal shunt.  LOS: 22 days     [name] [name] 06/10/2024, 7:42 AM     Patient ID: [name] [name], female   DOB: 1980-09-22, 43 y.o.   MRN: [id number]

## 2024-06-11 ENCOUNTER — Inpatient Hospital Stay (HOSPITAL_COMMUNITY)

## 2024-06-11 ENCOUNTER — Other Ambulatory Visit: Payer: Self-pay | Admitting: Neurosurgery

## 2024-06-11 DIAGNOSIS — G911 Obstructive hydrocephalus: Secondary | ICD-10-CM | POA: Diagnosis not present

## 2024-06-11 DIAGNOSIS — I610 Nontraumatic intracerebral hemorrhage in hemisphere, subcortical: Secondary | ICD-10-CM | POA: Diagnosis not present

## 2024-06-11 DIAGNOSIS — E440 Moderate protein-calorie malnutrition: Secondary | ICD-10-CM | POA: Diagnosis not present

## 2024-06-11 DIAGNOSIS — N186 End stage renal disease: Secondary | ICD-10-CM | POA: Diagnosis not present

## 2024-06-11 LAB — CSF CELL COUNT WITH DIFFERENTIAL
RBC Count, CSF: 8500 /mm3 — ABNORMAL HIGH
RBC Count, CSF: 8750 /mm3 — ABNORMAL HIGH
Tube #: 1
Tube #: 4
WBC, CSF: 4 /mm3 (ref 0–5)
WBC, CSF: 7 /mm3 — ABNORMAL HIGH (ref 0–5)

## 2024-06-11 LAB — ZINC: Zinc: 74 ug/dL (ref 44–115)

## 2024-06-11 LAB — CBC
HCT: 26 % — ABNORMAL LOW (ref 36.0–46.0)
Hemoglobin: 8.4 g/dL — ABNORMAL LOW (ref 12.0–15.0)
MCH: 31.3 pg (ref 26.0–34.0)
MCHC: 32.3 g/dL (ref 30.0–36.0)
MCV: 97 fL (ref 80.0–100.0)
Platelets: 158 K/uL (ref 150–400)
RBC: 2.68 MIL/uL — ABNORMAL LOW (ref 3.87–5.11)
RDW: 15.8 % — ABNORMAL HIGH (ref 11.5–15.5)
WBC: 3.4 K/uL — ABNORMAL LOW (ref 4.0–10.5)
nRBC: 0 % (ref 0.0–0.2)

## 2024-06-11 LAB — RENAL FUNCTION PANEL
Albumin: 2.9 g/dL — ABNORMAL LOW (ref 3.5–5.0)
Anion gap: 19 — ABNORMAL HIGH (ref 5–15)
BUN: 94 mg/dL — ABNORMAL HIGH (ref 6–20)
CO2: 25 mmol/L (ref 22–32)
Calcium: 11.3 mg/dL — ABNORMAL HIGH (ref 8.9–10.3)
Chloride: 85 mmol/L — ABNORMAL LOW (ref 98–111)
Creatinine, Ser: 4.67 mg/dL — ABNORMAL HIGH (ref 0.44–1.00)
GFR, Estimated: 11 mL/min — ABNORMAL LOW (ref >60)
Glucose, Bld: 146 mg/dL — ABNORMAL HIGH (ref 70–99)
Phosphorus: 7.2 mg/dL — ABNORMAL HIGH (ref 2.5–4.6)
Potassium: 4.5 mmol/L (ref 3.5–5.1)
Sodium: 129 mmol/L — ABNORMAL LOW (ref 135–145)

## 2024-06-11 LAB — GLUCOSE, CAPILLARY
Glucose-Capillary: 119 mg/dL — ABNORMAL HIGH (ref 70–99)
Glucose-Capillary: 165 mg/dL — ABNORMAL HIGH (ref 70–99)
Glucose-Capillary: 166 mg/dL — ABNORMAL HIGH (ref 70–99)
Glucose-Capillary: 172 mg/dL — ABNORMAL HIGH (ref 70–99)
Glucose-Capillary: 128 mg/dL — ABNORMAL HIGH (ref 70–99)
Glucose-Capillary: 95 mg/dL (ref 70–99)

## 2024-06-11 LAB — PROTEIN AND GLUCOSE, CSF
Glucose, CSF: 81 mg/dL — ABNORMAL HIGH (ref 40–70)
Total  Protein, CSF: 65 mg/dL — ABNORMAL HIGH (ref 15–45)

## 2024-06-11 LAB — COPPER, SERUM: Copper: 66 ug/dL — ABNORMAL LOW (ref 80–158)

## 2024-06-11 MED ORDER — ALBUMIN HUMAN 25 % IV SOLN
INTRAVENOUS | Status: AC
  Administered 2024-06-11: 25 g via INTRAVENOUS
  Filled 2024-06-11: qty 100

## 2024-06-11 MED ORDER — ONDANSETRON HCL 4 MG/2ML IJ SOLN
4.0000 mg | Freq: Once | INTRAMUSCULAR | Status: AC
  Administered 2024-06-11: 4 mg via INTRAVENOUS
  Filled 2024-06-11: qty 2

## 2024-06-11 MED ORDER — ALBUMIN HUMAN 25 % IV SOLN: 25.0000 g | Freq: Once | INTRAVENOUS | Status: AC

## 2024-06-11 NOTE — Progress Notes (Signed)
 Subjective: The patient is somnolent but arousable with no significant change.  Objective: Vital signs in last 24 hours: Temp:  [97.2 F (36.2 C)-99.2 F (37.3 C)] 98.6 F (37 C) (10/08 0815) Pulse Rate:  [90-102] 91 (10/08 0848) Resp:  [12-31] 30 (10/08 0848) BP: (116-155)/(56-75) 146/65 (10/08 0848) SpO2:  [98 %-100 %] 100 % (10/08 0848) Weight:  [50.8 kg-51 kg] 51 kg (10/08 0815) Estimated body mass index is 19.92 kg/m as calculated from the following:   Height as of this encounter: 5' 3 (1.6 m).   Weight as of this encounter: 51 kg.   Intake/Output from previous day: 10/07 0701 - 10/08 0700 In: 1320 [NG/GT:1320] Out: 297 [Drains:47; Stool:250] Intake/Output this shift: Total I/O In: 110 [NG/GT:110] Out: 0   Physical exam the patient somnolent but arousable.  Really no change neurologically.  Her pupils are equal.  Her ventriculostomy is patent.  Reviewed the patient's head CT performed today.  She still has a moderate to right intraventricular clot and has moderate ventriculomegaly which looks a bit worse than with prior scanning with a ventriculostomy at the lower level.  Lab Results: Recent Labs    06/10/24 0322 06/11/24 0840  WBC 4.5 3.4*  HGB 9.2* 8.4*  HCT 28.1* 26.0*  PLT 177 158   BMET No results for input(s): NA, K, CL, CO2, GLUCOSE, BUN, CREATININE, CALCIUM  in the last 72 hours.  Studies/Results: CT HEAD WO CONTRAST ( ) Result Date: [name] EXAM: CT HEAD WITHOUT CONTRAST 06/11/2024 02:45:13 [name] TECHNIQUE: CT of the head was performed without the administration of intravenous contrast. Automated exposure control, iterative reconstruction, and/or weight based adjustment of the mA/kV was utilized to reduce the radiation dose to as low as reasonably achievable. COMPARISON: None available. CLINICAL HISTORY: Hydrocephalus. FINDINGS: BRAIN AND VENTRICLES: Slightly decreased volume of intraventricular hemorrhage which is also decreased in  attenuation. Mildly increased (now moderate) hydrocephalus. Similar position of the left frontal approach ventricular catheter. Consistent periventricular hypoattenuation, suggestive of transependymal flow of CSF. Similar remote left cerebellar and left posterior MCA territory infarction. No extra-axial collection. No mass effect or midline shift. ORBITS: No acute abnormality. SINUSES: No acute abnormality. SOFT TISSUES AND SKULL: No acute soft tissue abnormality. No skull fracture. Other: Bilateral mastoid effusions. These results will be called to the ordering clinician or representative by the Radiologist Assistant, and communication documented in the PACS or [employer]. IMPRESSION: 1. Mildly increased (now moderate) hydrocephalus with periventricular hypoattenuation suggesting transependymal flow of CSF. Left frontal ventricular catheter in similar position. 2. Slightly decreased intraventricular hemorrhage. 3. Remote left cerebellar and left posterior MCA territory infarctions, unchanged. Electronically signed by: Gilmore Molt MD 06/11/2024 03:08 [name] EDT RP Workstation: [id number]    Assessment/Plan: Ventricular hemorrhage, hydrocephalus: It looks like the patient will need a ventriculoperitoneal shunt.  I sent off CSF for studies.  She has had a laparotomy and PEG tube placement.  I will coordinate placement of a ventriculoperitoneal shunt with general surgery, hopefully next week depending on the CSF studies.  LOS: 23 days     [name] [name], [name] [name]     Patient ID: [name] [name], female   DOB: Jul 11, 1981, 43 y.o.   MRN: [id number]

## 2024-06-11 NOTE — Progress Notes (Signed)
 Patient ID: [name] [name], female   DOB: Apr 13, 1981, 43 y.o.   MRN: [id number] [facility] Progress Note   Subjective:    Seen in room after dialysis, tolerated well.  UF 1.5L.  Will need a VP shunt per NSG   Objective:   BP (!) 128/54   Pulse (!) 116   Temp 98.6 F (37 C)   Resp (!) 28   Ht 5' 3 (1.6 m)   Wt 45 kg   SpO2 99%   BMI 17.57 kg/m   Physical Exam: Gen: Comfortably resting in bed, unresponsive. +Ventriculostomy drain, still with a little blood in it. CVS: Pulse regular tachycardia, S1 and S2 normal Resp: Anteriorly clear to auscultation, no rales/rhonchi Abd: Soft, flat, nontender, bowel sounds normal Ext: No lower extremity edema RUA AVF+bruit    Assessment/ Plan:    OP HD: NW MWF 3h  B400  55.2kg  2K bath   AVF  Heparin  none Mircera 150 mcg q 2 wks, due 9/24  Acute right intracranial hemorrhage: Associated with uncontrolled hypertension and status post ventriculostomy.  Extubated 9/22. Ventriculostomy fluid remains bloody. May need VP shunt. Per NSG.  ESRD/ hx of renal allograft failure: Continue HD on MWF. Next HD 06/13/24. 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 Getting darbe 100 mcg sq weekly.  CKD-MBD: Elevated calcium  and phosphorus levels noted. PTH level was low at 29. Suspect ^Ca++ due to immobility.  Nutrition: Ongoing tube feeds, monitor electrolytes/volume status. Hypertension: BP's were very high, then dropped low. Is now getting norvasc , clonidine  and coreg  on non-hd days only.  Volume: under dry wt 5-7kg. Tolerating usually 1- 2 L UF.  Dispo: spoke with mom- full scope care, full code.  She is hopeful for pt to get another  transplant.   [name] Bonine MD 06/11/2024, 2:18 PM  Recent Labs  Lab 06/08/24 0425 06/09/24 0640 06/10/24 0322 06/11/24 0840  HGB 8.6*   < > 9.2* 8.4*  ALBUMIN  2.9*  --   --  2.9*  CALCIUM  11.7*  --   --  11.3*  PHOS  --   --   --  7.2*  CREATININE 4.42*  --   --  4.67*  K 4.6  --   --  4.5   < > = values in this interval not displayed.    Inpatient medications:  amLODipine   10 mg Per Tube Once per day on Sunday Tuesday Thursday Saturday   carvedilol   25 mg Per Tube 2 times per day on Sunday Tuesday Thursday Saturday   Chlorhexidine  Gluconate Cloth  6 each Topical Q0600   cloNIDine   0.2 mg Per Tube 3 times per day on Sunday Tuesday Thursday Saturday   cycloSPORINE   200 mg Per Tube QPM   cycloSPORINE   225 mg Per Tube Daily   darbepoetin (ARANESP ) injection - DIALYSIS  100 mcg Subcutaneous Q Wed-1800   fiber  1 packet Per Tube TID   heparin  injection (subcutaneous)  5,000 Units Subcutaneous Q12H   hydrALAZINE   100 mg Per Tube 3 times per day on Sunday Tuesday Thursday Saturday   insulin  aspart  0-9 Units Subcutaneous Q4H   lacosamide   100 mg Per Tube BID   multivitamin  15 mL Per Tube Daily   mycophenolate   500 mg Per Tube BID   mouth rinse  15 mL Mouth Rinse 4 times per day   pantoprazole  (PROTONIX ) IV  40 mg Intravenous QHS   predniSONE   15 mg Per Tube Q breakfast    anticoagulant sodium citrate      feeding supplement (NEPRO CARB STEADY) 55 mL/hr at 06/11/24 1400   acetaminophen  **OR** acetaminophen  (TYLENOL ) oral liquid 160 mg/5 mL **OR** acetaminophen , alteplase , anticoagulant sodium citrate , heparin , labetalol , lidocaine  (PF), lidocaine -prilocaine , midodrine , mouth rinse, pentafluoroprop-tetrafluoroeth, sennosides

## 2024-06-11 NOTE — Progress Notes (Signed)
 CT scan results received. Gerard Beck, NP with [facility] notified of results at [phone]. No changes to be made at this time.

## 2024-06-11 NOTE — Progress Notes (Signed)
 NAME:  [name] [name], MRN:  [id number], DOB:  May 15, 1981, LOS: 23 ADMISSION DATE:  05/19/2024 CONSULTATION DATE:  9/15/20025 REFERRING MD: Patsey - EDP, CHIEF COMPLAINT:  Code Stroke, ICH   History of Present Illness:  43 year old woman who presented to [facility] ED 9/15 as a Code Stroke. 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 Recent admissions 6/11-7/28 and 7/29-8/13 for ICH.  Patient presented to [facility] ED from dialysis as a Code Stroke; reportedly LKW 1450 with development of obtundation and hemiparesis. SBP 200s. NIHSS 31 with initial leftward gaze, decreased LOC/disorientation, bilateral facial droop/arm weakness/leg weakness, global aphasia, dysarthria, visual neglect. CT Head demonstrated large volume IVH with moderate hydrocephalus, acute hemorrhage of medial R temporal lobe and small foci of residual/recurrent hemorrhage in the L cerebellar hemisphere.   Patient was seen by stroke team and neurosurgery, EVD was placed on left side, PCCM was consulted for medical evaluation and admission to neuro ICU  Pertinent Medical History:   Past Medical History:  Diagnosis Date   Anemia    Anxiety    DM (diabetes mellitus), type 1 (HCC)    ESRD on hemodialysis (HCC)    Gastroparesis    History of simultaneous kidney and pancreas transplant (HCC) 2014   Hypertension    Ischemic cerebrovascular accident (CVA) (HCC) 11/2021   Narcotic abuse (HCC)    Non compliance with medical treatment    Stroke [facility])    Vision loss    Significant Hospital Events: Including procedures, antibiotic start and stop dates in addition to other pertinent events   9/15 admitted with ICH plus IVH, EVD was placed for hydrocephalus 9/16 remain on clevidipine  infusion, tolerating spontaneous breathing trial, EVD is draining bloody fluid 9/17 continue to  require clevidipine  infusion, tolerated spontaneous breathing trial 9/18: No overnight issues. Currently off sedation. Still on high-dose `clevidipine  infusion 9/19: this am, dialysis ongoing. Neurosx plans to continue ventriculostomy for now. Remains on cleviprex  to maintain BP goal. Tmax 100.1. despite plt decrease 4T score 0. Will maintain heparin  as is for now and closely follow trend.  9/27 Spiking low grade fevers, wheezing on exam, CXR normal  9/30 No tolerating when EVD is raised. Still bloody fluid. CT head will be done. 10/1 -developed fevers pancultures drawn and broad-spectrum antibiotics started 10/2 became afebrile after starting antibiotics, did not tolerate raising EVD 10/3 serum potassium 6.5, receiving hemodialysis, no overnight issues.  Did not tolerate weaning EVD 10/4 EVD remain at 10, no issues 10/5 EVD remains at 10, afebrile 10/6 EVD was raised to 30 cm water , tolerating well, remained afebrile 10/7 EVD is at 30 cm water , CT scan will be done tomorrow morning, no overnight issues  Interim History / Subjective:  Patient underwent CT head showing slightly increased hydrocephalus from mild now to moderate But no change in neuroexam Remain afebrile EVD remain at 30 cm water   Objective:  Blood pressure 131/66, pulse 90, temperature 99.2 F (37.3 C), temperature source Axillary, resp. rate 12, height 5' 3 (1.6 m), weight 50.8 kg, SpO2 100%.        Intake/Output Summary (Last 24 hours) at 06/11/2024 0805 Last data filed at 06/11/2024 0700 Gross per 24 hour  Intake 1320 ml  Output 297 ml  Net 1023 ml   Filed Weights   06/08/24 0358 06/09/24 0351 06/11/24 0704  Weight: 52 kg 53.7 kg 50.8 kg   Physical Examination:  General: Chronically ill-appearing female, lying on the bed HEENT: [place]/AT, eyes anicteric.  moist mucus membranes Neuro: Opens eyes with vocal stimuli, tracking examiner intermittently, not following commands, antigravity in the right upper extremity,  withdrawing in bilateral lower extremities, weak on left upper extremity Chest: Coarse breath sounds, no wheezes or rhonchi Heart: Regular rate and rhythm, no murmurs or gallops Abdomen: Soft, nontender, nondistended, bowel sounds present   Imaging reviewed, labs pending  Patient Lines/Drains/Airways Status     Active Line/Drains/Airways     Name Placement date Placement time Site Days   Peripheral IV 06/08/24 22 G 1.75 Anterior;Left Forearm 06/08/24  0612  Forearm  1   Fistula / Graft Right Upper arm Arteriovenous fistula 12/08/20  1403  Upper arm  1279   Gastrostomy/Enterostomy PEG-jejunostomy 18 Fr. LUQ 05/21/24  0827  LUQ  19   ICP/Ventriculostomy Ventricular drainage catheter Left Parietal region 05/19/24  1730  Parietal region  21   Fecal Management System 40 mL 06/02/24  1700  -- 7   Wound 05/19/24 1933 Pressure Injury Buttocks Bilateral Stage 1 -  Intact skin with non-blanchable redness of a localized area usually over a bony prominence. 05/19/24  1933  Buttocks  21         Resolved Hospital problems  Hypertensive emergency IV Acute respiratory insufficiency  thrombocytopenia of critical illness Serratia/klebsiella pneumonia Hyperkalemia Acute encephalopathy in the setting of ICH and hydrocephalus Narcotic abuse  Assessment & Plan:  Acute right parietotemporal intraparenchymal hemorrhage with IVH with ICH score 2 due to uncontrolled hypertension Obstructive hydrocephalus status post EVD, currently at 30 cm water , with repeat head CT showing slightly increased hydrocephalus End-stage renal disease on hemodialysis with failed renal transplant but functional pancreatic transplant on immunosuppressive therapy Hypervolemic hyponatremia, hypochloremia  Type 1 diabetes, complicated with vasculopathy and gastroparesis- not on long term insulin  anymore post pancreatic transplant Moderate protein calorie malnutrition Anemia of chronic disease Stage 1 pressure ulcer  POA Immunosuppression for pancreas kidney transplant Seizure disorder Hypercalcemia   Continue neuro watch Head CT overnight showing slightly increased hydrocephalus from mild to moderate now but patient neurological exam remains same Remain aphasic Still EVD is at 30 cm water  Awaiting neurosurgery input Continue immunosuppressive therapy with cyclosporine , mycophenolate  and prednisone  Closely monitor electrolytes Blood sugars are controlled, continue sliding scale insulin  Continue dietary supplements with tube feeds Monitor H&H Continue wound care Continue oral antihypertensive meds Continue Vimpat  Closely monitor serum calcium  Repeat labs are pending     Valinda Novas, MD [facility] See Amion for pager If no response to pager, please call [phone] until 7pm After 7pm, Please call E-link [phone]

## 2024-06-11 NOTE — Progress Notes (Signed)
 Patient was seen by neurosurgery, due to increasing hydrocephalus and output moderate degree, EVD was lowered to 10 cm water , plan for VP shunt per neurosurgery scheduled   Valinda Novas, MD

## 2024-06-11 NOTE — Progress Notes (Signed)
 Pt vomited at about 1905.  Tube feeds stopped.  CCM notified.  One time dose of Zofran  ordered.

## 2024-06-12 DIAGNOSIS — G911 Obstructive hydrocephalus: Secondary | ICD-10-CM | POA: Diagnosis not present

## 2024-06-12 DIAGNOSIS — I610 Nontraumatic intracerebral hemorrhage in hemisphere, subcortical: Secondary | ICD-10-CM | POA: Diagnosis not present

## 2024-06-12 DIAGNOSIS — E440 Moderate protein-calorie malnutrition: Secondary | ICD-10-CM | POA: Diagnosis not present

## 2024-06-12 DIAGNOSIS — N186 End stage renal disease: Secondary | ICD-10-CM | POA: Diagnosis not present

## 2024-06-12 LAB — CBC WITH DIFFERENTIAL/PLATELET
Abs Immature Granulocytes: 0.03 K/uL (ref 0.00–0.07)
Basophils Absolute: 0 K/uL (ref 0.0–0.1)
Basophils Relative: 0 %
Eosinophils Absolute: 0.1 K/uL (ref 0.0–0.5)
Eosinophils Relative: 2 %
HCT: 25.2 % — ABNORMAL LOW (ref 36.0–46.0)
Hemoglobin: 8.3 g/dL — ABNORMAL LOW (ref 12.0–15.0)
Immature Granulocytes: 1 %
Lymphocytes Relative: 24 %
Lymphs Abs: 0.9 K/uL (ref 0.7–4.0)
MCH: 31.7 pg (ref 26.0–34.0)
MCHC: 32.9 g/dL (ref 30.0–36.0)
MCV: 96.2 fL (ref 80.0–100.0)
Monocytes Absolute: 0.4 K/uL (ref 0.1–1.0)
Monocytes Relative: 11 %
Neutro Abs: 2.5 K/uL (ref 1.7–7.7)
Neutrophils Relative %: 62 %
Platelets: 146 K/uL — ABNORMAL LOW (ref 150–400)
RBC: 2.62 MIL/uL — ABNORMAL LOW (ref 3.87–5.11)
RDW: 15.1 % (ref 11.5–15.5)
WBC: 3.9 K/uL — ABNORMAL LOW (ref 4.0–10.5)
nRBC: 0 % (ref 0.0–0.2)

## 2024-06-12 LAB — GLUCOSE, CAPILLARY
Glucose-Capillary: 114 mg/dL — ABNORMAL HIGH (ref 70–99)
Glucose-Capillary: 102 mg/dL — ABNORMAL HIGH (ref 70–99)
Glucose-Capillary: 155 mg/dL — ABNORMAL HIGH (ref 70–99)
Glucose-Capillary: 183 mg/dL — ABNORMAL HIGH (ref 70–99)
Glucose-Capillary: 149 mg/dL — ABNORMAL HIGH (ref 70–99)
Glucose-Capillary: 130 mg/dL — ABNORMAL HIGH (ref 70–99)

## 2024-06-12 LAB — BASIC METABOLIC PANEL WITH GFR
Anion gap: 17 — ABNORMAL HIGH (ref 5–15)
BUN: 45 mg/dL — ABNORMAL HIGH (ref 6–20)
CO2: 27 mmol/L (ref 22–32)
Calcium: 11.4 mg/dL — ABNORMAL HIGH (ref 8.9–10.3)
Chloride: 88 mmol/L — ABNORMAL LOW (ref 98–111)
Creatinine, Ser: 2.92 mg/dL — ABNORMAL HIGH (ref 0.44–1.00)
GFR, Estimated: 20 mL/min — ABNORMAL LOW (ref >60)
Glucose, Bld: 104 mg/dL — ABNORMAL HIGH (ref 70–99)
Potassium: 3.6 mmol/L (ref 3.5–5.1)
Sodium: 132 mmol/L — ABNORMAL LOW (ref 135–145)

## 2024-06-12 MED ORDER — METOCLOPRAMIDE HCL 5 MG/ML IJ SOLN
10.0000 mg | Freq: Once | INTRAMUSCULAR | Status: AC
  Administered 2024-06-12: 10 mg via INTRAVENOUS
  Filled 2024-06-12: qty 2

## 2024-06-12 MED ORDER — HYDRALAZINE HCL 20 MG/ML IJ SOLN
20.0000 mg | Freq: Once | INTRAMUSCULAR | Status: AC
  Administered 2024-06-12: 20 mg via INTRAVENOUS
  Filled 2024-06-12: qty 1

## 2024-06-12 MED ORDER — CLONIDINE HCL 0.2 MG PO TABS
0.3000 mg | ORAL_TABLET | ORAL | Status: DC
  Administered 2024-06-12 – 2024-06-17 (×9): 0.3 mg
  Filled 2024-06-12 (×11): qty 1

## 2024-06-12 NOTE — Progress Notes (Addendum)
 NAME:  [name] [name], MRN:  [id number], DOB:  08-25-1981, LOS: 24 ADMISSION DATE:  05/19/2024 CONSULTATION DATE:  9/15/20025 REFERRING MD: Patsey - EDP, CHIEF COMPLAINT:  Code Stroke, ICH   History of Present Illness:  43 year old woman who presented to [facility] ED 9/15 as a Code Stroke. 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 Recent admissions 6/11-7/28 and 7/29-8/13 for ICH.  Patient presented to [facility] ED from dialysis as a Code Stroke; reportedly LKW 1450 with development of obtundation and hemiparesis. SBP 200s. NIHSS 31 with initial leftward gaze, decreased LOC/disorientation, bilateral facial droop/arm weakness/leg weakness, global aphasia, dysarthria, visual neglect. CT Head demonstrated large volume IVH with moderate hydrocephalus, acute hemorrhage of medial R temporal lobe and small foci of residual/recurrent hemorrhage in the L cerebellar hemisphere.   Patient was seen by stroke team and neurosurgery, EVD was placed on left side, PCCM was consulted for medical evaluation and admission to neuro ICU  Pertinent Medical History:   Past Medical History:  Diagnosis Date   Anemia    Anxiety    DM (diabetes mellitus), type 1 (HCC)    ESRD on hemodialysis (HCC)    Gastroparesis    History of simultaneous kidney and pancreas transplant (HCC) 2014   Hypertension    Ischemic cerebrovascular accident (CVA) (HCC) 11/2021   Narcotic abuse (HCC)    Non compliance with medical treatment    Stroke [facility])    Vision loss    Significant Hospital Events: Including procedures, antibiotic start and stop dates in addition to other pertinent events   9/15 admitted with ICH plus IVH, EVD was placed for hydrocephalus 9/16 remain on clevidipine  infusion, tolerating spontaneous breathing trial, EVD is draining bloody fluid 9/17 continue to  require clevidipine  infusion, tolerated spontaneous breathing trial 9/18: No overnight issues. Currently off sedation. Still on high-dose `clevidipine  infusion 9/19: this am, dialysis ongoing. Neurosx plans to continue ventriculostomy for now. Remains on cleviprex  to maintain BP goal. Tmax 100.1. despite plt decrease 4T score 0. Will maintain heparin  as is for now and closely follow trend.  9/27 Spiking low grade fevers, wheezing on exam, CXR normal  9/30 No tolerating when EVD is raised. Still bloody fluid. CT head will be done. 10/1 -developed fevers pancultures drawn and broad-spectrum antibiotics started 10/2 became afebrile after starting antibiotics, did not tolerate raising EVD 10/3 serum potassium 6.5, receiving hemodialysis, no overnight issues.  Did not tolerate weaning EVD 10/4 EVD remain at 10, no issues 10/5 EVD remains at 10, afebrile 10/6 EVD was raised to 30 cm water , tolerating well, remained afebrile 10/7 EVD is at 30 cm water , CT scan will be done tomorrow morning, no overnight issues 10/8 she became somnolent, CT head showed increasing hydrocephalus, EVD was titrated down to 10 cm of water  with improvement in mental status  Interim History / Subjective:  EVD remains at 10 cm water  Remain afebrile   Objective:  Blood pressure (!) 158/78, pulse 97, temperature 99.1 F (37.3 C), temperature source Axillary, resp. rate 20, height 5' 3 (1.6 m), weight 51.3 kg, SpO2 100%.        Intake/Output Summary (Last 24 hours) at 06/12/2024 0939 Last data filed at 06/12/2024 0900 Gross per 24 hour  Intake 659.58 ml  Output 1540 ml  Net -880.42 ml   Filed Weights   06/11/24 0704 06/11/24 0815 06/12/24 0500  Weight: 50.8 kg 51  kg 51.3 kg   Physical Examination: General: Chronically ill-appearing female, lying on the bed HEENT: [place]/AT, eyes anicteric.  moist mucus membranes.  EVD in place Neuro: Opens eyes with vocal stimuli, remains aphasic, moving all 4 extremities, weaker on  left upper extremity Chest: Coarse breath sounds, no wheezes or rhonchi Heart: Regular rate and rhythm, no murmurs or gallops Abdomen: Soft, nontender, nondistended, bowel sounds present  Labs reviewed  Patient Lines/Drains/Airways Status     Active Line/Drains/Airways     Name Placement date Placement time Site Days   Peripheral IV 06/08/24 22 G 1.75 Anterior;Left Forearm 06/08/24  0612  Forearm  1   Fistula / Graft Right Upper arm Arteriovenous fistula 12/08/20  1403  Upper arm  1279   Gastrostomy/Enterostomy PEG-jejunostomy 18 Fr. LUQ 05/21/24  0827  LUQ  19   ICP/Ventriculostomy Ventricular drainage catheter Left Parietal region 05/19/24  1730  Parietal region  21   Fecal Management System 40 mL 06/02/24  1700  -- 7   Wound 05/19/24 1933 Pressure Injury Buttocks Bilateral Stage 1 -  Intact skin with non-blanchable redness of a localized area usually over a bony prominence. 05/19/24  1933  Buttocks  21         Resolved Hospital problems  Hypertensive emergency IV Acute respiratory insufficiency  thrombocytopenia of critical illness Serratia/klebsiella pneumonia Hyperkalemia Acute encephalopathy in the setting of ICH and hydrocephalus Narcotic abuse  Assessment & Plan:  Acute right parietotemporal intraparenchymal hemorrhage with IVH with ICH score 2 due to uncontrolled hypertension Obstructive hydrocephalus status post EVD, EVD was started down to 10 cm water  again as repeat head CT showing slightly increased hydrocephalus End-stage renal disease on hemodialysis with failed renal transplant but functional pancreatic transplant on immunosuppressive therapy Hypervolemic hyponatremia, hypochloremia  Type 1 diabetes, complicated with vasculopathy and gastroparesis- not on long term insulin  anymore post pancreatic transplant Moderate protein calorie malnutrition Anemia of chronic disease Stage 1 pressure ulcer POA Immunosuppression for pancreas kidney transplant Seizure  disorder Hypercalcemia   Continue neuro watch After head CT showing increased hydrocephalus, EVD was kept at 10 cm water  per neurosurgery recommendations CSF was sent which is negative for acute infection Plan for VP shunt by next week neurological exam remains same Remain aphasic but moving extremities purposefully Continue immunosuppressive therapy with cyclosporine , mycophenolate  and prednisone  Closely monitor electrolytes Blood sugars are controlled, continue sliding scale insulin  Continue dietary supplements with tube feeds Monitor H&H Continue wound care Continue oral antihypertensive meds Continue Vimpat  Closely monitor serum calcium      Valinda Novas, MD [place] [facility] See Amion for pager If no response to pager, please call [phone] until 7pm After 7pm, Please call E-link [phone]

## 2024-06-12 NOTE — Progress Notes (Signed)
 Patient ID: [name] [name], female   DOB: 11-May-1981, 43 y.o.   MRN: [id number] [facility] Progress Note   Subjective:    Seen in room.  Mom at bedside.  Pt's movements are more purposeful today- she is also talking to her mom as well   Objective:   BP (!) 128/54   Pulse (!) 116   Temp 98.6 F (37 C)   Resp (!) 28   Ht 5' 3 (1.6 m)   Wt 45 kg   SpO2 99%   BMI 17.57 kg/m   Physical Exam: Gen: Comfortably resting in bed, eyes closed +Ventriculostomy drain, still with a little blood in it. CVS: Pulse regular tachycardia, S1 and S2 normal Resp: Anteriorly clear to auscultation, no rales/rhonchi Abd: Soft, flat, nontender, bowel sounds normal Ext: No lower extremity edema RUA AVF+bruit    Assessment/ Plan:    OP HD: NW MWF 3h  B400  55.2kg  2K bath   AVF  Heparin  none Mircera 150 mcg q 2 wks, due 9/24  Acute right intracranial hemorrhage: Associated with uncontrolled hypertension and status post ventriculostomy.  Extubated 9/22. Ventriculostomy fluid remains bloody. Plan for VP shunt next week  ESRD/ hx of renal allograft failure: Continue HD on MWF. Next HD 06/13/24. 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 Getting darbe 100 mcg sq weekly.  CKD-MBD: Elevated calcium  and phosphorus levels noted. PTH level was low at 29. Suspect ^Ca++ due to immobility.  Nutrition: Ongoing tube feeds, monitor electrolytes/volume status. Hypertension: BP's were very high, then dropped low. Is now getting norvasc , clonidine  and coreg  on non-hd days only.  Volume: under dry wt 5-7kg. Tolerating usually 1- 2 L UF.  Dispo: spoke with mom- full scope care, full code.  She is hopeful for pt to get another  transplant.   [name] Bonine MD 06/12/2024, 11:21 AM  Recent Labs  Lab 06/08/24 0425 06/09/24 0640 06/11/24 0840 06/12/24 0442  HGB 8.6*   < > 8.4* 8.3*  ALBUMIN  2.9*  --  2.9*  --   CALCIUM  11.7*  --  11.3* 11.4*  PHOS  --   --  7.2*  --   CREATININE 4.42*  --  4.67* 2.92*  K 4.6  --  4.5 3.6   < > = values in this interval not displayed.    Inpatient medications:  amLODipine   10 mg Per Tube Once per day on Sunday Tuesday Thursday Saturday   carvedilol   25 mg Per Tube 2 times per day on Sunday Tuesday Thursday Saturday   Chlorhexidine  Gluconate Cloth  6 each Topical Q0600   cloNIDine   0.3 mg Per Tube 3 times per day on Sunday Tuesday Thursday Saturday   cycloSPORINE   200 mg Per Tube QPM   cycloSPORINE   225 mg Per Tube Daily   darbepoetin (ARANESP ) injection - DIALYSIS  100 mcg Subcutaneous Q Wed-1800   fiber  1 packet Per Tube TID   heparin  injection (subcutaneous)  5,000 Units Subcutaneous Q12H   hydrALAZINE   100 mg Per Tube 3 times per day on Sunday Tuesday Thursday Saturday   insulin  aspart  0-9 Units Subcutaneous Q4H   lacosamide   100 mg Per Tube BID   metoCLOPramide  (REGLAN ) injection  10 mg Intravenous Once  multivitamin  15 mL Per Tube Daily   mycophenolate   500 mg Per Tube BID   mouth rinse  15 mL Mouth Rinse 4 times per day   pantoprazole  (PROTONIX ) IV  40 mg Intravenous QHS   predniSONE   15 mg Per Tube Q breakfast    anticoagulant sodium citrate      feeding supplement (NEPRO CARB STEADY) Stopped (06/11/24 1905)   acetaminophen  **OR** acetaminophen  (TYLENOL ) oral liquid 160 mg/5 mL **OR** acetaminophen , alteplase , anticoagulant sodium citrate , heparin , labetalol , lidocaine  (PF), lidocaine -prilocaine , midodrine , mouth rinse, pentafluoroprop-tetrafluoroeth, sennosides

## 2024-06-12 NOTE — Progress Notes (Signed)
 Subjective: The patient is more alert and purposeful today.  Her mother is at the bedside.  Objective: Vital signs in last 24 hours: Temp:  [98 F (36.7 C)-98.7 F (37.1 C)] 98.5 F (36.9 C) (10/09 0400) Pulse Rate:  [88-100] 97 (10/09 0800) Resp:  [13-30] 20 (10/09 0800) BP: (88-184)/(49-96) 162/70 (10/09 0800) SpO2:  [98 %-100 %] 100 % (10/09 0800) Weight:  [51 kg-51.3 kg] 51.3 kg (10/09 0500) Estimated body mass index is 20.03 kg/m as calculated from the following:   Height as of this encounter: 5' 3 (1.6 m).   Weight as of this encounter: 51.3 kg.   Intake/Output from previous day: 10/08 0701 - 10/09 0700 In: 769.6 [NG/GT:719.6; IV Piggyback:50] Out: 1715 [Drains:215] Intake/Output this shift: Total I/O In: -  Out: 10 [Drains:10]  Physical exam patient is more alert and purposeful with the right upper extremity.  Her ventriculostomy is patent.  Lab Results: Recent Labs    06/11/24 0840 06/12/24 0442  WBC 3.4* 3.9*  HGB 8.4* 8.3*  HCT 26.0* 25.2*  PLT 158 146*   BMET Recent Labs    06/11/24 0840 06/12/24 0442  NA 129* 132*  K 4.5 3.6  CL 85* 88*  CO2 25 27  GLUCOSE 146* 104*  BUN 94* 45*  CREATININE 4.67* 2.92*  CALCIUM  11.3* 11.4*    Studies/Results: CT HEAD WO CONTRAST ( ) Result Date: 06/11/2024 EXAM: CT HEAD WITHOUT CONTRAST 06/11/2024 02:45:13 AM TECHNIQUE: CT of the head was performed without the administration of intravenous contrast. Automated exposure control, iterative reconstruction, and/or weight based adjustment of the mA/kV was utilized to reduce the radiation dose to as low as reasonably achievable. COMPARISON: None available. CLINICAL HISTORY: Hydrocephalus. FINDINGS: BRAIN AND VENTRICLES: Slightly decreased volume of intraventricular hemorrhage which is also decreased in attenuation. Mildly increased (now moderate) hydrocephalus. Similar position of the left frontal approach ventricular catheter. Consistent periventricular  hypoattenuation, suggestive of transependymal flow of CSF. Similar remote left cerebellar and left posterior MCA territory infarction. No extra-axial collection. No mass effect or midline shift. ORBITS: No acute abnormality. SINUSES: No acute abnormality. SOFT TISSUES AND SKULL: No acute soft tissue abnormality. No skull fracture. Other: Bilateral mastoid effusions. These results will be called to the ordering clinician or representative by the Radiologist Assistant, and communication documented in the PACS or [employer]. IMPRESSION: 1. Mildly increased (now moderate) hydrocephalus with periventricular hypoattenuation suggesting transependymal flow of CSF. Left frontal ventricular catheter in similar position. 2. Slightly decreased intraventricular hemorrhage. 3. Remote left cerebellar and left posterior MCA territory infarctions, unchanged. Electronically signed by: Gilmore Molt MD 06/11/2024 03:08 AM EDT RP Workstation: [id number]    Assessment/Plan: Intracerebral hemorrhage, intraventricular hemorrhage, hydrocephalus, ventriculostomy: Looks like the patient will need a ventriculoperitoneal shunt.  I described the procedure, the risks, benefits, alternatives, and expected postoperative course with the patient's mother.  I have answered all her questions.  She wants to proceed with placement of a shunt next week as planned.  Her CSF cultures are thus far negative.  LOS: 24 days     [name] [name] 06/12/2024, 8:11 AM     Patient ID: [name] [name], female   DOB: March 05, 1981, 43 y.o.   MRN: [id number]

## 2024-06-12 NOTE — TOC Progression Note (Signed)
 Transition of Care [facility]) - Progression Note    Patient Details  Name: [name] [name] MRN: [id number] Date of Birth: 11/03/1980  Transition of [facility]) CM/SW Contact  Inocente GORMAN Kindle, LCSW Phone Number: 06/12/2024, 2:06 PM  Clinical Narrative:    CSW continuing to follow. Planning for VP Shunt.    Expected Discharge Plan: Skilled Nursing Facility Barriers to Discharge: Continued Medical Work up, English as a second language teacher               Expected Discharge Plan and Services In-house Referral: Clinical Social Work   Post Acute Care Choice: Skilled Nursing Facility Living arrangements for the past 2 months: Apartment                                       Social Drivers of Health (SDOH) Interventions SDOH Screenings   Food Insecurity: Patient Unable To Answer (05/20/2024)  Housing: Patient Unable To Answer (05/20/2024)  Transportation Needs: Patient Unable To Answer (05/20/2024)  Utilities: Patient Unable To Answer (05/20/2024)  Social Connections: Unknown (10/04/2022)   Received from [facility]  Stress: No Stress Concern Present (04/16/2024)   Received from [facility]  Tobacco Use: Medium Risk (04/27/2024)    Readmission Risk Interventions    05/20/2024    4:16 PM 03/31/2024   11:27 AM 11/17/2021   12:15 PM  Readmission Risk Prevention Plan  Transportation Screening Complete Complete Complete  Medication Review Oceanographer) Complete Complete Complete  PCP or Specialist appointment within 3-5 days of discharge Complete  Complete  HRI or Home Care Consult Complete Complete Complete  SW Recovery Care/Counseling Consult Complete Complete Complete  Palliative Care Screening Complete Not Applicable Not Applicable  Skilled Nursing Facility Complete Not Applicable Patient Refused

## 2024-06-12 NOTE — Progress Notes (Signed)
 Nutrition Follow-up  DOCUMENTATION CODES:   Underweight, Non-severe (moderate) malnutrition in context of chronic illness  INTERVENTION:   Tube feeding via G-J tube: Nepro at 55 ml/h (1320 ml per day)  Provides 1980 kcal, 106 gm protein, 972 ml free water  daily   Nutrisource fiber TID  Medications likely contributing to diarrhea but has a J-tube so limited options for medication administration. Discussed with MD and Pharmacy.   NUTRITION DIAGNOSIS:   Moderate Malnutrition related to chronic illness (ESRD on HD/DM and gastroparesis) as evidenced by severe muscle depletion, mild fat depletion. Ongoing.   GOAL:   Patient will meet greater than or equal to 90% of their needs Met with TF at goal  MONITOR:   Diet advancement, Vent status, Labs, I & O's, TF tolerance  REASON FOR ASSESSMENT:   Consult Enteral/tube feeding initiation and management  ASSESSMENT:   43 year old female who presented as a Code Stroke with IVH. 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 Recent admissions 6/11-7/28 and 7/29-8/13 for ICH with GJ tube.  Pt discussed during ICU rounds and with RN and MD.  TF held earlier due to vomiting but pt with G-J tube. MD did order one dose of Reglan  due to hx gastroparesis.  Pt has consistently had emesis episodes during this and previous admissions.  Per team plan for shunt next week.   6/11 - 7/29: Hospital admission, discharge to [facility] 8/12 - G tube converted to GJ  9/16 - Admitted, EVD placed, intubated 9/21 - switched to Nepro formula; FMS placed 9/22 - Extubated  9/23 - adjusted rate of Nepro to meet needs (was previously exceeding) 9/25 - D/C Nepro, Started Osmolite 1.5 10/3 - Renal changed TF to Nepro due to higher K (likely due to no HD over weekend)  Medications reviewed and  include: aranesp  every Wed, SSI every 4 hours, nutrisource fiber TID, liquid MVI daily, cellcept  oral suspension BID, protonix , prednisone  Nepro @ 55 ml/hr  Labs reviewed:  Na 132 K 3.6 Copper 66 CRP 0.6 Vitamin C 1.6 Zinc  74 CBG's: 102-183  EVD: 215 ml  18 F G-J tube UF: 1500 ml   Diet Order:   Diet Order             Diet NPO time specified  Diet effective now                   EDUCATION NEEDS:   Not appropriate for education at this time  Skin:  Skin Assessment: Skin Integrity Issues: Skin Integrity Issues:: Stage I Stage I: buttocks  Last BM:  0 via FMS per RN output in tube  Height:   Ht Readings from Last 1 Encounters:  05/21/24 5' 3 (1.6 m)    Weight:   Wt Readings from Last 1 Encounters:  06/12/24 51.3 kg    BMI:  Body mass index is 20.03 kg/m.  Estimated Nutritional Needs:   Kcal:  1700-1900  Protein:  80-100 grams  Fluid:  1L +UOP  Chauna Osoria P., RD, LDN, CNSC See AMiON for contact information

## 2024-06-12 NOTE — Progress Notes (Signed)
 Notified CCM at 0330 due to BP being out of range despite PRN labetalol .  Order for one time dose of hydralazine  placed by CCM.

## 2024-06-13 DIAGNOSIS — E7220 Disorder of urea cycle metabolism, unspecified: Secondary | ICD-10-CM

## 2024-06-13 DIAGNOSIS — N186 End stage renal disease: Secondary | ICD-10-CM | POA: Diagnosis not present

## 2024-06-13 DIAGNOSIS — G911 Obstructive hydrocephalus: Secondary | ICD-10-CM | POA: Diagnosis not present

## 2024-06-13 DIAGNOSIS — I619 Nontraumatic intracerebral hemorrhage, unspecified: Secondary | ICD-10-CM | POA: Diagnosis not present

## 2024-06-13 DIAGNOSIS — Z992 Dependence on renal dialysis: Secondary | ICD-10-CM | POA: Diagnosis not present

## 2024-06-13 LAB — GLUCOSE, CAPILLARY
Glucose-Capillary: 119 mg/dL — ABNORMAL HIGH (ref 70–99)
Glucose-Capillary: 130 mg/dL — ABNORMAL HIGH (ref 70–99)
Glucose-Capillary: 132 mg/dL — ABNORMAL HIGH (ref 70–99)
Glucose-Capillary: 142 mg/dL — ABNORMAL HIGH (ref 70–99)
Glucose-Capillary: 205 mg/dL — ABNORMAL HIGH (ref 70–99)
Glucose-Capillary: 213 mg/dL — ABNORMAL HIGH (ref 70–99)

## 2024-06-13 MED ORDER — NEPRO/CARBSTEADY PO LIQD
237.0000 mL | ORAL | Status: DC | PRN
  Filled 2024-06-13: qty 237

## 2024-06-13 MED ORDER — ADULT MULTIVITAMIN W/MINERALS CH
1.0000 | ORAL_TABLET | Freq: Every day | ORAL | Status: DC
  Administered 2024-06-14 – 2024-08-08 (×53): 1
  Filled 2024-06-13 (×54): qty 1

## 2024-06-13 MED ORDER — LIDOCAINE-PRILOCAINE 2.5-2.5 % EX CREA: 1.0000 | TOPICAL_CREAM | CUTANEOUS | Status: DC | PRN

## 2024-06-13 MED ORDER — ANTICOAGULANT SODIUM CITRATE 4% (200MG/5ML) IV SOLN: 5.0000 mL | Status: DC | PRN

## 2024-06-13 MED ORDER — LIDOCAINE HCL (PF) 1 % IJ SOLN: 5.0000 mL | INTRAMUSCULAR | Status: DC | PRN

## 2024-06-13 MED ORDER — PENTAFLUOROPROP-TETRAFLUOROETH EX AERO: 1.0000 | INHALATION_SPRAY | CUTANEOUS | Status: DC | PRN

## 2024-06-13 MED ORDER — HEPARIN SODIUM (PORCINE) 1000 UNIT/ML DIALYSIS: 1000.0000 [IU] | INTRAMUSCULAR | Status: DC | PRN

## 2024-06-13 NOTE — Procedures (Signed)
 Patient seen and examined on Hemodialysis. The procedure was supervised and I have made appropriate changes. BP 115/62 (BP Location: Left Arm)   Pulse 89   Temp 98.2 F (36.8 C) (Axillary)   Resp 16   Ht 5' 3 (1.6 m)   Wt 51 kg   SpO2 100%   BMI 19.92 kg/m   QB 400 mL/ min via AVF, UF goal 1L (initially was 2L but lowered the goal in the setting of some hypotension)  Tolerating treatment without complaints at this time.   [name] Bonine MD [facility] Pgr [phone] 12:25 PM

## 2024-06-13 NOTE — Progress Notes (Signed)
 The patient completed HD treatment without issue and goal met.  06/13/24 1216  Vitals  Temp 98.2 F (36.8 C)  Temp Source Axillary  BP 115/62  BP Location Left Arm  BP Method Automatic  Patient Position (if appropriate) Lying  Resp 16  Weight 51 kg  Type of Weight Post-Dialysis  Oxygen  Therapy  O2 Device Room Air  During Treatment Monitoring  Intra-Hemodialysis Comments Tx completed  Post Treatment  Dialyzer Clearance Lightly streaked  Hemodialysis Intake (mL) 0 mL  Liters Processed 70  Fluid Removed (mL) 1100 mL  Tolerated HD Treatment Yes  Post-Hemodialysis Comments see notes  AVG/AVF Arterial Site Held (minutes) 7 minutes  AVG/AVF Venous Site Held (minutes) 7 minutes  Fistula / Graft Right Upper arm Arteriovenous fistula  Placement Date/Time: (c) 12/08/20 1403   Placed prior to admission: Yes  Orientation: Right  Access Location: Upper arm  Access Type: Arteriovenous fistula  Site Condition No complications  Fistula / Graft Assessment Present;Thrill;Bruit  Status Deaccessed  Needle Size 15  Drainage Description None

## 2024-06-13 NOTE — Progress Notes (Signed)
 Patient ID: [name] [name], female   DOB: 07/15/1981, 43 y.o.   MRN: [id number] [facility] Progress Note   Subjective:    Seen in room.  Mom at bedside.  Talking to her mom and able to respond to me- she said hello and opened her eyes.     Objective:   BP (!) 128/54   Pulse (!) 116   Temp 98.6 F (37 C)   Resp (!) 28   Ht 5' 3 (1.6 m)   Wt 45 kg   SpO2 99%   BMI 17.57 kg/m   Physical Exam: Gen: Comfortably resting in bed, eyes closed +Ventriculostomy drain, still with a little blood in it. CVS: Pulse regular tachycardia, S1 and S2 normal Resp: Anteriorly clear to auscultation, no rales/rhonchi Abd: Soft, flat, nontender, bowel sounds normal Ext: No lower extremity edema RUA AVF+bruit    Assessment/ Plan:    OP HD: NW MWF 3h  B400  55.2kg  2K bath   AVF  Heparin  none Mircera 150 mcg q 2 wks, due 9/24  Acute right intracranial hemorrhage: Associated with uncontrolled hypertension and status post ventriculostomy.  Extubated 9/22. Ventriculostomy fluid remains bloody. Plan for VP shunt next week  ESRD/ hx of renal allograft failure: Continue HD on MWF. Next HD 06/13/24. 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 Getting darbe 100 mcg sq weekly.  CKD-MBD: Elevated calcium  and phosphorus levels noted. PTH level was low at 29. Suspect ^Ca++ due to immobility.  Nutrition: Ongoing tube feeds, monitor electrolytes/volume status. Hypertension: BP's were very high, then dropped low. Is now getting norvasc , clonidine  and coreg  on non-hd days only.  Volume: under dry wt 5-7kg. Tolerating usually 1- 2 L UF.  Dispo: spoke with mom- full scope care, full code.  She is hopeful for pt to get  another transplant.   [name] Bonine MD 06/13/2024, 12:25 PM  Recent Labs  Lab 06/08/24 0425 06/09/24 0640 06/11/24 0840 06/12/24 0442  HGB 8.6*   < > 8.4* 8.3*  ALBUMIN  2.9*  --  2.9*  --   CALCIUM  11.7*  --  11.3* 11.4*  PHOS  --   --  7.2*  --   CREATININE 4.42*  --  4.67* 2.92*  K 4.6  --  4.5 3.6   < > = values in this interval not displayed.    Inpatient medications:  amLODipine   10 mg Per Tube Once per day on Sunday Tuesday Thursday Saturday   carvedilol   25 mg Per Tube 2 times per day on Sunday Tuesday Thursday Saturday   Chlorhexidine  Gluconate Cloth  6 each Topical Q0600   cloNIDine   0.3 mg Per Tube 3 times per day on Sunday Tuesday Thursday Saturday   cycloSPORINE   200 mg Per Tube QPM   cycloSPORINE   225 mg Per Tube Daily   darbepoetin (ARANESP ) injection - DIALYSIS  100 mcg Subcutaneous Q Wed-1800   fiber  1 packet Per Tube TID   heparin  injection (subcutaneous)  5,000 Units Subcutaneous Q12H   hydrALAZINE   100 mg Per Tube 3 times per day on Sunday Tuesday Thursday Saturday   insulin  aspart  0-9 Units Subcutaneous Q4H   lacosamide   100 mg Per Tube BID   [START ON 06/14/2024] multivitamin with minerals  1 tablet Per Tube Daily   mycophenolate   500 mg Per Tube BID   mouth rinse  15 mL Mouth Rinse 4 times per day   pantoprazole  (PROTONIX ) IV  40 mg Intravenous QHS   predniSONE   15 mg Per Tube Q breakfast    anticoagulant sodium citrate      anticoagulant sodium citrate      feeding supplement (NEPRO CARB STEADY) 55 mL/hr at 06/13/24 1000   acetaminophen  **OR** acetaminophen  (TYLENOL ) oral liquid 160 mg/5 mL **OR** acetaminophen , alteplase , anticoagulant sodium citrate , anticoagulant sodium citrate , feeding supplement (NEPRO CARB STEADY), heparin , heparin , labetalol , lidocaine  (PF), lidocaine  (PF), lidocaine -prilocaine , lidocaine -prilocaine , midodrine , mouth rinse, pentafluoroprop-tetrafluoroeth, pentafluoroprop-tetrafluoroeth, sennosides

## 2024-06-13 NOTE — Progress Notes (Signed)
 No drain output from 1300 to 1500, Dr. Mavis notified and ordered no changes unless neuro status declines.

## 2024-06-13 NOTE — Progress Notes (Signed)
 Subjective: Patient is in no apparent distress.  Her mother is at the bedside.  Objective: Vital signs in last 24 hours: Temp:  [98.1 F (36.7 C)-99.3 F (37.4 C)] 98.1 F (36.7 C) (10/10 0400) Pulse Rate:  [83-98] 83 (10/10 0600) Resp:  [14-25] 23 (10/10 0600) BP: (101-162)/(49-78) 117/58 (10/10 0600) SpO2:  [100 %] 100 % (10/10 0600) Weight:  [52 kg] 52 kg (10/10 0455) Estimated body mass index is 20.31 kg/m as calculated from the following:   Height as of this encounter: 5' 3 (1.6 m).   Weight as of this encounter: 52 kg.   Intake/Output from previous day: 10/09 0701 - 10/10 0700 In: 990 [NG/GT:990] Out: 217 [Drains:217] Intake/Output this shift: Total I/O In: 880 [NG/GT:880] Out: 148 [Drains:148]  Physical exam the patient is more alert this morning.  She said a few words to me.  Lab Results: Recent Labs    06/11/24 0840 06/12/24 0442  WBC 3.4* 3.9*  HGB 8.4* 8.3*  HCT 26.0* 25.2*  PLT 158 146*   BMET Recent Labs    06/11/24 0840 06/12/24 0442  NA 129* 132*  K 4.5 3.6  CL 85* 88*  CO2 25 27  GLUCOSE 146* 104*  BUN 94* 45*  CREATININE 4.67* 2.92*  CALCIUM  11.3* 11.4*    Studies/Results: No results found.  Assessment/Plan: Intracerebral hemorrhage, intraventricular hemorrhage, hydrocephalus: The patient's CSF cultures are negative.  The plan is to place a ventriculoperitoneal shunt next week to allow her spinal fluid to clear the red blood cells a bit more to decrease the likelihood of a shunt malfunction.  I have discussed this with the patient's mother and answered all her questions.  LOS: 25 days     [name] [name] 06/13/2024, 6:53 AM     Patient ID: [name] [name], female   DOB: June 26, 1981, 43 y.o.   MRN: [id number]

## 2024-06-13 NOTE — Progress Notes (Signed)
 NAME:  Nykayla Marcelli, MRN:  [id number], DOB:  May 17, 1981, LOS: 25 ADMISSION DATE:  05/19/2024 CONSULTATION DATE:  9/15/20025 REFERRING MD: Patsey - EDP, CHIEF COMPLAINT:  Code Stroke, ICH   History of Present Illness:  43 year old woman who presented to [facility] ED 9/15 as a Code Stroke. 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 Recent admissions 6/11-7/28 and 7/29-8/13 for ICH.  Patient presented to [facility] ED from dialysis as a Code Stroke. CT Head demonstrated large volume IVH with moderate hydrocephalus, acute hemorrhage of medial R temporal lobe and small foci of residual/recurrent hemorrhage in the L cerebellar hemisphere.   Patient was seen by stroke team and neurosurgery, EVD was placed on left side, PCCM was consulted for medical evaluation and admission to neuro ICU  Pertinent Medical History:   Past Medical History:  Diagnosis Date   Anemia    Anxiety    DM (diabetes mellitus), type 1 (HCC)    ESRD on hemodialysis (HCC)    Gastroparesis    History of simultaneous kidney and pancreas transplant (HCC) 2014   Hypertension    Ischemic cerebrovascular accident (CVA) (HCC) 11/2021   Narcotic abuse (HCC)    Non compliance with medical treatment    Stroke [facility])    Vision loss    Significant Hospital Events: Including procedures, antibiotic start and stop dates in addition to other pertinent events   9/15 admitted with ICH plus IVH, EVD was placed for hydrocephalus, intubated 9/16 remain on clevidipine  infusion, EVD is draining bloody fluid 9/17 continue to require clevidipine  infusion, tolerated spontaneous breathing trial 9/19: this am, dialysis ongoing. Neurosx plans to continue ventriculostomy for now. Remains on cleviprex  to maintain BP goal. Tmax 100.1. despite plt decrease 4T score 0. Will maintain heparin  as is for  now and closely follow trend.  9/22 palliative care meeting.  Family is insistent that they want full scope of care 9/23 Extubated 9/27 Spiking low grade fevers, wheezing on exam, CXR normal  9/30 No tolerating when EVD is raised. Still bloody fluid. CT head will be done. 10/1 -developed fevers pancultures drawn and broad-spectrum antibiotics started 10/2 became afebrile after starting antibiotics, did not tolerate raising EVD 10/3 serum potassium 6.5, receiving hemodialysis, no overnight issues.  Did not tolerate weaning EVD 10/4 EVD remain at 10, no issues 10/5 EVD remains at 10, afebrile 10/6 EVD was raised to 30 cm water , tolerating well, remained afebrile 10/7 EVD is at 30 cm water , CT scan will be done tomorrow morning, no overnight issues 10/8 she became somnolent, CT head showed increasing hydrocephalus, EVD was titrated down to 10 cm of water  with improvement in mental status  Interim History / Subjective:   No acute events.  About to get hemodialysis  Objective:  Blood pressure 133/63, pulse 82, temperature 98 F (36.7 C), temperature source Axillary, resp. rate 19, height 5' 3 (1.6 m), weight 52 kg, SpO2 99%.        Intake/Output Summary (Last 24 hours) at 06/13/2024 0904 Last data filed at 06/13/2024 0800 Gross per 24 hour  Intake 1100 ml  Output 203 ml  Net 897 ml   Filed Weights   06/12/24 0500 06/13/24 0455 06/13/24 0855  Weight: 51.3 kg 52 kg 52 kg   Physical Examination: General: Chronically ill-appearing in NAD. HEENT: [place] /AT, anicteric sclera, PERRL, moist mucous membranes. Neuro: Opens eyes to stimuli.  Aphasic.  Moving all extremities. PULM: Breathing even and unlabored. Lung fields clear. GI: Soft, nontender, nondistended. Normoactive bowel sounds. Extremities: No LE edema noted. Skin: Warm/dry.    Labs reviewed.  Significant for Sodium 132, BUN/creatinine 45/2.92 WBC 3.9, hemoglobin 8.3, platelets 146 No new imaging  Patient  Lines/Drains/Airways Status     Active Line/Drains/Airways     Name Placement date Placement time Site Days   Peripheral IV 06/08/24 22 G 1.75 Anterior;Left Forearm 06/08/24  0612  Forearm  1   Fistula / Graft Right Upper arm Arteriovenous fistula 12/08/20  1403  Upper arm  1279   Gastrostomy/Enterostomy PEG-jejunostomy 18 Fr. LUQ 05/21/24  0827  LUQ  19   ICP/Ventriculostomy Ventricular drainage catheter Left Parietal region 05/19/24  1730  Parietal region  21   Fecal Management System 40 mL 06/02/24  1700  -- 7   Wound 05/19/24 1933 Pressure Injury Buttocks Bilateral Stage 1 -  Intact skin with non-blanchable redness of a localized area usually over a bony prominence. 05/19/24  1933  Buttocks  21         Resolved Hospital problems  Hypertensive emergency IV Acute respiratory insufficiency  thrombocytopenia of critical illness Serratia/klebsiella pneumonia Hyperkalemia Acute encephalopathy in the setting of ICH and hydrocephalus Narcotic abuse  Assessment & Plan:  Acute right parietotemporal intraparenchymal hemorrhage with IVH with ICH score 2 due to uncontrolled hypertension Obstructive hydrocephalus status post EVD Seizure disorder Continue EVD at 10 cm water .  Has not tolerated weaning of EVD Plan for VP shunt next week per neurosurgery after allowing spinal fluid to clear RBCs Continue Vimpat  Neuro monitoring  End-stage renal disease on hemodialysis with failed renal transplant but functional pancreatic transplant on immunosuppressive therapy Hypervolemic hyponatremia, hypochloremia, hypercalcemia Continue dialysis per nephrology Antihypertension medication-Norvasc , clonidine , Coreg , hydralazine  on non HD days only Continue cyclosporine , mycophenolate , prednisone   Type 1 diabetes, complicated with vasculopathy and gastroparesis- not on long term insulin  anymore post pancreatic transplant Moderate protein calorie malnutrition Anemia of chronic disease Monitor blood  sugars, continue sliding scale insulin  Monitor CBC  Stage 1 pressure ulcer POA Wound care  Signature:   Lonna Coder MD [facility] See Amion for pager  If no response to pager , please call [phone] until 7pm After 7:00 pm call Elink  [phone] 06/13/2024, 10:04 AM

## 2024-06-14 DIAGNOSIS — Z992 Dependence on renal dialysis: Secondary | ICD-10-CM | POA: Diagnosis not present

## 2024-06-14 DIAGNOSIS — G911 Obstructive hydrocephalus: Secondary | ICD-10-CM | POA: Diagnosis not present

## 2024-06-14 DIAGNOSIS — I619 Nontraumatic intracerebral hemorrhage, unspecified: Secondary | ICD-10-CM | POA: Diagnosis not present

## 2024-06-14 DIAGNOSIS — N186 End stage renal disease: Secondary | ICD-10-CM | POA: Diagnosis not present

## 2024-06-14 LAB — GLUCOSE, CAPILLARY
Glucose-Capillary: 124 mg/dL — ABNORMAL HIGH (ref 70–99)
Glucose-Capillary: 140 mg/dL — ABNORMAL HIGH (ref 70–99)
Glucose-Capillary: 140 mg/dL — ABNORMAL HIGH (ref 70–99)
Glucose-Capillary: 150 mg/dL — ABNORMAL HIGH (ref 70–99)
Glucose-Capillary: 190 mg/dL — ABNORMAL HIGH (ref 70–99)
Glucose-Capillary: 227 mg/dL — ABNORMAL HIGH (ref 70–99)

## 2024-06-14 LAB — CBC
HCT: 27.2 % — ABNORMAL LOW (ref 36.0–46.0)
Hemoglobin: 8.8 g/dL — ABNORMAL LOW (ref 12.0–15.0)
MCH: 31.3 pg (ref 26.0–34.0)
MCHC: 32.4 g/dL (ref 30.0–36.0)
MCV: 96.8 fL (ref 80.0–100.0)
Platelets: 158 K/uL (ref 150–400)
RBC: 2.81 MIL/uL — ABNORMAL LOW (ref 3.87–5.11)
RDW: 15.4 % (ref 11.5–15.5)
WBC: 4 K/uL (ref 4.0–10.5)
nRBC: 0 % (ref 0.0–0.2)

## 2024-06-14 LAB — BASIC METABOLIC PANEL WITH GFR
Anion gap: 19 — ABNORMAL HIGH (ref 5–15)
BUN: 48 mg/dL — ABNORMAL HIGH (ref 6–20)
CO2: 25 mmol/L (ref 22–32)
Calcium: 11.3 mg/dL — ABNORMAL HIGH (ref 8.9–10.3)
Chloride: 89 mmol/L — ABNORMAL LOW (ref 98–111)
Creatinine, Ser: 3.13 mg/dL — ABNORMAL HIGH (ref 0.44–1.00)
GFR, Estimated: 18 mL/min — ABNORMAL LOW (ref >60)
Glucose, Bld: 114 mg/dL — ABNORMAL HIGH (ref 70–99)
Potassium: 3.8 mmol/L (ref 3.5–5.1)
Sodium: 133 mmol/L — ABNORMAL LOW (ref 135–145)

## 2024-06-14 LAB — CSF CULTURE W GRAM STAIN: Culture: NO GROWTH

## 2024-06-14 NOTE — Progress Notes (Signed)
 eLink Physician-Brief Progress Note Patient Name: [name] [name] DOB: 06/21/1981 MRN: [id number]   Date of Service  06/14/2024  HPI/Events of Note  Hypotensive on hydralazine , Coreg , Pressors  eICU Interventions  Add holding parameters     Intervention Category Intermediate Interventions: Hypotension - evaluation and management  Danaiya Steadman 06/14/2024, 9:46 PM

## 2024-06-14 NOTE — Progress Notes (Signed)
 Subjective: Nae o/n  Objective: Vital signs in last 24 hours: Temp:  [97.5 F (36.4 C)-98.8 F (37.1 C)] 98.8 F (37.1 C) (10/12 0747) Pulse Rate:  [83-98] 87 (10/12 0800) Resp:  [0-28] 23 (10/12 0800) BP: (95-159)/(48-72) 130/62 (10/12 0800) SpO2:  [100 %] 100 % (10/12 0800) Weight:  [52.4 kg] 52.4 kg (10/12 0500) Estimated body mass index is 20.46 kg/m as calculated from the following:   Height as of this encounter: 5' 3 (1.6 m).   Weight as of this encounter: 52.4 kg.   Intake/Output from previous day: 10/11 0701 - 10/12 0700 In: 1320 [NG/GT:1320] Out: 121 [Drains:121] Intake/Output this shift: Total I/O In: 55 [NG/GT:55] Out: -  Somnolent but arousable. Pupils equal. EVD fluctuating appropriately.  Lab Results: Recent Labs    06/14/24 0534 06/15/24 0357  WBC 4.0 3.5*  HGB 8.8* 8.5*  HCT 27.2* 26.6*  PLT 158 162   BMET Recent Labs    06/14/24 0534 06/15/24 0357  NA 133* 133*  K 3.8 4.1  CL 89* 88*  CO2 25 27  GLUCOSE 114* 138*  BUN 48* 80*  CREATININE 3.13* 4.51*  CALCIUM  11.3* 11.9*    Studies/Results: No results found.  Assessment/Plan: Intracerebral hemorrhage, intraventricular hemorrhage, hydrocephalus: The patient's CSF cultures are negative.  The plan is to place a ventriculoperitoneal shunt next week to allow her spinal fluid to clear the red blood cells a bit more to decrease the likelihood of a shunt malfunction.  I have discussed this with the patient's mother and answered all her questions.  Shunt planned originally planned for 10/13, although will have to schedule a new date per general surgery Supportive cares per ICU team, appreciate assist  LOS: 27 days    [name] [name] 06/15/2024, 8:59 AM     Patient ID: [name] [name], female   DOB: 1981-06-15, 43 y.o.   MRN: [id number]

## 2024-06-14 NOTE — Progress Notes (Signed)
 Patient ID: [name] [name], female   DOB: 18-Feb-1981, 43 y.o.   MRN: [id number] [facility] Progress Note   Subjective:    Seen in room.  Sleeping, eyes closed.  No complaints     Objective:   BP (!) 128/54   Pulse (!) 116   Temp 98.6 F (37 C)   Resp (!) 28   Ht 5' 3 (1.6 m)   Wt 45 kg   SpO2 99%   BMI 17.57 kg/m   Physical Exam: Gen: Comfortably resting in bed, eyes closed +Ventriculostomy drain, still with a little blood in it. CVS: Pulse regular tachycardia, S1 and S2 normal Resp: Anteriorly clear to auscultation, no rales/rhonchi Abd: Soft, flat, nontender, bowel sounds normal Ext: No lower extremity edema RUA AVF+bruit    Assessment/ Plan:    OP HD: [facility] MWF 3h  B400  55.2kg  2K bath   AVF  Heparin  none Mircera 150 mcg q 2 wks, due 9/24  Acute right intracranial hemorrhage: Associated with uncontrolled hypertension and status post ventriculostomy.  Extubated 9/22. Ventriculostomy fluid remains bloody. Plan for VP shunt next week  ESRD/ hx of renal allograft failure: Continue HD on MWF. Next HD 06/16/24. 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 Getting darbe 100 mcg sq weekly.  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  Ca up, suspect immobility, may need a dose of pamidronate  Nutrition: Ongoing tube feeds, monitor electrolytes/volume status. Hypertension: BP's were very high, then dropped low. Is now getting norvasc , clonidine  and coreg  on non-hd days only.  Volume: under dry wt 5-7kg. Tolerating usually 1- 2 L UF.  Dispo: spoke with mom- full scope care, full code.  She is hopeful for pt to get another transplant.   [name] Bonine MD 06/14/2024, 12:03 PM  Recent Labs   Lab 06/08/24 0425 06/09/24 0640 06/11/24 0840 06/12/24 0442 06/14/24 0534  HGB 8.6*   < > 8.4* 8.3* 8.8*  ALBUMIN  2.9*  --  2.9*  --   --   CALCIUM  11.7*  --  11.3* 11.4* 11.3*  PHOS  --   --  7.2*  --   --   CREATININE 4.42*  --  4.67* 2.92* 3.13*  K 4.6  --  4.5 3.6 3.8   < > = values in this interval not displayed.    Inpatient medications:  amLODipine   10 mg Per Tube Once per day on Sunday Tuesday Thursday Saturday   carvedilol   25 mg Per Tube 2 times per day on Sunday Tuesday Thursday Saturday   Chlorhexidine  Gluconate Cloth  6 each Topical Q0600   cloNIDine   0.3 mg Per Tube 3 times per day on Sunday Tuesday Thursday Saturday   cycloSPORINE   200 mg Per Tube QPM   cycloSPORINE   225 mg Per Tube Daily   darbepoetin (ARANESP ) injection - DIALYSIS  100 mcg Subcutaneous Q Wed-1800   fiber  1 packet Per Tube TID   heparin  injection (subcutaneous)  5,000 Units Subcutaneous Q12H   hydrALAZINE   100 mg Per Tube 3 times per day on Sunday Tuesday Thursday Saturday   insulin  aspart  0-9 Units Subcutaneous Q4H   lacosamide   100 mg Per Tube BID   multivitamin with minerals  1 tablet Per Tube Daily  mycophenolate   500 mg Per Tube BID   mouth rinse  15 mL Mouth Rinse 4 times per day   pantoprazole  (PROTONIX ) IV  40 mg Intravenous QHS   predniSONE   15 mg Per Tube Q breakfast    anticoagulant sodium citrate      anticoagulant sodium citrate      feeding supplement (NEPRO CARB STEADY) 55 mL/hr at 06/14/24 0700   acetaminophen  **OR** acetaminophen  (TYLENOL ) oral liquid 160 mg/5 mL **OR** acetaminophen , alteplase , anticoagulant sodium citrate , anticoagulant sodium citrate , feeding supplement (NEPRO CARB STEADY), heparin , heparin , labetalol , lidocaine  (PF), lidocaine  (PF), lidocaine -prilocaine , lidocaine -prilocaine , midodrine , mouth rinse, pentafluoroprop-tetrafluoroeth, pentafluoroprop-tetrafluoroeth, sennosides

## 2024-06-14 NOTE — Progress Notes (Signed)
 Subjective: Neurologically stable  Objective: Vital signs in last 24 hours: Temp:  [97.5 F (36.4 C)-98.9 F (37.2 C)] 98.2 F (36.8 C) (10/11 0800) Pulse Rate:  [82-97] 96 (10/11 0700) Resp:  [11-28] 23 (10/11 0700) BP: (89-152)/(50-75) 149/71 (10/11 0700) SpO2:  [99 %-100 %] 100 % (10/11 0700) Weight:  [51 kg-52.2 kg] 52.2 kg (10/11 0359) Estimated body mass index is 20.39 kg/m as calculated from the following:   Height as of this encounter: 5' 3 (1.6 m).   Weight as of this encounter: 52.2 kg.   Intake/Output from previous day: 10/10 0701 - 10/11 0700 In: 1155 [NG/GT:1155] Out: 1282 [Drains:129; Stool:53] Intake/Output this shift: No intake/output data recorded. Somnolent but arousable. Pupils equal. EVD fluctuating appropriately.  Lab Results: Recent Labs    06/12/24 0442 06/14/24 0534  WBC 3.9* 4.0  HGB 8.3* 8.8*  HCT 25.2* 27.2*  PLT 146* 158   BMET Recent Labs    06/12/24 0442 06/14/24 0534  NA 132* 133*  K 3.6 3.8  CL 88* 89*  CO2 27 25  GLUCOSE 104* 114*  BUN 45* 48*  CREATININE 2.92* 3.13*  CALCIUM  11.4* 11.3*    Studies/Results: No results found.  Assessment/Plan: Intracerebral hemorrhage, intraventricular hemorrhage, hydrocephalus: The patient's CSF cultures are negative.  The plan is to place a ventriculoperitoneal shunt next week to allow her spinal fluid to clear the red blood cells a bit more to decrease the likelihood of a shunt malfunction.  I have discussed this with the patient's mother and answered all her questions.  Shunt planned for 10/13. Preop orders signed and held Supportive cares per ICU team, appreciate assist  LOS: 26 days     [name] [name] 06/14/2024, 9:03 AM     Patient ID: [name] [name], female   DOB: 11/21/1980, 43 y.o.   MRN: [id number]

## 2024-06-14 NOTE — Progress Notes (Signed)
 NAME:  [name] [name], MRN:  [id number], DOB:  11/16/1980, LOS: 26 ADMISSION DATE:  05/19/2024 CONSULTATION DATE:  9/15/20025 REFERRING MD: Patsey - EDP, CHIEF COMPLAINT:  Code Stroke, ICH   History of Present Illness:  43 year old woman who presented to [facility] ED 9/15 as a Code Stroke. 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 Recent admissions 6/11-7/28 and 7/29-8/13 for ICH.  Patient presented to [facility] ED from dialysis as a Code Stroke. CT Head demonstrated large volume IVH with moderate hydrocephalus, acute hemorrhage of medial R temporal lobe and small foci of residual/recurrent hemorrhage in the L cerebellar hemisphere.   Patient was seen by stroke team and neurosurgery, EVD was placed on left side, PCCM was consulted for medical evaluation and admission to neuro ICU  Pertinent Medical History:   Past Medical History:  Diagnosis Date   Anemia    Anxiety    DM (diabetes mellitus), type 1 (HCC)    ESRD on hemodialysis (HCC)    Gastroparesis    History of simultaneous kidney and pancreas transplant (HCC) 2014   Hypertension    Ischemic cerebrovascular accident (CVA) (HCC) 11/2021   Narcotic abuse (HCC)    Non compliance with medical treatment    Stroke [facility])    Vision loss    Significant Hospital Events: Including procedures, antibiotic start and stop dates in addition to other pertinent events   9/15 admitted with ICH plus IVH, EVD was placed for hydrocephalus, intubated 9/16 remain on clevidipine  infusion, EVD is draining bloody fluid 9/17 continue to require clevidipine  infusion, tolerated spontaneous breathing trial 9/19: this am, dialysis ongoing. Neurosx plans to continue ventriculostomy for now. Remains on cleviprex  to maintain BP goal. Tmax 100.1. despite plt decrease 4T score 0. Will maintain heparin  as is for  now and closely follow trend.  9/22 palliative care meeting.  Family is insistent that they want full scope of care 9/23 Extubated 9/27 Spiking low grade fevers, wheezing on exam, CXR normal  9/30 No tolerating when EVD is raised. Still bloody fluid. CT head will be done. 10/1 -developed fevers pancultures drawn and broad-spectrum antibiotics started 10/2 became afebrile after starting antibiotics, did not tolerate raising EVD 10/3 serum potassium 6.5, receiving hemodialysis, no overnight issues.  Did not tolerate weaning EVD 10/4 EVD remain at 10, no issues 10/5 EVD remains at 10, afebrile 10/6 EVD was raised to 30 cm water , tolerating well, remained afebrile 10/7 EVD is at 30 cm water , CT scan will be done tomorrow morning, no overnight issues 10/8 she became somnolent, CT head showed increasing hydrocephalus, EVD was titrated down to 10 cm of water  with improvement in mental status  Interim History / Subjective:   Completed hemodialysis yesterday Respiratory status is stable More awake and verbal over the past 24 hours per mother  Objective:  Blood pressure (!) 149/71, pulse 96, temperature 98.2 F (36.8 C), temperature source Axillary, resp. rate (!) 23, height 5' 3 (1.6 m), weight 52.2 kg, SpO2 100%.        Intake/Output Summary (Last 24 hours) at 06/14/2024 0856 Last data filed at 06/14/2024 0700 Gross per 24 hour  Intake 1045 ml  Output 1277 ml  Net -232 ml   Filed Weights   06/13/24 0855 06/13/24 1216 06/14/24 0359  Weight: 52 kg 51 kg 52.2 kg   Physical Examination: Gen:      No acute distress  chronically HEENT:  EOMI, sclera anicteric, EVD in place Neck:     No masses; no thyromegaly Lungs:    Clear to auscultation bilaterally; normal respiratory effort CV:         Regular rate and rhythm; no murmurs Abd:      PEG tube Ext:    No edema; adequate peripheral perfusion Neuro: Somnolent, arousable   Labs reviewed.  Significant for Sodium 133, BUN/creatinine  48/3.13 Hemoglobin 8.8  Patient Lines/Drains/Airways Status     Active Line/Drains/Airways     Name Placement date Placement time Site Days   Peripheral IV 06/08/24 22 G 1.75 Anterior;Left Forearm 06/08/24  0612  Forearm  1   Fistula / Graft Right Upper arm Arteriovenous fistula 12/08/20  1403  Upper arm  1279   Gastrostomy/Enterostomy PEG-jejunostomy 18 Fr. LUQ 05/21/24  0827  LUQ  19   ICP/Ventriculostomy Ventricular drainage catheter Left Parietal region 05/19/24  1730  Parietal region  21   Fecal Management System 40 mL 06/02/24  1700  -- 7   Wound 05/19/24 1933 Pressure Injury Buttocks Bilateral Stage 1 -  Intact skin with non-blanchable redness of a localized area usually over a bony prominence. 05/19/24  1933  Buttocks  21         Resolved Hospital problems  Hypertensive emergency IV Acute respiratory insufficiency  thrombocytopenia of critical illness Serratia/klebsiella pneumonia Hyperkalemia Acute encephalopathy in the setting of ICH and hydrocephalus Narcotic abuse  Assessment & Plan:  Acute right parietotemporal intraparenchymal hemorrhage with IVH with ICH score 2 due to uncontrolled hypertension Obstructive hydrocephalus status post EVD Seizure disorder Continue EVD at 10 cm water .  Has not tolerated weaning of EVD Plan for VP shunt next week per neurosurgery after allowing spinal fluid to clear RBCs Continue Vimpat  Neuro monitoring  End-stage renal disease on hemodialysis with failed renal transplant but functional pancreatic transplant on immunosuppressive therapy Hypervolemic hyponatremia, hypochloremia, hypercalcemia Continue dialysis per nephrology Antihypertension medication-Norvasc , clonidine , Coreg , hydralazine  on non HD days only Continue cyclosporine , mycophenolate , prednisone   Type 1 diabetes, complicated with vasculopathy and gastroparesis- not on long term insulin  anymore post pancreatic transplant Moderate protein calorie malnutrition Anemia  of chronic disease Monitor blood sugars, continue sliding scale insulin  Monitor CBC  Stage 1 pressure ulcer POA Wound care  Signature:   Lonna Coder MD [facility] See Amion for pager  If no response to pager , please call [phone] until 7pm After 7:00 pm call Elink  [phone] 06/14/2024, 8:56 AM

## 2024-06-15 DIAGNOSIS — G911 Obstructive hydrocephalus: Secondary | ICD-10-CM | POA: Diagnosis not present

## 2024-06-15 DIAGNOSIS — N186 End stage renal disease: Secondary | ICD-10-CM | POA: Diagnosis not present

## 2024-06-15 DIAGNOSIS — Z992 Dependence on renal dialysis: Secondary | ICD-10-CM | POA: Diagnosis not present

## 2024-06-15 DIAGNOSIS — I619 Nontraumatic intracerebral hemorrhage, unspecified: Secondary | ICD-10-CM | POA: Diagnosis not present

## 2024-06-15 LAB — BASIC METABOLIC PANEL WITH GFR
Anion gap: 18 — ABNORMAL HIGH (ref 5–15)
BUN: 80 mg/dL — ABNORMAL HIGH (ref 6–20)
CO2: 27 mmol/L (ref 22–32)
Calcium: 11.9 mg/dL — ABNORMAL HIGH (ref 8.9–10.3)
Chloride: 88 mmol/L — ABNORMAL LOW (ref 98–111)
Creatinine, Ser: 4.51 mg/dL — ABNORMAL HIGH (ref 0.44–1.00)
GFR, Estimated: 12 mL/min — ABNORMAL LOW (ref >60)
Glucose, Bld: 138 mg/dL — ABNORMAL HIGH (ref 70–99)
Potassium: 4.1 mmol/L (ref 3.5–5.1)
Sodium: 133 mmol/L — ABNORMAL LOW (ref 135–145)

## 2024-06-15 LAB — GLUCOSE, CAPILLARY
Glucose-Capillary: 132 mg/dL — ABNORMAL HIGH (ref 70–99)
Glucose-Capillary: 133 mg/dL — ABNORMAL HIGH (ref 70–99)
Glucose-Capillary: 178 mg/dL — ABNORMAL HIGH (ref 70–99)
Glucose-Capillary: 209 mg/dL — ABNORMAL HIGH (ref 70–99)
Glucose-Capillary: 167 mg/dL — ABNORMAL HIGH (ref 70–99)
Glucose-Capillary: 143 mg/dL — ABNORMAL HIGH (ref 70–99)

## 2024-06-15 LAB — VITAMIN D 25 HYDROXY (VIT D DEFICIENCY, FRACTURES): Vit D, 25-Hydroxy: 37.13 ng/mL (ref 30–100)

## 2024-06-15 LAB — CBC
HCT: 26.6 % — ABNORMAL LOW (ref 36.0–46.0)
Hemoglobin: 8.5 g/dL — ABNORMAL LOW (ref 12.0–15.0)
MCH: 31.1 pg (ref 26.0–34.0)
MCHC: 32 g/dL (ref 30.0–36.0)
MCV: 97.4 fL (ref 80.0–100.0)
Platelets: 162 K/uL (ref 150–400)
RBC: 2.73 MIL/uL — ABNORMAL LOW (ref 3.87–5.11)
RDW: 15.7 % — ABNORMAL HIGH (ref 11.5–15.5)
WBC: 3.5 K/uL — ABNORMAL LOW (ref 4.0–10.5)
nRBC: 0 % (ref 0.0–0.2)

## 2024-06-15 MED ORDER — CEFAZOLIN SODIUM-DEXTROSE 2-4 GM/100ML-% IV SOLN
2.0000 g | INTRAVENOUS | Status: AC
  Administered 2024-06-18: 2 g via INTRAVENOUS
  Filled 2024-06-15: qty 100

## 2024-06-15 MED ORDER — SODIUM CHLORIDE 0.9 % IV SOLN
30.0000 mg | Freq: Once | INTRAVENOUS | Status: AC
  Administered 2024-06-15: 30 mg via INTRAVENOUS
  Filled 2024-06-15: qty 10

## 2024-06-15 MED ORDER — CHLORHEXIDINE GLUCONATE CLOTH 2 % EX PADS: 6.0000 | MEDICATED_PAD | Freq: Once | CUTANEOUS | Status: AC

## 2024-06-15 MED ORDER — CHLORHEXIDINE GLUCONATE CLOTH 2 % EX PADS
6.0000 | MEDICATED_PAD | Freq: Once | CUTANEOUS | Status: AC
  Administered 2024-06-16: 6 via TOPICAL

## 2024-06-15 NOTE — Progress Notes (Signed)
 NAME:  [name] [name], MRN:  [id number], DOB:  [birth year], LOS: 27 ADMISSION DATE:  05/19/2024 CONSULTATION DATE:  9/15/20025 REFERRING MD: Patsey - EDP, CHIEF COMPLAINT:  Code Stroke, ICH   History of Present Illness:  43 year old woman who presented to [facility] ED 9/15 as a Code Stroke. 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 Recent admissions 6/11-7/28 and 7/29-8/13 for ICH.  Patient presented to [facility] ED from dialysis as a Code Stroke. CT Head demonstrated large volume IVH with moderate hydrocephalus, acute hemorrhage of medial R temporal lobe and small foci of residual/recurrent hemorrhage in the L cerebellar hemisphere.   Patient was seen by stroke team and neurosurgery, EVD was placed on left side, PCCM was consulted for medical evaluation and admission to neuro ICU  Pertinent Medical History:   Past Medical History:  Diagnosis Date   Anemia    Anxiety    DM (diabetes mellitus), type 1 (HCC)    ESRD on hemodialysis (HCC)    Gastroparesis    History of simultaneous kidney and pancreas transplant (HCC) 2014   Hypertension    Ischemic cerebrovascular accident (CVA) (HCC) 11/2021   Narcotic abuse (HCC)    Non compliance with medical treatment    Stroke [facility])    Vision loss    Significant Hospital Events: Including procedures, antibiotic start and stop dates in addition to other pertinent events   9/15 admitted with ICH plus IVH, EVD was placed for hydrocephalus, intubated 9/16 remain on clevidipine  infusion, EVD is draining bloody fluid 9/17 continue to require clevidipine  infusion, tolerated spontaneous breathing trial 9/19: this am, dialysis ongoing. Neurosx plans to continue ventriculostomy for now. Remains on cleviprex  to maintain BP goal. Tmax 100.1. despite plt decrease 4T score 0. Will maintain heparin  as is for  now and closely follow trend.  9/22 palliative care meeting.  Family is insistent that they want full scope of care 9/23 Extubated 9/27 Spiking low grade fevers, wheezing on exam, CXR normal  9/30 No tolerating when EVD is raised. Still bloody fluid. CT head will be done. 10/1 -developed fevers pancultures drawn and broad-spectrum antibiotics started 10/2 became afebrile after starting antibiotics, did not tolerate raising EVD 10/3 serum potassium 6.5, receiving hemodialysis, no overnight issues.  Did not tolerate weaning EVD 10/4 EVD remain at 10, no issues 10/5 EVD remains at 10, afebrile 10/6 EVD was raised to 30 cm water , tolerating well, remained afebrile 10/7 EVD is at 30 cm water , CT scan will be done tomorrow morning, no overnight issues 10/8 she became somnolent, CT head showed increasing hydrocephalus, EVD was titrated down to 10 cm of water  with improvement in mental status  Interim History / Subjective:   Hypotensive overnight.  Blood pressure medication held  Objective:  Blood pressure 130/62, pulse 87, temperature 98.8 F (37.1 C), temperature source Axillary, resp. rate (!) 23, height 5' 3 (1.6 m), weight 52.4 kg, SpO2 100%.        Intake/Output Summary (Last 24 hours) at 06/15/2024 0841 Last data filed at 06/15/2024 0800 Gross per 24 hour  Intake 1375 ml  Output 116 ml  Net 1259 ml   Filed Weights   06/13/24 1216 06/14/24 0359 06/15/24 0500  Weight: 51 kg 52.2 kg 52.4 kg   Physical Examination: Gen:      No acute distress, chronically ill-appearing HEENT:  EOMI, sclera anicteric Neck:  No masses; no thyromegaly Lungs:    Clear to auscultation bilaterally; normal respiratory effort CV:         Regular rate and rhythm; no murmurs Abd:      + bowel sounds; soft, non-tender; no palpable masses, no distension Ext:    No edema; adequate peripheral perfusion Neuro: Somnolent, nonverbal  Labs reviewed.  Significant for Sodium 133, BUN/creatinine 18/4.51 WBC  3.5, hemoglobin 8.5 No new imaging  Resolved Hospital problems  Hypertensive emergency IV Acute respiratory insufficiency  thrombocytopenia of critical illness Serratia/klebsiella pneumonia Hyperkalemia Acute encephalopathy in the setting of ICH and hydrocephalus Narcotic abuse  Assessment & Plan:  Acute right parietotemporal intraparenchymal hemorrhage with IVH with ICH score 2 due to uncontrolled hypertension Obstructive hydrocephalus status post EVD Seizure disorder Continue EVD at 10 cm water .  Has not tolerated weaning of EVD Plan for VP shunt on 10/13 per neurosurgery Continue Vimpat  Neuro monitoring  End-stage renal disease on hemodialysis with failed renal transplant but functional pancreatic transplant on immunosuppressive therapy Hypervolemic hyponatremia, hypochloremia, hypercalcemia Continue dialysis per nephrology Antihypertension medication-Norvasc , clonidine , Coreg , hydralazine  on non HD days only Continue cyclosporine , mycophenolate , prednisone   Type 1 diabetes, complicated with vasculopathy and gastroparesis- not on long term insulin  anymore post pancreatic transplant Moderate protein calorie malnutrition Anemia of chronic disease Monitor blood sugars, continue sliding scale insulin  Monitor CBC  Stage 1 pressure ulcer POA Wound care  Signature:   Lonna Coder MD [facility] See Amion for pager  If no response to pager , please call [phone] until 7pm After 7:00 pm call Elink  [phone] 06/15/2024, 8:41 AM

## 2024-06-15 NOTE — Consult Note (Signed)
 [name] [name] [birth year]  [id number].    Requesting MD: Dr. 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 Her workup has revealed hydrocephalus and IVH and she is planned for OR tomorrow for VP shunt placement. Surgery has been consulted for assistance with abdominal access.   On exam, patient is resting comfortably in bed. She does not respond to verbal stimulation. No family is at bedside.   She has a CT from June 2025 that shows her intra-abdominal pancreas transplant and right iliac renal transplant.   ROS: Not obtained due to patient factors  Family History  Problem Relation Age of Onset   Hypertension Father     Past Medical History:  Diagnosis Date   Anemia    Anxiety    DM (diabetes mellitus), type 1 (HCC)    ESRD on hemodialysis (HCC)    Gastroparesis    History of simultaneous kidney and pancreas transplant (HCC) 2014   Hypertension    Ischemic cerebrovascular accident (CVA) (HCC) 11/2021   Narcotic abuse (HCC)    Non compliance with medical treatment    Stroke [facility])    Vision loss     Past Surgical History:  Procedure Laterality Date   A/V FISTULAGRAM Right 02/17/2021   Procedure: A/V FISTULAGRAM;  Surgeon: Gretta Lonni PARAS, MD;  Location: [facility] INVASIVE CV LAB;  Service: Cardiovascular;  Laterality: Right;   AV FISTULA PLACEMENT  11/17/2011   Procedure: ARTERIOVENOUS (AV) FISTULA CREATION;  Surgeon: Krystal JULIANNA Doing, MD;  Location: [facility] OR;  Service: Vascular;  Laterality: Right;   BIOPSY  12/03/2021   Procedure: BIOPSY;  Surgeon: Legrand Victory LITTIE DOUGLAS, MD;  Location: [facility] ENDOSCOPY;  Service: Gastroenterology;;   ESOPHAGOGASTRODUODENOSCOPY (EGD) WITH PROPOFOL  N/A 12/03/2021   Procedure: ESOPHAGOGASTRODUODENOSCOPY (EGD) WITH PROPOFOL ;  Surgeon: Legrand Victory LITTIE DOUGLAS, MD;  Location: [facility] ENDOSCOPY;   Service: Gastroenterology;  Laterality: N/A;   EYE SURGERY     HEMATOMA EVACUATION Right 02/25/2021   Procedure: EVACUATION HEMATOMA RIGHT CHEST;  Surgeon: Sheree Penne Lonni, MD;  Location: [facility] OR;  Service: Vascular;  Laterality: Right;   INSERTION OF DIALYSIS CATHETER Right 12/08/2020   Procedure: INSERTION OF TUNNELED DIALYSIS CATHETER;  Surgeon: Gretta Lonni PARAS, MD;  Location: [facility] OR;  Service: Vascular;  Laterality: Right;   IR ANGIO INTRA EXTRACRAN SEL INTERNAL CAROTID BILAT MOD SED  02/27/2024   IR ANGIO VERTEBRAL SEL VERTEBRAL UNI R MOD SED  02/27/2024   IR GASTROSTOMY TUBE MOD SED  03/20/2024   IR GJ TUBE CHANGE  04/15/2024   IR GJ TUBE CHANGE  05/21/2024   IR REMOVAL TUN CV CATH W/O FL  05/03/2021   KIDNEY TRANSPLANT     NEPHRECTOMY TRANSPLANTED ORGAN     pancrease transplant     PERIPHERAL VASCULAR BALLOON ANGIOPLASTY Right 02/17/2021   Procedure: PERIPHERAL VASCULAR BALLOON ANGIOPLASTY;  Surgeon: Gretta Lonni PARAS, MD;  Location: [facility] INVASIVE CV LAB;  Service: Cardiovascular;  Laterality: Right;   REFRACTIVE SURGERY     REVISON OF ARTERIOVENOUS FISTULA Right 12/08/2020   Procedure: RIGHT ARM ARTERIOVENOUS FISTULA REVISION AND RESECTION;  Surgeon: Gretta Lonni PARAS, MD;  Location[facility] OR;  Service: Vascular;  Laterality: Right;   REVISON OF ARTERIOVENOUS FISTULA Right 02/25/2021   Procedure: RIGHT ARM ARTERIOVENOUS FISTULA REVISION WITH TRANSPOSITION OF CEPHALIC VEIN ON AXILLARY VEIN;  Surgeon: Gretta Lonni PARAS, MD;  Location: [facility] OR;  Service: Vascular;  Laterality: Right;    Social History:  reports that she has quit smoking. Her smoking use included pipe. She has never used smokeless tobacco. She reports that she does not drink alcohol  and does not use drugs.  Allergies: No Known Allergies  Medications Prior to Admission  Medication Sig Dispense Refill   albuterol  (PROVENTIL ) (2.5 MG/3ML) 0.083% nebulizer solution Take 3 mLs (2.5 mg total) by nebulization every 4  (four) hours as needed for wheezing or shortness of breath.     amLODipine  (NORVASC ) 10 MG tablet Take 1 tablet (10 mg total) by mouth daily. 30 tablet 2   artificial tears ophthalmic solution Place 1 drop into the left eye as needed for dry eyes.     aspirin  EC 81 MG tablet Chew 81 mg by mouth daily.     busPIRone  (BUSPAR ) 10 MG tablet Take 10 mg by mouth 2 (two) times daily.     cinacalcet  (SENSIPAR ) 30 MG tablet Take 3 tablets (90 mg total) by mouth daily with supper. 60 tablet 1   cloNIDine  (CATAPRES  - DOSED IN MG/24 HR) 0.3 mg/24hr patch Place 1 patch (0.3 mg total) onto the skin once a week.     cycloSPORINE  (SANDIMMUNE ) 25 MG capsule Take 25 mg by mouth in the morning.     cycloSPORINE  modified (NEORAL ) 100 MG capsule Take 2 capsules (200 mg total) by mouth 2 (two) times daily.     Darbepoetin Alfa  (ARANESP ) 60 MCG/0.3ML SOSY injection Inject 0.3 mLs (60 mcg total) into the vein every Friday with hemodialysis. 4.2 mL    doxercalciferol  (HECTOROL ) 4 MCG/2ML injection Inject 3 mLs (6 mcg total) into the vein every Monday, Wednesday, and Friday with hemodialysis. 1 mL    DULoxetine  (CYMBALTA ) 20 MG capsule Take 1 capsule (20 mg total) by mouth daily. 30 capsule 2   ferric citrate  (AURYXIA ) 1 GM 210 MG(Fe) tablet Take 210-630 mg by mouth See admin instructions. Take 3 tablets (630 mg) by mouth three times daily with meals, and take 1 tablet (210 mg) twice daily with snacks     gabapentin  (NEURONTIN ) 100 MG capsule Take 100 mg by mouth 2 (two) times daily.     hydrALAZINE  (APRESOLINE ) 25 MG tablet Take 3 tablets (75 mg total) by mouth every 8 (eight) hours. 270 tablet 2   hydrOXYzine  (VISTARIL ) 25 MG capsule Take 25 mg by mouth 3 (three) times daily as needed for anxiety.     lacosamide  100 MG TABS Take 1 tablet (100 mg total) by mouth 2 (two) times daily.     lidocaine -prilocaine  (EMLA ) cream Apply 1 application  topically daily as needed (prior to port access).     MELATONIN PO Take 1 tablet by  mouth at bedtime.     metoCLOPramide  (REGLAN ) 5 MG tablet Take 5 mg by mouth 2 (two) times daily before a meal.     metoprolol  tartrate (LOPRESSOR ) 50 MG tablet Take 1 tablet (50 mg total) by mouth 2 (two) times daily.     multivitamin (RENA-VIT) TABS tablet Take 1 tablet by mouth at bedtime.     mycophenolate  (CELLCEPT ) 200 mg/mL oral suspension Take 2.5 mLs (500 mg total) by mouth 2 (two) times daily.     Nutritional Supplements (FEEDING SUPPLEMENT, NEPRO CARB STEADY,) LIQD Place 1,000 mLs into feeding tube continuous.     Nystatin (GERHARDT'S BUTT CREAM) CREA Apply 1 Application topically as needed for irritation.  ondansetron  (ZOFRAN ) 4 MG tablet Take 4 mg by mouth every 8 (eight) hours as needed for nausea or vomiting.     oxyCODONE -acetaminophen  (PERCOCET/ROXICET) 5-325 MG tablet Take 1 tablet by mouth 3 (three) times daily.     predniSONE  (DELTASONE ) 5 MG tablet Take 15 mg by mouth daily.  5   sevelamer  carbonate (RENVELA ) 2.4 g PACK Take 2.4 g by mouth 3 (three) times daily with meals.     simethicone  (MYLICON) 40 MG/0.6ML drops Take 0.6 mLs (40 mg total) by mouth 4 (four) times daily as needed for flatulence.     XPHOZAH 30 MG TABS Take 1 tablet by mouth 2 (two) times daily.     zolpidem  (AMBIEN ) 10 MG tablet Take 5 mg by mouth at bedtime as needed for sleep.      Physical Exam: Blood pressure (!) 148/66, pulse 86, temperature 98.8 F (37.1 C), temperature source Oral, resp. rate (!) 21, height 5' 3 (1.6 m), weight 52.4 kg, SpO2 100%. Gen: female, NAD, does not respond to command Abd: soft, non-distended, G-tube in place with TF running, midline laparotomy incision well healed  Results for orders placed or performed during the hospital encounter of 05/19/24 (from the past 48 hours)  Glucose, capillary     Status: Abnormal   Collection Time: 06/13/24 11:34 AM  Result Value Ref Range   Glucose-Capillary 119 (H) 70 - 99 mg/dL    Comment: Glucose reference range applies only to  samples taken after fasting for at least 8 hours.  Glucose, capillary     Status: Abnormal   Collection Time: 06/13/24  3:33 PM  Result Value Ref Range   Glucose-Capillary 213 (H) 70 - 99 mg/dL    Comment: Glucose reference range applies only to samples taken after fasting for at least 8 hours.  Glucose, capillary     Status: Abnormal   Collection Time: 06/13/24  7:50 PM  Result Value Ref Range   Glucose-Capillary 205 (H) 70 - 99 mg/dL    Comment: Glucose reference range applies only to samples taken after fasting for at least 8 hours.  Glucose, capillary     Status: Abnormal   Collection Time: 06/13/24 11:37 PM  Result Value Ref Range   Glucose-Capillary 132 (H) 70 - 99 mg/dL    Comment: Glucose reference range applies only to samples taken after fasting for at least 8 hours.  Glucose, capillary     Status: Abnormal   Collection Time: 06/14/24  3:57 AM  Result Value Ref Range   Glucose-Capillary 140 (H) 70 - 99 mg/dL    Comment: Glucose reference range applies only to samples taken after fasting for at least 8 hours.  CBC     Status: Abnormal   Collection Time: 06/14/24  5:34 AM  Result Value Ref Range   WBC 4.0 4.0 - 10.5 K/uL   RBC 2.81 (L) 3.87 - 5.11 MIL/uL   Hemoglobin 8.8 (L) 12.0 - 15.0 g/dL   HCT 72.7 (L) 63.9 - 53.9 %   MCV 96.8 80.0 - 100.0 fL   MCH 31.3 26.0 - 34.0 pg   MCHC 32.4 30.0 - 36.0 g/dL   RDW 84.5 88.4 - 84.4 %   Platelets 158 150 - 400 K/uL   nRBC 0.0 0.0 - 0.2 %    Comment: Performed at [facility], [address], [place], [name] [address]  Basic metabolic panel with GFR     Status: Abnormal   Collection Time: 06/14/24  5:34  AM  Result Value Ref Range   Sodium 133 (L) 135 - 145 mmol/L   Potassium 3.8 3.5 - 5.1 mmol/L   Chloride 89 (L) 98 - 111 mmol/L   CO2 25 22 - 32 mmol/L   Glucose, Bld 114 (H) 70 - 99 mg/dL    Comment: Glucose reference range applies only to samples taken after fasting for at least 8 hours.   BUN 48 (H) 6 - 20 mg/dL    Creatinine, Ser 6.86 (H) 0.44 - 1.00 mg/dL   Calcium  11.3 (H) 8.9 - 10.3 mg/dL   GFR, Estimated 18 (L) >60 mL/min    Comment: (NOTE) Calculated using the CKD-EPI Creatinine Equation (2021)    Anion gap 19 (H) 5 - 15    Comment: Performed at [facility], [address], [place], [name] [address]  Glucose, capillary     Status: Abnormal   Collection Time: 06/14/24  7:35 AM  Result Value Ref Range   Glucose-Capillary 140 (H) 70 - 99 mg/dL    Comment: Glucose reference range applies only to samples taken after fasting for at least 8 hours.  Glucose, capillary     Status: Abnormal   Collection Time: 06/14/24 11:35 AM  Result Value Ref Range   Glucose-Capillary 150 (H) 70 - 99 mg/dL    Comment: Glucose reference range applies only to samples taken after fasting for at least 8 hours.  Glucose, capillary     Status: Abnormal   Collection Time: 06/14/24  3:40 PM  Result Value Ref Range   Glucose-Capillary 227 (H) 70 - 99 mg/dL    Comment: Glucose reference range applies only to samples taken after fasting for at least 8 hours.  Glucose, capillary     Status: Abnormal   Collection Time: 06/14/24  7:14 PM  Result Value Ref Range   Glucose-Capillary 190 (H) 70 - 99 mg/dL    Comment: Glucose reference range applies only to samples taken after fasting for at least 8 hours.  Glucose, capillary     Status: Abnormal   Collection Time: 06/14/24 11:47 PM  Result Value Ref Range   Glucose-Capillary 124 (H) 70 - 99 mg/dL    Comment: Glucose reference range applies only to samples taken after fasting for at least 8 hours.  Glucose, capillary     Status: Abnormal   Collection Time: 06/15/24  3:14 AM  Result Value Ref Range   Glucose-Capillary 132 (H) 70 - 99 mg/dL    Comment: Glucose reference range applies only to samples taken after fasting for at least 8 hours.  CBC     Status: Abnormal   Collection Time: 06/15/24  3:57 AM  Result Value Ref Range   WBC 3.5 (L) 4.0 - 10.5 K/uL   RBC 2.73  (L) 3.87 - 5.11 MIL/uL   Hemoglobin 8.5 (L) 12.0 - 15.0 g/dL   HCT 73.3 (L) 63.9 - 53.9 %   MCV 97.4 80.0 - 100.0 fL   MCH 31.1 26.0 - 34.0 pg   MCHC 32.0 30.0 - 36.0 g/dL   RDW 84.2 (H) 88.4 - 84.4 %   Platelets 162 150 - 400 K/uL   nRBC 0.0 0.0 - 0.2 %    Comment: Performed at [facility], [address] [place], [name] [address]  Basic metabolic panel with GFR     Status: Abnormal   Collection Time: 06/15/24  3:57 AM  Result Value Ref Range   Sodium 133 (L) 135 - 145  mmol/L   Potassium 4.1 3.5 - 5.1 mmol/L   Chloride 88 (L) 98 - 111 mmol/L   CO2 27 22 - 32 mmol/L   Glucose, Bld 138 (H) 70 - 99 mg/dL    Comment: Glucose reference range applies only to samples taken after fasting for at least 8 hours.   BUN 80 (H) 6 - 20 mg/dL   Creatinine, Ser 5.48 (H) 0.44 - 1.00 mg/dL   Calcium  11.9 (H) 8.9 - 10.3 mg/dL   GFR, Estimated 12 (L) >60 mL/min    Comment: (NOTE) Calculated using the CKD-EPI Creatinine Equation (2021)    Anion gap 18 (H) 5 - 15    Comment: Performed at [facility], [address], [place], [name] [address]   No results found.  Assessment/Plan 43 y/o F with multiple medical problems who presented with a stroke and now has evidence of hydrocephalus  - There was no family present at bedside at the time of my evaluation. She was tentatively scheduled for OR tomorrow, however, given her surgical history and the complexity of gaining access to her abdomen additional planning will be necessary from a general surgery perspective - Would advise canceling tomorrow's planned surgery. We will reach out with the operating surgeon to discuss and coordinate a plan  Cordella DELENA Polly Marlis Cheron Surgery 06/15/2024, 7:44 AM Please see Amion for pager number during day hours 7:00am-4:30pm or 7:00am -11:30am on weekends

## 2024-06-15 NOTE — Progress Notes (Signed)
 Patient ID: [name] [name], female   DOB: Nov 26, 1980, 43 y.o.   MRN: [id number] [facility] Progress Note   Subjective:    Seen in room.  Sleeping, eyes closed.   Objective:   BP (!) 128/54   Pulse (!) 116   Temp 98.6 F (37 C)   Resp (!) 28   Ht 5' 3 (1.6 m)   Wt 45 kg   SpO2 99%   BMI 17.57 kg/m   Physical Exam: Gen: Comfortably resting in bed, eyes closed +Ventriculostomy drain, still with a little blood in it. CVS: Pulse regular tachycardia, S1 and S2 normal Resp: Anteriorly clear to auscultation, no rales/rhonchi Abd: Soft, flat, nontender, bowel sounds normal Ext: No lower extremity edema RUA AVF+bruit    Assessment/ Plan:    OP HD: NW MWF 3h  B400  55.2kg  2K bath   AVF  Heparin  none Mircera 150 mcg q 2 wks, due 9/24  Acute right intracranial hemorrhage: Associated with uncontrolled hypertension and status post ventriculostomy.  Extubated 9/22. Ventriculostomy fluid remains bloody. Plan for VP shunt next week  ESRD/ hx of renal allograft failure: Continue HD on MWF. Next HD 06/16/24. 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 Getting darbe 100 mcg sq weekly.  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  Ca up, suspect immobility, will give a dose of pamidronate today 10/12 Nutrition: Ongoing tube feeds, monitor electrolytes/volume status. Hypertension: BP's were very high, then dropped low. Is now getting norvasc , clonidine  and coreg  on non-hd days only.  Volume: under dry wt 5-7kg. Tolerating usually 1- 2 L UF.  Dispo: spoke with mom- full scope care, full code.  She is hopeful for pt to get another transplant.   [name] Bonine MD 06/15/2024, 2:05 PM  Recent Labs  Lab  06/11/24 0840 06/12/24 0442 06/14/24 0534 06/15/24 0357  HGB 8.4*   < > 8.8* 8.5*  ALBUMIN  2.9*  --   --   --   CALCIUM  11.3*   < > 11.3* 11.9*  PHOS 7.2*  --   --   --   CREATININE 4.67*   < > 3.13* 4.51*  K 4.5   < > 3.8 4.1   < > = values in this interval not displayed.    Inpatient medications:  amLODipine   10 mg Per Tube Once per day on Sunday Tuesday Thursday Saturday   carvedilol   25 mg Per Tube 2 times per day on Sunday Tuesday Thursday Saturday   Chlorhexidine  Gluconate Cloth  6 each Topical Q0600   Chlorhexidine  Gluconate Cloth  6 each Topical Once   And   Chlorhexidine  Gluconate Cloth  6 each Topical Once   cloNIDine   0.3 mg Per Tube 3 times per day on Sunday Tuesday Thursday Saturday   cycloSPORINE   200 mg Per Tube QPM   cycloSPORINE   225 mg Per Tube Daily   darbepoetin (ARANESP ) injection - DIALYSIS  100 mcg Subcutaneous Q Wed-1800   fiber  1 packet Per Tube TID   heparin  injection (subcutaneous)  5,000 Units Subcutaneous Q12H   hydrALAZINE   100 mg Per Tube 3 times per day on Sunday Tuesday Thursday Saturday   insulin  aspart  0-9 Units Subcutaneous Q4H   lacosamide   100 mg Per Tube  BID   multivitamin with minerals  1 tablet Per Tube Daily   mycophenolate   500 mg Per Tube BID   mouth rinse  15 mL Mouth Rinse 4 times per day   pantoprazole  (PROTONIX ) IV  40 mg Intravenous QHS   predniSONE   15 mg Per Tube Q breakfast    anticoagulant sodium citrate      anticoagulant sodium citrate      [START ON 06/18/2024]  ceFAZolin  (ANCEF ) IV     feeding supplement (NEPRO CARB STEADY) 55 mL/hr at 06/15/24 1200   acetaminophen  **OR** acetaminophen  (TYLENOL ) oral liquid 160 mg/5 mL **OR** acetaminophen , alteplase , anticoagulant sodium citrate , anticoagulant sodium citrate , feeding supplement (NEPRO CARB STEADY), heparin , heparin , labetalol , lidocaine  (PF), lidocaine  (PF), lidocaine -prilocaine , lidocaine -prilocaine , midodrine , mouth rinse, pentafluoroprop-tetrafluoroeth,  pentafluoroprop-tetrafluoroeth, sennosides

## 2024-06-16 ENCOUNTER — Encounter (HOSPITAL_COMMUNITY)
Admission: EM | Disposition: A | Discharge status: Home Health | Payer: Self-pay | Source: Ambulatory Visit | Attending: Internal Medicine

## 2024-06-16 DIAGNOSIS — G911 Obstructive hydrocephalus: Secondary | ICD-10-CM | POA: Diagnosis not present

## 2024-06-16 DIAGNOSIS — I619 Nontraumatic intracerebral hemorrhage, unspecified: Secondary | ICD-10-CM | POA: Diagnosis not present

## 2024-06-16 DIAGNOSIS — N186 End stage renal disease: Secondary | ICD-10-CM | POA: Diagnosis not present

## 2024-06-16 DIAGNOSIS — Z992 Dependence on renal dialysis: Secondary | ICD-10-CM | POA: Diagnosis not present

## 2024-06-16 LAB — CALCITRIOL (1,25 DI-OH VIT D): Vit D, 1,25-Dihydroxy: 8 pg/mL — ABNORMAL LOW (ref 24.8–81.5)

## 2024-06-16 LAB — CBC
HCT: 28.5 % — ABNORMAL LOW (ref 36.0–46.0)
Hemoglobin: 9.1 g/dL — ABNORMAL LOW (ref 12.0–15.0)
MCH: 31 pg (ref 26.0–34.0)
MCHC: 31.9 g/dL (ref 30.0–36.0)
MCV: 96.9 fL (ref 80.0–100.0)
Platelets: 179 K/uL (ref 150–400)
RBC: 2.94 MIL/uL — ABNORMAL LOW (ref 3.87–5.11)
RDW: 15.7 % — ABNORMAL HIGH (ref 11.5–15.5)
WBC: 3.8 K/uL — ABNORMAL LOW (ref 4.0–10.5)
nRBC: 0 % (ref 0.0–0.2)

## 2024-06-16 LAB — BASIC METABOLIC PANEL WITH GFR
Anion gap: 20 — ABNORMAL HIGH (ref 5–15)
BUN: 118 mg/dL — ABNORMAL HIGH (ref 6–20)
CO2: 24 mmol/L (ref 22–32)
Calcium: 12.1 mg/dL — ABNORMAL HIGH (ref 8.9–10.3)
Chloride: 89 mmol/L — ABNORMAL LOW (ref 98–111)
Creatinine, Ser: 5.8 mg/dL — ABNORMAL HIGH (ref 0.44–1.00)
GFR, Estimated: 9 mL/min — ABNORMAL LOW (ref >60)
Glucose, Bld: 132 mg/dL — ABNORMAL HIGH (ref 70–99)
Potassium: 4.8 mmol/L (ref 3.5–5.1)
Sodium: 133 mmol/L — ABNORMAL LOW (ref 135–145)

## 2024-06-16 LAB — GLUCOSE, CAPILLARY
Glucose-Capillary: 149 mg/dL — ABNORMAL HIGH (ref 70–99)
Glucose-Capillary: 131 mg/dL — ABNORMAL HIGH (ref 70–99)
Glucose-Capillary: 158 mg/dL — ABNORMAL HIGH (ref 70–99)
Glucose-Capillary: 256 mg/dL — ABNORMAL HIGH (ref 70–99)
Glucose-Capillary: 134 mg/dL — ABNORMAL HIGH (ref 70–99)
Glucose-Capillary: 99 mg/dL (ref 70–99)

## 2024-06-16 LAB — PROTIME-INR
INR: 1.1 (ref 0.8–1.2)
Prothrombin Time: 14.8 s (ref 11.4–15.2)

## 2024-06-16 LAB — PARATHYROID HORMONE, INTACT (NO CA): PTH: 124 pg/mL — ABNORMAL HIGH (ref 15–65)

## 2024-06-16 SURGERY — SHUNT INSERTION VENTRICULAR-PERITONEAL
Anesthesia: General

## 2024-06-16 NOTE — Progress Notes (Signed)
 Subjective: The patient is alert.  She is in no apparent distress.  Her mother is at the bedside.  Objective: Vital signs in last 24 hours: Temp:  [97.1 F (36.2 C)-98.6 F (37 C)] 97.7 F (36.5 C) (10/13 0400) Pulse Rate:  [78-94] 81 (10/13 0700) Resp:  [20-28] 24 (10/13 0700) BP: (108-148)/(52-74) 141/59 (10/13 0700) SpO2:  [98 %-100 %] 100 % (10/13 0700) Weight:  [53.3 kg] 53.3 kg (10/13 0322) Estimated body mass index is 20.82 kg/m as calculated from the following:   Height as of this encounter: 5' 3 (1.6 m).   Weight as of this encounter: 53.3 kg.   Intake/Output from previous day: 10/12 0701 - 10/13 0700 In: 1880.5 [NG/GT:1320; IV Piggyback:560.5] Out: 177 [Drains:127; Stool:50] Intake/Output this shift: No intake/output data recorded.  Physical exam the patient opens her eyes to voice and is attentive.  She will occasionally utter some words.  Lab Results: Recent Labs    06/15/24 0357 06/16/24 0555  WBC 3.5* 3.8*  HGB 8.5* 9.1*  HCT 26.6* 28.5*  PLT 162 179   BMET Recent Labs    06/15/24 0357 06/16/24 0555  NA 133* 133*  K 4.1 4.8  CL 88* 89*  CO2 27 24  GLUCOSE 138* 132*  BUN 80* 118*  CREATININE 4.51* 5.80*  CALCIUM  11.9* 12.1*    Studies/Results: No results found.  Assessment/Plan: Intracerebral hemorrhage, intraventricular hemorrhage, hydrocephalus, ventriculostomy: I am coordinating with general surgery for placement of a ventriculoperitoneal shunt.  Because of her complex abdominal issues we have had to cancel her surgery planned for today.  Laparoscopic assistance does not seem possible.  Dr. Vernetta and I are planning to proceed with placement of an open abdominal incision ventriculoperitoneal shunt on Wednesday.  I discussed all this with the patient's mother.  I have told her that if it is not possible to place the distal catheter into peritoneum we will need to place a ventriculoatrial or ventriculopleural shunt.  We discussed the risk.   I have answered all her questions.  She agrees with this plan.  LOS: 28 days     [name] [name] 06/16/2024, 7:52 AM     Patient ID: [name] [name], female   DOB: 1981/03/05, 43 y.o.   MRN: [id number]

## 2024-06-16 NOTE — Progress Notes (Signed)
 NAME:  Shamere Dilworth, MRN:  [id number], DOB:  1981-07-05, LOS: 28 ADMISSION DATE:  05/19/2024 CONSULTATION DATE:  9/15/20025 REFERRING MD: Patsey - EDP, CHIEF COMPLAINT:  Code Stroke, ICH   History of Present Illness:  43 year old woman who presented to [facility] ED 9/15 as a Code Stroke. 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 Recent admissions 6/11-7/28 and 7/29-8/13 for ICH.  Patient presented to [facility] ED from dialysis as a Code Stroke. CT Head demonstrated large volume IVH with moderate hydrocephalus, acute hemorrhage of medial R temporal lobe and small foci of residual/recurrent hemorrhage in the L cerebellar hemisphere.   Patient was seen by stroke team and neurosurgery, EVD was placed on left side, PCCM was consulted for medical evaluation and admission to neuro ICU  Pertinent Medical History:   Past Medical History:  Diagnosis Date   Anemia    Anxiety    DM (diabetes mellitus), type 1 (HCC)    ESRD on hemodialysis (HCC)    Gastroparesis    History of simultaneous kidney and pancreas transplant (HCC) 2014   Hypertension    Ischemic cerebrovascular accident (CVA) (HCC) 11/2021   Narcotic abuse (HCC)    Non compliance with medical treatment    Stroke [facility])    Vision loss    Significant Hospital Events: Including procedures, antibiotic start and stop dates in addition to other pertinent events   9/15 admitted with ICH plus IVH, EVD was placed for hydrocephalus, intubated 9/16 remain on clevidipine  infusion, EVD is draining bloody fluid 9/17 continue to require clevidipine  infusion, tolerated spontaneous breathing trial 9/19: this am, dialysis ongoing. Neurosx plans to continue ventriculostomy for now. Remains on cleviprex  to maintain BP goal. Tmax 100.1. despite plt decrease 4T score 0. Will maintain heparin  as is for  now and closely follow trend.  9/22 palliative care meeting.  Family is insistent that they want full scope of care 9/23 Extubated 9/27 Spiking low grade fevers, wheezing on exam, CXR normal  9/30 No tolerating when EVD is raised. Still bloody fluid. CT head will be done. 10/1 -developed fevers pancultures drawn and broad-spectrum antibiotics started 10/2 became afebrile after starting antibiotics, did not tolerate raising EVD 10/3 serum potassium 6.5, receiving hemodialysis, no overnight issues.  Did not tolerate weaning EVD 10/4 EVD remain at 10, no issues 10/5 EVD remains at 10, afebrile 10/6 EVD was raised to 30 cm water , tolerating well, remained afebrile 10/7 EVD is at 30 cm water , CT scan will be done tomorrow morning, no overnight issues 10/8 she became somnolent, CT head showed increasing hydrocephalus, EVD was titrated down to 10 cm of water  with improvement in mental status 10/13 VP shunt placement scheduled for today postponed to Wednesday  Interim History / Subjective:   No acute events overnight. VP shunt placement scheduled for today postponed to Wednesday  Objective:  Blood pressure (!) 141/59, pulse 81, temperature 98.3 F (36.8 C), temperature source Axillary, resp. rate (!) 24, height 5' 3 (1.6 m), weight 53.3 kg, SpO2 100%.        Intake/Output Summary (Last 24 hours) at 06/16/2024 0922 Last data filed at 06/16/2024 0700 Gross per 24 hour  Intake 1825.48 ml  Output 173 ml  Net 1652.48 ml   Filed Weights   06/14/24 0359 06/15/24 0500 06/16/24 0322  Weight: 52.2 kg 52.4 kg 53.3 kg   Physical Examination: Gen:  No acute distress, chronically ill-appearing HEENT:  EOMI, sclera anicteric Neck:     No masses; no thyromegaly Lungs:    Clear to auscultation bilaterally; normal respiratory effort CV:         Regular rate and rhythm; no murmurs Abd:      + bowel sounds; soft, non-tender; no palpable masses, no distension Ext:    No edema; adequate peripheral  perfusion Neuro: Somnolent, nonverbal  Labs reviewed.  Significant for Sodium 133, BUN/creatinine 18/4.51 WBC 3.5, hemoglobin 8.5 No new imaging  Resolved Hospital problems  Hypertensive emergency IV Acute respiratory insufficiency  thrombocytopenia of critical illness Serratia/klebsiella pneumonia Hyperkalemia Acute encephalopathy in the setting of ICH and hydrocephalus Narcotic abuse  Assessment & Plan:  Acute right parietotemporal intraparenchymal hemorrhage with IVH with ICH score 2 due to uncontrolled hypertension Obstructive hydrocephalus status post EVD Seizure disorder Continue EVD at 10 cm water .  Has not tolerated weaning of EVD VP shunt placement on 10/15 Continue Vimpat  Neuro monitoring  End-stage renal disease on hemodialysis with failed renal transplant but functional pancreatic transplant on immunosuppressive therapy Hypervolemic hyponatremia, hypochloremia, hypercalcemia Continue dialysis per nephrology Antihypertension medication-Norvasc , clonidine , Coreg , hydralazine  on non HD days only Continue cyclosporine , mycophenolate , prednisone   Type 1 diabetes, complicated with vasculopathy and gastroparesis- not on long term insulin  anymore post pancreatic transplant Moderate protein calorie malnutrition Anemia of chronic disease Monitor blood sugars, continue sliding scale insulin  Monitor CBC  Stage 1 pressure ulcer POA Wound care  Signature:   Lonna Coder MD [facility] See Amion for pager  If no response to pager , please call [phone] until 7pm After 7:00 pm call Elink  [phone] 06/16/2024, 9:22 AM

## 2024-06-16 NOTE — Progress Notes (Signed)
 Patient ID: [name] [name], female   DOB: 1981-08-18, 43 y.o.   MRN: [id number] [name] Progress Note   Subjective:    Seen in room.  Sleeping.    Objective:   BP (!) 128/54   Pulse (!) 116   Temp 98.6 F (37 C)   Resp (!) 28   Ht 5' 3 (1.6 m)   Wt 45 kg   SpO2 99%   BMI 17.57 kg/m   Physical Exam: Gen: Comfortably resting in bed, eyes closed +ventriculostomy drain CVS: Pulse regular tachycardia, S1 and S2 normal Resp: Anteriorly clear to auscultation, no rales/rhonchi Abd: Soft, flat, nontender, bowel sounds normal Ext: No lower extremity edema RUA AVF+bruit   OP HD: NW MWF 3h  B400  55.2kg  2K bath   AVF  Heparin  none Mircera 150 mcg q 2 wks, due 9/24   Assessment/ Plan Acute right intracranial hemorrhage: Associated with uncontrolled hypertension and status post ventriculostomy.  Extubated 9/22. Ventriculostomy fluid remains bloody. Plan for VP shunt next week  ESRD/ hx of renal allograft failure: Continue HD on MWF. Next HD 06/16/24.  H/o transplant: pt also has prior history of combined kidney/pancreas transplant with a functioning pancreatic allograft and remains on immunosuppression with cyclosporine , MMF and prednisone . Nutrition: due to ^K+, tube feeds changed to low K+ (Nepro) Anemia of esrd: Hb 8-9 range with ongoing overt mild loss from ventriculostomy. Getting darbe 100 mcg sq weekly.  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  Ca up, suspect immobility --> gave 1 dose of pamidronate 10/12 Nutrition: Ongoing tube feeds, monitor electrolytes/volume status. Hypertension: BP's were very high, then dropped low. Is now getting norvasc , clonidine  and coreg  on non-hd days only.  Volume: under dry wt 5-7kg. Tolerating usually 1- 2 L UF.  Dispo: per pt's mother, pt is to be full scope care, full code.  She is hopeful for pt to get another transplant.  Myer Fret  MD  [facility] 06/16/2024, 10:56 AM  Recent Labs  Lab 06/11/24 0840 06/12/24 0442  06/15/24 0357 06/16/24 0555  HGB 8.4*   < > 8.5* 9.1*  ALBUMIN  2.9*  --   --   --   CALCIUM  11.3*   < > 11.9* 12.1*  PHOS 7.2*  --   --   --   CREATININE 4.67*   < > 4.51* 5.80*  K 4.5   < > 4.1 4.8   < > = values in this interval not displayed.    Inpatient medications:  amLODipine   10 mg Per Tube Once per day on Sunday Tuesday Thursday Saturday   carvedilol   25 mg Per Tube 2 times per day on Sunday Tuesday Thursday Saturday   Chlorhexidine  Gluconate Cloth  6 each Topical Q0600   Chlorhexidine  Gluconate Cloth  6 each Topical Once   And   Chlorhexidine  Gluconate Cloth  6 each Topical Once   cloNIDine   0.3 mg Per Tube 3 times per day on Sunday Tuesday Thursday Saturday   cycloSPORINE   200 mg Per Tube QPM   cycloSPORINE   225 mg Per Tube Daily   darbepoetin (ARANESP ) injection - DIALYSIS  100 mcg Subcutaneous Q Wed-1800   fiber  1 packet Per Tube TID   heparin  injection (subcutaneous)  5,000 Units Subcutaneous Q12H   hydrALAZINE   100 mg Per Tube 3 times per day on Sunday Tuesday Thursday Saturday   insulin  aspart  0-9 Units Subcutaneous Q4H   lacosamide   100 mg Per Tube BID  multivitamin with minerals  1 tablet Per Tube Daily   mycophenolate   500 mg Per Tube BID   mouth rinse  15 mL Mouth Rinse 4 times per day   pantoprazole  (PROTONIX ) IV  40 mg Intravenous QHS   predniSONE   15 mg Per Tube Q breakfast    anticoagulant sodium citrate      anticoagulant sodium citrate      [START ON 06/18/2024]  ceFAZolin  (ANCEF ) IV     feeding supplement (NEPRO CARB STEADY) 55 mL/hr at 06/16/24 0700   acetaminophen  **OR** acetaminophen  (TYLENOL ) oral liquid 160 mg/5 mL **OR** acetaminophen , alteplase , anticoagulant sodium citrate , anticoagulant sodium citrate , feeding supplement (NEPRO CARB STEADY), heparin , heparin , labetalol , lidocaine  (PF), lidocaine  (PF), lidocaine -prilocaine , lidocaine -prilocaine , midodrine , mouth rinse, pentafluoroprop-tetrafluoroeth, pentafluoroprop-tetrafluoroeth,  sennosides

## 2024-06-17 DIAGNOSIS — N186 End stage renal disease: Secondary | ICD-10-CM | POA: Diagnosis not present

## 2024-06-17 DIAGNOSIS — G911 Obstructive hydrocephalus: Secondary | ICD-10-CM | POA: Diagnosis not present

## 2024-06-17 DIAGNOSIS — I619 Nontraumatic intracerebral hemorrhage, unspecified: Secondary | ICD-10-CM | POA: Diagnosis not present

## 2024-06-17 DIAGNOSIS — Z992 Dependence on renal dialysis: Secondary | ICD-10-CM | POA: Diagnosis not present

## 2024-06-17 LAB — GLUCOSE, CAPILLARY
Glucose-Capillary: 143 mg/dL — ABNORMAL HIGH (ref 70–99)
Glucose-Capillary: 136 mg/dL — ABNORMAL HIGH (ref 70–99)
Glucose-Capillary: 143 mg/dL — ABNORMAL HIGH (ref 70–99)
Glucose-Capillary: 249 mg/dL — ABNORMAL HIGH (ref 70–99)
Glucose-Capillary: 144 mg/dL — ABNORMAL HIGH (ref 70–99)
Glucose-Capillary: 88 mg/dL (ref 70–99)

## 2024-06-17 LAB — BASIC METABOLIC PANEL WITH GFR
Anion gap: 21 — ABNORMAL HIGH (ref 5–15)
BUN: 145 mg/dL — ABNORMAL HIGH (ref 6–20)
CO2: 24 mmol/L (ref 22–32)
Calcium: 11.9 mg/dL — ABNORMAL HIGH (ref 8.9–10.3)
Chloride: 88 mmol/L — ABNORMAL LOW (ref 98–111)
Creatinine, Ser: 6.99 mg/dL — ABNORMAL HIGH (ref 0.44–1.00)
GFR, Estimated: 7 mL/min — ABNORMAL LOW (ref >60)
Glucose, Bld: 147 mg/dL — ABNORMAL HIGH (ref 70–99)
Potassium: 5.2 mmol/L — ABNORMAL HIGH (ref 3.5–5.1)
Sodium: 133 mmol/L — ABNORMAL LOW (ref 135–145)

## 2024-06-17 LAB — CBC
HCT: 28 % — ABNORMAL LOW (ref 36.0–46.0)
Hemoglobin: 9.3 g/dL — ABNORMAL LOW (ref 12.0–15.0)
MCH: 31.7 pg (ref 26.0–34.0)
MCHC: 33.2 g/dL (ref 30.0–36.0)
MCV: 95.6 fL (ref 80.0–100.0)
Platelets: 164 K/uL (ref 150–400)
RBC: 2.93 MIL/uL — ABNORMAL LOW (ref 3.87–5.11)
RDW: 15.4 % (ref 11.5–15.5)
WBC: 4.2 K/uL (ref 4.0–10.5)
nRBC: 0 % (ref 0.0–0.2)

## 2024-06-17 NOTE — Progress Notes (Signed)
 Patient ID: [name] [name], female   DOB: April 15, 1981, 43 y.o.   MRN: [id number] [facility] Progress Note   Subjective:    Seen in room.  Gen surgery is consulting to see if the abdomen will be  A good receptor for the VP shunt (given her hx of abd surgeries).     Objective:   BP (!) 128/54   Pulse (!) 116   Temp 98.6 F (37 C)   Resp (!) 28   Ht 5' 3 (1.6 m)   Wt 45 kg   SpO2 99%   BMI 17.57 kg/m   Physical Exam: Gen: Comfortably resting in bed, eyes closed +ventriculostomy drain CVS: Pulse regular tachycardia, S1 and S2 normal Resp: Anteriorly clear to auscultation, no rales/rhonchi Abd: Soft, flat, nontender, bowel sounds normal Ext: No lower extremity edema RUA AVF+bruit   OP HD: NW MWF 3h  B400  55.2kg  2K bath   AVF  Heparin  none Mircera 150 mcg q 2 wks, due 9/24   Assessment/ Plan Acute right intracranial hemorrhage: Associated with uncontrolled hypertension and status post ventriculostomy.  Extubated 9/22. Ventriculostomy fluid remains bloody. Plan for VP shunt soon possibly.  ESRD/ hx of renal allograft failure: usual HD MWF. Off schedule this week, will cont TTS for the rest of the week.  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 Nutrition: due to [place] her tube feeds were changed to Nepro Anemia of esrd: Hb 8-9 range with ongoing overt mild loss from ventriculostomy. Getting darbe 100 mcg sq weekly.  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  Ca up, suspect immobility --> gave 1 dose of pamidronate 10/12 Hypertension: BP's were very high, then dropped low. Is now getting norvasc , clonidine  and coreg  on non-hd days only. BP's are sig better.  Volume: under dry wt 5-7kg. UF's have been ~ 1.5- 2.5 L.  Dispo: per pt's mother, pt is to be full scope care, full code.    Myer Fret  MD  [facility] 06/17/2024, 4:11  PM  Recent Labs  Lab 06/11/24 0840 06/12/24 0442 06/16/24 0555 06/17/24 0327  HGB 8.4*   < > 9.1* 9.3*  ALBUMIN  2.9*  --   --   --   CALCIUM  11.3*   < > 12.1* 11.9*  PHOS 7.2*  --   --   --   CREATININE 4.67*   < > 5.80* 6.99*  K 4.5   < > 4.8 5.2*   < > = values in this interval not displayed.    Inpatient medications:  amLODipine   10 mg Per Tube Once per day on Sunday Tuesday Thursday Saturday   carvedilol   25 mg Per Tube 2 times per day on Sunday Tuesday Thursday Saturday   Chlorhexidine  Gluconate Cloth  6 each Topical Q0600   cloNIDine   0.3 mg Per Tube 3 times per day on Sunday Tuesday Thursday Saturday   cycloSPORINE   200 mg Per Tube QPM   cycloSPORINE   225 mg Per Tube Daily   darbepoetin (ARANESP ) injection - DIALYSIS  100 mcg Subcutaneous Q Wed-1800   fiber  1 packet Per Tube TID   heparin  injection (subcutaneous)  5,000 Units Subcutaneous Q12H   hydrALAZINE   100 mg Per Tube 3 times per day on Sunday Tuesday Thursday Saturday   insulin  aspart  0-9 Units Subcutaneous Q4H   lacosamide   100 mg Per Tube BID  multivitamin with minerals  1 tablet Per Tube Daily   mycophenolate   500 mg Per Tube BID   mouth rinse  15 mL Mouth Rinse 4 times per day   pantoprazole  (PROTONIX ) IV  40 mg Intravenous QHS   predniSONE   15 mg Per Tube Q breakfast    anticoagulant sodium citrate      anticoagulant sodium citrate      [START ON 06/18/2024]  ceFAZolin  (ANCEF ) IV     feeding supplement (NEPRO CARB STEADY) 55 mL/hr at 06/17/24 0400   acetaminophen  **OR** acetaminophen  (TYLENOL ) oral liquid 160 mg/5 mL **OR** acetaminophen , alteplase , anticoagulant sodium citrate , anticoagulant sodium citrate , feeding supplement (NEPRO CARB STEADY), heparin , heparin , labetalol , lidocaine  (PF), lidocaine  (PF), lidocaine -prilocaine , lidocaine -prilocaine , midodrine , mouth rinse, pentafluoroprop-tetrafluoroeth, pentafluoroprop-tetrafluoroeth, sennosides

## 2024-06-17 NOTE — Progress Notes (Signed)
 Subjective: The patient is alert.  Her mother is at the bedside.  Objective: Vital signs in last 24 hours: Temp:  [97.8 F (36.6 C)-98.6 F (37 C)] 98.5 F (36.9 C) (10/14 0400) Pulse Rate:  [76-89] 82 (10/14 0600) Resp:  [14-30] 25 (10/14 0600) BP: (119-169)/(53-78) 156/72 (10/14 0600) SpO2:  [98 %-100 %] 99 % (10/14 0600) Estimated body mass index is 20.82 kg/m as calculated from the following:   Height as of this encounter: 5' 3 (1.6 m).   Weight as of this encounter: 53.3 kg.   Intake/Output from previous day: 10/13 0701 - 10/14 0700 In: 1155 [NG/GT:1155] Out: 80 [Drains:80] Intake/Output this shift: No intake/output data recorded.  Physical exam the patient is alert.  She does not follow commands.  Lab Results: Recent Labs    06/16/24 0555 06/17/24 0327  WBC 3.8* 4.2  HGB 9.1* 9.3*  HCT 28.5* 28.0*  PLT 179 164   BMET Recent Labs    06/16/24 0555 06/17/24 0327  NA 133* 133*  K 4.8 5.2*  CL 89* 88*  CO2 24 24  GLUCOSE 132* 147*  BUN 118* 145*  CREATININE 5.80* 6.99*  CALCIUM  12.1* 11.9*    Studies/Results: No results found.  Assessment/Plan: Hydrocephalus: The plan is to place a ventriculoperitoneal shunt on Wednesday.  Dr. Vernetta is going to do the abdominal exposure.  LOS: 29 days     [name] 06/17/2024, 7:37 AM     Patient ID: [name] [name], female   DOB: Jan 30, 1981, 43 y.o.   MRN: [id number]

## 2024-06-17 NOTE — Progress Notes (Signed)
 Patient ID: [name] [name], female   DOB: [birth year], 43 y.o.   MRN: [id number]   This is a complex patient with hydrocephalus who we have been asked to perform abdominal exposure on during a ventriculoperitoneal shunt placement.  She has a history of having had a combined pancreas and kidney transplant through a midline incision back in around 2014.  She also recently has had a gastrostomy tube inserted for tube feeds by interventional radiology.  On review of her noncontrasted CT scan, she also has splenomegaly and a large liver.  I explained to the patient's mother that this would be a good the a very difficult procedure from an abdominal standpoint.  It is uncertain of how much adhesions she has from her prior surgery.  We would attempt a diagnostic laparoscopy at the upper extent of her incision but this could easily turn into the need for an exploratory laparotomy.  I discussed the risks of the procedure which include but are not limited to bleeding, infection, injury to the bowel or transplant, the need for bowel resection, the need for other procedures, cardiopulmonary issues, blood clots, excetra.  The patient's mother understands and agrees with the procedure which is scheduled for tomorrow

## 2024-06-17 NOTE — Progress Notes (Signed)
 NAME:  [name] [name], MRN:  [id number], DOB:  1981/08/13, LOS: 29 ADMISSION DATE:  05/19/2024 CONSULTATION DATE:  9/15/20025 REFERRING MD: Patsey - EDP, CHIEF COMPLAINT:  Code Stroke, ICH   History of Present Illness:  43 year old woman who presented to [facility] ED 9/15 as a Code Stroke. 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 Recent admissions 6/11-7/28 and 7/29-8/13 for ICH.  Patient [facility] ED from dialysis as a Code Stroke. CT Head demonstrated large volume IVH with moderate hydrocephalus, acute hemorrhage of medial R temporal lobe and small foci of residual/recurrent hemorrhage in the L cerebellar hemisphere.   Patient was seen by stroke team and neurosurgery, EVD was placed on left side, PCCM was consulted for medical evaluation and admission to neuro ICU  Pertinent Medical History:   Past Medical History:  Diagnosis Date   Anemia    Anxiety    DM (diabetes mellitus), type 1 (HCC)    ESRD on hemodialysis (HCC)    Gastroparesis    History of simultaneous kidney and pancreas transplant (HCC) 2014   Hypertension    Ischemic cerebrovascular accident (CVA) (HCC) 11/2021   Narcotic abuse (HCC)    Non compliance with medical treatment    Stroke [facility])    Vision loss    Significant Hospital Events: Including procedures, antibiotic start and stop dates in addition to other pertinent events   9/15 admitted with ICH plus IVH, EVD was placed for hydrocephalus, intubated 9/16 remain on clevidipine  infusion, EVD is draining bloody fluid 9/17 continue to require clevidipine  infusion, tolerated spontaneous breathing trial 9/19: this am, dialysis ongoing. Neurosx plans to continue ventriculostomy for now. Remains on cleviprex  to maintain BP goal. Tmax 100.1. despite plt decrease 4T score 0. Will maintain heparin  as is for  now and closely follow trend.  9/22 palliative care meeting.  Family is insistent that they want full scope of care 9/23 Extubated 9/27 Spiking low grade fevers, wheezing on exam, CXR normal  9/30 No tolerating when EVD is raised. Still bloody fluid. CT head will be done. 10/1 -developed fevers pancultures drawn and broad-spectrum antibiotics started 10/2 became afebrile after starting antibiotics, did not tolerate raising EVD 10/3 serum potassium 6.5, receiving hemodialysis, no overnight issues.  Did not tolerate weaning EVD 10/4 EVD remain at 10, no issues 10/5 EVD remains at 10, afebrile 10/6 EVD was raised to 30 cm water , tolerating well, remained afebrile 10/7 EVD is at 30 cm water , CT scan will be done tomorrow morning, no overnight issues 10/8 she became somnolent, CT head showed increasing hydrocephalus, EVD was titrated down to 10 cm of water  with improvement in mental status 10/13 VP shunt placement scheduled for today postponed to Wednesday  Interim History / Subjective:  No acute events overnight. Plan for VP shunt placement on Wednesday.   Objective:  Blood pressure (!) 150/76, pulse 86, temperature 98.3 F (36.8 C), temperature source Axillary, resp. rate (!) 25, height 5' 3 (1.6 m), weight 55.6 kg, SpO2 100%.        Intake/Output Summary (Last 24 hours) at 06/17/2024 0947 Last data filed at 06/17/2024 0700 Gross per 24 hour  Intake 1045 ml  Output 60 ml  Net 985 ml   Filed Weights   06/15/24 0500 06/16/24 0322 06/17/24 0800  Weight: 52.4 kg 53.3 kg 55.6 kg   Physical Examination: Gen:  No acute distress, chronically ill-appearing HEENT:  EOMI, sclera anicteric Neck:     No masses; no thyromegaly Lungs:    Clear to auscultation bilaterally; normal respiratory effort CV:         Regular rate and rhythm; no murmurs Abd:      + bowel sounds; soft, non-tender; no palpable masses, no distension Ext:    No edema; adequate peripheral perfusion Neuro: Somnolent,  nonverbal  Labs reviewed.  Significant for Sodium 133, BUN/creatinine 18/4.51 WBC 3.5, hemoglobin 8.5 No new imaging  Resolved Hospital problems  Hypertensive emergency IV Acute respiratory insufficiency Thrombocytopenia of critical illness Serratia/klebsiella pneumonia Hyperkalemia Acute encephalopathy in the setting of ICH and hydrocephalus Narcotic abuse  Assessment & Plan:  Acute right parietotemporal intraparenchymal hemorrhage with IVH with ICH score 2 due to uncontrolled hypertension Obstructive hydrocephalus status post EVD Seizure disorder Has not tolerated EVD wean.  - Continue EVD at 10 cm water  - Planning for VP shunt placement on 10/15 - Continue Vimpat  - Continue neuro monitoring  End-stage renal disease on hemodialysis with failed renal transplant but functional pancreatic transplant on immunosuppressive therapy Hypervolemic hyponatremia, hypochloremia, hypercalcemia - Continue dialysis per nephrology - Continue cyclosporine , mycophenolate , prednisone   HTN - Continue Norvasc , clonidine , Coreg , hydralazine  on non HD days only  Anemia of chronic disease No active signs of bleeding. Hgb stable - Continue to monitor CBC  Type 1 diabetes, complicated with vasculopathy and gastroparesis Not on long term insulin  anymore post pancreatic transplant. - Monitor blood sugars, continue sliding scale insulin  - Continue cyclosporine , mycophenolate , prednisone   Moderate protein calorie malnutrition - Cortrak in place, continue tube feeds  Stage 1 pressure ulcer POA - Continue wound care  Signature:   Rexene LOISE Blush, PA-C [facility] 06/17/24 9:47 AM  Please see [email or web address] for pager details.  From 7A-7P if no response, please call [phone] After hours, please call ELink [phone]

## 2024-06-18 ENCOUNTER — Encounter (HOSPITAL_COMMUNITY): Payer: Self-pay | Admitting: Internal Medicine

## 2024-06-18 ENCOUNTER — Inpatient Hospital Stay (HOSPITAL_COMMUNITY): Admitting: Certified Registered"

## 2024-06-18 ENCOUNTER — Encounter (HOSPITAL_COMMUNITY)
Admission: EM | Disposition: A | Discharge status: Home Health | Payer: Self-pay | Source: Ambulatory Visit | Attending: Internal Medicine

## 2024-06-18 DIAGNOSIS — Z992 Dependence on renal dialysis: Secondary | ICD-10-CM | POA: Diagnosis not present

## 2024-06-18 DIAGNOSIS — N186 End stage renal disease: Secondary | ICD-10-CM

## 2024-06-18 DIAGNOSIS — G911 Obstructive hydrocephalus: Secondary | ICD-10-CM | POA: Diagnosis not present

## 2024-06-18 DIAGNOSIS — G919 Hydrocephalus, unspecified: Secondary | ICD-10-CM

## 2024-06-18 DIAGNOSIS — I619 Nontraumatic intracerebral hemorrhage, unspecified: Secondary | ICD-10-CM | POA: Diagnosis not present

## 2024-06-18 DIAGNOSIS — Z87891 Personal history of nicotine dependence: Secondary | ICD-10-CM

## 2024-06-18 DIAGNOSIS — I120 Hypertensive chronic kidney disease with stage 5 chronic kidney disease or end stage renal disease: Secondary | ICD-10-CM

## 2024-06-18 HISTORY — PX: LAPAROSCOPIC REVISION VENTRICULAR-PERITONEAL (V-P) SHUNT: SHX5924

## 2024-06-18 HISTORY — PX: VENTRICULOPERITONEAL SHUNT: SHX204

## 2024-06-18 LAB — BASIC METABOLIC PANEL WITH GFR
Anion gap: 18 — ABNORMAL HIGH (ref 5–15)
Anion gap: 19 — ABNORMAL HIGH (ref 5–15)
BUN: 57 mg/dL — ABNORMAL HIGH (ref 6–20)
BUN: 67 mg/dL — ABNORMAL HIGH (ref 6–20)
CO2: 26 mmol/L (ref 22–32)
CO2: 23 mmol/L (ref 22–32)
Calcium: 11 mg/dL — ABNORMAL HIGH (ref 8.9–10.3)
Calcium: 10.7 mg/dL — ABNORMAL HIGH (ref 8.9–10.3)
Chloride: 87 mmol/L — ABNORMAL LOW (ref 98–111)
Chloride: 90 mmol/L — ABNORMAL LOW (ref 98–111)
Creatinine, Ser: 3.56 mg/dL — ABNORMAL HIGH (ref 0.44–1.00)
Creatinine, Ser: 4.47 mg/dL — ABNORMAL HIGH (ref 0.44–1.00)
GFR, Estimated: 16 mL/min — ABNORMAL LOW (ref >60)
GFR, Estimated: 12 mL/min — ABNORMAL LOW (ref >60)
Glucose, Bld: 106 mg/dL — ABNORMAL HIGH (ref 70–99)
Glucose, Bld: 153 mg/dL — ABNORMAL HIGH (ref 70–99)
Potassium: 4.9 mmol/L (ref 3.5–5.1)
Potassium: 4.7 mmol/L (ref 3.5–5.1)
Sodium: 131 mmol/L — ABNORMAL LOW (ref 135–145)
Sodium: 132 mmol/L — ABNORMAL LOW (ref 135–145)

## 2024-06-18 LAB — GLUCOSE, CAPILLARY
Glucose-Capillary: 104 mg/dL — ABNORMAL HIGH (ref 70–99)
Glucose-Capillary: 108 mg/dL — ABNORMAL HIGH (ref 70–99)
Glucose-Capillary: 125 mg/dL — ABNORMAL HIGH (ref 70–99)
Glucose-Capillary: 136 mg/dL — ABNORMAL HIGH (ref 70–99)
Glucose-Capillary: 137 mg/dL — ABNORMAL HIGH (ref 70–99)
Glucose-Capillary: 145 mg/dL — ABNORMAL HIGH (ref 70–99)
Glucose-Capillary: 163 mg/dL — ABNORMAL HIGH (ref 70–99)

## 2024-06-18 LAB — CBC
HCT: 29.2 % — ABNORMAL LOW (ref 36.0–46.0)
HCT: 28.2 % — ABNORMAL LOW (ref 36.0–46.0)
Hemoglobin: 9.3 g/dL — ABNORMAL LOW (ref 12.0–15.0)
Hemoglobin: 9 g/dL — ABNORMAL LOW (ref 12.0–15.0)
MCH: 31.3 pg (ref 26.0–34.0)
MCH: 31.6 pg (ref 26.0–34.0)
MCHC: 31.8 g/dL (ref 30.0–36.0)
MCHC: 31.9 g/dL (ref 30.0–36.0)
MCV: 98.3 fL (ref 80.0–100.0)
MCV: 98.9 fL (ref 80.0–100.0)
Platelets: 177 K/uL (ref 150–400)
Platelets: 153 K/uL (ref 150–400)
RBC: 2.97 MIL/uL — ABNORMAL LOW (ref 3.87–5.11)
RBC: 2.85 MIL/uL — ABNORMAL LOW (ref 3.87–5.11)
RDW: 15.9 % — ABNORMAL HIGH (ref 11.5–15.5)
RDW: 15.4 % (ref 11.5–15.5)
WBC: 4 K/uL (ref 4.0–10.5)
WBC: 6.5 K/uL (ref 4.0–10.5)
nRBC: 0 % (ref 0.0–0.2)
nRBC: 0 % (ref 0.0–0.2)

## 2024-06-18 LAB — HEPATITIS B SURFACE ANTIGEN: Hepatitis B Surface Ag: NONREACTIVE

## 2024-06-18 LAB — MAGNESIUM: Magnesium: 3.1 mg/dL — ABNORMAL HIGH (ref 1.7–2.4)

## 2024-06-18 LAB — PHOSPHORUS: Phosphorus: 5.1 mg/dL — ABNORMAL HIGH (ref 2.5–4.6)

## 2024-06-18 LAB — PROTIME-INR
INR: 1.1 (ref 0.8–1.2)
Prothrombin Time: 15 s (ref 11.4–15.2)

## 2024-06-18 SURGERY — SHUNT INSERTION VENTRICULAR-PERITONEAL
Anesthesia: General

## 2024-06-18 MED ORDER — CLONIDINE HCL 0.1 MG PO TABS
0.3000 mg | ORAL_TABLET | ORAL | Status: DC
  Administered 2024-06-22: 0.3 mg
  Filled 2024-06-18 (×2): qty 3

## 2024-06-18 MED ORDER — HYDRALAZINE HCL 50 MG PO TABS
100.0000 mg | ORAL_TABLET | ORAL | Status: DC
  Administered 2024-06-18 – 2024-06-20 (×3): 100 mg
  Filled 2024-06-18 (×4): qty 2

## 2024-06-18 MED ORDER — BUPIVACAINE-EPINEPHRINE (PF) 0.5% -1:200000 IJ SOLN
INTRAMUSCULAR | Status: AC
  Filled 2024-06-18: qty 30

## 2024-06-18 MED ORDER — SUGAMMADEX SODIUM 200 MG/2ML IV SOLN
INTRAVENOUS | Status: DC | PRN
  Administered 2024-06-18: 200 mg via INTRAVENOUS

## 2024-06-18 MED ORDER — MEPERIDINE HCL 25 MG/ML IJ SOLN: 6.2500 mg | INTRAMUSCULAR | Status: DC | PRN

## 2024-06-18 MED ORDER — THROMBIN 5000 UNITS EX SOLR
OROMUCOSAL | Status: DC | PRN
  Administered 2024-06-18: 5 mL via TOPICAL

## 2024-06-18 MED ORDER — ROCURONIUM BROMIDE 10 MG/ML (PF) SYRINGE
PREFILLED_SYRINGE | INTRAVENOUS | Status: DC | PRN
  Administered 2024-06-18: 50 mg via INTRAVENOUS
  Administered 2024-06-18: 40 mg via INTRAVENOUS
  Administered 2024-06-18: 50 mg via INTRAVENOUS

## 2024-06-18 MED ORDER — BUPIVACAINE-EPINEPHRINE 0.5% -1:200000 IJ SOLN
INTRAMUSCULAR | Status: DC | PRN
  Administered 2024-06-18: 8 mL

## 2024-06-18 MED ORDER — THROMBIN 5000 UNITS EX KIT
PACK | CUTANEOUS | Status: AC
  Filled 2024-06-18: qty 2

## 2024-06-18 MED ORDER — FENTANYL CITRATE (PF) 250 MCG/5ML IJ SOLN
INTRAMUSCULAR | Status: DC | PRN
  Administered 2024-06-18: 50 ug via INTRAVENOUS
  Administered 2024-06-18: 100 ug via INTRAVENOUS

## 2024-06-18 MED ORDER — AMLODIPINE BESYLATE 5 MG PO TABS
10.0000 mg | ORAL_TABLET | ORAL | Status: DC
  Filled 2024-06-18: qty 2

## 2024-06-18 MED ORDER — ORAL CARE MOUTH RINSE: 15.0000 mL | Freq: Once | OROMUCOSAL | Status: AC

## 2024-06-18 MED ORDER — LIDOCAINE 2% (20 MG/ML) 5 ML SYRINGE
INTRAMUSCULAR | Status: AC
  Filled 2024-06-18: qty 5

## 2024-06-18 MED ORDER — OXYCODONE HCL 5 MG PO TABS: 5.0000 mg | ORAL_TABLET | Freq: Once | ORAL | Status: DC | PRN

## 2024-06-18 MED ORDER — LIDOCAINE 2% (20 MG/ML) 5 ML SYRINGE
INTRAMUSCULAR | Status: DC | PRN
  Administered 2024-06-18: 60 mg via INTRAVENOUS

## 2024-06-18 MED ORDER — CARVEDILOL 12.5 MG PO TABS
25.0000 mg | ORAL_TABLET | ORAL | Status: DC
  Administered 2024-06-18 – 2024-06-23 (×5): 25 mg
  Filled 2024-06-18 (×7): qty 2

## 2024-06-18 MED ORDER — SODIUM CHLORIDE 0.9 % IV SOLN: INTRAVENOUS | Status: DC

## 2024-06-18 MED ORDER — INSULIN ASPART 100 UNIT/ML IJ SOLN: 0.0000 [IU] | INTRAMUSCULAR | Status: DC | PRN

## 2024-06-18 MED ORDER — OXYCODONE HCL 5 MG/5ML PO SOLN: 5.0000 mg | Freq: Once | ORAL | Status: DC | PRN

## 2024-06-18 MED ORDER — BUPIVACAINE-EPINEPHRINE (PF) 0.25% -1:200000 IJ SOLN
INTRAMUSCULAR | Status: AC
  Filled 2024-06-18: qty 30

## 2024-06-18 MED ORDER — FENTANYL CITRATE (PF) 250 MCG/5ML IJ SOLN
INTRAMUSCULAR | Status: AC
  Filled 2024-06-18: qty 5

## 2024-06-18 MED ORDER — CHLORHEXIDINE GLUCONATE 0.12 % MT SOLN
15.0000 mL | Freq: Once | OROMUCOSAL | Status: AC
  Administered 2024-06-18: 15 mL via OROMUCOSAL

## 2024-06-18 MED ORDER — ROCURONIUM BROMIDE 10 MG/ML (PF) SYRINGE
PREFILLED_SYRINGE | INTRAVENOUS | Status: AC
  Filled 2024-06-18: qty 10

## 2024-06-18 MED ORDER — FENTANYL CITRATE (PF) 100 MCG/2ML IJ SOLN: 25.0000 ug | INTRAMUSCULAR | Status: DC | PRN

## 2024-06-18 MED ORDER — PROPOFOL 10 MG/ML IV BOLUS
INTRAVENOUS | Status: AC
Start: 2024-06-18 — End: 2024-06-18
  Filled 2024-06-18: qty 20

## 2024-06-18 MED ORDER — OXIDIZED CELLULOSE EX PADS
1.0000 | MEDICATED_PAD | Freq: Once | CUTANEOUS | Status: DC
  Filled 2024-06-18: qty 1

## 2024-06-18 MED ORDER — PROPOFOL 10 MG/ML IV BOLUS
INTRAVENOUS | Status: DC | PRN
  Administered 2024-06-18: 140 mg via INTRAVENOUS

## 2024-06-18 MED ORDER — CHLORHEXIDINE GLUCONATE CLOTH 2 % EX PADS
6.0000 | MEDICATED_PAD | Freq: Every day | CUTANEOUS | Status: DC
  Administered 2024-06-20: 6 via TOPICAL

## 2024-06-18 MED ORDER — CEFAZOLIN SODIUM-DEXTROSE 2-4 GM/100ML-% IV SOLN
2.0000 g | Freq: Three times a day (TID) | INTRAVENOUS | Status: DC
  Administered 2024-06-18 – 2024-06-19 (×3): 2 g via INTRAVENOUS
  Filled 2024-06-18 (×3): qty 100

## 2024-06-18 MED ORDER — ONDANSETRON HCL 4 MG/2ML IJ SOLN
INTRAMUSCULAR | Status: DC | PRN
  Administered 2024-06-18: 4 mg via INTRAVENOUS

## 2024-06-18 SURGICAL SUPPLY — 49 items
BAG COUNTER SPONGE SURGICOUNT (BAG) ×1 IMPLANT
BENZOIN TINCTURE PRP APPL 2/3 (GAUZE/BANDAGES/DRESSINGS) ×1 IMPLANT
BLADE CLIPPER SURG (BLADE) ×2 IMPLANT
BUR PRECISION FLUTE 6.0 (BURR) ×1 IMPLANT
CANISTER SUCTION 3000ML PPV (SUCTIONS) ×1 IMPLANT
CLAMP SUTURE YELLOW 5 PAIRS (MISCELLANEOUS) ×1 IMPLANT
CLIP RANEY DISP (INSTRUMENTS) IMPLANT
DRAPE INCISE IOBAN 85X60 (DRAPES) ×1 IMPLANT
DRAPE SURG 17X23 STRL (DRAPES) ×6 IMPLANT
DRAPE SURG ORHT 6 SPLT 77X108 (DRAPES) ×2 IMPLANT
DRSG OPSITE POSTOP 3X4 (GAUZE/BANDAGES/DRESSINGS) IMPLANT
DRSG OPSITE POSTOP 4X6 (GAUZE/BANDAGES/DRESSINGS) IMPLANT
ELECTRODE REM PT RTRN 9FT ADLT (ELECTROSURGICAL) ×1 IMPLANT
GAUZE 4X4 16PLY ~~LOC~~+RFID DBL (SPONGE) IMPLANT
GLOVE BIO SURGEON STRL SZ8 (GLOVE) ×1 IMPLANT
GLOVE BIO SURGEON STRL SZ8.5 (GLOVE) ×1 IMPLANT
GLOVE EXAM NITRILE XL STR (GLOVE) IMPLANT
GOWN STRL REUS W/ TWL LRG LVL3 (GOWN DISPOSABLE) IMPLANT
GOWN STRL REUS W/ TWL XL LVL3 (GOWN DISPOSABLE) IMPLANT
KIT BASIN OR (CUSTOM PROCEDURE TRAY) ×1 IMPLANT
KIT TURNOVER KIT B (KITS) ×1 IMPLANT
NDL HYPO 22X1.5 SAFETY MO (MISCELLANEOUS) ×1 IMPLANT
NEEDLE HYPO 22X1.5 SAFETY MO (MISCELLANEOUS) ×1 IMPLANT
PACK LAMINECTOMY NEURO (CUSTOM PROCEDURE TRAY) ×1 IMPLANT
PAD ARMBOARD POSITIONER FOAM (MISCELLANEOUS) ×3 IMPLANT
SHEATH PERITONEAL INTRO 46 (SHEATH) IMPLANT
SHEATH PERITONEAL INTRO 61 (SHEATH) IMPLANT
SOLN 0.9% NACL 1000 ML (IV SOLUTION) ×1 IMPLANT
SOLN 0.9% NACL POUR BTL 1000ML (IV SOLUTION) ×1 IMPLANT
SOLN STERILE WATER 1000 ML (IV SOLUTION) ×1 IMPLANT
SOLN STERILE WATER BTL 1000 ML (IV SOLUTION) ×1 IMPLANT
SPONGE SURGIFOAM ABS GEL SZ50 (HEMOSTASIS) ×1 IMPLANT
SPONGE T-LAP 4X18 ~~LOC~~+RFID (SPONGE) IMPLANT
STAPLER SKIN PROX 35W (STAPLE) ×1 IMPLANT
STRIP CLOSURE SKIN 1/2X4 (GAUZE/BANDAGES/DRESSINGS) ×1 IMPLANT
SUT ETHILON 3 0 PS 1 (SUTURE) IMPLANT
SUT NOVA 0 T19/GS 22DT (SUTURE) IMPLANT
SUT NURALON 4 0 TR CR/8 (SUTURE) IMPLANT
SUT SILK 0 100YDS SPOOL (SUTURE) IMPLANT
SUT SILK 3 0 SH 30 (SUTURE) ×1 IMPLANT
SUT VIC AB 1 CT1 18XBRD ANBCTR (SUTURE) IMPLANT
SUT VIC AB 2-0 CP2 18 (SUTURE) ×1 IMPLANT
SUT VIC AB 3-0 SH 8-18 (SUTURE) ×1 IMPLANT
SYR CONTROL 10ML LL (SYRINGE) ×1 IMPLANT
TOWEL GREEN STERILE (TOWEL DISPOSABLE) ×1 IMPLANT
TOWEL GREEN STERILE FF (TOWEL DISPOSABLE) ×1 IMPLANT
TRAY FOLEY MTR SLVR 16FR STAT (SET/KITS/TRAYS/PACK) IMPLANT
UNDERPAD 30X36 HEAVY ABSORB (UNDERPADS AND DIAPERS) ×1 IMPLANT
VALVE CERTAS PLUS SYSTEM (Valve) IMPLANT

## 2024-06-18 NOTE — Anesthesia Procedure Notes (Signed)
 Procedure Name: Intubation Date/Time: 06/18/2024 12:08 PM  Performed by: Marva Lonni PARAS, CRNAPre-anesthesia Checklist: Patient identified, Emergency Drugs available, Suction available and Patient being monitored Patient Re-evaluated:Patient Re-evaluated prior to induction Oxygen  Delivery Method: Circle System Utilized Preoxygenation: Pre-oxygenation with 100% oxygen  Induction Type: Rapid sequence Laryngoscope Size: Mac and 3 Grade View: Grade I Tube type: Oral Tube size: 7.0 mm Number of attempts: 1 Airway Equipment and Method: Stylet Placement Confirmation: ETT inserted through vocal cords under direct vision, positive ETCO2 and breath sounds checked- equal and bilateral Secured at: 21 cm Tube secured with: Tape Dental Injury: Teeth and Oropharynx as per pre-operative assessment

## 2024-06-18 NOTE — Op Note (Signed)
 Brief history: The patient is a 43 year old female with history of renal and pancreatic transplant, PEG tube, 2 intracerebral hemorrhages, hydrocephalus, prolonged ventriculostomy, excetra who failed attempts at removing her ventriculostomy.  I discussed the situation with the patient's mother and recommended placement of a ventriculoperitoneal shunt.  She has consented on behalf of the patient.  Preop diagnosis: Intracerebral hemorrhage, intraventricular hemorrhage, hydrocephalus  Postop diagnosis: The same  Procedure: Placement of right retroauricular Certas ventriculoperitoneal shunt with laparoscopic assistance; removal of ventriculostomy  Surgeon: Dr. Chyrl [name] and Dr. Georgette Poli  Complications: None  Drains: None  Description of procedure: The patient was brought to the operating room by the anesthesia team.  General endotracheal anesthesia was induced.  The patient's retroauricular region was then shaved with clippers.  This region as well as her abdomen, thorax, and neck were prepared with ChloraPrep.  Sterile drapes were applied.  Dr. Poli did the abdominal exposure/laparoscopy.  I injected the area to be incised with Marcaine with epinephrine  solution.  I made a L-shaped incision in the patient's right retroauricular region.  I used the cerebellar retractor for exposure.  I then used the shunt passer to pass the peritoneal catheter from the retroauricular incision down to the abdominal incision.  I encountered scar tissue in the patient's right neck.  I therefore performed a second incision in her right lower neck and tunneled the shunt from the retroauricular incision to the neck incision and then down to the abdominal incision with the shunt passer.  I exposed the underlying calvarium with electrocautery.  I then used a high-speed drill to create a right posterior parietal burr hole.  I coagulated the underlying dura with electrocautery.  I cannulated the ventricular  system with the ventricular catheter and approximately 5 cm.  I then remove the stylette and threaded the catheter freehand deeper.  There was vigorous flow of cerebrospinal fluid.  I then connected the ventricular catheter to the shunt.  I secured the connection with a 0 silk suture tied.  I seated the shunt valve in the subcutaneous pocket which I created with the tonsil forceps.  There was good flow with CSF distally through the peritoneal catheter.  Dr. Poli then placed the peritoneal catheter into the peritoneum.  Dr. Poli closed the abdominal incisions.  I reapproximated the patient's galea with interrupted 2-0 Vicryl suture.  I reapproximated subcutaneous tissue in the cervical incision with 2-0 Vicryl suture.  I reapproximated the skin at these incisions with stainless steel staples.  The wound was then coated with bacitracin ointment.  Sterile drapes were applied.  The drapes were removed.  By report all sponge, instrument, and needle counts were correct at the end of this case.

## 2024-06-18 NOTE — Anesthesia Preprocedure Evaluation (Addendum)
 Anesthesia Evaluation  Patient identified by MRN, date of birth, ID band Patient confused and Patient unresponsive    Reviewed: Allergy & Precautions, H&P , NPO status , Patient's Chart, lab work & pertinent test results, Unable to perform ROS - Chart review only  Airway Mallampati: III       Dental  (+) Edentulous Upper, Missing,    Pulmonary neg pulmonary ROS, former smoker   Pulmonary exam normal        Cardiovascular hypertension, Pt. on medications negative cardio ROS Normal cardiovascular exam  7 /25    1. Left ventricular ejection fraction, by estimation, is 60 to 65%. The  left ventricle has normal function. The left ventricle has no regional  wall motion abnormalities. There is mild left ventricular hypertrophy.  Left ventricular diastolic parameters  are indeterminate.   2. Right ventricular systolic function is normal. The right ventricular  size is normal.   3. A small pericardial effusion is present.   4. Trivial mitral valve regurgitation.   5. The aortic valve is tricuspid. Aortic valve regurgitation is not  visualized. Aortic valve sclerosis/calcification is present, without any  evidence of aortic stenosis.   6. The inferior vena cava is normal in size with greater than 50%  respiratory variability, suggesting right atrial pressure of 3 mmHg.     Neuro/Psych  PSYCHIATRIC DISORDERS Anxiety      Neuromuscular disease negative neurological ROS  negative psych ROS   GI/Hepatic negative GI ROS, Neg liver ROS,,,(+)     substance abuse    Endo/Other  negative endocrine ROSdiabetes    Renal/GU ESRF and DialysisRenal diseasenegative Renal ROS  negative genitourinary   Musculoskeletal negative musculoskeletal ROS (+)  narcotic dependent  Abdominal   Peds negative pediatric ROS (+)  Hematology negative hematology ROS (+) Blood dyscrasia, anemia   Anesthesia Other Findings    Reproductive/Obstetrics negative OB ROS                              Anesthesia Physical Anesthesia Plan  ASA: 4  Anesthesia Plan: General   Post-op Pain Management:    Induction: Intravenous  PONV Risk Score and Plan: 2 and Treatment may vary due to age or medical condition, Ondansetron  and Dexamethasone   Airway Management Planned: Nasal Cannula and Oral ETT  Additional Equipment: None  Intra-op Plan:   Post-operative Plan: Extubation in OR  Informed Consent: I have reviewed the patients History and Physical, chart, labs and discussed the procedure including the risks, benefits and alternatives for the proposed anesthesia with the patient or authorized representative who has indicated his/her understanding and acceptance.     Dental advisory given  Plan Discussed with: CRNA and Anesthesiologist  Anesthesia Plan Comments:          Anesthesia Quick Evaluation

## 2024-06-18 NOTE — Progress Notes (Signed)
 Patient ID: [name] [name], female   DOB: March 04, 1981, 43 y.o.   MRN: [id number] [facility] Progress Note   Subjective:   Pt in OR this am    Objective:   BP (!) 128/54   Pulse (!) 116   Temp 98.6 F (37 C)   Resp (!) 28   Ht 5' 3 (1.6 m)   Wt 45 kg   SpO2 99%   BMI 17.57 kg/m   Physical Exam: Pt not in room  OP HD: [facility] MWF 3h  B400  55.2kg  2K bath   AVF  Heparin  none Mircera 150 mcg q 2 wks, due 9/24   Assessment/ Plan Acute right intracranial hemorrhage: Associated with uncontrolled hypertension and status post ventriculostomy.  Extubated 9/22. Ventriculostomy fluid remains bloody. Plan for VP shunt soon possibly.  ESRD/ hx of renal allograft failure: usual HD MWF but this week will be TTS.  Volume: wt's are up this week, 50- 53kg, had been high 40s to low 50s. Will ^ UF w/ next HD.  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 Nutrition: due to [place] her tube feeds were changed to Nepro Anemia of esrd: Hb 8-9 range with ongoing overt mild loss from ventriculostomy. Getting darbe 100 mcg sq weekly.  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  Ca up, suspect immobility --> gave 1 dose of pamidronate 10/12 Hypertension: BP's were very high, then dropped low. Now is on norvasc , clonidine  and coreg  on non-hd days only. BP's sig better.  Volume: under dry wt 5-7kg. UF's ~ 1.5- 2.5 L.  Dispo: per pt's mother, pt is to be full scope care, full code.    Myer Fret  MD  [facility] 06/18/2024, 12:54 PM  Recent Labs  Lab 06/17/24 0327 06/18/24 0353  HGB 9.3* 9.3*  CALCIUM  11.9* 11.0*  PHOS  --  5.1*  CREATININE 6.99* 3.56*  K 5.2* 4.9    Inpatient medications:  [MAR Hold] amLODipine   10 mg Per Tube Once per day on Sunday Tuesday Thursday Saturday   [facility] Hold] carvedilol   25 mg Per Tube 2 times per day on Sunday Tuesday  Thursday Saturday   [facility] Hold] Chlorhexidine  Gluconate Cloth  6 each Topical Q0600   [MAR Hold] cloNIDine   0.3 mg Per Tube 3 times per day on Sunday Tuesday Thursday Saturday   [facility] Hold] cycloSPORINE   200 mg Per Tube QPM   [MAR Hold] cycloSPORINE   225 mg Per Tube Daily   [MAR Hold] darbepoetin (ARANESP ) injection - DIALYSIS  100 mcg Subcutaneous Q Wed-1800   [MAR Hold] fiber  1 packet Per Tube TID   [MAR Hold] hydrALAZINE   100 mg Per Tube 3 times per day on Sunday Tuesday Thursday Saturday   [facility] Hold] insulin  aspart  0-9 Units Subcutaneous Q4H   [MAR Hold] lacosamide   100 mg Per Tube BID   [MAR Hold] multivitamin with minerals  1 tablet Per Tube Daily   [MAR Hold] mycophenolate   500 mg Per Tube BID   [MAR Hold] mouth rinse  15 mL Mouth Rinse 4 times per day   [MAR Hold] pantoprazole  (PROTONIX ) IV  40 mg Intravenous QHS   [MAR Hold] predniSONE   15 mg Per Tube Q breakfast    sodium chloride  10 mL/hr at 06/18/24 1103   [MAR Hold] anticoagulant sodium citrate      [MAR Hold] anticoagulant sodium citrate   feeding supplement (NEPRO CARB STEADY) Stopped (06/18/24 0000)   [MAR Hold] acetaminophen  **OR** [MAR Hold] acetaminophen  (TYLENOL ) oral liquid 160 mg/5 mL **OR** [MAR Hold] acetaminophen , [MAR Hold] alteplase , [MAR Hold] anticoagulant sodium citrate , [MAR Hold] anticoagulant sodium citrate , [MAR Hold] feeding supplement (NEPRO CARB STEADY), [MAR Hold] heparin , [MAR Hold] heparin , insulin  aspart, [MAR Hold] labetalol , [MAR Hold] lidocaine  (PF), [MAR Hold] lidocaine  (PF), [MAR Hold] lidocaine -prilocaine , [MAR Hold] lidocaine -prilocaine , [MAR Hold] midodrine , [MAR Hold] mouth rinse, [MAR Hold] pentafluoroprop-tetrafluoroeth, [MAR Hold] pentafluoroprop-tetrafluoroeth, [MAR Hold] sennosides

## 2024-06-18 NOTE — Anesthesia Postprocedure Evaluation (Signed)
 Anesthesia Post Note  Patient: [name]  Procedure(s) Performed: SHUNT INSERTION VENTRICULAR-PERITONEAL REVISION, PROCEDURE INVOLVING VENTRICULOPERITONEAL SHUNT, LAPAROSCOPIC     Patient location during evaluation: PACU Anesthesia Type: General Level of consciousness: awake and alert Pain management: pain level controlled Vital Signs Assessment: post-procedure vital signs reviewed and stable Respiratory status: spontaneous breathing, nonlabored ventilation, respiratory function stable and patient connected to nasal cannula oxygen  Cardiovascular status: blood pressure returned to baseline and stable Postop Assessment: no apparent nausea or vomiting Anesthetic complications: no   No notable events documented.  Last Vitals:  Vitals:   06/18/24 2000 06/18/24 2100  BP: (!) 144/74 123/60  Pulse: 89 93  Resp: 17 13  Temp: 36.6 C   SpO2: 94% 93%    Last Pain:  Vitals:   06/18/24 1600  TempSrc: Axillary  PainSc:    Pain Goal:                   [name] [name]

## 2024-06-18 NOTE — TOC Progression Note (Signed)
 Transition of Care [facility]) - Progression Note    Patient Details  Name: [name] [name] MRN: [id number] Date of Birth: 11/04/1980  [facility] of [facility]) CM/SW Contact  Inocente GORMAN Kindle, LCSW Phone Number: 06/18/2024, 9:46 AM  Clinical Narrative:    CSW continuing to follow for medical progression.    Expected Discharge Plan: Skilled Nursing Facility Barriers to Discharge: Continued Medical Work up, English as a second language teacher               Expected Discharge Plan and Services In-house Referral: Clinical Social Work   Post Acute Care Choice: Skilled Nursing Facility Living arrangements for the past 2 months: Apartment                                       Social Drivers of Health (SDOH) Interventions SDOH Screenings   Food Insecurity: Patient Unable To Answer (05/20/2024)  Housing: Patient Unable To Answer (05/20/2024)  Transportation Needs: Patient Unable To Answer (05/20/2024)  Utilities: Patient Unable To Answer (05/20/2024)  Social Connections: Unknown (10/04/2022)   Received from [facility]  Stress: No Stress Concern Present (04/16/2024)   Received from [facility]  Tobacco Use: Medium Risk (04/27/2024)    Readmission Risk Interventions    05/20/2024    4:16 PM 03/31/2024   11:27 AM 11/17/2021   12:15 PM  Readmission Risk Prevention Plan  Transportation Screening Complete Complete Complete  Medication Review Oceanographer) Complete Complete Complete  PCP or Specialist appointment within 3-5 days of discharge Complete  Complete  HRI or Home Care Consult Complete Complete Complete  SW Recovery Care/Counseling Consult Complete Complete Complete  Palliative Care Screening Complete Not Applicable Not Applicable  Skilled Nursing Facility Complete Not Applicable Patient Refused

## 2024-06-18 NOTE — Progress Notes (Signed)
 EVD clamped and to remain clamped until pt procedure per Dr. Mavis.

## 2024-06-18 NOTE — Op Note (Addendum)
 SHUNT INSERTION VENTRICULAR-PERITONEAL  Procedure Note  [name] [name] 05/19/2024 - 06/18/2024   Pre-op Diagnosis: HYDROCEPHALUS     Post-op Diagnosis: same  Procedure(s): DIAGNOSTIC LAPAROSCOPY WITH INTRA-ABDOMINAL ASSISTANCE VENTRICULAR-PERITONEAL SHUNT PLACEMENT  Co-Surgeons: Reyes Mavis Vicenta Vernetta, MD  Assistant:  Larnell Costain, MD [facility] Resident  Anesthesia: General  Staff:  Circulator: Oral Vannie NOVAK, RN; Shona Greig DASEN, RN; Prentiss Shona NOVAK, RN Relief Circulator: Rosann Delon GRADE, RN Relief Scrub: Bridgett Waddell PARAS, RN Scrub Person: Key, Delon HERO, RN Vendor Representative : Marcos Maffucci  Estimated Blood Loss:                Indications: This is a 43 year old female undergoing ventriculoperitoneal shunt placement by neurosurgery.  General surgery is assisting with the intra-abdominal placement of the shunt given the patient's complex abdominal surgery including a combined pancreas and kidney transplant as well as gastrostomy tube insertion  Procedure: The patient had been completely prepped and draped for the neurosurgical and abdominal portion of the procedure.  We made a small incision in the patient's right upper quadrant underneath the costal margin with a scalpel.  We then dissected down through the subtenons tissue to the anterior fascia which was then opened.  We then excised some of the underlying muscle fibers and then identified the posterior fascia and peritoneum.  We then opened this under direct vision gaining entrance into the abdominal cavity.  We placed a 5 mm trocar through this area and then was able to insufflate the abdomen with carbon dioxide.  We could easily visualize the stomach as well as a moderate amount of omentum and bowel fixated to the midline of the abdominal wall.  There was a window just above this in the midline as well as a clear area going down the right side of the abdomen toward the pelvis.  I made a small incision with  the #15 blade at the patient's upper midline and under direct vision we placed another 5 mm trocar.  Once Dr. Mavis was able to tunnel the catheter from the head down through the neck, chest wall, and upper abdomen we were then able to direct it toward the right upper quadrant incision.  We removed the trocar and he was able to tunnel the catheter through this opening.  Once the catheter was placed in an appropriate length we placed it through the opening in the peritoneum.  We then reinsufflated the abdomen and with a laparoscopic instrument were able to easily direct the catheter down the right side of the abdomen into the pelvis.  Prior to placing catheter was ensured to be working well.  We then removed the trocars and deflated the abdomen.  We closed the fascia around the catheter site loosely with 1 interrupted and 1 figure-of-eight 0 Novafil suture.  The subcutaneous tissue was closed with interrupted 3-0 Vicryl sutures and the skin was closed with running 4-0 Monocryl.  The midline trocar site was also closed with a 4-0 Monocryl suture.  Dermabond was applied to both incisions.  Patient tolerated the procedure well.  All the counts were correct at the end of the procedure.  The patient was then extubated in the operating room and taken in a stable condition to the recovery room.          Vicenta Vernetta   Date: 06/18/2024  Time: 1:35 PM

## 2024-06-18 NOTE — Progress Notes (Signed)
 NAME:  [name] [name], MRN:  [id number], DOB:  04-20-1981, LOS: 30 ADMISSION DATE:  05/19/2024 CONSULTATION DATE:  9/15/20025 REFERRING MD: Patsey - EDP, CHIEF COMPLAINT:  Code Stroke, ICH   History of Present Illness:  43 year old woman who presented to [facility] ED 9/15 as a Code Stroke. 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 Recent admissions 6/11-7/28 and 7/29-8/13 for ICH.  Patient presented to [facility] ED from dialysis as a Code Stroke. CT Head demonstrated large volume IVH with moderate hydrocephalus, acute hemorrhage of medial R temporal lobe and small foci of residual/recurrent hemorrhage in the L cerebellar hemisphere.   Patient was seen by stroke team and neurosurgery, EVD was placed on left side, PCCM was consulted for medical evaluation and admission to neuro ICU  Pertinent Medical History:   Past Medical History:  Diagnosis Date   Anemia    Anxiety    DM (diabetes mellitus), type 1 (HCC)    ESRD on hemodialysis (HCC)    Gastroparesis    History of simultaneous kidney and pancreas transplant (HCC) 2014   Hypertension    Ischemic cerebrovascular accident (CVA) (HCC) 11/2021   Narcotic abuse (HCC)    Non compliance with medical treatment    Stroke [facility])    Vision loss    Significant Hospital Events: Including procedures, antibiotic start and stop dates in addition to other pertinent events   9/15 admitted with ICH plus IVH, EVD was placed for hydrocephalus, intubated 9/16 remain on clevidipine  infusion, EVD is draining bloody fluid 9/17 continue to require clevidipine  infusion, tolerated spontaneous breathing trial 9/19: this am, dialysis ongoing. Neurosx plans to continue ventriculostomy for now. Remains on cleviprex  to maintain BP goal. Tmax 100.1. despite plt decrease 4T score 0. Will maintain heparin  as is for  now and closely follow trend.  9/22 palliative care meeting.  Family is insistent that they want full scope of care 9/23 Extubated 9/27 Spiking low grade fevers, wheezing on exam, CXR normal  9/30 No tolerating when EVD is raised. Still bloody fluid. CT head will be done. 10/1 -developed fevers pancultures drawn and broad-spectrum antibiotics started 10/2 became afebrile after starting antibiotics, did not tolerate raising EVD 10/3 serum potassium 6.5, receiving hemodialysis, no overnight issues.  Did not tolerate weaning EVD 10/4 EVD remain at 10, no issues 10/5 EVD remains at 10, afebrile 10/6 EVD was raised to 30 cm water , tolerating well, remained afebrile 10/7 EVD is at 30 cm water , CT scan will be done tomorrow morning, no overnight issues 10/8 she became somnolent, CT head showed increasing hydrocephalus, EVD was titrated down to 10 cm of water  with improvement in mental status 10/13 VP shunt placement scheduled for today postponed to Wednesday 10/14 no overnight issues, received HD  Interim History / Subjective:  No acute events overnight. Planning for VP shunt placement with neurosurgery and general surgery today.   Objective:  Blood pressure 137/66, pulse 82, temperature 99 F (37.2 C), temperature source Oral, resp. rate (!) 22, height 5' 3 (1.6 m), weight 52.9 kg, SpO2 100%.        Intake/Output Summary (Last 24 hours) at 06/18/2024 0901 Last data filed at 06/18/2024 0800 Gross per 24 hour  Intake 1100 ml  Output 2555 ml  Net -1455 ml   Filed Weights   06/16/24 0322 06/17/24 0800 06/17/24 1244  Weight: 53.3 kg 55.6 kg 52.9 kg  Physical Examination: Gen: no acute distress, chronically ill appearing female HEENT: [place]/AT, sclera anicteric, EVD in place Lungs: breathing comfortably on room air, clear to auscultation bilaterally CV: regular rate and rhythm, normal S1 and S2 Abd: soft, non-tender, non-distended  Ext: warm, dry, palpable pedal pulses, no edema Neuro:  somnolent, non-verbal, does not EO to command, withdraws to painful stimuli weakly throughout  Labs in chart reviewed and significant for Na 131, Cl 87, phosphorus 5.1, Mg 3.1, and Hgb 9.3.   Resolved Hospital problems  Hypertensive emergency IV Acute respiratory insufficiency Thrombocytopenia of critical illness Serratia/klebsiella pneumonia Hyperkalemia Acute encephalopathy in the setting of ICH and hydrocephalus Narcotic abuse  Assessment & Plan:  Acute right parietotemporal intraparenchymal hemorrhage with IVH with ICH score 2 due to uncontrolled hypertension Obstructive hydrocephalus status post EVD Seizure disorder Has not tolerated EVD wean.  - Continue EVD at 10 cm water  - Planning for VP shunt placement today - Continue Vimpat  - Continue neuro monitoring  End-stage renal disease on hemodialysis with failed renal transplant but functional pancreatic transplant on immunosuppressive therapy Hypervolemic hyponatremia, hypochloremia, hypercalcemia - Continue dialysis per nephrology - Continue home immunosuppression with cyclosporine , mycophenolate , prednisone   HTN - Continue Norvasc , clonidine , Coreg , hydralazine  on non HD days only  Anemia of chronic disease No active signs of bleeding. Hgb stable. - Continue to monitor CBC  Type 1 diabetes, complicated with vasculopathy and gastroparesis Not on long term insulin  anymore post pancreatic transplant. - Monitor blood sugars, continue sliding scale insulin  - Continue home immunosuppression with cyclosporine , mycophenolate , prednisone   Moderate protein calorie malnutrition - Cortrak in place, continue tube feeds  Stage 1 pressure ulcer POA - Continue wound care  Signature:   Rexene LOISE Blush, PA-C [facility] 06/18/24 9:01 AM  Please see [email or web address] for pager details.  From 7A-7P if no response, please call [phone] After hours, please call ELink [phone]

## 2024-06-18 NOTE — Transfer of Care (Signed)
 Immediate Anesthesia Transfer of Care Note  Patient: [name] [name]  Procedure(s) Performed: SHUNT INSERTION VENTRICULAR-PERITONEAL REVISION, PROCEDURE INVOLVING VENTRICULOPERITONEAL SHUNT, LAPAROSCOPIC  Patient Location: PACU  Anesthesia Type:General  Level of Consciousness: drowsy and patient cooperative  Airway & Oxygen  Therapy: Patient Spontanous Breathing  Post-op Assessment: Report given to RN and Post -op Vital signs reviewed and stable  Post vital signs: Reviewed and stable  Last Vitals:  Vitals Value Taken Time  BP 169/73 06/18/24 14:00  Temp    Pulse 72 06/18/24 14:01  Resp 17 06/18/24 14:01  SpO2 96 % 06/18/24 14:01  Vitals shown include unfiled device data.  Last Pain:  Vitals:   06/18/24 1057  TempSrc:   PainSc: 0-No pain         Complications: No notable events documented.

## 2024-06-18 NOTE — Progress Notes (Signed)
 Subjective: The patient is alert and in no apparent distress.  Her mother is at the bedside.  Objective: Vital signs in last 24 hours: Temp:  [97.5 F (36.4 C)-99 F (37.2 C)] 99 F (37.2 C) (10/15 0800) Pulse Rate:  [79-95] 83 (10/15 0700) Resp:  [5-31] 24 (10/15 0700) BP: (96-179)/(55-86) 137/65 (10/15 0700) SpO2:  [93 %-100 %] 100 % (10/15 0700) Weight:  [52.9 kg] 52.9 kg (10/14 1244) Estimated body mass index is 20.66 kg/m as calculated from the following:   Height as of this encounter: 5' 3 (1.6 m).   Weight as of this encounter: 52.9 kg.   Intake/Output from previous day: 10/14 0701 - 10/15 0700 In: 1100 [NG/GT:1100] Out: 2552 [Drains:52] Intake/Output this shift: No intake/output data recorded.  Physical exam patient is alert.  Lab Results: Recent Labs    06/17/24 0327 06/18/24 0353  WBC 4.2 4.0  HGB 9.3* 9.3*  HCT 28.0* 29.2*  PLT 164 177   BMET Recent Labs    06/17/24 0327 06/18/24 0353  NA 133* 131*  K 5.2* 4.9  CL 88* 87*  CO2 24 26  GLUCOSE 147* 106*  BUN 145* 57*  CREATININE 6.99* 3.56*  CALCIUM  11.9* 11.0*    Studies/Results: No results found.  Assessment/Plan: Hydrocephalus: The plan is for placement of ventriculoperitoneal shunt with general surgery/Dr. Vernetta.  If it is not possible to place the ventriculoperitoneal shunt we will either place a ventriculoatrial or ventriculopleural shunt.  I discussed all this with the patient's mother and answered all her questions.  LOS: 30 days     [name] [name] 06/18/2024, 8:27 AM     Patient ID: [name] [name], female   DOB: 12-06-1980, 43 y.o.   MRN: [id number]

## 2024-06-18 NOTE — Progress Notes (Signed)
 Patient ID: [name] [name], female   DOB: [birth year], 43 y.o.   MRN: [id number]   Pre Procedure note for inpatients:   [name] [name] has been scheduled for Procedure(s) with comments: SHUNT INSERTION VENTRICULAR-PERITONEAL (N/A) - VP SHUNT REVISION, PROCEDURE INVOLVING VENTRICULOPERITONEAL SHUNT, LAPAROSCOPIC (N/A) today. The various methods of treatment have been discussed with the patient. After consideration of the risks, benefits and treatment options the patient has consented to the planned procedure.   The patient has been seen and labs reviewed. There are no changes in the patient's condition to prevent proceeding with the planned procedure today.  Recent labs:  Lab Results  Component Value Date   WBC 4.0 06/18/2024   HGB 9.3 (L) 06/18/2024   HCT 29.2 (L) 06/18/2024   PLT 177 06/18/2024   GLUCOSE 106 (H) 06/18/2024   CHOL 117 05/20/2024   TRIG 103 05/23/2024   HDL 47 05/20/2024   LDLDIRECT 60.2 11/11/2021   LDLCALC 38 05/20/2024   ALT 13 06/08/2024   AST 19 06/08/2024   NA 131 (L) 06/18/2024   K 4.9 06/18/2024   CL 87 (L) 06/18/2024   CREATININE 3.56 (H) 06/18/2024   BUN 57 (H) 06/18/2024   CO2 26 06/18/2024   TSH 2.271 02/21/2024   INR 1.1 06/18/2024   HGBA1C 3.6 (L) 05/20/2024    Vicenta Poli, MD 06/18/2024 11:20 AM

## 2024-06-18 NOTE — Progress Notes (Signed)
 Palliative:  I went to visit and Clayborne is being wheeled out of room for surgery. Her mother is at bedside and tearful. I offered her emotional support. I brought her water  and tissues. She was on the phone when I returned with water  and tissues - I left and gave her some privacy.   Goals have been clear. Please contact palliative [phone] for acute palliative needs. We will continue to follow peripherally.   No charge  Bernarda Kitty, NP Palliative Medicine Team Pager [phone] (Please see [email or web address] for schedule) Team Phone [phone]

## 2024-06-19 ENCOUNTER — Inpatient Hospital Stay (HOSPITAL_COMMUNITY)

## 2024-06-19 ENCOUNTER — Encounter (HOSPITAL_COMMUNITY): Payer: Self-pay | Admitting: Neurosurgery

## 2024-06-19 DIAGNOSIS — I161 Hypertensive emergency: Secondary | ICD-10-CM | POA: Diagnosis not present

## 2024-06-19 DIAGNOSIS — Z515 Encounter for palliative care: Secondary | ICD-10-CM | POA: Diagnosis not present

## 2024-06-19 DIAGNOSIS — I615 Nontraumatic intracerebral hemorrhage, intraventricular: Secondary | ICD-10-CM | POA: Diagnosis not present

## 2024-06-19 DIAGNOSIS — G911 Obstructive hydrocephalus: Secondary | ICD-10-CM | POA: Diagnosis not present

## 2024-06-19 DIAGNOSIS — R29731 NIHSS score 31: Secondary | ICD-10-CM | POA: Diagnosis not present

## 2024-06-19 DIAGNOSIS — E440 Moderate protein-calorie malnutrition: Secondary | ICD-10-CM | POA: Diagnosis not present

## 2024-06-19 DIAGNOSIS — G9340 Encephalopathy, unspecified: Secondary | ICD-10-CM | POA: Diagnosis not present

## 2024-06-19 DIAGNOSIS — J189 Pneumonia, unspecified organism: Secondary | ICD-10-CM | POA: Diagnosis not present

## 2024-06-19 DIAGNOSIS — J150 Pneumonia due to Klebsiella pneumoniae: Secondary | ICD-10-CM | POA: Diagnosis not present

## 2024-06-19 DIAGNOSIS — I619 Nontraumatic intracerebral hemorrhage, unspecified: Secondary | ICD-10-CM | POA: Diagnosis not present

## 2024-06-19 DIAGNOSIS — J9601 Acute respiratory failure with hypoxia: Secondary | ICD-10-CM | POA: Diagnosis not present

## 2024-06-19 DIAGNOSIS — J1569 Pneumonia due to other gram-negative bacteria: Secondary | ICD-10-CM | POA: Diagnosis not present

## 2024-06-19 DIAGNOSIS — Z992 Dependence on renal dialysis: Secondary | ICD-10-CM | POA: Diagnosis not present

## 2024-06-19 DIAGNOSIS — N186 End stage renal disease: Secondary | ICD-10-CM | POA: Diagnosis not present

## 2024-06-19 LAB — CBC
HCT: 28.6 % — ABNORMAL LOW (ref 36.0–46.0)
Hemoglobin: 9.3 g/dL — ABNORMAL LOW (ref 12.0–15.0)
MCH: 31.7 pg (ref 26.0–34.0)
MCHC: 32.5 g/dL (ref 30.0–36.0)
MCV: 97.6 fL (ref 80.0–100.0)
Platelets: 169 K/uL (ref 150–400)
RBC: 2.93 MIL/uL — ABNORMAL LOW (ref 3.87–5.11)
RDW: 15.4 % (ref 11.5–15.5)
WBC: 7.5 K/uL (ref 4.0–10.5)
nRBC: 0 % (ref 0.0–0.2)

## 2024-06-19 LAB — BASIC METABOLIC PANEL WITH GFR
Anion gap: 19 — ABNORMAL HIGH (ref 5–15)
BUN: 79 mg/dL — ABNORMAL HIGH (ref 6–20)
CO2: 23 mmol/L (ref 22–32)
Calcium: 11.3 mg/dL — ABNORMAL HIGH (ref 8.9–10.3)
Chloride: 92 mmol/L — ABNORMAL LOW (ref 98–111)
Creatinine, Ser: 5 mg/dL — ABNORMAL HIGH (ref 0.44–1.00)
GFR, Estimated: 10 mL/min — ABNORMAL LOW (ref >60)
Glucose, Bld: 115 mg/dL — ABNORMAL HIGH (ref 70–99)
Potassium: 4.8 mmol/L (ref 3.5–5.1)
Sodium: 134 mmol/L — ABNORMAL LOW (ref 135–145)

## 2024-06-19 LAB — PHOSPHORUS: Phosphorus: 7.1 mg/dL — ABNORMAL HIGH (ref 2.5–4.6)

## 2024-06-19 LAB — GLUCOSE, CAPILLARY
Glucose-Capillary: 136 mg/dL — ABNORMAL HIGH (ref 70–99)
Glucose-Capillary: 146 mg/dL — ABNORMAL HIGH (ref 70–99)
Glucose-Capillary: 147 mg/dL — ABNORMAL HIGH (ref 70–99)
Glucose-Capillary: 149 mg/dL — ABNORMAL HIGH (ref 70–99)
Glucose-Capillary: 173 mg/dL — ABNORMAL HIGH (ref 70–99)
Glucose-Capillary: 189 mg/dL — ABNORMAL HIGH (ref 70–99)

## 2024-06-19 MED ORDER — ONDANSETRON HCL 4 MG/2ML IJ SOLN
4.0000 mg | Freq: Four times a day (QID) | INTRAMUSCULAR | Status: AC | PRN
  Administered 2024-06-19 (×2): 4 mg via INTRAVENOUS
  Filled 2024-06-19 (×2): qty 2

## 2024-06-19 MED ORDER — LIDOCAINE-PRILOCAINE 2.5-2.5 % EX CREA
1.0000 | TOPICAL_CREAM | CUTANEOUS | Status: AC | PRN
Start: 2024-06-19 — End: ?
  Administered 2024-06-20: 1 via TOPICAL
  Filled 2024-06-19: qty 5

## 2024-06-19 MED ORDER — LIDOCAINE HCL (PF) 1 % IJ SOLN: 5.0000 mL | INTRAMUSCULAR | Status: DC | PRN

## 2024-06-19 MED ORDER — NEPRO/CARBSTEADY PO LIQD: 237.0000 mL | ORAL | Status: DC | PRN

## 2024-06-19 MED ORDER — PENTAFLUOROPROP-TETRAFLUOROETH EX AERO: 1.0000 | INHALATION_SPRAY | CUTANEOUS | Status: DC | PRN

## 2024-06-19 MED ORDER — HEPARIN SODIUM (PORCINE) 1000 UNIT/ML DIALYSIS: 1000.0000 [IU] | INTRAMUSCULAR | Status: DC | PRN

## 2024-06-19 MED ORDER — ANTICOAGULANT SODIUM CITRATE 4% (200MG/5ML) IV SOLN: 5.0000 mL | Status: DC | PRN

## 2024-06-19 NOTE — Progress Notes (Signed)
 Bedside report received from RN Tor Ferrier, RN  Pt is alert and awake. Mom is at bedside.

## 2024-06-19 NOTE — Progress Notes (Signed)
 Pt tx to 3w1. Assessment stable. All belongings taken with mom.

## 2024-06-19 NOTE — Progress Notes (Signed)
 Subjective: The patient is more alert.  She is in no apparent distress.  She has the hiccups.  Her mother is at the bedside.  Objective: Vital signs in last 24 hours: Temp:  [96 F (35.6 C)-98.7 F (37.1 C)] 98.7 F (37.1 C) (10/16 0400) Pulse Rate:  [73-93] 93 (10/16 0600) Resp:  [13-31] 22 (10/16 0600) BP: (119-169)/(54-83) 157/75 (10/16 0600) SpO2:  [93 %-99 %] 99 % (10/16 0600) Weight:  [52.9 kg] 52.9 kg (10/15 1044) Estimated body mass index is 20.66 kg/m as calculated from the following:   Height as of this encounter: 5' 3 (1.6 m).   Weight as of this encounter: 52.9 kg.   Intake/Output from previous day: 10/15 0701 - 10/16 0700 In: 1466.9 [I.V.:750; NG/GT:616.9; IV Piggyback:100] Out: 3 [Drains:3] Intake/Output this shift: No intake/output data recorded.  Physical exam the patient is more alert and attentive.  She is speaking some.  She is moving all 4 extremities.  I reviewed over follow-up head CT performed this morning.  There is good position of the proximal shunt with excellent drainage of the right ventricular system and decreased size of her left 3rd and 4th ventricles.  Lab Results: Recent Labs    06/18/24 1937 06/19/24 0556  WBC 6.5 7.5  HGB 9.0* 9.3*  HCT 28.2* 28.6*  PLT 153 169   BMET Recent Labs    06/18/24 1937 06/19/24 0556  NA 132* 134*  K 4.7 4.8  CL 90* 92*  CO2 23 23  GLUCOSE 153* 115*  BUN 67* 79*  CREATININE 4.47* 5.00*  CALCIUM  10.7* 11.3*    Studies/Results: CT HEAD WO CONTRAST Result Date: 06/19/2024 EXAM: CT HEAD WITHOUT CONTRAST 06/19/2024 05:27:00 AM TECHNIQUE: CT of the head was performed without the administration of intravenous contrast. Automated exposure control, iterative reconstruction, and/or weight based adjustment of the mA/kV was utilized to reduce the radiation dose to as low as reasonably achievable. COMPARISON: CT of the head dated 06/11/2024. CLINICAL HISTORY: Hydrocephalus. F/U s/p stroke. FINDINGS: BRAIN  AND VENTRICLES: A right parietal approach ventriculostomy catheter is present, with its tip abutting the septum pellucidum in the roof of the right lateral ventricle. The right lateral ventricle has become nearly completely decompressed, except for radiopaque fluid within the occipital and temporal horns. The left lateral ventricle has become partially decompressed but remains moderately distended. The fourth ventricle is significantly less distended. There is residual intraparenchymal hemorrhage, significantly worse on the right as before. There are encephalomalacia changes again demonstrated within the left parietal lobe and left medial cerebellar hemisphere. There are dystrophic calcifications in the left posterior fossa along the posterior surface of the cerebellar hemisphere, as before. There are extensive vascular calcifications. No acute hemorrhage. No evidence of acute infarct. No extra-axial collection. No mass effect or midline shift. ORBITS: No acute abnormality. SINUSES: No acute abnormality. SOFT TISSUES AND SKULL: No acute soft tissue abnormality. No skull fracture. IMPRESSION: 1. Right parietal approach ventriculostomy catheter in place with tip abutting the septum pellucidum in the roof of the right lateral ventricle. 2. Nearly complete decompression of the right lateral ventricle except for radiopaque fluid within the occipital and temporal horns. Partial decompression of the left lateral ventricle, which remains moderately distended. Significantly less distended fourth ventricle. 3. Residual intraparenchymal hemorrhage, significantly worse on the right as before. 4. Encephalomalacia changes in the left parietal lobe and left medial cerebellar hemisphere. 5. Dystrophic calcifications in the left posterior fossa along the posterior surface of the cerebellar hemisphere, as before. Electronically signed  by: Evalene Coho MD 06/19/2024 05:52 AM EDT RP Workstation: [id number]     Assessment/Plan: Postop day #1: The patient is doing well clinically.  Her CAT scan has improved.  He is okay for transfer to progressive from my point of view.  LOS: 31 days     [name] [name] 06/19/2024, 8:01 AM     Patient ID: [name] [name], female   DOB: September 17, 1980, 43 y.o.   MRN: [id number]

## 2024-06-19 NOTE — Progress Notes (Signed)
 Received patient in bed.Respond ,opened eyes to voice,alert to self.  Access used: Right arm avf that worked well.  Duration of treatment: 3.5 hours.  Net uf of 1.7 L  Hemo comment: Not tolerating prescribed uf ,due to patient's dropping sbp within the prescribed parameter ,plus patient had received PRN blood pressure.  Hand off to the patient's nurse at the bedside with stable vitals.

## 2024-06-19 NOTE — Progress Notes (Signed)
 Patient ID: [name] [name], female   DOB: November 06, 1980, 43 y.o.   MRN: [id number]   Patient is more alert this morning.  Mother is at the bedside.  Patient did have some emesis late yesterday.  Her abdomen is soft with no distention or tenderness.  Her incisions are clean.   Plan: Postop day 1 status post VP shunt insertion.  She is doing well so general surgery will sign off for now.  Please call us  back if needed

## 2024-06-19 NOTE — Progress Notes (Signed)
 PHARMACY NOTE:  ANTIMICROBIAL RENAL DOSAGE ADJUSTMENT  Current antimicrobial regimen includes a mismatch between antimicrobial dosage and estimated renal function.  As per policy approved by the Pharmacy & Therapeutics and Medical Executive Committees, the antimicrobial dosage will be adjusted accordingly.  Current antimicrobial dosage:  cefazolin  2g q8hr   Indication: surgical prophylaxis  Renal Function:  Estimated Creatinine Clearance: 12 mL/min (A) (by C-G formula based on SCr of 5 mg/dL (H)). [x]      On intermittent HD, scheduled:MWF []      On CRRT    Antimicrobial dosage has been changed to:  already received 2g q8 x4 doses, hold further doses d/t renal fx   Thank you for allowing pharmacy to be a part of this patient's care.  Jinnie JONETTA Door, [facility] 06/19/2024 4:08 PM

## 2024-06-19 NOTE — Progress Notes (Signed)
 Patient ID: [name] [name], female   DOB: 1980-09-30, 43 y.o.   MRN: [id number] [facility] Progress Note   Subjective:   OR yesterday went well w/ placement of VP shunt Ventriculostomy has been removed Per mother pt is communicating w/ her     Objective:   BP (!) 128/54   Pulse (!) 116   Temp 98.6 F (37 C)   Resp (!) 28   Ht 5' 3 (1.6 m)   Wt 45 kg   SpO2 99%   BMI 17.57 kg/m   Physical Exam: Gen: resting in bed, drain out, head bandage on R side CVS: Pulse regular tachycardia, S1 and S2 normal Resp: Anteriorly clear to auscultation, no rales/rhonchi Abd: Soft, flat, nontender, bowel sounds normal Ext: No lower extremity edema RUA AVF+bruit   OP HD: NW MWF 3h  B400  55.2kg  2K bath   AVF  Heparin  none Mircera 150 mcg q 2 wks, due 9/24   Assessment/ Plan Acute right intracranial hemorrhage: Associated with uncontrolled hypertension and status post ventriculostomy.  Extubated 9/22. S/P VP shunt yesterday, 10/15. Stable. ESRD/ hx of renal allograft failure: usual HD MWF but this week will be off schedule TTS. HD today.  Volume: wt's are up this week, 50- 53kg, had been high 40s to low 50s. Max UF w/ HD today.  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 Nutrition: due to ^K+, tube feeds were changed to Nepro Anemia of esrd: Hb 9-10 range. Cont darbe 100 mcg weekly.  Sec hyperparathyroidism: Repeat PTH 124, stable. 1, 25 vit D was low at 8 (24- 80), and vit D 25 wnl at 37 (30-100).  Ca high suspected due to immobility --> gave 1 dose of pamidronate 10/12 Hypertension: BP's were very high, then dropped low. Now is on norvasc , clonidine  and coreg  on non-hd days only. BP's sig better.  Dispo: per pt's mother, pt is to be full scope care, full code.    Myer Fret  MD  [facility] 06/19/2024, 12:05 PM  Recent Labs  Lab  06/18/24 0353 06/18/24 1937 06/19/24 0556  HGB 9.3* 9.0* 9.3*  CALCIUM  11.0* 10.7* 11.3*  PHOS 5.1*  --  7.1*  CREATININE 3.56* 4.47* 5.00*  K 4.9 4.7 4.8    Inpatient medications:  [START ON 06/20/2024] amLODipine   10 mg Per Tube Once per day on Sunday Monday Wednesday Friday   carvedilol   25 mg Per Tube 2 times per day on Sunday Monday Wednesday Friday   Chlorhexidine  Gluconate Cloth  6 each Topical Q0600   Chlorhexidine  Gluconate Cloth  6 each Topical Q0600   [START ON 06/20/2024] cloNIDine   0.3 mg Per Tube Once per day on Sunday Monday Wednesday Friday   cycloSPORINE   200 mg Per Tube QPM   cycloSPORINE   225 mg Per Tube Daily   darbepoetin (ARANESP ) injection - DIALYSIS  100 mcg Subcutaneous Q Wed-1800   fiber  1 packet Per Tube TID   hydrALAZINE   100 mg Per Tube 3 times per day on Sunday Monday Wednesday Friday   insulin  aspart  0-9 Units Subcutaneous Q4H   lacosamide   100 mg Per Tube BID   multivitamin with minerals  1 tablet Per Tube Daily   mycophenolate   500 mg Per Tube BID   mouth rinse  15 mL Mouth Rinse 4 times per day   oxidized cellulose  1 each Topical Once  pantoprazole  (PROTONIX ) IV  40 mg Intravenous QHS   predniSONE   15 mg Per Tube Q breakfast    anticoagulant sodium citrate      anticoagulant sodium citrate      anticoagulant sodium citrate       ceFAZolin  (ANCEF ) IV Stopped (06/19/24 9357)   feeding supplement (NEPRO CARB STEADY) 55 mL/hr at 06/19/24 1100   acetaminophen  **OR** acetaminophen  (TYLENOL ) oral liquid 160 mg/5 mL **OR** acetaminophen , alteplase , anticoagulant sodium citrate , anticoagulant sodium citrate , anticoagulant sodium citrate , feeding supplement (NEPRO CARB STEADY), feeding supplement (NEPRO CARB STEADY), heparin , heparin , heparin , labetalol , lidocaine  (PF), lidocaine  (PF), lidocaine  (PF), lidocaine -prilocaine , lidocaine -prilocaine , lidocaine -prilocaine , midodrine , mouth rinse, pentafluoroprop-tetrafluoroeth, pentafluoroprop-tetrafluoroeth,  pentafluoroprop-tetrafluoroeth, sennosides

## 2024-06-19 NOTE — Plan of Care (Signed)
 Palliative:  Chart reviewed. Doing well post-op. Goals have remained clear for full code, full scope.   No charge  Bernarda Kitty, NP Palliative Medicine Team Pager [phone] (Please see [email or web address] for schedule) Team Phone [phone]

## 2024-06-19 NOTE — Progress Notes (Signed)
 NAME:  [name] [name], MRN:  [id number], DOB:  01-09-1981, LOS: 31 ADMISSION DATE:  05/19/2024 CONSULTATION DATE:  9/15/20025 REFERRING MD: Patsey - EDP, CHIEF COMPLAINT:  Code Stroke, ICH   History of Present Illness:  43 year old woman who presented to [facility] ED 9/15 as a Code Stroke. 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 Recent admissions 6/11-7/28 and 7/29-8/13 for ICH.  Patient presented to [facility] ED from dialysis as a Code Stroke. CT Head demonstrated large volume IVH with moderate hydrocephalus, acute hemorrhage of medial R temporal lobe and small foci of residual/recurrent hemorrhage in the L cerebellar hemisphere.   Patient was seen by stroke team and neurosurgery, EVD was placed on left side, PCCM was consulted for medical evaluation and admission to neuro ICU  Pertinent Medical History:   Past Medical History:  Diagnosis Date   Anemia    Anxiety    DM (diabetes mellitus), type 1 (HCC)    ESRD on hemodialysis (HCC)    Gastroparesis    History of simultaneous kidney and pancreas transplant (HCC) 2014   Hypertension    Ischemic cerebrovascular accident (CVA) (HCC) 11/2021   Narcotic abuse (HCC)    Non compliance with medical treatment    Stroke [facility])    Vision loss    Significant Hospital Events: Including procedures, antibiotic start and stop dates in addition to other pertinent events   9/15 admitted with ICH plus IVH, EVD was placed for hydrocephalus, intubated 9/16 remain on clevidipine  infusion, EVD is draining bloody fluid 9/17 continue to require clevidipine  infusion, tolerated spontaneous breathing trial 9/19: this am, dialysis ongoing. Neurosx plans to continue ventriculostomy for now. Remains on cleviprex  to maintain BP goal. Tmax 100.1. despite plt decrease 4T score 0. Will maintain heparin  as is for  now and closely follow trend.  9/22 palliative care meeting.  Family is insistent that they want full scope of care 9/23 Extubated 9/27 Spiking low grade fevers, wheezing on exam, CXR normal  9/30 No tolerating when EVD is raised. Still bloody fluid. CT head will be done. 10/1 -developed fevers pancultures drawn and broad-spectrum antibiotics started 10/2 became afebrile after starting antibiotics, did not tolerate raising EVD 10/3 serum potassium 6.5, receiving hemodialysis, no overnight issues.  Did not tolerate weaning EVD 10/4 EVD remain at 10, no issues 10/5 EVD remains at 10, afebrile 10/6 EVD was raised to 30 cm water , tolerating well, remained afebrile 10/7 EVD is at 30 cm water , CT scan will be done tomorrow morning, no overnight issues 10/8 she became somnolent, CT head showed increasing hydrocephalus, EVD was titrated down to 10 cm of water  with improvement in mental status 10/13 VP shunt placement scheduled for today postponed to Wednesday 10/14 no overnight issues, received HD 10/15 VP shunt placement  Interim History / Subjective:   Had her VP shunt placed yesterday without any issues.  Stable overnight  Objective:  Blood pressure (!) 179/69, pulse 93, temperature 98.7 F (37.1 C), temperature source Axillary, resp. rate (!) 22, height 5' 3 (1.6 m), weight 52.9 kg, SpO2 99%.        Intake/Output Summary (Last 24 hours) at 06/19/2024 0942 Last data filed at 06/19/2024 0600 Gross per 24 hour  Intake 1466.91 ml  Output --  Net 1466.91 ml   Filed Weights   06/17/24 0800 06/17/24 1244 06/18/24 1044  Weight: 55.6 kg 52.9 kg 52.9 kg  Physical Examination: Gen:      No acute distress, chronically ill-appearing HEENT:  EOMI, sclera anicteric Neck:     No masses; no thyromegaly Lungs:    Clear to auscultation bilaterally; normal respiratory effort CV:         Regular rate and rhythm; no murmurs Abd:      PEG tube Ext:    No edema; adequate peripheral  perfusion Neuro: Somnolent, arousable  Lab/imaging reviewed Significant for sodium 134, BUN/creatinine 79/5.00 Hemoglobin 9.3   Resolved Hospital problems  Hypertensive emergency IV Acute respiratory insufficiency Thrombocytopenia of critical illness Serratia/klebsiella pneumonia Hyperkalemia Acute encephalopathy in the setting of ICH and hydrocephalus Narcotic abuse  Assessment & Plan:  Acute right parietotemporal intraparenchymal hemorrhage with IVH with ICH score 2 due to uncontrolled hypertension Obstructive hydrocephalus status post EVD Seizure disorder Has not tolerated EVD wean.  - Has a VP shunt and EVD is out - Continue Vimpat  - Continue neuro monitoring  End-stage renal disease on hemodialysis with failed renal transplant but functional pancreatic transplant on immunosuppressive therapy Hypervolemic hyponatremia, hypochloremia, hypercalcemia - Continue dialysis per nephrology - Continue home immunosuppression with cyclosporine , mycophenolate , prednisone   HTN - Continue Norvasc , clonidine , Coreg , hydralazine  on non HD days only  Anemia of chronic disease No active signs of bleeding. Hgb stable. - Continue to monitor CBC  Type 1 diabetes, complicated with vasculopathy and gastroparesis Not on long term insulin  anymore post pancreatic transplant. - Monitor blood sugars, continue sliding scale insulin  - Continue home immunosuppression with cyclosporine , mycophenolate , prednisone   Moderate protein calorie malnutrition - Cortrak in place, continue tube feeds  Stage 1 pressure ulcer POA - Continue wound care  Stable for transfer out of ICU and to hospitalist service.  Signature:   Lonna Coder, MD [facility] 06/19/24 9:42 AM  Please see [email or web address] for pager details.  From 7A-7P if no response, please call [phone] After hours, please call ELink [phone]

## 2024-06-20 DIAGNOSIS — I619 Nontraumatic intracerebral hemorrhage, unspecified: Secondary | ICD-10-CM | POA: Diagnosis not present

## 2024-06-20 LAB — CBC
HCT: 27.2 % — ABNORMAL LOW (ref 36.0–46.0)
Hemoglobin: 8.9 g/dL — ABNORMAL LOW (ref 12.0–15.0)
MCH: 31.7 pg (ref 26.0–34.0)
MCHC: 32.7 g/dL (ref 30.0–36.0)
MCV: 96.8 fL (ref 80.0–100.0)
Platelets: 144 K/uL — ABNORMAL LOW (ref 150–400)
RBC: 2.81 MIL/uL — ABNORMAL LOW (ref 3.87–5.11)
RDW: 15.3 % (ref 11.5–15.5)
WBC: 5.1 K/uL (ref 4.0–10.5)
nRBC: 0 % (ref 0.0–0.2)

## 2024-06-20 LAB — GLUCOSE, CAPILLARY
Glucose-Capillary: 150 mg/dL — ABNORMAL HIGH (ref 70–99)
Glucose-Capillary: 147 mg/dL — ABNORMAL HIGH (ref 70–99)
Glucose-Capillary: 173 mg/dL — ABNORMAL HIGH (ref 70–99)
Glucose-Capillary: 110 mg/dL — ABNORMAL HIGH (ref 70–99)
Glucose-Capillary: 128 mg/dL — ABNORMAL HIGH (ref 70–99)

## 2024-06-20 LAB — BASIC METABOLIC PANEL WITH GFR
Anion gap: 19 — ABNORMAL HIGH (ref 5–15)
BUN: 44 mg/dL — ABNORMAL HIGH (ref 6–20)
CO2: 26 mmol/L (ref 22–32)
Calcium: 11 mg/dL — ABNORMAL HIGH (ref 8.9–10.3)
Chloride: 90 mmol/L — ABNORMAL LOW (ref 98–111)
Creatinine, Ser: 3.24 mg/dL — ABNORMAL HIGH (ref 0.44–1.00)
GFR, Estimated: 18 mL/min — ABNORMAL LOW (ref >60)
Glucose, Bld: 135 mg/dL — ABNORMAL HIGH (ref 70–99)
Potassium: 3.7 mmol/L (ref 3.5–5.1)
Sodium: 135 mmol/L (ref 135–145)

## 2024-06-20 LAB — PHOSPHORUS: Phosphorus: 4.8 mg/dL — ABNORMAL HIGH (ref 2.5–4.6)

## 2024-06-20 MED ORDER — SODIUM CHLORIDE 0.9 % IV BOLUS
250.0000 mL | INTRAVENOUS | Status: AC
  Administered 2024-06-21: 250 mL via INTRAVENOUS

## 2024-06-20 MED ORDER — CHLORHEXIDINE GLUCONATE CLOTH 2 % EX PADS
6.0000 | MEDICATED_PAD | Freq: Every day | CUTANEOUS | Status: DC
  Administered 2024-06-20 – 2024-06-22 (×3): 6 via TOPICAL

## 2024-06-20 MED ORDER — MIDODRINE HCL 5 MG PO TABS
10.0000 mg | ORAL_TABLET | ORAL | Status: AC
  Administered 2024-06-20: 10 mg via ORAL
  Filled 2024-06-20: qty 2

## 2024-06-20 NOTE — Procedures (Signed)
 HD Note:  Some information was entered later than the data was gathered due to patient care needs. The stated time with the data is accurate.  Received patient in bed to unit.   Alert and oriented.   Informed consent signed and in chart.   Access used: AVF upper right arm Access issues: None  Patient SBP went below the ordered limit of 100.  NS 200 ml was given to sustain ordered limit.  Tablo transfer of information into the EMR was inconsistent.  Any missing information for this machine is a result of this technical issue.  TX duration: 3.5 hour  Alert, without acute distress.  Total UF removed: 900 ml total.  Hand-off given to patient's nurse.   Transported back to the room   Dom Haverland L. Lenon, RN Kidney Dialysis Unit.

## 2024-06-20 NOTE — Plan of Care (Signed)
 Palliative:  Palliative has been following peripherally. Goals have remained clear for full scope. Noted VP shunt has been completed with signs of improvement post-op. She has moved out of ICU. Palliative will sign off at this time. We will be happy to re-engage for any changes or decline or if patient/family request. Thank you.   No charge  Bernarda Kitty, NP Palliative Medicine Team Pager [phone] (Please see [email or web address] for schedule) Team Phone [phone]

## 2024-06-20 NOTE — Progress Notes (Addendum)
 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  CT head showed large volume intraventricular hemorrhage with moderate hydrocephalus, acute hemorrhage of medial right temporal lobe and small foci of residual/recurrent hemorrhage in the L cerebellar hemisphere.  She was recently discharged from here in August after she was managed for ischemic/hemorrhagic stroke and was discharged to LTAC.  Neurology, neurosurgery, PCCM consulted.  EVD was placed on the left side.  Patient admitted to neuro ICU.  She was intubated on admission.  She had prolonged ICU stay with EVD drain which was changed to VP shunt on 10/15.  Extubated on 9/23.  Transferred to [facility] service on 10/17.  PT/OT consulted  Important events( as per PCCM note):  9/15 admitted with ICH plus IVH, EVD was placed for hydrocephalus, intubated 9/16 remain on clevidipine  infusion, EVD is draining bloody fluid 9/17 continue to require clevidipine  infusion, tolerated spontaneous breathing trial 9/19: this am, dialysis ongoing. Neurosx plans to continue ventriculostomy for now. Remains on cleviprex  to maintain BP goal. Tmax 100.1. despite plt decrease 4T score 0. Will maintain heparin  as is for now and closely follow trend.  9/22 palliative care meeting.  Family is insistent that they want full scope of care 9/23 Extubated 9/27 Spiking low grade fevers, wheezing on exam, CXR normal  9/30 No tolerating when EVD is raised. Still bloody fluid. CT head will be done. 10/1 -developed fevers pancultures drawn and broad-spectrum antibiotics started 10/2 became afebrile after starting  antibiotics, did not tolerate raising EVD 10/3 serum potassium 6.5, receiving hemodialysis, no overnight issues.  Did not tolerate weaning EVD 10/4 EVD remain at 10, no issues 10/5 EVD remains at 10, afebrile 10/6 EVD was raised to 30 cm water , tolerating well, remained afebrile 10/7 EVD is at 30 cm water , CT scan will be done tomorrow morning, no overnight issues 10/8 she became somnolent, CT head showed increasing hydrocephalus, EVD was titrated down to 10 cm of water  with improvement in mental status 10/13 VP shunt placement scheduled for today postponed to Wednesday 10/14 no overnight issues, received HD 10/15 VP shunt placement     Assessment & Plan:  Principal Problem:   Hemorrhagic stroke [facility]) Active Problems:   Moderate protein-calorie malnutrition   Pressure injury of skin   Nontraumatic subcortical hemorrhage of right cerebral hemisphere [facility])  Acute right parietotemporal intraparenchymal hemorrhage/intraventricular hemorrhage due to uncontrolled hypertension/obstructive hydrocephalus status post EVD changed to VP shunt/seizure disorder: Neurosurgery, neurology where following.  EVD changed to VP shunt on 10/15.  On Vimpat .  ESRD on dialysis/history of failed renal transplant/secondary hyperparathyroidism: Nephrology following.  Dialyzed on MWF schedule.  Has AV fistula on the right upper extremity  Acute hypoxic respiratory failure: Intubated on admission, extubated on 9/23.  Currently on room air  Intermittent low-grade fever: Likely from intracranial hemorrhage.  Previous blood cultures showed Staphylococcus capitis, Staphylococcus epidermidis most likely contamination.  Respiratory culture showed serratia, Klebsiella .she completed antibiotics course.  Repeat cultures have been negative so far.    History of pancreatic transplantation: On cyclosporine , mycophenolate , prednisone   Uncontrolled hypertension: On Norvasc , clonidine , Coreg , hydralazine  on non-HD days  only.  Anemia of chronic disease: No signs of active bleeding.  Hemoglobin stable  Type 1 diabetes with vasculopathy/gastroparesis: Not on long-term insulin  anymore due to pancreatic  transplant.  Monitor blood sugars.  On sliding scale.  Moderate protein calorie malnutrition: Has PEG tube.  Continue tube feeds  Stage I sacral pressure ulcer present on admission: Continue wound care  Debility/deconditioning/disposition/goals of care: Young patient with multiple comorbidities.  Palliative care following for also goals of care and discussed with family.  Remains full code.  May end up with prolonged hospitalization.  May need to go back to LTAC once she is ready.  PT/OT consulted    Nutrition Problem: Moderate Malnutrition Etiology: chronic illness (ESRD on HD/DM and gastroparesis) Wound 05/19/24 1933 Pressure Injury Buttocks Bilateral Stage 1 -  Intact skin with non-blanchable redness of a localized area usually over a bony prominence. (Active)    DVT prophylaxis:SCDs Start: 05/19/24 1724     Code Status: Full Code  Family Communication: Mother at bedside on 10/17  Patient status:Inpatient  Patient is from : LTAC  Anticipated discharge to:not sure/LTAC  Estimated DC date:not sure   Consultants: PCCM, neurosurgery, general surgery, neurology  Procedures: EVD placement, intubation, VP shunt placement  Antimicrobials:  Anti-infectives (From admission, onward)    Start     Dose/Rate Route Frequency Ordered Stop   06/18/24 2030  ceFAZolin  (ANCEF ) IVPB 2g/100 mL premix  Status:  Discontinued        2 g 200 mL/hr over 30 Minutes Intravenous Every 8 hours 06/18/24 1454 06/19/24 1608   06/18/24 0600  ceFAZolin  (ANCEF ) IVPB 2g/100 mL premix        2 g 200 mL/hr over 30 Minutes Intravenous On call to O.R. 06/15/24 1152 06/18/24 1230   06/06/24 1400  piperacillin -tazobactam (ZOSYN ) IVPB 2.25 g        2.25 g 100 mL/hr over 30 Minutes Intravenous Every 8 hours 06/06/24 1017  06/09/24 0538   06/06/24 1200  vancomycin  (VANCOREADY) IVPB 500 mg/100 mL        500 mg 100 mL/hr over 60 Minutes Intravenous Every M-W-F (Hemodialysis) 06/04/24 1745 06/06/24 1900   06/04/24 1845  vancomycin  (VANCOCIN ) IVPB 1000 mg/200 mL premix        1,000 mg 200 mL/hr over 60 Minutes Intravenous  Once 06/04/24 1745 06/04/24 1915   06/04/24 1830  ceFEPIme  (MAXIPIME ) 2 g in sodium chloride  0.9 % 100 mL IVPB  Status:  Discontinued        2 g 200 mL/hr over 30 Minutes Intravenous Every M-W-F (Hemodialysis) 06/04/24 1743 06/06/24 1017   05/27/24 1015  cefTRIAXone  (ROCEPHIN ) 2 g in sodium chloride  0.9 % 100 mL IVPB        2 g 200 mL/hr over 30 Minutes Intravenous Every 24 hours 05/27/24 0918 05/29/24 1014   05/23/24 1530  ceFEPIme  (MAXIPIME ) 1 g in sodium chloride  0.9 % 100 mL IVPB  Status:  Discontinued        1 g 200 mL/hr over 30 Minutes Intravenous Every 24 hours 05/23/24 1433 05/27/24 0918   05/22/24 2200  Ampicillin -Sulbactam (UNASYN ) 3 g in sodium chloride  0.9 % 100 mL IVPB  Status:  Discontinued        3 g 200 mL/hr over 30 Minutes Intravenous Every 24 hours 05/22/24 0834 05/23/24 1433   05/22/24 0930  Ampicillin -Sulbactam (UNASYN ) 3 g in sodium chloride  0.9 % 100 mL IVPB        3 g 200 mL/hr over 30 Minutes Intravenous  Once 05/22/24 0834 05/22/24 1900       Subjective: Patient seen  and examined at the bedside today.  Hemodynamically stable.  Lying comfortably on bed.  Being cleaned.  Mother at bedside.  Not in any apparent distress.  No new complaints this morning.  Follows commands, speaks but very weak and deconditioned  Objective: Vitals:   06/19/24 2052 06/19/24 2330 06/20/24 0436 06/20/24 0450  BP: (!) 155/80 (!) 175/79 (!) 149/64   Pulse: 97 94 97   Resp: 18 18 18    Temp: 99.1 F (37.3 C) 99.3 F (37.4 C) (!) 100.9 F (38.3 C) 99.8 F (37.7 C)  TempSrc: Oral Oral Oral Oral  SpO2: 93% 94% 96%   Weight:    51.6 kg  Height:        Intake/Output Summary (Last  24 hours) at 06/20/2024 0754 Last data filed at 06/19/2024 2000 Gross per 24 hour  Intake 725 ml  Output 1700 ml  Net -975 ml   Filed Weights   06/19/24 0830 06/19/24 1311 06/20/24 0450  Weight: 53 kg 51.8 kg 51.6 kg    Examination:  General exam: Overall comfortable, not in distress, chronically deconditioned, chronically ill looking HEENT: PERRL Respiratory system:  no wheezes or crackles  Cardiovascular system: S1 & S2 heard, RRR.  Gastrointestinal system: Abdomen is nondistended, soft and nontender.  PEG tube Central nervous system: Alert and awake, obeys commands Extremities: No edema, no clubbing ,no cyanosis,AV fistula  on the right upper extremity Skin: No rashes, no icterus     Data Reviewed: I have personally reviewed following labs and imaging studies  CBC: Recent Labs  Lab 06/17/24 0327 06/18/24 0353 06/18/24 1937 06/19/24 0556 06/20/24 0526  WBC 4.2 4.0 6.5 7.5 5.1  HGB 9.3* 9.3* 9.0* 9.3* 8.9*  HCT 28.0* 29.2* 28.2* 28.6* 27.2*  MCV 95.6 98.3 98.9 97.6 96.8  PLT 164 177 153 169 144*   Basic Metabolic Panel: Recent Labs  Lab 06/17/24 0327 06/18/24 0353 06/18/24 1937 06/19/24 0556 06/20/24 0526  NA 133* 131* 132* 134* 135  K 5.2* 4.9 4.7 4.8 3.7  CL 88* 87* 90* 92* 90*  CO2 24 26 23 23 26   GLUCOSE 147* 106* 153* 115* 135*  BUN 145* 57* 67* 79* 44*  CREATININE 6.99* 3.56* 4.47* 5.00* 3.24*  CALCIUM  11.9* 11.0* 10.7* 11.3* 11.0*  MG  --  3.1*  --   --   --   PHOS  --  5.1*  --  7.1* 4.8*     Recent Results (from the past 240 hours)  CSF culture w Gram Stain     Status: None   Collection Time: 06/11/24  9:20 AM   Specimen: CSF; Cerebrospinal Fluid  Result Value Ref Range Status   Specimen Description CSF  Final   Special Requests LP  Final   Gram Stain   Final    RARE WBC PRESENT,BOTH PMN AND MONONUCLEAR NO ORGANISMS SEEN    Culture   Final    NO GROWTH 3 DAYS Performed at [facility], [address], [place], [name]  [address]    Report Status 06/14/2024 FINAL  Final     Radiology Studies: CT HEAD WO CONTRAST Result Date: 06/19/2024 EXAM: CT HEAD WITHOUT CONTRAST 06/19/2024 05:27:00 AM TECHNIQUE: CT of the head was performed without the administration of intravenous contrast. Automated exposure control, iterative reconstruction, and/or weight based adjustment of the mA/kV was utilized to reduce the radiation dose to as low as reasonably achievable. COMPARISON: CT of the head dated 06/11/2024. CLINICAL HISTORY: Hydrocephalus. F/U s/p stroke. FINDINGS:  BRAIN AND VENTRICLES: A right parietal approach ventriculostomy catheter is present, with its tip abutting the septum pellucidum in the roof of the right lateral ventricle. The right lateral ventricle has become nearly completely decompressed, except for radiopaque fluid within the occipital and temporal horns. The left lateral ventricle has become partially decompressed but remains moderately distended. The fourth ventricle is significantly less distended. There is residual intraparenchymal hemorrhage, significantly worse on the right as before. There are encephalomalacia changes again demonstrated within the left parietal lobe and left medial cerebellar hemisphere. There are dystrophic calcifications in the left posterior fossa along the posterior surface of the cerebellar hemisphere, as before. There are extensive vascular calcifications. No acute hemorrhage. No evidence of acute infarct. No extra-axial collection. No mass effect or midline shift. ORBITS: No acute abnormality. SINUSES: No acute abnormality. SOFT TISSUES AND SKULL: No acute soft tissue abnormality. No skull fracture. IMPRESSION: 1. Right parietal approach ventriculostomy catheter in place with tip abutting the septum pellucidum in the roof of the right lateral ventricle. 2. Nearly complete decompression of the right lateral ventricle except for radiopaque fluid within the occipital and temporal horns. Partial  decompression of the left lateral ventricle, which remains moderately distended. Significantly less distended fourth ventricle. 3. Residual intraparenchymal hemorrhage, significantly worse on the right as before. 4. Encephalomalacia changes in the left parietal lobe and left medial cerebellar hemisphere. 5. Dystrophic calcifications in the left posterior fossa along the posterior surface of the cerebellar hemisphere, as before. Electronically signed by: Evalene Coho MD 06/19/2024 05:52 AM EDT RP Workstation: [id number]    Scheduled Meds:  amLODipine   10 mg Per Tube Once per day on Sunday Monday Wednesday Friday   carvedilol   25 mg Per Tube 2 times per day on Sunday Monday Wednesday Friday   Chlorhexidine  Gluconate Cloth  6 each Topical Q0600   cloNIDine   0.3 mg Per Tube Once per day on Sunday Monday Wednesday Friday   cycloSPORINE   200 mg Per Tube QPM   cycloSPORINE   225 mg Per Tube Daily   darbepoetin (ARANESP ) injection - DIALYSIS  100 mcg Subcutaneous Q Wed-1800   fiber  1 packet Per Tube TID   hydrALAZINE   100 mg Per Tube 3 times per day on Sunday Monday Wednesday Friday   insulin  aspart  0-9 Units Subcutaneous Q4H   lacosamide   100 mg Per Tube BID   multivitamin with minerals  1 tablet Per Tube Daily   mycophenolate   500 mg Per Tube BID   mouth rinse  15 mL Mouth Rinse 4 times per day   oxidized cellulose  1 each Topical Once   pantoprazole  (PROTONIX ) IV  40 mg Intravenous QHS   predniSONE   15 mg Per Tube Q breakfast   Continuous Infusions:  anticoagulant sodium citrate      feeding supplement (NEPRO CARB STEADY) 1,000 mL (06/19/24 1710)     LOS: 32 days   Ivonne Mustache, MD [name] P10/17/2025, 7:54 AM

## 2024-06-20 NOTE — Progress Notes (Signed)
 Patient ID: [name], female   DOB: 1981/06/20, 43 y.o.   MRN: [id number] [facility] Progress Note   Subjective:   Moved out to the floor yesterday No c/os' today    Objective:   BP (!) 128/54   Pulse (!) 116   Temp 98.6 F (37 C)   Resp (!) 28   Ht 5' 3 (1.6 m)   Wt 45 kg   SpO2 99%   BMI 17.57 kg/m   Physical Exam: Gen: resting in bed, drain out, head bandage on R side CVS: Pulse regular tachycardia, S1 and S2 normal Resp: Anteriorly clear to auscultation, no rales/rhonchi Abd: Soft, flat, nontender, bowel sounds normal Ext: No lower extremity edema RUA AVF+bruit   OP HD: NW MWF 3h  B400  55.2kg  2K bath   AVF  Heparin  none Mircera 150 mcg q 2 wks, due 9/24   Assessment/ Plan Acute right intracranial hemorrhage: Associated with uncontrolled hypertension. S/P ventriculostomy.  Extubated 9/22. Underwent VP shunt 10/15. Improving MS. 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 HD today Volume: wt's are up this week, 50- 53kg, had been high 40s to low 50s. Max UF w/ HD today.  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 Nutrition: due to ^K+, tube feeds were changed to Nepro Anemia of esrd: Hb 9-10 range. Cont darbe 100 mcg weekly.  Sec hyperparathyroidism: Repeat PTH 124, stable. 1, 25 vit D was low at 8 (24- 80), and vit D 25 wnl at 37 (30-100).  Ca high suspected due to immobility --> gave 1 dose of pamidronate 10/12 Hypertension: BP's were very high, then dropped low. Now is on norvasc , clonidine  and coreg  on non-hd days only. BP's sig better.  Dispo: per pt's mother, pt is to be full scope care, full code.    Myer Fret  MD  [facility] 06/20/2024, 11:26 AM  Recent Labs  Lab 06/19/24 0556 06/20/24 0526  HGB 9.3* 8.9*  CALCIUM  11.3* 11.0*  PHOS 7.1* 4.8*  CREATININE 5.00* 3.24*  K 4.8 3.7     Inpatient medications:  amLODipine   10 mg Per Tube Once per day on Sunday Monday Wednesday Friday   carvedilol   25 mg Per Tube 2 times per day on Sunday Monday Wednesday Friday   Chlorhexidine  Gluconate Cloth  6 each Topical Q0600   cloNIDine   0.3 mg Per Tube Once per day on Sunday Monday Wednesday Friday   cycloSPORINE   200 mg Per Tube QPM   cycloSPORINE   225 mg Per Tube Daily   darbepoetin (ARANESP ) injection - DIALYSIS  100 mcg Subcutaneous Q Wed-1800   fiber  1 packet Per Tube TID   hydrALAZINE   100 mg Per Tube 3 times per day on Sunday Monday Wednesday Friday   insulin  aspart  0-9 Units Subcutaneous Q4H   lacosamide   100 mg Per Tube BID   multivitamin with minerals  1 tablet Per Tube Daily   mycophenolate   500 mg Per Tube BID   mouth rinse  15 mL Mouth Rinse 4 times per day   oxidized cellulose  1 each Topical Once   pantoprazole  (PROTONIX ) IV  40 mg Intravenous QHS   predniSONE   15 mg Per Tube Q breakfast    anticoagulant sodium citrate      feeding supplement (NEPRO CARB STEADY) 1,000 mL (06/19/24 1710)   acetaminophen  **OR** acetaminophen  (TYLENOL ) oral liquid 160 mg/5  mL **OR** acetaminophen , alteplase , anticoagulant sodium citrate , feeding supplement (NEPRO CARB STEADY), feeding supplement (NEPRO CARB STEADY), heparin , labetalol , lidocaine  (PF), lidocaine -prilocaine , midodrine , mouth rinse, pentafluoroprop-tetrafluoroeth, sennosides

## 2024-06-20 NOTE — Plan of Care (Signed)
  Problem: Coping: Goal: Ability to adjust to condition or change in health will improve Outcome: Progressing   Problem: Education: Goal: Individualized Educational Video(s) Outcome: Progressing   Problem: Fluid Volume: Goal: Ability to maintain a balanced intake and output will improve Outcome: Progressing   Problem: Health Behavior/Discharge Planning: Goal: Ability to manage health-related needs will improve Outcome: Progressing

## 2024-06-20 NOTE — Plan of Care (Signed)
  Problem: Education: Goal: Ability to describe self-care measures that may prevent or decrease complications (Diabetes Survival Skills Education) will improve 06/20/2024 0451 by Daniel Cipriano RAMAN, RN Outcome: Progressing 06/20/2024 0450 by Daniel Cipriano RAMAN, RN Outcome: Progressing

## 2024-06-21 DIAGNOSIS — I619 Nontraumatic intracerebral hemorrhage, unspecified: Secondary | ICD-10-CM | POA: Diagnosis not present

## 2024-06-21 LAB — PHOSPHORUS: Phosphorus: 3.6 mg/dL (ref 2.5–4.6)

## 2024-06-21 LAB — BASIC METABOLIC PANEL WITH GFR
Anion gap: 17 — ABNORMAL HIGH (ref 5–15)
BUN: 30 mg/dL — ABNORMAL HIGH (ref 6–20)
CO2: 26 mmol/L (ref 22–32)
Calcium: 10.7 mg/dL — ABNORMAL HIGH (ref 8.9–10.3)
Chloride: 90 mmol/L — ABNORMAL LOW (ref 98–111)
Creatinine, Ser: 2.24 mg/dL — ABNORMAL HIGH (ref 0.44–1.00)
GFR, Estimated: 27 mL/min — ABNORMAL LOW (ref >60)
Glucose, Bld: 140 mg/dL — ABNORMAL HIGH (ref 70–99)
Potassium: 3.6 mmol/L (ref 3.5–5.1)
Sodium: 133 mmol/L — ABNORMAL LOW (ref 135–145)

## 2024-06-21 LAB — CBC
HCT: 29.7 % — ABNORMAL LOW (ref 36.0–46.0)
Hemoglobin: 9.6 g/dL — ABNORMAL LOW (ref 12.0–15.0)
MCH: 31.7 pg (ref 26.0–34.0)
MCHC: 32.3 g/dL (ref 30.0–36.0)
MCV: 98 fL (ref 80.0–100.0)
Platelets: 167 K/uL (ref 150–400)
RBC: 3.03 MIL/uL — ABNORMAL LOW (ref 3.87–5.11)
RDW: 15.1 % (ref 11.5–15.5)
WBC: 5.3 K/uL (ref 4.0–10.5)
nRBC: 0 % (ref 0.0–0.2)

## 2024-06-21 LAB — GLUCOSE, CAPILLARY
Glucose-Capillary: 125 mg/dL — ABNORMAL HIGH (ref 70–99)
Glucose-Capillary: 138 mg/dL — ABNORMAL HIGH (ref 70–99)
Glucose-Capillary: 149 mg/dL — ABNORMAL HIGH (ref 70–99)
Glucose-Capillary: 203 mg/dL — ABNORMAL HIGH (ref 70–99)
Glucose-Capillary: 148 mg/dL — ABNORMAL HIGH (ref 70–99)

## 2024-06-21 MED ORDER — PHENOL 1.4 % MT LIQD
1.0000 | OROMUCOSAL | Status: DC | PRN
  Administered 2024-06-25 – 2024-07-04 (×2): 1 via OROMUCOSAL
  Filled 2024-06-21 (×2): qty 177

## 2024-06-21 NOTE — Plan of Care (Signed)
 General condition seems fair, able to verbalize pain and was cooperative.  Problem: Coping: Goal: Ability to adjust to condition or change in health will improve Outcome: Progressing   Problem: Fluid Volume: Goal: Ability to maintain a balanced intake and output will improve Outcome: Progressing   Problem: Nutritional: Goal: Maintenance of adequate nutrition will improve Outcome: Progressing   Problem: Skin Integrity: Goal: Risk for impaired skin integrity will decrease Outcome: Progressing   Problem: Tissue Perfusion: Goal: Adequacy of tissue perfusion will improve Outcome: Progressing   Problem: Coping: Goal: Will identify appropriate support needs Outcome: Progressing   Problem: Health Behavior/Discharge Planning: Goal: Goals will be collaboratively established with patient/family Outcome: Progressing   Problem: Nutrition: Goal: Risk of aspiration will decrease Outcome: Progressing   Problem: Clinical Measurements: Goal: Will remain free from infection Outcome: Progressing

## 2024-06-21 NOTE — Progress Notes (Signed)
 Progress Note   Patient: [name] [name] FMW:[id number] DOB: [birth year] DOA: 05/19/2024     33 DOS: the patient was seen and examined on 06/21/2024    POD#3 s/p shunt placement   Brief hospital course: [name] with PMH HTN, ischemic and hemorrhagic CVA, type 1 diabetes, gastroparesis, kidney and pancreas transplant 2014, subsequent failure of renal transplant, now ESRD on dialysis, anxiety, narcotic abuse.  Presented to the [facility] ED from dialysis as a code stroke on 9/15; subsequently found to have hemorrhagic stroke.  Neurology, neurosurgery, PCCM consulted.  EVD was placed on the left side.  Patient admitted to neuro ICU.  She was intubated on admission.  She had prolonged ICU stay with EVD drain which was changed to VP shunt on 10/15.  Extubated on 9/23.  Transferred to [facility] on 10/17.    She was recently discharged from here in August after she was managed for ischemic/hemorrhagic stroke and was discharged to LTAC.    Important events:  9/15 admitted with ICH plus IVH, EVD was placed for hydrocephalus, intubated 9/22 palliative care meeting.  Family is insistent that they want full scope of care 9/23 Extubated 10/1 -developed fevers pancultures drawn and broad-spectrum antibiotics started 10/2 became afebrile after starting antibiotics, did not tolerate raising EVD 10/6 EVD was raised from 10 to 30 cm water , tolerating well, remained afebrile 10/7 EVD is at 30 cm water , CT scan will be done tomorrow morning, no overnight issues 10/8 she became somnolent, CT head showed increasing hydrocephalus, EVD was titrated down to 10 cm of water  with improvement in mental status 10/15 VP shunt placement 10/17 transferred to progressive unit with [name]   Assessment and Plan: Principal Problem:   Hemorrhagic stroke [facility]) Active Problems:   Moderate protein-calorie malnutrition   Pressure injury of skin   Nontraumatic subcortical hemorrhage of right cerebral  hemisphere [facility])   Acute right parietotemporal intraparenchymal hemorrhage/intraventricular hemorrhage Uncontrolled hypertension Obstructive hydrocephalus s/p EVD changed to VP shunt Seizure disorder Currently POD #3 s/p VP shunt placement.  Remains stable on Vimpat  100 mg twice daily.   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  Dialyzed on MWF schedule.  Has AV fistula on the right upper extremity.     Intermittent low-grade fever: Thought to be secondary to intracranial hemorrhage.  Previous blood cultures showed contamination.  S/p antibiotics for respiratory cultures positive for Serratia and Klebsiella.  Repeat cultures negative thus far.  History of pancreatic transplantation: On cyclosporine , mycophenolate , prednisone    Hypertension:  Slightly elevated today 140s/70s, however yesterday was just borderline hypotensive.  Will continue with antihypertensives Norvasc , clonidine , Coreg , hydralazine  on non-HD days only.   Anemia of chronic disease: No signs of active bleeding.  Hemoglobin stable   Type 1 diabetes with vasculopathy/gastroparesis: Not on long-term insulin  anymore due to pancreatic transplant.  Monitor blood sugars.  On sliding scale.   Moderate protein calorie malnutrition: Has PEG tube.  Continue tube feeds   Stage I sacral pressure ulcer present on admission: Continue wound care   Debility/deconditioning/disposition/goals of care: Young patient with multiple comorbidities.  Palliative care consulted for goals of care conversations, family insist she remains full code.  Palliative has signed off 10/17.  May end up with prolonged hospitalization.  May need to go back to LTAC once she is ready.  PT/OT consulted    Subjective:  Patient sleeping soundly upon entering the room.  Mother at bedside.  Had just received messaging from  nurse regarding headache 10/10 without relief from Tylenol .  Discussed with  mother at bedside, who says that headache is not that severe, the Tylenol  is working, and patient had just fallen asleep.  Physical Exam: BP (!) 141/73 (BP Location: Left Arm)   Pulse 93   Temp 97.6 F (36.4 C) (Oral)   Resp 18   Ht 5' 3 (1.6 m)   Wt 51.5 kg   SpO2 95%   BMI 20.11 kg/m    General appearance: Sleeping comfortably on exam, mother at bedside, no distress HEENT: Dressing in place on right side with no further drainage past markings Cor: RRR, without murmurs, rubs. no LE edema Resp: Normal respiratory rate and rhythm.  CTAB without rales or wheezes. Abd:  No TTP or rebound all quadrants.   Skin: Skin intact without significant rashes or lesions. Neuro: Unable to assess given sleeping soundly Psych: Unable to assess given sleeping soundly  Data Reviewed: CBC stable from 10/17 BMP Phosphorus improved from 10/17 CT head without contrast 10/16 showing improved decompression of right lateral ventricle, partial decompression left lateral ventricle, residual intraparenchymal hemorrhage, encephalomalacia changes  Recent Labs  Lab 06/18/24 0353 06/18/24 1937 06/19/24 0556 06/20/24 0526 06/21/24 0505  NA 131*   < > 134* 135 133*  K 4.9   < > 4.8 3.7 3.6  CO2 26   < > 23 26 26   BUN 57*   < > 79* 44* 30*  CREATININE 3.56*   < > 5.00* 3.24* 2.24*  MG 3.1*  --   --   --   --   WBC 4.0   < > 7.5 5.1 5.3  HGB 9.3*   < > 9.3* 8.9* 9.6*  PLT 177   < > 169 144* 167   < > = values in this interval not displayed.   Family Communication: Mother at bedside  Disposition: Status is: Inpatient Remains inpatient appropriate because: POD #3 s/p shunt insertion, need for inpatient rehab  Author: Betsey CHRISTELLA Helling, MD 06/21/2024 2:18 PM  For on call review [email or web address].

## 2024-06-21 NOTE — Progress Notes (Signed)
   Providing Compassionate, Quality Care - Together   Subjective: Patient alert, but not interactive. Her mother is at the bedside.  Objective: Vital signs in last 24 hours: Temp:  [97.8 F (36.6 C)-99 F (37.2 C)] 99 F (37.2 C) (10/18 0843) Pulse Rate:  [90-104] 93 (10/18 0843) Resp:  [15-27] 18 (10/18 0843) BP: (78-183)/(35-82) 142/73 (10/18 0843) SpO2:  [93 %-100 %] 95 % (10/18 0843) Weight:  [51.5 kg-53.1 kg] 51.5 kg (10/18 0500)  Intake/Output from previous day: 10/17 0701 - 10/18 0700 In: 460 [NG/GT:460] Out: 900  Intake/Output this shift: No intake/output data recorded.  Alert PERRLA No speech MAE Dressing removed. Moderate size blood clot over incision. Site oozing. Cleansed with saline and new dressing applied.  Lab Results: Recent Labs    06/20/24 0526 06/21/24 0505  WBC 5.1 5.3  HGB 8.9* 9.6*  HCT 27.2* 29.7*  PLT 144* 167   BMET Recent Labs    06/20/24 0526 06/21/24 0505  NA 135 133*  K 3.7 3.6  CL 90* 90*  CO2 26 26  GLUCOSE 135* 140*  BUN 44* 30*  CREATININE 3.24* 2.24*  CALCIUM  11.0* 10.7*    Studies/Results: No results found.  Assessment/Plan: Patient underwent placement of VP shunt by Dr. Mavis on 06/18/2024. Her follow up scan showed improvement of ventriculomegaly.   LOS: 33 days   -Continue supportive efforts. -Change dressing at right crani site as needed to keep incision dry.  I am in communication with my attending and they agree with the plan for this patient.   Gerard Beck, DNP, AGNP-C Nurse Practitioner  [facility] [address], [place], [name] [address] P: [phone]    F: [phone]  06/21/2024, 9:31 AM

## 2024-06-21 NOTE — Progress Notes (Addendum)
 Called to bedside by RN for Total of 3x vomiting episodes today, no full bowel movement today and per her mother she is not passing gas today, and pt complains of belly pain  Subjective:  When entering the room, mother at bedside and patient sound asleep in left lateral decubitus position. Patient passing flatus several times during conversation. Patient wakes up enough to provide one-words answers to questions about abdominal pain and nausea. Mom describes 3 episodes of vomiting that is bile-colored and exclusively post-tussive in nature. Mom reports last BM overnight around 0200.   Objective: Vitals:   06/21/24 1200 06/21/24 1615  BP: (!) 141/73 (!) 155/78  Pulse: 93 96  Resp:    Temp: 97.6 F (36.4 C) 98.3 F (36.8 C)  SpO2:  97%   General appearance: sleeping soundly, will awake to answer questions Cor: RRR, without murmurs, rubs.   Resp:  Normal respiratory rate and rhythm.  CTAB without rales or wheezes. Abd:  No TTP or rebound all quadrants. Abdomen soft. Patient slept through both light and deep palpation.  AP:  Given vomiting is post-tussive, discussed with mom and RN oral care with damp sponge and mouth rinse PRN. No anti-emetics indicates at this time. Belly soft, non-distended, and non-tender. Passing copious flatus at bedside. Low suspicion for bowel obstruction.   Night team to follow.   Dorothyann Helling, MD 06/21/24  6:26 PM

## 2024-06-21 NOTE — Evaluation (Signed)
 Speech Language Pathology Evaluation Patient Details Name: [name] [name] MRN: [id number] DOB: 23-Oct-1980 Today's Date: 06/21/2024 Time: 8369-8355 SLP Time Calculation (min) (ACUTE ONLY): 14 min  Problem List:  Patient Active Problem List   Diagnosis Date Noted   Pressure injury of skin 06/02/2024   Nontraumatic subcortical hemorrhage of right cerebral hemisphere (HCC) 06/02/2024   Hemorrhagic stroke (HCC) 05/19/2024   Pontine hemorrhage (HCC) 02/15/2024   Malnutrition of moderate degree 02/15/2024   SAH (subarachnoid hemorrhage) (HCC) 02/15/2024   Colitis 02/14/2024   History of diabetic gastroparesis 02/14/2024   History of pancreatic islet cell transplantation 02/14/2024   Acute metabolic encephalopathy 02/14/2024   Prolonged QT interval 02/14/2024   Controlled type 2 diabetes mellitus with complication, without long-term current use of insulin  (HCC) 02/14/2024   Sepsis (HCC) 02/14/2024   Chronic, continuous use of opioids 11/12/2023   Need for acute hemodialysis due to missed session(HCC) 11/12/2023   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/12/2023   Vaginal bleeding 06/13/2023   ESRD needing dialysis (HCC) 03/07/2022   Chronic pain 03/07/2022   ESRD (end stage renal disease) (HCC)    Hypertensive emergency 01/31/2022   Hypertensive crisis 01/30/2022   Hypervolemia associated with renal insufficiency 12/23/2021   Hemorrhagic gastritis    Subdural hematoma (HCC) 12/02/2021   Acute blood loss anemia 12/02/2021   Chronic abdominal pain 12/02/2021   Thrombocytopenia 12/02/2021   Abnormal CT of the abdomen 12/02/2021   Type 1 diabetes (HCC) 12/02/2021   Fever 11/07/2021   Acute encephalopathy 11/06/2021   Pulmonary edema 11/06/2021   Hypertensive urgency 11/06/2021   Type 2 diabetes mellitus with peripheral neuropathy (HCC) 11/06/2021   History of renal transplant 11/06/2021   COVID-19 virus infection 11/06/2021   HTN (hypertension), malignant  08/02/2021   ESRD on dialysis (HCC) 02/25/2021   Coagulation defect, unspecified 12/22/2020   Acute cough 10/04/2020   Allergy, unspecified, initial encounter 05/21/2020   Anaphylactic shock, unspecified, initial encounter 05/21/2020   Pancytopenia (HCC) 02/27/2020   Shortness of breath 04/23/2019   Hypocalcemia 12/30/2018   Aortic atherosclerosis 12/27/2018   Acute respiratory failure with hypoxia (HCC) 12/22/2018   Hyperkalemia    Bilateral pneumonia 12/21/2018   Pancreas transplant status (HCC) 12/21/2018   Immunosuppression 12/21/2018   Chest pain 12/21/2018   CAP (community acquired pneumonia) 12/20/2018   Other staphylococcus as the cause of diseases classified elsewhere 08/16/2018   Moderate protein-calorie malnutrition 04/18/2018   Encounter for screening for respiratory tuberculosis 04/06/2018   Gout 04/06/2018   Iron  deficiency anemia, unspecified 04/06/2018   Leiomyoma of uterus, unspecified 04/06/2018   Secondary hyperparathyroidism of renal origin 04/06/2018   Acute rejection of kidney transplant 07/11/2017   Elevated serum creatinine 06/27/2017   E. coli UTI 12/18/2014   Transplant rejection 09/03/2014   Cyst of ovary, left 09/01/2014   Immunosuppressive management encounter following kidney transplant 08/26/2014   Acne 04/28/2014   Itch of skin 04/28/2014   Diabetic gastroparesis associated with type 1 diabetes mellitus (HCC) 11/07/2013   Nausea and vomiting 11/07/2013   Lesion of spleen 10/07/2013   Metabolic acidosis 09/25/2013   Patient's noncompliance with other medical treatment and regimen 09/12/2013   Narcotic dependence (HCC) 08/31/2013   Generalized pain 08/30/2013   Hypomagnesemia 08/30/2013   Failed kidney transplant 08/21/2013   Hypophosphatemia 07/28/2013   End stage renal disease (HCC) 02/06/2012   Pleural effusion, bilateral 11/16/2011   Anemia in chronic kidney disease (CKD) 11/16/2011   Pericardial effusion 11/15/2011   Fluid overload  11/15/2011   Abdominal pain, epigastric 11/15/2011   CKD (chronic kidney disease) stage 5, GFR less than 15 ml/min (HCC) 11/03/2011   Hypertension 10/27/2011   Renal failure, chronic 10/27/2011   MEDIAL EPICONDYLITIS, LEFT 12/17/2007   ABSCESS, AXILLA, LEFT 12/03/2007   Type 2 diabetes mellitus with other diabetic kidney complication (HCC) 11/05/2007   Past Medical History:  Past Medical History:  Diagnosis Date   Anemia    Anxiety    DM (diabetes mellitus), type 1 (HCC)    ESRD on hemodialysis (HCC)    Gastroparesis    History of simultaneous kidney and pancreas transplant (HCC) 2014   Hypertension    Ischemic cerebrovascular accident (CVA) (HCC) 11/2021   Narcotic abuse (HCC)    Non compliance with medical treatment    Stroke [facility])    Vision loss    Past Surgical History:  Past Surgical History:  Procedure Laterality Date   A/V FISTULAGRAM Right 02/17/2021   Procedure: A/V FISTULAGRAM;  Surgeon: Gretta Lonni PARAS, MD;  Location: [facility] INVASIVE CV LAB;  Service: Cardiovascular;  Laterality: Right;   AV FISTULA PLACEMENT  11/17/2011   Procedure: ARTERIOVENOUS (AV) FISTULA CREATION;  Surgeon: Krystal JULIANNA Doing, MD;  Location: [facility] OR;  Service: Vascular;  Laterality: Right;   BIOPSY  12/03/2021   Procedure: BIOPSY;  Surgeon: Legrand Victory LITTIE DOUGLAS, MD;  Location: [facility] ENDOSCOPY;  Service: Gastroenterology;;   ESOPHAGOGASTRODUODENOSCOPY (EGD) WITH PROPOFOL  N/A 12/03/2021   Procedure: ESOPHAGOGASTRODUODENOSCOPY (EGD) WITH PROPOFOL ;  Surgeon: Legrand Victory LITTIE DOUGLAS, MD;  Location: [facility] ENDOSCOPY;  Service: Gastroenterology;  Laterality: N/A;   EYE SURGERY     HEMATOMA EVACUATION Right 02/25/2021   Procedure: EVACUATION HEMATOMA RIGHT CHEST;  Surgeon: Sheree Penne Lonni, MD;  Location: [facility] OR;  Service: Vascular;  Laterality: Right;   INSERTION OF DIALYSIS CATHETER Right 12/08/2020   Procedure: INSERTION OF TUNNELED DIALYSIS CATHETER;  Surgeon: Gretta Lonni PARAS, MD;  Location: [facility] OR;  Service:  Vascular;  Laterality: Right;   IR ANGIO INTRA EXTRACRAN SEL INTERNAL CAROTID BILAT MOD SED  02/27/2024   IR ANGIO VERTEBRAL SEL VERTEBRAL UNI R MOD SED  02/27/2024   IR GASTROSTOMY TUBE MOD SED  03/20/2024   IR GJ TUBE CHANGE  04/15/2024   IR GJ TUBE CHANGE  05/21/2024   IR REMOVAL TUN CV CATH W/O FL  05/03/2021   KIDNEY TRANSPLANT     LAPAROSCOPIC REVISION VENTRICULAR-PERITONEAL (V-P) SHUNT N/A 06/18/2024   Procedure: REVISION, PROCEDURE INVOLVING VENTRICULOPERITONEAL SHUNT, LAPAROSCOPIC;  Surgeon: Vernetta Berg, MD;  Location: [facility] OR;  Service: General;  Laterality: N/A;   NEPHRECTOMY TRANSPLANTED ORGAN     pancrease transplant     PERIPHERAL VASCULAR BALLOON ANGIOPLASTY Right 02/17/2021   Procedure: PERIPHERAL VASCULAR BALLOON ANGIOPLASTY;  Surgeon: Gretta Lonni PARAS, MD;  Location: [facility] INVASIVE CV LAB;  Service: Cardiovascular;  Laterality: Right;   REFRACTIVE SURGERY     REVISON OF ARTERIOVENOUS FISTULA Right 12/08/2020   Procedure: RIGHT ARM ARTERIOVENOUS FISTULA REVISION AND RESECTION;  Surgeon: Gretta Lonni PARAS, MD;  Location: [facility] OR;  Service: Vascular;  Laterality: Right;   REVISON OF ARTERIOVENOUS FISTULA Right 02/25/2021   Procedure: RIGHT ARM ARTERIOVENOUS FISTULA REVISION WITH TRANSPOSITION OF CEPHALIC VEIN ON AXILLARY VEIN;  Surgeon: Gretta Lonni PARAS, MD;  Location: [facility] OR;  Service: Vascular;  Laterality: Right;   VENTRICULOPERITONEAL SHUNT N/A 06/18/2024   Procedure: SHUNT INSERTION VENTRICULAR-PERITONEAL;  Surgeon: Mavis Purchase, MD;  Location: [facility] OR;  Service: Neurosurgery;  Laterality: N/A;  VP SHUNT   HPI:  Patient is a 43 y.o. female who is well known to SLP department from prior hospitalizations. 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 PEG placed in July 2025 and converted to GJ tube in August. She has been hospitalized since  05/19/2024 for left gaze and left sided weakness. 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 Intubated 9/15-9/23. EVD 9/15- 10/15; s/p shunt 10/15. SLP had been ordered following extubation for evaluation of speech, language and cognitive function, however patient remained lethargic and unable to participate; SLP s/o 9/29 and recommended reorder SLE when patient was more consistently alert. PT and OT evaluated patient on 10/18 and recommending intensive inpatient rehab >3 hours/day. OT requested SLE with SLP from MD.   Assessment / Plan / Recommendation Clinical Impression  Patient exhibits a severe cognitive-linguistic impairment as per this evaluation. She was oriented to self (name and birthdate but not age) and place and identified her mother who was in the room. Affect was flat, voice was monotone and she made no eye contact with SLP or with her mother. She exhibited significant delay in responses as well as an initial response rate of less than 30% overall. She initially gave month as May and after SLP repeated several times for her to name a month in the Fall, she eventually said, Halloween. Her mother indicated she loves Halloween and has over 10 different costumes. Patient unable to name any and unable to name any items in the category of Halloween decorations but did tell SLP she has decorations on my balcony. Delays in responses increased and patient started to close eyes, and stated I'm sleepy. She proceeded to vomit greenish liquid emesis. SLP is recommending a trial of therapy targeting basic level cognitive-linguistic abilities to determine if any benefit prior to making any recommendations for skilled services at next venue of care.    SLP Assessment  SLP Recommendation/Assessment: Patient needs continued Speech Language Pathology Services SLP Visit Diagnosis:  Cognitive communication deficit (R41.841)     Assistance Recommended at Discharge  Frequent or constant Supervision/Assistance  Functional Status Assessment Patient has had a recent decline in their functional status and/or demonstrates limited ability to make significant improvements in function in a reasonable and predictable amount of time  Frequency and Duration min 2x/week  1 week      SLP Evaluation Cognition  Overall Cognitive Status: Impaired/Different from baseline Arousal/Alertness: Awake/alert Orientation Level: Oriented to person;Oriented to place;Disoriented to situation;Disoriented to time Month: May Attention: Focused Focused Attention: Impaired Focused Attention Impairment: Verbal basic Executive Function: Initiating Initiating: Impaired Initiating Impairment: Verbal basic       Comprehension  Auditory Comprehension Overall Auditory Comprehension: Impaired Commands: Impaired One Step Basic Commands: 0-24% accurate Interfering Components: Attention;Processing speed EffectiveTechniques: Repetition;Extra processing time;Slowed speech Visual Recognition/Discrimination Discrimination: Not tested Reading Comprehension Reading Status: Not tested    Expression Expression Primary Mode of Expression: Verbal Verbal Expression Overall Verbal Expression: Impaired Initiation: Impaired Level of Generative/Spontaneous Verbalization: Word;Phrase Pragmatics: Impairment Impairments: Abnormal affect;Monotone;Eye contact Interfering Components: Premorbid deficit;Attention Non-Verbal Means of Communication: Not applicable   Oral / Motor  Oral Motor/Sensory Function Overall Oral Motor/Sensory Function: Mild impairment (unable to follow commands to assess fully) Facial Strength: Reduced left Motor Speech Overall Motor Speech: Impaired           Norleen IVAR Blase, MA,  CCC-SLP Speech Therapy

## 2024-06-21 NOTE — Progress Notes (Signed)
 [facility] Progress Note   Subjective:   Patient seen and examined at bedside with mother present.  Mother reports she did not sleep well overnight.  Sleeping soundly now, does not wake to verbal stimuli.  Some hypotension with dialysis yesterday per nurses note.   Objective Vitals:   06/21/24 0320 06/21/24 0500 06/21/24 0843 06/21/24 1200  BP: 128/60  (!) 142/73 (!) 141/73  Pulse: 90  93 93  Resp: 18  18   Temp: 98.3 F (36.8 C)  99 F (37.2 C) 97.6 F (36.4 C)  TempSrc: Oral  Oral Oral  SpO2: 93%  95%   Weight:  51.5 kg    Height:       Physical Exam General:chronically ill appearing female in NAD, dressing on R side of head Heart:RRR, no mrg Lungs:CTAB anteriorly  Abdomen:soft, NTND Extremities:no LE edema Dialysis Access: RU AVF +b   Filed Weights   06/20/24 0450 06/20/24 1820 06/21/24 0500  Weight: 51.6 kg 53.1 kg 51.5 kg    Intake/Output Summary (Last 24 hours) at 06/21/2024 1448 Last data filed at 06/21/2024 0600 Gross per 24 hour  Intake 460 ml  Output 900 ml  Net -440 ml    Additional Objective Labs: Basic Metabolic Panel: Recent Labs  Lab 06/19/24 0556 06/20/24 0526 06/21/24 0505  NA 134* 135 133*  K 4.8 3.7 3.6  CL 92* 90* 90*  CO2 23 26 26   GLUCOSE 115* 135* 140*  BUN 79* 44* 30*  CREATININE 5.00* 3.24* 2.24*  CALCIUM  11.3* 11.0* 10.7*  PHOS 7.1* 4.8* 3.6   CBC: Recent Labs  Lab 06/18/24 0353 06/18/24 1937 06/19/24 0556 06/20/24 0526 06/21/24 0505  WBC 4.0 6.5 7.5 5.1 5.3  HGB 9.3* 9.0* 9.3* 8.9* 9.6*  HCT 29.2* 28.2* 28.6* 27.2* 29.7*  MCV 98.3 98.9 97.6 96.8 98.0  PLT 177 153 169 144* 167   Medications:  feeding supplement (NEPRO CARB STEADY) 55 mL/hr at 06/21/24 0600    carvedilol   25 mg Per Tube 2 times per day on Sunday Monday Wednesday Friday   Chlorhexidine  Gluconate Cloth  6 each Topical Q0600   cloNIDine   0.3 mg Per Tube Once per day on Sunday Monday Wednesday Friday   cycloSPORINE   200 mg Per Tube  QPM   cycloSPORINE   225 mg Per Tube Daily   darbepoetin (ARANESP ) injection - DIALYSIS  100 mcg Subcutaneous Q Wed-1800   fiber  1 packet Per Tube TID   insulin  aspart  0-9 Units Subcutaneous Q4H   lacosamide   100 mg Per Tube BID   multivitamin with minerals  1 tablet Per Tube Daily   mycophenolate   500 mg Per Tube BID   mouth rinse  15 mL Mouth Rinse 4 times per day   oxidized cellulose  1 each Topical Once   pantoprazole  (PROTONIX ) IV  40 mg Intravenous QHS   predniSONE   15 mg Per Tube Q breakfast    Dialysis Orders: [facility] MWF 3h  B400  55.2kg  2K bath   AVF  Heparin  none Mircera 150 mcg q 2 wks, due 9/24     Assessment/ Plan Acute right intracranial hemorrhage: Associated with uncontrolled hypertension. S/P ventriculostomy.  Extubated 9/22. Underwent VP shunt 10/15. Improving MS. 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 Next HD on 06/23/24 Volume: Weight improving.  Does not appear grossly overloaded. Continue UF w/ HD as tolerated. 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 Cont darbe 100 mcg weekly.  Sec hyperparathyroidism: Repeat PTH 124, stable. 1, 25 vit D was low at 8 (24- 80), and vit D 25 wnl at 37 (30-100).  Ca high suspected due to immobility --> gave 1 dose of pamidronate 10/12.  Calcium  slowly improving although remains elevated.  Hypertension: BP's were very high, then dropped low. Now is on norvasc , clonidine  and coreg  on non-hd days only. BP's sig better overall.  Dispo: per pt's mother, pt is to be full scope care, full code.  PT/OT recommending inpatient rehab.   Manuelita Labella, PA-C [facility] 06/21/2024,2:48 PM  LOS: 33 days

## 2024-06-21 NOTE — Evaluation (Signed)
 Occupational Therapy Evaluation Patient Details Name: [name] [name] MRN: [id number] DOB: 11/03/1980 Today's Date: 06/21/2024   History of Present Illness   43 yo female presents 05/19/24 for left gaze and L side weakness. 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 Intubated 9/15-9/23. EVD 9/15- 10/15; s/p shunt 10/15. S/p laparoscopy with intra-abdominal assistance ventricular-peritoneal shunt placement 10/15. PMH: HTN, ESRD on HD s/p renal and pancreatic transplants, DM, gastroparesis, multiple strokes (ischemic and hemorrhagic) with prepontine SAH, hydrocephalus requiring EVD placement     Clinical Impressions PTA, pt was at SNF working on walking and return to BADL. Per mother, prior to first CVA, pt was living at home most recently, driving to and from dialysis, and performing BADL without assist. Upon eval, pt lethargic-obtunded. Presenting with decreased balance, strength, coordination, arousal, problem solving, initiation, attention, vision, and ability to follow commands. Pt needing total A for bed mobility and max A +2 for STS this session. Provided toothbrush, pt does self-initiate oral care and sustains ~2 mins. Pt with excellent support at home; thus, recommending intensive inpatient rehab after discharge >3 hours/day.      If plan is discharge home, recommend the following:   Two people to help with walking and/or transfers;Two people to help with bathing/dressing/bathroom;Assistance with cooking/housework;Assist for transportation;Help with stairs or ramp for entrance     Functional Status Assessment   Patient has had a recent decline in their functional status and demonstrates the ability to make significant improvements in function in a reasonable and predictable amount of time.     Equipment Recommendations   Other (comment) (TBD)      Recommendations for Other Services   Rehab consult;Speech consult     Precautions/Restrictions   Precautions Precautions: Fall Restrictions Weight Bearing Restrictions Per Provider Order: No     Mobility Bed Mobility Overal bed mobility: Needs Assistance Bed Mobility: Supine to Sit, Sit to Supine     Supine to sit: Total assist, +2 for physical assistance, +2 for safety/equipment Sit to supine: Total assist, +2 for physical assistance, +2 for safety/equipment   General bed mobility comments: pt initiation on behalf of pt    Transfers Overall transfer level: Needs assistance Equipment used: 2 person hand held assist Transfers: Sit to/from Stand Sit to Stand: Max assist, +2 physical assistance, +2 safety/equipment           General transfer comment: for power up, hip extension, upright posture, L knee block      Balance Overall balance assessment: Needs assistance Sitting-balance support: Single extremity supported, Bilateral upper extremity supported, Feet supported Sitting balance-Leahy Scale: Poor Sitting balance - Comments: CGA-min A statically   Standing balance support: Bilateral upper extremity supported, During functional activity Standing balance-Leahy Scale: Zero Standing balance comment: +2 A                           ADL either performed or assessed with clinical judgement   ADL Overall ADL's : Needs assistance/impaired Eating/Feeding: NPO   Grooming: Minimal assistance;Sitting Grooming Details (indicate cue type and reason): with max set-up pt self initiated oral care Upper Body Bathing: Maximal assistance;Total assistance;Sitting   Lower Body Bathing: Total assistance;+2 for physical assistance;+2 for safety/equipment;Sit to/from stand   Upper Body Dressing : Maximal assistance;Total assistance   Lower Body Dressing: Maximal assistance;+2 for physical assistance;Total assistance;+2 for safety/equipment;Sit to/from stand  Toilet Transfer: Maximal assistance;+2 for physical assistance;+2 for safety/equipment Toilet Transfer Details (indicate cue type and reason): STS x 2         Functional mobility during ADLs: Maximal assistance;+2 for physical assistance;+2 for safety/equipment       Vision Baseline Vision/History: 1 Wears glasses Ability to See in Adequate Light: 1 Impaired Patient Visual Report: Other (comment) (pt unable to report) Vision Assessment?: Vision impaired- to be further tested in functional context Additional Comments: L gaze at rest but able to track ~30 degrees past midline with increased time and cueing. initially appeared to have poor depth perception with reach to toothbrush but later reaching toward therapist hand without error. poor visual attention to objects, people/tasks     Perception         Praxis Praxis: Impaired Praxis Impairment Details: Motor planning     Pertinent Vitals/Pain Pain Assessment Pain Assessment: PAINAD Breathing: normal Negative Vocalization: none Facial Expression: smiling or inexpressive Body Language: tense, distressed pacing, fidgeting Consolability: no need to console PAINAD Score: 1 Pain Intervention(s): Monitored during session     Extremity/Trunk Assessment Upper Extremity Assessment Upper Extremity Assessment: Generalized weakness;Right hand dominant;RUE deficits/detail;LUE deficits/detail RUE Deficits / Details: actively uses for oral care, generally weak. does not follow commands for MMT LUE Deficits / Details: moves spontaneously against gravity. not following commands for MMT   Lower Extremity Assessment Lower Extremity Assessment: Defer to PT evaluation   Cervical / Trunk Assessment Cervical / Trunk Assessment: Other exceptions Cervical / Trunk Exceptions: forward rounding of shoulders and significant forward head posture with decreased neck extension in upright likely 2/2 weakness   Communication  Communication Communication: Impaired Factors Affecting Communication: Other (comment) (no verbalizations this session and questionably followed 2 commands)   Cognition Arousal: Lethargic, Obtunded Behavior During Therapy: Flat affect Cognition: Cognition impaired, Difficult to assess Difficult to assess due to: Level of arousal       Attention impairment (select first level of impairment): Focused attention (did sustain oral care ~2 mins) Executive functioning impairment (select all impairments): Initiation, Organization, Sequencing, Problem solving OT - Cognition Comments: followed ~2 direct simple one step commands                 Following commands: Impaired Following commands impaired: Follows one step commands inconsistently     Cueing  General Comments   Cueing Techniques: Verbal cues;Gestural cues;Tactile cues      Exercises     Shoulder Instructions      Home Living Family/patient expects to be discharged to:: Private residence Living Arrangements: Alone Available Help at Discharge: Family;Available 24 hours/day Type of Home: Apartment Home Access: Level entry     Home Layout: One level     Bathroom Shower/Tub: Chief Strategy Officer: Standard Bathroom Accessibility: Yes   Home Equipment: None          Prior Functioning/Environment Prior Level of Function : Independent/Modified Independent             Mobility Comments: Ind with no AD, Ind with ADLs ADLs Comments: Drives to dialysis MWF    OT Problem List: Decreased strength;Decreased activity tolerance;Impaired balance (sitting and/or standing);Decreased coordination;Decreased cognition;Impaired vision/perception;Decreased safety awareness;Decreased knowledge of use of DME or AE;Impaired UE functional use   OT Treatment/Interventions: Self-care/ADL training;Therapeutic exercise;DME and/or AE instruction;Therapeutic activities;Patient/family education;Balance  training;Visual/perceptual remediation/compensation;Cognitive remediation/compensation      OT Goals(Current goals can be found in the care plan section)   Acute Rehab OT Goals Patient Stated Goal: per  mother, go home OT Goal Formulation: Patient unable to participate in goal setting Time For Goal Achievement: 07/05/24 Potential to Achieve Goals: Good   OT Frequency:  Min 2X/week    Co-evaluation PT/OT/SLP Co-Evaluation/Treatment: Yes Reason for Co-Treatment: For patient/therapist safety;To address functional/ADL transfers;Necessary to address cognition/behavior during functional activity;Complexity of the patient's impairments (multi-system involvement) PT goals addressed during session: Mobility/safety with mobility OT goals addressed during session: ADL's and self-care      AM-PAC OT 6 Clicks Daily Activity     Outcome Measure Help from another person eating meals?: Total Help from another person taking care of personal grooming?: A Lot Help from another person toileting, which includes using toliet, bedpan, or urinal?: Total Help from another person bathing (including washing, rinsing, drying)?: Total Help from another person to put on and taking off regular upper body clothing?: Total Help from another person to put on and taking off regular lower body clothing?: Total 6 Click Score: 7   End of Session Nurse Communication: Mobility status  Activity Tolerance: Patient tolerated treatment well Patient left: in bed;with call bell/phone within reach;with bed alarm set;with family/visitor present  OT Visit Diagnosis: Unsteadiness on feet (R26.81);Muscle weakness (generalized) (M62.81);History of falling (Z91.81);Other symptoms and signs involving cognitive function;Cognitive communication deficit (R41.841);Low vision, both eyes (H54.2)                Time: 8982-8953 OT Time Calculation (min): 29 min Charges:  OT General Charges $OT Visit: 1 Visit OT Evaluation $OT Eval  Moderate Complexity: 1 Mod  Elma JONETTA Lebron FREDERICK, OTR/L [facility] Acute Rehabilitation Office: [phone]   Elma JONETTA Lebron 06/21/2024, 11:11 AM

## 2024-06-21 NOTE — Progress Notes (Signed)
 IP rehab screen - Noted PT/OT recommending inpatient rehab.  Will place an order for a rehab consult so that we can further evaluate the patient.  [phone]

## 2024-06-21 NOTE — Evaluation (Signed)
 Physical Therapy Evaluation Patient Details Name: [name] [name] MRN: [id number] DOB: 22-Aug-1981 Today's Date: 06/21/2024  History of Present Illness  43 yo female presents 05/19/24 for left gaze and L side weakness. 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 Intubated 9/15-9/23. EVD 9/15- 10/15; s/p shunt 10/15. S/p laparoscopy with intra-abdominal assistance ventricular-peritoneal shunt placement 10/15. PMH: HTN, ESRD on HD s/p renal and pancreatic transplants, DM, gastroparesis, multiple strokes (ischemic and hemorrhagic) with prepontine SAH, hydrocephalus requiring EVD placement   Clinical Impression  History and PLOF taken from pt's mother as pt was no verbalizing with limited following of commands. Pt was at SNF and working on taking steps for a short distance with assist. In today's session, pt required TotalAx2 for bed mobility and ModA to CGA for seated balance. Pt had a left gaze preference with ability to rotate head slightly past neutral to the right with frequent cueing. Able to stand with MaxAx2 with blocking of L knee due to instability, however, no buckling observed. At this time, recommending >3hrs post acute rehab to decrease caregiver burden and improve quality of life. Pt has 24/7 assist available upon return home. Acute PT to follow.       If plan is discharge home, recommend the following: A lot of help with walking and/or transfers;A lot of help with bathing/dressing/bathroom;Assistance with cooking/housework;Direct supervision/assist for medications management;Direct supervision/assist for financial management;Assist for transportation;Help with stairs or ramp for entrance   Can travel by private vehicle    No    Equipment Recommendations Hospital bed;Hoyer lift;Wheelchair (measurements PT);Wheelchair cushion (measurements PT);BSC/3in1   Recommendations for Other Services  Rehab consult    Functional Status Assessment Patient has had a recent decline in their functional status and demonstrates the ability to make significant improvements in function in a reasonable and predictable amount of time.     Precautions / Restrictions Precautions Precautions: Fall Recall of Precautions/Restrictions: Impaired Precaution/Restrictions Comments: LUQ PEG Restrictions Weight Bearing Restrictions Per Provider Order: No      Mobility  Bed Mobility Overal bed mobility: Needs Assistance Bed Mobility: Supine to Sit, Sit to Supine    Supine to sit: Total assist, +2 for physical assistance, +2 for safety/equipment Sit to supine: Total assist, +2 for physical assistance, +2 for safety/equipment   General bed mobility comments: pt initiation on behalf of pt    Transfers Overall transfer level: Needs assistance Equipment used: 2 person hand held assist Transfers: Sit to/from Stand Sit to Stand: Max assist, +2 physical assistance, +2 safety/equipment    General transfer comment: Assist for power up with hip unable to reach full hip or knee extension. L knee blocked with instability noted, however, no buckling    Ambulation/Gait  General Gait Details: unable this date    Modified Rankin (Stroke Patients Only) Modified Rankin (Stroke Patients Only) Pre-Morbid Rankin Score: Moderately severe disability Modified Rankin: Severe disability     Balance Overall balance assessment: Needs assistance Sitting-balance support: Single extremity supported, Bilateral upper extremity supported, Feet supported Sitting balance-Leahy Scale: Poor Sitting balance - Comments: CGA-min A statically   Standing balance support: Bilateral upper extremity supported, During functional activity Standing balance-Leahy Scale: Zero Standing balance comment: +2 A        Pertinent Vitals/Pain Pain Assessment Pain Assessment: PAINAD Breathing:  normal Negative Vocalization: none Facial Expression: smiling or inexpressive Body Language: tense, distressed pacing, fidgeting Consolability: no need to console PAINAD  Score: 1 Pain Intervention(s): Monitored during session    Home Living Family/patient expects to be discharged to:: Private residence Living Arrangements: Alone Available Help at Discharge: Family;Available 24 hours/day Type of Home: Apartment Home Access: Level entry   Home Layout: One level Home Equipment: None      Prior Function Prior Level of Function : Independent/Modified Independent      Mobility Comments: Ind with no AD, Ind with ADLs ADLs Comments: Drives to dialysis MWF     Extremity/Trunk Assessment   Upper Extremity Assessment Upper Extremity Assessment: Defer to OT evaluation RUE Deficits / Details: actively uses for oral care, generally weak. does not follow commands for MMT LUE Deficits / Details: moves spontaneously against gravity. not following commands for MMT    Lower Extremity Assessment Lower Extremity Assessment: Generalized weakness;Difficult to assess due to impaired cognition (spontaneous movement with no movement to command, L>R instability noted in standing with no buckling)    Cervical / Trunk Assessment Cervical / Trunk Assessment: Other exceptions Cervical / Trunk Exceptions: forward rounding of shoulders and significant forward head posture with decreased neck extension in upright likely 2/2 weakness  Communication   Communication Communication: Impaired Factors Affecting Communication: Other (comment) (no verbalizations this session and questionably followed 2 commands)    Cognition Arousal: Lethargic, Obtunded Behavior During Therapy: Flat affect   PT - Cognitive impairments: Difficult to assess Difficult to assess due to: Level of arousal, Impaired communication      PT - Cognition Comments: No verbalizations with pt only opening eyes after lifting eyelids.  Able to perform automatic tasks like brushing teeth with intermittent command following to reach and visually track Following commands: Impaired Following commands impaired: Follows one step commands inconsistently     Cueing Cueing Techniques: Verbal cues, Gestural cues, Tactile cues     General Comments General comments (skin integrity, edema, etc.): Mother present and supportive during eval     PT Assessment Patient needs continued PT services  PT Problem List Decreased strength;Decreased range of motion;Decreased activity tolerance;Decreased balance;Decreased coordination;Decreased mobility;Decreased knowledge of use of DME;Decreased cognition;Decreased safety awareness;Decreased knowledge of precautions       PT Treatment Interventions DME instruction;Gait training;Functional mobility training;Therapeutic activities;Therapeutic exercise;Balance training;Neuromuscular re-education;Patient/family education;Wheelchair mobility training    PT Goals (Current goals can be found in the Care Plan section)  Acute Rehab PT Goals Patient Stated Goal: unable to state goal PT Goal Formulation: Patient unable to participate in goal setting Time For Goal Achievement: 07/05/24 Potential to Achieve Goals: Fair    Frequency Min 2X/week     Co-evaluation   Reason for Co-Treatment: For patient/therapist safety;To address functional/ADL transfers;Necessary to address cognition/behavior during functional activity;Complexity of the patient's impairments (multi-system involvement) PT goals addressed during session: Mobility/safety with mobility OT goals addressed during session: ADL's and self-care       AM-PAC PT 6 Clicks Mobility  Outcome Measure Help needed turning from your back to your side while in a flat bed without using bedrails?: Total Help needed moving from lying on your back to sitting on the side of a flat bed without using bedrails?: Total Help needed moving to and from a bed  to a chair (including a wheelchair)?: Total Help needed standing up from a chair using your arms (e.g., wheelchair or bedside chair)?: Total Help needed to walk in hospital room?: Total Help needed climbing 3-5 steps with a railing? : Total 6 Click Score: 6    End of Session Equipment Utilized During Treatment: Gait belt  Activity Tolerance: Patient tolerated treatment well Patient left: in bed;with call bell/phone within reach;with bed alarm set;with family/visitor present Nurse Communication: Mobility status;Need for lift equipment PT Visit Diagnosis: Unsteadiness on feet (R26.81);Other abnormalities of gait and mobility (R26.89);Muscle weakness (generalized) (M62.81);Difficulty in walking, not elsewhere classified (R26.2);Other symptoms and signs involving the nervous system (R29.898)    Time: 8983-8954 PT Time Calculation (min) (ACUTE ONLY): 29 min   Charges:   PT Evaluation $PT Eval Moderate Complexity: 1 Mod   PT General Charges $$ ACUTE PT VISIT: 1 Visit       Kate ORN, PT, DPT Secure Chat [facility]erred  Rehab Office [phone]   Kate BRAVO Wendolyn 06/21/2024, 11:24 AM

## 2024-06-21 NOTE — Plan of Care (Signed)
  Problem: Metabolic: Goal: Ability to maintain appropriate glucose levels will improve Outcome: Progressing   Problem: Clinical Measurements: Goal: Will remain free from infection Outcome: Progressing Goal: Respiratory complications will improve Outcome: Progressing   Problem: Education: Goal: Ability to describe self-care measures that may prevent or decrease complications (Diabetes Survival Skills Education) will improve Outcome: Not Progressing   Problem: Health Behavior/Discharge Planning: Goal: Ability to identify and utilize available resources and services will improve Outcome: Not Progressing   Problem: Education: Goal: Knowledge of disease or condition will improve Outcome: Not Progressing

## 2024-06-22 ENCOUNTER — Inpatient Hospital Stay (HOSPITAL_COMMUNITY)

## 2024-06-22 DIAGNOSIS — I619 Nontraumatic intracerebral hemorrhage, unspecified: Secondary | ICD-10-CM | POA: Diagnosis not present

## 2024-06-22 LAB — BASIC METABOLIC PANEL WITH GFR
Anion gap: 18 — ABNORMAL HIGH (ref 5–15)
BUN: 66 mg/dL — ABNORMAL HIGH (ref 6–20)
CO2: 29 mmol/L (ref 22–32)
Calcium: 11.1 mg/dL — ABNORMAL HIGH (ref 8.9–10.3)
Chloride: 88 mmol/L — ABNORMAL LOW (ref 98–111)
Creatinine, Ser: 4.1 mg/dL — ABNORMAL HIGH (ref 0.44–1.00)
GFR, Estimated: 13 mL/min — ABNORMAL LOW (ref >60)
Glucose, Bld: 141 mg/dL — ABNORMAL HIGH (ref 70–99)
Potassium: 3.4 mmol/L — ABNORMAL LOW (ref 3.5–5.1)
Sodium: 135 mmol/L (ref 135–145)

## 2024-06-22 LAB — GLUCOSE, CAPILLARY
Glucose-Capillary: 128 mg/dL — ABNORMAL HIGH (ref 70–99)
Glucose-Capillary: 145 mg/dL — ABNORMAL HIGH (ref 70–99)
Glucose-Capillary: 147 mg/dL — ABNORMAL HIGH (ref 70–99)
Glucose-Capillary: 152 mg/dL — ABNORMAL HIGH (ref 70–99)
Glucose-Capillary: 153 mg/dL — ABNORMAL HIGH (ref 70–99)
Glucose-Capillary: 192 mg/dL — ABNORMAL HIGH (ref 70–99)
Glucose-Capillary: 217 mg/dL — ABNORMAL HIGH (ref 70–99)

## 2024-06-22 LAB — CBC
HCT: 28.1 % — ABNORMAL LOW (ref 36.0–46.0)
Hemoglobin: 9.1 g/dL — ABNORMAL LOW (ref 12.0–15.0)
MCH: 31.5 pg (ref 26.0–34.0)
MCHC: 32.4 g/dL (ref 30.0–36.0)
MCV: 97.2 fL (ref 80.0–100.0)
Platelets: 137 K/uL — ABNORMAL LOW (ref 150–400)
RBC: 2.89 MIL/uL — ABNORMAL LOW (ref 3.87–5.11)
RDW: 14.7 % (ref 11.5–15.5)
WBC: 5.1 K/uL (ref 4.0–10.5)
nRBC: 0 % (ref 0.0–0.2)

## 2024-06-22 LAB — PHOSPHORUS: Phosphorus: 4.5 mg/dL (ref 2.5–4.6)

## 2024-06-22 MED ORDER — CHLORHEXIDINE GLUCONATE CLOTH 2 % EX PADS
6.0000 | MEDICATED_PAD | Freq: Every day | CUTANEOUS | Status: DC
  Administered 2024-06-23 – 2024-07-03 (×10): 6 via TOPICAL

## 2024-06-22 NOTE — Progress Notes (Signed)
 [facility] Progress Note   Subjective:   Patient seen and examined at bedside. Alert, but does not respond to commands.  Mother reports she has a cough and has coughing until she vomits x 3 yesterday.   Objective Vitals:   06/22/24 0000 06/22/24 0026 06/22/24 0500 06/22/24 0736  BP: (!) 154/45 (!) 150/72 138/69 (!) 142/62  Pulse: 91  99 98  Resp:    19  Temp: 99 F (37.2 C)  98.6 F (37 C) 98 F (36.7 C)  TempSrc: Axillary  Axillary Oral  SpO2: 97%  96% 97%  Weight:   53.3 kg   Height:       Physical Exam General:chronically ill appearing female in NAD, alert, not following commands Heart:RRR Lungs:nml WOB on RA Abdomen:soft, NTND Extremities:no LE edema Dialysis Access: AVF   Filed Weights   06/20/24 1820 06/21/24 0500 06/22/24 0500  Weight: 53.1 kg 51.5 kg 53.3 kg    Intake/Output Summary (Last 24 hours) at 06/22/2024 1138 Last data filed at 06/21/2024 1615 Gross per 24 hour  Intake --  Output 30 ml  Net -30 ml    Additional Objective Labs: Basic Metabolic Panel: Recent Labs  Lab 06/20/24 0526 06/21/24 0505 06/22/24 0644  NA 135 133* 135  K 3.7 3.6 3.4*  CL 90* 90* 88*  CO2 26 26 29   GLUCOSE 135* 140* 141*  BUN 44* 30* 66*  CREATININE 3.24* 2.24* 4.10*  CALCIUM  11.0* 10.7* 11.1*  PHOS 4.8* 3.6 4.5   CBC: Recent Labs  Lab 06/18/24 1937 06/19/24 0556 06/20/24 0526 06/21/24 0505 06/22/24 0644  WBC 6.5 7.5 5.1 5.3 5.1  HGB 9.0* 9.3* 8.9* 9.6* 9.1*  HCT 28.2* 28.6* 27.2* 29.7* 28.1*  MCV 98.9 97.6 96.8 98.0 97.2  PLT 153 169 144* 167 137*    Studies/Results: DG Chest Port 1 View Result Date: 06/22/2024 CLINICAL DATA:  Cough. EXAM: PORTABLE CHEST 1 VIEW COMPARISON:  06/04/2024 FINDINGS: Ventriculostomy catheter over the right neck, chest and abdomen. Three skin staples over the right upper chest. Several surgical clips over the lateral right upper chest. Lungs are adequately inflated without airspace consolidation or effusion.  Subtle hazy perihilar prominence which may be due to subtle vascular congestion or acute bronchitic process. Cardiomediastinal silhouette and remainder of the exam is unchanged. IMPRESSION: Subtle hazy perihilar prominence which may be due to subtle vascular congestion or acute bronchitic process. Electronically Signed   By: Toribio Agreste M.D.   On: 06/22/2024 10:24    Medications:  feeding supplement (NEPRO CARB STEADY) 55 mL/hr at 06/21/24 0600    carvedilol   25 mg Per Tube 2 times per day on Sunday Monday Wednesday Friday   Chlorhexidine  Gluconate Cloth  6 each Topical Q0600   cloNIDine   0.3 mg Per Tube Once per day on Sunday Monday Wednesday Friday   cycloSPORINE   200 mg Per Tube QPM   cycloSPORINE   225 mg Per Tube Daily   darbepoetin (ARANESP ) injection - DIALYSIS  100 mcg Subcutaneous Q Wed-1800   fiber  1 packet Per Tube TID   insulin  aspart  0-9 Units Subcutaneous Q4H   lacosamide   100 mg Per Tube BID   multivitamin with minerals  1 tablet Per Tube Daily   mycophenolate   500 mg Per Tube BID   mouth rinse  15 mL Mouth Rinse 4 times per day   oxidized cellulose  1 each Topical Once   pantoprazole  (PROTONIX ) IV  40 mg Intravenous QHS   predniSONE   15  mg Per Tube Q breakfast    Dialysis Orders: [facility] MWF 3h  B400  55.2kg  2K bath   AVF  Heparin  none Mircera 150 mcg q 2 wks, due 9/24     Assessment/ Plan Acute right intracranial hemorrhage: Associated with uncontrolled hypertension. S/P ventriculostomy.  Extubated 9/22. Underwent VP shunt 10/15. Improving MS.  Cough - CXR showed subtle hazy perihilar prominence possibly d/t subtle vascular congestion or acute bronchitic process. On RA, does not appear overloaded.   ESRD/ hx of renal allograft failure: usual HD MWF. Next HD on 06/23/24 Volume: Weight improving.  Does not appear grossly overloaded. CXR with no pulmonary edema.  No acute indication for HD today. Continue UF w/ HD as tolerated. 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 Cont darbe 100 mcg weekly.  Sec hyperparathyroidism: Repeat PTH 124, stable. 1, 25 vit D was low at 8 (24- 80), and vit D 25 wnl at 37 (30-100).  Ca high suspected due to immobility --> gave 1 dose of pamidronate 10/12.  Calcium  slowly improving although remains elevated.  Hypertension: BP's were very high, then dropped low. Now is on norvasc , clonidine  and coreg  on non-hd days only. BP's sig better overall.  Dispo: per pt's mother, pt is to be full scope care, full code.  PT/OT recommending inpatient rehab.  Manuelita Labella, PA-C [facility] 06/22/2024,11:38 AM  LOS: 34 days

## 2024-06-22 NOTE — PMR Pre-admission (Shared)
 PMR Admission Coordinator Pre-Admission Assessment  Patient: [name] [name] is an 43 y.o., female MRN: [id number] DOB: 1980-12-27 Height: 5' 3 (160 cm) Weight: 53.3 kg  Insurance Information HMO: ***    PPO: ***     PCP:      IPA:      80/20:      OTHER:  PRIMARY: [employer] Dual Complete      Policy#: [id number]      Subscriber: patient CM Name: ***      Phone#: ***     Fax#: *** Pre-Cert#: ***      Employer: *** Benefits:  Phone #: ***     Name: *** Eustacio. Date: ***     Deduct: ***      Out of Pocket Max: ***      Life Max: *** CIR: ***      SNF: *** Outpatient: ***     Co-Pay: *** Home Health: ***      Co-Pay: *** DME: ***     Co-Pay: *** Providers: in-network SECONDARY: Medicaid of [place]      Policy#: [id number] p     Phone#: [phone]  Financial Counselor:       Phone#:   The "Data Collection Information Summary" for patients in Inpatient Rehabilitation Facilities with attached "Privacy Act Statement-Health Care Records" was provided and verbally reviewed with: Patient and Family  Emergency Contact Information Contact Information     Name Relation Home Work Mobile   A,Mona Mother   [phone]   Lennie Nori Benne   [phone]   ROSE OLE Benne [phone]  [phone]      Other Contacts   None on File     Current Medical History  Patient Admitting Diagnosis: CVA History of Present Illness: *** Complete NIHSS TOTAL: 12  Patient's medical record from [facility] has been reviewed by the rehabilitation admission coordinator and physician.  Past Medical History  Past Medical History:  Diagnosis Date   Anemia    Anxiety    DM (diabetes mellitus), type 1 (HCC)    ESRD on hemodialysis (HCC)    Gastroparesis    History of simultaneous kidney and pancreas transplant (HCC) 2014   Hypertension    Ischemic cerebrovascular accident (CVA) (HCC) 11/2021   Narcotic abuse (HCC)    Non compliance with medical treatment    Stroke [facility])    Vision loss      Has the patient had major surgery during 100 days prior to admission? Yes  Family History   family history includes Hypertension in her father.  Current Medications  Current Facility-Administered Medications:    acetaminophen  (TYLENOL ) tablet 650 mg, 650 mg, Per Tube, Q4H PRN, 650 mg at 06/21/24 0920 **OR** acetaminophen  (TYLENOL ) 160 MG/5ML solution 650 mg, 650 mg, Per Tube, Q4H PRN, 650 mg at 06/19/24 0302 **OR** acetaminophen  (TYLENOL ) suppository 650 mg, 650 mg, Rectal, Q4H PRN, Mavis Purchase, MD   carvedilol  (COREG ) tablet 25 mg, 25 mg, Per Tube, 2 times per day on Sunday Monday Wednesday Friday, Wilson, Tara N, PA-C, 25 mg at 06/22/24 9182   [START ON 06/23/2024] Chlorhexidine  Gluconate Cloth 2 % PADS 6 each, 6 each, Topical, Q0600, Penninger, Manuelita, PA   cloNIDine  (CATAPRES ) tablet 0.3 mg, 0.3 mg, Per Tube, Once per day on Sunday Monday Wednesday Friday, Tanda, Tara N, PA-C, 0.3 mg at 06/22/24 9188   cycloSPORINE  (GENGRAF ) 100 MG/ML microemulsion solution 200 mg, 200 mg, Per Tube, QPM, Mavis Purchase, MD, 200 mg at 06/21/24 1735   cycloSPORINE  (GENGRAF )  100 MG/ML microemulsion solution 225 mg, 225 mg, Per Tube, Daily, Mavis Purchase, MD, 225 mg at 06/22/24 0813   Darbepoetin Alfa  (ARANESP ) injection 100 mcg, 100 mcg, Subcutaneous, Q Wed-1800, Mavis Purchase, MD, 100 mcg at 06/18/24 1845   feeding supplement (NEPRO CARB STEADY) liquid 1,000 mL, 1,000 mL, Per Tube, Continuous, Mavis Purchase, MD, Last Rate: 55 mL/hr at 06/21/24 0600, Infusion Verify at 06/21/24 0600   feeding supplement (NEPRO CARB STEADY) liquid 237 mL, 237 mL, Per Tube, PRN, Mavis Purchase, MD   fiber (NUTRISOURCE FIBER) 1 packet, 1 packet, Per Tube, TID, Mavis Purchase, MD, 1 packet at 06/22/24 [phone]   insulin  aspart (novoLOG ) injection 0-9 Units, 0-9 Units, Subcutaneous, Q4H, Mavis Purchase, MD, 3 Units at 06/22/24 1259   labetalol  (NORMODYNE ) injection 20 mg, 20 mg, Intravenous, Q4H PRN, Mavis Purchase, MD, 20 mg at 06/19/24 2338   lacosamide  (VIMPAT ) tablet 100 mg, 100 mg, Per Tube, BID, Mavis Purchase, MD, 100 mg at 06/22/24 9188   midodrine  (PROAMATINE ) tablet 10 mg, 10 mg, Per Tube, TID PRN, Mavis Purchase, MD, 10 mg at 06/20/24 2328   multivitamin with minerals tablet 1 tablet, 1 tablet, Per Tube, Daily, Mavis Purchase, MD, 1 tablet at 06/22/24 9188   mycophenolate  (CELLCEPT ) oral suspension 200 mg/mL, 500 mg, Per Tube, BID, Mavis Purchase, MD, 500 mg at 06/22/24 9187   Oral care mouth rinse, 15 mL, Mouth Rinse, 4 times per day, Mavis Purchase, MD, 15 mL at 06/22/24 1259   Oral care mouth rinse, 15 mL, Mouth Rinse, PRN, Mavis Purchase, MD   oxidized cellulose (Surgicel) pad 1 each, 1 each, Topical, Once, Bergman, Meghan D, NP   pantoprazole  (PROTONIX ) injection 40 mg, 40 mg, Intravenous, QHS, Mavis Purchase, MD, 40 mg at 06/21/24 2320   phenol (CHLORASEPTIC) mouth spray 1 spray, 1 spray, Mouth/Throat, PRN, Macario Dorothyann HERO, MD   predniSONE  (DELTASONE ) tablet 15 mg, 15 mg, Per Tube, Q breakfast, Mavis Purchase, MD, 15 mg at 06/22/24 9188   sennosides (SENOKOT) 8.8 MG/5ML syrup 5 mL, 5 mL, Per Tube, BID PRN, Mavis Purchase, MD  Patients Current Diet:  Diet Order     None       Precautions / Restrictions Precautions Precautions: Fall Precaution/Restrictions Comments: LUQ PEG Restrictions Weight Bearing Restrictions Per Provider Order: No   Has the patient had 2 or more falls or a fall with injury in the past year? No  Prior Activity Level Community (5-7x/wk): drives, gets out of house ~3 days/week  Prior Functional Level Self Care: Did the patient need help bathing, dressing, using the toilet or eating? Needed some help  Indoor Mobility: Did the patient need assistance with walking from room to room (with or without device)? Unknown (recently using wheelchair)  Stairs: Did the patient need assistance with internal or external stairs (with or  without device)? Unknown (recently using wheelchair)  Functional Cognition: Did the patient need help planning regular tasks such as shopping or remembering to take medications? Needed some help  Patient Information Are you of Hispanic, Latino/a,or Spanish origin?: X. Patient unable to respond, A. No, not of Hispanic, Latino/a, or Spanish origin What is your race?: X. Patient unable to respond, J. Other Asian Do you need or want an interpreter to communicate with a doctor or health care staff?: 9. Unable to respond Patient information obtained via proxy : pt's mother answered questions (pt does not need an interpreter)  Patient's Response To:  Health Literacy and Transportation Is the patient able to respond  to health literacy and transportation needs?: No Health Literacy - How often do you need to have someone help you when you read instructions, pamphlets, or other written material from your doctor or pharmacy?: Patient unable to respond Health Literacy and Transportation obtained via proxy: pt's mother answered questions (pt has not missed any appointments d/t transportation issues)  Home Assistive Devices / Equipment Home Equipment: None  Prior Device Use: Indicate devices/aids used by the patient prior to current illness, exacerbation or injury? Manual wheelchair  Current Functional Level Cognition  Arousal/Alertness: Awake/alert Overall Cognitive Status: Impaired/Different from baseline Orientation Level: Oriented to person, Oriented to time, Disoriented to situation, Disoriented to place Attention: Focused Focused Attention: Impaired Focused Attention Impairment: Verbal basic Executive Function: Initiating Initiating: Impaired Initiating Impairment: Verbal basic    Extremity Assessment (includes Sensation/Coordination)  Upper Extremity Assessment: Defer to OT evaluation RUE Deficits / Details: actively uses for oral care, generally weak. does not follow commands for  MMT LUE Deficits / Details: moves spontaneously against gravity. not following commands for MMT  Lower Extremity Assessment: Generalized weakness, Difficult to assess due to impaired cognition (spontaneous movement with no movement to command, L>R instability noted in standing with no buckling)    ADLs  Overall ADL's : Needs assistance/impaired Eating/Feeding: NPO Grooming: Minimal assistance, Sitting Grooming Details (indicate cue type and reason): with max set-up pt self initiated oral care Upper Body Bathing: Maximal assistance, Total assistance, Sitting Lower Body Bathing: Total assistance, +2 for physical assistance, +2 for safety/equipment, Sit to/from stand Upper Body Dressing : Maximal assistance, Total assistance Lower Body Dressing: Maximal assistance, +2 for physical assistance, Total assistance, +2 for safety/equipment, Sit to/from stand Toilet Transfer: Maximal assistance, +2 for physical assistance, +2 for safety/equipment Toilet Transfer Details (indicate cue type and reason): STS x 2 Functional mobility during ADLs: Maximal assistance, +2 for physical assistance, +2 for safety/equipment    Mobility  Overal bed mobility: Needs Assistance Bed Mobility: Supine to Sit, Sit to Supine Supine to sit: Total assist, +2 for physical assistance, +2 for safety/equipment Sit to supine: Total assist, +2 for physical assistance, +2 for safety/equipment General bed mobility comments: pt initiation on behalf of pt    Transfers  Overall transfer level: Needs assistance Equipment used: 2 person hand held assist Transfers: Sit to/from Stand Sit to Stand: Max assist, +2 physical assistance, +2 safety/equipment General transfer comment: Assist for power up with hip unable to reach full hip or knee extension. L knee blocked with instability noted, however, no buckling    Ambulation / Gait / Stairs / Wheelchair Mobility  Ambulation/Gait General Gait Details: unable this date    Posture /  Balance Dynamic Sitting Balance Sitting balance - Comments: CGA-min A statically Balance Overall balance assessment: Needs assistance Sitting-balance support: Single extremity supported, Bilateral upper extremity supported, Feet supported Sitting balance-Leahy Scale: Poor Sitting balance - Comments: CGA-min A statically Standing balance support: Bilateral upper extremity supported, During functional activity Standing balance-Leahy Scale: Zero Standing balance comment: +2 A    Special considerations/life events  Skin Erythema/Redness: sacrum/lower; Stage 1 Pressure Injury: buttocks/bilateral; Surgical Incision, Bowel Incontinence, GJ tube  and Diabetic management Novolog  0-9 units every 4 hours   Previous Home Environment (from acute therapy documentation) Living Arrangements: Alone  Lives With: Alone Available Help at Discharge: Family, Available 24 hours/day, Other (Comment) (patient's mother has come from [place] to help care for her) Type of Home: Apartment Home Layout: One level Home Access: Level entry Bathroom Shower/Tub: Engineer, manufacturing systems: Standard  Bathroom Accessibility: Yes How Accessible: Accessible via walker Home Care Services: No  Discharge Living Setting Plans for Discharge Living Setting: Patient's home Type of Home at Discharge: Apartment Discharge Home Layout: One level Discharge Home Access: Level entry Discharge Bathroom Shower/Tub: Tub/shower unit Discharge Bathroom Toilet: Standard Discharge Bathroom Accessibility: Yes How Accessible: Accessible via walker Does the patient have any problems obtaining your medications?: No  Social/Family/Support Systems Anticipated Caregiver: Almetta LABOR (mother) Anticipated Caregiver's Contact Information: [phone] Caregiver Availability: 24/7 Discharge Plan Discussed with Primary Caregiver: Yes Is Caregiver In Agreement with Plan?: Yes Does Caregiver/Family have Issues with Lodging/Transportation while Pt is  in Rehab?: No  Goals Patient/Family Goal for Rehab: *** Expected length of stay: *** Pt/Family Agrees to Admission and willing to participate: Yes Program Orientation Provided & Reviewed with Pt/Caregiver Including Roles  & Responsibilities: Yes  Decrease burden of Care through IP rehab admission: NA  Possible need for SNF placement upon discharge: Not anticipated  Patient Condition: {PATIENT'S CONDITION:22832}  Preadmission Screen Completed By:  Tinnie SHAUNNA Yvone Delayne, 06/22/2024 2:34 PM ______________________________________________________________________   Discussed status with Dr. PIERRETTE on *** at *** and received approval for admission today.  Admission Coordinator:  Tinnie SHAUNNA Yvone Delayne, CCC-SLP, time ***/Date ***   Assessment/Plan: Diagnosis: *** Does the need for close, 24 hr/day Medical supervision in concert with the patient's rehab needs make it unreasonable for this patient to be served in a less intensive setting? {yes_no_potentially:3041433} Co-Morbidities requiring supervision/potential complications: *** Due to {due un:6958565}, does the patient require 24 hr/day rehab nursing? {yes_no_potentially:3041433} Does the patient require coordinated care of a physician, rehab nurse, PT, OT, and SLP to address physical and functional deficits in the context of the above medical diagnosis(es)? {yes_no_potentially:3041433} Addressing deficits in the following areas: {deficits:3041436} Can the patient actively participate in an intensive therapy program of at least 3 hrs of therapy 5 days a week? {yes_no_potentially:3041433} The potential for patient to make measurable gains while on inpatient rehab is {potential:3041437} Anticipated functional outcomes upon discharge from inpatient rehab: {functional outcomes:304600100} PT, {functional outcomes:304600100} OT, {functional outcomes:304600100} SLP Estimated rehab length of stay to reach the above functional goals is: *** Anticipated  discharge destination: {anticipated dc setting:21604} 10. Overall Rehab/Functional Prognosis: {potential:3041437}   MD Signature: ***

## 2024-06-22 NOTE — Progress Notes (Signed)
   Providing Compassionate, Quality Care - Together   Subjective: Patient resting comfortably. Patient's mother is at the bedside.  Objective: Vital signs in last 24 hours: Temp:  [97.6 F (36.4 C)-99 F (37.2 C)] 98 F (36.7 C) (10/19 0736) Pulse Rate:  [91-99] 98 (10/19 0736) Resp:  [19] 19 (10/19 0736) BP: (138-155)/(45-78) 142/62 (10/19 0736) SpO2:  [95 %-97 %] 97 % (10/19 0736) Weight:  [53.3 kg] 53.3 kg (10/19 0500)  Intake/Output from previous day: 10/18 0701 - 10/19 0700 In: -  Out: 30 [Emesis/NG output:30] Intake/Output this shift: No intake/output data recorded.  Alert PERRLA No speech MAE Incision is covered with gauze dressing. Small amount of dried sanguinous drainage. Dressing changed during assessment. Site is clean, dry, and intact. Closed with staples.  Lab Results: Recent Labs    06/21/24 0505 06/22/24 0644  WBC 5.3 5.1  HGB 9.6* 9.1*  HCT 29.7* 28.1*  PLT 167 137*   BMET Recent Labs    06/21/24 0505 06/22/24 0644  NA 133* 135  K 3.6 3.4*  CL 90* 88*  CO2 26 29  GLUCOSE 140* 141*  BUN 30* 66*  CREATININE 2.24* 4.10*  CALCIUM  10.7* 11.1*    Studies/Results: No results found.  Assessment/Plan: Patient underwent placement of VP shunt by Dr. Mavis on 06/18/2024. Her follow up scan showed improvement of ventriculomegaly.   LOS: 34 days   -Continue supportive efforts.  I am in communication with my attending and they agree with the plan for this patient.   Gerard Beck, DNP, AGNP-C Nurse Practitioner  [facility] [address], [address], [place], [name] [address] P: [phone]    F: [phone]  06/22/2024, 9:31 AM

## 2024-06-22 NOTE — Progress Notes (Signed)
 Progress Note   Patient: [name] [name] FMW:[id number] DOB: 04-29-1981 DOA: 05/19/2024     34 DOS: the patient was seen and examined on 06/22/2024    POD#3 s/p shunt placement   Brief [name] course: [name] with PMH HTN, ischemic and hemorrhagic CVA, type 1 diabetes, gastroparesis, kidney and pancreas transplant 2014, subsequent failure of renal transplant, now ESRD on dialysis, anxiety, narcotic abuse.  Presented to the [name] from dialysis as a code stroke on 9/15; subsequently found to have hemorrhagic stroke.  Neurology, neurosurgery, PCCM consulted.  EVD was placed on the left side.  Patient admitted to neuro ICU.  She was intubated on admission.  She had prolonged ICU stay with EVD drain which was changed to VP shunt on 10/15.  Extubated on 9/23.  Transferred to [name] service on 10/17.    She was recently discharged from here in August after she was managed for ischemic/hemorrhagic stroke and was discharged to LTAC.    Important events:  9/15 admitted with ICH plus IVH, EVD was placed for hydrocephalus, intubated 9/22 palliative care meeting.  Family is insistent that they want full scope of care 9/23 Extubated 10/1 -developed fevers pancultures drawn and broad-spectrum antibiotics started 10/2 became afebrile after starting antibiotics, did not tolerate raising EVD 10/6 EVD was raised from 10 to 30 cm water , tolerating well, remained afebrile 10/7 EVD is at 30 cm water , CT scan will be done tomorrow morning, no overnight issues 10/8 she became somnolent, CT head showed increasing hydrocephalus, EVD was titrated down to 10 cm of water  with improvement in mental status 10/15 VP shunt placement 10/17 transferred to progressive unit with [name]   Assessment and Plan: Principal Problem:   Hemorrhagic stroke [name] [name]) Active Problems:   Moderate protein-calorie malnutrition   Pressure injury of skin   Nontraumatic subcortical hemorrhage of right cerebral  hemisphere [name])   Acute right parietotemporal intraparenchymal hemorrhage/intraventricular hemorrhage Uncontrolled hypertension Obstructive hydrocephalus s/p EVD changed to VP shunt Seizure disorder Currently POD #3 s/p VP shunt placement.  Remains stable on Vimpat  100 mg twice daily.   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  Dialyzed on MWF schedule.  Has AV fistula on the right upper extremity.   Acute bronchitis CXR-Subtle hazy perihilar prominence which may be due to subtle vascular congestion or acute bronchitic process Clinically, there is a suspicion for bronchitis, suspect viral etiology, afebrile, no leucocytosis.    Intermittent low-grade fever: Thought to be secondary to intracranial hemorrhage.  Previous blood cultures showed contamination.  S/p antibiotics for respiratory cultures positive for Serratia and Klebsiella.  Repeat cultures negative thus far.  History of pancreatic transplantation: On cyclosporine , mycophenolate , prednisone    Hypertension:  Slightly elevated today 140s/70s, however yesterday was just borderline hypotensive.  Will continue with antihypertensives Norvasc , clonidine , Coreg , hydralazine  on non-HD days only.   Anemia of chronic disease: No signs of active bleeding.  Hemoglobin stable   Type 1 diabetes with vasculopathy/gastroparesis: Not on long-term insulin  anymore due to pancreatic transplant.  Monitor blood sugars.  On sliding scale.   Moderate protein calorie malnutrition: Has PEG tube.  Continue tube feeds   Stage I sacral pressure ulcer present on admission: Continue wound care   Debility/deconditioning/disposition/goals of care: Young patient with multiple comorbidities.  Palliative care consulted for goals of care conversations, family insist she remains full code.  Palliative has signed off 10/17.  May end up with prolonged hospitalization.  May need to go  back to LTAC once  she is ready.  PT/OT consulted    Subjective:  Patient sleeping  but arousal.  Mother at bedside states patient has been coughing and had an episode of vomiting subsequently. CXR was obtained this morning.[name]  Physical Exam: BP (!) 142/62 (BP Location: Left Arm)   Pulse 98   Temp 98 F (36.7 C) (Oral)   Resp 19   Ht 5' 3 (1.6 m)   Wt 53.3 kg   SpO2 97%   BMI 20.82 kg/m    General appearance: Sleeping comfortably on exam, mother at bedside, no distress HEENT: Dressing in place on right side with no further drainage past markings Cor: RRR, without murmurs, rubs. no LE edema Resp: Normal respiratory rate and rhythm.  CTAB without rales or wheezes. Abd:  No TTP or rebound all quadrants.   Skin: Skin intact without significant rashes or lesions. Neuro: Unable to assess given sleeping soundly Psych: Unable to assess given sleeping soundly  Data Reviewed: CBC stable from 10/17 BMP Phosphorus improved from 10/17 CT head without contrast 10/16 showing improved decompression of right lateral ventricle, partial decompression left lateral ventricle, residual intraparenchymal hemorrhage, encephalomalacia changes  Recent Labs  Lab 06/18/24 0353 06/18/24 1937 06/20/24 0526 06/21/24 0505 06/22/24 0644  NA 131*   < > 135 133* 135  K 4.9   < > 3.7 3.6 3.4*  CO2 26   < > 26 26 29   BUN 57*   < > 44* 30* 66*  CREATININE 3.56*   < > 3.24* 2.24* 4.10*  MG 3.1*  --   --   --   --   WBC 4.0   < > 5.1 5.3 5.1  HGB 9.3*   < > 8.9* 9.6* 9.1*  PLT 177   < > 144* 167 137*   < > = values in this interval not displayed.   Family Communication: Mother at bedside  Disposition: Status is: Inpatient Remains inpatient appropriate because: POD #4 s/p shunt insertion, need for inpatient rehab  Author: Landon FORBES Baller, MD 06/22/2024 9:04 AM  For on call review [email or web address].

## 2024-06-22 NOTE — Progress Notes (Addendum)
 Inpatient Rehab Admissions:  Inpatient Rehab Consult received.  I met with patient and her mother Almetta at the bedside for rehabilitation assessment and to discuss goals and expectations of an inpatient rehab admission. Pt appeared to be asleep and did not participate in the conversation. Discussed average length of stay, insurance authorization requirement and discharge home after completion of CIR. Almetta acknowledged understanding and is supportive of pt pursuing CIR. Almetta confirmed that she will be able to provide support for pt after discharge. Will ask for a physiatrist consult. Will continue to follow.    Signed: Tinnie Yvone Cohens, MS, CCC-SLP Admissions Coordinator [phone]

## 2024-06-22 NOTE — Plan of Care (Signed)
  Problem: Education: Goal: Ability to describe self-care measures that may prevent or decrease complications (Diabetes Survival Skills Education) will improve Outcome: Progressing Goal: Individualized Educational Video(s) Outcome: Progressing   Problem: Coping: Goal: Ability to adjust to condition or change in health will improve Outcome: Progressing   Problem: Fluid Volume: Goal: Ability to maintain a balanced intake and output will improve Outcome: Progressing   Problem: Health Behavior/Discharge Planning: Goal: Ability to identify and utilize available resources and services will improve Outcome: Progressing Goal: Ability to manage health-related needs will improve Outcome: Progressing   Problem: Metabolic: Goal: Ability to maintain appropriate glucose levels will improve Outcome: Progressing   Problem: Nutritional: Goal: Maintenance of adequate nutrition will improve Outcome: Progressing Goal: Progress toward achieving an optimal weight will improve Outcome: Progressing   Problem: Skin Integrity: Goal: Risk for impaired skin integrity will decrease Outcome: Progressing   Problem: Tissue Perfusion: Goal: Adequacy of tissue perfusion will improve Outcome: Progressing   Problem: Education: Goal: Knowledge of disease or condition will improve Outcome: Progressing Goal: Knowledge of secondary prevention will improve (MUST DOCUMENT ALL) Outcome: Progressing Goal: Knowledge of patient specific risk factors will improve (DELETE if not current risk factor) Outcome: Progressing   Problem: Intracerebral Hemorrhage Tissue Perfusion: Goal: Complications of Intracerebral Hemorrhage will be minimized Outcome: Progressing   Problem: Coping: Goal: Will verbalize positive feelings about self Outcome: Progressing Goal: Will identify appropriate support needs Outcome: Progressing   Problem: Health Behavior/Discharge Planning: Goal: Ability to manage health-related needs will  improve Outcome: Progressing Goal: Goals will be collaboratively established with patient/family Outcome: Progressing   Problem: Self-Care: Goal: Ability to participate in self-care as condition permits will improve Outcome: Progressing Goal: Verbalization of feelings and concerns over difficulty with self-care will improve Outcome: Progressing Goal: Ability to communicate needs accurately will improve Outcome: Progressing   Problem: Nutrition: Goal: Risk of aspiration will decrease Outcome: Progressing Goal: Dietary intake will improve Outcome: Progressing   Problem: Education: Goal: Knowledge of General Education information will improve Description: Including pain rating scale, medication(s)/side effects and non-pharmacologic comfort measures Outcome: Progressing   Problem: Health Behavior/Discharge Planning: Goal: Ability to manage health-related needs will improve Outcome: Progressing   Problem: Clinical Measurements: Goal: Ability to maintain clinical measurements within normal limits will improve Outcome: Progressing Goal: Will remain free from infection Outcome: Progressing Goal: Diagnostic test results will improve Outcome: Progressing Goal: Respiratory complications will improve Outcome: Progressing Goal: Cardiovascular complication will be avoided Outcome: Progressing   Problem: Activity: Goal: Risk for activity intolerance will decrease Outcome: Progressing   Problem: Nutrition: Goal: Adequate nutrition will be maintained Outcome: Progressing   Problem: Coping: Goal: Level of anxiety will decrease Outcome: Progressing   Problem: Elimination: Goal: Will not experience complications related to bowel motility Outcome: Progressing   Problem: Pain Managment: Goal: General experience of comfort will improve and/or be controlled Outcome: Progressing   Problem: Safety: Goal: Ability to remain free from injury will improve Outcome: Progressing    Problem: Skin Integrity: Goal: Risk for impaired skin integrity will decrease Outcome: Progressing   Problem: Education: Goal: Knowledge of the prescribed therapeutic regimen will improve Outcome: Progressing   Problem: Clinical Measurements: Goal: Usual level of consciousness will be regained or maintained. Outcome: Progressing Goal: Neurologic status will improve Outcome: Progressing Goal: Ability to maintain intracranial pressure will improve Outcome: Progressing   Problem: Skin Integrity: Goal: Demonstration of wound healing without infection will improve Outcome: Progressing

## 2024-06-22 NOTE — Plan of Care (Signed)
  Problem: Skin Integrity: Goal: Risk for impaired skin integrity will decrease Outcome: Progressing   Problem: Tissue Perfusion: Goal: Adequacy of tissue perfusion will improve Outcome: Progressing   Problem: Education: Goal: Ability to describe self-care measures that may prevent or decrease complications (Diabetes Survival Skills Education) will improve Outcome: Not Progressing   Problem: Coping: Goal: Ability to adjust to condition or change in health will improve Outcome: Not Progressing   Problem: Education: Goal: Knowledge of disease or condition will improve Outcome: Not Progressing Goal: Knowledge of secondary prevention will improve (MUST DOCUMENT ALL) Outcome: Not Progressing Goal: Knowledge of patient specific risk factors will improve (DELETE if not current risk factor) Outcome: Not Progressing   Problem: Intracerebral Hemorrhage Tissue Perfusion: Goal: Complications of Intracerebral Hemorrhage will be minimized Outcome: Not Progressing

## 2024-06-23 DIAGNOSIS — I619 Nontraumatic intracerebral hemorrhage, unspecified: Secondary | ICD-10-CM | POA: Diagnosis not present

## 2024-06-23 LAB — CBC
HCT: 27.9 % — ABNORMAL LOW (ref 36.0–46.0)
Hemoglobin: 8.9 g/dL — ABNORMAL LOW (ref 12.0–15.0)
MCH: 31.1 pg (ref 26.0–34.0)
MCHC: 31.9 g/dL (ref 30.0–36.0)
MCV: 97.6 fL (ref 80.0–100.0)
Platelets: 124 K/uL — ABNORMAL LOW (ref 150–400)
RBC: 2.86 MIL/uL — ABNORMAL LOW (ref 3.87–5.11)
RDW: 14.3 % (ref 11.5–15.5)
WBC: 4.1 K/uL (ref 4.0–10.5)
nRBC: 0 % (ref 0.0–0.2)

## 2024-06-23 LAB — GLUCOSE, CAPILLARY
Glucose-Capillary: 159 mg/dL — ABNORMAL HIGH (ref 70–99)
Glucose-Capillary: 149 mg/dL — ABNORMAL HIGH (ref 70–99)
Glucose-Capillary: 147 mg/dL — ABNORMAL HIGH (ref 70–99)

## 2024-06-23 LAB — BASIC METABOLIC PANEL WITH GFR
Anion gap: 18 — ABNORMAL HIGH (ref 5–15)
BUN: 97 mg/dL — ABNORMAL HIGH (ref 6–20)
CO2: 26 mmol/L (ref 22–32)
Calcium: 10.9 mg/dL — ABNORMAL HIGH (ref 8.9–10.3)
Chloride: 88 mmol/L — ABNORMAL LOW (ref 98–111)
Creatinine, Ser: 5.36 mg/dL — ABNORMAL HIGH (ref 0.44–1.00)
GFR, Estimated: 10 mL/min — ABNORMAL LOW (ref >60)
Glucose, Bld: 146 mg/dL — ABNORMAL HIGH (ref 70–99)
Potassium: 4 mmol/L (ref 3.5–5.1)
Sodium: 132 mmol/L — ABNORMAL LOW (ref 135–145)

## 2024-06-23 LAB — PHOSPHORUS: Phosphorus: 4.6 mg/dL (ref 2.5–4.6)

## 2024-06-23 MED ORDER — PENTAFLUOROPROP-TETRAFLUOROETH EX AERO: 1.0000 | INHALATION_SPRAY | CUTANEOUS | Status: DC | PRN

## 2024-06-23 NOTE — Plan of Care (Signed)
  Problem: Education: Goal: Ability to describe self-care measures that may prevent or decrease complications (Diabetes Survival Skills Education) will improve Outcome: Progressing   Problem: Fluid Volume: Goal: Ability to maintain a balanced intake and output will improve Outcome: Progressing   Problem: Health Behavior/Discharge Planning: Goal: Ability to manage health-related needs will improve Outcome: Progressing

## 2024-06-23 NOTE — Progress Notes (Addendum)
 0800: Pt off unit to HD  1400: report received, pt on her way back to the unit.  1410: Pt back to the unit. VSS. No bleeding at fistula site

## 2024-06-23 NOTE — Progress Notes (Deleted)
 [name] [name] [id number] 1981-04-10   Chief Complaint:  Referring Provider: Center, [facility] MD: Sampson  HPI: [name] [name] is a 43 y.o. female with past medical history of anemia, anxiety, type 1 diabetes, ESRD on hemodialysis, gastroparesis, history of simultaneous kidney and pancreas transplant 2014, HTN, stroke who presents today for a complaint of *** .    Currently admitted?    Discussed the use of AI scribe software for clinical note transcription with the patient, who gave verbal consent to proceed.  History of Present Illness       Previous GI Procedures/Imaging   EGD 12/01/2021 (Dr. Legrand, inpatient) - Normal larynx. - Normal esophagus.  - Gastritis.  - Erythematous duodenopathy.  - Several biopsies were obtained on the greater curvature of the gastric body, on the lesser curvature of the gastric body, on the greater curvature of the gastric antrum and on the lesser curvature of the gastric antrum. Path: A. STOMACH, RANDOM, BIOPSY:  Reactive gastropathy with foveolar hyperplasia and minimal chronic  gastritis  Negative for H. pylori, intestinal metaplasia, dysplasia and carcinoma   Past Medical History:  Diagnosis Date   Anemia    Anxiety    DM (diabetes mellitus), type 1 (HCC)    ESRD on hemodialysis (HCC)    Gastroparesis    History of simultaneous kidney and pancreas transplant (HCC) 2014   Hypertension    Ischemic cerebrovascular accident (CVA) (HCC) 11/2021   Narcotic abuse (HCC)    Non compliance with medical treatment    Stroke [facility])    Vision loss     Past Surgical History:  Procedure Laterality Date   A/V FISTULAGRAM Right 02/17/2021   Procedure: A/V FISTULAGRAM;  Surgeon: Gretta Lonni PARAS, MD;  Location: [facility] INVASIVE CV LAB;  Service: Cardiovascular;  Laterality: Right;   AV FISTULA PLACEMENT  11/17/2011   Procedure: ARTERIOVENOUS (AV) FISTULA CREATION;  Surgeon: Krystal JULIANNA Doing, MD;  Location: [facility] OR;   Service: Vascular;  Laterality: Right;   BIOPSY  12/03/2021   Procedure: BIOPSY;  Surgeon: Legrand Victory LITTIE DOUGLAS, MD;  Location: [facility] ENDOSCOPY;  Service: Gastroenterology;;   ESOPHAGOGASTRODUODENOSCOPY (EGD) WITH PROPOFOL  N/A 12/03/2021   Procedure: ESOPHAGOGASTRODUODENOSCOPY (EGD) WITH PROPOFOL ;  Surgeon: Legrand Victory LITTIE DOUGLAS, MD;  Location: [facility] ENDOSCOPY;  Service: Gastroenterology;  Laterality: N/A;   EYE SURGERY     HEMATOMA EVACUATION Right 02/25/2021   Procedure: EVACUATION HEMATOMA RIGHT CHEST;  Surgeon: Sheree Penne Lonni, MD;  Location: [facility] OR;  Service: Vascular;  Laterality: Right;   INSERTION OF DIALYSIS CATHETER Right 12/08/2020   Procedure: INSERTION OF TUNNELED DIALYSIS CATHETER;  Surgeon: Gretta Lonni PARAS, MD;  Location: [facility] OR;  Service: Vascular;  Laterality: Right;   IR ANGIO INTRA EXTRACRAN SEL INTERNAL CAROTID BILAT MOD SED  02/27/2024   IR ANGIO VERTEBRAL SEL VERTEBRAL UNI R MOD SED  02/27/2024   IR GASTROSTOMY TUBE MOD SED  03/20/2024   IR GJ TUBE CHANGE  04/15/2024   IR GJ TUBE CHANGE  05/21/2024   IR REMOVAL TUN CV CATH W/O FL  05/03/2021   KIDNEY TRANSPLANT     LAPAROSCOPIC REVISION VENTRICULAR-PERITONEAL (V-P) SHUNT N/A 06/18/2024   Procedure: REVISION, PROCEDURE INVOLVING VENTRICULOPERITONEAL SHUNT, LAPAROSCOPIC;  Surgeon: Vernetta Berg, MD;  Location: [facility] OR;  Service: General;  Laterality: N/A;   NEPHRECTOMY TRANSPLANTED ORGAN     pancrease transplant     PERIPHERAL VASCULAR BALLOON ANGIOPLASTY Right 02/17/2021   Procedure: PERIPHERAL VASCULAR BALLOON ANGIOPLASTY;  Surgeon: Gretta Lonni PARAS,  MD;  Location: [facility] INVASIVE CV LAB;  Service: Cardiovascular;  Laterality: Right;   REFRACTIVE SURGERY     REVISON OF ARTERIOVENOUS FISTULA Right 12/08/2020   Procedure: RIGHT ARM ARTERIOVENOUS FISTULA REVISION AND RESECTION;  Surgeon: Gretta Lonni PARAS, MD;  Location: [facility] OR;  Service: Vascular;  Laterality: Right;   REVISON OF ARTERIOVENOUS FISTULA Right 02/25/2021   Procedure:  RIGHT ARM ARTERIOVENOUS FISTULA REVISION WITH TRANSPOSITION OF CEPHALIC VEIN ON AXILLARY VEIN;  Surgeon: Gretta Lonni PARAS, MD;  Location: [facility] OR;  Service: Vascular;  Laterality: Right;   VENTRICULOPERITONEAL SHUNT N/A 06/18/2024   Procedure: SHUNT INSERTION VENTRICULAR-PERITONEAL;  Surgeon: Mavis Purchase, MD;  Location: [facility] OR;  Service: Neurosurgery;  Laterality: N/A;  VP SHUNT    No current facility-administered medications for this visit.   No current outpatient medications on file.   Facility-Administered Medications Ordered in Other Visits  Medication Dose Route Frequency Provider Last Rate Last Admin   acetaminophen  (TYLENOL ) tablet 650 mg  650 mg Per Tube Q4H PRN Mavis Purchase, MD   650 mg at 06/22/24 2350   Or   acetaminophen  (TYLENOL ) 160 MG/5ML solution 650 mg  650 mg Per Tube Q4H PRN Mavis Purchase, MD   650 mg at 06/19/24 0302   Or   acetaminophen  (TYLENOL ) suppository 650 mg  650 mg Rectal Q4H PRN Mavis Purchase, MD       carvedilol  (COREG ) tablet 25 mg  25 mg Per Tube 2 times per day on Sunday Monday Wednesday Friday Tanda Rexene SAILOR, PA-C   25 mg at 06/22/24 2350   Chlorhexidine  Gluconate Cloth 2 % PADS 6 each  6 each Topical Q0600 Penninger, Manuelita, [place]   6 each at 06/23/24 9340   cloNIDine  (CATAPRES ) tablet 0.3 mg  0.3 mg Per Tube Once per day on Sunday Monday Wednesday Friday Tanda Rexene SAILOR, PA-C   0.3 mg at 06/22/24 9188   cycloSPORINE  (GENGRAF ) 100 MG/ML microemulsion solution 200 mg  200 mg Per Tube QPM Mavis Purchase, MD   200 mg at 06/23/24 1643   cycloSPORINE  (GENGRAF ) 100 MG/ML microemulsion solution 225 mg  225 mg Per Tube Daily Mavis Purchase, MD   225 mg at 06/22/24 0813   Darbepoetin Alfa  (ARANESP ) injection 100 mcg  100 mcg Subcutaneous Q Wed-1800 Jenkins, Jeffrey, MD   100 mcg at 06/18/24 1845   feeding supplement (NEPRO CARB STEADY) liquid 1,000 mL  1,000 mL Per Tube Continuous Jenkins, Jeffrey, MD 55 mL/hr at 06/23/24 1454 1,000 mL at 06/23/24  1454   feeding supplement (NEPRO CARB STEADY) liquid 237 mL  237 mL Per Tube PRN Mavis Purchase, MD       fiber (NUTRISOURCE FIBER) 1 packet  1 packet Per Tube TID Mavis Purchase, MD   1 packet at 06/23/24 1453   insulin  aspart (novoLOG ) injection 0-9 Units  0-9 Units Subcutaneous Q4H Mavis Purchase, MD   1 Units at 06/23/24 1642   labetalol  (NORMODYNE ) injection 20 mg  20 mg Intravenous Q4H PRN Mavis Purchase, MD   20 mg at 06/19/24 2338   lacosamide  (VIMPAT ) tablet 100 mg  100 mg Per Tube BID Mavis Purchase, MD   100 mg at 06/23/24 1453   midodrine  (PROAMATINE ) tablet 10 mg  10 mg Per Tube TID PRN Mavis Purchase, MD   10 mg at 06/20/24 2328   multivitamin with minerals tablet 1 tablet  1 tablet Per Tube Daily Mavis Purchase, MD   1 tablet at 06/22/24 9188   mycophenolate  (CELLCEPT ) oral suspension 200  mg/mL  500 mg Per Tube BID Mavis Purchase, MD   500 mg at 06/23/24 1453   Oral care mouth rinse  15 mL Mouth Rinse 4 times per day Mavis Purchase, MD   15 mL at 06/23/24 1648   Oral care mouth rinse  15 mL Mouth Rinse PRN Mavis Purchase, MD       oxidized cellulose (Surgicel) pad 1 each  1 each Topical Once Bergman, Meghan D, NP       pantoprazole  (PROTONIX ) injection 40 mg  40 mg Intravenous QHS Mavis Purchase, MD   40 mg at 06/22/24 2351   phenol (CHLORASEPTIC) mouth spray 1 spray  1 spray Mouth/Throat PRN Macario Dorothyann HERO, MD       predniSONE  (DELTASONE ) tablet 15 mg  15 mg Per Tube Q breakfast Mavis Purchase, MD   15 mg at 06/22/24 9188   sennosides (SENOKOT) 8.8 MG/5ML syrup 5 mL  5 mL Per Tube BID PRN Mavis Purchase, MD        Allergies as of 06/24/2024   (No Known Allergies)    Family History  Problem Relation Age of Onset   Hypertension Father     Social History   Tobacco Use   Smoking status: Former    Types: Pipe   Smokeless tobacco: Never   Tobacco comments:    Hooka  Vaping Use   Vaping status: Never Used  Substance Use Topics   Alcohol  use:  No   Drug use: No     Review of Systems:    Constitutional: No weight loss, fever, chills, weakness or fatigue Eyes: No change in vision Ears, Nose, Throat:  No change in hearing or congestion Skin: No rash or itching Cardiovascular: No chest pain, chest pressure or palpitations   Respiratory: No SOB or cough Gastrointestinal: See HPI and otherwise negative Genitourinary: No dysuria or change in urinary frequency Neurological: No headache, dizziness or syncope Musculoskeletal: No new muscle or joint pain Hematologic: No bleeding or bruising    Physical Exam:  Vital signs: There were no vitals taken for this visit.  Constitutional: NAD, Well developed, Well nourished, alert and cooperative Head:  Normocephalic and atraumatic.  Eyes: No scleral icterus. Conjunctiva pink. Mouth: No oral lesions. Respiratory: Respirations even and unlabored. Lungs clear to auscultation bilaterally.  No wheezes, crackles, or rhonchi.  Cardiovascular:  Regular rate and rhythm. No murmurs. No peripheral edema. Gastrointestinal:  Soft, nondistended, nontender. No rebound or guarding. Normal bowel sounds. No appreciable masses or hepatomegaly. Rectal:  Not performed.  Neurologic:  Alert and oriented x4;  grossly normal neurologically.  Skin:   Dry and intact without significant lesions or rashes. Psychiatric: Oriented to person, place and time. Demonstrates good judgement and reason without abnormal affect or behaviors.   RELEVANT LABS AND IMAGING: CBC    Component Value Date/Time   WBC 4.1 06/23/2024 0600   RBC 2.86 (L) 06/23/2024 0600   HGB 8.9 (L) 06/23/2024 0600   HCT 27.9 (L) 06/23/2024 0600   PLT 124 (L) 06/23/2024 0600   MCV 97.6 06/23/2024 0600   MCH 31.1 06/23/2024 0600   MCHC 31.9 06/23/2024 0600   RDW 14.3 06/23/2024 0600   LYMPHSABS 0.9 06/12/2024 0442   MONOABS 0.4 06/12/2024 0442   EOSABS 0.1 06/12/2024 0442   BASOSABS 0.0 06/12/2024 0442    CMP     Component Value  Date/Time   NA 132 (L) 06/23/2024 0600   K 4.0 06/23/2024 0600   CL 88 (L) 06/23/2024 0600  CO2 26 06/23/2024 0600   GLUCOSE 146 (H) 06/23/2024 0600   BUN 97 (H) 06/23/2024 0600   CREATININE 5.36 (H) 06/23/2024 0600   CALCIUM  10.9 (H) 06/23/2024 0600   PROT 6.5 06/08/2024 0425   ALBUMIN  2.9 (L) 06/11/2024 0840   AST 19 06/08/2024 0425   ALT 13 06/08/2024 0425   ALKPHOS 134 (H) 06/08/2024 0425   BILITOT 0.7 06/08/2024 0425   GFRNONAA 10 (L) 06/23/2024 0600   GFRAA  02/02/2022 9167    PATIENT IDENTIFICATION ERROR. PLEASE DISREGARD RESULTS. ACCOUNT WILL BE CREDITED.   Echocardiogram 03/16/2024 1. Left ventricular ejection fraction, by estimation, is 60 to 65% . The left ventricle has normal function. The left ventricle has no regional wall motion abnormalities. There is mild left ventricular hypertrophy. Left ventricular diastolic parameters are indeterminate.  2. Right ventricular systolic function is normal. The right ventricular size is normal.  3. A small pericardial effusion is present.  4. Trivial mitral valve regurgitation. 5. The aortic valve is tricuspid. Aortic valve regurgitation is not visualized. Aortic valve sclerosis/ calcification is present, without any evidence of aortic stenosis.  6. The inferior vena cava is normal in size with greater than 50% respiratory variability, suggesting right atrial pressure of 3 mmHg.  Assessment/Plan:       Camie Furbish, PA-C [facility] 06/23/2024, 7:57 PM  Patient Care Team: Center, [facility] as PCP - General Tobie, Gordy POUR, MD (Nephrology) Froylan Sharper, MD as Referring Physician (Nephrology) Center, [facility]

## 2024-06-23 NOTE — Plan of Care (Signed)
  Problem: Metabolic: Goal: Ability to maintain appropriate glucose levels will improve Outcome: Progressing   Problem: Skin Integrity: Goal: Risk for impaired skin integrity will decrease Outcome: Progressing   Problem: Health Behavior/Discharge Planning: Goal: Goals will be collaboratively established with patient/family Outcome: Progressing   Problem: Nutrition: Goal: Risk of aspiration will decrease Outcome: Progressing   Problem: Clinical Measurements: Goal: Will remain free from infection Outcome: Progressing   Problem: Education: Goal: Ability to describe self-care measures that may prevent or decrease complications (Diabetes Survival Skills Education) will improve Outcome: Not Progressing   Problem: Education: Goal: Knowledge of disease or condition will improve Outcome: Not Progressing Goal: Knowledge of secondary prevention will improve (MUST DOCUMENT ALL) Outcome: Not Progressing Goal: Knowledge of patient specific risk factors will improve (DELETE if not current risk factor) Outcome: Not Progressing   Problem: Coping: Goal: Will verbalize positive feelings about self Outcome: Not Progressing

## 2024-06-23 NOTE — Progress Notes (Signed)
 PROGRESS NOTE    [name] [name]  FMW:[id number] DOB: 1981-04-04 DOA: 05/19/2024 PCP: [facility]   Brief Narrative:  [name] with PMH HTN, ischemic and hemorrhagic CVA, type 1 diabetes, gastroparesis, kidney and pancreas transplant 2014, subsequent failure of renal transplant, now ESRD on dialysis, anxiety, narcotic abuse.  Presented to the [facility] ED from dialysis as a code stroke on 9/15; subsequently found to have hemorrhagic stroke.  Neurology, neurosurgery, PCCM consulted.  EVD was placed on the left side.  Patient admitted to neuro ICU.  She was intubated on admission.  She had prolonged ICU stay with EVD drain which was changed to VP shunt on 10/15.  Extubated on 9/23.  Transferred to [facility] service on 10/17.     She was recently discharged from here in August after she was managed for ischemic/hemorrhagic stroke and was discharged to LTAC.    Important events:  9/15 admitted with ICH plus IVH, EVD was placed for hydrocephalus, intubated 9/22 palliative care meeting.  Family is insistent that they want full scope of care 9/23 Extubated 10/1 -developed fevers pancultures drawn and broad-spectrum antibiotics started 10/2 became afebrile after starting antibiotics, did not tolerate raising EVD 10/6 EVD was raised from 10 to 30 cm water , tolerating well, remained afebrile 10/7 EVD is at 30 cm water , CT scan will be done tomorrow morning, no overnight issues 10/8 she became somnolent, CT head showed increasing hydrocephalus, EVD was titrated down to 10 cm of water  with improvement in mental status 10/15 VP shunt placement 10/17 transferred to progressive unit with [name]   Assessment & Plan:   Principal Problem:   Hemorrhagic stroke [facility]) Active Problems:   Moderate protein-calorie malnutrition   Pressure injury of skin   Nontraumatic subcortical hemorrhage of right cerebral hemisphere [facility])  Acute right parietotemporal intraparenchymal  hemorrhage/intraventricular hemorrhage Uncontrolled hypertension Obstructive hydrocephalus s/p EVD changed to VP shunt Seizure disorder s/p VP shunt placement 06/18/2024 by Dr. Mavis.  Remains stable on Vimpat  100 mg twice daily.   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  Patient on CellCept  and prednisone .  Dialyzed on MWF schedule.  Has AV fistula on the right upper extremity.    Acute bronchitis CXR-Subtle hazy perihilar prominence which may be due to subtle vascular congestion or acute bronchitic process Clinically, there is a suspicion for bronchitis, suspect viral etiology, afebrile, no leucocytosis.    Intermittent low-grade fever: Thought to be secondary to intracranial hemorrhage.  Previous blood cultures showed contamination.  S/p antibiotics for respiratory cultures positive for Serratia and Klebsiella.  Repeat cultures negative thus far.  Patient afebrile.   History of pancreatic transplantation: On cyclosporine , mycophenolate , prednisone    Hypertension:  Blood pressure mostly under 160 systolic.  Will continue with antihypertensives Norvasc , clonidine , Coreg , hydralazine  on non-HD days only.   Anemia of chronic disease: No signs of active bleeding.  Hemoglobin stable   Type 1 diabetes with vasculopathy/gastroparesis: Not on long-term insulin  anymore due to pancreatic transplant.  Monitor blood sugars.  On sliding scale.   Moderate protein calorie malnutrition: Has PEG tube.  Continue tube feeds   Stage I sacral pressure ulcer present on admission: Continue wound care   Debility/deconditioning/disposition/goals of care: Young patient with multiple comorbidities.  Palliative care consulted for goals of care conversations, family insist she remains full code.  Palliative has signed off 10/17.  May end up with prolonged hospitalization.  May need to go back to LTAC once she is  ready.  PT/OT consulted  DVT  prophylaxis: SCDs Start: 05/19/24 1724   Code Status: Full Code  Family Communication: Mother none present at bedside.   Status is: Inpatient Remains inpatient appropriate because: Medically stable, pending placement, difficult to place.   Estimated body mass index is 21.83 kg/m as calculated from the following:   Height as of this encounter: 5' 3 (1.6 m).   Weight as of this encounter: 55.9 kg.  Wound 05/19/24 1933 Pressure Injury Buttocks Bilateral Stage 1 -  Intact skin with non-blanchable redness of a localized area usually over a bony prominence. (Active)   Nutritional Assessment: Body mass index is 21.83 kg/m.[name] Seen by dietician.  I agree with the assessment and plan as outlined below: Nutrition Status: Nutrition Problem: Moderate Malnutrition Etiology: chronic illness (ESRD on HD/DM and gastroparesis) Signs/Symptoms: severe muscle depletion, mild fat depletion Interventions: Refer to RD note for recommendations  . Skin Assessment: I have examined the patient's skin and I agree with the wound assessment as performed by the wound care RN as outlined below: Wound 05/19/24 1933 Pressure Injury Buttocks Bilateral Stage 1 -  Intact skin with non-blanchable redness of a localized area usually over a bony prominence. (Active)    Consultants:  General surgery, palliative care, nephrology, neurology  Procedures:  As above  Antimicrobials:  Anti-infectives (From admission, onward)    Start     Dose/Rate Route Frequency Ordered Stop   06/18/24 2030  ceFAZolin  (ANCEF ) IVPB 2g/100 mL premix  Status:  Discontinued        2 g 200 mL/hr over 30 Minutes Intravenous Every 8 hours 06/18/24 1454 06/19/24 1608   06/18/24 0600  ceFAZolin  (ANCEF ) IVPB 2g/100 mL premix        2 g 200 mL/hr over 30 Minutes Intravenous On call to O.R. 06/15/24 1152 06/18/24 1230   06/06/24 1400  piperacillin -tazobactam (ZOSYN ) IVPB 2.25 g        2.25 g 100 mL/hr over 30 Minutes Intravenous Every 8 hours  06/06/24 1017 06/09/24 0538   06/06/24 1200  vancomycin  (VANCOREADY) IVPB 500 mg/100 mL        500 mg 100 mL/hr over 60 Minutes Intravenous Every M-W-F (Hemodialysis) 06/04/24 1745 06/06/24 1900   06/04/24 1845  vancomycin  (VANCOCIN ) IVPB 1000 mg/200 mL premix        1,000 mg 200 mL/hr over 60 Minutes Intravenous  Once 06/04/24 1745 06/04/24 1915   06/04/24 1830  ceFEPIme  (MAXIPIME ) 2 g in sodium chloride  0.9 % 100 mL IVPB  Status:  Discontinued        2 g 200 mL/hr over 30 Minutes Intravenous Every M-W-F (Hemodialysis) 06/04/24 1743 06/06/24 1017   05/27/24 1015  cefTRIAXone  (ROCEPHIN ) 2 g in sodium chloride  0.9 % 100 mL IVPB        2 g 200 mL/hr over 30 Minutes Intravenous Every 24 hours 05/27/24 0918 05/29/24 1014   05/23/24 1530  ceFEPIme  (MAXIPIME ) 1 g in sodium chloride  0.9 % 100 mL IVPB  Status:  Discontinued        1 g 200 mL/hr over 30 Minutes Intravenous Every 24 hours 05/23/24 1433 05/27/24 0918   05/22/24 2200  Ampicillin -Sulbactam (UNASYN ) 3 g in sodium chloride  0.9 % 100 mL IVPB  Status:  Discontinued        3 g 200 mL/hr over 30 Minutes Intravenous Every 24 hours 05/22/24 0834 05/23/24 1433   05/22/24 0930  Ampicillin -Sulbactam (UNASYN ) 3 g in sodium chloride  0.9 % 100 mL IVPB  3 g 200 mL/hr over 30 Minutes Intravenous  Once 05/22/24 9165 05/22/24 1900         Subjective: Patient seen and examined in dialysis, she was alert and oriented to self.  She had no complaints but she said she was exhausted.  Was still having weakness in the extremities.  Objective: Vitals:   06/22/24 2335 06/23/24 0410 06/23/24 0523 06/23/24 0735  BP: (!) 145/67  (!) 163/72 (!) 160/71  Pulse: 85  83 85  Resp: 18  20 17   Temp: 98.9 F (37.2 C)  98 F (36.7 C) 98 F (36.7 C)  TempSrc: Axillary  Oral Oral  SpO2: 100%  97% 98%  Weight:  55.9 kg    Height:       No intake or output data in the 24 hours ending 06/23/24 0737 Filed Weights   06/21/24 0500 06/22/24 0500 06/23/24  0410  Weight: 51.5 kg 53.3 kg 55.9 kg    Examination:  General exam: Appears calm and comfortable  Respiratory system: Clear to auscultation. Respiratory effort normal. Cardiovascular system: S1 & S2 heard, RRR. No JVD, murmurs, rubs, gallops or clicks. No pedal edema. Gastrointestinal system: Abdomen is nondistended, soft and nontender. No organomegaly or masses felt. Normal bowel sounds heard. Central nervous system: Alert and oriented x 2.  4/5 power in bilateral lower extremities. Extremities: Symmetric 5 x 5 power. Skin: No rashes, lesions or ulcers  Data Reviewed: I have personally reviewed following labs and imaging studies  CBC: Recent Labs  Lab 06/19/24 0556 06/20/24 0526 06/21/24 0505 06/22/24 0644 06/23/24 0600  WBC 7.5 5.1 5.3 5.1 4.1  HGB 9.3* 8.9* 9.6* 9.1* 8.9*  HCT 28.6* 27.2* 29.7* 28.1* 27.9*  MCV 97.6 96.8 98.0 97.2 97.6  PLT 169 144* 167 137* 124*   Basic Metabolic Panel: Recent Labs  Lab 06/18/24 0353 06/18/24 1937 06/19/24 0556 06/20/24 0526 06/21/24 0505 06/22/24 0644 06/23/24 0600  NA 131*   < > 134* 135 133* 135 132*  K 4.9   < > 4.8 3.7 3.6 3.4* 4.0  CL 87*   < > 92* 90* 90* 88* 88*  CO2 26   < > 23 26 26 29 26   GLUCOSE 106*   < > 115* 135* 140* 141* 146*  BUN 57*   < > 79* 44* 30* 66* 97*  CREATININE 3.56*   < > 5.00* 3.24* 2.24* 4.10* 5.36*  CALCIUM  11.0*   < > 11.3* 11.0* 10.7* 11.1* 10.9*  MG 3.1*  --   --   --   --   --   --   PHOS 5.1*  --  7.1* 4.8* 3.6 4.5 4.6   < > = values in this interval not displayed.   GFR: Estimated Creatinine Clearance: 11.3 mL/min (A) (by C-G formula based on SCr of 5.36 mg/dL (H)). Liver Function Tests: No results for input(s): AST, ALT, ALKPHOS, BILITOT, PROT, ALBUMIN  in the last 168 hours. No results for input(s): LIPASE, AMYLASE in the last 168 hours. No results for input(s): AMMONIA in the last 168 hours. Coagulation Profile: Recent Labs  Lab 06/18/24 0353  INR 1.1    Cardiac Enzymes: No results for input(s): CKTOTAL, CKMB, CKMBINDEX, TROPONINI in the last 168 hours. BNP (last 3 results) No results for input(s): PROBNP in the last 8760 hours. HbA1C: No results for input(s): HGBA1C in the last 72 hours. CBG: Recent Labs  Lab 06/22/24 0737 06/22/24 1254 06/22/24 1636 06/22/24 2033 06/22/24 2337  GLUCAP 128* 217* 192*  145* 152*   Lipid Profile: No results for input(s): CHOL, HDL, LDLCALC, TRIG, CHOLHDL, LDLDIRECT in the last 72 hours. Thyroid Function Tests: No results for input(s): TSH, T4TOTAL, FREET4, T3FREE, THYROIDAB in the last 72 hours. Anemia Panel: No results for input(s): VITAMINB12, FOLATE, FERRITIN, TIBC, IRON , RETICCTPCT in the last 72 hours. Sepsis Labs: No results for input(s): PROCALCITON, LATICACIDVEN in the last 168 hours.  No results found for this or any previous visit (from the past 240 hours).   Radiology Studies: DG Chest Port 1 View Result Date: 06/22/2024 CLINICAL DATA:  Cough. EXAM: PORTABLE CHEST 1 VIEW COMPARISON:  06/04/2024 FINDINGS: Ventriculostomy catheter over the right neck, chest and abdomen. Three skin staples over the right upper chest. Several surgical clips over the lateral right upper chest. Lungs are adequately inflated without airspace consolidation or effusion. Subtle hazy perihilar prominence which may be due to subtle vascular congestion or acute bronchitic process. Cardiomediastinal silhouette and remainder of the exam is unchanged. IMPRESSION: Subtle hazy perihilar prominence which may be due to subtle vascular congestion or acute bronchitic process. Electronically Signed   By: Toribio Agreste M.D.   On: 06/22/2024 10:24    Scheduled Meds:  carvedilol   25 mg Per Tube 2 times per day on Sunday Monday Wednesday Friday   Chlorhexidine  Gluconate Cloth  6 each Topical Q0600   cloNIDine   0.3 mg Per Tube Once per day on Sunday Monday Wednesday Friday    cycloSPORINE   200 mg Per Tube QPM   cycloSPORINE   225 mg Per Tube Daily   darbepoetin (ARANESP ) injection - DIALYSIS  100 mcg Subcutaneous Q Wed-1800   fiber  1 packet Per Tube TID   insulin  aspart  0-9 Units Subcutaneous Q4H   lacosamide   100 mg Per Tube BID   multivitamin with minerals  1 tablet Per Tube Daily   mycophenolate   500 mg Per Tube BID   mouth rinse  15 mL Mouth Rinse 4 times per day   oxidized cellulose  1 each Topical Once   pantoprazole  (PROTONIX ) IV  40 mg Intravenous QHS   predniSONE   15 mg Per Tube Q breakfast   Continuous Infusions:  feeding supplement (NEPRO CARB STEADY) 1,000 mL (06/22/24 1729)     LOS: 35 days   Fredia Skeeter, MD [name]  06/23/2024, 7:37 AM   *Please note that this is a verbal dictation therefore any spelling or grammatical errors are due to the Dragon [employer] One system interpretation.  Please page via Amion and do not message via secure chat for urgent patient care matters. Secure chat can be used for non urgent patient care matters.  How to contact the [facility] Attending or Consulting provider 7A - 7P or covering provider during after hours 7P -7A, for this patient?  Check the care team in [facility] and look for a) attending/consulting [facility] provider listed and b) the [facility] team listed. Page or secure chat 7A-7P. Log into [email or web address] and use [facility] universal password to access. If you do not have the password, please contact the hospital operator. Locate the [facility] provider you are looking for under [name] and page to a number that you can be directly reached. If you still have difficulty reaching the provider, please page the [facility] (Director on Call) for the Hospitalists listed on amion for assistance.

## 2024-06-23 NOTE — Progress Notes (Signed)
 Inpatient Rehab Admissions Coordinator:  Dr. Carilyn did a rehab consult. Recommneding LTACH. AC discussed with pt's mother Almetta. She is in agreement. TOC made aware. AC will sign off.   Tinnie Yvone Cohens, MS, CCC-SLP Admissions Coordinator [phone]

## 2024-06-23 NOTE — Procedures (Signed)
 Patient seen on Hemodialysis. Minimal/delayed verbal responses to conversation.  BP (!) 157/66   Pulse 79   Temp 98 F (36.7 C) (Oral)   Resp (!) 26   Ht 5' 3 (1.6 m)   Wt 52.8 kg   SpO2 100%   BMI 20.62 kg/m   QB 400, UF goal 2.5L Tolerating treatment without problems at this time- earlier, she had pulled out her needles (EBL 300-430mL) and has been cleaned up and placed in restrains for safety.   Gordy Blanch MD [facility]. Office # [phone] Pager # [phone] 10:22 AM

## 2024-06-23 NOTE — Consult Note (Signed)
 Physical Medicine and Rehabilitation Consult Reason for Consult: Intraventricular hemorrhage Referring Physician:  Pahwani    HPI: [name] [name] is a 43 y.o. female with history of type 1 diabetes with gastroparesis, history of kidney and pancreas transplant 2014, history of anxiety and narcotic abuse, end-stage renal disease on hemodialysis who was admitted from the dialysis unit on 05/19/2024 for bilateral weakness bilateral facial droop and lethargy.  Of note is that the patient has had recent admissions from June 11 through March 31, 2024 and July 20 9 through 8/13 2025 for ICH.  After these admissions the patient went to [facility] and that to [facility] She was seen initially by stroke team as well as neurosurgery.  Systolic blood pressure was initially in the 200s.  An extraventricular drain was placed on the left side.  Patient was admitted to the neuro ICU. The patient had a complicated medical course and was followed by critical care medicine, neurology, nephrology, neurosurgery.  Elevated blood pressure was treated with Cleviprex .  Repeat CT scan showed improved ventriculomegaly after EVD placement as well as stable ICH/IVH.  Palliative care talk to family and full efforts were continued.  Had respiratory failure requiring intubation.  G/J tube placement for nutrition during previous admission EVD converted to VP shunt 10/15 Patient developed Serratia and Klebsiella pneumonia. Also developed sepsis on 06/04/2024 treated with Vanco and cefepime  Extubated on 05/27/2024. Transferred to [facility] service 06/20/24 Total assist with OT  Total A x 2 bed mobility Max A x 2 to stand with blocking of the left knee Per SLP lethargic but inconsistent 1 word answers 109/18 Vomiting after cough Home: Home Living Family/patient expects to be discharged to:: Private residence Living Arrangements: Alone Available Help at Discharge: Family, Available 24 hours/day, Other  (Comment) (patient's mother has come from [place] to help care for her) Type of Home: Apartment Home Access: Level entry Home Layout: One level Bathroom Shower/Tub: Engineer, manufacturing systems: Standard Bathroom Accessibility: Yes Home Equipment: None  Lives With: Alone  Functional History: Prior Function Prior Level of Function : Independent/Modified Independent Mobility Comments: Ind with no AD, Ind with ADLs ADLs Comments: Drives to dialysis MWF Functional Status:  Mobility: Bed Mobility Overal bed mobility: Needs Assistance Bed Mobility: Supine to Sit, Sit to Supine Supine to sit: Total assist, +2 for physical assistance, +2 for safety/equipment Sit to supine: Total assist, +2 for physical assistance, +2 for safety/equipment General bed mobility comments: pt initiation on behalf of pt Transfers Overall transfer level: Needs assistance Equipment used: 2 person hand held assist Transfers: Sit to/from Stand Sit to Stand: Max assist, +2 physical assistance, +2 safety/equipment General transfer comment: Assist for power up with hip unable to reach full hip or knee extension. L knee blocked with instability noted, however, no buckling Ambulation/Gait General Gait Details: unable this date    ADL: ADL Overall ADL's : Needs assistance/impaired Eating/Feeding: NPO Grooming: Minimal assistance, Sitting Grooming Details (indicate cue type and reason): with max set-up pt self initiated oral care Upper Body Bathing: Maximal assistance, Total assistance, Sitting Lower Body Bathing: Total assistance, +2 for physical assistance, +2 for safety/equipment, Sit to/from stand Upper Body Dressing : Maximal assistance, Total assistance Lower Body Dressing: Maximal assistance, +2 for physical assistance, Total assistance, +2 for safety/equipment, Sit to/from stand Toilet Transfer: Maximal assistance, +2 for physical assistance, +2 for safety/equipment Toilet Transfer Details (indicate cue  type and reason): STS x 2 Functional mobility during ADLs: Maximal assistance, +2 for physical  assistance, +2 for safety/equipment  Cognition: Cognition Overall Cognitive Status: Impaired/Different from baseline Arousal/Alertness: Awake/alert Orientation Level: Oriented to person, Oriented to place, Disoriented to time, Disoriented to situation Month: May Attention: Focused Focused Attention: Impaired Focused Attention Impairment: Verbal basic Executive Function: Initiating Initiating: Impaired Initiating Impairment: Verbal basic Cognition Arousal: Lethargic, Obtunded Behavior During Therapy: Flat affect Overall Cognitive Status: Impaired/Different from baseline   Review of Systems  Unable to perform ROS: Mental acuity  Constitutional:  Negative for chills and fever.  HENT:  Negative for nosebleeds.   Eyes:  Negative for redness.  Respiratory:  Positive for cough. Negative for sputum production and stridor.        Cough reported by mother however did not see during evaluation  Skin:  Negative for itching and rash.  Neurological:  Positive for speech change and focal weakness.   Past Medical History:  Diagnosis Date   Anemia    Anxiety    DM (diabetes mellitus), type 1 (HCC)    ESRD on hemodialysis (HCC)    Gastroparesis    History of simultaneous kidney and pancreas transplant (HCC) 2014   Hypertension    Ischemic cerebrovascular accident (CVA) (HCC) 11/2021   Narcotic abuse (HCC)    Non compliance with medical treatment    Stroke [facility])    Vision loss    Past Surgical History:  Procedure Laterality Date   A/V FISTULAGRAM Right 02/17/2021   Procedure: A/V FISTULAGRAM;  Surgeon: Gretta Lonni PARAS, MD;  Location: [facility] INVASIVE CV LAB;  Service: Cardiovascular;  Laterality: Right;   AV FISTULA PLACEMENT  11/17/2011   Procedure: ARTERIOVENOUS (AV) FISTULA CREATION;  Surgeon: Krystal JULIANNA Doing, MD;  Location: [facility] OR;  Service: Vascular;  Laterality: Right;   BIOPSY  12/03/2021    Procedure: BIOPSY;  Surgeon: Legrand Victory LITTIE DOUGLAS, MD;  Location: [facility] ENDOSCOPY;  Service: Gastroenterology;;   ESOPHAGOGASTRODUODENOSCOPY (EGD) WITH PROPOFOL  N/A 12/03/2021   Procedure: ESOPHAGOGASTRODUODENOSCOPY (EGD) WITH PROPOFOL ;  Surgeon: Legrand Victory LITTIE DOUGLAS, MD;  Location: [facility] ENDOSCOPY;  Service: Gastroenterology;  Laterality: N/A;   EYE SURGERY     HEMATOMA EVACUATION Right 02/25/2021   Procedure: EVACUATION HEMATOMA RIGHT CHEST;  Surgeon: Sheree Penne Lonni, MD;  Location: [facility] OR;  Service: Vascular;  Laterality: Right;   INSERTION OF DIALYSIS CATHETER Right 12/08/2020   Procedure: INSERTION OF TUNNELED DIALYSIS CATHETER;  Surgeon: Gretta Lonni PARAS, MD;  Location: [facility] OR;  Service: Vascular;  Laterality: Right;   IR ANGIO INTRA EXTRACRAN SEL INTERNAL CAROTID BILAT MOD SED  02/27/2024   IR ANGIO VERTEBRAL SEL VERTEBRAL UNI R MOD SED  02/27/2024   IR GASTROSTOMY TUBE MOD SED  03/20/2024   IR GJ TUBE CHANGE  04/15/2024   IR GJ TUBE CHANGE  05/21/2024   IR REMOVAL TUN CV CATH W/O FL  05/03/2021   KIDNEY TRANSPLANT     LAPAROSCOPIC REVISION VENTRICULAR-PERITONEAL (V-P) SHUNT N/A 06/18/2024   Procedure: REVISION, PROCEDURE INVOLVING VENTRICULOPERITONEAL SHUNT, LAPAROSCOPIC;  Surgeon: Vernetta Berg, MD;  Location: [facility] OR;  Service: General;  Laterality: N/A;   NEPHRECTOMY TRANSPLANTED ORGAN     pancrease transplant     PERIPHERAL VASCULAR BALLOON ANGIOPLASTY Right 02/17/2021   Procedure: PERIPHERAL VASCULAR BALLOON ANGIOPLASTY;  Surgeon: Gretta Lonni PARAS, MD;  Location: [facility] INVASIVE CV LAB;  Service: Cardiovascular;  Laterality: Right;   REFRACTIVE SURGERY     REVISON OF ARTERIOVENOUS FISTULA Right 12/08/2020   Procedure: RIGHT ARM ARTERIOVENOUS FISTULA REVISION AND RESECTION;  Surgeon: Gretta Lonni PARAS, MD;  Location: [facility]  OR;  Service: Vascular;  Laterality: Right;   REVISON OF ARTERIOVENOUS FISTULA Right 02/25/2021   Procedure: RIGHT ARM ARTERIOVENOUS FISTULA REVISION WITH TRANSPOSITION  OF CEPHALIC VEIN ON AXILLARY VEIN;  Surgeon: Gretta Lonni PARAS, MD;  Location: [facility] OR;  Service: Vascular;  Laterality: Right;   VENTRICULOPERITONEAL SHUNT N/A 06/18/2024   Procedure: SHUNT INSERTION VENTRICULAR-PERITONEAL;  Surgeon: Mavis Purchase, MD;  Location: [facility] OR;  Service: Neurosurgery;  Laterality: N/A;  VP SHUNT   Family History  Problem Relation Age of Onset   Hypertension Father    Social History:  reports that she has quit smoking. Her smoking use included pipe. She has never used smokeless tobacco. She reports that she does not drink alcohol  and does not use drugs. Allergies: No Known Allergies Medications Prior to Admission  Medication Sig Dispense Refill   albuterol  (PROVENTIL ) (2.5 MG/3ML) 0.083% nebulizer solution Take 3 mLs (2.5 mg total) by nebulization every 4 (four) hours as needed for wheezing or shortness of breath.     artificial tears ophthalmic solution Place 1 drop into the left eye as needed for dry eyes.     aspirin  EC 81 MG tablet Chew 81 mg by mouth daily.     busPIRone  (BUSPAR ) 10 MG tablet Take 10 mg by mouth 2 (two) times daily.     cinacalcet  (SENSIPAR ) 30 MG tablet Take 3 tablets (90 mg total) by mouth daily with supper. 60 tablet 1   cloNIDine  (CATAPRES  - DOSED IN MG/24 HR) 0.3 mg/24hr patch Place 1 patch (0.3 mg total) onto the skin once a week.     cycloSPORINE  (SANDIMMUNE ) 25 MG capsule Take 25 mg by mouth in the morning.     cycloSPORINE  modified (NEORAL ) 100 MG capsule Take 2 capsules (200 mg total) by mouth 2 (two) times daily.     Darbepoetin Alfa  (ARANESP ) 60 MCG/0.3ML SOSY injection Inject 0.3 mLs (60 mcg total) into the vein every Friday with hemodialysis. 4.2 mL    doxercalciferol  (HECTOROL ) 4 MCG/2ML injection Inject 3 mLs (6 mcg total) into the vein every Monday, Wednesday, and Friday with hemodialysis. 1 mL    ferric citrate  (AURYXIA ) 1 GM 210 MG(Fe) tablet Take 210-630 mg by mouth See admin instructions. Take 3 tablets (630 mg) by mouth  three times daily with meals, and take 1 tablet (210 mg) twice daily with snacks     gabapentin  (NEURONTIN ) 100 MG capsule Take 100 mg by mouth 2 (two) times daily.     hydrOXYzine  (VISTARIL ) 25 MG capsule Take 25 mg by mouth 3 (three) times daily as needed for anxiety.     lacosamide  100 MG TABS Take 1 tablet (100 mg total) by mouth 2 (two) times daily.     lidocaine -prilocaine  (EMLA ) cream Apply 1 application  topically daily as needed (prior to port access).     MELATONIN PO Take 1 tablet by mouth at bedtime.     metoCLOPramide  (REGLAN ) 5 MG tablet Take 5 mg by mouth 2 (two) times daily before a meal.     metoprolol  tartrate (LOPRESSOR ) 50 MG tablet Take 1 tablet (50 mg total) by mouth 2 (two) times daily.     multivitamin (RENA-VIT) TABS tablet Take 1 tablet by mouth at bedtime.     mycophenolate  (CELLCEPT ) 200 mg/mL oral suspension Take 2.5 mLs (500 mg total) by mouth 2 (two) times daily.     Nutritional Supplements (FEEDING SUPPLEMENT, NEPRO CARB STEADY,) LIQD Place 1,000 mLs into feeding tube continuous.     Nystatin (GERHARDT'S  BUTT CREAM) CREA Apply 1 Application topically as needed for irritation.     ondansetron  (ZOFRAN ) 4 MG tablet Take 4 mg by mouth every 8 (eight) hours as needed for nausea or vomiting.     oxyCODONE -acetaminophen  (PERCOCET/ROXICET) 5-325 MG tablet Take 1 tablet by mouth 3 (three) times daily.     predniSONE  (DELTASONE ) 5 MG tablet Take 15 mg by mouth daily.  5   sevelamer  carbonate (RENVELA ) 2.4 g PACK Take 2.4 g by mouth 3 (three) times daily with meals.     simethicone  (MYLICON) 40 MG/0.6ML drops Take 0.6 mLs (40 mg total) by mouth 4 (four) times daily as needed for flatulence.     XPHOZAH 30 MG TABS Take 1 tablet by mouth 2 (two) times daily.     zolpidem  (AMBIEN ) 10 MG tablet Take 5 mg by mouth at bedtime as needed for sleep.     amLODipine  (NORVASC ) 10 MG tablet Take 1 tablet (10 mg total) by mouth daily. 30 tablet 2   DULoxetine  (CYMBALTA ) 20 MG capsule Take  1 capsule (20 mg total) by mouth daily. 30 capsule 2   hydrALAZINE  (APRESOLINE ) 25 MG tablet Take 3 tablets (75 mg total) by mouth every 8 (eight) hours. 270 tablet 2     Blood pressure 130/63, pulse 88, temperature 98 F (36.7 C), temperature source Oral, resp. rate 19, height 5' 3 (1.6 m), weight 52.8 kg, SpO2 100%. Physical Exam Vitals and nursing note reviewed.  Constitutional:      Appearance: She is ill-appearing.  HENT:     Head: Hair is abnormal.     Comments: Bandage over ventriculostomy site Eyes:     Extraocular Movements:     Right eye: No nystagmus.     Left eye: No nystagmus.  Cardiovascular:     Rate and Rhythm: Normal rate and regular rhythm.     Heart sounds: No murmur heard. Pulmonary:     Effort: No respiratory distress.     Breath sounds: Normal breath sounds. No stridor. No wheezing, rhonchi or rales.  Musculoskeletal:     Cervical back: No edema or torticollis.     Right lower leg: No edema.     Left lower leg: No edema.     Comments: No pain with range of motion in the upper and lower extremities.  There is muscle atrophy bilateral upper and lower extremities  Neurological:     Mental Status: She is alert.     Cranial Nerves: No dysarthria.     Sensory: Sensation is intact.     Motor: Weakness present. No tremor or abnormal muscle tone.     Coordination: Coordination abnormal. Finger-Nose-Finger Test abnormal.     Gait: Gait abnormal.     Comments: Oriented to person and place but not time Neurologic exam limited by reduced attention concentration as well as intermittent lethargy Motor strength is 4 - in the right deltoid bicep tricep finger flexors extensors right hip flexor knee extensor ankle dorsiflexor 3 - in the left deltoid bicep tricep grip hip flexor knee extensor ankle dorsiflexor plantar flexor     Results for orders placed or performed during the hospital encounter of 05/19/24 (from the past 24 hours)  Glucose, capillary     Status:  Abnormal   Collection Time: 06/22/24  4:36 PM  Result Value Ref Range   Glucose-Capillary 192 (H) 70 - 99 mg/dL   Comment 1 Notify RN    Comment 2 Document in Chart   Glucose, capillary  Status: Abnormal   Collection Time: 06/22/24  8:33 PM  Result Value Ref Range   Glucose-Capillary 145 (H) 70 - 99 mg/dL   Comment 1 Notify RN   Glucose, capillary     Status: Abnormal   Collection Time: 06/22/24 11:37 PM  Result Value Ref Range   Glucose-Capillary 152 (H) 70 - 99 mg/dL   Comment 1 Notify RN   CBC     Status: Abnormal   Collection Time: 06/23/24  6:00 AM  Result Value Ref Range   WBC 4.1 4.0 - 10.5 K/uL   RBC 2.86 (L) 3.87 - 5.11 MIL/uL   Hemoglobin 8.9 (L) 12.0 - 15.0 g/dL   HCT 72.0 (L) 63.9 - 53.9 %   MCV 97.6 80.0 - 100.0 fL   MCH 31.1 26.0 - 34.0 pg   MCHC 31.9 30.0 - 36.0 g/dL   RDW 85.6 88.4 - 84.4 %   Platelets 124 (L) 150 - 400 K/uL   nRBC 0.0 0.0 - 0.2 %  Basic metabolic panel     Status: Abnormal   Collection Time: 06/23/24  6:00 AM  Result Value Ref Range   Sodium 132 (L) 135 - 145 mmol/L   Potassium 4.0 3.5 - 5.1 mmol/L   Chloride 88 (L) 98 - 111 mmol/L   CO2 26 22 - 32 mmol/L   Glucose, Bld 146 (H) 70 - 99 mg/dL   BUN 97 (H) 6 - 20 mg/dL   Creatinine, Ser 4.63 (H) 0.44 - 1.00 mg/dL   Calcium  10.9 (H) 8.9 - 10.3 mg/dL   GFR, Estimated 10 (L) >60 mL/min   Anion gap 18 (H) 5 - 15  Phosphorus     Status: None   Collection Time: 06/23/24  6:00 AM  Result Value Ref Range   Phosphorus 4.6 2.5 - 4.6 mg/dL  Glucose, capillary     Status: Abnormal   Collection Time: 06/23/24  7:37 AM  Result Value Ref Range   Glucose-Capillary 159 (H) 70 - 99 mg/dL   Comment 1 Notify RN    DG Chest Port 1 View Result Date: 06/22/2024 CLINICAL DATA:  Cough. EXAM: PORTABLE CHEST 1 VIEW COMPARISON:  06/04/2024 FINDINGS: Ventriculostomy catheter over the right neck, chest and abdomen. Three skin staples over the right upper chest. Several surgical clips over the lateral right  upper chest. Lungs are adequately inflated without airspace consolidation or effusion. Subtle hazy perihilar prominence which may be due to subtle vascular congestion or acute bronchitic process. Cardiomediastinal silhouette and remainder of the exam is unchanged. IMPRESSION: Subtle hazy perihilar prominence which may be due to subtle vascular congestion or acute bronchitic process. Electronically Signed   By: Toribio Agreste M.D.   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 Does the need for close, 24 hr/day medical supervision in concert with the patient's rehab needs make it unreasonable for this patient to be served in a less intensive setting? Potentially Co-Morbidities requiring supervision/potential complications:  - End-stage renal disease on hemodialysis, diabetes type 1, gastroparesis, severe dysphagia n.p.o., history of recurrent pneumonia, severe deconditioning Due to bowel management, safety, skin/wound care, disease management, medication administration, pain management, and patient education, does the patient require 24 hr/day rehab nursing? Potentially Does the patient require coordinated care of a physician, rehab nurse, therapy disciplines of PT, OT, speech to address physical and functional deficits in the context of the above medical diagnosis(es)? Potentially Addressing deficits  in the following areas: balance, endurance, locomotion, strength, transferring, bowel/bladder control, bathing, dressing, feeding, grooming, toileting, cognition, speech, language, swallowing, and psychosocial support Can the patient actively participate in an intensive therapy program of at least 3 hrs of therapy per day at least 5 days per week? No The potential for patient to make measurable gains while on inpatient rehab is currently poor Anticipated functional outcomes upon discharge from inpatient  rehab are n/a  with PT, n/a with OT, n/a with SLP. Estimated rehab length of stay to reach the above functional goals is: Not applicable Anticipated discharge destination: LTAC Overall Rehab/Functional Prognosis: fair  POST ACUTE RECOMMENDATIONS: This patient's condition is appropriate for continued rehabilitative care in the following setting: LTACH Patient has agreed to participate in recommended program. Patient not capable of making decisions, mother would be okay with this Note that insurance prior authorization may be required for reimbursement for recommended care.  Comment: Spoke to patient's mother regarding my recommendations MEDICAL RECOMMENDATIONS: If patient has improved endurance for therapy after LTAC, CIR stay would be appropriate    I have personally performed a face to face diagnostic evaluation of this patient. Additionally, I have examined the patient's medical record including any pertinent labs and radiographic images.    Thanks,  Prentice FORBES Compton, MD 06/23/2024

## 2024-06-23 NOTE — Progress Notes (Signed)
 SLP Cancellation Note  Patient Details Name: [name] [name] MRN: [id number] DOB: April 04, 1981   Cancelled treatment:       Reason Eval/Treat Not Completed: Patient at procedure or test/unavailable   Fruma Africa, Consuelo Fitch 06/23/2024, 9:52 AM

## 2024-06-23 NOTE — Progress Notes (Signed)
 Subjective: The patient is alert and attentive.  She is in no apparent distress.  Objective: Vital signs in last 24 hours: Temp:  [98 F (36.7 C)-98.9 F (37.2 C)] 98 F (36.7 C) (10/20 0735) Pulse Rate:  [79-95] 92 (10/20 1221) Resp:  [14-27] 23 (10/20 1221) BP: (76-172)/(50-75) 91/55 (10/20 1221) SpO2:  [95 %-100 %] 100 % (10/20 1221) Weight:  [52.8 kg-55.9 kg] 52.8 kg (10/20 0832) Estimated body mass index is 20.62 kg/m as calculated from the following:   Height as of this encounter: 5' 3 (1.6 m).   Weight as of this encounter: 52.8 kg.   Intake/Output from previous day: No intake/output data recorded. Intake/Output this shift: No intake/output data recorded.  Physical exam the patient is alert and attentive.  She speaks some.  She is moving her left side more than the right.  Her scalp incision dressing is clean and dry.  Lab Results: Recent Labs    06/22/24 0644 06/23/24 0600  WBC 5.1 4.1  HGB 9.1* 8.9*  HCT 28.1* 27.9*  PLT 137* 124*   BMET Recent Labs    06/22/24 0644 06/23/24 0600  NA 135 132*  K 3.4* 4.0  CL 88* 88*  CO2 29 26  GLUCOSE 141* 146*  BUN 66* 97*  CREATININE 4.10* 5.36*  CALCIUM  11.1* 10.9*    Studies/Results: DG Chest Port 1 View Result Date: 06/22/2024 CLINICAL DATA:  Cough. EXAM: PORTABLE CHEST 1 VIEW COMPARISON:  06/04/2024 FINDINGS: Ventriculostomy catheter over the right neck, chest and abdomen. Three skin staples over the right upper chest. Several surgical clips over the lateral right upper chest. Lungs are adequately inflated without airspace consolidation or effusion. Subtle hazy perihilar prominence which may be due to subtle vascular congestion or acute bronchitic process. Cardiomediastinal silhouette and remainder of the exam is unchanged. IMPRESSION: Subtle hazy perihilar prominence which may be due to subtle vascular congestion or acute bronchitic process. Electronically Signed   By: Toribio Agreste M.D.   On: 06/22/2024  10:24    Assessment/Plan: Postop day #5 status postplacement of ventriculoperitoneal shunt: The patient is doing well.  LOS: 35 days     [name] 06/23/2024, 12:37 PM     Patient ID: [name], female   DOB: [birth year], 43 y.o.   MRN: [id number]

## 2024-06-23 NOTE — Procedures (Signed)
 HD Note:  Some information was entered later than the data was gathered due to patient care needs. The stated time with the data is accurate.  Received patient in bed to unit.   Alert and oriented.   Informed consent signed and in chart.   Access used: Right upper fistula Access issues: None  Patient pulled needles out approximately 15 min into treatment.  Patient did not appear aware of what was happening.  Patient's left arm wandering.  Appears that the left hand found the needles and pulled them out.  Patient cleaned up, re cannulated, and treatment resumed.  Left hand minimally restrained.  The range set with removing the ability of the left hand getting to the right arm.  Blood was unable to be returned.  Air got into the cartridge.  Patient estimated blood loss between 300-400 ml.  Dr. Tobie came to beside during rounds and was informed of the blood loss.  Patient SBP dropped below the 100 limit set in dialysis orders.  Patient responded yes to feeling dizzy.  100 ml given back resulting in an increase of BP.  UF remained paused until the SBP was > 100  Patient had two small BM, liquid.  TX duration: 3.25 hours  Alert, without acute distress.  Total UF removed: 1500 ml  Hand-off given to patient's nurse.   Transported back to the room   Ainhoa Rallo L. Lenon, RN Kidney Dialysis Unit.

## 2024-06-24 ENCOUNTER — Ambulatory Visit: Admitting: Gastroenterology

## 2024-06-24 DIAGNOSIS — I619 Nontraumatic intracerebral hemorrhage, unspecified: Secondary | ICD-10-CM | POA: Diagnosis not present

## 2024-06-24 LAB — CBC WITH DIFFERENTIAL/PLATELET
Abs Immature Granulocytes: 0.02 K/uL (ref 0.00–0.07)
Basophils Absolute: 0 K/uL (ref 0.0–0.1)
Basophils Relative: 0 %
Eosinophils Absolute: 0.1 K/uL (ref 0.0–0.5)
Eosinophils Relative: 2 %
HCT: 28.4 % — ABNORMAL LOW (ref 36.0–46.0)
Hemoglobin: 9.5 g/dL — ABNORMAL LOW (ref 12.0–15.0)
Immature Granulocytes: 0 %
Lymphocytes Relative: 18 %
Lymphs Abs: 0.9 K/uL (ref 0.7–4.0)
MCH: 31.5 pg (ref 26.0–34.0)
MCHC: 33.5 g/dL (ref 30.0–36.0)
MCV: 94 fL (ref 80.0–100.0)
Monocytes Absolute: 0.5 K/uL (ref 0.1–1.0)
Monocytes Relative: 10 %
Neutro Abs: 3.4 K/uL (ref 1.7–7.7)
Neutrophils Relative %: 70 %
Platelets: 135 K/uL — ABNORMAL LOW (ref 150–400)
RBC: 3.02 MIL/uL — ABNORMAL LOW (ref 3.87–5.11)
RDW: 14.2 % (ref 11.5–15.5)
WBC: 4.9 K/uL (ref 4.0–10.5)
nRBC: 0 % (ref 0.0–0.2)

## 2024-06-24 LAB — GLUCOSE, CAPILLARY
Glucose-Capillary: 117 mg/dL — ABNORMAL HIGH (ref 70–99)
Glucose-Capillary: 129 mg/dL — ABNORMAL HIGH (ref 70–99)
Glucose-Capillary: 130 mg/dL — ABNORMAL HIGH (ref 70–99)
Glucose-Capillary: 131 mg/dL — ABNORMAL HIGH (ref 70–99)
Glucose-Capillary: 138 mg/dL — ABNORMAL HIGH (ref 70–99)
Glucose-Capillary: 139 mg/dL — ABNORMAL HIGH (ref 70–99)
Glucose-Capillary: 171 mg/dL — ABNORMAL HIGH (ref 70–99)

## 2024-06-24 MED ORDER — CARVEDILOL 12.5 MG PO TABS: 25.0000 mg | ORAL_TABLET | ORAL | Status: DC

## 2024-06-24 MED ORDER — CLONIDINE HCL 0.1 MG PO TABS
0.3000 mg | ORAL_TABLET | ORAL | Status: DC
  Administered 2024-06-24 – 2024-07-15 (×13): 0.3 mg
  Filled 2024-06-24 (×16): qty 3

## 2024-06-24 MED ORDER — CARVEDILOL 12.5 MG PO TABS
25.0000 mg | ORAL_TABLET | ORAL | Status: DC
Start: 2024-06-24 — End: 2024-07-10
  Administered 2024-06-24 – 2024-07-10 (×17): 25 mg
  Filled 2024-06-24 (×21): qty 2

## 2024-06-24 MED ORDER — LOPERAMIDE HCL 2 MG PO CAPS
4.0000 mg | ORAL_CAPSULE | ORAL | Status: DC | PRN
  Administered 2024-06-29: 4 mg via ORAL
  Filled 2024-06-24 (×3): qty 2

## 2024-06-24 NOTE — Progress Notes (Signed)
 Subjective: The patient is alert and in no apparent distress.  Her mother is at the bedside.  She is having some diarrhea.  Objective: Vital signs in last 24 hours: Temp:  [97.7 F (36.5 C)-98.2 F (36.8 C)] 97.8 F (36.6 C) (10/21 0441) Pulse Rate:  [79-95] 82 (10/21 0441) Resp:  [17-27] 20 (10/21 0000) BP: (76-172)/(50-107) 158/107 (10/21 0441) SpO2:  [95 %-100 %] 100 % (10/21 0441) Weight:  [52.8 kg] 52.8 kg (10/21 0443) Estimated body mass index is 20.62 kg/m as calculated from the following:   Height as of this encounter: 5' 3 (1.6 m).   Weight as of this encounter: 52.8 kg.   Intake/Output from previous day: No intake/output data recorded. Intake/Output this shift: No intake/output data recorded.  Physical exam the patient is alert.  She says she feels fine.  Her shunt dressing is clean and dry.  Lab Results: Recent Labs    06/22/24 0644 06/23/24 0600  WBC 5.1 4.1  HGB 9.1* 8.9*  HCT 28.1* 27.9*  PLT 137* 124*   BMET Recent Labs    06/22/24 0644 06/23/24 0600  NA 135 132*  K 3.4* 4.0  CL 88* 88*  CO2 29 26  GLUCOSE 141* 146*  BUN 66* 97*  CREATININE 4.10* 5.36*  CALCIUM  11.1* 10.9*    Studies/Results: DG Chest Port 1 View Result Date: 06/22/2024 CLINICAL DATA:  Cough. EXAM: PORTABLE CHEST 1 VIEW COMPARISON:  06/04/2024 FINDINGS: Ventriculostomy catheter over the right neck, chest and abdomen. Three skin staples over the right upper chest. Several surgical clips over the lateral right upper chest. Lungs are adequately inflated without airspace consolidation or effusion. Subtle hazy perihilar prominence which may be due to subtle vascular congestion or acute bronchitic process. Cardiomediastinal silhouette and remainder of the exam is unchanged. IMPRESSION: Subtle hazy perihilar prominence which may be due to subtle vascular congestion or acute bronchitic process. Electronically Signed   By: Toribio Agreste M.D.   On: 06/22/2024 10:24     Assessment/Plan: Postop day #6 status postplacement of ventriculoperitoneal shunt: The patient is doing well.  It looks like she will need rehab.  LOS: 36 days     [name] [name] 06/24/2024, 7:39 AM     Patient ID: [name] [name], female   DOB: [birth year], 43 y.o.   MRN: [id number]

## 2024-06-24 NOTE — Progress Notes (Addendum)
 Physical Therapy Treatment Patient Details Name: [name] [name] MRN: [id number] DOB: Nov 05, 1980 Today's Date: 06/24/2024   History of Present Illness 43 yo female presents 05/19/24 for left gaze and L side weakness. 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 Intubated 9/15-9/23. EVD 9/15- 10/15; s/p shunt 10/15. S/p laparoscopy with intra-abdominal assistance ventricular-peritoneal shunt placement 10/15. 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 Pt is asleep upon entry, but arouses easily with verbal stimulation. Pt highly distractable and restless, itching all over and reports abdominal pain (RN notified). Pt requiring min-mod +2 assist for bed mobility and transfers to standing. Worked on seated functional reaching to address balance and visual tracking. Recommend post acute rehab to address deficits and maximize functional mobility.     If plan is discharge home, recommend the following: A lot of help with walking and/or transfers;A lot of help with bathing/dressing/bathroom;Assistance with cooking/housework;Direct supervision/assist for medications management;Direct supervision/assist for financial management;Assist for transportation;Help with stairs or ramp for entrance   Can travel by private vehicle        Equipment Recommendations  Hospital bed;Hoyer lift;Wheelchair (measurements PT);Wheelchair cushion (measurements PT);BSC/3in1    Recommendations for Other Services       Precautions / Restrictions Precautions Precautions: Fall Recall of Precautions/Restrictions: Impaired Precaution/Restrictions Comments: LUQ PEG Restrictions Weight  Bearing Restrictions Per Provider Order: No     Mobility  Bed Mobility Overal bed mobility: Needs Assistance Bed Mobility: Rolling, Sidelying to Sit, Sit to Supine Rolling: Min assist Sidelying to sit: Mod assist, +2 for physical assistance   Sit to supine: Min assist, +2 for safety/equipment   General bed mobility comments: Pt initiating rolling to L, reaching over for rail, assist for BLE's off edge of bed and trunk to sit up. Pt bringing BLE's back into bed, just needed guidance for trunk to lie back down    Transfers Overall transfer level: Needs assistance Equipment used: 2 person hand held assist Transfers: Sit to/from Stand Sit to Stand: Mod assist, +2 physical assistance           General transfer comment: ModA + 2 with bilateral knee block to power up to stand    Ambulation/Gait                   Stairs             Wheelchair Mobility     Tilt Bed    Modified Rankin (Stroke Patients Only)       Balance Overall balance assessment: Needs assistance Sitting-balance support: Feet supported Sitting balance-Leahy Scale: Poor Sitting balance - Comments: CGA-minA   Standing balance support: Bilateral upper extremity supported Standing balance-Leahy Scale: Poor                              Communication Communication Communication: No apparent difficulties  Cognition Arousal: Alert Behavior During Therapy: Restless   PT - Cognitive impairments: Awareness, Memory, Attention, Initiation, Sequencing, Safety/Judgement, Problem solving                       PT - Cognition Comments: Very restless and distractable, frequently itching all over  and attempting to pull at [facility], needs redirection. Able to verbalize, I'm in pain. Following commands: Impaired Following commands impaired: Follows one step commands inconsistently    Cueing Cueing Techniques: Verbal cues, Gestural cues, Tactile cues  Exercises General Exercises -  Lower Extremity Long Arc Quad: AAROM, Both, 5 reps, Seated Other Exercises Other Exercises: Sitting: functional reaching with object from home with RUE and visual tracking (looks to R, not to L)    General Comments        Pertinent Vitals/Pain Pain Assessment Pain Assessment: Faces Faces Pain Scale: Hurts even more Pain Location: stomach Pain Descriptors / Indicators: Grimacing, Guarding, Other (Comment) (also itching all over) Pain Intervention(s): Monitored during session, Limited activity within patient's tolerance, Other (comment) (RN notified)    Home Living                          Prior Function            PT Goals (current goals can now be found in the care plan section) Acute Rehab PT Goals Patient Stated Goal: unable to state goal Potential to Achieve Goals: Fair Progress towards PT goals: Progressing toward goals    Frequency    Min 2X/week      PT Plan      Co-evaluation              AM-PAC PT 6 Clicks Mobility   Outcome Measure  Help needed turning from your back to your side while in a flat bed without using bedrails?: A Little Help needed moving from lying on your back to sitting on the side of a flat bed without using bedrails?: A Lot Help needed moving to and from a bed to a chair (including a wheelchair)?: A Lot Help needed standing up from a chair using your arms (e.g., wheelchair or bedside chair)?: Total Help needed to walk in hospital room?: Total Help needed climbing 3-5 steps with a railing? : Total 6 Click Score: 10    End of Session Equipment Utilized During Treatment: Gait belt Activity Tolerance: Patient tolerated treatment well Patient left: in bed;with call bell/phone within reach;with bed alarm set Nurse Communication: Mobility status PT Visit Diagnosis: Unsteadiness on feet (R26.81);Other abnormalities of gait and mobility (R26.89);Muscle weakness (generalized) (M62.81);Difficulty in walking, not elsewhere  classified (R26.2);Other symptoms and signs involving the nervous system (R29.898)     Time: 8655-8592 PT Time Calculation (min) (ACUTE ONLY): 23 min  Charges:    $Therapeutic Activity: 23-37 mins PT General Charges $$ ACUTE PT VISIT: 1 Visit                     Aleck Daring, PT, DPT Acute Rehabilitation Services Office [phone]    [name] 06/24/2024, 3:00 PM

## 2024-06-24 NOTE — TOC Progression Note (Signed)
 Transition of Care [facility]) - Progression Note    Patient Details  Name: [name] [name] MRN: [id number] Date of Birth: 01-Mar-1981  Transition of [facility]) CM/SW Contact  [name], RN Phone Number: 06/24/2024, 11:23 AM  Clinical Narrative:     CIR has recommended LTACH. CM inquired with the MD and he is also in agreement for LTACH. CM met with the patient and her mother and pts mother agrees to try for [facility] as pt has been with [facility] before. CM has placed the referral to [facility]. [facility] is going to submit to pts insurance to see if approved.  IP care management following.   Expected Discharge Plan: Long Term Acute Care (LTAC) Barriers to Discharge: Continued Medical Work up, Air traffic controller and Services In-house Referral: Clinical Social Work   Post Acute Care Choice: Skilled Nursing Facility Living arrangements for the past 2 months: Apartment                                       Social Drivers of Health (SDOH) Interventions SDOH Screenings   Food Insecurity: Patient Unable To Answer (05/20/2024)  Housing: Patient Unable To Answer (05/20/2024)  Transportation Needs: Patient Unable To Answer (05/20/2024)  Utilities: Patient Unable To Answer (05/20/2024)  Social Connections: Unknown (10/04/2022)   Received from [facility]  Stress: No Stress Concern Present (04/16/2024)   Received from [facility]  Tobacco Use: Medium Risk (06/18/2024)    Readmission Risk Interventions    05/20/2024    4:16 PM 03/31/2024   11:27 AM 11/17/2021   12:15 PM  Readmission Risk Prevention Plan  Transportation Screening Complete Complete Complete  Medication Review Oceanographer) Complete Complete Complete  PCP or Specialist appointment within 3-5 days of discharge Complete  Complete  HRI or Home Care Consult Complete Complete Complete  SW Recovery Care/Counseling Consult Complete Complete Complete  Palliative  Care Screening Complete Not Applicable Not Applicable  Skilled Nursing Facility Complete Not Applicable Patient Refused

## 2024-06-24 NOTE — Plan of Care (Signed)
  Problem: Education: Goal: Ability to describe self-care measures that may prevent or decrease complications (Diabetes Survival Skills Education) will improve Outcome: Progressing Goal: Individualized Educational Video(s) Outcome: Progressing   Problem: Coping: Goal: Ability to adjust to condition or change in health will improve Outcome: Progressing   Problem: Fluid Volume: Goal: Ability to maintain a balanced intake and output will improve Outcome: Progressing   Problem: Health Behavior/Discharge Planning: Goal: Ability to identify and utilize available resources and services will improve Outcome: Progressing Goal: Ability to manage health-related needs will improve Outcome: Progressing   Problem: Metabolic: Goal: Ability to maintain appropriate glucose levels will improve Outcome: Progressing   Problem: Nutritional: Goal: Maintenance of adequate nutrition will improve Outcome: Progressing Goal: Progress toward achieving an optimal weight will improve Outcome: Progressing   Problem: Skin Integrity: Goal: Risk for impaired skin integrity will decrease Outcome: Progressing   Problem: Tissue Perfusion: Goal: Adequacy of tissue perfusion will improve Outcome: Progressing   Problem: Education: Goal: Knowledge of disease or condition will improve Outcome: Progressing Goal: Knowledge of secondary prevention will improve (MUST DOCUMENT ALL) Outcome: Progressing Goal: Knowledge of patient specific risk factors will improve (DELETE if not current risk factor) Outcome: Progressing   Problem: Intracerebral Hemorrhage Tissue Perfusion: Goal: Complications of Intracerebral Hemorrhage will be minimized Outcome: Progressing   Problem: Coping: Goal: Will verbalize positive feelings about self Outcome: Progressing Goal: Will identify appropriate support needs Outcome: Progressing   Problem: Health Behavior/Discharge Planning: Goal: Ability to manage health-related needs will  improve Outcome: Progressing Goal: Goals will be collaboratively established with patient/family Outcome: Progressing   Problem: Self-Care: Goal: Ability to participate in self-care as condition permits will improve Outcome: Progressing Goal: Verbalization of feelings and concerns over difficulty with self-care will improve Outcome: Progressing Goal: Ability to communicate needs accurately will improve Outcome: Progressing   Problem: Nutrition: Goal: Risk of aspiration will decrease Outcome: Progressing Goal: Dietary intake will improve Outcome: Progressing   Problem: Education: Goal: Knowledge of General Education information will improve Description: Including pain rating scale, medication(s)/side effects and non-pharmacologic comfort measures Outcome: Progressing   Problem: Health Behavior/Discharge Planning: Goal: Ability to manage health-related needs will improve Outcome: Progressing   Problem: Clinical Measurements: Goal: Ability to maintain clinical measurements within normal limits will improve Outcome: Progressing Goal: Will remain free from infection Outcome: Progressing Goal: Diagnostic test results will improve Outcome: Progressing Goal: Respiratory complications will improve Outcome: Progressing Goal: Cardiovascular complication will be avoided Outcome: Progressing   Problem: Activity: Goal: Risk for activity intolerance will decrease Outcome: Progressing   Problem: Nutrition: Goal: Adequate nutrition will be maintained Outcome: Progressing   Problem: Coping: Goal: Level of anxiety will decrease Outcome: Progressing   Problem: Elimination: Goal: Will not experience complications related to bowel motility Outcome: Progressing   Problem: Pain Managment: Goal: General experience of comfort will improve and/or be controlled Outcome: Progressing   Problem: Safety: Goal: Ability to remain free from injury will improve Outcome: Progressing    Problem: Skin Integrity: Goal: Risk for impaired skin integrity will decrease Outcome: Progressing   Problem: Education: Goal: Knowledge of the prescribed therapeutic regimen will improve Outcome: Progressing   Problem: Clinical Measurements: Goal: Usual level of consciousness will be regained or maintained. Outcome: Progressing Goal: Neurologic status will improve Outcome: Progressing Goal: Ability to maintain intracranial pressure will improve Outcome: Progressing   Problem: Skin Integrity: Goal: Demonstration of wound healing without infection will improve Outcome: Progressing

## 2024-06-24 NOTE — Progress Notes (Signed)
 5 soft mushy stools since 1700.

## 2024-06-24 NOTE — Care Management Important Message (Signed)
 Important Message  Patient Details  Name: [name] [name] MRN: [id number] Date of Birth: May 08, 1981   Important Message Given:  Yes - Medicare IM     Claretta Deed 06/24/2024, 3:14 PM

## 2024-06-24 NOTE — Progress Notes (Signed)
 Patient ID: [name] [name], female   DOB: 1981-03-23, 43 y.o.   MRN: [id number] [facility] Progress Note   Assessment/ Plan:   1.  Acute right intracranial hemorrhage: Associated with uncontrolled hypertension and status post conversion from EVD to VP shunt on 06/18/2024 by neurosurgery.  Mental status appears to be improving but she continues to require total assistance for PT/OT.  Recommendations made for LTAC placement. 2. ESRD following failure of renal allograft: Continue hemodialysis on Monday/Wednesday/Friday schedule via right BCF with next HD tomorrow.  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  She is currently euvolemic on physical exam. 3. Anemia: Hemoglobin/hematocrit continue to remain stable without any overt blood loss.  She remains on Aranesp  100 mcg weekly. 4. CKD-MBD: With marginally elevated calcium  that is suspected to be largely from immobility.  Status post pamidronate on 06/15/2024 and will start Cinacalcet  3 days a week. 5. Nutrition: Currently on tube feeds with Nepro along with renal MVI. 6. Hypertension: Intermittently elevated blood pressures noted on the current regimen, will continue to follow with hemodialysis/UF.  Subjective:   Underwent hemodialysis yesterday and accidentally dislodged needles with EBL of around 300 to 400 cc.  Hemoglobin/hematocrit surprisingly stable.   Objective:   BP (!) 190/78 (BP Location: Left Arm)   Pulse 93   Temp 97.7 F (36.5 C) (Oral)   Resp 11   Ht 5' 3 (1.6 m)   Wt 52.8 kg   SpO2 98%   BMI 20.62 kg/m   Physical Exam: Gen: Somnolent, unarousable resting in bed.  Mother at bedside CVS: Pulse regular rhythm, normal rate, S1 and S2 normal Resp: Anteriorly clear to auscultation, no rales/rhonchi Abd: Soft, flat, nontender, bowel sounds normal Ext: No lower extremity edema.  Right  upper arm AV fistula with intact dressings Labs: BMET Recent Labs  Lab 06/18/24 0353 06/18/24 1937 06/19/24 0556 06/20/24 0526 06/21/24 0505 06/22/24 0644 06/23/24 0600  NA 131* 132* 134* 135 133* 135 132*  K 4.9 4.7 4.8 3.7 3.6 3.4* 4.0  CL 87* 90* 92* 90* 90* 88* 88*  CO2 26 23 23 26 26 29 26   GLUCOSE 106* 153* 115* 135* 140* 141* 146*  BUN 57* 67* 79* 44* 30* 66* 97*  CREATININE 3.56* 4.47* 5.00* 3.24* 2.24* 4.10* 5.36*  CALCIUM  11.0* 10.7* 11.3* 11.0* 10.7* 11.1* 10.9*  PHOS 5.1*  --  7.1* 4.8* 3.6 4.5 4.6   CBC Recent Labs  Lab 06/21/24 0505 06/22/24 0644 06/23/24 0600 06/24/24 0827  WBC 5.3 5.1 4.1 4.9  NEUTROABS  --   --   --  3.4  HGB 9.6* 9.1* 8.9* 9.5*  HCT 29.7* 28.1* 27.9* 28.4*  MCV 98.0 97.2 97.6 94.0  PLT 167 137* 124* 135*      Medications:     carvedilol   25 mg Per Tube 2 times per day on Sunday Tuesday Thursday Saturday   Chlorhexidine  Gluconate Cloth  6 each Topical Q0600   cloNIDine   0.3 mg Per Tube Once per day on Sunday Tuesday Thursday Saturday   cycloSPORINE   200 mg Per Tube QPM   cycloSPORINE   225 mg Per Tube Daily   darbepoetin (ARANESP ) injection - DIALYSIS  100 mcg Subcutaneous Q Wed-1800   fiber  1 packet Per Tube TID   insulin  aspart  0-9 Units Subcutaneous Q4H   lacosamide   100 mg Per Tube BID   multivitamin with minerals  1  tablet Per Tube Daily   mycophenolate   500 mg Per Tube BID   mouth rinse  15 mL Mouth Rinse 4 times per day   oxidized cellulose  1 each Topical Once   pantoprazole  (PROTONIX ) IV  40 mg Intravenous QHS   predniSONE   15 mg Per Tube Q breakfast   Gordy Blanch, MD 06/24/2024, 9:27 AM

## 2024-06-24 NOTE — Progress Notes (Signed)
 PROGRESS NOTE    [name] [name]  FMW:[id number] DOB: [birth year] DOA: 05/19/2024 PCP: [facility]   Brief Narrative:  [name] with PMH HTN, ischemic and hemorrhagic CVA, type 1 diabetes, gastroparesis, kidney and pancreas transplant 2014, subsequent failure of renal transplant, now ESRD on dialysis, anxiety, narcotic abuse.  Presented to the [facility] ED from dialysis as a code stroke on 9/15; subsequently found to have hemorrhagic stroke.  Neurology, neurosurgery, PCCM consulted.  EVD was placed on the left side.  Patient admitted to neuro ICU.  She was intubated on admission.  She had prolonged ICU stay with EVD drain which was changed to VP shunt on 10/15.  Extubated on 9/23.  Transferred to [facility] service on 10/17.     She was recently discharged from here in August after she was managed for ischemic/hemorrhagic stroke and was discharged to LTAC.    Important events:  9/15 admitted with ICH plus IVH, EVD was placed for hydrocephalus, intubated 9/22 palliative care meeting.  Family is insistent that they want full scope of care 9/23 Extubated 10/1 -developed fevers pancultures drawn and broad-spectrum antibiotics started 10/2 became afebrile after starting antibiotics, did not tolerate raising EVD 10/6 EVD was raised from 10 to 30 cm water , tolerating well, remained afebrile 10/7 EVD is at 30 cm water , CT scan will be done tomorrow morning, no overnight issues 10/8 she became somnolent, CT head showed increasing hydrocephalus, EVD was titrated down to 10 cm of water  with improvement in mental status 10/15 VP shunt placement 10/17 transferred to progressive unit with [name]   Assessment & Plan:   Principal Problem:   Hemorrhagic stroke [facility]) Active Problems:   Moderate protein-calorie malnutrition   Pressure injury of skin   Nontraumatic subcortical hemorrhage of right cerebral hemisphere [facility])  Acute right parietotemporal intraparenchymal  hemorrhage/intraventricular hemorrhage Uncontrolled hypertension Obstructive hydrocephalus s/p EVD changed to VP shunt Seizure disorder s/p VP shunt placement 06/18/2024 by Dr. Mavis.  Remains stable on Vimpat  100 mg twice daily.  Neurosurgery following.  Per them, no issue after the shunt.   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  Patient on CellCept  and prednisone .  Dialyzed on MWF schedule.  Has AV fistula on the right upper extremity.    Acute bronchitis CXR-Subtle hazy perihilar prominence which may be due to subtle vascular congestion or acute bronchitic process Clinically, there is a suspicion for bronchitis, suspect viral etiology, afebrile, no leucocytosis.    Intermittent low-grade fever: Thought to be secondary to intracranial hemorrhage.  Previous blood cultures showed contamination.  S/p antibiotics for respiratory cultures positive for Serratia and Klebsiella.  Repeat cultures negative thus far.  Patient afebrile.   History of pancreatic transplantation: On cyclosporine , mycophenolate , prednisone    Hypertension:  Blood pressure mostly under 160 systolic.  Will continue with antihypertensives Norvasc , clonidine , Coreg , hydralazine  on non-HD days only.   Anemia of chronic disease: No signs of active bleeding.  Hemoglobin stable   Type 1 diabetes with vasculopathy/gastroparesis: Not on long-term insulin  anymore due to pancreatic transplant.  Monitor blood sugars.  On sliding scale.   Moderate protein calorie malnutrition/dysphagia: Has PEG tube.  Continue tube feeds.  SLP and dietitian following.  Patient has dry cough.  Lungs clear to auscultation.  Likely due to dysphagia.   Stage I sacral pressure ulcer present on admission: Continue wound care   Debility/deconditioning/disposition/goals of care: Young patient with multiple comorbidities.  Palliative care consulted for goals of care  conversations, family  insist she remains full code.  Palliative has signed off 10/17.  Seen by PT OT.  They recommended CIR.  Reviewed by CIR, they recommend LTAC.  They discussed with the patient's mother as well.  TOC informed.  Diarrhea: Per mother, patient had 5 loose bowel movements overnight.  Patient has no abdominal pain or tenderness.  No fever.  No leukocytosis.  Doubt C. difficile.  Will order Imodium .  DVT prophylaxis: SCDs Start: 05/19/24 1724   Code Status: Full Code  Family Communication: Mother none present at bedside.   Status is: Inpatient Remains inpatient appropriate because: Medically stable, pending placement, difficult to place.   Estimated body mass index is 20.62 kg/m as calculated from the following:   Height as of this encounter: 5' 3 (1.6 m).   Weight as of this encounter: 52.8 kg.  Wound 05/19/24 1933 Pressure Injury Buttocks Bilateral Stage 1 -  Intact skin with non-blanchable redness of a localized area usually over a bony prominence. (Active)   Nutritional Assessment: Body mass index is 20.62 kg/m.[name] [name] Seen by dietician.  I agree with the assessment and plan as outlined below: Nutrition Status: Nutrition Problem: Moderate Malnutrition Etiology: chronic illness (ESRD on HD/DM and gastroparesis) Signs/Symptoms: severe muscle depletion, mild fat depletion Interventions: Refer to RD note for recommendations  . Skin Assessment: I have examined the patient's skin and I agree with the wound assessment as performed by the wound care RN as outlined below: Wound 05/19/24 1933 Pressure Injury Buttocks Bilateral Stage 1 -  Intact skin with non-blanchable redness of a localized area usually over a bony prominence. (Active)    Consultants:  General surgery, palliative care, nephrology, neurology  Procedures:  As above  Antimicrobials:  Anti-infectives (From admission, onward)    Start     Dose/Rate Route Frequency Ordered Stop   06/18/24 2030  ceFAZolin  (ANCEF ) IVPB 2g/100 mL  premix  Status:  Discontinued        2 g 200 mL/hr over 30 Minutes Intravenous Every 8 hours 06/18/24 1454 06/19/24 1608   06/18/24 0600  ceFAZolin  (ANCEF ) IVPB 2g/100 mL premix        2 g 200 mL/hr over 30 Minutes Intravenous On call to O.R. 06/15/24 1152 06/18/24 1230   06/06/24 1400  piperacillin -tazobactam (ZOSYN ) IVPB 2.25 g        2.25 g 100 mL/hr over 30 Minutes Intravenous Every 8 hours 06/06/24 1017 06/09/24 0538   06/06/24 1200  vancomycin  (VANCOREADY) IVPB 500 mg/100 mL        500 mg 100 mL/hr over 60 Minutes Intravenous Every M-W-F (Hemodialysis) 06/04/24 1745 06/06/24 1900   06/04/24 1845  vancomycin  (VANCOCIN ) IVPB 1000 mg/200 mL premix        1,000 mg 200 mL/hr over 60 Minutes Intravenous  Once 06/04/24 1745 06/04/24 1915   06/04/24 1830  ceFEPIme  (MAXIPIME ) 2 g in sodium chloride  0.9 % 100 mL IVPB  Status:  Discontinued        2 g 200 mL/hr over 30 Minutes Intravenous Every M-W-F (Hemodialysis) 06/04/24 1743 06/06/24 1017   05/27/24 1015  cefTRIAXone  (ROCEPHIN ) 2 g in sodium chloride  0.9 % 100 mL IVPB        2 g 200 mL/hr over 30 Minutes Intravenous Every 24 hours 05/27/24 0918 05/29/24 1014   05/23/24 1530  ceFEPIme  (MAXIPIME ) 1 g in sodium chloride  0.9 % 100 mL IVPB  Status:  Discontinued        1 g 200 mL/hr over 30  Minutes Intravenous Every 24 hours 05/23/24 1433 05/27/24 0918   05/22/24 2200  Ampicillin -Sulbactam (UNASYN ) 3 g in sodium chloride  0.9 % 100 mL IVPB  Status:  Discontinued        3 g 200 mL/hr over 30 Minutes Intravenous Every 24 hours 05/22/24 0834 05/23/24 1433   05/22/24 0930  Ampicillin -Sulbactam (UNASYN ) 3 g in sodium chloride  0.9 % 100 mL IVPB        3 g 200 mL/hr over 30 Minutes Intravenous  Once 05/22/24 0834 05/22/24 1900         Subjective: Seen and examined.  Mother at the bedside.  Patient was coughing constantly.  Did not really respond to any of the questions I asked.  Diarrhea reported by mother.  Objective: Vitals:   06/23/24  2115 06/24/24 0000 06/24/24 0441 06/24/24 0443  BP: (!) 143/63 (!) 143/63 (!) 158/107   Pulse: 85 82 82   Resp:  20    Temp:  97.8 F (36.6 C) 97.8 F (36.6 C)   TempSrc:  Oral Oral   SpO2:  99% 100%   Weight:    52.8 kg  Height:        Intake/Output Summary (Last 24 hours) at 06/24/2024 0753 Last data filed at 06/24/2024 0000 Gross per 24 hour  Intake 0 ml  Output --  Net 0 ml   Filed Weights   06/23/24 0410 06/23/24 0832 06/24/24 0443  Weight: 55.9 kg 52.8 kg 52.8 kg    Examination:  General exam: Coughing Respiratory system: Clear to auscultation. Respiratory effort normal. Cardiovascular system: S1 & S2 heard, RRR. No JVD, murmurs, rubs, gallops or clicks. No pedal edema. Gastrointestinal system: Abdomen is nondistended, soft and nontender. No organomegaly or masses felt. Normal bowel sounds heard. Central nervous system: Alert and oriented x 2.  4/5 power in bilateral lower extremities. Skin: No rashes, lesions or ulcers  Data Reviewed: I have personally reviewed following labs and imaging studies  CBC: Recent Labs  Lab 06/19/24 0556 06/20/24 0526 06/21/24 0505 06/22/24 0644 06/23/24 0600  WBC 7.5 5.1 5.3 5.1 4.1  HGB 9.3* 8.9* 9.6* 9.1* 8.9*  HCT 28.6* 27.2* 29.7* 28.1* 27.9*  MCV 97.6 96.8 98.0 97.2 97.6  PLT 169 144* 167 137* 124*   Basic Metabolic Panel: Recent Labs  Lab 06/18/24 0353 06/18/24 1937 06/19/24 0556 06/20/24 0526 06/21/24 0505 06/22/24 0644 06/23/24 0600  NA 131*   < > 134* 135 133* 135 132*  K 4.9   < > 4.8 3.7 3.6 3.4* 4.0  CL 87*   < > 92* 90* 90* 88* 88*  CO2 26   < > 23 26 26 29 26   GLUCOSE 106*   < > 115* 135* 140* 141* 146*  BUN 57*   < > 79* 44* 30* 66* 97*  CREATININE 3.56*   < > 5.00* 3.24* 2.24* 4.10* 5.36*  CALCIUM  11.0*   < > 11.3* 11.0* 10.7* 11.1* 10.9*  MG 3.1*  --   --   --   --   --   --   PHOS 5.1*  --  7.1* 4.8* 3.6 4.5 4.6   < > = values in this interval not displayed.   GFR: Estimated Creatinine  Clearance: 11.3 mL/min (A) (by C-G formula based on SCr of 5.36 mg/dL (H)). Liver Function Tests: No results for input(s): AST, ALT, ALKPHOS, BILITOT, PROT, ALBUMIN  in the last 168 hours. No results for input(s): LIPASE, AMYLASE in the last 168 hours. No results  for input(s): AMMONIA in the last 168 hours. Coagulation Profile: Recent Labs  Lab 06/18/24 0353  INR 1.1   Cardiac Enzymes: No results for input(s): CKTOTAL, CKMB, CKMBINDEX, TROPONINI in the last 168 hours. BNP (last 3 results) No results for input(s): PROBNP in the last 8760 hours. HbA1C: No results for input(s): HGBA1C in the last 72 hours. CBG: Recent Labs  Lab 06/23/24 1544 06/23/24 2029 06/24/24 0015 06/24/24 0431 06/24/24 0634  GLUCAP 149* 147* 117* 130* 131*   Lipid Profile: No results for input(s): CHOL, HDL, LDLCALC, TRIG, CHOLHDL, LDLDIRECT in the last 72 hours. Thyroid Function Tests: No results for input(s): TSH, T4TOTAL, FREET4, T3FREE, THYROIDAB in the last 72 hours. Anemia Panel: No results for input(s): VITAMINB12, FOLATE, FERRITIN, TIBC, IRON , RETICCTPCT in the last 72 hours. Sepsis Labs: No results for input(s): PROCALCITON, LATICACIDVEN in the last 168 hours.  No results found for this or any previous visit (from the past 240 hours).   Radiology Studies: DG Chest Port 1 View Result Date: 06/22/2024 CLINICAL DATA:  Cough. EXAM: PORTABLE CHEST 1 VIEW COMPARISON:  06/04/2024 FINDINGS: Ventriculostomy catheter over the right neck, chest and abdomen. Three skin staples over the right upper chest. Several surgical clips over the lateral right upper chest. Lungs are adequately inflated without airspace consolidation or effusion. Subtle hazy perihilar prominence which may be due to subtle vascular congestion or acute bronchitic process. Cardiomediastinal silhouette and remainder of the exam is unchanged. IMPRESSION: Subtle hazy perihilar  prominence which may be due to subtle vascular congestion or acute bronchitic process. Electronically Signed   By: Toribio Agreste M.D.   On: 06/22/2024 10:24    Scheduled Meds:  carvedilol   25 mg Per Tube 2 times per day on Sunday Monday Wednesday Friday   Chlorhexidine  Gluconate Cloth  6 each Topical Q0600   cloNIDine   0.3 mg Per Tube Once per day on Sunday Monday Wednesday Friday   cycloSPORINE   200 mg Per Tube QPM   cycloSPORINE   225 mg Per Tube Daily   darbepoetin (ARANESP ) injection - DIALYSIS  100 mcg Subcutaneous Q Wed-1800   fiber  1 packet Per Tube TID   insulin  aspart  0-9 Units Subcutaneous Q4H   lacosamide   100 mg Per Tube BID   multivitamin with minerals  1 tablet Per Tube Daily   mycophenolate   500 mg Per Tube BID   mouth rinse  15 mL Mouth Rinse 4 times per day   oxidized cellulose  1 each Topical Once   pantoprazole  (PROTONIX ) IV  40 mg Intravenous QHS   predniSONE   15 mg Per Tube Q breakfast   Continuous Infusions:  feeding supplement (NEPRO CARB STEADY) 1,000 mL (06/23/24 1454)     LOS: 36 days   Fredia Skeeter, MD [name]  06/24/2024, 7:53 AM   *Please note that this is a verbal dictation therefore any spelling or grammatical errors are due to the Dragon Medical One system interpretation.  Please page via Amion and do not message via secure chat for urgent patient care matters. Secure chat can be used for non urgent patient care matters.  How to contact the [facility] Attending or Consulting provider 7A - 7P or covering provider during after hours 7P -7A, for this patient?  Check the care team in [facility] and look for a) attending/consulting [facility] provider listed and b) the [facility] team listed. Page or secure chat 7A-7P. Log into [email or web address] and use [facility] universal password to access. If you do not have the  password, please contact the hospital operator. Locate the [facility] provider you are looking for under [name] and page to a number that you can be  directly reached. If you still have difficulty reaching the provider, please page the [facility] (Director on Call) for the Hospitalists listed on amion for assistance.

## 2024-06-25 DIAGNOSIS — I619 Nontraumatic intracerebral hemorrhage, unspecified: Secondary | ICD-10-CM | POA: Diagnosis not present

## 2024-06-25 LAB — GLUCOSE, CAPILLARY
Glucose-Capillary: 108 mg/dL — ABNORMAL HIGH (ref 70–99)
Glucose-Capillary: 136 mg/dL — ABNORMAL HIGH (ref 70–99)
Glucose-Capillary: 137 mg/dL — ABNORMAL HIGH (ref 70–99)
Glucose-Capillary: 152 mg/dL — ABNORMAL HIGH (ref 70–99)
Glucose-Capillary: 157 mg/dL — ABNORMAL HIGH (ref 70–99)

## 2024-06-25 LAB — RENAL FUNCTION PANEL
Albumin: 3.4 g/dL — ABNORMAL LOW (ref 3.5–5.0)
Anion gap: 14 (ref 5–15)
BUN: 86 mg/dL — ABNORMAL HIGH (ref 6–20)
CO2: 25 mmol/L (ref 22–32)
Calcium: 10 mg/dL (ref 8.9–10.3)
Chloride: 88 mmol/L — ABNORMAL LOW (ref 98–111)
Creatinine, Ser: 4.84 mg/dL — ABNORMAL HIGH (ref 0.44–1.00)
GFR, Estimated: 11 mL/min — ABNORMAL LOW (ref >60)
Glucose, Bld: 146 mg/dL — ABNORMAL HIGH (ref 70–99)
Phosphorus: 4.6 mg/dL (ref 2.5–4.6)
Potassium: 5 mmol/L (ref 3.5–5.1)
Sodium: 127 mmol/L — ABNORMAL LOW (ref 135–145)

## 2024-06-25 LAB — CBC
HCT: 26.2 % — ABNORMAL LOW (ref 36.0–46.0)
Hemoglobin: 8.6 g/dL — ABNORMAL LOW (ref 12.0–15.0)
MCH: 30.9 pg (ref 26.0–34.0)
MCHC: 32.8 g/dL (ref 30.0–36.0)
MCV: 94.2 fL (ref 80.0–100.0)
Platelets: 120 K/uL — ABNORMAL LOW (ref 150–400)
RBC: 2.78 MIL/uL — ABNORMAL LOW (ref 3.87–5.11)
RDW: 14.2 % (ref 11.5–15.5)
WBC: 4 K/uL (ref 4.0–10.5)
nRBC: 0 % (ref 0.0–0.2)

## 2024-06-25 MED ORDER — CINACALCET HCL 30 MG PO TABS
30.0000 mg | ORAL_TABLET | ORAL | Status: DC
  Administered 2024-06-25 – 2024-08-27 (×25): 30 mg via ORAL
  Filled 2024-06-25 (×28): qty 1

## 2024-06-25 MED ORDER — AMLODIPINE BESYLATE 5 MG PO TABS
5.0000 mg | ORAL_TABLET | Freq: Every day | ORAL | Status: DC
  Filled 2024-06-25: qty 1

## 2024-06-25 NOTE — Progress Notes (Signed)
.  jPLAN: Patient ID: [name] [name], female   DOB: 07-Feb-1981, 43 y.o.   MRN: [id number] Subjective: The patient is alert and in no apparent distress.  Her mother is at the bedside.  Objective: Vital signs in last 24 hours: Temp:  [97.7 F (36.5 C)-98.7 F (37.1 C)] 98.3 F (36.8 C) (10/22 0746) Pulse Rate:  [82-88] 85 (10/22 0746) Resp:  [18-21] 18 (10/22 0746) BP: (120-168)/(65-95) 157/85 (10/22 0746) SpO2:  [94 %-98 %] 94 % (10/22 0746) Estimated body mass index is 20.62 kg/m as calculated from the following:   Height as of this encounter: 5' 3 (1.6 m).   Weight as of this encounter: 52.8 kg.   Intake/Output from previous day: No intake/output data recorded. Intake/Output this shift: No intake/output data recorded.  Physical exam the patient is alert.  She will answer questions.  Her shunt incisions are healing well.  Lab Results: Recent Labs    06/23/24 0600 06/24/24 0827  WBC 4.1 4.9  HGB 8.9* 9.5*  HCT 27.9* 28.4*  PLT 124* 135*   BMET Recent Labs    06/23/24 0600  NA 132*  K 4.0  CL 88*  CO2 26  GLUCOSE 146*  BUN 97*  CREATININE 5.36*  CALCIUM  10.9*    Studies/Results: No results found.  Assessment/Plan: Postop day #7: The patient is doing well.  He may benefit from rehab.  I will ask the rehab team to see her.  LOS: 37 days     [name] [name] 06/25/2024, 8:00 AM

## 2024-06-25 NOTE — Progress Notes (Signed)
   Inpatient Rehabilitation Admissions Coordinator   Rehab consult received. Dr Carilyn consulted on 10/20 and recommended LTACH. We will sign off.  Heron Leavell, RN, MSN Rehab Admissions Coordinator [phone] 06/25/2024 8:11 AM]

## 2024-06-25 NOTE — Progress Notes (Signed)
 Occupational Therapy Treatment Patient Details Name: [name] [name] MRN: [id number] DOB: 1981/08/14 Today's Date: 06/25/2024   History of present illness 43 yo female presents 05/19/24 for left gaze and L side weakness. 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 Intubated 9/15-9/23. EVD 9/15- 10/15; s/p shunt 10/15. S/p laparoscopy with intra-abdominal assistance ventricular-peritoneal shunt placement 10/15. PMH: HTN, ESRD on HD s/p renal and pancreatic transplants, DM, gastroparesis, multiple strokes (ischemic and hemorrhagic) with prepontine SAH, hydrocephalus requiring EVD placement   OT comments  Pt progressing toward established OT goals. Challenging strength, balance, cognition, and vision this session with functional grooming task EOB, visual tracking, and transfer training. Pt needing max assist for transfers, min A for brushing teeth and min A for sitting balance. Pt with difficulty getting comfortable sitting Eob with constant attempts to sit with LE on EOB. Pt continues to present with decreased cognition, strength, balance, safety, command following. Noting rehab deferred to different venue of care; recommending LTACH for follow up [email or web address]         If plan is discharge home, recommend the following:  Two people to help with walking and/or transfers;Two people to help with bathing/dressing/bathroom;Assistance with cooking/housework;Assist for transportation;Help with stairs or ramp for entrance   Equipment Recommendations  Other (comment) (TBD)    Recommendations for Other Services      Precautions / Restrictions Precautions Precautions: Fall Recall of Precautions/Restrictions: Impaired Precaution/Restrictions Comments: LUQ PEG Restrictions Weight Bearing Restrictions Per Provider Order: No       Mobility Bed Mobility Overal bed mobility: Needs  Assistance Bed Mobility: Rolling, Sidelying to Sit, Sit to Supine Rolling: Mod assist Sidelying to sit: Max assist   Sit to supine: Mod assist   General bed mobility comments: dense cues throughout    Transfers Overall transfer level: Needs assistance Equipment used: Ambulation equipment used Transfers: Sit to/from Stand Sit to Stand: Max assist, Via lift equipment           General transfer comment: max A with OT providing direct multimodal cues for set-up of transfer. Transfer via Lift Equipment: Stedy   Balance Overall balance assessment: Needs assistance Sitting-balance support: Feet supported Sitting balance-Leahy Scale: Poor Sitting balance - Comments: CGA-minA   Standing balance support: Bilateral upper extremity supported Standing balance-Leahy Scale: Zero                             ADL either performed or assessed with clinical judgement   ADL Overall ADL's : Needs assistance/impaired Eating/Feeding: NPO   Grooming: Minimal assistance;Sitting;Oral care Grooming Details (indicate cue type and reason): with set-up of suction toothbrush                 Toilet Transfer: Maximal assistance Toilet Transfer Details (indicate cue type and reason): STS x 3 with stedy, max multimodal cues for upright position with head in flexed position         Functional mobility during ADLs: Maximal assistance      Extremity/Trunk Assessment Upper Extremity Assessment Upper Extremity Assessment: Generalized weakness;RUE deficits/detail;LUE deficits/detail RUE Deficits / Details: actively uses for oral care, generally weak. does not follow commands for MMT; able to reach up and sustain grasp on stedy bar LUE Deficits / Details: moves spontaneously against gravity undershoots during reaching tasks, needs support to maintain grasp on stedy bar during STS transfers . not following commands  for MMT   Lower Extremity Assessment Lower Extremity Assessment: Defer  to PT evaluation        Vision   Vision Assessment?: Vision impaired- to be further tested in functional context Additional Comments: dysconjugate gaze at rest. On all attempts to ask how many fingers therapist was holding up, pt reporting 3 despite acutal number not 3 (1,2). pt able to track eye level to L but not past midline.   Perception     Praxis Praxis Praxis: Impaired Praxis Impairment Details: Motor planning   Communication Communication Communication: No apparent difficulties   Cognition Arousal: Lethargic Behavior During Therapy: Flat affect Cognition: Cognition impaired, Difficult to assess Difficult to assess due to: Level of arousal   Awareness: Intellectual awareness impaired, Online awareness impaired   Attention impairment (select first level of impairment): Focused attention Executive functioning impairment (select all impairments): Initiation, Organization, Sequencing, Problem solving OT - Cognition Comments: follows one step commands with multimodal cueing                 Following commands: Impaired Following commands impaired: Follows one step commands inconsistently      Cueing   Cueing Techniques: Verbal cues, Gestural cues, Tactile cues  Exercises Exercises: Other exercises Other Exercises Other Exercises: visual tracking x 3    Shoulder Instructions       General Comments mother present and supportive    Pertinent Vitals/ Pain       Pain Assessment Pain Assessment: Faces Faces Pain Scale: Hurts even more Pain Location: back and stomach Pain Descriptors / Indicators: Grimacing, Guarding, Other (Comment) (also itching all over) Pain Intervention(s): Limited activity within patient's tolerance, Monitored during session  Home Living                                          Prior Functioning/Environment              Frequency  Min 2X/week        Progress Toward Goals  OT Goals(current goals can now  be found in the care plan section)  Progress towards OT goals: Progressing toward goals  Acute Rehab OT Goals OT Goal Formulation: Patient unable to participate in goal setting Time For Goal Achievement: 07/05/24 Potential to Achieve Goals: Good ADL Goals Pt Will Perform Grooming: with set-up;sitting Pt Will Perform Lower Body Dressing: with mod assist;sit to/from stand Pt Will Transfer to Toilet: with mod assist;stand pivot transfer Pt/caregiver will Perform Home Exercise Program: Both right and left upper extremity;With written HEP provided;Increased strength Additional ADL Goal #1: pt will follow one step commands 100% of OT session to optimize functional participation in BADL  Plan      Co-evaluation                 AM-PAC OT 6 Clicks Daily Activity     Outcome Measure   Help from another person eating meals?: Total Help from another person taking care of personal grooming?: A Lot Help from another person toileting, which includes using toliet, bedpan, or urinal?: Total Help from another person bathing (including washing, rinsing, drying)?: Total Help from another person to put on and taking off regular upper body clothing?: A Lot Help from another person to put on and taking off regular lower body clothing?: Total 6 Click Score: 8    End of Session    OT Visit Diagnosis: Unsteadiness  on feet (R26.81);Muscle weakness (generalized) (M62.81);History of falling (Z91.81);Other symptoms and signs involving cognitive function;Cognitive communication deficit (R41.841);Low vision, both eyes (H54.2)   Activity Tolerance Patient tolerated treatment well   Patient Left in bed;with call bell/phone within reach;with bed alarm set;with family/visitor present   Nurse Communication Mobility status        Time: 9061-8986 OT Time Calculation (min): 35 min  Charges: OT General Charges $OT Visit: 1 Visit OT Treatments $Self Care/Home Management : 8-22 mins $Therapeutic  Activity: 8-22 mins  Elma JONETTA Penner, OTD, OTR/L [facility] Acute Rehabilitation Office: [phone]   Elma JONETTA Penner 06/25/2024, 2:34 PM

## 2024-06-25 NOTE — Progress Notes (Signed)
 PROGRESS NOTE    [name] [name]  FMW:[id number] DOB: 02-02-1981 DOA: 05/19/2024 PCP: [facility]   Brief Narrative:  [name] with PMH HTN, ischemic and hemorrhagic CVA, type 1 diabetes, gastroparesis, kidney and pancreas transplant 2014, subsequent failure of renal transplant, now ESRD on dialysis, anxiety, narcotic abuse.  Presented to the [facility] ED from dialysis as a code stroke on 9/15; subsequently found to have hemorrhagic stroke.  Neurology, neurosurgery, PCCM consulted.  EVD was placed on the left side.  Patient admitted to neuro ICU.  She was intubated on admission.  She had prolonged ICU stay with EVD drain which was changed to VP shunt on 10/15.  Extubated on 9/23.  Transferred to [facility] service on 10/17.     She was recently discharged from here in August after she was managed for ischemic/hemorrhagic stroke and was discharged to LTAC.    Important events:  9/15 admitted with ICH plus IVH, EVD was placed for hydrocephalus, intubated 9/22 palliative care meeting.  Family is insistent that they want full scope of care 9/23 Extubated 10/1 -developed fevers pancultures drawn and broad-spectrum antibiotics started 10/2 became afebrile after starting antibiotics, did not tolerate raising EVD 10/6 EVD was raised from 10 to 30 cm water , tolerating well, remained afebrile 10/7 EVD is at 30 cm water , CT scan will be done tomorrow morning, no overnight issues 10/8 she became somnolent, CT head showed increasing hydrocephalus, EVD was titrated down to 10 cm of water  with improvement in mental status 10/15 VP shunt placement 10/17 transferred to progressive unit with [name]   Assessment & Plan:   Principal Problem:   Hemorrhagic stroke [facility]) Active Problems:   Moderate protein-calorie malnutrition   Pressure injury of skin   Nontraumatic subcortical hemorrhage of right cerebral hemisphere [facility])  Acute right parietotemporal intraparenchymal  hemorrhage/intraventricular hemorrhage Uncontrolled hypertension Obstructive hydrocephalus s/p EVD changed to VP shunt Seizure disorder s/p VP shunt placement 06/18/2024 by Dr. Mavis.  Remains stable on Vimpat  100 mg twice daily.  Neurosurgery following.  Per them, no issue after the shunt.   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  Patient on CellCept  and prednisone .  Dialyzed on MWF schedule.  Has AV fistula on the right upper extremity.    Acute bronchitis Resolved.    Intermittent low-grade fever: Thought to be secondary to intracranial hemorrhage.  Previous blood cultures showed contamination.  S/p antibiotics for respiratory cultures positive for Serratia and Klebsiella.  Repeat cultures negative thus far.  Patient afebrile for several days.   History of pancreatic transplantation: On cyclosporine , mycophenolate , prednisone    Hypertension:  Patient currently only on Coreg  and clonidine  on nondialysis days.  Patient's blood pressure is now rising above 160 systolic.  Will start her on amlodipine  5 mg daily.     Anemia of chronic disease: No signs of active bleeding.  Hemoglobin stable   Type 1 diabetes with vasculopathy/gastroparesis: Not on long-term insulin  anymore due to pancreatic transplant.  Monitor blood sugars.  On sliding scale.   Moderate protein calorie malnutrition/dysphagia: Has PEG tube.  Continue tube feeds.  SLP and dietitian following.  Patient has dry cough.  Lungs clear to auscultation.  Likely due to dysphagia.   Stage I sacral pressure ulcer present on admission: Continue wound care   Debility/deconditioning/disposition/goals of care: Young patient with multiple comorbidities.  Palliative care consulted for goals of care conversations, family insist she remains full code.  Palliative has signed off 10/17.  Seen by PT OT.  They recommended CIR.  Reviewed by CIR, they recommend LTAC.  I was asked to  do peer to peer with insurance company today 06/25/2024, I completed that, they denied LTAC.  They recommended LTC.  I have informed TOC and LTAC representative.  Diarrhea: Improved with Imodium .  DVT prophylaxis: SCDs Start: 05/19/24 1724   Code Status: Full Code  Family Communication: Mother none present at bedside.   Status is: Inpatient Remains inpatient appropriate because: Medically stable, pending placement, difficult to place.   Estimated body mass index is 20.62 kg/m as calculated from the following:   Height as of this encounter: 5' 3 (1.6 m).   Weight as of this encounter: 52.8 kg.  Wound 05/19/24 1933 Pressure Injury Buttocks Bilateral Stage 1 -  Intact skin with non-blanchable redness of a localized area usually over a bony prominence. (Active)   Nutritional Assessment: Body mass index is 20.62 kg/m.[name] [name] Seen by dietician.  I agree with the assessment and plan as outlined below: Nutrition Status: Nutrition Problem: Moderate Malnutrition Etiology: chronic illness (ESRD on HD/DM and gastroparesis) Signs/Symptoms: severe muscle depletion, mild fat depletion Interventions: Refer to RD note for recommendations  . Skin Assessment: I have examined the patient's skin and I agree with the wound assessment as performed by the wound care RN as outlined below: Wound 05/19/24 1933 Pressure Injury Buttocks Bilateral Stage 1 -  Intact skin with non-blanchable redness of a localized area usually over a bony prominence. (Active)    Consultants:  General surgery, palliative care, nephrology, neurology  Procedures:  As above  Antimicrobials:  Anti-infectives (From admission, onward)    Start     Dose/Rate Route Frequency Ordered Stop   06/18/24 2030  ceFAZolin  (ANCEF ) IVPB 2g/100 mL premix  Status:  Discontinued        2 g 200 mL/hr over 30 Minutes Intravenous Every 8 hours 06/18/24 1454 06/19/24 1608   06/18/24 0600  ceFAZolin  (ANCEF ) IVPB 2g/100 mL premix        2 g 200  mL/hr over 30 Minutes Intravenous On call to O.R. 06/15/24 1152 06/18/24 1230   06/06/24 1400  piperacillin -tazobactam (ZOSYN ) IVPB 2.25 g        2.25 g 100 mL/hr over 30 Minutes Intravenous Every 8 hours 06/06/24 1017 06/09/24 0538   06/06/24 1200  vancomycin  (VANCOREADY) IVPB 500 mg/100 mL        500 mg 100 mL/hr over 60 Minutes Intravenous Every M-W-F (Hemodialysis) 06/04/24 1745 06/06/24 1900   06/04/24 1845  vancomycin  (VANCOCIN ) IVPB 1000 mg/200 mL premix        1,000 mg 200 mL/hr over 60 Minutes Intravenous  Once 06/04/24 1745 06/04/24 1915   06/04/24 1830  ceFEPIme  (MAXIPIME ) 2 g in sodium chloride  0.9 % 100 mL IVPB  Status:  Discontinued        2 g 200 mL/hr over 30 Minutes Intravenous Every M-W-F (Hemodialysis) 06/04/24 1743 06/06/24 1017   05/27/24 1015  cefTRIAXone  (ROCEPHIN ) 2 g in sodium chloride  0.9 % 100 mL IVPB        2 g 200 mL/hr over 30 Minutes Intravenous Every 24 hours 05/27/24 0918 05/29/24 1014   05/23/24 1530  ceFEPIme  (MAXIPIME ) 1 g in sodium chloride  0.9 % 100 mL IVPB  Status:  Discontinued        1 g 200 mL/hr over 30 Minutes Intravenous Every 24 hours 05/23/24 1433 05/27/24 0918   05/22/24 2200  Ampicillin -Sulbactam (UNASYN ) 3 g in sodium chloride  0.9 %  100 mL IVPB  Status:  Discontinued        3 g 200 mL/hr over 30 Minutes Intravenous Every 24 hours 05/22/24 0834 05/23/24 1433   05/22/24 0930  Ampicillin -Sulbactam (UNASYN ) 3 g in sodium chloride  0.9 % 100 mL IVPB        3 g 200 mL/hr over 30 Minutes Intravenous  Once 05/22/24 0834 05/22/24 1900         Subjective: Patient seen and examined.  Patient is more calm and awake today.  Mother at the bedside.  Patient was able to respond to my questions and said that she was feeling fine other than having some itching.  Objective: Vitals:   06/25/24 0000 06/25/24 0338 06/25/24 0746 06/25/24 1122  BP: 126/65 (!) 159/70 (!) 157/85 (!) 168/84  Pulse: 85 82 85 88  Resp:  20 18 18   Temp: 97.9 F (36.6 C) 97.9  F (36.6 C) 98.3 F (36.8 C) 97.6 F (36.4 C)  TempSrc: Axillary Axillary Oral Oral  SpO2: 98% 98% 94% 95%  Weight:      Height:        Intake/Output Summary (Last 24 hours) at 06/25/2024 1151 Last data filed at 06/24/2024 1953 Gross per 24 hour  Intake 0 ml  Output --  Net 0 ml   Filed Weights   06/23/24 0410 06/23/24 0832 06/24/24 0443  Weight: 55.9 kg 52.8 kg 52.8 kg    Examination: General exam: Appears calm and comfortable  Respiratory system: Clear to auscultation. Respiratory effort normal. Cardiovascular system: S1 & S2 heard, RRR. No JVD, murmurs, rubs, gallops or clicks. No pedal edema. Gastrointestinal system: Abdomen is nondistended, soft and nontender. No organomegaly or masses felt. Normal bowel sounds heard. Central nervous system: Alert and oriented x 2.  4/5 power in bilateral lower extremity.    Data Reviewed: I have personally reviewed following labs and imaging studies  CBC: Recent Labs  Lab 06/20/24 0526 06/21/24 0505 06/22/24 0644 06/23/24 0600 06/24/24 0827  WBC 5.1 5.3 5.1 4.1 4.9  NEUTROABS  --   --   --   --  3.4  HGB 8.9* 9.6* 9.1* 8.9* 9.5*  HCT 27.2* 29.7* 28.1* 27.9* 28.4*  MCV 96.8 98.0 97.2 97.6 94.0  PLT 144* 167 137* 124* 135*   Basic Metabolic Panel: Recent Labs  Lab 06/19/24 0556 06/20/24 0526 06/21/24 0505 06/22/24 0644 06/23/24 0600  NA 134* 135 133* 135 132*  K 4.8 3.7 3.6 3.4* 4.0  CL 92* 90* 90* 88* 88*  CO2 23 26 26 29 26   GLUCOSE 115* 135* 140* 141* 146*  BUN 79* 44* 30* 66* 97*  CREATININE 5.00* 3.24* 2.24* 4.10* 5.36*  CALCIUM  11.3* 11.0* 10.7* 11.1* 10.9*  PHOS 7.1* 4.8* 3.6 4.5 4.6   GFR: Estimated Creatinine Clearance: 11.3 mL/min (A) (by C-G formula based on SCr of 5.36 mg/dL (H)). Liver Function Tests: No results for input(s): AST, ALT, ALKPHOS, BILITOT, PROT, ALBUMIN  in the last 168 hours. No results for input(s): LIPASE, AMYLASE in the last 168 hours. No results for input(s):  AMMONIA in the last 168 hours. Coagulation Profile: No results for input(s): INR, PROTIME in the last 168 hours.  Cardiac Enzymes: No results for input(s): CKTOTAL, CKMB, CKMBINDEX, TROPONINI in the last 168 hours. BNP (last 3 results) No results for input(s): PROBNP in the last 8760 hours. HbA1C: No results for input(s): HGBA1C in the last 72 hours. CBG: Recent Labs  Lab 06/24/24 2044 06/25/24 0036 06/25/24 0328 06/25/24 0741 06/25/24  1120  GLUCAP 171* 136* 137* 152* 157*   Lipid Profile: No results for input(s): CHOL, HDL, LDLCALC, TRIG, CHOLHDL, LDLDIRECT in the last 72 hours. Thyroid Function Tests: No results for input(s): TSH, T4TOTAL, FREET4, T3FREE, THYROIDAB in the last 72 hours. Anemia Panel: No results for input(s): VITAMINB12, FOLATE, FERRITIN, TIBC, IRON , RETICCTPCT in the last 72 hours. Sepsis Labs: No results for input(s): PROCALCITON, LATICACIDVEN in the last 168 hours.  No results found for this or any previous visit (from the past 240 hours).   Radiology Studies: No results found.   Scheduled Meds:  carvedilol   25 mg Per Tube 2 times per day on Sunday Tuesday Thursday Saturday   Chlorhexidine  Gluconate Cloth  6 each Topical Q0600   cinacalcet   30 mg Oral Q M,W,F   cloNIDine   0.3 mg Per Tube Once per day on Sunday Tuesday Thursday Saturday   cycloSPORINE   200 mg Per Tube QPM   cycloSPORINE   225 mg Per Tube Daily   darbepoetin (ARANESP ) injection - DIALYSIS  100 mcg Subcutaneous Q Wed-1800   fiber  1 packet Per Tube TID   insulin  aspart  0-9 Units Subcutaneous Q4H   lacosamide   100 mg Per Tube BID   multivitamin with minerals  1 tablet Per Tube Daily   mycophenolate   500 mg Per Tube BID   mouth rinse  15 mL Mouth Rinse 4 times per day   oxidized cellulose  1 each Topical Once   pantoprazole  (PROTONIX ) IV  40 mg Intravenous QHS   predniSONE   15 mg Per Tube Q breakfast   Continuous Infusions:   feeding supplement (NEPRO CARB STEADY) 1,000 mL (06/23/24 1454)     LOS: 37 days   Fredia Skeeter, MD [name]  06/25/2024, 11:51 AM   *Please note that this is a verbal dictation therefore any spelling or grammatical errors are due to the Dragon Medical One system interpretation.  Please page via Amion and do not message via secure chat for urgent patient care matters. Secure chat can be used for non urgent patient care matters.  How to contact the [facility] Attending or Consulting provider 7A - 7P or covering provider during after hours 7P -7A, for this patient?  Check the care team in [facility] and look for a) attending/consulting [facility] provider listed and b) the [facility] team listed. Page or secure chat 7A-7P. Log into [email or web address] and use [facility]'s universal password to access. If you do not have the password, please contact the hospital operator. Locate the [facility] provider you are looking for under [name] and page to a number that you can be directly reached. If you still have difficulty reaching the provider, please page the [facility] (Director on Call) for the Hospitalists listed on amion for assistance.

## 2024-06-25 NOTE — TOC Progression Note (Signed)
 Transition of Care [facility]) - Progression Note    Patient Details  Name: [name] [name] MRN: [id number] Date of Birth: 11-23-1980  [facility] of [facility]) CM/SW Contact  [name], RN Phone Number: 06/25/2024, 11:32 AM  Clinical Narrative:     Insurance requested a peer to peer and they denied LTACH. Pt is appealing.  IP Care management following.  Expected Discharge Plan: Long Term Acute Care (LTAC) Barriers to Discharge: Continued Medical Work up, Air traffic controller and Services In-house Referral: Clinical Social Work   Post Acute Care Choice: Skilled Nursing Facility Living arrangements for the past 2 months: Apartment                                       Social Drivers of Health (SDOH) Interventions SDOH Screenings   Food Insecurity: Patient Unable To Answer (05/20/2024)  Housing: Patient Unable To Answer (05/20/2024)  Transportation Needs: Patient Unable To Answer (05/20/2024)  Utilities: Patient Unable To Answer (05/20/2024)  Social Connections: Unknown (10/04/2022)   Received from [facility]  Stress: No Stress Concern Present (04/16/2024)   Received from [facility]  Tobacco Use: Medium Risk (06/18/2024)    Readmission Risk Interventions    05/20/2024    4:16 PM 03/31/2024   11:27 AM 11/17/2021   12:15 PM  Readmission Risk Prevention Plan  Transportation Screening Complete Complete Complete  Medication Review Oceanographer) Complete Complete Complete  PCP or Specialist appointment within 3-5 days of discharge Complete  Complete  HRI or Home Care Consult Complete Complete Complete  SW Recovery Care/Counseling Consult Complete Complete Complete  Palliative Care Screening Complete Not Applicable Not Applicable  Skilled Nursing Facility Complete Not Applicable Patient Refused

## 2024-06-25 NOTE — Progress Notes (Signed)
 Patient ID: [name] [name], female   DOB: 1981/06/21, 43 y.o.   MRN: [id number] [facility] Progress Note   Assessment/ Plan:   1.  Acute right intracranial hemorrhage: Associated with uncontrolled hypertension and status post conversion from EVD to VP shunt on 06/18/2024 by neurosurgery.  Mental status appears to be improving but she continues to require total assistance for PT/OT.  Recommendations made for LTAC placement after failing to meet criteria for CIR admission. 2. ESRD following failure of renal allograft: Continue hemodialysis on Monday/Wednesday/Friday schedule via right BCF with hemodialysis again scheduled for today.  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  Euvolemic on exam. 3. Anemia: Hemoglobin/hematocrit continue to remain stable without any overt blood loss.  She remains on Aranesp  100 mcg weekly. 4. CKD-MBD: With marginally elevated calcium  that is suspected to be largely from immobility.  Status post pamidronate on 06/15/2024 and started on Cinacalcet  30 mg 3 days a week. 5. Nutrition: Currently on tube feeds with Nepro along with renal MVI. 6. Hypertension: Intermittently elevated blood pressures noted on the current regimen, will continue to follow with hemodialysis/UF.  Subjective:   Without acute events overnight, mother weighing her options for LTAC placement.   Objective:   BP (!) 157/85 (BP Location: Left Arm)   Pulse 85   Temp 98.3 F (36.8 C) (Oral)   Resp 18   Ht 5' 3 (1.6 m)   Wt 52.8 kg   SpO2 94%   BMI 20.62 kg/m   Physical Exam: Gen: Awake and fidgeting, responds to basic questions.  Mother at bedside CVS: Pulse regular rhythm, normal rate, S1 and S2 normal Resp: Anteriorly clear to auscultation, no rales/rhonchi Abd: Soft, flat, nontender, bowel sounds normal Ext: No lower extremity edema.  Right  upper arm AV fistula with palpable thrill Labs: BMET Recent Labs  Lab 06/18/24 1937 06/19/24 0556 06/20/24 0526 06/21/24 0505 06/22/24 0644 06/23/24 0600  NA 132* 134* 135 133* 135 132*  K 4.7 4.8 3.7 3.6 3.4* 4.0  CL 90* 92* 90* 90* 88* 88*  CO2 23 23 26 26 29 26   GLUCOSE 153* 115* 135* 140* 141* 146*  BUN 67* 79* 44* 30* 66* 97*  CREATININE 4.47* 5.00* 3.24* 2.24* 4.10* 5.36*  CALCIUM  10.7* 11.3* 11.0* 10.7* 11.1* 10.9*  PHOS  --  7.1* 4.8* 3.6 4.5 4.6   CBC Recent Labs  Lab 06/21/24 0505 06/22/24 0644 06/23/24 0600 06/24/24 0827  WBC 5.3 5.1 4.1 4.9  NEUTROABS  --   --   --  3.4  HGB 9.6* 9.1* 8.9* 9.5*  HCT 29.7* 28.1* 27.9* 28.4*  MCV 98.0 97.2 97.6 94.0  PLT 167 137* 124* 135*      Medications:     carvedilol   25 mg Per Tube 2 times per day on Sunday Tuesday Thursday Saturday   Chlorhexidine  Gluconate Cloth  6 each Topical Q0600   cloNIDine   0.3 mg Per Tube Once per day on Sunday Tuesday Thursday Saturday   cycloSPORINE   200 mg Per Tube QPM   cycloSPORINE   225 mg Per Tube Daily   darbepoetin (ARANESP ) injection - DIALYSIS  100 mcg Subcutaneous Q Wed-1800   fiber  1 packet Per Tube TID   insulin  aspart  0-9 Units Subcutaneous Q4H   lacosamide   100 mg Per Tube BID   multivitamin with minerals  1 tablet Per Tube Daily   mycophenolate   500  mg Per Tube BID   mouth rinse  15 mL Mouth Rinse 4 times per day   oxidized cellulose  1 each Topical Once   pantoprazole  (PROTONIX ) IV  40 mg Intravenous QHS   predniSONE   15 mg Per Tube Q breakfast   Gordy Blanch, MD 06/25/2024, 8:37 AM

## 2024-06-25 NOTE — Progress Notes (Signed)
 Received patient in bed.Alert and oriented to self.She followed command. Consent for hd treatment verified.  Access used: Right arm avf that worked well.  Duration of treatment: 3 hours. Net uf: 1.5 L,tolerated treatment.  Hand off to the p[atient's nurse,back into her room with stable vitals via transporter.

## 2024-06-26 DIAGNOSIS — I619 Nontraumatic intracerebral hemorrhage, unspecified: Secondary | ICD-10-CM | POA: Diagnosis not present

## 2024-06-26 LAB — GLUCOSE, CAPILLARY
Glucose-Capillary: 123 mg/dL — ABNORMAL HIGH (ref 70–99)
Glucose-Capillary: 123 mg/dL — ABNORMAL HIGH (ref 70–99)
Glucose-Capillary: 134 mg/dL — ABNORMAL HIGH (ref 70–99)
Glucose-Capillary: 140 mg/dL — ABNORMAL HIGH (ref 70–99)
Glucose-Capillary: 143 mg/dL — ABNORMAL HIGH (ref 70–99)
Glucose-Capillary: 145 mg/dL — ABNORMAL HIGH (ref 70–99)
Glucose-Capillary: 185 mg/dL — ABNORMAL HIGH (ref 70–99)

## 2024-06-26 MED ORDER — AMLODIPINE BESYLATE 5 MG PO TABS
5.0000 mg | ORAL_TABLET | ORAL | Status: DC
  Administered 2024-06-26 – 2024-06-29 (×3): 5 mg via ORAL
  Filled 2024-06-26 (×4): qty 1

## 2024-06-26 MED ORDER — GABAPENTIN 100 MG PO CAPS
100.0000 mg | ORAL_CAPSULE | Freq: Every day | ORAL | Status: DC
  Administered 2024-06-26 – 2024-07-03 (×8): 100 mg via ORAL
  Filled 2024-06-26 (×9): qty 1

## 2024-06-26 MED ORDER — DARBEPOETIN ALFA 100 MCG/0.5ML IJ SOSY
100.0000 ug | PREFILLED_SYRINGE | INTRAMUSCULAR | Status: DC
  Administered 2024-06-26: 100 ug via SUBCUTANEOUS
  Filled 2024-06-26: qty 0.5

## 2024-06-26 NOTE — Progress Notes (Signed)
 Speech Language Pathology Treatment: Cognitive-Linguistic  Patient Details Name: Cayley Pester MRN: [id number] DOB: 1981-03-26 Today's Date: 06/26/2024 Time: 8553-8488 SLP Time Calculation (min) (ACUTE ONLY): 25 min  Assessment / Plan / Recommendation Clinical Impression  Patient seen by SLP for skilled treatment focused on cognitive-linguistic goals. Mother was in the room. Patient had eyes closed and although SLP was able to rouse her, she only exhibited three separate instances of very brief alertness. She never opened her eyes quickly became non-responsive, snoring. SLP attempted sternal rub, cold wet washcloth to face, loud voice with no success. Mother inquired about patient being able to have some PO's like juice and stating her concern that patient needs more than just feeding tube feedings. SLP discussed issue of patient's poor alertness but that SLP will plan to attempt to work with patient again. Prognosis for improvement in cognition and prognosis for her returning to PO intake are both fair-poor.    HPI HPI: Patient is a 43 y.o. female who is well known to SLP department from prior hospitalizations. 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 PEG placed in July 2025 and converted to GJ tube in August. She has been hospitalized since 05/19/2024 for left gaze and left sided weakness. 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 Intubated 9/15-9/23. EVD 9/15- 10/15; s/p shunt 10/15. SLP had been ordered following extubation for evaluation of speech, language and cognitive function, however patient remained lethargic and unable to participate; SLP s/o 9/29 and recommended reorder SLE when patient was more consistently alert. PT and OT  evaluated patient on 10/18 and recommending intensive inpatient rehab >3 hours/day. OT requested SLE with SLP from MD.      SLP Plan  Continue with current plan of care          Recommendations                         Frequent or constant Supervision/Assistance Cognitive communication deficit (R41.841)     Continue with current plan of care     Norleen IVAR Blase, MA, CCC-SLP Speech Therapy

## 2024-06-26 NOTE — Progress Notes (Signed)
 Subjective: The patient's mother is at the bedside.  The patient is somnolent but the mother said she was up all night talking.  Her mother is disappointed she cannot go into inpatient rehab here.  We are awaiting placement.  Objective: Vital signs in last 24 hours: Temp:  [97.6 F (36.4 C)-98.6 F (37 C)] 98 F (36.7 C) (10/23 0829) Pulse Rate:  [82-95] 88 (10/23 0829) Resp:  [14-27] 14 (10/23 0829) BP: (100-187)/(51-85) 172/77 (10/23 0958) SpO2:  [95 %-100 %] 100 % (10/23 0335) Weight:  [51.4 kg-52.9 kg] 51.4 kg (10/22 1832) Estimated body mass index is 20.07 kg/m as calculated from the following:   Height as of this encounter: 5' 3 (1.6 m).   Weight as of this encounter: 51.4 kg.   Intake/Output from previous day: 10/22 0701 - 10/23 0700 In: -  Out: 1500  Intake/Output this shift: No intake/output data recorded.  Physical exam the patient is somnolent but arousable.  Her shunt incisions are healing well.  Lab Results: Recent Labs    06/24/24 0827 06/25/24 1412  WBC 4.9 4.0  HGB 9.5* 8.6*  HCT 28.4* 26.2*  PLT 135* 120*   BMET Recent Labs    06/25/24 1412  NA 127*  K 5.0  CL 88*  CO2 25  GLUCOSE 146*  BUN 86*  CREATININE 4.84*  CALCIUM  10.0    Studies/Results: No results found.  Assessment/Plan: Postop day #8 status post VP shunt: The patient is doing well.  We will plan to remove her staples tomorrow.  We are awaiting placement.  I have answered the patient's mother's questions.  LOS: 38 days     [name] [name] 06/26/2024, 10:40 AM     Patient ID: [name] [name], female   DOB: 1981-08-17, 43 y.o.   MRN: [id number]

## 2024-06-26 NOTE — Progress Notes (Signed)
 PROGRESS NOTE    [name] [name] Job  FMW:[id number] DOB: 1981-04-23 DOA: 05/19/2024 PCP: [facility]   Brief Narrative:  [name] with PMH HTN, ischemic and hemorrhagic CVA, type 1 diabetes, gastroparesis, kidney and pancreas transplant 2014, subsequent failure of renal transplant, now ESRD on dialysis, anxiety, narcotic abuse.  Presented to the [facility] ED from dialysis as a code stroke on 9/15; subsequently found to have hemorrhagic stroke.  Neurology, neurosurgery, PCCM consulted.  EVD was placed on the left side.  Patient admitted to neuro ICU.  She was intubated on admission.  She had prolonged ICU stay with EVD drain which was changed to VP shunt on 10/15.  Extubated on 9/23.  Transferred to [facility] service on 10/17.     She was recently discharged from here in August after she was managed for ischemic/hemorrhagic stroke and was discharged to LTAC.    Important events:  9/15 admitted with ICH plus IVH, EVD was placed for hydrocephalus, intubated 9/22 palliative care meeting.  Family is insistent that they want full scope of care 9/23 Extubated 10/1 -developed fevers pancultures drawn and broad-spectrum antibiotics started 10/2 became afebrile after starting antibiotics, did not tolerate raising EVD 10/6 EVD was raised from 10 to 30 cm water , tolerating well, remained afebrile 10/7 EVD is at 30 cm water , CT scan will be done tomorrow morning, no overnight issues 10/8 she became somnolent, CT head showed increasing hydrocephalus, EVD was titrated down to 10 cm of water  with improvement in mental status 10/15 VP shunt placement 10/17 transferred to progressive unit with [name]   Assessment & Plan:   Principal Problem:   Hemorrhagic stroke [facility]) Active Problems:   Moderate protein-calorie malnutrition   Pressure injury of skin   Nontraumatic subcortical hemorrhage of right cerebral hemisphere [facility])  Acute right parietotemporal intraparenchymal  hemorrhage/intraventricular hemorrhage Uncontrolled hypertension Obstructive hydrocephalus s/p EVD changed to VP shunt Seizure disorder s/p VP shunt placement 06/18/2024 by Dr. Mavis.  Remains stable on Vimpat  100 mg twice daily.  Neurosurgery following.  Per them, no issue after the shunt.   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  Patient on CellCept  and prednisone .  Dialyzed on MWF schedule.  Has AV fistula on the right upper extremity.    Acute bronchitis Resolved.    Intermittent low-grade fever: Thought to be secondary to intracranial hemorrhage.  Previous blood cultures showed contamination.  S/p antibiotics for respiratory cultures positive for Serratia and Klebsiella.  Repeat cultures negative thus far.  Patient afebrile for several days.   History of pancreatic transplantation: On cyclosporine , mycophenolate , prednisone    Hypertension:  Patient currently only on Coreg  and clonidine  on nondialysis days.  Patient's blood pressure remains elevated over 160 systolic on nondialysis days.  Will add amlodipine  5 mg on nondialysis days.     Anemia of chronic disease: No signs of active bleeding.  Hemoglobin stable   Type 1 diabetes with vasculopathy/gastroparesis: Not on long-term insulin  anymore due to pancreatic transplant.  Monitor blood sugars.  On sliding scale.   Moderate protein calorie malnutrition/dysphagia: Has PEG tube.  Continue tube feeds.  SLP and dietitian following.  Patient has dry cough.  Lungs clear to auscultation.  Likely due to dysphagia.   Stage I sacral pressure ulcer present on admission: Continue wound care   Debility/deconditioning/disposition/goals of care: Young patient with multiple comorbidities.  Palliative care consulted for goals of care conversations, family insist she remains full code.  Palliative has signed  off 10/17.  Seen by PT OT.  They recommended CIR.  Reviewed by CIR, they  recommend LTAC.  I was asked to do peer to peer with insurance company today 06/25/2024, I completed that, they denied LTAC.  They recommended LTC.  I have informed TOC and LTAC representative.  LTAC wants to appeal, I have signed the papers.  Diarrhea: Improved with Imodium .  DVT prophylaxis: SCDs Start: 05/19/24 1724   Code Status: Full Code  Family Communication: Mother none present at bedside.   Status is: Inpatient Remains inpatient appropriate because: Medically stable, pending placement, difficult to place.   Estimated body mass index is 20.07 kg/m as calculated from the following:   Height as of this encounter: 5' 3 (1.6 m).   Weight as of this encounter: 51.4 kg.  Wound 05/19/24 1933 Pressure Injury Buttocks Bilateral Stage 1 -  Intact skin with non-blanchable redness of a localized area usually over a bony prominence. (Active)   Nutritional Assessment: Body mass index is 20.07 kg/m.[name] Seen by dietician.  I agree with the assessment and plan as outlined below: Nutrition Status: Nutrition Problem: Moderate Malnutrition Etiology: chronic illness (ESRD on HD/DM and gastroparesis) Signs/Symptoms: severe muscle depletion, mild fat depletion Interventions: Refer to RD note for recommendations  . Skin Assessment: I have examined the patient's skin and I agree with the wound assessment as performed by the wound care RN as outlined below: Wound 05/19/24 1933 Pressure Injury Buttocks Bilateral Stage 1 -  Intact skin with non-blanchable redness of a localized area usually over a bony prominence. (Active)    Consultants:  General surgery, palliative care, nephrology, neurology  Procedures:  As above  Antimicrobials:  Anti-infectives (From admission, onward)    Start     Dose/Rate Route Frequency Ordered Stop   06/18/24 2030  ceFAZolin  (ANCEF ) IVPB 2g/100 mL premix  Status:  Discontinued        2 g 200 mL/hr over 30 Minutes Intravenous Every 8 hours 06/18/24 1454 06/19/24  1608   06/18/24 0600  ceFAZolin  (ANCEF ) IVPB 2g/100 mL premix        2 g 200 mL/hr over 30 Minutes Intravenous On call to O.R. 06/15/24 1152 06/18/24 1230   06/06/24 1400  piperacillin -tazobactam (ZOSYN ) IVPB 2.25 g        2.25 g 100 mL/hr over 30 Minutes Intravenous Every 8 hours 06/06/24 1017 06/09/24 0538   06/06/24 1200  vancomycin  (VANCOREADY) IVPB 500 mg/100 mL        500 mg 100 mL/hr over 60 Minutes Intravenous Every M-W-F (Hemodialysis) 06/04/24 1745 06/06/24 1900   06/04/24 1845  vancomycin  (VANCOCIN ) IVPB 1000 mg/200 mL premix        1,000 mg 200 mL/hr over 60 Minutes Intravenous  Once 06/04/24 1745 06/04/24 1915   06/04/24 1830  ceFEPIme  (MAXIPIME ) 2 g in sodium chloride  0.9 % 100 mL IVPB  Status:  Discontinued        2 g 200 mL/hr over 30 Minutes Intravenous Every M-W-F (Hemodialysis) 06/04/24 1743 06/06/24 1017   05/27/24 1015  cefTRIAXone  (ROCEPHIN ) 2 g in sodium chloride  0.9 % 100 mL IVPB        2 g 200 mL/hr over 30 Minutes Intravenous Every 24 hours 05/27/24 0918 05/29/24 1014   05/23/24 1530  ceFEPIme  (MAXIPIME ) 1 g in sodium chloride  0.9 % 100 mL IVPB  Status:  Discontinued        1 g 200 mL/hr over 30 Minutes Intravenous Every 24 hours 05/23/24 1433 05/27/24 9081  05/22/24 2200  Ampicillin -Sulbactam (UNASYN ) 3 g in sodium chloride  0.9 % 100 mL IVPB  Status:  Discontinued        3 g 200 mL/hr over 30 Minutes Intravenous Every 24 hours 05/22/24 0834 05/23/24 1433   05/22/24 0930  Ampicillin -Sulbactam (UNASYN ) 3 g in sodium chloride  0.9 % 100 mL IVPB        3 g 200 mL/hr over 30 Minutes Intravenous  Once 05/22/24 0834 05/22/24 1900         Subjective: Patient seen and examined.  She is lethargic today.  Mother and nurse at the bedside during my visit.  No concerns raised.  Objective: Vitals:   06/25/24 1832 06/25/24 2021 06/26/24 0050 06/26/24 0335  BP:  (!) 150/72 (!) 148/76 (!) 169/66  Pulse:  82 87 88  Resp:  18 18 18   Temp:  98.6 F (37 C) 98.4 F  (36.9 C) 98.2 F (36.8 C)  TempSrc:  Oral Oral Oral  SpO2:  100% 97% 100%  Weight: 51.4 kg     Height:        Intake/Output Summary (Last 24 hours) at 06/26/2024 0820 Last data filed at 06/25/2024 1830 Gross per 24 hour  Intake --  Output 1500 ml  Net -1500 ml   Filed Weights   06/24/24 0443 06/25/24 1458 06/25/24 1832  Weight: 52.8 kg 52.9 kg 51.4 kg    Examination: General exam: Appears calm and comfortable  Respiratory system: Clear to auscultation. Respiratory effort normal. Cardiovascular system: S1 & S2 heard, RRR. No JVD, murmurs, rubs, gallops or clicks. No pedal edema. Gastrointestinal system: Abdomen is nondistended, soft and nontender. No organomegaly or masses felt. Normal bowel sounds heard. Central nervous system: Alert and oriented x 2.  4/5 power in bilateral lower extremity.    Data Reviewed: I have personally reviewed following labs and imaging studies  CBC: Recent Labs  Lab 06/21/24 0505 06/22/24 0644 06/23/24 0600 06/24/24 0827 06/25/24 1412  WBC 5.3 5.1 4.1 4.9 4.0  NEUTROABS  --   --   --  3.4  --   HGB 9.6* 9.1* 8.9* 9.5* 8.6*  HCT 29.7* 28.1* 27.9* 28.4* 26.2*  MCV 98.0 97.2 97.6 94.0 94.2  PLT 167 137* 124* 135* 120*   Basic Metabolic Panel: Recent Labs  Lab 06/20/24 0526 06/21/24 0505 06/22/24 0644 06/23/24 0600 06/25/24 1412  NA 135 133* 135 132* 127*  K 3.7 3.6 3.4* 4.0 5.0  CL 90* 90* 88* 88* 88*  CO2 26 26 29 26 25   GLUCOSE 135* 140* 141* 146* 146*  BUN 44* 30* 66* 97* 86*  CREATININE 3.24* 2.24* 4.10* 5.36* 4.84*  CALCIUM  11.0* 10.7* 11.1* 10.9* 10.0  PHOS 4.8* 3.6 4.5 4.6 4.6   GFR: Estimated Creatinine Clearance: 12.3 mL/min (A) (by C-G formula based on SCr of 4.84 mg/dL (H)). Liver Function Tests: Recent Labs  Lab 06/25/24 1412  ALBUMIN  3.4*   No results for input(s): LIPASE, AMYLASE in the last 168 hours. No results for input(s): AMMONIA in the last 168 hours. Coagulation Profile: No results for  input(s): INR, PROTIME in the last 168 hours.  Cardiac Enzymes: No results for input(s): CKTOTAL, CKMB, CKMBINDEX, TROPONINI in the last 168 hours. BNP (last 3 results) No results for input(s): PROBNP in the last 8760 hours. HbA1C: No results for input(s): HGBA1C in the last 72 hours. CBG: Recent Labs  Lab 06/25/24 0741 06/25/24 1120 06/25/24 2023 06/26/24 0051 06/26/24 0335  GLUCAP 152* 157* 108* 140* 143*  Lipid Profile: No results for input(s): CHOL, HDL, LDLCALC, TRIG, CHOLHDL, LDLDIRECT in the last 72 hours. Thyroid Function Tests: No results for input(s): TSH, T4TOTAL, FREET4, T3FREE, THYROIDAB in the last 72 hours. Anemia Panel: No results for input(s): VITAMINB12, FOLATE, FERRITIN, TIBC, IRON , RETICCTPCT in the last 72 hours. Sepsis Labs: No results for input(s): PROCALCITON, LATICACIDVEN in the last 168 hours.  No results found for this or any previous visit (from the past 240 hours).   Radiology Studies: No results found.   Scheduled Meds:  amLODipine   5 mg Oral Daily   carvedilol   25 mg Per Tube 2 times per day on Sunday Tuesday Thursday Saturday   Chlorhexidine  Gluconate Cloth  6 each Topical Q0600   cinacalcet   30 mg Oral Q M,W,F   cloNIDine   0.3 mg Per Tube Once per day on Sunday Tuesday Thursday Saturday   cycloSPORINE   200 mg Per Tube QPM   cycloSPORINE   225 mg Per Tube Daily   darbepoetin (ARANESP ) injection - DIALYSIS  100 mcg Subcutaneous Q Wed-1800   fiber  1 packet Per Tube TID   insulin  aspart  0-9 Units Subcutaneous Q4H   lacosamide   100 mg Per Tube BID   multivitamin with minerals  1 tablet Per Tube Daily   mycophenolate   500 mg Per Tube BID   mouth rinse  15 mL Mouth Rinse 4 times per day   oxidized cellulose  1 each Topical Once   pantoprazole  (PROTONIX ) IV  40 mg Intravenous QHS   predniSONE   15 mg Per Tube Q breakfast   Continuous Infusions:  feeding supplement (NEPRO CARB STEADY)  1,000 mL (06/25/24 2123)     LOS: 38 days   Fredia Skeeter, MD [name]  06/26/2024, 8:20 AM   *Please note that this is a verbal dictation therefore any spelling or grammatical errors are due to the Dragon Medical One system interpretation.  Please page via Amion and do not message via secure chat for urgent patient care matters. Secure chat can be used for non urgent patient care matters.  How to contact the [facility] Attending or Consulting provider 7A - 7P or covering provider during after hours 7P -7A, for this patient?  Check the care team in [facility] and look for a) attending/consulting [facility] provider listed and b) the [facility] team listed. Page or secure chat 7A-7P. Log into [email or web address] and use [facility]'s universal password to access. If you do not have the password, please contact the hospital operator. Locate the [facility] provider you are looking for under [name] and page to a number that you can be directly reached. If you still have difficulty reaching the provider, please page the [facility] (Director on Call) for the Hospitalists listed on amion for assistance.

## 2024-06-26 NOTE — Plan of Care (Signed)
  Problem: Education: Goal: Ability to describe self-care measures that may prevent or decrease complications (Diabetes Survival Skills Education) will improve Outcome: Progressing Goal: Individualized Educational Video(s) Outcome: Progressing   Problem: Coping: Goal: Ability to adjust to condition or change in health will improve Outcome: Progressing   Problem: Fluid Volume: Goal: Ability to maintain a balanced intake and output will improve Outcome: Progressing   Problem: Health Behavior/Discharge Planning: Goal: Ability to identify and utilize available resources and services will improve Outcome: Progressing Goal: Ability to manage health-related needs will improve Outcome: Progressing   Problem: Metabolic: Goal: Ability to maintain appropriate glucose levels will improve Outcome: Progressing   Problem: Nutritional: Goal: Maintenance of adequate nutrition will improve Outcome: Progressing Goal: Progress toward achieving an optimal weight will improve Outcome: Progressing   Problem: Skin Integrity: Goal: Risk for impaired skin integrity will decrease Outcome: Progressing   Problem: Tissue Perfusion: Goal: Adequacy of tissue perfusion will improve Outcome: Progressing   Problem: Education: Goal: Knowledge of disease or condition will improve Outcome: Progressing Goal: Knowledge of secondary prevention will improve (MUST DOCUMENT ALL) Outcome: Progressing Goal: Knowledge of patient specific risk factors will improve (DELETE if not current risk factor) Outcome: Progressing   Problem: Intracerebral Hemorrhage Tissue Perfusion: Goal: Complications of Intracerebral Hemorrhage will be minimized Outcome: Progressing   Problem: Coping: Goal: Will verbalize positive feelings about self Outcome: Progressing Goal: Will identify appropriate support needs Outcome: Progressing   Problem: Health Behavior/Discharge Planning: Goal: Ability to manage health-related needs will  improve Outcome: Progressing Goal: Goals will be collaboratively established with patient/family Outcome: Progressing   Problem: Self-Care: Goal: Ability to participate in self-care as condition permits will improve Outcome: Progressing Goal: Verbalization of feelings and concerns over difficulty with self-care will improve Outcome: Progressing Goal: Ability to communicate needs accurately will improve Outcome: Progressing   Problem: Nutrition: Goal: Risk of aspiration will decrease Outcome: Progressing Goal: Dietary intake will improve Outcome: Progressing   Problem: Education: Goal: Knowledge of General Education information will improve Description: Including pain rating scale, medication(s)/side effects and non-pharmacologic comfort measures Outcome: Progressing   Problem: Health Behavior/Discharge Planning: Goal: Ability to manage health-related needs will improve Outcome: Progressing   Problem: Clinical Measurements: Goal: Ability to maintain clinical measurements within normal limits will improve Outcome: Progressing Goal: Will remain free from infection Outcome: Progressing Goal: Diagnostic test results will improve Outcome: Progressing Goal: Respiratory complications will improve Outcome: Progressing Goal: Cardiovascular complication will be avoided Outcome: Progressing   Problem: Activity: Goal: Risk for activity intolerance will decrease Outcome: Progressing   Problem: Nutrition: Goal: Adequate nutrition will be maintained Outcome: Progressing   Problem: Coping: Goal: Level of anxiety will decrease Outcome: Progressing   Problem: Elimination: Goal: Will not experience complications related to bowel motility Outcome: Progressing   Problem: Pain Managment: Goal: General experience of comfort will improve and/or be controlled Outcome: Progressing   Problem: Safety: Goal: Ability to remain free from injury will improve Outcome: Progressing    Problem: Skin Integrity: Goal: Risk for impaired skin integrity will decrease Outcome: Progressing   Problem: Education: Goal: Knowledge of the prescribed therapeutic regimen will improve Outcome: Progressing   Problem: Clinical Measurements: Goal: Usual level of consciousness will be regained or maintained. Outcome: Progressing Goal: Neurologic status will improve Outcome: Progressing Goal: Ability to maintain intracranial pressure will improve Outcome: Progressing   Problem: Skin Integrity: Goal: Demonstration of wound healing without infection will improve Outcome: Progressing

## 2024-06-26 NOTE — Progress Notes (Signed)
 Patient ID: [name] [name], female   DOB: 11-Jul-1981, 43 y.o.   MRN: [id number] [facility] Progress Note   Assessment/ Plan:   1.  Acute right intracranial hemorrhage: Associated with uncontrolled hypertension and status post conversion from EVD to VP shunt on 06/18/2024 by neurosurgery.  Mental status appears to be improving but she continues to require total assistance for PT/OT.  Recommendations made for LTAC placement after failing to meet criteria for CIR admission.  I will start her on low-dose gabapentin  100 mg daily to help with her itching/fidgeting. 2. ESRD following failure of renal allograft: Continue hemodialysis on Monday/Wednesday/Friday with next dialysis ordered for tomorrow via right brachiocephalic fistula.  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  3. Anemia: Hemoglobin/hematocrit continue to remain stable without any overt blood loss.  She remains on Aranesp  100 mcg weekly. 4. CKD-MBD: With marginally elevated calcium  that is suspected to be largely from immobility.  Status post pamidronate on 06/15/2024 and started on Cinacalcet  30 mg 3 days a week. 5. Nutrition: Currently on tube feeds with Nepro along with renal MVI. 6. Hypertension: Intermittently elevated blood pressures noted on the current regimen, will discontinue midodrine .  Subjective:   Slept poorly overnight with continued fidgeting and picking at scars/dressings   Objective:   BP (!) 173/84 (BP Location: Left Arm)   Pulse 88   Temp 98 F (36.7 C) (Oral)   Resp 14   Ht 5' 3 (1.6 m)   Wt 51.4 kg   SpO2 100%   BMI 20.07 kg/m   Physical Exam: Gen: Sleeping comfortably.  Mother at bedside CVS: Pulse regular rhythm, normal rate, S1 and S2 normal Resp: Anteriorly clear to auscultation, no rales/rhonchi Abd: Soft, flat, nontender, bowel sounds normal Ext: No  lower extremity edema.  Right upper arm AV fistula with palpable thrill/intact dressings Labs: BMET Recent Labs  Lab 06/20/24 0526 06/21/24 0505 06/22/24 0644 06/23/24 0600 06/25/24 1412  NA 135 133* 135 132* 127*  K 3.7 3.6 3.4* 4.0 5.0  CL 90* 90* 88* 88* 88*  CO2 26 26 29 26 25   GLUCOSE 135* 140* 141* 146* 146*  BUN 44* 30* 66* 97* 86*  CREATININE 3.24* 2.24* 4.10* 5.36* 4.84*  CALCIUM  11.0* 10.7* 11.1* 10.9* 10.0  PHOS 4.8* 3.6 4.5 4.6 4.6   CBC Recent Labs  Lab 06/22/24 0644 06/23/24 0600 06/24/24 0827 06/25/24 1412  WBC 5.1 4.1 4.9 4.0  NEUTROABS  --   --  3.4  --   HGB 9.1* 8.9* 9.5* 8.6*  HCT 28.1* 27.9* 28.4* 26.2*  MCV 97.2 97.6 94.0 94.2  PLT 137* 124* 135* 120*      Medications:     amLODipine   5 mg Oral Once per day on Sunday Tuesday Thursday Saturday   carvedilol   25 mg Per Tube 2 times per day on Sunday Tuesday Thursday Saturday   Chlorhexidine  Gluconate Cloth  6 each Topical Q0600   cinacalcet   30 mg Oral Q M,W,F   cloNIDine   0.3 mg Per Tube Once per day on Sunday Tuesday Thursday Saturday   cycloSPORINE   200 mg Per Tube QPM   cycloSPORINE   225 mg Per Tube Daily   darbepoetin (ARANESP ) injection - DIALYSIS  100 mcg Subcutaneous Q Wed-1800   fiber  1 packet Per Tube TID   gabapentin   100 mg Oral Daily   insulin  aspart  0-9 Units Subcutaneous Q4H  lacosamide   100 mg Per Tube BID   multivitamin with minerals  1 tablet Per Tube Daily   mycophenolate   500 mg Per Tube BID   mouth rinse  15 mL Mouth Rinse 4 times per day   oxidized cellulose  1 each Topical Once   pantoprazole  (PROTONIX ) IV  40 mg Intravenous QHS   predniSONE   15 mg Per Tube Q breakfast   Gordy Blanch, MD 06/26/2024, 8:44 AM

## 2024-06-26 NOTE — Progress Notes (Addendum)
 Nutrition Follow-up  DOCUMENTATION CODES:   Underweight, Non-severe (moderate) malnutrition in context of chronic illness  INTERVENTION:  Continue Tube feeding via J port of G-J tube: Nepro at 55 ml/h (1320 ml per day)  Provides 1980 kcal, 106 gm protein, 972 ml free water  daily    Nutrisource fiber TID   Medications likely contributing to diarrhea but has a J-tube so limited options for medication administration.  NUTRITION DIAGNOSIS:   Moderate Malnutrition related to chronic illness (ESRD on HD/DM and gastroparesis) as evidenced by severe muscle depletion, mild fat depletion. - Ongoing   GOAL:   Patient will meet greater than or equal to 90% of their needs - Meeting via TF  MONITOR:   Diet advancement, Vent status, Labs, I & O's, TF tolerance  REASON FOR ASSESSMENT:   Consult Enteral/tube feeding initiation and management  ASSESSMENT:   43 year old female who presented as a Code Stroke with IVH. 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 Recent admissions 6/11-7/28 and 7/29-8/13 for ICH with GJ tube.   6/11 - 7/29: Hospital admission, discharge to [facility] 8/12 - G tube converted to GJ  9/16 - Admitted, EVD placed, intubated 9/21 - switched to Nepro formula; FMS placed 9/22 - Extubated  9/23 - adjusted rate of Nepro to meet needs (was previously exceeding) 9/25 - D/C Nepro, Started Osmolite 1.5 10/3 - Renal changed TF to Nepro due to higher K (likely due to no HD over weekend) 10/15 s/p conversion from EVD to VP shunt  10/16 - Transferred to progressive   Pt sleeping on visit, mother at bedside. Pt has not been sleeping well, on visit was not fidgeting like she usually does. Pt tolerating tube feeds at goal, still continues to have multiple loose BM which has been an ongoing issue even since before  admission. On fiber TID. Per RN stil having multiple loose BM per day. Limited options available for medication administration as pt has J tube.   Mother wanted to know when pt can eat something. RD educated pt on gastroparesis and pt's history of vomiting during this admission and previous admission. Likely will need to be on tube feeds long term. Continues to require total assistance for PT/OT.  Repeat exam showed increasing muscle deficits related to being bed bound, fat stores still seem adequate. Below EDW however, appears weight stable around 110-120 lbs.   Disposition: LTAC since denied CIR   Intake/Output Summary (Last 24 hours) at 06/26/2024 1311 Last data filed at 06/25/2024 1830 Gross per 24 hour  Intake --  Output 1500 ml  Net -1500 ml  No UOP  Admit weight: 50.4 kg Current weight: 51.4 kg EDW: 55.2 kg  Average Meal Intake: NPO  Nutritionally Relevant Medications: Scheduled Meds:  fiber  1 packet Per Tube TID   insulin  aspart  0-9 Units Subcutaneous Q4H   lacosamide   100 mg Per Tube BID   multivitamin with minerals  1 tablet Per Tube Daily   Continuous Infusions:  feeding supplement (NEPRO CARB STEADY) 1,000 mL (06/25/24 2123)   Labs Reviewed: Sodium 127 BUN 86 Creatinine 4.84 Magnesium  3.1 AP 134 GFR 11 CBG ranges from 108-157 mg/dL over the last 24 hours HgbA1c 3.6  NUTRITION - FOCUSED PHYSICAL EXAM:  Flowsheet Row Most Recent Value  Orbital Region Severe depletion  Upper Arm Region Severe depletion  Thoracic and Lumbar Region Moderate depletion  Buccal Region Severe depletion  Temple Region Severe depletion  Clavicle Bone Region Severe depletion  Clavicle and Acromion Bone Region Severe depletion  Scapular Bone Region Severe depletion  Dorsal Hand Moderate depletion  Patellar Region Severe depletion  Anterior Thigh Region Severe depletion  Posterior Calf Region Severe depletion  Edema (RD Assessment) Mild  Hair Reviewed  Eyes Unable to  assess  Mouth Unable to assess  Skin Reviewed  [Closed surgical incision on head]  Nails Reviewed    Diet Order:   Diet Order     None       EDUCATION NEEDS:   Not appropriate for education at this time  Skin:  Skin Assessment: Skin Integrity Issues: Skin Integrity Issues:: Stage I Stage I: buttocks  Last BM:  0 via FMS per RN output in tube  Height:   Ht Readings from Last 1 Encounters:  06/18/24 5' 3 (1.6 m)    Weight:   Wt Readings from Last 1 Encounters:  06/25/24 51.4 kg    Ideal Body Weight:  52.3 kg  BMI:  Body mass index is 20.07 kg/m.  Estimated Nutritional Needs:   Kcal:  1700-1900  Protein:  80-100 grams  Fluid:  1L +UOP   Olivia Kenning, RD Registered Dietitian  See Amion for more information

## 2024-06-26 NOTE — Progress Notes (Signed)
 Requested to change amlidipine 5mg  on nonHD days (TTSS) per Dr. Vernon.  Sergio Batch, PharmD, BCIDP, AAHIVP, CPP Infectious Disease Pharmacist 06/26/2024 8:26 AM

## 2024-06-26 NOTE — Progress Notes (Signed)
 Physical Therapy Treatment Patient Details Name: [name] [name] MRN: [id number] DOB: [birth year] Today's Date: 06/26/2024   History of Present Illness 43 yo female presents 05/19/24 for left gaze and L side weakness. 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 Intubated 9/15-9/23. EVD 9/15- 10/15; s/p shunt 10/15. S/p laparoscopy with intra-abdominal assistance ventricular-peritoneal shunt placement 10/15. PMH: HTN, ESRD on HD s/p renal and pancreatic transplants, DM, gastroparesis, multiple strokes (ischemic and hemorrhagic) with prepontine SAH, hydrocephalus requiring EVD placement    PT Comments  Pt received in supine and agreeable to PT session. Pt was lethargic during the session and would occasionally respond with yes or no to questions. Pt was unaware of having a BM with TotalA needed for pericare. Able to perform bed mobility with MaxA with increased assist likely due to decreased alertness and poor attention to task at hand. Performed stand-pivot to the right with MaxAx2 with pt able to slightly move R LE towards recliner. Once seated in the recliner, pt started sliding down due to pushing with B LE on leg rest. Performed stand-pivot back to the bed due to safety concerns with not having family in the room. Noted pt continued to have a BM with nurse tech notified. Noted plan for LTACH with acute PT to continue to follow.     If plan is discharge home, recommend the following: A lot of help with walking and/or transfers;A lot of help with bathing/dressing/bathroom;Assistance with cooking/housework;Direct supervision/assist for medications management;Direct supervision/assist for financial management;Assist for transportation;Help with stairs or ramp for entrance   Can travel by private vehicle      No  Equipment Recommendations  Hospital bed;Hoyer lift;Wheelchair  (measurements PT);Wheelchair cushion (measurements PT);BSC/3in1       Precautions / Restrictions Precautions Precautions: Fall Recall of Precautions/Restrictions: Impaired Precaution/Restrictions Comments: LUQ PEG Restrictions Weight Bearing Restrictions Per Provider Order: No     Mobility  Bed Mobility Overal bed mobility: Needs Assistance Bed Mobility: Rolling, Sidelying to Sit, Sit to Supine Rolling: Max assist Sidelying to sit: Max assist   Sit to supine: Max assist   General bed mobility comments: limited initiation with dense cues    Transfers Overall transfer level: Needs assistance Equipment used: 2 person hand held assist Transfers: Sit to/from Stand, Bed to chair/wheelchair/BSC Sit to Stand: Max assist, Via lift equipment, +2 physical assistance, +2 safety/equipment Stand pivot transfers: Max assist, +2 physical assistance, +2 safety/equipment    General transfer comment: performed stand-pivot with pt slightly able to move R LE towards recliner    Balance Overall balance assessment: Needs assistance Sitting-balance support: Feet supported Sitting balance-Leahy Scale: Poor Sitting balance - Comments: ModA Postural control: Right lateral lean, Posterior lean Standing balance support: Bilateral upper extremity supported Standing balance-Leahy Scale: Zero Standing balance comment: +2 A       Communication Communication Communication: No apparent difficulties  Cognition Arousal: Lethargic Behavior During Therapy: Flat affect   PT - Cognitive impairments: Awareness, Memory, Attention, Initiation, Sequencing, Safety/Judgement, Problem solving    PT - Cognition Comments: Lethargic with stimulation needed to maintain alertness. Restless and distracted by itching and attempting to pull at leads Following commands: Impaired Following commands impaired: Follows one step commands inconsistently    Cueing Cueing Techniques: Verbal cues, Gestural cues, Tactile cues          Pertinent Vitals/Pain Pain Assessment Pain Assessment: No/denies pain (Simultaneous filing. User may not have seen previous  data.) Pain Intervention(s): Monitored during session     PT Goals (current goals can now be found in the care plan section) Acute Rehab PT Goals PT Goal Formulation: Patient unable to participate in goal setting Time For Goal Achievement: 07/05/24 Potential to Achieve Goals: Fair Progress towards PT goals: Progressing toward goals    Frequency    Min 2X/week       AM-PAC PT 6 Clicks Mobility   Outcome Measure  Help needed turning from your back to your side while in a flat bed without using bedrails?: A Lot Help needed moving from lying on your back to sitting on the side of a flat bed without using bedrails?: A Lot Help needed moving to and from a bed to a chair (including a wheelchair)?: Total Help needed standing up from a chair using your arms (e.g., wheelchair or bedside chair)?: Total Help needed to walk in hospital room?: Total Help needed climbing 3-5 steps with a railing? : Total 6 Click Score: 8    End of Session Equipment Utilized During Treatment: Gait belt Activity Tolerance: Patient limited by lethargy;Patient limited by fatigue Patient left: in bed;with call bell/phone within reach;with bed alarm set Nurse Communication: Mobility status PT Visit Diagnosis: Unsteadiness on feet (R26.81);Other abnormalities of gait and mobility (R26.89);Muscle weakness (generalized) (M62.81);Difficulty in walking, not elsewhere classified (R26.2);Other symptoms and signs involving the nervous system (R29.898)     Time: 1335-1400 PT Time Calculation (min) (ACUTE ONLY): 25 min  Charges:    $Therapeutic Activity: 23-37 mins PT General Charges $$ ACUTE PT VISIT: 1 Visit                    Kate ORN, PT, DPT Secure Chat Preferred  Rehab Office [phone]    Kate BRAVO Wendolyn 06/26/2024, 3:28 PM

## 2024-06-27 DIAGNOSIS — I619 Nontraumatic intracerebral hemorrhage, unspecified: Secondary | ICD-10-CM | POA: Diagnosis not present

## 2024-06-27 LAB — CBC
HCT: 24.3 % — ABNORMAL LOW (ref 36.0–46.0)
Hemoglobin: 7.8 g/dL — ABNORMAL LOW (ref 12.0–15.0)
MCH: 31.5 pg (ref 26.0–34.0)
MCHC: 32.1 g/dL (ref 30.0–36.0)
MCV: 98 fL (ref 80.0–100.0)
Platelets: 115 K/uL — ABNORMAL LOW (ref 150–400)
RBC: 2.48 MIL/uL — ABNORMAL LOW (ref 3.87–5.11)
RDW: 14.5 % (ref 11.5–15.5)
WBC: 3.5 K/uL — ABNORMAL LOW (ref 4.0–10.5)
nRBC: 0 % (ref 0.0–0.2)

## 2024-06-27 LAB — RENAL FUNCTION PANEL
Albumin: 3.3 g/dL — ABNORMAL LOW (ref 3.5–5.0)
Anion gap: 17 — ABNORMAL HIGH (ref 5–15)
BUN: 73 mg/dL — ABNORMAL HIGH (ref 6–20)
CO2: 25 mmol/L (ref 22–32)
Calcium: 10.3 mg/dL (ref 8.9–10.3)
Chloride: 87 mmol/L — ABNORMAL LOW (ref 98–111)
Creatinine, Ser: 4.28 mg/dL — ABNORMAL HIGH (ref 0.44–1.00)
GFR, Estimated: 13 mL/min — ABNORMAL LOW (ref >60)
Glucose, Bld: 145 mg/dL — ABNORMAL HIGH (ref 70–99)
Phosphorus: 5 mg/dL — ABNORMAL HIGH (ref 2.5–4.6)
Potassium: 4.1 mmol/L (ref 3.5–5.1)
Sodium: 129 mmol/L — ABNORMAL LOW (ref 135–145)

## 2024-06-27 LAB — GLUCOSE, CAPILLARY
Glucose-Capillary: 125 mg/dL — ABNORMAL HIGH (ref 70–99)
Glucose-Capillary: 134 mg/dL — ABNORMAL HIGH (ref 70–99)
Glucose-Capillary: 149 mg/dL — ABNORMAL HIGH (ref 70–99)
Glucose-Capillary: 98 mg/dL (ref 70–99)
Glucose-Capillary: 135 mg/dL — ABNORMAL HIGH (ref 70–99)
Glucose-Capillary: 155 mg/dL — ABNORMAL HIGH (ref 70–99)

## 2024-06-27 NOTE — Progress Notes (Signed)
   06/27/24 1208  Vitals  Temp (!) 97.5 F (36.4 C)  Pulse Rate 87  Resp 16  BP (!) 102/43  SpO2 100 %  O2 Device Room Air  Weight 51.6 kg  Type of Weight Post-Dialysis  Oxygen  Therapy  Patient Activity (if Appropriate) In bed  Pulse Oximetry Type Continuous  Oximetry Probe Site Changed No  Post Treatment  Dialyzer Clearance Lightly streaked  Liters Processed 78  Fluid Removed (mL) 1800 mL  Tolerated HD Treatment Yes  AVG/AVF Arterial Site Held (minutes) 5 minutes  AVG/AVF Venous Site Held (minutes) 5 minutes   Received patient in bed to unit.  Alert -oriented to person Informed consent signed and in chart.   TX duration:3.15  Patient tolerated well.  Transported back to the room  Alert, without acute distress.  Hand-off given to patient's nurse.   Access used: RUAF Access issues: none  Total UF removed: 1800 Medication(s) given: none   [name]

## 2024-06-27 NOTE — Progress Notes (Signed)
 PROGRESS NOTE    [name] [name]  FMW:[id number] DOB: [birth year] DOA: 05/19/2024 [facility]: [facility]   Brief Narrative:  [name] with PMH HTN, ischemic and hemorrhagic CVA, type 1 diabetes, gastroparesis, kidney and pancreas transplant 2014, subsequent failure of renal transplant, now ESRD on dialysis, anxiety, narcotic abuse.  Presented to the [facility] ED from dialysis as a code stroke on 9/15; subsequently found to have hemorrhagic stroke.  Neurology, neurosurgery, PCCM consulted.  EVD was placed on the left side.  Patient admitted to neuro ICU.  She was intubated on admission.  She had prolonged ICU stay with EVD drain which was changed to VP shunt on 10/15.  Extubated on 9/23.  Transferred to [facility] service on 10/17.     She was recently discharged from here in August after she was managed for ischemic/hemorrhagic stroke and was discharged to LTAC.    Important events:  9/15 admitted with ICH plus IVH, EVD was placed for hydrocephalus, intubated 9/22 palliative care meeting.  Family is insistent that they want full scope of care 9/23 Extubated 10/1 -developed fevers pancultures drawn and broad-spectrum antibiotics started 10/2 became afebrile after starting antibiotics, did not tolerate raising EVD 10/6 EVD was raised from 10 to 30 cm water , tolerating well, remained afebrile 10/7 EVD is at 30 cm water , CT scan will be done tomorrow morning, no overnight issues 10/8 she became somnolent, CT head showed increasing hydrocephalus, EVD was titrated down to 10 cm of water  with improvement in mental status 10/15 VP shunt placement 10/17 transferred to progressive unit with [name]   Assessment & Plan:   Principal Problem:   Hemorrhagic stroke [facility]) Active Problems:   Moderate protein-calorie malnutrition   Pressure injury of skin   Nontraumatic subcortical hemorrhage of right cerebral hemisphere [facility])  Acute right parietotemporal intraparenchymal  hemorrhage/intraventricular hemorrhage Uncontrolled hypertension Obstructive hydrocephalus s/p EVD changed to VP shunt Seizure disorder s/p VP shunt placement 06/18/2024 by Dr. Mavis. Remains stable on Vimpat  100 mg twice daily. Neurosurgery following. Per them, no issue after the shunt.   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  Patient on CellCept  and prednisone . Dialyzed on MWF schedule. Has AV fistula on the right upper extremity. Completed HD today with 1.8 L out with no issues.   Acute bronchitis Resolved.    Intermittent low-grade fever: Thought to be secondary to intracranial hemorrhage.  Previous blood cultures showed contamination.  S/p antibiotics for respiratory cultures positive for Serratia and Klebsiella. Repeat cultures negative thus far.  Patient afebrile for several days.   History of pancreatic transplantation: On cyclosporine , mycophenolate , prednisone    Hypertension:  Patient currently only on Coreg  and clonidine  on nondialysis days.  Patient's blood pressure remains elevated over 160 systolic on nondialysis days. Amlodipine  5 mg added on nondialysis days. SBP improved to the 110-140s.   Anemia of chronic disease: No signs of active bleeding. Hemoglobin stable   Type 1 diabetes with vasculopathy/gastroparesis: Not on long-term insulin  anymore due to pancreatic transplant. Monitor blood sugars. On sliding scale.   Moderate protein calorie malnutrition/dysphagia: Has PEG tube. Continue tube feeds.  SLP and dietitian following. Patient has dry cough.  Lungs clear to auscultation.  Likely due to dysphagia.   Stage I sacral pressure ulcer present on admission: Continue wound care   Debility/deconditioning/disposition/goals of care: Young patient with multiple comorbidities.  Palliative care consulted for goals of care conversations, family insist she remains full code.  Palliative has signed off  10/17.   Seen by PT OT.  They recommended CIR.  Reviewed by CIR, they recommend LTAC.  Peer to peer with insurance company was done by previous provider on 06/25/2024, and they denied LTAC.  They recommended LTC. I have informed TOC and LTAC representative.  LTAC wants to appeal, if appeal papers have been signed.  Diarrhea: Improved with Imodium .  DVT prophylaxis: SCDs Start: 05/19/24 1724   Code Status: Full Code  Family Communication: Mother none present at bedside.   Status is: Inpatient Remains inpatient appropriate because: Medically stable, pending placement, difficult to place.   Estimated body mass index is 20.85 kg/m as calculated from the following:   Height as of this encounter: 5' 3 (1.6 m).   Weight as of this encounter: 53.4 kg.  Wound 05/19/24 1933 Pressure Injury Buttocks Bilateral Stage 1 -  Intact skin with non-blanchable redness of a localized area usually over a bony prominence. (Active)   Nutritional Assessment: Body mass index is 20.85 kg/m.[name] Seen by dietician.  I agree with the assessment and plan as outlined below: Nutrition Status: Nutrition Problem: Moderate Malnutrition Etiology: chronic illness (ESRD on HD/DM and gastroparesis) Signs/Symptoms: severe muscle depletion, mild fat depletion Interventions: Refer to RD note for recommendations  . Skin Assessment: I have examined the patient's skin and I agree with the wound assessment as performed by the wound care RN as outlined below: Wound 05/19/24 1933 Pressure Injury Buttocks Bilateral Stage 1 -  Intact skin with non-blanchable redness of a localized area usually over a bony prominence. (Active)    Consultants:  General surgery, palliative care, nephrology, neurology  Procedures:  As above  Antimicrobials:  Anti-infectives (From admission, onward)    Start     Dose/Rate Route Frequency Ordered Stop   06/18/24 2030  ceFAZolin  (ANCEF ) IVPB 2g/100 mL premix  Status:  Discontinued        2 g 200 mL/hr  over 30 Minutes Intravenous Every 8 hours 06/18/24 1454 06/19/24 1608   06/18/24 0600  ceFAZolin  (ANCEF ) IVPB 2g/100 mL premix        2 g 200 mL/hr over 30 Minutes Intravenous On call to O.R. 06/15/24 1152 06/18/24 1230   06/06/24 1400  piperacillin -tazobactam (ZOSYN ) IVPB 2.25 g        2.25 g 100 mL/hr over 30 Minutes Intravenous Every 8 hours 06/06/24 1017 06/09/24 0538   06/06/24 1200  vancomycin  (VANCOREADY) IVPB 500 mg/100 mL        500 mg 100 mL/hr over 60 Minutes Intravenous Every M-W-F (Hemodialysis) 06/04/24 1745 06/06/24 1900   06/04/24 1845  vancomycin  (VANCOCIN ) IVPB 1000 mg/200 mL premix        1,000 mg 200 mL/hr over 60 Minutes Intravenous  Once 06/04/24 1745 06/04/24 1915   06/04/24 1830  ceFEPIme  (MAXIPIME ) 2 g in sodium chloride  0.9 % 100 mL IVPB  Status:  Discontinued        2 g 200 mL/hr over 30 Minutes Intravenous Every M-W-F (Hemodialysis) 06/04/24 1743 06/06/24 1017   05/27/24 1015  cefTRIAXone  (ROCEPHIN ) 2 g in sodium chloride  0.9 % 100 mL IVPB        2 g 200 mL/hr over 30 Minutes Intravenous Every 24 hours 05/27/24 0918 05/29/24 1014   05/23/24 1530  ceFEPIme  (MAXIPIME ) 1 g in sodium chloride  0.9 % 100 mL IVPB  Status:  Discontinued        1 g 200 mL/hr over 30 Minutes Intravenous Every 24 hours 05/23/24 1433 05/27/24 0918   05/22/24  2200  Ampicillin -Sulbactam (UNASYN ) 3 g in sodium chloride  0.9 % 100 mL IVPB  Status:  Discontinued        3 g 200 mL/hr over 30 Minutes Intravenous Every 24 hours 05/22/24 0834 05/23/24 1433   05/22/24 0930  Ampicillin -Sulbactam (UNASYN ) 3 g in sodium chloride  0.9 % 100 mL IVPB        3 g 200 mL/hr over 30 Minutes Intravenous  Once 05/22/24 0834 05/22/24 1900         Subjective: Patient evaluated while in HD today. Seems lethargic but comfortable appearing. Occasional fidgeting.  Objective: Vitals:   06/26/24 1939 06/27/24 0009 06/27/24 0426 06/27/24 0500  BP: 130/69 121/62 123/61   Pulse: 87 82 81   Resp: 20 20 18     Temp: 98.5 F (36.9 C) 97.9 F (36.6 C) 98.2 F (36.8 C)   TempSrc: Oral Oral Oral   SpO2: 97% 99% 98%   Weight:    53.4 kg  Height:       No intake or output data in the 24 hours ending 06/27/24 0758  Filed Weights   06/25/24 1458 06/25/24 1832 06/27/24 0500  Weight: 52.9 kg 51.4 kg 53.4 kg     Examination: General: Chronically ill middle-age woman laying in HD bed. No acute distress. CV: RRR. No murmurs, rubs, or gallops. No LE edema Pulmonary: Anterior lung sounds clear to auscultation. Normal effort. Abdominal: Soft, nontender, nondistended. Normal bowel sounds. Extremities: RUE AVF with palpable thrill. Palpable pedal pulses. Skin: Warm and dry. No obvious rash or lesions. Neuro: Alert and oriented x 2. Generalized weakness. Moves all extremities. Normal sensation to light touch.    Data Reviewed: I have personally reviewed following labs and imaging studies  CBC: Recent Labs  Lab 06/21/24 0505 06/22/24 0644 06/23/24 0600 06/24/24 0827 06/25/24 1412  WBC 5.3 5.1 4.1 4.9 4.0  NEUTROABS  --   --   --  3.4  --   HGB 9.6* 9.1* 8.9* 9.5* 8.6*  HCT 29.7* 28.1* 27.9* 28.4* 26.2*  MCV 98.0 97.2 97.6 94.0 94.2  PLT 167 137* 124* 135* 120*   Basic Metabolic Panel: Recent Labs  Lab 06/21/24 0505 06/22/24 0644 06/23/24 0600 06/25/24 1412  NA 133* 135 132* 127*  K 3.6 3.4* 4.0 5.0  CL 90* 88* 88* 88*  CO2 26 29 26 25   GLUCOSE 140* 141* 146* 146*  BUN 30* 66* 97* 86*  CREATININE 2.24* 4.10* 5.36* 4.84*  CALCIUM  10.7* 11.1* 10.9* 10.0  PHOS 3.6 4.5 4.6 4.6   GFR: Estimated Creatinine Clearance: 12.5 mL/min (A) (by C-G formula based on SCr of 4.84 mg/dL (H)). Liver Function Tests: Recent Labs  Lab 06/25/24 1412  ALBUMIN  3.4*   No results for input(s): LIPASE, AMYLASE in the last 168 hours. No results for input(s): AMMONIA in the last 168 hours. Coagulation Profile: No results for input(s): INR, PROTIME in the last 168 hours.  Cardiac  Enzymes: No results for input(s): CKTOTAL, CKMB, CKMBINDEX, TROPONINI in the last 168 hours. BNP (last 3 results) No results for input(s): PROBNP in the last 8760 hours. HbA1C: No results for input(s): HGBA1C in the last 72 hours. CBG: Recent Labs  Lab 06/26/24 1107 06/26/24 1646 06/26/24 2116 06/26/24 2340 06/27/24 0426  GLUCAP 134* 185* 123* 123* 125*   Lipid Profile: No results for input(s): CHOL, HDL, LDLCALC, TRIG, CHOLHDL, LDLDIRECT in the last 72 hours. Thyroid Function Tests: No results for input(s): TSH, T4TOTAL, FREET4, T3FREE, THYROIDAB in the last 72 hours.  Anemia Panel: No results for input(s): VITAMINB12, FOLATE, FERRITIN, TIBC, IRON , RETICCTPCT in the last 72 hours. Sepsis Labs: No results for input(s): PROCALCITON, LATICACIDVEN in the last 168 hours.  No results found for this or any previous visit (from the past 240 hours).   Radiology Studies: No results found.   Scheduled Meds:  amLODipine   5 mg Oral Once per day on Sunday Tuesday Thursday Saturday   carvedilol   25 mg Per Tube 2 times per day on Sunday Tuesday Thursday Saturday   Chlorhexidine  Gluconate Cloth  6 each Topical Q0600   cinacalcet   30 mg Oral Q M,W,F   cloNIDine   0.3 mg Per Tube Once per day on Sunday Tuesday Thursday Saturday   cycloSPORINE   200 mg Per Tube QPM   cycloSPORINE   225 mg Per Tube Daily   darbepoetin (ARANESP ) injection - DIALYSIS  100 mcg Subcutaneous Q Thu-1800   fiber  1 packet Per Tube TID   gabapentin   100 mg Oral Daily   insulin  aspart  0-9 Units Subcutaneous Q4H   lacosamide   100 mg Per Tube BID   multivitamin with minerals  1 tablet Per Tube Daily   mycophenolate   500 mg Per Tube BID   mouth rinse  15 mL Mouth Rinse 4 times per day   oxidized cellulose  1 each Topical Once   pantoprazole  (PROTONIX ) IV  40 mg Intravenous QHS   predniSONE   15 mg Per Tube Q breakfast   Continuous Infusions:  feeding supplement (NEPRO  CARB STEADY) 1,000 mL (06/26/24 1941)     LOS: 39 days   Claretta CHRISTELLA Alderman, MD [name]  06/27/2024, 7:58 AM   *Please note that this is a verbal dictation therefore any spelling or grammatical errors are due to the Dragon Medical One system interpretation.  Please page via Amion and do not message via secure chat for urgent patient care matters. Secure chat can be used for non urgent patient care matters.  How to contact the [facility] Attending or Consulting provider 7A - 7P or covering provider during after hours 7P -7A, for this patient?  Check the care team in [facility] and look for a) attending/consulting [facility] provider listed and b) the [facility] team listed. Page or secure chat 7A-7P. Log into [email or web address] and use [place] universal password to access. If you do not have the password, please contact the hospital operator. Locate the [facility] provider you are looking for under [name] and page to a number that you can be directly reached. If you still have difficulty reaching the provider, please page the [facility] (Director on Call) for the Hospitalists listed on amion for assistance.

## 2024-06-27 NOTE — Progress Notes (Signed)
 Occupational Therapy Treatment Patient Details Name: [name] [name] MRN: [id number] DOB: 1980-11-13 Today's Date: 06/27/2024   History of present illness 43 yo female presents 05/19/24 for left gaze and L side weakness. 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 Intubated 9/15-9/23. EVD 9/15- 10/15; s/p shunt 10/15. S/p laparoscopy with intra-abdominal assistance ventricular-peritoneal shunt placement 10/15. PMH: HTN, ESRD on HD s/p renal and pancreatic transplants, DM, gastroparesis, multiple strokes (ischemic and hemorrhagic) with prepontine SAH, hydrocephalus requiring EVD placement   OT comments  Pt received in supine, agreeable for OT session. Co-treat with PT to maximize participation following HD earlier today. Pt with flat affect, not fixating on visual stim throughout session. She needed mod A to come to sitting EOB and needed initial mod A to sustain static sitting. Able to accurately identify 5/5 items on tray table with focus on visual tracking, attention, and problem solving. Pt stood with max A +2 with cues for forward gaze & upright posture. Returned self to supine due to fatigue. Overall tolerated well; making slow progress. OT will continue to follow and progress as able.      If plan is discharge home, recommend the following:  Two people to help with walking and/or transfers;Two people to help with bathing/dressing/bathroom;Assistance with cooking/housework;Assist for transportation;Help with stairs or ramp for entrance   Equipment Recommendations  Other (comment) (defer)    Recommendations for Other Services      Precautions / Restrictions Precautions Precautions: Fall Recall of Precautions/Restrictions: Impaired Precaution/Restrictions Comments: PEG Restrictions Weight Bearing Restrictions Per Provider Order: No       Mobility Bed  Mobility Overal bed mobility: Needs Assistance Bed Mobility: Rolling, Sidelying to Sit, Sit to Supine Rolling: Mod assist, +2 for safety/equipment, +2 for physical assistance Sidelying to sit: Mod assist, +2 for safety/equipment, +2 for physical assistance   Sit to supine: Min assist   General bed mobility comments: assist for initiation to move towards EOB, pt returning self back to supine despite efforts to remain sitting upright    Transfers Overall transfer level: Needs assistance Equipment used: 2 person hand held assist Transfers: Sit to/from Stand Sit to Stand: Mod assist, +2 physical assistance, +2 safety/equipment           General transfer comment: Stood from bed height with cues for powering up, hand placement, sequencing. Cues for forward gaze and upright posture.     Balance Overall balance assessment: Needs assistance Sitting-balance support: Single extremity supported, No upper extremity supported, Feet supported Sitting balance-Leahy Scale: Poor Sitting balance - Comments: up to mod A to correct leaning primarily to the L side Postural control: Left lateral lean (anterior) Standing balance support: Bilateral upper extremity supported Standing balance-Leahy Scale: Zero Standing balance comment: max A +2 to sustain standing, cues for upright posture/gaze                           ADL either performed or assessed with clinical judgement   ADL   Eating/Feeding: NPO                                          Extremity/Trunk Assessment              Vision   Additional Comments: mildly dysconjugate gaze, limited  fixation on visual stimuli throughout; limited command following to formally assess   Perception     Praxis     Communication Communication Communication: Impaired Factors Affecting Communication: Other (comment) (limited verbally communicative)   Cognition Arousal: Lethargic Behavior During Therapy: Flat affect,  Restless Cognition: Cognition impaired, Difficult to assess Difficult to assess due to: Level of arousal   Awareness: Intellectual awareness impaired, Online awareness impaired   Attention impairment (select first level of impairment): Focused attention Executive functioning impairment (select all impairments): Initiation, Organization, Sequencing, Problem solving OT - Cognition Comments: flat affect, delayed processing, follows commands inconsistently                 Following commands: Impaired Following commands impaired: Follows one step commands inconsistently, Follows one step commands with increased time      Cueing   Cueing Techniques: Verbal cues, Gestural cues, Tactile cues, Visual cues  Exercises      Shoulder Instructions       General Comments pt participatory    Pertinent Vitals/ Pain       Pain Assessment Pain Assessment: No/denies pain Pain Score: 0-No pain  Home Living                                          Prior Functioning/Environment              Frequency  Min 2X/week        Progress Toward Goals  OT Goals(current goals can now be found in the care plan section)  Progress towards OT goals: Progressing toward goals (slowly)     Plan      Co-evaluation    PT/OT/SLP Co-Evaluation/Treatment: Yes Reason for Co-Treatment: For patient/therapist safety;To address functional/ADL transfers;Necessary to address cognition/behavior during functional activity;Complexity of the patient's impairments (multi-system involvement)   OT goals addressed during session: ADL's and self-care      AM-PAC OT 6 Clicks Daily Activity     Outcome Measure   Help from another person eating meals?: Total Help from another person taking care of personal grooming?: A Lot Help from another person toileting, which includes using toliet, bedpan, or urinal?: Total Help from another person bathing (including washing, rinsing, drying)?:  Total Help from another person to put on and taking off regular upper body clothing?: A Lot Help from another person to put on and taking off regular lower body clothing?: Total 6 Click Score: 8    End of Session Equipment Utilized During Treatment: Gait belt  OT Visit Diagnosis: Unsteadiness on feet (R26.81);Muscle weakness (generalized) (M62.81);History of falling (Z91.81);Other symptoms and signs involving cognitive function;Cognitive communication deficit (R41.841);Low vision, both eyes (H54.2) Symptoms and signs involving cognitive functions: Nontraumatic SAH   Activity Tolerance Patient tolerated treatment well   Patient Left in bed;with call bell/phone within reach;with bed alarm set;with restraints reapplied;with SCD's reapplied   Nurse Communication Mobility status        Time: 8653-8585 OT Time Calculation (min): 28 min  Charges: OT General Charges $OT Visit: 1 Visit OT Treatments $Self Care/Home Management : 8-22 mins  Aivan Fillingim D., MSOT, OTR/L Acute Rehabilitation Services [phone] Secure Chat Preferred  Rikki Milch 06/27/2024, 4:06 PM

## 2024-06-27 NOTE — Procedures (Signed)
 Patient seen on Hemodialysis. BP (!) 129/116   Pulse 87   Temp 97.9 F (36.6 C)   Resp 20   Ht 5' 3 (1.6 m)   Wt 53.4 kg   SpO2 100%   BMI 20.85 kg/m   QB 400, UF goal 1.8L Tolerating treatment without complaints at this time.   Gordy Blanch MD [facility]. Office # [phone] Pager # [phone] 10:41 AM

## 2024-06-27 NOTE — Progress Notes (Signed)
   Providing Compassionate, Quality Care - Together   Subjective: Patient recently returned from dialysis.  Objective: Vital signs in last 24 hours: Temp:  [97.5 F (36.4 C)-98.5 F (36.9 C)] 97.5 F (36.4 C) (10/24 1300) Pulse Rate:  [81-88] 86 (10/24 1300) Resp:  [14-21] 18 (10/24 1300) BP: (93-149)/(43-116) 93/65 (10/24 1300) SpO2:  [96 %-100 %] 100 % (10/24 1300) Weight:  [51.6 kg-53.4 kg] 51.6 kg (10/24 1208)  Intake/Output from previous day: No intake/output data recorded. Intake/Output this shift: Total I/O In: 0  Out: 1800 [Other:1800]  Responds to voice, oriented to self Delayed responses MAE, S/S intact Incision clean, dry, and intact  Lab Results: Recent Labs    06/25/24 1412 06/27/24 0849  WBC 4.0 3.5*  HGB 8.6* 7.8*  HCT 26.2* 24.3*  PLT 120* 115*   BMET Recent Labs    06/25/24 1412 06/27/24 0848  NA 127* 129*  K 5.0 4.1  CL 88* 87*  CO2 25 25  GLUCOSE 146* 145*  BUN 86* 73*  CREATININE 4.84* 4.28*  CALCIUM  10.0 10.3    Studies/Results: No results found.  Assessment/Plan: Patient underwent placement of VP shunt by Dr. Mavis on 06/18/2024. Her follow up scan showed improvement of ventriculomegaly.   LOS: 39 days   -Staples removed. -Continue supportive efforts. Awaiting LTAC placement.   I am in communication with my attending and they agree with the plan for this patient.   Gerard Beck, DNP, AGNP-C Nurse Practitioner  [facility] [address], [address], [place], [name] [address] P: [phone]    F: [phone]  06/27/2024, 3:45 PM

## 2024-06-27 NOTE — Progress Notes (Addendum)
 Physical Therapy Treatment Patient Details Name: [name] [name] MRN: [id number] DOB: [birth year] Today's Date: 06/27/2024   History of Present Illness 43 yo female presents 05/19/24 for left gaze and L side weakness. 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 Intubated 9/15-9/23. EVD 9/15- 10/15; s/p shunt 10/15. S/p laparoscopy with intra-abdominal assistance ventricular-peritoneal shunt placement 10/15. 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 In today's session, pt was able to intermittently respond to questions with one word answers. ModAx2 for supine to sit transition with increased assist likely due to lethargy and limited initiation. Once seated on EOB, pt was able to engage in reaching, cognition, and visual activities with ModA to CGA for seated balance. Pt had multidirectional leans with no attempts to correct. Pt continues to have L gaze preference with head rotated to the left. Able to correct with slight assist and frequent cueing. Towards the end of session, pt was able to stand with ModAx2 and [facility] with assist for hip and neck extension. Able to maintain stand for ~20 seconds before returning to sitting. Pt then initiated return to supine with MinA to adjust trunk. Continue to recommend post-acute rehab with TOC following. Acute PT to follow.    If plan is discharge home, recommend the following: A lot of help with walking and/or transfers;A lot of help with bathing/dressing/bathroom;Assistance with cooking/housework;Direct supervision/assist for medications management;Direct supervision/assist for financial management;Assist for  transportation;Help with stairs or ramp for entrance   Can travel by private vehicle      No  Equipment Recommendations  Hospital bed;Hoyer lift;Wheelchair (measurements PT);Wheelchair cushion (measurements PT);BSC/3in1       Precautions / Restrictions Precautions Precautions: Fall Recall of Precautions/Restrictions: Impaired Precaution/Restrictions Comments: LUQ PEG Restrictions Weight Bearing Restrictions Per Provider Order: No     Mobility  Bed Mobility Overal bed mobility: Needs Assistance Bed Mobility: Rolling, Sidelying to Sit, Sit to Supine Rolling: Mod assist, +2 for safety/equipment, +2 for physical assistance Sidelying to sit: Mod assist, +2 for safety/equipment, +2 for physical assistance   Sit to supine: Min assist   General bed mobility comments: assist needed to reach for rail with limited initiation from pt. ModAx2 to bring LE's off EOB and raise trunk. Pt initiating return to supine with MinA for slight trunk management    Transfers Overall transfer level: Needs assistance Equipment used: 2 person hand held assist Transfers: Sit to/from Stand Sit to Stand: +2 physical assistance, +2 safety/equipment, Mod assist    General transfer comment: ModAx2 to boost-up and steady with [facility]. Assist needed for hip and neck extension with L LE blocked     Balance Overall balance assessment: Needs assistance Sitting-balance support: Feet supported Sitting balance-Leahy Scale: Poor Sitting balance - Comments: ModA for multi-directional lean, primarily to the left Postural control: Right lateral lean, Posterior lean, Left lateral lean, Other (comment) (anterior) Standing balance support: Bilateral upper extremity supported Standing balance-Leahy Scale: Zero Standing balance comment: +2 A       Communication Communication Communication: Impaired Factors Affecting Communication: Other (comment) (limited verbalizations)  Cognition Arousal: Lethargic Behavior During  Therapy: Flat affect, Restless   PT - Cognitive impairments: Awareness, Memory, Attention, Initiation, Sequencing, Safety/Judgement, Problem solving, Orientation   Orientation  impairments: Place, Time, Situation    PT - Cognition Comments: Restless with pt attempting to pull and scratch multiple locations. Able to communicate with one word answers with increased time and repetition of question Following commands: Impaired Following commands impaired: Follows one step commands inconsistently, Follows one step commands with increased time    Cueing Cueing Techniques: Verbal cues, Gestural cues, Tactile cues, Visual cues        06/27/24 1525  Other Exercises  Other Exercises assisted shoulder retraction and cervical rotation to the right     Pertinent Vitals/Pain Pain Assessment Pain Assessment: No/denies pain Pain Location: denies pain, trying to scratch groin, head, chest, and abdomen Pain Intervention(s): Monitored during session     PT Goals (current goals can now be found in the care plan section) Acute Rehab PT Goals PT Goal Formulation: Patient unable to participate in goal setting Time For Goal Achievement: 07/05/24 Potential to Achieve Goals: Fair Progress towards PT goals: Progressing toward goals    Frequency    Min 2X/week       AM-PAC PT 6 Clicks Mobility   Outcome Measure  Help needed turning from your back to your side while in a flat bed without using bedrails?: A Lot Help needed moving from lying on your back to sitting on the side of a flat bed without using bedrails?: Total Help needed moving to and from a bed to a chair (including a wheelchair)?: Total Help needed standing up from a chair using your arms (e.g., wheelchair or bedside chair)?: Total Help needed to walk in hospital room?: Total Help needed climbing 3-5 steps with a railing? : Total 6 Click Score: 7    End of Session Equipment Utilized During Treatment: Gait belt Activity  Tolerance: Patient limited by lethargy;Patient limited by fatigue Patient left: in bed;with call bell/phone within reach;with bed alarm set Nurse Communication: Mobility status PT Visit Diagnosis: Unsteadiness on feet (R26.81);Other abnormalities of gait and mobility (R26.89);Muscle weakness (generalized) (M62.81);Difficulty in walking, not elsewhere classified (R26.2);Other symptoms and signs involving the nervous system (R29.898)     Time: 8571-8551 PT Time Calculation (min) (ACUTE ONLY): 20 min  Charges:      PT General Charges $$ ACUTE PT VISIT: 1 Visit                    Kate ORN, PT, DPT Secure Chat Preferred  Rehab Office [phone]   Kate BRAVO Wendolyn 06/27/2024, 3:38 PM

## 2024-06-27 NOTE — Progress Notes (Signed)
 PT Cancellation Note  Patient Details Name: [name] [name] MRN: [id number] DOB: 1981/03/19   Cancelled Treatment:    Reason Eval/Treat Not Completed: Patient at procedure or test/unavailable (Off unit for HD. Acute PT to re-attempt as schedule allows.)  Anaelle Dunton W, PT, DPT Secure Chat Preferred  Rehab Office [phone]   Kate BRAVO Wendolyn 06/27/2024, 8:51 AM

## 2024-06-27 NOTE — Progress Notes (Signed)
 OT Cancellation Note  Patient Details Name: [name] [name] MRN: [id number] DOB: 05-02-1981   Cancelled Treatment:    Reason Eval/Treat Not Completed: Other (comment) (Pt in HD, will continue efforts as able.)   Szymon Foiles D., MSOT, OTR/L Acute Rehabilitation Services [phone] Secure Chat Preferred  Alayja Armas 06/27/2024, 9:47 AM

## 2024-06-27 NOTE — TOC Progression Note (Signed)
 Transition of Care [facility]) - Progression Note    Patient Details  Name: [name] [name] MRN: [id number] Date of Birth: Aug 05, 1981  Transition of Care [facility]) CM/SW Contact  Luann SHAUNNA [place], [name] Phone Number: 06/27/2024, 3:06 PM  Clinical Narrative:     Appeal for LTAC is still pending.   CSW received voicemail from [facility] that they are on an admissions hold at this time. They cannot accept new medicare or medicaid patients at this time and pt has been out of their facility for more than 30 days so would not be considered a readmit. Not clear at this time when their admissions hold will be over.   Expected Discharge Plan: Long Term Acute Care (LTAC) Barriers to Discharge: Continued Medical Work up, Air traffic controller and Services In-house Referral: Clinical Social Work   Post Acute Care Choice: Skilled Nursing Facility Living arrangements for the past 2 months: Apartment                                       Social Drivers of Health (SDOH) Interventions SDOH Screenings   Food Insecurity: Patient Unable To Answer (05/20/2024)  Housing: Patient Unable To Answer (05/20/2024)  Transportation Needs: Patient Unable To Answer (05/20/2024)  Utilities: Patient Unable To Answer (05/20/2024)  Social Connections: Unknown (10/04/2022)   Received from [facility]  Stress: No Stress Concern Present (04/16/2024)   Received from [facility]  Tobacco Use: Medium Risk (06/18/2024)    Readmission Risk Interventions    05/20/2024    4:16 PM 03/31/2024   11:27 AM 11/17/2021   12:15 PM  Readmission Risk Prevention Plan  Transportation Screening Complete Complete Complete  Medication Review Oceanographer) Complete Complete Complete  PCP or Specialist appointment within 3-5 days of discharge Complete  Complete  HRI or Home Care Consult Complete Complete Complete  SW Recovery Care/Counseling Consult Complete Complete Complete   Palliative Care Screening Complete Not Applicable Not Applicable  Skilled Nursing Facility Complete Not Applicable Patient Refused

## 2024-06-27 NOTE — Plan of Care (Signed)
  Problem: Education: Goal: Ability to describe self-care measures that may prevent or decrease complications (Diabetes Survival Skills Education) will improve Outcome: Progressing Goal: Individualized Educational Video(s) Outcome: Progressing   Problem: Coping: Goal: Ability to adjust to condition or change in health will improve Outcome: Progressing   Problem: Fluid Volume: Goal: Ability to maintain a balanced intake and output will improve Outcome: Progressing   Problem: Health Behavior/Discharge Planning: Goal: Ability to identify and utilize available resources and services will improve Outcome: Progressing Goal: Ability to manage health-related needs will improve Outcome: Progressing   Problem: Metabolic: Goal: Ability to maintain appropriate glucose levels will improve Outcome: Progressing   Problem: Nutritional: Goal: Maintenance of adequate nutrition will improve Outcome: Progressing Goal: Progress toward achieving an optimal weight will improve Outcome: Progressing   Problem: Skin Integrity: Goal: Risk for impaired skin integrity will decrease Outcome: Progressing   Problem: Tissue Perfusion: Goal: Adequacy of tissue perfusion will improve Outcome: Progressing   Problem: Education: Goal: Knowledge of disease or condition will improve Outcome: Progressing Goal: Knowledge of secondary prevention will improve (MUST DOCUMENT ALL) Outcome: Progressing Goal: Knowledge of patient specific risk factors will improve (DELETE if not current risk factor) Outcome: Progressing   Problem: Intracerebral Hemorrhage Tissue Perfusion: Goal: Complications of Intracerebral Hemorrhage will be minimized Outcome: Progressing   Problem: Coping: Goal: Will verbalize positive feelings about self Outcome: Progressing Goal: Will identify appropriate support needs Outcome: Progressing   Problem: Health Behavior/Discharge Planning: Goal: Ability to manage health-related needs will  improve Outcome: Progressing Goal: Goals will be collaboratively established with patient/family Outcome: Progressing   Problem: Self-Care: Goal: Ability to participate in self-care as condition permits will improve Outcome: Progressing Goal: Verbalization of feelings and concerns over difficulty with self-care will improve Outcome: Progressing Goal: Ability to communicate needs accurately will improve Outcome: Progressing   Problem: Nutrition: Goal: Risk of aspiration will decrease Outcome: Progressing Goal: Dietary intake will improve Outcome: Progressing   Problem: Education: Goal: Knowledge of General Education information will improve Description: Including pain rating scale, medication(s)/side effects and non-pharmacologic comfort measures Outcome: Progressing   Problem: Health Behavior/Discharge Planning: Goal: Ability to manage health-related needs will improve Outcome: Progressing   Problem: Clinical Measurements: Goal: Ability to maintain clinical measurements within normal limits will improve Outcome: Progressing Goal: Will remain free from infection Outcome: Progressing Goal: Diagnostic test results will improve Outcome: Progressing Goal: Respiratory complications will improve Outcome: Progressing Goal: Cardiovascular complication will be avoided Outcome: Progressing   Problem: Activity: Goal: Risk for activity intolerance will decrease Outcome: Progressing   Problem: Nutrition: Goal: Adequate nutrition will be maintained Outcome: Progressing   Problem: Coping: Goal: Level of anxiety will decrease Outcome: Progressing   Problem: Elimination: Goal: Will not experience complications related to bowel motility Outcome: Progressing   Problem: Pain Managment: Goal: General experience of comfort will improve and/or be controlled Outcome: Progressing   Problem: Safety: Goal: Ability to remain free from injury will improve Outcome: Progressing    Problem: Skin Integrity: Goal: Risk for impaired skin integrity will decrease Outcome: Progressing   Problem: Education: Goal: Knowledge of the prescribed therapeutic regimen will improve Outcome: Progressing   Problem: Clinical Measurements: Goal: Usual level of consciousness will be regained or maintained. Outcome: Progressing Goal: Neurologic status will improve Outcome: Progressing Goal: Ability to maintain intracranial pressure will improve Outcome: Progressing   Problem: Skin Integrity: Goal: Demonstration of wound healing without infection will improve Outcome: Progressing

## 2024-06-27 NOTE — Plan of Care (Signed)
 Progressing

## 2024-06-28 DIAGNOSIS — I619 Nontraumatic intracerebral hemorrhage, unspecified: Secondary | ICD-10-CM | POA: Diagnosis not present

## 2024-06-28 LAB — GLUCOSE, CAPILLARY
Glucose-Capillary: 137 mg/dL — ABNORMAL HIGH (ref 70–99)
Glucose-Capillary: 120 mg/dL — ABNORMAL HIGH (ref 70–99)
Glucose-Capillary: 169 mg/dL — ABNORMAL HIGH (ref 70–99)
Glucose-Capillary: 131 mg/dL — ABNORMAL HIGH (ref 70–99)
Glucose-Capillary: 140 mg/dL — ABNORMAL HIGH (ref 70–99)

## 2024-06-28 MED ORDER — CAMPHOR-MENTHOL 0.5-0.5 % EX LOTN
TOPICAL_LOTION | CUTANEOUS | Status: DC | PRN
  Filled 2024-06-28: qty 222

## 2024-06-28 MED ORDER — MELATONIN 3 MG PO TABS
3.0000 mg | ORAL_TABLET | Freq: Every evening | ORAL | Status: DC | PRN
  Administered 2024-06-28 – 2024-08-09 (×8): 3 mg via ORAL
  Filled 2024-06-28 (×9): qty 1

## 2024-06-28 NOTE — Progress Notes (Signed)
 Patient ID: [name] [name], female   DOB: Aug 25, 1981, 43 y.o.   MRN: [id number] [facility] Progress Note   Assessment/ Plan:   1.  Acute right intracranial hemorrhage: Associated with uncontrolled hypertension and status post conversion from EVD to VP shunt on 06/18/2024 by neurosurgery.  Mental status appears to be improving but she continues to require total assistance for PT/OT.  Awaiting acceptance to LTAC after failure to meet CIR criteria. 2. ESRD following failure of renal allograft: Continue hemodialysis on Monday/Wednesday/Friday with next dialysis on 10/27 via right brachiocephalic fistula.  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  3. Anemia: Hemoglobin/hematocrit continue to remain stable without any overt blood loss.  She remains on Aranesp  100 mcg weekly. 4. CKD-MBD: With marginally elevated calcium  that is suspected to be largely from immobility.  Status post pamidronate on 06/15/2024 and started on Cinacalcet  30 mg 3 days a week. 5. Nutrition: Currently on tube feeds with Nepro along with renal MVI. 6. Hypertension: Intermittently elevated blood pressures noted on the current regimen, scheduled to get amlodipine  today.  Subjective:   Underwent hemodialysis uneventfully yesterday.  No acute events reported from overnight   Objective:   BP (!) 173/76 (BP Location: Left Arm)   Pulse 84   Temp 97.8 F (36.6 C) (Axillary)   Resp 18   Ht 5' 3 (1.6 m)   Wt 54.9 kg   SpO2 100%   BMI 21.44 kg/m   Physical Exam: Gen: Appears to be resting comfortably in bed, mother cleaning her up. CVS: Pulse regular rhythm, normal rate, S1 and S2 normal Resp: Anteriorly clear to auscultation, no rales/rhonchi Abd: Soft, flat, nontender, bowel sounds normal Ext: No lower extremity edema.  Right upper arm AV fistula with palpable thrill/intact  dressings Labs: BMET Recent Labs  Lab 06/22/24 0644 06/23/24 0600 06/25/24 1412 06/27/24 0848  NA 135 132* 127* 129*  K 3.4* 4.0 5.0 4.1  CL 88* 88* 88* 87*  CO2 29 26 25 25   GLUCOSE 141* 146* 146* 145*  BUN 66* 97* 86* 73*  CREATININE 4.10* 5.36* 4.84* 4.28*  CALCIUM  11.1* 10.9* 10.0 10.3  PHOS 4.5 4.6 4.6 5.0*   CBC Recent Labs  Lab 06/23/24 0600 06/24/24 0827 06/25/24 1412 06/27/24 0849  WBC 4.1 4.9 4.0 3.5*  NEUTROABS  --  3.4  --   --   HGB 8.9* 9.5* 8.6* 7.8*  HCT 27.9* 28.4* 26.2* 24.3*  MCV 97.6 94.0 94.2 98.0  PLT 124* 135* 120* 115*      Medications:     amLODipine   5 mg Oral Once per day on Sunday Tuesday Thursday Saturday   carvedilol   25 mg Per Tube 2 times per day on Sunday Tuesday Thursday Saturday   Chlorhexidine  Gluconate Cloth  6 each Topical Q0600   cinacalcet   30 mg Oral Q M,W,F   cloNIDine   0.3 mg Per Tube Once per day on Sunday Tuesday Thursday Saturday   cycloSPORINE   200 mg Per Tube QPM   cycloSPORINE   225 mg Per Tube Daily   darbepoetin (ARANESP ) injection - DIALYSIS  100 mcg Subcutaneous Q Thu-1800   fiber  1 packet Per Tube TID   gabapentin   100 mg Oral Daily   insulin  aspart  0-9 Units Subcutaneous Q4H   lacosamide   100 mg Per Tube BID   multivitamin with minerals  1 tablet Per Tube Daily   mycophenolate   500 mg Per Tube BID   mouth rinse  15 mL Mouth Rinse 4 times per day   oxidized cellulose  1 each Topical Once   pantoprazole  (PROTONIX ) IV  40 mg Intravenous QHS   predniSONE   15 mg Per Tube Q breakfast   Gordy Blanch, MD 06/28/2024, 8:38 AM

## 2024-06-28 NOTE — Progress Notes (Signed)
    Providing Compassionate, Quality Care - Together   NEUROSURGERY PROGRESS NOTE     S: No issues overnight. Mother states speech and mentation continues to improve.    O: EXAM:  BP (!) 99/52 (BP Location: Left Arm)   Pulse 77   Temp 98 F (36.7 C) (Axillary)   Resp 18   Ht 5' 3 (1.6 m)   Wt 54.9 kg   SpO2 97%   BMI 21.44 kg/m     Somnolent but arousable, NAD.  PERRL Incision c/d/i   ASSESSMENT:  43 y.o. female s/p VP shunt    PLAN: -Awaiting placement    [name] [name], [facility]

## 2024-06-28 NOTE — Progress Notes (Signed)
 PROGRESS NOTE    [name] [name]  FMW:[id number] DOB: Oct 09, 1980 DOA: 05/19/2024 PCP: [facility]   Brief Narrative:  [name] with PMH HTN, ischemic and hemorrhagic CVA, type 1 diabetes, gastroparesis, kidney and pancreas transplant 2014, subsequent failure of renal transplant, now ESRD on dialysis, anxiety, narcotic abuse. Presented to the [facility] ED from dialysis as a code stroke on 9/15; subsequently found to have hemorrhagic stroke. Neurology, neurosurgery, PCCM consulted. EVD was placed on the left side. Patient admitted to neuro ICU. She was intubated on admission. She had prolonged ICU stay with EVD drain which was changed to VP shunt on 10/15.  Extubated on 9/23. Transferred to [facility] service on 10/17.     She was recently discharged from here in August after she was managed for ischemic/hemorrhagic stroke and was discharged to LTAC.    Important events:  9/15 admitted with ICH plus IVH, EVD was placed for hydrocephalus, intubated 9/22 palliative care meeting.  Family is insistent that they want full scope of care 9/23 Extubated 10/1 -developed fevers pancultures drawn and broad-spectrum antibiotics started 10/2 became afebrile after starting antibiotics, did not tolerate raising EVD 10/6 EVD was raised from 10 to 30 cm water , tolerating well, remained afebrile 10/7 EVD is at 30 cm water , CT scan will be done tomorrow morning, no overnight issues 10/8 she became somnolent, CT head showed increasing hydrocephalus, EVD was titrated down to 10 cm of water  with improvement in mental status 10/15 VP shunt placement 10/17 transferred to progressive unit with [name]   Assessment & Plan:   Principal Problem:   Hemorrhagic stroke [facility]) Active Problems:   Moderate protein-calorie malnutrition   Pressure injury of skin   Nontraumatic subcortical hemorrhage of right cerebral hemisphere [facility])  Acute right parietotemporal intraparenchymal  hemorrhage/intraventricular hemorrhage Uncontrolled hypertension Obstructive hydrocephalus s/p EVD changed to VP shunt Seizure disorder s/p VP shunt placement 06/18/2024 by Dr. Mavis. Remains stable on Vimpat  100 mg twice daily. Neurosurgery following. Per them, no issue after the shunt.   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 Patient on CellCept  and prednisone . Dialyzed on MWF schedule. Has AV fistula on the right upper extremity.  Tolerated HD yesterday, next HD on Monday.   Acute bronchitis Resolved.    Intermittent low-grade fever: Thought to be secondary to intracranial hemorrhage. Previous blood cultures showed contamination. S/p antibiotics for respiratory cultures positive for Serratia and Klebsiella. Repeat cultures negative thus far. Patient afebrile for several days.   History of pancreatic transplantation: On cyclosporine , mycophenolate , prednisone    Hypertension:  Patient currently only on Coreg  and clonidine  on nondialysis days.  Patient's blood pressure remains elevated over 160 systolic on nondialysis days. Amlodipine  5 mg added on nondialysis days. SBP improved to the 110-140s.   Anemia of chronic disease: No signs of active bleeding. Hemoglobin stable   Type 1 diabetes with vasculopathy/gastroparesis: Not on long-term insulin  anymore due to pancreatic transplant. Monitor blood sugars. On sliding scale.   Moderate protein calorie malnutrition/dysphagia: Has PEG tube. Continue tube feeds.  SLP and dietitian following. Patient has dry cough.  Lungs clear to auscultation.  Likely due to dysphagia.   Stage I sacral pressure ulcer present on admission: Continue wound care   Debility/deconditioning/disposition/goals of care: Young patient with multiple comorbidities.  Palliative care consulted for goals of care conversations, family insist she remains full code.  Palliative has signed off 10/17.  Seen by PT  OT.  They recommended CIR.  Reviewed  by CIR, they recommend LTAC.  Peer to peer with insurance company was done by previous provider on 06/25/2024, and they denied LTAC.  They recommended LTC. I have informed TOC and LTAC representative.  LTAC wants to appeal, if appeal papers have been signed.  Diarrhea: Improved with Imodium .  DVT prophylaxis: SCDs Start: 05/19/24 1724   Code Status: Full Code  Family Communication: No family at bedside  Status is: Inpatient Remains inpatient appropriate because: Medically stable, pending placement, difficult to place.   Estimated body mass index is 21.44 kg/m as calculated from the following:   Height as of this encounter: 5' 3 (1.6 m).   Weight as of this encounter: 54.9 kg.  Wound 05/19/24 1933 Pressure Injury Buttocks Bilateral Stage 1 -  Intact skin with non-blanchable redness of a localized area usually over a bony prominence. (Active)   Nutritional Assessment: Body mass index is 21.44 kg/m.[name] Seen by dietician.  I agree with the assessment and plan as outlined below: Nutrition Status: Nutrition Problem: Moderate Malnutrition Etiology: chronic illness (ESRD on HD/DM and gastroparesis) Signs/Symptoms: severe muscle depletion, mild fat depletion Interventions: Refer to RD note for recommendations  . Skin Assessment: I have examined the patient's skin and I agree with the wound assessment as performed by the wound care RN as outlined below: Wound 05/19/24 1933 Pressure Injury Buttocks Bilateral Stage 1 -  Intact skin with non-blanchable redness of a localized area usually over a bony prominence. (Active)    Consultants:  General surgery, palliative care, nephrology, neurology  Procedures:  As above  Antimicrobials:  Anti-infectives (From admission, onward)    Start     Dose/Rate Route Frequency Ordered Stop   06/18/24 2030  ceFAZolin  (ANCEF ) IVPB 2g/100 mL premix  Status:  Discontinued        2 g 200 mL/hr over 30 Minutes  Intravenous Every 8 hours 06/18/24 1454 06/19/24 1608   06/18/24 0600  ceFAZolin  (ANCEF ) IVPB 2g/100 mL premix        2 g 200 mL/hr over 30 Minutes Intravenous On call to O.R. 06/15/24 1152 06/18/24 1230   06/06/24 1400  piperacillin -tazobactam (ZOSYN ) IVPB 2.25 g        2.25 g 100 mL/hr over 30 Minutes Intravenous Every 8 hours 06/06/24 1017 06/09/24 0538   06/06/24 1200  vancomycin  (VANCOREADY) IVPB 500 mg/100 mL        500 mg 100 mL/hr over 60 Minutes Intravenous Every M-W-F (Hemodialysis) 06/04/24 1745 06/06/24 1900   06/04/24 1845  vancomycin  (VANCOCIN ) IVPB 1000 mg/200 mL premix        1,000 mg 200 mL/hr over 60 Minutes Intravenous  Once 06/04/24 1745 06/04/24 1915   06/04/24 1830  ceFEPIme  (MAXIPIME ) 2 g in sodium chloride  0.9 % 100 mL IVPB  Status:  Discontinued        2 g 200 mL/hr over 30 Minutes Intravenous Every M-W-F (Hemodialysis) 06/04/24 1743 06/06/24 1017   05/27/24 1015  cefTRIAXone  (ROCEPHIN ) 2 g in sodium chloride  0.9 % 100 mL IVPB        2 g 200 mL/hr over 30 Minutes Intravenous Every 24 hours 05/27/24 0918 05/29/24 1014   05/23/24 1530  ceFEPIme  (MAXIPIME ) 1 g in sodium chloride  0.9 % 100 mL IVPB  Status:  Discontinued        1 g 200 mL/hr over 30 Minutes Intravenous Every 24 hours 05/23/24 1433 05/27/24 0918   05/22/24 2200  Ampicillin -Sulbactam (UNASYN ) 3 g in sodium chloride  0.9 % 100 mL IVPB  Status:  Discontinued        3 g 200 mL/hr over 30 Minutes Intravenous Every 24 hours 05/22/24 0834 05/23/24 1433   05/22/24 0930  Ampicillin -Sulbactam (UNASYN ) 3 g in sodium chloride  0.9 % 100 mL IVPB        3 g 200 mL/hr over 30 Minutes Intravenous  Once 05/22/24 0834 05/22/24 1900         Subjective: Patient evaluated at the bedside while sleeping comfortably. No acute changes.  Objective: Vitals:   06/27/24 2323 06/28/24 0351 06/28/24 0500 06/28/24 0719  BP: (!) 115/54 133/64  (!) 173/76  Pulse: 89 86  84  Resp: 17 17  18   Temp: 99.6 F (37.6 C) 97.7 F  (36.5 C)  97.8 F (36.6 C)  TempSrc: Axillary Axillary  Axillary  SpO2: 96% 97%  100%  Weight:   54.9 kg   Height:        Intake/Output Summary (Last 24 hours) at 06/28/2024 0758 Last data filed at 06/28/2024 0400 Gross per 24 hour  Intake 1972.59 ml  Output 1800 ml  Net 172.59 ml    Filed Weights   06/27/24 0831 06/27/24 1208 06/28/24 0500  Weight: 53.4 kg 51.6 kg 54.9 kg     Examination: General: Chronically ill middle-age woman sleeping in bed. No acute distress. CV: RRR. No murmurs, rubs, or gallops. No LE edema Pulmonary: Anterior lung sounds clear to auscultation. Normal effort. Abdominal: Soft, nontender, nondistended. Normal bowel sounds. Extremities: RUE AVF with palpable thrill. Palpable pedal pulses. Skin: Warm and dry. No obvious rash or lesions. Neuro: Eyes closed and sleeping. Moves all extremities. Normal sensation to light touch.    Data Reviewed: I have personally reviewed following labs and imaging studies  CBC: Recent Labs  Lab 06/22/24 0644 06/23/24 0600 06/24/24 0827 06/25/24 1412 06/27/24 0849  WBC 5.1 4.1 4.9 4.0 3.5*  NEUTROABS  --   --  3.4  --   --   HGB 9.1* 8.9* 9.5* 8.6* 7.8*  HCT 28.1* 27.9* 28.4* 26.2* 24.3*  MCV 97.2 97.6 94.0 94.2 98.0  PLT 137* 124* 135* 120* 115*   Basic Metabolic Panel: Recent Labs  Lab 06/22/24 0644 06/23/24 0600 06/25/24 1412 06/27/24 0848  NA 135 132* 127* 129*  K 3.4* 4.0 5.0 4.1  CL 88* 88* 88* 87*  CO2 29 26 25 25   GLUCOSE 141* 146* 146* 145*  BUN 66* 97* 86* 73*  CREATININE 4.10* 5.36* 4.84* 4.28*  CALCIUM  11.1* 10.9* 10.0 10.3  PHOS 4.5 4.6 4.6 5.0*   GFR: Estimated Creatinine Clearance: 14.2 mL/min (A) (by C-G formula based on SCr of 4.28 mg/dL (H)). Liver Function Tests: Recent Labs  Lab 06/25/24 1412 06/27/24 0848  ALBUMIN  3.4* 3.3*   No results for input(s): LIPASE, AMYLASE in the last 168 hours. No results for input(s): AMMONIA in the last 168 hours. Coagulation  Profile: No results for input(s): INR, PROTIME in the last 168 hours.  Cardiac Enzymes: No results for input(s): CKTOTAL, CKMB, CKMBINDEX, TROPONINI in the last 168 hours. BNP (last 3 results) No results for input(s): PROBNP in the last 8760 hours. HbA1C: No results for input(s): HGBA1C in the last 72 hours. CBG: Recent Labs  Lab 06/27/24 1259 06/27/24 1649 06/27/24 2055 06/27/24 2315 06/28/24 0355  GLUCAP 149* 98 135* 155* 137*   Lipid Profile: No results for input(s): CHOL, HDL, LDLCALC, TRIG, CHOLHDL, LDLDIRECT in the last 72 hours. Thyroid Function Tests: No results for input(s): TSH, T4TOTAL, FREET4, T3FREE, THYROIDAB  in the last 72 hours. Anemia Panel: No results for input(s): VITAMINB12, FOLATE, FERRITIN, TIBC, IRON , RETICCTPCT in the last 72 hours. Sepsis Labs: No results for input(s): PROCALCITON, LATICACIDVEN in the last 168 hours.  No results found for this or any previous visit (from the past 240 hours).   Radiology Studies: No results found.   Scheduled Meds:  amLODipine   5 mg Oral Once per day on Sunday Tuesday Thursday Saturday   carvedilol   25 mg Per Tube 2 times per day on Sunday Tuesday Thursday Saturday   Chlorhexidine  Gluconate Cloth  6 each Topical Q0600   cinacalcet   30 mg Oral Q M,W,F   cloNIDine   0.3 mg Per Tube Once per day on Sunday Tuesday Thursday Saturday   cycloSPORINE   200 mg Per Tube QPM   cycloSPORINE   225 mg Per Tube Daily   darbepoetin (ARANESP ) injection - DIALYSIS  100 mcg Subcutaneous Q Thu-1800   fiber  1 packet Per Tube TID   gabapentin   100 mg Oral Daily   insulin  aspart  0-9 Units Subcutaneous Q4H   lacosamide   100 mg Per Tube BID   multivitamin with minerals  1 tablet Per Tube Daily   mycophenolate   500 mg Per Tube BID   mouth rinse  15 mL Mouth Rinse 4 times per day   oxidized cellulose  1 each Topical Once   pantoprazole  (PROTONIX ) IV  40 mg Intravenous QHS    predniSONE   15 mg Per Tube Q breakfast   Continuous Infusions:  feeding supplement (NEPRO CARB STEADY) 1,000 mL (06/27/24 1826)     LOS: 40 days   Claretta CHRISTELLA Alderman, MD [name]  06/28/2024, 7:58 AM   *Please note that this is a verbal dictation therefore any spelling or grammatical errors are due to the Dragon Medical One system interpretation.  Please page via Amion and do not message via secure chat for urgent patient care matters. Secure chat can be used for non urgent patient care matters.  How to contact the [facility] Attending or Consulting provider 7A - 7P or covering provider during after hours 7P -7A, for this patient?  Check the care team in [facility] and look for a) attending/consulting [facility] provider listed and b) the [facility] team listed. Page or secure chat 7A-7P. Log into [email or web address] and use [facility] universal password to access. If you do not have the password, please contact the hospital operator. Locate the [facility] provider you are looking for under [name] and page to a number that you can be directly reached. If you still have difficulty reaching the provider, please page the [facility] (Director on Call) for the Hospitalists listed on amion for assistance.

## 2024-06-28 NOTE — Plan of Care (Signed)
  Problem: Metabolic: Goal: Ability to maintain appropriate glucose levels will improve Outcome: Progressing   Problem: Nutritional: Goal: Maintenance of adequate nutrition will improve Outcome: Progressing   Problem: Skin Integrity: Goal: Risk for impaired skin integrity will decrease Outcome: Progressing   Problem: Tissue Perfusion: Goal: Adequacy of tissue perfusion will improve Outcome: Progressing   Problem: Intracerebral Hemorrhage Tissue Perfusion: Goal: Complications of Intracerebral Hemorrhage will be minimized Outcome: Progressing   Problem: Health Behavior/Discharge Planning: Goal: Goals will be collaboratively established with patient/family Outcome: Progressing   Problem: Education: Goal: Ability to describe self-care measures that may prevent or decrease complications (Diabetes Survival Skills Education) will improve Outcome: Not Progressing Goal: Individualized Educational Video(s) Outcome: Not Progressing   Problem: Coping: Goal: Ability to adjust to condition or change in health will improve Outcome: Not Progressing   Problem: Fluid Volume: Goal: Ability to maintain a balanced intake and output will improve Outcome: Not Progressing   Problem: Health Behavior/Discharge Planning: Goal: Ability to identify and utilize available resources and services will improve Outcome: Not Progressing Goal: Ability to manage health-related needs will improve Outcome: Not Progressing   Problem: Nutritional: Goal: Progress toward achieving an optimal weight will improve Outcome: Not Progressing   Problem: Education: Goal: Knowledge of disease or condition will improve Outcome: Not Progressing Goal: Knowledge of secondary prevention will improve (MUST DOCUMENT ALL) Outcome: Not Progressing Goal: Knowledge of patient specific risk factors will improve (DELETE if not current risk factor) Outcome: Not Progressing   Problem: Coping: Goal: Will verbalize positive  feelings about self Outcome: Not Progressing Goal: Will identify appropriate support needs Outcome: Not Progressing   Problem: Health Behavior/Discharge Planning: Goal: Ability to manage health-related needs will improve Outcome: Not Progressing

## 2024-06-29 DIAGNOSIS — I619 Nontraumatic intracerebral hemorrhage, unspecified: Secondary | ICD-10-CM | POA: Diagnosis not present

## 2024-06-29 LAB — GLUCOSE, CAPILLARY
Glucose-Capillary: 118 mg/dL — ABNORMAL HIGH (ref 70–99)
Glucose-Capillary: 130 mg/dL — ABNORMAL HIGH (ref 70–99)
Glucose-Capillary: 140 mg/dL — ABNORMAL HIGH (ref 70–99)
Glucose-Capillary: 141 mg/dL — ABNORMAL HIGH (ref 70–99)
Glucose-Capillary: 147 mg/dL — ABNORMAL HIGH (ref 70–99)
Glucose-Capillary: 148 mg/dL — ABNORMAL HIGH (ref 70–99)
Glucose-Capillary: 161 mg/dL — ABNORMAL HIGH (ref 70–99)

## 2024-06-29 MED ORDER — GERHARDT'S BUTT CREAM
TOPICAL_CREAM | Freq: Three times a day (TID) | CUTANEOUS | Status: DC
  Administered 2024-07-03: 1 via TOPICAL
  Filled 2024-06-29: qty 60

## 2024-06-29 MED ORDER — GERHARDT'S BUTT CREAM
TOPICAL_CREAM | Freq: Four times a day (QID) | CUTANEOUS | Status: DC
  Filled 2024-06-29: qty 60

## 2024-06-29 NOTE — Progress Notes (Signed)
 PROGRESS NOTE    [name] [name]  FMW:[id number] DOB: 02-09-1981 DOA: 05/19/2024 PCP: [facility]   Brief Narrative:  [name] with PMH HTN, ischemic and hemorrhagic CVA, type 1 diabetes, gastroparesis, kidney and pancreas transplant 2014, subsequent failure of renal transplant, now ESRD on dialysis, anxiety, narcotic abuse. Presented to the [facility] ED from dialysis as a code stroke on 9/15; subsequently found to have hemorrhagic stroke. Neurology, neurosurgery, PCCM consulted. EVD was placed on the left side. Patient admitted to neuro ICU. She was intubated on admission. She had prolonged ICU stay with EVD drain which was changed to VP shunt on 10/15.  Extubated on 9/23. Transferred to [facility] service on 10/17.     She was recently discharged from here in August after she was managed for ischemic/hemorrhagic stroke and was discharged to LTAC.    Important events:  9/15 admitted with ICH plus IVH, EVD was placed for hydrocephalus, intubated 9/22 palliative care meeting.  Family is insistent that they want full scope of care 9/23 Extubated 10/1 -developed fevers pancultures drawn and broad-spectrum antibiotics started 10/2 became afebrile after starting antibiotics, did not tolerate raising EVD 10/6 EVD was raised from 10 to 30 cm water , tolerating well, remained afebrile 10/7 EVD is at 30 cm water , CT scan will be done tomorrow morning, no overnight issues 10/8 she became somnolent, CT head showed increasing hydrocephalus, EVD was titrated down to 10 cm of water  with improvement in mental status 10/15 VP shunt placement 10/17 transferred to progressive unit with [name]   Assessment & Plan:   Principal Problem:   Hemorrhagic stroke [facility]) Active Problems:   Moderate protein-calorie malnutrition   Pressure injury of skin   Nontraumatic subcortical hemorrhage of right cerebral hemisphere [facility])  Acute right parietotemporal intraparenchymal  hemorrhage/intraventricular hemorrhage Uncontrolled hypertension Obstructive hydrocephalus s/p EVD changed to VP shunt Seizure disorder s/p VP shunt placement 06/18/2024 by Dr. Mavis. Remains stable on Vimpat  100 mg twice daily. Neurosurgery following. Per them, no issue after the shunt.   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 Patient on CellCept  and prednisone . Dialyzed on MWF schedule. Has AV fistula on the right upper extremity. Last HD on Friday, next HD on Monday.   Acute bronchitis Resolved.    Intermittent low-grade fever: Thought to be secondary to intracranial hemorrhage. Previous blood cultures showed contamination. S/p antibiotics for respiratory cultures positive for Serratia and Klebsiella. Repeat cultures negative thus far. Patient afebrile for several days.   History of pancreatic transplantation: On cyclosporine , mycophenolate , prednisone    Hypertension:  Patient currently only on Coreg  and clonidine  on nondialysis days.  Patient's blood pressure remains elevated over 160 systolic on nondialysis days. Amlodipine  5 mg added on nondialysis days. BP elevated with SBP in the 150s to 170s this morning.  Pending amlodipine  today and repeat HD tomorrow.   Anemia of chronic disease: No signs of active bleeding. Hemoglobin stable   Type 1 diabetes with vasculopathy/gastroparesis: Not on long-term insulin  anymore due to pancreatic transplant. Monitor blood sugars. On sliding scale.   Moderate protein calorie malnutrition/dysphagia: Has PEG tube. Continue tube feeds.  SLP and dietitian following. Patient has dry cough. Lungs clear to auscultation. Likely due to dysphagia.   Stage I sacral pressure ulcer present on admission: Continue wound care   Debility/deconditioning/disposition/goals of care: Young patient with multiple comorbidities.  Palliative care consulted for goals of care conversations, family insist she  remains full code.  Palliative has signed off  10/17.  Seen by PT OT.  They recommended CIR.  Reviewed by CIR, they recommend LTAC.  Peer to peer with insurance company was done by previous provider on 06/25/2024, and they denied LTAC.  They recommended LTC. I have informed TOC and LTAC representative. LTAC wants to appeal, appeal papers have been signed.  Diarrhea: Per RN, patient has had about 7 loose stools since earlier this morning.  Advised to hold fiber supplement and give as needed Imodium .  DVT prophylaxis: SCDs Start: 05/19/24 1724   Code Status: Full Code  Family Communication: Discussed plan with mother at bedside.  Status is: Inpatient Remains inpatient appropriate because: Medically stable, pending placement, difficult to place.   Estimated body mass index is 20.39 kg/m as calculated from the following:   Height as of this encounter: 5' 3 (1.6 m).   Weight as of this encounter: 52.2 kg.  Wound 05/19/24 1933 Pressure Injury Buttocks Bilateral Stage 1 -  Intact skin with non-blanchable redness of a localized area usually over a bony prominence. (Active)   Nutritional Assessment: Body mass index is 20.39 kg/m.[name] Seen by dietician.  I agree with the assessment and plan as outlined below: Nutrition Status: Nutrition Problem: Moderate Malnutrition Etiology: chronic illness (ESRD on HD/DM and gastroparesis) Signs/Symptoms: severe muscle depletion, mild fat depletion Interventions: Refer to RD note for recommendations  . Skin Assessment: I have examined the patient's skin and I agree with the wound assessment as performed by the wound care RN as outlined below: Wound 05/19/24 1933 Pressure Injury Buttocks Bilateral Stage 1 -  Intact skin with non-blanchable redness of a localized area usually over a bony prominence. (Active)    Consultants:  General surgery, palliative care, nephrology, neurology  Procedures:  As above  Antimicrobials:  Anti-infectives (From admission,  onward)    Start     Dose/Rate Route Frequency Ordered Stop   06/18/24 2030  ceFAZolin  (ANCEF ) IVPB 2g/100 mL premix  Status:  Discontinued        2 g 200 mL/hr over 30 Minutes Intravenous Every 8 hours 06/18/24 1454 06/19/24 1608   06/18/24 0600  ceFAZolin  (ANCEF ) IVPB 2g/100 mL premix        2 g 200 mL/hr over 30 Minutes Intravenous On call to O.R. 06/15/24 1152 06/18/24 1230   06/06/24 1400  piperacillin -tazobactam (ZOSYN ) IVPB 2.25 g        2.25 g 100 mL/hr over 30 Minutes Intravenous Every 8 hours 06/06/24 1017 06/09/24 0538   06/06/24 1200  vancomycin  (VANCOREADY) IVPB 500 mg/100 mL        500 mg 100 mL/hr over 60 Minutes Intravenous Every M-W-F (Hemodialysis) 06/04/24 1745 06/06/24 1900   06/04/24 1845  vancomycin  (VANCOCIN ) IVPB 1000 mg/200 mL premix        1,000 mg 200 mL/hr over 60 Minutes Intravenous  Once 06/04/24 1745 06/04/24 1915   06/04/24 1830  ceFEPIme  (MAXIPIME ) 2 g in sodium chloride  0.9 % 100 mL IVPB  Status:  Discontinued        2 g 200 mL/hr over 30 Minutes Intravenous Every M-W-F (Hemodialysis) 06/04/24 1743 06/06/24 1017   05/27/24 1015  cefTRIAXone  (ROCEPHIN ) 2 g in sodium chloride  0.9 % 100 mL IVPB        2 g 200 mL/hr over 30 Minutes Intravenous Every 24 hours 05/27/24 0918 05/29/24 1014   05/23/24 1530  ceFEPIme  (MAXIPIME ) 1 g in sodium chloride  0.9 % 100 mL IVPB  Status:  Discontinued  1 g 200 mL/hr over 30 Minutes Intravenous Every 24 hours 05/23/24 1433 05/27/24 0918   05/22/24 2200  Ampicillin -Sulbactam (UNASYN ) 3 g in sodium chloride  0.9 % 100 mL IVPB  Status:  Discontinued        3 g 200 mL/hr over 30 Minutes Intravenous Every 24 hours 05/22/24 0834 05/23/24 1433   05/22/24 0930  Ampicillin -Sulbactam (UNASYN ) 3 g in sodium chloride  0.9 % 100 mL IVPB        3 g 200 mL/hr over 30 Minutes Intravenous  Once 05/22/24 0834 05/22/24 1900         Subjective: Patient evaluated at the bedside with mother in the room. Reports patient has been  sleeping a lot more over the last 48 hours after not sleeping well for a few days. She noticed multiple loose watery stools since this morning otherwise patient slowly progressing.  Objective: Vitals:   06/29/24 0044 06/29/24 0456 06/29/24 0728 06/29/24 0801  BP: 130/65 (!) 155/66  (!) 176/57  Pulse: 74 78  78  Resp: 16 16  18   Temp: 98.1 F (36.7 C) 97.9 F (36.6 C)  98 F (36.7 C)  TempSrc: Oral Oral  Oral  SpO2: 100% 97%  95%  Weight:   52.2 kg   Height:        Intake/Output Summary (Last 24 hours) at 06/29/2024 1253 Last data filed at 06/29/2024 0432 Gross per 24 hour  Intake 1349.33 ml  Output --  Net 1349.33 ml    Filed Weights   06/27/24 1208 06/28/24 0500 06/29/24 0728  Weight: 51.6 kg 54.9 kg 52.2 kg     Examination: General: Chronically ill middle-age woman sleeping in bed. No acute distress. CV: RRR. No murmurs, rubs, or gallops. No LE edema Pulmonary: Anterior lung sounds clear to auscultation. Normal effort. Abdominal: Soft, nontender, nondistended. Normal bowel sounds. Extremities: RUE AVF with palpable thrill. Palpable pedal pulses. Skin: Warm and dry. No obvious rash or lesions. Neuro: Eyes closed and sleeping. Moves all extremities. Normal sensation to light touch.    Data Reviewed: I have personally reviewed following labs and imaging studies  CBC: Recent Labs  Lab 06/23/24 0600 06/24/24 0827 06/25/24 1412 06/27/24 0849  WBC 4.1 4.9 4.0 3.5*  NEUTROABS  --  3.4  --   --   HGB 8.9* 9.5* 8.6* 7.8*  HCT 27.9* 28.4* 26.2* 24.3*  MCV 97.6 94.0 94.2 98.0  PLT 124* 135* 120* 115*   Basic Metabolic Panel: Recent Labs  Lab 06/23/24 0600 06/25/24 1412 06/27/24 0848  NA 132* 127* 129*  K 4.0 5.0 4.1  CL 88* 88* 87*  CO2 26 25 25   GLUCOSE 146* 146* 145*  BUN 97* 86* 73*  CREATININE 5.36* 4.84* 4.28*  CALCIUM  10.9* 10.0 10.3  PHOS 4.6 4.6 5.0*   GFR: Estimated Creatinine Clearance: 14.1 mL/min (A) (by C-G formula based on SCr of 4.28  mg/dL (H)). Liver Function Tests: Recent Labs  Lab 06/25/24 1412 06/27/24 0848  ALBUMIN  3.4* 3.3*   No results for input(s): LIPASE, AMYLASE in the last 168 hours. No results for input(s): AMMONIA in the last 168 hours. Coagulation Profile: No results for input(s): INR, PROTIME in the last 168 hours.  Cardiac Enzymes: No results for input(s): CKTOTAL, CKMB, CKMBINDEX, TROPONINI in the last 168 hours. BNP (last 3 results) No results for input(s): PROBNP in the last 8760 hours. HbA1C: No results for input(s): HGBA1C in the last 72 hours. CBG: Recent Labs  Lab 06/28/24 1941 06/29/24 0020  06/29/24 0405 06/29/24 0743 06/29/24 1137  GLUCAP 140* 161* 130* 147* 141*   Lipid Profile: No results for input(s): CHOL, HDL, LDLCALC, TRIG, CHOLHDL, LDLDIRECT in the last 72 hours. Thyroid Function Tests: No results for input(s): TSH, T4TOTAL, FREET4, T3FREE, THYROIDAB in the last 72 hours. Anemia Panel: No results for input(s): VITAMINB12, FOLATE, FERRITIN, TIBC, IRON , RETICCTPCT in the last 72 hours. Sepsis Labs: No results for input(s): PROCALCITON, LATICACIDVEN in the last 168 hours.  No results found for this or any previous visit (from the past 240 hours).   Radiology Studies: No results found.   Scheduled Meds:  amLODipine   5 mg Oral Once per day on Sunday Tuesday Thursday Saturday   carvedilol   25 mg Per Tube 2 times per day on Sunday Tuesday Thursday Saturday   Chlorhexidine  Gluconate Cloth  6 each Topical Q0600   cinacalcet   30 mg Oral Q M,W,F   cloNIDine   0.3 mg Per Tube Once per day on Sunday Tuesday Thursday Saturday   cycloSPORINE   200 mg Per Tube QPM   cycloSPORINE   225 mg Per Tube Daily   darbepoetin (ARANESP ) injection - DIALYSIS  100 mcg Subcutaneous Q Thu-1800   fiber  1 packet Per Tube TID   gabapentin   100 mg Oral Daily   Gerhardt's butt cream   Topical QID   insulin  aspart  0-9 Units Subcutaneous  Q4H   lacosamide   100 mg Per Tube BID   multivitamin with minerals  1 tablet Per Tube Daily   mycophenolate   500 mg Per Tube BID   mouth rinse  15 mL Mouth Rinse 4 times per day   oxidized cellulose  1 each Topical Once   pantoprazole  (PROTONIX ) IV  40 mg Intravenous QHS   predniSONE   15 mg Per Tube Q breakfast   Continuous Infusions:  feeding supplement (NEPRO CARB STEADY) 55 mL/hr at 06/29/24 0432     LOS: 41 days   Claretta CHRISTELLA Alderman, MD [name]  06/29/2024, 12:53 PM   *Please note that this is a verbal dictation therefore any spelling or grammatical errors are due to the Dragon Medical One system interpretation.  Please page via Amion and do not message via secure chat for urgent patient care matters. Secure chat can be used for non urgent patient care matters.  How to contact the [facility] Attending or Consulting provider 7A - 7P or covering provider during after hours 7P -7A, for this patient?  Check the care team in [facility] and look for a) attending/consulting [facility] provider listed and b) the [facility] team listed. Page or secure chat 7A-7P. Log into [email or web address] and use [facility] universal password to access. If you do not have the password, please contact the hospital operator. Locate the [facility] provider you are looking for under [name] and page to a number that you can be directly reached. If you still have difficulty reaching the provider, please page the [facility] (Director on Call) for the Hospitalists listed on amion for assistance.

## 2024-06-29 NOTE — Consult Note (Addendum)
 WOC Nurse Consult Note: Reason for Consult: moisture damage to buttocks from frequent diarrhea  Wound type:  Moisture Associated Skin Damage to medial buttocks/perirectal erythema with full and partial thickness skin loss red and moist  Pressure Injury POA: not pressure  Measurement: see nursing flowsheet  Wound bed: as above  Drainage (amount, consistency, odor) see nursing flowsheet  Periwound: Dressing procedure/placement/frequency: Cleanse sacrum/medial buttocks/perirectal area with soap and water , dry and apply Gerhardt's Butt Cream 3 times a day and prn soiling.    POC discussed with bedside nurse. WOC team will not follow. Re-consult if further needs arise.   Thank you,    Powell Bar MSN, RN-BC, [employer]

## 2024-06-29 NOTE — Plan of Care (Signed)
  Problem: Education: Goal: Ability to describe self-care measures that may prevent or decrease complications (Diabetes Survival Skills Education) will improve Outcome: Progressing Goal: Individualized Educational Video(s) Outcome: Progressing   Problem: Coping: Goal: Ability to adjust to condition or change in health will improve Outcome: Progressing   Problem: Fluid Volume: Goal: Ability to maintain a balanced intake and output will improve Outcome: Progressing   Problem: Health Behavior/Discharge Planning: Goal: Ability to identify and utilize available resources and services will improve Outcome: Progressing Goal: Ability to manage health-related needs will improve Outcome: Progressing   Problem: Metabolic: Goal: Ability to maintain appropriate glucose levels will improve Outcome: Progressing   Problem: Education: Goal: Knowledge of disease or condition will improve Outcome: Progressing Goal: Knowledge of secondary prevention will improve (MUST DOCUMENT ALL) Outcome: Progressing Goal: Knowledge of patient specific risk factors will improve (DELETE if not current risk factor) Outcome: Progressing

## 2024-06-29 NOTE — Progress Notes (Signed)
    Providing Compassionate, Quality Care - Together   NEUROSURGERY PROGRESS NOTE     S: NAEs o/n. Multiple loose stools today. Pt grabbing at head.    O: EXAM:  BP (!) 176/57 (BP Location: Left Arm)   Pulse 78   Temp 98 F (36.7 C) (Oral)   Resp 18   Ht 5' 3 (1.6 m)   Wt 52.2 kg   SpO2 95%   BMI 20.39 kg/m     Awake, alert, oriented  Speech fluent, appropriate  CNs grossly intact  MAEs, strength/sensation grossly intact Cranial incision redressed with xeroform and gauze/tape.  Abd incision c/d/i    ASSESSMENT:  43 y.o. female s/p VP shunt 10/15    PLAN: -Awaiting placement.  -Maintain dressing over cranial incision.  -Call w/ questions/concerns.   [name], [facility]

## 2024-06-29 NOTE — Plan of Care (Signed)
  Problem: Education: Goal: Ability to describe self-care measures that may prevent or decrease complications (Diabetes Survival Skills Education) will improve Outcome: Progressing Goal: Individualized Educational Video(s) Outcome: Progressing   Problem: Coping: Goal: Ability to adjust to condition or change in health will improve Outcome: Progressing   Problem: Fluid Volume: Goal: Ability to maintain a balanced intake and output will improve Outcome: Progressing   Problem: Health Behavior/Discharge Planning: Goal: Ability to identify and utilize available resources and services will improve Outcome: Progressing Goal: Ability to manage health-related needs will improve Outcome: Progressing   Problem: Metabolic: Goal: Ability to maintain appropriate glucose levels will improve Outcome: Progressing   Problem: Nutritional: Goal: Maintenance of adequate nutrition will improve Outcome: Progressing Goal: Progress toward achieving an optimal weight will improve Outcome: Progressing   Problem: Skin Integrity: Goal: Risk for impaired skin integrity will decrease Outcome: Progressing   Problem: Tissue Perfusion: Goal: Adequacy of tissue perfusion will improve Outcome: Progressing   Problem: Education: Goal: Knowledge of disease or condition will improve Outcome: Progressing Goal: Knowledge of secondary prevention will improve (MUST DOCUMENT ALL) Outcome: Progressing Goal: Knowledge of patient specific risk factors will improve (DELETE if not current risk factor) Outcome: Progressing   Problem: Intracerebral Hemorrhage Tissue Perfusion: Goal: Complications of Intracerebral Hemorrhage will be minimized Outcome: Progressing   Problem: Coping: Goal: Will verbalize positive feelings about self Outcome: Progressing Goal: Will identify appropriate support needs Outcome: Progressing   Problem: Health Behavior/Discharge Planning: Goal: Ability to manage health-related needs will  improve Outcome: Progressing Goal: Goals will be collaboratively established with patient/family Outcome: Progressing   Problem: Self-Care: Goal: Ability to participate in self-care as condition permits will improve Outcome: Progressing Goal: Verbalization of feelings and concerns over difficulty with self-care will improve Outcome: Progressing Goal: Ability to communicate needs accurately will improve Outcome: Progressing   Problem: Nutrition: Goal: Risk of aspiration will decrease Outcome: Progressing Goal: Dietary intake will improve Outcome: Progressing   Problem: Education: Goal: Knowledge of General Education information will improve Description: Including pain rating scale, medication(s)/side effects and non-pharmacologic comfort measures Outcome: Progressing   Problem: Health Behavior/Discharge Planning: Goal: Ability to manage health-related needs will improve Outcome: Progressing   Problem: Clinical Measurements: Goal: Ability to maintain clinical measurements within normal limits will improve Outcome: Progressing Goal: Will remain free from infection Outcome: Progressing Goal: Diagnostic test results will improve Outcome: Progressing Goal: Respiratory complications will improve Outcome: Progressing Goal: Cardiovascular complication will be avoided Outcome: Progressing   Problem: Activity: Goal: Risk for activity intolerance will decrease Outcome: Progressing   Problem: Nutrition: Goal: Adequate nutrition will be maintained Outcome: Progressing   Problem: Coping: Goal: Level of anxiety will decrease Outcome: Progressing   Problem: Elimination: Goal: Will not experience complications related to bowel motility Outcome: Progressing   Problem: Pain Managment: Goal: General experience of comfort will improve and/or be controlled Outcome: Progressing   Problem: Safety: Goal: Ability to remain free from injury will improve Outcome: Progressing    Problem: Skin Integrity: Goal: Risk for impaired skin integrity will decrease Outcome: Progressing   Problem: Education: Goal: Knowledge of the prescribed therapeutic regimen will improve Outcome: Progressing   Problem: Skin Integrity: Goal: Demonstration of wound healing without infection will improve Outcome: Progressing

## 2024-06-29 NOTE — Progress Notes (Signed)
 Patient ID: [name] [name], female   DOB: [birth year], 43 y.o.   MRN: [id number] [facility] Progress Note   Assessment/ Plan:   1.  Acute right intracranial hemorrhage: Associated with uncontrolled hypertension and status post conversion from EVD to VP shunt on 06/18/2024 by neurosurgery.  Mental status waxing/waning and she continues to require total assistance for PT/OT.  Awaiting acceptance to LTAC after failure to meet CIR criteria. 2. ESRD following failure of renal allograft: Continue hemodialysis on Monday/Wednesday/Friday with next dialysis ordered for tomorrow via right brachiocephalic fistula.  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  3. Anemia: Hemoglobin/hematocrit continue to remain stable without any overt blood loss.  She remains on Aranesp  100 mcg weekly. 4. CKD-MBD: With marginally elevated calcium  that is suspected to be largely from immobility.  Status post pamidronate on 06/15/2024 and started on Cinacalcet  30 mg 3 days a week. 5. Nutrition: Currently on tube feeds with Nepro along with renal MVI. 6. Hypertension: Intermittently elevated blood pressures noted on the current regimen, scheduled to get amlodipine  today.  Subjective:   No acute events reported from overnight and no concerns raised by her mother.   Objective:   BP (!) 155/66 (BP Location: Left Arm)   Pulse 78   Temp 97.9 F (36.6 C) (Oral)   Resp 16   Ht 5' 3 (1.6 m)   Wt 54.9 kg   SpO2 97%   BMI 21.44 kg/m   Physical Exam: Gen: Resting comfortably in bed, mother and NT cleaning her up. CVS: Pulse regular rhythm, normal rate, S1 and S2 normal Resp: Anteriorly clear to auscultation, no rales/rhonchi Abd: Soft, flat, nontender, bowel sounds normal Ext: No lower extremity edema.  Right upper arm AV fistula with palpable thrill  BMET Recent Labs  Lab  06/23/24 0600 06/25/24 1412 06/27/24 0848  NA 132* 127* 129*  K 4.0 5.0 4.1  CL 88* 88* 87*  CO2 26 25 25   GLUCOSE 146* 146* 145*  BUN 97* 86* 73*  CREATININE 5.36* 4.84* 4.28*  CALCIUM  10.9* 10.0 10.3  PHOS 4.6 4.6 5.0*   CBC Recent Labs  Lab 06/23/24 0600 06/24/24 0827 06/25/24 1412 06/27/24 0849  WBC 4.1 4.9 4.0 3.5*  NEUTROABS  --  3.4  --   --   HGB 8.9* 9.5* 8.6* 7.8*  HCT 27.9* 28.4* 26.2* 24.3*  MCV 97.6 94.0 94.2 98.0  PLT 124* 135* 120* 115*      Medications:     amLODipine   5 mg Oral Once per day on Sunday Tuesday Thursday Saturday   carvedilol   25 mg Per Tube 2 times per day on Sunday Tuesday Thursday Saturday   Chlorhexidine  Gluconate Cloth  6 each Topical Q0600   cinacalcet   30 mg Oral Q M,W,F   cloNIDine   0.3 mg Per Tube Once per day on Sunday Tuesday Thursday Saturday   cycloSPORINE   200 mg Per Tube QPM   cycloSPORINE   225 mg Per Tube Daily   darbepoetin (ARANESP ) injection - DIALYSIS  100 mcg Subcutaneous Q Thu-1800   fiber  1 packet Per Tube TID   gabapentin   100 mg Oral Daily   insulin  aspart  0-9 Units Subcutaneous Q4H   lacosamide   100 mg Per Tube BID   multivitamin with minerals  1 tablet Per Tube Daily   mycophenolate   500 mg Per Tube BID   mouth rinse  15 mL Mouth  Rinse 4 times per day   oxidized cellulose  1 each Topical Once   pantoprazole  (PROTONIX ) IV  40 mg Intravenous QHS   predniSONE   15 mg Per Tube Q breakfast   Gordy Blanch, MD 06/29/2024, 7:49 AM

## 2024-06-30 DIAGNOSIS — I619 Nontraumatic intracerebral hemorrhage, unspecified: Secondary | ICD-10-CM | POA: Diagnosis not present

## 2024-06-30 LAB — RENAL FUNCTION PANEL
Albumin: 3.1 g/dL — ABNORMAL LOW (ref 3.5–5.0)
Albumin: 3.1 g/dL — ABNORMAL LOW (ref 3.5–5.0)
Anion gap: 18 — ABNORMAL HIGH (ref 5–15)
Anion gap: 14 (ref 5–15)
BUN: 89 mg/dL — ABNORMAL HIGH (ref 6–20)
BUN: 56 mg/dL — ABNORMAL HIGH (ref 6–20)
CO2: 22 mmol/L (ref 22–32)
CO2: 25 mmol/L (ref 22–32)
Calcium: 9.9 mg/dL (ref 8.9–10.3)
Calcium: 9.5 mg/dL (ref 8.9–10.3)
Chloride: 78 mmol/L — ABNORMAL LOW (ref 98–111)
Chloride: 85 mmol/L — ABNORMAL LOW (ref 98–111)
Creatinine, Ser: 5.24 mg/dL — ABNORMAL HIGH (ref 0.44–1.00)
Creatinine, Ser: 3.48 mg/dL — ABNORMAL HIGH (ref 0.44–1.00)
GFR, Estimated: 10 mL/min — ABNORMAL LOW (ref >60)
GFR, Estimated: 16 mL/min — ABNORMAL LOW (ref >60)
Glucose, Bld: 121 mg/dL — ABNORMAL HIGH (ref 70–99)
Glucose, Bld: 110 mg/dL — ABNORMAL HIGH (ref 70–99)
Phosphorus: 4.4 mg/dL (ref 2.5–4.6)
Phosphorus: 2.6 mg/dL (ref 2.5–4.6)
Potassium: 4.4 mmol/L (ref 3.5–5.1)
Potassium: 3.8 mmol/L (ref 3.5–5.1)
Sodium: 118 mmol/L — CL (ref 135–145)
Sodium: 124 mmol/L — ABNORMAL LOW (ref 135–145)

## 2024-06-30 LAB — GLUCOSE, CAPILLARY
Glucose-Capillary: 126 mg/dL — ABNORMAL HIGH (ref 70–99)
Glucose-Capillary: 130 mg/dL — ABNORMAL HIGH (ref 70–99)
Glucose-Capillary: 121 mg/dL — ABNORMAL HIGH (ref 70–99)
Glucose-Capillary: 120 mg/dL — ABNORMAL HIGH (ref 70–99)

## 2024-06-30 LAB — CBC
HCT: 22.5 % — ABNORMAL LOW (ref 36.0–46.0)
Hemoglobin: 7.9 g/dL — ABNORMAL LOW (ref 12.0–15.0)
MCH: 31.7 pg (ref 26.0–34.0)
MCHC: 35.1 g/dL (ref 30.0–36.0)
MCV: 90.4 fL (ref 80.0–100.0)
Platelets: 125 K/uL — ABNORMAL LOW (ref 150–400)
RBC: 2.49 MIL/uL — ABNORMAL LOW (ref 3.87–5.11)
RDW: 14 % (ref 11.5–15.5)
WBC: 4.6 K/uL (ref 4.0–10.5)
nRBC: 0 % (ref 0.0–0.2)

## 2024-06-30 MED ORDER — AMLODIPINE BESYLATE 5 MG PO TABS
10.0000 mg | ORAL_TABLET | ORAL | Status: DC
  Administered 2024-07-01 – 2024-07-08 (×5): 10 mg via ORAL
  Filled 2024-06-30 (×6): qty 2

## 2024-06-30 NOTE — TOC Progression Note (Signed)
 Transition of Care [facility]) - Progression Note    Patient Details  Name: [name] [name] MRN: [id number] Date of Birth: Jun 04, 1981  Transition of Care [facility]) CM/SW Contact  [name], RN Phone Number: 06/30/2024, 4:11 PM  Clinical Narrative:     Pt's appeal for [facility] was denied. CM left VM with admission for [facility] to see if they will be able to take her back when ready.  IP Care management following.  Expected Discharge Plan: Long Term Acute Care (LTAC) Barriers to Discharge: Continued Medical Work up, Air Traffic Controller and Services In-house Referral: Clinical Social Work   Post Acute Care Choice: Skilled Nursing Facility Living arrangements for the past 2 months: Apartment                                       Social Drivers of Health (SDOH) Interventions SDOH Screenings   Food Insecurity: Patient Unable To Answer (05/20/2024)  Housing: Patient Unable To Answer (05/20/2024)  Transportation Needs: Patient Unable To Answer (05/20/2024)  Utilities: Patient Unable To Answer (05/20/2024)  Social Connections: Unknown (10/04/2022)   Received from [facility]  Stress: No Stress Concern Present (04/16/2024)   Received from [facility]  Tobacco Use: Medium Risk (06/18/2024)    Readmission Risk Interventions    05/20/2024    4:16 PM 03/31/2024   11:27 AM 11/17/2021   12:15 PM  Readmission Risk Prevention Plan  Transportation Screening Complete Complete Complete  Medication Review Oceanographer) Complete Complete Complete  PCP or Specialist appointment within 3-5 days of discharge Complete  Complete  HRI or Home Care Consult Complete Complete Complete  SW Recovery Care/Counseling Consult Complete Complete Complete  Palliative Care Screening Complete Not Applicable Not Applicable  Skilled Nursing Facility Complete Not Applicable Patient Refused

## 2024-06-30 NOTE — Progress Notes (Signed)
 [facility] Progress Note   Subjective:   Pt seen prior to HD, RN reports she vomited once and they had to pause her tube feels. She is alert but not responsive to ROS questions. AM labs showed Na 118- stat repeat ordered, continuing UF with HD   Objective Vitals:   06/30/24 0944 06/30/24 1005 06/30/24 1014 06/30/24 1030  BP: (!) 165/72 (!) 177/72 (!) 181/66 (!) 162/84  Pulse: 80 78 81 81  Resp: 16 18 (!) 22 18  Temp: 98.6 F (37 C)     TempSrc:      SpO2: 99% 100% 100% 100%  Weight: 56.4 kg     Height:       Physical Exam General: Alert female in NAD Heart: RRR, no murmurs, rubs or gallops Lungs: CTA bilaterally, respirations unlabored Abdomen: Soft, non-distended, +BS Extremities: No edema b/l lower extremities Dialysis Access:  RUE AVF being cannulated  Additional Objective Labs: Basic Metabolic Panel: Recent Labs  Lab 06/25/24 1412 06/27/24 0848 06/30/24 0901  NA 127* 129* 118*  K 5.0 4.1 4.4  CL 88* 87* 78*  CO2 25 25 22   GLUCOSE 146* 145* 121*  BUN 86* 73* 89*  CREATININE 4.84* 4.28* 5.24*  CALCIUM  10.0 10.3 9.9  PHOS 4.6 5.0* 4.4   Liver Function Tests: Recent Labs  Lab 06/25/24 1412 06/27/24 0848 06/30/24 0901  ALBUMIN  3.4* 3.3* 3.1*   No results for input(s): LIPASE, AMYLASE in the last 168 hours. CBC: Recent Labs  Lab 06/24/24 0827 06/25/24 1412 06/27/24 0849 06/30/24 0901  WBC 4.9 4.0 3.5* 4.6  NEUTROABS 3.4  --   --   --   HGB 9.5* 8.6* 7.8* 7.9*  HCT 28.4* 26.2* 24.3* 22.5*  MCV 94.0 94.2 98.0 90.4  PLT 135* 120* 115* 125*   Blood Culture    Component Value Date/Time   SDES CSF 06/11/2024 0920   SPECREQUEST LP 06/11/2024 0920   CULT  06/11/2024 0920    NO GROWTH 3 DAYS Performed at [facility], [address], [place], [name] [address]    REPTSTATUS 06/14/2024 FINAL 06/11/2024 0920    Cardiac Enzymes: No results for input(s): CKTOTAL, CKMB, CKMBINDEX, TROPONINI in the last 168  hours. CBG: Recent Labs  Lab 06/29/24 1546 06/29/24 1922 06/29/24 2312 06/30/24 0401 06/30/24 0738  GLUCAP 140* 118* 148* 126* 130*   Iron  Studies: No results for input(s): IRON , TIBC, TRANSFERRIN, FERRITIN in the last 72 hours. @lablastinr3 @ Studies/Results: No results found. Medications:  feeding supplement (NEPRO CARB STEADY) 55 mL/hr at 06/30/24 0556    [START ON 07/01/2024] amLODipine   10 mg Oral Once per day on Sunday Tuesday Thursday Saturday   carvedilol   25 mg Per Tube 2 times per day on Sunday Tuesday Thursday Saturday   Chlorhexidine  Gluconate Cloth  6 each Topical Q0600   cinacalcet   30 mg Oral Q M,W,F   cloNIDine   0.3 mg Per Tube Once per day on Sunday Tuesday Thursday Saturday   cycloSPORINE   200 mg Per Tube QPM   cycloSPORINE   225 mg Per Tube Daily   darbepoetin (ARANESP ) injection - DIALYSIS  100 mcg Subcutaneous Q Thu-1800   fiber  1 packet Per Tube TID   gabapentin   100 mg Oral Daily   Gerhardt's butt cream   Topical TID   insulin  aspart  0-9 Units Subcutaneous Q4H   lacosamide   100 mg Per Tube BID   multivitamin with minerals  1 tablet Per Tube Daily   mycophenolate   500 mg Per Tube BID   mouth rinse  15 mL Mouth Rinse 4 times per day   oxidized cellulose  1 each Topical Once   pantoprazole  (PROTONIX ) IV  40 mg Intravenous QHS   predniSONE   15 mg Per Tube Q breakfast    Assessment/Plan: 1.  Acute right intracranial hemorrhage: Associated with uncontrolled hypertension and status post conversion from EVD to VP shunt on 06/18/2024 by neurosurgery.  Mental status waxing/waning and she continues to require total assistance for PT/OT.  Awaiting acceptance to LTAC after failure to meet CIR criteria. 2. ESRD following failure of renal allograft: Continue hemodialysis on Monday/Wednesday/Friday. 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  3. Anemia: Hemoglobin/hematocrit continue to remain stable without any overt blood loss.  She remains on Aranesp  100 mcg weekly. 4. CKD-MBD: With marginally elevated calcium  that is suspected to be largely from immobility.  Status post pamidronate on 06/15/2024 and started on Cinacalcet  30 mg 3 days a week. 5. Nutrition: Currently on tube feeds with Nepro along with renal MVI- holding right now due to episode of vomiting 6. Hypertension: Intermittently elevated blood pressures noted on the current regimen, amlodipine  started recently and UF with HD, see below. 7. Hyponatremia: Na of 118 reported this AM. Vomited once and tube feeds are on hold. Does not appear severely volume overloaded. Stat repeat and we are pulling UF with HD- increased goal  Lucie Collet, PA-C 06/30/2024, 10:42 AM  [facility] Pager: [phone]

## 2024-06-30 NOTE — Plan of Care (Signed)
   Problem: Education: Goal: Ability to describe self-care measures that may prevent or decrease complications (Diabetes Survival Skills Education) will improve Outcome: Progressing Goal: Individualized Educational Video(s) Outcome: Progressing   Problem: Coping: Goal: Ability to adjust to condition or change in health will improve Outcome: Progressing

## 2024-06-30 NOTE — Progress Notes (Signed)
 PT Cancellation Note  Patient Details Name: [name] [name] MRN: [id number] DOB: 06-03-1981   Cancelled Treatment:    Reason Eval/Treat Not Completed: Patient at procedure or test/unavailable (HD)  Aleck Daring, PT, DPT Acute Rehabilitation Services Office [phone]    [name] 06/30/2024, 10:05 AM

## 2024-06-30 NOTE — Progress Notes (Signed)
 PROGRESS NOTE    [name] [name]  FMW:[id number] DOB: 1981-06-15 DOA: 05/19/2024 PCP: [facility]   Brief Narrative:  [name] with PMH HTN, ischemic and hemorrhagic CVA, type 1 diabetes, gastroparesis, kidney and pancreas transplant 2014, subsequent failure of renal transplant, now ESRD on dialysis, anxiety, narcotic abuse. Presented to the [facility] ED from dialysis as a code stroke on 9/15; subsequently found to have hemorrhagic stroke. Neurology, neurosurgery, PCCM consulted. EVD was placed on the left side. Patient admitted to neuro ICU. She was intubated on admission. She had prolonged ICU stay with EVD drain which was changed to VP shunt on 10/15.  Extubated on 9/23. Transferred to [facility] service on 10/17.     She was recently discharged from here in August after she was managed for ischemic/hemorrhagic stroke and was discharged to LTAC.    Important events:  9/15 admitted with ICH plus IVH, EVD was placed for hydrocephalus, intubated 9/22 palliative care meeting.  Family is insistent that they want full scope of care 9/23 Extubated 10/1 -developed fevers pancultures drawn and broad-spectrum antibiotics started 10/2 became afebrile after starting antibiotics, did not tolerate raising EVD 10/6 EVD was raised from 10 to 30 cm water , tolerating well, remained afebrile 10/7 EVD is at 30 cm water , CT scan will be done tomorrow morning, no overnight issues 10/8 she became somnolent, CT head showed increasing hydrocephalus, EVD was titrated down to 10 cm of water  with improvement in mental status 10/15 VP shunt placement 10/17 transferred to progressive unit with [name]   Assessment & Plan:   Principal Problem:   Hemorrhagic stroke [facility]) Active Problems:   Moderate protein-calorie malnutrition   Pressure injury of skin   Nontraumatic subcortical hemorrhage of right cerebral hemisphere [facility])  Acute right parietotemporal intraparenchymal  hemorrhage/intraventricular hemorrhage Uncontrolled hypertension Obstructive hydrocephalus s/p EVD changed to VP shunt Seizure disorder s/p VP shunt placement 06/18/2024 by Dr. Mavis. Remains stable on Vimpat  100 mg twice daily. Neurosurgery following. Per them, no issue after the shunt.   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 Patient on CellCept  and prednisone . Dialyzed on MWF schedule. Has AV fistula on the right upper extremity.   Acute bronchitis Resolved.    Intermittent low-grade fever: Thought to be secondary to intracranial hemorrhage. Previous blood cultures showed contamination. S/p antibiotics for respiratory cultures positive for Serratia and Klebsiella. Repeat cultures negative thus far. Patient afebrile for several days.   History of pancreatic transplantation: On cyclosporine , mycophenolate , prednisone    Hypertension:  Patient currently on Coreg , amlodipine  and clonidine  on nondialysis days.  Patient's blood pressure remains elevated over 160 systolic and diastolic over 90 at times on nondialysis days.  Will increase amlodipine  to 10 mg.   Anemia of chronic disease: No signs of active bleeding. Hemoglobin stable   Type 1 diabetes with vasculopathy/gastroparesis: Not on long-term insulin  anymore due to pancreatic transplant. Monitor blood sugars. On sliding scale.   Moderate protein calorie malnutrition/dysphagia: Has PEG tube. Continue tube feeds.  SLP and dietitian following. Patient has dry cough. Lungs clear to auscultation. Likely due to dysphagia.   Stage I sacral pressure ulcer present on admission: Continue wound care   Debility/deconditioning/disposition/goals of care: Young patient with multiple comorbidities.  Palliative care consulted for goals of care conversations, family insist she remains full code.  Palliative has signed off 10/17.  Seen by PT OT.  They recommended CIR.  Reviewed by CIR,  they recommend LTAC.  Peer to peer  with insurance company was done by myself on 06/25/2024, and they denied LTAC.  They recommended LTC.  Appeal for LTAC in process.  Diarrhea: Continue Imodium .  DVT prophylaxis: SCDs Start: 05/19/24 1724   Code Status: Full Code  Family Communication: Discussed plan with mother at bedside.  Status is: Inpatient Remains inpatient appropriate because: Medically stable, pending placement,    Estimated body mass index is 20.39 kg/m as calculated from the following:   Height as of this encounter: 5' 3 (1.6 m).   Weight as of this encounter: 52.2 kg.  Wound 05/19/24 1933 Pressure Injury Buttocks Bilateral Stage 1 -  Intact skin with non-blanchable redness of a localized area usually over a bony prominence. (Active)   Nutritional Assessment: Body mass index is 20.39 kg/m.[name] Seen by dietician.  I agree with the assessment and plan as outlined below: Nutrition Status: Nutrition Problem: Moderate Malnutrition Etiology: chronic illness (ESRD on HD/DM and gastroparesis) Signs/Symptoms: severe muscle depletion, mild fat depletion Interventions: Refer to RD note for recommendations  . Skin Assessment: I have examined the patient's skin and I agree with the wound assessment as performed by the wound care RN as outlined below: Wound 05/19/24 1933 Pressure Injury Buttocks Bilateral Stage 1 -  Intact skin with non-blanchable redness of a localized area usually over a bony prominence. (Active)    Consultants:  General surgery, palliative care, nephrology, neurology  Procedures:  As above  Antimicrobials:  Anti-infectives (From admission, onward)    Start     Dose/Rate Route Frequency Ordered Stop   06/18/24 2030  ceFAZolin  (ANCEF ) IVPB 2g/100 mL premix  Status:  Discontinued        2 g 200 mL/hr over 30 Minutes Intravenous Every 8 hours 06/18/24 1454 06/19/24 1608   06/18/24 0600  ceFAZolin  (ANCEF ) IVPB 2g/100 mL premix        2 g 200 mL/hr over 30  Minutes Intravenous On call to O.R. 06/15/24 1152 06/18/24 1230   06/06/24 1400  piperacillin -tazobactam (ZOSYN ) IVPB 2.25 g        2.25 g 100 mL/hr over 30 Minutes Intravenous Every 8 hours 06/06/24 1017 06/09/24 0538   06/06/24 1200  vancomycin  (VANCOREADY) IVPB 500 mg/100 mL        500 mg 100 mL/hr over 60 Minutes Intravenous Every M-W-F (Hemodialysis) 06/04/24 1745 06/06/24 1900   06/04/24 1845  vancomycin  (VANCOCIN ) IVPB 1000 mg/200 mL premix        1,000 mg 200 mL/hr over 60 Minutes Intravenous  Once 06/04/24 1745 06/04/24 1915   06/04/24 1830  ceFEPIme  (MAXIPIME ) 2 g in sodium chloride  0.9 % 100 mL IVPB  Status:  Discontinued        2 g 200 mL/hr over 30 Minutes Intravenous Every M-W-F (Hemodialysis) 06/04/24 1743 06/06/24 1017   05/27/24 1015  cefTRIAXone  (ROCEPHIN ) 2 g in sodium chloride  0.9 % 100 mL IVPB        2 g 200 mL/hr over 30 Minutes Intravenous Every 24 hours 05/27/24 0918 05/29/24 1014   05/23/24 1530  ceFEPIme  (MAXIPIME ) 1 g in sodium chloride  0.9 % 100 mL IVPB  Status:  Discontinued        1 g 200 mL/hr over 30 Minutes Intravenous Every 24 hours 05/23/24 1433 05/27/24 0918   05/22/24 2200  Ampicillin -Sulbactam (UNASYN ) 3 g in sodium chloride  0.9 % 100 mL IVPB  Status:  Discontinued        3 g 200 mL/hr over 30 Minutes Intravenous Every 24 hours 05/22/24 0834  05/23/24 1433   05/22/24 0930  Ampicillin -Sulbactam (UNASYN ) 3 g in sodium chloride  0.9 % 100 mL IVPB        3 g 200 mL/hr over 30 Minutes Intravenous  Once 05/22/24 0834 05/22/24 1900         Subjective: Met with the mother in her room.  She had no concerns.  Patient was in dialysis.  Went to see patient in dialysis unit.  She was alert and oriented to self which she has been for last several days.  She did not endorse any complaint.  Objective: Vitals:   06/29/24 1958 06/30/24 0000 06/30/24 0504 06/30/24 0737  BP: 133/72 (!) 140/95 (!) 148/108 (!) 160/70  Pulse: 80 78 73 82  Resp: 18 15 16 16   Temp:  98.7 F (37.1 C) 97.9 F (36.6 C) 99.6 F (37.6 C) 97.9 F (36.6 C)  TempSrc: Oral Axillary Oral Oral  SpO2: 97% 97% 97% 94%  Weight:      Height:        Intake/Output Summary (Last 24 hours) at 06/30/2024 0847 Last data filed at 06/30/2024 0556 Gross per 24 hour  Intake 1397 ml  Output --  Net 1397 ml    Filed Weights   06/27/24 1208 06/28/24 0500 06/29/24 0728  Weight: 51.6 kg 54.9 kg 52.2 kg     Examination: General: Chronically ill middle-age woman appears in no acute distress. CV: RRR. No murmurs, rubs, or gallops. No LE edema Pulmonary: Anterior lung sounds clear to auscultation. Normal effort. Abdominal: Soft, nontender, nondistended. Normal bowel sounds. Extremities: RUE AVF with palpable thrill. Palpable pedal pulses. Skin: Warm and dry. No obvious rash or lesions. Neuro: Awake.  Moves all extremities. Normal sensation to light touch.    Data Reviewed: I have personally reviewed following labs and imaging studies  CBC: Recent Labs  Lab 06/24/24 0827 06/25/24 1412 06/27/24 0849  WBC 4.9 4.0 3.5*  NEUTROABS 3.4  --   --   HGB 9.5* 8.6* 7.8*  HCT 28.4* 26.2* 24.3*  MCV 94.0 94.2 98.0  PLT 135* 120* 115*   Basic Metabolic Panel: Recent Labs  Lab 06/25/24 1412 06/27/24 0848  NA 127* 129*  K 5.0 4.1  CL 88* 87*  CO2 25 25  GLUCOSE 146* 145*  BUN 86* 73*  CREATININE 4.84* 4.28*  CALCIUM  10.0 10.3  PHOS 4.6 5.0*   GFR: Estimated Creatinine Clearance: 14.1 mL/min (A) (by C-G formula based on SCr of 4.28 mg/dL (H)). Liver Function Tests: Recent Labs  Lab 06/25/24 1412 06/27/24 0848  ALBUMIN  3.4* 3.3*   No results for input(s): LIPASE, AMYLASE in the last 168 hours. No results for input(s): AMMONIA in the last 168 hours. Coagulation Profile: No results for input(s): INR, PROTIME in the last 168 hours.  Cardiac Enzymes: No results for input(s): CKTOTAL, CKMB, CKMBINDEX, TROPONINI in the last 168 hours. BNP (last 3  results) No results for input(s): PROBNP in the last 8760 hours. HbA1C: No results for input(s): HGBA1C in the last 72 hours. CBG: Recent Labs  Lab 06/29/24 1546 06/29/24 1922 06/29/24 2312 06/30/24 0401 06/30/24 0738  GLUCAP 140* 118* 148* 126* 130*   Lipid Profile: No results for input(s): CHOL, HDL, LDLCALC, TRIG, CHOLHDL, LDLDIRECT in the last 72 hours. Thyroid Function Tests: No results for input(s): TSH, T4TOTAL, FREET4, T3FREE, THYROIDAB in the last 72 hours. Anemia Panel: No results for input(s): VITAMINB12, FOLATE, FERRITIN, TIBC, IRON , RETICCTPCT in the last 72 hours. Sepsis Labs: No results for input(s): PROCALCITON,  LATICACIDVEN in the last 168 hours.  No results found for this or any previous visit (from the past 240 hours).   Radiology Studies: No results found.   Scheduled Meds:  [START ON 07/01/2024] amLODipine   10 mg Oral Once per day on Sunday Tuesday Thursday Saturday   carvedilol   25 mg Per Tube 2 times per day on Sunday Tuesday Thursday Saturday   Chlorhexidine  Gluconate Cloth  6 each Topical Q0600   cinacalcet   30 mg Oral Q M,W,F   cloNIDine   0.3 mg Per Tube Once per day on Sunday Tuesday Thursday Saturday   cycloSPORINE   200 mg Per Tube QPM   cycloSPORINE   225 mg Per Tube Daily   darbepoetin (ARANESP ) injection - DIALYSIS  100 mcg Subcutaneous Q Thu-1800   fiber  1 packet Per Tube TID   gabapentin   100 mg Oral Daily   Gerhardt's butt cream   Topical TID   insulin  aspart  0-9 Units Subcutaneous Q4H   lacosamide   100 mg Per Tube BID   multivitamin with minerals  1 tablet Per Tube Daily   mycophenolate   500 mg Per Tube BID   mouth rinse  15 mL Mouth Rinse 4 times per day   oxidized cellulose  1 each Topical Once   pantoprazole  (PROTONIX ) IV  40 mg Intravenous QHS   predniSONE   15 mg Per Tube Q breakfast   Continuous Infusions:  feeding supplement (NEPRO CARB STEADY) 55 mL/hr at 06/30/24 0556     LOS:  42 days   Fredia Skeeter, MD [name]  06/30/2024, 8:47 AM   *Please note that this is a verbal dictation therefore any spelling or grammatical errors are due to the Dragon Medical One system interpretation.  Please page via Amion and do not message via secure chat for urgent patient care matters. Secure chat can be used for non urgent patient care matters.  How to contact the [facility] Attending or Consulting provider 7A - 7P or covering provider during after hours 7P -7A, for this patient?  Check the care team in [facility] and look for a) attending/consulting [facility] provider listed and b) the [facility] team listed. Page or secure chat 7A-7P. Log into [email or web address] and use [place]'s universal password to access. If you do not have the password, please contact the hospital operator. Locate the [facility] provider you are looking for under [name] and page to a number that you can be directly reached. If you still have difficulty reaching the provider, please page the [facility] (Director on Call) for the Hospitalists listed on amion for assistance.

## 2024-06-30 NOTE — Progress Notes (Signed)
 Subjective: The patient is alert and pleasant.  She is in no apparent distress.  She is getting dialyzed.  Objective: Vital signs in last 24 hours: Temp:  [97.9 F (36.6 C)-99.6 F (37.6 C)] 98.6 F (37 C) (10/27 0944) Pulse Rate:  [73-84] 84 (10/27 1130) Resp:  [15-25] 16 (10/27 1130) BP: (99-181)/(62-108) 153/62 (10/27 1130) SpO2:  [94 %-100 %] 96 % (10/27 1130) Weight:  [56.4 kg] 56.4 kg (10/27 0944) Estimated body mass index is 22.03 kg/m as calculated from the following:   Height as of this encounter: 5' 3 (1.6 m).   Weight as of this encounter: 56.4 kg.   Intake/Output from previous day: 10/26 0701 - 10/27 0700 In: 1397 [NG/GT:[id number]] Out: -  Intake/Output this shift: No intake/output data recorded.  Physical exam the patient is alert and pleasant.  She answers questions.  She has no complaints.  Her scalp shunt incision is granulating.  There is no drainage.  Lab Results: Recent Labs    06/30/24 0901  WBC 4.6  HGB 7.9*  HCT 22.5*  PLT 125*   BMET Recent Labs    06/30/24 0901 06/30/24 1030  NA 118* 124*  K 4.4 3.8  CL 78* 85*  CO2 22 25  GLUCOSE 121* 110*  BUN 89* 56*  CREATININE 5.24* 3.48*  CALCIUM  9.9 9.5    Studies/Results: No results found.  Assessment/Plan: Status post ventriculoperitoneal shunt: The patient is improving neurologically day by day.  We will keep an eye on her shunt incision.  LOS: 42 days     [name] [name] 06/30/2024, 12:13 PM     Patient ID: [name] [name], female   DOB: [birth year], 43 y.o.   MRN: [id number]

## 2024-06-30 NOTE — Progress Notes (Signed)
 SLP Cancellation Note  Patient Details Name: [name] [name] MRN: [id number] DOB: 27-Oct-1980   Cancelled treatment:        Attempted to see pt for ongoing cognitive linguistic therapy.  Unfortunately, pt was off floor for HD at time of attempt.  SLP will reattempt as schedule permits.   Anette FORBES Grippe, MA, CCC-SLP Acute Rehabilitation Services Office: [phone] 06/30/2024, 9:44 AM

## 2024-07-01 DIAGNOSIS — I619 Nontraumatic intracerebral hemorrhage, unspecified: Secondary | ICD-10-CM | POA: Diagnosis not present

## 2024-07-01 LAB — GLUCOSE, CAPILLARY
Glucose-Capillary: 122 mg/dL — ABNORMAL HIGH (ref 70–99)
Glucose-Capillary: 122 mg/dL — ABNORMAL HIGH (ref 70–99)
Glucose-Capillary: 132 mg/dL — ABNORMAL HIGH (ref 70–99)
Glucose-Capillary: 153 mg/dL — ABNORMAL HIGH (ref 70–99)
Glucose-Capillary: 194 mg/dL — ABNORMAL HIGH (ref 70–99)
Glucose-Capillary: 87 mg/dL (ref 70–99)

## 2024-07-01 MED ORDER — DARBEPOETIN ALFA 150 MCG/0.3ML IJ SOSY
150.0000 ug | PREFILLED_SYRINGE | INTRAMUSCULAR | Status: DC
  Administered 2024-07-03 – 2024-07-10 (×2): 150 ug via SUBCUTANEOUS
  Filled 2024-07-01 (×2): qty 0.3

## 2024-07-01 MED ORDER — CHLORHEXIDINE GLUCONATE CLOTH 2 % EX PADS: 6.0000 | MEDICATED_PAD | Freq: Every day | CUTANEOUS | Status: DC

## 2024-07-01 NOTE — Progress Notes (Signed)
 PROGRESS NOTE    [name] [name]  FMW:[id number] DOB: [birth year] DOA: 05/19/2024 PCP: [facility]   Brief Narrative:  [name] with PMH HTN, ischemic and hemorrhagic CVA, type 1 diabetes, gastroparesis, kidney and pancreas transplant 2014, subsequent failure of renal transplant, now ESRD on dialysis, anxiety, narcotic abuse. Presented to the [facility] ED from dialysis as a code stroke on 9/15; subsequently found to have hemorrhagic stroke. Neurology, neurosurgery, PCCM consulted. EVD was placed on the left side. Patient admitted to neuro ICU. She was intubated on admission. She had prolonged ICU stay with EVD drain which was changed to VP shunt on 10/15.  Extubated on 9/23. Transferred to [facility] service on 10/17.     She was recently discharged from here in August after she was managed for ischemic/hemorrhagic stroke and was discharged to LTAC.    Important events:  9/15 admitted with ICH plus IVH, EVD was placed for hydrocephalus, intubated 9/22 palliative care meeting.  Family is insistent that they want full scope of care 9/23 Extubated 10/1 -developed fevers pancultures drawn and broad-spectrum antibiotics started 10/2 became afebrile after starting antibiotics, did not tolerate raising EVD 10/6 EVD was raised from 10 to 30 cm water , tolerating well, remained afebrile 10/7 EVD is at 30 cm water , CT scan will be done tomorrow morning, no overnight issues 10/8 she became somnolent, CT head showed increasing hydrocephalus, EVD was titrated down to 10 cm of water  with improvement in mental status 10/15 VP shunt placement 10/17 transferred to progressive unit with [name]   Assessment & Plan:   Principal Problem:   Hemorrhagic stroke [facility]) Active Problems:   Moderate protein-calorie malnutrition   Pressure injury of skin   Nontraumatic subcortical hemorrhage of right cerebral hemisphere [facility])  Acute right parietotemporal intraparenchymal  hemorrhage/intraventricular hemorrhage Uncontrolled hypertension Obstructive hydrocephalus s/p EVD changed to VP shunt Seizure disorder s/p VP shunt placement 06/18/2024 by Dr. Mavis. Remains stable on Vimpat  100 mg twice daily. Neurosurgery following. Per them, no issue after the shunt.   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 Patient on CellCept  and prednisone . Dialyzed on MWF schedule. Has AV fistula on the right upper extremity.   Acute bronchitis Resolved.    Intermittent low-grade fever: Thought to be secondary to intracranial hemorrhage. Previous blood cultures showed contamination. S/p antibiotics for respiratory cultures positive for Serratia and Klebsiella. Repeat cultures negative thus far. Patient afebrile for several days.   History of pancreatic transplantation: On cyclosporine , mycophenolate , prednisone    Hypertension:  Patient currently on Coreg , amlodipine  and clonidine  on nondialysis days.  Patient's blood pressure remains elevated over 160 systolic and diastolic over 90 at times on nondialysis days.  Will increase amlodipine  to 10 mg.   Anemia of chronic disease: No signs of active bleeding. Hemoglobin stable   Type 1 diabetes with vasculopathy/gastroparesis: Not on long-term insulin  anymore due to pancreatic transplant. Monitor blood sugars. On sliding scale.   Moderate protein calorie malnutrition/dysphagia: Has PEG tube. Continue tube feeds.  SLP and dietitian following. Patient has dry cough. Lungs clear to auscultation. Likely due to dysphagia.   Stage I sacral pressure ulcer present on admission: Continue wound care   Debility/deconditioning/disposition/goals of care: Young patient with multiple comorbidities.  Palliative care consulted for goals of care conversations, family insist she remains full code.  Palliative has signed off 10/17.  Seen by PT OT.  They recommended CIR.  Reviewed by CIR,  they recommend LTAC.  Peer to peer  with insurance company was done by myself on 06/25/2024, and they denied LTAC.  They recommended LTC.  Appeal for LTAC in process.  Diarrhea: Continue Imodium .  DVT prophylaxis: SCDs Start: 05/19/24 1724   Code Status: Full Code  Family Communication: Discussed plan with mother at bedside.  Status is: Inpatient Remains inpatient appropriate because: Medically stable, pending placement to LTC,    Estimated body mass index is 21.52 kg/m as calculated from the following:   Height as of this encounter: 5' 3 (1.6 m).   Weight as of this encounter: 55.1 kg.  Wound 05/19/24 1933 Pressure Injury Buttocks Bilateral Stage 1 -  Intact skin with non-blanchable redness of a localized area usually over a bony prominence. (Active)   Nutritional Assessment: Body mass index is 21.52 kg/m.[name] Seen by dietician.  I agree with the assessment and plan as outlined below: Nutrition Status: Nutrition Problem: Moderate Malnutrition Etiology: chronic illness (ESRD on HD/DM and gastroparesis) Signs/Symptoms: severe muscle depletion, mild fat depletion Interventions: Refer to RD note for recommendations  . Skin Assessment: I have examined the patient's skin and I agree with the wound assessment as performed by the wound care RN as outlined below: Wound 05/19/24 1933 Pressure Injury Buttocks Bilateral Stage 1 -  Intact skin with non-blanchable redness of a localized area usually over a bony prominence. (Active)    Consultants:  General surgery, palliative care, nephrology, neurology  Procedures:  As above  Antimicrobials:  Anti-infectives (From admission, onward)    Start     Dose/Rate Route Frequency Ordered Stop   06/18/24 2030  ceFAZolin  (ANCEF ) IVPB 2g/100 mL premix  Status:  Discontinued        2 g 200 mL/hr over 30 Minutes Intravenous Every 8 hours 06/18/24 1454 06/19/24 1608   06/18/24 0600  ceFAZolin  (ANCEF ) IVPB 2g/100 mL premix        2 g 200 mL/hr  over 30 Minutes Intravenous On call to O.R. 06/15/24 1152 06/18/24 1230   06/06/24 1400  piperacillin -tazobactam (ZOSYN ) IVPB 2.25 g        2.25 g 100 mL/hr over 30 Minutes Intravenous Every 8 hours 06/06/24 1017 06/09/24 0538   06/06/24 1200  vancomycin  (VANCOREADY) IVPB 500 mg/100 mL        500 mg 100 mL/hr over 60 Minutes Intravenous Every M-W-F (Hemodialysis) 06/04/24 1745 06/06/24 1900   06/04/24 1845  vancomycin  (VANCOCIN ) IVPB 1000 mg/200 mL premix        1,000 mg 200 mL/hr over 60 Minutes Intravenous  Once 06/04/24 1745 06/04/24 1915   06/04/24 1830  ceFEPIme  (MAXIPIME ) 2 g in sodium chloride  0.9 % 100 mL IVPB  Status:  Discontinued        2 g 200 mL/hr over 30 Minutes Intravenous Every M-W-F (Hemodialysis) 06/04/24 1743 06/06/24 1017   05/27/24 1015  cefTRIAXone  (ROCEPHIN ) 2 g in sodium chloride  0.9 % 100 mL IVPB        2 g 200 mL/hr over 30 Minutes Intravenous Every 24 hours 05/27/24 0918 05/29/24 1014   05/23/24 1530  ceFEPIme  (MAXIPIME ) 1 g in sodium chloride  0.9 % 100 mL IVPB  Status:  Discontinued        1 g 200 mL/hr over 30 Minutes Intravenous Every 24 hours 05/23/24 1433 05/27/24 0918   05/22/24 2200  Ampicillin -Sulbactam (UNASYN ) 3 g in sodium chloride  0.9 % 100 mL IVPB  Status:  Discontinued        3 g 200 mL/hr over 30 Minutes Intravenous Every 24 hours  05/22/24 0834 05/23/24 1433   05/22/24 0930  Ampicillin -Sulbactam (UNASYN ) 3 g in sodium chloride  0.9 % 100 mL IVPB        3 g 200 mL/hr over 30 Minutes Intravenous  Once 05/22/24 0834 05/22/24 1900         Subjective: Patient seen and examined in the room, multiple staff including PT OT and mother at the bedside.  Patient alert and oriented to self, no complaints.  Mother has no concerns.  Diarrhea resolved.  Mother is aware that LTAC was denied once again by the insurance company and [facility] is working on finding a place at [facility], most likely [place].  Objective: Vitals:   07/01/24 0432 07/01/24 0600 07/01/24 0724  07/01/24 0933  BP: (!) 172/79 (!) 152/62 (!) 159/60 (!) 163/69  Pulse: 80 88 86 88  Resp: 18  16   Temp: 98.6 F (37 C)  98.9 F (37.2 C)   TempSrc: Oral  Oral   SpO2:  94% 98%   Weight:      Height:        Intake/Output Summary (Last 24 hours) at 07/01/2024 1055 Last data filed at 06/30/2024 2100 Gross per 24 hour  Intake 60 ml  Output 3000 ml  Net -2940 ml    Filed Weights   06/29/24 0728 06/30/24 0944 06/30/24 1351  Weight: 52.2 kg 56.4 kg 55.1 kg     Examination: General: Chronically ill middle-age woman appears in no acute distress. CV: RRR. No murmurs, rubs, or gallops. No LE edema Pulmonary: Anterior lung sounds clear to auscultation. Normal effort. Abdominal: Soft, nontender, nondistended. Normal bowel sounds. Extremities: RUE AVF with palpable thrill. Palpable pedal pulses. Skin: Warm and dry. No obvious rash or lesions. Neuro: Awake.  Moves all extremities. Normal sensation to light touch.    Data Reviewed: I have personally reviewed following labs and imaging studies  CBC: Recent Labs  Lab 06/25/24 1412 06/27/24 0849 06/30/24 0901  WBC 4.0 3.5* 4.6  HGB 8.6* 7.8* 7.9*  HCT 26.2* 24.3* 22.5*  MCV 94.2 98.0 90.4  PLT 120* 115* 125*   Basic Metabolic Panel: Recent Labs  Lab 06/25/24 1412 06/27/24 0848 06/30/24 0901 06/30/24 1030  NA 127* 129* 118* 124*  K 5.0 4.1 4.4 3.8  CL 88* 87* 78* 85*  CO2 25 25 22 25   GLUCOSE 146* 145* 121* 110*  BUN 86* 73* 89* 56*  CREATININE 4.84* 4.28* 5.24* 3.48*  CALCIUM  10.0 10.3 9.9 9.5  PHOS 4.6 5.0* 4.4 2.6   GFR: Estimated Creatinine Clearance: 17.4 mL/min (A) (by C-G formula based on SCr of 3.48 mg/dL (H)). Liver Function Tests: Recent Labs  Lab 06/25/24 1412 06/27/24 0848 06/30/24 0901 06/30/24 1030  ALBUMIN  3.4* 3.3* 3.1* 3.1*   No results for input(s): LIPASE, AMYLASE in the last 168 hours. No results for input(s): AMMONIA in the last 168 hours. Coagulation Profile: No results for  input(s): INR, PROTIME in the last 168 hours.  Cardiac Enzymes: No results for input(s): CKTOTAL, CKMB, CKMBINDEX, TROPONINI in the last 168 hours. BNP (last 3 results) No results for input(s): PROBNP in the last 8760 hours. HbA1C: No results for input(s): HGBA1C in the last 72 hours. CBG: Recent Labs  Lab 06/30/24 1549 06/30/24 2008 07/01/24 0017 07/01/24 0421 07/01/24 0723  GLUCAP 121* 120* 122* 132* 87   Lipid Profile: No results for input(s): CHOL, HDL, LDLCALC, TRIG, CHOLHDL, LDLDIRECT in the last 72 hours. Thyroid Function Tests: No results for input(s): TSH, T4TOTAL, FREET4, T3FREE,  THYROIDAB in the last 72 hours. Anemia Panel: No results for input(s): VITAMINB12, FOLATE, FERRITIN, TIBC, IRON , RETICCTPCT in the last 72 hours. Sepsis Labs: No results for input(s): PROCALCITON, LATICACIDVEN in the last 168 hours.  No results found for this or any previous visit (from the past 240 hours).   Radiology Studies: No results found.   Scheduled Meds:  amLODipine   10 mg Oral Once per day on Sunday Tuesday Thursday Saturday   carvedilol   25 mg Per Tube 2 times per day on Sunday Tuesday Thursday Saturday   Chlorhexidine  Gluconate Cloth  6 each Topical Q0600   cinacalcet   30 mg Oral Q M,W,F   cloNIDine   0.3 mg Per Tube Once per day on Sunday Tuesday Thursday Saturday   cycloSPORINE   200 mg Per Tube QPM   cycloSPORINE   225 mg Per Tube Daily   darbepoetin (ARANESP ) injection - DIALYSIS  100 mcg Subcutaneous Q Thu-1800   fiber  1 packet Per Tube TID   gabapentin   100 mg Oral Daily   Gerhardt's butt cream   Topical TID   insulin  aspart  0-9 Units Subcutaneous Q4H   lacosamide   100 mg Per Tube BID   multivitamin with minerals  1 tablet Per Tube Daily   mycophenolate   500 mg Per Tube BID   mouth rinse  15 mL Mouth Rinse 4 times per day   oxidized cellulose  1 each Topical Once   pantoprazole  (PROTONIX ) IV  40 mg Intravenous  QHS   predniSONE   15 mg Per Tube Q breakfast   Continuous Infusions:  feeding supplement (NEPRO CARB STEADY) 1,000 mL (06/30/24 2044)     LOS: 43 days   Fredia Skeeter, MD [name]  07/01/2024, 10:55 AM   *Please note that this is a verbal dictation therefore any spelling or grammatical errors are due to the Dragon Medical One system interpretation.  Please page via Amion and do not message via secure chat for urgent patient care matters. Secure chat can be used for non urgent patient care matters.  How to contact the [facility] Attending or Consulting provider 7A - 7P or covering provider during after hours 7P -7A, for this patient?  Check the care team in [facility] and look for a) attending/consulting [facility] provider listed and b) the [facility] team listed. Page or secure chat 7A-7P. Log into [email or web address] and use [place]'s universal password to access. If you do not have the password, please contact the hospital operator. Locate the [facility] provider you are looking for under [name] and page to a number that you can be directly reached. If you still have difficulty reaching the provider, please page the [facility] (Director on Call) for the Hospitalists listed on amion for assistance.

## 2024-07-01 NOTE — Progress Notes (Signed)
 Physical Therapy Treatment Patient Details Name: [name] [name] MRN: [id number] DOB: 1981-06-04 Today's Date: 07/01/2024   History of Present Illness 43 yo female presents 05/19/24 for left gaze and L side weakness. 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 Intubated 9/15-9/23. EVD 9/15- 10/15; s/p shunt 10/15. S/p laparoscopy with intra-abdominal assistance ventricular-peritoneal shunt placement 10/15. 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 Pt demonstrates impaired initiation and attention requiring increased cues and assist during session to complete tasks. Pt requires max-max A +2 for bed mobility and transfers. Pt requires consistent assist for sitting balance due to decreased righting reactions and awareness. Pt distracted by trying to scratch chin and chest requiring redirection. Pt able to tolerate 3 standing trials, but demonstrates quick fatigue and is unable to lift feet to attempt steps. Pt continues to benefit from PT services to progress toward functional mobility goals.     If plan is discharge home, recommend the following: A lot of help with walking and/or transfers;A lot of help with bathing/dressing/bathroom;Assistance with cooking/housework;Direct supervision/assist for medications management;Direct supervision/assist for financial management;Assist for transportation;Help with stairs or ramp for entrance   Can travel by private vehicle        Equipment Recommendations  Hospital bed;Hoyer lift;Wheelchair (measurements PT);Wheelchair cushion (measurements PT);BSC/3in1    Recommendations for Other Services       Precautions / Restrictions  Precautions Precautions: Fall Recall of Precautions/Restrictions: Impaired Precaution/Restrictions Comments: PEG Restrictions Weight Bearing Restrictions Per Provider Order: No     Mobility  Bed Mobility Overal bed mobility: Needs Assistance Bed Mobility: Rolling, Sidelying to Sit, Sit to Supine Rolling: Max assist, +2 for safety/equipment Sidelying to sit: Max assist, +2 for safety/equipment, Used rails, HOB elevated   Sit to supine: Min assist   General bed mobility comments: increased assist to sit to EOB due to lack of initiation and command following. assist for all aspects    Transfers Overall transfer level: Needs assistance Equipment used: 2 person hand held assist Transfers: Sit to/from Stand Sit to Stand: +2 safety/equipment, +2 physical assistance, Max assist           General transfer comment: STS from EOB x3 with max A +2 for power up and facilitation of hip extension to reach upright posture. L knee blocked throughout and pt with quick fatigue and sinks back to EOB    Ambulation/Gait               General Gait Details: unable   Stairs             Wheelchair Mobility     Tilt Bed    Modified Rankin (Stroke Patients Only) Modified Rankin (Stroke Patients Only) Pre-Morbid Rankin Score: Moderately severe disability Modified Rankin: Severe disability     Balance Overall balance assessment: Needs assistance Sitting-balance support: Single extremity supported, No upper extremity supported, Feet supported Sitting balance-Leahy Scale: Poor Sitting balance - Comments: consistent mod A due to L lateral and posterior leans. Little righting responses     Standing balance-Leahy Scale: Zero Standing balance comment: max A +2 to sustain standing, cues for upright posture/gaze  Communication Communication Communication: Impaired Factors Affecting Communication: Difficulty expressing self;Reduced clarity of  speech  Cognition Arousal: Alert Behavior During Therapy: Flat affect, Restless   PT - Cognitive impairments: Awareness, Memory, Attention, Initiation, Sequencing, Safety/Judgement, Problem solving, Orientation                       PT - Cognition Comments: Pt verbally responds with one word answers with increased time Following commands: Impaired Following commands impaired: Follows one step commands inconsistently, Follows one step commands with increased time    Cueing Cueing Techniques: Verbal cues, Gestural cues, Tactile cues, Visual cues  Exercises      General Comments        Pertinent Vitals/Pain Pain Assessment Pain Assessment: Faces Faces Pain Scale: Hurts a little bit Pain Location: stomach Pain Descriptors / Indicators: Grimacing, Guarding, Other (Comment) Pain Intervention(s): Monitored during session     PT Goals (current goals can now be found in the care plan section) Acute Rehab PT Goals Patient Stated Goal: unable to state goal PT Goal Formulation: Patient unable to participate in goal setting Time For Goal Achievement: 07/05/24 Progress towards PT goals: Progressing toward goals    Frequency    Min 2X/week      PT Plan      Co-evaluation PT/OT/SLP Co-Evaluation/Treatment: Yes Reason for Co-Treatment: For patient/therapist safety;To address functional/ADL transfers;Necessary to address cognition/behavior during functional activity;Complexity of the patient's impairments (multi-system involvement) PT goals addressed during session: Mobility/safety with mobility;Balance        AM-PAC PT 6 Clicks Mobility   Outcome Measure  Help needed turning from your back to your side while in a flat bed without using bedrails?: A Lot Help needed moving from lying on your back to sitting on the side of a flat bed without using bedrails?: A Lot Help needed moving to and from a bed to a chair (including a wheelchair)?: Total Help needed standing up  from a chair using your arms (e.g., wheelchair or bedside chair)?: Total Help needed to walk in hospital room?: Total Help needed climbing 3-5 steps with a railing? : Total 6 Click Score: 8    End of Session Equipment Utilized During Treatment: Gait belt Activity Tolerance: Patient limited by fatigue;Patient tolerated treatment well Patient left: in bed;with call bell/phone within reach;with bed alarm set;with family/visitor present Nurse Communication: Mobility status PT Visit Diagnosis: Unsteadiness on feet (R26.81);Other abnormalities of gait and mobility (R26.89);Muscle weakness (generalized) (M62.81);Difficulty in walking, not elsewhere classified (R26.2);Other symptoms and signs involving the nervous system (R29.898)     Time: 8985-8958 PT Time Calculation (min) (ACUTE ONLY): 27 min  Charges:    $Therapeutic Activity: 8-22 mins PT General Charges $$ ACUTE PT VISIT: 1 Visit                     Darryle George, PTA Acute Rehabilitation Services Secure Chat Preferred  [phone]    Darryle George 07/01/2024, 12:43 PM

## 2024-07-01 NOTE — Progress Notes (Signed)
 Subjective: The patient is resting comfortably.  Her mother is at the bedside.  Objective: Vital signs in last 24 hours: Temp:  [98.5 F (36.9 C)-99.1 F (37.3 C)] 98.9 F (37.2 C) (10/28 0724) Pulse Rate:  [78-88] 86 (10/28 0724) Resp:  [13-25] 16 (10/28 0724) BP: (99-181)/(60-87) 159/60 (10/28 0724) SpO2:  [94 %-100 %] (P) 98 % (10/28 0724) Weight:  [55.1 kg-56.4 kg] 55.1 kg (10/27 1351) Estimated body mass index is 21.52 kg/m as calculated from the following:   Height as of this encounter: 5' 3 (1.6 m).   Weight as of this encounter: 55.1 kg.   Intake/Output from previous day: 10/27 0701 - 10/28 0700 In: 60  Out: 3000  Intake/Output this shift: No intake/output data recorded.  Physical exam patient is resting comfortably.  Lab Results: Recent Labs    06/30/24 0901  WBC 4.6  HGB 7.9*  HCT 22.5*  PLT 125*   BMET Recent Labs    06/30/24 0901 06/30/24 1030  NA 118* 124*  K 4.4 3.8  CL 78* 85*  CO2 22 25  GLUCOSE 121* 110*  BUN 89* 56*  CREATININE 5.24* 3.48*  CALCIUM  9.9 9.5    Studies/Results: No results found.  Assessment/Plan: Postop day #13 status post ventriculoperitoneal shunt: The patient is improving neurologically.  We will keep an eye on her scalp wound.  We are awaiting placement.  I have spoken to her mother.  LOS: 43 days     [name] 07/01/2024, 7:38 AM     Patient ID: [name], female   DOB: 1981-05-10, 43 y.o.   MRN: [id number]

## 2024-07-01 NOTE — Progress Notes (Signed)
 SLP Cancellation Note  Patient Details Name: [name] [name] MRN: [id number] DOB: 12/25/1980   Cancelled treatment:       Reason Eval/Treat Not Completed: Patient's level of consciousness Patient in bed with eyes closed, verbalizes yeah to mother for every question asked. She did not follow commands to open eyes, squeeze hand and did not respond verbally or non-verbally to SLP. She continues to not be adequately alert for benefit from skilled SLP intervention for cognition or language. Although no current orders for swallow evaluation, patient is not adequately alert for evaluation of her swallow function at this time. SLP to s/o.  Please reorder SLP for swallow evaluation if/when she is able to maintain adequate alertness.   Norleen IVAR Blase, MA, CCC-SLP Speech Therapy

## 2024-07-01 NOTE — TOC Progression Note (Addendum)
 Transition of Care [facility]) - Progression Note    Patient Details  Name: [name] [name] MRN: [id number] Date of Birth: 1981-08-25  [facility] CM/SW Contact  [name], RN Phone Number: 07/01/2024, 10:34 AM  Clinical Narrative:     CM spoke to [place] at [facility]. They are currently not admitting medicare/ medicaid pts. She hopes this will change soon. Pt faxed out to other facilities in [facility].  [facility] states they have started the process of switching her medicaid to LT medicaid.  IP Care management following.  Expected Discharge Plan: Long Term Acute Care (LTAC) Barriers to Discharge: Continued Medical Work up, Air Traffic Controller and Services In-house Referral: Clinical Social Work   Post Acute Care Choice: Skilled Nursing Facility Living arrangements for the past 2 months: Apartment                                       Social Drivers of Health (SDOH) Interventions SDOH Screenings   Food Insecurity: Patient Unable To Answer (05/20/2024)  Housing: Patient Unable To Answer (05/20/2024)  Transportation Needs: Patient Unable To Answer (05/20/2024)  Utilities: Patient Unable To Answer (05/20/2024)  Social Connections: Unknown (10/04/2022)   Received from [facility]  Stress: No Stress Concern Present (04/16/2024)   Received from [facility]  Tobacco Use: Medium Risk (06/18/2024)    Readmission Risk Interventions    05/20/2024    4:16 PM 03/31/2024   11:27 AM 11/17/2021   12:15 PM  Readmission Risk Prevention Plan  Transportation Screening Complete Complete Complete  Medication Review Oceanographer) Complete Complete Complete  PCP or Specialist appointment within 3-5 days of discharge Complete  Complete  HRI or Home Care Consult Complete Complete Complete  SW Recovery Care/Counseling Consult Complete Complete Complete  Palliative Care Screening Complete Not Applicable Not  Applicable  Skilled Nursing Facility Complete Not Applicable Patient Refused

## 2024-07-01 NOTE — Progress Notes (Signed)
 Occupational Therapy Treatment Patient Details Name: [name] [name] MRN: [id number] DOB: May 15, 1981 Today's Date: 07/01/2024   History of present illness 43 yo female presents 05/19/24 for left gaze and L side weakness. 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 Intubated 9/15-9/23. EVD 9/15- 10/15; s/p shunt 10/15. S/p laparoscopy with intra-abdominal assistance ventricular-peritoneal shunt placement 10/15. PMH: HTN, ESRD on HD s/p renal and pancreatic transplants, DM, gastroparesis, multiple strokes (ischemic and hemorrhagic) with prepontine SAH, hydrocephalus requiring EVD placement   OT comments  Pt greeted in supine, supportive mother at bedside. Focus of session today on ADL engagement, problem solving, attention, and sequencing. Pt needed max A +2 to come to sitting EOB and mod A consistently to maintain sitting in midline to prevent posterior/L lateral LOB. Pt tolerated sitting EOB ~12-15 mins, requiring max A for oral care with extensive multimodal cues and hand-over-hand for initiation and termination of task. Needed max A +2 to stand twice, fatiguing quickly, requires multiple cues to remain upright. Pt remains with poor motor planning, initiation, attention, problem solving, and other executive functions necessary for completion of ADLs/mobility. Generally weak and deconditioned. AMPAC 9/24 indicating reduced functional performance. OT to continue to follow and progress as able.      If plan is discharge home, recommend the following:  Two people to help with walking and/or transfers;Two people to help with bathing/dressing/bathroom;Assistance with cooking/housework;Assist for transportation;Help with stairs or ramp for entrance   Equipment Recommendations  Other (comment) (defer)    Recommendations for Other Services      Precautions / Restrictions  Precautions Precautions: Fall Recall of Precautions/Restrictions: Impaired Precaution/Restrictions Comments: PEG Restrictions Weight Bearing Restrictions Per Provider Order: No       Mobility Bed Mobility Overal bed mobility: Needs Assistance Bed Mobility: Rolling, Sidelying to Sit, Sit to Supine Rolling: Max assist, +2 for safety/equipment Sidelying to sit: Max assist, +2 for safety/equipment, Used rails, HOB elevated   Sit to supine: Min assist   General bed mobility comments: pt needing extensive multimodal cues for sequencing/processing in/out of bed, able to lift BLEs back into bed once prompted    Transfers Overall transfer level: Needs assistance Equipment used: 2 person hand held assist Transfers: Sit to/from Stand Sit to Stand: Max assist, +2 physical assistance, +2 safety/equipment           General transfer comment: Stood twice at EOB with max A +2, needs B knees blocked and facilitation at hips for extension and upright posture, cues for forward gaze     Balance Overall balance assessment: Needs assistance Sitting-balance support: Single extremity supported, Feet supported Sitting balance-Leahy Scale: Poor Sitting balance - Comments: consistently requiring up to mod A for sitting balance due to L lateral lean and posterior LOB/leans, poor righting/correction of balance Postural control: Left lateral lean, Posterior lean Standing balance support: Bilateral upper extremity supported Standing balance-Leahy Scale: Zero Standing balance comment: max A +2 to sustain standing, cues for upright posture/gaze                           ADL either performed or assessed with clinical judgement   ADL Overall ADL's : Needs assistance/impaired Eating/Feeding: NPO   Grooming: Maximal assistance;Sitting;Oral care Grooming Details (indicate cue type and reason): heavy cueing for initiation, motor planning, and hand-over-hand to initiate/terminate task; cues for  thoroughness (pt tending to only brush bottom  row of teeth)             Lower Body Dressing: Total assistance;Bed level Lower Body Dressing Details (indicate cue type and reason): adjusting B socks                    Extremity/Trunk Assessment Upper Extremity Assessment LUE Deficits / Details: actively uses during functional tasks, poor initiation & command following with LUE            Vision   Additional Comments: dysconjugate gaze, R gaze pref, limited fixation on visual stim in all visual fields   Perception     Praxis Praxis Praxis: Impaired Praxis Impairment Details: Motor planning;Initiation   Communication Communication Communication: Impaired Factors Affecting Communication: Difficulty expressing self;Reduced clarity of speech (hypophonic, soft spoken)   Cognition Arousal: Alert Behavior During Therapy: Flat affect, Restless Cognition: Cognition impaired, Difficult to assess Difficult to assess due to: Level of arousal Orientation impairments: Situation, Time (pt stating hospital after multiple prompts/choices) Awareness: Intellectual awareness impaired   Attention impairment (select first level of impairment): Focused attention Executive functioning impairment (select all impairments): Initiation, Organization, Sequencing, Problem solving OT - Cognition Comments: delayed processing, follows commands inconsistently, poor motor planning & initiation this date                 Following commands: Impaired Following commands impaired: Follows one step commands inconsistently, Follows one step commands with increased time      Cueing   Cueing Techniques: Verbal cues, Gestural cues, Tactile cues, Visual cues  Exercises      Shoulder Instructions       General Comments supportive mother present    Pertinent Vitals/ Pain       Pain Assessment Pain Assessment: Faces Faces Pain Scale: Hurts a little bit Pain Location: stomach,  generalized Pain Descriptors / Indicators: Grimacing, Guarding Pain Intervention(s): Monitored during session, Repositioned  Home Living                                          Prior Functioning/Environment              Frequency  Min 2X/week        Progress Toward Goals  OT Goals(current goals can now be found in the care plan section)  Progress towards OT goals: Progressing toward goals (slowly)     Plan      Co-evaluation    PT/OT/SLP Co-Evaluation/Treatment: Yes Reason for Co-Treatment: For patient/therapist safety;To address functional/ADL transfers;Necessary to address cognition/behavior during functional activity;Complexity of the patient's impairments (multi-system involvement) PT goals addressed during session: Mobility/safety with mobility;Balance OT goals addressed during session: ADL's and self-care      AM-PAC OT 6 Clicks Daily Activity     Outcome Measure   Help from another person eating meals?: Total Help from another person taking care of personal grooming?: A Lot Help from another person toileting, which includes using toliet, bedpan, or urinal?: Total Help from another person bathing (including washing, rinsing, drying)?: A Lot Help from another person to put on and taking off regular upper body clothing?: A Lot Help from another person to put on and taking off regular lower body clothing?: Total 6 Click Score: 9    End of Session Equipment Utilized During Treatment: Gait belt  OT Visit Diagnosis: Unsteadiness on feet (R26.81);Muscle weakness (generalized) (M62.81);History of falling (Z91.81);Other symptoms and  signs involving cognitive function;Cognitive communication deficit (R41.841);Low vision, both eyes (H54.2) Symptoms and signs involving cognitive functions: Nontraumatic SAH   Activity Tolerance Patient tolerated treatment well   Patient Left in bed;with call bell/phone within reach;with bed alarm set;with  family/visitor present;with restraints reapplied;with SCD's reapplied   Nurse Communication Mobility status        Time: 8985-8958 OT Time Calculation (min): 27 min  Charges: OT General Charges $OT Visit: 1 Visit OT Treatments $Self Care/Home Management : 8-22 mins  Uel Davidow D., MSOT, OTR/L Acute Rehabilitation Services [phone] Secure Chat Preferred  Rikki Milch 07/01/2024, 1:07 PM

## 2024-07-01 NOTE — NC FL2 (Signed)
 [place]  MEDICAID FL2 LEVEL OF CARE FORM     IDENTIFICATION  Patient Name: [name] [name] Djqjj Birthdate: [birth year] Sex: female Admission Date (Current Location): 05/19/2024  [facility] and [place] Number:  Producer, Television/film/video and Address:  [facility] [place], [address], [place], [name] [address]      Provider Number: [id number]  Attending Physician Name and Address:  Vernon Ranks, MD  Relative Name and Phone Number:       Current Level of Care: Hospital Recommended Level of Care: Skilled Nursing Facility Prior Approval Number:    Date Approved/Denied:   PASRR Number: [id number]  Discharge Plan: SNF    Current Diagnoses: Patient Active Problem List   Diagnosis Date Noted   Pressure injury of skin 06/02/2024   Nontraumatic subcortical hemorrhage of right cerebral hemisphere (HCC) 06/02/2024   Hemorrhagic stroke (HCC) 05/19/2024   Pontine hemorrhage (HCC) 02/15/2024   Malnutrition of moderate degree 02/15/2024   SAH (subarachnoid hemorrhage) (HCC) 02/15/2024   Colitis 02/14/2024   History of diabetic gastroparesis 02/14/2024   History of pancreatic islet cell transplantation 02/14/2024   Acute metabolic encephalopathy 02/14/2024   Prolonged QT interval 02/14/2024   Controlled type 2 diabetes mellitus with complication, without long-term current use of insulin  (HCC) 02/14/2024   Sepsis (HCC) 02/14/2024   Chronic, continuous use of opioids 11/12/2023   Need for acute hemodialysis due to missed session(HCC) 11/12/2023   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/12/2023   Vaginal bleeding 06/13/2023   ESRD needing dialysis (HCC) 03/07/2022   Chronic pain 03/07/2022   ESRD (end stage renal disease) (HCC)    Hypertensive emergency 01/31/2022   Hypertensive crisis 01/30/2022   Hypervolemia associated with renal insufficiency 12/23/2021   Hemorrhagic gastritis    Subdural hematoma (HCC) 12/02/2021   Acute blood loss anemia  12/02/2021   Chronic abdominal pain 12/02/2021   Thrombocytopenia 12/02/2021   Abnormal CT of the abdomen 12/02/2021   Type 1 diabetes (HCC) 12/02/2021   Fever 11/07/2021   Acute encephalopathy 11/06/2021   Pulmonary edema 11/06/2021   Hypertensive urgency 11/06/2021   Type 2 diabetes mellitus with peripheral neuropathy (HCC) 11/06/2021   History of renal transplant 11/06/2021   COVID-19 virus infection 11/06/2021   HTN (hypertension), malignant 08/02/2021   ESRD on dialysis (HCC) 02/25/2021   Coagulation defect, unspecified 12/22/2020   Acute cough 10/04/2020   Allergy, unspecified, initial encounter 05/21/2020   Anaphylactic shock, unspecified, initial encounter 05/21/2020   Pancytopenia (HCC) 02/27/2020   Shortness of breath 04/23/2019   Hypocalcemia 12/30/2018   Aortic atherosclerosis 12/27/2018   Acute respiratory failure with hypoxia (HCC) 12/22/2018   Hyperkalemia    Bilateral pneumonia 12/21/2018   Pancreas transplant status (HCC) 12/21/2018   Immunosuppression 12/21/2018   Chest pain 12/21/2018   CAP (community acquired pneumonia) 12/20/2018   Other staphylococcus as the cause of diseases classified elsewhere 08/16/2018   Moderate protein-calorie malnutrition 04/18/2018   Encounter for screening for respiratory tuberculosis 04/06/2018   Gout 04/06/2018   Iron  deficiency anemia, unspecified 04/06/2018   Leiomyoma of uterus, unspecified 04/06/2018   Secondary hyperparathyroidism of renal origin 04/06/2018   Acute rejection of kidney transplant 07/11/2017   Elevated serum creatinine 06/27/2017   E. coli UTI 12/18/2014   Transplant rejection 09/03/2014   Cyst of ovary, left 09/01/2014   Immunosuppressive management encounter following kidney transplant 08/26/2014   Acne 04/28/2014   Itch of skin 04/28/2014   Diabetic gastroparesis associated with type 1 diabetes mellitus (  HCC) 11/07/2013   Nausea and vomiting 11/07/2013   Lesion of spleen 10/07/2013   Metabolic  acidosis 09/25/2013   Patient's noncompliance with other medical treatment and regimen 09/12/2013   Narcotic dependence (HCC) 08/31/2013   Generalized pain 08/30/2013   Hypomagnesemia 08/30/2013   Failed kidney transplant 08/21/2013   Hypophosphatemia 07/28/2013   End stage renal disease (HCC) 02/06/2012   Pleural effusion, bilateral 11/16/2011   Anemia in chronic kidney disease (CKD) 11/16/2011   Pericardial effusion 11/15/2011   Fluid overload 11/15/2011   Abdominal pain, epigastric 11/15/2011   CKD (chronic kidney disease) stage 5, GFR less than 15 ml/min (HCC) 11/03/2011   Hypertension 10/27/2011   Renal failure, chronic 10/27/2011   MEDIAL EPICONDYLITIS, LEFT 12/17/2007   ABSCESS, AXILLA, LEFT 12/03/2007   Type 2 diabetes mellitus with other diabetic kidney complication (HCC) 11/05/2007    Orientation RESPIRATION BLADDER Height & Weight     Self  Normal  (ESRD) Weight: 121 lb 7.6 oz (55.1 kg) Height:  5' 3 (160 cm)  BEHAVIORAL SYMPTOMS/MOOD NEUROLOGICAL BOWEL NUTRITION STATUS      Incontinent Feeding tube (Nepro Carb Steady at 55 mL/hr)  AMBULATORY STATUS COMMUNICATION OF NEEDS Skin   Extensive Assist Verbally PU Stage and Appropriate Care, Surgical wounds (closed scalp wound) PU Stage 1 Dressing: No Dressing (bilateral buttocks)                     Personal Care Assistance Level of Assistance  Bathing, Feeding, Dressing Bathing Assistance: Maximum assistance Feeding assistance: Maximum assistance Dressing Assistance: Maximum assistance     Functional Limitations Info  Sight, Hearing, Speech Sight Info: Impaired Hearing Info: Impaired Speech Info: Impaired (dysarthria)    SPECIAL CARE FACTORS FREQUENCY  PT (By licensed PT), OT (By licensed OT)     PT Frequency: 5x/wk OT Frequency: 5x/wk            Contractures Contractures Info: Not present    Additional Factors Info  Code Status, Allergies Code Status Info: Full Allergies Info: NKA            Current Medications (07/01/2024):  This is the current hospital active medication list Current Facility-Administered Medications  Medication Dose Route Frequency Provider Last Rate Last Admin   acetaminophen  (TYLENOL ) tablet 650 mg  650 mg Per Tube Q4H PRN Mavis Purchase, MD   650 mg at 06/24/24 0845   Or   acetaminophen  (TYLENOL ) 160 MG/5ML solution 650 mg  650 mg Per Tube Q4H PRN Mavis Purchase, MD   650 mg at 06/19/24 0302   Or   acetaminophen  (TYLENOL ) suppository 650 mg  650 mg Rectal Q4H PRN Mavis Purchase, MD       amLODipine  (NORVASC ) tablet 10 mg  10 mg Oral Once per day on Sunday Tuesday Thursday Saturday Vernon Ranks, MD   10 mg at 07/01/24 9063   camphor-menthol  (SARNA) lotion   Topical PRN Rathore, Vasundhra, MD   Given at 06/28/24 2236   carvedilol  (COREG ) tablet 25 mg  25 mg Per Tube 2 times per day on Sunday Tuesday Thursday Saturday Osie Proper Q, RPH-CPP   25 mg at 07/01/24 0935   Chlorhexidine  Gluconate Cloth 2 % PADS 6 each  6 each Topical Q0600 Penninger, Manuelita, [place]   6 each at 07/01/24 9474   cinacalcet  (SENSIPAR ) tablet 30 mg  30 mg Oral Q M,W,F Tobie Gordy POUR, MD   30 mg at 06/27/24 1653   cloNIDine  (CATAPRES ) tablet 0.3 mg  0.3 mg Per  Tube Once per day on Sunday Tuesday Thursday Saturday Osie Sergio RODES, RPH-CPP   0.3 mg at 07/01/24 9055   cycloSPORINE  (GENGRAF ) 100 MG/ML microemulsion solution 200 mg  200 mg Per Tube QPM Mavis Purchase, MD   200 mg at 06/29/24 1843   cycloSPORINE  (GENGRAF ) 100 MG/ML microemulsion solution 225 mg  225 mg Per Tube Daily Mavis Purchase, MD   225 mg at 07/01/24 9063   Darbepoetin Alfa  (ARANESP ) injection 100 mcg  100 mcg Subcutaneous Q Thu-1800 Pham, Minh Q, RPH-CPP   100 mcg at 06/26/24 1835   feeding supplement (NEPRO CARB STEADY) liquid 1,000 mL  1,000 mL Per Tube Continuous Jenkins, Jeffrey, MD 55 mL/hr at 06/30/24 2044 1,000 mL at 06/30/24 2044   feeding supplement (NEPRO CARB STEADY) liquid 237 mL  237 mL Per Tube PRN Mavis Purchase, MD       fiber (NUTRISOURCE FIBER) 1 packet  1 packet Per Tube TID Mavis Purchase, MD   1 packet at 07/01/24 [phone]   gabapentin  (NEURONTIN ) capsule 100 mg  100 mg Oral Daily Tobie Gordy POUR, MD   100 mg at 07/01/24 9064   Gerhardt's butt cream   Topical TID Lou Claretta HERO, MD   Given at 07/01/24 9061   insulin  aspart (novoLOG ) injection 0-9 Units  0-9 Units Subcutaneous Q4H Mavis Purchase, MD   1 Units at 07/01/24 0435   labetalol  (NORMODYNE ) injection 20 mg  20 mg Intravenous Q4H PRN Mavis Purchase, MD   20 mg at 07/01/24 9557   lacosamide  (VIMPAT ) tablet 100 mg  100 mg Per Tube BID Mavis Purchase, MD   100 mg at 07/01/24 9065   loperamide  (IMODIUM ) capsule 4 mg  4 mg Oral PRN Pahwani, Ravi, MD   4 mg at 06/29/24 1135   melatonin tablet 3 mg  3 mg Oral QHS PRN Rathore, Vasundhra, MD   3 mg at 06/30/24 2049   multivitamin with minerals tablet 1 tablet  1 tablet Per Tube Daily Mavis Purchase, MD   1 tablet at 07/01/24 0935   mycophenolate  (CELLCEPT ) oral suspension 200 mg/mL  500 mg Per Tube BID Mavis Purchase, MD   500 mg at 07/01/24 9062   Oral care mouth rinse  15 mL Mouth Rinse 4 times per day Mavis Purchase, MD   15 mL at 07/01/24 9060   Oral care mouth rinse  15 mL Mouth Rinse PRN Mavis Purchase, MD       oxidized cellulose (Surgicel) pad 1 each  1 each Topical Once Bergman, Meghan D, NP       pantoprazole  (PROTONIX ) injection 40 mg  40 mg Intravenous QHS Mavis Purchase, MD   40 mg at 06/30/24 2049   phenol (CHLORASEPTIC) mouth spray 1 spray  1 spray Mouth/Throat PRN Macario Dorothyann HERO, MD   1 spray at 06/25/24 2139   predniSONE  (DELTASONE ) tablet 15 mg  15 mg Per Tube Q breakfast Mavis Purchase, MD   15 mg at 07/01/24 0935   sennosides (SENOKOT) 8.8 MG/5ML syrup 5 mL  5 mL Per Tube BID PRN Mavis Purchase, MD         Discharge Medications: Please see discharge summary for a list of discharge medications.  Relevant Imaging Results:  Relevant Lab  Results:   Additional Information SS#: [id number]; HD at [facility] on MWF 11:45 am chair time  [name] HERO [place], [name]

## 2024-07-01 NOTE — Progress Notes (Signed)
 [facility] Progress Note   Subjective:   Pt seen in room, Reportedly was complaining of abdominal pain earlier today. Her family at bedside reports she vomits when she coughs a lot. She denies SOB, CP, dizziness.   Objective Vitals:   07/01/24 0432 07/01/24 0600 07/01/24 0724 07/01/24 0933  BP: (!) 172/79 (!) 152/62 (!) 159/60 (!) 163/69  Pulse: 80 88 86 88  Resp: 18  16   Temp: 98.6 F (37 C)  98.9 F (37.2 C)   TempSrc: Oral  Oral   SpO2:  94% 98%   Weight:      Height:       Physical Exam  General: Alert female in NAD Heart: RRR, no murmurs, rubs or gallops Lungs: CTA bilaterally, respirations unlabored Abdomen: Soft, non-distended, +BS Extremities: No edema b/l lower extremities Dialysis Access:  RUE AVF +t/b  Additional Objective Labs: Basic Metabolic Panel: Recent Labs  Lab 06/27/24 0848 06/30/24 0901 06/30/24 1030  NA 129* 118* 124*  K 4.1 4.4 3.8  CL 87* 78* 85*  CO2 25 22 25   GLUCOSE 145* 121* 110*  BUN 73* 89* 56*  CREATININE 4.28* 5.24* 3.48*  CALCIUM  10.3 9.9 9.5  PHOS 5.0* 4.4 2.6   Liver Function Tests: Recent Labs  Lab 06/27/24 0848 06/30/24 0901 06/30/24 1030  ALBUMIN  3.3* 3.1* 3.1*   No results for input(s): LIPASE, AMYLASE in the last 168 hours. CBC: Recent Labs  Lab 06/25/24 1412 06/27/24 0849 06/30/24 0901  WBC 4.0 3.5* 4.6  HGB 8.6* 7.8* 7.9*  HCT 26.2* 24.3* 22.5*  MCV 94.2 98.0 90.4  PLT 120* 115* 125*   Blood Culture    Component Value Date/Time   SDES CSF 06/11/2024 0920   SPECREQUEST LP 06/11/2024 0920   CULT  06/11/2024 0920    NO GROWTH 3 DAYS Performed at [facility], [address] [place], [name] [address]    REPTSTATUS 06/14/2024 FINAL 06/11/2024 0920    Cardiac Enzymes: No results for input(s): CKTOTAL, CKMB, CKMBINDEX, TROPONINI in the last 168 hours. CBG: Recent Labs  Lab 06/30/24 2008 07/01/24 0017 07/01/24 0421 07/01/24 0723 07/01/24 1114  GLUCAP 120*  122* 132* 87 153*   Iron  Studies: No results for input(s): IRON , TIBC, TRANSFERRIN, FERRITIN in the last 72 hours. @lablastinr3 @ Studies/Results: No results found. Medications:  feeding supplement (NEPRO CARB STEADY) 1,000 mL (06/30/24 2044)    amLODipine   10 mg Oral Once per day on Sunday Tuesday Thursday Saturday   carvedilol   25 mg Per Tube 2 times per day on Sunday Tuesday Thursday Saturday   Chlorhexidine  Gluconate Cloth  6 each Topical Q0600   cinacalcet   30 mg Oral Q M,W,F   cloNIDine   0.3 mg Per Tube Once per day on Sunday Tuesday Thursday Saturday   cycloSPORINE   200 mg Per Tube QPM   cycloSPORINE   225 mg Per Tube Daily   darbepoetin (ARANESP ) injection - DIALYSIS  100 mcg Subcutaneous Q Thu-1800   fiber  1 packet Per Tube TID   gabapentin   100 mg Oral Daily   Gerhardt's butt cream   Topical TID   insulin  aspart  0-9 Units Subcutaneous Q4H   lacosamide   100 mg Per Tube BID   multivitamin with minerals  1 tablet Per Tube Daily   mycophenolate   500 mg Per Tube BID   mouth rinse  15 mL Mouth Rinse 4 times per day   oxidized cellulose  1 each Topical Once   pantoprazole  (PROTONIX ) IV  40 mg Intravenous QHS   predniSONE   15 mg Per Tube Q breakfast    Assessment/Plan: 1.  Acute right intracranial hemorrhage: Associated with uncontrolled hypertension and status post conversion from EVD to VP shunt on 06/18/2024 by neurosurgery.  Mental status waxing/waning and she continues to require total assistance for PT/OT.  Awaiting acceptance to LTAC after failure to meet CIR criteria. 2. ESRD following failure of renal allograft: Continue hemodialysis on Monday/Wednesday/Friday. 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  3. Anemia: 7/9- slowly drifting down.  She remains on Aranesp  100 mcg weekly- increased dose today. 4. CKD-MBD: With marginally  elevated calcium  that is suspected to be largely from immobility.  Status post pamidronate on 06/15/2024 and started on Cinacalcet  30 mg 3 days a week. 5. Nutrition: Currently on tube feeds with Nepro along with renal MVI 6. Hypertension: Intermittently elevated blood pressures noted on the current regimen, amlodipine  started recently and UF with HD, see below. 7. Hyponatremia: Repeat Na yesterday was better at 124 but still low. Increasing her UF goals this week   Lucie Collet, PA-C 07/01/2024, 11:57 AM  [facility] Pager: [phone]

## 2024-07-01 NOTE — Plan of Care (Signed)
  Problem: Education: Goal: Ability to describe self-care measures that may prevent or decrease complications (Diabetes Survival Skills Education) will improve 07/01/2024 0524 by Jessy Calixte K, RN Outcome: Progressing 06/30/2024 2324 by Bladimir Auman K, RN Outcome: Progressing Goal: Individualized Educational Video(s) 07/01/2024 0524 by Markey Deady K, RN Outcome: Progressing 06/30/2024 2324 by Antwanette Wesche K, RN Outcome: Progressing   Problem: Coping: Goal: Ability to adjust to condition or change in health will improve 07/01/2024 0524 by Malaya Cagley K, RN Outcome: Progressing 06/30/2024 2324 by Buckley Bradly K, RN Outcome: Progressing

## 2024-07-01 NOTE — Plan of Care (Signed)
  Problem: Education: Goal: Ability to describe self-care measures that may prevent or decrease complications (Diabetes Survival Skills Education) will improve Outcome: Progressing Goal: Individualized Educational Video(s) Outcome: Progressing   Problem: Coping: Goal: Ability to adjust to condition or change in health will improve Outcome: Progressing   Problem: Fluid Volume: Goal: Ability to maintain a balanced intake and output will improve Outcome: Progressing   Problem: Health Behavior/Discharge Planning: Goal: Ability to identify and utilize available resources and services will improve Outcome: Progressing Goal: Ability to manage health-related needs will improve Outcome: Progressing   Problem: Metabolic: Goal: Ability to maintain appropriate glucose levels will improve Outcome: Progressing   Problem: Nutritional: Goal: Maintenance of adequate nutrition will improve Outcome: Progressing Goal: Progress toward achieving an optimal weight will improve Outcome: Progressing   Problem: Skin Integrity: Goal: Risk for impaired skin integrity will decrease Outcome: Progressing   Problem: Tissue Perfusion: Goal: Adequacy of tissue perfusion will improve Outcome: Progressing   Problem: Education: Goal: Knowledge of disease or condition will improve Outcome: Progressing Goal: Knowledge of secondary prevention will improve (MUST DOCUMENT ALL) Outcome: Progressing Goal: Knowledge of patient specific risk factors will improve (DELETE if not current risk factor) Outcome: Progressing   Problem: Intracerebral Hemorrhage Tissue Perfusion: Goal: Complications of Intracerebral Hemorrhage will be minimized Outcome: Progressing   Problem: Coping: Goal: Will verbalize positive feelings about self Outcome: Progressing Goal: Will identify appropriate support needs Outcome: Progressing   Problem: Health Behavior/Discharge Planning: Goal: Ability to manage health-related needs will  improve Outcome: Progressing Goal: Goals will be collaboratively established with patient/family Outcome: Progressing   Problem: Self-Care: Goal: Ability to participate in self-care as condition permits will improve Outcome: Progressing Goal: Verbalization of feelings and concerns over difficulty with self-care will improve Outcome: Progressing Goal: Ability to communicate needs accurately will improve Outcome: Progressing   Problem: Nutrition: Goal: Risk of aspiration will decrease Outcome: Progressing Goal: Dietary intake will improve Outcome: Progressing   Problem: Education: Goal: Knowledge of General Education information will improve Description: Including pain rating scale, medication(s)/side effects and non-pharmacologic comfort measures Outcome: Progressing   Problem: Health Behavior/Discharge Planning: Goal: Ability to manage health-related needs will improve Outcome: Progressing   Problem: Clinical Measurements: Goal: Ability to maintain clinical measurements within normal limits will improve Outcome: Progressing Goal: Will remain free from infection Outcome: Progressing Goal: Diagnostic test results will improve Outcome: Progressing Goal: Respiratory complications will improve Outcome: Progressing Goal: Cardiovascular complication will be avoided Outcome: Progressing   Problem: Activity: Goal: Risk for activity intolerance will decrease Outcome: Progressing   Problem: Nutrition: Goal: Adequate nutrition will be maintained Outcome: Progressing   Problem: Coping: Goal: Level of anxiety will decrease Outcome: Progressing   Problem: Elimination: Goal: Will not experience complications related to bowel motility Outcome: Progressing   Problem: Pain Managment: Goal: General experience of comfort will improve and/or be controlled Outcome: Progressing   Problem: Safety: Goal: Ability to remain free from injury will improve Outcome: Progressing    Problem: Skin Integrity: Goal: Risk for impaired skin integrity will decrease Outcome: Progressing   Problem: Education: Goal: Knowledge of the prescribed therapeutic regimen will improve Outcome: Progressing   Problem: Clinical Measurements: Goal: Usual level of consciousness will be regained or maintained. Outcome: Progressing Goal: Neurologic status will improve Outcome: Progressing Goal: Ability to maintain intracranial pressure will improve Outcome: Progressing   Problem: Skin Integrity: Goal: Demonstration of wound healing without infection will improve Outcome: Progressing

## 2024-07-02 DIAGNOSIS — I619 Nontraumatic intracerebral hemorrhage, unspecified: Secondary | ICD-10-CM | POA: Diagnosis not present

## 2024-07-02 LAB — RENAL FUNCTION PANEL
Albumin: 2.9 g/dL — ABNORMAL LOW (ref 3.5–5.0)
Anion gap: 15 (ref 5–15)
BUN: 65 mg/dL — ABNORMAL HIGH (ref 6–20)
CO2: 26 mmol/L (ref 22–32)
Calcium: 10.1 mg/dL (ref 8.9–10.3)
Chloride: 84 mmol/L — ABNORMAL LOW (ref 98–111)
Creatinine, Ser: 4.56 mg/dL — ABNORMAL HIGH (ref 0.44–1.00)
GFR, Estimated: 12 mL/min — ABNORMAL LOW (ref >60)
Glucose, Bld: 134 mg/dL — ABNORMAL HIGH (ref 70–99)
Phosphorus: 4.3 mg/dL (ref 2.5–4.6)
Potassium: 3.9 mmol/L (ref 3.5–5.1)
Sodium: 125 mmol/L — ABNORMAL LOW (ref 135–145)

## 2024-07-02 LAB — GLUCOSE, CAPILLARY
Glucose-Capillary: 116 mg/dL — ABNORMAL HIGH (ref 70–99)
Glucose-Capillary: 130 mg/dL — ABNORMAL HIGH (ref 70–99)
Glucose-Capillary: 151 mg/dL — ABNORMAL HIGH (ref 70–99)
Glucose-Capillary: 152 mg/dL — ABNORMAL HIGH (ref 70–99)
Glucose-Capillary: 161 mg/dL — ABNORMAL HIGH (ref 70–99)
Glucose-Capillary: 174 mg/dL — ABNORMAL HIGH (ref 70–99)

## 2024-07-02 LAB — CBC
HCT: 22.5 % — ABNORMAL LOW (ref 36.0–46.0)
Hemoglobin: 7.6 g/dL — ABNORMAL LOW (ref 12.0–15.0)
MCH: 31.5 pg (ref 26.0–34.0)
MCHC: 33.8 g/dL (ref 30.0–36.0)
MCV: 93.4 fL (ref 80.0–100.0)
Platelets: 144 K/uL — ABNORMAL LOW (ref 150–400)
RBC: 2.41 MIL/uL — ABNORMAL LOW (ref 3.87–5.11)
RDW: 14.6 % (ref 11.5–15.5)
WBC: 5.1 K/uL (ref 4.0–10.5)
nRBC: 0 % (ref 0.0–0.2)

## 2024-07-02 MED ORDER — DIPHENHYDRAMINE HCL 25 MG PO CAPS: 25.0000 mg | ORAL_CAPSULE | Freq: Four times a day (QID) | ORAL | Status: DC | PRN

## 2024-07-02 MED ORDER — DIPHENHYDRAMINE HCL 25 MG PO CAPS
25.0000 mg | ORAL_CAPSULE | Freq: Four times a day (QID) | ORAL | Status: DC | PRN
  Administered 2024-07-02 – 2024-08-04 (×9): 25 mg
  Filled 2024-07-02 (×11): qty 1

## 2024-07-02 NOTE — Progress Notes (Signed)
 Received patient in bed to unit.  Alert and oriented.  Informed consent signed and in chart.   TX duration:3.5 hours.  Patient kept moving access arm, so per dialysis protocal applied soft restraint and taken off at end of shift.  Patient tolerated well.  Transported back to the room  Alert, without acute distress.  Hand-off given to patient's nurse.   Access used: Left Upper Arm fistula Access issues: none  Total UF removed: 3L   Camellia Brasil LPN [facility] D[facility] Unit   07/02/24 1322  Vitals  Temp (!) 97.5 F (36.4 C)  Temp Source Oral  BP 128/84  Pulse Rate 90  ECG Heart Rate 91  Resp 20  Oxygen  Therapy  SpO2 98 %  O2 Device Room Air  During Treatment Monitoring  Duration of HD Treatment -hour(s) 3.5 hour(s)  HD Safety Checks Performed Yes  Intra-Hemodialysis Comments Tx completed  Post Treatment  Dialyzer Clearance Lightly streaked  Liters Processed 84  Fluid Removed (mL) 3000 mL  Fistula / Graft Right Upper arm Arteriovenous fistula  Placement Date/Time: (c) 12/08/20 1403   Placed prior to admission: Yes  Orientation: Right  Access Location: Upper arm  Access Type: Arteriovenous fistula  Site Condition No complications  Fistula / Graft Assessment Present;Thrill;Bruit  Status Deaccessed;Patent  Drainage Description None

## 2024-07-02 NOTE — Plan of Care (Signed)
  Problem: Education: Goal: Ability to describe self-care measures that may prevent or decrease complications (Diabetes Survival Skills Education) will improve Outcome: Progressing Goal: Individualized Educational Video(s) Outcome: Progressing   Problem: Coping: Goal: Ability to adjust to condition or change in health will improve Outcome: Progressing   Problem: Fluid Volume: Goal: Ability to maintain a balanced intake and output will improve Outcome: Progressing   Problem: Health Behavior/Discharge Planning: Goal: Ability to identify and utilize available resources and services will improve Outcome: Progressing Goal: Ability to manage health-related needs will improve Outcome: Progressing   Problem: Metabolic: Goal: Ability to maintain appropriate glucose levels will improve Outcome: Progressing   Problem: Nutritional: Goal: Maintenance of adequate nutrition will improve Outcome: Progressing Goal: Progress toward achieving an optimal weight will improve Outcome: Progressing   Problem: Skin Integrity: Goal: Risk for impaired skin integrity will decrease Outcome: Progressing   Problem: Tissue Perfusion: Goal: Adequacy of tissue perfusion will improve Outcome: Progressing   Problem: Education: Goal: Knowledge of disease or condition will improve Outcome: Progressing Goal: Knowledge of secondary prevention will improve (MUST DOCUMENT ALL) Outcome: Progressing Goal: Knowledge of patient specific risk factors will improve (DELETE if not current risk factor) Outcome: Progressing   Problem: Intracerebral Hemorrhage Tissue Perfusion: Goal: Complications of Intracerebral Hemorrhage will be minimized Outcome: Progressing   Problem: Coping: Goal: Will verbalize positive feelings about self Outcome: Progressing Goal: Will identify appropriate support needs Outcome: Progressing   Problem: Health Behavior/Discharge Planning: Goal: Ability to manage health-related needs will  improve Outcome: Progressing Goal: Goals will be collaboratively established with patient/family Outcome: Progressing   Problem: Self-Care: Goal: Ability to participate in self-care as condition permits will improve Outcome: Progressing Goal: Verbalization of feelings and concerns over difficulty with self-care will improve Outcome: Progressing Goal: Ability to communicate needs accurately will improve Outcome: Progressing   Problem: Nutrition: Goal: Risk of aspiration will decrease Outcome: Progressing Goal: Dietary intake will improve Outcome: Progressing   Problem: Education: Goal: Knowledge of General Education information will improve Description: Including pain rating scale, medication(s)/side effects and non-pharmacologic comfort measures Outcome: Progressing   Problem: Health Behavior/Discharge Planning: Goal: Ability to manage health-related needs will improve Outcome: Progressing   Problem: Clinical Measurements: Goal: Ability to maintain clinical measurements within normal limits will improve Outcome: Progressing Goal: Will remain free from infection Outcome: Progressing Goal: Diagnostic test results will improve Outcome: Progressing Goal: Respiratory complications will improve Outcome: Progressing Goal: Cardiovascular complication will be avoided Outcome: Progressing   Problem: Activity: Goal: Risk for activity intolerance will decrease Outcome: Progressing   Problem: Nutrition: Goal: Adequate nutrition will be maintained Outcome: Progressing   Problem: Coping: Goal: Level of anxiety will decrease Outcome: Progressing   Problem: Elimination: Goal: Will not experience complications related to bowel motility Outcome: Progressing   Problem: Pain Managment: Goal: General experience of comfort will improve and/or be controlled Outcome: Progressing   Problem: Safety: Goal: Ability to remain free from injury will improve Outcome: Progressing    Problem: Skin Integrity: Goal: Risk for impaired skin integrity will decrease Outcome: Progressing   Problem: Education: Goal: Knowledge of the prescribed therapeutic regimen will improve Outcome: Progressing   Problem: Clinical Measurements: Goal: Usual level of consciousness will be regained or maintained. Outcome: Progressing Goal: Neurologic status will improve Outcome: Progressing Goal: Ability to maintain intracranial pressure will improve Outcome: Progressing   Problem: Skin Integrity: Goal: Demonstration of wound healing without infection will improve Outcome: Progressing

## 2024-07-02 NOTE — Progress Notes (Signed)
 PROGRESS NOTE    [name] [name]  FMW:[id number] DOB: Jul 08, 1981 DOA: 05/19/2024 PCP: [facility]   Brief Narrative:  [name] with PMH HTN, ischemic and hemorrhagic CVA, type 1 diabetes, gastroparesis, kidney and pancreas transplant 2014, subsequent failure of renal transplant, now ESRD on dialysis, anxiety, narcotic abuse. Presented to the [facility] ED from dialysis as a code stroke on 9/15; subsequently found to have hemorrhagic stroke. Neurology, neurosurgery, PCCM consulted. EVD was placed on the left side. Patient admitted to neuro ICU. She was intubated on admission. She had prolonged ICU stay with EVD drain which was changed to VP shunt on 10/15.  Extubated on 9/23. Transferred to [facility] service on 10/17.     She was recently discharged from here in August after she was managed for ischemic/hemorrhagic stroke and was discharged to LTAC.    Important events:  9/15 admitted with ICH plus IVH, EVD was placed for hydrocephalus, intubated 9/22 palliative care meeting.  Family is insistent that they want full scope of care 9/23 Extubated 10/1 -developed fevers pancultures drawn and broad-spectrum antibiotics started 10/2 became afebrile after starting antibiotics, did not tolerate raising EVD 10/6 EVD was raised from 10 to 30 cm water , tolerating well, remained afebrile 10/7 EVD is at 30 cm water , CT scan will be done tomorrow morning, no overnight issues 10/8 she became somnolent, CT head showed increasing hydrocephalus, EVD was titrated down to 10 cm of water  with improvement in mental status 10/15 VP shunt placement 10/17 transferred to progressive unit with [name]   Assessment & Plan:   Principal Problem:   Hemorrhagic stroke [facility]) Active Problems:   Moderate protein-calorie malnutrition   Pressure injury of skin   Nontraumatic subcortical hemorrhage of right cerebral hemisphere [facility])  Acute right parietotemporal intraparenchymal  hemorrhage/intraventricular hemorrhage Uncontrolled hypertension Obstructive hydrocephalus s/p EVD changed to VP shunt Seizure disorder s/p VP shunt placement 06/18/2024 by Dr. Mavis. Remains stable on Vimpat  100 mg twice daily. Neurosurgery following. Per them, no issue after the shunt.   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 Patient on CellCept  and prednisone . Dialyzed on MWF schedule. Has AV fistula on the right upper extremity.   Acute bronchitis Resolved.    Intermittent low-grade fever: Thought to be secondary to intracranial hemorrhage. Previous blood cultures showed contamination. S/p antibiotics for respiratory cultures positive for Serratia and Klebsiella. Repeat cultures negative thus far. Patient afebrile for several days.   History of pancreatic transplantation: On cyclosporine , mycophenolate , prednisone    Hypertension:  Patient currently on Coreg , amlodipine  and clonidine  on nondialysis days.  Patient's blood pressure much better controlled.   Anemia of chronic disease: No signs of active bleeding. Hemoglobin stable   Type 1 diabetes with vasculopathy/gastroparesis: Not on long-term insulin  anymore due to pancreatic transplant. Monitor blood sugars. On sliding scale.   Moderate protein calorie malnutrition/dysphagia: Has PEG tube. Continue tube feeds.  SLP and dietitian following.    Stage I sacral pressure ulcer present on admission: Continue wound care   Debility/deconditioning/disposition/goals of care: Young patient with multiple comorbidities.  Palliative care consulted for goals of care conversations, family insist she remains full code.  Palliative has signed off 10/17.  Seen by PT OT.  They recommended CIR.  Reviewed by CIR, they recommend LTAC.  Peer to peer with insurance company was done by myself on 06/25/2024, and they denied LTAC.  They recommended LTC.  Appeal for LTAC denied again.  TOC  working on placing her  to LTC.  Diarrhea: Continue Imodium  as needed.  Resolved.  DVT prophylaxis: SCDs Start: 05/19/24 1724   Code Status: Full Code  Family Communication: Discussed plan with mother at bedside.  Status is: Inpatient Remains inpatient appropriate because: Medically stable, pending placement to LTC,    Estimated body mass index is 22.14 kg/m as calculated from the following:   Height as of this encounter: 5' 3 (1.6 m).   Weight as of this encounter: 56.7 kg.  Wound 05/19/24 1933 Pressure Injury Buttocks Bilateral Stage 1 -  Intact skin with non-blanchable redness of a localized area usually over a bony prominence. (Active)   Nutritional Assessment: Body mass index is 22.14 kg/m.[name] Seen by dietician.  I agree with the assessment and plan as outlined below: Nutrition Status: Nutrition Problem: Moderate Malnutrition Etiology: chronic illness (ESRD on HD/DM and gastroparesis) Signs/Symptoms: severe muscle depletion, mild fat depletion Interventions: Refer to RD note for recommendations  . Skin Assessment: I have examined the patient's skin and I agree with the wound assessment as performed by the wound care RN as outlined below: Wound 05/19/24 1933 Pressure Injury Buttocks Bilateral Stage 1 -  Intact skin with non-blanchable redness of a localized area usually over a bony prominence. (Active)    Consultants:  General surgery, palliative care, nephrology, neurology  Procedures:  As above  Antimicrobials:  Anti-infectives (From admission, onward)    Start     Dose/Rate Route Frequency Ordered Stop   06/18/24 2030  ceFAZolin  (ANCEF ) IVPB 2g/100 mL premix  Status:  Discontinued        2 g 200 mL/hr over 30 Minutes Intravenous Every 8 hours 06/18/24 1454 06/19/24 1608   06/18/24 0600  ceFAZolin  (ANCEF ) IVPB 2g/100 mL premix        2 g 200 mL/hr over 30 Minutes Intravenous On call to O.R. 06/15/24 1152 06/18/24 1230   06/06/24 1400  piperacillin -tazobactam  (ZOSYN ) IVPB 2.25 g        2.25 g 100 mL/hr over 30 Minutes Intravenous Every 8 hours 06/06/24 1017 06/09/24 0538   06/06/24 1200  vancomycin  (VANCOREADY) IVPB 500 mg/100 mL        500 mg 100 mL/hr over 60 Minutes Intravenous Every M-W-F (Hemodialysis) 06/04/24 1745 06/06/24 1900   06/04/24 1845  vancomycin  (VANCOCIN ) IVPB 1000 mg/200 mL premix        1,000 mg 200 mL/hr over 60 Minutes Intravenous  Once 06/04/24 1745 06/04/24 1915   06/04/24 1830  ceFEPIme  (MAXIPIME ) 2 g in sodium chloride  0.9 % 100 mL IVPB  Status:  Discontinued        2 g 200 mL/hr over 30 Minutes Intravenous Every M-W-F (Hemodialysis) 06/04/24 1743 06/06/24 1017   05/27/24 1015  cefTRIAXone  (ROCEPHIN ) 2 g in sodium chloride  0.9 % 100 mL IVPB        2 g 200 mL/hr over 30 Minutes Intravenous Every 24 hours 05/27/24 0918 05/29/24 1014   05/23/24 1530  ceFEPIme  (MAXIPIME ) 1 g in sodium chloride  0.9 % 100 mL IVPB  Status:  Discontinued        1 g 200 mL/hr over 30 Minutes Intravenous Every 24 hours 05/23/24 1433 05/27/24 0918   05/22/24 2200  Ampicillin -Sulbactam (UNASYN ) 3 g in sodium chloride  0.9 % 100 mL IVPB  Status:  Discontinued        3 g 200 mL/hr over 30 Minutes Intravenous Every 24 hours 05/22/24 0834 05/23/24 1433   05/22/24 0930  Ampicillin -Sulbactam (UNASYN ) 3 g in sodium chloride  0.9 %  100 mL IVPB        3 g 200 mL/hr over 30 Minutes Intravenous  Once 05/22/24 0834 05/22/24 1900         Subjective: Met with the mother at the bedside.  She had no complaints.  Saw patient at dialysis.  She was slightly lethargic.  Appears comfortable.  Objective: Vitals:   07/02/24 0001 07/02/24 0414 07/02/24 0500 07/02/24 0755  BP: (!) 151/57 (!) 163/65  (!) 149/69  Pulse: 75 78  85  Resp: 19 19  20   Temp: 98 F (36.7 C) 97.7 F (36.5 C)  98.6 F (37 C)  TempSrc: Axillary Oral  Axillary  SpO2: 95% 95%  96%  Weight:   56.7 kg   Height:        Intake/Output Summary (Last 24 hours) at 07/02/2024 0824 Last data  filed at 07/02/2024 0556 Gross per 24 hour  Intake 855 ml  Output 0 ml  Net 855 ml    Filed Weights   06/30/24 0944 06/30/24 1351 07/02/24 0500  Weight: 56.4 kg 55.1 kg 56.7 kg     Examination: General: Chronically ill middle-age woman appears in no acute distress. CV: RRR. No murmurs, rubs, or gallops. No LE edema Pulmonary: Anterior lung sounds clear to auscultation. Normal effort. Abdominal: Soft, nontender, nondistended. Normal bowel sounds. Extremities: RUE AVF with palpable thrill. Palpable pedal pulses. Skin: Warm and dry. No obvious rash or lesions. Neuro: Awake.  Moves all extremities. Normal sensation to light touch.    Data Reviewed: I have personally reviewed following labs and imaging studies  CBC: Recent Labs  Lab 06/25/24 1412 06/27/24 0849 06/30/24 0901  WBC 4.0 3.5* 4.6  HGB 8.6* 7.8* 7.9*  HCT 26.2* 24.3* 22.5*  MCV 94.2 98.0 90.4  PLT 120* 115* 125*   Basic Metabolic Panel: Recent Labs  Lab 06/25/24 1412 06/27/24 0848 06/30/24 0901 06/30/24 1030  NA 127* 129* 118* 124*  K 5.0 4.1 4.4 3.8  CL 88* 87* 78* 85*  CO2 25 25 22 25   GLUCOSE 146* 145* 121* 110*  BUN 86* 73* 89* 56*  CREATININE 4.84* 4.28* 5.24* 3.48*  CALCIUM  10.0 10.3 9.9 9.5  PHOS 4.6 5.0* 4.4 2.6   GFR: Estimated Creatinine Clearance: 17.4 mL/min (A) (by C-G formula based on SCr of 3.48 mg/dL (H)). Liver Function Tests: Recent Labs  Lab 06/25/24 1412 06/27/24 0848 06/30/24 0901 06/30/24 1030  ALBUMIN  3.4* 3.3* 3.1* 3.1*   No results for input(s): LIPASE, AMYLASE in the last 168 hours. No results for input(s): AMMONIA in the last 168 hours. Coagulation Profile: No results for input(s): INR, PROTIME in the last 168 hours.  Cardiac Enzymes: No results for input(s): CKTOTAL, CKMB, CKMBINDEX, TROPONINI in the last 168 hours. BNP (last 3 results) No results for input(s): PROBNP in the last 8760 hours. HbA1C: No results for input(s): HGBA1C in the  last 72 hours. CBG: Recent Labs  Lab 07/01/24 1521 07/01/24 1959 07/02/24 0003 07/02/24 0320 07/02/24 0754  GLUCAP 194* 122* 130* 116* 152*   Lipid Profile: No results for input(s): CHOL, HDL, LDLCALC, TRIG, CHOLHDL, LDLDIRECT in the last 72 hours. Thyroid Function Tests: No results for input(s): TSH, T4TOTAL, FREET4, T3FREE, THYROIDAB in the last 72 hours. Anemia Panel: No results for input(s): VITAMINB12, FOLATE, FERRITIN, TIBC, IRON , RETICCTPCT in the last 72 hours. Sepsis Labs: No results for input(s): PROCALCITON, LATICACIDVEN in the last 168 hours.  No results found for this or any previous visit (from the past 240 hours).  Radiology Studies: No results found.   Scheduled Meds:  amLODipine   10 mg Oral Once per day on Sunday Tuesday Thursday Saturday   carvedilol   25 mg Per Tube 2 times per day on Sunday Tuesday Thursday Saturday   Chlorhexidine  Gluconate Cloth  6 each Topical Q0600   cinacalcet   30 mg Oral Q M,W,F   cloNIDine   0.3 mg Per Tube Once per day on Sunday Tuesday Thursday Saturday   cycloSPORINE   200 mg Per Tube QPM   cycloSPORINE   225 mg Per Tube Daily   [START ON 07/03/2024] darbepoetin (ARANESP ) injection - DIALYSIS  150 mcg Subcutaneous Q Thu-1800   fiber  1 packet Per Tube TID   gabapentin   100 mg Oral Daily   Gerhardt's butt cream   Topical TID   insulin  aspart  0-9 Units Subcutaneous Q4H   lacosamide   100 mg Per Tube BID   multivitamin with minerals  1 tablet Per Tube Daily   mycophenolate   500 mg Per Tube BID   mouth rinse  15 mL Mouth Rinse 4 times per day   oxidized cellulose  1 each Topical Once   pantoprazole  (PROTONIX ) IV  40 mg Intravenous QHS   predniSONE   15 mg Per Tube Q breakfast   Continuous Infusions:  feeding supplement (NEPRO CARB STEADY) 1,000 mL (07/01/24 2155)     LOS: 44 days   Fredia Skeeter, MD [name]  07/02/2024, 8:24 AM   *Please note that this is a verbal dictation  therefore any spelling or grammatical errors are due to the Dragon Medical One system interpretation.  Please page via Amion and do not message via secure chat for urgent patient care matters. Secure chat can be used for non urgent patient care matters.  How to contact the [facility] Attending or Consulting provider 7A - 7P or covering provider during after hours 7P -7A, for this patient?  Check the care team in [facility] and look for a) attending/consulting [facility] provider listed and b) the [facility] team listed. Page or secure chat 7A-7P. Log into [email or web address] and use [place]'s universal password to access. If you do not have the password, please contact the hospital operator. Locate the [facility] provider you are looking for under [name] and page to a number that you can be directly reached. If you still have difficulty reaching the provider, please page the [facility] (Director on Call) for the Hospitalists listed on amion for assistance.

## 2024-07-02 NOTE — Progress Notes (Signed)
 [facility] night cross cover note:  Prn benadryl  added for generalized pruritus, without report of associated rash.     Eva Pore, DO Hospitalist

## 2024-07-02 NOTE — Progress Notes (Signed)
 [facility] Progress Note   Subjective:   Seen on HD. Alert, denies SOB, CP, dizziness.   Objective Vitals:   07/02/24 1100 07/02/24 1115 07/02/24 1130 07/02/24 1200  BP: (!) 121/94 (!) 121/94 135/78 122/61  Pulse: 84 84 88 88  Resp: 15 19 18  (!) 27  Temp:      TempSrc:      SpO2: 99% 97% 98% 91%  Weight:      Height:       Physical Exam General: Alert female in NAD Heart: RRR, no murmurs, rubs or gallops Lungs: CTA bilaterally, respirations unlabored Abdomen: Soft, non-distended, +BS Extremities: No edema b/l lower extremities Dialysis Access:  RUE AVF +t/b  Additional Objective Labs: Basic Metabolic Panel: Recent Labs  Lab 06/30/24 0901 06/30/24 1030 07/02/24 0842  NA 118* 124* 125*  K 4.4 3.8 3.9  CL 78* 85* 84*  CO2 22 25 26   GLUCOSE 121* 110* 134*  BUN 89* 56* 65*  CREATININE 5.24* 3.48* 4.56*  CALCIUM  9.9 9.5 10.1  PHOS 4.4 2.6 4.3   Liver Function Tests: Recent Labs  Lab 06/30/24 0901 06/30/24 1030 07/02/24 0842  ALBUMIN  3.1* 3.1* 2.9*   No results for input(s): LIPASE, AMYLASE in the last 168 hours. CBC: Recent Labs  Lab 06/25/24 1412 06/27/24 0849 06/30/24 0901 07/02/24 0842  WBC 4.0 3.5* 4.6 5.1  HGB 8.6* 7.8* 7.9* 7.6*  HCT 26.2* 24.3* 22.5* 22.5*  MCV 94.2 98.0 90.4 93.4  PLT 120* 115* 125* 144*   Blood Culture    Component Value Date/Time   SDES CSF 06/11/2024 0920   SPECREQUEST LP 06/11/2024 0920   CULT  06/11/2024 0920    NO GROWTH 3 DAYS Performed at [facility], [address], [place], [name] [address]    REPTSTATUS 06/14/2024 FINAL 06/11/2024 0920    Cardiac Enzymes: No results for input(s): CKTOTAL, CKMB, CKMBINDEX, TROPONINI in the last 168 hours. CBG: Recent Labs  Lab 07/01/24 1521 07/01/24 1959 07/02/24 0003 07/02/24 0320 07/02/24 0754  GLUCAP 194* 122* 130* 116* 152*   Iron  Studies: No results for input(s): IRON , TIBC, TRANSFERRIN, FERRITIN in the last 72  hours. @lablastinr3 @ Studies/Results: No results found. Medications:  feeding supplement (NEPRO CARB STEADY) 1,000 mL (07/01/24 2155)    amLODipine   10 mg Oral Once per day on Sunday Tuesday Thursday Saturday   carvedilol   25 mg Per Tube 2 times per day on Sunday Tuesday Thursday Saturday   Chlorhexidine  Gluconate Cloth  6 each Topical Q0600   cinacalcet   30 mg Oral Q M,W,F   cloNIDine   0.3 mg Per Tube Once per day on Sunday Tuesday Thursday Saturday   cycloSPORINE   200 mg Per Tube QPM   cycloSPORINE   225 mg Per Tube Daily   [START ON 07/03/2024] darbepoetin (ARANESP ) injection - DIALYSIS  150 mcg Subcutaneous Q Thu-1800   fiber  1 packet Per Tube TID   gabapentin   100 mg Oral Daily   Gerhardt's butt cream   Topical TID   insulin  aspart  0-9 Units Subcutaneous Q4H   lacosamide   100 mg Per Tube BID   multivitamin with minerals  1 tablet Per Tube Daily   mycophenolate   500 mg Per Tube BID   mouth rinse  15 mL Mouth Rinse 4 times per day   oxidized cellulose  1 each Topical Once   pantoprazole  (PROTONIX ) IV  40 mg Intravenous QHS   predniSONE   15 mg Per Tube Q breakfast   Assessment/Plan: 1.  Acute right intracranial hemorrhage: Associated with uncontrolled hypertension and status post conversion from EVD to VP shunt on 06/18/2024 by neurosurgery.  Mental status waxing/waning and she continues to require total assistance for PT/OT.  Awaiting acceptance to LTAC after failure to meet CIR criteria. 2. ESRD following failure of renal allograft: Continue hemodialysis on Monday/Wednesday/Friday. 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  3. Anemia: 7/9- slowly drifting down.  She remains on Aranesp  100 mcg weekly- increased dose  4. CKD-MBD: With marginally elevated calcium  that is suspected to be largely from immobility.  Status post pamidronate on 06/15/2024 and  started on Cinacalcet  30 mg 3 days a week. 5. Nutrition: Currently on tube feeds with Nepro along with renal MVI 6. Hypertension: Intermittently elevated blood pressures noted on the current regimen, amlodipine  started recently and UF with HD, see below. 7. Hyponatremia: Repeat Na yesterday was better but still low. Increasing her UF goals this week     Lucie Collet, PA-C 07/02/2024, 12:35 PM  [facility] Pager: [phone]

## 2024-07-02 NOTE — TOC Progression Note (Signed)
 Transition of Care [facility]) - Progression Note    Patient Details  Name: [name] [name] MRN: [id number] Date of Birth: [birth year]  [name] CM/SW Contact  [name], RN Phone Number: 07/02/2024, 2:53 PM  Clinical Narrative:     [facility] offered a bed and patients mom accepted but then the facility retracted the offer. Mom is looking to see what other SNF she would want for her daughter.  IP Care management following.  Expected Discharge Plan: Long Term Acute Care (LTAC) Barriers to Discharge: Continued Medical Work up, Air Traffic Controller and Services In-house Referral: Clinical Social Work   Post Acute Care Choice: Skilled Nursing Facility Living arrangements for the past 2 months: Apartment                                       Social Drivers of Health (SDOH) Interventions SDOH Screenings   Food Insecurity: Patient Unable To Answer (05/20/2024)  Housing: Patient Unable To Answer (05/20/2024)  Transportation Needs: Patient Unable To Answer (05/20/2024)  Utilities: Patient Unable To Answer (05/20/2024)  Social Connections: Unknown (10/04/2022)   Received from [facility]  Stress: No Stress Concern Present (04/16/2024)   Received from [facility]  Tobacco Use: Medium Risk (06/18/2024)    Readmission Risk Interventions    05/20/2024    4:16 PM 03/31/2024   11:27 AM 11/17/2021   12:15 PM  Readmission Risk Prevention Plan  Transportation Screening Complete Complete Complete  Medication Review Oceanographer) Complete Complete Complete  PCP or Specialist appointment within 3-5 days of discharge Complete  Complete  HRI or Home Care Consult Complete Complete Complete  SW Recovery Care/Counseling Consult Complete Complete Complete  Palliative Care Screening Complete Not Applicable Not Applicable  Skilled Nursing Facility Complete Not Applicable Patient Refused

## 2024-07-03 DIAGNOSIS — I619 Nontraumatic intracerebral hemorrhage, unspecified: Secondary | ICD-10-CM | POA: Diagnosis not present

## 2024-07-03 LAB — GLUCOSE, CAPILLARY
Glucose-Capillary: 122 mg/dL — ABNORMAL HIGH (ref 70–99)
Glucose-Capillary: 122 mg/dL — ABNORMAL HIGH (ref 70–99)
Glucose-Capillary: 138 mg/dL — ABNORMAL HIGH (ref 70–99)
Glucose-Capillary: 211 mg/dL — ABNORMAL HIGH (ref 70–99)
Glucose-Capillary: 153 mg/dL — ABNORMAL HIGH (ref 70–99)

## 2024-07-03 MED ORDER — CHLORHEXIDINE GLUCONATE CLOTH 2 % EX PADS
6.0000 | MEDICATED_PAD | Freq: Every day | CUTANEOUS | Status: DC
Start: 2024-07-04 — End: 2024-07-06
  Administered 2024-07-04: 6 via TOPICAL

## 2024-07-03 NOTE — Progress Notes (Signed)
 Physical Therapy Treatment Patient Details Name: [name] [name] MRN: [id number] DOB: 1981/06/26 Today's Date: 07/03/2024   History of Present Illness 43 yo female presents 05/19/24 for left gaze and L side weakness. 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 Intubated 9/15-9/23. EVD 9/15- 10/15; s/p shunt 10/15. S/p laparoscopy with intra-abdominal assistance ventricular-peritoneal shunt placement 10/15. 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 Pt appears more alert and demonstrates improved initiation this session. Pt limited by quick fatigue requiring encouragement to continue participating. Pt demonstrates poor sitting balance with 1 overt LOB. Pt able to stand and pivot to the recliner with max A +2. Pt requires assist to reposition in the recliner, however pt tends to scoot bottom forward despite cues. RN and pt's mother present at end of session and mother instructed to call nursing if pt continues to scoot further forward. Pt continues to benefit from PT services to progress toward functional mobility goals.     If plan is discharge home, recommend the following: A lot of help with walking and/or transfers;A lot of help with bathing/dressing/bathroom;Assistance with cooking/housework;Direct supervision/assist for medications management;Direct supervision/assist for financial management;Assist for transportation;Help with stairs or ramp for entrance   Can travel by private vehicle        Equipment Recommendations  Hospital bed;Hoyer lift;Wheelchair (measurements PT);Wheelchair cushion (measurements PT);BSC/3in1    Recommendations for Other Services       Precautions  / Restrictions Precautions Precautions: Fall Recall of Precautions/Restrictions: Impaired Precaution/Restrictions Comments: PEG Restrictions Weight Bearing Restrictions Per Provider Order: No     Mobility  Bed Mobility Overal bed mobility: Needs Assistance Bed Mobility: Supine to Sit     Supine to sit: Mod assist, HOB elevated, Used rails     General bed mobility comments: increased time and cues for sequencing. Pt able to advance BLE to EOB and requires assist for trunk elevation and scooting to EOB    Transfers Overall transfer level: Needs assistance Equipment used: 2 person hand held assist Transfers: Sit to/from Stand Sit to Stand: Mod assist, +2 physical assistance   Step pivot transfers: Max assist, +2 physical assistance       General transfer comment: STS from EOB and recliner with mod A +2 for power up.  Step pivot to recliner with assist to advance RLE and for weight shifting    Ambulation/Gait               General Gait Details: unable   Stairs             Wheelchair Mobility     Tilt Bed    Modified Rankin (Stroke Patients Only) Modified Rankin (Stroke Patients Only) Pre-Morbid Rankin Score: Moderately severe disability Modified Rankin: Severe disability     Balance Overall balance assessment: Needs assistance Sitting-balance support: Single extremity supported, Feet supported, Bilateral upper extremity supported Sitting balance-Leahy Scale: Poor Sitting balance - Comments: min-max A with 1 overt L lateral and anterior LOB requiring assist to correct during scooting   Standing balance support: Bilateral upper extremity supported, During functional activity, Reliant on assistive device for balance Standing balance-Leahy Scale: Poor Standing balance comment: reliant on external assist  Communication Communication Communication: Impaired Factors Affecting Communication: Difficulty expressing  self;Reduced clarity of speech  Cognition Arousal: Alert Behavior During Therapy: Flat affect, Restless   PT - Cognitive impairments: Awareness, Memory, Attention, Initiation, Sequencing, Safety/Judgement, Problem solving, Orientation                         Following commands: Impaired Following commands impaired: Follows one step commands inconsistently, Follows one step commands with increased time    Cueing Cueing Techniques: Verbal cues, Gestural cues, Tactile cues, Visual cues  Exercises      General Comments        Pertinent Vitals/Pain Pain Assessment Pain Assessment: No/denies pain     PT Goals (current goals can now be found in the care plan section) Acute Rehab PT Goals Patient Stated Goal: unable to state goal PT Goal Formulation: Patient unable to participate in goal setting Time For Goal Achievement: 07/05/24 Progress towards PT goals: Progressing toward goals    Frequency    Min 2X/week      PT Plan      Co-evaluation PT/OT/SLP Co-Evaluation/Treatment: Yes Reason for Co-Treatment: For patient/therapist safety;To address functional/ADL transfers;Necessary to address cognition/behavior during functional activity;Complexity of the patient's impairments (multi-system involvement) PT goals addressed during session: Mobility/safety with mobility;Balance        AM-PAC PT 6 Clicks Mobility   Outcome Measure  Help needed turning from your back to your side while in a flat bed without using bedrails?: A Little Help needed moving from lying on your back to sitting on the side of a flat bed without using bedrails?: A Lot Help needed moving to and from a bed to a chair (including a wheelchair)?: Total Help needed standing up from a chair using your arms (e.g., wheelchair or bedside chair)?: Total Help needed to walk in hospital room?: Total Help needed climbing 3-5 steps with a railing? : Total 6 Click Score: 9    End of Session Equipment  Utilized During Treatment: Gait belt Activity Tolerance: Patient limited by fatigue;Patient tolerated treatment well Patient left: with call bell/phone within reach;with family/visitor present;in chair;with chair alarm set;with nursing/sitter in room Nurse Communication: Mobility status PT Visit Diagnosis: Unsteadiness on feet (R26.81);Other abnormalities of gait and mobility (R26.89);Muscle weakness (generalized) (M62.81);Difficulty in walking, not elsewhere classified (R26.2);Other symptoms and signs involving the nervous system (R29.898)     Time: 8472-8443 PT Time Calculation (min) (ACUTE ONLY): 29 min  Charges:    $Therapeutic Activity: 8-22 mins PT General Charges $$ ACUTE PT VISIT: 1 Visit                     Darryle George, PTA Acute Rehabilitation Services Secure Chat Preferred  [phone]    Darryle George 07/03/2024, 4:05 PM

## 2024-07-03 NOTE — Progress Notes (Signed)
 Subjective: The patient is alert and pleasant.  Her mother is at the bedside.  Her mother tells me the patient has a tendency to pick at her scalp wound.  Objective: Vital signs in last 24 hours: Temp:  [97.5 F (36.4 C)-98.6 F (37 C)] 98.1 F (36.7 C) (10/30 0625) Pulse Rate:  [79-93] 81 (10/30 0625) Resp:  [14-29] 18 (10/30 0625) BP: (109-177)/(56-113) 157/69 (10/30 0625) SpO2:  [91 %-100 %] 99 % (10/30 0429) Weight:  [51 kg-54.5 kg] 51 kg (10/30 0500) Estimated body mass index is 19.92 kg/m as calculated from the following:   Height as of this encounter: 5' 3 (1.6 m).   Weight as of this encounter: 51 kg.   Intake/Output from previous day: 10/29 0701 - 10/30 0700 In: -  Out: 6000  Intake/Output this shift: No intake/output data recorded.  Physical exam the patient is alert and pleasant.  She is talking more.  She is moving all 4 extremities.  Her shunt insertions incisions are healing well.  Lab Results: Recent Labs    06/30/24 0901 07/02/24 0842  WBC 4.6 5.1  HGB 7.9* 7.6*  HCT 22.5* 22.5*  PLT 125* 144*   BMET Recent Labs    06/30/24 1030 07/02/24 0842  NA 124* 125*  K 3.8 3.9  CL 85* 84*  CO2 25 26  GLUCOSE 110* 134*  BUN 56* 65*  CREATININE 3.48* 4.56*  CALCIUM  9.5 10.1    Studies/Results: No results found.  Assessment/Plan: This post VP shunt.  The patient is recovering well.  We will keep the incision covered.  I encouraged the patient to refrain from picking at it.  LOS: 45 days     [name] 07/03/2024, 8:21 AM     Patient ID: [name] [name], female   DOB: Aug 23, 1981, 43 y.o.   MRN: [id number]

## 2024-07-03 NOTE — Progress Notes (Signed)
 Contacted nephrologist and renal NP to discuss pt's appropriateness for out-pt HD at d/c. Therapy staff added to conversation as well. Therapy staff to continue to work with pt to see how pt does with sitting in chair due to pt needing to be able to sit for HD at d/c. Will assist as needed.   Randine Mungo Dialysis Navigator [phone]

## 2024-07-03 NOTE — Plan of Care (Signed)
 Tube feeding parent. Sleeping most of the day.    Problem: Education: Goal: Ability to describe self-care measures that may prevent or decrease complications (Diabetes Survival Skills Education) will improve 07/03/2024 1526 by Vivian Carlos BIRCH, RN Outcome: Progressing 07/03/2024 1526 by Vivian Carlos BIRCH, RN Outcome: Progressing   Problem: Skin Integrity: Goal: Risk for impaired skin integrity will decrease 07/03/2024 1526 by Vivian Carlos BIRCH, RN Outcome: Progressing 07/03/2024 1526 by Vivian Carlos BIRCH, RN Outcome: Progressing   Problem: Safety: Goal: Ability to remain free from injury will improve Outcome: Progressing

## 2024-07-03 NOTE — Plan of Care (Signed)
  Problem: Health Behavior/Discharge Planning: Goal: Ability to manage health-related needs will improve Outcome: Progressing   Problem: Nutritional: Goal: Maintenance of adequate nutrition will improve Outcome: Progressing   Problem: Skin Integrity: Goal: Risk for impaired skin integrity will decrease Outcome: Progressing   Problem: Intracerebral Hemorrhage Tissue Perfusion: Goal: Complications of Intracerebral Hemorrhage will be minimized Outcome: Progressing   Problem: Coping: Goal: Will verbalize positive feelings about self Outcome: Progressing   Problem: Activity: Goal: Risk for activity intolerance will decrease Outcome: Progressing   Problem: Nutrition: Goal: Adequate nutrition will be maintained Outcome: Progressing   Problem: Elimination: Goal: Will not experience complications related to bowel motility Outcome: Progressing   Problem: Safety: Goal: Ability to remain free from injury will improve Outcome: Progressing   Problem: Skin Integrity: Goal: Risk for impaired skin integrity will decrease Outcome: Progressing

## 2024-07-03 NOTE — Progress Notes (Addendum)
 [facility] Progress Note   Subjective:    Seen and examined patient at bedside. Patient's mother also at bedside. Patient is sleeping and appears comfortable. Tolerated yesterday's HD with net UF 3L.   Objective Vitals:   07/03/24 0625 07/03/24 0858 07/03/24 1018 07/03/24 1117  BP: (!) 157/69 (!) 171/78 (!) 171/78 (!) 93/54  Pulse: 81 83 83 83  Resp: 18 18  17   Temp: 98.1 F (36.7 C) 97.8 F (36.6 C)  97.9 F (36.6 C)  TempSrc: Oral Axillary  Oral  SpO2:  100%    Weight:      Height:       Physical Exam General: Alert female in NAD Heart: RRR, no murmurs, rubs or gallops Lungs: CTA bilaterally, respirations unlabored Abdomen: Soft, non-distended, +BS Extremities: No edema b/l lower extremities Dialysis Access:  RUE AVF +t/b  Filed Weights   07/02/24 0856 07/02/24 1322 07/03/24 0500  Weight: 54.5 kg 51.5 kg 51 kg   No intake or output data in the 24 hours ending 07/03/24 1347  Additional Objective Labs: Basic Metabolic Panel: Recent Labs  Lab 06/30/24 0901 06/30/24 1030 07/02/24 0842  NA 118* 124* 125*  K 4.4 3.8 3.9  CL 78* 85* 84*  CO2 22 25 26   GLUCOSE 121* 110* 134*  BUN 89* 56* 65*  CREATININE 5.24* 3.48* 4.56*  CALCIUM  9.9 9.5 10.1  PHOS 4.4 2.6 4.3   Liver Function Tests: Recent Labs  Lab 06/30/24 0901 06/30/24 1030 07/02/24 0842  ALBUMIN  3.1* 3.1* 2.9*   No results for input(s): LIPASE, AMYLASE in the last 168 hours. CBC: Recent Labs  Lab 06/27/24 0849 06/30/24 0901 07/02/24 0842  WBC 3.5* 4.6 5.1  HGB 7.8* 7.9* 7.6*  HCT 24.3* 22.5* 22.5*  MCV 98.0 90.4 93.4  PLT 115* 125* 144*   Blood Culture    Component Value Date/Time   SDES CSF 06/11/2024 0920   SPECREQUEST LP 06/11/2024 0920   CULT  06/11/2024 0920    NO GROWTH 3 DAYS Performed at [facility], [address] [place], [name] [address]    REPTSTATUS 06/14/2024 FINAL 06/11/2024 0920    Cardiac Enzymes: No results for input(s): CKTOTAL,  CKMB, CKMBINDEX, TROPONINI in the last 168 hours. CBG: Recent Labs  Lab 07/02/24 2043 07/02/24 2333 07/03/24 0403 07/03/24 0854 07/03/24 1115  GLUCAP 151* 161* 122* 122* 138*   Iron  Studies: No results for input(s): IRON , TIBC, TRANSFERRIN, FERRITIN in the last 72 hours. Lab Results  Component Value Date   INR 1.1 06/18/2024   INR 1.1 06/16/2024   INR 1.1 05/20/2024   Studies/Results: No results found.  Medications:  feeding supplement (NEPRO CARB STEADY) 1,000 mL (07/02/24 1629)    amLODipine   10 mg Oral Once per day on Sunday Tuesday Thursday Saturday   carvedilol   25 mg Per Tube 2 times per day on Sunday Tuesday Thursday Saturday   Chlorhexidine  Gluconate Cloth  6 each Topical Q0600   cinacalcet   30 mg Oral Q M,W,F   cloNIDine   0.3 mg Per Tube Once per day on Sunday Tuesday Thursday Saturday   cycloSPORINE   200 mg Per Tube QPM   cycloSPORINE   225 mg Per Tube Daily   darbepoetin (ARANESP ) injection - DIALYSIS  150 mcg Subcutaneous Q Thu-1800   fiber  1 packet Per Tube TID   gabapentin   100 mg Oral Daily   Gerhardt's butt cream   Topical TID   insulin  aspart  0-9 Units Subcutaneous Q4H  lacosamide   100 mg Per Tube BID   multivitamin with minerals  1 tablet Per Tube Daily   mycophenolate   500 mg Per Tube BID   mouth rinse  15 mL Mouth Rinse 4 times per day   oxidized cellulose  1 each Topical Once   pantoprazole  (PROTONIX ) IV  40 mg Intravenous QHS   predniSONE   15 mg Per Tube Q breakfast    Assessment/Plan: 1.  Acute right intracranial hemorrhage: Associated with uncontrolled hypertension and status post conversion from EVD to VP shunt on 06/18/2024 by neurosurgery.  Mental status waxing/waning and she continues to require total assistance for PT/OT.  Awaiting acceptance to LTAC after failure to meet CIR criteria. 2. ESRD following failure of renal allograft: Continue hemodialysis on Monday/Wednesday/Friday. 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 Next HD 10/31. 3. Anemia: 7/9- slowly drifting down.  She remains on Aranesp  100 mcg weekly- increased dose  4. CKD-MBD: With marginally elevated calcium  that is suspected to be largely from immobility.  Status post pamidronate on 06/15/2024 and started on Cinacalcet  30 mg 3 days a week. 5. Nutrition: Currently on tube feeds with Nepro along with renal MVI 6. Hypertension/Volume: Intermittently elevated blood pressures noted on the current regimen, amlodipine  started recently and UF with HD, see below. Repeat CXR in AM. 7. Hyponatremia: Repeat Na yesterday was better but still low. Increasing her UF goals this week. Last Na 125.  [name], NP [facility] 07/03/2024,1:47 PM  LOS: 45 days

## 2024-07-03 NOTE — TOC Progression Note (Addendum)
 Transition of Care [facility]) - Progression Note    Patient Details  Name: [name] [name] MRN: [id number] Date of Birth: [birth year]  Transition of Care [facility]) CM/SW Contact  [name], RN Phone Number: 07/03/2024, 11:06 AM  Clinical Narrative:     Pts mother has selected [facility] for SNF. CM has sent this to Tammy with [facility] and they currently don't have a female bed but have offered her a bed when one is available.  IP Care management following.  1533: Pts mother asking to see if [facility] or [facility] could offer a bed. [facility] has declined. Awaiting [facility] to review.   1623: [facility] declined, [facility] and [address] have any beds. Mom will go with [facility] but really wanting a private room. Will start insurance.  Expected Discharge Plan: Long Term Acute Care (LTAC) Barriers to Discharge: Continued Medical Work up, Air [employer] and Services In-house Referral: Clinical Social Work   Post Acute Care Choice: Skilled Nursing Facility Living arrangements for the past 2 months: Apartment                                       Social Drivers of Health (SDOH) Interventions SDOH Screenings   Food Insecurity: Patient Unable To Answer (05/20/2024)  Housing: Patient Unable To Answer (05/20/2024)  Transportation Needs: Patient Unable To Answer (05/20/2024)  Utilities: Patient Unable To Answer (05/20/2024)  Social Connections: Unknown (10/04/2022)   Received from [facility]  Stress: No Stress Concern Present (04/16/2024)   Received from [facility]  Tobacco Use: Medium Risk (06/18/2024)    Readmission Risk Interventions    05/20/2024    4:16 PM 03/31/2024   11:27 AM 11/17/2021   12:15 PM  Readmission Risk Prevention Plan  Transportation Screening Complete Complete Complete  Medication Review Oceanographer) Complete Complete Complete  PCP or Specialist appointment within 3-5 days of  discharge Complete  Complete  HRI or Home Care Consult Complete Complete Complete  SW Recovery Care/Counseling Consult Complete Complete Complete  Palliative Care Screening Complete Not Applicable Not Applicable  Skilled Nursing Facility Complete Not Applicable Patient Refused

## 2024-07-03 NOTE — Progress Notes (Signed)
 PROGRESS NOTE    [name] [name]  FMW:[id number] DOB: Sep 09, 1980 DOA: 05/19/2024 PCP: [facility]   Brief Narrative:  [name] with PMH HTN, ischemic and hemorrhagic CVA, type 1 diabetes, gastroparesis, kidney and pancreas transplant 2014, subsequent failure of renal transplant, now ESRD on dialysis, anxiety, narcotic abuse. Presented to the [facility] ED from dialysis as a code stroke on 9/15; subsequently found to have hemorrhagic stroke. Neurology, neurosurgery, PCCM consulted. EVD was placed on the left side. Patient admitted to neuro ICU. She was intubated on admission. She had prolonged ICU stay with EVD drain which was changed to VP shunt on 10/15.  Extubated on 9/23. Transferred to [facility] service on 10/17.     She was recently discharged from here in August after she was managed for ischemic/hemorrhagic stroke and was discharged to LTAC.    Important events:  9/15 admitted with ICH plus IVH, EVD was placed for hydrocephalus, intubated 9/22 palliative care meeting.  Family is insistent that they want full scope of care 9/23 Extubated 10/1 -developed fevers pancultures drawn and broad-spectrum antibiotics started 10/2 became afebrile after starting antibiotics, did not tolerate raising EVD 10/6 EVD was raised from 10 to 30 cm water , tolerating well, remained afebrile 10/7 EVD is at 30 cm water , CT scan will be done tomorrow morning, no overnight issues 10/8 she became somnolent, CT head showed increasing hydrocephalus, EVD was titrated down to 10 cm of water  with improvement in mental status 10/15 VP shunt placement 10/17 transferred to progressive unit with [name]   Assessment & Plan:   Principal Problem:   Hemorrhagic stroke [facility]) Active Problems:   Moderate protein-calorie malnutrition   Pressure injury of skin   Nontraumatic subcortical hemorrhage of right cerebral hemisphere [facility])  Acute right parietotemporal intraparenchymal  hemorrhage/intraventricular hemorrhage Uncontrolled hypertension Obstructive hydrocephalus s/p EVD changed to VP shunt Seizure disorder s/p VP shunt placement 06/18/2024 by Dr. Mavis. Remains stable on Vimpat  100 mg twice daily. Neurosurgery following. Per them, no issue after the shunt.   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 Patient on CellCept  and prednisone . Dialyzed on MWF schedule. Has AV fistula on the right upper extremity.   Acute bronchitis Resolved.    Intermittent low-grade fever: Thought to be secondary to intracranial hemorrhage. Previous blood cultures showed contamination. S/p antibiotics for respiratory cultures positive for Serratia and Klebsiella. Repeat cultures negative thus far. Patient afebrile for several days.   History of pancreatic transplantation: On cyclosporine , mycophenolate , prednisone    Hypertension:  Patient currently on Coreg , amlodipine  and clonidine  on nondialysis days.  Patient's blood pressure much better controlled.   Anemia of chronic disease: No signs of active bleeding. Hemoglobin stable   Type 1 diabetes with vasculopathy/gastroparesis: Not on long-term insulin  anymore due to pancreatic transplant. Monitor blood sugars. On sliding scale.   Moderate protein calorie malnutrition/dysphagia: Has PEG tube. Continue tube feeds.  SLP and dietitian following.    Stage I sacral pressure ulcer present on admission: Continue wound care   Debility/deconditioning/disposition/goals of care: Young patient with multiple comorbidities.  Palliative care consulted for goals of care conversations, family insist she remains full code.  Palliative has signed off 10/17.  Seen by PT OT.  They recommended CIR.  Reviewed by CIR, they recommend LTAC.  Peer to peer with insurance company was done by myself on 06/25/2024, and they denied LTAC.  They recommended LTC.  Appeal for LTAC denied again.  TOC  working on placing her  to LTC.  Diarrhea: Continue Imodium  as needed.  Resolved.  DVT prophylaxis: SCDs Start: 05/19/24 1724   Code Status: Full Code  Family Communication: Discussed plan with mother at bedside.  Status is: Inpatient Remains inpatient appropriate because: Medically stable, pending placement to LTC,    Estimated body mass index is 19.92 kg/m as calculated from the following:   Height as of this encounter: 5' 3 (1.6 m).   Weight as of this encounter: 51 kg.  Wound 05/19/24 1933 Pressure Injury Buttocks Bilateral Stage 1 -  Intact skin with non-blanchable redness of a localized area usually over a bony prominence. (Active)   Nutritional Assessment: Body mass index is 19.92 kg/m.[name] Seen by dietician.  I agree with the assessment and plan as outlined below: Nutrition Status: Nutrition Problem: Moderate Malnutrition Etiology: chronic illness (ESRD on HD/DM and gastroparesis) Signs/Symptoms: severe muscle depletion, mild fat depletion Interventions: Refer to RD note for recommendations  . Skin Assessment: I have examined the patient's skin and I agree with the wound assessment as performed by the wound care RN as outlined below: Wound 05/19/24 1933 Pressure Injury Buttocks Bilateral Stage 1 -  Intact skin with non-blanchable redness of a localized area usually over a bony prominence. (Active)    Consultants:  General surgery, palliative care, nephrology, neurology  Procedures:  As above  Antimicrobials:  Anti-infectives (From admission, onward)    Start     Dose/Rate Route Frequency Ordered Stop   06/18/24 2030  ceFAZolin  (ANCEF ) IVPB 2g/100 mL premix  Status:  Discontinued        2 g 200 mL/hr over 30 Minutes Intravenous Every 8 hours 06/18/24 1454 06/19/24 1608   06/18/24 0600  ceFAZolin  (ANCEF ) IVPB 2g/100 mL premix        2 g 200 mL/hr over 30 Minutes Intravenous On call to O.R. 06/15/24 1152 06/18/24 1230   06/06/24 1400  piperacillin -tazobactam  (ZOSYN ) IVPB 2.25 g        2.25 g 100 mL/hr over 30 Minutes Intravenous Every 8 hours 06/06/24 1017 06/09/24 0538   06/06/24 1200  vancomycin  (VANCOREADY) IVPB 500 mg/100 mL        500 mg 100 mL/hr over 60 Minutes Intravenous Every M-W-F (Hemodialysis) 06/04/24 1745 06/06/24 1900   06/04/24 1845  vancomycin  (VANCOCIN ) IVPB 1000 mg/200 mL premix        1,000 mg 200 mL/hr over 60 Minutes Intravenous  Once 06/04/24 1745 06/04/24 1915   06/04/24 1830  ceFEPIme  (MAXIPIME ) 2 g in sodium chloride  0.9 % 100 mL IVPB  Status:  Discontinued        2 g 200 mL/hr over 30 Minutes Intravenous Every M-W-F (Hemodialysis) 06/04/24 1743 06/06/24 1017   05/27/24 1015  cefTRIAXone  (ROCEPHIN ) 2 g in sodium chloride  0.9 % 100 mL IVPB        2 g 200 mL/hr over 30 Minutes Intravenous Every 24 hours 05/27/24 0918 05/29/24 1014   05/23/24 1530  ceFEPIme  (MAXIPIME ) 1 g in sodium chloride  0.9 % 100 mL IVPB  Status:  Discontinued        1 g 200 mL/hr over 30 Minutes Intravenous Every 24 hours 05/23/24 1433 05/27/24 0918   05/22/24 2200  Ampicillin -Sulbactam (UNASYN ) 3 g in sodium chloride  0.9 % 100 mL IVPB  Status:  Discontinued        3 g 200 mL/hr over 30 Minutes Intravenous Every 24 hours 05/22/24 0834 05/23/24 1433   05/22/24 0930  Ampicillin -Sulbactam (UNASYN ) 3 g in sodium chloride  0.9 %  100 mL IVPB        3 g 200 mL/hr over 30 Minutes Intravenous  Once 05/22/24 0834 05/22/24 1900         Subjective: Patient seen and examined.  Mother at the bedside.  Patient sleeping.  Mother is concerned about possible skin irritation around anal area due to recurrent bowel movements.  She is aware that nursing are using moisturizer cream.  She would like another cream to be used which she showed me.  I advised her to discuss that with the nurses.  Objective: Vitals:   07/03/24 0429 07/03/24 0500 07/03/24 0543 07/03/24 0625  BP: (!) 170/73  (!) 177/69 (!) 157/69  Pulse: 80  83 81  Resp:    18  Temp: 98.1 F (36.7 C)   98.1 F (36.7 C) 98.1 F (36.7 C)  TempSrc: Oral  Oral Oral  SpO2: 99%     Weight:  51 kg    Height:        Intake/Output Summary (Last 24 hours) at 07/03/2024 0758 Last data filed at 07/02/2024 1338 Gross per 24 hour  Intake --  Output 6000 ml  Net -6000 ml    Filed Weights   07/02/24 0856 07/02/24 1322 07/03/24 0500  Weight: 54.5 kg 51.5 kg 51 kg     Examination: General: Chronically ill middle-age woman appears in no acute distress. CV: RRR. No murmurs, rubs, or gallops. No LE edema Pulmonary: Anterior lung sounds clear to auscultation. Normal effort. Abdominal: Soft, nontender, nondistended. Normal bowel sounds. Extremities: RUE AVF with palpable thrill. Palpable pedal pulses. Skin: Warm and dry. No obvious rash or lesions. Neuro: Awake.  Moves all extremities. Normal sensation to light touch.    Data Reviewed: I have personally reviewed following labs and imaging studies  CBC: Recent Labs  Lab 06/27/24 0849 06/30/24 0901 07/02/24 0842  WBC 3.5* 4.6 5.1  HGB 7.8* 7.9* 7.6*  HCT 24.3* 22.5* 22.5*  MCV 98.0 90.4 93.4  PLT 115* 125* 144*   Basic Metabolic Panel: Recent Labs  Lab 06/27/24 0848 06/30/24 0901 06/30/24 1030 07/02/24 0842  NA 129* 118* 124* 125*  K 4.1 4.4 3.8 3.9  CL 87* 78* 85* 84*  CO2 25 22 25 26   GLUCOSE 145* 121* 110* 134*  BUN 73* 89* 56* 65*  CREATININE 4.28* 5.24* 3.48* 4.56*  CALCIUM  10.3 9.9 9.5 10.1  PHOS 5.0* 4.4 2.6 4.3   GFR: Estimated Creatinine Clearance: 12.9 mL/min (A) (by C-G formula based on SCr of 4.56 mg/dL (H)). Liver Function Tests: Recent Labs  Lab 06/27/24 0848 06/30/24 0901 06/30/24 1030 07/02/24 0842  ALBUMIN  3.3* 3.1* 3.1* 2.9*   No results for input(s): LIPASE, AMYLASE in the last 168 hours. No results for input(s): AMMONIA in the last 168 hours. Coagulation Profile: No results for input(s): INR, PROTIME in the last 168 hours.  Cardiac Enzymes: No results for input(s): CKTOTAL,  CKMB, CKMBINDEX, TROPONINI in the last 168 hours. BNP (last 3 results) No results for input(s): PROBNP in the last 8760 hours. HbA1C: No results for input(s): HGBA1C in the last 72 hours. CBG: Recent Labs  Lab 07/02/24 0754 07/02/24 1608 07/02/24 2043 07/02/24 2333 07/03/24 0403  GLUCAP 152* 174* 151* 161* 122*   Lipid Profile: No results for input(s): CHOL, HDL, LDLCALC, TRIG, CHOLHDL, LDLDIRECT in the last 72 hours. Thyroid Function Tests: No results for input(s): TSH, T4TOTAL, FREET4, T3FREE, THYROIDAB in the last 72 hours. Anemia Panel: No results for input(s): VITAMINB12, FOLATE, FERRITIN, TIBC,  IRON , RETICCTPCT in the last 72 hours. Sepsis Labs: No results for input(s): PROCALCITON, LATICACIDVEN in the last 168 hours.  No results found for this or any previous visit (from the past 240 hours).   Radiology Studies: No results found.   Scheduled Meds:  amLODipine   10 mg Oral Once per day on Sunday Tuesday Thursday Saturday   carvedilol   25 mg Per Tube 2 times per day on Sunday Tuesday Thursday Saturday   Chlorhexidine  Gluconate Cloth  6 each Topical Q0600   cinacalcet   30 mg Oral Q M,W,F   cloNIDine   0.3 mg Per Tube Once per day on Sunday Tuesday Thursday Saturday   cycloSPORINE   200 mg Per Tube QPM   cycloSPORINE   225 mg Per Tube Daily   darbepoetin (ARANESP ) injection - DIALYSIS  150 mcg Subcutaneous Q Thu-1800   fiber  1 packet Per Tube TID   gabapentin   100 mg Oral Daily   Gerhardt's butt cream   Topical TID   insulin  aspart  0-9 Units Subcutaneous Q4H   lacosamide   100 mg Per Tube BID   multivitamin with minerals  1 tablet Per Tube Daily   mycophenolate   500 mg Per Tube BID   mouth rinse  15 mL Mouth Rinse 4 times per day   oxidized cellulose  1 each Topical Once   pantoprazole  (PROTONIX ) IV  40 mg Intravenous QHS   predniSONE   15 mg Per Tube Q breakfast   Continuous Infusions:  feeding supplement (NEPRO CARB  STEADY) 1,000 mL (07/02/24 1629)     LOS: 45 days   Fredia Skeeter, MD [name]  07/03/2024, 7:58 AM   *Please note that this is a verbal dictation therefore any spelling or grammatical errors are due to the Dragon Medical One system interpretation.  Please page via Amion and do not message via secure chat for urgent patient care matters. Secure chat can be used for non urgent patient care matters.  How to contact the [facility] Attending or Consulting provider 7A - 7P or covering provider during after hours 7P -7A, for this patient?  Check the care team in [facility] and look for a) attending/consulting [facility] provider listed and b) the [facility] team listed. Page or secure chat 7A-7P. Log into [email or web address] and use [place]'s universal password to access. If you do not have the password, please contact the hospital operator. Locate the [facility] provider you are looking for under [name] and page to a number that you can be directly reached. If you still have difficulty reaching the provider, please page the [facility] (Director on Call) for the Hospitalists listed on amion for assistance.

## 2024-07-03 NOTE — Progress Notes (Signed)
 Occupational Therapy Treatment Patient Details Name: [name] [name] MRN: [id number] DOB: 1981-04-18 Today's Date: 07/03/2024   History of present illness 43 yo female presents 05/19/24 for left gaze and L side weakness. 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 Intubated 9/15-9/23. EVD 9/15- 10/15; s/p shunt 10/15. S/p laparoscopy with intra-abdominal assistance ventricular-peritoneal shunt placement 10/15. PMH: HTN, ESRD on HD s/p renal and pancreatic transplants, DM, gastroparesis, multiple strokes (ischemic and hemorrhagic) with prepontine SAH, hydrocephalus requiring EVD placement   OT comments  Pt greeted in supine, agreeable for OT visit. Supportive mother at bedside. Pt alert, remains with flat affect, following one-step commands with inc time. She required max A for UB dressing with extensive multimodal cues for initiation, sequencing, and task execution. She was mod A +2 for STS and max A +2 to pivot to bedside chair. Pt with poor initiation of advancing RLE, reliant on therapists to initiate weightshifting. Remains inattentive to her surroundings in general. Pt starting to scoot self down in chair several times requiring x2 repositioning bouts prior to exiting room. Will continue to follow and progress as able.      If plan is discharge home, recommend the following:  Two people to help with walking and/or transfers;Two people to help with bathing/dressing/bathroom;Assistance with cooking/housework;Assist for transportation;Help with stairs or ramp for entrance   Equipment Recommendations  Other (comment) (defer to next level of care)    Recommendations for Other Services      Precautions / Restrictions Precautions Precautions: Fall Recall of Precautions/Restrictions: Impaired Precaution/Restrictions Comments: PEG Restrictions Weight Bearing Restrictions Per  Provider Order: No       Mobility Bed Mobility               General bed mobility comments: pt received seated EOB with PTA upon arrival, left upright in chair upon exit    Transfers Overall transfer level: Needs assistance Equipment used: 2 person hand held assist Transfers: Sit to/from Stand Sit to Stand: Mod assist, +2 physical assistance Stand pivot transfers: Max assist, +2 physical assistance, +2 safety/equipment   Step pivot transfers: Max assist, +2 physical assistance     General transfer comment: Stood with cues for powering up and sequencing, needed dependent RLE weightshift to advance RLE towards chair     Balance Overall balance assessment: Needs assistance Sitting-balance support: Single extremity supported, Feet supported, Bilateral upper extremity supported Sitting balance-Leahy Scale: Poor Sitting balance - Comments: min-max A to sustain sitting, pt with one episode of L lateral LOB with max A needed to correct Postural control: Left lateral lean, Posterior lean Standing balance support: Bilateral upper extremity supported, During functional activity Standing balance-Leahy Scale: Poor Standing balance comment: reliant on therapist support to remain upright                           ADL either performed or assessed with clinical judgement   ADL Overall ADL's : Needs assistance/impaired Eating/Feeding: NPO (PEG)               Upper Body Dressing : Maximal assistance;Sitting Upper Body Dressing Details (indicate cue type and reason): gown exchange, extensive multimodal cueing for threading/unthreading BUE, able to pull gown over shoulders with visual demonstration and simple commands                 Functional mobility during ADLs: Maximal assistance  Extremity/Trunk Assessment              Vision       Perception     Praxis Praxis Praxis: Impaired Praxis Impairment Details: Motor planning;Initiation    Communication Communication Communication: Impaired Factors Affecting Communication: Difficulty expressing self;Reduced clarity of speech   Cognition Arousal: Alert Behavior During Therapy: Flat affect, Restless Cognition: Cognition impaired Difficult to assess due to: Level of arousal Orientation impairments:  (not formally assessed this date) Awareness: Intellectual awareness impaired Memory impairment (select all impairments): Short-term memory Attention impairment (select first level of impairment): Focused attention Executive functioning impairment (select all impairments): Initiation, Organization, Sequencing, Problem solving OT - Cognition Comments: perseverative on everything hurts despite multiple attempts at repositioning and redirection/distraction                 Following commands: Impaired Following commands impaired: Follows one step commands inconsistently, Follows one step commands with increased time      Cueing   Cueing Techniques: Verbal cues, Gestural cues, Tactile cues, Visual cues  Exercises      Shoulder Instructions       General Comments supportive mother present, PEG tube leaking - RN notified and aware    Pertinent Vitals/ Pain       Pain Assessment Pain Assessment: Faces Faces Pain Scale: Hurts a little bit Pain Location: generalized Pain Intervention(s): Repositioned, Monitored during session  Home Living                                          Prior Functioning/Environment              Frequency  Min 2X/week        Progress Toward Goals  OT Goals(current goals can now be found in the care plan section)  Progress towards OT goals: Progressing toward goals (slowly)     Plan      Co-evaluation    PT/OT/SLP Co-Evaluation/Treatment: Yes Reason for Co-Treatment: For patient/therapist safety;To address functional/ADL transfers;Necessary to address cognition/behavior during functional  activity;Complexity of the patient's impairments (multi-system involvement) PT goals addressed during session: Mobility/safety with mobility;Balance OT goals addressed during session: ADL's and self-care (vision/cognition)      AM-PAC OT 6 Clicks Daily Activity     Outcome Measure   Help from another person eating meals?: Total Help from another person taking care of personal grooming?: A Lot Help from another person toileting, which includes using toliet, bedpan, or urinal?: Total Help from another person bathing (including washing, rinsing, drying)?: A Lot Help from another person to put on and taking off regular upper body clothing?: A Lot Help from another person to put on and taking off regular lower body clothing?: Total 6 Click Score: 9    End of Session Equipment Utilized During Treatment: Gait belt  OT Visit Diagnosis: Unsteadiness on feet (R26.81);Muscle weakness (generalized) (M62.81);History of falling (Z91.81);Other symptoms and signs involving cognitive function;Cognitive communication deficit (R41.841);Low vision, both eyes (H54.2) Symptoms and signs involving cognitive functions: Nontraumatic SAH   Activity Tolerance Patient tolerated treatment well   Patient Left in chair;with call bell/phone within reach;with chair alarm set;with family/visitor present   Nurse Communication Mobility status        Time: 8463-8443 OT Time Calculation (min): 20 min  Charges: OT General Charges $OT Visit: 1 Visit OT Treatments $Self Care/Home Management : 8-22 mins  Mersadie Kavanaugh D., MSOT,  OTR/L Acute Rehabilitation Services [phone] Secure Chat Preferred  Rikki Milch 07/03/2024, 5:20 PM

## 2024-07-04 ENCOUNTER — Inpatient Hospital Stay (HOSPITAL_COMMUNITY)

## 2024-07-04 DIAGNOSIS — I619 Nontraumatic intracerebral hemorrhage, unspecified: Secondary | ICD-10-CM | POA: Diagnosis not present

## 2024-07-04 LAB — GLUCOSE, CAPILLARY
Glucose-Capillary: 107 mg/dL — ABNORMAL HIGH (ref 70–99)
Glucose-Capillary: 107 mg/dL — ABNORMAL HIGH (ref 70–99)
Glucose-Capillary: 124 mg/dL — ABNORMAL HIGH (ref 70–99)
Glucose-Capillary: 127 mg/dL — ABNORMAL HIGH (ref 70–99)
Glucose-Capillary: 129 mg/dL — ABNORMAL HIGH (ref 70–99)
Glucose-Capillary: 155 mg/dL — ABNORMAL HIGH (ref 70–99)

## 2024-07-04 MED ORDER — LIDOCAINE-PRILOCAINE 2.5-2.5 % EX CREA: 1.0000 | TOPICAL_CREAM | CUTANEOUS | Status: DC | PRN

## 2024-07-04 MED ORDER — LOPERAMIDE HCL 1 MG/7.5ML PO SUSP
4.0000 mg | ORAL | Status: DC | PRN
  Administered 2024-07-04 – 2024-07-18 (×9): 4 mg
  Filled 2024-07-04 (×13): qty 30

## 2024-07-04 MED ORDER — PENTAFLUOROPROP-TETRAFLUOROETH EX AERO: 1.0000 | INHALATION_SPRAY | CUTANEOUS | Status: DC | PRN

## 2024-07-04 MED ORDER — GABAPENTIN 250 MG/5ML PO SOLN
100.0000 mg | Freq: Every day | ORAL | Status: DC
  Administered 2024-07-04 – 2024-08-03 (×30): 100 mg
  Filled 2024-07-04 (×39): qty 2

## 2024-07-04 MED ORDER — ZINC OXIDE 40 % EX OINT
TOPICAL_OINTMENT | Freq: Three times a day (TID) | CUTANEOUS | Status: DC
  Administered 2024-07-17: 1 via TOPICAL
  Filled 2024-07-04 (×2): qty 57

## 2024-07-04 MED ORDER — ALTEPLASE 2 MG IJ SOLR: 2.0000 mg | Freq: Once | INTRAMUSCULAR | Status: DC | PRN

## 2024-07-04 MED ORDER — ACETAMINOPHEN 325 MG PO TABS
ORAL_TABLET | ORAL | Status: AC
  Filled 2024-07-04: qty 2

## 2024-07-04 MED ORDER — NEPRO/CARBSTEADY PO LIQD
1000.0000 mL | ORAL | Status: DC
  Administered 2024-07-04 – 2024-07-05 (×2): 1000 mL
  Filled 2024-07-04: qty 1000

## 2024-07-04 MED ORDER — LIDOCAINE HCL (PF) 1 % IJ SOLN: 5.0000 mL | INTRAMUSCULAR | Status: DC | PRN

## 2024-07-04 MED ORDER — HEPARIN SODIUM (PORCINE) 1000 UNIT/ML DIALYSIS: 1000.0000 [IU] | INTRAMUSCULAR | Status: DC | PRN

## 2024-07-04 NOTE — Progress Notes (Signed)
 PROGRESS NOTE    [name] [name]  FMW:[id number] DOB: February 06, 1981 DOA: 05/19/2024 PCP: [facility]   Brief Narrative:  [name] with PMH HTN, ischemic and hemorrhagic CVA, type 1 diabetes, gastroparesis, kidney and pancreas transplant 2014, subsequent failure of renal transplant, now ESRD on dialysis, anxiety, narcotic abuse. Presented to the [facility] ED from dialysis as a code stroke on 9/15; subsequently found to have hemorrhagic stroke. Neurology, neurosurgery, PCCM consulted. EVD was placed on the left side. Patient admitted to neuro ICU. She was intubated on admission. She had prolonged ICU stay with EVD drain which was changed to VP shunt on 10/15.  Extubated on 9/23. Transferred to [facility] service on 10/17.     She was recently discharged from here in August after she was managed for ischemic/hemorrhagic stroke and was discharged to LTAC.    Important events:  9/15 admitted with ICH plus IVH, EVD was placed for hydrocephalus, intubated 9/22 palliative care meeting.  Family is insistent that they want full scope of care 9/23 Extubated 10/1 -developed fevers pancultures drawn and broad-spectrum antibiotics started 10/2 became afebrile after starting antibiotics, did not tolerate raising EVD 10/6 EVD was raised from 10 to 30 cm water , tolerating well, remained afebrile 10/7 EVD is at 30 cm water , CT scan will be done tomorrow morning, no overnight issues 10/8 she became somnolent, CT head showed increasing hydrocephalus, EVD was titrated down to 10 cm of water  with improvement in mental status 10/15 VP shunt placement 10/17 transferred to progressive unit with [name]   Assessment & Plan:   Principal Problem:   Hemorrhagic stroke [facility]) Active Problems:   Moderate protein-calorie malnutrition   Pressure injury of skin   Nontraumatic subcortical hemorrhage of right cerebral hemisphere [facility])  Acute right parietotemporal intraparenchymal  hemorrhage/intraventricular hemorrhage Uncontrolled hypertension Obstructive hydrocephalus s/p EVD changed to VP shunt Seizure disorder s/p VP shunt placement 06/18/2024 by Dr. Mavis. Remains stable on Vimpat  100 mg twice daily. Neurosurgery following. Per them, no issue after the shunt.   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 Patient on CellCept  and prednisone . Dialyzed on MWF schedule. Has AV fistula on the right upper extremity.   Acute bronchitis Resolved.    Intermittent low-grade fever: Thought to be secondary to intracranial hemorrhage. Previous blood cultures showed contamination. S/p antibiotics for respiratory cultures positive for Serratia and Klebsiella. Repeat cultures negative thus far. Patient afebrile for several days.   History of pancreatic transplantation: On cyclosporine , mycophenolate , prednisone    Hypertension:  Patient currently on Coreg , amlodipine  and clonidine  on nondialysis days.  Patient's blood pressure much better controlled.   Anemia of chronic disease: No signs of active bleeding. Hemoglobin stable   Type 1 diabetes with vasculopathy/gastroparesis: Not on long-term insulin  anymore due to pancreatic transplant. Monitor blood sugars. On sliding scale.   Moderate protein calorie malnutrition/dysphagia: Has PEG tube. Continue tube feeds.  SLP and dietitian following.    Stage I sacral pressure ulcer present on admission: Continue wound care   Debility/deconditioning/disposition/goals of care: Young patient with multiple comorbidities.  Palliative care consulted for goals of care conversations, family insist she remains full code.  Palliative has signed off 10/17.  Seen by PT OT.  They recommended CIR.  Reviewed by CIR, they recommend LTAC.  Peer to peer with insurance company was done by myself on 06/25/2024, and they denied LTAC.  They recommended LTC.  Appeal for LTAC denied again.  TOC  working on placing her  to LTC.  Diarrhea: Continue Imodium  as needed.  Resolved.  DVT prophylaxis: SCDs Start: 05/19/24 1724   Code Status: Full Code  Family Communication: Discussed plan with mother at bedside.  Status is: Inpatient Remains inpatient appropriate because: Medically stable, pending placement to LTC,    Estimated body mass index is 21.67 kg/m as calculated from the following:   Height as of this encounter: 5' 3 (1.6 m).   Weight as of this encounter: 55.5 kg.  Wound 05/19/24 1933 Pressure Injury Buttocks Bilateral Stage 1 -  Intact skin with non-blanchable redness of a localized area usually over a bony prominence. (Active)   Nutritional Assessment: Body mass index is 21.67 kg/m.[name] Seen by dietician.  I agree with the assessment and plan as outlined below: Nutrition Status: Nutrition Problem: Moderate Malnutrition Etiology: chronic illness (ESRD on HD/DM and gastroparesis) Signs/Symptoms: severe muscle depletion, mild fat depletion Interventions: Refer to RD note for recommendations  . Skin Assessment: I have examined the patient's skin and I agree with the wound assessment as performed by the wound care RN as outlined below: Wound 05/19/24 1933 Pressure Injury Buttocks Bilateral Stage 1 -  Intact skin with non-blanchable redness of a localized area usually over a bony prominence. (Active)    Consultants:  General surgery, palliative care, nephrology, neurology  Procedures:  As above  Antimicrobials:  Anti-infectives (From admission, onward)    Start     Dose/Rate Route Frequency Ordered Stop   06/18/24 2030  ceFAZolin  (ANCEF ) IVPB 2g/100 mL premix  Status:  Discontinued        2 g 200 mL/hr over 30 Minutes Intravenous Every 8 hours 06/18/24 1454 06/19/24 1608   06/18/24 0600  ceFAZolin  (ANCEF ) IVPB 2g/100 mL premix        2 g 200 mL/hr over 30 Minutes Intravenous On call to O.R. 06/15/24 1152 06/18/24 1230   06/06/24 1400  piperacillin -tazobactam  (ZOSYN ) IVPB 2.25 g        2.25 g 100 mL/hr over 30 Minutes Intravenous Every 8 hours 06/06/24 1017 06/09/24 0538   06/06/24 1200  vancomycin  (VANCOREADY) IVPB 500 mg/100 mL        500 mg 100 mL/hr over 60 Minutes Intravenous Every M-W-F (Hemodialysis) 06/04/24 1745 06/06/24 1900   06/04/24 1845  vancomycin  (VANCOCIN ) IVPB 1000 mg/200 mL premix        1,000 mg 200 mL/hr over 60 Minutes Intravenous  Once 06/04/24 1745 06/04/24 1915   06/04/24 1830  ceFEPIme  (MAXIPIME ) 2 g in sodium chloride  0.9 % 100 mL IVPB  Status:  Discontinued        2 g 200 mL/hr over 30 Minutes Intravenous Every M-W-F (Hemodialysis) 06/04/24 1743 06/06/24 1017   05/27/24 1015  cefTRIAXone  (ROCEPHIN ) 2 g in sodium chloride  0.9 % 100 mL IVPB        2 g 200 mL/hr over 30 Minutes Intravenous Every 24 hours 05/27/24 0918 05/29/24 1014   05/23/24 1530  ceFEPIme  (MAXIPIME ) 1 g in sodium chloride  0.9 % 100 mL IVPB  Status:  Discontinued        1 g 200 mL/hr over 30 Minutes Intravenous Every 24 hours 05/23/24 1433 05/27/24 0918   05/22/24 2200  Ampicillin -Sulbactam (UNASYN ) 3 g in sodium chloride  0.9 % 100 mL IVPB  Status:  Discontinued        3 g 200 mL/hr over 30 Minutes Intravenous Every 24 hours 05/22/24 0834 05/23/24 1433   05/22/24 0930  Ampicillin -Sulbactam (UNASYN ) 3 g in sodium chloride  0.9 %  100 mL IVPB        3 g 200 mL/hr over 30 Minutes Intravenous  Once 05/22/24 0834 05/22/24 1900         Subjective: Seen and examined dialysis unit.  Lethargic and does not want to have much of a conversation again today.  Appears comfortable.  Objective: Vitals:   07/04/24 1130 07/04/24 1200 07/04/24 1214 07/04/24 1215  BP: 98/61 97/70 (!) 96/56 114/78  Pulse: 82     Resp: (!) 23 19 (!) 27 (!) 28  Temp:   97.7 F (36.5 C)   TempSrc:   Oral   SpO2: 100% 100% 100% 100%  Weight:      Height:        Intake/Output Summary (Last 24 hours) at 07/04/2024 1225 Last data filed at 07/04/2024 1214 Gross per 24 hour   Intake 2093.67 ml  Output 2100 ml  Net -6.33 ml    Filed Weights   07/02/24 1322 07/03/24 0500 07/04/24 0809  Weight: 51.5 kg 51 kg 55.5 kg     Examination: General: Chronically ill middle-age woman appears in no acute distress. CV: RRR. No murmurs, rubs, or gallops. No LE edema Pulmonary: Anterior lung sounds clear to auscultation. Normal effort. Abdominal: Soft, nontender, nondistended. Normal bowel sounds. Extremities: RUE AVF with palpable thrill. Palpable pedal pulses. Skin: Warm and dry. No obvious rash or lesions. Neuro: Awake.  Moves all extremities. Normal sensation to light touch.    Data Reviewed: I have personally reviewed following labs and imaging studies  CBC: Recent Labs  Lab 06/30/24 0901 07/02/24 0842  WBC 4.6 5.1  HGB 7.9* 7.6*  HCT 22.5* 22.5*  MCV 90.4 93.4  PLT 125* 144*   Basic Metabolic Panel: Recent Labs  Lab 06/30/24 0901 06/30/24 1030 07/02/24 0842  NA 118* 124* 125*  K 4.4 3.8 3.9  CL 78* 85* 84*  CO2 22 25 26   GLUCOSE 121* 110* 134*  BUN 89* 56* 65*  CREATININE 5.24* 3.48* 4.56*  CALCIUM  9.9 9.5 10.1  PHOS 4.4 2.6 4.3   GFR: Estimated Creatinine Clearance: 13.3 mL/min (A) (by C-G formula based on SCr of 4.56 mg/dL (H)). Liver Function Tests: Recent Labs  Lab 06/30/24 0901 06/30/24 1030 07/02/24 0842  ALBUMIN  3.1* 3.1* 2.9*   No results for input(s): LIPASE, AMYLASE in the last 168 hours. No results for input(s): AMMONIA in the last 168 hours. Coagulation Profile: No results for input(s): INR, PROTIME in the last 168 hours.  Cardiac Enzymes: No results for input(s): CKTOTAL, CKMB, CKMBINDEX, TROPONINI in the last 168 hours. BNP (last 3 results) No results for input(s): PROBNP in the last 8760 hours. HbA1C: No results for input(s): HGBA1C in the last 72 hours. CBG: Recent Labs  Lab 07/03/24 1653 07/03/24 1953 07/04/24 0027 07/04/24 0442 07/04/24 0743  GLUCAP 211* 153* 107* 127* 124*    Lipid Profile: No results for input(s): CHOL, HDL, LDLCALC, TRIG, CHOLHDL, LDLDIRECT in the last 72 hours. Thyroid Function Tests: No results for input(s): TSH, T4TOTAL, FREET4, T3FREE, THYROIDAB in the last 72 hours. Anemia Panel: No results for input(s): VITAMINB12, FOLATE, FERRITIN, TIBC, IRON , RETICCTPCT in the last 72 hours. Sepsis Labs: No results for input(s): PROCALCITON, LATICACIDVEN in the last 168 hours.  No results found for this or any previous visit (from the past 240 hours).   Radiology Studies: No results found.   Scheduled Meds:  amLODipine   10 mg Oral Once per day on Sunday Tuesday Thursday Saturday   carvedilol   25  mg Per Tube 2 times per day on Sunday Tuesday Thursday Saturday   Chlorhexidine  Gluconate Cloth  6 each Topical Q0600   cinacalcet   30 mg Oral Q M,W,F   cloNIDine   0.3 mg Per Tube Once per day on Sunday Tuesday Thursday Saturday   cycloSPORINE   200 mg Per Tube QPM   cycloSPORINE   225 mg Per Tube Daily   darbepoetin (ARANESP ) injection - DIALYSIS  150 mcg Subcutaneous Q Thu-1800   fiber  1 packet Per Tube TID   gabapentin   100 mg Oral Daily   insulin  aspart  0-9 Units Subcutaneous Q4H   lacosamide   100 mg Per Tube BID   liver oil-zinc  oxide   Topical TID   multivitamin with minerals  1 tablet Per Tube Daily   mycophenolate   500 mg Per Tube BID   mouth rinse  15 mL Mouth Rinse 4 times per day   oxidized cellulose  1 each Topical Once   pantoprazole  (PROTONIX ) IV  40 mg Intravenous QHS   predniSONE   15 mg Per Tube Q breakfast   Continuous Infusions:  feeding supplement (NEPRO CARB STEADY) 1,000 mL (07/04/24 0755)     LOS: 46 days   Fredia Skeeter, MD [name]  07/04/2024, 12:25 PM   *Please note that this is a verbal dictation therefore any spelling or grammatical errors are due to the Dragon Medical One system interpretation.  Please page via Amion and do not message via secure chat for  urgent patient care matters. Secure chat can be used for non urgent patient care matters.  How to contact the [facility] Attending or Consulting provider 7A - 7P or covering provider during after hours 7P -7A, for this patient?  Check the care team in [facility] and look for a) attending/consulting [facility] provider listed and b) the [facility] team listed. Page or secure chat 7A-7P. Log into [email or web address] and use [place]'s universal password to access. If you do not have the password, please contact the hospital operator. Locate the [facility] provider you are looking for under [name] and page to a number that you can be directly reached. If you still have difficulty reaching the provider, please page the [facility] (Director on Call) for the Hospitalists listed on amion for assistance.

## 2024-07-04 NOTE — TOC Progression Note (Signed)
 Transition of Care [facility]) - Progression Note    Patient Details  Name: [name] [name] MRN: [id number] Date of Birth: 04-Feb-1981  Transition of [facility]) CM/SW Contact  [name], [name] Phone Number: 07/04/2024, 1:58 PM  Clinical Narrative:   CSW coordinated with Renal Navigator to discuss patient's appropriateness for outpatient HD for discharge planning. Patient needs to attend HD in chair to ensure she can do outpatient HD, will await clearance for outpatient HD before starting insurance authorization. CSW to follow.    Expected Discharge Plan: Long Term Acute Care (LTAC) Barriers to Discharge: Continued Medical Work up, Air Traffic Controller and Services In-house Referral: Clinical Social Work   Post Acute Care Choice: Skilled Nursing Facility Living arrangements for the past 2 months: Apartment                                       Social Drivers of Health (SDOH) Interventions SDOH Screenings   Food Insecurity: Patient Unable To Answer (05/20/2024)  Housing: Patient Unable To Answer (05/20/2024)  Transportation Needs: Patient Unable To Answer (05/20/2024)  Utilities: Patient Unable To Answer (05/20/2024)  Social Connections: Unknown (10/04/2022)   Received from [facility]  Stress: No Stress Concern Present (04/16/2024)   Received from [facility]  Tobacco Use: Medium Risk (06/18/2024)    Readmission Risk Interventions    05/20/2024    4:16 PM 03/31/2024   11:27 AM 11/17/2021   12:15 PM  Readmission Risk Prevention Plan  Transportation Screening Complete Complete Complete  Medication Review Oceanographer) Complete Complete Complete  PCP or Specialist appointment within 3-5 days of discharge Complete  Complete  HRI or Home Care Consult Complete Complete Complete  SW Recovery Care/Counseling Consult Complete Complete Complete  Palliative Care Screening Complete Not Applicable Not Applicable   Skilled Nursing Facility Complete Not Applicable Patient Refused

## 2024-07-04 NOTE — Progress Notes (Signed)
 [facility] Progress Note   Subjective:   Pt seen on HD. Alert. Reports she not been drinking a lot of fluids. Ongoing nausea. No SOB or CP.   Objective Vitals:   07/04/24 0400 07/04/24 0742 07/04/24 0809 07/04/24 0810  BP: 130/60 (!) 153/68  (!) 144/65  Pulse: 80 78  78  Resp:  19  (!) 21  Temp: 98.2 F (36.8 C) (!) 97.4 F (36.3 C)  99 F (37.2 C)  TempSrc: Oral Axillary  Oral  SpO2: 99% 100%  100%  Weight:   55.5 kg   Height:       Physical Exam  General: Alert female in NAD Heart: RRR, no murmurs, rubs or gallops Lungs: CTA bilaterally, respirations unlabored Abdomen: Soft, non-distended, +BS Extremities: No edema b/l lower extremities Dialysis Access:  RUE AVF accessed    Additional Objective Labs: Basic Metabolic Panel: Recent Labs  Lab 06/30/24 0901 06/30/24 1030 07/02/24 0842  NA 118* 124* 125*  K 4.4 3.8 3.9  CL 78* 85* 84*  CO2 22 25 26   GLUCOSE 121* 110* 134*  BUN 89* 56* 65*  CREATININE 5.24* 3.48* 4.56*  CALCIUM  9.9 9.5 10.1  PHOS 4.4 2.6 4.3   Liver Function Tests: Recent Labs  Lab 06/30/24 0901 06/30/24 1030 07/02/24 0842  ALBUMIN  3.1* 3.1* 2.9*   No results for input(s): LIPASE, AMYLASE in the last 168 hours. CBC: Recent Labs  Lab 06/27/24 0849 06/30/24 0901 07/02/24 0842  WBC 3.5* 4.6 5.1  HGB 7.8* 7.9* 7.6*  HCT 24.3* 22.5* 22.5*  MCV 98.0 90.4 93.4  PLT 115* 125* 144*   Blood Culture    Component Value Date/Time   SDES CSF 06/11/2024 0920   SPECREQUEST LP 06/11/2024 0920   CULT  06/11/2024 0920    NO GROWTH 3 DAYS Performed at [facility], [address] [place], [name] [address]    REPTSTATUS 06/14/2024 FINAL 06/11/2024 0920    Cardiac Enzymes: No results for input(s): CKTOTAL, CKMB, CKMBINDEX, TROPONINI in the last 168 hours. CBG: Recent Labs  Lab 07/03/24 1653 07/03/24 1953 07/04/24 0027 07/04/24 0442 07/04/24 0743  GLUCAP 211* 153* 107* 127* 124*   Iron  Studies: No  results for input(s): IRON , TIBC, TRANSFERRIN, FERRITIN in the last 72 hours. @lablastinr3 @ Studies/Results: No results found. Medications:  feeding supplement (NEPRO CARB STEADY) 1,000 mL (07/04/24 0755)    amLODipine   10 mg Oral Once per day on Sunday Tuesday Thursday Saturday   carvedilol   25 mg Per Tube 2 times per day on Sunday Tuesday Thursday Saturday   Chlorhexidine  Gluconate Cloth  6 each Topical Q0600   cinacalcet   30 mg Oral Q M,W,F   cloNIDine   0.3 mg Per Tube Once per day on Sunday Tuesday Thursday Saturday   cycloSPORINE   200 mg Per Tube QPM   cycloSPORINE   225 mg Per Tube Daily   darbepoetin (ARANESP ) injection - DIALYSIS  150 mcg Subcutaneous Q Thu-1800   fiber  1 packet Per Tube TID   gabapentin   100 mg Oral Daily   Gerhardt's butt cream   Topical TID   insulin  aspart  0-9 Units Subcutaneous Q4H   lacosamide   100 mg Per Tube BID   multivitamin with minerals  1 tablet Per Tube Daily   mycophenolate   500 mg Per Tube BID   mouth rinse  15 mL Mouth Rinse 4 times per day   oxidized cellulose  1 each Topical Once   pantoprazole  (PROTONIX ) IV  40 mg  Intravenous QHS   predniSONE   15 mg Per Tube Q breakfast    Assessment/Plan: 1.  Acute right intracranial hemorrhage: Associated with uncontrolled hypertension and status post conversion from EVD to VP shunt on 06/18/2024 by neurosurgery.  Mental status waxing/waning and she continues to require total assistance for PT/OT.  Awaiting acceptance to LTAC after failure to meet CIR criteria. 2. ESRD following failure of renal allograft: Continue hemodialysis on Monday/Wednesday/Friday. 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 Next HD 10/31. 3. Anemia: 7.6- slowly drifting down.  She remains on Aranesp  100 mcg weekly- increased dose  4. CKD-MBD: With marginally elevated calcium  that is suspected to  be largely from immobility.  Status post pamidronate on 06/15/2024 and started on Cinacalcet  30 mg 3 days a week. 5. Nutrition: Currently on tube feeds with Nepro along with renal MVI 6. Hypertension/Volume: Intermittently elevated blood pressures noted on the current regimen, amlodipine  started recently and UF with HD, see below.  7. Hyponatremia: Repeat Na was better but still low. Increasing her UF goals this week. Last Na 125.    Lucie Collet, PA-C 07/04/2024, 8:15 AM  [facility] Pager: [phone]

## 2024-07-04 NOTE — Plan of Care (Signed)
  Problem: Education: Goal: Ability to describe self-care measures that may prevent or decrease complications (Diabetes Survival Skills Education) will improve Outcome: Progressing   Problem: Skin Integrity: Goal: Risk for impaired skin integrity will decrease Outcome: Progressing   

## 2024-07-04 NOTE — Progress Notes (Signed)
 Received patient in bed to unit.  Alert and oriented.  Informed consent signed and in chart.   TX duration:3.5 hours.  Patient had low BP and had UF off for about an hour.  Soft restraint applied per Dialysis protcal at beginning of session due to her bending her access arm.  Restraint taken off at end of session.   Patient tolerated well.  Transported back to the room  Alert, without acute distress.  Hand-off given to patient's nurse.   Access used: Right Upper Arm fistula Access issues: none  Total UF removed: 2.1L Medication(s) given: Tylenol    07/04/24 1214  Vitals  Temp 97.7 F (36.5 C)  Temp Source Oral  BP (!) 96/56  ECG Heart Rate 83  Resp (!) 27  Oxygen  Therapy  SpO2 100 %  O2 Device Room Air  During Treatment Monitoring  Dialysate Potassium Concentration 3  Dialysate Calcium  Concentration 2.5  Duration of HD Treatment -hour(s) 3.5 hour(s)  HD Safety Checks Performed Yes  Intra-Hemodialysis Comments Tx completed  Post Treatment  Dialyzer Clearance Clear  Liters Processed 65.6  Fluid Removed (mL) 2100 mL  Tolerated HD Treatment No (Comment)  Post-Hemodialysis Comments Patient BP low and had UF off for about an hour.  Soft restraint applied on access arm to remind patient to not bend arm.  Took soft restraint off.  Fistula / Graft Right Upper arm Arteriovenous fistula  Placement Date/Time: (c) 12/08/20 1403   Placed prior to admission: Yes  Orientation: Right  Access Location: Upper arm  Access Type: Arteriovenous fistula  Site Condition No complications  Fistula / Graft Assessment Present;Thrill;Bruit  Status Patent;Deaccessed  Drainage Description None     Camellia Brasil LPN [facility] D[facility]

## 2024-07-04 NOTE — Progress Notes (Signed)
 Nutrition Follow-up  DOCUMENTATION CODES:  Non-severe (moderate) malnutrition in context of chronic illness  INTERVENTION:  Continue tube feeding via G-J tube: Nepro at 45 ml/h (1080 ml per day) Provides 1912 kcal, 87 gm protein, 785 ml free water  daily Nutrisource fiber TID  MVI with minerals daily - was liquid but changed by pharmacy team  NUTRITION DIAGNOSIS:  Moderate Malnutrition related to chronic illness (ESRD on HD/DM and gastroparesis) as evidenced by severe muscle depletion, mild fat depletion. - remains applicable  GOAL:  Patient will meet greater than or equal to 90% of their needs - progressing  MONITOR:  Weight trends, TF tolerance, Skin, Labs, I & O's  REASON FOR ASSESSMENT:  Consult Enteral/tube feeding initiation and management  ASSESSMENT:  43 year old female who presented as a Code Stroke with IVH. 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 Recent admissions 6/11-7/28 and 7/29-8/13 for ICH with GJ tube.  6/11 - 7/29: Hospital admission, discharge to [facility] 8/12 - G tube converted to GJ  9/16 - Admitted, EVD placed, intubated 9/21 - switched to Nepro formula; FMS placed 9/22 - Extubated  9/23 - adjusted rate of Nepro to meet needs (was previously exceeding) 9/25 - D/C Nepro, Started Osmolite 1.5 10/3 - Renal changed TF to Nepro due to higher K (likely due to no HD over weekend) 10/15 s/p conversion from EVD to VP shunt  10/16 - Transferred to PCU  Pt out of room for HD at the time of assessment. Reviewed chart and no major changes since last assessment. Will continue formula but adjust rate. Pt continues with excessive stooling. Overall stable for discharge, just waiting on bed and for pt to be able to sit for HD.  Admit weight: 50.4 kg  Current weight: 53.5 kg    Nutritionally  Relevant Medications: Scheduled Meds:  cinacalcet   30 mg Oral Q M,W,F   cycloSPORINE   200 mg Per Tube QPM   cycloSPORINE   225 mg Per Tube Daily   fiber  1 packet Per Tube TID   insulin  aspart  0-9 Units Subcutaneous Q4H   multivitamin with minerals  1 tablet Per Tube Daily   pantoprazole  IV  40 mg Intravenous QHS   predniSONE   15 mg Per Tube Q breakfast   Continuous Infusions:  feeding supplement (NEPRO CARB STEADY) 1,000 mL (07/04/24 0755)   PRN Meds: diphenhydrAMINE , loperamide  HCl, phenol, sennosides  Labs Reviewed from 10/29: Sodium 125, chloride 84 BUN 65, creatinine 4.56 CBG ranges from 107-211 mg/dL over the last 24 hours HgbA1c 3.6% (9/16)  Micronutrient Profile: CRP 0.6 Copper 66, low (10/4) Folate >20 (10/4)  Vitamin C 1.6 (10/4) Vitamin D  37.13 (10/12) Zinc  74 (10/4)  NUTRITION - FOCUSED PHYSICAL EXAM: Flowsheet Row Most Recent Value  Orbital Region Mild depletion  Upper Arm Region Moderate depletion  Thoracic and Lumbar Region Mild depletion  Buccal Region Mild depletion  Temple Region Severe depletion  Clavicle Bone Region Severe depletion  Clavicle and Acromion Bone Region Severe depletion  Scapular Bone Region Severe depletion  Dorsal Hand Moderate depletion  Patellar Region Severe depletion  Anterior Thigh Region Severe depletion  Posterior Calf Region Severe depletion  Edema (RD Assessment) Mild  Hair Reviewed  Eyes Unable to assess  Mouth Unable to assess  Skin Reviewed  [Closed surgical incision on head]  Nails Reviewed    Diet Order:   Diet Order  None       EDUCATION NEEDS:  Not appropriate for education at this time  Skin:  Skin Assessment: Skin Integrity Issues: Skin Integrity Issues:: Stage I Stage I: buttocks  Last BM:  10/31 - multiple instances today (4x today per flowsheet)  Height:  Ht Readings from Last 1 Encounters:  06/18/24 5' 3 (1.6 m)    Weight:  Wt Readings from Last 1 Encounters:  07/04/24 53.5 kg     Ideal Body Weight:  52.3 kg  BMI:  Body mass index is 20.89 kg/m.  Estimated Nutritional Needs:  Kcal:  1700-1900 Protein:  80-100 grams Fluid:  1L +UOP    Vernell Lukes, RD, LDN, CNSC Registered Dietitian II Please reach out via secure chat

## 2024-07-04 NOTE — Plan of Care (Signed)
   Problem: Skin Integrity: Goal: Risk for impaired skin integrity will decrease Outcome: Progressing

## 2024-07-04 NOTE — Consult Note (Addendum)
 WOC Nurse Consult Note: EOC consult performed remotely utilizing imaging and chart review  Reason for Consult: moisture associated skin damage Wound type: moisture associated skin damage to peri rectal area from frequent loose stools Pressure Injury POA: NA Measurement: see nursing flow sheets Wound bed: pink, moist, partial and full thickness wounds Drainage (amount, consistency, odor) see nursing flow sheets Periwound: intact Dressing procedure/placement/frequency:  Cleanse with warm water  and perineal cleanser after each episode of incontinence.  Apply Desitin to areas of denudation three times daily and PRN.  May need to consider a stool collection device if episodes of loose stools continue to further protect the skin.    WOC team will not follow at this time, please re consult if new needs arise or wound deteriorates.  Thank you,  Doyal Polite, MSN, RN, [facility] WOC Team [phone] (Available Mon-Fri 0700-1500)

## 2024-07-04 NOTE — Progress Notes (Signed)
 Pt's situation discussed with therapy staff, CSW,and renal PA. Pt will need to trial HD in chair prior to d/c to ensure pt appropriate for out-pt HD at d/c. Will assist as needed.   Randine Mungo Dialysis Navigator [phone]

## 2024-07-05 DIAGNOSIS — I619 Nontraumatic intracerebral hemorrhage, unspecified: Secondary | ICD-10-CM | POA: Diagnosis not present

## 2024-07-05 LAB — GLUCOSE, CAPILLARY
Glucose-Capillary: 115 mg/dL — ABNORMAL HIGH (ref 70–99)
Glucose-Capillary: 125 mg/dL — ABNORMAL HIGH (ref 70–99)
Glucose-Capillary: 129 mg/dL — ABNORMAL HIGH (ref 70–99)
Glucose-Capillary: 140 mg/dL — ABNORMAL HIGH (ref 70–99)
Glucose-Capillary: 151 mg/dL — ABNORMAL HIGH (ref 70–99)
Glucose-Capillary: 175 mg/dL — ABNORMAL HIGH (ref 70–99)
Glucose-Capillary: 98 mg/dL (ref 70–99)

## 2024-07-05 LAB — BASIC METABOLIC PANEL WITH GFR
Anion gap: 16 — ABNORMAL HIGH (ref 5–15)
BUN: 49 mg/dL — ABNORMAL HIGH (ref 6–20)
CO2: 25 mmol/L (ref 22–32)
Calcium: 9.9 mg/dL (ref 8.9–10.3)
Chloride: 88 mmol/L — ABNORMAL LOW (ref 98–111)
Creatinine, Ser: 3.12 mg/dL — ABNORMAL HIGH (ref 0.44–1.00)
GFR, Estimated: 18 mL/min — ABNORMAL LOW (ref >60)
Glucose, Bld: 169 mg/dL — ABNORMAL HIGH (ref 70–99)
Potassium: 4.3 mmol/L (ref 3.5–5.1)
Sodium: 129 mmol/L — ABNORMAL LOW (ref 135–145)

## 2024-07-05 MED ORDER — NEPRO/CARBSTEADY PO LIQD
1000.0000 mL | ORAL | Status: DC
  Administered 2024-07-06 – 2024-08-05 (×37): 1000 mL
  Filled 2024-07-05 (×34): qty 1000

## 2024-07-05 NOTE — Progress Notes (Signed)
 PROGRESS NOTE    [name] [name]  FMW:[id number] DOB: 10-03-1980 DOA: 05/19/2024 PCP: [facility]   Brief Narrative:  [name] with PMH HTN, ischemic and hemorrhagic CVA, type 1 diabetes, gastroparesis, kidney and pancreas transplant 2014, subsequent failure of renal transplant, now ESRD on dialysis, anxiety, narcotic abuse. Presented to the [facility] ED from dialysis as a code stroke on 9/15; subsequently found to have hemorrhagic stroke. Neurology, neurosurgery, PCCM consulted. EVD was placed on the left side. Patient admitted to neuro ICU. She was intubated on admission. She had prolonged ICU stay with EVD drain which was changed to VP shunt on 10/15.  Extubated on 9/23. Transferred to [facility] service on 10/17.     She was recently discharged from here in August after she was managed for ischemic/hemorrhagic stroke and was discharged to LTAC.    Important events:  9/15 admitted with ICH plus IVH, EVD was placed for hydrocephalus, intubated 9/22 palliative care meeting.  Family is insistent that they want full scope of care 9/23 Extubated 10/1 -developed fevers pancultures drawn and broad-spectrum antibiotics started 10/2 became afebrile after starting antibiotics, did not tolerate raising EVD 10/6 EVD was raised from 10 to 30 cm water , tolerating well, remained afebrile 10/7 EVD is at 30 cm water , CT scan will be done tomorrow morning, no overnight issues 10/8 she became somnolent, CT head showed increasing hydrocephalus, EVD was titrated down to 10 cm of water  with improvement in mental status 10/15 VP shunt placement 10/17 transferred to progressive unit with [name]   Assessment & Plan:   Principal Problem:   Hemorrhagic stroke [facility]) Active Problems:   Moderate protein-calorie malnutrition   Pressure injury of skin   Nontraumatic subcortical hemorrhage of right cerebral hemisphere [facility])  Acute right parietotemporal intraparenchymal  hemorrhage/intraventricular hemorrhage Uncontrolled hypertension Obstructive hydrocephalus s/p EVD changed to VP shunt Seizure disorder s/p VP shunt placement 06/18/2024 by Dr. Mavis. Remains stable on Vimpat  100 mg twice daily. Neurosurgery following. Per them, no issue after the shunt.   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 Patient on CellCept  and prednisone . Dialyzed on MWF schedule. Has AV fistula on the right upper extremity.   Acute bronchitis Resolved.    Intermittent low-grade fever: Thought to be secondary to intracranial hemorrhage. Previous blood cultures showed contamination. S/p antibiotics for respiratory cultures positive for Serratia and Klebsiella. Repeat cultures negative thus far. Patient afebrile for several days.   History of pancreatic transplantation: On cyclosporine , mycophenolate , prednisone    Hypertension:  Patient currently on Coreg , amlodipine  and clonidine  on nondialysis days.  Patient's blood pressure much better controlled.   Anemia of chronic disease: No signs of active bleeding. Hemoglobin stable   Type 1 diabetes with vasculopathy/gastroparesis: Not on long-term insulin  anymore due to pancreatic transplant. Monitor blood sugars. On sliding scale.   Moderate protein calorie malnutrition/dysphagia: Has PEG tube. Continue tube feeds.  SLP and dietitian following.    Stage I sacral pressure ulcer present on admission: Continue wound care   Debility/deconditioning/disposition/goals of care: Young patient with multiple comorbidities.  Palliative care consulted for goals of care conversations, family insist she remains full code.  Palliative has signed off 10/17.  Seen by PT OT.  They recommended CIR.  Reviewed by CIR, they recommend LTAC.  Peer to peer with insurance company was done by myself on 06/25/2024, and they denied LTAC.  They recommended LTC.  Appeal for LTAC denied again.  TOC  working on placing her  to LTC.  Diarrhea: Continue Imodium  as needed.  Resolved.  DVT prophylaxis: SCDs Start: 05/19/24 1724   Code Status: Full Code  Family Communication: Discussed plan with mother at bedside.  Status is: Inpatient Remains inpatient appropriate because: Medically stable, pending placement to LTC,    Estimated body mass index is 20.89 kg/m as calculated from the following:   Height as of this encounter: 5' 3 (1.6 m).   Weight as of this encounter: 53.5 kg.  Wound 05/19/24 1933 Pressure Injury Buttocks Bilateral Stage 1 -  Intact skin with non-blanchable redness of a localized area usually over a bony prominence. (Active)   Nutritional Assessment: Body mass index is 20.89 kg/m.[name] Seen by dietician.  I agree with the assessment and plan as outlined below: Nutrition Status: Nutrition Problem: Moderate Malnutrition Etiology: chronic illness (ESRD on HD/DM and gastroparesis) Signs/Symptoms: severe muscle depletion, mild fat depletion Interventions: Tube feeding, Prostat, MVI  . Skin Assessment: I have examined the patient's skin and I agree with the wound assessment as performed by the wound care RN as outlined below: Wound 05/19/24 1933 Pressure Injury Buttocks Bilateral Stage 1 -  Intact skin with non-blanchable redness of a localized area usually over a bony prominence. (Active)    Consultants:  General surgery, palliative care, nephrology, neurology  Procedures:  As above  Antimicrobials:  Anti-infectives (From admission, onward)    Start     Dose/Rate Route Frequency Ordered Stop   06/18/24 2030  ceFAZolin  (ANCEF ) IVPB 2g/100 mL premix  Status:  Discontinued        2 g 200 mL/hr over 30 Minutes Intravenous Every 8 hours 06/18/24 1454 06/19/24 1608   06/18/24 0600  ceFAZolin  (ANCEF ) IVPB 2g/100 mL premix        2 g 200 mL/hr over 30 Minutes Intravenous On call to O.R. 06/15/24 1152 06/18/24 1230   06/06/24 1400  piperacillin -tazobactam (ZOSYN ) IVPB  2.25 g        2.25 g 100 mL/hr over 30 Minutes Intravenous Every 8 hours 06/06/24 1017 06/09/24 0538   06/06/24 1200  vancomycin  (VANCOREADY) IVPB 500 mg/100 mL        500 mg 100 mL/hr over 60 Minutes Intravenous Every M-W-F (Hemodialysis) 06/04/24 1745 06/06/24 1900   06/04/24 1845  vancomycin  (VANCOCIN ) IVPB 1000 mg/200 mL premix        1,000 mg 200 mL/hr over 60 Minutes Intravenous  Once 06/04/24 1745 06/04/24 1915   06/04/24 1830  ceFEPIme  (MAXIPIME ) 2 g in sodium chloride  0.9 % 100 mL IVPB  Status:  Discontinued        2 g 200 mL/hr over 30 Minutes Intravenous Every M-W-F (Hemodialysis) 06/04/24 1743 06/06/24 1017   05/27/24 1015  cefTRIAXone  (ROCEPHIN ) 2 g in sodium chloride  0.9 % 100 mL IVPB        2 g 200 mL/hr over 30 Minutes Intravenous Every 24 hours 05/27/24 0918 05/29/24 1014   05/23/24 1530  ceFEPIme  (MAXIPIME ) 1 g in sodium chloride  0.9 % 100 mL IVPB  Status:  Discontinued        1 g 200 mL/hr over 30 Minutes Intravenous Every 24 hours 05/23/24 1433 05/27/24 0918   05/22/24 2200  Ampicillin -Sulbactam (UNASYN ) 3 g in sodium chloride  0.9 % 100 mL IVPB  Status:  Discontinued        3 g 200 mL/hr over 30 Minutes Intravenous Every 24 hours 05/22/24 0834 05/23/24 1433   05/22/24 0930  Ampicillin -Sulbactam (UNASYN ) 3 g in sodium chloride  0.9 % 100  mL IVPB        3 g 200 mL/hr over 30 Minutes Intravenous  Once 05/22/24 0834 05/22/24 1900         Subjective: Patient was seen and examined at bedside during morning rounds. Patient did not sleep last known well so she was sleepy and I tried to wake her up but she was in deep sleep. Patient seems to be resting comfortably without any acute distress.  Objective: Vitals:   07/04/24 2021 07/04/24 2325 07/05/24 0359 07/05/24 1244  BP: 107/65 (!) 135/52 (!) 128/59 (!) 94/57  Pulse: 86 90 81 76  Resp: 20 16 17 18   Temp: 98.6 F (37 C) 97.6 F (36.4 C) 98.1 F (36.7 C) 98.3 F (36.8 C)  TempSrc: Oral Oral Oral Oral  SpO2: 97%  100% 100% 99%  Weight:      Height:        Intake/Output Summary (Last 24 hours) at 07/05/2024 1359 Last data filed at 07/05/2024 9060 Gross per 24 hour  Intake 180 ml  Output --  Net 180 ml    Filed Weights   07/03/24 0500 07/04/24 0809 07/04/24 1214  Weight: 51 kg 55.5 kg 53.5 kg     Examination: General: Chronically ill middle-age woman appears in no acute distress. CV: RRR. No murmurs, rubs, or gallops. No LE edema Pulmonary: Anterior lung sounds clear to auscultation. Normal effort. Abdominal: Soft, nontender, nondistended. Normal bowel sounds. Extremities: RUE AVF with palpable thrill. Palpable pedal pulses. Skin: Warm and dry. No obvious rash or lesions. Neuro: Awake.  Moves all extremities. Normal sensation to light touch.    Data Reviewed: I have personally reviewed following labs and imaging studies  CBC: Recent Labs  Lab 06/30/24 0901 07/02/24 0842  WBC 4.6 5.1  HGB 7.9* 7.6*  HCT 22.5* 22.5*  MCV 90.4 93.4  PLT 125* 144*   Basic Metabolic Panel: Recent Labs  Lab 06/30/24 0901 06/30/24 1030 07/02/24 0842 07/05/24 1152  NA 118* 124* 125* 129*  K 4.4 3.8 3.9 4.3  CL 78* 85* 84* 88*  CO2 22 25 26 25   GLUCOSE 121* 110* 134* 169*  BUN 89* 56* 65* 49*  CREATININE 5.24* 3.48* 4.56* 3.12*  CALCIUM  9.9 9.5 10.1 9.9  PHOS 4.4 2.6 4.3  --    GFR: Estimated Creatinine Clearance: 19.4 mL/min (A) (by C-G formula based on SCr of 3.12 mg/dL (H)). Liver Function Tests: Recent Labs  Lab 06/30/24 0901 06/30/24 1030 07/02/24 0842  ALBUMIN  3.1* 3.1* 2.9*   No results for input(s): LIPASE, AMYLASE in the last 168 hours. No results for input(s): AMMONIA in the last 168 hours. Coagulation Profile: No results for input(s): INR, PROTIME in the last 168 hours.  Cardiac Enzymes: No results for input(s): CKTOTAL, CKMB, CKMBINDEX, TROPONINI in the last 168 hours. BNP (last 3 results) No results for input(s): PROBNP in the last 8760  hours. HbA1C: No results for input(s): HGBA1C in the last 72 hours. CBG: Recent Labs  Lab 07/04/24 2038 07/05/24 0022 07/05/24 0401 07/05/24 0858 07/05/24 1242  GLUCAP 107* 129* 98 115* 175*   Lipid Profile: No results for input(s): CHOL, HDL, LDLCALC, TRIG, CHOLHDL, LDLDIRECT in the last 72 hours. Thyroid Function Tests: No results for input(s): TSH, T4TOTAL, FREET4, T3FREE, THYROIDAB in the last 72 hours. Anemia Panel: No results for input(s): VITAMINB12, FOLATE, FERRITIN, TIBC, IRON , RETICCTPCT in the last 72 hours. Sepsis Labs: No results for input(s): PROCALCITON, LATICACIDVEN in the last 168 hours.  No results found  for this or any previous visit (from the past 240 hours).   Radiology Studies: DG CHEST PORT 1 VIEW Result Date: 07/04/2024 EXAM: 1 VIEW(S) XRAY OF THE CHEST 07/04/2024 06:15:00 AM COMPARISON: 06/22/2024 CLINICAL HISTORY: Volume excess FINDINGS: LUNGS AND PLEURA: Mild diffuse interstitial prominence. No focal pulmonary opacity. No pulmonary edema. No pleural effusion. No pneumothorax. HEART AND MEDIASTINUM: Stable cardiomegaly. No acute abnormality of the mediastinal silhouette. BONES AND SOFT TISSUES: VP shunt tubing overlies right chest. Surgical clips in right upper chest. No acute osseous abnormality. IMPRESSION: 1. Mild diffuse interstitial prominence suggesting subtle vascular congestion similar to prior . 2. Stable cardiomegaly. Electronically signed by: Norman Gatlin MD 07/04/2024 01:31 PM EDT RP Workstation: [id number]     Scheduled Meds:  amLODipine   10 mg Oral Once per day on Sunday Tuesday Thursday Saturday   carvedilol   25 mg Per Tube 2 times per day on Sunday Tuesday Thursday Saturday   Chlorhexidine  Gluconate Cloth  6 each Topical Q0600   cinacalcet   30 mg Oral Q M,W,F   cloNIDine   0.3 mg Per Tube Once per day on Sunday Tuesday Thursday Saturday   cycloSPORINE   200 mg Per Tube QPM   cycloSPORINE   225 mg  Per Tube Daily   darbepoetin (ARANESP ) injection - DIALYSIS  150 mcg Subcutaneous Q Thu-1800   [START ON 07/06/2024] feeding supplement (NEPRO CARB STEADY)  1,000 mL Per Tube Q24H   fiber  1 packet Per Tube TID   gabapentin   100 mg Per Tube Daily   insulin  aspart  0-9 Units Subcutaneous Q4H   lacosamide   100 mg Per Tube BID   liver oil-zinc  oxide   Topical TID   multivitamin with minerals  1 tablet Per Tube Daily   mycophenolate   500 mg Per Tube BID   mouth rinse  15 mL Mouth Rinse 4 times per day   oxidized cellulose  1 each Topical Once   pantoprazole  (PROTONIX ) IV  40 mg Intravenous QHS   predniSONE   15 mg Per Tube Q breakfast   Continuous Infusions:     LOS: 47 days   Elvan Sor, MD [name]  07/05/2024, 1:59 PM   *Please note that this is a verbal dictation therefore any spelling or grammatical errors are due to the Dragon Medical One system interpretation.  Please page via Amion and do not message via secure chat for urgent patient care matters. Secure chat can be used for non urgent patient care matters.  How to contact the [facility] Attending or Consulting provider 7A - 7P or covering provider during after hours 7P -7A, for this patient?  Check the care team in [facility] and look for a) attending/consulting [facility] provider listed and b) the [facility] team listed. Page or secure chat 7A-7P. Log into [email or web address] and use 's universal password to access. If you do not have the password, please contact the hospital operator. Locate the [facility] provider you are looking for under [name] and page to a number that you can be directly reached. If you still have difficulty reaching the provider, please page the [facility] (Director on Call) for the Hospitalists listed on amion for assistance.

## 2024-07-05 NOTE — Progress Notes (Signed)
 [facility] Progress Note   Subjective:   Pt's mom reports she had an awful night due to pain from rectal tube, and just went to sleep this morning. We did not wake her. No SOB reported.   Objective Vitals:   07/04/24 1610 07/04/24 2021 07/04/24 2325 07/05/24 0359  BP: (!) 100/59 107/65 (!) 135/52 (!) 128/59  Pulse: 87 86 90 81  Resp: 18 20 16 17   Temp: (!) 97.4 F (36.3 C) 98.6 F (37 C) 97.6 F (36.4 C) 98.1 F (36.7 C)  TempSrc: Oral Oral Oral Oral  SpO2: 100% 97% 100% 100%  Weight:      Height:       Physical Exam General: sleeping female in NAD Lungs: CTA bilaterally, respirations unlabored Abdomen: Soft, non-distended Extremities: No edema b/l lower extremities Dialysis Access:  RUE AVF   Additional Objective Labs: Basic Metabolic Panel: Recent Labs  Lab 06/30/24 0901 06/30/24 1030 07/02/24 0842  NA 118* 124* 125*  K 4.4 3.8 3.9  CL 78* 85* 84*  CO2 22 25 26   GLUCOSE 121* 110* 134*  BUN 89* 56* 65*  CREATININE 5.24* 3.48* 4.56*  CALCIUM  9.9 9.5 10.1  PHOS 4.4 2.6 4.3   Liver Function Tests: Recent Labs  Lab 06/30/24 0901 06/30/24 1030 07/02/24 0842  ALBUMIN  3.1* 3.1* 2.9*   No results for input(s): LIPASE, AMYLASE in the last 168 hours. CBC: Recent Labs  Lab 06/30/24 0901 07/02/24 0842  WBC 4.6 5.1  HGB 7.9* 7.6*  HCT 22.5* 22.5*  MCV 90.4 93.4  PLT 125* 144*   Blood Culture    Component Value Date/Time   SDES CSF 06/11/2024 0920   SPECREQUEST LP 06/11/2024 0920   CULT  06/11/2024 0920    NO GROWTH 3 DAYS Performed at [facility], [address] [place], [name] [address]    REPTSTATUS 06/14/2024 FINAL 06/11/2024 0920    Cardiac Enzymes: No results for input(s): CKTOTAL, CKMB, CKMBINDEX, TROPONINI in the last 168 hours. CBG: Recent Labs  Lab 07/04/24 1608 07/04/24 2038 07/05/24 0022 07/05/24 0401 07/05/24 0858  GLUCAP 129* 107* 129* 98 115*   Iron  Studies: No results for input(s):  IRON , TIBC, TRANSFERRIN, FERRITIN in the last 72 hours. @lablastinr3 @ Studies/Results: DG CHEST PORT 1 VIEW Result Date: 07/04/2024 EXAM: 1 VIEW(S) XRAY OF THE CHEST 07/04/2024 06:15:00 AM COMPARISON: 06/22/2024 CLINICAL HISTORY: Volume excess FINDINGS: LUNGS AND PLEURA: Mild diffuse interstitial prominence. No focal pulmonary opacity. No pulmonary edema. No pleural effusion. No pneumothorax. HEART AND MEDIASTINUM: Stable cardiomegaly. No acute abnormality of the mediastinal silhouette. BONES AND SOFT TISSUES: VP shunt tubing overlies right chest. Surgical clips in right upper chest. No acute osseous abnormality. IMPRESSION: 1. Mild diffuse interstitial prominence suggesting subtle vascular congestion similar to prior . 2. Stable cardiomegaly. Electronically signed by: Norman Gatlin MD 07/04/2024 01:31 PM EDT RP Workstation: [id number]   Medications:   amLODipine   10 mg Oral Once per day on Sunday Tuesday Thursday Saturday   carvedilol   25 mg Per Tube 2 times per day on Sunday Tuesday Thursday Saturday   Chlorhexidine  Gluconate Cloth  6 each Topical Q0600   cinacalcet   30 mg Oral Q M,W,F   cloNIDine   0.3 mg Per Tube Once per day on Sunday Tuesday Thursday Saturday   cycloSPORINE   200 mg Per Tube QPM   cycloSPORINE   225 mg Per Tube Daily   darbepoetin (ARANESP ) injection - DIALYSIS  150 mcg Subcutaneous Q Thu-1800   feeding supplement (NEPRO CARB STEADY)  1,000 mL Per Tube Q24H   fiber  1 packet Per Tube TID   gabapentin   100 mg Per Tube Daily   insulin  aspart  0-9 Units Subcutaneous Q4H   lacosamide   100 mg Per Tube BID   liver oil-zinc  oxide   Topical TID   multivitamin with minerals  1 tablet Per Tube Daily   mycophenolate   500 mg Per Tube BID   mouth rinse  15 mL Mouth Rinse 4 times per day   oxidized cellulose  1 each Topical Once   pantoprazole  (PROTONIX ) IV  40 mg Intravenous QHS   predniSONE   15 mg Per Tube Q breakfast    Assessment/Plan: 1.  Acute right intracranial  hemorrhage: Associated with uncontrolled hypertension and status post conversion from EVD to VP shunt on 06/18/2024 by neurosurgery.  Mental status waxing/waning and she continues to require total assistance for PT/OT.  Awaiting acceptance to LTAC after failure to meet CIR criteria. 2. ESRD following failure of renal allograft: Continue hemodialysis on Monday/Wednesday/Friday. 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  3. Anemia: 7.6- slowly drifting down.  She remains on Aranesp  - increased dose  4. CKD-MBD: With marginally elevated calcium  that is suspected to be largely from immobility.  Status post pamidronate on 06/15/2024 and started on Cinacalcet  30 mg 3 days a week. 5. Nutrition: Currently on tube feeds with Nepro along with renal MVI. Her K+ has not been elevated- if she is not tolerating nepro, ok to trial a non-renal tube feed 6. Hypertension/Volume: Intermittently elevated blood pressures noted on the current regimen, amlodipine  started recently and UF with HD, see below.  7. Hyponatremia: Repeat Na was better but still low. Increasing her UF goals this week. Appears labs were not drawn on HD yesterday, ordered a BMP today.   Lucie Collet, PA-C 07/05/2024, 10:35 AM  [facility] Pager: [phone]

## 2024-07-06 DIAGNOSIS — I619 Nontraumatic intracerebral hemorrhage, unspecified: Secondary | ICD-10-CM | POA: Diagnosis not present

## 2024-07-06 LAB — GLUCOSE, CAPILLARY
Glucose-Capillary: 117 mg/dL — ABNORMAL HIGH (ref 70–99)
Glucose-Capillary: 123 mg/dL — ABNORMAL HIGH (ref 70–99)
Glucose-Capillary: 180 mg/dL — ABNORMAL HIGH (ref 70–99)
Glucose-Capillary: 171 mg/dL — ABNORMAL HIGH (ref 70–99)

## 2024-07-06 MED ORDER — CHLORHEXIDINE GLUCONATE CLOTH 2 % EX PADS
6.0000 | MEDICATED_PAD | Freq: Every day | CUTANEOUS | Status: DC
Start: 2024-07-06 — End: 2024-07-18
  Administered 2024-07-06 – 2024-07-17 (×11): 6 via TOPICAL

## 2024-07-06 NOTE — Progress Notes (Signed)
 PROGRESS NOTE    [name] [name]  FMW:[id number] DOB: 1981/01/26 DOA: 05/19/2024 PCP: [facility]   Brief Narrative:  [name] with PMH HTN, ischemic and hemorrhagic CVA, type 1 diabetes, gastroparesis, kidney and pancreas transplant 2014, subsequent failure of renal transplant, now ESRD on dialysis, anxiety, narcotic abuse. Presented to the [facility] ED from dialysis as a code stroke on 9/15; subsequently found to have hemorrhagic stroke. Neurology, neurosurgery, PCCM consulted. EVD was placed on the left side. Patient admitted to neuro ICU. She was intubated on admission. She had prolonged ICU stay with EVD drain which was changed to VP shunt on 10/15.  Extubated on 9/23. Transferred to [facility] service on 10/17.     She was recently discharged from here in August after she was managed for ischemic/hemorrhagic stroke and was discharged to LTAC.    Important events:  9/15 admitted with ICH plus IVH, EVD was placed for hydrocephalus, intubated 9/22 palliative care meeting.  Family is insistent that they want full scope of care 9/23 Extubated 10/1 -developed fevers pancultures drawn and broad-spectrum antibiotics started 10/2 became afebrile after starting antibiotics, did not tolerate raising EVD 10/6 EVD was raised from 10 to 30 cm water , tolerating well, remained afebrile 10/7 EVD is at 30 cm water , CT scan will be done tomorrow morning, no overnight issues 10/8 she became somnolent, CT head showed increasing hydrocephalus, EVD was titrated down to 10 cm of water  with improvement in mental status 10/15 VP shunt placement 10/17 transferred to progressive unit with [name]   Assessment & Plan:   Principal Problem:   Hemorrhagic stroke [facility]) Active Problems:   Moderate protein-calorie malnutrition   Pressure injury of skin   Nontraumatic subcortical hemorrhage of right cerebral hemisphere [facility])  Acute right parietotemporal intraparenchymal  hemorrhage/intraventricular hemorrhage Uncontrolled hypertension Obstructive hydrocephalus s/p EVD changed to VP shunt Seizure disorder s/p VP shunt placement 06/18/2024 by Dr. Mavis. Remains stable on Vimpat  100 mg twice daily. Neurosurgery following. Per them, no issue after the shunt.   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 Patient on CellCept  and prednisone . Dialyzed on MWF schedule. Has AV fistula on the right upper extremity.   Acute bronchitis Resolved.    Intermittent low-grade fever: Thought to be secondary to intracranial hemorrhage. Previous blood cultures showed contamination. S/p antibiotics for respiratory cultures positive for Serratia and Klebsiella. Repeat cultures negative thus far. Patient afebrile for several days.   History of pancreatic transplantation: On cyclosporine , mycophenolate , prednisone    Hypertension:  Patient currently on Coreg , amlodipine  and clonidine  on nondialysis days.  Patient's blood pressure much better controlled.   Anemia of chronic disease: No signs of active bleeding. Hemoglobin stable   Type 1 diabetes with vasculopathy/gastroparesis: Not on long-term insulin  anymore due to pancreatic transplant. Monitor blood sugars. On sliding scale.   Moderate protein calorie malnutrition/dysphagia: Has PEG tube. Continue tube feeds.  SLP and dietitian following.    Stage I sacral pressure ulcer present on admission: Continue wound care   Debility/deconditioning/disposition/goals of care: Young patient with multiple comorbidities.  Palliative care consulted for goals of care conversations, family insist she remains full code.  Palliative has signed off 10/17.  Seen by PT OT.  They recommended CIR.  Reviewed by CIR, they recommend LTAC.  Peer to peer with insurance company was done by myself on 06/25/2024, and they denied LTAC.  They recommended LTC.  Appeal for LTAC denied again.  TOC  working on placing her  to LTC.  Diarrhea: Continue Imodium  as needed.  Flexi-Seal was inserted due to continuous loose stools.  DVT prophylaxis: SCDs Start: 05/19/24 1724   Code Status: Full Code  Family Communication: Discussed plan with mother at bedside.  Status is: Inpatient Remains inpatient appropriate because: Medically stable, pending placement to LTC,    Estimated body mass index is 20.89 kg/m as calculated from the following:   Height as of this encounter: 5' 3 (1.6 m).   Weight as of this encounter: 53.5 kg.  Wound 05/19/24 1933 Pressure Injury Buttocks Bilateral Stage 1 -  Intact skin with non-blanchable redness of a localized area usually over a bony prominence. (Active)   Nutritional Assessment: Body mass index is 20.89 kg/m.[name] Seen by dietician.  I agree with the assessment and plan as outlined below: Nutrition Status: Nutrition Problem: Moderate Malnutrition Etiology: chronic illness (ESRD on HD/DM and gastroparesis) Signs/Symptoms: severe muscle depletion, mild fat depletion Interventions: Tube feeding, Prostat, MVI  . Skin Assessment: I have examined the patient's skin and I agree with the wound assessment as performed by the wound care RN as outlined below: Wound 05/19/24 1933 Pressure Injury Buttocks Bilateral Stage 1 -  Intact skin with non-blanchable redness of a localized area usually over a bony prominence. (Active)    Consultants:  General surgery, palliative care, nephrology, neurology  Procedures:  As above  Antimicrobials:  Anti-infectives (From admission, onward)    Start     Dose/Rate Route Frequency Ordered Stop   06/18/24 2030  ceFAZolin  (ANCEF ) IVPB 2g/100 mL premix  Status:  Discontinued        2 g 200 mL/hr over 30 Minutes Intravenous Every 8 hours 06/18/24 1454 06/19/24 1608   06/18/24 0600  ceFAZolin  (ANCEF ) IVPB 2g/100 mL premix        2 g 200 mL/hr over 30 Minutes Intravenous On call to O.R. 06/15/24 1152 06/18/24 1230    06/06/24 1400  piperacillin -tazobactam (ZOSYN ) IVPB 2.25 g        2.25 g 100 mL/hr over 30 Minutes Intravenous Every 8 hours 06/06/24 1017 06/09/24 0538   06/06/24 1200  vancomycin  (VANCOREADY) IVPB 500 mg/100 mL        500 mg 100 mL/hr over 60 Minutes Intravenous Every M-W-F (Hemodialysis) 06/04/24 1745 06/06/24 1900   06/04/24 1845  vancomycin  (VANCOCIN ) IVPB 1000 mg/200 mL premix        1,000 mg 200 mL/hr over 60 Minutes Intravenous  Once 06/04/24 1745 06/04/24 1915   06/04/24 1830  ceFEPIme  (MAXIPIME ) 2 g in sodium chloride  0.9 % 100 mL IVPB  Status:  Discontinued        2 g 200 mL/hr over 30 Minutes Intravenous Every M-W-F (Hemodialysis) 06/04/24 1743 06/06/24 1017   05/27/24 1015  cefTRIAXone  (ROCEPHIN ) 2 g in sodium chloride  0.9 % 100 mL IVPB        2 g 200 mL/hr over 30 Minutes Intravenous Every 24 hours 05/27/24 0918 05/29/24 1014   05/23/24 1530  ceFEPIme  (MAXIPIME ) 1 g in sodium chloride  0.9 % 100 mL IVPB  Status:  Discontinued        1 g 200 mL/hr over 30 Minutes Intravenous Every 24 hours 05/23/24 1433 05/27/24 0918   05/22/24 2200  Ampicillin -Sulbactam (UNASYN ) 3 g in sodium chloride  0.9 % 100 mL IVPB  Status:  Discontinued        3 g 200 mL/hr over 30 Minutes Intravenous Every 24 hours 05/22/24 0834 05/23/24 1433   05/22/24 0930  Ampicillin -Sulbactam (UNASYN ) 3  g in sodium chloride  0.9 % 100 mL IVPB        3 g 200 mL/hr over 30 Minutes Intravenous  Once 05/22/24 0834 05/22/24 1900         Subjective: Patient was seen and examined at bedside during morning rounds. Patient was sleepy but woke up by calling her name.  Patient's mother was at bedside.  Management plan discussed.  She was concerned about source of the blood due to loose stools. Wound care consult placed.  Advised to turn patient every 2 hourly.   Objective: Vitals:   07/06/24 0415 07/06/24 0914 07/06/24 1001 07/06/24 1154  BP: (!) 110/56 (!) 157/68  (!) 118/59  Pulse: 76   74  Resp:  19 19 18   Temp:  98.3 F (36.8 C) 98.2 F (36.8 C)  98 F (36.7 C)  TempSrc: Oral Oral  Oral  SpO2: 97% 97%  98%  Weight:      Height:        Intake/Output Summary (Last 24 hours) at 07/06/2024 1610 Last data filed at 07/06/2024 9070 Gross per 24 hour  Intake 270 ml  Output 0 ml  Net 270 ml    Filed Weights   07/03/24 0500 07/04/24 0809 07/04/24 1214  Weight: 51 kg 55.5 kg 53.5 kg     Examination: General: Chronically ill middle-age woman appears in no acute distress. CV: RRR. No murmurs, rubs, or gallops. No LE edema Pulmonary: Anterior lung sounds clear to auscultation. Normal effort. Abdominal: Soft, nontender, nondistended. Normal bowel sounds. Extremities: RUE AVF with palpable thrill. Palpable pedal pulses. Skin: Warm and dry. No obvious rash or lesions. Neuro: Awake.  Moves all extremities. Normal sensation to light touch.    Data Reviewed: I have personally reviewed following labs and imaging studies  CBC: Recent Labs  Lab 06/30/24 0901 07/02/24 0842  WBC 4.6 5.1  HGB 7.9* 7.6*  HCT 22.5* 22.5*  MCV 90.4 93.4  PLT 125* 144*   Basic Metabolic Panel: Recent Labs  Lab 06/30/24 0901 06/30/24 1030 07/02/24 0842 07/05/24 1152  NA 118* 124* 125* 129*  K 4.4 3.8 3.9 4.3  CL 78* 85* 84* 88*  CO2 22 25 26 25   GLUCOSE 121* 110* 134* 169*  BUN 89* 56* 65* 49*  CREATININE 5.24* 3.48* 4.56* 3.12*  CALCIUM  9.9 9.5 10.1 9.9  PHOS 4.4 2.6 4.3  --    GFR: Estimated Creatinine Clearance: 19.4 mL/min (A) (by C-G formula based on SCr of 3.12 mg/dL (H)). Liver Function Tests: Recent Labs  Lab 06/30/24 0901 06/30/24 1030 07/02/24 0842  ALBUMIN  3.1* 3.1* 2.9*   No results for input(s): LIPASE, AMYLASE in the last 168 hours. No results for input(s): AMMONIA in the last 168 hours. Coagulation Profile: No results for input(s): INR, PROTIME in the last 168 hours.  Cardiac Enzymes: No results for input(s): CKTOTAL, CKMB, CKMBINDEX, TROPONINI in the last 168  hours. BNP (last 3 results) No results for input(s): PROBNP in the last 8760 hours. HbA1C: No results for input(s): HGBA1C in the last 72 hours. CBG: Recent Labs  Lab 07/05/24 1915 07/05/24 2358 07/06/24 0425 07/06/24 0852 07/06/24 1318  GLUCAP 140* 125* 117* 123* 180*   Lipid Profile: No results for input(s): CHOL, HDL, LDLCALC, TRIG, CHOLHDL, LDLDIRECT in the last 72 hours. Thyroid Function Tests: No results for input(s): TSH, T4TOTAL, FREET4, T3FREE, THYROIDAB in the last 72 hours. Anemia Panel: No results for input(s): VITAMINB12, FOLATE, FERRITIN, TIBC, IRON , RETICCTPCT in the last 72 hours.  Sepsis Labs: No results for input(s): PROCALCITON, LATICACIDVEN in the last 168 hours.  No results found for this or any previous visit (from the past 240 hours).   Radiology Studies: No results found.    Scheduled Meds:  amLODipine   10 mg Oral Once per day on Sunday Tuesday Thursday Saturday   carvedilol   25 mg Per Tube 2 times per day on Sunday Tuesday Thursday Saturday   Chlorhexidine  Gluconate Cloth  6 each Topical Q0600   cinacalcet   30 mg Oral Q M,W,F   cloNIDine   0.3 mg Per Tube Once per day on Sunday Tuesday Thursday Saturday   cycloSPORINE   200 mg Per Tube QPM   cycloSPORINE   225 mg Per Tube Daily   darbepoetin (ARANESP ) injection - DIALYSIS  150 mcg Subcutaneous Q Thu-1800   feeding supplement (NEPRO CARB STEADY)  1,000 mL Per Tube Q24H   fiber  1 packet Per Tube TID   gabapentin   100 mg Per Tube Daily   insulin  aspart  0-9 Units Subcutaneous Q4H   lacosamide   100 mg Per Tube BID   liver oil-zinc  oxide   Topical TID   multivitamin with minerals  1 tablet Per Tube Daily   mycophenolate   500 mg Per Tube BID   mouth rinse  15 mL Mouth Rinse 4 times per day   oxidized cellulose  1 each Topical Once   pantoprazole  (PROTONIX ) IV  40 mg Intravenous QHS   predniSONE   15 mg Per Tube Q breakfast   Continuous Infusions:      LOS: 48 days   Elvan Sor, MD [name]  07/06/2024, 4:10 PM   *Please note that this is a verbal dictation therefore any spelling or grammatical errors are due to the Dragon Medical One system interpretation.  Please page via Amion and do not message via secure chat for urgent patient care matters. Secure chat can be used for non urgent patient care matters.  How to contact the [facility] Attending or Consulting provider 7A - 7P or covering provider during after hours 7P -7A, for this patient?  Check the care team in [facility] and look for a) attending/consulting [facility] provider listed and b) the [facility] team listed. Page or secure chat 7A-7P. Log into [email or web address] and use [facility] universal password to access. If you do not have the password, please contact the hospital operator. Locate the [facility] provider you are looking for under [name] and page to a number that you can be directly reached. If you still have difficulty reaching the provider, please page the [facility] (Director on Call) for the Hospitalists listed on amion for assistance.

## 2024-07-06 NOTE — Progress Notes (Signed)
 [facility] Progress Note   Subjective:   Sleeping. Her mother reports she is still having a lot of rectal pain and asks about dermatology/wound care- looks like wound care did see her on 10/31.  Objective Vitals:   07/05/24 1631 07/05/24 2223 07/06/24 0003 07/06/24 0415  BP: (!) 105/53 (!) 100/55 (!) 107/58 (!) 110/56  Pulse: 75 72 71 76  Resp: 16  17   Temp: 97.7 F (36.5 C) 98 F (36.7 C) 98.2 F (36.8 C) 98.3 F (36.8 C)  TempSrc: Oral Oral Oral Oral  SpO2: 99%  99% 97%  Weight:      Height:       Physical Exam General: sleeping female in NAD Lungs: CTA bilaterally, respirations unlabored Cardiac: RRR Abdomen: Soft, non-distended Extremities: No edema b/l lower extremities Dialysis Access:  RUE AVF     Additional Objective Labs: Basic Metabolic Panel: Recent Labs  Lab 06/30/24 0901 06/30/24 1030 07/02/24 0842 07/05/24 1152  NA 118* 124* 125* 129*  K 4.4 3.8 3.9 4.3  CL 78* 85* 84* 88*  CO2 22 25 26 25   GLUCOSE 121* 110* 134* 169*  BUN 89* 56* 65* 49*  CREATININE 5.24* 3.48* 4.56* 3.12*  CALCIUM  9.9 9.5 10.1 9.9  PHOS 4.4 2.6 4.3  --    Liver Function Tests: Recent Labs  Lab 06/30/24 0901 06/30/24 1030 07/02/24 0842  ALBUMIN  3.1* 3.1* 2.9*   No results for input(s): LIPASE, AMYLASE in the last 168 hours. CBC: Recent Labs  Lab 06/30/24 0901 07/02/24 0842  WBC 4.6 5.1  HGB 7.9* 7.6*  HCT 22.5* 22.5*  MCV 90.4 93.4  PLT 125* 144*   Blood Culture    Component Value Date/Time   SDES CSF 06/11/2024 0920   SPECREQUEST LP 06/11/2024 0920   CULT  06/11/2024 0920    NO GROWTH 3 DAYS Performed at [facility], [address] [place], [name] [address]    REPTSTATUS 06/14/2024 FINAL 06/11/2024 0920    Cardiac Enzymes: No results for input(s): CKTOTAL, CKMB, CKMBINDEX, TROPONINI in the last 168 hours. CBG: Recent Labs  Lab 07/05/24 1242 07/05/24 1629 07/05/24 1915 07/05/24 2358 07/06/24 0425  GLUCAP  175* 151* 140* 125* 117*   Iron  Studies: No results for input(s): IRON , TIBC, TRANSFERRIN, FERRITIN in the last 72 hours. @lablastinr3 @ Studies/Results: No results found. Medications:   amLODipine   10 mg Oral Once per day on Sunday Tuesday Thursday Saturday   carvedilol   25 mg Per Tube 2 times per day on Sunday Tuesday Thursday Saturday   Chlorhexidine  Gluconate Cloth  6 each Topical Q0600   cinacalcet   30 mg Oral Q M,W,F   cloNIDine   0.3 mg Per Tube Once per day on Sunday Tuesday Thursday Saturday   cycloSPORINE   200 mg Per Tube QPM   cycloSPORINE   225 mg Per Tube Daily   darbepoetin (ARANESP ) injection - DIALYSIS  150 mcg Subcutaneous Q Thu-1800   feeding supplement (NEPRO CARB STEADY)  1,000 mL Per Tube Q24H   fiber  1 packet Per Tube TID   gabapentin   100 mg Per Tube Daily   insulin  aspart  0-9 Units Subcutaneous Q4H   lacosamide   100 mg Per Tube BID   liver oil-zinc  oxide   Topical TID   multivitamin with minerals  1 tablet Per Tube Daily   mycophenolate   500 mg Per Tube BID   mouth rinse  15 mL Mouth Rinse 4 times per day   oxidized cellulose  1 each  Topical Once   pantoprazole  (PROTONIX ) IV  40 mg Intravenous QHS   predniSONE   15 mg Per Tube Q breakfast    Assessment/Plan: 1.  Acute right intracranial hemorrhage: Associated with uncontrolled hypertension and status post conversion from EVD to VP shunt on 06/18/2024 by neurosurgery.  Mental status waxing/waning and she continues to require total assistance for PT/OT.  Awaiting acceptance to LTAC after failure to meet CIR criteria. 2. ESRD following failure of renal allograft: Continue hemodialysis on Monday/Wednesday/Friday. 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  3. Anemia: 7.6- slowly drifting down.  She remains on Aranesp  - increased dose this week 4. CKD-MBD: With marginally elevated  calcium  that is suspected to be largely from immobility.  Status post pamidronate on 06/15/2024 and started on Cinacalcet  30 mg 3 days a week. 5. Nutrition: Currently on tube feeds with Nepro along with renal MVI. Her K+ has not been elevated- if she is not tolerating nepro, ok to trial a non-renal tube feed 6. Hypertension/Volume: BP well controlled, volume removal for hyponatremia as below. Continue current antihypertensive medications.  7. Hyponatremia: Na improving with increased UF on HD, Na 129 yesterday.   Lucie Collet, PA-C 07/06/2024, 8:09 AM  [facility] Pager: [phone]

## 2024-07-06 NOTE — Consult Note (Signed)
 WOC Nurse Consult Note: Reason for Consult: pressure ulcers Wound type: ICD (irritant contact dermatitis) ICD-10 CM Codes for Irritant Dermatitis  L24A2 - Due to fecal, urinary or dual incontinence  Pressure Injury POA: NA Measurement: scattered areas around the anus related to incontinence  Wound bed: partial thickness skin loss Drainage (amount, consistency, odor) none Periwound: intact  Dressing procedure/placement/frequency: Zinc  based barrier cream to affected areas   Re consult if needed, will not follow at this time. Thanks  [employer], RN,CWOCN, CNS, [employer] [phone]

## 2024-07-07 DIAGNOSIS — I619 Nontraumatic intracerebral hemorrhage, unspecified: Secondary | ICD-10-CM | POA: Diagnosis not present

## 2024-07-07 LAB — BASIC METABOLIC PANEL WITH GFR
Anion gap: 20 — ABNORMAL HIGH (ref 5–15)
BUN: 98 mg/dL — ABNORMAL HIGH (ref 6–20)
CO2: 24 mmol/L (ref 22–32)
Calcium: 10.2 mg/dL (ref 8.9–10.3)
Chloride: 84 mmol/L — ABNORMAL LOW (ref 98–111)
Creatinine, Ser: 5.24 mg/dL — ABNORMAL HIGH (ref 0.44–1.00)
GFR, Estimated: 10 mL/min — ABNORMAL LOW (ref >60)
Glucose, Bld: 118 mg/dL — ABNORMAL HIGH (ref 70–99)
Potassium: 4.5 mmol/L (ref 3.5–5.1)
Sodium: 128 mmol/L — ABNORMAL LOW (ref 135–145)

## 2024-07-07 LAB — GLUCOSE, CAPILLARY
Glucose-Capillary: 137 mg/dL — ABNORMAL HIGH (ref 70–99)
Glucose-Capillary: 122 mg/dL — ABNORMAL HIGH (ref 70–99)
Glucose-Capillary: 135 mg/dL — ABNORMAL HIGH (ref 70–99)
Glucose-Capillary: 109 mg/dL — ABNORMAL HIGH (ref 70–99)

## 2024-07-07 LAB — CBC
HCT: 24.2 % — ABNORMAL LOW (ref 36.0–46.0)
Hemoglobin: 8 g/dL — ABNORMAL LOW (ref 12.0–15.0)
MCH: 31.4 pg (ref 26.0–34.0)
MCHC: 33.1 g/dL (ref 30.0–36.0)
MCV: 94.9 fL (ref 80.0–100.0)
Platelets: 162 K/uL (ref 150–400)
RBC: 2.55 MIL/uL — ABNORMAL LOW (ref 3.87–5.11)
RDW: 14.4 % (ref 11.5–15.5)
WBC: 3.8 K/uL — ABNORMAL LOW (ref 4.0–10.5)
nRBC: 0 % (ref 0.0–0.2)

## 2024-07-07 LAB — MAGNESIUM: Magnesium: 3.3 mg/dL — ABNORMAL HIGH (ref 1.7–2.4)

## 2024-07-07 LAB — PHOSPHORUS: Phosphorus: 4.1 mg/dL (ref 2.5–4.6)

## 2024-07-07 MED ORDER — OXYCODONE HCL 5 MG PO TABS
5.0000 mg | ORAL_TABLET | Freq: Four times a day (QID) | ORAL | Status: DC | PRN
  Administered 2024-07-07 – 2024-08-27 (×35): 5 mg via ORAL
  Filled 2024-07-07 (×35): qty 1

## 2024-07-07 MED ORDER — ONDANSETRON HCL 4 MG/2ML IJ SOLN
4.0000 mg | Freq: Four times a day (QID) | INTRAMUSCULAR | Status: DC | PRN
  Administered 2024-07-07 – 2024-08-12 (×17): 4 mg via INTRAVENOUS
  Filled 2024-07-07 (×16): qty 2

## 2024-07-07 NOTE — Progress Notes (Addendum)
 [facility] Progress Note   Subjective:   Seen on HD - 2L UFG and tolerating, not in recliner today - unclear why not, will d/w SW. No CP/dyspnea.  Objective Vitals:   07/07/24 0818 07/07/24 0820 07/07/24 0835 07/07/24 0905  BP: (!) 150/123 (!) 150/75 (!) 156/64 (!) 132/53  Pulse: 74 73 76 80  Resp: 16 14 13 15   Temp: 98.4 F (36.9 C)     TempSrc: Oral     SpO2: 97% 100% 99% 98%  Weight: 52 kg     Height:       Physical Exam General: Chronically ill appearing woman, NAD. Room air Heart: RRR, no murmur Lungs: CTA anteriorly Abdomen: soft Extremities: no LE edema Dialysis Access: L AVF +t/b  Additional Objective Labs: Basic Metabolic Panel: Recent Labs  Lab 06/30/24 1030 07/02/24 0842 07/05/24 1152 07/07/24 0220  NA 124* 125* 129* 128*  K 3.8 3.9 4.3 4.5  CL 85* 84* 88* 84*  CO2 25 26 25 24   GLUCOSE 110* 134* 169* 118*  BUN 56* 65* 49* 98*  CREATININE 3.48* 4.56* 3.12* 5.24*  CALCIUM  9.5 10.1 9.9 10.2  PHOS 2.6 4.3  --  4.1   Liver Function Tests: Recent Labs  Lab 06/30/24 1030 07/02/24 0842  ALBUMIN  3.1* 2.9*   CBC: Recent Labs  Lab 07/02/24 0842 07/07/24 0220  WBC 5.1 3.8*  HGB 7.6* 8.0*  HCT 22.5* 24.2*  MCV 93.4 94.9  PLT 144* 162   Medications:   amLODipine   10 mg Oral Once per day on Sunday Tuesday Thursday Saturday   carvedilol   25 mg Per Tube 2 times per day on Sunday Tuesday Thursday Saturday   Chlorhexidine  Gluconate Cloth  6 each Topical Q0600   cinacalcet   30 mg Oral Q M,W,F   cloNIDine   0.3 mg Per Tube Once per day on Sunday Tuesday Thursday Saturday   cycloSPORINE   200 mg Per Tube QPM   cycloSPORINE   225 mg Per Tube Daily   darbepoetin (ARANESP ) injection - DIALYSIS  150 mcg Subcutaneous Q Thu-1800   feeding supplement (NEPRO CARB STEADY)  1,000 mL Per Tube Q24H   fiber  1 packet Per Tube TID   gabapentin   100 mg Per Tube Daily   insulin  aspart  0-9 Units Subcutaneous Q4H   lacosamide   100 mg Per Tube BID    liver oil-zinc  oxide   Topical TID   multivitamin with minerals  1 tablet Per Tube Daily   mycophenolate   500 mg Per Tube BID   mouth rinse  15 mL Mouth Rinse 4 times per day   oxidized cellulose  1 each Topical Once   pantoprazole  (PROTONIX ) IV  40 mg Intravenous QHS   predniSONE   15 mg Per Tube Q breakfast    Dialysis Orders: MWF - NW 3:45hr, 400/A1.5, EDW 55.2kg, 2K/2Ca bath, AVF, no heparin  - Prev on Mircera 150mcg IV q 2 weeks - No VDRA  Assessment/Plan: Acute R intracranial hemorrhage: 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 Needs ongoing rehab -> to LTAC soon per ntoes. ESRD: Continue HD on MWF schedule - HD now, 2L UFG. No heparin . HTN/volume: BP variable, up at times, no LE edema, UF as tolerated. Continue home meds. Anemia of ESRD: Hgb 8 - continue Aranesp  150mcg q Thursday, consider ^ next dose. Secondary HPTH: CorrCa high, Phos ok. Continue sensipar , not on VDRA. Nutrition: Alb low, continue Nepro Tfs. 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 Hx T1D/gastroparesis   Izetta Boehringer,  PA-C 07/07/2024, 9:11 AM  [facility]    Seen and examined independently.  Agree with note and exam as documented above by Izetta Boehringer, PA and as noted here.  Please see my separate procedure note from today  Katheryn JAYSON Saba, MD 07/07/2024  10:18 AM

## 2024-07-07 NOTE — Progress Notes (Signed)
 PT Cancellation Note  Patient Details Name: [name] [name] MRN: [id number] DOB: 1980/12/27   Cancelled Treatment:    Reason Eval/Treat Not Completed: (P) Patient at procedure or test/unavailable (Pt off floor at HD. Will follow up as able.)   Darryle George 07/07/2024, 11:11 AM

## 2024-07-07 NOTE — Progress Notes (Addendum)
   07/07/24 1205  Vitals  Temp 97.8 F (36.6 C)  Pulse Rate 82  Resp 18  BP (!) 149/61  SpO2 100 %  O2 Device Room Air  Type of Weight Post-Dialysis  Oxygen  Therapy  Patient Activity (if Appropriate) In bed  Pulse Oximetry Type Continuous  Post Treatment  Dialyzer Clearance Lightly streaked  Liters Processed 83.1  Fluid Removed (mL) 0.8 mL  Tolerated HD Treatment Yes  Post-Hemodialysis Comments Pt. has pain on the left leg   0.8L

## 2024-07-07 NOTE — Procedures (Signed)
 Seen and examined on dialysis.  Blood pressure 108/52 and HR 81.  Tolerating goal.  Procedure supervised. Right AVF in use.   Katheryn JAYSON Saba, MD 07/07/2024  9:47 AM

## 2024-07-07 NOTE — Plan of Care (Signed)
  Problem: Education: Goal: Ability to describe self-care measures that may prevent or decrease complications (Diabetes Survival Skills Education) will improve Outcome: Progressing   Problem: Coping: Goal: Ability to adjust to condition or change in health will improve Outcome: Progressing   Problem: Health Behavior/Discharge Planning: Goal: Ability to identify and utilize available resources and services will improve Outcome: Progressing

## 2024-07-07 NOTE — Progress Notes (Signed)
 [facility] night cross cover note:   I added prn iv zofran  for nausea after patient become nauseous following receipt of meds through her PEG tube this evening.     Eva Pore, DO Hospitalist

## 2024-07-07 NOTE — Plan of Care (Signed)
  Problem: Education: Goal: Ability to describe self-care measures that may prevent or decrease complications (Diabetes Survival Skills Education) will improve 07/07/2024 1842 by Jobie Dena RAMAN, RN Outcome: Progressing 07/07/2024 1728 by Jobie Dena RAMAN, RN Outcome: Progressing   Problem: Coping: Goal: Ability to adjust to condition or change in health will improve 07/07/2024 1842 by Jobie Dena RAMAN, RN Outcome: Progressing 07/07/2024 1728 by Jobie Dena RAMAN, RN Outcome: Progressing   Problem: Fluid Volume: Goal: Ability to maintain a balanced intake and output will improve 07/07/2024 1842 by Jobie Dena RAMAN, RN Outcome: Progressing 07/07/2024 1728 by Jobie Dena RAMAN, RN Outcome: Progressing

## 2024-07-07 NOTE — Progress Notes (Signed)
 PROGRESS NOTE    [name] [name]  FMW:[id number] DOB: Mar 15, 1981 DOA: 05/19/2024 PCP: [facility]   Brief Narrative:  This 43-yrs-old female with PMH significant for HTN, Ischemic and hemorrhagic CVA, type 1 diabetes, gastroparesis, kidney and pancreas transplant 2014, subsequent failure of renal transplant, now ESRD on dialysis, anxiety, narcotic abuse. Presented to the [facility] ED from dialysis as code stroke on 9/15; subsequently found to have hemorrhagic stroke. Neurology, Neurosurgery, PCCM consulted. EVD was placed on the left side. Patient admitted to neuro ICU. She was intubated on admission. She had prolonged ICU stay with EVD drain which was changed to VP shunt on 10/15.  Extubated on 9/23.  Transferred to [facility] service on 10/17.     She was recently discharged from here in August after she was managed for ischemic/hemorrhagic stroke and was discharged to LTAC.   Important events:  9/15 admitted with ICH plus IVH, EVD was placed for hydrocephalus, intubated 9/22 palliative care meeting.  Family is insistent that they want full scope of care 9/23 Extubated 10/1 -developed fevers pancultures drawn and broad-spectrum antibiotics started 10/2 became afebrile after starting antibiotics, did not tolerate raising EVD 10/6 EVD was raised from 10 to 30 cm water , tolerating well, remained afebrile 10/7 EVD is at 30 cm water , CT scan will be done tomorrow morning, no overnight issues 10/8 she became somnolent, CT head showed increasing hydrocephalus, EVD was titrated down to 10 cm of water  with improvement in mental status 10/15 VP shunt placement 10/17 transferred to progressive unit with [name]     Assessment & Plan:   Principal Problem:   Hemorrhagic stroke [facility]) Active Problems:   Moderate protein-calorie malnutrition   Pressure injury of skin   Nontraumatic subcortical hemorrhage of right cerebral hemisphere [facility])   Acute right parietotemporal intraparenchymal  hemorrhage / Intraventricular hemorrhage Uncontrolled hypertension Obstructive hydrocephalus s/p EVD changed to VP shunt: Seizure disorder s/p VP shunt placement on 06/18/2024 by Dr. Mavis.  Remains stable on Vimpat  100 mg twice daily.  Neurosurgery following. Per them, no issue after the shunt.   ESRD on dialysis: History of failed renal transplant: Secondary hyperparathyroidism: Nephrology following. Patient on CellCept  and prednisone .  Dialyzed on MWF schedule. Has AV fistula on the right upper extremity.   Acute bronchitis: Resolved.    Intermittent low-grade fever:  Thought to be secondary to intracranial hemorrhage. Previous blood cultures showed contamination.  S/p antibiotics for respiratory cultures positive for Serratia and Klebsiella.  Repeat cultures negative thus far. Patient afebrile for several days.   History of pancreatic transplantation:  Continue cyclosporine , mycophenolate , prednisone .   Hypertension:  Patient currently on Coreg , amlodipine  and clonidine  on nondialysis days.   Patient's blood pressure much better controlled.   Anemia of chronic disease: No signs of active bleeding. Hemoglobin stable.   Type 1 diabetes with vasculopathy/gastroparesis:  Not on long-term insulin  anymore due to pancreatic transplant.  Monitor blood sugars. On sliding scale.   Moderate protein calorie malnutrition/dysphagia:  Has PEG tube. Continue tube feeds.  SLP and dietitian following.    Stage I sacral pressure ulcer present on admission: Continue wound care   Debility/deconditioning/disposition/goals of care:  Young patient with multiple comorbidities.  Palliative care consulted for goals of care conversations, family insist she remains full code.  Palliative has signed off 10/17.  Seen by PT OT.  They recommended CIR.  Reviewed by CIR, they recommend LTAC.  Peer to peer with insurance company was done on 06/25/2024, and they denied LTAC.  They  recommended LTC.  Appeal  for LTAC denied again.  TOC working on placing her to LTC.   Diarrhea: Continue Imodium  as needed.  Flexi-Seal was inserted due to continuous loose stools.   DVT prophylaxis: SCDs Code Status: Full code Family Communication:Mother at bed side Disposition Plan:    Status is: Inpatient Remains inpatient appropriate because: Severity of illness.   Consultants:  Nephrology  Procedures:  Antimicrobials:  Anti-infectives (From admission, onward)    Start     Dose/Rate Route Frequency Ordered Stop   06/18/24 2030  ceFAZolin  (ANCEF ) IVPB 2g/100 mL premix  Status:  Discontinued        2 g 200 mL/hr over 30 Minutes Intravenous Every 8 hours 06/18/24 1454 06/19/24 1608   06/18/24 0600  ceFAZolin  (ANCEF ) IVPB 2g/100 mL premix        2 g 200 mL/hr over 30 Minutes Intravenous On call to O.R. 06/15/24 1152 06/18/24 1230   06/06/24 1400  piperacillin -tazobactam (ZOSYN ) IVPB 2.25 g        2.25 g 100 mL/hr over 30 Minutes Intravenous Every 8 hours 06/06/24 1017 06/09/24 0538   06/06/24 1200  vancomycin  (VANCOREADY) IVPB 500 mg/100 mL        500 mg 100 mL/hr over 60 Minutes Intravenous Every M-W-F (Hemodialysis) 06/04/24 1745 06/06/24 1900   06/04/24 1845  vancomycin  (VANCOCIN ) IVPB 1000 mg/200 mL premix        1,000 mg 200 mL/hr over 60 Minutes Intravenous  Once 06/04/24 1745 06/04/24 1915   06/04/24 1830  ceFEPIme  (MAXIPIME ) 2 g in sodium chloride  0.9 % 100 mL IVPB  Status:  Discontinued        2 g 200 mL/hr over 30 Minutes Intravenous Every M-W-F (Hemodialysis) 06/04/24 1743 06/06/24 1017   05/27/24 1015  cefTRIAXone  (ROCEPHIN ) 2 g in sodium chloride  0.9 % 100 mL IVPB        2 g 200 mL/hr over 30 Minutes Intravenous Every 24 hours 05/27/24 0918 05/29/24 1014   05/23/24 1530  ceFEPIme  (MAXIPIME ) 1 g in sodium chloride  0.9 % 100 mL IVPB  Status:  Discontinued        1 g 200 mL/hr over 30 Minutes Intravenous Every 24 hours 05/23/24 1433 05/27/24 0918   05/22/24 2200  Ampicillin -Sulbactam  (UNASYN ) 3 g in sodium chloride  0.9 % 100 mL IVPB  Status:  Discontinued        3 g 200 mL/hr over 30 Minutes Intravenous Every 24 hours 05/22/24 0834 05/23/24 1433   05/22/24 0930  Ampicillin -Sulbactam (UNASYN ) 3 g in sodium chloride  0.9 % 100 mL IVPB        3 g 200 mL/hr over 30 Minutes Intravenous  Once 05/22/24 0834 05/22/24 1900      Subjective: Patient was seen and examined at bedside. Overnight events noted. Patient was having hemodialysis and states she is feeling better,  reports having pain  Objective: Vitals:   07/07/24 1105 07/07/24 1115 07/07/24 1158 07/07/24 1205  BP: (!) 99/52 (!) 99/52 (!) 147/59 (!) 149/61  Pulse: 80 80 77 82  Resp: 18 15 16 18   Temp:    97.8 F (36.6 C)  TempSrc:      SpO2: 99% 100% 100% 100%  Weight:      Height:        Intake/Output Summary (Last 24 hours) at 07/07/2024 1455 Last data filed at 07/07/2024 1205 Gross per 24 hour  Intake --  Output 800 ml  Net -800 ml   [employer]  07/04/24 0809 07/04/24 1214 07/07/24 0818  Weight: 55.5 kg 53.5 kg 52 kg    Examination:  General exam: Appears calm and comfortable, not in any acute distress. Respiratory system: CTA Bilaterally. Respiratory effort normal. RR 15 Cardiovascular system: S1 & S2 heard, RRR. No JVD, murmurs, rubs, gallops or clicks.  Gastrointestinal system: Abdomen is nondistended, soft and nontender. Normal bowel sounds heard. Central nervous system: Alert and oriented x 3. No focal neurological deficits. Extremities: No edema, No cyanosis, No clubbing Skin: No rashes, lesions or ulcers Psychiatry: Judgement and insight appear normal. Mood & affect appropriate.     Data Reviewed: I have personally reviewed following labs and imaging studies  CBC: Recent Labs  Lab 07/02/24 0842 07/07/24 0220  WBC 5.1 3.8*  HGB 7.6* 8.0*  HCT 22.5* 24.2*  MCV 93.4 94.9  PLT 144* 162   Basic Metabolic Panel: Recent Labs  Lab 07/02/24 0842 07/05/24 1152 07/07/24 0220  NA  125* 129* 128*  K 3.9 4.3 4.5  CL 84* 88* 84*  CO2 26 25 24   GLUCOSE 134* 169* 118*  BUN 65* 49* 98*  CREATININE 4.56* 3.12* 5.24*  CALCIUM  10.1 9.9 10.2  MG  --   --  3.3*  PHOS 4.3  --  4.1   GFR: Estimated Creatinine Clearance: 11.5 mL/min (A) (by C-G formula based on SCr of 5.24 mg/dL (H)). Liver Function Tests: Recent Labs  Lab 07/02/24 0842  ALBUMIN  2.9*   No results for input(s): LIPASE, AMYLASE in the last 168 hours. No results for input(s): AMMONIA in the last 168 hours. Coagulation Profile: No results for input(s): INR, PROTIME in the last 168 hours. Cardiac Enzymes: No results for input(s): CKTOTAL, CKMB, CKMBINDEX, TROPONINI in the last 168 hours. BNP (last 3 results) No results for input(s): PROBNP in the last 8760 hours. HbA1C: No results for input(s): HGBA1C in the last 72 hours. CBG: Recent Labs  Lab 07/06/24 0852 07/06/24 1318 07/06/24 1640 07/07/24 0426 07/07/24 0729  GLUCAP 123* 180* 171* 137* 122*   Lipid Profile: No results for input(s): CHOL, HDL, LDLCALC, TRIG, CHOLHDL, LDLDIRECT in the last 72 hours. Thyroid Function Tests: No results for input(s): TSH, T4TOTAL, FREET4, T3FREE, THYROIDAB in the last 72 hours. Anemia Panel: No results for input(s): VITAMINB12, FOLATE, FERRITIN, TIBC, IRON , RETICCTPCT in the last 72 hours. Sepsis Labs: No results for input(s): PROCALCITON, LATICACIDVEN in the last 168 hours.  No results found for this or any previous visit (from the past 240 hours).  Radiology Studies: No results found. Scheduled Meds:  amLODipine   10 mg Oral Once per day on Sunday Tuesday Thursday Saturday   carvedilol   25 mg Per Tube 2 times per day on Sunday Tuesday Thursday Saturday   Chlorhexidine  Gluconate Cloth  6 each Topical Q0600   cinacalcet   30 mg Oral Q M,W,F   cloNIDine   0.3 mg Per Tube Once per day on Sunday Tuesday Thursday Saturday   cycloSPORINE   200 mg Per Tube  QPM   cycloSPORINE   225 mg Per Tube Daily   darbepoetin (ARANESP ) injection - DIALYSIS  150 mcg Subcutaneous Q Thu-1800   feeding supplement (NEPRO CARB STEADY)  1,000 mL Per Tube Q24H   fiber  1 packet Per Tube TID   gabapentin   100 mg Per Tube Daily   insulin  aspart  0-9 Units Subcutaneous Q4H   lacosamide   100 mg Per Tube BID   liver oil-zinc  oxide   Topical TID   multivitamin with minerals  1 tablet  Per Tube Daily   mycophenolate   500 mg Per Tube BID   mouth rinse  15 mL Mouth Rinse 4 times per day   oxidized cellulose  1 each Topical Once   pantoprazole  (PROTONIX ) IV  40 mg Intravenous QHS   predniSONE   15 mg Per Tube Q breakfast   Continuous Infusions:   LOS: 49 days    Time spent: 50 mins    Darcel Dawley, MD [name]   If 7PM-7AM, please contact night-coverage

## 2024-07-07 NOTE — Progress Notes (Signed)
 Subjective: The patient is alert and pleasant.  Her mother is at the bedside.  The patient complains of pain in the region of her buttocks secondary to her diarrhea.  She continues to have a tendency to pick and scratch at her scalp incision.  Objective: Vital signs in last 24 hours: Temp:  [97.6 F (36.4 C)-98.7 F (37.1 C)] 97.6 F (36.4 C) (11/03 0724) Pulse Rate:  [73-76] 76 (11/03 0724) Resp:  [16-19] 16 (11/03 0724) BP: (109-158)/(33-68) 158/64 (11/03 0724) SpO2:  [96 %-100 %] 100 % (11/03 0724) Estimated body mass index is 20.89 kg/m as calculated from the following:   Height as of this encounter: 5' 3 (1.6 m).   Weight as of this encounter: 53.5 kg.   Intake/Output from previous day: 11/02 0701 - 11/03 0700 In: 150  Out: 0  Intake/Output this shift: No intake/output data recorded.  Physical exam the patient is alert and pleasant.  She is speaking more.  She moves all 4 extremities.  Her scalp incision is healing well with a step-off.  Lab Results: Recent Labs    07/07/24 0220  WBC 3.8*  HGB 8.0*  HCT 24.2*  PLT 162   BMET Recent Labs    07/05/24 1152 07/07/24 0220  NA 129* 128*  K 4.3 4.5  CL 88* 84*  CO2 25 24  GLUCOSE 169* 118*  BUN 49* 98*  CREATININE 3.12* 5.24*  CALCIUM  9.9 10.2    Studies/Results: No results found.  Assessment/Plan: Intracerebral hemorrhage, hydrocephalus, status post ventriculoperitoneal shunt: The patient is doing well neurologically.  I have again encouraged her to stop scratching/picking at her wound.  We will keep it covered to protect it.  Diarrhea, buttocks wounds: The wound nurse is going to see the patient.  Perhaps she could pass a swallowing eval and get off the tube feeds.  LOS: 49 days     [name] 07/07/2024, 7:50 AM     Patient ID: [name] [name], female   DOB: 01-Sep-1981, 43 y.o.   MRN: [id number]

## 2024-07-07 NOTE — Progress Notes (Signed)
 Pt's mom requested for wound care to re-evaluate patients peri wound. Pt's mom has a relative [place] that is a physician and she sent a picture of the area to them and he sent back some recommendations for nifucin powder, mebo-scar cream, fucidin tablet 500mg  x2. All info relayed to physician and wound care was reconsulted. Melody, wound care RN, was called and a voicemail was left.

## 2024-07-07 NOTE — Consult Note (Signed)
 WOC Nurse wound follow up Wound type: partial thickness perineal MASD. Re-evaluated in person per patient's mom request. Discussed with her mother the alternatives, considering a relative opinion who is a doctor. Measurement: 2 open wounds 2 cm x 2 cm each side of the perineal raphe.. Wound bed: 100% pale pink. Drainage (amount, consistency, odor) Minimum amount from the wound bed, liquid stool present. Periwound: intact, inflammatory process in the wound edges. Dressing procedure/placement/frequency: Cleanse with cleanser or warm water , pat dry. Apply Fungal powder twice a day, top with barrier film wipes.  WOC team will not plan to follow further. Please reconsult if further assistance is needed. Thank-you,  Lela Holm MSN, RN, CNS.  (Phone [phone])

## 2024-07-07 NOTE — Progress Notes (Signed)
 0Pt. Came in on bed, assisted by porter, alert, and oriented with no complaints. Consent signed and on file. Received report from floor nurse.  Pt. Started with no complaints and no complications  Tx duration: 3.5 hours. UF removal: 0.8L NSS Bolus:  Access used: Right AVF   Pt. Completed tx with complaints of pain on the left leg. AP doesn't have any further orders Pressure dressing applied Endorsed to floor nurse Transported pt. To room.   [name] [name] Rubi B. Zoltan Genest, RN [facility]

## 2024-07-08 DIAGNOSIS — I619 Nontraumatic intracerebral hemorrhage, unspecified: Secondary | ICD-10-CM | POA: Diagnosis not present

## 2024-07-08 LAB — GLUCOSE, CAPILLARY
Glucose-Capillary: 105 mg/dL — ABNORMAL HIGH (ref 70–99)
Glucose-Capillary: 107 mg/dL — ABNORMAL HIGH (ref 70–99)
Glucose-Capillary: 125 mg/dL — ABNORMAL HIGH (ref 70–99)
Glucose-Capillary: 127 mg/dL — ABNORMAL HIGH (ref 70–99)
Glucose-Capillary: 131 mg/dL — ABNORMAL HIGH (ref 70–99)
Glucose-Capillary: 158 mg/dL — ABNORMAL HIGH (ref 70–99)
Glucose-Capillary: 172 mg/dL — ABNORMAL HIGH (ref 70–99)

## 2024-07-08 LAB — BASIC METABOLIC PANEL WITH GFR
Anion gap: 14 (ref 5–15)
BUN: 44 mg/dL — ABNORMAL HIGH (ref 6–20)
CO2: 30 mmol/L (ref 22–32)
Calcium: 10 mg/dL (ref 8.9–10.3)
Chloride: 89 mmol/L — ABNORMAL LOW (ref 98–111)
Creatinine, Ser: 3.04 mg/dL — ABNORMAL HIGH (ref 0.44–1.00)
GFR, Estimated: 19 mL/min — ABNORMAL LOW (ref >60)
Glucose, Bld: 108 mg/dL — ABNORMAL HIGH (ref 70–99)
Potassium: 4.1 mmol/L (ref 3.5–5.1)
Sodium: 133 mmol/L — ABNORMAL LOW (ref 135–145)

## 2024-07-08 LAB — CBC
HCT: 25.4 % — ABNORMAL LOW (ref 36.0–46.0)
Hemoglobin: 8.2 g/dL — ABNORMAL LOW (ref 12.0–15.0)
MCH: 31.2 pg (ref 26.0–34.0)
MCHC: 32.3 g/dL (ref 30.0–36.0)
MCV: 96.6 fL (ref 80.0–100.0)
Platelets: 161 K/uL (ref 150–400)
RBC: 2.63 MIL/uL — ABNORMAL LOW (ref 3.87–5.11)
RDW: 14.7 % (ref 11.5–15.5)
WBC: 4.9 K/uL (ref 4.0–10.5)
nRBC: 0 % (ref 0.0–0.2)

## 2024-07-08 LAB — MAGNESIUM: Magnesium: 2.7 mg/dL — ABNORMAL HIGH (ref 1.7–2.4)

## 2024-07-08 LAB — PHOSPHORUS: Phosphorus: 3.8 mg/dL (ref 2.5–4.6)

## 2024-07-08 MED ORDER — FLUCONAZOLE 150 MG PO TABS
150.0000 mg | ORAL_TABLET | Freq: Once | ORAL | Status: AC
  Administered 2024-07-08: 150 mg via ORAL
  Filled 2024-07-08: qty 1

## 2024-07-08 MED ORDER — FAMOTIDINE 20 MG PO TABS
10.0000 mg | ORAL_TABLET | Freq: Every day | ORAL | Status: DC
  Administered 2024-07-08 – 2024-08-07 (×31): 10 mg
  Filled 2024-07-08 (×32): qty 1

## 2024-07-08 NOTE — Progress Notes (Signed)
 Pt's case discussed with team this morning. Pt will need to be able to tolerate sitting for HD to be appropriate for out-pt HD at d/c. Last week, therapy staff mentioned pt had been sliding down in chair when pt was placed in chair at bedside.  Contacted team to inquire if it would appropriate for pt to sit in chair at bedside today to see how pt is able to tolerating sitting for an extended period of time. If pt is sliding in chair when at bedside, there are concerns with pt receiving HD in chair and pt sliding in chair while receiving HD treatment. Will assist as needed.   Randine Mungo Dialysis Navigator [phone]

## 2024-07-08 NOTE — Progress Notes (Signed)
 [facility] night cross cover note:   I was notified by the patient's RN that this patient, who is on continuous tube feeds running at 45 cc/h, experienced a single episode of green appearing emesis. Patient received dose of iv zofran  and conveyed that her nausea is now resolved.   For now, will continue with tube feeds at current rate, but I asked to please be notified if the patient experiences any additional episodes of emesis this evening.    Eva Pore, DO Hospitalist

## 2024-07-08 NOTE — Progress Notes (Signed)
 Pt had one episode of green emesis mod amount. C/o nausea after an episode, and Zofran  IV given. About 10 min later pt denied nausea and feels better. She has continues tube feed going at 45 ml/hr. Howerter, MD notified.  Pt comfortable now, so will continue tube feed.  Per MD instructions, will notify MD of any new episode of emesis. Will continue to monitor pt.

## 2024-07-08 NOTE — Progress Notes (Signed)
 PROGRESS NOTE    [name] [name]  FMW:[id number] DOB: May 25, 1981 DOA: 05/19/2024 PCP: [facility]   Brief Narrative:  This 43-yrs-old female with PMH significant for HTN, Ischemic and hemorrhagic CVA, type 1 diabetes, gastroparesis, kidney and pancreas transplant 2014, subsequent failure of renal transplant, now ESRD on dialysis, anxiety, narcotic abuse. Presented to the [facility] ED from dialysis as code stroke on 9/15; subsequently found to have hemorrhagic stroke. Neurology, Neurosurgery, PCCM consulted. EVD was placed on the left side. Patient admitted to neuro ICU. She was intubated on admission. She had prolonged ICU stay with EVD drain which was changed to VP shunt on 10/15.  Extubated on 9/23.  Transferred to [facility] service on 10/17.     She was recently discharged from here in August after she was managed for ischemic/hemorrhagic stroke and was discharged to LTAC.   Important events:  9/15 admitted with ICH plus IVH, EVD was placed for hydrocephalus, intubated 9/22 palliative care meeting.  Family is insistent that they want full scope of care 9/23 Extubated 10/1 -developed fevers pancultures drawn and broad-spectrum antibiotics started 10/2 became afebrile after starting antibiotics, did not tolerate raising EVD 10/6 EVD was raised from 10 to 30 cm water , tolerating well, remained afebrile 10/7 EVD is at 30 cm water , CT scan will be done tomorrow morning, no overnight issues 10/8 she became somnolent, CT head showed increasing hydrocephalus, EVD was titrated down to 10 cm of water  with improvement in mental status 10/15 VP shunt placement 10/17 transferred to progressive unit with [name]     Assessment & Plan:   Principal Problem:   Hemorrhagic stroke [facility]) Active Problems:   Moderate protein-calorie malnutrition   Pressure injury of skin   Nontraumatic subcortical hemorrhage of right cerebral hemisphere [facility])   Acute right parietotemporal intraparenchymal  hemorrhage / Intraventricular hemorrhage Uncontrolled hypertension Obstructive hydrocephalus s/p EVD changed to VP shunt: Seizure disorder s/p VP shunt placement on 06/18/2024 by Dr. Mavis.  Remains stable on Vimpat  100 mg twice daily.  Neurosurgery following. Per them, no issue after the shunt.   ESRD on dialysis: History of failed renal transplant: Secondary hyperparathyroidism: Nephrology following. Patient on CellCept  and prednisone .  Dialyzed on MWF schedule. Has AV fistula on the right upper extremity. Needs to be able to dialyze in chair prior to discharge. Next HD tomorrow.  Acute bronchitis: Resolved.    Intermittent low-grade fever:  Thought to be secondary to intracranial hemorrhage. Previous blood cultures showed contamination.  S/p antibiotics for respiratory cultures positive for Serratia and Klebsiella.  Repeat cultures negative thus far. Patient afebrile for several days.   History of pancreatic transplantation:  Continue cyclosporine , mycophenolate , prednisone .   Hypertension:  Patient currently on Coreg , amlodipine  and clonidine  on nondialysis days.   BP low today, Hold BP medications.   Anemia of chronic disease:  No signs of active bleeding. Hemoglobin stable.   Type 1 diabetes with vasculopathy/gastroparesis:  Not on long-term insulin  anymore due to pancreatic transplant.  Monitor blood sugars. On sliding scale.   Moderate protein calorie malnutrition/dysphagia:  Has PEG tube. Continue tube feeds.  SLP and dietitian following.    Stage I sacral pressure ulcer present on admission: Continue wound care   Debility/deconditioning/disposition/goals of care:  Young patient with multiple comorbidities.  Palliative care consulted for goals of care conversations, family insist she remains full code.  Palliative has signed off 10/17.  Seen by PT OT.  They recommended CIR.  Reviewed by CIR, they recommend LTAC.  Peer to  peer with insurance company was done on  06/25/2024, and they denied LTAC.  They recommended LTC.  Appeal for LTAC denied again.  TOC working on placing her to LTC.   Diarrhea: Continue Imodium  as needed.  Flexi-Seal was inserted due to continuous loose stools.   DVT prophylaxis: SCDs Code Status: Full code Family Communication:Mother at bed side Disposition Plan:    Status is: Inpatient Remains inpatient appropriate because:    Needs to be able to dialyze in chair prior to discharge. Next HD in recliner tomorrow.   Consultants:  Nephrology  Procedures:  Antimicrobials:  Anti-infectives (From admission, onward)    Start     Dose/Rate Route Frequency Ordered Stop   06/18/24 2030  ceFAZolin  (ANCEF ) IVPB 2g/100 mL premix  Status:  Discontinued        2 g 200 mL/hr over 30 Minutes Intravenous Every 8 hours 06/18/24 1454 06/19/24 1608   06/18/24 0600  ceFAZolin  (ANCEF ) IVPB 2g/100 mL premix        2 g 200 mL/hr over 30 Minutes Intravenous On call to O.R. 06/15/24 1152 06/18/24 1230   06/06/24 1400  piperacillin -tazobactam (ZOSYN ) IVPB 2.25 g        2.25 g 100 mL/hr over 30 Minutes Intravenous Every 8 hours 06/06/24 1017 06/09/24 0538   06/06/24 1200  vancomycin  (VANCOREADY) IVPB 500 mg/100 mL        500 mg 100 mL/hr over 60 Minutes Intravenous Every M-W-F (Hemodialysis) 06/04/24 1745 06/06/24 1900   06/04/24 1845  vancomycin  (VANCOCIN ) IVPB 1000 mg/200 mL premix        1,000 mg 200 mL/hr over 60 Minutes Intravenous  Once 06/04/24 1745 06/04/24 1915   06/04/24 1830  ceFEPIme  (MAXIPIME ) 2 g in sodium chloride  0.9 % 100 mL IVPB  Status:  Discontinued        2 g 200 mL/hr over 30 Minutes Intravenous Every M-W-F (Hemodialysis) 06/04/24 1743 06/06/24 1017   05/27/24 1015  cefTRIAXone  (ROCEPHIN ) 2 g in sodium chloride  0.9 % 100 mL IVPB        2 g 200 mL/hr over 30 Minutes Intravenous Every 24 hours 05/27/24 0918 05/29/24 1014   05/23/24 1530  ceFEPIme  (MAXIPIME ) 1 g in sodium chloride  0.9 % 100 mL IVPB  Status:  Discontinued         1 g 200 mL/hr over 30 Minutes Intravenous Every 24 hours 05/23/24 1433 05/27/24 0918   05/22/24 2200  Ampicillin -Sulbactam (UNASYN ) 3 g in sodium chloride  0.9 % 100 mL IVPB  Status:  Discontinued        3 g 200 mL/hr over 30 Minutes Intravenous Every 24 hours 05/22/24 0834 05/23/24 1433   05/22/24 0930  Ampicillin -Sulbactam (UNASYN ) 3 g in sodium chloride  0.9 % 100 mL IVPB        3 g 200 mL/hr over 30 Minutes Intravenous  Once 05/22/24 0834 05/22/24 1900      Subjective: Patient was seen and examined at bedside. Overnight events noted. Patient reports feeling better, complains of pain in the buttock area. Needs to be able to dialyze in chair prior to discharge. HD in recliner tomorrow.  Objective: Vitals:   07/08/24 0343 07/08/24 0500 07/08/24 0729 07/08/24 1114  BP: 137/70  (!) 114/55 (!) 79/46  Pulse: 79  75 71  Resp: 18  17 16   Temp: 98.6 F (37 C)  98.1 F (36.7 C) 98.1 F (36.7 C)  TempSrc: Oral  Oral Axillary  SpO2: 100%  94% 92%  Weight:  52.4 kg    Height:       No intake or output data in the 24 hours ending 07/08/24 1250  Filed Weights   07/04/24 1214 07/07/24 0818 07/08/24 0500  Weight: 53.5 kg 52 kg 52.4 kg    Examination:  General exam: Appears calm and comfortable, not in any acute distress. Respiratory system: CTA Bilaterally. Respiratory effort normal. RR 14 Cardiovascular system: S1 & S2 heard, RRR. No JVD, murmurs, rubs, gallops or clicks.  Gastrointestinal system: Abdomen is nondistended, soft and nontender. Normal bowel sounds heard. Central nervous system: Alert and oriented x 3. No focal neurological deficits. Extremities: No edema, No cyanosis, No clubbing Skin: No rashes, lesions or ulcers Psychiatry: Judgement and insight appear normal. Mood & affect appropriate.     Data Reviewed: I have personally reviewed following labs and imaging studies  CBC: Recent Labs  Lab 07/02/24 0842 07/07/24 0220 07/08/24 0634  WBC 5.1 3.8* 4.9   HGB 7.6* 8.0* 8.2*  HCT 22.5* 24.2* 25.4*  MCV 93.4 94.9 96.6  PLT 144* 162 161   Basic Metabolic Panel: Recent Labs  Lab 07/02/24 0842 07/05/24 1152 07/07/24 0220 07/08/24 0634  NA 125* 129* 128* 133*  K 3.9 4.3 4.5 4.1  CL 84* 88* 84* 89*  CO2 26 25 24 30   GLUCOSE 134* 169* 118* 108*  BUN 65* 49* 98* 44*  CREATININE 4.56* 3.12* 5.24* 3.04*  CALCIUM  10.1 9.9 10.2 10.0  MG  --   --  3.3* 2.7*  PHOS 4.3  --  4.1 3.8   GFR: Estimated Creatinine Clearance: 19.9 mL/min (A) (by C-G formula based on SCr of 3.04 mg/dL (H)). Liver Function Tests: Recent Labs  Lab 07/02/24 0842  ALBUMIN  2.9*   No results for input(s): LIPASE, AMYLASE in the last 168 hours. No results for input(s): AMMONIA in the last 168 hours. Coagulation Profile: No results for input(s): INR, PROTIME in the last 168 hours. Cardiac Enzymes: No results for input(s): CKTOTAL, CKMB, CKMBINDEX, TROPONINI in the last 168 hours. BNP (last 3 results) No results for input(s): PROBNP in the last 8760 hours. HbA1C: No results for input(s): HGBA1C in the last 72 hours. CBG: Recent Labs  Lab 07/07/24 2028 07/08/24 0039 07/08/24 0448 07/08/24 0728 07/08/24 1108  GLUCAP 109* 125* 107* 105* 127*   Lipid Profile: No results for input(s): CHOL, HDL, LDLCALC, TRIG, CHOLHDL, LDLDIRECT in the last 72 hours. Thyroid Function Tests: No results for input(s): TSH, T4TOTAL, FREET4, T3FREE, THYROIDAB in the last 72 hours. Anemia Panel: No results for input(s): VITAMINB12, FOLATE, FERRITIN, TIBC, IRON , RETICCTPCT in the last 72 hours. Sepsis Labs: No results for input(s): PROCALCITON, LATICACIDVEN in the last 168 hours.  No results found for this or any previous visit (from the past 240 hours).  Radiology Studies: No results found. Scheduled Meds:  amLODipine   10 mg Oral Once per day on Sunday Tuesday Thursday Saturday   carvedilol   25 mg Per Tube 2 times per  day on Sunday Tuesday Thursday Saturday   Chlorhexidine  Gluconate Cloth  6 each Topical Q0600   cinacalcet   30 mg Oral Q M,W,F   cloNIDine   0.3 mg Per Tube Once per day on Sunday Tuesday Thursday Saturday   cycloSPORINE   200 mg Per Tube QPM   cycloSPORINE   225 mg Per Tube Daily   darbepoetin (ARANESP ) injection - DIALYSIS  150 mcg Subcutaneous Q Thu-1800   famotidine   10 mg Per Tube QHS   feeding supplement (NEPRO CARB STEADY)  1,000  mL Per Tube Q24H   fiber  1 packet Per Tube TID   gabapentin   100 mg Per Tube Daily   insulin  aspart  0-9 Units Subcutaneous Q4H   lacosamide   100 mg Per Tube BID   liver oil-zinc  oxide   Topical TID   multivitamin with minerals  1 tablet Per Tube Daily   mycophenolate   500 mg Per Tube BID   mouth rinse  15 mL Mouth Rinse 4 times per day   oxidized cellulose  1 each Topical Once   predniSONE   15 mg Per Tube Q breakfast   Continuous Infusions:   LOS: 50 days    Time spent: 35 mins    Darcel Dawley, MD [name]   If 7PM-7AM, please contact night-coverage

## 2024-07-08 NOTE — Progress Notes (Signed)
 Occupational Therapy Treatment Patient Details Name: [name] [name] MRN: [id number] DOB: Dec 02, 1980 Today's Date: 07/08/2024   History of present illness 43 yo female presents 05/19/24 for left gaze and L side weakness. 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 Intubated 9/15-9/23. EVD 9/15- 10/15; s/p shunt 10/15. S/p laparoscopy with intra-abdominal assistance ventricular-peritoneal shunt placement 10/15. PMH: HTN, ESRD on HD s/p renal and pancreatic transplants, DM, gastroparesis, multiple strokes (ischemic and hemorrhagic) with prepontine SAH, hydrocephalus requiring EVD placement   OT comments  Pt with slow, steady progression toward established OT goals with goals carried over this progress update and one goal added. Challenging communication, command following, strength, balance, sequencing this session with BADL and functional transfers. Per interdisciplinary team, goals for tolerating chair for longer spans of time to be a candidate to get dialysis from chair. Collaborated with PT for transfer to chair with mod A with pt demo fair initiation of LLE progression but needing total A for RLE progression with all weight through RLE throughout. Pt with bottom pain with all seated tasks. Repositioned in chair and comfortable on departure. Continue to recommend LTACH for continued OT services.       If plan is discharge home, recommend the following:  Two people to help with walking and/or transfers;Two people to help with bathing/dressing/bathroom;Assistance with cooking/housework;Assist for transportation;Help with stairs or ramp for entrance   Equipment Recommendations  Other (comment) (defer)    Recommendations for Other Services      Precautions / Restrictions Precautions Precautions: Fall Recall of Precautions/Restrictions: Impaired Precaution/Restrictions  Comments: PEG Restrictions Weight Bearing Restrictions Per Provider Order: No       Mobility Bed Mobility Overal bed mobility: Needs Assistance Bed Mobility: Supine to Sit     Supine to sit: Max assist, +2 for physical assistance     General bed mobility comments: Pt distracted by bottom pain and demonstrates little initiation despite cues. Ultimately max A +2 for all aspects    Transfers Overall transfer level: Needs assistance Equipment used: 2 person hand held assist Transfers: Sit to/from Stand, Bed to chair/wheelchair/BSC Sit to Stand: Mod assist, +2 physical assistance     Step pivot transfers: Mod assist, +2 physical assistance     General transfer comment: STS from EOB with mod A +2 due to limited initiation. Step pivot to recliner on R with assist for RLE advancement and balance. Pt able to advance LLE without assist     Balance Overall balance assessment: Needs assistance Sitting-balance support: Single extremity supported, Feet supported, Bilateral upper extremity supported Sitting balance-Leahy Scale: Poor Sitting balance - Comments: posterior lean requiring assist to maintain   Standing balance support: Bilateral upper extremity supported, During functional activity Standing balance-Leahy Scale: Poor Standing balance comment: reliant on therapist support to remain upright                           ADL either performed or assessed with clinical judgement   ADL Overall ADL's : Needs assistance/impaired     Grooming: Sitting;Moderate assistance                   Toilet Transfer: Moderate assistance;+2 for physical assistance;+2 for safety/equipment;Stand-pivot   Toileting- Clothing Manipulation and Hygiene: Total assistance;Bed level Toileting - Clothing Manipulation Details (indicate cue type and reason): mother provided pericare with max cues for pt to bridge hips  and bottom to be cleaned            Extremity/Trunk Assessment               Vision   Vision Assessment?: Vision impaired- to be further tested in functional context Additional Comments: greater eye contact today with therapists on L and R but poor visual attention to environment seems to have R preference. fixation limited to short periods   Perception     Praxis Praxis Praxis: Impaired Praxis Impairment Details: Motor planning;Initiation   Communication Communication Communication: Impaired Factors Affecting Communication: Difficulty expressing self;Reduced clarity of speech   Cognition Arousal: Alert Behavior During Therapy: Flat affect, Restless Cognition: Cognition impaired     Awareness: Intellectual awareness impaired Memory impairment (select all impairments): Short-term memory Attention impairment (select first level of impairment): Focused attention Executive functioning impairment (select all impairments): Initiation, Organization, Sequencing, Problem solving OT - Cognition Comments: perseverates on pain and itching, needing direct simple commands and ~30 seconds to follow at times.                 Following commands: Impaired Following commands impaired: Follows one step commands inconsistently, Follows one step commands with increased time      Cueing   Cueing Techniques: Verbal cues, Gestural cues, Tactile cues, Visual cues  Exercises      Shoulder Instructions       General Comments      Pertinent Vitals/ Pain       Pain Assessment Pain Assessment: Faces Faces Pain Scale: Hurts whole lot Pain Location: bottom Pain Descriptors / Indicators: Grimacing, Guarding, Crying Pain Intervention(s): Limited activity within patient's tolerance, Monitored during session  Home Living                                          Prior Functioning/Environment              Frequency  Min 2X/week        Progress Toward Goals  OT Goals(current goals can now be found in the care plan  section)  Progress towards OT goals: Progressing toward goals  Acute Rehab OT Goals OT Goal Formulation: Patient unable to participate in goal setting Time For Goal Achievement: 07/05/24 Potential to Achieve Goals: Good  Plan      Co-evaluation    PT/OT/SLP Co-Evaluation/Treatment: Yes (dovetail) Reason for Co-Treatment: For patient/therapist safety;To address functional/ADL transfers;Necessary to address cognition/behavior during functional activity;Complexity of the patient's impairments (multi-system involvement) PT goals addressed during session: Mobility/safety with mobility;Balance OT goals addressed during session: ADL's and self-care      AM-PAC OT 6 Clicks Daily Activity     Outcome Measure   Help from another person eating meals?: Total Help from another person taking care of personal grooming?: A Lot Help from another person toileting, which includes using toliet, bedpan, or urinal?: Total Help from another person bathing (including washing, rinsing, drying)?: A Lot Help from another person to put on and taking off regular upper body clothing?: A Lot Help from another person to put on and taking off regular lower body clothing?: Total 6 Click Score: 9    End of Session Equipment Utilized During Treatment: Gait belt  OT Visit Diagnosis: Unsteadiness on feet (R26.81);Muscle weakness (generalized) (M62.81);History of falling (Z91.81);Other symptoms and signs involving cognitive function;Cognitive communication deficit (R41.841);Low vision, both eyes (H54.2) Symptoms and signs involving cognitive  functions: Nontraumatic SAH   Activity Tolerance Patient tolerated treatment well   Patient Left in chair;with call bell/phone within reach;with chair alarm set;with family/visitor present   Nurse Communication Mobility status        Time: 1356-1431 OT Time Calculation (min): 35 min  Charges: OT General Charges $OT Visit: 1 Visit OT Treatments $Therapeutic  Activity: 8-22 mins  Elma JONETTA Penner, OTD, OTR/L [facility] Acute Rehabilitation Office: [phone]   Elma JONETTA Penner 07/08/2024, 3:23 PM

## 2024-07-08 NOTE — Progress Notes (Addendum)
 [facility] Progress Note   Subjective:   Seen in room - ongoing buttocks pain, using antifungal powder to area. Mom in room is asking about oral antifungal - directed to ask her hospitalist. No CP/dyspnea.  Objective Vitals:   07/07/24 2131 07/08/24 0343 07/08/24 0500 07/08/24 0729  BP: 131/64 137/70  (!) 114/55  Pulse: 76 79  75  Resp: 18 18  17   Temp: 98.2 F (36.8 C) 98.6 F (37 C)  98.1 F (36.7 C)  TempSrc: Oral Oral  Oral  SpO2: 99% 100%  94%  Weight:   52.4 kg   Height:       Physical Exam General: Chronically ill appearing woman, NAD. Room air Heart: RRR, no murmur Lungs: CTA anteriorly Abdomen: soft Extremities: no LE edema Dialysis Access: R AVF +t/b  Additional Objective Labs: Basic Metabolic Panel: Recent Labs  Lab 07/02/24 0842 07/05/24 1152 07/07/24 0220 07/08/24 0634  NA 125* 129* 128* 133*  K 3.9 4.3 4.5 4.1  CL 84* 88* 84* 89*  CO2 26 25 24 30   GLUCOSE 134* 169* 118* 108*  BUN 65* 49* 98* 44*  CREATININE 4.56* 3.12* 5.24* 3.04*  CALCIUM  10.1 9.9 10.2 10.0  PHOS 4.3  --  4.1 3.8   Liver Function Tests: Recent Labs  Lab 07/02/24 0842  ALBUMIN  2.9*   CBC: Recent Labs  Lab 07/02/24 0842 07/07/24 0220 07/08/24 0634  WBC 5.1 3.8* 4.9  HGB 7.6* 8.0* 8.2*  HCT 22.5* 24.2* 25.4*  MCV 93.4 94.9 96.6  PLT 144* 162 161   Medications:   amLODipine   10 mg Oral Once per day on Sunday Tuesday Thursday Saturday   carvedilol   25 mg Per Tube 2 times per day on Sunday Tuesday Thursday Saturday   Chlorhexidine  Gluconate Cloth  6 each Topical Q0600   cinacalcet   30 mg Oral Q M,W,F   cloNIDine   0.3 mg Per Tube Once per day on Sunday Tuesday Thursday Saturday   cycloSPORINE   200 mg Per Tube QPM   cycloSPORINE   225 mg Per Tube Daily   darbepoetin (ARANESP ) injection - DIALYSIS  150 mcg Subcutaneous Q Thu-1800   famotidine   10 mg Per Tube QHS   feeding supplement (NEPRO CARB STEADY)  1,000 mL Per Tube Q24H   fiber  1 packet Per  Tube TID   gabapentin   100 mg Per Tube Daily   insulin  aspart  0-9 Units Subcutaneous Q4H   lacosamide   100 mg Per Tube BID   liver oil-zinc  oxide   Topical TID   multivitamin with minerals  1 tablet Per Tube Daily   mycophenolate   500 mg Per Tube BID   mouth rinse  15 mL Mouth Rinse 4 times per day   oxidized cellulose  1 each Topical Once   predniSONE   15 mg Per Tube Q breakfast    Dialysis Orders MWF - NW 3:45hr, 400/A1.5, EDW 55.2kg, 2K/2Ca bath, AVF, no heparin  - Prev on Mircera 150mcg IV q 2 weeks - No VDRA   Assessment/Plan: Acute R intracranial hemorrhage: 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 Needs ongoing rehab. ESRD: Continue HD on MWF schedule - next HD tomorrow, will try to dialyze in recliner. HTN/volume: BP variable, up at times, no LE edema, UF as tolerated. Continue home meds. Anemia of ESRD: Hgb 8.2, improving - continue Aranesp  150mcg q Thursday. Secondary HPTH: CorrCa high, Phos ok. Continue sensipar , not on VDRA. Nutrition: Alb low, continue Nepro TF. Hx pancreas/kidney transplant: Remains on IS for  pancreas function. Hx T1D/gastroparesis Buttocks wound/?yeast: Using antifungal powder, issues sitting in chair. Dispo: Insurance declined LTAC, will be going to SNF. Needs to be able to dialyze in chair prior to discharge - pending.   Izetta Boehringer, PA-C 07/08/2024, 9:29 AM  [facility]   Seen and examined independently.  Agree with note and exam as documented above by physician extender and as noted here.  Spoke with mother at bedside this AM with pt. Nausea per charting.    General adult female in bed in no acute distress chronically ill appearing HEENT right cranial bandage; extraocular movements intact sclera anicteric Neck supple trachea midline Lungs clear to auscultation bilaterally normal work of breathing at rest on room air Heart S1S2 no rub Abdomen soft nontender nondistended Extremities no edema  Psych blunted affect; no  anxiety or agitation Neuro -gives correct but short, limited responses.   Access LUE AVF with bruit and thrill   ESRD on HD - MWF - first try sitting in a chair in her room and then if this is ok can try chair for HD.   HTN - acceptable; isolated hypotension and unsure if accurate   Anemia of ESRD - on aranesp   Per HD SW she was denied LTAC and so they are looking for a SNF  Katheryn JAYSON Saba, MD 07/08/2024  1:39 PM

## 2024-07-08 NOTE — TOC Progression Note (Signed)
 Transition of Care [facility]) - Progression Note    Patient Details  Name: [name] [name] MRN: [id number] Date of Birth: 04-Apr-1981  Transition of Care [facility]) CM/SW Contact  [name], RN Phone Number: 07/08/2024, 4:05 PM  Clinical Narrative:     IP Care management working on SNF. Pt will need to be able to sit for outpatient HD.   Expected Discharge Plan: Long Term Acute Care (LTAC) Barriers to Discharge: Continued Medical Work up, Air Traffic Controller and Services In-house Referral: Clinical Social Work   Post Acute Care Choice: Skilled Nursing Facility Living arrangements for the past 2 months: Apartment                                       Social Drivers of Health (SDOH) Interventions SDOH Screenings   Food Insecurity: Patient Unable To Answer (05/20/2024)  Housing: Patient Unable To Answer (05/20/2024)  Transportation Needs: Patient Unable To Answer (05/20/2024)  Utilities: Patient Unable To Answer (05/20/2024)  Social Connections: Unknown (10/04/2022)   Received from [facility]  Stress: No Stress Concern Present (04/16/2024)   Received from [facility]  Tobacco Use: Medium Risk (06/18/2024)    Readmission Risk Interventions    05/20/2024    4:16 PM 03/31/2024   11:27 AM 11/17/2021   12:15 PM  Readmission Risk Prevention Plan  Transportation Screening Complete Complete Complete  Medication Review Oceanographer) Complete Complete Complete  PCP or Specialist appointment within 3-5 days of discharge Complete  Complete  HRI or Home Care Consult Complete Complete Complete  SW Recovery Care/Counseling Consult Complete Complete Complete  Palliative Care Screening Complete Not Applicable Not Applicable  Skilled Nursing Facility Complete Not Applicable Patient Refused

## 2024-07-08 NOTE — Progress Notes (Signed)
 Physical Therapy Treatment Patient Details Name: [name] [name] MRN: [id number] DOB: [birth year] Today's Date: 07/08/2024   History of Present Illness 43 yo female presents 05/19/24 for left gaze and L side weakness. 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 Intubated 9/15-9/23. EVD 9/15- 10/15; s/p shunt 10/15. S/p laparoscopy with intra-abdominal assistance ventricular-peritoneal shunt placement 10/15. PMH: HTN, ESRD on HD s/p renal and pancreatic transplants, DM, gastroparesis, multiple strokes (ischemic and hemorrhagic) with prepontine SAH, hydrocephalus requiring EVD placement    PT Comments  Pt received in supine and reports increased bottom pain limiting activity tolerance. Pt requires increased time and cues to follow commands. Pt able to stand and step to recliner with mod A +2 with assist for advancing RLE. Pt sits quickly to recliner demonstrating short standing tolerance. Pt requires assist to reposition in chair to prevent sliding forward with pt reporting relief once pillow placed behind back. Pt continues to benefit from PT services to progress toward functional mobility goals.     If plan is discharge home, recommend the following: A lot of help with walking and/or transfers;A lot of help with bathing/dressing/bathroom;Assistance with cooking/housework;Direct supervision/assist for medications management;Direct supervision/assist for financial management;Assist for transportation;Help with stairs or ramp for entrance   Can travel by private vehicle        Equipment Recommendations  Hospital bed;Hoyer lift;Wheelchair (measurements PT);Wheelchair cushion (measurements PT);BSC/3in1    Recommendations for Other Services       Precautions / Restrictions Precautions Precautions: Fall Recall of Precautions/Restrictions: Impaired Precaution/Restrictions  Comments: PEG Restrictions Weight Bearing Restrictions Per Provider Order: No     Mobility  Bed Mobility Overal bed mobility: Needs Assistance Bed Mobility: Supine to Sit     Supine to sit: Max assist, +2 for physical assistance     General bed mobility comments: Pt distracted by bottom pain and demonstrates little initiation despite cues. Ultimately max A +2 for all aspects    Transfers Overall transfer level: Needs assistance Equipment used: 2 person hand held assist Transfers: Sit to/from Stand, Bed to chair/wheelchair/BSC Sit to Stand: Mod assist, +2 physical assistance   Step pivot transfers: Mod assist, +2 physical assistance       General transfer comment: STS from EOB with mod A +2 due to limited initiation. Step pivot to recliner with assist for RLE advancement and balance. Pt able to advance LLE without assist    Ambulation/Gait               General Gait Details: unable   Stairs             Wheelchair Mobility     Tilt Bed    Modified Rankin (Stroke Patients Only) Modified Rankin (Stroke Patients Only) Pre-Morbid Rankin Score: Moderately severe disability Modified Rankin: Severe disability     Balance Overall balance assessment: Needs assistance Sitting-balance support: Single extremity supported, Feet supported, Bilateral upper extremity supported Sitting balance-Leahy Scale: Poor Sitting balance - Comments: posterior lean requiring assist to maintain   Standing balance support: Bilateral upper extremity supported, During functional activity Standing balance-Leahy Scale: Poor Standing balance comment: reliant on therapist support to remain upright                            Communication Communication Communication: Impaired Factors Affecting Communication: Difficulty expressing self;Reduced clarity of speech  Cognition Arousal: Alert Behavior During Therapy:  Flat affect, Restless   PT - Cognitive impairments:  Awareness, Memory, Attention, Initiation, Sequencing, Safety/Judgement, Problem solving, Orientation                         Following commands: Impaired Following commands impaired: Follows one step commands inconsistently, Follows one step commands with increased time    Cueing Cueing Techniques: Verbal cues, Gestural cues, Tactile cues, Visual cues  Exercises      General Comments        Pertinent Vitals/Pain Pain Assessment Pain Assessment: Faces Faces Pain Scale: Hurts whole lot Pain Location: bottom Pain Descriptors / Indicators: Grimacing, Guarding, Crying Pain Intervention(s): Monitored during session, Repositioned, Limited activity within patient's tolerance     PT Goals (current goals can now be found in the care plan section) Acute Rehab PT Goals Patient Stated Goal: unable to state goal PT Goal Formulation: Patient unable to participate in goal setting Time For Goal Achievement: 07/05/24 Progress towards PT goals: Progressing toward goals    Frequency    Min 2X/week      PT Plan      Co-evaluation PT/OT/SLP Co-Evaluation/Treatment: Yes Reason for Co-Treatment: For patient/therapist safety;To address functional/ADL transfers;Necessary to address cognition/behavior during functional activity;Complexity of the patient's impairments (multi-system involvement) PT goals addressed during session: Mobility/safety with mobility;Balance        AM-PAC PT 6 Clicks Mobility   Outcome Measure  Help needed turning from your back to your side while in a flat bed without using bedrails?: A Lot Help needed moving from lying on your back to sitting on the side of a flat bed without using bedrails?: A Lot Help needed moving to and from a bed to a chair (including a wheelchair)?: Total Help needed standing up from a chair using your arms (e.g., wheelchair or bedside chair)?: Total Help needed to walk in hospital room?: Total Help needed climbing 3-5 steps  with a railing? : Total 6 Click Score: 8    End of Session Equipment Utilized During Treatment: Gait belt Activity Tolerance: Patient limited by pain Patient left: with call bell/phone within reach;with family/visitor present;in chair;with chair alarm set Nurse Communication: Mobility status PT Visit Diagnosis: Unsteadiness on feet (R26.81);Other abnormalities of gait and mobility (R26.89);Muscle weakness (generalized) (M62.81);Difficulty in walking, not elsewhere classified (R26.2);Other symptoms and signs involving the nervous system (R29.898)     Time: 1402-1430 PT Time Calculation (min) (ACUTE ONLY): 28 min  Charges:    $Therapeutic Activity: 8-22 mins PT General Charges $$ ACUTE PT VISIT: 1 Visit                     Darryle George, PTA Acute Rehabilitation Services Secure Chat Preferred  [phone]    Darryle George 07/08/2024, 2:52 PM

## 2024-07-09 DIAGNOSIS — I619 Nontraumatic intracerebral hemorrhage, unspecified: Secondary | ICD-10-CM | POA: Diagnosis not present

## 2024-07-09 LAB — CBC
HCT: 23.6 % — ABNORMAL LOW (ref 36.0–46.0)
Hemoglobin: 7.5 g/dL — ABNORMAL LOW (ref 12.0–15.0)
MCH: 30.7 pg (ref 26.0–34.0)
MCHC: 31.8 g/dL (ref 30.0–36.0)
MCV: 96.7 fL (ref 80.0–100.0)
Platelets: 168 K/uL (ref 150–400)
RBC: 2.44 MIL/uL — ABNORMAL LOW (ref 3.87–5.11)
RDW: 14.6 % (ref 11.5–15.5)
WBC: 3.8 K/uL — ABNORMAL LOW (ref 4.0–10.5)
nRBC: 0 % (ref 0.0–0.2)

## 2024-07-09 LAB — GLUCOSE, CAPILLARY
Glucose-Capillary: 117 mg/dL — ABNORMAL HIGH (ref 70–99)
Glucose-Capillary: 122 mg/dL — ABNORMAL HIGH (ref 70–99)
Glucose-Capillary: 86 mg/dL (ref 70–99)
Glucose-Capillary: 86 mg/dL (ref 70–99)
Glucose-Capillary: 93 mg/dL (ref 70–99)

## 2024-07-09 LAB — BASIC METABOLIC PANEL WITH GFR
Anion gap: 18 — ABNORMAL HIGH (ref 5–15)
BUN: 60 mg/dL — ABNORMAL HIGH (ref 6–20)
CO2: 27 mmol/L (ref 22–32)
Calcium: 10.2 mg/dL (ref 8.9–10.3)
Chloride: 84 mmol/L — ABNORMAL LOW (ref 98–111)
Creatinine, Ser: 4.02 mg/dL — ABNORMAL HIGH (ref 0.44–1.00)
GFR, Estimated: 14 mL/min — ABNORMAL LOW (ref >60)
Glucose, Bld: 104 mg/dL — ABNORMAL HIGH (ref 70–99)
Potassium: 4.5 mmol/L (ref 3.5–5.1)
Sodium: 129 mmol/L — ABNORMAL LOW (ref 135–145)

## 2024-07-09 LAB — MAGNESIUM: Magnesium: 3.2 mg/dL — ABNORMAL HIGH (ref 1.7–2.4)

## 2024-07-09 LAB — PHOSPHORUS: Phosphorus: 4.6 mg/dL (ref 2.5–4.6)

## 2024-07-09 MED ORDER — OXYCODONE HCL 5 MG PO TABS
ORAL_TABLET | ORAL | Status: AC
  Filled 2024-07-09: qty 1

## 2024-07-09 NOTE — Plan of Care (Signed)
  Problem: Coping: Goal: Ability to adjust to condition or change in health will improve Outcome: Progressing   Problem: Fluid Volume: Goal: Ability to maintain a balanced intake and output will improve Outcome: Progressing   Problem: Metabolic: Goal: Ability to maintain appropriate glucose levels will improve Outcome: Progressing   Problem: Tissue Perfusion: Goal: Adequacy of tissue perfusion will improve Outcome: Progressing   

## 2024-07-09 NOTE — Progress Notes (Signed)
 PROGRESS NOTE    [name] [name] [name] [name]  FMW:[id number] DOB: [birth year] DOA: 05/19/2024 PCP: [name], [name] [name]   Brief Narrative:  This 43-yrs-old female with PMH significant for HTN, Ischemic and hemorrhagic CVA, type 1 diabetes, gastroparesis, kidney and pancreas transplant 2014, subsequent failure of renal transplant, now ESRD on dialysis, anxiety, narcotic abuse. Presented to the [facility] ED from dialysis as code stroke on 9/15; subsequently found to have hemorrhagic stroke. Neurology, Neurosurgery, PCCM consulted. EVD was placed on the left side. Patient admitted to neuro ICU. She was intubated on admission. She had prolonged ICU stay with EVD drain which was changed to VP shunt on 10/15.  Extubated on 9/23.  Transferred to [name] [name] service on 10/17.     She was recently discharged from here in August after she was managed for ischemic/hemorrhagic stroke and was discharged to LTAC.   Important events:  9/15 admitted with ICH plus IVH, EVD was placed for hydrocephalus, intubated 9/22 palliative care meeting.  Family is insistent that they want full scope of care 9/23 Extubated 10/1 -developed fevers pancultures drawn and broad-spectrum antibiotics started 10/2 became afebrile after starting antibiotics, did not tolerate raising EVD 10/6 EVD was raised from 10 to 30 cm water , tolerating well, remained afebrile 10/7 EVD is at 30 cm water , CT scan will be done tomorrow morning, no overnight issues 10/8 she became somnolent, CT head showed increasing hydrocephalus, EVD was titrated down to 10 cm of water  with improvement in mental status 10/15 VP shunt placement 10/17 transferred to progressive unit with [name]     Assessment & Plan:   Principal Problem:   Hemorrhagic stroke [name] [name]) Active Problems:   Moderate protein-calorie malnutrition   Pressure injury of skin   Nontraumatic subcortical hemorrhage of right cerebral hemisphere [name] [name])   Acute right parietotemporal intraparenchymal  hemorrhage / Intraventricular hemorrhage Uncontrolled hypertension Obstructive hydrocephalus s/p EVD changed to VP shunt: Seizure disorder s/p VP shunt placement on 06/18/2024 by Dr. Mavis.  Remains stable on Vimpat  100 mg twice daily.  Neurosurgery following. Per them, no issue after the shunt.   ESRD on dialysis: History of failed renal transplant: Secondary hyperparathyroidism: Nephrology following. Patient on CellCept  and prednisone .  Dialyzed on MWF schedule. Has AV fistula on the right upper extremity. Needs to be able to dialyze in chair prior to discharge. Next HD today.  Acute bronchitis: Resolved.    Intermittent low-grade fever:  Thought to be secondary to intracranial hemorrhage. Previous blood cultures showed contamination.  S/p antibiotics for respiratory cultures positive for Serratia and Klebsiella.  Repeat cultures negative thus far. Patient afebrile for several days.   History of pancreatic transplantation:  Continue cyclosporine , mycophenolate , prednisone .   Hypertension:  Patient currently on Coreg , amlodipine  and clonidine  on nondialysis days.   BP low today, Hold BP medications.   Anemia of chronic disease:  No signs of active bleeding. Hemoglobin stable.   Type 1 diabetes with vasculopathy/gastroparesis:  Not on long-term insulin  anymore due to pancreatic transplant.  Monitor blood sugars. On sliding scale.   Moderate protein calorie malnutrition/dysphagia:  Has PEG tube. Continue tube feeds.  SLP and dietitian following.    Stage I sacral pressure ulcer present on admission: Continue wound care   Debility/deconditioning/disposition/goals of care:  Young patient with multiple comorbidities.  Palliative care consulted for goals of care conversations, family insist she remains full code.  Palliative has signed off 10/17.  Seen by PT OT.  They recommended CIR.  Reviewed by CIR, they recommend LTAC.  Peer to  peer with insurance company was done on  06/25/2024, and they denied LTAC.  They recommended LTC.  Appeal for LTAC denied again.  TOC working on placing her to LTC.   Diarrhea: Continue Imodium  as needed.  Flexi-Seal was inserted due to continuous loose stools.   DVT prophylaxis: SCDs Code Status: Full code Family Communication:Mother at bed side Disposition Plan:    Status is: Inpatient Remains inpatient appropriate because:    Needs to be able to dialyze in chair prior to discharge. Next HD in recliner today.   Consultants:  Nephrology  Procedures:  Antimicrobials:  Anti-infectives (From admission, onward)    Start     Dose/Rate Route Frequency Ordered Stop   07/08/24 1530  fluconazole  (DIFLUCAN ) tablet 150 mg        150 mg Oral  Once 07/08/24 1437 07/08/24 1544   06/18/24 2030  ceFAZolin  (ANCEF ) IVPB 2g/100 mL premix  Status:  Discontinued        2 g 200 mL/hr over 30 Minutes Intravenous Every 8 hours 06/18/24 1454 06/19/24 1608   06/18/24 0600  ceFAZolin  (ANCEF ) IVPB 2g/100 mL premix        2 g 200 mL/hr over 30 Minutes Intravenous On call to O.R. 06/15/24 1152 06/18/24 1230   06/06/24 1400  piperacillin -tazobactam (ZOSYN ) IVPB 2.25 g        2.25 g 100 mL/hr over 30 Minutes Intravenous Every 8 hours 06/06/24 1017 06/09/24 0538   06/06/24 1200  vancomycin  (VANCOREADY) IVPB 500 mg/100 mL        500 mg 100 mL/hr over 60 Minutes Intravenous Every M-W-F (Hemodialysis) 06/04/24 1745 06/06/24 1900   06/04/24 1845  vancomycin  (VANCOCIN ) IVPB 1000 mg/200 mL premix        1,000 mg 200 mL/hr over 60 Minutes Intravenous  Once 06/04/24 1745 06/04/24 1915   06/04/24 1830  ceFEPIme  (MAXIPIME ) 2 g in sodium chloride  0.9 % 100 mL IVPB  Status:  Discontinued        2 g 200 mL/hr over 30 Minutes Intravenous Every M-W-F (Hemodialysis) 06/04/24 1743 06/06/24 1017   05/27/24 1015  cefTRIAXone  (ROCEPHIN ) 2 g in sodium chloride  0.9 % 100 mL IVPB        2 g 200 mL/hr over 30 Minutes Intravenous Every 24 hours 05/27/24 0918 05/29/24  1014   05/23/24 1530  ceFEPIme  (MAXIPIME ) 1 g in sodium chloride  0.9 % 100 mL IVPB  Status:  Discontinued        1 g 200 mL/hr over 30 Minutes Intravenous Every 24 hours 05/23/24 1433 05/27/24 0918   05/22/24 2200  Ampicillin -Sulbactam (UNASYN ) 3 g in sodium chloride  0.9 % 100 mL IVPB  Status:  Discontinued        3 g 200 mL/hr over 30 Minutes Intravenous Every 24 hours 05/22/24 0834 05/23/24 1433   05/22/24 0930  Ampicillin -Sulbactam (UNASYN ) 3 g in sodium chloride  0.9 % 100 mL IVPB        3 g 200 mL/hr over 30 Minutes Intravenous  Once 05/22/24 0834 05/22/24 1900      Subjective: Patient was seen and examined at bedside. Overnight events noted. Patient reports feeling better, pain in the buttock area has improved. She sat in the recliner for 4 hours yesterday as per mother. Needs to be able to dialyze in chair prior to discharge. HD in recliner today.  Objective: Vitals:   07/09/24 0358 07/09/24 0738 07/09/24 1010 07/09/24 1024  BP: (!) 155/68 (!) 148/62 136/62 136/62  Pulse: 76 77  79 81  Resp: 18 (!) 22 14 17   Temp: 97.8 F (36.6 C) 97.8 F (36.6 C) 97.6 F (36.4 C)   TempSrc: Oral Oral    SpO2: 99% 100% 100% 100%  Weight:      Height:        Intake/Output Summary (Last 24 hours) at 07/09/2024 1130 Last data filed at 07/09/2024 0738 Gross per 24 hour  Intake 6053.92 ml  Output 0 ml  Net 6053.92 ml    Filed Weights   07/04/24 1214 07/07/24 0818 07/08/24 0500  Weight: 53.5 kg 52 kg 52.4 kg    Examination:  General exam: Appears calm and comfortable, not in any acute distress. Respiratory system: CTA Bilaterally. Respiratory effort normal. RR 16 Cardiovascular system: S1 & S2 heard, RRR. No JVD, murmurs, rubs, gallops or clicks.  Gastrointestinal system: Abdomen is nondistended, soft and nontender. Normal bowel sounds heard. Central nervous system: Alert and oriented x 3. No focal neurological deficits. Extremities: No edema, No cyanosis, No clubbing Skin: No  rashes, lesions or ulcers Psychiatry: Judgement and insight appear normal. Mood & affect appropriate.     Data Reviewed: I have personally reviewed following labs and imaging studies  CBC: Recent Labs  Lab 07/07/24 0220 07/08/24 0634 07/09/24 0125  WBC 3.8* 4.9 3.8*  HGB 8.0* 8.2* 7.5*  HCT 24.2* 25.4* 23.6*  MCV 94.9 96.6 96.7  PLT 162 161 168   Basic Metabolic Panel: Recent Labs  Lab 07/05/24 1152 07/07/24 0220 07/08/24 0634 07/09/24 0125  NA 129* 128* 133* 129*  K 4.3 4.5 4.1 4.5  CL 88* 84* 89* 84*  CO2 25 24 30 27   GLUCOSE 169* 118* 108* 104*  BUN 49* 98* 44* 60*  CREATININE 3.12* 5.24* 3.04* 4.02*  CALCIUM  9.9 10.2 10.0 10.2  MG  --  3.3* 2.7* 3.2*  PHOS  --  4.1 3.8 4.6   GFR: Estimated Creatinine Clearance: 15.1 mL/min (A) (by C-G formula based on SCr of 4.02 mg/dL (H)). Liver Function Tests: No results for input(s): AST, ALT, ALKPHOS, BILITOT, PROT, ALBUMIN  in the last 168 hours.  No results for input(s): LIPASE, AMYLASE in the last 168 hours. No results for input(s): AMMONIA in the last 168 hours. Coagulation Profile: No results for input(s): INR, PROTIME in the last 168 hours. Cardiac Enzymes: No results for input(s): CKTOTAL, CKMB, CKMBINDEX, TROPONINI in the last 168 hours. BNP (last 3 results) No results for input(s): PROBNP in the last 8760 hours. HbA1C: No results for input(s): HGBA1C in the last 72 hours. CBG: Recent Labs  Lab 07/08/24 1544 07/08/24 1946 07/08/24 2305 07/09/24 0358 07/09/24 0739  GLUCAP 158* 172* 131* 117* 93   Lipid Profile: No results for input(s): CHOL, HDL, LDLCALC, TRIG, CHOLHDL, LDLDIRECT in the last 72 hours. Thyroid Function Tests: No results for input(s): TSH, T4TOTAL, FREET4, T3FREE, THYROIDAB in the last 72 hours. Anemia Panel: No results for input(s): VITAMINB12, FOLATE, FERRITIN, TIBC, IRON , RETICCTPCT in the last 72 hours. Sepsis  Labs: No results for input(s): PROCALCITON, LATICACIDVEN in the last 168 hours.  No results found for this or any previous visit (from the past 240 hours).  Radiology Studies: No results found. Scheduled Meds:  amLODipine   10 mg Oral Once per day on Sunday Tuesday Thursday Saturday   carvedilol   25 mg Per Tube 2 times per day on Sunday Tuesday Thursday Saturday   Chlorhexidine  Gluconate Cloth  6 each Topical Q0600   cinacalcet   30 mg Oral Q M,W,F   cloNIDine   0.3 mg Per Tube Once per day on Sunday Tuesday Thursday Saturday   cycloSPORINE   200 mg Per Tube QPM   cycloSPORINE   225 mg Per Tube Daily   darbepoetin (ARANESP ) injection - DIALYSIS  150 mcg Subcutaneous Q Thu-1800   famotidine   10 mg Per Tube QHS   feeding supplement (NEPRO CARB STEADY)  1,000 mL Per Tube Q24H   fiber  1 packet Per Tube TID   gabapentin   100 mg Per Tube Daily   lacosamide   100 mg Per Tube BID   liver oil-zinc  oxide   Topical TID   multivitamin with minerals  1 tablet Per Tube Daily   mycophenolate   500 mg Per Tube BID   mouth rinse  15 mL Mouth Rinse 4 times per day   oxidized cellulose  1 each Topical Once   predniSONE   15 mg Per Tube Q breakfast   Continuous Infusions:   LOS: 51 days    Time spent: 35 mins    Darcel Dawley, MD [name]   If 7PM-7AM, please contact night-coverage

## 2024-07-09 NOTE — Progress Notes (Addendum)
 [facility] Progress Note   Subjective:   Seen in room, mother at bedside. She reprots pain in her bottom is improving. She was able to sit in a recliner in her room yesterday. Plan is for HD today in recliner. No CP/dyspnea. Occ nausea.  Objective Vitals:   07/08/24 2237 07/08/24 2305 07/09/24 0358 07/09/24 0738  BP: 133/65 136/67 (!) 155/68 (!) 148/62  Pulse: 75 75 76 77  Resp:  18 18 (!) 22  Temp:  98.5 F (36.9 C) 97.8 F (36.6 C) 97.8 F (36.6 C)  TempSrc:  Oral Oral Oral  SpO2:  100% 99% 100%  Weight:      Height:       Physical Exam General: Chronically ill appearing woman, NAD. Room air Heart: RRR, no murmur Lungs: CTA anteriorly Abdomen: soft, PEG in place Extremities: no LE edema Dialysis Access: R AVF +t/b  Additional Objective Labs: Basic Metabolic Panel: Recent Labs  Lab 07/07/24 0220 07/08/24 0634 07/09/24 0125  NA 128* 133* 129*  K 4.5 4.1 4.5  CL 84* 89* 84*  CO2 24 30 27   GLUCOSE 118* 108* 104*  BUN 98* 44* 60*  CREATININE 5.24* 3.04* 4.02*  CALCIUM  10.2 10.0 10.2  PHOS 4.1 3.8 4.6   CBC: Recent Labs  Lab 07/07/24 0220 07/08/24 0634 07/09/24 0125  WBC 3.8* 4.9 3.8*  HGB 8.0* 8.2* 7.5*  HCT 24.2* 25.4* 23.6*  MCV 94.9 96.6 96.7  PLT 162 161 168   Medications:   amLODipine   10 mg Oral Once per day on Sunday Tuesday Thursday Saturday   carvedilol   25 mg Per Tube 2 times per day on Sunday Tuesday Thursday Saturday   Chlorhexidine  Gluconate Cloth  6 each Topical Q0600   cinacalcet   30 mg Oral Q M,W,F   cloNIDine   0.3 mg Per Tube Once per day on Sunday Tuesday Thursday Saturday   cycloSPORINE   200 mg Per Tube QPM   cycloSPORINE   225 mg Per Tube Daily   darbepoetin (ARANESP ) injection - DIALYSIS  150 mcg Subcutaneous Q Thu-1800   famotidine   10 mg Per Tube QHS   feeding supplement (NEPRO CARB STEADY)  1,000 mL Per Tube Q24H   fiber  1 packet Per Tube TID   gabapentin   100 mg Per Tube Daily   insulin  aspart  0-9 Units  Subcutaneous Q4H   lacosamide   100 mg Per Tube BID   liver oil-zinc  oxide   Topical TID   multivitamin with minerals  1 tablet Per Tube Daily   mycophenolate   500 mg Per Tube BID   mouth rinse  15 mL Mouth Rinse 4 times per day   oxidized cellulose  1 each Topical Once   predniSONE   15 mg Per Tube Q breakfast    Dialysis Orders MWF - NW 3:45hr, 400/A1.5, EDW 55.2kg, 2K/2Ca bath, AVF, no heparin  - Prev on Mircera 150mcg IV q 2 weeks - No VDRA   Assessment/Plan: Acute R intracranial hemorrhage: 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 Needs ongoing rehab. ESRD: Continue HD on MWF schedule - for HD today, in recliner. HTN/volume: BP variable, up at times, no LE edema, UF as tolerated. Continue home meds. Anemia of ESRD: Hgb 7.5 - down again - continue Aranesp  150mcg q Thursday. Secondary HPTH: CorrCa high, Phos ok. Continue sensipar , not on VDRA. Nutrition: Alb low, continue Nepro TF. Consider another swallowing study? she is much more alert than prior. 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 Hx T1D/gastroparesis Buttocks  wound/?yeast: Using antifungal powder, improving. Dispo: Insurance declined LTAC, will be going to SNF. Needs to be able to dialyze in chair prior to discharge - trying today.     Izetta Boehringer, PA-C 07/09/2024, 9:33 AM  [facility]   Seen and examined independently.  Agree with note and exam as documented above by physician extender and as noted here.  Nausea with emesis overnight.  Also with abdominal pain.  Saw patient independently this AM.  Asked that her tube feeds be paused and that the RN reach out to her team about the most recent episode.    Seen and examined on dialysis.  Blood pressure 94/50 and HR 75.  Reduced UF goal again to keep even for today.  She is on an aggressive blood pressure regimen and may need to titrate down   Katheryn JAYSON Saba, MD 07/09/2024  11:29 AM

## 2024-07-09 NOTE — Plan of Care (Signed)
  Problem: Coping: Goal: Ability to adjust to condition or change in health will improve Outcome: Progressing   Problem: Fluid Volume: Goal: Ability to maintain a balanced intake and output will improve Outcome: Progressing   Problem: Metabolic: Goal: Ability to maintain appropriate glucose levels will improve Outcome: Progressing   Problem: Tissue Perfusion: Goal: Adequacy of tissue perfusion will improve Outcome: Progressing   Problem: Education: Goal: Knowledge of disease or condition will improve Outcome: Progressing Goal: Knowledge of secondary prevention will improve (MUST DOCUMENT ALL) Outcome: Progressing Goal: Knowledge of patient specific risk factors will improve (DELETE if not current risk factor) Outcome: Progressing   Problem: Intracerebral Hemorrhage Tissue Perfusion: Goal: Complications of Intracerebral Hemorrhage will be minimized Outcome: Progressing

## 2024-07-09 NOTE — Progress Notes (Addendum)
 Pt had another episode of green emesis, Zofran  given and pt verbalized that nausea has resolved. Tube feed paused, and Leotis, MD notified.  No signs of aspiration, SpO2 100% on room air, lung sounds clear in upper and diminished in lower lobes, no signs of respiratory distress. Bowel sounds present, pt has been having bowel movements.

## 2024-07-09 NOTE — Progress Notes (Signed)
 Spoke with dialysis RN and patient will transport to HD in bed and they will move her to the chair when she arrives.

## 2024-07-10 DIAGNOSIS — I619 Nontraumatic intracerebral hemorrhage, unspecified: Secondary | ICD-10-CM | POA: Diagnosis not present

## 2024-07-10 LAB — BASIC METABOLIC PANEL WITH GFR
Anion gap: 19 — ABNORMAL HIGH (ref 5–15)
BUN: 23 mg/dL — ABNORMAL HIGH (ref 6–20)
CO2: 24 mmol/L (ref 22–32)
Calcium: 10.1 mg/dL (ref 8.9–10.3)
Chloride: 90 mmol/L — ABNORMAL LOW (ref 98–111)
Creatinine, Ser: 2.68 mg/dL — ABNORMAL HIGH (ref 0.44–1.00)
GFR, Estimated: 22 mL/min — ABNORMAL LOW (ref >60)
Glucose, Bld: 90 mg/dL (ref 70–99)
Potassium: 3.6 mmol/L (ref 3.5–5.1)
Sodium: 133 mmol/L — ABNORMAL LOW (ref 135–145)

## 2024-07-10 LAB — GLUCOSE, CAPILLARY: Glucose-Capillary: 88 mg/dL (ref 70–99)

## 2024-07-10 MED ORDER — AMLODIPINE BESYLATE 5 MG PO TABS
5.0000 mg | ORAL_TABLET | Freq: Once | ORAL | Status: AC
  Administered 2024-07-10: 5 mg via ORAL
  Filled 2024-07-10: qty 1

## 2024-07-10 MED ORDER — CARVEDILOL 12.5 MG PO TABS
25.0000 mg | ORAL_TABLET | Freq: Two times a day (BID) | ORAL | Status: DC
  Administered 2024-07-10 – 2024-08-07 (×47): 25 mg
  Filled 2024-07-10 (×48): qty 2

## 2024-07-10 NOTE — Progress Notes (Addendum)
 Nutrition Follow-up  DOCUMENTATION CODES:   Non-severe (moderate) malnutrition in context of chronic illness  INTERVENTION:  Increase GJ-TF Nepro back to goal rate of 45 mL/hr. Minimize water  flushes in light of persistent hyponatremia. 30 mL every 4 hours is sufficient to maintain tube patency. TF regimen provides 1944 Kcals, 87 g protein, 173 g carbohydrates, and 785 mL free water  daily, which meets 100% of the patient's estimated nutrition needs. Continue 1 packet Nutri[employer] Fiber TID via feeding tube for diarrhea. Continue daily multivitamin via feeding tube.   NUTRITION DIAGNOSIS:   Moderate Malnutrition related to chronic illness (ESRD on HD/DM and gastroparesis) as evidenced by severe muscle depletion, mild fat depletion. - remains applicable   GOAL:   Patient will meet greater than or equal to 90% of their needs - progressing   MONITOR:   Weight trends, TF tolerance, Skin, Labs, I & O's  REASON FOR ASSESSMENT:   Consult Enteral/tube feeding initiation and management (patient not tolerating TF)  ASSESSMENT:   43 year old female who presented as a Code Stroke with IVH. 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 Recent admissions 6/11-7/28 and 7/29-8/13 for ICH with GJ tube.  6/11 - 7/29: Hospital admission, discharge to [facility] 8/12 - G tube converted to GJ  9/16 - Admitted, EVD placed, intubated 9/21 - switched to Nepro formula; FMS placed 9/22 - Extubated  9/23 - adjusted rate of Nepro to meet needs (was previously exceeding) 9/25 - D/C Nepro, Started Osmolite 1.5 10/3 - Renal changed TF to Nepro due to higher K (likely due to no HD over weekend) 10/15 s/p conversion from EVD to VP shunt  10/16 - Transferred to PCU  Update: Received consult for patient not tolerating TF. RN notes green  emesis yesterday morning, Zofran  was given and patient verbalized that nausea resolved. No signs of aspiration or respiratory distress per RN with +bowel sounds and +BM. TF was paused and resumed at trickle rate of 20 mL/hr later in the evening. D/W RN - the patient has been tolerating TF and provider has given OK to increase slowly back up to goal. Type 6 BMs noted.   Scheduled Meds:  carvedilol   25 mg Per Tube BID WC   Chlorhexidine  Gluconate Cloth  6 each Topical Q0600   cinacalcet   30 mg Oral Q M,W,F   cloNIDine   0.3 mg Per Tube Once per day on Sunday Tuesday Thursday Saturday   cycloSPORINE   200 mg Per Tube QPM   cycloSPORINE   225 mg Per Tube Daily   darbepoetin (ARANESP ) injection - DIALYSIS  150 mcg Subcutaneous Q Thu-1800   famotidine   10 mg Per Tube QHS   feeding supplement (NEPRO CARB STEADY)  1,000 mL Per Tube Q24H   fiber  1 packet Per Tube TID   gabapentin   100 mg Per Tube Daily   lacosamide   100 mg Per Tube BID   liver oil-zinc  oxide   Topical TID   multivitamin with minerals  1 tablet Per Tube Daily   mycophenolate   500 mg Per Tube BID   mouth rinse  15 mL Mouth Rinse 4 times per day   oxidized cellulose  1 each Topical Once   predniSONE   15 mg Per Tube Q breakfast    Diet Order     None      Meal Intake: N/A  Labs:     Latest  Ref Rng & Units 07/10/2024    8:11 AM 07/09/2024    1:25 AM 07/08/2024    6:34 AM  CMP  Glucose 70 - 99 mg/dL 90  895  891   BUN 6 - 20 mg/dL 23  60  44   Creatinine 0.44 - 1.00 mg/dL 7.31  5.97  6.95   Sodium 135 - 145 mmol/L 133  129  133   Potassium 3.5 - 5.1 mmol/L 3.6  4.5  4.1   Chloride 98 - 111 mmol/L 90  84  89   CO2 22 - 32 mmol/L 24  27  30    Calcium  8.9 - 10.3 mg/dL 89.8  89.7  89.9     I/O: +8 L since admit  NUTRITION - FOCUSED PHYSICAL EXAM:  Flowsheet Row Most Recent Value  Orbital Region Mild depletion  Upper Arm Region Moderate depletion  Thoracic and Lumbar Region Mild depletion  Buccal Region Mild  depletion  Temple Region Severe depletion  Clavicle Bone Region Severe depletion  Clavicle and Acromion Bone Region Severe depletion  Scapular Bone Region Severe depletion  Dorsal Hand Moderate depletion  Patellar Region Severe depletion  Anterior Thigh Region Severe depletion  Posterior Calf Region Severe depletion  Edema (RD Assessment) Mild  Hair Reviewed  Eyes Unable to assess  Mouth Unable to assess  Skin Reviewed  [Closed surgical incision on head]  Nails Reviewed    EDUCATION NEEDS:   Not appropriate for education at this time  Skin:  Skin Assessment: Skin Integrity Issues: Skin Integrity Issues:: Incisions, Other (Comment) Stage I: buttocks - resolved Other: contact dermatitis buttocks  Last BM:  11/6 type 6  Height:   Ht Readings from Last 1 Encounters:  06/18/24 5' 3 (1.6 m)    Weight:    Ideal Body Weight:  52.3 kg  BMI:  Body mass index is 21.4 kg/m.  Estimated Nutritional Needs:  Kcal:  1700-1900 Protein:  80-100 grams Fluid:  1L +UOP    Leverne Ruth, MS, RDN, LDN [place]. [facility] See AMION for contact information

## 2024-07-10 NOTE — Progress Notes (Addendum)
 [facility] Progress Note   Subjective:   Seen in room - feeling ok today without CP/dyspnea. HD was done in bed yesterday, will try for recliner tomorrow. Still restless with mindless picking at skin and lines - easily to redirect though. Have to keep a close eye on her in dialysis that she doesn't touch her needles.  Objective Vitals:   07/09/24 2016 07/09/24 2346 07/10/24 0343 07/10/24 0840  BP: (!) 147/53 (!) 154/60 (!) 154/65 (!) 172/70  Pulse:  74 76 81  Resp:  18 18 18   Temp:  98.6 F (37 C) 98.7 F (37.1 C) 98.6 F (37 C)  TempSrc:  Oral Oral Oral  SpO2:  99% 97% 93%  Weight:      Height:       Physical Exam General: Chronically ill appearing woman, NAD. Room air Heart: RRR, no murmur Lungs: CTA anteriorly Abdomen: soft, PEG in place Extremities: no LE edema Dialysis Access: R AVF +t/b  Additional Objective Labs: Basic Metabolic Panel: Recent Labs  Lab 07/07/24 0220 07/08/24 0634 07/09/24 0125 07/10/24 0811  NA 128* 133* 129* 133*  K 4.5 4.1 4.5 3.6  CL 84* 89* 84* 90*  CO2 24 30 27 24   GLUCOSE 118* 108* 104* 90  BUN 98* 44* 60* 23*  CREATININE 5.24* 3.04* 4.02* 2.68*  CALCIUM  10.2 10.0 10.2 10.1  PHOS 4.1 3.8 4.6  --    CBC: Recent Labs  Lab 07/07/24 0220 07/08/24 0634 07/09/24 0125  WBC 3.8* 4.9 3.8*  HGB 8.0* 8.2* 7.5*  HCT 24.2* 25.4* 23.6*  MCV 94.9 96.6 96.7  PLT 162 161 168   Medications:   amLODipine   10 mg Oral Once per day on Sunday Tuesday Thursday Saturday   carvedilol   25 mg Per Tube 2 times per day on Sunday Tuesday Thursday Saturday   Chlorhexidine  Gluconate Cloth  6 each Topical Q0600   cinacalcet   30 mg Oral Q M,W,F   cloNIDine   0.3 mg Per Tube Once per day on Sunday Tuesday Thursday Saturday   cycloSPORINE   200 mg Per Tube QPM   cycloSPORINE   225 mg Per Tube Daily   darbepoetin (ARANESP ) injection - DIALYSIS  150 mcg Subcutaneous Q Thu-1800   famotidine   10 mg Per Tube QHS   feeding supplement (NEPRO CARB  STEADY)  1,000 mL Per Tube Q24H   fiber  1 packet Per Tube TID   gabapentin   100 mg Per Tube Daily   lacosamide   100 mg Per Tube BID   liver oil-zinc  oxide   Topical TID   multivitamin with minerals  1 tablet Per Tube Daily   mycophenolate   500 mg Per Tube BID   mouth rinse  15 mL Mouth Rinse 4 times per day   oxidized cellulose  1 each Topical Once   predniSONE   15 mg Per Tube Q breakfast    Dialysis Orders MWF - NW 3:45hr, 400/A1.5, EDW 55.2kg, 2K/2Ca bath, AVF, no heparin  - Prev on Mircera 150mcg IV q 2 weeks - No VDRA   Assessment/Plan: Acute R intracranial hemorrhage: 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 Needs ongoing rehab. ESRD: Continue HD on MWF schedule - for HD tomorrow, in recliner. HTN/volume: BP higher today, no LE edema, UF as tolerated. Only getting BP meds on non-HD days -> will try to simplify -> give amlod 5mg  today then d/c and change Coreg  to BID everyday. Anemia of ESRD: Hgb 7.5 - down again - continue Aranesp  150mcg q Thursday. Secondary HPTH:  CorrCa high, Phos ok. Continue sensipar , not on VDRA. Nutrition: Alb low, continue Nepro TF. Consider repeat swallowing study in future. 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 Hx T1D/gastroparesis Buttocks wound/?yeast: Using antifungal powder, improving. Dispo: Insurance declined LTAC, will be going to SNF. Needs to be able to dialyze in chair prior to discharge - try tomorrow.   Izetta Boehringer, PA-C 07/10/2024, 9:27 AM   [facility]    Seen and examined independently this AM.  Agree with note and exam as documented above by physician extender and as noted here.  Nausea better. Spoke with mother at bedside.  Did ok with sitting in a chair and she's ok with trying HD in a chair   General adult female in bed in no acute distress HEENT normocephalic atraumatic extraocular movements intact sclera anicteric Neck supple trachea midline Lungs clear to auscultation  bilaterally normal work of breathing at rest on room air Heart S1S2 no rub Abdomen soft nontender nondistended Extremities no edema  Psych normal mood and affect Access RUE AVF with bruit and thrill    ESRD on HD - MWF - try HD in a chair for Friday   HTN - trying to simplify her BP regimen as above.  Next adjustment would be to decrease her clonidine      Anemia of ESRD - on aranesp    Per HD SW she was denied LTAC and so they are looking for a SNF  Katheryn JAYSON Saba, MD 07/10/2024  2:11 PM

## 2024-07-10 NOTE — Progress Notes (Signed)
 PROGRESS NOTE    [name] [name]  FMW:[id number] DOB: [birth year] DOA: 05/19/2024 PCP: [facility]   Brief Narrative:  This 43-yrs-old female with PMH significant for HTN, Ischemic and hemorrhagic CVA, type 1 diabetes, gastroparesis, kidney and pancreas transplant 2014, subsequent failure of renal transplant, now ESRD on dialysis, anxiety, narcotic abuse. Presented to the [facility] ED from dialysis as code stroke on 9/15; subsequently found to have hemorrhagic stroke. Neurology, Neurosurgery, PCCM consulted. EVD was placed on the left side. Patient admitted to neuro ICU. She was intubated on admission. She had prolonged ICU stay with EVD drain which was changed to VP shunt on 10/15.  Extubated on 9/23.  Transferred to [facility] service on 10/17.     She was recently discharged from here in August after she was managed for ischemic/hemorrhagic stroke and was discharged to LTAC.   Important events:  9/15 admitted with ICH plus IVH, EVD was placed for hydrocephalus, intubated 9/22 palliative care meeting.  Family is insistent that they want full scope of care 9/23 Extubated 10/1 -developed fevers pancultures drawn and broad-spectrum antibiotics started 10/2 became afebrile after starting antibiotics, did not tolerate raising EVD 10/6 EVD was raised from 10 to 30 cm water , tolerating well, remained afebrile 10/7 EVD is at 30 cm water , CT scan will be done tomorrow morning, no overnight issues 10/8 she became somnolent, CT head showed increasing hydrocephalus, EVD was titrated down to 10 cm of water  with improvement in mental status 10/15 VP shunt placement 10/17 transferred to progressive unit with [name]     Assessment & Plan:   Principal Problem:   Hemorrhagic stroke [facility]) Active Problems:   Moderate protein-calorie malnutrition   Pressure injury of skin   Nontraumatic subcortical hemorrhage of right cerebral hemisphere [facility])   Acute right parietotemporal intraparenchymal  hemorrhage / Intraventricular hemorrhage Uncontrolled hypertension Obstructive hydrocephalus s/p EVD changed to VP shunt: Seizure disorder s/p VP shunt placement on 06/18/2024 by Dr. Mavis.  Remains stable on Vimpat  100 mg twice daily.  Neurosurgery following. Per them, no issue after the shunt.   ESRD on dialysis: History of failed renal transplant: Secondary hyperparathyroidism: Nephrology following. Patient on CellCept  and prednisone .  Dialyzed on MWF schedule. Has AV fistula on the right upper extremity. Needs to be able to dialyze in chair prior to discharge. Next HD tomorrow. Have to keep a close eye on her in dialysis that she does not touch needles.  Acute bronchitis: Resolved.    Intermittent low-grade fever:  Thought to be secondary to intracranial hemorrhage. Previous blood cultures showed contamination.  S/p antibiotics for respiratory cultures positive for Serratia and Klebsiella.  Repeat cultures negative thus far. Patient afebrile for several days.   History of pancreatic transplantation:  Continue cyclosporine , mycophenolate , prednisone .   Hypertension:  Patient currently on Coreg , amlodipine  and clonidine  on nondialysis days.    Anemia of chronic disease:  No signs of active bleeding. Hemoglobin stable.   Type 1 diabetes with vasculopathy/gastroparesis:  Not on long-term insulin  anymore due to pancreatic transplant.  Monitor blood sugars. On sliding scale.   Moderate protein calorie malnutrition/dysphagia:  Has PEG tube. Continue tube feeds.  SLP and dietitian following.    Stage I sacral pressure ulcer present on admission: Continue wound care   Debility/deconditioning/disposition/goals of care:  Young patient with multiple comorbidities.  Palliative care consulted for goals of care conversations, family insist she remains full code.  Palliative has signed off 10/17.  Seen by PT OT.  They recommended CIR.  Reviewed by CIR, they recommend LTAC.  Peer to  peer with insurance company was done on 06/25/2024, and they denied LTAC.  They recommended LTC.  Appeal for LTAC denied again.  TOC working on placing her to LTC.   Diarrhea: Continue Imodium  as needed.  Flexi-Seal was inserted due to continuous loose stools.   DVT prophylaxis: SCDs Code Status: Full code Family Communication:Mother at bed side. Disposition Plan:    Status is: Inpatient Remains inpatient appropriate because:    Needs to be able to dialyze in chair prior to discharge. Next HD in recliner tomorrow in recliner.   Consultants:  Nephrology  Procedures:  Antimicrobials:  Anti-infectives (From admission, onward)    Start     Dose/Rate Route Frequency Ordered Stop   07/08/24 1530  fluconazole  (DIFLUCAN ) tablet 150 mg        150 mg Oral  Once 07/08/24 1437 07/08/24 1544   06/18/24 2030  ceFAZolin  (ANCEF ) IVPB 2g/100 mL premix  Status:  Discontinued        2 g 200 mL/hr over 30 Minutes Intravenous Every 8 hours 06/18/24 1454 06/19/24 1608   06/18/24 0600  ceFAZolin  (ANCEF ) IVPB 2g/100 mL premix        2 g 200 mL/hr over 30 Minutes Intravenous On call to O.R. 06/15/24 1152 06/18/24 1230   06/06/24 1400  piperacillin -tazobactam (ZOSYN ) IVPB 2.25 g        2.25 g 100 mL/hr over 30 Minutes Intravenous Every 8 hours 06/06/24 1017 06/09/24 0538   06/06/24 1200  vancomycin  (VANCOREADY) IVPB 500 mg/100 mL        500 mg 100 mL/hr over 60 Minutes Intravenous Every M-W-F (Hemodialysis) 06/04/24 1745 06/06/24 1900   06/04/24 1845  vancomycin  (VANCOCIN ) IVPB 1000 mg/200 mL premix        1,000 mg 200 mL/hr over 60 Minutes Intravenous  Once 06/04/24 1745 06/04/24 1915   06/04/24 1830  ceFEPIme  (MAXIPIME ) 2 g in sodium chloride  0.9 % 100 mL IVPB  Status:  Discontinued        2 g 200 mL/hr over 30 Minutes Intravenous Every M-W-F (Hemodialysis) 06/04/24 1743 06/06/24 1017   05/27/24 1015  cefTRIAXone  (ROCEPHIN ) 2 g in sodium chloride  0.9 % 100 mL IVPB        2 g 200 mL/hr over 30  Minutes Intravenous Every 24 hours 05/27/24 0918 05/29/24 1014   05/23/24 1530  ceFEPIme  (MAXIPIME ) 1 g in sodium chloride  0.9 % 100 mL IVPB  Status:  Discontinued        1 g 200 mL/hr over 30 Minutes Intravenous Every 24 hours 05/23/24 1433 05/27/24 0918   05/22/24 2200  Ampicillin -Sulbactam (UNASYN ) 3 g in sodium chloride  0.9 % 100 mL IVPB  Status:  Discontinued        3 g 200 mL/hr over 30 Minutes Intravenous Every 24 hours 05/22/24 0834 05/23/24 1433   05/22/24 0930  Ampicillin -Sulbactam (UNASYN ) 3 g in sodium chloride  0.9 % 100 mL IVPB        3 g 200 mL/hr over 30 Minutes Intravenous  Once 05/22/24 0834 05/22/24 1900      Subjective: Patient was seen and examined at bedside. Overnight events noted. Patient had hemodialysis while in the bed yesterday. Needs to be able to dialyze in chair prior to discharge.  Plan for hemodialysis in the chair tomorrow.  Objective: Vitals:   07/09/24 2346 07/10/24 0343 07/10/24 0840 07/10/24 1122  BP: (!) 154/60 (!) 154/65 (!) 172/70 131/62  Pulse: 74  76 81 75  Resp: 18 18 18 16   Temp: 98.6 F (37 C) 98.7 F (37.1 C) 98.6 F (37 C) 98.2 F (36.8 C)  TempSrc: Oral Oral Oral Oral  SpO2: 99% 97% 93% 93%  Weight:      Height:        Intake/Output Summary (Last 24 hours) at 07/10/2024 1217 Last data filed at 07/10/2024 0840 Gross per 24 hour  Intake 0 ml  Output 200 ml  Net -200 ml    Filed Weights   07/07/24 0818 07/08/24 0500 07/09/24 1400  Weight: 52 kg 52.4 kg 54.8 kg    Examination:  General exam: Appears calm and comfortable, not in any acute distress. Respiratory system: CTA Bilaterally. Respiratory effort normal. RR 14 Cardiovascular system: S1 & S2 heard, RRR. No JVD, murmurs, rubs, gallops or clicks.  Gastrointestinal system: Abdomen is nondistended, soft and nontender. Normal bowel sounds heard. Central nervous system: Alert and oriented x 3. No focal neurological deficits. Extremities: No edema, No cyanosis, No  clubbing Skin: No rashes, lesions or ulcers Psychiatry: Judgement and insight appear normal. Mood & affect appropriate.    Data Reviewed: I have personally reviewed following labs and imaging studies  CBC: Recent Labs  Lab 07/07/24 0220 07/08/24 0634 07/09/24 0125  WBC 3.8* 4.9 3.8*  HGB 8.0* 8.2* 7.5*  HCT 24.2* 25.4* 23.6*  MCV 94.9 96.6 96.7  PLT 162 161 168   Basic Metabolic Panel: Recent Labs  Lab 07/05/24 1152 07/07/24 0220 07/08/24 0634 07/09/24 0125 07/10/24 0811  NA 129* 128* 133* 129* 133*  K 4.3 4.5 4.1 4.5 3.6  CL 88* 84* 89* 84* 90*  CO2 25 24 30 27 24   GLUCOSE 169* 118* 108* 104* 90  BUN 49* 98* 44* 60* 23*  CREATININE 3.12* 5.24* 3.04* 4.02* 2.68*  CALCIUM  9.9 10.2 10.0 10.2 10.1  MG  --  3.3* 2.7* 3.2*  --   PHOS  --  4.1 3.8 4.6  --    GFR: Estimated Creatinine Clearance: 22.6 mL/min (A) (by C-G formula based on SCr of 2.68 mg/dL (H)). Liver Function Tests: No results for input(s): AST, ALT, ALKPHOS, BILITOT, PROT, ALBUMIN  in the last 168 hours.  No results for input(s): LIPASE, AMYLASE in the last 168 hours. No results for input(s): AMMONIA in the last 168 hours. Coagulation Profile: No results for input(s): INR, PROTIME in the last 168 hours. Cardiac Enzymes: No results for input(s): CKTOTAL, CKMB, CKMBINDEX, TROPONINI in the last 168 hours. BNP (last 3 results) No results for input(s): PROBNP in the last 8760 hours. HbA1C: No results for input(s): HGBA1C in the last 72 hours. CBG: Recent Labs  Lab 07/09/24 0739 07/09/24 1632 07/09/24 2035 07/09/24 2341 07/10/24 0339  GLUCAP 93 122* 86 86 88   Lipid Profile: No results for input(s): CHOL, HDL, LDLCALC, TRIG, CHOLHDL, LDLDIRECT in the last 72 hours. Thyroid Function Tests: No results for input(s): TSH, T4TOTAL, FREET4, T3FREE, THYROIDAB in the last 72 hours. Anemia Panel: No results for input(s): VITAMINB12, FOLATE,  FERRITIN, TIBC, IRON , RETICCTPCT in the last 72 hours. Sepsis Labs: No results for input(s): PROCALCITON, LATICACIDVEN in the last 168 hours.  No results found for this or any previous visit (from the past 240 hours).  Radiology Studies: No results found. Scheduled Meds:  carvedilol   25 mg Per Tube BID WC   Chlorhexidine  Gluconate Cloth  6 each Topical Q0600   cinacalcet   30 mg Oral Q M,W,F   cloNIDine   0.3 mg Per  Tube Once per day on Sunday Tuesday Thursday Saturday   cycloSPORINE   200 mg Per Tube QPM   cycloSPORINE   225 mg Per Tube Daily   darbepoetin (ARANESP ) injection - DIALYSIS  150 mcg Subcutaneous Q Thu-1800   famotidine   10 mg Per Tube QHS   feeding supplement (NEPRO CARB STEADY)  1,000 mL Per Tube Q24H   fiber  1 packet Per Tube TID   gabapentin   100 mg Per Tube Daily   lacosamide   100 mg Per Tube BID   liver oil-zinc  oxide   Topical TID   multivitamin with minerals  1 tablet Per Tube Daily   mycophenolate   500 mg Per Tube BID   mouth rinse  15 mL Mouth Rinse 4 times per day   oxidized cellulose  1 each Topical Once   predniSONE   15 mg Per Tube Q breakfast   Continuous Infusions:   LOS: 52 days    Time spent: 35 mins    Darcel Dawley, MD [name]   If 7PM-7AM, please contact night-coverage

## 2024-07-10 NOTE — Progress Notes (Signed)
 Physical Therapy Treatment Patient Details Name: [name] [name] MRN: [id number] DOB: [birth year] Today's Date: 07/10/2024   History of Present Illness 43 yo female presents 05/19/24 for left gaze and L side weakness. 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 Intubated 9/15-9/23. EVD 9/15- 10/15; s/p shunt 10/15. S/p laparoscopy with intra-abdominal assistance ventricular-peritoneal shunt placement 10/15. PMH: HTN, ESRD on HD s/p renal and pancreatic transplants, DM, gastroparesis, multiple strokes (ischemic and hemorrhagic) with prepontine SAH, hydrocephalus requiring EVD placement   PT Comments  Pt alert upon arrival with mother present and agreeable to PT session. Pt had a bowel movement and required TotalA for pericare. Able to perform multiple mini-bridges in supine for placement/removal of bed pan and for pericare. Pt was tearful and distracted by pain in bottom throughout session. Increased difficulty trying to console and re-direct pt. After increased time in sitting, pt was able to perform stand-pivot with MaxA and face to face approach using gait belt. Assist needed to reposition in recliner with use of bed pad.  Recommending post-acute rehab with TOC following. Acute PT to follow and continue working towards goals.    If plan is discharge home, recommend the following: A lot of help with walking and/or transfers;A lot of help with bathing/dressing/bathroom;Assistance with cooking/housework;Direct supervision/assist for medications management;Direct supervision/assist for financial management;Assist for transportation;Help with stairs or ramp for entrance   Can travel by private vehicle     No  Equipment Recommendations  Hospital bed;Hoyer lift;Wheelchair (measurements PT);Wheelchair cushion (measurements PT);BSC/3in1       Precautions / Restrictions  Precautions Precautions: Fall Recall of Precautions/Restrictions: Impaired Precaution/Restrictions Comments: PEG Restrictions Weight Bearing Restrictions Per Provider Order: No     Mobility  Bed Mobility Overal bed mobility: Needs Assistance Bed Mobility: Rolling, Sidelying to Sit Rolling: Mod assist Sidelying to sit: Used rails, Max assist    General bed mobility comments: ModA to roll to the left with MaxA to raise trunk. Pt attempting to return to supine with assist needed to maintain upright posture. Distracted by pain in bottom    Transfers Overall transfer level: Needs assistance Equipment used: None (face to face transfer) Transfers: Bed to chair/wheelchair/BSC, Sit to/from Stand Sit to Stand: Mod assist Stand pivot transfers: Max assist    General transfer comment: face to face transfer with use of gait belt and B knees initially blocked. ModA to raise up and MaxA to pivot to the recliner    Modified Rankin (Stroke Patients Only) Modified Rankin (Stroke Patients Only) Pre-Morbid Rankin Score: Moderately severe disability Modified Rankin: Severe disability     Balance Overall balance assessment: Needs assistance Sitting-balance support: Bilateral upper extremity supported, Feet supported Sitting balance-Leahy Scale: Poor Sitting balance - Comments: right lateral lean as pt was attempting to return to supine Postural control: Right lateral lean Standing balance support: Reliant on assistive device for balance Standing balance-Leahy Scale: Poor Standing balance comment: reliant on external support       Communication Communication Communication: Impaired Factors Affecting Communication: Difficulty expressing self;Reduced clarity of speech  Cognition Arousal: Alert Behavior During Therapy: Restless, Lability   PT - Cognitive impairments: Awareness, Memory, Attention, Initiation, Sequencing, Safety/Judgement, Problem solving, Orientation    PT - Cognition  Comments: Pt emotionally labile and distracted by pain in bottom. When asked later why the pt was crying she stated I don't know. Mom helpful in consoling and comforting pt Following commands: Impaired Following  commands impaired: Follows one step commands inconsistently, Follows one step commands with increased time    Cueing Cueing Techniques: Verbal cues, Gestural cues, Tactile cues, Visual cues  Exercises Other Exercises Other Exercises: x8 mini bridges in supine    General Comments General comments (skin integrity, edema, etc.): Mother present and supportive during session      Pertinent Vitals/Pain Pain Assessment Pain Assessment: Faces Faces Pain Scale: Hurts whole lot Pain Location: bottom Pain Descriptors / Indicators: Grimacing, Guarding, Crying Pain Intervention(s): Limited activity within patient's tolerance, Monitored during session, Repositioned     PT Goals (current goals can now be found in the care plan section) Acute Rehab PT Goals PT Goal Formulation: Patient unable to participate in goal setting Time For Goal Achievement: 07/24/24 Potential to Achieve Goals: Fair Progress towards PT goals: Progressing toward goals    Frequency    Min 2X/week       AM-PAC PT 6 Clicks Mobility   Outcome Measure  Help needed turning from your back to your side while in a flat bed without using bedrails?: A Lot Help needed moving from lying on your back to sitting on the side of a flat bed without using bedrails?: A Lot Help needed moving to and from a bed to a chair (including a wheelchair)?: Total Help needed standing up from a chair using your arms (e.g., wheelchair or bedside chair)?: A Lot Help needed to walk in hospital room?: Total Help needed climbing 3-5 steps with a railing? : Total 6 Click Score: 9    End of Session Equipment Utilized During Treatment: Gait belt Activity Tolerance: Patient limited by pain Patient left: in chair;with call bell/phone  within reach;with chair alarm set;with family/visitor present Nurse Communication: Mobility status;Other (comment) (pt in recliner starting at 12 pm) PT Visit Diagnosis: Unsteadiness on feet (R26.81);Other abnormalities of gait and mobility (R26.89);Muscle weakness (generalized) (M62.81);Difficulty in walking, not elsewhere classified (R26.2);Other symptoms and signs involving the nervous system (R29.898)     Time: 1137-1200 PT Time Calculation (min) (ACUTE ONLY): 23 min  Charges:    $Therapeutic Activity: 23-37 mins PT General Charges $$ ACUTE PT VISIT: 1 Visit                    [name] [name], PT, DPT Secure Chat Preferred  Rehab Office [phone]   [name] BRAVO Wendolyn 07/10/2024, 2:08 PM

## 2024-07-11 DIAGNOSIS — I619 Nontraumatic intracerebral hemorrhage, unspecified: Secondary | ICD-10-CM | POA: Diagnosis not present

## 2024-07-11 LAB — CBC
HCT: 21.1 % — ABNORMAL LOW (ref 36.0–46.0)
Hemoglobin: 7 g/dL — ABNORMAL LOW (ref 12.0–15.0)
MCH: 31.3 pg (ref 26.0–34.0)
MCHC: 33.2 g/dL (ref 30.0–36.0)
MCV: 94.2 fL (ref 80.0–100.0)
Platelets: 157 K/uL (ref 150–400)
RBC: 2.24 MIL/uL — ABNORMAL LOW (ref 3.87–5.11)
RDW: 14.6 % (ref 11.5–15.5)
WBC: 4.5 K/uL (ref 4.0–10.5)
nRBC: 0 % (ref 0.0–0.2)

## 2024-07-11 LAB — RENAL FUNCTION PANEL
Albumin: 2.7 g/dL — ABNORMAL LOW (ref 3.5–5.0)
Anion gap: 14 (ref 5–15)
BUN: 40 mg/dL — ABNORMAL HIGH (ref 6–20)
CO2: 26 mmol/L (ref 22–32)
Calcium: 9.9 mg/dL (ref 8.9–10.3)
Chloride: 89 mmol/L — ABNORMAL LOW (ref 98–111)
Creatinine, Ser: 4.13 mg/dL — ABNORMAL HIGH (ref 0.44–1.00)
GFR, Estimated: 13 mL/min — ABNORMAL LOW (ref >60)
Glucose, Bld: 133 mg/dL — ABNORMAL HIGH (ref 70–99)
Phosphorus: 4.4 mg/dL (ref 2.5–4.6)
Potassium: 3.6 mmol/L (ref 3.5–5.1)
Sodium: 129 mmol/L — ABNORMAL LOW (ref 135–145)

## 2024-07-11 MED ORDER — ONDANSETRON HCL 4 MG/2ML IJ SOLN
INTRAMUSCULAR | Status: AC
  Filled 2024-07-11: qty 2

## 2024-07-11 NOTE — Procedures (Signed)
 Seen and examined on dialysis.  Procedure supervised.  Blood pressure 127/57 and HR 73.  Tolerating goal.  Right AVF in use.   She is in a chair and doing alright .   Katheryn JAYSON Saba, MD 07/11/2024  9:08 AM

## 2024-07-11 NOTE — Progress Notes (Signed)
 PROGRESS NOTE    [name] [name]  FMW:[id number] DOB: 03/29/1981 DOA: 05/19/2024 PCP: [facility]   Brief Narrative:  This 43-yrs-old female with PMH significant for HTN, Ischemic and hemorrhagic CVA, type 1 diabetes, gastroparesis, kidney and pancreas transplant 2014, subsequent failure of renal transplant, now ESRD on dialysis, anxiety, narcotic abuse. Presented to the [facility] ED from dialysis as code stroke on 9/15; subsequently found to have hemorrhagic stroke. Neurology, Neurosurgery, PCCM consulted. EVD was placed on the left side. Patient admitted to neuro ICU. She was intubated on admission. She had prolonged ICU stay with EVD drain which was changed to VP shunt on 10/15.  Extubated on 9/23.  Transferred to [facility] on 10/17.     She was recently discharged from here in August after she was managed for ischemic/hemorrhagic stroke and was discharged to LTAC.   Important events:  9/15 admitted with ICH plus IVH, EVD was placed for hydrocephalus, intubated 9/22 palliative care meeting.  Family is insistent that they want full scope of care 9/23 Extubated 10/1 -developed fevers pancultures drawn and broad-spectrum antibiotics started 10/2 became afebrile after starting antibiotics, did not tolerate raising EVD 10/6 EVD was raised from 10 to 30 cm water , tolerating well, remained afebrile 10/7 EVD is at 30 cm water , CT scan will be done tomorrow morning, no overnight issues 10/8 she became somnolent, CT head showed increasing hydrocephalus, EVD was titrated down to 10 cm of water  with improvement in mental status 10/15 VP shunt placement 10/17 transferred to progressive unit with [name]     Assessment & Plan:   Principal Problem:   Hemorrhagic stroke [facility]) Active Problems:   Moderate protein-calorie malnutrition   Pressure injury of skin   Nontraumatic subcortical hemorrhage of right cerebral hemisphere [facility])   Acute right parietotemporal intraparenchymal  hemorrhage / Intraventricular hemorrhage: Uncontrolled hypertension: Obstructive hydrocephalus s/p EVD changed to VP shunt: Seizure disorder s/p VP shunt placement on 06/18/2024 by Dr. Mavis.  Remains stable on Vimpat  100 mg twice daily.  Neurosurgery following. Per them, no issue after the shunt.   ESRD on dialysis: History of failed renal transplant: Secondary hyperparathyroidism: Nephrology following. Patient on CellCept  and prednisone .  Dialyzed on MWF schedule. Has AV fistula on the right upper extremity. Needs to be able to dialyze in chair prior to discharge. Next HD today. Have to keep a close eye on her in dialysis that she does not touch needles. Patient is getting hemodialysis in the recliner so far doing fine.  Acute bronchitis: Resolved.    Intermittent low-grade fever:  Thought to be secondary to intracranial hemorrhage. Previous blood cultures showed contamination.  S/p antibiotics for respiratory cultures positive for Serratia and Klebsiella.  Repeat cultures negative thus far. Patient afebrile for several days.   History of pancreatic transplantation:  Continue cyclosporine , mycophenolate , prednisone .   Hypertension:  Patient currently on Coreg , amlodipine  and clonidine  on nondialysis days.    Anemia of chronic disease:  No signs of active bleeding. Hemoglobin stable.   Type 1 diabetes with vasculopathy/gastroparesis:  Not on long-term insulin  anymore due to pancreatic transplant.  Monitor blood sugars. On sliding scale.   Moderate protein calorie malnutrition/dysphagia:  Has PEG tube. Continue tube feeds.  SLP and dietitian following.    Stage I sacral pressure ulcer present on admission: Continue wound care   Debility/deconditioning/disposition/goals of care:  Young patient with multiple comorbidities.  Palliative care consulted for goals of care conversations, family insist she remains full code.  Palliative has signed off  10/17.  Seen by PT OT.  They  recommended CIR.  Reviewed by CIR, they recommend LTAC.  Peer to peer with insurance company was done on 06/25/2024, and they denied LTAC.  They recommended LTC.  Appeal for LTAC denied again.  TOC working on placing her to LTC.   Diarrhea: Continue Imodium  as needed.   DVT prophylaxis: SCDs Code Status: Full code Family Communication:Mother at bed side. Disposition Plan:    Status is: Inpatient Remains inpatient appropriate because:    Needs to be able to dialyze in chair prior to discharge. HD in recliner today in recliner.   Consultants:  Nephrology  Procedures:  Antimicrobials:  Anti-infectives (From admission, onward)    Start     Dose/Rate Route Frequency Ordered Stop   07/08/24 1530  fluconazole  (DIFLUCAN ) tablet 150 mg        150 mg Oral  Once 07/08/24 1437 07/08/24 1544   06/18/24 2030  ceFAZolin  (ANCEF ) IVPB 2g/100 mL premix  Status:  Discontinued        2 g 200 mL/hr over 30 Minutes Intravenous Every 8 hours 06/18/24 1454 06/19/24 1608   06/18/24 0600  ceFAZolin  (ANCEF ) IVPB 2g/100 mL premix        2 g 200 mL/hr over 30 Minutes Intravenous On call to O.R. 06/15/24 1152 06/18/24 1230   06/06/24 1400  piperacillin -tazobactam (ZOSYN ) IVPB 2.25 g        2.25 g 100 mL/hr over 30 Minutes Intravenous Every 8 hours 06/06/24 1017 06/09/24 0538   06/06/24 1200  vancomycin  (VANCOREADY) IVPB 500 mg/100 mL        500 mg 100 mL/hr over 60 Minutes Intravenous Every M-W-F (Hemodialysis) 06/04/24 1745 06/06/24 1900   06/04/24 1845  vancomycin  (VANCOCIN ) IVPB 1000 mg/200 mL premix        1,000 mg 200 mL/hr over 60 Minutes Intravenous  Once 06/04/24 1745 06/04/24 1915   06/04/24 1830  ceFEPIme  (MAXIPIME ) 2 g in sodium chloride  0.9 % 100 mL IVPB  Status:  Discontinued        2 g 200 mL/hr over 30 Minutes Intravenous Every M-W-F (Hemodialysis) 06/04/24 1743 06/06/24 1017   05/27/24 1015  cefTRIAXone  (ROCEPHIN ) 2 g in sodium chloride  0.9 % 100 mL IVPB        2 g 200 mL/hr over 30  Minutes Intravenous Every 24 hours 05/27/24 0918 05/29/24 1014   05/23/24 1530  ceFEPIme  (MAXIPIME ) 1 g in sodium chloride  0.9 % 100 mL IVPB  Status:  Discontinued        1 g 200 mL/hr over 30 Minutes Intravenous Every 24 hours 05/23/24 1433 05/27/24 0918   05/22/24 2200  Ampicillin -Sulbactam (UNASYN ) 3 g in sodium chloride  0.9 % 100 mL IVPB  Status:  Discontinued        3 g 200 mL/hr over 30 Minutes Intravenous Every 24 hours 05/22/24 0834 05/23/24 1433   05/22/24 0930  Ampicillin -Sulbactam (UNASYN ) 3 g in sodium chloride  0.9 % 100 mL IVPB        3 g 200 mL/hr over 30 Minutes Intravenous  Once 05/22/24 0834 05/22/24 1900      Subjective: Patient was seen and examined at HD suite. Overnight events noted. Patient is getting hemodialysis in the recliner.  So far doing fine.  Objective: Vitals:   07/11/24 0930 07/11/24 1000 07/11/24 1030 07/11/24 1100  BP: (!) 162/61 (!) 154/70 (!) 146/55 (!) 159/61  Pulse: 62 73 72 72  Resp: 13 15 11 11   Temp:  TempSrc:      SpO2: 99% 94% 100% 96%  Weight:      Height:        Intake/Output Summary (Last 24 hours) at 07/11/2024 1114 Last data filed at 07/10/2024 1500 Gross per 24 hour  Intake 364.5 ml  Output --  Net 364.5 ml    Filed Weights   07/08/24 0500 07/09/24 1400 07/11/24 0500  Weight: 52.4 kg 54.8 kg 55 kg    Examination:  General exam: Appears calm and comfortable, not in any acute distress. Respiratory system: CTA Bilaterally. Respiratory effort normal. RR 15 Cardiovascular system: S1 & S2 heard, RRR. No JVD, murmurs, rubs, gallops or clicks.  Gastrointestinal system: Abdomen is nondistended, soft and nontender. Normal bowel sounds heard. Central nervous system: Alert and oriented x 3. No focal neurological deficits. Extremities: No edema, No cyanosis, No clubbing Skin: No rashes, lesions or ulcers Psychiatry: Judgement and insight appear normal. Mood & affect appropriate.    Data Reviewed: I have personally reviewed  following labs and imaging studies  CBC: Recent Labs  Lab 07/07/24 0220 07/08/24 0634 07/09/24 0125 07/11/24 0700  WBC 3.8* 4.9 3.8* 4.5  HGB 8.0* 8.2* 7.5* 7.0*  HCT 24.2* 25.4* 23.6* 21.1*  MCV 94.9 96.6 96.7 94.2  PLT 162 161 168 157   Basic Metabolic Panel: Recent Labs  Lab 07/07/24 0220 07/08/24 0634 07/09/24 0125 07/10/24 0811 07/11/24 0700  NA 128* 133* 129* 133* 129*  K 4.5 4.1 4.5 3.6 3.6  CL 84* 89* 84* 90* 89*  CO2 24 30 27 24 26   GLUCOSE 118* 108* 104* 90 133*  BUN 98* 44* 60* 23* 40*  CREATININE 5.24* 3.04* 4.02* 2.68* 4.13*  CALCIUM  10.2 10.0 10.2 10.1 9.9  MG 3.3* 2.7* 3.2*  --   --   PHOS 4.1 3.8 4.6  --  4.4   GFR: Estimated Creatinine Clearance: 14.7 mL/min (A) (by C-G formula based on SCr of 4.13 mg/dL (H)). Liver Function Tests: Recent Labs  Lab 07/11/24 0700  ALBUMIN  2.7*    No results for input(s): LIPASE, AMYLASE in the last 168 hours. No results for input(s): AMMONIA in the last 168 hours. Coagulation Profile: No results for input(s): INR, PROTIME in the last 168 hours. Cardiac Enzymes: No results for input(s): CKTOTAL, CKMB, CKMBINDEX, TROPONINI in the last 168 hours. BNP (last 3 results) No results for input(s): PROBNP in the last 8760 hours. HbA1C: No results for input(s): HGBA1C in the last 72 hours. CBG: Recent Labs  Lab 07/09/24 0739 07/09/24 1632 07/09/24 2035 07/09/24 2341 07/10/24 0339  GLUCAP 93 122* 86 86 88   Lipid Profile: No results for input(s): CHOL, HDL, LDLCALC, TRIG, CHOLHDL, LDLDIRECT in the last 72 hours. Thyroid Function Tests: No results for input(s): TSH, T4TOTAL, FREET4, T3FREE, THYROIDAB in the last 72 hours. Anemia Panel: No results for input(s): VITAMINB12, FOLATE, FERRITIN, TIBC, IRON , RETICCTPCT in the last 72 hours. Sepsis Labs: No results for input(s): PROCALCITON, LATICACIDVEN in the last 168 hours.  No results found for this or  any previous visit (from the past 240 hours).  Radiology Studies: No results found. Scheduled Meds:  carvedilol   25 mg Per Tube BID WC   Chlorhexidine  Gluconate Cloth  6 each Topical Q0600   cinacalcet   30 mg Oral Q M,W,F   cloNIDine   0.3 mg Per Tube Once per day on Sunday Tuesday Thursday Saturday   cycloSPORINE   200 mg Per Tube QPM   cycloSPORINE   225 mg Per Tube Daily  darbepoetin (ARANESP ) injection - DIALYSIS  150 mcg Subcutaneous Q Thu-1800   famotidine   10 mg Per Tube QHS   feeding supplement (NEPRO CARB STEADY)  1,000 mL Per Tube Q24H   fiber  1 packet Per Tube TID   gabapentin   100 mg Per Tube Daily   lacosamide   100 mg Per Tube BID   liver oil-zinc  oxide   Topical TID   multivitamin with minerals  1 tablet Per Tube Daily   mycophenolate   500 mg Per Tube BID   mouth rinse  15 mL Mouth Rinse 4 times per day   oxidized cellulose  1 each Topical Once   predniSONE   15 mg Per Tube Q breakfast   Continuous Infusions:   LOS: 53 days    Time spent: 35 mins    Darcel Dawley, MD [name]   If 7PM-7AM, please contact night-coverage

## 2024-07-11 NOTE — Progress Notes (Addendum)
 [name] Progress Note   Subjective:  Seen in [facility]. On dialysis in recliner. Seems to be tolerating so far. Feels ok. Denies cp, sob. Says she's ready to go home.   Objective Vitals:   07/11/24 0930 07/11/24 1000 07/11/24 1030 07/11/24 1100  BP: (!) 162/61 (!) 154/70 (!) 146/55 (!) 159/61  Pulse: 62 73 72 72  Resp: 13 15 11 11   Temp:      TempSrc:      SpO2: 99% 94% 100% 96%  Weight:      Height:       Physical Exam General: Chronically ill appearing woman, NAD. Room air Heart: RRR, no murmur Lungs: CTA anteriorly Abdomen: soft, PEG in place Extremities: no LE edema Dialysis Access: R AVF +t/b  Additional Objective Labs: Basic Metabolic Panel: Recent Labs  Lab 07/08/24 0634 07/09/24 0125 07/10/24 0811 07/11/24 0700  NA 133* 129* 133* 129*  K 4.1 4.5 3.6 3.6  CL 89* 84* 90* 89*  CO2 30 27 24 26   GLUCOSE 108* 104* 90 133*  BUN 44* 60* 23* 40*  CREATININE 3.04* 4.02* 2.68* 4.13*  CALCIUM  10.0 10.2 10.1 9.9  PHOS 3.8 4.6  --  4.4   CBC: Recent Labs  Lab 07/07/24 0220 07/08/24 0634 07/09/24 0125 07/11/24 0700  WBC 3.8* 4.9 3.8* 4.5  HGB 8.0* 8.2* 7.5* 7.0*  HCT 24.2* 25.4* 23.6* 21.1*  MCV 94.9 96.6 96.7 94.2  PLT 162 161 168 157   Medications:   carvedilol   25 mg Per Tube BID WC   Chlorhexidine  Gluconate Cloth  6 each Topical Q0600   cinacalcet   30 mg Oral Q M,W,F   cloNIDine   0.3 mg Per Tube Once per day on Sunday Tuesday Thursday Saturday   cycloSPORINE   200 mg Per Tube QPM   cycloSPORINE   225 mg Per Tube Daily   darbepoetin (ARANESP ) injection - DIALYSIS  150 mcg Subcutaneous Q Thu-1800   famotidine   10 mg Per Tube QHS   feeding supplement (NEPRO CARB STEADY)  1,000 mL Per Tube Q24H   fiber  1 packet Per Tube TID   gabapentin   100 mg Per Tube Daily   lacosamide   100 mg Per Tube BID   liver oil-zinc  oxide   Topical TID   multivitamin with minerals  1 tablet Per Tube Daily   mycophenolate   500 mg Per Tube BID   mouth rinse  15  mL Mouth Rinse 4 times per day   oxidized cellulose  1 each Topical Once   predniSONE   15 mg Per Tube Q breakfast    Dialysis Orders MWF - NW 3:45hr, 400/A1.5, EDW 55.2kg, 2K/2Ca bath, AVF, no heparin  - Prev on Mircera 150mcg IV q 2 weeks - No VDRA   Assessment/Plan: Acute R intracranial hemorrhage: 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 Needs ongoing rehab. ESRD: Continue HD on MWF schedule - HD today.  HTN/volume: BP higher today, no LE edema, UF as tolerated. Only getting BP meds on non-HD days -> will try to simplify -> got amlod 5mg  once on 11/6 then d/c and change Coreg  to BID everyday. Anemia of ESRD: Hgb 7.0 - down again. Transfuse prn Hgb < 7.  Continue Aranesp  150mcg q Thursday - last dose 11/6 Secondary HPTH: CorrCa high, Phos ok. Continue sensipar , not on VDRA. Nutrition: Alb low, continue Nepro TF. Consider repeat swallowing study in future. 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 Hx T1D/gastroparesis Buttocks wound/?yeast: Using antifungal powder, improving. Dispo: Insurance declined  LTAC, will be going to SNF. Needs to be able to dialyze in chair prior to discharge    Maisie Ronnald Acosta PA-C [facility] 07/11/2024,11:15 AM    Seen and examined independently.  Agree with note and exam as documented above by physician extender and as noted here.  Please see my procedure note from today.   Katheryn JAYSON Saba, MD 07/11/2024  11:27 AM

## 2024-07-11 NOTE — Plan of Care (Signed)
   Problem: Education: Goal: Ability to describe self-care measures that may prevent or decrease complications (Diabetes Survival Skills Education) will improve Outcome: Progressing Goal: Individualized Educational Video(s) Outcome: Progressing   Problem: Coping: Goal: Ability to adjust to condition or change in health will improve Outcome: Progressing

## 2024-07-11 NOTE — Progress Notes (Signed)
   Providing Compassionate, Quality Care - Together   Subjective: Patient seen in dialysis. She reports no new issues.  Objective: Vital signs in last 24 hours: Temp:  [97.6 F (36.4 C)-98.6 F (37 C)] 97.8 F (36.6 C) (11/07 0730) Pulse Rate:  [61-78] 72 (11/07 1030) Resp:  [11-19] 11 (11/07 1030) BP: (121-162)/(55-70) 146/55 (11/07 1030) SpO2:  [93 %-100 %] 100 % (11/07 1030) Weight:  [55 kg] 55 kg (11/07 0500)  Intake/Output from previous day: 11/06 0701 - 11/07 0700 In: 364.5 [NG/GT:364.5] Out: 0  Intake/Output this shift: No intake/output data recorded.  Responds to voice, oriented to self Delayed responses MAE, S/S intact Incision clean, dry, and intact, covered with nonadherent dressing  Lab Results: Recent Labs    07/09/24 0125 07/11/24 0700  WBC 3.8* 4.5  HGB 7.5* 7.0*  HCT 23.6* 21.1*  PLT 168 157   BMET Recent Labs    07/10/24 0811 07/11/24 0700  NA 133* 129*  K 3.6 3.6  CL 90* 89*  CO2 24 26  GLUCOSE 90 133*  BUN 23* 40*  CREATININE 2.68* 4.13*  CALCIUM  10.1 9.9    Studies/Results: No results found.  Assessment/Plan: Patient underwent placement of VP shunt by Dr. Mavis on 06/18/2024. Her follow up scan showed improvement of ventriculomegaly.   LOS: 53 days   -Awaiting LTAC placement. -Continue supportive efforts.  I am in communication with my attending and they agree with the plan for this patient.   Gerard Beck, DNP, AGNP-C Nurse Practitioner  [facility] [address], [place], [name] [address] P: [phone]    F: [phone]  07/11/2024, 10:39 AM

## 2024-07-11 NOTE — Progress Notes (Signed)
 Occupational Therapy Treatment Patient Details Name: [name] [name] MRN: [id number] DOB: May 24, 1981 Today's Date: 07/11/2024   History of present illness 43 yo female presents 05/19/24 for left gaze and L side weakness. 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 Intubated 9/15-9/23. EVD 9/15- 10/15; s/p shunt 10/15. S/p laparoscopy with intra-abdominal assistance ventricular-peritoneal shunt placement 10/15. PMH: HTN, ESRD on HD s/p renal and pancreatic transplants, DM, gastroparesis, multiple strokes (ischemic and hemorrhagic) with prepontine SAH, hydrocephalus requiring EVD placement   OT comments  Patient received in supine and agreeable to OT treatment and transferring to dialysis recliner>  Patient demonstrating gains with bed mobility with max assist and patient's mother assisting. Patient instructed on face to face transfer to recliner with mod assist to stand and max assist for to pivot to recliner.  Due to higher surface on dialysis chair patient required max assist to scoot to back of chair. Patient with complaints of pain at bottom initially but was able to position in chair to address.  AROM to BUE performed in recliner.  Discharge recommendations continue to be appropriate.  Acute OT to continue to follow to address established goals to facilitate DC to next venue of care.        If plan is discharge home, recommend the following:  Two people to help with walking and/or transfers;Two people to help with bathing/dressing/bathroom;Assistance with cooking/housework;Assist for transportation;Help with stairs or ramp for entrance   Equipment Recommendations  Other (comment) (defer)    Recommendations for Other Services      Precautions / Restrictions Precautions Precautions: Fall Recall of Precautions/Restrictions: Impaired Precaution/Restrictions Comments: PEG        Mobility Bed Mobility Overal bed mobility: Needs Assistance Bed Mobility: Rolling, Sidelying to Sit Rolling: Mod assist Sidelying to sit: Max assist, Used rails       General bed mobility comments: patient's mother assisting with bed mobility    Transfers Overall transfer level: Needs assistance Equipment used: None (face to face transfer) Transfers: Sit to/from Stand, Bed to chair/wheelchair/BSC Sit to Stand: Mod assist Stand pivot transfers: Max assist         General transfer comment: mod assist to stand and max assist with transfer with face to face technique.  Assistance with positioning in dialysis chair     Balance Overall balance assessment: Needs assistance Sitting-balance support: Bilateral upper extremity supported, Feet supported Sitting balance-Leahy Scale: Poor Sitting balance - Comments: CGA to min assist seated on EOB Postural control: Right lateral lean Standing balance support: Reliant on assistive device for balance Standing balance-Leahy Scale: Poor Standing balance comment: reliant on therapist for support                           ADL either performed or assessed with clinical judgement   ADL Overall ADL's : Needs assistance/impaired     Grooming: Wash/dry hands;Wash/dry face;Sitting;Minimal assistance Grooming Details (indicate cue type and reason): performed in recliner, declined oral care, assistance to complete                                    Extremity/Trunk Assessment              Vision       Perception     Praxis     Communication  Communication Communication: Impaired Factors Affecting Communication: Difficulty expressing self;Reduced clarity of speech   Cognition Arousal: Alert Behavior During Therapy: Restless Cognition: Cognition impaired     Awareness: Intellectual awareness impaired Memory impairment (select all impairments): Short-term memory Attention impairment (select  first level of impairment): Focused attention Executive functioning impairment (select all impairments): Initiation, Organization, Sequencing, Problem solving OT - Cognition Comments: perseverated on pain initially but decreased once positioned for pain                 Following commands: Impaired Following commands impaired: Follows one step commands inconsistently, Follows one step commands with increased time      Cueing   Cueing Techniques: Verbal cues, Gestural cues, Tactile cues, Visual cues  Exercises Exercises: General Upper Extremity General Exercises - Upper Extremity Shoulder Flexion: AROM, Both, 10 reps, Seated Shoulder Horizontal ABduction: AROM, Both, 5 reps, Seated    Shoulder Instructions       General Comments      Pertinent Vitals/ Pain       Pain Assessment Pain Assessment: Faces Faces Pain Scale: Hurts even more Pain Location: bottom Pain Descriptors / Indicators: Discomfort, Grimacing, Guarding Pain Intervention(s): Limited activity within patient's tolerance, Monitored during session, Repositioned, RN gave pain meds during session  Home Living                                          Prior Functioning/Environment              Frequency  Min 2X/week        Progress Toward Goals  OT Goals(current goals can now be found in the care plan section)  Progress towards OT goals: Progressing toward goals  Acute Rehab OT Goals Patient Stated Goal: none stated OT Goal Formulation: Patient unable to participate in goal setting Time For Goal Achievement: 07/22/24 Potential to Achieve Goals: Good ADL Goals Pt Will Perform Grooming: with set-up;sitting Pt Will Perform Lower Body Dressing: with mod assist;sit to/from stand Pt Will Transfer to Toilet: with mod assist;stand pivot transfer;with transfer board Pt/caregiver will Perform Home Exercise Program: Both right and left upper extremity;With written HEP provided;Increased  strength Additional ADL Goal #1: pt will follow one step commands 100% of OT session to optimize functional participation in BADL Additional ADL Goal #2: pt will sustain attention to ADL task for ~2 minutes with mod cues.  Plan      Co-evaluation                 AM-PAC OT 6 Clicks Daily Activity     Outcome Measure   Help from another person eating meals?: Total Help from another person taking care of personal grooming?: A Lot Help from another person toileting, which includes using toliet, bedpan, or urinal?: Total Help from another person bathing (including washing, rinsing, drying)?: A Lot Help from another person to put on and taking off regular upper body clothing?: A Lot Help from another person to put on and taking off regular lower body clothing?: Total 6 Click Score: 9    End of Session Equipment Utilized During Treatment: Gait belt  OT Visit Diagnosis: Unsteadiness on feet (R26.81);Muscle weakness (generalized) (M62.81);History of falling (Z91.81);Other symptoms and signs involving cognitive function;Cognitive communication deficit (R41.841);Low vision, both eyes (H54.2) Symptoms and signs involving cognitive functions: Nontraumatic SAH   Activity Tolerance Patient tolerated treatment well   Patient Left  in chair;with call bell/phone within reach;with family/visitor present   Nurse Communication Mobility status        Time: [phone] OT Time Calculation (min): 24 min  Charges: OT General Charges $OT Visit: 1 Visit OT Treatments $Self Care/Home Management : 8-22 mins $Therapeutic Activity: 8-22 mins  [name], OTA Acute Rehabilitation Services  Office [phone]   [name] 07/11/2024, 11:20 AM

## 2024-07-11 NOTE — Progress Notes (Signed)
 Received patient in bed to unit.  Alert and oriented.  Informed consent signed and in chart.   TX duration:3.5 hours in dialysis chair  Patient tolerated well.  Transported back to the room  Alert, without acute distress.  Hand-off given to patient's nurse.   Access used: Right upper arm fistula Access issues: none  Total UF removed: Medication(s) given: none   07/11/24 1245  Vitals  Temp 97.6 F (36.4 C)  BP (!) 144/78  MAP (mmHg) 92  Pulse Rate 76  ECG Heart Rate 76  Resp 13  Weight  (Patient in dialysis chair)  Type of Weight Post-Dialysis  Oxygen  Therapy  SpO2 95 %  O2 Device Room Air  Patient Activity (if Appropriate) In chair  During Treatment Monitoring  Duration of HD Treatment -hour(s) 3.5 hour(s)  HD Safety Checks Performed Yes  Intra-Hemodialysis Comments Tx completed  Post Treatment  Dialyzer Clearance Clear  Liters Processed 84  Fluid Removed (mL) 500 mL  Tolerated HD Treatment Yes  AVG/AVF Arterial Site Held (minutes) 7 minutes  AVG/AVF Venous Site Held (minutes) 7 minutes  Fistula / Graft Right Upper arm Arteriovenous fistula  Placement Date/Time: (c) 12/08/20 1403   Placed prior to admission: Yes  Orientation: Right  Access Location: Upper arm  Access Type: Arteriovenous fistula  Site Condition No complications  Fistula / Graft Assessment Present;Thrill;Bruit  Status Patent;Deaccessed  Drainage Description None     Camellia Brasil LPN Kidney Dialysis Unit

## 2024-07-11 NOTE — Plan of Care (Signed)
  Problem: Education: Goal: Ability to describe self-care measures that may prevent or decrease complications (Diabetes Survival Skills Education) will improve Outcome: Progressing Goal: Individualized Educational Video(s) Outcome: Progressing   Problem: Coping: Goal: Ability to adjust to condition or change in health will improve Outcome: Progressing   Problem: Fluid Volume: Goal: Ability to maintain a balanced intake and output will improve Outcome: Progressing   Problem: Health Behavior/Discharge Planning: Goal: Ability to identify and utilize available resources and services will improve Outcome: Progressing Goal: Ability to manage health-related needs will improve Outcome: Progressing   Problem: Metabolic: Goal: Ability to maintain appropriate glucose levels will improve Outcome: Progressing   Problem: Nutritional: Goal: Maintenance of adequate nutrition will improve Outcome: Progressing Goal: Progress toward achieving an optimal weight will improve Outcome: Progressing   Problem: Skin Integrity: Goal: Risk for impaired skin integrity will decrease Outcome: Progressing   Problem: Tissue Perfusion: Goal: Adequacy of tissue perfusion will improve Outcome: Progressing   Problem: Education: Goal: Knowledge of disease or condition will improve Outcome: Progressing Goal: Knowledge of secondary prevention will improve (MUST DOCUMENT ALL) Outcome: Progressing Goal: Knowledge of patient specific risk factors will improve (DELETE if not current risk factor) Outcome: Progressing   Problem: Intracerebral Hemorrhage Tissue Perfusion: Goal: Complications of Intracerebral Hemorrhage will be minimized Outcome: Progressing   Problem: Coping: Goal: Will verbalize positive feelings about self Outcome: Progressing Goal: Will identify appropriate support needs Outcome: Progressing   Problem: Health Behavior/Discharge Planning: Goal: Ability to manage health-related needs will  improve Outcome: Progressing Goal: Goals will be collaboratively established with patient/family Outcome: Progressing   Problem: Self-Care: Goal: Ability to participate in self-care as condition permits will improve Outcome: Progressing Goal: Verbalization of feelings and concerns over difficulty with self-care will improve Outcome: Progressing Goal: Ability to communicate needs accurately will improve Outcome: Progressing   Problem: Nutrition: Goal: Risk of aspiration will decrease Outcome: Progressing Goal: Dietary intake will improve Outcome: Progressing   Problem: Education: Goal: Knowledge of General Education information will improve Description: Including pain rating scale, medication(s)/side effects and non-pharmacologic comfort measures Outcome: Progressing   Problem: Health Behavior/Discharge Planning: Goal: Ability to manage health-related needs will improve Outcome: Progressing   Problem: Clinical Measurements: Goal: Ability to maintain clinical measurements within normal limits will improve Outcome: Progressing Goal: Will remain free from infection Outcome: Progressing Goal: Diagnostic test results will improve Outcome: Progressing Goal: Respiratory complications will improve Outcome: Progressing Goal: Cardiovascular complication will be avoided Outcome: Progressing   Problem: Activity: Goal: Risk for activity intolerance will decrease Outcome: Progressing   Problem: Nutrition: Goal: Adequate nutrition will be maintained Outcome: Progressing   Problem: Coping: Goal: Level of anxiety will decrease Outcome: Progressing   Problem: Elimination: Goal: Will not experience complications related to bowel motility Outcome: Progressing   Problem: Pain Managment: Goal: General experience of comfort will improve and/or be controlled Outcome: Progressing   Problem: Safety: Goal: Ability to remain free from injury will improve Outcome: Progressing    Problem: Skin Integrity: Goal: Risk for impaired skin integrity will decrease Outcome: Progressing   Problem: Education: Goal: Knowledge of the prescribed therapeutic regimen will improve Outcome: Progressing   Problem: Clinical Measurements: Goal: Usual level of consciousness will be regained or maintained. Outcome: Progressing Goal: Neurologic status will improve Outcome: Progressing Goal: Ability to maintain intracranial pressure will improve Outcome: Progressing   Problem: Skin Integrity: Goal: Demonstration of wound healing without infection will improve Outcome: Progressing

## 2024-07-11 NOTE — TOC Progression Note (Signed)
 Transition of Care [facility]) - Progression Note    Patient Details  Name: [name] [name] MRN: [id number] Date of Birth: 02-06-1981  Transition of [facility] CM/SW Contact  [name], [name] Phone Number: 07/11/2024, 4:19 PM  Clinical Narrative:   CSW notified by RN that patient was able to sit for HD session. CSW confirmed with nephrology team that patient is cleared for outpatient HD. CSW contacted [facility] to ask about private room availability, and also reached out to [facility] to ask about bed availability. Awaiting responses. CSW to follow.    Expected Discharge Plan: Skilled Nursing Facility Barriers to Discharge: Continued Medical Work up, English As A Second Language Teacher               Expected Discharge Plan and Services In-house Referral: Clinical Social Work   Post Acute Care Choice: Skilled Nursing Facility Living arrangements for the past 2 months: Apartment                                       Social Drivers of Health (SDOH) Interventions SDOH Screenings   Food Insecurity: Patient Unable To Answer (05/20/2024)  Housing: Patient Unable To Answer (05/20/2024)  Transportation Needs: Patient Unable To Answer (05/20/2024)  Utilities: Patient Unable To Answer (05/20/2024)  Social Connections: Unknown (10/04/2022)   Received from [facility]  Stress: No Stress Concern Present (04/16/2024)   Received from [facility]  Tobacco Use: Medium Risk (06/18/2024)    Readmission Risk Interventions    05/20/2024    4:16 PM 03/31/2024   11:27 AM 11/17/2021   12:15 PM  Readmission Risk Prevention Plan  Transportation Screening Complete Complete Complete  Medication Review Oceanographer) Complete Complete Complete  PCP or Specialist appointment within 3-5 days of discharge Complete  Complete  HRI or Home Care Consult Complete Complete Complete  SW Recovery Care/Counseling Consult Complete Complete Complete  Palliative Care Screening Complete Not  Applicable Not Applicable  Skilled Nursing Facility Complete Not Applicable Patient Refused

## 2024-07-11 NOTE — Progress Notes (Signed)
 Case discussed with nephrologist and CSW. Pt able to sit for HD today and nephrologist agreeable to proceed with snf placement and out-pt HD at d/c. Pt receives out-pt HD at [facility] on MWF 12:00 pm chair time. Will assist as needed.   Randine Mungo Dialysis Navigator [phone]

## 2024-07-11 NOTE — Progress Notes (Signed)
 Physical Therapy Treatment Patient Details Name: [name] [name] MRN: [id number] DOB: 05/15/1981 Today's Date: 07/11/2024   History of Present Illness 43 yo female presents 05/19/24 for left gaze and L side weakness. 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 Intubated 9/15-9/23. EVD 9/15- 10/15; s/p shunt 10/15. S/p laparoscopy with intra-abdominal assistance ventricular-peritoneal shunt placement 10/15. 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 Pt demonstrates great progress towards functional mobility goals this session with improved alertness and command following. Pt able to step pivot to EOB using RW with min A +2 and tolerate an additional standing trial with static standing marches after  a seated rest break with min A. Pt limited by quick fatigue, but demonstrates improved sitting balance at EOB requiring up to CGA with no lean or LOB. Pt continues to benefit from PT services to progress toward functional mobility goals.     If plan is discharge home, recommend the following: A lot of help with walking and/or transfers;A lot of help with bathing/dressing/bathroom;Assistance with cooking/housework;Direct supervision/assist for medications management;Direct supervision/assist for financial management;Assist for transportation;Help with stairs or ramp for entrance   Can travel by private vehicle     No  Equipment Recommendations  Hospital bed;Hoyer lift;Wheelchair (measurements PT);Wheelchair cushion (measurements PT);BSC/3in1    Recommendations for Other Services       Precautions / Restrictions Precautions Precautions: Fall Recall of  Precautions/Restrictions: Impaired Precaution/Restrictions Comments: PEG Restrictions Weight Bearing Restrictions Per Provider Order: No     Mobility  Bed Mobility Overal bed mobility: Needs Assistance Bed Mobility: Sit to Sidelying         Sit to sidelying: Min assist General bed mobility comments: Min A for BLE elevation to EOB and cues for sequencing    Transfers Overall transfer level: Needs assistance Equipment used: Rolling walker (2 wheels) Transfers: Sit to/from Stand, Bed to chair/wheelchair/BSC Sit to Stand: Min assist   Step pivot transfers: Min assist, +2 physical assistance, +2 safety/equipment       General transfer comment: STS from recliner and EOB with assist for power up. Assist for RW management and balance to pivot to EOB.    Ambulation/Gait             Pre-gait activities: Lexicographer     Tilt Bed    Modified Rankin (Stroke Patients Only) Modified Rankin (Stroke Patients Only) Pre-Morbid Rankin Score: Moderately severe disability Modified Rankin: Severe disability     Balance Overall balance assessment: Needs assistance Sitting-balance support: Bilateral upper extremity supported, Feet supported Sitting balance-Leahy Scale: Poor Sitting balance - Comments: CGA sitting EOB   Standing balance support: Reliant on assistive device for balance, Bilateral upper extremity supported, During functional activity Standing balance-Leahy Scale: Poor Standing balance comment: reliant on RW support                            Communication Communication Communication: Impaired Factors Affecting Communication: Difficulty expressing self;Reduced clarity of speech  Cognition Arousal: Alert Behavior During Therapy: Flat affect   PT - Cognitive impairments: Awareness, Memory, Attention, Initiation,  Sequencing, Safety/Judgement, Problem solving, Orientation                          Following commands: Impaired Following commands impaired: Follows one step commands with increased time    Cueing Cueing Techniques: Verbal cues, Gestural cues, Tactile cues, Visual cues  Exercises      General Comments        Pertinent Vitals/Pain Pain Assessment Pain Assessment: Faces Faces Pain Scale: Hurts a little bit Pain Location: bottom Pain Descriptors / Indicators: Discomfort, Grimacing, Guarding Pain Intervention(s): Limited activity within patient's tolerance, Monitored during session, Repositioned     PT Goals (current goals can now be found in the care plan section) Acute Rehab PT Goals Patient Stated Goal: unable to state goal PT Goal Formulation: Patient unable to participate in goal setting Time For Goal Achievement: 07/24/24 Progress towards PT goals: Progressing toward goals    Frequency    Min 2X/week          AM-PAC PT 6 Clicks Mobility   Outcome Measure  Help needed turning from your back to your side while in a flat bed without using bedrails?: A Little Help needed moving from lying on your back to sitting on the side of a flat bed without using bedrails?: A Little Help needed moving to and from a bed to a chair (including a wheelchair)?: A Lot Help needed standing up from a chair using your arms (e.g., wheelchair or bedside chair)?: A Little Help needed to walk in hospital room?: Total Help needed climbing 3-5 steps with a railing? : Total 6 Click Score: 13    End of Session Equipment Utilized During Treatment: Gait belt Activity Tolerance: Patient tolerated treatment well;Patient limited by fatigue Patient left: with call bell/phone within reach;with family/visitor present;in bed;with bed alarm set Nurse Communication: Mobility status PT Visit Diagnosis: Unsteadiness on feet (R26.81);Other abnormalities of gait and mobility (R26.89);Muscle weakness (generalized) (M62.81);Difficulty in walking, not elsewhere classified  (R26.2);Other symptoms and signs involving the nervous system (R29.898)     Time: 1400-1416 PT Time Calculation (min) (ACUTE ONLY): 16 min  Charges:    $Therapeutic Activity: 8-22 mins PT General Charges $$ ACUTE PT VISIT: 1 Visit                     Darryle George, PTA Acute Rehabilitation Services Secure Chat Preferred  [phone]    Darryle George 07/11/2024, 2:56 PM

## 2024-07-11 NOTE — Plan of Care (Signed)
  Problem: Education: Goal: Knowledge of disease or condition will improve Outcome: Progressing   Problem: Education: Goal: Knowledge of General Education information will improve Description: Including pain rating scale, medication(s)/side effects and non-pharmacologic comfort measures Outcome: Progressing   Problem: Skin Integrity: Goal: Risk for impaired skin integrity will decrease Outcome: Progressing

## 2024-07-12 DIAGNOSIS — I619 Nontraumatic intracerebral hemorrhage, unspecified: Secondary | ICD-10-CM | POA: Diagnosis not present

## 2024-07-12 LAB — HEMOGLOBIN AND HEMATOCRIT, BLOOD
HCT: 22.2 % — ABNORMAL LOW (ref 36.0–46.0)
Hemoglobin: 7.2 g/dL — ABNORMAL LOW (ref 12.0–15.0)

## 2024-07-12 MED ORDER — HYDRALAZINE HCL 20 MG/ML IJ SOLN
10.0000 mg | Freq: Four times a day (QID) | INTRAMUSCULAR | Status: DC | PRN
  Administered 2024-07-13 – 2024-08-24 (×17): 10 mg via INTRAVENOUS
  Filled 2024-07-12 (×18): qty 1

## 2024-07-12 MED ORDER — GERHARDT'S BUTT CREAM
TOPICAL_CREAM | Freq: Two times a day (BID) | CUTANEOUS | Status: DC
  Administered 2024-07-14 – 2024-07-21 (×3): 1 via TOPICAL
  Filled 2024-07-12 (×2): qty 60

## 2024-07-12 NOTE — Plan of Care (Signed)
  Problem: Education: Goal: Ability to describe self-care measures that may prevent or decrease complications (Diabetes Survival Skills Education) will improve Outcome: Progressing Goal: Individualized Educational Video(s) Outcome: Progressing   Problem: Coping: Goal: Ability to adjust to condition or change in health will improve Outcome: Progressing   Problem: Fluid Volume: Goal: Ability to maintain a balanced intake and output will improve Outcome: Progressing   Problem: Health Behavior/Discharge Planning: Goal: Ability to identify and utilize available resources and services will improve Outcome: Progressing Goal: Ability to manage health-related needs will improve Outcome: Progressing   Problem: Metabolic: Goal: Ability to maintain appropriate glucose levels will improve Outcome: Progressing   Problem: Nutritional: Goal: Maintenance of adequate nutrition will improve Outcome: Progressing Goal: Progress toward achieving an optimal weight will improve Outcome: Progressing   Problem: Skin Integrity: Goal: Risk for impaired skin integrity will decrease Outcome: Progressing   Problem: Tissue Perfusion: Goal: Adequacy of tissue perfusion will improve Outcome: Progressing   Problem: Education: Goal: Knowledge of disease or condition will improve Outcome: Progressing Goal: Knowledge of secondary prevention will improve (MUST DOCUMENT ALL) Outcome: Progressing Goal: Knowledge of patient specific risk factors will improve (DELETE if not current risk factor) Outcome: Progressing   Problem: Intracerebral Hemorrhage Tissue Perfusion: Goal: Complications of Intracerebral Hemorrhage will be minimized Outcome: Progressing   Problem: Coping: Goal: Will verbalize positive feelings about self Outcome: Progressing Goal: Will identify appropriate support needs Outcome: Progressing   Problem: Health Behavior/Discharge Planning: Goal: Ability to manage health-related needs will  improve Outcome: Progressing Goal: Goals will be collaboratively established with patient/family Outcome: Progressing   Problem: Self-Care: Goal: Ability to participate in self-care as condition permits will improve Outcome: Progressing Goal: Verbalization of feelings and concerns over difficulty with self-care will improve Outcome: Progressing Goal: Ability to communicate needs accurately will improve Outcome: Progressing   Problem: Nutrition: Goal: Risk of aspiration will decrease Outcome: Progressing Goal: Dietary intake will improve Outcome: Progressing   Problem: Education: Goal: Knowledge of General Education information will improve Description: Including pain rating scale, medication(s)/side effects and non-pharmacologic comfort measures Outcome: Progressing   Problem: Health Behavior/Discharge Planning: Goal: Ability to manage health-related needs will improve Outcome: Progressing   Problem: Clinical Measurements: Goal: Ability to maintain clinical measurements within normal limits will improve Outcome: Progressing Goal: Will remain free from infection Outcome: Progressing Goal: Diagnostic test results will improve Outcome: Progressing Goal: Respiratory complications will improve Outcome: Progressing Goal: Cardiovascular complication will be avoided Outcome: Progressing   Problem: Activity: Goal: Risk for activity intolerance will decrease Outcome: Progressing   Problem: Nutrition: Goal: Adequate nutrition will be maintained Outcome: Progressing   Problem: Coping: Goal: Level of anxiety will decrease Outcome: Progressing   Problem: Elimination: Goal: Will not experience complications related to bowel motility Outcome: Progressing   Problem: Pain Managment: Goal: General experience of comfort will improve and/or be controlled Outcome: Progressing   Problem: Safety: Goal: Ability to remain free from injury will improve Outcome: Progressing    Problem: Skin Integrity: Goal: Risk for impaired skin integrity will decrease Outcome: Progressing   Problem: Education: Goal: Knowledge of the prescribed therapeutic regimen will improve Outcome: Progressing   Problem: Clinical Measurements: Goal: Usual level of consciousness will be regained or maintained. Outcome: Progressing Goal: Neurologic status will improve Outcome: Progressing Goal: Ability to maintain intracranial pressure will improve Outcome: Progressing   Problem: Skin Integrity: Goal: Demonstration of wound healing without infection will improve Outcome: Progressing

## 2024-07-12 NOTE — Progress Notes (Signed)
 Overall stable.  No new issues or problems.  Remains afebrile.  Vital signs are stable.  Some superficial separation of her right occipital wound with some minimal serous drainage.  No evidence of erythema.  No evidence of CSF leakage.  Continue to monitor this.  Status post right occipital VP shunt.  Continue current efforts.  No new recommendations.

## 2024-07-12 NOTE — Progress Notes (Signed)
 PROGRESS NOTE    [name] [name]  FMW:[id number] DOB: Jan 05, 1981 DOA: 05/19/2024 PCP: [facility]   Brief Narrative:  This 43-yrs-old female with PMH significant for HTN, Ischemic and hemorrhagic CVA, type 1 diabetes, gastroparesis, kidney and pancreas transplant 2014, subsequent failure of renal transplant, now ESRD on dialysis, anxiety, narcotic abuse. Presented to the [facility] ED from dialysis as code stroke on 9/15; subsequently found to have hemorrhagic stroke. Neurology, Neurosurgery, PCCM consulted. EVD was placed on the left side. Patient admitted to neuro ICU. She was intubated on admission. She had prolonged ICU stay with EVD drain which was changed to VP shunt on 10/15.  Extubated on 9/23.  Transferred to [facility] service on 10/17.     She was recently discharged from here in August after she was managed for ischemic/hemorrhagic stroke and was discharged to LTAC.   Important events:  9/15 admitted with ICH plus IVH, EVD was placed for hydrocephalus, intubated 9/22 palliative care meeting.  Family is insistent that they want full scope of care 9/23 Extubated 10/1 -developed fevers pancultures drawn and broad-spectrum antibiotics started 10/2 became afebrile after starting antibiotics, did not tolerate raising EVD 10/6 EVD was raised from 10 to 30 cm water , tolerating well, remained afebrile 10/7 EVD is at 30 cm water , CT scan will be done tomorrow morning, no overnight issues 10/8 she became somnolent, CT head showed increasing hydrocephalus, EVD was titrated down to 10 cm of water  with improvement in mental status 10/15 VP shunt placement 10/17 transferred to progressive unit with [name]     Assessment & Plan:   Principal Problem:   Hemorrhagic stroke [facility]) Active Problems:   Moderate protein-calorie malnutrition   Pressure injury of skin   Nontraumatic subcortical hemorrhage of right cerebral hemisphere [facility])   Acute right parietotemporal intraparenchymal  hemorrhage / Intraventricular hemorrhage: Uncontrolled hypertension: Obstructive hydrocephalus s/p EVD changed to VP shunt: Seizure disorder S/p VP shunt placement on 06/18/2024 by Dr. Mavis.  Remains stable on Vimpat  100 mg twice daily.  Neurosurgery following. Per them, no issue after the shunt. Continue current recommendations.  No evidence of CSF leakage or evidence of erythema.   ESRD on dialysis: History of failed renal transplant: Secondary hyperparathyroidism: Nephrology following. Patient on CellCept  and prednisone .  Dialyzed on MWF schedule. Has AV fistula on the right upper extremity. Needs to be able to dialyze in chair prior to discharge. Next HD today. Have to keep a close eye on her in dialysis that she does not touch needles. Patient has completed hemodialysis in the recliner yesterday.  Tolerated well. TOC working on outpatient hemodialysis set up.  Acute bronchitis: Resolved.    Intermittent low-grade fever:  Thought to be secondary to intracranial hemorrhage. Previous blood cultures showed contamination.  S/p antibiotics for respiratory cultures positive for Serratia and Klebsiella.  Repeat cultures negative thus far. Patient afebrile for several days.   History of pancreatic transplantation:  Continue cyclosporine , mycophenolate , prednisone .   Hypertension:  Patient currently on Coreg , amlodipine  and clonidine  on nondialysis days.    Anemia of chronic disease:  No signs of active bleeding. Hemoglobin stable.   Type 1 diabetes with vasculopathy/gastroparesis:  Not on long-term insulin  anymore due to pancreatic transplant.  Monitor blood sugars. On sliding scale.   Moderate protein calorie malnutrition/dysphagia:  Has PEG tube. Continue tube feeds.  SLP and dietitian following.    Stage I sacral pressure ulcer present on admission: Continue wound care   Debility/deconditioning/disposition/goals of care:  Young patient with multiple comorbidities.  Palliative care consulted for goals of care conversations, family insist she remains full code.  Palliative has signed off 10/17.  Seen by PT OT.  They recommended CIR.  Reviewed by CIR, they recommend LTAC.  Peer to peer with insurance company was done on 06/25/2024, and they denied LTAC.  They recommended LTC.  Appeal for LTAC denied again.  TOC working on placing her to LTC.   Diarrhea: Continue Imodium  as needed.   DVT prophylaxis: SCDs Code Status: Full code Family Communication:Mother at bed side. Disposition Plan:    Status is: Inpatient Remains inpatient appropriate because:  Patient has completed hemodialysis in the recliner yesterday. TOC working on finding outpatient chair.  Consultants:  Nephrology  Procedures:  Antimicrobials:  Anti-infectives (From admission, onward)    Start     Dose/Rate Route Frequency Ordered Stop   07/08/24 1530  fluconazole  (DIFLUCAN ) tablet 150 mg        150 mg Oral  Once 07/08/24 1437 07/08/24 1544   06/18/24 2030  ceFAZolin  (ANCEF ) IVPB 2g/100 mL premix  Status:  Discontinued        2 g 200 mL/hr over 30 Minutes Intravenous Every 8 hours 06/18/24 1454 06/19/24 1608   06/18/24 0600  ceFAZolin  (ANCEF ) IVPB 2g/100 mL premix        2 g 200 mL/hr over 30 Minutes Intravenous On call to O.R. 06/15/24 1152 06/18/24 1230   06/06/24 1400  piperacillin -tazobactam (ZOSYN ) IVPB 2.25 g        2.25 g 100 mL/hr over 30 Minutes Intravenous Every 8 hours 06/06/24 1017 06/09/24 0538   06/06/24 1200  vancomycin  (VANCOREADY) IVPB 500 mg/100 mL        500 mg 100 mL/hr over 60 Minutes Intravenous Every M-W-F (Hemodialysis) 06/04/24 1745 06/06/24 1900   06/04/24 1845  vancomycin  (VANCOCIN ) IVPB 1000 mg/200 mL premix        1,000 mg 200 mL/hr over 60 Minutes Intravenous  Once 06/04/24 1745 06/04/24 1915   06/04/24 1830  ceFEPIme  (MAXIPIME ) 2 g in sodium chloride  0.9 % 100 mL IVPB  Status:  Discontinued        2 g 200 mL/hr over 30 Minutes Intravenous Every  M-W-F (Hemodialysis) 06/04/24 1743 06/06/24 1017   05/27/24 1015  cefTRIAXone  (ROCEPHIN ) 2 g in sodium chloride  0.9 % 100 mL IVPB        2 g 200 mL/hr over 30 Minutes Intravenous Every 24 hours 05/27/24 0918 05/29/24 1014   05/23/24 1530  ceFEPIme  (MAXIPIME ) 1 g in sodium chloride  0.9 % 100 mL IVPB  Status:  Discontinued        1 g 200 mL/hr over 30 Minutes Intravenous Every 24 hours 05/23/24 1433 05/27/24 0918   05/22/24 2200  Ampicillin -Sulbactam (UNASYN ) 3 g in sodium chloride  0.9 % 100 mL IVPB  Status:  Discontinued        3 g 200 mL/hr over 30 Minutes Intravenous Every 24 hours 05/22/24 0834 05/23/24 1433   05/22/24 0930  Ampicillin -Sulbactam (UNASYN ) 3 g in sodium chloride  0.9 % 100 mL IVPB        3 g 200 mL/hr over 30 Minutes Intravenous  Once 05/22/24 0834 05/22/24 1900      Subjective: Patient was seen and examined at bed side. Overnight events noted. Mother at bedside,  denies any concerns.  She has tolerated hemodialysis in recliner yesterday.  Objective: Vitals:   07/11/24 1247 07/11/24 1931 07/12/24 0359 07/12/24 0816  BP: (!) 161/53 (!) 187/68 (!) 148/56 (!) 158/66  Pulse:  77 77 75  Resp:  18 16 15   Temp:  97.8 F (36.6 C) 98 F (36.7 C) 98 F (36.7 C)  TempSrc:  Oral Oral Oral  SpO2:  100% 97% 100%  Weight:      Height:        Intake/Output Summary (Last 24 hours) at 07/12/2024 1129 Last data filed at 07/12/2024 0920 Gross per 24 hour  Intake 2152.25 ml  Output 500 ml  Net 1652.25 ml    Filed Weights   07/09/24 1400 07/11/24 0500  Weight: 54.8 kg 55 kg    Examination:  General exam: Appears calm and comfortable, not in any acute distress. Respiratory system: CTA Bilaterally. Respiratory effort normal. RR 14 Cardiovascular system: S1 & S2 heard, RRR. No JVD, murmurs, rubs, gallops or clicks.  Gastrointestinal system: Abdomen is nondistended, soft and nontender. Normal bowel sounds heard. Central nervous system: Alert and oriented x 3. No focal  neurological deficits. Extremities: No edema, No cyanosis, No clubbing Skin: No rashes, lesions or ulcers Psychiatry: Judgement and insight appear normal. Mood & affect appropriate.    Data Reviewed: I have personally reviewed following labs and imaging studies  CBC: Recent Labs  Lab 07/07/24 0220 07/08/24 0634 07/09/24 0125 07/11/24 0700  WBC 3.8* 4.9 3.8* 4.5  HGB 8.0* 8.2* 7.5* 7.0*  HCT 24.2* 25.4* 23.6* 21.1*  MCV 94.9 96.6 96.7 94.2  PLT 162 161 168 157   Basic Metabolic Panel: Recent Labs  Lab 07/07/24 0220 07/08/24 0634 07/09/24 0125 07/10/24 0811 07/11/24 0700  NA 128* 133* 129* 133* 129*  K 4.5 4.1 4.5 3.6 3.6  CL 84* 89* 84* 90* 89*  CO2 24 30 27 24 26   GLUCOSE 118* 108* 104* 90 133*  BUN 98* 44* 60* 23* 40*  CREATININE 5.24* 3.04* 4.02* 2.68* 4.13*  CALCIUM  10.2 10.0 10.2 10.1 9.9  MG 3.3* 2.7* 3.2*  --   --   PHOS 4.1 3.8 4.6  --  4.4   GFR: Estimated Creatinine Clearance: 14.7 mL/min (A) (by C-G formula based on SCr of 4.13 mg/dL (H)). Liver Function Tests: Recent Labs  Lab 07/11/24 0700  ALBUMIN  2.7*    No results for input(s): LIPASE, AMYLASE in the last 168 hours. No results for input(s): AMMONIA in the last 168 hours. Coagulation Profile: No results for input(s): INR, PROTIME in the last 168 hours. Cardiac Enzymes: No results for input(s): CKTOTAL, CKMB, CKMBINDEX, TROPONINI in the last 168 hours. BNP (last 3 results) No results for input(s): PROBNP in the last 8760 hours. HbA1C: No results for input(s): HGBA1C in the last 72 hours. CBG: Recent Labs  Lab 07/09/24 0739 07/09/24 1632 07/09/24 2035 07/09/24 2341 07/10/24 0339  GLUCAP 93 122* 86 86 88   Lipid Profile: No results for input(s): CHOL, HDL, LDLCALC, TRIG, CHOLHDL, LDLDIRECT in the last 72 hours. Thyroid Function Tests: No results for input(s): TSH, T4TOTAL, FREET4, T3FREE, THYROIDAB in the last 72 hours. Anemia Panel: No  results for input(s): VITAMINB12, FOLATE, FERRITIN, TIBC, IRON , RETICCTPCT in the last 72 hours. Sepsis Labs: No results for input(s): PROCALCITON, LATICACIDVEN in the last 168 hours.  No results found for this or any previous visit (from the past 240 hours).  Radiology Studies: No results found. Scheduled Meds:  carvedilol   25 mg Per Tube BID WC   Chlorhexidine  Gluconate Cloth  6 each Topical Q0600   cinacalcet   30 mg Oral Q M,W,F   cloNIDine   0.3 mg Per Tube Once per day  on Sunday Tuesday Thursday Saturday   cycloSPORINE   200 mg Per Tube QPM   cycloSPORINE   225 mg Per Tube Daily   darbepoetin (ARANESP ) injection - DIALYSIS  150 mcg Subcutaneous Q Thu-1800   famotidine   10 mg Per Tube QHS   feeding supplement (NEPRO CARB STEADY)  1,000 mL Per Tube Q24H   fiber  1 packet Per Tube TID   gabapentin   100 mg Per Tube Daily   lacosamide   100 mg Per Tube BID   liver oil-zinc  oxide   Topical TID   multivitamin with minerals  1 tablet Per Tube Daily   mycophenolate   500 mg Per Tube BID   mouth rinse  15 mL Mouth Rinse 4 times per day   oxidized cellulose  1 each Topical Once   predniSONE   15 mg Per Tube Q breakfast   Continuous Infusions:   LOS: 54 days    Time spent: 35 mins    Darcel Dawley, MD [name]   If 7PM-7AM, please contact night-coverage

## 2024-07-12 NOTE — Progress Notes (Signed)
 [facility] Progress Note   Subjective:   Tolerated HD in recliner yesterday.  No new events this am.    Objective Vitals:   07/11/24 1247 07/11/24 1931 07/12/24 0359 07/12/24 0816  BP: (!) 161/53 (!) 187/68 (!) 148/56 (!) 158/66  Pulse:  77 77 75  Resp:  18 16 15   Temp:  97.8 F (36.6 C) 98 F (36.7 C) 98 F (36.7 C)  TempSrc:  Oral Oral Oral  SpO2:  100% 97% 100%  Weight:      Height:       Physical Exam General: Chronically ill appearing woman, NAD. Room air Heart: RRR, no murmur Lungs: CTA anteriorly Abdomen: soft, PEG in place Extremities: no LE edema Dialysis Access: R AVF +t/b  Additional Objective Labs: Basic Metabolic Panel: Recent Labs  Lab 07/08/24 0634 07/09/24 0125 07/10/24 0811 07/11/24 0700  NA 133* 129* 133* 129*  K 4.1 4.5 3.6 3.6  CL 89* 84* 90* 89*  CO2 30 27 24 26   GLUCOSE 108* 104* 90 133*  BUN 44* 60* 23* 40*  CREATININE 3.04* 4.02* 2.68* 4.13*  CALCIUM  10.0 10.2 10.1 9.9  PHOS 3.8 4.6  --  4.4   CBC: Recent Labs  Lab 07/07/24 0220 07/08/24 0634 07/09/24 0125 07/11/24 0700  WBC 3.8* 4.9 3.8* 4.5  HGB 8.0* 8.2* 7.5* 7.0*  HCT 24.2* 25.4* 23.6* 21.1*  MCV 94.9 96.6 96.7 94.2  PLT 162 161 168 157   Medications:   carvedilol   25 mg Per Tube BID WC   Chlorhexidine  Gluconate Cloth  6 each Topical Q0600   cinacalcet   30 mg Oral Q M,W,F   cloNIDine   0.3 mg Per Tube Once per day on Sunday Tuesday Thursday Saturday   cycloSPORINE   200 mg Per Tube QPM   cycloSPORINE   225 mg Per Tube Daily   darbepoetin (ARANESP ) injection - DIALYSIS  150 mcg Subcutaneous Q Thu-1800   famotidine   10 mg Per Tube QHS   feeding supplement (NEPRO CARB STEADY)  1,000 mL Per Tube Q24H   fiber  1 packet Per Tube TID   gabapentin   100 mg Per Tube Daily   lacosamide   100 mg Per Tube BID   liver oil-zinc  oxide   Topical TID   multivitamin with minerals  1 tablet Per Tube Daily   mycophenolate   500 mg Per Tube BID   mouth rinse  15 mL  Mouth Rinse 4 times per day   oxidized cellulose  1 each Topical Once   predniSONE   15 mg Per Tube Q breakfast    Dialysis Orders MWF - NW 3:45hr, 400/A1.5, EDW 55.2kg, 2K/2Ca bath, AVF, no heparin  - Prev on Mircera 150mcg IV q 2 weeks - No VDRA   Assessment/Plan: Acute R intracranial hemorrhage: 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 Needs ongoing rehab. ESRD: Continue HD on MWF schedule. Next HD 11/10 HTN/volume: BP higher today, no LE edema, UF as tolerated. Only getting BP meds on non-HD days -> will try to simplify - coreg  to BID everyday. Anemia of ESRD: Hgb 7s- down again. Transfuse prn Hgb < 7.  Continue Aranesp  150mcg q Thursday - last dose 11/6 Secondary HPTH: CorrCa high, Phos ok. Continue sensipar , not on VDRA. Nutrition: Alb low, continue Nepro TF. Consider repeat swallowing study in future. 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 Hx T1D/gastroparesis Buttocks wound/?yeast: Using antifungal powder, improving. Dispo: Insurance declined LTAC, will be going to SNF. Tolerated HD in recliner on 11/7  [name] Ronnald Acosta PA-C [facility] 07/12/2024,9:08 AM

## 2024-07-13 DIAGNOSIS — I619 Nontraumatic intracerebral hemorrhage, unspecified: Secondary | ICD-10-CM | POA: Diagnosis not present

## 2024-07-13 MED ORDER — LABETALOL HCL 5 MG/ML IV SOLN
20.0000 mg | INTRAVENOUS | Status: DC | PRN
  Administered 2024-07-13 – 2024-08-23 (×4): 20 mg via INTRAVENOUS
  Filled 2024-07-13 (×5): qty 4

## 2024-07-13 NOTE — Plan of Care (Signed)
  Problem: Education: Goal: Ability to describe self-care measures that may prevent or decrease complications (Diabetes Survival Skills Education) will improve Outcome: Progressing Goal: Individualized Educational Video(s) Outcome: Progressing   Problem: Coping: Goal: Ability to adjust to condition or change in health will improve Outcome: Progressing   Problem: Fluid Volume: Goal: Ability to maintain a balanced intake and output will improve Outcome: Progressing   Problem: Health Behavior/Discharge Planning: Goal: Ability to identify and utilize available resources and services will improve Outcome: Progressing Goal: Ability to manage health-related needs will improve Outcome: Progressing   Problem: Metabolic: Goal: Ability to maintain appropriate glucose levels will improve Outcome: Progressing   Problem: Nutritional: Goal: Maintenance of adequate nutrition will improve Outcome: Progressing Goal: Progress toward achieving an optimal weight will improve Outcome: Progressing   Problem: Skin Integrity: Goal: Risk for impaired skin integrity will decrease Outcome: Progressing   Problem: Tissue Perfusion: Goal: Adequacy of tissue perfusion will improve Outcome: Progressing   Problem: Education: Goal: Knowledge of disease or condition will improve Outcome: Progressing Goal: Knowledge of secondary prevention will improve (MUST DOCUMENT ALL) Outcome: Progressing Goal: Knowledge of patient specific risk factors will improve (DELETE if not current risk factor) Outcome: Progressing   Problem: Intracerebral Hemorrhage Tissue Perfusion: Goal: Complications of Intracerebral Hemorrhage will be minimized Outcome: Progressing   Problem: Coping: Goal: Will verbalize positive feelings about self Outcome: Progressing Goal: Will identify appropriate support needs Outcome: Progressing   Problem: Health Behavior/Discharge Planning: Goal: Ability to manage health-related needs will  improve Outcome: Progressing Goal: Goals will be collaboratively established with patient/family Outcome: Progressing   Problem: Self-Care: Goal: Ability to participate in self-care as condition permits will improve Outcome: Progressing Goal: Verbalization of feelings and concerns over difficulty with self-care will improve Outcome: Progressing Goal: Ability to communicate needs accurately will improve Outcome: Progressing   Problem: Nutrition: Goal: Risk of aspiration will decrease Outcome: Progressing Goal: Dietary intake will improve Outcome: Progressing   Problem: Education: Goal: Knowledge of General Education information will improve Description: Including pain rating scale, medication(s)/side effects and non-pharmacologic comfort measures Outcome: Progressing   Problem: Health Behavior/Discharge Planning: Goal: Ability to manage health-related needs will improve Outcome: Progressing   Problem: Clinical Measurements: Goal: Ability to maintain clinical measurements within normal limits will improve Outcome: Progressing Goal: Will remain free from infection Outcome: Progressing Goal: Diagnostic test results will improve Outcome: Progressing Goal: Respiratory complications will improve Outcome: Progressing Goal: Cardiovascular complication will be avoided Outcome: Progressing   Problem: Activity: Goal: Risk for activity intolerance will decrease Outcome: Progressing   Problem: Nutrition: Goal: Adequate nutrition will be maintained Outcome: Progressing   Problem: Coping: Goal: Level of anxiety will decrease Outcome: Progressing   Problem: Elimination: Goal: Will not experience complications related to bowel motility Outcome: Progressing   Problem: Pain Managment: Goal: General experience of comfort will improve and/or be controlled Outcome: Progressing   Problem: Safety: Goal: Ability to remain free from injury will improve Outcome: Progressing    Problem: Skin Integrity: Goal: Risk for impaired skin integrity will decrease Outcome: Progressing   Problem: Education: Goal: Knowledge of the prescribed therapeutic regimen will improve Outcome: Progressing   Problem: Clinical Measurements: Goal: Usual level of consciousness will be regained or maintained. Outcome: Progressing Goal: Neurologic status will improve Outcome: Progressing Goal: Ability to maintain intracranial pressure will improve Outcome: Progressing   Problem: Skin Integrity: Goal: Demonstration of wound healing without infection will improve Outcome: Progressing

## 2024-07-13 NOTE — Progress Notes (Signed)
 PROGRESS NOTE    [name] [name]  FMW:[id number] DOB: 03-17-1981 DOA: 05/19/2024 PCP: [facility]   Brief Narrative:  This 43-yrs-old female with PMH significant for HTN, Ischemic and hemorrhagic CVA, type 1 diabetes, gastroparesis, kidney and pancreas transplant 2014, subsequent failure of renal transplant, now ESRD on dialysis, anxiety, narcotic abuse. Presented to the [facility] ED from dialysis as code stroke on 9/15; subsequently found to have hemorrhagic stroke. Neurology, Neurosurgery, PCCM consulted. EVD was placed on the left side. Patient admitted to neuro ICU. She was intubated on admission. She had prolonged ICU stay with EVD drain which was changed to VP shunt on 10/15.  Extubated on 9/23.  Transferred to [facility] service on 10/17.     She was recently discharged from here in August after she was managed for ischemic/hemorrhagic stroke and was discharged to LTAC.   Important events:  9/15 admitted with ICH plus IVH, EVD was placed for hydrocephalus, intubated 9/22 palliative care meeting.  Family is insistent that they want full scope of care 9/23 Extubated 10/1 -developed fevers pancultures drawn and broad-spectrum antibiotics started 10/2 became afebrile after starting antibiotics, did not tolerate raising EVD 10/6 EVD was raised from 10 to 30 cm water , tolerating well, remained afebrile 10/7 EVD is at 30 cm water , CT scan will be done tomorrow morning, no overnight issues 10/8 she became somnolent, CT head showed increasing hydrocephalus, EVD was titrated down to 10 cm of water  with improvement in mental status 10/15 VP shunt placement 10/17 transferred to progressive unit with [name]     Assessment & Plan:   Principal Problem:   Hemorrhagic stroke [facility]) Active Problems:   Moderate protein-calorie malnutrition   Pressure injury of skin   Nontraumatic subcortical hemorrhage of right cerebral hemisphere [facility])   Acute right parietotemporal intraparenchymal  hemorrhage / Intraventricular hemorrhage: Uncontrolled hypertension: Obstructive hydrocephalus s/p EVD changed to VP shunt: Seizure disorder S/p VP shunt placement on 06/18/2024 by Dr. Mavis.  Remains stable on Vimpat  100 mg twice daily.  Neurosurgery following. Per them, no issue after the shunt. Continue current recommendations.  No evidence of CSF leakage or evidence of erythema.   ESRD on dialysis: History of failed renal transplant: Secondary hyperparathyroidism: Nephrology following. Patient on CellCept  and prednisone .  Dialyzed on MWF schedule. Has AV fistula on the right upper extremity. Needs to be able to dialyze in chair prior to discharge. Patient has completed hemodialysis in the recliner yesterday.  Tolerated well. TOC working on outpatient hemodialysis set up. Next HD tomorrow   Acute bronchitis: Resolved.    Intermittent low-grade fever:  Thought to be secondary to intracranial hemorrhage. Previous blood cultures showed contamination.  S/p antibiotics for respiratory cultures positive for Serratia and Klebsiella.  Repeat cultures negative thus far. Patient afebrile for several days.   History of pancreatic transplantation:  Continue cyclosporine , mycophenolate , prednisone .   Hypertension:  Patient currently on Coreg , amlodipine  and clonidine  on nondialysis days.    Anemia of chronic disease:  No signs of active bleeding. Hemoglobin stable.   Type 1 diabetes with vasculopathy/gastroparesis:  Not on long-term insulin  anymore due to pancreatic transplant.  Monitor blood sugars. On sliding scale.   Moderate protein calorie malnutrition/dysphagia:  Has PEG tube. Continue tube feeds.  SLP and dietitian following.    Stage I sacral pressure ulcer present on admission: Continue wound care   Debility/deconditioning/disposition/goals of care:  Young patient with multiple comorbidities.  Palliative care consulted for goals of care conversations, family insist she  remains full  code.  Palliative has signed off 10/17.  Seen by PT OT.  They recommended CIR.  Reviewed by CIR, they recommend LTAC.  Peer to peer with insurance company was done on 06/25/2024, and they denied LTAC.  They recommended LTC.  Appeal for LTAC denied again.  TOC working on placing her to LTC.   Diarrhea: Continue Imodium  as needed.   DVT prophylaxis: SCDs Code Status: Full code Family Communication:Mother at bed side. Disposition Plan:    Status is: Inpatient Remains inpatient appropriate because:  Patient has completed hemodialysis in the recliner yesterday. TOC working on finding outpatient chair.  Consultants:  Nephrology  Procedures:  Antimicrobials:  Anti-infectives (From admission, onward)    Start     Dose/Rate Route Frequency Ordered Stop   07/08/24 1530  fluconazole  (DIFLUCAN ) tablet 150 mg        150 mg Oral  Once 07/08/24 1437 07/08/24 1544   06/18/24 2030  ceFAZolin  (ANCEF ) IVPB 2g/100 mL premix  Status:  Discontinued        2 g 200 mL/hr over 30 Minutes Intravenous Every 8 hours 06/18/24 1454 06/19/24 1608   06/18/24 0600  ceFAZolin  (ANCEF ) IVPB 2g/100 mL premix        2 g 200 mL/hr over 30 Minutes Intravenous On call to O.R. 06/15/24 1152 06/18/24 1230   06/06/24 1400  piperacillin -tazobactam (ZOSYN ) IVPB 2.25 g        2.25 g 100 mL/hr over 30 Minutes Intravenous Every 8 hours 06/06/24 1017 06/09/24 0538   06/06/24 1200  vancomycin  (VANCOREADY) IVPB 500 mg/100 mL        500 mg 100 mL/hr over 60 Minutes Intravenous Every M-W-F (Hemodialysis) 06/04/24 1745 06/06/24 1900   06/04/24 1845  vancomycin  (VANCOCIN ) IVPB 1000 mg/200 mL premix        1,000 mg 200 mL/hr over 60 Minutes Intravenous  Once 06/04/24 1745 06/04/24 1915   06/04/24 1830  ceFEPIme  (MAXIPIME ) 2 g in sodium chloride  0.9 % 100 mL IVPB  Status:  Discontinued        2 g 200 mL/hr over 30 Minutes Intravenous Every M-W-F (Hemodialysis) 06/04/24 1743 06/06/24 1017   05/27/24 1015  cefTRIAXone   (ROCEPHIN ) 2 g in sodium chloride  0.9 % 100 mL IVPB        2 g 200 mL/hr over 30 Minutes Intravenous Every 24 hours 05/27/24 0918 05/29/24 1014   05/23/24 1530  ceFEPIme  (MAXIPIME ) 1 g in sodium chloride  0.9 % 100 mL IVPB  Status:  Discontinued        1 g 200 mL/hr over 30 Minutes Intravenous Every 24 hours 05/23/24 1433 05/27/24 0918   05/22/24 2200  Ampicillin -Sulbactam (UNASYN ) 3 g in sodium chloride  0.9 % 100 mL IVPB  Status:  Discontinued        3 g 200 mL/hr over 30 Minutes Intravenous Every 24 hours 05/22/24 0834 05/23/24 1433   05/22/24 0930  Ampicillin -Sulbactam (UNASYN ) 3 g in sodium chloride  0.9 % 100 mL IVPB        3 g 200 mL/hr over 30 Minutes Intravenous  Once 05/22/24 0834 05/22/24 1900      Subjective: Patient was seen and examined at bed side. Overnight events noted. She has tolerated hemodialysis in recliner yesterday.Mother very excited for her disposition soon.  Objective: Vitals:   07/13/24 0349 07/13/24 0500 07/13/24 0828 07/13/24 1108  BP: (!) 155/67  (!) 153/65 (!) 152/62  Pulse: 80  84 85  Resp: 18  16 16   Temp: 98.7 F (  37.1 C)  99.1 F (37.3 C) 99.8 F (37.7 C)  TempSrc: Oral  Oral Oral  SpO2: 98%  94% 98%  Weight:  55.2 kg    Height:        Intake/Output Summary (Last 24 hours) at 07/13/2024 1153 Last data filed at 07/13/2024 0800 Gross per 24 hour  Intake 1200 ml  Output 0 ml  Net 1200 ml    Filed Weights   07/11/24 0500 07/13/24 0500  Weight: 55 kg 55.2 kg    Examination:  General exam: Appears calm and comfortable, not in any acute distress. Respiratory system: CTA Bilaterally. Respiratory effort normal. RR 15 Cardiovascular system: S1 & S2 heard, RRR. No JVD, murmurs, rubs, gallops or clicks.  Gastrointestinal system: Abdomen is nondistended, soft and nontender. Normal bowel sounds heard. Central nervous system: Alert and oriented x 3. No focal neurological deficits. Extremities: No edema, No cyanosis, No clubbing Skin: No rashes,  lesions or ulcers Psychiatry: Judgement and insight appear normal. Mood & affect appropriate.    Data Reviewed: I have personally reviewed following labs and imaging studies  CBC: Recent Labs  Lab 07/07/24 0220 07/08/24 0634 07/09/24 0125 07/11/24 0700 07/12/24 1327  WBC 3.8* 4.9 3.8* 4.5  --   HGB 8.0* 8.2* 7.5* 7.0* 7.2*  HCT 24.2* 25.4* 23.6* 21.1* 22.2*  MCV 94.9 96.6 96.7 94.2  --   PLT 162 161 168 157  --    Basic Metabolic Panel: Recent Labs  Lab 07/07/24 0220 07/08/24 0634 07/09/24 0125 07/10/24 0811 07/11/24 0700  NA 128* 133* 129* 133* 129*  K 4.5 4.1 4.5 3.6 3.6  CL 84* 89* 84* 90* 89*  CO2 24 30 27 24 26   GLUCOSE 118* 108* 104* 90 133*  BUN 98* 44* 60* 23* 40*  CREATININE 5.24* 3.04* 4.02* 2.68* 4.13*  CALCIUM  10.2 10.0 10.2 10.1 9.9  MG 3.3* 2.7* 3.2*  --   --   PHOS 4.1 3.8 4.6  --  4.4   GFR: Estimated Creatinine Clearance: 14.7 mL/min (A) (by C-G formula based on SCr of 4.13 mg/dL (H)). Liver Function Tests: Recent Labs  Lab 07/11/24 0700  ALBUMIN  2.7*    No results for input(s): LIPASE, AMYLASE in the last 168 hours. No results for input(s): AMMONIA in the last 168 hours. Coagulation Profile: No results for input(s): INR, PROTIME in the last 168 hours. Cardiac Enzymes: No results for input(s): CKTOTAL, CKMB, CKMBINDEX, TROPONINI in the last 168 hours. BNP (last 3 results) No results for input(s): PROBNP in the last 8760 hours. HbA1C: No results for input(s): HGBA1C in the last 72 hours. CBG: Recent Labs  Lab 07/09/24 0739 07/09/24 1632 07/09/24 2035 07/09/24 2341 07/10/24 0339  GLUCAP 93 122* 86 86 88   Lipid Profile: No results for input(s): CHOL, HDL, LDLCALC, TRIG, CHOLHDL, LDLDIRECT in the last 72 hours. Thyroid Function Tests: No results for input(s): TSH, T4TOTAL, FREET4, T3FREE, THYROIDAB in the last 72 hours. Anemia Panel: No results for input(s): VITAMINB12, FOLATE,  FERRITIN, TIBC, IRON , RETICCTPCT in the last 72 hours. Sepsis Labs: No results for input(s): PROCALCITON, LATICACIDVEN in the last 168 hours.  No results found for this or any previous visit (from the past 240 hours).  Radiology Studies: No results found. Scheduled Meds:  carvedilol   25 mg Per Tube BID WC   Chlorhexidine  Gluconate Cloth  6 each Topical Q0600   cinacalcet   30 mg Oral Q M,W,F   cloNIDine   0.3 mg Per Tube Once per day on  Sunday Tuesday Thursday Saturday   cycloSPORINE   200 mg Per Tube QPM   cycloSPORINE   225 mg Per Tube Daily   darbepoetin (ARANESP ) injection - DIALYSIS  150 mcg Subcutaneous Q Thu-1800   famotidine   10 mg Per Tube QHS   feeding supplement (NEPRO CARB STEADY)  1,000 mL Per Tube Q24H   fiber  1 packet Per Tube TID   gabapentin   100 mg Per Tube Daily   Gerhardt's butt cream   Topical BID   lacosamide   100 mg Per Tube BID   liver oil-zinc  oxide   Topical TID   multivitamin with minerals  1 tablet Per Tube Daily   mycophenolate   500 mg Per Tube BID   mouth rinse  15 mL Mouth Rinse 4 times per day   oxidized cellulose  1 each Topical Once   predniSONE   15 mg Per Tube Q breakfast   Continuous Infusions:   LOS: 55 days    Time spent: 35 mins    Darcel Dawley, MD [name]   If 7PM-7AM, please contact night-coverage

## 2024-07-13 NOTE — Plan of Care (Signed)
 Had an increase BP, informed MD, prn meds were given. Problem: Health Behavior/Discharge Planning: Goal: Ability to manage health-related needs will improve Outcome: Progressing   Problem: Nutritional: Goal: Maintenance of adequate nutrition will improve Outcome: Progressing   Problem: Nutritional: Goal: Progress toward achieving an optimal weight will improve Outcome: Progressing   Problem: Skin Integrity: Goal: Risk for impaired skin integrity will decrease Outcome: Progressing   Problem: Intracerebral Hemorrhage Tissue Perfusion: Goal: Complications of Intracerebral Hemorrhage will be minimized Outcome: Progressing   Problem: Self-Care: Goal: Ability to participate in self-care as condition permits will improve Outcome: Progressing   Problem: Clinical Measurements: Goal: Will remain free from infection Outcome: Progressing   Problem: Activity: Goal: Risk for activity intolerance will decrease Outcome: Progressing   Problem: Pain Managment: Goal: General experience of comfort will improve and/or be controlled Outcome: Progressing   Problem: Safety: Goal: Ability to remain free from injury will improve Outcome: Progressing   Problem: Skin Integrity: Goal: Risk for impaired skin integrity will decrease Outcome: Progressing

## 2024-07-13 NOTE — Progress Notes (Signed)
 [facility] Progress Note   Subjective:   Seen in room. No new events overnight.    Objective Vitals:   07/13/24 0035 07/13/24 0349 07/13/24 0500 07/13/24 0828  BP: (!) 153/60 (!) 155/67  (!) 153/65  Pulse: 76 80  84  Resp: 18 18  16   Temp: 98.1 F (36.7 C) 98.7 F (37.1 C)  99.1 F (37.3 C)  TempSrc: Oral Oral  Oral  SpO2: 100% 98%  94%  Weight:   55.2 kg   Height:       Physical Exam General: Chronically ill appearing woman, NAD. Room air Heart: RRR, no murmur Lungs: CTA anteriorly Abdomen: soft, PEG in place Extremities: no LE edema Dialysis Access: R AVF +t/b  Additional Objective Labs: Basic Metabolic Panel: Recent Labs  Lab 07/08/24 0634 07/09/24 0125 07/10/24 0811 07/11/24 0700  NA 133* 129* 133* 129*  K 4.1 4.5 3.6 3.6  CL 89* 84* 90* 89*  CO2 30 27 24 26   GLUCOSE 108* 104* 90 133*  BUN 44* 60* 23* 40*  CREATININE 3.04* 4.02* 2.68* 4.13*  CALCIUM  10.0 10.2 10.1 9.9  PHOS 3.8 4.6  --  4.4   CBC: Recent Labs  Lab 07/07/24 0220 07/08/24 0634 07/09/24 0125 07/11/24 0700 07/12/24 1327  WBC 3.8* 4.9 3.8* 4.5  --   HGB 8.0* 8.2* 7.5* 7.0* 7.2*  HCT 24.2* 25.4* 23.6* 21.1* 22.2*  MCV 94.9 96.6 96.7 94.2  --   PLT 162 161 168 157  --    Medications:   carvedilol   25 mg Per Tube BID WC   Chlorhexidine  Gluconate Cloth  6 each Topical Q0600   cinacalcet   30 mg Oral Q M,W,F   cloNIDine   0.3 mg Per Tube Once per day on Sunday Tuesday Thursday Saturday   cycloSPORINE   200 mg Per Tube QPM   cycloSPORINE   225 mg Per Tube Daily   darbepoetin (ARANESP ) injection - DIALYSIS  150 mcg Subcutaneous Q Thu-1800   famotidine   10 mg Per Tube QHS   feeding supplement (NEPRO CARB STEADY)  1,000 mL Per Tube Q24H   fiber  1 packet Per Tube TID   gabapentin   100 mg Per Tube Daily   Gerhardt's butt cream   Topical BID   lacosamide   100 mg Per Tube BID   liver oil-zinc  oxide   Topical TID   multivitamin with minerals  1 tablet Per Tube Daily    mycophenolate   500 mg Per Tube BID   mouth rinse  15 mL Mouth Rinse 4 times per day   oxidized cellulose  1 each Topical Once   predniSONE   15 mg Per Tube Q breakfast    Dialysis Orders MWF - NW 3:45hr, 400/A1.5, EDW 55.2kg, 2K/2Ca bath, AVF, no heparin  - Prev on Mircera 150mcg IV q 2 weeks - No VDRA   Assessment/Plan: Acute R intracranial hemorrhage: 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 Needs ongoing rehab. ESRD: Continue HD on MWF schedule. Next HD 11/10 HTN/volume: BP higher today, no LE edema, UF as tolerated. Only getting BP meds on non-HD days -> will try to simplify - coreg  to BID everyday. Anemia of ESRD: Hgb 7s- down again. Transfuse prn Hgb < 7.  Continue Aranesp  150mcg q Thursday - last dose 11/6 Secondary HPTH: CorrCa high, Phos ok. Continue sensipar , not on VDRA. Nutrition: Alb low, continue Nepro TF. Consider repeat swallowing study in future. 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 Hx T1D/gastroparesis Buttocks wound/?yeast: Using antifungal powder,  improving. Dispo: Insurance declined LTAC, will be going to SNF. Tolerated HD in recliner on 11/7    Maisie Ronnald Acosta PA-C [facility] 07/13/2024,9:12 AM

## 2024-07-14 ENCOUNTER — Other Ambulatory Visit: Payer: Self-pay

## 2024-07-14 DIAGNOSIS — I619 Nontraumatic intracerebral hemorrhage, unspecified: Secondary | ICD-10-CM | POA: Diagnosis not present

## 2024-07-14 LAB — CBC
HCT: 21.2 % — ABNORMAL LOW (ref 36.0–46.0)
Hemoglobin: 7 g/dL — ABNORMAL LOW (ref 12.0–15.0)
MCH: 31.7 pg (ref 26.0–34.0)
MCHC: 33 g/dL (ref 30.0–36.0)
MCV: 95.9 fL (ref 80.0–100.0)
Platelets: 160 K/uL (ref 150–400)
RBC: 2.21 MIL/uL — ABNORMAL LOW (ref 3.87–5.11)
RDW: 14.8 % (ref 11.5–15.5)
WBC: 5.9 K/uL (ref 4.0–10.5)
nRBC: 0 % (ref 0.0–0.2)

## 2024-07-14 LAB — GLUCOSE, CAPILLARY
Glucose-Capillary: 128 mg/dL — ABNORMAL HIGH (ref 70–99)
Glucose-Capillary: 169 mg/dL — ABNORMAL HIGH (ref 70–99)

## 2024-07-14 LAB — BASIC METABOLIC PANEL WITH GFR
Anion gap: 13 (ref 5–15)
BUN: 59 mg/dL — ABNORMAL HIGH (ref 6–20)
CO2: 26 mmol/L (ref 22–32)
Calcium: 10.1 mg/dL (ref 8.9–10.3)
Chloride: 89 mmol/L — ABNORMAL LOW (ref 98–111)
Creatinine, Ser: 4.72 mg/dL — ABNORMAL HIGH (ref 0.44–1.00)
GFR, Estimated: 11 mL/min — ABNORMAL LOW (ref >60)
Glucose, Bld: 108 mg/dL — ABNORMAL HIGH (ref 70–99)
Potassium: 3.9 mmol/L (ref 3.5–5.1)
Sodium: 128 mmol/L — ABNORMAL LOW (ref 135–145)

## 2024-07-14 LAB — PHOSPHORUS: Phosphorus: 2.4 mg/dL — ABNORMAL LOW (ref 2.5–4.6)

## 2024-07-14 LAB — MAGNESIUM: Magnesium: 3 mg/dL — ABNORMAL HIGH (ref 1.7–2.4)

## 2024-07-14 MED ORDER — HYDROMORPHONE HCL 1 MG/ML IJ SOLN
0.5000 mg | INTRAMUSCULAR | Status: DC | PRN
  Administered 2024-07-14: 0.5 mg via INTRAVENOUS

## 2024-07-14 MED ORDER — LIDOCAINE-PRILOCAINE 2.5-2.5 % EX CREA: 1.0000 | TOPICAL_CREAM | CUTANEOUS | Status: DC | PRN

## 2024-07-14 MED ORDER — HYDROMORPHONE HCL 1 MG/ML IJ SOLN
INTRAMUSCULAR | Status: AC
  Filled 2024-07-14: qty 0.5

## 2024-07-14 MED ORDER — LIDOCAINE HCL (PF) 1 % IJ SOLN: 5.0000 mL | INTRAMUSCULAR | Status: DC | PRN

## 2024-07-14 MED ORDER — ALTEPLASE 2 MG IJ SOLR: 2.0000 mg | Freq: Once | INTRAMUSCULAR | Status: DC | PRN

## 2024-07-14 MED ORDER — HEPARIN SODIUM (PORCINE) 1000 UNIT/ML DIALYSIS: 1000.0000 [IU] | INTRAMUSCULAR | Status: DC | PRN

## 2024-07-14 MED ORDER — PENTAFLUOROPROP-TETRAFLUOROETH EX AERO
1.0000 | INHALATION_SPRAY | CUTANEOUS | Status: DC | PRN
Start: 2024-07-14 — End: 2024-07-14

## 2024-07-14 MED ORDER — ANTICOAGULANT SODIUM CITRATE 4% (200MG/5ML) IV SOLN
5.0000 mL | Status: DC | PRN
  Filled 2024-07-14: qty 5

## 2024-07-14 MED ORDER — GUAIFENESIN-DM 100-10 MG/5ML PO SYRP
5.0000 mL | ORAL_SOLUTION | ORAL | Status: DC | PRN
  Administered 2024-07-14 – 2024-08-04 (×21): 5 mL via ORAL
  Filled 2024-07-14 (×22): qty 5

## 2024-07-14 NOTE — Plan of Care (Signed)
  Problem: Health Behavior/Discharge Planning: Goal: Ability to manage health-related needs will improve Outcome: Progressing   Problem: Metabolic: Goal: Ability to maintain appropriate glucose levels will improve Outcome: Progressing   Problem: Skin Integrity: Goal: Risk for impaired skin integrity will decrease Outcome: Progressing   Problem: Intracerebral Hemorrhage Tissue Perfusion: Goal: Complications of Intracerebral Hemorrhage will be minimized Outcome: Progressing   Problem: Coping: Goal: Will verbalize positive feelings about self Outcome: Progressing   Problem: Self-Care: Goal: Ability to participate in self-care as condition permits will improve Outcome: Progressing   Problem: Nutrition: Goal: Risk of aspiration will decrease Outcome: Progressing   Problem: Activity: Goal: Risk for activity intolerance will decrease Outcome: Progressing   Problem: Safety: Goal: Ability to remain free from injury will improve Outcome: Progressing   Problem: Skin Integrity: Goal: Risk for impaired skin integrity will decrease Outcome: Progressing

## 2024-07-14 NOTE — Progress Notes (Signed)
 Physical Therapy Treatment Patient Details Name: [name] [name] MRN: [id number] DOB: 01-20-1981 Today's Date: 07/14/2024   History of Present Illness 43 yo female presents 05/19/24 for left gaze and L side weakness. 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 Intubated 9/15-9/23. EVD 9/15- 10/15; s/p shunt 10/15. S/p laparoscopy with intra-abdominal assistance ventricular-peritoneal shunt placement 10/15. PMH: HTN, ESRD on HD s/p renal and pancreatic transplants, DM, gastroparesis, multiple strokes (ischemic and hemorrhagic) with prepontine SAH, hydrocephalus requiring EVD placement    PT Comments  Pt received in the recliner and agreeable to session. Pt reports bottom pain, but is slightly improved. Pt able to stand and pivot to EOB with min A, but sits quickly to EOB due to fatigue. Pt able to tolerate additional stand and lateral steps to EOB, but requests to return to supine due to fatigue. Pt agreeable to supine BLE exercise at end of session and is encouraged to perform independently for strengthening. Pt continues to benefit from PT services to progress toward functional mobility goals.     If plan is discharge home, recommend the following: A lot of help with walking and/or transfers;A lot of help with bathing/dressing/bathroom;Assistance with cooking/housework;Direct supervision/assist for medications management;Direct supervision/assist for financial management;Assist for transportation;Help with stairs or ramp for entrance   Can travel by private vehicle     No  Equipment Recommendations  Hospital bed;Hoyer lift;Wheelchair (measurements PT);Wheelchair cushion (measurements PT);BSC/3in1    Recommendations for Other Services       Precautions / Restrictions Precautions Precautions: Fall Recall of Precautions/Restrictions: Impaired Precaution/Restrictions  Comments: PEG Restrictions Weight Bearing Restrictions Per Provider Order: No     Mobility  Bed Mobility Overal bed mobility: Needs Assistance Bed Mobility: Sit to Supine       Sit to supine: Contact guard assist        Transfers Overall transfer level: Needs assistance Equipment used: Rolling walker (2 wheels) Transfers: Sit to/from Stand, Bed to chair/wheelchair/BSC Sit to Stand: Min assist, +2 safety/equipment   Step pivot transfers: Min assist, +2 safety/equipment       General transfer comment: STS from recliner and EOB with min A for power up and stability. pivot to EOB and lateral steps to EOB with min A for stability and RW management. Pt sitting early to EOB due to fatigue despite cues for safety    Ambulation/Gait               General Gait Details: unable due to fatigue   Stairs             Wheelchair Mobility     Tilt Bed    Modified Rankin (Stroke Patients Only) Modified Rankin (Stroke Patients Only) Pre-Morbid Rankin Score: Moderately severe disability Modified Rankin: Severe disability     Balance Overall balance assessment: Needs assistance Sitting-balance support: Bilateral upper extremity supported, Feet supported Sitting balance-Leahy Scale: Fair Sitting balance - Comments: CGA sitting EOB   Standing balance support: Reliant on assistive device for balance, Bilateral upper extremity supported, During functional activity Standing balance-Leahy Scale: Poor Standing balance comment: reliant on RW and external support                            Communication Communication Communication: Impaired Factors Affecting Communication: Difficulty expressing self;Reduced clarity of speech  Cognition Arousal: Alert Behavior During Therapy: Flat affect   PT -  Cognitive impairments: Awareness, Memory, Attention, Initiation, Sequencing, Safety/Judgement, Problem solving, Orientation                          Following commands: Impaired Following commands impaired: Follows one step commands with increased time    Cueing Cueing Techniques: Verbal cues, Gestural cues, Tactile cues, Visual cues  Exercises General Exercises - Lower Extremity Straight Leg Raises: AROM, Supine, Both, 5 reps    General Comments        Pertinent Vitals/Pain Pain Assessment Pain Assessment: Faces Faces Pain Scale: Hurts little more Pain Location: bottom Pain Descriptors / Indicators: Discomfort, Grimacing, Guarding Pain Intervention(s): Monitored during session, Repositioned     PT Goals (current goals can now be found in the care plan section) Acute Rehab PT Goals Patient Stated Goal: unable to state goal PT Goal Formulation: Patient unable to participate in goal setting Time For Goal Achievement: 07/24/24 Progress towards PT goals: Progressing toward goals    Frequency    Min 2X/week          AM-PAC PT 6 Clicks Mobility   Outcome Measure  Help needed turning from your back to your side while in a flat bed without using bedrails?: A Little Help needed moving from lying on your back to sitting on the side of a flat bed without using bedrails?: A Little Help needed moving to and from a bed to a chair (including a wheelchair)?: A Lot Help needed standing up from a chair using your arms (e.g., wheelchair or bedside chair)?: A Little Help needed to walk in hospital room?: Total Help needed climbing 3-5 steps with a railing? : Total 6 Click Score: 13    End of Session Equipment Utilized During Treatment: Gait belt Activity Tolerance: Patient limited by fatigue Patient left: with call bell/phone within reach;with family/visitor present;in bed Nurse Communication: Mobility status PT Visit Diagnosis: Unsteadiness on feet (R26.81);Other abnormalities of gait and mobility (R26.89);Muscle weakness (generalized) (M62.81);Difficulty in walking, not elsewhere classified (R26.2);Other symptoms and signs  involving the nervous system (R29.898)     Time: 8646-8584 PT Time Calculation (min) (ACUTE ONLY): 22 min  Charges:    $Therapeutic Activity: 8-22 mins PT General Charges $$ ACUTE PT VISIT: 1 Visit                    Darryle George, PTA Acute Rehabilitation Services Secure Chat Preferred  [phone]    Darryle George 07/14/2024, 3:35 PM

## 2024-07-14 NOTE — Progress Notes (Signed)
 PROGRESS NOTE    [name] [name]  FMW:[id number] DOB: 16-Sep-1980 DOA: 05/19/2024 PCP: [facility]   Brief Narrative:  This 43-yrs-old female with PMH significant for HTN, Ischemic and hemorrhagic CVA, type 1 diabetes, gastroparesis, kidney and pancreas transplant 2014, subsequent failure of renal transplant, now ESRD on dialysis, anxiety, narcotic abuse. Presented to the [facility] ED from dialysis as code stroke on 9/15; subsequently found to have hemorrhagic stroke. Neurology, Neurosurgery, PCCM consulted. EVD was placed on the left side. Patient admitted to neuro ICU. She was intubated on admission. She had prolonged ICU stay with EVD drain which was changed to VP shunt on 10/15.  Extubated on 9/23.  Transferred to [facility] service on 10/17.     She was recently discharged from here in August after she was managed for ischemic/hemorrhagic stroke and was discharged to LTAC.   Important events:  9/15 admitted with ICH plus IVH, EVD was placed for hydrocephalus, intubated 9/22 palliative care meeting.  Family is insistent that they want full scope of care 9/23 Extubated 10/1 -developed fevers pancultures drawn and broad-spectrum antibiotics started 10/2 became afebrile after starting antibiotics, did not tolerate raising EVD 10/6 EVD was raised from 10 to 30 cm water , tolerating well, remained afebrile 10/7 EVD is at 30 cm water , CT scan will be done tomorrow morning, no overnight issues 10/8 she became somnolent, CT head showed increasing hydrocephalus, EVD was titrated down to 10 cm of water  with improvement in mental status 10/15 VP shunt placement 10/17 transferred to progressive unit with [name]     Assessment & Plan:   Principal Problem:   Hemorrhagic stroke [facility]) Active Problems:   Moderate protein-calorie malnutrition   Pressure injury of skin   Nontraumatic subcortical hemorrhage of right cerebral hemisphere [facility])   Acute right parietotemporal intraparenchymal  hemorrhage / Intraventricular hemorrhage: Uncontrolled hypertension: Obstructive hydrocephalus s/p EVD changed to VP shunt: Seizure disorder S/p VP shunt placement on 06/18/2024 by Dr. Mavis.  Remains stable on Vimpat  100 mg twice daily.  Neurosurgery following. Per them, no issue after the shunt. Continue current recommendations.  No evidence of CSF leakage or evidence of erythema.   ESRD on dialysis: History of failed renal transplant: Secondary hyperparathyroidism: Nephrology following. Patient on CellCept  and prednisone .  Dialyzed on MWF schedule. Has AV fistula on the right upper extremity. Needs to be able to dialyze in chair prior to discharge. Patient has completed hemodialysis in the recliner .  Tolerated well. TOC working on outpatient hemodialysis set up.  Acute bronchitis: Resolved.    Intermittent low-grade fever:  Thought to be secondary to intracranial hemorrhage. Previous blood cultures showed contamination.  S/p antibiotics for respiratory cultures positive for Serratia and Klebsiella.  Repeat cultures negative thus far. Patient afebrile for several days.   History of pancreatic transplantation:  Continue cyclosporine , mycophenolate , and prednisone .   Hypertension:  Patient currently on Coreg , amlodipine  and clonidine  on nondialysis days.    Anemia of chronic disease:  No signs of active bleeding. Hemoglobin stable.   Type 1 diabetes with vasculopathy/gastroparesis:  Not on long-term insulin  anymore due to pancreatic transplant.  Monitor blood sugars. On sliding scale.   Moderate protein calorie malnutrition/dysphagia:  Has PEG tube. Continue tube feeds.  SLP and dietitian following.    Stage I sacral pressure ulcer present on admission: Continue wound care   Debility/deconditioning/disposition/goals of care:  Young patient with multiple comorbidities.  Palliative care consulted for goals of care conversations, family insist she remains full code.   Palliative  has signed off 10/17.  Seen by PT OT.  They recommended CIR.  Reviewed by CIR, they recommend LTAC.  Peer to peer with insurance company was done on 06/25/2024, and they denied LTAC.  They recommended LTC.  Appeal for LTAC denied again.  TOC working on placing her to LTC.   Diarrhea: Continue Imodium  as needed.   DVT prophylaxis: SCDs Code Status: Full code Family Communication:Mother at bed side. Disposition Plan:    Status is: Inpatient Remains inpatient appropriate because:  Patient has completed hemodialysis in the recliner x 2. TOC working on finding outpatient chair.  Consultants:  Nephrology  Procedures:  Antimicrobials:  Anti-infectives (From admission, onward)    Start     Dose/Rate Route Frequency Ordered Stop   07/08/24 1530  fluconazole  (DIFLUCAN ) tablet 150 mg        150 mg Oral  Once 07/08/24 1437 07/08/24 1544   06/18/24 2030  ceFAZolin  (ANCEF ) IVPB 2g/100 mL premix  Status:  Discontinued        2 g 200 mL/hr over 30 Minutes Intravenous Every 8 hours 06/18/24 1454 06/19/24 1608   06/18/24 0600  ceFAZolin  (ANCEF ) IVPB 2g/100 mL premix        2 g 200 mL/hr over 30 Minutes Intravenous On call to O.R. 06/15/24 1152 06/18/24 1230   06/06/24 1400  piperacillin -tazobactam (ZOSYN ) IVPB 2.25 g        2.25 g 100 mL/hr over 30 Minutes Intravenous Every 8 hours 06/06/24 1017 06/09/24 0538   06/06/24 1200  vancomycin  (VANCOREADY) IVPB 500 mg/100 mL        500 mg 100 mL/hr over 60 Minutes Intravenous Every M-W-F (Hemodialysis) 06/04/24 1745 06/06/24 1900   06/04/24 1845  vancomycin  (VANCOCIN ) IVPB 1000 mg/200 mL premix        1,000 mg 200 mL/hr over 60 Minutes Intravenous  Once 06/04/24 1745 06/04/24 1915   06/04/24 1830  ceFEPIme  (MAXIPIME ) 2 g in sodium chloride  0.9 % 100 mL IVPB  Status:  Discontinued        2 g 200 mL/hr over 30 Minutes Intravenous Every M-W-F (Hemodialysis) 06/04/24 1743 06/06/24 1017   05/27/24 1015  cefTRIAXone  (ROCEPHIN ) 2 g in sodium  chloride 0.9 % 100 mL IVPB        2 g 200 mL/hr over 30 Minutes Intravenous Every 24 hours 05/27/24 0918 05/29/24 1014   05/23/24 1530  ceFEPIme  (MAXIPIME ) 1 g in sodium chloride  0.9 % 100 mL IVPB  Status:  Discontinued        1 g 200 mL/hr over 30 Minutes Intravenous Every 24 hours 05/23/24 1433 05/27/24 0918   05/22/24 2200  Ampicillin -Sulbactam (UNASYN ) 3 g in sodium chloride  0.9 % 100 mL IVPB  Status:  Discontinued        3 g 200 mL/hr over 30 Minutes Intravenous Every 24 hours 05/22/24 0834 05/23/24 1433   05/22/24 0930  Ampicillin -Sulbactam (UNASYN ) 3 g in sodium chloride  0.9 % 100 mL IVPB        3 g 200 mL/hr over 30 Minutes Intravenous  Once 05/22/24 0834 05/22/24 1900      Subjective: Patient was seen and examined at HD suite.  Overnight events noted. She was getting HD in the recliner, She is doing good so far. Mother very excited for her disposition soon.  Objective: Vitals:   07/14/24 1154 07/14/24 1215 07/14/24 1216 07/14/24 1307  BP: (!) 151/57 (!) 133/55 (!) 149/48 (!) 127/57  Pulse: 83 84 84 87  Resp: 18  11 14 16   Temp:  97.7 F (36.5 C)  98.2 F (36.8 C)  TempSrc:  Oral  Oral  SpO2: 100% 100% 100% 99%  Weight:      Height:        Intake/Output Summary (Last 24 hours) at 07/14/2024 1316 Last data filed at 07/14/2024 1216 Gross per 24 hour  Intake 180 ml  Output 3000 ml  Net -2820 ml    Filed Weights   07/11/24 0500 07/13/24 0500 07/14/24 0500  Weight: 55 kg 55.2 kg 56.1 kg    Examination:  General exam: Appears calm and comfortable, not in any acute distress. Respiratory system: CTA Bilaterally. Respiratory effort normal. RR 14 Cardiovascular system: S1 & S2 heard, RRR. No JVD, murmurs, rubs, gallops or clicks.  Gastrointestinal system: Abdomen is nondistended, soft and nontender. Normal bowel sounds heard. Central nervous system: Alert and oriented x 3. No focal neurological deficits. Extremities: No edema, No cyanosis, No clubbing Skin: No  rashes, lesions or ulcers Psychiatry: Judgement and insight appear normal. Mood & affect appropriate.    Data Reviewed: I have personally reviewed following labs and imaging studies  CBC: Recent Labs  Lab 07/08/24 0634 07/09/24 0125 07/11/24 0700 07/12/24 1327 07/14/24 0213  WBC 4.9 3.8* 4.5  --  5.9  HGB 8.2* 7.5* 7.0* 7.2* 7.0*  HCT 25.4* 23.6* 21.1* 22.2* 21.2*  MCV 96.6 96.7 94.2  --  95.9  PLT 161 168 157  --  160   Basic Metabolic Panel: Recent Labs  Lab 07/08/24 0634 07/09/24 0125 07/10/24 0811 07/11/24 0700 07/14/24 0213  NA 133* 129* 133* 129* 128*  K 4.1 4.5 3.6 3.6 3.9  CL 89* 84* 90* 89* 89*  CO2 30 27 24 26 26   GLUCOSE 108* 104* 90 133* 108*  BUN 44* 60* 23* 40* 59*  CREATININE 3.04* 4.02* 2.68* 4.13* 4.72*  CALCIUM  10.0 10.2 10.1 9.9 10.1  MG 2.7* 3.2*  --   --  3.0*  PHOS 3.8 4.6  --  4.4 2.4*   GFR: Estimated Creatinine Clearance: 12.8 mL/min (A) (by C-G formula based on SCr of 4.72 mg/dL (H)). Liver Function Tests: Recent Labs  Lab 07/11/24 0700  ALBUMIN  2.7*    No results for input(s): LIPASE, AMYLASE in the last 168 hours. No results for input(s): AMMONIA in the last 168 hours. Coagulation Profile: No results for input(s): INR, PROTIME in the last 168 hours. Cardiac Enzymes: No results for input(s): CKTOTAL, CKMB, CKMBINDEX, TROPONINI in the last 168 hours. BNP (last 3 results) No results for input(s): PROBNP in the last 8760 hours. HbA1C: No results for input(s): HGBA1C in the last 72 hours. CBG: Recent Labs  Lab 07/09/24 1632 07/09/24 2035 07/09/24 2341 07/10/24 0339 07/14/24 1307  GLUCAP 122* 86 86 88 128*   Lipid Profile: No results for input(s): CHOL, HDL, LDLCALC, TRIG, CHOLHDL, LDLDIRECT in the last 72 hours. Thyroid Function Tests: No results for input(s): TSH, T4TOTAL, FREET4, T3FREE, THYROIDAB in the last 72 hours. Anemia Panel: No results for input(s): VITAMINB12,  FOLATE, FERRITIN, TIBC, IRON , RETICCTPCT in the last 72 hours. Sepsis Labs: No results for input(s): PROCALCITON, LATICACIDVEN in the last 168 hours.  No results found for this or any previous visit (from the past 240 hours).  Radiology Studies: No results found. Scheduled Meds:  carvedilol   25 mg Per Tube BID WC   Chlorhexidine  Gluconate Cloth  6 each Topical Q0600   cinacalcet   30 mg Oral Q M,W,F   cloNIDine   0.3  mg Per Tube Once per day on Sunday Tuesday Thursday Saturday   cycloSPORINE   200 mg Per Tube QPM   cycloSPORINE   225 mg Per Tube Daily   darbepoetin (ARANESP ) injection - DIALYSIS  150 mcg Subcutaneous Q Thu-1800   famotidine   10 mg Per Tube QHS   feeding supplement (NEPRO CARB STEADY)  1,000 mL Per Tube Q24H   fiber  1 packet Per Tube TID   gabapentin   100 mg Per Tube Daily   Gerhardt's butt cream   Topical BID   lacosamide   100 mg Per Tube BID   liver oil-zinc  oxide   Topical TID   multivitamin with minerals  1 tablet Per Tube Daily   mycophenolate   500 mg Per Tube BID   mouth rinse  15 mL Mouth Rinse 4 times per day   oxidized cellulose  1 each Topical Once   predniSONE   15 mg Per Tube Q breakfast   Continuous Infusions:   LOS: 56 days    Time spent: 35 mins    Darcel Dawley, MD [name]   If 7PM-7AM, please contact night-coverage

## 2024-07-14 NOTE — Progress Notes (Signed)
 Pt is back to the unit, VS stable

## 2024-07-14 NOTE — H&P (Signed)
  Received patient in bed to unit.   Informed consent signed and in chart.    TX duration:     Transported by  Hand-off given to patient's nurse.    Access used: Rt AVF Access issues: None   Total UF removed: 3000 Medication(s) given: Dilaudid  0.5 mg Post HD VS: 149/48    Hunter Hacking LPN [facility]

## 2024-07-14 NOTE — Plan of Care (Signed)
  Problem: Education: Goal: Ability to describe self-care measures that may prevent or decrease complications (Diabetes Survival Skills Education) will improve Outcome: Progressing Goal: Individualized Educational Video(s) Outcome: Progressing   Problem: Coping: Goal: Ability to adjust to condition or change in health will improve Outcome: Progressing   Problem: Fluid Volume: Goal: Ability to maintain a balanced intake and output will improve Outcome: Progressing   Problem: Health Behavior/Discharge Planning: Goal: Ability to identify and utilize available resources and services will improve Outcome: Progressing Goal: Ability to manage health-related needs will improve Outcome: Progressing   Problem: Nutritional: Goal: Maintenance of adequate nutrition will improve Outcome: Progressing Goal: Progress toward achieving an optimal weight will improve Outcome: Progressing   Problem: Tissue Perfusion: Goal: Adequacy of tissue perfusion will improve Outcome: Progressing

## 2024-07-14 NOTE — Plan of Care (Signed)
  Problem: Fluid Volume: Goal: Ability to maintain a balanced intake and output will improve Outcome: Progressing   Problem: Education: Goal: Knowledge of disease or condition will improve Outcome: Progressing   Problem: Coping: Goal: Will verbalize positive feelings about self Outcome: Progressing Goal: Will identify appropriate support needs Outcome: Progressing   Problem: Self-Care: Goal: Ability to communicate needs accurately will improve Outcome: Progressing   Problem: Nutrition: Goal: Risk of aspiration will decrease Outcome: Progressing Goal: Dietary intake will improve Outcome: Progressing   Problem: Clinical Measurements: Goal: Respiratory complications will improve Outcome: Progressing Goal: Cardiovascular complication will be avoided Outcome: Progressing   Problem: Nutrition: Goal: Adequate nutrition will be maintained Outcome: Progressing

## 2024-07-14 NOTE — Progress Notes (Signed)
 [facility] Progress Note   Subjective:   Seen in [facility]. On dialysis in recliner. She's more uncomfortable today with buttocks pain  Has been coughing at night    Objective Vitals:   07/14/24 0500 07/14/24 0714 07/14/24 0816 07/14/24 0839  BP:  (!) 141/56 (!) 157/64 (!) 153/56  Pulse:  83 82 79  Resp:  16 (!) 23 18  Temp:  98.4 F (36.9 C) 97.6 F (36.4 C)   TempSrc:  Oral    SpO2:  96% 100% 100%  Weight: 56.1 kg     Height:       Physical Exam General: Chronically ill appearing woman, NAD. Room air Heart: RRR, no murmur Lungs: CTA anteriorly Abdomen: soft, PEG in place Extremities: no LE edema Dialysis Access: R AVF +t/b  Additional Objective Labs: Basic Metabolic Panel: Recent Labs  Lab 07/09/24 0125 07/10/24 0811 07/11/24 0700 07/14/24 0213  NA 129* 133* 129* 128*  K 4.5 3.6 3.6 3.9  CL 84* 90* 89* 89*  CO2 27 24 26 26   GLUCOSE 104* 90 133* 108*  BUN 60* 23* 40* 59*  CREATININE 4.02* 2.68* 4.13* 4.72*  CALCIUM  10.2 10.1 9.9 10.1  PHOS 4.6  --  4.4 2.4*   CBC: Recent Labs  Lab 07/08/24 0634 07/09/24 0125 07/11/24 0700 07/12/24 1327 07/14/24 0213  WBC 4.9 3.8* 4.5  --  5.9  HGB 8.2* 7.5* 7.0* 7.2* 7.0*  HCT 25.4* 23.6* 21.1* 22.2* 21.2*  MCV 96.6 96.7 94.2  --  95.9  PLT 161 168 157  --  160   Medications:   carvedilol   25 mg Per Tube BID WC   Chlorhexidine  Gluconate Cloth  6 each Topical Q0600   cinacalcet   30 mg Oral Q M,W,F   cloNIDine   0.3 mg Per Tube Once per day on Sunday Tuesday Thursday Saturday   cycloSPORINE   200 mg Per Tube QPM   cycloSPORINE   225 mg Per Tube Daily   darbepoetin (ARANESP ) injection - DIALYSIS  150 mcg Subcutaneous Q Thu-1800   famotidine   10 mg Per Tube QHS   feeding supplement (NEPRO CARB STEADY)  1,000 mL Per Tube Q24H   fiber  1 packet Per Tube TID   gabapentin   100 mg Per Tube Daily   Gerhardt's butt cream   Topical BID   lacosamide   100 mg Per Tube BID   liver oil-zinc  oxide   Topical TID    multivitamin with minerals  1 tablet Per Tube Daily   mycophenolate   500 mg Per Tube BID   mouth rinse  15 mL Mouth Rinse 4 times per day   oxidized cellulose  1 each Topical Once   predniSONE   15 mg Per Tube Q breakfast    Dialysis Orders MWF - NW 3:45hr, 400/A1.5, EDW 55.2kg, 2K/2Ca bath, AVF, no heparin  - Prev on Mircera 150mcg IV q 2 weeks - No VDRA   Assessment/Plan: Acute R intracranial hemorrhage: 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 Needs ongoing rehab. ESRD: Continue HD on MWF schedule. HD today  HTN/volume: BP higher today, no LE edema, UF as tolerated. Only getting BP meds on non-HD days -> will try to simplify - coreg  to BID everyday. Anemia of ESRD: Hgb 7s- down again. Transfuse prn Hgb < 7.  Continue Aranesp  150mcg q Thursday - last dose 11/6 Secondary HPTH: CorrCa high, Phos ok. Continue sensipar , not on VDRA. Nutrition: Alb low, continue Nepro TF. Consider repeat swallowing study in future. Hx pancreas/kidney transplant: Remains on  IS for pancreas function. Hx T1D/gastroparesis Buttocks wound/?yeast: Using antifungal powder, improving. Dispo: Insurance declined LTAC, will be going to SNF. Tolerated HD in recliner on 11/7    Maisie Ronnald Acosta PA-C [facility] 07/14/2024,9:49 AM

## 2024-07-15 DIAGNOSIS — I619 Nontraumatic intracerebral hemorrhage, unspecified: Secondary | ICD-10-CM | POA: Diagnosis not present

## 2024-07-15 LAB — GLUCOSE, CAPILLARY
Glucose-Capillary: 111 mg/dL — ABNORMAL HIGH (ref 70–99)
Glucose-Capillary: 124 mg/dL — ABNORMAL HIGH (ref 70–99)
Glucose-Capillary: 125 mg/dL — ABNORMAL HIGH (ref 70–99)
Glucose-Capillary: 141 mg/dL — ABNORMAL HIGH (ref 70–99)
Glucose-Capillary: 146 mg/dL — ABNORMAL HIGH (ref 70–99)
Glucose-Capillary: 157 mg/dL — ABNORMAL HIGH (ref 70–99)
Glucose-Capillary: 202 mg/dL — ABNORMAL HIGH (ref 70–99)

## 2024-07-15 MED ORDER — CLONIDINE HCL 0.1 MG PO TABS
0.1000 mg | ORAL_TABLET | Freq: Two times a day (BID) | ORAL | Status: DC
  Administered 2024-07-15 – 2024-07-17 (×4): 0.1 mg via ORAL
  Filled 2024-07-15 (×4): qty 1

## 2024-07-15 MED ORDER — BANATROL TF EN LIQD
60.0000 mL | Freq: Three times a day (TID) | ENTERAL | Status: DC
  Administered 2024-07-15 – 2024-08-07 (×62): 60 mL
  Filled 2024-07-15 (×65): qty 60

## 2024-07-15 NOTE — Plan of Care (Signed)
 Problem: Education: Goal: Ability to describe self-care measures that may prevent or decrease complications (Diabetes Survival Skills Education) will improve Outcome: Progressing Goal: Individualized Educational Video(s) Outcome: Progressing   Problem: Coping: Goal: Ability to adjust to condition or change in health will improve Outcome: Progressing   Problem: Fluid Volume: Goal: Ability to maintain a balanced intake and output will improve Outcome: Progressing   Problem: Health Behavior/Discharge Planning: Goal: Ability to identify and utilize available resources and services will improve Outcome: Progressing Goal: Ability to manage health-related needs will improve Outcome: Progressing   Problem: Metabolic: Goal: Ability to maintain appropriate glucose levels will improve Outcome: Progressing   Problem: Nutritional: Goal: Maintenance of adequate nutrition will improve Outcome: Progressing Goal: Progress toward achieving an optimal weight will improve Outcome: Progressing   Problem: Skin Integrity: Goal: Risk for impaired skin integrity will decrease Outcome: Progressing   Problem: Tissue Perfusion: Goal: Adequacy of tissue perfusion will improve Outcome: Progressing   Problem: Education: Goal: Knowledge of disease or condition will improve Outcome: Progressing Goal: Knowledge of secondary prevention will improve (MUST DOCUMENT ALL) Outcome: Progressing Goal: Knowledge of patient specific risk factors will improve (DELETE if not current risk factor) Outcome: Progressing   Problem: Intracerebral Hemorrhage Tissue Perfusion: Goal: Complications of Intracerebral Hemorrhage will be minimized Outcome: Progressing   Problem: Coping: Goal: Will verbalize positive feelings about self Outcome: Progressing Goal: Will identify appropriate support needs Outcome: Progressing   Problem: Health Behavior/Discharge Planning: Goal: Ability to manage health-related needs will  improve Outcome: Progressing Goal: Goals will be collaboratively established with patient/family Outcome: Progressing   Problem: Self-Care: Goal: Ability to participate in self-care as condition permits will improve Outcome: Progressing Goal: Verbalization of feelings and concerns over difficulty with self-care will improve Outcome: Progressing Goal: Ability to communicate needs accurately will improve Outcome: Progressing   Problem: Nutrition: Goal: Risk of aspiration will decrease Outcome: Progressing Goal: Dietary intake will improve Outcome: Progressing   Problem: Education: Goal: Knowledge of General Education information will improve Description: Including pain rating scale, medication(s)/side effects and non-pharmacologic comfort measures Outcome: Progressing   Problem: Health Behavior/Discharge Planning: Goal: Ability to manage health-related needs will improve Outcome: Progressing   Problem: Clinical Measurements: Goal: Ability to maintain clinical measurements within normal limits will improve Outcome: Progressing Goal: Will remain free from infection Outcome: Progressing Goal: Diagnostic test results will improve Outcome: Progressing Goal: Respiratory complications will improve Outcome: Progressing Goal: Cardiovascular complication will be avoided Outcome: Progressing   Problem: Activity: Goal: Risk for activity intolerance will decrease Outcome: Progressing   Problem: Nutrition: Goal: Adequate nutrition will be maintained Outcome: Progressing   Problem: Coping: Goal: Level of anxiety will decrease Outcome: Progressing   Problem: Elimination: Goal: Will not experience complications related to bowel motility Outcome: Progressing   Problem: Pain Managment: Goal: General experience of comfort will improve and/or be controlled Outcome: Progressing   Problem: Safety: Goal: Ability to remain free from injury will improve Outcome: Progressing    Problem: Skin Integrity: Goal: Risk for impaired skin integrity will decrease Outcome: Progressing   Problem: Education: Goal: Knowledge of the prescribed therapeutic regimen will improve Outcome: Progressing   Problem: Clinical Measurements: Goal: Usual level of consciousness will be regained or maintained. Outcome: Progressing Goal: Neurologic status will improve Outcome: Progressing Goal: Ability to maintain intracranial pressure will improve Outcome: Progressing   Problem: Skin Integrity: Goal: Demonstration of wound healing without infection will improve Outcome: Progressing   Problem: Education: Goal: Knowledge of disease and its progression will improve Outcome: Progressing  Goal: Individualized Educational Video(s) Outcome: Progressing   Problem: Fluid Volume: Goal: Compliance with measures to maintain balanced fluid volume will improve Outcome: Progressing

## 2024-07-15 NOTE — Progress Notes (Signed)
 PROGRESS NOTE    [name] [name]  FMW:[id number] DOB: July 20, 1981 DOA: 05/19/2024 PCP: [facility]   Brief Narrative:  This 43-yrs-old female with PMH significant for HTN, Ischemic and hemorrhagic CVA, type 1 diabetes, gastroparesis, kidney and pancreas transplant 2014, subsequent failure of renal transplant, now ESRD on dialysis, anxiety, narcotic abuse. Presented to the [facility] ED from dialysis as code stroke on 9/15; subsequently found to have hemorrhagic stroke. Neurology, Neurosurgery, PCCM consulted. EVD was placed on the left side. Patient admitted to neuro ICU. She was intubated on admission. She had prolonged ICU stay with EVD drain which was changed to VP shunt on 10/15.  Extubated on 9/23.  Transferred to [facility] service on 10/17.     She was recently discharged from here in August after she was managed for ischemic/hemorrhagic stroke and was discharged to LTAC.   Important events:  9/15 admitted with ICH plus IVH, EVD was placed for hydrocephalus, intubated 9/22 palliative care meeting.  Family is insistent that they want full scope of care 9/23 Extubated 10/1 -developed fevers pancultures drawn and broad-spectrum antibiotics started 10/2 became afebrile after starting antibiotics, did not tolerate raising EVD 10/6 EVD was raised from 10 to 30 cm water , tolerating well, remained afebrile 10/7 EVD is at 30 cm water , CT scan will be done tomorrow morning, no overnight issues 10/8 she became somnolent, CT head showed increasing hydrocephalus, EVD was titrated down to 10 cm of water  with improvement in mental status 10/15 VP shunt placement 10/17 transferred to progressive unit with [name]     Assessment & Plan:   Principal Problem:   Hemorrhagic stroke [facility]) Active Problems:   Moderate protein-calorie malnutrition   Pressure injury of skin   Nontraumatic subcortical hemorrhage of right cerebral hemisphere [place])   Acute right parietotemporal intraparenchymal  hemorrhage / Intraventricular hemorrhage: Uncontrolled hypertension: Obstructive hydrocephalus s/p EVD changed to VP shunt: Seizure disorder S/p VP shunt placement on 06/18/2024 by Dr. Mavis.  Remains stable on Vimpat  100 mg twice daily.  Neurosurgery following. Per them, no issue after the shunt. Continue current recommendations.  No evidence of CSF leakage or evidence of erythema.   ESRD on dialysis: History of failed renal transplant: Secondary hyperparathyroidism: Nephrology following. Patient on CellCept  and prednisone .  Dialyzed on MWF schedule. Has AV fistula on the right upper extremity. Needs to be able to dialyze in chair prior to discharge. Patient has completed hemodialysis in the recliner  X 3.  Tolerated well. Next HD 11/12. TOC working on outpatient hemodialysis set up.  Acute bronchitis: Resolved.    Intermittent low-grade fever:  Thought to be secondary to intracranial hemorrhage. Previous blood cultures showed contamination.  S/p antibiotics for respiratory cultures positive for Serratia and Klebsiella.  Repeat cultures negative thus far. Patient afebrile for several days.   History of pancreatic transplantation:  Continue cyclosporine , mycophenolate , and prednisone .   Hypertension:  Patient currently on Coreg , amlodipine  and clonidine  on nondialysis days.    Anemia of chronic disease:  No signs of active bleeding. Hemoglobin around 7.0,  Transfuse if Hb below 7.0   Type 1 diabetes with vasculopathy/gastroparesis:  Not on long-term insulin  anymore due to pancreatic transplant.  Monitor blood sugars. On sliding scale.   Moderate protein calorie malnutrition/dysphagia:  Has PEG tube. Continue tube feeds.  SLP and dietitian following.    Stage I sacral pressure ulcer present on admission: Continue wound care   Debility/deconditioning/disposition/goals of care:  Young patient with multiple comorbidities.  Palliative care consulted for goals  of care  conversations, family insist she remains full code.  Palliative has signed off 10/17.  Seen by PT OT.  They recommended CIR.  Reviewed by CIR, they recommend LTAC.  Peer to peer with insurance company was done on 06/25/2024, and they denied LTAC.  They recommended LTC.  Appeal for LTAC denied again.  TOC working on placing her to LTC.   Diarrhea: Continue Imodium  as needed.   DVT prophylaxis: SCDs Code Status: Full code Family Communication:Mother at bed side. Disposition Plan:    Status is: Inpatient Remains inpatient appropriate because:  Patient has completed hemodialysis in the recliner x 3. TOC working on finding outpatient chair/ LTAC  Consultants:  Nephrology  Procedures:  Antimicrobials:  Anti-infectives (From admission, onward)    Start     Dose/Rate Route Frequency Ordered Stop   07/08/24 1530  fluconazole  (DIFLUCAN ) tablet 150 mg        150 mg Oral  Once 07/08/24 1437 07/08/24 1544   06/18/24 2030  ceFAZolin  (ANCEF ) IVPB 2g/100 mL premix  Status:  Discontinued        2 g 200 mL/hr over 30 Minutes Intravenous Every 8 hours 06/18/24 1454 06/19/24 1608   06/18/24 0600  ceFAZolin  (ANCEF ) IVPB 2g/100 mL premix        2 g 200 mL/hr over 30 Minutes Intravenous On call to O.R. 06/15/24 1152 06/18/24 1230   06/06/24 1400  piperacillin -tazobactam (ZOSYN ) IVPB 2.25 g        2.25 g 100 mL/hr over 30 Minutes Intravenous Every 8 hours 06/06/24 1017 06/09/24 0538   06/06/24 1200  vancomycin  (VANCOREADY) IVPB 500 mg/100 mL        500 mg 100 mL/hr over 60 Minutes Intravenous Every M-W-F (Hemodialysis) 06/04/24 1745 06/06/24 1900   06/04/24 1845  vancomycin  (VANCOCIN ) IVPB 1000 mg/200 mL premix        1,000 mg 200 mL/hr over 60 Minutes Intravenous  Once 06/04/24 1745 06/04/24 1915   06/04/24 1830  ceFEPIme  (MAXIPIME ) 2 g in sodium chloride  0.9 % 100 mL IVPB  Status:  Discontinued        2 g 200 mL/hr over 30 Minutes Intravenous Every M-W-F (Hemodialysis) 06/04/24 1743 06/06/24 1017    05/27/24 1015  cefTRIAXone  (ROCEPHIN ) 2 g in sodium chloride  0.9 % 100 mL IVPB        2 g 200 mL/hr over 30 Minutes Intravenous Every 24 hours 05/27/24 0918 05/29/24 1014   05/23/24 1530  ceFEPIme  (MAXIPIME ) 1 g in sodium chloride  0.9 % 100 mL IVPB  Status:  Discontinued        1 g 200 mL/hr over 30 Minutes Intravenous Every 24 hours 05/23/24 1433 05/27/24 0918   05/22/24 2200  Ampicillin -Sulbactam (UNASYN ) 3 g in sodium chloride  0.9 % 100 mL IVPB  Status:  Discontinued        3 g 200 mL/hr over 30 Minutes Intravenous Every 24 hours 05/22/24 0834 05/23/24 1433   05/22/24 0930  Ampicillin -Sulbactam (UNASYN ) 3 g in sodium chloride  0.9 % 100 mL IVPB        3 g 200 mL/hr over 30 Minutes Intravenous  Once 05/22/24 0834 05/22/24 1900      Subjective: Patient was seen and examined at bed side.  Overnight events noted. She is doing good so far. Mother is very excited for her disposition soon.  Objective: Vitals:   07/15/24 0400 07/15/24 0744 07/15/24 0837 07/15/24 1125  BP: 138/60 (!) 172/72 (!) 152/60 (!) 153/67  Pulse: 77  76  78  Resp: 16 18  18   Temp: 98.6 F (37 C) 98.5 F (36.9 C)  98.6 F (37 C)  TempSrc: Oral Oral  Oral  SpO2: 96% 95%  96%  Weight:      Height:       No intake or output data in the 24 hours ending 07/15/24 1234   Filed Weights   07/11/24 0500 07/13/24 0500 07/14/24 0500  Weight: 55 kg 55.2 kg 56.1 kg    Examination:  General exam: Appears calm and comfortable, not in any acute distress. Respiratory system: CTA Bilaterally. Respiratory effort normal. RR 16 Cardiovascular system: S1 & S2 heard, RRR. No JVD, murmurs, rubs, gallops or clicks.  Gastrointestinal system: Abdomen is nondistended, soft and nontender. Normal bowel sounds heard. Central nervous system: Alert and oriented x 3. No focal neurological deficits. Extremities: No edema, No cyanosis, No clubbing Skin: No rashes, lesions or ulcers Psychiatry: Judgement and insight appear normal. Mood  & affect appropriate.    Data Reviewed: I have personally reviewed following labs and imaging studies  CBC: Recent Labs  Lab 07/09/24 0125 07/11/24 0700 07/12/24 1327 07/14/24 0213  WBC 3.8* 4.5  --  5.9  HGB 7.5* 7.0* 7.2* 7.0*  HCT 23.6* 21.1* 22.2* 21.2*  MCV 96.7 94.2  --  95.9  PLT 168 157  --  160   Basic Metabolic Panel: Recent Labs  Lab 07/09/24 0125 07/10/24 0811 07/11/24 0700 07/14/24 0213  NA 129* 133* 129* 128*  K 4.5 3.6 3.6 3.9  CL 84* 90* 89* 89*  CO2 27 24 26 26   GLUCOSE 104* 90 133* 108*  BUN 60* 23* 40* 59*  CREATININE 4.02* 2.68* 4.13* 4.72*  CALCIUM  10.2 10.1 9.9 10.1  MG 3.2*  --   --  3.0*  PHOS 4.6  --  4.4 2.4*   GFR: Estimated Creatinine Clearance: 12.8 mL/min (A) (by C-G formula based on SCr of 4.72 mg/dL (H)). Liver Function Tests: Recent Labs  Lab 07/11/24 0700  ALBUMIN  2.7*    No results for input(s): LIPASE, AMYLASE in the last 168 hours. No results for input(s): AMMONIA in the last 168 hours. Coagulation Profile: No results for input(s): INR, PROTIME in the last 168 hours. Cardiac Enzymes: No results for input(s): CKTOTAL, CKMB, CKMBINDEX, TROPONINI in the last 168 hours. BNP (last 3 results) No results for input(s): PROBNP in the last 8760 hours. HbA1C: No results for input(s): HGBA1C in the last 72 hours. CBG: Recent Labs  Lab 07/14/24 2000 07/14/24 2359 07/15/24 0439 07/15/24 0744 07/15/24 1124  GLUCAP 169* 146* 124* 111* 157*   Lipid Profile: No results for input(s): CHOL, HDL, LDLCALC, TRIG, CHOLHDL, LDLDIRECT in the last 72 hours. Thyroid Function Tests: No results for input(s): TSH, T4TOTAL, FREET4, T3FREE, THYROIDAB in the last 72 hours. Anemia Panel: No results for input(s): VITAMINB12, FOLATE, FERRITIN, TIBC, IRON , RETICCTPCT in the last 72 hours. Sepsis Labs: No results for input(s): PROCALCITON, LATICACIDVEN in the last 168 hours.  No results  found for this or any previous visit (from the past 240 hours).  Radiology Studies: No results found. Scheduled Meds:  carvedilol   25 mg Per Tube BID WC   Chlorhexidine  Gluconate Cloth  6 each Topical Q0600   cinacalcet   30 mg Oral Q M,W,F   cloNIDine   0.1 mg Oral BID   cycloSPORINE   200 mg Per Tube QPM   cycloSPORINE   225 mg Per Tube Daily   darbepoetin (ARANESP ) injection - DIALYSIS  150  mcg Subcutaneous Q Thu-1800   famotidine   10 mg Per Tube QHS   feeding supplement (NEPRO CARB STEADY)  1,000 mL Per Tube Q24H   fiber supplement (BANATROL TF)  60 mL Per Tube TID   gabapentin   100 mg Per Tube Daily   Gerhardt's butt cream   Topical BID   lacosamide   100 mg Per Tube BID   liver oil-zinc  oxide   Topical TID   multivitamin with minerals  1 tablet Per Tube Daily   mycophenolate   500 mg Per Tube BID   mouth rinse  15 mL Mouth Rinse 4 times per day   oxidized cellulose  1 each Topical Once   predniSONE   15 mg Per Tube Q breakfast   Continuous Infusions:   LOS: 57 days    Time spent: 35 mins    Darcel Dawley, MD [name]   If 7PM-7AM, please contact night-coverage

## 2024-07-15 NOTE — Progress Notes (Signed)
 Occupational Therapy Treatment Patient Details Name: [name] [name] MRN: [id number] DOB: [birth year] Today's Date: 07/15/2024   History of present illness 43 yo female presents 05/19/24 for left gaze and L side weakness. 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 Intubated 9/15-9/23. EVD 9/15- 10/15; s/p shunt 10/15. S/p laparoscopy with intra-abdominal assistance ventricular-peritoneal shunt placement 10/15. PMH: HTN, ESRD on HD s/p renal and pancreatic transplants, DM, gastroparesis, multiple strokes (ischemic and hemorrhagic) with prepontine SAH, hydrocephalus requiring EVD placement   OT comments  Patient received in supine asleep but able to arouse. Patient agreeable to OT treatment but lethargic during session. Patient was max assist of one to get to EOB and when she stood from EOB she was found to have soiled bed.  Cleaning attempted while standing but patient unable to tolerance and was transferred to recliner.  Patient assisted with cleaning with lateral leaning. UE HEP attempted but patient having difficulty following directions due to lethargic. Patient remained in recliner with mother present.  Discharge recommendations continue to be appropriate. Acute OT to continue to follow to address established goals to facilitate DC to next venue of care.        If plan is discharge home, recommend the following:  Assistance with cooking/housework;Assist for transportation;Help with stairs or ramp for entrance;A lot of help with walking and/or transfers;A lot of help with bathing/dressing/bathroom   Equipment Recommendations  Other (comment) (defer)    Recommendations for Other Services      Precautions / Restrictions Precautions Precautions: Fall Recall of Precautions/Restrictions: Impaired Precaution/Restrictions Comments: PEG Restrictions Weight Bearing  Restrictions Per Provider Order: No       Mobility Bed Mobility Overal bed mobility: Needs Assistance Bed Mobility: Supine to Sit     Supine to sit: Max assist     General bed mobility comments: assistance with BLEs and trunk    Transfers Overall transfer level: Needs assistance Equipment used: Rolling walker (2 wheels) Transfers: Sit to/from Stand, Bed to chair/wheelchair/BSC Sit to Stand: Mod assist     Step pivot transfers: Mod assist     General transfer comment: stood from EOB with mod assist and required assistance with balance and walker management to transfer to recliner     Balance Overall balance assessment: Needs assistance Sitting-balance support: Bilateral upper extremity supported, Feet supported Sitting balance-Leahy Scale: Fair Sitting balance - Comments: CGA sitting EOB   Standing balance support: Reliant on assistive device for balance, Bilateral upper extremity supported, During functional activity Standing balance-Leahy Scale: Poor Standing balance comment: reliant on RW for support                           ADL either performed or assessed with clinical judgement   ADL Overall ADL's : Needs assistance/impaired     Grooming: Wash/dry hands;Wash/dry face;Sitting;Minimal assistance Grooming Details (indicate cue type and reason): in recliner with assistance to complete     Lower Body Bathing: Total assistance;Sitting/lateral leans Lower Body Bathing Details (indicate cue type and reason): attempted in standing but unable to tolerance and was performed in recliner Upper Body Dressing : Moderate assistance;Sitting Upper Body Dressing Details (indicate cue type and reason): gown to cover back Lower Body Dressing: Total assistance;Bed level Lower Body Dressing Details (indicate cue type and reason): to donn socks  Extremity/Trunk Assessment              Water Quality Scientist Communication: Impaired Factors Affecting Communication: Difficulty expressing self;Reduced clarity of speech   Cognition Arousal: Lethargic Behavior During Therapy: Flat affect Cognition: Cognition impaired     Awareness: Intellectual awareness impaired Memory impairment (select all impairments): Short-term memory Attention impairment (select first level of impairment): Focused attention Executive functioning impairment (select all impairments): Initiation, Organization, Sequencing, Problem solving OT - Cognition Comments: lethargic, required cues to stay on tasks                 Following commands: Impaired Following commands impaired: Follows one step commands with increased time      Cueing   Cueing Techniques: Verbal cues, Gestural cues, Tactile cues, Visual cues  Exercises      Shoulder Instructions       General Comments      Pertinent Vitals/ Pain       Pain Assessment Pain Assessment: Faces Faces Pain Scale: Hurts even more Pain Location: bottom Pain Descriptors / Indicators: Discomfort, Grimacing, Guarding Pain Intervention(s): Limited activity within patient's tolerance, Monitored during session, Repositioned  Home Living                                          Prior Functioning/Environment              Frequency  Min 2X/week        Progress Toward Goals  OT Goals(current goals can now be found in the care plan section)  Progress towards OT goals: Progressing toward goals  Acute Rehab OT Goals Patient Stated Goal: to rest OT Goal Formulation: With patient Time For Goal Achievement: 07/22/24 Potential to Achieve Goals: Good ADL Goals Pt Will Perform Grooming: with set-up;sitting Pt Will Perform Lower Body Dressing: with mod assist;sit to/from stand Pt Will Transfer to Toilet: with mod assist;stand pivot transfer;with transfer board Pt/caregiver will Perform Home Exercise Program: Both  right and left upper extremity;With written HEP provided;Increased strength Additional ADL Goal #1: pt will follow one step commands 100% of OT session to optimize functional participation in BADL Additional ADL Goal #2: pt will sustain attention to ADL task for ~2 minutes with mod cues.  Plan      Co-evaluation                 AM-PAC OT 6 Clicks Daily Activity     Outcome Measure   Help from another person eating meals?: Total Help from another person taking care of personal grooming?: A Lot Help from another person toileting, which includes using toliet, bedpan, or urinal?: Total Help from another person bathing (including washing, rinsing, drying)?: A Lot Help from another person to put on and taking off regular upper body clothing?: A Lot Help from another person to put on and taking off regular lower body clothing?: Total 6 Click Score: 9    End of Session Equipment Utilized During Treatment: Gait belt;Rolling walker (2 wheels)  OT Visit Diagnosis: Unsteadiness on feet (R26.81);Muscle weakness (generalized) (M62.81);History of falling (Z91.81);Other symptoms and signs involving cognitive function;Cognitive communication deficit (R41.841);Low vision, both eyes (H54.2) Symptoms and signs involving cognitive functions: Nontraumatic SAH   Activity Tolerance Patient limited by lethargy;Patient limited by pain   Patient Left in  chair;with call bell/phone within reach;with family/visitor present   Nurse Communication Mobility status        Time: 8955-8887 OT Time Calculation (min): 28 min  Charges: OT General Charges $OT Visit: 1 Visit OT Treatments $Self Care/Home Management : 8-22 mins $Therapeutic Activity: 8-22 mins  [name], OTA Acute Rehabilitation Services  Office [phone]   [name] 07/15/2024, 1:10 PM

## 2024-07-15 NOTE — Progress Notes (Signed)
 [facility] Progress Note   Subjective:   Seen in room - mother at bedside. No new issues - no CP/dyspnea. Was dialyzed in recliner 11/10 - had some pain but made it through.  Objective Vitals:   07/15/24 0042 07/15/24 0400 07/15/24 0744 07/15/24 0837  BP: (!) 164/68 138/60 (!) 172/72 (!) 152/60  Pulse: 76 77 76   Resp: 16 16 18    Temp: 98.5 F (36.9 C) 98.6 F (37 C) 98.5 F (36.9 C)   TempSrc: Oral Oral Oral   SpO2: 96% 96% 95%   Weight:      Height:       Physical Exam General: Chronically ill appearing woman, NAD. Room air Heart: RRR, no murmur Lungs: CTA anteriorly Abdomen: soft, PEG in place Extremities: no LE edema Dialysis Access: R AVF +t/b  Additional Objective Labs: Basic Metabolic Panel: Recent Labs  Lab 07/09/24 0125 07/10/24 0811 07/11/24 0700 07/14/24 0213  NA 129* 133* 129* 128*  K 4.5 3.6 3.6 3.9  CL 84* 90* 89* 89*  CO2 27 24 26 26   GLUCOSE 104* 90 133* 108*  BUN 60* 23* 40* 59*  CREATININE 4.02* 2.68* 4.13* 4.72*  CALCIUM  10.2 10.1 9.9 10.1  PHOS 4.6  --  4.4 2.4*   Liver Function Tests: Recent Labs  Lab 07/11/24 0700  ALBUMIN  2.7*   CBC: Recent Labs  Lab 07/09/24 0125 07/11/24 0700 07/12/24 1327 07/14/24 0213  WBC 3.8* 4.5  --  5.9  HGB 7.5* 7.0* 7.2* 7.0*  HCT 23.6* 21.1* 22.2* 21.2*  MCV 96.7 94.2  --  95.9  PLT 168 157  --  160   Medications:   carvedilol   25 mg Per Tube BID WC   Chlorhexidine  Gluconate Cloth  6 each Topical Q0600   cinacalcet   30 mg Oral Q M,W,F   cloNIDine   0.3 mg Per Tube Once per day on Sunday Tuesday Thursday Saturday   cycloSPORINE   200 mg Per Tube QPM   cycloSPORINE   225 mg Per Tube Daily   darbepoetin (ARANESP ) injection - DIALYSIS  150 mcg Subcutaneous Q Thu-1800   famotidine   10 mg Per Tube QHS   feeding supplement (NEPRO CARB STEADY)  1,000 mL Per Tube Q24H   fiber supplement (BANATROL TF)  60 mL Per Tube TID   gabapentin   100 mg Per Tube Daily   Gerhardt's butt cream    Topical BID   lacosamide   100 mg Per Tube BID   liver oil-zinc  oxide   Topical TID   multivitamin with minerals  1 tablet Per Tube Daily   mycophenolate   500 mg Per Tube BID   mouth rinse  15 mL Mouth Rinse 4 times per day   oxidized cellulose  1 each Topical Once   predniSONE   15 mg Per Tube Q breakfast    Dialysis Orders MWF - NW 3:45hr, 400/A1.5, EDW 55.2kg, 2K/2Ca bath, AVF, no heparin  - Prev on Mircera 150mcg IV q 2 weeks - No VDRA   Assessment/Plan: Acute R intracranial hemorrhage: 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 Needs ongoing rehab. ESRD: Continue HD on MWF schedule - next HD tomorrow. HTN/volume: BP higher today, no LE edema, UF as tolerated. Off amlodipine , now on Coreg  25mg  BID. Still getting clonidine  0.3mg  on non-HD days - very odd. Change to clonidine  0.1mg  BID and follow. Anemia of ESRD: Hgb 7s, transfuse prn. Continue Aranesp  150mcg q Thursday - last dose 11/6 Secondary HPTH: CorrCa high, Phos low. Continue sensipar , not on VDRA.  Nutrition: Alb low, continue Nepro TF. Consider repeat swallowing study in future. 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 Hx T1D/gastroparesis Buttocks wound/?yeast: Using antifungal powder, improving. Dispo: Insurance declined LTAC, will be going to SNF. Tolerated HD in recliner on 11/7    Izetta Boehringer, PA-C 07/15/2024, 11:01 AM  [employer]

## 2024-07-15 NOTE — TOC Progression Note (Signed)
 Transition of Care [facility]) - Progression Note    Patient Details  Name: [name] [name] MRN: [id number] Date of Birth: [birth year]  Transition of [facility] CM/SW Contact  [name], [name] Phone Number: 07/15/2024, 1:49 PM  Clinical Narrative:   CSW contacted [facility], they only have a semiprivate room at this time. CSW met with patient's mother at bedside to update, she has a lot of concerns about not having a private room. CSW attempted to answer questions, and then spoke with Admissions at [facility] to review with the mother. Admissions spoke with the mother, they are unsure that they can meet her needs; will discuss and update CSW. CSW contacted CMA to request insurance authorization be started in the meantime. CSW to follow.    Expected Discharge Plan: Skilled Nursing Facility Barriers to Discharge: Continued Medical Work up, English As A Second Language Teacher               Expected Discharge Plan and Services In-house Referral: Clinical Social Work   Post Acute Care Choice: Skilled Nursing Facility Living arrangements for the past 2 months: Apartment                                       Social Drivers of Health (SDOH) Interventions SDOH Screenings   Food Insecurity: Patient Unable To Answer (05/20/2024)  Housing: Unknown (07/14/2024)  Transportation Needs: Patient Unable To Answer (05/20/2024)  Utilities: Patient Unable To Answer (05/20/2024)  Social Connections: Unknown (10/04/2022)   Received from [facility]  Stress: No Stress Concern Present (04/16/2024)   Received from [facility]  Tobacco Use: Medium Risk (06/18/2024)    Readmission Risk Interventions    05/20/2024    4:16 PM 03/31/2024   11:27 AM 11/17/2021   12:15 PM  Readmission Risk Prevention Plan  Transportation Screening Complete Complete Complete  Medication Review Oceanographer) Complete Complete Complete  PCP or Specialist appointment within 3-5 days of discharge Complete   Complete  HRI or Home Care Consult Complete Complete Complete  SW Recovery Care/Counseling Consult Complete Complete Complete  Palliative Care Screening Complete Not Applicable Not Applicable  Skilled Nursing Facility Complete Not Applicable Patient Refused

## 2024-07-15 NOTE — Plan of Care (Signed)
  Problem: Metabolic: Goal: Ability to maintain appropriate glucose levels will improve 07/15/2024 1837 by Danyle Boening, RN Outcome: Progressing 07/15/2024 1836 by Raenelle Brownie, RN Outcome: Progressing   Problem: Nutritional: Goal: Maintenance of adequate nutrition will improve 07/15/2024 1837 by Jiraiya Mcewan, RN Outcome: Progressing 07/15/2024 1836 by Raenelle Brownie, RN Outcome: Progressing   Problem: Skin Integrity: Goal: Risk for impaired skin integrity will decrease Outcome: Progressing   Problem: Coping: Goal: Will verbalize positive feelings about self 07/15/2024 1837 by Raenelle Brownie, RN Outcome: Progressing 07/15/2024 1836 by Anjali Manzella, RN Outcome: Progressing Goal: Will identify appropriate support needs 07/15/2024 1837 by Cohan Stipes, RN Outcome: Progressing 07/15/2024 1836 by Ledger Heindl, RN Outcome: Progressing   Problem: Health Behavior/Discharge Planning: Goal: Ability to manage health-related needs will improve Outcome: Progressing Goal: Goals will be collaboratively established with patient/family Outcome: Progressing   Problem: Self-Care: Goal: Ability to communicate needs accurately will improve 07/15/2024 1837 by Abdurahman Rugg, RN Outcome: Progressing 07/15/2024 1836 by Bayyinah Dukeman, RN Outcome: Progressing   Problem: Nutrition: Goal: Risk of aspiration will decrease 07/15/2024 1837 by Aldin Drees, RN Outcome: Progressing 07/15/2024 1836 by Janneth Krasner, RN Outcome: Progressing   Problem: Education: Goal: Knowledge of General Education information will improve Description: Including pain rating scale, medication(s)/side effects and non-pharmacologic comfort measures Outcome: Progressing   Problem: Clinical Measurements: Goal: Respiratory complications will improve Outcome: Progressing   Problem: Activity: Goal: Risk for activity intolerance will decrease Outcome: Progressing   Problem:  Coping: Goal: Level of anxiety will decrease Outcome: Progressing

## 2024-07-16 DIAGNOSIS — I619 Nontraumatic intracerebral hemorrhage, unspecified: Secondary | ICD-10-CM | POA: Diagnosis not present

## 2024-07-16 LAB — CBC
HCT: 21.7 % — ABNORMAL LOW (ref 36.0–46.0)
Hemoglobin: 7.1 g/dL — ABNORMAL LOW (ref 12.0–15.0)
MCH: 31.8 pg (ref 26.0–34.0)
MCHC: 32.7 g/dL (ref 30.0–36.0)
MCV: 97.3 fL (ref 80.0–100.0)
Platelets: 148 K/uL — ABNORMAL LOW (ref 150–400)
RBC: 2.23 MIL/uL — ABNORMAL LOW (ref 3.87–5.11)
RDW: 15 % (ref 11.5–15.5)
WBC: 4.1 K/uL (ref 4.0–10.5)
nRBC: 0 % (ref 0.0–0.2)

## 2024-07-16 LAB — BASIC METABOLIC PANEL WITH GFR
Anion gap: 16 — ABNORMAL HIGH (ref 5–15)
BUN: 48 mg/dL — ABNORMAL HIGH (ref 6–20)
CO2: 25 mmol/L (ref 22–32)
Calcium: 10 mg/dL (ref 8.9–10.3)
Chloride: 89 mmol/L — ABNORMAL LOW (ref 98–111)
Creatinine, Ser: 3.9 mg/dL — ABNORMAL HIGH (ref 0.44–1.00)
GFR, Estimated: 14 mL/min — ABNORMAL LOW (ref >60)
Glucose, Bld: 117 mg/dL — ABNORMAL HIGH (ref 70–99)
Potassium: 4.3 mmol/L (ref 3.5–5.1)
Sodium: 130 mmol/L — ABNORMAL LOW (ref 135–145)

## 2024-07-16 LAB — GLUCOSE, CAPILLARY
Glucose-Capillary: 113 mg/dL — ABNORMAL HIGH (ref 70–99)
Glucose-Capillary: 115 mg/dL — ABNORMAL HIGH (ref 70–99)
Glucose-Capillary: 116 mg/dL — ABNORMAL HIGH (ref 70–99)
Glucose-Capillary: 129 mg/dL — ABNORMAL HIGH (ref 70–99)
Glucose-Capillary: 144 mg/dL — ABNORMAL HIGH (ref 70–99)

## 2024-07-16 LAB — MAGNESIUM: Magnesium: 2.9 mg/dL — ABNORMAL HIGH (ref 1.7–2.4)

## 2024-07-16 LAB — PHOSPHORUS: Phosphorus: 2.9 mg/dL (ref 2.5–4.6)

## 2024-07-16 NOTE — Plan of Care (Signed)
  Problem: Education: Goal: Ability to describe self-care measures that may prevent or decrease complications (Diabetes Survival Skills Education) will improve Outcome: Progressing   Problem: Coping: Goal: Ability to adjust to condition or change in health will improve Outcome: Progressing   Problem: Health Behavior/Discharge Planning: Goal: Ability to identify and utilize available resources and services will improve Outcome: Progressing

## 2024-07-16 NOTE — Progress Notes (Signed)
 Pt's right chest cleaned with alcohol  and 3 staples removed with no complications. No bleeding, approximated edges.

## 2024-07-16 NOTE — Progress Notes (Signed)
 Physical Therapy Treatment Patient Details Name: [name] [name] MRN: [id number] DOB: December 24, 1980 Today's Date: 07/16/2024   History of Present Illness 43 yo female presents 05/19/24 for left gaze and L side weakness. 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 Intubated 9/15-9/23. EVD 9/15- 10/15; s/p shunt 10/15. S/p laparoscopy with intra-abdominal assistance ventricular-peritoneal shunt placement 10/15. 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 Pt reports increased fatigue due to recently returning from HD. Pt able to tolerate 2 standing trials and lateral steps towards HOB with up to min-mod A. Pt demonstrates short standing tolerance due to quick fatigue. Pt limited by poor activity tolerance, but demonstrates improved participation and command following. Pt continues to benefit from PT services to progress toward functional mobility goals.    If plan is discharge home, recommend the following: A lot of help with walking and/or transfers;A lot of help with bathing/dressing/bathroom;Assistance with cooking/housework;Direct supervision/assist for medications management;Direct supervision/assist for financial management;Assist for transportation;Help with stairs or ramp for entrance   Can travel by private vehicle     No  Equipment Recommendations  Hospital bed;Hoyer lift;Wheelchair (measurements PT);Wheelchair cushion (measurements PT);BSC/3in1    Recommendations for Other Services       Precautions / Restrictions Precautions Precautions: Fall Recall of Precautions/Restrictions: Impaired Precaution/Restrictions Comments: PEG Restrictions Weight Bearing Restrictions  Per Provider Order: No     Mobility  Bed Mobility Overal bed mobility: Needs Assistance Bed Mobility: Supine to Sit, Sit to Supine     Supine to sit: Mod assist Sit to supine: Contact guard assist   General bed mobility comments: assist for trunk elevation    Transfers Overall transfer level: Needs assistance Equipment used: Rolling walker (2 wheels) Transfers: Sit to/from Stand Sit to Stand: Min assist, Mod assist           General transfer comment: STS from EOB x2 with min A progressing to mod A with increased fatigue. Pt able to take lateral steps towards [facility] with min A for balance and RW management, but fatigues quickly    Ambulation/Gait               General Gait Details: unable due to fatigue   Stairs             Wheelchair Mobility     Tilt Bed    Modified Rankin (Stroke Patients Only) Modified Rankin (Stroke Patients Only) Pre-Morbid Rankin Score: Moderately severe disability Modified Rankin: Severe disability     Balance Overall balance assessment: Needs assistance Sitting-balance support: Bilateral upper extremity supported, Feet supported Sitting balance-Leahy Scale: Fair Sitting balance - Comments: CGA sitting EOB   Standing balance support: Reliant on assistive device for balance, Bilateral upper extremity supported, During functional activity Standing balance-Leahy Scale: Poor Standing balance comment: reliant on RW for support                            Communication Communication Communication: Impaired Factors Affecting Communication: Difficulty expressing self;Reduced clarity of speech  Cognition Arousal: Alert Behavior During Therapy: WFL for tasks assessed/performed   PT - Cognitive impairments: Awareness, Memory, Attention, Initiation, Sequencing, Safety/Judgement, Problem solving, Orientation  Following commands: Impaired Following commands impaired: Follows one step  commands with increased time    Cueing Cueing Techniques: Verbal cues, Gestural cues, Tactile cues, Visual cues  Exercises      General Comments        Pertinent Vitals/Pain Pain Assessment Pain Assessment: No/denies pain     PT Goals (current goals can now be found in the care plan section) Acute Rehab PT Goals Patient Stated Goal: unable to state goal PT Goal Formulation: Patient unable to participate in goal setting Time For Goal Achievement: 07/24/24 Progress towards PT goals: Progressing toward goals    Frequency    Min 2X/week       AM-PAC PT 6 Clicks Mobility   Outcome Measure  Help needed turning from your back to your side while in a flat bed without using bedrails?: A Little Help needed moving from lying on your back to sitting on the side of a flat bed without using bedrails?: A Little Help needed moving to and from a bed to a chair (including a wheelchair)?: A Lot Help needed standing up from a chair using your arms (e.g., wheelchair or bedside chair)?: A Lot Help needed to walk in hospital room?: Total Help needed climbing 3-5 steps with a railing? : Total 6 Click Score: 12    End of Session Equipment Utilized During Treatment: Gait belt Activity Tolerance: Patient limited by fatigue Patient left: with call bell/phone within reach;with family/visitor present;in bed;with nursing/sitter in room Nurse Communication: Mobility status PT Visit Diagnosis: Unsteadiness on feet (R26.81);Other abnormalities of gait and mobility (R26.89);Muscle weakness (generalized) (M62.81);Difficulty in walking, not elsewhere classified (R26.2);Other symptoms and signs involving the nervous system (R29.898)     Time: 8592-8572 PT Time Calculation (min) (ACUTE ONLY): 20 min  Charges:    $Therapeutic Activity: 8-22 mins PT General Charges $$ ACUTE PT VISIT: 1 Visit                     Darryle George, PTA Acute Rehabilitation Services Secure Chat Preferred   [phone]    Darryle George 07/16/2024, 2:40 PM

## 2024-07-16 NOTE — Progress Notes (Signed)
 Pt. Came in on bed, awake and responsive. Consent signed and on file, Pt started with no complaints.  UF removed: Tx duration:3.5 hours  HD completed with no complaints. Pressure dressing applied. Endorsed to floor nurse, Pt. Transported to room.   Ericha Whittingham Rubi B. Camree Wigington, RN Kidney Dialysis Unit

## 2024-07-16 NOTE — Progress Notes (Signed)
 PROGRESS NOTE  [name] [name] FMW:[id number] DOB: 1981-01-01 DOA: 05/19/2024 PCP: [name]   LOS: 58 days   Brief narrative:  Patient is a  43-yrs-old female with past medical history of hypertension, CVA, type 1 diabetes, gastroparesis, kidney and pancreatic transplant 2014 with subsequent failure of renal transplant now on end-stage renal dialysis, anxiety, narcotic abuse presented to the hospital from dialysis unit on 05/19/2024 as code stroke.  Patient was noted to have hemorrhagic stroke.  Neurology neurosurgery in critical care were consulted.  EVD was placed on the left side and was initially admitted to neuro ICU.  She was also intubated and had prolonged intubation.  Subsequently patient was extubated on 05/27/2024.  Important events:  9/15 admitted with ICH plus IVH, EVD was placed for hydrocephalus, intubated 9/22 palliative care meeting.  Family is insistent that they want full scope of care 9/23 Extubated 10/1 -developed fevers pancultures drawn and broad-spectrum antibiotics started 10/2 became afebrile after starting antibiotics, did not tolerate raising EVD 10/6 EVD was raised from 10 to 30 cm water , tolerating well, remained afebrile 10/7 EVD is at 30 cm water , CT scan will be done tomorrow morning, no overnight issues 10/8 she became somnolent, CT head showed increasing hydrocephalus, EVD was titrated down to 10 cm of water  with improvement in mental status 10/15 VP shunt placement 10/17 transferred to progressive unit with [name]   Assessment/Plan: Principal Problem:   Hemorrhagic stroke [facility]) Active Problems:   Moderate protein-calorie malnutrition   Pressure injury of skin   Nontraumatic subcortical hemorrhage of right cerebral hemisphere [facility])  Acute right parietotemporal intraparenchymal hemorrhage / Intraventricular hemorrhage: Uncontrolled hypertension: Obstructive hydrocephalus s/p EVD changed to VP shunt: Seizure disorder S/p VP shunt  placement on 06/18/2024 by Dr. Mavis.  Continue Vimpat .  Neurosurgery following.   ESRD on dialysis: History of failed renal transplant: Secondary hyperparathyroidism: Nephrology on board.  Continue CellCept  and prednisone .  On Monday Wednesday Friday hemodialysis schedule.  TOC working on outpatient hemodialysis set up  Acute bronchitis: Resolved Intermittent low-grade fever:  Thought to be secondary to intracranial hemorrhage. Previous blood cultures showed contamination.  S/p antibiotics for respiratory cultures positive for Serratia and Klebsiella.  Repeat culture negative.  Afebrile   History of pancreatic transplantation:  Continue cyclosporine , mycophenolate , and prednisone .   Hypertension:  Currently on Coreg , amlodipine  and clonidine  on nondialysis days.  Latest blood pressure at 150/63   Anemia of chronic disease:  Hemoglobin of 7.1 at this time.Transfuse if Hb below 7.0   Type 1 diabetes with vasculopathy/gastroparesis:  Not on long-term insulin  anymore due to pancreatic transplant.  On sliding scale at this time.   Moderate protein calorie malnutrition/dysphagia:  On PEG tube feeding.   Stage I sacral pressure ulcer present on admission: Continue wound care   Debility/deconditioning/disposition/goals of care:  Palliative care had seen the patient during hospitalization.  Family has insisted full code.   Palliative care has signed off 10/17.  PT OT has recommended CIR which was declined and LTAC was pursued but likely at this time.  TOC on board for placement.   DVT prophylaxis: SCDs Start: 05/19/24 1724   Disposition: Uncertain at this time  Status is: Inpatient Remains inpatient appropriate because: Uncertain disposition    Code Status:     Code Status: Full Code  Family Communication: Spoke with the patient's mother at bedside.  Consultants: Nephrology Neurosurgery  Procedures: Ventriculoperitoneal shunt  placement Hemodialysis  Anti-infectives:  None currently  Anti-infectives (From admission, onward)    Start  Dose/Rate Route Frequency Ordered Stop   07/08/24 1530  fluconazole  (DIFLUCAN ) tablet 150 mg        150 mg Oral  Once 07/08/24 1437 07/08/24 1544   06/18/24 2030  ceFAZolin  (ANCEF ) IVPB 2g/100 mL premix  Status:  Discontinued        2 g 200 mL/hr over 30 Minutes Intravenous Every 8 hours 06/18/24 1454 06/19/24 1608   06/18/24 0600  ceFAZolin  (ANCEF ) IVPB 2g/100 mL premix        2 g 200 mL/hr over 30 Minutes Intravenous On call to O.R. 06/15/24 1152 06/18/24 1230   06/06/24 1400  piperacillin -tazobactam (ZOSYN ) IVPB 2.25 g        2.25 g 100 mL/hr over 30 Minutes Intravenous Every 8 hours 06/06/24 1017 06/09/24 0538   06/06/24 1200  vancomycin  (VANCOREADY) IVPB 500 mg/100 mL        500 mg 100 mL/hr over 60 Minutes Intravenous Every M-W-F (Hemodialysis) 06/04/24 1745 06/06/24 1900   06/04/24 1845  vancomycin  (VANCOCIN ) IVPB 1000 mg/200 mL premix        1,000 mg 200 mL/hr over 60 Minutes Intravenous  Once 06/04/24 1745 06/04/24 1915   06/04/24 1830  ceFEPIme  (MAXIPIME ) 2 g in sodium chloride  0.9 % 100 mL IVPB  Status:  Discontinued        2 g 200 mL/hr over 30 Minutes Intravenous Every M-W-F (Hemodialysis) 06/04/24 1743 06/06/24 1017   05/27/24 1015  cefTRIAXone  (ROCEPHIN ) 2 g in sodium chloride  0.9 % 100 mL IVPB        2 g 200 mL/hr over 30 Minutes Intravenous Every 24 hours 05/27/24 0918 05/29/24 1014   05/23/24 1530  ceFEPIme  (MAXIPIME ) 1 g in sodium chloride  0.9 % 100 mL IVPB  Status:  Discontinued        1 g 200 mL/hr over 30 Minutes Intravenous Every 24 hours 05/23/24 1433 05/27/24 0918   05/22/24 2200  Ampicillin -Sulbactam (UNASYN ) 3 g in sodium chloride  0.9 % 100 mL IVPB  Status:  Discontinued        3 g 200 mL/hr over 30 Minutes Intravenous Every 24 hours 05/22/24 0834 05/23/24 1433   05/22/24 0930  Ampicillin -Sulbactam (UNASYN ) 3 g in sodium chloride  0.9 % 100  mL IVPB        3 g 200 mL/hr over 30 Minutes Intravenous  Once 05/22/24 0834 05/22/24 1900        Subjective: Today, patient was seen and examined at bedside.  Patient complains of mild cough denies any nausea vomiting fever chills or fever.  Seen after hemodialysis.  Objective: Vitals:   07/16/24 1227 07/16/24 1229  BP: (!) 137/54 (!) 150/63  Pulse: 77 79  Resp: 15 17  Temp:  97.8 F (36.6 C)  SpO2: 100% 100%    Intake/Output Summary (Last 24 hours) at 07/16/2024 1309 Last data filed at 07/16/2024 1229 Gross per 24 hour  Intake --  Output 2000 ml  Net -2000 ml   Filed Weights   07/15/24 0734 07/16/24 0837 07/16/24 1229  Weight: 56 kg 56.1 kg 53.1 kg   Body mass index is 20.74 kg/m.   Physical Exam: ?? GENERAL: Patient is alert awake and oriented. Not in obvious distress.  Appears chronically ill, Communicative. HENT: No scleral pallor or icterus. Pupils equally reactive to light. Oral mucosa is moist NECK: is supple, no gross swelling noted. CHEST: Clear to auscultation. No crackles or wheezes.  Diminished breath sounds bilaterally. CVS: S1 and S2 heard, no murmur. Regular rate  and rhythm.  ABDOMEN: Soft, non-tender, bowel sounds are present. EXTREMITIES: lower extremity trace edema.  Right upper extremity AV fistula. CNS: Cranial nerves are intact. No focal motor deficits. SKIN: warm and dry without rashes.  Data Review: I have personally reviewed the following laboratory data and studies,  CBC: Recent Labs  Lab 07/11/24 0700 07/12/24 1327 07/14/24 0213 07/16/24 0114  WBC 4.5  --  5.9 4.1  HGB 7.0* 7.2* 7.0* 7.1*  HCT 21.1* 22.2* 21.2* 21.7*  MCV 94.2  --  95.9 97.3  PLT 157  --  160 148*   Basic Metabolic Panel: Recent Labs  Lab 07/10/24 0811 07/11/24 0700 07/14/24 0213 07/16/24 0114  NA 133* 129* 128* 130*  K 3.6 3.6 3.9 4.3  CL 90* 89* 89* 89*  CO2 24 26 26 25   GLUCOSE 90 133* 108* 117*  BUN 23* 40* 59* 48*  CREATININE 2.68* 4.13* 4.72*  3.90*  CALCIUM  10.1 9.9 10.1 10.0  MG  --   --  3.0* 2.9*  PHOS  --  4.4 2.4* 2.9   Liver Function Tests: Recent Labs  Lab 07/11/24 0700  ALBUMIN  2.7*   No results for input(s): LIPASE, AMYLASE in the last 168 hours. No results for input(s): AMMONIA in the last 168 hours. Cardiac Enzymes: No results for input(s): CKTOTAL, CKMB, CKMBINDEX, TROPONINI in the last 168 hours. BNP (last 3 results) Recent Labs    02/14/24 0949  BNP 1,486.6*    ProBNP (last 3 results) No results for input(s): PROBNP in the last 8760 hours.  CBG: Recent Labs  Lab 07/15/24 1531 07/15/24 2040 07/15/24 2347 07/16/24 0426 07/16/24 0721  GLUCAP 202* 141* 125* 115* 116*   No results found for this or any previous visit (from the past 240 hours).   Studies: No results found.    Vernal Alstrom, MD  [name] 07/16/2024  If 7PM-7AM, please contact night-coverage

## 2024-07-16 NOTE — Progress Notes (Signed)
 [facility] Progress Note   Subjective:   Seen in [facility], starting HD now, 1.5L UFG planned. No CP/dyspnea.  Objective Vitals:   07/15/24 2040 07/15/24 2144 07/15/24 2349 07/16/24 0400  BP: (!) 156/62 (!) 156/62 (!) 166/65 (!) 152/60  Pulse: 75  72 75  Resp: 18  18 16   Temp: 97.9 F (36.6 C)  97.9 F (36.6 C) 97.9 F (36.6 C)  TempSrc: Oral  Oral Oral  SpO2: 96%  97% 96%  Weight:      Height:       Physical Exam General: Chronically ill appearing woman, NAD. Room air Heart: RRR, no murmur Lungs: CTA anteriorly Abdomen: soft, PEG in place Extremities: no LE edema Dialysis Access: R AVF +t/b   Additional Objective Labs: Basic Metabolic Panel: Recent Labs  Lab 07/11/24 0700 07/14/24 0213 07/16/24 0114  NA 129* 128* 130*  K 3.6 3.9 4.3  CL 89* 89* 89*  CO2 26 26 25   GLUCOSE 133* 108* 117*  BUN 40* 59* 48*  CREATININE 4.13* 4.72* 3.90*  CALCIUM  9.9 10.1 10.0  PHOS 4.4 2.4* 2.9   Liver Function Tests: Recent Labs  Lab 07/11/24 0700  ALBUMIN  2.7*   CBC: Recent Labs  Lab 07/11/24 0700 07/12/24 1327 07/14/24 0213 07/16/24 0114  WBC 4.5  --  5.9 4.1  HGB 7.0* 7.2* 7.0* 7.1*  HCT 21.1* 22.2* 21.2* 21.7*  MCV 94.2  --  95.9 97.3  PLT 157  --  160 148*   Medications:   carvedilol   25 mg Per Tube BID WC   Chlorhexidine  Gluconate Cloth  6 each Topical Q0600   cinacalcet   30 mg Oral Q M,W,F   cloNIDine   0.1 mg Oral BID   cycloSPORINE   200 mg Per Tube QPM   cycloSPORINE   225 mg Per Tube Daily   darbepoetin (ARANESP ) injection - DIALYSIS  150 mcg Subcutaneous Q Thu-1800   famotidine   10 mg Per Tube QHS   feeding supplement (NEPRO CARB STEADY)  1,000 mL Per Tube Q24H   fiber supplement (BANATROL TF)  60 mL Per Tube TID   gabapentin   100 mg Per Tube Daily   Gerhardt's butt cream   Topical BID   lacosamide   100 mg Per Tube BID   liver oil-zinc  oxide   Topical TID   multivitamin with minerals  1 tablet Per Tube Daily   mycophenolate   500 mg  Per Tube BID   mouth rinse  15 mL Mouth Rinse 4 times per day   oxidized cellulose  1 each Topical Once   predniSONE   15 mg Per Tube Q breakfast    Dialysis Orders MWF - NW 3:45hr, 400/A1.5, EDW 55.2kg, 2K/2Ca bath, AVF, no heparin  - Prev on Mircera 150mcg IV q 2 weeks - No VDRA   Assessment/Plan: Acute R intracranial hemorrhage: 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 Needs ongoing rehab. ESRD: Continue HD on MWF schedule - HD now, 1.5L UFG. HTN/volume: BP higher, no LE edema, UF as tolerated. Off amlodipine , now on Coreg  25mg  BID. Changed clonidine  from 0.3mg  every other day to 0.1mg  BID - follow. Anemia of ESRD: Hgb 7s, transfuse prn. Continue Aranesp  150mcg q Thursday - last dose 11/6 Secondary HPTH: CorrCa high, Phos low. Continue sensipar , not on VDRA. Nutrition: Alb low, continue Nepro TF. Consider repeat swallowing study in future. 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 Hx T1D/gastroparesis Buttocks wound/?yeast: Using antifungal powder, improving. Dispo: Insurance declined LTAC, will be going to SNF. Tolerated  HD in recliner on 11/7   [name] [name], [name] [name] 07/16/2024, 8:40 AM  [employer]

## 2024-07-16 NOTE — Progress Notes (Signed)
 Subjective: The patient is alert and pleasant.  She is in no apparent distress.  Objective: Vital signs in last 24 hours: Temp:  [97.7 F (36.5 C)-98.8 F (37.1 C)] 97.7 F (36.5 C) (11/12 0837) Pulse Rate:  [72-78] 77 (11/12 0849) Resp:  [11-18] 11 (11/12 0849) BP: (140-166)/(60-79) 166/66 (11/12 0849) SpO2:  [96 %-100 %] 100 % (11/12 0849) Weight:  [56.1 kg] 56.1 kg (11/12 0837) Estimated body mass index is 21.91 kg/m as calculated from the following:   Height as of this encounter: 5' 3 (1.6 m).   Weight as of this encounter: 56.1 kg.   Intake/Output from previous day: No intake/output data recorded. Intake/Output this shift: No intake/output data recorded.  Physical exam the patient is alert and oriented.  Lab Results: Recent Labs    07/14/24 0213 07/16/24 0114  WBC 5.9 4.1  HGB 7.0* 7.1*  HCT 21.2* 21.7*  PLT 160 148*   BMET Recent Labs    07/14/24 0213 07/16/24 0114  NA 128* 130*  K 3.9 4.3  CL 89* 89*  CO2 26 25  GLUCOSE 108* 117*  BUN 59* 48*  CREATININE 4.72* 3.90*  CALCIUM  10.1 10.0    Studies/Results: No results found.  Assessment/Plan: Status post ventriculoperitoneal shunt: The patient is doing well.  LOS: 58 days     [name] 07/16/2024, 9:06 AM     Patient ID: [name] [name], female   DOB: 09-28-1980, 43 y.o.   MRN: [id number]

## 2024-07-16 NOTE — Plan of Care (Signed)
 Problem: Education: Goal: Ability to describe self-care measures that may prevent or decrease complications (Diabetes Survival Skills Education) will improve Outcome: Progressing Goal: Individualized Educational Video(s) Outcome: Progressing   Problem: Coping: Goal: Ability to adjust to condition or change in health will improve Outcome: Progressing   Problem: Fluid Volume: Goal: Ability to maintain a balanced intake and output will improve Outcome: Progressing   Problem: Health Behavior/Discharge Planning: Goal: Ability to identify and utilize available resources and services will improve Outcome: Progressing Goal: Ability to manage health-related needs will improve Outcome: Progressing   Problem: Metabolic: Goal: Ability to maintain appropriate glucose levels will improve Outcome: Progressing   Problem: Nutritional: Goal: Maintenance of adequate nutrition will improve Outcome: Progressing Goal: Progress toward achieving an optimal weight will improve Outcome: Progressing   Problem: Skin Integrity: Goal: Risk for impaired skin integrity will decrease Outcome: Progressing   Problem: Tissue Perfusion: Goal: Adequacy of tissue perfusion will improve Outcome: Progressing   Problem: Education: Goal: Knowledge of disease or condition will improve Outcome: Progressing Goal: Knowledge of secondary prevention will improve (MUST DOCUMENT ALL) Outcome: Progressing Goal: Knowledge of patient specific risk factors will improve (DELETE if not current risk factor) Outcome: Progressing   Problem: Intracerebral Hemorrhage Tissue Perfusion: Goal: Complications of Intracerebral Hemorrhage will be minimized Outcome: Progressing   Problem: Coping: Goal: Will verbalize positive feelings about self Outcome: Progressing Goal: Will identify appropriate support needs Outcome: Progressing   Problem: Health Behavior/Discharge Planning: Goal: Ability to manage health-related needs will  improve Outcome: Progressing Goal: Goals will be collaboratively established with patient/family Outcome: Progressing   Problem: Self-Care: Goal: Ability to participate in self-care as condition permits will improve Outcome: Progressing Goal: Verbalization of feelings and concerns over difficulty with self-care will improve Outcome: Progressing Goal: Ability to communicate needs accurately will improve Outcome: Progressing   Problem: Nutrition: Goal: Risk of aspiration will decrease Outcome: Progressing Goal: Dietary intake will improve Outcome: Progressing   Problem: Education: Goal: Knowledge of General Education information will improve Description: Including pain rating scale, medication(s)/side effects and non-pharmacologic comfort measures Outcome: Progressing   Problem: Health Behavior/Discharge Planning: Goal: Ability to manage health-related needs will improve Outcome: Progressing   Problem: Clinical Measurements: Goal: Ability to maintain clinical measurements within normal limits will improve Outcome: Progressing Goal: Will remain free from infection Outcome: Progressing Goal: Diagnostic test results will improve Outcome: Progressing Goal: Respiratory complications will improve Outcome: Progressing Goal: Cardiovascular complication will be avoided Outcome: Progressing   Problem: Activity: Goal: Risk for activity intolerance will decrease Outcome: Progressing   Problem: Nutrition: Goal: Adequate nutrition will be maintained Outcome: Progressing   Problem: Coping: Goal: Level of anxiety will decrease Outcome: Progressing   Problem: Elimination: Goal: Will not experience complications related to bowel motility Outcome: Progressing   Problem: Pain Managment: Goal: General experience of comfort will improve and/or be controlled Outcome: Progressing   Problem: Safety: Goal: Ability to remain free from injury will improve Outcome: Progressing    Problem: Skin Integrity: Goal: Risk for impaired skin integrity will decrease Outcome: Progressing   Problem: Education: Goal: Knowledge of the prescribed therapeutic regimen will improve Outcome: Progressing   Problem: Clinical Measurements: Goal: Usual level of consciousness will be regained or maintained. Outcome: Progressing Goal: Neurologic status will improve Outcome: Progressing Goal: Ability to maintain intracranial pressure will improve Outcome: Progressing   Problem: Skin Integrity: Goal: Demonstration of wound healing without infection will improve Outcome: Progressing   Problem: Education: Goal: Knowledge of disease and its progression will improve Outcome: Progressing  Goal: Individualized Educational Video(s) Outcome: Progressing   Problem: Fluid Volume: Goal: Compliance with measures to maintain balanced fluid volume will improve Outcome: Progressing

## 2024-07-16 NOTE — Progress Notes (Signed)
   07/16/24 1229  Vitals  Temp 97.8 F (36.6 C)  Pulse Rate 79  Resp 17  BP (!) 150/63  SpO2 100 %  O2 Device Room Air  Weight 53.1 kg  Type of Weight Post-Dialysis  Oxygen  Therapy  Patient Activity (if Appropriate) In bed  Pulse Oximetry Type Continuous  Oximetry Probe Site Changed No  Post Treatment  Dialyzer Clearance Lightly streaked  Liters Processed 84  Fluid Removed (mL) 2000 mL  Tolerated HD Treatment Yes  Post-Hemodialysis Comments Pt. tolerated tx well  AVG/AVF Arterial Site Held (minutes) 10 minutes  AVG/AVF Venous Site Held (minutes) 10 minutes

## 2024-07-17 DIAGNOSIS — I619 Nontraumatic intracerebral hemorrhage, unspecified: Secondary | ICD-10-CM | POA: Diagnosis not present

## 2024-07-17 LAB — GLUCOSE, CAPILLARY
Glucose-Capillary: 106 mg/dL — ABNORMAL HIGH (ref 70–99)
Glucose-Capillary: 106 mg/dL — ABNORMAL HIGH (ref 70–99)

## 2024-07-17 MED ORDER — LOSARTAN POTASSIUM 25 MG PO TABS
25.0000 mg | ORAL_TABLET | Freq: Every day | ORAL | Status: DC
  Administered 2024-07-17 – 2024-08-08 (×21): 25 mg
  Filled 2024-07-17 (×22): qty 1

## 2024-07-17 MED ORDER — DARBEPOETIN ALFA 200 MCG/0.4ML IJ SOSY
200.0000 ug | PREFILLED_SYRINGE | INTRAMUSCULAR | Status: DC
  Administered 2024-07-17 – 2024-08-21 (×6): 200 ug via SUBCUTANEOUS
  Filled 2024-07-17 (×6): qty 0.4

## 2024-07-17 NOTE — TOC Progression Note (Signed)
 Transition of Care [facility]) - Progression Note    Patient Details  Name: [name] [name] MRN: [id number] Date of Birth: 10/24/1980  Transition of Care [facility]) CM/SW Contact  [name], [name] Phone Number: 07/17/2024, 3:08 PM  Clinical Narrative:   Patient has been denied for SNF. CSW updated [facility], they do not have any LTC beds available. CSW met with patient's mother, Almetta, at bedside to provide update on insurance denial. Mother frustrated and doesn't understand why the insurance wouldn't approve SNF for her daughter, agreeable to appeal. Appointment of representative form completed, and documentation compiled for fast appeal. Appeal sent in to [facility] to review, they have 72 hours to respond.  CSW also spoke with mother about questions regarding the patient's benefits for next year, and assisted her with reviewing mail that the patient has received about her updated benefits. Mother to bring in additional paperwork for CSW to assist her with understanding.   CSW also spoke with [facility], where patient was at previously. They still are not able to take admissions, but they did file for LTC Medicaid for patient and she was approved. No LTC bed available for patient anywhere at this time. CSW to follow.    Expected Discharge Plan: Skilled Nursing Facility Barriers to Discharge: Continued Medical Work up, English As A Second Language Teacher               Expected Discharge Plan and Services In-house Referral: Clinical Social Work   Post Acute Care Choice: Skilled Nursing Facility Living arrangements for the past 2 months: Apartment                                       Social Drivers of Health (SDOH) Interventions SDOH Screenings   Food Insecurity: Patient Unable To Answer (05/20/2024)  Housing: Unknown (07/14/2024)  Transportation Needs: Patient Unable To Answer (05/20/2024)  Utilities: Patient Unable To Answer (05/20/2024)  Social Connections: Unknown (10/04/2022)    Received from [facility]  Stress: No Stress Concern Present (04/16/2024)   Received from [facility]  Tobacco Use: Medium Risk (06/18/2024)    Readmission Risk Interventions    05/20/2024    4:16 PM 03/31/2024   11:27 AM 11/17/2021   12:15 PM  Readmission Risk Prevention Plan  Transportation Screening Complete Complete Complete  Medication Review Oceanographer) Complete Complete Complete  PCP or Specialist appointment within 3-5 days of discharge Complete  Complete  HRI or Home Care Consult Complete Complete Complete  SW Recovery Care/Counseling Consult Complete Complete Complete  Palliative Care Screening Complete Not Applicable Not Applicable  Skilled Nursing Facility Complete Not Applicable Patient Refused

## 2024-07-17 NOTE — TOC Progression Note (Signed)
 Transition of Care [facility]) - Progression Note    Patient Details  Name: [name] [name] MRN: [id number] Date of Birth: [birth year]  Transition of Care [facility]) CM/SW Contact  [name], [name] Phone Number: 07/17/2024, 3:07 PM  Clinical Narrative:   Patient's insurance request went to peer to peer review. Information sent to MD to complete. CSW to follow.    Expected Discharge Plan: Skilled Nursing Facility Barriers to Discharge: Continued Medical Work up, English As A Second Language Teacher               Expected Discharge Plan and Services In-house Referral: Clinical Social Work   Post Acute Care Choice: Skilled Nursing Facility Living arrangements for the past 2 months: Apartment                                       Social Drivers of Health (SDOH) Interventions SDOH Screenings   Food Insecurity: Patient Unable To Answer (05/20/2024)  Housing: Unknown (07/14/2024)  Transportation Needs: Patient Unable To Answer (05/20/2024)  Utilities: Patient Unable To Answer (05/20/2024)  Social Connections: Unknown (10/04/2022)   Received from [facility]  Stress: No Stress Concern Present (04/16/2024)   Received from [facility]  Tobacco Use: Medium Risk (06/18/2024)    Readmission Risk Interventions    05/20/2024    4:16 PM 03/31/2024   11:27 AM 11/17/2021   12:15 PM  Readmission Risk Prevention Plan  Transportation Screening Complete Complete Complete  Medication Review Oceanographer) Complete Complete Complete  PCP or Specialist appointment within 3-5 days of discharge Complete  Complete  HRI or Home Care Consult Complete Complete Complete  SW Recovery Care/Counseling Consult Complete Complete Complete  Palliative Care Screening Complete Not Applicable Not Applicable  Skilled Nursing Facility Complete Not Applicable Patient Refused

## 2024-07-17 NOTE — Progress Notes (Signed)
 Fast Track Appeal number provided [phone] Fax# [phone]

## 2024-07-17 NOTE — Progress Notes (Signed)
 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  Patient was noted to have hemorrhagic stroke.  Neurology neurosurgery in critical care were consulted.  EVD was placed on the left side and was initially admitted to neuro ICU.  She was also intubated and had prolonged intubation.  Subsequently patient was extubated on 05/27/2024.  Important events:  9/15 admitted with ICH plus IVH, EVD was placed for hydrocephalus, intubated 9/22 palliative care meeting.  Family is insistent that they want full scope of care 9/23 Extubated 10/1 -developed fevers pancultures drawn and broad-spectrum antibiotics started 10/2 became afebrile after starting antibiotics, did not tolerate raising EVD 10/6 EVD was raised from 10 to 30 cm water , tolerating well, remained afebrile 10/7 EVD is at 30 cm water , CT scan will be done tomorrow morning, no overnight issues 10/8 she became somnolent, CT head showed increasing hydrocephalus, EVD was titrated down to 10 cm of water  with improvement in mental status 10/15 VP shunt placement 10/17 transferred to progressive unit with [name]   Assessment/Plan: Principal Problem:   Hemorrhagic stroke [facility]) Active Problems:   Moderate protein-calorie malnutrition   Pressure injury of skin   Nontraumatic subcortical hemorrhage of right cerebral hemisphere [facility])  Acute right parietotemporal intraparenchymal hemorrhage / Intraventricular hemorrhage: Uncontrolled hypertension: Obstructive hydrocephalus s/p EVD changed to VP shunt: Seizure disorder S/p VP shunt  placement on 06/18/2024 by Dr. Mavis.  Continue Vimpat .  Neurosurgery intermittently following.   ESRD on dialysis: History of failed renal transplant: Secondary hyperparathyroidism: Nephrology on board.  Continue CellCept  and prednisone .  On Monday Wednesday Friday hemodialysis schedule.  TOC working on outpatient hemodialysis set up  Acute bronchitis: Resolved Intermittent low-grade fever:  Thought to be secondary to intracranial hemorrhage. Previous blood cultures showed contamination.  S/p antibiotics for respiratory cultures positive for Serratia and Klebsiella.  Repeat culture negative.  Afebrile.  Temperature max of 98.8 F.   History of pancreatic/kidney transplantation:  Continue cyclosporine , mycophenolate , and prednisone .   Hypertension:  Currently on Coreg , amlodipine  and clonidine  on nondialysis days.  Latest blood pressure at 166/69   Anemia of chronic disease:  Latest hemoglobin of 7.1 .Transfuse if Hb below 7.0   Type 1 diabetes with vasculopathy/gastroparesis:  Not on long-term insulin  anymore due to pancreatic transplant.  On sliding scale at this time.   Moderate protein calorie malnutrition/dysphagia:  On PEG tube feeding.   Stage I sacral pressure ulcer present on admission: Continue wound care   Debility/deconditioning/disposition/goals of care:  Palliative care had seen the patient during hospitalization.  Family has insisted full code.   Palliative care has signed off 10/17.  Disposition pending.  Insurance has declined skilled nursing facility as per  DVT prophylaxis: SCDs Start: 05/19/24 1724   Disposition: Uncertain at this time  Status is: Inpatient Remains inpatient appropriate because: Uncertain disposition,    Code Status:     Code Status: Full Code  Family Communication: Spoke with the patient's mother at bedside.  Consultants: Nephrology Neurosurgery  Procedures: Ventriculoperitoneal shunt  placement Hemodialysis  Anti-infectives:  None currently  Anti-infectives (From admission, onward)    Start     Dose/Rate Route Frequency  Ordered Stop   07/08/24 1530  fluconazole  (DIFLUCAN ) tablet 150 mg        150 mg Oral  Once 07/08/24 1437 07/08/24 1544   06/18/24 2030  ceFAZolin  (ANCEF ) IVPB 2g/100 mL premix  Status:  Discontinued        2 g 200 mL/hr over 30 Minutes Intravenous Every 8 hours 06/18/24 1454 06/19/24 1608   06/18/24 0600  ceFAZolin  (ANCEF ) IVPB 2g/100 mL premix        2 g 200 mL/hr over 30 Minutes Intravenous On call to O.R. 06/15/24 1152 06/18/24 1230   06/06/24 1400  piperacillin -tazobactam (ZOSYN ) IVPB 2.25 g        2.25 g 100 mL/hr over 30 Minutes Intravenous Every 8 hours 06/06/24 1017 06/09/24 0538   06/06/24 1200  vancomycin  (VANCOREADY) IVPB 500 mg/100 mL        500 mg 100 mL/hr over 60 Minutes Intravenous Every M-W-F (Hemodialysis) 06/04/24 1745 06/06/24 1900   06/04/24 1845  vancomycin  (VANCOCIN ) IVPB 1000 mg/200 mL premix        1,000 mg 200 mL/hr over 60 Minutes Intravenous  Once 06/04/24 1745 06/04/24 1915   06/04/24 1830  ceFEPIme  (MAXIPIME ) 2 g in sodium chloride  0.9 % 100 mL IVPB  Status:  Discontinued        2 g 200 mL/hr over 30 Minutes Intravenous Every M-W-F (Hemodialysis) 06/04/24 1743 06/06/24 1017   05/27/24 1015  cefTRIAXone  (ROCEPHIN ) 2 g in sodium chloride  0.9 % 100 mL IVPB        2 g 200 mL/hr over 30 Minutes Intravenous Every 24 hours 05/27/24 0918 05/29/24 1014   05/23/24 1530  ceFEPIme  (MAXIPIME ) 1 g in sodium chloride  0.9 % 100 mL IVPB  Status:  Discontinued        1 g 200 mL/hr over 30 Minutes Intravenous Every 24 hours 05/23/24 1433 05/27/24 0918   05/22/24 2200  Ampicillin -Sulbactam (UNASYN ) 3 g in sodium chloride  0.9 % 100 mL IVPB  Status:  Discontinued        3 g 200 mL/hr over 30 Minutes Intravenous Every 24 hours 05/22/24 0834 05/23/24 1433   05/22/24 0930  Ampicillin -Sulbactam (UNASYN ) 3 g in sodium chloride  0.9 % 100  mL IVPB        3 g 200 mL/hr over 30 Minutes Intravenous  Once 05/22/24 0834 05/22/24 1900       Subjective: Today, patient was seen and examined at bedside.  Has a mild cough.  Denies any nausea vomiting.  Patient's mother at bedside.  Objective: Vitals:   07/17/24 0409 07/17/24 0839  BP: (!) 158/80 (!) 166/69  Pulse: 76 79  Resp:  18  Temp:  98.5 F (36.9 C)  SpO2:  (P) 97%    Intake/Output Summary (Last 24 hours) at 07/17/2024 1029 Last data filed at 07/16/2024 1229 Gross per 24 hour  Intake --  Output 2000 ml  Net -2000 ml   Filed Weights   07/16/24 0837 07/16/24 1229 07/17/24 0459  Weight: 56.1 kg 53.1 kg 56.2 kg   Body mass index is 21.95 kg/m.   Physical Exam:  GENERAL: Patient is alert awake and oriented. Not in obvious distress.  Appears chronically ill, Communicative. HENT: No scleral pallor or icterus. Pupils equally reactive to light. Oral mucosa is moist NECK: is supple, no gross swelling noted. CHEST: Clear to auscultation.  Decreased breath sounds bilaterally CVS: S1 and S2 heard, no murmur. Regular rate and rhythm.  ABDOMEN: Soft, non-tender, bowel sounds are present.  PEG tube in place. EXTREMITIES: lower extremity trace edema.  Right upper extremity AV fistula. CNS: Cranial nerves are intact. No focal motor deficits. SKIN: warm and dry without rashes.  Data Review: I have personally reviewed the following laboratory data and studies,  CBC: Recent Labs  Lab 07/11/24 0700 07/12/24 1327 07/14/24 0213 07/16/24 0114  WBC 4.5  --  5.9 4.1  HGB 7.0* 7.2* 7.0* 7.1*  HCT 21.1* 22.2* 21.2* 21.7*  MCV 94.2  --  95.9 97.3  PLT 157  --  160 148*   Basic Metabolic Panel: Recent Labs  Lab 07/11/24 0700 07/14/24 0213 07/16/24 0114  NA 129* 128* 130*  K 3.6 3.9 4.3  CL 89* 89* 89*  CO2 26 26 25   GLUCOSE 133* 108* 117*  BUN 40* 59* 48*  CREATININE 4.13* 4.72* 3.90*  CALCIUM  9.9 10.1 10.0  MG  --  3.0* 2.9*  PHOS 4.4 2.4* 2.9   Liver  Function Tests: Recent Labs  Lab 07/11/24 0700  ALBUMIN  2.7*   No results for input(s): LIPASE, AMYLASE in the last 168 hours. No results for input(s): AMMONIA in the last 168 hours. Cardiac Enzymes: No results for input(s): CKTOTAL, CKMB, CKMBINDEX, TROPONINI in the last 168 hours. BNP (last 3 results) Recent Labs    02/14/24 0949  BNP 1,486.6*    ProBNP (last 3 results) No results for input(s): PROBNP in the last 8760 hours.  CBG: Recent Labs  Lab 07/16/24 1513 07/16/24 1951 07/16/24 2332 07/17/24 0411 07/17/24 0838  GLUCAP 144* 113* 129* 106* 106*   No results found for this or any previous visit (from the past 240 hours).   Studies: No results found.    Vernal Alstrom, MD  [name] 07/17/2024  If 7PM-7AM, please contact night-coverage

## 2024-07-17 NOTE — Progress Notes (Addendum)
 [facility] Progress Note   Subjective:   Seen in room - no issues today - no CP/dyspnea. S/p HD yesterday with 2L off, BP remains a little high.  Objective Vitals:   07/17/24 0014 07/17/24 0409 07/17/24 0459 07/17/24 0839  BP: (!) 156/67 (!) 158/80  (!) 166/69  Pulse: 73 76  79  Resp:    18  Temp:    98.5 F (36.9 C)  TempSrc:    Oral  SpO2:    (P) 97%  Weight:   56.2 kg   Height:       Physical Exam General: Chronically ill, but better appearing woman, NAD. Room air Heart: RRR, no murmur Lungs: CTA anteriorly Abdomen: soft, PEG in place Extremities: no LE edema Dialysis Access: R AVF +t/b  Additional Objective Labs: Basic Metabolic Panel: Recent Labs  Lab 07/11/24 0700 07/14/24 0213 07/16/24 0114  NA 129* 128* 130*  K 3.6 3.9 4.3  CL 89* 89* 89*  CO2 26 26 25   GLUCOSE 133* 108* 117*  BUN 40* 59* 48*  CREATININE 4.13* 4.72* 3.90*  CALCIUM  9.9 10.1 10.0  PHOS 4.4 2.4* 2.9   Liver Function Tests: Recent Labs  Lab 07/11/24 0700  ALBUMIN  2.7*   CBC: Recent Labs  Lab 07/11/24 0700 07/12/24 1327 07/14/24 0213 07/16/24 0114  WBC 4.5  --  5.9 4.1  HGB 7.0* 7.2* 7.0* 7.1*  HCT 21.1* 22.2* 21.2* 21.7*  MCV 94.2  --  95.9 97.3  PLT 157  --  160 148*   Medications:   carvedilol   25 mg Per Tube BID WC   Chlorhexidine  Gluconate Cloth  6 each Topical Q0600   cinacalcet   30 mg Oral Q M,W,F   cloNIDine   0.1 mg Oral BID   cycloSPORINE   200 mg Per Tube QPM   cycloSPORINE   225 mg Per Tube Daily   darbepoetin (ARANESP ) injection - DIALYSIS  150 mcg Subcutaneous Q Thu-1800   famotidine   10 mg Per Tube QHS   feeding supplement (NEPRO CARB STEADY)  1,000 mL Per Tube Q24H   fiber supplement (BANATROL TF)  60 mL Per Tube TID   gabapentin   100 mg Per Tube Daily   Gerhardt's butt cream   Topical BID   lacosamide   100 mg Per Tube BID   liver oil-zinc  oxide   Topical TID   multivitamin with minerals  1 tablet Per Tube Daily   mycophenolate   500 mg  Per Tube BID   mouth rinse  15 mL Mouth Rinse 4 times per day   oxidized cellulose  1 each Topical Once   predniSONE   15 mg Per Tube Q breakfast    Dialysis Orders MWF - NW 3:45hr, 400/A1.5, EDW 55.2kg, 2K/2Ca bath, AVF, no heparin  - Prev on Mircera 150mcg IV q 2 weeks - No VDRA   Assessment/Plan: Acute R intracranial hemorrhage: 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 Needs ongoing rehab. ESRD: Continue HD on MWF schedule - next HD tomorrow. HTN/volume: BP higher, no LE edema, low Na reflects excess volume. UF as tolerated. Off amlodipine , now on Coreg  25mg  BID. Changed clonidine  from 0.3mg  every other day to 0.1mg  BID -> ultimately would like to wean her off this, start losartan  25mg  daily - hopefully d/c clonidine  tomorrow. Anemia of ESRD: Hgb 7s, transfuse prn. Continue Aranesp  q Thurs - increase today's dose to 200mcg. Secondary HPTH: CorrCa high, Phos low. Continue sensipar , not on VDRA. Nutrition: Alb low, continue Nepro TF. Consider repeat swallowing study in future.  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 Hx T1D/gastroparesis Buttocks wound/?yeast: Using antifungal powder, improved. Dispo: Insurance declined LTAC, will be going to SNF. Tolerated HD in recliner on 11/7   [name] [name], DEVONNA 07/17/2024, 9:47 AM  [employer]

## 2024-07-18 DIAGNOSIS — I619 Nontraumatic intracerebral hemorrhage, unspecified: Secondary | ICD-10-CM | POA: Diagnosis not present

## 2024-07-18 LAB — RENAL FUNCTION PANEL
Albumin: 2.7 g/dL — ABNORMAL LOW (ref 3.5–5.0)
Anion gap: 16 — ABNORMAL HIGH (ref 5–15)
BUN: 54 mg/dL — ABNORMAL HIGH (ref 6–20)
CO2: 26 mmol/L (ref 22–32)
Calcium: 10.2 mg/dL (ref 8.9–10.3)
Chloride: 88 mmol/L — ABNORMAL LOW (ref 98–111)
Creatinine, Ser: 3.74 mg/dL — ABNORMAL HIGH (ref 0.44–1.00)
GFR, Estimated: 15 mL/min — ABNORMAL LOW (ref >60)
Glucose, Bld: 108 mg/dL — ABNORMAL HIGH (ref 70–99)
Phosphorus: 3.3 mg/dL (ref 2.5–4.6)
Potassium: 4.2 mmol/L (ref 3.5–5.1)
Sodium: 130 mmol/L — ABNORMAL LOW (ref 135–145)

## 2024-07-18 LAB — GLUCOSE, CAPILLARY
Glucose-Capillary: 111 mg/dL — ABNORMAL HIGH (ref 70–99)
Glucose-Capillary: 154 mg/dL — ABNORMAL HIGH (ref 70–99)
Glucose-Capillary: 156 mg/dL — ABNORMAL HIGH (ref 70–99)
Glucose-Capillary: 167 mg/dL — ABNORMAL HIGH (ref 70–99)

## 2024-07-18 LAB — CBC
HCT: 22.5 % — ABNORMAL LOW (ref 36.0–46.0)
Hemoglobin: 7.5 g/dL — ABNORMAL LOW (ref 12.0–15.0)
MCH: 31.8 pg (ref 26.0–34.0)
MCHC: 33.3 g/dL (ref 30.0–36.0)
MCV: 95.3 fL (ref 80.0–100.0)
Platelets: 167 K/uL (ref 150–400)
RBC: 2.36 MIL/uL — ABNORMAL LOW (ref 3.87–5.11)
RDW: 14.9 % (ref 11.5–15.5)
WBC: 3.7 K/uL — ABNORMAL LOW (ref 4.0–10.5)
nRBC: 0 % (ref 0.0–0.2)

## 2024-07-18 MED ORDER — CLONIDINE HCL 0.1 MG PO TABS
0.1000 mg | ORAL_TABLET | Freq: Every day | ORAL | Status: DC
  Administered 2024-07-19 – 2024-07-20 (×2): 0.1 mg via ORAL
  Filled 2024-07-18 (×3): qty 1

## 2024-07-18 MED ORDER — OXYCODONE HCL 5 MG PO TABS
ORAL_TABLET | ORAL | Status: AC
  Filled 2024-07-18: qty 1

## 2024-07-18 MED ORDER — ONDANSETRON HCL 4 MG/2ML IJ SOLN
INTRAMUSCULAR | Status: AC
  Filled 2024-07-18: qty 2

## 2024-07-18 NOTE — Progress Notes (Signed)
 Occupational Therapy Treatment Patient Details Name: [name] [name] MRN: [id number] DOB: 1981-02-13 Today's Date: 07/18/2024   History of present illness 43 yo female presents 05/19/24 for left gaze and L side weakness. 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 Intubated 9/15-9/23. EVD 9/15- 10/15; s/p shunt 10/15. S/p laparoscopy with intra-abdominal assistance ventricular-peritoneal shunt placement 10/15. PMH: HTN, ESRD on HD s/p renal and pancreatic transplants, DM, gastroparesis, multiple strokes (ischemic and hemorrhagic) with prepontine SAH, hydrocephalus requiring EVD placement   OT comments  Patient received in supine with mother assisting patient with self care. Patient pleasant and alert. Patient was able to get to EOB with mod assist to raise trunk and CGA for balance. Patient able to perform step pivot transfer to HD recliner with mod assist and face to face technique for UB support.  Once in chair transport arrived to take patient to HD.  Discharge recommendations continue to be appropriate.  Acute OT to continue to follow to address established goals to facilitate DC to next venue of care.        If plan is discharge home, recommend the following:  Assistance with cooking/housework;Assist for transportation;Help with stairs or ramp for entrance;A lot of help with walking and/or transfers;A lot of help with bathing/dressing/bathroom   Equipment Recommendations  Other (comment) (defer)    Recommendations for Other Services      Precautions / Restrictions Precautions Precautions: Fall Recall of Precautions/Restrictions: Impaired Precaution/Restrictions Comments: PEG Restrictions Weight Bearing Restrictions Per Provider Order: No       Mobility Bed Mobility Overal bed mobility: Needs Assistance Bed Mobility: Supine to Sit     Supine to sit: Mod  assist     General bed mobility comments: assist for trunk elevation    Transfers Overall transfer level: Needs assistance Equipment used: None Transfers: Sit to/from Stand, Bed to chair/wheelchair/BSC Sit to Stand: Min assist, Mod assist     Step pivot transfers: Mod assist     General transfer comment: face to face technique for support and patient able to step toward the right to transfer into HD recliner     Balance Overall balance assessment: Needs assistance Sitting-balance support: Bilateral upper extremity supported, Feet supported Sitting balance-Leahy Scale: Fair Sitting balance - Comments: CGA sitting EOB   Standing balance support: Bilateral upper extremity supported, During functional activity Standing balance-Leahy Scale: Poor Standing balance comment: reliant on therapist for support                           ADL either performed or assessed with clinical judgement   ADL Overall ADL's : Needs assistance/impaired     Grooming: Wash/dry face;Set up;Bed level                                 General ADL Comments: patient's mother assisted patient with self care before OT arrived with patient completing bathing with washing face with OT present    Extremity/Trunk Assessment              Vision       Perception     Praxis     Communication Communication Communication: Impaired Factors Affecting Communication: Difficulty expressing self;Reduced clarity of speech   Cognition Arousal: Alert Behavior During Therapy: [place] for tasks assessed/performed Cognition: Cognition impaired  Awareness: Intellectual awareness impaired Memory impairment (select all impairments): Short-term memory Attention impairment (select first level of impairment): Focused attention Executive functioning impairment (select all impairments): Initiation, Organization, Sequencing, Problem solving OT - Cognition Comments: perseverates on bottom  pain                 Following commands: Impaired Following commands impaired: Follows one step commands with increased time      Cueing   Cueing Techniques: Verbal cues, Gestural cues, Tactile cues, Visual cues  Exercises      Shoulder Instructions       General Comments limited visit due to transport arrival to take patient to HD    Pertinent Vitals/ Pain       Pain Assessment Pain Assessment: Faces Faces Pain Scale: Hurts even more Pain Location: bottom Pain Descriptors / Indicators: Discomfort, Grimacing, Guarding Pain Intervention(s): Limited activity within patient's tolerance, Monitored during session, Repositioned  Home Living                                          Prior Functioning/Environment              Frequency  Min 2X/week        Progress Toward Goals  OT Goals(current goals can now be found in the care plan section)  Progress towards OT goals: Progressing toward goals  Acute Rehab OT Goals Patient Stated Goal: less pain OT Goal Formulation: With patient Time For Goal Achievement: 07/22/24 Potential to Achieve Goals: Good ADL Goals Pt Will Perform Grooming: with set-up;sitting Pt Will Perform Lower Body Dressing: with mod assist;sit to/from stand Pt Will Transfer to Toilet: with mod assist;stand pivot transfer;with transfer board Pt/caregiver will Perform Home Exercise Program: Both right and left upper extremity;With written HEP provided;Increased strength Additional ADL Goal #1: pt will follow one step commands 100% of OT session to optimize functional participation in BADL Additional ADL Goal #2: pt will sustain attention to ADL task for ~2 minutes with mod cues.  Plan      Co-evaluation                 AM-PAC OT 6 Clicks Daily Activity     Outcome Measure   Help from another person eating meals?: Total Help from another person taking care of personal grooming?: A Lot Help from another person  toileting, which includes using toliet, bedpan, or urinal?: Total Help from another person bathing (including washing, rinsing, drying)?: A Lot Help from another person to put on and taking off regular upper body clothing?: A Lot Help from another person to put on and taking off regular lower body clothing?: Total 6 Click Score: 9    End of Session Equipment Utilized During Treatment: Gait belt  OT Visit Diagnosis: Unsteadiness on feet (R26.81);Muscle weakness (generalized) (M62.81);History of falling (Z91.81);Other symptoms and signs involving cognitive function;Cognitive communication deficit (R41.841);Low vision, both eyes (H54.2) Symptoms and signs involving cognitive functions: Nontraumatic SAH   Activity Tolerance Patient tolerated treatment well   Patient Left in bed;Other (comment) (left with transport)   Nurse Communication Mobility status        Time: 9280-9268 OT Time Calculation (min): 12 min  Charges: OT General Charges $OT Visit: 1 Visit OT Treatments $Therapeutic Activity: 8-22 mins  [name], OTA Acute Rehabilitation Services  Office [phone]   [name] 07/18/2024, 10:54 AM

## 2024-07-18 NOTE — Progress Notes (Signed)
 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  Patient was noted to have hemorrhagic stroke.  Neurology neurosurgery in critical care were consulted.  EVD was placed on the left side and was initially admitted to neuro ICU.  She was also intubated and had prolonged intubation.  Subsequently, patient was extubated on 05/27/2024.  Important events:  9/15 admitted with ICH plus IVH, EVD was placed for hydrocephalus, intubated 9/22 palliative care meeting.  Family is insistent that they want full scope of care 9/23 Extubated 10/1 -developed fevers pancultures drawn and broad-spectrum antibiotics started 10/2 became afebrile after starting antibiotics, did not tolerate raising EVD 10/6 EVD was raised from 10 to 30 cm water , tolerating well, remained afebrile 10/7 EVD is at 30 cm water , CT scan will be done tomorrow morning, no overnight issues 10/8 she became somnolent, CT head showed increasing hydrocephalus, EVD was titrated down to 10 cm of water  with improvement in mental status 10/15 VP shunt placement 10/17 transferred to progressive unit with [name]   Assessment/Plan: Principal Problem:   Hemorrhagic stroke [facility]) Active Problems:   Moderate protein-calorie malnutrition   Pressure injury of skin   Nontraumatic subcortical hemorrhage of right cerebral hemisphere [facility])  Acute right parietotemporal intraparenchymal hemorrhage / Intraventricular hemorrhage: Uncontrolled hypertension: Obstructive hydrocephalus s/p EVD changed to VP shunt: Seizure disorder S/p VP shunt  placement on 06/18/2024 by Dr. Mavis.  Continue Vimpat .  Neurosurgery intermittently following.   ESRD on dialysis: History of failed renal transplant: Secondary hyperparathyroidism: Nephrology on board.  Continue CellCept  and prednisone .  On Monday, Wednesday, Friday hemodialysis schedule.  TOC working on outpatient hemodialysis set up  Acute bronchitis: Resolved Intermittent low-grade fever:  Thought to be secondary to intracranial hemorrhage. Previous blood cultures showed contamination.  S/p antibiotics for respiratory cultures positive for Serratia and Klebsiella.  Repeat culture negative.  Afebrile.  Temperature max of 98.4 F.   History of pancreatic/kidney transplantation:  Continue cyclosporine , mycophenolate , and prednisone .   Hypertension:  Currently on Coreg , amlodipine  and clonidine  on nondialysis days.  Latest blood pressure at 119/60   Anemia of chronic disease:  Latest hemoglobin of 7.5 .Transfuse if Hb below 7.0   Type 1 diabetes with vasculopathy/gastroparesis:  Not on long-term insulin  anymore due to pancreatic transplant. On sliding scale at this time.   Moderate protein calorie malnutrition/dysphagia:  On PEG tube feeding.   Stage I sacral pressure ulcer present on admission: Continue wound care   Debility/deconditioning/disposition/goals of care:  Palliative care had seen the patient during hospitalization.  Family has insisted full code.   Palliative care has signed off 10/17.  Disposition pending.  TOC on board.  DVT prophylaxis: SCDs Start: 05/19/24 1724   Disposition: Uncertain at this time  Status is: Inpatient Remains inpatient appropriate because: Uncertain disposition,    Code Status:     Code Status: Full Code  Family Communication: Spoke with the patient's mother at bedside on 07/17/2024.  Consultants: Nephrology Neurosurgery  Procedures: Ventriculoperitoneal shunt placement Hemodialysis  Anti-infectives:  None  currently   Subjective: Today, patient was seen and examined at bedside.  Patient denies vomiting abdominal pain.  Has some  nausea and has been having bowel movements.  Denies any shortness of breath dyspnea or chest pain   Objective: Vitals:   07/18/24 1030 07/18/24 1040  BP: (!) 94/57 119/60  Pulse: 78 77  Resp: 15 12  Temp:    SpO2: 96% 99%   No intake or output data in the 24 hours ending 07/18/24 1126  Filed Weights   07/16/24 1229 07/17/24 0459 07/18/24 0413  Weight: 53.1 kg 56.2 kg 55.2 kg   Body mass index is 21.56 kg/m.   Physical Exam:  General:  Average built, not in obvious distress, chronically ill and Communicative HENT:   No scleral pallor or icterus noted. Oral mucosa is moist.  Chest:  Clear breath sounds.  Diminished breath sounds bilaterally. No crackles or wheezes.  CVS: S1 &S2 heard. No murmur.  Regular rate and rhythm. Abdomen: Soft, nontender, nondistended.  Bowel sounds are heard.  PEG tube in place. Extremities: No cyanosis, clubbing with trace edema.  Peripheral pulses are palpable.  Right upper extremity AV fistula in place Psych: Alert, awake and oriented, normal mood CNS:  No cranial nerve deficits.  Extremities Skin: Warm and dry.  No rashes noted.  Data Review: I have personally reviewed the following laboratory data and studies,  CBC: Recent Labs  Lab 07/12/24 1327 07/14/24 0213 07/16/24 0114 07/18/24 0648  WBC  --  5.9 4.1 3.7*  HGB 7.2* 7.0* 7.1* 7.5*  HCT 22.2* 21.2* 21.7* 22.5*  MCV  --  95.9 97.3 95.3  PLT  --  160 148* 167   Basic Metabolic Panel: Recent Labs  Lab 07/14/24 0213 07/16/24 0114 07/18/24 0648  NA 128* 130* 130*  K 3.9 4.3 4.2  CL 89* 89* 88*  CO2 26 25 26   GLUCOSE 108* 117* 108*  BUN 59* 48* 54*  CREATININE 4.72* 3.90* 3.74*  CALCIUM  10.1 10.0 10.2  MG 3.0* 2.9*  --   PHOS 2.4* 2.9 3.3   Liver Function Tests: Recent Labs  Lab 07/18/24 0648  ALBUMIN  2.7*   No results for input(s): LIPASE,  AMYLASE in the last 168 hours. No results for input(s): AMMONIA in the last 168 hours. Cardiac Enzymes: No results for input(s): CKTOTAL, CKMB, CKMBINDEX, TROPONINI in the last 168 hours. BNP (last 3 results) Recent Labs    02/14/24 0949  BNP 1,486.6*    ProBNP (last 3 results) No results for input(s): PROBNP in the last 8760 hours.  CBG: Recent Labs  Lab 07/16/24 1513 07/16/24 1951 07/16/24 2332 07/17/24 0411 07/17/24 0838  GLUCAP 144* 113* 129* 106* 106*   No results found for this or any previous visit (from the past 240 hours).   Studies: No results found.    Vernal Alstrom, MD  [name] 07/18/2024  If 7PM-7AM, please contact night-coverage

## 2024-07-18 NOTE — Progress Notes (Signed)
 [facility] Progress Note   Subjective:   Seen in KDU in recliner. Having nausea and abd pain - TF paused while on HD and will give pain meds. Denies CP/dyspnea. BP lower today after starting losartan  yesterday -> will d/c clonidine .  Objective Vitals:   07/18/24 0930 07/18/24 1000 07/18/24 1030 07/18/24 1040  BP: (!) 115/59 115/81 (!) 94/57 119/60  Pulse: 74 79 78 77  Resp: 13 17 15 12   Temp:      TempSrc:      SpO2: 93% 100% 96% 99%  Weight:      Height:       Physical Exam General: Chronically ill, but better appearing woman, NAD. Room air Heart: RRR, no murmur Lungs: CTA anteriorly Abdomen: soft, PEG in place Extremities: no LE edema Dialysis Access: R AVF +t/b  Additional Objective Labs: Basic Metabolic Panel: Recent Labs  Lab 07/14/24 0213 07/16/24 0114 07/18/24 0648  NA 128* 130* 130*  K 3.9 4.3 4.2  CL 89* 89* 88*  CO2 26 25 26   GLUCOSE 108* 117* 108*  BUN 59* 48* 54*  CREATININE 4.72* 3.90* 3.74*  CALCIUM  10.1 10.0 10.2  PHOS 2.4* 2.9 3.3   Liver Function Tests: Recent Labs  Lab 07/18/24 0648  ALBUMIN  2.7*    CBC: Recent Labs  Lab 07/14/24 0213 07/16/24 0114 07/18/24 0648  WBC 5.9 4.1 3.7*  HGB 7.0* 7.1* 7.5*  HCT 21.2* 21.7* 22.5*  MCV 95.9 97.3 95.3  PLT 160 148* 167   Medications:   carvedilol   25 mg Per Tube BID WC   Chlorhexidine  Gluconate Cloth  6 each Topical Q0600   cinacalcet   30 mg Oral Q M,W,F   cloNIDine   0.1 mg Oral BID   cycloSPORINE   200 mg Per Tube QPM   cycloSPORINE   225 mg Per Tube Daily   darbepoetin (ARANESP ) injection - DIALYSIS  200 mcg Subcutaneous Q Thu-1800   famotidine   10 mg Per Tube QHS   feeding supplement (NEPRO CARB STEADY)  1,000 mL Per Tube Q24H   fiber supplement (BANATROL TF)  60 mL Per Tube TID   gabapentin   100 mg Per Tube Daily   Gerhardt's butt cream   Topical BID   lacosamide   100 mg Per Tube BID   liver oil-zinc  oxide   Topical TID   losartan   25 mg Per Tube Q1200    multivitamin with minerals  1 tablet Per Tube Daily   mycophenolate   500 mg Per Tube BID   mouth rinse  15 mL Mouth Rinse 4 times per day   oxidized cellulose  1 each Topical Once   predniSONE   15 mg Per Tube Q breakfast    Dialysis Orders MWF - [facility] 3:45hr, 400/A1.5, EDW 55.2kg, 2K/2Ca bath, AVF, no heparin  - Prev on Mircera 150mcg IV q 2 weeks - No VDRA   Assessment/Plan: Acute R intracranial hemorrhage: 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 Needs ongoing rehab. ESRD: Continue HD on MWF schedule - HD now. HTN/volume: BP much better - actually low sided today. UF as tolerated. Off amlodipine , now on Coreg  25mg  BID. Losartan  started yesterday - will d/c clonidine  at this time. Anemia of ESRD: Hgb 7s, transfuse prn. Continue Aranesp  q Thurs (dose ^d on 11/13) Secondary HPTH: CorrCa high, Phos low. Continue sensipar , not on VDRA. Nutrition: Alb low, continue Nepro TF. Consider repeat swallowing study in future. 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 Hx T1D/gastroparesis Buttocks wound/yeast: Using antifungal powder, improved. Dispo: Insurance  declined LTAC, and SNF, appeal pending. Tolerated HD in recliner on 11/7, 11/14.   Izetta Boehringer, PA-C 07/18/2024, 11:18 AM  [employer]

## 2024-07-18 NOTE — Progress Notes (Signed)
 PT Cancellation Note  Patient Details Name: [name] [name] MRN: [id number] DOB: 19-Jun-1981   Cancelled Treatment:    Reason Eval/Treat Not Completed: (P) Patient at procedure or test/unavailable (Pt off floor for HD. Will follow up as able.)   Darryle George 07/18/2024, 11:46 AM

## 2024-07-18 NOTE — Progress Notes (Signed)
 Subjective: The patient is alert and pleasant.  She is in no apparent distress.  Objective: Vital signs in last 24 hours: Temp:  [98.1 F (36.7 C)-98.5 F (36.9 C)] 98.1 F (36.7 C) (11/14 0413) Pulse Rate:  [74-82] 75 (11/14 0413) Resp:  [16-18] 16 (11/14 0413) BP: (147-166)/(63-73) 157/69 (11/14 0413) SpO2:  [96 %-99 %] 97 % (11/14 0413) Weight:  [55.2 kg] 55.2 kg (11/14 0413) Estimated body mass index is 21.56 kg/m as calculated from the following:   Height as of this encounter: 5' 3 (1.6 m).   Weight as of this encounter: 55.2 kg.   Intake/Output from previous day: No intake/output data recorded. Intake/Output this shift: No intake/output data recorded.  Physical exam the patient is alert and pleasant.  She is conversant.  She is moving all 4 extremities.  Lab Results: Recent Labs    07/16/24 0114 07/18/24 0648  WBC 4.1 3.7*  HGB 7.1* 7.5*  HCT 21.7* 22.5*  PLT 148* 167   BMET Recent Labs    07/16/24 0114  NA 130*  K 4.3  CL 89*  CO2 25  GLUCOSE 117*  BUN 48*  CREATININE 3.90*  CALCIUM  10.0    Studies/Results: No results found.  Assessment/Plan: Status post VP shunt: The patient is doing well.  LOS: 60 days     [name] [name] 07/18/2024, 7:43 AM     Patient ID: [name] [name], female   DOB: 1980/12/31, 43 y.o.   MRN: [id number]

## 2024-07-18 NOTE — Plan of Care (Signed)
  Problem: Tissue Perfusion: Goal: Adequacy of tissue perfusion will improve Outcome: Progressing   Problem: Education: Goal: Knowledge of disease or condition will improve Outcome: Progressing Goal: Knowledge of patient specific risk factors will improve (DELETE if not current risk factor) Outcome: Progressing   Problem: Education: Goal: Knowledge of General Education information will improve Description: Including pain rating scale, medication(s)/side effects and non-pharmacologic comfort measures Outcome: Progressing

## 2024-07-18 NOTE — Progress Notes (Addendum)
 The patient unable to UF off 2.5 L as ordered d/t hypotension. Meds given during HD: Zofran  4 mg IV. Oxycodone  5 mg po.  07/18/24 1155  Vitals  BP 120/68  BP Location Left Arm  BP Method Automatic  Patient Position (if appropriate) Lying  Pulse Rate 80  Resp 20  Oxygen  Therapy  SpO2 100 %  O2 Device Room Air  Patient Activity (if Appropriate) In chair  Pulse Oximetry Type Continuous  During Treatment Monitoring  HD Safety Checks Performed Yes  Intra-Hemodialysis Comments See progress note  Post Treatment  Dialyzer Clearance Clear  Liters Processed 83.8  Fluid Removed (mL) 2300 mL  Tolerated HD Treatment Yes  Fistula / Graft Right Upper arm Arteriovenous fistula  Placement Date/Time: (c) 12/08/20 1403   Placed prior to admission: Yes  Orientation: Right  Access Location: Upper arm  Access Type: Arteriovenous fistula  Site Condition No complications  Fistula / Graft Assessment Present;Thrill;Bruit  Status Deaccessed  Needle Size 15  Drainage Description None

## 2024-07-19 ENCOUNTER — Inpatient Hospital Stay (HOSPITAL_COMMUNITY)

## 2024-07-19 DIAGNOSIS — I619 Nontraumatic intracerebral hemorrhage, unspecified: Secondary | ICD-10-CM | POA: Diagnosis not present

## 2024-07-19 LAB — GLUCOSE, CAPILLARY
Glucose-Capillary: 111 mg/dL — ABNORMAL HIGH (ref 70–99)
Glucose-Capillary: 120 mg/dL — ABNORMAL HIGH (ref 70–99)
Glucose-Capillary: 139 mg/dL — ABNORMAL HIGH (ref 70–99)
Glucose-Capillary: 173 mg/dL — ABNORMAL HIGH (ref 70–99)
Glucose-Capillary: 141 mg/dL — ABNORMAL HIGH (ref 70–99)

## 2024-07-19 NOTE — Progress Notes (Signed)
 Pt c/o non productive cough overnight and mild right upper chest pain associated with cough. VS stable, no fever. Pokhrel, MD notified.

## 2024-07-19 NOTE — Plan of Care (Signed)
  Problem: Education: Goal: Knowledge of patient specific risk factors will improve (DELETE if not current risk factor) Outcome: Progressing   Problem: Coping: Goal: Will verbalize positive feelings about self Outcome: Progressing   Problem: Health Behavior/Discharge Planning: Goal: Goals will be collaboratively established with patient/family Outcome: Progressing

## 2024-07-19 NOTE — Progress Notes (Signed)
 [facility] Progress Note   Subjective:   Seen in room, mother at bedside. No new concerns reported. Pt denies CP, SOB, dizziness. Still w/ intermittent cough.   Objective Vitals:   07/19/24 0449 07/19/24 0722 07/19/24 0847 07/19/24 1108  BP:  (!) 153/72 (!) 153/72 134/65  Pulse:  79 79 77  Resp:  19  19  Temp:  98.3 F (36.8 C)  98.4 F (36.9 C)  TempSrc:  Oral  Oral  SpO2:  100%  95%  Weight: 55.4 kg     Height:       Physical Exam General: chronically ill woman, alert and in NAD Heart: RRR, no murmurs rubs or gallops Lungs: CTA anteriorly, respirations unlabored Abdomen: Soft, non-distended, +BS Extremities: No edema b/l lower extremities Dialysis Access:  RUE AVF + t/b  Additional Objective Labs: Basic Metabolic Panel: Recent Labs  Lab 07/14/24 0213 07/16/24 0114 07/18/24 0648  NA 128* 130* 130*  K 3.9 4.3 4.2  CL 89* 89* 88*  CO2 26 25 26   GLUCOSE 108* 117* 108*  BUN 59* 48* 54*  CREATININE 4.72* 3.90* 3.74*  CALCIUM  10.1 10.0 10.2  PHOS 2.4* 2.9 3.3   Liver Function Tests: Recent Labs  Lab 07/18/24 0648  ALBUMIN  2.7*   No results for input(s): LIPASE, AMYLASE in the last 168 hours. CBC: Recent Labs  Lab 07/14/24 0213 07/16/24 0114 07/18/24 0648  WBC 5.9 4.1 3.7*  HGB 7.0* 7.1* 7.5*  HCT 21.2* 21.7* 22.5*  MCV 95.9 97.3 95.3  PLT 160 148* 167   Blood Culture    Component Value Date/Time   SDES CSF 06/11/2024 0920   SPECREQUEST LP 06/11/2024 0920   CULT  06/11/2024 0920    NO GROWTH 3 DAYS Performed at [facility], [address], [place], [name] [address]    REPTSTATUS 06/14/2024 FINAL 06/11/2024 0920    Cardiac Enzymes: No results for input(s): CKTOTAL, CKMB, CKMBINDEX, TROPONINI in the last 168 hours. CBG: Recent Labs  Lab 07/18/24 1936 07/18/24 2343 07/19/24 0337 07/19/24 0722 07/19/24 1116  GLUCAP 167* 154* 111* 120* 139*   Iron  Studies: No results for input(s): IRON , TIBC,  TRANSFERRIN, FERRITIN in the last 72 hours. @lablastinr3 @ Studies/Results: DG Chest 1 View Result Date: 07/19/2024 EXAM: 1 VIEW(S) XRAY OF THE CHEST 07/19/2024 07:53:00 AM COMPARISON: Portable chest 07/04/2024. CLINICAL HISTORY: Chest pain. FINDINGS: LINES, TUBES AND DEVICES: VP shunt tubing superimposes over the medial right chest. LUNGS AND PLEURA: There are mild chronic interstitial changes in the lower lung fields. No focal pneumonia is evident. No vascular congestion is seen. No pleural effusion. No pneumothorax. HEART AND MEDIASTINUM: Cardiomegaly. The mediastinal silhouette is normally outlined. BONES AND SOFT TISSUES: A calcified right axillary lymph node is again noted. No acute osseous abnormality. IMPRESSION: 1. No acute cardiopulmonary abnormality identified. 2. Cardiomegaly without vascular congestion. 3. Stable chest with chronic change. Electronically signed by: Francis Quam MD 07/19/2024 08:00 AM EST RP Workstation: [id number]   Medications:   carvedilol   25 mg Per Tube BID WC   cinacalcet   30 mg Oral Q M,W,F   cloNIDine   0.1 mg Oral Daily   cycloSPORINE   200 mg Per Tube QPM   cycloSPORINE   225 mg Per Tube Daily   darbepoetin (ARANESP ) injection - DIALYSIS  200 mcg Subcutaneous Q Thu-1800   famotidine   10 mg Per Tube QHS   feeding supplement (NEPRO CARB STEADY)  1,000 mL Per Tube Q24H   fiber supplement (BANATROL TF)  60 mL  Per Tube TID   gabapentin   100 mg Per Tube Daily   Gerhardt's butt cream   Topical BID   lacosamide   100 mg Per Tube BID   liver oil-zinc  oxide   Topical TID   losartan   25 mg Per Tube Q1200   multivitamin with minerals  1 tablet Per Tube Daily   mycophenolate   500 mg Per Tube BID   mouth rinse  15 mL Mouth Rinse 4 times per day   oxidized cellulose  1 each Topical Once   predniSONE   15 mg Per Tube Q breakfast    Dialysis Orders: 3:45hr, 400/A1.5, EDW 55.2kg, 2K/2Ca bath, AVF, no heparin  - Prev on Mircera 150mcg IV q 2 weeks - No  VDRA  Assessment/Plan: Acute R intracranial hemorrhage: 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 Needs ongoing rehab. ESRD: Continue HD on MWF schedule HTN/volume: BP much better - actually had some hypotension with HD. UF as tolerated. Off amlodipine , now on Coreg  25mg  BID. Losartan  started this week and clonidine  down to 0.1mg  daily- taper off as able. Anemia of ESRD: Hgb 7s, transfuse prn. Continue Aranesp  q Thurs (dose ^d on 11/13) Secondary HPTH: CorrCa high, Phos low. Continue sensipar , not on VDRA. Nutrition: Alb low, continue Nepro TF. Consider repeat swallowing study in future. 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 Buttocks wound/yeast: Using antifungal powder, improved. Dispo: Insurance declined LTAC, and SNF, appeal pending. Tolerated HD in recliner on 11/7, 11/14.  Lucie Collet, PA-C 07/19/2024, 11:31 AM   [facility] Pager: [phone]

## 2024-07-19 NOTE — Progress Notes (Signed)
 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  Patient was noted to have hemorrhagic stroke.  Neurology neurosurgery in critical care were consulted.  EVD was placed on the left side and was initially admitted to neuro ICU.  She was also intubated and had prolonged intubation.  Subsequently, patient was extubated on 05/27/2024.  Important events:  9/15 admitted with ICH plus IVH, EVD was placed for hydrocephalus, intubated 9/22 palliative care meeting.  Family is insistent that they want full scope of care 9/23 Extubated 10/1 -developed fevers pancultures drawn and broad-spectrum antibiotics started 10/2 became afebrile after starting antibiotics, did not tolerate raising EVD 10/6 EVD was raised from 10 to 30 cm water , tolerating well, remained afebrile 10/7 EVD is at 30 cm water , CT scan will be done tomorrow morning, no overnight issues 10/8 she became somnolent, CT head showed increasing hydrocephalus, EVD was titrated down to 10 cm of water  with improvement in mental status 10/15 VP shunt placement 10/17 transferred to progressive unit with [name]   Assessment/Plan: Principal Problem:   Hemorrhagic stroke [facility]) Active Problems:   Moderate protein-calorie malnutrition   Pressure injury of skin   Nontraumatic subcortical hemorrhage of right cerebral hemisphere [facility])  Acute right parietotemporal intraparenchymal hemorrhage / Intraventricular hemorrhage: Uncontrolled hypertension: Obstructive hydrocephalus s/p EVD changed to VP shunt: Seizure disorder S/p VP shunt  placement on 06/18/2024 by Dr. Mavis.  Continue Vimpat .  Neurosurgery intermittently following.   ESRD on dialysis: History of failed renal transplant: Secondary hyperparathyroidism: Nephrology on board.  Continue CellCept  and prednisone .  On Monday, Wednesday, Friday hemodialysis schedule.  TOC working on outpatient hemodialysis set up  Acute bronchitis: Resolved Intermittent low-grade fever:  Thought to be secondary to intracranial hemorrhage. Previous blood cultures showed contamination.  S/p antibiotics for respiratory cultures positive for Serratia and Klebsiella.  Repeat culture negative.  Afebrile.  Temperature max of 98.5 F.   History of pancreatic/kidney transplantation:  Continue cyclosporine , mycophenolate , and prednisone .   Hypertension:  Currently on Coreg , amlodipine  and clonidine  on nondialysis days.  Latest blood pressure at 153/72   Anemia of chronic disease:  Latest hemoglobin of 7.5 .Transfuse if Hb below 7.0   Type 1 diabetes with vasculopathy/gastroparesis:  Not on long-term insulin  anymore due to pancreatic transplant. On sliding scale at this time.   Moderate protein calorie malnutrition/dysphagia:  On PEG tube feeding.   Stage I sacral pressure ulcer present on admission: Continue wound care   Debility/deconditioning/disposition/goals of care:  Palliative care had seen the patient during hospitalization.  Family has insisted full code.   Palliative care has signed off 10/17.  Disposition pending.  TOC on board.  Cough with some chest pain.  Chest x-ray done today was negative.  Currently comfortable.  Likely musculoskeletal nonspecific.  DVT prophylaxis: SCDs Start: 05/19/24 1724   Disposition: Uncertain at this time  Status is: Inpatient Remains inpatient appropriate because: Uncertain disposition,    Code Status:     Code Status: Full Code  Family Communication: Spoke with the patient's mother at bedside on  07/19/2024.  Consultants: Nephrology Neurosurgery  Procedures: Ventriculoperitoneal shunt placement Hemodialysis  Anti-infectives:  None currently  Subjective: Today, patient was seen and examined at bedside.  Patient complained of some cough and chest pain while coughing.  No fever chills or rigor.  Patient's mom at bedside.  At the time of my interview patient comfortable and sleeping.  Objective: Vitals:   07/19/24 0722 07/19/24 0847  BP: (!) 153/72 (!) 153/72  Pulse: 79 79  Resp: 19   Temp: 98.3 F (36.8 C)   SpO2: 100%     Intake/Output Summary (Last 24 hours) at 07/19/2024 1021 Last data filed at 07/19/2024 0902 Gross per 24 hour  Intake 5860 ml  Output 2300 ml  Net 3560 ml    Filed Weights   07/17/24 0459 07/18/24 0413 07/19/24 0449  Weight: 56.2 kg 55.2 kg 55.4 kg   Body mass index is 21.64 kg/m.   Physical Exam:  General:  Average built, not in obvious distress, chronically ill, somnolent. HENT:   No scleral pallor or icterus noted. Oral mucosa is moist.  Chest: Decreased breath sounds bilaterally. CVS: S1 &S2 heard. No murmur.  Regular rate and rhythm. Abdomen: Soft, nontender, nondistended.  Bowel sounds are heard.  PEG tube in place. Extremities: No cyanosis, clubbing with trace edema.  Peripheral pulses are palpable.  Right upper extremity AV fistula in place Psych: Mildly somnolent CNS: Moving extremities somnolent Skin: Warm and dry.  No rashes noted.  Data Review: I have personally reviewed the following laboratory data and studies,  CBC: Recent Labs  Lab 07/12/24 1327 07/14/24 0213 07/16/24 0114 07/18/24 0648  WBC  --  5.9 4.1 3.7*  HGB 7.2* 7.0* 7.1* 7.5*  HCT 22.2* 21.2* 21.7* 22.5*  MCV  --  95.9 97.3 95.3  PLT  --  160 148* 167   Basic Metabolic Panel: Recent Labs  Lab 07/14/24 0213 07/16/24 0114 07/18/24 0648  NA 128* 130* 130*  K 3.9 4.3 4.2  CL 89* 89* 88*  CO2 26 25 26   GLUCOSE 108* 117* 108*  BUN 59* 48* 54*   CREATININE 4.72* 3.90* 3.74*  CALCIUM  10.1 10.0 10.2  MG 3.0* 2.9*  --   PHOS 2.4* 2.9 3.3   Liver Function Tests: Recent Labs  Lab 07/18/24 0648  ALBUMIN  2.7*   No results for input(s): LIPASE, AMYLASE in the last 168 hours. No results for input(s): AMMONIA in the last 168 hours. Cardiac Enzymes: No results for input(s): CKTOTAL, CKMB, CKMBINDEX, TROPONINI in the last 168 hours. BNP (last 3 results) Recent Labs    02/14/24 0949  BNP 1,486.6*    ProBNP (last 3 results) No results for input(s): PROBNP in the last 8760 hours.  CBG: Recent Labs  Lab 07/18/24 1558 07/18/24 1936 07/18/24 2343 07/19/24 0337 07/19/24 0722  GLUCAP 156* 167* 154* 111* 120*   No results found for this or any previous visit (from the past 240 hours).   Studies: DG Chest 1 View Result Date: 07/19/2024 EXAM: 1 VIEW(S) XRAY OF THE CHEST 07/19/2024 07:53:00 AM COMPARISON: Portable chest 07/04/2024. CLINICAL HISTORY: Chest pain. FINDINGS: LINES, TUBES AND DEVICES: VP shunt tubing superimposes over the medial right chest. LUNGS AND PLEURA: There are mild chronic interstitial changes in the lower lung fields. No focal pneumonia is evident. No vascular congestion is seen. No pleural effusion. No pneumothorax. HEART AND MEDIASTINUM: Cardiomegaly. The mediastinal silhouette is normally outlined. BONES AND SOFT TISSUES: A calcified right axillary lymph node is again noted. No acute osseous abnormality. IMPRESSION: 1. No acute cardiopulmonary abnormality identified. 2. Cardiomegaly without vascular congestion. 3. Stable chest with  chronic change. Electronically signed by: Francis Quam MD 07/19/2024 08:00 AM EST RP Workstation: [id number]      Vernal Alstrom, MD  [name] 07/19/2024  If 7PM-7AM, please contact night-coverage

## 2024-07-19 NOTE — Progress Notes (Signed)
 Patient removed PIV by accident.  MD notified.  Patient requesting PIV not to be replaced.  MD made aware, awaiting response via secure chat.

## 2024-07-20 DIAGNOSIS — I619 Nontraumatic intracerebral hemorrhage, unspecified: Secondary | ICD-10-CM | POA: Diagnosis not present

## 2024-07-20 LAB — GLUCOSE, CAPILLARY
Glucose-Capillary: 106 mg/dL — ABNORMAL HIGH (ref 70–99)
Glucose-Capillary: 120 mg/dL — ABNORMAL HIGH (ref 70–99)
Glucose-Capillary: 127 mg/dL — ABNORMAL HIGH (ref 70–99)
Glucose-Capillary: 141 mg/dL — ABNORMAL HIGH (ref 70–99)
Glucose-Capillary: 147 mg/dL — ABNORMAL HIGH (ref 70–99)
Glucose-Capillary: 172 mg/dL — ABNORMAL HIGH (ref 70–99)
Glucose-Capillary: 175 mg/dL — ABNORMAL HIGH (ref 70–99)

## 2024-07-20 MED ORDER — CHLORHEXIDINE GLUCONATE CLOTH 2 % EX PADS
6.0000 | MEDICATED_PAD | Freq: Every day | CUTANEOUS | Status: DC
  Administered 2024-07-20 – 2024-07-26 (×7): 6 via TOPICAL

## 2024-07-20 NOTE — Plan of Care (Signed)
 Problem: Education: Goal: Ability to describe self-care measures that may prevent or decrease complications (Diabetes Survival Skills Education) will improve Outcome: Progressing Goal: Individualized Educational Video(s) Outcome: Progressing   Problem: Coping: Goal: Ability to adjust to condition or change in health will improve Outcome: Progressing   Problem: Fluid Volume: Goal: Ability to maintain a balanced intake and output will improve Outcome: Progressing   Problem: Health Behavior/Discharge Planning: Goal: Ability to identify and utilize available resources and services will improve Outcome: Progressing Goal: Ability to manage health-related needs will improve Outcome: Progressing   Problem: Metabolic: Goal: Ability to maintain appropriate glucose levels will improve Outcome: Progressing   Problem: Nutritional: Goal: Maintenance of adequate nutrition will improve Outcome: Progressing Goal: Progress toward achieving an optimal weight will improve Outcome: Progressing   Problem: Skin Integrity: Goal: Risk for impaired skin integrity will decrease Outcome: Progressing   Problem: Tissue Perfusion: Goal: Adequacy of tissue perfusion will improve Outcome: Progressing   Problem: Education: Goal: Knowledge of disease or condition will improve Outcome: Progressing Goal: Knowledge of secondary prevention will improve (MUST DOCUMENT ALL) Outcome: Progressing Goal: Knowledge of patient specific risk factors will improve (DELETE if not current risk factor) Outcome: Progressing   Problem: Intracerebral Hemorrhage Tissue Perfusion: Goal: Complications of Intracerebral Hemorrhage will be minimized Outcome: Progressing   Problem: Coping: Goal: Will verbalize positive feelings about self Outcome: Progressing Goal: Will identify appropriate support needs Outcome: Progressing   Problem: Health Behavior/Discharge Planning: Goal: Ability to manage health-related needs will  improve Outcome: Progressing Goal: Goals will be collaboratively established with patient/family Outcome: Progressing   Problem: Self-Care: Goal: Ability to participate in self-care as condition permits will improve Outcome: Progressing Goal: Verbalization of feelings and concerns over difficulty with self-care will improve Outcome: Progressing Goal: Ability to communicate needs accurately will improve Outcome: Progressing   Problem: Nutrition: Goal: Risk of aspiration will decrease Outcome: Progressing Goal: Dietary intake will improve Outcome: Progressing   Problem: Education: Goal: Knowledge of General Education information will improve Description: Including pain rating scale, medication(s)/side effects and non-pharmacologic comfort measures Outcome: Progressing   Problem: Health Behavior/Discharge Planning: Goal: Ability to manage health-related needs will improve Outcome: Progressing   Problem: Clinical Measurements: Goal: Ability to maintain clinical measurements within normal limits will improve Outcome: Progressing Goal: Will remain free from infection Outcome: Progressing Goal: Diagnostic test results will improve Outcome: Progressing Goal: Respiratory complications will improve Outcome: Progressing Goal: Cardiovascular complication will be avoided Outcome: Progressing   Problem: Activity: Goal: Risk for activity intolerance will decrease Outcome: Progressing   Problem: Nutrition: Goal: Adequate nutrition will be maintained Outcome: Progressing   Problem: Coping: Goal: Level of anxiety will decrease Outcome: Progressing   Problem: Elimination: Goal: Will not experience complications related to bowel motility Outcome: Progressing   Problem: Pain Managment: Goal: General experience of comfort will improve and/or be controlled Outcome: Progressing   Problem: Safety: Goal: Ability to remain free from injury will improve Outcome: Progressing    Problem: Skin Integrity: Goal: Risk for impaired skin integrity will decrease Outcome: Progressing   Problem: Education: Goal: Knowledge of the prescribed therapeutic regimen will improve Outcome: Progressing   Problem: Clinical Measurements: Goal: Usual level of consciousness will be regained or maintained. Outcome: Progressing Goal: Neurologic status will improve Outcome: Progressing Goal: Ability to maintain intracranial pressure will improve Outcome: Progressing   Problem: Skin Integrity: Goal: Demonstration of wound healing without infection will improve Outcome: Progressing   Problem: Education: Goal: Knowledge of disease and its progression will improve Outcome: Progressing  Goal: Individualized Educational Video(s) Outcome: Progressing   Problem: Fluid Volume: Goal: Compliance with measures to maintain balanced fluid volume will improve Outcome: Progressing

## 2024-07-20 NOTE — Progress Notes (Signed)
 [facility] Progress Note   Subjective:   Alert and pleasant this morning. No new concerns. Denies SOB, CP, dizziness  Objective Vitals:   07/20/24 0110 07/20/24 0500 07/20/24 0520 07/20/24 0800  BP: 132/63  (!) 170/73 (!) 145/72  Pulse: 69  76 78  Resp: 18  18 18   Temp: 98.3 F (36.8 C)  97.6 F (36.4 C) (!) 97.4 F (36.3 C)  TempSrc: Oral  Oral Oral  SpO2: 100%  100% 100%  Weight:  57.8 kg    Height:       Physical Exam General: alert female in NAD Heart: RRR, no murmurs, rubs or gallops Lungs: CTA bilaterally, respirations unlabored Abdomen: Soft, non-distended Extremities: no edema b/l lower extremities Dialysis Access: AVF + t/b  Additional Objective Labs: Basic Metabolic Panel: Recent Labs  Lab 07/14/24 0213 07/16/24 0114 07/18/24 0648  NA 128* 130* 130*  K 3.9 4.3 4.2  CL 89* 89* 88*  CO2 26 25 26   GLUCOSE 108* 117* 108*  BUN 59* 48* 54*  CREATININE 4.72* 3.90* 3.74*  CALCIUM  10.1 10.0 10.2  PHOS 2.4* 2.9 3.3   Liver Function Tests: Recent Labs  Lab 07/18/24 0648  ALBUMIN  2.7*   No results for input(s): LIPASE, AMYLASE in the last 168 hours. CBC: Recent Labs  Lab 07/14/24 0213 07/16/24 0114 07/18/24 0648  WBC 5.9 4.1 3.7*  HGB 7.0* 7.1* 7.5*  HCT 21.2* 21.7* 22.5*  MCV 95.9 97.3 95.3  PLT 160 148* 167   Blood Culture    Component Value Date/Time   SDES CSF 06/11/2024 0920   SPECREQUEST LP 06/11/2024 0920   CULT  06/11/2024 0920    NO GROWTH 3 DAYS Performed at [facility], [address], [place], [name] [address]    REPTSTATUS 06/14/2024 FINAL 06/11/2024 0920    Cardiac Enzymes: No results for input(s): CKTOTAL, CKMB, CKMBINDEX, TROPONINI in the last 168 hours. CBG: Recent Labs  Lab 07/19/24 1514 07/19/24 2028 07/20/24 0111 07/20/24 0523 07/20/24 0859  GLUCAP 173* 141* 147* 106* 120*   Iron  Studies: No results for input(s): IRON , TIBC, TRANSFERRIN, FERRITIN in the last 72  hours. @lablastinr3 @ Studies/Results: DG Chest 1 View Result Date: 07/19/2024 EXAM: 1 VIEW(S) XRAY OF THE CHEST 07/19/2024 07:53:00 AM COMPARISON: Portable chest 07/04/2024. CLINICAL HISTORY: Chest pain. FINDINGS: LINES, TUBES AND DEVICES: VP shunt tubing superimposes over the medial right chest. LUNGS AND PLEURA: There are mild chronic interstitial changes in the lower lung fields. No focal pneumonia is evident. No vascular congestion is seen. No pleural effusion. No pneumothorax. HEART AND MEDIASTINUM: Cardiomegaly. The mediastinal silhouette is normally outlined. BONES AND SOFT TISSUES: A calcified right axillary lymph node is again noted. No acute osseous abnormality. IMPRESSION: 1. No acute cardiopulmonary abnormality identified. 2. Cardiomegaly without vascular congestion. 3. Stable chest with chronic change. Electronically signed by: Francis Quam MD 07/19/2024 08:00 AM EST RP Workstation: [id number]   Medications:   carvedilol   25 mg Per Tube BID WC   cinacalcet   30 mg Oral Q M,W,F   cloNIDine   0.1 mg Oral Daily   cycloSPORINE   200 mg Per Tube QPM   cycloSPORINE   225 mg Per Tube Daily   darbepoetin (ARANESP ) injection - DIALYSIS  200 mcg Subcutaneous Q Thu-1800   famotidine   10 mg Per Tube QHS   feeding supplement (NEPRO CARB STEADY)  1,000 mL Per Tube Q24H   fiber supplement (BANATROL TF)  60 mL Per Tube TID   gabapentin   100 mg Per  Tube Daily   Gerhardt's butt cream   Topical BID   lacosamide   100 mg Per Tube BID   liver oil-zinc  oxide   Topical TID   losartan   25 mg Per Tube Q1200   multivitamin with minerals  1 tablet Per Tube Daily   mycophenolate   500 mg Per Tube BID   mouth rinse  15 mL Mouth Rinse 4 times per day   oxidized cellulose  1 each Topical Once   predniSONE   15 mg Per Tube Q breakfast    Dialysis Orders: 3:45hr, 400/A1.5, EDW 55.2kg, 2K/2Ca bath, AVF, no heparin  - Prev on Mircera 150mcg IV q 2 weeks - No VDRA  Assessment/Plan: Acute R intracranial  hemorrhage: 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 Needs ongoing rehab. ESRD: Continue HD on MWF schedule HTN/volume: BP much better - actually had some hypotension with HD. UF as tolerated. Off amlodipine , now on Coreg  25mg  BID. Losartan  started this week and clonidine  down to 0.1mg  daily- taper off as able. Anemia of ESRD: Hgb 7s, transfuse prn. Continue Aranesp  q Thurs (dose ^d on 11/13) Secondary HPTH: CorrCa high, Phos low. Continue sensipar , not on VDRA. Nutrition: Alb low, continue Nepro TF. Consider repeat swallowing study in future. 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 Buttocks wound/yeast: Using antifungal powder, improved. Dispo: Insurance declined LTAC, and SNF, appeal pending. Tolerated HD in recliner on 11/7, 11/14.  Lucie Collet, PA-C 07/20/2024, 10:12 AM  [facility] Pager: [phone]

## 2024-07-20 NOTE — Progress Notes (Signed)
 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  Patient was noted to have hemorrhagic stroke.  Neurology neurosurgery in critical care were consulted.  EVD was placed on the left side and was initially admitted to neuro ICU.  She was also intubated and had prolonged intubation.  Subsequently, patient was extubated on 05/27/2024.  Important events:  9/15 admitted with ICH plus IVH, EVD was placed for hydrocephalus, intubated 9/22 palliative care meeting.  Family is insistent that they want full scope of care 9/23 Extubated 10/1 -developed fevers pancultures drawn and broad-spectrum antibiotics started 10/2 became afebrile after starting antibiotics, did not tolerate raising EVD 10/6 EVD was raised from 10 to 30 cm water , tolerating well, remained afebrile 10/7 EVD is at 30 cm water , CT scan will be done tomorrow morning, no overnight issues 10/8 she became somnolent, CT head showed increasing hydrocephalus, EVD was titrated down to 10 cm of water  with improvement in mental status 10/15 VP shunt placement 10/17 transferred to progressive unit with [name]   Assessment/Plan: Principal Problem:   Hemorrhagic stroke [facility]) Active Problems:   Moderate protein-calorie malnutrition   Pressure injury of skin   Nontraumatic subcortical hemorrhage of right cerebral hemisphere [facility])  Acute right parietotemporal intraparenchymal hemorrhage / Intraventricular hemorrhage: Uncontrolled hypertension: Obstructive hydrocephalus s/p EVD changed to VP shunt: Seizure disorder S/p VP shunt  placement on 06/18/2024 by Dr. Mavis.  Continue Vimpat .  Neurosurgery intermittently following.   ESRD on dialysis: History of failed renal transplant: Secondary hyperparathyroidism: Nephrology on board.  Continue CellCept  and prednisone .  On Monday, Wednesday, Friday hemodialysis schedule.  TOC working on outpatient hemodialysis set up  Acute bronchitis: Resolved Intermittent low-grade fever:  Thought to be secondary to intracranial hemorrhage. Previous blood cultures showed contamination.  S/p antibiotics for respiratory cultures positive for Serratia and Klebsiella.  Repeat culture negative.  Afebrile.  Temperature max of 98.6 F.   History of pancreatic/kidney transplantation:  Continue cyclosporine , mycophenolate , and prednisone .   Hypertension:  Currently on Coreg , amlodipine  and clonidine  on nondialysis days.  Latest blood pressure at 153/72   Anemia of chronic disease:  Latest hemoglobin of 7.5 .Transfuse if Hb below 7.0   Type 1 diabetes with vasculopathy/gastroparesis:  Not on long-term insulin  anymore due to pancreatic transplant. On sliding scale at this time.   Moderate protein calorie malnutrition/dysphagia:  On PEG tube feeding.   Stage I sacral pressure ulcer present on admission: Continue wound care   Debility/deconditioning/disposition/goals of care:  Palliative care had seen the patient during hospitalization.  Family has insisted full code.   Palliative care has signed off 10/17.  Disposition pending.  TOC on board.  Cough with some chest pain.  Chest x-ray done today was negative.  Currently comfortable.  Likely musculoskeletal nonspecific.  DVT prophylaxis: SCDs Start: 05/19/24 1724   Disposition: Uncertain at this time  Status is: Inpatient Remains inpatient appropriate because: Uncertain disposition,    Code Status:     Code Status: Full Code  Family Communication: Spoke with the patient's mother at bedside on  07/20/2024.  Consultants: Nephrology Neurosurgery  Procedures: Ventriculoperitoneal shunt placement Hemodialysis  Anti-infectives:  None currently  Subjective: Today, patient was seen and examined at bedside.  Patient complained of some cough.  No chest pain today.  No fever or chills.  Patient's mom at bedside  Objective: Vitals:   07/20/24 0520 07/20/24 0800  BP: (!) 170/73 (!) 145/72  Pulse: 76 78  Resp: 18 18  Temp: 97.6 F (36.4 C) (!) 97.4 F (36.3 C)  SpO2: 100% 100%    Intake/Output Summary (Last 24 hours) at 07/20/2024 1032 Last data filed at 07/20/2024 0700 Gross per 24 hour  Intake 2005 ml  Output --  Net 2005 ml    Filed Weights   07/18/24 0413 07/19/24 0449 07/20/24 0500  Weight: 55.2 kg 55.4 kg 57.8 kg   Body mass index is 22.57 kg/m.   Physical Exam:  General:  Average built, not in obvious distress appears chronically ill and deconditioned. HENT:   No scleral pallor or icterus noted. Oral mucosa is moist.  Chest: Diminished breath sounds bilaterally CVS: S1 &S2 heard. No murmur.  Regular rate and rhythm. Abdomen: Soft, nontender, nondistended.  Bowel sounds are heard.  PEG tube in place Extremities: No cyanosis, clubbing or edema.  Peripheral pulses are palpable.  Right upper extremity AV fistula in place Psych: Alert, awake and oriented, normal mood CNS:  No cranial nerve deficits.  Moves extremities, weakness noted on the extremities Skin: Warm and dry.  No rashes noted.  Data Review: I have personally reviewed the following laboratory data and studies,  CBC: Recent Labs  Lab 07/14/24 0213 07/16/24 0114 07/18/24 0648  WBC 5.9 4.1 3.7*  HGB 7.0* 7.1* 7.5*  HCT 21.2* 21.7* 22.5*  MCV 95.9 97.3 95.3  PLT 160 148* 167   Basic Metabolic Panel: Recent Labs  Lab 07/14/24 0213 07/16/24 0114 07/18/24 0648  NA 128* 130* 130*  K 3.9 4.3 4.2  CL 89* 89* 88*  CO2 26 25 26   GLUCOSE 108* 117* 108*  BUN 59* 48* 54*  CREATININE  4.72* 3.90* 3.74*  CALCIUM  10.1 10.0 10.2  MG 3.0* 2.9*  --   PHOS 2.4* 2.9 3.3   Liver Function Tests: Recent Labs  Lab 07/18/24 0648  ALBUMIN  2.7*   No results for input(s): LIPASE, AMYLASE in the last 168 hours. No results for input(s): AMMONIA in the last 168 hours. Cardiac Enzymes: No results for input(s): CKTOTAL, CKMB, CKMBINDEX, TROPONINI in the last 168 hours. BNP (last 3 results) Recent Labs    02/14/24 0949  BNP 1,486.6*    ProBNP (last 3 results) No results for input(s): PROBNP in the last 8760 hours.  CBG: Recent Labs  Lab 07/19/24 1514 07/19/24 2028 07/20/24 0111 07/20/24 0523 07/20/24 0859  GLUCAP 173* 141* 147* 106* 120*   No results found for this or any previous visit (from the past 240 hours).   Studies: DG Chest 1 View Result Date: 07/19/2024 EXAM: 1 VIEW(S) XRAY OF THE CHEST 07/19/2024 07:53:00 AM COMPARISON: Portable chest 07/04/2024. CLINICAL HISTORY: Chest pain. FINDINGS: LINES, TUBES AND DEVICES: VP shunt tubing superimposes over the medial right chest. LUNGS AND PLEURA: There are mild chronic interstitial changes in the lower lung fields. No focal pneumonia is evident. No vascular congestion is seen. No pleural effusion. No pneumothorax. HEART AND MEDIASTINUM: Cardiomegaly. The mediastinal silhouette is normally outlined. BONES AND SOFT TISSUES: A calcified right axillary lymph node is again noted. No acute osseous abnormality. IMPRESSION: 1. No acute cardiopulmonary abnormality identified. 2. Cardiomegaly without vascular congestion. 3. Stable chest with chronic change. Electronically signed by: Francis Quam  MD 07/19/2024 08:00 AM EST RP Workstation: [id number]      Vernal Alstrom, MD  [name] 07/20/2024  If 7PM-7AM, please contact night-coverage

## 2024-07-21 DIAGNOSIS — I619 Nontraumatic intracerebral hemorrhage, unspecified: Secondary | ICD-10-CM | POA: Diagnosis not present

## 2024-07-21 LAB — CBC
HCT: 23.9 % — ABNORMAL LOW (ref 36.0–46.0)
Hemoglobin: 7.8 g/dL — ABNORMAL LOW (ref 12.0–15.0)
MCH: 31.5 pg (ref 26.0–34.0)
MCHC: 32.6 g/dL (ref 30.0–36.0)
MCV: 96.4 fL (ref 80.0–100.0)
Platelets: 194 K/uL (ref 150–400)
RBC: 2.48 MIL/uL — ABNORMAL LOW (ref 3.87–5.11)
RDW: 15.1 % (ref 11.5–15.5)
WBC: 5.4 K/uL (ref 4.0–10.5)
nRBC: 0 % (ref 0.0–0.2)

## 2024-07-21 LAB — RENAL FUNCTION PANEL
Albumin: 2.8 g/dL — ABNORMAL LOW (ref 3.5–5.0)
Anion gap: 16 — ABNORMAL HIGH (ref 5–15)
BUN: 81 mg/dL — ABNORMAL HIGH (ref 6–20)
CO2: 26 mmol/L (ref 22–32)
Calcium: 10.5 mg/dL — ABNORMAL HIGH (ref 8.9–10.3)
Chloride: 84 mmol/L — ABNORMAL LOW (ref 98–111)
Creatinine, Ser: 4.87 mg/dL — ABNORMAL HIGH (ref 0.44–1.00)
GFR, Estimated: 11 mL/min — ABNORMAL LOW (ref >60)
Glucose, Bld: 122 mg/dL — ABNORMAL HIGH (ref 70–99)
Phosphorus: 3.1 mg/dL (ref 2.5–4.6)
Potassium: 4.7 mmol/L (ref 3.5–5.1)
Sodium: 126 mmol/L — ABNORMAL LOW (ref 135–145)

## 2024-07-21 LAB — GLUCOSE, CAPILLARY
Glucose-Capillary: 121 mg/dL — ABNORMAL HIGH (ref 70–99)
Glucose-Capillary: 114 mg/dL — ABNORMAL HIGH (ref 70–99)
Glucose-Capillary: 85 mg/dL (ref 70–99)
Glucose-Capillary: 171 mg/dL — ABNORMAL HIGH (ref 70–99)

## 2024-07-21 MED ORDER — ONDANSETRON HCL 4 MG PO TABS
8.0000 mg | ORAL_TABLET | Freq: Three times a day (TID) | ORAL | Status: DC | PRN
  Administered 2024-07-21: 8 mg
  Filled 2024-07-21: qty 2

## 2024-07-21 MED ORDER — OMEPRAZOLE 2 MG/ML ORAL SUSPENSION: 20.0000 mg | Freq: Every day | ORAL | Status: DC

## 2024-07-21 MED ORDER — HYDRALAZINE HCL 25 MG PO TABS
25.0000 mg | ORAL_TABLET | Freq: Four times a day (QID) | ORAL | Status: DC | PRN
  Administered 2024-07-21: 25 mg
  Filled 2024-07-21: qty 1

## 2024-07-21 MED ORDER — PANTOPRAZOLE SODIUM 40 MG IV SOLR
40.0000 mg | Freq: Every day | INTRAVENOUS | Status: DC
  Administered 2024-07-21 – 2024-08-09 (×20): 40 mg via INTRAVENOUS
  Filled 2024-07-21 (×21): qty 10

## 2024-07-21 MED ORDER — CLONIDINE HCL 0.1 MG PO TABS
0.1000 mg | ORAL_TABLET | Freq: Every day | ORAL | Status: AC
  Administered 2024-07-22 – 2024-07-23 (×2): 0.1 mg
  Filled 2024-07-21 (×2): qty 1

## 2024-07-21 NOTE — Plan of Care (Signed)
 Attended  dialysis in chair today. Cream applied to sacral area. Mother at bedside   Problem: Education: Goal: Ability to describe self-care measures that may prevent or decrease complications (Diabetes Survival Skills Education) will improve Outcome: Progressing   Problem: Nutritional: Goal: Maintenance of adequate nutrition will improve Outcome: Progressing   Problem: Safety: Goal: Ability to remain free from injury will improve Outcome: Progressing   Problem: Skin Integrity: Goal: Risk for impaired skin integrity will decrease Outcome: Progressing

## 2024-07-21 NOTE — Progress Notes (Signed)
 Physical Therapy Treatment Patient Details Name: [name] [name] MRN: [id number] DOB: [birth year] Today's Date: 07/21/2024   History of Present Illness 42 yo female presents 05/19/24 for left gaze and L side weakness. 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 Intubated 9/15-9/23. EVD 9/15- 10/15; s/p shunt 10/15. S/p laparoscopy with intra-abdominal assistance ventricular-peritoneal shunt placement 10/15. PMH: HTN, ESRD on HD s/p renal and pancreatic transplants, DM, gastroparesis, multiple strokes (ischemic and hemorrhagic) with prepontine SAH, hydrocephalus requiring EVD placement    PT Comments  Pt received in supine and agreeable to session although fatigued from HD earlier. Pt able to tolerate 3 standing trials at EOB with lateral steps and static standing marches. Pt demonstrates increased forward flexion and posterior bias with increased fatigue requiring min A. Pt encouraged to use BSC with staff instead of bedpan for increased OOB activity, RN notified. Pt continues to benefit from PT services to progress toward functional mobility goals.    If plan is discharge home, recommend the following: A lot of help with walking and/or transfers;A lot of help with bathing/dressing/bathroom;Assistance with cooking/housework;Direct supervision/assist for medications management;Direct supervision/assist for financial management;Assist for transportation;Help with stairs or ramp for entrance   Can travel by private vehicle     No  Equipment Recommendations  Hospital bed;Hoyer lift;Wheelchair (measurements PT);Wheelchair cushion (measurements PT);BSC/3in1    Recommendations for Other Services       Precautions / Restrictions Precautions Precautions: Fall Recall of Precautions/Restrictions: Impaired Precaution/Restrictions Comments: PEG Restrictions Weight Bearing  Restrictions Per Provider Order: No     Mobility  Bed Mobility Overal bed mobility: Needs Assistance Bed Mobility: Supine to Sit, Sit to Supine     Supine to sit: Min assist, HOB elevated Sit to supine: Contact guard assist   General bed mobility comments: HOB slightly elevated with min A for trunk elevation    Transfers Overall transfer level: Needs assistance Equipment used: Rolling walker (2 wheels) Transfers: Sit to/from Stand Sit to Stand: Min assist           General transfer comment: From EOB x3 with min A for anterior weight shift and power up. Pt able to lateral step towards HOB with min A for balance and RW management    Ambulation/Gait             Pre-gait activities: static marches General Gait Details: unable to progress due to fatigue   Stairs             Wheelchair Mobility     Tilt Bed    Modified Rankin (Stroke Patients Only) Modified Rankin (Stroke Patients Only) Pre-Morbid Rankin Score: Moderately severe disability Modified Rankin: Severe disability     Balance Overall balance assessment: Needs assistance Sitting-balance support: Bilateral upper extremity supported, Feet supported Sitting balance-Leahy Scale: Fair Sitting balance - Comments: CGA sitting . slight L lateral lean with fatigue   Standing balance support: Bilateral upper extremity supported, During functional activity Standing balance-Leahy Scale: Poor Standing balance comment: reliant on RW                            Communication Communication Communication: Impaired Factors Affecting Communication: Difficulty expressing self;Reduced clarity of speech  Cognition Arousal: Alert Behavior During Therapy: WFL for tasks assessed/performed   PT - Cognitive impairments: Awareness, Memory, Attention, Safety/Judgement, Problem solving, Orientation  Following commands: Impaired Following commands impaired: Follows one  step commands with increased time    Cueing Cueing Techniques: Verbal cues, Gestural cues, Tactile cues, Visual cues  Exercises Other Exercises Other Exercises: static standing marches with RW support    General Comments        Pertinent Vitals/Pain Pain Assessment Pain Assessment: No/denies pain     PT Goals (current goals can now be found in the care plan section) Acute Rehab PT Goals Patient Stated Goal: unable to state goal PT Goal Formulation: Patient unable to participate in goal setting Time For Goal Achievement: 07/24/24 Progress towards PT goals: Progressing toward goals    Frequency    Min 2X/week       AM-PAC PT 6 Clicks Mobility   Outcome Measure  Help needed turning from your back to your side while in a flat bed without using bedrails?: A Little Help needed moving from lying on your back to sitting on the side of a flat bed without using bedrails?: A Little Help needed moving to and from a bed to a chair (including a wheelchair)?: A Little Help needed standing up from a chair using your arms (e.g., wheelchair or bedside chair)?: A Little Help needed to walk in hospital room?: Total Help needed climbing 3-5 steps with a railing? : Total 6 Click Score: 14    End of Session Equipment Utilized During Treatment: Gait belt Activity Tolerance: Patient limited by fatigue Patient left: with call bell/phone within reach;with family/visitor present;in bed;with bed alarm set Nurse Communication: Mobility status PT Visit Diagnosis: Unsteadiness on feet (R26.81);Other abnormalities of gait and mobility (R26.89);Muscle weakness (generalized) (M62.81);Difficulty in walking, not elsewhere classified (R26.2);Other symptoms and signs involving the nervous system (R29.898)     Time: 8485-8464 PT Time Calculation (min) (ACUTE ONLY): 21 min  Charges:    $Therapeutic Activity: 8-22 mins PT General Charges $$ ACUTE PT VISIT: 1 Visit                    Darryle George, PTA Acute Rehabilitation Services Secure Chat Preferred  [phone]    Darryle George 07/21/2024, 3:52 PM

## 2024-07-21 NOTE — Progress Notes (Signed)
  Received patient in bed to unit.   Informed consent signed and in chart.    TX duration:3.5     Transported by  Hand-off given to patient's nurse.    Access used: rt AVF Access issues: None   Total UF removed:  2000 Medication(s) given: None Post HD VS: 93/55   Hunter Hacking  LPN [facility]

## 2024-07-21 NOTE — Progress Notes (Signed)
 [facility] Progress Note   Subjective:  Seen in [facility]. On dialysis in recliner. Comfortable this am.   Objective Vitals:   07/21/24 0534 07/21/24 0800 07/21/24 0830 07/21/24 0845  BP: (!) 194/84 (!) (P) 140/55 (!) 131/57 (!) 131/58  Pulse:  (P) 80 76 74  Resp:  (P) 20 (!) 22 14  Temp:  (P) 98.2 F (36.8 C) 98.6 F (37 C)   TempSrc:  (P) Oral Oral   SpO2:  (P) 100% 100% 98%  Weight:   57.7 kg   Height:       Physical Exam General: alert female in NAD Heart: RRR, no murmurs, rubs or gallops Lungs: CTA bilaterally, respirations unlabored Abdomen: Soft, non-distended Extremities: no edema b/l lower extremities Dialysis Access: AVF + t/b  Additional Objective Labs: Basic Metabolic Panel: Recent Labs  Lab 07/16/24 0114 07/18/24 0648  NA 130* 130*  K 4.3 4.2  CL 89* 88*  CO2 25 26  GLUCOSE 117* 108*  BUN 48* 54*  CREATININE 3.90* 3.74*  CALCIUM  10.0 10.2  PHOS 2.9 3.3   Liver Function Tests: Recent Labs  Lab 07/18/24 0648  ALBUMIN  2.7*   No results for input(s): LIPASE, AMYLASE in the last 168 hours. CBC: Recent Labs  Lab 07/16/24 0114 07/18/24 0648  WBC 4.1 3.7*  HGB 7.1* 7.5*  HCT 21.7* 22.5*  MCV 97.3 95.3  PLT 148* 167   Blood Culture    Component Value Date/Time   SDES CSF 06/11/2024 0920   SPECREQUEST LP 06/11/2024 0920   CULT  06/11/2024 0920    NO GROWTH 3 DAYS Performed at [facility], [address] [place], [name] [address]    REPTSTATUS 06/14/2024 FINAL 06/11/2024 0920    Cardiac Enzymes: No results for input(s): CKTOTAL, CKMB, CKMBINDEX, TROPONINI in the last 168 hours. CBG: Recent Labs  Lab 07/20/24 1550 07/20/24 2004 07/20/24 2350 07/21/24 0346 07/21/24 0723  GLUCAP 175* 141* 127* 121* 114*   Iron  Studies: No results for input(s): IRON , TIBC, TRANSFERRIN, FERRITIN in the last 72 hours. @lablastinr3 @ Studies/Results: No results found.  Medications:   carvedilol   25 mg Per  Tube BID WC   Chlorhexidine  Gluconate Cloth  6 each Topical Q0600   cinacalcet   30 mg Oral Q M,W,F   cloNIDine   0.1 mg Oral Daily   cycloSPORINE   200 mg Per Tube QPM   cycloSPORINE   225 mg Per Tube Daily   darbepoetin (ARANESP ) injection - DIALYSIS  200 mcg Subcutaneous Q Thu-1800   famotidine   10 mg Per Tube QHS   feeding supplement (NEPRO CARB STEADY)  1,000 mL Per Tube Q24H   fiber supplement (BANATROL TF)  60 mL Per Tube TID   gabapentin   100 mg Per Tube Daily   Gerhardt's butt cream   Topical BID   lacosamide   100 mg Per Tube BID   liver oil-zinc  oxide   Topical TID   losartan   25 mg Per Tube Q1200   multivitamin with minerals  1 tablet Per Tube Daily   mycophenolate   500 mg Per Tube BID   mouth rinse  15 mL Mouth Rinse 4 times per day   oxidized cellulose  1 each Topical Once   predniSONE   15 mg Per Tube Q breakfast    Dialysis Orders: 3:45hr, 400/A1.5, EDW 55.2kg, 2K/2Ca bath, AVF, no heparin  - Prev on Mircera 150mcg IV q 2 weeks - No VDRA  Assessment/Plan: Acute R intracranial hemorrhage: 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 Needs  ongoing rehab. ESRD: Continue HD on MWF schedule HTN/volume: BP much better - actually had some hypotension with HD. UF as tolerated. Off amlodipine , now on Coreg  25mg  BID. Losartan  started this week and clonidine  down to 0.1mg  daily- taper off as able. Anemia of ESRD: Hgb 7s, transfuse prn. Continue Aranesp  q Thurs (dose ^d on 11/13) Secondary HPTH: CorrCa high, Phos low. Continue sensipar , not on VDRA. Nutrition: Alb low, continue Nepro TF. Consider repeat swallowing study in future. 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 Buttocks wound/yeast: Using antifungal powder, improved. Dispo: Insurance declined LTAC, and SNF, appeal pending. Tolerated HD in recliner on 11/7, 11/14.  Maisie Ronnald Acosta PA-C [facility] 07/21/2024,8:54 AM

## 2024-07-21 NOTE — Progress Notes (Signed)
 Nutrition Follow-up  DOCUMENTATION CODES:   Non-severe (moderate) malnutrition in context of chronic illness  INTERVENTION:  Continue GJ-TF Nepro at 45 mL/hr goal. Continue minimal water  flushes for persistent hyponatremia. This enteral feeding regimen provides 1080 mL formula, 1944 Kcals, 87 g protein, 173 g carbohydrates, and 785 mL free water  daily, which meets 100% of the patient's estimated nutrition needs. Continue Banatrol TID for diarrhea. Continue daily multivitamin via feeding tube.   NUTRITION DIAGNOSIS:   Moderate Malnutrition related to chronic illness (ESRD on HD/DM and gastroparesis) as evidenced by severe muscle depletion, mild fat depletion. - remains applicable   GOAL:   Patient will meet greater than or equal to 90% of their needs - currently being met with TF, continue to monitor   MONITOR:   Weight trends, TF tolerance, Skin, Labs, I & O's  REASON FOR ASSESSMENT:   Follow-up for: Consult Enteral/tube feeding initiation and management (patient not tolerating TF)  ASSESSMENT:   43 year old female who presented as a Code Stroke with IVH. 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 Recent admissions 6/11-7/28 and 7/29-8/13 for ICH with GJ tube.  6/11 - 7/29: Hospital admission, discharge to [facility] 8/12 - G tube converted to GJ  9/16 - Admitted, EVD placed, intubated 9/21 - switched to Nepro formula; FMS placed 9/22 - Extubated  9/23 - adjusted rate of Nepro to meet needs (was previously exceeding) 9/25 - D/C Nepro, Started Osmolite 1.5 10/3 - Renal changed TF to Nepro due to higher K (likely due to no HD over weekend) 10/15 - s/p conversion from EVD to VP shunt  10/16 - Transferred to PCU 11/06 - Received consult for patient not tolerating TF. RN notes green emesis yesterday morning,  Zofran  was given and patient verbalized that nausea resolved. No signs of aspiration or respiratory distress per RN with +bowel sounds and +BM. TF was paused and resumed at trickle rate of 20 mL/hr later in the evening. D/W RN - the patient has been tolerating TF and provider has given OK to increase slowly back up to goal. Type 6 BMs noted. 11/11 - [employer] fiber changed to Banatrol TID  Update: the patient continues to tolerate TF at goal. HD ongoing with MWF schedule.  Scheduled Meds:  carvedilol   25 mg Per Tube BID WC   Chlorhexidine  Gluconate Cloth  6 each Topical Q0600   cinacalcet   30 mg Oral Q M,W,F   cloNIDine   0.1 mg Oral Daily   cycloSPORINE   200 mg Per Tube QPM   cycloSPORINE   225 mg Per Tube Daily   darbepoetin (ARANESP ) injection - DIALYSIS  200 mcg Subcutaneous Q Thu-1800   famotidine   10 mg Per Tube QHS   feeding supplement (NEPRO CARB STEADY)  1,000 mL Per Tube Q24H   fiber supplement (BANATROL TF)  60 mL Per Tube TID   gabapentin   100 mg Per Tube Daily   Gerhardt's butt cream   Topical BID   lacosamide   100 mg Per Tube BID   liver oil-zinc  oxide   Topical TID   losartan   25 mg Per Tube Q1200   multivitamin with minerals  1 tablet Per Tube Daily   mycophenolate   500 mg Per Tube BID   mouth rinse  15 mL Mouth Rinse 4 times per day   oxidized cellulose  1 each Topical Once   predniSONE   15 mg  Per Tube Q breakfast    Diet Order     None      Meal Intake: N/A  Labs:     Latest Ref Rng & Units 07/21/2024    9:06 AM 07/18/2024    6:48 AM 07/16/2024    1:14 AM  CMP  Glucose 70 - 99 mg/dL 877  891  882   BUN 6 - 20 mg/dL 81  54  48   Creatinine 0.44 - 1.00 mg/dL 5.12  6.25  6.09   Sodium 135 - 145 mmol/L 126  130  130   Potassium 3.5 - 5.1 mmol/L 4.7  4.2  4.3   Chloride 98 - 111 mmol/L 84  88  89   CO2 22 - 32 mmol/L 26  26  25    Calcium  8.9 - 10.3 mg/dL 89.4  89.7  89.9   K and Phos remain WNL   I/O: +13 L since admit  NUTRITION - FOCUSED  PHYSICAL EXAM:  Flowsheet Row Most Recent Value  Orbital Region Mild depletion  Upper Arm Region Moderate depletion  Thoracic and Lumbar Region Mild depletion  Buccal Region Mild depletion  Temple Region Severe depletion  Clavicle Bone Region Severe depletion  Clavicle and Acromion Bone Region Severe depletion  Scapular Bone Region Severe depletion  Dorsal Hand Moderate depletion  Patellar Region Severe depletion  Anterior Thigh Region Severe depletion  Posterior Calf Region Severe depletion  Edema (RD Assessment) Mild  Hair Reviewed  Eyes Unable to assess  Mouth Unable to assess  Skin Reviewed  [Closed surgical incision on head]  Nails Reviewed    EDUCATION NEEDS:   Not appropriate for education at this time  Skin:  Skin Assessment: Skin Integrity Issues: Skin Integrity Issues:: Incisions, Other (Comment) Stage I: buttocks - resolved Other: contact dermatitis buttocks  Last BM:  11/15 type 6  Height:   Ht Readings from Last 1 Encounters:  06/18/24 5' 3 (1.6 m)    Weight:   Weight change: stable weight noted  Ideal Body Weight:  52.3 kg  BMI:  Body mass index is 22.53 kg/m.  Estimated Nutritional Needs:  Kcal:  1700-1900 Protein:  80-100 grams Fluid:  1L +UOP    Leverne Ruth, MS, RDN, LDN [place]. [facility] See AMION for contact information

## 2024-07-21 NOTE — Progress Notes (Signed)
 PROGRESS NOTE  [name] [name] FMW:[id number] DOB: [birth year] DOA: 05/19/2024 PCP: [name]   LOS: 63 days   Brief narrative:  Patient is a  43-yrs-old female with past medical history of hypertension, CVA, type 1 diabetes, gastroparesis, kidney and pancreatic transplant 2014 with subsequent failure of renal transplant now on end-stage renal dialysis, anxiety, narcotic abuse presented to the hospital from dialysis unit on 05/19/2024 as code stroke.  Patient was noted to have hemorrhagic stroke.  Neurology neurosurgery in critical care were consulted.  EVD was placed on the left side and was initially admitted to neuro ICU.  She was also intubated and had prolonged intubation.  Subsequently, patient was extubated on 05/27/2024.  Important events:  9/15 admitted with ICH plus IVH, EVD was placed for hydrocephalus, intubated 9/22 palliative care meeting.  Family is insistent that they want full scope of care 9/23 Extubated 10/1 -developed fevers pancultures drawn and broad-spectrum antibiotics started 10/2 became afebrile after starting antibiotics, did not tolerate raising EVD 10/6 EVD was raised from 10 to 30 cm water , tolerating well, remained afebrile 10/7 EVD is at 30 cm water , CT scan will be done tomorrow morning, no overnight issues 10/8 she became somnolent, CT head showed increasing hydrocephalus, EVD was titrated down to 10 cm of water  with improvement in mental status 10/15 VP shunt placement 10/17 transferred to progressive unit with [name]   Assessment/Plan: Principal Problem:   Hemorrhagic stroke [facility]) Active Problems:   Moderate protein-calorie malnutrition   Pressure injury of skin   Nontraumatic subcortical hemorrhage of right cerebral hemisphere [facility])  Acute right parietotemporal intraparenchymal hemorrhage / Intraventricular hemorrhage: Uncontrolled hypertension: Obstructive hydrocephalus s/p EVD changed to VP shunt: Seizure disorder S/p VP shunt  placement on 06/18/2024 by Dr. Mavis.  Continue Vimpat .  Neurosurgery intermittently following.   ESRD on dialysis: History of failed renal transplant: Secondary hyperparathyroidism: Nephrology on board.  Continue CellCept  and prednisone .  On Monday, Wednesday, Friday hemodialysis schedule.  TOC working on outpatient hemodialysis set up  Acute bronchitis: Resolved Thought to be secondary to intracranial hemorrhage. Previous blood cultures showed contamination.  S/p antibiotics for respiratory cultures positive for Serratia and Klebsiella.  Repeat culture negative.  Afebrile.  Temperature max of 98.6 F.   History of pancreatic/kidney transplantation:  Continue cyclosporine , mycophenolate , and prednisone .   Hypertension:  Currently on Coreg , amlodipine  and clonidine  on nondialysis days.  Latest blood pressure at 90/57 during hemodialysis   Anemia of chronic disease:  Latest hemoglobin of 7.8 .Transfuse if Hb below 7.0   Type 1 diabetes with vasculopathy/gastroparesis:  Not on long-term insulin  anymore due to pancreatic transplant. On sliding scale at this time.   Moderate protein calorie malnutrition/dysphagia:  On PEG tube feeding.   Stage I sacral pressure ulcer present on admission: Continue wound care   Debility/deconditioning/disposition/goals of care:  Palliative care had seen the patient during hospitalization.  Family has insisted full code.   Palliative care has signed off 10/17.  Disposition pending.  TOC on board.  Cough with some chest pain.  Chest x-ray done today was negative.  Currently comfortable.  Likely musculoskeletal nonspecific.  Patient likely has some GERD as well and cough is more in the nighttime.  DVT prophylaxis: SCDs Start: 05/19/24 1724   Disposition: Uncertain at this time  Status is: Inpatient Remains inpatient appropriate because: Uncertain disposition,    Code Status:     Code Status: Full Code  Family Communication:  Spoke with the  patient's mother at bedside on 07/21/2024.  Consultants: Nephrology Neurosurgery  Procedures: Ventriculoperitoneal shunt placement Hemodialysis  Anti-infectives:  None currently   Subjective: Today, patient was seen and examined at bedside.  Patient seen during hemodialysis.  Appears comfortable.  Had some cough as per the patient's mom at bedside.  Objective: Vitals:   07/21/24 1100 07/21/24 1130  BP: (!) 90/57   Pulse: 80 73  Resp: 12 17  Temp:    SpO2: 96% 100%    Intake/Output Summary (Last 24 hours) at 07/21/2024 1150 Last data filed at 07/21/2024 0700 Gross per 24 hour  Intake 1080 ml  Output --  Net 1080 ml    Filed Weights   07/20/24 0500 07/21/24 0500 07/21/24 0830  Weight: 57.8 kg 57.7 kg 57.7 kg   Body mass index is 22.53 kg/m.   Physical Exam:  General:  Average built, not in obvious distress appears chronically ill and deconditioned. HENT:   No scleral pallor or icterus noted. Oral mucosa is moist.  Chest: Diminished breath sounds bilaterally. CVS: S1 &S2 heard. No murmur.  Regular rate and rhythm. Abdomen: Soft, nontender, nondistended.  Bowel sounds are heard.  PEG tube in place Extremities: No cyanosis, clubbing or edema.  Peripheral pulses are palpable.  Right upper extremity AV fistula in place Psych: Alert, awake and oriented, normal mood CNS:  No cranial nerve deficits.  Moves extremities, weakness noted on the extremities Skin: Warm and dry.  No rashes noted.  Data Review: I have personally reviewed the following laboratory data and studies,  CBC: Recent Labs  Lab 07/16/24 0114 07/18/24 0648 07/21/24 0907  WBC 4.1 3.7* 5.4  HGB 7.1* 7.5* 7.8*  HCT 21.7* 22.5* 23.9*  MCV 97.3 95.3 96.4  PLT 148* 167 194   Basic Metabolic Panel: Recent Labs  Lab 07/16/24 0114 07/18/24 0648 07/21/24 0906  NA 130* 130* 126*  K 4.3 4.2 4.7  CL 89* 88* 84*  CO2 25 26 26   GLUCOSE 117* 108* 122*  BUN 48* 54* 81*  CREATININE 3.90* 3.74*  4.87*  CALCIUM  10.0 10.2 10.5*  MG 2.9*  --   --   PHOS 2.9 3.3 3.1   Liver Function Tests: Recent Labs  Lab 07/18/24 0648 07/21/24 0906  ALBUMIN  2.7* 2.8*   No results for input(s): LIPASE, AMYLASE in the last 168 hours. No results for input(s): AMMONIA in the last 168 hours. Cardiac Enzymes: No results for input(s): CKTOTAL, CKMB, CKMBINDEX, TROPONINI in the last 168 hours. BNP (last 3 results) Recent Labs    02/14/24 0949  BNP 1,486.6*    ProBNP (last 3 results) No results for input(s): PROBNP in the last 8760 hours.  CBG: Recent Labs  Lab 07/20/24 1550 07/20/24 2004 07/20/24 2350 07/21/24 0346 07/21/24 0723  GLUCAP 175* 141* 127* 121* 114*   No results found for this or any previous visit (from the past 240 hours).   Studies: No results found.     Vernal Alstrom, MD  [name] 07/21/2024  If 7PM-7AM, please contact night-coverage

## 2024-07-22 DIAGNOSIS — I619 Nontraumatic intracerebral hemorrhage, unspecified: Secondary | ICD-10-CM | POA: Diagnosis not present

## 2024-07-22 LAB — GLUCOSE, CAPILLARY
Glucose-Capillary: 109 mg/dL — ABNORMAL HIGH (ref 70–99)
Glucose-Capillary: 161 mg/dL — ABNORMAL HIGH (ref 70–99)
Glucose-Capillary: 99 mg/dL (ref 70–99)

## 2024-07-22 NOTE — Progress Notes (Signed)
 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  Patient was noted to have hemorrhagic stroke.  Neurology neurosurgery in critical care were consulted.  EVD was placed on the left side and was initially admitted to neuro ICU.  She was also intubated and had prolonged intubation.  Subsequently, patient was extubated on 05/27/2024.  Important events:  9/15 admitted with ICH plus IVH, EVD was placed for hydrocephalus, intubated 9/22 palliative care meeting.  Family is insistent that they want full scope of care 9/23 Extubated 10/1 -developed fevers pancultures drawn and broad-spectrum antibiotics started 10/2 became afebrile after starting antibiotics, did not tolerate raising EVD 10/6 EVD was raised from 10 to 30 cm water , tolerating well, remained afebrile 10/7 EVD is at 30 cm water , CT scan will be done tomorrow morning, no overnight issues 10/8 she became somnolent, CT head showed increasing hydrocephalus, EVD was titrated down to 10 cm of water  with improvement in mental status 10/15 VP shunt placement 10/17 transferred to progressive unit with [name]  11/18-medically stable.  Awaiting for disposition.  Difficult disposition.  Assessment/Plan: Principal Problem:   Hemorrhagic stroke [facility]) Active Problems:   Moderate protein-calorie malnutrition   Pressure injury of skin   Nontraumatic subcortical hemorrhage of right cerebral hemisphere [facility])  Acute right parietotemporal intraparenchymal hemorrhage / Intraventricular hemorrhage: Uncontrolled hypertension: Obstructive  hydrocephalus s/p EVD changed to VP shunt: Seizure disorder S/p VP shunt placement on 06/18/2024 by Dr. Mavis.  Continue Vimpat .  Neurosurgery intermittently following.  Currently stable   ESRD on dialysis: History of failed renal transplant: Secondary hyperparathyroidism: Nephrology on board.  Continue CellCept  and prednisone .  On Monday, Wednesday, Friday hemodialysis schedule.  TOC working on outpatient hemodialysis set up  Acute bronchitis: Resolved Fever. Thought to be secondary to intracranial hemorrhage. Previous blood cultures showed contamination.  Repeat blood cultures negative in 5 days.  Afebrile.  Temperature max of 98.7 F.  CSF culture was negative in 3 days.   History of pancreatic/kidney transplantation:  Continue cyclosporine , mycophenolate , and prednisone .   Hypertension:  Currently on Coreg , amlodipine  and clonidine  on nondialysis days   Anemia of chronic disease:  Latest hemoglobin of 7.8 .Transfuse if Hb below 7.0   Type 1 diabetes with vasculopathy/gastroparesis:  Not on long-term insulin  anymore due to pancreatic transplant. On sliding scale at this time.   Moderate protein calorie malnutrition/dysphagia:  On PEG tube feeding.   Stage I sacral pressure ulcer present on admission: Continue wound care   Debility/deconditioning/disposition/goals of care:  Palliative care had seen the patient during hospitalization.  Family has insisted full code.   Palliative care has signed off 10/17.  Disposition pending.  TOC on board.  Cough with some chest pain.  Chest x-ray done negative.  Likely has some GERD issues.  Protonix  IV has been initiated with improvement in her cough symptoms.   DVT prophylaxis: SCDs Start: 05/19/24 1724   Disposition: Long-term care/skilled nursing facility.  Status is: Inpatient Remains inpatient appropriate because: Uncertain disposition, TOC on board    Code Status:     Code Status: Full Code  Family Communication:  Spoke  with the patient's mother at  bedside on 07/22/2024.  Consultants: Nephrology Neurosurgery  Procedures: Ventriculoperitoneal shunt placement Hemodialysis  Anti-infectives:  None currently   Subjective: Today, patient was seen and examined at bedside.  Patient feels little better with cough today.  Patient's mother at bedside.  Denies any  vomiting abdominal pain fever chills or rigor.  Has mild nausea  Objective: Vitals:   07/22/24 0343 07/22/24 0839  BP: (!) 169/68 (!) 162/69  Pulse: 77 82  Resp:  18  Temp: 98.5 F (36.9 C) 98.7 F (37.1 C)  SpO2: 100%     Intake/Output Summary (Last 24 hours) at 07/22/2024 0859 Last data filed at 07/22/2024 0700 Gross per 24 hour  Intake 1080 ml  Output 2000 ml  Net -920 ml    Filed Weights   07/21/24 0500 07/21/24 0830 07/22/24 0500  Weight: 57.7 kg 57.7 kg 58.2 kg   Body mass index is 22.73 kg/m.   Physical Exam:  General:  Average built, not in obvious distress, chronically ill and deconditioned. HENT:   No scleral pallor or icterus noted. Oral mucosa is moist.  Chest:    Diminished breath sounds bilaterally. No crackles or wheezes.  CVS: S1 &S2 heard. No murmur.  Regular rate and rhythm. Abdomen: Soft, nontender, nondistended.  Bowel sounds are heard.  PEG tube in place. Extremities: No cyanosis, clubbing or edema.  Peripheral pulses are palpable.  Right upper extremity AV fistula in place. Psych: Alert, awake and oriented, Communicative CNS:  No cranial nerve deficits.  Moves extremities, weakness noted in the lower extremities. Skin: Warm and dry.  No rashes noted.   Data Review: I have personally reviewed the following laboratory data and studies,  CBC: Recent Labs  Lab 07/16/24 0114 07/18/24 0648 07/21/24 0907  WBC 4.1 3.7* 5.4  HGB 7.1* 7.5* 7.8*  HCT 21.7* 22.5* 23.9*  MCV 97.3 95.3 96.4  PLT 148* 167 194   Basic Metabolic Panel: Recent Labs  Lab 07/16/24 0114 07/18/24 0648 07/21/24 0906  NA  130* 130* 126*  K 4.3 4.2 4.7  CL 89* 88* 84*  CO2 25 26 26   GLUCOSE 117* 108* 122*  BUN 48* 54* 81*  CREATININE 3.90* 3.74* 4.87*  CALCIUM  10.0 10.2 10.5*  MG 2.9*  --   --   PHOS 2.9 3.3 3.1   Liver Function Tests: Recent Labs  Lab 07/18/24 0648 07/21/24 0906  ALBUMIN  2.7* 2.8*   No results for input(s): LIPASE, AMYLASE in the last 168 hours. No results for input(s): AMMONIA in the last 168 hours. Cardiac Enzymes: No results for input(s): CKTOTAL, CKMB, CKMBINDEX, TROPONINI in the last 168 hours. BNP (last 3 results) Recent Labs    02/14/24 0949  BNP 1,486.6*    ProBNP (last 3 results) No results for input(s): PROBNP in the last 8760 hours.  CBG: Recent Labs  Lab 07/21/24 1625 07/21/24 2042 07/22/24 0002 07/22/24 0413 07/22/24 0731  GLUCAP 85 171* 161* 99 109*   No results found for this or any previous visit (from the past 240 hours).   Studies: No results found.     Vernal Alstrom, MD  [name] 07/22/2024  If 7PM-7AM, please contact night-coverage

## 2024-07-22 NOTE — Progress Notes (Signed)
 Occupational Therapy Treatment Patient Details Name: [name] [name] MRN: [id number] DOB: 27-Mar-1981 Today's Date: 07/22/2024   History of present illness 43 yo female presents 05/19/24 for left gaze and L side weakness. 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 Intubated 9/15-9/23. EVD 9/15- 10/15; s/p shunt 10/15. S/p laparoscopy with intra-abdominal assistance ventricular-peritoneal shunt placement 10/15. PMH: HTN, ESRD on HD s/p renal and pancreatic transplants, DM, gastroparesis, multiple strokes (ischemic and hemorrhagic) with prepontine SAH, hydrocephalus requiring EVD placement   OT comments  Pt with good progression toward established OT goals with 5/6 goals upgraded this session. Pt eager to perform UE HEP this session, so began session with UE HEP working on UE strength, cognition, and sequencing. Pt needing cues for optimal body mechanics and return demo of each exercise. Pt able to count 5 reps with min error each exercise. Engaged in STS transfers, standing marching, and short functional ambulation in room per family present wishes. Pt following up to 2 step simple commands. Will continue to follow.       If plan is discharge home, recommend the following:  Assistance with cooking/housework;Assist for transportation;Help with stairs or ramp for entrance;A lot of help with walking and/or transfers;A lot of help with bathing/dressing/bathroom   Equipment Recommendations  Other (comment) (defer)    Recommendations for Other Services      Precautions / Restrictions Precautions Precautions: Fall Recall of Precautions/Restrictions: Impaired Precaution/Restrictions Comments: PEG Restrictions Weight Bearing Restrictions Per Provider Order: No       Mobility Bed Mobility Overal bed mobility: Needs Assistance Bed Mobility: Supine to Sit, Sit to Supine      Supine to sit: Min assist, HOB elevated Sit to supine: Contact guard assist        Transfers Overall transfer level: Needs assistance Equipment used: Rolling walker (2 wheels) Transfers: Sit to/from Stand Sit to Stand: Min assist           General transfer comment: up from EOb x4 with cues for hand placement. Min A to rise and steady     Balance Overall balance assessment: Needs assistance Sitting-balance support: Bilateral upper extremity supported, Feet supported Sitting balance-Leahy Scale: Fair     Standing balance support: Bilateral upper extremity supported, During functional activity Standing balance-Leahy Scale: Poor Standing balance comment: reliant on RW and external support                           ADL either performed or assessed with clinical judgement   ADL Overall ADL's : Needs assistance/impaired                     Lower Body Dressing: Minimal assistance;Sitting/lateral leans Lower Body Dressing Details (indicate cue type and reason): to don socks Toilet Transfer: Minimal assistance;Stand-pivot;Rolling walker (2 wheels)           Functional mobility during ADLs: Minimal assistance;Rolling walker (2 wheels) General ADL Comments: mother present and supportive    Extremity/Trunk Assessment              Vision       Perception     Praxis     Communication Communication Communication: Impaired Factors Affecting Communication: Difficulty expressing self;Reduced clarity of speech (improving)   Cognition Arousal: Alert Behavior During Therapy: WFL for tasks assessed/performed Cognition: Cognition impaired   Orientation impairments:  (not formally assessed  this session) Awareness: Intellectual awareness impaired Memory impairment (select all impairments): Short-term memory Attention impairment (select first level of impairment): Sustained attention Executive functioning impairment (select all impairments):  Initiation, Organization, Sequencing, Problem solving OT - Cognition Comments: follows one step commands with up to min cues as well as up to simple 2 step commands with up to min cues. much more conversational this session as compared to prior sessions with this OT                 Following commands: Impaired Following commands impaired: Follows one step commands with increased time, Follows multi-step commands inconsistently      Cueing   Cueing Techniques: Verbal cues, Gestural cues, Tactile cues, Visual cues  Exercises Exercises: General Upper Extremity, Other exercises General Exercises - Upper Extremity Elbow Flexion: AROM, Theraband, Right, Left, 5 reps Theraband Level (Elbow Flexion): Level 1 (Yellow) Other Exercises Other Exercises: static standing marches with 6in+ foot clearance from floor 5x ea LE Other Exercises: seated row with yellow theraband 8x ea UE; cues for technique with mother behind guarding pt as pt with tendency to attempt to lean trunk back and forward Other Exercises: BUE external rotation with yellow theraband 2x 5 reps    Shoulder Instructions       General Comments      Pertinent Vitals/ Pain       Pain Assessment Pain Assessment: Faces Faces Pain Scale: Hurts little more Pain Location: bottom Pain Descriptors / Indicators: Discomfort, Grimacing, Guarding Pain Intervention(s): Limited activity within patient's tolerance, Monitored during session  Home Living                                          Prior Functioning/Environment              Frequency  Min 2X/week        Progress Toward Goals  OT Goals(current goals can now be found in the care plan section)  Progress towards OT goals: Progressing toward goals  Acute Rehab OT Goals OT Goal Formulation: With patient Time For Goal Achievement: 08/05/24 Potential to Achieve Goals: Good  Plan      Co-evaluation                 AM-PAC OT 6 Clicks  Daily Activity     Outcome Measure   Help from another person eating meals?: Total Help from another person taking care of personal grooming?: A Lot Help from another person toileting, which includes using toliet, bedpan, or urinal?: Total Help from another person bathing (including washing, rinsing, drying)?: A Lot Help from another person to put on and taking off regular upper body clothing?: A Lot Help from another person to put on and taking off regular lower body clothing?: A Lot 6 Click Score: 10    End of Session Equipment Utilized During Treatment: Gait belt;Rolling walker (2 wheels)  OT Visit Diagnosis: Unsteadiness on feet (R26.81);Muscle weakness (generalized) (M62.81);History of falling (Z91.81);Other symptoms and signs involving cognitive function;Cognitive communication deficit (R41.841);Low vision, both eyes (H54.2) Symptoms and signs involving cognitive functions: Nontraumatic SAH   Activity Tolerance Patient tolerated treatment well   Patient Left in bed;with family/visitor present;with call bell/phone within reach;with bed alarm set   Nurse Communication Mobility status        Time: 8661-8597 OT Time Calculation (min): 24 min  Charges: OT General Charges $OT Visit:  1 Visit OT Treatments $Therapeutic Activity: 23-37 mins  Elma JONETTA Lebron FREDERICK, OTR/L [facility] Acute Rehabilitation Office: [phone]   Elma JONETTA Lebron 07/22/2024, 4:26 PM

## 2024-07-22 NOTE — Progress Notes (Signed)
 [facility] Progress Note   Subjective:   Completed dialysis yesterday - net UF 2L  Seen in room. Mom at bedside. No complaints.   Objective Vitals:   07/22/24 0116 07/22/24 0343 07/22/24 0500 07/22/24 0839  BP: (!) 141/69 (!) 169/68  (!) 162/69  Pulse: 81 77  82  Resp: 16   18  Temp: 98.6 F (37 C) 98.5 F (36.9 C)  98.7 F (37.1 C)  TempSrc:  Temporal  Oral  SpO2: 100% 100%    Weight:   58.2 kg   Height:       Physical Exam General: alert female in NAD Heart: RRR, no rub  Lungs: CTA bilaterally, respirations unlabored Abdomen: Soft, non-tender  Extremities: no edema b/l lower extremities Dialysis Access: AVF + t/b  Additional Objective Labs: Basic Metabolic Panel: Recent Labs  Lab 07/16/24 0114 07/18/24 0648 07/21/24 0906  NA 130* 130* 126*  K 4.3 4.2 4.7  CL 89* 88* 84*  CO2 25 26 26   GLUCOSE 117* 108* 122*  BUN 48* 54* 81*  CREATININE 3.90* 3.74* 4.87*  CALCIUM  10.0 10.2 10.5*  PHOS 2.9 3.3 3.1   Liver Function Tests: Recent Labs  Lab 07/18/24 0648 07/21/24 0906  ALBUMIN  2.7* 2.8*   No results for input(s): LIPASE, AMYLASE in the last 168 hours. CBC: Recent Labs  Lab 07/16/24 0114 07/18/24 0648 07/21/24 0907  WBC 4.1 3.7* 5.4  HGB 7.1* 7.5* 7.8*  HCT 21.7* 22.5* 23.9*  MCV 97.3 95.3 96.4  PLT 148* 167 194   Blood Culture    Component Value Date/Time   SDES CSF 06/11/2024 0920   SPECREQUEST LP 06/11/2024 0920   CULT  06/11/2024 0920    NO GROWTH 3 DAYS Performed at [facility], [address], [place], [name] [address]    REPTSTATUS 06/14/2024 FINAL 06/11/2024 0920    Cardiac Enzymes: No results for input(s): CKTOTAL, CKMB, CKMBINDEX, TROPONINI in the last 168 hours. CBG: Recent Labs  Lab 07/21/24 1625 07/21/24 2042 07/22/24 0002 07/22/24 0413 07/22/24 0731  GLUCAP 85 171* 161* 99 109*   Iron  Studies: No results for input(s): IRON , TIBC, TRANSFERRIN, FERRITIN in the last 72  hours. @lablastinr3 @ Studies/Results: No results found.  Medications:   carvedilol   25 mg Per Tube BID WC   Chlorhexidine  Gluconate Cloth  6 each Topical Q0600   cinacalcet   30 mg Oral Q M,W,F   cloNIDine   0.1 mg Per Tube Daily   cycloSPORINE   200 mg Per Tube QPM   cycloSPORINE   225 mg Per Tube Daily   darbepoetin (ARANESP ) injection - DIALYSIS  200 mcg Subcutaneous Q Thu-1800   famotidine   10 mg Per Tube QHS   feeding supplement (NEPRO CARB STEADY)  1,000 mL Per Tube Q24H   fiber supplement (BANATROL TF)  60 mL Per Tube TID   gabapentin   100 mg Per Tube Daily   Gerhardt's butt cream   Topical BID   lacosamide   100 mg Per Tube BID   liver oil-zinc  oxide   Topical TID   losartan   25 mg Per Tube Q1200   multivitamin with minerals  1 tablet Per Tube Daily   mycophenolate   500 mg Per Tube BID   mouth rinse  15 mL Mouth Rinse 4 times per day   oxidized cellulose  1 each Topical Once   pantoprazole  (PROTONIX ) IV  40 mg Intravenous Daily   predniSONE   15 mg Per Tube Q breakfast    Dialysis Orders: 3:45hr,  400/A1.5, EDW 55.2kg, 2K/2Ca bath, AVF, no heparin  - Prev on Mircera 150mcg IV q 2 weeks - No VDRA  Assessment/Plan: Acute R intracranial hemorrhage: 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 Needs ongoing rehab. ESRD: Continue HD on MWF schedule HTN/volume: BP much better - actually had some hypotension with HD. UF as tolerated. Off amlodipine , now on Coreg  25mg  bid, Losartan  25 mg, clonidine  down to 0.1mg  daily- taper off as able. Anemia of ESRD: Hgb 7s, transfuse prn. Continue Aranesp  200 q Thurs Secondary HPTH: CorrCa high, Phos low. Continue sensipar , not on VDRA. Nutrition: Alb low, continue Nepro TF. Consider repeat swallowing study in future. 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 Buttocks wound/yeast: Using antifungal powder, improved. Dispo: Insurance declined LTAC, and SNF, appeal pending. Tolerated HD in recliner on multiple days since  11/7.   Maisie Ronnald Acosta PA-C [facility] 07/22/2024,9:27 AM

## 2024-07-22 NOTE — Plan of Care (Signed)
  Problem: Education: Goal: Ability to describe self-care measures that may prevent or decrease complications (Diabetes Survival Skills Education) will improve 07/22/2024 2358 by Kendre Sires K, RN Outcome: Progressing 07/22/2024 2354 by Britiny Defrain K, RN Outcome: Progressing Goal: Individualized Educational Video(s) 07/22/2024 2358 by Chastity Noland K, RN Outcome: Progressing 07/22/2024 2354 by Kamel Haven K, RN Outcome: Progressing   Problem: Coping: Goal: Ability to adjust to condition or change in health will improve 07/22/2024 2358 by Reuben Knoblock K, RN Outcome: Progressing 07/22/2024 2354 by Mysti Haley K, RN Outcome: Progressing

## 2024-07-22 NOTE — Plan of Care (Signed)
   Problem: Education: Goal: Ability to describe self-care measures that may prevent or decrease complications (Diabetes Survival Skills Education) will improve Outcome: Progressing Goal: Individualized Educational Video(s) Outcome: Progressing   Problem: Coping: Goal: Ability to adjust to condition or change in health will improve Outcome: Progressing

## 2024-07-23 DIAGNOSIS — I619 Nontraumatic intracerebral hemorrhage, unspecified: Secondary | ICD-10-CM | POA: Diagnosis not present

## 2024-07-23 LAB — BASIC METABOLIC PANEL WITH GFR
Anion gap: 16 — ABNORMAL HIGH (ref 5–15)
BUN: 58 mg/dL — ABNORMAL HIGH (ref 6–20)
CO2: 25 mmol/L (ref 22–32)
Calcium: 10.7 mg/dL — ABNORMAL HIGH (ref 8.9–10.3)
Chloride: 86 mmol/L — ABNORMAL LOW (ref 98–111)
Creatinine, Ser: 4.07 mg/dL — ABNORMAL HIGH (ref 0.44–1.00)
GFR, Estimated: 13 mL/min — ABNORMAL LOW (ref >60)
Glucose, Bld: 91 mg/dL (ref 70–99)
Potassium: 5.1 mmol/L (ref 3.5–5.1)
Sodium: 127 mmol/L — ABNORMAL LOW (ref 135–145)

## 2024-07-23 LAB — CBC
HCT: 25.9 % — ABNORMAL LOW (ref 36.0–46.0)
Hemoglobin: 8.5 g/dL — ABNORMAL LOW (ref 12.0–15.0)
MCH: 31.7 pg (ref 26.0–34.0)
MCHC: 32.8 g/dL (ref 30.0–36.0)
MCV: 96.6 fL (ref 80.0–100.0)
Platelets: 167 K/uL (ref 150–400)
RBC: 2.68 MIL/uL — ABNORMAL LOW (ref 3.87–5.11)
RDW: 15.6 % — ABNORMAL HIGH (ref 11.5–15.5)
WBC: 3.5 K/uL — ABNORMAL LOW (ref 4.0–10.5)
nRBC: 0 % (ref 0.0–0.2)

## 2024-07-23 LAB — MAGNESIUM: Magnesium: 3.2 mg/dL — ABNORMAL HIGH (ref 1.7–2.4)

## 2024-07-23 LAB — HEPATITIS B SURFACE ANTIGEN: Hepatitis B Surface Ag: NONREACTIVE

## 2024-07-23 MED ORDER — PENTAFLUOROPROP-TETRAFLUOROETH EX AERO: 1.0000 | INHALATION_SPRAY | CUTANEOUS | Status: DC | PRN

## 2024-07-23 MED ORDER — LIDOCAINE-PRILOCAINE 2.5-2.5 % EX CREA: 1.0000 | TOPICAL_CREAM | CUTANEOUS | Status: DC | PRN

## 2024-07-23 MED ORDER — HEPARIN SODIUM (PORCINE) 1000 UNIT/ML DIALYSIS: 1000.0000 [IU] | INTRAMUSCULAR | Status: DC | PRN

## 2024-07-23 MED ORDER — ALTEPLASE 2 MG IJ SOLR: 2.0000 mg | Freq: Once | INTRAMUSCULAR | Status: DC | PRN

## 2024-07-23 MED ORDER — LIDOCAINE HCL (PF) 1 % IJ SOLN: 5.0000 mL | INTRAMUSCULAR | Status: DC | PRN

## 2024-07-23 MED ORDER — ANTICOAGULANT SODIUM CITRATE 4% (200MG/5ML) IV SOLN: 5.0000 mL | Status: DC | PRN

## 2024-07-23 NOTE — Plan of Care (Signed)
 Problem: Education: Goal: Ability to describe self-care measures that may prevent or decrease complications (Diabetes Survival Skills Education) will improve Outcome: Progressing Goal: Individualized Educational Video(s) Outcome: Progressing   Problem: Coping: Goal: Ability to adjust to condition or change in health will improve Outcome: Progressing   Problem: Fluid Volume: Goal: Ability to maintain a balanced intake and output will improve Outcome: Progressing   Problem: Health Behavior/Discharge Planning: Goal: Ability to identify and utilize available resources and services will improve Outcome: Progressing Goal: Ability to manage health-related needs will improve Outcome: Progressing   Problem: Metabolic: Goal: Ability to maintain appropriate glucose levels will improve Outcome: Progressing   Problem: Nutritional: Goal: Maintenance of adequate nutrition will improve Outcome: Progressing Goal: Progress toward achieving an optimal weight will improve Outcome: Progressing   Problem: Skin Integrity: Goal: Risk for impaired skin integrity will decrease Outcome: Progressing   Problem: Tissue Perfusion: Goal: Adequacy of tissue perfusion will improve Outcome: Progressing   Problem: Education: Goal: Knowledge of disease or condition will improve Outcome: Progressing Goal: Knowledge of secondary prevention will improve (MUST DOCUMENT ALL) Outcome: Progressing Goal: Knowledge of patient specific risk factors will improve (DELETE if not current risk factor) Outcome: Progressing   Problem: Intracerebral Hemorrhage Tissue Perfusion: Goal: Complications of Intracerebral Hemorrhage will be minimized Outcome: Progressing   Problem: Coping: Goal: Will verbalize positive feelings about self Outcome: Progressing Goal: Will identify appropriate support needs Outcome: Progressing   Problem: Health Behavior/Discharge Planning: Goal: Ability to manage health-related needs will  improve Outcome: Progressing Goal: Goals will be collaboratively established with patient/family Outcome: Progressing   Problem: Self-Care: Goal: Ability to participate in self-care as condition permits will improve Outcome: Progressing Goal: Verbalization of feelings and concerns over difficulty with self-care will improve Outcome: Progressing Goal: Ability to communicate needs accurately will improve Outcome: Progressing   Problem: Nutrition: Goal: Risk of aspiration will decrease Outcome: Progressing Goal: Dietary intake will improve Outcome: Progressing   Problem: Education: Goal: Knowledge of General Education information will improve Description: Including pain rating scale, medication(s)/side effects and non-pharmacologic comfort measures Outcome: Progressing   Problem: Health Behavior/Discharge Planning: Goal: Ability to manage health-related needs will improve Outcome: Progressing   Problem: Clinical Measurements: Goal: Ability to maintain clinical measurements within normal limits will improve Outcome: Progressing Goal: Will remain free from infection Outcome: Progressing Goal: Diagnostic test results will improve Outcome: Progressing Goal: Respiratory complications will improve Outcome: Progressing Goal: Cardiovascular complication will be avoided Outcome: Progressing   Problem: Activity: Goal: Risk for activity intolerance will decrease Outcome: Progressing   Problem: Nutrition: Goal: Adequate nutrition will be maintained Outcome: Progressing   Problem: Coping: Goal: Level of anxiety will decrease Outcome: Progressing   Problem: Elimination: Goal: Will not experience complications related to bowel motility Outcome: Progressing   Problem: Pain Managment: Goal: General experience of comfort will improve and/or be controlled Outcome: Progressing   Problem: Safety: Goal: Ability to remain free from injury will improve Outcome: Progressing    Problem: Skin Integrity: Goal: Risk for impaired skin integrity will decrease Outcome: Progressing   Problem: Education: Goal: Knowledge of the prescribed therapeutic regimen will improve Outcome: Progressing   Problem: Clinical Measurements: Goal: Usual level of consciousness will be regained or maintained. Outcome: Progressing Goal: Neurologic status will improve Outcome: Progressing Goal: Ability to maintain intracranial pressure will improve Outcome: Progressing   Problem: Skin Integrity: Goal: Demonstration of wound healing without infection will improve Outcome: Progressing   Problem: Education: Goal: Knowledge of disease and its progression will improve Outcome: Progressing  Goal: Individualized Educational Video(s) Outcome: Progressing   Problem: Fluid Volume: Goal: Compliance with measures to maintain balanced fluid volume will improve Outcome: Progressing

## 2024-07-23 NOTE — TOC Progression Note (Signed)
 Transition of Care [facility]) - Progression Note    Patient Details  Name: [name] [name] MRN: [id number] Date of Birth: Apr 12, 1981  Transition of [facility]) CM/SW Contact  [name], [name] Phone Number: 07/23/2024, 1:17 PM  Clinical Narrative:   CSW following. Appeal for SNF denial remains pending at this time. CSW to follow.    Expected Discharge Plan: Skilled Nursing Facility Barriers to Discharge: Continued Medical Work up, English As A Second Language Teacher               Expected Discharge Plan and Services In-house Referral: Clinical Social Work   Post Acute Care Choice: Skilled Nursing Facility Living arrangements for the past 2 months: Apartment                                       Social Drivers of Health (SDOH) Interventions SDOH Screenings   Food Insecurity: Patient Unable To Answer (05/20/2024)  Housing: Unknown (07/14/2024)  Transportation Needs: Patient Unable To Answer (05/20/2024)  Utilities: Patient Unable To Answer (05/20/2024)  Social Connections: Unknown (10/04/2022)   Received from [facility]  Stress: No Stress Concern Present (04/16/2024)   Received from [facility]  Tobacco Use: Medium Risk (06/18/2024)    Readmission Risk Interventions    05/20/2024    4:16 PM 03/31/2024   11:27 AM 11/17/2021   12:15 PM  Readmission Risk Prevention Plan  Transportation Screening Complete Complete Complete  Medication Review Oceanographer) Complete Complete Complete  PCP or Specialist appointment within 3-5 days of discharge Complete  Complete  HRI or Home Care Consult Complete Complete Complete  SW Recovery Care/Counseling Consult Complete Complete Complete  Palliative Care Screening Complete Not Applicable Not Applicable  Skilled Nursing Facility Complete Not Applicable Patient Refused

## 2024-07-23 NOTE — Progress Notes (Signed)
 [facility] Progress Note   Subjective:   Seen in [facility]. On dialysis. Comfortable. Denies sob, cp, n/v  Objective Vitals:   07/23/24 0749 07/23/24 0805 07/23/24 0830 07/23/24 0850  BP: (!) 169/72 (!) 168/66  135/62  Pulse: 75 73 79 81  Resp: 18 14 (!) 5 10  Temp: 98.1 F (36.7 C)     TempSrc:      SpO2: 100% 100% 99% 100%  Weight: 55.6 kg     Height:       Physical Exam General: alert female in NAD Heart: RRR, no rub  Lungs: CTA bilaterally, respirations unlabored Abdomen: Soft, non-tender  Extremities: no edema b/l lower extremities Dialysis Access: AVF + t/b  Additional Objective Labs: Basic Metabolic Panel: Recent Labs  Lab 07/18/24 0648 07/21/24 0906 07/23/24 0455  NA 130* 126* 127*  K 4.2 4.7 5.1  CL 88* 84* 86*  CO2 26 26 25   GLUCOSE 108* 122* 91  BUN 54* 81* 58*  CREATININE 3.74* 4.87* 4.07*  CALCIUM  10.2 10.5* 10.7*  PHOS 3.3 3.1  --    Liver Function Tests: Recent Labs  Lab 07/18/24 0648 07/21/24 0906  ALBUMIN  2.7* 2.8*   No results for input(s): LIPASE, AMYLASE in the last 168 hours. CBC: Recent Labs  Lab 07/18/24 0648 07/21/24 0907 07/23/24 0455  WBC 3.7* 5.4 3.5*  HGB 7.5* 7.8* 8.5*  HCT 22.5* 23.9* 25.9*  MCV 95.3 96.4 96.6  PLT 167 194 167   Blood Culture    Component Value Date/Time   SDES CSF 06/11/2024 0920   SPECREQUEST LP 06/11/2024 0920   CULT  06/11/2024 0920    NO GROWTH 3 DAYS Performed at [facility], [address], [place], [name] [address]    REPTSTATUS 06/14/2024 FINAL 06/11/2024 0920    Cardiac Enzymes: No results for input(s): CKTOTAL, CKMB, CKMBINDEX, TROPONINI in the last 168 hours. CBG: Recent Labs  Lab 07/21/24 1625 07/21/24 2042 07/22/24 0002 07/22/24 0413 07/22/24 0731  GLUCAP 85 171* 161* 99 109*   Iron  Studies: No results for input(s): IRON , TIBC, TRANSFERRIN, FERRITIN in the last 72 hours. @lablastinr3 @ Studies/Results: No results  found.  Medications:  anticoagulant sodium citrate       carvedilol   25 mg Per Tube BID WC   Chlorhexidine  Gluconate Cloth  6 each Topical Q0600   cinacalcet   30 mg Oral Q M,W,F   cloNIDine   0.1 mg Per Tube Daily   cycloSPORINE   200 mg Per Tube QPM   cycloSPORINE   225 mg Per Tube Daily   darbepoetin (ARANESP ) injection - DIALYSIS  200 mcg Subcutaneous Q Thu-1800   famotidine   10 mg Per Tube QHS   feeding supplement (NEPRO CARB STEADY)  1,000 mL Per Tube Q24H   fiber supplement (BANATROL TF)  60 mL Per Tube TID   gabapentin   100 mg Per Tube Daily   Gerhardt's butt cream   Topical BID   lacosamide   100 mg Per Tube BID   liver oil-zinc  oxide   Topical TID   losartan   25 mg Per Tube Q1200   multivitamin with minerals  1 tablet Per Tube Daily   mycophenolate   500 mg Per Tube BID   mouth rinse  15 mL Mouth Rinse 4 times per day   oxidized cellulose  1 each Topical Once   pantoprazole  (PROTONIX ) IV  40 mg Intravenous Daily   predniSONE   15 mg Per Tube Q breakfast    Dialysis Orders: 3:45hr, 400/A1.5, EDW 55.2kg, 2K/2Ca  bath, AVF, no heparin  - Prev on Mircera 150mcg IV q 2 weeks - No VDRA  Assessment/Plan: Acute R intracranial hemorrhage: 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 Needs ongoing rehab. ESRD: Continue HD on MWF schedule HTN/volume: BP much better - actually had some hypotension with HD. UF as tolerated. Off amlodipine , now on Coreg  25mg  bid, Losartan  25 mg, clonidine  down to 0.1mg  daily- taper off as able. Anemia of ESRD: Hgb 7s, transfuse prn. Continue Aranesp  200 q Thurs Secondary HPTH: CorrCa high, Phos low. Continue sensipar , not on VDRA. Nutrition: Alb low, continue Nepro TF. Consider repeat swallowing study in future. 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 Buttocks wound/yeast: Using antifungal powder, improved. Dispo: Insurance declined LTAC, and SNF, appeal pending. Tolerated HD in recliner on multiple days since 11/7.   Maisie Ronnald Acosta PA-C [facility] 07/23/2024,9:30 AM

## 2024-07-23 NOTE — Progress Notes (Signed)
 Physical Therapy Treatment Patient Details Name: [name] [name] MRN: [id number] DOB: September 23, 1980 Today's Date: 07/23/2024   History of Present Illness 43 yo female presents 05/19/24 for left gaze and L side weakness. 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 Intubated 9/15-9/23. EVD 9/15- 10/15; s/p shunt 10/15. S/p laparoscopy with intra-abdominal assistance ventricular-peritoneal shunt placement 10/15. 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 Pt reports fatigue from HD earlier and demonstrates limited activity tolerance. Pt able to perform bed mobility and transfers with up to minA. Pt able to take a few lateral steps at EOB, but unable to progress gait this session due to fatigue. Pt continues to benefit from PT services to progress toward functional mobility goals.    If plan is discharge home, recommend the following: A lot of help with walking and/or transfers;A lot of help with bathing/dressing/bathroom;Assistance with cooking/housework;Direct supervision/assist for medications management;Direct supervision/assist for financial management;Assist for transportation;Help with stairs or ramp for entrance   Can travel by private vehicle     No  Equipment Recommendations  Hospital bed;Hoyer lift;Wheelchair (measurements PT);Wheelchair cushion (measurements PT);BSC/3in1    Recommendations for Other Services       Precautions / Restrictions Precautions Precautions: Fall Recall of Precautions/Restrictions: Impaired Precaution/Restrictions Comments: PEG Restrictions Weight Bearing Restrictions Per Provider Order: No     Mobility  Bed Mobility Overal bed mobility:  Needs Assistance Bed Mobility: Supine to Sit, Sit to Supine     Supine to sit: Min assist, HOB elevated Sit to supine: Contact guard assist   General bed mobility comments: HOB slightly elevated with min A for trunk elevation    Transfers Overall transfer level: Needs assistance Equipment used: Rolling walker (2 wheels) Transfers: Sit to/from Stand Sit to Stand: Min assist           General transfer comment: From EOB x2 with min A for power up and cues for hand placement. Pt able to take a few lateral steps towards [facility]    Ambulation/Gait               General Gait Details: unable to progress due to fatigue   Stairs             Wheelchair Mobility     Tilt Bed    Modified Rankin (Stroke Patients Only) Modified Rankin (Stroke Patients Only) Pre-Morbid Rankin Score: Moderately severe disability Modified Rankin: Severe disability     Balance Overall balance assessment: Needs assistance Sitting-balance support: Bilateral upper extremity supported, Feet supported Sitting balance-Leahy Scale: Fair Sitting balance - Comments: CGA sitting EOB   Standing balance support: Bilateral upper extremity supported, During functional activity, Reliant on assistive device for balance Standing balance-Leahy Scale: Poor Standing balance comment: reliant on RW and external support                            Communication Communication Communication: Impaired Factors Affecting Communication: Reduced clarity of speech  Cognition Arousal: Alert Behavior During Therapy: WFL for tasks assessed/performed   PT - Cognitive impairments: Awareness, Safety/Judgement, Problem solving  Following commands: Impaired Following commands impaired: Follows one step commands with increased time, Follows multi-step commands inconsistently    Cueing Cueing Techniques: Verbal cues, Gestural cues, Tactile cues, Visual cues  Exercises       General Comments        Pertinent Vitals/Pain Pain Assessment Pain Assessment: Faces Faces Pain Scale: No hurt     PT Goals (current goals can now be found in the care plan section) Acute Rehab PT Goals Patient Stated Goal: unable to state goal PT Goal Formulation: Patient unable to participate in goal setting Time For Goal Achievement: 07/24/24 Progress towards PT goals: Progressing toward goals    Frequency    Min 2X/week       AM-PAC PT 6 Clicks Mobility   Outcome Measure  Help needed turning from your back to your side while in a flat bed without using bedrails?: A Little Help needed moving from lying on your back to sitting on the side of a flat bed without using bedrails?: A Little Help needed moving to and from a bed to a chair (including a wheelchair)?: A Little Help needed standing up from a chair using your arms (e.g., wheelchair or bedside chair)?: A Little Help needed to walk in hospital room?: Total Help needed climbing 3-5 steps with a railing? : Total 6 Click Score: 14    End of Session Equipment Utilized During Treatment: Gait belt Activity Tolerance: Patient limited by fatigue Patient left: with call bell/phone within reach;with family/visitor present;in bed;with bed alarm set Nurse Communication: Mobility status PT Visit Diagnosis: Unsteadiness on feet (R26.81);Other abnormalities of gait and mobility (R26.89);Muscle weakness (generalized) (M62.81);Difficulty in walking, not elsewhere classified (R26.2);Other symptoms and signs involving the nervous system (R29.898)     Time: 8540-8482 PT Time Calculation (min) (ACUTE ONLY): 18 min  Charges:    $Therapeutic Activity: 8-22 mins PT General Charges $$ ACUTE PT VISIT: 1 Visit                    Darryle George, PTA Acute Rehabilitation Services Secure Chat Preferred  [phone]    Darryle George 07/23/2024, 3:46 PM

## 2024-07-23 NOTE — Progress Notes (Signed)
 PROGRESS NOTE  [name] [name] FMW:[id number] DOB: 07-01-1981 DOA: 05/19/2024 PCP: [name]   LOS: 65 days   Brief narrative:  Patient is a  43-yrs-old female with past medical history of hypertension, CVA, type 1 diabetes, gastroparesis, kidney and pancreatic transplant 2014 with subsequent failure of renal transplant now on end-stage renal dialysis, anxiety, narcotic abuse presented to the hospital from dialysis unit on 05/19/2024 as code stroke.  Patient was noted to have hemorrhagic stroke.  Neurology neurosurgery in critical care were consulted.  EVD was placed on the left side and was initially admitted to neuro ICU.  She was also intubated and had prolonged intubation.  Subsequently, patient was extubated on 05/27/2024.  Important events:  9/15 admitted with ICH plus IVH, EVD was placed for hydrocephalus, intubated 9/22 palliative care meeting.  Family is insistent that they want full scope of care 9/23 Extubated 10/1 -developed fevers pancultures drawn and broad-spectrum antibiotics started 10/2 became afebrile after starting antibiotics, did not tolerate raising EVD 10/6 EVD was raised from 10 to 30 cm water , tolerating well, remained afebrile 10/7 EVD is at 30 cm water , CT scan will be done tomorrow morning, no overnight issues 10/8 she became somnolent, CT head showed increasing hydrocephalus, EVD was titrated down to 10 cm of water  with improvement in mental status 10/15 VP shunt placement 10/17 transferred to progressive unit with [name]  11/18-medically stable.  Awaiting for disposition.  Difficult disposition.  Assessment/Plan: Principal Problem:   Hemorrhagic stroke [facility]) Active Problems:   Moderate protein-calorie malnutrition   Pressure injury of skin   Nontraumatic subcortical hemorrhage of right cerebral hemisphere [facility])  Acute right parietotemporal intraparenchymal hemorrhage / Intraventricular hemorrhage: Uncontrolled hypertension: Obstructive  hydrocephalus s/p EVD changed to VP shunt: Seizure disorder S/p VP shunt placement on 06/18/2024 by Dr. Mavis.  Continue Vimpat .  Neurosurgery intermittently following.  Currently stable   ESRD on dialysis: History of failed renal transplant: Secondary hyperparathyroidism: Nephrology on board.  Continue CellCept  and prednisone .  On Monday, Wednesday, Friday hemodialysis schedule.  TOC working on outpatient hemodialysis set up  Acute bronchitis: Resolved Fever. Thought to be secondary to intracranial hemorrhage. Previous blood cultures showed contamination.  Repeat blood cultures negative in 5 days.  Afebrile.  Temperature max of 98.5 F.  CSF culture was negative in 3 days.   History of pancreatic/kidney transplantation:  Continue cyclosporine , mycophenolate , and prednisone .   Hypertension:  Currently on Coreg , amlodipine  and clonidine  on nondialysis days   Anemia of chronic disease:  Latest hemoglobin o 8.5.SABRATransfuse if Hb below 7.0   Type 1 diabetes with vasculopathy/gastroparesis:  Not on long-term insulin  anymore due to pancreatic transplant. On sliding scale at this time.   Moderate protein calorie malnutrition/dysphagia:  On PEG tube feeding.   Stage I sacral pressure ulcer present on admission: Continue wound care   Debility/deconditioning/disposition/goals of care:  Palliative care had seen the patient during hospitalization.  Family has insisted full code.   Palliative care has signed off 10/17.  Disposition pending.  TOC on board.  Cough with some chest pain.  Improved.  Chest x-ray was negative.  Likely has some GERD issues.  Protonix  IV has been initiated with improvement in her cough symptoms.   DVT prophylaxis: SCDs Start: 05/19/24 1724   Disposition: Long-term care/skilled nursing facility.  Disposition pending.  TOC on board  Status is: Inpatient  Remains inpatient appropriate because: Uncertain disposition, need for hemodialysis set up, TOC on board     Code Status:  Code Status: Full Code  Family Communication:  Spoke with the patient's mother at bedside on 07/23/2024.  Consultants: Nephrology Neurosurgery  Procedures: Ventriculoperitoneal shunt placement Hemodialysis  Anti-infectives:  None currently   Subjective: Today, patient was seen and examined at bedside.  Patient seen during hemodialysis.  Denies any nausea vomiting, abdominal pain shortness of breath or dyspnea.  States that her cough has gotten better.    Objective: Vitals:   07/23/24 0934 07/23/24 1006  BP: (!) 111/57 (!) 94/50  Pulse: 81 81  Resp: (!) 9 14  Temp:    SpO2: 99% 100%    Intake/Output Summary (Last 24 hours) at 07/23/2024 1012 Last data filed at 07/22/2024 2000 Gross per 24 hour  Intake 30 ml  Output --  Net 30 ml    Filed Weights   07/22/24 0500 07/23/24 0500 07/23/24 0749  Weight: 58.2 kg 59.4 kg 55.6 kg   Body mass index is 21.71 kg/m.   Physical Exam:  General:  Average built, not in obvious distress, appears chronically ill and deconditioned. HENT:   No scleral pallor or icterus noted. Oral mucosa is moist.  Chest:   Diminished breath sounds bilaterally.  CVS: S1 &S2 heard. No murmur.  Regular rate and rhythm. Abdomen: Soft, nontender, nondistended.  Bowel sounds are heard.  PEG tube in place. Extremities: No cyanosis, clubbing or edema.  Peripheral pulses are palpable. Psych: Alert, awake and oriented, normal mood CNS:  No cranial nerve deficits.    Right upper extremity AV fistula in place.  Weakness noted in the lower extremities Skin: Warm and dry.  No rashes noted.  Data Review: I have personally reviewed the following laboratory data and studies,  CBC: Recent Labs  Lab 07/18/24 0648 07/21/24 0907 07/23/24 0455  WBC 3.7* 5.4 3.5*  HGB 7.5* 7.8* 8.5*  HCT 22.5* 23.9* 25.9*  MCV 95.3 96.4 96.6  PLT 167 194 167   Basic Metabolic Panel: Recent Labs  Lab 07/18/24 0648 07/21/24 0906 07/23/24 0455  NA  130* 126* 127*  K 4.2 4.7 5.1  CL 88* 84* 86*  CO2 26 26 25   GLUCOSE 108* 122* 91  BUN 54* 81* 58*  CREATININE 3.74* 4.87* 4.07*  CALCIUM  10.2 10.5* 10.7*  MG  --   --  3.2*  PHOS 3.3 3.1  --    Liver Function Tests: Recent Labs  Lab 07/18/24 0648 07/21/24 0906  ALBUMIN  2.7* 2.8*   No results for input(s): LIPASE, AMYLASE in the last 168 hours. No results for input(s): AMMONIA in the last 168 hours. Cardiac Enzymes: No results for input(s): CKTOTAL, CKMB, CKMBINDEX, TROPONINI in the last 168 hours. BNP (last 3 results) Recent Labs    02/14/24 0949  BNP 1,486.6*    ProBNP (last 3 results) No results for input(s): PROBNP in the last 8760 hours.  CBG: Recent Labs  Lab 07/21/24 1625 07/21/24 2042 07/22/24 0002 07/22/24 0413 07/22/24 0731  GLUCAP 85 171* 161* 99 109*   No results found for this or any previous visit (from the past 240 hours).   Studies: No results found.     Vernal Alstrom, MD  [name] 07/23/2024  If 7PM-7AM, please contact night-coverage

## 2024-07-23 NOTE — Plan of Care (Signed)
   Problem: Education: Goal: Ability to describe self-care measures that may prevent or decrease complications (Diabetes Survival Skills Education) will improve Outcome: Progressing Goal: Individualized Educational Video(s) Outcome: Progressing   Problem: Coping: Goal: Ability to adjust to condition or change in health will improve Outcome: Progressing

## 2024-07-24 DIAGNOSIS — I619 Nontraumatic intracerebral hemorrhage, unspecified: Secondary | ICD-10-CM | POA: Diagnosis not present

## 2024-07-24 NOTE — Progress Notes (Signed)
 [facility] Progress Note   Subjective:   Seen in room. No new events. No complaints.  Wants to eat.   Objective Vitals:   07/23/24 1930 07/23/24 2357 07/24/24 0405 07/24/24 0720  BP: (!) 146/74 (!) 145/67 (!) 155/68 (!) 166/74  Pulse: 79 78 76 77  Resp:  18 16   Temp: 97.7 F (36.5 C) 98 F (36.7 C) 98.3 F (36.8 C) 98.3 F (36.8 C)  TempSrc: Oral Oral Oral Oral  SpO2: 99% 98% 100% 100%  Weight:      Height:       Physical Exam General: alert female in NAD Heart: RRR, no rub  Lungs: CTA bilaterally, respirations unlabored Abdomen: Soft, non-tender  Extremities: no edema b/l lower extremities Dialysis Access: AVF + t/b  Additional Objective Labs: Basic Metabolic Panel: Recent Labs  Lab 07/18/24 0648 07/21/24 0906 07/23/24 0455  NA 130* 126* 127*  K 4.2 4.7 5.1  CL 88* 84* 86*  CO2 26 26 25   GLUCOSE 108* 122* 91  BUN 54* 81* 58*  CREATININE 3.74* 4.87* 4.07*  CALCIUM  10.2 10.5* 10.7*  PHOS 3.3 3.1  --    Liver Function Tests: Recent Labs  Lab 07/18/24 0648 07/21/24 0906  ALBUMIN  2.7* 2.8*   No results for input(s): LIPASE, AMYLASE in the last 168 hours. CBC: Recent Labs  Lab 07/18/24 0648 07/21/24 0907 07/23/24 0455  WBC 3.7* 5.4 3.5*  HGB 7.5* 7.8* 8.5*  HCT 22.5* 23.9* 25.9*  MCV 95.3 96.4 96.6  PLT 167 194 167   Blood Culture    Component Value Date/Time   SDES CSF 06/11/2024 0920   SPECREQUEST LP 06/11/2024 0920   CULT  06/11/2024 0920    NO GROWTH 3 DAYS Performed at [facility], [address], [place], [name] [address]    REPTSTATUS 06/14/2024 FINAL 06/11/2024 0920    Cardiac Enzymes: No results for input(s): CKTOTAL, CKMB, CKMBINDEX, TROPONINI in the last 168 hours. CBG: Recent Labs  Lab 07/21/24 1625 07/21/24 2042 07/22/24 0002 07/22/24 0413 07/22/24 0731  GLUCAP 85 171* 161* 99 109*   Iron  Studies: No results for input(s): IRON , TIBC, TRANSFERRIN, FERRITIN in the last 72  hours. @lablastinr3 @ Studies/Results: No results found.  Medications:    carvedilol   25 mg Per Tube BID WC   Chlorhexidine  Gluconate Cloth  6 each Topical Q0600   cinacalcet   30 mg Oral Q M,W,F   cycloSPORINE   200 mg Per Tube QPM   cycloSPORINE   225 mg Per Tube Daily   darbepoetin (ARANESP ) injection - DIALYSIS  200 mcg Subcutaneous Q Thu-1800   famotidine   10 mg Per Tube QHS   feeding supplement (NEPRO CARB STEADY)  1,000 mL Per Tube Q24H   fiber supplement (BANATROL TF)  60 mL Per Tube TID   gabapentin   100 mg Per Tube Daily   Gerhardt's butt cream   Topical BID   lacosamide   100 mg Per Tube BID   liver oil-zinc  oxide   Topical TID   losartan   25 mg Per Tube Q1200   multivitamin with minerals  1 tablet Per Tube Daily   mycophenolate   500 mg Per Tube BID   mouth rinse  15 mL Mouth Rinse 4 times per day   oxidized cellulose  1 each Topical Once   pantoprazole  (PROTONIX ) IV  40 mg Intravenous Daily   predniSONE   15 mg Per Tube Q breakfast    Dialysis Orders: 3:45hr, 400/A1.5, EDW 55.2kg, 2K/2Ca bath, AVF, no  heparin  - Prev on Mircera 150mcg IV q 2 weeks - No VDRA  Assessment/Plan: Acute R intracranial hemorrhage: 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 Needs ongoing rehab. ESRD: Continue HD on MWF schedule HTN/volume: BP much better - actually had some hypotension with HD. UF as tolerated. Off amlodipine , now on Coreg  25mg  bid, Losartan  25 mg, clonidine  down to 0.1mg  daily- taper off as able. Anemia of ESRD: Hgb 7s, transfuse prn. Continue Aranesp  200 q Thurs Secondary HPTH: CorrCa high, Phos low. Continue sensipar , not on VDRA. Nutrition: Alb low, continue Nepro TF. Consider repeat swallowing study in future. 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 Buttocks wound/yeast: Using antifungal powder, improved. Dispo: Insurance declined LTAC, and SNF, appeal pending. Tolerated HD in recliner on multiple days since 11/7.   [name] Ronnald Acosta  PA-C [facility] 07/24/2024,8:48 AM

## 2024-07-24 NOTE — Evaluation (Addendum)
 Clinical/Bedside Swallow Evaluation Patient Details  Name: [name] [name] MRN: [id number] Date of Birth: 1981/03/05  Today's Date: 07/24/2024 Time: SLP Start Time (ACUTE ONLY): 1441 SLP Stop Time (ACUTE ONLY): 1505 SLP Time Calculation (min) (ACUTE ONLY): 24 min  Past Medical History:  Past Medical History:  Diagnosis Date   Anemia    Anxiety    DM (diabetes mellitus), type 1 (HCC)    ESRD on hemodialysis (HCC)    Gastroparesis    History of simultaneous kidney and pancreas transplant (HCC) 2014   Hypertension    Ischemic cerebrovascular accident (CVA) (HCC) 11/2021   Narcotic abuse (HCC)    Non compliance with medical treatment    Stroke [facility])    Vision loss    Past Surgical History:  Past Surgical History:  Procedure Laterality Date   A/V FISTULAGRAM Right 02/17/2021   Procedure: A/V FISTULAGRAM;  Surgeon: Gretta Lonni PARAS, MD;  Location: [facility] INVASIVE CV LAB;  Service: Cardiovascular;  Laterality: Right;   AV FISTULA PLACEMENT  11/17/2011   Procedure: ARTERIOVENOUS (AV) FISTULA CREATION;  Surgeon: Krystal JULIANNA Doing, MD;  Location[facility] OR;  Service: Vascular;  Laterality: Right;   BIOPSY  12/03/2021   Procedure: BIOPSY;  Surgeon: Legrand Victory LITTIE DOUGLAS, MD;  Location: [facility] ENDOSCOPY;  Service: Gastroenterology;;   ESOPHAGOGASTRODUODENOSCOPY (EGD) WITH PROPOFOL  N/A 12/03/2021   Procedure: ESOPHAGOGASTRODUODENOSCOPY (EGD) WITH PROPOFOL ;  Surgeon: Legrand Victory LITTIE DOUGLAS, MD;  Location: [facility] ENDOSCOPY;  Service: Gastroenterology;  Laterality: N/A;   EYE SURGERY     HEMATOMA EVACUATION Right 02/25/2021   Procedure: EVACUATION HEMATOMA RIGHT CHEST;  Surgeon: Sheree Penne Lonni, MD;  Location: [facility] OR;  Service: Vascular;  Laterality: Right;   INSERTION OF DIALYSIS CATHETER Right 12/08/2020   Procedure: INSERTION OF TUNNELED DIALYSIS CATHETER;  Surgeon: Gretta Lonni PARAS, MD;  Location: [facility] OR;  Service: Vascular;  Laterality: Right;   IR ANGIO INTRA EXTRACRAN SEL INTERNAL CAROTID BILAT MOD SED   02/27/2024   IR ANGIO VERTEBRAL SEL VERTEBRAL UNI R MOD SED  02/27/2024   IR GASTROSTOMY TUBE MOD SED  03/20/2024   IR GJ TUBE CHANGE  04/15/2024   IR GJ TUBE CHANGE  05/21/2024   IR REMOVAL TUN CV CATH W/O FL  05/03/2021   KIDNEY TRANSPLANT     LAPAROSCOPIC REVISION VENTRICULAR-PERITONEAL (V-P) SHUNT N/A 06/18/2024   Procedure: REVISION, PROCEDURE INVOLVING VENTRICULOPERITONEAL SHUNT, LAPAROSCOPIC;  Surgeon: Vernetta Berg, MD;  Location: [facility] OR;  Service: General;  Laterality: N/A;   NEPHRECTOMY TRANSPLANTED ORGAN     pancrease transplant     PERIPHERAL VASCULAR BALLOON ANGIOPLASTY Right 02/17/2021   Procedure: PERIPHERAL VASCULAR BALLOON ANGIOPLASTY;  Surgeon: Gretta Lonni PARAS, MD;  [facility]: [facility] INVASIVE CV LAB;  Service: Cardiovascular;  Laterality: Right;   REFRACTIVE SURGERY     REVISON OF ARTERIOVENOUS FISTULA Right 12/08/2020   Procedure: RIGHT ARM ARTERIOVENOUS FISTULA REVISION AND RESECTION;  Surgeon: Gretta Lonni PARAS, MD;  Location: [facility] OR;  Service: Vascular;  Laterality: Right;   REVISON OF ARTERIOVENOUS FISTULA Right 02/25/2021   Procedure: RIGHT ARM ARTERIOVENOUS FISTULA REVISION WITH TRANSPOSITION OF CEPHALIC VEIN ON AXILLARY VEIN;  Surgeon: Gretta Lonni PARAS, MD;  Location: [facility] OR;  Service: Vascular;  Laterality: Right;   VENTRICULOPERITONEAL SHUNT N/A 06/18/2024   Procedure: SHUNT INSERTION VENTRICULAR-PERITONEAL;  Surgeon: Mavis Purchase, MD;  Location: [facility] OR;  Service: Neurosurgery;  Laterality: N/A;  VP SHUNT   HPI:  Patient is a 43 y.o. female who is well known to SLP department from prior hospitalizations.  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 PEG placed in July 2025 and converted to GJ tube in August. She has been hospitalized since 05/19/2024 for left gaze and left sided weakness. CTH with large IVH on R>L occipital horn,  medial R temporal lobe inseparable from IVH, developing hydrocephalus, and residual hemorrhage in L cerebellar hemisphere. Intubated 9/15-9/23. EVD 9/15- 10/15; s/p shunt 10/15. SLP has followed patient for dysphagia in the past, with recommendation for thin liquids/full liquids diet back in July of 2025. At that time, advancement of PO's was hindered by patient's lethargy and frequent bouts of emesis. SLP ordered for cognitive evaluation during current admission which was completed on 06/21/24, however patient did not progress due to somnolence. SLP s/o and did indicate recommendation to reorder SLP for swallow evaluation if/when she is able to maintain adequate alertness. SLP swallow evaluation order placed by MD on 07/24/24.    Assessment / Plan / Recommendation  Clinical Impression  Patient is not currently presenting with clinical s/s of dysphagia as per this BSE. Patient was alert and awake and participated in this evaluation. After oral care, SLP assessed patient's swallow via PO's of: ice chips and thin liquids. Swallow initiation was timely and no overt s/s of aspiration observed with ice chips or straw sips of thin liquids. Patient had one sip of water  and two sips of apple juice but refused to drink more sips. Due to patient's prolonged hospitilization and prolonged NPO status with emesis, SLP recommending MBS to objectively evaluate her swallow. Recommend patient continue NPO and MBS planned for next date if patient is alert enough and schedule permitting.  SLP Visit Diagnosis: Dysphagia, unspecified (R13.10)    Aspiration Risk       Diet Recommendation NPO    Medication Administration: Via alternative means    Other  Recommendations       Assistance Recommended at Discharge    Functional Status Assessment Patient has had a recent decline in their functional status and demonstrates the ability to make significant improvements in function in a reasonable and predictable amount of time.   Frequency and Duration min 2x/week  1 week       Prognosis Prognosis for improved oropharyngeal function: Fair      Swallow Study   General Date of Onset: 07/24/24 HPI: Patient is a 43 y.o. female who is well known to SLP department from prior hospitalizations. 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 PEG placed in July 2025 and converted to GJ tube in August. She has been hospitalized since 05/19/2024 for left gaze and left sided weakness. 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 Intubated 9/15-9/23. EVD 9/15- 10/15; s/p shunt 10/15. SLP has followed patient for dysphagia in the past, with recommendation for thin liquids/full liquids diet back in July of 2025. At that time, advancement of PO's was hindered by patient's lethargy and frequent bouts of emesis. SLP ordered for cognitive evaluation during current admission which was completed on 06/21/24, however patient did not progress due to somnolence. SLP s/o and did indicate recommendation to reorder SLP for swallow evaluation if/when she is able to maintain adequate alertness. SLP swallow evaluation order placed by MD on 07/24/24. Type of Study: Bedside  Swallow Evaluation Diet Prior to this Study: NPO Temperature Spikes Noted: No Respiratory Status: Room air History of Recent Intubation: Yes Total duration of intubation (days): 8 days Date extubated: 05/27/24 Behavior/Cognition: Alert;Cooperative;Pleasant mood Oral Cavity Assessment: Within Functional Limits Oral Care Completed by SLP: Yes Oral Cavity - Dentition: Missing dentition Vision: Functional for self-feeding Self-Feeding Abilities: Able to feed self Patient Positioning: Upright in bed    Oral/Motor/Sensory Function  Overall Oral Motor/Sensory Function: Within functional limits   Ice Chips Ice chips: Within functional limits Presentation: Spoon   Thin Liquid Thin Liquid: Within functional limits Presentation: Straw    Nectar Thick     Honey Thick     Puree     Solid           Damien Hy  Graduate SLP Clinican

## 2024-07-24 NOTE — Progress Notes (Signed)
 PROGRESS NOTE  [name] [name] FMW:[id number] DOB: 1981-03-24 DOA: 05/19/2024 PCP: [name]   LOS: 66 days   Brief narrative:  Patient is a  43-yrs-old female with past medical history of hypertension, CVA, type 1 diabetes, gastroparesis, kidney and pancreatic transplant 2014 with subsequent failure of renal transplant now on end-stage renal dialysis, anxiety, narcotic abuse presented to the hospital from dialysis unit on 05/19/2024 as code stroke.  Patient was noted to have hemorrhagic stroke.  Neurology, neurosurgery in critical care were consulted.  EVD was placed on the left side and was initially admitted to neuro ICU.  She was also intubated and had prolonged intubation.  Subsequently, patient was extubated on 05/27/2024.  Important events:  9/15 admitted with ICH plus IVH, EVD was placed for hydrocephalus, intubated 9/22 palliative care meeting.  Family is insistent that they want full scope of care 9/23 Extubated 10/1 -developed fevers pancultures drawn and broad-spectrum antibiotics started 10/2 became afebrile after starting antibiotics, did not tolerate raising EVD 10/6 EVD was raised from 10 to 30 cm water , tolerating well, remained afebrile 10/7 EVD is at 30 cm water , CT scan will be done tomorrow morning, no overnight issues 10/8 she became somnolent, CT head showed increasing hydrocephalus, EVD was titrated down to 10 cm of water  with improvement in mental status 10/15 VP shunt placement 10/17 transferred to progressive unit with [name]  11/18-medically stable.  Awaiting for disposition.  Difficult disposition.  Assessment/Plan: Principal Problem:   Hemorrhagic stroke [facility]) Active Problems:   Moderate protein-calorie malnutrition   Pressure injury of skin   Nontraumatic subcortical hemorrhage of right cerebral hemisphere [facility])  Acute right parietotemporal intraparenchymal hemorrhage / Intraventricular hemorrhage: Uncontrolled hypertension: Obstructive  hydrocephalus s/p EVD changed to VP shunt: Seizure disorder S/p VP shunt placement on 06/18/2024 by Dr. Mavis.  Continue Vimpat .  Neurosurgery intermittently followed the patient during hospitalization.  Currently stable   ESRD on dialysis: History of failed renal transplant: Secondary hyperparathyroidism: Nephrology on board.  Continue CellCept  and prednisone .  On Monday, Wednesday, Friday hemodialysis schedule.  TOC working on outpatient hemodialysis set up  Acute bronchitis with fever: Resolved Thought to be secondary to intracranial hemorrhage. Previous blood cultures showed contamination.  Repeat blood cultures negative in 5 days.  Afebrile.  Temperature max of 98.3 F.  CSF culture  negative in 3 days.   History of pancreatic/kidney transplantation:  Continue cyclosporine , mycophenolate , and prednisone .   Hypertension:  Currently on Coreg , amlodipine  and clonidine  on nondialysis days   Anemia of chronic disease:  Latest hemoglobin  8.5.SABRATransfuse if Hb below 7.0   Type 1 diabetes with vasculopathy/gastroparesis:  Not on long-term insulin  anymore due to pancreatic transplant. On sliding scale at this time.   Moderate protein calorie malnutrition/dysphagia:  On PEG tube feeding..  Mother requesting a swallow evaluation at this time.   Stage I sacral pressure ulcer present on admission: Continue wound care   Debility/deconditioning/disposition/goals of care:  Palliative care had seen the patient during hospitalization.  Family has insisted full code.   Palliative care has signed off 10/17.  Disposition pending.  TOC on board.  Cough with some chest pain.  Improved.  Chest x-ray was negative.  Likely has some GERD issues.  Protonix  IV has been initiated with improvement in her cough symptoms.   DVT prophylaxis: SCDs Start: 05/19/24 1724   Disposition: Long-term care/skilled nursing facility.  Disposition pending.  TOC on board  Status is: Inpatient  Remains inpatient  appropriate because: Uncertain disposition, need for hemodialysis  set up, TOC on board    Code Status:     Code Status: Full Code  Family Communication:  Spoke with the patient's mother at bedside on 07/24/2024.  Consultants: Nephrology Neurosurgery  Procedures: Ventriculoperitoneal shunt placement Hemodialysis  Anti-infectives:  None currently   Subjective: Today, patient was seen and examined at bedside.  Patient's mother at bedside.  Denies interval complaints.  No  vomiting fever or chills.  Has mild nausea.  Patient mother wishes swallow evaluation.  Objective: Vitals:   07/24/24 0405 07/24/24 0720  BP: (!) 155/68 (!) 166/74  Pulse: 76 77  Resp: 16   Temp: 98.3 F (36.8 C) 98.3 F (36.8 C)  SpO2: 100% 100%    Intake/Output Summary (Last 24 hours) at 07/24/2024 1044 Last data filed at 07/23/2024 1144 Gross per 24 hour  Intake --  Output 2100 ml  Net -2100 ml    Filed Weights   07/23/24 0500 07/23/24 0749 07/23/24 1144  Weight: 59.4 kg 55.6 kg 55.2 kg   Body mass index is 21.56 kg/m.   Physical Exam:  General:  Average built, not in obvious distress, appears chronically ill and deconditioned. HENT:   No scleral pallor or icterus noted. Oral mucosa is moist.  Chest:   Diminished breath sounds bilaterally.  CVS: S1 &S2 heard. No murmur.  Regular rate and rhythm. Abdomen: Soft, nontender, nondistended.  Bowel sounds are heard.  PEG tube in place. Extremities: No cyanosis, clubbing or edema.  Peripheral pulses are palpable. Psych: Alert, awake and oriented, normal mood CNS:  No cranial nerve deficits.    Right upper extremity AV fistula in place.  Weakness noted in the lower extremities Skin: Warm and dry.  No rashes noted.  Data Review: I have personally reviewed the following laboratory data and studies,  CBC: Recent Labs  Lab 07/18/24 0648 07/21/24 0907 07/23/24 0455  WBC 3.7* 5.4 3.5*  HGB 7.5* 7.8* 8.5*  HCT 22.5* 23.9* 25.9*  MCV 95.3  96.4 96.6  PLT 167 194 167   Basic Metabolic Panel: Recent Labs  Lab 07/18/24 0648 07/21/24 0906 07/23/24 0455  NA 130* 126* 127*  K 4.2 4.7 5.1  CL 88* 84* 86*  CO2 26 26 25   GLUCOSE 108* 122* 91  BUN 54* 81* 58*  CREATININE 3.74* 4.87* 4.07*  CALCIUM  10.2 10.5* 10.7*  MG  --   --  3.2*  PHOS 3.3 3.1  --    Liver Function Tests: Recent Labs  Lab 07/18/24 0648 07/21/24 0906  ALBUMIN  2.7* 2.8*   No results for input(s): LIPASE, AMYLASE in the last 168 hours. No results for input(s): AMMONIA in the last 168 hours. Cardiac Enzymes: No results for input(s): CKTOTAL, CKMB, CKMBINDEX, TROPONINI in the last 168 hours. BNP (last 3 results) Recent Labs    02/14/24 0949  BNP 1,486.6*    ProBNP (last 3 results) No results for input(s): PROBNP in the last 8760 hours.  CBG: Recent Labs  Lab 07/21/24 1625 07/21/24 2042 07/22/24 0002 07/22/24 0413 07/22/24 0731  GLUCAP 85 171* 161* 99 109*   No results found for this or any previous visit (from the past 240 hours).   Studies: No results found.    Vernal Alstrom, MD  [name] 07/24/2024  If 7PM-7AM, please contact night-coverage

## 2024-07-24 NOTE — Progress Notes (Signed)
 Physical Therapy Treatment Patient Details Name: [name] [name] MRN: [id number] DOB: 03-27-1981 Today's Date: 07/24/2024   History of Present Illness 43 yo female presents 05/19/24 for left gaze and L side weakness. 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 Intubated 9/15-9/23. EVD 9/15- 10/15; s/p shunt 10/15. S/p laparoscopy with intra-abdominal assistance ventricular-peritoneal shunt placement 10/15. PMH: HTN, ESRD on HD s/p renal and pancreatic transplants, DM, gastroparesis, multiple strokes (ischemic and hemorrhagic) with prepontine SAH, hydrocephalus requiring EVD placement    PT Comments  Pt received in supine and agreeable to session focused on progressing gait training. Pt able to tolerate 2 short gait trials with chair follow due to quick fatigue. Pt demonstrates R drift and L inattention requiring cues and assist for RW management. Pt limited by impaired activity tolerance and weakness. Pt agreeable to sit in recliner at end of session with mom present. Pt continues to benefit from PT services to progress toward functional mobility goals.     If plan is discharge home, recommend the following: A lot of help with walking and/or transfers;A lot of help with bathing/dressing/bathroom;Assistance with cooking/housework;Direct supervision/assist for medications management;Direct supervision/assist for financial management;Assist for transportation;Help with stairs or ramp for entrance   Can travel by private vehicle     No  Equipment Recommendations  Hospital bed;Hoyer lift;Wheelchair (measurements PT);Wheelchair cushion (measurements PT);BSC/3in1    Recommendations for Other Services       Precautions / Restrictions Precautions Precautions: Fall Recall of Precautions/Restrictions: Impaired Precaution/Restrictions Comments: PEG Restrictions Weight Bearing  Restrictions Per Provider Order: No     Mobility  Bed Mobility Overal bed mobility: Needs Assistance Bed Mobility: Supine to Sit     Supine to sit: Min assist, HOB elevated     General bed mobility comments: HHA for trunk elevation and assist to scoot to EOB with bedpad    Transfers Overall transfer level: Needs assistance Equipment used: Rolling walker (2 wheels) Transfers: Sit to/from Stand Sit to Stand: Min assist           General transfer comment: STS from EOB and recliner with min A for power up and cues for hand placement    Ambulation/Gait Ambulation/Gait assistance: Min assist, +2 safety/equipment Gait Distance (Feet): 10 Feet (+5) Assistive device: Rolling walker (2 wheels) Gait Pattern/deviations: Step-through pattern, Decreased stride length, Drifts right/left, Trunk flexed       General Gait Details: Pt demonstrates slow, short steps with low foot clearance. R drift requiring cues for L attention and assist for RW management. Chair follow due to quick fatigue. Cues for upright posture due to increased trunk flexion with fatigue   Stairs             Wheelchair Mobility     Tilt Bed    Modified Rankin (Stroke Patients Only) Modified Rankin (Stroke Patients Only) Pre-Morbid Rankin Score: Moderately severe disability Modified Rankin: Moderately severe disability     Balance Overall balance assessment: Needs assistance Sitting-balance support: Bilateral upper extremity supported, Feet supported Sitting balance-Leahy Scale: Fair Sitting balance - Comments: CGA sitting EOB   Standing balance support: Bilateral upper extremity supported, During functional activity, Reliant on assistive device for balance Standing balance-Leahy Scale: Poor Standing balance comment: reliant on RW and external support                            Communication  Communication Communication: Impaired Factors Affecting Communication: Reduced clarity of  speech  Cognition Arousal: Alert Behavior During Therapy: WFL for tasks assessed/performed   PT - Cognitive impairments: Awareness, Safety/Judgement, Problem solving                         Following commands: Impaired Following commands impaired: Follows one step commands with increased time, Follows multi-step commands inconsistently    Cueing Cueing Techniques: Verbal cues, Gestural cues, Tactile cues, Visual cues  Exercises      General Comments        Pertinent Vitals/Pain Pain Assessment Pain Assessment: Faces Faces Pain Scale: No hurt     PT Goals (current goals can now be found in the care plan section) Acute Rehab PT Goals Patient Stated Goal: unable to state goal PT Goal Formulation: Patient unable to participate in goal setting Time For Goal Achievement: 07/24/24 Progress towards PT goals: Progressing toward goals    Frequency    Min 2X/week       AM-PAC PT 6 Clicks Mobility   Outcome Measure  Help needed turning from your back to your side while in a flat bed without using bedrails?: A Little Help needed moving from lying on your back to sitting on the side of a flat bed without using bedrails?: A Little Help needed moving to and from a bed to a chair (including a wheelchair)?: A Little Help needed standing up from a chair using your arms (e.g., wheelchair or bedside chair)?: A Little Help needed to walk in hospital room?: A Lot (>81ft) Help needed climbing 3-5 steps with a railing? : Total 6 Click Score: 15    End of Session Equipment Utilized During Treatment: Gait belt Activity Tolerance: Patient limited by fatigue;Patient tolerated treatment well Patient left: with call bell/phone within reach;with family/visitor present;in chair;with chair alarm set Nurse Communication: Mobility status PT Visit Diagnosis: Unsteadiness on feet (R26.81);Other abnormalities of gait and mobility (R26.89);Muscle weakness (generalized) (M62.81);Difficulty  in walking, not elsewhere classified (R26.2);Other symptoms and signs involving the nervous system (R29.898)     Time: 8860-8843 PT Time Calculation (min) (ACUTE ONLY): 17 min  Charges:    $Gait Training: 8-22 mins PT General Charges $$ ACUTE PT VISIT: 1 Visit                    Darryle George, PTA Acute Rehabilitation Services Secure Chat Preferred  [phone]    Darryle George 07/24/2024, 12:17 PM

## 2024-07-24 NOTE — Progress Notes (Signed)
 Occupational Therapy Treatment Patient Details Name: [name] [name] MRN: [id number] DOB: [birth year] Today's Date: 07/24/2024   History of present illness 43 yo female presents 05/19/24 for left gaze and L side weakness. 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 Intubated 9/15-9/23. EVD 9/15- 10/15; s/p shunt 10/15. S/p laparoscopy with intra-abdominal assistance ventricular-peritoneal shunt placement 10/15. PMH: HTN, ESRD on HD s/p renal and pancreatic transplants, DM, gastroparesis, multiple strokes (ischemic and hemorrhagic) with prepontine SAH, hydrocephalus requiring EVD placement   OT comments  Pt greeted in bed, supportive mother present at bedside. Pt demonstrating excellent progress towards OT goals today. AMPAC-ADL improved to 12/24 indicating improving but reduced functional performance. Focus of today's session on ADL re-training and implementing visual scanning into tasks. Pt more conversational today and following 1-step commands consistently, intermittent 2-step commands with incr time/effort. She continues to be limited by impaired problem solving, attention, and task initiation/motor planning. Requiring min A for transfers and mobility via RW and min A with extensive cog cueing to complete oral care standing sinkside. Required one seated rest break 2/2 fatigue, VSS. Returned to supine, agreeable and appreciative of session today. OT to continue to follow and progress as able.      If plan is discharge home, recommend the following:  Assistance with cooking/housework;Assist for transportation;Help with stairs or ramp for entrance;A lot of help with bathing/dressing/bathroom;A little help with walking and/or transfers;Direct supervision/assist for medications management;Direct supervision/assist for financial management;Supervision due to cognitive status    Equipment Recommendations  Other (comment) (defer to next level of care)    Recommendations for Other Services      Precautions / Restrictions Precautions Precautions: Fall Recall of Precautions/Restrictions: Impaired Precaution/Restrictions Comments: PEG Restrictions Weight Bearing Restrictions Per Provider Order: No       Mobility Bed Mobility Overal bed mobility: Needs Assistance Bed Mobility: Supine to Sit, Sit to Supine     Supine to sit: Min assist, HOB elevated, Used rails Sit to supine: Contact guard assist, HOB elevated, Used rails   General bed mobility comments: assist for trunk elevation to come to sitting EOB, exited to the L side    Transfers Overall transfer level: Needs assistance Equipment used: Rolling walker (2 wheels) Transfers: Sit to/from Stand Sit to Stand: Min assist           General transfer comment: Stood from bed with VC for appropriate hand placement and powering up.     Balance Overall balance assessment: Needs assistance Sitting-balance support: Single extremity supported, Feet supported Sitting balance-Leahy Scale: Fair Sitting balance - Comments: seated EOB, intermittent sup/CGA for safety/stability   Standing balance support: Bilateral upper extremity supported, During functional activity, Reliant on assistive device for balance Standing balance-Leahy Scale: Poor Standing balance comment: reliant on RW, with few LOB when changing directions, min A to correct                           ADL either performed or assessed with clinical judgement   ADL Overall ADL's : Needs assistance/impaired Eating/Feeding: NPO (PEG)   Grooming: Minimal assistance;Standing;Oral care;Wash/dry face;Brushing hair Grooming Details (indicate cue type and reason): assist to accurately identify grooming items (located on L side), attempting to use toothpaste bottle to brush teeth, improved sequencing & motor planning once prompted with  correction             Lower  Body Dressing: Contact guard assist;Sitting/lateral leans;Bed level Lower Body Dressing Details (indicate cue type and reason): figure-4 technique for donning/doffing socks in bed             Functional mobility during ADLs: Minimal assistance;Rolling walker (2 wheels);Cueing for safety;Cueing for sequencing      Extremity/Trunk Assessment              Vision   Additional Comments: improved eye contact with therapist, improved visual scanning to locate grooming items today   Perception Perception Perception: Impaired Preception Impairment Details: Inattention/Neglect Perception-Other Comments: L inattention improving, still benefits from ~min cues to locate items in L environment   Praxis Praxis Praxis: Impaired Praxis Impairment Details: Motor planning;Initiation Praxis-Other Comments: improving once prompted and assisted with identifying appropriate tools needed to complete a task   Communication Communication Communication: Impaired Factors Affecting Communication: Reduced clarity of speech (although vocal quality improving compared to previous sessions)   Cognition Arousal: Alert Behavior During Therapy: WFL for tasks assessed/performed Cognition: Cognition impaired   Orientation impairments: Time (pt reporting the days blur together, provided logical cueing towards time with Thanksgiving being next week with improving recall of biographical information) Awareness: Intellectual awareness impaired (improving) Memory impairment (select all impairments): Short-term memory Attention impairment (select first level of impairment): Sustained attention Executive functioning impairment (select all impairments): Initiation, Organization, Sequencing, Problem solving OT - Cognition Comments: much more conversational this date; following 1-step commands rather consistently, ~50% of 2-step commands with incr processing time                  Following commands: Impaired Following commands impaired: Follows one step commands with increased time, Follows multi-step commands inconsistently, Follows multi-step commands with increased time      Cueing   Cueing Techniques: Verbal cues, Gestural cues, Tactile cues, Visual cues  Exercises      Shoulder Instructions       General Comments supportive mother present and helpful during OT session today    Pertinent Vitals/ Pain       Pain Assessment Pain Assessment: Faces Faces Pain Scale: No hurt  Home Living                                          Prior Functioning/Environment              Frequency  Min 2X/week        Progress Toward Goals  OT Goals(current goals can now be found in the care plan section)  Progress towards OT goals: Progressing toward goals (excellent progress this date)     Plan      Co-evaluation                 AM-PAC OT 6 Clicks Daily Activity     Outcome Measure   Help from another person eating meals?: Total Help from another person taking care of personal grooming?: A Little Help from another person toileting, which includes using toliet, bedpan, or urinal?: A Lot Help from another person bathing (including washing, rinsing, drying)?: A Lot Help from another person to put on and taking off regular upper body clothing?: A Lot Help from another person to put on and taking off regular lower body clothing?: A Lot 6 Click Score: 12    End of Session Equipment Utilized During Treatment: Gait belt;Rolling walker (2 wheels)  OT Visit Diagnosis: Unsteadiness  on feet (R26.81);Muscle weakness (generalized) (M62.81);History of falling (Z91.81);Other symptoms and signs involving cognitive function;Cognitive communication deficit (R41.841);Low vision, both eyes (H54.2) Symptoms and signs involving cognitive functions: Nontraumatic SAH   Activity Tolerance Patient tolerated treatment well   Patient Left in  bed;with family/visitor present;with call bell/phone within reach;with bed alarm set   Nurse Communication Mobility status        Time: 8443-8379 OT Time Calculation (min): 24 min  Charges: OT General Charges $OT Visit: 1 Visit OT Treatments $Self Care/Home Management : 23-37 mins  Tedi Hughson M. [place], OTR/L [facility] Acute Rehabilitation Services [phone] Secure Chat Preferred  Rikki Burma 07/24/2024, 5:15 PM

## 2024-07-25 DIAGNOSIS — I619 Nontraumatic intracerebral hemorrhage, unspecified: Secondary | ICD-10-CM | POA: Diagnosis not present

## 2024-07-25 LAB — RENAL FUNCTION PANEL
Albumin: 3.1 g/dL — ABNORMAL LOW (ref 3.5–5.0)
Anion gap: 13 (ref 5–15)
BUN: 51 mg/dL — ABNORMAL HIGH (ref 6–20)
CO2: 27 mmol/L (ref 22–32)
Calcium: 10.5 mg/dL — ABNORMAL HIGH (ref 8.9–10.3)
Chloride: 87 mmol/L — ABNORMAL LOW (ref 98–111)
Creatinine, Ser: 3.54 mg/dL — ABNORMAL HIGH (ref 0.44–1.00)
GFR, Estimated: 16 mL/min — ABNORMAL LOW (ref >60)
Glucose, Bld: 110 mg/dL — ABNORMAL HIGH (ref 70–99)
Phosphorus: 3.8 mg/dL (ref 2.5–4.6)
Potassium: 4.5 mmol/L (ref 3.5–5.1)
Sodium: 127 mmol/L — ABNORMAL LOW (ref 135–145)

## 2024-07-25 LAB — CBC
HCT: 25.9 % — ABNORMAL LOW (ref 36.0–46.0)
Hemoglobin: 8.5 g/dL — ABNORMAL LOW (ref 12.0–15.0)
MCH: 32 pg (ref 26.0–34.0)
MCHC: 32.8 g/dL (ref 30.0–36.0)
MCV: 97.4 fL (ref 80.0–100.0)
Platelets: 181 K/uL (ref 150–400)
RBC: 2.66 MIL/uL — ABNORMAL LOW (ref 3.87–5.11)
RDW: 15.5 % (ref 11.5–15.5)
WBC: 3.5 K/uL — ABNORMAL LOW (ref 4.0–10.5)
nRBC: 0 % (ref 0.0–0.2)

## 2024-07-25 LAB — CYCLOSPORINE: Cyclosporine, LabCorp: 150 ng/mL (ref 100–400)

## 2024-07-25 MED ORDER — ANTICOAGULANT SODIUM CITRATE 4% (200MG/5ML) IV SOLN: 5.0000 mL | Status: DC | PRN

## 2024-07-25 MED ORDER — ALTEPLASE 2 MG IJ SOLR: 2.0000 mg | Freq: Once | INTRAMUSCULAR | Status: DC | PRN

## 2024-07-25 MED ORDER — HYDRALAZINE HCL 25 MG PO TABS
25.0000 mg | ORAL_TABLET | Freq: Three times a day (TID) | ORAL | Status: DC
  Administered 2024-07-25 – 2024-08-05 (×30): 25 mg via ORAL
  Filled 2024-07-25 (×31): qty 1

## 2024-07-25 MED ORDER — LIDOCAINE-PRILOCAINE 2.5-2.5 % EX CREA
1.0000 | TOPICAL_CREAM | CUTANEOUS | Status: DC | PRN
Start: 2024-07-25 — End: 2024-07-26

## 2024-07-25 MED ORDER — LIDOCAINE HCL (PF) 1 % IJ SOLN: 5.0000 mL | INTRAMUSCULAR | Status: DC | PRN

## 2024-07-25 MED ORDER — HEPARIN SODIUM (PORCINE) 1000 UNIT/ML DIALYSIS: 1000.0000 [IU] | INTRAMUSCULAR | Status: DC | PRN

## 2024-07-25 MED ORDER — PENTAFLUOROPROP-TETRAFLUOROETH EX AERO
1.0000 | INHALATION_SPRAY | CUTANEOUS | Status: DC | PRN
Start: 2024-07-25 — End: 2024-07-26

## 2024-07-25 NOTE — Progress Notes (Signed)
 PROGRESS NOTE  [name] [name] FMW:[id number] DOB: 10-May-1981 DOA: 05/19/2024 PCP: [name]   LOS: 67 days   Brief narrative:  Patient is a  43-yrs-old female with past medical history of hypertension, CVA, type 1 diabetes, gastroparesis, kidney and pancreatic transplant 2014 with subsequent failure of renal transplant now on end-stage renal dialysis, anxiety, narcotic abuse presented to the hospital from dialysis unit on 05/19/2024 as code stroke.  Patient was noted to have hemorrhagic stroke.  Neurology, neurosurgery in critical care were consulted.  EVD was placed on the left side and was initially admitted to neuro ICU.  She was also intubated and had prolonged intubation.  Subsequently, patient was extubated on 05/27/2024.  Important events:  9/15 admitted with ICH plus IVH, EVD was placed for hydrocephalus, intubated 9/22 palliative care meeting.  Family is insistent that they want full scope of care 9/23 Extubated 10/1 -developed fevers pancultures drawn and broad-spectrum antibiotics started 10/2 became afebrile after starting antibiotics, did not tolerate raising EVD 10/6 EVD was raised from 10 to 30 cm water , tolerating well, remained afebrile 10/7 EVD is at 30 cm water , CT scan will be done tomorrow morning, no overnight issues 10/8 she became somnolent, CT head showed increasing hydrocephalus, EVD was titrated down to 10 cm of water  with improvement in mental status 10/15 VP shunt placement 10/17 transferred to progressive unit with [name]  11/18-medically stable.  Awaiting for disposition.  Insurance declined LTAC and skilled nursing facility, appeal pending.  Assessment/Plan: Principal Problem:   Hemorrhagic stroke [facility]) Active Problems:   Moderate protein-calorie malnutrition   Pressure injury of skin   Nontraumatic subcortical hemorrhage of right cerebral hemisphere [facility])  Acute right parietotemporal intraparenchymal hemorrhage / Intraventricular  hemorrhage: Uncontrolled hypertension: Obstructive hydrocephalus s/p EVD changed to VP shunt: Seizure disorder S/p VP shunt placement on 06/18/2024 by Dr. Mavis.  Continue Vimpat .  Neurosurgery intermittently followed the patient during hospitalization.  Currently stable   ESRD on dialysis: History of failed renal transplant: Secondary hyperparathyroidism: Nephrology on board.  Continue CellCept  and prednisone .  On Monday, Wednesday, Friday hemodialysis schedule.  TOC working on outpatient hemodialysis set up  Acute bronchitis with fever: Resolved   History of pancreatic/kidney transplantation:  Continue cyclosporine , mycophenolate , and prednisone .   Hypertension:  Currently on Coreg , amlodipine  and clonidine  on nondialysis days.  Blood pressure still elevated.  Will change to hydralazine  as needed to 25 3 times daily for now.  Monitor blood pressure and adjust as necessary   Anemia of chronic disease:  Latest hemoglobin  8.5.SABRATransfuse if Hb below 7.0   Type 1 diabetes with vasculopathy/gastroparesis:  Not on long-term insulin  anymore due to pancreatic transplant. On sliding scale at this time.   Moderate protein calorie malnutrition/dysphagia:  On PEG tube feeding..  Patient swallow has reassessed the patient 07/24/2024 at the request of the mother for oral intake and still recommending NPO.   Stage I sacral pressure ulcer present on admission: Continue wound care   Debility/deconditioning/disposition/goals of care:  Palliative care had seen the patient during hospitalization.  Family has insisted full code.   Palliative care has signed off 10/17.  Disposition pending.  TOC on board.  Patient has been seen by physical therapy during hospitalization and patient requiring significant help  DVT prophylaxis: SCDs Start: 05/19/24 1724   Disposition: Long-term care/skilled nursing facility.  Disposition pending- Insurance declined LTAC and skilled nursing facility, appeal pending.   TOC on board  Status is: Inpatient  Remains inpatient appropriate because: Uncertain disposition, need  for hemodialysis set up, TOC on board    Code Status:     Code Status: Full Code  Family Communication:  Spoke with the patient's mother at bedside on 07/24/2024.  Consultants: Nephrology Neurosurgery  Procedures: Ventriculoperitoneal shunt placement Hemodialysis  Anti-infectives:  None currently   Subjective: Today, patient was seen and examined at bedside.  Patient denies shortness of breath dyspnea cough chest pain nausea or vomiting today.  Patient's mother at bedside.  Had swallow evaluation done yesterday and has been recommended NPO.   Objective: Vitals:   07/25/24 0335 07/25/24 0751  BP: (!) 163/69 (!) 182/78  Pulse: 77 80  Resp: 17   Temp: 99.4 F (37.4 C) 97.6 F (36.4 C)  SpO2: 100% 100%    Intake/Output Summary (Last 24 hours) at 07/25/2024 1021 Last data filed at 07/25/2024 0900 Gross per 24 hour  Intake 150 ml  Output 0 ml  Net 150 ml    Filed Weights   07/23/24 0749 07/23/24 1144 07/25/24 0500  Weight: 55.6 kg 55.2 kg 55.2 kg   Body mass index is 21.56 kg/m.   Physical Exam:  General:  Average built, not in obvious distress, chronically ill and deconditioned, Communicative, HENT:   No scleral pallor or icterus noted. Oral mucosa is moist.  Right side of the head with scar from the shunt surgery. Chest:  Clear breath sounds. No crackles or wheezes.  CVS: S1 &S2 heard. No murmur.  Regular rate and rhythm. Abdomen: Soft, nontender, nondistended.  Bowel sounds are heard.  PEG tube in place Extremities: No cyanosis, clubbing or edema.  Right upper extremity AV fistula. Psych: Alert, awake and oriented, normal mood CNS:  No cranial nerve deficits.  Weakness of the lower extremities. Skin: Warm and dry.  No rashes noted.   Data Review: I have personally reviewed the following laboratory data and studies,  CBC: Recent Labs  Lab  08-14-24 0907 07/23/24 0455 07/25/24 0841  WBC 5.4 3.5* 3.5*  HGB 7.8* 8.5* 8.5*  HCT 23.9* 25.9* 25.9*  MCV 96.4 96.6 97.4  PLT 194 167 181   Basic Metabolic Panel: Recent Labs  Lab Aug 14, 2024 0906 07/23/24 0455 07/25/24 0131  NA 126* 127* 127*  K 4.7 5.1 4.5  CL 84* 86* 87*  CO2 26 25 27   GLUCOSE 122* 91 110*  BUN 81* 58* 51*  CREATININE 4.87* 4.07* 3.54*  CALCIUM  10.5* 10.7* 10.5*  MG  --  3.2*  --   PHOS 3.1  --  3.8   Liver Function Tests: Recent Labs  Lab Aug 14, 2024 0906 07/25/24 0131  ALBUMIN  2.8* 3.1*   No results for input(s): LIPASE, AMYLASE in the last 168 hours. No results for input(s): AMMONIA in the last 168 hours. Cardiac Enzymes: No results for input(s): CKTOTAL, CKMB, CKMBINDEX, TROPONINI in the last 168 hours. BNP (last 3 results) Recent Labs    02/14/24 0949  BNP 1,486.6*    ProBNP (last 3 results) No results for input(s): PROBNP in the last 8760 hours.  CBG: Recent Labs  Lab 2024-08-14 1625 08/14/24 2042 07/22/24 0002 07/22/24 0413 07/22/24 0731  GLUCAP 85 171* 161* 99 109*   No results found for this or any previous visit (from the past 240 hours).   Studies: No results found.    Vernal Alstrom, MD  [name] 07/25/2024  If 7PM-7AM, please contact night-coverage

## 2024-07-25 NOTE — Progress Notes (Signed)
 [facility] Progress Note   Subjective:   Seen in room. No new events. No cp, sob.  Dialysis today   Objective Vitals:   07/24/24 2330 07/25/24 0335 07/25/24 0500 07/25/24 0751  BP: (!) 163/78 (!) 163/69  (!) 182/78  Pulse: 74 77  80  Resp: 16 17    Temp: 97.7 F (36.5 C) 99.4 F (37.4 C)  97.6 F (36.4 C)  TempSrc:    Oral  SpO2: 100% 100%  100%  Weight:   55.2 kg   Height:       Physical Exam General: alert female in NAD Heart: RRR, no rub  Lungs: CTA bilaterally, respirations unlabored Abdomen: Soft, non-tender  Extremities: no edema b/l lower extremities Dialysis Access: AVF + t/b  Additional Objective Labs: Basic Metabolic Panel: Recent Labs  Lab 07/21/24 0906 07/23/24 0455 07/25/24 0131  NA 126* 127* 127*  K 4.7 5.1 4.5  CL 84* 86* 87*  CO2 26 25 27   GLUCOSE 122* 91 110*  BUN 81* 58* 51*  CREATININE 4.87* 4.07* 3.54*  CALCIUM  10.5* 10.7* 10.5*  PHOS 3.1  --  3.8   Liver Function Tests: Recent Labs  Lab 07/21/24 0906 07/25/24 0131  ALBUMIN  2.8* 3.1*   No results for input(s): LIPASE, AMYLASE in the last 168 hours. CBC: Recent Labs  Lab 07/21/24 0907 07/23/24 0455 07/25/24 0841  WBC 5.4 3.5* 3.5*  HGB 7.8* 8.5* 8.5*  HCT 23.9* 25.9* 25.9*  MCV 96.4 96.6 97.4  PLT 194 167 181   Blood Culture    Component Value Date/Time   SDES CSF 06/11/2024 0920   SPECREQUEST LP 06/11/2024 0920   CULT  06/11/2024 0920    NO GROWTH 3 DAYS Performed at [facility], [address] [place], [name] [address]    REPTSTATUS 06/14/2024 FINAL 06/11/2024 0920    Cardiac Enzymes: No results for input(s): CKTOTAL, CKMB, CKMBINDEX, TROPONINI in the last 168 hours. CBG: Recent Labs  Lab 07/21/24 1625 07/21/24 2042 07/22/24 0002 07/22/24 0413 07/22/24 0731  GLUCAP 85 171* 161* 99 109*   Iron  Studies: No results for input(s): IRON , TIBC, TRANSFERRIN, FERRITIN in the last 72  hours. @lablastinr3 @ Studies/Results: No results found.  Medications:  anticoagulant sodium citrate        carvedilol   25 mg Per Tube BID WC   Chlorhexidine  Gluconate Cloth  6 each Topical Q0600   cinacalcet   30 mg Oral Q M,W,F   cycloSPORINE   200 mg Per Tube QPM   cycloSPORINE   225 mg Per Tube Daily   darbepoetin (ARANESP ) injection - DIALYSIS  200 mcg Subcutaneous Q Thu-1800   famotidine   10 mg Per Tube QHS   feeding supplement (NEPRO CARB STEADY)  1,000 mL Per Tube Q24H   fiber supplement (BANATROL TF)  60 mL Per Tube TID   gabapentin   100 mg Per Tube Daily   Gerhardt's butt cream   Topical BID   lacosamide   100 mg Per Tube BID   liver oil-zinc  oxide   Topical TID   losartan   25 mg Per Tube Q1200   multivitamin with minerals  1 tablet Per Tube Daily   mycophenolate   500 mg Per Tube BID   mouth rinse  15 mL Mouth Rinse 4 times per day   oxidized cellulose  1 each Topical Once   pantoprazole  (PROTONIX ) IV  40 mg Intravenous Daily   predniSONE   15 mg Per Tube Q breakfast    Dialysis Orders: 3:45hr, 400/A1.5, EDW 55.2kg,  2K/2Ca bath, AVF, no heparin  - Prev on Mircera 150mcg IV q 2 weeks - No VDRA  Assessment/Plan: Acute R intracranial hemorrhage: 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 Needs ongoing rehab. ESRD: Continue HD on MWF schedule HTN/volume: BP much better - actually had some hypotension with HD. UF as tolerated. Off amlodipine , now on Coreg  25mg  bid, Losartan  25 mg, clonidine  down to 0.1mg  daily- taper off as able. Anemia of ESRD: Hgb 7s, transfuse prn. Continue Aranesp  200 q Thurs Secondary HPTH: CorrCa high, Phos low. Continue sensipar , not on VDRA. Nutrition: Alb low, Remains NPO. continue Nepro TF. Possible repeat swallowing study next week.  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 Dispo: Insurance declined LTAC, and SNF, appeal pending. Tolerated HD in recliner on multiple days  Maisie Ronnald Acosta PA-C [facility] 07/25/2024,9:27 AM

## 2024-07-25 NOTE — Progress Notes (Addendum)
 SLP Cancellation Note  Patient Details Name: [name] [name] MRN: [id number] DOB: [birth year]   Cancelled treatment:       Reason Eval/Treat Not Completed: Patient at procedure or test/unavailable. SLP, RN, and radiology attempted to coordinate throughout the day for completion of MBS but pt is not back from HD yet. Will need to f/u on subsequent date for completion as scheduling allows, likely at some point after the weekend.    Leita SAILOR., M.A. CCC-SLP Acute Rehabilitation Services Office: [phone]  Secure chat preferred  07/25/2024, 2:32 PM

## 2024-07-25 NOTE — Procedures (Signed)
 Received patient in bed to unit.  Alert and oriented.  Informed consent signed and in chart.  All procedures explained. RUA AVF cannulated x 2 with 15g needles per policy, without difficulty. Tx initiated per order. UF goal 3000 ml. 1200 B/P trending down, Uf goal reduced to 2500 ml. 1207 B/P trending down, 100 ml NS given, UF goal reduced to 2100 ml. B/P trending down, 100 ml NS given, UF goal reduced to 1700 ml.  TX duration:3.5 hours  Tx complete, blood returned. Needles removed, sites held x 2 until hemostasis achieved. Gauze changed prior to taping. Pt was hypotensive during tx, responded well to interventions.  Transported back to the room  Alert, without acute distress.  Hand-off given to patient's nurse.   Access used: RUA AVF Access issues: none  Total UF removed: 1700 ml Medication(s) given: See MAR   Powell LITTIE [facility]

## 2024-07-25 NOTE — Plan of Care (Signed)
 Problem: Education: Goal: Ability to describe self-care measures that may prevent or decrease complications (Diabetes Survival Skills Education) will improve 07/25/2024 0956 by Johnie Will HERO, RN Outcome: Progressing 07/25/2024 0955 by Johnie Will HERO, RN Outcome: Progressing   Problem: Coping: Goal: Ability to adjust to condition or change in health will improve 07/25/2024 0956 by Johnie Will HERO, RN Outcome: Progressing 07/25/2024 0955 by Johnie Will HERO, RN Outcome: Progressing   Problem: Fluid Volume: Goal: Ability to maintain a balanced intake and output will improve Outcome: Progressing   Problem: Health Behavior/Discharge Planning: Goal: Ability to identify and utilize available resources and services will improve 07/25/2024 0956 by Johnie Will HERO, RN Outcome: Progressing 07/25/2024 0955 by Johnie Will HERO, RN Outcome: Progressing Goal: Ability to manage health-related needs will improve 07/25/2024 0956 by Johnie Will HERO, RN Outcome: Progressing 07/25/2024 0955 by Johnie Will HERO, RN Outcome: Progressing   Problem: Metabolic: Goal: Ability to maintain appropriate glucose levels will improve 07/25/2024 0956 by Johnie Will HERO, RN Outcome: Progressing 07/25/2024 0955 by Johnie Will HERO, RN Outcome: Progressing   Problem: Nutritional: Goal: Maintenance of adequate nutrition will improve 07/25/2024 0956 by Johnie Will HERO, RN Outcome: Progressing 07/25/2024 0955 by Johnie Will HERO, RN Outcome: Progressing Goal: Progress toward achieving an optimal weight will improve 07/25/2024 0956 by Johnie Will HERO, RN Outcome: Progressing 07/25/2024 0955 by Johnie Will HERO, RN Outcome: Progressing   Problem: Tissue Perfusion: Goal: Adequacy of tissue perfusion will improve 07/25/2024 0956 by Johnie Will HERO, RN Outcome: Progressing 07/25/2024 0955 by Johnie Will HERO, RN Outcome: Progressing   Problem:  Education: Goal: Knowledge of disease or condition will improve 07/25/2024 0956 by Johnie Will HERO, RN Outcome: Progressing 07/25/2024 0955 by Johnie Will HERO, RN Outcome: Progressing Goal: Knowledge of secondary prevention will improve (MUST DOCUMENT ALL) 07/25/2024 0956 by Johnie Will HERO, RN Outcome: Progressing 07/25/2024 0955 by Johnie Will HERO, RN Outcome: Progressing Goal: Knowledge of patient specific risk factors will improve (DELETE if not current risk factor) 07/25/2024 0956 by Johnie Will HERO, RN Outcome: Progressing 07/25/2024 0955 by Johnie Will HERO, RN Outcome: Progressing   Problem: Intracerebral Hemorrhage Tissue Perfusion: Goal: Complications of Intracerebral Hemorrhage will be minimized 07/25/2024 0956 by Johnie Will HERO, RN Outcome: Progressing 07/25/2024 0955 by Johnie Will HERO, RN Outcome: Progressing   Problem: Coping: Goal: Will verbalize positive feelings about self 07/25/2024 0956 by Johnie Will HERO, RN Outcome: Progressing 07/25/2024 0955 by Johnie Will HERO, RN Outcome: Progressing Goal: Will identify appropriate support needs 07/25/2024 0956 by Johnie Will HERO, RN Outcome: Progressing 07/25/2024 0955 by Johnie Will HERO, RN Outcome: Progressing   Problem: Health Behavior/Discharge Planning: Goal: Ability to manage health-related needs will improve 07/25/2024 0956 by Johnie Will HERO, RN Outcome: Progressing 07/25/2024 0955 by Johnie Will HERO, RN Outcome: Progressing Goal: Goals will be collaboratively established with patient/family 07/25/2024 [phone] by Johnie Will HERO, RN Outcome: Progressing 07/25/2024 0955 by Johnie Will HERO, RN Outcome: Progressing   Problem: Self-Care: Goal: Ability to participate in self-care as condition permits will improve 07/25/2024 0956 by Johnie Will HERO, RN Outcome: Progressing 07/25/2024 0955 by Johnie Will HERO, RN Outcome:  Progressing Goal: Verbalization of feelings and concerns over difficulty with self-care will improve 07/25/2024 0956 by Johnie Will HERO, RN Outcome: Progressing 07/25/2024 0955 by Johnie Will HERO, RN Outcome: Progressing Goal: Ability to communicate needs accurately will improve 07/25/2024 0956 by Johnie Will HERO, RN Outcome: Progressing 07/25/2024 0955 by Johnie Will HERO, RN Outcome: Progressing   Problem: Nutrition: Goal:  Risk of aspiration will decrease 07/25/2024 0956 by Johnie Will HERO, RN Outcome: Progressing 07/25/2024 0955 by Johnie Will HERO, RN Outcome: Progressing Goal: Dietary intake will improve 07/25/2024 0956 by Johnie Will HERO, RN Outcome: Progressing 07/25/2024 0955 by Johnie Will HERO, RN Outcome: Progressing   Problem: Education: Goal: Knowledge of General Education information will improve Description: Including pain rating scale, medication(s)/side effects and non-pharmacologic comfort measures 07/25/2024 0956 by Johnie Will HERO, RN Outcome: Progressing 07/25/2024 0955 by Johnie Will HERO, RN Outcome: Progressing   Problem: Health Behavior/Discharge Planning: Goal: Ability to manage health-related needs will improve 07/25/2024 0956 by Johnie Will HERO, RN Outcome: Progressing 07/25/2024 0955 by Johnie Will HERO, RN Outcome: Progressing   Problem: Clinical Measurements: Goal: Ability to maintain clinical measurements within normal limits will improve 07/25/2024 0956 by Johnie Will HERO, RN Outcome: Progressing 07/25/2024 0955 by Johnie Will HERO, RN Outcome: Progressing Goal: Will remain free from infection 07/25/2024 0956 by Johnie Will HERO, RN Outcome: Progressing 07/25/2024 0955 by Johnie Will HERO, RN Outcome: Progressing Goal: Diagnostic test results will improve 07/25/2024 0956 by Johnie Will HERO, RN Outcome: Progressing 07/25/2024 0955 by Johnie Will HERO, RN Outcome:  Progressing Goal: Respiratory complications will improve 07/25/2024 0956 by Johnie Will HERO, RN Outcome: Progressing 07/25/2024 0955 by Johnie Will HERO, RN Outcome: Progressing Goal: Cardiovascular complication will be avoided 07/25/2024 0956 by Johnie Will HERO, RN Outcome: Progressing 07/25/2024 0955 by Johnie Will HERO, RN Outcome: Progressing   Problem: Activity: Goal: Risk for activity intolerance will decrease Outcome: Progressing   Problem: Nutrition: Goal: Adequate nutrition will be maintained Outcome: Progressing   Problem: Coping: Goal: Level of anxiety will decrease Outcome: Progressing   Problem: Elimination: Goal: Will not experience complications related to bowel motility Outcome: Progressing   Problem: Pain Managment: Goal: General experience of comfort will improve and/or be controlled Outcome: Progressing   Problem: Safety: Goal: Ability to remain free from injury will improve Outcome: Progressing

## 2024-07-25 NOTE — Plan of Care (Signed)
  Problem: Education: Goal: Ability to describe self-care measures that may prevent or decrease complications (Diabetes Survival Skills Education) will improve Outcome: Progressing   Problem: Education: Goal: Individualized Educational Video(s) Outcome: Progressing   Problem: Coping: Goal: Ability to adjust to condition or change in health will improve Outcome: Progressing   Problem: Health Behavior/Discharge Planning: Goal: Ability to identify and utilize available resources and services will improve Outcome: Progressing

## 2024-07-26 DIAGNOSIS — I619 Nontraumatic intracerebral hemorrhage, unspecified: Secondary | ICD-10-CM | POA: Diagnosis not present

## 2024-07-26 DIAGNOSIS — E440 Moderate protein-calorie malnutrition: Secondary | ICD-10-CM | POA: Diagnosis not present

## 2024-07-26 DIAGNOSIS — I610 Nontraumatic intracerebral hemorrhage in hemisphere, subcortical: Secondary | ICD-10-CM | POA: Diagnosis not present

## 2024-07-26 MED ORDER — CHLORHEXIDINE GLUCONATE CLOTH 2 % EX PADS
6.0000 | MEDICATED_PAD | Freq: Every day | CUTANEOUS | Status: DC
  Administered 2024-07-27 – 2024-07-30 (×4): 6 via TOPICAL

## 2024-07-26 NOTE — Progress Notes (Signed)
 [facility] Progress Note   Subjective:   Seen and examined at bedside.  Feeling ok this AM.  Admits to intermittent HA. Tolerating dialysis well.  Denies CP, SOB, abdominal pain, weakness, dizziness and fatigue.    Objective Vitals:   07/26/24 0500 07/26/24 0525 07/26/24 0736 07/26/24 1206  BP:  (!) 143/66 (!) 140/60 (!) 143/71  Pulse:   80 77  Resp:   16 16  Temp:   98.3 F (36.8 C) 97.7 F (36.5 C)  TempSrc:   Oral Oral  SpO2:   100% 100%  Weight: 52.5 kg     Height:       Physical Exam General:alert female in NAD Heart:RRR Lungs:CTAB, nml WOB on RA Abdomen:soft, NTND Extremities:no LE edema Dialysis Access: AVF +b/t   Filed Weights   07/25/24 1050 07/25/24 1456 07/26/24 0500  Weight: 56.7 kg 55.2 kg 52.5 kg    Intake/Output Summary (Last 24 hours) at 07/26/2024 1431 Last data filed at 07/25/2024 2000 Gross per 24 hour  Intake 0 ml  Output 1500 ml  Net -1500 ml    Additional Objective Labs: Basic Metabolic Panel: Recent Labs  Lab 07/21/24 0906 07/23/24 0455 07/25/24 0131  NA 126* 127* 127*  K 4.7 5.1 4.5  CL 84* 86* 87*  CO2 26 25 27   GLUCOSE 122* 91 110*  BUN 81* 58* 51*  CREATININE 4.87* 4.07* 3.54*  CALCIUM  10.5* 10.7* 10.5*  PHOS 3.1  --  3.8   Liver Function Tests: Recent Labs  Lab 07/21/24 0906 07/25/24 0131  ALBUMIN  2.8* 3.1*   CBC: Recent Labs  Lab 07/21/24 0907 07/23/24 0455 07/25/24 0841  WBC 5.4 3.5* 3.5*  HGB 7.8* 8.5* 8.5*  HCT 23.9* 25.9* 25.9*  MCV 96.4 96.6 97.4  PLT 194 167 181    Medications:  anticoagulant sodium citrate       carvedilol   25 mg Per Tube BID WC   Chlorhexidine  Gluconate Cloth  6 each Topical Q0600   cinacalcet   30 mg Oral Q M,W,F   cycloSPORINE   200 mg Per Tube QPM   cycloSPORINE   225 mg Per Tube Daily   darbepoetin (ARANESP ) injection - DIALYSIS  200 mcg Subcutaneous Q Thu-1800   famotidine   10 mg Per Tube QHS   feeding supplement (NEPRO CARB STEADY)  1,000 mL Per Tube Q24H    fiber supplement (BANATROL TF)  60 mL Per Tube TID   gabapentin   100 mg Per Tube Daily   Gerhardt's butt cream   Topical BID   hydrALAZINE   25 mg Oral Q8H   lacosamide   100 mg Per Tube BID   liver oil-zinc  oxide   Topical TID   losartan   25 mg Per Tube Q1200   multivitamin with minerals  1 tablet Per Tube Daily   mycophenolate   500 mg Per Tube BID   mouth rinse  15 mL Mouth Rinse 4 times per day   oxidized cellulose  1 each Topical Once   pantoprazole  (PROTONIX ) IV  40 mg Intravenous Daily   predniSONE   15 mg Per Tube Q breakfast    Dialysis Orders: 3:45hr, 400/A1.5, EDW 55.2kg, 2K/2Ca bath, AVF, no heparin  - Prev on Mircera 150mcg IV q 2 weeks - No VDRA   Assessment/Plan: Acute R intracranial hemorrhage: 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 Needs ongoing rehab. ESRD:  HD on MWF.  Due to Thanksgiving Holiday, will have dialysis on Sunday, Tuesday and Friday this week.  HTN/volume: BP much better - actually had some  hypotension with HD. UF as tolerated. Off amlodipine , now on Coreg  25mg  bid, Losartan  25 mg, clonidine  down to 0.1mg  daily- taper off as able. Anemia of ESRD: Hgb 8s, transfuse prn. Continue Aranesp  200 q Thurs Secondary HPTH: CorrCa high, phos in goal. Continue sensipar , not on VDRA. Nutrition: Alb low, Remains NPO - to complete MBS objectively evaluate her swallow. continue Nepro TF.  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 Dispo: Insurance declined LTAC, and SNF, appeal pending. Tolerated HD in recliner on multiple days  Manuelita Labella, PA-C [facility] 07/26/2024,2:31 PM  LOS: 68 days

## 2024-07-26 NOTE — Plan of Care (Signed)
  Problem: Education: Goal: Ability to describe self-care measures that may prevent or decrease complications (Diabetes Survival Skills Education) will improve Outcome: Progressing   Problem: Education: Goal: Individualized Educational Video(s) Outcome: Progressing   Problem: Fluid Volume: Goal: Ability to maintain a balanced intake and output will improve Outcome: Progressing   Problem: Health Behavior/Discharge Planning: Goal: Ability to manage health-related needs will improve Outcome: Progressing

## 2024-07-26 NOTE — Progress Notes (Signed)
 Occupational Therapy Treatment Patient Details Name: [name] [name] MRN: [id number] DOB: Oct 31, 1980 Today's Date: 07/26/2024   History of present illness 43 yo female presents 05/19/24 for left gaze and L side weakness. 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 Intubated 9/15-9/23. EVD 9/15- 10/15; s/p shunt 10/15. S/p laparoscopy with intra-abdominal assistance ventricular-peritoneal shunt placement 10/15. PMH: HTN, ESRD on HD s/p renal and pancreatic transplants, DM, gastroparesis, multiple strokes (ischemic and hemorrhagic) with prepontine SAH, hydrocephalus requiring EVD placement   OT comments  Pt greeted seated EOB, mother at bedside. Agreeable for OT session. Pt making great progress towards functional goals today, AMPAC ADL 15/24 (up from 12/24 on previous visit). She completed LB dressing with min A and needed up to min A overall for all mobility and transfers with RW. Still remains with intermittent R lateral lean and difficulty navigating obstacles in L environment. Although L inattention improving overall. Left upright in chair with all needs met. OT to continue to follow.      If plan is discharge home, recommend the following:  Assistance with cooking/housework;Assist for transportation;Help with stairs or ramp for entrance;A little help with walking and/or transfers;Direct supervision/assist for medications management;Direct supervision/assist for financial management;Supervision due to cognitive status;A little help with bathing/dressing/bathroom   Equipment Recommendations  Other (comment) (defer)    Recommendations for Other Services      Precautions / Restrictions Precautions Precautions: Fall Recall of Precautions/Restrictions: Impaired Precaution/Restrictions Comments: PEG Restrictions Weight Bearing Restrictions Per Provider Order: No        Mobility Bed Mobility               General bed mobility comments: not assessed - pt received seated EOB    Transfers Overall transfer level: Needs assistance Equipment used: Rolling walker (2 wheels) Transfers: Sit to/from Stand, Bed to chair/wheelchair/BSC Sit to Stand: Min assist     Step pivot transfers: Min assist     General transfer comment: STS from EOB with cues for hand placement and powering up. Ambulated in room with RW and min A with VC to maintain safe proximity to RW and cues to navigate around obstacles.     Balance Overall balance assessment: Needs assistance Sitting-balance support: No upper extremity supported, Feet supported Sitting balance-Leahy Scale: Fair Sitting balance - Comments: seated EOB, occasionally leaning to the L, needs assist to power up back into midline Postural control: Right lateral lean Standing balance support: Bilateral upper extremity supported, During functional activity, Reliant on assistive device for balance Standing balance-Leahy Scale: Poor Standing balance comment: reliant on RW, occasionally leaning to the R, poor proximity to AD needing frequent cues to maintain                           ADL either performed or assessed with clinical judgement   ADL   Eating/Feeding:  (PEG)                   Lower Body Dressing: Contact guard assist;Sitting/lateral leans;Sit to/from stand Lower Body Dressing Details (indicate cue type and reason): donned/doffed B socks via figure four technique, needs stability assist in stance during pants hike Toilet Transfer: Minimal assistance;Ambulation;Regular Toilet;Rolling walker (2 wheels) Toilet Transfer Details (indicate cue type and reason): cues for safe approach and to use GBs to assist in standing, needs A to power up from lower surface height.  Functional mobility during ADLs: Minimal assistance;Rolling walker (2 wheels);Cueing for safety       Extremity/Trunk Assessment Upper Extremity Assessment LUE Deficits / Details: using B hands/UE's together during functional tasks            Vision       Perception Perception Perception-Other Comments: L inattention improving, but still remains (bumping into furniture/obstacles in room located in L environment)   Praxis     Communication Communication Communication: No apparent difficulties   Cognition Arousal: Alert Behavior During Therapy: WFL for tasks assessed/performed Cognition: Cognition impaired   Orientation impairments:  (not formally assessed this date) Awareness: Intellectual awareness impaired (improving) Memory impairment (select all impairments): Short-term memory, Working memory Attention impairment (select first level of impairment): Sustained attention Executive functioning impairment (select all impairments): Initiation, Organization, Sequencing, Problem solving OT - Cognition Comments: pt perseverating on feeling tired, very participatory with encouragement; still intermittently impulsive                 Following commands: Impaired Following commands impaired: Follows one step commands with increased time, Follows multi-step commands inconsistently      Cueing   Cueing Techniques: Verbal cues, Gestural cues, Visual cues  Exercises      Shoulder Instructions       General Comments supportive mother present during session    Pertinent Vitals/ Pain       Pain Assessment Pain Assessment: Faces Faces Pain Scale: No hurt  Home Living                                          Prior Functioning/Environment              Frequency  Min 2X/week        Progress Toward Goals  OT Goals(current goals can now be found in the care plan section)  Progress towards OT goals: Progressing toward goals     Plan      Co-evaluation                 AM-PAC OT 6 Clicks Daily Activity     Outcome Measure    Help from another person eating meals?: A Lot (PEG) Help from another person taking care of personal grooming?: A Little Help from another person toileting, which includes using toliet, bedpan, or urinal?: A Lot Help from another person bathing (including washing, rinsing, drying)?: A Lot Help from another person to put on and taking off regular upper body clothing?: A Little Help from another person to put on and taking off regular lower body clothing?: A Little 6 Click Score: 15    End of Session Equipment Utilized During Treatment: Gait belt;Rolling walker (2 wheels)  OT Visit Diagnosis: Unsteadiness on feet (R26.81);Muscle weakness (generalized) (M62.81);History of falling (Z91.81);Other symptoms and signs involving cognitive function;Cognitive communication deficit (R41.841);Low vision, both eyes (H54.2) Symptoms and signs involving cognitive functions: Nontraumatic SAH   Activity Tolerance Patient tolerated treatment well   Patient Left in chair;with call bell/phone within reach;with chair alarm set;with nursing/sitter in room;with family/visitor present   Nurse Communication Mobility status        Time: 9075-9060 OT Time Calculation (min): 15 min  Charges: OT General Charges $OT Visit: 1 Visit OT Treatments $Self Care/Home Management : 8-22 mins  Rami Budhu M. [place], OTR/L [facility] [phone] Secure Chat Preferred  [name] [name]  07/26/2024, 10:40 AM

## 2024-07-26 NOTE — Progress Notes (Signed)
 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  Patient was noted to have hemorrhagic stroke.  Neurology, neurosurgery in critical care were consulted.  EVD was placed on the left side and was initially admitted to neuro ICU.  She was also intubated and had prolonged intubation.  Subsequently, patient was extubated on 05/27/2024.   Important events:  9/15 admitted with ICH plus IVH, EVD was placed for hydrocephalus, intubated 9/22 palliative care meeting.  Family is insistent that they want full scope of care 9/23 Extubated 10/1 -developed fevers pancultures drawn and broad-spectrum antibiotics started 10/2 became afebrile after starting antibiotics, did not tolerate raising EVD 10/6 EVD was raised from 10 to 30 cm water , tolerating well, remained afebrile 10/7 EVD is at 30 cm water , CT scan will be done tomorrow morning, no overnight issues 10/8 she became somnolent, CT head showed increasing hydrocephalus, EVD was titrated down to 10 cm of water  with improvement in mental status 10/15 VP shunt placement 10/17 transferred to progressive unit with [name]  11/18-medically stable.  Awaiting for disposition.  Insurance declined LTAC and skilled nursing facility, appeal pending.    Assessment/Plan:   Acute right parietotemporal intraparenchymal hemorrhage / Intraventricular hemorrhage: Uncontrolled hypertension: Obstructive hydrocephalus s/p EVD changed to VP shunt: Seizure disorder S/p VP shunt placement on 06/18/2024 by Dr. Mavis.  Continue Vimpat .   Neurosurgery intermittently followed the  patient during hospitalization.   Currently stable   ESRD on dialysis: History of failed renal transplant: Secondary hyperparathyroidism: Nephrology on board.  Continue CellCept  and prednisone .   -Calcium  is high, phosphorus low, patient is on Sensipar  On Monday, Wednesday, Friday hemodialysis schedule.   TOC is working on finding outpatient hemodialysis set up   Acute bronchitis with fever:  Resolved   History of pancreatic/kidney transplantation:  Continue cyclosporine , mycophenolate , and prednisone .   Hypertension:  -Blood pressure is stable -Continue Coreg , amlodipine  -Continue clonidine  on hemodialysis days -Started on hydralazine  25 mg p.o. every 8 hours    Anemia of chronic disease:  Latest hemoglobin  8.5.. Transfuse if Hb below 7.0   Type 1 diabetes with vasculopathy/gastroparesis:  Not on long-term insulin  anymore due to pancreatic transplant.  -She is currently n.p.o. for swallow evaluation/MBS Check CBG every 8 hours   Moderate protein calorie malnutrition/dysphagia:  On PEG tube feeding..   Patient swallow has reassessed the patient 07/24/2024 at the request of the mother for oral intake and still recommending NPO.   Stage I sacral pressure ulcer present on admission:  Continue wound care   Debility/deconditioning/disposition/goals of care:  Palliative care had seen the patient during hospitalization.  Family has insisted full code.   Palliative care has signed off 10/17.  Disposition pending.  TOC on board.  Patient has been seen by physical therapy during hospitalization and patient requiring significant help      DVT prophylaxis: SCDs  Medications     carvedilol   25 mg Per Tube BID WC   Chlorhexidine  Gluconate Cloth  6 each Topical Q0600   cinacalcet   30 mg Oral Q M,W,F   cycloSPORINE   200 mg Per Tube QPM   cycloSPORINE   225 mg Per Tube Daily  darbepoetin (ARANESP ) injection - DIALYSIS  200 mcg Subcutaneous Q Thu-1800   famotidine   10 mg Per  Tube QHS   feeding supplement (NEPRO CARB STEADY)  1,000 mL Per Tube Q24H   fiber supplement (BANATROL TF)  60 mL Per Tube TID   gabapentin   100 mg Per Tube Daily   Gerhardt's butt cream   Topical BID   hydrALAZINE   25 mg Oral Q8H   lacosamide   100 mg Per Tube BID   liver oil-zinc  oxide   Topical TID   losartan   25 mg Per Tube Q1200   multivitamin with minerals  1 tablet Per Tube Daily   mycophenolate   500 mg Per Tube BID   mouth rinse  15 mL Mouth Rinse 4 times per day   oxidized cellulose  1 each Topical Once   pantoprazole  (PROTONIX ) IV  40 mg Intravenous Daily   predniSONE   15 mg Per Tube Q breakfast     Data Reviewed:   CBG:  Recent Labs  Lab 07/21/24 1625 07/21/24 2042 07/22/24 0002 07/22/24 0413 07/22/24 0731  GLUCAP 85 171* 161* 99 109*    SpO2: 100 % O2 Flow Rate (L/min): 100 L/min FiO2 (%): 40 %    Vitals:   07/26/24 0410 07/26/24 0500 07/26/24 0525 07/26/24 0736  BP: (!) 167/71  (!) 143/66 (!) 140/60  Pulse: 75   80  Resp: 18   16  Temp: 98.1 F (36.7 C)   98.3 F (36.8 C)  TempSrc: Oral   Oral  SpO2: 100%   100%  Weight:  52.5 kg    Height:          Data Reviewed:  Basic Metabolic Panel: Recent Labs  Lab 07/21/24 0906 07/23/24 0455 07/25/24 0131  NA 126* 127* 127*  K 4.7 5.1 4.5  CL 84* 86* 87*  CO2 26 25 27   GLUCOSE 122* 91 110*  BUN 81* 58* 51*  CREATININE 4.87* 4.07* 3.54*  CALCIUM  10.5* 10.7* 10.5*  MG  --  3.2*  --   PHOS 3.1  --  3.8    CBC: Recent Labs  Lab 07/21/24 0907 07/23/24 0455 07/25/24 0841  WBC 5.4 3.5* 3.5*  HGB 7.8* 8.5* 8.5*  HCT 23.9* 25.9* 25.9*  MCV 96.4 96.6 97.4  PLT 194 167 181    LFT Recent Labs  Lab 07/21/24 0906 07/25/24 0131  ALBUMIN  2.8* 3.1*     Antibiotics: Anti-infectives (From admission, onward)    Start     Dose/Rate Route Frequency Ordered Stop   07/08/24 1530  fluconazole  (DIFLUCAN ) tablet 150 mg        150 mg Oral  Once 07/08/24 1437 07/08/24 1544   06/18/24 2030   ceFAZolin  (ANCEF ) IVPB 2g/100 mL premix  Status:  Discontinued        2 g 200 mL/hr over 30 Minutes Intravenous Every 8 hours 06/18/24 1454 06/19/24 1608   06/18/24 0600  ceFAZolin  (ANCEF ) IVPB 2g/100 mL premix        2 g 200 mL/hr over 30 Minutes Intravenous On call to O.R. 06/15/24 1152 06/18/24 1230   06/06/24 1400  piperacillin -tazobactam (ZOSYN ) IVPB 2.25 g        2.25 g 100 mL/hr over 30 Minutes Intravenous Every 8 hours 06/06/24 1017 06/09/24 0538   06/06/24 1200  vancomycin  (VANCOREADY) IVPB 500 mg/100 mL        500 mg 100 mL/hr over 60 Minutes Intravenous Every M-W-F (Hemodialysis) 06/04/24 1745 06/06/24 1900   06/04/24  1845  vancomycin  (VANCOCIN ) IVPB 1000 mg/200 mL premix        1,000 mg 200 mL/hr over 60 Minutes Intravenous  Once 06/04/24 1745 06/04/24 1915   06/04/24 1830  ceFEPIme  (MAXIPIME ) 2 g in sodium chloride  0.9 % 100 mL IVPB  Status:  Discontinued        2 g 200 mL/hr over 30 Minutes Intravenous Every M-W-F (Hemodialysis) 06/04/24 1743 06/06/24 1017   05/27/24 1015  cefTRIAXone  (ROCEPHIN ) 2 g in sodium chloride  0.9 % 100 mL IVPB        2 g 200 mL/hr over 30 Minutes Intravenous Every 24 hours 05/27/24 0918 05/29/24 1014   05/23/24 1530  ceFEPIme  (MAXIPIME ) 1 g in sodium chloride  0.9 % 100 mL IVPB  Status:  Discontinued        1 g 200 mL/hr over 30 Minutes Intravenous Every 24 hours 05/23/24 1433 05/27/24 0918   05/22/24 2200  Ampicillin -Sulbactam (UNASYN ) 3 g in sodium chloride  0.9 % 100 mL IVPB  Status:  Discontinued        3 g 200 mL/hr over 30 Minutes Intravenous Every 24 hours 05/22/24 0834 05/23/24 1433   05/22/24 0930  Ampicillin -Sulbactam (UNASYN ) 3 g in sodium chloride  0.9 % 100 mL IVPB        3 g 200 mL/hr over 30 Minutes Intravenous  Once 05/22/24 0834 05/22/24 1900        CONSULTS neurosurgery, palliative care  Code Status: Full code  Family Communication: Discussed with patient's family member at bedside     Subjective   Denies any  complaints.  Awaiting modified barium swallow   Objective    Physical Examination:  General-appears in no acute distress Heart-S1-S2, regular, no murmur auscultated Lungs-clear to auscultation bilaterally, no wheezing or crackles auscultated Abdomen-soft, nontender, no organomegaly Extremities-no edema in the lower extremities Neuro-alert, oriented x3, no focal deficit noted           Aviel Davalos S Shasha Buchbinder   [name] If 7PM-7AM, please contact night-coverage at [email or web address], Office  [phone]   07/26/2024, 9:12 AM  LOS: 68 days

## 2024-07-27 DIAGNOSIS — E440 Moderate protein-calorie malnutrition: Secondary | ICD-10-CM | POA: Diagnosis not present

## 2024-07-27 DIAGNOSIS — I619 Nontraumatic intracerebral hemorrhage, unspecified: Secondary | ICD-10-CM | POA: Diagnosis not present

## 2024-07-27 DIAGNOSIS — I610 Nontraumatic intracerebral hemorrhage in hemisphere, subcortical: Secondary | ICD-10-CM | POA: Diagnosis not present

## 2024-07-27 LAB — CBC
HCT: 26.2 % — ABNORMAL LOW (ref 36.0–46.0)
Hemoglobin: 8.5 g/dL — ABNORMAL LOW (ref 12.0–15.0)
MCH: 31.7 pg (ref 26.0–34.0)
MCHC: 32.4 g/dL (ref 30.0–36.0)
MCV: 97.8 fL (ref 80.0–100.0)
Platelets: 169 K/uL (ref 150–400)
RBC: 2.68 MIL/uL — ABNORMAL LOW (ref 3.87–5.11)
RDW: 15.8 % — ABNORMAL HIGH (ref 11.5–15.5)
WBC: 3.7 K/uL — ABNORMAL LOW (ref 4.0–10.5)
nRBC: 0 % (ref 0.0–0.2)

## 2024-07-27 LAB — GLUCOSE, CAPILLARY
Glucose-Capillary: 126 mg/dL — ABNORMAL HIGH (ref 70–99)
Glucose-Capillary: 126 mg/dL — ABNORMAL HIGH (ref 70–99)

## 2024-07-27 LAB — RENAL FUNCTION PANEL
Albumin: 3 g/dL — ABNORMAL LOW (ref 3.5–5.0)
Anion gap: 16 — ABNORMAL HIGH (ref 5–15)
BUN: 54 mg/dL — ABNORMAL HIGH (ref 6–20)
CO2: 28 mmol/L (ref 22–32)
Calcium: 10.4 mg/dL — ABNORMAL HIGH (ref 8.9–10.3)
Chloride: 84 mmol/L — ABNORMAL LOW (ref 98–111)
Creatinine, Ser: 3.99 mg/dL — ABNORMAL HIGH (ref 0.44–1.00)
GFR, Estimated: 14 mL/min — ABNORMAL LOW (ref >60)
Glucose, Bld: 125 mg/dL — ABNORMAL HIGH (ref 70–99)
Phosphorus: 3.7 mg/dL (ref 2.5–4.6)
Potassium: 4.2 mmol/L (ref 3.5–5.1)
Sodium: 128 mmol/L — ABNORMAL LOW (ref 135–145)

## 2024-07-27 MED ORDER — PENTAFLUOROPROP-TETRAFLUOROETH EX AERO: 1.0000 | INHALATION_SPRAY | CUTANEOUS | Status: DC | PRN

## 2024-07-27 MED ORDER — ALTEPLASE 2 MG IJ SOLR: 2.0000 mg | Freq: Once | INTRAMUSCULAR | Status: DC | PRN

## 2024-07-27 MED ORDER — HEPARIN SODIUM (PORCINE) 1000 UNIT/ML DIALYSIS: 1000.0000 [IU] | INTRAMUSCULAR | Status: DC | PRN

## 2024-07-27 MED ORDER — LIDOCAINE-PRILOCAINE 2.5-2.5 % EX CREA: 1.0000 | TOPICAL_CREAM | CUTANEOUS | Status: DC | PRN

## 2024-07-27 MED ORDER — ANTICOAGULANT SODIUM CITRATE 4% (200MG/5ML) IV SOLN: 5.0000 mL | Status: DC | PRN

## 2024-07-27 MED ORDER — LIDOCAINE HCL (PF) 1 % IJ SOLN: 5.0000 mL | INTRAMUSCULAR | Status: DC | PRN

## 2024-07-27 NOTE — Plan of Care (Signed)
  Problem: Education: Goal: Ability to describe self-care measures that may prevent or decrease complications (Diabetes Survival Skills Education) will improve Outcome: Adequate for Discharge   Problem: Coping: Goal: Ability to adjust to condition or change in health will improve Outcome: Adequate for Discharge   Problem: Health Behavior/Discharge Planning: Goal: Ability to identify and utilize available resources and services will improve Outcome: Adequate for Discharge

## 2024-07-27 NOTE — Progress Notes (Signed)
 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  Patient was noted to have hemorrhagic stroke.  Neurology, neurosurgery in critical care were consulted.  EVD was placed on the left side and was initially admitted to neuro ICU.  She was also intubated and had prolonged intubation.  Subsequently, patient was extubated on 05/27/2024.   Important events:  9/15 admitted with ICH plus IVH, EVD was placed for hydrocephalus, intubated 9/22 palliative care meeting.  Family is insistent that they want full scope of care 9/23 Extubated 10/1 -developed fevers pancultures drawn and broad-spectrum antibiotics started 10/2 became afebrile after starting antibiotics, did not tolerate raising EVD 10/6 EVD was raised from 10 to 30 cm water , tolerating well, remained afebrile 10/7 EVD is at 30 cm water , CT scan will be done tomorrow morning, no overnight issues 10/8 she became somnolent, CT head showed increasing hydrocephalus, EVD was titrated down to 10 cm of water  with improvement in mental status 10/15 VP shunt placement 10/17 transferred to progressive unit with [name]  11/18-medically stable.  Awaiting for disposition.  Insurance declined LTAC and skilled nursing facility, appeal pending.    Assessment/Plan:   Acute right parietotemporal intraparenchymal hemorrhage / Intraventricular hemorrhage: Uncontrolled hypertension: Obstructive hydrocephalus s/p EVD changed to VP shunt: Seizure disorder S/p VP shunt placement on 06/18/2024 by Dr. Mavis.  Continue Vimpat .   Neurosurgery intermittently followed the  patient during hospitalization.   Currently stable   ESRD on dialysis: History of failed renal transplant: Secondary hyperparathyroidism: Nephrology on board.  Continue CellCept  and prednisone .   -Calcium  is high, phosphorus low, patient is on Sensipar  On Monday, Wednesday, Friday hemodialysis schedule.   TOC is working on finding outpatient hemodialysis set up  Hyponatremia - Chronic, nephrology following -Patient on hemodialysis    Acute bronchitis with fever:  Resolved   History of pancreatic/kidney transplantation:  Continue cyclosporine , mycophenolate , and prednisone .   Hypertension:  -Blood pressure is stable -Continue Coreg , amlodipine  -Continue clonidine  on hemodialysis days -Started on hydralazine  25 mg p.o. every 8 hours    Anemia of chronic disease:  Latest hemoglobin  8.5.. Transfuse if Hb below 7.0   Type 1 diabetes with vasculopathy/gastroparesis:  Not on long-term insulin  anymore due to pancreatic transplant.  -She is currently n.p.o. for swallow evaluation/MBS Check CBG every 8 hours   Moderate protein calorie malnutrition/dysphagia:  On PEG tube feeding..   Patient swallow has reassessed the patient 07/24/2024 at the request of the mother for oral intake and still recommending NPO.   Stage I sacral pressure ulcer present on admission:  Continue wound care   Debility/deconditioning/disposition/goals of care:  Palliative care had seen the patient during hospitalization.  Family has insisted full code.   Palliative care has signed off 10/17.  Disposition pending.  TOC on board.  Patient has been seen by physical therapy during hospitalization and patient requiring significant help      DVT prophylaxis: SCDs  Medications     carvedilol   25 mg Per Tube BID WC   Chlorhexidine  Gluconate Cloth  6 each Topical Q0600   cinacalcet   30 mg Oral Q M,W,F   cycloSPORINE   200 mg Per Tube QPM  cycloSPORINE   225 mg Per Tube Daily   darbepoetin (ARANESP )  injection - DIALYSIS  200 mcg Subcutaneous Q Thu-1800   famotidine   10 mg Per Tube QHS   feeding supplement (NEPRO CARB STEADY)  1,000 mL Per Tube Q24H   fiber supplement (BANATROL TF)  60 mL Per Tube TID   gabapentin   100 mg Per Tube Daily   Gerhardt's butt cream   Topical BID   hydrALAZINE   25 mg Oral Q8H   lacosamide   100 mg Per Tube BID   liver oil-zinc  oxide   Topical TID   losartan   25 mg Per Tube Q1200   multivitamin with minerals  1 tablet Per Tube Daily   mycophenolate   500 mg Per Tube BID   mouth rinse  15 mL Mouth Rinse 4 times per day   oxidized cellulose  1 each Topical Once   pantoprazole  (PROTONIX ) IV  40 mg Intravenous Daily   predniSONE   15 mg Per Tube Q breakfast     Data Reviewed:   CBG:  Recent Labs  Lab 07/21/24 2042 07/22/24 0002 07/22/24 0413 07/22/24 0731 07/27/24 0857  GLUCAP 171* 161* 99 109* 126*    SpO2: 100 % O2 Flow Rate (L/min): 100 L/min FiO2 (%): 40 %    Vitals:   07/27/24 0328 07/27/24 0500 07/27/24 0733 07/27/24 1121  BP: (!) 152/69  (!) 156/64 (!) 177/75  Pulse: 74  74 80  Resp:   16 16  Temp: 97.8 F (36.6 C)  97.8 F (36.6 C) 97.8 F (36.6 C)  TempSrc: Oral  Oral Oral  SpO2: 100%  98% 100%  Weight:  53.5 kg    Height:          Data Reviewed:  Basic Metabolic Panel: Recent Labs  Lab 07/21/24 0906 07/23/24 0455 07/25/24 0131  NA 126* 127* 127*  K 4.7 5.1 4.5  CL 84* 86* 87*  CO2 26 25 27   GLUCOSE 122* 91 110*  BUN 81* 58* 51*  CREATININE 4.87* 4.07* 3.54*  CALCIUM  10.5* 10.7* 10.5*  MG  --  3.2*  --   PHOS 3.1  --  3.8    CBC: Recent Labs  Lab 07/21/24 0907 07/23/24 0455 07/25/24 0841 07/27/24 1108  WBC 5.4 3.5* 3.5* 3.7*  HGB 7.8* 8.5* 8.5* 8.5*  HCT 23.9* 25.9* 25.9* 26.2*  MCV 96.4 96.6 97.4 97.8  PLT 194 167 181 169    LFT Recent Labs  Lab 07/21/24 0906 07/25/24 0131  ALBUMIN  2.8* 3.1*     Antibiotics: Anti-infectives (From admission, onward)    Start     Dose/Rate Route  Frequency Ordered Stop   07/08/24 1530  fluconazole  (DIFLUCAN ) tablet 150 mg        150 mg Oral  Once 07/08/24 1437 07/08/24 1544   06/18/24 2030  ceFAZolin  (ANCEF ) IVPB 2g/100 mL premix  Status:  Discontinued        2 g 200 mL/hr over 30 Minutes Intravenous Every 8 hours 06/18/24 1454 06/19/24 1608   06/18/24 0600  ceFAZolin  (ANCEF ) IVPB 2g/100 mL premix        2 g 200 mL/hr over 30 Minutes Intravenous On call to O.R. 06/15/24 1152 06/18/24 1230   06/06/24 1400  piperacillin -tazobactam (ZOSYN ) IVPB 2.25 g        2.25 g 100 mL/hr over 30 Minutes Intravenous Every 8 hours 06/06/24 1017 06/09/24 0538   06/06/24 1200  vancomycin  (VANCOREADY) IVPB 500 mg/100 mL  500 mg 100 mL/hr over 60 Minutes Intravenous Every M-W-F (Hemodialysis) 06/04/24 1745 06/06/24 1900   06/04/24 1845  vancomycin  (VANCOCIN ) IVPB 1000 mg/200 mL premix        1,000 mg 200 mL/hr over 60 Minutes Intravenous  Once 06/04/24 1745 06/04/24 1915   06/04/24 1830  ceFEPIme  (MAXIPIME ) 2 g in sodium chloride  0.9 % 100 mL IVPB  Status:  Discontinued        2 g 200 mL/hr over 30 Minutes Intravenous Every M-W-F (Hemodialysis) 06/04/24 1743 06/06/24 1017   05/27/24 1015  cefTRIAXone  (ROCEPHIN ) 2 g in sodium chloride  0.9 % 100 mL IVPB        2 g 200 mL/hr over 30 Minutes Intravenous Every 24 hours 05/27/24 0918 05/29/24 1014   05/23/24 1530  ceFEPIme  (MAXIPIME ) 1 g in sodium chloride  0.9 % 100 mL IVPB  Status:  Discontinued        1 g 200 mL/hr over 30 Minutes Intravenous Every 24 hours 05/23/24 1433 05/27/24 0918   05/22/24 2200  Ampicillin -Sulbactam (UNASYN ) 3 g in sodium chloride  0.9 % 100 mL IVPB  Status:  Discontinued        3 g 200 mL/hr over 30 Minutes Intravenous Every 24 hours 05/22/24 0834 05/23/24 1433   05/22/24 0930  Ampicillin -Sulbactam (UNASYN ) 3 g in sodium chloride  0.9 % 100 mL IVPB        3 g 200 mL/hr over 30 Minutes Intravenous  Once 05/22/24 0834 05/22/24 1900        CONSULTS neurosurgery,  palliative care  Code Status: Full code  Family Communication: Discussed with patient's family member at bedside     Subjective   Denies any complaints.  Wants to take shower.  Awaiting modified barium swallow   Objective    Physical Examination:  Appears in no acute distress S1-S2, regular, no murmur auscultated Lungs clear to auscultation bilaterally Abdomen is soft, nontender          Hanley Rispoli S Carmel Garfield   [name] If 7PM-7AM, please contact night-coverage at [email or web address], Office  [phone]   07/27/2024, 11:31 AM  LOS: 69 days

## 2024-07-27 NOTE — Progress Notes (Signed)
  [facility] Progress Note   Subjective:   Patient seen and examined at bedside.  In good spirits.  Joking around.  Denies SOB, CP, nausea and vomiting.   Objective Vitals:   07/27/24 0031 07/27/24 0328 07/27/24 0500 07/27/24 0733  BP: (!) 146/63 (!) 152/69  (!) 156/64  Pulse: 72 74  74  Resp:    16  Temp: 97.7 F (36.5 C) 97.8 F (36.6 C)  97.8 F (36.6 C)  TempSrc: Oral Oral  Oral  SpO2: 99% 100%  98%  Weight:   53.5 kg   Height:       Physical Exam General:pleasant, alert female in NAD Heart:RRR Lungs:CTAB, nml WOB on RA Abdomen:soft, NTND Extremities:no LE edema  Dialysis Access: LU AVF +b/t   Filed Weights   07/25/24 1456 07/26/24 0500 07/27/24 0500  Weight: 55.2 kg 52.5 kg 53.5 kg    Intake/Output Summary (Last 24 hours) at 07/27/2024 0925 Last data filed at 07/26/2024 2000 Gross per 24 hour  Intake 0 ml  Output --  Net 0 ml    Additional Objective Labs: Basic Metabolic Panel: Recent Labs  Lab 07/21/24 0906 07/23/24 0455 07/25/24 0131  NA 126* 127* 127*  K 4.7 5.1 4.5  CL 84* 86* 87*  CO2 26 25 27   GLUCOSE 122* 91 110*  BUN 81* 58* 51*  CREATININE 4.87* 4.07* 3.54*  CALCIUM  10.5* 10.7* 10.5*  PHOS 3.1  --  3.8   Liver Function Tests: Recent Labs  Lab 07/21/24 0906 07/25/24 0131  ALBUMIN  2.8* 3.1*   CBC: Recent Labs  Lab 07/21/24 0907 07/23/24 0455 07/25/24 0841  WBC 5.4 3.5* 3.5*  HGB 7.8* 8.5* 8.5*  HCT 23.9* 25.9* 25.9*  MCV 96.4 96.6 97.4  PLT 194 167 181   CBG: Recent Labs  Lab 07/21/24 2042 07/22/24 0002 07/22/24 0413 07/22/24 0731 07/27/24 0857  GLUCAP 171* 161* 99 109* 126*   Medications:   carvedilol   25 mg Per Tube BID WC   Chlorhexidine  Gluconate Cloth  6 each Topical Q0600   cinacalcet   30 mg Oral Q M,W,F   cycloSPORINE   200 mg Per Tube QPM   cycloSPORINE   225 mg Per Tube Daily   darbepoetin (ARANESP ) injection - DIALYSIS  200 mcg Subcutaneous Q Thu-1800   famotidine   10 mg Per Tube QHS    feeding supplement (NEPRO CARB STEADY)  1,000 mL Per Tube Q24H   fiber supplement (BANATROL TF)  60 mL Per Tube TID   gabapentin   100 mg Per Tube Daily   Gerhardt's butt cream   Topical BID   hydrALAZINE   25 mg Oral Q8H   lacosamide   100 mg Per Tube BID   liver oil-zinc  oxide   Topical TID   losartan   25 mg Per Tube Q1200   multivitamin with minerals  1 tablet Per Tube Daily   mycophenolate   500 mg Per Tube BID   mouth rinse  15 mL Mouth Rinse 4 times per day   oxidized cellulose  1 each Topical Once   pantoprazole  (PROTONIX ) IV  40 mg Intravenous Daily   predniSONE   15 mg Per Tube Q breakfast    Dialysis Orders: 3:45hr, 400/A1.5, EDW 55.2kg, 2K/2Ca bath, AVF, no heparin  - Prev on Mircera 150mcg IV q 2 weeks - No VDRA   Assessment/Plan: Acute R intracranial hemorrhage: 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 Needs ongoing rehab. Doing better.  ESRD:  HD on MWF.  Due to Thanksgiving Holiday, will have dialysis on  Sunday, Tuesday and Friday this week. HD today.  HTN/volume: BP trending back up- has had some hypotension with HD. UF as tolerated. Off amlodipine , now on Coreg  25mg  bid, Losartan  25 mg, clonidine  down to 0.1mg  daily- taper as needed.  Hydralazine  25mg  TID started by PMD yesterday. Anemia of ESRD: Hgb 8s, transfuse prn. Continue Aranesp  200 q Thurs Secondary HPTH: CorrCa high, phos in goal. Continue sensipar , not on VDRA. Nutrition: Alb low, Remains NPO - to complete MBS to objectively evaluate her swallow. continue Nepro TF.  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 Dispo: Insurance declined LTAC, and SNF, appeal pending. Tolerated HD in recliner on multiple days  Manuelita Labella, PA-C [facility] 07/27/2024,9:25 AM  LOS: 69 days

## 2024-07-27 NOTE — Progress Notes (Signed)
  Received patient in bed to unit.   Informed consent signed and in chart.    TX duration:     Transported by  Hand-off given to patient's nurse.    Access used: RT AVF Access issues: None   Total UF removed: 2000 Medication(s) given: none Post HD VS: 111/51 Post HD weight: 54.3   Rossy Virag Renie  LPN [facility]

## 2024-07-28 ENCOUNTER — Inpatient Hospital Stay (HOSPITAL_COMMUNITY)

## 2024-07-28 DIAGNOSIS — I619 Nontraumatic intracerebral hemorrhage, unspecified: Secondary | ICD-10-CM | POA: Diagnosis not present

## 2024-07-28 DIAGNOSIS — I610 Nontraumatic intracerebral hemorrhage in hemisphere, subcortical: Secondary | ICD-10-CM | POA: Diagnosis not present

## 2024-07-28 DIAGNOSIS — E440 Moderate protein-calorie malnutrition: Secondary | ICD-10-CM | POA: Diagnosis not present

## 2024-07-28 LAB — GLUCOSE, CAPILLARY
Glucose-Capillary: 109 mg/dL — ABNORMAL HIGH (ref 70–99)
Glucose-Capillary: 147 mg/dL — ABNORMAL HIGH (ref 70–99)
Glucose-Capillary: 145 mg/dL — ABNORMAL HIGH (ref 70–99)

## 2024-07-28 MED ORDER — CHLORHEXIDINE GLUCONATE CLOTH 2 % EX PADS
6.0000 | MEDICATED_PAD | Freq: Every day | CUTANEOUS | Status: DC
  Administered 2024-07-28 – 2024-08-01 (×5): 6 via TOPICAL

## 2024-07-28 NOTE — Progress Notes (Signed)
 Modified Barium Swallow Study  Patient Details  Name: [name] [name] MRN: [id number] Date of Birth: [birth year]  Today's Date: 07/28/2024  Modified Barium Swallow completed.  Full report located under Chart Review in the Imaging Section.  History of Present Illness Patient is a 43 y.o. female who is well known to SLP department from prior hospitalizations. 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 PEG placed in July 2025 and converted to GJ tube in August. She has been hospitalized since 05/19/2024 for left gaze and left sided weakness. 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 Intubated 9/15-9/23. EVD 9/15- 10/15; s/p shunt 10/15. SLP has followed patient for dysphagia in the past, with recommendation for thin liquids/full liquids diet back in July of 2025. At that time, advancement of PO's was hindered by patient's lethargy and frequent bouts of emesis. SLP ordered for cognitive evaluation during current admission which was completed on 06/21/24, however patient did not progress due to somnolence. SLP s/o and did indicate recommendation to reorder SLP for swallow evaluation if/when she is able to maintain adequate alertness. SLP swallow evaluation order placed by MD on 07/24/24.   Clinical Impression Pt exhibits mild oral dysphagia without observed penetration/aspiration. The oral phase is disorganized with lingual pumping but ultimate clearance. Laryngeal closure is complete and there is only trace pharyngeal residue with all consistencies tested.   While her swallowing appears overall functional, her mentation and GI issues may affect safety and intake. She was fully alert for this MBS, though notes report inconsistent alertness with  inability to rouse for participation in therapy on multiple occasions. She vomits frequently on TF alone, increasing the risk for inadequate hydration and nutrition as well as post-prandial aspiration (although most recent CXR 11/15 is negative for pneumonia, suggesting that if this is occurring, she is tolerating it to some degree). Discussed with MD, who will monitor level of alertness and TF with goal of resuming regular solids with thin liquids. For now, she can have any desired snack from floor stock with full supervision as long as she is fully alert. Will continue following for ongoing assessment and MD readiness to resume a regular diet.   Factors that may increase risk of adverse event in presence of aspiration Noe & Lianne 2021): Poor general health and/or compromised immunity;Reduced cognitive function;Frail or deconditioned  Swallow Evaluation Recommendations Recommendations: PO diet PO Diet Recommendation: Regular;Thin liquids (Level 0) Liquid Administration via: Cup;Straw Medication Administration: Via alternative means Supervision: Staff to assist with self-feeding;Full supervision/cueing for swallowing strategies Swallowing strategies  : Minimize environmental distractions;Slow rate;Small bites/sips Postural changes: Position pt fully upright for meals;Stay upright 30-60 min after meals Oral care recommendations: Oral care BID (2x/day)    Damien Blumenthal, M.A., CCC-SLP Speech Language Pathology, Acute Rehabilitation Services  Secure Chat preferred [phone]  07/28/2024,3:09 PM

## 2024-07-28 NOTE — Progress Notes (Signed)
 [name]  PROGRESS NOTE  [name] [name] FMW:[id number] DOB: 28-Dec-1980 DOA: 05/19/2024 PCP: [facility]   Brief HPI:    43-yrs-old female with past medical history of hypertension, CVA, type 1 diabetes, gastroparesis, kidney and pancreatic transplant 2014 with subsequent failure of renal transplant now on end-stage renal dialysis, anxiety, narcotic abuse presented to the hospital from dialysis unit on 05/19/2024 as code stroke.  Patient was noted to have hemorrhagic stroke.  Neurology, neurosurgery in critical care were consulted.  EVD was placed on the left side and was initially admitted to neuro ICU.  She was also intubated and had prolonged intubation.  Subsequently, patient was extubated on 05/27/2024.   Important events:  9/15 admitted with ICH plus IVH, EVD was placed for hydrocephalus, intubated 9/22 palliative care meeting.  Family is insistent that they want full scope of care 9/23 Extubated 10/1 -developed fevers pancultures drawn and broad-spectrum antibiotics started 10/2 became afebrile after starting antibiotics, did not tolerate raising EVD 10/6 EVD was raised from 10 to 30 cm water , tolerating well, remained afebrile 10/7 EVD is at 30 cm water , CT scan will be done tomorrow morning, no overnight issues 10/8 she became somnolent, CT head showed increasing hydrocephalus, EVD was titrated down to 10 cm of water  with improvement in mental status 10/15 VP shunt placement 10/17 transferred to progressive unit with [name]  11/18-medically stable.  Awaiting for disposition.  Insurance declined LTAC and skilled nursing facility, appeal pending.    Assessment/Plan:   Acute right parietotemporal intraparenchymal hemorrhage / Intraventricular hemorrhage: Uncontrolled hypertension: Obstructive hydrocephalus s/p EVD changed to VP shunt: Seizure disorder S/p VP shunt placement on 06/18/2024 by Dr. Mavis.  Continue Vimpat .   Neurosurgery intermittently followed the  patient during hospitalization.   Currently stable   ESRD on dialysis: History of failed renal transplant: Secondary hyperparathyroidism: Nephrology on board.  Continue CellCept  and prednisone .   -Calcium  is high, phosphorus low, patient is on Sensipar  On Monday, Wednesday, Friday hemodialysis schedule.   TOC is working on finding outpatient hemodialysis set up  Hyponatremia - Chronic, nephrology following -Patient on hemodialysis    Acute bronchitis with fever:  Resolved   History of pancreatic/kidney transplantation:  Continue cyclosporine , mycophenolate , and prednisone .   Hypertension:  -Blood pressure is stable -Continue Coreg , amlodipine  -Continue clonidine  on hemodialysis days -Started on hydralazine  25 mg p.o. every 8 hours    Anemia of chronic disease:  Latest hemoglobin  8.5.. Transfuse if Hb below 7.0   Type 1 diabetes with vasculopathy/gastroparesis:  Not on long-term insulin  anymore due to pancreatic transplant.  -She is currently n.p.o. for swallow evaluation/MBS Check CBG every 8 hours   Moderate protein calorie malnutrition/dysphagia:  On PEG tube feeding..   Patient swallow has reassessed the patient 07/24/2024 at the request of the mother for oral intake and still recommending NPO. -Plan for MBS today   Stage I sacral pressure ulcer present on admission:  Continue wound care   Debility/deconditioning/disposition/goals of care:  Palliative care had seen the patient during hospitalization.  Family has insisted full code.   Palliative care has signed off 10/17.  Disposition pending.  TOC on board.  Patient has been seen by physical therapy during hospitalization and patient requiring significant help      DVT prophylaxis: SCDs  Medications     carvedilol   25 mg Per Tube BID WC   Chlorhexidine  Gluconate Cloth  6 each Topical Q0600   cinacalcet   30 mg Oral Q M,W,F   cycloSPORINE   200  mg Per Tube QPM   cycloSPORINE   225 mg Per Tube Daily    darbepoetin (ARANESP ) injection - DIALYSIS  200 mcg Subcutaneous Q Thu-1800   famotidine   10 mg Per Tube QHS   feeding supplement (NEPRO CARB STEADY)  1,000 mL Per Tube Q24H   fiber supplement (BANATROL TF)  60 mL Per Tube TID   gabapentin   100 mg Per Tube Daily   Gerhardt's butt cream   Topical BID   hydrALAZINE   25 mg Oral Q8H   lacosamide   100 mg Per Tube BID   liver oil-zinc  oxide   Topical TID   losartan   25 mg Per Tube Q1200   multivitamin with minerals  1 tablet Per Tube Daily   mycophenolate   500 mg Per Tube BID   mouth rinse  15 mL Mouth Rinse 4 times per day   oxidized cellulose  1 each Topical Once   pantoprazole  (PROTONIX ) IV  40 mg Intravenous Daily   predniSONE   15 mg Per Tube Q breakfast     Data Reviewed:   CBG:  Recent Labs  Lab 07/22/24 0413 07/22/24 0731 07/27/24 0857 07/27/24 2349 07/28/24 0725  GLUCAP 99 109* 126* 126* 109*    SpO2: 98 % O2 Flow Rate (L/min): 100 L/min FiO2 (%): 40 %    Vitals:   07/27/24 1637 07/27/24 1711 07/27/24 1715 07/28/24 0500  BP: (!) 113/57 (!) 106/54 (!) 111/51   Pulse: 82 79 81   Resp: 19 12 15    Temp:   (!) 97.4 F (36.3 C)   TempSrc:      SpO2: 99% 99% 98%   Weight:   54.3 kg 52.4 kg  Height:          Data Reviewed:  Basic Metabolic Panel: Recent Labs  Lab 07/21/24 0906 07/23/24 0455 07/25/24 0131 07/27/24 1108  NA 126* 127* 127* 128*  K 4.7 5.1 4.5 4.2  CL 84* 86* 87* 84*  CO2 26 25 27 28   GLUCOSE 122* 91 110* 125*  BUN 81* 58* 51* 54*  CREATININE 4.87* 4.07* 3.54* 3.99*  CALCIUM  10.5* 10.7* 10.5* 10.4*  MG  --  3.2*  --   --   PHOS 3.1  --  3.8 3.7    CBC: Recent Labs  Lab 07/21/24 0907 07/23/24 0455 07/25/24 0841 07/27/24 1108  WBC 5.4 3.5* 3.5* 3.7*  HGB 7.8* 8.5* 8.5* 8.5*  HCT 23.9* 25.9* 25.9* 26.2*  MCV 96.4 96.6 97.4 97.8  PLT 194 167 181 169    LFT Recent Labs  Lab 07/21/24 0906 07/25/24 0131 07/27/24 1108  ALBUMIN  2.8* 3.1* 3.0*      Antibiotics: Anti-infectives (From admission, onward)    Start     Dose/Rate Route Frequency Ordered Stop   07/08/24 1530  fluconazole  (DIFLUCAN ) tablet 150 mg        150 mg Oral  Once 07/08/24 1437 07/08/24 1544   06/18/24 2030  ceFAZolin  (ANCEF ) IVPB 2g/100 mL premix  Status:  Discontinued        2 g 200 mL/hr over 30 Minutes Intravenous Every 8 hours 06/18/24 1454 06/19/24 1608   06/18/24 0600  ceFAZolin  (ANCEF ) IVPB 2g/100 mL premix        2 g 200 mL/hr over 30 Minutes Intravenous On call to O.R. 06/15/24 1152 06/18/24 1230   06/06/24 1400  piperacillin -tazobactam (ZOSYN ) IVPB 2.25 g        2.25 g 100 mL/hr over 30 Minutes Intravenous Every 8 hours 06/06/24 1017  06/09/24 0538   06/06/24 1200  vancomycin  (VANCOREADY) IVPB 500 mg/100 mL        500 mg 100 mL/hr over 60 Minutes Intravenous Every M-W-F (Hemodialysis) 06/04/24 1745 06/06/24 1900   06/04/24 1845  vancomycin  (VANCOCIN ) IVPB 1000 mg/200 mL premix        1,000 mg 200 mL/hr over 60 Minutes Intravenous  Once 06/04/24 1745 06/04/24 1915   06/04/24 1830  ceFEPIme  (MAXIPIME ) 2 g in sodium chloride  0.9 % 100 mL IVPB  Status:  Discontinued        2 g 200 mL/hr over 30 Minutes Intravenous Every M-W-F (Hemodialysis) 06/04/24 1743 06/06/24 1017   05/27/24 1015  cefTRIAXone  (ROCEPHIN ) 2 g in sodium chloride  0.9 % 100 mL IVPB        2 g 200 mL/hr over 30 Minutes Intravenous Every 24 hours 05/27/24 0918 05/29/24 1014   05/23/24 1530  ceFEPIme  (MAXIPIME ) 1 g in sodium chloride  0.9 % 100 mL IVPB  Status:  Discontinued        1 g 200 mL/hr over 30 Minutes Intravenous Every 24 hours 05/23/24 1433 05/27/24 0918   05/22/24 2200  Ampicillin -Sulbactam (UNASYN ) 3 g in sodium chloride  0.9 % 100 mL IVPB  Status:  Discontinued        3 g 200 mL/hr over 30 Minutes Intravenous Every 24 hours 05/22/24 0834 05/23/24 1433   05/22/24 0930  Ampicillin -Sulbactam (UNASYN ) 3 g in sodium chloride  0.9 % 100 mL IVPB        3 g 200 mL/hr over 30 Minutes  Intravenous  Once 05/22/24 0834 05/22/24 1900        CONSULTS neurosurgery, palliative care  Code Status: Full code  Family Communication: Discussed with patient's family member at bedside     Subjective   Patient seen and examined, awaiting modified barium swallow today.   Objective    Physical Examination:   Appears in no acute distress S1-S2, regular Lungs clear to auscultation bilaterally Abdomen is soft, nontender, no organomegaly         [name] [name] S [name] [name]   [name] If 7PM-7AM, please contact night-coverage at [email or web address], Office  [phone]   07/28/2024, 8:20 AM  LOS: 70 days

## 2024-07-28 NOTE — Progress Notes (Signed)
 [facility] Progress Note   Subjective:   Seen in room. Getting ready to go to swallow eval. Denies SOB, CP, dizziness.   Objective Vitals:   07/27/24 1637 07/27/24 1711 07/27/24 1715 07/28/24 0500  BP: (!) 113/57 (!) 106/54 (!) 111/51   Pulse: 82 79 81   Resp: 19 12 15    Temp:   (!) 97.4 F (36.3 C)   TempSrc:      SpO2: 99% 99% 98%   Weight:   54.3 kg 52.4 kg  Height:       Physical Exam General: alert female in NAD Heart: RRR, no murmurs, rubs or gallops Lungs: CTA bilaterally, respirations unlabored Abdomen: Soft, non-distended, +BS Extremities: No edema b/l lower extremities Dialysis Access:  LUE AVF + t/b  Additional Objective Labs: Basic Metabolic Panel: Recent Labs  Lab 07/23/24 0455 07/25/24 0131 07/27/24 1108  NA 127* 127* 128*  K 5.1 4.5 4.2  CL 86* 87* 84*  CO2 25 27 28   GLUCOSE 91 110* 125*  BUN 58* 51* 54*  CREATININE 4.07* 3.54* 3.99*  CALCIUM  10.7* 10.5* 10.4*  PHOS  --  3.8 3.7   Liver Function Tests: Recent Labs  Lab 07/25/24 0131 07/27/24 1108  ALBUMIN  3.1* 3.0*   No results for input(s): LIPASE, AMYLASE in the last 168 hours. CBC: Recent Labs  Lab 07/23/24 0455 07/25/24 0841 07/27/24 1108  WBC 3.5* 3.5* 3.7*  HGB 8.5* 8.5* 8.5*  HCT 25.9* 25.9* 26.2*  MCV 96.6 97.4 97.8  PLT 167 181 169   Blood Culture    Component Value Date/Time   SDES CSF 06/11/2024 0920   SPECREQUEST LP 06/11/2024 0920   CULT  06/11/2024 0920    NO GROWTH 3 DAYS Performed at [facility], [address], [place], [name] [address]    REPTSTATUS 06/14/2024 FINAL 06/11/2024 0920    Cardiac Enzymes: No results for input(s): CKTOTAL, CKMB, CKMBINDEX, TROPONINI in the last 168 hours. CBG: Recent Labs  Lab 07/22/24 0413 07/22/24 0731 07/27/24 0857 07/27/24 2349 07/28/24 0725  GLUCAP 99 109* 126* 126* 109*   Iron  Studies: No results for input(s): IRON , TIBC, TRANSFERRIN, FERRITIN in the last 72  hours. @lablastinr3 @ Studies/Results: No results found. Medications:   carvedilol   25 mg Per Tube BID WC   Chlorhexidine  Gluconate Cloth  6 each Topical Q0600   cinacalcet   30 mg Oral Q M,W,F   cycloSPORINE   200 mg Per Tube QPM   cycloSPORINE   225 mg Per Tube Daily   darbepoetin (ARANESP ) injection - DIALYSIS  200 mcg Subcutaneous Q Thu-1800   famotidine   10 mg Per Tube QHS   feeding supplement (NEPRO CARB STEADY)  1,000 mL Per Tube Q24H   fiber supplement (BANATROL TF)  60 mL Per Tube TID   gabapentin   100 mg Per Tube Daily   Gerhardt's butt cream   Topical BID   hydrALAZINE   25 mg Oral Q8H   lacosamide   100 mg Per Tube BID   liver oil-zinc  oxide   Topical TID   losartan   25 mg Per Tube Q1200   multivitamin with minerals  1 tablet Per Tube Daily   mycophenolate   500 mg Per Tube BID   mouth rinse  15 mL Mouth Rinse 4 times per day   oxidized cellulose  1 each Topical Once   pantoprazole  (PROTONIX ) IV  40 mg Intravenous Daily   predniSONE   15 mg Per Tube Q breakfast    Dialysis Orders: 3:45hr, 400/A1.5, EDW  55.2kg, 2K/2Ca bath, AVF, no heparin  - Prev on Mircera 150mcg IV q 2 weeks - No VDRA  Assessment/Plan: Acute R intracranial hemorrhage: 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 Needs ongoing rehab. Doing better.  ESRD:  HD on MWF.  Due to Thanksgiving Holiday, will have dialysis on Sunday, Tuesday and Friday this week.  HTN/volume: BP trending back up- has had some hypotension with HD. UF as tolerated. Off amlodipine , now on Coreg  25mg  bid, Losartan  25 mg. Has been tapered off clonidine .   Hydralazine  25mg  TID started by PMD- may need to decrease if she has low BP on HD Anemia of ESRD: Hgb 8s, transfuse prn. Continue Aranesp  200 q Thurs Secondary HPTH: CorrCa high, phos in goal. Continue sensipar , not on VDRA. Nutrition: Alb low, Remains NPO - to complete MBS to objectively evaluate her swallow. continue Nepro TF.  Hx pancreas/kidney transplant: Remains on IS for pancreas  function. Dispo: Insurance declined LTAC, and SNF, appeal pending. Tolerated HD in recliner on multiple days    Lucie Collet, PA-C 07/28/2024, 9:41 AM  [facility] Pager: [phone]

## 2024-07-28 NOTE — Progress Notes (Signed)
 PT Cancellation Note  Patient Details Name: [name] MRN: [id number] DOB: [birth year]   Cancelled Treatment:    Reason Eval/Treat Not Completed: (P) Patient declined, no reason specified (Pt reports she is exhausted and declines mobility today. Will continue to follow.)   Darryle George 07/28/2024, 2:25 PM

## 2024-07-29 DIAGNOSIS — E440 Moderate protein-calorie malnutrition: Secondary | ICD-10-CM | POA: Diagnosis not present

## 2024-07-29 DIAGNOSIS — I610 Nontraumatic intracerebral hemorrhage in hemisphere, subcortical: Secondary | ICD-10-CM | POA: Diagnosis not present

## 2024-07-29 DIAGNOSIS — I619 Nontraumatic intracerebral hemorrhage, unspecified: Secondary | ICD-10-CM | POA: Diagnosis not present

## 2024-07-29 LAB — CBC
HCT: 26.2 % — ABNORMAL LOW (ref 36.0–46.0)
Hemoglobin: 8.4 g/dL — ABNORMAL LOW (ref 12.0–15.0)
MCH: 31.5 pg (ref 26.0–34.0)
MCHC: 32.1 g/dL (ref 30.0–36.0)
MCV: 98.1 fL (ref 80.0–100.0)
Platelets: 164 K/uL (ref 150–400)
RBC: 2.67 MIL/uL — ABNORMAL LOW (ref 3.87–5.11)
RDW: 16 % — ABNORMAL HIGH (ref 11.5–15.5)
WBC: 3.7 K/uL — ABNORMAL LOW (ref 4.0–10.5)
nRBC: 0 % (ref 0.0–0.2)

## 2024-07-29 LAB — RENAL FUNCTION PANEL
Albumin: 2.8 g/dL — ABNORMAL LOW (ref 3.5–5.0)
Anion gap: 16 — ABNORMAL HIGH (ref 5–15)
BUN: 50 mg/dL — ABNORMAL HIGH (ref 6–20)
CO2: 24 mmol/L (ref 22–32)
Calcium: 10.3 mg/dL (ref 8.9–10.3)
Chloride: 88 mmol/L — ABNORMAL LOW (ref 98–111)
Creatinine, Ser: 3.59 mg/dL — ABNORMAL HIGH (ref 0.44–1.00)
GFR, Estimated: 16 mL/min — ABNORMAL LOW (ref >60)
Glucose, Bld: 105 mg/dL — ABNORMAL HIGH (ref 70–99)
Phosphorus: 3.7 mg/dL (ref 2.5–4.6)
Potassium: 4.3 mmol/L (ref 3.5–5.1)
Sodium: 128 mmol/L — ABNORMAL LOW (ref 135–145)

## 2024-07-29 LAB — GLUCOSE, CAPILLARY
Glucose-Capillary: 125 mg/dL — ABNORMAL HIGH (ref 70–99)
Glucose-Capillary: 137 mg/dL — ABNORMAL HIGH (ref 70–99)
Glucose-Capillary: 162 mg/dL — ABNORMAL HIGH (ref 70–99)
Glucose-Capillary: 178 mg/dL — ABNORMAL HIGH (ref 70–99)

## 2024-07-29 NOTE — Plan of Care (Signed)
 Problem: Education: Goal: Ability to describe self-care measures that may prevent or decrease complications (Diabetes Survival Skills Education) will improve 07/29/2024 0417 by Cleotilde Deanie HERO, RN Outcome: Progressing 07/29/2024 0417 by Cleotilde Deanie HERO, RN Outcome: Progressing Goal: Individualized Educational Video(s) 07/29/2024 0417 by Cleotilde Deanie HERO, RN Outcome: Progressing 07/29/2024 0417 by Cleotilde Deanie HERO, RN Outcome: Progressing   Problem: Coping: Goal: Ability to adjust to condition or change in health will improve 07/29/2024 0417 by Cleotilde Deanie HERO, RN Outcome: Progressing 07/29/2024 0417 by Cleotilde Deanie HERO, RN Outcome: Progressing   Problem: Fluid Volume: Goal: Ability to maintain a balanced intake and output will improve 07/29/2024 0417 by Cleotilde Deanie HERO, RN Outcome: Progressing 07/29/2024 0417 by Cleotilde Deanie HERO, RN Outcome: Progressing   Problem: Health Behavior/Discharge Planning: Goal: Ability to identify and utilize available resources and services will improve 07/29/2024 0417 by Cleotilde Deanie HERO, RN Outcome: Progressing 07/29/2024 0417 by Cleotilde Deanie HERO, RN Outcome: Progressing Goal: Ability to manage health-related needs will improve 07/29/2024 0417 by Cleotilde Deanie HERO, RN Outcome: Progressing 07/29/2024 0417 by Cleotilde Deanie HERO, RN Outcome: Progressing   Problem: Metabolic: Goal: Ability to maintain appropriate glucose levels will improve 07/29/2024 0417 by Cleotilde Deanie HERO, RN Outcome: Progressing 07/29/2024 0417 by Cleotilde Deanie HERO, RN Outcome: Progressing   Problem: Nutritional: Goal: Maintenance of adequate nutrition will improve 07/29/2024 0417 by Cleotilde Deanie HERO, RN Outcome: Progressing 07/29/2024 0417 by Cleotilde Deanie HERO, RN Outcome: Progressing Goal: Progress toward achieving an optimal weight will improve 07/29/2024 0417 by Cleotilde Deanie HERO, RN Outcome: Progressing 07/29/2024 0417 by Cleotilde Deanie HERO, RN Outcome:  Progressing   Problem: Skin Integrity: Goal: Risk for impaired skin integrity will decrease 07/29/2024 0417 by Cleotilde Deanie HERO, RN Outcome: Progressing 07/29/2024 0417 by Cleotilde Deanie HERO, RN Outcome: Progressing   Problem: Tissue Perfusion: Goal: Adequacy of tissue perfusion will improve 07/29/2024 0417 by Cleotilde Deanie HERO, RN Outcome: Progressing 07/29/2024 0417 by Cleotilde Deanie HERO, RN Outcome: Progressing   Problem: Education: Goal: Knowledge of disease or condition will improve 07/29/2024 0417 by Cleotilde Deanie HERO, RN Outcome: Progressing 07/29/2024 0417 by Cleotilde Deanie HERO, RN Outcome: Progressing Goal: Knowledge of secondary prevention will improve (MUST DOCUMENT ALL) 07/29/2024 0417 by Cleotilde Deanie HERO, RN Outcome: Progressing 07/29/2024 0417 by Cleotilde Deanie HERO, RN Outcome: Progressing Goal: Knowledge of patient specific risk factors will improve (DELETE if not current risk factor) 07/29/2024 0417 by Cleotilde Deanie HERO, RN Outcome: Progressing 07/29/2024 0417 by Cleotilde Deanie HERO, RN Outcome: Progressing   Problem: Intracerebral Hemorrhage Tissue Perfusion: Goal: Complications of Intracerebral Hemorrhage will be minimized 07/29/2024 0417 by Cleotilde Deanie HERO, RN Outcome: Progressing 07/29/2024 0417 by Cleotilde Deanie HERO, RN Outcome: Progressing   Problem: Coping: Goal: Will verbalize positive feelings about self 07/29/2024 0417 by Cleotilde Deanie HERO, RN Outcome: Progressing 07/29/2024 0417 by Cleotilde Deanie HERO, RN Outcome: Progressing Goal: Will identify appropriate support needs 07/29/2024 0417 by Cleotilde Deanie HERO, RN Outcome: Progressing 07/29/2024 0417 by Cleotilde Deanie HERO, RN Outcome: Progressing   Problem: Health Behavior/Discharge Planning: Goal: Ability to manage health-related needs will improve 07/29/2024 0417 by Cleotilde Deanie HERO, RN Outcome: Progressing 07/29/2024 0417 by Cleotilde Deanie HERO, RN Outcome: Progressing Goal: Goals will be collaboratively  established with patient/family 07/29/2024 0417 by Cleotilde Deanie HERO, RN Outcome: Progressing 07/29/2024 0417 by Cleotilde Deanie HERO, RN Outcome: Progressing   Problem: Self-Care: Goal: Ability to participate in self-care as condition permits will improve 07/29/2024 0417 by Cleotilde Deanie HERO, RN Outcome: Progressing 07/29/2024  [id number] by Cleotilde Deanie HERO, RN Outcome: Progressing Goal: Verbalization of feelings and concerns over difficulty with self-care will improve 07/29/2024 0417 by Cleotilde Deanie HERO, RN Outcome: Progressing 07/29/2024 0417 by Cleotilde Deanie HERO, RN Outcome: Progressing Goal: Ability to communicate needs accurately will improve 07/29/2024 0417 by Cleotilde Deanie HERO, RN Outcome: Progressing 07/29/2024 0417 by Cleotilde Deanie HERO, RN Outcome: Progressing   Problem: Nutrition: Goal: Risk of aspiration will decrease 07/29/2024 0417 by Cleotilde Deanie HERO, RN Outcome: Progressing 07/29/2024 0417 by Cleotilde Deanie HERO, RN Outcome: Progressing Goal: Dietary intake will improve 07/29/2024 0417 by Cleotilde Deanie HERO, RN Outcome: Progressing 07/29/2024 0417 by Cleotilde Deanie HERO, RN Outcome: Progressing   Problem: Education: Goal: Knowledge of General Education information will improve Description: Including pain rating scale, medication(s)/side effects and non-pharmacologic comfort measures 07/29/2024 0417 by Cleotilde Deanie HERO, RN Outcome: Progressing 07/29/2024 0417 by Cleotilde Deanie HERO, RN Outcome: Progressing   Problem: Health Behavior/Discharge Planning: Goal: Ability to manage health-related needs will improve 07/29/2024 0417 by Cleotilde Deanie HERO, RN Outcome: Progressing 07/29/2024 0417 by Cleotilde Deanie HERO, RN Outcome: Progressing   Problem: Clinical Measurements: Goal: Ability to maintain clinical measurements within normal limits will improve 07/29/2024 0417 by Cleotilde Deanie HERO, RN Outcome: Progressing 07/29/2024 0417 by Cleotilde Deanie HERO, RN Outcome: Progressing Goal:  Will remain free from infection 07/29/2024 0417 by Cleotilde Deanie HERO, RN Outcome: Progressing 07/29/2024 0417 by Cleotilde Deanie HERO, RN Outcome: Progressing Goal: Diagnostic test results will improve 07/29/2024 0417 by Cleotilde Deanie HERO, RN Outcome: Progressing 07/29/2024 0417 by Cleotilde Deanie HERO, RN Outcome: Progressing Goal: Respiratory complications will improve 07/29/2024 0417 by Cleotilde Deanie HERO, RN Outcome: Progressing 07/29/2024 0417 by Cleotilde Deanie HERO, RN Outcome: Progressing Goal: Cardiovascular complication will be avoided 07/29/2024 0417 by Cleotilde Deanie HERO, RN Outcome: Progressing 07/29/2024 0417 by Cleotilde Deanie HERO, RN Outcome: Progressing   Problem: Activity: Goal: Risk for activity intolerance will decrease 07/29/2024 0417 by Cleotilde Deanie HERO, RN Outcome: Progressing 07/29/2024 0417 by Cleotilde Deanie HERO, RN Outcome: Progressing   Problem: Nutrition: Goal: Adequate nutrition will be maintained 07/29/2024 0417 by Cleotilde Deanie HERO, RN Outcome: Progressing 07/29/2024 0417 by Cleotilde Deanie HERO, RN Outcome: Progressing   Problem: Coping: Goal: Level of anxiety will decrease 07/29/2024 0417 by Cleotilde Deanie HERO, RN Outcome: Progressing 07/29/2024 0417 by Cleotilde Deanie HERO, RN Outcome: Progressing   Problem: Elimination: Goal: Will not experience complications related to bowel motility 07/29/2024 0417 by Cleotilde Deanie HERO, RN Outcome: Progressing 07/29/2024 0417 by Cleotilde Deanie HERO, RN Outcome: Progressing   Problem: Pain Managment: Goal: General experience of comfort will improve and/or be controlled 07/29/2024 0417 by Cleotilde Deanie HERO, RN Outcome: Progressing 07/29/2024 0417 by Cleotilde Deanie HERO, RN Outcome: Progressing   Problem: Safety: Goal: Ability to remain free from injury will improve 07/29/2024 0417 by Cleotilde Deanie HERO, RN Outcome: Progressing 07/29/2024 0417 by Cleotilde Deanie HERO, RN Outcome: Progressing   Problem: Skin Integrity: Goal: Risk for  impaired skin integrity will decrease 07/29/2024 0417 by Cleotilde Deanie HERO, RN Outcome: Progressing 07/29/2024 0417 by Cleotilde Deanie HERO, RN Outcome: Progressing   Problem: Education: Goal: Knowledge of the prescribed therapeutic regimen will improve 07/29/2024 0417 by Cleotilde Deanie HERO, RN Outcome: Progressing 07/29/2024 0417 by Cleotilde Deanie HERO, RN Outcome: Progressing   Problem: Clinical Measurements: Goal: Usual level of consciousness will be regained or maintained. 07/29/2024 0417 by Cleotilde Deanie HERO, RN Outcome: Progressing 07/29/2024 0417 by Cleotilde Deanie HERO, RN Outcome: Progressing Goal: Neurologic status will improve 07/29/2024  [id number] by Cleotilde Deanie HERO, RN Outcome: Progressing 07/29/2024 0417 by Cleotilde Deanie HERO, RN Outcome: Progressing Goal: Ability to maintain intracranial pressure will improve 07/29/2024 0417 by Cleotilde Deanie HERO, RN Outcome: Progressing 07/29/2024 0417 by Cleotilde Deanie HERO, RN Outcome: Progressing   Problem: Skin Integrity: Goal: Demonstration of wound healing without infection will improve 07/29/2024 0417 by Cleotilde Deanie HERO, RN Outcome: Progressing 07/29/2024 0417 by Cleotilde Deanie HERO, RN Outcome: Progressing   Problem: Education: Goal: Knowledge of disease and its progression will improve 07/29/2024 0417 by Cleotilde Deanie HERO, RN Outcome: Progressing 07/29/2024 0417 by Cleotilde Deanie HERO, RN Outcome: Progressing Goal: Individualized Educational Video(s) 07/29/2024 0417 by Cleotilde Deanie HERO, RN Outcome: Progressing 07/29/2024 0417 by Cleotilde Deanie HERO, RN Outcome: Progressing   Problem: Fluid Volume: Goal: Compliance with measures to maintain balanced fluid volume will improve 07/29/2024 0417 by Cleotilde Deanie HERO, RN Outcome: Progressing 07/29/2024 0417 by Cleotilde Deanie HERO, RN Outcome: Progressing

## 2024-07-29 NOTE — Progress Notes (Signed)
   07/29/24 1148  Vitals  Temp 97.9 F (36.6 C)  Pulse Rate 82  Resp 12  BP (!) 128/57  SpO2 100 %  O2 Device Room Air  Weight 55.3 kg  Type of Weight Post-Dialysis  Oxygen  Therapy  Patient Activity (if Appropriate) In bed  Pulse Oximetry Type Continuous  Oximetry Probe Site Changed No  Post Treatment  Dialyzer Clearance Lightly streaked  Hemodialysis Intake (mL) 0 mL  Liters Processed 84  Fluid Removed (mL) 2000 mL  Tolerated HD Treatment Yes  AVG/AVF Arterial Site Held (minutes) 5 minutes  AVG/AVF Venous Site Held (minutes) 5 minutes

## 2024-07-29 NOTE — Progress Notes (Signed)
 Pt. Came in on bed, alert and oriented. Consent signed and on file.  Pt. Started with no complaints  UF removed: Tx duration: 3.5 hours  Access used: Right AVF Access issue: None  Tx completed and tolerated. Catheter dwelled and locked with heparin . Endorsed to floor nurse. Transported to room.  Adiana Smelcer Rubi B. Gricel Copen, RN Kidney Dialysis Unit

## 2024-07-29 NOTE — Progress Notes (Signed)
 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  Patient was noted to have hemorrhagic stroke.  Neurology, neurosurgery in critical care were consulted.  EVD was placed on the left side and was initially admitted to neuro ICU.  She was also intubated and had prolonged intubation.  Subsequently, patient was extubated on 05/27/2024.   Important events:  9/15 admitted with ICH plus IVH, EVD was placed for hydrocephalus, intubated 9/22 palliative care meeting.  Family is insistent that they want full scope of care 9/23 Extubated 10/1 -developed fevers pancultures drawn and broad-spectrum antibiotics started 10/2 became afebrile after starting antibiotics, did not tolerate raising EVD 10/6 EVD was raised from 10 to 30 cm water , tolerating well, remained afebrile 10/7 EVD is at 30 cm water , CT scan will be done tomorrow morning, no overnight issues 10/8 she became somnolent, CT head showed increasing hydrocephalus, EVD was titrated down to 10 cm of water  with improvement in mental status 10/15 VP shunt placement 10/17 transferred to progressive unit with [name]  11/18-medically stable.  Awaiting for disposition.  Insurance declined LTAC and skilled nursing facility, appeal pending.    Assessment/Plan:   Acute right parietotemporal intraparenchymal hemorrhage / Intraventricular hemorrhage: Uncontrolled hypertension: Obstructive hydrocephalus s/p EVD changed to VP shunt: Seizure disorder S/p VP shunt placement on 06/18/2024 by Dr. Mavis.  Continue Vimpat .   Neurosurgery intermittently followed the  patient during hospitalization.   Currently stable   ESRD on dialysis: History of failed renal transplant: Secondary hyperparathyroidism: Nephrology on board.  Continue CellCept  and prednisone .   -Calcium  is high, phosphorus low, patient is on Sensipar  On Monday, Wednesday, Friday hemodialysis schedule.   TOC is working on finding outpatient hemodialysis set up  Hyponatremia - Chronic, nephrology following -Patient on hemodialysis    Acute bronchitis with fever:  Resolved   History of pancreatic/kidney transplantation:  Continue cyclosporine , mycophenolate , and prednisone .   Hypertension:  -Blood pressure is stable -Continue Coreg , amlodipine  -Continue clonidine  on hemodialysis days -Started on hydralazine  25 mg p.o. every 8 hours    Anemia of chronic disease:  Latest hemoglobin  8.5.. Transfuse if Hb below 7.0   Type 1 diabetes with vasculopathy/gastroparesis:  Not on long-term insulin  anymore due to pancreatic transplant.  -She is currently n.p.o. for swallow evaluation/MBS Check CBG every 8 hours   Moderate protein calorie malnutrition/dysphagia:  On PEG tube feeding..   Patient swallow has reassessed the patient 07/24/2024 at the request of the mother for oral intake and still recommending - -speech therapy did evaluation, recommend regular diet -Will start regular diet, consult dietitian as patient is also on tube feeds    Debility/deconditioning/disposition/goals of care:  Palliative care had seen the patient during hospitalization.  Family has insisted full code.   Palliative care has signed off 10/17.  Disposition pending.  TOC on board.  Patient has been seen by physical therapy during hospitalization and patient requiring significant help      DVT prophylaxis: SCDs  Medications     carvedilol   25 mg Per Tube BID WC   Chlorhexidine  Gluconate Cloth  6 each Topical Q0600   Chlorhexidine  Gluconate Cloth  6 each Topical Q0600  cinacalcet   30 mg Oral Q  M,W,F   cycloSPORINE   200 mg Per Tube QPM   cycloSPORINE   225 mg Per Tube Daily   darbepoetin (ARANESP ) injection - DIALYSIS  200 mcg Subcutaneous Q Thu-1800   famotidine   10 mg Per Tube QHS   feeding supplement (NEPRO CARB STEADY)  1,000 mL Per Tube Q24H   fiber supplement (BANATROL TF)  60 mL Per Tube TID   gabapentin   100 mg Per Tube Daily   Gerhardt's butt cream   Topical BID   hydrALAZINE   25 mg Oral Q8H   lacosamide   100 mg Per Tube BID   liver oil-zinc  oxide   Topical TID   losartan   25 mg Per Tube Q1200   multivitamin with minerals  1 tablet Per Tube Daily   mycophenolate   500 mg Per Tube BID   mouth rinse  15 mL Mouth Rinse 4 times per day   oxidized cellulose  1 each Topical Once   pantoprazole  (PROTONIX ) IV  40 mg Intravenous Daily   predniSONE   15 mg Per Tube Q breakfast     Data Reviewed:   CBG:  Recent Labs  Lab 07/28/24 0725 07/28/24 1248 07/28/24 1605 07/29/24 0047 07/29/24 0718  GLUCAP 109* 147* 145* 137* 125*    SpO2: 100 % O2 Flow Rate (L/min): 100 L/min FiO2 (%): 40 %    Vitals:   07/29/24 1130 07/29/24 1144 07/29/24 1148 07/29/24 1253  BP: (!) 124/56 116/72 (!) 128/57 (!) 143/68  Pulse: 83 82 82 82  Resp: 20 16 12 16   Temp:   97.9 F (36.6 C) 98 F (36.7 C)  TempSrc:    Oral  SpO2: 100% 100% 100% 100%  Weight:   55.3 kg   Height:          Data Reviewed:  Basic Metabolic Panel: Recent Labs  Lab 07/23/24 0455 07/25/24 0131 07/27/24 1108 07/29/24 0730  NA 127* 127* 128* 128*  K 5.1 4.5 4.2 4.3  CL 86* 87* 84* 88*  CO2 25 27 28 24   GLUCOSE 91 110* 125* 105*  BUN 58* 51* 54* 50*  CREATININE 4.07* 3.54* 3.99* 3.59*  CALCIUM  10.7* 10.5* 10.4* 10.3  MG 3.2*  --   --   --   PHOS  --  3.8 3.7 3.7    CBC: Recent Labs  Lab 07/23/24 0455 07/25/24 0841 07/27/24 1108 07/29/24 0730  WBC 3.5* 3.5* 3.7* 3.7*  HGB 8.5* 8.5* 8.5* 8.4*  HCT 25.9* 25.9* 26.2* 26.2*  MCV 96.6 97.4 97.8 98.1  PLT 167 181 169 164    LFT Recent  Labs  Lab 07/25/24 0131 07/27/24 1108 07/29/24 0730  ALBUMIN  3.1* 3.0* 2.8*     Antibiotics: Anti-infectives (From admission, onward)    Start     Dose/Rate Route Frequency Ordered Stop   07/08/24 1530  fluconazole  (DIFLUCAN ) tablet 150 mg        150 mg Oral  Once 07/08/24 1437 07/08/24 1544   06/18/24 2030  ceFAZolin  (ANCEF ) IVPB 2g/100 mL premix  Status:  Discontinued        2 g 200 mL/hr over 30 Minutes Intravenous Every 8 hours 06/18/24 1454 06/19/24 1608   06/18/24 0600  ceFAZolin  (ANCEF ) IVPB 2g/100 mL premix        2 g 200 mL/hr over 30 Minutes Intravenous On call to O.R. 06/15/24 1152 06/18/24 1230   06/06/24 1400  piperacillin -tazobactam (ZOSYN ) IVPB 2.25 g  2.25 g 100 mL/hr over 30 Minutes Intravenous Every 8 hours 06/06/24 1017 06/09/24 0538   06/06/24 1200  vancomycin  (VANCOREADY) IVPB 500 mg/100 mL        500 mg 100 mL/hr over 60 Minutes Intravenous Every M-W-F (Hemodialysis) 06/04/24 1745 06/06/24 1900   06/04/24 1845  vancomycin  (VANCOCIN ) IVPB 1000 mg/200 mL premix        1,000 mg 200 mL/hr over 60 Minutes Intravenous  Once 06/04/24 1745 06/04/24 1915   06/04/24 1830  ceFEPIme  (MAXIPIME ) 2 g in sodium chloride  0.9 % 100 mL IVPB  Status:  Discontinued        2 g 200 mL/hr over 30 Minutes Intravenous Every M-W-F (Hemodialysis) 06/04/24 1743 06/06/24 1017   05/27/24 1015  cefTRIAXone  (ROCEPHIN ) 2 g in sodium chloride  0.9 % 100 mL IVPB        2 g 200 mL/hr over 30 Minutes Intravenous Every 24 hours 05/27/24 0918 05/29/24 1014   05/23/24 1530  ceFEPIme  (MAXIPIME ) 1 g in sodium chloride  0.9 % 100 mL IVPB  Status:  Discontinued        1 g 200 mL/hr over 30 Minutes Intravenous Every 24 hours 05/23/24 1433 05/27/24 0918   05/22/24 2200  Ampicillin -Sulbactam (UNASYN ) 3 g in sodium chloride  0.9 % 100 mL IVPB  Status:  Discontinued        3 g 200 mL/hr over 30 Minutes Intravenous Every 24 hours 05/22/24 0834 05/23/24 1433   05/22/24 0930  Ampicillin -Sulbactam  (UNASYN ) 3 g in sodium chloride  0.9 % 100 mL IVPB        3 g 200 mL/hr over 30 Minutes Intravenous  Once 05/22/24 0834 05/22/24 1900        CONSULTS neurosurgery, palliative care  Code Status: Full code  Family Communication: Discussed with patient's family member at bedside     Subjective   Patient seen and examined, no new complaints.   Objective    Physical Examination:  Appears in no acute distress S1-S2, regular, no murmur auscultated Lungs are clear to auscultation bilaterally Abdomen is soft, nontender         Draeden Kellman S Monigue Spraggins   [name] If 7PM-7AM, please contact night-coverage at [email or web address], Office  [phone]   07/29/2024, 3:07 PM  LOS: 71 days

## 2024-07-29 NOTE — Progress Notes (Signed)
 Brief Nutrition Support Note  New c/s received for assessment enteral TF rate as pt cleared for PO intake. Reviewed MBS result and SLP note from 11/24. SLP noted that swallow is functional when alert but that pt's mentation is very inconsistent. Currently pt is cleared for snacks from floor stock, but no formal diet as been entered. Pt will not receive a meal tray unless a diet is entered and snacks from floor stock will not be sufficient to meet nutrition needs at this time. RN noted two refusals of PO offerings 10/24.  No adjustments to tube feeds warranted at this time until a formal diet order can be placed to dining services. At that time, pt could be assessed for appropriateness to transition to nocturnal or intermittent feeds pending PO intake.    INTERVENTION:  Continue tube feeding via G-J tube: Nepro at 45 ml/h (1080 ml per day) Provides 1912 kcal, 87 gm protein, 785 ml free water  daily Nutrisource fiber TID  MVI with minerals daily - was liquid but changed by pharmacy team Monitor diet order for advancement and for pt to begin to receive meal trays   Vernell Lukes, RD, LDN, CNSC Registered Dietitian II Please reach out via secure chat

## 2024-07-29 NOTE — TOC Progression Note (Signed)
 Transition of Care [facility]) - Progression Note    Patient Details  Name: [name] [name] MRN: [id number] Date of Birth: 10-23-1980  Transition of Care [facility]) CM/SW Contact  [name], [name] Phone Number: 07/29/2024, 2:53 PM  Clinical Narrative:   CSW spoke with Admissions with [facility], they are unsure when they would have a bed available for patient; it would be after the holiday. CSW asked about [facility], they also have no beds available. CSW contacted [facility] to ask about ability to take patient, they are still on hold for admissions. Patient without other bed offers, will fax out again. CSW to follow.    Expected Discharge Plan: Skilled Nursing Facility Barriers to Discharge: No SNF bed, Insurance Authorization               Expected Discharge Plan and Services In-house Referral: Clinical Social Work   Post Acute Care Choice: Skilled Nursing Facility Living arrangements for the past 2 months: Apartment                                       Social Drivers of Health (SDOH) Interventions SDOH Screenings   Food Insecurity: Patient Unable To Answer (05/20/2024)  Housing: Unknown (07/14/2024)  Transportation Needs: Patient Unable To Answer (05/20/2024)  Utilities: Patient Unable To Answer (05/20/2024)  Social Connections: Unknown (10/04/2022)   Received from [facility]  Stress: No Stress Concern Present (04/16/2024)   Received from [facility]  Tobacco Use: Medium Risk (06/18/2024)    Readmission Risk Interventions    05/20/2024    4:16 PM 03/31/2024   11:27 AM 11/17/2021   12:15 PM  Readmission Risk Prevention Plan  Transportation Screening Complete Complete Complete  Medication Review Oceanographer) Complete Complete Complete  PCP or Specialist appointment within 3-5 days of discharge Complete  Complete  HRI or Home Care Consult Complete Complete Complete  SW Recovery Care/Counseling Consult Complete Complete Complete   Palliative Care Screening Complete Not Applicable Not Applicable  Skilled Nursing Facility Complete Not Applicable Patient Refused

## 2024-07-29 NOTE — TOC Progression Note (Signed)
 Transition of Care [facility]) - Progression Note    Patient Details  Name: [name] [name] MRN: [id number] Date of Birth: [birth year]  Transition [facility]) CM/SW Contact  [name], [name] Phone Number: 07/29/2024, 2:51 PM  Clinical Narrative:   CSW contacted [employer] line to ask about status of appeal, as the portal still has appeal as pending. CSW spoke with representative, lengthy wait on hold, but informed that the appeal was overturned on 11/14. CSW was never contacted, representative said a letter was sent out but CSW never provided a mailing address, asked to be contacted by phone number. Representative confirmed that CSW phone number was on file, but could not answer why a phone call was not placed to CSW to inform about appeal status. Representative provided CSW with contact number for the portal to update the information on appeal status. Patient would need a new authorization at this time.   CSW contacted [facility] to provide update on authorization being approved, but now they do not have any beds available. CSW to follow.    Expected Discharge Plan: Skilled Nursing Facility Barriers to Discharge: No SNF bed, Insurance Authorization               Expected Discharge Plan and Services In-house Referral: Clinical Social Work   Post Acute Care Choice: Skilled Nursing Facility Living arrangements for the past 2 months: Apartment                                       Social Drivers of Health (SDOH) Interventions SDOH Screenings   Food Insecurity: Patient Unable To Answer (05/20/2024)  Housing: Unknown (07/14/2024)  Transportation Needs: Patient Unable To Answer (05/20/2024)  Utilities: Patient Unable To Answer (05/20/2024)  Social Connections: Unknown (10/04/2022)   Received from [facility]  Stress: No Stress Concern Present (04/16/2024)   Received from [facility]  Tobacco Use: Medium Risk (06/18/2024)    Readmission  Risk Interventions    05/20/2024    4:16 PM 03/31/2024   11:27 AM 11/17/2021   12:15 PM  Readmission Risk Prevention Plan  Transportation Screening Complete Complete Complete  Medication Review Oceanographer) Complete Complete Complete  PCP or Specialist appointment within 3-5 days of discharge Complete  Complete  HRI or Home Care Consult Complete Complete Complete  SW Recovery Care/Counseling Consult Complete Complete Complete  Palliative Care Screening Complete Not Applicable Not Applicable  Skilled Nursing Facility Complete Not Applicable Patient Refused

## 2024-07-29 NOTE — Procedures (Signed)
 I was present at this dialysis session. I have reviewed the session itself and made appropriate changes.   Vital signs in last 24 hours:  Temp:  [97.6 F (36.4 C)-98.6 F (37 C)] 97.9 F (36.6 C) (11/25 0756) Pulse Rate:  [70-83] 73 (11/25 0802) Resp:  [12-20] 15 (11/25 0802) BP: (131-168)/(55-73) 155/73 (11/25 0802) SpO2:  [97 %-100 %] 100 % (11/25 0802) Weight:  [56.3 kg] 56.3 kg (11/25 0500) Weight change: 2 kg Filed Weights   07/27/24 1715 07/28/24 0500 07/29/24 0500  Weight: 54.3 kg 52.4 kg 56.3 kg    Recent Labs  Lab 07/29/24 0730  NA 128*  K 4.3  CL 88*  CO2 24  GLUCOSE 105*  BUN 50*  CREATININE 3.59*  CALCIUM  10.3  PHOS 3.7    Recent Labs  Lab 07/25/24 0841 07/27/24 1108 07/29/24 0730  WBC 3.5* 3.7* 3.7*  HGB 8.5* 8.5* 8.4*  HCT 25.9* 26.2* 26.2*  MCV 97.4 97.8 98.1  PLT 181 169 164    Scheduled Meds:  carvedilol   25 mg Per Tube BID WC   Chlorhexidine  Gluconate Cloth  6 each Topical Q0600   Chlorhexidine  Gluconate Cloth  6 each Topical Q0600   cinacalcet   30 mg Oral Q M,W,F   cycloSPORINE   200 mg Per Tube QPM   cycloSPORINE   225 mg Per Tube Daily   darbepoetin (ARANESP ) injection - DIALYSIS  200 mcg Subcutaneous Q Thu-1800   famotidine   10 mg Per Tube QHS   feeding supplement (NEPRO CARB STEADY)  1,000 mL Per Tube Q24H   fiber supplement (BANATROL TF)  60 mL Per Tube TID   gabapentin   100 mg Per Tube Daily   Gerhardt's butt cream   Topical BID   hydrALAZINE   25 mg Oral Q8H   lacosamide   100 mg Per Tube BID   liver oil-zinc  oxide   Topical TID   losartan   25 mg Per Tube Q1200   multivitamin with minerals  1 tablet Per Tube Daily   mycophenolate   500 mg Per Tube BID   mouth rinse  15 mL Mouth Rinse 4 times per day   oxidized cellulose  1 each Topical Once   pantoprazole  (PROTONIX ) IV  40 mg Intravenous Daily   predniSONE   15 mg Per Tube Q breakfast   Continuous Infusions: PRN Meds:.acetaminophen  **OR** acetaminophen  (TYLENOL ) oral liquid 160  mg/5 mL **OR** acetaminophen , camphor-menthol , diphenhydrAMINE , guaiFENesin -dextromethorphan , hydrALAZINE , labetalol , loperamide  HCl, melatonin, ondansetron  (ZOFRAN ) IV, ondansetron , mouth rinse, oxyCODONE , phenol, sennosides   Fairy DELENA Sellar,  MD 07/29/2024, 8:36 AM

## 2024-07-29 NOTE — Plan of Care (Signed)
 Problem: Education: Goal: Ability to describe self-care measures that may prevent or decrease complications (Diabetes Survival Skills Education) will improve Outcome: Progressing Goal: Individualized Educational Video(s) Outcome: Progressing   Problem: Coping: Goal: Ability to adjust to condition or change in health will improve Outcome: Progressing   Problem: Fluid Volume: Goal: Ability to maintain a balanced intake and output will improve Outcome: Progressing   Problem: Health Behavior/Discharge Planning: Goal: Ability to identify and utilize available resources and services will improve Outcome: Progressing Goal: Ability to manage health-related needs will improve Outcome: Progressing   Problem: Metabolic: Goal: Ability to maintain appropriate glucose levels will improve Outcome: Progressing   Problem: Nutritional: Goal: Maintenance of adequate nutrition will improve Outcome: Progressing Goal: Progress toward achieving an optimal weight will improve Outcome: Progressing   Problem: Skin Integrity: Goal: Risk for impaired skin integrity will decrease Outcome: Progressing   Problem: Tissue Perfusion: Goal: Adequacy of tissue perfusion will improve Outcome: Progressing   Problem: Education: Goal: Knowledge of disease or condition will improve Outcome: Progressing Goal: Knowledge of secondary prevention will improve (MUST DOCUMENT ALL) Outcome: Progressing Goal: Knowledge of patient specific risk factors will improve (DELETE if not current risk factor) Outcome: Progressing   Problem: Intracerebral Hemorrhage Tissue Perfusion: Goal: Complications of Intracerebral Hemorrhage will be minimized Outcome: Progressing   Problem: Coping: Goal: Will verbalize positive feelings about self Outcome: Progressing Goal: Will identify appropriate support needs Outcome: Progressing   Problem: Health Behavior/Discharge Planning: Goal: Ability to manage health-related needs will  improve Outcome: Progressing Goal: Goals will be collaboratively established with patient/family Outcome: Progressing   Problem: Self-Care: Goal: Ability to participate in self-care as condition permits will improve Outcome: Progressing Goal: Verbalization of feelings and concerns over difficulty with self-care will improve Outcome: Progressing Goal: Ability to communicate needs accurately will improve Outcome: Progressing   Problem: Nutrition: Goal: Risk of aspiration will decrease Outcome: Progressing Goal: Dietary intake will improve Outcome: Progressing   Problem: Education: Goal: Knowledge of General Education information will improve Description: Including pain rating scale, medication(s)/side effects and non-pharmacologic comfort measures Outcome: Progressing   Problem: Health Behavior/Discharge Planning: Goal: Ability to manage health-related needs will improve Outcome: Progressing   Problem: Clinical Measurements: Goal: Ability to maintain clinical measurements within normal limits will improve Outcome: Progressing Goal: Will remain free from infection Outcome: Progressing Goal: Diagnostic test results will improve Outcome: Progressing Goal: Respiratory complications will improve Outcome: Progressing Goal: Cardiovascular complication will be avoided Outcome: Progressing   Problem: Activity: Goal: Risk for activity intolerance will decrease Outcome: Progressing   Problem: Nutrition: Goal: Adequate nutrition will be maintained Outcome: Progressing   Problem: Coping: Goal: Level of anxiety will decrease Outcome: Progressing   Problem: Elimination: Goal: Will not experience complications related to bowel motility Outcome: Progressing   Problem: Pain Managment: Goal: General experience of comfort will improve and/or be controlled Outcome: Progressing   Problem: Safety: Goal: Ability to remain free from injury will improve Outcome: Progressing    Problem: Skin Integrity: Goal: Risk for impaired skin integrity will decrease Outcome: Progressing   Problem: Education: Goal: Knowledge of the prescribed therapeutic regimen will improve Outcome: Progressing   Problem: Clinical Measurements: Goal: Usual level of consciousness will be regained or maintained. Outcome: Progressing Goal: Neurologic status will improve Outcome: Progressing Goal: Ability to maintain intracranial pressure will improve Outcome: Progressing   Problem: Skin Integrity: Goal: Demonstration of wound healing without infection will improve Outcome: Progressing   Problem: Education: Goal: Knowledge of disease and its progression will improve Outcome: Progressing  Goal: Individualized Educational Video(s) Outcome: Progressing   Problem: Fluid Volume: Goal: Compliance with measures to maintain balanced fluid volume will improve Outcome: Progressing

## 2024-07-29 NOTE — Progress Notes (Signed)
 Occupational Therapy Treatment Patient Details Name: [name] [name] MRN: [id number] DOB: 1981-04-10 Today's Date: 07/29/2024   History of present illness 43 yo female presents 05/19/24 for left gaze and [name] [name] side weakness. 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 Intubated 9/15-9/23. EVD 9/15- 10/15; s/p shunt 10/15. S/p laparoscopy with intra-abdominal assistance ventricular-peritoneal shunt placement 10/15. PMH: HTN, ESRD on HD s/p renal and pancreatic transplants, DM, gastroparesis, multiple strokes (ischemic and hemorrhagic) with prepontine SAH, hydrocephalus requiring EVD placement   OT comments  Pt received in bed, agreeable for OT visit despite fatigue from HD session earlier today. Pt participated in seated LUE therapeutic exercises as detailed below using level 1 theraband with good tolerance. VC for proper form and controlled movements. Discussed good sleep hygiene/sleep preparation routines with pt & mother (limiting screen time ~30-60 minutes before bed, unwinding by reading, listening to relaxing music, using sleep aides, etc.). Attempt to regulate pt's sleep overnight so she can be more participatory during the day. OT to continue to follow.      If plan is discharge home, recommend the following:  Assistance with cooking/housework;Assist for transportation;Help with stairs or ramp for entrance;A little help with walking and/or transfers;Direct supervision/assist for medications management;Direct supervision/assist for financial management;Supervision due to cognitive status;A little help with bathing/dressing/bathroom   Equipment Recommendations  Other (comment) (defer)    Recommendations for Other Services      Precautions / Restrictions Precautions Precautions: Fall Recall of Precautions/Restrictions: Impaired Precaution/Restrictions Comments:  PEG Restrictions Weight Bearing Restrictions Per Provider Order: No       Mobility Bed Mobility Overal bed mobility: Needs Assistance Bed Mobility: Supine to Sit, Sit to Supine     Supine to sit: Min assist Sit to supine: Contact guard assist, Used rails   General bed mobility comments: assist for trunk elevation, exited to the [name] [name] side    Transfers                         Balance Overall balance assessment: Needs assistance Sitting-balance support: No upper extremity supported, Feet supported Sitting balance-Leahy Scale: Good Sitting balance - Comments: seated EOB, no LOB                                   ADL either performed or assessed with clinical judgement   ADL Overall ADL's : Needs assistance/impaired                     Lower Body Dressing: Set up;Bed level Lower Body Dressing Details (indicate cue type and reason): figure four technique to don B socks with incr time/effort                    Extremity/Trunk Assessment Upper Extremity Assessment Upper Extremity Assessment: Generalized weakness LUE Deficits / Details: generally weak and low muscle tone            Vision       Perception     Praxis     Communication Communication Communication: No apparent difficulties   Cognition Arousal: Alert Behavior During Therapy: Flat affect (but participatory) Cognition: Cognition impaired   Orientation impairments:  (not formally assessed this date) Awareness: Intellectual awareness impaired (improving) Memory impairment (select all impairments): Short-term memory, Working memory Attention impairment (select first level  of impairment): Sustained attention Executive functioning impairment (select all impairments): Initiation, Organization, Sequencing, Problem solving OT - Cognition Comments: incr delayed processing during session, pt endorsing incr fatigue from HD earlier today                 Following  commands: Impaired Following commands impaired: Follows one step commands with increased time, Follows multi-step commands inconsistently      Cueing   Cueing Techniques: Verbal cues, Gestural cues, Visual cues  Exercises Exercises: General Upper Extremity, Other exercises General Exercises - Upper Extremity Shoulder Flexion: AROM, Strengthening, Left, 10 reps, Seated, Theraband Theraband Level (Shoulder Flexion): Level 1 (Yellow) Elbow Flexion: AROM, Strengthening, Left, 10 reps, Seated, Theraband Theraband Level (Elbow Flexion): Level 1 (Yellow) Elbow Extension: AROM, Strengthening, Left, 10 reps, Seated, Theraband Theraband Level (Elbow Extension): Level 1 (Yellow) Other Exercises Other Exercises: trunk rotations x10 - placing [name] [name] hand onto [name] [name] for improving general environmental attention and to facilitate core rotation Other Exercises: discussed healthy sleep hygiene/sleep preparation routines with pt and mom (drinking hot tea before bed, unwinding by limiting screen time 30-60 mins before bed, using sleep aides, etc.)    Shoulder Instructions       General Comments mother present for duration of treatment    Pertinent Vitals/ Pain       Pain Assessment Pain Assessment: No/denies pain Pain Score: 0-No pain  Home Living                                          Prior Functioning/Environment              Frequency  Min 2X/week        Progress Toward Goals  OT Goals(current goals can now be found in the care plan section)  Progress towards OT goals: Progressing toward goals     Plan      Co-evaluation                 AM-PAC OT 6 Clicks Daily Activity     Outcome Measure   Help from another person eating meals?: A Little Help from another person taking care of personal grooming?: A Little Help from another person toileting, which includes using toliet, bedpan, or urinal?: A Lot Help from another person bathing (including washing,  rinsing, drying)?: A Lot Help from another person to put on and taking off regular upper body clothing?: A Little Help from another person to put on and taking off regular lower body clothing?: A Little 6 Click Score: 16    End of Session    OT Visit Diagnosis: Unsteadiness on feet (R26.81);Muscle weakness (generalized) (M62.81);History of falling (Z91.81);Other symptoms and signs involving cognitive function;Cognitive communication deficit (R41.841);Low vision, both eyes (H54.2) Symptoms and signs involving cognitive functions: Nontraumatic SAH   Activity Tolerance Patient tolerated treatment well   Patient Left in bed;with call bell/phone within reach;with bed alarm set;with family/visitor present   Nurse Communication          Time: 8462-8396 OT Time Calculation (min): 26 min  Charges: OT General Charges $OT Visit: 1 Visit OT Treatments $Therapeutic Activity: 8-22 mins $Therapeutic Exercise: 8-22 mins  Arnesia Vincelette M. [place], OTR/[name] [name] [facility] Acute Rehabilitation Services [phone] Secure Chat Preferred  Meranda Dechaine 07/29/2024, 4:59 PM

## 2024-07-29 NOTE — Progress Notes (Signed)
 PT Cancellation Note  Patient Details Name: [name] [name] MRN: [id number] DOB: 02/26/1981   Cancelled Treatment:    Reason Eval/Treat Not Completed: Patient at procedure or test/unavailable (HD). Will follow up for PT treatment as schedule permits.  Jhordan Mckibben, PT, DPT Acute Rehabilitation Services  Personal: Secure Chat Rehab Office: [phone]  Taison Celani L Doneisha Ivey 07/29/2024, 7:54 AM

## 2024-07-30 DIAGNOSIS — I619 Nontraumatic intracerebral hemorrhage, unspecified: Secondary | ICD-10-CM | POA: Diagnosis not present

## 2024-07-30 LAB — GLUCOSE, CAPILLARY
Glucose-Capillary: 88 mg/dL (ref 70–99)
Glucose-Capillary: 183 mg/dL — ABNORMAL HIGH (ref 70–99)
Glucose-Capillary: 142 mg/dL — ABNORMAL HIGH (ref 70–99)

## 2024-07-30 NOTE — Progress Notes (Signed)
 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  Patient was noted to have hemorrhagic stroke.  Neurology, neurosurgery in critical care were consulted.  EVD was placed on the left side and was initially admitted to neuro ICU.  She was also intubated and had prolonged intubation.  Subsequently, patient was extubated on 05/27/2024.  Important events:  9/15 admitted with ICH plus IVH, EVD was placed for hydrocephalus, intubated 9/22 palliative care meeting.  Family is insistent that they want full scope of care 9/23 Extubated 10/1 -developed fevers pancultures drawn and broad-spectrum antibiotics started 10/2 became afebrile after starting antibiotics, did not tolerate raising EVD 10/6 EVD was raised from 10 to 30 cm water , tolerating well, remained afebrile 10/7 EVD is at 30 cm water , CT scan will be done tomorrow morning, no overnight issues 10/8 she became somnolent, CT head showed increasing hydrocephalus, EVD was titrated down to 10 cm of water  with improvement in mental status 10/15 VP shunt placement 10/17 transferred to progressive unit with [name]  11/18-medically stable.  Awaiting for disposition.   Assessment/Plan: Principal Problem:   Hemorrhagic stroke [facility]) Active Problems:   Moderate protein-calorie malnutrition   Pressure injury of skin   Nontraumatic subcortical hemorrhage of right cerebral hemisphere [facility])  Acute right parietotemporal intraparenchymal hemorrhage / Intraventricular hemorrhage: Uncontrolled hypertension: Obstructive hydrocephalus s/p EVD  changed to VP shunt: Seizure disorder S/p VP shunt placement on 06/18/2024 by Dr. Mavis.  Continue Vimpat .  Neurosurgery intermittently followed the patient during hospitalization.  Currently stable   ESRD on dialysis: History of failed renal transplant: Secondary hyperparathyroidism: Nephrology on board.  Continue CellCept  and prednisone .  On Monday, Wednesday, Friday hemodialysis schedule.  Will need outpatient hemodialysis set up  Acute bronchitis with fever: Resolved   History of pancreatic/kidney transplantation:  Continue cyclosporine , mycophenolate , and prednisone .   Hypertension:  Currently on Coreg , amlodipine  and clonidine  on nondialysis days.  On hydralazine  25 milligram 3 times daily.  Monitor blood pressure and adjust as necessary   Anemia of chronic disease:  Latest hemoglobin  8.4.Transfuse if Hb below 7.0   Type 1 diabetes with vasculopathy/gastroparesis:  Not on long-term insulin  anymore due to pancreatic transplant. On sliding scale at this time.   Moderate protein calorie malnutrition/dysphagia:  On PEG tube feeding.  Patient has been advanced on regular diet as well  Stage I sacral pressure ulcer present on admission: Continue wound care   Debility/deconditioning/disposition/goals of care:  Palliative care had seen the patient during hospitalization.  Family has insisted full code.   Palliative care has signed off 10/17.  Disposition pending.  TOC on board.    DVT prophylaxis: SCDs Start: 05/19/24 1724   Disposition: Skilled nursing facility status is: Inpatient  Remains inpatient appropriate because: Awaiting for disposition to skilled nursing facility,    Code Status:     Code Status: Full Code  Family Communication:  Spoke with the patient's mother at bedside 07/30/2024  Consultants: Nephrology Neurosurgery  Procedures: Ventriculoperitoneal shunt placement Hemodialysis  Anti-infectives:  None currently   Subjective: Today, patient  was seen and examined at bedside.  Patient denies interval complaints.  Denies any nausea  vomiting shortness of breath or dyspnea.   Vitals:   07/30/24 0756 07/30/24 1102  BP: (!) 163/65 (!) 146/64  Pulse: 78 78  Resp: 18 18  Temp: 97.8 F (36.6 C) 97.8 F (36.6 C)  SpO2: 100% 100%    Intake/Output Summary (Last 24 hours) at 07/30/2024 1145 Last data filed at 07/29/2024 1148 Gross per 24 hour  Intake --  Output 2000 ml  Net -2000 ml    Filed Weights   07/29/24 0500 07/29/24 1148 07/30/24 0500  Weight: 56.3 kg 55.3 kg 55.2 kg   Body mass index is 21.56 kg/m.   Physical Exam:  General:  Average built, not in obvious distress, chronically ill and deconditioned, Communicative HENT:   No scleral pallor or icterus noted. Oral mucosa is moist.  Scar in the right side of the scalp. Chest:  Clear breath sounds. No crackles or wheezes.  CVS: S1 &S2 heard. No murmur.  Regular rate and rhythm. Abdomen: Soft, nontender, nondistended.  Bowel sounds are heard.  PEG tube in place Extremities: No cyanosis, clubbing or edema.  Peripheral pulses are palpable.  Right upper extremity AV fistula in place Psych: Alert, awake and oriented, normal mood CNS:  No cranial nerve deficits.  Weakness of the extremities noted Skin: Warm and dry.  No rashes noted.   Data Review: I have personally reviewed the following laboratory data and studies,  CBC: Recent Labs  Lab 07/25/24 0841 07/27/24 1108 07/29/24 0730  WBC 3.5* 3.7* 3.7*  HGB 8.5* 8.5* 8.4*  HCT 25.9* 26.2* 26.2*  MCV 97.4 97.8 98.1  PLT 181 169 164   Basic Metabolic Panel: Recent Labs  Lab 07/25/24 0131 07/27/24 1108 07/29/24 0730  NA 127* 128* 128*  K 4.5 4.2 4.3  CL 87* 84* 88*  CO2 27 28 24   GLUCOSE 110* 125* 105*  BUN 51* 54* 50*  CREATININE 3.54* 3.99* 3.59*  CALCIUM  10.5* 10.4* 10.3  PHOS 3.8 3.7 3.7   Liver Function Tests: Recent Labs  Lab 07/25/24 0131 07/27/24 1108 07/29/24 0730  ALBUMIN  3.1* 3.0* 2.8*    No results for input(s): LIPASE, AMYLASE in the last 168 hours. No results for input(s): AMMONIA in the last 168 hours. Cardiac Enzymes: No results for input(s): CKTOTAL, CKMB, CKMBINDEX, TROPONINI in the last 168 hours. BNP (last 3 results) Recent Labs    02/14/24 0949  BNP 1,486.6*    ProBNP (last 3 results) No results for input(s): PROBNP in the last 8760 hours.  CBG: Recent Labs  Lab 07/29/24 0047 07/29/24 0718 07/29/24 1638 07/29/24 2028 07/30/24 0755  GLUCAP 137* 125* 162* 178* 88   No results found for this or any previous visit (from the past 240 hours).   Studies: DG Swallowing Func-Speech Pathology Result Date: 07/28/2024 Table formatting from the original result was not included. Modified Barium Swallow Study Patient Details Name: [name] [name] MRN: [id number] Date of Birth: 1980/10/17 Today's Date: 07/28/2024 HPI/PMH: HPI: Patient is a 43 y.o. female who is well known to SLP department from prior hospitalizations. 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 PEG placed in July 2025 and converted to GJ tube in August. She has been hospitalized since 05/19/2024 for left gaze and left sided weakness. 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 Intubated 9/15-9/23. EVD 9/15- 10/15; s/p  shunt 10/15. SLP has followed patient for dysphagia in the past, with recommendation for thin liquids/full liquids diet back in July of 2025. At that time, advancement of PO's was hindered by patient's lethargy and frequent bouts of emesis. SLP ordered for cognitive evaluation during current admission which was completed on 06/21/24, however patient did not progress due to somnolence. SLP s/o and did indicate recommendation  to reorder SLP for swallow evaluation if/when she is able to maintain adequate alertness. SLP swallow evaluation order placed by MD on 07/24/24. Clinical Impression: Pt exhibits mild oral dysphagia without observed penetration/aspiration. The oral phase is disorganized with lingual pumping but ultimate clearance. Laryngeal closure is complete and there is only trace pharyngeal residue with all consistencies tested. While her swallowing appears overall functional, her mentation and GI issues may affect safety and intake. She was fully alert for this MBS, though notes report inconsistent alertness with inability to rouse for participation in therapy on multiple occasions. She vomits frequently on TF alone, increasing the risk for inadequate hydration and nutrition as well as post-prandial aspiration (although most recent CXR 11/15 is negative for pneumonia, suggesting that if this is occurring, she is tolerating it to some degree). Discussed with MD, who will monitor level of alertness and TF with goal of resuming regular solids with thin liquids. For now, she can have any desired snack from floor stock with full supervision as long as she is fully alert. Will continue following for ongoing assessment and MD readiness to resume a regular diet. Factors that may increase risk of adverse event in presence of aspiration Noe & Lianne 2021): Factors that may increase risk of adverse event in presence of aspiration Noe & Lianne 2021): Poor general health and/or compromised immunity; Reduced cognitive function; Frail or deconditioned Recommendations/Plan: Swallowing Evaluation Recommendations Swallowing Evaluation Recommendations Recommendations: PO diet PO Diet Recommendation: Regular; Thin liquids (Level 0) Liquid Administration via: Cup; Straw Medication Administration: Via alternative means Supervision: Staff to assist with self-feeding; Full supervision/cueing for swallowing strategies Swallowing strategies  :  Minimize environmental distractions; Slow rate; Small bites/sips Postural changes: Position pt fully upright for meals; Stay upright 30-60 min after meals Oral care recommendations: Oral care BID (2x/day) Treatment Plan Treatment Plan Treatment recommendations: Therapy as outlined in treatment plan below Follow-up recommendations: Skilled nursing-short term rehab (<3 hours/day) Functional status assessment: Patient has had a recent decline in their functional status and demonstrates the ability to make significant improvements in function in a reasonable and predictable amount of time. Treatment frequency: Min 2x/week Treatment duration: 2 weeks Interventions: Aspiration precaution training; Patient/family education; Trials of upgraded texture/liquids; Diet toleration management by SLP Recommendations Recommendations for follow up therapy are one component of a multi-disciplinary discharge planning process, led by the attending physician.  Recommendations may be updated based on patient status, additional functional criteria and insurance authorization. Assessment: Orofacial Exam: Orofacial Exam Oral Cavity: Oral Hygiene: WFL Oral Cavity - Dentition: Missing dentition Orofacial Anatomy: WFL Oral Motor/Sensory Function: WFL Anatomy: Anatomy: WFL Boluses Administered: Boluses Administered Boluses Administered: Thin liquids (Level 0); Puree; Solid  Oral Impairment Domain: Oral Impairment Domain Lip Closure: No labial escape Tongue control during bolus hold: Cohesive bolus between tongue to palatal seal Bolus preparation/mastication: Timely and efficient chewing and mashing Bolus transport/lingual motion: Delayed initiation of tongue motion (oral holding) Oral residue: Complete oral clearance Location of oral residue : N/A Initiation of pharyngeal swallow : Pyriform sinuses  Pharyngeal Impairment Domain: Pharyngeal Impairment Domain Soft palate elevation: No bolus between soft palate (  SP)/pharyngeal wall (PW) Laryngeal  elevation: Complete superior movement of thyroid cartilage with complete approximation of arytenoids to epiglottic petiole Anterior hyoid excursion: Complete anterior movement Epiglottic movement: Complete inversion Laryngeal vestibule closure: Complete, no air/contrast in laryngeal vestibule Pharyngeal stripping wave : Present - complete Pharyngeal contraction (A/P view only): N/A Pharyngoesophageal segment opening: Complete distension and complete duration, no obstruction of flow Tongue base retraction: No contrast between tongue base and posterior pharyngeal wall (PPW) Pharyngeal residue: Trace residue within or on pharyngeal structures Location of pharyngeal residue: Tongue base; Pyriform sinuses  Esophageal Impairment Domain: No data recorded Pill: No data recorded Penetration/Aspiration Scale Score: Penetration/Aspiration Scale Score 1.  Material does not enter airway: Thin liquids (Level 0); Puree; Solid Compensatory Strategies: Compensatory Strategies Compensatory strategies: No   General Information: Caregiver present: No  Diet Prior to this Study: NPO; G-tube   Temperature : Normal   Respiratory Status: WFL   Supplemental O2: None (Room air)   History of Recent Intubation: Yes  Behavior/Cognition: Alert; Cooperative; Pleasant mood Self-Feeding Abilities: Able to self-feed Baseline vocal quality/speech: Normal Volitional Cough: Able to elicit Volitional Swallow: Able to elicit Exam Limitations: No limitations Goal Planning: Prognosis for improved oropharyngeal function: Fair Barriers to Reach Goals: Cognitive deficits; Time post onset No data recorded Patient/Family Stated Goal: none stated Consulted and agree with results and recommendations: Patient; Physician Pain: Pain Assessment Pain Assessment: No/denies pain End of Session: Start Time:SLP Start Time (ACUTE ONLY): 1357 Stop Time: SLP Stop Time (ACUTE ONLY): 1415 Time Calculation:SLP Time Calculation (min) (ACUTE ONLY): 18 min Charges: SLP Evaluations  $ SLP Speech Visit: 1 Visit SLP Evaluations $MBS Swallow: 1 Procedure SLP visit diagnosis: SLP Visit Diagnosis: Dysphagia, oral phase (R13.11) Past Medical History: Past Medical History: Diagnosis Date  Anemia   Anxiety   DM (diabetes mellitus), type 1 (HCC)   ESRD on hemodialysis (HCC)   Gastroparesis   History of simultaneous kidney and pancreas transplant (HCC) 2014  Hypertension   Ischemic cerebrovascular accident (CVA) (HCC) 11/2021  Narcotic abuse (HCC)   Non compliance with medical treatment   Stroke [facility])   Vision loss  Past Surgical History: Past Surgical History: Procedure Laterality Date  A/V FISTULAGRAM Right 02/17/2021  Procedure: A/V FISTULAGRAM;  Surgeon: Gretta Lonni PARAS, MD;  Location: [facility] INVASIVE CV LAB;  Service: Cardiovascular;  Laterality: Right;  AV FISTULA PLACEMENT  11/17/2011  Procedure: ARTERIOVENOUS (AV) FISTULA CREATION;  Surgeon: Krystal JULIANNA Doing, MD;  Location[facility] OR;  Service: Vascular;  Laterality: Right;  BIOPSY  12/03/2021  Procedure: BIOPSY;  Surgeon: Legrand Victory LITTIE DOUGLAS, MD;  Location: [facility] ENDOSCOPY;  Service: Gastroenterology;;  ESOPHAGOGASTRODUODENOSCOPY (EGD) WITH PROPOFOL  N/A 12/03/2021  Procedure: ESOPHAGOGASTRODUODENOSCOPY (EGD) WITH PROPOFOL ;  Surgeon: Legrand Victory LITTIE DOUGLAS, MD;  Location: [facility] ENDOSCOPY;  Service: Gastroenterology;  Laterality: N/A;  EYE SURGERY    HEMATOMA EVACUATION Right 02/25/2021  Procedure: EVACUATION HEMATOMA RIGHT CHEST;  Surgeon: Sheree Penne Lonni, MD;  Location: [facility] OR;  Service: Vascular;  Laterality: Right;  INSERTION OF DIALYSIS CATHETER Right 12/08/2020  Procedure: INSERTION OF TUNNELED DIALYSIS CATHETER;  Surgeon: Gretta Lonni PARAS, MD;  Location: [facility] OR;  Service: Vascular;  Laterality: Right;  IR ANGIO INTRA EXTRACRAN SEL INTERNAL CAROTID BILAT MOD SED  02/27/2024  IR ANGIO VERTEBRAL SEL VERTEBRAL UNI R MOD SED  02/27/2024  IR GASTROSTOMY TUBE MOD SED  03/20/2024  IR GJ TUBE CHANGE  04/15/2024  IR GJ TUBE CHANGE  05/21/2024  IR REMOVAL TUN CV CATH W/O  FL  05/03/2021  KIDNEY TRANSPLANT    LAPAROSCOPIC REVISION VENTRICULAR-PERITONEAL (V-P) SHUNT N/A 06/18/2024  Procedure: REVISION, PROCEDURE INVOLVING VENTRICULOPERITONEAL SHUNT, LAPAROSCOPIC;  Surgeon: Vernetta Berg, MD;  Location: [facility] OR;  Service: General;  Laterality: N/A;  NEPHRECTOMY TRANSPLANTED ORGAN    pancrease transplant    PERIPHERAL VASCULAR BALLOON ANGIOPLASTY Right 02/17/2021  Procedure: PERIPHERAL VASCULAR BALLOON ANGIOPLASTY;  Surgeon: Gretta Lonni PARAS, MD;  Location: [facility] INVASIVE CV LAB;  Service: Cardiovascular;  Laterality: Right;  REFRACTIVE SURGERY    REVISON OF ARTERIOVENOUS FISTULA Right 12/08/2020  Procedure: RIGHT ARM ARTERIOVENOUS FISTULA REVISION AND RESECTION;  Surgeon: Gretta Lonni PARAS, MD;  Location: [facility] OR;  Service: Vascular;  Laterality: Right;  REVISON OF ARTERIOVENOUS FISTULA Right 02/25/2021  Procedure: RIGHT ARM ARTERIOVENOUS FISTULA REVISION WITH TRANSPOSITION OF CEPHALIC VEIN ON AXILLARY VEIN;  Surgeon: Gretta Lonni PARAS, MD;  Location: [facility] OR;  Service: Vascular;  Laterality: Right;  VENTRICULOPERITONEAL SHUNT N/A 06/18/2024  Procedure: SHUNT INSERTION VENTRICULAR-PERITONEAL;  Surgeon: Mavis Purchase, MD;  Location: [facility] OR;  Service: Neurosurgery;  Laterality: N/A;  VP SHUNT Damien Blumenthal, M.A., CCC-SLP Speech Language Pathology, Acute Rehabilitation Services Secure Chat preferred [phone] 07/28/2024, 3:22 PM     Vernal Alstrom, MD  [name] 07/30/2024  If 7PM-7AM, please contact night-coverage

## 2024-07-30 NOTE — Plan of Care (Signed)
 Problem: Education: Goal: Ability to describe self-care measures that may prevent or decrease complications (Diabetes Survival Skills Education) will improve 07/30/2024 0540 by Cleotilde Deanie HERO, RN Outcome: Progressing 07/30/2024 0415 by Cleotilde Deanie HERO, RN Outcome: Progressing Goal: Individualized Educational Video(s) 07/30/2024 0540 by Cleotilde Deanie HERO, RN Outcome: Progressing 07/30/2024 0415 by Cleotilde Deanie HERO, RN Outcome: Progressing   Problem: Coping: Goal: Ability to adjust to condition or change in health will improve 07/30/2024 0540 by Cleotilde Deanie HERO, RN Outcome: Progressing 07/30/2024 0415 by Cleotilde Deanie HERO, RN Outcome: Progressing   Problem: Fluid Volume: Goal: Ability to maintain a balanced intake and output will improve 07/30/2024 0540 by Cleotilde Deanie HERO, RN Outcome: Progressing 07/30/2024 0415 by Cleotilde Deanie HERO, RN Outcome: Progressing   Problem: Health Behavior/Discharge Planning: Goal: Ability to identify and utilize available resources and services will improve 07/30/2024 0540 by Cleotilde Deanie HERO, RN Outcome: Progressing 07/30/2024 0415 by Cleotilde Deanie HERO, RN Outcome: Progressing Goal: Ability to manage health-related needs will improve 07/30/2024 0540 by Cleotilde Deanie HERO, RN Outcome: Progressing 07/30/2024 0415 by Cleotilde Deanie HERO, RN Outcome: Progressing   Problem: Metabolic: Goal: Ability to maintain appropriate glucose levels will improve 07/30/2024 0540 by Cleotilde Deanie HERO, RN Outcome: Progressing 07/30/2024 0415 by Cleotilde Deanie HERO, RN Outcome: Progressing   Problem: Nutritional: Goal: Maintenance of adequate nutrition will improve 07/30/2024 0540 by Cleotilde Deanie HERO, RN Outcome: Progressing 07/30/2024 0415 by Cleotilde Deanie HERO, RN Outcome: Progressing Goal: Progress toward achieving an optimal weight will improve 07/30/2024 0540 by Cleotilde Deanie HERO, RN Outcome: Progressing 07/30/2024 0415 by Cleotilde Deanie HERO, RN Outcome:  Progressing   Problem: Skin Integrity: Goal: Risk for impaired skin integrity will decrease 07/30/2024 0540 by Cleotilde Deanie HERO, RN Outcome: Progressing 07/30/2024 0415 by Cleotilde Deanie HERO, RN Outcome: Progressing   Problem: Tissue Perfusion: Goal: Adequacy of tissue perfusion will improve 07/30/2024 0540 by Cleotilde Deanie HERO, RN Outcome: Progressing 07/30/2024 0415 by Cleotilde Deanie HERO, RN Outcome: Progressing   Problem: Education: Goal: Knowledge of disease or condition will improve 07/30/2024 0540 by Cleotilde Deanie HERO, RN Outcome: Progressing 07/30/2024 0415 by Cleotilde Deanie HERO, RN Outcome: Progressing Goal: Knowledge of secondary prevention will improve (MUST DOCUMENT ALL) 07/30/2024 0540 by Cleotilde Deanie HERO, RN Outcome: Progressing 07/30/2024 0415 by Cleotilde Deanie HERO, RN Outcome: Progressing Goal: Knowledge of patient specific risk factors will improve (DELETE if not current risk factor) 07/30/2024 0540 by Cleotilde Deanie HERO, RN Outcome: Progressing 07/30/2024 0415 by Cleotilde Deanie HERO, RN Outcome: Progressing   Problem: Intracerebral Hemorrhage Tissue Perfusion: Goal: Complications of Intracerebral Hemorrhage will be minimized 07/30/2024 0540 by Cleotilde Deanie HERO, RN Outcome: Progressing 07/30/2024 0415 by Cleotilde Deanie HERO, RN Outcome: Progressing   Problem: Coping: Goal: Will verbalize positive feelings about self 07/30/2024 0540 by Cleotilde Deanie HERO, RN Outcome: Progressing 07/30/2024 0415 by Cleotilde Deanie HERO, RN Outcome: Progressing Goal: Will identify appropriate support needs 07/30/2024 0540 by Cleotilde Deanie HERO, RN Outcome: Progressing 07/30/2024 0415 by Cleotilde Deanie HERO, RN Outcome: Progressing   Problem: Health Behavior/Discharge Planning: Goal: Ability to manage health-related needs will improve 07/30/2024 0540 by Cleotilde Deanie HERO, RN Outcome: Progressing 07/30/2024 0415 by Cleotilde Deanie HERO, RN Outcome: Progressing Goal: Goals will be collaboratively  established with patient/family 07/30/2024 0540 by Cleotilde Deanie HERO, RN Outcome: Progressing 07/30/2024 0415 by Cleotilde Deanie HERO, RN Outcome: Progressing   Problem: Self-Care: Goal: Ability to participate in self-care as condition permits will improve 07/30/2024 0540 by Cleotilde Deanie HERO, RN Outcome: Progressing 07/30/2024  [id number] by Cleotilde Deanie HERO, RN Outcome: Progressing Goal: Verbalization of feelings and concerns over difficulty with self-care will improve 07/30/2024 0540 by Cleotilde Deanie HERO, RN Outcome: Progressing 07/30/2024 0415 by Cleotilde Deanie HERO, RN Outcome: Progressing Goal: Ability to communicate needs accurately will improve 07/30/2024 0540 by Cleotilde Deanie HERO, RN Outcome: Progressing 07/30/2024 0415 by Cleotilde Deanie HERO, RN Outcome: Progressing   Problem: Nutrition: Goal: Risk of aspiration will decrease 07/30/2024 0540 by Cleotilde Deanie HERO, RN Outcome: Progressing 07/30/2024 0415 by Cleotilde Deanie HERO, RN Outcome: Progressing Goal: Dietary intake will improve 07/30/2024 0540 by Cleotilde Deanie HERO, RN Outcome: Progressing 07/30/2024 0415 by Cleotilde Deanie HERO, RN Outcome: Progressing   Problem: Education: Goal: Knowledge of General Education information will improve Description: Including pain rating scale, medication(s)/side effects and non-pharmacologic comfort measures 07/30/2024 0540 by Cleotilde Deanie HERO, RN Outcome: Progressing 07/30/2024 0415 by Cleotilde Deanie HERO, RN Outcome: Progressing   Problem: Health Behavior/Discharge Planning: Goal: Ability to manage health-related needs will improve 07/30/2024 0540 by Cleotilde Deanie HERO, RN Outcome: Progressing 07/30/2024 0415 by Cleotilde Deanie HERO, RN Outcome: Progressing   Problem: Clinical Measurements: Goal: Ability to maintain clinical measurements within normal limits will improve 07/30/2024 0540 by Cleotilde Deanie HERO, RN Outcome: Progressing 07/30/2024 0415 by Cleotilde Deanie HERO, RN Outcome: Progressing Goal:  Will remain free from infection 07/30/2024 0540 by Cleotilde Deanie HERO, RN Outcome: Progressing 07/30/2024 0415 by Cleotilde Deanie HERO, RN Outcome: Progressing Goal: Diagnostic test results will improve 07/30/2024 0540 by Cleotilde Deanie HERO, RN Outcome: Progressing 07/30/2024 0415 by Cleotilde Deanie HERO, RN Outcome: Progressing Goal: Respiratory complications will improve 07/30/2024 0540 by Cleotilde Deanie HERO, RN Outcome: Progressing 07/30/2024 0415 by Cleotilde Deanie HERO, RN Outcome: Progressing Goal: Cardiovascular complication will be avoided 07/30/2024 0540 by Cleotilde Deanie HERO, RN Outcome: Progressing 07/30/2024 0415 by Cleotilde Deanie HERO, RN Outcome: Progressing   Problem: Activity: Goal: Risk for activity intolerance will decrease 07/30/2024 0540 by Cleotilde Deanie HERO, RN Outcome: Progressing 07/30/2024 0415 by Cleotilde Deanie HERO, RN Outcome: Progressing   Problem: Nutrition: Goal: Adequate nutrition will be maintained 07/30/2024 0540 by Cleotilde Deanie HERO, RN Outcome: Progressing 07/30/2024 0415 by Cleotilde Deanie HERO, RN Outcome: Progressing   Problem: Coping: Goal: Level of anxiety will decrease 07/30/2024 0540 by Cleotilde Deanie HERO, RN Outcome: Progressing 07/30/2024 0415 by Cleotilde Deanie HERO, RN Outcome: Progressing   Problem: Elimination: Goal: Will not experience complications related to bowel motility 07/30/2024 0540 by Cleotilde Deanie HERO, RN Outcome: Progressing 07/30/2024 0415 by Cleotilde Deanie HERO, RN Outcome: Progressing   Problem: Pain Managment: Goal: General experience of comfort will improve and/or be controlled 07/30/2024 0540 by Cleotilde Deanie HERO, RN Outcome: Progressing 07/30/2024 0415 by Cleotilde Deanie HERO, RN Outcome: Progressing   Problem: Safety: Goal: Ability to remain free from injury will improve 07/30/2024 0540 by Cleotilde Deanie HERO, RN Outcome: Progressing 07/30/2024 0415 by Cleotilde Deanie HERO, RN Outcome: Progressing   Problem: Skin Integrity: Goal: Risk for  impaired skin integrity will decrease 07/30/2024 0540 by Cleotilde Deanie HERO, RN Outcome: Progressing 07/30/2024 0415 by Cleotilde Deanie HERO, RN Outcome: Progressing   Problem: Education: Goal: Knowledge of the prescribed therapeutic regimen will improve 07/30/2024 0540 by Cleotilde Deanie HERO, RN Outcome: Progressing 07/30/2024 0415 by Cleotilde Deanie HERO, RN Outcome: Progressing   Problem: Clinical Measurements: Goal: Usual level of consciousness will be regained or maintained. 07/30/2024 0540 by Cleotilde Deanie HERO, RN Outcome: Progressing 07/30/2024 0415 by Cleotilde Deanie HERO, RN Outcome: Progressing Goal: Neurologic status will improve 07/30/2024  [id number] by Cleotilde Deanie HERO, RN Outcome: Progressing 07/30/2024 0415 by Cleotilde Deanie HERO, RN Outcome: Progressing Goal: Ability to maintain intracranial pressure will improve 07/30/2024 0540 by Cleotilde Deanie HERO, RN Outcome: Progressing 07/30/2024 0415 by Cleotilde Deanie HERO, RN Outcome: Progressing   Problem: Skin Integrity: Goal: Demonstration of wound healing without infection will improve 07/30/2024 0540 by Cleotilde Deanie HERO, RN Outcome: Progressing 07/30/2024 0415 by Cleotilde Deanie HERO, RN Outcome: Progressing   Problem: Education: Goal: Knowledge of disease and its progression will improve 07/30/2024 0540 by Cleotilde Deanie HERO, RN Outcome: Progressing 07/30/2024 0415 by Cleotilde Deanie HERO, RN Outcome: Progressing Goal: Individualized Educational Video(s) 07/30/2024 0540 by Cleotilde Deanie HERO, RN Outcome: Progressing 07/30/2024 0415 by Cleotilde Deanie HERO, RN Outcome: Progressing   Problem: Fluid Volume: Goal: Compliance with measures to maintain balanced fluid volume will improve 07/30/2024 0540 by Cleotilde Deanie HERO, RN Outcome: Progressing 07/30/2024 0415 by Cleotilde Deanie HERO, RN Outcome: Progressing

## 2024-07-30 NOTE — Progress Notes (Signed)
 Physical Therapy Treatment Patient Details Name: [name] [name] MRN: [id number] DOB: [birth year] Today's Date: 07/30/2024   History of Present Illness 43 yo female presents 05/19/24 for left gaze and L side weakness. 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 Intubated 9/15-9/23. EVD 9/15- 10/15; s/p shunt 10/15. S/p laparoscopy with intra-abdominal assistance ventricular-peritoneal shunt placement 10/15. 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 Pt able to increase gait distance with 2 seated rest breaks due to quick fatigue. Education provided on activity progression and pt encouraged to increase time OOB in recliner and use BSC instead of bedpan for improved strength and endurance.  Patient will benefit from continued inpatient follow up therapy, <3 hours/day to maximize progress towards functional mobility goals. Acutely, pt continues to benefit from PT services to progress toward functional mobility goals.    If plan is discharge home, recommend the following: A lot of help with walking and/or transfers;A lot of help with bathing/dressing/bathroom;Assistance with cooking/housework;Direct supervision/assist for medications management;Direct supervision/assist for financial management;Assist for transportation;Help with stairs or ramp for entrance   Can travel by private vehicle     No  Equipment Recommendations  Hospital bed;Hoyer lift;Wheelchair (measurements PT);Wheelchair cushion (measurements PT);BSC/3in1    Recommendations for Other Services       Precautions / Restrictions Precautions Precautions: Fall Recall of Precautions/Restrictions:  Impaired Precaution/Restrictions Comments: PEG Restrictions Weight Bearing Restrictions Per Provider Order: No     Mobility  Bed Mobility Overal bed mobility: Needs Assistance Bed Mobility: Supine to Sit     Supine to sit: HOB elevated, Used rails, Min assist     General bed mobility comments: very light min A for reaching to rail and trunk elevation    Transfers Overall transfer level: Needs assistance Equipment used: Rolling walker (2 wheels) Transfers: Sit to/from Stand Sit to Stand: Min assist           General transfer comment: STS from EOB and recliner with min A for rise and cues for hand placement    Ambulation/Gait Ambulation/Gait assistance: Min assist, +2 safety/equipment Gait Distance (Feet): 10 Feet (+10, +20) Assistive device: Rolling walker (2 wheels) Gait Pattern/deviations: Step-through pattern, Decreased stride length, Drifts right/left, Trunk flexed       General Gait Details: Cues for upright posture, but pt maintains shoulder elevation and trunk flexion. Improved stride length with progressed distance. Quick fatigue requiring 2 seated rest breaks   Stairs             Wheelchair Mobility     Tilt Bed    Modified Rankin (Stroke Patients Only) Modified Rankin (Stroke Patients Only) Pre-Morbid Rankin Score: Moderately severe disability Modified Rankin: Moderately severe disability     Balance Overall balance assessment: Needs assistance Sitting-balance support: No upper extremity supported, Feet supported Sitting balance-Leahy Scale: Good Sitting balance - Comments: seated EOB, no LOB   Standing balance support: Bilateral upper extremity supported, During functional activity, Reliant on assistive device for balance Standing balance-Leahy Scale: Poor Standing balance comment: reliant on RW support                            Communication Communication Communication: No apparent difficulties  Cognition Arousal:  Alert Behavior During Therapy: WFL for tasks assessed/performed   PT - Cognitive impairments: Problem solving, Sequencing, Awareness                         Following commands: Impaired Following commands impaired: Follows one step commands with increased time, Follows multi-step commands inconsistently    Cueing Cueing Techniques: Verbal cues, Gestural cues, Visual cues  Exercises      General Comments        Pertinent Vitals/Pain Pain Assessment Pain Assessment: No/denies pain     PT Goals (current goals can now be found in the care plan section) Acute Rehab PT Goals Patient Stated Goal: unable to state goal PT Goal Formulation: Patient unable to participate in goal setting Time For Goal Achievement: 07/24/24 Progress towards PT goals: Progressing toward goals    Frequency    Min 2X/week       AM-PAC PT 6 Clicks Mobility   Outcome Measure  Help needed turning from your back to your side while in a flat bed without using bedrails?: A Little Help needed moving from lying on your back to sitting on the side of a flat bed without using bedrails?: A Little Help needed moving to and from a bed to a chair (including a wheelchair)?: A Little Help needed standing up from a chair using your arms (e.g., wheelchair or bedside chair)?: A Little Help needed to walk in hospital room?: A Little Help needed climbing 3-5 steps with a railing? : Total 6 Click Score: 16    End of Session Equipment Utilized During Treatment: Gait belt Activity Tolerance: Patient limited by fatigue;Patient tolerated treatment well Patient left: with call bell/phone within reach;with family/visitor present;in chair;with chair alarm set Nurse Communication: Mobility status PT Visit Diagnosis: Unsteadiness on feet (R26.81);Other abnormalities of gait and mobility (R26.89);Muscle weakness (generalized) (M62.81);Difficulty in walking, not elsewhere classified (R26.2);Other symptoms and signs  involving the nervous system (R29.898)     Time: 1321-1350 PT Time Calculation (min) (ACUTE ONLY): 29 min  Charges:    $Gait Training: 8-22 mins $Therapeutic Activity: 8-22 mins PT General Charges $$ ACUTE PT VISIT: 1 Visit                    Darryle George, PTA Acute Rehabilitation Services Secure Chat Preferred  [phone]    Darryle George 07/30/2024, 2:06 PM

## 2024-07-30 NOTE — Progress Notes (Signed)
 Speech Language Pathology Treatment: Dysphagia  Patient Details Name: [name] [name] MRN: [id number] DOB: [birth year] Today's Date: 07/30/2024 Time: 8350-8340 SLP Time Calculation (min) (ACUTE ONLY): 10 min  Assessment / Plan / Recommendation Clinical Impression  Pt and her mother discuss intake over the last two days without emesis. Observed Angel feed herself regular solids and thin liquids without overt s/s of aspiration. Oral transit was efficient and clearance was complete. Continue current diet. Could f/u with SLP at her next venue of care but no acute needs are identified, signing off.    HPI HPI: Patient is a 43 y.o. female who is well known to SLP department from prior hospitalizations. 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 PEG placed in July 2025 and converted to GJ tube in August. She has been hospitalized since 05/19/2024 for left gaze and left sided weakness. 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 Intubated 9/15-9/23. EVD 9/15- 10/15; s/p shunt 10/15. SLP has followed patient for dysphagia in the past, with recommendation for thin liquids/full liquids diet back in July of 2025. At that time, advancement of PO's was hindered by patient's lethargy and frequent bouts of emesis. SLP ordered for cognitive evaluation during current admission which was completed on 06/21/24, however patient did not progress due to somnolence. SLP s/o and did indicate recommendation to reorder SLP for swallow evaluation if/when she is able to maintain adequate alertness. SLP swallow evaluation order placed by MD on 07/24/24.      SLP Plan             Recommendations  Diet recommendations: Regular;Thin liquid Liquids provided via:  Cup;Straw Medication Administration: Whole meds with liquid Supervision: Staff to assist with self feeding;Intermittent supervision to cue for compensatory strategies;Trained caregiver to feed patient Compensations: Slow rate;Small sips/bites Postural Changes and/or Swallow Maneuvers: Out of bed for meals                  Oral care BID   Frequent or constant Supervision/Assistance Dysphagia, oral phase (R13.11)           Damien Blumenthal, M.A., CCC-SLP Speech Language Pathology, Acute Rehabilitation Services  Secure Chat preferred [phone]   07/30/2024, 5:00 PM

## 2024-07-30 NOTE — Progress Notes (Signed)
 Nutrition Follow-up  DOCUMENTATION CODES:  Non-severe (moderate) malnutrition in context of chronic illness  INTERVENTION:  Continue tube feeding via G-J tube: Nepro at 45 ml/h (1080 ml per day) Provides 1912 kcal, 87 gm protein, 785 ml free water  daily Banatrol TID  MVI daily 48-hour kcal count to determine if PO intake is sufficient/consistent enough to reduce TF infusion rate  NUTRITION DIAGNOSIS:  Moderate Malnutrition related to chronic illness (ESRD on HD/DM and gastroparesis) as evidenced by severe muscle depletion, mild fat depletion. - remains applicable  GOAL:  Patient will meet greater than or equal to 90% of their needs - progressing  MONITOR:  PO intake, Weight trends, TF tolerance, I & O's, Labs  REASON FOR ASSESSMENT:  Consult Enteral/tube feeding initiation and management  ASSESSMENT:  43 year old female who presented as a Code Stroke with IVH. 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 Recent admissions 6/11-7/28 and 7/29-8/13 for ICH with GJ tube.  9/16 - Admitted, EVD placed, intubated 9/21 - switched to Nepro formula; FMS placed 9/22 - Extubated  9/23 - adjusted rate of Nepro to meet needs (was previously exceeding) 9/25 - D/C Nepro, Started Osmolite 1.5 10/3 - Renal changed TF to Nepro due to higher K (likely due to no HD over weekend) 10/15 s/p conversion from EVD to VP shunt  10/16 - Transferred to PCU 11/24 - MBS, ok for regular/thin when awake and alert  Pt resting in bed at the time of assessment awake and alert this AM. Mom at bedside assists with hx. They report pt did take in a little food at dinner last night. Ate a few bites of fish and carrots. Mom also reports that she had some apple juice and a little milk with honey.   Discussed with pt and family that it was very  probable pt would needs feeds long-term but that if intake was consistent, we may be able to decrease the amount infusing and adjust to a cyclic or nocturnal feed. Pt and mother expressed understanding. Will start kcal count to be collected and calculated on 11/28. Discussed with RN.  Admit weight: 50.4 kg  Current weight: 55.2 kg   EDW 55.2kg   Nutritionally Relevant Medications: Scheduled Meds:  cinacalcet   30 mg Oral Q M,W,F   cycloSPORINE   225 mg Per Tube Daily   famotidine   10 mg Per Tube QHS   NEPRO CARB STEADY  1,000 mL Per Tube Q24H   BANATROL TF  60 mL Per Tube TID   hydrALAZINE   25 mg Oral Q8H   multivitamin with minerals  1 tablet Per Tube Daily   mycophenolate   500 mg Per Tube BID   pantoprazole  IV  40 mg Intravenous Daily   predniSONE   15 mg Per Tube Q breakfast   PRN Meds: loperamide , ondansetron , phenol, sennosides  Labs Reviewed: CBG ranges from 88-125 mg/dL over the last 24 hours HgbA1c 3.6% (9/16)  Micronutrient Profile: CRP 0.6 Copper  66, low (10/4) Folate >20 (10/4)  Vitamin C 1.6 (10/4) Vitamin D  37.13 (10/12) Zinc  74 (10/4)  NUTRITION - FOCUSED PHYSICAL EXAM: Flowsheet Row Most Recent Value  Orbital Region Mild depletion  Upper Arm Region Moderate depletion  Thoracic and Lumbar Region Mild depletion  Buccal Region Mild depletion  Temple Region Severe depletion  Clavicle Bone Region Severe depletion  Clavicle and Acromion Bone Region Severe depletion  Scapular Bone Region  Severe depletion  Dorsal Hand Moderate depletion  Patellar Region Severe depletion  Anterior Thigh Region Severe depletion  Posterior Calf Region Severe depletion  Edema (RD Assessment) Mild  Hair Reviewed  Eyes Unable to assess  Mouth Unable to assess  Skin Reviewed  [Closed surgical incision on head]  Nails Reviewed    Diet Order:   Diet Order             Diet regular Room service appropriate? Yes; Fluid consistency: Thin  Diet effective now                    EDUCATION NEEDS:  Education needs have been addressed  Skin:  Skin Assessment: Reviewed RN documentation  Last BM:  11/24 per flowsheet  Height:  Ht Readings from Last 1 Encounters:  06/18/24 5' 3 (1.6 m)    Weight:  Wt Readings from Last 1 Encounters:  07/30/24 55.2 kg    Ideal Body Weight:  52.3 kg  BMI:  Body mass index is 21.56 kg/m.  Estimated Nutritional Needs:  Kcal:  1700-1900 Protein:  80-100 grams Fluid:  1L +UOP    Vernell Lukes, RD, LDN, CNSC Registered Dietitian II Please reach out via secure chat

## 2024-07-30 NOTE — Progress Notes (Signed)
 Patient ID: [name] [name], female   DOB: [birth year], 43 y.o.   MRN: [id number] S: No new complaints and tolerated HD well yesterday O:BP (!) 163/65 (BP Location: Left Arm)   Pulse 78   Temp 97.8 F (36.6 C) (Oral)   Resp 18   Ht 5' 3 (1.6 m)   Wt 55.2 kg   SpO2 100%   BMI 21.56 kg/m   Intake/Output Summary (Last 24 hours) at 07/30/2024 0947 Last data filed at 07/29/2024 1148 Gross per 24 hour  Intake --  Output 2000 ml  Net -2000 ml   Intake/Output: I/O last 3 completed shifts: In: -  Out: 2000 [Other:2000]  Intake/Output this shift:  No intake/output data recorded. Weight change: -1 kg Gen: NAD CVS: RRR Resp:CTA Abd: +BS, soft, NT/ND Ext: no edema, LUE AVF +T/B  Recent Labs  Lab 07/25/24 0131 07/27/24 1108 07/29/24 0730  NA 127* 128* 128*  K 4.5 4.2 4.3  CL 87* 84* 88*  CO2 27 28 24   GLUCOSE 110* 125* 105*  BUN 51* 54* 50*  CREATININE 3.54* 3.99* 3.59*  ALBUMIN  3.1* 3.0* 2.8*  CALCIUM  10.5* 10.4* 10.3  PHOS 3.8 3.7 3.7   Liver Function Tests: Recent Labs  Lab 07/25/24 0131 07/27/24 1108 07/29/24 0730  ALBUMIN  3.1* 3.0* 2.8*   No results for input(s): LIPASE, AMYLASE in the last 168 hours. No results for input(s): AMMONIA in the last 168 hours. CBC: Recent Labs  Lab 07/25/24 0841 07/27/24 1108 07/29/24 0730  WBC 3.5* 3.7* 3.7*  HGB 8.5* 8.5* 8.4*  HCT 25.9* 26.2* 26.2*  MCV 97.4 97.8 98.1  PLT 181 169 164   Cardiac Enzymes: No results for input(s): CKTOTAL, CKMB, CKMBINDEX, TROPONINI in the last 168 hours. CBG: Recent Labs  Lab 07/29/24 0047 07/29/24 0718 07/29/24 1638 07/29/24 2028 07/30/24 0755  GLUCAP 137* 125* 162* 178* 88    Iron  Studies: No results for input(s): IRON , TIBC, TRANSFERRIN, FERRITIN in the last 72 hours. Studies/Results: DG Swallowing Func-Speech Pathology Result Date: 07/28/2024 Table formatting from the original result was not included. Modified Barium Swallow Study Patient Details  Name: [name] [name] MRN: [id number] Date of Birth: 11/16/1980 Today's Date: 07/28/2024 HPI/PMH: HPI: Patient is a 43 y.o. female who is well known to SLP department from prior hospitalizations. 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 PEG placed in July 2025 and converted to GJ tube in August. She has been hospitalized since 05/19/2024 for left gaze and left sided weakness. 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 Intubated 9/15-9/23. EVD 9/15- 10/15; s/p shunt 10/15. SLP has followed patient for dysphagia in the past, with recommendation for thin liquids/full liquids diet back in July of 2025. At that time, advancement of PO's was hindered by patient's lethargy and frequent bouts of emesis. SLP ordered for cognitive evaluation during current admission which was completed on 06/21/24, however patient did not progress due to somnolence. SLP s/o and did indicate recommendation to reorder SLP for swallow evaluation if/when she is able to maintain adequate alertness. SLP swallow evaluation order placed by MD on 07/24/24. Clinical Impression: Pt exhibits mild oral dysphagia without observed penetration/aspiration. The oral phase is disorganized with lingual pumping but ultimate clearance. Laryngeal closure is complete and there is only trace pharyngeal residue with all consistencies tested. While her  swallowing appears overall functional, her mentation and GI issues may affect safety and intake. She was fully alert for this MBS, though notes report inconsistent alertness with inability to rouse for participation in therapy on multiple occasions. She vomits frequently on TF alone, increasing the risk for inadequate hydration and nutrition as well as  post-prandial aspiration (although most recent CXR 11/15 is negative for pneumonia, suggesting that if this is occurring, she is tolerating it to some degree). Discussed with MD, who will monitor level of alertness and TF with goal of resuming regular solids with thin liquids. For now, she can have any desired snack from floor stock with full supervision as long as she is fully alert. Will continue following for ongoing assessment and MD readiness to resume a regular diet. Factors that may increase risk of adverse event in presence of aspiration Noe & Lianne 2021): Factors that may increase risk of adverse event in presence of aspiration Noe & Lianne 2021): Poor general health and/or compromised immunity; Reduced cognitive function; Frail or deconditioned Recommendations/Plan: Swallowing Evaluation Recommendations Swallowing Evaluation Recommendations Recommendations: PO diet PO Diet Recommendation: Regular; Thin liquids (Level 0) Liquid Administration via: Cup; Straw Medication Administration: Via alternative means Supervision: Staff to assist with self-feeding; Full supervision/cueing for swallowing strategies Swallowing strategies  : Minimize environmental distractions; Slow rate; Small bites/sips Postural changes: Position pt fully upright for meals; Stay upright 30-60 min after meals Oral care recommendations: Oral care BID (2x/day) Treatment Plan Treatment Plan Treatment recommendations: Therapy as outlined in treatment plan below Follow-up recommendations: Skilled nursing-short term rehab (<3 hours/day) Functional status assessment: Patient has had a recent decline in their functional status and demonstrates the ability to make significant improvements in function in a reasonable and predictable amount of time. Treatment frequency: Min 2x/week Treatment duration: 2 weeks Interventions: Aspiration precaution training; Patient/family education; Trials of upgraded texture/liquids; Diet toleration  management by SLP Recommendations Recommendations for follow up therapy are one component of a multi-disciplinary discharge planning process, led by the attending physician.  Recommendations may be updated based on patient status, additional functional criteria and insurance authorization. Assessment: Orofacial Exam: Orofacial Exam Oral Cavity: Oral Hygiene: WFL Oral Cavity - Dentition: Missing dentition Orofacial Anatomy: WFL Oral Motor/Sensory Function: WFL Anatomy: Anatomy: WFL Boluses Administered: Boluses Administered Boluses Administered: Thin liquids (Level 0); Puree; Solid  Oral Impairment Domain: Oral Impairment Domain Lip Closure: No labial escape Tongue control during bolus hold: Cohesive bolus between tongue to palatal seal Bolus preparation/mastication: Timely and efficient chewing and mashing Bolus transport/lingual motion: Delayed initiation of tongue motion (oral holding) Oral residue: Complete oral clearance Location of oral residue : N/A Initiation of pharyngeal swallow : Pyriform sinuses  Pharyngeal Impairment Domain: Pharyngeal Impairment Domain Soft palate elevation: No bolus between soft palate (SP)/pharyngeal wall (PW) Laryngeal elevation: Complete superior movement of thyroid cartilage with complete approximation of arytenoids to epiglottic petiole Anterior hyoid excursion: Complete anterior movement Epiglottic movement: Complete inversion Laryngeal vestibule closure: Complete, no air/contrast in laryngeal vestibule Pharyngeal stripping wave : Present - complete Pharyngeal contraction (A/P view only): N/A Pharyngoesophageal segment opening: Complete distension and complete duration, no obstruction of flow Tongue base retraction: No contrast between tongue base and posterior pharyngeal wall (PPW) Pharyngeal residue: Trace residue within or on pharyngeal structures Location of pharyngeal residue: Tongue base; Pyriform sinuses  Esophageal Impairment Domain: No data recorded Pill: No data  recorded Penetration/Aspiration Scale Score: Penetration/Aspiration Scale Score 1.  Material does not enter airway: Thin liquids (Level 0); Puree; Solid Compensatory Strategies: Compensatory  Strategies Compensatory strategies: No   General Information: Caregiver present: No  Diet Prior to this Study: NPO; G-tube   Temperature : Normal   Respiratory Status: WFL   Supplemental O2: None (Room air)   History of Recent Intubation: Yes  Behavior/Cognition: Alert; Cooperative; Pleasant mood Self-Feeding Abilities: Able to self-feed Baseline vocal quality/speech: Normal Volitional Cough: Able to elicit Volitional Swallow: Able to elicit Exam Limitations: No limitations Goal Planning: Prognosis for improved oropharyngeal function: Fair Barriers to Reach Goals: Cognitive deficits; Time post onset No data recorded Patient/Family Stated Goal: none stated Consulted and agree with results and recommendations: Patient; Physician Pain: Pain Assessment Pain Assessment: No/denies pain End of Session: Start Time:SLP Start Time (ACUTE ONLY): 1357 Stop Time: SLP Stop Time (ACUTE ONLY): 1415 Time Calculation:SLP Time Calculation (min) (ACUTE ONLY): 18 min Charges: SLP Evaluations $ SLP Speech Visit: 1 Visit SLP Evaluations $MBS Swallow: 1 Procedure SLP visit diagnosis: SLP Visit Diagnosis: Dysphagia, oral phase (R13.11) Past Medical History: Past Medical History: Diagnosis Date  Anemia   Anxiety   DM (diabetes mellitus), type 1 (HCC)   ESRD on hemodialysis (HCC)   Gastroparesis   History of simultaneous kidney and pancreas transplant (HCC) 2014  Hypertension   Ischemic cerebrovascular accident (CVA) (HCC) 11/2021  Narcotic abuse (HCC)   Non compliance with medical treatment   Stroke [facility])   Vision loss  Past Surgical History: Past Surgical History: Procedure Laterality Date  A/V FISTULAGRAM Right 02/17/2021  Procedure: A/V FISTULAGRAM;  Surgeon: Gretta Lonni PARAS, MD;  Location: [facility] INVASIVE CV LAB;  Service: Cardiovascular;   Laterality: Right;  AV FISTULA PLACEMENT  11/17/2011  Procedure: ARTERIOVENOUS (AV) FISTULA CREATION;  Surgeon: Krystal JULIANNA Doing, MD;  Location: [facility] OR;  Service: Vascular;  Laterality: Right;  BIOPSY  12/03/2021  Procedure: BIOPSY;  Surgeon: Legrand Victory LITTIE DOUGLAS, MD;  Location: [facility] ENDOSCOPY;  Service: Gastroenterology;;  ESOPHAGOGASTRODUODENOSCOPY (EGD) WITH PROPOFOL  N/A 12/03/2021  Procedure: ESOPHAGOGASTRODUODENOSCOPY (EGD) WITH PROPOFOL ;  Surgeon: Legrand Victory LITTIE DOUGLAS, MD;  Location: [facility] ENDOSCOPY;  Service: Gastroenterology;  Laterality: N/A;  EYE SURGERY    HEMATOMA EVACUATION Right 02/25/2021  Procedure: EVACUATION HEMATOMA RIGHT CHEST;  Surgeon: Sheree Penne Lonni, MD;  Location: [facility] OR;  Service: Vascular;  Laterality: Right;  INSERTION OF DIALYSIS CATHETER Right 12/08/2020  Procedure: INSERTION OF TUNNELED DIALYSIS CATHETER;  Surgeon: Gretta Lonni PARAS, MD;  Location: [facility] OR;  Service: Vascular;  Laterality: Right;  IR ANGIO INTRA EXTRACRAN SEL INTERNAL CAROTID BILAT MOD SED  02/27/2024  IR ANGIO VERTEBRAL SEL VERTEBRAL UNI R MOD SED  02/27/2024  IR GASTROSTOMY TUBE MOD SED  03/20/2024  IR GJ TUBE CHANGE  04/15/2024  IR GJ TUBE CHANGE  05/21/2024  IR REMOVAL TUN CV CATH W/O FL  05/03/2021  KIDNEY TRANSPLANT    LAPAROSCOPIC REVISION VENTRICULAR-PERITONEAL (V-P) SHUNT N/A 06/18/2024  Procedure: REVISION, PROCEDURE INVOLVING VENTRICULOPERITONEAL SHUNT, LAPAROSCOPIC;  Surgeon: Vernetta Berg, MD;  Location: [facility] OR;  Service: General;  Laterality: N/A;  NEPHRECTOMY TRANSPLANTED ORGAN    pancrease transplant    PERIPHERAL VASCULAR BALLOON ANGIOPLASTY Right 02/17/2021  Procedure: PERIPHERAL VASCULAR BALLOON ANGIOPLASTY;  Surgeon: Gretta Lonni PARAS, MD;  [facility]: [facility] INVASIVE CV LAB;  Service: Cardiovascular;  Laterality: Right;  REFRACTIVE SURGERY    REVISON OF ARTERIOVENOUS FISTULA Right 12/08/2020  Procedure: RIGHT ARM ARTERIOVENOUS FISTULA REVISION AND RESECTION;  Surgeon: Gretta Lonni PARAS, MD;  Location: [facility] OR;   Service: Vascular;  Laterality: Right;  REVISON OF ARTERIOVENOUS FISTULA Right 02/25/2021  Procedure: RIGHT ARM ARTERIOVENOUS FISTULA  REVISION WITH TRANSPOSITION OF CEPHALIC VEIN ON AXILLARY VEIN;  Surgeon: Gretta Lonni PARAS, MD;  Location: [facility] OR;  Service: Vascular;  Laterality: Right;  VENTRICULOPERITONEAL SHUNT N/A 06/18/2024  Procedure: SHUNT INSERTION VENTRICULAR-PERITONEAL;  Surgeon: Mavis Purchase, MD;  Location: [facility] OR;  Service: Neurosurgery;  Laterality: N/A;  VP SHUNT Damien Blumenthal, M.A., CCC-SLP Speech Language Pathology, Acute Rehabilitation Services Secure Chat preferred [phone] 07/28/2024, 3:22 PM   carvedilol   25 mg Per Tube BID WC   Chlorhexidine  Gluconate Cloth  6 each Topical Q0600   Chlorhexidine  Gluconate Cloth  6 each Topical Q0600   cinacalcet   30 mg Oral Q M,W,F   cycloSPORINE   200 mg Per Tube QPM   cycloSPORINE   225 mg Per Tube Daily   darbepoetin (ARANESP ) injection - DIALYSIS  200 mcg Subcutaneous Q Thu-1800   famotidine   10 mg Per Tube QHS   feeding supplement (NEPRO CARB STEADY)  1,000 mL Per Tube Q24H   fiber supplement (BANATROL TF)  60 mL Per Tube TID   gabapentin   100 mg Per Tube Daily   Gerhardt's butt cream   Topical BID   hydrALAZINE   25 mg Oral Q8H   lacosamide   100 mg Per Tube BID   liver oil-zinc  oxide   Topical TID   losartan   25 mg Per Tube Q1200   multivitamin with minerals  1 tablet Per Tube Daily   mycophenolate   500 mg Per Tube BID   mouth rinse  15 mL Mouth Rinse 4 times per day   oxidized cellulose  1 each Topical Once   pantoprazole  (PROTONIX ) IV  40 mg Intravenous Daily   predniSONE   15 mg Per Tube Q breakfast    BMET    Component Value Date/Time   NA 128 (L) 07/29/2024 0730   K 4.3 07/29/2024 0730   CL 88 (L) 07/29/2024 0730   CO2 24 07/29/2024 0730   GLUCOSE 105 (H) 07/29/2024 0730   BUN 50 (H) 07/29/2024 0730   CREATININE 3.59 (H) 07/29/2024 0730   CALCIUM  10.3 07/29/2024 0730   GFRNONAA 16 (L) 07/29/2024 0730   GFRAA   02/02/2022 9167    PATIENT IDENTIFICATION ERROR. PLEASE DISREGARD RESULTS. ACCOUNT WILL BE CREDITED.   CBC    Component Value Date/Time   WBC 3.7 (L) 07/29/2024 0730   RBC 2.67 (L) 07/29/2024 0730   HGB 8.4 (L) 07/29/2024 0730   HCT 26.2 (L) 07/29/2024 0730   PLT 164 07/29/2024 0730   MCV 98.1 07/29/2024 0730   MCH 31.5 07/29/2024 0730   MCHC 32.1 07/29/2024 0730   RDW 16.0 (H) 07/29/2024 0730   LYMPHSABS 0.9 06/24/2024 0827   MONOABS 0.5 06/24/2024 0827   EOSABS 0.1 06/24/2024 0827   BASOSABS 0.0 06/24/2024 0827    Dialysis Orders: 3:45hr, 400/A1.5, EDW 55.2kg, 2K/2Ca bath, AVF, no heparin  - Prev on Mircera 150mcg IV q 2 weeks - No VDRA   Assessment/Plan: Acute R intracranial hemorrhage: 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 Needs ongoing rehab. Doing better.  ESRD:  HD on MWF.  Due to Thanksgiving Holiday, will have dialysis on Sunday, Tuesday and Friday this week.  HTN/volume: BP trending back up- has had some hypotension with HD. UF as tolerated. Off amlodipine , now on Coreg  25mg  bid, Losartan  25 mg. Has been tapered off clonidine .   Hydralazine  25mg  TID started by PMD- may need to decrease if she has low BP on HD Anemia of ESRD: Hgb 8s, transfuse prn. Continue Aranesp  200 q Thurs Secondary HPTH: CorrCa  high, phos in goal. Continue sensipar , not on VDRA. Nutrition: Alb low, Remains NPO - to complete MBS to objectively evaluate her swallow. continue Nepro TF.  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 Dispo: Insurance declined LTAC, and SNF, appeal pending. Tolerated HD in recliner on multiple days  Fairy RONAL Sellar, MD [facility]

## 2024-07-30 NOTE — TOC Progression Note (Signed)
 Transition of Care [facility]) - Progression Note    Patient Details  Name: Melenie Minniear MRN: [id number] Date of Birth: 02-21-1981  Transition of Care [facility]) CM/SW Contact  [name], [name] Phone Number: 07/30/2024, 3:43 PM  Clinical Narrative:   Patient received bed offer from [facility]. CSW met with patient's mother earlier today to provide update, she asked for time to think about it. CSW met with her later, she is in agreement. CSW contacted CMA to request insurance authorization. CSW to follow.    Expected Discharge Plan: Skilled Nursing Facility Barriers to Discharge: No SNF bed, Insurance Authorization               Expected Discharge Plan and Services In-house Referral: Clinical Social Work   Post Acute Care Choice: Skilled Nursing Facility Living arrangements for the past 2 months: Apartment                                       Social Drivers of Health (SDOH) Interventions SDOH Screenings   Food Insecurity: Patient Unable To Answer (05/20/2024)  Housing: Unknown (07/14/2024)  Transportation Needs: Patient Unable To Answer (05/20/2024)  Utilities: Patient Unable To Answer (05/20/2024)  Social Connections: Unknown (10/04/2022)   Received from [facility]  Stress: No Stress Concern Present (04/16/2024)   Received from [facility]  Tobacco Use: Medium Risk (06/18/2024)    Readmission Risk Interventions    05/20/2024    4:16 PM 03/31/2024   11:27 AM 11/17/2021   12:15 PM  Readmission Risk Prevention Plan  Transportation Screening Complete Complete Complete  Medication Review Oceanographer) Complete Complete Complete  PCP or Specialist appointment within 3-5 days of discharge Complete  Complete  HRI or Home Care Consult Complete Complete Complete  SW Recovery Care/Counseling Consult Complete Complete Complete  Palliative Care Screening Complete Not Applicable Not Applicable  Skilled Nursing Facility Complete Not Applicable  Patient Refused

## 2024-07-30 NOTE — Plan of Care (Signed)
 Problem: Education: Goal: Ability to describe self-care measures that may prevent or decrease complications (Diabetes Survival Skills Education) will improve Outcome: Progressing Goal: Individualized Educational Video(s) Outcome: Progressing   Problem: Coping: Goal: Ability to adjust to condition or change in health will improve Outcome: Progressing   Problem: Fluid Volume: Goal: Ability to maintain a balanced intake and output will improve Outcome: Progressing   Problem: Health Behavior/Discharge Planning: Goal: Ability to identify and utilize available resources and services will improve Outcome: Progressing Goal: Ability to manage health-related needs will improve Outcome: Progressing   Problem: Metabolic: Goal: Ability to maintain appropriate glucose levels will improve Outcome: Progressing   Problem: Nutritional: Goal: Maintenance of adequate nutrition will improve Outcome: Progressing Goal: Progress toward achieving an optimal weight will improve Outcome: Progressing   Problem: Skin Integrity: Goal: Risk for impaired skin integrity will decrease Outcome: Progressing   Problem: Tissue Perfusion: Goal: Adequacy of tissue perfusion will improve Outcome: Progressing   Problem: Education: Goal: Knowledge of disease or condition will improve Outcome: Progressing Goal: Knowledge of secondary prevention will improve (MUST DOCUMENT ALL) Outcome: Progressing Goal: Knowledge of patient specific risk factors will improve (DELETE if not current risk factor) Outcome: Progressing   Problem: Intracerebral Hemorrhage Tissue Perfusion: Goal: Complications of Intracerebral Hemorrhage will be minimized Outcome: Progressing   Problem: Coping: Goal: Will verbalize positive feelings about self Outcome: Progressing Goal: Will identify appropriate support needs Outcome: Progressing   Problem: Health Behavior/Discharge Planning: Goal: Ability to manage health-related needs will  improve Outcome: Progressing Goal: Goals will be collaboratively established with patient/family Outcome: Progressing   Problem: Self-Care: Goal: Ability to participate in self-care as condition permits will improve Outcome: Progressing Goal: Verbalization of feelings and concerns over difficulty with self-care will improve Outcome: Progressing Goal: Ability to communicate needs accurately will improve Outcome: Progressing   Problem: Nutrition: Goal: Risk of aspiration will decrease Outcome: Progressing Goal: Dietary intake will improve Outcome: Progressing   Problem: Education: Goal: Knowledge of General Education information will improve Description: Including pain rating scale, medication(s)/side effects and non-pharmacologic comfort measures Outcome: Progressing   Problem: Health Behavior/Discharge Planning: Goal: Ability to manage health-related needs will improve Outcome: Progressing   Problem: Clinical Measurements: Goal: Ability to maintain clinical measurements within normal limits will improve Outcome: Progressing Goal: Will remain free from infection Outcome: Progressing Goal: Diagnostic test results will improve Outcome: Progressing Goal: Respiratory complications will improve Outcome: Progressing Goal: Cardiovascular complication will be avoided Outcome: Progressing   Problem: Activity: Goal: Risk for activity intolerance will decrease Outcome: Progressing   Problem: Nutrition: Goal: Adequate nutrition will be maintained Outcome: Progressing   Problem: Coping: Goal: Level of anxiety will decrease Outcome: Progressing   Problem: Elimination: Goal: Will not experience complications related to bowel motility Outcome: Progressing   Problem: Pain Managment: Goal: General experience of comfort will improve and/or be controlled Outcome: Progressing   Problem: Safety: Goal: Ability to remain free from injury will improve Outcome: Progressing    Problem: Skin Integrity: Goal: Risk for impaired skin integrity will decrease Outcome: Progressing   Problem: Education: Goal: Knowledge of the prescribed therapeutic regimen will improve Outcome: Progressing   Problem: Clinical Measurements: Goal: Usual level of consciousness will be regained or maintained. Outcome: Progressing Goal: Neurologic status will improve Outcome: Progressing Goal: Ability to maintain intracranial pressure will improve Outcome: Progressing   Problem: Skin Integrity: Goal: Demonstration of wound healing without infection will improve Outcome: Progressing   Problem: Education: Goal: Knowledge of disease and its progression will improve Outcome: Progressing  Goal: Individualized Educational Video(s) Outcome: Progressing   Problem: Fluid Volume: Goal: Compliance with measures to maintain balanced fluid volume will improve Outcome: Progressing

## 2024-07-31 DIAGNOSIS — I619 Nontraumatic intracerebral hemorrhage, unspecified: Secondary | ICD-10-CM | POA: Diagnosis not present

## 2024-07-31 LAB — GLUCOSE, CAPILLARY
Glucose-Capillary: 114 mg/dL — ABNORMAL HIGH (ref 70–99)
Glucose-Capillary: 133 mg/dL — ABNORMAL HIGH (ref 70–99)
Glucose-Capillary: 191 mg/dL — ABNORMAL HIGH (ref 70–99)

## 2024-07-31 NOTE — Plan of Care (Signed)
   Problem: Education: Goal: Ability to describe self-care measures that may prevent or decrease complications (Diabetes Survival Skills Education) will improve Outcome: Progressing   Problem: Coping: Goal: Ability to adjust to condition or change in health will improve Outcome: Progressing   Problem: Fluid Volume: Goal: Ability to maintain a balanced intake and output will improve Outcome: Progressing

## 2024-07-31 NOTE — Plan of Care (Signed)
  Problem: Education: Goal: Ability to describe self-care measures that may prevent or decrease complications (Diabetes Survival Skills Education) will improve 07/31/2024 0628 by Gaetana Randall Mathew GORMAN, RN Outcome: Progressing 07/31/2024 0514 by Gaetana Randall Mathew GORMAN, RN Outcome: Progressing Goal: Individualized Educational Video(s) 07/31/2024 0628 by Gaetana Randall Mathew GORMAN, RN Outcome: Progressing 07/31/2024 0514 by Gaetana Randall Mathew GORMAN, RN Outcome: Progressing   Problem: Fluid Volume: Goal: Ability to maintain a balanced intake and output will improve 07/31/2024 0628 by Gaetana Randall Mathew GORMAN, RN Outcome: Progressing 07/31/2024 0514 by Gaetana Randall Mathew GORMAN, RN Outcome: Progressing   Problem: Metabolic: Goal: Ability to maintain appropriate glucose levels will improve 07/31/2024 0628 by Gaetana Randall Mathew GORMAN, RN Outcome: Progressing 07/31/2024 0514 by Gaetana Randall Mathew GORMAN, RN Outcome: Progressing   Problem: Tissue Perfusion: Goal: Adequacy of tissue perfusion will improve 07/31/2024 0628 by Gaetana Randall Mathew GORMAN, RN Outcome: Progressing 07/31/2024 0514 by Gaetana Randall Mathew GORMAN, RN Outcome: Progressing   Problem: Intracerebral Hemorrhage Tissue Perfusion: Goal: Complications of Intracerebral Hemorrhage will be minimized 07/31/2024 0628 by Gaetana Randall Mathew GORMAN, RN Outcome: Progressing 07/31/2024 0514 by Gaetana Randall Mathew GORMAN, RN Outcome: Progressing

## 2024-07-31 NOTE — Progress Notes (Signed)
 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  Patient was noted to have hemorrhagic stroke.  Neurology, neurosurgery in critical care were consulted.  EVD was placed on the left side and was initially admitted to neuro ICU.  She was also intubated and had prolonged intubation.  Subsequently, patient was extubated on 05/27/2024.  Important events:  9/15 admitted with ICH plus IVH, EVD was placed for hydrocephalus, intubated 9/22 palliative care meeting.  Family is insistent that they want full scope of care 9/23 Extubated 10/1 -developed fevers pancultures drawn and broad-spectrum antibiotics started 10/2 became afebrile after starting antibiotics, did not tolerate raising EVD 10/6 EVD was raised from 10 to 30 cm water , tolerating well, remained afebrile 10/7 EVD is at 30 cm water , CT scan will be done tomorrow morning, no overnight issues 10/8 she became somnolent, CT head showed increasing hydrocephalus, EVD was titrated down to 10 cm of water  with improvement in mental status 10/15 VP shunt placement 10/17 transferred to progressive unit with [name]  11/18-medically stable.  Awaiting for disposition.   Assessment/Plan: Principal Problem:   Hemorrhagic stroke [facility]) Active Problems:   Moderate protein-calorie malnutrition   Pressure injury of skin   Nontraumatic subcortical hemorrhage of right cerebral hemisphere [facility])  Acute right parietotemporal intraparenchymal hemorrhage / Intraventricular hemorrhage: Uncontrolled hypertension: Obstructive hydrocephalus s/p EVD  changed to VP shunt: Seizure disorder S/p VP shunt placement on 06/18/2024 by Dr. Mavis.  Continue Vimpat .  Neurosurgery intermittently followed the patient during hospitalization.  Currently stable   ESRD on dialysis: History of failed renal transplant: Secondary hyperparathyroidism: Nephrology on board.  Continue CellCept  and prednisone .  On Monday, Wednesday, Friday hemodialysis schedule.  Will need outpatient hemodialysis set up  Acute bronchitis with fever: Resolved   History of pancreatic/kidney transplantation:  Continue cyclosporine , mycophenolate , and prednisone .   Hypertension:  Currently on Coreg , amlodipine  and clonidine  on nondialysis days.  On hydralazine  25 milligram 3 times daily.  Monitor blood pressure and adjust as necessary   Anemia of chronic disease:  Latest hemoglobin  8.4.Transfuse if Hb below 7.0   Type 1 diabetes with vasculopathy/gastroparesis:  Not on long-term insulin  anymore due to pancreatic transplant. On sliding scale at this time.   Moderate protein calorie malnutrition/dysphagia:  On PEG tube feeding.  Patient has been advanced on regular diet as well  Stage I sacral pressure ulcer present on admission: Continue wound care   Debility/deconditioning/disposition/goals of care:  Palliative care had seen the patient during hospitalization.  Family has insisted full code.   Palliative care has signed off 10/17.  Disposition pending.  TOC on board.    DVT prophylaxis: SCDs Start: 05/19/24 1724   Disposition: Skilled nursing facility status is: Inpatient  Remains inpatient appropriate because: Awaiting for disposition to skilled nursing facility,    Code Status:     Code Status: Full Code  Family Communication:  Spoke with the patient's mother at bedside 07/31/2024  Consultants: Nephrology Neurosurgery  Procedures: Ventriculoperitoneal shunt placement Hemodialysis  Anti-infectives:  None currently   Subjective: Today, patient  was seen and examined at bedside.  Patient trying to eat some.  Denies any  pain, nausea, vomiting, shortness of breath or dyspnea.   Vitals:   07/31/24 0408 07/31/24 0813  BP: (!) 160/67 (!) 162/57  Pulse: 75 80  Resp: 16 16  Temp: 97.9 F (36.6 C) 98.4 F (36.9 C)  SpO2: 100% 97%   No intake or output data in the 24 hours ending 07/31/24 1027   Filed Weights   07/29/24 1148 07/30/24 0500 07/31/24 0500  Weight: 55.3 kg 55.2 kg 54.2 kg   Body mass index is 21.17 kg/m.   Physical Exam:  General:  Average built, not in obvious distress, chronically ill and deconditioned, Communicative HENT:   No scleral pallor or icterus noted. Oral mucosa is moist.  Right-sided scalp with scar. Chest:  Clear breath sounds.  Diminished breath sounds bilaterally. .  CVS: S1 &S2 heard. No murmur.  Regular rate and rhythm. Abdomen: Soft, nontender, nondistended.  Bowel sounds are heard.  PEG tube in place. Extremities: No cyanosis, clubbing, right upper extremity AV fistula in place.  Peripheral pulses are palpable. Psych: Alert, awake and oriented, normal mood CNS:  No cranial nerve deficits.  Weakness of the extremities. Skin: Warm and dry.  No rashes noted.   Data Review: I have personally reviewed the following laboratory data and studies,  CBC: Recent Labs  Lab 07/25/24 0841 07/27/24 1108 07/29/24 0730  WBC 3.5* 3.7* 3.7*  HGB 8.5* 8.5* 8.4*  HCT 25.9* 26.2* 26.2*  MCV 97.4 97.8 98.1  PLT 181 169 164   Basic Metabolic Panel: Recent Labs  Lab 07/25/24 0131 07/27/24 1108 07/29/24 0730  NA 127* 128* 128*  K 4.5 4.2 4.3  CL 87* 84* 88*  CO2 27 28 24   GLUCOSE 110* 125* 105*  BUN 51* 54* 50*  CREATININE 3.54* 3.99* 3.59*  CALCIUM  10.5* 10.4* 10.3  PHOS 3.8 3.7 3.7   Liver Function Tests: Recent Labs  Lab 07/25/24 0131 07/27/24 1108 07/29/24 0730  ALBUMIN  3.1* 3.0* 2.8*   No results for input(s): LIPASE, AMYLASE in the last 168 hours. No results for input(s):  AMMONIA in the last 168 hours. Cardiac Enzymes: No results for input(s): CKTOTAL, CKMB, CKMBINDEX, TROPONINI in the last 168 hours. BNP (last 3 results) Recent Labs    02/14/24 0949  BNP 1,486.6*    ProBNP (last 3 results) No results for input(s): PROBNP in the last 8760 hours.  CBG: Recent Labs  Lab 07/30/24 0755 07/30/24 1545 07/30/24 2118 07/31/24 0039 07/31/24 0624  GLUCAP 88 183* 142* 133* 114*   No results found for this or any previous visit (from the past 240 hours).   Studies: No results found.     Vernal Alstrom, MD  [name] 07/31/2024  If 7PM-7AM, please contact night-coverage

## 2024-07-31 NOTE — Progress Notes (Signed)
 Patient ID: [name], female   DOB: 04/13/1981, 43 y.o.   MRN: [id number] S: No new complaints or events overnight. O:BP (!) 162/57 (BP Location: Left Arm)   Pulse 80   Temp 98.4 F (36.9 C)   Resp 16   Ht 5' 3 (1.6 m)   Wt 54.2 kg   SpO2 97%   BMI 21.17 kg/m  No intake or output data in the 24 hours ending 07/31/24 0828 Intake/Output: No intake/output data recorded.  Intake/Output this shift:  No intake/output data recorded. Weight change: -1.1 kg Gen: NAD CVS: RRR Resp: CTA Abd: +BS, soft, NT/ND Ext: no edema, RUE AVF +T/B  Recent Labs  Lab 07/25/24 0131 07/27/24 1108 07/29/24 0730  NA 127* 128* 128*  K 4.5 4.2 4.3  CL 87* 84* 88*  CO2 27 28 24   GLUCOSE 110* 125* 105*  BUN 51* 54* 50*  CREATININE 3.54* 3.99* 3.59*  ALBUMIN  3.1* 3.0* 2.8*  CALCIUM  10.5* 10.4* 10.3  PHOS 3.8 3.7 3.7   Liver Function Tests: Recent Labs  Lab 07/25/24 0131 07/27/24 1108 07/29/24 0730  ALBUMIN  3.1* 3.0* 2.8*   No results for input(s): LIPASE, AMYLASE in the last 168 hours. No results for input(s): AMMONIA in the last 168 hours. CBC: Recent Labs  Lab 07/25/24 0841 07/27/24 1108 07/29/24 0730  WBC 3.5* 3.7* 3.7*  HGB 8.5* 8.5* 8.4*  HCT 25.9* 26.2* 26.2*  MCV 97.4 97.8 98.1  PLT 181 169 164   Cardiac Enzymes: No results for input(s): CKTOTAL, CKMB, CKMBINDEX, TROPONINI in the last 168 hours. CBG: Recent Labs  Lab 07/30/24 0755 07/30/24 1545 07/30/24 2118 07/31/24 0039 07/31/24 0624  GLUCAP 88 183* 142* 133* 114*    Iron  Studies: No results for input(s): IRON , TIBC, TRANSFERRIN, FERRITIN in the last 72 hours. Studies/Results: No results found.  carvedilol   25 mg Per Tube BID WC   Chlorhexidine  Gluconate Cloth  6 each Topical Q0600   Chlorhexidine  Gluconate Cloth  6 each Topical Q0600   cinacalcet   30 mg Oral Q M,W,F   cycloSPORINE   200 mg Per Tube QPM   cycloSPORINE   225 mg Per Tube Daily   darbepoetin (ARANESP ) injection -  DIALYSIS  200 mcg Subcutaneous Q Thu-1800   famotidine   10 mg Per Tube QHS   feeding supplement (NEPRO CARB STEADY)  1,000 mL Per Tube Q24H   fiber supplement (BANATROL TF)  60 mL Per Tube TID   gabapentin   100 mg Per Tube Daily   Gerhardt's butt cream   Topical BID   hydrALAZINE   25 mg Oral Q8H   lacosamide   100 mg Per Tube BID   liver oil-zinc  oxide   Topical TID   losartan   25 mg Per Tube Q1200   multivitamin with minerals  1 tablet Per Tube Daily   mycophenolate   500 mg Per Tube BID   mouth rinse  15 mL Mouth Rinse 4 times per day   oxidized cellulose  1 each Topical Once   pantoprazole  (PROTONIX ) IV  40 mg Intravenous Daily   predniSONE   15 mg Per Tube Q breakfast    BMET    Component Value Date/Time   NA 128 (L) 07/29/2024 0730   K 4.3 07/29/2024 0730   CL 88 (L) 07/29/2024 0730   CO2 24 07/29/2024 0730   GLUCOSE 105 (H) 07/29/2024 0730   BUN 50 (H) 07/29/2024 0730   CREATININE 3.59 (H) 07/29/2024 0730   CALCIUM  10.3 07/29/2024 0730   GFRNONAA 16 (  L) 07/29/2024 0730   GFRAA  02/02/2022 9167    PATIENT IDENTIFICATION ERROR. PLEASE DISREGARD RESULTS. ACCOUNT WILL BE CREDITED.   CBC    Component Value Date/Time   WBC 3.7 (L) 07/29/2024 0730   RBC 2.67 (L) 07/29/2024 0730   HGB 8.4 (L) 07/29/2024 0730   HCT 26.2 (L) 07/29/2024 0730   PLT 164 07/29/2024 0730   MCV 98.1 07/29/2024 0730   MCH 31.5 07/29/2024 0730   MCHC 32.1 07/29/2024 0730   RDW 16.0 (H) 07/29/2024 0730   LYMPHSABS 0.9 06/24/2024 0827   MONOABS 0.5 06/24/2024 0827   EOSABS 0.1 06/24/2024 0827   BASOSABS 0.0 06/24/2024 0827    Dialysis Orders: 3:45hr, 400/A1.5, EDW 55.2kg, 2K/2Ca bath, AVF, no heparin  - Prev on Mircera 150mcg IV q 2 weeks - No VDRA   Assessment/Plan: Acute R intracranial hemorrhage: 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 Needs ongoing rehab. Doing better.  ESRD:  HD on MWF.  Due to Thanksgiving Holiday, will have dialysis on Sunday, Tuesday and Friday this week.   HTN/volume: BP trending back up- has had some hypotension with HD. UF as tolerated. Off amlodipine , now on Coreg  25mg  bid, Losartan  25 mg. Has been tapered off clonidine .   Hydralazine  25mg  TID started by PMD- may need to decrease if she has low BP on HD Anemia of ESRD: Hgb 8s, transfuse prn. Continue Aranesp  200 q Thurs Secondary HPTH: CorrCa high, phos in goal. Continue sensipar , not on VDRA. Nutrition: Alb low, Remains NPO - to complete MBS to objectively evaluate her swallow. continue Nepro TF.  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 Dispo: Insurance declined LTAC, and SNF, appeal pending. Tolerated HD in recliner on multiple days  Fairy RONAL Sellar, MD [facility]

## 2024-07-31 NOTE — Plan of Care (Signed)
  Problem: Health Behavior/Discharge Planning: Goal: Ability to identify and utilize available resources and services will improve Outcome: Progressing Goal: Ability to manage health-related needs will improve Outcome: Progressing   Problem: Nutritional: Goal: Maintenance of adequate nutrition will improve Outcome: Progressing Goal: Progress toward achieving an optimal weight will improve Outcome: Progressing   Problem: Skin Integrity: Goal: Risk for impaired skin integrity will decrease Outcome: Progressing   Problem: Clinical Measurements: Goal: Ability to maintain clinical measurements within normal limits will improve Outcome: Progressing Goal: Will remain free from infection Outcome: Progressing Goal: Diagnostic test results will improve Outcome: Progressing Goal: Respiratory complications will improve Outcome: Progressing Goal: Cardiovascular complication will be avoided Outcome: Progressing   Problem: Activity: Goal: Risk for activity intolerance will decrease Outcome: Progressing   Problem: Elimination: Goal: Will not experience complications related to bowel motility Outcome: Progressing   Problem: Pain Managment: Goal: General experience of comfort will improve and/or be controlled Outcome: Progressing   Problem: Safety: Goal: Ability to remain free from injury will improve Outcome: Progressing

## 2024-08-01 DIAGNOSIS — I619 Nontraumatic intracerebral hemorrhage, unspecified: Secondary | ICD-10-CM | POA: Diagnosis not present

## 2024-08-01 LAB — GLUCOSE, CAPILLARY
Glucose-Capillary: 123 mg/dL — ABNORMAL HIGH (ref 70–99)
Glucose-Capillary: 110 mg/dL — ABNORMAL HIGH (ref 70–99)
Glucose-Capillary: 162 mg/dL — ABNORMAL HIGH (ref 70–99)

## 2024-08-01 LAB — CBC
HCT: 24.2 % — ABNORMAL LOW (ref 36.0–46.0)
Hemoglobin: 7.8 g/dL — ABNORMAL LOW (ref 12.0–15.0)
MCH: 32 pg (ref 26.0–34.0)
MCHC: 32.2 g/dL (ref 30.0–36.0)
MCV: 99.2 fL (ref 80.0–100.0)
Platelets: 154 K/uL (ref 150–400)
RBC: 2.44 MIL/uL — ABNORMAL LOW (ref 3.87–5.11)
RDW: 15.7 % — ABNORMAL HIGH (ref 11.5–15.5)
WBC: 4 K/uL (ref 4.0–10.5)
nRBC: 0 % (ref 0.0–0.2)

## 2024-08-01 MED ORDER — HYDROCORTISONE (PERIANAL) 2.5 % EX CREA
TOPICAL_CREAM | Freq: Three times a day (TID) | CUTANEOUS | Status: DC
  Administered 2024-08-06: 1 via RECTAL
  Filled 2024-08-01: qty 28.35

## 2024-08-01 MED ORDER — ACETAMINOPHEN 325 MG PO TABS
ORAL_TABLET | ORAL | Status: AC
  Filled 2024-08-01: qty 2

## 2024-08-01 NOTE — TOC Progression Note (Addendum)
 Transition of Care [facility]) - Progression Note    Patient Details  Name: Analya Louissaint MRN: [id number] Date of Birth: 1981-04-13  Transition of Care [facility]) CM/SW Contact  Demere Dotzler [place], [name] Phone Number: 08/01/2024, 1:36 PM  Clinical Narrative:  Spoke to [facility] CM Megan re status of SNF auth for [facility]. Per [place], there was still an open appeal for [facility] when the auth request for [facility] was submitted. The appeal was successful and denial overturned for [facility] so they will update the facility to [facility]. Hopefully auth details will be available today for [facility]. Spoke to Kia in [facility] admissions who confirmed they are prepared to admit pt once auth received including over weekend.  Will provide updates as available.   1450: Call received from Kia with [facility] admissions who reports they are no longer able to offer a bed as pt will need LTC and they do not have availability. Reached out to Tammy with [facility] who reports they are still considering for [facility] and [facility] but unable to accept if pt's mom is adamant about staying  with the pt 24/7 and they are unable to provide a private room if pt becomes LTC. Pt's mom updated.   Julien Das, MSW, LCSW [phone] (coverage)      Expected Discharge Plan: Skilled Nursing Facility Barriers to Discharge: No SNF bed, Insurance Authorization               Expected Discharge Plan and Services In-house Referral: Clinical Social Work   Post Acute Care Choice: Skilled Nursing Facility Living arrangements for the past 2 months: Apartment                                       Social Drivers of Health (SDOH) Interventions SDOH Screenings   Food Insecurity: Patient Unable To Answer (05/20/2024)  Housing: Unknown (07/14/2024)  Transportation Needs: Patient Unable To Answer (05/20/2024)   Utilities: Patient Unable To Answer (05/20/2024)  Social Connections: Unknown (10/04/2022)   Received from [facility]  Stress: No Stress Concern Present (04/16/2024)   Received from [facility]  Tobacco Use: Medium Risk (06/18/2024)    Readmission Risk Interventions    05/20/2024    4:16 PM 03/31/2024   11:27 AM 11/17/2021   12:15 PM  Readmission Risk Prevention Plan  Transportation Screening Complete Complete Complete  Medication Review Oceanographer) Complete Complete Complete  PCP or Specialist appointment within 3-5 days of discharge Complete  Complete  HRI or Home Care Consult Complete Complete Complete  SW Recovery Care/Counseling Consult Complete Complete Complete  Palliative Care Screening Complete Not Applicable Not Applicable  Skilled Nursing Facility Complete Not Applicable Patient Refused

## 2024-08-01 NOTE — Progress Notes (Signed)
 Calorie Count Note  48-hour calorie count ordered.  Pt out of room for HD at the time of assessment. Mom is room able to discuss intake since Wednesday afternoon. Three meals documented in kcal count envelope reviewed dinning records for additional information. Some documentation difficult to determine portion sizes as it was completed by family members which may affect the accuracy. At this time, do not recommend changes to tube feed regimen but will redo kcal count periodically to determine if changes are needed. Noted that pt has authorization pending for SNF.  Diet: Regular, thin liquids  Estimated Nutritional Needs:  Kcal:  1700-1900 Protein:  80-100 grams Fluid:  1L +UOP  11/26 Lunch: 230 kcal, 17g protein 11/26 Dinner: no intake 11/27 Breakfast: Did not order meal 11/27 Lunch: Did not order meal, ate floor stock items, 80kcal 4g protein 11/27 Dinner: 181 kcal, 12g protein 11/28 Breakfast: did not order meal off floor for HD 11/28 lunch: did not order meal  Total intake x 2.5 days: 491 kcal, 33 protein, insufficient to make adjustments to needs.  NUTRITION DIAGNOSIS:  Moderate Malnutrition related to chronic illness (ESRD on HD/DM and gastroparesis) as evidenced by severe muscle depletion, mild fat depletion. - remains applicable   GOAL:  Patient will meet greater than or equal to 90% of their needs - progressing  INTERVENTION:  Continue tube feeding via G-J tube: Nepro at 45 ml/h (1080 ml per day) Provides 1912 kcal, 87 gm protein, 785 ml free water  daily Banatrol TID  MVI daily   Vernell Lukes, RD, LDN, CNSC Registered Dietitian II Please reach out via secure chat

## 2024-08-01 NOTE — Plan of Care (Signed)
  Problem: Education: Goal: Ability to describe self-care measures that may prevent or decrease complications (Diabetes Survival Skills Education) will improve Outcome: Progressing   Problem: Tissue Perfusion: Goal: Adequacy of tissue perfusion will improve Outcome: Progressing

## 2024-08-01 NOTE — Progress Notes (Signed)
 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  Patient was noted to have hemorrhagic stroke.  Neurology, neurosurgery in critical care were consulted.  EVD was placed on the left side and was initially admitted to neuro ICU.  She was also intubated and had prolonged intubation.  Subsequently, patient was extubated on 05/27/2024.  Important events:  9/15 admitted with ICH plus IVH, EVD was placed for hydrocephalus, intubated 9/22 palliative care meeting.  Family is insistent that they want full scope of care 9/23 Extubated 10/1 -developed fevers pancultures drawn and broad-spectrum antibiotics started 10/2 became afebrile after starting antibiotics, did not tolerate raising EVD 10/6 EVD was raised from 10 to 30 cm water , tolerating well, remained afebrile 10/7 EVD is at 30 cm water , CT scan will be done tomorrow morning, no overnight issues 10/8 she became somnolent, CT head showed increasing hydrocephalus, EVD was titrated down to 10 cm of water  with improvement in mental status 10/15 VP shunt placement 10/17 transferred to progressive unit with [name]  11/18-medically stable.  Awaiting for disposition.   Assessment/Plan: Principal Problem:   Hemorrhagic stroke [facility]) Active Problems:   Moderate protein-calorie malnutrition   Pressure injury of skin   Nontraumatic subcortical hemorrhage of right cerebral hemisphere [facility])  Acute right parietotemporal intraparenchymal hemorrhage / Intraventricular hemorrhage: Uncontrolled hypertension: Obstructive hydrocephalus s/p EVD  changed to VP shunt: Seizure disorder S/p VP shunt placement on 06/18/2024 by Dr. Mavis.  Continue Vimpat .  Neurosurgery intermittently followed the patient during hospitalization.  Currently stable   ESRD on dialysis: History of failed renal transplant: Secondary hyperparathyroidism: Nephrology on board.  Continue CellCept  and prednisone .  On Monday, Wednesday, Friday hemodialysis schedule.  Will need outpatient hemodialysis set up  Acute bronchitis with fever: Resolved   History of pancreatic/kidney transplantation:  Continue cyclosporine , mycophenolate , and prednisone .   Hypertension:  Currently on Coreg , amlodipine  and clonidine  on nondialysis days.  On hydralazine  25 milligram 3 times daily.  Monitor blood pressure and adjust as necessary   Anemia of chronic disease:  Latest hemoglobin  8.4.Transfuse if Hb below 7.0   Type 1 diabetes with vasculopathy/gastroparesis:  Not on long-term insulin  anymore due to pancreatic transplant. On sliding scale at this time.   Moderate protein calorie malnutrition/dysphagia:  On PEG tube feeding.  Patient has been advanced on regular diet as well  Stage I sacral pressure ulcer present on admission: Continue wound care   Debility/deconditioning/disposition/goals of care:  Palliative care had seen the patient during hospitalization.  Family has insisted full code.   Palliative care has signed off 10/17.  Disposition pending.  TOC on board.    DVT prophylaxis: SCDs Start: 05/19/24 1724   Disposition: Skilled nursing facility.  Medically stable for disposition status is: Inpatient  Remains inpatient appropriate because: Awaiting for disposition     Code Status:     Code Status: Full Code  Family Communication:  Spoke with the patient's mother at bedside 07/31/2024  Consultants: Nephrology Neurosurgery  Procedures: Ventriculoperitoneal shunt placement Hemodialysis  Anti-infectives:  None currently   Subjective: Today,  patient was seen and examined at bedside during hemodialysis.  Denies interval complaints.  No nausea vomiting abdominal pain.  Trying her best to eat.  Denies shortness of breath or dyspnea  Vitals:   08/01/24 0821 08/01/24 0830  BP: (!) 130/52 (!) 124/59  Pulse: 70 72  Resp: 15 (!) 24  Temp:    SpO2: 99% 100%    Intake/Output Summary (Last 24 hours) at 08/01/2024 0901 Last data filed at 08/01/2024 0348 Gross per 24 hour  Intake 1019.25 ml  Output --  Net 1019.25 ml     Filed Weights   07/31/24 0500 08/01/24 0417 08/01/24 0815  Weight: 54.2 kg 54.6 kg 56.6 kg   Body mass index is 22.1 kg/m.   Physical Exam:  General:  Average built, not in obvious distress, chronically ill and deconditioned, Communicative HENT:   No scleral pallor or icterus noted. Oral mucosa is moist.  Right-sided scalp with scar. Chest:  Clear breath sounds.  Diminished breath sounds bilaterally. .  CVS: S1 &S2 heard. No murmur.  Regular rate and rhythm. Abdomen: Soft, nontender, nondistended.  Bowel sounds are heard.  PEG tube in place. Extremities: No cyanosis, clubbing, right upper extremity AV fistula in place.  Peripheral pulses are palpable. Psych: Alert, awake and oriented, normal mood CNS:  No cranial nerve deficits.  Generalized weakness of the extremities mostly of the lower extremities. Skin: Warm and dry.  No rashes noted.   Data Review: I have personally reviewed the following laboratory data and studies,  CBC: Recent Labs  Lab 07/27/24 1108 07/29/24 0730  WBC 3.7* 3.7*  HGB 8.5* 8.4*  HCT 26.2* 26.2*  MCV 97.8 98.1  PLT 169 164   Basic Metabolic Panel: Recent Labs  Lab 07/27/24 1108 07/29/24 0730  NA 128* 128*  K 4.2 4.3  CL 84* 88*  CO2 28 24  GLUCOSE 125* 105*  BUN 54* 50*  CREATININE 3.99* 3.59*  CALCIUM  10.4* 10.3  PHOS 3.7 3.7   Liver Function Tests: Recent Labs  Lab 07/27/24 1108 07/29/24 0730  ALBUMIN  3.0* 2.8*   No results for input(s): LIPASE,  AMYLASE in the last 168 hours. No results for input(s): AMMONIA in the last 168 hours. Cardiac Enzymes: No results for input(s): CKTOTAL, CKMB, CKMBINDEX, TROPONINI in the last 168 hours. BNP (last 3 results) Recent Labs    02/14/24 0949  BNP 1,486.6*    ProBNP (last 3 results) No results for input(s): PROBNP in the last 8760 hours.  CBG: Recent Labs  Lab 07/30/24 2118 07/31/24 0039 07/31/24 0624 07/31/24 1525 08/01/24 0625  GLUCAP 142* 133* 114* 191* 123*   No results found for this or any previous visit (from the past 240 hours).   Studies: No results found.     Vernal Alstrom, MD  [name] 08/01/2024  If 7PM-7AM, please contact night-coverage

## 2024-08-01 NOTE — Procedures (Signed)
 I was present at this dialysis session. I have reviewed the session itself and made appropriate changes.   Vital signs in last 24 hours:  Temp:  [97.5 F (36.4 C)-98.2 F (36.8 C)] 97.6 F (36.4 C) (11/28 0815) Pulse Rate:  [69-76] 70 (11/28 0821) Resp:  [15-17] 15 (11/28 0821) BP: (129-165)/(52-70) 130/52 (11/28 0821) SpO2:  [96 %-100 %] 99 % (11/28 0821) Weight:  [54.6 kg-56.6 kg] 56.6 kg (11/28 0815) Weight change: 0.4 kg Filed Weights   07/31/24 0500 08/01/24 0417 08/01/24 0815  Weight: 54.2 kg 54.6 kg 56.6 kg    Recent Labs  Lab 07/29/24 0730  NA 128*  K 4.3  CL 88*  CO2 24  GLUCOSE 105*  BUN 50*  CREATININE 3.59*  CALCIUM  10.3  PHOS 3.7    Recent Labs  Lab 07/25/24 0841 07/27/24 1108 07/29/24 0730  WBC 3.5* 3.7* 3.7*  HGB 8.5* 8.5* 8.4*  HCT 25.9* 26.2* 26.2*  MCV 97.4 97.8 98.1  PLT 181 169 164    Scheduled Meds:  carvedilol   25 mg Per Tube BID WC   Chlorhexidine  Gluconate Cloth  6 each Topical Q0600   Chlorhexidine  Gluconate Cloth  6 each Topical Q0600   cinacalcet   30 mg Oral Q M,W,F   cycloSPORINE   200 mg Per Tube QPM   cycloSPORINE   225 mg Per Tube Daily   darbepoetin (ARANESP ) injection - DIALYSIS  200 mcg Subcutaneous Q Thu-1800   famotidine   10 mg Per Tube QHS   feeding supplement (NEPRO CARB STEADY)  1,000 mL Per Tube Q24H   fiber supplement (BANATROL TF)  60 mL Per Tube TID   gabapentin   100 mg Per Tube Daily   Gerhardt's butt cream   Topical BID   hydrALAZINE   25 mg Oral Q8H   lacosamide   100 mg Per Tube BID   liver oil-zinc  oxide   Topical TID   losartan   25 mg Per Tube Q1200   multivitamin with minerals  1 tablet Per Tube Daily   mycophenolate   500 mg Per Tube BID   mouth rinse  15 mL Mouth Rinse 4 times per day   oxidized cellulose  1 each Topical Once   pantoprazole  (PROTONIX ) IV  40 mg Intravenous Daily   predniSONE   15 mg Per Tube Q breakfast   Continuous Infusions: PRN Meds:.acetaminophen  **OR** acetaminophen  (TYLENOL ) oral  liquid 160 mg/5 mL **OR** acetaminophen , camphor-menthol , diphenhydrAMINE , guaiFENesin -dextromethorphan , hydrALAZINE , labetalol , loperamide  HCl, melatonin, ondansetron  (ZOFRAN ) IV, ondansetron , mouth rinse, oxyCODONE , phenol, sennosides   Fairy DELENA Sellar,  MD 08/01/2024, 8:39 AM

## 2024-08-01 NOTE — Plan of Care (Signed)
  Problem: Health Behavior/Discharge Planning: Goal: Ability to identify and utilize available resources and services will improve Outcome: Progressing Goal: Ability to manage health-related needs will improve Outcome: Progressing   Problem: Skin Integrity: Goal: Risk for impaired skin integrity will decrease Outcome: Progressing   Problem: Nutrition: Goal: Risk of aspiration will decrease Outcome: Progressing Goal: Dietary intake will improve Outcome: Progressing   Problem: Clinical Measurements: Goal: Ability to maintain clinical measurements within normal limits will improve Outcome: Progressing Goal: Will remain free from infection Outcome: Progressing Goal: Diagnostic test results will improve Outcome: Progressing Goal: Respiratory complications will improve Outcome: Progressing Goal: Cardiovascular complication will be avoided Outcome: Progressing   Problem: Activity: Goal: Risk for activity intolerance will decrease Outcome: Progressing   Problem: Elimination: Goal: Will not experience complications related to bowel motility Outcome: Progressing   Problem: Pain Managment: Goal: General experience of comfort will improve and/or be controlled Outcome: Progressing   Problem: Safety: Goal: Ability to remain free from injury will improve Outcome: Progressing

## 2024-08-01 NOTE — Progress Notes (Signed)
 Physical Therapy Treatment Patient Details Name: [name] [name] MRN: [id number] DOB: 04/07/1981 Today's Date: 08/01/2024   History of Present Illness 43 yo female presents 05/19/24 for left gaze and L side weakness. 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 Intubated 9/15-9/23. EVD 9/15- 10/15; s/p shunt 10/15. S/p laparoscopy with intra-abdominal assistance ventricular-peritoneal shunt placement 10/15. 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 Pt reports increased fatigue due to HD earlier today, but demonstrates good motivation to progress mobility. Pt able to tolerate increased gait distance with seated rest breaks due, but continues to be limited by impaired activity tolerance. Reinforced education on importance of increasing time OOB in recliner and use of BSC instead of bedpan to increase endurance and strength with pt and pt's mother verbalizing understanding. Pt continues to benefit from PT services to progress toward functional mobility goals.    If plan is discharge home, recommend the following: A lot of help with walking and/or transfers;A lot of help with bathing/dressing/bathroom;Assistance with cooking/housework;Direct supervision/assist for medications management;Direct supervision/assist for financial management;Assist for transportation;Help with stairs or ramp for entrance   Can travel by private vehicle     No  Equipment Recommendations  Hospital bed;Hoyer lift;Wheelchair (measurements PT);Wheelchair cushion (measurements PT);BSC/3in1    Recommendations for Other Services       Precautions / Restrictions Precautions Precautions: Fall Recall of  Precautions/Restrictions: Impaired Precaution/Restrictions Comments: PEG Restrictions Weight Bearing Restrictions Per Provider Order: No     Mobility  Bed Mobility Overal bed mobility: Needs Assistance Bed Mobility: Supine to Sit     Supine to sit: HOB elevated, Used rails, Min assist     General bed mobility comments: Pt requesting assist due to fatigue. Min A for trunk elevation via HHA    Transfers Overall transfer level: Needs assistance Equipment used: Rolling walker (2 wheels) Transfers: Sit to/from Stand Sit to Stand: Min assist           General transfer comment: STS from EOB and recliner x4 with min A for rise and cues for hand placement each time    Ambulation/Gait Ambulation/Gait assistance: +2 safety/equipment, Contact guard assist Gait Distance (Feet): 10 Feet (+10, +20, +20) Assistive device: Rolling walker (2 wheels) Gait Pattern/deviations: Step-through pattern, Decreased stride length, Drifts right/left, Trunk flexed       General Gait Details: Pt demonstrates slow gait with flexed posture despite cues. 3 seated rest breaks due to quick fatigue   Stairs             Wheelchair Mobility     Tilt Bed    Modified Rankin (Stroke Patients Only) Modified Rankin (Stroke Patients Only) Pre-Morbid Rankin Score: Moderately severe disability Modified Rankin: Moderately severe disability     Balance Overall balance assessment: Needs assistance Sitting-balance support: No upper extremity supported, Feet supported Sitting balance-Leahy Scale: Good Sitting balance - Comments: seated EOB, no LOB   Standing balance support: Bilateral upper extremity supported, During functional activity, Reliant on assistive device for balance Standing balance-Leahy Scale: Poor Standing balance comment: reliant on RW support                            Communication Communication Communication: No apparent  difficulties  Cognition Arousal:  Alert Behavior During Therapy: WFL for tasks assessed/performed   PT - Cognitive impairments: Problem solving, Sequencing, Awareness                         Following commands: Impaired Following commands impaired: Follows one step commands with increased time, Follows multi-step commands inconsistently    Cueing Cueing Techniques: Verbal cues, Gestural cues, Visual cues  Exercises      General Comments        Pertinent Vitals/Pain Pain Assessment Pain Assessment: No/denies pain     PT Goals (current goals can now be found in the care plan section) Acute Rehab PT Goals Patient Stated Goal: unable to state goal PT Goal Formulation: Patient unable to participate in goal setting Time For Goal Achievement: 07/24/24 Progress towards PT goals: Progressing toward goals    Frequency    Min 2X/week       AM-PAC PT 6 Clicks Mobility   Outcome Measure  Help needed turning from your back to your side while in a flat bed without using bedrails?: A Little Help needed moving from lying on your back to sitting on the side of a flat bed without using bedrails?: A Little Help needed moving to and from a bed to a chair (including a wheelchair)?: A Little Help needed standing up from a chair using your arms (e.g., wheelchair or bedside chair)?: A Little Help needed to walk in hospital room?: A Little Help needed climbing 3-5 steps with a railing? : A Lot 6 Click Score: 17    End of Session Equipment Utilized During Treatment: Gait belt Activity Tolerance: Patient tolerated treatment well Patient left: with call bell/phone within reach;with family/visitor present;in chair;with chair alarm set Nurse Communication: Mobility status PT Visit Diagnosis: Unsteadiness on feet (R26.81);Other abnormalities of gait and mobility (R26.89);Muscle weakness (generalized) (M62.81);Difficulty in walking, not elsewhere classified (R26.2);Other symptoms and signs involving the nervous  system (R29.898)     Time: 8579-8555 PT Time Calculation (min) (ACUTE ONLY): 24 min  Charges:    $Gait Training: 8-22 mins $Therapeutic Activity: 8-22 mins PT General Charges $$ ACUTE PT VISIT: 1 Visit                    Darryle George, PTA Acute Rehabilitation Services Secure Chat Preferred  [phone]    Darryle George 08/01/2024, 3:12 PM

## 2024-08-01 NOTE — Procedures (Signed)
 HD Note:  Some information was entered later than the data was gathered due to patient care needs. The stated time with the data is accurate.  Received patient in bed to unit.   Alert and oriented.   Informed consent signed and in chart.   Access used: Right upper arm fistula Access issues: None  Patient tolerated treatment well.   TX duration: 3.5 hours  Alert, without acute distress.  Total UF removed: 2000 ml  Hand-off given to patient's nurse.   Transported back to the room   Wadie Liew L. Lenon, RN [facility].

## 2024-08-02 DIAGNOSIS — I619 Nontraumatic intracerebral hemorrhage, unspecified: Secondary | ICD-10-CM | POA: Diagnosis not present

## 2024-08-02 LAB — GLUCOSE, CAPILLARY
Glucose-Capillary: 121 mg/dL — ABNORMAL HIGH (ref 70–99)
Glucose-Capillary: 148 mg/dL — ABNORMAL HIGH (ref 70–99)

## 2024-08-02 NOTE — Progress Notes (Signed)
 Patient ID: [name] [name], female   DOB: [birth year], 43 y.o.   MRN: [id number] S: No complaints and tolerated HD well yesterday. O:BP (!) 153/59 (BP Location: Left Arm)   Pulse 80   Temp 98 F (36.7 C) (Oral)   Resp 16   Ht 5' 3 (1.6 m)   Wt 55.9 kg   SpO2 96%   BMI 21.83 kg/m   Intake/Output Summary (Last 24 hours) at 08/02/2024 0928 Last data filed at 08/01/2024 1737 Gross per 24 hour  Intake 661.75 ml  Output 2000 ml  Net -1338.25 ml   Intake/Output: I/O last 3 completed shifts: In: 1108.8 [Other:40; NG/GT:1068.8] Out: 2000 [Other:2000]  Intake/Output this shift:  No intake/output data recorded. Weight change: 2 kg Gen: NAD CVS: RRR Resp:CTA Abd: +BS, soft, NT/ND Ext: no edema, RUE AVF +T/B  Recent Labs  Lab 07/27/24 1108 07/29/24 0730  NA 128* 128*  K 4.2 4.3  CL 84* 88*  CO2 28 24  GLUCOSE 125* 105*  BUN 54* 50*  CREATININE 3.99* 3.59*  ALBUMIN  3.0* 2.8*  CALCIUM  10.4* 10.3  PHOS 3.7 3.7   Liver Function Tests: Recent Labs  Lab 07/27/24 1108 07/29/24 0730  ALBUMIN  3.0* 2.8*   No results for input(s): LIPASE, AMYLASE in the last 168 hours. No results for input(s): AMMONIA in the last 168 hours. CBC: Recent Labs  Lab 07/27/24 1108 07/29/24 0730 08/01/24 0845  WBC 3.7* 3.7* 4.0  HGB 8.5* 8.4* 7.8*  HCT 26.2* 26.2* 24.2*  MCV 97.8 98.1 99.2  PLT 169 164 154   Cardiac Enzymes: No results for input(s): CKTOTAL, CKMB, CKMBINDEX, TROPONINI in the last 168 hours. CBG: Recent Labs  Lab 07/31/24 1525 08/01/24 0625 08/01/24 1513 08/01/24 2206 08/02/24 0608  GLUCAP 191* 123* 110* 162* 121*    Iron  Studies: No results for input(s): IRON , TIBC, TRANSFERRIN, FERRITIN in the last 72 hours. Studies/Results: No results found.  carvedilol   25 mg Per Tube BID WC   cinacalcet   30 mg Oral Q M,W,F   cycloSPORINE   200 mg Per Tube QPM   cycloSPORINE   225 mg Per Tube Daily   darbepoetin (ARANESP ) injection - DIALYSIS  200 mcg  Subcutaneous Q Thu-1800   famotidine   10 mg Per Tube QHS   feeding supplement (NEPRO CARB STEADY)  1,000 mL Per Tube Q24H   fiber supplement (BANATROL TF)  60 mL Per Tube TID   gabapentin   100 mg Per Tube Daily   Gerhardt's butt cream   Topical BID   hydrALAZINE   25 mg Oral Q8H   hydrocortisone    Rectal TID   lacosamide   100 mg Per Tube BID   liver oil-zinc  oxide   Topical TID   losartan   25 mg Per Tube Q1200   multivitamin with minerals  1 tablet Per Tube Daily   mycophenolate   500 mg Per Tube BID   mouth rinse  15 mL Mouth Rinse 4 times per day   oxidized cellulose  1 each Topical Once   pantoprazole  (PROTONIX ) IV  40 mg Intravenous Daily   predniSONE   15 mg Per Tube Q breakfast    BMET    Component Value Date/Time   NA 128 (L) 07/29/2024 0730   K 4.3 07/29/2024 0730   CL 88 (L) 07/29/2024 0730   CO2 24 07/29/2024 0730   GLUCOSE 105 (H) 07/29/2024 0730   BUN 50 (H) 07/29/2024 0730   CREATININE 3.59 (H) 07/29/2024 0730   CALCIUM  10.3 07/29/2024 0730  GFRNONAA 16 (L) 07/29/2024 0730   GFRAA  02/02/2022 9167    PATIENT IDENTIFICATION ERROR. PLEASE DISREGARD RESULTS. ACCOUNT WILL BE CREDITED.   CBC    Component Value Date/Time   WBC 4.0 08/01/2024 0845   RBC 2.44 (L) 08/01/2024 0845   HGB 7.8 (L) 08/01/2024 0845   HCT 24.2 (L) 08/01/2024 0845   PLT 154 08/01/2024 0845   MCV 99.2 08/01/2024 0845   MCH 32.0 08/01/2024 0845   MCHC 32.2 08/01/2024 0845   RDW 15.7 (H) 08/01/2024 0845   LYMPHSABS 0.9 06/24/2024 0827   MONOABS 0.5 06/24/2024 0827   EOSABS 0.1 06/24/2024 0827   BASOSABS 0.0 06/24/2024 0827    Dialysis Orders: 3:45hr, 400/A1.5, EDW 55.2kg, 2K/2Ca bath, AVF, no heparin  - Prev on Mircera 150mcg IV q 2 weeks - No VDRA   Assessment/Plan: Acute R intracranial hemorrhage: 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 Needs ongoing rehab. Doing better.  ESRD:  HD on MWF.  Next HD 08/04/24  HTN/volume: BP trending back up- has had some hypotension with HD. UF as  tolerated. Off amlodipine , now on Coreg  25mg  bid, Losartan  25 mg. Has been tapered off clonidine .   Hydralazine  25mg  TID started by PMD- may need to decrease if she has low BP on HD Anemia of ESRD: Hgb 8s, transfuse prn. Continue Aranesp  200 q Thurs Secondary HPTH: CorrCa high, phos in goal. Continue sensipar , not on VDRA. Nutrition: Alb low, Remains NPO - to complete MBS to objectively evaluate her swallow. continue Nepro TF.  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 Dispo: Insurance declined LTAC, and SNF, appeal pending. Tolerated HD in recliner on multiple days  Fairy RONAL Sellar, MD [facility]

## 2024-08-02 NOTE — Plan of Care (Signed)
  Problem: Coping: Goal: Ability to adjust to condition or change in health will improve Outcome: Progressing   Problem: Tissue Perfusion: Goal: Adequacy of tissue perfusion will improve Outcome: Progressing   Problem: Education: Goal: Knowledge of disease or condition will improve Outcome: Progressing Goal: Knowledge of secondary prevention will improve (MUST DOCUMENT ALL) Outcome: Progressing Goal: Knowledge of patient specific risk factors will improve (DELETE if not current risk factor) Outcome: Progressing   Problem: Intracerebral Hemorrhage Tissue Perfusion: Goal: Complications of Intracerebral Hemorrhage will be minimized Outcome: Progressing

## 2024-08-02 NOTE — Progress Notes (Signed)
 PROGRESS NOTE  [name] [name] FMW:[id number] DOB: 04-09-1981 DOA: 05/19/2024 PCP: [name]   LOS: 75 days   Brief narrative:  Patient is a  43-yrs-old female with past medical history of hypertension, CVA, type 1 diabetes, gastroparesis, kidney and pancreatic transplant 2014 with subsequent failure of renal transplant now on end-stage renal dialysis, anxiety, narcotic abuse presented to the hospital from dialysis unit on 05/19/2024 as code stroke.  Patient was noted to have hemorrhagic stroke.  Neurology, neurosurgery in critical care were consulted.  EVD was placed on the left side and was initially admitted to neuro ICU.  She was also intubated and had prolonged intubation.  Subsequently, patient was extubated on 05/27/2024.  Important events:  9/15 admitted with ICH plus IVH, EVD was placed for hydrocephalus, intubated 9/22 palliative care meeting.  Family is insistent that they want full scope of care 9/23 Extubated 10/1 -developed fevers pancultures drawn and broad-spectrum antibiotics started 10/2 became afebrile after starting antibiotics, did not tolerate raising EVD 10/6 EVD was raised from 10 to 30 cm water , tolerating well, remained afebrile 10/7 EVD is at 30 cm water , CT scan will be done tomorrow morning, no overnight issues 10/8 she became somnolent, CT head showed increasing hydrocephalus, EVD was titrated down to 10 cm of water  with improvement in mental status 10/15 VP shunt placement 10/17 transferred to progressive unit with [name]  11/18-medically stable.  Awaiting for disposition.   Assessment/Plan: Principal Problem:   Hemorrhagic stroke [facility]) Active Problems:   Moderate protein-calorie malnutrition   Pressure injury of skin   Nontraumatic subcortical hemorrhage of right cerebral hemisphere [facility])  Acute right parietotemporal intraparenchymal hemorrhage / Intraventricular hemorrhage: Uncontrolled hypertension: Obstructive hydrocephalus s/p EVD  changed to VP shunt: Seizure disorder S/p VP shunt placement on 06/18/2024 by Dr. Mavis.  Continue Vimpat .  Neurosurgery intermittently followed the patient during hospitalization.  Currently stable   ESRD on dialysis: History of failed renal transplant: Secondary hyperparathyroidism: Nephrology on board.  Continue CellCept  and prednisone .  On Monday, Wednesday, Friday hemodialysis schedule.  Will need outpatient hemodialysis set up  Acute bronchitis with fever: Resolved   History of pancreatic/kidney transplantation:  Continue cyclosporine , mycophenolate , and prednisone .   Hypertension:  Currently on Coreg , amlodipine , hydralazin and clonidine  on nondialysis days.   Anemia of chronic disease:  Latest hemoglobin 7.8.Transfuse if Hb below 7.0   Type 1 diabetes with vasculopathy/gastroparesis:  Not on long-term insulin  anymore due to pancreatic transplant. On sliding scale at this time.   Moderate protein calorie malnutrition/dysphagia:  On PEG tube feeding.  Patient has been advanced on regular diet with some oral intake  Stage I sacral pressure ulcer present on admission: Continue wound care   Debility/deconditioning/disposition/goals of care:  Palliative care had seen the patient during hospitalization.  Family has insisted full code.   Palliative care has signed off 10/17.  Disposition pending.  TOC on board.    DVT prophylaxis: SCDs Start: 05/19/24 1724   Disposition: Skilled nursing facility.  Medically stable for disposition status is: Inpatient  Remains inpatient appropriate because: Awaiting for disposition to skilled nursing facility    Code Status:     Code Status: Full Code  Family Communication:  Spoke with the patient's mother at bedside 08/02/2024  Consultants: Nephrology Neurosurgery  Procedures: Ventriculoperitoneal shunt placement Hemodialysis  Anti-infectives:  None currently   Subjective: Today, patient was seen and examined at bedside.   No interval complaints reported by mom at bedside.  Patient comfortably sleeping.  Vitals:   08/02/24  0320 08/02/24 0802  BP: (!) 169/72 (!) 153/59  Pulse: 75 80  Resp: 17 16  Temp: 97.6 F (36.4 C) 98 F (36.7 C)  SpO2: 100% 96%    Intake/Output Summary (Last 24 hours) at 08/02/2024 0952 Last data filed at 08/01/2024 1737 Gross per 24 hour  Intake 661.75 ml  Output 2000 ml  Net -1338.25 ml     Filed Weights   08/01/24 1211 08/01/24 1220 08/02/24 0500  Weight: 56.6 kg 54.6 kg 55.9 kg   Body mass index is 21.83 kg/m.   Physical Exam:  General:  Average built, not in obvious distress, chronically ill and deconditioned, HENT:   No scleral pallor or icterus noted. Oral mucosa is moist.  Right-sided scalp with scar. Chest: Diminished breath sounds bilaterally CVS: S1 &S2 heard. No murmur.  Regular rate and rhythm. Abdomen: Soft, nontender, nondistended.  Bowel sounds are heard.  PEG tube in place. Extremities: No cyanosis, clubbing, right upper extremity AV fistula in place.  Peripheral pulses are palpable. Psych: Somnolent today. CNS:  No cranial nerve deficits.  Generalized weakness of the extremities mostly of the lower extremities. Skin: Warm and dry.  No rashes noted.   Data Review: I have personally reviewed the following laboratory data and studies,  CBC: Recent Labs  Lab 07/27/24 1108 07/29/24 0730 08/01/24 0845  WBC 3.7* 3.7* 4.0  HGB 8.5* 8.4* 7.8*  HCT 26.2* 26.2* 24.2*  MCV 97.8 98.1 99.2  PLT 169 164 154   Basic Metabolic Panel: Recent Labs  Lab 07/27/24 1108 07/29/24 0730  NA 128* 128*  K 4.2 4.3  CL 84* 88*  CO2 28 24  GLUCOSE 125* 105*  BUN 54* 50*  CREATININE 3.99* 3.59*  CALCIUM  10.4* 10.3  PHOS 3.7 3.7   Liver Function Tests: Recent Labs  Lab 07/27/24 1108 07/29/24 0730  ALBUMIN  3.0* 2.8*   No results for input(s): LIPASE, AMYLASE in the last 168 hours. No results for input(s): AMMONIA in the last 168 hours. Cardiac  Enzymes: No results for input(s): CKTOTAL, CKMB, CKMBINDEX, TROPONINI in the last 168 hours. BNP (last 3 results) Recent Labs    02/14/24 0949  BNP 1,486.6*    ProBNP (last 3 results) No results for input(s): PROBNP in the last 8760 hours.  CBG: Recent Labs  Lab 07/31/24 1525 08/01/24 0625 08/01/24 1513 08/01/24 2206 08/02/24 0608  GLUCAP 191* 123* 110* 162* 121*   No results found for this or any previous visit (from the past 240 hours).   Studies: No results found.     Vernal Alstrom, MD  [name] 08/02/2024  If 7PM-7AM, please contact night-coverage

## 2024-08-03 DIAGNOSIS — I619 Nontraumatic intracerebral hemorrhage, unspecified: Secondary | ICD-10-CM | POA: Diagnosis not present

## 2024-08-03 LAB — GLUCOSE, CAPILLARY
Glucose-Capillary: 90 mg/dL (ref 70–99)
Glucose-Capillary: 181 mg/dL — ABNORMAL HIGH (ref 70–99)

## 2024-08-03 NOTE — Progress Notes (Signed)
 Patient ID: [name], female   DOB: 1981-04-09, 43 y.o.   MRN: [id number] S: no complaints O:BP (!) 158/68 (BP Location: Left Arm)   Pulse 79   Temp 100.3 F (37.9 C) (Oral)   Resp 19   Ht 5' 3 (1.6 m)   Wt 55.8 kg   SpO2 99%   BMI 21.79 kg/m  No intake or output data in the 24 hours ending 08/03/24 1044 Intake/Output: No intake/output data recorded.  Intake/Output this shift:  No intake/output data recorded. Weight change: -0.8 kg Gen: NAd CVS: RRR Resp:CTA Abd: +BS, soft, NT/ND Ext: no edema, RUE AVF +T/B  Recent Labs  Lab 07/27/24 1108 07/29/24 0730  NA 128* 128*  K 4.2 4.3  CL 84* 88*  CO2 28 24  GLUCOSE 125* 105*  BUN 54* 50*  CREATININE 3.99* 3.59*  ALBUMIN  3.0* 2.8*  CALCIUM  10.4* 10.3  PHOS 3.7 3.7   Liver Function Tests: Recent Labs  Lab 07/27/24 1108 07/29/24 0730  ALBUMIN  3.0* 2.8*   No results for input(s): LIPASE, AMYLASE in the last 168 hours. No results for input(s): AMMONIA in the last 168 hours. CBC: Recent Labs  Lab 07/27/24 1108 07/29/24 0730 08/01/24 0845  WBC 3.7* 3.7* 4.0  HGB 8.5* 8.4* 7.8*  HCT 26.2* 26.2* 24.2*  MCV 97.8 98.1 99.2  PLT 169 164 154   Cardiac Enzymes: No results for input(s): CKTOTAL, CKMB, CKMBINDEX, TROPONINI in the last 168 hours. CBG: Recent Labs  Lab 08/01/24 1513 08/01/24 2206 08/02/24 0608 08/02/24 1510 08/03/24 0813  GLUCAP 110* 162* 121* 148* 90    Iron  Studies: No results for input(s): IRON , TIBC, TRANSFERRIN, FERRITIN in the last 72 hours. Studies/Results: No results found.  carvedilol   25 mg Per Tube BID WC   cinacalcet   30 mg Oral Q M,W,F   cycloSPORINE   200 mg Per Tube QPM   cycloSPORINE   225 mg Per Tube Daily   darbepoetin (ARANESP ) injection - DIALYSIS  200 mcg Subcutaneous Q Thu-1800   famotidine   10 mg Per Tube QHS   feeding supplement (NEPRO CARB STEADY)  1,000 mL Per Tube Q24H   fiber supplement (BANATROL TF)  60 mL Per Tube TID   gabapentin    100 mg Per Tube Daily   Gerhardt's butt cream   Topical BID   hydrALAZINE   25 mg Oral Q8H   hydrocortisone    Rectal TID   lacosamide   100 mg Per Tube BID   liver oil-zinc  oxide   Topical TID   losartan   25 mg Per Tube Q1200   multivitamin with minerals  1 tablet Per Tube Daily   mycophenolate   500 mg Per Tube BID   mouth rinse  15 mL Mouth Rinse 4 times per day   oxidized cellulose  1 each Topical Once   pantoprazole  (PROTONIX ) IV  40 mg Intravenous Daily   predniSONE   15 mg Per Tube Q breakfast    BMET    Component Value Date/Time   NA 128 (L) 07/29/2024 0730   K 4.3 07/29/2024 0730   CL 88 (L) 07/29/2024 0730   CO2 24 07/29/2024 0730   GLUCOSE 105 (H) 07/29/2024 0730   BUN 50 (H) 07/29/2024 0730   CREATININE 3.59 (H) 07/29/2024 0730   CALCIUM  10.3 07/29/2024 0730   GFRNONAA 16 (L) 07/29/2024 0730   GFRAA  02/02/2022 9167    PATIENT IDENTIFICATION ERROR. PLEASE DISREGARD RESULTS. ACCOUNT WILL BE CREDITED.   CBC    Component Value Date/Time  WBC 4.0 08/01/2024 0845   RBC 2.44 (L) 08/01/2024 0845   HGB 7.8 (L) 08/01/2024 0845   HCT 24.2 (L) 08/01/2024 0845   PLT 154 08/01/2024 0845   MCV 99.2 08/01/2024 0845   MCH 32.0 08/01/2024 0845   MCHC 32.2 08/01/2024 0845   RDW 15.7 (H) 08/01/2024 0845   LYMPHSABS 0.9 06/24/2024 0827   MONOABS 0.5 06/24/2024 0827   EOSABS 0.1 06/24/2024 0827   BASOSABS 0.0 06/24/2024 0827    Dialysis Orders: 3:45hr, 400/A1.5, EDW 55.2kg, 2K/2Ca bath, AVF, no heparin  - Prev on Mircera 150mcg IV q 2 weeks - No VDRA   Assessment/Plan: Acute R intracranial hemorrhage: 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 Needs ongoing rehab. Doing better.  ESRD:  HD on MWF.  Next HD 08/04/24  HTN/volume: BP trending back up- has had some hypotension with HD. UF as tolerated. Off amlodipine , now on Coreg  25mg  bid, Losartan  25 mg. Has been tapered off clonidine .   Hydralazine  25mg  TID started by PMD- may need to decrease if she has low BP on HD Anemia of  ESRD: Hgb 8s, transfuse prn. Continue Aranesp  200 q Thurs Secondary HPTH: CorrCa high, phos in goal. Continue sensipar , not on VDRA. Nutrition: Alb low, Remains NPO - to complete MBS to objectively evaluate her swallow. continue Nepro TF.  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 Dispo: Insurance declined LTAC, and SNF, appeal pending. Tolerated HD in recliner on multiple days    Fairy RONAL Sellar, MD [facility]

## 2024-08-03 NOTE — Progress Notes (Signed)
 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  Patient was noted to have hemorrhagic stroke.  Neurology, neurosurgery in critical care were consulted.  EVD was placed on the left side and was initially admitted to neuro ICU.  She was also intubated and had prolonged intubation.  Subsequently, patient was extubated on 05/27/2024.  Important events:  9/15 admitted with ICH plus IVH, EVD was placed for hydrocephalus, intubated 9/22 palliative care meeting.  Family is insistent that they want full scope of care 9/23 Extubated 10/1 -developed fevers pancultures drawn and broad-spectrum antibiotics started 10/2 became afebrile after starting antibiotics, did not tolerate raising EVD 10/6 EVD was raised from 10 to 30 cm water , tolerating well, remained afebrile 10/7 EVD is at 30 cm water , CT scan will be done tomorrow morning, no overnight issues 10/8 she became somnolent, CT head showed increasing hydrocephalus, EVD was titrated down to 10 cm of water  with improvement in mental status 10/15 VP shunt placement 10/17 transferred to progressive unit with [name]  11/18-medically stable.  Awaiting for disposition.   Assessment/Plan: Principal Problem:   Hemorrhagic stroke [facility]) Active Problems:   Moderate protein-calorie malnutrition   Pressure injury of skin   Nontraumatic subcortical hemorrhage of right cerebral hemisphere [facility])  Acute right parietotemporal intraparenchymal hemorrhage / Intraventricular hemorrhage: Uncontrolled hypertension: Obstructive hydrocephalus s/p EVD  changed to VP shunt: Seizure disorder S/p VP shunt placement on 06/18/2024 by Dr. Mavis.  Continue Vimpat .  Neurosurgery intermittently followed the patient during hospitalization.  Currently stable   ESRD on dialysis: History of failed renal transplant: Secondary hyperparathyroidism: Nephrology on board.  Continue CellCept  and prednisone .  On Monday, Wednesday, Friday hemodialysis schedule.  Will need outpatient hemodialysis set up  Acute bronchitis with fever: Resolved   History of pancreatic/kidney transplantation:  Continue cyclosporine , mycophenolate , and prednisone .   Hypertension:  Currently on Coreg , amlodipine , hydralazin and clonidine  on nondialysis days.   Anemia of chronic disease:  Latest hemoglobin 7.8.Transfuse if Hb below 7.0   Type 1 diabetes with vasculopathy/gastroparesis:  Not on long-term insulin  anymore due to pancreatic transplant. On sliding scale at this time.   Moderate protein calorie malnutrition/dysphagia:  On PEG tube feeding.  Patient has been advanced on regular diet with some oral intake  Stage I sacral pressure ulcer present on admission: Continue wound care   Debility/deconditioning/disposition/goals of care:  Palliative care had seen the patient during hospitalization.  Family has insisted full code.   Palliative care has signed off 10/17.  Disposition pending.  TOC on board.    DVT prophylaxis: SCDs Start: 05/19/24 1724   Disposition: Skilled nursing facility.  Medically stable for disposition status is: Inpatient  Remains inpatient appropriate because: Awaiting for disposition to skilled nursing facility    Code Status:     Code Status: Full Code  Family Communication:  Spoke with the patient's mother at bedside 08/02/2024  Consultants: Nephrology Neurosurgery  Procedures: Ventriculoperitoneal shunt placement Hemodialysis  Anti-infectives:  None currently   Subjective: Today, patient was seen and examined at bedside.   Denies interval complaints.  Denies any shortness of breath dyspnea chest pain or fever.  Has occasional  cough  Vitals:   08/03/24 0440 08/03/24 0744  BP: 108/65 (!) 158/68  Pulse: 77 79  Resp:  19  Temp: 98.5 F (36.9 C) 100.3 F (37.9 C)  SpO2: 100% 99%   No intake or output data in the 24 hours ending 08/03/24 1034    Filed Weights   08/01/24 1220 08/02/24 0500 08/03/24 0500  Weight: 54.6 kg 55.9 kg 55.8 kg   Body mass index is 21.79 kg/m.   Physical Exam:  General:  Average built, not in obvious distress appears chronically ill HENT:   No scleral pallor or icterus noted. Oral mucosa is moist.  Chest: Diminished breath sounds bilaterally CVS: S1 &S2 heard. No murmur.  Regular rate and rhythm. Abdomen: Soft, nontender, nondistended.  Bowel sounds are heard.  PEG tube in place Extremities: No cyanosis, clubbing or edema.  Peripheral pulses are palpable.  Left upper extremity AV fistula Psych: Alert, awake and oriented, normal mood CNS:  No cranial nerve deficits.  Generalized weakness noted of the extremities Skin: Warm and dry.  No rashes noted.   Data Review: I have personally reviewed the following laboratory data and studies,  CBC: Recent Labs  Lab 07/27/24 1108 07/29/24 0730 08/01/24 0845  WBC 3.7* 3.7* 4.0  HGB 8.5* 8.4* 7.8*  HCT 26.2* 26.2* 24.2*  MCV 97.8 98.1 99.2  PLT 169 164 154   Basic Metabolic Panel: Recent Labs  Lab 07/27/24 1108 07/29/24 0730  NA 128* 128*  K 4.2 4.3  CL 84* 88*  CO2 28 24  GLUCOSE 125* 105*  BUN 54* 50*  CREATININE 3.99* 3.59*  CALCIUM  10.4* 10.3  PHOS 3.7 3.7   Liver Function Tests: Recent Labs  Lab 07/27/24 1108 07/29/24 0730  ALBUMIN  3.0* 2.8*   No results for input(s): LIPASE, AMYLASE in the last 168 hours. No results for input(s): AMMONIA in the last 168 hours. Cardiac Enzymes: No results for input(s): CKTOTAL, CKMB, CKMBINDEX, TROPONINI in the last 168 hours. BNP (last 3  results) Recent Labs    02/14/24 0949  BNP 1,486.6*    ProBNP (last 3 results) No results for input(s): PROBNP in the last 8760 hours.  CBG: Recent Labs  Lab 08/01/24 1513 08/01/24 2206 08/02/24 0608 08/02/24 1510 08/03/24 0813  GLUCAP 110* 162* 121* 148* 90   No results found for this or any previous visit (from the past 240 hours).   Studies: No results found.     Vernal Alstrom, MD  [name] 08/03/2024  If 7PM-7AM, please contact night-coverage

## 2024-08-03 NOTE — Plan of Care (Signed)
  Problem: Education: Goal: Ability to describe self-care measures that may prevent or decrease complications (Diabetes Survival Skills Education) will improve Outcome: Progressing   Problem: Skin Integrity: Goal: Risk for impaired skin integrity will decrease Outcome: Progressing   

## 2024-08-04 ENCOUNTER — Inpatient Hospital Stay (HOSPITAL_COMMUNITY)

## 2024-08-04 DIAGNOSIS — I619 Nontraumatic intracerebral hemorrhage, unspecified: Secondary | ICD-10-CM | POA: Diagnosis not present

## 2024-08-04 LAB — RENAL FUNCTION PANEL
Albumin: 2.7 g/dL — ABNORMAL LOW (ref 3.5–5.0)
Anion gap: 15 (ref 5–15)
BUN: 90 mg/dL — ABNORMAL HIGH (ref 6–20)
CO2: 27 mmol/L (ref 22–32)
Calcium: 10.2 mg/dL (ref 8.9–10.3)
Chloride: 88 mmol/L — ABNORMAL LOW (ref 98–111)
Creatinine, Ser: 5.44 mg/dL — ABNORMAL HIGH (ref 0.44–1.00)
GFR, Estimated: 9 mL/min — ABNORMAL LOW (ref >60)
Glucose, Bld: 112 mg/dL — ABNORMAL HIGH (ref 70–99)
Phosphorus: 4.8 mg/dL — ABNORMAL HIGH (ref 2.5–4.6)
Potassium: 5 mmol/L (ref 3.5–5.1)
Sodium: 130 mmol/L — ABNORMAL LOW (ref 135–145)

## 2024-08-04 LAB — CBC
HCT: 26.9 % — ABNORMAL LOW (ref 36.0–46.0)
Hemoglobin: 8.6 g/dL — ABNORMAL LOW (ref 12.0–15.0)
MCH: 31.9 pg (ref 26.0–34.0)
MCHC: 32 g/dL (ref 30.0–36.0)
MCV: 99.6 fL (ref 80.0–100.0)
Platelets: 146 K/uL — ABNORMAL LOW (ref 150–400)
RBC: 2.7 MIL/uL — ABNORMAL LOW (ref 3.87–5.11)
RDW: 15.9 % — ABNORMAL HIGH (ref 11.5–15.5)
WBC: 5 K/uL (ref 4.0–10.5)
nRBC: 0 % (ref 0.0–0.2)

## 2024-08-04 LAB — GLUCOSE, CAPILLARY
Glucose-Capillary: 109 mg/dL — ABNORMAL HIGH (ref 70–99)
Glucose-Capillary: 129 mg/dL — ABNORMAL HIGH (ref 70–99)

## 2024-08-04 MED ORDER — MORPHINE SULFATE (PF) 2 MG/ML IV SOLN: 2.0000 mg | Freq: Once | INTRAVENOUS | Status: DC

## 2024-08-04 MED ORDER — HYDROMORPHONE HCL 1 MG/ML IJ SOLN: 0.2500 mg | Freq: Once | INTRAMUSCULAR | Status: DC

## 2024-08-04 NOTE — Progress Notes (Signed)
 Patient ID: [name] [name], female   DOB: [birth year], 43 y.o.   MRN: [id number] S: no complaints, seen at HD  O:BP (!) 172/65   Pulse 75   Temp 97.7 F (36.5 C) (Oral)   Resp 17   Ht 5' 3 (1.6 m)   Wt 58.2 kg   SpO2 99%   BMI 22.73 kg/m   Intake/Output Summary (Last 24 hours) at 08/04/2024 0853 Last data filed at 08/04/2024 0800 Gross per 24 hour  Intake 2807.25 ml  Output --  Net 2807.25 ml   Intake/Output: No intake/output data recorded.  Intake/Output this shift:  Total I/O In: 2807.3 [NG/GT:2807.3] Out: -  Weight change: 2.4 kg Gen: NAd CVS: RRR Resp:CTA Abd: +BS, soft, NT/ND Ext: no edema, RUE AVF +T/B  Recent Labs  Lab 07/29/24 0730 08/04/24 0729  NA 128* 130*  K 4.3 5.0  CL 88* 88*  CO2 24 27  GLUCOSE 105* 112*  BUN 50* 90*  CREATININE 3.59* 5.44*  ALBUMIN  2.8* 2.7*  CALCIUM  10.3 10.2  PHOS 3.7 4.8*   Liver Function Tests: Recent Labs  Lab 07/29/24 0730 08/04/24 0729  ALBUMIN  2.8* 2.7*   No results for input(s): LIPASE, AMYLASE in the last 168 hours. No results for input(s): AMMONIA in the last 168 hours. CBC: Recent Labs  Lab 07/29/24 0730 08/01/24 0845 08/04/24 0729  WBC 3.7* 4.0 5.0  HGB 8.4* 7.8* 8.6*  HCT 26.2* 24.2* 26.9*  MCV 98.1 99.2 99.6  PLT 164 154 146*   Cardiac Enzymes: No results for input(s): CKTOTAL, CKMB, CKMBINDEX, TROPONINI in the last 168 hours. CBG: Recent Labs  Lab 08/02/24 0608 08/02/24 1510 08/03/24 0813 08/03/24 1647 08/04/24 0605  GLUCAP 121* 148* 90 181* 109*    Iron  Studies: No results for input(s): IRON , TIBC, TRANSFERRIN, FERRITIN in the last 72 hours. Studies/Results: No results found.  carvedilol   25 mg Per Tube BID WC   cinacalcet   30 mg Oral Q M,W,F   cycloSPORINE   200 mg Per Tube QPM   cycloSPORINE   225 mg Per Tube Daily   darbepoetin (ARANESP ) injection - DIALYSIS  200 mcg Subcutaneous Q Thu-1800   famotidine   10 mg Per Tube QHS   feeding supplement (NEPRO CARB  STEADY)  1,000 mL Per Tube Q24H   fiber supplement (BANATROL TF)  60 mL Per Tube TID   gabapentin   100 mg Per Tube Daily   Gerhardt's butt cream   Topical BID   hydrALAZINE   25 mg Oral Q8H   hydrocortisone    Rectal TID   lacosamide   100 mg Per Tube BID   liver oil-zinc  oxide   Topical TID   losartan   25 mg Per Tube Q1200   multivitamin with minerals  1 tablet Per Tube Daily   mycophenolate   500 mg Per Tube BID   mouth rinse  15 mL Mouth Rinse 4 times per day   oxidized cellulose  1 each Topical Once   pantoprazole  (PROTONIX ) IV  40 mg Intravenous Daily   predniSONE   15 mg Per Tube Q breakfast    BMET    Component Value Date/Time   NA 130 (L) 08/04/2024 0729   K 5.0 08/04/2024 0729   CL 88 (L) 08/04/2024 0729   CO2 27 08/04/2024 0729   GLUCOSE 112 (H) 08/04/2024 0729   BUN 90 (H) 08/04/2024 0729   CREATININE 5.44 (H) 08/04/2024 0729   CALCIUM  10.2 08/04/2024 0729   GFRNONAA 9 (L) 08/04/2024 0729   GFRAA  02/02/2022  9167    PATIENT IDENTIFICATION ERROR. PLEASE DISREGARD RESULTS. ACCOUNT WILL BE CREDITED.   CBC    Component Value Date/Time   WBC 5.0 08/04/2024 0729   RBC 2.70 (L) 08/04/2024 0729   HGB 8.6 (L) 08/04/2024 0729   HCT 26.9 (L) 08/04/2024 0729   PLT 146 (L) 08/04/2024 0729   MCV 99.6 08/04/2024 0729   MCH 31.9 08/04/2024 0729   MCHC 32.0 08/04/2024 0729   RDW 15.9 (H) 08/04/2024 0729   LYMPHSABS 0.9 06/24/2024 0827   MONOABS 0.5 06/24/2024 0827   EOSABS 0.1 06/24/2024 0827   BASOSABS 0.0 06/24/2024 0827    Dialysis Orders: 3:45hr, 400/A1.5, EDW 55.2kg, 2K/2Ca bath, AVF, no heparin  - Prev on Mircera 150mcg IV q 2 weeks - No VDRA   Assessment/Plan: Acute R intracranial hemorrhage: 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 Needs ongoing rehab. Doing better.  ESRD:  HD on MWF.  HD today.  HTN/volume: BP trending back up- has had some hypotension with HD. UF as tolerated. Off amlodipine , now on Coreg  25mg  bid, Losartan  25 mg. Has been tapered off  clonidine .   Hydralazine  25mg  TID started by PMD- will see how she does with HD today.  AM BP was 170s so likely will tolerate.  Anemia of ESRD: Hgb 8s, transfuse prn. Continue Aranesp  200 q Thurs Secondary HPTH: CorrCa high, phos in goal. Continue sensipar , not on VDRA. Nutrition: Alb low, on nepro TF but has a regular diet ordered too. RD note 11/28 cal count intervention is to continue TF.  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 Dispo: Insurance declined LTAC, and SNF, appeal pending. Tolerated HD in recliner on multiple days   Manuelita Barters MD [facility] Pager [phone]

## 2024-08-04 NOTE — Plan of Care (Signed)
  Problem: Education: Goal: Ability to describe self-care measures that may prevent or decrease complications (Diabetes Survival Skills Education) will improve Outcome: Progressing   Problem: Health Behavior/Discharge Planning: Goal: Ability to manage health-related needs will improve Outcome: Progressing   Problem: Education: Goal: Knowledge of disease or condition will improve Outcome: Progressing Goal: Knowledge of secondary prevention will improve (MUST DOCUMENT ALL) Outcome: Progressing Goal: Knowledge of patient specific risk factors will improve (DELETE if not current risk factor) Outcome: Progressing   Problem: Intracerebral Hemorrhage Tissue Perfusion: Goal: Complications of Intracerebral Hemorrhage will be minimized Outcome: Progressing

## 2024-08-04 NOTE — Progress Notes (Addendum)
 Received patient in bed to unit.  Alert and oriented.  Informed consent signed and in chart.   TX duration:3 hours and 18 minutes in chair...pateint kept bending right arm setting alarms off but also clotted off cartridge and ended early.  Patient tolerated well.  Transported back to the room  Alert, without acute distress.  Hand-off given to patient's nurse.   Access used: Right Upper arm fistula Access issues: BFR to 350 due to high arterial pressures.  Total UF removed:   08/04/24 1229  Vitals  Temp 98.1 F (36.7 C)  Temp Source Oral  BP (!) 146/73  Pulse Rate 79  ECG Heart Rate 80  Resp 17  Weight  (Patient in dialysis chair)  Type of Weight Post-Dialysis  Oxygen  Therapy  SpO2 100 %  O2 Device Room Air  Patient Activity (if Appropriate) In chair  Pulse Oximetry Type Continuous  During Treatment Monitoring  Duration of HD Treatment -hour(s) 3.31 hour(s) (3 Hours and 18 minutes)  HD Safety Checks Performed Yes  Intra-Hemodialysis Comments See progress note (Patient clotted off with 12 minutes left)  Post Treatment  Dialyzer Clearance Clear  Liters Processed 69.7  Fluid Removed (mL) 1900 mL  Tolerated HD Treatment No (Comment)  Post-Hemodialysis Comments Patient kept bending arm and causing alarms but also made machine clot off with 12 minutes left.  Fistula / Graft Right Upper arm Arteriovenous fistula  Placement Date/Time: (c) 12/08/20 1403   Placed prior to admission: Yes  Orientation: Right  Access Location: Upper arm  Access Type: Arteriovenous fistula  Site Condition No complications  Fistula / Graft Assessment Present;Thrill;Bruit  Status Patent;Deaccessed  Drainage Description None     Camellia Brasil LPN [facility]

## 2024-08-04 NOTE — Plan of Care (Signed)
  Problem: Education: Goal: Ability to describe self-care measures that may prevent or decrease complications (Diabetes Survival Skills Education) will improve Outcome: Completed/Met Goal: Individualized Educational Video(s) Outcome: Completed/Met   Problem: Coping: Goal: Ability to adjust to condition or change in health will improve Outcome: Completed/Met

## 2024-08-04 NOTE — Plan of Care (Signed)
  Problem: Education: Goal: Ability to describe self-care measures that may prevent or decrease complications (Diabetes Survival Skills Education) will improve Outcome: Progressing   Problem: Health Behavior/Discharge Planning: Goal: Ability to manage health-related needs will improve Outcome: Progressing   

## 2024-08-04 NOTE — Progress Notes (Signed)
 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  Patient was noted to have hemorrhagic stroke.  Neurology, neurosurgery in critical care were consulted.  EVD was placed on the left side and was initially admitted to neuro ICU.  She was also intubated and had prolonged intubation.  Subsequently, patient was extubated on 05/27/2024.  Important events:  9/15 admitted with ICH plus IVH, EVD was placed for hydrocephalus, intubated 9/22 palliative care meeting.  Family is insistent that they want full scope of care 9/23 Extubated 10/1 -developed fevers pancultures drawn and broad-spectrum antibiotics started 10/2 became afebrile after starting antibiotics, did not tolerate raising EVD 10/6 EVD was raised from 10 to 30 cm water , tolerating well, remained afebrile 10/7 EVD is at 30 cm water , CT scan will be done tomorrow morning, no overnight issues 10/8 she became somnolent, CT head showed increasing hydrocephalus, EVD was titrated down to 10 cm of water  with improvement in mental status 10/15 VP shunt placement 10/17 transferred to progressive unit with [name]  11/18-medically stable.  Awaiting for disposition.   Assessment/Plan: Principal Problem:   Hemorrhagic stroke [facility]) Active Problems:   Moderate protein-calorie malnutrition   Pressure injury of skin   Nontraumatic subcortical hemorrhage of right cerebral hemisphere [facility])  Acute right parietotemporal intraparenchymal hemorrhage / Intraventricular hemorrhage: Uncontrolled hypertension: Obstructive hydrocephalus s/p EVD  changed to VP shunt: Seizure disorder S/p VP shunt placement on 06/18/2024 by Dr. Mavis.  Continue Vimpat .  Neurosurgery intermittently followed the patient during hospitalization.  Currently stable   ESRD on dialysis: History of failed renal transplant: Secondary hyperparathyroidism: Nephrology on board.  Continue CellCept  and prednisone .  On Monday, Wednesday, Friday hemodialysis schedule.  Will need outpatient hemodialysis set up  Acute bronchitis with fever: Resolved   History of pancreatic/kidney transplantation:  Continue cyclosporine , mycophenolate , and prednisone .   Hypertension:  Currently on Coreg , amlodipine , hydralazin and clonidine  on nondialysis days.  BP is stable.  Anemia of chronic disease:  Latest hemoglobin 8.6.Transfuse if Hb below 7.0   Type 1 diabetes with vasculopathy/gastroparesis:  Not on long-term insulin  anymore due to pancreatic transplant. On sliding scale at this time.   Moderate protein calorie malnutrition/dysphagia:  On PEG tube feeding.  Patient has been advanced on regular diet with some oral intake  Stage I sacral pressure ulcer present on admission: Continue wound care   Debility/deconditioning/disposition/goals of care:  Palliative care had seen the patient during hospitalization.  Family has insisted full code.   Palliative care has signed off 10/17.  Disposition pending.  TOC on board.    DVT prophylaxis: SCDs Start: 05/19/24 1724   Disposition: Skilled nursing facility.  Medically stable for disposition status is: Inpatient  Remains inpatient appropriate because: Awaiting for disposition to skilled nursing facility    Code Status:     Code Status: Full Code  Family Communication:  Spoke with the patient's mother at bedside 08/02/2024  Consultants: Nephrology Neurosurgery  Procedures: Ventriculoperitoneal shunt placement Hemodialysis  Anti-infectives:  None currently   Subjective: Today, patient was seen and  examined at bedside.  Seen during hemodialysis denies shortness of breath dyspnea chest pain shortness some cough.  No nausea vomiting fever chills or rigor.  Vitals:   08/04/24 1000 08/04/24 1030  BP: (!) 148/64 (!) 144/70  Pulse: 77 80  Resp: 15 16  Temp:    SpO2: 99% 100%    Intake/Output Summary (Last 24 hours) at 08/04/2024 1038 Last data filed at 08/04/2024 0800 Gross per 24 hour  Intake 2807.25 ml  Output --  Net 2807.25 ml      Filed Weights   08/02/24 0500 08/03/24 0500 08/04/24 0500  Weight: 55.9 kg 55.8 kg 58.2 kg   Body mass index is 22.73 kg/m.   Physical Exam:  General:  Average built, not in obvious distress appears chronically ill HENT:   No scleral pallor or icterus noted. Oral mucosa is moist.  Chest: Diminished breath sounds bilaterally CVS: S1 &S2 heard. No murmur.  Regular rate and rhythm. Abdomen: Soft, nontender, nondistended.  Bowel sounds are heard.  PEG tube in place Extremities: No cyanosis, clubbing or edema.  Peripheral pulses are palpable.  Left upper extremity AV fistula Psych: Alert, awake and oriented, normal mood CNS:  No cranial nerve deficits.  Generalized weakness noted of the extremities Skin: Warm and dry.  No rashes noted.   Data Review: I have personally reviewed the following laboratory data and studies,  CBC: Recent Labs  Lab 07/29/24 0730 08/01/24 0845 08/04/24 0729  WBC 3.7* 4.0 5.0  HGB 8.4* 7.8* 8.6*  HCT 26.2* 24.2* 26.9*  MCV 98.1 99.2 99.6  PLT 164 154 146*   Basic Metabolic Panel: Recent Labs  Lab 07/29/24 0730 08/04/24 0729  NA 128* 130*  K 4.3 5.0  CL 88* 88*  CO2 24 27  GLUCOSE 105* 112*  BUN 50* 90*  CREATININE 3.59* 5.44*  CALCIUM  10.3 10.2  PHOS 3.7 4.8*   Liver Function Tests: Recent Labs  Lab 07/29/24 0730 08/04/24 0729  ALBUMIN  2.8* 2.7*   No results for input(s): LIPASE, AMYLASE in the last 168 hours. No results for input(s): AMMONIA in the last 168 hours. Cardiac  Enzymes: No results for input(s): CKTOTAL, CKMB, CKMBINDEX, TROPONINI in the last 168 hours. BNP (last 3 results) Recent Labs    02/14/24 0949  BNP 1,486.6*    ProBNP (last 3 results) No results for input(s): PROBNP in the last 8760 hours.  CBG: Recent Labs  Lab 08/02/24 0608 08/02/24 1510 08/03/24 0813 08/03/24 1647 08/04/24 0605  GLUCAP 121* 148* 90 181* 109*   No results found for this or any previous visit (from the past 240 hours).   Studies: No results found.     Vernal Alstrom, MD  [name] 08/04/2024  If 7PM-7AM, please contact night-coverage

## 2024-08-04 NOTE — Progress Notes (Signed)
 PT Cancellation Note  Patient Details Name: [name] [name] MRN: [id number] DOB: [birth year]   Cancelled Treatment:    Reason Eval/Treat Not Completed: Patient at procedure or test/unavailable (HD)  Aleck Daring, PT, DPT Acute Rehabilitation Services Office [phone]    [name] 08/04/2024, 1:38 PM

## 2024-08-05 DIAGNOSIS — I619 Nontraumatic intracerebral hemorrhage, unspecified: Secondary | ICD-10-CM | POA: Diagnosis not present

## 2024-08-05 LAB — GLUCOSE, CAPILLARY
Glucose-Capillary: 113 mg/dL — ABNORMAL HIGH (ref 70–99)
Glucose-Capillary: 170 mg/dL — ABNORMAL HIGH (ref 70–99)
Glucose-Capillary: 128 mg/dL — ABNORMAL HIGH (ref 70–99)

## 2024-08-05 MED ORDER — HYDRALAZINE HCL 25 MG PO TABS
25.0000 mg | ORAL_TABLET | Freq: Three times a day (TID) | ORAL | Status: DC
  Administered 2024-08-05 – 2024-08-08 (×10): 25 mg
  Filled 2024-08-05 (×10): qty 1

## 2024-08-05 MED ORDER — SIMETHICONE 40 MG/0.6ML PO SUSP
40.0000 mg | Freq: Four times a day (QID) | ORAL | Status: DC | PRN
  Administered 2024-08-05 – 2024-08-06 (×5): 40 mg
  Filled 2024-08-05 (×7): qty 0.6

## 2024-08-05 NOTE — TOC Progression Note (Signed)
 Transition of Care [facility]) - Progression Note    Patient Details  Name: [name] [name] MRN: [id number] Date of Birth: 19-Jan-1981  Transition of Care [facility]) CM/SW Contact  [name], [name] Phone Number: 08/05/2024, 10:14 AM  Clinical Narrative:   CSW contacted Admissions with [facility] to ask about bed availability, they are still reviewing. No offer at this time.    Expected Discharge Plan: Skilled Nursing Facility Barriers to Discharge: No SNF bed, Insurance Authorization               Expected Discharge Plan and Services In-house Referral: Clinical Social Work   Post Acute Care Choice: Skilled Nursing Facility Living arrangements for the past 2 months: Apartment                                       Social Drivers of Health (SDOH) Interventions SDOH Screenings   Food Insecurity: Patient Unable To Answer (05/20/2024)  Housing: Unknown (07/14/2024)  Transportation Needs: Patient Unable To Answer (05/20/2024)  Utilities: Patient Unable To Answer (05/20/2024)  Social Connections: Unknown (10/04/2022)   Received from [facility]  Stress: No Stress Concern Present (04/16/2024)   Received from [facility]  Tobacco Use: Medium Risk (06/18/2024)    Readmission Risk Interventions    05/20/2024    4:16 PM 03/31/2024   11:27 AM 11/17/2021   12:15 PM  Readmission Risk Prevention Plan  Transportation Screening Complete Complete Complete  Medication Review [employer]) Complete Complete Complete  PCP or Specialist appointment within 3-5 days of discharge Complete  Complete  HRI or Home Care Consult Complete Complete Complete  SW Recovery Care/Counseling Consult Complete Complete Complete  Palliative Care Screening Complete Not Applicable Not Applicable  Skilled Nursing Facility Complete Not Applicable Patient Refused

## 2024-08-05 NOTE — Progress Notes (Signed)
 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  Patient was noted to have hemorrhagic stroke.  Neurology, neurosurgery in critical care were consulted.  EVD was placed on the left side and was initially admitted to neuro ICU.  She was also intubated and had prolonged intubation.  Subsequently, patient was extubated on 05/27/2024.  Important events:  9/15 admitted with ICH plus IVH, EVD was placed for hydrocephalus, intubated 9/22 palliative care meeting.  Family is insistent that they want full scope of care 9/23 Extubated 10/1 -developed fevers pancultures drawn and broad-spectrum antibiotics started 10/2 became afebrile after starting antibiotics, did not tolerate raising EVD 10/6 EVD was raised from 10 to 30 cm water , tolerating well, remained afebrile 10/7 EVD is at 30 cm water , CT scan will be done tomorrow morning, no overnight issues 10/8 she became somnolent, CT head showed increasing hydrocephalus, EVD was titrated down to 10 cm of water  with improvement in mental status 10/15 VP shunt placement 10/17 transferred to progressive unit with [name]  11/18-medically stable.  Awaiting for disposition.   Assessment/Plan: Principal Problem:   Hemorrhagic stroke [facility]) Active Problems:   Moderate protein-calorie malnutrition   Pressure injury of skin   Nontraumatic subcortical hemorrhage of right cerebral hemisphere [facility])  Acute right parietotemporal intraparenchymal hemorrhage / Intraventricular hemorrhage: Uncontrolled hypertension: Obstructive hydrocephalus s/p EVD  changed to VP shunt: Seizure disorder S/p VP shunt placement on 06/18/2024 by Dr. Mavis.  Continue Vimpat .  Neurosurgery intermittently followed the patient during hospitalization.  Currently stable   ESRD on dialysis: History of failed renal transplant: Secondary hyperparathyroidism: Nephrology on board.  Continue CellCept  and prednisone .  On Monday, Wednesday, Friday hemodialysis schedule.  Will need outpatient hemodialysis set up  Acute bronchitis with fever: Resolved   History of pancreatic/kidney transplantation:  Continue cyclosporine , mycophenolate , and prednisone .   Hypertension:  Currently on Coreg , amlodipine , hydralazin and clonidine  on nondialysis days.  BP is stable.  Anemia of chronic disease:  Latest hemoglobin 8.6.Transfuse if Hb below 7.0   Type 1 diabetes with vasculopathy/gastroparesis:  Not on long-term insulin  anymore due to pancreatic transplant. On sliding scale at this time.   Moderate protein calorie malnutrition/dysphagia:  On PEG tube feeding.  Patient has been advanced on regular diet with some oral intake.  Had some abdominal discomfort yesterday but abdominal x-ray was negative.  PPI was started recently.  Will add simethicone  for gas.  Stage I sacral pressure ulcer present on admission: Continue wound care   Debility/deconditioning/disposition/goals of care:  Palliative care had seen the patient during hospitalization.  Family has insisted full code.   Palliative care has signed off 10/17.  Disposition pending.  TOC on board.    DVT prophylaxis: SCDs Start: 05/19/24 1724   Disposition: Skilled nursing facility.  Medically stable for disposition status is: Inpatient  Remains inpatient appropriate because: Awaiting for disposition to skilled nursing facility, TOC on board.    Code Status:     Code Status: Full Code  Family Communication:  Spoke with the patient's mother at bedside  08/05/2024  Consultants: Nephrology Neurosurgery  Procedures: Ventriculoperitoneal shunt placement Hemodialysis  Anti-infectives:  None currently  Subjective: Today, patient was seen and examined at bedside.  Had some abdominal discomfort stopped after PT yesterday.  Complains of flatulence.  Denies any pain today.  Has had bowel movements.  Patient's mother at bedside  Vitals:   08/05/24 0533 08/05/24 0734  BP: (!) 166/63 115/60  Pulse:  85  Resp:  17  Temp: 98.9 F (37.2 C) 98.7 F (37.1 C)  SpO2:  100%    Intake/Output Summary (Last 24 hours) at 08/05/2024 0931 Last data filed at 08/05/2024 0800 Gross per 24 hour  Intake 1470 ml  Output 1900 ml  Net -430 ml      Filed Weights   08/04/24 0500 08/05/24 0500  Weight: 58.2 kg 56.4 kg   Body mass index is 22.03 kg/m.   Physical Exam:  General:  Average built, not in obvious distress, alert awake and chronically ill HENT:   No scleral pallor or icterus noted. Oral mucosa is moist.  Chest: Diminished breath sounds bilaterally.   CVS: S1 &S2 heard. No murmur.  Regular rate and rhythm. Abdomen: Soft, nontender, nondistended.  PEG tube in place.  Bowel sounds are heard.   Extremities: No cyanosis, clubbing or edema.  Peripheral pulses are palpable.  Left upper extremity AV fistula. Psych: Alert, awake and oriented, normal mood CNS:  No cranial nerve deficits.  Moves all extremities, generalized weakness noted Skin: Warm and dry.  No rashes noted.  Data Review: I have personally reviewed the following laboratory data and studies,  CBC: Recent Labs  Lab 08/01/24 0845 08/04/24 0729  WBC 4.0 5.0  HGB 7.8* 8.6*  HCT 24.2* 26.9*  MCV 99.2 99.6  PLT 154 146*   Basic Metabolic Panel: Recent Labs  Lab 08/04/24 0729  NA 130*  K 5.0  CL 88*  CO2 27  GLUCOSE 112*  BUN 90*  CREATININE 5.44*  CALCIUM  10.2  PHOS 4.8*   Liver Function Tests: Recent Labs  Lab 08/04/24 0729  ALBUMIN  2.7*   No results  for input(s): LIPASE, AMYLASE in the last 168 hours. No results for input(s): AMMONIA in the last 168 hours. Cardiac Enzymes: No results for input(s): CKTOTAL, CKMB, CKMBINDEX, TROPONINI in the last 168 hours. BNP (last 3 results) Recent Labs    02/14/24 0949  BNP 1,486.6*    ProBNP (last 3 results) No results for input(s): PROBNP in the last 8760 hours.  CBG: Recent Labs  Lab 08/03/24 0813 08/03/24 1647 08/04/24 0605 08/04/24 2217 08/05/24 0626  GLUCAP 90 181* 109* 129* 113*   No results found for this or any previous visit (from the past 240 hours).   Studies: DG Abd 1 View Result Date: 08/04/2024 CLINICAL DATA:  Abdominal pain EXAM: ABDOMEN - 1 VIEW COMPARISON:  None abdominal x-ray 04/01/2024 FINDINGS: Right-sided VP shunt catheter is present overlying the abdomen. Likely gastrojejunostomy tube present. No dilated bowel loops are seen. There are no suspicious calcifications. There are prominent vascular calcifications in the pelvis. Visualized lung bases are clear. IMPRESSION: Nonobstructive bowel gas pattern. Electronically Signed   By: Greig Pique M.D.   On: 08/04/2024 20:51       Vernal Alstrom, MD  [name] 08/05/2024  If 7PM-7AM, please contact night-coverage

## 2024-08-05 NOTE — Progress Notes (Signed)
 Patient ID: [name], female   DOB: [birth year], 43 y.o.   MRN: [id number] S: no complaints, seen in room, had UF 1.9L with HD yesterday - tx ~45min short due to bending arms causing alarms and ultimately clotted filter. Mom bedside, said she had some epigastric pain last night with unrevealing AXR - thinks it's because she was doing stretching and it's better today  O:BP 115/60 (BP Location: Left Arm)   Pulse 85   Temp 98.7 F (37.1 C) (Oral)   Resp 17   Ht 5' 3 (1.6 m)   Wt 56.4 kg   SpO2 100%   BMI 22.03 kg/m   Intake/Output Summary (Last 24 hours) at 08/05/2024 0916 Last data filed at 08/05/2024 0800 Gross per 24 hour  Intake 1470 ml  Output 1900 ml  Net -430 ml   Intake/Output: I/O last 3 completed shifts: In: 4427.3 [P.O.:630; NG/GT:3797.3] Out: 1900 [Other:1900]  Intake/Output this shift:  Total I/O In: 90 [NG/GT:90] Out: -  Weight change: -1.8 kg Gen: NAd CVS: RRR Resp:CTA Abd: +BS, soft, NT/ND Ext: no edema, RUE AVF +T/B  Recent Labs  Lab 08/04/24 0729  NA 130*  K 5.0  CL 88*  CO2 27  GLUCOSE 112*  BUN 90*  CREATININE 5.44*  ALBUMIN  2.7*  CALCIUM  10.2  PHOS 4.8*   Liver Function Tests: Recent Labs  Lab 08/04/24 0729  ALBUMIN  2.7*   No results for input(s): LIPASE, AMYLASE in the last 168 hours. No results for input(s): AMMONIA in the last 168 hours. CBC: Recent Labs  Lab 08/01/24 0845 08/04/24 0729  WBC 4.0 5.0  HGB 7.8* 8.6*  HCT 24.2* 26.9*  MCV 99.2 99.6  PLT 154 146*   Cardiac Enzymes: No results for input(s): CKTOTAL, CKMB, CKMBINDEX, TROPONINI in the last 168 hours. CBG: Recent Labs  Lab 08/03/24 0813 08/03/24 1647 08/04/24 0605 08/04/24 2217 08/05/24 0626  GLUCAP 90 181* 109* 129* 113*    Iron  Studies: No results for input(s): IRON , TIBC, TRANSFERRIN, FERRITIN in the last 72 hours. Studies/Results: DG Abd 1 View Result Date: 08/04/2024 CLINICAL DATA:  Abdominal pain EXAM: ABDOMEN - 1 VIEW  COMPARISON:  None abdominal x-ray 04/01/2024 FINDINGS: Right-sided VP shunt catheter is present overlying the abdomen. Likely gastrojejunostomy tube present. No dilated bowel loops are seen. There are no suspicious calcifications. There are prominent vascular calcifications in the pelvis. Visualized lung bases are clear. IMPRESSION: Nonobstructive bowel gas pattern. Electronically Signed   By: Greig Pique M.D.   On: 08/04/2024 20:51    carvedilol   25 mg Per Tube BID WC   cinacalcet   30 mg Oral Q M,W,F   cycloSPORINE   200 mg Per Tube QPM   cycloSPORINE   225 mg Per Tube Daily   darbepoetin (ARANESP ) injection - DIALYSIS  200 mcg Subcutaneous Q Thu-1800   famotidine   10 mg Per Tube QHS   feeding supplement (NEPRO CARB STEADY)  1,000 mL Per Tube Q24H   fiber supplement (BANATROL TF)  60 mL Per Tube TID   gabapentin   100 mg Per Tube Daily   Gerhardt's butt cream   Topical BID   hydrALAZINE   25 mg Oral Q8H   hydrocortisone    Rectal TID    HYDROmorphone  (DILAUDID ) injection  0.25 mg Intravenous Once   lacosamide   100 mg Per Tube BID   liver oil-zinc  oxide   Topical TID   losartan   25 mg Per Tube Q1200   multivitamin with minerals  1 tablet Per Tube Daily  mycophenolate   500 mg Per Tube BID   mouth rinse  15 mL Mouth Rinse 4 times per day   oxidized cellulose  1 each Topical Once   pantoprazole  (PROTONIX ) IV  40 mg Intravenous Daily   predniSONE   15 mg Per Tube Q breakfast    BMET    Component Value Date/Time   NA 130 (L) 08/04/2024 0729   K 5.0 08/04/2024 0729   CL 88 (L) 08/04/2024 0729   CO2 27 08/04/2024 0729   GLUCOSE 112 (H) 08/04/2024 0729   BUN 90 (H) 08/04/2024 0729   CREATININE 5.44 (H) 08/04/2024 0729   CALCIUM  10.2 08/04/2024 0729   GFRNONAA 9 (L) 08/04/2024 0729   GFRAA  02/02/2022 9167    PATIENT IDENTIFICATION ERROR. PLEASE DISREGARD RESULTS. ACCOUNT WILL BE CREDITED.   CBC    Component Value Date/Time   WBC 5.0 08/04/2024 0729   RBC 2.70 (L) 08/04/2024 0729    HGB 8.6 (L) 08/04/2024 0729   HCT 26.9 (L) 08/04/2024 0729   PLT 146 (L) 08/04/2024 0729   MCV 99.6 08/04/2024 0729   MCH 31.9 08/04/2024 0729   MCHC 32.0 08/04/2024 0729   RDW 15.9 (H) 08/04/2024 0729   LYMPHSABS 0.9 06/24/2024 0827   MONOABS 0.5 06/24/2024 0827   EOSABS 0.1 06/24/2024 0827   BASOSABS 0.0 06/24/2024 0827    Dialysis Orders: 3:45hr, 400/A1.5, EDW 55.2kg, 2K/2Ca bath, AVF, no heparin  - Prev on Mircera 150mcg IV q 2 weeks - No VDRA   Assessment/Plan: Acute R intracranial hemorrhage: 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 Needs ongoing rehab. Doing better.  ESRD:  HD on MWF.  HD next tomorrow.  HTN/volume: BP trending back up- has had some hypotension with HD. UF as tolerated. Off amlodipine , now on Coreg  25mg  bid, Losartan  25 mg. Has been tapered off clonidine .   Hydralazine  25mg  TID started by PMD Anemia of ESRD: Hgb 8s, transfuse prn. Continue Aranesp  200 q Thurs Secondary HPTH: CorrCa high, phos in goal. Continue sensipar , not on VDRA. Nutrition: Alb low, on nepro TF but has a regular diet ordered too. RD note 11/28 cal count intervention is to continue TF for now  - on another cal count currently Hx pancreas/kidney transplant: Remains on IS for pancreas function. Dispo: Insurance declined LTAC, and SNF, appeal pending. Tolerated HD in recliner on multiple days   Manuelita Barters MD [facility] Pager [phone]

## 2024-08-05 NOTE — Progress Notes (Signed)
 Physical Therapy Treatment Patient Details Name: [name] [name] MRN: [id number] DOB: [birth year] Today's Date: 08/05/2024   History of Present Illness 43 yo female presents 05/19/24 for left gaze and L side weakness. 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 Intubated 9/15-9/23. EVD 9/15- 10/15; s/p shunt 10/15. S/p laparoscopy with intra-abdominal assistance ventricular-peritoneal shunt placement 10/15. PMH: HTN, ESRD on HD s/p renal and pancreatic transplants, DM, gastroparesis, multiple strokes (ischemic and hemorrhagic) with prepontine SAH, hydrocephalus requiring EVD placement    PT Comments  Pt received in supine and agreeable to PT session. She repeatedly states I am tired/exhausted, and educated on benefits of mobilization and improving activity tolerance. Pt benefits from inc verbal encouragement and making a target for her to walk to, and able to perform STS and ambulating 23' then 12' CGA with RW and chair follow. Suspect we could have ambulated further, but limited by bout of emesis in the hallway while in sitting. Noted an overall weak core, as pt with great difficulty shifting trunk anteriorly both supine in bed and in sitting. Continuing to recommend post-acute rehab <3hrs/day to improve activity tolerance and safety with functional mobility. Acute PT to follow.      If plan is discharge home, recommend the following: A lot of help with walking and/or transfers;A lot of help with bathing/dressing/bathroom;Assistance with cooking/housework;Direct supervision/assist for medications management;Direct supervision/assist for financial management;Assist for transportation;Help with stairs or ramp for entrance   Can travel by private vehicle     No  Equipment Recommendations  Hospital bed;Hoyer lift;Wheelchair (measurements PT);Wheelchair cushion (measurements  PT);BSC/3in1    Recommendations for Other Services       Precautions / Restrictions Precautions Precautions: Fall Recall of Precautions/Restrictions: Impaired Restrictions Weight Bearing Restrictions Per Provider Order: No     Mobility  Bed Mobility Overal bed mobility: Needs Assistance Bed Mobility: Supine to Sit     Supine to sit: Min assist     General bed mobility comments: Difficulty initiating supine>sit, but only required minA and VC for hand placement to perform    Transfers Overall transfer level: Needs assistance Equipment used: Rolling walker (2 wheels) Transfers: Sit to/from Stand Sit to Stand: Contact guard assist           General transfer comment: Performed several STS throughout session. Pt slow to rise, but only required CGA for safety. Seems to benefit from heavy verbal encouragement.    Ambulation/Gait Ambulation/Gait assistance: +2 safety/equipment, Contact guard assist Gait Distance (Feet): 23 Feet (+12) Assistive device: Rolling walker (2 wheels) Gait Pattern/deviations: Step-through pattern, Decreased stride length, Trunk flexed, Shuffle Gait velocity: Dec Gait velocity interpretation: <1.8 ft/sec, indicate of risk for recurrent falls   General Gait Details: Pt with trunk flexion and slowed gait. Attempted once to sit in chair following her before verbalizing that she was fatigued, but once cued was able to let therapists know when she was tired so she could sit. Benefits from inc verbal encouragement and creating a target for her to reach. Had one bout of emesis while sitting in chair into emesis bag. States she was slightly nauseous upon sitting up in bed   Stairs             Wheelchair Mobility     Tilt Bed    Modified Rankin (Stroke Patients Only) Modified Rankin (Stroke Patients Only) Pre-Morbid Rankin Score: Moderately severe disability Modified Rankin: Moderately severe disability  Balance Overall balance  assessment: Needs assistance Sitting-balance support: No upper extremity supported, Feet supported Sitting balance-Leahy Scale: Good     Standing balance support: Bilateral upper extremity supported, During functional activity, Reliant on assistive device for balance Standing balance-Leahy Scale: Poor Standing balance comment: reliant on RW support                            Communication Communication Communication: Impaired Factors Affecting Communication: Reduced clarity of speech  Cognition Arousal: Alert Behavior During Therapy: WFL for tasks assessed/performed   PT - Cognitive impairments: Initiation, Safety/Judgement, Problem solving, Sequencing                       PT - Cognition Comments: Pt demonstrating difficulty intiating movement during bed mobility, and slowed response throughout session. Pt states repeatedly that she is very tired and that she does not want to mobilize, but was willing to work with therapy after discussing benefits of mobility. Following commands: Impaired Following commands impaired: Follows one step commands with increased time, Follows multi-step commands inconsistently    Cueing Cueing Techniques: Verbal cues, Gestural cues  Exercises      General Comments General comments (skin integrity, edema, etc.): Pt requires heavy encouragement and education on importance of mobilizing, and Mom is a helpful motivator. Suspect we could have ambulated further if not for bout of emesis      Pertinent Vitals/Pain Pain Assessment Pain Assessment: Faces Faces Pain Scale: No hurt Pain Intervention(s): Monitored during session    Home Living                          Prior Function            PT Goals (current goals can now be found in the care plan section) Acute Rehab PT Goals PT Goal Formulation: With patient/family Time For Goal Achievement: 08/19/24 Potential to Achieve Goals: Fair Progress towards PT goals:  Progressing toward goals    Frequency    Min 2X/week      PT Plan      Co-evaluation              AM-PAC PT 6 Clicks Mobility   Outcome Measure  Help needed turning from your back to your side while in a flat bed without using bedrails?: A Little Help needed moving from lying on your back to sitting on the side of a flat bed without using bedrails?: A Lot Help needed moving to and from a bed to a chair (including a wheelchair)?: A Little Help needed standing up from a chair using your arms (e.g., wheelchair or bedside chair)?: A Little Help needed to walk in hospital room?: A Little Help needed climbing 3-5 steps with a railing? : A Lot 6 Click Score: 16    End of Session Equipment Utilized During Treatment: Gait belt Activity Tolerance: Patient tolerated treatment well Patient left: in chair;with call bell/phone within reach;with chair alarm set;with family/visitor present Nurse Communication: Mobility status PT Visit Diagnosis: Unsteadiness on feet (R26.81);Other abnormalities of gait and mobility (R26.89);Muscle weakness (generalized) (M62.81);Difficulty in walking, not elsewhere classified (R26.2);Other symptoms and signs involving the nervous system (R29.898)     Time: 8889-8857 PT Time Calculation (min) (ACUTE ONLY): 32 min  Charges:    $Therapeutic Activity: 23-37 mins PT General Charges $$ ACUTE PT VISIT: 1 Visit  Breaker Springer, SPT    Goebel Hellums 08/05/2024, 1:33 PM

## 2024-08-06 ENCOUNTER — Inpatient Hospital Stay (HOSPITAL_COMMUNITY)

## 2024-08-06 LAB — GLUCOSE, CAPILLARY
Glucose-Capillary: 99 mg/dL (ref 70–99)
Glucose-Capillary: 124 mg/dL — ABNORMAL HIGH (ref 70–99)
Glucose-Capillary: 151 mg/dL — ABNORMAL HIGH (ref 70–99)

## 2024-08-06 MED ORDER — NEPRO/CARBSTEADY PO LIQD
1000.0000 mL | ORAL | Status: DC
  Administered 2024-08-06 – 2024-08-07 (×2): 1000 mL
  Filled 2024-08-06 (×2): qty 1000

## 2024-08-06 NOTE — Progress Notes (Addendum)
 9479 patient complaining of increased gas pain, stomach pain, & feeling full. Similar episode earlier in my shift, simethicone  & tylenol  given with little effect. Notified Dr. Franky. TF stopped PRNS given, & KUB obtained.  Of note patient has had little appetitive since given a regular diet & TF going.

## 2024-08-06 NOTE — Plan of Care (Signed)
  Problem: Fluid Volume: Goal: Ability to maintain a balanced intake and output will improve Outcome: Progressing   Problem: Health Behavior/Discharge Planning: Goal: Ability to identify and utilize available resources and services will improve Outcome: Progressing Goal: Ability to manage health-related needs will improve Outcome: Progressing   Problem: Metabolic: Goal: Ability to maintain appropriate glucose levels will improve Outcome: Progressing   Problem: Nutritional: Goal: Maintenance of adequate nutrition will improve Outcome: Progressing Goal: Progress toward achieving an optimal weight will improve Outcome: Progressing   Problem: Skin Integrity: Goal: Risk for impaired skin integrity will decrease Outcome: Progressing   Problem: Tissue Perfusion: Goal: Adequacy of tissue perfusion will improve Outcome: Progressing   Problem: Education: Goal: Knowledge of disease or condition will improve Outcome: Progressing Goal: Knowledge of secondary prevention will improve (MUST DOCUMENT ALL) Outcome: Progressing Goal: Knowledge of patient specific risk factors will improve (DELETE if not current risk factor) Outcome: Progressing   Problem: Intracerebral Hemorrhage Tissue Perfusion: Goal: Complications of Intracerebral Hemorrhage will be minimized Outcome: Progressing   Problem: Coping: Goal: Will verbalize positive feelings about self Outcome: Progressing Goal: Will identify appropriate support needs Outcome: Progressing   Problem: Health Behavior/Discharge Planning: Goal: Ability to manage health-related needs will improve Outcome: Progressing Goal: Goals will be collaboratively established with patient/family Outcome: Progressing   Problem: Self-Care: Goal: Ability to participate in self-care as condition permits will improve Outcome: Progressing Goal: Verbalization of feelings and concerns over difficulty with self-care will improve Outcome: Progressing Goal:  Ability to communicate needs accurately will improve Outcome: Progressing   Problem: Nutrition: Goal: Risk of aspiration will decrease Outcome: Progressing Goal: Dietary intake will improve Outcome: Progressing   Problem: Education: Goal: Knowledge of General Education information will improve Description: Including pain rating scale, medication(s)/side effects and non-pharmacologic comfort measures Outcome: Progressing   Problem: Health Behavior/Discharge Planning: Goal: Ability to manage health-related needs will improve Outcome: Progressing   Problem: Clinical Measurements: Goal: Ability to maintain clinical measurements within normal limits will improve Outcome: Progressing Goal: Will remain free from infection Outcome: Progressing Goal: Diagnostic test results will improve Outcome: Progressing Goal: Respiratory complications will improve Outcome: Progressing Goal: Cardiovascular complication will be avoided Outcome: Progressing   Problem: Activity: Goal: Risk for activity intolerance will decrease Outcome: Progressing   Problem: Nutrition: Goal: Adequate nutrition will be maintained Outcome: Progressing   Problem: Coping: Goal: Level of anxiety will decrease Outcome: Progressing   Problem: Elimination: Goal: Will not experience complications related to bowel motility Outcome: Progressing   Problem: Pain Managment: Goal: General experience of comfort will improve and/or be controlled Outcome: Progressing   Problem: Safety: Goal: Ability to remain free from injury will improve Outcome: Progressing   Problem: Skin Integrity: Goal: Risk for impaired skin integrity will decrease Outcome: Progressing   Problem: Education: Goal: Knowledge of the prescribed therapeutic regimen will improve Outcome: Progressing   Problem: Clinical Measurements: Goal: Usual level of consciousness will be regained or maintained. Outcome: Progressing Goal: Neurologic status  will improve Outcome: Progressing Goal: Ability to maintain intracranial pressure will improve Outcome: Progressing   Problem: Skin Integrity: Goal: Demonstration of wound healing without infection will improve Outcome: Progressing

## 2024-08-06 NOTE — Progress Notes (Signed)
 [name]  PROGRESS NOTE  [name] [name] FMW:[id number] DOB: [birth year] DOA: 05/19/2024 PCP: [facility]   Brief HPI:    43-yrs-old female with past medical history of hypertension, CVA, type 1 diabetes, gastroparesis, kidney and pancreatic transplant 2014 with subsequent failure of renal transplant now on end-stage renal dialysis, anxiety, narcotic abuse presented to the hospital from dialysis unit on 05/19/2024 as code stroke.  Patient was noted to have hemorrhagic stroke.  Neurology, neurosurgery in critical care were consulted.  EVD was placed on the left side and was initially admitted to neuro ICU.  She was also intubated and had prolonged intubation.  Subsequently, patient was extubated on 05/27/2024.   Important events:  9/15 admitted with ICH plus IVH, EVD was placed for hydrocephalus, intubated 9/22 palliative care meeting.  Family is insistent that they want full scope of care 9/23 Extubated 10/1 -developed fevers pancultures drawn and broad-spectrum antibiotics started 10/2 became afebrile after starting antibiotics, did not tolerate raising EVD 10/6 EVD was raised from 10 to 30 cm water , tolerating well, remained afebrile 10/7 EVD is at 30 cm water , CT scan will be done tomorrow morning, no overnight issues 10/8 she became somnolent, CT head showed increasing hydrocephalus, EVD was titrated down to 10 cm of water  with improvement in mental status 10/15 VP shunt placement 10/17 transferred to progressive unit with [name]  11/18-medically stable.  Awaiting for disposition.  Insurance declined LTAC and skilled nursing facility, appeal pending.    Assessment/Plan:   Acute right parietotemporal intraparenchymal hemorrhage / Intraventricular hemorrhage: Uncontrolled hypertension: Obstructive hydrocephalus s/p EVD changed to VP shunt: Seizure disorder S/p VP shunt placement on 06/18/2024 by Dr. Mavis.  Continue Vimpat .   Neurosurgery intermittently followed the  patient during hospitalization.   Currently stable   ESRD on dialysis: History of failed renal transplant: Secondary hyperparathyroidism: Nephrology on board.  Continue CellCept  and prednisone .   On Monday, Wednesday, Friday hemodialysis schedule.   TOC is working on finding outpatient hemodialysis set up  Hyponatremia - Chronic, nephrology following -Patient on hemodialysis    History of pancreatic/kidney transplantation:  Continue cyclosporine , mycophenolate , and prednisone .   Hypertension:  -Blood pressure is stable -Continue Coreg , amlodipine , hydralazine  -Continue clonidine  on non hemodialysis days   Anemia of chronic disease:  Latest hemoglobin  8.5.. Transfuse if Hb below 7.0   Type 1 diabetes with vasculopathy/gastroparesis:  Not on long-term insulin  anymore due to pancreatic transplant.  -She is currently n.p.o. for swallow evaluation/MBS Check CBG every 8 hours   Moderate protein calorie malnutrition/dysphagia:  On PEG tube feeding..   Advance to regular diet with oral intake. Continue PPI, simethicone  for abdominal discomfort    Debility/deconditioning/disposition/goals of care:  Palliative care had seen the patient during hospitalization.  Family has insisted full code.   Palliative care has signed off 10/17.  Disposition pending.  TOC on board.  Patient has been seen by physical therapy during hospitalization and patient requiring significant help      DVT prophylaxis: SCDs  Medications     carvedilol   25 mg Per Tube BID WC   cinacalcet   30 mg Oral Q M,W,F   cycloSPORINE   200 mg Per Tube QPM   cycloSPORINE   225 mg Per Tube Daily   darbepoetin (ARANESP ) injection - DIALYSIS  200 mcg Subcutaneous Q Thu-1800   famotidine   10 mg Per Tube QHS   feeding supplement (NEPRO CARB STEADY)  1,000 mL Per Tube Q24H   fiber supplement (BANATROL TF)  60 mL Per Tube  TID   gabapentin   100 mg Per Tube Daily   Gerhardt's butt cream   Topical BID   hydrALAZINE   25  mg Per Tube Q8H   hydrocortisone    Rectal TID    HYDROmorphone  (DILAUDID ) injection  0.25 mg Intravenous Once   lacosamide   100 mg Per Tube BID   liver oil-zinc  oxide   Topical TID   losartan   25 mg Per Tube Q1200   multivitamin with minerals  1 tablet Per Tube Daily   mycophenolate   500 mg Per Tube BID   mouth rinse  15 mL Mouth Rinse 4 times per day   oxidized cellulose  1 each Topical Once   pantoprazole  (PROTONIX ) IV  40 mg Intravenous Daily   predniSONE   15 mg Per Tube Q breakfast     Data Reviewed:   CBG:  Recent Labs  Lab 08/04/24 2217 08/05/24 0626 08/05/24 1612 08/05/24 2212 08/06/24 0607  GLUCAP 129* 113* 170* 128* 99    SpO2: 100 % O2 Flow Rate (L/min): 100 L/min FiO2 (%): 40 %    Vitals:   08/06/24 0417 08/06/24 0500 08/06/24 0523 08/06/24 0733  BP: (!) 171/64  (!) 169/72 (!) 157/70  Pulse:    87  Resp:    18  Temp:    97.8 F (36.6 C)  TempSrc:    Oral  SpO2:    100%  Weight:  56.5 kg    Height:          Data Reviewed:  Basic Metabolic Panel: Recent Labs  Lab 08/04/24 0729  NA 130*  K 5.0  CL 88*  CO2 27  GLUCOSE 112*  BUN 90*  CREATININE 5.44*  CALCIUM  10.2  PHOS 4.8*    CBC: Recent Labs  Lab 08/01/24 0845 08/04/24 0729  WBC 4.0 5.0  HGB 7.8* 8.6*  HCT 24.2* 26.9*  MCV 99.2 99.6  PLT 154 146*    LFT Recent Labs  Lab 08/04/24 0729  ALBUMIN  2.7*     Antibiotics: Anti-infectives (From admission, onward)    Start     Dose/Rate Route Frequency Ordered Stop   07/08/24 1530  fluconazole  (DIFLUCAN ) tablet 150 mg        150 mg Oral  Once 07/08/24 1437 07/08/24 1544   06/18/24 2030  ceFAZolin  (ANCEF ) IVPB 2g/100 mL premix  Status:  Discontinued        2 g 200 mL/hr over 30 Minutes Intravenous Every 8 hours 06/18/24 1454 06/19/24 1608   06/18/24 0600  ceFAZolin  (ANCEF ) IVPB 2g/100 mL premix        2 g 200 mL/hr over 30 Minutes Intravenous On call to O.R. 06/15/24 1152 06/18/24 1230   06/06/24 1400   piperacillin -tazobactam (ZOSYN ) IVPB 2.25 g        2.25 g 100 mL/hr over 30 Minutes Intravenous Every 8 hours 06/06/24 1017 06/09/24 0538   06/06/24 1200  vancomycin  (VANCOREADY) IVPB 500 mg/100 mL        500 mg 100 mL/hr over 60 Minutes Intravenous Every M-W-F (Hemodialysis) 06/04/24 1745 06/06/24 1900   06/04/24 1845  vancomycin  (VANCOCIN ) IVPB 1000 mg/200 mL premix        1,000 mg 200 mL/hr over 60 Minutes Intravenous  Once 06/04/24 1745 06/04/24 1915   06/04/24 1830  ceFEPIme  (MAXIPIME ) 2 g in sodium chloride  0.9 % 100 mL IVPB  Status:  Discontinued        2 g 200 mL/hr over 30 Minutes Intravenous Every M-W-F (Hemodialysis) 06/04/24 1743  06/06/24 1017   05/27/24 1015  cefTRIAXone  (ROCEPHIN ) 2 g in sodium chloride  0.9 % 100 mL IVPB        2 g 200 mL/hr over 30 Minutes Intravenous Every 24 hours 05/27/24 0918 05/29/24 1014   05/23/24 1530  ceFEPIme  (MAXIPIME ) 1 g in sodium chloride  0.9 % 100 mL IVPB  Status:  Discontinued        1 g 200 mL/hr over 30 Minutes Intravenous Every 24 hours 05/23/24 1433 05/27/24 0918   05/22/24 2200  Ampicillin -Sulbactam (UNASYN ) 3 g in sodium chloride  0.9 % 100 mL IVPB  Status:  Discontinued        3 g 200 mL/hr over 30 Minutes Intravenous Every 24 hours 05/22/24 0834 05/23/24 1433   05/22/24 0930  Ampicillin -Sulbactam (UNASYN ) 3 g in sodium chloride  0.9 % 100 mL IVPB        3 g 200 mL/hr over 30 Minutes Intravenous  Once 05/22/24 0834 05/22/24 1900        CONSULTS neurosurgery, palliative care  Code Status: Full code  Family Communication:      Subjective   Seen during hemodialysis.  No new complaints   Objective    Physical Examination:  Appears in no acute distress S1-S2, regular Abdomen is soft, nontender, no organomegaly Extremities no edema         Silver Achey S Amiaya Mcneeley   [name] If 7PM-7AM, please contact night-coverage at [email or web address], Office  [phone]   08/06/2024, 8:22 AM  LOS: 79 days

## 2024-08-06 NOTE — Progress Notes (Signed)
  Received patient in bed to unit.   Informed consent signed and in chart.    TX duration:3.5      Transported by  Hand-off given to patient's nurse.    Access used: RT AVF Access issues: none   Total UF removed: 1900 Medication(s) given: None Post HD VS:  132/62   Hunter Hacking  LPN Kidney Dialysis Unit

## 2024-08-06 NOTE — Plan of Care (Signed)
  Problem: Nutritional: Goal: Maintenance of adequate nutrition will improve Outcome: Progressing Goal: Progress toward achieving an optimal weight will improve Outcome: Progressing   Problem: Self-Care: Goal: Ability to participate in self-care as condition permits will improve Outcome: Progressing Goal: Verbalization of feelings and concerns over difficulty with self-care will improve Outcome: Progressing Goal: Ability to communicate needs accurately will improve Outcome: Progressing   Problem: Nutrition: Goal: Risk of aspiration will decrease Outcome: Progressing Goal: Dietary intake will improve Outcome: Progressing   Problem: Activity: Goal: Risk for activity intolerance will decrease Outcome: Progressing   Problem: Coping: Goal: Level of anxiety will decrease Outcome: Progressing   Problem: Pain Managment: Goal: General experience of comfort will improve and/or be controlled Outcome: Progressing

## 2024-08-06 NOTE — Progress Notes (Addendum)
 MD Lama paged. Patient stating she doesn't want to be hooked to tube feeds because she feels full. Medication administered through PEG tube; patient threw up right after administration (less than 5 min after).   Will administer zofran .  Tube feed held since 8am prior to HD treatment.

## 2024-08-06 NOTE — Progress Notes (Signed)
 OT Cancellation Note  Patient Details Name: [name] [name] MRN: [id number] DOB: 10/30/1980   Cancelled Treatment:    Reason Eval/Treat Not Completed: Other (comment) (HD)   Justyce Baby M. [place], OTR/L [facility] [phone] Secure Chat Preferred  Shahil Speegle 08/06/2024, 9:57 AM

## 2024-08-06 NOTE — Progress Notes (Signed)
 Occupational Therapy Treatment Patient Details Name: [name] [name] MRN: [id number] DOB: [birth year] Today's Date: 08/06/2024   History of present illness 43 yo female presents 05/19/24 for left gaze and L side weakness. 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 Intubated 9/15-9/23. EVD 9/15- 10/15; s/p shunt 10/15. S/p laparoscopy with intra-abdominal assistance ventricular-peritoneal shunt placement 10/15. PMH: HTN, ESRD on HD s/p renal and pancreatic transplants, DM, gastroparesis, multiple strokes (ischemic and hemorrhagic) with prepontine SAH, hydrocephalus requiring EVD placement   OT comments  Pt seen today for OT treatment, goals updated per OT POC. Supportive mother at bedside. Pt making good progress today, despite having HD earlier in the AM. She demo's improved L attention, although still with cognitive deficits related to general attention, multi-step command following, and problem solving. Benefits from encouragement as she perseverates on feeling tired. Functionally, she completed LB dressing with min A (sequencing cues) and IADL tasks in room with CGA when reaching outside BOS. Still requiring min A for functional transfers and CGA-min A for ambulation in room with RW. Moderately fatigued at end of visit. OT to continue to follow.      If plan is discharge home, recommend the following:  Assistance with cooking/housework;Assist for transportation;Help with stairs or ramp for entrance;A little help with walking and/or transfers;Direct supervision/assist for medications management;Direct supervision/assist for financial management;Supervision due to cognitive status;A little help with bathing/dressing/bathroom   Equipment Recommendations  BSC/3in1    Recommendations for Other Services      Precautions / Restrictions Precautions Precautions: Fall Recall of  Precautions/Restrictions: Impaired Precaution/Restrictions Comments: PEG Restrictions Weight Bearing Restrictions Per Provider Order: No       Mobility Bed Mobility Overal bed mobility: Needs Assistance Bed Mobility: Supine to Sit, Sit to Supine     Supine to sit: Min assist, Used rails, HOB elevated Sit to supine: Contact guard assist, HOB elevated, Used rails   General bed mobility comments: VC for reaching bed rail, hand-over-hand to problem solve where to reach, needs A for trunk elevation, exited to the L side    Transfers Overall transfer level: Needs assistance Equipment used: Rolling walker (2 wheels) Transfers: Sit to/from Stand Sit to Stand: Contact guard assist           General transfer comment: Stood from bed height with VC for hand placement and powering up to RW.     Balance Overall balance assessment: Needs assistance Sitting-balance support: No upper extremity supported, Feet supported Sitting balance-Leahy Scale: Good Sitting balance - Comments: seated EOB, no LOB observed   Standing balance support: Bilateral upper extremity supported, During functional activity, Reliant on assistive device for balance Standing balance-Leahy Scale: Poor Standing balance comment: reliant on RW support                           ADL either performed or assessed with clinical judgement   ADL Overall ADL's : Needs assistance/impaired     Grooming: Contact guard assist;Wash/dry hands;Standing Grooming Details (indicate cue type and reason): sinkside; occasional sequencing cues and to locate paper towels for mirror cleaning IADL task.             Lower Body Dressing: Minimal assistance;Cueing for sequencing;Sitting/lateral leans;Sit to/from stand Lower Body Dressing Details (indicate cue type and reason): donned B socks bed level via figure four method, x3 attempts to thread LE's through underwear bed  level but pt unsuccessful, needs sequencing cues and  visual cues to properly thread LE's through correct leg holes while at EOB Toilet Transfer: Minimal assistance;Ambulation;Regular Toilet;Rolling walker (2 wheels);Grab bars Toilet Transfer Details (indicate cue type and reason): VC to reach for GBs to assist and for optimal body mechanics to pull up from toilet.         Functional mobility during ADLs: Contact guard assist;Minimal assistance;Rolling walker (2 wheels) General ADL Comments: CGA for IADL cleaning activity, cues to maintain at least unilateral UE support on sink vanity for improved stability/safety. Able to reach outside BOS to wash mirror with cues for stretching/reaching towards top of mirror for improved use of LUE.    Extremity/Trunk Assessment Upper Extremity Assessment LUE Deficits / Details: low muscle tone throughout x4 extremities            Vision       Perception     Praxis     Communication Communication Communication: No apparent difficulties   Cognition Arousal: Alert Behavior During Therapy: WFL for tasks assessed/performed Cognition: Cognition impaired   Orientation impairments:  (not formally assessed this date) Awareness: Intellectual awareness impaired (improving) Memory impairment (select all impairments): Short-term memory, Working memory Attention impairment (select first level of impairment): Sustained attention Executive functioning impairment (select all impairments): Initiation, Organization, Sequencing, Problem solving OT - Cognition Comments: perseverative on feeling tired, benefits from encouragement                 Following commands: Impaired Following commands impaired: Follows one step commands with increased time, Follows multi-step commands inconsistently      Cueing   Cueing Techniques: Verbal cues, Gestural cues  Exercises      Shoulder Instructions       General Comments supportive mother present    Pertinent Vitals/ Pain       Pain Assessment Pain  Assessment: Faces Faces Pain Scale: No hurt  Home Living                                          Prior Functioning/Environment              Frequency  Min 2X/week        Progress Toward Goals  OT Goals(current goals can now be found in the care plan section)  Progress towards OT goals: Progressing toward goals;Goals updated  Acute Rehab OT Goals Time For Goal Achievement: 08/20/24 ADL Goals Pt Will Perform Grooming: with supervision;standing Pt Will Perform Lower Body Dressing: with supervision;sit to/from stand Pt Will Transfer to Toilet: with contact guard assist;ambulating;regular height toilet;grab bars Additional ADL Goal #1: Pt will follow 2-step commands 75% of the time during ADLs and functional mobility. Additional ADL Goal #2: Pt will sustain attention to ADL task for ~5 minutes with min cues.  Plan      Co-evaluation                 AM-PAC OT 6 Clicks Daily Activity     Outcome Measure   Help from another person eating meals?: A Little Help from another person taking care of personal grooming?: A Little Help from another person toileting, which includes using toliet, bedpan, or urinal?: A Little Help from another person bathing (including washing, rinsing, drying)?: A Lot Help from another person to put on and taking off regular upper body clothing?: A Little Help from  another person to put on and taking off regular lower body clothing?: A Little 6 Click Score: 17    End of Session Equipment Utilized During Treatment: Gait belt;Rolling walker (2 wheels)  OT Visit Diagnosis: Unsteadiness on feet (R26.81);Muscle weakness (generalized) (M62.81);History of falling (Z91.81);Other symptoms and signs involving cognitive function;Cognitive communication deficit (R41.841);Low vision, both eyes (H54.2) Symptoms and signs involving cognitive functions: Nontraumatic SAH   Activity Tolerance Patient tolerated treatment well   Patient  Left in bed;with call bell/phone within reach;with bed alarm set;with family/visitor present   Nurse Communication Mobility status        Time: 8480-8462 OT Time Calculation (min): 18 min  Charges: OT General Charges $OT Visit: 1 Visit OT Treatments $Self Care/Home Management : 8-22 mins  Genevia Bouldin M. [place], OTR/L [facility] Acute Rehabilitation Services [phone] Secure Chat Preferred  [name] [name] 08/06/2024, 5:06 PM

## 2024-08-06 NOTE — Progress Notes (Signed)
 Patient ID: [name] [name], female   DOB: August 18, 1981, 43 y.o.   MRN: [id number] S: no complaints, seen in dialysis, tolerating treatment well. No c/o - denies dyspnea, abd pain.   O:BP (!) 156/76   Pulse 80   Temp 97.8 F (36.6 C) (Oral)   Resp 15   Ht 5' 3 (1.6 m)   Wt 56.5 kg   SpO2 100%   BMI 22.06 kg/m   Intake/Output Summary (Last 24 hours) at 08/06/2024 0835 Last data filed at 08/05/2024 1800 Gross per 24 hour  Intake 810 ml  Output --  Net 810 ml   Intake/Output: I/O last 3 completed shifts: In: 1710 [P.O.:510; Other:120; NG/GT:1080] Out: -   Intake/Output this shift:  No intake/output data recorded. Weight change: 0.1 kg Gen: NAd CVS: RRR Resp:CTA Abd: +BS, soft, NT/ND Ext: no edema, RUE AVF +T/B  Recent Labs  Lab 08/04/24 0729  NA 130*  K 5.0  CL 88*  CO2 27  GLUCOSE 112*  BUN 90*  CREATININE 5.44*  ALBUMIN  2.7*  CALCIUM  10.2  PHOS 4.8*   Liver Function Tests: Recent Labs  Lab 08/04/24 0729  ALBUMIN  2.7*   No results for input(s): LIPASE, AMYLASE in the last 168 hours. No results for input(s): AMMONIA in the last 168 hours. CBC: Recent Labs  Lab 08/01/24 0845 08/04/24 0729  WBC 4.0 5.0  HGB 7.8* 8.6*  HCT 24.2* 26.9*  MCV 99.2 99.6  PLT 154 146*   Cardiac Enzymes: No results for input(s): CKTOTAL, CKMB, CKMBINDEX, TROPONINI in the last 168 hours. CBG: Recent Labs  Lab 08/04/24 2217 08/05/24 0626 08/05/24 1612 08/05/24 2212 08/06/24 0607  GLUCAP 129* 113* 170* 128* 99    Iron  Studies: No results for input(s): IRON , TIBC, TRANSFERRIN, FERRITIN in the last 72 hours. Studies/Results: DG Abd 1 View Result Date: 08/06/2024 EXAM: 1 VIEW XRAY OF THE ABDOMEN 08/06/2024 06:25:24 AM COMPARISON: 08/04/2024 CLINICAL HISTORY: Abdominal pain FINDINGS: LINES, TUBES AND DEVICES: Percutaneous gastrojejunostomy tube stable in position. VP shunt catheter stable in position. BOWEL: Nonobstructive bowel gas pattern. SOFT  TISSUES: Surgical suture in left lower quadrant. Vascular calcifications. No opaque urinary calculi. BONES: No acute osseous abnormality. IMPRESSION: 1. No acute abdominal abnormality identified to explain pain. Electronically signed by: Waddell Calk MD 08/06/2024 06:32 AM EST RP Workstation: [id number]   DG Abd 1 View Result Date: 08/04/2024 CLINICAL DATA:  Abdominal pain EXAM: ABDOMEN - 1 VIEW COMPARISON:  None abdominal x-ray 04/01/2024 FINDINGS: Right-sided VP shunt catheter is present overlying the abdomen. Likely gastrojejunostomy tube present. No dilated bowel loops are seen. There are no suspicious calcifications. There are prominent vascular calcifications in the pelvis. Visualized lung bases are clear. IMPRESSION: Nonobstructive bowel gas pattern. Electronically Signed   By: Greig Pique M.D.   On: 08/04/2024 20:51    carvedilol   25 mg Per Tube BID WC   cinacalcet   30 mg Oral Q M,W,F   cycloSPORINE   200 mg Per Tube QPM   cycloSPORINE   225 mg Per Tube Daily   darbepoetin (ARANESP ) injection - DIALYSIS  200 mcg Subcutaneous Q Thu-1800   famotidine   10 mg Per Tube QHS   feeding supplement (NEPRO CARB STEADY)  1,000 mL Per Tube Q24H   fiber supplement (BANATROL TF)  60 mL Per Tube TID   gabapentin   100 mg Per Tube Daily   Gerhardt's butt cream   Topical BID   hydrALAZINE   25 mg Per Tube Q8H   hydrocortisone    Rectal TID  HYDROmorphone  (DILAUDID ) injection  0.25 mg Intravenous Once   lacosamide   100 mg Per Tube BID   liver oil-zinc  oxide   Topical TID   losartan   25 mg Per Tube Q1200   multivitamin with minerals  1 tablet Per Tube Daily   mycophenolate   500 mg Per Tube BID   mouth rinse  15 mL Mouth Rinse 4 times per day   oxidized cellulose  1 each Topical Once   pantoprazole  (PROTONIX ) IV  40 mg Intravenous Daily   predniSONE   15 mg Per Tube Q breakfast    BMET    Component Value Date/Time   NA 130 (L) 08/04/2024 0729   K 5.0 08/04/2024 0729   CL 88 (L) 08/04/2024 0729    CO2 27 08/04/2024 0729   GLUCOSE 112 (H) 08/04/2024 0729   BUN 90 (H) 08/04/2024 0729   CREATININE 5.44 (H) 08/04/2024 0729   CALCIUM  10.2 08/04/2024 0729   GFRNONAA 9 (L) 08/04/2024 0729   GFRAA  02/02/2022 9167    PATIENT IDENTIFICATION ERROR. PLEASE DISREGARD RESULTS. ACCOUNT WILL BE CREDITED.   CBC    Component Value Date/Time   WBC 5.0 08/04/2024 0729   RBC 2.70 (L) 08/04/2024 0729   HGB 8.6 (L) 08/04/2024 0729   HCT 26.9 (L) 08/04/2024 0729   PLT 146 (L) 08/04/2024 0729   MCV 99.6 08/04/2024 0729   MCH 31.9 08/04/2024 0729   MCHC 32.0 08/04/2024 0729   RDW 15.9 (H) 08/04/2024 0729   LYMPHSABS 0.9 06/24/2024 0827   MONOABS 0.5 06/24/2024 0827   EOSABS 0.1 06/24/2024 0827   BASOSABS 0.0 06/24/2024 0827    Dialysis Orders: 3:45hr, 400/A1.5, EDW 55.2kg, 2K/2Ca bath, AVF, no heparin  - Prev on Mircera 150mcg IV q 2 weeks - No VDRA   Assessment/Plan: Acute R intracranial hemorrhage: 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 Needs ongoing rehab. Doing better.  ESRD:  HD on MWF.  HD next today.  HTN/volume: BP trending back up- has had some hypotension with HD. UF as tolerated. Off amlodipine , now on Coreg  25mg  bid, Losartan  25 mg. Has been tapered off clonidine .   Hydralazine  25mg  TID started by PMD - will see how BP is after UF today, but it may need to be titrated. Appearing euvolemic.  Anemia of ESRD: Hgb 8s, transfuse prn. Continue Aranesp  200 q Thurs Secondary HPTH: CorrCa high  likely due to immobility, phos in goal. Continue sensipar , not on VDRA. 06/15/24 PTH 124, will update.  Nutrition: Alb low, on nepro TF but has a regular diet ordered too. RD note 11/28 cal count intervention is to continue TF for now  - on another cal count currently Hx pancreas/kidney transplant: Remains on IS for pancreas function. Dispo: Insurance declined LTAC, and SNF, appeal pending. Tolerated HD in recliner on multiple days   Manuelita Barters MD [facility] Pager  [phone]

## 2024-08-07 DIAGNOSIS — E440 Moderate protein-calorie malnutrition: Secondary | ICD-10-CM | POA: Diagnosis not present

## 2024-08-07 DIAGNOSIS — I610 Nontraumatic intracerebral hemorrhage in hemisphere, subcortical: Secondary | ICD-10-CM | POA: Diagnosis not present

## 2024-08-07 DIAGNOSIS — I619 Nontraumatic intracerebral hemorrhage, unspecified: Secondary | ICD-10-CM | POA: Diagnosis not present

## 2024-08-07 LAB — RENAL FUNCTION PANEL
Albumin: 3 g/dL — ABNORMAL LOW (ref 3.5–5.0)
Anion gap: 10 (ref 5–15)
BUN: 33 mg/dL — ABNORMAL HIGH (ref 6–20)
CO2: 31 mmol/L (ref 22–32)
Calcium: 9.6 mg/dL (ref 8.9–10.3)
Chloride: 91 mmol/L — ABNORMAL LOW (ref 98–111)
Creatinine, Ser: 2.78 mg/dL — ABNORMAL HIGH (ref 0.44–1.00)
GFR, Estimated: 21 mL/min — ABNORMAL LOW (ref >60)
Glucose, Bld: 106 mg/dL — ABNORMAL HIGH (ref 70–99)
Phosphorus: 4.3 mg/dL (ref 2.5–4.6)
Potassium: 4.5 mmol/L (ref 3.5–5.1)
Sodium: 132 mmol/L — ABNORMAL LOW (ref 135–145)

## 2024-08-07 LAB — GLUCOSE, CAPILLARY
Glucose-Capillary: 106 mg/dL — ABNORMAL HIGH (ref 70–99)
Glucose-Capillary: 100 mg/dL — ABNORMAL HIGH (ref 70–99)
Glucose-Capillary: 143 mg/dL — ABNORMAL HIGH (ref 70–99)
Glucose-Capillary: 136 mg/dL — ABNORMAL HIGH (ref 70–99)

## 2024-08-07 LAB — CBC
HCT: 29.6 % — ABNORMAL LOW (ref 36.0–46.0)
Hemoglobin: 9.1 g/dL — ABNORMAL LOW (ref 12.0–15.0)
MCH: 31.2 pg (ref 26.0–34.0)
MCHC: 30.7 g/dL (ref 30.0–36.0)
MCV: 101.4 fL — ABNORMAL HIGH (ref 80.0–100.0)
Platelets: 146 K/uL — ABNORMAL LOW (ref 150–400)
RBC: 2.92 MIL/uL — ABNORMAL LOW (ref 3.87–5.11)
RDW: 15.3 % (ref 11.5–15.5)
WBC: 3.8 K/uL — ABNORMAL LOW (ref 4.0–10.5)
nRBC: 0 % (ref 0.0–0.2)

## 2024-08-07 MED ORDER — BANATROL TF EN LIQD
60.0000 mL | Freq: Two times a day (BID) | ENTERAL | Status: DC
  Administered 2024-08-07 – 2024-08-13 (×11): 60 mL
  Filled 2024-08-07 (×11): qty 60

## 2024-08-07 NOTE — Progress Notes (Signed)
 Patient ID: [name] [name], female   DOB: 07/23/1981, 43 y.o.   MRN: [id number] S: no complaints, seen in room w mom bedside, had uneventful HD yesterday. C/o 'dizziness' currently - called RN to come check BP but has been in the 160s this AM.  She has no other neurologic changes.  TF on hold after feeling full last PM but says she had a BM and has been drinking.   O:BP (!) 160/64 (BP Location: Left Arm)   Pulse 80   Temp 98.9 F (37.2 C) (Oral)   Resp 16   Ht 5' 3 (1.6 m)   Wt 56.5 kg   SpO2 99%   BMI 22.06 kg/m   Intake/Output Summary (Last 24 hours) at 08/07/2024 0931 Last data filed at 08/07/2024 0800 Gross per 24 hour  Intake 300 ml  Output 0 ml  Net 300 ml   Intake/Output: I/O last 3 completed shifts: In: 240 [P.O.:180; Other:60] Out: -   Intake/Output this shift:  Total I/O In: 240 [P.O.:240] Out: 0  Weight change:  Gen: NAd CVS: RRR Resp:CTA Abd: +BS, soft, NT/ND Ext: no edema, RUE AVF +T/B  Recent Labs  Lab 08/04/24 0729 08/07/24 0220  NA 130* 132*  K 5.0 4.5  CL 88* 91*  CO2 27 31  GLUCOSE 112* 106*  BUN 90* 33*  CREATININE 5.44* 2.78*  ALBUMIN  2.7* 3.0*  CALCIUM  10.2 9.6  PHOS 4.8* 4.3   Liver Function Tests: Recent Labs  Lab 08/04/24 0729 08/07/24 0220  ALBUMIN  2.7* 3.0*   No results for input(s): LIPASE, AMYLASE in the last 168 hours. No results for input(s): AMMONIA in the last 168 hours. CBC: Recent Labs  Lab 08/01/24 0845 08/04/24 0729 08/07/24 0220  WBC 4.0 5.0 3.8*  HGB 7.8* 8.6* 9.1*  HCT 24.2* 26.9* 29.6*  MCV 99.2 99.6 101.4*  PLT 154 146* 146*   Cardiac Enzymes: No results for input(s): CKTOTAL, CKMB, CKMBINDEX, TROPONINI in the last 168 hours. CBG: Recent Labs  Lab 08/06/24 0607 08/06/24 1511 08/06/24 2223 08/07/24 0316 08/07/24 0651  GLUCAP 99 124* 151* 106* 100*    Iron  Studies: No results for input(s): IRON , TIBC, TRANSFERRIN, FERRITIN in the last 72 hours. Studies/Results: DG Abd 1  View Result Date: 08/06/2024 EXAM: 1 VIEW XRAY OF THE ABDOMEN 08/06/2024 06:25:24 AM COMPARISON: 08/04/2024 CLINICAL HISTORY: Abdominal pain FINDINGS: LINES, TUBES AND DEVICES: Percutaneous gastrojejunostomy tube stable in position. VP shunt catheter stable in position. BOWEL: Nonobstructive bowel gas pattern. SOFT TISSUES: Surgical suture in left lower quadrant. Vascular calcifications. No opaque urinary calculi. BONES: No acute osseous abnormality. IMPRESSION: 1. No acute abdominal abnormality identified to explain pain. Electronically signed by: Waddell Calk MD 08/06/2024 06:32 AM EST RP Workstation: [id number]    carvedilol   25 mg Per Tube BID WC   cinacalcet   30 mg Oral Q M,W,F   cycloSPORINE   200 mg Per Tube QPM   cycloSPORINE   225 mg Per Tube Daily   darbepoetin (ARANESP ) injection - DIALYSIS  200 mcg Subcutaneous Q Thu-1800   famotidine   10 mg Per Tube QHS   fiber supplement (BANATROL TF)  60 mL Per Tube BID   gabapentin   100 mg Per Tube Daily   Gerhardt's butt cream   Topical BID   hydrALAZINE   25 mg Per Tube Q8H   hydrocortisone    Rectal TID    HYDROmorphone  (DILAUDID ) injection  0.25 mg Intravenous Once   lacosamide   100 mg Per Tube BID   liver oil-zinc  oxide  Topical TID   losartan   25 mg Per Tube Q1200   multivitamin with minerals  1 tablet Per Tube Daily   mycophenolate   500 mg Per Tube BID   mouth rinse  15 mL Mouth Rinse 4 times per day   oxidized cellulose  1 each Topical Once   pantoprazole  (PROTONIX ) IV  40 mg Intravenous Daily   predniSONE   15 mg Per Tube Q breakfast    BMET    Component Value Date/Time   NA 132 (L) 08/07/2024 0220   K 4.5 08/07/2024 0220   CL 91 (L) 08/07/2024 0220   CO2 31 08/07/2024 0220   GLUCOSE 106 (H) 08/07/2024 0220   BUN 33 (H) 08/07/2024 0220   CREATININE 2.78 (H) 08/07/2024 0220   CALCIUM  9.6 08/07/2024 0220   GFRNONAA 21 (L) 08/07/2024 0220   GFRAA  02/02/2022 9167    PATIENT IDENTIFICATION ERROR. PLEASE DISREGARD RESULTS.  ACCOUNT WILL BE CREDITED.   CBC    Component Value Date/Time   WBC 3.8 (L) 08/07/2024 0220   RBC 2.92 (L) 08/07/2024 0220   HGB 9.1 (L) 08/07/2024 0220   HCT 29.6 (L) 08/07/2024 0220   PLT 146 (L) 08/07/2024 0220   MCV 101.4 (H) 08/07/2024 0220   MCH 31.2 08/07/2024 0220   MCHC 30.7 08/07/2024 0220   RDW 15.3 08/07/2024 0220   LYMPHSABS 0.9 06/24/2024 0827   MONOABS 0.5 06/24/2024 0827   EOSABS 0.1 06/24/2024 0827   BASOSABS 0.0 06/24/2024 0827    Dialysis Orders: 3:45hr, 400/A1.5, EDW 55.2kg, 2K/2Ca bath, AVF, no heparin  - Prev on Mircera 150mcg IV q 2 weeks - No VDRA   Assessment/Plan: Acute R intracranial hemorrhage: 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 Needs ongoing rehab. Doing better.  ESRD:  HD on MWF.  HD next tomorrow.  HTN/volume: BP somewhat labile.  Appearing euvolemic. C/o dizziness just now - will hold on titrating meds pending check of BP now. Anemia of ESRD: Hgb 8s, transfuse prn. Continue Aranesp  200 q Thurs Secondary HPTH: CorrCa high  likely due to immobility, phos in goal. Continue sensipar , not on VDRA. 06/15/24 PTH 124, update pending Nutrition: Alb low, on nepro TF but has a regular diet ordered too. RD note 11/28 cal count intervention is to continue TF for now  - on another cal count currently Hx pancreas/kidney transplant: Remains on IS for pancreas function. Dispo: Insurance declined LTAC, and SNF, appeal pending. Tolerated HD in recliner on multiple days   Manuelita Barters MD [facility] Pager [phone]

## 2024-08-07 NOTE — Plan of Care (Signed)
 Problem: Fluid Volume: Goal: Ability to maintain a balanced intake and output will improve Outcome: Progressing   Problem: Health Behavior/Discharge Planning: Goal: Ability to identify and utilize available resources and services will improve Outcome: Progressing Goal: Ability to manage health-related needs will improve Outcome: Progressing   Problem: Metabolic: Goal: Ability to maintain appropriate glucose levels will improve Outcome: Progressing   Problem: Nutritional: Goal: Maintenance of adequate nutrition will improve Outcome: Progressing Goal: Progress toward achieving an optimal weight will improve Outcome: Progressing   Problem: Skin Integrity: Goal: Risk for impaired skin integrity will decrease Outcome: Progressing   Problem: Tissue Perfusion: Goal: Adequacy of tissue perfusion will improve Outcome: Progressing   Problem: Education: Goal: Knowledge of disease or condition will improve Outcome: Progressing Goal: Knowledge of secondary prevention will improve (MUST DOCUMENT ALL) Outcome: Progressing Goal: Knowledge of patient specific risk factors will improve (DELETE if not current risk factor) Outcome: Progressing   Problem: Intracerebral Hemorrhage Tissue Perfusion: Goal: Complications of Intracerebral Hemorrhage will be minimized Outcome: Progressing   Problem: Coping: Goal: Will verbalize positive feelings about self Outcome: Progressing Goal: Will identify appropriate support needs Outcome: Progressing   Problem: Health Behavior/Discharge Planning: Goal: Ability to manage health-related needs will improve Outcome: Progressing Goal: Goals will be collaboratively established with patient/family Outcome: Progressing   Problem: Self-Care: Goal: Ability to participate in self-care as condition permits will improve Outcome: Progressing Goal: Verbalization of feelings and concerns over difficulty with self-care will improve Outcome: Progressing Goal:  Ability to communicate needs accurately will improve Outcome: Progressing   Problem: Nutrition: Goal: Risk of aspiration will decrease Outcome: Progressing Goal: Dietary intake will improve Outcome: Progressing   Problem: Education: Goal: Knowledge of General Education information will improve Description: Including pain rating scale, medication(s)/side effects and non-pharmacologic comfort measures Outcome: Progressing   Problem: Health Behavior/Discharge Planning: Goal: Ability to manage health-related needs will improve Outcome: Progressing   Problem: Clinical Measurements: Goal: Ability to maintain clinical measurements within normal limits will improve Outcome: Progressing Goal: Will remain free from infection Outcome: Progressing Goal: Diagnostic test results will improve Outcome: Progressing Goal: Respiratory complications will improve Outcome: Progressing Goal: Cardiovascular complication will be avoided Outcome: Progressing   Problem: Activity: Goal: Risk for activity intolerance will decrease Outcome: Progressing   Problem: Nutrition: Goal: Adequate nutrition will be maintained Outcome: Progressing   Problem: Coping: Goal: Level of anxiety will decrease Outcome: Progressing   Problem: Elimination: Goal: Will not experience complications related to bowel motility Outcome: Progressing   Problem: Pain Managment: Goal: General experience of comfort will improve and/or be controlled Outcome: Progressing   Problem: Safety: Goal: Ability to remain free from injury will improve Outcome: Progressing   Problem: Skin Integrity: Goal: Risk for impaired skin integrity will decrease Outcome: Progressing   Problem: Education: Goal: Knowledge of the prescribed therapeutic regimen will improve Outcome: Progressing   Problem: Clinical Measurements: Goal: Usual level of consciousness will be regained or maintained. Outcome: Progressing Goal: Neurologic status  will improve Outcome: Progressing Goal: Ability to maintain intracranial pressure will improve Outcome: Progressing   Problem: Skin Integrity: Goal: Demonstration of wound healing without infection will improve Outcome: Progressing   Problem: Education: Goal: Knowledge of disease and its progression will improve Outcome: Progressing Goal: Individualized Educational Video(s) Outcome: Progressing   Problem: Fluid Volume: Goal: Compliance with measures to maintain balanced fluid volume will improve Outcome: Progressing   Problem: Health Behavior/Discharge Planning: Goal: Ability to manage health-related needs will improve Outcome: Progressing   Problem: Nutritional: Goal: Ability to make  healthy dietary choices will improve Outcome: Progressing   Problem: Clinical Measurements: Goal: Complications related to the disease process, condition or treatment will be avoided or minimized Outcome: Progressing

## 2024-08-07 NOTE — Progress Notes (Addendum)
 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  Patient was noted to have hemorrhagic stroke.  Neurology, neurosurgery in critical care were consulted.  EVD was placed on the left side and was initially admitted to neuro ICU.  She was also intubated and had prolonged intubation.  Subsequently, patient was extubated on 05/27/2024.   Important events:  9/15 admitted with ICH plus IVH, EVD was placed for hydrocephalus, intubated 9/22 palliative care meeting.  Family is insistent that they want full scope of care 9/23 Extubated 10/1 -developed fevers pancultures drawn and broad-spectrum antibiotics started 10/2 became afebrile after starting antibiotics, did not tolerate raising EVD 10/6 EVD was raised from 10 to 30 cm water , tolerating well, remained afebrile 10/7 EVD is at 30 cm water , CT scan will be done tomorrow morning, no overnight issues 10/8 she became somnolent, CT head showed increasing hydrocephalus, EVD was titrated down to 10 cm of water  with improvement in mental status 10/15 VP shunt placement 10/17 transferred to progressive unit with [name]  11/18-medically stable.  Awaiting for disposition.  Insurance declined LTAC and skilled nursing facility, appeal pending. 12/3-tube feeding changed to nocturnal tube feeds, started on p.o. diet    Assessment/Plan:   Acute right parietotemporal intraparenchymal hemorrhage / Intraventricular hemorrhage: Uncontrolled hypertension: Obstructive hydrocephalus s/p EVD changed to VP shunt: Seizure disorder S/p VP shunt placement on 06/18/2024  by Dr. Mavis.  Continue Vimpat .   Neurosurgery intermittently followed the patient during hospitalization.   Currently stable   ESRD on dialysis: History of failed renal transplant: Secondary hyperparathyroidism: Nephrology on board.  Continue CellCept  and prednisone .   On Monday, Wednesday, Friday hemodialysis schedule.   TOC is working on finding outpatient hemodialysis set up  Hyponatremia - Chronic, nephrology following -Patient on hemodialysis    History of pancreatic/kidney transplantation:  Continue cyclosporine , mycophenolate , and prednisone .   Hypertension:  -Blood pressure is stable -Continue Coreg , amlodipine , hydralazine  -Continue clonidine  on non hemodialysis days   Anemia of chronic disease:  Latest hemoglobin  8.5.. Transfuse if Hb below 7.0   Type 1 diabetes with vasculopathy/gastroparesis:  Not on long-term insulin  anymore due to pancreatic transplant.  -She is currently n.p.o. for swallow evaluation/MBS Check CBG every 8 hours   Moderate protein calorie malnutrition/dysphagia:  On PEG tube feeding..   Patient has been having abdominal discomfort - Daytime tube feeding has been stopped; continue nocturnal tube feeds 7P to 7 A - Continue p.o. intake during daytime    Debility/deconditioning/disposition/goals of care:  Palliative care had seen the patient during hospitalization.  Family has insisted full code.   Palliative care has signed off 10/17.  Disposition pending.  TOC on board.  Patient has been seen by physical therapy during hospitalization and patient requiring significant help      DVT prophylaxis: SCDs  Medications     carvedilol   25 mg Per Tube BID WC   cinacalcet   30 mg Oral Q M,W,F   cycloSPORINE   200 mg Per Tube QPM   cycloSPORINE   225 mg Per Tube Daily   darbepoetin (ARANESP ) injection - DIALYSIS  200 mcg Subcutaneous Q Thu-1800   famotidine   10 mg Per  Tube QHS   fiber supplement (BANATROL TF)  60 mL Per Tube BID    gabapentin   100 mg Per Tube Daily   Gerhardt's butt cream   Topical BID   hydrALAZINE   25 mg Per Tube Q8H   hydrocortisone    Rectal TID    HYDROmorphone  (DILAUDID ) injection  0.25 mg Intravenous Once   lacosamide   100 mg Per Tube BID   liver oil-zinc  oxide   Topical TID   losartan   25 mg Per Tube Q1200   multivitamin with minerals  1 tablet Per Tube Daily   mycophenolate   500 mg Per Tube BID   mouth rinse  15 mL Mouth Rinse 4 times per day   oxidized cellulose  1 each Topical Once   pantoprazole  (PROTONIX ) IV  40 mg Intravenous Daily   predniSONE   15 mg Per Tube Q breakfast     Data Reviewed:   CBG:  Recent Labs  Lab 08/06/24 0607 08/06/24 1511 08/06/24 2223 08/07/24 0316 08/07/24 0651  GLUCAP 99 124* 151* 106* 100*    SpO2: 99 % O2 Flow Rate (L/min): 100 L/min FiO2 (%): 40 %    Vitals:   08/07/24 0600 08/07/24 0625 08/07/24 0656 08/07/24 0718  BP: (!) 169/71 (!) 169/71 (!) 159/72 (!) 160/64  Pulse:    80  Resp:    16  Temp:    98.9 F (37.2 C)  TempSrc:    Oral  SpO2:    99%  Weight:      Height:          Data Reviewed:  Basic Metabolic Panel: Recent Labs  Lab 08/04/24 0729 08/07/24 0220  NA 130* 132*  K 5.0 4.5  CL 88* 91*  CO2 27 31  GLUCOSE 112* 106*  BUN 90* 33*  CREATININE 5.44* 2.78*  CALCIUM  10.2 9.6  PHOS 4.8* 4.3    CBC: Recent Labs  Lab 08/01/24 0845 08/04/24 0729 08/07/24 0220  WBC 4.0 5.0 3.8*  HGB 7.8* 8.6* 9.1*  HCT 24.2* 26.9* 29.6*  MCV 99.2 99.6 101.4*  PLT 154 146* 146*    LFT Recent Labs  Lab 08/04/24 0729 08/07/24 0220  ALBUMIN  2.7* 3.0*     Antibiotics: Anti-infectives (From admission, onward)    Start     Dose/Rate Route Frequency Ordered Stop   07/08/24 1530  fluconazole  (DIFLUCAN ) tablet 150 mg        150 mg Oral  Once 07/08/24 1437 07/08/24 1544   06/18/24 2030  ceFAZolin  (ANCEF ) IVPB 2g/100 mL premix  Status:  Discontinued        2 g 200 mL/hr over 30 Minutes Intravenous Every 8 hours 06/18/24  1454 06/19/24 1608   06/18/24 0600  ceFAZolin  (ANCEF ) IVPB 2g/100 mL premix        2 g 200 mL/hr over 30 Minutes Intravenous On call to O.R. 06/15/24 1152 06/18/24 1230   06/06/24 1400  piperacillin -tazobactam (ZOSYN ) IVPB 2.25 g        2.25 g 100 mL/hr over 30 Minutes Intravenous Every 8 hours 06/06/24 1017 06/09/24 0538   06/06/24 1200  vancomycin  (VANCOREADY) IVPB 500 mg/100 mL        500 mg 100 mL/hr over 60 Minutes Intravenous Every M-W-F (Hemodialysis) 06/04/24 1745 06/06/24 1900   06/04/24 1845  vancomycin  (VANCOCIN ) IVPB 1000 mg/200 mL premix        1,000 mg 200 mL/hr over 60 Minutes Intravenous  Once 06/04/24 1745 06/04/24 1915   06/04/24 1830  ceFEPIme  (MAXIPIME )  2 g in sodium chloride  0.9 % 100 mL IVPB  Status:  Discontinued        2 g 200 mL/hr over 30 Minutes Intravenous Every M-W-F (Hemodialysis) 06/04/24 1743 06/06/24 1017   05/27/24 1015  cefTRIAXone  (ROCEPHIN ) 2 g in sodium chloride  0.9 % 100 mL IVPB        2 g 200 mL/hr over 30 Minutes Intravenous Every 24 hours 05/27/24 0918 05/29/24 1014   05/23/24 1530  ceFEPIme  (MAXIPIME ) 1 g in sodium chloride  0.9 % 100 mL IVPB  Status:  Discontinued        1 g 200 mL/hr over 30 Minutes Intravenous Every 24 hours 05/23/24 1433 05/27/24 0918   05/22/24 2200  Ampicillin -Sulbactam (UNASYN ) 3 g in sodium chloride  0.9 % 100 mL IVPB  Status:  Discontinued        3 g 200 mL/hr over 30 Minutes Intravenous Every 24 hours 05/22/24 0834 05/23/24 1433   05/22/24 0930  Ampicillin -Sulbactam (UNASYN ) 3 g in sodium chloride  0.9 % 100 mL IVPB        3 g 200 mL/hr over 30 Minutes Intravenous  Once 05/22/24 0834 05/22/24 1900        CONSULTS neurosurgery, palliative care  Code Status: Full code  Family Communication:      Subjective   Daytime tube feeding stopped and patient feels better.  No further episodes of nausea or vomiting.  Bloating has improved   Objective    Physical Examination:    Appears in no acute  distress S1-S2, regular Lungs are clear to auscultation bilaterally Abdomen is soft, nontender      [name] [name]   [name] If 7PM-7AM, please contact night-coverage at [email or web address], Office  [phone]   08/07/2024, 8:36 AM  LOS: 80 days

## 2024-08-07 NOTE — Progress Notes (Signed)
 Physical Therapy Treatment Patient Details Name: [name] [name] MRN: [id number] DOB: 1981-02-25 Today's Date: 08/07/2024   History of Present Illness 43 yo female presents 05/19/24 for left gaze and L side weakness. 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 Intubated 9/15-9/23. EVD 9/15- 10/15; s/p shunt 10/15. S/p laparoscopy with intra-abdominal assistance ventricular-peritoneal shunt placement 10/15. PMH: HTN, ESRD on HD s/p renal and pancreatic transplants, DM, gastroparesis, multiple strokes (ischemic and hemorrhagic) with prepontine SAH, hydrocephalus requiring EVD placement    PT Comments  Pt tolerated treatment well today. Pt able to ambulate short distance in hallway with RW CGA and a chair follow. 1 seated rest break in hallway. Pt limited by dizziness and fatigue however no LOB noted. No change in DC/DME recs at this time. PT will continue to follow.     If plan is discharge home, recommend the following: A lot of help with walking and/or transfers;A lot of help with bathing/dressing/bathroom;Assistance with cooking/housework;Direct supervision/assist for medications management;Direct supervision/assist for financial management;Assist for transportation;Help with stairs or ramp for entrance   Can travel by private vehicle     No  Equipment Recommendations  Hospital bed;Hoyer lift;Wheelchair (measurements PT);Wheelchair cushion (measurements PT);BSC/3in1    Recommendations for Other Services       Precautions / Restrictions Precautions Precautions: Fall Recall of Precautions/Restrictions: Impaired Precaution/Restrictions Comments: PEG Restrictions Weight Bearing Restrictions Per Provider Order: No     Mobility  Bed Mobility Overal bed mobility: Needs Assistance Bed Mobility: Supine to Sit     Supine to sit: Contact guard     General bed mobility  comments: CGA for safety.    Transfers Overall transfer level: Needs assistance Equipment used: Rolling walker (2 wheels) Transfers: Sit to/from Stand Sit to Stand: Contact guard assist           General transfer comment: Stood from bed height with VC for hand placement and powering up to RW.    Ambulation/Gait Ambulation/Gait assistance: +2 safety/equipment, Contact guard assist Gait Distance (Feet): 50 Feet (35+15) Assistive device: Rolling walker (2 wheels) Gait Pattern/deviations: Step-through pattern, Decreased stride length, Trunk flexed, Shuffle Gait velocity: Dec     General Gait Details: Pt able to ambulate short distance in hallway with RW CGA and a chair follow. 1 seated rest break in hallway. Pt limited by dizziness and fatigue however no LOB noted.   Stairs             Wheelchair Mobility     Tilt Bed    Modified Rankin (Stroke Patients Only)       Balance Overall balance assessment: Needs assistance Sitting-balance support: No upper extremity supported, Feet supported Sitting balance-Leahy Scale: Good Sitting balance - Comments: seated EOB, no LOB observed   Standing balance support: Bilateral upper extremity supported, During functional activity, Reliant on assistive device for balance Standing balance-Leahy Scale: Poor Standing balance comment: reliant on RW support                            Communication Communication Communication: No apparent difficulties  Cognition Arousal: Alert Behavior During Therapy: WFL for tasks assessed/performed                             Following commands: Impaired Following commands impaired: Follows one step commands with increased time, Follows multi-step  commands inconsistently    Cueing Cueing Techniques: Verbal cues, Gestural cues  Exercises      General Comments General comments (skin integrity, edema, etc.): VSS      Pertinent Vitals/Pain Pain Assessment Pain  Assessment: Faces Faces Pain Scale: No hurt Pain Intervention(s): Monitored during session    Home Living                          Prior Function            PT Goals (current goals can now be found in the care plan section) Progress towards PT goals: Progressing toward goals    Frequency    Min 2X/week      PT Plan      Co-evaluation              AM-PAC PT 6 Clicks Mobility   Outcome Measure  Help needed turning from your back to your side while in a flat bed without using bedrails?: A Little Help needed moving from lying on your back to sitting on the side of a flat bed without using bedrails?: A Lot Help needed moving to and from a bed to a chair (including a wheelchair)?: A Little Help needed standing up from a chair using your arms (e.g., wheelchair or bedside chair)?: A Little Help needed to walk in hospital room?: A Little Help needed climbing 3-5 steps with a railing? : A Lot 6 Click Score: 16    End of Session Equipment Utilized During Treatment: Gait belt Activity Tolerance: Patient tolerated treatment well Patient left: in chair;with call bell/phone within reach;with chair alarm set;with nursing/sitter in room Nurse Communication: Mobility status PT Visit Diagnosis: Unsteadiness on feet (R26.81);Other abnormalities of gait and mobility (R26.89);Muscle weakness (generalized) (M62.81);Difficulty in walking, not elsewhere classified (R26.2);Other symptoms and signs involving the nervous system (R29.898)     Time: 1429-1450 PT Time Calculation (min) (ACUTE ONLY): 21 min  Charges:    $Gait Training: 8-22 mins PT General Charges $$ ACUTE PT VISIT: 1 Visit                     Natasha Paulson B, PT, DPT Acute Rehab Services [phone]    Art Levan 08/07/2024, 3:52 PM

## 2024-08-08 DIAGNOSIS — I610 Nontraumatic intracerebral hemorrhage in hemisphere, subcortical: Secondary | ICD-10-CM | POA: Diagnosis not present

## 2024-08-08 DIAGNOSIS — I619 Nontraumatic intracerebral hemorrhage, unspecified: Secondary | ICD-10-CM | POA: Diagnosis not present

## 2024-08-08 DIAGNOSIS — E440 Moderate protein-calorie malnutrition: Secondary | ICD-10-CM | POA: Diagnosis not present

## 2024-08-08 LAB — RENAL FUNCTION PANEL
Albumin: 2.9 g/dL — ABNORMAL LOW (ref 3.5–5.0)
Anion gap: 15 (ref 5–15)
BUN: 57 mg/dL — ABNORMAL HIGH (ref 6–20)
CO2: 26 mmol/L (ref 22–32)
Calcium: 10.1 mg/dL (ref 8.9–10.3)
Chloride: 91 mmol/L — ABNORMAL LOW (ref 98–111)
Creatinine, Ser: 4.7 mg/dL — ABNORMAL HIGH (ref 0.44–1.00)
GFR, Estimated: 11 mL/min — ABNORMAL LOW (ref >60)
Glucose, Bld: 100 mg/dL — ABNORMAL HIGH (ref 70–99)
Phosphorus: 5.9 mg/dL — ABNORMAL HIGH (ref 2.5–4.6)
Potassium: 4.6 mmol/L (ref 3.5–5.1)
Sodium: 132 mmol/L — ABNORMAL LOW (ref 135–145)

## 2024-08-08 LAB — GLUCOSE, CAPILLARY
Glucose-Capillary: 102 mg/dL — ABNORMAL HIGH (ref 70–99)
Glucose-Capillary: 110 mg/dL — ABNORMAL HIGH (ref 70–99)
Glucose-Capillary: 115 mg/dL — ABNORMAL HIGH (ref 70–99)
Glucose-Capillary: 117 mg/dL — ABNORMAL HIGH (ref 70–99)

## 2024-08-08 LAB — CBC WITH DIFFERENTIAL/PLATELET
Abs Immature Granulocytes: 0.01 K/uL (ref 0.00–0.07)
Basophils Absolute: 0 K/uL (ref 0.0–0.1)
Basophils Relative: 0 %
Eosinophils Absolute: 0 K/uL (ref 0.0–0.5)
Eosinophils Relative: 1 %
HCT: 27.8 % — ABNORMAL LOW (ref 36.0–46.0)
Hemoglobin: 8.6 g/dL — ABNORMAL LOW (ref 12.0–15.0)
Immature Granulocytes: 0 %
Lymphocytes Relative: 24 %
Lymphs Abs: 0.9 K/uL (ref 0.7–4.0)
MCH: 31.7 pg (ref 26.0–34.0)
MCHC: 30.9 g/dL (ref 30.0–36.0)
MCV: 102.6 fL — ABNORMAL HIGH (ref 80.0–100.0)
Monocytes Absolute: 0.3 K/uL (ref 0.1–1.0)
Monocytes Relative: 9 %
Neutro Abs: 2.3 K/uL (ref 1.7–7.7)
Neutrophils Relative %: 66 %
Platelets: 131 K/uL — ABNORMAL LOW (ref 150–400)
RBC: 2.71 MIL/uL — ABNORMAL LOW (ref 3.87–5.11)
RDW: 15.2 % (ref 11.5–15.5)
WBC: 3.5 K/uL — ABNORMAL LOW (ref 4.0–10.5)
nRBC: 0 % (ref 0.0–0.2)

## 2024-08-08 LAB — PTH, INTACT AND CALCIUM
Calcium, Total (PTH): 9.9 mg/dL (ref 8.7–10.2)
PTH: 126 pg/mL — ABNORMAL HIGH (ref 15–65)

## 2024-08-08 MED ORDER — LOPERAMIDE HCL 1 MG/7.5ML PO SUSP: 4.0000 mg | ORAL | Status: DC | PRN

## 2024-08-08 MED ORDER — LOSARTAN POTASSIUM 25 MG PO TABS
25.0000 mg | ORAL_TABLET | Freq: Every day | ORAL | Status: DC
  Administered 2024-08-09 – 2024-08-14 (×6): 25 mg via ORAL
  Filled 2024-08-08 (×6): qty 1

## 2024-08-08 MED ORDER — ADULT MULTIVITAMIN W/MINERALS CH
1.0000 | ORAL_TABLET | Freq: Every day | ORAL | Status: DC
  Administered 2024-08-09 – 2024-08-11 (×3): 1 via ORAL
  Filled 2024-08-08 (×3): qty 1

## 2024-08-08 MED ORDER — ONDANSETRON HCL 4 MG PO TABS
8.0000 mg | ORAL_TABLET | Freq: Three times a day (TID) | ORAL | Status: DC | PRN
  Administered 2024-08-16: 8 mg via ORAL
  Filled 2024-08-08: qty 2

## 2024-08-08 MED ORDER — ACETAMINOPHEN 325 MG PO TABS
650.0000 mg | ORAL_TABLET | ORAL | Status: DC | PRN
  Administered 2024-08-08 – 2024-08-27 (×9): 650 mg via ORAL
  Filled 2024-08-08 (×8): qty 2

## 2024-08-08 MED ORDER — MYCOPHENOLATE 200 MG/ML ORAL SUSPENSION
500.0000 mg | Freq: Two times a day (BID) | ORAL | Status: DC
  Administered 2024-08-08 – 2024-08-13 (×10): 500 mg via ORAL
  Filled 2024-08-08 (×11): qty 2.5

## 2024-08-08 MED ORDER — HYDRALAZINE HCL 25 MG PO TABS
25.0000 mg | ORAL_TABLET | Freq: Three times a day (TID) | ORAL | Status: DC
  Administered 2024-08-08 – 2024-08-15 (×20): 25 mg via ORAL
  Filled 2024-08-08 (×20): qty 1

## 2024-08-08 MED ORDER — LACOSAMIDE 50 MG PO TABS
100.0000 mg | ORAL_TABLET | Freq: Two times a day (BID) | ORAL | Status: DC
  Administered 2024-08-08 – 2024-08-27 (×38): 100 mg via ORAL
  Filled 2024-08-08 (×38): qty 2

## 2024-08-08 MED ORDER — AMLODIPINE BESYLATE 5 MG PO TABS
5.0000 mg | ORAL_TABLET | Freq: Every day | ORAL | Status: DC
  Administered 2024-08-08 – 2024-08-09 (×2): 5 mg via ORAL
  Filled 2024-08-08 (×2): qty 1

## 2024-08-08 MED ORDER — SIMETHICONE 40 MG/0.6ML PO SUSP
40.0000 mg | Freq: Four times a day (QID) | ORAL | Status: DC | PRN
  Administered 2024-08-09: 40 mg via ORAL
  Filled 2024-08-08: qty 0.6

## 2024-08-08 MED ORDER — CYCLOSPORINE MODIFIED (GENGRAF) 100 MG/ML PO SOLN
225.0000 mg | Freq: Every day | ORAL | Status: DC
  Administered 2024-08-09 – 2024-08-13 (×5): 225 mg via ORAL
  Filled 2024-08-08: qty 2.25
  Filled 2024-08-08 (×3): qty 2.3
  Filled 2024-08-08: qty 2.25

## 2024-08-08 MED ORDER — FAMOTIDINE 20 MG PO TABS
10.0000 mg | ORAL_TABLET | Freq: Every day | ORAL | Status: DC
  Administered 2024-08-08 – 2024-08-26 (×19): 10 mg via ORAL
  Filled 2024-08-08 (×19): qty 1

## 2024-08-08 MED ORDER — ACETAMINOPHEN 650 MG RE SUPP: 650.0000 mg | RECTAL | Status: DC | PRN

## 2024-08-08 MED ORDER — CARVEDILOL 12.5 MG PO TABS
25.0000 mg | ORAL_TABLET | Freq: Two times a day (BID) | ORAL | Status: DC
  Administered 2024-08-08 – 2024-08-26 (×31): 25 mg via ORAL
  Filled 2024-08-08 (×33): qty 2

## 2024-08-08 MED ORDER — PREDNISONE 5 MG PO TABS
15.0000 mg | ORAL_TABLET | Freq: Every day | ORAL | Status: DC
  Administered 2024-08-09 – 2024-08-27 (×18): 15 mg via ORAL
  Filled 2024-08-08 (×20): qty 3

## 2024-08-08 MED ORDER — ACETAMINOPHEN 160 MG/5ML PO SOLN: 650.0000 mg | ORAL | Status: DC | PRN

## 2024-08-08 MED ORDER — DIPHENHYDRAMINE HCL 25 MG PO CAPS
25.0000 mg | ORAL_CAPSULE | Freq: Four times a day (QID) | ORAL | Status: DC | PRN
  Administered 2024-08-12 – 2024-08-25 (×2): 25 mg via ORAL

## 2024-08-08 MED ORDER — SENNOSIDES 8.8 MG/5ML PO SYRP
5.0000 mL | ORAL_SOLUTION | Freq: Two times a day (BID) | ORAL | Status: DC | PRN
  Administered 2024-08-12 – 2024-08-24 (×4): 5 mL via ORAL
  Filled 2024-08-08 (×6): qty 5

## 2024-08-08 MED ORDER — GABAPENTIN 250 MG/5ML PO SOLN
100.0000 mg | Freq: Every day | ORAL | Status: DC
  Administered 2024-08-09 – 2024-08-13 (×5): 100 mg via ORAL
  Filled 2024-08-08 (×5): qty 2

## 2024-08-08 MED ORDER — CYCLOSPORINE MODIFIED (GENGRAF) 100 MG/ML PO SOLN
200.0000 mg | Freq: Every evening | ORAL | Status: DC
  Administered 2024-08-08 – 2024-08-12 (×5): 200 mg via ORAL
  Filled 2024-08-08 (×6): qty 2

## 2024-08-08 NOTE — TOC Progression Note (Signed)
 Transition of Care [facility]) - Progression Note    Patient Details  Name: [name] [name] MRN: [id number] Date of Birth: Mar 26, 1981  [facility] CM/SW Contact  [name], [name] Phone Number: 08/08/2024, 3:57 PM  Clinical Narrative:   CSW updated by [facility] that they have beds opening up, could admit the patient as long as the mother is agreeable to not stay the night at the SNF. CSW met with patient's mother to provide update, she has no transportation to get back and forth, doesn't want the patient to go anywhere that she cannot stay because she cannot get there. CSW told mother to think about it.     Expected Discharge Plan: Skilled Nursing Facility Barriers to Discharge: No SNF bed, Insurance Authorization               Expected Discharge Plan and Services In-house Referral: Clinical Social Work   Post Acute Care Choice: Skilled Nursing Facility Living arrangements for the past 2 months: Apartment                                       Social Drivers of Health (SDOH) Interventions SDOH Screenings   Food Insecurity: Patient Unable To Answer (05/20/2024)  Housing: Unknown (07/14/2024)  Transportation Needs: Patient Unable To Answer (05/20/2024)  Utilities: Patient Unable To Answer (05/20/2024)  Social Connections: Unknown (10/04/2022)   Received from [facility]  Stress: No Stress Concern Present (04/16/2024)   Received from [facility]  Tobacco Use: Medium Risk (06/18/2024)    Readmission Risk Interventions    05/20/2024    4:16 PM 03/31/2024   11:27 AM 11/17/2021   12:15 PM  Readmission Risk Prevention Plan  Transportation Screening Complete Complete Complete  Medication Review Oceanographer) Complete Complete Complete  PCP or Specialist appointment within 3-5 days of discharge Complete  Complete  HRI or Home Care Consult Complete Complete Complete  SW Recovery Care/Counseling Consult Complete Complete Complete  Palliative  Care Screening Complete Not Applicable Not Applicable  Skilled Nursing Facility Complete Not Applicable Patient Refused

## 2024-08-08 NOTE — Progress Notes (Signed)
 OT Cancellation Note  Patient Details Name: [name] [name] MRN: [id number] DOB: 1980/10/10   Cancelled Treatment:    Reason Eval/Treat Not Completed: Patient at procedure or test/ unavailable (Patient off unit in HD. OT to reattempt as schedule permits.)  [name] 08/08/2024, 9:52 AM  [name], OTA Acute Rehabilitation Services  Office [phone]

## 2024-08-08 NOTE — TOC Progression Note (Signed)
 Transition of Care [facility]) - Progression Note    Patient Details  Name: [name] [name] MRN: [id number] Date of Birth: 1981-06-20  [facility]) CM/SW Contact  [name], [name] Phone Number: 08/08/2024, 4:01 PM  Clinical Narrative:   CSW met with patient's mother again to discuss. Mother again is adamantly opposed to the patient going anywhere that she cannot stay, she does not have transportation to get back and forth. Mother says she has a friend that provides transportation for her intermittently when she needs to go back to the patient's apartment to pay rent and shower, but she could not ask them to do that on the daily basis. Mother said she has reached out to [facility] to discuss with them and see if an exception can be made, that she does not want them to do anything for her other than let her be with her daughter. CSW asked the mother about taking the patient home, but mother wants the patient to be able to get the daily therapy; she won't get daily therapy at home. CSW to check back with [facility] on accepting admissions, awaiting response. CSW to follow.    Expected Discharge Plan: Skilled Nursing Facility Barriers to Discharge: No SNF bed, Insurance Authorization               Expected Discharge Plan and Services In-house Referral: Clinical Social Work   Post Acute Care Choice: Skilled Nursing Facility Living arrangements for the past 2 months: Apartment                                       Social Drivers of Health (SDOH) Interventions SDOH Screenings   Food Insecurity: Patient Unable To Answer (05/20/2024)  Housing: Unknown (07/14/2024)  Transportation Needs: Patient Unable To Answer (05/20/2024)  Utilities: Patient Unable To Answer (05/20/2024)  Social Connections: Unknown (10/04/2022)   Received from [facility]  Stress: No Stress Concern Present (04/16/2024)   Received from [facility]  Tobacco Use: Medium Risk  (06/18/2024)    Readmission Risk Interventions    05/20/2024    4:16 PM 03/31/2024   11:27 AM 11/17/2021   12:15 PM  Readmission Risk Prevention Plan  Transportation Screening Complete Complete Complete  Medication Review Oceanographer) Complete Complete Complete  PCP or Specialist appointment within 3-5 days of discharge Complete  Complete  HRI or Home Care Consult Complete Complete Complete  SW Recovery Care/Counseling Consult Complete Complete Complete  Palliative Care Screening Complete Not Applicable Not Applicable  Skilled Nursing Facility Complete Not Applicable Patient Refused

## 2024-08-08 NOTE — Progress Notes (Signed)
 [facility]ospitalist  PROGRESS NOTE  [name] [name] FMW:[id number] DOB: 06-28-1981 DOA: 05/19/2024 PCP: [facility]   Brief HPI:    43-yrs-old female with past medical history of hypertension, CVA, type 1 diabetes, gastroparesis, kidney and pancreatic transplant 2014 with subsequent failure of renal transplant now on end-stage renal dialysis, anxiety, narcotic abuse presented to the hospital from dialysis unit on 05/19/2024 as code stroke.  Patient was noted to have hemorrhagic stroke.  Neurology, neurosurgery in critical care were consulted.  EVD was placed on the left side and was initially admitted to neuro ICU.  She was also intubated and had prolonged intubation.  Subsequently, patient was extubated on 05/27/2024.   Important events:  9/15 admitted with ICH plus IVH, EVD was placed for hydrocephalus, intubated 9/22 palliative care meeting.  Family is insistent that they want full scope of care 9/23 Extubated 10/1 -developed fevers pancultures drawn and broad-spectrum antibiotics started 10/2 became afebrile after starting antibiotics, did not tolerate raising EVD 10/6 EVD was raised from 10 to 30 cm water , tolerating well, remained afebrile 10/7 EVD is at 30 cm water , CT scan will be done tomorrow morning, no overnight issues 10/8 she became somnolent, CT head showed increasing hydrocephalus, EVD was titrated down to 10 cm of water  with improvement in mental status 10/15 VP shunt placement 10/17 transferred to progressive unit with [name]  11/18-medically stable.  Awaiting for disposition.  Insurance declined LTAC and skilled nursing facility, appeal pending. 12/3-tube feeding changed to nocturnal tube feeds, started on p.o. diet    Assessment/Plan:   Acute right parietotemporal intraparenchymal hemorrhage / Intraventricular hemorrhage: Uncontrolled hypertension: Obstructive hydrocephalus s/p EVD changed to VP shunt: Seizure disorder S/p VP shunt placement on 06/18/2024  by Dr. Mavis.  Continue Vimpat .   Neurosurgery intermittently followed the patient during hospitalization.   Currently stable   ESRD on dialysis: History of failed renal transplant: Secondary hyperparathyroidism: Nephrology on board.  Continue CellCept  and prednisone .   On Monday, Wednesday, Friday hemodialysis schedule.   TOC is working on finding outpatient hemodialysis set up  Hyponatremia - Chronic, nephrology following -Patient on hemodialysis    History of pancreatic/kidney transplantation:  Continue cyclosporine , mycophenolate , and prednisone .   Hypertension:  -Blood pressure is stable -Continue Coreg , will add amlodipine  5 mg daily - Clonidine  has been discontinued  Dizziness - Unclear etiology - Has been having intermittent dizziness - Will add meclizine  as needed   Anemia of chronic disease:  Latest hemoglobin  8.5.. Transfuse if Hb below 7.0   Type 1 diabetes with vasculopathy/gastroparesis:  Not on long-term insulin  anymore due to pancreatic transplant.  -She is currently n.p.o. for swallow evaluation/MBS Check CBG every 8 hours   Moderate protein calorie malnutrition/dysphagia:  On PEG tube feeding..   Patient has been having abdominal discomfort - Daytime tube feeding has been stopped; continue nocturnal tube feeds 7P to 7 A - Continue p.o. intake during daytime; appetite has improved - Nausea and vomiting has resolved    Debility/deconditioning/disposition/goals of care:  Palliative care had seen the patient during hospitalization.  Family has insisted full code.   Palliative care has signed off 10/17.  Disposition pending.  TOC on board.  Patient has been seen by physical therapy during hospitalization and patient requiring significant help      DVT prophylaxis: SCDs  Medications     carvedilol   25 mg Per Tube BID WC   cinacalcet   30 mg Oral Q M,W,F   cycloSPORINE   200 mg Per Tube QPM  cycloSPORINE   225 mg Per Tube Daily   darbepoetin  (ARANESP ) injection - DIALYSIS  200 mcg Subcutaneous Q Thu-1800   famotidine   10 mg Per Tube QHS   fiber supplement (BANATROL TF)  60 mL Per Tube BID   gabapentin   100 mg Per Tube Daily   Gerhardt's butt cream   Topical BID   hydrALAZINE   25 mg Per Tube Q8H   hydrocortisone    Rectal TID    HYDROmorphone  (DILAUDID ) injection  0.25 mg Intravenous Once   lacosamide   100 mg Per Tube BID   liver oil-zinc  oxide   Topical TID   losartan   25 mg Per Tube Q1200   multivitamin with minerals  1 tablet Per Tube Daily   mycophenolate   500 mg Per Tube BID   mouth rinse  15 mL Mouth Rinse 4 times per day   oxidized cellulose  1 each Topical Once   pantoprazole  (PROTONIX ) IV  40 mg Intravenous Daily   predniSONE   15 mg Per Tube Q breakfast     Data Reviewed:   CBG:  Recent Labs  Lab 08/07/24 0651 08/07/24 1512 08/07/24 2159 08/08/24 0344 08/08/24 0621  GLUCAP 100* 143* 136* 102* 117*    SpO2: 100 % O2 Flow Rate (L/min): 100 L/min FiO2 (%): 40 %    Vitals:   08/07/24 2334 08/08/24 0356 08/08/24 0500 08/08/24 0509  BP: (!) 147/61 (!) 159/67  (!) 165/66  Pulse: 79 82    Resp: 17 17    Temp: 97.8 F (36.6 C) 98.7 F (37.1 C)    TempSrc: Oral Oral    SpO2: 100% 100%    Weight:   54.3 kg   Height:          Data Reviewed:  Basic Metabolic Panel: Recent Labs  Lab 08/04/24 0729 08/07/24 0220  NA 130* 132*  K 5.0 4.5  CL 88* 91*  CO2 27 31  GLUCOSE 112* 106*  BUN 90* 33*  CREATININE 5.44* 2.78*  CALCIUM  10.2 9.6  PHOS 4.8* 4.3    CBC: Recent Labs  Lab 08/01/24 0845 08/04/24 0729 08/07/24 0220  WBC 4.0 5.0 3.8*  HGB 7.8* 8.6* 9.1*  HCT 24.2* 26.9* 29.6*  MCV 99.2 99.6 101.4*  PLT 154 146* 146*    LFT Recent Labs  Lab 08/04/24 0729 08/07/24 0220  ALBUMIN  2.7* 3.0*     Antibiotics: Anti-infectives (From admission, onward)    Start     Dose/Rate Route Frequency Ordered Stop   07/08/24 1530  fluconazole  (DIFLUCAN ) tablet 150 mg        150 mg Oral   Once 07/08/24 1437 07/08/24 1544   06/18/24 2030  ceFAZolin  (ANCEF ) IVPB 2g/100 mL premix  Status:  Discontinued        2 g 200 mL/hr over 30 Minutes Intravenous Every 8 hours 06/18/24 1454 06/19/24 1608   06/18/24 0600  ceFAZolin  (ANCEF ) IVPB 2g/100 mL premix        2 g 200 mL/hr over 30 Minutes Intravenous On call to O.R. 06/15/24 1152 06/18/24 1230   06/06/24 1400  piperacillin -tazobactam (ZOSYN ) IVPB 2.25 g        2.25 g 100 mL/hr over 30 Minutes Intravenous Every 8 hours 06/06/24 1017 06/09/24 0538   06/06/24 1200  vancomycin  (VANCOREADY) IVPB 500 mg/100 mL        500 mg 100 mL/hr over 60 Minutes Intravenous Every M-W-F (Hemodialysis) 06/04/24 1745 06/06/24 1900   06/04/24 1845  vancomycin  (VANCOCIN ) IVPB 1000 mg/200  mL premix        1,000 mg 200 mL/hr over 60 Minutes Intravenous  Once 06/04/24 1745 06/04/24 1915   06/04/24 1830  ceFEPIme  (MAXIPIME ) 2 g in sodium chloride  0.9 % 100 mL IVPB  Status:  Discontinued        2 g 200 mL/hr over 30 Minutes Intravenous Every M-W-F (Hemodialysis) 06/04/24 1743 06/06/24 1017   05/27/24 1015  cefTRIAXone  (ROCEPHIN ) 2 g in sodium chloride  0.9 % 100 mL IVPB        2 g 200 mL/hr over 30 Minutes Intravenous Every 24 hours 05/27/24 0918 05/29/24 1014   05/23/24 1530  ceFEPIme  (MAXIPIME ) 1 g in sodium chloride  0.9 % 100 mL IVPB  Status:  Discontinued        1 g 200 mL/hr over 30 Minutes Intravenous Every 24 hours 05/23/24 1433 05/27/24 0918   05/22/24 2200  Ampicillin -Sulbactam (UNASYN ) 3 g in sodium chloride  0.9 % 100 mL IVPB  Status:  Discontinued        3 g 200 mL/hr over 30 Minutes Intravenous Every 24 hours 05/22/24 0834 05/23/24 1433   05/22/24 0930  Ampicillin -Sulbactam (UNASYN ) 3 g in sodium chloride  0.9 % 100 mL IVPB        3 g 200 mL/hr over 30 Minutes Intravenous  Once 05/22/24 0834 05/22/24 1900        CONSULTS neurosurgery, palliative care  Code Status: Full code  Family Communication:      Subjective    Patient seen  during hemodialysis.  Her appetite has improved after stopping daytime tube feeding.  Has been complaining of intermittent dizziness.  Denies nausea vomiting or bloating.  Objective    Physical Examination:  Appears in no acute distress S1-S2, regular, no murmur auscultated Abdomen is soft, nontender, no organomegaly     Marlean Mortell S Jelitza Manninen   [name] If 7PM-7AM, please contact night-coverage at [email or web address], Office  [phone]   08/08/2024, 7:43 AM  LOS: 81 days

## 2024-08-08 NOTE — Progress Notes (Signed)
   08/08/24 1242  Vitals  Temp 98.1 F (36.7 C)  Pulse Rate 92  Resp 18  BP (!) 128/48  SpO2 97 %  O2 Device Room Air  Weight 52.9 kg  Type of Weight Post-Dialysis  Oxygen  Therapy  Patient Activity (if Appropriate) In bed  Pulse Oximetry Type Continuous  Oximetry Probe Site Changed No  Post Treatment  Dialyzer Clearance Lightly streaked  Hemodialysis Intake (mL) 0 mL  Liters Processed 73.5  Fluid Removed (mL) 1400 mL  Tolerated HD Treatment Yes  AVG/AVF Arterial Site Held (minutes) 10 minutes  AVG/AVF Venous Site Held (minutes) 10 minutes   Received patient in bed to unit.  Alert and oriented.  Informed consent signed and in chart.   TX duration:3.5  Patient tolerated well.  Transported back to the room  Alert, without acute distress.  Hand-off given to patient's nurse.   Access used: RUAF Access issues: no complications  Total UF removed: 1400 Medication(s) given: none   [name] [name] [facility]

## 2024-08-08 NOTE — Progress Notes (Signed)
 Patient ID: [name], female   DOB: 11-04-1980, 43 y.o.   MRN: [id number] S: Seen at HD, tolerating treatment well.  Was dizzy yesterday AM but BP was fine and it was transient and resolved.  She has no new complaints now.   O:BP (!) 155/41   Pulse 81   Temp 98.1 F (36.7 C)   Resp 18   Ht 5' 3 (1.6 m)   Wt 54.3 kg   SpO2 97%   BMI 21.21 kg/m   Intake/Output Summary (Last 24 hours) at 08/08/2024 0937 Last data filed at 08/08/2024 0636 Gross per 24 hour  Intake 1384.5 ml  Output 0 ml  Net 1384.5 ml   Intake/Output: I/O last 3 completed shifts: In: 1624.5 [P.O.:480; NG/GT:1144.5] Out: 0   Intake/Output this shift:  No intake/output data recorded. Weight change:  Gen: NAd CVS: RRR Resp:CTA Abd: +BS, soft, NT/ND Ext: no edema, RUE AVF +T/B  Recent Labs  Lab 08/04/24 0729 08/07/24 0220  NA 130* 132*  K 5.0 4.5  CL 88* 91*  CO2 27 31  GLUCOSE 112* 106*  BUN 90* 33*  CREATININE 5.44* 2.78*  ALBUMIN  2.7* 3.0*  CALCIUM  10.2 9.6  PHOS 4.8* 4.3   Liver Function Tests: Recent Labs  Lab 08/04/24 0729 08/07/24 0220  ALBUMIN  2.7* 3.0*   No results for input(s): LIPASE, AMYLASE in the last 168 hours. No results for input(s): AMMONIA in the last 168 hours. CBC: Recent Labs  Lab 08/04/24 0729 08/07/24 0220  WBC 5.0 3.8*  HGB 8.6* 9.1*  HCT 26.9* 29.6*  MCV 99.6 101.4*  PLT 146* 146*   Cardiac Enzymes: No results for input(s): CKTOTAL, CKMB, CKMBINDEX, TROPONINI in the last 168 hours. CBG: Recent Labs  Lab 08/07/24 0651 08/07/24 1512 08/07/24 2159 08/08/24 0344 08/08/24 0621  GLUCAP 100* 143* 136* 102* 117*    Iron  Studies: No results for input(s): IRON , TIBC, TRANSFERRIN, FERRITIN in the last 72 hours. Studies/Results: No results found.   amLODipine   5 mg Oral Daily   carvedilol   25 mg Per Tube BID WC   cinacalcet   30 mg Oral Q M,W,F   cycloSPORINE   200 mg Per Tube QPM   cycloSPORINE   225 mg Per Tube Daily    darbepoetin (ARANESP ) injection - DIALYSIS  200 mcg Subcutaneous Q Thu-1800   famotidine   10 mg Per Tube QHS   fiber supplement (BANATROL TF)  60 mL Per Tube BID   gabapentin   100 mg Per Tube Daily   Gerhardt's butt cream   Topical BID   hydrALAZINE   25 mg Per Tube Q8H   hydrocortisone    Rectal TID    HYDROmorphone  (DILAUDID ) injection  0.25 mg Intravenous Once   lacosamide   100 mg Per Tube BID   liver oil-zinc  oxide   Topical TID   losartan   25 mg Per Tube Q1200   multivitamin with minerals  1 tablet Per Tube Daily   mycophenolate   500 mg Per Tube BID   mouth rinse  15 mL Mouth Rinse 4 times per day   oxidized cellulose  1 each Topical Once   pantoprazole  (PROTONIX ) IV  40 mg Intravenous Daily   predniSONE   15 mg Per Tube Q breakfast    BMET    Component Value Date/Time   NA 132 (L) 08/07/2024 0220   K 4.5 08/07/2024 0220   CL 91 (L) 08/07/2024 0220   CO2 31 08/07/2024 0220   GLUCOSE 106 (H) 08/07/2024 0220   BUN 33 (  H) 08/07/2024 0220   CREATININE 2.78 (H) 08/07/2024 0220   CALCIUM  9.6 08/07/2024 0220   GFRNONAA 21 (L) 08/07/2024 0220   GFRAA  02/02/2022 9167    PATIENT IDENTIFICATION ERROR. PLEASE DISREGARD RESULTS. ACCOUNT WILL BE CREDITED.   CBC    Component Value Date/Time   WBC 3.8 (L) 08/07/2024 0220   RBC 2.92 (L) 08/07/2024 0220   HGB 9.1 (L) 08/07/2024 0220   HCT 29.6 (L) 08/07/2024 0220   PLT 146 (L) 08/07/2024 0220   MCV 101.4 (H) 08/07/2024 0220   MCH 31.2 08/07/2024 0220   MCHC 30.7 08/07/2024 0220   RDW 15.3 08/07/2024 0220   LYMPHSABS 0.9 06/24/2024 0827   MONOABS 0.5 06/24/2024 0827   EOSABS 0.1 06/24/2024 0827   BASOSABS 0.0 06/24/2024 0827    Dialysis Orders: 3:45hr, 400/A1.5, EDW 55.2kg, 2K/2Ca bath, AVF, no heparin  - Prev on Mircera 150mcg IV q 2 weeks - No VDRA   Assessment/Plan: Acute R intracranial hemorrhage: 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 Needs ongoing rehab. Doing better.  ESRD:  HD on MWF.  HD today.  HTN/volume: BP  somewhat labile.  Appearing euvolemic. Cont current meds.  Anemia of ESRD: Hgb 8s > 9s, transfuse prn. Continue Aranesp  200 q Thurs Secondary HPTH: CorrCa high  likely due to immobility, phos in goal. Continue sensipar , not on VDRA. 06/15/24 PTH 124, update pending Nutrition: Alb low, on nepro TF but has a regular diet ordered too. RD note 11/28 cal count intervention is to continue TF for now  - on another cal count currently Hx pancreas/kidney transplant: Remains on IS for pancreas function. Dispo: Insurance declined LTAC, and SNF, appeal pending. Tolerated HD in recliner on multiple days   Manuelita Barters MD [facility] Pager [phone]

## 2024-08-09 DIAGNOSIS — I619 Nontraumatic intracerebral hemorrhage, unspecified: Secondary | ICD-10-CM | POA: Diagnosis not present

## 2024-08-09 DIAGNOSIS — E440 Moderate protein-calorie malnutrition: Secondary | ICD-10-CM | POA: Diagnosis not present

## 2024-08-09 DIAGNOSIS — I610 Nontraumatic intracerebral hemorrhage in hemisphere, subcortical: Secondary | ICD-10-CM | POA: Diagnosis not present

## 2024-08-09 LAB — IRON AND TIBC
Iron: 81 ug/dL (ref 28–170)
Saturation Ratios: 31 % (ref 10.4–31.8)
TIBC: 259 ug/dL (ref 250–450)
UIBC: 178 ug/dL

## 2024-08-09 LAB — GLUCOSE, CAPILLARY
Glucose-Capillary: 101 mg/dL — ABNORMAL HIGH (ref 70–99)
Glucose-Capillary: 114 mg/dL — ABNORMAL HIGH (ref 70–99)

## 2024-08-09 LAB — FERRITIN: Ferritin: 615 ng/mL — ABNORMAL HIGH (ref 11–307)

## 2024-08-09 MED ORDER — AMLODIPINE BESYLATE 5 MG PO TABS
10.0000 mg | ORAL_TABLET | Freq: Every day | ORAL | Status: DC
  Administered 2024-08-10 – 2024-08-27 (×14): 10 mg via ORAL
  Filled 2024-08-09 (×14): qty 2

## 2024-08-09 NOTE — Progress Notes (Signed)
  [facility] Progress Note   Subjective:   Patient seen and examined at bedside with mother present.  Reports ongoing transient dizziness.  States it only lasts for a few seconds, then resolves.  They are worried it is related to anemia.  Agreed to check iron  studies with recent drop.  Also admits to very little PO intake and labile BP.  Reports she is full from nocturnal tube feeds and often doesn't feel like eating during the day.  Otherwise she is feeling pretty good.  Denies CP, SOB, abdominal pain, vomiting and diarrhea.  Some nausea this AM and cough.    Objective Vitals:   08/08/24 2000 08/09/24 0200 08/09/24 0804 08/09/24 1100  BP: (!) 148/73 (!) 143/74 (!) 174/77 (!) 168/70  Pulse: 78 76 88 78  Resp: 18 18 18    Temp: 98.7 F (37.1 C) 98.5 F (36.9 C) 97.9 F (36.6 C)   TempSrc: Oral Oral Oral   SpO2: 98% 99% 98%   Weight:      Height:       Physical Exam General:alert female in NAD Heart:RRR Lungs:CTAB, nml WOB on RA Abdomen:soft, NTND Extremities:no LE edema Dialysis Access: LU AVF   Filed Weights   08/08/24 0500 08/08/24 0830 08/08/24 1242  Weight: 54.3 kg 54.3 kg 52.9 kg    Intake/Output Summary (Last 24 hours) at 08/09/2024 1201 Last data filed at 08/08/2024 2000 Gross per 24 hour  Intake --  Output 1400 ml  Net -1400 ml    Additional Objective Labs: Basic Metabolic Panel: Recent Labs  Lab 08/04/24 0729 08/07/24 0220 08/08/24 0936  NA 130* 132* 132*  K 5.0 4.5 4.6  CL 88* 91* 91*  CO2 27 31 26   GLUCOSE 112* 106* 100*  BUN 90* 33* 57*  CREATININE 5.44* 2.78* 4.70*  CALCIUM  10.2 9.6  9.9 10.1  PHOS 4.8* 4.3 5.9*   Liver Function Tests: Recent Labs  Lab 08/04/24 0729 08/07/24 0220 08/08/24 0936  ALBUMIN  2.7* 3.0* 2.9*   CBC: Recent Labs  Lab 08/04/24 0729 08/07/24 0220 08/08/24 0935  WBC 5.0 3.8* 3.5*  NEUTROABS  --   --  2.3  HGB 8.6* 9.1* 8.6*  HCT 26.9* 29.6* 27.8*  MCV 99.6 101.4* 102.6*  PLT 146* 146* 131*    Blood Culture    Component Value Date/Time   SDES CSF 06/11/2024 0920   SPECREQUEST LP 06/11/2024 0920   CULT  06/11/2024 0920    NO GROWTH 3 DAYS Performed at [facility], [address] [place], [name] [address]    REPTSTATUS 06/14/2024 FINAL 06/11/2024 0920   CBG: Recent Labs  Lab 08/08/24 0344 08/08/24 0621 08/08/24 1422 08/08/24 2356 08/09/24 0734  GLUCAP 102* 117* 110* 115* 101*   Iron  Studies:  Recent Labs    08/09/24 1036  IRON  81  TIBC 259  FERRITIN 615*   Lab Results  Component Value Date   INR 1.1 06/18/2024   INR 1.1 06/16/2024   INR 1.1 05/20/2024   Studies/Results: No results found.  Medications:  feeding supplement (NEPRO CARB STEADY) Stopped (08/08/24 0636)    amLODipine   5 mg Oral Daily   carvedilol   25 mg Oral BID WC   cinacalcet   30 mg Oral Q M,W,F   cycloSPORINE   200 mg Oral QPM   cycloSPORINE   225 mg Oral Daily   darbepoetin (ARANESP ) injection - DIALYSIS  200 mcg Subcutaneous Q Thu-1800   famotidine   10 mg Oral QHS   fiber supplement (BANATROL  TF)  60 mL Per Tube BID   gabapentin   100 mg Oral Daily   Gerhardt's butt cream   Topical BID   hydrALAZINE   25 mg Oral Q8H   hydrocortisone    Rectal TID    HYDROmorphone  (DILAUDID ) injection  0.25 mg Intravenous Once   lacosamide   100 mg Oral BID   liver oil-zinc  oxide   Topical TID   losartan   25 mg Oral Q1200   multivitamin with minerals  1 tablet Oral Daily   mycophenolate   500 mg Oral BID   mouth rinse  15 mL Mouth Rinse 4 times per day   oxidized cellulose  1 each Topical Once   pantoprazole  (PROTONIX ) IV  40 mg Intravenous Daily   predniSONE   15 mg Oral Q breakfast    Dialysis Orders: 3:45hr, 400/A1.5, EDW 55.2kg, 2K/2Ca bath, AVF, no heparin  - Prev on Mircera 150mcg IV q 2 weeks - No VDRA   Assessment/Plan: Acute R intracranial hemorrhage: 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 Needs ongoing rehab. Doing a lot better.  ESRD:  HD on MWF.  Next HD on 08/11/24.   HTN/volume: BP somewhat labile - elevated today.  Appearing euvolemic. Cont current meds - carvedilol  25mg  BID, hydralazine  25mg  TID, losartan  25mg  every day, and amlodipine  5mg  every day added yesterday.  Anemia of ESRD: Hgb 8s > 9s, transfuse prn. Continue Aranesp  200 q Thurs. Checking iron  studies.  Secondary HPTH: CorrCa high  likely due to immobility, phos elevated today. Not on a binder.  If remains elevated will need to add one.  Continue sensipar , not on VDRA. Pth 126 on 08/07/24.  Nutrition: Alb low, on nepro TF at night but has a regular diet ordered too. RD note 11/28 cal count intervention is to continue TF for now  - on another cal count currently Hx pancreas/kidney transplant: Remains on IS for pancreas function. Dispo: Insurance declined LTAC, and SNF, appeal pending. Tolerated HD in recliner on multiple days  Manuelita Labella, PA-C [facility] 08/09/2024,12:01 PM  LOS: 82 days

## 2024-08-09 NOTE — Plan of Care (Signed)
  Problem: Health Behavior/Discharge Planning: Goal: Ability to identify and utilize available resources and services will improve Outcome: Progressing Goal: Ability to manage health-related needs will improve Outcome: Progressing   Problem: Skin Integrity: Goal: Risk for impaired skin integrity will decrease Outcome: Progressing   Problem: Education: Goal: Knowledge of disease or condition will improve Outcome: Progressing Goal: Knowledge of secondary prevention will improve (MUST DOCUMENT ALL) Outcome: Progressing Goal: Knowledge of patient specific risk factors will improve (DELETE if not current risk factor) Outcome: Progressing   Problem: Intracerebral Hemorrhage Tissue Perfusion: Goal: Complications of Intracerebral Hemorrhage will be minimized Outcome: Progressing   Problem: Health Behavior/Discharge Planning: Goal: Ability to manage health-related needs will improve Outcome: Progressing Goal: Goals will be collaboratively established with patient/family Outcome: Progressing   Problem: Nutrition: Goal: Risk of aspiration will decrease Outcome: Progressing Goal: Dietary intake will improve Outcome: Progressing   Problem: Education: Goal: Knowledge of General Education information will improve Description: Including pain rating scale, medication(s)/side effects and non-pharmacologic comfort measures Outcome: Progressing   Problem: Health Behavior/Discharge Planning: Goal: Ability to manage health-related needs will improve Outcome: Progressing   Problem: Clinical Measurements: Goal: Ability to maintain clinical measurements within normal limits will improve Outcome: Progressing Goal: Will remain free from infection Outcome: Progressing Goal: Diagnostic test results will improve Outcome: Progressing   Problem: Activity: Goal: Risk for activity intolerance will decrease Outcome: Progressing   Problem: Nutrition: Goal: Adequate nutrition will be  maintained Outcome: Progressing   Problem: Elimination: Goal: Will not experience complications related to bowel motility Outcome: Progressing   Problem: Pain Managment: Goal: General experience of comfort will improve and/or be controlled Outcome: Progressing   Problem: Safety: Goal: Ability to remain free from injury will improve Outcome: Progressing   Problem: Skin Integrity: Goal: Risk for impaired skin integrity will decrease Outcome: Progressing   Problem: Education: Goal: Knowledge of the prescribed therapeutic regimen will improve Outcome: Progressing   Problem: Clinical Measurements: Goal: Usual level of consciousness will be regained or maintained. Outcome: Progressing Goal: Neurologic status will improve Outcome: Progressing Goal: Ability to maintain intracranial pressure will improve Outcome: Progressing   Problem: Skin Integrity: Goal: Demonstration of wound healing without infection will improve Outcome: Progressing   Problem: Education: Goal: Knowledge of disease or condition will improve Outcome: Progressing Goal: Knowledge of secondary prevention will improve (MUST DOCUMENT ALL) Outcome: Progressing Goal: Knowledge of patient specific risk factors will improve (DELETE if not current risk factor) Outcome: Progressing   Problem: Intracerebral Hemorrhage Tissue Perfusion: Goal: Complications of Intracerebral Hemorrhage will be minimized Outcome: Progressing   Problem: Health Behavior/Discharge Planning: Goal: Ability to manage health-related needs will improve Outcome: Progressing Goal: Goals will be collaboratively established with patient/family Outcome: Progressing   Problem: Nutrition: Goal: Risk of aspiration will decrease Outcome: Progressing Goal: Dietary intake will improve Outcome: Progressing

## 2024-08-09 NOTE — Plan of Care (Signed)
  Problem: Health Behavior/Discharge Planning: Goal: Ability to identify and utilize available resources and services will improve Outcome: Progressing Goal: Ability to manage health-related needs will improve Outcome: Progressing   Problem: Skin Integrity: Goal: Risk for impaired skin integrity will decrease Outcome: Progressing   Problem: Education: Goal: Knowledge of disease or condition will improve Outcome: Progressing Goal: Knowledge of secondary prevention will improve (MUST DOCUMENT ALL) Outcome: Progressing Goal: Knowledge of patient specific risk factors will improve (DELETE if not current risk factor) Outcome: Progressing   Problem: Intracerebral Hemorrhage Tissue Perfusion: Goal: Complications of Intracerebral Hemorrhage will be minimized Outcome: Progressing   Problem: Health Behavior/Discharge Planning: Goal: Ability to manage health-related needs will improve Outcome: Progressing Goal: Goals will be collaboratively established with patient/family Outcome: Progressing   Problem: Nutrition: Goal: Risk of aspiration will decrease Outcome: Progressing Goal: Dietary intake will improve Outcome: Progressing   Problem: Education: Goal: Knowledge of General Education information will improve Description: Including pain rating scale, medication(s)/side effects and non-pharmacologic comfort measures Outcome: Progressing   Problem: Health Behavior/Discharge Planning: Goal: Ability to manage health-related needs will improve Outcome: Progressing   Problem: Clinical Measurements: Goal: Ability to maintain clinical measurements within normal limits will improve Outcome: Progressing Goal: Will remain free from infection Outcome: Progressing Goal: Diagnostic test results will improve Outcome: Progressing   Problem: Activity: Goal: Risk for activity intolerance will decrease Outcome: Progressing   Problem: Nutrition: Goal: Adequate nutrition will be  maintained Outcome: Progressing   Problem: Elimination: Goal: Will not experience complications related to bowel motility Outcome: Progressing   Problem: Pain Managment: Goal: General experience of comfort will improve and/or be controlled Outcome: Progressing   Problem: Safety: Goal: Ability to remain free from injury will improve Outcome: Progressing   Problem: Skin Integrity: Goal: Risk for impaired skin integrity will decrease Outcome: Progressing   Problem: Education: Goal: Knowledge of the prescribed therapeutic regimen will improve Outcome: Progressing   Problem: Clinical Measurements: Goal: Usual level of consciousness will be regained or maintained. Outcome: Progressing Goal: Neurologic status will improve Outcome: Progressing Goal: Ability to maintain intracranial pressure will improve Outcome: Progressing   Problem: Skin Integrity: Goal: Demonstration of wound healing without infection will improve Outcome: Progressing   Problem: Education: Goal: Knowledge of disease and its progression will improve Outcome: Progressing   Problem: Health Behavior/Discharge Planning: Goal: Ability to manage health-related needs will improve Outcome: Progressing   Problem: Education: Goal: Knowledge of disease or condition will improve Outcome: Progressing Goal: Knowledge of secondary prevention will improve (MUST DOCUMENT ALL) Outcome: Progressing Goal: Knowledge of patient specific risk factors will improve (DELETE if not current risk factor) Outcome: Progressing   Problem: Intracerebral Hemorrhage Tissue Perfusion: Goal: Complications of Intracerebral Hemorrhage will be minimized Outcome: Progressing   Problem: Health Behavior/Discharge Planning: Goal: Ability to manage health-related needs will improve Outcome: Progressing Goal: Goals will be collaboratively established with patient/family Outcome: Progressing   Problem: Nutrition: Goal: Risk of aspiration  will decrease Outcome: Progressing Goal: Dietary intake will improve Outcome: Progressing   Problem: Consults Goal: RH STROKE PATIENT EDUCATION Description: See Patient Education module for education specifics  Outcome: Progressing   Problem: RH Vision Goal: RH LTG Vision (Specify) Outcome: Progressing

## 2024-08-09 NOTE — Progress Notes (Signed)
 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  Patient was noted to have hemorrhagic stroke.  Neurology, neurosurgery in critical care were consulted.  EVD was placed on the left side and was initially admitted to neuro ICU.  She was also intubated and had prolonged intubation.  Subsequently, patient was extubated on 05/27/2024.   Important events:  9/15 admitted with ICH plus IVH, EVD was placed for hydrocephalus, intubated 9/22 palliative care meeting.  Family is insistent that they want full scope of care 9/23 Extubated 10/1 -developed fevers pancultures drawn and broad-spectrum antibiotics started 10/2 became afebrile after starting antibiotics, did not tolerate raising EVD 10/6 EVD was raised from 10 to 30 cm water , tolerating well, remained afebrile 10/7 EVD is at 30 cm water , CT scan will be done tomorrow morning, no overnight issues 10/8 she became somnolent, CT head showed increasing hydrocephalus, EVD was titrated down to 10 cm of water  with improvement in mental status 10/15 VP shunt placement 10/17 transferred to progressive unit with [name]  11/18-medically stable.  Awaiting for disposition.  Insurance declined LTAC and skilled nursing facility, appeal pending. 12/3-tube feeding changed to nocturnal tube feeds, started on p.o. diet    Assessment/Plan:   Acute right parietotemporal intraparenchymal hemorrhage / Intraventricular hemorrhage: Uncontrolled hypertension: Obstructive hydrocephalus s/p EVD changed to VP shunt: Seizure disorder S/p VP shunt placement on 06/18/2024  by Dr. Mavis.  Continue Vimpat .   Neurosurgery intermittently followed the patient during hospitalization.   Currently stable   ESRD on dialysis: History of failed renal transplant: Secondary hyperparathyroidism: Nephrology on board.  Continue CellCept  and prednisone .   On Monday, Wednesday, Friday hemodialysis schedule.   TOC is working on finding outpatient hemodialysis set up  Hyponatremia - Chronic, nephrology following -Patient on hemodialysis    History of pancreatic/kidney transplantation:  Continue cyclosporine , mycophenolate , and prednisone .   Hypertension:  -Blood pressure is mildly elevated -Continue Coreg  25 mg p.o. twice daily -Will increase amlodipine  to 10 mg daily  Dizziness - Unclear etiology -Symptoms mainly on standing from sitting position -?  Orthostatic hypotension; check orthostatic vital signs    Anemia of chronic disease:  Latest hemoglobin  8.5.. Transfuse if Hb below 7.0   Type 1 diabetes with vasculopathy/gastroparesis:  Not on long-term insulin  anymore due to pancreatic transplant.  -She is currently n.p.o. for swallow evaluation/MBS Check CBG every 8 hours   Moderate protein calorie malnutrition/dysphagia:  On PEG tube feeding..   Patient has been having abdominal discomfort - Daytime tube feeding has been stopped; continue nocturnal tube feeds 7P to 7 A - Continue p.o. intake during daytime; appetite has improved - Nausea and vomiting has resolved    Debility/deconditioning/disposition/goals of care:  Palliative care had seen the patient during hospitalization.  Family has insisted full code.   Palliative care has signed off 10/17.  Disposition pending.  TOC on board.  Patient has been seen by physical therapy during hospitalization and patient requiring significant help      DVT prophylaxis: SCDs  Medications     amLODipine   5 mg Oral Daily   carvedilol   25 mg Oral BID WC   cinacalcet   30  mg Oral Q M,W,F   cycloSPORINE    200 mg Oral QPM   cycloSPORINE   225 mg Oral Daily   darbepoetin (ARANESP ) injection - DIALYSIS  200 mcg Subcutaneous Q Thu-1800   famotidine   10 mg Oral QHS   fiber supplement (BANATROL TF)  60 mL Per Tube BID   gabapentin   100 mg Oral Daily   Gerhardt's butt cream   Topical BID   hydrALAZINE   25 mg Oral Q8H   hydrocortisone    Rectal TID    HYDROmorphone  (DILAUDID ) injection  0.25 mg Intravenous Once   lacosamide   100 mg Oral BID   liver oil-zinc  oxide   Topical TID   losartan   25 mg Oral Q1200   multivitamin with minerals  1 tablet Oral Daily   mycophenolate   500 mg Oral BID   mouth rinse  15 mL Mouth Rinse 4 times per day   oxidized cellulose  1 each Topical Once   pantoprazole  (PROTONIX ) IV  40 mg Intravenous Daily   predniSONE   15 mg Oral Q breakfast     Data Reviewed:   CBG:  Recent Labs  Lab 08/08/24 0344 08/08/24 0621 08/08/24 1422 08/08/24 2356 08/09/24 0734  GLUCAP 102* 117* 110* 115* 101*    SpO2: 99 % O2 Flow Rate (L/min): 100 L/min FiO2 (%): 40 %    Vitals:   08/08/24 1345 08/08/24 1612 08/08/24 2000 08/09/24 0200  BP: (!) 172/69 (!) 147/69 (!) 148/73 (!) 143/74  Pulse: 86 86 78 76  Resp: 18 17 18 18   Temp: 98.6 F (37 C) 98.8 F (37.1 C) 98.7 F (37.1 C) 98.5 F (36.9 C)  TempSrc: Oral Oral Oral Oral  SpO2: 97% 100% 98% 99%  Weight:      Height:          Data Reviewed:  Basic Metabolic Panel: Recent Labs  Lab 08/04/24 0729 08/07/24 0220 08/08/24 0936  NA 130* 132* 132*  K 5.0 4.5 4.6  CL 88* 91* 91*  CO2 27 31 26   GLUCOSE 112* 106* 100*  BUN 90* 33* 57*  CREATININE 5.44* 2.78* 4.70*  CALCIUM  10.2 9.6  9.9 10.1  PHOS 4.8* 4.3 5.9*    CBC: Recent Labs  Lab 08/04/24 0729 08/07/24 0220 08/08/24 0935  WBC 5.0 3.8* 3.5*  NEUTROABS  --   --  2.3  HGB 8.6* 9.1* 8.6*  HCT 26.9* 29.6* 27.8*  MCV 99.6 101.4* 102.6*  PLT 146* 146* 131*    LFT Recent Labs  Lab 08/04/24 0729 08/07/24 0220 08/08/24 0936  ALBUMIN  2.7* 3.0*  2.9*     Antibiotics: Anti-infectives (From admission, onward)    Start     Dose/Rate Route Frequency Ordered Stop   07/08/24 1530  fluconazole  (DIFLUCAN ) tablet 150 mg        150 mg Oral  Once 07/08/24 1437 07/08/24 1544   06/18/24 2030  ceFAZolin  (ANCEF ) IVPB 2g/100 mL premix  Status:  Discontinued        2 g 200 mL/hr over 30 Minutes Intravenous Every 8 hours 06/18/24 1454 06/19/24 1608   06/18/24 0600  ceFAZolin  (ANCEF ) IVPB 2g/100 mL premix        2 g 200 mL/hr over 30 Minutes Intravenous On call to O.R. 06/15/24 1152 06/18/24 1230   06/06/24 1400  piperacillin -tazobactam (ZOSYN ) IVPB 2.25 g        2.25 g 100 mL/hr over 30 Minutes Intravenous Every 8 hours 06/06/24 1017 06/09/24 0538   06/06/24 1200  vancomycin  (VANCOREADY)  IVPB 500 mg/100 mL        500 mg 100 mL/hr over 60 Minutes Intravenous Every M-W-F (Hemodialysis) 06/04/24 1745 06/06/24 1900   06/04/24 1845  vancomycin  (VANCOCIN ) IVPB 1000 mg/200 mL premix        1,000 mg 200 mL/hr over 60 Minutes Intravenous  Once 06/04/24 1745 06/04/24 1915   06/04/24 1830  ceFEPIme  (MAXIPIME ) 2 g in sodium chloride  0.9 % 100 mL IVPB  Status:  Discontinued        2 g 200 mL/hr over 30 Minutes Intravenous Every M-W-F (Hemodialysis) 06/04/24 1743 06/06/24 1017   05/27/24 1015  cefTRIAXone  (ROCEPHIN ) 2 g in sodium chloride  0.9 % 100 mL IVPB        2 g 200 mL/hr over 30 Minutes Intravenous Every 24 hours 05/27/24 0918 05/29/24 1014   05/23/24 1530  ceFEPIme  (MAXIPIME ) 1 g in sodium chloride  0.9 % 100 mL IVPB  Status:  Discontinued        1 g 200 mL/hr over 30 Minutes Intravenous Every 24 hours 05/23/24 1433 05/27/24 0918   05/22/24 2200  Ampicillin -Sulbactam (UNASYN ) 3 g in sodium chloride  0.9 % 100 mL IVPB  Status:  Discontinued        3 g 200 mL/hr over 30 Minutes Intravenous Every 24 hours 05/22/24 0834 05/23/24 1433   05/22/24 0930  Ampicillin -Sulbactam (UNASYN ) 3 g in sodium chloride  0.9 % 100 mL IVPB        3 g 200 mL/hr over 30  Minutes Intravenous  Once 05/22/24 0834 05/22/24 1900        CONSULTS neurosurgery, palliative care  Code Status: Full code  Family Communication:      Subjective   Complains of dizziness on standing.  Denies nausea, vomiting or diarrhea  Objective    Physical Examination:  Appears in no acute distress S1-S2, regular, no murmur auscultated Lungs clear to auscultation bilaterally Abdomen is soft, nontender, no organomegaly   [name] [name] S [name] [name]   [name] If 7PM-7AM, please contact night-coverage at [email or web address], Office  [phone]   08/09/2024, 7:56 AM  LOS: 82 days

## 2024-08-10 LAB — GLUCOSE, CAPILLARY
Glucose-Capillary: 85 mg/dL (ref 70–99)
Glucose-Capillary: 101 mg/dL — ABNORMAL HIGH (ref 70–99)
Glucose-Capillary: 161 mg/dL — ABNORMAL HIGH (ref 70–99)
Glucose-Capillary: 124 mg/dL — ABNORMAL HIGH (ref 70–99)

## 2024-08-10 LAB — HEMOGLOBIN A1C
Hgb A1c MFr Bld: 3.9 % — ABNORMAL LOW (ref 4.8–5.6)
Mean Plasma Glucose: 65.23 mg/dL

## 2024-08-10 MED ORDER — PANTOPRAZOLE SODIUM 40 MG PO TBEC
40.0000 mg | DELAYED_RELEASE_TABLET | Freq: Every day | ORAL | Status: DC
  Administered 2024-08-11 – 2024-08-27 (×17): 40 mg via ORAL
  Filled 2024-08-10 (×17): qty 1

## 2024-08-10 MED ORDER — GERHARDT'S BUTT CREAM
TOPICAL_CREAM | Freq: Two times a day (BID) | CUTANEOUS | Status: DC | PRN
  Administered 2024-08-19: 10:00:00 1 via TOPICAL
  Filled 2024-08-10 (×2): qty 60

## 2024-08-10 MED ORDER — ZINC OXIDE 40 % EX OINT
TOPICAL_OINTMENT | Freq: Three times a day (TID) | CUTANEOUS | Status: DC | PRN
  Filled 2024-08-10: qty 57

## 2024-08-10 NOTE — Plan of Care (Signed)
  Problem: Health Behavior/Discharge Planning: Goal: Ability to manage health-related needs will improve Outcome: Completed/Met   Problem: Clinical Measurements: Goal: Diagnostic test results will improve Outcome: Completed/Met   Problem: Education: Goal: Knowledge of the prescribed therapeutic regimen will improve Outcome: Completed/Met   Problem: Clinical Measurements: Goal: Usual level of consciousness will be regained or maintained. Outcome: Completed/Met Goal: Neurologic status will improve Outcome: Completed/Met Goal: Ability to maintain intracranial pressure will improve Outcome: Completed/Met   Problem: Skin Integrity: Goal: Demonstration of wound healing without infection will improve Outcome: Completed/Met   Problem: Intracerebral Hemorrhage Tissue Perfusion: Goal: Complications of Intracerebral Hemorrhage will be minimized Outcome: Completed/Met   Problem: Health Behavior/Discharge Planning: Goal: Ability to manage health-related needs will improve Outcome: Completed/Met

## 2024-08-10 NOTE — Progress Notes (Signed)
 Brief Nutrition Follow-Up Note  Case discussed with RN and MD. Per MD, patient refused nocturnal tube feeds last night. Reviewed chart; patient is hopeful that her oral intake has improved.   Last RD note on 08/01/24. Calorie count was completed and at that time, RD recommended continuing nocturnal feedings due to insufficient oral intake. Per chart, it appears that nocturnal feeds were initiated in 08/06/24.   Patient currently on a regular diet. Noted meal completions documented at 25-100% of meals from 08/09/24 at lunch to 08/10/24 at breakfast. Per discussion with RN, patient consumed 100% of breakfast and meal ticket has been saved for assessment. Given improvements in oral intake and refusal of nocturnal feedings, RD feels that repeat calorie count is reasonable to determine if changes to TF regimen can be made.   Medications reviewed and include sensipar , aranesp , neurontin , and prednisone .  NUTRITION DIAGNOSIS:  Moderate Malnutrition related to chronic illness (ESRD on HD/DM and gastroparesis) as evidenced by severe muscle depletion, mild fat depletion. - remains applicable   GOAL:  Patient will meet greater than or equal to 90% of their needs - progressing  INTERVENTION:  Continue tube feeding via G-J tube: Nepro at 45 ml/h (1080 ml per day) from 1900-0700 Provides 810 kcal, 36 gm protein, 327 ml free water  daily Banatrol TID  MVI daily 48 hour calorie count to determine possible changes in TF regimen/ discontinuation of TF  Margery ORN, RD, LDN, CDCES Registered Dietitian III Certified Diabetes Care and Education Specialist If unable to reach this RD, please use RD Inpatient group chat on secure chat between hours of 8am-4 pm daily

## 2024-08-10 NOTE — Plan of Care (Signed)
  Problem: Health Behavior/Discharge Planning: Goal: Ability to identify and utilize available resources and services will improve Outcome: Progressing Goal: Ability to manage health-related needs will improve Outcome: Progressing   Problem: Skin Integrity: Goal: Risk for impaired skin integrity will decrease Outcome: Progressing   Problem: Education: Goal: Knowledge of disease or condition will improve Outcome: Progressing Goal: Knowledge of secondary prevention will improve (MUST DOCUMENT ALL) Outcome: Progressing Goal: Knowledge of patient specific risk factors will improve (DELETE if not current risk factor) Outcome: Progressing   Problem: Intracerebral Hemorrhage Tissue Perfusion: Goal: Complications of Intracerebral Hemorrhage will be minimized Outcome: Progressing   Problem: Health Behavior/Discharge Planning: Goal: Goals will be collaboratively established with patient/family Outcome: Progressing   Problem: Nutrition: Goal: Risk of aspiration will decrease Outcome: Progressing Goal: Dietary intake will improve Outcome: Progressing   Problem: Education: Goal: Knowledge of General Education information will improve Description: Including pain rating scale, medication(s)/side effects and non-pharmacologic comfort measures Outcome: Progressing   Problem: Health Behavior/Discharge Planning: Goal: Ability to manage health-related needs will improve Outcome: Progressing   Problem: Clinical Measurements: Goal: Ability to maintain clinical measurements within normal limits will improve Outcome: Progressing Goal: Will remain free from infection Outcome: Progressing   Problem: Activity: Goal: Risk for activity intolerance will decrease Outcome: Progressing   Problem: Nutrition: Goal: Adequate nutrition will be maintained Outcome: Progressing   Problem: Elimination: Goal: Will not experience complications related to bowel motility Outcome: Progressing   Problem:  Pain Managment: Goal: General experience of comfort will improve and/or be controlled Outcome: Progressing   Problem: Safety: Goal: Ability to remain free from injury will improve Outcome: Progressing   Problem: Skin Integrity: Goal: Risk for impaired skin integrity will decrease Outcome: Progressing   Problem: Education: Goal: Knowledge of disease or condition will improve Outcome: Progressing Goal: Knowledge of secondary prevention will improve (MUST DOCUMENT ALL) Outcome: Progressing Goal: Knowledge of patient specific risk factors will improve (DELETE if not current risk factor) Outcome: Progressing   Problem: Health Behavior/Discharge Planning: Goal: Goals will be collaboratively established with patient/family Outcome: Progressing   Problem: Nutrition: Goal: Risk of aspiration will decrease Outcome: Progressing Goal: Dietary intake will improve Outcome: Progressing

## 2024-08-10 NOTE — Progress Notes (Signed)
 [facility] Progress Note   Subjective:   Patient seen and examined at bedside with mother present. TF were held last PM and has regular diet.  Hopeful she can maintain caloric intake.   Objective Vitals:   08/09/24 1736 08/09/24 2023 08/10/24 0000 08/10/24 0400  BP: (!) 150/81 (!) 158/72 (!) 155/70 (!) 160/74  Pulse:  78 80 72  Resp:  15 14 15   Temp:  98.8 F (37.1 C) 98.7 F (37.1 C) 98.6 F (37 C)  TempSrc:  Oral Oral Oral  SpO2:  100% 100% 100%  Weight:      Height:       Physical Exam General:alert female in NAD Heart:RRR Lungs:CTAB, nml WOB on RA Abdomen:soft, NTND Extremities:no LE edema Dialysis Access: LU AVF   Filed Weights   08/08/24 0500 08/08/24 0830 08/08/24 1242  Weight: 54.3 kg 54.3 kg 52.9 kg    Intake/Output Summary (Last 24 hours) at 08/10/2024 0733 Last data filed at 08/10/2024 0500 Gross per 24 hour  Intake 600 ml  Output 0 ml  Net 600 ml    Additional Objective Labs: Basic Metabolic Panel: Recent Labs  Lab 08/04/24 0729 08/07/24 0220 08/08/24 0936  NA 130* 132* 132*  K 5.0 4.5 4.6  CL 88* 91* 91*  CO2 27 31 26   GLUCOSE 112* 106* 100*  BUN 90* 33* 57*  CREATININE 5.44* 2.78* 4.70*  CALCIUM  10.2 9.6  9.9 10.1  PHOS 4.8* 4.3 5.9*   Liver Function Tests: Recent Labs  Lab 08/04/24 0729 08/07/24 0220 08/08/24 0936  ALBUMIN  2.7* 3.0* 2.9*   CBC: Recent Labs  Lab 08/04/24 0729 08/07/24 0220 08/08/24 0935  WBC 5.0 3.8* 3.5*  NEUTROABS  --   --  2.3  HGB 8.6* 9.1* 8.6*  HCT 26.9* 29.6* 27.8*  MCV 99.6 101.4* 102.6*  PLT 146* 146* 131*   Blood Culture    Component Value Date/Time   SDES CSF 06/11/2024 0920   SPECREQUEST LP 06/11/2024 0920   CULT  06/11/2024 0920    NO GROWTH 3 DAYS Performed at [facility], [address] [place], [name] [address]    REPTSTATUS 06/14/2024 FINAL 06/11/2024 0920   CBG: Recent Labs  Lab 08/08/24 1422 08/08/24 2356 08/09/24 0734 08/09/24 2028  08/10/24 0311  GLUCAP 110* 115* 101* 114* 85   Iron  Studies:  Recent Labs    08/09/24 1036  IRON  81  TIBC 259  FERRITIN 615*   Lab Results  Component Value Date   INR 1.1 06/18/2024   INR 1.1 06/16/2024   INR 1.1 05/20/2024   Studies/Results: No results found.  Medications:  feeding supplement (NEPRO CARB STEADY) Stopped (08/08/24 0636)    amLODipine   10 mg Oral Daily   carvedilol   25 mg Oral BID WC   cinacalcet   30 mg Oral Q M,W,F   cycloSPORINE   200 mg Oral QPM   cycloSPORINE   225 mg Oral Daily   darbepoetin (ARANESP ) injection - DIALYSIS  200 mcg Subcutaneous Q Thu-1800   famotidine   10 mg Oral QHS   fiber supplement (BANATROL TF)  60 mL Per Tube BID   gabapentin   100 mg Oral Daily   hydrALAZINE   25 mg Oral Q8H   hydrocortisone    Rectal TID    HYDROmorphone  (DILAUDID ) injection  0.25 mg Intravenous Once   lacosamide   100 mg Oral BID   losartan   25 mg Oral Q1200   multivitamin with minerals  1 tablet Oral Daily   mycophenolate   500 mg Oral BID   oxidized cellulose  1 each Topical Once   pantoprazole  (PROTONIX ) IV  40 mg Intravenous Daily   predniSONE   15 mg Oral Q breakfast    Dialysis Orders: 3:45hr, 400/A1.5, EDW 55.2kg, 2K/2Ca bath, AVF, no heparin  - Prev on Mircera 150mcg IV q 2 weeks - No VDRA   Assessment/Plan: Acute R intracranial hemorrhage: 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 Needs ongoing rehab. Doing a lot better.  ESRD:  HD on MWF.  Next HD on 08/11/24.  HTN/volume: BP somewhat labile - elevated again today.  Appearing euvolemic. Cont current meds - carvedilol  25mg  BID, hydralazine  25mg  TID, losartan  25mg  every day.  Amlodipine  was titrated to 10 today.  UF with HD tomorrow.  Anemia of ESRD: Hgb 8s > 9s, transfuse prn. Continue Aranesp  200 q Thurs. 12/6 iron  sat 31%.  Secondary HPTH: CorrCa high  likely due to immobility, phos elevated today. Not on a binder.  If remains elevated will need to add one.  Continue sensipar , not on VDRA. Pth 126 on  08/07/24.  Nutrition: Alb low, on nepro TF at night but this was held overnight due feeling full + on another calorie count.  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 Dispo: Insurance declined LTAC, and SNF, appeal pending. Tolerated HD in recliner on multiple days  Manuelita Barters MD [facility] Pager [phone]

## 2024-08-10 NOTE — Progress Notes (Signed)
 [facility]  PROGRESS NOTE  [name] [name] FMW:[id number] DOB: Jul 29, 1981 DOA: 05/19/2024 PCP: [facility]   Brief HPI:    43-yrs-old female with past medical history of hypertension, CVA, type 1 diabetes, gastroparesis, kidney and pancreatic transplant 2014 with subsequent failure of renal transplant now on end-stage renal dialysis, anxiety, narcotic abuse presented to the hospital from dialysis unit on 05/19/2024 as code stroke.  Patient was noted to have hemorrhagic stroke.  Neurology, neurosurgery in critical care were consulted.  EVD was placed on the left side and was initially admitted to neuro ICU.  She was also intubated and had prolonged intubation.  Subsequently, patient was extubated on 05/27/2024.   Important events:  9/15 admitted with ICH plus IVH, EVD was placed for hydrocephalus, intubated 9/22 palliative care meeting.  Family is insistent that they want full scope of care 9/23 Extubated 10/1 -developed fevers pancultures drawn and broad-spectrum antibiotics started 10/2 became afebrile after starting antibiotics, did not tolerate raising EVD 10/6 EVD was raised from 10 to 30 cm water , tolerating well, remained afebrile 10/7 EVD is at 30 cm water , CT scan will be done tomorrow morning, no overnight issues 10/8 she became somnolent, CT head showed increasing hydrocephalus, EVD was titrated down to 10 cm of water  with improvement in mental status 10/15 VP shunt placement 10/17 transferred to progressive unit with [name]  11/18-medically stable.  Awaiting for disposition.  Insurance declined LTAC and skilled nursing facility, appeal pending. 12/3-tube feeding changed to nocturnal tube feeds, started on p.o. diet    Assessment/Plan:   Acute right parietotemporal intraparenchymal hemorrhage / Intraventricular hemorrhage: Uncontrolled hypertension: Obstructive hydrocephalus s/p EVD changed to VP shunt: Seizure disorder S/p VP shunt placement on 06/18/2024  by Dr. Mavis.  Continue Vimpat .   Neurosurgery intermittently followed the patient during hospitalization.   Left forehead stitch, will discuss with nsgy   ESRD on dialysis: History of failed renal transplant: Secondary hyperparathyroidism: Nephrology on board.  Continue CellCept  and prednisone .   On Monday, Wednesday, Friday hemodialysis schedule.   TOC is working on finding outpatient hemodialysis set up  Hyponatremia trend  History of pancreatic/kidney transplantation:  Continue cyclosporine , mycophenolate , and prednisone .   Hypertension:  -Blood pressure is mildly elevated -Continue Coreg  25 mg p.o. twice daily -amlodipine  10 mg   Dizziness - Unclear etiology -Symptoms mainly on standing from sitting position -follow orthostatics  Anemia of chronic disease:  Latest hemoglobin  8.5.. Transfuse if Hb below 7.0   Type 1 diabetes with vasculopathy/gastroparesis:  Not on long-term insulin  anymore due to pancreatic transplant.  A1c 3.9 Follow BG intermittently on BMP    Moderate protein calorie malnutrition/dysphagia:  On PEG tube feeding..   Taking PO diet as well - on calorie count RD to follow calorie count to determine if changes to nightly tube feed regimen can be made   Debility/deconditioning/disposition/goals of care:  Palliative care had seen the patient during hospitalization.  Palliative care has signed off 10/17.  Disposition pending.  TOC on board.    DVT prophylaxis: SCDs  Medications     amLODipine   10 mg Oral Daily   carvedilol   25 mg Oral BID WC   cinacalcet   30 mg Oral Q M,W,F   cycloSPORINE   200 mg Oral QPM   cycloSPORINE   225 mg Oral Daily   darbepoetin (ARANESP ) injection - DIALYSIS  200 mcg Subcutaneous Q Thu-1800   famotidine   10 mg Oral QHS   fiber supplement (BANATROL TF)  60 mL Per Tube BID  gabapentin   100 mg Oral Daily   hydrALAZINE   25 mg Oral Q8H   hydrocortisone    Rectal TID    HYDROmorphone  (DILAUDID ) injection  0.25 mg  Intravenous Once   lacosamide   100 mg Oral BID   losartan   25 mg Oral Q1200   multivitamin with minerals  1 tablet Oral Daily   mycophenolate   500 mg Oral BID   oxidized cellulose  1 each Topical Once   [START ON 08/11/2024] pantoprazole   40 mg Oral Daily   predniSONE   15 mg Oral Q breakfast     Data Reviewed:   CBG:  Recent Labs  Lab 08/09/24 0734 08/09/24 2028 08/10/24 0311 08/10/24 0827 08/10/24 1148  GLUCAP 101* 114* 85 101* 161*    SpO2: 97 % O2 Flow Rate (L/min): 100 L/min FiO2 (%): 40 %    Vitals:   08/10/24 0000 08/10/24 0400 08/10/24 0800 08/10/24 1150  BP: (!) 155/70 (!) 160/74  (!) 160/69  Pulse: 80 72  88  Resp: 14 15 18 16   Temp: 98.7 F (37.1 C) 98.6 F (37 C) 98.7 F (37.1 C) 99.2 F (37.3 C)  TempSrc: Oral Oral Oral Oral  SpO2: 100% 100% 99% 97%  Weight:      Height:          Data Reviewed:  Basic Metabolic Panel: Recent Labs  Lab 08/04/24 0729 08/07/24 0220 08/08/24 0936  NA 130* 132* 132*  K 5.0 4.5 4.6  CL 88* 91* 91*  CO2 27 31 26   GLUCOSE 112* 106* 100*  BUN 90* 33* 57*  CREATININE 5.44* 2.78* 4.70*  CALCIUM  10.2 9.6  9.9 10.1  PHOS 4.8* 4.3 5.9*    CBC: Recent Labs  Lab 08/04/24 0729 08/07/24 0220 08/08/24 0935  WBC 5.0 3.8* 3.5*  NEUTROABS  --   --  2.3  HGB 8.6* 9.1* 8.6*  HCT 26.9* 29.6* 27.8*  MCV 99.6 101.4* 102.6*  PLT 146* 146* 131*    LFT Recent Labs  Lab 08/04/24 0729 08/07/24 0220 08/08/24 0936  ALBUMIN  2.7* 3.0* 2.9*     Antibiotics: Anti-infectives (From admission, onward)    Start     Dose/Rate Route Frequency Ordered Stop   07/08/24 1530  fluconazole  (DIFLUCAN ) tablet 150 mg        150 mg Oral  Once 07/08/24 1437 07/08/24 1544   06/18/24 2030  ceFAZolin  (ANCEF ) IVPB 2g/100 mL premix  Status:  Discontinued        2 g 200 mL/hr over 30 Minutes Intravenous Every 8 hours 06/18/24 1454 06/19/24 1608   06/18/24 0600  ceFAZolin  (ANCEF ) IVPB 2g/100 mL premix        2 g 200 mL/hr over 30  Minutes Intravenous On call to O.R. 06/15/24 1152 06/18/24 1230   06/06/24 1400  piperacillin -tazobactam (ZOSYN ) IVPB 2.25 g        2.25 g 100 mL/hr over 30 Minutes Intravenous Every 8 hours 06/06/24 1017 06/09/24 0538   06/06/24 1200  vancomycin  (VANCOREADY) IVPB 500 mg/100 mL        500 mg 100 mL/hr over 60 Minutes Intravenous Every M-W-F (Hemodialysis) 06/04/24 1745 06/06/24 1900   06/04/24 1845  vancomycin  (VANCOCIN ) IVPB 1000 mg/200 mL premix        1,000 mg 200 mL/hr over 60 Minutes Intravenous  Once 06/04/24 1745 06/04/24 1915   06/04/24 1830  ceFEPIme  (MAXIPIME ) 2 g in sodium chloride  0.9 % 100 mL IVPB  Status:  Discontinued  2 g 200 mL/hr over 30 Minutes Intravenous Every M-W-F (Hemodialysis) 06/04/24 1743 06/06/24 1017   05/27/24 1015  cefTRIAXone  (ROCEPHIN ) 2 g in sodium chloride  0.9 % 100 mL IVPB        2 g 200 mL/hr over 30 Minutes Intravenous Every 24 hours 05/27/24 0918 05/29/24 1014   05/23/24 1530  ceFEPIme  (MAXIPIME ) 1 g in sodium chloride  0.9 % 100 mL IVPB  Status:  Discontinued        1 g 200 mL/hr over 30 Minutes Intravenous Every 24 hours 05/23/24 1433 05/27/24 0918   05/22/24 2200  Ampicillin -Sulbactam (UNASYN ) 3 g in sodium chloride  0.9 % 100 mL IVPB  Status:  Discontinued        3 g 200 mL/hr over 30 Minutes Intravenous Every 24 hours 05/22/24 0834 05/23/24 1433   05/22/24 0930  Ampicillin -Sulbactam (UNASYN ) 3 g in sodium chloride  0.9 % 100 mL IVPB        3 g 200 mL/hr over 30 Minutes Intravenous  Once 05/22/24 0834 05/22/24 1900        CONSULTS neurosurgery, palliative care  Code Status: Full code  Family Communication:      Subjective   No complaints today Mother at bedside  Objective    Physical Examination:  General: No acute distress. Cardiovascular: RRR Lungs: unlabored Abdomen: Soft, nontender, nondistended - peg tube Neurological: Alert and disoriented. Moves all extremities 4. Cranial nerves II through XII grossly  intact. Extremities: No clubbing or cyanosis. No edema.   [name] [name]   [name] If 7PM-7AM, please contact night-coverage at [email or web address], Office  [phone]   08/10/2024, 4:36 PM  LOS: 83 days

## 2024-08-11 ENCOUNTER — Encounter (HOSPITAL_COMMUNITY): Payer: Self-pay

## 2024-08-11 LAB — CBC WITH DIFFERENTIAL/PLATELET
Abs Immature Granulocytes: 0.02 K/uL (ref 0.00–0.07)
Basophils Absolute: 0 K/uL (ref 0.0–0.1)
Basophils Relative: 0 %
Eosinophils Absolute: 0 K/uL (ref 0.0–0.5)
Eosinophils Relative: 1 %
HCT: 26.9 % — ABNORMAL LOW (ref 36.0–46.0)
Hemoglobin: 8.7 g/dL — ABNORMAL LOW (ref 12.0–15.0)
Immature Granulocytes: 1 %
Lymphocytes Relative: 30 %
Lymphs Abs: 0.9 K/uL (ref 0.7–4.0)
MCH: 32.2 pg (ref 26.0–34.0)
MCHC: 32.3 g/dL (ref 30.0–36.0)
MCV: 99.6 fL (ref 80.0–100.0)
Monocytes Absolute: 0.2 K/uL (ref 0.1–1.0)
Monocytes Relative: 8 %
Neutro Abs: 1.9 K/uL (ref 1.7–7.7)
Neutrophils Relative %: 60 %
Platelets: 119 K/uL — ABNORMAL LOW (ref 150–400)
RBC: 2.7 MIL/uL — ABNORMAL LOW (ref 3.87–5.11)
RDW: 14.7 % (ref 11.5–15.5)
WBC: 3.1 K/uL — ABNORMAL LOW (ref 4.0–10.5)
nRBC: 0 % (ref 0.0–0.2)

## 2024-08-11 LAB — PHOSPHORUS: Phosphorus: 6.1 mg/dL — ABNORMAL HIGH (ref 2.5–4.6)

## 2024-08-11 LAB — COMPREHENSIVE METABOLIC PANEL WITH GFR
ALT: 11 U/L (ref 0–44)
AST: 10 U/L — ABNORMAL LOW (ref 15–41)
Albumin: 3 g/dL — ABNORMAL LOW (ref 3.5–5.0)
Alkaline Phosphatase: 73 U/L (ref 38–126)
Anion gap: 14 (ref 5–15)
BUN: 55 mg/dL — ABNORMAL HIGH (ref 6–20)
CO2: 27 mmol/L (ref 22–32)
Calcium: 9.9 mg/dL (ref 8.9–10.3)
Chloride: 96 mmol/L — ABNORMAL LOW (ref 98–111)
Creatinine, Ser: 5.85 mg/dL — ABNORMAL HIGH (ref 0.44–1.00)
GFR, Estimated: 9 mL/min — ABNORMAL LOW (ref >60)
Glucose, Bld: 86 mg/dL (ref 70–99)
Potassium: 4.7 mmol/L (ref 3.5–5.1)
Sodium: 137 mmol/L (ref 135–145)
Total Bilirubin: 0.7 mg/dL (ref 0.0–1.2)
Total Protein: 5.8 g/dL — ABNORMAL LOW (ref 6.5–8.1)

## 2024-08-11 LAB — MAGNESIUM: Magnesium: 2.9 mg/dL — ABNORMAL HIGH (ref 1.7–2.4)

## 2024-08-11 MED ORDER — RENA-VITE PO TABS
1.0000 | ORAL_TABLET | Freq: Every day | ORAL | Status: DC
  Administered 2024-08-11 – 2024-08-26 (×16): 1 via ORAL
  Filled 2024-08-11 (×16): qty 1

## 2024-08-11 NOTE — Plan of Care (Signed)
  Problem: Education: Goal: Knowledge of disease or condition will improve Outcome: Progressing Goal: Knowledge of secondary prevention will improve (MUST DOCUMENT ALL) Outcome: Progressing Goal: Knowledge of patient specific risk factors will improve (DELETE if not current risk factor) Outcome: Progressing   Problem: Self-Care: Goal: Ability to participate in self-care as condition permits will improve Outcome: Progressing   Problem: Education: Goal: Knowledge of General Education information will improve Description: Including pain rating scale, medication(s)/side effects and non-pharmacologic comfort measures Outcome: Progressing   Problem: Health Behavior/Discharge Planning: Goal: Ability to manage health-related needs will improve Outcome: Progressing   Problem: Clinical Measurements: Goal: Ability to maintain clinical measurements within normal limits will improve Outcome: Progressing Goal: Will remain free from infection Outcome: Progressing Goal: Diagnostic test results will improve Outcome: Progressing Goal: Respiratory complications will improve Outcome: Progressing Goal: Cardiovascular complication will be avoided Outcome: Progressing   Problem: Activity: Goal: Risk for activity intolerance will decrease Outcome: Progressing   Problem: Nutrition: Goal: Adequate nutrition will be maintained Outcome: Progressing   Problem: Safety: Goal: Ability to remain free from injury will improve Outcome: Progressing   Problem: Skin Integrity: Goal: Risk for impaired skin integrity will decrease Outcome: Progressing

## 2024-08-11 NOTE — Progress Notes (Signed)
 [facility] Progress Note   Subjective:   Seen in room with mother at bedside. TF remain off. Mom reports a good appetite, eating well. Pt asleep but rouses briefly. Denies cp, sob.   Objective Vitals:   08/11/24 0000 08/11/24 0400 08/11/24 0500 08/11/24 0738  BP: (!) 158/72  (!) 160/68 (!) 168/72  Pulse: 82  73 78  Resp: 15 14 15 15   Temp: 98.2 F (36.8 C)  98.2 F (36.8 C) 98.2 F (36.8 C)  TempSrc: Oral  Oral Oral  SpO2: 98%  99% 99%  Weight:      Height:       Physical Exam General: alert female in NAD Heart: RRR Lungs: CTAB, nml WOB on RA Abdomen: soft, NTND Extremities: no LE edema Dialysis Access: LU AVF   Filed Weights   08/08/24 0500 08/08/24 0830 08/08/24 1242  Weight: 54.3 kg 54.3 kg 52.9 kg    Intake/Output Summary (Last 24 hours) at 08/11/2024 0946 Last data filed at 08/10/2024 2200 Gross per 24 hour  Intake 870 ml  Output 0 ml  Net 870 ml    Additional Objective Labs: Basic Metabolic Panel: Recent Labs  Lab 08/07/24 0220 08/08/24 0936 08/11/24 0344  NA 132* 132* 137  K 4.5 4.6 4.7  CL 91* 91* 96*  CO2 31 26 27   GLUCOSE 106* 100* 86  BUN 33* 57* 55*  CREATININE 2.78* 4.70* 5.85*  CALCIUM  9.6  9.9 10.1 9.9  PHOS 4.3 5.9* 6.1*   Liver Function Tests: Recent Labs  Lab 08/07/24 0220 08/08/24 0936 08/11/24 0344  AST  --   --  10*  ALT  --   --  11  ALKPHOS  --   --  73  BILITOT  --   --  0.7  PROT  --   --  5.8*  ALBUMIN  3.0* 2.9* 3.0*   CBC: Recent Labs  Lab 08/07/24 0220 08/08/24 0935 08/11/24 0344  WBC 3.8* 3.5* 3.1*  NEUTROABS  --  2.3 1.9  HGB 9.1* 8.6* 8.7*  HCT 29.6* 27.8* 26.9*  MCV 101.4* 102.6* 99.6  PLT 146* 131* 119*   Blood Culture    Component Value Date/Time   SDES CSF 06/11/2024 0920   SPECREQUEST LP 06/11/2024 0920   CULT  06/11/2024 0920    NO GROWTH 3 DAYS Performed at [facility], [address] [place], [name] [address]    REPTSTATUS 06/14/2024 FINAL 06/11/2024 0920    CBG: Recent Labs  Lab 08/09/24 2028 08/10/24 0311 08/10/24 0827 08/10/24 1148 08/10/24 1753  GLUCAP 114* 85 101* 161* 124*   Iron  Studies:  Recent Labs    08/09/24 1036  IRON  81  TIBC 259  FERRITIN 615*   Lab Results  Component Value Date   INR 1.1 06/18/2024   INR 1.1 06/16/2024   INR 1.1 05/20/2024   Studies/Results: No results found.  Medications:  feeding supplement (NEPRO CARB STEADY) Stopped (08/08/24 0636)    amLODipine   10 mg Oral Daily   carvedilol   25 mg Oral BID WC   cinacalcet   30 mg Oral Q M,W,F   cycloSPORINE   200 mg Oral QPM   cycloSPORINE   225 mg Oral Daily   darbepoetin (ARANESP ) injection - DIALYSIS  200 mcg Subcutaneous Q Thu-1800   famotidine   10 mg Oral QHS   fiber supplement (BANATROL TF)  60 mL Per Tube BID   gabapentin   100 mg Oral Daily   hydrALAZINE   25 mg Oral Q8H  HYDROmorphone  (DILAUDID ) injection  0.25 mg Intravenous Once   lacosamide   100 mg Oral BID   losartan   25 mg Oral Q1200   multivitamin with minerals  1 tablet Oral Daily   mycophenolate   500 mg Oral BID   pantoprazole   40 mg Oral Daily   predniSONE   15 mg Oral Q breakfast    Dialysis Orders: 3:45hr, 400/A1.5, EDW 55.2kg, 2K/2Ca bath, AVF, no heparin  - Prev on Mircera 150mcg IV q 2 weeks - No VDRA   Assessment/Plan: Acute R intracranial hemorrhage: 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 Needs ongoing rehab. Doing a lot better.  ESRD:  HD on MWF.  Next HD on 08/11/24.  HTN/volume: BP somewhat labile - elevated again today.  Appearing euvolemic. Cont current meds - carvedilol  25mg  BID, hydralazine  25mg  TID, losartan  25mg  every day.  Amlodipine  was titrated to 10 today.  UF with HD  Anemia of ESRD: Hgb 8s > 9s, transfuse prn. Continue Aranesp  200 q Thurs. 12/6 iron  sat 31%.  Secondary HPTH: CorrCa high  likely due to immobility, phos elevated today. Not on a binder.  If remains elevated will need to add one.  Continue sensipar , not on VDRA. Pth 126 on 08/07/24.   Nutrition: Alb low, on nepro TF at night but this was held overnight due feeling full + on another calorie count.  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 Dispo: Insurance declined LTAC, and SNF, appeal pending. Tolerated HD in recliner on multiple days  Jearl Soto Ronnald Acosta PA-C [facility] 08/11/2024,9:46 AM

## 2024-08-11 NOTE — Progress Notes (Signed)
 Nutrition Follow-up / Calorie Count  DOCUMENTATION CODES:   Non-severe (moderate) malnutrition in context of chronic illness  INTERVENTION:   D/C Calorie count. D/C nocturnal tube feedings. Continue Regular diet, encourage intake of meals. Continue MVI daily. Continue banatrol TID.  NUTRITION DIAGNOSIS:   Moderate Malnutrition related to chronic illness (ESRD on HD/DM and gastroparesis) as evidenced by severe muscle depletion, mild fat depletion; ongoing.  GOAL:   Patient will meet greater than or equal to 90% of their needs; met with intake of meals.  MONITOR:   PO intake, Weight trends, TF tolerance, I & O's, Labs  REASON FOR ASSESSMENT:   Consult Calorie Count  ASSESSMENT:   43 year old female who presented as a Code Stroke with IVH. 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 Recent admissions 6/11-7/28 and 7/29-8/13 for ICH with GJ tube.  Patient is on a regular diet. Meal intakes: 50-100% Nocturnal TF via PEG: Nepro at 45 ml/h x 12 h.  Calorie Count, Sunday December 7: Breakfast: 617 kcal, 28 gm protein Lunch: 845 kcal, 51 gm protein Dinner: 420 kcal, 25 gm protein Total intake: 1882 kcal, 104 gm protein This meets 100% of estimated nutrition needs.  Patient has been refusing nocturnal tube feeds and says she is eating great. Okay to discontinue, since intake is so good. Discussed with MD.   Labs reviewed.  Phos 6.1 CBG: [phone] (12/7)  Medications reviewed and include sensipar  M-W-F, aranesp  weekly, pepcid , banatrol TF, MVI with minerals, protonix , prednisone .  Diet Order:   Diet Order             Diet regular Room service appropriate? Yes with Assist; Fluid consistency: Thin  Diet effective now                   EDUCATION NEEDS:   Education needs have been  addressed  Skin:  Skin Assessment: Skin Integrity Issues: Skin Integrity Issues:: Incisions, Other (Comment) Stage I: buttocks Other: contact dermatitis buttocks  Last BM:  12/7  Height:   Ht Readings from Last 1 Encounters:  06/18/24 5' 3 (1.6 m)    Weight:   Wt Readings from Last 1 Encounters:  08/08/24 52.9 kg    Ideal Body Weight:  52.3 kg  BMI:  Body mass index is 20.66 kg/m.  Estimated Nutritional Needs:   Kcal:  1700-1900  Protein:  80-100 grams  Fluid:  1L +UOP   Suzen HUNT RD, LDN, CNSC Contact via secure chat. If unavailable, use group chat RD Inpatient.

## 2024-08-11 NOTE — Progress Notes (Signed)
 Physical Therapy Treatment Patient Details Name: Lorrie Gargan MRN: [id number] DOB: [birth year] Today's Date: 08/11/2024   History of Present Illness 43 yo female presents 05/19/24 for left gaze and L side weakness. 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 Intubated 9/15-9/23. EVD 9/15- 10/15; s/p shunt 10/15. S/p laparoscopy with intra-abdominal assistance ventricular-peritoneal shunt placement 10/15. PMH: HTN, ESRD on HD s/p renal and pancreatic transplants, DM, gastroparesis, multiple strokes (ischemic and hemorrhagic) with prepontine SAH, hydrocephalus requiring EVD placement    PT Comments  Pt tolerated treatment well today. Pt today was able to progress ambulation in the hallway with RW CGA to the nurses station. 2 seated rest breaks and cues for proximity to RW. Mother present, encouraging, and provided chair follow. No change in DC/DME recs at this time. PT will continue to follow.     If plan is discharge home, recommend the following: A lot of help with walking and/or transfers;A lot of help with bathing/dressing/bathroom;Assistance with cooking/housework;Direct supervision/assist for medications management;Direct supervision/assist for financial management;Assist for transportation;Help with stairs or ramp for entrance   Can travel by private vehicle     No  Equipment Recommendations  Hospital bed;Hoyer lift;Wheelchair (measurements PT);Wheelchair cushion (measurements PT);BSC/3in1    Recommendations for Other Services       Precautions / Restrictions Precautions Precautions: Fall Recall of Precautions/Restrictions: Impaired Precaution/Restrictions Comments: PEG Restrictions Weight Bearing Restrictions Per Provider Order: No     Mobility  Bed Mobility Overal bed mobility: Needs Assistance Bed Mobility: Supine to Sit     Supine to sit: Contact  guard     General bed mobility comments: CGA for safety.    Transfers Overall transfer level: Needs assistance Equipment used: Rolling walker (2 wheels) Transfers: Sit to/from Stand Sit to Stand: Contact guard assist, Min assist           General transfer comment: Stood from bed height with VC for hand placement and powering up to RW.    Ambulation/Gait Ambulation/Gait assistance: +2 safety/equipment, Contact guard assist Gait Distance (Feet): 150 Feet (63ftx3) Assistive device: Rolling walker (2 wheels) Gait Pattern/deviations: Step-through pattern, Decreased stride length, Trunk flexed, Shuffle Gait velocity: Dec     General Gait Details: Pt today was able to progress ambulation in the hallway with RW CGA to the nurses station. 2 seated rest breaks and cues for proximity to RW. Mother present, encouraging, and provided chair follow.   Stairs             Wheelchair Mobility     Tilt Bed    Modified Rankin (Stroke Patients Only) Modified Rankin (Stroke Patients Only) Pre-Morbid Rankin Score: Moderately severe disability Modified Rankin: Moderately severe disability     Balance Overall balance assessment: Needs assistance Sitting-balance support: No upper extremity supported, Feet supported Sitting balance-Leahy Scale: Good Sitting balance - Comments: seated EOB, no LOB observed   Standing balance support: Bilateral upper extremity supported, During functional activity, Reliant on assistive device for balance Standing balance-Leahy Scale: Poor Standing balance comment: reliant on RW support                            Communication Communication Communication: No apparent difficulties  Cognition Arousal: Alert Behavior During Therapy: WFL for tasks assessed/performed   PT - Cognitive impairments: Initiation, Safety/Judgement, Problem solving, Sequencing  PT - Cognition Comments: Pt demonstrating difficulty  intiating movement during bed mobility, and slowed response throughout session. Pt states repeatedly that she is very tired and that she does not want to mobilize, but was willing to work with therapy after max encouragement from PT and mother. Following commands: Impaired Following commands impaired: Follows one step commands with increased time, Follows multi-step commands inconsistently    Cueing Cueing Techniques: Verbal cues, Gestural cues  Exercises      General Comments General comments (skin integrity, edema, etc.): VSS      Pertinent Vitals/Pain Pain Assessment Pain Assessment: No/denies pain    Home Living                          Prior Function            PT Goals (current goals can now be found in the care plan section) Progress towards PT goals: Progressing toward goals    Frequency    Min 2X/week      PT Plan      Co-evaluation              AM-PAC PT 6 Clicks Mobility   Outcome Measure  Help needed turning from your back to your side while in a flat bed without using bedrails?: A Little Help needed moving from lying on your back to sitting on the side of a flat bed without using bedrails?: A Lot Help needed moving to and from a bed to a chair (including a wheelchair)?: A Little Help needed standing up from a chair using your arms (e.g., wheelchair or bedside chair)?: A Little Help needed to walk in hospital room?: A Little Help needed climbing 3-5 steps with a railing? : A Lot 6 Click Score: 16    End of Session Equipment Utilized During Treatment: Gait belt Activity Tolerance: Patient tolerated treatment well Patient left: in chair;with call bell/phone within reach;with chair alarm set;with family/visitor present Nurse Communication: Mobility status PT Visit Diagnosis: Unsteadiness on feet (R26.81);Other abnormalities of gait and mobility (R26.89);Muscle weakness (generalized) (M62.81);Difficulty in walking, not elsewhere  classified (R26.2);Other symptoms and signs involving the nervous system (R29.898)     Time: 9056-8987 PT Time Calculation (min) (ACUTE ONLY): 29 min  Charges:    $Gait Training: 8-22 mins $Therapeutic Activity: 8-22 mins PT General Charges $$ ACUTE PT VISIT: 1 Visit                     Lecil Tapp B, PT, DPT Acute Rehab Services [phone]    Kamla Skilton 08/11/2024, 3:42 PM

## 2024-08-11 NOTE — TOC Progression Note (Signed)
 Transition of Care [facility]) - Progression Note    Patient Details  Name: [name] [name] MRN: [id number] Date of Birth: 1981-01-18  Transition of Care [facility]) CM/SW Contact  [name], [name] Phone Number: 08/11/2024, 3:42 PM  Clinical Narrative:   CSW attempting to locate SNF for patient where the mother could be with the patient. CSW contacted [facility] for [facility] to ask about availability. [facility] would consider if they had a private room, but they don't have a private room at this time. [facility] for [facility] does not allow family to stay overnight at any facility. [facility] said it would be a possibility, and CSW asked about sending referral.   CSW contacted inpatient care management leadership to discuss, suggested reaching out to pastoral care about [facility] for the mother as a possibility that would be closer on the bus route. Also suggested contacting [facility] for [facility] to see if there were other buildings that might be able to take the patient. CSW spoke with regional for [facility], and [facility] has a visit from the state tomorrow that might clear them for taking admissions again. [facility] to also look and see if there are other buildings that might be able to offer, will provide update to CSW tomorrow. CSW to follow.    Expected Discharge Plan: Skilled Nursing Facility Barriers to Discharge: No SNF bed, Insurance Authorization               Expected Discharge Plan and Services In-house Referral: Clinical Social Work   Post Acute Care Choice: Skilled Nursing Facility Living arrangements for the past 2 months: Apartment                                       Social Drivers of Health (SDOH) Interventions SDOH Screenings   Food Insecurity: Patient Unable To Answer (05/20/2024)  Housing: Unknown (07/14/2024)  Transportation Needs: Patient Unable To Answer (05/20/2024)  Utilities: Patient Unable To Answer (05/20/2024)  Social Connections: Unknown  (10/04/2022)   Received from [facility]  Stress: No Stress Concern Present (04/16/2024)   Received from [facility]  Tobacco Use: Medium Risk (06/18/2024)    Readmission Risk Interventions    05/20/2024    4:16 PM 03/31/2024   11:27 AM 11/17/2021   12:15 PM  Readmission Risk Prevention Plan  Transportation Screening Complete Complete Complete  Medication Review Oceanographer) Complete Complete Complete  PCP or Specialist appointment within 3-5 days of discharge Complete  Complete  HRI or Home Care Consult Complete Complete Complete  SW Recovery Care/Counseling Consult Complete Complete Complete  Palliative Care Screening Complete Not Applicable Not Applicable  Skilled Nursing Facility Complete Not Applicable Patient Refused

## 2024-08-11 NOTE — Progress Notes (Addendum)
 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  Patient was noted to have hemorrhagic stroke.  Neurology, neurosurgery in critical care were consulted.  EVD was placed on the left side and was initially admitted to neuro ICU.  She was also intubated and had prolonged intubation.  Subsequently, patient was extubated on 05/27/2024.   Important events:  9/15 admitted with ICH plus IVH, EVD was placed for hydrocephalus, intubated 9/22 palliative care meeting.  Family is insistent that they want full scope of care 9/23 Extubated 10/1 -developed fevers pancultures drawn and broad-spectrum antibiotics started 10/2 became afebrile after starting antibiotics, did not tolerate raising EVD 10/6 EVD was raised from 10 to 30 cm water , tolerating well, remained afebrile 10/7 EVD is at 30 cm water , CT scan will be done tomorrow morning, no overnight issues 10/8 she became somnolent, CT head showed increasing hydrocephalus, EVD was titrated down to 10 cm of water  with improvement in mental status 10/15 VP shunt placement 10/17 transferred to progressive unit with [name]  11/18-medically stable.  Awaiting for disposition.  Insurance declined LTAC and skilled nursing facility, appeal pending. 12/3-tube feeding changed to nocturnal tube feeds, started on p.o. diet    Assessment/Plan:   Acute right parietotemporal intraparenchymal hemorrhage / Intraventricular hemorrhage: Uncontrolled hypertension: Obstructive hydrocephalus s/p EVD changed to VP shunt: Seizure disorder S/p VP shunt placement on 06/18/2024  by Dr. Mavis.  Continue Vimpat .   Neurosurgery intermittently followed the patient during hospitalization.   Left forehead stitch, will d/c today   ESRD on dialysis: History of failed renal transplant: Secondary hyperparathyroidism: Nephrology on board.  Continue CellCept  and prednisone .   On Monday, Wednesday, Friday hemodialysis schedule.   TOC is working on finding outpatient hemodialysis set up  Hyponatremia trend  History of pancreatic/kidney transplantation:  Continue cyclosporine , mycophenolate , and prednisone  (15 mg seems like [name] [name] high dose, will discuss with renal).   Hypertension:  -Blood pressure is mildly elevated -Continue Coreg  25 mg p.o. twice daily -amlodipine  10 mg   Dizziness - Unclear etiology -Symptoms mainly on standing from sitting position - will follow symptoms with RN (no complaints of dizziness today) -follow orthostatics  Anemia of chronic disease:  Latest hemoglobin  8.5.. Transfuse if Hb below 7.0   Type 1 diabetes with vasculopathy/gastroparesis:  Not on long-term insulin  anymore due to pancreatic transplant.  A1c 3.9 Follow BG intermittently on BMP    Moderate protein calorie malnutrition/dysphagia:  S/p PEG She's taking PO well, will d/c nightly tube feeds   Debility/deconditioning/disposition/goals of care:  Palliative care had seen the patient during hospitalization.  Palliative care has signed off 10/17.  Disposition pending.  TOC on board.    DVT prophylaxis: SCDs  Medications     amLODipine   10 mg Oral Daily   carvedilol   25 mg Oral BID WC   cinacalcet   30 mg Oral Q M,W,F   cycloSPORINE   200 mg Oral QPM   cycloSPORINE   225 mg Oral Daily   darbepoetin (ARANESP ) injection - DIALYSIS  200 mcg Subcutaneous Q Thu-1800   famotidine   10 mg Oral QHS   fiber supplement (BANATROL TF)  60 mL Per Tube BID  gabapentin   100 mg Oral Daily   hydrALAZINE   25 mg Oral Q8H    HYDROmorphone  (DILAUDID ) injection  0.25 mg Intravenous Once    lacosamide   100 mg Oral BID   losartan   25 mg Oral Q1200   multivitamin  1 tablet Oral QHS   mycophenolate   500 mg Oral BID   pantoprazole   40 mg Oral Daily   predniSONE   15 mg Oral Q breakfast     Data Reviewed:   CBG:  Recent Labs  Lab 08/09/24 2028 08/10/24 0311 08/10/24 0827 08/10/24 1148 08/10/24 1753  GLUCAP 114* 85 101* 161* 124*    SpO2: 99 % O2 Flow Rate (L/min): 100 L/min FiO2 (%): 40 %    Vitals:   08/11/24 0400 08/11/24 0500 08/11/24 0738 08/11/24 0738  BP:  (!) 160/68 (!) 168/72 (!) 168/72  Pulse:  73 78 78  Resp: 14 15 15    Temp:  98.2 F (36.8 C) 98.2 F (36.8 C) 98.2 F (36.8 C)  TempSrc:  Oral Oral Oral  SpO2:  99% 99% 99%  Weight:      Height:          Data Reviewed:  Basic Metabolic Panel: Recent Labs  Lab 08/07/24 0220 08/08/24 0936 08/11/24 0344  NA 132* 132* 137  K 4.5 4.6 4.7  CL 91* 91* 96*  CO2 31 26 27   GLUCOSE 106* 100* 86  BUN 33* 57* 55*  CREATININE 2.78* 4.70* 5.85*  CALCIUM  9.6  9.9 10.1 9.9  MG  --   --  2.9*  PHOS 4.3 5.9* 6.1*    CBC: Recent Labs  Lab 08/07/24 0220 08/08/24 0935 08/11/24 0344  WBC 3.8* 3.5* 3.1*  NEUTROABS  --  2.3 1.9  HGB 9.1* 8.6* 8.7*  HCT 29.6* 27.8* 26.9*  MCV 101.4* 102.6* 99.6  PLT 146* 131* 119*    LFT Recent Labs  Lab 08/07/24 0220 08/08/24 0936 08/11/24 0344  AST  --   --  10*  ALT  --   --  11  ALKPHOS  --   --  73  BILITOT  --   --  0.7  PROT  --   --  5.8*  ALBUMIN  3.0* 2.9* 3.0*     Antibiotics: Anti-infectives (From admission, onward)    Start     Dose/Rate Route Frequency Ordered Stop   07/08/24 1530  fluconazole  (DIFLUCAN ) tablet 150 mg        150 mg Oral  Once 07/08/24 1437 07/08/24 1544   06/18/24 2030  ceFAZolin  (ANCEF ) IVPB 2g/100 mL premix  Status:  Discontinued        2 g 200 mL/hr over 30 Minutes Intravenous Every 8 hours 06/18/24 1454 06/19/24 1608   06/18/24 0600  ceFAZolin  (ANCEF ) IVPB 2g/100 mL premix        2 g 200 mL/hr over 30  Minutes Intravenous On call to O.R. 06/15/24 1152 06/18/24 1230   06/06/24 1400  piperacillin -tazobactam (ZOSYN ) IVPB 2.25 g        2.25 g 100 mL/hr over 30 Minutes Intravenous Every 8 hours 06/06/24 1017 06/09/24 0538   06/06/24 1200  vancomycin  (VANCOREADY) IVPB 500 mg/100 mL        500 mg 100 mL/hr over 60 Minutes Intravenous Every M-W-F (Hemodialysis) 06/04/24 1745 06/06/24 1900   06/04/24 1845  vancomycin  (VANCOCIN ) IVPB 1000 mg/200 mL premix        1,000 mg 200 mL/hr over 60 Minutes Intravenous  Once 06/04/24 1745 06/04/24  1915   06/04/24 1830  ceFEPIme  (MAXIPIME ) 2 g in sodium chloride  0.9 % 100 mL IVPB  Status:  Discontinued        2 g 200 mL/hr over 30 Minutes Intravenous Every M-W-F (Hemodialysis) 06/04/24 1743 06/06/24 1017   05/27/24 1015  cefTRIAXone  (ROCEPHIN ) 2 g in sodium chloride  0.9 % 100 mL IVPB        2 g 200 mL/hr over 30 Minutes Intravenous Every 24 hours 05/27/24 0918 05/29/24 1014   05/23/24 1530  ceFEPIme  (MAXIPIME ) 1 g in sodium chloride  0.9 % 100 mL IVPB  Status:  Discontinued        1 g 200 mL/hr over 30 Minutes Intravenous Every 24 hours 05/23/24 1433 05/27/24 0918   05/22/24 2200  Ampicillin -Sulbactam (UNASYN ) 3 g in sodium chloride  0.9 % 100 mL IVPB  Status:  Discontinued        3 g 200 mL/hr over 30 Minutes Intravenous Every 24 hours 05/22/24 0834 05/23/24 1433   05/22/24 0930  Ampicillin -Sulbactam (UNASYN ) 3 g in sodium chloride  0.9 % 100 mL IVPB        3 g 200 mL/hr over 30 Minutes Intravenous  Once 05/22/24 0834 05/22/24 1900        CONSULTS neurosurgery, palliative care  Code Status: Full code  Family Communication:      Subjective   Denies complaints Is not enjoying watching the snow Ate 100% of her meals yesterday  Objective    Physical Examination:  General: No acute distress. Sitting in chair, watching snow.  Cardiovascular: RRR Lungs: unlabored Neurological: Alert. Moves all extremities 4. Cranial nerves II through XII  grossly intact. Extremities: No clubbing or cyanosis. No edema.   Meliton Monte   [name] If 7PM-7AM, please contact night-coverage at [email or web address], Office  [phone]   08/11/2024, 1:02 PM  LOS: 84 days

## 2024-08-12 LAB — RENAL FUNCTION PANEL
Albumin: 3.1 g/dL — ABNORMAL LOW (ref 3.5–5.0)
Anion gap: 16 — ABNORMAL HIGH (ref 5–15)
BUN: 77 mg/dL — ABNORMAL HIGH (ref 6–20)
CO2: 23 mmol/L (ref 22–32)
Calcium: 9.9 mg/dL (ref 8.9–10.3)
Chloride: 96 mmol/L — ABNORMAL LOW (ref 98–111)
Creatinine, Ser: 7.18 mg/dL — ABNORMAL HIGH (ref 0.44–1.00)
GFR, Estimated: 7 mL/min — ABNORMAL LOW (ref >60)
Glucose, Bld: 151 mg/dL — ABNORMAL HIGH (ref 70–99)
Phosphorus: 6.4 mg/dL — ABNORMAL HIGH (ref 2.5–4.6)
Potassium: 5.3 mmol/L — ABNORMAL HIGH (ref 3.5–5.1)
Sodium: 135 mmol/L (ref 135–145)

## 2024-08-12 LAB — CBC
HCT: 28.8 % — ABNORMAL LOW (ref 36.0–46.0)
Hemoglobin: 8.8 g/dL — ABNORMAL LOW (ref 12.0–15.0)
MCH: 31.9 pg (ref 26.0–34.0)
MCHC: 30.6 g/dL (ref 30.0–36.0)
MCV: 104.3 fL — ABNORMAL HIGH (ref 80.0–100.0)
Platelets: 141 K/uL — ABNORMAL LOW (ref 150–400)
RBC: 2.76 MIL/uL — ABNORMAL LOW (ref 3.87–5.11)
RDW: 14.6 % (ref 11.5–15.5)
WBC: 4.8 K/uL (ref 4.0–10.5)
nRBC: 0 % (ref 0.0–0.2)

## 2024-08-12 LAB — GLUCOSE, CAPILLARY: Glucose-Capillary: 143 mg/dL — ABNORMAL HIGH (ref 70–99)

## 2024-08-12 MED ORDER — DIPHENHYDRAMINE HCL 25 MG PO CAPS
ORAL_CAPSULE | ORAL | Status: AC
  Filled 2024-08-12: qty 1

## 2024-08-12 MED ORDER — ACETAMINOPHEN 325 MG PO TABS
ORAL_TABLET | ORAL | Status: AC
  Filled 2024-08-12: qty 2

## 2024-08-12 MED ORDER — ONDANSETRON HCL 4 MG/2ML IJ SOLN
INTRAMUSCULAR | Status: AC
  Filled 2024-08-12: qty 2

## 2024-08-12 NOTE — Plan of Care (Signed)
  Problem: Education: Goal: Knowledge of disease or condition will improve Outcome: Not Progressing Goal: Knowledge of secondary prevention will improve (MUST DOCUMENT ALL) Outcome: Not Progressing Goal: Knowledge of patient specific risk factors will improve (DELETE if not current risk factor) Outcome: Not Progressing   Problem: Self-Care: Goal: Ability to participate in self-care as condition permits will improve Outcome: Not Progressing   Problem: Education: Goal: Knowledge of General Education information will improve Description: Including pain rating scale, medication(s)/side effects and non-pharmacologic comfort measures Outcome: Not Progressing   Problem: Health Behavior/Discharge Planning: Goal: Ability to manage health-related needs will improve Outcome: Not Progressing

## 2024-08-12 NOTE — Progress Notes (Signed)
 [facility] Progress Note   Subjective:   HD rollover d/t high census. Seen in dialysis unit. Attempting 2L UF goal today. Tolerating well.   Objective Vitals:   08/12/24 0400 08/12/24 0835 08/12/24 0842 08/12/24 0900  BP: (!) 162/72 (!) 158/70 (!) 164/71 (!) 172/71  Pulse: 74 80 80 80  Resp: 14 16 20 18   Temp: 98.2 F (36.8 C) (!) 97.4 F (36.3 C)    TempSrc: Oral     SpO2: 98% 98% 99% 100%  Weight:  57 kg    Height:       Physical Exam General: alert female in NAD Heart: RRR Lungs: CTAB, nml WOB on RA Abdomen: soft, NTND Extremities: no LE edema Dialysis Access: LU AVF   Filed Weights   08/08/24 0830 08/08/24 1242 08/12/24 0835  Weight: 54.3 kg 52.9 kg 57 kg    Intake/Output Summary (Last 24 hours) at 08/12/2024 0918 Last data filed at 08/11/2024 2230 Gross per 24 hour  Intake 850 ml  Output 0 ml  Net 850 ml    Additional Objective Labs: Basic Metabolic Panel: Recent Labs  Lab 08/07/24 0220 08/08/24 0936 08/11/24 0344  NA 132* 132* 137  K 4.5 4.6 4.7  CL 91* 91* 96*  CO2 31 26 27   GLUCOSE 106* 100* 86  BUN 33* 57* 55*  CREATININE 2.78* 4.70* 5.85*  CALCIUM  9.6  9.9 10.1 9.9  PHOS 4.3 5.9* 6.1*   Liver Function Tests: Recent Labs  Lab 08/07/24 0220 08/08/24 0936 08/11/24 0344  AST  --   --  10*  ALT  --   --  11  ALKPHOS  --   --  73  BILITOT  --   --  0.7  PROT  --   --  5.8*  ALBUMIN  3.0* 2.9* 3.0*   CBC: Recent Labs  Lab 08/07/24 0220 08/08/24 0935 08/11/24 0344  WBC 3.8* 3.5* 3.1*  NEUTROABS  --  2.3 1.9  HGB 9.1* 8.6* 8.7*  HCT 29.6* 27.8* 26.9*  MCV 101.4* 102.6* 99.6  PLT 146* 131* 119*   Blood Culture    Component Value Date/Time   SDES CSF 06/11/2024 0920   SPECREQUEST LP 06/11/2024 0920   CULT  06/11/2024 0920    NO GROWTH 3 DAYS Performed at [facility], [address] [place], [name] [address]    REPTSTATUS 06/14/2024 FINAL 06/11/2024 0920   CBG: Recent Labs  Lab 08/09/24 2028  08/10/24 0311 08/10/24 0827 08/10/24 1148 08/10/24 1753  GLUCAP 114* 85 101* 161* 124*   Iron  Studies:  Recent Labs    08/09/24 1036  IRON  81  TIBC 259  FERRITIN 615*   Lab Results  Component Value Date   INR 1.1 06/18/2024   INR 1.1 06/16/2024   INR 1.1 05/20/2024   Studies/Results: No results found.  Medications:    amLODipine   10 mg Oral Daily   carvedilol   25 mg Oral BID WC   cinacalcet   30 mg Oral Q M,W,F   cycloSPORINE   200 mg Oral QPM   cycloSPORINE   225 mg Oral Daily   darbepoetin (ARANESP ) injection - DIALYSIS  200 mcg Subcutaneous Q Thu-1800   famotidine   10 mg Oral QHS   fiber supplement (BANATROL TF)  60 mL Per Tube BID   gabapentin   100 mg Oral Daily   hydrALAZINE   25 mg Oral Q8H    HYDROmorphone  (DILAUDID ) injection  0.25 mg Intravenous Once   lacosamide   100 mg Oral  BID   losartan   25 mg Oral Q1200   multivitamin  1 tablet Oral QHS   mycophenolate   500 mg Oral BID   pantoprazole   40 mg Oral Daily   predniSONE   15 mg Oral Q breakfast    Dialysis Orders: 3:45hr, 400/A1.5, EDW 55.2kg, 2K/2Ca bath, AVF, no heparin  - Prev on Mircera 150mcg IV q 2 weeks - No VDRA   Assessment/Plan: Acute R intracranial hemorrhage: 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 Needs ongoing rehab. Doing a lot better.  ESRD:  HD on MWF.  HD off schedule today  HTN/volume: BP somewhat labile - elevated again today.  Appearing euvolemic. Cont current meds - carvedilol  25mg  BID, hydralazine  25mg  TID, losartan  .  Amlodipine  10 mg daily. Continue titrate meds.   UF with HD  Anemia of ESRD: Hgb 8s > 9s, transfuse prn. Continue Aranesp  200 q Thurs. 12/6 iron  sat 31%.  Secondary HPTH: CorrCa high  likely due to immobility, phos elevated today. Not on a binder.  If remains elevated will need to add one.  Continue sensipar , not on VDRA. Pth 126 on 08/07/24.  Nutrition: Alb low, on nepro TF at night  Hx pancreas/kidney transplant: Remains on IS for pancreas function. Dispo: Insurance  declined LTAC, and SNF, appeal pending. Tolerated HD in recliner on multiple days  Zygmunt Mcglinn Ronnald Acosta PA-C [facility] 08/12/2024,9:18 AM

## 2024-08-12 NOTE — Progress Notes (Addendum)
 Yes [facility]  Hospitalist  PROGRESS NOTE  [name] [name] FMW:[id number] DOB: 07/19/1981 DOA: 05/19/2024 [facility]CP: [facility]   Brief HPI:    43-yrs-old female with past medical history of hypertension, CVA, type 1 diabetes, gastroparesis, kidney and pancreatic transplant 2014 with subsequent failure of renal transplant now on end-stage renal dialysis, anxiety, narcotic abuse presented to the hospital from dialysis unit on 05/19/2024 as code stroke.  Patient was noted to have hemorrhagic stroke.  Neurology, neurosurgery in critical care were consulted.  EVD was placed on the left side and was initially admitted to neuro ICU.  She was also intubated and had prolonged intubation.  Subsequently, patient was extubated on 05/27/2024.   Important events:  9/15 admitted with ICH plus IVH, EVD was placed for hydrocephalus, intubated 9/22 palliative care meeting.  Family is insistent that they want full scope of care 9/23 Extubated 10/1 -developed fevers pancultures drawn and broad-spectrum antibiotics started 10/2 became afebrile after starting antibiotics, did not tolerate raising EVD 10/6 EVD was raised from 10 to 30 cm water , tolerating well, remained afebrile 10/7 EVD is at 30 cm water , CT scan will be done tomorrow morning, no overnight issues 10/8 she became somnolent, CT head showed increasing hydrocephalus, EVD was titrated down to 10 cm of water  with improvement in mental status 10/15 VP shunt placement 10/17 transferred to progressive unit with [name]  11/18-medically stable.  Awaiting for disposition.  Insurance declined LTAC and skilled nursing facility, appeal pending. 12/3-tube feeding changed to nocturnal tube feeds, started on p.o. diet    Assessment/Plan:   Acute right parietotemporal intraparenchymal hemorrhage / Intraventricular hemorrhage: Uncontrolled hypertension: Obstructive hydrocephalus s/p EVD changed to VP shunt: Seizure disorder S/p VP shunt placement on 06/18/2024  by Dr. Mavis.  Continue Vimpat .   Neurosurgery intermittently followed the patient during hospitalization.   Left forehead stitch removed   ESRD on dialysis: History of failed renal transplant: Secondary hyperparathyroidism: Nephrology on board.  Continue CellCept  and prednisone .   On Monday, Wednesday, Friday hemodialysis schedule.   TOC is working on finding outpatient hemodialysis set up  Hyponatremia trend  History of pancreatic/kidney transplantation:  Continue cyclosporine , mycophenolate , and prednisone  (will continue 15 mg dose based on discussion with renal)   Hypertension:  -Continue Coreg  25 mg p.o. twice daily -amlodipine  10 mg   Dizziness - Unclear etiology -Symptoms mainly on standing from sitting position - will follow symptoms with RN (no complaints of dizziness today) -follow orthostatics  Anemia of chronic disease:  Latest hemoglobin  8.5.. Transfuse if Hb below 7.0   Type 1 diabetes with vasculopathy/gastroparesis:  Not on long-term insulin  anymore due to pancreatic transplant.  A1c 3.9 Follow BG intermittently on BMP    Moderate protein calorie malnutrition/dysphagia:  S/p PEG She's taking PO well, will d/c nightly tube feeds   Debility/deconditioning/disposition/goals of care:  Palliative care had seen the patient during hospitalization.  Palliative care has signed off 10/17.  Disposition pending.  TOC on board.    No new needs today.    DVT prophylaxis: SCDs  Medications     amLODipine   10 mg Oral Daily   carvedilol   25 mg Oral BID WC   cinacalcet   30 mg Oral Q M,W,F   cycloSPORINE   200 mg Oral QPM   cycloSPORINE   225 mg Oral Daily   darbepoetin (ARANESP ) injection - DIALYSIS  200 mcg Subcutaneous Q Thu-1800   famotidine   10 mg Oral QHS   fiber supplement (BANATROL TF)  60 mL Per Tube BID  gabapentin   100 mg Oral Daily   hydrALAZINE   25 mg Oral Q8H    HYDROmorphone  (DILAUDID ) injection  0.25 mg Intravenous Once   lacosamide   100 mg  Oral BID   losartan   25 mg Oral Q1200   multivitamin  1 tablet Oral QHS   mycophenolate   500 mg Oral BID   pantoprazole   40 mg Oral Daily   predniSONE   15 mg Oral Q breakfast     Data Reviewed:   CBG:  Recent Labs  Lab 08/10/24 0311 08/10/24 0827 08/10/24 1148 08/10/24 1753 08/12/24 1338  GLUCAP 85 101* 161* 124* 143*    SpO2: 99 % O2 Flow Rate (L/min): 100 L/min FiO2 (%): 40 %    Vitals:   08/12/24 1159 08/12/24 1209 08/12/24 1211 08/12/24 1331  BP: (!) 165/61 (!) 153/76 (!) (P) 153/76 (!) 157/70  Pulse: 82 84 (P) 84 82  Resp: (!) 22 (!) 23 (!) (P) 22 (!) 21  Temp: 98.2 F (36.8 C) 98.2 F (36.8 C) (P) 98.2 F (36.8 C) 98.9 F (37.2 C)  TempSrc:    Oral  SpO2: 98% 97% (P) 98% 99%  Weight: 56.4 kg  (P) 53.9 kg   Height:          Data Reviewed:  Basic Metabolic Panel: Recent Labs  Lab 08/07/24 0220 08/08/24 0936 08/11/24 0344  NA 132* 132* 137  K 4.5 4.6 4.7  CL 91* 91* 96*  CO2 31 26 27   GLUCOSE 106* 100* 86  BUN 33* 57* 55*  CREATININE 2.78* 4.70* 5.85*  CALCIUM  9.6  9.9 10.1 9.9  MG  --   --  2.9*  PHOS 4.3 5.9* 6.1*    CBC: Recent Labs  Lab 08/07/24 0220 08/08/24 0935 08/11/24 0344 08/12/24 0829  WBC 3.8* 3.5* 3.1* 4.8  NEUTROABS  --  2.3 1.9  --   HGB 9.1* 8.6* 8.7* 8.8*  HCT 29.6* 27.8* 26.9* 28.8*  MCV 101.4* 102.6* 99.6 104.3*  PLT 146* 131* 119* 141*    LFT Recent Labs  Lab 08/07/24 0220 08/08/24 0936 08/11/24 0344  AST  --   --  10*  ALT  --   --  11  ALKPHOS  --   --  73  BILITOT  --   --  0.7  PROT  --   --  5.8*  ALBUMIN  3.0* 2.9* 3.0*     Antibiotics: Anti-infectives (From admission, onward)    Start     Dose/Rate Route Frequency Ordered Stop   07/08/24 1530  fluconazole  (DIFLUCAN ) tablet 150 mg        150 mg Oral  Once 07/08/24 1437 07/08/24 1544   06/18/24 2030  ceFAZolin  (ANCEF ) IVPB 2g/100 mL premix  Status:  Discontinued        2 g 200 mL/hr over 30 Minutes Intravenous Every 8 hours 06/18/24 1454  06/19/24 1608   06/18/24 0600  ceFAZolin  (ANCEF ) IVPB 2g/100 mL premix        2 g 200 mL/hr over 30 Minutes Intravenous On call to O.R. 06/15/24 1152 06/18/24 1230   06/06/24 1400  piperacillin -tazobactam (ZOSYN ) IVPB 2.25 g        2.25 g 100 mL/hr over 30 Minutes Intravenous Every 8 hours 06/06/24 1017 06/09/24 0538   06/06/24 1200  vancomycin  (VANCOREADY) IVPB 500 mg/100 mL        500 mg 100 mL/hr over 60 Minutes Intravenous Every M-W-F (Hemodialysis) 06/04/24 1745 06/06/24 1900   06/04/24 1845  vancomycin  (  VANCOCIN ) IVPB 1000 mg/200 mL premix        1,000 mg 200 mL/hr over 60 Minutes Intravenous  Once 06/04/24 1745 06/04/24 1915   06/04/24 1830  ceFEPIme  (MAXIPIME ) 2 g in sodium chloride  0.9 % 100 mL IVPB  Status:  Discontinued        2 g 200 mL/hr over 30 Minutes Intravenous Every M-W-F (Hemodialysis) 06/04/24 1743 06/06/24 1017   05/27/24 1015  cefTRIAXone  (ROCEPHIN ) 2 g in sodium chloride  0.9 % 100 mL IVPB        2 g 200 mL/hr over 30 Minutes Intravenous Every 24 hours 05/27/24 0918 05/29/24 1014   05/23/24 1530  ceFEPIme  (MAXIPIME ) 1 g in sodium chloride  0.9 % 100 mL IVPB  Status:  Discontinued        1 g 200 mL/hr over 30 Minutes Intravenous Every 24 hours 05/23/24 1433 05/27/24 0918   05/22/24 2200  Ampicillin -Sulbactam (UNASYN ) 3 g in sodium chloride  0.9 % 100 mL IVPB  Status:  Discontinued        3 g 200 mL/hr over 30 Minutes Intravenous Every 24 hours 05/22/24 0834 05/23/24 1433   05/22/24 0930  Ampicillin -Sulbactam (UNASYN ) 3 g in sodium chloride  0.9 % 100 mL IVPB        3 g 200 mL/hr over 30 Minutes Intravenous  Once 05/22/24 0834 05/22/24 1900        CONSULTS neurosurgery, palliative care  Code Status: Full code  Family Communication:      Subjective   Sleeping in dialysis  3:26 PM Seen later after dialysis, denied needs or complaint - mom at bedside  Objective    Physical Examination:  General: No acute distress. Cardiovascular: RRR Lungs:  unlabored Neurological: sleeping during dialysis Extremities: No clubbing or cyanosis. No edema.   [name] [name]   [name] If 7PM-7AM, please contact night-coverage at [email or web address], Office  [phone]   08/12/2024, 2:47 PM  LOS: 85 days

## 2024-08-12 NOTE — TOC Progression Note (Signed)
 Transition of Care [facility]) - Progression Note    Patient Details  Name: [name] [name] MRN: [id number] Date of Birth: 1981-02-16  Transition of Care [facility]) CM/SW Contact  [name], [name] Phone Number: 08/12/2024, 4:05 PM  Clinical Narrative:   CSW spoke with regional with [facility], and there is a facility in [place] that is considering patient, but they are worried about what would happen with the patient's mother when patient's rehab runs out and she has to move to a private room. CSW answered questions about mother's goal to return back to [place] once the patient is improved, hoping to take the patient with her. Facility concerned if that will occur before [facility] cuts the patient off from rehab benefits. Facility still considering a bed offer, no response yet. No other bed offers at this time. CSW to follow.    Expected Discharge Plan: Skilled Nursing Facility Barriers to Discharge: No SNF bed, Insurance Authorization               Expected Discharge Plan and Services In-house Referral: Clinical Social Work   Post Acute Care Choice: Skilled Nursing Facility Living arrangements for the past 2 months: Apartment                                       Social Drivers of Health (SDOH) Interventions SDOH Screenings   Food Insecurity: Patient Unable To Answer (05/20/2024)  Housing: Unknown (07/14/2024)  Transportation Needs: Patient Unable To Answer (05/20/2024)  Utilities: Patient Unable To Answer (05/20/2024)  Social Connections: Unknown (10/04/2022)   Received from [facility]  Stress: No Stress Concern Present (04/16/2024)   Received from [facility]  Tobacco Use: Medium Risk (06/18/2024)    Readmission Risk Interventions    05/20/2024    4:16 PM 03/31/2024   11:27 AM  Readmission Risk Prevention Plan  Transportation Screening Complete Complete  Medication Review [facility]) Complete Complete  PCP or Specialist appointment within 3-5 days of  discharge Complete   HRI or Home Care Consult Complete Complete  SW Recovery Care/Counseling Consult Complete Complete  Palliative Care Screening Complete Not Applicable  Skilled Nursing Facility Complete Not Applicable

## 2024-08-13 LAB — BASIC METABOLIC PANEL WITH GFR
Anion gap: 11 (ref 5–15)
BUN: 34 mg/dL — ABNORMAL HIGH (ref 6–20)
CO2: 28 mmol/L (ref 22–32)
Calcium: 9.2 mg/dL (ref 8.9–10.3)
Chloride: 95 mmol/L — ABNORMAL LOW (ref 98–111)
Creatinine, Ser: 3.84 mg/dL — ABNORMAL HIGH (ref 0.44–1.00)
GFR, Estimated: 14 mL/min — ABNORMAL LOW (ref >60)
Glucose, Bld: 112 mg/dL — ABNORMAL HIGH (ref 70–99)
Potassium: 4.8 mmol/L (ref 3.5–5.1)
Sodium: 134 mmol/L — ABNORMAL LOW (ref 135–145)

## 2024-08-13 MED ORDER — SIMETHICONE 80 MG PO CHEW: 80.0000 mg | CHEWABLE_TABLET | Freq: Four times a day (QID) | ORAL | Status: DC | PRN

## 2024-08-13 MED ORDER — DICLOFENAC SODIUM 1 % EX GEL
2.0000 g | Freq: Four times a day (QID) | CUTANEOUS | Status: DC
  Administered 2024-08-13 – 2024-08-22 (×15): 2 g via TOPICAL
  Filled 2024-08-13: qty 100

## 2024-08-13 MED ORDER — LOPERAMIDE HCL 2 MG PO CAPS: 4.0000 mg | ORAL_CAPSULE | ORAL | Status: DC | PRN

## 2024-08-13 MED ORDER — CYCLOSPORINE MODIFIED (GENGRAF) 25 MG PO CAPS
225.0000 mg | ORAL_CAPSULE | Freq: Every day | ORAL | Status: DC
  Administered 2024-08-14 – 2024-08-16 (×3): 225 mg via ORAL
  Filled 2024-08-13 (×4): qty 9

## 2024-08-13 MED ORDER — MYCOPHENOLATE MOFETIL 250 MG PO CAPS
500.0000 mg | ORAL_CAPSULE | Freq: Two times a day (BID) | ORAL | Status: DC
  Administered 2024-08-13 – 2024-08-27 (×28): 500 mg via ORAL
  Filled 2024-08-13 (×27): qty 2

## 2024-08-13 MED ORDER — GABAPENTIN 100 MG PO CAPS
100.0000 mg | ORAL_CAPSULE | Freq: Every day | ORAL | Status: DC
  Administered 2024-08-14 – 2024-08-27 (×14): 100 mg via ORAL
  Filled 2024-08-13 (×14): qty 1

## 2024-08-13 MED ORDER — DM-GUAIFENESIN ER 30-600 MG PO TB12: 1.0000 | ORAL_TABLET | Freq: Two times a day (BID) | ORAL | Status: DC | PRN

## 2024-08-13 MED ORDER — CYCLOSPORINE MODIFIED (GENGRAF) 100 MG PO CAPS
200.0000 mg | ORAL_CAPSULE | Freq: Every evening | ORAL | Status: DC
  Administered 2024-08-13 – 2024-08-25 (×12): 200 mg via ORAL
  Filled 2024-08-13 (×8): qty 2
  Filled 2024-08-13 (×4): qty 8
  Filled 2024-08-13: qty 2
  Filled 2024-08-13: qty 8

## 2024-08-13 MED ORDER — CHLORHEXIDINE GLUCONATE CLOTH 2 % EX PADS
6.0000 | MEDICATED_PAD | Freq: Every day | CUTANEOUS | Status: DC
  Administered 2024-08-15: 6 via TOPICAL

## 2024-08-13 MED ORDER — SEVELAMER CARBONATE 800 MG PO TABS
800.0000 mg | ORAL_TABLET | Freq: Three times a day (TID) | ORAL | Status: DC
  Administered 2024-08-13 – 2024-08-20 (×18): 800 mg via ORAL
  Filled 2024-08-13 (×18): qty 1

## 2024-08-13 NOTE — Progress Notes (Signed)
 Physical Therapy Treatment Patient Details Name: [name] [name] MRN: [id number] DOB: July 16, 1981 Today's Date: 08/13/2024   History of Present Illness 43 yo female presents 05/19/24 for left gaze and L side weakness. 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 Intubated 9/15-9/23. EVD 9/15- 10/15; s/p shunt 10/15. S/p laparoscopy with intra-abdominal assistance ventricular-peritoneal shunt placement 10/15. PMH: HTN, ESRD on HD s/p renal and pancreatic transplants, DM, gastroparesis, multiple strokes (ischemic and hemorrhagic) with prepontine SAH, hydrocephalus requiring EVD placement    PT Comments  Pt tolerated treatment well today. Pt with similar presentation to previous session. Able to ambulate just past nurses station with RW CGA and a chair follow. Pt mother once again present and very encouraging. May try rollator next session. No change in DC/DME recs at this time. PT will continue to follow.     If plan is discharge home, recommend the following: A lot of help with walking and/or transfers;A lot of help with bathing/dressing/bathroom;Assistance with cooking/housework;Direct supervision/assist for medications management;Direct supervision/assist for financial management;Assist for transportation;Help with stairs or ramp for entrance   Can travel by private vehicle     No  Equipment Recommendations  Hospital bed;Hoyer lift;Wheelchair (measurements PT);Wheelchair cushion (measurements PT);BSC/3in1    Recommendations for Other Services       Precautions / Restrictions Precautions Precautions: Fall Recall of Precautions/Restrictions: Impaired Precaution/Restrictions Comments: PEG Restrictions Weight Bearing Restrictions Per Provider Order: No     Mobility  Bed Mobility Overal bed mobility: Needs Assistance Bed Mobility: Sit to Supine       Sit to supine:  Contact guard assist, HOB elevated, Used rails   General bed mobility comments: CGA for safety to return to bed.    Transfers Overall transfer level: Needs assistance Equipment used: Rolling walker (2 wheels) Transfers: Sit to/from Stand Sit to Stand: Contact guard assist, Min assist           General transfer comment: cues for hand placement and posture. Multiple sit to stands performed from chair.    Ambulation/Gait Ambulation/Gait assistance: +2 safety/equipment, Contact guard assist Gait Distance (Feet): 165 Feet [phone]) Assistive device: Rolling walker (2 wheels) Gait Pattern/deviations: Step-through pattern, Decreased stride length, Trunk flexed, Shuffle Gait velocity: Dec     General Gait Details: Pt today was able to progress ambulation in the hallway with RW CGA just past the nurses station. 2 seated rest breaks and cues for proximity to RW. Mother present, encouraging, and provided chair follow.   Stairs             Wheelchair Mobility     Tilt Bed    Modified Rankin (Stroke Patients Only)       Balance Overall balance assessment: Needs assistance Sitting-balance support: No upper extremity supported, Feet supported Sitting balance-Leahy Scale: Good Sitting balance - Comments: EOB   Standing balance support: Bilateral upper extremity supported, During functional activity, Reliant on assistive device for balance Standing balance-Leahy Scale: Poor Standing balance comment: able to stand at sink with no UE support but leaned on sink for support                            Communication Communication Communication: No apparent difficulties  Cognition Arousal: Alert Behavior During Therapy: [facility] for tasks assessed/performed  Following commands: Impaired Following commands impaired: Follows one step commands with increased time, Follows multi-step commands inconsistently    Cueing Cueing  Techniques: Verbal cues, Gestural cues  Exercises      General Comments General comments (skin integrity, edema, etc.): UE HEP provided      Pertinent Vitals/Pain Pain Assessment Pain Assessment: No/denies pain    Home Living                          Prior Function            PT Goals (current goals can now be found in the care plan section) Progress towards PT goals: Progressing toward goals    Frequency    Min 2X/week      PT Plan      Co-evaluation              AM-PAC PT 6 Clicks Mobility   Outcome Measure  Help needed turning from your back to your side while in a flat bed without using bedrails?: A Little Help needed moving from lying on your back to sitting on the side of a flat bed without using bedrails?: A Lot Help needed moving to and from a bed to a chair (including a wheelchair)?: A Little Help needed standing up from a chair using your arms (e.g., wheelchair or bedside chair)?: A Little Help needed to walk in hospital room?: A Little Help needed climbing 3-5 steps with a railing? : A Lot 6 Click Score: 16    End of Session Equipment Utilized During Treatment: Gait belt Activity Tolerance: Patient tolerated treatment well Patient left: with call bell/phone within reach;with family/visitor present;in bed Nurse Communication: Mobility status PT Visit Diagnosis: Unsteadiness on feet (R26.81);Other abnormalities of gait and mobility (R26.89);Muscle weakness (generalized) (M62.81);Difficulty in walking, not elsewhere classified (R26.2);Other symptoms and signs involving the nervous system (R29.898)     Time: 8563-8542 PT Time Calculation (min) (ACUTE ONLY): 21 min  Charges:    $Gait Training: 8-22 mins PT General Charges $$ ACUTE PT VISIT: 1 Visit                     Jenilee Franey B, PT, DPT Acute Rehab Services [phone]    Judia Arnott 08/13/2024, 3:14 PM

## 2024-08-13 NOTE — Progress Notes (Signed)
 Occupational Therapy Treatment Patient Details Name: [name] [name] MRN: [id number] DOB: June 27, 1981 Today's Date: 08/13/2024   History of present illness 43 yo female presents 05/19/24 for left gaze and L side weakness. 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 Intubated 9/15-9/23. EVD 9/15- 10/15; s/p shunt 10/15. S/p laparoscopy with intra-abdominal assistance ventricular-peritoneal shunt placement 10/15. PMH: HTN, ESRD on HD s/p renal and pancreatic transplants, DM, gastroparesis, multiple strokes (ischemic and hemorrhagic) with prepontine SAH, hydrocephalus requiring EVD placement   OT comments  Patient demonstrating good gain with OT treatment. Patient ambulating in room with mother upon entry to sit on EOB.  Patient able to donn socks seated on EOB with supervision and able to ambulate to sink for hand washing.  Patient able to ambulate to recliner and performed UE HEP with written HEP provided.  Patient will benefit from continued inpatient follow up therapy, <3 hours/day.  Acute OT to continue to follow to address established goals to facilitate DC to next venue of care.        If plan is discharge home, recommend the following:  Assistance with cooking/housework;Assist for transportation;Help with stairs or ramp for entrance;A little help with walking and/or transfers;Direct supervision/assist for medications management;Direct supervision/assist for financial management;Supervision due to cognitive status;A little help with bathing/dressing/bathroom   Equipment Recommendations  BSC/3in1    Recommendations for Other Services      Precautions / Restrictions Precautions Precautions: Fall Recall of Precautions/Restrictions: Impaired Precaution/Restrictions Comments: PEG Restrictions Weight Bearing Restrictions Per Provider Order: No       Mobility Bed  Mobility Overal bed mobility: Needs Assistance             General bed mobility comments: on EOB upon entry and in recliner at end of session    Transfers Overall transfer level: Needs assistance Equipment used: Rolling walker (2 wheels) Transfers: Sit to/from Stand Sit to Stand: Contact guard assist, Min assist           General transfer comment: cues for hand placement and posture     Balance Overall balance assessment: Needs assistance Sitting-balance support: No upper extremity supported, Feet supported Sitting balance-Leahy Scale: Good Sitting balance - Comments: EOB   Standing balance support: Bilateral upper extremity supported, During functional activity, Reliant on assistive device for balance Standing balance-Leahy Scale: Poor Standing balance comment: able to stand at sink with no UE support but leaned on sink for support                           ADL either performed or assessed with clinical judgement   ADL Overall ADL's : Needs assistance/impaired Eating/Feeding: Set up   Grooming: Contact guard assist;Wash/dry hands;Standing Grooming Details (indicate cue type and reason): at sink             Lower Body Dressing: Supervision/safety;Sitting/lateral leans Lower Body Dressing Details (indicate cue type and reason): to donn socks seated on EOB               General ADL Comments: Patient's mother often assists patient with self care    Extremity/Trunk Assessment              Vision       Perception     Praxis     Communication Communication Communication: No apparent difficulties   Cognition Arousal: Alert Behavior During Therapy: [facility]  for tasks assessed/performed Cognition: Cognition impaired     Awareness: Intellectual awareness impaired Memory impairment (select all impairments): Short-term memory, Working memory Attention impairment (select first level of impairment): Sustained attention Executive functioning  impairment (select all impairments): Initiation, Organization, Sequencing, Problem solving                   Following commands: Impaired Following commands impaired: Follows one step commands with increased time, Follows multi-step commands inconsistently      Cueing   Cueing Techniques: Verbal cues, Gestural cues  Exercises Exercises: General Upper Extremity General Exercises - Upper Extremity Shoulder Flexion: AROM, Strengthening, Left, 10 reps, Seated, Theraband Theraband Level (Shoulder Flexion): Level 1 (Yellow) Shoulder Horizontal ABduction: AROM, Both, Seated, 10 reps, Theraband Theraband Level (Shoulder Horizontal Abduction): Level 1 (Yellow) Elbow Flexion: AROM, Strengthening, Left, 10 reps, Seated, Theraband Theraband Level (Elbow Flexion): Level 1 (Yellow) Elbow Extension: AROM, Strengthening, Left, 10 reps, Seated, Theraband Theraband Level (Elbow Extension): Level 1 (Yellow)    Shoulder Instructions       General Comments UE HEP provided    Pertinent Vitals/ Pain       Pain Assessment Pain Assessment: No/denies pain  Home Living                                          Prior Functioning/Environment              Frequency  Min 2X/week        Progress Toward Goals  OT Goals(current goals can now be found in the care plan section)  Progress towards OT goals: Progressing toward goals  Acute Rehab OT Goals Patient Stated Goal: to get better OT Goal Formulation: With patient/family Time For Goal Achievement: 08/20/24 Potential to Achieve Goals: Good ADL Goals Pt Will Perform Grooming: with supervision;standing Pt Will Perform Lower Body Dressing: with supervision;sit to/from stand Pt Will Transfer to Toilet: with contact guard assist;ambulating;regular height toilet;grab bars Pt/caregiver will Perform Home Exercise Program: Both right and left upper extremity;With written HEP provided;Increased strength Additional ADL Goal  #1: Pt will follow 2-step commands 75% of the time during ADLs and functional mobility. Additional ADL Goal #2: Pt will sustain attention to ADL task for ~5 minutes with min cues.  Plan      Co-evaluation                 AM-PAC OT 6 Clicks Daily Activity     Outcome Measure   Help from another person eating meals?: A Little Help from another person taking care of personal grooming?: A Little Help from another person toileting, which includes using toliet, bedpan, or urinal?: A Little Help from another person bathing (including washing, rinsing, drying)?: A Lot Help from another person to put on and taking off regular upper body clothing?: A Little Help from another person to put on and taking off regular lower body clothing?: A Little 6 Click Score: 17    End of Session Equipment Utilized During Treatment: Gait belt;Rolling walker (2 wheels)  OT Visit Diagnosis: Unsteadiness on feet (R26.81);Muscle weakness (generalized) (M62.81);History of falling (Z91.81);Other symptoms and signs involving cognitive function;Cognitive communication deficit (R41.841);Low vision, both eyes (H54.2) Symptoms and signs involving cognitive functions: Nontraumatic SAH   Activity Tolerance Patient tolerated treatment well   Patient Left in chair;with call bell/phone within reach;with chair alarm set;with family/visitor present   Nurse Communication  Mobility status        Time: 9042-8977 OT Time Calculation (min): 25 min  Charges: OT General Charges $OT Visit: 1 Visit OT Treatments $Self Care/Home Management : 8-22 mins $Therapeutic Exercise: 8-22 mins  [name], OTA Acute Rehabilitation Services  Office [phone]   [name] 08/13/2024, 12:24 PM

## 2024-08-13 NOTE — Progress Notes (Signed)
 [facility] Progress Note   Subjective:  Seen in room. No issues with dialysis yesterday. Mother says has been off tube feeds for last four nights.   Objective Vitals:   08/12/24 1331 08/12/24 1533 08/12/24 2217 08/13/24 0558  BP: (!) 157/70 (!) 118/59 (!) 161/72 (!) 179/63  Pulse: 82 87 75 80  Resp: (!) 21 20 16 18   Temp: 98.9 F (37.2 C) 98.6 F (37 C) 98.7 F (37.1 C) 98.4 F (36.9 C)  TempSrc: Oral Oral Oral Oral  SpO2: 99% 100% 98%   Weight:      Height:       Physical Exam General: alert female in NAD Heart: RRR Lungs: CTAB, nml WOB on RA Abdomen: soft, NTND Extremities: no LE edema Dialysis Access: LU AVF   Filed Weights   08/12/24 0835 08/12/24 1159 08/12/24 1211  Weight: 57 kg 56.4 kg (P) 53.9 kg    Intake/Output Summary (Last 24 hours) at 08/13/2024 1113 Last data filed at 08/12/2024 2219 Gross per 24 hour  Intake 180 ml  Output 1700 ml  Net -1520 ml    Additional Objective Labs: Basic Metabolic Panel: Recent Labs  Lab 08/08/24 0936 08/11/24 0344 08/12/24 0829 08/13/24 0157  NA 132* 137 135 134*  K 4.6 4.7 5.3* 4.8  CL 91* 96* 96* 95*  CO2 26 27 23 28   GLUCOSE 100* 86 151* 112*  BUN 57* 55* 77* 34*  CREATININE 4.70* 5.85* 7.18* 3.84*  CALCIUM  10.1 9.9 9.9 9.2  PHOS 5.9* 6.1* 6.4*  --    Liver Function Tests: Recent Labs  Lab 08/08/24 0936 08/11/24 0344 08/12/24 0829  AST  --  10*  --   ALT  --  11  --   ALKPHOS  --  73  --   BILITOT  --  0.7  --   PROT  --  5.8*  --   ALBUMIN  2.9* 3.0* 3.1*   CBC: Recent Labs  Lab 08/07/24 0220 08/08/24 0935 08/11/24 0344 08/12/24 0829  WBC 3.8* 3.5* 3.1* 4.8  NEUTROABS  --  2.3 1.9  --   HGB 9.1* 8.6* 8.7* 8.8*  HCT 29.6* 27.8* 26.9* 28.8*  MCV 101.4* 102.6* 99.6 104.3*  PLT 146* 131* 119* 141*   Blood Culture    Component Value Date/Time   SDES CSF 06/11/2024 0920   SPECREQUEST LP 06/11/2024 0920   CULT  06/11/2024 0920    NO GROWTH 3 DAYS Performed at [facility], [address] [place], [name] [address]    REPTSTATUS 06/14/2024 FINAL 06/11/2024 0920   CBG: Recent Labs  Lab 08/10/24 0311 08/10/24 0827 08/10/24 1148 08/10/24 1753 08/12/24 1338  GLUCAP 85 101* 161* 124* 143*   Iron  Studies:  No results for input(s): IRON , TIBC, TRANSFERRIN, FERRITIN in the last 72 hours.  Lab Results  Component Value Date   INR 1.1 06/18/2024   INR 1.1 06/16/2024   INR 1.1 05/20/2024   Studies/Results: No results found.  Medications:    amLODipine   10 mg Oral Daily   carvedilol   25 mg Oral BID WC   cinacalcet   30 mg Oral Q M,W,F   cycloSPORINE  modified  200 mg Oral QPM   [START ON 08/14/2024] cycloSPORINE  modified  225 mg Oral Daily   darbepoetin (ARANESP ) injection - DIALYSIS  200 mcg Subcutaneous Q Thu-1800   famotidine   10 mg Oral QHS   [START ON 08/14/2024] gabapentin   100 mg Oral Daily   hydrALAZINE   25  mg Oral Q8H    HYDROmorphone  (DILAUDID ) injection  0.25 mg Intravenous Once   lacosamide   100 mg Oral BID   losartan   25 mg Oral Q1200   multivitamin  1 tablet Oral QHS   mycophenolate   500 mg Oral BID   pantoprazole   40 mg Oral Daily   predniSONE   15 mg Oral Q breakfast    Dialysis Orders: 3:45hr, 400/A1.5, EDW 55.2kg, 2K/2Ca bath, AVF, no heparin  - Prev on Mircera 150mcg IV q 2 weeks - No VDRA   Assessment/Plan: Acute R intracranial hemorrhage: 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 Needs ongoing rehab. Doing a lot better.  ESRD:  HD on MWF.  HD off schedule this week. Next HD Thurs.  HTN/volume: BP somewhat labile - elevated again today.  Appearing euvolemic. Cont current meds - carvedilol  25mg  BID, hydralazine  25mg  TID, losartan  .  Amlodipine  10 mg daily. Continue titrate meds.   UF with HD  Anemia of ESRD: Hgb 8s > 9s, transfuse prn. Continue Aranesp  200 q Thurs. 12/6 iron  sat 31%.  Secondary HPTH: CorrCa high  likely due to immobility. Phos remains elevated. Will add Renvela .  Continue sensipar , not on  VDRA. Pth 126 on 08/07/24.  Nutrition: Alb low, supp per RD. Off tube feeds.  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 Dispo: Insurance declined LTAC, and SNF, appeal pending. Tolerated HD in recliner on multiple days  [name] [name] Ronnald Acosta PA-C [facility] 08/13/2024,11:13 AM

## 2024-08-13 NOTE — Progress Notes (Signed)
 Yes [facility]  Hospitalist  PROGRESS NOTE  [name] [name] FMW:[id number] DOB: 12-12-1980 DOA: 05/19/2024 PCP: [facility]   Brief HPI:    43-yrs-old female with past medical history of hypertension, CVA, type 1 diabetes, gastroparesis, kidney and pancreatic transplant 2014 with subsequent failure of renal transplant now on end-stage renal dialysis, anxiety, narcotic abuse presented to the hospital from dialysis unit on 05/19/2024 as code stroke.  Patient was noted to have hemorrhagic stroke.  Neurology, neurosurgery in critical care were consulted.  EVD was placed on the left side and was initially admitted to neuro ICU.  She was also intubated and had prolonged intubation.  Subsequently, patient was extubated on 05/27/2024.   Important events:  9/15 admitted with ICH plus IVH, EVD was placed for hydrocephalus, intubated 9/22 palliative care meeting.  Family is insistent that they want full scope of care 9/23 Extubated 10/1 -developed fevers pancultures drawn and broad-spectrum antibiotics started 10/2 became afebrile after starting antibiotics, did not tolerate raising EVD 10/6 EVD was raised from 10 to 30 cm water , tolerating well, remained afebrile 10/7 EVD is at 30 cm water , CT scan will be done tomorrow morning, no overnight issues 10/8 she became somnolent, CT head showed increasing hydrocephalus, EVD was titrated down to 10 cm of water  with improvement in mental status 10/15 VP shunt placement 10/17 transferred to progressive unit with [name]  11/18-medically stable.  Awaiting for disposition.  Insurance declined LTAC and skilled nursing facility, appeal pending. 12/3-tube feeding changed to nocturnal tube feeds, started on p.o. diet    Assessment/Plan:   Acute right parietotemporal intraparenchymal hemorrhage / Intraventricular hemorrhage: Uncontrolled hypertension: Obstructive hydrocephalus s/p EVD changed to VP shunt: Seizure disorder S/p VP shunt placement on 06/18/2024  by Dr. Mavis.  Continue Vimpat .   Neurosurgery intermittently followed the patient during hospitalization.   Left forehead stitch removed   ESRD on dialysis: History of failed renal transplant: Secondary hyperparathyroidism: Nephrology on board.  Continue CellCept  and prednisone .   On Monday, Wednesday, Friday hemodialysis schedule.   TOC is working on finding outpatient hemodialysis set up  Hyponatremia trend  History of pancreatic/kidney transplantation:  Continue cyclosporine , mycophenolate , and prednisone  (will continue 15 mg dose based on discussion with renal)   Hypertension:  -Continue Coreg  25 mg p.o. twice daily -amlodipine  10 mg   Dizziness -resolved  -Symptoms mainly on standing from sitting position - will follow symptoms with RN (no  recent complaints of dizziness, will defer orthostatics)  Anemia of chronic disease:  Latest hemoglobin  8.5.. Transfuse if Hb below 7.0   Type 1 diabetes with vasculopathy/gastroparesis:  Not on long-term insulin  anymore due to pancreatic transplant.  A1c 3.9 Follow BG intermittently on BMP    Moderate protein calorie malnutrition/dysphagia:  S/p PEG She's taking PO well, will d/c nightly tube feeds   Debility/deconditioning/disposition/goals of care:  Palliative care had seen the patient during hospitalization.  Palliative care has signed off 10/17.  Disposition pending.  TOC on board.    No new needs  DVT prophylaxis: SCDs  Medications     amLODipine   10 mg Oral Daily   carvedilol   25 mg Oral BID WC   [START ON 08/14/2024] Chlorhexidine  Gluconate Cloth  6 each Topical Q0600   cinacalcet   30 mg Oral Q M,W,F   cycloSPORINE  modified  200 mg Oral QPM   [START ON 08/14/2024] cycloSPORINE  modified  225 mg Oral Daily   darbepoetin (ARANESP ) injection - DIALYSIS  200 mcg Subcutaneous Q Thu-1800   famotidine   10  mg Oral QHS   [START ON 08/14/2024] gabapentin   100 mg Oral Daily   hydrALAZINE   25 mg Oral Q8H     HYDROmorphone  (DILAUDID ) injection  0.25 mg Intravenous Once   lacosamide   100 mg Oral BID   losartan   25 mg Oral Q1200   multivitamin  1 tablet Oral QHS   mycophenolate   500 mg Oral BID   pantoprazole   40 mg Oral Daily   predniSONE   15 mg Oral Q breakfast   sevelamer  carbonate  800 mg Oral TID WC     Data Reviewed:   CBG:  Recent Labs  Lab 08/10/24 0311 08/10/24 0827 08/10/24 1148 08/10/24 1753 08/12/24 1338  GLUCAP 85 101* 161* 124* 143*    SpO2: 98 % O2 Flow Rate (L/min): 100 L/min FiO2 (%): 40 %    Vitals:   08/12/24 1331 08/12/24 1533 08/12/24 2217 08/13/24 0558  BP: (!) 157/70 (!) 118/59 (!) 161/72 (!) 179/63  Pulse: 82 87 75 80  Resp: (!) 21 20 16 18   Temp: 98.9 F (37.2 C) 98.6 F (37 C) 98.7 F (37.1 C) 98.4 F (36.9 C)  TempSrc: Oral Oral Oral Oral  SpO2: 99% 100% 98%   Weight:      Height:          Data Reviewed:  Basic Metabolic Panel: Recent Labs  Lab 08/07/24 0220 08/08/24 0936 08/11/24 0344 08/12/24 0829 08/13/24 0157  NA 132* 132* 137 135 134*  K 4.5 4.6 4.7 5.3* 4.8  CL 91* 91* 96* 96* 95*  CO2 31 26 27 23 28   GLUCOSE 106* 100* 86 151* 112*  BUN 33* 57* 55* 77* 34*  CREATININE 2.78* 4.70* 5.85* 7.18* 3.84*  CALCIUM  9.6  9.9 10.1 9.9 9.9 9.2  MG  --   --  2.9*  --   --   PHOS 4.3 5.9* 6.1* 6.4*  --     CBC: Recent Labs  Lab 08/07/24 0220 08/08/24 0935 08/11/24 0344 08/12/24 0829  WBC 3.8* 3.5* 3.1* 4.8  NEUTROABS  --  2.3 1.9  --   HGB 9.1* 8.6* 8.7* 8.8*  HCT 29.6* 27.8* 26.9* 28.8*  MCV 101.4* 102.6* 99.6 104.3*  PLT 146* 131* 119* 141*    LFT Recent Labs  Lab 08/07/24 0220 08/08/24 0936 08/11/24 0344 08/12/24 0829  AST  --   --  10*  --   ALT  --   --  11  --   ALKPHOS  --   --  73  --   BILITOT  --   --  0.7  --   PROT  --   --  5.8*  --   ALBUMIN  3.0* 2.9* 3.0* 3.1*     Antibiotics: Anti-infectives (From admission, onward)    Start     Dose/Rate Route Frequency Ordered Stop   07/08/24 1530   fluconazole  (DIFLUCAN ) tablet 150 mg        150 mg Oral  Once 07/08/24 1437 07/08/24 1544   06/18/24 2030  ceFAZolin  (ANCEF ) IVPB 2g/100 mL premix  Status:  Discontinued        2 g 200 mL/hr over 30 Minutes Intravenous Every 8 hours 06/18/24 1454 06/19/24 1608   06/18/24 0600  ceFAZolin  (ANCEF ) IVPB 2g/100 mL premix        2 g 200 mL/hr over 30 Minutes Intravenous On call to O.R. 06/15/24 1152 06/18/24 1230   06/06/24 1400  piperacillin -tazobactam (ZOSYN ) IVPB 2.25 g  2.25 g 100 mL/hr over 30 Minutes Intravenous Every 8 hours 06/06/24 1017 06/09/24 0538   06/06/24 1200  vancomycin  (VANCOREADY) IVPB 500 mg/100 mL        500 mg 100 mL/hr over 60 Minutes Intravenous Every M-W-F (Hemodialysis) 06/04/24 1745 06/06/24 1900   06/04/24 1845  vancomycin  (VANCOCIN ) IVPB 1000 mg/200 mL premix        1,000 mg 200 mL/hr over 60 Minutes Intravenous  Once 06/04/24 1745 06/04/24 1915   06/04/24 1830  ceFEPIme  (MAXIPIME ) 2 g in sodium chloride  0.9 % 100 mL IVPB  Status:  Discontinued        2 g 200 mL/hr over 30 Minutes Intravenous Every M-W-F (Hemodialysis) 06/04/24 1743 06/06/24 1017   05/27/24 1015  cefTRIAXone  (ROCEPHIN ) 2 g in sodium chloride  0.9 % 100 mL IVPB        2 g 200 mL/hr over 30 Minutes Intravenous Every 24 hours 05/27/24 0918 05/29/24 1014   05/23/24 1530  ceFEPIme  (MAXIPIME ) 1 g in sodium chloride  0.9 % 100 mL IVPB  Status:  Discontinued        1 g 200 mL/hr over 30 Minutes Intravenous Every 24 hours 05/23/24 1433 05/27/24 0918   05/22/24 2200  Ampicillin -Sulbactam (UNASYN ) 3 g in sodium chloride  0.9 % 100 mL IVPB  Status:  Discontinued        3 g 200 mL/hr over 30 Minutes Intravenous Every 24 hours 05/22/24 0834 05/23/24 1433   05/22/24 0930  Ampicillin -Sulbactam (UNASYN ) 3 g in sodium chloride  0.9 % 100 mL IVPB        3 g 200 mL/hr over 30 Minutes Intravenous  Once 05/22/24 0834 05/22/24 1900        CONSULTS neurosurgery, palliative care  Code Status: Full  code  Family Communication:      Subjective   Sleeping, I spoke with her mother at bedside who noted things were going well - no complaints I did not wake her   Objective    Physical Examination:  General: No acute distress. Lungs: unlabored Neurological: sleeping Extremities: No clubbing or cyanosis. No edema.  Meliton Monte   [name] If 7PM-7AM, please contact night-coverage at [email or web address], Office  [phone]   08/13/2024, 2:26 PM  LOS: 86 days

## 2024-08-14 ENCOUNTER — Inpatient Hospital Stay: Payer: Self-pay | Admitting: Neurology

## 2024-08-14 DIAGNOSIS — I10 Essential (primary) hypertension: Secondary | ICD-10-CM

## 2024-08-14 LAB — RENAL FUNCTION PANEL
Albumin: 2.9 g/dL — ABNORMAL LOW (ref 3.5–5.0)
Anion gap: 14 (ref 5–15)
BUN: 55 mg/dL — ABNORMAL HIGH (ref 6–20)
CO2: 25 mmol/L (ref 22–32)
Calcium: 9.4 mg/dL (ref 8.9–10.3)
Chloride: 98 mmol/L (ref 98–111)
Creatinine, Ser: 4.96 mg/dL — ABNORMAL HIGH (ref 0.44–1.00)
GFR, Estimated: 11 mL/min — ABNORMAL LOW (ref >60)
Glucose, Bld: 107 mg/dL — ABNORMAL HIGH (ref 70–99)
Phosphorus: 6.2 mg/dL — ABNORMAL HIGH (ref 2.5–4.6)
Potassium: 5.4 mmol/L — ABNORMAL HIGH (ref 3.5–5.1)
Sodium: 137 mmol/L (ref 135–145)

## 2024-08-14 LAB — CBC
HCT: 28.7 % — ABNORMAL LOW (ref 36.0–46.0)
Hemoglobin: 9 g/dL — ABNORMAL LOW (ref 12.0–15.0)
MCH: 32.1 pg (ref 26.0–34.0)
MCHC: 31.4 g/dL (ref 30.0–36.0)
MCV: 102.5 fL — ABNORMAL HIGH (ref 80.0–100.0)
Platelets: 114 K/uL — ABNORMAL LOW (ref 150–400)
RBC: 2.8 MIL/uL — ABNORMAL LOW (ref 3.87–5.11)
RDW: 14.5 % (ref 11.5–15.5)
WBC: 3.2 K/uL — ABNORMAL LOW (ref 4.0–10.5)
nRBC: 0 % (ref 0.0–0.2)

## 2024-08-14 LAB — HEPATITIS B SURFACE ANTIGEN: Hepatitis B Surface Ag: NONREACTIVE

## 2024-08-14 MED ORDER — OXYCODONE HCL 5 MG PO TABS
ORAL_TABLET | ORAL | Status: AC
  Filled 2024-08-14: qty 1

## 2024-08-14 MED ORDER — ALTEPLASE 2 MG IJ SOLR: 2.0000 mg | Freq: Once | INTRAMUSCULAR | Status: DC | PRN

## 2024-08-14 MED ORDER — HEPARIN SODIUM (PORCINE) 1000 UNIT/ML DIALYSIS: 1000.0000 [IU] | INTRAMUSCULAR | Status: DC | PRN

## 2024-08-14 MED ORDER — ANTICOAGULANT SODIUM CITRATE 4% (200MG/5ML) IV SOLN: 5.0000 mL | Status: DC | PRN

## 2024-08-14 MED ORDER — LIDOCAINE HCL (PF) 1 % IJ SOLN: 5.0000 mL | INTRAMUSCULAR | Status: DC | PRN

## 2024-08-14 MED ORDER — ACETAMINOPHEN 325 MG PO TABS
ORAL_TABLET | ORAL | Status: AC
  Filled 2024-08-14: qty 2

## 2024-08-14 MED ORDER — LIDOCAINE-PRILOCAINE 2.5-2.5 % EX CREA: 1.0000 | TOPICAL_CREAM | CUTANEOUS | Status: DC | PRN

## 2024-08-14 MED ORDER — HEPARIN SODIUM (PORCINE) 1000 UNIT/ML DIALYSIS: 20.0000 [IU]/kg | INTRAMUSCULAR | Status: DC | PRN

## 2024-08-14 MED ORDER — PENTAFLUOROPROP-TETRAFLUOROETH EX AERO: 1.0000 | INHALATION_SPRAY | CUTANEOUS | Status: DC | PRN

## 2024-08-14 NOTE — Progress Notes (Signed)
 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  Patient was noted to have hemorrhagic stroke.  Neurology, neurosurgery in critical care were consulted.  EVD was placed on the left side and was initially admitted to neuro ICU.  She was also intubated and had prolonged intubation.  Subsequently, patient was extubated on 05/27/2024.   Important events:  9/15 admitted with ICH plus IVH, EVD was placed for hydrocephalus, intubated 9/22 palliative care meeting.  Family is insistent that they want full scope of care 9/23 Extubated 10/1 -developed fevers pancultures drawn and broad-spectrum antibiotics started 10/2 became afebrile after starting antibiotics, did not tolerate raising EVD 10/6 EVD was raised from 10 to 30 cm water , tolerating well, remained afebrile 10/7 EVD is at 30 cm water , CT scan will be done tomorrow morning, no overnight issues 10/8 she became somnolent, CT head showed increasing hydrocephalus, EVD was titrated down to 10 cm of water  with improvement in mental status 10/15 VP shunt placement 10/17 transferred to progressive unit with [name]  11/18-medically stable.  Awaiting for disposition.  Insurance declined LTAC and skilled nursing facility, appeal pending. 12/3-tube feeding changed to nocturnal tube feeds, started on p.o. diet    Assessment/Plan:   Acute right parietotemporal intraparenchymal hemorrhage / Intraventricular hemorrhage: Uncontrolled hypertension: Obstructive hydrocephalus [name]/p EVD changed to VP shunt: Seizure disorder [name]/p VP shunt placement on 06/18/2024  by Dr. Mavis.  Continue Vimpat .   Neurosurgery intermittently followed the patient during hospitalization.   Currently stable   ESRD on dialysis: History of failed renal transplant: Secondary hyperparathyroidism: Nephrology on board.  Continue CellCept  and prednisone .   On Monday, Wednesday, Friday hemodialysis schedule.   TOC is working on finding outpatient hemodialysis set up  Hyponatremia Chronic, nephrology following Patient on hemodialysis    History of pancreatic/kidney transplantation:  Continue cyclosporine , mycophenolate , and prednisone .   Hypertension:  Blood pressure is mildly elevated Continue Coreg  25 mg p.o. twice daily, hydralazine  25 mg every 8 hours, losartan  25 mg daily Continue amlodipine  to 10 mg daily  Dizziness resolved   Anemia of chronic disease:  Latest hemoglobin  8.5.. Transfuse if Hb below 7.0   Type 1 diabetes with vasculopathy/gastroparesis:  Not on long-term insulin  anymore due to pancreatic transplant.  She is currently n.p.o. for swallow evaluation/MBS Check CBG every 8 hours   Moderate protein calorie malnutrition/dysphagia:  Status post PEG tube feeding, discontinued as patient is taking p.o. diet well     Debility/deconditioning/disposition/goals of care:  Palliative care had seen the patient during hospitalization.  Family has insisted full code.   Palliative care has signed off 10/17.  Disposition pending.  TOC on board.  Patient has been seen by physical therapy during hospitalization and patient requiring significant help      DVT prophylaxis: SCDs  Medications     amLODipine   10 mg Oral Daily   carvedilol   25 mg Oral BID WC   Chlorhexidine  Gluconate Cloth  6 each Topical Q0600   cinacalcet   30 mg Oral Q M,W,F   cycloSPORINE  modified  200 mg Oral QPM   cycloSPORINE  modified  225 mg Oral Daily   darbepoetin (ARANESP ) injection -  DIALYSIS  200 mcg Subcutaneous Q Thu-1800   diclofenac  Sodium  2 g Topical QID    famotidine   10 mg Oral QHS   gabapentin   100 mg Oral Daily   hydrALAZINE   25 mg Oral Q8H    HYDROmorphone  (DILAUDID ) injection  0.25 mg Intravenous Once   lacosamide   100 mg Oral BID   losartan   25 mg Oral Q1200   multivitamin  1 tablet Oral QHS   mycophenolate   500 mg Oral BID   pantoprazole   40 mg Oral Daily   predniSONE   15 mg Oral Q breakfast   sevelamer  carbonate  800 mg Oral TID WC     Data Reviewed:   CBG:  Recent Labs  Lab 08/10/24 0311 08/10/24 0827 08/10/24 1148 08/10/24 1753 08/12/24 1338  GLUCAP 85 101* 161* 124* 143*    SpO2: 98 % O2 Flow Rate (L/min): 100 L/min FiO2 (%): 40 %    Vitals:   08/14/24 1030 08/14/24 1111 08/14/24 1115 08/14/24 1151  BP: (!) 158/67 (!) 165/70 (!) 162/112 (!) 168/69  Pulse: 91 88 91 87  Resp: 18 16 (!) 22 14  Temp:   97.9 F (36.6 C) 98.4 F (36.9 C)  TempSrc:    Oral  SpO2: 98% 99% 96% 98%  Weight:      Height:          Data Reviewed:  Basic Metabolic Panel: Recent Labs  Lab 08/08/24 0936 08/11/24 0344 08/12/24 0829 08/13/24 0157 08/14/24 0230  NA 132* 137 135 134* 137  K 4.6 4.7 5.3* 4.8 5.4*  CL 91* 96* 96* 95* 98  CO2 26 27 23 28 25   GLUCOSE 100* 86 151* 112* 107*  BUN 57* 55* 77* 34* 55*  CREATININE 4.70* 5.85* 7.18* 3.84* 4.96*  CALCIUM  10.1 9.9 9.9 9.2 9.4  MG  --  2.9*  --   --   --   PHOS 5.9* 6.1* 6.4*  --  6.2*    CBC: Recent Labs  Lab 08/08/24 0935 08/11/24 0344 08/12/24 0829 08/14/24 0230  WBC 3.5* 3.1* 4.8 3.2*  NEUTROABS 2.3 1.9  --   --   HGB 8.6* 8.7* 8.8* 9.0*  HCT 27.8* 26.9* 28.8* 28.7*  MCV 102.6* 99.6 104.3* 102.5*  PLT 131* 119* 141* 114*    LFT Recent Labs  Lab 08/08/24 0936 08/11/24 0344 08/12/24 0829 08/14/24 0230  AST  --  10*  --   --   ALT  --  11  --   --   ALKPHOS  --  73  --   --   BILITOT  --  0.7  --   --   PROT  --  5.8*  --   --   ALBUMIN  2.9* 3.0* 3.1* 2.9*     Antibiotics: Anti-infectives (From admission, onward)    Start     Dose/Rate  Route Frequency Ordered Stop   07/08/24 1530  fluconazole  (DIFLUCAN ) tablet 150 mg        150 mg Oral  Once 07/08/24 1437 07/08/24 1544   06/18/24 2030  ceFAZolin  (ANCEF ) IVPB 2g/100 mL premix  Status:  Discontinued        2 g 200 mL/hr over 30 Minutes Intravenous Every 8 hours 06/18/24 1454 06/19/24 1608   06/18/24 0600  ceFAZolin  (ANCEF ) IVPB 2g/100 mL premix        2 g 200 mL/hr over 30 Minutes Intravenous On call to O.R. 06/15/24 1152 06/18/24 1230   06/06/24 1400  piperacillin -tazobactam (  ZOSYN ) IVPB 2.25 g        2.25 g 100 mL/hr over 30 Minutes Intravenous Every 8 hours 06/06/24 1017 06/09/24 0538   06/06/24 1200  vancomycin  (VANCOREADY) IVPB 500 mg/100 mL        500 mg 100 mL/hr over 60 Minutes Intravenous Every M-W-F (Hemodialysis) 06/04/24 1745 06/06/24 1900   06/04/24 1845  vancomycin  (VANCOCIN ) IVPB 1000 mg/200 mL premix        1,000 mg 200 mL/hr over 60 Minutes Intravenous  Once 06/04/24 1745 06/04/24 1915   06/04/24 1830  ceFEPIme  (MAXIPIME ) 2 g in sodium chloride  0.9 % 100 mL IVPB  Status:  Discontinued        2 g 200 mL/hr over 30 Minutes Intravenous Every M-W-F (Hemodialysis) 06/04/24 1743 06/06/24 1017   05/27/24 1015  cefTRIAXone  (ROCEPHIN ) 2 g in sodium chloride  0.9 % 100 mL IVPB        2 g 200 mL/hr over 30 Minutes Intravenous Every 24 hours 05/27/24 0918 05/29/24 1014   05/23/24 1530  ceFEPIme  (MAXIPIME ) 1 g in sodium chloride  0.9 % 100 mL IVPB  Status:  Discontinued        1 g 200 mL/hr over 30 Minutes Intravenous Every 24 hours 05/23/24 1433 05/27/24 0918   05/22/24 2200  Ampicillin -Sulbactam (UNASYN ) 3 g in sodium chloride  0.9 % 100 mL IVPB  Status:  Discontinued        3 g 200 mL/hr over 30 Minutes Intravenous Every 24 hours 05/22/24 0834 05/23/24 1433   05/22/24 0930  Ampicillin -Sulbactam (UNASYN ) 3 g in sodium chloride  0.9 % 100 mL IVPB        3 g 200 mL/hr over 30 Minutes Intravenous  Once 05/22/24 0834 05/22/24 1900        CONSULTS neurosurgery,  palliative care  Code Status: Full code  Family Communication:      Subjective   Denies any complaints  Objective    Physical Examination:  General-appears in no acute distress Heart-S1-S2, regular, no murmur auscultated Lungs-clear to auscultation bilaterally, no wheezing or crackles auscultated Abdomen-soft, nontender, no organomegaly Extremities-no edema in the lower extremities Neuro-alert, oriented x3, no focal deficit noted  [name] [name] Sidra Oldfield   [name] If 7PM-7AM, please contact night-coverage at [email or web address], Office  [phone]   08/14/2024, 1:58 PM  LOS: 87 days

## 2024-08-14 NOTE — Progress Notes (Signed)
 1.4 liters ultrafiltration, condition stable, and report was given to the primary RN.  This patient signed an AMA form with 1 hour 10 minutes remaining with hemodialysis.

## 2024-08-14 NOTE — Progress Notes (Signed)
 [facility] Progress Note   Subjective:  Seen in dialysis unit. Comfortable this am. No new events   Objective Vitals:   08/14/24 0814 08/14/24 0829 08/14/24 0839 08/14/24 0900  BP: (!) 169/73 (!) 173/72 (!) 166/67 (!) 170/77  Pulse: 85 88 84 87  Resp:  (!) 23 (!) 21 (!) 22  Temp:  98 F (36.7 C)    TempSrc:      SpO2:  100% 100% 99%  Weight:      Height:       Physical Exam General: alert female in NAD Heart: RRR Lungs: CTAB, nml WOB on RA Abdomen: soft, NTND Extremities: no LE edema Dialysis Access: LU AVF   Filed Weights   08/12/24 0835 08/12/24 1159 08/12/24 1211  Weight: 57 kg 56.4 kg (P) 53.9 kg   No intake or output data in the 24 hours ending 08/14/24 0921   Additional Objective Labs: Basic Metabolic Panel: Recent Labs  Lab 08/11/24 0344 08/12/24 0829 08/13/24 0157 08/14/24 0230  NA 137 135 134* 137  K 4.7 5.3* 4.8 5.4*  CL 96* 96* 95* 98  CO2 27 23 28 25   GLUCOSE 86 151* 112* 107*  BUN 55* 77* 34* 55*  CREATININE 5.85* 7.18* 3.84* 4.96*  CALCIUM  9.9 9.9 9.2 9.4  PHOS 6.1* 6.4*  --  6.2*   Liver Function Tests: Recent Labs  Lab 08/11/24 0344 08/12/24 0829 08/14/24 0230  AST 10*  --   --   ALT 11  --   --   ALKPHOS 73  --   --   BILITOT 0.7  --   --   PROT 5.8*  --   --   ALBUMIN  3.0* 3.1* 2.9*   CBC: Recent Labs  Lab 08/08/24 0935 08/11/24 0344 08/12/24 0829 08/14/24 0230  WBC 3.5* 3.1* 4.8 3.2*  NEUTROABS 2.3 1.9  --   --   HGB 8.6* 8.7* 8.8* 9.0*  HCT 27.8* 26.9* 28.8* 28.7*  MCV 102.6* 99.6 104.3* 102.5*  PLT 131* 119* 141* 114*   Blood Culture    Component Value Date/Time   SDES CSF 06/11/2024 0920   SPECREQUEST LP 06/11/2024 0920   CULT  06/11/2024 0920    NO GROWTH 3 DAYS Performed at [facility], [address], [place], [name] [address]    REPTSTATUS 06/14/2024 FINAL 06/11/2024 0920   CBG: Recent Labs  Lab 08/10/24 0311 08/10/24 0827 08/10/24 1148 08/10/24 1753 08/12/24 1338   GLUCAP 85 101* 161* 124* 143*   Iron  Studies:  No results for input(s): IRON , TIBC, TRANSFERRIN, FERRITIN in the last 72 hours.  Lab Results  Component Value Date   INR 1.1 06/18/2024   INR 1.1 06/16/2024   INR 1.1 05/20/2024   Studies/Results: No results found.  Medications:  anticoagulant sodium citrate        amLODipine   10 mg Oral Daily   carvedilol   25 mg Oral BID WC   Chlorhexidine  Gluconate Cloth  6 each Topical Q0600   cinacalcet   30 mg Oral Q M,W,F   cycloSPORINE  modified  200 mg Oral QPM   cycloSPORINE  modified  225 mg Oral Daily   darbepoetin (ARANESP ) injection - DIALYSIS  200 mcg Subcutaneous Q Thu-1800   diclofenac  Sodium  2 g Topical QID   famotidine   10 mg Oral QHS   gabapentin   100 mg Oral Daily   hydrALAZINE   25 mg Oral Q8H    HYDROmorphone  (DILAUDID ) injection  0.25 mg Intravenous Once  lacosamide   100 mg Oral BID   losartan   25 mg Oral Q1200   multivitamin  1 tablet Oral QHS   mycophenolate   500 mg Oral BID   pantoprazole   40 mg Oral Daily   predniSONE   15 mg Oral Q breakfast   sevelamer  carbonate  800 mg Oral TID WC    Dialysis Orders: 3:45hr, 400/A1.5, EDW 55.2kg, 2K/2Ca bath, AVF, no heparin  - Prev on Mircera 150mcg IV q 2 weeks - No VDRA   Assessment/Plan: Acute R intracranial hemorrhage: 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 Needs ongoing rehab. Doing a lot better.  ESRD:  HD on MWF.  HD off schedule this week. Next HD Thurs.  HTN/volume: BP somewhat labile - elevated again today.  Appearing euvolemic. Cont current meds - carvedilol  25mg  BID, hydralazine  25mg  TID, losartan  .  Amlodipine  10 mg daily. Continue titrate meds.   UF with HD  Anemia of ESRD: Hgb 8s > 9s, transfuse prn. Continue Aranesp  200 q Thurs. 12/6 iron  sat 31%.  Secondary HPTH: CorrCa high  likely due to immobility. Phos remains elevated. Will add Renvela .  Continue sensipar , not on VDRA. Pth 126 on 08/07/24.  Nutrition: Alb low, supp per RD. Off tube feeds.  Hx  pancreas/kidney transplant: Remains on IS for pancreas function. Dispo: Insurance declined LTAC, and SNF, appeal pending. Tolerated HD in recliner on multiple days  [name] Ronnald Acosta PA-C [facility] 08/14/2024,9:21 AM

## 2024-08-14 NOTE — Plan of Care (Signed)
°  Problem: Education: Goal: Knowledge of disease or condition will improve Outcome: Progressing Goal: Knowledge of secondary prevention will improve (MUST DOCUMENT ALL) Outcome: Progressing Goal: Knowledge of patient specific risk factors will improve (DELETE if not current risk factor) Outcome: Progressing   Problem: Self-Care: Goal: Ability to participate in self-care as condition permits will improve Outcome: Progressing   Problem: Education: Goal: Knowledge of General Education information will improve Description: Including pain rating scale, medication(s)/side effects and non-pharmacologic comfort measures Outcome: Progressing   Problem: Health Behavior/Discharge Planning: Goal: Ability to manage health-related needs will improve Outcome: Progressing   Problem: Clinical Measurements: Goal: Ability to maintain clinical measurements within normal limits will improve Outcome: Progressing Goal: Will remain free from infection Outcome: Progressing Goal: Diagnostic test results will improve Outcome: Progressing Goal: Respiratory complications will improve Outcome: Progressing Goal: Cardiovascular complication will be avoided Outcome: Progressing   Problem: Activity: Goal: Risk for activity intolerance will decrease Outcome: Progressing   Problem: Nutrition: Goal: Adequate nutrition will be maintained Outcome: Progressing   Problem: Safety: Goal: Ability to remain free from injury will improve Outcome: Progressing   Problem: Skin Integrity: Goal: Risk for impaired skin integrity will decrease Outcome: Progressing

## 2024-08-15 MED ORDER — CHLORHEXIDINE GLUCONATE CLOTH 2 % EX PADS
6.0000 | MEDICATED_PAD | Freq: Every day | CUTANEOUS | Status: DC
  Administered 2024-08-15 – 2024-08-18 (×3): 6 via TOPICAL

## 2024-08-15 MED ORDER — LOSARTAN POTASSIUM 50 MG PO TABS
50.0000 mg | ORAL_TABLET | Freq: Every day | ORAL | Status: DC
  Administered 2024-08-15 – 2024-08-18 (×4): 50 mg via ORAL
  Filled 2024-08-15 (×4): qty 1

## 2024-08-15 MED ORDER — HYDRALAZINE HCL 25 MG PO TABS
25.0000 mg | ORAL_TABLET | Freq: Four times a day (QID) | ORAL | Status: DC
  Administered 2024-08-15 – 2024-08-18 (×11): 25 mg via ORAL
  Filled 2024-08-15 (×11): qty 1

## 2024-08-15 NOTE — Progress Notes (Signed)
 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  Patient was noted to have hemorrhagic stroke.  Neurology, neurosurgery in critical care were consulted.  EVD was placed on the left side and was initially admitted to neuro ICU.  She was also intubated and had prolonged intubation.  Subsequently, patient was extubated on 05/27/2024.   Important events:  9/15 admitted with ICH plus IVH, EVD was placed for hydrocephalus, intubated 9/22 palliative care meeting.  Family is insistent that they want full scope of care 9/23 Extubated 10/1 -developed fevers pancultures drawn and broad-spectrum antibiotics started 10/2 became afebrile after starting antibiotics, did not tolerate raising EVD 10/6 EVD was raised from 10 to 30 cm water , tolerating well, remained afebrile 10/7 EVD is at 30 cm water , CT scan will be done tomorrow morning, no overnight issues 10/8 she became somnolent, CT head showed increasing hydrocephalus, EVD was titrated down to 10 cm of water  with improvement in mental status 10/15 VP shunt placement 10/17 transferred to progressive unit with [name]  11/18-medically stable.  Awaiting for disposition.  Insurance declined LTAC and skilled nursing facility, appeal pending. 12/3-tube feeding changed to nocturnal tube feeds, started on p.o. diet 12/9-nocturnal tube feeds stopped, started on p.o. diet    Assessment/Plan:   Acute right parietotemporal intraparenchymal hemorrhage / Intraventricular hemorrhage: Uncontrolled hypertension: Obstructive hydrocephalus s/p EVD changed to VP  shunt: Seizure disorder S/p VP shunt placement on 06/18/2024 by Dr. Mavis.  Continue Vimpat .   Neurosurgery intermittently followed the patient during hospitalization.   Currently stable   ESRD on dialysis: History of failed renal transplant: Secondary hyperparathyroidism: Nephrology on board.  Continue CellCept  and prednisone .   On Monday, Wednesday, Friday hemodialysis schedule.   TOC is working on finding outpatient hemodialysis set up  Hyponatremia Chronic, nephrology following Patient on hemodialysis    History of pancreatic/kidney transplantation:  Continue cyclosporine , mycophenolate , and prednisone .   Hypertension:  Blood pressure is mildly elevated Continue Coreg  25 mg p.o. twice daily, amlodipine  10 mg daily Change hydralazine  to 25 mg p.o. every 6 hours, increase losartan  to 50 mg daily   Dizziness resolved   Anemia of chronic disease:  Latest hemoglobin  8.5.. Transfuse if Hb below 7.0   Type 1 diabetes with vasculopathy/gastroparesis:  Not on long-term insulin  anymore due to pancreatic transplant.  She is currently n.p.o. for swallow evaluation/MBS Check CBG every 8 hours   Moderate protein calorie malnutrition/dysphagia:  Status post PEG tube feeding - Tube feeding has been stopped - Tolerating p.o. diet well     Debility/deconditioning/disposition/goals of care:  Palliative care had seen the patient during hospitalization.  Family has insisted full code.   Palliative care has signed off 10/17.  Disposition pending.  TOC on board.  Patient has been seen by physical therapy during hospitalization and patient requiring significant help      DVT prophylaxis: SCDs  Medications     amLODipine   10 mg Oral Daily   carvedilol   25 mg Oral BID WC   Chlorhexidine  Gluconate Cloth  6 each Topical Q0600   Chlorhexidine  Gluconate Cloth  6 each Topical Q0600   cinacalcet   30 mg Oral Q  M,W,F   cycloSPORINE  modified  200 mg Oral QPM   cycloSPORINE   modified  225 mg Oral Daily   darbepoetin (ARANESP ) injection - DIALYSIS  200 mcg Subcutaneous Q Thu-1800   diclofenac  Sodium  2 g Topical QID   famotidine   10 mg Oral QHS   gabapentin   100 mg Oral Daily   hydrALAZINE   25 mg Oral Q6H    HYDROmorphone  (DILAUDID ) injection  0.25 mg Intravenous Once   lacosamide   100 mg Oral BID   losartan   50 mg Oral Q1200   multivitamin  1 tablet Oral QHS   mycophenolate   500 mg Oral BID   pantoprazole   40 mg Oral Daily   predniSONE   15 mg Oral Q breakfast   sevelamer  carbonate  800 mg Oral TID WC     Data Reviewed:   CBG:  Recent Labs  Lab 08/10/24 0311 08/10/24 0827 08/10/24 1148 08/10/24 1753 08/12/24 1338  GLUCAP 85 101* 161* 124* 143*    SpO2: 100 % O2 Flow Rate (L/min): 100 L/min FiO2 (%): 40 %    Vitals:   08/15/24 0712 08/15/24 0822 08/15/24 1000 08/15/24 1117  BP: (!) 179/79 (!) 168/98 (!) 148/73 (!) 156/77  Pulse: 89 95 86 86  Resp: 15   14  Temp: 98.7 F (37.1 C)   97.6 F (36.4 C)  TempSrc: Oral   Oral  SpO2: 96%   100%  Weight:      Height:          Data Reviewed:  Basic Metabolic Panel: Recent Labs  Lab 08/11/24 0344 08/12/24 0829 08/13/24 0157 08/14/24 0230  NA 137 135 134* 137  K 4.7 5.3* 4.8 5.4*  CL 96* 96* 95* 98  CO2 27 23 28 25   GLUCOSE 86 151* 112* 107*  BUN 55* 77* 34* 55*  CREATININE 5.85* 7.18* 3.84* 4.96*  CALCIUM  9.9 9.9 9.2 9.4  MG 2.9*  --   --   --   PHOS 6.1* 6.4*  --  6.2*    CBC: Recent Labs  Lab 08/11/24 0344 08/12/24 0829 08/14/24 0230  WBC 3.1* 4.8 3.2*  NEUTROABS 1.9  --   --   HGB 8.7* 8.8* 9.0*  HCT 26.9* 28.8* 28.7*  MCV 99.6 104.3* 102.5*  PLT 119* 141* 114*    LFT Recent Labs  Lab 08/11/24 0344 08/12/24 0829 08/14/24 0230  AST 10*  --   --   ALT 11  --   --   ALKPHOS 73  --   --   BILITOT 0.7  --   --   PROT 5.8*  --   --   ALBUMIN  3.0* 3.1* 2.9*     Antibiotics: Anti-infectives (From admission, onward)    Start     Dose/Rate Route Frequency  Ordered Stop   07/08/24 1530  fluconazole  (DIFLUCAN ) tablet 150 mg        150 mg Oral  Once 07/08/24 1437 07/08/24 1544   06/18/24 2030  ceFAZolin  (ANCEF ) IVPB 2g/100 mL premix  Status:  Discontinued        2 g 200 mL/hr over 30 Minutes Intravenous Every 8 hours 06/18/24 1454 06/19/24 1608   06/18/24 0600  ceFAZolin  (ANCEF ) IVPB 2g/100 mL premix        2 g 200 mL/hr over 30 Minutes Intravenous On call to O.R. 06/15/24 1152 06/18/24 1230   06/06/24 1400  piperacillin -tazobactam (ZOSYN ) IVPB 2.25 g        2.25 g 100 mL/hr over  30 Minutes Intravenous Every 8 hours 06/06/24 1017 06/09/24 0538   06/06/24 1200  vancomycin  (VANCOREADY) IVPB 500 mg/100 mL        500 mg 100 mL/hr over 60 Minutes Intravenous Every M-W-F (Hemodialysis) 06/04/24 1745 06/06/24 1900   06/04/24 1845  vancomycin  (VANCOCIN ) IVPB 1000 mg/200 mL premix        1,000 mg 200 mL/hr over 60 Minutes Intravenous  Once 06/04/24 1745 06/04/24 1915   06/04/24 1830  ceFEPIme  (MAXIPIME ) 2 g in sodium chloride  0.9 % 100 mL IVPB  Status:  Discontinued        2 g 200 mL/hr over 30 Minutes Intravenous Every M-W-F (Hemodialysis) 06/04/24 1743 06/06/24 1017   05/27/24 1015  cefTRIAXone  (ROCEPHIN ) 2 g in sodium chloride  0.9 % 100 mL IVPB        2 g 200 mL/hr over 30 Minutes Intravenous Every 24 hours 05/27/24 0918 05/29/24 1014   05/23/24 1530  ceFEPIme  (MAXIPIME ) 1 g in sodium chloride  0.9 % 100 mL IVPB  Status:  Discontinued        1 g 200 mL/hr over 30 Minutes Intravenous Every 24 hours 05/23/24 1433 05/27/24 0918   05/22/24 2200  Ampicillin -Sulbactam (UNASYN ) 3 g in sodium chloride  0.9 % 100 mL IVPB  Status:  Discontinued        3 g 200 mL/hr over 30 Minutes Intravenous Every 24 hours 05/22/24 0834 05/23/24 1433   05/22/24 0930  Ampicillin -Sulbactam (UNASYN ) 3 g in sodium chloride  0.9 % 100 mL IVPB        3 g 200 mL/hr over 30 Minutes Intravenous  Once 05/22/24 0834 05/22/24 1900        CONSULTS neurosurgery, palliative  care  Code Status: Full code  Family Communication:      Subjective   Denies any complaints.  Eating well.  Tube feeding has been stopped  Objective    Physical Examination:  General-appears in no acute distress Heart-S1-S2, regular, no murmur auscultated Lungs-clear to auscultation bilaterally, no wheezing or crackles auscultated Abdomen-soft, nontender, no organomegaly Extremities-no edema in the lower extremities Neuro-alert, oriented x3, no focal deficit noted  [name] [name] S Eunique Balik   [name] If 7PM-7AM, please contact night-coverage at [email or web address], Office  [phone]   08/15/2024, 4:22 PM  LOS: 88 days

## 2024-08-15 NOTE — Progress Notes (Signed)
 Inpatient Rehab Admissions Coordinator:  Consult received. Saw pt and mother at bedside. Discussed pt's progress with therapy. Pt able to ambulate 165' CGA and requiring Supervision-Min A with ADLs. Pt and pt's mom acknowledged that pt is doing well with mobility. Discussed all rehab options. Pt would like to receive HH therapy. Pt's mom has medical concerns that she would like to discuss with the MD piror to feeling comfortable with pt discharging home. Pt's mom indicated that she would be in agreement with [facility] therapy as long as medical issues were stable. TOC made aware.   Tinnie Yvone Cohens, MS, CCC-SLP Admissions Coordinator [phone]

## 2024-08-15 NOTE — Progress Notes (Signed)
 Occupational Therapy Treatment Patient Details Name: [name] [name] MRN: [id number] DOB: 06-27-1981 Today's Date: 08/15/2024   History of present illness 43 yo female presents 05/19/24 for left gaze and L side weakness. 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 Intubated 9/15-9/23. EVD 9/15- 10/15; s/p shunt 10/15. S/p laparoscopy with intra-abdominal assistance ventricular-peritoneal shunt placement 10/15. PMH: HTN, ESRD on HD s/p renal and pancreatic transplants, DM, gastroparesis, multiple strokes (ischemic and hemorrhagic) with prepontine SAH, hydrocephalus requiring EVD placement   OT comments  Patient demonstrating good gains with OT treatment with bed mobility, self care and transfers. Patient performed mobility in hallway with rollator walker with education on brakes and safety.  Patient's mother assisted patient with mobility and education provided and assistance to provide and cues to assist.  Acute OT to continue to follow to address established goals.       If plan is discharge home, recommend the following:  Assistance with cooking/housework;Assist for transportation;Help with stairs or ramp for entrance;A little help with walking and/or transfers;Direct supervision/assist for medications management;Direct supervision/assist for financial management;Supervision due to cognitive status;A little help with bathing/dressing/bathroom   Equipment Recommendations  BSC/3in1    Recommendations for Other Services      Precautions / Restrictions Precautions Precautions: Fall Recall of Precautions/Restrictions: Impaired Precaution/Restrictions Comments: PEG Restrictions Weight Bearing Restrictions Per Provider Order: No       Mobility Bed Mobility Overal bed mobility: Needs Assistance Bed Mobility: Sit to Supine     Supine to sit: Contact guard, Used  rails     General bed mobility comments: CGA for trunk    Transfers Overall transfer level: Needs assistance Equipment used: Rollator (4 wheels) Transfers: Sit to/from Stand Sit to Stand: Contact guard assist, Min assist           General transfer comment: education on rollator brakes and safety with min/CGA for sit to stands and transfers due to unfamiliar with rollator     Balance Overall balance assessment: Needs assistance Sitting-balance support: No upper extremity supported, Feet supported Sitting balance-Leahy Scale: Good Sitting balance - Comments: EOB   Standing balance support: Bilateral upper extremity supported, During functional activity, Reliant on assistive device for balance Standing balance-Leahy Scale: Poor Standing balance comment: able to stand at sink with no UE support but leaned on sink for support                           ADL either performed or assessed with clinical judgement   ADL Overall ADL's : Needs assistance/impaired     Grooming: Contact guard assist;Wash/dry hands;Standing Grooming Details (indicate cue type and reason): at sink         Upper Body Dressing : Minimal assistance;Sitting Upper Body Dressing Details (indicate cue type and reason): gown to cover back Lower Body Dressing: Supervision/safety;Sitting/lateral leans Lower Body Dressing Details (indicate cue type and reason): to donn socks seated on EOB             Functional mobility during ADLs: Contact guard assist;Rollator (4 wheels)      Extremity/Trunk Assessment              Vision       Perception     Praxis     Communication Communication Communication: No apparent difficulties   Cognition Arousal: Alert Behavior During Therapy: WFL for tasks assessed/performed Cognition: Cognition  impaired     Awareness: Intellectual awareness impaired Memory impairment (select all impairments): Short-term memory, Working memory Attention  impairment (select first level of impairment): Sustained attention Executive functioning impairment (select all impairments): Initiation, Organization, Sequencing, Problem solving OT - Cognition Comments: cognitively improving                 Following commands: Impaired Following commands impaired: Follows one step commands with increased time, Follows multi-step commands inconsistently      Cueing   Cueing Techniques: Verbal cues, Gestural cues  Exercises      Shoulder Instructions       General Comments mobility performed with rollator this date with patient's mother provided education on use and how to guard patient and cues to provide    Pertinent Vitals/ Pain       Pain Assessment Pain Assessment: Faces Faces Pain Scale: No hurt Pain Intervention(s): Monitored during session  Home Living                                          Prior Functioning/Environment              Frequency  Min 2X/week        Progress Toward Goals  OT Goals(current goals can now be found in the care plan section)  Progress towards OT goals: Progressing toward goals  Acute Rehab OT Goals Patient Stated Goal: to go home, per mother OT Goal Formulation: With patient/family Time For Goal Achievement: 08/20/24 Potential to Achieve Goals: Good ADL Goals Pt Will Perform Grooming: with supervision;standing Pt Will Perform Lower Body Dressing: with supervision;sit to/from stand Pt Will Transfer to Toilet: with contact guard assist;ambulating;regular height toilet;grab bars Pt/caregiver will Perform Home Exercise Program: Both right and left upper extremity;With written HEP provided;Increased strength Additional ADL Goal #1: Pt will follow 2-step commands 75% of the time during ADLs and functional mobility. Additional ADL Goal #2: Pt will sustain attention to ADL task for ~5 minutes with min cues.  Plan      Co-evaluation                 AM-PAC OT 6  Clicks Daily Activity     Outcome Measure   Help from another person eating meals?: A Little Help from another person taking care of personal grooming?: A Little Help from another person toileting, which includes using toliet, bedpan, or urinal?: A Little Help from another person bathing (including washing, rinsing, drying)?: A Lot Help from another person to put on and taking off regular upper body clothing?: A Little Help from another person to put on and taking off regular lower body clothing?: A Little 6 Click Score: 17    End of Session Equipment Utilized During Treatment: Gait belt;Rollator (4 wheels)  OT Visit Diagnosis: Unsteadiness on feet (R26.81);Muscle weakness (generalized) (M62.81);History of falling (Z91.81);Other symptoms and signs involving cognitive function;Cognitive communication deficit (R41.841);Low vision, both eyes (H54.2) Symptoms and signs involving cognitive functions: Nontraumatic SAH   Activity Tolerance Patient tolerated treatment well   Patient Left in chair;with call bell/phone within reach;with chair alarm set;with family/visitor present   Nurse Communication Mobility status        Time: 9078-9046 OT Time Calculation (min): 32 min  Charges: OT General Charges $OT Visit: 1 Visit OT Treatments $Self Care/Home Management : 8-22 mins $Therapeutic Activity: 8-22 mins  [name], OTA Acute Rehabilitation  Services  Office [phone]   [name] 08/15/2024, 12:39 PM

## 2024-08-15 NOTE — Progress Notes (Signed)
 PT Cancellation Note  Patient Details Name: [name] [name] MRN: [id number] DOB: May 26, 1981   Cancelled Treatment:    Reason Eval/Treat Not Completed: Pain limiting ability to participate (Pt declined stating that she was having a back spasm following OT session. Requests PT to come back later.)   [name] [name] 08/15/2024, 2:19 PM

## 2024-08-15 NOTE — TOC Progression Note (Addendum)
 Transition of Care [facility]) - Progression Note    Patient Details  Name: [name] [name] MRN: [id number] Date of Birth: May 03, 1981  [facility] CM/SW Contact  [name] [name], [name] Phone Number: 08/15/2024, 12:54 PM  Clinical Narrative:   Patient with no offers for SNF at this time. CSW noting per chart review patient with improvements. CSW coordinated with acute rehab team about possibility of STAR program or CIR consult. CIR to review and place consult. CSW to follow.  UPDATE: CSW updated by Rehab Admissions that patient's mother is feeling more comfortable with HH with patient's improvements. CSW met with mother at bedside to discuss. Mom is very encouraged with the patient's progress, and does feel that patient would likely do better at home than at SNF with the care that mom can provide. Mom has continued medical concerns, she said she spoke with MD about blood pressure this morning and he is adjusting medication. Mom is also wondering about what equipment patient will need, and to schedule HH visits around HD. Mom also asking about transportation for patient to HD appointments. CSW discussed reaching out to PT and OT for DME recommendations for discharge home, as well as finding a HH company. Referral sent out in the hub, awaiting responses. CSW to follow.    Expected Discharge Plan: Skilled Nursing Facility Barriers to Discharge: No SNF bed, Insurance Authorization               Expected Discharge Plan and Services In-house Referral: Clinical Social Work   Post Acute Care Choice: Skilled Nursing Facility Living arrangements for the past 2 months: Apartment                                       Social Drivers of Health (SDOH) Interventions SDOH Screenings   Food Insecurity: Patient Unable To Answer (05/20/2024)  Housing: Unknown (07/14/2024)  Transportation Needs: Patient Unable To Answer (05/20/2024)  Utilities: Patient Unable To Answer  (05/20/2024)  Social Connections: Unknown (10/04/2022)   Received from [facility]  Stress: No Stress Concern Present (04/16/2024)   Received from [facility]  Tobacco Use: Medium Risk (06/18/2024)    Readmission Risk Interventions    05/20/2024    4:16 PM 03/31/2024   11:27 AM  Readmission Risk Prevention Plan  Transportation Screening Complete Complete  Medication Review Oceanographer) Complete Complete  PCP or Specialist appointment within 3-5 days of discharge Complete   HRI or Home Care Consult Complete Complete  SW Recovery Care/Counseling Consult Complete Complete  Palliative Care Screening Complete Not Applicable  Skilled Nursing Facility Complete Not Applicable

## 2024-08-15 NOTE — Progress Notes (Signed)
° °  Inpatient Rehab Admissions Coordinator :  Per therapy supervisor request, Margie Moton,  patient was screened for CIR candidacy by Heron Leavell RN MSN. Patient hospitalized 02/13/24 and discharged to [facility] 03/31/24 until 04/16/24.. Admitted to [facility] until admitted 05/19/24.  Noted we consulted 06/23/24 Dr Carilyn and at that time [facility] was recommended. LTACH ws denied and SNF was pursued. SNF initially denied and won appeal but SNF bed not obtained. Due to her progress, therapy recommending CIR prior with goal to return home with Mom currently at bedside. Our goal would be to return home with Mom assisting as her caregiver, not to [place] out [place] with Mom or SNF long term. Patient in need of OP hemodialysis. Mom's ability to remain long term will need to be clarified.  At this time patient appears to be a potential candidate for CIR. I will place a rehab consult per protocol for full assessment. Please call me with any questions.  Heron Leavell RN MSN Admissions Coordinator [phone]

## 2024-08-15 NOTE — Progress Notes (Signed)
 [facility] Progress Note   Subjective:   Completed HD yesterday with 1.4L UF but signed off early. Up walking with therapy. Reports feeling well, denies SOB, CP, dizziness.   Objective Vitals:   08/14/24 1951 08/14/24 2153 08/15/24 0546 08/15/24 0712  BP: (!) 148/75 138/71 (!) 178/78 (!) 179/79  Pulse: 89  88 89  Resp: 17  16 15   Temp: 97.8 F (36.6 C) 98.4 F (36.9 C) 98.9 F (37.2 C) 98.7 F (37.1 C)  TempSrc: Oral Oral Oral Oral  SpO2: 100%  100% 96%  Weight:      Height:       Physical Exam  General: alert female in NAD Heart: RRR Lungs: CTAB, nml WOB on RA Abdomen: soft, NTND Extremities: no LE edema Dialysis Access: LU AVF   Additional Objective Labs: Basic Metabolic Panel: Recent Labs  Lab 08/11/24 0344 08/12/24 0829 08/13/24 0157 08/14/24 0230  NA 137 135 134* 137  K 4.7 5.3* 4.8 5.4*  CL 96* 96* 95* 98  CO2 27 23 28 25   GLUCOSE 86 151* 112* 107*  BUN 55* 77* 34* 55*  CREATININE 5.85* 7.18* 3.84* 4.96*  CALCIUM  9.9 9.9 9.2 9.4  PHOS 6.1* 6.4*  --  6.2*   Liver Function Tests: Recent Labs  Lab 08/11/24 0344 08/12/24 0829 08/14/24 0230  AST 10*  --   --   ALT 11  --   --   ALKPHOS 73  --   --   BILITOT 0.7  --   --   PROT 5.8*  --   --   ALBUMIN  3.0* 3.1* 2.9*   No results for input(s): LIPASE, AMYLASE in the last 168 hours. CBC: Recent Labs  Lab 08/08/24 0935 08/11/24 0344 08/12/24 0829 08/14/24 0230  WBC 3.5* 3.1* 4.8 3.2*  NEUTROABS 2.3 1.9  --   --   HGB 8.6* 8.7* 8.8* 9.0*  HCT 27.8* 26.9* 28.8* 28.7*  MCV 102.6* 99.6 104.3* 102.5*  PLT 131* 119* 141* 114*   Blood Culture    Component Value Date/Time   SDES CSF 06/11/2024 0920   SPECREQUEST LP 06/11/2024 0920   CULT  06/11/2024 0920    NO GROWTH 3 DAYS Performed at [facility], [address] [place], [name] [address]    REPTSTATUS 06/14/2024 FINAL 06/11/2024 0920    Cardiac Enzymes: No results for input(s): CKTOTAL, CKMB,  CKMBINDEX, TROPONINI in the last 168 hours. CBG: Recent Labs  Lab 08/10/24 0311 08/10/24 0827 08/10/24 1148 08/10/24 1753 08/12/24 1338  GLUCAP 85 101* 161* 124* 143*   Iron  Studies: No results for input(s): IRON , TIBC, TRANSFERRIN, FERRITIN in the last 72 hours. @lablastinr3 @ Studies/Results: No results found. Medications:   amLODipine   10 mg Oral Daily   carvedilol   25 mg Oral BID WC   Chlorhexidine  Gluconate Cloth  6 each Topical Q0600   cinacalcet   30 mg Oral Q M,W,F   cycloSPORINE  modified  200 mg Oral QPM   cycloSPORINE  modified  225 mg Oral Daily   darbepoetin (ARANESP ) injection - DIALYSIS  200 mcg Subcutaneous Q Thu-1800   diclofenac  Sodium  2 g Topical QID   famotidine   10 mg Oral QHS   gabapentin   100 mg Oral Daily   hydrALAZINE   25 mg Oral Q8H    HYDROmorphone  (DILAUDID ) injection  0.25 mg Intravenous Once   lacosamide   100 mg Oral BID   losartan   25 mg Oral Q1200   multivitamin  1 tablet Oral QHS  mycophenolate   500 mg Oral BID   pantoprazole   40 mg Oral Daily   predniSONE   15 mg Oral Q breakfast   sevelamer  carbonate  800 mg Oral TID WC    Dialysis Orders: 3:45hr, 400/A1.5, EDW 55.2kg, 2K/2Ca bath, AVF, no heparin  - Prev on Mircera 150mcg IV q 2 weeks - No VDRA  Assessment/Plan: Acute R intracranial hemorrhage: 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 Needs ongoing rehab. Doing a lot better.  ESRD:  HD on MWF.  HD off schedule this week, currently TTS and will resume regular schedule next wee.  HTN/volume: BP somewhat labile - elevated again today.  Appearing euvolemic. Cont current meds - carvedilol  25mg  BID, hydralazine  25mg  TID, losartan , Amlodipine  10 mg daily.  UF with HD. Will increase hydralazine  dose if BP remains elevated.  Anemia of ESRD: Hgb 8s > 9s, transfuse prn. Continue Aranesp  200 q Thurs. 12/6 iron  sat 31%.  Secondary HPTH: CorrCa high  likely due to immobility. Phos remains elevated, added Renvela .  Continue sensipar , not on  VDRA. Pth 126 on 08/07/24.  Nutrition: Alb low, supp per RD. Starting to increase PO intake. 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 Dispo: Insurance declined LTAC, and SNF, appeal pending. Tolerated HD in recliner on multiple days    Lucie Collet, PA-C 08/15/2024, 8:17 AM  [facility] Pager: [phone]

## 2024-08-15 NOTE — Plan of Care (Signed)
°  Problem: Education: Goal: Knowledge of disease or condition will improve Outcome: Not Progressing Goal: Knowledge of secondary prevention will improve (MUST DOCUMENT ALL) Outcome: Not Progressing Goal: Knowledge of patient specific risk factors will improve (DELETE if not current risk factor) Outcome: Not Progressing   Problem: Self-Care: Goal: Ability to participate in self-care as condition permits will improve Outcome: Not Progressing   Problem: Education: Goal: Knowledge of General Education information will improve Description: Including pain rating scale, medication(s)/side effects and non-pharmacologic comfort measures Outcome: Not Progressing   Problem: Health Behavior/Discharge Planning: Goal: Ability to manage health-related needs will improve Outcome: Not Progressing   Problem: Nutrition: Goal: Adequate nutrition will be maintained Outcome: Not Progressing   Problem: Safety: Goal: Ability to remain free from injury will improve Outcome: Not Progressing   Problem: Skin Integrity: Goal: Risk for impaired skin integrity will decrease Outcome: Not Progressing

## 2024-08-16 DIAGNOSIS — I619 Nontraumatic intracerebral hemorrhage, unspecified: Secondary | ICD-10-CM | POA: Diagnosis not present

## 2024-08-16 DIAGNOSIS — I610 Nontraumatic intracerebral hemorrhage in hemisphere, subcortical: Secondary | ICD-10-CM | POA: Diagnosis not present

## 2024-08-16 DIAGNOSIS — I10 Essential (primary) hypertension: Secondary | ICD-10-CM | POA: Diagnosis not present

## 2024-08-16 DIAGNOSIS — E440 Moderate protein-calorie malnutrition: Secondary | ICD-10-CM | POA: Diagnosis not present

## 2024-08-16 LAB — RENAL FUNCTION PANEL
Albumin: 3 g/dL — ABNORMAL LOW (ref 3.5–5.0)
Anion gap: 16 — ABNORMAL HIGH (ref 5–15)
BUN: 60 mg/dL — ABNORMAL HIGH (ref 6–20)
CO2: 24 mmol/L (ref 22–32)
Calcium: 9.6 mg/dL (ref 8.9–10.3)
Chloride: 97 mmol/L — ABNORMAL LOW (ref 98–111)
Creatinine, Ser: 5.38 mg/dL — ABNORMAL HIGH (ref 0.44–1.00)
GFR, Estimated: 10 mL/min — ABNORMAL LOW (ref >60)
Glucose, Bld: 124 mg/dL — ABNORMAL HIGH (ref 70–99)
Phosphorus: 6.6 mg/dL — ABNORMAL HIGH (ref 2.5–4.6)
Potassium: 5.3 mmol/L — ABNORMAL HIGH (ref 3.5–5.1)
Sodium: 137 mmol/L (ref 135–145)

## 2024-08-16 LAB — CBC
HCT: 29.5 % — ABNORMAL LOW (ref 36.0–46.0)
Hemoglobin: 9 g/dL — ABNORMAL LOW (ref 12.0–15.0)
MCH: 31.9 pg (ref 26.0–34.0)
MCHC: 30.5 g/dL (ref 30.0–36.0)
MCV: 104.6 fL — ABNORMAL HIGH (ref 80.0–100.0)
Platelets: 129 K/uL — ABNORMAL LOW (ref 150–400)
RBC: 2.82 MIL/uL — ABNORMAL LOW (ref 3.87–5.11)
RDW: 14.3 % (ref 11.5–15.5)
WBC: 4.2 K/uL (ref 4.0–10.5)
nRBC: 0 % (ref 0.0–0.2)

## 2024-08-16 NOTE — Progress Notes (Signed)
 [facility] Progress Note   Subjective:   Seen on HD, alert and in good spirits. Denies SOB, CP, dizziness. Says her appetite has been great and she is eating more.   Objective Vitals:   08/16/24 0036 08/16/24 0510 08/16/24 0716 08/16/24 0811  BP: (!) 158/72 (!) 165/81 (!) 148/68 (!) 153/68  Pulse: 89 86 87 85  Resp: 14 16 16 15   Temp: 98.7 F (37.1 C) 98.2 F (36.8 C) 98 F (36.7 C) 97.9 F (36.6 C)  TempSrc: Oral Oral Oral   SpO2: 96% 93% 97% 99%  Weight:    57 kg  Height:       Physical Exam General: alert female in NAD Heart: RRR Lungs: CTAB, nml WOB on RA Abdomen: soft, NTND Extremities: no LE edema Dialysis Access: LU AVF      Additional Objective Labs: Basic Metabolic Panel: Recent Labs  Lab 08/11/24 0344 08/12/24 0829 08/13/24 0157 08/14/24 0230  NA 137 135 134* 137  K 4.7 5.3* 4.8 5.4*  CL 96* 96* 95* 98  CO2 27 23 28 25   GLUCOSE 86 151* 112* 107*  BUN 55* 77* 34* 55*  CREATININE 5.85* 7.18* 3.84* 4.96*  CALCIUM  9.9 9.9 9.2 9.4  PHOS 6.1* 6.4*  --  6.2*   Liver Function Tests: Recent Labs  Lab 08/11/24 0344 08/12/24 0829 08/14/24 0230  AST 10*  --   --   ALT 11  --   --   ALKPHOS 73  --   --   BILITOT 0.7  --   --   PROT 5.8*  --   --   ALBUMIN  3.0* 3.1* 2.9*   No results for input(s): LIPASE, AMYLASE in the last 168 hours. CBC: Recent Labs  Lab 08/11/24 0344 08/12/24 0829 08/14/24 0230  WBC 3.1* 4.8 3.2*  NEUTROABS 1.9  --   --   HGB 8.7* 8.8* 9.0*  HCT 26.9* 28.8* 28.7*  MCV 99.6 104.3* 102.5*  PLT 119* 141* 114*   Blood Culture    Component Value Date/Time   SDES CSF 06/11/2024 0920   SPECREQUEST LP 06/11/2024 0920   CULT  06/11/2024 0920    NO GROWTH 3 DAYS Performed at [facility], [address], [place], [name] [address]    REPTSTATUS 06/14/2024 FINAL 06/11/2024 0920    Cardiac Enzymes: No results for input(s): CKTOTAL, CKMB, CKMBINDEX, TROPONINI in the last 168  hours. CBG: Recent Labs  Lab 08/10/24 0311 08/10/24 0827 08/10/24 1148 08/10/24 1753 08/12/24 1338  GLUCAP 85 101* 161* 124* 143*   Iron  Studies: No results for input(s): IRON , TIBC, TRANSFERRIN, FERRITIN in the last 72 hours. @lablastinr3 @ Studies/Results: No results found. Medications:   amLODipine   10 mg Oral Daily   carvedilol   25 mg Oral BID WC   Chlorhexidine  Gluconate Cloth  6 each Topical Q0600   Chlorhexidine  Gluconate Cloth  6 each Topical Q0600   cinacalcet   30 mg Oral Q M,W,F   cycloSPORINE  modified  200 mg Oral QPM   cycloSPORINE  modified  225 mg Oral Daily   darbepoetin (ARANESP ) injection - DIALYSIS  200 mcg Subcutaneous Q Thu-1800   diclofenac  Sodium  2 g Topical QID   famotidine   10 mg Oral QHS   gabapentin   100 mg Oral Daily   hydrALAZINE   25 mg Oral Q6H    HYDROmorphone  (DILAUDID ) injection  0.25 mg Intravenous Once   lacosamide   100 mg Oral BID   losartan   50 mg Oral Q1200  multivitamin  1 tablet Oral QHS   mycophenolate   500 mg Oral BID   pantoprazole   40 mg Oral Daily   predniSONE   15 mg Oral Q breakfast   sevelamer  carbonate  800 mg Oral TID WC    Dialysis Orders: 3:45hr, 400/A1.5, EDW 55.2kg, 2K/2Ca bath, AVF, no heparin  - Prev on Mircera 150mcg IV q 2 weeks - No VDRA  Assessment/Plan: Acute R intracranial hemorrhage: 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 Needs ongoing rehab. Doing a lot better, memory and alertness much improved.  ESRD:  HD on MWF.  HD off schedule this week, currently TTS and will resume regular schedule next wee.  HTN/volume: BP somewhat labile - elevated again today.  Appearing euvolemic. Cont current meds - carvedilol  25mg  BID, hydralazine  25mg  TID, losartan , Amlodipine  10 mg daily.  UF with HD. Will increase hydralazine  dose if BP remains elevated.  Anemia of ESRD: Hgb 8s > 9s, transfuse prn. Continue Aranesp  200 q Thurs. 12/6 iron  sat 31%.  Secondary HPTH: CorrCa high  likely due to immobility. Phos remains  elevated, added Renvela .  Continue sensipar , not on VDRA. Pth 126 on 08/07/24.  Nutrition: Alb low, supp per RD. Starting to increase PO intake. 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 Dispo: Insurance declined LTAC, and SNF, appeal pending. Tolerated HD in recliner on multiple days  Lucie Collet, PA-C 08/16/2024, 8:22 AM  [facility] Pager: [phone]

## 2024-08-16 NOTE — Progress Notes (Signed)
 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  Patient was noted to have hemorrhagic stroke.  Neurology, neurosurgery in critical care were consulted.  EVD was placed on the left side and was initially admitted to neuro ICU.  She was also intubated and had prolonged intubation.  Subsequently, patient was extubated on 05/27/2024.   Important events:  9/15 admitted with ICH plus IVH, EVD was placed for hydrocephalus, intubated 9/22 palliative care meeting.  Family is insistent that they want full scope of care 9/23 Extubated 10/1 -developed fevers pancultures drawn and broad-spectrum antibiotics started 10/2 became afebrile after starting antibiotics, did not tolerate raising EVD 10/6 EVD was raised from 10 to 30 cm water , tolerating well, remained afebrile 10/7 EVD is at 30 cm water , CT scan will be done tomorrow morning, no overnight issues 10/8 she became somnolent, CT head showed increasing hydrocephalus, EVD was titrated down to 10 cm of water  with improvement in mental status 10/15 VP shunt placement 10/17 transferred to progressive unit with [name]  11/18-medically stable.  Awaiting for disposition.  Insurance declined LTAC and skilled nursing facility, appeal pending. 12/3-tube feeding changed to nocturnal tube feeds, started on p.o. diet 12/9-nocturnal tube feeds stopped, started on p.o. diet    Assessment/Plan:   Acute right parietotemporal intraparenchymal hemorrhage / Intraventricular hemorrhage: Uncontrolled hypertension: Obstructive hydrocephalus s/p EVD changed to VP  shunt: Seizure disorder S/p VP shunt placement on 06/18/2024 by Dr. Mavis.  Continue Vimpat .   Neurosurgery intermittently followed the patient during hospitalization.   Currently stable   ESRD on dialysis: History of failed renal transplant: Secondary hyperparathyroidism: Nephrology on board.  Continue CellCept  and prednisone .   On Monday, Wednesday, Friday hemodialysis schedule.   TOC is working on finding outpatient hemodialysis set up  Hyponatremia Chronic, nephrology following Patient on hemodialysis    History of pancreatic/kidney transplantation:  Continue cyclosporine , mycophenolate , and prednisone .   Hypertension:  Blood pressure is better controlled after increasing hydralazine  to 25 mg every 6 hours, and increasing losartan  to 50 mg daily Continue Coreg  25 mg p.o. twice daily, amlodipine  10 mg daily    Dizziness resolved   Anemia of chronic disease:  Latest hemoglobin  8.5.. Transfuse if Hb below 7.0   Type 1 diabetes with vasculopathy/gastroparesis:  Not on long-term insulin  anymore due to pancreatic transplant.  She is currently n.p.o. for swallow evaluation/MBS Check CBG every 8 hours   Moderate protein calorie malnutrition/dysphagia:  Status post PEG tube feeding - Tube feeding has been stopped - Tolerating p.o. diet well     Debility/deconditioning/disposition/goals of care:  Palliative care had seen the patient during hospitalization.  Family has insisted full code.   Palliative care has signed off 10/17.  Disposition pending.  TOC on board.  Patient has been seen by physical therapy during hospitalization and patient requiring significant help      DVT prophylaxis: SCDs  Medications     amLODipine   10 mg Oral Daily   carvedilol   25 mg Oral BID WC   Chlorhexidine  Gluconate Cloth  6 each Topical Q0600   Chlorhexidine  Gluconate Cloth  6 each Topical Q0600   cinacalcet   30 mg  Oral Q M,W,F   cycloSPORINE  modified  200 mg Oral QPM    cycloSPORINE  modified  225 mg Oral Daily   darbepoetin (ARANESP ) injection - DIALYSIS  200 mcg Subcutaneous Q Thu-1800   diclofenac  Sodium  2 g Topical QID   famotidine   10 mg Oral QHS   gabapentin   100 mg Oral Daily   hydrALAZINE   25 mg Oral Q6H    HYDROmorphone  (DILAUDID ) injection  0.25 mg Intravenous Once   lacosamide   100 mg Oral BID   losartan   50 mg Oral Q1200   multivitamin  1 tablet Oral QHS   mycophenolate   500 mg Oral BID   pantoprazole   40 mg Oral Daily   predniSONE   15 mg Oral Q breakfast   sevelamer  carbonate  800 mg Oral TID WC     Data Reviewed:   CBG:  Recent Labs  Lab 08/10/24 0311 08/10/24 0827 08/10/24 1148 08/10/24 1753 08/12/24 1338  GLUCAP 85 101* 161* 124* 143*    SpO2: 100 % O2 Flow Rate (L/min): 100 L/min FiO2 (%): 40 %    Vitals:   08/16/24 1130 08/16/24 1137 08/16/24 1232 08/16/24 1527  BP: (!) 142/59  (!) 160/77 (!) 141/70  Pulse: 85  87 84  Resp: (!) 21  16 16   Temp:   97.8 F (36.6 C) 97.7 F (36.5 C)  TempSrc:   Oral Oral  SpO2: 99%  96% 100%  Weight:  55.5 kg    Height:          Data Reviewed:  Basic Metabolic Panel: Recent Labs  Lab 08/11/24 0344 08/12/24 0829 08/13/24 0157 08/14/24 0230 08/16/24 0657  NA 137 135 134* 137 137  K 4.7 5.3* 4.8 5.4* 5.3*  CL 96* 96* 95* 98 97*  CO2 27 23 28 25 24   GLUCOSE 86 151* 112* 107* 124*  BUN 55* 77* 34* 55* 60*  CREATININE 5.85* 7.18* 3.84* 4.96* 5.38*  CALCIUM  9.9 9.9 9.2 9.4 9.6  MG 2.9*  --   --   --   --   PHOS 6.1* 6.4*  --  6.2* 6.6*    CBC: Recent Labs  Lab 08/11/24 0344 08/12/24 0829 08/14/24 0230 08/16/24 0657  WBC 3.1* 4.8 3.2* 4.2  NEUTROABS 1.9  --   --   --   HGB 8.7* 8.8* 9.0* 9.0*  HCT 26.9* 28.8* 28.7* 29.5*  MCV 99.6 104.3* 102.5* 104.6*  PLT 119* 141* 114* 129*    LFT Recent Labs  Lab 08/11/24 0344 08/12/24 0829 08/14/24 0230 08/16/24 0657  AST 10*  --   --   --   ALT 11  --   --   --   ALKPHOS 73  --   --   --   BILITOT 0.7  --    --   --   PROT 5.8*  --   --   --   ALBUMIN  3.0* 3.1* 2.9* 3.0*     Antibiotics: Anti-infectives (From admission, onward)    Start     Dose/Rate Route Frequency Ordered Stop   07/08/24 1530  fluconazole  (DIFLUCAN ) tablet 150 mg        150 mg Oral  Once 07/08/24 1437 07/08/24 1544   06/18/24 2030  ceFAZolin  (ANCEF ) IVPB 2g/100 mL premix  Status:  Discontinued        2 g 200 mL/hr over 30 Minutes Intravenous Every 8 hours 06/18/24 1454 06/19/24 1608   06/18/24 0600  ceFAZolin  (ANCEF ) IVPB 2g/100 mL premix  2 g 200 mL/hr over 30 Minutes Intravenous On call to O.R. 06/15/24 1152 06/18/24 1230   06/06/24 1400  piperacillin -tazobactam (ZOSYN ) IVPB 2.25 g        2.25 g 100 mL/hr over 30 Minutes Intravenous Every 8 hours 06/06/24 1017 06/09/24 0538   06/06/24 1200  vancomycin  (VANCOREADY) IVPB 500 mg/100 mL        500 mg 100 mL/hr over 60 Minutes Intravenous Every M-W-F (Hemodialysis) 06/04/24 1745 06/06/24 1900   06/04/24 1845  vancomycin  (VANCOCIN ) IVPB 1000 mg/200 mL premix        1,000 mg 200 mL/hr over 60 Minutes Intravenous  Once 06/04/24 1745 06/04/24 1915   06/04/24 1830  ceFEPIme  (MAXIPIME ) 2 g in sodium chloride  0.9 % 100 mL IVPB  Status:  Discontinued        2 g 200 mL/hr over 30 Minutes Intravenous Every M-W-F (Hemodialysis) 06/04/24 1743 06/06/24 1017   05/27/24 1015  cefTRIAXone  (ROCEPHIN ) 2 g in sodium chloride  0.9 % 100 mL IVPB        2 g 200 mL/hr over 30 Minutes Intravenous Every 24 hours 05/27/24 0918 05/29/24 1014   05/23/24 1530  ceFEPIme  (MAXIPIME ) 1 g in sodium chloride  0.9 % 100 mL IVPB  Status:  Discontinued        1 g 200 mL/hr over 30 Minutes Intravenous Every 24 hours 05/23/24 1433 05/27/24 0918   05/22/24 2200  Ampicillin -Sulbactam (UNASYN ) 3 g in sodium chloride  0.9 % 100 mL IVPB  Status:  Discontinued        3 g 200 mL/hr over 30 Minutes Intravenous Every 24 hours 05/22/24 0834 05/23/24 1433   05/22/24 0930  Ampicillin -Sulbactam (UNASYN ) 3 g in  sodium chloride  0.9 % 100 mL IVPB        3 g 200 mL/hr over 30 Minutes Intravenous  Once 05/22/24 0834 05/22/24 1900        CONSULTS neurosurgery, palliative care  Code Status: Full code  Family Communication:      Subjective   Denies any complaints  Objective    Physical Examination:  Appears in no acute distress S1-S2, regular Lungs clear to auscultation bilaterally Abdomen is soft, nontender, no organomegaly  Maurissa Ambrose S Anwyn Kriegel   [name] If 7PM-7AM, please contact night-coverage at [email or web address], Office  [phone]   08/16/2024, 4:20 PM  LOS: 89 days

## 2024-08-16 NOTE — Plan of Care (Signed)
°  Problem: Education: Goal: Knowledge of disease or condition will improve Outcome: Progressing   Problem: Education: Goal: Knowledge of General Education information will improve Description: Including pain rating scale, medication(s)/side effects and non-pharmacologic comfort measures Outcome: Progressing   Problem: Clinical Measurements: Goal: Ability to maintain clinical measurements within normal limits will improve Outcome: Progressing Goal: Will remain free from infection Outcome: Progressing Goal: Respiratory complications will improve Outcome: Progressing Goal: Cardiovascular complication will be avoided Outcome: Progressing   Problem: Activity: Goal: Risk for activity intolerance will decrease Outcome: Progressing

## 2024-08-16 NOTE — Progress Notes (Signed)
 Patient had her lunch post dialysis; up in the chair;  PT came to see her and she vomited; no acute distress; refused prn med.; will monitor.

## 2024-08-16 NOTE — Progress Notes (Signed)
 PT Cancellation Note  Patient Details Name: [name] [name] MRN: [id number] DOB: 1980/11/30   Cancelled Treatment:    Reason Eval/Treat Not Completed: Other (comment) (Pt actively vomiting upon this therapist entering room. Will defer therapy at this time.)   Shaniah Baltes 08/16/2024, 3:03 PM

## 2024-08-16 NOTE — Progress Notes (Signed)
 Received patient in bed.Alert and oriented x 4.Consent for treatment verified.  Access used: Right arm avf that worked well.  Duration of treatment: 3.5 hours.  Net uf: 1.4 L  Hemo comment: She quit her treatment on her last hour.  Hand off to the patient's nurse with stable vitals via transporter.

## 2024-08-17 LAB — BASIC METABOLIC PANEL WITH GFR
Anion gap: 7 (ref 5–15)
BUN: 38 mg/dL — ABNORMAL HIGH (ref 6–20)
CO2: 31 mmol/L (ref 22–32)
Calcium: 9.8 mg/dL (ref 8.9–10.3)
Chloride: 98 mmol/L (ref 98–111)
Creatinine, Ser: 3.73 mg/dL — ABNORMAL HIGH (ref 0.44–1.00)
GFR, Estimated: 15 mL/min — ABNORMAL LOW (ref >60)
Glucose, Bld: 66 mg/dL — ABNORMAL LOW (ref 70–99)
Potassium: 5.3 mmol/L — ABNORMAL HIGH (ref 3.5–5.1)
Sodium: 136 mmol/L (ref 135–145)

## 2024-08-17 MED ORDER — SODIUM ZIRCONIUM CYCLOSILICATE 10 G PO PACK
10.0000 g | PACK | Freq: Every day | ORAL | Status: DC
  Administered 2024-08-17 – 2024-08-18 (×2): 10 g via ORAL
  Filled 2024-08-17 (×2): qty 1

## 2024-08-17 MED ORDER — CYCLOSPORINE MODIFIED (GENGRAF) 25 MG PO CAPS
225.0000 mg | ORAL_CAPSULE | Freq: Every day | ORAL | Status: DC
  Administered 2024-08-17 – 2024-08-18 (×2): 225 mg via ORAL
  Filled 2024-08-17 (×3): qty 1

## 2024-08-17 NOTE — Progress Notes (Signed)
 [facility] Progress Note   Subjective:   Reports feeling well, states appetite is great. Denies SOB, CP, dizziness.   Objective Vitals:   08/16/24 2048 08/17/24 0040 08/17/24 0500 08/17/24 0910  BP: 135/65 (!) 144/63 (!) 136/59 128/62  Pulse: 82 77 84 87  Resp: 18 14 16 18   Temp: (!) 97.5 F (36.4 C) 97.9 F (36.6 C) 97.8 F (36.6 C) 98 F (36.7 C)  TempSrc: Oral Oral Oral Oral  SpO2: 98% 97% 96% 100%  Weight:      Height:       Physical Exam General: alert female in NAD Heart: RRR Lungs: CTAB, nml WOB on RA Abdomen: soft, NTND Extremities: no LE edema Dialysis Access: LU AVF   Additional Objective Labs: Basic Metabolic Panel: Recent Labs  Lab 08/12/24 0829 08/13/24 0157 08/14/24 0230 08/16/24 0657 08/17/24 0613  NA 135   < > 137 137 136  K 5.3*   < > 5.4* 5.3* 5.3*  CL 96*   < > 98 97* 98  CO2 23   < > 25 24 31   GLUCOSE 151*   < > 107* 124* 66*  BUN 77*   < > 55* 60* 38*  CREATININE 7.18*   < > 4.96* 5.38* 3.73*  CALCIUM  9.9   < > 9.4 9.6 9.8  PHOS 6.4*  --  6.2* 6.6*  --    < > = values in this interval not displayed.   Liver Function Tests: Recent Labs  Lab 08/11/24 0344 08/12/24 0829 08/14/24 0230 08/16/24 0657  AST 10*  --   --   --   ALT 11  --   --   --   ALKPHOS 73  --   --   --   BILITOT 0.7  --   --   --   PROT 5.8*  --   --   --   ALBUMIN  3.0* 3.1* 2.9* 3.0*   No results for input(s): LIPASE, AMYLASE in the last 168 hours. CBC: Recent Labs  Lab 08/11/24 0344 08/12/24 0829 08/14/24 0230 08/16/24 0657  WBC 3.1* 4.8 3.2* 4.2  NEUTROABS 1.9  --   --   --   HGB 8.7* 8.8* 9.0* 9.0*  HCT 26.9* 28.8* 28.7* 29.5*  MCV 99.6 104.3* 102.5* 104.6*  PLT 119* 141* 114* 129*   Blood Culture    Component Value Date/Time   SDES CSF 06/11/2024 0920   SPECREQUEST LP 06/11/2024 0920   CULT  06/11/2024 0920    NO GROWTH 3 DAYS Performed at [facility], [address], [place], [name] [address]    REPTSTATUS  06/14/2024 FINAL 06/11/2024 0920    Cardiac Enzymes: No results for input(s): CKTOTAL, CKMB, CKMBINDEX, TROPONINI in the last 168 hours. CBG: Recent Labs  Lab 08/10/24 1148 08/10/24 1753 08/12/24 1338  GLUCAP 161* 124* 143*   Iron  Studies: No results for input(s): IRON , TIBC, TRANSFERRIN, FERRITIN in the last 72 hours. @lablastinr3 @ Studies/Results: No results found. Medications:  cycloSPORINE  modified (GENGRAF ) 225 mg      amLODipine   10 mg Oral Daily   carvedilol   25 mg Oral BID WC   Chlorhexidine  Gluconate Cloth  6 each Topical Q0600   Chlorhexidine  Gluconate Cloth  6 each Topical Q0600   cinacalcet   30 mg Oral Q M,W,F   cycloSPORINE  modified  200 mg Oral QPM   darbepoetin (ARANESP ) injection - DIALYSIS  200 mcg Subcutaneous Q Thu-1800   diclofenac  Sodium  2 g Topical  QID   famotidine   10 mg Oral QHS   gabapentin   100 mg Oral Daily   hydrALAZINE   25 mg Oral Q6H    HYDROmorphone  (DILAUDID ) injection  0.25 mg Intravenous Once   lacosamide   100 mg Oral BID   losartan   50 mg Oral Q1200   multivitamin  1 tablet Oral QHS   mycophenolate   500 mg Oral BID   pantoprazole   40 mg Oral Daily   predniSONE   15 mg Oral Q breakfast   sevelamer  carbonate  800 mg Oral TID WC    Dialysis Orders: 3:45hr, 400/A1.5, EDW 55.2kg, 2K/2Ca bath, AVF, no heparin  - Prev on Mircera 150mcg IV q 2 weeks - No VDRA  Assessment/Plan: Acute R intracranial hemorrhage: 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 Needs ongoing rehab. Doing a lot better, memory and alertness much improved.  ESRD:  HD on MWF.  HD off schedule this week, currently TTS and will resume regular schedule next week. Last HD Saturday.  HTN/volume: BP somewhat labile - controlled today  Appearing euvolemic. Cont current meds - carvedilol  25mg  BID, hydralazine  25mg  TID, losartan , Amlodipine  10 mg daily.  UF with HD. Anemia of ESRD: Hgb 8s > 9s, transfuse prn. Continue Aranesp  200 q Thurs. 12/6 iron  sat 31%.   Secondary HPTH: CorrCa high  likely due to immobility. Phos remains elevated, added Renvela .  Continue sensipar , not on VDRA. Pth 126 on 08/07/24.  Nutrition: Alb low, supp per RD. Starting to increase PO intake. Watch K+ -has been trending up since she started eating more.  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 Dispo: Insurance declined LTAC, and SNF, appeal pending. Tolerated HD in recliner on multiple days    Lucie Collet, PA-C 08/17/2024, 10:07 AM  [facility] Pager: [phone]

## 2024-08-17 NOTE — Progress Notes (Signed)
 [facility]  PROGRESS NOTE  [name] [name] FMW:[id number] DOB: 1981/04/04 DOA: 05/19/2024 PCP: [facility]   Brief HPI:    43-yrs-old female with past medical history of hypertension, CVA, type 1 diabetes, gastroparesis, kidney and pancreatic transplant 2014 with subsequent failure of renal transplant now on end-stage renal dialysis, anxiety, narcotic abuse presented to the hospital from dialysis unit on 05/19/2024 as code stroke.  Patient was noted to have hemorrhagic stroke.  Neurology, neurosurgery in critical care were consulted.  EVD was placed on the left side and was initially admitted to neuro ICU.  She was also intubated and had prolonged intubation.  Subsequently, patient was extubated on 05/27/2024.   Important events:  9/15 admitted with ICH plus IVH, EVD was placed for hydrocephalus, intubated 9/22 palliative care meeting.  Family is insistent that they want full scope of care 9/23 Extubated 10/1 -developed fevers pancultures drawn and broad-spectrum antibiotics started 10/2 became afebrile after starting antibiotics, did not tolerate raising EVD 10/6 EVD was raised from 10 to 30 cm water , tolerating well, remained afebrile 10/7 EVD is at 30 cm water , CT scan will be done tomorrow morning, no overnight issues 10/8 she became somnolent, CT head showed increasing hydrocephalus, EVD was titrated down to 10 cm of water  with improvement in mental status 10/15 VP shunt placement 10/17 transferred to progressive unit with [name]  11/18-medically stable.  Awaiting for disposition.  Insurance declined LTAC and skilled nursing facility, appeal pending. 12/3-tube feeding changed to nocturnal tube feeds, started on p.o. diet 12/9 nocturnal tube feeds d/c'd    Assessment/Plan:   Acute right parietotemporal intraparenchymal hemorrhage / Intraventricular hemorrhage: Uncontrolled hypertension: Obstructive hydrocephalus s/p EVD changed to VP shunt: Seizure disorder S/p  VP shunt placement on 06/18/2024 by Dr. Mavis.  Continue Vimpat .   Neurosurgery intermittently followed the patient during hospitalization.     ESRD on dialysis: History of failed renal transplant: Secondary hyperparathyroidism: Nephrology on board.  Continue CellCept  and prednisone .   On Monday, Wednesday, Friday hemodialysis schedule.   TOC is working on finding outpatient hemodialysis set up  Hyponatremia trend  History of pancreatic/kidney transplantation:  Continue cyclosporine , mycophenolate , and prednisone  (will continue 15 mg dose based on discussion with renal)   Hypertension:  -Continue Coreg  25 mg p.o. twice daily -amlodipine  10 mg  - losartan  and hydralazine , new   Dizziness -resolved   Anemia of chronic disease:  Stable Transfuse if Hb below 7.0   Type 1 diabetes with vasculopathy/gastroparesis:  Not on long-term insulin  anymore due to pancreatic transplant.  A1c 3.9 Follow BG intermittently on BMP    Moderate protein calorie malnutrition/dysphagia:  S/p PEG She's taking PO well, tube feeds d/c'd   Debility/deconditioning/disposition/goals of care:  Palliative care had seen the patient during hospitalization.  Palliative care has signed off 10/17.  Disposition pending.  TOC on board.    No new needs  DVT prophylaxis: SCDs  Medications     amLODipine   10 mg Oral Daily   carvedilol   25 mg Oral BID WC   Chlorhexidine  Gluconate Cloth  6 each Topical Q0600   cinacalcet   30 mg Oral Q M,W,F   cycloSPORINE  modified  200 mg Oral QPM   darbepoetin (ARANESP ) injection - DIALYSIS  200 mcg Subcutaneous Q Thu-1800   diclofenac  Sodium  2 g Topical QID   famotidine   10 mg Oral QHS   gabapentin   100 mg Oral Daily   hydrALAZINE   25 mg Oral Q6H    HYDROmorphone  (DILAUDID ) injection  0.25 mg Intravenous Once   lacosamide   100 mg Oral BID   losartan   50 mg Oral Q1200   multivitamin  1 tablet Oral QHS   mycophenolate   500 mg Oral BID   pantoprazole   40 mg Oral  Daily   predniSONE   15 mg Oral Q breakfast   sevelamer  carbonate  800 mg Oral TID WC   sodium zirconium cyclosilicate   10 g Oral Daily     Data Reviewed:   CBG:  Recent Labs  Lab 08/10/24 1753 08/12/24 1338  GLUCAP 124* 143*    SpO2: 100 % O2 Flow Rate (L/min): 100 L/min FiO2 (%): 40 %    Vitals:   08/16/24 2048 08/17/24 0040 08/17/24 0500 08/17/24 0910  BP: 135/65 (!) 144/63 (!) 136/59 128/62  Pulse: 82 77 84 87  Resp: 18 14 16 18   Temp: (!) 97.5 F (36.4 C) 97.9 F (36.6 C) 97.8 F (36.6 C) 98 F (36.7 C)  TempSrc: Oral Oral Oral Oral  SpO2: 98% 97% 96% 100%  Weight:      Height:          Data Reviewed:  Basic Metabolic Panel: Recent Labs  Lab 08/11/24 0344 08/12/24 0829 08/13/24 0157 08/14/24 0230 08/16/24 0657 08/17/24 0613  NA 137 135 134* 137 137 136  K 4.7 5.3* 4.8 5.4* 5.3* 5.3*  CL 96* 96* 95* 98 97* 98  CO2 27 23 28 25 24 31   GLUCOSE 86 151* 112* 107* 124* 66*  BUN 55* 77* 34* 55* 60* 38*  CREATININE 5.85* 7.18* 3.84* 4.96* 5.38* 3.73*  CALCIUM  9.9 9.9 9.2 9.4 9.6 9.8  MG 2.9*  --   --   --   --   --   PHOS 6.1* 6.4*  --  6.2* 6.6*  --     CBC: Recent Labs  Lab 08/11/24 0344 08/12/24 0829 08/14/24 0230 08/16/24 0657  WBC 3.1* 4.8 3.2* 4.2  NEUTROABS 1.9  --   --   --   HGB 8.7* 8.8* 9.0* 9.0*  HCT 26.9* 28.8* 28.7* 29.5*  MCV 99.6 104.3* 102.5* 104.6*  PLT 119* 141* 114* 129*    LFT Recent Labs  Lab 08/11/24 0344 08/12/24 0829 08/14/24 0230 08/16/24 0657  AST 10*  --   --   --   ALT 11  --   --   --   ALKPHOS 73  --   --   --   BILITOT 0.7  --   --   --   PROT 5.8*  --   --   --   ALBUMIN  3.0* 3.1* 2.9* 3.0*     Antibiotics: Anti-infectives (From admission, onward)    Start     Dose/Rate Route Frequency Ordered Stop   07/08/24 1530  fluconazole  (DIFLUCAN ) tablet 150 mg        150 mg Oral  Once 07/08/24 1437 07/08/24 1544   06/18/24 2030  ceFAZolin  (ANCEF ) IVPB 2g/100 mL premix  Status:  Discontinued        2  g 200 mL/hr over 30 Minutes Intravenous Every 8 hours 06/18/24 1454 06/19/24 1608   06/18/24 0600  ceFAZolin  (ANCEF ) IVPB 2g/100 mL premix        2 g 200 mL/hr over 30 Minutes Intravenous On call to O.R. 06/15/24 1152 06/18/24 1230   06/06/24 1400  piperacillin -tazobactam (ZOSYN ) IVPB 2.25 g        2.25 g 100 mL/hr over 30 Minutes Intravenous Every 8 hours 06/06/24 1017  06/09/24 0538   06/06/24 1200  vancomycin  (VANCOREADY) IVPB 500 mg/100 mL        500 mg 100 mL/hr over 60 Minutes Intravenous Every M-W-F (Hemodialysis) 06/04/24 1745 06/06/24 1900   06/04/24 1845  vancomycin  (VANCOCIN ) IVPB 1000 mg/200 mL premix        1,000 mg 200 mL/hr over 60 Minutes Intravenous  Once 06/04/24 1745 06/04/24 1915   06/04/24 1830  ceFEPIme  (MAXIPIME ) 2 g in sodium chloride  0.9 % 100 mL IVPB  Status:  Discontinued        2 g 200 mL/hr over 30 Minutes Intravenous Every M-W-F (Hemodialysis) 06/04/24 1743 06/06/24 1017   05/27/24 1015  cefTRIAXone  (ROCEPHIN ) 2 g in sodium chloride  0.9 % 100 mL IVPB        2 g 200 mL/hr over 30 Minutes Intravenous Every 24 hours 05/27/24 0918 05/29/24 1014   05/23/24 1530  ceFEPIme  (MAXIPIME ) 1 g in sodium chloride  0.9 % 100 mL IVPB  Status:  Discontinued        1 g 200 mL/hr over 30 Minutes Intravenous Every 24 hours 05/23/24 1433 05/27/24 0918   05/22/24 2200  Ampicillin -Sulbactam (UNASYN ) 3 g in sodium chloride  0.9 % 100 mL IVPB  Status:  Discontinued        3 g 200 mL/hr over 30 Minutes Intravenous Every 24 hours 05/22/24 0834 05/23/24 1433   05/22/24 0930  Ampicillin -Sulbactam (UNASYN ) 3 g in sodium chloride  0.9 % 100 mL IVPB        3 g 200 mL/hr over 30 Minutes Intravenous  Once 05/22/24 0834 05/22/24 1900        CONSULTS neurosurgery, palliative care  Code Status: Full code  Family Communication:      Subjective   No complaints Asking when she'll be able to discharge   Objective    Physical Examination:  General: No acute distress. Lungs:  unlabored Neurological: Alert . Moves all extremities 4 . Cranial nerves II through XII grossly intact. Extremities: No clubbing or cyanosis. No edema.   Meliton Monte   [name] If 7PM-7AM, please contact night-coverage at [email or web address], Office  [phone]   08/17/2024, 1:08 PM  LOS: 90 days

## 2024-08-17 NOTE — Plan of Care (Signed)
  Problem: Education: Goal: Knowledge of General Education information will improve Description: Including pain rating scale, medication(s)/side effects and non-pharmacologic comfort measures Outcome: Progressing   Problem: Clinical Measurements: Goal: Ability to maintain clinical measurements within normal limits will improve Outcome: Progressing Goal: Will remain free from infection Outcome: Progressing Goal: Respiratory complications will improve Outcome: Progressing Goal: Cardiovascular complication will be avoided Outcome: Progressing   Problem: Activity: Goal: Risk for activity intolerance will decrease Outcome: Progressing   

## 2024-08-18 LAB — BASIC METABOLIC PANEL WITH GFR
Anion gap: 14 (ref 5–15)
BUN: 61 mg/dL — ABNORMAL HIGH (ref 6–20)
CO2: 25 mmol/L (ref 22–32)
Calcium: 10 mg/dL (ref 8.9–10.3)
Chloride: 98 mmol/L (ref 98–111)
Creatinine, Ser: 5.08 mg/dL — ABNORMAL HIGH (ref 0.44–1.00)
GFR, Estimated: 10 mL/min — ABNORMAL LOW (ref >60)
Glucose, Bld: 81 mg/dL (ref 70–99)
Potassium: 5.7 mmol/L — ABNORMAL HIGH (ref 3.5–5.1)
Sodium: 137 mmol/L (ref 135–145)

## 2024-08-18 MED ORDER — HYDRALAZINE HCL 50 MG PO TABS
50.0000 mg | ORAL_TABLET | Freq: Three times a day (TID) | ORAL | Status: DC
  Administered 2024-08-18 – 2024-08-20 (×5): 50 mg via ORAL
  Filled 2024-08-18 (×5): qty 1

## 2024-08-18 MED ORDER — CHLORHEXIDINE GLUCONATE CLOTH 2 % EX PADS
6.0000 | MEDICATED_PAD | Freq: Every day | CUTANEOUS | Status: DC
  Administered 2024-08-19 – 2024-08-20 (×2): 6 via TOPICAL

## 2024-08-18 NOTE — Plan of Care (Signed)
°  Problem: Education: Goal: Knowledge of disease or condition will improve Outcome: Progressing   Problem: Self-Care: Goal: Ability to participate in self-care as condition permits will improve Outcome: Progressing   Problem: Education: Goal: Knowledge of General Education information will improve Description: Including pain rating scale, medication(s)/side effects and non-pharmacologic comfort measures Outcome: Progressing   Problem: Health Behavior/Discharge Planning: Goal: Ability to manage health-related needs will improve Outcome: Progressing   Problem: Clinical Measurements: Goal: Ability to maintain clinical measurements within normal limits will improve Outcome: Progressing Goal: Will remain free from infection Outcome: Progressing Goal: Diagnostic test results will improve Outcome: Progressing Goal: Respiratory complications will improve Outcome: Progressing Goal: Cardiovascular complication will be avoided Outcome: Progressing   Problem: Activity: Goal: Risk for activity intolerance will decrease Outcome: Progressing   Problem: Nutrition: Goal: Adequate nutrition will be maintained Outcome: Progressing   Problem: Safety: Goal: Ability to remain free from injury will improve Outcome: Progressing   Problem: Skin Integrity: Goal: Risk for impaired skin integrity will decrease Outcome: Progressing

## 2024-08-18 NOTE — Progress Notes (Signed)
 [name]  PROGRESS NOTE  [name] [name] FMW:[id number] DOB: [birth year] DOA: 05/19/2024 PCP: [facility]   Brief HPI:    43-yrs-old female with past medical history of hypertension, CVA, type 1 diabetes, gastroparesis, kidney and pancreatic transplant 2014 with subsequent failure of renal transplant now on end-stage renal dialysis, anxiety, narcotic abuse presented to the hospital from dialysis unit on 05/19/2024 as code stroke.  Patient was noted to have hemorrhagic stroke.  Neurology, neurosurgery in critical care were consulted.  EVD was placed on the left side and was initially admitted to neuro ICU.  She was also intubated and had prolonged intubation.  Subsequently, patient was extubated on 05/27/2024.   Important events:  9/15 admitted with ICH plus IVH, EVD was placed for hydrocephalus, intubated 9/22 palliative care meeting.  Family is insistent that they want full scope of care 9/23 Extubated 10/1 -developed fevers pancultures drawn and broad-spectrum antibiotics started 10/2 became afebrile after starting antibiotics, did not tolerate raising EVD 10/6 EVD was raised from 10 to 30 cm water , tolerating well, remained afebrile 10/7 EVD is at 30 cm water , CT scan will be done tomorrow morning, no overnight issues 10/8 she became somnolent, CT head showed increasing hydrocephalus, EVD was titrated down to 10 cm of water  with improvement in mental status 10/15 VP shunt placement 10/17 transferred to progressive unit with [name]  11/18-medically stable.  Awaiting for disposition.  Insurance declined LTAC and skilled nursing facility, appeal pending. 12/3-tube feeding changed to nocturnal tube feeds, started on p.o. diet 12/9 nocturnal tube feeds d/c'd    Assessment/Plan:   Acute right parietotemporal intraparenchymal hemorrhage / Intraventricular hemorrhage: Uncontrolled hypertension: Obstructive hydrocephalus s/p EVD changed to VP shunt: Seizure disorder S/p VP  shunt placement on 06/18/2024 by Dr. Mavis.  Continue Vimpat .   Neurosurgery intermittently followed the patient during hospitalization.     ESRD on dialysis: History of failed renal transplant: Secondary hyperparathyroidism: Hyperkalemia  Nephrology on board.  Continue CellCept  and prednisone .   Potassium elevated, follow with lokelma  and dialysis On Monday, Wednesday, Friday hemodialysis schedule.   TOC is working on finding outpatient hemodialysis set up  Hyponatremia trend  History of pancreatic/kidney transplantation:  Continue cyclosporine , mycophenolate , and prednisone  (will continue 15 mg dose based on discussion with renal)   Hypertension:  -Continue Coreg  25 mg p.o. twice daily -amlodipine  10 mg  - not quite at goal, will adjust gradually as needed - losartan  50 mg daily and hydralazine  (increase to 50 q8), new   Dizziness -resolved   Anemia of chronic disease:  Stable Transfuse if Hb below 7.0   Type 1 diabetes with vasculopathy/gastroparesis:  Not on long-term insulin  anymore due to pancreatic transplant.  A1c 3.9 Follow BG intermittently on BMP    Moderate protein calorie malnutrition/dysphagia:  S/p PEG She's taking PO well, tube feeds d/c'd   Debility/deconditioning/disposition/goals of care:  Palliative care had seen the patient during hospitalization.  Palliative care has signed off 10/17.  Disposition pending.  TOC on board.    Working on blood pressure, following up with TOC regarding placement  DVT prophylaxis: SCDs  Medications     amLODipine   10 mg Oral Daily   carvedilol   25 mg Oral BID WC   Chlorhexidine  Gluconate Cloth  6 each Topical Q0600   cinacalcet   30 mg Oral Q M,W,F   cycloSPORINE  modified  200 mg Oral QPM   darbepoetin (ARANESP ) injection - DIALYSIS  200 mcg Subcutaneous Q Thu-1800   diclofenac  Sodium  2 g Topical  QID   famotidine   10 mg Oral QHS   gabapentin   100 mg Oral Daily   hydrALAZINE   50 mg Oral Q8H     HYDROmorphone  (DILAUDID ) injection  0.25 mg Intravenous Once   lacosamide   100 mg Oral BID   losartan   50 mg Oral Q1200   multivitamin  1 tablet Oral QHS   mycophenolate   500 mg Oral BID   pantoprazole   40 mg Oral Daily   predniSONE   15 mg Oral Q breakfast   sevelamer  carbonate  800 mg Oral TID WC   sodium zirconium cyclosilicate   10 g Oral Daily     Data Reviewed:   CBG:  Recent Labs  Lab 08/12/24 1338  GLUCAP 143*    SpO2: 99 % O2 Flow Rate (L/min): 100 L/min FiO2 (%): 40 %    Vitals:   08/17/24 2309 08/18/24 0318 08/18/24 0516 08/18/24 0745  BP: (!) 155/71 (!) 169/72 (!) 164/69 (!) 144/72  Pulse: 86 83 84 81  Resp: 17 15  18   Temp: 98.1 F (36.7 C) 97.8 F (36.6 C)  (!) 97.5 F (36.4 C)  TempSrc: Oral Axillary  Oral  SpO2: 97% 99%  99%  Weight:      Height:          Data Reviewed:  Basic Metabolic Panel: Recent Labs  Lab 08/12/24 0829 08/13/24 0157 08/14/24 0230 08/16/24 0657 08/17/24 0613 08/18/24 0454  NA 135 134* 137 137 136 137  K 5.3* 4.8 5.4* 5.3* 5.3* 5.7*  CL 96* 95* 98 97* 98 98  CO2 23 28 25 24 31 25   GLUCOSE 151* 112* 107* 124* 66* 81  BUN 77* 34* 55* 60* 38* 61*  CREATININE 7.18* 3.84* 4.96* 5.38* 3.73* 5.08*  CALCIUM  9.9 9.2 9.4 9.6 9.8 10.0  PHOS 6.4*  --  6.2* 6.6*  --   --     CBC: Recent Labs  Lab 08/12/24 0829 08/14/24 0230 08/16/24 0657  WBC 4.8 3.2* 4.2  HGB 8.8* 9.0* 9.0*  HCT 28.8* 28.7* 29.5*  MCV 104.3* 102.5* 104.6*  PLT 141* 114* 129*    LFT Recent Labs  Lab 08/12/24 0829 08/14/24 0230 08/16/24 0657  ALBUMIN  3.1* 2.9* 3.0*     Antibiotics: Anti-infectives (From admission, onward)    Start     Dose/Rate Route Frequency Ordered Stop   07/08/24 1530  fluconazole  (DIFLUCAN ) tablet 150 mg        150 mg Oral  Once 07/08/24 1437 07/08/24 1544   06/18/24 2030  ceFAZolin  (ANCEF ) IVPB 2g/100 mL premix  Status:  Discontinued        2 g 200 mL/hr over 30 Minutes Intravenous Every 8 hours 06/18/24 1454  06/19/24 1608   06/18/24 0600  ceFAZolin  (ANCEF ) IVPB 2g/100 mL premix        2 g 200 mL/hr over 30 Minutes Intravenous On call to O.R. 06/15/24 1152 06/18/24 1230   06/06/24 1400  piperacillin -tazobactam (ZOSYN ) IVPB 2.25 g        2.25 g 100 mL/hr over 30 Minutes Intravenous Every 8 hours 06/06/24 1017 06/09/24 0538   06/06/24 1200  vancomycin  (VANCOREADY) IVPB 500 mg/100 mL        500 mg 100 mL/hr over 60 Minutes Intravenous Every M-W-F (Hemodialysis) 06/04/24 1745 06/06/24 1900   06/04/24 1845  vancomycin  (VANCOCIN ) IVPB 1000 mg/200 mL premix        1,000 mg 200 mL/hr over 60 Minutes Intravenous  Once 06/04/24 1745 06/04/24 1915  06/04/24 1830  ceFEPIme  (MAXIPIME ) 2 g in sodium chloride  0.9 % 100 mL IVPB  Status:  Discontinued        2 g 200 mL/hr over 30 Minutes Intravenous Every M-W-F (Hemodialysis) 06/04/24 1743 06/06/24 1017   05/27/24 1015  cefTRIAXone  (ROCEPHIN ) 2 g in sodium chloride  0.9 % 100 mL IVPB        2 g 200 mL/hr over 30 Minutes Intravenous Every 24 hours 05/27/24 0918 05/29/24 1014   05/23/24 1530  ceFEPIme  (MAXIPIME ) 1 g in sodium chloride  0.9 % 100 mL IVPB  Status:  Discontinued        1 g 200 mL/hr over 30 Minutes Intravenous Every 24 hours 05/23/24 1433 05/27/24 0918   05/22/24 2200  Ampicillin -Sulbactam (UNASYN ) 3 g in sodium chloride  0.9 % 100 mL IVPB  Status:  Discontinued        3 g 200 mL/hr over 30 Minutes Intravenous Every 24 hours 05/22/24 0834 05/23/24 1433   05/22/24 0930  Ampicillin -Sulbactam (UNASYN ) 3 g in sodium chloride  0.9 % 100 mL IVPB        3 g 200 mL/hr over 30 Minutes Intravenous  Once 05/22/24 0834 05/22/24 1900        CONSULTS neurosurgery, palliative care  Code Status: Full code  Family Communication:      Subjective   No complaints Mom worried about blood pressure   Objective    Physical Examination:  General: No acute distress. Cardiovascular: RRR Lungs: unlabored Neurological: Alert . Moves all extremities 4  . Cranial nerves II through XII grossly intact. Extremities: No clubbing or cyanosis. No edema.   [name] [name]   [name] If 7PM-7AM, please contact night-coverage at [email or web address], Office  [phone]   08/18/2024, 9:01 AM  LOS: 91 days

## 2024-08-18 NOTE — Progress Notes (Signed)
 Physical Therapy Treatment Patient Details Name: [name] [name] MRN: [id number] DOB: March 18, 1981 Today's Date: 08/18/2024   History of Present Illness 43 yo female presents 05/19/24 for left gaze and L side weakness. 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 Intubated 9/15-9/23. EVD 9/15- 10/15; s/p shunt 10/15. S/p laparoscopy with intra-abdominal assistance ventricular-peritoneal shunt placement 10/15. PMH: HTN, ESRD on HD s/p renal and pancreatic transplants, DM, gastroparesis, multiple strokes (ischemic and hemorrhagic) with prepontine SAH, hydrocephalus requiring EVD placement    PT Comments  Pt tolerated treatment well today. Trialed ambulation in hallway with rollator. Mother providing CGA assistance. 3 seated rest breaks. Good use of brakes and overall safety with rollator. DC recs updated to HHPT with rollator, BSC, and WC. PT will continue to follow.     If plan is discharge home, recommend the following: A little help with walking and/or transfers;A little help with bathing/dressing/bathroom;Assistance with cooking/housework;Assist for transportation;Help with stairs or ramp for entrance   Can travel by private vehicle        Equipment Recommendations  Rollator (4 wheels);BSC/3in1;Wheelchair (measurements PT);Wheelchair cushion (measurements PT)    Recommendations for Other Services       Precautions / Restrictions Precautions Precautions: Fall Recall of Precautions/Restrictions: Impaired Precaution/Restrictions Comments: PEG Restrictions Weight Bearing Restrictions Per Provider Order: No     Mobility  Bed Mobility Overal bed mobility: Needs Assistance Bed Mobility: Supine to Sit     Supine to sit: Contact guard, Used rails     General bed mobility comments: CGA for trunk. Mother provided    Transfers Overall transfer level: Needs  assistance Equipment used: Rollator (4 wheels) Transfers: Sit to/from Stand Sit to Stand: Contact guard assist           General transfer comment: Pt with good carryover of rollator safety and brake management.    Ambulation/Gait Ambulation/Gait assistance: Contact guard assist Gait Distance (Feet): 125 Feet Assistive device: Rollator (4 wheels) Gait Pattern/deviations: Step-through pattern, Decreased stride length, Trunk flexed, Shuffle Gait velocity: Dec     General Gait Details: Mother providing CGA assistance. 3 seated rest breaks. Good use of brakes and overall safety with rollator.   Stairs             Wheelchair Mobility     Tilt Bed    Modified Rankin (Stroke Patients Only)       Balance Overall balance assessment: Needs assistance Sitting-balance support: No upper extremity supported, Feet supported Sitting balance-Leahy Scale: Good     Standing balance support: Bilateral upper extremity supported, During functional activity, Reliant on assistive device for balance Standing balance-Leahy Scale: Poor Standing balance comment: Reliant on external support.                            Communication Communication Communication: No apparent difficulties  Cognition Arousal: Alert Behavior During Therapy: WFL for tasks assessed/performed                             Following commands: Impaired      Cueing Cueing Techniques: Verbal cues, Gestural cues  Exercises      General Comments General comments (skin integrity, edema, etc.): VSS      Pertinent Vitals/Pain      Home Living  Prior Function            PT Goals (current goals can now be found in the care plan section) Progress towards PT goals: Progressing toward goals    Frequency    Min 2X/week      PT Plan      Co-evaluation              AM-PAC PT 6 Clicks Mobility   Outcome Measure  Help needed  turning from your back to your side while in a flat bed without using bedrails?: A Little Help needed moving from lying on your back to sitting on the side of a flat bed without using bedrails?: A Little Help needed moving to and from a bed to a chair (including a wheelchair)?: A Little Help needed standing up from a chair using your arms (e.g., wheelchair or bedside chair)?: A Little Help needed to walk in hospital room?: A Little Help needed climbing 3-5 steps with a railing? : A Lot 6 Click Score: 17    End of Session Equipment Utilized During Treatment: Gait belt Activity Tolerance: Patient tolerated treatment well Patient left: with call bell/phone within reach;in bed;with family/visitor present;with nursing/sitter in room (Seated EOB) Nurse Communication: Mobility status PT Visit Diagnosis: Unsteadiness on feet (R26.81);Other abnormalities of gait and mobility (R26.89);Muscle weakness (generalized) (M62.81);Difficulty in walking, not elsewhere classified (R26.2);Other symptoms and signs involving the nervous system (R29.898)     Time: 8851-8787 PT Time Calculation (min) (ACUTE ONLY): 24 min  Charges:    $Gait Training: 8-22 mins $Therapeutic Activity: 8-22 mins PT General Charges $$ ACUTE PT VISIT: 1 Visit                     Sueellen NOVAK, PT, DPT Acute Rehab Services [phone]    Shahid Flori 08/18/2024, 12:31 PM

## 2024-08-18 NOTE — Progress Notes (Addendum)
 Contacted by CSW with request to confirm pt's out-pt HD clinic/schedule at d/c since pt has been hospitalized for such an extended period of time. Contacted [facility] clinic staff to inquire/confirm pt's schedule when pt able to d/c from hospital. Will provide update to CSW once clinic staff respond. Will assist as needed.   Randine Mungo Dialysis Navigator [phone]  Addendum at 4:24 pm: Contacted by [facility] to be advised that pt's schedule will be MWF 12:05 pm start time at d/c. Update provided to CSW.

## 2024-08-18 NOTE — TOC Progression Note (Signed)
 Transition of Care [facility]) - Progression Note    Patient Details  Name: [name] [name] MRN: [id number] Date of Birth: 08/16/1981  Transition of Care [facility]) CM/SW Contact  [name], [name] Phone Number: 08/18/2024, 4:28 PM  Clinical Narrative:   CSW coordinating on getting patient home. CSW contacted PT assigned about DME recommendations, and contacted renal navigator to ensure patient's outpatient HD is still arranged. Patient will need to work on stairs prior to discharge. CSW to follow.    Expected Discharge Plan: Home w Home Health Services Barriers to Discharge: No SNF bed, Insurance Authorization               Expected Discharge Plan and Services In-house Referral: Clinical Social Work   Post Acute Care Choice: Skilled Nursing Facility Living arrangements for the past 2 months: Apartment                                       Social Drivers of Health (SDOH) Interventions SDOH Screenings   Food Insecurity: Patient Unable To Answer (05/20/2024)  Housing: Unknown (07/14/2024)  Transportation Needs: Patient Unable To Answer (05/20/2024)  Utilities: Patient Unable To Answer (05/20/2024)  Social Connections: Unknown (10/04/2022)   Received from [facility]  Stress: No Stress Concern Present (04/16/2024)   Received from [facility]  Tobacco Use: Medium Risk (06/18/2024)    Readmission Risk Interventions    05/20/2024    4:16 PM 03/31/2024   11:27 AM  Readmission Risk Prevention Plan  Transportation Screening Complete Complete  Medication Review Oceanographer) Complete Complete  PCP or Specialist appointment within 3-5 days of discharge Complete   HRI or Home Care Consult Complete Complete  SW Recovery Care/Counseling Consult Complete Complete  Palliative Care Screening Complete Not Applicable  Skilled Nursing Facility Complete Not Applicable

## 2024-08-18 NOTE — Plan of Care (Signed)
°  Problem: Education: Goal: Knowledge of disease or condition will improve Outcome: Progressing   Problem: Education: Goal: Knowledge of General Education information will improve Description: Including pain rating scale, medication(s)/side effects and non-pharmacologic comfort measures Outcome: Progressing   Problem: Clinical Measurements: Goal: Will remain free from infection Outcome: Progressing Goal: Respiratory complications will improve Outcome: Progressing Goal: Cardiovascular complication will be avoided Outcome: Progressing

## 2024-08-18 NOTE — Progress Notes (Signed)
 [facility] Progress Note   Subjective:    Seen and examined patient at bedside. Patient's mother also at bedside. She is looking well this morning. Denies any acute issues. Next HD 12/16 and will change back to MWF later this week.  Objective Vitals:   08/18/24 0318 08/18/24 0516 08/18/24 0745 08/18/24 1124  BP: (!) 169/72 (!) 164/69 (!) 144/72 (!) 139/58  Pulse: 83 84 81 80  Resp: 15  18 18   Temp: 97.8 F (36.6 C)  (!) 97.5 F (36.4 C) (!) 97.5 F (36.4 C)  TempSrc: Axillary  Oral Oral  SpO2: 99%  99% 99%  Weight:      Height:       Physical Exam General: alert female in NAD Heart: RRR Lungs: CTAB, nml WOB on RA Abdomen: soft, NTND Extremities: no LE edema Dialysis Access: LU AVF   Filed Weights   08/16/24 0811 08/16/24 1126 08/16/24 1137  Weight: 57 kg 55.5 kg 55.5 kg   No intake or output data in the 24 hours ending 08/18/24 1421  Additional Objective Labs: Basic Metabolic Panel: Recent Labs  Lab 08/12/24 0829 08/13/24 0157 08/14/24 0230 08/16/24 0657 08/17/24 0613 08/18/24 0454  NA 135   < > 137 137 136 137  K 5.3*   < > 5.4* 5.3* 5.3* 5.7*  CL 96*   < > 98 97* 98 98  CO2 23   < > 25 24 31 25   GLUCOSE 151*   < > 107* 124* 66* 81  BUN 77*   < > 55* 60* 38* 61*  CREATININE 7.18*   < > 4.96* 5.38* 3.73* 5.08*  CALCIUM  9.9   < > 9.4 9.6 9.8 10.0  PHOS 6.4*  --  6.2* 6.6*  --   --    < > = values in this interval not displayed.   Liver Function Tests: Recent Labs  Lab 08/12/24 0829 08/14/24 0230 08/16/24 0657  ALBUMIN  3.1* 2.9* 3.0*   No results for input(s): LIPASE, AMYLASE in the last 168 hours. CBC: Recent Labs  Lab 08/12/24 0829 08/14/24 0230 08/16/24 0657  WBC 4.8 3.2* 4.2  HGB 8.8* 9.0* 9.0*  HCT 28.8* 28.7* 29.5*  MCV 104.3* 102.5* 104.6*  PLT 141* 114* 129*   Blood Culture    Component Value Date/Time   SDES CSF 06/11/2024 0920   SPECREQUEST LP 06/11/2024 0920   CULT  06/11/2024 0920    NO GROWTH 3  DAYS Performed at [facility], [address], [place], [name] [address]    REPTSTATUS 06/14/2024 FINAL 06/11/2024 0920    Cardiac Enzymes: No results for input(s): CKTOTAL, CKMB, CKMBINDEX, TROPONINI in the last 168 hours. CBG: Recent Labs  Lab 08/12/24 1338  GLUCAP 143*   Iron  Studies: No results for input(s): IRON , TIBC, TRANSFERRIN, FERRITIN in the last 72 hours. Lab Results  Component Value Date   INR 1.1 06/18/2024   INR 1.1 06/16/2024   INR 1.1 05/20/2024   Studies/Results: No results found.  Medications:  cycloSPORINE  modified (GENGRAF ) 225 mg      amLODipine   10 mg Oral Daily   carvedilol   25 mg Oral BID WC   Chlorhexidine  Gluconate Cloth  6 each Topical Q0600   cinacalcet   30 mg Oral Q M,W,F   cycloSPORINE  modified  200 mg Oral QPM   darbepoetin (ARANESP ) injection - DIALYSIS  200 mcg Subcutaneous Q Thu-1800   diclofenac  Sodium  2 g Topical QID   famotidine   10 mg  Oral QHS   gabapentin   100 mg Oral Daily   hydrALAZINE   50 mg Oral Q8H    HYDROmorphone  (DILAUDID ) injection  0.25 mg Intravenous Once   lacosamide   100 mg Oral BID   losartan   50 mg Oral Q1200   multivitamin  1 tablet Oral QHS   mycophenolate   500 mg Oral BID   pantoprazole   40 mg Oral Daily   predniSONE   15 mg Oral Q breakfast   sevelamer  carbonate  800 mg Oral TID WC   sodium zirconium cyclosilicate   10 g Oral Daily    Dialysis Orders: 3:45hr, 400/A1.5, EDW 55.2kg, 2K/2Ca bath, AVF, no heparin  - Prev on Mircera 150mcg IV q 2 weeks - No VDRA  Assessment/Plan: Acute R intracranial hemorrhage: 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 Needs ongoing rehab. Doing a lot better, memory and alertness much improved.  ESRD:  HD on MWF.  HD off schedule and currently TTS. Will resume regular schedule sometime this week.  HTN/volume: BP somewhat labile - controlled today  Appearing euvolemic. Cont current meds - carvedilol  25mg  BID, hydralazine  25mg  TID, losartan , Amlodipine  10  mg daily.  UF with HD. Anemia of ESRD: Hgb 8s > 9s, transfuse prn. Continue Aranesp  200 q Thurs. 12/6 iron  sat 31%.  Secondary HPTH: CorrCa high  likely due to immobility. Phos remains elevated, added Renvela .  Continue sensipar , not on VDRA. Pth 126 on 08/07/24.  Nutrition: Alb low, supp per RD. Starting to increase PO intake. Watch K+ -has been trending up since she started eating more.  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 Dispo: Insurance declined LTAC, and SNF, appeal pending. Tolerated HD in recliner on multiple days  [name], NP [facility] 08/18/2024,2:21 PM  LOS: 91 days

## 2024-08-19 ENCOUNTER — Inpatient Hospital Stay (HOSPITAL_COMMUNITY)

## 2024-08-19 DIAGNOSIS — E440 Moderate protein-calorie malnutrition: Secondary | ICD-10-CM | POA: Diagnosis not present

## 2024-08-19 DIAGNOSIS — J9601 Acute respiratory failure with hypoxia: Secondary | ICD-10-CM | POA: Diagnosis not present

## 2024-08-19 DIAGNOSIS — I615 Nontraumatic intracerebral hemorrhage, intraventricular: Secondary | ICD-10-CM | POA: Diagnosis not present

## 2024-08-19 DIAGNOSIS — N186 End stage renal disease: Secondary | ICD-10-CM | POA: Diagnosis not present

## 2024-08-19 DIAGNOSIS — G9340 Encephalopathy, unspecified: Secondary | ICD-10-CM | POA: Diagnosis not present

## 2024-08-19 DIAGNOSIS — J189 Pneumonia, unspecified organism: Secondary | ICD-10-CM | POA: Diagnosis not present

## 2024-08-19 DIAGNOSIS — J150 Pneumonia due to Klebsiella pneumoniae: Secondary | ICD-10-CM | POA: Diagnosis not present

## 2024-08-19 DIAGNOSIS — I161 Hypertensive emergency: Secondary | ICD-10-CM | POA: Diagnosis not present

## 2024-08-19 DIAGNOSIS — R29731 NIHSS score 31: Secondary | ICD-10-CM | POA: Diagnosis not present

## 2024-08-19 DIAGNOSIS — Z515 Encounter for palliative care: Secondary | ICD-10-CM | POA: Diagnosis not present

## 2024-08-19 DIAGNOSIS — J1569 Pneumonia due to other gram-negative bacteria: Secondary | ICD-10-CM | POA: Diagnosis not present

## 2024-08-19 DIAGNOSIS — G911 Obstructive hydrocephalus: Secondary | ICD-10-CM | POA: Diagnosis not present

## 2024-08-19 LAB — HEPATIC FUNCTION PANEL
ALT: 40 U/L (ref 0–44)
AST: 35 U/L (ref 15–41)
Albumin: 3.6 g/dL (ref 3.5–5.0)
Alkaline Phosphatase: 96 U/L (ref 38–126)
Bilirubin, Direct: 0.1 mg/dL (ref 0.0–0.2)
Indirect Bilirubin: 0.3 mg/dL (ref 0.3–0.9)
Total Bilirubin: 0.4 mg/dL (ref 0.0–1.2)
Total Protein: 6 g/dL — ABNORMAL LOW (ref 6.5–8.1)

## 2024-08-19 LAB — LIPASE, BLOOD: Lipase: 34 U/L (ref 11–51)

## 2024-08-19 LAB — RENAL FUNCTION PANEL
Albumin: 2.9 g/dL — ABNORMAL LOW (ref 3.5–5.0)
Anion gap: 14 (ref 5–15)
BUN: 80 mg/dL — ABNORMAL HIGH (ref 6–20)
CO2: 26 mmol/L (ref 22–32)
Calcium: 9.5 mg/dL (ref 8.9–10.3)
Chloride: 98 mmol/L (ref 98–111)
Creatinine, Ser: 6.45 mg/dL — ABNORMAL HIGH (ref 0.44–1.00)
GFR, Estimated: 8 mL/min — ABNORMAL LOW (ref >60)
Glucose, Bld: 70 mg/dL (ref 70–99)
Phosphorus: 7.9 mg/dL — ABNORMAL HIGH (ref 2.5–4.6)
Potassium: 6.2 mmol/L — ABNORMAL HIGH (ref 3.5–5.1)
Sodium: 138 mmol/L (ref 135–145)

## 2024-08-19 LAB — BASIC METABOLIC PANEL WITH GFR
Anion gap: 19 — ABNORMAL HIGH (ref 5–15)
BUN: 80 mg/dL — ABNORMAL HIGH (ref 6–20)
CO2: 23 mmol/L (ref 22–32)
Calcium: 10.4 mg/dL — ABNORMAL HIGH (ref 8.9–10.3)
Chloride: 97 mmol/L — ABNORMAL LOW (ref 98–111)
Creatinine, Ser: 6.68 mg/dL — ABNORMAL HIGH (ref 0.44–1.00)
GFR, Estimated: 7 mL/min — ABNORMAL LOW (ref >60)
Glucose, Bld: 112 mg/dL — ABNORMAL HIGH (ref 70–99)
Potassium: 5.7 mmol/L — ABNORMAL HIGH (ref 3.5–5.1)
Sodium: 139 mmol/L (ref 135–145)

## 2024-08-19 MED ORDER — INSULIN ASPART 100 UNIT/ML IV SOLN
5.0000 [IU] | Freq: Once | INTRAVENOUS | Status: AC
  Administered 2024-08-19: 11:00:00 5 [IU] via INTRAVENOUS
  Filled 2024-08-19: qty 5

## 2024-08-19 MED ORDER — ACETAMINOPHEN 325 MG PO TABS
ORAL_TABLET | ORAL | Status: AC
  Filled 2024-08-19: qty 1

## 2024-08-19 MED ORDER — HEPARIN SODIUM (PORCINE) 1000 UNIT/ML DIALYSIS: 1000.0000 [IU] | INTRAMUSCULAR | Status: DC | PRN

## 2024-08-19 MED ORDER — CYCLOSPORINE MODIFIED (GENGRAF) 25 MG PO CAPS
225.0000 mg | ORAL_CAPSULE | Freq: Every day | ORAL | Status: DC
  Filled 2024-08-19: qty 9

## 2024-08-19 MED ORDER — PENTAFLUOROPROP-TETRAFLUOROETH EX AERO: 1.0000 | INHALATION_SPRAY | CUTANEOUS | Status: DC | PRN

## 2024-08-19 MED ORDER — CYCLOSPORINE MODIFIED (GENGRAF) 25 MG PO CAPS
225.0000 mg | ORAL_CAPSULE | Freq: Every day | ORAL | Status: DC
  Administered 2024-08-19 – 2024-08-24 (×3): 225 mg via ORAL
  Filled 2024-08-19 (×15): qty 1

## 2024-08-19 MED ORDER — DEXTROSE 50 % IV SOLN
1.0000 | Freq: Once | INTRAVENOUS | Status: AC
  Administered 2024-08-19: 11:00:00 50 mL via INTRAVENOUS
  Filled 2024-08-19: qty 50

## 2024-08-19 MED ORDER — LIDOCAINE-PRILOCAINE 2.5-2.5 % EX CREA: 1.0000 | TOPICAL_CREAM | CUTANEOUS | Status: DC | PRN

## 2024-08-19 MED ORDER — OXYCODONE HCL 5 MG PO TABS
ORAL_TABLET | ORAL | Status: AC
  Filled 2024-08-19: qty 1

## 2024-08-19 MED ORDER — SODIUM ZIRCONIUM CYCLOSILICATE 10 G PO PACK
10.0000 g | PACK | Freq: Two times a day (BID) | ORAL | Status: DC
  Administered 2024-08-19 – 2024-08-20 (×3): 10 g via ORAL
  Filled 2024-08-19 (×3): qty 1

## 2024-08-19 MED ORDER — LIDOCAINE HCL (PF) 1 % IJ SOLN: 5.0000 mL | INTRAMUSCULAR | Status: DC | PRN

## 2024-08-19 MED ORDER — ALTEPLASE 2 MG IJ SOLR: 2.0000 mg | Freq: Once | INTRAMUSCULAR | Status: DC | PRN

## 2024-08-19 NOTE — Progress Notes (Signed)
 Received patient in bed.Alert and oriented x 4. Consent for treatment verified.  Access used :Right arm avf that worked well.  Duration of treatment: 3.5 hours.  Uf goal : Met 1 liter.  Medicines given: Tylenol  650 mg.                             Oxycodone   5 mg.  Hand off to the patient's nurse with stable vitals via transporter.

## 2024-08-19 NOTE — Progress Notes (Signed)
 Patient was transported to HD prior to being placed on cardiac monitoring. She is currently being monitored while in HD. CCMD called to verify visualization of the patient while in HD. Patient will be placed on unit monitor once she returns to inpatient unit.

## 2024-08-19 NOTE — TOC Progression Note (Signed)
 Transition of Care [facility]) - Progression Note    Patient Details  Name: [name] [name] MRN: [id number] Date of Birth: 1980-12-16  Transition of Care [facility]) CM/SW Contact  [name], [name] Phone Number: 08/19/2024, 1:01 PM  Clinical Narrative:   CSW met with patient and mother at bedside to discuss discharge home. Patient in one-level apartment, no need for work on stairs. Recommendations for wheelchair, rollator, and 3N1, which patient and mother in agreement with. Patient can only have wheelchair or rollator covered by insurance, mother considering which she would prefer. CSW provided mother with pricing information, she would prefer to order the wheelchair through insurance and private pay for the rollator.   Patient previously drove herself to dialysis, will need transportation arranged. CSW met with patient to complete application for [facility] [place] and submit application. Application submitted, awaiting review and approval.   Mother would need transportation back to patient's apartment to be there for equipment delivery, she is reaching out to her friend to see when she would be able to be at the patient's apartment for equipment. CSW to follow back with her.    Expected Discharge Plan: Home w Home Health Services Barriers to Discharge: No SNF bed, Insurance Authorization               Expected Discharge Plan and Services In-house Referral: Clinical Social Work   Post Acute Care Choice: Skilled Nursing Facility Living arrangements for the past 2 months: Apartment                                       Social Drivers of Health (SDOH) Interventions SDOH Screenings   Food Insecurity: Patient Unable To Answer (05/20/2024)  Housing: Unknown (07/14/2024)  Transportation Needs: Patient Unable To Answer (05/20/2024)  Utilities: Patient Unable To Answer (05/20/2024)  Social Connections: Unknown (10/04/2022)   Received from [facility]  Stress: No Stress Concern  Present (04/16/2024)   Received from [facility]  Tobacco Use: Medium Risk (06/18/2024)    Readmission Risk Interventions    05/20/2024    4:16 PM 03/31/2024   11:27 AM  Readmission Risk Prevention Plan  Transportation Screening Complete Complete  Medication Review Oceanographer) Complete Complete  PCP or Specialist appointment within 3-5 days of discharge Complete   HRI or Home Care Consult Complete Complete  SW Recovery Care/Counseling Consult Complete Complete  Palliative Care Screening Complete Not Applicable  Skilled Nursing Facility Complete Not Applicable

## 2024-08-19 NOTE — Plan of Care (Signed)
 Pt went to HD at around 1410 and received around 1835 accompanied by the transporter. Focused assessment and vital signs were monitored  Problem: Education: Goal: Knowledge of secondary prevention will improve (MUST DOCUMENT ALL) Outcome: Progressing   Problem: Education: Goal: Knowledge of patient specific risk factors will improve (DELETE if not current risk factor) Outcome: Progressing   Problem: Education: Goal: Knowledge of disease or condition will improve Outcome: Progressing   Problem: Self-Care: Goal: Ability to participate in self-care as condition permits will improve Outcome: Progressing   Problem: Clinical Measurements: Goal: Will remain free from infection Outcome: Progressing   Problem: Skin Integrity: Goal: Risk for impaired skin integrity will decrease Outcome: Progressing

## 2024-08-19 NOTE — Progress Notes (Signed)
 [name]  PROGRESS NOTE  [name] [name] FMW:[id number] DOB: [birth year] DOA: 05/19/2024 [facility]CP: [facility]   Brief HPI:    43-yrs-old female with past medical history of hypertension, CVA, type 1 diabetes, gastroparesis, kidney and pancreatic transplant 2014 with subsequent failure of renal transplant now on end-stage renal dialysis, anxiety, narcotic abuse presented to the hospital from dialysis unit on 05/19/2024 as code stroke.  Patient was noted to have hemorrhagic stroke.  Neurology, neurosurgery in critical care were consulted.  EVD was placed on the left side and was initially admitted to neuro ICU.  She was also intubated and had prolonged intubation.  Subsequently, patient was extubated on 05/27/2024.   Important events:  9/15 admitted with ICH plus IVH, EVD was placed for hydrocephalus, intubated 9/22 palliative care meeting.  Family is insistent that they want full scope of care 9/23 Extubated 10/1 -developed fevers pancultures drawn and broad-spectrum antibiotics started 10/2 became afebrile after starting antibiotics, did not tolerate raising EVD 10/6 EVD was raised from 10 to 30 cm water , tolerating well, remained afebrile 10/7 EVD is at 30 cm water , CT scan will be done tomorrow morning, no overnight issues 10/8 she became somnolent, CT head showed increasing hydrocephalus, EVD was titrated down to 10 cm of water  with improvement in mental status 10/15 VP shunt placement 10/17 transferred to progressive unit with [name]  11/18-medically stable.  Awaiting for disposition.  Insurance declined LTAC and skilled nursing facility, appeal pending. 12/3-tube feeding changed to nocturnal tube feeds, started on p.o. diet 12/9 nocturnal tube feeds d/c'd    Assessment/Plan:   Acute right parietotemporal intraparenchymal hemorrhage / Intraventricular hemorrhage: Uncontrolled hypertension: Obstructive hydrocephalus s/p EVD changed to VP shunt: Seizure disorder S/p VP  shunt placement on 06/18/2024 by Dr. Mavis.  Continue Vimpat .   Neurosurgery intermittently followed the patient during hospitalization.     ESRD on dialysis: History of failed renal transplant: Secondary hyperparathyroidism: Hyperkalemia  Nephrology on board.  Continue CellCept  and prednisone .   Potassium rising, EKG is similar to prior -  insulin /D50, lokelma .  Dialysis later today per renal. On Monday, Wednesday, Friday hemodialysis schedule.   TOC is working on finding outpatient hemodialysis set up  Right Upper Quadrant Pain Follow RUQ US   Hyponatremia trend  History of pancreatic/kidney transplantation:  Continue cyclosporine , mycophenolate , and prednisone  (will continue 15 mg dose based on discussion with renal)   Hypertension:  -Continue Coreg  25 mg p.o. twice daily -amlodipine  10 mg  - not quite at goal, will adjust gradually as needed - losartan  d/c'd with hyperkalemia - hydralazine  (increase to 50 q8), new   Dizziness -resolved   Anemia of chronic disease:  Stable Transfuse if Hb below 7.0   Type 1 diabetes with vasculopathy/gastroparesis:  Not on long-term insulin  anymore due to pancreatic transplant.  A1c 3.9 Follow BG intermittently on BMP    Moderate protein calorie malnutrition/dysphagia:  S/p PEG She's taking PO well, tube feeds d/c'd   Debility/deconditioning/disposition/goals of care:  Palliative care had seen the patient during hospitalization.  Palliative care has signed off 10/17.  Disposition pending.  TOC on board.    Working on blood pressure, following up with TOC regarding placement  DVT prophylaxis: SCDs  Medications     amLODipine   10 mg Oral Daily   carvedilol   25 mg Oral BID WC   Chlorhexidine  Gluconate Cloth  6 each Topical Q0600   cinacalcet   30 mg Oral Q M,W,F   cycloSPORINE  modified  200 mg Oral QPM   darbepoetin (  ARANESP ) injection - DIALYSIS  200 mcg Subcutaneous Q Thu-1800   diclofenac  Sodium  2 g Topical QID    famotidine   10 mg Oral QHS   gabapentin   100 mg Oral Daily   hydrALAZINE   50 mg Oral Q8H    HYDROmorphone  (DILAUDID ) injection  0.25 mg Intravenous Once   lacosamide   100 mg Oral BID   multivitamin  1 tablet Oral QHS   mycophenolate   500 mg Oral BID   pantoprazole   40 mg Oral Daily   predniSONE   15 mg Oral Q breakfast   sevelamer  carbonate  800 mg Oral TID WC   sodium zirconium cyclosilicate   10 g Oral BID     Data Reviewed:   CBG:  Recent Labs  Lab 08/12/24 1338  GLUCAP 143*    SpO2: 97 % O2 Flow Rate (L/min): 100 L/min FiO2 (%): 40 %    Vitals:   08/19/24 0431 08/19/24 0613 08/19/24 0814 08/19/24 1218  BP: (!) 167/70 (!) 160/74 131/69 134/66  Pulse:   80 80  Resp:   16 16  Temp:   (!) 97.3 F (36.3 C) 97.9 F (36.6 C)  TempSrc:   Oral Oral  SpO2:   100% 97%  Weight:      Height:          Data Reviewed:  Basic Metabolic Panel: Recent Labs  Lab 08/14/24 0230 08/16/24 0657 08/17/24 0613 08/18/24 0454 08/19/24 0439  NA 137 137 136 137 138  K 5.4* 5.3* 5.3* 5.7* 6.2*  CL 98 97* 98 98 98  CO2 25 24 31 25 26   GLUCOSE 107* 124* 66* 81 70  BUN 55* 60* 38* 61* 80*  CREATININE 4.96* 5.38* 3.73* 5.08* 6.45*  CALCIUM  9.4 9.6 9.8 10.0 9.5  PHOS 6.2* 6.6*  --   --  7.9*    CBC: Recent Labs  Lab 08/14/24 0230 08/16/24 0657  WBC 3.2* 4.2  HGB 9.0* 9.0*  HCT 28.7* 29.5*  MCV 102.5* 104.6*  PLT 114* 129*    LFT Recent Labs  Lab 08/14/24 0230 08/16/24 0657 08/19/24 0439  ALBUMIN  2.9* 3.0* 2.9*     Antibiotics: Anti-infectives (From admission, onward)    Start     Dose/Rate Route Frequency Ordered Stop   07/08/24 1530  fluconazole  (DIFLUCAN ) tablet 150 mg        150 mg Oral  Once 07/08/24 1437 07/08/24 1544   06/18/24 2030  ceFAZolin  (ANCEF ) IVPB 2g/100 mL premix  Status:  Discontinued        2 g 200 mL/hr over 30 Minutes Intravenous Every 8 hours 06/18/24 1454 06/19/24 1608   06/18/24 0600  ceFAZolin  (ANCEF ) IVPB 2g/100 mL premix        2  g 200 mL/hr over 30 Minutes Intravenous On call to O.R. 06/15/24 1152 06/18/24 1230   06/06/24 1400  piperacillin -tazobactam (ZOSYN ) IVPB 2.25 g        2.25 g 100 mL/hr over 30 Minutes Intravenous Every 8 hours 06/06/24 1017 06/09/24 0538   06/06/24 1200  vancomycin  (VANCOREADY) IVPB 500 mg/100 mL        500 mg 100 mL/hr over 60 Minutes Intravenous Every M-W-F (Hemodialysis) 06/04/24 1745 06/06/24 1900   06/04/24 1845  vancomycin  (VANCOCIN ) IVPB 1000 mg/200 mL premix        1,000 mg 200 mL/hr over 60 Minutes Intravenous  Once 06/04/24 1745 06/04/24 1915   06/04/24 1830  ceFEPIme  (MAXIPIME ) 2 g in sodium chloride  0.9 % 100 mL IVPB  Status:  Discontinued        2 g 200 mL/hr over 30 Minutes Intravenous Every M-W-F (Hemodialysis) 06/04/24 1743 06/06/24 1017   05/27/24 1015  cefTRIAXone  (ROCEPHIN ) 2 g in sodium chloride  0.9 % 100 mL IVPB        2 g 200 mL/hr over 30 Minutes Intravenous Every 24 hours 05/27/24 0918 05/29/24 1014   05/23/24 1530  ceFEPIme  (MAXIPIME ) 1 g in sodium chloride  0.9 % 100 mL IVPB  Status:  Discontinued        1 g 200 mL/hr over 30 Minutes Intravenous Every 24 hours 05/23/24 1433 05/27/24 0918   05/22/24 2200  Ampicillin -Sulbactam (UNASYN ) 3 g in sodium chloride  0.9 % 100 mL IVPB  Status:  Discontinued        3 g 200 mL/hr over 30 Minutes Intravenous Every 24 hours 05/22/24 0834 05/23/24 1433   05/22/24 0930  Ampicillin -Sulbactam (UNASYN ) 3 g in sodium chloride  0.9 % 100 mL IVPB        3 g 200 mL/hr over 30 Minutes Intravenous  Once 05/22/24 0834 05/22/24 1900        CONSULTS neurosurgery, palliative care  Code Status: Full code  Family Communication:      Subjective   Mom at bedside C/o RUQ abdominal pain today   Objective    Physical Examination:  General: No acute distress. Cardiovascular: RRR Lungs: unlabored Abdomen: RUQ pain  Neurological: Alert and oriented 3. Moves all extremities 4 with equal strength. Cranial nerves II through XII  grossly intact. Extremities: No clubbing or cyanosis. No edema.  Meliton Monte   [name] If 7PM-7AM, please contact night-coverage at [email or web address], Office  [phone]   08/19/2024, 12:21 PM  LOS: 92 days

## 2024-08-19 NOTE — Progress Notes (Signed)
 Occupational Therapy Treatment Patient Details Name: [name] [name] MRN: [id number] DOB: 1980/12/25 Today's Date: 08/19/2024   History of present illness 43 yo female presents 05/19/24 for left gaze and L side weakness. 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 Intubated 9/15-9/23. EVD 9/15- 10/15; s/p shunt 10/15. S/p laparoscopy with intra-abdominal assistance ventricular-peritoneal shunt placement 10/15. PMH: HTN, ESRD on HD s/p renal and pancreatic transplants, DM, gastroparesis, multiple strokes (ischemic and hemorrhagic) with prepontine SAH, hydrocephalus requiring EVD placement   OT comments  Patient demonstrating good gains with OT treatment with patient able to donn LB clothing seated on EOB with supervision and CGA while standing to pull up. Patient performed mobility with rollator walker with her mother providing assistance for caregiver training with education on safety and use of brakes.  Discharge recommendations changed from SNF to [facility] due to patient's gains and her mother able to provide assistance following discharge.  Acute OT to continue to follow to address established goals.        If plan is discharge home, recommend the following:  Assistance with cooking/housework;Assist for transportation;Help with stairs or ramp for entrance;A little help with walking and/or transfers;Direct supervision/assist for medications management;Direct supervision/assist for financial management;Supervision due to cognitive status;A little help with bathing/dressing/bathroom   Equipment Recommendations  BSC/3in1    Recommendations for Other Services      Precautions / Restrictions Precautions Precautions: Fall Recall of Precautions/Restrictions: Impaired Precaution/Restrictions Comments: PEG Restrictions Weight Bearing Restrictions Per Provider Order: No        Mobility Bed Mobility Overal bed mobility: Needs Assistance Bed Mobility: Supine to Sit, Sit to Supine     Supine to sit: Contact guard, Used rails Sit to supine: Contact guard assist, HOB elevated, Used rails   General bed mobility comments: CGA for trunk    Transfers Overall transfer level: Needs assistance Equipment used: Rollator (4 wheels) Transfers: Sit to/from Stand Sit to Stand: Contact guard assist           General transfer comment: patient's mother providing assistance for caregiver training     Balance Overall balance assessment: Needs assistance Sitting-balance support: No upper extremity supported, Feet supported Sitting balance-Leahy Scale: Good Sitting balance - Comments: EOB   Standing balance support: Bilateral upper extremity supported, During functional activity, Reliant on assistive device for balance Standing balance-Leahy Scale: Poor Standing balance comment: Reliant on external support.                           ADL either performed or assessed with clinical judgement   ADL Overall ADL's : Needs assistance/impaired     Grooming: Contact guard assist;Wash/dry hands;Standing Grooming Details (indicate cue type and reason): at sink             Lower Body Dressing: Contact guard assist;Sitting/lateral leans Lower Body Dressing Details (indicate cue type and reason): able to donn socks seated on EOB, donned paper scrubs seated on EOB with CGA when pulling up clothing                    Extremity/Trunk Assessment              Vision       Perception     Praxis     Communication Communication Communication: No apparent difficulties   Cognition Arousal: Alert Behavior During Therapy: [facility] for tasks  assessed/performed Cognition: Cognition impaired     Awareness: Intellectual awareness impaired Memory impairment (select all impairments): Short-term memory, Working memory Attention impairment (select first  level of impairment): Sustained attention Executive functioning impairment (select all impairments): Initiation, Organization, Sequencing, Problem solving                   Following commands: Impaired Following commands impaired: Follows one step commands with increased time, Follows multi-step commands inconsistently      Cueing   Cueing Techniques: Verbal cues, Gestural cues  Exercises      Shoulder Instructions       General Comments VSS    Pertinent Vitals/ Pain       Pain Assessment Pain Assessment: No/denies pain  Home Living                                          Prior Functioning/Environment              Frequency  Min 2X/week        Progress Toward Goals  OT Goals(current goals can now be found in the care plan section)  Progress towards OT goals: Progressing toward goals     Plan      Co-evaluation                 AM-PAC OT 6 Clicks Daily Activity     Outcome Measure   Help from another person eating meals?: A Little Help from another person taking care of personal grooming?: A Little Help from another person toileting, which includes using toliet, bedpan, or urinal?: A Little Help from another person bathing (including washing, rinsing, drying)?: A Lot Help from another person to put on and taking off regular upper body clothing?: A Little Help from another person to put on and taking off regular lower body clothing?: A Little 6 Click Score: 17    End of Session Equipment Utilized During Treatment: Gait belt;Rollator (4 wheels)  OT Visit Diagnosis: Unsteadiness on feet (R26.81);Muscle weakness (generalized) (M62.81);History of falling (Z91.81);Other symptoms and signs involving cognitive function;Cognitive communication deficit (R41.841);Low vision, both eyes (H54.2)   Activity Tolerance Patient tolerated treatment well   Patient Left in bed;with call bell/phone within reach;with bed alarm set;with  family/visitor present   Nurse Communication Mobility status        Time: 9091-9066 OT Time Calculation (min): 25 min  Charges: OT General Charges $OT Visit: 1 Visit OT Treatments $Self Care/Home Management : 8-22 mins $Therapeutic Activity: 8-22 mins  [name], OTA Acute Rehabilitation Services  Office [phone]   [name] 08/19/2024, 10:55 AM

## 2024-08-19 NOTE — Plan of Care (Signed)
°  Problem: Education: Goal: Knowledge of disease or condition will improve Outcome: Progressing Goal: Knowledge of secondary prevention will improve (MUST DOCUMENT ALL) Outcome: Progressing Goal: Knowledge of patient specific risk factors will improve (DELETE if not current risk factor) Outcome: Progressing   Problem: Self-Care: Goal: Ability to participate in self-care as condition permits will improve Outcome: Progressing   Problem: Education: Goal: Knowledge of General Education information will improve Description: Including pain rating scale, medication(s)/side effects and non-pharmacologic comfort measures Outcome: Progressing   Problem: Health Behavior/Discharge Planning: Goal: Ability to manage health-related needs will improve Outcome: Progressing   Problem: Clinical Measurements: Goal: Ability to maintain clinical measurements within normal limits will improve Outcome: Progressing Goal: Will remain free from infection Outcome: Progressing Goal: Diagnostic test results will improve Outcome: Progressing

## 2024-08-19 NOTE — Progress Notes (Signed)
 Nutrition Follow-up  DOCUMENTATION CODES:  Non-severe (moderate) malnutrition in context of chronic illness  INTERVENTION:  Continue current diet as ordered, consider restrictions if renal labs consistently high Encourage PO intake Renal MVI daily Pt no longer with acute needs and meeting her nutrition needs orally. RD will sign off at this time. If new acute needs arise, please submit a new consult  NUTRITION DIAGNOSIS:  Moderate Malnutrition related to chronic illness (ESRD on HD/DM and gastroparesis) as evidenced by severe muscle depletion, mild fat depletion. - remains applicable  GOAL:  Patient will meet greater than or equal to 90% of their needs - progressing  MONITOR:  PO intake, Weight trends, TF tolerance, I & O's, Labs  REASON FOR ASSESSMENT:  Consult Calorie Count  ASSESSMENT:  43 year old female who presented as a Code Stroke with IVH. 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 Recent admissions 6/11-7/28 and 7/29-8/13 for ICH with GJ tube.  9/16 - Admitted, EVD placed, intubated 9/21 - switched to Nepro formula; FMS placed 9/22 - Extubated  9/23 - adjusted rate of Nepro to meet needs (was previously exceeding) 9/25 - D/C Nepro, Started Osmolite 1.5 10/3 - Renal changed TF to Nepro due to higher K (likely due to no HD over weekend) 10/15 s/p conversion from EVD to VP shunt  10/16 - Transferred to PCU 11/24 - MBS, ok for regular/thin when awake and alert 12/3 - TF adjusted to nocturnal 12/9 - TF discontinued  Pt out of room for HD at the time of assessment. Reviewed intake over the last week since TF discontinued and intake appears to be adequate. Weight is stable. Pt no longer with acute needs and meeting her nutrition needs orally. RD will sign off at this time. If new acute needs arise,  please submit a new consult  Average Meal Intake: 12/8-12/13: 86% average intake x 8 recorded meals  Admit weight: 50.4 kg  Current weight: 58.2 kg   EDW 55.2kg   Nutritionally Relevant Medications: Scheduled Meds:  cinacalcet   30 mg Oral Q M,W,F   cycloSPORINE  modified  200 mg Oral QPM   famotidine   10 mg Oral QHS   multivitamin  1 tablet Oral QHS   mycophenolate   500 mg Oral BID   pantoprazole   40 mg Oral Daily   predniSONE   15 mg Oral Q breakfast   sevelamer  carbonate  800 mg Oral TID WC   sodium zirconium cyclosilicate   10 g Oral BID   Continuous Infusions:  cycloSPORINE  modified (GENGRAF ) 225 mg     PRN Meds: loperamide , ondansetron , phenol, sennosides, simethicone   Labs Reviewed: Potassium 5.7 Chloride 97 BUN 80, creatinine 6.68 Phosphorus 7.9  NUTRITION - FOCUSED PHYSICAL EXAM: Flowsheet Row Most Recent Value  Orbital Region Mild depletion  Upper Arm Region Moderate depletion  Thoracic and Lumbar Region Mild depletion  Buccal Region Mild depletion  Temple Region Severe depletion  Clavicle Bone Region Severe depletion  Clavicle and Acromion Bone Region Severe depletion  Scapular Bone Region Severe depletion  Dorsal Hand Moderate depletion  Patellar Region Severe depletion  Anterior Thigh Region Severe depletion  Posterior Calf Region Severe depletion  Edema (RD Assessment) Mild  Hair Reviewed  Eyes Unable to assess  Mouth Unable to assess  Skin Reviewed  [Closed surgical incision on head]  Nails Reviewed    Diet Order:   Diet Order  Diet regular Room service appropriate? Yes with Assist; Fluid consistency: Thin  Diet effective now                   EDUCATION NEEDS:  Education needs have been addressed  Skin:  Skin Assessment: Reviewed RN documentation  Last BM:  12/11  Height:  Ht Readings from Last 1 Encounters:  06/18/24 5' 3 (1.6 m)    Weight:  Wt Readings from Last 1 Encounters:  08/19/24 58.2 kg    Ideal Body  Weight:  52.3 kg  BMI:  Body mass index is 22.73 kg/m.  Estimated Nutritional Needs:  Kcal:  1700-1900 Protein:  80-100 grams Fluid:  1L +UOP    Vernell Lukes, RD, LDN, CNSC Registered Dietitian II Please reach out via secure chat

## 2024-08-19 NOTE — Progress Notes (Addendum)
 [facility] Progress Note   Subjective:    Seen and examined patient at bedside. She's complaining of RUQ pain and informed me imaging is supposed to be done. Current K+ 6.2. Plan for HD this afternoon and will use 1K bath for then transition back to 2K.  Objective Vitals:   08/19/24 0400 08/19/24 0431 08/19/24 0613 08/19/24 0814  BP: (!) 167/70 (!) 167/70 (!) 160/74 131/69  Pulse: 80   80  Resp: 16   16  Temp: 97.8 F (36.6 C)   (!) 97.3 F (36.3 C)  TempSrc: Oral   Oral  SpO2: 97%   100%  Weight:      Height:       Physical Exam General: Alert female in NAD Heart: RRR Lungs: CTAB, nml WOB on RA Abdomen: soft, NTND Extremities: No LE edema Dialysis Access: LU AVF   Filed Weights   08/16/24 0811 08/16/24 1126 08/16/24 1137  Weight: 57 kg 55.5 kg 55.5 kg   No intake or output data in the 24 hours ending 08/19/24 1201  Additional Objective Labs: Basic Metabolic Panel: Recent Labs  Lab 08/14/24 0230 08/16/24 0657 08/17/24 0613 08/18/24 0454 08/19/24 0439  NA 137 137 136 137 138  K 5.4* 5.3* 5.3* 5.7* 6.2*  CL 98 97* 98 98 98  CO2 25 24 31 25 26   GLUCOSE 107* 124* 66* 81 70  BUN 55* 60* 38* 61* 80*  CREATININE 4.96* 5.38* 3.73* 5.08* 6.45*  CALCIUM  9.4 9.6 9.8 10.0 9.5  PHOS 6.2* 6.6*  --   --  7.9*   Liver Function Tests: Recent Labs  Lab 08/14/24 0230 08/16/24 0657 08/19/24 0439  ALBUMIN  2.9* 3.0* 2.9*   No results for input(s): LIPASE, AMYLASE in the last 168 hours. CBC: Recent Labs  Lab 08/14/24 0230 08/16/24 0657  WBC 3.2* 4.2  HGB 9.0* 9.0*  HCT 28.7* 29.5*  MCV 102.5* 104.6*  PLT 114* 129*   Blood Culture    Component Value Date/Time   SDES CSF 06/11/2024 0920   SPECREQUEST LP 06/11/2024 0920   CULT  06/11/2024 0920    NO GROWTH 3 DAYS Performed at [facility], [address] [place], [name] [address]    REPTSTATUS 06/14/2024 FINAL 06/11/2024 0920    Cardiac Enzymes: No results for input(s):  CKTOTAL, CKMB, CKMBINDEX, TROPONINI in the last 168 hours. CBG: Recent Labs  Lab 08/12/24 1338  GLUCAP 143*   Iron  Studies: No results for input(s): IRON , TIBC, TRANSFERRIN, FERRITIN in the last 72 hours. Lab Results  Component Value Date   INR 1.1 06/18/2024   INR 1.1 06/16/2024   INR 1.1 05/20/2024   Studies/Results: No results found.  Medications:  cycloSPORINE  modified (GENGRAF ) 225 mg      amLODipine   10 mg Oral Daily   carvedilol   25 mg Oral BID WC   Chlorhexidine  Gluconate Cloth  6 each Topical Q0600   cinacalcet   30 mg Oral Q M,W,F   cycloSPORINE  modified  200 mg Oral QPM   darbepoetin (ARANESP ) injection - DIALYSIS  200 mcg Subcutaneous Q Thu-1800   diclofenac  Sodium  2 g Topical QID   famotidine   10 mg Oral QHS   gabapentin   100 mg Oral Daily   hydrALAZINE   50 mg Oral Q8H    HYDROmorphone  (DILAUDID ) injection  0.25 mg Intravenous Once   lacosamide   100 mg Oral BID   multivitamin  1 tablet Oral QHS   mycophenolate   500 mg Oral BID  pantoprazole   40 mg Oral Daily   predniSONE   15 mg Oral Q breakfast   sevelamer  carbonate  800 mg Oral TID WC   sodium zirconium cyclosilicate   10 g Oral BID    Dialysis Orders: 3:45hr, 400/A1.5, EDW 55.2kg, 2K/2Ca bath, AVF, no heparin  - Prev on Mircera 150mcg IV q 2 weeks - No VDRA  Assessment/Plan: Acute R intracranial hemorrhage: 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 Needs ongoing rehab. Doing a lot better, memory and alertness much improved.  ESRD:  HD on MWF.  HD off schedule and currently TTS. Will dialyze her today and tomorrow to get her back on her routine schedule.  HTN/volume: BP somewhat labile - controlled today  Appearing euvolemic. Cont current meds - carvedilol  25mg  BID, hydralazine  25mg  TID, losartan , Amlodipine  10 mg daily.  UF with HD. Anemia of ESRD: Hgb 8s > 9s, transfuse prn. Continue Aranesp  200 q Thurs. 12/6 iron  sat 31%.  Secondary HPTH: CorrCa high  likely due to immobility. Phos  remains elevated, added Renvela .  Continue sensipar , not on VDRA. Pth 126 on 08/07/24.  Nutrition: Alb low, supp per RD. Starting to increase PO intake. Watch K+ -has been trending up since she started eating more.  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 Dispo: Insurance declined LTAC, and SNF, appeal pending. Tolerated HD in recliner on multiple days  [name], NP [facility] 08/19/2024,12:01 PM  LOS: 92 days

## 2024-08-19 NOTE — Plan of Care (Signed)
  Problem: Education: Goal: Knowledge of disease or condition will improve Outcome: Progressing Goal: Knowledge of secondary prevention will improve (MUST DOCUMENT ALL) Outcome: Progressing Goal: Knowledge of patient specific risk factors will improve (DELETE if not current risk factor) Outcome: Progressing   Problem: Self-Care: Goal: Ability to participate in self-care as condition permits will improve Outcome: Progressing   Problem: Education: Goal: Knowledge of General Education information will improve Description: Including pain rating scale, medication(s)/side effects and non-pharmacologic comfort measures Outcome: Progressing   Problem: Health Behavior/Discharge Planning: Goal: Ability to manage health-related needs will improve Outcome: Progressing   Problem: Clinical Measurements: Goal: Ability to maintain clinical measurements within normal limits will improve Outcome: Progressing Goal: Will remain free from infection Outcome: Progressing Goal: Diagnostic test results will improve Outcome: Progressing Goal: Respiratory complications will improve Outcome: Progressing Goal: Cardiovascular complication will be avoided Outcome: Progressing   Problem: Activity: Goal: Risk for activity intolerance will decrease Outcome: Progressing   Problem: Nutrition: Goal: Adequate nutrition will be maintained Outcome: Progressing   Problem: Safety: Goal: Ability to remain free from injury will improve Outcome: Progressing   Problem: Skin Integrity: Goal: Risk for impaired skin integrity will decrease Outcome: Progressing

## 2024-08-20 DIAGNOSIS — I619 Nontraumatic intracerebral hemorrhage, unspecified: Secondary | ICD-10-CM | POA: Diagnosis not present

## 2024-08-20 LAB — BASIC METABOLIC PANEL WITH GFR
Anion gap: 12 (ref 5–15)
BUN: 34 mg/dL — ABNORMAL HIGH (ref 6–20)
CO2: 29 mmol/L (ref 22–32)
Calcium: 9.6 mg/dL (ref 8.9–10.3)
Chloride: 96 mmol/L — ABNORMAL LOW (ref 98–111)
Creatinine, Ser: 3.36 mg/dL — ABNORMAL HIGH (ref 0.44–1.00)
GFR, Estimated: 17 mL/min — ABNORMAL LOW (ref >60)
Glucose, Bld: 82 mg/dL (ref 70–99)
Potassium: 4.2 mmol/L (ref 3.5–5.1)
Sodium: 138 mmol/L (ref 135–145)

## 2024-08-20 MED ORDER — ISOSORBIDE MONONITRATE ER 30 MG PO TB24: 15.0000 mg | ORAL_TABLET | Freq: Every day | ORAL | Status: DC

## 2024-08-20 MED ORDER — HYDRALAZINE HCL 50 MG PO TABS
75.0000 mg | ORAL_TABLET | Freq: Three times a day (TID) | ORAL | Status: DC
  Administered 2024-08-20 – 2024-08-21 (×3): 75 mg via ORAL
  Filled 2024-08-20 (×4): qty 1

## 2024-08-20 MED ORDER — SODIUM ZIRCONIUM CYCLOSILICATE 10 G PO PACK
10.0000 g | PACK | Freq: Every day | ORAL | Status: DC
  Administered 2024-08-21 – 2024-08-27 (×4): 10 g via ORAL
  Filled 2024-08-20 (×6): qty 1

## 2024-08-20 MED ORDER — CHLORHEXIDINE GLUCONATE CLOTH 2 % EX PADS
6.0000 | MEDICATED_PAD | Freq: Every day | CUTANEOUS | Status: DC
  Administered 2024-08-21: 07:00:00 6 via TOPICAL

## 2024-08-20 NOTE — Progress Notes (Addendum)
 [name]  PROGRESS NOTE  [name] [name] FMW:[id number] DOB: 1981-08-11 DOA: 05/19/2024 PCP: [facility]   Brief HPI:    43-yrs-old female with past medical history of hypertension, CVA, type 1 diabetes, gastroparesis, kidney and pancreatic transplant 2014 with subsequent failure of renal transplant now on end-stage renal dialysis, anxiety, narcotic abuse presented to the hospital from dialysis unit on 05/19/2024 as code stroke.  Patient was noted to have hemorrhagic stroke.  Neurology, neurosurgery in critical care were consulted.  EVD was placed on the left side and was initially admitted to neuro ICU.  She was also intubated and had prolonged intubation.  Subsequently, patient was extubated on 05/27/2024.   Important events:  9/15 admitted with ICH plus IVH, EVD was placed for hydrocephalus, intubated 9/22 palliative care meeting.  Family is insistent that they want full scope of care 9/23 Extubated 10/1 -developed fevers pancultures drawn and broad-spectrum antibiotics started 10/2 became afebrile after starting antibiotics, did not tolerate raising EVD 10/6 EVD was raised from 10 to 30 cm water , tolerating well, remained afebrile 10/7 EVD is at 30 cm water , CT scan will be done tomorrow morning, no overnight issues 10/8 she became somnolent, CT head showed increasing hydrocephalus, EVD was titrated down to 10 cm of water  with improvement in mental status 10/15 VP shunt placement 10/17 transferred to progressive unit with [name]  11/18-medically stable.  Awaiting for disposition.  Insurance declined LTAC and skilled nursing facility, appeal pending. 12/3-tube feeding changed to nocturnal tube feeds, started on p.o. diet 12/9 nocturnal tube feeds d/c'd    Assessment/Plan:   Acute right parietotemporal intraparenchymal hemorrhage / Intraventricular hemorrhage: Uncontrolled hypertension: Obstructive hydrocephalus s/p EVD changed to VP shunt: Seizure disorder S/p VP  shunt placement on 06/18/2024 by Dr. Mavis.  Continue Vimpat .   Neurosurgery intermittently followed the patient during hospitalization.     ESRD on dialysis: History of failed renal transplant: Secondary hyperparathyroidism: Hyperkalemia  Nephrology on board.  Continue CellCept  and prednisone .   On Monday, Wednesday, Friday hemodialysis schedule.   Can resume outpatient HD center at discharge ([facility] on MWF )  Right Upper Quadrant Pain RUQ US  with cholelithiasis  Will monitor, resolved today  If persistent warrants additional workup   Hyponatremia improved  History of pancreatic/kidney transplantation:  Continue cyclosporine , mycophenolate , and prednisone  (will continue 15 mg dose based on discussion with renal)   Hypertension:  -Continue Coreg  25 mg p.o. twice daily -amlodipine  10 mg  - hydralazine  (increase to 75 q8), new  - not quite at goal, will adjust gradually as needed - losartan  d/c'd with hyperkalemia  Dizziness -resolved   Anemia of chronic disease:  Stable Transfuse if Hb below 7.0   Type 1 diabetes with vasculopathy/gastroparesis:  Not on long-term insulin  anymore due to pancreatic transplant.  A1c 3.9 Follow BG intermittently on BMP    Moderate protein calorie malnutrition/dysphagia:  S/p PEG She's taking PO well, tube feeds d/c'd   Debility/deconditioning/disposition/goals of care:  Palliative care had seen the patient during hospitalization.  Palliative care has signed off 10/17.  Disposition pending.  TOC on board.    Continuing to work on blood pressure.  RUQ pain has improved.  DVT prophylaxis: SCDs  Medications     amLODipine   10 mg Oral Daily   carvedilol   25 mg Oral BID WC   [START ON 08/21/2024] Chlorhexidine  Gluconate Cloth  6 each Topical Q0600   cinacalcet   30 mg Oral Q M,W,F   cycloSPORINE  modified  200 mg Oral  QPM   darbepoetin (ARANESP ) injection - DIALYSIS  200 mcg Subcutaneous Q Thu-1800   diclofenac  Sodium  2 g  Topical QID   famotidine   10 mg Oral QHS   gabapentin   100 mg Oral Daily   hydrALAZINE   50 mg Oral Q8H    HYDROmorphone  (DILAUDID ) injection  0.25 mg Intravenous Once   lacosamide   100 mg Oral BID   multivitamin  1 tablet Oral QHS   mycophenolate   500 mg Oral BID   pantoprazole   40 mg Oral Daily   predniSONE   15 mg Oral Q breakfast   sevelamer  carbonate  800 mg Oral TID WC   sodium zirconium cyclosilicate   10 g Oral BID     Data Reviewed:   CBG:  No results for input(s): GLUCAP in the last 168 hours.   SpO2: 94 % O2 Flow Rate (L/min): 100 L/min FiO2 (%): 40 %    Vitals:   08/20/24 0340 08/20/24 0725 08/20/24 1100 08/20/24 1110  BP: (!) 159/68 (!) 172/63 (!) 143/54 (!) 143/54  Pulse: 83 83 82   Resp: 18 17 18    Temp: 98.9 F (37.2 C) 99.1 F (37.3 C) 99.2 F (37.3 C)   TempSrc: Oral Oral Oral   SpO2: 97% 98% 94%   Weight:      Height:          Data Reviewed:  Basic Metabolic Panel: Recent Labs  Lab 08/14/24 0230 08/16/24 0657 08/17/24 0613 08/18/24 0454 08/19/24 0439 08/19/24 1213 08/20/24 0217  NA 137 137 136 137 138 139 138  K 5.4* 5.3* 5.3* 5.7* 6.2* 5.7* 4.2  CL 98 97* 98 98 98 97* 96*  CO2 25 24 31 25 26 23 29   GLUCOSE 107* 124* 66* 81 70 112* 82  BUN 55* 60* 38* 61* 80* 80* 34*  CREATININE 4.96* 5.38* 3.73* 5.08* 6.45* 6.68* 3.36*  CALCIUM  9.4 9.6 9.8 10.0 9.5 10.4* 9.6  PHOS 6.2* 6.6*  --   --  7.9*  --   --     CBC: Recent Labs  Lab 08/14/24 0230 08/16/24 0657  WBC 3.2* 4.2  HGB 9.0* 9.0*  HCT 28.7* 29.5*  MCV 102.5* 104.6*  PLT 114* 129*    LFT Recent Labs  Lab 08/14/24 0230 08/16/24 0657 08/19/24 0439 08/19/24 1952  AST  --   --   --  35  ALT  --   --   --  40  ALKPHOS  --   --   --  96  BILITOT  --   --   --  0.4  PROT  --   --   --  6.0*  ALBUMIN  2.9* 3.0* 2.9* 3.6     Antibiotics: Anti-infectives (From admission, onward)    Start     Dose/Rate Route Frequency Ordered Stop   07/08/24 1530  fluconazole   (DIFLUCAN ) tablet 150 mg        150 mg Oral  Once 07/08/24 1437 07/08/24 1544   06/18/24 2030  ceFAZolin  (ANCEF ) IVPB 2g/100 mL premix  Status:  Discontinued        2 g 200 mL/hr over 30 Minutes Intravenous Every 8 hours 06/18/24 1454 06/19/24 1608   06/18/24 0600  ceFAZolin  (ANCEF ) IVPB 2g/100 mL premix        2 g 200 mL/hr over 30 Minutes Intravenous On call to O.R. 06/15/24 1152 06/18/24 1230   06/06/24 1400  piperacillin -tazobactam (ZOSYN ) IVPB 2.25 g        2.25  g 100 mL/hr over 30 Minutes Intravenous Every 8 hours 06/06/24 1017 06/09/24 0538   06/06/24 1200  vancomycin  (VANCOREADY) IVPB 500 mg/100 mL        500 mg 100 mL/hr over 60 Minutes Intravenous Every M-W-F (Hemodialysis) 06/04/24 1745 06/06/24 1900   06/04/24 1845  vancomycin  (VANCOCIN ) IVPB 1000 mg/200 mL premix        1,000 mg 200 mL/hr over 60 Minutes Intravenous  Once 06/04/24 1745 06/04/24 1915   06/04/24 1830  ceFEPIme  (MAXIPIME ) 2 g in sodium chloride  0.9 % 100 mL IVPB  Status:  Discontinued        2 g 200 mL/hr over 30 Minutes Intravenous Every M-W-F (Hemodialysis) 06/04/24 1743 06/06/24 1017   05/27/24 1015  cefTRIAXone  (ROCEPHIN ) 2 g in sodium chloride  0.9 % 100 mL IVPB        2 g 200 mL/hr over 30 Minutes Intravenous Every 24 hours 05/27/24 0918 05/29/24 1014   05/23/24 1530  ceFEPIme  (MAXIPIME ) 1 g in sodium chloride  0.9 % 100 mL IVPB  Status:  Discontinued        1 g 200 mL/hr over 30 Minutes Intravenous Every 24 hours 05/23/24 1433 05/27/24 0918   05/22/24 2200  Ampicillin -Sulbactam (UNASYN ) 3 g in sodium chloride  0.9 % 100 mL IVPB  Status:  Discontinued        3 g 200 mL/hr over 30 Minutes Intravenous Every 24 hours 05/22/24 0834 05/23/24 1433   05/22/24 0930  Ampicillin -Sulbactam (UNASYN ) 3 g in sodium chloride  0.9 % 100 mL IVPB        3 g 200 mL/hr over 30 Minutes Intravenous  Once 05/22/24 0834 05/22/24 1900        CONSULTS neurosurgery, palliative care  Code Status: Full code  Family  Communication:      Subjective   Mom at bedside Sleeping at the time of my evaluation - mom noted no issues   Objective    Physical Examination:  General: No acute distress. Lungs: unlabored Neurological: asleep (I didn't awaken her as her mother noted things going well) Extremities: No clubbing or cyanosis. No edema.  [name] [name]   [name] If 7PM-7AM, please contact night-coverage at [email or web address], Office  [phone]   08/20/2024, 1:06 PM  LOS: 93 days

## 2024-08-20 NOTE — Plan of Care (Signed)
°  Problem: Education: Goal: Knowledge of disease or condition will improve Outcome: Progressing Goal: Knowledge of secondary prevention will improve (MUST DOCUMENT ALL) Outcome: Progressing Goal: Knowledge of patient specific risk factors will improve (DELETE if not current risk factor) Outcome: Progressing   Problem: Self-Care: Goal: Ability to participate in self-care as condition permits will improve Outcome: Progressing   Problem: Education: Goal: Knowledge of General Education information will improve Description: Including pain rating scale, medication(s)/side effects and non-pharmacologic comfort measures Outcome: Progressing   Problem: Health Behavior/Discharge Planning: Goal: Ability to manage health-related needs will improve Outcome: Progressing   Problem: Clinical Measurements: Goal: Ability to maintain clinical measurements within normal limits will improve Outcome: Progressing

## 2024-08-20 NOTE — Progress Notes (Signed)
 Physical Therapy Treatment Patient Details Name: [name] [name] MRN: [id number] DOB: 12/14/1980 Today's Date: 08/20/2024   History of Present Illness 43 yo female presents 05/19/24 for left gaze and L side weakness. 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 Intubated 9/15-9/23. EVD 9/15- 10/15; s/p shunt 10/15. S/p laparoscopy with intra-abdominal assistance ventricular-peritoneal shunt placement 10/15. PMH: HTN, ESRD on HD s/p renal and pancreatic transplants, DM, gastroparesis, multiple strokes (ischemic and hemorrhagic) with prepontine SAH, hydrocephalus requiring EVD placement    PT Comments  Pt tolerated treatment well today. Pt demonstrated improved endurance today compared to previous session. Only 1 seated rest break today. Good carryover for rollator safety education. No change in DC/DME recs at this time. PT will continue to follow.     If plan is discharge home, recommend the following: A little help with walking and/or transfers;A little help with bathing/dressing/bathroom;Assistance with cooking/housework;Assist for transportation;Help with stairs or ramp for entrance   Can travel by private vehicle        Equipment Recommendations  Rollator (4 wheels);BSC/3in1;Wheelchair (measurements PT);Wheelchair cushion (measurements PT)    Recommendations for Other Services       Precautions / Restrictions Precautions Precautions: Fall Recall of Precautions/Restrictions: Impaired Precaution/Restrictions Comments: PEG Restrictions Weight Bearing Restrictions Per Provider Order: No     Mobility  Bed Mobility Overal bed mobility: Needs Assistance Bed Mobility: Supine to Sit     Supine to sit: Used rails, Min assist     General bed mobility comments: Min A for trunk    Transfers Overall transfer level: Needs assistance Equipment used: Rollator (4  wheels) Transfers: Sit to/from Stand Sit to Stand: Supervision           General transfer comment: patient's mother providing assistance for caregiver training    Ambulation/Gait Ambulation/Gait assistance: Supervision, Contact guard assist Gait Distance (Feet): 150 Feet Assistive device: Rollator (4 wheels) Gait Pattern/deviations: Step-through pattern, Decreased stride length, Trunk flexed, Shuffle Gait velocity: Dec     General Gait Details: Pt demonstrated improved endurance today compared to previous session. Only 1 seated rest break today. Good carryover for rollator safety education.   Stairs             Wheelchair Mobility     Tilt Bed    Modified Rankin (Stroke Patients Only)       Balance Overall balance assessment: Needs assistance Sitting-balance support: No upper extremity supported, Feet supported Sitting balance-Leahy Scale: Good Sitting balance - Comments: EOB   Standing balance support: Bilateral upper extremity supported, During functional activity, Reliant on assistive device for balance Standing balance-Leahy Scale: Poor Standing balance comment: Reliant on external support.                            Communication Communication Communication: No apparent difficulties  Cognition Arousal: Alert Behavior During Therapy: WFL for tasks assessed/performed                             Following commands: Impaired Following commands impaired: Follows one step commands with increased time, Follows multi-step commands inconsistently    Cueing Cueing Techniques: Verbal cues, Gestural cues  Exercises      General Comments General comments (skin integrity, edema, etc.): VSS      Pertinent Vitals/Pain      Home Living  Prior Function            PT Goals (current goals can now be found in the care plan section) Progress towards PT goals: Progressing toward goals     Frequency    Min 2X/week      PT Plan      Co-evaluation              AM-PAC PT 6 Clicks Mobility   Outcome Measure  Help needed turning from your back to your side while in a flat bed without using bedrails?: A Little Help needed moving from lying on your back to sitting on the side of a flat bed without using bedrails?: A Little Help needed moving to and from a bed to a chair (including a wheelchair)?: A Little Help needed standing up from a chair using your arms (e.g., wheelchair or bedside chair)?: A Little Help needed to walk in hospital room?: A Little Help needed climbing 3-5 steps with a railing? : A Lot 6 Click Score: 17    End of Session Equipment Utilized During Treatment: Gait belt Activity Tolerance: Patient tolerated treatment well Patient left: with call bell/phone within reach;in bed;with family/visitor present;with nursing/sitter in room Nurse Communication: Mobility status PT Visit Diagnosis: Unsteadiness on feet (R26.81);Other abnormalities of gait and mobility (R26.89);Muscle weakness (generalized) (M62.81);Difficulty in walking, not elsewhere classified (R26.2);Other symptoms and signs involving the nervous system (R29.898)     Time: 8557-8495 PT Time Calculation (min) (ACUTE ONLY): 22 min  Charges:    $Gait Training: 8-22 mins PT General Charges $$ ACUTE PT VISIT: 1 Visit                     Sueellen NOVAK, PT, DPT Acute Rehab Services [phone]    Neiman Roots 08/20/2024, 3:22 PM

## 2024-08-20 NOTE — Progress Notes (Signed)
 [facility] Progress Note   Subjective:    Seen and examined patient at bedside. She is sleeping and her mother also at bedside. K+ now stable after yesterday's HD. BP remains variable. Next HD 12/18.  Objective Vitals:   08/20/24 0340 08/20/24 0725 08/20/24 1100 08/20/24 1110  BP: (!) 159/68 (!) 172/63 (!) 143/54 (!) 143/54  Pulse: 83 83 82   Resp: 18 17 18    Temp: 98.9 F (37.2 C) 99.1 F (37.3 C) 99.2 F (37.3 C)   TempSrc: Oral Oral Oral   SpO2: 97% 98% 94%   Weight:      Height:       Physical Exam General: Alert female in NAD Heart: RRR Lungs: CTAB, nml WOB on RA Abdomen: soft, NTND Extremities: No LE edema Dialysis Access: LU AVF   Filed Weights   08/16/24 1137 08/19/24 1345 08/19/24 1819  Weight: 55.5 kg 58.2 kg 57.2 kg   No intake or output data in the 24 hours ending 08/20/24 1214  Additional Objective Labs: Basic Metabolic Panel: Recent Labs  Lab 08/14/24 0230 08/16/24 0657 08/17/24 0613 08/19/24 0439 08/19/24 1213 08/20/24 0217  NA 137 137   < > 138 139 138  K 5.4* 5.3*   < > 6.2* 5.7* 4.2  CL 98 97*   < > 98 97* 96*  CO2 25 24   < > 26 23 29   GLUCOSE 107* 124*   < > 70 112* 82  BUN 55* 60*   < > 80* 80* 34*  CREATININE 4.96* 5.38*   < > 6.45* 6.68* 3.36*  CALCIUM  9.4 9.6   < > 9.5 10.4* 9.6  PHOS 6.2* 6.6*  --  7.9*  --   --    < > = values in this interval not displayed.   Liver Function Tests: Recent Labs  Lab 08/16/24 0657 08/19/24 0439 08/19/24 1952  AST  --   --  35  ALT  --   --  40  ALKPHOS  --   --  96  BILITOT  --   --  0.4  PROT  --   --  6.0*  ALBUMIN  3.0* 2.9* 3.6   Recent Labs  Lab 08/19/24 1952  LIPASE 34   CBC: Recent Labs  Lab 08/14/24 0230 08/16/24 0657  WBC 3.2* 4.2  HGB 9.0* 9.0*  HCT 28.7* 29.5*  MCV 102.5* 104.6*  PLT 114* 129*   Blood Culture    Component Value Date/Time   SDES CSF 06/11/2024 0920   SPECREQUEST LP 06/11/2024 0920   CULT  06/11/2024 0920    NO GROWTH 3  DAYS Performed at [facility], [address], [place], [name] [address]    REPTSTATUS 06/14/2024 FINAL 06/11/2024 0920    Cardiac Enzymes: No results for input(s): CKTOTAL, CKMB, CKMBINDEX, TROPONINI in the last 168 hours. CBG: No results for input(s): GLUCAP in the last 168 hours. Iron  Studies: No results for input(s): IRON , TIBC, TRANSFERRIN, FERRITIN in the last 72 hours. Lab Results  Component Value Date   INR 1.1 06/18/2024   INR 1.1 06/16/2024   INR 1.1 05/20/2024   Studies/Results: US  Abdomen Limited RUQ (LIVER/GB) Result Date: 08/20/2024 CLINICAL DATA:  Abdominal pain. EXAM: ULTRASOUND ABDOMEN LIMITED RIGHT UPPER QUADRANT COMPARISON:  September 26, 2023 FINDINGS: Gallbladder: A cluster of tiny gallstones is seen within the dependent portion of the gallbladder lumen. There is no evidence of gallbladder wall thickening (2.7 mm). No sonographic Murphy sign noted by  sonographer. Common bile duct: Diameter: 2.1 mm Liver: No focal lesion identified. Within normal limits in parenchymal echogenicity. Portal vein is patent on color Doppler imaging with normal direction of blood flow towards the liver. Other: None. IMPRESSION: Cholelithiasis without evidence of acute cholecystitis. Electronically Signed   By: Suzen Dials M.D.   On: 08/20/2024 01:04    Medications:  cycloSPORINE  modified (GENGRAF ) 225 mg      amLODipine   10 mg Oral Daily   carvedilol   25 mg Oral BID WC   Chlorhexidine  Gluconate Cloth  6 each Topical Q0600   cinacalcet   30 mg Oral Q M,W,F   cycloSPORINE  modified  200 mg Oral QPM   darbepoetin (ARANESP ) injection - DIALYSIS  200 mcg Subcutaneous Q Thu-1800   diclofenac  Sodium  2 g Topical QID   famotidine   10 mg Oral QHS   gabapentin   100 mg Oral Daily   hydrALAZINE   50 mg Oral Q8H    HYDROmorphone  (DILAUDID ) injection  0.25 mg Intravenous Once   lacosamide   100 mg Oral BID   multivitamin  1 tablet Oral QHS   mycophenolate   500 mg Oral  BID   pantoprazole   40 mg Oral Daily   predniSONE   15 mg Oral Q breakfast   sevelamer  carbonate  800 mg Oral TID WC   sodium zirconium cyclosilicate   10 g Oral BID    Dialysis Orders: 3:45hr, 400/A1.5, EDW 55.2kg, 2K/2Ca bath, AVF, no heparin  - Prev on Mircera 150mcg IV q 2 weeks - No VDRA  Assessment/Plan: Acute R intracranial hemorrhage: 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 Needs ongoing rehab. Doing a lot better, memory and alertness much improved.  ESRD:  HD on MWF.  HD off schedule and currently TTS. Next HD 12/18. K+ now stable, follow trend closely. HTN/volume: Systolic are variable but diastolic 60-70s which we are okay with. Appearing euvolemic. Ordered an updated CXR in AM. Cont current meds - carvedilol  25mg  BID, hydralazine  50mg  TID, and Amlodipine  10 mg daily.  UF with HD. Anemia of ESRD: Hgb 8s > 9s, transfuse prn. Continue Aranesp  200 q Thurs. 12/6 iron  sat 31%.  Secondary HPTH: CorrCa high  likely due to immobility. Phos remains elevated, added Renvela .  Continue sensipar , not on VDRA. Pth 126 on 08/07/24.  Nutrition: Alb low, supp per RD. Starting to increase PO intake. Watch K+ -has been trending up since she started eating more.  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 Dispo: Insurance declined LTAC, and SNF, appeal pending. Tolerated HD in recliner on multiple days  [name], NP [facility] 08/20/2024,12:14 PM  LOS: 93 days

## 2024-08-21 ENCOUNTER — Inpatient Hospital Stay (HOSPITAL_COMMUNITY)

## 2024-08-21 DIAGNOSIS — I619 Nontraumatic intracerebral hemorrhage, unspecified: Secondary | ICD-10-CM | POA: Diagnosis not present

## 2024-08-21 LAB — BASIC METABOLIC PANEL WITH GFR
Anion gap: 15 (ref 5–15)
BUN: 47 mg/dL — ABNORMAL HIGH (ref 6–20)
CO2: 27 mmol/L (ref 22–32)
Calcium: 9.5 mg/dL (ref 8.9–10.3)
Chloride: 95 mmol/L — ABNORMAL LOW (ref 98–111)
Creatinine, Ser: 4.89 mg/dL — ABNORMAL HIGH (ref 0.44–1.00)
GFR, Estimated: 11 mL/min — ABNORMAL LOW (ref >60)
Glucose, Bld: 73 mg/dL (ref 70–99)
Potassium: 5.2 mmol/L — ABNORMAL HIGH (ref 3.5–5.1)
Sodium: 136 mmol/L (ref 135–145)

## 2024-08-21 MED ORDER — HYDRALAZINE HCL 50 MG PO TABS
100.0000 mg | ORAL_TABLET | Freq: Three times a day (TID) | ORAL | Status: DC
  Administered 2024-08-21 – 2024-08-22 (×2): 100 mg via ORAL
  Filled 2024-08-21 (×3): qty 2

## 2024-08-21 MED ORDER — OXYCODONE HCL 5 MG PO TABS
ORAL_TABLET | ORAL | Status: AC
  Filled 2024-08-21: qty 1

## 2024-08-21 MED ORDER — LIDOCAINE-PRILOCAINE 2.5-2.5 % EX CREA: 1.0000 | TOPICAL_CREAM | CUTANEOUS | Status: DC | PRN

## 2024-08-21 MED ORDER — LIDOCAINE HCL (PF) 1 % IJ SOLN: 5.0000 mL | INTRAMUSCULAR | Status: DC | PRN

## 2024-08-21 MED ORDER — ALTEPLASE 2 MG IJ SOLR: 2.0000 mg | Freq: Once | INTRAMUSCULAR | Status: DC | PRN

## 2024-08-21 MED ORDER — SEVELAMER CARBONATE 800 MG PO TABS
1600.0000 mg | ORAL_TABLET | Freq: Three times a day (TID) | ORAL | Status: DC
  Administered 2024-08-21 – 2024-08-27 (×16): 1600 mg via ORAL
  Filled 2024-08-21 (×17): qty 2

## 2024-08-21 MED ORDER — HEPARIN SODIUM (PORCINE) 1000 UNIT/ML DIALYSIS: 1000.0000 [IU] | INTRAMUSCULAR | Status: DC | PRN

## 2024-08-21 MED ORDER — PENTAFLUOROPROP-TETRAFLUOROETH EX AERO: 1.0000 | INHALATION_SPRAY | CUTANEOUS | Status: DC | PRN

## 2024-08-21 NOTE — TOC Progression Note (Signed)
 Transition of Care [facility]) - Progression Note    Patient Details  Name: [name] [name] MRN: [id number] Date of Birth: 02-06-1981  [facility]) CM/SW Contact  [name], [name] Phone Number: 08/21/2024, 4:02 PM  Clinical Narrative:   CSW met with patient's mother to discuss DME delivery. Mother will have transportation to be at the patient's apartment after 1 tomorrow afternoon. Equipment ordered and referral given to [employer] for delivery, information sent to [facility] about delivery needing to be after 1 tomorrow and to reach out to mother ahead of time.  CSW reached out to [employer] [place] about approving patient's application for the [facility] for dialysis. Patient was already approved but let her eligibility expire in 2023, so she would need to be reviewed again; that process can take up to 21 days, they will not expedite it since she allowed it to lapse. CSW met with patient to provide update and ask if she's used her Medicaid transportation before. Patient has not. CSW attempted to reach Medicaid to get patient approved for using her Medicaid. CSW awaited on hold with DSS for 50 minutes and the call was dropped. CSW to attempt to call again.    Expected Discharge Plan: Home w Home Health Services Barriers to Discharge: No SNF bed, Insurance Authorization               Expected Discharge Plan and Services In-house Referral: Clinical Social Work   Post Acute Care Choice: Skilled Nursing Facility Living arrangements for the past 2 months: Apartment                                       Social Drivers of Health (SDOH) Interventions SDOH Screenings   Food Insecurity: Patient Unable To Answer (05/20/2024)  Housing: Unknown (07/14/2024)  Transportation Needs: Patient Unable To Answer (05/20/2024)  Utilities: Patient Unable To Answer (05/20/2024)  Social Connections: Unknown (10/04/2022)   Received from [facility]  Stress: No Stress Concern Present (04/16/2024)    Received from [facility]  Tobacco Use: Medium Risk (06/18/2024)    Readmission Risk Interventions    05/20/2024    4:16 PM 03/31/2024   11:27 AM  Readmission Risk Prevention Plan  Transportation Screening Complete Complete  Medication Review Oceanographer) Complete Complete  PCP or Specialist appointment within 3-5 days of discharge Complete   HRI or Home Care Consult Complete Complete  SW Recovery Care/Counseling Consult Complete Complete  Palliative Care Screening Complete Not Applicable  Skilled Nursing Facility Complete Not Applicable

## 2024-08-21 NOTE — Plan of Care (Signed)
°  Problem: Education: Goal: Knowledge of disease or condition will improve Outcome: Progressing Goal: Knowledge of patient specific risk factors will improve (DELETE if not current risk factor) Outcome: Progressing   Problem: Education: Goal: Knowledge of General Education information will improve Description: Including pain rating scale, medication(s)/side effects and non-pharmacologic comfort measures Outcome: Progressing

## 2024-08-21 NOTE — Progress Notes (Addendum)
 Plan for another HD treatment for tomorrow (08/22/24) to get more fluid off and to get her back on her routine HD schedule.  [name], NP [facility]

## 2024-08-21 NOTE — Progress Notes (Signed)
°   08/21/24 1157  Vitals  Temp 97.9 F (36.6 C)  Pulse Rate 84  Resp 18  BP (!) 163/65  SpO2 93 %  O2 Device Room Air  Weight 57.9 kg  Type of Weight Pre-Dialysis  Oxygen  Therapy  Patient Activity (if Appropriate) In bed  Pulse Oximetry Type Continuous  Oximetry Probe Site Changed No  Post Treatment  Dialyzer Clearance Lightly streaked  Tolerated HD Treatment Yes  AVG/AVF Arterial Site Held (minutes) 7 minutes  AVG/AVF Venous Site Held (minutes) 7 minutes

## 2024-08-21 NOTE — Progress Notes (Signed)
 Pt.came in awake and oriented Consent verified and on file. Started HD with no complications  UF removal: 1800 ml Tx duration: 3.5 hours  Access used: Right AVF Access issue: None  Tx completed and tolerated  Pressure dressing applied Endorsed to floor nurse Transported to room.  Yaiza Palazzola Rubi Antrell Tipler, RN [facility]

## 2024-08-21 NOTE — Progress Notes (Signed)
°  °  Durable Medical Equipment  (From admission, onward)           Start     Ordered   08/21/24 1502  For home use only DME lightweight manual wheelchair with seat cushion  Once       Comments: Patient suffers from weakness which impairs their ability to perform daily activities like bathing, dressing, and grooming in the home.  A walker will not resolve  issue with performing activities of daily living. A wheelchair will allow patient to safely perform daily activities. Patient is not able to propel themselves in the home using a standard weight wheelchair due to general weakness. Patient can self propel in the lightweight wheelchair. Length of need 6 months . Accessories: elevating leg rests (ELRs), wheel locks, extensions and anti-tippers.   08/21/24 1501   08/21/24 1502  For home use only DME Bedside commode  Once       Question:  Patient needs a bedside commode to treat with the following condition  Answer:  Weakness   08/21/24 1501   08/21/24 1501  For home use only DME Hospital bed  Once       Question Answer Comment  Length of Need 6 Months   The above medical condition requires: Patient requires the ability to reposition frequently   Head must be elevated greater than: 30 degrees   Bed type Semi-electric      08/21/24 1501

## 2024-08-21 NOTE — Progress Notes (Signed)
 PT Cancellation Note  Patient Details Name: [name] [name] MRN: [id number] DOB: 1980-10-01   Cancelled Treatment:    Reason Eval/Treat Not Completed: Fatigue/lethargy limiting ability to participate; reports walked with family member and plans for return to dialysis tomorrow pm.  Will continue attempts.   [name] 08/21/2024, 6:24 PM [name] [name], PT Acute Rehabilitation Services Office:[phone] 08/21/2024

## 2024-08-21 NOTE — Progress Notes (Signed)
 [name]  PROGRESS NOTE  [name] [name] FMW:[id number] DOB: Jun 27, 1981 DOA: 05/19/2024 PCP: [facility]   Brief HPI:    43-yrs-old female with past medical history of hypertension, CVA, type 1 diabetes, gastroparesis, kidney and pancreatic transplant 2014 with subsequent failure of renal transplant now on end-stage renal dialysis, anxiety, narcotic abuse presented to the hospital from dialysis unit on 05/19/2024 as code stroke.  Patient was noted to have hemorrhagic stroke.  Neurology, neurosurgery in critical care were consulted.  EVD was placed on the left side and was initially admitted to neuro ICU.  She was also intubated and had prolonged intubation.  Subsequently, patient was extubated on 05/27/2024.   Important events:  9/15 admitted with ICH plus IVH, EVD was placed for hydrocephalus, intubated 9/22 palliative care meeting.  Family is insistent that they want full scope of care 9/23 Extubated 10/1 -developed fevers pancultures drawn and broad-spectrum antibiotics started 10/2 became afebrile after starting antibiotics, did not tolerate raising EVD 10/6 EVD was raised from 10 to 30 cm water , tolerating well, remained afebrile 10/7 EVD is at 30 cm water , CT scan will be done tomorrow morning, no overnight issues 10/8 she became somnolent, CT head showed increasing hydrocephalus, EVD was titrated down to 10 cm of water  with improvement in mental status 10/15 VP shunt placement 10/17 transferred to progressive unit with [name]  11/18-medically stable.  Awaiting for disposition.  Insurance declined LTAC and skilled nursing facility, appeal pending. 12/3-tube feeding changed to nocturnal tube feeds, started on p.o. diet 12/9 nocturnal tube feeds d/c'd    Assessment/Plan:   Acute right parietotemporal intraparenchymal hemorrhage / Intraventricular hemorrhage: Uncontrolled hypertension: Obstructive hydrocephalus s/p EVD changed to VP shunt: Seizure disorder S/p VP  shunt placement on 06/18/2024 by Dr. Mavis.  Continue Vimpat .   Neurosurgery intermittently followed the patient during hospitalization.     ESRD on dialysis: History of failed renal transplant: Secondary hyperparathyroidism: Hyperkalemia  Nephrology on board.  Continue CellCept  and prednisone .   On Monday, Wednesday, Friday hemodialysis schedule.   Can resume outpatient HD center at discharge ([facility] on MWF )  Right Upper Quadrant Pain RUQ US  with cholelithiasis  Will monitor, resolved today  If persistent warrants additional workup   Hyponatremia improved  History of pancreatic/kidney transplantation:  Continue cyclosporine , mycophenolate , and prednisone  (will continue 15 mg dose based on discussion with renal)   Hypertension:  -Continue Coreg  25 mg p.o. twice daily -amlodipine  10 mg  - hydralazine  (increase to 100 q8)  - not quite at goal, will adjust gradually as needed - losartan  d/c'd with hyperkalemia  Dizziness -resolved   Anemia of chronic disease:  Stable Transfuse if Hb below 7.0   Type 1 diabetes with vasculopathy/gastroparesis:  Not on long-term insulin  anymore due to pancreatic transplant.  A1c 3.9 Follow BG intermittently on BMP    Moderate protein calorie malnutrition/dysphagia:  S/p PEG She's taking PO well, tube feeds d/c'd   Debility/deconditioning/disposition/goals of care:  Palliative care had seen the patient during hospitalization.  Palliative care has signed off 10/17.  Disposition pending.  TOC on board.    Continuing to work on blood pressure.  RUQ pain has improved.  DVT prophylaxis: SCDs  Medications     amLODipine   10 mg Oral Daily   carvedilol   25 mg Oral BID WC   Chlorhexidine  Gluconate Cloth  6 each Topical Q0600   cinacalcet   30 mg Oral Q M,W,F   cycloSPORINE  modified  200 mg Oral QPM   darbepoetin (  ARANESP ) injection - DIALYSIS  200 mcg Subcutaneous Q Thu-1800   diclofenac  Sodium  2 g Topical QID   famotidine   10 mg  Oral QHS   gabapentin   100 mg Oral Daily   hydrALAZINE   75 mg Oral Q8H    HYDROmorphone  (DILAUDID ) injection  0.25 mg Intravenous Once   lacosamide   100 mg Oral BID   multivitamin  1 tablet Oral QHS   mycophenolate   500 mg Oral BID   pantoprazole   40 mg Oral Daily   predniSONE   15 mg Oral Q breakfast   sevelamer  carbonate  1,600 mg Oral TID WC   sodium zirconium cyclosilicate   10 g Oral Daily     Data Reviewed:   CBG:  No results for input(s): GLUCAP in the last 168 hours.   SpO2: 92 % O2 Flow Rate (L/min): 100 L/min FiO2 (%): 40 %    Vitals:   08/21/24 1156 08/21/24 1157 08/21/24 1359 08/21/24 1521  BP:  (!) 163/65 (!) 174/68 (!) 179/72  Pulse:  84 81 84  Resp:  18 17 17   Temp:  97.9 F (36.6 C) 99.2 F (37.3 C) 99.6 F (37.6 C)  TempSrc:   Oral Oral  SpO2:  93% 98% 92%  Weight: 57.9 kg 57.9 kg    Height:          Data Reviewed:  Basic Metabolic Panel: Recent Labs  Lab 08/16/24 0657 08/17/24 0613 08/18/24 0454 08/19/24 0439 08/19/24 1213 08/20/24 0217 08/21/24 0248  NA 137   < > 137 138 139 138 136  K 5.3*   < > 5.7* 6.2* 5.7* 4.2 5.2*  CL 97*   < > 98 98 97* 96* 95*  CO2 24   < > 25 26 23 29 27   GLUCOSE 124*   < > 81 70 112* 82 73  BUN 60*   < > 61* 80* 80* 34* 47*  CREATININE 5.38*   < > 5.08* 6.45* 6.68* 3.36* 4.89*  CALCIUM  9.6   < > 10.0 9.5 10.4* 9.6 9.5  PHOS 6.6*  --   --  7.9*  --   --   --    < > = values in this interval not displayed.    CBC: Recent Labs  Lab 08/16/24 0657  WBC 4.2  HGB 9.0*  HCT 29.5*  MCV 104.6*  PLT 129*    LFT Recent Labs  Lab 08/16/24 0657 08/19/24 0439 08/19/24 1952  AST  --   --  35  ALT  --   --  40  ALKPHOS  --   --  96  BILITOT  --   --  0.4  PROT  --   --  6.0*  ALBUMIN  3.0* 2.9* 3.6     Antibiotics: Anti-infectives (From admission, onward)    Start     Dose/Rate Route Frequency Ordered Stop   07/08/24 1530  fluconazole  (DIFLUCAN ) tablet 150 mg        150 mg Oral  Once 07/08/24  1437 07/08/24 1544   06/18/24 2030  ceFAZolin  (ANCEF ) IVPB 2g/100 mL premix  Status:  Discontinued        2 g 200 mL/hr over 30 Minutes Intravenous Every 8 hours 06/18/24 1454 06/19/24 1608   06/18/24 0600  ceFAZolin  (ANCEF ) IVPB 2g/100 mL premix        2 g 200 mL/hr over 30 Minutes Intravenous On call to O.R. 06/15/24 1152 06/18/24 1230   06/06/24 1400  piperacillin -tazobactam (ZOSYN ) IVPB 2.25  g        2.25 g 100 mL/hr over 30 Minutes Intravenous Every 8 hours 06/06/24 1017 06/09/24 0538   06/06/24 1200  vancomycin  (VANCOREADY) IVPB 500 mg/100 mL        500 mg 100 mL/hr over 60 Minutes Intravenous Every M-W-F (Hemodialysis) 06/04/24 1745 06/06/24 1900   06/04/24 1845  vancomycin  (VANCOCIN ) IVPB 1000 mg/200 mL premix        1,000 mg 200 mL/hr over 60 Minutes Intravenous  Once 06/04/24 1745 06/04/24 1915   06/04/24 1830  ceFEPIme  (MAXIPIME ) 2 g in sodium chloride  0.9 % 100 mL IVPB  Status:  Discontinued        2 g 200 mL/hr over 30 Minutes Intravenous Every M-W-F (Hemodialysis) 06/04/24 1743 06/06/24 1017   05/27/24 1015  cefTRIAXone  (ROCEPHIN ) 2 g in sodium chloride  0.9 % 100 mL IVPB        2 g 200 mL/hr over 30 Minutes Intravenous Every 24 hours 05/27/24 0918 05/29/24 1014   05/23/24 1530  ceFEPIme  (MAXIPIME ) 1 g in sodium chloride  0.9 % 100 mL IVPB  Status:  Discontinued        1 g 200 mL/hr over 30 Minutes Intravenous Every 24 hours 05/23/24 1433 05/27/24 0918   05/22/24 2200  Ampicillin -Sulbactam (UNASYN ) 3 g in sodium chloride  0.9 % 100 mL IVPB  Status:  Discontinued        3 g 200 mL/hr over 30 Minutes Intravenous Every 24 hours 05/22/24 0834 05/23/24 1433   05/22/24 0930  Ampicillin -Sulbactam (UNASYN ) 3 g in sodium chloride  0.9 % 100 mL IVPB        3 g 200 mL/hr over 30 Minutes Intravenous  Once 05/22/24 0834 05/22/24 1900        CONSULTS neurosurgery, palliative care  Code Status: Full code  Family Communication:      Subjective   Mom at bedside No  complaints   Objective    Physical Examination:  General: No acute distress. Cardiovascular: RRR Lungs: unlabored Abdomen: Soft, nontender, nondistended - no RUQ TTP Neurological: Alert. Moves all extremities 4 with equal strength. Cranial nerves II through XII grossly intact. Extremities: No clubbing or cyanosis. No edema.   Meliton Monte   [name] If 7PM-7AM, please contact night-coverage at [email or web address], Office  [phone]   08/21/2024, 3:38 PM  LOS: 94 days

## 2024-08-21 NOTE — Progress Notes (Signed)
 [facility] Progress Note   Subjective:    Seen and examined patient on HD. Tolerating UFG 2L and BP is 148/65. No acute issues this morning.  Objective Vitals:   08/21/24 0905 08/21/24 0935 08/21/24 0951 08/21/24 1035  BP: (!) 165/69 (!) 148/65 (!) 153/66 (!) 153/62  Pulse: 86 83 83 84  Resp: 16 17 (!) 21 20  Temp:      TempSrc:      SpO2: 93% 95% 97% 92%  Weight:      Height:       Physical Exam General: Alert female in NAD Heart: RRR Lungs: CTAB, nml WOB on RA Abdomen: soft, NTND Extremities: No LE edema Dialysis Access: LU AVF   Filed Weights   08/19/24 1345 08/19/24 1819 08/21/24 0748  Weight: 58.2 kg 57.2 kg 58.1 kg   No intake or output data in the 24 hours ending 08/21/24 1057  Additional Objective Labs: Basic Metabolic Panel: Recent Labs  Lab 08/16/24 0657 08/17/24 0613 08/19/24 0439 08/19/24 1213 08/20/24 0217 08/21/24 0248  NA 137   < > 138 139 138 136  K 5.3*   < > 6.2* 5.7* 4.2 5.2*  CL 97*   < > 98 97* 96* 95*  CO2 24   < > 26 23 29 27   GLUCOSE 124*   < > 70 112* 82 73  BUN 60*   < > 80* 80* 34* 47*  CREATININE 5.38*   < > 6.45* 6.68* 3.36* 4.89*  CALCIUM  9.6   < > 9.5 10.4* 9.6 9.5  PHOS 6.6*  --  7.9*  --   --   --    < > = values in this interval not displayed.   Liver Function Tests: Recent Labs  Lab 08/16/24 0657 08/19/24 0439 08/19/24 1952  AST  --   --  35  ALT  --   --  40  ALKPHOS  --   --  96  BILITOT  --   --  0.4  PROT  --   --  6.0*  ALBUMIN  3.0* 2.9* 3.6   Recent Labs  Lab 08/19/24 1952  LIPASE 34   CBC: Recent Labs  Lab 08/16/24 0657  WBC 4.2  HGB 9.0*  HCT 29.5*  MCV 104.6*  PLT 129*   Blood Culture    Component Value Date/Time   SDES CSF 06/11/2024 0920   SPECREQUEST LP 06/11/2024 0920   CULT  06/11/2024 0920    NO GROWTH 3 DAYS Performed at [facility], [address] [place], [name] [address]    REPTSTATUS 06/14/2024 FINAL 06/11/2024 0920    Cardiac Enzymes: No  results for input(s): CKTOTAL, CKMB, CKMBINDEX, TROPONINI in the last 168 hours. CBG: No results for input(s): GLUCAP in the last 168 hours. Iron  Studies: No results for input(s): IRON , TIBC, TRANSFERRIN, FERRITIN in the last 72 hours. Lab Results  Component Value Date   INR 1.1 06/18/2024   INR 1.1 06/16/2024   INR 1.1 05/20/2024   Studies/Results: US  Abdomen Limited RUQ (LIVER/GB) Result Date: 08/20/2024 CLINICAL DATA:  Abdominal pain. EXAM: ULTRASOUND ABDOMEN LIMITED RIGHT UPPER QUADRANT COMPARISON:  September 26, 2023 FINDINGS: Gallbladder: A cluster of tiny gallstones is seen within the dependent portion of the gallbladder lumen. There is no evidence of gallbladder wall thickening (2.7 mm). No sonographic Murphy sign noted by sonographer. Common bile duct: Diameter: 2.1 mm Liver: No focal lesion identified. Within normal limits in parenchymal echogenicity. Portal vein is patent  on color Doppler imaging with normal direction of blood flow towards the liver. Other: None. IMPRESSION: Cholelithiasis without evidence of acute cholecystitis. Electronically Signed   By: Suzen Dials M.D.   On: 08/20/2024 01:04    Medications:  cycloSPORINE  modified (GENGRAF ) 225 mg      amLODipine   10 mg Oral Daily   carvedilol   25 mg Oral BID WC   Chlorhexidine  Gluconate Cloth  6 each Topical Q0600   cinacalcet   30 mg Oral Q M,W,F   cycloSPORINE  modified  200 mg Oral QPM   darbepoetin (ARANESP ) injection - DIALYSIS  200 mcg Subcutaneous Q Thu-1800   diclofenac  Sodium  2 g Topical QID   famotidine   10 mg Oral QHS   gabapentin   100 mg Oral Daily   hydrALAZINE   75 mg Oral Q8H    HYDROmorphone  (DILAUDID ) injection  0.25 mg Intravenous Once   lacosamide   100 mg Oral BID   multivitamin  1 tablet Oral QHS   mycophenolate   500 mg Oral BID   pantoprazole   40 mg Oral Daily   predniSONE   15 mg Oral Q breakfast   sevelamer  carbonate  800 mg Oral TID WC   sodium zirconium cyclosilicate   10  g Oral Daily    Dialysis Orders: 3:45hr, 400/A1.5, EDW 55.2kg, 2K/2Ca bath, AVF, no heparin  - Prev on Mircera 150mcg IV q 2 weeks - No VDRA  Assessment/Plan: Acute R intracranial hemorrhage: 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 Needs ongoing rehab. Doing a lot better, memory and alertness much improved.  ESRD:  HD on MWF.  HD off schedule and currently TTS. On HD. K+ levels in upper 5s, now on Lokelma  daily. Follow trend. HTN/volume: Bps variable: SBPs intermittently elevated but DBPs ranging 60-70s. From a renal standpoint, we are okay with SBP 140-160 as long as DBP is < 70. Continue carvedilol  25mg  BID, hydralazine  75mg  TID, and Amlodipine  10 mg daily.  Okay to raise Hydralazine  to 100mg  TID if needed. CXR pending. UF as tolerated with HD. Anemia of ESRD: Hgb 8s > 9s, transfuse prn. Continue Aranesp  200 q Thurs. 12/6 iron  sat 31%.  Secondary HPTH: CorrCa high  likely due to immobility. Phos remains elevated, raised Renvela  today.  Continue sensipar , not on VDRA. Pth 126 on 08/07/24.  Nutrition: Alb low, supp per RD. Starting to increase PO intake. Watch K+ -has been trending up since she started eating more.  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 Dispo: Insurance declined LTAC, and SNF, appeal pending. Tolerated HD in recliner on multiple days  [name], NP [facility] 08/21/2024,10:57 AM  LOS: 94 days

## 2024-08-21 NOTE — Progress Notes (Signed)
 PT Cancellation Note  Patient Details Name: [name] [name] MRN: [id number] DOB: [birth year]   Cancelled Treatment:    Reason Eval/Treat Not Completed: Patient at procedure or test/unavailable; in HD, will follow up as schedule permits.    Montie Portal 08/21/2024, 10:02 AM Micheline Portal, PT Acute Rehabilitation Services [phone] 08/21/2024

## 2024-08-22 DIAGNOSIS — I619 Nontraumatic intracerebral hemorrhage, unspecified: Secondary | ICD-10-CM | POA: Diagnosis not present

## 2024-08-22 LAB — BASIC METABOLIC PANEL WITH GFR
Anion gap: 14 (ref 5–15)
BUN: 27 mg/dL — ABNORMAL HIGH (ref 6–20)
CO2: 27 mmol/L (ref 22–32)
Calcium: 10.4 mg/dL — ABNORMAL HIGH (ref 8.9–10.3)
Chloride: 94 mmol/L — ABNORMAL LOW (ref 98–111)
Creatinine, Ser: 3.64 mg/dL — ABNORMAL HIGH (ref 0.44–1.00)
GFR, Estimated: 15 mL/min — ABNORMAL LOW
Glucose, Bld: 147 mg/dL — ABNORMAL HIGH (ref 70–99)
Potassium: 4.2 mmol/L (ref 3.5–5.1)
Sodium: 135 mmol/L (ref 135–145)

## 2024-08-22 MED ORDER — CHLORHEXIDINE GLUCONATE CLOTH 2 % EX PADS
6.0000 | MEDICATED_PAD | Freq: Every day | CUTANEOUS | Status: DC
  Administered 2024-08-23 – 2024-08-24 (×2): 6 via TOPICAL

## 2024-08-22 MED ORDER — HYDRALAZINE HCL 50 MG PO TABS
100.0000 mg | ORAL_TABLET | Freq: Three times a day (TID) | ORAL | Status: DC
  Administered 2024-08-22 – 2024-08-26 (×12): 100 mg via ORAL
  Filled 2024-08-22 (×13): qty 2

## 2024-08-22 MED ORDER — BUTALBITAL-APAP-CAFFEINE 50-325-40 MG PO TABS
2.0000 | ORAL_TABLET | Freq: Once | ORAL | Status: AC
  Administered 2024-08-22: 2 via ORAL
  Filled 2024-08-22: qty 2

## 2024-08-22 MED ORDER — TRAZODONE HCL 50 MG PO TABS
50.0000 mg | ORAL_TABLET | Freq: Every day | ORAL | Status: DC
  Administered 2024-08-22 – 2024-08-26 (×4): 50 mg via ORAL
  Filled 2024-08-22 (×5): qty 1

## 2024-08-22 NOTE — Progress Notes (Signed)
 Pt. Still continues to refuse CHG bath, but says she will do it today.

## 2024-08-22 NOTE — Progress Notes (Signed)
 Physical Therapy Treatment Patient Details Name: [name] [name] MRN: [id number] DOB: 02-Aug-1981 Today's Date: 08/22/2024   History of Present Illness 43 yo female presents 05/19/24 for left gaze and L side weakness. 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 Intubated 9/15-9/23. EVD 9/15- 10/15; s/p shunt 10/15. S/p laparoscopy with intra-abdominal assistance ventricular-peritoneal shunt placement 10/15. PMH: HTN, ESRD on HD s/p renal and pancreatic transplants, DM, gastroparesis, multiple strokes (ischemic and hemorrhagic) with prepontine SAH, hydrocephalus requiring EVD placement    PT Comments  Pt is making steady progress towards her acute PT goals demonstrated by advanced functional mobility with decreased physical assistance. She increased gait distance ambulating ~164ft three times using rollator with two seated rest breaks. Pt required CGA for the majority of the session, increasing to minA d/t 3 mild LOB when she removed RUE support from rollator handle. Observed pt had difficulty with turning taking multiple short steps to catch up. Pt will benefit from acute skilled PT to increase her independence and safety with mobility to allow discharge home with HHPT.    If plan is discharge home, recommend the following: A little help with walking and/or transfers;A little help with bathing/dressing/bathroom;Assistance with cooking/housework;Assist for transportation;Help with stairs or ramp for entrance   Can travel by private vehicle        Equipment Recommendations  Rollator (4 wheels);BSC/3in1;Wheelchair (measurements PT);Wheelchair cushion (measurements PT)    Recommendations for Other Services       Precautions / Restrictions Precautions Precautions: Fall Recall of Precautions/Restrictions: Impaired Restrictions Weight Bearing Restrictions Per Provider Order: No      Mobility  Bed Mobility Overal bed mobility: Needs Assistance Bed Mobility: Supine to Sit     Supine to sit: Contact guard     General bed mobility comments: Pt sat up on L side of bed with increased time. She brought BLE off EOB. Pulled on PT to elevate trunk. Cues for scooting fwd til feet flat.    Transfers Overall transfer level: Needs assistance Equipment used: Rollator (4 wheels) Transfers: Sit to/from Stand Sit to Stand: Contact guard assist           General transfer comment: Pt stood from lowest bed height. Cue to lock rollator breaks and proper hand placement. She powered up without physical assist. CGA for safety. Pt took 2 seated rest breaks on rollator seat. She locked/unlocked break and turned around with short slow steps. Pt slightly unsteady while turning requiring CGA for stability. Good eccentric control.    Ambulation/Gait Ambulation/Gait assistance: Contact guard assist Gait Distance (Feet): 150 Feet (x3, two seated rest breaks in between bouts) Assistive device: Rollator (4 wheels) Gait Pattern/deviations: Step-through pattern, Decreased stride length, Drifts right/left Gait velocity: decr Gait velocity interpretation: <1.8 ft/sec, indicate of risk for recurrent falls   General Gait Details: Pt ambulated with short slow steady steps. She maintained upright posture with good proximity to rollator. Pt self-identified when she needed a seated rest break and would pause locking/unlocking breaks appropriately. While ambulating pt had 3 minor LOB requiring up to minA to stabilize. Each incident occured when pt let go of rollator handle with R hand to reach for her hand, becoming unsteady towards the right. Instructed pt to stop and pause if she needed to let go of AD so she would be statically stable and have unilat support still. Pt has difficulty turning taking increased time and multiple short steps  to catch up.   Stairs             Wheelchair  Mobility     Tilt Bed    Modified Rankin (Stroke Patients Only) Modified Rankin (Stroke Patients Only) Pre-Morbid Rankin Score: Moderately severe disability Modified Rankin: Moderately severe disability     Balance Overall balance assessment: Needs assistance Sitting-balance support: No upper extremity supported, Feet supported Sitting balance-Leahy Scale: Good Sitting balance - Comments: Pt sat EOB with supervision. Breakfast tray set up infront of her   Standing balance support: Bilateral upper extremity supported, During functional activity, Reliant on assistive device for balance Standing balance-Leahy Scale: Poor Standing balance comment: Pt dependent on rollator and CGA-minA from PT. She was unsteady during turning and had 3 minor LOB when only unilat support on AD.                            Communication Communication Communication: No apparent difficulties  Cognition Arousal: Alert Behavior During Therapy: WFL for tasks assessed/performed   PT - Cognitive impairments: Safety/Judgement, Problem solving, Sequencing                       PT - Cognition Comments: Pt with some left inattention, requiring cues to safely navigate environment. She took one hand off rollator a couple of times and became unsteady with LOB requiring assist to stabilize. Instructed pt to stop and stand statically with unilat support while adjusting with her opposite hand as needed. Following commands: Impaired Following commands impaired: Follows one step commands with increased time, Follows multi-step commands inconsistently    Cueing Cueing Techniques: Verbal cues, Gestural cues  Exercises      General Comments General comments (skin integrity, edema, etc.): Mom present and supportive throughout session.      Pertinent Vitals/Pain Pain Assessment Pain Assessment: No/denies pain    Home Living                          Prior Function            PT  Goals (current goals can now be found in the care plan section) Acute Rehab PT Goals Patient Stated Goal: Return Home PT Goal Formulation: With patient/family Time For Goal Achievement: 09/05/24 Potential to Achieve Goals: Good Progress towards PT goals: Progressing toward goals;Goals updated    Frequency    Min 2X/week      PT Plan      Co-evaluation              AM-PAC PT 6 Clicks Mobility   Outcome Measure  Help needed turning from your back to your side while in a flat bed without using bedrails?: A Little Help needed moving from lying on your back to sitting on the side of a flat bed without using bedrails?: A Little Help needed moving to and from a bed to a chair (including a wheelchair)?: A Little Help needed standing up from a chair using your arms (e.g., wheelchair or bedside chair)?: A Little Help needed to walk in hospital room?: A Little Help needed climbing 3-5 steps with a railing? : A Lot 6 Click Score: 17    End of Session Equipment Utilized During Treatment: Gait belt Activity Tolerance: Patient tolerated treatment well Patient left: in bed;with call bell/phone within reach;with bed alarm set;with family/visitor present Nurse Communication: Mobility status PT Visit Diagnosis: Unsteadiness on feet (  R26.81);Other abnormalities of gait and mobility (R26.89);Muscle weakness (generalized) (M62.81);Difficulty in walking, not elsewhere classified (R26.2);Other symptoms and signs involving the nervous system (R29.898)     Time: 9253-9193 PT Time Calculation (min) (ACUTE ONLY): 20 min  Charges:    $Gait Training: 8-22 mins PT General Charges $$ ACUTE PT VISIT: 1 Visit                     Randall SAUNDERS, PT, DPT Acute Rehabilitation Services Office: [phone] Secure Chat Preferred  Delon CHRISTELLA Callander 08/22/2024, 8:53 AM

## 2024-08-22 NOTE — Progress Notes (Signed)
 " [facility] Progress Note   Subjective:  Seen in room - feels ok. CXR yesterday with pulm edema. D/t high census - will just keep her on TTS schedule this week - possibly back to MWF starting next week - for HD tomorrow. No CP/dyspnea.   Objective Vitals:   08/21/24 1946 08/21/24 2319 08/22/24 0352 08/22/24 0716  BP: (!) 159/64 (!) 157/72 (!) 172/72 (!) 178/72  Pulse: 88 81 84 82  Resp: 18 18 18 18   Temp: 99.5 F (37.5 C) 98.1 F (36.7 C) 97.7 F (36.5 C) 97.9 F (36.6 C)  TempSrc: Oral Oral Oral Oral  SpO2: 94% 93% 91% 92%  Weight:      Height:       Physical Exam General: Improving, NAD. Room air Heart: RRR Lungs: CTAB Abdomen: soft Extremities: no LE edema Dialysis Access:  AVF +t/b  Additional Objective Labs: Basic Metabolic Panel: Recent Labs  Lab 08/16/24 0657 08/17/24 0613 08/19/24 0439 08/19/24 1213 08/20/24 0217 08/21/24 0248  NA 137   < > 138 139 138 136  K 5.3*   < > 6.2* 5.7* 4.2 5.2*  CL 97*   < > 98 97* 96* 95*  CO2 24   < > 26 23 29 27   GLUCOSE 124*   < > 70 112* 82 73  BUN 60*   < > 80* 80* 34* 47*  CREATININE 5.38*   < > 6.45* 6.68* 3.36* 4.89*  CALCIUM  9.6   < > 9.5 10.4* 9.6 9.5  PHOS 6.6*  --  7.9*  --   --   --    < > = values in this interval not displayed.   Liver Function Tests: Recent Labs  Lab 08/16/24 0657 08/19/24 0439 08/19/24 1952  AST  --   --  35  ALT  --   --  40  ALKPHOS  --   --  96  BILITOT  --   --  0.4  PROT  --   --  6.0*  ALBUMIN  3.0* 2.9* 3.6   Recent Labs  Lab 08/19/24 1952  LIPASE 34   CBC: Recent Labs  Lab 08/16/24 0657  WBC 4.2  HGB 9.0*  HCT 29.5*  MCV 104.6*  PLT 129*   Studies/Results: DG CHEST PORT 1 VIEW Result Date: 08/21/2024 EXAM: 1 VIEW(S) XRAY OF THE CHEST 08/21/2024 06:13:40 AM COMPARISON: 07/19/2024 CLINICAL HISTORY: Volume excess FINDINGS: VP shunt catheter projects over the right side of the chest. Multiple wires and leads project over the chest on the frontal  radiograph. Percutaneous gastrostomy tube in upper abdomen. Moderate cardiomegaly. Mild pulmonary venous congestion. Right breast calcification of 1.5 cm is unchanged. IMPRESSION: 1. Moderate cardiomegaly with mild pulmonary venous congestion. Electronically signed by: Rockey Kilts MD 08/21/2024 04:43 PM EST RP Workstation: [id number]   Medications:  cycloSPORINE  modified (GENGRAF ) 225 mg      amLODipine   10 mg Oral Daily   carvedilol   25 mg Oral BID WC   Chlorhexidine  Gluconate Cloth  6 each Topical Q0600   Chlorhexidine  Gluconate Cloth  6 each Topical Q0600   cinacalcet   30 mg Oral Q M,W,F   cycloSPORINE  modified  200 mg Oral QPM   darbepoetin (ARANESP ) injection - DIALYSIS  200 mcg Subcutaneous Q Thu-1800   diclofenac  Sodium  2 g Topical QID   famotidine   10 mg Oral QHS   gabapentin   100 mg Oral Daily   hydrALAZINE   100 mg Oral Q8H    HYDROmorphone  (DILAUDID )  injection  0.25 mg Intravenous Once   lacosamide   100 mg Oral BID   multivitamin  1 tablet Oral QHS   mycophenolate   500 mg Oral BID   pantoprazole   40 mg Oral Daily   predniSONE   15 mg Oral Q breakfast   sevelamer  carbonate  1,600 mg Oral TID WC   sodium zirconium cyclosilicate   10 g Oral Daily    Dialysis Orders 3:45hr, 400/A1.5, EDW 55.2kg, 2K/2Ca bath, AVF, no heparin  - Prev on Mircera 150mcg IV q 2 weeks - No VDRA   Assessment/Plan: Acute R intracranial hemorrhage: 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 Needs ongoing rehab. Doing a lot better, memory and alertness much improved.  ESRD: Usual MWF schedule - off at some point and getting HD on TTS schedule this week - for HD tomorrow. Try to get back on usual schedule at some point next week. Hyperkalemia: Now on Lokelma  daily - fine. HTN/volume: BP higher lately, pulm edema on CXR. Continue carvedilol  25mg  BID, hydralazine  100mg  TID, and amlodipine  10 mg daily. ^ UFG with HD tomorrow. Anemia of ESRD: Hgb 8s > 9s, transfuse prn. Continue Aranesp  200 q Thurs. 12/6 iron   sat 31%.  Secondary HPTH: Ca ok, last Phos high and sevelamer  dose increased. Continue sensipar , not on VDRA (PTH 126) Nutrition: Alb low, supp per RD. Starting to increase PO intake. 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 Dispo: Insurance declined LTAC, and SNF, appeal pending. Working to go home. Tolerated HD in recliner on multiple days.   FYI -- Holiday Schedule for next week Usual MWF schedule -> Sun (21), Tues (23), Friday (26) Usual TTS schedule -> Mon (22), Wed (24), Sat (27)   Katie Sammuel Blick, PA-C 08/22/2024, 10:20 AM  [facility]    "

## 2024-08-22 NOTE — Progress Notes (Signed)
 [name]  PROGRESS NOTE  [name] [name] FMW:[id number] DOB: 1981-08-08 DOA: 05/19/2024 PCP: [facility]   Brief HPI:    43-yrs-old female with past medical history of hypertension, CVA, type 1 diabetes, gastroparesis, kidney and pancreatic transplant 2014 with subsequent failure of renal transplant now on end-stage renal dialysis, anxiety, narcotic abuse presented to the hospital from dialysis unit on 05/19/2024 as code stroke.  Patient was noted to have hemorrhagic stroke.  Neurology, neurosurgery in critical care were consulted.  EVD was placed on the left side and was initially admitted to neuro ICU.  She was also intubated and had prolonged intubation.  Subsequently, patient was extubated on 05/27/2024.   Important events:  9/15 admitted with ICH plus IVH, EVD was placed for hydrocephalus, intubated 9/22 palliative care meeting.  Family is insistent that they want full scope of care 9/23 Extubated 10/1 -developed fevers pancultures drawn and broad-spectrum antibiotics started 10/2 became afebrile after starting antibiotics, did not tolerate raising EVD 10/6 EVD was raised from 10 to 30 cm water , tolerating well, remained afebrile 10/7 EVD is at 30 cm water , CT scan will be done tomorrow morning, no overnight issues 10/8 she became somnolent, CT head showed increasing hydrocephalus, EVD was titrated down to 10 cm of water  with improvement in mental status 10/15 VP shunt placement 10/17 transferred to progressive unit with [name]  11/18-medically stable.  Awaiting for disposition.  Insurance declined LTAC and skilled nursing facility, appeal pending. 12/3-tube feeding changed to nocturnal tube feeds, started on p.o. diet 12/9 nocturnal tube feeds d/c'd    Assessment/Plan:   Acute right parietotemporal intraparenchymal hemorrhage / Intraventricular hemorrhage: Uncontrolled hypertension: Obstructive hydrocephalus s/p EVD changed to VP shunt: Seizure disorder S/p VP  shunt placement on 06/18/2024 by Dr. Mavis.  Continue Vimpat .   Neurosurgery intermittently followed the patient during hospitalization.     ESRD on dialysis: History of failed renal transplant: Secondary hyperparathyroidism: Hyperkalemia  Nephrology on board.  Continue CellCept  and prednisone .   On Monday, Wednesday, Friday hemodialysis schedule.   Can resume outpatient HD center at discharge ([facility] on MWF )  Headache Seems mild, trial fioricet  Trial HA cocktail if that doesn't work  Right Upper Quadrant Pain RUQ US  with cholelithiasis  resolved  Hyponatremia improved  History of pancreatic/kidney transplantation:  Continue cyclosporine , mycophenolate , and prednisone  (will continue 15 mg dose based on discussion with renal)   Hypertension:  -Continue Coreg  25 mg p.o. twice daily -amlodipine  10 mg  - hydralazine  (increase to 100 q8)  - not quite at goal, will adjust gradually as needed - losartan  d/c'd with hyperkalemia  Dizziness -resolved   Anemia of chronic disease:  Stable Transfuse if Hb below 7.0   Type 1 diabetes with vasculopathy/gastroparesis:  Not on long-term insulin  anymore due to pancreatic transplant.  A1c 3.9 Follow BG intermittently on BMP    Moderate protein calorie malnutrition/dysphagia:  S/p PEG She's taking PO well, tube feeds d/c'd   Insomnia Trial trazodone   Debility/deconditioning/disposition/goals of care:  Palliative care had seen the patient during hospitalization.  Palliative care has signed off 10/17.  Disposition pending.  TOC on board.    Continuing to work on blood pressure.  RUQ pain has improved.  DVT prophylaxis: SCDs  Medications     amLODipine   10 mg Oral Daily   butalbital -acetaminophen -caffeine   2 tablet Oral Once   carvedilol   25 mg Oral BID WC   Chlorhexidine  Gluconate Cloth  6 each Topical Q0600   Chlorhexidine  Gluconate Cloth  6 each Topical Q0600   cinacalcet   30 mg Oral Q M,W,F   cycloSPORINE   modified  200 mg Oral QPM   darbepoetin (ARANESP ) injection - DIALYSIS  200 mcg Subcutaneous Q Thu-1800   diclofenac  Sodium  2 g Topical QID   famotidine   10 mg Oral QHS   gabapentin   100 mg Oral Daily   hydrALAZINE   100 mg Oral Q8H    HYDROmorphone  (DILAUDID ) injection  0.25 mg Intravenous Once   lacosamide   100 mg Oral BID   multivitamin  1 tablet Oral QHS   mycophenolate   500 mg Oral BID   pantoprazole   40 mg Oral Daily   predniSONE   15 mg Oral Q breakfast   sevelamer  carbonate  1,600 mg Oral TID WC   sodium zirconium cyclosilicate   10 g Oral Daily   traZODone   50 mg Oral QHS     Data Reviewed:   CBG:  No results for input(s): GLUCAP in the last 168 hours.   SpO2: 92 % O2 Flow Rate (L/min): 100 L/min FiO2 (%): 40 %    Vitals:   08/21/24 1946 08/21/24 2319 08/22/24 0352 08/22/24 0716  BP: (!) 159/64 (!) 157/72 (!) 172/72 (!) 178/72  Pulse: 88 81 84 82  Resp: 18 18 18 18   Temp: 99.5 F (37.5 C) 98.1 F (36.7 C) 97.7 F (36.5 C) 97.9 F (36.6 C)  TempSrc: Oral Oral Oral Oral  SpO2: 94% 93% 91% 92%  Weight:      Height:          Data Reviewed:  Basic Metabolic Panel: Recent Labs  Lab 08/16/24 0657 08/17/24 0613 08/19/24 0439 08/19/24 1213 08/20/24 0217 08/21/24 0248 08/22/24 0930  NA 137   < > 138 139 138 136 135  K 5.3*   < > 6.2* 5.7* 4.2 5.2* 4.2  CL 97*   < > 98 97* 96* 95* 94*  CO2 24   < > 26 23 29 27 27   GLUCOSE 124*   < > 70 112* 82 73 147*  BUN 60*   < > 80* 80* 34* 47* 27*  CREATININE 5.38*   < > 6.45* 6.68* 3.36* 4.89* 3.64*  CALCIUM  9.6   < > 9.5 10.4* 9.6 9.5 10.4*  PHOS 6.6*  --  7.9*  --   --   --   --    < > = values in this interval not displayed.    CBC: Recent Labs  Lab 08/16/24 0657  WBC 4.2  HGB 9.0*  HCT 29.5*  MCV 104.6*  PLT 129*    LFT Recent Labs  Lab 08/16/24 0657 08/19/24 0439 08/19/24 1952  AST  --   --  35  ALT  --   --  40  ALKPHOS  --   --  96  BILITOT  --   --  0.4  PROT  --   --  6.0*   ALBUMIN  3.0* 2.9* 3.6     Antibiotics: Anti-infectives (From admission, onward)    Start     Dose/Rate Route Frequency Ordered Stop   07/08/24 1530  fluconazole  (DIFLUCAN ) tablet 150 mg        150 mg Oral  Once 07/08/24 1437 07/08/24 1544   06/18/24 2030  ceFAZolin  (ANCEF ) IVPB 2g/100 mL premix  Status:  Discontinued        2 g 200 mL/hr over 30 Minutes Intravenous Every 8 hours 06/18/24 1454 06/19/24 1608   06/18/24 0600  ceFAZolin  (ANCEF ) IVPB  2g/100 mL premix        2 g 200 mL/hr over 30 Minutes Intravenous On call to O.R. 06/15/24 1152 06/18/24 1230   06/06/24 1400  piperacillin -tazobactam (ZOSYN ) IVPB 2.25 g        2.25 g 100 mL/hr over 30 Minutes Intravenous Every 8 hours 06/06/24 1017 06/09/24 0538   06/06/24 1200  vancomycin  (VANCOREADY) IVPB 500 mg/100 mL        500 mg 100 mL/hr over 60 Minutes Intravenous Every M-W-F (Hemodialysis) 06/04/24 1745 06/06/24 1900   06/04/24 1845  vancomycin  (VANCOCIN ) IVPB 1000 mg/200 mL premix        1,000 mg 200 mL/hr over 60 Minutes Intravenous  Once 06/04/24 1745 06/04/24 1915   06/04/24 1830  ceFEPIme  (MAXIPIME ) 2 g in sodium chloride  0.9 % 100 mL IVPB  Status:  Discontinued        2 g 200 mL/hr over 30 Minutes Intravenous Every M-W-F (Hemodialysis) 06/04/24 1743 06/06/24 1017   05/27/24 1015  cefTRIAXone  (ROCEPHIN ) 2 g in sodium chloride  0.9 % 100 mL IVPB        2 g 200 mL/hr over 30 Minutes Intravenous Every 24 hours 05/27/24 0918 05/29/24 1014   05/23/24 1530  ceFEPIme  (MAXIPIME ) 1 g in sodium chloride  0.9 % 100 mL IVPB  Status:  Discontinued        1 g 200 mL/hr over 30 Minutes Intravenous Every 24 hours 05/23/24 1433 05/27/24 0918   05/22/24 2200  Ampicillin -Sulbactam (UNASYN ) 3 g in sodium chloride  0.9 % 100 mL IVPB  Status:  Discontinued        3 g 200 mL/hr over 30 Minutes Intravenous Every 24 hours 05/22/24 0834 05/23/24 1433   05/22/24 0930  Ampicillin -Sulbactam (UNASYN ) 3 g in sodium chloride  0.9 % 100 mL IVPB        3  g 200 mL/hr over 30 Minutes Intravenous  Once 05/22/24 0834 05/22/24 1900        CONSULTS neurosurgery, palliative care  Code Status: Full code  Family Communication:      Subjective   Mom at bedside Asking for something for HA and sleep at night   Objective    Physical Examination:  General: No acute distress. Lungs: unlabored Neurological: Alert and oriented. Moves all extremities 4. Cranial nerves II through XII grossly intact. Extremities: No clubbing or cyanosis. No edema.   Meliton Monte   [name] If 7PM-7AM, please contact night-coverage at [email or web address], Office  [phone]   08/22/2024, 10:40 AM  LOS: 95 days

## 2024-08-22 NOTE — TOC Progression Note (Signed)
 Transition of Care [facility]) - Progression Note    Patient Details  Name: [name] [name] MRN: [id number] Date of Birth: Jun 30, 1981  [facility] of [facility] CM/SW Contact  [name], [name] Phone Number: 08/22/2024, 4:37 PM  Clinical Narrative:   CSW received message from OT about a tub bunch, and tub bench was ordered and sent to [facility] for delivery.  CSW attempted to set the patient up with transportation for dialysis. CSW attempted to reach [facility] Medicare to discuss patient's transportation benefit. CSW confirmed patient has transportation benefits, given number to call for [employer] at [phone]. CSW attempted to reach that number, it was still for [facility] Medicare. CSW spoke with representative, given another number to call at [phone]. CSW called that number, redirected to [facility] again. CSW asked again about the number for safe ride to set patient up with transportation, representative confirmed number of [phone] and attempted to redirect patient to schedule. CSW redirected, but it is the same [facility] automated prompts; does not seem to be a number for safe ride transportation.  CSW attempted to reach [employer] that manages [facility] Medicaid transportation. CSW spoke with representative, they cannot find patient's eligibility but attempting to locate. [employer] will call CSW back after patient has been located with eligibility.  CSW met with patient to provide update that transportation has not been arranged yet, but CSW to work on it again on Monday. Patient appreciative of update.     Expected Discharge Plan: Home w Home Health Services Barriers to Discharge: No SNF bed, Insurance Authorization               Expected Discharge Plan and Services In-house Referral: Clinical Social Work   Post Acute Care Choice: Skilled Nursing Facility Living arrangements for the past 2 months: Apartment                                       Social Drivers of Health (SDOH)  Interventions SDOH Screenings   Food Insecurity: Patient Unable To Answer (05/20/2024)  Housing: Unknown (07/14/2024)  Transportation Needs: Patient Unable To Answer (05/20/2024)  Utilities: Patient Unable To Answer (05/20/2024)  Social Connections: Unknown (10/04/2022)   Received from [facility]  Stress: No Stress Concern Present (04/16/2024)   Received from [facility]  Tobacco Use: Medium Risk (06/18/2024)    Readmission Risk Interventions    05/20/2024    4:16 PM 03/31/2024   11:27 AM  Readmission Risk Prevention Plan  Transportation Screening Complete Complete  Medication Review [employer]) Complete Complete  PCP or Specialist appointment within 3-5 days of discharge Complete   HRI or Home Care Consult Complete Complete  SW Recovery Care/Counseling Consult Complete Complete  Palliative Care Screening Complete Not Applicable  Skilled Nursing Facility Complete Not Applicable

## 2024-08-22 NOTE — Progress Notes (Signed)
 Hold Lokelma  per Dr. Perri.

## 2024-08-22 NOTE — Progress Notes (Signed)
 Occupational Therapy Treatment Patient Details Name: [name] [name] MRN: [id number] DOB: Jun 14, 1981 Today's Date: 08/22/2024   History of present illness 43 yo female presents 05/19/24 for left gaze and L side weakness. 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 Intubated 9/15-9/23. EVD 9/15- 10/15; s/p shunt 10/15. S/p laparoscopy with intra-abdominal assistance ventricular-peritoneal shunt placement 10/15. PMH: HTN, ESRD on HD s/p renal and pancreatic transplants, DM, gastroparesis, multiple strokes (ischemic and hemorrhagic) with prepontine SAH, hydrocephalus requiring EVD placement   OT comments  Pt seen for OT treatment this AM, agreeable for visit. Goals updated as reflected per POC. Pt making good progress today. She was CGA for functional transfers + ambulation with rollator. Benefits from intermittent seated rest breaks due to reduced activity tolerance. Tolerated various standing grooming tasks at sink with CGA, incr fatigue - leaning on B forearms on vanity. Practiced tub/shower transfer, pt needing min/mod A with B knees buckling when stepping into tub. Able to transfer in/out of tub via tub transfer bench twice with good carryover. Updated CM and family on DME recs (see below). Pt would benefit from ongoing acute OT services, con't to recommend home with HHOT.      If plan is discharge home, recommend the following:  Assistance with cooking/housework;Assist for transportation;Help with stairs or ramp for entrance;A little help with walking and/or transfers;Direct supervision/assist for medications management;Direct supervision/assist for financial management;Supervision due to cognitive status;A little help with bathing/dressing/bathroom   Equipment Recommendations  BSC/3in1;Tub/shower bench    Recommendations for Other Services      Precautions / Restrictions  Precautions Precautions: Fall Recall of Precautions/Restrictions: Impaired Precaution/Restrictions Comments: PEG Restrictions Weight Bearing Restrictions Per Provider Order: No       Mobility Bed Mobility Overal bed mobility: Needs Assistance Bed Mobility: Supine to Sit, Sit to Supine     Supine to sit: Supervision Sit to supine: Contact guard assist   General bed mobility comments: incr effort for LE mgmt sit>supine    Transfers Overall transfer level: Needs assistance Equipment used: Rollator (4 wheels) Transfers: Sit to/from Stand Sit to Stand: Contact guard assist           General transfer comment: stood from bed with x2 trials until she was able to power up into stance, demo'd fair hand placement, needs cues for proper sequencing of locking brakes on rollator     Balance Overall balance assessment: Needs assistance Sitting-balance support: No upper extremity supported, Feet supported Sitting balance-Leahy Scale: Good Sitting balance - Comments: seated EOB, no LOB   Standing balance support: Bilateral upper extremity supported, During functional activity, Reliant on assistive device for balance Standing balance-Leahy Scale: Poor Standing balance comment: reliant on RW, needing CGA for stability/safety                           ADL either performed or assessed with clinical judgement   ADL Overall ADL's : Needs assistance/impaired     Grooming: Contact guard assist;Standing;Wash/dry face;Oral care Grooming Details (indicate cue type and reason): sinkside, cues to maintain upright posture, leaning on vanity as she fatigued             Lower Body Dressing: Set up;Bed level Lower Body Dressing Details (indicate cue type and reason): hip hike to don B socks bed level         Tub/ Shower Transfer: Minimal assistance;Moderate assistance;Ambulation;Tub  bench;Rollator (4 wheels) Tub/Shower Transfer Details (indicate cue type and reason): needs  assist to maintain balance while stepping over threshold of tub, cued for safe technique and for reaching to wall for stability, cued for safe approach and rollator placement near tub entry        Extremity/Trunk Assessment              Vision       Perception     Praxis     Communication Communication Communication: No apparent difficulties   Cognition Arousal: Alert Behavior During Therapy: WFL for tasks assessed/performed               OT - Cognition Comments: cognitively improving, remains with difficulty with attention & problem solving                 Following commands: Impaired Following commands impaired: Follows one step commands with increased time, Follows multi-step commands inconsistently      Cueing   Cueing Techniques: Verbal cues, Gestural cues  Exercises      Shoulder Instructions       General Comments mother present and supportive    Pertinent Vitals/ Pain       Pain Assessment Pain Assessment: No/denies pain  Home Living                                          Prior Functioning/Environment              Frequency  Min 2X/week        Progress Toward Goals  OT Goals(current goals can now be found in the care plan section)  Progress towards OT goals: Progressing toward goals;Goals updated  Acute Rehab OT Goals Time For Goal Achievement: 09/05/24  Plan      Co-evaluation                 AM-PAC OT 6 Clicks Daily Activity     Outcome Measure   Help from another person eating meals?: A Little Help from another person taking care of personal grooming?: A Little Help from another person toileting, which includes using toliet, bedpan, or urinal?: A Little Help from another person bathing (including washing, rinsing, drying)?: A Lot Help from another person to put on and taking off regular upper body clothing?: A Little Help from another person to put on and taking off regular lower  body clothing?: A Little 6 Click Score: 17    End of Session Equipment Utilized During Treatment: Gait belt;Rollator (4 wheels)  OT Visit Diagnosis: Unsteadiness on feet (R26.81);Muscle weakness (generalized) (M62.81);History of falling (Z91.81);Other symptoms and signs involving cognitive function;Cognitive communication deficit (R41.841);Low vision, both eyes (H54.2) Symptoms and signs involving cognitive functions: Nontraumatic SAH   Activity Tolerance Patient tolerated treatment well   Patient Left in bed;with call bell/phone within reach;with bed alarm set;with family/visitor present   Nurse Communication Mobility status        Time: 9050-8975 OT Time Calculation (min): 35 min  Charges: OT General Charges $OT Visit: 1 Visit OT Treatments $Self Care/Home Management : 23-37 mins  Yao Hyppolite M. [place], OTR/L [facility] Acute Rehabilitation Services [phone] Secure Chat Preferred  Dylan Monforte 08/22/2024, 1:20 PM

## 2024-08-22 NOTE — Plan of Care (Signed)
°  Problem: Education: Goal: Knowledge of disease or condition will improve Outcome: Progressing   Problem: Education: Goal: Knowledge of General Education information will improve Description: Including pain rating scale, medication(s)/side effects and non-pharmacologic comfort measures Outcome: Progressing   Problem: Clinical Measurements: Goal: Will remain free from infection Outcome: Progressing Goal: Respiratory complications will improve Outcome: Progressing Goal: Cardiovascular complication will be avoided Outcome: Progressing

## 2024-08-23 DIAGNOSIS — I619 Nontraumatic intracerebral hemorrhage, unspecified: Secondary | ICD-10-CM | POA: Diagnosis not present

## 2024-08-23 LAB — BASIC METABOLIC PANEL WITH GFR
Anion gap: 14 (ref 5–15)
BUN: 37 mg/dL — ABNORMAL HIGH (ref 6–20)
CO2: 29 mmol/L (ref 22–32)
Calcium: 9.8 mg/dL (ref 8.9–10.3)
Chloride: 94 mmol/L — ABNORMAL LOW (ref 98–111)
Creatinine, Ser: 4.93 mg/dL — ABNORMAL HIGH (ref 0.44–1.00)
GFR, Estimated: 11 mL/min — ABNORMAL LOW
Glucose, Bld: 103 mg/dL — ABNORMAL HIGH (ref 70–99)
Potassium: 4.7 mmol/L (ref 3.5–5.1)
Sodium: 136 mmol/L (ref 135–145)

## 2024-08-23 LAB — CBC
HCT: 28.9 % — ABNORMAL LOW (ref 36.0–46.0)
Hemoglobin: 9 g/dL — ABNORMAL LOW (ref 12.0–15.0)
MCH: 31.8 pg (ref 26.0–34.0)
MCHC: 31.1 g/dL (ref 30.0–36.0)
MCV: 102.1 fL — ABNORMAL HIGH (ref 80.0–100.0)
Platelets: 129 K/uL — ABNORMAL LOW (ref 150–400)
RBC: 2.83 MIL/uL — ABNORMAL LOW (ref 3.87–5.11)
RDW: 13.9 % (ref 11.5–15.5)
WBC: 2.9 K/uL — ABNORMAL LOW (ref 4.0–10.5)
nRBC: 0 % (ref 0.0–0.2)

## 2024-08-23 MED ORDER — ALTEPLASE 2 MG IJ SOLR: 2.0000 mg | Freq: Once | INTRAMUSCULAR | Status: DC | PRN

## 2024-08-23 MED ORDER — OXYCODONE HCL 5 MG PO TABS
ORAL_TABLET | ORAL | Status: AC
  Filled 2024-08-23: qty 1

## 2024-08-23 MED ORDER — LIDOCAINE-PRILOCAINE 2.5-2.5 % EX CREA: 1.0000 | TOPICAL_CREAM | CUTANEOUS | Status: DC | PRN

## 2024-08-23 MED ORDER — ANTICOAGULANT SODIUM CITRATE 4% (200MG/5ML) IV SOLN: 5.0000 mL | Status: DC | PRN

## 2024-08-23 MED ORDER — PENTAFLUOROPROP-TETRAFLUOROETH EX AERO: 1.0000 | INHALATION_SPRAY | CUTANEOUS | Status: DC | PRN

## 2024-08-23 MED ORDER — LIDOCAINE HCL (PF) 1 % IJ SOLN: 5.0000 mL | INTRAMUSCULAR | Status: DC | PRN

## 2024-08-23 MED ORDER — NEPRO/CARBSTEADY PO LIQD: 237.0000 mL | ORAL | Status: DC | PRN

## 2024-08-23 MED ORDER — HEPARIN SODIUM (PORCINE) 1000 UNIT/ML DIALYSIS: 1000.0000 [IU] | INTRAMUSCULAR | Status: DC | PRN

## 2024-08-23 NOTE — Progress Notes (Signed)
" °   08/23/24 1200  Vitals  Temp 97.7 F (36.5 C)  Pulse Rate 77  Resp 11  BP (!) 126/58  SpO2 96 %  O2 Device Room Air  Weight 56.7 kg  Type of Weight Post-Dialysis  Oxygen  Therapy  Patient Activity (if Appropriate) In bed  Pulse Oximetry Type Continuous  Oximetry Probe Site Changed No  Post Treatment  Dialyzer Clearance Lightly streaked  Hemodialysis Intake (mL) 0 mL  Liters Processed 84  Fluid Removed (mL) 3000 mL  Tolerated HD Treatment Yes  AVG/AVF Arterial Site Held (minutes) 10 minutes  AVG/AVF Venous Site Held (minutes) 10 minutes   Received patient in bed to unit.  Alert and oriented.  Informed consent signed and in chart.   TX duration:3.5  Patient tolerated well.  Transported back to the room  Alert, without acute distress.  Hand-off given to patient's nurse.   Access used: RUAF Access issues: no complications  Total UF removed: 3000 Medication(s) given: oxy 5mg  po x 1 [facility] "

## 2024-08-23 NOTE — Progress Notes (Signed)
 [name]  PROGRESS NOTE  [name] [name] FMW:[id number] DOB: 08-29-1981 DOA: 05/19/2024 PCP: [facility]   Brief HPI:    43-yrs-old female with past medical history of hypertension, CVA, type 1 diabetes, gastroparesis, kidney and pancreatic transplant 2014 with subsequent failure of renal transplant now on end-stage renal dialysis, anxiety, narcotic abuse presented to the hospital from dialysis unit on 05/19/2024 as code stroke.  Patient was noted to have hemorrhagic stroke.  Neurology, neurosurgery in critical care were consulted.  EVD was placed on the left side and was initially admitted to neuro ICU.  She was also intubated and had prolonged intubation.  Subsequently, patient was extubated on 05/27/2024.   Important events:  9/15 admitted with ICH plus IVH, EVD was placed for hydrocephalus, intubated 9/22 palliative care meeting.  Family is insistent that they want full scope of care 9/23 Extubated 10/1 -developed fevers pancultures drawn and broad-spectrum antibiotics started 10/2 became afebrile after starting antibiotics, did not tolerate raising EVD 10/6 EVD was raised from 10 to 30 cm water , tolerating well, remained afebrile 10/7 EVD is at 30 cm water , CT scan will be done tomorrow morning, no overnight issues 10/8 she became somnolent, CT head showed increasing hydrocephalus, EVD was titrated down to 10 cm of water  with improvement in mental status 10/15 VP shunt placement 10/17 transferred to progressive unit with [name]  11/18-medically stable.  Awaiting for disposition.  Insurance declined LTAC and skilled nursing facility, appeal pending. 12/3-tube feeding changed to nocturnal tube feeds, started on p.o. diet 12/9 nocturnal tube feeds d/c'd  TOC working on discharge planning.  Only barrier at this time seems to be transportation to dialysis.  Hopefully this might be arranged in the coming week?   Assessment/Plan:   Acute right parietotemporal  intraparenchymal hemorrhage / Intraventricular hemorrhage: Uncontrolled hypertension: Obstructive hydrocephalus s/p EVD changed to VP shunt: Seizure disorder S/p VP shunt placement on 06/18/2024 by Dr. Mavis.  Continue Vimpat .   Neurosurgery intermittently followed the patient during hospitalization.     ESRD on dialysis: History of failed renal transplant: Secondary hyperparathyroidism: Hyperkalemia  Nephrology on board.  Continue CellCept  and prednisone .   On Monday, Wednesday, Friday hemodialysis schedule.   Can resume outpatient HD center at discharge [facility] on MWF )  Depressed Mood C/o feeling sad some days when she returns from dialysis.  Denies SI.  Offered antidepressant, she declines, was asking for something IV, not indicated at this time.    Headache resolved  Right Upper Quadrant Pain RUQ US  with cholelithiasis  resolved  Hyponatremia improved  History of pancreatic/kidney transplantation:  Continue cyclosporine , mycophenolate , and prednisone  (will continue 15 mg dose based on discussion with renal)   Hypertension:  -Continue Coreg  25 mg p.o. twice daily -amlodipine  10 mg  - hydralazine  (increase to 100 q8)  - losartan  d/c'd with hyperkalemia  Dizziness -resolved   Anemia of chronic disease:  Stable Transfuse if Hb below 7.0   Type 1 diabetes with vasculopathy/gastroparesis:  Not on long-term insulin  anymore due to pancreatic transplant.  A1c 3.9 Follow BG intermittently on BMP    Moderate protein calorie malnutrition/dysphagia:  S/p PEG She's taking PO well, tube feeds d/c'd   Insomnia Trial trazodone   Debility/deconditioning/disposition/goals of care:  Palliative care had seen the patient during hospitalization.  Palliative care has signed off 10/17.  Disposition pending.  TOC on board.    Continuing to work on blood pressure.  RUQ pain has improved.  DVT prophylaxis: SCDs  Medications  amLODipine   10 mg Oral Daily    carvedilol   25 mg Oral BID WC   Chlorhexidine  Gluconate Cloth  6 each Topical Q0600   Chlorhexidine  Gluconate Cloth  6 each Topical Q0600   cinacalcet   30 mg Oral Q M,W,F   cycloSPORINE  modified  200 mg Oral QPM   darbepoetin (ARANESP ) injection - DIALYSIS  200 mcg Subcutaneous Q Thu-1800   diclofenac  Sodium  2 g Topical QID   famotidine   10 mg Oral QHS   gabapentin   100 mg Oral Daily   hydrALAZINE   100 mg Oral Q8H    HYDROmorphone  (DILAUDID ) injection  0.25 mg Intravenous Once   lacosamide   100 mg Oral BID   multivitamin  1 tablet Oral QHS   mycophenolate   500 mg Oral BID   pantoprazole   40 mg Oral Daily   predniSONE   15 mg Oral Q breakfast   sevelamer  carbonate  1,600 mg Oral TID WC   sodium zirconium cyclosilicate   10 g Oral Daily   traZODone   50 mg Oral QHS     Data Reviewed:   CBG:  No results for input(s): GLUCAP in the last 168 hours.   SpO2: 96 % O2 Flow Rate (L/min): 100 L/min FiO2 (%): 40 %    Vitals:   08/23/24 1100 08/23/24 1130 08/23/24 1156 08/23/24 1200  BP: (!) 130/59 (!) 146/117 (!) 104/58 (!) 126/58  Pulse: 78 78 77 77  Resp: 12 16 17 11   Temp:    97.7 F (36.5 C)  TempSrc:      SpO2: 96% 98% 97% 96%  Weight:    56.7 kg  Height:          Data Reviewed:  Basic Metabolic Panel: Recent Labs  Lab 08/19/24 0439 08/19/24 1213 08/20/24 0217 08/21/24 0248 08/22/24 0930 08/23/24 0511  NA 138 139 138 136 135 136  K 6.2* 5.7* 4.2 5.2* 4.2 4.7  CL 98 97* 96* 95* 94* 94*  CO2 26 23 29 27 27 29   GLUCOSE 70 112* 82 73 147* 103*  BUN 80* 80* 34* 47* 27* 37*  CREATININE 6.45* 6.68* 3.36* 4.89* 3.64* 4.93*  CALCIUM  9.5 10.4* 9.6 9.5 10.4* 9.8  PHOS 7.9*  --   --   --   --   --     CBC: Recent Labs  Lab 08/23/24 0511  WBC 2.9*  HGB 9.0*  HCT 28.9*  MCV 102.1*  PLT 129*    LFT Recent Labs  Lab 08/19/24 0439 08/19/24 1952  AST  --  35  ALT  --  40  ALKPHOS  --  96  BILITOT  --  0.4  PROT  --  6.0*  ALBUMIN  2.9* 3.6      Antibiotics: Anti-infectives (From admission, onward)    Start     Dose/Rate Route Frequency Ordered Stop   07/08/24 1530  fluconazole  (DIFLUCAN ) tablet 150 mg        150 mg Oral  Once 07/08/24 1437 07/08/24 1544   06/18/24 2030  ceFAZolin  (ANCEF ) IVPB 2g/100 mL premix  Status:  Discontinued        2 g 200 mL/hr over 30 Minutes Intravenous Every 8 hours 06/18/24 1454 06/19/24 1608   06/18/24 0600  ceFAZolin  (ANCEF ) IVPB 2g/100 mL premix        2 g 200 mL/hr over 30 Minutes Intravenous On call to O.R. 06/15/24 1152 06/18/24 1230   06/06/24 1400  piperacillin -tazobactam (ZOSYN ) IVPB 2.25 g  2.25 g 100 mL/hr over 30 Minutes Intravenous Every 8 hours 06/06/24 1017 06/09/24 0538   06/06/24 1200  vancomycin  (VANCOREADY) IVPB 500 mg/100 mL        500 mg 100 mL/hr over 60 Minutes Intravenous Every M-W-F (Hemodialysis) 06/04/24 1745 06/06/24 1900   06/04/24 1845  vancomycin  (VANCOCIN ) IVPB 1000 mg/200 mL premix        1,000 mg 200 mL/hr over 60 Minutes Intravenous  Once 06/04/24 1745 06/04/24 1915   06/04/24 1830  ceFEPIme  (MAXIPIME ) 2 g in sodium chloride  0.9 % 100 mL IVPB  Status:  Discontinued        2 g 200 mL/hr over 30 Minutes Intravenous Every M-W-F (Hemodialysis) 06/04/24 1743 06/06/24 1017   05/27/24 1015  cefTRIAXone  (ROCEPHIN ) 2 g in sodium chloride  0.9 % 100 mL IVPB        2 g 200 mL/hr over 30 Minutes Intravenous Every 24 hours 05/27/24 0918 05/29/24 1014   05/23/24 1530  ceFEPIme  (MAXIPIME ) 1 g in sodium chloride  0.9 % 100 mL IVPB  Status:  Discontinued        1 g 200 mL/hr over 30 Minutes Intravenous Every 24 hours 05/23/24 1433 05/27/24 0918   05/22/24 2200  Ampicillin -Sulbactam (UNASYN ) 3 g in sodium chloride  0.9 % 100 mL IVPB  Status:  Discontinued        3 g 200 mL/hr over 30 Minutes Intravenous Every 24 hours 05/22/24 0834 05/23/24 1433   05/22/24 0930  Ampicillin -Sulbactam (UNASYN ) 3 g in sodium chloride  0.9 % 100 mL IVPB        3 g 200 mL/hr over 30 Minutes  Intravenous  Once 05/22/24 0834 05/22/24 1900        CONSULTS neurosurgery, palliative care  Code Status: Full code  Family Communication:      Subjective   C/o anxiety, depressed mood, being tearful at times Not interested in antidepressant  Denies SI   Objective    Physical Examination:  General: No acute distress. Cardiovascular: RRR Lungs: unlabored Neurological: Alert . Moves all extremities 4 with equal strength. Cranial nerves II through XII grossly intact. Extremities: No clubbing or cyanosis. No edema.  Meliton Monte   [name] If 7PM-7AM, please contact night-coverage at [email or web address], Office  [phone]   08/23/2024, 2:27 PM  LOS: 96 days

## 2024-08-23 NOTE — Plan of Care (Signed)
" °  Problem: Education: Goal: Knowledge of disease or condition will improve Outcome: Progressing   Problem: Education: Goal: Knowledge of General Education information will improve Description: Including pain rating scale, medication(s)/side effects and non-pharmacologic comfort measures Outcome: Progressing   Problem: Clinical Measurements: Goal: Will remain free from infection Outcome: Progressing Goal: Respiratory complications will improve Outcome: Progressing   Problem: Activity: Goal: Risk for activity intolerance will decrease Outcome: Progressing   "

## 2024-08-23 NOTE — Progress Notes (Signed)
 " [facility] Progress Note   Subjective:    Seen and examined patient on HD. Tolerating UFG 3L and BP is 135/56. She reports feeling anxious after HD lately and I reached out to the Hospitalist about this.   Objective Vitals:   08/23/24 0930 08/23/24 1000 08/23/24 1030 08/23/24 1100  BP: (!) 149/66 (!) 130/54 (!) 136/56 (!) 130/59  Pulse: 79 80 75 78  Resp: 14 14 16 12   Temp:      TempSrc:      SpO2: 96% 94% 94% 96%  Weight:      Height:       Physical Exam General: Improving, NAD. Room air Heart: RRR Lungs: CTAB Abdomen: soft Extremities: no LE edema Dialysis Access:  AVF +t/b  Filed Weights   08/21/24 1156 08/21/24 1157 08/23/24 0813  Weight: 57.9 kg 57.9 kg 54.9 kg    Intake/Output Summary (Last 24 hours) at 08/23/2024 1150 Last data filed at 08/23/2024 0730 Gross per 24 hour  Intake 90 ml  Output --  Net 90 ml    Additional Objective Labs: Basic Metabolic Panel: Recent Labs  Lab 08/19/24 0439 08/19/24 1213 08/21/24 0248 08/22/24 0930 08/23/24 0511  NA 138   < > 136 135 136  K 6.2*   < > 5.2* 4.2 4.7  CL 98   < > 95* 94* 94*  CO2 26   < > 27 27 29   GLUCOSE 70   < > 73 147* 103*  BUN 80*   < > 47* 27* 37*  CREATININE 6.45*   < > 4.89* 3.64* 4.93*  CALCIUM  9.5   < > 9.5 10.4* 9.8  PHOS 7.9*  --   --   --   --    < > = values in this interval not displayed.   Liver Function Tests: Recent Labs  Lab 08/19/24 0439 08/19/24 1952  AST  --  35  ALT  --  40  ALKPHOS  --  96  BILITOT  --  0.4  PROT  --  6.0*  ALBUMIN  2.9* 3.6   Recent Labs  Lab 08/19/24 1952  LIPASE 34   CBC: Recent Labs  Lab 08/23/24 0511  WBC 2.9*  HGB 9.0*  HCT 28.9*  MCV 102.1*  PLT 129*   Blood Culture    Component Value Date/Time   SDES CSF 06/11/2024 0920   SPECREQUEST LP 06/11/2024 0920   CULT  06/11/2024 0920    NO GROWTH 3 DAYS Performed at [facility], [address], [place], [name] [address]    REPTSTATUS 06/14/2024 FINAL  06/11/2024 0920    Cardiac Enzymes: No results for input(s): CKTOTAL, CKMB, CKMBINDEX, TROPONINI in the last 168 hours. CBG: No results for input(s): GLUCAP in the last 168 hours. Iron  Studies: No results for input(s): IRON , TIBC, TRANSFERRIN, FERRITIN in the last 72 hours. Lab Results  Component Value Date   INR 1.1 06/18/2024   INR 1.1 06/16/2024   INR 1.1 05/20/2024   Studies/Results: No results found.  Medications:  anticoagulant sodium citrate      cycloSPORINE  modified (GENGRAF ) 225 mg      amLODipine   10 mg Oral Daily   carvedilol   25 mg Oral BID WC   Chlorhexidine  Gluconate Cloth  6 each Topical Q0600   Chlorhexidine  Gluconate Cloth  6 each Topical Q0600   cinacalcet   30 mg Oral Q M,W,F   cycloSPORINE  modified  200 mg Oral QPM   darbepoetin (ARANESP ) injection -  DIALYSIS  200 mcg Subcutaneous Q Thu-1800   diclofenac  Sodium  2 g Topical QID   famotidine   10 mg Oral QHS   gabapentin   100 mg Oral Daily   hydrALAZINE   100 mg Oral Q8H    HYDROmorphone  (DILAUDID ) injection  0.25 mg Intravenous Once   lacosamide   100 mg Oral BID   multivitamin  1 tablet Oral QHS   mycophenolate   500 mg Oral BID   pantoprazole   40 mg Oral Daily   predniSONE   15 mg Oral Q breakfast   sevelamer  carbonate  1,600 mg Oral TID WC   sodium zirconium cyclosilicate   10 g Oral Daily   traZODone   50 mg Oral QHS    Dialysis Orders: 3:45hr, 400/A1.5, EDW 55.2kg, 2K/2Ca bath, AVF, no heparin  - Prev on Mircera 150mcg IV q 2 weeks - No VDRA  Assessment/Plan: Acute R intracranial hemorrhage: 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 Needs ongoing rehab. Doing a lot better, memory and alertness much improved.  ESRD: Usual MWF schedule - off at some point and getting HD on TTS schedule this week - for HD tomorrow. Try to get back on usual schedule at some point next week. Hyperkalemia: Now on Lokelma  daily - fine. HTN/volume: BP higher lately, pulm edema on CXR. Continue carvedilol   25mg  BID, hydralazine  100mg  TID, and amlodipine  10 mg daily. ^ UFG with HD today-on treament now. Anemia of ESRD: Hgb 8s > 9s, transfuse prn. Continue Aranesp  200 q Thurs. 12/6 iron  sat 31%.  Secondary HPTH: Ca ok, last Phos high and sevelamer  dose increased. Continue sensipar , not on VDRA (PTH 126) Nutrition: Alb low, supp per RD. Starting to increase PO intake. 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 Anxiety: Patient informs me of feeling anxious after HD lately. I discussed this with the Hospitalist Dispo: Insurance declined LTAC, and SNF, appeal pending. Working to go home. Tolerated HD in recliner on multiple days.     FYI -- Holiday Schedule for next week Usual MWF schedule -> Sun (21), Tues (23), Friday (26) Usual TTS schedule -> Mon (22), Wed (24), Sat (27)  [name], NP [facility] 08/23/2024,11:50 AM  LOS: 96 days    "

## 2024-08-24 ENCOUNTER — Other Ambulatory Visit (HOSPITAL_COMMUNITY): Payer: Self-pay

## 2024-08-24 DIAGNOSIS — I619 Nontraumatic intracerebral hemorrhage, unspecified: Secondary | ICD-10-CM | POA: Diagnosis not present

## 2024-08-24 MED ORDER — SENNOSIDES-DOCUSATE SODIUM 8.6-50 MG PO TABS
1.0000 | ORAL_TABLET | Freq: Every day | ORAL | Status: DC
  Administered 2024-08-24 – 2024-08-26 (×3): 1 via ORAL
  Filled 2024-08-24 (×3): qty 1

## 2024-08-24 MED ORDER — CHLORHEXIDINE GLUCONATE CLOTH 2 % EX PADS
6.0000 | MEDICATED_PAD | Freq: Every day | CUTANEOUS | Status: DC
  Administered 2024-08-25 – 2024-08-27 (×3): 6 via TOPICAL

## 2024-08-24 NOTE — Progress Notes (Signed)
 " [name] [name] Progress Note   Subjective:    Seen and examined patient at bedside. Tolerated yesterday's HD with net UF 3L. No acute issues today. Next HD tomorrow.  Objective Vitals:   08/23/24 2359 08/24/24 0607 08/24/24 0834 08/24/24 1239  BP: (!) 177/64 (!) 165/67 139/62 (!) 155/61  Pulse: 76 78 85 79  Resp: 18 18 16 18   Temp: 97.7 F (36.5 C) 98.9 F (37.2 C) 98.7 F (37.1 C) 99.4 F (37.4 C)  TempSrc: Axillary Oral Oral Oral  SpO2: 98% 100% 96% 95%  Weight:      Height:       Physical Exam General: Improving, NAD. Room air Heart: RRR Lungs: CTAB Abdomen: soft Extremities: no LE edema Dialysis Access:  AVF +t/b  Filed Weights   08/21/24 1157 08/23/24 0813 08/23/24 1200  Weight: 57.9 kg 54.9 kg 56.7 kg    Intake/Output Summary (Last 24 hours) at 08/24/2024 1421 Last data filed at 08/23/2024 1900 Gross per 24 hour  Intake 180 ml  Output --  Net 180 ml    Additional Objective Labs: Basic Metabolic Panel: Recent Labs  Lab 08/19/24 0439 08/19/24 1213 08/21/24 0248 08/22/24 0930 08/23/24 0511  NA 138   < > 136 135 136  K 6.2*   < > 5.2* 4.2 4.7  CL 98   < > 95* 94* 94*  CO2 26   < > 27 27 29   GLUCOSE 70   < > 73 147* 103*  BUN 80*   < > 47* 27* 37*  CREATININE 6.45*   < > 4.89* 3.64* 4.93*  CALCIUM  9.5   < > 9.5 10.4* 9.8  PHOS 7.9*  --   --   --   --    < > = values in this interval not displayed.   Liver Function Tests: Recent Labs  Lab 08/19/24 0439 08/19/24 1952  AST  --  35  ALT  --  40  ALKPHOS  --  96  BILITOT  --  0.4  PROT  --  6.0*  ALBUMIN  2.9* 3.6   Recent Labs  Lab 08/19/24 1952  LIPASE 34   CBC: Recent Labs  Lab 08/23/24 0511  WBC 2.9*  HGB 9.0*  HCT 28.9*  MCV 102.1*  PLT 129*   Blood Culture    Component Value Date/Time   SDES CSF 06/11/2024 0920   SPECREQUEST LP 06/11/2024 0920   CULT  06/11/2024 0920    NO GROWTH 3 DAYS Performed at [facility], [address], [place], [name]  [address]    REPTSTATUS 06/14/2024 FINAL 06/11/2024 0920    Cardiac Enzymes: No results for input(s): CKTOTAL, CKMB, CKMBINDEX, TROPONINI in the last 168 hours. CBG: No results for input(s): GLUCAP in the last 168 hours. Iron  Studies: No results for input(s): IRON , TIBC, TRANSFERRIN, FERRITIN in the last 72 hours. Lab Results  Component Value Date   INR 1.1 06/18/2024   INR 1.1 06/16/2024   INR 1.1 05/20/2024   Studies/Results: No results found.  Medications:  cycloSPORINE  modified (GENGRAF ) 225 mg      amLODipine   10 mg Oral Daily   carvedilol   25 mg Oral BID WC   Chlorhexidine  Gluconate Cloth  6 each Topical Q0600   Chlorhexidine  Gluconate Cloth  6 each Topical Q0600   cinacalcet   30 mg Oral Q M,W,F   cycloSPORINE  modified  200 mg Oral QPM   darbepoetin (ARANESP ) injection - DIALYSIS  200 mcg Subcutaneous Q  Thu-1800   diclofenac  Sodium  2 g Topical QID   famotidine   10 mg Oral QHS   gabapentin   100 mg Oral Daily   hydrALAZINE   100 mg Oral Q8H    HYDROmorphone  (DILAUDID ) injection  0.25 mg Intravenous Once   lacosamide   100 mg Oral BID   multivitamin  1 tablet Oral QHS   mycophenolate   500 mg Oral BID   pantoprazole   40 mg Oral Daily   predniSONE   15 mg Oral Q breakfast   sevelamer  carbonate  1,600 mg Oral TID WC   sodium zirconium cyclosilicate   10 g Oral Daily   traZODone   50 mg Oral QHS    Dialysis Orders: 3:45hr, 400/A1.5, EDW 55.2kg, 2K/2Ca bath, AVF, no heparin  - Prev on Mircera 150mcg IV q 2 weeks - No VDRA  Assessment/Plan: Acute R intracranial hemorrhage: 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 Needs ongoing rehab. Doing a lot better, memory and alertness much improved.  ESRD: Usual MWF schedule - off at some point and getting HD on TTS schedule this week - for HD tomorrow per HD holiday schedule. Try to get back on usual schedule at some point next week. Hyperkalemia: Now on Lokelma  daily - fine. HTN/volume: BP higher lately, pulm edema  on CXR. Continue carvedilol  25mg  BID, hydralazine  100mg  TID, and amlodipine  10 mg daily. Push UF as tolerated. Anemia of ESRD: Hgb 8s > 9s, transfuse prn. Continue Aranesp  200 q Thurs. 12/6 iron  sat 31%.  Secondary HPTH: Ca ok, last Phos high and sevelamer  dose increased. Continue sensipar , not on VDRA (PTH 126) Nutrition: Alb low, supp per RD. Starting to increase PO intake. 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 Anxiety: Patient informs me of feeling anxious after HD lately. I discussed this with the Hospitalist Dispo: Insurance declined LTAC, and SNF, appeal pending. Working to go home. Tolerated HD in recliner on multiple days.     FYI -- Holiday Schedule for next week Usual MWF schedule -> Sun (21), Tues (23), Friday (26) Usual TTS schedule -> Mon (22), Wed (24), Sat (27)  [name], NP [name] 08/24/2024,2:21 PM  LOS: 97 days    "

## 2024-08-24 NOTE — Progress Notes (Signed)
 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  Patient was noted to have hemorrhagic stroke.  Neurology, neurosurgery in critical care were consulted.  EVD was placed on the left side and was initially admitted to neuro ICU.  She was also intubated and had prolonged intubation.  Subsequently, patient was extubated on 05/27/2024.   Important events:  9/15 admitted with ICH plus IVH, EVD was placed for hydrocephalus, intubated 9/22 palliative care meeting.  Family is insistent that they want full scope of care 9/23 Extubated 10/1 -developed fevers pancultures drawn and broad-spectrum antibiotics started 10/2 became afebrile after starting antibiotics, did not tolerate raising EVD 10/6 EVD was raised from 10 to 30 cm water , tolerating well, remained afebrile 10/7 EVD is at 30 cm water , CT scan will be done tomorrow morning, no overnight issues 10/8 she became somnolent, CT head showed increasing hydrocephalus, EVD was titrated down to 10 cm of water  with improvement in mental status 10/15 VP shunt placement 10/17 transferred to progressive unit with [name]  11/18-medically stable.  Awaiting for disposition.  Insurance declined LTAC and skilled nursing facility, appeal pending. 12/3-tube feeding changed to nocturnal tube feeds, started on p.o. diet 12/9 nocturnal tube feeds d/c'd  Assessment & Plan:   Principal Problem:   Hemorrhagic stroke [facility]) Active Problems:   Moderate protein-calorie malnutrition   Pressure injury of skin   Nontraumatic subcortical hemorrhage of right cerebral hemisphere  [facility])  Acute right parietotemporal intraparenchymal hemorrhage / Intraventricular hemorrhage: Uncontrolled hypertension: Obstructive hydrocephalus s/p EVD changed to VP shunt: Seizure disorder S/p VP shunt placement on 06/18/2024 by Dr. Mavis.  Continue Vimpat .   Neurosurgery intermittently followed the patient during hospitalization.     ESRD on dialysis: History of failed renal transplant: Secondary hyperparathyroidism: Hyperkalemia  Nephrology on board.  Continue CellCept  and prednisone .   On Monday, Wednesday, Friday hemodialysis schedule.   Can resume outpatient HD center at discharge [facility] on MWF )   Depressed Mood C/o feeling sad some days when she returns from dialysis.  Continues to denie SI.  Offered antidepressant, she declines, was asking for something IV, not indicated at this time.     Headache resolved  Right Upper Quadrant Pain RUQ US  with cholelithiasis  resolved   Hyponatremia improved   History of pancreatic/kidney transplantation:  Continue cyclosporine , mycophenolate , and prednisone  (will continue 15 mg dose based on discussion with renal)   Hypertension:  -Continue Coreg  25 mg p.o. twice daily -amlodipine  10 mg  - hydralazine  (increase to 100 q8)  - losartan  d/c'd with hyperkalemia Blood pressure fairly controlled.  Will monitor 1-2 more days on current regimen.  Dizziness -resolved    Anemia of chronic disease:  Stable Transfuse if Hb below 7.0   Type 1 diabetes with vasculopathy/gastroparesis:  Not on long-term insulin  anymore due to pancreatic transplant.  A1c 3.9 Follow BG intermittently on BMP    Moderate protein calorie malnutrition/dysphagia:  S/p PEG She's taking PO well, tube feeds d/c'd   Insomnia Trial trazodone    Debility/deconditioning/disposition/goals of care:  Palliative care had seen the patient during hospitalization.  Palliative care has signed off 10/17.  Disposition pending.  TOC on  board.     Continuing  to work on blood pressure.  RUQ pain has improved.  DVT prophylaxis: SCDs Start: 05/19/24 1724   Code Status: Full Code  Family Communication: Mother present at bedside.  Plan of care discussed with patient in length and he/she verbalized understanding and agreed with it.  Status is: Inpatient Remains inpatient appropriate because: Medically stable, pending placement.   Estimated body mass index is 22.14 kg/m as calculated from the following:   Height as of this encounter: 5' 3 (1.6 m).   Weight as of this encounter: 56.7 kg.    Nutritional Assessment: Body mass index is 22.14 kg/m.[name] Seen by dietician.  I agree with the assessment and plan as outlined below: Nutrition Status: Nutrition Problem: Moderate Malnutrition Etiology: chronic illness (ESRD on HD/DM and gastroparesis) Signs/Symptoms: severe muscle depletion, mild fat depletion Interventions: Tube feeding, Prostat, MVI  . Skin Assessment: I have examined the patient's skin and I agree with the wound assessment as performed by the wound care RN as outlined below:    Consultants:  Nephrology  Procedures:  As above  Antimicrobials:  Anti-infectives (From admission, onward)    Start     Dose/Rate Route Frequency Ordered Stop   07/08/24 1530  fluconazole  (DIFLUCAN ) tablet 150 mg        150 mg Oral  Once 07/08/24 1437 07/08/24 1544   06/18/24 2030  ceFAZolin  (ANCEF ) IVPB 2g/100 mL premix  Status:  Discontinued        2 g 200 mL/hr over 30 Minutes Intravenous Every 8 hours 06/18/24 1454 06/19/24 1608   06/18/24 0600  ceFAZolin  (ANCEF ) IVPB 2g/100 mL premix        2 g 200 mL/hr over 30 Minutes Intravenous On call to O.R. 06/15/24 1152 06/18/24 1230   06/06/24 1400  piperacillin -tazobactam (ZOSYN ) IVPB 2.25 g        2.25 g 100 mL/hr over 30 Minutes Intravenous Every 8 hours 06/06/24 1017 06/09/24 0538   06/06/24 1200  vancomycin  (VANCOREADY) IVPB 500 mg/100 mL        500 mg 100 mL/hr over 60 Minutes Intravenous  Every M-W-F (Hemodialysis) 06/04/24 1745 06/06/24 1900   06/04/24 1845  vancomycin  (VANCOCIN ) IVPB 1000 mg/200 mL premix        1,000 mg 200 mL/hr over 60 Minutes Intravenous  Once 06/04/24 1745 06/04/24 1915   06/04/24 1830  ceFEPIme  (MAXIPIME ) 2 g in sodium chloride  0.9 % 100 mL IVPB  Status:  Discontinued        2 g 200 mL/hr over 30 Minutes Intravenous Every M-W-F (Hemodialysis) 06/04/24 1743 06/06/24 1017   05/27/24 1015  cefTRIAXone  (ROCEPHIN ) 2 g in sodium chloride  0.9 % 100 mL IVPB        2 g 200 mL/hr over 30 Minutes Intravenous Every 24 hours 05/27/24 0918 05/29/24 1014   05/23/24 1530  ceFEPIme  (MAXIPIME ) 1 g in sodium chloride  0.9 % 100 mL IVPB  Status:  Discontinued        1 g 200 mL/hr over 30 Minutes Intravenous Every 24 hours 05/23/24 1433 05/27/24 0918   05/22/24 2200  Ampicillin -Sulbactam (UNASYN ) 3 g in sodium chloride  0.9 % 100 mL IVPB  Status:  Discontinued        3 g 200 mL/hr over 30 Minutes Intravenous Every 24 hours 05/22/24 0834 05/23/24 1433   05/22/24 0930  Ampicillin -Sulbactam (UNASYN ) 3 g in sodium chloride  0.9 % 100 mL IVPB        3 g 200  mL/hr over 30 Minutes Intravenous  Once 05/22/24 9165 05/22/24 1900         Subjective: Patient seen and examined, mother at the bedside.  Patient is known to me from previous visits.  She has significantly improved.  She is fully alert and oriented now.  She has no complaints.  Mother is fully aware of the plans and the hurdles at this point in time.  Mother was concerned about high blood pressure at night however I reviewed all blood pressure readings overnight and showed her as well, they are mostly stable except 1 reading where her diastolic was 117 which can be erroneous.  Objective: Vitals:   08/23/24 1652 08/23/24 2119 08/23/24 2359 08/24/24 0607  BP: (!) 166/64 (!) 170/69 (!) 177/64 (!) 165/67  Pulse: 77 73 76 78  Resp: 16 18 18 18   Temp:   97.7 F (36.5 C) 98.9 F (37.2 C)  TempSrc:   Axillary Oral  SpO2:  95% 96% 98% 100%  Weight:      Height:        Intake/Output Summary (Last 24 hours) at 08/24/2024 0728 Last data filed at 08/23/2024 1900 Gross per 24 hour  Intake 450 ml  Output 3000 ml  Net -2550 ml   Filed Weights   08/21/24 1157 08/23/24 0813 08/23/24 1200  Weight: 57.9 kg 54.9 kg 56.7 kg    Examination:  General exam: Appears calm and comfortable  Respiratory system: Clear to auscultation. Respiratory effort normal. Cardiovascular system: S1 & S2 heard, RRR. No JVD, murmurs, rubs, gallops or clicks. No pedal edema. Gastrointestinal system: Abdomen is nondistended, soft and nontender. No organomegaly or masses felt. Normal bowel sounds heard. Central nervous system: Alert and oriented. No focal neurological deficits. Extremities: Symmetric 5 x 5 power. Skin: No rashes, lesions or ulcers Psychiatry: Judgement and insight appear normal. Mood & affect appropriate.    Data Reviewed: I have personally reviewed following labs and imaging studies  CBC: Recent Labs  Lab 08/23/24 0511  WBC 2.9*  HGB 9.0*  HCT 28.9*  MCV 102.1*  PLT 129*   Basic Metabolic Panel: Recent Labs  Lab 08/19/24 0439 08/19/24 1213 08/20/24 0217 08/21/24 0248 08/22/24 0930 08/23/24 0511  NA 138 139 138 136 135 136  K 6.2* 5.7* 4.2 5.2* 4.2 4.7  CL 98 97* 96* 95* 94* 94*  CO2 26 23 29 27 27 29   GLUCOSE 70 112* 82 73 147* 103*  BUN 80* 80* 34* 47* 27* 37*  CREATININE 6.45* 6.68* 3.36* 4.89* 3.64* 4.93*  CALCIUM  9.5 10.4* 9.6 9.5 10.4* 9.8  PHOS 7.9*  --   --   --   --   --    GFR: Estimated Creatinine Clearance: 12.3 mL/min (A) (by C-G formula based on SCr of 4.93 mg/dL (H)). Liver Function Tests: Recent Labs  Lab 08/19/24 0439 08/19/24 1952  AST  --  35  ALT  --  40  ALKPHOS  --  96  BILITOT  --  0.4  PROT  --  6.0*  ALBUMIN  2.9* 3.6   Recent Labs  Lab 08/19/24 1952  LIPASE 34   No results for input(s): AMMONIA in the last 168 hours. Coagulation Profile: No results  for input(s): INR, PROTIME in the last 168 hours. Cardiac Enzymes: No results for input(s): CKTOTAL, CKMB, CKMBINDEX, TROPONINI in the last 168 hours. BNP (last 3 results) No results for input(s): PROBNP in the last 8760 hours. HbA1C: No results for input(s): HGBA1C in the last  72 hours. CBG: No results for input(s): GLUCAP in the last 168 hours. Lipid Profile: No results for input(s): CHOL, HDL, LDLCALC, TRIG, CHOLHDL, LDLDIRECT in the last 72 hours. Thyroid Function Tests: No results for input(s): TSH, T4TOTAL, FREET4, T3FREE, THYROIDAB in the last 72 hours. Anemia Panel: No results for input(s): VITAMINB12, FOLATE, FERRITIN, TIBC, IRON , RETICCTPCT in the last 72 hours. Sepsis Labs: No results for input(s): PROCALCITON, LATICACIDVEN in the last 168 hours.  No results found for this or any previous visit (from the past 240 hours).   Radiology Studies: No results found.  Scheduled Meds:  amLODipine   10 mg Oral Daily   carvedilol   25 mg Oral BID WC   Chlorhexidine  Gluconate Cloth  6 each Topical Q0600   Chlorhexidine  Gluconate Cloth  6 each Topical Q0600   cinacalcet   30 mg Oral Q M,W,F   cycloSPORINE  modified  200 mg Oral QPM   darbepoetin (ARANESP ) injection - DIALYSIS  200 mcg Subcutaneous Q Thu-1800   diclofenac  Sodium  2 g Topical QID   famotidine   10 mg Oral QHS   gabapentin   100 mg Oral Daily   hydrALAZINE   100 mg Oral Q8H    HYDROmorphone  (DILAUDID ) injection  0.25 mg Intravenous Once   lacosamide   100 mg Oral BID   multivitamin  1 tablet Oral QHS   mycophenolate   500 mg Oral BID   pantoprazole   40 mg Oral Daily   predniSONE   15 mg Oral Q breakfast   sevelamer  carbonate  1,600 mg Oral TID WC   sodium zirconium cyclosilicate   10 g Oral Daily   traZODone   50 mg Oral QHS   Continuous Infusions:  cycloSPORINE  modified (GENGRAF ) 225 mg       LOS: 97 days   Fredia Skeeter, MD [name]  08/24/2024,  7:28 AM   *Please note that this is a verbal dictation therefore any spelling or grammatical errors are due to the Dragon [employer] One system interpretation.  Please page via Amion and do not message via secure chat for urgent patient care matters. Secure chat can be used for non urgent patient care matters.  How to contact the [facility] Attending or Consulting provider 7A - 7P or covering provider during after hours 7P -7A, for this patient?  Check the care team in [facility] and look for a) attending/consulting [facility] provider listed and b) the [facility] team listed. Page or secure chat 7A-7P. Log into [email or web address] and use [facility] universal password to access. If you do not have the password, please contact the hospital operator. Locate the [facility] provider you are looking for under [name] and page to a number that you can be directly reached. If you still have difficulty reaching the provider, please page the [facility] (Director on Call) for the Hospitalists listed on amion for assistance.  "

## 2024-08-25 DIAGNOSIS — I619 Nontraumatic intracerebral hemorrhage, unspecified: Secondary | ICD-10-CM | POA: Diagnosis not present

## 2024-08-25 LAB — CBC
HCT: 31.5 % — ABNORMAL LOW (ref 36.0–46.0)
Hemoglobin: 9.8 g/dL — ABNORMAL LOW (ref 12.0–15.0)
MCH: 31.7 pg (ref 26.0–34.0)
MCHC: 31.1 g/dL (ref 30.0–36.0)
MCV: 101.9 fL — ABNORMAL HIGH (ref 80.0–100.0)
Platelets: 137 K/uL — ABNORMAL LOW (ref 150–400)
RBC: 3.09 MIL/uL — ABNORMAL LOW (ref 3.87–5.11)
RDW: 13.9 % (ref 11.5–15.5)
WBC: 3.9 K/uL — ABNORMAL LOW (ref 4.0–10.5)
nRBC: 0 % (ref 0.0–0.2)

## 2024-08-25 LAB — RENAL FUNCTION PANEL
Albumin: 3.8 g/dL (ref 3.5–5.0)
Anion gap: 16 — ABNORMAL HIGH (ref 5–15)
BUN: 43 mg/dL — ABNORMAL HIGH (ref 6–20)
CO2: 27 mmol/L (ref 22–32)
Calcium: 10.4 mg/dL — ABNORMAL HIGH (ref 8.9–10.3)
Chloride: 94 mmol/L — ABNORMAL LOW (ref 98–111)
Creatinine, Ser: 5.47 mg/dL — ABNORMAL HIGH (ref 0.44–1.00)
GFR, Estimated: 9 mL/min — ABNORMAL LOW
Glucose, Bld: 150 mg/dL — ABNORMAL HIGH (ref 70–99)
Phosphorus: 5.6 mg/dL — ABNORMAL HIGH (ref 2.5–4.6)
Potassium: 4.9 mmol/L (ref 3.5–5.1)
Sodium: 136 mmol/L (ref 135–145)

## 2024-08-25 MED ORDER — CYCLOSPORINE MODIFIED (GENGRAF) 25 MG PO CAPS: 200.0000 mg | ORAL_CAPSULE | Freq: Every evening | ORAL | Status: DC

## 2024-08-25 MED ORDER — CYCLOSPORINE MODIFIED (GENGRAF) 25 MG PO CAPS: 225.0000 mg | ORAL_CAPSULE | Freq: Every day | ORAL | Status: DC

## 2024-08-25 MED ORDER — DIPHENHYDRAMINE HCL 25 MG PO CAPS
ORAL_CAPSULE | ORAL | Status: AC
  Filled 2024-08-25: qty 1

## 2024-08-25 MED ORDER — CYCLOSPORINE MODIFIED (GENGRAF) 25 MG PO CAPS
25.0000 mg | ORAL_CAPSULE | Freq: Every day | ORAL | Status: DC
  Administered 2024-08-26 – 2024-08-27 (×2): 25 mg via ORAL
  Filled 2024-08-25 (×2): qty 1

## 2024-08-25 MED ORDER — CYCLOSPORINE MODIFIED (GENGRAF) 100 MG PO CAPS
200.0000 mg | ORAL_CAPSULE | Freq: Every day | ORAL | Status: DC
  Administered 2024-08-26 – 2024-08-27 (×2): 200 mg via ORAL
  Filled 2024-08-25 (×2): qty 2

## 2024-08-25 MED ORDER — CYCLOSPORINE MODIFIED (GENGRAF) 25 MG PO CAPS
200.0000 mg | ORAL_CAPSULE | Freq: Every evening | ORAL | Status: DC
  Administered 2024-08-26: 200 mg via ORAL
  Filled 2024-08-25 (×2): qty 8

## 2024-08-25 MED ORDER — ALTEPLASE 2 MG IJ SOLR: 2.0000 mg | Freq: Once | INTRAMUSCULAR | Status: DC | PRN

## 2024-08-25 NOTE — Progress Notes (Signed)
 " PROGRESS NOTE    [name] [name]  FMW:[id number] DOB: October 30, 1980 DOA: 05/19/2024 PCP: [facility]   Brief Narrative:  43-yrs-old female with past medical history of hypertension, CVA, type 1 diabetes, gastroparesis, kidney and pancreatic transplant 2014 with subsequent failure of renal transplant now on end-stage renal dialysis, anxiety, narcotic abuse presented to the hospital from dialysis unit on 05/19/2024 as code stroke.  Patient was noted to have hemorrhagic stroke.  Neurology, neurosurgery in critical care were consulted.  EVD was placed on the left side and was initially admitted to neuro ICU.  She was also intubated and had prolonged intubation.  Subsequently, patient was extubated on 05/27/2024.   Important events:  9/15 admitted with ICH plus IVH, EVD was placed for hydrocephalus, intubated 9/22 palliative care meeting.  Family is insistent that they want full scope of care 9/23 Extubated 10/1 -developed fevers pancultures drawn and broad-spectrum antibiotics started 10/2 became afebrile after starting antibiotics, did not tolerate raising EVD 10/6 EVD was raised from 10 to 30 cm water , tolerating well, remained afebrile 10/7 EVD is at 30 cm water , CT scan will be done tomorrow morning, no overnight issues 10/8 she became somnolent, CT head showed increasing hydrocephalus, EVD was titrated down to 10 cm of water  with improvement in mental status 10/15 VP shunt placement 10/17 transferred to progressive unit with [name]  11/18-medically stable.  Awaiting for disposition.  Insurance declined LTAC and skilled nursing facility, appeal pending. 12/3-tube feeding changed to nocturnal tube feeds, started on p.o. diet 12/9 nocturnal tube feeds d/c'd  Assessment & Plan:   Principal Problem:   Hemorrhagic stroke [facility]) Active Problems:   Moderate protein-calorie malnutrition   Pressure injury of skin   Nontraumatic subcortical hemorrhage of right cerebral hemisphere  [facility])  Acute right parietotemporal intraparenchymal hemorrhage / Intraventricular hemorrhage: Uncontrolled hypertension: Obstructive hydrocephalus s/p EVD changed to VP shunt: Seizure disorder S/p VP shunt placement on 06/18/2024 by Dr. Mavis.  Continue Vimpat .   Neurosurgery intermittently followed the patient during hospitalization.     ESRD on dialysis: History of failed renal transplant: Secondary hyperparathyroidism: Hyperkalemia  Nephrology on board.  Continue CellCept  and prednisone .   On Monday, Wednesday, Friday hemodialysis schedule.   Can resume outpatient HD center at discharge [facility] on MWF )   Depressed Mood C/o feeling sad some days when she returns from dialysis.  Continues to denie SI.  Offered antidepressant, she declines, was asking for something IV, not indicated at this time.     Headache resolved  Right Upper Quadrant Pain RUQ US  with cholelithiasis  resolved   Hyponatremia improved   History of pancreatic/kidney transplantation:  Continue cyclosporine , mycophenolate , and prednisone  (will continue 15 mg dose based on discussion with renal)   Hypertension:  -Continue Coreg  25 mg p.o. twice daily -amlodipine  10 mg  - hydralazine  (increase to 100 q8)  - losartan  d/c'd with hyperkalemia Blood pressure fairly controlled.  Will monitor 1-2 more days on current regimen.  Dizziness -resolved    Anemia of chronic disease:  Stable Transfuse if Hb below 7.0   Type 1 diabetes with vasculopathy/gastroparesis:  Not on long-term insulin  anymore due to pancreatic transplant.  A1c 3.9 Follow BG intermittently on BMP    Moderate protein calorie malnutrition/dysphagia:  S/p PEG She's taking PO well, tube feeds d/c'd   Insomnia Trial trazodone    Debility/deconditioning/disposition/goals of care:  Palliative care had seen the patient during hospitalization.  Palliative care has signed off 10/17.  Disposition pending.  TOC on  board.     Continuing  to work on blood pressure.  RUQ pain has improved.  DVT prophylaxis: SCDs Start: 05/19/24 1724   Code Status: Full Code  Family Communication: Mother present at bedside.  Plan of care discussed with patient in length and he/she verbalized understanding and agreed with it.  Status is: Inpatient Remains inpatient appropriate because: Medically stable, pending placement.   Estimated body mass index is 22.14 kg/m as calculated from the following:   Height as of this encounter: 5' 3 (1.6 m).   Weight as of this encounter: 56.7 kg.    Nutritional Assessment: Body mass index is 22.14 kg/m.[name] Seen by dietician.  I agree with the assessment and plan as outlined below: Nutrition Status: Nutrition Problem: Moderate Malnutrition Etiology: chronic illness (ESRD on HD/DM and gastroparesis) Signs/Symptoms: severe muscle depletion, mild fat depletion Interventions: Tube feeding, Prostat, MVI  . Skin Assessment: I have examined the patient's skin and I agree with the wound assessment as performed by the wound care RN as outlined below:    Consultants:  Nephrology  Procedures:  As above  Antimicrobials:  Anti-infectives (From admission, onward)    Start     Dose/Rate Route Frequency Ordered Stop   07/08/24 1530  fluconazole  (DIFLUCAN ) tablet 150 mg        150 mg Oral  Once 07/08/24 1437 07/08/24 1544   06/18/24 2030  ceFAZolin  (ANCEF ) IVPB 2g/100 mL premix  Status:  Discontinued        2 g 200 mL/hr over 30 Minutes Intravenous Every 8 hours 06/18/24 1454 06/19/24 1608   06/18/24 0600  ceFAZolin  (ANCEF ) IVPB 2g/100 mL premix        2 g 200 mL/hr over 30 Minutes Intravenous On call to O.R. 06/15/24 1152 06/18/24 1230   06/06/24 1400  piperacillin -tazobactam (ZOSYN ) IVPB 2.25 g        2.25 g 100 mL/hr over 30 Minutes Intravenous Every 8 hours 06/06/24 1017 06/09/24 0538   06/06/24 1200  vancomycin  (VANCOREADY) IVPB 500 mg/100 mL        500 mg 100 mL/hr over 60 Minutes Intravenous  Every M-W-F (Hemodialysis) 06/04/24 1745 06/06/24 1900   06/04/24 1845  vancomycin  (VANCOCIN ) IVPB 1000 mg/200 mL premix        1,000 mg 200 mL/hr over 60 Minutes Intravenous  Once 06/04/24 1745 06/04/24 1915   06/04/24 1830  ceFEPIme  (MAXIPIME ) 2 g in sodium chloride  0.9 % 100 mL IVPB  Status:  Discontinued        2 g 200 mL/hr over 30 Minutes Intravenous Every M-W-F (Hemodialysis) 06/04/24 1743 06/06/24 1017   05/27/24 1015  cefTRIAXone  (ROCEPHIN ) 2 g in sodium chloride  0.9 % 100 mL IVPB        2 g 200 mL/hr over 30 Minutes Intravenous Every 24 hours 05/27/24 0918 05/29/24 1014   05/23/24 1530  ceFEPIme  (MAXIPIME ) 1 g in sodium chloride  0.9 % 100 mL IVPB  Status:  Discontinued        1 g 200 mL/hr over 30 Minutes Intravenous Every 24 hours 05/23/24 1433 05/27/24 0918   05/22/24 2200  Ampicillin -Sulbactam (UNASYN ) 3 g in sodium chloride  0.9 % 100 mL IVPB  Status:  Discontinued        3 g 200 mL/hr over 30 Minutes Intravenous Every 24 hours 05/22/24 0834 05/23/24 1433   05/22/24 0930  Ampicillin -Sulbactam (UNASYN ) 3 g in sodium chloride  0.9 % 100 mL IVPB        3 g 200  mL/hr over 30 Minutes Intravenous  Once 05/22/24 9165 05/22/24 1900         Subjective: Patient seen and examined.  Mother was not present at bedside today.  Patient is fully alert and oriented and she denied having any complaints.  Objective: Vitals:   08/24/24 2112 08/25/24 0019 08/25/24 0500 08/25/24 0825  BP: (!) 144/69 (!) 150/68 (!) 168/68 (!) 150/61  Pulse: 78 77 77 81  Resp: 14 14 18 18   Temp: 98.5 F (36.9 C) 98.8 F (37.1 C) 98.3 F (36.8 C) 98.4 F (36.9 C)  TempSrc: Oral Oral Oral Oral  SpO2: 97% 100% 94% 97%  Weight:      Height:        Intake/Output Summary (Last 24 hours) at 08/25/2024 0907 Last data filed at 08/24/2024 1800 Gross per 24 hour  Intake 360 ml  Output --  Net 360 ml   Filed Weights   08/21/24 1157 08/23/24 0813 08/23/24 1200  Weight: 57.9 kg 54.9 kg 56.7 kg     Examination:  General exam: Appears calm and comfortable  Respiratory system: Clear to auscultation. Respiratory effort normal. Cardiovascular system: S1 & S2 heard, RRR. No JVD, murmurs, rubs, gallops or clicks. No pedal edema. Gastrointestinal system: Abdomen is nondistended, soft and nontender. No organomegaly or masses felt. Normal bowel sounds heard. Central nervous system: Alert and oriented. No focal neurological deficits. Extremities: Symmetric 5 x 5 power. Skin: No rashes, lesions or ulcers Psychiatry: Judgement and insight appear normal. Mood & affect appropriate.    Data Reviewed: I have personally reviewed following labs and imaging studies  CBC: Recent Labs  Lab 08/23/24 0511  WBC 2.9*  HGB 9.0*  HCT 28.9*  MCV 102.1*  PLT 129*   Basic Metabolic Panel: Recent Labs  Lab 08/19/24 0439 08/19/24 1213 08/20/24 0217 08/21/24 0248 08/22/24 0930 08/23/24 0511  NA 138 139 138 136 135 136  K 6.2* 5.7* 4.2 5.2* 4.2 4.7  CL 98 97* 96* 95* 94* 94*  CO2 26 23 29 27 27 29   GLUCOSE 70 112* 82 73 147* 103*  BUN 80* 80* 34* 47* 27* 37*  CREATININE 6.45* 6.68* 3.36* 4.89* 3.64* 4.93*  CALCIUM  9.5 10.4* 9.6 9.5 10.4* 9.8  PHOS 7.9*  --   --   --   --   --    GFR: Estimated Creatinine Clearance: 12.3 mL/min (A) (by C-G formula based on SCr of 4.93 mg/dL (H)). Liver Function Tests: Recent Labs  Lab 08/19/24 0439 08/19/24 1952  AST  --  35  ALT  --  40  ALKPHOS  --  96  BILITOT  --  0.4  PROT  --  6.0*  ALBUMIN  2.9* 3.6   Recent Labs  Lab 08/19/24 1952  LIPASE 34   No results for input(s): AMMONIA in the last 168 hours. Coagulation Profile: No results for input(s): INR, PROTIME in the last 168 hours. Cardiac Enzymes: No results for input(s): CKTOTAL, CKMB, CKMBINDEX, TROPONINI in the last 168 hours. BNP (last 3 results) No results for input(s): PROBNP in the last 8760 hours. HbA1C: No results for input(s): HGBA1C in the last 72  hours. CBG: No results for input(s): GLUCAP in the last 168 hours. Lipid Profile: No results for input(s): CHOL, HDL, LDLCALC, TRIG, CHOLHDL, LDLDIRECT in the last 72 hours. Thyroid Function Tests: No results for input(s): TSH, T4TOTAL, FREET4, T3FREE, THYROIDAB in the last 72 hours. Anemia Panel: No results for input(s): VITAMINB12, FOLATE, FERRITIN, TIBC, IRON , RETICCTPCT in  the last 72 hours. Sepsis Labs: No results for input(s): PROCALCITON, LATICACIDVEN in the last 168 hours.  No results found for this or any previous visit (from the past 240 hours).   Radiology Studies: No results found.  Scheduled Meds:  amLODipine   10 mg Oral Daily   carvedilol   25 mg Oral BID WC   Chlorhexidine  Gluconate Cloth  6 each Topical Q0600   cinacalcet   30 mg Oral Q M,W,F   cycloSPORINE  modified  200 mg Oral QPM   darbepoetin (ARANESP ) injection - DIALYSIS  200 mcg Subcutaneous Q Thu-1800   diclofenac  Sodium  2 g Topical QID   famotidine   10 mg Oral QHS   gabapentin   100 mg Oral Daily   hydrALAZINE   100 mg Oral Q8H    HYDROmorphone  (DILAUDID ) injection  0.25 mg Intravenous Once   lacosamide   100 mg Oral BID   multivitamin  1 tablet Oral QHS   mycophenolate   500 mg Oral BID   pantoprazole   40 mg Oral Daily   predniSONE   15 mg Oral Q breakfast   senna-docusate  1 tablet Oral QHS   sevelamer  carbonate  1,600 mg Oral TID WC   sodium zirconium cyclosilicate   10 g Oral Daily   traZODone   50 mg Oral QHS   Continuous Infusions:  cycloSPORINE  modified (GENGRAF ) 225 mg       LOS: 98 days   Fredia Skeeter, MD [name]  08/25/2024, 9:07 AM   *Please note that this is a verbal dictation therefore any spelling or grammatical errors are due to the Dragon Medical One system interpretation.  Please page via Amion and do not message via secure chat for urgent patient care matters. Secure chat can be used for non urgent patient care matters.  How  to contact the [facility] Attending or Consulting provider 7A - 7P or covering provider during after hours 7P -7A, for this patient?  Check the care team in [facility] and look for a) attending/consulting [facility] provider listed and b) the [facility] team listed. Page or secure chat 7A-7P. Log into [email or web address] and use [facility] universal password to access. If you do not have the password, please contact the hospital operator. Locate the [facility] provider you are looking for under [name] and page to a number that you can be directly reached. If you still have difficulty reaching the provider, please page the [facility] (Director on Call) for the Hospitalists listed on amion for assistance.  "

## 2024-08-25 NOTE — Progress Notes (Signed)
 [facility] NEPHROLOGY PROGRESS NOTE  Assessment/ Plan: Pt is a 43 y.o. yo female   Dialysis Orders: 3:45hr, 400/A1.5, EDW 55.2kg, 2K/2Ca bath, AVF, no heparin  - Prev on Mircera 150mcg IV q 2 weeks - No VDRA  # Acute R intracranial hemorrhage: 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 Needs ongoing rehab. Doing a lot better, memory and alertness much improved.   #ESRD: usual MWF but getting HD on TTS schedule this week, HD today.  #Hyperkalemia: Now on Lokelma  daily.  #HTN/volume: BP higher lately, pulm edema on CXR. Continue carvedilol  25mg  BID, hydralazine  100mg  TID, and amlodipine  10 mg daily. Push UF as tolerated.  #Anemia of ESRD: Hgb 8s > 9s, transfuse prn. Continue Aranesp  200 q Thurs. 12/6 iron  sat 31%.   #Secondary HPTH: Ca ok, last Phos high and sevelamer  dose increased. Continue sensipar , not on VDRA (PTH 126). Check phos level.   #Hx pancreas/kidney transplant: Remains on IS for pancreas function. #Dispo: Insurance declined LTAC, and SNF, appeal pending. Working to go home. Tolerated HD in recliner on multiple days.  Subjective: Seen and examined.  Patient is more alert awake than before.  Denies nausea, vomiting, chest pain, shortness of breath.  Her mother was present at the bedside.  Plan for dialysis today. Objective Vital signs in last 24 hours: Vitals:   08/24/24 2112 08/25/24 0019 08/25/24 0500 08/25/24 0825  BP: (!) 144/69 (!) 150/68 (!) 168/68 (!) 150/61  Pulse: 78 77 77 81  Resp: 14 14 18 18   Temp: 98.5 F (36.9 C) 98.8 F (37.1 C) 98.3 F (36.8 C) 98.4 F (36.9 C)  TempSrc: Oral Oral Oral Oral  SpO2: 97% 100% 94% 97%  Weight:      Height:       Weight change:   Intake/Output Summary (Last 24 hours) at 08/25/2024 1116 Last data filed at 08/24/2024 1800 Gross per 24 hour  Intake 360 ml  Output --  Net 360 ml       Labs: RENAL PANEL Recent Labs  Lab 08/19/24 0439 08/19/24 1213 08/19/24 1952 08/20/24 0217 08/21/24 0248  08/22/24 0930 08/23/24 0511  NA 138 139  --  138 136 135 136  K 6.2* 5.7*  --  4.2 5.2* 4.2 4.7  CL 98 97*  --  96* 95* 94* 94*  CO2 26 23  --  29 27 27 29   GLUCOSE 70 112*  --  82 73 147* 103*  BUN 80* 80*  --  34* 47* 27* 37*  CREATININE 6.45* 6.68*  --  3.36* 4.89* 3.64* 4.93*  CALCIUM  9.5 10.4*  --  9.6 9.5 10.4* 9.8  PHOS 7.9*  --   --   --   --   --   --   ALBUMIN  2.9*  --  3.6  --   --   --   --     Liver Function Tests: Recent Labs  Lab 08/19/24 0439 08/19/24 1952  AST  --  35  ALT  --  40  ALKPHOS  --  96  BILITOT  --  0.4  PROT  --  6.0*  ALBUMIN  2.9* 3.6   Recent Labs  Lab 08/19/24 1952  LIPASE 34   No results for input(s): AMMONIA in the last 168 hours. CBC: Recent Labs    02/21/24 0845 02/22/24 0510 03/01/24 1408 03/02/24 9278 06/07/24 0902 06/08/24 0425 08/09/24 1036 08/11/24 0344 08/12/24 0829 08/14/24 0230 08/16/24 0657 08/23/24 0511  HGB 10.3*   < >  --    < >  8.6*   < >  --  8.7* 8.8* 9.0* 9.0* 9.0*  MCV 94.6   < >  --    < > 96.8   < >  --  99.6 104.3* 102.5* 104.6* 102.1*  VITAMINB12 341  --  529  --   --   --   --   --   --   --   --   --   FOLATE 16.8  --  16.6  --  >20.0  --   --   --   --   --   --   --   FERRITIN  --   --  1,658*  --   --   --  615*  --   --   --   --   --   TIBC  --   --  225*  --   --   --  259  --   --   --   --   --   IRON   --   --  79  --   --   --  81  --   --   --   --   --   RETICCTPCT  --   --  2.4  --   --   --   --   --   --   --   --   --    < > = values in this interval not displayed.    Cardiac Enzymes: No results for input(s): CKTOTAL, CKMB, CKMBINDEX, TROPONINI in the last 168 hours. CBG: No results for input(s): GLUCAP in the last 168 hours.  Iron  Studies: No results for input(s): IRON , TIBC, TRANSFERRIN, FERRITIN in the last 72 hours. Studies/Results: No results found.  Medications: Infusions:  cycloSPORINE  modified (GENGRAF ) 225 mg      Scheduled Medications:   amLODipine   10 mg Oral Daily   carvedilol   25 mg Oral BID WC   Chlorhexidine  Gluconate Cloth  6 each Topical Q0600   cinacalcet   30 mg Oral Q M,W,F   cycloSPORINE  modified  200 mg Oral QPM   darbepoetin (ARANESP ) injection - DIALYSIS  200 mcg Subcutaneous Q Thu-1800   diclofenac  Sodium  2 g Topical QID   famotidine   10 mg Oral QHS   gabapentin   100 mg Oral Daily   hydrALAZINE   100 mg Oral Q8H    HYDROmorphone  (DILAUDID ) injection  0.25 mg Intravenous Once   lacosamide   100 mg Oral BID   multivitamin  1 tablet Oral QHS   mycophenolate   500 mg Oral BID   pantoprazole   40 mg Oral Daily   predniSONE   15 mg Oral Q breakfast   senna-docusate  1 tablet Oral QHS   sevelamer  carbonate  1,600 mg Oral TID WC   sodium zirconium cyclosilicate   10 g Oral Daily   traZODone   50 mg Oral QHS    have reviewed scheduled and prn medications.  Physical Exam: General:NAD, comfortable Heart:RRR, s1s2 nl Lungs:clear b/l, no crackle Abdomen:soft,  non-distended Extremities:No edema Dialysis Access: AVF thrill+   Alexius Ellington Prasad Derek Huneycutt 08/25/2024,11:16 AM  LOS: 98 days

## 2024-08-25 NOTE — Progress Notes (Addendum)
 Received patient in bed to unit.  Alert and oriented.  Informed consent signed and in chart.   TX duration:3.5  Patient tolerated well.  Transported back to the room  Alert, without acute distress.  Hand-off given to patient's nurse.   Access used: Right upper arm fistula Access issues: none  Total UF removed: 2L Medication(s) given: Benedryl   08/25/24 1726  Vitals  Temp 98.6 F (37 C)  Temp Source Oral  BP (!) 126/54  MAP (mmHg) 76  Pulse Rate 86  ECG Heart Rate 85  Resp 17  Oxygen  Therapy  SpO2 97 %  O2 Device Room Air  During Treatment Monitoring  Duration of HD Treatment -hour(s) 3.5 hour(s)  HD Safety Checks Performed Yes  Intra-Hemodialysis Comments Tx completed  Post Treatment  Dialyzer Clearance Lightly streaked  Liters Processed 63  Fluid Removed (mL) 2000 mL  Tolerated HD Treatment Yes  Post-Hemodialysis Comments tolerated well, UF goal 2L met  AVG/AVF Arterial Site Held (minutes) 7 minutes  AVG/AVF Venous Site Held (minutes) 7 minutes  Fistula / Graft Right Upper arm Arteriovenous fistula  Placement Date/Time: (c) 12/08/20 1403   Placed prior to admission: Yes  Orientation: Right  Access Location: Upper arm  Access Type: Arteriovenous fistula  Site Condition No complications  Fistula / Graft Assessment Present;Thrill;Bruit  Status Patent;Deaccessed  Drainage Description None     Camellia Brasil LPN [facility]

## 2024-08-25 NOTE — Progress Notes (Signed)
 Occupational Therapy Treatment Patient Details Name: [name] [name] MRN: [id number] DOB: April 15, 1981 Today's Date: 08/25/2024   History of present illness 43 yo female presents 05/19/24 for left gaze and L side weakness. 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 Intubated 9/15-9/23. EVD 9/15- 10/15; s/p shunt 10/15. S/p laparoscopy with intra-abdominal assistance ventricular-peritoneal shunt placement 10/15. PMH: HTN, ESRD on HD s/p renal and pancreatic transplants, DM, gastroparesis, multiple strokes (ischemic and hemorrhagic) with prepontine SAH, hydrocephalus requiring EVD placement   OT comments  Patient continues to demonstrate good gains with bed mobility, self care, transfers, and functional mobility.  Discharge recommendations for home with [facility] and assistance from mother continues to be appropriate. Acute OT to continue to follow.       If plan is discharge home, recommend the following:  Assistance with cooking/housework;Assist for transportation;Help with stairs or ramp for entrance;A little help with walking and/or transfers;Direct supervision/assist for medications management;Direct supervision/assist for financial management;Supervision due to cognitive status;A little help with bathing/dressing/bathroom   Equipment Recommendations  BSC/3in1;Tub/shower bench    Recommendations for Other Services      Precautions / Restrictions Precautions Precautions: Fall Recall of Precautions/Restrictions: Impaired Precaution/Restrictions Comments: PEG Restrictions Weight Bearing Restrictions Per Provider Order: No       Mobility Bed Mobility Overal bed mobility: Needs Assistance Bed Mobility: Supine to Sit, Sit to Supine     Supine to sit: Supervision Sit to supine: Supervision        Transfers Overall transfer level: Needs assistance Equipment used:  Rollator (4 wheels) Transfers: Sit to/from Stand Sit to Stand: Contact guard assist           General transfer comment: performed mobility and transfers with CGA for safety with occasional cues for locking and unlocking rollator brakes     Balance Overall balance assessment: Needs assistance Sitting-balance support: No upper extremity supported, Feet supported Sitting balance-Leahy Scale: Good Sitting balance - Comments: seated EOB, no LOB   Standing balance support: Bilateral upper extremity supported, During functional activity, Reliant on assistive device for balance Standing balance-Leahy Scale: Poor Standing balance comment: reliant on Rollator for support                           ADL either performed or assessed with clinical judgement   ADL Overall ADL's : Needs assistance/impaired     Grooming: Contact guard assist;Standing;Wash/dry face;Oral care           Upper Body Dressing : Set up;Sitting Upper Body Dressing Details (indicate cue type and reason): gown to cover back Lower Body Dressing: Set up;Sitting/lateral leans Lower Body Dressing Details (indicate cue type and reason): donning socks                    Extremity/Trunk Assessment              Vision       Perception     Praxis     Communication Communication Communication: No apparent difficulties   Cognition Arousal: Alert Behavior During Therapy: WFL for tasks assessed/performed Cognition: No apparent impairments                               Following commands: Impaired Following commands impaired: Follows one step commands with increased time, Follows multi-step commands inconsistently  Cueing   Cueing Techniques: Verbal cues, Gestural cues  Exercises      Shoulder Instructions       General Comments mother present for caregiver training    Pertinent Vitals/ Pain       Pain Assessment Pain Assessment: Faces Faces Pain Scale: No  hurt  Home Living                                          Prior Functioning/Environment              Frequency  Min 2X/week        Progress Toward Goals  OT Goals(current goals can now be found in the care plan section)  Progress towards OT goals: Progressing toward goals  ADL Goals Additional ADL Goal #3: Pt will transfer in/out of tub/shower via tub transfer bench with close supervision.  Plan      Co-evaluation                 AM-PAC OT 6 Clicks Daily Activity     Outcome Measure   Help from another person eating meals?: A Little Help from another person taking care of personal grooming?: A Little Help from another person toileting, which includes using toliet, bedpan, or urinal?: A Little Help from another person bathing (including washing, rinsing, drying)?: A Little Help from another person to put on and taking off regular upper body clothing?: A Little Help from another person to put on and taking off regular lower body clothing?: A Little 6 Click Score: 18    End of Session Equipment Utilized During Treatment: Gait belt;Rollator (4 wheels)  OT Visit Diagnosis: Unsteadiness on feet (R26.81);Muscle weakness (generalized) (M62.81);History of falling (Z91.81);Other symptoms and signs involving cognitive function;Cognitive communication deficit (R41.841);Low vision, both eyes (H54.2)   Activity Tolerance Patient tolerated treatment well   Patient Left in bed;with call bell/phone within reach;with bed alarm set;with family/visitor present   Nurse Communication Mobility status        Time: 8991-8965 OT Time Calculation (min): 26 min  Charges: OT General Charges $OT Visit: 1 Visit OT Treatments $Self Care/Home Management : 8-22 mins $Therapeutic Activity: 8-22 mins  [name], OTA Acute Rehabilitation Services  Office [phone]   [name] 08/25/2024, 1:44 PM

## 2024-08-26 ENCOUNTER — Encounter (HOSPITAL_COMMUNITY): Payer: Self-pay

## 2024-08-26 ENCOUNTER — Other Ambulatory Visit (HOSPITAL_COMMUNITY): Payer: Self-pay

## 2024-08-26 DIAGNOSIS — I619 Nontraumatic intracerebral hemorrhage, unspecified: Secondary | ICD-10-CM | POA: Diagnosis not present

## 2024-08-26 MED ORDER — MYCOPHENOLATE 200 MG/ML ORAL SUSPENSION
500.0000 mg | Freq: Two times a day (BID) | ORAL | 0 refills | Status: AC
  Filled 2024-08-26: qty 160, 32d supply, fill #0

## 2024-08-26 MED ORDER — AMLODIPINE BESYLATE 10 MG PO TABS
10.0000 mg | ORAL_TABLET | Freq: Every day | ORAL | 2 refills | Status: AC
  Filled 2024-08-26: qty 30, 30d supply, fill #0

## 2024-08-26 MED ORDER — CARVEDILOL 25 MG PO TABS
25.0000 mg | ORAL_TABLET | Freq: Two times a day (BID) | ORAL | 0 refills | Status: AC
  Filled 2024-08-26: qty 60, 30d supply, fill #0

## 2024-08-26 MED ORDER — PREDNISONE 5 MG PO TABS
15.0000 mg | ORAL_TABLET | Freq: Every day | ORAL | 0 refills | Status: AC
  Filled 2024-08-26: qty 90, 30d supply, fill #0

## 2024-08-26 MED ORDER — HYDRALAZINE HCL 100 MG PO TABS
100.0000 mg | ORAL_TABLET | Freq: Three times a day (TID) | ORAL | 0 refills | Status: AC
  Filled 2024-08-26: qty 90, 30d supply, fill #0

## 2024-08-26 MED ORDER — OXYCODONE-ACETAMINOPHEN 5-325 MG PO TABS
1.0000 | ORAL_TABLET | Freq: Three times a day (TID) | ORAL | 0 refills | Status: AC
  Filled 2024-08-26: qty 30, 10d supply, fill #0
  Filled 2024-08-26: qty 21, 7d supply, fill #0

## 2024-08-26 MED ORDER — LACOSAMIDE 100 MG PO TABS
100.0000 mg | ORAL_TABLET | Freq: Two times a day (BID) | ORAL | 0 refills | Status: AC
  Filled 2024-08-26: qty 60, 30d supply, fill #0

## 2024-08-26 MED ORDER — GABAPENTIN 100 MG PO CAPS
100.0000 mg | ORAL_CAPSULE | Freq: Every day | ORAL | 0 refills | Status: AC
  Filled 2024-08-26: qty 90, 90d supply, fill #0

## 2024-08-26 MED ORDER — PANTOPRAZOLE SODIUM 40 MG PO TBEC
40.0000 mg | DELAYED_RELEASE_TABLET | Freq: Every day | ORAL | 0 refills | Status: AC
  Filled 2024-08-26: qty 30, 30d supply, fill #0

## 2024-08-26 MED ORDER — TRAZODONE HCL 50 MG PO TABS
50.0000 mg | ORAL_TABLET | Freq: Every day | ORAL | 0 refills | Status: AC
  Filled 2024-08-26: qty 30, 30d supply, fill #0

## 2024-08-26 NOTE — Progress Notes (Signed)
 Physical Therapy Treatment Patient Details Name: [name] [name] MRN: [id number] DOB: [birth year] Today's Date: 08/26/2024   History of Present Illness 43 yo female presents 05/19/24 for left gaze and L side weakness. 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 Intubated 9/15-9/23. EVD 9/15- 10/15; s/p shunt 10/15. S/p laparoscopy with intra-abdominal assistance ventricular-peritoneal shunt placement 10/15. PMH: HTN, ESRD on HD s/p renal and pancreatic transplants, DM, gastroparesis, multiple strokes (ischemic and hemorrhagic) with prepontine SAH, hydrocephalus requiring EVD placement    PT Comments  Pt tolerated treatment well today. Pt today was able to ambulate around unit with rollator at supervision/CGA level. Pt required 2 seated rest breaks today demonstrating improved endurance. No change in DC/DME recs at this time. PT will continue to follow.      If plan is discharge home, recommend the following: A little help with walking and/or transfers;A little help with bathing/dressing/bathroom;Assistance with cooking/housework;Assist for transportation;Help with stairs or ramp for entrance   Can travel by private vehicle        Equipment Recommendations  Rollator (4 wheels);BSC/3in1;Wheelchair (measurements PT);Wheelchair cushion (measurements PT)    Recommendations for Other Services       Precautions / Restrictions Precautions Precautions: Fall Recall of Precautions/Restrictions: Impaired Precaution/Restrictions Comments: PEG Restrictions Weight Bearing Restrictions Per Provider Order: No     Mobility  Bed Mobility Overal bed mobility: Needs Assistance Bed Mobility: Supine to Sit, Sit to Supine     Supine to sit: Supervision Sit to supine: Supervision        Transfers Overall transfer level: Needs assistance Equipment used: Rollator (4  wheels) Transfers: Sit to/from Stand Sit to Stand: Contact guard assist           General transfer comment: performed mobility and transfers with CGA for safety with occasional cues for locking and unlocking rollator brakes    Ambulation/Gait Ambulation/Gait assistance: Contact guard assist, Supervision Gait Distance (Feet): 450 Feet Assistive device: Rollator (4 wheels) Gait Pattern/deviations: Step-through pattern, Decreased stride length, Drifts right/left Gait velocity: decr     General Gait Details: Pt today able to ambulate around unit with rollator supervision/CGA. Occasional cues for locking brakes. 2 seated rest breaks.   Stairs             Wheelchair Mobility     Tilt Bed    Modified Rankin (Stroke Patients Only)       Balance Overall balance assessment: Needs assistance Sitting-balance support: No upper extremity supported, Feet supported Sitting balance-Leahy Scale: Good Sitting balance - Comments: seated EOB, no LOB   Standing balance support: Bilateral upper extremity supported, During functional activity, Reliant on assistive device for balance Standing balance-Leahy Scale: Poor Standing balance comment: reliant on Rollator for support                            Communication Communication Communication: No apparent difficulties  Cognition Arousal: Alert Behavior During Therapy: WFL for tasks assessed/performed                             Following commands: Impaired Following commands impaired: Follows one step commands with increased time, Follows multi-step commands inconsistently    Cueing Cueing Techniques: Verbal cues, Gestural cues  Exercises      General Comments General comments (skin integrity, edema, etc.): Mother present  Pertinent Vitals/Pain Pain Assessment Pain Assessment: Faces Faces Pain Scale: No hurt    Home Living                          Prior Function             PT Goals (current goals can now be found in the care plan section) Progress towards PT goals: Progressing toward goals    Frequency    Min 2X/week      PT Plan      Co-evaluation              AM-PAC PT 6 Clicks Mobility   Outcome Measure  Help needed turning from your back to your side while in a flat bed without using bedrails?: A Little Help needed moving from lying on your back to sitting on the side of a flat bed without using bedrails?: A Little Help needed moving to and from a bed to a chair (including a wheelchair)?: A Little Help needed standing up from a chair using your arms (e.g., wheelchair or bedside chair)?: A Little Help needed to walk in hospital room?: A Little Help needed climbing 3-5 steps with a railing? : A Lot 6 Click Score: 17    End of Session Equipment Utilized During Treatment: Gait belt Activity Tolerance: Patient tolerated treatment well Patient left: in bed;with call bell/phone within reach;with bed alarm set;with family/visitor present Nurse Communication: Mobility status PT Visit Diagnosis: Unsteadiness on feet (R26.81);Other abnormalities of gait and mobility (R26.89);Muscle weakness (generalized) (M62.81);Difficulty in walking, not elsewhere classified (R26.2);Other symptoms and signs involving the nervous system (R29.898)     Time: 8665-8645 PT Time Calculation (min) (ACUTE ONLY): 20 min  Charges:    $Gait Training: 8-22 mins PT General Charges $$ ACUTE PT VISIT: 1 Visit                     Sueellen NOVAK, PT, DPT Acute Rehab Services [phone]    Brad Mcgaughy 08/26/2024, 2:54 PM

## 2024-08-26 NOTE — Progress Notes (Signed)
 [name] NEPHROLOGY PROGRESS NOTE  Assessment/ Plan: Pt is a 43 y.o. yo female   Dialysis Orders: 3:45hr, 400/A1.5, EDW 55.2kg, 2K/2Ca bath, AVF, no heparin  - Prev on Mircera 150mcg IV q 2 weeks - No VDRA  # Acute R intracranial hemorrhage: 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 Needs ongoing rehab. Doing a lot better, memory and alertness much improved.   #ESRD: usual MWF but getting HD on TTS schedule this week, HD tomorrow.  Need outpatient HD arrangement including transportation before discharge.  #Hyperkalemia: Now on Lokelma  daily.  #HTN/volume: BP higher lately, pulm edema on CXR. Continue carvedilol  25mg  BID, hydralazine  100mg  TID, and amlodipine  10 mg daily.  Receiving medicine this morning and monitor BP.  Push UF as tolerated.  #Anemia of ESRD: Hgb 8s > 9s, transfuse prn. Continue Aranesp  200 q Thurs. 12/6 iron  sat 31%.   #Secondary HPTH: Ca ok, last Phos high and sevelamer  dose increased. Continue sensipar , not on VDRA (PTH 126). Check phos level.   #Hx pancreas/kidney transplant: Remains on IS for pancreas function. #Dispo: Insurance declined LTAC, and SNF, appeal pending. Working to go home. Tolerated HD in recliner on multiple days.  Subjective: Seen and examined.  No new event.  Denies nausea, vomiting, chest pain, shortness of breath.  She had dialysis yesterday with 2 L UF.  Her mother at the bedside.  Objective Vital signs in last 24 hours: Vitals:   08/25/24 1731 08/25/24 2152 08/26/24 0528 08/26/24 0725  BP: (!) 144/59 (!) 163/76 (!) 173/70 (!) 175/65  Pulse: 88 80 75 76  Resp: 19 18 16 16   Temp:  98 F (36.7 C) 98.2 F (36.8 C) 98.5 F (36.9 C)  TempSrc:  Oral Oral Oral  SpO2: 98% 100% 96% 98%  Weight:      Height:       Weight change:   Intake/Output Summary (Last 24 hours) at 08/26/2024 1034 Last data filed at 08/25/2024 1726 Gross per 24 hour  Intake --  Output 2000 ml  Net -2000 ml       Labs: RENAL PANEL Recent  Labs  Lab 08/19/24 1952 08/20/24 0217 08/21/24 0248 08/22/24 0930 08/23/24 0511 08/25/24 1330  NA  --  138 136 135 136 136  K  --  4.2 5.2* 4.2 4.7 4.9  CL  --  96* 95* 94* 94* 94*  CO2  --  29 27 27 29 27   GLUCOSE  --  82 73 147* 103* 150*  BUN  --  34* 47* 27* 37* 43*  CREATININE  --  3.36* 4.89* 3.64* 4.93* 5.47*  CALCIUM   --  9.6 9.5 10.4* 9.8 10.4*  PHOS  --   --   --   --   --  5.6*  ALBUMIN  3.6  --   --   --   --  3.8    Liver Function Tests: Recent Labs  Lab 08/19/24 1952 08/25/24 1330  AST 35  --   ALT 40  --   ALKPHOS 96  --   BILITOT 0.4  --   PROT 6.0*  --   ALBUMIN  3.6 3.8   Recent Labs  Lab 08/19/24 1952  LIPASE 34   No results for input(s): AMMONIA in the last 168 hours. CBC: Recent Labs    02/21/24 0845 02/22/24 0510 03/01/24 1408 03/02/24 9278 06/07/24 0902 06/08/24 0425 08/09/24 1036 08/11/24 0344 08/12/24 0829 08/14/24 0230 08/16/24 0657 08/23/24 0511 08/25/24 1300  HGB 10.3*   < >  --    < >  8.6*   < >  --    < > 8.8* 9.0* 9.0* 9.0* 9.8*  MCV 94.6   < >  --    < > 96.8   < >  --    < > 104.3* 102.5* 104.6* 102.1* 101.9*  VITAMINB12 341  --  529  --   --   --   --   --   --   --   --   --   --   FOLATE 16.8  --  16.6  --  >20.0  --   --   --   --   --   --   --   --   FERRITIN  --   --  1,658*  --   --   --  615*  --   --   --   --   --   --   TIBC  --   --  225*  --   --   --  259  --   --   --   --   --   --   IRON   --   --  79  --   --   --  81  --   --   --   --   --   --   RETICCTPCT  --   --  2.4  --   --   --   --   --   --   --   --   --   --    < > = values in this interval not displayed.    Cardiac Enzymes: No results for input(s): CKTOTAL, CKMB, CKMBINDEX, TROPONINI in the last 168 hours. CBG: No results for input(s): GLUCAP in the last 168 hours.  Iron  Studies: No results for input(s): IRON , TIBC, TRANSFERRIN, FERRITIN in the last 72 hours. Studies/Results: No results  found.  Medications: Infusions:    Scheduled Medications:  amLODipine   10 mg Oral Daily   carvedilol   25 mg Oral BID WC   Chlorhexidine  Gluconate Cloth  6 each Topical Q0600   cinacalcet   30 mg Oral Q M,W,F   cycloSPORINE  modified  200 mg Oral Daily   And   cycloSPORINE  modified  25 mg Oral Daily   cycloSPORINE  modified  200 mg Oral QPM   darbepoetin (ARANESP ) injection - DIALYSIS  200 mcg Subcutaneous Q Thu-1800   diclofenac  Sodium  2 g Topical QID   famotidine   10 mg Oral QHS   gabapentin   100 mg Oral Daily   hydrALAZINE   100 mg Oral Q8H    HYDROmorphone  (DILAUDID ) injection  0.25 mg Intravenous Once   lacosamide   100 mg Oral BID   multivitamin  1 tablet Oral QHS   mycophenolate   500 mg Oral BID   pantoprazole   40 mg Oral Daily   predniSONE   15 mg Oral Q breakfast   senna-docusate  1 tablet Oral QHS   sevelamer  carbonate  1,600 mg Oral TID WC   sodium zirconium cyclosilicate   10 g Oral Daily   traZODone   50 mg Oral QHS    have reviewed scheduled and prn medications.  Physical Exam: General:NAD, comfortable Heart:RRR, s1s2 nl Lungs:clear b/l, no crackle Abdomen:soft,  non-distended Extremities:No edema Dialysis Access: AVF thrill+   [name] [name] [name] [name] 08/26/2024,10:34 AM  LOS: 99 days

## 2024-08-26 NOTE — Discharge Summary (Addendum)
 Physician Discharge Summary  [name] [name] Issac [name]:[id number] DOB: May 15, 1981 DOA: 05/19/2024  PCP: [name]  Admit date: 05/19/2024 Discharge date: 08/27/2024 30 Day Unplanned Readmission Risk Score    Flowsheet Row ED to Hosp-Admission (Current) from 05/19/2024 in [name]  30 Day Unplanned Readmission Risk Score (%) 66.53 Filed at 08/26/2024 1600    This score is the patient's risk of an unplanned readmission within 30 days of being discharged (0 -100%). The score is based on dignosis, age, lab data, medications, orders, and past utilization.   Low:  0-14.9   Medium: 15-21.9   High: 22-29.9   Extreme: 30 and above          Admitted From: Home Disposition: Home  Recommendations for Outpatient Follow-up:  Follow up with PCP in 1-2 weeks Please obtain BMP/CBC in one week Follow-up with outpatient dialysis Monday Wednesday Friday Please follow up with your PCP on the following pending results: Unresulted Labs (From admission, onward)    None         Home Health: Yes Equipment/Devices: Walker  Discharge Condition: Stable CODE STATUS: Full code Diet recommendation:  Diet Order             Diet regular Room service appropriate? Yes with Assist; Fluid consistency: Thin  Diet effective now                   Subjective: Seen and examined during dialysis, no complaints.  Very excited about going home.  Answered all the questions she had about her medications.  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  Patient was noted to have hemorrhagic stroke.  Neurology, neurosurgery in critical care were consulted.  EVD was placed on the left side and was  initially admitted to neuro ICU.  She was also intubated and had prolonged intubation.  Subsequently, patient was extubated on 05/27/2024.   Important events:  9/15 admitted with ICH plus IVH, EVD was placed for hydrocephalus, intubated 9/22 palliative care meeting.  Family is insistent that they want full scope of care 9/23 Extubated 10/1 -developed fevers pancultures drawn and broad-spectrum antibiotics started 10/2 became afebrile after starting antibiotics, did not tolerate raising EVD 10/6 EVD was raised from 10 to 30 cm water , tolerating well, remained afebrile 10/7 EVD is at 30 cm water , CT scan will be done tomorrow morning, no overnight issues 10/8 she became somnolent, CT head showed increasing hydrocephalus, EVD was titrated down to 10 cm of water  with improvement in mental status 10/15 VP shunt placement 10/17 transferred to progressive unit with [name]  11/18-medically stable.  Awaiting for disposition.  Insurance declined LTAC and skilled nursing facility, appeal pending. 12/3-tube feeding changed to nocturnal tube feeds, started on p.o. diet 12/9 nocturnal tube feeds d/c'd.  Details after that.   Acute right parietotemporal intraparenchymal hemorrhage / Intraventricular hemorrhage secondary to uncontrolled hypertension: Obstructive hydrocephalus s/p EVD changed to VP shunt: Seizure disorder S/p VP shunt placement on 06/18/2024 by Dr. Mavis.  Neurosurgery intermittently followed.  Patient has been seizure-free for several days, maintained on Vimpat  100 mg p.o. twice daily.   ESRD on dialysis: History of failed renal transplant: Secondary hyperparathyroidism: Hyperkalemia  Nephrology on board, patient received Monday Wednesday Friday dialysis.  Continue CellCept  and prednisone .  On Monday, Wednesday, Friday hemodialysis schedule.   Can resume outpatient HD center at discharge [facility] on MWF ) tomorrow.   Depressed Mood C/o feeling sad some days when she returns from  dialysis.  Continues to denie SI.  Offered antidepressant, she declines however patient has improved significantly for quite some time now.   Headache resolved  Right Upper Quadrant Pain RUQ US  with cholelithiasis  resolved   Hyponatremia improved   History of pancreatic/kidney transplantation:  Continue cyclosporine , mycophenolate , and prednisone  (will continue 15 mg dose based on discussion with renal)   Hypertension:  -Continue Coreg  25 mg p.o. twice daily -amlodipine  10 mg  - hydralazine  (increase to 100 q8)  - losartan  d/c'd with hyperkalemia Blood pressure fairly controlled.     Dizziness -resolved    Anemia of chronic disease:  Stable Transfuse if Hb below 7.0   Type 1 diabetes with vasculopathy/gastroparesis:  Not on long-term insulin  anymore due to pancreatic transplant.  A1c 3.9 Follow BG intermittently on BMP    Moderate protein calorie malnutrition/dysphagia:  S/p PEG She's taking PO well, tube feeds d/c'd   Insomnia Trial trazodone  which has been successful, discharging on 30 more pills.   Debility/deconditioning/disposition/goals of care:  Palliative care had seen the patient during hospitalization.  Palliative care has signed off 10/17.  Discharging home with home health PT OT.   Discharge plan was discussed with patient and/or family member and they verbalized understanding and agreed with it.  Discharge Diagnoses:  Principal Problem:   Hemorrhagic stroke [name]) Active Problems:   Moderate protein-calorie malnutrition   Pressure injury of skin   Nontraumatic subcortical hemorrhage of right cerebral hemisphere [facility])    Discharge Instructions   Allergies as of 08/27/2024   No Known Allergies      Medication List     STOP taking these medications    albuterol  (2.5 MG/3ML) 0.083% nebulizer solution Commonly known as: PROVENTIL    aspirin  EC 81 MG tablet   busPIRone  10 MG tablet Commonly known as: BUSPAR    cloNIDine  0.3 mg/24hr  patch Commonly known as: CATAPRES  - Dosed in mg/24 hr   cycloSPORINE  25 MG capsule Commonly known as: SANDIMMUNE    DULoxetine  20 MG capsule Commonly known as: CYMBALTA    feeding supplement (NEPRO CARB STEADY) Liqd   Gerhardt's butt cream Crea   hydrOXYzine  25 MG capsule Commonly known as: VISTARIL    lidocaine -prilocaine  cream Commonly known as: EMLA    metoCLOPramide  5 MG tablet Commonly known as: REGLAN    metoprolol  tartrate 50 MG tablet Commonly known as: LOPRESSOR    multivitamin Tabs tablet   Xphozah 30 MG Tabs Generic drug: Tenapanor HCl (CKD)   zolpidem  10 MG tablet Commonly known as: AMBIEN        TAKE these medications    amLODipine  10 MG tablet Commonly known as: NORVASC  Take 1 tablet (10 mg total) by mouth daily.   artificial tears ophthalmic solution Place 1 drop into the left eye as needed for dry eyes.   Auryxia  1 GM 210 MG(Fe) tablet Generic drug: ferric citrate  Take 210-630 mg by mouth See admin instructions. Take 3 tablets (630 mg) by mouth three times daily with meals, and take 1 tablet (210 mg) twice daily with snacks   carvedilol  25 MG tablet Commonly known as: COREG  Take 1 tablet (25 mg total) by mouth 2 (two) times daily with a meal.   cinacalcet  30 MG tablet Commonly known as: SENSIPAR  Take 3 tablets (90 mg total) by mouth daily with supper.  cycloSPORINE  modified 100 MG capsule Commonly known as: NEORAL  Take 2 capsules (200 mg total) by mouth 2 (two) times daily.   Darbepoetin Alfa  60 MCG/0.3ML Sosy injection Commonly known as: ARANESP  Inject 0.3 mLs (60 mcg total) into the vein every Friday with hemodialysis.   doxercalciferol  4 MCG/2ML injection Commonly known as: HECTOROL  Inject 3 mLs (6 mcg total) into the vein every Monday, Wednesday, and Friday with hemodialysis.   gabapentin  100 MG capsule Commonly known as: NEURONTIN  Take 1 capsule (100 mg total) by mouth daily. What changed: when to take this   hydrALAZINE  100 MG  tablet Commonly known as: APRESOLINE  Take 1 tablet (100 mg total) by mouth every 8 (eight) hours. What changed:  medication strength how much to take   Lacosamide  100 MG Tabs Commonly known as: Vimpat  Take 1 tablet (100 mg total) by mouth 2 (two) times daily.   melatonin 3 MG Tabs tablet Take 1 tablet (3 mg total) by mouth at bedtime as needed (insomnia). What changed:  medication strength how much to take when to take this reasons to take this   mycophenolate  200 MG/ML suspension Commonly known as: CELLCEPT  Take 2.5 mLs (500 mg total) by mouth 2 (two) times daily.   ondansetron  4 MG tablet Commonly known as: ZOFRAN  Take 1 tablet (4 mg total) by mouth every 8 (eight) hours as needed for nausea or vomiting.   oxyCODONE -acetaminophen  5-325 MG tablet Commonly known as: PERCOCET/ROXICET Take 1 tablet by mouth 3 (three) times daily.   pantoprazole  40 MG tablet Commonly known as: PROTONIX  Take 1 tablet (40 mg total) by mouth daily.   predniSONE  5 MG tablet Commonly known as: DELTASONE  Take 3 tablets (15 mg total) by mouth daily.   sevelamer  carbonate 2.4 g Pack Commonly known as: RENVELA  Take 2.4 g by mouth 3 (three) times daily with meals.   simethicone  40 MG/0.6ML drops Commonly known as: MYLICON Take 0.6 mLs (40 mg total) by mouth 4 (four) times daily as needed for flatulence.   traZODone  50 MG tablet Commonly known as: DESYREL  Take 1 tablet (50 mg total) by mouth at bedtime.               Durable [name] Equipment  (From admission, onward)           Start     Ordered   08/22/24 1035  For home use only DME Tub bench  Once        08/22/24 1035   08/21/24 1502  For home use only DME lightweight manual wheelchair with seat cushion  Once       Comments: Patient suffers from weakness which impairs their ability to perform daily activities like bathing, dressing, and grooming in the home.  A walker will not resolve  issue with performing activities of  daily living. A wheelchair will allow patient to safely perform daily activities. Patient is not able to propel themselves in the home using a standard weight wheelchair due to general weakness. Patient can self propel in the lightweight wheelchair. Length of need 6 months . Accessories: elevating leg rests (ELRs), wheel locks, extensions and anti-tippers.   08/21/24 1501   08/21/24 1502  For home use only DME Bedside commode  Once       Question:  Patient needs a bedside commode to treat with the following condition  Answer:  Weakness   08/21/24 1501   08/21/24 1501  For home use only DME Hospital bed  Once  Question Answer Comment  Length of Need 6 Months   The above [name] condition requires: Patient requires the ability to reposition frequently   Head must be elevated greater than: 30 degrees   Bed type Semi-electric      08/21/24 1501            Contact information for follow-up providers     [facility] Go on 08/29/2024.   Why: Schedule is Monday, Wednesday, Friday.  Please arrive at 11:45 am for 12:05 pm start time. Contact information: [address] [name] [address] [phone]         [facility], [name] Follow up in 1 week(s).   Contact information: [address] [name] [address] [phone]              Contact information for after-discharge care     Home [name]     [facility]) .   Service: Home Health Services Contact information: [address] [place]   [address] [phone]                    Allergies[1]  Consultations: Palliative care, nephrology, neurology, neurosurgery   Procedures/Studies: DG CHEST PORT 1 VIEW Result Date: 08/21/2024 EXAM: 1 VIEW(S) XRAY OF THE CHEST 08/21/2024 06:13:40 AM COMPARISON: 07/19/2024 CLINICAL HISTORY: Volume excess FINDINGS: VP shunt catheter projects over the right  side of the chest. Multiple wires and leads project over the chest on the frontal radiograph. Percutaneous gastrostomy tube in upper abdomen. Moderate cardiomegaly. Mild pulmonary venous congestion. Right breast calcification of 1.5 cm is unchanged. IMPRESSION: 1. Moderate cardiomegaly with mild pulmonary venous congestion. Electronically signed by: Rockey Kilts MD 08/21/2024 04:43 PM EST RP Workstation: [id number]   US  Abdomen Limited RUQ (LIVER/GB) Result Date: 08/20/2024 CLINICAL DATA:  Abdominal pain. EXAM: ULTRASOUND ABDOMEN LIMITED RIGHT UPPER QUADRANT COMPARISON:  September 26, 2023 FINDINGS: Gallbladder: A cluster of tiny gallstones is seen within the dependent portion of the gallbladder lumen. There is no evidence of gallbladder wall thickening (2.7 mm). No sonographic Murphy sign noted by sonographer. Common bile duct: Diameter: 2.1 mm Liver: No focal lesion identified. Within normal limits in parenchymal echogenicity. Portal vein is patent on color Doppler imaging with normal direction of blood flow towards the liver. Other: None. IMPRESSION: Cholelithiasis without evidence of acute cholecystitis. Electronically Signed   By: Suzen Dials M.D.   On: 08/20/2024 01:04   DG Abd 1 View Result Date: 08/06/2024 EXAM: 1 VIEW XRAY OF THE ABDOMEN 08/06/2024 06:25:24 AM COMPARISON: 08/04/2024 CLINICAL HISTORY: Abdominal pain FINDINGS: LINES, TUBES AND DEVICES: Percutaneous gastrojejunostomy tube stable in position. VP shunt catheter stable in position. BOWEL: Nonobstructive bowel gas pattern. SOFT TISSUES: Surgical suture in left lower quadrant. Vascular calcifications. No opaque urinary calculi. BONES: No acute osseous abnormality. IMPRESSION: 1. No acute abdominal abnormality identified to explain pain. Electronically signed by: Waddell Calk MD 08/06/2024 06:32 AM EST RP Workstation: [id number]   DG Abd 1 View Result Date: 08/04/2024 CLINICAL DATA:  Abdominal pain EXAM: ABDOMEN - 1 VIEW COMPARISON:   None abdominal x-ray 04/01/2024 FINDINGS: Right-sided VP shunt catheter is present overlying the abdomen. Likely gastrojejunostomy tube present. No dilated bowel loops are seen. There are no suspicious calcifications. There are prominent vascular calcifications in the pelvis. Visualized lung bases are clear. IMPRESSION: Nonobstructive bowel gas pattern. Electronically Signed   By: Greig Pique M.D.   On: 08/04/2024 20:51  Discharge Exam: Vitals:   08/27/24 1105 08/27/24 1202  BP: (!) 145/57 (!) 150/66  Pulse: 83 85  Resp: 20 20  Temp: 98 F (36.7 C) 98.7 F (37.1 C)  SpO2: 96% 100%   Vitals:   08/27/24 1037 08/27/24 1104 08/27/24 1105 08/27/24 1202  BP: 137/61 (!) 145/64 (!) 145/57 (!) 150/66  Pulse: 82 80 83 85  Resp: 17 19 20 20   Temp:   98 F (36.7 C) 98.7 F (37.1 C)  TempSrc:    Oral  SpO2: 95% 98% 96% 100%  Weight:      Height:        General: Pt is alert, awake, not in acute distress Cardiovascular: RRR, S1/S2 +, no rubs, no gallops Respiratory: CTA bilaterally, no wheezing, no rhonchi Abdominal: Soft, NT, ND, bowel sounds + Extremities: no edema, no cyanosis    The results of significant diagnostics from this hospitalization (including imaging, microbiology, ancillary and laboratory) are listed below for reference.     Microbiology: No results found for this or any previous visit (from the past 240 hours).   Labs: BNP (last 3 results) Recent Labs    02/14/24 0949  BNP 1,486.6*   Basic Metabolic Panel: Recent Labs  Lab 08/21/24 0248 08/22/24 0930 08/23/24 0511 08/25/24 1330  NA 136 135 136 136  K 5.2* 4.2 4.7 4.9  CL 95* 94* 94* 94*  CO2 27 27 29 27   GLUCOSE 73 147* 103* 150*  BUN 47* 27* 37* 43*  CREATININE 4.89* 3.64* 4.93* 5.47*  CALCIUM  9.5 10.4* 9.8 10.4*  PHOS  --   --   --  5.6*   Liver Function Tests: Recent Labs  Lab 08/25/24 1330  ALBUMIN  3.8   No results for input(s): LIPASE, AMYLASE in the last 168 hours.  No  results for input(s): AMMONIA in the last 168 hours. CBC: Recent Labs  Lab 08/23/24 0511 08/25/24 1300  WBC 2.9* 3.9*  HGB 9.0* 9.8*  HCT 28.9* 31.5*  MCV 102.1* 101.9*  PLT 129* 137*   Cardiac Enzymes: No results for input(s): CKTOTAL, CKMB, CKMBINDEX, TROPONINI in the last 168 hours. BNP: Invalid input(s): POCBNP CBG: No results for input(s): GLUCAP in the last 168 hours. D-Dimer No results for input(s): DDIMER in the last 72 hours. Hgb A1c No results for input(s): HGBA1C in the last 72 hours. Lipid Profile No results for input(s): CHOL, HDL, LDLCALC, TRIG, CHOLHDL, LDLDIRECT in the last 72 hours. Thyroid function studies No results for input(s): TSH, T4TOTAL, T3FREE, THYROIDAB in the last 72 hours.  Invalid input(s): FREET3 Anemia work up No results for input(s): VITAMINB12, FOLATE, FERRITIN, TIBC, IRON , RETICCTPCT in the last 72 hours. Urinalysis    Component Value Date/Time   COLORURINE RED (A) 11/07/2021 1239   APPEARANCEUR TURBID (A) 11/07/2021 1239   LABSPEC  11/07/2021 1239    TEST NOT REPORTED DUE TO COLOR INTERFERENCE OF URINE PIGMENT   PHURINE  11/07/2021 1239    TEST NOT REPORTED DUE TO COLOR INTERFERENCE OF URINE PIGMENT   GLUCOSEU (A) 11/07/2021 1239    TEST NOT REPORTED DUE TO COLOR INTERFERENCE OF URINE PIGMENT   HGBUR (A) 11/07/2021 1239    TEST NOT REPORTED DUE TO COLOR INTERFERENCE OF URINE PIGMENT   HGBUR large 12/03/2007 1409   BILIRUBINUR (A) 11/07/2021 1239    TEST NOT REPORTED DUE TO COLOR INTERFERENCE OF URINE PIGMENT   KETONESUR (A) 11/07/2021 1239    TEST NOT REPORTED DUE TO COLOR INTERFERENCE OF URINE PIGMENT  PROTEINUR (A) 11/07/2021 1239    TEST NOT REPORTED DUE TO COLOR INTERFERENCE OF URINE PIGMENT   UROBILINOGEN 0.2 01/13/2015 0124   NITRITE (A) 11/07/2021 1239    TEST NOT REPORTED DUE TO COLOR INTERFERENCE OF URINE PIGMENT   LEUKOCYTESUR (A) 11/07/2021 1239    TEST NOT  REPORTED DUE TO COLOR INTERFERENCE OF URINE PIGMENT   Sepsis Labs Recent Labs  Lab 08/23/24 0511 08/25/24 1300  WBC 2.9* 3.9*   Microbiology No results found for this or any previous visit (from the past 240 hours).  FURTHER DISCHARGE INSTRUCTIONS:   Get Medicines reviewed and adjusted: Please take all your medications with you for your next visit with your Primary MD   Laboratory/radiological data: Please request your Primary MD to go over all hospital tests and procedure/radiological results at the follow up, please ask your Primary MD to get all Hospital records sent to his/her office.   In some cases, they will be blood work, cultures and biopsy results pending at the time of your discharge. Please request that your primary care M.D. goes through all the records of your hospital data and follows up on these results.   Also Note the following: If you experience worsening of your admission symptoms, develop shortness of breath, life threatening emergency, suicidal or homicidal thoughts you must seek [name] attention immediately by calling 911 or calling your MD immediately  if symptoms less severe.   You must read complete instructions/literature along with all the possible adverse reactions/side effects for all the Medicines you take and that have been prescribed to you. Take any new Medicines after you have completely understood and accpet all the possible adverse reactions/side effects.    patient was instructed, not to drive, operate heavy machinery, perform activities at heights, swimming or participation in water  activities or provide baby-sitting services while on Pain, Sleep and Anxiety Medications; until their outpatient Physician has advised to do so again. Also recommended to not to take more than prescribed Pain, Sleep and Anxiety Medications.  It is not advisable to combine anxiety, sleep and pain medications without talking with your primary care provider.     Wear Seat  belts while driving.   Please note: You were cared for by a hospitalist during your hospital stay. Once you are discharged, your primary care physician will handle any further [name] issues. Please note that NO REFILLS for any discharge medications will be authorized once you are discharged, as it is imperative that you return to your primary care physician (or establish a relationship with a primary care physician if you do not have one) for your post hospital discharge needs so that they can reassess your need for medications and monitor your lab values  Time coordinating discharge: Over 30 minutes  SIGNED:   Fredia Skeeter, MD  [name] 08/27/2024, 3:39 PM *Please note that this is a verbal dictation therefore any spelling or grammatical errors are due to the Dragon [name] One system interpretation. If 7PM-7AM, please contact night-coverage [email or web address]     [1] No Known Allergies

## 2024-08-26 NOTE — Progress Notes (Signed)
 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  Patient was noted to have hemorrhagic stroke.  Neurology, neurosurgery in critical care were consulted.  EVD was placed on the left side and was initially admitted to neuro ICU.  She was also intubated and had prolonged intubation.  Subsequently, patient was extubated on 05/27/2024.   Important events:  9/15 admitted with ICH plus IVH, EVD was placed for hydrocephalus, intubated 9/22 palliative care meeting.  Family is insistent that they want full scope of care 9/23 Extubated 10/1 -developed fevers pancultures drawn and broad-spectrum antibiotics started 10/2 became afebrile after starting antibiotics, did not tolerate raising EVD 10/6 EVD was raised from 10 to 30 cm water , tolerating well, remained afebrile 10/7 EVD is at 30 cm water , CT scan will be done tomorrow morning, no overnight issues 10/8 she became somnolent, CT head showed increasing hydrocephalus, EVD was titrated down to 10 cm of water  with improvement in mental status 10/15 VP shunt placement 10/17 transferred to progressive unit with [name]  11/18-medically stable.  Awaiting for disposition.  Insurance declined LTAC and skilled nursing facility, appeal pending. 12/3-tube feeding changed to nocturnal tube feeds, started on p.o. diet 12/9 nocturnal tube feeds d/c'd  Assessment & Plan:   Principal Problem:   Hemorrhagic stroke [facility]) Active Problems:   Moderate protein-calorie malnutrition   Pressure injury of skin   Nontraumatic subcortical hemorrhage of right cerebral hemisphere  [facility])  Acute right parietotemporal intraparenchymal hemorrhage / Intraventricular hemorrhage: Uncontrolled hypertension: Obstructive hydrocephalus s/p EVD changed to VP shunt: Seizure disorder S/p VP shunt placement on 06/18/2024 by Dr. Mavis.  Continue Vimpat .   Neurosurgery intermittently followed the patient during hospitalization.     ESRD on dialysis: History of failed renal transplant: Secondary hyperparathyroidism: Hyperkalemia  Nephrology on board.  Continue CellCept  and prednisone .   On Monday, Wednesday, Friday hemodialysis schedule.   Can resume outpatient HD center at discharge [facility] on MWF )   Depressed Mood C/o feeling sad some days when she returns from dialysis.  Continues to denie SI.  Offered antidepressant, she declines, was asking for something IV, not indicated at this time.     Headache resolved  Right Upper Quadrant Pain RUQ US  with cholelithiasis  resolved   Hyponatremia improved   History of pancreatic/kidney transplantation:  Continue cyclosporine , mycophenolate , and prednisone  (will continue 15 mg dose based on discussion with renal)   Hypertension:  -Continue Coreg  25 mg p.o. twice daily -amlodipine  10 mg  - hydralazine  (increase to 100 q8)  - losartan  d/c'd with hyperkalemia Blood pressure fairly controlled.  Will monitor 1-2 more days on current regimen.  Dizziness -resolved    Anemia of chronic disease:  Stable Transfuse if Hb below 7.0   Type 1 diabetes with vasculopathy/gastroparesis:  Not on long-term insulin  anymore due to pancreatic transplant.  A1c 3.9 Follow BG intermittently on BMP    Moderate protein calorie malnutrition/dysphagia:  S/p PEG She's taking PO well, tube feeds d/c'd   Insomnia Trial trazodone    Debility/deconditioning/disposition/goals of care:  Palliative care had seen the patient during hospitalization.  Palliative care has signed off 10/17.  Disposition pending.  TOC on  board.     Continuing  to work on blood pressure.  RUQ pain has improved.  DVT prophylaxis: SCDs Start: 05/19/24 1724   Code Status: Full Code  Family Communication: Mother present at bedside.  Plan of care discussed with patient in length and he/she verbalized understanding and agreed with it.  Status is: Inpatient Remains inpatient appropriate because: Medically stable, pending placement.   Estimated body mass index is 21.79 kg/m as calculated from the following:   Height as of this encounter: 5' 3 (1.6 m).   Weight as of this encounter: 55.8 kg.    Nutritional Assessment: Body mass index is 21.79 kg/m.[name] Seen by dietician.  I agree with the assessment and plan as outlined below: Nutrition Status: Nutrition Problem: Moderate Malnutrition Etiology: chronic illness (ESRD on HD/DM and gastroparesis) Signs/Symptoms: severe muscle depletion, mild fat depletion Interventions: Tube feeding, Prostat, MVI  . Skin Assessment: I have examined the patient's skin and I agree with the wound assessment as performed by the wound care RN as outlined below:    Consultants:  Nephrology  Procedures:  As above  Antimicrobials:  Anti-infectives (From admission, onward)    Start     Dose/Rate Route Frequency Ordered Stop   07/08/24 1530  fluconazole  (DIFLUCAN ) tablet 150 mg        150 mg Oral  Once 07/08/24 1437 07/08/24 1544   06/18/24 2030  ceFAZolin  (ANCEF ) IVPB 2g/100 mL premix  Status:  Discontinued        2 g 200 mL/hr over 30 Minutes Intravenous Every 8 hours 06/18/24 1454 06/19/24 1608   06/18/24 0600  ceFAZolin  (ANCEF ) IVPB 2g/100 mL premix        2 g 200 mL/hr over 30 Minutes Intravenous On call to O.R. 06/15/24 1152 06/18/24 1230   06/06/24 1400  piperacillin -tazobactam (ZOSYN ) IVPB 2.25 g        2.25 g 100 mL/hr over 30 Minutes Intravenous Every 8 hours 06/06/24 1017 06/09/24 0538   06/06/24 1200  vancomycin  (VANCOREADY) IVPB 500 mg/100 mL        500 mg 100 mL/hr over 60 Minutes Intravenous  Every M-W-F (Hemodialysis) 06/04/24 1745 06/06/24 1900   06/04/24 1845  vancomycin  (VANCOCIN ) IVPB 1000 mg/200 mL premix        1,000 mg 200 mL/hr over 60 Minutes Intravenous  Once 06/04/24 1745 06/04/24 1915   06/04/24 1830  ceFEPIme  (MAXIPIME ) 2 g in sodium chloride  0.9 % 100 mL IVPB  Status:  Discontinued        2 g 200 mL/hr over 30 Minutes Intravenous Every M-W-F (Hemodialysis) 06/04/24 1743 06/06/24 1017   05/27/24 1015  cefTRIAXone  (ROCEPHIN ) 2 g in sodium chloride  0.9 % 100 mL IVPB        2 g 200 mL/hr over 30 Minutes Intravenous Every 24 hours 05/27/24 0918 05/29/24 1014   05/23/24 1530  ceFEPIme  (MAXIPIME ) 1 g in sodium chloride  0.9 % 100 mL IVPB  Status:  Discontinued        1 g 200 mL/hr over 30 Minutes Intravenous Every 24 hours 05/23/24 1433 05/27/24 0918   05/22/24 2200  Ampicillin -Sulbactam (UNASYN ) 3 g in sodium chloride  0.9 % 100 mL IVPB  Status:  Discontinued        3 g 200 mL/hr over 30 Minutes Intravenous Every 24 hours 05/22/24 0834 05/23/24 1433   05/22/24 0930  Ampicillin -Sulbactam (UNASYN ) 3 g in sodium chloride  0.9 % 100 mL IVPB        3 g 200  mL/hr over 30 Minutes Intravenous  Once 05/22/24 9165 05/22/24 1900         Subjective: Patient seen and examined, mother at the bedside.  No complaints today.  Objective: Vitals:   08/25/24 1731 08/25/24 2152 08/26/24 0528 08/26/24 0725  BP: (!) 144/59 (!) 163/76 (!) 173/70 (!) 175/65  Pulse: 88 80 75 76  Resp: 19 18 16 16   Temp:  98 F (36.7 C) 98.2 F (36.8 C) 98.5 F (36.9 C)  TempSrc:  Oral Oral Oral  SpO2: 98% 100% 96% 98%  Weight:      Height:        Intake/Output Summary (Last 24 hours) at 08/26/2024 1127 Last data filed at 08/25/2024 1726 Gross per 24 hour  Intake --  Output 2000 ml  Net -2000 ml   Filed Weights   08/23/24 1200 08/25/24 1341 08/25/24 1726  Weight: 56.7 kg 57.8 kg 55.8 kg    Examination:  General exam: Appears calm and comfortable  Respiratory system: Clear to  auscultation. Respiratory effort normal. Cardiovascular system: S1 & S2 heard, RRR. No JVD, murmurs, rubs, gallops or clicks. No pedal edema. Gastrointestinal system: Abdomen is nondistended, soft and nontender. No organomegaly or masses felt. Normal bowel sounds heard. Central nervous system: Alert and oriented. No focal neurological deficits. Extremities: Symmetric 5 x 5 power. Skin: No rashes, lesions or ulcers Psychiatry: Judgement and insight appear normal. Mood & affect appropriate.    Data Reviewed: I have personally reviewed following labs and imaging studies  CBC: Recent Labs  Lab 08/23/24 0511 08/25/24 1300  WBC 2.9* 3.9*  HGB 9.0* 9.8*  HCT 28.9* 31.5*  MCV 102.1* 101.9*  PLT 129* 137*   Basic Metabolic Panel: Recent Labs  Lab 08/20/24 0217 08/21/24 0248 08/22/24 0930 08/23/24 0511 08/25/24 1330  NA 138 136 135 136 136  K 4.2 5.2* 4.2 4.7 4.9  CL 96* 95* 94* 94* 94*  CO2 29 27 27 29 27   GLUCOSE 82 73 147* 103* 150*  BUN 34* 47* 27* 37* 43*  CREATININE 3.36* 4.89* 3.64* 4.93* 5.47*  CALCIUM  9.6 9.5 10.4* 9.8 10.4*  PHOS  --   --   --   --  5.6*   GFR: Estimated Creatinine Clearance: 11.1 mL/min (A) (by C-G formula based on SCr of 5.47 mg/dL (H)). Liver Function Tests: Recent Labs  Lab 08/19/24 1952 08/25/24 1330  AST 35  --   ALT 40  --   ALKPHOS 96  --   BILITOT 0.4  --   PROT 6.0*  --   ALBUMIN  3.6 3.8   Recent Labs  Lab 08/19/24 1952  LIPASE 34   No results for input(s): AMMONIA in the last 168 hours. Coagulation Profile: No results for input(s): INR, PROTIME in the last 168 hours. Cardiac Enzymes: No results for input(s): CKTOTAL, CKMB, CKMBINDEX, TROPONINI in the last 168 hours. BNP (last 3 results) No results for input(s): PROBNP in the last 8760 hours. HbA1C: No results for input(s): HGBA1C in the last 72 hours. CBG: No results for input(s): GLUCAP in the last 168 hours. Lipid Profile: No results for input(s):  CHOL, HDL, LDLCALC, TRIG, CHOLHDL, LDLDIRECT in the last 72 hours. Thyroid Function Tests: No results for input(s): TSH, T4TOTAL, FREET4, T3FREE, THYROIDAB in the last 72 hours. Anemia Panel: No results for input(s): VITAMINB12, FOLATE, FERRITIN, TIBC, IRON , RETICCTPCT in the last 72 hours. Sepsis Labs: No results for input(s): PROCALCITON, LATICACIDVEN in the last 168 hours.  No results found for  this or any previous visit (from the past 240 hours).   Radiology Studies: No results found.  Scheduled Meds:  amLODipine   10 mg Oral Daily   carvedilol   25 mg Oral BID WC   Chlorhexidine  Gluconate Cloth  6 each Topical Q0600   cinacalcet   30 mg Oral Q M,W,F   cycloSPORINE  modified  200 mg Oral Daily   And   cycloSPORINE  modified  25 mg Oral Daily   cycloSPORINE  modified  200 mg Oral QPM   darbepoetin (ARANESP ) injection - DIALYSIS  200 mcg Subcutaneous Q Thu-1800   diclofenac  Sodium  2 g Topical QID   famotidine   10 mg Oral QHS   gabapentin   100 mg Oral Daily   hydrALAZINE   100 mg Oral Q8H    HYDROmorphone  (DILAUDID ) injection  0.25 mg Intravenous Once   lacosamide   100 mg Oral BID   multivitamin  1 tablet Oral QHS   mycophenolate   500 mg Oral BID   pantoprazole   40 mg Oral Daily   predniSONE   15 mg Oral Q breakfast   senna-docusate  1 tablet Oral QHS   sevelamer  carbonate  1,600 mg Oral TID WC   sodium zirconium cyclosilicate   10 g Oral Daily   traZODone   50 mg Oral QHS   Continuous Infusions:     LOS: 99 days   Fredia Skeeter, MD [name]  08/26/2024, 11:27 AM   *Please note that this is a verbal dictation therefore any spelling or grammatical errors are due to the Dragon Medical One system interpretation.  Please page via Amion and do not message via secure chat for urgent patient care matters. Secure chat can be used for non urgent patient care matters.  How to contact the [facility] Attending or Consulting provider 7A - 7P  or covering provider during after hours 7P -7A, for this patient?  Check the care team in [facility] and look for a) attending/consulting TRH provider listed and b) the TR[facility] team listed. Page or secure chat 7A-7P. Log into [email or web address] and use 's universal password to access. If you do not have the password, please contact the hospital operator. Locate the [facility] provider you are looking for under [name] and page to a number that you can be directly reached. If you still have difficulty reaching the provider, please page the [facility] (Director on Call) for the Hospitalists listed on amion for assistance.  "

## 2024-08-26 NOTE — TOC Progression Note (Addendum)
 Transition of Care [facility]) - Progression Note    Patient Details  Name: [name] [name] MRN: [id number] Date of Birth: 22-Oct-1980  Transition of Care [facility]) CM/SW Contact  [name] [name], [name] Phone Number: 08/26/2024, 4:04 PM  Clinical Narrative:   CSW coordinated with Renal Navigator for outpatient HD plans, patient can remain on MWF schedule for the holiday. First outpatient HD can begin on Friday. CSW met with patient and mother to explain, they are in agreement. CSW contacted [employer] to ask about DME delivery, DME to be delivered tomorrow between 2 and 4. Mother asked about medications and follow-up appointments, and CSW explained that meds could be filled through [facility] and follow up appointments would be placed on patient's AVS. Mother very appreciative of CSW assistance. CSW updated MD. CSW contacted [employer] to cancel patient's transportation for tomorrow. CSW to follow.    Expected Discharge Plan: Home w Home Health Services Barriers to Discharge: Equipment Delay               Expected Discharge Plan and Services In-house Referral: Clinical Social Work   Post Acute Care Choice: Skilled Nursing Facility Living arrangements for the past 2 months: Apartment                                       Social Drivers of Health (SDOH) Interventions SDOH Screenings   Food Insecurity: Patient Unable To Answer (05/20/2024)  Housing: Unknown (07/14/2024)  Transportation Needs: Patient Unable To Answer (05/20/2024)  Utilities: Patient Unable To Answer (05/20/2024)  Social Connections: Unknown (10/04/2022)   Received from [facility]  Stress: No Stress Concern Present (04/16/2024)   Received from [facility]  Tobacco Use: Medium Risk (06/18/2024)    Readmission Risk Interventions    05/20/2024    4:16 PM 03/31/2024   11:27 AM  Readmission Risk Prevention Plan  Transportation Screening Complete Complete  Medication Review Oceanographer) Complete Complete   PCP or Specialist appointment within 3-5 days of discharge Complete   HRI or Home Care Consult Complete Complete  SW Recovery Care/Counseling Consult Complete Complete  Palliative Care Screening Complete Not Applicable  Skilled Nursing Facility Complete Not Applicable

## 2024-08-26 NOTE — TOC Progression Note (Signed)
 Transition of Care [facility]) - Progression Note    Patient Details  Name: [name] [name] MRN: [id number] Date of Birth: 1980-11-26  Transition of Care [facility]) CM/SW Contact  [name], [name] Phone Number: 08/26/2024, 11:55 AM  Clinical Narrative:   CSW contacted [employer] [phone]) to attempt transportation again. CSW spoke with representative about scheduling transportation, and discussed patient's needs. Per representative, transportation can only be scheduled out 72 hours; would need special approval to schedule within 72 hours. Representative placed CSW on hold and then lost the call. CSW called back, spoke with another representative about patient's transportation needs. CSW got patient scheduled for transportation both to and from HD on 12/24, 12/26, 12/29, and 12/31. Representative was unable to schedule for January calendar yet, patient will need to call back after 12/29 to schedule into the new year.   CSW compiled information on transportation and presented to patient's mother, answered questions. Patient still has not received DME that was ordered. CSW contacted [employer] to ask about DME delivery, they will deliver tomorrow.  CSW contacted [facility] to discuss referral for [facility], they were able to accept. Information placed on AVS.   CSW updated renal navigator about transportation arranged, and patient's outpatient HD would be on a holiday schedule so days would be different. CSW may need to adjust transportation, renal navigator to see if patient's HD clinic can accommodate routine days. CSW to follow.    Expected Discharge Plan: Home w Home Health Services Barriers to Discharge: Equipment Delay, Transportation               Expected Discharge Plan and Services In-house Referral: Clinical Social Work   Post Acute Care Choice: Skilled Nursing Facility Living arrangements for the past 2 months: Apartment                                       Social Drivers  of Health (SDOH) Interventions SDOH Screenings   Food Insecurity: Patient Unable To Answer (05/20/2024)  Housing: Unknown (07/14/2024)  Transportation Needs: Patient Unable To Answer (05/20/2024)  Utilities: Patient Unable To Answer (05/20/2024)  Social Connections: Unknown (10/04/2022)   Received from [facility]  Stress: No Stress Concern Present (04/16/2024)   Received from [facility]  Tobacco Use: Medium Risk (06/18/2024)    Readmission Risk Interventions    05/20/2024    4:16 PM 03/31/2024   11:27 AM  Readmission Risk Prevention Plan  Transportation Screening Complete Complete  Medication Review Oceanographer) Complete Complete  PCP or Specialist appointment within 3-5 days of discharge Complete   HRI or Home Care Consult Complete Complete  SW Recovery Care/Counseling Consult Complete Complete  Palliative Care Screening Complete Not Applicable  Skilled Nursing Facility Complete Not Applicable

## 2024-08-26 NOTE — Progress Notes (Addendum)
 Renal PA reached out to FA at [facility] this morning to inquire if clinic would be able to treat pt on MWF next week in spite of clinic running on holiday schedule due to pt's transportation arrangements. FA was to provide update to PA however, PA has not received an update as of yet. Navigator has reached out to FA for update so update can provided to hospital staff and plans can be made accordingly. Will await response from out-pt HD clinic/FA. Will assist as needed.   Randine Mungo Dialysis Navigator [phone]  Addendum at 1:15 pm: Spoke to FA at [facility] [place]. Pt can resume regular schedule on Friday and clinic can accommodate pt on MWF next week due to transportation even though clinic running on holiday schedule. Clinic advised pt should d/c tomorrow and should resume care on Friday and MWF next week. HD info added to pt's AVS as well. Pt's chair time will be 12:05 pm. Nephrologist, renal PA, CSW, pt's RN, and HD RN aware of this info.

## 2024-08-26 NOTE — Plan of Care (Signed)
" °  Problem: Education: Goal: Knowledge of disease or condition will improve Outcome: Progressing Goal: Knowledge of secondary prevention will improve (MUST DOCUMENT ALL) Outcome: Progressing Goal: Knowledge of patient specific risk factors will improve (DELETE if not current risk factor) Outcome: Progressing   Problem: Self-Care: Goal: Ability to participate in self-care as condition permits will improve Outcome: Progressing   "

## 2024-08-27 ENCOUNTER — Other Ambulatory Visit (HOSPITAL_COMMUNITY): Payer: Self-pay

## 2024-08-27 ENCOUNTER — Encounter (HOSPITAL_COMMUNITY): Payer: Self-pay

## 2024-08-27 DIAGNOSIS — I619 Nontraumatic intracerebral hemorrhage, unspecified: Secondary | ICD-10-CM | POA: Diagnosis not present

## 2024-08-27 MED ORDER — PENTAFLUOROPROP-TETRAFLUOROETH EX AERO
INHALATION_SPRAY | CUTANEOUS | Status: AC
  Filled 2024-08-27: qty 30

## 2024-08-27 MED ORDER — CINACALCET HCL 30 MG PO TABS: 90.0000 mg | ORAL_TABLET | Freq: Every day | ORAL | 0 refills | Status: AC

## 2024-08-27 MED ORDER — CYCLOSPORINE MODIFIED (NEORAL) 100 MG PO CAPS: 200.0000 mg | ORAL_CAPSULE | Freq: Two times a day (BID) | ORAL | 0 refills | Status: AC

## 2024-08-27 MED ORDER — CINACALCET HCL 30 MG PO TABS
90.0000 mg | ORAL_TABLET | Freq: Every day | ORAL | 0 refills | Status: DC
  Filled 2024-08-27: qty 60, 20d supply, fill #0

## 2024-08-27 MED ORDER — SEVELAMER CARBONATE 2.4 G PO PACK
2.4000 g | PACK | Freq: Three times a day (TID) | ORAL | 0 refills | Status: DC
  Filled 2024-08-27: qty 90, 30d supply, fill #0

## 2024-08-27 MED ORDER — CYCLOSPORINE MODIFIED (NEORAL) 100 MG PO CAPS
200.0000 mg | ORAL_CAPSULE | Freq: Two times a day (BID) | ORAL | 0 refills | Status: DC
  Filled 2024-08-27: qty 120, 30d supply, fill #0

## 2024-08-27 MED ORDER — MELATONIN 3 MG PO TABS
3.0000 mg | ORAL_TABLET | Freq: Every evening | ORAL | 0 refills | Status: AC | PRN
  Filled 2024-08-27: qty 30, 30d supply, fill #0

## 2024-08-27 MED ORDER — SEVELAMER CARBONATE 2.4 G PO PACK: 2.4000 g | PACK | Freq: Three times a day (TID) | ORAL | 0 refills | Status: AC

## 2024-08-27 MED ORDER — ONDANSETRON HCL 4 MG PO TABS
4.0000 mg | ORAL_TABLET | Freq: Three times a day (TID) | ORAL | 0 refills | Status: AC | PRN
  Filled 2024-08-27: qty 20, 7d supply, fill #0

## 2024-08-27 MED ORDER — SIMETHICONE 40 MG/0.6ML PO SUSP
40.0000 mg | Freq: Four times a day (QID) | ORAL | 0 refills | Status: AC | PRN
  Filled 2024-08-27: qty 30, 13d supply, fill #0

## 2024-08-27 NOTE — Plan of Care (Signed)
" °  Problem: Education: Goal: Knowledge of disease or condition will improve Outcome: Adequate for Discharge Goal: Knowledge of secondary prevention will improve (MUST DOCUMENT ALL) Outcome: Adequate for Discharge Goal: Knowledge of patient specific risk factors will improve (DELETE if not current risk factor) Outcome: Adequate for Discharge   Problem: Self-Care: Goal: Ability to participate in self-care as condition permits will improve Outcome: Adequate for Discharge   Problem: Education: Goal: Knowledge of General Education information will improve Description: Including pain rating scale, medication(s)/side effects and non-pharmacologic comfort measures Outcome: Adequate for Discharge   Problem: Health Behavior/Discharge Planning: Goal: Ability to manage health-related needs will improve Outcome: Adequate for Discharge   Problem: Clinical Measurements: Goal: Ability to maintain clinical measurements within normal limits will improve Outcome: Adequate for Discharge Goal: Will remain free from infection Outcome: Adequate for Discharge Goal: Diagnostic test results will improve Outcome: Adequate for Discharge Goal: Respiratory complications will improve Outcome: Adequate for Discharge Goal: Cardiovascular complication will be avoided Outcome: Adequate for Discharge   Problem: Activity: Goal: Risk for activity intolerance will decrease Outcome: Adequate for Discharge   Problem: Nutrition: Goal: Adequate nutrition will be maintained Outcome: Adequate for Discharge   Problem: Safety: Goal: Ability to remain free from injury will improve Outcome: Adequate for Discharge   Problem: Skin Integrity: Goal: Risk for impaired skin integrity will decrease Outcome: Adequate for Discharge   "

## 2024-08-27 NOTE — Progress Notes (Signed)
 Patient and mother have been provided discharge instructions to include medications, home health assistance, and follow up appointment. They both verbalize understanding of instructions.

## 2024-08-27 NOTE — TOC Transition Note (Signed)
 Transition of Care [facility]) - Discharge Note   Patient Details  Name: [name] [name] MRN: [id number] Date of Birth: [birth year]  Transition of Care [facility]) CM/SW Contact:  [name], RN Phone Number: 08/27/2024, 3:28 PM   Clinical Narrative:     Pt is discharging home with home health through [facility]. Information on the AVS.  LCSW arranged transportation for her HD through her insurance.  [employer] delivered DME to her home today.  [name] has arranged transportation home.  Final next level of care: Home w Home Health Services Barriers to Discharge: No Barriers Identified   Patient Goals and CMS Choice   CMS Medicare.gov Compare Post Acute Care list provided to:: Patient Choice offered to / list presented to : Patient      Discharge Placement                       Discharge Plan and Services Additional resources added to the After Visit Summary for   In-house Referral: Clinical Social Work   Post Acute Care Choice: Skilled Nursing Facility          DME Arranged: Bedside commode, Hospital bed, Tub bench, Community education officer wheelchair with seat cushion DME Agency: [facility] spoke with at DME Agency: [place] HH Arranged: PT, OT HH Agency: [facility] Date [facility] Agency Contacted: 08/27/24   Representative spoke with at [facility] Agency: [place]  Social Drivers of Health (SDOH) Interventions SDOH Screenings   Food Insecurity: Patient Unable To Answer (05/20/2024)  Housing: Unknown (07/14/2024)  Transportation Needs: Patient Unable To Answer (05/20/2024)  Utilities: Patient Unable To Answer (05/20/2024)  Social Connections: Unknown (10/04/2022)   Received from [facility]  Stress: No Stress Concern Present (04/16/2024)   Received from [facility]  Tobacco Use: Medium Risk (06/18/2024)     Readmission Risk Interventions    05/20/2024    4:16 PM 03/31/2024   11:27 AM  Readmission Risk Prevention Plan   Transportation Screening Complete Complete  Medication Review Oceanographer) Complete Complete  PCP or Specialist appointment within 3-5 days of discharge Complete   HRI or Home Care Consult Complete Complete  SW Recovery Care/Counseling Consult Complete Complete  Palliative Care Screening Complete Not Applicable  Skilled Nursing Facility Complete Not Applicable

## 2024-08-27 NOTE — Progress Notes (Signed)
 D/C order noted. Contacted [facility] to confirm pt will d/c today and should resume care on Friday as planned. Renal PA to send orders to clinic.   Randine Mungo Dialysis Navigator [phone]

## 2024-08-27 NOTE — Progress Notes (Signed)
 " [facility] Progress Note   Subjective:   Seen on HD - no CP/dyspnea. Some mild RUQ pain today - unclear. Plan is for discharge later today.  Objective Vitals:   08/27/24 0816 08/27/24 0823 08/27/24 0830 08/27/24 0900  BP: (!) 160/64 (!) 184/74  (!) 119/55  Pulse: 76 82 81 81  Resp: 18 17 20 11   Temp:      TempSrc:      SpO2: 97% 100% 96% 97%  Weight:      Height:       Physical Exam General: Frail, but well appearing, NAD Heart: RRR Lungs: CTAB Abdomen: mild RUQ TTP, soft Extremities: no LE edema Dialysis Access: AVF +t/b  Additional Objective Labs: Basic Metabolic Panel: Recent Labs  Lab 08/22/24 0930 08/23/24 0511 08/25/24 1330  NA 135 136 136  K 4.2 4.7 4.9  CL 94* 94* 94*  CO2 27 29 27   GLUCOSE 147* 103* 150*  BUN 27* 37* 43*  CREATININE 3.64* 4.93* 5.47*  CALCIUM  10.4* 9.8 10.4*  PHOS  --   --  5.6*   Liver Function Tests: Recent Labs  Lab 08/25/24 1330  ALBUMIN  3.8   CBC: Recent Labs  Lab 08/23/24 0511 08/25/24 1300  WBC 2.9* 3.9*  HGB 9.0* 9.8*  HCT 28.9* 31.5*  MCV 102.1* 101.9*  PLT 129* 137*   Medications:   amLODipine   10 mg Oral Daily   carvedilol   25 mg Oral BID WC   Chlorhexidine  Gluconate Cloth  6 each Topical Q0600   cinacalcet   30 mg Oral Q M,W,F   cycloSPORINE  modified  200 mg Oral Daily   And   cycloSPORINE  modified  25 mg Oral Daily   cycloSPORINE  modified  200 mg Oral QPM   darbepoetin (ARANESP ) injection - DIALYSIS  200 mcg Subcutaneous Q Thu-1800   diclofenac  Sodium  2 g Topical QID   famotidine   10 mg Oral QHS   gabapentin   100 mg Oral Daily   hydrALAZINE   100 mg Oral Q8H    HYDROmorphone  (DILAUDID ) injection  0.25 mg Intravenous Once   lacosamide   100 mg Oral BID   multivitamin  1 tablet Oral QHS   mycophenolate   500 mg Oral BID   pantoprazole   40 mg Oral Daily   predniSONE   15 mg Oral Q breakfast   senna-docusate  1 tablet Oral QHS   sevelamer  carbonate  1,600 mg Oral TID WC   sodium  zirconium cyclosilicate  10 g Oral Daily   traZODone   50 mg Oral QHS    Dialysis Orders MWF - NW 3:45hr, 400/A1.5, EDW 55.2kg, 2K/2Ca bath, AVF, no heparin  - Prev on Mircera 150mcg IV q 2 weeks - No VDRA  Assessment/Plan:  Acute R intracranial hemorrhage: 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  Doing a lot better, memory and alertness much improved.  ESRD: HD today, then back to usual MWF schedule as outpatient starting Friday 12/26 Hyperkalemia: Now on Lokelma  daily. HTN/volume: BP better with HD Continue carvedilol  25mg  BID, hydralazine  100mg  TID, and amlodipine  10 mg daily.  Receiving medicine this morning and monitor BP.  Push UF as tolerated. Anemia of ESRD: Hgb 8s > 9s, transfuse prn. Continue Aranesp  200 q Thurs. 12/6 iron  sat 31%.  Secondary HPTH: Ca ok, last Phos high and sevelamer  dose increased. Continue sensipar , not on VDRA (PTH 126). Check phos level.   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 Dispo: Insurance declined LTAC, and SNF. Working to go home. Tolerated  HD in recliner on multiple days. For discharge after HD today.   Izetta Boehringer, PA-C 08/27/2024, 9:26 AM  [facility]    "

## 2024-08-27 NOTE — Discharge Planning (Signed)
 [facility] Patient Discharge Orders - [facility] CLINIC: [place]  Patient's name: [name] [name] Admit/DC Dates: 05/19/2024 - 08/27/2024  DISCHARGE DIAGNOSES: Acute R intracranial hemorrhage s/p VP shunt 06/18/24 Debility - much improved with rehab  Hyperkalemia - now on Lokelma  daily ESRD  HD ORDER CHANGES: 3d/week 3:45hr 180 dialyzer BFR 400 DFR A1.5 EDW 55.5kg 2K/2Ca bath AVF no heparin   ANEMIA MANAGEMENT: Aranesp : Given: yes   Amount/Date of last dose: Aranesp  on 12/18 ESA dose for discharge: Mircera 225 mcg IV q 2 weeks, to start on 12/26 IV Iron  dose at discharge: Per protocol Transfusion: Given: no  BONE/MINERAL MEDICATIONS: Hectorol /Calcitriol  change: no  Sensipar /Parsabiv change: no  ACCESS INTERVENTION/CHANGE: no Details:   RECENT LABS: Recent Labs  Lab 08/25/24 1300 08/25/24 1330  HGB 9.8*  --   NA  --  136  K  --  4.9  CALCIUM   --  10.4*  PHOS  --  5.6*  ALBUMIN   --  3.8    IV ANTIBIOTICS: no Details:  OTHER ANTICOAGULATION: no Details:  OTHER/APPTS/LAB ORDERS: - Pls check monthly labs when returns - Going home with mother  D/C Meds to be reconciled by nurse after every discharge.  Completed By: Izetta Boehringer, PA-C [facility] Pager [phone]   Reviewed by: MD:______ RN_______

## 2024-08-27 NOTE — Progress Notes (Signed)
" °   08/27/24 1105  Vitals  Temp 98 F (36.7 C)  BP (!) 145/57  Pulse Rate 83  Resp 20  Type of Weight Post-Dialysis  Oxygen  Therapy  SpO2 96 %  O2 Device Room Air  Patient Activity (if Appropriate) In bed  Pulse Oximetry Type Continuous  Post Treatment  Dialyzer Clearance Lightly streaked  Liters Processed 64.9  Fluid Removed (mL) 800 mL  Tolerated HD Treatment Yes  AVG/AVF Arterial Site Held (minutes) 7 minutes  AVG/AVF Venous Site Held (minutes) 7 minutes    "

## 2024-08-30 ENCOUNTER — Telehealth: Payer: Self-pay | Admitting: Nephrology

## 2024-08-30 NOTE — Telephone Encounter (Signed)
 Transition of Care - Initial Contact after Hospitalization  Date of discharge:  08/27/2024 Date of contact: 08/30/2024  Method: Phone Spoke to: Patient  Patient contacted to discuss transition of care from recent inpatient hospitalization. Patient was admitted to [facility] from 05/19/24-08/27/24 with discharge diagnosis of acute intracranial hemorrhage s/p VP shunt   The discharge medication list was reviewed. Patient understands the changes  Patient will return to his/her outpatient HD unit on: Did well with dialysis Friday and will return on Sunday per holiday schedule.   No other concerns at this time.

## 2024-09-01 ENCOUNTER — Other Ambulatory Visit (HOSPITAL_COMMUNITY): Payer: Self-pay

## 2024-09-01 ENCOUNTER — Telehealth: Payer: Self-pay

## 2024-09-01 DIAGNOSIS — I619 Nontraumatic intracerebral hemorrhage, unspecified: Secondary | ICD-10-CM

## 2024-09-01 NOTE — Transitions of Care (Post Inpatient/ED Visit) (Signed)
 Stroke Discharge Follow-up   09/01/2024 Name:  [name] [name] MRN:  [id number] DOB:  12-31-1980  Subjective: [name] [name] is a 43 y.o. year old female who is a primary care patient of [name] An Emmi alert was received indicating patient responded to questions: Filled new prescriptions? Problems refilling medications? Questions/problems with meds? Scheduled a follow-up appointment?. I reached out by phone to follow up on the alert and spoke to Patient.  Care Management Interventions:  Spoke with [facility] Pharmacist [place].   Cell cept was dispensed as a suspension,  Patient is missing Selevamer:   Spoke with [facility] Chief Strategy Officer)  states RX can not be filled as it states filled at facility.  Placed call to [facility]) spoke with Burnard RN who will work on this tomorrow. She is aware that patient has not had this RX since hospital discharge.  Patient confirms that she has all her other medications except Selevamer.  Patient notified to discuss this with the dialysis center tomorrow.   Follow up appointments: Patient called PCP and was told that she could walk in to be seen at any time.  Reviewed importance of follow up with PCP ASAP.[name]    Follow up plan: No further intervention required.   Alan Ee, RN, BSN, CEN [employer]- Transition of Care Team.  [facility] [phone]

## 2024-09-11 ENCOUNTER — Telehealth: Payer: Self-pay

## 2024-09-11 DIAGNOSIS — I619 Nontraumatic intracerebral hemorrhage, unspecified: Secondary | ICD-10-CM

## 2024-09-11 NOTE — Transitions of Care (Post Inpatient/ED Visit) (Signed)
 Stroke Discharge Follow-up   09/11/2024 Name:  [name] [name] MRN:  [id number] DOB:  Sep 10, 1980  Subjective: [name] [name] is a 44 y.o. year old female who is a primary care patient of [name] An Emmi alert was received indicating patient responded to questions: Scheduled a follow-up appointment? Went to follow-up appointment?. I reached out by phone to follow up on the alert and spoke to no one- unsuccessful outreach attempt.  Care Management Interventions: attempted outreach to address red flag. No answer.   Follow up plan: will continue to attempt to reach patient. Alan Ee, RN, BSN, CEN [employer]- Transition of Care Team.  [facility] [phone]

## 2024-09-12 ENCOUNTER — Telehealth: Payer: Self-pay

## 2024-09-12 NOTE — Transitions of Care (Post Inpatient/ED Visit) (Signed)
 Stroke Discharge Follow-up   09/12/2024 Name:  Evian Derringer MRN:  [id number] DOB:  [birth year]  Subjective: [name] [name] is a 44 y.o. year old female who is a primary care patient of [name] An Emmi alert was received indicating patient responded to questions: Scheduled a follow-up appointment? Went to follow-up appointment?. I reached out by phone to follow up on the alert and spoke to no answer after 3 attempts..  Care Management Interventions: attempted to reach patient x 3 without success.  Follow up plan: No further intervention required.   Unsuccessful outreach attempts X3- no additional attempts will be made unless an additional emmi red flag notification.  Alan Ee, RN, BSN, CEN [employer]- Transition of Care Team.  [facility] [phone]

## 2024-09-12 NOTE — Transitions of Care (Post Inpatient/ED Visit) (Signed)
 Stroke Discharge Follow-up   09/12/2024 Name:  [name] [name] MRN:  [id number] DOB:  [birth year]  Subjective: [name] [name] is a 44 y.o. year old female who is a primary care patient of [name] An Emmi alert was received indicating patient responded to questions: Scheduled a follow-up appointment? Went to follow-up appointment?. I reached out by phone to follow up on the alert and spoke to no answer..  Care Management Interventions: 2nd attempt to reach patient about emmi red flag- unsuccessful  Follow up plan: will attempt again to reach patient.  Alan Ee, RN, BSN, CEN [employer]- Transition of Care Team.  [facility] [phone]

## 2024-09-16 ENCOUNTER — Other Ambulatory Visit (HOSPITAL_COMMUNITY): Payer: Self-pay | Admitting: Nephrology

## 2024-09-16 DIAGNOSIS — Z992 Dependence on renal dialysis: Secondary | ICD-10-CM

## 2024-09-18 ENCOUNTER — Ambulatory Visit (HOSPITAL_COMMUNITY)
Admission: RE | Admit: 2024-09-18 | Discharge: 2024-09-18 | Disposition: A | Discharge status: Home / Self Care | Source: Ambulatory Visit | Attending: Nephrology | Admitting: Nephrology

## 2024-09-18 DIAGNOSIS — N186 End stage renal disease: Secondary | ICD-10-CM | POA: Insufficient documentation

## 2024-09-18 DIAGNOSIS — Z431 Encounter for attention to gastrostomy: Secondary | ICD-10-CM | POA: Diagnosis present

## 2024-09-18 DIAGNOSIS — Z992 Dependence on renal dialysis: Secondary | ICD-10-CM | POA: Diagnosis not present

## 2024-09-18 HISTORY — PX: IR GASTROSTOMY TUBE REMOVAL: IMG5492

## 2024-09-23 ENCOUNTER — Other Ambulatory Visit (HOSPITAL_COMMUNITY): Payer: Self-pay

## 2024-10-31 ENCOUNTER — Ambulatory Visit: Admitting: Physician Assistant
